# Patient Record
Sex: Female | Born: 1940 | Race: Black or African American | Hispanic: No
Health system: Southern US, Academic
[De-identification: ages and names within clinical notes are randomized; demographics above are authoritative.]

## PROBLEM LIST (undated history)

## (undated) ENCOUNTER — Encounter: Attending: Adult Health | Primary: Adult Health

## (undated) ENCOUNTER — Encounter

## (undated) ENCOUNTER — Ambulatory Visit: Payer: MEDICARE

## (undated) ENCOUNTER — Ambulatory Visit

## (undated) ENCOUNTER — Encounter: Attending: Cardiovascular Disease | Primary: Cardiovascular Disease

## (undated) ENCOUNTER — Ambulatory Visit: Payer: Medicare (Managed Care)

## (undated) ENCOUNTER — Telehealth

## (undated) ENCOUNTER — Ambulatory Visit: Attending: Clinical | Primary: Clinical

## (undated) ENCOUNTER — Ambulatory Visit: Payer: MEDICARE | Attending: Neurology | Primary: Neurology

## (undated) ENCOUNTER — Ambulatory Visit: Payer: Medicare (Managed Care) | Attending: Adult Health | Primary: Adult Health

## (undated) ENCOUNTER — Ambulatory Visit
Payer: MEDICARE | Attending: Pharmacist Clinician (PhC)/ Clinical Pharmacy Specialist | Primary: Pharmacist Clinician (PhC)/ Clinical Pharmacy Specialist

## (undated) ENCOUNTER — Ambulatory Visit: Payer: MEDICARE | Attending: Family | Primary: Family

## (undated) ENCOUNTER — Inpatient Hospital Stay

## (undated) ENCOUNTER — Encounter: Attending: Family | Primary: Family

## (undated) ENCOUNTER — Telehealth: Attending: Adult Health | Primary: Adult Health

## (undated) ENCOUNTER — Ambulatory Visit: Attending: Nurse Practitioner | Primary: Nurse Practitioner

## (undated) ENCOUNTER — Other Ambulatory Visit

## (undated) ENCOUNTER — Encounter: Attending: Internal Medicine | Primary: Internal Medicine

## (undated) ENCOUNTER — Ambulatory Visit: Payer: MEDICARE | Attending: Nephrology | Primary: Nephrology

## (undated) ENCOUNTER — Encounter
Attending: Student in an Organized Health Care Education/Training Program | Primary: Student in an Organized Health Care Education/Training Program

## (undated) ENCOUNTER — Ambulatory Visit: Payer: MEDICARE | Attending: Adult Health | Primary: Adult Health

## (undated) ENCOUNTER — Encounter: Attending: Pharmacist | Primary: Pharmacist

## (undated) ENCOUNTER — Ambulatory Visit
Payer: MEDICARE | Attending: Student in an Organized Health Care Education/Training Program | Primary: Student in an Organized Health Care Education/Training Program

## (undated) ENCOUNTER — Ambulatory Visit: Attending: Surgery | Primary: Surgery

## (undated) ENCOUNTER — Telehealth: Attending: Internal Medicine | Primary: Internal Medicine

## (undated) ENCOUNTER — Ambulatory Visit: Payer: Medicare (Managed Care) | Attending: Pulmonary Disease | Primary: Pulmonary Disease

## (undated) ENCOUNTER — Encounter: Attending: Pulmonary Disease | Primary: Pulmonary Disease

## (undated) ENCOUNTER — Encounter: Attending: Nurse Practitioner | Primary: Nurse Practitioner

## (undated) ENCOUNTER — Encounter: Attending: Mental Health | Primary: Mental Health

## (undated) ENCOUNTER — Encounter
Attending: Pharmacist Clinician (PhC)/ Clinical Pharmacy Specialist | Primary: Pharmacist Clinician (PhC)/ Clinical Pharmacy Specialist

## (undated) ENCOUNTER — Ambulatory Visit: Payer: MEDICARE | Attending: Cardiovascular Disease | Primary: Cardiovascular Disease

## (undated) ENCOUNTER — Ambulatory Visit: Payer: MEDICARE | Attending: Urology | Primary: Urology

## (undated) ENCOUNTER — Ambulatory Visit: Attending: Mental Health | Primary: Mental Health

## (undated) DIAGNOSIS — E119 Type 2 diabetes mellitus without complications: Secondary | ICD-10-CM

## (undated) DIAGNOSIS — J449 Chronic obstructive pulmonary disease, unspecified: Secondary | ICD-10-CM

## (undated) DIAGNOSIS — I5030 Unspecified diastolic (congestive) heart failure: Secondary | ICD-10-CM

## (undated) DIAGNOSIS — I10 Essential (primary) hypertension: Secondary | ICD-10-CM

## (undated) DIAGNOSIS — E785 Hyperlipidemia, unspecified: Secondary | ICD-10-CM

## (undated) DIAGNOSIS — I509 Heart failure, unspecified: Secondary | ICD-10-CM

## (undated) HISTORY — DX: Type 2 diabetes mellitus without complications: E11.9

## (undated) HISTORY — DX: Hyperlipidemia, unspecified: E78.5

## (undated) HISTORY — PX: SPINE SURGERY: SHX786

## (undated) HISTORY — DX: Essential (primary) hypertension: I10

## (undated) HISTORY — DX: Chronic obstructive pulmonary disease, unspecified: J44.9

## (undated) HISTORY — PX: ABDOMINAL HYSTERECTOMY: SHX81

## (undated) MED ORDER — ACETAMINOPHEN 500 MG TABLET: Freq: Every day | ORAL | 0.00000 days | PRN

## (undated) MED ORDER — METFORMIN 500 MG TABLET
Freq: Two times a day (BID) | ORAL | 0 days
Start: ? — End: 2021-01-29

## (undated) MED ORDER — OXYGEN-AIR DELIVERY SYSTEMS MISC: 0 days

---
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
## 2007-10-08 ENCOUNTER — Inpatient Hospital Stay: Payer: Self-pay | Admitting: Internal Medicine

## 2008-01-26 ENCOUNTER — Ambulatory Visit: Payer: Self-pay | Admitting: Internal Medicine

## 2008-03-07 ENCOUNTER — Ambulatory Visit: Payer: Self-pay | Admitting: Internal Medicine

## 2008-03-20 ENCOUNTER — Ambulatory Visit: Payer: Self-pay | Admitting: Urology

## 2008-04-10 ENCOUNTER — Ambulatory Visit: Payer: Self-pay | Admitting: Urology

## 2008-04-11 ENCOUNTER — Ambulatory Visit: Payer: Self-pay | Admitting: Urology

## 2008-04-25 ENCOUNTER — Ambulatory Visit: Payer: Self-pay | Admitting: Urology

## 2008-04-26 ENCOUNTER — Other Ambulatory Visit: Payer: Self-pay

## 2008-04-26 ENCOUNTER — Ambulatory Visit: Payer: Self-pay | Admitting: Urology

## 2008-05-10 ENCOUNTER — Ambulatory Visit: Payer: Self-pay | Admitting: Urology

## 2008-05-16 ENCOUNTER — Ambulatory Visit: Payer: Self-pay | Admitting: Urology

## 2008-10-01 ENCOUNTER — Ambulatory Visit: Payer: Self-pay | Admitting: Internal Medicine

## 2009-02-03 ENCOUNTER — Emergency Department: Payer: Self-pay | Admitting: Emergency Medicine

## 2009-02-04 ENCOUNTER — Emergency Department: Payer: Self-pay

## 2009-02-25 ENCOUNTER — Ambulatory Visit: Payer: Self-pay | Admitting: Internal Medicine

## 2009-04-04 ENCOUNTER — Emergency Department: Payer: Self-pay | Admitting: Emergency Medicine

## 2009-05-31 ENCOUNTER — Ambulatory Visit: Payer: Self-pay | Admitting: Internal Medicine

## 2009-07-04 ENCOUNTER — Ambulatory Visit: Payer: Self-pay | Admitting: Internal Medicine

## 2009-09-03 ENCOUNTER — Ambulatory Visit: Payer: Self-pay | Admitting: Internal Medicine

## 2009-09-27 ENCOUNTER — Ambulatory Visit: Payer: Self-pay | Admitting: Urology

## 2010-02-20 ENCOUNTER — Ambulatory Visit: Payer: Self-pay | Admitting: Urology

## 2010-04-11 ENCOUNTER — Emergency Department: Payer: Self-pay | Admitting: Unknown Physician Specialty

## 2010-05-23 ENCOUNTER — Other Ambulatory Visit: Payer: Self-pay | Admitting: Internal Medicine

## 2010-05-28 ENCOUNTER — Ambulatory Visit: Payer: Self-pay | Admitting: Internal Medicine

## 2010-09-23 ENCOUNTER — Ambulatory Visit: Payer: Self-pay | Admitting: General Practice

## 2010-12-30 ENCOUNTER — Ambulatory Visit: Payer: Self-pay

## 2011-04-06 ENCOUNTER — Ambulatory Visit: Payer: Self-pay | Admitting: Internal Medicine

## 2011-06-08 ENCOUNTER — Ambulatory Visit: Payer: Self-pay

## 2011-09-09 ENCOUNTER — Ambulatory Visit: Payer: Self-pay | Admitting: Family Medicine

## 2011-09-14 ENCOUNTER — Emergency Department: Payer: Self-pay | Admitting: Emergency Medicine

## 2012-04-15 ENCOUNTER — Ambulatory Visit: Payer: Self-pay | Admitting: Internal Medicine

## 2012-05-20 ENCOUNTER — Emergency Department: Payer: Self-pay | Admitting: Unknown Physician Specialty

## 2012-05-20 LAB — CBC
HCT: 42.6 % (ref 35.0–47.0)
HGB: 13.7 g/dL (ref 12.0–16.0)
MCH: 30.3 pg (ref 26.0–34.0)
MCHC: 32.2 g/dL (ref 32.0–36.0)
MCV: 94 fL (ref 80–100)
Platelet: 204 10*3/uL (ref 150–440)
WBC: 12.4 10*3/uL — ABNORMAL HIGH (ref 3.6–11.0)

## 2012-05-20 LAB — COMPREHENSIVE METABOLIC PANEL
Albumin: 3.5 g/dL (ref 3.4–5.0)
Alkaline Phosphatase: 54 U/L (ref 50–136)
BUN: 18 mg/dL (ref 7–18)
Bilirubin,Total: 0.4 mg/dL (ref 0.2–1.0)
Chloride: 105 mmol/L (ref 98–107)
Co2: 27 mmol/L (ref 21–32)
Creatinine: 0.59 mg/dL — ABNORMAL LOW (ref 0.60–1.30)
EGFR (African American): >60
Glucose: 182 mg/dL — ABNORMAL HIGH (ref 65–99)
SGOT(AST): 23 U/L (ref 15–37)
Total Protein: 7.1 g/dL (ref 6.4–8.2)

## 2012-05-20 LAB — TROPONIN I: Troponin-I: <0.02 ng/mL

## 2012-05-21 LAB — URINALYSIS, COMPLETE
Glucose,UR: NEGATIVE mg/dL (ref 0–75)
Nitrite: NEGATIVE
Ph: 7 (ref 4.5–8.0)
Protein: NEGATIVE
RBC,UR: 1 /HPF (ref 0–5)
Specific Gravity: 1.006 (ref 1.003–1.030)
Squamous Epithelial: <1

## 2012-09-15 ENCOUNTER — Emergency Department: Payer: Self-pay | Admitting: Emergency Medicine

## 2012-09-22 ENCOUNTER — Ambulatory Visit: Payer: Self-pay

## 2012-09-22 LAB — URINALYSIS, COMPLETE
Nitrite: POSITIVE
Ph: 6 (ref 4.5–8.0)
Protein: NEGATIVE
Specific Gravity: 1.015 (ref 1.003–1.030)
WBC UR: >30 /HPF (ref 0–5)

## 2012-09-24 LAB — URINE CULTURE

## 2012-10-03 ENCOUNTER — Ambulatory Visit: Payer: Self-pay

## 2013-01-02 ENCOUNTER — Inpatient Hospital Stay: Payer: Self-pay | Admitting: Internal Medicine

## 2013-01-02 LAB — COMPREHENSIVE METABOLIC PANEL
Albumin: 3.4 g/dL (ref 3.4–5.0)
Alkaline Phosphatase: 69 U/L (ref 50–136)
Anion Gap: 4 — ABNORMAL LOW (ref 7–16)
BUN: 21 mg/dL — ABNORMAL HIGH (ref 7–18)
Calcium, Total: 8.3 mg/dL — ABNORMAL LOW (ref 8.5–10.1)
Chloride: 100 mmol/L (ref 98–107)
Co2: 31 mmol/L (ref 21–32)
EGFR (African American): >60
EGFR (Non-African Amer.): >60
Glucose: 202 mg/dL — ABNORMAL HIGH (ref 65–99)
Osmolality: 279 (ref 275–301)
SGOT(AST): 22 U/L (ref 15–37)

## 2013-01-02 LAB — CBC
HGB: 13.3 g/dL (ref 12.0–16.0)
MCH: 29.8 pg (ref 26.0–34.0)
Platelet: 124 10*3/uL — ABNORMAL LOW (ref 150–440)
RBC: 4.47 10*6/uL (ref 3.80–5.20)
RDW: 13.4 % (ref 11.5–14.5)

## 2013-01-02 LAB — URINALYSIS, COMPLETE
Glucose,UR: NEGATIVE mg/dL (ref 0–75)
Hyaline Cast: 4
Leukocyte Esterase: NEGATIVE
Protein: 30
RBC,UR: 1 /HPF (ref 0–5)
WBC UR: 2 /HPF (ref 0–5)

## 2013-01-02 LAB — RAPID INFLUENZA A&B ANTIGENS

## 2013-01-03 LAB — CBC WITH DIFFERENTIAL/PLATELET
Basophil %: 0.4 %
Eosinophil %: 0 %
Lymphocyte #: 0.5 10*3/uL — ABNORMAL LOW (ref 1.0–3.6)
Lymphocyte %: 6 %
MCHC: 32.7 g/dL (ref 32.0–36.0)
MCV: 93 fL (ref 80–100)
Monocyte #: 0.2 x10 3/mm (ref 0.2–0.9)
Monocyte %: 2 %
Neutrophil %: 91.6 %
RBC: 4.03 10*6/uL (ref 3.80–5.20)
WBC: 7.9 10*3/uL (ref 3.6–11.0)

## 2013-01-03 LAB — BASIC METABOLIC PANEL
Anion Gap: 5 — ABNORMAL LOW (ref 7–16)
Calcium, Total: 8.5 mg/dL (ref 8.5–10.1)
Chloride: 98 mmol/L (ref 98–107)
EGFR (African American): 52 — ABNORMAL LOW
Glucose: 234 mg/dL — ABNORMAL HIGH (ref 65–99)
Osmolality: 283 (ref 275–301)
Potassium: 4.7 mmol/L (ref 3.5–5.1)
Sodium: 134 mmol/L — ABNORMAL LOW (ref 136–145)

## 2013-01-03 LAB — TROPONIN I: Troponin-I: <0.02 ng/mL

## 2013-01-03 LAB — LIPID PANEL
Cholesterol: 160 mg/dL (ref 0–200)
Triglycerides: 68 mg/dL (ref 0–200)

## 2013-01-05 LAB — BASIC METABOLIC PANEL
Anion Gap: 5 — ABNORMAL LOW (ref 7–16)
BUN: 26 mg/dL — ABNORMAL HIGH (ref 7–18)
Chloride: 104 mmol/L (ref 98–107)
Co2: 31 mmol/L (ref 21–32)
EGFR (African American): >60
Glucose: 125 mg/dL — ABNORMAL HIGH (ref 65–99)

## 2013-01-06 LAB — BASIC METABOLIC PANEL
Anion Gap: 4 — ABNORMAL LOW (ref 7–16)
Calcium, Total: 8.5 mg/dL (ref 8.5–10.1)
Co2: 33 mmol/L — ABNORMAL HIGH (ref 21–32)
Creatinine: 0.73 mg/dL (ref 0.60–1.30)
Glucose: 85 mg/dL (ref 65–99)
Potassium: 3.9 mmol/L (ref 3.5–5.1)

## 2013-01-08 LAB — CULTURE, BLOOD (SINGLE)

## 2013-03-20 ENCOUNTER — Ambulatory Visit: Payer: Self-pay | Admitting: Family Medicine

## 2013-04-11 ENCOUNTER — Ambulatory Visit: Payer: Self-pay | Admitting: Orthopedic Surgery

## 2013-05-26 ENCOUNTER — Ambulatory Visit: Payer: Self-pay | Admitting: Orthopedic Surgery

## 2013-10-02 ENCOUNTER — Emergency Department: Payer: Self-pay | Admitting: Emergency Medicine

## 2013-10-02 LAB — COMPREHENSIVE METABOLIC PANEL
Albumin: 3.5 g/dL (ref 3.4–5.0)
Alkaline Phosphatase: 79 U/L (ref 50–136)
Anion Gap: 3 — ABNORMAL LOW (ref 7–16)
BUN: 18 mg/dL (ref 7–18)
Bilirubin,Total: 0.5 mg/dL (ref 0.2–1.0)
Calcium, Total: 9.4 mg/dL (ref 8.5–10.1)
Chloride: 104 mmol/L (ref 98–107)
Co2: 31 mmol/L (ref 21–32)
Creatinine: 1.04 mg/dL (ref 0.60–1.30)
Glucose: 159 mg/dL — ABNORMAL HIGH (ref 65–99)
Potassium: 3.8 mmol/L (ref 3.5–5.1)

## 2013-10-02 LAB — CBC
Platelet: 218 10*3/uL (ref 150–440)
RBC: 4.63 10*6/uL (ref 3.80–5.20)
RDW: 13.5 % (ref 11.5–14.5)
WBC: 12.9 10*3/uL — ABNORMAL HIGH (ref 3.6–11.0)

## 2013-10-02 LAB — TROPONIN I: Troponin-I: <0.02 ng/mL

## 2013-10-02 LAB — CK TOTAL AND CKMB (NOT AT ARMC): CK-MB: <0.5 ng/mL — ABNORMAL LOW (ref 0.5–3.6)

## 2013-10-20 ENCOUNTER — Ambulatory Visit: Payer: Self-pay | Admitting: Family Medicine

## 2014-02-11 ENCOUNTER — Ambulatory Visit: Payer: Self-pay | Admitting: Family Medicine

## 2014-03-13 DIAGNOSIS — I200 Unstable angina: Secondary | ICD-10-CM | POA: Insufficient documentation

## 2014-05-11 DIAGNOSIS — M47817 Spondylosis without myelopathy or radiculopathy, lumbosacral region: Secondary | ICD-10-CM | POA: Insufficient documentation

## 2014-05-11 DIAGNOSIS — M5116 Intervertebral disc disorders with radiculopathy, lumbar region: Secondary | ICD-10-CM | POA: Insufficient documentation

## 2014-05-11 DIAGNOSIS — M5414 Radiculopathy, thoracic region: Secondary | ICD-10-CM | POA: Insufficient documentation

## 2014-05-11 DIAGNOSIS — M47816 Spondylosis without myelopathy or radiculopathy, lumbar region: Secondary | ICD-10-CM | POA: Insufficient documentation

## 2014-05-11 DIAGNOSIS — M48061 Spinal stenosis, lumbar region without neurogenic claudication: Secondary | ICD-10-CM | POA: Insufficient documentation

## 2014-05-11 DIAGNOSIS — M5417 Radiculopathy, lumbosacral region: Secondary | ICD-10-CM

## 2014-06-08 ENCOUNTER — Ambulatory Visit: Payer: Self-pay | Admitting: Family Medicine

## 2014-06-08 LAB — URINALYSIS, COMPLETE
BILIRUBIN, UR: NEGATIVE
Blood: NEGATIVE
Glucose,UR: NEGATIVE mg/dL (ref 0–75)
Ketone: NEGATIVE
LEUKOCYTE ESTERASE: NEGATIVE
Nitrite: NEGATIVE
PH: 6.5 (ref 4.5–8.0)
PROTEIN: NEGATIVE
Specific Gravity: 1.01 (ref 1.003–1.030)

## 2014-06-08 LAB — WET PREP, GENITAL

## 2014-06-14 ENCOUNTER — Ambulatory Visit: Payer: Self-pay | Admitting: Urgent Care

## 2014-06-14 LAB — COMPREHENSIVE METABOLIC PANEL
ALBUMIN: 3.7 g/dL (ref 3.4–5.0)
ALK PHOS: 70 U/L
ALT: 16 U/L (ref 12–78)
ANION GAP: 5 — AB (ref 7–16)
BILIRUBIN TOTAL: 0.4 mg/dL (ref 0.2–1.0)
BUN: 23 mg/dL — ABNORMAL HIGH (ref 7–18)
CHLORIDE: 104 mmol/L (ref 98–107)
Calcium, Total: 9.1 mg/dL (ref 8.5–10.1)
Co2: 30 mmol/L (ref 21–32)
Creatinine: 1 mg/dL (ref 0.60–1.30)
EGFR (African American): >60
EGFR (Non-African Amer.): 56 — ABNORMAL LOW
GLUCOSE: 99 mg/dL (ref 65–99)
Osmolality: 281 (ref 275–301)
Potassium: 4 mmol/L (ref 3.5–5.1)
SGOT(AST): 22 U/L (ref 15–37)
SODIUM: 139 mmol/L (ref 136–145)
Total Protein: 7.7 g/dL (ref 6.4–8.2)

## 2014-06-14 LAB — CBC WITH DIFFERENTIAL/PLATELET
Basophil #: 0.1 10*3/uL (ref 0.0–0.1)
Basophil %: 1.1 %
EOS ABS: 0.5 10*3/uL (ref 0.0–0.7)
EOS PCT: 4.2 %
HCT: 41.4 % (ref 35.0–47.0)
HGB: 13.5 g/dL (ref 12.0–16.0)
LYMPHS ABS: 3.1 10*3/uL (ref 1.0–3.6)
LYMPHS PCT: 24.7 %
MCH: 30.3 pg (ref 26.0–34.0)
MCHC: 32.7 g/dL (ref 32.0–36.0)
MCV: 93 fL (ref 80–100)
MONOS PCT: 7.2 %
Monocyte #: 0.9 x10 3/mm (ref 0.2–0.9)
NEUTROS PCT: 62.8 %
Neutrophil #: 7.8 10*3/uL — ABNORMAL HIGH (ref 1.4–6.5)
Platelet: 198 10*3/uL (ref 150–440)
RBC: 4.47 10*6/uL (ref 3.80–5.20)
RDW: 13.4 % (ref 11.5–14.5)
WBC: 12.4 10*3/uL — ABNORMAL HIGH (ref 3.6–11.0)

## 2014-06-14 LAB — LIPASE, BLOOD: Lipase: 280 U/L (ref 73–393)

## 2014-06-15 ENCOUNTER — Emergency Department: Payer: Self-pay | Admitting: Emergency Medicine

## 2014-06-15 LAB — URINALYSIS, COMPLETE
BLOOD: NEGATIVE
Bilirubin,UR: NEGATIVE
Glucose,UR: NEGATIVE mg/dL (ref 0–75)
Hyaline Cast: 16
KETONE: NEGATIVE
Nitrite: NEGATIVE
Ph: 5 (ref 4.5–8.0)
Protein: NEGATIVE
SPECIFIC GRAVITY: 1.011 (ref 1.003–1.030)
Squamous Epithelial: 2
WBC UR: 1 /HPF (ref 0–5)

## 2014-06-16 LAB — URINE CULTURE

## 2014-06-17 LAB — URINE CULTURE

## 2014-07-14 ENCOUNTER — Emergency Department: Payer: Self-pay | Admitting: Emergency Medicine

## 2014-07-14 LAB — COMPREHENSIVE METABOLIC PANEL
ALBUMIN: 3.2 g/dL — AB (ref 3.4–5.0)
AST: 18 U/L (ref 15–37)
Alkaline Phosphatase: 66 U/L
Anion Gap: 6 — ABNORMAL LOW (ref 7–16)
BUN: 26 mg/dL — ABNORMAL HIGH (ref 7–18)
Bilirubin,Total: 1 mg/dL (ref 0.2–1.0)
CALCIUM: 8.7 mg/dL (ref 8.5–10.1)
Chloride: 104 mmol/L (ref 98–107)
Co2: 28 mmol/L (ref 21–32)
Creatinine: 1.68 mg/dL — ABNORMAL HIGH (ref 0.60–1.30)
EGFR (African American): 35 — ABNORMAL LOW
EGFR (Non-African Amer.): 30 — ABNORMAL LOW
Glucose: 194 mg/dL — ABNORMAL HIGH (ref 65–99)
OSMOLALITY: 286 (ref 275–301)
Potassium: 3.6 mmol/L (ref 3.5–5.1)
SGPT (ALT): 19 U/L (ref 12–78)
Sodium: 138 mmol/L (ref 136–145)
TOTAL PROTEIN: 7.4 g/dL (ref 6.4–8.2)

## 2014-07-14 LAB — URINALYSIS, COMPLETE
BILIRUBIN, UR: NEGATIVE
GLUCOSE, UR: NEGATIVE mg/dL (ref 0–75)
Ketone: NEGATIVE
Nitrite: POSITIVE
PH: 5 (ref 4.5–8.0)
RBC,UR: 13 /HPF (ref 0–5)
Specific Gravity: 1.013 (ref 1.003–1.030)
Squamous Epithelial: 4
WBC UR: 306 /HPF (ref 0–5)

## 2014-07-14 LAB — CBC
HCT: 39.6 % (ref 35.0–47.0)
HGB: 12.6 g/dL (ref 12.0–16.0)
MCH: 29.2 pg (ref 26.0–34.0)
MCHC: 31.8 g/dL — ABNORMAL LOW (ref 32.0–36.0)
MCV: 92 fL (ref 80–100)
Platelet: 158 10*3/uL (ref 150–440)
RBC: 4.32 10*6/uL (ref 3.80–5.20)
RDW: 13.2 % (ref 11.5–14.5)
WBC: 14 10*3/uL — ABNORMAL HIGH (ref 3.6–11.0)

## 2014-07-14 LAB — TROPONIN I: Troponin-I: <0.02 ng/mL

## 2014-07-14 LAB — TSH: THYROID STIMULATING HORM: 0.87 u[IU]/mL

## 2014-07-17 LAB — URINE CULTURE

## 2014-08-01 DIAGNOSIS — M5136 Other intervertebral disc degeneration, lumbar region: Secondary | ICD-10-CM | POA: Insufficient documentation

## 2014-09-19 ENCOUNTER — Emergency Department: Payer: Self-pay | Admitting: Emergency Medicine

## 2014-09-19 LAB — URINALYSIS, COMPLETE
BILIRUBIN, UR: NEGATIVE
Blood: NEGATIVE
Glucose,UR: NEGATIVE mg/dL (ref 0–75)
KETONE: NEGATIVE
LEUKOCYTE ESTERASE: NEGATIVE
NITRITE: POSITIVE
PH: 7 (ref 4.5–8.0)
Protein: NEGATIVE
RBC,UR: 11 /HPF (ref 0–5)
SPECIFIC GRAVITY: 1.017 (ref 1.003–1.030)
Squamous Epithelial: 2

## 2014-09-19 LAB — COMPREHENSIVE METABOLIC PANEL
ALBUMIN: 3.3 g/dL — AB (ref 3.4–5.0)
Alkaline Phosphatase: 80 U/L
Anion Gap: 3 — ABNORMAL LOW (ref 7–16)
BUN: 25 mg/dL — AB (ref 7–18)
Bilirubin,Total: 0.4 mg/dL (ref 0.2–1.0)
CALCIUM: 8.7 mg/dL (ref 8.5–10.1)
CHLORIDE: 103 mmol/L (ref 98–107)
CREATININE: 1.16 mg/dL (ref 0.60–1.30)
Co2: 32 mmol/L (ref 21–32)
EGFR (African American): 59 — ABNORMAL LOW
GFR CALC NON AF AMER: 49 — AB
Glucose: 208 mg/dL — ABNORMAL HIGH (ref 65–99)
OSMOLALITY: 286 (ref 275–301)
Potassium: 3.9 mmol/L (ref 3.5–5.1)
SGOT(AST): 12 U/L — ABNORMAL LOW (ref 15–37)
SGPT (ALT): 16 U/L
SODIUM: 138 mmol/L (ref 136–145)
Total Protein: 7.3 g/dL (ref 6.4–8.2)

## 2014-09-19 LAB — CBC
HCT: 42.5 % (ref 35.0–47.0)
HGB: 13.7 g/dL (ref 12.0–16.0)
MCH: 29.3 pg (ref 26.0–34.0)
MCHC: 32.2 g/dL (ref 32.0–36.0)
MCV: 91 fL (ref 80–100)
PLATELETS: 201 10*3/uL (ref 150–440)
RBC: 4.67 10*6/uL (ref 3.80–5.20)
RDW: 13.4 % (ref 11.5–14.5)
WBC: 12.6 10*3/uL — ABNORMAL HIGH (ref 3.6–11.0)

## 2014-09-19 LAB — LIPASE, BLOOD: LIPASE: 244 U/L (ref 73–393)

## 2014-09-21 LAB — URINE CULTURE

## 2014-12-31 DIAGNOSIS — D485 Neoplasm of uncertain behavior of skin: Secondary | ICD-10-CM | POA: Diagnosis not present

## 2014-12-31 DIAGNOSIS — I96 Gangrene, not elsewhere classified: Secondary | ICD-10-CM | POA: Diagnosis not present

## 2015-01-15 DIAGNOSIS — M4807 Spinal stenosis, lumbosacral region: Secondary | ICD-10-CM | POA: Diagnosis not present

## 2015-01-15 DIAGNOSIS — M47816 Spondylosis without myelopathy or radiculopathy, lumbar region: Secondary | ICD-10-CM | POA: Diagnosis not present

## 2015-01-15 DIAGNOSIS — N1330 Unspecified hydronephrosis: Secondary | ICD-10-CM | POA: Diagnosis not present

## 2015-01-15 DIAGNOSIS — R29890 Loss of height: Secondary | ICD-10-CM | POA: Diagnosis not present

## 2015-01-15 DIAGNOSIS — M4317 Spondylolisthesis, lumbosacral region: Secondary | ICD-10-CM | POA: Diagnosis not present

## 2015-01-15 DIAGNOSIS — M4806 Spinal stenosis, lumbar region: Secondary | ICD-10-CM | POA: Diagnosis not present

## 2015-01-16 DIAGNOSIS — I10 Essential (primary) hypertension: Secondary | ICD-10-CM | POA: Diagnosis not present

## 2015-01-16 DIAGNOSIS — E119 Type 2 diabetes mellitus without complications: Secondary | ICD-10-CM | POA: Diagnosis not present

## 2015-01-16 DIAGNOSIS — S32010A Wedge compression fracture of first lumbar vertebra, initial encounter for closed fracture: Secondary | ICD-10-CM | POA: Diagnosis not present

## 2015-01-16 DIAGNOSIS — M5136 Other intervertebral disc degeneration, lumbar region: Secondary | ICD-10-CM | POA: Diagnosis not present

## 2015-01-16 DIAGNOSIS — M4856XA Collapsed vertebra, not elsewhere classified, lumbar region, initial encounter for fracture: Secondary | ICD-10-CM | POA: Diagnosis not present

## 2015-01-16 DIAGNOSIS — J449 Chronic obstructive pulmonary disease, unspecified: Secondary | ICD-10-CM | POA: Diagnosis not present

## 2015-01-16 DIAGNOSIS — Z6837 Body mass index (BMI) 37.0-37.9, adult: Secondary | ICD-10-CM | POA: Diagnosis not present

## 2015-01-16 DIAGNOSIS — M4807 Spinal stenosis, lumbosacral region: Secondary | ICD-10-CM | POA: Diagnosis not present

## 2015-01-16 DIAGNOSIS — M4317 Spondylolisthesis, lumbosacral region: Secondary | ICD-10-CM | POA: Diagnosis not present

## 2015-01-20 DIAGNOSIS — J449 Chronic obstructive pulmonary disease, unspecified: Secondary | ICD-10-CM | POA: Diagnosis not present

## 2015-01-25 DIAGNOSIS — Z1389 Encounter for screening for other disorder: Secondary | ICD-10-CM | POA: Diagnosis not present

## 2015-01-25 DIAGNOSIS — M549 Dorsalgia, unspecified: Secondary | ICD-10-CM | POA: Diagnosis not present

## 2015-01-25 DIAGNOSIS — Z9181 History of falling: Secondary | ICD-10-CM | POA: Diagnosis not present

## 2015-01-25 DIAGNOSIS — I10 Essential (primary) hypertension: Secondary | ICD-10-CM | POA: Diagnosis not present

## 2015-02-04 DIAGNOSIS — M545 Low back pain: Secondary | ICD-10-CM | POA: Diagnosis not present

## 2015-02-04 DIAGNOSIS — M5430 Sciatica, unspecified side: Secondary | ICD-10-CM | POA: Diagnosis not present

## 2015-02-04 DIAGNOSIS — M150 Primary generalized (osteo)arthritis: Secondary | ICD-10-CM | POA: Diagnosis not present

## 2015-02-04 DIAGNOSIS — F1120 Opioid dependence, uncomplicated: Secondary | ICD-10-CM | POA: Diagnosis not present

## 2015-02-04 DIAGNOSIS — E119 Type 2 diabetes mellitus without complications: Secondary | ICD-10-CM | POA: Diagnosis not present

## 2015-02-04 DIAGNOSIS — F17290 Nicotine dependence, other tobacco product, uncomplicated: Secondary | ICD-10-CM | POA: Diagnosis not present

## 2015-02-04 DIAGNOSIS — G8929 Other chronic pain: Secondary | ICD-10-CM | POA: Diagnosis not present

## 2015-02-04 DIAGNOSIS — M5418 Radiculopathy, sacral and sacrococcygeal region: Secondary | ICD-10-CM | POA: Diagnosis not present

## 2015-02-20 DIAGNOSIS — J449 Chronic obstructive pulmonary disease, unspecified: Secondary | ICD-10-CM | POA: Diagnosis not present

## 2015-03-01 DIAGNOSIS — J449 Chronic obstructive pulmonary disease, unspecified: Secondary | ICD-10-CM | POA: Diagnosis not present

## 2015-03-01 DIAGNOSIS — G5791 Unspecified mononeuropathy of right lower limb: Secondary | ICD-10-CM | POA: Diagnosis not present

## 2015-03-01 DIAGNOSIS — F329 Major depressive disorder, single episode, unspecified: Secondary | ICD-10-CM | POA: Diagnosis not present

## 2015-03-01 DIAGNOSIS — I10 Essential (primary) hypertension: Secondary | ICD-10-CM | POA: Diagnosis not present

## 2015-03-01 DIAGNOSIS — J302 Other seasonal allergic rhinitis: Secondary | ICD-10-CM | POA: Diagnosis not present

## 2015-03-01 DIAGNOSIS — B001 Herpesviral vesicular dermatitis: Secondary | ICD-10-CM | POA: Diagnosis not present

## 2015-03-01 DIAGNOSIS — M1990 Unspecified osteoarthritis, unspecified site: Secondary | ICD-10-CM | POA: Diagnosis not present

## 2015-03-01 DIAGNOSIS — E119 Type 2 diabetes mellitus without complications: Secondary | ICD-10-CM | POA: Diagnosis not present

## 2015-03-21 DIAGNOSIS — J449 Chronic obstructive pulmonary disease, unspecified: Secondary | ICD-10-CM | POA: Diagnosis not present

## 2015-04-13 DIAGNOSIS — Z8249 Family history of ischemic heart disease and other diseases of the circulatory system: Secondary | ICD-10-CM | POA: Diagnosis not present

## 2015-04-13 DIAGNOSIS — Z8739 Personal history of other diseases of the musculoskeletal system and connective tissue: Secondary | ICD-10-CM | POA: Diagnosis not present

## 2015-04-13 DIAGNOSIS — I10 Essential (primary) hypertension: Secondary | ICD-10-CM | POA: Diagnosis not present

## 2015-04-13 DIAGNOSIS — R5383 Other fatigue: Secondary | ICD-10-CM | POA: Diagnosis not present

## 2015-04-13 DIAGNOSIS — R600 Localized edema: Secondary | ICD-10-CM | POA: Diagnosis not present

## 2015-04-13 DIAGNOSIS — N179 Acute kidney failure, unspecified: Secondary | ICD-10-CM | POA: Diagnosis not present

## 2015-04-13 DIAGNOSIS — J440 Chronic obstructive pulmonary disease with acute lower respiratory infection: Secondary | ICD-10-CM | POA: Diagnosis not present

## 2015-04-13 DIAGNOSIS — Z87891 Personal history of nicotine dependence: Secondary | ICD-10-CM | POA: Diagnosis not present

## 2015-04-13 DIAGNOSIS — N390 Urinary tract infection, site not specified: Secondary | ICD-10-CM | POA: Diagnosis not present

## 2015-04-13 DIAGNOSIS — G4730 Sleep apnea, unspecified: Secondary | ICD-10-CM | POA: Diagnosis not present

## 2015-04-13 DIAGNOSIS — R0902 Hypoxemia: Secondary | ICD-10-CM | POA: Diagnosis not present

## 2015-04-13 DIAGNOSIS — J9610 Chronic respiratory failure, unspecified whether with hypoxia or hypercapnia: Secondary | ICD-10-CM | POA: Diagnosis not present

## 2015-04-13 DIAGNOSIS — M1990 Unspecified osteoarthritis, unspecified site: Secondary | ICD-10-CM | POA: Diagnosis not present

## 2015-04-13 DIAGNOSIS — R0600 Dyspnea, unspecified: Secondary | ICD-10-CM | POA: Diagnosis not present

## 2015-04-13 DIAGNOSIS — R739 Hyperglycemia, unspecified: Secondary | ICD-10-CM | POA: Diagnosis not present

## 2015-04-13 DIAGNOSIS — J449 Chronic obstructive pulmonary disease, unspecified: Secondary | ICD-10-CM | POA: Diagnosis not present

## 2015-04-13 DIAGNOSIS — J441 Chronic obstructive pulmonary disease with (acute) exacerbation: Secondary | ICD-10-CM | POA: Diagnosis not present

## 2015-04-14 DIAGNOSIS — G4730 Sleep apnea, unspecified: Secondary | ICD-10-CM | POA: Insufficient documentation

## 2015-04-14 DIAGNOSIS — I10 Essential (primary) hypertension: Secondary | ICD-10-CM | POA: Insufficient documentation

## 2015-04-14 DIAGNOSIS — R739 Hyperglycemia, unspecified: Secondary | ICD-10-CM | POA: Insufficient documentation

## 2015-04-14 DIAGNOSIS — J449 Chronic obstructive pulmonary disease, unspecified: Secondary | ICD-10-CM | POA: Insufficient documentation

## 2015-04-14 DIAGNOSIS — S37009A Unspecified injury of unspecified kidney, initial encounter: Secondary | ICD-10-CM | POA: Insufficient documentation

## 2015-04-16 DIAGNOSIS — I10 Essential (primary) hypertension: Secondary | ICD-10-CM | POA: Diagnosis not present

## 2015-04-16 DIAGNOSIS — E119 Type 2 diabetes mellitus without complications: Secondary | ICD-10-CM | POA: Diagnosis not present

## 2015-04-16 DIAGNOSIS — R300 Dysuria: Secondary | ICD-10-CM | POA: Diagnosis not present

## 2015-04-16 DIAGNOSIS — M1990 Unspecified osteoarthritis, unspecified site: Secondary | ICD-10-CM | POA: Diagnosis not present

## 2015-04-16 DIAGNOSIS — Z9181 History of falling: Secondary | ICD-10-CM | POA: Diagnosis not present

## 2015-04-16 DIAGNOSIS — F329 Major depressive disorder, single episode, unspecified: Secondary | ICD-10-CM | POA: Diagnosis not present

## 2015-04-16 DIAGNOSIS — G5791 Unspecified mononeuropathy of right lower limb: Secondary | ICD-10-CM | POA: Diagnosis not present

## 2015-04-16 DIAGNOSIS — J449 Chronic obstructive pulmonary disease, unspecified: Secondary | ICD-10-CM | POA: Diagnosis not present

## 2015-04-19 DIAGNOSIS — I509 Heart failure, unspecified: Secondary | ICD-10-CM | POA: Diagnosis not present

## 2015-04-19 DIAGNOSIS — R0602 Shortness of breath: Secondary | ICD-10-CM | POA: Diagnosis not present

## 2015-04-19 DIAGNOSIS — R0902 Hypoxemia: Secondary | ICD-10-CM | POA: Diagnosis not present

## 2015-04-19 DIAGNOSIS — R011 Cardiac murmur, unspecified: Secondary | ICD-10-CM | POA: Diagnosis not present

## 2015-04-19 NOTE — Op Note (Signed)
PATIENT NAME:  Megan Bolton, Megan Bolton MR#:  [id number] DATE OF BIRTH:  05/29/1941  DATE OF PROCEDURE:  05/26/2013  PREOPERATIVE DIAGNOSES: Left carpal tunnel syndrome and left olecranon bursitis.   POSTOPERATIVE DIAGNOSES: Left carpal tunnel syndrome and left olecranon bursitis.   PROCEDURE: Left carpal tunnel release and left olecranon bursa excision.   ANESTHESIA: General.  SURGEON: Laurene Footman, MD   DESCRIPTION OF PROCEDURE: The patient was brought to the operating room, and after adequate anesthesia was obtained, the left arm was prepped and draped in the usual sterile fashion with a tourniquet applied to the upper arm. After patient identification and timeout procedures were completed, the tourniquet was raised to 250 mmHg. An incision was made in line with the ring metacarpal, approximately 2.5 cm in length, extending from the distal wrist flexion crease. The subcutaneous tissue was spread, and the transverse carpal ligament identified and appeared thickened. This was incised, and the transverse carpal ligament released distally until there was fat noted around the nerve consistent with lack of compression. Going proximally, there was compression at the level of the wrist flexion crease. There was some indentation to the median nerve at this level. After release, there was good vascular blush. Examination of the carpal tunnel revealed no masses. There was moderate flexor tenosynovitis. This wound was irrigated and then closed with simple interrupted 5-0 nylon skin suture. The left olecranon bursa was then approached with an incision to the radial side of the olecranon approximately 4 cm in length. The skin and subcutaneous tissue was divided, and there was extensive bursal tissue present along with a large amount of fluid. The thick bursal tissue was debrided along with granulation tissue that was adherent to the olecranon and surface of the ulna. After adequate debridement of this tissue, the wound  was thoroughly irrigated. A JP drain was placed, and the wound closed with simple interrupted 4-0 nylon skin sutures. Xeroform, 4 x 4's, Webril and Ace wrap were applied to both incisions, wrapping from hand to above the elbow to hopefully minimize postoperative edema. The patient was sent to the recovery room in stable condition.   ESTIMATED BLOOD LOSS: Minimal.   COMPLICATIONS: None.   SPECIMEN: None.   TOURNIQUET TIME: 34 minutes at 250 mmHg.   ____________________________ Laurene Footman, MD mjm:OSi D: 05/26/2013 21:21:59 ET T: 05/27/2013 08:42:17 ET JOB#: [id number]  cc: Laurene Footman, MD, <Dictator> Laurene Footman MD ELECTRONICALLY SIGNED 05/27/2013 21:37

## 2015-04-19 NOTE — Discharge Summary (Signed)
PATIENT NAME:  Megan Bolton, Megan Bolton MR#:  [id number] DATE OF BIRTH:  1941/05/23  DATE OF ADMISSION:  01/02/2013 DATE OF DISCHARGE:  01/06/2013  PRIMARY CARE PHYSICIAN: Lavera Guise, MD  DISCHARGE DIAGNOSES:  1. Acute on chronic respiratory failure.  2. Chronic obstructive pulmonary exacerbation. 3. Influenza A.  3. Systemic inflammatory response syndrome. 4. Dehydration. 5. Hypertension.  6. Diabetes.   CONDITION: Stable.   CODE STATUS: Full code.   MEDICATIONS: Please refer to the medication reconciliation list in [facility] physician discharge instructions. The patient needs home health with nursing and PT.   DIET: Low sodium, low fat, low cholesterol, ADA diet.   ACTIVITY: As tolerated.   FOLLOWUP CARE: Follow-up with PCP within 1 to 2 weeks. The patient needs CPAP at night.   REASON FOR ADMISSION: Shortness of breath.   HOSPITAL COURSE: The patient is a 74 year old female with a history of hypertension, COPD, diabetes, OSA, presenting in ED with shortness of breath for several days with yellowish phlegm. Her oxygen dropped. She was treated with BiPAP in the ED and was found to have a positive influenza A. She had a fever of 103 and elevated white count, so she was admitted for COPD exacerbation, SIRS with influenza A. After admission, the patient was treated with IV Solu-Medrol, Levaquin, nebulizer, Tamiflu. Symptoms have much improved after aforementioned treatment. In addition, the patient's diabetes has been treated with sliding scale plus metformin. Hypertension has been controlled with hypertension medication. For OSA, the patient has been treated with CPAP at night. Briefly, the patient is clinically stable. She will be discharged to home with nursing and physical therapy. I discussed the patient's discharge plan with the patient, the patient's daughter, case Freight forwarder.   TIME SPENT: About 36 minutes.    ____________________________ Demetrios Loll, MD qc:OSi D: 01/06/2013 14:26:18  ET T: 01/07/2013 07:59:00 ET JOB#: [id number]  cc: Demetrios Loll, MD, <Dictator> Demetrios Loll MD ELECTRONICALLY SIGNED 01/09/2013 15:25

## 2015-04-19 NOTE — H&P (Signed)
PATIENT NAME:  Megan Bolton, Megan Bolton MR#:  [id number] DATE OF BIRTH:  Aug 10, 1941  DATE OF ADMISSION:  01/02/2013  PRIMARY CARE PHYSICIAN: Dr. Clayborn Bigness   CHIEF COMPLAINT: Shortness of breath.   HISTORY OF PRESENT ILLNESS: This is a 74 year old female with hypertension, COPD, diabetes, sleep apnea. She presents to the ER with trouble breathing. It has gone for a few days but worsened on Saturday. She felt like she was catching a cold. She has been having fevers, coughing up yellow phlegm, and her oxygen level dropped. In the ER, she was in respiratory failure and started on BiPAP. She was found to have a positive influenza A, an elevated white count, a fever of 103 and hypoxia, and hospitalist services were contacted for evaluation. The patient is difficult to understand for a history secondary to BiPAP.   PAST MEDICAL HISTORY:  Hypertension, COPD, diabetes, sleep apnea with CPAP at night and oxygen at night.   PAST SURGICAL HISTORY:  Kidney stones, hysterectomy and back surgery.   ALLERGIES: No known drug allergies.   MEDICATIONS: As per prescription writer include Advair HFA, alprazolam, Coreg 25 mg twice a day, Celexa 20 mg daily, indapamide 1.25 mg daily, lisinopril 40 mg daily, losartan 100 mg daily, metformin 500 mg twice a day, Naprosyn 500 mg twice a day as needed, potassium chloride 10 mEq twice a day, ProAir HFA orally, Singulair 10 mg at bedtime.   SOCIAL HISTORY: Smokes, 1 pack lasts her 2 days. No alcohol. No drug use. She lives with her daughter.  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   FAMILY HISTORY: Father died at 63 of rectal cancer. Mother died of hypertension  complications. Daughter died of colorectal cancer.   REVIEW OF SYSTEMS: CONSTITUTIONAL: Positive for fever. Positive for chills. Positive for sweats. Positive for fatigue. No weight gain. No weight loss.  EYES: No blurry vision.  EARS, NOSE, MOUTH AND THROAT: No hearing loss. No sore  throat. No difficulty swallowing.  CARDIOVASCULAR: No chest pain. No palpitations.  RESPIRATORY: Positive for shortness of breath. Positive for cough. Positive for wheeze.  GASTROINTESTINAL: No nausea. No vomiting. No abdominal pain. No diarrhea. No constipation. No bright red blood per rectum. No melena.  GENITOURINARY: No burning on urination, no hematuria.  MUSCULOSKELETAL: Positive for joint pains, low back pain.  INTEGUMENT: No rashes or eruptions.  NEUROLOGIC: No fainting or blackouts.  PSYCHIATRIC: Positive for anxiety and depression.  ENDOCRINE: No thyroid problems.  HEMATOLOGIC/LYMPHATIC: No anemia, no easy bruising or bleeding.   PHYSICAL EXAMINATION: VITAL SIGNS: Temperature 103.1, pulse 88, respirations 24, blood pressure 151/67, pulse ox 84% on oxygen.  GENERAL: Positive respiratory distress, using accessory muscles to breathe.  EYES: Conjunctivae and lids normal. Pupils equal, round and reactive to light. Extraocular muscles intact. No nystagmus.  EARS, NOSE, MOUTH AND THROAT: Bilateral tympanic membranes erythematous. Nasal mucosa no erythema. Throat no erythema, no exudate seen. Lips and gums no lesions.  NECK: No JVD. No bruits. No lymphadenopathy. No thyromegaly. No thyroid nodules palpated.  RESPIRATORY: Positive use of accessory muscles to breathe. Poor air entry. Bilateral wheeze and rhonchi throughout entire lung field.  CARDIOVASCULAR SYSTEM: S1 and S2 normal. No gallops, rubs or murmurs heard. Carotid upstroke 2+ bilaterally. No bruits.  EXTREMITIES: Dorsalis pedis pulses 2+ bilaterally. Trace edema of the lower extremity.  ABDOMEN: Soft, nontender. No organomegaly/splenomegaly. Normoactive bowel sounds. No masses felt.  LYMPHATIC: No lymph nodes in the neck.  MUSCULOSKELETAL: Trace edema. No clubbing. No  cyanosis.  SKIN: No rashes or ulcers seen.  NEUROLOGIC: Cranial nerves II through XII grossly intact. Deep tendon reflexes half plus  bilateral lower extremities.   PSYCHIATRIC: The patient is oriented to person, place and time.   LABORATORY AND RADIOLOGICAL DATA: EKG shows normal sinus rhythm, 71 beats per minute. ABG shows a pH of 7.24, pCO2 of 83, pO2 of 69, that is on 40% oxygen, O2 saturation 90.1. Urinalysis 1+ blood. Influenza A positive. White blood cell count 13.0, H and H  13.3 and 41.5, platelet count of 124. Glucose 202, BUN 21, creatinine 0.87, sodium 135, potassium 4.2, chloride 100, CO2 31, calcium 8.3. Liver function tests normal. Looking back at previous laboratory data, platelet count was normal.   ASSESSMENT AND PLAN: 1.  Acute respiratory failure with CO2 retention, BiPAP in order to oxygenate and move air started 10/5, 40% oxygen. We will try to keep out of the ICU even though the patient is critically ill at this point. High risk for intubation.  2.  Systemic inflammatory response syndrome with fever and leukocytosis. ER physician felt the chest x-ray looked like a right lower lobe pneumonia, will wait for radiology report on this. ER physician ordered Levaquin, I will continue that 750 mg IV daily. Follow up blood cultures and sputum cultures.  3.  Influenza positive. Will start Tamiflu 75 mg b.i.d., it will be a 5-day course, first dose tonight.  4.  Chronic obstructive pulmonary disease exacerbation. We will start Solu-Medrol 125 mg IV x 1 and 40 mg IV q.8 hours. Levaquin, DuoNeb nebulizer solution. Continue Advair and Singulair.  5.  Diabetes. The patient is on metformin, will also add sliding scale.  6.  Hypertension. The patient is on numerous blood pressure medications, continue at this time.  7.  Sleep apnea. She wears CPAP at night. Weight loss recommended, BMI is up at 39.1.  8.  Thrombocytopenia. This is new, probably secondary to viral illness. 9.  Tobacco abuse. Smoking cessation counseling done, 3 minutes by me. Nicotine patch applied.   TIME SPENT ON ADMISSION: 55 minutes, the patient critically ill.     ____________________________ Tana Conch. Leslye Peer, MD rjw:cs D: 01/02/2013 18:54:52 ET T: 01/02/2013 19:27:24 ET JOB#: [id number]  cc: Tana Conch. Leslye Peer, MD, <Dictator> Lavera Guise, MD Marisue Brooklyn MD ELECTRONICALLY SIGNED 01/12/2013 20:00

## 2015-04-21 DIAGNOSIS — J449 Chronic obstructive pulmonary disease, unspecified: Secondary | ICD-10-CM | POA: Diagnosis not present

## 2015-04-24 DIAGNOSIS — R0609 Other forms of dyspnea: Secondary | ICD-10-CM | POA: Diagnosis not present

## 2015-04-24 DIAGNOSIS — G4733 Obstructive sleep apnea (adult) (pediatric): Secondary | ICD-10-CM | POA: Diagnosis not present

## 2015-04-24 DIAGNOSIS — J439 Emphysema, unspecified: Secondary | ICD-10-CM | POA: Diagnosis not present

## 2015-04-24 DIAGNOSIS — R0902 Hypoxemia: Secondary | ICD-10-CM | POA: Diagnosis not present

## 2015-04-29 DIAGNOSIS — F1120 Opioid dependence, uncomplicated: Secondary | ICD-10-CM | POA: Diagnosis not present

## 2015-04-29 DIAGNOSIS — M5418 Radiculopathy, sacral and sacrococcygeal region: Secondary | ICD-10-CM | POA: Diagnosis not present

## 2015-04-29 DIAGNOSIS — F17290 Nicotine dependence, other tobacco product, uncomplicated: Secondary | ICD-10-CM | POA: Diagnosis not present

## 2015-04-29 DIAGNOSIS — M5430 Sciatica, unspecified side: Secondary | ICD-10-CM | POA: Diagnosis not present

## 2015-04-29 DIAGNOSIS — M545 Low back pain: Secondary | ICD-10-CM | POA: Diagnosis not present

## 2015-04-29 DIAGNOSIS — G8929 Other chronic pain: Secondary | ICD-10-CM | POA: Diagnosis not present

## 2015-04-29 DIAGNOSIS — Z79891 Long term (current) use of opiate analgesic: Secondary | ICD-10-CM | POA: Diagnosis not present

## 2015-04-29 DIAGNOSIS — M150 Primary generalized (osteo)arthritis: Secondary | ICD-10-CM | POA: Diagnosis not present

## 2015-05-06 DIAGNOSIS — Z79899 Other long term (current) drug therapy: Secondary | ICD-10-CM | POA: Diagnosis not present

## 2015-05-07 DIAGNOSIS — R0602 Shortness of breath: Secondary | ICD-10-CM | POA: Diagnosis not present

## 2015-05-16 DIAGNOSIS — J449 Chronic obstructive pulmonary disease, unspecified: Secondary | ICD-10-CM | POA: Diagnosis not present

## 2015-05-16 DIAGNOSIS — I272 Other secondary pulmonary hypertension: Secondary | ICD-10-CM | POA: Diagnosis not present

## 2015-05-16 DIAGNOSIS — E785 Hyperlipidemia, unspecified: Secondary | ICD-10-CM | POA: Diagnosis not present

## 2015-05-16 DIAGNOSIS — R0602 Shortness of breath: Secondary | ICD-10-CM | POA: Diagnosis not present

## 2015-05-21 DIAGNOSIS — J449 Chronic obstructive pulmonary disease, unspecified: Secondary | ICD-10-CM | POA: Diagnosis not present

## 2015-06-10 ENCOUNTER — Ambulatory Visit: Payer: Self-pay | Admitting: Family Medicine

## 2015-06-11 ENCOUNTER — Encounter: Payer: Self-pay | Admitting: Family Medicine

## 2015-06-11 ENCOUNTER — Ambulatory Visit (INDEPENDENT_AMBULATORY_CARE_PROVIDER_SITE_OTHER): Payer: Commercial Managed Care - HMO | Admitting: Family Medicine

## 2015-06-11 VITALS — BP 104/66 | HR 73 | Temp 98.7°F | Resp 16 | Ht 69.0 in | Wt 227.8 lb

## 2015-06-11 DIAGNOSIS — M1990 Unspecified osteoarthritis, unspecified site: Secondary | ICD-10-CM | POA: Insufficient documentation

## 2015-06-11 DIAGNOSIS — I272 Pulmonary hypertension, unspecified: Secondary | ICD-10-CM | POA: Insufficient documentation

## 2015-06-11 DIAGNOSIS — R5383 Other fatigue: Secondary | ICD-10-CM

## 2015-06-11 DIAGNOSIS — E1140 Type 2 diabetes mellitus with diabetic neuropathy, unspecified: Secondary | ICD-10-CM | POA: Insufficient documentation

## 2015-06-11 DIAGNOSIS — E119 Type 2 diabetes mellitus without complications: Secondary | ICD-10-CM | POA: Diagnosis not present

## 2015-06-11 DIAGNOSIS — E7800 Pure hypercholesterolemia, unspecified: Secondary | ICD-10-CM | POA: Insufficient documentation

## 2015-06-11 LAB — POCT GLYCOSYLATED HEMOGLOBIN (HGB A1C): Hemoglobin A1C: 6.2

## 2015-06-11 MED ORDER — GABAPENTIN 400 MG PO CAPS: 400.0000 mg | ORAL_CAPSULE | Freq: Three times a day (TID) | ORAL | Status: DC

## 2015-06-11 NOTE — Progress Notes (Signed)
Name: Megan Bolton   MRN: [id number]    DOB: 04-03-1941   Date:06/11/2015       Progress Note  Subjective  Chief Complaint  Chief Complaint  Patient presents with  . Fatigue    1 week- Depressive state from feeling so malaise.  . Back Pain    Buttock area Pain   . Diabetes    Checks sugars every other day ranges 110-190    HPI  Here for extreme fatigue and  Muscle aches and weakness.  Pain mainly in bilat. Legs and low back. No injury.  Npo changes a home except on O2 constantly.   Past Medical History  Diagnosis Date  . Diabetes mellitus without complication   . Hypertension   . COPD (chronic obstructive pulmonary disease)   . Hyperlipidemia     Past Surgical History  Procedure Laterality Date  . Abdominal hysterectomy    . Spine surgery      Family History  Problem Relation Age of Onset  . Hypertension Paternal Grandfather     History   Social History  . Marital Status: Widowed    Spouse Name: N/A  . Number of Children: N/A  . Years of Education: N/A   Occupational History  . Not on file.   Social History Main Topics  . Smoking status: Former Smoker -- 0.50 packs/day    Quit date: 04/28/2015  . Smokeless tobacco: Never Used  . Alcohol Use: No  . Drug Use: No  . Sexual Activity: Not on file   Other Topics Concern  . Not on file   Social History Narrative  . No narrative on file     Current outpatient prescriptions:  .  ADVAIR DISKUS 250-50 MCG/DOSE AEPB, , Disp: , Rfl:  .  albuterol (PROAIR HFA) 108 (90 BASE) MCG/ACT inhaler, Inhale into the lungs., Disp: , Rfl:  .  amLODipine (NORVASC) 2.5 MG tablet, , Disp: , Rfl:  .  carvedilol (COREG) 25 MG tablet, , Disp: , Rfl:  .  clonazePAM (KLONOPIN) 0.5 MG tablet, , Disp: , Rfl:  .  furosemide (LASIX) 20 MG tablet, , Disp: , Rfl:  .  indapamide (LOZOL) 1.25 MG tablet, , Disp: , Rfl:  .  lisinopril (PRINIVIL,ZESTRIL) 40 MG tablet, , Disp: , Rfl:  .  metFORMIN (GLUCOPHAGE) 500 MG tablet, , Disp:  , Rfl:  .  montelukast (SINGULAIR) 10 MG tablet, , Disp: , Rfl:  .  oxyCODONE (OXY IR/ROXICODONE) 5 MG immediate release tablet, , Disp: , Rfl:  .  potassium chloride (K-DUR) 10 MEQ tablet, , Disp: , Rfl:  .  SPIRIVA HANDIHALER 18 MCG inhalation capsule, , Disp: , Rfl:  .  valACYclovir (VALTREX) 1000 MG tablet, , Disp: , Rfl:  .  gabapentin (NEURONTIN) 400 MG capsule, Take 1 capsule (400 mg total) by mouth 3 (three) times daily., Disp: 90 capsule, Rfl: 12  Not on File   Review of Systems  Constitutional: Positive for chills and malaise/fatigue. Negative for fever and weight loss.  HENT: Negative.   Eyes: Negative.  Negative for blurred vision and double vision.  Respiratory: Positive for shortness of breath and wheezing (occ.). Negative for cough.   Cardiovascular: Positive for leg swelling. Negative for chest pain, palpitations and orthopnea.  Gastrointestinal: Negative for heartburn, nausea, vomiting, abdominal pain, diarrhea and blood in stool.  Genitourinary: Negative for dysuria, urgency, frequency and hematuria.  Musculoskeletal: Positive for myalgias and joint pain. Back pain: tired.  Skin: Negative.  Negative for  rash.  Neurological: Positive for weakness. Negative for headaches.      Objective  Filed Vitals:   06/11/15 1446  BP: 104/66  Pulse: 73  Temp: 98.7 F (37.1 C)  Resp: 16  Height: 5\' 9"  (1.753 m)  Weight: 227 lb 12.8 oz (103.329 kg)  SpO2: 93%    Physical Exam  Constitutional: She is oriented to person, place, and time. She appears distressed (weakness).  HENT:  Head: Normocephalic and atraumatic.  Eyes: EOM are normal. Pupils are equal, round, and reactive to light. No scleral icterus.  Neck: Normal range of motion. Neck supple. No thyromegaly present.  Cardiovascular: Normal rate, regular rhythm, normal heart sounds and intact distal pulses.  Exam reveals no gallop and no friction rub.   No murmur heard. Pulmonary/Chest: She is in respiratory  distress (mild, on O2). Decreased breath sounds: diffuse.  Abdominal: Soft. Bowel sounds are normal. She exhibits no distension and no mass. There is no tenderness.  Musculoskeletal: She exhibits no edema.  Lymphadenopathy:    She has no cervical adenopathy.  Neurological: She is alert and oriented to person, place, and time.  Psychiatric:  Affect mildly depressed.  Vitals reviewed.      Recent Results (from the past 2160 hour(s))  POCT HgB A1C     Status: Abnormal   Collection Time: 06/11/15  3:08 PM  Result Value Ref Range   Hemoglobin A1C 6.2      Assessment & Plan  Problem List Items Addressed This Visit    None    Visit Diagnoses    Other fatigue    -  Primary    Relevant Orders    CBC    Comprehensive Metabolic Panel (CMET)    TSH    Vitamin D (25 hydroxy)    B12    Type 2 diabetes mellitus without complication        Relevant Medications    lisinopril (PRINIVIL,ZESTRIL) 40 MG tablet    metFORMIN (GLUCOPHAGE) 500 MG tablet    Other Relevant Orders    POCT HgB A1C (Completed)       Meds ordered this encounter  Medications  . amLODipine (NORVASC) 2.5 MG tablet    Sig:   . carvedilol (COREG) 25 MG tablet    Sig:   . clonazePAM (KLONOPIN) 0.5 MG tablet    Sig:   . ADVAIR DISKUS 250-50 MCG/DOSE AEPB    Sig:   . furosemide (LASIX) 20 MG tablet    Sig:   . DISCONTD: gabapentin (NEURONTIN) 600 MG tablet    Sig: Take 400 mg by mouth 3 (three) times daily. Take 1 tablet oraally three times a day.  Marland Kitchen DISCONTD: gabapentin (NEURONTIN) 300 MG capsule    Sig:   . indapamide (LOZOL) 1.25 MG tablet    Sig:   . DISCONTD: levofloxacin (LEVAQUIN) 750 MG tablet    Sig:   . DISCONTD: levofloxacin (LEVAQUIN) 500 MG tablet    Sig:   . lisinopril (PRINIVIL,ZESTRIL) 40 MG tablet    Sig:   . metFORMIN (GLUCOPHAGE) 500 MG tablet    Sig:   . montelukast (SINGULAIR) 10 MG tablet    Sig:   . oxyCODONE (OXY IR/ROXICODONE) 5 MG immediate release tablet    Sig:   .  potassium chloride (K-DUR) 10 MEQ tablet    Sig:   . SPIRIVA HANDIHALER 18 MCG inhalation capsule    Sig:   . valACYclovir (VALTREX) 1000 MG tablet    Sig:   .  albuterol (PROAIR HFA) 108 (90 BASE) MCG/ACT inhaler    Sig: Inhale into the lungs.  . gabapentin (NEURONTIN) 400 MG capsule    Sig: Take 1 capsule (400 mg total) by mouth 3 (three) times daily.    Dispense:  90 capsule    Refill:  12    1. Type 2 diabetes mellitus without complication  - POCT HgB A1C  2. Fatigue-  CBC - Comprehensive Metabolic Panel (CMET) - TSH - Vitamin D (25 hydroxy) - B12 Decrease dose of Gabapentin from 600 mg to 400 mg tid.

## 2015-06-12 DIAGNOSIS — R5383 Other fatigue: Secondary | ICD-10-CM | POA: Diagnosis not present

## 2015-06-12 LAB — CBC
Hematocrit: 38 % (ref 34.0–46.6)
Hemoglobin: 12.3 g/dL (ref 11.1–15.9)
MCH: 28.9 pg (ref 26.6–33.0)
MCHC: 32.4 g/dL (ref 31.5–35.7)
MCV: 89 fL (ref 79–97)
PLATELETS: 216 10*3/uL (ref 150–379)
RBC: 4.25 x10E6/uL (ref 3.77–5.28)
RDW: 13 % (ref 12.3–15.4)
WBC: 9.9 10*3/uL (ref 3.4–10.8)

## 2015-06-13 ENCOUNTER — Telehealth: Payer: Self-pay | Admitting: Family Medicine

## 2015-06-13 LAB — COMPREHENSIVE METABOLIC PANEL
ALT: 9 IU/L (ref 0–32)
AST: 8 IU/L (ref 0–40)
Albumin/Globulin Ratio: 1.5 (ref 1.1–2.5)
Albumin: 3.9 g/dL (ref 3.5–4.8)
Alkaline Phosphatase: 58 IU/L (ref 39–117)
BUN/Creatinine Ratio: 25 (ref 11–26)
BUN: 32 mg/dL — AB (ref 8–27)
Bilirubin Total: 0.3 mg/dL (ref 0.0–1.2)
CALCIUM: 9 mg/dL (ref 8.7–10.3)
CO2: 30 mmol/L — AB (ref 18–29)
CREATININE: 1.26 mg/dL — AB (ref 0.57–1.00)
Chloride: 101 mmol/L (ref 97–108)
GFR calc Af Amer: 49 mL/min/{1.73_m2} — ABNORMAL LOW (ref >59)
GFR calc non Af Amer: 42 mL/min/{1.73_m2} — ABNORMAL LOW (ref >59)
Globulin, Total: 2.6 g/dL (ref 1.5–4.5)
Glucose: 144 mg/dL — ABNORMAL HIGH (ref 65–99)
Potassium: 4.6 mmol/L (ref 3.5–5.2)
Sodium: 143 mmol/L (ref 134–144)
Total Protein: 6.5 g/dL (ref 6.0–8.5)

## 2015-06-13 LAB — TSH: TSH: 1.56 u[IU]/mL (ref 0.450–4.500)

## 2015-06-13 LAB — VITAMIN B12: Vitamin B-12: 453 pg/mL (ref 211–946)

## 2015-06-13 LAB — VITAMIN D 25 HYDROXY (VIT D DEFICIENCY, FRACTURES): VIT D 25 HYDROXY: 17.2 ng/mL — AB (ref 30.0–100.0)

## 2015-06-13 NOTE — Telephone Encounter (Signed)
Advised and will start Vit D.JH

## 2015-06-13 NOTE — Telephone Encounter (Signed)
I am assuming the Rx you sent yesterday did not go through at all. What should I fill? [facility]

## 2015-06-13 NOTE — Telephone Encounter (Signed)
Pt states she called about her prescriptions and was told that it was not received. Pt states Dr. Luan Pulling needs to send Huey Romans a prescription with dx and clinical information fax [phone] and phone number [phone]

## 2015-06-14 ENCOUNTER — Telehealth: Payer: Self-pay | Admitting: Family Medicine

## 2015-06-14 DIAGNOSIS — M5416 Radiculopathy, lumbar region: Secondary | ICD-10-CM | POA: Diagnosis not present

## 2015-06-14 DIAGNOSIS — M5136 Other intervertebral disc degeneration, lumbar region: Secondary | ICD-10-CM | POA: Diagnosis not present

## 2015-06-14 NOTE — Telephone Encounter (Signed)
Spoke to [place] from [place] who states she is unable to schedule the appointment for the patient until she receives the [facility] referral.   Callback number: [phone]

## 2015-06-14 NOTE — Telephone Encounter (Signed)
What does she need filled.  I guess call her pharmacy and figure this out. Vit. D is OTC of course.  And what is that about calling [facility] about something?-jh

## 2015-06-14 NOTE — Telephone Encounter (Signed)
Called and they do not need anything from [place].[facility]

## 2015-06-18 NOTE — Telephone Encounter (Signed)
Done

## 2015-06-19 ENCOUNTER — Other Ambulatory Visit: Payer: Self-pay | Admitting: Family Medicine

## 2015-06-20 ENCOUNTER — Telehealth: Payer: Self-pay | Admitting: Family Medicine

## 2015-06-20 NOTE — Telephone Encounter (Signed)
Not sure how to do this. Please review/ [facility]

## 2015-06-20 NOTE — Telephone Encounter (Signed)
Pt states [facility] needs prescription for POC instead of just portable oxygen.  Phone Number [phone] Fax number [phone]

## 2015-06-21 DIAGNOSIS — J449 Chronic obstructive pulmonary disease, unspecified: Secondary | ICD-10-CM | POA: Diagnosis not present

## 2015-06-21 NOTE — Telephone Encounter (Signed)
I am not sure either.  Need to contact [facility] and ask what they need for POC (?portable oxygen concentrator).-jh

## 2015-06-24 NOTE — Telephone Encounter (Signed)
Pt called wanting an update on the prescription timeline. Pt also provide another number for [facility] if needed to speedup the process. [phone]

## 2015-06-25 ENCOUNTER — Other Ambulatory Visit: Payer: Self-pay

## 2015-06-25 DIAGNOSIS — J438 Other emphysema: Secondary | ICD-10-CM

## 2015-06-25 NOTE — Telephone Encounter (Signed)
Faxed Order [facility]

## 2015-07-04 ENCOUNTER — Other Ambulatory Visit: Payer: Self-pay | Admitting: Family Medicine

## 2015-07-08 ENCOUNTER — Ambulatory Visit: Payer: Commercial Managed Care - HMO | Admitting: Family Medicine

## 2015-07-08 DIAGNOSIS — F1120 Opioid dependence, uncomplicated: Secondary | ICD-10-CM | POA: Diagnosis not present

## 2015-07-08 DIAGNOSIS — Z79899 Other long term (current) drug therapy: Secondary | ICD-10-CM | POA: Diagnosis not present

## 2015-07-08 DIAGNOSIS — M5430 Sciatica, unspecified side: Secondary | ICD-10-CM | POA: Diagnosis not present

## 2015-07-08 DIAGNOSIS — G8929 Other chronic pain: Secondary | ICD-10-CM | POA: Diagnosis not present

## 2015-07-08 DIAGNOSIS — M150 Primary generalized (osteo)arthritis: Secondary | ICD-10-CM | POA: Diagnosis not present

## 2015-07-08 DIAGNOSIS — M545 Low back pain: Secondary | ICD-10-CM | POA: Diagnosis not present

## 2015-07-08 DIAGNOSIS — M5418 Radiculopathy, sacral and sacrococcygeal region: Secondary | ICD-10-CM | POA: Diagnosis not present

## 2015-07-10 ENCOUNTER — Ambulatory Visit: Payer: Self-pay | Admitting: Family Medicine

## 2015-07-10 DIAGNOSIS — J449 Chronic obstructive pulmonary disease, unspecified: Secondary | ICD-10-CM | POA: Diagnosis not present

## 2015-07-10 DIAGNOSIS — J438 Other emphysema: Secondary | ICD-10-CM | POA: Diagnosis not present

## 2015-07-12 ENCOUNTER — Ambulatory Visit (INDEPENDENT_AMBULATORY_CARE_PROVIDER_SITE_OTHER): Payer: Commercial Managed Care - HMO | Admitting: Family Medicine

## 2015-07-12 ENCOUNTER — Ambulatory Visit
Admission: RE | Admit: 2015-07-12 | Discharge: 2015-07-12 | Disposition: A | Discharge status: Home / Self Care | Payer: Commercial Managed Care - HMO | Source: Ambulatory Visit | Attending: Family Medicine | Admitting: Family Medicine

## 2015-07-12 ENCOUNTER — Encounter: Payer: Self-pay | Admitting: Family Medicine

## 2015-07-12 ENCOUNTER — Other Ambulatory Visit: Payer: Self-pay | Admitting: Family Medicine

## 2015-07-12 VITALS — BP 150/70 | HR 65 | Temp 97.8°F | Wt 235.0 lb

## 2015-07-12 DIAGNOSIS — R5382 Chronic fatigue, unspecified: Secondary | ICD-10-CM | POA: Diagnosis not present

## 2015-07-12 DIAGNOSIS — S2232XA Fracture of one rib, left side, initial encounter for closed fracture: Secondary | ICD-10-CM | POA: Diagnosis not present

## 2015-07-12 DIAGNOSIS — X58XXXA Exposure to other specified factors, initial encounter: Secondary | ICD-10-CM | POA: Insufficient documentation

## 2015-07-12 DIAGNOSIS — J449 Chronic obstructive pulmonary disease, unspecified: Secondary | ICD-10-CM | POA: Diagnosis not present

## 2015-07-12 DIAGNOSIS — N189 Chronic kidney disease, unspecified: Secondary | ICD-10-CM | POA: Insufficient documentation

## 2015-07-12 DIAGNOSIS — N184 Chronic kidney disease, stage 4 (severe): Secondary | ICD-10-CM | POA: Insufficient documentation

## 2015-07-12 DIAGNOSIS — N183 Chronic kidney disease, stage 3 (moderate): Secondary | ICD-10-CM | POA: Diagnosis not present

## 2015-07-12 DIAGNOSIS — S298XXA Other specified injuries of thorax, initial encounter: Secondary | ICD-10-CM

## 2015-07-12 DIAGNOSIS — S299XXA Unspecified injury of thorax, initial encounter: Secondary | ICD-10-CM

## 2015-07-12 DIAGNOSIS — I10 Essential (primary) hypertension: Secondary | ICD-10-CM

## 2015-07-12 NOTE — Patient Instructions (Addendum)
Continue all current meds.  Recheck BMP on return

## 2015-07-12 NOTE — Progress Notes (Signed)
Name: Megan Bolton   MRN: [id number]    DOB: 03-24-1941   Date:07/12/2015       Progress Note  Subjective  Chief Complaint  Chief Complaint  Patient presents with  . Follow-up    1 month  . Fatigue  . Rib Pain    more rib pain, patient states that she was cleaning out chest freezer and felt "a bone rolled over", now she states she is a great deal of pain worse with breathing    HPI  C/o L. Rib care pain sec. too injury from bending over a freezes.  Felt pain and L. Rib injury. Energy is improved.  Has started Vit. D as recommended. Her pain med MD has increased her oxycodone to 10 mg every 8 hourfs.  Past Medical History  Diagnosis Date  . Diabetes mellitus without complication   . Hypertension   . COPD (chronic obstructive pulmonary disease)   . Hyperlipidemia     History  Substance Use Topics  . Smoking status: Former Smoker -- 0.50 packs/day    Quit date: 04/28/2015  . Smokeless tobacco: Never Used  . Alcohol Use: No      No Known Allergies  Review of Systems  Constitutional: Positive for malaise/fatigue (improving). Negative for fever and chills.  Respiratory: Negative for cough, shortness of breath and wheezing.   Cardiovascular: Positive for chest pain (L.chest wall pain ). Negative for palpitations, orthopnea and leg swelling.  Gastrointestinal: Negative for abdominal pain and blood in stool.  Genitourinary: Negative for dysuria, urgency and frequency.  Musculoskeletal: Negative for myalgias and joint pain.  Skin: Negative for rash.     Objective  Filed Vitals:   07/12/15 1501 07/12/15 1537  BP: 173/92 150/70  Pulse: 65   Temp: 97.8 F (36.6 C)   TempSrc: Oral   Weight: 235 lb (106.595 kg)      Physical Exam  Constitutional: She is well-developed, well-nourished, and in no distress.  HENT:  Head: Normocephalic and atraumatic.  Neck: Normal range of motion. Neck supple. No thyromegaly present.  Cardiovascular: Normal rate, regular rhythm,  normal heart sounds and intact distal pulses.  Exam reveals no gallop and no friction rub.   No murmur heard. Pulmonary/Chest: Effort normal and breath sounds normal. She has no wheezes. She has no rales. Tenderness: L. anterior rib pain beneath L. breast.  Musculoskeletal: She exhibits edema (trace bilateral pedal edema.).  Lymphadenopathy:    She has no cervical adenopathy.  Vitals reviewed.     Recent Results (from the past 2160 hour(s))  POCT HgB A1C     Status: Abnormal   Collection Time: 06/11/15  3:08 PM  Result Value Ref Range   Hemoglobin A1C 6.2   CBC     Status: None   Collection Time: 06/12/15  9:40 AM  Result Value Ref Range   WBC 9.9 3.4 - 10.8 x10E3/uL   RBC 4.25 3.77 - 5.28 x10E6/uL   Hemoglobin 12.3 11.1 - 15.9 g/dL   Hematocrit 38.0 34.0 - 46.6 %   MCV 89 79 - 97 fL   MCH 28.9 26.6 - 33.0 pg   MCHC 32.4 31.5 - 35.7 g/dL   RDW 13.0 12.3 - 15.4 %   Platelets 216 150 - 379 x10E3/uL  Comprehensive Metabolic Panel (CMET)     Status: Abnormal   Collection Time: 06/12/15  9:40 AM  Result Value Ref Range   Glucose 144 (H) 65 - 99 mg/dL   BUN 32 (H) 8 -  27 mg/dL   Creatinine, Ser 1.26 (H) 0.57 - 1.00 mg/dL   GFR calc non Af Amer 42 (L) >59 mL/min/1.73   GFR calc Af Amer 49 (L) >59 mL/min/1.73   BUN/Creatinine Ratio 25 11 - 26   Sodium 143 134 - 144 mmol/L   Potassium 4.6 3.5 - 5.2 mmol/L   Chloride 101 97 - 108 mmol/L   CO2 30 (H) 18 - 29 mmol/L   Calcium 9.0 8.7 - 10.3 mg/dL   Total Protein 6.5 6.0 - 8.5 g/dL   Albumin 3.9 3.5 - 4.8 g/dL   Globulin, Total 2.6 1.5 - 4.5 g/dL   Albumin/Globulin Ratio 1.5 1.1 - 2.5   Bilirubin Total 0.3 0.0 - 1.2 mg/dL   Alkaline Phosphatase 58 39 - 117 IU/L   AST 8 0 - 40 IU/L   ALT 9 0 - 32 IU/L  TSH     Status: None   Collection Time: 06/12/15  9:40 AM  Result Value Ref Range   TSH 1.560 0.450 - 4.500 uIU/mL  Vitamin D (25 hydroxy)     Status: Abnormal   Collection Time: 06/12/15  9:40 AM  Result Value Ref Range    Vit D, 25-Hydroxy 17.2 (L) 30.0 - 100.0 ng/mL    Comment: Vitamin D deficiency has been defined by the Institute of Medicine and an Endocrine Society practice guideline as a level of serum 25-OH vitamin D less than 20 ng/mL (1,2). The Endocrine Society went on to further define vitamin D insufficiency as a level between 21 and 29 ng/mL (2). 1. IOM (Institute of Medicine). 2010. Dietary reference    intakes for calcium and D. [place]: The    [employer]. 2. Holick MF, Binkley [place], Bischoff-Ferrari HA, et al.    Evaluation, treatment, and prevention of vitamin D    deficiency: an Endocrine Society clinical practice    guideline. JCEM. 2011 Jul; 96(7):1911-30.   B12     Status: None   Collection Time: 06/12/15  9:41 AM  Result Value Ref Range   Vitamin B-12 453 211 - 946 pg/mL     Assessment & Plan   1. Chest wall injury, initial encounter  - DG Ribs Unilateral Left  2. Chronic fatigue   3. Essential hypertension   4. [name] (chronic kidney disease), stage 3 (moderate)  - Basic Metabolic Panel (BMET); Future

## 2015-07-15 DIAGNOSIS — I498 Other specified cardiac arrhythmias: Secondary | ICD-10-CM | POA: Diagnosis not present

## 2015-07-15 DIAGNOSIS — I771 Stricture of artery: Secondary | ICD-10-CM | POA: Diagnosis not present

## 2015-07-15 DIAGNOSIS — M4806 Spinal stenosis, lumbar region: Secondary | ICD-10-CM | POA: Diagnosis not present

## 2015-07-15 DIAGNOSIS — E119 Type 2 diabetes mellitus without complications: Secondary | ICD-10-CM | POA: Diagnosis not present

## 2015-07-15 DIAGNOSIS — J449 Chronic obstructive pulmonary disease, unspecified: Secondary | ICD-10-CM | POA: Diagnosis not present

## 2015-07-15 DIAGNOSIS — T402X5A Adverse effect of other opioids, initial encounter: Secondary | ICD-10-CM | POA: Diagnosis not present

## 2015-07-15 DIAGNOSIS — M6281 Muscle weakness (generalized): Secondary | ICD-10-CM | POA: Diagnosis not present

## 2015-07-15 DIAGNOSIS — I10 Essential (primary) hypertension: Secondary | ICD-10-CM | POA: Diagnosis not present

## 2015-07-15 DIAGNOSIS — R531 Weakness: Secondary | ICD-10-CM | POA: Diagnosis not present

## 2015-07-15 DIAGNOSIS — J90 Pleural effusion, not elsewhere classified: Secondary | ICD-10-CM | POA: Diagnosis not present

## 2015-07-15 DIAGNOSIS — T50905A Adverse effect of unspecified drugs, medicaments and biological substances, initial encounter: Secondary | ICD-10-CM | POA: Diagnosis not present

## 2015-07-17 ENCOUNTER — Telehealth: Payer: Self-pay | Admitting: Family Medicine

## 2015-07-17 ENCOUNTER — Telehealth: Payer: Self-pay

## 2015-07-17 ENCOUNTER — Ambulatory Visit: Payer: Self-pay | Admitting: Family Medicine

## 2015-07-17 DIAGNOSIS — M150 Primary generalized (osteo)arthritis: Secondary | ICD-10-CM | POA: Diagnosis not present

## 2015-07-17 DIAGNOSIS — M5430 Sciatica, unspecified side: Secondary | ICD-10-CM | POA: Diagnosis not present

## 2015-07-17 DIAGNOSIS — Z79899 Other long term (current) drug therapy: Secondary | ICD-10-CM | POA: Diagnosis not present

## 2015-07-17 DIAGNOSIS — M5418 Radiculopathy, sacral and sacrococcygeal region: Secondary | ICD-10-CM | POA: Diagnosis not present

## 2015-07-17 DIAGNOSIS — M545 Low back pain: Secondary | ICD-10-CM | POA: Diagnosis not present

## 2015-07-17 DIAGNOSIS — G8929 Other chronic pain: Secondary | ICD-10-CM | POA: Diagnosis not present

## 2015-07-17 DIAGNOSIS — F1120 Opioid dependence, uncomplicated: Secondary | ICD-10-CM | POA: Diagnosis not present

## 2015-07-17 NOTE — Telephone Encounter (Signed)
She has a cracked rib on the L.  It should heal on its own.  Just decrease activity for another month or so.-jh

## 2015-07-17 NOTE — Telephone Encounter (Signed)
Pt asked if she needed an appt with Dr. Luan Pulling to get another referral to Dr. Raul Del so she could see about getting a CPap machine.  Please call and leave message if she's not home.

## 2015-07-17 NOTE — Telephone Encounter (Signed)
Left detailed message regarding rest for L cracked Rib.[facility]

## 2015-07-17 NOTE — Telephone Encounter (Signed)
Patient called inquiring about her rib xray results. Please let me know what to tell her. [facility]

## 2015-07-17 NOTE — Telephone Encounter (Signed)
You can go ahead and refer her to Dr. Raul Del for evaluation for CPAP.-jh

## 2015-07-17 NOTE — Telephone Encounter (Signed)
I left detailed message about rib crack and then saw thi smessage. Please read and let me know if she needs appt for CPAP.[facility]

## 2015-07-19 ENCOUNTER — Other Ambulatory Visit: Payer: Self-pay | Admitting: Family Medicine

## 2015-07-19 ENCOUNTER — Telehealth: Payer: Self-pay | Admitting: Family Medicine

## 2015-07-19 NOTE — Telephone Encounter (Signed)
Will send to [facility] they wil call her for this.[facility]

## 2015-07-19 NOTE — Telephone Encounter (Signed)
Tammy from [place] requested a copy of the results of pt's base line sleep study or a split sleep study.  The fax number is [phone]

## 2015-07-19 NOTE — Telephone Encounter (Signed)
We have no record of sleep study prior. We are referring to Pulm. Dr. Raul Del to have him review need for sleep study.[facility]

## 2015-07-19 NOTE — Telephone Encounter (Signed)
Daughter states patient has appt with Raul Del next week.[facility]

## 2015-07-21 DIAGNOSIS — J449 Chronic obstructive pulmonary disease, unspecified: Secondary | ICD-10-CM | POA: Diagnosis not present

## 2015-07-22 NOTE — Telephone Encounter (Signed)
Added to Follow up on Thursday with Raul Del.[facility]

## 2015-07-24 ENCOUNTER — Telehealth: Payer: Self-pay | Admitting: Family Medicine

## 2015-07-24 NOTE — Telephone Encounter (Signed)
Called pt daughter to let her know that FMLA paperwork is complete. She can pick it up anytime.

## 2015-07-29 ENCOUNTER — Encounter: Payer: Self-pay | Admitting: Family Medicine

## 2015-07-29 ENCOUNTER — Ambulatory Visit (INDEPENDENT_AMBULATORY_CARE_PROVIDER_SITE_OTHER): Payer: Commercial Managed Care - HMO | Admitting: Family Medicine

## 2015-07-29 VITALS — BP 113/69 | HR 73 | Temp 98.0°F | Resp 16 | Ht 69.0 in | Wt 238.2 lb

## 2015-07-29 DIAGNOSIS — Z79899 Other long term (current) drug therapy: Secondary | ICD-10-CM | POA: Diagnosis not present

## 2015-07-29 DIAGNOSIS — N390 Urinary tract infection, site not specified: Secondary | ICD-10-CM

## 2015-07-29 DIAGNOSIS — F1120 Opioid dependence, uncomplicated: Secondary | ICD-10-CM | POA: Diagnosis not present

## 2015-07-29 LAB — POCT URINALYSIS DIPSTICK
Bilirubin, UA: NEGATIVE
Blood, UA: NEGATIVE
Glucose, UA: NEGATIVE
KETONES UA: NEGATIVE
LEUKOCYTES UA: NEGATIVE
Nitrite, UA: NEGATIVE
PROTEIN UA: NEGATIVE
Spec Grav, UA: 1.01
Urobilinogen, UA: NEGATIVE
pH, UA: 5

## 2015-07-29 NOTE — Progress Notes (Signed)
Subjective:    Patient ID: Megan Bolton, female    DOB: [birth year], 74 y.o.   MRN: [id number]  HPI: Megan Bolton is a 74 y.o. female presenting on 07/29/2015 for Urinary Tract Infection   HPI  Pt presents for UTI follow-up. Seen in [facility] ER on 7/18. She was placed on Macrobid x 5 days. She needs confirmation that UTI is cleared. She would like to talk about prophylaxis for UTI. Had 2-3 UTIs this year. None back to back. She is very concerned about getting another UTI.     Past Medical History  Diagnosis Date  . Diabetes mellitus without complication   . Hypertension   . COPD (chronic obstructive pulmonary disease)   . Hyperlipidemia     Current Outpatient Prescriptions on File Prior to Visit  Medication Sig  . ADVAIR DISKUS 250-50 MCG/DOSE AEPB INHALE 1 PUFF TWICE DAILY RINSE MOUTH AFTER EACH USE  . albuterol (PROAIR HFA) 108 (90 BASE) MCG/ACT inhaler Inhale into the lungs.  Marland Kitchen amLODipine (NORVASC) 2.5 MG tablet   . carvedilol (COREG) 25 MG tablet   . furosemide (LASIX) 20 MG tablet   . gabapentin (NEURONTIN) 400 MG capsule Take 1 capsule (400 mg total) by mouth 3 (three) times daily.  . indapamide (LOZOL) 1.25 MG tablet   . lisinopril (PRINIVIL,ZESTRIL) 40 MG tablet   . metFORMIN (GLUCOPHAGE) 500 MG tablet   . montelukast (SINGULAIR) 10 MG tablet   . Oxycodone HCl 10 MG TABS Take 1 tablet by mouth 3 (three) times daily as needed.  . potassium chloride (K-DUR) 10 MEQ tablet TAKE ONE TABLET TWICE DAILY  . SPIRIVA HANDIHALER 18 MCG inhalation capsule   . valACYclovir (VALTREX) 1000 MG tablet    No current facility-administered medications on file prior to visit.    Review of Systems  Constitutional: Negative for fever and chills.  Respiratory: Negative for chest tightness, shortness of breath and wheezing.   Cardiovascular: Negative for chest pain, palpitations and leg swelling.  Genitourinary: Negative for dysuria, urgency, frequency, hematuria, flank pain, decreased  urine volume and difficulty urinating.   Per HPI unless specifically indicated above     Objective:    BP 113/69 mmHg  Pulse 73  Temp(Src) 98 F (36.7 C) (Oral)  Resp 16  Ht 5\' 9"  (1.753 m)  Wt 238 lb 3.2 oz (108.047 kg)  BMI 35.16 kg/m2  Wt Readings from Last 3 Encounters:  07/29/15 238 lb 3.2 oz (108.047 kg)  07/12/15 235 lb (106.595 kg)  06/11/15 227 lb 12.8 oz (103.329 kg)    Physical Exam  Constitutional: She appears well-developed and well-nourished. No distress.  Cardiovascular: Normal rate and regular rhythm.  Exam reveals no gallop and no friction rub.   No murmur heard. Abdominal: There is no tenderness. There is no CVA tenderness.  Skin: She is not diaphoretic.   Results for orders placed or performed in visit on 07/29/15  POCT Urinalysis Dipstick  Result Value Ref Range   Color, UA yellow    Clarity, UA clear    Glucose, UA negative    Bilirubin, UA negative    Ketones, UA negative    Spec Grav, UA 1.010    Blood, UA negative    pH, UA 5.0    Protein, UA negative    Urobilinogen, UA negative    Nitrite, UA negative    Leukocytes, UA Negative Negative      Assessment & Plan:   Problem List Items Addressed This Visit  None    Visit Diagnoses    UTI (lower urinary tract infection)    -  Primary    Appears resolved today. Will confirm with culture. Discused UTI prevention with patient. Do not think she is candidate for prophylaxis at this time. 1 other UTI documented this year. No other risk factors. Consider urology referral if they become more frequent.     Relevant Orders    POCT Urinalysis Dipstick (Completed)    Urine Culture    CULTURE, URINE COMPREHENSIVE       Meds ordered this encounter  Medications  . clonazePAM (KLONOPIN) 0.5 MG tablet    Sig:       Follow up plan: Return if symptoms worsen or fail to improve.

## 2015-07-29 NOTE — Patient Instructions (Signed)

## 2015-07-30 ENCOUNTER — Other Ambulatory Visit: Payer: Self-pay

## 2015-07-30 DIAGNOSIS — R600 Localized edema: Secondary | ICD-10-CM

## 2015-07-30 MED ORDER — FUROSEMIDE 20 MG PO TABS: 20.0000 mg | ORAL_TABLET | Freq: Every day | ORAL | Status: DC

## 2015-07-31 ENCOUNTER — Telehealth: Payer: Self-pay | Admitting: Family Medicine

## 2015-07-31 DIAGNOSIS — N3000 Acute cystitis without hematuria: Secondary | ICD-10-CM

## 2015-07-31 LAB — URINE CULTURE

## 2015-07-31 MED ORDER — SULFAMETHOXAZOLE-TRIMETHOPRIM 800-160 MG PO TABS: 1.0000 | ORAL_TABLET | Freq: Two times a day (BID) | ORAL | Status: DC

## 2015-07-31 NOTE — Telephone Encounter (Signed)
Discussed urine culture results with patient. Pt urine culture grew out E.Coli.  I would like to re-treat her to ensure the infection resolves. Pt has no known drug allergies. Will treat with Bactrim DS twice daily for 7 days. Alarm symptoms reviewed. Repeat culture in 2 weeks.

## 2015-08-02 DIAGNOSIS — J439 Emphysema, unspecified: Secondary | ICD-10-CM | POA: Diagnosis not present

## 2015-08-02 DIAGNOSIS — G4733 Obstructive sleep apnea (adult) (pediatric): Secondary | ICD-10-CM | POA: Diagnosis not present

## 2015-08-02 DIAGNOSIS — I272 Other secondary pulmonary hypertension: Secondary | ICD-10-CM | POA: Diagnosis not present

## 2015-08-02 DIAGNOSIS — R0902 Hypoxemia: Secondary | ICD-10-CM | POA: Diagnosis not present

## 2015-08-05 DIAGNOSIS — G5601 Carpal tunnel syndrome, right upper limb: Secondary | ICD-10-CM | POA: Diagnosis not present

## 2015-08-05 DIAGNOSIS — M4806 Spinal stenosis, lumbar region: Secondary | ICD-10-CM | POA: Diagnosis not present

## 2015-08-05 DIAGNOSIS — M5416 Radiculopathy, lumbar region: Secondary | ICD-10-CM | POA: Diagnosis not present

## 2015-08-08 ENCOUNTER — Telehealth: Payer: Self-pay | Admitting: Family Medicine

## 2015-08-08 DIAGNOSIS — M5442 Lumbago with sciatica, left side: Secondary | ICD-10-CM

## 2015-08-08 NOTE — Telephone Encounter (Signed)
Received a call fr [place] states pt need a referral to  [facility]  With Dr Lowella Dandy, Lumbar steroids w/ injection  Crystal call  Back # at [facility] is  [phone].

## 2015-08-08 NOTE — Telephone Encounter (Signed)
Please review

## 2015-08-09 NOTE — Telephone Encounter (Signed)
Ok to refer her to the [facility].  We may need info from [facility] [place] if we are going to make the referral.  -jh

## 2015-08-10 DIAGNOSIS — J449 Chronic obstructive pulmonary disease, unspecified: Secondary | ICD-10-CM | POA: Diagnosis not present

## 2015-08-10 DIAGNOSIS — J438 Other emphysema: Secondary | ICD-10-CM | POA: Diagnosis not present

## 2015-08-12 NOTE — Telephone Encounter (Signed)
In [employer]

## 2015-08-13 DIAGNOSIS — G4733 Obstructive sleep apnea (adult) (pediatric): Secondary | ICD-10-CM | POA: Diagnosis not present

## 2015-08-13 DIAGNOSIS — I10 Essential (primary) hypertension: Secondary | ICD-10-CM | POA: Diagnosis not present

## 2015-08-16 ENCOUNTER — Other Ambulatory Visit: Payer: Self-pay | Admitting: Family Medicine

## 2015-08-16 ENCOUNTER — Encounter: Payer: Commercial Managed Care - HMO | Admitting: Family Medicine

## 2015-08-16 DIAGNOSIS — N3000 Acute cystitis without hematuria: Secondary | ICD-10-CM | POA: Diagnosis not present

## 2015-08-16 NOTE — Progress Notes (Signed)
Pt here for a lab only visit for urine culture.  This encounter was created in error - please disregard.

## 2015-08-19 LAB — CULTURE, URINE COMPREHENSIVE

## 2015-08-21 DIAGNOSIS — J449 Chronic obstructive pulmonary disease, unspecified: Secondary | ICD-10-CM | POA: Diagnosis not present

## 2015-08-21 NOTE — Progress Notes (Signed)
Sent Letter as we have left 2 messages.[facility]

## 2015-08-27 ENCOUNTER — Telehealth: Payer: Self-pay | Admitting: *Deleted

## 2015-08-27 ENCOUNTER — Other Ambulatory Visit: Payer: Self-pay | Admitting: Family Medicine

## 2015-08-27 NOTE — Telephone Encounter (Signed)
Pt aware of urine culture results

## 2015-08-29 ENCOUNTER — Telehealth: Payer: Self-pay | Admitting: *Deleted

## 2015-08-29 NOTE — Telephone Encounter (Signed)
Referral was made to pain clinic and [employer] approved. Pain clinic called patient and pt declined appt.

## 2015-08-30 NOTE — Telephone Encounter (Signed)
Noted-jh 

## 2015-09-09 DIAGNOSIS — M150 Primary generalized (osteo)arthritis: Secondary | ICD-10-CM | POA: Diagnosis not present

## 2015-09-09 DIAGNOSIS — Z79899 Other long term (current) drug therapy: Secondary | ICD-10-CM | POA: Diagnosis not present

## 2015-09-09 DIAGNOSIS — F1120 Opioid dependence, uncomplicated: Secondary | ICD-10-CM | POA: Diagnosis not present

## 2015-09-09 DIAGNOSIS — M5430 Sciatica, unspecified side: Secondary | ICD-10-CM | POA: Diagnosis not present

## 2015-09-09 DIAGNOSIS — M545 Low back pain: Secondary | ICD-10-CM | POA: Diagnosis not present

## 2015-09-09 DIAGNOSIS — M5418 Radiculopathy, sacral and sacrococcygeal region: Secondary | ICD-10-CM | POA: Diagnosis not present

## 2015-09-10 DIAGNOSIS — J449 Chronic obstructive pulmonary disease, unspecified: Secondary | ICD-10-CM | POA: Diagnosis not present

## 2015-09-10 DIAGNOSIS — J438 Other emphysema: Secondary | ICD-10-CM | POA: Diagnosis not present

## 2015-09-13 DIAGNOSIS — I10 Essential (primary) hypertension: Secondary | ICD-10-CM | POA: Diagnosis not present

## 2015-09-13 DIAGNOSIS — M9983 Other biomechanical lesions of lumbar region: Secondary | ICD-10-CM | POA: Diagnosis not present

## 2015-09-13 DIAGNOSIS — G4733 Obstructive sleep apnea (adult) (pediatric): Secondary | ICD-10-CM | POA: Diagnosis not present

## 2015-09-13 DIAGNOSIS — M5136 Other intervertebral disc degeneration, lumbar region: Secondary | ICD-10-CM | POA: Diagnosis not present

## 2015-09-13 DIAGNOSIS — M5416 Radiculopathy, lumbar region: Secondary | ICD-10-CM | POA: Diagnosis not present

## 2015-09-20 ENCOUNTER — Ambulatory Visit: Payer: Commercial Managed Care - HMO | Admitting: Family Medicine

## 2015-09-21 DIAGNOSIS — J449 Chronic obstructive pulmonary disease, unspecified: Secondary | ICD-10-CM | POA: Diagnosis not present

## 2015-09-23 ENCOUNTER — Other Ambulatory Visit: Payer: Self-pay | Admitting: Family Medicine

## 2015-09-24 ENCOUNTER — Ambulatory Visit (INDEPENDENT_AMBULATORY_CARE_PROVIDER_SITE_OTHER): Payer: Commercial Managed Care - HMO | Admitting: Family Medicine

## 2015-09-24 ENCOUNTER — Encounter: Payer: Self-pay | Admitting: Family Medicine

## 2015-09-24 VITALS — BP 129/78 | HR 80 | Temp 98.6°F | Resp 16 | Ht 69.0 in | Wt 241.6 lb

## 2015-09-24 DIAGNOSIS — J40 Bronchitis, not specified as acute or chronic: Secondary | ICD-10-CM | POA: Diagnosis not present

## 2015-09-24 MED ORDER — AMOXICILLIN-POT CLAVULANATE 875-125 MG PO TABS: 1.0000 | ORAL_TABLET | Freq: Two times a day (BID) | ORAL | Status: DC

## 2015-09-24 MED ORDER — GUAIFENESIN ER 600 MG PO TB12: 600.0000 mg | ORAL_TABLET | Freq: Two times a day (BID) | ORAL | Status: DC

## 2015-09-24 NOTE — Progress Notes (Signed)
Name: Megan Bolton   MRN: [id number]    DOB: [birth year]   Date:09/24/2015       Progress Note  Subjective  Chief Complaint  Chief Complaint  Patient presents with  . Cough    2 weeks    HPI  Cold x 3-4 weeks with developing cough.   Cough occ. Prod. Of yellow green sp;utum.Marland Kitchen No wheezing.   No fever.  Not feeling bad.  Uses O2 at 2L/min.  PO2 at home in low 90s. No problem-specific assessment & plan notes found for this encounter.   Past Medical History  Diagnosis Date  . Diabetes mellitus without complication   . Hypertension   . COPD (chronic obstructive pulmonary disease)   . Hyperlipidemia     Social History  Substance Use Topics  . Smoking status: Former Smoker -- 0.50 packs/day    Quit date: 04/28/2015  . Smokeless tobacco: Never Used  . Alcohol Use: No      No Known Allergies  Review of Systems  Constitutional: Negative for fever, chills, weight loss and malaise/fatigue.  Respiratory: Positive for cough and sputum production. Negative for shortness of breath and wheezing.   Cardiovascular: Negative for chest pain, palpitations and leg swelling.  Gastrointestinal: Negative for heartburn, abdominal pain and blood in stool.  Genitourinary: Negative for dysuria, urgency and frequency.  Neurological: Negative for weakness and headaches.      Objective  Filed Vitals:   09/24/15 1504  BP: 129/78  Pulse: 80  Temp: 98.6 F (37 C)  Resp: 16  Height: 5\' 9"  (1.753 m)  Weight: 241 lb 9.6 oz (109.589 kg)  SpO2: 94%     Physical Exam  Constitutional: She is well-developed, well-nourished, and in no distress. No distress.  HENT:  Head: Normocephalic and atraumatic.  Eyes: Conjunctivae and EOM are normal. Pupils are equal, round, and reactive to light. No scleral icterus.  Neck: Normal range of motion. Neck supple. No thyromegaly present.  Cardiovascular: Normal rate, regular rhythm, normal heart sounds and intact distal pulses.  Exam reveals no gallop  and no friction rub.   No murmur heard. Pulmonary/Chest: Effort normal and breath sounds normal. No respiratory distress. She has no wheezes. She has no rales.  Mild diffuse coarse breath sounds.  Musculoskeletal: She exhibits no edema.  Lymphadenopathy:    She has no cervical adenopathy.  Vitals reviewed.     Recent Results (from the past 2160 hour(s))  Urine Culture     Status: Abnormal   Collection Time: 07/29/15 12:00 AM  Result Value Ref Range   Urine Culture, Routine Final report (A)    Urine Culture result 1 Comment (A)     Comment: Escherichia coli, identified by an automated biochemical system. Greater than 100,000 colony forming units per mL    ANTIMICROBIAL SUSCEPTIBILITY Comment     Comment:       ** S = Susceptible; I = Intermediate; R = Resistant **                    P = Positive; N = Negative             MICS are expressed in micrograms per mL    Antibiotic                 RSLT#1    RSLT#2    RSLT#3    RSLT#4 Amoxicillin/Clavulanic Acid    S Ampicillin  I Cefazolin                      R Cefepime                       S Ceftriaxone                    S Cefuroxime                     R Cephalothin                    R Ciprofloxacin                  R Ertapenem                      S Gentamicin                     S Imipenem                       S Levofloxacin                   R Nitrofurantoin                 S Piperacillin                   S Tetracycline                   S Tobramycin                     S Trimethoprim/Sulfa             S   POCT Urinalysis Dipstick     Status: Normal   Collection Time: 07/29/15  1:53 PM  Result Value Ref Range   Color, UA yellow    Clarity, UA clear    Glucose, UA negative    Bilirubin, UA negative    Ketones, UA negative    Spec Grav, UA 1.010    Blood, UA negative    pH, UA 5.0    Protein, UA negative    Urobilinogen, UA negative    Nitrite, UA negative    Leukocytes, UA Negative  Negative  CULTURE, URINE COMPREHENSIVE     Status: None   Collection Time: 08/16/15 12:00 AM  Result Value Ref Range   Urine Culture, Comprehensive Final report    Result 1 Lactobacillus species     Comment: Greater than 100,000 colony forming units per mL Susceptibility not normally performed on this organism.    Result 2 Comment     Comment: Mixed urogenital flora 10,000-25,000 colony forming units per mL      Assessment & Plan  1. Bronchitis  - amoxicillin-clavulanate (AUGMENTIN) 875-125 MG tablet; Take 1 tablet by mouth 2 (two) times daily.  Dispense: 20 tablet; Refill: 0 - guaiFENesin (MUCINEX) 600 MG 12 hr tablet; Take 1 tablet (600 mg total) by mouth 2 (two) times daily.  Dispense: 20 tablet; Refill: 0

## 2015-09-27 ENCOUNTER — Other Ambulatory Visit: Payer: Self-pay | Admitting: Family Medicine

## 2015-10-10 DIAGNOSIS — J438 Other emphysema: Secondary | ICD-10-CM | POA: Diagnosis not present

## 2015-10-10 DIAGNOSIS — J449 Chronic obstructive pulmonary disease, unspecified: Secondary | ICD-10-CM | POA: Diagnosis not present

## 2015-10-13 DIAGNOSIS — I10 Essential (primary) hypertension: Secondary | ICD-10-CM | POA: Diagnosis not present

## 2015-10-13 DIAGNOSIS — G4733 Obstructive sleep apnea (adult) (pediatric): Secondary | ICD-10-CM | POA: Diagnosis not present

## 2015-10-16 ENCOUNTER — Ambulatory Visit (INDEPENDENT_AMBULATORY_CARE_PROVIDER_SITE_OTHER): Payer: Commercial Managed Care - HMO | Admitting: *Deleted

## 2015-10-16 DIAGNOSIS — Z23 Encounter for immunization: Secondary | ICD-10-CM | POA: Diagnosis not present

## 2015-10-21 DIAGNOSIS — J449 Chronic obstructive pulmonary disease, unspecified: Secondary | ICD-10-CM | POA: Diagnosis not present

## 2015-10-23 ENCOUNTER — Other Ambulatory Visit: Payer: Self-pay | Admitting: Family Medicine

## 2015-10-28 ENCOUNTER — Telehealth: Payer: Self-pay | Admitting: Family Medicine

## 2015-10-28 DIAGNOSIS — M4806 Spinal stenosis, lumbar region: Secondary | ICD-10-CM | POA: Diagnosis not present

## 2015-10-28 DIAGNOSIS — M5136 Other intervertebral disc degeneration, lumbar region: Secondary | ICD-10-CM | POA: Diagnosis not present

## 2015-10-28 DIAGNOSIS — M5416 Radiculopathy, lumbar region: Secondary | ICD-10-CM | POA: Diagnosis not present

## 2015-10-28 NOTE — Telephone Encounter (Signed)
[facility] for pulmonary appt with Dr. Raul Del on Nov 3rd for a follow up visit for COPD and cpap.  Her previous referral has expired.  Her call back number is [phone]

## 2015-10-28 NOTE — Telephone Encounter (Signed)
[place] [id number].

## 2015-10-31 DIAGNOSIS — F17210 Nicotine dependence, cigarettes, uncomplicated: Secondary | ICD-10-CM | POA: Diagnosis not present

## 2015-10-31 DIAGNOSIS — J439 Emphysema, unspecified: Secondary | ICD-10-CM | POA: Diagnosis not present

## 2015-10-31 DIAGNOSIS — G4733 Obstructive sleep apnea (adult) (pediatric): Secondary | ICD-10-CM | POA: Diagnosis not present

## 2015-10-31 DIAGNOSIS — R0609 Other forms of dyspnea: Secondary | ICD-10-CM | POA: Diagnosis not present

## 2015-10-31 DIAGNOSIS — R0902 Hypoxemia: Secondary | ICD-10-CM | POA: Diagnosis not present

## 2015-11-10 DIAGNOSIS — J449 Chronic obstructive pulmonary disease, unspecified: Secondary | ICD-10-CM | POA: Diagnosis not present

## 2015-11-10 DIAGNOSIS — J438 Other emphysema: Secondary | ICD-10-CM | POA: Diagnosis not present

## 2015-11-11 DIAGNOSIS — F1120 Opioid dependence, uncomplicated: Secondary | ICD-10-CM | POA: Diagnosis not present

## 2015-11-11 DIAGNOSIS — M545 Low back pain: Secondary | ICD-10-CM | POA: Diagnosis not present

## 2015-11-11 DIAGNOSIS — M150 Primary generalized (osteo)arthritis: Secondary | ICD-10-CM | POA: Diagnosis not present

## 2015-11-11 DIAGNOSIS — Z7151 Drug abuse counseling and surveillance of drug abuser: Secondary | ICD-10-CM | POA: Diagnosis not present

## 2015-11-11 DIAGNOSIS — Z79899 Other long term (current) drug therapy: Secondary | ICD-10-CM | POA: Diagnosis not present

## 2015-11-11 DIAGNOSIS — Z729 Problem related to lifestyle, unspecified: Secondary | ICD-10-CM | POA: Diagnosis not present

## 2015-11-11 DIAGNOSIS — M5430 Sciatica, unspecified side: Secondary | ICD-10-CM | POA: Diagnosis not present

## 2015-11-13 DIAGNOSIS — I10 Essential (primary) hypertension: Secondary | ICD-10-CM | POA: Diagnosis not present

## 2015-11-13 DIAGNOSIS — G4733 Obstructive sleep apnea (adult) (pediatric): Secondary | ICD-10-CM | POA: Diagnosis not present

## 2015-11-14 DIAGNOSIS — G4733 Obstructive sleep apnea (adult) (pediatric): Secondary | ICD-10-CM | POA: Diagnosis not present

## 2015-11-15 DIAGNOSIS — M5136 Other intervertebral disc degeneration, lumbar region: Secondary | ICD-10-CM | POA: Diagnosis not present

## 2015-11-15 DIAGNOSIS — M4806 Spinal stenosis, lumbar region: Secondary | ICD-10-CM | POA: Diagnosis not present

## 2015-11-15 DIAGNOSIS — M5416 Radiculopathy, lumbar region: Secondary | ICD-10-CM | POA: Diagnosis not present

## 2015-11-20 ENCOUNTER — Other Ambulatory Visit: Payer: Self-pay | Admitting: Family Medicine

## 2015-11-21 DIAGNOSIS — J449 Chronic obstructive pulmonary disease, unspecified: Secondary | ICD-10-CM | POA: Diagnosis not present

## 2015-12-10 DIAGNOSIS — J449 Chronic obstructive pulmonary disease, unspecified: Secondary | ICD-10-CM | POA: Diagnosis not present

## 2015-12-10 DIAGNOSIS — J438 Other emphysema: Secondary | ICD-10-CM | POA: Diagnosis not present

## 2015-12-13 DIAGNOSIS — G4733 Obstructive sleep apnea (adult) (pediatric): Secondary | ICD-10-CM | POA: Diagnosis not present

## 2015-12-13 DIAGNOSIS — I10 Essential (primary) hypertension: Secondary | ICD-10-CM | POA: Diagnosis not present

## 2015-12-16 DIAGNOSIS — M5430 Sciatica, unspecified side: Secondary | ICD-10-CM | POA: Diagnosis not present

## 2015-12-16 DIAGNOSIS — F1120 Opioid dependence, uncomplicated: Secondary | ICD-10-CM | POA: Diagnosis not present

## 2015-12-16 DIAGNOSIS — M150 Primary generalized (osteo)arthritis: Secondary | ICD-10-CM | POA: Diagnosis not present

## 2015-12-16 DIAGNOSIS — G8929 Other chronic pain: Secondary | ICD-10-CM | POA: Diagnosis not present

## 2015-12-16 DIAGNOSIS — G894 Chronic pain syndrome: Secondary | ICD-10-CM | POA: Diagnosis not present

## 2015-12-21 DIAGNOSIS — J449 Chronic obstructive pulmonary disease, unspecified: Secondary | ICD-10-CM | POA: Diagnosis not present

## 2015-12-26 ENCOUNTER — Other Ambulatory Visit: Payer: Self-pay | Admitting: *Deleted

## 2015-12-26 NOTE — Patient Outreach (Signed)
[facility]) Care Management  12/26/2015  Leona EMANUELLE NAGY 1941/12/10 [id number]  Subjective: Telephone call to patient's home number, no answer, left HIPAA compliant voice mail message, requesting call back.   Assessment: Received referral from [facility] [facility] list on 12/25/15.   Diagnosis listed on referral is DM with 1 [name] and 5 admits.  Plan: RNCM will call patient within 3 business days, for telephonic screening, if no return call.  Jenesa Foresta H. Annia Friendly, BSN, [place] [facility] Telephonic CM Phone: [phone] Fax: [phone]

## 2015-12-27 ENCOUNTER — Encounter: Payer: Self-pay | Admitting: *Deleted

## 2015-12-27 ENCOUNTER — Other Ambulatory Visit: Payer: Self-pay | Admitting: *Deleted

## 2015-12-27 NOTE — Patient Outreach (Addendum)
[place] [facility]) Care Management  12/27/2015  Megan Bolton 01-29-1941 [id number]   Subjective: Telephone call to patient.  HIPAA verified.  Patient states she is doing good.   Discussed [facility] services.   Patient verbalize understanding and states she does not have any care management service needs at this time.   Patient states she would like to receive written information about [name] services and will call [name] if she needs services in the future.    Patient states her diabetes is under control and takes Metformin as prescribed.  Patient states she does not have any pharmacy needs at this time.   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  States her pain is currently being managed by a pain management specialist and is under control.   Patient states is does not have any care coordination needs at this time.   Patient states she is able to drive and has no transportation needs. Patient states she does not have any disease education needs at this time.   Assessment: Received referral from [facility] [facility] list on 12/25/15. Diagnosis listed on referral is DM with 1 [name] and 5 admits.  Patient declined [facility] services at this time and has agreed to receive written information regarding the services.  Patient is currently able to self manage healthcare needs.   Plan: RNCM will send patient successful outreach letter, pamphlet and magnet per patient's request. RNCM will notify primary MD of case closure due patient's refusal of services and no care management service needs. RNCM will notify Megan Bolton of case closure due patient's refusal of services and no care management service needs.   Florean Hoobler H. Annia Friendly, BSN, [place] [facility] Telephonic CM Phone: [phone] Fax: [phone]

## 2016-01-10 DIAGNOSIS — J438 Other emphysema: Secondary | ICD-10-CM | POA: Diagnosis not present

## 2016-01-10 DIAGNOSIS — J449 Chronic obstructive pulmonary disease, unspecified: Secondary | ICD-10-CM | POA: Diagnosis not present

## 2016-01-13 DIAGNOSIS — G4733 Obstructive sleep apnea (adult) (pediatric): Secondary | ICD-10-CM | POA: Diagnosis not present

## 2016-01-13 DIAGNOSIS — I10 Essential (primary) hypertension: Secondary | ICD-10-CM | POA: Diagnosis not present

## 2016-01-21 ENCOUNTER — Other Ambulatory Visit: Payer: Self-pay | Admitting: Family Medicine

## 2016-01-21 DIAGNOSIS — J449 Chronic obstructive pulmonary disease, unspecified: Secondary | ICD-10-CM | POA: Diagnosis not present

## 2016-01-21 MED ORDER — FUROSEMIDE 20 MG PO TABS: 20.0000 mg | ORAL_TABLET | Freq: Every day | ORAL | Status: DC

## 2016-01-27 DIAGNOSIS — M5416 Radiculopathy, lumbar region: Secondary | ICD-10-CM | POA: Diagnosis not present

## 2016-01-27 DIAGNOSIS — M5136 Other intervertebral disc degeneration, lumbar region: Secondary | ICD-10-CM | POA: Diagnosis not present

## 2016-01-27 DIAGNOSIS — M4806 Spinal stenosis, lumbar region: Secondary | ICD-10-CM | POA: Diagnosis not present

## 2016-01-29 ENCOUNTER — Other Ambulatory Visit: Payer: Self-pay

## 2016-01-29 NOTE — Telephone Encounter (Signed)
Rx request for patient pharm.  Please approve or decline.  A Megan Bolton

## 2016-01-30 DIAGNOSIS — M4806 Spinal stenosis, lumbar region: Secondary | ICD-10-CM | POA: Diagnosis not present

## 2016-01-30 DIAGNOSIS — M5136 Other intervertebral disc degeneration, lumbar region: Secondary | ICD-10-CM | POA: Diagnosis not present

## 2016-01-30 DIAGNOSIS — M5416 Radiculopathy, lumbar region: Secondary | ICD-10-CM | POA: Diagnosis not present

## 2016-01-30 MED ORDER — ALBUTEROL SULFATE HFA 108 (90 BASE) MCG/ACT IN AERS: 2.0000 | INHALATION_SPRAY | Freq: Four times a day (QID) | RESPIRATORY_TRACT | Status: DC | PRN

## 2016-02-06 ENCOUNTER — Other Ambulatory Visit: Payer: Self-pay

## 2016-02-06 MED ORDER — GABAPENTIN 600 MG PO TABS: 600.0000 mg | ORAL_TABLET | Freq: Three times a day (TID) | ORAL | Status: DC

## 2016-02-06 NOTE — Telephone Encounter (Signed)
[facility] requested refill for patient.  Please approve or deny.

## 2016-02-10 ENCOUNTER — Telehealth: Payer: Self-pay | Admitting: Family Medicine

## 2016-02-10 ENCOUNTER — Other Ambulatory Visit: Payer: Self-pay | Admitting: Family Medicine

## 2016-02-10 DIAGNOSIS — J449 Chronic obstructive pulmonary disease, unspecified: Secondary | ICD-10-CM | POA: Diagnosis not present

## 2016-02-10 DIAGNOSIS — F1120 Opioid dependence, uncomplicated: Secondary | ICD-10-CM | POA: Diagnosis not present

## 2016-02-10 DIAGNOSIS — M5430 Sciatica, unspecified side: Secondary | ICD-10-CM | POA: Diagnosis not present

## 2016-02-10 DIAGNOSIS — J438 Other emphysema: Secondary | ICD-10-CM | POA: Diagnosis not present

## 2016-02-10 DIAGNOSIS — G894 Chronic pain syndrome: Secondary | ICD-10-CM | POA: Diagnosis not present

## 2016-02-10 DIAGNOSIS — M792 Neuralgia and neuritis, unspecified: Secondary | ICD-10-CM

## 2016-02-10 DIAGNOSIS — G8929 Other chronic pain: Secondary | ICD-10-CM | POA: Diagnosis not present

## 2016-02-10 DIAGNOSIS — M150 Primary generalized (osteo)arthritis: Secondary | ICD-10-CM | POA: Diagnosis not present

## 2016-02-10 MED ORDER — GABAPENTIN 600 MG PO TABS: 600.0000 mg | ORAL_TABLET | Freq: Three times a day (TID) | ORAL | Status: DC

## 2016-02-10 NOTE — Telephone Encounter (Signed)
Rx printed up front for pt to pick it up called and LM.

## 2016-02-10 NOTE — Telephone Encounter (Signed)
Pt needs a refill on gabapentin

## 2016-02-13 DIAGNOSIS — G4733 Obstructive sleep apnea (adult) (pediatric): Secondary | ICD-10-CM | POA: Diagnosis not present

## 2016-02-13 DIAGNOSIS — I10 Essential (primary) hypertension: Secondary | ICD-10-CM | POA: Diagnosis not present

## 2016-02-18 DIAGNOSIS — G4733 Obstructive sleep apnea (adult) (pediatric): Secondary | ICD-10-CM | POA: Diagnosis not present

## 2016-02-21 DIAGNOSIS — J449 Chronic obstructive pulmonary disease, unspecified: Secondary | ICD-10-CM | POA: Diagnosis not present

## 2016-02-25 ENCOUNTER — Encounter: Payer: Self-pay | Admitting: Family Medicine

## 2016-02-25 ENCOUNTER — Ambulatory Visit (INDEPENDENT_AMBULATORY_CARE_PROVIDER_SITE_OTHER): Payer: Commercial Managed Care - HMO | Admitting: Family Medicine

## 2016-02-25 VITALS — BP 136/81 | HR 71 | Temp 98.1°F | Resp 16 | Ht 69.0 in | Wt 242.8 lb

## 2016-02-25 DIAGNOSIS — M792 Neuralgia and neuritis, unspecified: Secondary | ICD-10-CM

## 2016-02-25 DIAGNOSIS — R350 Frequency of micturition: Secondary | ICD-10-CM

## 2016-02-25 DIAGNOSIS — N3000 Acute cystitis without hematuria: Secondary | ICD-10-CM

## 2016-02-25 DIAGNOSIS — M5116 Intervertebral disc disorders with radiculopathy, lumbar region: Secondary | ICD-10-CM

## 2016-02-25 LAB — POCT URINALYSIS DIPSTICK
Bilirubin, UA: NEGATIVE
Glucose, UA: NEGATIVE
Ketones, UA: NEGATIVE
NITRITE UA: POSITIVE
PH UA: 7.5
RBC UA: NEGATIVE
SPEC GRAV UA: 1.01
UROBILINOGEN UA: 0.2

## 2016-02-25 MED ORDER — AMOXICILLIN 500 MG PO CAPS: 500.0000 mg | ORAL_CAPSULE | Freq: Three times a day (TID) | ORAL | Status: DC

## 2016-02-25 MED ORDER — GABAPENTIN 800 MG PO TABS: ORAL_TABLET | ORAL | Status: DC

## 2016-02-25 NOTE — Progress Notes (Signed)
Name: Megan Bolton   MRN: [id number]    DOB: 1941/10/12   Date:02/25/2016       Progress Note  Subjective  Chief Complaint  Chief Complaint  Patient presents with  . Urinary Frequency    onset week pt has no abdominal pain or back pain but just has frequency    HPI Here c/o urinary frequency for past week or so.  No dysuria.  No pain.  Some odor to urine. No fever.  Wants higher dose of Gabapentin.  R leg and foot with more tingling and burning and feels dead.  Gabapentin has helped that in past. No problem-specific assessment & plan notes found for this encounter.   Past Medical History  Diagnosis Date  . Diabetes mellitus without complication ([place])   . Hypertension   . COPD (chronic obstructive pulmonary disease) ([place])   . Hyperlipidemia     Social History  Substance Use Topics  . Smoking status: Former Smoker -- 0.50 packs/day    Quit date: 04/28/2015  . Smokeless tobacco: Never Used  . Alcohol Use: No      Not on File  Review of Systems  Constitutional: Negative for fever, chills, weight loss and malaise/fatigue.  HENT: Negative for hearing loss.   Eyes: Negative for blurred vision and double vision.  Respiratory: Negative for cough, shortness of breath and wheezing.   Cardiovascular: Negative for chest pain, palpitations and leg swelling.  Gastrointestinal: Negative for heartburn, abdominal pain and blood in stool.  Genitourinary: Positive for urgency and frequency. Negative for dysuria.  Skin: Negative for rash.  Neurological: Positive for weakness. Negative for headaches.       Neuropathy R foot/leg      Objective  Filed Vitals:   02/25/16 1033  BP: 136/81  Pulse: 71  Temp: 98.1 F (36.7 C)  TempSrc: Oral  Resp: 16  Height: 5\' 9"  (1.753 m)  Weight: 242 lb 12.8 oz (110.133 kg)  SpO2: 97%     Physical Exam  Constitutional: She is oriented to person, place, and time and well-developed, well-nourished, and in no distress. No distress.   HENT:  Head: Normocephalic and atraumatic.  Neck: Normal range of motion. Carotid bruit is not present. No thyromegaly present.  Cardiovascular: Normal rate, regular rhythm and normal heart sounds.  Exam reveals no gallop and no friction rub.   No murmur heard. Pulmonary/Chest: Effort normal. No respiratory distress. She has no wheezes. She has no rales.  Abdominal: Soft. Bowel sounds are normal. She exhibits no distension and no mass. There is no tenderness.  No CVA tenderness  Musculoskeletal: She exhibits no edema.  Lymphadenopathy:    She has no cervical adenopathy.  Neurological: She is alert and oriented to person, place, and time.  Some decreased sensation in R foot/leg.  Mild tingling/burning today.  Vitals reviewed.     Recent Results (from the past 2160 hour(s))  POCT urinalysis dipstick     Status: Abnormal   Collection Time: 02/25/16 10:35 AM  Result Value Ref Range   Color, UA yellow    Clarity, UA clear    Glucose, UA neg    Bilirubin, UA neg    Ketones, UA neg    Spec Grav, UA 1.010    Blood, UA neg    pH, UA 7.5    Protein, UA trace    Urobilinogen, UA 0.2    Nitrite, UA pos    Leukocytes, UA moderate (2+) (A) Negative     Assessment &  Plan  1. Frequency of urination  - POCT urinalysis dipstick- + leukocytes and nitrites - Urine Culture  2. Acute cystitis without hematuria  - amoxicillin (AMOXIL) 500 MG capsule; Take 1 capsule (500 mg total) by mouth 3 (three) times daily.  Dispense: 21 capsule; Refill: 0  3. Neuritis or radiculitis due to rupture of lumbar intervertebral disc   4. Neuropathic pain  - gabapentin (NEURONTIN) 800 MG tablet; Take 1 tablet three times a day.  Dispense: 270 tablet; Refill: 3

## 2016-02-26 NOTE — Addendum Note (Signed)
Addended by: Frederich Cha D on: 02/26/2016 09:28 AM   Modules accepted: Miquel Dunn

## 2016-02-27 ENCOUNTER — Other Ambulatory Visit: Payer: Self-pay | Admitting: Family Medicine

## 2016-02-27 DIAGNOSIS — R0602 Shortness of breath: Secondary | ICD-10-CM | POA: Diagnosis not present

## 2016-02-27 LAB — URINE CULTURE

## 2016-02-27 MED ORDER — NITROFURANTOIN MONOHYD MACRO 100 MG PO CAPS: 100.0000 mg | ORAL_CAPSULE | Freq: Two times a day (BID) | ORAL | Status: DC

## 2016-02-28 ENCOUNTER — Emergency Department: Payer: Commercial Managed Care - HMO

## 2016-02-28 ENCOUNTER — Inpatient Hospital Stay
Admission: EM | Admit: 2016-02-28 | Discharge: 2016-03-01 | DRG: 190 | Disposition: A | Discharge status: Home / Self Care | Payer: Commercial Managed Care - HMO | Attending: Internal Medicine | Admitting: Internal Medicine

## 2016-02-28 ENCOUNTER — Encounter: Payer: Self-pay | Admitting: Internal Medicine

## 2016-02-28 DIAGNOSIS — J189 Pneumonia, unspecified organism: Secondary | ICD-10-CM | POA: Diagnosis present

## 2016-02-28 DIAGNOSIS — R0600 Dyspnea, unspecified: Secondary | ICD-10-CM | POA: Diagnosis not present

## 2016-02-28 DIAGNOSIS — Z9981 Dependence on supplemental oxygen: Secondary | ICD-10-CM

## 2016-02-28 DIAGNOSIS — B9620 Unspecified Escherichia coli [E. coli] as the cause of diseases classified elsewhere: Secondary | ICD-10-CM | POA: Diagnosis not present

## 2016-02-28 DIAGNOSIS — Z6838 Body mass index (BMI) 38.0-38.9, adult: Secondary | ICD-10-CM | POA: Diagnosis not present

## 2016-02-28 DIAGNOSIS — Z7984 Long term (current) use of oral hypoglycemic drugs: Secondary | ICD-10-CM | POA: Diagnosis not present

## 2016-02-28 DIAGNOSIS — R0902 Hypoxemia: Secondary | ICD-10-CM

## 2016-02-28 DIAGNOSIS — J9601 Acute respiratory failure with hypoxia: Secondary | ICD-10-CM | POA: Diagnosis not present

## 2016-02-28 DIAGNOSIS — J9621 Acute and chronic respiratory failure with hypoxia: Secondary | ICD-10-CM | POA: Diagnosis not present

## 2016-02-28 DIAGNOSIS — J440 Chronic obstructive pulmonary disease with acute lower respiratory infection: Secondary | ICD-10-CM | POA: Diagnosis not present

## 2016-02-28 DIAGNOSIS — G8929 Other chronic pain: Secondary | ICD-10-CM | POA: Diagnosis present

## 2016-02-28 DIAGNOSIS — N390 Urinary tract infection, site not specified: Secondary | ICD-10-CM | POA: Diagnosis present

## 2016-02-28 DIAGNOSIS — Z87891 Personal history of nicotine dependence: Secondary | ICD-10-CM

## 2016-02-28 DIAGNOSIS — J441 Chronic obstructive pulmonary disease with (acute) exacerbation: Secondary | ICD-10-CM | POA: Diagnosis present

## 2016-02-28 DIAGNOSIS — N183 Chronic kidney disease, stage 3 (moderate): Secondary | ICD-10-CM | POA: Diagnosis present

## 2016-02-28 DIAGNOSIS — E785 Hyperlipidemia, unspecified: Secondary | ICD-10-CM | POA: Diagnosis present

## 2016-02-28 DIAGNOSIS — R0989 Other specified symptoms and signs involving the circulatory and respiratory systems: Secondary | ICD-10-CM

## 2016-02-28 DIAGNOSIS — A419 Sepsis, unspecified organism: Secondary | ICD-10-CM

## 2016-02-28 DIAGNOSIS — I129 Hypertensive chronic kidney disease with stage 1 through stage 4 chronic kidney disease, or unspecified chronic kidney disease: Secondary | ICD-10-CM | POA: Diagnosis not present

## 2016-02-28 DIAGNOSIS — Z1624 Resistance to multiple antibiotics: Secondary | ICD-10-CM | POA: Diagnosis present

## 2016-02-28 DIAGNOSIS — E1140 Type 2 diabetes mellitus with diabetic neuropathy, unspecified: Secondary | ICD-10-CM | POA: Diagnosis not present

## 2016-02-28 DIAGNOSIS — Z8249 Family history of ischemic heart disease and other diseases of the circulatory system: Secondary | ICD-10-CM | POA: Diagnosis not present

## 2016-02-28 DIAGNOSIS — Z7951 Long term (current) use of inhaled steroids: Secondary | ICD-10-CM

## 2016-02-28 DIAGNOSIS — R0602 Shortness of breath: Secondary | ICD-10-CM | POA: Diagnosis not present

## 2016-02-28 DIAGNOSIS — Z79899 Other long term (current) drug therapy: Secondary | ICD-10-CM

## 2016-02-28 LAB — COMPREHENSIVE METABOLIC PANEL
ALBUMIN: 3.8 g/dL (ref 3.5–5.0)
ALK PHOS: 67 U/L (ref 38–126)
ALT: 21 U/L (ref 14–54)
AST: 43 U/L — AB (ref 15–41)
Anion gap: 7 (ref 5–15)
BUN: 25 mg/dL — ABNORMAL HIGH (ref 6–20)
CALCIUM: 9.2 mg/dL (ref 8.9–10.3)
CO2: 31 mmol/L (ref 22–32)
CREATININE: 1.04 mg/dL — AB (ref 0.44–1.00)
Chloride: 99 mmol/L — ABNORMAL LOW (ref 101–111)
GFR calc Af Amer: 60 mL/min — ABNORMAL LOW (ref >60)
GFR calc non Af Amer: 52 mL/min — ABNORMAL LOW (ref >60)
GLUCOSE: 212 mg/dL — AB (ref 65–99)
Potassium: 3.9 mmol/L (ref 3.5–5.1)
SODIUM: 137 mmol/L (ref 135–145)
Total Bilirubin: 0.7 mg/dL (ref 0.3–1.2)
Total Protein: 7.1 g/dL (ref 6.5–8.1)

## 2016-02-28 LAB — BASIC METABOLIC PANEL
Anion gap: 7 (ref 5–15)
BUN: 30 mg/dL — AB (ref 6–20)
CALCIUM: 8.9 mg/dL (ref 8.9–10.3)
CO2: 30 mmol/L (ref 22–32)
CREATININE: 1.12 mg/dL — AB (ref 0.44–1.00)
Chloride: 99 mmol/L — ABNORMAL LOW (ref 101–111)
GFR calc Af Amer: 55 mL/min — ABNORMAL LOW (ref >60)
GFR, EST NON AFRICAN AMERICAN: 47 mL/min — AB (ref >60)
Glucose, Bld: 326 mg/dL — ABNORMAL HIGH (ref 65–99)
Potassium: 4.7 mmol/L (ref 3.5–5.1)
SODIUM: 136 mmol/L (ref 135–145)

## 2016-02-28 LAB — GLUCOSE, CAPILLARY
GLUCOSE-CAPILLARY: 273 mg/dL — AB (ref 65–99)
GLUCOSE-CAPILLARY: 277 mg/dL — AB (ref 65–99)
GLUCOSE-CAPILLARY: 230 mg/dL — AB (ref 65–99)
Glucose-Capillary: 296 mg/dL — ABNORMAL HIGH (ref 65–99)
Glucose-Capillary: 314 mg/dL — ABNORMAL HIGH (ref 65–99)

## 2016-02-28 LAB — CBC WITH DIFFERENTIAL/PLATELET
BASOS PCT: 0 %
Basophils Absolute: 0.1 10*3/uL (ref 0–0.1)
EOS ABS: 0.5 10*3/uL (ref 0–0.7)
Eosinophils Relative: 3 %
HCT: 39.5 % (ref 35.0–47.0)
HEMOGLOBIN: 12.9 g/dL (ref 12.0–16.0)
LYMPHS PCT: 4 %
Lymphs Abs: 0.9 10*3/uL — ABNORMAL LOW (ref 1.0–3.6)
MCH: 29.4 pg (ref 26.0–34.0)
MCHC: 32.8 g/dL (ref 32.0–36.0)
MCV: 89.6 fL (ref 80.0–100.0)
MONOS PCT: 7 %
Monocytes Absolute: 1.4 10*3/uL — ABNORMAL HIGH (ref 0.2–0.9)
NEUTROS ABS: 18.7 10*3/uL — AB (ref 1.4–6.5)
NEUTROS PCT: 86 %
Platelets: 171 10*3/uL (ref 150–440)
RBC: 4.41 MIL/uL (ref 3.80–5.20)
RDW: 13.2 % (ref 11.5–14.5)
WBC: 21.6 10*3/uL — ABNORMAL HIGH (ref 3.6–11.0)

## 2016-02-28 LAB — CBC
HCT: 38.3 % (ref 35.0–47.0)
Hemoglobin: 12.4 g/dL (ref 12.0–16.0)
MCH: 28.8 pg (ref 26.0–34.0)
MCHC: 32.4 g/dL (ref 32.0–36.0)
MCV: 88.8 fL (ref 80.0–100.0)
PLATELETS: 169 10*3/uL (ref 150–440)
RBC: 4.31 MIL/uL (ref 3.80–5.20)
RDW: 13.3 % (ref 11.5–14.5)
WBC: 24.5 10*3/uL — ABNORMAL HIGH (ref 3.6–11.0)

## 2016-02-28 LAB — MRSA PCR SCREENING: MRSA by PCR: NEGATIVE

## 2016-02-28 LAB — HEMOGLOBIN A1C: HEMOGLOBIN A1C: 7.1 % — AB (ref 4.0–6.0)

## 2016-02-28 LAB — TROPONIN I

## 2016-02-28 LAB — LACTIC ACID, PLASMA
LACTIC ACID, VENOUS: 1.6 mmol/L (ref 0.5–2.0)
Lactic Acid, Venous: 1.7 mmol/L (ref 0.5–2.0)

## 2016-02-28 LAB — BRAIN NATRIURETIC PEPTIDE: B Natriuretic Peptide: 172 pg/mL — ABNORMAL HIGH (ref 0.0–100.0)

## 2016-02-28 MED ORDER — IPRATROPIUM BROMIDE 0.02 % IN SOLN
0.5000 mg | Freq: Four times a day (QID) | RESPIRATORY_TRACT | Status: DC
  Administered 2016-02-28 – 2016-03-01 (×9): 0.5 mg via RESPIRATORY_TRACT
  Filled 2016-02-28 (×9): qty 2.5

## 2016-02-28 MED ORDER — ONDANSETRON HCL 4 MG PO TABS: 4.0000 mg | ORAL_TABLET | Freq: Four times a day (QID) | ORAL | Status: DC | PRN

## 2016-02-28 MED ORDER — CHLORHEXIDINE GLUCONATE 0.12 % MT SOLN
15.0000 mL | Freq: Two times a day (BID) | OROMUCOSAL | Status: DC
  Administered 2016-02-28 – 2016-03-01 (×3): 15 mL via OROMUCOSAL
  Filled 2016-02-28 (×2): qty 15

## 2016-02-28 MED ORDER — CARVEDILOL 12.5 MG PO TABS
25.0000 mg | ORAL_TABLET | Freq: Two times a day (BID) | ORAL | Status: DC
  Administered 2016-02-28 – 2016-02-29 (×3): 25 mg via ORAL
  Filled 2016-02-28 (×4): qty 2
  Filled 2016-02-28 (×2): qty 1

## 2016-02-28 MED ORDER — INSULIN ASPART 100 UNIT/ML ~~LOC~~ SOLN
0.0000 [IU] | Freq: Three times a day (TID) | SUBCUTANEOUS | Status: DC
  Administered 2016-02-28: 7 [IU] via SUBCUTANEOUS
  Administered 2016-02-28: 15 [IU] via SUBCUTANEOUS
  Administered 2016-02-28: 11 [IU] via SUBCUTANEOUS
  Administered 2016-02-29: 7 [IU] via SUBCUTANEOUS
  Administered 2016-02-29: 4 [IU] via SUBCUTANEOUS
  Administered 2016-02-29: 10 [IU] via SUBCUTANEOUS
  Administered 2016-03-01: 7 [IU] via SUBCUTANEOUS
  Administered 2016-03-01: 4 [IU] via SUBCUTANEOUS
  Filled 2016-02-28: qty 4
  Filled 2016-02-28 (×2): qty 7
  Filled 2016-02-28: qty 15
  Filled 2016-02-28: qty 11
  Filled 2016-02-28 (×2): qty 7
  Filled 2016-02-28: qty 4

## 2016-02-28 MED ORDER — INSULIN GLARGINE 100 UNIT/ML ~~LOC~~ SOLN
11.0000 [IU] | Freq: Every day | SUBCUTANEOUS | Status: DC
  Administered 2016-02-28 – 2016-02-29 (×2): 11 [IU] via SUBCUTANEOUS
  Filled 2016-02-28 (×4): qty 0.11

## 2016-02-28 MED ORDER — OXYCODONE HCL 5 MG PO TABS
5.0000 mg | ORAL_TABLET | Freq: Three times a day (TID) | ORAL | Status: DC
  Administered 2016-02-28 – 2016-03-01 (×5): 5 mg via ORAL
  Filled 2016-02-28 (×5): qty 1

## 2016-02-28 MED ORDER — INSULIN ASPART 100 UNIT/ML ~~LOC~~ SOLN
3.0000 [IU] | Freq: Three times a day (TID) | SUBCUTANEOUS | Status: DC
  Administered 2016-02-28 – 2016-03-01 (×6): 3 [IU] via SUBCUTANEOUS
  Filled 2016-02-28 (×6): qty 3

## 2016-02-28 MED ORDER — AZITHROMYCIN 500 MG IV SOLR
500.0000 mg | INTRAVENOUS | Status: DC
Start: 2016-02-28 — End: 2016-03-01
  Administered 2016-02-29: 500 mg via INTRAVENOUS
  Filled 2016-02-28 (×3): qty 500

## 2016-02-28 MED ORDER — SODIUM CHLORIDE 0.9% FLUSH: 3.0000 mL | INTRAVENOUS | Status: DC | PRN

## 2016-02-28 MED ORDER — DEXTROSE 5 % IV SOLN
500.0000 mg | INTRAVENOUS | Status: AC
  Administered 2016-02-28: 500 mg via INTRAVENOUS
  Filled 2016-02-28: qty 500

## 2016-02-28 MED ORDER — IPRATROPIUM-ALBUTEROL 0.5-2.5 (3) MG/3ML IN SOLN
3.0000 mL | Freq: Once | RESPIRATORY_TRACT | Status: AC
  Administered 2016-02-28: 3 mL via RESPIRATORY_TRACT

## 2016-02-28 MED ORDER — INDAPAMIDE 2.5 MG PO TABS
1.2500 mg | ORAL_TABLET | Freq: Every day | ORAL | Status: DC
  Administered 2016-02-28 – 2016-03-01 (×3): 1.25 mg via ORAL
  Filled 2016-02-28 (×5): qty 1

## 2016-02-28 MED ORDER — ENOXAPARIN SODIUM 40 MG/0.4ML ~~LOC~~ SOLN
40.0000 mg | Freq: Every day | SUBCUTANEOUS | Status: DC
  Administered 2016-02-28 – 2016-03-01 (×3): 40 mg via SUBCUTANEOUS
  Filled 2016-02-28 (×2): qty 0.4

## 2016-02-28 MED ORDER — ACETAMINOPHEN 325 MG PO TABS: 650.0000 mg | ORAL_TABLET | Freq: Four times a day (QID) | ORAL | Status: DC | PRN

## 2016-02-28 MED ORDER — ACETAMINOPHEN 650 MG RE SUPP: 650.0000 mg | Freq: Four times a day (QID) | RECTAL | Status: DC | PRN

## 2016-02-28 MED ORDER — METHYLPREDNISOLONE SODIUM SUCC 125 MG IJ SOLR
60.0000 mg | Freq: Four times a day (QID) | INTRAMUSCULAR | Status: DC
  Administered 2016-02-28 (×2): 60 mg via INTRAVENOUS
  Filled 2016-02-28 (×2): qty 2

## 2016-02-28 MED ORDER — LISINOPRIL 20 MG PO TABS
40.0000 mg | ORAL_TABLET | Freq: Every day | ORAL | Status: DC
  Administered 2016-02-28 – 2016-03-01 (×2): 40 mg via ORAL
  Filled 2016-02-28 (×4): qty 2

## 2016-02-28 MED ORDER — POTASSIUM CHLORIDE CRYS ER 10 MEQ PO TBCR
10.0000 meq | EXTENDED_RELEASE_TABLET | Freq: Every day | ORAL | Status: DC
Start: 2016-02-28 — End: 2016-03-01
  Administered 2016-02-28 – 2016-03-01 (×3): 10 meq via ORAL
  Filled 2016-02-28 (×3): qty 1

## 2016-02-28 MED ORDER — METHYLPREDNISOLONE SODIUM SUCC 125 MG IJ SOLR
125.0000 mg | INTRAMUSCULAR | Status: AC
  Administered 2016-02-28: 125 mg via INTRAVENOUS

## 2016-02-28 MED ORDER — FUROSEMIDE 40 MG PO TABS
20.0000 mg | ORAL_TABLET | Freq: Every day | ORAL | Status: DC
  Administered 2016-02-28 – 2016-02-29 (×2): 20 mg via ORAL
  Filled 2016-02-28 (×2): qty 1

## 2016-02-28 MED ORDER — MONTELUKAST SODIUM 10 MG PO TABS
10.0000 mg | ORAL_TABLET | Freq: Every day | ORAL | Status: DC
  Administered 2016-02-28 – 2016-02-29 (×2): 10 mg via ORAL
  Filled 2016-02-28 (×2): qty 1

## 2016-02-28 MED ORDER — SODIUM CHLORIDE 0.9% FLUSH
3.0000 mL | Freq: Two times a day (BID) | INTRAVENOUS | Status: DC
  Administered 2016-02-28 – 2016-03-01 (×5): 3 mL via INTRAVENOUS

## 2016-02-28 MED ORDER — CEFTRIAXONE SODIUM 1 G IJ SOLR
1.0000 g | INTRAMUSCULAR | Status: AC
  Filled 2016-02-28: qty 10

## 2016-02-28 MED ORDER — SODIUM CHLORIDE 0.9 % IV SOLN: 250.0000 mL | INTRAVENOUS | Status: DC | PRN

## 2016-02-28 MED ORDER — SODIUM CHLORIDE 0.9% FLUSH
3.0000 mL | Freq: Two times a day (BID) | INTRAVENOUS | Status: DC
  Administered 2016-02-28: 3 mL via INTRAVENOUS

## 2016-02-28 MED ORDER — METHYLPREDNISOLONE SODIUM SUCC 125 MG IJ SOLR
60.0000 mg | Freq: Two times a day (BID) | INTRAMUSCULAR | Status: DC
  Administered 2016-02-28 – 2016-02-29 (×3): 60 mg via INTRAVENOUS
  Filled 2016-02-28 (×3): qty 2

## 2016-02-28 MED ORDER — IPRATROPIUM-ALBUTEROL 0.5-2.5 (3) MG/3ML IN SOLN
RESPIRATORY_TRACT | Status: AC
  Administered 2016-02-28: 3 mL via RESPIRATORY_TRACT
  Filled 2016-02-28: qty 9

## 2016-02-28 MED ORDER — ALBUTEROL SULFATE (2.5 MG/3ML) 0.083% IN NEBU
2.5000 mg | INHALATION_SOLUTION | RESPIRATORY_TRACT | Status: DC | PRN
  Filled 2016-02-28: qty 3

## 2016-02-28 MED ORDER — DEXTROSE 5 % IV SOLN
1.0000 g | INTRAVENOUS | Status: DC
  Administered 2016-02-28 – 2016-02-29 (×2): 1 g via INTRAVENOUS
  Filled 2016-02-28: qty 10

## 2016-02-28 MED ORDER — SENNOSIDES-DOCUSATE SODIUM 8.6-50 MG PO TABS: 1.0000 | ORAL_TABLET | Freq: Every evening | ORAL | Status: DC | PRN

## 2016-02-28 MED ORDER — CEFTRIAXONE SODIUM 2 G IJ SOLR: 2.0000 g | Freq: Once | INTRAMUSCULAR | Status: DC

## 2016-02-28 MED ORDER — HYDROCODONE-ACETAMINOPHEN 5-325 MG PO TABS: 1.0000 | ORAL_TABLET | ORAL | Status: DC | PRN

## 2016-02-28 MED ORDER — CETYLPYRIDINIUM CHLORIDE 0.05 % MT LIQD
7.0000 mL | Freq: Two times a day (BID) | OROMUCOSAL | Status: DC
  Administered 2016-02-28 – 2016-02-29 (×3): 7 mL via OROMUCOSAL

## 2016-02-28 MED ORDER — GABAPENTIN 400 MG PO CAPS
800.0000 mg | ORAL_CAPSULE | Freq: Three times a day (TID) | ORAL | Status: DC
  Administered 2016-02-28 – 2016-03-01 (×6): 800 mg via ORAL
  Filled 2016-02-28 (×7): qty 2

## 2016-02-28 MED ORDER — INSULIN ASPART 100 UNIT/ML ~~LOC~~ SOLN
0.0000 [IU] | Freq: Every day | SUBCUTANEOUS | Status: DC
  Administered 2016-02-28: 2 [IU] via SUBCUTANEOUS
  Filled 2016-02-28: qty 2

## 2016-02-28 MED ORDER — METHYLPREDNISOLONE SODIUM SUCC 125 MG IJ SOLR
INTRAMUSCULAR | Status: AC
  Administered 2016-02-28: 125 mg via INTRAVENOUS
  Filled 2016-02-28: qty 2

## 2016-02-28 MED ORDER — ONDANSETRON HCL 4 MG/2ML IJ SOLN: 4.0000 mg | Freq: Four times a day (QID) | INTRAMUSCULAR | Status: DC | PRN

## 2016-02-28 MED ORDER — METFORMIN HCL 500 MG PO TABS
500.0000 mg | ORAL_TABLET | Freq: Every day | ORAL | Status: DC
  Administered 2016-02-28 – 2016-03-01 (×3): 500 mg via ORAL
  Filled 2016-02-28 (×3): qty 1

## 2016-02-28 NOTE — ED Notes (Signed)
X-ray at bedside

## 2016-02-28 NOTE — Progress Notes (Signed)
Bipap is on standby. Pt is in no distress at this time and doesn't wish to use the Bipap

## 2016-02-28 NOTE — Progress Notes (Signed)
Inpatient Diabetes Program Recommendations  AACE/ADA: New Consensus Statement on Inpatient Glycemic Control (2015)  Target Ranges:  Prepandial:   less than 140 mg/dL      Peak postprandial:   less than 180 mg/dL (1-2 hours)      Critically ill patients:  140 - 180 mg/dL   Review of Glycemic Control  Results for KANISE, GLACE (MRN [id number]) as of 02/28/2016 12:20  Ref. Range 02/28/2016 05:34 02/28/2016 07:38  Glucose-Capillary Latest Ref Range: 65-99 mg/dL 273 (H) 296 (H)    Diabetes history: Type 2 Outpatient Diabetes medications: Metformin 500mg  qday Current orders for Inpatient glycemic control: Novolog 0-5 units qhs, Novolog 0-20 units tid, Metformin 500mg  qam  Inpatient Diabetes Program Recommendations:  Please consider obtaining an A1c, and starting Lantus 11 units qhs.  Consider starting Novolog 3 units tid with meals (hold if she eats less than 50%) while she is on steroids- taper insulin when steroids are decreased.  Gentry Fitz, RN, BA, MHA, CDE Diabetes Coordinator Inpatient Diabetes Program  [phone] (Team Pager) [phone] ([place]) 02/28/2016 12:24 PM

## 2016-02-28 NOTE — Care Management (Signed)
Patient admitted to icu due to the need of continuous bipap. She is currently on nasal cannula and anticipate to move out of icu today.  She has chronic 02 through [employer] and cpap.  Able to perform her own adls, denies issues accessing medical care and obtaining meds. One of her adult daughters live in the home.   Confirms her address and phone number. Is current with her pcp Dr Luan Pulling.  She would be agreeable for home health nurse if physician feels it is needed.  There would be no agency preference

## 2016-02-28 NOTE — Progress Notes (Signed)
[place] [facility] [place] NAME: Megan Bolton    MR#:  [id number]  DATE OF BIRTH:  Aug 06, 1941  SUBJECTIVE:   Patient here due to shortness of breath and noted to be in acute respiratory failure due to COPD exacerbation. Off BiPAP this morning and feels better. Family at bedside.  REVIEW OF SYSTEMS:    Review of Systems  Constitutional: Negative for fever and chills.  HENT: Negative for congestion and tinnitus.   Eyes: Negative for blurred vision and double vision.  Respiratory: Positive for cough and shortness of breath. Negative for wheezing.   Cardiovascular: Negative for chest pain, orthopnea and PND.  Gastrointestinal: Negative for nausea, vomiting, abdominal pain and diarrhea.  Genitourinary: Negative for dysuria and hematuria.  Neurological: Negative for dizziness, sensory change and focal weakness.  All other systems reviewed and are negative.   Nutrition: heart healthy/Carb control. Tolerating Diet: yes Tolerating PT: Await Eval   DRUG ALLERGIES:  No Known Allergies  VITALS:  Blood pressure 146/75, pulse 77, temperature 97.8 F (36.6 C), temperature source Axillary, resp. rate 12, height 5\' 7"  (1.702 m), weight 110 kg (242 lb 8.1 oz), SpO2 92 %.  PHYSICAL EXAMINATION:   Physical Exam  GENERAL:  75 y.o.-year-old obese patient lying in the bed with no acute distress.  EYES: Pupils equal, round, reactive to light and accommodation. No scleral icterus. Extraocular muscles intact.  HEENT: Head atraumatic, normocephalic. Oropharynx and nasopharynx clear.  NECK:  Supple, no jugular venous distention. No thyroid enlargement, no tenderness.  LUNGS: Prolonged insp & exp phase, minimal wheezing, No rales, rhonchi. No use of accessory muscles of respiration.  CARDIOVASCULAR: S1, S2 normal. No murmurs, rubs, or gallops.  ABDOMEN: Soft, nontender, nondistended. Bowel sounds present. No organomegaly or mass.  EXTREMITIES: No  cyanosis, clubbing or edema b/l.    NEUROLOGIC: Cranial nerves II through XII are intact. No focal Motor or sensory deficits b/l.   PSYCHIATRIC: The patient is alert and oriented x 3.  SKIN: No obvious rash, lesion, or ulcer.    LABORATORY PANEL:   CBC  Recent Labs Lab 02/28/16 0759  WBC 24.5*  HGB 12.4  HCT 38.3  PLT 169   ------------------------------------------------------------------------------------------------------------------  Chemistries   Recent Labs Lab 02/28/16 0130 02/28/16 0759  NA 137 136  K 3.9 4.7  CL 99* 99*  CO2 31 30  GLUCOSE 212* 326*  BUN 25* 30*  CREATININE 1.04* 1.12*  CALCIUM 9.2 8.9  AST 43*  --   ALT 21  --   ALKPHOS 67  --   BILITOT 0.7  --    ------------------------------------------------------------------------------------------------------------------  Cardiac Enzymes  Recent Labs Lab 02/28/16 0130  TROPONINI <0.03   ------------------------------------------------------------------------------------------------------------------  RADIOLOGY:  Dg Chest Port 1 View  02/28/2016  CLINICAL DATA:  Acute onset of shortness of breath and chills. Initial encounter. EXAM: PORTABLE CHEST 1 VIEW COMPARISON:  Chest radiograph performed 07/12/2015 FINDINGS: The lungs are well-aerated. Vascular congestion is noted. Mild bibasilar opacities may reflect pneumonia, given the patient's symptoms. There is no evidence of pleural effusion or pneumothorax. The cardiomediastinal silhouette is mildly enlarged. No acute osseous abnormalities are seen. IMPRESSION: Vascular congestion and mild cardiomegaly. Mild bibasilar opacities may reflect pneumonia, given the patient's symptoms. Electronically Signed   By: Garald Balding M.D.   On: 02/28/2016 02:18     ASSESSMENT AND PLAN:   75 year old female with past medical history of diabetes, morbid obesity, hypertension, COPD who presented to the hospital due to  shortness of breath and noted to be in COPD  exacerbation.  #1 COPD exacerbation-off BiPAP and clinically improving. -Continue IV steroids, but will taper, continue duo nebs -Continue empiric ceftriaxone, Zithromax. -Patient already on oxygen at home.  #2 diabetes type 2 without consultation-patient's blood sugars uncontrolled due to IV steroids. -Continue Levemir, sliding scale insulin and NovoLog with meals, metformin. We'll taper steroids. -Appreciate diabetes coordinator input.  #3 history of CHF-clinically patient is on congestive heart failure. -Continue Coreg, Lasix, lisinopril.  #4 essential hypertension-continue Coreg, lisinopril.  #5 diabetic neuropathy-continue gabapentin.  #6 chronic pain-continue hydrocodone as needed.   All the records are reviewed and case discussed with Care Management/Social Workerr. Management plans discussed with the patient, family and they are in agreement.  CODE STATUS: Full  DVT Prophylaxis: Lovenox  TOTAL TIME TAKING CARE OF THIS PATIENT: 30 minutes.   POSSIBLE D/C IN 1-2 DAYS, DEPENDING ON CLINICAL CONDITION.   Henreitta Leber M.D on 02/28/2016 at 4:44 PM  Between 7am to 6pm - Pager - [phone]  After 6pm go to [email or web address] - password [email or web address] [facility]  Office  [phone]  CC: Primary care physician; Dicky Doe, MD

## 2016-02-28 NOTE — ED Notes (Signed)
RT at bedside to place patient on BiPAP.

## 2016-02-28 NOTE — ED Provider Notes (Signed)
[facility] Emergency Department Provider Note  ____________________________________________  Time seen: Approximately 1:27 AM  I have reviewed the triage vital signs and the nursing notes.   HISTORY  Chief Complaint Shortness of Breath and Medication Reaction  EM caveat: Acuity and hypoxia limit evaluation  HPI Megan Bolton is a 75 y.o. female presents for evaluation of dyspnea.  Present her shortness of breath. She able to tell me that today she notes her oxygen levels Loc started feeling short of breath this evening. She's had a slight cough, but denies fevers or chills. She normally wears 2 L of oxygen and was noted to be 77% on 4 L on arrival to the hospital.  She does report she was recently placed on Macrobid" for a UTI for which she saw her primary care doctor today, but she has not had a rash, itching, swelling, nausea vomiting or other symptoms. Denies chest pain but does feel "tight" across his chest.    Past Medical History  Diagnosis Date  . Diabetes mellitus without complication ([place])   . Hypertension   . COPD (chronic obstructive pulmonary disease) ([place])   . Hyperlipidemia     Patient Active Problem List   Diagnosis Date Noted  . [name] (chronic kidney disease) 07/12/2015  . Diabetes mellitus, type 2 ([place]) 06/11/2015  . Arthritis, degenerative 06/11/2015  . Hypercholesteremia 06/11/2015  . BP (high blood pressure) 06/11/2015  . 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 Injury of kidney 04/14/2015  . Acute exacerbation of chronic obstructive airways disease ([place]) 04/14/2015  . Essential (primary) hypertension 04/14/2015  . Blood glucose elevated 04/14/2015  . Apnea, sleep 04/14/2015  . DDD (degenerative disc disease), lumbar 08/01/2014  . Lumbar canal stenosis 05/11/2014  . Neuritis or radiculitis due to rupture of lumbar intervertebral disc 05/11/2014  . 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  Lumbosacral spondylosis 05/11/2014  . Thoracic and lumbosacral neuritis 05/11/2014  . Accelerating angina ([place]) 03/13/2014    Past Surgical History  Procedure Laterality Date  . Abdominal hysterectomy    . Spine surgery        Allergies Review of patient's allergies indicates no known allergies.  Family History  Problem Relation Age of Onset  . Hypertension Paternal Grandfather     Social History Social History  Substance Use Topics  . Smoking status: Former Smoker -- 0.50 packs/day    Quit date: 04/28/2015  . Smokeless tobacco: Never Used  . Alcohol Use: No    Review of Systems Constitutional: Fever and chills Eyes: No visual changes. ENT: No sore throat. Cardiovascular: See history of present illness  Respiratory: See history of present illness Gastrointestinal: No abdominal pain.  No nausea, no vomiting.  No diarrhea.  No constipation. Genitourinary: Negative for dysuria. Musculoskeletal: Negative for back pain. Skin: Negative for rash. Neurological: Negative for headaches, focal weakness or numbness.  10-point ROS otherwise negative.  ____________________________________________   PHYSICAL EXAM:  VITAL SIGNS: ED Triage Vitals  Enc Vitals Group     BP --      Pulse --      Resp --      Temp --      Temp src --      SpO2 --      Weight --      Height --      Head Cir --      Peak Flow --      Pain Score --  Pain Loc --      Pain Edu? --      Excl. in [place]? --    Constitutional: Alert and oriented. Appears in moderate distress, tachypnea, and sitting upright. Very barrel chested.  Eyes: Conjunctivae are normal. PERRL. EOMI. Head: Atraumatic. Nose: No congestion/rhinnorhea. Mouth/Throat: Mucous membranes are moist.  Oropharynx non-erythematous. Neck: No stridor.   Cardiovascular: Slightly tachycardic rate, regular rhythm. Grossly normal heart sounds.  Good peripheral circulation. Respiratory: Normal respiratory effort.  No retractions. Lungs  CTAB. Gastrointestinal: Soft and nontender. No distention. No abdominal bruits. No CVA tenderness. Musculoskeletal: No lower extremity tenderness nor edema.  No joint effusions. No venous congestion or cords. Neurologic:  Normal speech and language. No gross focal neurologic deficits are appreciated. Skin:  Skin is warm, dry and intact. No rash noted. Psychiatric: Mood and affect are normal. Speech and behavior are normal.  ____________________________________________   LABS (all labs ordered are listed, but only abnormal results are displayed)  Labs Reviewed  CBC WITH DIFFERENTIAL/PLATELET - Abnormal; Notable for the following:    WBC 21.6 (*)    Neutro Abs 18.7 (*)    Lymphs Abs 0.9 (*)    Monocytes Absolute 1.4 (*)    All other components within normal limits  COMPREHENSIVE METABOLIC PANEL - Abnormal; Notable for the following:    Chloride 99 (*)    Glucose, Bld 212 (*)    BUN 25 (*)    Creatinine, Ser 1.04 (*)    AST 43 (*)    GFR calc non Af Amer 52 (*)    GFR calc Af Amer 60 (*)    All other components within normal limits  CULTURE, BLOOD (ROUTINE X 2)  CULTURE, BLOOD (ROUTINE X 2)  TROPONIN I  LACTIC ACID, PLASMA  LACTIC ACID, PLASMA  BRAIN NATRIURETIC PEPTIDE   ____________________________________________  EKG  Reviewed and interpreted by me at 1:30 AM Heart rate 100 QRS 90 QTc 4:30 Interpreted as sinus tachycardia without acute ischemic abnormality. ____________________________________________  RADIOLOGY  DG Chest Port 1 View (Final result) Result time: 02/28/16 02:18:14   Final result by Rad Results In Interface (02/28/16 02:18:14)   Narrative:   CLINICAL DATA: Acute onset of shortness of breath and chills. Initial encounter.  EXAM: PORTABLE CHEST 1 VIEW  COMPARISON: Chest radiograph performed 07/12/2015  FINDINGS: The lungs are well-aerated. Vascular congestion is noted. Mild bibasilar opacities may reflect pneumonia, given the  patient's symptoms. There is no evidence of pleural effusion or pneumothorax.  The cardiomediastinal silhouette is mildly enlarged. No acute osseous abnormalities are seen.  IMPRESSION: Vascular congestion and mild cardiomegaly. Mild bibasilar opacities may reflect pneumonia, given the patient's symptoms.   Electronically Signed By: Garald Balding M.D. On: 02/28/2016 02:18       ____________________________________________   PROCEDURES  Procedure(s) performed: None  Critical Care performed: Yes, see critical care note(s)  CRITICAL CARE Performed by: Delman Kitten   Total critical care time: 40 minutes  Critical care time was exclusive of separately billable procedures and treating other patients.  Critical care was necessary to treat or prevent imminent or life-threatening deterioration.  Critical care was time spent personally by me on the following activities: development of treatment plan with patient and/or surrogate as well as nursing, discussions with consultants, evaluation of patient's response to treatment, examination of patient, obtaining history from patient or surrogate, ordering and performing treatments and interventions, ordering and review of laboratory studies, ordering and review of radiographic studies, pulse oximetry and re-evaluation of patient's condition.  ____________________________________________   INITIAL IMPRESSION / ASSESSMENT AND PLAN / ED COURSE  Pertinent labs & imaging results that were available during my care of the patient were reviewed by me and considered in my medical decision making (see chart for details).  Patient presents with dyspnea, tachypnea, poor movement and mild wheezing noted throughout. She is barrel chested and appears to be quite dyspneic. Overall, she does report starting a new medicine today but does not demonstrate any swelling, hypertension, itching, skin rash or any other symptoms suggest acute allergic  reaction. Based on the patient's history I most suspect likely suffering from acute COPD exacerbation, though certainly need to exclude pneumothorax, acute coronary syndrome. We will follow her closely, I'll initiate BiPAP therapy here in monitor her very closely. She is not hypotensive and she does have low-grade fever. Check lactic acid sent blood cultures.  ----------------------------------------- 2:37 AM on 02/28/2016 -----------------------------------------  Patient has significant leukocytosis, low-grade fever and also bibasilar opacities. Probably some vascular congestion as well. D/W Dr. Estanislado Pandy (will admit). ____________________________________________   FINAL CLINICAL IMPRESSION(S) / ED DIAGNOSES  Final diagnoses:  COPD exacerbation ([place])  Hypoxia  Pulmonary vascular congestion  Community acquired pneumonia  Sepsis, due to unspecified organism [facility])      Delman Kitten, MD 02/28/16 [phone]

## 2016-02-28 NOTE — H&P (Signed)
[facility] [place] NAME: Ragene Ingrim    MR#:  [id number]  DATE OF BIRTH:  10-16-1941  DATE OF ADMISSION:  02/28/2016  PRIMARY CARE PHYSICIAN: Dicky Doe, MD   REQUESTING/REFERRING PHYSICIAN:   CHIEF COMPLAINT:   Chief Complaint  Patient presents with  . Shortness of Breath  . Medication Reaction    Nitrofurantonin    HISTORY OF PRESENT ILLNESS: Dejanira Medico  is a 75 y.o. female with a known history of diabetes mellitus, hypertension, COPD, hyperlipidemia presented to the emergency room with difficulty breathing. Patient was given oral Macrobid for urinary tract infection by primary care physician. After she took the medication she felt short of breath. She came to the emergency room she was put on oxygen via BiPAP and stabilized. Her O2 saturation on 4 L oxygen was around 80%. She normally uses 2 L of oxygen at home. No complaints of chest pain. No complaints of orthopnea. Has occasional cough and had some chills yesterday but no fever. Patient was also given IV Solu-Medrol and inhalation treatments in the ER. No sick contacts at home or any history of recent travel.  PAST MEDICAL HISTORY:   Past Medical History  Diagnosis Date  . Diabetes mellitus without complication ([place])   . Hypertension   . COPD (chronic obstructive pulmonary disease) ([place])   . Hyperlipidemia     PAST SURGICAL HISTORY: Past Surgical History  Procedure Laterality Date  . Abdominal hysterectomy    . Spine surgery      SOCIAL HISTORY:  Social History  Substance Use Topics  . Smoking status: Former Smoker -- 0.50 packs/day    Quit date: 04/28/2015  . Smokeless tobacco: Never Used  . Alcohol Use: No    FAMILY HISTORY:  Family History  Problem Relation Age of Onset  . Hypertension Paternal Grandfather     DRUG ALLERGIES: No Known Allergies  REVIEW OF SYSTEMS:   CONSTITUTIONAL: No fever, fatigue or weakness.  EYES: No blurred or double  vision.  EARS, NOSE, AND THROAT: No tinnitus or ear pain.  RESPIRATORY: Has occasional cough, shortness of breath present, wheezing present  CARDIOVASCULAR: No chest pain, orthopnea, edema.  GASTROINTESTINAL: No nausea, vomiting, diarrhea or abdominal pain.  GENITOURINARY: No dysuria, hematuria.  ENDOCRINE: No polyuria, nocturia,  HEMATOLOGY: No anemia, easy bruising or bleeding SKIN: No rash or lesion. MUSCULOSKELETAL: No joint pain or arthritis.   NEUROLOGIC: No tingling, numbness, weakness.  PSYCHIATRY: No anxiety or depression.   MEDICATIONS AT HOME:  Prior to Admission medications   Medication Sig Start Date End Date Taking? Authorizing Provider  ADVAIR DISKUS 250-50 MCG/DOSE AEPB INHALE 1 PUFF TWICE DAILY RINSE MOUTH AFTER EACH USE. 07/19/15   Arlis Porta., MD  albuterol (VENTOLIN HFA) 108 (90 Base) MCG/ACT inhaler Inhale 2 puffs into the lungs every 6 (six) hours as needed for wheezing or shortness of breath. 01/30/16   Arlis Porta., MD  amLODipine (NORVASC) 2.5 MG tablet  05/22/15   Historical Provider, MD  amoxicillin (AMOXIL) 500 MG capsule Take 1 capsule (500 mg total) by mouth 3 (three) times daily. 02/25/16 03/02/16  Arlis Porta., MD  carvedilol (COREG) 25 MG tablet TAKE 1 TABLET BY MOUTH TWICE DAILY 11/20/15   Arlis Porta., MD  clonazePAM [place]) 0.5 MG tablet  07/04/15   Historical Provider, MD  diazepam (VALIUM) 5 MG tablet  02/19/16   Historical Provider, MD  furosemide (LASIX) 20  MG tablet Take 1 tablet (20 mg total) by mouth daily. 01/21/16   Arlis Porta., MD  gabapentin (NEURONTIN) 800 MG tablet Take 1 tablet three times a day. 02/25/16   Arlis Porta., MD  indapamide (LOZOL) 1.25 MG tablet TAKE ONE TABLET BY MOUTH EVERY DAY 11/20/15   Arlis Porta., MD  lisinopril (PRINIVIL,ZESTRIL) 40 MG tablet TAKE ONE (1) TABLET BY MOUTH EVERY DAY 09/27/15   Arlis Porta., MD  metFORMIN (GLUCOPHAGE) 500 MG tablet  05/22/15   Historical  Provider, MD  montelukast (SINGULAIR) 10 MG tablet TAKE ONE (1) TABLET BY MOUTH EVERY DAY 08/27/15   Arlis Porta., MD  nitrofurantoin, macrocrystal-monohydrate, (MACROBID) 100 MG capsule Take 1 capsule (100 mg total) by mouth 2 (two) times daily. 02/27/16   Arlis Porta., MD  Oxycodone HCl 10 MG TABS Take 1 tablet by mouth 3 (three) times daily as needed. 07/08/15   Historical Provider, MD  potassium chloride (K-DUR) 10 MEQ tablet 10 mEq. 06/19/15   Historical Provider, MD  SPIRIVA HANDIHALER 18 MCG inhalation capsule ONE CAPSULE INHALED VIA UNIT ONCE EVERY 24 HOURS 11/20/15   Arlis Porta., MD  valACYclovir (VALTREX) 1000 MG tablet  07/25/15   Historical Provider, MD      PHYSICAL EXAMINATION:   VITAL SIGNS: Blood pressure 124/60, pulse 89, temperature 100.1 F (37.8 C), temperature source Oral, resp. rate 26, SpO2 95 %.  GENERAL:  75 y.o.-year-old patient lying in the bed with no acute distress.  EYES: Pupils equal, round, reactive to light and accommodation. No scleral icterus. Extraocular muscles intact.  HEENT: Head atraumatic, normocephalic. Oropharynx and nasopharynx clear.  NECK:  Supple, no jugular venous distention. No thyroid enlargement, no tenderness.  LUNGS: Decreased breath sounds bilaterally, bilateral wheezing noted,no rales,rhonchi or crepitation. No use of accessory muscles of respiration.  CARDIOVASCULAR: S1, S2 normal. No murmurs, rubs, or gallops.  ABDOMEN: Soft, nontender, nondistended. Bowel sounds present. No organomegaly or mass.  EXTREMITIES: No pedal edema, cyanosis, or clubbing.  NEUROLOGIC: Cranial nerves II through XII are intact. Muscle strength 5/5 in all extremities. Sensation intact. No cerebellar signs noted. PSYCHIATRIC: The patient is alert and oriented x 3.  SKIN: No obvious rash, lesion, or ulcer.   LABORATORY PANEL:   CBC  Recent Labs Lab 02/28/16 0130  WBC 21.6*  HGB 12.9  HCT 39.5  PLT 171  MCV 89.6  MCH 29.4  MCHC 32.8   RDW 13.2  LYMPHSABS 0.9*  MONOABS 1.4*  EOSABS 0.5  BASOSABS 0.1   ------------------------------------------------------------------------------------------------------------------  Chemistries   Recent Labs Lab 02/28/16 0130  NA 137  K 3.9  CL 99*  CO2 31  GLUCOSE 212*  BUN 25*  CREATININE 1.04*  CALCIUM 9.2  AST 43*  ALT 21  ALKPHOS 67  BILITOT 0.7   ------------------------------------------------------------------------------------------------------------------ estimated creatinine clearance is 62.8 mL/min (by C-G formula based on Cr of 1.04). ------------------------------------------------------------------------------------------------------------------ No results for input(s): TSH, T4TOTAL, T3FREE, THYROIDAB in the last 72 hours.  Invalid input(s): FREET3   Coagulation profile No results for input(s): INR, PROTIME in the last 168 hours. ------------------------------------------------------------------------------------------------------------------- No results for input(s): DDIMER in the last 72 hours. -------------------------------------------------------------------------------------------------------------------  Cardiac Enzymes  Recent Labs Lab 02/28/16 0130  TROPONINI <0.03   ------------------------------------------------------------------------------------------------------------------ Invalid input(s): POCBNP  ---------------------------------------------------------------------------------------------------------------  Urinalysis    Component Value Date/Time   COLORURINE Yellow 09/19/2014 1157   APPEARANCEUR Cloudy 09/19/2014 1157   LABSPEC 1.017 09/19/2014 1157   PHURINE 7.0  09/19/2014 1157   GLUCOSEU Negative 09/19/2014 1157   HGBUR Negative 09/19/2014 1157   BILIRUBINUR neg 02/25/2016 1035   BILIRUBINUR Negative 09/19/2014 1157   KETONESUR Negative 09/19/2014 1157   PROTEINUR trace 02/25/2016 1035   PROTEINUR Negative  09/19/2014 1157   UROBILINOGEN 0.2 02/25/2016 1035   NITRITE pos 02/25/2016 1035   NITRITE Positive 09/19/2014 1157   LEUKOCYTESUR moderate (2+)* 02/25/2016 1035   LEUKOCYTESUR Negative 09/19/2014 1157     RADIOLOGY: Dg Chest Port 1 View  02/28/2016  CLINICAL DATA:  Acute onset of shortness of breath and chills. Initial encounter. EXAM: PORTABLE CHEST 1 VIEW COMPARISON:  Chest radiograph performed 07/12/2015 FINDINGS: The lungs are well-aerated. Vascular congestion is noted. Mild bibasilar opacities may reflect pneumonia, given the patient's symptoms. There is no evidence of pleural effusion or pneumothorax. The cardiomediastinal silhouette is mildly enlarged. No acute osseous abnormalities are seen. IMPRESSION: Vascular congestion and mild cardiomegaly. Mild bibasilar opacities may reflect pneumonia, given the patient's symptoms. Electronically Signed   By: Garald Balding M.D.   On: 02/28/2016 02:18    EKG: Orders placed or performed during the hospital encounter of 02/28/16  . EKG 12-Lead  . EKG 12-Lead    IMPRESSION AND PLAN: 75 year old female patient with history of COPD on home oxygen, diabetes mellitus, hypertension presented to the emergency room with difficulty breathing and low oxygen saturation. Admitting diagnosis 1. Acute COPD exacerbation 2. Hypoxia 3. Community-acquired pneumonia 4. Acute respiratory distress 5.Hypertension Treatment plan Admit patient to stepdown unit Nebulization treatment Solu-Medrol IV along with IV antibiotics continue BiPAP Rocephin and IV Zithromax antibiotics Supportive care.  All the records are reviewed and case discussed with ED provider. Management plans discussed with the patient, family and they are in agreement.  CODE STATUS:FULL Code Status History    This patient does not have a recorded code status. Please follow your organizational policy for patients in this situation.       TOTAL TIME TAKING CARE OF THIS PATIENT: 55  minutes.    Saundra Shelling M.D on 02/28/2016 at 3:51 AM   Between 7am to 6pm - Pager - [phone]  After 6pm go to [email or web address] - password [email or web address] [facility]  Office  [phone]  CC: Primary care physician; Dicky Doe, MD

## 2016-02-28 NOTE — ED Notes (Signed)
Lab called and notified of add on BNP. States they will run it.

## 2016-02-28 NOTE — ED Notes (Addendum)
Per EMS, patient comes from home. She was seen yesterday at her PCP and they dx her with a UTI, they prescribed her nitrofurantoin. Patient took 100mg  of said medication around 1800.  Patient states she began feeling SOB and chills around 2300. Hx of COPD, Patient always wears 2L O2. EMS placed patient on 4L. Patient stating 77% on 4L upon arrival. Respiratory called, they state to put her on 6L. Patient is A&O x4.

## 2016-02-29 LAB — GLUCOSE, CAPILLARY
GLUCOSE-CAPILLARY: 201 mg/dL — AB (ref 65–99)
GLUCOSE-CAPILLARY: 169 mg/dL — AB (ref 65–99)
GLUCOSE-CAPILLARY: 194 mg/dL — AB (ref 65–99)

## 2016-02-29 MED ORDER — SODIUM CHLORIDE 0.9 % IV SOLN
1.0000 g | INTRAVENOUS | Status: DC
  Administered 2016-02-29: 1 g via INTRAVENOUS
  Filled 2016-02-29 (×3): qty 1

## 2016-02-29 NOTE — Progress Notes (Signed)
Pt is in no distress. Pt has been weaned off BIPAP for several days. BIPAP is on standby should she need to use it.

## 2016-02-29 NOTE — Progress Notes (Signed)
Patient ID: Megan Bolton, female   DOB: [birth year], 75 y.o.   MRN: [id number] [facility] PROGRESS NOTE  Megan Bolton [id number] DOB: [birth year] DOA: 02/28/2016 PCP: Dicky Doe, MD  HPI/Subjective: Patient breathing better than when she came in. Has a little bit of a cough. Had a shaking chill prior to coming in.  Objective: Filed Vitals:   02/29/16 1300 02/29/16 1346  BP: 140/72 140/68  Pulse: 82   Temp: 97.7 F (36.5 C)   Resp: 19     Filed Weights   02/28/16 0540  Weight: 110 kg (242 lb 8.1 oz)    ROS: Review of Systems  Constitutional: Negative for fever and chills.  Eyes: Negative for blurred vision.  Respiratory: Positive for cough and shortness of breath.   Cardiovascular: Negative for chest pain.  Gastrointestinal: Negative for nausea, vomiting, abdominal pain, diarrhea and constipation.  Genitourinary: Negative for dysuria.  Musculoskeletal: Negative for joint pain.  Neurological: Negative for dizziness and headaches.   Exam: Physical Exam  Constitutional: She is oriented to person, place, and time.  HENT:  Nose: No mucosal edema.  Mouth/Throat: No oropharyngeal exudate or posterior oropharyngeal edema.  Eyes: Conjunctivae, EOM and lids are normal. Pupils are equal, round, and reactive to light.  Neck: No JVD present. Carotid bruit is not present. No edema present. No thyroid mass and no thyromegaly present.  Cardiovascular: S1 normal and S2 normal.  Exam reveals no gallop.   No murmur heard. Pulses:      Dorsalis pedis pulses are 2+ on the right side, and 2+ on the left side.  Respiratory: No respiratory distress. She has decreased breath sounds in the right lower field and the left lower field. She has no wheezes. She has no rhonchi. She has no rales.  GI: Soft. Bowel sounds are normal. There is no tenderness.  Musculoskeletal:       Right ankle: She exhibits swelling.       Left ankle: She exhibits swelling.  Lymphadenopathy:     She has no cervical adenopathy.  Neurological: She is alert and oriented to person, place, and time. No cranial nerve deficit.  Skin: Skin is warm. No rash noted. Nails show no clubbing.  Psychiatric: She has a normal mood and affect.      Data Reviewed: Basic Metabolic Panel:  Recent Labs Lab 02/28/16 0130 02/28/16 0759  NA 137 136  K 3.9 4.7  CL 99* 99*  CO2 31 30  GLUCOSE 212* 326*  BUN 25* 30*  CREATININE 1.04* 1.12*  CALCIUM 9.2 8.9   Liver Function Tests:  Recent Labs Lab 02/28/16 0130  AST 43*  ALT 21  ALKPHOS 67  BILITOT 0.7  PROT 7.1  ALBUMIN 3.8   CBC:  Recent Labs Lab 02/28/16 0130 02/28/16 0759  WBC 21.6* 24.5*  NEUTROABS 18.7*  --   HGB 12.9 12.4  HCT 39.5 38.3  MCV 89.6 88.8  PLT 171 169   Cardiac Enzymes:  Recent Labs Lab 02/28/16 0130  TROPONINI <0.03   BNP (last 3 results)  Recent Labs  02/28/16 0130  BNP 172.0*     CBG:  Recent Labs Lab 02/28/16 0738 02/28/16 1221 02/28/16 1613 02/28/16 2153 02/29/16 1200  GLUCAP 296* 314* 277* 230* 201*    Recent Results (from the past 240 hour(s))  Urine Culture     Status: Abnormal   Collection Time: 02/25/16 12:00 AM  Result Value Ref Range Status   Urine Culture, Routine Final report (  A)  Final   Urine Culture result 1 Escherichia coli (A)  Final    Comment: Greater than 100,000 colony forming units per mL   ANTIMICROBIAL SUSCEPTIBILITY Comment  Final    Comment:       ** S = Susceptible; I = Intermediate; R = Resistant **                    P = Positive; N = Negative             MICS are expressed in micrograms per mL    Antibiotic                 RSLT#1    RSLT#2    RSLT#3    RSLT#4 Amoxicillin/Clavulanic Acid    I Ampicillin                     R Cefazolin                      R Cefepime                       R Ceftriaxone                    R Cefuroxime                     R Cephalothin                    R Ciprofloxacin                  R Ertapenem                       S Gentamicin                     S Imipenem                       S Levofloxacin                   R Nitrofurantoin                 S Piperacillin                   R Tetracycline                   R Tobramycin                     S Trimethoprim/Sulfa             R   Culture, blood (Routine X 2) w Reflex to ID Panel     Status: None (Preliminary result)   Collection Time: 02/28/16  1:37 AM  Result Value Ref Range Status   Specimen Description BLOOD LEFT ARM  Final   Special Requests BOTTLES DRAWN AEROBIC AND ANAEROBIC 5ML  Final   Culture NO GROWTH 1 DAY  Final   Report Status PENDING  Incomplete  Culture, blood (Routine X 2) w Reflex to ID Panel     Status: None (Preliminary result)   Collection Time: 02/28/16  1:37 AM  Result Value Ref Range Status   Specimen Description BLOOD RIGHT HAND  Final   Special Requests BOTTLES DRAWN AEROBIC AND ANAEROBIC [place]  Final  Culture NO GROWTH 1 DAY  Final   Report Status PENDING  Incomplete  MRSA PCR Screening     Status: None   Collection Time: 02/28/16  5:37 AM  Result Value Ref Range Status   MRSA by PCR NEGATIVE NEGATIVE Final    Comment:        The GeneXpert MRSA Assay (FDA approved for NASAL specimens only), is one component of a comprehensive MRSA colonization surveillance program. It is not intended to diagnose MRSA infection nor to guide or monitor treatment for MRSA infections.      Studies: Dg Chest Port 1 View  02/28/2016  CLINICAL DATA:  Acute onset of shortness of breath and chills. Initial encounter. EXAM: PORTABLE CHEST 1 VIEW COMPARISON:  Chest radiograph performed 07/12/2015 FINDINGS: The lungs are well-aerated. Vascular congestion is noted. Mild bibasilar opacities may reflect pneumonia, given the patient's symptoms. There is no evidence of pleural effusion or pneumothorax. The cardiomediastinal silhouette is mildly enlarged. No acute osseous abnormalities are seen. IMPRESSION: Vascular congestion and  mild cardiomegaly. Mild bibasilar opacities may reflect pneumonia, given the patient's symptoms. Electronically Signed   By: Garald Balding M.D.   On: 02/28/2016 02:18    Scheduled Meds: . antiseptic oral rinse  7 mL Mouth Rinse q12n4p  . azithromycin  500 mg Intravenous Q24H  . carvedilol  25 mg Oral BID WC  . cefTRIAXone (ROCEPHIN)  IV  1 g Intravenous Q24H  . chlorhexidine  15 mL Mouth Rinse BID  . enoxaparin (LOVENOX) injection  40 mg Subcutaneous Q0600  . furosemide  20 mg Oral Daily  . gabapentin  800 mg Oral TID  . indapamide  1.25 mg Oral Daily  . insulin aspart  0-20 Units Subcutaneous TID WC  . insulin aspart  0-5 Units Subcutaneous QHS  . insulin aspart  3 Units Subcutaneous TID WC  . insulin glargine  11 Units Subcutaneous QHS  . ipratropium  0.5 mg Nebulization Q6H  . lisinopril  40 mg Oral Daily  . metFORMIN  500 mg Oral Q breakfast  . methylPREDNISolone (SOLU-MEDROL) injection  60 mg Intravenous Q12H  . montelukast  10 mg Oral QHS  . oxyCODONE  5 mg Oral 3 times per day  . potassium chloride  10 mEq Oral Daily  . sodium chloride flush  3 mL Intravenous Q12H    Assessment/Plan:  1. Acute on chronic respiratory failure. Continue oxygen supplementation. Patient off BiPAP at this point. 2. Pneumonia bilaterally. Patient was placed on Rocephin and Zithromax which would cover. 3. Multi-drug-resistant Escherichia coli urinary tract infection recently. Was switched over to [facility] once results were done. While here in the hospital I will give IV Invanz and likely will have to go back on nitrofurantoin as outpatient. 4. Type 2 diabetes- while on steroids sugars will be high. Patient was started on insulin. 5. Neuropathy on gabapentin 6. Essential hypertension on lisinopril  Code Status:     Code Status Orders        Start     Ordered   02/28/16 0541  Full code   Continuous     02/28/16 0540    Code Status History    Date Active Date Inactive Code Status Order ID  Comments User Context   This patient has a current code status but no historical code status.     Family Communication: Spoke with family at the bedside Disposition Plan: Potentially home tomorrow  Antibiotics:  Switch antibiotics to ertapenem secondary to multidrug resistant Escherichia coli  in the urine. Continue Zithromax for right now.  Time spent: 25 minutes  Loletha Grayer  [facility]

## 2016-02-29 NOTE — Progress Notes (Signed)
Physical Therapy Evaluation Patient Details Name: Megan Bolton MRN: [id number] DOB: September 01, 1941 Today's Date: 02/29/2016   History of Present Illness  Megan Bolton is a 75 y.o. female with a known history of diabetes mellitus, hypertension, COPD, hyperlipidemia presented to the emergency room with difficulty breathing.  Clinical Impression  Pt currently is modified independent with bed mobility and supervision for ambulation 200' with standing rest break x2 for low back pain/fatigue.  Pt amb on 2L O2 via [place] with O2 sats above 90% throughout session.  Pt demonstrates good safety with all mobility.  Recommend pt to walk 2-3 times per day with nursing.  No further skilled PT needs at this time.    Follow Up Recommendations No PT follow up    Equipment Recommendations  None recommended by PT    Recommendations for Other Services       Precautions / Restrictions Precautions Precautions: None Restrictions Weight Bearing Restrictions: No      Mobility  Bed Mobility Overal bed mobility: Modified Independent             General bed mobility comments: Extra time to get out of bed, HOB elevated  Transfers Overall transfer level: Modified independent Equipment used: Rolling walker (2 wheeled)             General transfer comment: Good body mechanics and safety awareness with sit<>stand transfers.  Ambulation/Gait Ambulation/Gait assistance: Supervision Ambulation Distance (Feet): 200 Feet Assistive device: Rolling walker (2 wheeled) Gait Pattern/deviations: Step-through pattern Gait velocity: decreased   General Gait Details: Decreased cadence and standing rest break x2 due to low back fatigue; 3 L O2 via [place] donned, O2 sats at 92% with during gait.  Stairs            Wheelchair Mobility    Modified Rankin (Stroke Patients Only)       Balance Overall balance assessment: Modified Independent                                            Pertinent Vitals/Pain Pain Assessment: No/denies pain    Home Living Family/patient expects to be discharged to:: Private residence Living Arrangements: Children Available Help at Discharge: Family Type of Home: House Home Access: Stairs to enter Entrance Stairs-Rails: Right Entrance Stairs-Number of Steps: 2 Home Layout: One level Home Equipment: Grab bars - toilet;Walker - 4 wheels      Prior Function Level of Independence: Independent with assistive device(s)         Comments: Household ambulation; gets out to church if feeling up to it that day.     Hand Dominance        Extremity/Trunk Assessment   Upper Extremity Assessment: Overall WFL for tasks assessed           Lower Extremity Assessment: Overall WFL for tasks assessed      Cervical / Trunk Assessment: Lordotic  Communication   Communication: No difficulties  Cognition Arousal/Alertness: Awake/alert Behavior During Therapy: WFL for tasks assessed/performed Overall Cognitive Status: Within Functional Limits for tasks assessed                      General Comments      Exercises        Assessment/Plan    PT Assessment Patent does not need any further PT services  PT Diagnosis Difficulty walking   PT Problem  List    PT Treatment Interventions     PT Goals (Current goals can be found in the Care Plan section) Acute Rehab PT Goals Patient Stated Goal: To go home PT Goal Formulation: All assessment and education complete, DC therapy    Frequency     Barriers to discharge        Co-evaluation               End of Session Equipment Utilized During Treatment: Gait belt Activity Tolerance: Patient tolerated treatment well Patient left: in bed;with family/visitor present Nurse Communication: Mobility status         Time: [id number] PT Time Calculation (min) (ACUTE ONLY): 20 min   Charges:   PT Evaluation $PT Eval Low Complexity: 1 Procedure     PT G  Codes:        Jakye Mullens A Kandi Brusseau 03/02/16, 3:31 PM

## 2016-03-01 LAB — GLUCOSE, CAPILLARY
Glucose-Capillary: 188 mg/dL — ABNORMAL HIGH (ref 65–99)
Glucose-Capillary: 237 mg/dL — ABNORMAL HIGH (ref 65–99)

## 2016-03-01 MED ORDER — LEVOFLOXACIN 500 MG PO TABS: 500.0000 mg | ORAL_TABLET | Freq: Every day | ORAL | Status: DC

## 2016-03-01 MED ORDER — ALBUTEROL SULFATE (2.5 MG/3ML) 0.083% IN NEBU: 2.5000 mg | INHALATION_SOLUTION | Freq: Four times a day (QID) | RESPIRATORY_TRACT | Status: DC | PRN

## 2016-03-01 MED ORDER — PREDNISOLONE 5 MG PO TABS: 20.0000 mg | ORAL_TABLET | Freq: Once | ORAL | Status: DC

## 2016-03-01 MED ORDER — SODIUM CHLORIDE 0.9 % IV SOLN
1.0000 g | Freq: Once | INTRAVENOUS | Status: AC
  Administered 2016-03-01: 1 g via INTRAVENOUS
  Filled 2016-03-01: qty 1

## 2016-03-01 MED ORDER — PREDNISONE 20 MG PO TABS
20.0000 mg | ORAL_TABLET | Freq: Once | ORAL | Status: AC
Start: 2016-03-01 — End: 2016-03-01
  Administered 2016-03-01: 20 mg via ORAL
  Filled 2016-03-01: qty 1

## 2016-03-01 MED ORDER — PREDNISONE 5 MG PO TABS: ORAL_TABLET | ORAL | Status: DC

## 2016-03-01 NOTE — Clinical Documentation Improvement (Signed)
Internal Medicine Please update your documentation within the medical record to reflect your response to this query. Thank you  Can the diagnosis of [name] be further specified?   [name] Stage I - GFR greater than or equal to 90  [name] Stage II - GFR 60-89  [name] Stage III - GFR 30-59  [name] Stage IV - GFR 15-29  [name] Stage V - GFR < 15  ESRD (End Stage Renal Disease)  Other condition  Unable to clinically determine  Supporting Information: : (risk factors, signs and symptoms, diagnostics, treatment) 02/28/16 Per ED MD note..."[name] (chronic kidney disease).".Marland KitchenMarland Kitchen Results for A[facility]ORA, RODY (MRN [id number]) as of 03/01/2016 11:39  02/28/2016 01:30 02/28/2016 07:59  BUN 25 (H) 30 (H)  Creatinine 1.04 (H) 1.12 (H)  EGFR (Non-African Amer.) 52 (L) 47 (L)   Please exercise your independent, professional judgment when responding. A specific answer is not anticipated or expected.  Thank You, Ermelinda Das, RN, BSN, [place] Certified Clinical Documentation Specialist [place]: Health Information Management [phone]

## 2016-03-01 NOTE — Discharge Instructions (Addendum)
Take medications as prescribed.  It is important to take your antibiotic every day as prescribed until finished, even if you are feeling better.   Be sure to drink plenty of liquids.  Please contact your doctor's office if you should experience nausea, vomiting, fever, increased cough or bloody phlegm.  Also contact your doctors office if you should have any questions or concerns.  Community-Acquired Pneumonia, Adult Pneumonia is an infection of the lungs. There are different types of pneumonia. One type can develop while a person is in a hospital. A different type, called community-acquired pneumonia, develops in people who are not, or have not recently been, in the hospital or other health care facility.  CAUSES Pneumonia may be caused by bacteria, viruses, or funguses. Community-acquired pneumonia is often caused by Streptococcus pneumonia bacteria. These bacteria are often passed from one person to another by breathing in droplets from the cough or sneeze of an infected person. RISK FACTORS The condition is more likely to develop in:  People who havechronic diseases, such as chronic obstructive pulmonary disease (COPD), asthma, congestive heart failure, cystic fibrosis, diabetes, or kidney disease.  People who haveearly-stage or late-stage HIV.  People who havesickle cell disease.  People who havehad their spleen removed (splenectomy).  People who havepoor Human resources officer.  People who havemedical conditions that increase the risk of breathing in (aspirating) secretions their own mouth and nose.   People who havea weakened immune system (immunocompromised).  People who smoke.  People whotravel to areas where pneumonia-causing germs commonly exist.  People whoare around animal habitats or animals that have pneumonia-causing germs, including birds, bats, rabbits, cats, and farm animals. SYMPTOMS Symptoms of this condition include:  Adry cough.  A wet (productive)  cough.  Fever.  Sweating.  Chest pain, especially when breathing deeply or coughing.  Rapid breathing or difficulty breathing.  Shortness of breath.  Shaking chills.  Fatigue.  Muscle aches. DIAGNOSIS Your health care provider will take a medical history and perform a physical exam. You may also have other tests, including:  Imaging studies of your chest, including X-rays.  Tests to check your blood oxygen level and other blood gases.  Other tests on blood, mucus (sputum), fluid around your lungs (pleural fluid), and urine. If your pneumonia is severe, other tests may be done to identify the specific cause of your illness. TREATMENT The type of treatment that you receive depends on many factors, such as the cause of your pneumonia, the medicines you take, and other medical conditions that you have. For most adults, treatment and recovery from pneumonia may occur at home. In some cases, treatment must happen in a hospital. Treatment may include:  Antibiotic medicines, if the pneumonia was caused by bacteria.  Antiviral medicines, if the pneumonia was caused by a virus.  Medicines that are given by mouth or through an IV tube.  Oxygen.  Respiratory therapy. Although rare, treating severe pneumonia may include:  Mechanical ventilation. This is done if you are not breathing well on your own and you cannot maintain a safe blood oxygen level.  Thoracentesis. This procedureremoves fluid around one lung or both lungs to help you breathe better. HOME CARE INSTRUCTIONS  Take over-the-counter and prescription medicines only as told by your health care provider.  Only takecough medicine if you are losing sleep. Understand that cough medicine can prevent your body's natural ability to remove mucus from your lungs.  If you were prescribed an antibiotic medicine, take it as told by your health care  provider. Do not stop taking the antibiotic even if you start to feel  better.  Sleep in a semi-upright position at night. Try sleeping in a reclining chair, or place a few pillows under your head.  Do not use tobacco products, including cigarettes, chewing tobacco, and e-cigarettes. If you need help quitting, ask your health care provider.  Drink enough water to keep your urine clear or pale yellow. This will help to thin out mucus secretions in your lungs. PREVENTION There are ways that you can decrease your risk of developing community-acquired pneumonia. Consider getting a pneumococcal vaccine if:  You are older than 75 years of age.  You are older than 75 years of age and are undergoing cancer treatment, have chronic lung disease, or have other medical conditions that affect your immune system. Ask your health care provider if this applies to you. There are different types and schedules of pneumococcal vaccines. Ask your health care provider which vaccination option is best for you. You may also prevent community-acquired pneumonia if you take these actions:  Get an influenza vaccine every year. Ask your health care provider which type of influenza vaccine is best for you.  Go to the dentist on a regular basis.  Wash your hands often. Use hand sanitizer if soap and water are not available. SEEK MEDICAL CARE IF:  You have a fever.  You are losing sleep because you cannot control your cough with cough medicine. SEEK IMMEDIATE MEDICAL CARE IF:  You have worsening shortness of breath.  You have increased chest pain.  Your sickness becomes worse, especially if you are an older adult or have a weakened immune system.  You cough up blood.   This information is not intended to replace advice given to you by your health care provider. Make sure you discuss any questions you have with your health care provider.   Document Released: 12/14/2005 Document Revised: 09/04/2015 Document Reviewed: 04/10/2015 Elsevier Interactive Patient Education [employer]

## 2016-03-01 NOTE — Progress Notes (Signed)
Patient discharge teaching given, including activity, diet, follow-up appoints, and medications. Patient verbalized understanding of all discharge instructions. IV access was d/c'd. Vitals are stable. Skin is intact except as charted in most recent assessments. Pt to be escorted out by NT, to be driven home by family.  Megan Bolton E Hobbs  

## 2016-03-01 NOTE — Clinical Documentation Improvement (Signed)
Internal Medicine Please update your documentation within the medical record to reflect your response to this query. Thank you  Can the diagnosis of CHF be further specified?    Acuity - Acute, Chronic, Acute on Chronic   Type - Systolic, Diastolic, Systolic and Diastolic  Other  Clinically Undetermined  Document any associated diagnoses/conditions  Supporting Information: 02/28/16 progr note.Marland KitchenMarland Kitchen"#3 history of CHF-clinically patient is on congestive heart failure. -Continue Coreg, Lasix, lisinopril..."  Please exercise your independent, professional judgment when responding. A specific answer is not anticipated or expected.  Thank You,  Ermelinda Das, RN, BSN, [place] Certified Clinical Documentation Specialist [place]: Health Information Management [phone]

## 2016-03-01 NOTE — Discharge Summary (Signed)
[place] [facility] [place] NAME: Megan Bolton    MR#:  [id number]  DATE OF BIRTH:  Jun 08, 1941  DATE OF ADMISSION:  02/28/2016 ADMITTING PHYSICIAN: Saundra Shelling, MD  DATE OF DISCHARGE: 03/01/2016  2:38 PM  PRIMARY CARE PHYSICIAN: Dicky Doe, MD    ADMISSION DIAGNOSIS:  Community acquired pneumonia [J18.9] Hypoxia [R09.02] COPD exacerbation ([place]) [J44.1] Pulmonary vascular congestion [R09.89] Sepsis, due to unspecified organism ([place]) [A41.9]  DISCHARGE DIAGNOSIS:  Active Problems:   Community acquired pneumonia   Pneumonia   SECONDARY DIAGNOSIS:   Past Medical History  Diagnosis Date  . Diabetes mellitus without complication ([place])   . Hypertension   . COPD (chronic obstructive pulmonary disease) ([place])   . Hyperlipidemia     HOSPITAL COURSE:   1. Acute on chronic respiratory failure. COPD exacerbation. Continue oxygen supplementation. Patient initially required BiPAP on presentation. I wrote her prescription for her nebulizer treatments and a quick prednisone taper since the lungs are clear today upon discharge. 2. Pneumonia bilaterally. Patient was initially placed on Rocephin and Zithromax. I switched to Levaquin upon discharge home. 3. Multidrug resistant Escherichia coli. As outpatient the patient had a urinary tract infection and she was switched over to nitrofurantoin. I gave a few doses of IV Invanz while here instead of the Rocephin. Can go back on the Macrodantin to finish up the course for this urinary tract infection. 4. Type 2 diabetes. Sugars will be high on steroids quick prednisone taper given. Patient was started on insulin here but can go back on the Glucophage as outpatient. 5. Essential hypertension on lisinopril 6. Neuropathy on gabapentin  DISCHARGE CONDITIONS:   Satisfactory  CONSULTS OBTAINED:  None  DRUG ALLERGIES:  No Known Allergies  DISCHARGE MEDICATIONS:   Advair Diskus 250/50 one  puff twice a day Albuterol nebulizer 3 mL every 6 hours as needed for wheezing Albuterol HFA 2 puffs every 6 hours as needed for wheezing Coreg 25 mg twice a day Clonazepam 0.5 mg as needed for anxiety Lasix 20 mg daily Gabapentin 800 mg 3 times a day Indapamide 1.25 mg daily Levaquin 500 mg daily until completion Lisinopril 40 mg daily Glucophage 500 mg daily with breakfast Singulair 10 mg daily at bedtime. Nitrofurantoin tone 100 mg twice a day Oxycodone 10 mg 3 times a day as needed Potassium chloride 10 mEq daily Prednisone taper as written on prescription until completion Spiriva HandiHaler 1 inhalation daily   DISCHARGE INSTRUCTIONS:   Follow-up medical doctor one week.  If you experience worsening of your admission symptoms, develop shortness of breath, life threatening emergency, suicidal or homicidal thoughts you must seek medical attention immediately by calling 911 or calling your MD immediately  if symptoms less severe.  You Must read complete instructions/literature along with all the possible adverse reactions/side effects for all the Medicines you take and that have been prescribed to you. Take any new Medicines after you have completely understood and accept all the possible adverse reactions/side effects.   Please note  You were cared for by a hospitalist during your hospital stay. If you have any questions about your discharge medications or the care you received while you were in the hospital after you are discharged, you can call the unit and asked to speak with the hospitalist on call if the hospitalist that took care of you is not available. Once you are discharged, your primary care physician will handle any further medical issues. Please note that NO REFILLS  for any discharge medications will be authorized once you are discharged, as it is imperative that you return to your primary care physician (or establish a relationship with a primary care physician if you do  not have one) for your aftercare needs so that they can reassess your need for medications and monitor your lab values.    Today   CHIEF COMPLAINT:   Chief Complaint  Patient presents with  . Shortness of Breath  . Medication Reaction    Nitrofurantonin    HISTORY OF PRESENT ILLNESS:  Megan Bolton  is a 75 y.o. female with a known history of presented with shortness of breath. This is not a medication reaction with the nitrofurantoin. She was found to have pneumonia.   VITAL SIGNS:  Blood pressure 116/64, pulse 75, temperature 98.4 F (36.9 C), temperature source Oral, resp. rate 20, height 5\' 7"  (1.702 m), weight 110 kg (242 lb 8.1 oz), SpO2 94 %.    PHYSICAL EXAMINATION:  GENERAL:  75 y.o.-year-old patient lying in the bed with no acute distress.  EYES: Pupils equal, round, reactive to light and accommodation. No scleral icterus. Extraocular muscles intact.  HEENT: Head atraumatic, normocephalic. Oropharynx and nasopharynx clear.  NECK:  Supple, no jugular venous distention. No thyroid enlargement, no tenderness.  LUNGS: Normal breath sounds bilaterally, no wheezing, rales,rhonchi or crepitation. No use of accessory muscles of respiration.  CARDIOVASCULAR: S1, S2 normal. No murmurs, rubs, or gallops.  ABDOMEN: Soft, non-tender, non-distended. Bowel sounds present. No organomegaly or mass.  EXTREMITIES: No pedal edema, cyanosis, or clubbing.  NEUROLOGIC: Cranial nerves II through XII are intact. Muscle strength 5/5 in all extremities. Sensation intact. Gait not checked.  PSYCHIATRIC: The patient is alert and oriented x 3.  SKIN: No obvious rash, lesion, or ulcer.   DATA REVIEW:   CBC  Recent Labs Lab 02/28/16 0759  WBC 24.5*  HGB 12.4  HCT 38.3  PLT 169    Chemistries   Recent Labs Lab 02/28/16 0130 02/28/16 0759  NA 137 136  K 3.9 4.7  CL 99* 99*  CO2 31 30  GLUCOSE 212* 326*  BUN 25* 30*  CREATININE 1.04* 1.12*  CALCIUM 9.2 8.9  AST 43*  --   ALT  21  --   ALKPHOS 67  --   BILITOT 0.7  --     Cardiac Enzymes  Recent Labs Lab 02/28/16 0130  TROPONINI <0.03    Microbiology Results  Results for orders placed or performed during the hospital encounter of 02/28/16  Culture, blood (Routine X 2) w Reflex to ID Panel     Status: None (Preliminary result)   Collection Time: 02/28/16  1:37 AM  Result Value Ref Range Status   Specimen Description BLOOD LEFT ARM  Final   Special Requests BOTTLES DRAWN AEROBIC AND ANAEROBIC 5ML  Final   Culture NO GROWTH 2 DAYS  Final   Report Status PENDING  Incomplete  Culture, blood (Routine X 2) w Reflex to ID Panel     Status: None (Preliminary result)   Collection Time: 02/28/16  1:37 AM  Result Value Ref Range Status   Specimen Description BLOOD RIGHT HAND  Final   Special Requests BOTTLES DRAWN AEROBIC AND ANAEROBIC [place]  Final   Culture NO GROWTH 2 DAYS  Final   Report Status PENDING  Incomplete  MRSA PCR Screening     Status: None   Collection Time: 02/28/16  5:37 AM  Result Value Ref Range Status   MRSA  by PCR NEGATIVE NEGATIVE Final    Comment:        The GeneXpert MRSA Assay (FDA approved for NASAL specimens only), is one component of a comprehensive MRSA colonization surveillance program. It is not intended to diagnose MRSA infection nor to guide or monitor treatment for MRSA infections.     Management plans discussed with the patient, family and they are in agreement.  CODE STATUS:     Code Status Orders        Start     Ordered   02/28/16 0541  Full code   Continuous     02/28/16 0540    Code Status History    Date Active Date Inactive Code Status Order ID Comments User Context   This patient has a current code status but no historical code status.      TOTAL TIME TAKING CARE OF THIS PATIENT: 35 minutes.    Loletha Grayer M.D on 03/01/2016 at 4:07 PM  Between 7am to 6pm - Pager - [phone]  After 6pm go to [email or web address] - password [email or web address] [facility]  Office  [phone]  CC: Primary care physician; Dicky Doe, MD

## 2016-03-04 LAB — CULTURE, BLOOD (ROUTINE X 2)
CULTURE: NO GROWTH
CULTURE: NO GROWTH

## 2016-03-09 ENCOUNTER — Encounter: Payer: Self-pay | Admitting: Family Medicine

## 2016-03-09 ENCOUNTER — Other Ambulatory Visit: Payer: Self-pay

## 2016-03-09 ENCOUNTER — Ambulatory Visit (INDEPENDENT_AMBULATORY_CARE_PROVIDER_SITE_OTHER): Payer: Commercial Managed Care - HMO | Admitting: Family Medicine

## 2016-03-09 VITALS — BP 131/75 | HR 74 | Temp 98.4°F | Resp 16 | Ht 67.0 in | Wt 246.2 lb

## 2016-03-09 DIAGNOSIS — J189 Pneumonia, unspecified organism: Secondary | ICD-10-CM | POA: Diagnosis not present

## 2016-03-09 DIAGNOSIS — J441 Chronic obstructive pulmonary disease with (acute) exacerbation: Secondary | ICD-10-CM

## 2016-03-09 DIAGNOSIS — J449 Chronic obstructive pulmonary disease, unspecified: Secondary | ICD-10-CM | POA: Diagnosis not present

## 2016-03-09 DIAGNOSIS — J438 Other emphysema: Secondary | ICD-10-CM | POA: Diagnosis not present

## 2016-03-09 DIAGNOSIS — I10 Essential (primary) hypertension: Secondary | ICD-10-CM | POA: Diagnosis not present

## 2016-03-09 DIAGNOSIS — E1121 Type 2 diabetes mellitus with diabetic nephropathy: Secondary | ICD-10-CM

## 2016-03-09 NOTE — Patient Instructions (Signed)
Get Urinew culture in 2 weeks.

## 2016-03-09 NOTE — Progress Notes (Signed)
Name: Megan Bolton   MRN: [id number]    DOB: 02-13-1941   Date:03/09/2016       Progress Note  Subjective  Chief Complaint  Chief Complaint  Patient presents with  . Pneumonia    hosp fu    HPI Here for f/u of Pneumonia.  Hosp from 3/3-3/5.  Finished Levaquin and Prednisone.  Also with UTI treated with Macrobid and finished yesterday.  Her breathing  Is doing well.  Still weak.   No problem-specific assessment & plan notes found for this encounter.   Past Medical History  Diagnosis Date  . Diabetes mellitus without complication ([place])   . Hypertension   . COPD (chronic obstructive pulmonary disease) ([place])   . Hyperlipidemia     Past Surgical History  Procedure Laterality Date  . Abdominal hysterectomy    . Spine surgery      Family History  Problem Relation Age of Onset  . Hypertension Paternal Grandfather     Social History   Social History  . Marital Status: Widowed    Spouse Name: N/A  . Number of Children: N/A  . Years of Education: N/A   Occupational History  . retired    Social History Main Topics  . Smoking status: Former Smoker -- 0.50 packs/day    Quit date: 04/28/2015  . Smokeless tobacco: Never Used  . Alcohol Use: No  . Drug Use: No  . Sexual Activity: Not on file   Other Topics Concern  . Not on file   Social History Narrative      Not on File   Review of Systems  Constitutional: Positive for malaise/fatigue. Negative for fever, chills and weight loss.  HENT: Negative for hearing loss.   Eyes: Negative for blurred vision and double vision.  Respiratory: Negative for cough, shortness of breath and wheezing.   Cardiovascular: Negative for chest pain, palpitations and leg swelling.  Gastrointestinal: Negative for heartburn, abdominal pain and blood in stool.  Genitourinary: Negative for dysuria, urgency and frequency.  Neurological: Positive for weakness. Negative for tremors and headaches.      Objective  Filed Vitals:    03/09/16 1458  BP: 131/75  Pulse: 74  Temp: 98.4 F (36.9 C)  TempSrc: Oral  Resp: 16  Height: 5' 7"  (1.702 m)  Weight: 246 lb 3.2 oz (111.676 kg)  SpO2: 95%    Physical Exam  Constitutional: She is oriented to person, place, and time and well-developed, well-nourished, and in no distress. No distress.  Wearing O2.  HENT:  Head: Normocephalic and atraumatic.  Eyes: Conjunctivae and EOM are normal. Pupils are equal, round, and reactive to light. No scleral icterus.  Neck: Normal range of motion. Neck supple. Carotid bruit is not present. No thyromegaly present.  Cardiovascular: Normal rate, regular rhythm and normal heart sounds.  Exam reveals no gallop and no friction rub.   No murmur heard. Pulmonary/Chest: Effort normal and breath sounds normal. No respiratory distress. She has no wheezes. She has no rales.  Musculoskeletal: She exhibits no edema.  Lymphadenopathy:    She has no cervical adenopathy.  Neurological: She is alert and oriented to person, place, and time.  Vitals reviewed.      Recent Results (from the past 2160 hour(s))  Urine Culture     Status: Abnormal   Collection Time: 02/25/16 12:00 AM  Result Value Ref Range   Urine Culture, Routine Final report (A)    Urine Culture result 1 Escherichia coli (A)  Comment: Greater than 100,000 colony forming units per mL   ANTIMICROBIAL SUSCEPTIBILITY Comment     Comment:       ** S = Susceptible; I = Intermediate; R = Resistant **                    P = Positive; N = Negative             MICS are expressed in micrograms per mL    Antibiotic                 RSLT#1    RSLT#2    RSLT#3    RSLT#4 Amoxicillin/Clavulanic Acid    I Ampicillin                     R Cefazolin                      R Cefepime                       R Ceftriaxone                    R Cefuroxime                     R Cephalothin                    R Ciprofloxacin                  R Ertapenem                      S Gentamicin                      S Imipenem                       S Levofloxacin                   R Nitrofurantoin                 S Piperacillin                   R Tetracycline                   R Tobramycin                     S Trimethoprim/Sulfa             R   POCT urinalysis dipstick     Status: Abnormal   Collection Time: 02/25/16 10:35 AM  Result Value Ref Range   Color, UA yellow    Clarity, UA clear    Glucose, UA neg    Bilirubin, UA neg    Ketones, UA neg    Spec Grav, UA 1.010    Blood, UA neg    pH, UA 7.5    Protein, UA trace    Urobilinogen, UA 0.2    Nitrite, UA pos    Leukocytes, UA moderate (2+) (A) Negative  CBC with Differential     Status: Abnormal   Collection Time: 02/28/16  1:30 AM  Result Value Ref Range   WBC 21.6 (H) 3.6 - 11.0 K/uL   RBC 4.41 3.80 - 5.20 MIL/uL   Hemoglobin 12.9 12.0 - 16.0  g/dL   HCT 39.5 35.0 - 47.0 %   MCV 89.6 80.0 - 100.0 fL   MCH 29.4 26.0 - 34.0 pg   MCHC 32.8 32.0 - 36.0 g/dL   RDW 13.2 11.5 - 14.5 %   Platelets 171 150 - 440 K/uL   Neutrophils Relative % 86 %   Neutro Abs 18.7 (H) 1.4 - 6.5 K/uL   Lymphocytes Relative 4 %   Lymphs Abs 0.9 (L) 1.0 - 3.6 K/uL   Monocytes Relative 7 %   Monocytes Absolute 1.4 (H) 0.2 - 0.9 K/uL   Eosinophils Relative 3 %   Eosinophils Absolute 0.5 0 - 0.7 K/uL   Basophils Relative 0 %   Basophils Absolute 0.1 0 - 0.1 K/uL  Comprehensive metabolic panel     Status: Abnormal   Collection Time: 02/28/16  1:30 AM  Result Value Ref Range   Sodium 137 135 - 145 mmol/L   Potassium 3.9 3.5 - 5.1 mmol/L   Chloride 99 (L) 101 - 111 mmol/L   CO2 31 22 - 32 mmol/L   Glucose, Bld 212 (H) 65 - 99 mg/dL   BUN 25 (H) 6 - 20 mg/dL   Creatinine, Ser 1.04 (H) 0.44 - 1.00 mg/dL   Calcium 9.2 8.9 - 10.3 mg/dL   Total Protein 7.1 6.5 - 8.1 g/dL   Albumin 3.8 3.5 - 5.0 g/dL   AST 43 (H) 15 - 41 U/L   ALT 21 14 - 54 U/L   Alkaline Phosphatase 67 38 - 126 U/L   Total Bilirubin 0.7 0.3 - 1.2 mg/dL   GFR calc non Af  Amer 52 (L) >60 mL/min   GFR calc Af Amer 60 (L) >60 mL/min    Comment: (NOTE) The eGFR has been calculated using the [name] EPI equation. This calculation has not been validated in all clinical situations. eGFR's persistently <60 mL/min signify possible Chronic Kidney Disease.    Anion gap 7 5 - 15  Troponin I     Status: None   Collection Time: 02/28/16  1:30 AM  Result Value Ref Range   Troponin I <0.03 <0.031 ng/mL    Comment:        NO INDICATION OF MYOCARDIAL INJURY.   Brain natriuretic peptide     Status: Abnormal   Collection Time: 02/28/16  1:30 AM  Result Value Ref Range   B Natriuretic Peptide 172.0 (H) 0.0 - 100.0 pg/mL  Culture, blood (Routine X 2) w Reflex to ID Panel     Status: None   Collection Time: 02/28/16  1:37 AM  Result Value Ref Range   Specimen Description BLOOD LEFT ARM    Special Requests BOTTLES DRAWN AEROBIC AND ANAEROBIC 5ML    Culture NO GROWTH 5 DAYS    Report Status 03/04/2016 FINAL   Culture, blood (Routine X 2) w Reflex to ID Panel     Status: None   Collection Time: 02/28/16  1:37 AM  Result Value Ref Range   Specimen Description BLOOD RIGHT HAND    Special Requests BOTTLES DRAWN AEROBIC AND ANAEROBIC [place]    Culture NO GROWTH 5 DAYS    Report Status 03/04/2016 FINAL   Lactic acid, plasma     Status: None   Collection Time: 02/28/16  1:37 AM  Result Value Ref Range   Lactic Acid, Venous 1.6 0.5 - 2.0 mmol/L  Lactic acid, plasma     Status: None   Collection Time: 02/28/16  4:46 AM  Result Value  Ref Range   Lactic Acid, Venous 1.7 0.5 - 2.0 mmol/L  Glucose, capillary     Status: Abnormal   Collection Time: 02/28/16  5:34 AM  Result Value Ref Range   Glucose-Capillary 273 (H) 65 - 99 mg/dL  MRSA PCR Screening     Status: None   Collection Time: 02/28/16  5:37 AM  Result Value Ref Range   MRSA by PCR NEGATIVE NEGATIVE    Comment:        The GeneXpert MRSA Assay (FDA approved for NASAL specimens only), is one component of  a comprehensive MRSA colonization surveillance program. It is not intended to diagnose MRSA infection nor to guide or monitor treatment for MRSA infections.   Glucose, capillary     Status: Abnormal   Collection Time: 02/28/16  7:38 AM  Result Value Ref Range   Glucose-Capillary 296 (H) 65 - 99 mg/dL  CBC     Status: Abnormal   Collection Time: 02/28/16  7:59 AM  Result Value Ref Range   WBC 24.5 (H) 3.6 - 11.0 K/uL   RBC 4.31 3.80 - 5.20 MIL/uL   Hemoglobin 12.4 12.0 - 16.0 g/dL   HCT 38.3 35.0 - 47.0 %   MCV 88.8 80.0 - 100.0 fL   MCH 28.8 26.0 - 34.0 pg   MCHC 32.4 32.0 - 36.0 g/dL   RDW 13.3 11.5 - 14.5 %   Platelets 169 150 - 440 K/uL  Basic metabolic panel     Status: Abnormal   Collection Time: 02/28/16  7:59 AM  Result Value Ref Range   Sodium 136 135 - 145 mmol/L   Potassium 4.7 3.5 - 5.1 mmol/L   Chloride 99 (L) 101 - 111 mmol/L   CO2 30 22 - 32 mmol/L   Glucose, Bld 326 (H) 65 - 99 mg/dL   BUN 30 (H) 6 - 20 mg/dL   Creatinine, Ser 1.12 (H) 0.44 - 1.00 mg/dL   Calcium 8.9 8.9 - 10.3 mg/dL   GFR calc non Af Amer 47 (L) >60 mL/min   GFR calc Af Amer 55 (L) >60 mL/min    Comment: (NOTE) The eGFR has been calculated using the [name] EPI equation. This calculation has not been validated in all clinical situations. eGFR's persistently <60 mL/min signify possible Chronic Kidney Disease.    Anion gap 7 5 - 15  Hemoglobin A1c     Status: Abnormal   Collection Time: 02/28/16  7:59 AM  Result Value Ref Range   Hgb A1c MFr Bld 7.1 (H) 4.0 - 6.0 %  Glucose, capillary     Status: Abnormal   Collection Time: 02/28/16 12:21 PM  Result Value Ref Range   Glucose-Capillary 314 (H) 65 - 99 mg/dL  Glucose, capillary     Status: Abnormal   Collection Time: 02/28/16  4:13 PM  Result Value Ref Range   Glucose-Capillary 277 (H) 65 - 99 mg/dL  Glucose, capillary     Status: Abnormal   Collection Time: 02/28/16  9:53 PM  Result Value Ref Range   Glucose-Capillary 230 (H) 65 -  99 mg/dL  Glucose, capillary     Status: Abnormal   Collection Time: 02/29/16 12:00 PM  Result Value Ref Range   Glucose-Capillary 201 (H) 65 - 99 mg/dL  Glucose, capillary     Status: Abnormal   Collection Time: 02/29/16  4:31 PM  Result Value Ref Range   Glucose-Capillary 169 (H) 65 - 99 mg/dL  Glucose, capillary  Status: Abnormal   Collection Time: 02/29/16  9:21 PM  Result Value Ref Range   Glucose-Capillary 194 (H) 65 - 99 mg/dL  Glucose, capillary     Status: Abnormal   Collection Time: 03/01/16  7:28 AM  Result Value Ref Range   Glucose-Capillary 188 (H) 65 - 99 mg/dL  Glucose, capillary     Status: Abnormal   Collection Time: 03/01/16 11:28 AM  Result Value Ref Range   Glucose-Capillary 237 (H) 65 - 99 mg/dL     Assessment & Plan  Problem List Items Addressed This Visit      Cardiovascular and Mediastinum   Essential (primary) hypertension     Respiratory   Acute exacerbation of chronic obstructive airways disease ([facility])   Pneumonia - Primary     Endocrine   Diabetes mellitus, type 2 ([place])      No orders of the defined types were placed in this encounter.   1. Bilateral pneumonia Resolved  2. Essential (primary) hypertension   3. Acute exacerbation of chronic obstructive airways disease ([place])   4. Type 2 diabetes mellitus with diabetic nephropathy, without long-term current use of insulin ([place])

## 2016-03-11 ENCOUNTER — Other Ambulatory Visit: Payer: Self-pay | Admitting: Family Medicine

## 2016-03-11 ENCOUNTER — Inpatient Hospital Stay: Payer: Commercial Managed Care - HMO | Admitting: Family Medicine

## 2016-03-11 DIAGNOSIS — E119 Type 2 diabetes mellitus without complications: Secondary | ICD-10-CM

## 2016-03-11 MED ORDER — ACCU-CHEK AVIVA CONNECT W/DEVICE KIT: 1.0000 | PACK | Freq: Two times a day (BID) | Status: DC

## 2016-03-11 NOTE — Addendum Note (Signed)
Addended by: Frederich Cha D on: 03/11/2016 02:50 PM   Modules accepted: Orders

## 2016-03-12 DIAGNOSIS — G4733 Obstructive sleep apnea (adult) (pediatric): Secondary | ICD-10-CM | POA: Diagnosis not present

## 2016-03-12 DIAGNOSIS — I10 Essential (primary) hypertension: Secondary | ICD-10-CM | POA: Diagnosis not present

## 2016-03-17 ENCOUNTER — Other Ambulatory Visit: Payer: Self-pay

## 2016-03-17 ENCOUNTER — Other Ambulatory Visit: Payer: Self-pay | Admitting: Family Medicine

## 2016-03-17 DIAGNOSIS — N390 Urinary tract infection, site not specified: Secondary | ICD-10-CM

## 2016-03-18 DIAGNOSIS — R0609 Other forms of dyspnea: Secondary | ICD-10-CM | POA: Diagnosis not present

## 2016-03-18 DIAGNOSIS — J439 Emphysema, unspecified: Secondary | ICD-10-CM | POA: Diagnosis not present

## 2016-03-19 ENCOUNTER — Other Ambulatory Visit: Payer: Self-pay | Admitting: *Deleted

## 2016-03-19 LAB — URINE CULTURE

## 2016-03-19 MED ORDER — CEFUROXIME AXETIL 500 MG PO TABS: 500.0000 mg | ORAL_TABLET | Freq: Two times a day (BID) | ORAL | Status: DC

## 2016-03-20 DIAGNOSIS — J449 Chronic obstructive pulmonary disease, unspecified: Secondary | ICD-10-CM | POA: Diagnosis not present

## 2016-03-24 DIAGNOSIS — G5600 Carpal tunnel syndrome, unspecified upper limb: Secondary | ICD-10-CM | POA: Diagnosis not present

## 2016-03-24 DIAGNOSIS — G5601 Carpal tunnel syndrome, right upper limb: Secondary | ICD-10-CM | POA: Diagnosis not present

## 2016-03-26 ENCOUNTER — Other Ambulatory Visit: Payer: Self-pay | Admitting: Family Medicine

## 2016-03-26 MED ORDER — VALACYCLOVIR HCL 1 G PO TABS: 1000.0000 mg | ORAL_TABLET | Freq: Every day | ORAL | Status: DC

## 2016-03-26 MED ORDER — MONTELUKAST SODIUM 10 MG PO TABS
10.0000 mg | ORAL_TABLET | Freq: Every day | ORAL | Status: DC
Start: 2016-03-26 — End: 2017-03-24

## 2016-04-03 ENCOUNTER — Encounter: Payer: Self-pay | Admitting: Family Medicine

## 2016-04-03 ENCOUNTER — Ambulatory Visit (INDEPENDENT_AMBULATORY_CARE_PROVIDER_SITE_OTHER): Payer: Commercial Managed Care - HMO | Admitting: Family Medicine

## 2016-04-03 VITALS — BP 109/66 | HR 71 | Temp 98.1°F | Resp 16 | Ht 69.0 in | Wt 242.0 lb

## 2016-04-03 DIAGNOSIS — R300 Dysuria: Secondary | ICD-10-CM | POA: Diagnosis not present

## 2016-04-03 DIAGNOSIS — N390 Urinary tract infection, site not specified: Secondary | ICD-10-CM | POA: Diagnosis not present

## 2016-04-03 LAB — POCT URINALYSIS DIPSTICK
BILIRUBIN UA: NEGATIVE
Blood, UA: NEGATIVE
Glucose, UA: NEGATIVE
Ketones, UA: NEGATIVE
PH UA: 7.5
Protein, UA: NEGATIVE
Spec Grav, UA: 1.015
UROBILINOGEN UA: NEGATIVE

## 2016-04-03 MED ORDER — CEFUROXIME AXETIL 250 MG PO TABS: 250.0000 mg | ORAL_TABLET | Freq: Two times a day (BID) | ORAL | Status: DC

## 2016-04-03 NOTE — Progress Notes (Signed)
 Subjective:    Patient ID: Megan Bolton, female    DOB: 03/01/1941, 75 y.o.   MRN: [id number]  HPI: Megan Bolton is a 75 y.o. female presenting on 04/03/2016 for Urinary Tract Infection   HPI  Pt presents for possible UTI. She has just completed a course of macrobid for her UTI and is now expericing dysuria symptoms. Previous UTI was susceptible to cephalosporins and macrobid. She completed both for UTI.  She has had 4 UTIs since August of this year. 1 hospitalization for urosepsis at [facility].  Symptoms began yesterday with dysuria and pelvic discomfort. No nausea. No fevers. No flank pain. No hematuria.   Past Medical History  Diagnosis Date  . Diabetes mellitus without complication ([facility])   . Hypertension   . COPD (chronic obstructive pulmonary disease) ([facility])   . Hyperlipidemia     Current Outpatient Prescriptions on File Prior to Visit  Medication Sig  . ADVAIR DISKUS 250-50 MCG/DOSE AEPB INHALE 1 PUFF TWICE DAILY RINSE MOUTH AFTER EACH USE  . albuterol (PROVENTIL) (2.5 MG/3ML) 0.083% nebulizer solution Take 3 mLs (2.5 mg total) by nebulization every 6 (six) hours as needed for wheezing.  . albuterol (VENTOLIN HFA) 108 (90 Base) MCG/ACT inhaler Inhale 2 puffs into the lungs every 6 (six) hours as needed for wheezing or shortness of breath.  . amLODipine (NORVASC) 2.5 MG tablet   . Blood Glucose Monitoring Suppl (ACCU-CHEK AVIVA CONNECT) w/Device KIT 1 kit by Other route 2 (two) times daily.  . carvedilol (COREG) 25 MG tablet TAKE 1 TABLET BY MOUTH TWICE DAILY  . clonazePAM (KLONOPIN) 0.5 MG tablet   . furosemide (LASIX) 20 MG tablet Take 1 tablet (20 mg total) by mouth daily.  . gabapentin (NEURONTIN) 600 MG tablet Reported on 03/09/2016  . gabapentin (NEURONTIN) 800 MG tablet Take 1 tablet three times a day.  . indapamide (LOZOL) 1.25 MG tablet TAKE ONE TABLET BY MOUTH EVERY DAY  . levofloxacin (LEVAQUIN) 500 MG tablet Take 1 tablet (500 mg total) by mouth daily.  . lisinopril  (PRINIVIL,ZESTRIL) 40 MG tablet TAKE ONE (1) TABLET BY MOUTH EVERY DAY  . metFORMIN (GLUCOPHAGE) 500 MG tablet   . montelukast (SINGULAIR) 10 MG tablet Take 1 tablet (10 mg total) by mouth at bedtime.  . oxyCODONE (OXY IR/ROXICODONE) 5 MG immediate release tablet   . potassium chloride (K-DUR) 10 MEQ tablet 10 mEq.  . predniSONE (DELTASONE) 5 MG tablet 3 tabs po day1; 2 tabs po day2; 1 tab po day3,4  . SPIRIVA HANDIHALER 18 MCG inhalation capsule ONE CAPSULE INHALED VIA UNIT ONCE EVERY 24 HOURS  . valACYclovir (VALTREX) 1000 MG tablet Take 1 tablet (1,000 mg total) by mouth daily.   No current facility-administered medications on file prior to visit.    Review of Systems  Constitutional: Negative for fever and chills.  Respiratory: Negative for chest tightness, shortness of breath and wheezing.   Cardiovascular: Negative for chest pain, palpitations and leg swelling.  Gastrointestinal: Negative for nausea and vomiting.  Genitourinary: Positive for dysuria, urgency and frequency. Negative for hematuria, flank pain, vaginal bleeding, vaginal discharge and vaginal pain.   Per HPI unless specifically indicated above     Objective:    BP 109/66 mmHg  Pulse 71  Temp(Src) 98.1 F (36.7 C) (Oral)  Resp 16  Ht 5' 9" (1.753 m)  Wt 242 lb (109.77 kg)  BMI 35.72 kg/m2  Wt Readings from Last 3 Encounters:  04/03/16 242 lb (109.77 kg)  03/09/16   246 lb 3.2 oz (111.676 kg)  02/28/16 242 lb 8.1 oz (110 kg)    Physical Exam  Constitutional: She is oriented to person, place, and time. She appears well-developed and well-nourished. No distress.  HENT:  Head: Normocephalic and atraumatic.  Cardiovascular: Normal rate and regular rhythm.  Exam reveals no gallop and no friction rub.   No murmur heard. Pulmonary/Chest: Effort normal and breath sounds normal. No respiratory distress.  Abdominal: Soft. Normal appearance. There is tenderness in the suprapubic area. There is no CVA tenderness.    Neurological: She is alert and oriented to person, place, and time. No cranial nerve deficit. Coordination normal.  Skin: She is not diaphoretic.  Psychiatric: Her behavior is normal.   Results for orders placed or performed in visit on 04/03/16  POCT urinalysis dipstick  Result Value Ref Range   Color, UA yellow    Clarity, UA clear    Glucose, UA negative    Bilirubin, UA negative    Ketones, UA negative    Spec Grav, UA 1.015    Blood, UA negative    pH, UA 7.5    Protein, UA negative    Urobilinogen, UA negative    Nitrite, UA trace    Leukocytes, UA Trace (A) Negative      Assessment & Plan:   Problem List Items Addressed This Visit    None    Visit Diagnoses    Recurrent UTI    -  Primary    Treat for UTI. Ceftin 250 BID for 7 days. Pending urine culture. Refer to urology for evaluation management of frequent UTIs. Alarm symptoms reviewed To ER with concerning symptoms.    Relevant Medications    cefUROXime (CEFTIN) 250 MG tablet    Other Relevant Orders    POCT urinalysis dipstick (Completed)    CULTURE, URINE COMPREHENSIVE    Ambulatory referral to Urology       Meds ordered this encounter  Medications  . ALPRAZolam (XANAX) 1 MG tablet    Sig: Take 1 mg by mouth.  . diazepam (VALIUM) 5 MG tablet    Sig: Take 5 mg by mouth.  . DISCONTD: cefUROXime (CEFTIN) 500 MG tablet    Sig: Take 500 mg by mouth.  . cefUROXime (CEFTIN) 250 MG tablet    Sig: Take 1 tablet (250 mg total) by mouth 2 (two) times daily with a meal.    Dispense:  14 tablet    Refill:  0    Order Specific Question:  Supervising Provider    Answer:  HAWKINS JR, JAMES H [[id number]]      Follow up plan: Return if symptoms worsen or fail to improve, for please follow-up with urology. .  

## 2016-04-03 NOTE — Patient Instructions (Signed)
You are being treated for a urinary tract infection today. Please take your antibiotic as directed. If you develop severe flank pain, blood in the urine, fever, nausea or vomiting, please seek immediate medical attention in the ER.   We will have you seen by urology to determine why you keep getting so many infections.

## 2016-04-05 LAB — CULTURE, URINE COMPREHENSIVE

## 2016-04-06 DIAGNOSIS — M5430 Sciatica, unspecified side: Secondary | ICD-10-CM | POA: Diagnosis not present

## 2016-04-06 DIAGNOSIS — G894 Chronic pain syndrome: Secondary | ICD-10-CM | POA: Diagnosis not present

## 2016-04-06 DIAGNOSIS — R69 Illness, unspecified: Secondary | ICD-10-CM | POA: Diagnosis not present

## 2016-04-06 DIAGNOSIS — M150 Primary generalized (osteo)arthritis: Secondary | ICD-10-CM | POA: Diagnosis not present

## 2016-04-06 NOTE — Addendum Note (Signed)
Addended byVincenza Hews, Harland Aguiniga L on: 04/06/2016 04:56 PM   Modules accepted: SmartSet

## 2016-04-09 ENCOUNTER — Ambulatory Visit: Payer: Commercial Managed Care - HMO | Admitting: Family Medicine

## 2016-04-09 DIAGNOSIS — J449 Chronic obstructive pulmonary disease, unspecified: Secondary | ICD-10-CM | POA: Diagnosis not present

## 2016-04-09 DIAGNOSIS — J438 Other emphysema: Secondary | ICD-10-CM | POA: Diagnosis not present

## 2016-04-12 DIAGNOSIS — G4733 Obstructive sleep apnea (adult) (pediatric): Secondary | ICD-10-CM | POA: Diagnosis not present

## 2016-04-12 DIAGNOSIS — I10 Essential (primary) hypertension: Secondary | ICD-10-CM | POA: Diagnosis not present

## 2016-04-17 ENCOUNTER — Inpatient Hospital Stay
Admission: EM | Admit: 2016-04-17 | Discharge: 2016-04-19 | DRG: 872 | Disposition: A | Discharge status: Home / Self Care | Payer: Commercial Managed Care - HMO | Attending: Internal Medicine | Admitting: Internal Medicine

## 2016-04-17 ENCOUNTER — Emergency Department: Payer: Commercial Managed Care - HMO

## 2016-04-17 DIAGNOSIS — Z79899 Other long term (current) drug therapy: Secondary | ICD-10-CM

## 2016-04-17 DIAGNOSIS — Z9981 Dependence on supplemental oxygen: Secondary | ICD-10-CM | POA: Diagnosis not present

## 2016-04-17 DIAGNOSIS — R509 Fever, unspecified: Secondary | ICD-10-CM

## 2016-04-17 DIAGNOSIS — J441 Chronic obstructive pulmonary disease with (acute) exacerbation: Secondary | ICD-10-CM | POA: Diagnosis present

## 2016-04-17 DIAGNOSIS — Z7952 Long term (current) use of systemic steroids: Secondary | ICD-10-CM

## 2016-04-17 DIAGNOSIS — E876 Hypokalemia: Secondary | ICD-10-CM | POA: Diagnosis present

## 2016-04-17 DIAGNOSIS — N3020 Other chronic cystitis without hematuria: Secondary | ICD-10-CM | POA: Insufficient documentation

## 2016-04-17 DIAGNOSIS — Z87442 Personal history of urinary calculi: Secondary | ICD-10-CM

## 2016-04-17 DIAGNOSIS — J440 Chronic obstructive pulmonary disease with acute lower respiratory infection: Secondary | ICD-10-CM | POA: Diagnosis present

## 2016-04-17 DIAGNOSIS — Z7984 Long term (current) use of oral hypoglycemic drugs: Secondary | ICD-10-CM | POA: Diagnosis not present

## 2016-04-17 DIAGNOSIS — G4733 Obstructive sleep apnea (adult) (pediatric): Secondary | ICD-10-CM | POA: Diagnosis present

## 2016-04-17 DIAGNOSIS — N390 Urinary tract infection, site not specified: Secondary | ICD-10-CM | POA: Diagnosis not present

## 2016-04-17 DIAGNOSIS — E119 Type 2 diabetes mellitus without complications: Secondary | ICD-10-CM | POA: Diagnosis not present

## 2016-04-17 DIAGNOSIS — Z7951 Long term (current) use of inhaled steroids: Secondary | ICD-10-CM

## 2016-04-17 DIAGNOSIS — I10 Essential (primary) hypertension: Secondary | ICD-10-CM | POA: Diagnosis not present

## 2016-04-17 DIAGNOSIS — Z90710 Acquired absence of both cervix and uterus: Secondary | ICD-10-CM | POA: Diagnosis not present

## 2016-04-17 DIAGNOSIS — N200 Calculus of kidney: Secondary | ICD-10-CM | POA: Diagnosis not present

## 2016-04-17 DIAGNOSIS — Z87891 Personal history of nicotine dependence: Secondary | ICD-10-CM

## 2016-04-17 DIAGNOSIS — E785 Hyperlipidemia, unspecified: Secondary | ICD-10-CM | POA: Diagnosis present

## 2016-04-17 DIAGNOSIS — Z8249 Family history of ischemic heart disease and other diseases of the circulatory system: Secondary | ICD-10-CM

## 2016-04-17 DIAGNOSIS — J209 Acute bronchitis, unspecified: Secondary | ICD-10-CM | POA: Diagnosis present

## 2016-04-17 DIAGNOSIS — Z9989 Dependence on other enabling machines and devices: Secondary | ICD-10-CM

## 2016-04-17 DIAGNOSIS — R0602 Shortness of breath: Secondary | ICD-10-CM | POA: Diagnosis not present

## 2016-04-17 DIAGNOSIS — A419 Sepsis, unspecified organism: Principal | ICD-10-CM | POA: Diagnosis present

## 2016-04-17 DIAGNOSIS — Z87440 Personal history of urinary (tract) infections: Secondary | ICD-10-CM

## 2016-04-17 LAB — COMPREHENSIVE METABOLIC PANEL
ALBUMIN: 3.8 g/dL (ref 3.5–5.0)
ALK PHOS: 61 U/L (ref 38–126)
ALT: 39 U/L (ref 14–54)
AST: 101 U/L — AB (ref 15–41)
Anion gap: 7 (ref 5–15)
BILIRUBIN TOTAL: 0.9 mg/dL (ref 0.3–1.2)
BUN: 32 mg/dL — AB (ref 6–20)
CALCIUM: 8.9 mg/dL (ref 8.9–10.3)
CO2: 29 mmol/L (ref 22–32)
CREATININE: 1.21 mg/dL — AB (ref 0.44–1.00)
Chloride: 103 mmol/L (ref 101–111)
GFR calc Af Amer: 50 mL/min — ABNORMAL LOW (ref >60)
GFR calc non Af Amer: 43 mL/min — ABNORMAL LOW (ref >60)
GLUCOSE: 222 mg/dL — AB (ref 65–99)
Potassium: 3.7 mmol/L (ref 3.5–5.1)
SODIUM: 139 mmol/L (ref 135–145)
TOTAL PROTEIN: 6.8 g/dL (ref 6.5–8.1)

## 2016-04-17 LAB — CBC WITH DIFFERENTIAL/PLATELET
BASOS PCT: 0 %
Basophils Absolute: 0.1 10*3/uL (ref 0–0.1)
EOS ABS: 0.1 10*3/uL (ref 0–0.7)
Eosinophils Relative: 1 %
HCT: 38.1 % (ref 35.0–47.0)
Hemoglobin: 12.4 g/dL (ref 12.0–16.0)
LYMPHS ABS: 0.8 10*3/uL — AB (ref 1.0–3.6)
Lymphocytes Relative: 4 %
MCH: 29.5 pg (ref 26.0–34.0)
MCHC: 32.5 g/dL (ref 32.0–36.0)
MCV: 90.8 fL (ref 80.0–100.0)
MONO ABS: 1.1 10*3/uL — AB (ref 0.2–0.9)
MONOS PCT: 6 %
Neutro Abs: 16.8 10*3/uL — ABNORMAL HIGH (ref 1.4–6.5)
Neutrophils Relative %: 89 %
Platelets: 170 10*3/uL (ref 150–440)
RBC: 4.2 MIL/uL (ref 3.80–5.20)
RDW: 13.8 % (ref 11.5–14.5)
WBC: 18.9 10*3/uL — ABNORMAL HIGH (ref 3.6–11.0)

## 2016-04-17 LAB — PROTIME-INR
INR: 1.05
PROTHROMBIN TIME: 13.9 s (ref 11.4–15.0)

## 2016-04-17 LAB — APTT: APTT: 27 s (ref 24–36)

## 2016-04-17 LAB — LACTIC ACID, PLASMA: Lactic Acid, Venous: 2.1 mmol/L (ref 0.5–2.0)

## 2016-04-17 MED ORDER — IPRATROPIUM-ALBUTEROL 0.5-2.5 (3) MG/3ML IN SOLN
RESPIRATORY_TRACT | Status: AC
  Filled 2016-04-17: qty 3

## 2016-04-17 MED ORDER — ACETAMINOPHEN 325 MG PO TABS
650.0000 mg | ORAL_TABLET | Freq: Once | ORAL | Status: AC
  Administered 2016-04-17: 650 mg via ORAL

## 2016-04-17 MED ORDER — SODIUM CHLORIDE 0.9 % IV BOLUS (SEPSIS)
500.0000 mL | INTRAVENOUS | Status: AC
  Administered 2016-04-18: 03:00:00 500 mL via INTRAVENOUS

## 2016-04-17 MED ORDER — SODIUM CHLORIDE 0.9 % IV BOLUS (SEPSIS)
1000.0000 mL | INTRAVENOUS | Status: AC
  Administered 2016-04-17 – 2016-04-18 (×3): 1000 mL via INTRAVENOUS

## 2016-04-17 MED ORDER — VANCOMYCIN HCL IN DEXTROSE 1-5 GM/200ML-% IV SOLN
1000.0000 mg | Freq: Once | INTRAVENOUS | Status: AC
  Administered 2016-04-17: 1000 mg via INTRAVENOUS
  Filled 2016-04-17: qty 200

## 2016-04-17 MED ORDER — ACETAMINOPHEN 500 MG PO TABS
ORAL_TABLET | ORAL | Status: AC
  Filled 2016-04-17: qty 2

## 2016-04-17 MED ORDER — IPRATROPIUM-ALBUTEROL 0.5-2.5 (3) MG/3ML IN SOLN
3.0000 mL | Freq: Once | RESPIRATORY_TRACT | Status: AC
  Administered 2016-04-17: 3 mL via RESPIRATORY_TRACT

## 2016-04-17 MED ORDER — PIPERACILLIN-TAZOBACTAM 3.375 G IVPB 30 MIN
3.3750 g | Freq: Once | INTRAVENOUS | Status: AC
  Administered 2016-04-18: 3.375 g via INTRAVENOUS
  Filled 2016-04-17: qty 50

## 2016-04-17 NOTE — ED Notes (Signed)
Pt with shob for two hours. Pt states recently started macrobid for uti and was worried was having an allergic reaction. Pt given duoneb enroute with ems. Pt on oxygen 2lpm chronically. Pt denies pain, cough, known fever or orthopnea.

## 2016-04-17 NOTE — ED Provider Notes (Signed)
[facility] Emergency Department Provider Note  ____________________________________________  Time seen: Approximately 11:29 PM  I have reviewed the triage vital signs and the nursing notes.   HISTORY  Chief Complaint Shortness of Breath    HPI Megan Bolton is a 75 y.o. female with a history of COPD on 2 L of oxygen at baseline who presents by EMS for evaluation of difficulty breathing and fever.  She reports that she saw Dr. Jacqlyn Larsen the urologist earlier today who started her on Macrobid for possible UTI.  She has had 1 dose of Macrobid.  About 4 hours ago she developed gradual onset difficulty breathing with increased work of breathing and feeling like she cannot catch her breath.  This is accompanied with subjective fever and chills/rigors.  She denies nausea, vomiting, chest pain, abdominal pain, dysuria, flank pain.  She states she feels similar to the way she did in the past when she had pneumonia.  She describes the symptoms as severe.  She had a DuoNeb en route to the hospital which did not improve her symptoms.  Nothing seems to make them worse.  When she arrived in the ED she was noted to have fever of nearly 103 and a pulse of 110 with a respiratory rate of 26.  Her oxygen saturation on 2 L is 96%.   Past Medical History  Diagnosis Date  . Diabetes mellitus without complication ([place])   . Hypertension   . COPD (chronic obstructive pulmonary disease) ([place])   . Hyperlipidemia     Patient Active Problem List   Diagnosis Date Noted  . Community acquired pneumonia 02/28/2016  . Pneumonia 02/28/2016  . [name] (chronic kidney disease) 07/12/2015  . Diabetes mellitus, type 2 ([place]) 06/11/2015  . Arthritis, degenerative 06/11/2015  . Hypercholesteremia 06/11/2015  . BP (high blood pressure) 06/11/2015  . 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 Injury of kidney 04/14/2015  . Acute exacerbation of chronic obstructive airways disease ([place])  04/14/2015  . Essential (primary) hypertension 04/14/2015  . Blood glucose elevated 04/14/2015  . Apnea, sleep 04/14/2015  . Chronic obstructive pulmonary disease with acute exacerbation ([place]) 04/14/2015  . DDD (degenerative disc disease), lumbar 08/01/2014  . Lumbar canal stenosis 05/11/2014  . Neuritis or radiculitis due to rupture of lumbar intervertebral disc 05/11/2014  . 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 Lumbosacral spondylosis 05/11/2014  . Thoracic and lumbosacral neuritis 05/11/2014  . Accelerating angina ([place]) 03/13/2014    Past Surgical History  Procedure Laterality Date  . Abdominal hysterectomy    . Spine surgery        Allergies Review of patient's allergies indicates no known allergies.  Family History  Problem Relation Age of Onset  . Hypertension Paternal Grandfather     Social History Social History  Substance Use Topics  . Smoking status: Former Smoker -- 0.50 packs/day    Quit date: 04/28/2015  . Smokeless tobacco: Never Used  . Alcohol Use: No    Review of Systems Constitutional: +fever/chills Eyes: No visual changes. ENT: No sore throat. Cardiovascular: Denies chest pain. Respiratory: +shortness of breath. Gastrointestinal: No abdominal pain.  No nausea, no vomiting.  No diarrhea.  No constipation. Genitourinary: Negative for dysuria. Musculoskeletal: Negative for back pain. Skin: Negative for rash. Neurological: Negative for headaches, focal weakness or numbness.  10-point ROS otherwise negative.  ____________________________________________   PHYSICAL EXAM:  VITAL SIGNS: ED Triage Vitals  Enc Vitals Group     BP 04/17/16 2313 136/73 mmHg  Pulse Rate 04/17/16 2310 110     Resp 04/17/16 2310 26     Temp 04/17/16 2310 102.9 F (39.4 C)     Temp Source 04/17/16 2310 Oral     SpO2 04/17/16 2310 96 %     Weight 04/17/16 2310 245 lb (111.131 kg)     Height 04/17/16 2310 5\' 9"  (1.753 m)     Head Cir --       Peak Flow --      Pain Score --      Pain Loc --      Pain Edu? --      Excl. in [place]? --     Constitutional: Alert and oriented. Well appearing, Mild respiratory distress with slightly increased work of breathing Eyes: Conjunctivae are normal. PERRL. EOMI. Head: Atraumatic. Nose: No congestion/rhinnorhea. Mouth/Throat: Mucous membranes are moist.  Oropharynx non-erythematous. Neck: No stridor.  No meningeal signs.   Cardiovascular: Tachycardia, regular rhythm. Good peripheral circulation. Grossly normal heart sounds.   Respiratory: Increased respiratory effort with tachypnea.  No retractions.  Minimal expiratory wheezing throughout. Gastrointestinal: Obese.  Soft and nontender. No distention.  Musculoskeletal: No lower extremity tenderness nor edema. No gross deformities of extremities. Neurologic:  Normal speech and language. No gross focal neurologic deficits are appreciated.  Skin:  Skin is warm, dry and intact. No rash noted. Psychiatric: Mood and affect are normal. Speech and behavior are normal.  No evidence of altered mental status.  ____________________________________________   LABS (all labs ordered are listed, but only abnormal results are displayed)  Labs Reviewed  COMPREHENSIVE METABOLIC PANEL - Abnormal; Notable for the following:    Glucose, Bld 222 (*)    BUN 32 (*)    Creatinine, Ser 1.21 (*)    AST 101 (*)    GFR calc non Af Amer 43 (*)    GFR calc Af Amer 50 (*)    All other components within normal limits  LACTIC ACID, PLASMA - Abnormal; Notable for the following:    Lactic Acid, Venous 2.1 (*)    All other components within normal limits  CBC WITH DIFFERENTIAL/PLATELET - Abnormal; Notable for the following:    WBC 18.9 (*)    Neutro Abs 16.8 (*)    Lymphs Abs 0.8 (*)    Monocytes Absolute 1.1 (*)    All other components within normal limits  URINALYSIS COMPLETEWITH MICROSCOPIC ([facility] ONLY) - Abnormal; Notable for the following:    Color, Urine YELLOW  (*)    APPearance CLEAR (*)    Hgb urine dipstick 1+ (*)    Bacteria, UA RARE (*)    Squamous Epithelial / LPF 0-5 (*)    All other components within normal limits  CULTURE, BLOOD (ROUTINE X 2)  CULTURE, BLOOD (ROUTINE X 2)  URINE CULTURE  TROPONIN I  APTT  PROTIME-INR  LACTIC ACID, PLASMA  LIPASE, BLOOD   ____________________________________________  EKG  ED ECG REPORT I, Lavra Imler, the attending physician, personally viewed and interpreted this ECG.  Date: 04/17/2016 EKG Time: 23:10 Rate: 108 Rhythm: Sinus tachycardia QRS Axis: normal Intervals: normal ST/T Wave abnormalities: Non-specific ST segment / T-wave changes, but no evidence of acute ischemia. Conduction Disturbances: none Narrative Interpretation: There is a significant amount of baseline wander and some artifact present, but there is no evidence of acute ischemia.  ____________________________________________  RADIOLOGY   Dg Chest 2 View  04/17/2016  CLINICAL DATA:  Initial evaluation for acute shortness of breath. EXAM: CHEST  2 VIEW  COMPARISON:  Prior study 02/28/2016. FINDINGS: Mild cardiomegaly is stable. Atheromatous plaque within the aortic arch. Mediastinal silhouette within normal limits. Trach air column midline and patent. Lungs are normally inflated. Changes related to COPD noted. No consolidative airspace disease. No pulmonary edema. No large pleural effusion. No pneumothorax. No acute osseous abnormality. IMPRESSION: 1. No active cardiopulmonary disease. 2. COPD. Electronically Signed   By: Jeannine Boga M.D.   On: 04/17/2016 23:47    ____________________________________________   PROCEDURES  Procedure(s) performed: None  Critical Care performed: Yes, see critical care note(s)  CRITICAL CARE Performed by: Hinda Kehr   Total critical care time: 30 minutes  Critical care time was exclusive of separately billable procedures and treating other patients.  Critical care was  necessary to treat or prevent imminent or life-threatening deterioration.  Critical care was time spent personally by me on the following activities: development of treatment plan with patient and/or surrogate as well as nursing, discussions with consultants, evaluation of patient's response to treatment, examination of patient, obtaining history from patient or surrogate, ordering and performing treatments and interventions, ordering and review of laboratory studies, ordering and review of radiographic studies, pulse oximetry and re-evaluation of patient's condition.  ____________________________________________   INITIAL IMPRESSION / ASSESSMENT AND PLAN / ED COURSE  Pertinent labs & imaging results that were available during my care of the patient were reviewed by me and considered in my medical decision making (see chart for details).  Shortly after arrival in the emergency department the patient's nurse let me know about her presenting vital signs and I asked the secretary to call a code sepsis.  We are treating empirically for an unknown source although it is most likely either pneumonia or UTI.  She meets sepsis criteria although we do not yet have a specific source.  She is getting Tylenol for fever control.  Full workup is pending.  He has no evidence of altered mental status and is normotensive at this time.  Proceeding with 30 mL/kg of IV fluids per protocol as well as empiric antibiotics of Zosyn and vancomycin.  ----------------------------------------- 12:44 AM on 04/18/2016 -----------------------------------------  Lactate elevated at 2.1.  No obvious source at this point, I suspect a pneumonia will develop eventually given that the urine was clear.  The patient is resting quietly and remains tachycardic at about 110 but fortunately is normotensive.  I will continue with the plan and treatment and admit for sepsis and shortness of  breath. ____________________________________________  FINAL CLINICAL IMPRESSION(S) / ED DIAGNOSES  Final diagnoses:  Sepsis, due to unspecified organism ([place])  Fever, unspecified fever cause  Shortness of breath      NEW MEDICATIONS STARTED DURING THIS VISIT:  New Prescriptions   No medications on file      Note:  This document was prepared using Dragon voice recognition software and may include unintentional dictation errors.   Hinda Kehr, MD 04/18/16 [phone]

## 2016-04-18 ENCOUNTER — Encounter: Payer: Self-pay | Admitting: Internal Medicine

## 2016-04-18 ENCOUNTER — Inpatient Hospital Stay: Payer: Commercial Managed Care - HMO

## 2016-04-18 DIAGNOSIS — N390 Urinary tract infection, site not specified: Secondary | ICD-10-CM | POA: Diagnosis present

## 2016-04-18 DIAGNOSIS — Z87440 Personal history of urinary (tract) infections: Secondary | ICD-10-CM | POA: Diagnosis not present

## 2016-04-18 DIAGNOSIS — Z8249 Family history of ischemic heart disease and other diseases of the circulatory system: Secondary | ICD-10-CM | POA: Diagnosis not present

## 2016-04-18 DIAGNOSIS — E876 Hypokalemia: Secondary | ICD-10-CM | POA: Diagnosis present

## 2016-04-18 DIAGNOSIS — J209 Acute bronchitis, unspecified: Secondary | ICD-10-CM | POA: Diagnosis present

## 2016-04-18 DIAGNOSIS — J440 Chronic obstructive pulmonary disease with acute lower respiratory infection: Secondary | ICD-10-CM | POA: Diagnosis present

## 2016-04-18 DIAGNOSIS — G4733 Obstructive sleep apnea (adult) (pediatric): Secondary | ICD-10-CM | POA: Diagnosis present

## 2016-04-18 DIAGNOSIS — Z7952 Long term (current) use of systemic steroids: Secondary | ICD-10-CM | POA: Diagnosis not present

## 2016-04-18 DIAGNOSIS — A419 Sepsis, unspecified organism: Secondary | ICD-10-CM | POA: Diagnosis present

## 2016-04-18 DIAGNOSIS — E785 Hyperlipidemia, unspecified: Secondary | ICD-10-CM | POA: Diagnosis present

## 2016-04-18 DIAGNOSIS — Z7951 Long term (current) use of inhaled steroids: Secondary | ICD-10-CM | POA: Diagnosis not present

## 2016-04-18 DIAGNOSIS — Z9981 Dependence on supplemental oxygen: Secondary | ICD-10-CM | POA: Diagnosis not present

## 2016-04-18 DIAGNOSIS — R0602 Shortness of breath: Secondary | ICD-10-CM | POA: Diagnosis present

## 2016-04-18 DIAGNOSIS — Z9989 Dependence on other enabling machines and devices: Secondary | ICD-10-CM

## 2016-04-18 DIAGNOSIS — Z7984 Long term (current) use of oral hypoglycemic drugs: Secondary | ICD-10-CM | POA: Diagnosis not present

## 2016-04-18 DIAGNOSIS — E119 Type 2 diabetes mellitus without complications: Secondary | ICD-10-CM | POA: Diagnosis present

## 2016-04-18 DIAGNOSIS — J441 Chronic obstructive pulmonary disease with (acute) exacerbation: Secondary | ICD-10-CM | POA: Diagnosis present

## 2016-04-18 DIAGNOSIS — Z90710 Acquired absence of both cervix and uterus: Secondary | ICD-10-CM | POA: Diagnosis not present

## 2016-04-18 DIAGNOSIS — Z79899 Other long term (current) drug therapy: Secondary | ICD-10-CM | POA: Diagnosis not present

## 2016-04-18 DIAGNOSIS — N200 Calculus of kidney: Secondary | ICD-10-CM | POA: Diagnosis present

## 2016-04-18 DIAGNOSIS — Z87891 Personal history of nicotine dependence: Secondary | ICD-10-CM | POA: Diagnosis not present

## 2016-04-18 DIAGNOSIS — Z87442 Personal history of urinary calculi: Secondary | ICD-10-CM | POA: Diagnosis not present

## 2016-04-18 LAB — BASIC METABOLIC PANEL
Anion gap: 6 (ref 5–15)
BUN: 31 mg/dL — AB (ref 6–20)
CO2: 27 mmol/L (ref 22–32)
CREATININE: 1.15 mg/dL — AB (ref 0.44–1.00)
Calcium: 8.1 mg/dL — ABNORMAL LOW (ref 8.9–10.3)
Chloride: 106 mmol/L (ref 101–111)
GFR calc Af Amer: 53 mL/min — ABNORMAL LOW (ref >60)
GFR, EST NON AFRICAN AMERICAN: 46 mL/min — AB (ref >60)
GLUCOSE: 233 mg/dL — AB (ref 65–99)
POTASSIUM: 3.4 mmol/L — AB (ref 3.5–5.1)
SODIUM: 139 mmol/L (ref 135–145)

## 2016-04-18 LAB — CBC
HEMATOCRIT: 35.7 % (ref 35.0–47.0)
Hemoglobin: 11.6 g/dL — ABNORMAL LOW (ref 12.0–16.0)
MCH: 29.7 pg (ref 26.0–34.0)
MCHC: 32.5 g/dL (ref 32.0–36.0)
MCV: 91.3 fL (ref 80.0–100.0)
PLATELETS: 146 10*3/uL — AB (ref 150–440)
RBC: 3.91 MIL/uL (ref 3.80–5.20)
RDW: 13.8 % (ref 11.5–14.5)
WBC: 17.1 10*3/uL — ABNORMAL HIGH (ref 3.6–11.0)

## 2016-04-18 LAB — URINALYSIS COMPLETE WITH MICROSCOPIC (ARMC ONLY)
Bilirubin Urine: NEGATIVE
Glucose, UA: NEGATIVE mg/dL
KETONES UR: NEGATIVE mg/dL
LEUKOCYTES UA: NEGATIVE
Nitrite: NEGATIVE
PH: 7 (ref 5.0–8.0)
PROTEIN: NEGATIVE mg/dL
RBC / HPF: NONE SEEN RBC/hpf (ref 0–5)
SPECIFIC GRAVITY, URINE: 1.009 (ref 1.005–1.030)

## 2016-04-18 LAB — GLUCOSE, CAPILLARY
GLUCOSE-CAPILLARY: 215 mg/dL — AB (ref 65–99)
Glucose-Capillary: 247 mg/dL — ABNORMAL HIGH (ref 65–99)
Glucose-Capillary: 250 mg/dL — ABNORMAL HIGH (ref 65–99)
Glucose-Capillary: 222 mg/dL — ABNORMAL HIGH (ref 65–99)
Glucose-Capillary: 312 mg/dL — ABNORMAL HIGH (ref 65–99)

## 2016-04-18 LAB — EXPECTORATED SPUTUM ASSESSMENT W REFEX TO RESP CULTURE

## 2016-04-18 LAB — HEMOGLOBIN A1C: Hgb A1c MFr Bld: 6.8 % — ABNORMAL HIGH (ref 4.0–6.0)

## 2016-04-18 LAB — EXPECTORATED SPUTUM ASSESSMENT W GRAM STAIN, RFLX TO RESP C

## 2016-04-18 LAB — LACTIC ACID, PLASMA: LACTIC ACID, VENOUS: 1.5 mmol/L (ref 0.5–2.0)

## 2016-04-18 LAB — LIPASE, BLOOD: Lipase: 1998 U/L — ABNORMAL HIGH (ref 11–51)

## 2016-04-18 LAB — TROPONIN I: Troponin I: <0.03 ng/mL (ref <0.031)

## 2016-04-18 MED ORDER — LISINOPRIL 20 MG PO TABS
40.0000 mg | ORAL_TABLET | Freq: Every day | ORAL | Status: DC
  Administered 2016-04-18 – 2016-04-19 (×2): 40 mg via ORAL
  Filled 2016-04-18: qty 2
  Filled 2016-04-18: qty 1
  Filled 2016-04-18: qty 2

## 2016-04-18 MED ORDER — ENOXAPARIN SODIUM 40 MG/0.4ML ~~LOC~~ SOLN
40.0000 mg | SUBCUTANEOUS | Status: DC
  Administered 2016-04-18 (×2): 40 mg via SUBCUTANEOUS
  Filled 2016-04-18 (×2): qty 0.4

## 2016-04-18 MED ORDER — INSULIN GLARGINE 100 UNIT/ML ~~LOC~~ SOLN
10.0000 [IU] | Freq: Every day | SUBCUTANEOUS | Status: DC
  Administered 2016-04-18: 22:00:00 10 [IU] via SUBCUTANEOUS
  Filled 2016-04-18 (×2): qty 0.1

## 2016-04-18 MED ORDER — ACETAMINOPHEN 650 MG RE SUPP: 650.0000 mg | Freq: Four times a day (QID) | RECTAL | Status: DC | PRN

## 2016-04-18 MED ORDER — INSULIN ASPART 100 UNIT/ML ~~LOC~~ SOLN
0.0000 [IU] | Freq: Three times a day (TID) | SUBCUTANEOUS | Status: DC
  Administered 2016-04-18 (×3): 3 [IU] via SUBCUTANEOUS
  Administered 2016-04-19 (×2): 2 [IU] via SUBCUTANEOUS
  Filled 2016-04-18 (×2): qty 3
  Filled 2016-04-18: qty 2
  Filled 2016-04-18: qty 3
  Filled 2016-04-18: qty 2
  Filled 2016-04-18: qty 4
  Filled 2016-04-18: qty 2

## 2016-04-18 MED ORDER — MONTELUKAST SODIUM 10 MG PO TABS
10.0000 mg | ORAL_TABLET | Freq: Every day | ORAL | Status: DC
  Administered 2016-04-18 (×2): 10 mg via ORAL
  Filled 2016-04-18 (×2): qty 1

## 2016-04-18 MED ORDER — IPRATROPIUM-ALBUTEROL 0.5-2.5 (3) MG/3ML IN SOLN
3.0000 mL | RESPIRATORY_TRACT | Status: DC | PRN
Start: 2016-04-18 — End: 2016-04-19
  Administered 2016-04-18: 3 mL via RESPIRATORY_TRACT
  Filled 2016-04-18: qty 3

## 2016-04-18 MED ORDER — ACETAMINOPHEN 325 MG PO TABS: 650.0000 mg | ORAL_TABLET | Freq: Four times a day (QID) | ORAL | Status: DC | PRN

## 2016-04-18 MED ORDER — TIOTROPIUM BROMIDE MONOHYDRATE 18 MCG IN CAPS
18.0000 ug | ORAL_CAPSULE | Freq: Every day | RESPIRATORY_TRACT | Status: DC
  Administered 2016-04-18 – 2016-04-19 (×2): 18 ug via RESPIRATORY_TRACT
  Filled 2016-04-18: qty 5

## 2016-04-18 MED ORDER — FUROSEMIDE 20 MG PO TABS
20.0000 mg | ORAL_TABLET | Freq: Every day | ORAL | Status: DC
  Administered 2016-04-18 – 2016-04-19 (×2): 20 mg via ORAL
  Filled 2016-04-18 (×2): qty 1

## 2016-04-18 MED ORDER — VANCOMYCIN HCL 10 G IV SOLR
1250.0000 mg | INTRAVENOUS | Status: DC
  Administered 2016-04-18 – 2016-04-19 (×2): 1250 mg via INTRAVENOUS
  Filled 2016-04-18 (×3): qty 1250

## 2016-04-18 MED ORDER — PIPERACILLIN-TAZOBACTAM 4.5 G IVPB
4.5000 g | Freq: Three times a day (TID) | INTRAVENOUS | Status: DC
  Administered 2016-04-18 – 2016-04-19 (×4): 4.5 g via INTRAVENOUS
  Filled 2016-04-18 (×6): qty 100

## 2016-04-18 MED ORDER — SODIUM CHLORIDE 0.9% FLUSH
3.0000 mL | Freq: Two times a day (BID) | INTRAVENOUS | Status: DC
  Administered 2016-04-18 (×3): 3 mL via INTRAVENOUS

## 2016-04-18 MED ORDER — MOMETASONE FURO-FORMOTEROL FUM 200-5 MCG/ACT IN AERO
2.0000 | INHALATION_SPRAY | Freq: Two times a day (BID) | RESPIRATORY_TRACT | Status: DC
  Administered 2016-04-18 – 2016-04-19 (×3): 2 via RESPIRATORY_TRACT
  Filled 2016-04-18: qty 8.8

## 2016-04-18 MED ORDER — AMLODIPINE BESYLATE 5 MG PO TABS
2.5000 mg | ORAL_TABLET | Freq: Every day | ORAL | Status: DC
  Administered 2016-04-18 – 2016-04-19 (×2): 2.5 mg via ORAL
  Filled 2016-04-18 (×2): qty 1

## 2016-04-18 MED ORDER — INSULIN ASPART 100 UNIT/ML ~~LOC~~ SOLN
0.0000 [IU] | Freq: Every day | SUBCUTANEOUS | Status: DC
  Administered 2016-04-18: 4 [IU] via SUBCUTANEOUS
  Administered 2016-04-18: 03:00:00 2 [IU] via SUBCUTANEOUS

## 2016-04-18 MED ORDER — ONDANSETRON HCL 4 MG PO TABS: 4.0000 mg | ORAL_TABLET | Freq: Four times a day (QID) | ORAL | Status: DC | PRN

## 2016-04-18 MED ORDER — ONDANSETRON HCL 4 MG/2ML IJ SOLN: 4.0000 mg | Freq: Four times a day (QID) | INTRAMUSCULAR | Status: DC | PRN

## 2016-04-18 MED ORDER — METHYLPREDNISOLONE SODIUM SUCC 40 MG IJ SOLR
40.0000 mg | Freq: Every day | INTRAMUSCULAR | Status: DC
  Administered 2016-04-18 – 2016-04-19 (×2): 40 mg via INTRAVENOUS
  Filled 2016-04-18 (×2): qty 1

## 2016-04-18 MED ORDER — OXYCODONE HCL 5 MG PO TABS
5.0000 mg | ORAL_TABLET | Freq: Three times a day (TID) | ORAL | Status: DC | PRN
  Administered 2016-04-18 – 2016-04-19 (×2): 5 mg via ORAL
  Filled 2016-04-18 (×2): qty 1

## 2016-04-18 MED ORDER — CLONAZEPAM 0.5 MG PO TABS: 0.5000 mg | ORAL_TABLET | Freq: Every day | ORAL | Status: DC

## 2016-04-18 MED ORDER — GABAPENTIN 400 MG PO CAPS
800.0000 mg | ORAL_CAPSULE | Freq: Three times a day (TID) | ORAL | Status: DC
  Administered 2016-04-18 – 2016-04-19 (×4): 800 mg via ORAL
  Filled 2016-04-18 (×4): qty 2

## 2016-04-18 MED ORDER — POTASSIUM CHLORIDE CRYS ER 20 MEQ PO TBCR
40.0000 meq | EXTENDED_RELEASE_TABLET | Freq: Once | ORAL | Status: AC
  Administered 2016-04-18: 13:00:00 40 meq via ORAL
  Filled 2016-04-18: qty 2

## 2016-04-18 MED ORDER — CARVEDILOL 12.5 MG PO TABS
25.0000 mg | ORAL_TABLET | Freq: Two times a day (BID) | ORAL | Status: DC
  Administered 2016-04-18 – 2016-04-19 (×4): 25 mg via ORAL
  Filled 2016-04-18 (×4): qty 2

## 2016-04-18 NOTE — Evaluation (Signed)
Physical Therapy Evaluation Patient Details Name: Megan Bolton MRN: [id number] DOB: [birth year] Today's Date: 04/18/2016   History of Present Illness  Megan Bolton is a 75 y.o. female with a known history of diabetes mellitus, hypertension, COPD, hyperlipidemia presented to the emergency room with difficulty breathing.  Clinical Impression  Pt does well with mobility and transfers and though she is safe with ambulation she does have fatigue and needs to take 2 breaks during 250 ft of ambulation. Pt otherwise is safe and is eager to go home, she will not require further PT intervention.     Follow Up Recommendations No PT follow up    Equipment Recommendations       Recommendations for Other Services       Precautions / Restrictions Precautions Precautions: Fall Restrictions Weight Bearing Restrictions: No      Mobility  Bed Mobility Overal bed mobility: Independent                Transfers Overall transfer level: Independent Equipment used: Rolling walker (2 wheeled)             General transfer comment: Pt able to get up to standing w/o any issues or hesitancy.  Ambulation/Gait Ambulation/Gait assistance: Modified independent (Device/Increase time) Ambulation Distance (Feet): 250 Feet Assistive device: Rolling walker (2 wheeled)       General Gait Details: Pt does need 2 standing rest breaks stating: "I don't usually do this much walking."  She has no safety issues and (on 3 liters) her O2 remains in the 90s and her HR stays below 100.  She walks with good confidence and safety.  Stairs Stairs: Yes Stairs assistance: Modified independent (Device/Increase time) Stair Management: One rail Right Number of Stairs: 4 General stair comments: Pt able to negotiate up/down steps w/o issue.   Wheelchair Mobility    Modified Rankin (Stroke Patients Only)       Balance                                             Pertinent  Vitals/Pain Pain Assessment: No/denies pain    Home Living Family/patient expects to be discharged to:: Private residence Living Arrangements: Children   Type of Home: House Home Access: Stairs to enter Entrance Stairs-Rails: Right Entrance Stairs-Number of Steps: 2 Home Layout: One level        Prior Function Level of Independence: Independent with assistive device(s)         Comments: Household ambulation; gets out to church if feeling up to it that day.     Hand Dominance        Extremity/Trunk Assessment   Upper Extremity Assessment: Overall WFL for tasks assessed           Lower Extremity Assessment: Overall WFL for tasks assessed         Communication   Communication: No difficulties  Cognition Arousal/Alertness: Awake/alert Behavior During Therapy: WFL for tasks assessed/performed Overall Cognitive Status: Within Functional Limits for tasks assessed                      General Comments      Exercises        Assessment/Plan    PT Assessment Patent does not need any further PT services  PT Diagnosis Difficulty walking;Generalized weakness   PT Problem List    PT Treatment  Interventions     PT Goals (Current goals can be found in the Care Plan section) Acute Rehab PT Goals Patient Stated Goal: Go home PT Goal Formulation: With patient    Frequency     Barriers to discharge        Co-evaluation               End of Session Equipment Utilized During Treatment: Gait belt;Oxygen (2 liters in room, 3 while walking) Activity Tolerance: Patient limited by fatigue Patient left: with bed alarm set;with call bell/phone within reach;with family/visitor present           Time: [id number] PT Time Calculation (min) (ACUTE ONLY): 16 min   Charges:   PT Evaluation $PT Eval Low Complexity: 1 Procedure     PT G Codes:       Wayne Both, PT, DPT [phone]  Kreg Shropshire 04/18/2016, 4:25 PM

## 2016-04-18 NOTE — ED Notes (Signed)
Pt transported to room 1C-101

## 2016-04-18 NOTE — Consult Note (Signed)
@ENCDATE @ 10:10 AM   Megan Bolton 1941/06/27 [id number]  Referring provider: Dr. Claria Dice  Chief Complaint  Patient presents with  . Shortness of Breath    HPI: The patient is a 75 year old female with a past medical history of nephrolithiasis and recurrent urinary tract infections presented to the hospital with shortness of breath. She is followed by Dr. Edrick Oh at [facility] urology for recurrent urinary tract infection. Yesterday he started her on Macrobid and obtain a KUB. The KUB showed a 1.5 cm left renal calculus. I am unable to review these images. However, after starting the Macrobid the patient developed shortness of breath and was admitted to the hospital with sepsis and a COPD exacerbation. Urology was consulted given her recurrent urinary tract infection and new diagnosis of left renal calculus. At this time, the patient's only complaint is shortness of breath. She denies any current chills. She denies any flank pain. She has had has passed stones in the past and required ureteroscopy. She does not feel that she has symptoms that are similar to previous passage of stones. She denies dysuria at this time. Urinalysis at admission was negative but she was on antibiotics. Also of note, last time she started Bactrim she developed shortness of breath and was admitted to the hospital.   PMH: Past Medical History  Diagnosis Date  . Diabetes mellitus without complication ([place])   . Hypertension   . COPD (chronic obstructive pulmonary disease) ([place])   . Hyperlipidemia     Surgical History: Past Surgical History  Procedure Laterality Date  . Abdominal hysterectomy    . Spine surgery      Home Medications:  Scheduled Meds: . amLODipine  2.5 mg Oral Daily  . carvedilol  25 mg Oral BID  . [START ON 04/19/2016] clonazePAM  0.5 mg Oral QHS  . enoxaparin (LOVENOX) injection  40 mg Subcutaneous Q24H  . furosemide  20 mg Oral Daily  . gabapentin  800 mg Oral TID  . insulin  aspart  0-5 Units Subcutaneous QHS  . insulin aspart  0-9 Units Subcutaneous TID WC  . lisinopril  40 mg Oral Daily  . mometasone-formoterol  2 puff Inhalation BID  . montelukast  10 mg Oral QHS  . piperacillin-tazobactam (ZOSYN)  IV  4.5 g Intravenous Q8H  . sodium chloride flush  3 mL Intravenous Q12H  . tiotropium  18 mcg Inhalation Daily  . vancomycin  1,250 mg Intravenous Q18H   Continuous Infusions:  PRN Meds:.acetaminophen **OR** acetaminophen, ipratropium-albuterol, ondansetron **OR** ondansetron (ZOFRAN) IV, oxyCODONE   Allergies: No Known Allergies  Family History: Family History  Problem Relation Age of Onset  . Hypertension Paternal Grandfather     Social History:  reports that she quit smoking about a year ago. She has never used smokeless tobacco. She reports that she does not drink alcohol or use illicit drugs.  ROS: 12 point ROS negative except per HPI                                        Physical Exam: BP 163/67 mmHg  Pulse 105  Temp(Src) 100 F (37.8 C) (Oral)  Resp 18  Ht 5\' 9"  (1.753 m)  Wt 248 lb (112.492 kg)  BMI 36.61 kg/m2  SpO2 93%  Constitutional:  Alert and oriented, No acute distress. HEENT: [place] AT, moist mucus membranes.  Trachea midline, no masses. Cardiovascular:  No clubbing, cyanosis, or edema. Respiratory: Normal respiratory effort, no increased work of breathing. GI: Abdomen is soft, nontender, nondistended, no abdominal masses GU: No CVA tenderness.  Skin: No rashes, bruises or suspicious lesions. Lymph: No cervical or inguinal adenopathy. Neurologic: Grossly intact, no focal deficits, moving all 4 extremities. Psychiatric: Normal mood and affect.  Laboratory Data: Lab Results  Component Value Date   WBC 17.1* 04/18/2016   HGB 11.6* 04/18/2016   HCT 35.7 04/18/2016   MCV 91.3 04/18/2016   PLT 146* 04/18/2016    Lab Results  Component Value Date   CREATININE 1.15* 04/18/2016    No results found  for: PSA  No results found for: TESTOSTERONE  Lab Results  Component Value Date   HGBA1C 7.1* 02/28/2016    Urinalysis    Component Value Date/Time   COLORURINE YELLOW* 04/17/2016 2323   COLORURINE Yellow 09/19/2014 1157   APPEARANCEUR CLEAR* 04/17/2016 2323   APPEARANCEUR Cloudy 09/19/2014 1157   LABSPEC 1.009 04/17/2016 2323   LABSPEC 1.017 09/19/2014 1157   PHURINE 7.0 04/17/2016 2323   PHURINE 7.0 09/19/2014 1157   GLUCOSEU NEGATIVE 04/17/2016 2323   GLUCOSEU Negative 09/19/2014 1157   HGBUR 1+* 04/17/2016 2323   HGBUR Negative 09/19/2014 1157   BILIRUBINUR NEGATIVE 04/17/2016 2323   BILIRUBINUR negative 04/03/2016 1408   BILIRUBINUR Negative 09/19/2014 [place] 04/17/2016 2323   KETONESUR Negative 09/19/2014 1157   PROTEINUR NEGATIVE 04/17/2016 2323   PROTEINUR negative 04/03/2016 1408   PROTEINUR Negative 09/19/2014 1157   UROBILINOGEN negative 04/03/2016 1408   NITRITE NEGATIVE 04/17/2016 2323   NITRITE trace 04/03/2016 1408   NITRITE Positive 09/19/2014 1157   LEUKOCYTESUR NEGATIVE 04/17/2016 2323   LEUKOCYTESUR Negative 09/19/2014 1157    Pertinent Imaging: 1/2 cm left renal stone on KUB. I'm unable to review these images as they were performed at [facility].  Assessment & Plan:    1. COPD exacerbation 2. UTI 3. 1.5 cm left renal stone I believe the patient's symptoms and sepsis are related to her COPD exacerbation. I would continue broad-spectrum antibiotics pending culture and sensitivity results. She does have a left renal stone but has no signs of ureteral obstruction on exam. We will order a renal ultrasound to ensure that there is no hydronephrosis. If this is normal, there is no further urological intervention indicated at this time. She can follow up with Dr. Jacqlyn Larsen at [facility] as an outpatient to discuss definitive stone management.   Nickie Retort, MD  [facility] [address] [place], [place] [address] [phone]

## 2016-04-18 NOTE — ED Notes (Signed)
Critical lactic called from lab. Dr. Karma Greaser notified by [facility], rn.

## 2016-04-18 NOTE — Progress Notes (Signed)
Pharmacy Antibiotic Note  Megan Bolton is a 75 y.o. female admitted on 04/17/2016 with sepsis.  Pharmacy has been consulted for vancomycin and Zosyn dosing.  Plan: DW 84 kg  Vd 59L kei 0.049 hr-1  T1/2 14 hours Vancomycin 1250 mg q 18 hours ordered with stacked dosing. Level before 5th dose. Goal trough 15-20.  Zosyn 4.5 grams q 8 hours ordered for Pseudomonas risk of recent abx usage as outpatient.  Height: 5\' 9"  (175.3 cm) Weight: 245 lb (111.131 kg) IBW/kg (Calculated) : 66.2  Temp (24hrs), Avg:101 F (38.3 C), Min:100 F (37.8 C), Max:102.9 F (39.4 C)   Recent Labs Lab 04/17/16 2322 04/17/16 2323  WBC 18.9*  --   CREATININE 1.21*  --   LATICACIDVEN  --  2.1*    Estimated Creatinine Clearance: 54.2 mL/min (by C-G formula based on Cr of 1.21).    No Known Allergies  Antimicrobials this admission: vancomycin Zosyn >>    >>   Dose adjustments this admission:   Microbiology results: 4/21 BCx: pending 4/21 UCx: pending  4/21 Sputum Cx: pending   4/21 CXR: no acute disease 4/21 UA: (-)  Thank you for allowing pharmacy to be a part of this patient's care.  Nasser Ku S 04/18/2016 2:45 AM

## 2016-04-18 NOTE — ED Notes (Signed)
Explanation of treatment process provided to pt who verbalizes understanding.

## 2016-04-18 NOTE — H&P (Signed)
[place] [facility] [place] NAME: Megan Bolton    MR#:  [id number]  DATE OF BIRTH:  07-15-1941  DATE OF ADMISSION:  04/17/2016  PRIMARY CARE PHYSICIAN: Dicky Doe, MD   REQUESTING/REFERRING PHYSICIAN: Karma Greaser, MD  CHIEF COMPLAINT:   Chief Complaint  Patient presents with  . Shortness of Breath    HISTORY OF PRESENT ILLNESS:  Megan Bolton  is a 75 y.o. female who presents with acute episode of shortness of breath. Patient states that she was just seen by her urologist today and started on Macrobid for possible UTI. Patient states that she has been battling UTIs for the past 3 months or so. Her urologist is at [facility], and told her today they're concerned she might have a renal stone. On chart review x-ray done at her urologist office today shows a left 1.5 cm renal stone. However, the patient states that she feels her shortness of breath comes from taking the [place], as she feels that this is happened in the past when she's taking this medication. She denies any episodes of wheezing, but does state that she began to feel better when she got nebulizer treatment per EMS, and then again in the ED. On presentation the ED she was febrile to 102, tachycardic, and found to have an elevated white blood cell count of 18. Chest x-ray did not show any pneumonia, UA looked clean. Pancultures were sent 90 antibiotics started, and hospitalists were called for admission and further workup.  PAST MEDICAL HISTORY:   Past Medical History  Diagnosis Date  . Diabetes mellitus without complication ([place])   . Hypertension   . COPD (chronic obstructive pulmonary disease) ([place])   . Hyperlipidemia     PAST SURGICAL HISTORY:   Past Surgical History  Procedure Laterality Date  . Abdominal hysterectomy    . Spine surgery      SOCIAL HISTORY:   Social History  Substance Use Topics  . Smoking status: Former Smoker -- 0.50 packs/day    Quit date:  04/28/2015  . Smokeless tobacco: Never Used  . Alcohol Use: No    FAMILY HISTORY:   Family History  Problem Relation Age of Onset  . Hypertension Paternal Grandfather     DRUG ALLERGIES:  No Known Allergies  MEDICATIONS AT HOME:   Prior to Admission medications   Medication Sig Start Date End Date Taking? Authorizing Provider  ADVAIR DISKUS 250-50 MCG/DOSE AEPB INHALE 1 PUFF TWICE DAILY RINSE MOUTH AFTER EACH USE 07/19/15  Yes Arlis Porta., MD  albuterol (PROVENTIL) (2.5 MG/3ML) 0.083% nebulizer solution Take 3 mLs (2.5 mg total) by nebulization every 6 (six) hours as needed for wheezing. 03/01/16  Yes Loletha Grayer, MD  albuterol (VENTOLIN HFA) 108 (90 Base) MCG/ACT inhaler Inhale 2 puffs into the lungs every 6 (six) hours as needed for wheezing or shortness of breath. 01/30/16  Yes Arlis Porta., MD  amLODipine (NORVASC) 2.5 MG tablet Take 2.5 mg by mouth daily.  02/19/16  Yes Historical Provider, MD  Blood Glucose Monitoring Suppl (ACCU-CHEK AVIVA CONNECT) w/Device KIT 1 kit by Other route 2 (two) times daily. 03/11/16  Yes Arlis Porta., MD  carvedilol (COREG) 25 MG tablet TAKE 1 TABLET BY MOUTH TWICE DAILY 11/20/15  Yes Arlis Porta., MD  clonazePAM (KLONOPIN) 0.5 MG tablet 0.5 mg at bedtime.  07/04/15  Yes Historical Provider, MD  furosemide (LASIX) 20 MG tablet Take 1 tablet (20 mg  total) by mouth daily. 01/21/16  Yes Arlis Porta., MD  gabapentin (NEURONTIN) 800 MG tablet Take 1 tablet three times a day. 02/25/16  Yes Arlis Porta., MD  indapamide (LOZOL) 1.25 MG tablet TAKE ONE TABLET BY MOUTH EVERY DAY 11/20/15  Yes Arlis Porta., MD  lisinopril (PRINIVIL,ZESTRIL) 40 MG tablet TAKE ONE (1) TABLET BY MOUTH EVERY DAY 09/27/15  Yes Arlis Porta., MD  metFORMIN (GLUCOPHAGE) 500 MG tablet Take 500 mg by mouth daily with breakfast.  05/22/15  Yes Historical Provider, MD  montelukast (SINGULAIR) 10 MG tablet Take 1 tablet (10 mg total) by mouth  at bedtime. 03/26/16  Yes Arlis Porta., MD  nitrofurantoin, macrocrystal-monohydrate, (MACROBID) 100 MG capsule Take 100 mg by mouth daily.   Yes Historical Provider, MD  oxyCODONE (OXY IR/ROXICODONE) 5 MG immediate release tablet Take 5 mg by mouth 3 (three) times daily.  02/17/16  Yes Historical Provider, MD  OXYGEN Inhale 2 L into the lungs continuous.   Yes Historical Provider, MD  potassium chloride (K-DUR) 10 MEQ tablet Take 10 mEq by mouth 2 (two) times daily.   Yes Historical Provider, MD  SPIRIVA HANDIHALER 18 MCG inhalation capsule ONE CAPSULE INHALED VIA UNIT ONCE EVERY 24 HOURS 11/20/15  Yes Arlis Porta., MD  valACYclovir (VALTREX) 1000 MG tablet Take 1 tablet (1,000 mg total) by mouth daily. Patient taking differently: Take 1,000 mg by mouth daily as needed (for fever blisters).  03/26/16  Yes Arlis Porta., MD    REVIEW OF SYSTEMS:  Review of Systems  Constitutional: Positive for fever. Negative for chills, weight loss and malaise/fatigue.  HENT: Negative for ear pain, hearing loss and tinnitus.   Eyes: Negative for blurred vision, double vision, pain and redness.  Respiratory: Positive for shortness of breath. Negative for cough and hemoptysis.   Cardiovascular: Negative for chest pain, palpitations, orthopnea and leg swelling.  Gastrointestinal: Negative for nausea, vomiting, abdominal pain, diarrhea and constipation.  Genitourinary: Positive for dysuria. Negative for frequency and hematuria.  Musculoskeletal: Negative for back pain, joint pain and neck pain.  Skin:       No acne, rash, or lesions  Neurological: Negative for dizziness, tremors, focal weakness and weakness.  Endo/Heme/Allergies: Negative for polydipsia. Does not bruise/bleed easily.  Psychiatric/Behavioral: Negative for depression. The patient is not nervous/anxious and does not have insomnia.      VITAL SIGNS:   Filed Vitals:   04/17/16 2313 04/17/16 2345 04/18/16 0000 04/18/16 0015   BP: 136/73  153/66   Pulse:  105 100 107  Temp:      TempSrc:      Resp:  _0 Height:      Weight:      SpO2:  95% 99% 97%   Wt Readings from Last 3 Encounters:  04/17/16 111.131 kg (245 lb)  04/03/16 109.77 kg (242 lb)  03/09/16 111.676 kg (246 lb 3.2 oz)    PHYSICAL EXAMINATION:  Physical Exam  Vitals reviewed. Constitutional: She is oriented to person, place, and time. She appears well-developed and well-nourished. No distress.  HENT:  Head: Normocephalic and atraumatic.  Mouth/Throat: Oropharynx is clear and moist.  Eyes: Conjunctivae and EOM are normal. Pupils are equal, round, and reactive to light. No scleral icterus.  Neck: Normal range of motion. Neck supple. No JVD present. No thyromegaly present.  Cardiovascular: Normal rate, regular rhythm and intact distal pulses.  Exam reveals no gallop and no  friction rub.   No murmur heard. Respiratory: Breath sounds normal. No respiratory distress. She has no wheezes. She has no rales.  GI: Soft. Bowel sounds are normal. She exhibits no distension. There is no tenderness.  Musculoskeletal: Normal range of motion. She exhibits no edema.  No arthritis, no gout  Lymphadenopathy:    She has no cervical adenopathy.  Neurological: She is alert and oriented to person, place, and time. No cranial nerve deficit.  No dysarthria, no aphasia  Skin: Skin is warm and dry. No rash noted. No erythema.  Psychiatric: She has a normal mood and affect. Her behavior is normal. Judgment and thought content normal.    LABORATORY PANEL:   CBC  Recent Labs Lab 04/17/16 2322  WBC 18.9*  HGB 12.4  HCT 38.1  PLT 170   ------------------------------------------------------------------------------------------------------------------  Chemistries   Recent Labs Lab 04/17/16 2322  NA 139  K 3.7  CL 103  CO2 29  GLUCOSE 222*  BUN 32*  CREATININE 1.21*  CALCIUM 8.9  AST 101*  ALT 39  ALKPHOS 61  BILITOT 0.9    ------------------------------------------------------------------------------------------------------------------  Cardiac Enzymes  Recent Labs Lab 04/17/16 2322  TROPONINI <0.03   ------------------------------------------------------------------------------------------------------------------  RADIOLOGY:  Dg Chest 2 View  04/17/2016  CLINICAL DATA:  Initial evaluation for acute shortness of breath. EXAM: CHEST  2 VIEW COMPARISON:  Prior study 02/28/2016. FINDINGS: Mild cardiomegaly is stable. Atheromatous plaque within the aortic arch. Mediastinal silhouette within normal limits. Trach air column midline and patent. Lungs are normally inflated. Changes related to COPD noted. No consolidative airspace disease. No pulmonary edema. No large pleural effusion. No pneumothorax. No acute osseous abnormality. IMPRESSION: 1. No active cardiopulmonary disease. 2. COPD. Electronically Signed   By: Jeannine Boga M.D.   On: 04/17/2016 23:47    EKG:   Orders placed or performed during the hospital encounter of 04/17/16  . EKG 12-Lead  . EKG 12-Lead  . EKG 12-Lead  . EKG 12-Lead    IMPRESSION AND PLAN:  Principal Problem:   Sepsis ([place]) - she presented with fever, tachycardia, leukocytosis with bandemia, and elevated lactic acid. She is hemodynamically stable, IV antibiotics were started in the ED, and continued on admission. IV fluids started. Blood and urine cultures were sent from the ED, we'll add a sputum culture. Continue checking lactic acid serially until normal. Active Problems:   COPD exacerbation ([place]) - we'll continue her home inhalers, question whether or not Macrobid may be playing a role here as this is second time the patient states that she has gotten short of breath after taking the medication. Do not feel the need for IV steroids at this time she is not actively wheezing. We'll keep her on her home level of oxygen, with when necessary DuoNeb's.   UTI (lower urinary  tract infection) - not indicated by UA, however question potentially sterilized UA with recurrent UTI treatments over the last several months. Also some concern possibly for an infected stone, this was also related to the patient by her urologist today and imaging are found a left 1.5 cm stone. Consult urology. As above.   Type 2 diabetes mellitus (HCC) - sliding scale insulin with corresponding glucose checks and carb modified diet   HTN (hypertension) - continue home meds   OSA on CPAP - CPAP daily at bedtime with home settings  All the records are reviewed and case discussed with ED provider. Management plans discussed with the patient and/or family.  DVT PROPHYLAXIS: SubQ lovenox  GI PROPHYLAXIS: None  ADMISSION STATUS: Inpatient  CODE STATUS: Full Code Status History    Date Active Date Inactive Code Status Order ID Comments User Context   02/28/2016  5:40 AM 03/01/2016  5:39 PM Full Code [id number]  Saundra Shelling, MD Inpatient      TOTAL TIME TAKING CARE OF THIS PATIENT: 45 minutes.    Wymon Swaney FIELDING 04/18/2016, 12:59 AM  [facility]  Office  [phone]  CC: Primary care physician; Dicky Doe, MD

## 2016-04-18 NOTE — Progress Notes (Signed)
[place] at [place] NAME: Megan Bolton    MR#:  [id number]  DATE OF BIRTH:  Jul 09, 1941  SUBJECTIVE:  CHIEF COMPLAINT:   Chief Complaint  Patient presents with  . Shortness of Breath   -Patient with past medical history significant for recurrent renal stones admitted for sepsis. -Started on [place] for UTI by outpatient urologist -Still has low-grade fevers. Cultures are pending at this time. Chest x-ray negative. Breathing has improved. Chronically on 2 L oxygen.  REVIEW OF SYSTEMS:  Review of Systems  Constitutional: Positive for chills and malaise/fatigue. Negative for fever.  HENT: Negative for ear discharge, ear pain and nosebleeds.   Eyes: Negative for blurred vision and double vision.  Respiratory: Positive for cough, shortness of breath and wheezing.   Cardiovascular: Negative for chest pain, palpitations and leg swelling.  Gastrointestinal: Negative for nausea, vomiting, abdominal pain, diarrhea and constipation.  Genitourinary: Negative for dysuria, urgency, frequency and flank pain.  Musculoskeletal: Negative for myalgias.  Neurological: Negative for dizziness, tremors, sensory change, seizures and headaches.  Psychiatric/Behavioral: Negative for depression.    DRUG ALLERGIES:  No Known Allergies  VITALS:  Blood pressure 163/67, pulse 105, temperature 100 F (37.8 C), temperature source Oral, resp. rate 18, height 5\' 9"  (1.753 m), weight 112.492 kg (248 lb), SpO2 93 %.  PHYSICAL EXAMINATION:  Physical Exam  GENERAL:  75 y.o.-year-old obese patient lying in the bed with no acute distress. Feels weak. EYES: Pupils equal, round, reactive to light and accommodation. No scleral icterus. Extraocular muscles intact.  HEENT: Head atraumatic, normocephalic. Oropharynx and nasopharynx clear.  NECK:  Supple, no jugular venous distention. No thyroid enlargement, no tenderness.  LUNGS: Normal breath sounds bilaterally, no  wheezing, rales,rhonchi or crepitation. No use of accessory muscles of respiration. Decreased bibasilar breath sounds CARDIOVASCULAR: S1, S2 normal. No murmurs, rubs, or gallops.  ABDOMEN: Soft, nontender, nondistended. Bowel sounds present. No organomegaly or mass.  EXTREMITIES: No pedal edema, cyanosis, or clubbing.  NEUROLOGIC: Cranial nerves II through XII are intact. Muscle strength 5/5 in all extremities. Sensation intact. Gait not checked. Global weakness PSYCHIATRIC: The patient is alert and oriented x 3.  SKIN: No obvious rash, lesion, or ulcer.    LABORATORY PANEL:   CBC  Recent Labs Lab 04/18/16 0230  WBC 17.1*  HGB 11.6*  HCT 35.7  PLT 146*   ------------------------------------------------------------------------------------------------------------------  Chemistries   Recent Labs Lab 04/17/16 2322 04/18/16 0230  NA 139 139  K 3.7 3.4*  CL 103 106  CO2 29 27  GLUCOSE 222* 233*  BUN 32* 31*  CREATININE 1.21* 1.15*  CALCIUM 8.9 8.1*  AST 101*  --   ALT 39  --   ALKPHOS 61  --   BILITOT 0.9  --    ------------------------------------------------------------------------------------------------------------------  Cardiac Enzymes  Recent Labs Lab 04/17/16 2322  TROPONINI <0.03   ------------------------------------------------------------------------------------------------------------------  RADIOLOGY:  Dg Chest 2 View  04/17/2016  CLINICAL DATA:  Initial evaluation for acute shortness of breath. EXAM: CHEST  2 VIEW COMPARISON:  Prior study 02/28/2016. FINDINGS: Mild cardiomegaly is stable. Atheromatous plaque within the aortic arch. Mediastinal silhouette within normal limits. Trach air column midline and patent. Lungs are normally inflated. Changes related to COPD noted. No consolidative airspace disease. No pulmonary edema. No large pleural effusion. No pneumothorax. No acute osseous abnormality. IMPRESSION: 1. No active cardiopulmonary disease. 2.  COPD. Electronically Signed   By: Jeannine Boga M.D.   On: 04/17/2016 23:47  EKG:   Orders placed or performed during the hospital encounter of 04/17/16  . EKG 12-Lead  . EKG 12-Lead  . EKG 12-Lead  . EKG 12-Lead    ASSESSMENT AND PLAN:   75y/oF with PMH of recurrent renal stones and UTI, DM, HTN, COPD and hyperlipidemia presents with sepsis  1. Sepsis- actually from UTI. Urine was nitrite and leuk esterase positive at urologist's office. Urine sterile here because she was on antibiotics. - Still has low-grade fevers. Chest x-ray negative for any infiltrate. -Wbc's elevated as well. Follow blood and urine cultures. -Continue vancomycin and Zosyn for now.  2. COPD exacerbation-started on daily steroids. -Continue home medications, inhalers and 1 labs.  3. Renal stone-1.5 cm left-sided renal stone noted on outpatient CT. -Appreciate urology consult. Patient denies any flank pain. Renal function is normal. -Check renal ultrasound to rule out obstruction and hydronephrosis. If negative, can follow up with urologist as outpatient.  4. Hypokalemia-being replaced  5. Hypertension-on Norvasc, lisinopril and Coreg  6. Diabetes mellitus-metformin on hold. On sliding scale insulin. -Sugars are elevated, will start low-dose Lantus. -Follow up A1c  7. DVT Prophylaxis- on Lovenox  Physical therapy tomorrow   All the records are reviewed and case discussed with Care Management/Social Workerr. Management plans discussed with the patient, family and they are in agreement.  CODE STATUS: full code  TOTAL TIME TAKING CARE OF THIS PATIENT: 38 minutes.   POSSIBLE D/C IN 1-2 DAYS, DEPENDING ON CLINICAL CONDITION.   Gladstone Lighter M.D on 04/18/2016 at 11:36 AM  Between 7am to 6pm - Pager - [phone]  After 6pm go to [email or web address] - password [email or web address] [facility]  Office  [phone]  CC: Primary care physician; Dicky Doe, MD

## 2016-04-19 LAB — CBC
HEMATOCRIT: 35.3 % (ref 35.0–47.0)
Hemoglobin: 11.5 g/dL — ABNORMAL LOW (ref 12.0–16.0)
MCH: 29.5 pg (ref 26.0–34.0)
MCHC: 32.4 g/dL (ref 32.0–36.0)
MCV: 91 fL (ref 80.0–100.0)
Platelets: 154 10*3/uL (ref 150–440)
RBC: 3.88 MIL/uL (ref 3.80–5.20)
RDW: 13.5 % (ref 11.5–14.5)
WBC: 16.7 10*3/uL — ABNORMAL HIGH (ref 3.6–11.0)

## 2016-04-19 LAB — GLUCOSE, CAPILLARY
GLUCOSE-CAPILLARY: 169 mg/dL — AB (ref 65–99)
Glucose-Capillary: 160 mg/dL — ABNORMAL HIGH (ref 65–99)

## 2016-04-19 LAB — BASIC METABOLIC PANEL
ANION GAP: 7 (ref 5–15)
BUN: 27 mg/dL — ABNORMAL HIGH (ref 6–20)
CALCIUM: 8.9 mg/dL (ref 8.9–10.3)
CO2: 25 mmol/L (ref 22–32)
CREATININE: 1.1 mg/dL — AB (ref 0.44–1.00)
Chloride: 109 mmol/L (ref 101–111)
GFR calc Af Amer: 56 mL/min — ABNORMAL LOW (ref >60)
GFR calc non Af Amer: 48 mL/min — ABNORMAL LOW (ref >60)
GLUCOSE: 190 mg/dL — AB (ref 65–99)
Potassium: 4 mmol/L (ref 3.5–5.1)
Sodium: 141 mmol/L (ref 135–145)

## 2016-04-19 MED ORDER — FLUTICASONE-SALMETEROL 250-50 MCG/DOSE IN AEPB: 1.0000 | INHALATION_SPRAY | Freq: Two times a day (BID) | RESPIRATORY_TRACT | Status: DC

## 2016-04-19 MED ORDER — LEVOFLOXACIN 500 MG PO TABS: 500.0000 mg | ORAL_TABLET | Freq: Every day | ORAL | Status: DC

## 2016-04-19 MED ORDER — VALACYCLOVIR HCL 1 G PO TABS: 1000.0000 mg | ORAL_TABLET | Freq: Every day | ORAL | Status: AC | PRN

## 2016-04-19 MED ORDER — PREDNISONE 10 MG (21) PO TBPK: 10.0000 mg | ORAL_TABLET | Freq: Every day | ORAL | Status: DC

## 2016-04-19 NOTE — Discharge Summary (Signed)
[facility] NAME: Megan Bolton    MR#:  [id number]  DATE OF BIRTH:  [birth year]  DATE OF ADMISSION:  04/17/2016 ADMITTING PHYSICIAN: Lance Coon, MD  DATE OF DISCHARGE: 04/19/2016  PRIMARY CARE PHYSICIAN: Dicky Doe, MD    ADMISSION DIAGNOSIS:  Shortness of breath [R06.02] Sepsis, due to unspecified organism ([place]) [A41.9] Fever, unspecified fever cause [R50.9]  DISCHARGE DIAGNOSIS:  Principal Problem:   Sepsis ([place]) Active Problems:   COPD exacerbation ([place])   UTI (lower urinary tract infection)   Type 2 diabetes mellitus (HCC)   HTN (hypertension)   OSA on CPAP   SECONDARY DIAGNOSIS:   Past Medical History  Diagnosis Date  . Diabetes mellitus without complication ([place])   . Hypertension   . COPD (chronic obstructive pulmonary disease) ([place])   . Hyperlipidemia     HOSPITAL COURSE:   75y/oF with PMH of recurrent renal stones and UTI, DM, HTN, COPD and hyperlipidemia presents with sepsis  1. Sepsis- actually from UTI and also acute bronchitis. - Urine was nitrite and leuk esterase positive at urologist's office. Urine sterile here because she was on antibiotics. - Chest x-ray negative for any infiltrate. Sputum cultures positive. -WBC is improving. Urine and blood cultures have remained negative. -Discontinue vancomycin and Zosyn. Being discharged on Levaquin. Afebrile now.  2. COPD exacerbation with acute bronchitis- sputum cultures positive for gram-positive cocci and rods -Fevers have resolved. Continue 2 L chronic home oxygen. -Being discharged on Levaquin and steroid taper. -Continue home inhalers and nebulizers  3. Renal stone-1.5 cm left-sided renal stone noted on outpatient CT. -Appreciate urology consult. Patient denies any flank pain. Renal function is normal. -Renal ultrasound with no obstruction and hydronephrosis. -OutPatient follow-up recommended  4. Hypokalemia-replaced-as on  Lasix at home. Continue potassium supplements at discharge  5. Hypertension-on Norvasc, lisinopril, indapamide and Coreg  6. Diabetes mellitus-received low-dose Lantus in the hospital as she was on steroids. Not continued at discharge. -A1c is 6.8  -Restarted metformin at discharge. Follow-up with PCP about adjusting metformin dose.   Patient worked with physical therapy. No outpatient needs recommended. Being discharged in a stable condition today.  DISCHARGE CONDITIONS:   Stable  CONSULTS OBTAINED:  Treatment Team:  Nickie Retort, MD  DRUG ALLERGIES:  No Known Allergies  DISCHARGE MEDICATIONS:   Current Discharge Medication List    START taking these medications   Details  levofloxacin (LEVAQUIN) 500 MG tablet Take 1 tablet (500 mg total) by mouth daily. X 6 more days Qty: 6 tablet, Refills: 0    predniSONE (STERAPRED UNI-PAK 21 TAB) 10 MG (21) TBPK tablet Take 1 tablet (10 mg total) by mouth daily. 6 tabs PO x 1 day 5 tabs PO x 1 day 4 tabs PO x 1 day 3 tabs PO x 1 day 2 tabs PO x 1 day 1 tab PO x 1 day and stop Qty: 21 tablet, Refills: 0      CONTINUE these medications which have CHANGED   Details  Fluticasone-Salmeterol (ADVAIR DISKUS) 250-50 MCG/DOSE AEPB Inhale 1 puff into the lungs 2 (two) times daily. Qty: 1 each, Refills: 12    valACYclovir (VALTREX) 1000 MG tablet Take 1 tablet (1,000 mg total) by mouth daily as needed (for fever blisters). Qty: 30 tablet, Refills: 12      CONTINUE these medications which have NOT CHANGED   Details  albuterol (PROVENTIL) (2.5 MG/3ML) 0.083% nebulizer solution Take 3 mLs (2.5 mg total) by  nebulization every 6 (six) hours as needed for wheezing. Qty: 100 vial, Refills: 0    albuterol (VENTOLIN HFA) 108 (90 Base) MCG/ACT inhaler Inhale 2 puffs into the lungs every 6 (six) hours as needed for wheezing or shortness of breath. Qty: 2 Inhaler, Refills: 12    amLODipine (NORVASC) 2.5 MG tablet Take 2.5 mg by mouth  daily.     Blood Glucose Monitoring Suppl (ACCU-CHEK AVIVA CONNECT) w/Device KIT 1 kit by Other route 2 (two) times daily. Qty: 1 kit, Refills: 11   Associated Diagnoses: Type 2 diabetes mellitus without complication, unspecified long term insulin use status (HCC)    carvedilol (COREG) 25 MG tablet TAKE 1 TABLET BY MOUTH TWICE DAILY Qty: 60 tablet, Refills: 6    clonazePAM (KLONOPIN) 0.5 MG tablet 0.5 mg at bedtime.     furosemide (LASIX) 20 MG tablet Take 1 tablet (20 mg total) by mouth daily. Qty: 30 tablet, Refills: 3    gabapentin (NEURONTIN) 800 MG tablet Take 1 tablet three times a day. Qty: 270 tablet, Refills: 3   Associated Diagnoses: Neuropathic pain    indapamide (LOZOL) 1.25 MG tablet TAKE ONE TABLET BY MOUTH EVERY DAY Qty: 30 tablet, Refills: 6    lisinopril (PRINIVIL,ZESTRIL) 40 MG tablet TAKE ONE (1) TABLET BY MOUTH EVERY DAY Qty: 90 tablet, Refills: 3    metFORMIN (GLUCOPHAGE) 500 MG tablet Take 500 mg by mouth daily with breakfast.     montelukast (SINGULAIR) 10 MG tablet Take 1 tablet (10 mg total) by mouth at bedtime. Qty: 30 tablet, Refills: 12    oxyCODONE (OXY IR/ROXICODONE) 5 MG immediate release tablet Take 5 mg by mouth 3 (three) times daily.     OXYGEN Inhale 2 L into the lungs continuous.    potassium chloride (K-DUR) 10 MEQ tablet Take 10 mEq by mouth 2 (two) times daily.    SPIRIVA HANDIHALER 18 MCG inhalation capsule ONE CAPSULE INHALED VIA UNIT ONCE EVERY 24 HOURS Qty: 30 capsule, Refills: 6      STOP taking these medications     nitrofurantoin, macrocrystal-monohydrate, (MACROBID) 100 MG capsule          DISCHARGE INSTRUCTIONS:   1. PCP follow-up in 1-2 weeks 2. Urology follow-up in 2 weeks  If you experience worsening of your admission symptoms, develop shortness of breath, life threatening emergency, suicidal or homicidal thoughts you must seek medical attention immediately by calling 911 or calling your MD immediately  if symptoms  less severe.  You Must read complete instructions/literature along with all the possible adverse reactions/side effects for all the Medicines you take and that have been prescribed to you. Take any new Medicines after you have completely understood and accept all the possible adverse reactions/side effects.   Please note  You were cared for by a hospitalist during your hospital stay. If you have any questions about your discharge medications or the care you received while you were in the hospital after you are discharged, you can call the unit and asked to speak with the hospitalist on call if the hospitalist that took care of you is not available. Once you are discharged, your primary care physician will handle any further medical issues. Please note that NO REFILLS for any discharge medications will be authorized once you are discharged, as it is imperative that you return to your primary care physician (or establish a relationship with a primary care physician if you do not have one) for your aftercare needs so that they can  reassess your need for medications and monitor your lab values.    Today   CHIEF COMPLAINT:   Chief Complaint  Patient presents with  . Shortness of Breath    VITAL SIGNS:  Blood pressure 131/58, pulse 68, temperature 98.1 F (36.7 C), temperature source Axillary, resp. rate 20, height 5' 9"  (1.753 m), weight 107.616 kg (237 lb 4 oz), SpO2 98 %.  I/O:   Intake/Output Summary (Last 24 hours) at 04/19/16 0834 Last data filed at 04/18/16 1700  Gross per 24 hour  Intake    483 ml  Output      0 ml  Net    483 ml    PHYSICAL EXAMINATION:   Physical Exam  GENERAL: 75 y.o.-year-old obese patient lying in the bed with no acute distress.  EYES: Pupils equal, round, reactive to light and accommodation. No scleral icterus. Extraocular muscles intact.  HEENT: Head atraumatic, normocephalic. Oropharynx and nasopharynx clear.  NECK: Supple, no jugular venous  distention. No thyroid enlargement, no tenderness.  LUNGS: Normal breath sounds bilaterally, no  rales,rhonchi or crepitation. No use of accessory muscles of respiration. Decreased bibasilar breath sounds, occasional scattered wheezes noted. CARDIOVASCULAR: S1, S2 normal. No murmurs, rubs, or gallops.  ABDOMEN: Soft, nontender, nondistended. Bowel sounds present. No organomegaly or mass.  EXTREMITIES: No pedal edema, cyanosis, or clubbing.  NEUROLOGIC: Cranial nerves II through XII are intact. Muscle strength 5/5 in all extremities. Sensation intact. Gait not checked.  PSYCHIATRIC: The patient is alert and oriented x 3.  SKIN: No obvious rash, lesion, or ulcer.   DATA REVIEW:   CBC  Recent Labs Lab 04/19/16 0423  WBC 16.7*  HGB 11.5*  HCT 35.3  PLT 154    Chemistries   Recent Labs Lab 04/17/16 2322  04/19/16 0423  NA 139  < > 141  K 3.7  < > 4.0  CL 103  < > 109  CO2 29  < > 25  GLUCOSE 222*  < > 190*  BUN 32*  < > 27*  CREATININE 1.21*  < > 1.10*  CALCIUM 8.9  < > 8.9  AST 101*  --   --   ALT 39  --   --   ALKPHOS 61  --   --   BILITOT 0.9  --   --   < > = values in this interval not displayed.  Cardiac Enzymes  Recent Labs Lab 04/17/16 2322  TROPONINI <0.03    Microbiology Results  Results for orders placed or performed during the hospital encounter of 04/17/16  Culture, blood (Routine x 2)     Status: None (Preliminary result)   Collection Time: 04/17/16 11:23 PM  Result Value Ref Range Status   Specimen Description BLOOD LEFT ASSIST CONTROL  Final   Special Requests BOTTLES DRAWN AEROBIC AND ANAEROBIC [id number]  Final   Culture NO GROWTH 2 DAYS  Final   Report Status PENDING  Incomplete  Culture, blood (Routine x 2)     Status: None (Preliminary result)   Collection Time: 04/17/16 11:23 PM  Result Value Ref Range Status   Specimen Description BLOOD RIGHT ASSIST CONTROL  Final   Special Requests BOTTLES DRAWN AEROBIC AND ANAEROBIC  [place]  Final   Culture NO GROWTH 2 DAYS  Final   Report Status PENDING  Incomplete  Urine culture     Status: Abnormal (Preliminary result)   Collection Time: 04/17/16 11:23 PM  Result Value Ref Range Status   Specimen Description URINE, RANDOM  Final   Special Requests NONE  Final   Culture (A)  Final    50,000 COLONIES/mL GRAM NEGATIVE RODS IDENTIFICATION AND SUSCEPTIBILITIES TO FOLLOW    Report Status PENDING  Incomplete  Culture, sputum-assessment     Status: None   Collection Time: 04/18/16 10:26 AM  Result Value Ref Range Status   Specimen Description EXPECTORATED SPUTUM  Final   Special Requests NONE  Final   Sputum evaluation THIS SPECIMEN IS ACCEPTABLE FOR SPUTUM CULTURE  Final   Report Status 04/18/2016 FINAL  Final  Culture, respiratory (NON-Expectorated)     Status: None (Preliminary result)   Collection Time: 04/18/16 10:26 AM  Result Value Ref Range Status   Specimen Description EXPECTORATED SPUTUM  Final   Special Requests NONE Reflexed from [id number]  Final   Gram Stain   Final    MANY WBC SEEN RARE GRAM POSITIVE COCCI RARE GRAM POSITIVE RODS    Culture PENDING  Incomplete   Report Status PENDING  Incomplete    RADIOLOGY:  Dg Chest 2 View  04/17/2016  CLINICAL DATA:  Initial evaluation for acute shortness of breath. EXAM: CHEST  2 VIEW COMPARISON:  Prior study 02/28/2016. FINDINGS: Mild cardiomegaly is stable. Atheromatous plaque within the aortic arch. Mediastinal silhouette within normal limits. Trach air column midline and patent. Lungs are normally inflated. Changes related to COPD noted. No consolidative airspace disease. No pulmonary edema. No large pleural effusion. No pneumothorax. No acute osseous abnormality. IMPRESSION: 1. No active cardiopulmonary disease. 2. COPD. Electronically Signed   By: Jeannine Boga M.D.   On: 04/17/2016 23:47   US Renal  04/18/2016  CLINICAL DATA:  Nephrolithiasis. EXAM: RENAL / URINARY TRACT ULTRASOUND COMPLETE  COMPARISON:  09/19/2014 and 06/14/2014 FINDINGS: Right Kidney: Length: 11.5 cm. Echogenicity is normal. Mild prominence of the right renal pelvis, stable in appearance. Right renal cyst is 1.7 x 1.2 x 1.4 cm. Left Kidney: Length: 9.9 cm. Echogenicity is normal. No hydronephrosis. Midpole cyst is 1.6 x 1.5 x 1.4 cm. Bladder: Appears normal for degree of bladder distention. IMPRESSION: 1. Stable appearance of mild right pyelectasis. 2. Small bilateral renal cysts. Electronically Signed   By: Nolon Nations M.D.   On: 04/18/2016 12:32    EKG:   Orders placed or performed during the hospital encounter of 04/17/16  . EKG 12-Lead  . EKG 12-Lead  . EKG 12-Lead  . EKG 12-Lead      Management plans discussed with the patient, family and they are in agreement.  CODE STATUS:     Code Status Orders        Start     Ordered   04/18/16 0142  Full code   Continuous     04/18/16 0142    Code Status History    Date Active Date Inactive Code Status Order ID Comments User Context   02/28/2016  5:40 AM 03/01/2016  5:39 PM Full Code [id number]  Saundra Shelling, MD Inpatient      TOTAL TIME TAKING CARE OF THIS PATIENT: 37 minutes.    Gladstone Lighter M.D on 04/19/2016 at 8:34 AM  Between 7am to 6pm - Pager - [phone]  After 6pm go to [email or web address] - password [email or web address] [facility]  Office  [phone]  CC: Primary care physician; Dicky Doe, MD

## 2016-04-19 NOTE — Progress Notes (Signed)
Pt for discharge home. Alert  No resp distress. 02 in use at 2l [place]. Pt uses cpap at hs has owm machine.  Instructions discussed with pt. Pt to call for f/us.  presc given and discussed. Home meds discussed. Pt verbalizes understanding of all.

## 2016-04-20 ENCOUNTER — Telehealth: Payer: Self-pay | Admitting: Family Medicine

## 2016-04-20 DIAGNOSIS — J449 Chronic obstructive pulmonary disease, unspecified: Secondary | ICD-10-CM | POA: Diagnosis not present

## 2016-04-20 LAB — CULTURE, RESPIRATORY: CULTURE: NORMAL

## 2016-04-20 LAB — CULTURE, RESPIRATORY W GRAM STAIN

## 2016-04-20 NOTE — Telephone Encounter (Signed)
[address] [place])  [phone] needing a referral  to  [facility] for  tomorrow     Dr.  Ola Spurr [id number] NPI  [id number] Appt: April  24, 12:30  Brandi  @ [place] ext [phone]

## 2016-04-20 NOTE — Telephone Encounter (Signed)
Auth approved [id number] valid 04/20/16--10/17/16

## 2016-04-21 ENCOUNTER — Other Ambulatory Visit: Payer: Self-pay | Admitting: Family Medicine

## 2016-04-21 DIAGNOSIS — N390 Urinary tract infection, site not specified: Secondary | ICD-10-CM | POA: Insufficient documentation

## 2016-04-21 DIAGNOSIS — A498 Other bacterial infections of unspecified site: Secondary | ICD-10-CM | POA: Diagnosis not present

## 2016-04-21 DIAGNOSIS — Z1612 Extended spectrum beta lactamase (ESBL) resistance: Secondary | ICD-10-CM

## 2016-04-21 DIAGNOSIS — N200 Calculus of kidney: Secondary | ICD-10-CM | POA: Diagnosis not present

## 2016-04-21 LAB — URINE CULTURE: Culture: 50000 — AB

## 2016-04-21 MED ORDER — METFORMIN HCL 500 MG PO TABS: 500.0000 mg | ORAL_TABLET | Freq: Every day | ORAL | Status: DC

## 2016-04-22 LAB — CULTURE, BLOOD (ROUTINE X 2)
Culture: NO GROWTH
Culture: NO GROWTH

## 2016-04-28 DIAGNOSIS — N200 Calculus of kidney: Secondary | ICD-10-CM | POA: Diagnosis not present

## 2016-04-28 DIAGNOSIS — N390 Urinary tract infection, site not specified: Secondary | ICD-10-CM | POA: Diagnosis not present

## 2016-04-28 DIAGNOSIS — A498 Other bacterial infections of unspecified site: Secondary | ICD-10-CM | POA: Diagnosis not present

## 2016-04-28 DIAGNOSIS — Z1612 Extended spectrum beta lactamase (ESBL) resistance: Secondary | ICD-10-CM | POA: Diagnosis not present

## 2016-05-01 DIAGNOSIS — N200 Calculus of kidney: Secondary | ICD-10-CM | POA: Diagnosis not present

## 2016-05-01 DIAGNOSIS — N390 Urinary tract infection, site not specified: Secondary | ICD-10-CM | POA: Diagnosis not present

## 2016-05-01 DIAGNOSIS — A498 Other bacterial infections of unspecified site: Secondary | ICD-10-CM | POA: Diagnosis not present

## 2016-05-01 DIAGNOSIS — Z1612 Extended spectrum beta lactamase (ESBL) resistance: Secondary | ICD-10-CM | POA: Diagnosis not present

## 2016-05-04 DIAGNOSIS — N3020 Other chronic cystitis without hematuria: Secondary | ICD-10-CM | POA: Diagnosis not present

## 2016-05-04 DIAGNOSIS — N200 Calculus of kidney: Secondary | ICD-10-CM | POA: Diagnosis not present

## 2016-05-05 DIAGNOSIS — I313 Pericardial effusion (noninflammatory): Secondary | ICD-10-CM | POA: Diagnosis not present

## 2016-05-05 DIAGNOSIS — N200 Calculus of kidney: Secondary | ICD-10-CM | POA: Diagnosis not present

## 2016-05-05 DIAGNOSIS — K5730 Diverticulosis of large intestine without perforation or abscess without bleeding: Secondary | ICD-10-CM | POA: Diagnosis not present

## 2016-05-05 DIAGNOSIS — N8189 Other female genital prolapse: Secondary | ICD-10-CM | POA: Diagnosis not present

## 2016-05-05 DIAGNOSIS — N3020 Other chronic cystitis without hematuria: Secondary | ICD-10-CM | POA: Diagnosis not present

## 2016-05-05 DIAGNOSIS — L905 Scar conditions and fibrosis of skin: Secondary | ICD-10-CM | POA: Diagnosis not present

## 2016-05-05 DIAGNOSIS — Z90710 Acquired absence of both cervix and uterus: Secondary | ICD-10-CM | POA: Diagnosis not present

## 2016-05-09 DIAGNOSIS — J438 Other emphysema: Secondary | ICD-10-CM | POA: Diagnosis not present

## 2016-05-09 DIAGNOSIS — J449 Chronic obstructive pulmonary disease, unspecified: Secondary | ICD-10-CM | POA: Diagnosis not present

## 2016-05-12 DIAGNOSIS — I10 Essential (primary) hypertension: Secondary | ICD-10-CM | POA: Diagnosis not present

## 2016-05-12 DIAGNOSIS — G4733 Obstructive sleep apnea (adult) (pediatric): Secondary | ICD-10-CM | POA: Diagnosis not present

## 2016-05-18 DIAGNOSIS — G4733 Obstructive sleep apnea (adult) (pediatric): Secondary | ICD-10-CM | POA: Diagnosis not present

## 2016-05-20 DIAGNOSIS — J449 Chronic obstructive pulmonary disease, unspecified: Secondary | ICD-10-CM | POA: Diagnosis not present

## 2016-05-21 ENCOUNTER — Other Ambulatory Visit: Payer: Self-pay | Admitting: *Deleted

## 2016-05-21 MED ORDER — FUROSEMIDE 20 MG PO TABS: 20.0000 mg | ORAL_TABLET | Freq: Every day | ORAL | Status: DC

## 2016-05-22 ENCOUNTER — Ambulatory Visit: Payer: Commercial Managed Care - HMO | Admitting: Family Medicine

## 2016-05-26 ENCOUNTER — Ambulatory Visit: Payer: Commercial Managed Care - HMO | Admitting: Family Medicine

## 2016-06-01 DIAGNOSIS — G894 Chronic pain syndrome: Secondary | ICD-10-CM | POA: Diagnosis not present

## 2016-06-01 DIAGNOSIS — M150 Primary generalized (osteo)arthritis: Secondary | ICD-10-CM | POA: Diagnosis not present

## 2016-06-01 DIAGNOSIS — M5430 Sciatica, unspecified side: Secondary | ICD-10-CM | POA: Diagnosis not present

## 2016-06-02 ENCOUNTER — Telehealth: Payer: Self-pay | Admitting: Family Medicine

## 2016-06-02 NOTE — Telephone Encounter (Signed)
Jolene from Dr.Chasnis  Off called [phone] called stats they need an update referral

## 2016-06-02 NOTE — Telephone Encounter (Signed)
Megan Bolton.[place] 

## 2016-06-09 ENCOUNTER — Other Ambulatory Visit: Payer: Self-pay | Admitting: Anesthesiology

## 2016-06-09 DIAGNOSIS — M79605 Pain in left leg: Secondary | ICD-10-CM

## 2016-06-09 DIAGNOSIS — J449 Chronic obstructive pulmonary disease, unspecified: Secondary | ICD-10-CM | POA: Diagnosis not present

## 2016-06-09 DIAGNOSIS — J438 Other emphysema: Secondary | ICD-10-CM | POA: Diagnosis not present

## 2016-06-09 DIAGNOSIS — R2233 Localized swelling, mass and lump, upper limb, bilateral: Secondary | ICD-10-CM

## 2016-06-11 ENCOUNTER — Telehealth: Payer: Self-pay | Admitting: Family Medicine

## 2016-06-11 NOTE — Telephone Encounter (Signed)
Pt needs a [facility] referral for visit with Dr. Vella Kohler on June 22nd.  They told her the one she had expired in April.

## 2016-06-11 NOTE — Telephone Encounter (Signed)
Duplicate message closed.[place]

## 2016-06-11 NOTE — Telephone Encounter (Signed)
Pt. Called states that she have a appt. With  Dr. Raul Del @[facility]  on  June  22 so she said she need a referral . Pt call back # is [phone]

## 2016-06-12 DIAGNOSIS — I10 Essential (primary) hypertension: Secondary | ICD-10-CM | POA: Diagnosis not present

## 2016-06-12 DIAGNOSIS — G4733 Obstructive sleep apnea (adult) (pediatric): Secondary | ICD-10-CM | POA: Diagnosis not present

## 2016-06-12 NOTE — Telephone Encounter (Signed)
[employer] referral entered for Dr. Raul Del. Patient notified and referral faxed to [facility].

## 2016-06-20 DIAGNOSIS — J449 Chronic obstructive pulmonary disease, unspecified: Secondary | ICD-10-CM | POA: Diagnosis not present

## 2016-06-29 ENCOUNTER — Other Ambulatory Visit: Payer: Self-pay | Admitting: Family Medicine

## 2016-07-01 ENCOUNTER — Other Ambulatory Visit: Payer: Self-pay | Admitting: Family Medicine

## 2016-07-01 ENCOUNTER — Other Ambulatory Visit: Payer: Self-pay | Admitting: *Deleted

## 2016-07-01 ENCOUNTER — Ambulatory Visit
Admission: RE | Admit: 2016-07-01 | Discharge: 2016-07-01 | Disposition: A | Discharge status: Home / Self Care | Payer: Commercial Managed Care - HMO | Source: Ambulatory Visit | Attending: Anesthesiology | Admitting: Anesthesiology

## 2016-07-01 DIAGNOSIS — J439 Emphysema, unspecified: Secondary | ICD-10-CM | POA: Diagnosis not present

## 2016-07-01 DIAGNOSIS — M79605 Pain in left leg: Secondary | ICD-10-CM

## 2016-07-01 DIAGNOSIS — M5418 Radiculopathy, sacral and sacrococcygeal region: Secondary | ICD-10-CM | POA: Diagnosis not present

## 2016-07-01 DIAGNOSIS — M79604 Pain in right leg: Secondary | ICD-10-CM | POA: Diagnosis not present

## 2016-07-01 DIAGNOSIS — M545 Low back pain: Secondary | ICD-10-CM | POA: Diagnosis not present

## 2016-07-01 DIAGNOSIS — M79601 Pain in right arm: Secondary | ICD-10-CM | POA: Insufficient documentation

## 2016-07-01 DIAGNOSIS — M5126 Other intervertebral disc displacement, lumbar region: Secondary | ICD-10-CM | POA: Diagnosis not present

## 2016-07-01 DIAGNOSIS — M4807 Spinal stenosis, lumbosacral region: Secondary | ICD-10-CM | POA: Insufficient documentation

## 2016-07-01 DIAGNOSIS — G4733 Obstructive sleep apnea (adult) (pediatric): Secondary | ICD-10-CM | POA: Diagnosis not present

## 2016-07-01 DIAGNOSIS — R2233 Localized swelling, mass and lump, upper limb, bilateral: Secondary | ICD-10-CM

## 2016-07-01 DIAGNOSIS — M1288 Other specific arthropathies, not elsewhere classified, other specified site: Secondary | ICD-10-CM | POA: Insufficient documentation

## 2016-07-01 DIAGNOSIS — R531 Weakness: Secondary | ICD-10-CM | POA: Insufficient documentation

## 2016-07-01 DIAGNOSIS — R0609 Other forms of dyspnea: Secondary | ICD-10-CM | POA: Diagnosis not present

## 2016-07-01 MED ORDER — TIOTROPIUM BROMIDE MONOHYDRATE 18 MCG IN CAPS: 18.0000 ug | ORAL_CAPSULE | Freq: Every day | RESPIRATORY_TRACT | Status: DC

## 2016-07-02 ENCOUNTER — Other Ambulatory Visit: Payer: Self-pay | Admitting: Family Medicine

## 2016-07-02 DIAGNOSIS — M792 Neuralgia and neuritis, unspecified: Secondary | ICD-10-CM

## 2016-07-02 MED ORDER — INDAPAMIDE 1.25 MG PO TABS: 1.2500 mg | ORAL_TABLET | Freq: Every day | ORAL | Status: DC

## 2016-07-02 MED ORDER — POTASSIUM CHLORIDE ER 10 MEQ PO TBCR: 10.0000 meq | EXTENDED_RELEASE_TABLET | Freq: Two times a day (BID) | ORAL | Status: DC

## 2016-07-09 DIAGNOSIS — J438 Other emphysema: Secondary | ICD-10-CM | POA: Diagnosis not present

## 2016-07-09 DIAGNOSIS — J449 Chronic obstructive pulmonary disease, unspecified: Secondary | ICD-10-CM | POA: Diagnosis not present

## 2016-07-12 DIAGNOSIS — G4733 Obstructive sleep apnea (adult) (pediatric): Secondary | ICD-10-CM | POA: Diagnosis not present

## 2016-07-12 DIAGNOSIS — I10 Essential (primary) hypertension: Secondary | ICD-10-CM | POA: Diagnosis not present

## 2016-07-15 ENCOUNTER — Telehealth: Payer: Self-pay | Admitting: Family Medicine

## 2016-07-15 NOTE — Telephone Encounter (Signed)
Have her hold Amlodipine and Lisinopril for now.  If BP remains low, may need trip to ER .-jh

## 2016-07-15 NOTE — Telephone Encounter (Signed)
Spoke with patient's daughter. She is aware of recommendations. They have scheduled a f/u for 07/27/16.

## 2016-07-15 NOTE — Telephone Encounter (Signed)
Pt   Daughter called states that pt  BP dropped last night  96/53 was not sure what she need to do. [name]  Call back # is  [phone]

## 2016-07-17 DIAGNOSIS — R339 Retention of urine, unspecified: Secondary | ICD-10-CM | POA: Diagnosis not present

## 2016-07-17 DIAGNOSIS — N200 Calculus of kidney: Secondary | ICD-10-CM | POA: Diagnosis not present

## 2016-07-17 DIAGNOSIS — R8271 Bacteriuria: Secondary | ICD-10-CM | POA: Insufficient documentation

## 2016-07-17 DIAGNOSIS — N3020 Other chronic cystitis without hematuria: Secondary | ICD-10-CM | POA: Diagnosis not present

## 2016-07-19 DIAGNOSIS — R339 Retention of urine, unspecified: Secondary | ICD-10-CM | POA: Insufficient documentation

## 2016-07-20 DIAGNOSIS — J449 Chronic obstructive pulmonary disease, unspecified: Secondary | ICD-10-CM | POA: Diagnosis not present

## 2016-07-27 ENCOUNTER — Ambulatory Visit (INDEPENDENT_AMBULATORY_CARE_PROVIDER_SITE_OTHER): Payer: Commercial Managed Care - HMO | Admitting: Family Medicine

## 2016-07-27 ENCOUNTER — Encounter: Payer: Self-pay | Admitting: Family Medicine

## 2016-07-27 VITALS — BP 110/60 | HR 78 | Temp 98.0°F | Ht 69.0 in | Wt 230.0 lb

## 2016-07-27 DIAGNOSIS — I272 Other secondary pulmonary hypertension: Secondary | ICD-10-CM

## 2016-07-27 DIAGNOSIS — N183 Chronic kidney disease, stage 3 (moderate): Secondary | ICD-10-CM

## 2016-07-27 DIAGNOSIS — E1100 Type 2 diabetes mellitus with hyperosmolarity without nonketotic hyperglycemic-hyperosmolar coma (NKHHC): Secondary | ICD-10-CM | POA: Diagnosis not present

## 2016-07-27 DIAGNOSIS — I10 Essential (primary) hypertension: Secondary | ICD-10-CM

## 2016-07-27 LAB — POCT GLYCOSYLATED HEMOGLOBIN (HGB A1C): Hemoglobin A1C: 7.1

## 2016-07-27 NOTE — Progress Notes (Signed)
Name: Megan Bolton   MRN: [id number]    DOB: [birth year]   Date:07/27/2016       Progress Note  Subjective  Chief Complaint  Chief Complaint  Patient presents with  . Hypertension  . Anorexia    HPI Here for f/u of HBP and DM.  Still has chronic leg pain that she c/o daily.  She ssays that she has very little appetite and has lost 16# in last 4 months.  No problem-specific Assessment & Plan notes found for this encounter.   Past Medical History:  Diagnosis Date  . COPD (chronic obstructive pulmonary disease) ([place])   . Diabetes mellitus without complication ([place])   . Hyperlipidemia   . Hypertension     Past Surgical History:  Procedure Laterality Date  . ABDOMINAL HYSTERECTOMY    . SPINE SURGERY      Family History  Problem Relation Age of Onset  . Hypertension Paternal Grandfather     Social History   Social History  . Marital status: Widowed    Spouse name: N/A  . Number of children: N/A  . Years of education: N/A   Occupational History  . retired    Social History Main Topics  . Smoking status: Former Smoker    Packs/day: 0.50    Quit date: 04/28/2015  . Smokeless tobacco: Never Used  . Alcohol use No  . Drug use: No  . Sexual activity: Not on file   Other Topics Concern  . Not on file   Social History Narrative  . No narrative on file     Current Outpatient Prescriptions:  .  ACCU-CHEK AVIVA PLUS test strip, 1 strip by Other route 2 (two) times daily., Disp: , Rfl:  .  ACCU-CHEK SOFTCLIX LANCETS lancets, 1 each by Other route 2 (two) times daily., Disp: , Rfl:  .  albuterol (PROVENTIL) (2.5 MG/3ML) 0.083% nebulizer solution, Take 3 mLs (2.5 mg total) by nebulization every 6 (six) hours as needed for wheezing., Disp: 100 vial, Rfl: 0 .  albuterol (VENTOLIN HFA) 108 (90 Base) MCG/ACT inhaler, Inhale 2 puffs into the lungs every 6 (six) hours as needed for wheezing or shortness of breath., Disp: 2 Inhaler, Rfl: 12 .  Blood Glucose Monitoring Suppl  (ACCU-CHEK AVIVA CONNECT) w/Device KIT, 1 kit by Other route 2 (two) times daily., Disp: 1 kit, Rfl: 11 .  carvedilol (COREG) 25 MG tablet, TAKE 1 TABLET BY MOUTH TWICE DAILY (Patient taking differently: Taking 1/2 tablet twice a day.), Disp: 60 tablet, Rfl: 6 .  clonazePAM (KLONOPIN) 0.5 MG tablet, 0.5 mg at bedtime. , Disp: , Rfl:  .  doxycycline (VIBRAMYCIN) 100 MG capsule, Take 100 mg by mouth daily., Disp: , Rfl:  .  Fluticasone-Salmeterol (ADVAIR DISKUS) 250-50 MCG/DOSE AEPB, Inhale 1 puff into the lungs 2 (two) times daily., Disp: 1 each, Rfl: 12 .  furosemide (LASIX) 20 MG tablet, Take 1 tablet (20 mg total) by mouth daily., Disp: 30 tablet, Rfl: 3 .  gabapentin (NEURONTIN) 800 MG tablet, Take 1 tablet three times a day., Disp: 270 tablet, Rfl: 3 .  gabapentin (NEURONTIN) 800 MG tablet, Take 800 mg by mouth 3 (three) times daily., Disp: , Rfl:  .  metFORMIN (GLUCOPHAGE) 500 MG tablet, Take 1 tablet (500 mg total) by mouth daily with breakfast., Disp: 30 tablet, Rfl: 12 .  montelukast (SINGULAIR) 10 MG tablet, Take 1 tablet (10 mg total) by mouth at bedtime., Disp: 30 tablet, Rfl: 12 .  oxyCODONE (OXY IR/ROXICODONE)  5 MG immediate release tablet, Take 5 mg by mouth 3 (three) times daily. , Disp: , Rfl:  .  OXYGEN, Inhale 2 L into the lungs continuous., Disp: , Rfl:  .  potassium chloride (K-DUR) 10 MEQ tablet, Take 1 tablet (10 mEq total) by mouth 2 (two) times daily., Disp: 20 tablet, Rfl: 0 .  predniSONE (STERAPRED UNI-PAK 21 TAB) 10 MG (21) TBPK tablet, Take 1 tablet (10 mg total) by mouth daily. 6 tabs PO x 1 day 5 tabs PO x 1 day 4 tabs PO x 1 day 3 tabs PO x 1 day 2 tabs PO x 1 day 1 tab PO x 1 day and stop, Disp: 21 tablet, Rfl: 0 .  tiotropium (SPIRIVA HANDIHALER) 18 MCG inhalation capsule, Place 1 capsule (18 mcg total) into inhaler and inhale daily., Disp: 30 capsule, Rfl: 6 .  valACYclovir (VALTREX) 1000 MG tablet, Take 1 tablet (1,000 mg total) by mouth daily as needed (for fever  blisters)., Disp: 30 tablet, Rfl: 12 .  amLODipine (NORVASC) 2.5 MG tablet, TAKE 1 TABLET BY MOUTH EACH DAY (Patient not taking: Reported on 07/27/2016), Disp: 30 tablet, Rfl: 5 .  levofloxacin (LEVAQUIN) 500 MG tablet, Take 1 tablet (500 mg total) by mouth daily. X 6 more days (Patient not taking: Reported on 07/27/2016), Disp: 6 tablet, Rfl: 0 .  lisinopril (PRINIVIL,ZESTRIL) 40 MG tablet, TAKE ONE (1) TABLET BY MOUTH EVERY DAY (Patient not taking: Reported on 07/27/2016), Disp: 90 tablet, Rfl: 3  Allergies  Allergen Reactions  . Nitrofurantoin Anaphylaxis     Review of Systems  Constitutional: Positive for malaise/fatigue and weight loss. Negative for chills and fever.  HENT: Negative for hearing loss.   Eyes: Negative for blurred vision and double vision.  Respiratory: Negative for cough, shortness of breath and wheezing.   Cardiovascular: Negative for chest pain, palpitations and leg swelling.  Gastrointestinal: Negative for abdominal pain, blood in stool and heartburn.  Genitourinary: Negative for dysuria, frequency and urgency.  Musculoskeletal: Positive for myalgias (chronic L leg pain).  Skin: Negative for rash.  Neurological: Negative for dizziness, tremors, weakness and headaches.  Psychiatric/Behavioral: Negative for depression.      Objective  Vitals:   07/27/16 1534 07/27/16 1607 07/27/16 1608  BP: (!) 147/77 110/60   Pulse: 76  78  Temp: 98 F (36.7 C)    TempSrc: Oral    Weight: 230 lb (104.3 kg)    Height: 5' 9"  (1.753 m)      Physical Exam  Constitutional: She is oriented to person, place, and time and well-developed, well-nourished, and in no distress. No distress.  HENT:  Head: Normocephalic and atraumatic.  Eyes: Conjunctivae and EOM are normal. Pupils are equal, round, and reactive to light.  Neck: Normal range of motion. Neck supple. Carotid bruit is not present. No thyromegaly present.  Cardiovascular: Normal rate, regular rhythm and normal heart  sounds.  Exam reveals no gallop and no friction rub.   No murmur heard. Pulmonary/Chest: Effort normal and breath sounds normal. No respiratory distress. She has no wheezes. She has no rales.  Musculoskeletal: She exhibits no edema.  Lymphadenopathy:    She has no cervical adenopathy.  Neurological: She is alert and oriented to person, place, and time.  Vitals reviewed.      No results found for this or any previous visit (from the past 2160 hour(s)).   Assessment & Plan  Problem List Items Addressed This Visit      Cardiovascular and Mediastinum  Essential (primary) hypertension - Primary   Hypertensive pulmonary vascular disease (HCC)     Endocrine   Type 2 diabetes mellitus ([place])     Genitourinary   [name] (chronic kidney disease)    Other Visit Diagnoses   None.     Meds ordered this encounter  Medications  . gabapentin (NEURONTIN) 800 MG tablet    Sig: Take 800 mg by mouth 3 (three) times daily.  Marland Kitchen DISCONTD: tiZANidine (ZANAFLEX) 4 MG tablet    Sig: Take 4 mg by mouth every 8 (eight) hours as needed.  . doxycycline (VIBRAMYCIN) 100 MG capsule    Sig: Take 100 mg by mouth daily.  Marland Kitchen ACCU-CHEK AVIVA PLUS test strip    Sig: 1 strip by Other route 2 (two) times daily.  Marland Kitchen ACCU-CHEK SOFTCLIX LANCETS lancets    Sig: 1 each by Other route 2 (two) times daily.    1. Essential (primary) hypertension Stop Indapamide.  Stay off Lisinopril and Amlodipine.  2. Hypertensive pulmonary vascular disease ([place])   3. Type 2 diabetes mellitus with hyperosmolarity without coma, without long-term current use of insulin (HCC)  conrt Metformin daily.  4. [name] (chronic kidney disease), stage 3 (moderate)  5. Chronic back and leg pain

## 2016-07-27 NOTE — Addendum Note (Signed)
Addended by: Devona Konig on: 07/27/2016 04:27 PM   Modules accepted: Orders

## 2016-08-09 DIAGNOSIS — J449 Chronic obstructive pulmonary disease, unspecified: Secondary | ICD-10-CM | POA: Diagnosis not present

## 2016-08-09 DIAGNOSIS — J438 Other emphysema: Secondary | ICD-10-CM | POA: Diagnosis not present

## 2016-08-10 DIAGNOSIS — M150 Primary generalized (osteo)arthritis: Secondary | ICD-10-CM | POA: Diagnosis not present

## 2016-08-10 DIAGNOSIS — M5430 Sciatica, unspecified side: Secondary | ICD-10-CM | POA: Diagnosis not present

## 2016-08-10 DIAGNOSIS — F1120 Opioid dependence, uncomplicated: Secondary | ICD-10-CM | POA: Diagnosis not present

## 2016-08-10 DIAGNOSIS — Z79899 Other long term (current) drug therapy: Secondary | ICD-10-CM | POA: Diagnosis not present

## 2016-08-10 DIAGNOSIS — G894 Chronic pain syndrome: Secondary | ICD-10-CM | POA: Diagnosis not present

## 2016-08-12 ENCOUNTER — Other Ambulatory Visit: Payer: Self-pay | Admitting: Family Medicine

## 2016-08-12 DIAGNOSIS — I10 Essential (primary) hypertension: Secondary | ICD-10-CM | POA: Diagnosis not present

## 2016-08-12 DIAGNOSIS — G4733 Obstructive sleep apnea (adult) (pediatric): Secondary | ICD-10-CM | POA: Diagnosis not present

## 2016-08-13 MED ORDER — POTASSIUM CHLORIDE ER 10 MEQ PO TBCR: 10.0000 meq | EXTENDED_RELEASE_TABLET | Freq: Two times a day (BID) | ORAL | 6 refills | Status: DC

## 2016-08-20 DIAGNOSIS — G4733 Obstructive sleep apnea (adult) (pediatric): Secondary | ICD-10-CM | POA: Diagnosis not present

## 2016-08-20 DIAGNOSIS — J449 Chronic obstructive pulmonary disease, unspecified: Secondary | ICD-10-CM | POA: Diagnosis not present

## 2016-08-21 ENCOUNTER — Telehealth: Payer: Self-pay | Admitting: *Deleted

## 2016-08-21 NOTE — Telephone Encounter (Signed)
[employer] referral submitted for DME 02/cpap supplies today. Awaiting approval.

## 2016-08-25 ENCOUNTER — Ambulatory Visit (INDEPENDENT_AMBULATORY_CARE_PROVIDER_SITE_OTHER): Payer: Commercial Managed Care - HMO | Admitting: Family Medicine

## 2016-08-25 ENCOUNTER — Encounter: Payer: Self-pay | Admitting: Family Medicine

## 2016-08-25 ENCOUNTER — Other Ambulatory Visit: Payer: Self-pay | Admitting: Family Medicine

## 2016-08-25 VITALS — BP 160/70 | HR 81 | Temp 97.5°F | Resp 16 | Ht 69.0 in | Wt 237.0 lb

## 2016-08-25 DIAGNOSIS — N182 Chronic kidney disease, stage 2 (mild): Secondary | ICD-10-CM | POA: Diagnosis not present

## 2016-08-25 DIAGNOSIS — M5136 Other intervertebral disc degeneration, lumbar region: Secondary | ICD-10-CM

## 2016-08-25 DIAGNOSIS — I10 Essential (primary) hypertension: Secondary | ICD-10-CM | POA: Diagnosis not present

## 2016-08-25 DIAGNOSIS — R748 Abnormal levels of other serum enzymes: Secondary | ICD-10-CM

## 2016-08-25 DIAGNOSIS — J441 Chronic obstructive pulmonary disease with (acute) exacerbation: Secondary | ICD-10-CM | POA: Diagnosis not present

## 2016-08-25 DIAGNOSIS — I272 Other secondary pulmonary hypertension: Secondary | ICD-10-CM | POA: Diagnosis not present

## 2016-08-25 LAB — CBC WITH DIFFERENTIAL/PLATELET
BASOS ABS: 125 {cells}/uL (ref 0–200)
BASOS PCT: 1 %
EOS PCT: 3 %
Eosinophils Absolute: 375 cells/uL (ref 15–500)
HEMATOCRIT: 34.3 % — AB (ref 35.0–45.0)
HEMOGLOBIN: 10.6 g/dL — AB (ref 11.7–15.5)
LYMPHS ABS: 3000 {cells}/uL (ref 850–3900)
Lymphocytes Relative: 24 %
MCH: 27.4 pg (ref 27.0–33.0)
MCHC: 30.9 g/dL — ABNORMAL LOW (ref 32.0–36.0)
MCV: 88.6 fL (ref 80.0–100.0)
MONOS PCT: 10 %
MPV: 11 fL (ref 7.5–12.5)
Monocytes Absolute: 1250 cells/uL — ABNORMAL HIGH (ref 200–950)
NEUTROS PCT: 62 %
Neutro Abs: 7750 cells/uL (ref 1500–7800)
Platelets: 261 10*3/uL (ref 140–400)
RBC: 3.87 MIL/uL (ref 3.80–5.10)
RDW: 13.4 % (ref 11.0–15.0)
WBC: 12.5 10*3/uL — AB (ref 3.8–10.8)

## 2016-08-25 MED ORDER — LISINOPRIL 20 MG PO TABS: 20.0000 mg | ORAL_TABLET | Freq: Every day | ORAL | 3 refills | Status: DC

## 2016-08-25 NOTE — Progress Notes (Signed)
Name: Megan Bolton   MRN: [id number]    DOB: 11/07/1941   Date:08/25/2016       Progress Note  Subjective  Chief Complaint  Chief Complaint  Patient presents with  . Hypertension    HPI Here for f/u of HBP.  She had stopped the Amlodipine and Lisinopril as directed.  She is takiong Carvedilol and Lasix and Potassium.  Says BPs at home run 160-200 systolioc at times.  She has a little HA on occasion.  No problem-specific Assessment & Plan notes found for this encounter.   Past Medical History:  Diagnosis Date  . COPD (chronic obstructive pulmonary disease) ([place])   . Diabetes mellitus without complication ([place])   . Hyperlipidemia   . Hypertension     Past Surgical History:  Procedure Laterality Date  . ABDOMINAL HYSTERECTOMY    . SPINE SURGERY      Family History  Problem Relation Age of Onset  . Hypertension Paternal Grandfather     Social History   Social History  . Marital status: Widowed    Spouse name: N/A  . Number of children: N/A  . Years of education: N/A   Occupational History  . retired    Social History Main Topics  . Smoking status: Former Smoker    Packs/day: 0.50    Quit date: 04/28/2015  . Smokeless tobacco: Never Used  . Alcohol use No  . Drug use: No  . Sexual activity: Not on file   Other Topics Concern  . Not on file   Social History Narrative  . No narrative on file     Current Outpatient Prescriptions:  .  ACCU-CHEK AVIVA PLUS test strip, 1 strip by Other route 2 (two) times daily., Disp: , Rfl:  .  ACCU-CHEK SOFTCLIX LANCETS lancets, 1 each by Other route 2 (two) times daily., Disp: , Rfl:  .  albuterol (PROVENTIL) (2.5 MG/3ML) 0.083% nebulizer solution, Take 3 mLs (2.5 mg total) by nebulization every 6 (six) hours as needed for wheezing., Disp: 100 vial, Rfl: 0 .  albuterol (VENTOLIN HFA) 108 (90 Base) MCG/ACT inhaler, Inhale 2 puffs into the lungs every 6 (six) hours as needed for wheezing or shortness of breath., Disp: 2  Inhaler, Rfl: 12 .  Blood Glucose Monitoring Suppl (ACCU-CHEK AVIVA CONNECT) w/Device KIT, 1 kit by Other route 2 (two) times daily., Disp: 1 kit, Rfl: 11 .  carvedilol (COREG) 25 MG tablet, TAKE 1 TABLET BY MOUTH TWICE DAILY (Patient taking differently: Taking 1/2 tablet twice a day.), Disp: 60 tablet, Rfl: 6 .  clonazePAM (KLONOPIN) 0.5 MG tablet, 0.5 mg at bedtime. , Disp: , Rfl:  .  Fluticasone-Salmeterol (ADVAIR DISKUS) 250-50 MCG/DOSE AEPB, Inhale 1 puff into the lungs 2 (two) times daily., Disp: 1 each, Rfl: 12 .  furosemide (LASIX) 20 MG tablet, TAKE ONE TABLET BY MOUTH EVERY DAY, Disp: 30 tablet, Rfl: 3 .  gabapentin (NEURONTIN) 800 MG tablet, Take 800 mg by mouth 3 (three) times daily., Disp: , Rfl:  .  metFORMIN (GLUCOPHAGE) 500 MG tablet, Take 1 tablet (500 mg total) by mouth daily with breakfast., Disp: 30 tablet, Rfl: 12 .  montelukast (SINGULAIR) 10 MG tablet, Take 1 tablet (10 mg total) by mouth at bedtime., Disp: 30 tablet, Rfl: 12 .  oxyCODONE (OXY IR/ROXICODONE) 5 MG immediate release tablet, Take 5 mg by mouth 3 (three) times daily. , Disp: , Rfl:  .  OXYGEN, Inhale 2 L into the lungs continuous., Disp: , Rfl:  .  potassium chloride (K-DUR) 10 MEQ tablet, Take 1 tablet (10 mEq total) by mouth 2 (two) times daily., Disp: 20 tablet, Rfl: 6 .  tiotropium (SPIRIVA HANDIHALER) 18 MCG inhalation capsule, Place 1 capsule (18 mcg total) into inhaler and inhale daily., Disp: 30 capsule, Rfl: 6 .  valACYclovir (VALTREX) 1000 MG tablet, Take 1 tablet (1,000 mg total) by mouth daily as needed (for fever blisters)., Disp: 30 tablet, Rfl: 12 .  lisinopril (PRINIVIL,ZESTRIL) 20 MG tablet, Take 1 tablet (20 mg total) by mouth daily., Disp: 90 tablet, Rfl: 3  Allergies  Allergen Reactions  . Nitrofurantoin Anaphylaxis     Review of Systems  Constitutional: Negative for chills, fever, malaise/fatigue and weight loss.  HENT: Negative for hearing loss.   Eyes: Negative for blurred vision and  double vision.  Respiratory: Positive for shortness of breath (mild). Negative for cough and wheezing.   Cardiovascular: Negative for chest pain, palpitations and leg swelling.  Gastrointestinal: Negative for abdominal pain, blood in stool and heartburn.  Genitourinary: Negative for dysuria, frequency and urgency.  Skin: Negative for rash.  Neurological: Negative for dizziness, tremors, weakness and headaches.      Objective  Vitals:   08/25/16 1556 08/25/16 1620  BP: (!) 168/74 (!) 160/70  Pulse: 81   Resp: 16   Temp: 97.5 F (36.4 C)   TempSrc: Oral   Weight: 237 lb (107.5 kg)   Height: _0  (1.753 m)     Physical Exam  Constitutional: She is oriented to person, place, and time and well-developed, well-nourished, and in no distress. No distress.  HENT:  Head: Normocephalic and atraumatic.  Eyes: Conjunctivae and EOM are normal. Pupils are equal, round, and reactive to light. No scleral icterus.  Neck: Normal range of motion. Neck supple. No thyromegaly present.  Cardiovascular: Normal rate, regular rhythm and normal heart sounds.  Exam reveals no gallop and no friction rub.   No murmur heard. Pulmonary/Chest: Effort normal and breath sounds normal. No respiratory distress. She has no wheezes. She has no rales.  Musculoskeletal: She exhibits no edema.  Lymphadenopathy:    She has no cervical adenopathy.  Neurological: She is alert and oriented to person, place, and time.  Vitals reviewed.      Recent Results (from the past 2160 hour(s))  POCT HgB A1C     Status: Abnormal   Collection Time: 07/27/16  4:27 PM  Result Value Ref Range   Hemoglobin A1C 7.1%      Assessment & Plan  Problem List Items Addressed This Visit      Cardiovascular and Mediastinum   Essential (primary) hypertension - Primary   Relevant Medications   lisinopril (PRINIVIL,ZESTRIL) 20 MG tablet   Other Relevant Orders   COMPLETE METABOLIC PANEL WITH GFR   Hypertensive pulmonary vascular  disease (HCC)   Relevant Medications   lisinopril (PRINIVIL,ZESTRIL) 20 MG tablet     Respiratory   Chronic obstructive pulmonary disease with acute exacerbation (HCC)     Musculoskeletal and Integument   DDD (degenerative disc disease), lumbar     Genitourinary   [name] (chronic kidney disease)   Relevant Orders   CBC with Differential    Other Visit Diagnoses   None.     Meds ordered this encounter  Medications  . lisinopril (PRINIVIL,ZESTRIL) 20 MG tablet    Sig: Take 1 tablet (20 mg total) by mouth daily.    Dispense:  90 tablet    Refill:  3  1. Essential (primary) hypertension Cont  Carvedilol - COMPLETE METABOLIC PANEL WITH GFR - lisinopril (PRINIVIL,ZESTRIL) 20 MG tablet; Take 1 tablet (20 mg total) by mouth daily.  Dispense: 90 tablet; Refill: 3  2. Hypertensive pulmonary vascular disease (HCC)  Cont Lasix 3. Chronic obstructive pulmonary disease with acute exacerbation ([place])   4. DDD (degenerative disc disease), lumbar  Cont Gabapentin 5. [name] (chronic kidney disease), stage 2 (mild)  - CBC with Differential

## 2016-08-26 LAB — COMPLETE METABOLIC PANEL WITH GFR
ALBUMIN: 3.4 g/dL — AB (ref 3.6–5.1)
ALK PHOS: 68 U/L (ref 33–130)
ALT: 15 U/L (ref 6–29)
AST: 12 U/L (ref 10–35)
BILIRUBIN TOTAL: 0.3 mg/dL (ref 0.2–1.2)
BUN: 19 mg/dL (ref 7–25)
CALCIUM: 8.9 mg/dL (ref 8.6–10.4)
CO2: 30 mmol/L (ref 20–31)
Chloride: 104 mmol/L (ref 98–110)
Creat: 1.18 mg/dL — ABNORMAL HIGH (ref 0.60–0.93)
GFR, EST NON AFRICAN AMERICAN: 46 mL/min — AB (ref >=60)
GFR, Est African American: 53 mL/min — ABNORMAL LOW (ref >=60)
GLUCOSE: 151 mg/dL — AB (ref 65–99)
POTASSIUM: 4.4 mmol/L (ref 3.5–5.3)
SODIUM: 141 mmol/L (ref 135–146)
TOTAL PROTEIN: 6.2 g/dL (ref 6.1–8.1)

## 2016-08-27 LAB — HEPATITIS C ANTIBODY: HCV Ab: NEGATIVE

## 2016-08-28 ENCOUNTER — Telehealth: Payer: Self-pay | Admitting: Family Medicine

## 2016-08-28 ENCOUNTER — Other Ambulatory Visit: Payer: Self-pay | Admitting: Family Medicine

## 2016-08-28 NOTE — Telephone Encounter (Signed)
Rx send and patient notified.

## 2016-08-28 NOTE — Addendum Note (Signed)
Addended by: Devona Konig on: 08/28/2016 09:45 AM   Modules accepted: Orders

## 2016-08-28 NOTE — Telephone Encounter (Signed)
Pt. Called requesting a refill on Albuterol

## 2016-09-01 ENCOUNTER — Ambulatory Visit: Payer: Commercial Managed Care - HMO

## 2016-09-01 ENCOUNTER — Ambulatory Visit
Admission: RE | Admit: 2016-09-01 | Discharge: 2016-09-01 | Disposition: A | Discharge status: Home / Self Care | Payer: Commercial Managed Care - HMO | Source: Ambulatory Visit | Attending: Family Medicine | Admitting: Family Medicine

## 2016-09-01 DIAGNOSIS — G4733 Obstructive sleep apnea (adult) (pediatric): Secondary | ICD-10-CM | POA: Diagnosis not present

## 2016-09-01 DIAGNOSIS — R05 Cough: Secondary | ICD-10-CM | POA: Diagnosis not present

## 2016-09-01 DIAGNOSIS — N281 Cyst of kidney, acquired: Secondary | ICD-10-CM | POA: Insufficient documentation

## 2016-09-01 DIAGNOSIS — R932 Abnormal findings on diagnostic imaging of liver and biliary tract: Secondary | ICD-10-CM | POA: Diagnosis not present

## 2016-09-01 DIAGNOSIS — K828 Other specified diseases of gallbladder: Secondary | ICD-10-CM | POA: Diagnosis not present

## 2016-09-01 DIAGNOSIS — K824 Cholesterolosis of gallbladder: Secondary | ICD-10-CM | POA: Insufficient documentation

## 2016-09-01 DIAGNOSIS — R748 Abnormal levels of other serum enzymes: Secondary | ICD-10-CM

## 2016-09-01 DIAGNOSIS — R0602 Shortness of breath: Secondary | ICD-10-CM | POA: Diagnosis not present

## 2016-09-09 ENCOUNTER — Telehealth: Payer: Self-pay | Admitting: Family Medicine

## 2016-09-09 DIAGNOSIS — E876 Hypokalemia: Secondary | ICD-10-CM

## 2016-09-09 DIAGNOSIS — J438 Other emphysema: Secondary | ICD-10-CM | POA: Diagnosis not present

## 2016-09-09 DIAGNOSIS — J449 Chronic obstructive pulmonary disease, unspecified: Secondary | ICD-10-CM | POA: Diagnosis not present

## 2016-09-09 MED ORDER — POTASSIUM CHLORIDE ER 10 MEQ PO TBCR: 10.0000 meq | EXTENDED_RELEASE_TABLET | Freq: Two times a day (BID) | ORAL | 5 refills | Status: DC

## 2016-09-09 NOTE — Telephone Encounter (Signed)
Megan Bolton at [facility] has a question about the quantity of potassium.  Her call back number is [phone]

## 2016-09-09 NOTE — Telephone Encounter (Signed)
Re-ordered potassium 63mEq BID with #60 pills for 30 day supply +5 refills. Previously ordered as #20 for 10 day supply. Reviewed chart, she appears to chronically be on lasix + potassium. Follow-up as planned with Dr Luan Pulling in 11/2016, recommend re-check chemistry, follow K.  Nobie Putnam, [facility] 09/09/2016, 3:39 PM

## 2016-09-09 NOTE — Telephone Encounter (Signed)
Spoke to the pharmacy her RX for K from 08/17 has direction for #20 tablet-- by mouth 2 times daily they want to confirm that should it be # 60 please suggest  And if it is can we resend RX ?

## 2016-09-12 DIAGNOSIS — G4733 Obstructive sleep apnea (adult) (pediatric): Secondary | ICD-10-CM | POA: Diagnosis not present

## 2016-09-12 DIAGNOSIS — I10 Essential (primary) hypertension: Secondary | ICD-10-CM | POA: Diagnosis not present

## 2016-09-14 NOTE — Telephone Encounter (Signed)
Noted-jh 

## 2016-09-20 DIAGNOSIS — J449 Chronic obstructive pulmonary disease, unspecified: Secondary | ICD-10-CM | POA: Diagnosis not present

## 2016-09-24 DIAGNOSIS — J439 Emphysema, unspecified: Secondary | ICD-10-CM | POA: Diagnosis not present

## 2016-09-28 ENCOUNTER — Other Ambulatory Visit: Payer: Self-pay | Admitting: Family Medicine

## 2016-09-28 ENCOUNTER — Telehealth: Payer: Self-pay | Admitting: Family Medicine

## 2016-09-28 ENCOUNTER — Ambulatory Visit (INDEPENDENT_AMBULATORY_CARE_PROVIDER_SITE_OTHER): Payer: Commercial Managed Care - HMO | Admitting: *Deleted

## 2016-09-28 DIAGNOSIS — Z23 Encounter for immunization: Secondary | ICD-10-CM

## 2016-09-28 DIAGNOSIS — G5601 Carpal tunnel syndrome, right upper limb: Secondary | ICD-10-CM

## 2016-09-28 NOTE — Telephone Encounter (Signed)
Pt needs a [employer] referral to Dr. Arvella Nigh at [facility] for carpal tunnel.  Her call back number is [phone]

## 2016-09-28 NOTE — Progress Notes (Signed)
Patient requesting referral to [facility] from ortho. referral entered and patient notified she may need office visit.

## 2016-10-09 ENCOUNTER — Telehealth: Payer: Self-pay | Admitting: *Deleted

## 2016-10-09 DIAGNOSIS — J449 Chronic obstructive pulmonary disease, unspecified: Secondary | ICD-10-CM | POA: Diagnosis not present

## 2016-10-09 DIAGNOSIS — J438 Other emphysema: Secondary | ICD-10-CM | POA: Diagnosis not present

## 2016-10-09 NOTE — Telephone Encounter (Signed)
[employer] Auth for [facility] 10/12/16 thru 04/10/2017 G56.01 Auth [id number]

## 2016-10-12 DIAGNOSIS — G894 Chronic pain syndrome: Secondary | ICD-10-CM | POA: Diagnosis not present

## 2016-10-12 DIAGNOSIS — G5601 Carpal tunnel syndrome, right upper limb: Secondary | ICD-10-CM | POA: Diagnosis not present

## 2016-10-12 DIAGNOSIS — M5430 Sciatica, unspecified side: Secondary | ICD-10-CM | POA: Diagnosis not present

## 2016-10-12 DIAGNOSIS — Z720 Tobacco use: Secondary | ICD-10-CM | POA: Diagnosis not present

## 2016-10-12 DIAGNOSIS — M150 Primary generalized (osteo)arthritis: Secondary | ICD-10-CM | POA: Diagnosis not present

## 2016-10-20 DIAGNOSIS — J449 Chronic obstructive pulmonary disease, unspecified: Secondary | ICD-10-CM | POA: Diagnosis not present

## 2016-10-30 DIAGNOSIS — G5601 Carpal tunnel syndrome, right upper limb: Secondary | ICD-10-CM | POA: Diagnosis not present

## 2016-11-09 DIAGNOSIS — J449 Chronic obstructive pulmonary disease, unspecified: Secondary | ICD-10-CM | POA: Diagnosis not present

## 2016-11-09 DIAGNOSIS — J438 Other emphysema: Secondary | ICD-10-CM | POA: Diagnosis not present

## 2016-11-20 DIAGNOSIS — J449 Chronic obstructive pulmonary disease, unspecified: Secondary | ICD-10-CM | POA: Diagnosis not present

## 2016-12-01 ENCOUNTER — Encounter: Payer: Self-pay | Admitting: *Deleted

## 2016-12-01 ENCOUNTER — Encounter: Payer: Self-pay | Admitting: Family Medicine

## 2016-12-01 ENCOUNTER — Other Ambulatory Visit: Payer: Self-pay | Admitting: *Deleted

## 2016-12-01 ENCOUNTER — Ambulatory Visit (INDEPENDENT_AMBULATORY_CARE_PROVIDER_SITE_OTHER): Payer: Commercial Managed Care - HMO | Admitting: Family Medicine

## 2016-12-01 VITALS — BP 170/80 | HR 76 | Temp 97.4°F | Resp 16 | Ht 69.0 in | Wt 222.0 lb

## 2016-12-01 DIAGNOSIS — I10 Essential (primary) hypertension: Secondary | ICD-10-CM

## 2016-12-01 DIAGNOSIS — E1121 Type 2 diabetes mellitus with diabetic nephropathy: Secondary | ICD-10-CM

## 2016-12-01 DIAGNOSIS — J441 Chronic obstructive pulmonary disease with (acute) exacerbation: Secondary | ICD-10-CM

## 2016-12-01 DIAGNOSIS — N182 Chronic kidney disease, stage 2 (mild): Secondary | ICD-10-CM

## 2016-12-01 DIAGNOSIS — M5136 Other intervertebral disc degeneration, lumbar region: Secondary | ICD-10-CM

## 2016-12-01 DIAGNOSIS — R7309 Other abnormal glucose: Secondary | ICD-10-CM | POA: Diagnosis not present

## 2016-12-01 DIAGNOSIS — E7800 Pure hypercholesterolemia, unspecified: Secondary | ICD-10-CM

## 2016-12-01 MED ORDER — ALBUTEROL SULFATE (2.5 MG/3ML) 0.083% IN NEBU: 2.5000 mg | INHALATION_SOLUTION | Freq: Four times a day (QID) | RESPIRATORY_TRACT | 12 refills | Status: DC | PRN

## 2016-12-01 MED ORDER — GABAPENTIN 800 MG PO TABS: 800.0000 mg | ORAL_TABLET | Freq: Three times a day (TID) | ORAL | 3 refills | Status: AC

## 2016-12-01 MED ORDER — CARVEDILOL 25 MG PO TABS: 25.0000 mg | ORAL_TABLET | Freq: Two times a day (BID) | ORAL | 3 refills | Status: DC

## 2016-12-01 MED ORDER — MUPIROCIN 2 % EX OINT: TOPICAL_OINTMENT | CUTANEOUS | 3 refills | Status: DC

## 2016-12-01 NOTE — Progress Notes (Signed)
Name: Megan Bolton   MRN: [id number]    DOB: Apr 02, 1941   Date:12/01/2016       Progress Note  Subjective  Chief Complaint  Chief Complaint  Patient presents with  . Hypertension    HPI Here for HBP and DM.  Has neuropathy also.  She has had recent carpal tunnel surgery on L hand.  BSs run 150-170.      No problem-specific Assessment & Plan notes found for this encounter.   Past Medical History:  Diagnosis Date  . COPD (chronic obstructive pulmonary disease) ([place])   . Diabetes mellitus without complication ([place])   . Hyperlipidemia   . Hypertension     Past Surgical History:  Procedure Laterality Date  . ABDOMINAL HYSTERECTOMY    . SPINE SURGERY      Family History  Problem Relation Age of Onset  . Hypertension Paternal Grandfather     Social History   Social History  . Marital status: Widowed    Spouse name: N/A  . Number of children: N/A  . Years of education: N/A   Occupational History  . retired    Social History Main Topics  . Smoking status: Former Smoker    Packs/day: 0.50    Quit date: 04/28/2015  . Smokeless tobacco: Never Used  . Alcohol use No  . Drug use: No  . Sexual activity: Not on file   Other Topics Concern  . Not on file   Social History Narrative  . No narrative on file     Current Outpatient Prescriptions:  .  ACCU-CHEK AVIVA PLUS test strip, 1 strip by Other route 2 (two) times daily., Disp: , Rfl:  .  ACCU-CHEK SOFTCLIX LANCETS lancets, 1 each by Other route 2 (two) times daily., Disp: , Rfl:  .  albuterol (PROVENTIL) (2.5 MG/3ML) 0.083% nebulizer solution, Take 3 mLs (2.5 mg total) by nebulization every 6 (six) hours as needed for wheezing., Disp: 180 vial, Rfl: 12 .  albuterol (VENTOLIN HFA) 108 (90 Base) MCG/ACT inhaler, Inhale 2 puffs into the lungs every 6 (six) hours as needed for wheezing or shortness of breath., Disp: 2 Inhaler, Rfl: 12 .  Blood Glucose Monitoring Suppl (ACCU-CHEK AVIVA CONNECT) w/Device KIT, 1 kit by  Other route 2 (two) times daily., Disp: 1 kit, Rfl: 11 .  carvedilol (COREG) 25 MG tablet, Take 1 tablet (25 mg total) by mouth 2 (two) times daily., Disp: 180 tablet, Rfl: 3 .  clonazePAM (KLONOPIN) 0.5 MG tablet, 0.5 mg at bedtime. , Disp: , Rfl:  .  doxycycline (VIBRAMYCIN) 100 MG capsule, Take 1 capsule by mouth daily. , Disp: , Rfl:  .  Fluticasone-Salmeterol (ADVAIR DISKUS) 250-50 MCG/DOSE AEPB, Inhale 1 puff into the lungs 2 (two) times daily., Disp: 1 each, Rfl: 12 .  furosemide (LASIX) 20 MG tablet, TAKE ONE TABLET BY MOUTH EVERY DAY, Disp: 30 tablet, Rfl: 3 .  gabapentin (NEURONTIN) 800 MG tablet, Take 1 tablet (800 mg total) by mouth 3 (three) times daily., Disp: 270 tablet, Rfl: 3 .  lisinopril (PRINIVIL,ZESTRIL) 20 MG tablet, Take 1 tablet (20 mg total) by mouth daily., Disp: 90 tablet, Rfl: 3 .  metFORMIN (GLUCOPHAGE) 500 MG tablet, Take 1 tablet (500 mg total) by mouth daily with breakfast., Disp: 30 tablet, Rfl: 12 .  montelukast (SINGULAIR) 10 MG tablet, Take 1 tablet (10 mg total) by mouth at bedtime., Disp: 30 tablet, Rfl: 12 .  oxyCODONE (OXY IR/ROXICODONE) 5 MG immediate release tablet, Take 5 mg  by mouth 3 (three) times daily. , Disp: , Rfl:  .  OXYGEN, Inhale 2 L into the lungs continuous., Disp: , Rfl:  .  potassium chloride (K-DUR) 10 MEQ tablet, Take 1 tablet (10 mEq total) by mouth 2 (two) times daily., Disp: 60 tablet, Rfl: 5 .  tiotropium (SPIRIVA HANDIHALER) 18 MCG inhalation capsule, Place 1 capsule (18 mcg total) into inhaler and inhale daily., Disp: 30 capsule, Rfl: 6 .  valACYclovir (VALTREX) 1000 MG tablet, Take 1 tablet (1,000 mg total) by mouth daily as needed (for fever blisters)., Disp: 30 tablet, Rfl: 12 .  mupirocin ointment (BACTROBAN) 2 %, One application as directed three times a day., Disp: 22 g, Rfl: 3 .  tiZANidine (ZANAFLEX) 4 MG tablet, Take 4 mg by mouth daily., Disp: , Rfl:   Allergies  Allergen Reactions  . Nitrofurantoin Anaphylaxis      Review of Systems  Constitutional: Negative for chills, fever, malaise/fatigue and weight loss.  HENT: Negative for hearing loss and tinnitus.   Eyes: Negative for blurred vision and double vision.  Respiratory: Negative for cough, shortness of breath and wheezing.   Cardiovascular: Negative for chest pain, palpitations and leg swelling.  Gastrointestinal: Negative for abdominal pain, blood in stool and heartburn.  Genitourinary: Negative for dysuria, frequency and urgency.  Musculoskeletal: Positive for back pain and joint pain. Negative for myalgias.  Skin: Negative for rash.  Neurological: Negative for dizziness, tingling, tremors, weakness and headaches.      Objective  Vitals:   12/01/16 1533 12/01/16 1556  BP: (!) 160/83 (!) 170/80  Pulse: 70 76  Resp: 16   Temp: 97.4 F (36.3 C)   TempSrc: Oral   Weight: 222 lb (100.7 kg)   Height: _0  (1.753 m)     Physical Exam  Constitutional: She is oriented to person, place, and time and well-developed, well-nourished, and in no distress. No distress.  HENT:  Head: Normocephalic and atraumatic.  Eyes: Conjunctivae and EOM are normal. Pupils are equal, round, and reactive to light. No scleral icterus.  Neck: Normal range of motion. Neck supple. Carotid bruit is not present. No thyromegaly present.  Cardiovascular: Normal rate and regular rhythm.  Exam reveals no gallop and no friction rub.   Murmur heard.  Systolic murmur is present with a grade of 2/6  throughout  Pulmonary/Chest: Effort normal and breath sounds normal. No respiratory distress. She has no wheezes. She has no rales.  Musculoskeletal: She exhibits no edema.  Lymphadenopathy:    She has no cervical adenopathy.  Neurological: She is alert and oriented to person, place, and time.  Vitals reviewed.      No results found for this or any previous visit (from the past 2160 hour(s)).   Assessment & Plan  Problem List Items Addressed This Visit       Cardiovascular and Mediastinum   Essential hypertension - Primary   Relevant Medications   carvedilol (COREG) 25 MG tablet   Other Relevant Orders   COMPLETE METABOLIC PANEL WITH GFR   HgB A1c     Respiratory   Chronic obstructive pulmonary disease with acute exacerbation (HCC)   Relevant Medications   albuterol (PROVENTIL) (2.5 MG/3ML) 0.083% nebulizer solution     Endocrine   Diabetes mellitus, type 2 (HCC)   Relevant Medications   mupirocin ointment (BACTROBAN) 2 %     Musculoskeletal and Integument   DDD (degenerative disc disease), lumbar   Relevant Medications   gabapentin (NEURONTIN) 800 MG tablet  Genitourinary   [name] (chronic kidney disease)   Relevant Orders   CBC with Differential     Other   Hypercholesteremia   Relevant Medications   carvedilol (COREG) 25 MG tablet   Other Relevant Orders   Lipid Profile      Meds ordered this encounter  Medications  . carvedilol (COREG) 25 MG tablet    Sig: Take 1 tablet (25 mg total) by mouth 2 (two) times daily.    Dispense:  180 tablet    Refill:  3    This prescription was filled today(11/20/2015). Any refills authorized will be placed on file.  Marland Kitchen albuterol (PROVENTIL) (2.5 MG/3ML) 0.083% nebulizer solution    Sig: Take 3 mLs (2.5 mg total) by nebulization every 6 (six) hours as needed for wheezing.    Dispense:  180 vial    Refill:  12  . gabapentin (NEURONTIN) 800 MG tablet    Sig: Take 1 tablet (800 mg total) by mouth 3 (three) times daily.    Dispense:  270 tablet    Refill:  3  . mupirocin ointment (BACTROBAN) 2 %    Sig: One application as directed three times a day.    Dispense:  22 g    Refill:  3   1. Essential hypertension  - carvedilol (COREG) 25 MG tablet; Take 1 tablet (25 mg total) by mouth 2 (two) times daily.  Dispense: 180 tablet; Refill: 3 - COMPLETE METABOLIC PANEL WITH GFR - HgB A1c  2. Chronic obstructive pulmonary disease with acute exacerbation (HCC)  - albuterol (PROVENTIL)  (2.5 MG/3ML) 0.083% nebulizer solution; Take 3 mLs (2.5 mg total) by nebulization every 6 (six) hours as needed for wheezing.  Dispense: 180 vial; Refill: 12  3. Type 2 diabetes mellitus with diabetic nephropathy, without long-term current use of insulin (HCC)  - mupirocin ointment (BACTROBAN) 2 %; One application as directed three times a day.  Dispense: 22 g; Refill: 3  4. DDD (degenerative disc disease), lumbar  - gabapentin (NEURONTIN) 800 MG tablet; Take 1 tablet (800 mg total) by mouth 3 (three) times daily.  Dispense: 270 tablet; Refill: 3  5. Hypercholesteremia  - Lipid Profile  6. Stage 2 chronic kidney disease  - CBC with Differential

## 2016-12-03 ENCOUNTER — Other Ambulatory Visit: Payer: Commercial Managed Care - HMO

## 2016-12-03 LAB — COMPLETE METABOLIC PANEL WITH GFR
ALBUMIN: 3.4 g/dL — AB (ref 3.6–5.1)
ALK PHOS: 47 U/L (ref 33–130)
ALT: 15 U/L (ref 6–29)
AST: 12 U/L (ref 10–35)
BILIRUBIN TOTAL: 0.5 mg/dL (ref 0.2–1.2)
BUN: 23 mg/dL (ref 7–25)
CALCIUM: 8.9 mg/dL (ref 8.6–10.4)
CO2: 30 mmol/L (ref 20–31)
CREATININE: 1.31 mg/dL — AB (ref 0.60–0.93)
Chloride: 105 mmol/L (ref 98–110)
GFR, Est African American: 46 mL/min — ABNORMAL LOW (ref >=60)
GFR, Est Non African American: 40 mL/min — ABNORMAL LOW (ref >=60)
Glucose, Bld: 139 mg/dL — ABNORMAL HIGH (ref 65–99)
Potassium: 4.4 mmol/L (ref 3.5–5.3)
SODIUM: 142 mmol/L (ref 135–146)
TOTAL PROTEIN: 5.9 g/dL — AB (ref 6.1–8.1)

## 2016-12-03 LAB — LIPID PANEL
CHOLESTEROL: 154 mg/dL (ref <200)
HDL: 74 mg/dL (ref >50)
LDL CALC: 62 mg/dL (ref <100)
TRIGLYCERIDES: 90 mg/dL (ref <150)
Total CHOL/HDL Ratio: 2.1 Ratio (ref <5.0)
VLDL: 18 mg/dL (ref <30)

## 2016-12-03 LAB — CBC WITH DIFFERENTIAL/PLATELET
BASOS ABS: 108 {cells}/uL (ref 0–200)
Basophils Relative: 1 %
EOS PCT: 3 %
Eosinophils Absolute: 324 cells/uL (ref 15–500)
HCT: 38.6 % (ref 35.0–45.0)
HEMOGLOBIN: 12.1 g/dL (ref 11.7–15.5)
LYMPHS ABS: 2808 {cells}/uL (ref 850–3900)
LYMPHS PCT: 26 %
MCH: 28.2 pg (ref 27.0–33.0)
MCHC: 31.3 g/dL — AB (ref 32.0–36.0)
MCV: 90 fL (ref 80.0–100.0)
MONOS PCT: 8 %
MPV: 11.1 fL (ref 7.5–12.5)
Monocytes Absolute: 864 cells/uL (ref 200–950)
NEUTROS PCT: 62 %
Neutro Abs: 6696 cells/uL (ref 1500–7800)
Platelets: 190 10*3/uL (ref 140–400)
RBC: 4.29 MIL/uL (ref 3.80–5.10)
RDW: 13.6 % (ref 11.0–15.0)
WBC: 10.8 10*3/uL (ref 3.8–10.8)

## 2016-12-04 LAB — HEMOGLOBIN A1C
Hgb A1c MFr Bld: 6.4 % — ABNORMAL HIGH (ref <5.7)
Mean Plasma Glucose: 137 mg/dL

## 2016-12-08 DIAGNOSIS — G4733 Obstructive sleep apnea (adult) (pediatric): Secondary | ICD-10-CM | POA: Diagnosis not present

## 2016-12-09 ENCOUNTER — Other Ambulatory Visit: Payer: Self-pay | Admitting: Family Medicine

## 2016-12-09 DIAGNOSIS — J449 Chronic obstructive pulmonary disease, unspecified: Secondary | ICD-10-CM | POA: Diagnosis not present

## 2016-12-09 DIAGNOSIS — J438 Other emphysema: Secondary | ICD-10-CM | POA: Diagnosis not present

## 2016-12-14 ENCOUNTER — Telehealth: Payer: Self-pay | Admitting: *Deleted

## 2016-12-14 DIAGNOSIS — G894 Chronic pain syndrome: Secondary | ICD-10-CM | POA: Diagnosis not present

## 2016-12-14 DIAGNOSIS — Z79891 Long term (current) use of opiate analgesic: Secondary | ICD-10-CM | POA: Diagnosis not present

## 2016-12-14 DIAGNOSIS — M150 Primary generalized (osteo)arthritis: Secondary | ICD-10-CM | POA: Diagnosis not present

## 2016-12-14 DIAGNOSIS — F1120 Opioid dependence, uncomplicated: Secondary | ICD-10-CM | POA: Diagnosis not present

## 2016-12-14 DIAGNOSIS — M5430 Sciatica, unspecified side: Secondary | ICD-10-CM | POA: Diagnosis not present

## 2016-12-14 NOTE — Telephone Encounter (Signed)
Patient was seen at pain clinic today. She reports bp 179/99 and was concerned. Patient wants to know what she needs to do?

## 2016-12-14 NOTE — Telephone Encounter (Signed)
Called patient she recheck bp when she got home. 140/79 patient advised to continue to bp daily. If it continue to go up she will increase Lisinopril to 40 mg.

## 2016-12-14 NOTE — Telephone Encounter (Signed)
Go ahead and increase Lisinopril 20 mg to 2 tabs a day.  Megan Bolton, go ahead and send in Lisinopril 40 mg., 1 a day.  (#30/ 6 refills).  .  Cont other meds.-jh

## 2016-12-16 ENCOUNTER — Encounter: Payer: Self-pay | Admitting: Family Medicine

## 2016-12-16 ENCOUNTER — Ambulatory Visit (INDEPENDENT_AMBULATORY_CARE_PROVIDER_SITE_OTHER): Payer: Commercial Managed Care - HMO | Admitting: Family Medicine

## 2016-12-16 VITALS — BP 161/69 | HR 60 | Temp 98.2°F | Resp 16 | Ht 69.0 in | Wt 222.0 lb

## 2016-12-16 DIAGNOSIS — N390 Urinary tract infection, site not specified: Secondary | ICD-10-CM | POA: Diagnosis not present

## 2016-12-16 LAB — POCT URINALYSIS DIPSTICK
BILIRUBIN UA: NEGATIVE
Blood, UA: NEGATIVE
Glucose, UA: NEGATIVE
KETONES UA: NEGATIVE
LEUKOCYTES UA: NEGATIVE
Nitrite, UA: POSITIVE
SPEC GRAV UA: 1.01
Urobilinogen, UA: 0.2
pH, UA: 6.5

## 2016-12-16 MED ORDER — CEPHALEXIN 500 MG PO CAPS: 500.0000 mg | ORAL_CAPSULE | Freq: Two times a day (BID) | ORAL | 0 refills | Status: DC

## 2016-12-16 NOTE — Patient Instructions (Signed)
Thank you for coming in to clinic today.  1. You have a Urinary Tract Infection - Start Keflex 500mg  2 times daily for next 7 days, complete entire course, even if feeling better - We sent urine for a culture, we will call you within next few days if we need to change antibiotics - Please drink plenty of fluids, improve hydration over next 1 week  Stop taking Doxycycline while on Keflex. Once finished 1 week of Keflex, then resume Doxycycline  Follow-up with Urology Dr Jacqlyn Larsen as planned,  Or sooner if not improving  If symptoms worsening, developing nausea / vomiting, worsening back pain, fevers / chills / sweats, then please return for re-evaluation sooner.  Please schedule a follow-up appointment with Dr. Parks Ranger in 1-2 weeks as needed for UTI if not improved  If you have any other questions or concerns, please feel free to call the clinic or send a message through [place]. You may also schedule an earlier appointment if necessary.  Nobie Putnam, DO [place]

## 2016-12-16 NOTE — Progress Notes (Signed)
Subjective:    Patient ID: Megan Bolton, female    DOB: 1941/12/22, 75 y.o.   MRN: [id number]  Megan Bolton is a 75 y.o. female presenting on 12/16/2016 for Urinary Tract Infection  Patient presents for a same day appointment. History provided by patient and also daughter, Annell Greening present.  HPI  UTI Reports symptoms started 2 days ago with "not feeling well", onset overnight felt urinary urgency and nocturia would void every 2 hours. Similar to previous UTI, except not severe compared to prior UTI. - Followed by Urology [facility] Dr Jacqlyn Larsen), has been on Doxycycline 100mg  for UTI prophylaxis for past 6 months and has not had a UTI, next follow-up 01/19/17 - History of allergic reaction to Macrobid (x2 reactions), caused respiratory failure and dyspnea. Has tolerated Ceftin before. - Admits mild child this morning, mild urinary odor - Denies any fevers, sweats, muscle aches, back or flank pain, abdominal pain, hematuria, urinary color change   Social History  Substance Use Topics  . Smoking status: Former Smoker    Packs/day: 0.50    Quit date: 04/28/2015  . Smokeless tobacco: Never Used  . Alcohol use No    Review of Systems Per HPI unless specifically indicated above     Objective:    BP (!) 161/69 (BP Location: Left Arm, Patient Position: Sitting, Cuff Size: Large)   Pulse 60   Temp 98.2 F (36.8 C) (Oral)   Resp 16   Ht 5\' 9"  (1.753 m)   Wt 222 lb (100.7 kg)   SpO2 96%   BMI 32.78 kg/m   Wt Readings from Last 3 Encounters:  12/16/16 222 lb (100.7 kg)  12/01/16 222 lb (100.7 kg)  08/25/16 237 lb (107.5 kg)    Physical Exam  Constitutional: She is oriented to person, place, and time. She appears well-developed and well-nourished. No distress.  Well-appearing, comfortable, cooperative  HENT:  Head: Normocephalic and atraumatic.  Mouth/Throat: Oropharynx is clear and moist.  On nasal canal portable oxygen  Cardiovascular: Normal rate and intact distal pulses.     Pulmonary/Chest: Breath sounds normal. No respiratory distress. She has no wheezes. She has no rales.  Mild diffuse reduced air movement, without coarse sounds or wheezing.  Abdominal: Soft. She exhibits no distension. There is no tenderness.  Musculoskeletal: She exhibits no edema.  Neurological: She is alert and oriented to person, place, and time.  Skin: Skin is warm and dry. She is not diaphoretic.  Psychiatric: Her behavior is normal.  Nursing note and vitals reviewed.  Results for orders placed or performed in visit on 12/16/16  POCT urinalysis dipstick  Result Value Ref Range   Color, UA straw    Clarity, UA cloudy    Glucose, UA neg    Bilirubin, UA neg    Ketones, UA neg    Spec Grav, UA 1.010    Blood, UA neg    pH, UA 6.5    Protein, UA trace    Urobilinogen, UA 0.2    Nitrite, UA positive    Leukocytes, UA Negative Negative      Assessment & Plan:   Problem List Items Addressed This Visit    None    Visit Diagnoses    Urinary tract infection without hematuria, site unspecified    -  Primary  Clinically consistent with early cystitis / UTI and confirmed on UA with nitrite positive (however leuks neg). No recent UTIs in past 6 months, but complicated patient, followed by [facility] (  Dr Jacqlyn Larsen) with recurrent UTI and has been on antibiotic prophylaxis with Doxycycline 100mg  daily for past 6 months. No concern for pyelo today (no systemic symptoms, neg fever, back pain, n/v).  Plan: 1. Urinalysis, positive nitrite 2. Ordered Urine culture - follow-up 3. HOLD Doxycycline 100mg  daily UTI prophylaxis for 7 days while taking new Keflex 500mg  BID x 7 days (based on CrCl / GFR), resume doxy after finish keflex. Note I reviewed prior Urine cultures x 2 with E Coli and sensitive to cephalosporin, and resistant to Cipro, Bactrim 4. Improve PO hydration 5. RTC if no improvement 1-2 weeks, red flags given to return sooner     Relevant Medications   cephALEXin (KEFLEX) 500  MG capsule   Other Relevant Orders   POCT urinalysis dipstick (Completed)   Urine Culture      Meds ordered this encounter  Medications  . cephALEXin (KEFLEX) 500 MG capsule    Sig: Take 1 capsule (500 mg total) by mouth 2 (two) times daily. For 1 week, stop Doxycycline while on this antibiotic, resume after completed    Dispense:  14 capsule    Refill:  0      Follow up plan: Return in about 2 weeks (around 12/30/2016), or if symptoms worsen or fail to improve, for UTI.  Nobie Putnam, [facility] 12/16/2016, 1:26 PM

## 2016-12-18 LAB — URINE CULTURE: Colony Count: >=100000

## 2016-12-20 DIAGNOSIS — J449 Chronic obstructive pulmonary disease, unspecified: Secondary | ICD-10-CM | POA: Diagnosis not present

## 2016-12-24 ENCOUNTER — Encounter: Payer: Self-pay | Admitting: Emergency Medicine

## 2016-12-24 ENCOUNTER — Ambulatory Visit
Admission: RE | Admit: 2016-12-24 | Discharge: 2016-12-24 | Disposition: A | Discharge status: Home / Self Care | Payer: Commercial Managed Care - HMO | Source: Ambulatory Visit | Attending: Family Medicine | Admitting: Family Medicine

## 2016-12-24 ENCOUNTER — Emergency Department
Admission: EM | Admit: 2016-12-24 | Discharge: 2016-12-25 | Disposition: A | Discharge status: Home / Self Care | Payer: Commercial Managed Care - HMO | Attending: Emergency Medicine | Admitting: Emergency Medicine

## 2016-12-24 ENCOUNTER — Ambulatory Visit (INDEPENDENT_AMBULATORY_CARE_PROVIDER_SITE_OTHER): Payer: Commercial Managed Care - HMO | Admitting: Family Medicine

## 2016-12-24 ENCOUNTER — Encounter: Payer: Self-pay | Admitting: Family Medicine

## 2016-12-24 VITALS — BP 164/82 | HR 64 | Temp 98.7°F | Resp 14 | Ht 69.0 in

## 2016-12-24 DIAGNOSIS — R0602 Shortness of breath: Secondary | ICD-10-CM

## 2016-12-24 DIAGNOSIS — N189 Chronic kidney disease, unspecified: Secondary | ICD-10-CM | POA: Diagnosis not present

## 2016-12-24 DIAGNOSIS — I110 Hypertensive heart disease with heart failure: Secondary | ICD-10-CM | POA: Diagnosis not present

## 2016-12-24 DIAGNOSIS — J449 Chronic obstructive pulmonary disease, unspecified: Secondary | ICD-10-CM | POA: Insufficient documentation

## 2016-12-24 DIAGNOSIS — N341 Nonspecific urethritis: Secondary | ICD-10-CM | POA: Diagnosis not present

## 2016-12-24 DIAGNOSIS — Z9981 Dependence on supplemental oxygen: Secondary | ICD-10-CM | POA: Diagnosis not present

## 2016-12-24 DIAGNOSIS — Z87891 Personal history of nicotine dependence: Secondary | ICD-10-CM | POA: Insufficient documentation

## 2016-12-24 DIAGNOSIS — N342 Other urethritis: Secondary | ICD-10-CM

## 2016-12-24 DIAGNOSIS — Z7984 Long term (current) use of oral hypoglycemic drugs: Secondary | ICD-10-CM | POA: Insufficient documentation

## 2016-12-24 DIAGNOSIS — J9611 Chronic respiratory failure with hypoxia: Secondary | ICD-10-CM

## 2016-12-24 DIAGNOSIS — R05 Cough: Secondary | ICD-10-CM | POA: Diagnosis not present

## 2016-12-24 DIAGNOSIS — I130 Hypertensive heart and chronic kidney disease with heart failure and stage 1 through stage 4 chronic kidney disease, or unspecified chronic kidney disease: Secondary | ICD-10-CM | POA: Insufficient documentation

## 2016-12-24 DIAGNOSIS — E1122 Type 2 diabetes mellitus with diabetic chronic kidney disease: Secondary | ICD-10-CM | POA: Diagnosis not present

## 2016-12-24 DIAGNOSIS — J432 Centrilobular emphysema: Secondary | ICD-10-CM | POA: Diagnosis not present

## 2016-12-24 DIAGNOSIS — Z79899 Other long term (current) drug therapy: Secondary | ICD-10-CM | POA: Diagnosis not present

## 2016-12-24 DIAGNOSIS — I509 Heart failure, unspecified: Secondary | ICD-10-CM | POA: Diagnosis not present

## 2016-12-24 LAB — URINALYSIS, COMPLETE (UACMP) WITH MICROSCOPIC
Bilirubin Urine: NEGATIVE
Glucose, UA: >=500 mg/dL — AB
Hgb urine dipstick: NEGATIVE
Ketones, ur: NEGATIVE mg/dL
Leukocytes, UA: NEGATIVE
NITRITE: POSITIVE — AB
Protein, ur: NEGATIVE mg/dL
RBC / HPF: NONE SEEN RBC/hpf (ref 0–5)
SPECIFIC GRAVITY, URINE: 1.007 (ref 1.005–1.030)
pH: 7 (ref 5.0–8.0)

## 2016-12-24 LAB — COMPREHENSIVE METABOLIC PANEL
ALBUMIN: 3.6 g/dL (ref 3.5–5.0)
ALT: 31 U/L (ref 14–54)
ANION GAP: 8 (ref 5–15)
AST: 22 U/L (ref 15–41)
Alkaline Phosphatase: 52 U/L (ref 38–126)
BILIRUBIN TOTAL: 1.1 mg/dL (ref 0.3–1.2)
BUN: 20 mg/dL (ref 6–20)
CALCIUM: 8.9 mg/dL (ref 8.9–10.3)
CO2: 29 mmol/L (ref 22–32)
Chloride: 101 mmol/L (ref 101–111)
Creatinine, Ser: 1.07 mg/dL — ABNORMAL HIGH (ref 0.44–1.00)
GFR calc Af Amer: 57 mL/min — ABNORMAL LOW (ref >60)
GFR, EST NON AFRICAN AMERICAN: 49 mL/min — AB (ref >60)
GLUCOSE: 267 mg/dL — AB (ref 65–99)
Potassium: 3.9 mmol/L (ref 3.5–5.1)
Sodium: 138 mmol/L (ref 135–145)
TOTAL PROTEIN: 6.8 g/dL (ref 6.5–8.1)

## 2016-12-24 LAB — CBC WITH DIFFERENTIAL/PLATELET
BASOS PCT: 1 %
Basophils Absolute: 0.1 10*3/uL (ref 0–0.1)
Eosinophils Absolute: 0 10*3/uL (ref 0–0.7)
Eosinophils Relative: 0 %
HEMATOCRIT: 37.4 % (ref 35.0–47.0)
HEMOGLOBIN: 12.3 g/dL (ref 12.0–16.0)
LYMPHS ABS: 0.7 10*3/uL — AB (ref 1.0–3.6)
LYMPHS PCT: 8 %
MCH: 29.4 pg (ref 26.0–34.0)
MCHC: 33 g/dL (ref 32.0–36.0)
MCV: 89.1 fL (ref 80.0–100.0)
MONOS PCT: 1 %
Monocytes Absolute: 0.1 10*3/uL — ABNORMAL LOW (ref 0.2–0.9)
NEUTROS ABS: 7.9 10*3/uL — AB (ref 1.4–6.5)
NEUTROS PCT: 90 %
Platelets: 196 10*3/uL (ref 150–440)
RBC: 4.2 MIL/uL (ref 3.80–5.20)
RDW: 14.2 % (ref 11.5–14.5)
WBC: 8.8 10*3/uL (ref 3.6–11.0)

## 2016-12-24 LAB — BRAIN NATRIURETIC PEPTIDE: B Natriuretic Peptide: 410 pg/mL — ABNORMAL HIGH (ref 0.0–100.0)

## 2016-12-24 LAB — TROPONIN I
TROPONIN I: 0.03 ng/mL — AB (ref <0.03)
Troponin I: 0.03 ng/mL (ref <0.03)

## 2016-12-24 MED ORDER — FUROSEMIDE 10 MG/ML IJ SOLN
20.0000 mg | Freq: Once | INTRAMUSCULAR | Status: AC
  Administered 2016-12-24: 20 mg via INTRAVENOUS
  Filled 2016-12-24: qty 4

## 2016-12-24 MED ORDER — ALBUTEROL SULFATE (2.5 MG/3ML) 0.083% IN NEBU
5.0000 mg | INHALATION_SOLUTION | Freq: Once | RESPIRATORY_TRACT | Status: AC
  Administered 2016-12-24: 5 mg via RESPIRATORY_TRACT
  Filled 2016-12-24: qty 6

## 2016-12-24 MED ORDER — CEFTRIAXONE SODIUM 1 G IJ SOLR: 1.0000 g | Freq: Once | INTRAMUSCULAR | Status: DC

## 2016-12-24 MED ORDER — METHYLPREDNISOLONE SODIUM SUCC 125 MG IJ SOLR
125.0000 mg | Freq: Once | INTRAMUSCULAR | Status: AC
  Administered 2016-12-24: 125 mg via INTRAVENOUS
  Filled 2016-12-24: qty 2

## 2016-12-24 MED ORDER — AMOXICILLIN-POT CLAVULANATE 875-125 MG PO TABS: 1.0000 | ORAL_TABLET | Freq: Two times a day (BID) | ORAL | 0 refills | Status: DC

## 2016-12-24 MED ORDER — CEFTRIAXONE SODIUM-DEXTROSE 1-3.74 GM-% IV SOLR
1.0000 g | Freq: Once | INTRAVENOUS | Status: AC
  Administered 2016-12-24: 1 g via INTRAVENOUS
  Filled 2016-12-24: qty 50

## 2016-12-24 MED ORDER — IPRATROPIUM BROMIDE 0.02 % IN SOLN
0.5000 mg | Freq: Once | RESPIRATORY_TRACT | Status: AC
  Administered 2016-12-24: 0.5 mg via RESPIRATORY_TRACT
  Filled 2016-12-24: qty 2.5

## 2016-12-24 MED ORDER — PREDNISONE 20 MG PO TABS: ORAL_TABLET | ORAL | 0 refills | Status: DC

## 2016-12-24 NOTE — ED Provider Notes (Signed)
[facility] Provider Note   CSN: [id number] Arrival date & time: 12/24/16  1945     History   Chief Complaint Chief Complaint  Patient presents with  . Shortness of Breath  . Headache  . Blurred Vision    HPI Megan Bolton is a 75 y.o. female hx of COPD, DM, HL, HTN, Here presenting with shortness of breath, cough. Patient has dry cough for the last several days and has worsening shortness of breath. Patient denies any fevers or chills. Patient has some bilateral leg swelling as well. Patient went to see primary care doctor and was diagnosed with COPD exacerbation. She was prescribed prednisone. A CXR was performed outpatient and showed possible CHF and was subsequently prescribed lasix. Patient has no known CHF or CAD or stents. Patient is on 2 L [place] at baseline. During this afternoon, she had some chest pressure on the left side and was concerned about her breathing. Has been having intermittent headaches for several days but no blurry vision or vomiting.    The history is provided by the patient.    Past Medical History:  Diagnosis Date  . COPD (chronic obstructive pulmonary disease) ([place])   . Diabetes mellitus without complication ([place])   . Hyperlipidemia   . Hypertension     Patient Active Problem List   Diagnosis Date Noted  . Chronic respiratory failure with hypoxia, on home O2 therapy ([place]) 12/24/2016  . Incomplete emptying of bladder 07/19/2016  . Bacteriuria 07/17/2016  . Infection due to ESBL-producing Escherichia coli 04/21/2016  . Frequent UTI 04/21/2016  . Sepsis ([place]) 04/18/2016  . UTI (lower urinary tract infection) 04/18/2016  . Type 2 diabetes mellitus ([place]) 04/18/2016  . HTN (hypertension) 04/18/2016  . OSA on CPAP 04/18/2016  . Bladder infection, chronic 04/17/2016  . Calculus of kidney 04/17/2016  . Community acquired pneumonia 02/28/2016  . Pneumonia 02/28/2016  . [name] (chronic kidney disease) 07/12/2015  . Diabetes mellitus,  type 2 ([place]) 06/11/2015  . Arthritis, degenerative 06/11/2015  . Hypercholesteremia 06/11/2015  . BP (high blood pressure) 06/11/2015  . 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 Injury of kidney 04/14/2015  . Essential hypertension 04/14/2015  . Blood glucose elevated 04/14/2015  . Apnea, sleep 04/14/2015  . COPD (chronic obstructive pulmonary disease) ([place]) 04/14/2015  . DDD (degenerative disc disease), lumbar 08/01/2014  . Lumbar canal stenosis 05/11/2014  . Neuritis or radiculitis due to rupture of lumbar intervertebral disc 05/11/2014  . 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 Lumbosacral spondylosis 05/11/2014  . Thoracic and lumbosacral neuritis 05/11/2014  . Accelerating angina ([place]) 03/13/2014    Past Surgical History:  Procedure Laterality Date  . ABDOMINAL HYSTERECTOMY    . SPINE SURGERY      OB History    No data available       Home Medications    Prior to Admission medications   Medication Sig Start Date End Date Taking? Authorizing Provider  albuterol (PROVENTIL) (2.5 MG/3ML) 0.083% nebulizer solution Take 3 mLs (2.5 mg total) by nebulization every 6 (six) hours as needed for wheezing. 12/01/16  Yes Arlis Porta., MD  albuterol (VENTOLIN HFA) 108 (90 Base) MCG/ACT inhaler Inhale 2 puffs into the lungs every 6 (six) hours as needed for wheezing or shortness of breath. 01/30/16  Yes Arlis Porta., MD  carvedilol (COREG) 25 MG tablet Take 1 tablet (25 mg total) by mouth 2 (two) times daily. 12/01/16  Yes Arlis Porta., MD  clonazePAM (KLONOPIN) 0.5 MG tablet 0.5 mg at bedtime.  07/04/15  Yes Historical Provider, MD  doxycycline (VIBRAMYCIN) 100 MG capsule Take 1 capsule by mouth daily.  11/27/16  Yes Historical Provider, MD  Fluticasone-Salmeterol (ADVAIR DISKUS) 250-50 MCG/DOSE AEPB Inhale 1 puff into the lungs 2 (two) times daily. 04/19/16  Yes [place] Lighter, MD  furosemide (LASIX) 20 MG tablet TAKE ONE TABLET BY MOUTH  EVERY DAY 12/09/16  Yes Arlis Porta., MD  gabapentin (NEURONTIN) 800 MG tablet Take 1 tablet (800 mg total) by mouth 3 (three) times daily. 12/01/16  Yes Arlis Porta., MD  lisinopril (PRINIVIL,ZESTRIL) 20 MG tablet Take 1 tablet (20 mg total) by mouth daily. 08/25/16  Yes Arlis Porta., MD  metFORMIN (GLUCOPHAGE) 500 MG tablet Take 1 tablet (500 mg total) by mouth daily with breakfast. 04/21/16  Yes Arlis Porta., MD  montelukast (SINGULAIR) 10 MG tablet Take 1 tablet (10 mg total) by mouth at bedtime. 03/26/16  Yes Arlis Porta., MD  OXYCONTIN 10 MG 12 hr tablet Take 10 mg by mouth 2 (two) times daily.   Yes Historical Provider, MD  OXYGEN Inhale 2 L into the lungs continuous.   Yes Historical Provider, MD  potassium chloride (K-DUR) 10 MEQ tablet Take 1 tablet (10 mEq total) by mouth 2 (two) times daily. 09/09/16  Yes Alexander Devin Going, DO  predniSONE (DELTASONE) 20 MG tablet Take 2 tablets daily (15m) for 7 days, then take 1 tab daily (255m for 7 days. 12/24/16  Yes Alexander J Karamalegos, DO  tiotropium (SPIRIVA HANDIHALER) 18 MCG inhalation capsule Place 1 capsule (18 mcg total) into inhaler and inhale daily. 07/01/16  Yes JaArlis Porta MD  tiZANidine (ZANAFLEX) 4 MG tablet Take 4 mg by mouth daily. 08/12/16  Yes Historical Provider, MD  valACYclovir (VALTREX) 1000 MG tablet Take 1 tablet (1,000 mg total) by mouth daily as needed (for fever blisters). 04/19/16  Yes RaGladstone LighterMD  ACCU-CHEK AVIVA PLUS test strip 1 strip by Other route 2 (two) times daily. 07/15/16   Historical Provider, MD  ACCU-CHEK SOFTCLIX LANCETS lancets 1 each by Other route 2 (two) times daily. 06/17/16   Historical Provider, MD  Blood Glucose Monitoring Suppl (ACCU-CHEK AVIVA CONNECT) w/Device KIT 1 kit by Other route 2 (two) times daily. 03/11/16   JaArlis Porta MD  mupirocin ointment (BACTROBAN) 2 % One application as directed three times a day. 12/01/16   JaArlis Porta MD    Family History Family History  Problem Relation Age of Onset  . Hypertension Paternal Grandfather     Social History Social History  Substance Use Topics  . Smoking status: Former Smoker    Packs/day: 0.50    Quit date: 04/28/2015  . Smokeless tobacco: Never Used  . Alcohol use No     Allergies   Nitrofurantoin   Review of Systems Review of Systems  Respiratory: Positive for shortness of breath.   Neurological: Positive for headaches.  All other systems reviewed and are negative.    Physical Exam Updated Vital Signs BP (!) 161/83 (BP Location: Right Arm)   Pulse 64   Temp 98 F (36.7 C) (Oral)   Resp 16   Ht _0  (1.753 m)   Wt 222 lb (100.7 kg)   SpO2 95%   BMI 32.78 kg/m   Physical Exam  Constitutional: She is oriented to person, place, and time.  Chronically ill, slightly  anxious   HENT:  Head: Normocephalic.  Mouth/Throat: Oropharynx is clear and moist.  Eyes: EOM are normal. Pupils are equal, round, and reactive to light.  Neck: Normal range of motion. Neck supple.  Cardiovascular: Normal rate, regular rhythm and normal heart sounds.   Pulmonary/Chest:  Diminished throughout, minimal crackles bilateral bases   Abdominal: Soft. Bowel sounds are normal.  Musculoskeletal: Normal range of motion.  1+ edema   Neurological: She is alert and oriented to person, place, and time. No cranial nerve deficit. Coordination normal.  Skin: Skin is warm.  Psychiatric: She has a normal mood and affect.  Nursing note and vitals reviewed.    ED Treatments / Results  Labs (all labs ordered are listed, but only abnormal results are displayed) Labs Reviewed  BRAIN NATRIURETIC PEPTIDE - Abnormal; Notable for the following:       Result Value   B Natriuretic Peptide 410.0 (*)    All other components within normal limits  CBC WITH DIFFERENTIAL/PLATELET - Abnormal; Notable for the following:    Neutro Abs 7.9 (*)    Lymphs Abs 0.7 (*)    Monocytes  Absolute 0.1 (*)    All other components within normal limits  COMPREHENSIVE METABOLIC PANEL - Abnormal; Notable for the following:    Glucose, Bld 267 (*)    Creatinine, Ser 1.07 (*)    GFR calc non Af Amer 49 (*)    GFR calc Af Amer 57 (*)    All other components within normal limits  TROPONIN I - Abnormal; Notable for the following:    Troponin I 0.03 (*)    All other components within normal limits  URINALYSIS, COMPLETE (UACMP) WITH MICROSCOPIC - Abnormal; Notable for the following:    Color, Urine YELLOW (*)    APPearance CLEAR (*)    Glucose, UA >=500 (*)    Nitrite POSITIVE (*)    Bacteria, UA MANY (*)    Squamous Epithelial / LPF 0-5 (*)    All other components within normal limits  URINE CULTURE  TROPONIN I    EKG  EKG Interpretation None      ED ECG REPORT I, Wandra Arthurs, the attending physician, personally viewed and interpreted this ECG.   Date: 12/24/2016  EKG Time: 19:53 pm  Rate: 74  Rhythm: normal EKG, normal sinus rhythm  Axis: normal  Intervals:none  ST&T Change: nonspecific     Radiology Dg Chest 2 View  Result Date: 12/24/2016 CLINICAL DATA:  Shortness of breath x 2 days, COPD, smoker, denies asthma, no cough, no pain, no other lung/heart history or surgery per patient EXAM: CHEST  2 VIEW COMPARISON:  04/17/2016 FINDINGS: The cardiac silhouette is mildly enlarged. No mediastinal or hilar masses or convincing adenopathy. Lungs are hyperexpanded with irregularly thickened interstitial markings as well as vascular congestion. No focal lung consolidation. No convincing pleural effusion. No pneumothorax. Skeletal structures are demineralized but intact. IMPRESSION: 1. Irregularly thickened interstitial markings, which have increased when compared to the prior exam, along with vascular congestion. CHF with mild interstitial edema is suspected superimposed on chronic interstitial thickening. Electronically Signed   By: Lajean Manes M.D.   On: 12/24/2016  13:45    Procedures Procedures (including critical care time)  Medications Ordered in ED Medications  methylPREDNISolone sodium succinate (SOLU-MEDROL) 125 mg/2 mL injection 125 mg (125 mg Intravenous Given 12/24/16 2030)  albuterol (PROVENTIL) (2.5 MG/3ML) 0.083% nebulizer solution 5 mg (5 mg Nebulization Given 12/24/16 2030)  ipratropium (ATROVENT) nebulizer solution  0.5 mg (0.5 mg Nebulization Given 12/24/16 2030)  furosemide (LASIX) injection 20 mg (20 mg Intravenous Given 12/24/16 2118)  cefTRIAXone (ROCEPHIN) IVPB 1 g (1 g Intravenous Given 12/24/16 2233)     Initial Impression / Assessment and Plan / ED Course  I have reviewed the triage vital signs and the nursing notes.  Pertinent labs & imaging results that were available during my care of the patient were reviewed by me and considered in my medical decision making (see chart for details).  Clinical Course     Dacey Clayton Bibles V[place]aggio is a 75 y.o. female here with SOB. She is doing well on 2 L [place]. Consider COPD vs CHF, less likely ACS. Will get labs, Trop x 2, BNP. CXR outpatient done earlier and showed mild CHF so won't need to repeat. I doubt PE or dissection.   11:25 PM Labs showed BNP 400. Trop 0.03. UA + nitrate and bacteria. Given rocephin, solumedrol, nebs, and lasix and felt better. Repeat trop pending. If stable or trending neg, anticipate dc home with augmentin. Recent Urine culture does show ESBL and she is on doxycyline already so will try agumentin (which it is sensitive)   Final Clinical Impressions(s) / ED Diagnoses   Final diagnoses:  None    New Prescriptions New Prescriptions   No medications on file     Drenda Freeze, MD 12/24/16 2328

## 2016-12-24 NOTE — ED Triage Notes (Signed)
Pt presents to ED with shortness of breath for the past 2 days with weakness and headache. Pt states she has been feeling "off" all week but worsened the past 2 days. Seen by pcp today with current complaints and chest xray performed. Pt was told she had fluid around her heart with no hx of the same and was told to take lasix and prednisone. Has taken one dose of each prior to arrival. Not feel any better. On 2L of O2 at home. Pt states she just wants to know what is wrong with her.

## 2016-12-24 NOTE — Assessment & Plan Note (Signed)
Secondary to severe COPD with hypoxia on supplemental O2 2L continuous, benefiting from therapy Now worsening dyspnea recently, unclear exact etiology - Cover for possible COPD with prednisone course, follow-up with Pulm - Consider possible respiratory depression on changed 12 hr long acting oxycontin from pain management, now off of this temporarily - Also CXR today shows concern for possible vascular congestion, maybe early evidence of some CHF as factor as well, however not clinically volume overloaded, advised to increase Lasix to 40mg  for 3-4 days then resume 20mg  daily and contact [facility] to follow-up as soon as possible for further work-up

## 2016-12-24 NOTE — Patient Instructions (Signed)
Thank you for coming in to clinic today.  1. Start prednisone 2 tabs for 40mg  daily for 7 days, then reduce to 1 tab for 7 days or until you see Pulmonology - May hold new pain med Oxycontin for now, contact them in next week to discuss may need to switch back to other oxycodone medication if this was better 2. Waiting on Chest x-ray results will call you or send a MyChart message with results if shows pneumonia (early) would start Ceftin or antibiotic - Otherwise just take the prednisone 3. May contact pulmonology next week if needed to notify them maybe they can see you sooner  If significant worsening shortness of breath, chest pain, or low oxygen productive cough and fevers, or other symptoms may need to go directly to hospital Emergency Dept for further evaluation.  Please schedule a follow-up appointment with Dr. Parks Ranger in 1-2 weeks as needed for dyspnea COPD if not improved  If you have any other questions or concerns, please feel free to call the clinic or send a message through [place]. You may also schedule an earlier appointment if necessary.  Nobie Putnam, DO [place]

## 2016-12-24 NOTE — Assessment & Plan Note (Signed)
Concern for possible dyspnea secondary to COPD with possible mild acute exacerbation however no bronchitis or wheezing on exam. Could be progression of problem. Also differential includes recent change to longer acting opiate oxycontin with respiratory depression - No hypoxia (94% on 2L continuous home O2), afebrile, no recent hospitalization  Plan: 1. Start Prednisone 40mg  x 7 days then 20mg  x 7 days - last up to 2 weeks until sees Pulmonology 01/07/17, discuss further with them may call next week for update 2. Check CXR today, reviewed results with increased vascular congestion concern for possible CHF with chronic interstitial thickening - called patient's daughter to review these X-ray results today, advised to increase lasix from 20mg  daily to 40mg  daily for 3-4 days, then resume 20, and take prednisone as prescribed, hold oxycontin for another 1-2 days, can cut in half for less dose if needed, follow-up with pain management for further advice on opiates, concern some respiratory depression, and advised her to schedule close follow-up with [facility] cardiology for further discussion on possible CHF diagnosis and management 3. Follow-up as scheduled with Pulmonology, return criteria reviewed if worsening, hold antibiotics at this time  x 5 day steroid burst 2. Use albuterol q 4 hr regularly x 2-3 days. Continue maintenance inhalers 3. Considered antibiotics, however afebrile and no hypoxia, would not be indicated in mild AECOPD, unless recurrent or not improved on steroids 4. RTC about 1 week if not improving, otherwise strict return criteria to go to ED  Secondary to severe COPD with hypoxia on supplemental O2 2L continuous, benefiting from therapy Now worsening dyspnea recently, unclear exact etiology - Cover for possible COPD with prednisone course, follow-up with Pulm - Consider possible respiratory depression on changed 12 hr long acting oxycontin from pain management, now off of this  temporarily - Also CXR today shows concern for possible vascular congestion, maybe early evidence of some CHF as factor as well, however not clinically volume overloaded, advised to increase Lasix to 40mg  for 3-4 days then resume 20mg  daily and contact [facility] to follow-up as soon as possible for further work-up

## 2016-12-24 NOTE — ED Notes (Signed)
Assisted pt to bathroom

## 2016-12-24 NOTE — ED Notes (Addendum)
Pt c/o shortness of breath and weakness - states worse over the past week - she is on o2 at all times - pt went to PCP today and was told she had mild CHF with COPD exacerbation - MD at bedside now evaluating pt - pt reports that she started having chest pain with a "quiver" yesterday

## 2016-12-24 NOTE — Progress Notes (Signed)
Subjective:    Patient ID: Megan Bolton, female    DOB: 1941/06/28, 75 y.o.   MRN: [id number]  Megan Bolton is a 74 y.o. female presenting on 12/24/2016 for Shortness of Breath (onset 3 days)  Patient presents for a same day appointment. History provided by patient, also daughter (primary caregiver, Annell Greening) is present.  HPI   SHORTNESS OF BREATH, in setting of Chronic COPD with chronic respiratory failure on supplemental O2 - Reports recent history she had been doing well, see prior visit 12/20 for UTI treated with Keflex empirically but urine culture grew E Coli with ESBL resistant to most antibiotics including Keflex, however her Urinary symptoms have resolved. - Now patient worked in today for visit for shortness of breath for past 3 days, worse with exertion with ambulation short distance, and occasionally at rest, not associated with any chest pain or pressure, sweating, chills or productive cough. Admits associated feeling drowsy and groggy as well. Earlier spoke with daughter on phone prior to appointment, she was advised by Pulmonology office to come here since her physician is out of office, there was concern from Pain management physician regarding her current opiate dose and wanted to make sure no pneumonia. Recent med change 3 weeks ago to chronic opiate, was on Oxycodone 5mg  TID, and then recently changed to Oxycontin 10mg  BID (12 hour longer lasting), however she seemed to do well for about 2.5 weeks until now 3 days ago new symptoms. She does have some baseline dyspnea but currently this is worse - No changes otherwise to her oxygen, doing well on current home portable O2 2L continuous, seems to benefit from therapy - Continues followed by Pulmonology [facility] (Dr Raul Del), next apt 01/07/17, last COPD exac flare she was seen in 08/2016 had benefited from prednisone burst/taper - Admits occasional irregular heart palpitations over past 1-2 days - Denies any chest pain or pressure,  fevers/chills, productive cough, wheezing  Social History  Substance Use Topics  . Smoking status: Former Smoker    Packs/day: 0.50    Quit date: 04/28/2015  . Smokeless tobacco: Never Used  . Alcohol use No    Review of Systems Per HPI unless specifically indicated above     Objective:    BP (!) 164/82   Pulse 64   Temp 98.7 F (37.1 C) (Oral)   Resp 14   Ht 5\' 9"  (1.753 m)   SpO2 94%   Wt Readings from Last 3 Encounters:  12/16/16 222 lb (100.7 kg)  12/01/16 222 lb (100.7 kg)  08/25/16 237 lb (107.5 kg)    Physical Exam  Constitutional: She is oriented to person, place, and time. She appears well-developed and well-nourished. No distress.  Chronically ill-appearing but currently seems well, comfortable, cooperative  HENT:  Head: Normocephalic and atraumatic.  Mouth/Throat: Oropharynx is clear and moist.  On portable oxygen 2L  Cardiovascular: Normal rate and intact distal pulses.   Pulmonary/Chest: Effort normal and breath sounds normal. No respiratory distress. She has no wheezes. She has no rales.  Stable mild diffuse reduced air movement but overall lungs are clear without wheezing, coarse sounds or any fine crackles. Speaks full sentences. No increased work of breathing at rest in wheelchair.  Musculoskeletal: She exhibits no edema.  Neurological: She is alert and oriented to person, place, and time.  Skin: Skin is warm and dry. She is not diaphoretic.  Psychiatric: Her behavior is normal.  Nursing note and vitals reviewed.  I have personally reviewed the  radiology report from 12/24/16 Chest X-ray 2v  CLINICAL DATA:  Shortness of breath x 2 days, COPD, smoker, denies asthma, no cough, no pain, no other lung/heart history or surgery per patient  EXAM: CHEST  2 VIEW  COMPARISON:  04/17/2016  FINDINGS: The cardiac silhouette is mildly enlarged. No mediastinal or hilar masses or convincing adenopathy.  Lungs are hyperexpanded with irregularly thickened  interstitial markings as well as vascular congestion. No focal lung consolidation. No convincing pleural effusion. No pneumothorax.  Skeletal structures are demineralized but intact.  IMPRESSION: 1. Irregularly thickened interstitial markings, which have increased when compared to the prior exam, along with vascular congestion. CHF with mild interstitial edema is suspected superimposed on chronic interstitial thickening.   Electronically Signed   By: Lajean Manes M.D.   On: 12/24/2016 13:45  I have personally reviewed the following lab results from 12/16/16.  Results for orders placed or performed in visit on 12/16/16  Urine Culture  Result Value Ref Range   Culture ESCHERICHIA COLI    Colony Count >=100,000 COLONIES/ML    Organism ID, Bacteria ESCHERICHIA COLI       Susceptibility   Escherichia coli -  (no method available)    AMPICILLIN >=32 Resistant     AMOX/CLAVULANIC 4 Sensitive     AMPICILLIN/SULBACTAM 16 Intermediate     PIP/TAZO <=4 Sensitive     IMIPENEM <=0.25 Sensitive     CEFAZOLIN >=64 Resistant     CEFTRIAXONE >=64 Resistant     CEFTAZIDIME 16 Resistant     CEFEPIME  Resistant     GENTAMICIN <=1 Sensitive     TOBRAMYCIN <=1 Sensitive     CIPROFLOXACIN >=4 Resistant     LEVOFLOXACIN >=8 Resistant     NITROFURANTOIN <=16 Sensitive     TRIMETH/SULFA* >=320 Resistant      * NR=NOT REPORTABLE,SEE COMMENTORAL therapy:A cefazolin MIC of <32 predicts susceptibility to the oral agents cefaclor,cefdinir,cefpodoxime,cefprozil,cefuroxime,cephalexin,and loracarbef when used for therapy of uncomplicated UTIs due to E.coli,K.pneumomiae,and P.mirabilis. PARENTERAL therapy: A cefazolinMIC of >8 indicates resistance to parenteralcefazolin. An alternate test method must beperformed to confirm susceptibility to parenteralcefazolin.  POCT urinalysis dipstick  Result Value Ref Range   Color, UA straw    Clarity, UA cloudy    Glucose, UA neg    Bilirubin, UA neg     Ketones, UA neg    Spec Grav, UA 1.010    Blood, UA neg    pH, UA 6.5    Protein, UA trace    Urobilinogen, UA 0.2    Nitrite, UA positive    Leukocytes, UA Negative Negative      Assessment & Plan:   Problem List Items Addressed This Visit    COPD (chronic obstructive pulmonary disease) ([place])    Concern for possible dyspnea secondary to COPD with possible mild acute exacerbation however no bronchitis or wheezing on exam. Could be progression of problem. Also differential includes recent change to longer acting opiate oxycontin with respiratory depression - No hypoxia (94% on 2L continuous home O2), afebrile, no recent hospitalization  Plan: 1. Start Prednisone 40mg  x 7 days then 20mg  x 7 days - last up to 2 weeks until sees Pulmonology 01/07/17, discuss further with them may call next week for update 2. Check CXR today, reviewed results with increased vascular congestion concern for possible CHF with chronic interstitial thickening - called patient's daughter to review these X-ray results today, advised to increase lasix from 20mg  daily to 40mg  daily for 3-4 days, then  resume 20, and take prednisone as prescribed, hold oxycontin for another 1-2 days, can cut in half for less dose if needed, follow-up with pain management for further advice on opiates, concern some respiratory depression, and advised her to schedule close follow-up with [facility] cardiology for further discussion on possible CHF diagnosis and management 3. Follow-up as scheduled with Pulmonology, return criteria reviewed if worsening, hold antibiotics at this time  x 5 day steroid burst 2. Use albuterol q 4 hr regularly x 2-3 days. Continue maintenance inhalers 3. Considered antibiotics, however afebrile and no hypoxia, would not be indicated in mild AECOPD, unless recurrent or not improved on steroids 4. RTC about 1 week if not improving, otherwise strict return criteria to go to ED  Secondary to severe COPD with hypoxia on  supplemental O2 2L continuous, benefiting from therapy Now worsening dyspnea recently, unclear exact etiology - Cover for possible COPD with prednisone course, follow-up with Pulm - Consider possible respiratory depression on changed 12 hr long acting oxycontin from pain management, now off of this temporarily - Also CXR today shows concern for possible vascular congestion, maybe early evidence of some CHF as factor as well, however not clinically volume overloaded, advised to increase Lasix to 40mg  for 3-4 days then resume 20mg  daily and contact [facility] to follow-up as soon as possible for further work-up      Relevant Medications   predniSONE (DELTASONE) 20 MG tablet   Chronic respiratory failure with hypoxia, on home O2 therapy (HCC)    Secondary to severe COPD with hypoxia on supplemental O2 2L continuous, benefiting from therapy Now worsening dyspnea recently, unclear exact etiology - Cover for possible COPD with prednisone course, follow-up with Pulm - Consider possible respiratory depression on changed 12 hr long acting oxycontin from pain management, now off of this temporarily - Also CXR today shows concern for possible vascular congestion, maybe early evidence of some CHF as factor as well, however not clinically volume overloaded, advised to increase Lasix to 40mg  for 3-4 days then resume 20mg  daily and contact [facility] Cardiology to follow-up as soon as possible for further work-up       Other Visit Diagnoses    SOB (shortness of breath)    -  Primary   Relevant Medications   predniSONE (DELTASONE) 20 MG tablet   Other Relevant Orders   DG Chest 2 View (Completed)      Meds ordered this encounter  Medications  . predniSONE (DELTASONE) 20 MG tablet    Sig: Take 2 tablets daily (40mg ) for 7 days, then take 1 tab daily (20mg ) for 7 days.    Dispense:  21 tablet    Refill:  0      Follow up plan: Return in about 2 weeks (around 01/07/2017), or if symptoms  worsen or fail to improve, for dyspnea, COPD.  Nobie Putnam, DO [facility] 12/24/2016, 2:01 PM

## 2016-12-24 NOTE — ED Notes (Signed)
Elevated troponin reported to Dr Kerman Passey

## 2016-12-24 NOTE — Discharge Instructions (Addendum)
Stop doxycyline. Take augmentin twice daily for a week. Repeat urine culture sent and your doctor can follow it up.   Continue steroids and lasix as prescribed by your doctor  See your doctor and cardiologist  Return to ER if you have severe chest pain, shortness of breath, fever, vomiting.

## 2016-12-26 ENCOUNTER — Other Ambulatory Visit: Payer: Self-pay | Admitting: Family Medicine

## 2016-12-27 LAB — URINE CULTURE

## 2016-12-29 DIAGNOSIS — Z87440 Personal history of urinary (tract) infections: Secondary | ICD-10-CM | POA: Diagnosis not present

## 2016-12-29 DIAGNOSIS — F17200 Nicotine dependence, unspecified, uncomplicated: Secondary | ICD-10-CM | POA: Diagnosis not present

## 2016-12-29 DIAGNOSIS — G4733 Obstructive sleep apnea (adult) (pediatric): Secondary | ICD-10-CM | POA: Diagnosis not present

## 2016-12-29 DIAGNOSIS — J42 Unspecified chronic bronchitis: Secondary | ICD-10-CM | POA: Diagnosis not present

## 2016-12-29 DIAGNOSIS — I10 Essential (primary) hypertension: Secondary | ICD-10-CM | POA: Diagnosis not present

## 2016-12-29 DIAGNOSIS — I270 Primary pulmonary hypertension: Secondary | ICD-10-CM | POA: Diagnosis not present

## 2016-12-29 DIAGNOSIS — E785 Hyperlipidemia, unspecified: Secondary | ICD-10-CM | POA: Diagnosis not present

## 2016-12-29 DIAGNOSIS — I509 Heart failure, unspecified: Secondary | ICD-10-CM | POA: Diagnosis not present

## 2016-12-29 DIAGNOSIS — R0902 Hypoxemia: Secondary | ICD-10-CM | POA: Diagnosis not present

## 2017-01-06 ENCOUNTER — Telehealth: Payer: Self-pay | Admitting: *Deleted

## 2017-01-06 ENCOUNTER — Emergency Department
Admission: EM | Admit: 2017-01-06 | Discharge: 2017-01-06 | Disposition: A | Discharge status: Home / Self Care | Payer: Commercial Managed Care - HMO | Attending: Emergency Medicine | Admitting: Emergency Medicine

## 2017-01-06 ENCOUNTER — Encounter: Payer: Self-pay | Admitting: Emergency Medicine

## 2017-01-06 DIAGNOSIS — I10 Essential (primary) hypertension: Secondary | ICD-10-CM | POA: Diagnosis not present

## 2017-01-06 DIAGNOSIS — Z87891 Personal history of nicotine dependence: Secondary | ICD-10-CM | POA: Insufficient documentation

## 2017-01-06 DIAGNOSIS — Z7984 Long term (current) use of oral hypoglycemic drugs: Secondary | ICD-10-CM | POA: Diagnosis not present

## 2017-01-06 DIAGNOSIS — Z5321 Procedure and treatment not carried out due to patient leaving prior to being seen by health care provider: Secondary | ICD-10-CM | POA: Insufficient documentation

## 2017-01-06 DIAGNOSIS — E119 Type 2 diabetes mellitus without complications: Secondary | ICD-10-CM | POA: Diagnosis not present

## 2017-01-06 DIAGNOSIS — J449 Chronic obstructive pulmonary disease, unspecified: Secondary | ICD-10-CM | POA: Diagnosis not present

## 2017-01-06 LAB — COMPREHENSIVE METABOLIC PANEL
ALT: 25 U/L (ref 14–54)
ANION GAP: 7 (ref 5–15)
AST: 19 U/L (ref 15–41)
Albumin: 3.6 g/dL (ref 3.5–5.0)
Alkaline Phosphatase: 50 U/L (ref 38–126)
BUN: 26 mg/dL — ABNORMAL HIGH (ref 6–20)
CHLORIDE: 101 mmol/L (ref 101–111)
CO2: 33 mmol/L — AB (ref 22–32)
CREATININE: 1.13 mg/dL — AB (ref 0.44–1.00)
Calcium: 9.3 mg/dL (ref 8.9–10.3)
GFR calc non Af Amer: 46 mL/min — ABNORMAL LOW (ref >60)
GFR, EST AFRICAN AMERICAN: 54 mL/min — AB (ref >60)
Glucose, Bld: 132 mg/dL — ABNORMAL HIGH (ref 65–99)
POTASSIUM: 4.1 mmol/L (ref 3.5–5.1)
Sodium: 141 mmol/L (ref 135–145)
Total Bilirubin: 0.3 mg/dL (ref 0.3–1.2)
Total Protein: 6.3 g/dL — ABNORMAL LOW (ref 6.5–8.1)

## 2017-01-06 LAB — CBC WITH DIFFERENTIAL/PLATELET
Basophils Absolute: 0.1 10*3/uL (ref 0–0.1)
Basophils Relative: 1 %
EOS ABS: 0.2 10*3/uL (ref 0–0.7)
Eosinophils Relative: 1 %
HCT: 38.4 % (ref 35.0–47.0)
Hemoglobin: 12.4 g/dL (ref 12.0–16.0)
LYMPHS ABS: 3 10*3/uL (ref 1.0–3.6)
Lymphocytes Relative: 22 %
MCH: 28.9 pg (ref 26.0–34.0)
MCHC: 32.3 g/dL (ref 32.0–36.0)
MCV: 89.4 fL (ref 80.0–100.0)
Monocytes Absolute: 1.2 10*3/uL — ABNORMAL HIGH (ref 0.2–0.9)
Monocytes Relative: 9 %
Neutro Abs: 9.2 10*3/uL — ABNORMAL HIGH (ref 1.4–6.5)
Neutrophils Relative %: 67 %
Platelets: 190 10*3/uL (ref 150–440)
RBC: 4.29 MIL/uL (ref 3.80–5.20)
RDW: 14 % (ref 11.5–14.5)
WBC: 13.8 10*3/uL — AB (ref 3.6–11.0)

## 2017-01-06 NOTE — ED Triage Notes (Signed)
Reports headache, states she has been taking her bp at home and it has been 200/?

## 2017-01-06 NOTE — Telephone Encounter (Signed)
Patient called this morning and reports blood pressure 202/100 then rechecked later and it was 183/93. Patient was advised to go to ER.  Patient called back at 2:25 she rechecked bp and it was 213/103. Patient advised again to go to ER. She agrees. We will follow up tomorrow.

## 2017-01-07 ENCOUNTER — Ambulatory Visit: Payer: Commercial Managed Care - HMO | Admitting: Family Medicine

## 2017-01-07 DIAGNOSIS — J449 Chronic obstructive pulmonary disease, unspecified: Secondary | ICD-10-CM | POA: Diagnosis not present

## 2017-01-07 DIAGNOSIS — I771 Stricture of artery: Secondary | ICD-10-CM | POA: Diagnosis not present

## 2017-01-07 DIAGNOSIS — Z7984 Long term (current) use of oral hypoglycemic drugs: Secondary | ICD-10-CM | POA: Diagnosis not present

## 2017-01-07 DIAGNOSIS — Z79891 Long term (current) use of opiate analgesic: Secondary | ICD-10-CM | POA: Diagnosis not present

## 2017-01-07 DIAGNOSIS — R42 Dizziness and giddiness: Secondary | ICD-10-CM | POA: Diagnosis not present

## 2017-01-07 DIAGNOSIS — R9089 Other abnormal findings on diagnostic imaging of central nervous system: Secondary | ICD-10-CM | POA: Diagnosis not present

## 2017-01-07 DIAGNOSIS — Z79899 Other long term (current) drug therapy: Secondary | ICD-10-CM | POA: Diagnosis not present

## 2017-01-07 DIAGNOSIS — R51 Headache: Secondary | ICD-10-CM | POA: Diagnosis not present

## 2017-01-07 DIAGNOSIS — R0602 Shortness of breath: Secondary | ICD-10-CM | POA: Diagnosis not present

## 2017-01-07 DIAGNOSIS — I6529 Occlusion and stenosis of unspecified carotid artery: Secondary | ICD-10-CM | POA: Diagnosis not present

## 2017-01-07 DIAGNOSIS — R001 Bradycardia, unspecified: Secondary | ICD-10-CM | POA: Diagnosis not present

## 2017-01-07 DIAGNOSIS — R918 Other nonspecific abnormal finding of lung field: Secondary | ICD-10-CM | POA: Diagnosis not present

## 2017-01-07 DIAGNOSIS — H538 Other visual disturbances: Secondary | ICD-10-CM | POA: Diagnosis not present

## 2017-01-07 DIAGNOSIS — R5383 Other fatigue: Secondary | ICD-10-CM | POA: Diagnosis not present

## 2017-01-07 DIAGNOSIS — I10 Essential (primary) hypertension: Secondary | ICD-10-CM | POA: Diagnosis not present

## 2017-01-07 NOTE — Telephone Encounter (Signed)
Noted-jh 

## 2017-01-08 ENCOUNTER — Encounter: Payer: Self-pay | Admitting: Family Medicine

## 2017-01-08 ENCOUNTER — Ambulatory Visit (INDEPENDENT_AMBULATORY_CARE_PROVIDER_SITE_OTHER): Payer: Commercial Managed Care - HMO | Admitting: Family Medicine

## 2017-01-08 VITALS — BP 140/66 | HR 64 | Temp 98.4°F | Resp 16 | Ht 68.0 in | Wt 231.0 lb

## 2017-01-08 DIAGNOSIS — I10 Essential (primary) hypertension: Secondary | ICD-10-CM | POA: Diagnosis not present

## 2017-01-08 MED ORDER — AMLODIPINE BESYLATE 5 MG PO TABS: 2.5000 mg | ORAL_TABLET | Freq: Every day | ORAL | 2 refills | Status: DC

## 2017-01-08 NOTE — Progress Notes (Signed)
Subjective:    Patient ID: Megan Bolton, female    DOB: 1941-04-14, 76 y.o.   MRN: [id number]  Megan Bolton is a 76 y.o. female presenting on 01/08/2017 for Hypertension  History provided by patient, also friend is present who provided transportation.  HPI   ED FOLLOW-UP ACCELERATED HTN: - Recent course with acutely elevated HTN up to 180-200/100 x 2 days ago, patient contacted our office and was advised to go to ED, she tried to go to ED but did not want to wait and ended up leaving, then following day on 01/07/17 she had recurrent elevated BP and presented to [facility] ED. Reviewed entire ED course there with some associated symptoms of elevated BP with frontal headache, some dizziness, and occasional blurred vision. Readings in ED were 150-190/90-100. She was given repeat dose Lisinopril 20mg , and had extensive testing with labs, including troponin negative, no sign of end organ injury, had head CT, brain / neck MRI, EKG, overall no acute findings of abnormality, no evidence of CVA. Her BP gradually improved, discharged to follow-up with PCP, no med changes made - Also note patient had recently seen her established Cardiologist Dr Clayborn Bigness [facility]) on 12/29/16, for pulmonary HTN and CHF, her BP was stable 120s/60s at that visit, plan was for future ECHO. - Now presents today for ED follow-up with home recorded BPs 201/104, at 0800, had symptoms again of dizziness, lightheadedness, and frontal bilateral headache, with, improves when BP resolves, had blurry vision, then took Lisinopril this morning 20mg , improved to 135/69, then by 0900 up to 167/73 - Currently denies any acute symptoms, but does not feel back to baseline - Admits a history of hypotension and near syncope months to years ago, not recently - Current meds Carvedilol 25mg  BID, Lisinopril 20mg  daily, Furosemide 20mg  daily (recently back on 12/28 I had increased lasix to 40mg  daily for 3-4 days with dyspnea, then  resume 20) - Also pertinent med changes, she was placed on Prednisone burst /taper for dyspnea / COPD for up to 2 weeks started on 12/28, and last dose would have been 1/10 (same day onset symptoms), she has seen her Pulm as well - No changes otherwise to her oxygen, doing well on current home portable O2 2L continuous, seems to benefit from therapy - Denies any chest pain or pressure, fevers/chills, productive cough, wheezing   Social History  Substance Use Topics  . Smoking status: Former Smoker    Packs/day: 0.50    Quit date: 04/28/2015  . Smokeless tobacco: Never Used  . Alcohol use No    Review of Systems Per HPI unless specifically indicated above     Objective:    BP 140/66 (BP Location: Left Arm, Cuff Size: Normal)   Pulse 64   Temp 98.4 F (36.9 C) (Oral)   Resp 16   Ht 5\' 8"  (1.727 m)   Wt 231 lb (104.8 kg)   SpO2 95%   BMI 35.12 kg/m   Wt Readings from Last 3 Encounters:  01/08/17 231 lb (104.8 kg)  01/06/17 250 lb (113.4 kg)  12/24/16 222 lb (100.7 kg)    Physical Exam  Constitutional: She is oriented to person, place, and time. She appears well-developed and well-nourished. No distress.  Chronically ill-appearing but currently seems well, comfortable, cooperative  HENT:  Head: Normocephalic and atraumatic.  Mouth/Throat: Oropharynx is clear and moist.  On portable oxygen 2L  Eyes: Conjunctivae and EOM are normal. Pupils are equal, round, and reactive  to light.  Neck: Normal range of motion. Neck supple.  Cardiovascular: Normal rate and intact distal pulses.   Pulmonary/Chest: Effort normal and breath sounds normal. No respiratory distress. She has no wheezes. She has no rales.  Improved to stable mild diffuse reduced air movement but overall lungs are clear without wheezing, coarse sounds or any fine crackles. Speaks full sentences. No increased work of breathing.  Musculoskeletal: She exhibits no edema.  Lymphadenopathy:    She has no cervical  adenopathy.  Neurological: She is alert and oriented to person, place, and time.  Skin: Skin is warm and dry. She is not diaphoretic.  Psychiatric: Her behavior is normal.  Nursing note and vitals reviewed.   I have personally reviewed the radiology reports below from 01/07/17, from [place] at [place], Rad Results In - 01/07/2017 2:18 PM EST  EXAM: Magnetic resonance angiography, neck, without and with contrast material. DATE: 01/07/2017 2:12 PM ACCESSION: [id number] UN DICTATED: 01/07/2017 2:17 PM INTERPRETATION LOCATION: [place]  CLINICAL INDICATION: 76 years old Female with dizziness--   COMPARISON: MRI of the brain from same day.  TECHNIQUE: MRA of the extracranial arterial circulation in the neck was performed using pre-2D Time of Flight and post-contrast MRA.  FINDINGS: Tortuous course of the common carotid arteries in the neck with the bilateral retropharyngeal course. No hemodynamically significant stenosis or aneurysmal dilatation. Antegrade flow in the vertebral arteries which demonstrate a normal course and caliber.    IMPRESSION: No hemodynamically significant stenosis or aneurysmal dilatation of the great vessels of the neck.   ----------------------------------------------------------- Interface, Rad Results In - 01/07/2017 2:33 PM EST  EXAM: Magnetic resonance imaging, brain, without and with contrast material. DATE: 01/07/2017 2:13 PM ACCESSION: [id number] UN DICTATED: 01/07/2017 2:19 PM INTERPRETATION LOCATION: [place]  CLINICAL INDICATION: 76 years old Female with DIZZINESS AND GIDDINESS--   COMPARISON: Head CT 01/07/2017  TECHNIQUE: Multiplanar, multisequence MR imaging of the brain was performed without and with I.V. contrast.  FINDINGS: There is no focal parenchymal signal abnormality. There are scattered and confluent foci of signal abnormality within the periventricular and deep white matter as well as within the pons. These are  nonspecific but commonly seen with small vessel ischemic changes.   There is no midline shift or mass lesion. There is no evidence of intracranial hemorrhage or acute infarct. There are no extra-axial fluid collections present. No diffusion weighted signal abnormality is identified. Focus of susceptibility artifact in the left cerebellum likely represents remote microhemorrhage. There is no abnormal enhancement.   IMPRESSION: No acute intracranial abnormality. ----------------------------------------------------  Interface, Rad Results In - 01/07/2017 11:47 AM EST  EXAM: Computed tomography, head or brain without contrast material. DATE: 01/07/2017 11:19 AM ACCESSION: [id number] UN DICTATED: 01/07/2017 11:36 AM INTERPRETATION LOCATION: [place]  CLINICAL INDICATION: 76 years old Female with dizziness--   COMPARISON: None  TECHNIQUE: Axial CT images of the head from skull base to vertex without contrast.   FINDINGS: There is no midline shift or mass lesion. There is no evidence of intracranial hemorrhage or acute infarct. Mild diffuse brain volume loss. Atherosclerotic calcifications of the carotid siphons and intradural vertebral arteries. No fractures are evident. The sinuses are pneumatized.    IMPRESSION: No acute intracranial abnormality.   ---------------------------------------------------  Interface, Rad Results In - 01/07/2017 12:19 PM EST  EXAM: CHEST TWO VIEW DATE: 01/07/2017 11:09 AM ACCESSION: [id number] UN DICTATED: 01/07/2017 11:10 AM INTERPRETATION LOCATION: [place]  CLINICAL INDICATION: 76 years old Female with SHORTNESS OF BREATH--   COMPARISON:  Chest radiograph dated 07/15/2015 and prior studies.  TECHNIQUE: PA and lateral views of the chest.  FINDINGS: Patchy bibasilar opacities.  No pleural effusion. No pneumothorax.  Cardiomediastinal silhouette is unremarkable.  Loss of height of a lower thoracic or upper lumbar vertebral body,  unchanged.   IMPRESSION: Patchy bibasilar opacities, likely atelectasis.  --------------------------------------------------------- I have personally reviewed the following lab results from 01/06/17.  Results for orders placed or performed during the hospital encounter of 01/06/17  CBC with Differential  Result Value Ref Range   WBC 13.8 (H) 3.6 - 11.0 K/uL   RBC 4.29 3.80 - 5.20 MIL/uL   Hemoglobin 12.4 12.0 - 16.0 g/dL   HCT 38.4 35.0 - 47.0 %   MCV 89.4 80.0 - 100.0 fL   MCH 28.9 26.0 - 34.0 pg   MCHC 32.3 32.0 - 36.0 g/dL   RDW 14.0 11.5 - 14.5 %   Platelets 190 150 - 440 K/uL   Neutrophils Relative % 67 %   Neutro Abs 9.2 (H) 1.4 - 6.5 K/uL   Lymphocytes Relative 22 %   Lymphs Abs 3.0 1.0 - 3.6 K/uL   Monocytes Relative 9 %   Monocytes Absolute 1.2 (H) 0.2 - 0.9 K/uL   Eosinophils Relative 1 %   Eosinophils Absolute 0.2 0 - 0.7 K/uL   Basophils Relative 1 %   Basophils Absolute 0.1 0 - 0.1 K/uL  Comprehensive metabolic panel  Result Value Ref Range   Sodium 141 135 - 145 mmol/L   Potassium 4.1 3.5 - 5.1 mmol/L   Chloride 101 101 - 111 mmol/L   CO2 33 (H) 22 - 32 mmol/L   Glucose, Bld 132 (H) 65 - 99 mg/dL   BUN 26 (H) 6 - 20 mg/dL   Creatinine, Ser 1.13 (H) 0.44 - 1.00 mg/dL   Calcium 9.3 8.9 - 10.3 mg/dL   Total Protein 6.3 (L) 6.5 - 8.1 g/dL   Albumin 3.6 3.5 - 5.0 g/dL   AST 19 15 - 41 U/L   ALT 25 14 - 54 U/L   Alkaline Phosphatase 50 38 - 126 U/L   Total Bilirubin 0.3 0.3 - 1.2 mg/dL   GFR calc non Af Amer 46 (L) >60 mL/min   GFR calc Af Amer 54 (L) >60 mL/min   Anion gap 7 5 - 15      Assessment & Plan:   Problem List Items Addressed This Visit    Essential hypertension - Primary    Currently asymptomatic and normalized BP or near goal, with concern recent erratic BP with accelerated HTN 2 days ago up to 200/100, with fluctuations and recently even down to 120/60 in normal range, has associated symptoms with HTN episodes, recent [name] with  extensive work-up without evidence of any end organ injury (no MI, no CVA, labs nml range kidney/liver) - Overall unclear exact etiology, however suspect may be related to recent Prednisone taper 2 weeks, 12/28 to 1/10 when onset symptoms  Plan: 1. Continue Carvedilol 25mg  BID, Lisinopril 20mg  daily 2. Add new rx Amlodipine 5mg , can start with cutting in half for 2.5mg , advised to try this only PRN for now, monitor BP closely going into weekend and next week, if persistent >180/100 can start taking Amlodipine 2.5 then increase to 5 if needed, however if needing to take amlodipine should notify our office with BP readings, perhaps she may not need it at all if normalizes now after prednisone 3. Continue Lasix 20mg  daily, no change 4. No new  work-up today, agree with follow-up Cardiology ECHO as planned 5. Follow-up 4 weeks for office visit BP, may return sooner for nurse BP check if needed 6. Reviewed strict return criteria when to go to ED if acute HTN episode again      Relevant Medications   amLODipine (NORVASC) 5 MG tablet      Meds ordered this encounter  Medications  . amLODipine (NORVASC) 5 MG tablet    Sig: Take 0.5-1 tablets (2.5-5 mg total) by mouth daily.    Dispense:  30 tablet    Refill:  2      Follow up plan: Return in about 4 weeks (around 02/05/2017) for blood pressure.  Nobie Putnam, [facility] 01/09/2017, 10:44 AM

## 2017-01-08 NOTE — Patient Instructions (Signed)
Thank you for coming in to clinic today.  1. For BP, it seems to have been fluctuating recently, with some high values - I think this may be due to the Prednisone recently, it cause it to be raised and may take few more days to completely get out of your system - MEDICATION CHANGES - Continue Carvedilol 25mg  twice a day - Start taking Lisinopril 20mg  MORNING everyday, no longer taking this one at night - You have new rx for Amlodipine 5mg  tabs, you can start by cutting in half for 2.5mg  dose ONLY AS NEEDED, if BP >180/100, if this elevated, then go ahead and re-check it within 30 min, and if still elevated you can take the 2.5mg  dose, especially if you feel symptoms of headache, dizziness, lightheadedness or other similar symptoms, I recommend that you notify our office or seek more immediate treatment if BP still not improving on re-check.  Check BP about 3 times a day for now  If emergency with persistent elevated BP and other concerning symptoms may need to go back to Emergency Department  Please discuss BP with Dr Clayborn Bigness at next visit and follow-up with the ECHO or heart ultrasound  Please schedule a follow-up appointment with Dr. Parks Ranger in 4-6 for HTN BP re-check  If you have any other questions or concerns, please feel free to call the clinic or send a message through [place]. You may also schedule an earlier appointment if necessary.  Nobie Putnam, DO [place]

## 2017-01-09 DIAGNOSIS — J449 Chronic obstructive pulmonary disease, unspecified: Secondary | ICD-10-CM | POA: Diagnosis not present

## 2017-01-09 DIAGNOSIS — J438 Other emphysema: Secondary | ICD-10-CM | POA: Diagnosis not present

## 2017-01-09 NOTE — Assessment & Plan Note (Addendum)
Currently asymptomatic and normalized BP or near goal, with concern recent erratic BP with accelerated HTN 2 days ago up to 200/100, with fluctuations and recently even down to 120/60 in normal range, has associated symptoms with HTN episodes, recent [name] with extensive work-up without evidence of any end organ injury (no MI, no CVA, labs nml range kidney/liver) - Overall unclear exact etiology, however suspect may be related to recent Prednisone taper 2 weeks, 12/28 to 1/10 when onset symptoms  Plan: 1. Continue Carvedilol 25mg  BID, Lisinopril 20mg  daily 2. Add new rx Amlodipine 5mg , can start with cutting in half for 2.5mg , advised to try this only PRN for now, monitor BP closely going into weekend and next week, if persistent >180/100 can start taking Amlodipine 2.5 then increase to 5 if needed, however if needing to take amlodipine should notify our office with BP readings, perhaps she may not need it at all if normalizes now after prednisone 3. Continue Lasix 20mg  daily, no change 4. No new work-up today, agree with follow-up Cardiology ECHO as planned 5. Follow-up 4 weeks for office visit BP, may return sooner for nurse BP check if needed 6. Reviewed strict return criteria when to go to ED if acute HTN episode again

## 2017-01-11 ENCOUNTER — Ambulatory Visit (INDEPENDENT_AMBULATORY_CARE_PROVIDER_SITE_OTHER): Payer: Commercial Managed Care - HMO | Admitting: Family Medicine

## 2017-01-11 ENCOUNTER — Encounter: Payer: Self-pay | Admitting: Family Medicine

## 2017-01-11 VITALS — BP 140/65 | HR 59 | Temp 97.8°F | Resp 16 | Ht 69.0 in | Wt 229.0 lb

## 2017-01-11 DIAGNOSIS — I10 Essential (primary) hypertension: Secondary | ICD-10-CM

## 2017-01-11 NOTE — Progress Notes (Signed)
Name: Megan Bolton   MRN: [id number]    DOB: 10-Jan-1941   Date:01/11/2017       Progress Note  Subjective  Chief Complaint  Chief Complaint  Patient presents with  . Hypertension    HPI  Here for f/u of HBP.  She has had spikes of BP to 230/110 over the past several days.  Was seen here 3 days ago and BP meds added.  She also went to ER at [facility] No problem-specific [place] notes found for this encounter.   Past Medical History:  Diagnosis Date  . COPD (chronic obstructive pulmonary disease) ([place])   . Diabetes mellitus without complication ([place])   . Hyperlipidemia   . Hypertension     Past Surgical History:  Procedure Laterality Date  . ABDOMINAL HYSTERECTOMY    . SPINE SURGERY      Family History  Problem Relation Age of Onset  . Hypertension Paternal Grandfather     Social History   Social History  . Marital status: Widowed    Spouse name: N/A  . Number of children: N/A  . Years of education: N/A   Occupational History  . retired    Social History Main Topics  . Smoking status: Former Smoker    Packs/day: 0.50    Quit date: 04/28/2015  . Smokeless tobacco: Never Used  . Alcohol use No  . Drug use: No  . Sexual activity: Not on file   Other Topics Concern  . Not on file   Social History Narrative  . No narrative on file     Current Outpatient Prescriptions:  .  ACCU-CHEK AVIVA PLUS test strip, 1 strip by Other route 2 (two) times daily., Disp: , Rfl:  .  ACCU-CHEK SOFTCLIX LANCETS lancets, 1 each by Other route 2 (two) times daily., Disp: , Rfl:  .  albuterol (PROVENTIL) (2.5 MG/3ML) 0.083% nebulizer solution, Take 3 mLs (2.5 mg total) by nebulization every 6 (six) hours as needed for wheezing., Disp: 180 vial, Rfl: 12 .  albuterol (VENTOLIN HFA) 108 (90 Base) MCG/ACT inhaler, Inhale 2 puffs into the lungs every 6 (six) hours as needed for wheezing or shortness of breath., Disp: 2 Inhaler, Rfl: 12 .  amLODipine (NORVASC) 5 MG tablet, Take  0.5-1 tablets (2.5-5 mg total) by mouth daily., Disp: 30 tablet, Rfl: 2 .  Blood Glucose Monitoring Suppl (ACCU-CHEK AVIVA CONNECT) w/Device KIT, 1 kit by Other route 2 (two) times daily., Disp: 1 kit, Rfl: 11 .  carvedilol (COREG) 25 MG tablet, Take 1 tablet (25 mg total) by mouth 2 (two) times daily., Disp: 180 tablet, Rfl: 3 .  clonazePAM (KLONOPIN) 0.5 MG tablet, 0.5 mg at bedtime. , Disp: , Rfl:  .  Fluticasone-Salmeterol (ADVAIR DISKUS) 250-50 MCG/DOSE AEPB, Inhale 1 puff into the lungs 2 (two) times daily., Disp: 1 each, Rfl: 12 .  furosemide (LASIX) 20 MG tablet, TAKE ONE TABLET BY MOUTH EVERY DAY, Disp: 30 tablet, Rfl: 6 .  gabapentin (NEURONTIN) 800 MG tablet, Take 1 tablet (800 mg total) by mouth 3 (three) times daily., Disp: 270 tablet, Rfl: 3 .  lisinopril (PRINIVIL,ZESTRIL) 20 MG tablet, Take 1 tablet (20 mg total) by mouth daily., Disp: 90 tablet, Rfl: 3 .  metFORMIN (GLUCOPHAGE) 500 MG tablet, Take 1 tablet (500 mg total) by mouth daily with breakfast., Disp: 30 tablet, Rfl: 12 .  montelukast (SINGULAIR) 10 MG tablet, Take 1 tablet (10 mg total) by mouth at bedtime., Disp: 30 tablet, Rfl: 12 .  mupirocin ointment (BACTROBAN) 2 %, One application as directed three times a day., Disp: 22 g, Rfl: 3 .  OXYCONTIN 10 MG 12 hr tablet, Take 10 mg by mouth 2 (two) times daily., Disp: , Rfl:  .  OXYGEN, Inhale 2 L into the lungs continuous., Disp: , Rfl:  .  potassium chloride (K-DUR) 10 MEQ tablet, Take 1 tablet (10 mEq total) by mouth 2 (two) times daily., Disp: 60 tablet, Rfl: 5 .  SPIRIVA HANDIHALER 18 MCG inhalation capsule, Place 1 capsule (18 mcg total) into inhaler and inhale daily., Disp: 30 capsule, Rfl: 6 .  tiZANidine (ZANAFLEX) 4 MG tablet, Take 4 mg by mouth daily., Disp: , Rfl:  .  valACYclovir (VALTREX) 1000 MG tablet, Take 1 tablet (1,000 mg total) by mouth daily as needed (for fever blisters)., Disp: 30 tablet, Rfl: 12  Allergies  Allergen Reactions  . Nitrofurantoin  Anaphylaxis     Review of Systems  Constitutional: Negative for chills, fever, malaise/fatigue and weight loss.  HENT: Negative for hearing loss and tinnitus.   Eyes: Negative for blurred vision and double vision.  Respiratory: Negative for cough, shortness of breath and wheezing.   Cardiovascular: Negative for chest pain, palpitations and leg swelling.  Gastrointestinal: Negative for abdominal pain, blood in stool, heartburn, nausea and vomiting.  Genitourinary: Negative for dysuria and frequency.  Skin: Negative for rash.  Neurological: Positive for dizziness and headaches. Negative for tingling, tremors and weakness.      Objective  Vitals:   01/11/17 1046 01/11/17 1147  BP: (!) 162/81 140/65  Pulse: (!) 59   Resp: 16   Temp: 97.8 F (36.6 C)   TempSrc: Oral   Weight: 229 lb (103.9 kg)   Height: 5' 9"  (1.753 m)     Physical Exam  Constitutional: She is oriented to person, place, and time and well-developed, well-nourished, and in no distress. No distress.  HENT:  Head: Normocephalic and atraumatic.  Eyes: Conjunctivae and EOM are normal. Pupils are equal, round, and reactive to light. No scleral icterus.  Neck: Normal range of motion. Neck supple. Carotid bruit is not present. No thyromegaly present.  Cardiovascular: Normal rate, regular rhythm and normal heart sounds.  Exam reveals no gallop and no friction rub.   No murmur heard. Pulmonary/Chest: Effort normal and breath sounds normal. No respiratory distress. She has no wheezes. She has no rales.  Abdominal: Soft. Bowel sounds are normal. She exhibits no distension and no mass. There is no tenderness.  Musculoskeletal: She exhibits no edema.  Lymphadenopathy:    She has no cervical adenopathy.  Neurological: She is alert and oriented to person, place, and time.  Vitals reviewed.      Recent Results (from the past 2160 hour(s))  COMPLETE METABOLIC PANEL WITH GFR     Status: Abnormal   Collection Time:  12/01/16 12:01 AM  Result Value Ref Range   Sodium 142 135 - 146 mmol/L   Potassium 4.4 3.5 - 5.3 mmol/L   Chloride 105 98 - 110 mmol/L   CO2 30 20 - 31 mmol/L   Glucose, Bld 139 (H) 65 - 99 mg/dL   BUN 23 7 - 25 mg/dL   Creat 1.31 (H) 0.60 - 0.93 mg/dL    Comment:   For patients > or = 76 years of age: The upper reference limit for Creatinine is approximately 13% higher for people identified as African-American.      Total Bilirubin 0.5 0.2 - 1.2 mg/dL   Alkaline Phosphatase  47 33 - 130 U/L   AST 12 10 - 35 U/L   ALT 15 6 - 29 U/L   Total Protein 5.9 (L) 6.1 - 8.1 g/dL   Albumin 3.4 (L) 3.6 - 5.1 g/dL   Calcium 8.9 8.6 - 10.4 mg/dL   GFR, Est African American 46 (L) >=60 mL/min   GFR, Est Non African American 40 (L) >=60 mL/min  CBC with Differential     Status: Abnormal   Collection Time: 12/01/16 12:01 AM  Result Value Ref Range   WBC 10.8 3.8 - 10.8 K/uL   RBC 4.29 3.80 - 5.10 MIL/uL   Hemoglobin 12.1 11.7 - 15.5 g/dL   HCT 38.6 35.0 - 45.0 %   MCV 90.0 80.0 - 100.0 fL   MCH 28.2 27.0 - 33.0 pg   MCHC 31.3 (L) 32.0 - 36.0 g/dL   RDW 13.6 11.0 - 15.0 %   Platelets 190 140 - 400 K/uL   MPV 11.1 7.5 - 12.5 fL   Neutro Abs 6,696 1,500 - 7,800 cells/uL   Lymphs Abs 2,808 850 - 3,900 cells/uL   Monocytes Absolute 864 200 - 950 cells/uL   Eosinophils Absolute 324 15 - 500 cells/uL   Basophils Absolute 108 0 - 200 cells/uL   Neutrophils Relative % 62 %   Lymphocytes Relative 26 %   Monocytes Relative 8 %   Eosinophils Relative 3 %   Basophils Relative 1 %   Smear Review Criteria for review not met   Lipid Profile     Status: None   Collection Time: 12/01/16 12:01 AM  Result Value Ref Range   Cholesterol 154 <200 mg/dL   Triglycerides 90 <150 mg/dL   HDL 74 >50 mg/dL   Total CHOL/HDL Ratio 2.1 <5.0 Ratio   VLDL 18 <30 mg/dL   LDL Cholesterol 62 <100 mg/dL  HgB A1c     Status: Abnormal   Collection Time: 12/01/16 12:01 AM  Result Value Ref Range   Hgb A1c MFr Bld  6.4 (H) <5.7 %    Comment:   For someone without known diabetes, a hemoglobin A1c value between 5.7% and 6.4% is consistent with prediabetes and should be confirmed with a follow-up test.   For someone with known diabetes, a value <7% indicates that their diabetes is well controlled. A1c targets should be individualized based on duration of diabetes, age, co-morbid conditions and other considerations.   This assay result is consistent with an increased risk of diabetes.   Currently, no consensus exists regarding use of hemoglobin A1c for diagnosis of diabetes in children.      Mean Plasma Glucose 137 mg/dL  Urine Culture     Status: None   Collection Time: 12/16/16 12:01 AM  Result Value Ref Range   Culture ESCHERICHIA COLI     Comment: SOURCE: URINE&URINE Confirmed Extended Spectrum Beta-Lactamase Producer (ESBL)    Colony Count >=100,000 COLONIES/ML    Organism ID, Bacteria ESCHERICHIA COLI       Susceptibility   Escherichia coli -  (no method available)    AMPICILLIN >=32 Resistant     AMOX/CLAVULANIC 4 Sensitive     AMPICILLIN/SULBACTAM 16 Intermediate     PIP/TAZO <=4 Sensitive     IMIPENEM <=0.25 Sensitive     CEFAZOLIN >=64 Resistant     CEFTRIAXONE >=64 Resistant     CEFTAZIDIME 16 Resistant     CEFEPIME  Resistant     GENTAMICIN <=1 Sensitive     TOBRAMYCIN <=1 Sensitive  CIPROFLOXACIN >=4 Resistant     LEVOFLOXACIN >=8 Resistant     NITROFURANTOIN <=16 Sensitive     TRIMETH/SULFA* >=320 Resistant      * NR=NOT REPORTABLE,SEE COMMENTORAL therapy:A cefazolin MIC of <32 predicts susceptibility to the oral agents cefaclor,cefdinir,cefpodoxime,cefprozil,cefuroxime,cephalexin,and loracarbef when used for therapy of uncomplicated UTIs due to E.coli,K.pneumomiae,and P.mirabilis. PARENTERAL therapy: A cefazolinMIC of >8 indicates resistance to parenteralcefazolin. An alternate test method must beperformed to confirm susceptibility to parenteralcefazolin.  POCT  urinalysis dipstick     Status: Abnormal   Collection Time: 12/16/16 11:18 AM  Result Value Ref Range   Color, UA straw    Clarity, UA cloudy    Glucose, UA neg    Bilirubin, UA neg    Ketones, UA neg    Spec Grav, UA 1.010    Blood, UA neg    pH, UA 6.5    Protein, UA trace    Urobilinogen, UA 0.2    Nitrite, UA positive    Leukocytes, UA Negative Negative  Brain natriuretic peptide     Status: Abnormal   Collection Time: 12/24/16  8:13 PM  Result Value Ref Range   B Natriuretic Peptide 410.0 (H) 0.0 - 100.0 pg/mL  CBC with Differential     Status: Abnormal   Collection Time: 12/24/16  8:13 PM  Result Value Ref Range   WBC 8.8 3.6 - 11.0 K/uL   RBC 4.20 3.80 - 5.20 MIL/uL   Hemoglobin 12.3 12.0 - 16.0 g/dL   HCT 37.4 35.0 - 47.0 %   MCV 89.1 80.0 - 100.0 fL   MCH 29.4 26.0 - 34.0 pg   MCHC 33.0 32.0 - 36.0 g/dL   RDW 14.2 11.5 - 14.5 %   Platelets 196 150 - 440 K/uL   Neutrophils Relative % 90 %   Neutro Abs 7.9 (H) 1.4 - 6.5 K/uL   Lymphocytes Relative 8 %   Lymphs Abs 0.7 (L) 1.0 - 3.6 K/uL   Monocytes Relative 1 %   Monocytes Absolute 0.1 (L) 0.2 - 0.9 K/uL   Eosinophils Relative 0 %   Eosinophils Absolute 0.0 0 - 0.7 K/uL   Basophils Relative 1 %   Basophils Absolute 0.1 0 - 0.1 K/uL  Comprehensive metabolic panel     Status: Abnormal   Collection Time: 12/24/16  8:13 PM  Result Value Ref Range   Sodium 138 135 - 145 mmol/L   Potassium 3.9 3.5 - 5.1 mmol/L   Chloride 101 101 - 111 mmol/L   CO2 29 22 - 32 mmol/L   Glucose, Bld 267 (H) 65 - 99 mg/dL   BUN 20 6 - 20 mg/dL   Creatinine, Ser 1.07 (H) 0.44 - 1.00 mg/dL   Calcium 8.9 8.9 - 10.3 mg/dL   Total Protein 6.8 6.5 - 8.1 g/dL   Albumin 3.6 3.5 - 5.0 g/dL   AST 22 15 - 41 U/L   ALT 31 14 - 54 U/L   Alkaline Phosphatase 52 38 - 126 U/L   Total Bilirubin 1.1 0.3 - 1.2 mg/dL   GFR calc non Af Amer 49 (L) >60 mL/min   GFR calc Af Amer 57 (L) >60 mL/min    Comment: (NOTE) The eGFR has been calculated  using the [name] EPI equation. This calculation has not been validated in all clinical situations. eGFR's persistently <60 mL/min signify possible Chronic Kidney Disease.    Anion gap 8 5 - 15  Troponin I     Status: Abnormal  Collection Time: 12/24/16  8:13 PM  Result Value Ref Range   Troponin I 0.03 (HH) <0.03 ng/mL    Comment: CRITICAL RESULT CALLED TO, READ BACK BY AND VERIFIED WITH TERESA HUDSON 12/24/16 @ 2049  MLK   Urinalysis, Complete w Microscopic     Status: Abnormal   Collection Time: 12/24/16  9:30 PM  Result Value Ref Range   Color, Urine YELLOW (A) YELLOW   APPearance CLEAR (A) CLEAR   Specific Gravity, Urine 1.007 1.005 - 1.030   pH 7.0 5.0 - 8.0   Glucose, UA >=500 (A) NEGATIVE mg/dL   Hgb urine dipstick NEGATIVE NEGATIVE   Bilirubin Urine NEGATIVE NEGATIVE   Ketones, ur NEGATIVE NEGATIVE mg/dL   Protein, ur NEGATIVE NEGATIVE mg/dL   Nitrite POSITIVE (A) NEGATIVE   Leukocytes, UA NEGATIVE NEGATIVE   RBC / HPF NONE SEEN 0 - 5 RBC/hpf   WBC, UA 0-5 0 - 5 WBC/hpf   Bacteria, UA MANY (A) NONE SEEN   Squamous Epithelial / LPF 0-5 (A) NONE SEEN   Mucous PRESENT   Urine culture     Status: Abnormal   Collection Time: 12/24/16  9:30 PM  Result Value Ref Range   Specimen Description URINE, RANDOM    Special Requests NONE    Culture (A)     >=100,000 COLONIES/mL ESCHERICHIA COLI Confirmed Extended Spectrum Beta-Lactamase Producer (ESBL) Performed at [facility]    Report Status 12/27/2016 FINAL    Organism ID, Bacteria ESCHERICHIA COLI (A)       Susceptibility   Escherichia coli - MIC*    AMPICILLIN >=32 RESISTANT Resistant     CEFAZOLIN >=64 RESISTANT Resistant     CEFTRIAXONE >=64 RESISTANT Resistant     CIPROFLOXACIN >=4 RESISTANT Resistant     GENTAMICIN <=1 SENSITIVE Sensitive     IMIPENEM <=0.25 SENSITIVE Sensitive     NITROFURANTOIN <=16 SENSITIVE Sensitive     TRIMETH/SULFA >=320 RESISTANT Resistant     AMPICILLIN/SULBACTAM 16 INTERMEDIATE  Intermediate     PIP/TAZO <=4 SENSITIVE Sensitive     Extended ESBL POSITIVE Resistant     * >=100,000 COLONIES/mL ESCHERICHIA COLI  Troponin I     Status: Abnormal   Collection Time: 12/24/16 11:15 PM  Result Value Ref Range   Troponin I 0.03 (HH) <0.03 ng/mL    Comment: CRITICAL VALUE NOTED. VALUE IS CONSISTENT WITH PREVIOUSLY REPORTED/CALLED VALUE.MSS  CBC with Differential     Status: Abnormal   Collection Time: 01/06/17  4:02 PM  Result Value Ref Range   WBC 13.8 (H) 3.6 - 11.0 K/uL   RBC 4.29 3.80 - 5.20 MIL/uL   Hemoglobin 12.4 12.0 - 16.0 g/dL   HCT 38.4 35.0 - 47.0 %   MCV 89.4 80.0 - 100.0 fL   MCH 28.9 26.0 - 34.0 pg   MCHC 32.3 32.0 - 36.0 g/dL   RDW 14.0 11.5 - 14.5 %   Platelets 190 150 - 440 K/uL   Neutrophils Relative % 67 %   Neutro Abs 9.2 (H) 1.4 - 6.5 K/uL   Lymphocytes Relative 22 %   Lymphs Abs 3.0 1.0 - 3.6 K/uL   Monocytes Relative 9 %   Monocytes Absolute 1.2 (H) 0.2 - 0.9 K/uL   Eosinophils Relative 1 %   Eosinophils Absolute 0.2 0 - 0.7 K/uL   Basophils Relative 1 %   Basophils Absolute 0.1 0 - 0.1 K/uL  Comprehensive metabolic panel     Status: Abnormal   Collection Time: 01/06/17  4:02 PM  Result Value Ref Range   Sodium 141 135 - 145 mmol/L   Potassium 4.1 3.5 - 5.1 mmol/L   Chloride 101 101 - 111 mmol/L   CO2 33 (H) 22 - 32 mmol/L   Glucose, Bld 132 (H) 65 - 99 mg/dL   BUN 26 (H) 6 - 20 mg/dL   Creatinine, Ser 1.13 (H) 0.44 - 1.00 mg/dL   Calcium 9.3 8.9 - 10.3 mg/dL   Total Protein 6.3 (L) 6.5 - 8.1 g/dL   Albumin 3.6 3.5 - 5.0 g/dL   AST 19 15 - 41 U/L   ALT 25 14 - 54 U/L   Alkaline Phosphatase 50 38 - 126 U/L   Total Bilirubin 0.3 0.3 - 1.2 mg/dL   GFR calc non Af Amer 46 (L) >60 mL/min   GFR calc Af Amer 54 (L) >60 mL/min    Comment: (NOTE) The eGFR has been calculated using the [name] EPI equation. This calculation has not been validated in all clinical situations. eGFR's persistently <60 mL/min signify possible Chronic  Kidney Disease.    Anion gap 7 5 - 15     Assessment & Plan  Problem List Items Addressed This Visit      Cardiovascular and Mediastinum   Essential hypertension - Primary      No orders of the defined types were placed in this encounter.  1. Essential hypertension Take Carvedilol 25 bid, Lasix 20 mg /d, increase Lisinopril to 40 mg and take in evening, and Take Amlodipine 2.5 mg each AM.  May take an extra 2.5 mg Amlodipine mid day or later if sys BP > 170.  Cont all of her other meds at current doses.

## 2017-01-12 ENCOUNTER — Other Ambulatory Visit: Payer: Self-pay | Admitting: Family Medicine

## 2017-01-12 DIAGNOSIS — E119 Type 2 diabetes mellitus without complications: Secondary | ICD-10-CM

## 2017-01-15 ENCOUNTER — Telehealth: Payer: Self-pay | Admitting: *Deleted

## 2017-01-15 MED ORDER — CLONIDINE HCL 0.1 MG PO TABS: ORAL_TABLET | ORAL | 3 refills | Status: DC

## 2017-01-15 NOTE — Telephone Encounter (Signed)
Patient called to report side effects taking Amlodipine (sob, heart fluttering). She says it lasted all day long and she can't take this medication. She wants to know what  Changes can be made. This am bp 203/95 she took 20 mg Lisinopril, Carvedilol 25mg , and Lasix 20 mg. Bp came down 174/81, please advise.

## 2017-01-15 NOTE — Telephone Encounter (Signed)
Stop Amlodipine.  Send in Clonidine (Catapres) 0.1 mg., she can take 1 tab up to 3 times a day for any BPs >170.  Hold dose if BP lower than that when it is time to take it.  Send in #90 / 6 refills).  Have her keep appt for f/u.-jh

## 2017-01-18 ENCOUNTER — Ambulatory Visit (INDEPENDENT_AMBULATORY_CARE_PROVIDER_SITE_OTHER): Payer: Commercial Managed Care - HMO | Admitting: Family Medicine

## 2017-01-18 ENCOUNTER — Encounter: Payer: Self-pay | Admitting: Family Medicine

## 2017-01-18 ENCOUNTER — Ambulatory Visit: Payer: Commercial Managed Care - HMO | Admitting: Family Medicine

## 2017-01-18 ENCOUNTER — Encounter: Payer: Self-pay | Admitting: *Deleted

## 2017-01-18 VITALS — BP 140/70 | HR 61 | Temp 98.2°F | Resp 16 | Ht 69.0 in | Wt 210.0 lb

## 2017-01-18 DIAGNOSIS — N182 Chronic kidney disease, stage 2 (mild): Secondary | ICD-10-CM | POA: Diagnosis not present

## 2017-01-18 DIAGNOSIS — E1121 Type 2 diabetes mellitus with diabetic nephropathy: Secondary | ICD-10-CM | POA: Diagnosis not present

## 2017-01-18 DIAGNOSIS — I509 Heart failure, unspecified: Secondary | ICD-10-CM | POA: Diagnosis not present

## 2017-01-18 DIAGNOSIS — I272 Pulmonary hypertension, unspecified: Secondary | ICD-10-CM

## 2017-01-18 DIAGNOSIS — F17200 Nicotine dependence, unspecified, uncomplicated: Secondary | ICD-10-CM | POA: Diagnosis not present

## 2017-01-18 DIAGNOSIS — M1990 Unspecified osteoarthritis, unspecified site: Secondary | ICD-10-CM | POA: Diagnosis not present

## 2017-01-18 DIAGNOSIS — E785 Hyperlipidemia, unspecified: Secondary | ICD-10-CM | POA: Diagnosis not present

## 2017-01-18 DIAGNOSIS — J432 Centrilobular emphysema: Secondary | ICD-10-CM

## 2017-01-18 DIAGNOSIS — J42 Unspecified chronic bronchitis: Secondary | ICD-10-CM | POA: Diagnosis not present

## 2017-01-18 DIAGNOSIS — G4733 Obstructive sleep apnea (adult) (pediatric): Secondary | ICD-10-CM | POA: Diagnosis not present

## 2017-01-18 DIAGNOSIS — I10 Essential (primary) hypertension: Secondary | ICD-10-CM

## 2017-01-18 DIAGNOSIS — I270 Primary pulmonary hypertension: Secondary | ICD-10-CM | POA: Diagnosis not present

## 2017-01-18 DIAGNOSIS — R0902 Hypoxemia: Secondary | ICD-10-CM | POA: Diagnosis not present

## 2017-01-18 LAB — CBC WITH DIFFERENTIAL/PLATELET
BASOS ABS: 105 {cells}/uL (ref 0–200)
Basophils Relative: 1 %
EOS ABS: 210 {cells}/uL (ref 15–500)
Eosinophils Relative: 2 %
HEMATOCRIT: 38.4 % (ref 35.0–45.0)
Hemoglobin: 12.1 g/dL (ref 11.7–15.5)
LYMPHS PCT: 20 %
Lymphs Abs: 2100 cells/uL (ref 850–3900)
MCH: 28.7 pg (ref 27.0–33.0)
MCHC: 31.5 g/dL — AB (ref 32.0–36.0)
MCV: 91.2 fL (ref 80.0–100.0)
MONO ABS: 735 {cells}/uL (ref 200–950)
MPV: 10.7 fL (ref 7.5–12.5)
Monocytes Relative: 7 %
NEUTROS PCT: 70 %
Neutro Abs: 7350 cells/uL (ref 1500–7800)
Platelets: 184 10*3/uL (ref 140–400)
RBC: 4.21 MIL/uL (ref 3.80–5.10)
RDW: 13.7 % (ref 11.0–15.0)
WBC: 10.5 10*3/uL (ref 3.8–10.8)

## 2017-01-18 NOTE — Progress Notes (Signed)
Name: Megan Bolton   MRN: [id number]    DOB: 05/14/1941   Date:01/18/2017       Progress Note  Subjective  Chief Complaint  Chief Complaint  Patient presents with  . Hypertension  . Headache  . Palpitations  . Dizziness    HPI For f/u of high BP.  She feels dizzy and lightheaded all the time for several weeks, but worse past few days.  She takes Clonidine only about once daily, and BP goes down under 170 sys. She still feels that heart flutters on and off.  Eyesight is blurry.  Swelling in feet is doing well.  More SOB, and worse today.    No problem-specific Assessment & Plan notes found for this encounter.   Past Medical History:  Diagnosis Date  . COPD (chronic obstructive pulmonary disease) ([place])   . Diabetes mellitus without complication ([place])   . Hyperlipidemia   . Hypertension     Past Surgical History:  Procedure Laterality Date  . ABDOMINAL HYSTERECTOMY    . SPINE SURGERY      Family History  Problem Relation Age of Onset  . Hypertension Paternal Grandfather     Social History   Social History  . Marital status: Widowed    Spouse name: N/A  . Number of children: N/A  . Years of education: N/A   Occupational History  . retired    Social History Main Topics  . Smoking status: Former Smoker    Packs/day: 0.50    Quit date: 04/28/2015  . Smokeless tobacco: Never Used  . Alcohol use No  . Drug use: No  . Sexual activity: Not on file   Other Topics Concern  . Not on file   Social History Narrative  . No narrative on file     Current Outpatient Prescriptions:  .  ACCU-CHEK AVIVA PLUS test strip, 1 strip by Other route 2 (two) times daily., Disp: , Rfl:  .  ACCU-CHEK SOFTCLIX LANCETS lancets, 1 each by Other route 2 (two) times daily., Disp: , Rfl:  .  albuterol (PROVENTIL) (2.5 MG/3ML) 0.083% nebulizer solution, Take 3 mLs (2.5 mg total) by nebulization every 6 (six) hours as needed for wheezing., Disp: 180 vial, Rfl: 12 .  albuterol (VENTOLIN  HFA) 108 (90 Base) MCG/ACT inhaler, Inhale 2 puffs into the lungs every 6 (six) hours as needed for wheezing or shortness of breath., Disp: 2 Inhaler, Rfl: 12 .  Blood Glucose Monitoring Suppl (ACCU-CHEK AVIVA PLUS) w/Device KIT, USE AS DIRECTED, Disp: 1 kit, Rfl: 12 .  carvedilol (COREG) 25 MG tablet, Take 1 tablet (25 mg total) by mouth 2 (two) times daily., Disp: 180 tablet, Rfl: 3 .  clonazePAM (KLONOPIN) 0.5 MG tablet, 0.5 mg at bedtime. , Disp: , Rfl:  .  cloNIDine (CATAPRES) 0.1 MG tablet, Take 1 tab up to 3 times a day for any BPs >170.  Hold dose if BP lower than 170., Disp: 90 tablet, Rfl: 3 .  Fluticasone-Salmeterol (ADVAIR DISKUS) 250-50 MCG/DOSE AEPB, Inhale 1 puff into the lungs 2 (two) times daily., Disp: 1 each, Rfl: 12 .  furosemide (LASIX) 20 MG tablet, TAKE ONE TABLET BY MOUTH EVERY DAY, Disp: 30 tablet, Rfl: 6 .  gabapentin (NEURONTIN) 800 MG tablet, Take 1 tablet (800 mg total) by mouth 3 (three) times daily., Disp: 270 tablet, Rfl: 3 .  lisinopril (PRINIVIL,ZESTRIL) 20 MG tablet, Take 1 tablet (20 mg total) by mouth daily. (Patient taking differently: Take 40 mg by mouth  daily. Patient taking 40 mg/d now.), Disp: 90 tablet, Rfl: 3 .  metFORMIN (GLUCOPHAGE) 500 MG tablet, Take 1 tablet (500 mg total) by mouth daily with breakfast., Disp: 30 tablet, Rfl: 12 .  montelukast (SINGULAIR) 10 MG tablet, Take 1 tablet (10 mg total) by mouth at bedtime., Disp: 30 tablet, Rfl: 12 .  mupirocin ointment (BACTROBAN) 2 %, One application as directed three times a day., Disp: 22 g, Rfl: 3 .  OXYCONTIN 10 MG 12 hr tablet, Take 10 mg by mouth 2 (two) times daily., Disp: , Rfl:  .  OXYGEN, Inhale 2 L into the lungs continuous., Disp: , Rfl:  .  potassium chloride (K-DUR) 10 MEQ tablet, Take 1 tablet (10 mEq total) by mouth 2 (two) times daily., Disp: 60 tablet, Rfl: 5 .  SPIRIVA HANDIHALER 18 MCG inhalation capsule, Place 1 capsule (18 mcg total) into inhaler and inhale daily., Disp: 30 capsule,  Rfl: 6 .  tiZANidine (ZANAFLEX) 4 MG tablet, Take 4 mg by mouth daily., Disp: , Rfl:  .  valACYclovir (VALTREX) 1000 MG tablet, Take 1 tablet (1,000 mg total) by mouth daily as needed (for fever blisters)., Disp: 30 tablet, Rfl: 12  Allergies  Allergen Reactions  . Nitrofurantoin Anaphylaxis     Review of Systems  Constitutional: Negative for chills, fever, malaise/fatigue and weight loss.  HENT: Negative for congestion, hearing loss and tinnitus.   Eyes: Negative for blurred vision and double vision.  Respiratory: Positive for shortness of breath. Negative for cough and wheezing.   Cardiovascular: Positive for palpitations. Negative for chest pain and leg swelling.  Gastrointestinal: Negative for abdominal pain, blood in stool, diarrhea and heartburn.  Genitourinary: Negative for dysuria, frequency and urgency.  Musculoskeletal: Positive for joint pain and myalgias.  Skin: Negative for rash.  Neurological: Positive for dizziness and headaches. Negative for tingling, tremors, sensory change, focal weakness and weakness.      Objective  Vitals:   01/18/17 1033 01/18/17 1125  BP: (!) 179/71 140/70  Pulse: 61   Resp: 16   Temp: 98.2 F (36.8 C)   TempSrc: Oral   SpO2:  94%  Weight: 210 lb (95.3 kg)   Height: 5\' 9"  (1.753 m)     Physical Exam  Constitutional: She is oriented to person, place, and time and well-developed, well-nourished, and in no distress. No distress.  HENT:  Head: Normocephalic and atraumatic.  Eyes: Conjunctivae and EOM are normal. Pupils are equal, round, and reactive to light. No scleral icterus.  Neck: Normal range of motion. Neck supple. Carotid bruit is not present. No thyromegaly present.  Cardiovascular: Normal rate and regular rhythm.  Exam reveals no gallop and no friction rub.   Murmur heard.  Systolic murmur is present with a grade of 2/6  throughout  Pulmonary/Chest: Effort normal. No respiratory distress. She has no wheezes. She has no  rales. She exhibits no tenderness.  Abdominal: Soft. Bowel sounds are normal. She exhibits no distension, no abdominal bruit and no mass. There is no tenderness.  Musculoskeletal: She exhibits no edema.  Lymphadenopathy:    She has no cervical adenopathy.  Neurological: She is alert and oriented to person, place, and time. No cranial nerve deficit. Coordination normal.  Walks with cane. Strength and movement of both U and L extremities are equal and normal./  Vitals reviewed.      Recent Results (from the past 2160 hour(s))  COMPLETE METABOLIC PANEL WITH GFR     Status: Abnormal   Collection Time:  12/01/16 12:01 AM  Result Value Ref Range   Sodium 142 135 - 146 mmol/L   Potassium 4.4 3.5 - 5.3 mmol/L   Chloride 105 98 - 110 mmol/L   CO2 30 20 - 31 mmol/L   Glucose, Bld 139 (H) 65 - 99 mg/dL   BUN 23 7 - 25 mg/dL   Creat 1.31 (H) 0.60 - 0.93 mg/dL    Comment:   For patients > or = 76 years of age: The upper reference limit for Creatinine is approximately 13% higher for people identified as African-American.      Total Bilirubin 0.5 0.2 - 1.2 mg/dL   Alkaline Phosphatase 47 33 - 130 U/L   AST 12 10 - 35 U/L   ALT 15 6 - 29 U/L   Total Protein 5.9 (L) 6.1 - 8.1 g/dL   Albumin 3.4 (L) 3.6 - 5.1 g/dL   Calcium 8.9 8.6 - 10.4 mg/dL   GFR, Est African American 46 (L) >=60 mL/min   GFR, Est Non African American 40 (L) >=60 mL/min  CBC with Differential     Status: Abnormal   Collection Time: 12/01/16 12:01 AM  Result Value Ref Range   WBC 10.8 3.8 - 10.8 K/uL   RBC 4.29 3.80 - 5.10 MIL/uL   Hemoglobin 12.1 11.7 - 15.5 g/dL   HCT 38.6 35.0 - 45.0 %   MCV 90.0 80.0 - 100.0 fL   MCH 28.2 27.0 - 33.0 pg   MCHC 31.3 (L) 32.0 - 36.0 g/dL   RDW 13.6 11.0 - 15.0 %   Platelets 190 140 - 400 K/uL   MPV 11.1 7.5 - 12.5 fL   Neutro Abs 6,696 1,500 - 7,800 cells/uL   Lymphs Abs 2,808 850 - 3,900 cells/uL   Monocytes Absolute 864 200 - 950 cells/uL   Eosinophils Absolute 324 15 -  500 cells/uL   Basophils Absolute 108 0 - 200 cells/uL   Neutrophils Relative % 62 %   Lymphocytes Relative 26 %   Monocytes Relative 8 %   Eosinophils Relative 3 %   Basophils Relative 1 %   Smear Review Criteria for review not met   Lipid Profile     Status: None   Collection Time: 12/01/16 12:01 AM  Result Value Ref Range   Cholesterol 154 <200 mg/dL   Triglycerides 90 <150 mg/dL   HDL 74 >50 mg/dL   Total CHOL/HDL Ratio 2.1 <5.0 Ratio   VLDL 18 <30 mg/dL   LDL Cholesterol 62 <100 mg/dL  HgB A1c     Status: Abnormal   Collection Time: 12/01/16 12:01 AM  Result Value Ref Range   Hgb A1c MFr Bld 6.4 (H) <5.7 %    Comment:   For someone without known diabetes, a hemoglobin A1c value between 5.7% and 6.4% is consistent with prediabetes and should be confirmed with a follow-up test.   For someone with known diabetes, a value <7% indicates that their diabetes is well controlled. A1c targets should be individualized based on duration of diabetes, age, co-morbid conditions and other considerations.   This assay result is consistent with an increased risk of diabetes.   Currently, no consensus exists regarding use of hemoglobin A1c for diagnosis of diabetes in children.      Mean Plasma Glucose 137 mg/dL  Urine Culture     Status: None   Collection Time: 12/16/16 12:01 AM  Result Value Ref Range   Culture ESCHERICHIA COLI     Comment: SOURCE: URINE&URINE  Confirmed Extended Spectrum Beta-Lactamase Producer (ESBL)    Colony Count >=100,000 COLONIES/ML    Organism ID, Bacteria ESCHERICHIA COLI       Susceptibility   Escherichia coli -  (no method available)    AMPICILLIN >=32 Resistant     AMOX/CLAVULANIC 4 Sensitive     AMPICILLIN/SULBACTAM 16 Intermediate     PIP/TAZO <=4 Sensitive     IMIPENEM <=0.25 Sensitive     CEFAZOLIN >=64 Resistant     CEFTRIAXONE >=64 Resistant     CEFTAZIDIME 16 Resistant     CEFEPIME  Resistant     GENTAMICIN <=1 Sensitive      TOBRAMYCIN <=1 Sensitive     CIPROFLOXACIN >=4 Resistant     LEVOFLOXACIN >=8 Resistant     NITROFURANTOIN <=16 Sensitive     TRIMETH/SULFA* >=320 Resistant      * NR=NOT REPORTABLE,SEE COMMENTORAL therapy:A cefazolin MIC of <32 predicts susceptibility to the oral agents cefaclor,cefdinir,cefpodoxime,cefprozil,cefuroxime,cephalexin,and loracarbef when used for therapy of uncomplicated UTIs due to E.coli,K.pneumomiae,and P.mirabilis. PARENTERAL therapy: A cefazolinMIC of >8 indicates resistance to parenteralcefazolin. An alternate test method must beperformed to confirm susceptibility to parenteralcefazolin.  POCT urinalysis dipstick     Status: Abnormal   Collection Time: 12/16/16 11:18 AM  Result Value Ref Range   Color, UA straw    Clarity, UA cloudy    Glucose, UA neg    Bilirubin, UA neg    Ketones, UA neg    Spec Grav, UA 1.010    Blood, UA neg    pH, UA 6.5    Protein, UA trace    Urobilinogen, UA 0.2    Nitrite, UA positive    Leukocytes, UA Negative Negative  Brain natriuretic peptide     Status: Abnormal   Collection Time: 12/24/16  8:13 PM  Result Value Ref Range   B Natriuretic Peptide 410.0 (H) 0.0 - 100.0 pg/mL  CBC with Differential     Status: Abnormal   Collection Time: 12/24/16  8:13 PM  Result Value Ref Range   WBC 8.8 3.6 - 11.0 K/uL   RBC 4.20 3.80 - 5.20 MIL/uL   Hemoglobin 12.3 12.0 - 16.0 g/dL   HCT 37.4 35.0 - 47.0 %   MCV 89.1 80.0 - 100.0 fL   MCH 29.4 26.0 - 34.0 pg   MCHC 33.0 32.0 - 36.0 g/dL   RDW 14.2 11.5 - 14.5 %   Platelets 196 150 - 440 K/uL   Neutrophils Relative % 90 %   Neutro Abs 7.9 (H) 1.4 - 6.5 K/uL   Lymphocytes Relative 8 %   Lymphs Abs 0.7 (L) 1.0 - 3.6 K/uL   Monocytes Relative 1 %   Monocytes Absolute 0.1 (L) 0.2 - 0.9 K/uL   Eosinophils Relative 0 %   Eosinophils Absolute 0.0 0 - 0.7 K/uL   Basophils Relative 1 %   Basophils Absolute 0.1 0 - 0.1 K/uL  Comprehensive metabolic panel     Status: Abnormal   Collection Time:  12/24/16  8:13 PM  Result Value Ref Range   Sodium 138 135 - 145 mmol/L   Potassium 3.9 3.5 - 5.1 mmol/L   Chloride 101 101 - 111 mmol/L   CO2 29 22 - 32 mmol/L   Glucose, Bld 267 (H) 65 - 99 mg/dL   BUN 20 6 - 20 mg/dL   Creatinine, Ser 1.07 (H) 0.44 - 1.00 mg/dL   Calcium 8.9 8.9 - 10.3 mg/dL   Total Protein 6.8 6.5 - 8.1 g/dL  Albumin 3.6 3.5 - 5.0 g/dL   AST 22 15 - 41 U/L   ALT 31 14 - 54 U/L   Alkaline Phosphatase 52 38 - 126 U/L   Total Bilirubin 1.1 0.3 - 1.2 mg/dL   GFR calc non Af Amer 49 (L) >60 mL/min   GFR calc Af Amer 57 (L) >60 mL/min    Comment: (NOTE) The eGFR has been calculated using the [name] EPI equation. This calculation has not been validated in all clinical situations. eGFR's persistently <60 mL/min signify possible Chronic Kidney Disease.    Anion gap 8 5 - 15  Troponin I     Status: Abnormal   Collection Time: 12/24/16  8:13 PM  Result Value Ref Range   Troponin I 0.03 (HH) <0.03 ng/mL    Comment: CRITICAL RESULT CALLED TO, READ BACK BY AND VERIFIED WITH TERESA HUDSON 12/24/16 @ 2049  [place]   Urinalysis, Complete w Microscopic     Status: Abnormal   Collection Time: 12/24/16  9:30 PM  Result Value Ref Range   Color, Urine YELLOW (A) YELLOW   APPearance CLEAR (A) CLEAR   Specific Gravity, Urine 1.007 1.005 - 1.030   pH 7.0 5.0 - 8.0   Glucose, UA >=500 (A) NEGATIVE mg/dL   Hgb urine dipstick NEGATIVE NEGATIVE   Bilirubin Urine NEGATIVE NEGATIVE   Ketones, ur NEGATIVE NEGATIVE mg/dL   Protein, ur NEGATIVE NEGATIVE mg/dL   Nitrite POSITIVE (A) NEGATIVE   Leukocytes, UA NEGATIVE NEGATIVE   RBC / HPF NONE SEEN 0 - 5 RBC/hpf   WBC, UA 0-5 0 - 5 WBC/hpf   Bacteria, UA MANY (A) NONE SEEN   Squamous Epithelial / LPF 0-5 (A) NONE SEEN   Mucous PRESENT   Urine culture     Status: Abnormal   Collection Time: 12/24/16  9:30 PM  Result Value Ref Range   Specimen Description URINE, RANDOM    Special Requests NONE    Culture (A)     >=100,000  COLONIES/mL ESCHERICHIA COLI Confirmed Extended Spectrum Beta-Lactamase Producer (ESBL) Performed at [facility]    Report Status 12/27/2016 FINAL    Organism ID, Bacteria ESCHERICHIA COLI (A)       Susceptibility   Escherichia coli - MIC*    AMPICILLIN >=32 RESISTANT Resistant     CEFAZOLIN >=64 RESISTANT Resistant     CEFTRIAXONE >=64 RESISTANT Resistant     CIPROFLOXACIN >=4 RESISTANT Resistant     GENTAMICIN <=1 SENSITIVE Sensitive     IMIPENEM <=0.25 SENSITIVE Sensitive     NITROFURANTOIN <=16 SENSITIVE Sensitive     TRIMETH/SULFA >=320 RESISTANT Resistant     AMPICILLIN/SULBACTAM 16 INTERMEDIATE Intermediate     PIP/TAZO <=4 SENSITIVE Sensitive     Extended ESBL POSITIVE Resistant     * >=100,000 COLONIES/mL ESCHERICHIA COLI  Troponin I     Status: Abnormal   Collection Time: 12/24/16 11:15 PM  Result Value Ref Range   Troponin I 0.03 (HH) <0.03 ng/mL    Comment: CRITICAL VALUE NOTED. VALUE IS CONSISTENT WITH PREVIOUSLY REPORTED/CALLED VALUE.MSS  CBC with Differential     Status: Abnormal   Collection Time: 01/06/17  4:02 PM  Result Value Ref Range   WBC 13.8 (H) 3.6 - 11.0 K/uL   RBC 4.29 3.80 - 5.20 MIL/uL   Hemoglobin 12.4 12.0 - 16.0 g/dL   HCT 38.4 35.0 - 47.0 %   MCV 89.4 80.0 - 100.0 fL   MCH 28.9 26.0 - 34.0 pg  MCHC 32.3 32.0 - 36.0 g/dL   RDW 14.0 11.5 - 14.5 %   Platelets 190 150 - 440 K/uL   Neutrophils Relative % 67 %   Neutro Abs 9.2 (H) 1.4 - 6.5 K/uL   Lymphocytes Relative 22 %   Lymphs Abs 3.0 1.0 - 3.6 K/uL   Monocytes Relative 9 %   Monocytes Absolute 1.2 (H) 0.2 - 0.9 K/uL   Eosinophils Relative 1 %   Eosinophils Absolute 0.2 0 - 0.7 K/uL   Basophils Relative 1 %   Basophils Absolute 0.1 0 - 0.1 K/uL  Comprehensive metabolic panel     Status: Abnormal   Collection Time: 01/06/17  4:02 PM  Result Value Ref Range   Sodium 141 135 - 145 mmol/L   Potassium 4.1 3.5 - 5.1 mmol/L   Chloride 101 101 - 111 mmol/L   CO2 33 (H) 22 - 32  mmol/L   Glucose, Bld 132 (H) 65 - 99 mg/dL   BUN 26 (H) 6 - 20 mg/dL   Creatinine, Ser 1.13 (H) 0.44 - 1.00 mg/dL   Calcium 9.3 8.9 - 10.3 mg/dL   Total Protein 6.3 (L) 6.5 - 8.1 g/dL   Albumin 3.6 3.5 - 5.0 g/dL   AST 19 15 - 41 U/L   ALT 25 14 - 54 U/L   Alkaline Phosphatase 50 38 - 126 U/L   Total Bilirubin 0.3 0.3 - 1.2 mg/dL   GFR calc non Af Amer 46 (L) >60 mL/min   GFR calc Af Amer 54 (L) >60 mL/min    Comment: (NOTE) The eGFR has been calculated using the [name] EPI equation. This calculation has not been validated in all clinical situations. eGFR's persistently <60 mL/min signify possible Chronic Kidney Disease.    Anion gap 7 5 - 15     Assessment & Plan  Problem List Items Addressed This Visit      Cardiovascular and Mediastinum   Essential hypertension - Primary   Relevant Orders   Ambulatory referral to Cardiology   COMPLETE METABOLIC PANEL WITH GFR   Hypertensive pulmonary vascular disease     Respiratory   COPD (chronic obstructive pulmonary disease) (HCC)     Endocrine   Diabetes mellitus, type 2 (HCC)     Genitourinary   [name] (chronic kidney disease)   Relevant Orders   CBC w/Diff/Platelet      No orders of the defined types were placed in this encounter.  1. Essential hypertension  - Ambulatory referral to Cardiology - COMPLETE METABOLIC PANEL WITH GFR  2. Hypertensive pulmonary vascular disease   3. Centrilobular emphysema ([place])   4. Type 2 diabetes mellitus with diabetic nephropathy, without long-term current use of insulin (HCC)   5. Stage 2 chronic kidney disease  - CBC w/Diff/Platelet   Continue all current meds at same doses.

## 2017-01-19 ENCOUNTER — Ambulatory Visit: Payer: Commercial Managed Care - HMO | Admitting: Family Medicine

## 2017-01-19 LAB — COMPLETE METABOLIC PANEL WITH GFR
ALBUMIN: 3.8 g/dL (ref 3.6–5.1)
ALT: 16 U/L (ref 6–29)
AST: 16 U/L (ref 10–35)
Alkaline Phosphatase: 49 U/L (ref 33–130)
BUN: 17 mg/dL (ref 7–25)
CALCIUM: 9 mg/dL (ref 8.6–10.4)
CHLORIDE: 104 mmol/L (ref 98–110)
CO2: 30 mmol/L (ref 20–31)
CREATININE: 1.06 mg/dL — AB (ref 0.60–0.93)
GFR, Est African American: 59 mL/min — ABNORMAL LOW (ref >=60)
GFR, Est Non African American: 51 mL/min — ABNORMAL LOW (ref >=60)
GLUCOSE: 169 mg/dL — AB (ref 65–99)
Potassium: 4.3 mmol/L (ref 3.5–5.3)
Sodium: 140 mmol/L (ref 135–146)
Total Bilirubin: 0.7 mg/dL (ref 0.2–1.2)
Total Protein: 6.3 g/dL (ref 6.1–8.1)

## 2017-01-20 DIAGNOSIS — J439 Emphysema, unspecified: Secondary | ICD-10-CM | POA: Diagnosis not present

## 2017-01-20 DIAGNOSIS — J449 Chronic obstructive pulmonary disease, unspecified: Secondary | ICD-10-CM | POA: Diagnosis not present

## 2017-01-20 DIAGNOSIS — I272 Pulmonary hypertension, unspecified: Secondary | ICD-10-CM | POA: Diagnosis not present

## 2017-01-20 DIAGNOSIS — R0609 Other forms of dyspnea: Secondary | ICD-10-CM | POA: Diagnosis not present

## 2017-01-20 DIAGNOSIS — G4733 Obstructive sleep apnea (adult) (pediatric): Secondary | ICD-10-CM | POA: Diagnosis not present

## 2017-01-21 DIAGNOSIS — R350 Frequency of micturition: Secondary | ICD-10-CM | POA: Diagnosis not present

## 2017-01-25 DIAGNOSIS — R0902 Hypoxemia: Secondary | ICD-10-CM | POA: Diagnosis not present

## 2017-01-25 DIAGNOSIS — G4733 Obstructive sleep apnea (adult) (pediatric): Secondary | ICD-10-CM | POA: Diagnosis not present

## 2017-01-25 DIAGNOSIS — N289 Disorder of kidney and ureter, unspecified: Secondary | ICD-10-CM | POA: Diagnosis not present

## 2017-01-25 DIAGNOSIS — I701 Atherosclerosis of renal artery: Secondary | ICD-10-CM | POA: Diagnosis not present

## 2017-01-25 DIAGNOSIS — I270 Primary pulmonary hypertension: Secondary | ICD-10-CM | POA: Diagnosis not present

## 2017-01-25 DIAGNOSIS — F17200 Nicotine dependence, unspecified, uncomplicated: Secondary | ICD-10-CM | POA: Diagnosis not present

## 2017-01-25 DIAGNOSIS — I10 Essential (primary) hypertension: Secondary | ICD-10-CM | POA: Diagnosis not present

## 2017-01-25 DIAGNOSIS — E785 Hyperlipidemia, unspecified: Secondary | ICD-10-CM | POA: Diagnosis not present

## 2017-01-25 DIAGNOSIS — J42 Unspecified chronic bronchitis: Secondary | ICD-10-CM | POA: Diagnosis not present

## 2017-01-28 DIAGNOSIS — G4733 Obstructive sleep apnea (adult) (pediatric): Secondary | ICD-10-CM | POA: Diagnosis not present

## 2017-01-29 ENCOUNTER — Other Ambulatory Visit (INDEPENDENT_AMBULATORY_CARE_PROVIDER_SITE_OTHER): Payer: Medicare HMO

## 2017-01-29 ENCOUNTER — Other Ambulatory Visit (INDEPENDENT_AMBULATORY_CARE_PROVIDER_SITE_OTHER): Payer: Self-pay | Admitting: Internal Medicine

## 2017-01-29 ENCOUNTER — Ambulatory Visit (INDEPENDENT_AMBULATORY_CARE_PROVIDER_SITE_OTHER): Payer: Medicare HMO | Admitting: Vascular Surgery

## 2017-01-29 ENCOUNTER — Encounter (INDEPENDENT_AMBULATORY_CARE_PROVIDER_SITE_OTHER): Payer: Self-pay | Admitting: Vascular Surgery

## 2017-01-29 VITALS — BP 100/58 | HR 67 | Resp 16 | Ht 69.0 in | Wt 221.0 lb

## 2017-01-29 DIAGNOSIS — I10 Essential (primary) hypertension: Secondary | ICD-10-CM

## 2017-01-29 DIAGNOSIS — E7800 Pure hypercholesterolemia, unspecified: Secondary | ICD-10-CM

## 2017-01-29 DIAGNOSIS — E1121 Type 2 diabetes mellitus with diabetic nephropathy: Secondary | ICD-10-CM | POA: Diagnosis not present

## 2017-01-29 DIAGNOSIS — I509 Heart failure, unspecified: Secondary | ICD-10-CM | POA: Diagnosis not present

## 2017-01-29 NOTE — Assessment & Plan Note (Signed)
The patient has severe, difficult to control hypertension. She has a typical history for secondary hypertension with many years of well-controlled hypertension that has become difficult to control recently. Her renal artery duplex today did not suggest hemodynamically significant renal artery stenosis in either renal artery. We discussed that renal artery duplexes a good but not perfect study for renal artery stenosis. A small percentage of patients can still have hemodynamically significant stenosis driving elevated blood pressure despite a normal renal artery duplex. She reports that her blood pressure has been more well controlled recently. As long as her blood pressure control remains good, no further evaluation or testing will be planned. If however she develops worsening blood pressure despite the increase in her blood pressure medication regimen, consideration for renal artery angiogram will be given. She will contact our office in the future if this develops. We have discussed the natural history and pathophysiology of renal artery stenosis and why it causes high blood pressure.

## 2017-01-29 NOTE — Progress Notes (Signed)
Patient ID: Megan Bolton, female   DOB: 08-28-1941, 76 y.o.   MRN: [id number]  Chief Complaint  Patient presents with  . New Patient (Initial Visit)    HPI Megan Bolton is a 76 y.o. female.  I am asked to see the patient by Dr. Clayborn Bigness for evaluation of renal artery stenosis.  The patient reports many years of well-controlled hypertension. Over the past several months, her blood pressure has become much more difficult to control. Her highest blood pressure was 010 systolic and she was regularly running blood pressures of over 932 systolic despite medications. She recently had another agent added to her regimen which has brought her blood pressure under pretty good control. That has just been in the last 3 days. She has a litany of other medical issues and is oxygen dependent with her COPD. For further evaluation of her renal arteries as a potential cause of secondary hypertension, renal artery duplex was performed today. Her renal artery duplex today did not suggest hemodynamically significant renal artery stenosis in either renal artery.   Past Medical History:  Diagnosis Date  . COPD (chronic obstructive pulmonary disease) ([place])   . Diabetes mellitus without complication ([place])   . Hyperlipidemia   . Hypertension     Past Surgical History:  Procedure Laterality Date  . ABDOMINAL HYSTERECTOMY    . SPINE SURGERY      Family History  Problem Relation Age of Onset  . Hypertension Mother   . Cancer Father   . Hypertension Paternal Grandfather   No bleeding disorders, clotting disorders, or autoimmune diseases  Social History Social History  Substance Use Topics  . Smoking status: Former Smoker    Packs/day: 0.50    Quit date: 04/28/2015  . Smokeless tobacco: Never Used  . Alcohol use No  No IVDU  Allergies  Allergen Reactions  . Nitrofurantoin Anaphylaxis    Current Outpatient Prescriptions  Medication Sig Dispense Refill  . ACCU-CHEK AVIVA PLUS test strip 1  strip by Other route 2 (two) times daily.    Marland Kitchen ACCU-CHEK SOFTCLIX LANCETS lancets 1 each by Other route 2 (two) times daily.    Marland Kitchen albuterol (PROVENTIL) (2.5 MG/3ML) 0.083% nebulizer solution Take 3 mLs (2.5 mg total) by nebulization every 6 (six) hours as needed for wheezing. 180 vial 12  . albuterol (VENTOLIN HFA) 108 (90 Base) MCG/ACT inhaler Inhale 2 puffs into the lungs every 6 (six) hours as needed for wheezing or shortness of breath. 2 Inhaler 12  . Blood Glucose Monitoring Suppl (ACCU-CHEK AVIVA PLUS) w/Device KIT USE AS DIRECTED 1 kit 12  . carvedilol (COREG) 25 MG tablet Take 1 tablet (25 mg total) by mouth 2 (two) times daily. 180 tablet 3  . clonazePAM (KLONOPIN) 0.5 MG tablet 0.5 mg at bedtime.     . cloNIDine (CATAPRES) 0.1 MG tablet Take 1 tab up to 3 times a day for any BPs >170.  Hold dose if BP lower than 170. 90 tablet 3  . Fluticasone-Salmeterol (ADVAIR DISKUS) 250-50 MCG/DOSE AEPB Inhale 1 puff into the lungs 2 (two) times daily. 1 each 12  . furosemide (LASIX) 20 MG tablet TAKE ONE TABLET BY MOUTH EVERY DAY 30 tablet 6  . gabapentin (NEURONTIN) 800 MG tablet Take 1 tablet (800 mg total) by mouth 3 (three) times daily. 270 tablet 3  . lisinopril (PRINIVIL,ZESTRIL) 20 MG tablet Take 1 tablet (20 mg total) by mouth daily. (Patient taking differently: Take 40 mg by mouth daily. Patient  taking 40 mg/d now.) 90 tablet 3  . metFORMIN (GLUCOPHAGE) 500 MG tablet Take 1 tablet (500 mg total) by mouth daily with breakfast. 30 tablet 12  . montelukast (SINGULAIR) 10 MG tablet Take 1 tablet (10 mg total) by mouth at bedtime. 30 tablet 12  . mupirocin ointment (BACTROBAN) 2 % One application as directed three times a day. 22 g 3  . OXYCONTIN 10 MG 12 hr tablet Take 10 mg by mouth 2 (two) times daily.    . OXYGEN Inhale 2 L into the lungs continuous.    . potassium chloride (K-DUR) 10 MEQ tablet Take 1 tablet (10 mEq total) by mouth 2 (two) times daily. 60 tablet 5  . SPIRIVA HANDIHALER 18  MCG inhalation capsule Place 1 capsule (18 mcg total) into inhaler and inhale daily. 30 capsule 6  . tiZANidine (ZANAFLEX) 4 MG tablet Take 4 mg by mouth daily.    . valACYclovir (VALTREX) 1000 MG tablet Take 1 tablet (1,000 mg total) by mouth daily as needed (for fever blisters). 30 tablet 12   No current facility-administered medications for this visit.       REVIEW OF SYSTEMS (Negative unless checked)  Constitutional: [] Weight loss  [] Fever  [] Chills Cardiac: [] Chest pain   [] Chest pressure   [] Palpitations   [] Shortness of breath when laying flat   [] Shortness of breath at rest   [x] Shortness of breath with exertion. Vascular:  [] Pain in legs with walking   [] Pain in legs at rest   [] Pain in legs when laying flat   [] Claudication   [] Pain in feet when walking  [] Pain in feet at rest  [] Pain in feet when laying flat   [] History of DVT   [] Phlebitis   [x] Swelling in legs   [] Varicose veins   [] Non-healing ulcers Pulmonary:   [] Uses home oxygen   [] Productive cough   [] Hemoptysis   [] Wheeze  [x] COPD   [] Asthma Neurologic:  [] Dizziness  [] Blackouts   [] Seizures   [] History of stroke   [] History of TIA  [] Aphasia   [] Temporary blindness   [] Dysphagia   [] Weakness or numbness in arms   [] Weakness or numbness in legs Musculoskeletal:  [] Arthritis   [] Joint swelling   [] Joint pain   [] Low back pain Hematologic:  [] Easy bruising  [] Easy bleeding   [] Hypercoagulable state   [] Anemic  [] Hepatitis Gastrointestinal:  [] Blood in stool   [] Vomiting blood  [] Gastroesophageal reflux/heartburn   [] Abdominal pain Genitourinary:  [x] Chronic kidney disease   [] Difficult urination  [] Frequent urination  [] Burning with urination   [] Hematuria Skin:  [] Rashes   [] Ulcers   [] Wounds Psychological:  [] History of anxiety   []  History of major depression.    Physical Exam BP (!) 100/58   Pulse 67   Resp 16   Ht 5' 9"  (1.753 m)   Wt 221 lb (100.2 kg)   BMI 32.64 kg/m  Gen:  WD/WN, NAD Head: [place]/AT, No  temporalis wasting. Prominent temp pulse not noted. Ear/Nose/Throat: Hearing grossly intact, nares w/o erythema or drainage, oropharynx w/o Erythema/Exudate Eyes: Conjunctiva clear, sclera non-icteric  Neck: trachea midline.  No JVD.  Pulmonary:  Good air movement, respirations not labored on supplemental oxygen Cardiac: RRR, normal S1, S2 Vascular:  Vessel Right Left  Radial Palpable Palpable                                   Gastrointestinal: soft, non-tender/non-distended.  No masses,  surgical incisions, or scars. Musculoskeletal: M/S 5/5 throughout.  Extremities without ischemic changes.  No deformity or atrophy. 1+ BLE edema. Neurologic: Sensation grossly intact in extremities.  Symmetrical.  Speech is fluent. Motor exam as listed above. Psychiatric: Judgment intact, Mood & affect appropriate for pt's clinical situation. Dermatologic: No rashes or ulcers noted.  No cellulitis or open wounds. Lymph : No Cervical, Axillary, or Inguinal lymphadenopathy.   Radiology No results found.  Labs Recent Results (from the past 2160 hour(s))  COMPLETE METABOLIC PANEL WITH GFR     Status: Abnormal   Collection Time: 12/01/16 12:01 AM  Result Value Ref Range   Sodium 142 135 - 146 mmol/L   Potassium 4.4 3.5 - 5.3 mmol/L   Chloride 105 98 - 110 mmol/L   CO2 30 20 - 31 mmol/L   Glucose, Bld 139 (H) 65 - 99 mg/dL   BUN 23 7 - 25 mg/dL   Creat 1.31 (H) 0.60 - 0.93 mg/dL    Comment:   For patients > or = 76 years of age: The upper reference limit for Creatinine is approximately 13% higher for people identified as African-American.      Total Bilirubin 0.5 0.2 - 1.2 mg/dL   Alkaline Phosphatase 47 33 - 130 U/L   AST 12 10 - 35 U/L   ALT 15 6 - 29 U/L   Total Protein 5.9 (L) 6.1 - 8.1 g/dL   Albumin 3.4 (L) 3.6 - 5.1 g/dL   Calcium 8.9 8.6 - 10.4 mg/dL   GFR, Est African American 46 (L) >=60 mL/min   GFR, Est Non African American 40 (L) >=60 mL/min  CBC with Differential      Status: Abnormal   Collection Time: 12/01/16 12:01 AM  Result Value Ref Range   WBC 10.8 3.8 - 10.8 K/uL   RBC 4.29 3.80 - 5.10 MIL/uL   Hemoglobin 12.1 11.7 - 15.5 g/dL   HCT 38.6 35.0 - 45.0 %   MCV 90.0 80.0 - 100.0 fL   MCH 28.2 27.0 - 33.0 pg   MCHC 31.3 (L) 32.0 - 36.0 g/dL   RDW 13.6 11.0 - 15.0 %   Platelets 190 140 - 400 K/uL   MPV 11.1 7.5 - 12.5 fL   Neutro Abs 6,696 1,500 - 7,800 cells/uL   Lymphs Abs 2,808 850 - 3,900 cells/uL   Monocytes Absolute 864 200 - 950 cells/uL   Eosinophils Absolute 324 15 - 500 cells/uL   Basophils Absolute 108 0 - 200 cells/uL   Neutrophils Relative % 62 %   Lymphocytes Relative 26 %   Monocytes Relative 8 %   Eosinophils Relative 3 %   Basophils Relative 1 %   Smear Review Criteria for review not met   Lipid Profile     Status: None   Collection Time: 12/01/16 12:01 AM  Result Value Ref Range   Cholesterol 154 <200 mg/dL   Triglycerides 90 <150 mg/dL   HDL 74 >50 mg/dL   Total CHOL/HDL Ratio 2.1 <5.0 Ratio   VLDL 18 <30 mg/dL   LDL Cholesterol 62 <100 mg/dL  HgB A1c     Status: Abnormal   Collection Time: 12/01/16 12:01 AM  Result Value Ref Range   Hgb A1c MFr Bld 6.4 (H) <5.7 %    Comment:   For someone without known diabetes, a hemoglobin A1c value between 5.7% and 6.4% is consistent with prediabetes and should be confirmed with a follow-up test.   For someone with known  diabetes, a value <7% indicates that their diabetes is well controlled. A1c targets should be individualized based on duration of diabetes, age, co-morbid conditions and other considerations.   This assay result is consistent with an increased risk of diabetes.   Currently, no consensus exists regarding use of hemoglobin A1c for diagnosis of diabetes in children.      Mean Plasma Glucose 137 mg/dL  Urine Culture     Status: None   Collection Time: 12/16/16 12:01 AM  Result Value Ref Range   Culture ESCHERICHIA COLI     Comment: SOURCE:  URINE&URINE Confirmed Extended Spectrum Beta-Lactamase Producer (ESBL)    Colony Count >=100,000 COLONIES/ML    Organism ID, Bacteria ESCHERICHIA COLI       Susceptibility   Escherichia coli -  (no method available)    AMPICILLIN >=32 Resistant     AMOX/CLAVULANIC 4 Sensitive     AMPICILLIN/SULBACTAM 16 Intermediate     PIP/TAZO <=4 Sensitive     IMIPENEM <=0.25 Sensitive     CEFAZOLIN >=64 Resistant     CEFTRIAXONE >=64 Resistant     CEFTAZIDIME 16 Resistant     CEFEPIME  Resistant     GENTAMICIN <=1 Sensitive     TOBRAMYCIN <=1 Sensitive     CIPROFLOXACIN >=4 Resistant     LEVOFLOXACIN >=8 Resistant     NITROFURANTOIN <=16 Sensitive     TRIMETH/SULFA* >=320 Resistant      * NR=NOT REPORTABLE,SEE COMMENTORAL therapy:A cefazolin MIC of <32 predicts susceptibility to the oral agents cefaclor,cefdinir,cefpodoxime,cefprozil,cefuroxime,cephalexin,and loracarbef when used for therapy of uncomplicated UTIs due to E.coli,K.pneumomiae,and P.mirabilis. PARENTERAL therapy: A cefazolinMIC of >8 indicates resistance to parenteralcefazolin. An alternate test method must beperformed to confirm susceptibility to parenteralcefazolin.  POCT urinalysis dipstick     Status: Abnormal   Collection Time: 12/16/16 11:18 AM  Result Value Ref Range   Color, UA straw    Clarity, UA cloudy    Glucose, UA neg    Bilirubin, UA neg    Ketones, UA neg    Spec Grav, UA 1.010    Blood, UA neg    pH, UA 6.5    Protein, UA trace    Urobilinogen, UA 0.2    Nitrite, UA positive    Leukocytes, UA Negative Negative  Brain natriuretic peptide     Status: Abnormal   Collection Time: 12/24/16  8:13 PM  Result Value Ref Range   B Natriuretic Peptide 410.0 (H) 0.0 - 100.0 pg/mL  CBC with Differential     Status: Abnormal   Collection Time: 12/24/16  8:13 PM  Result Value Ref Range   WBC 8.8 3.6 - 11.0 K/uL   RBC 4.20 3.80 - 5.20 MIL/uL   Hemoglobin 12.3 12.0 - 16.0 g/dL   HCT 37.4 35.0 - 47.0 %   MCV 89.1 80.0  - 100.0 fL   MCH 29.4 26.0 - 34.0 pg   MCHC 33.0 32.0 - 36.0 g/dL   RDW 14.2 11.5 - 14.5 %   Platelets 196 150 - 440 K/uL   Neutrophils Relative % 90 %   Neutro Abs 7.9 (H) 1.4 - 6.5 K/uL   Lymphocytes Relative 8 %   Lymphs Abs 0.7 (L) 1.0 - 3.6 K/uL   Monocytes Relative 1 %   Monocytes Absolute 0.1 (L) 0.2 - 0.9 K/uL   Eosinophils Relative 0 %   Eosinophils Absolute 0.0 0 - 0.7 K/uL   Basophils Relative 1 %   Basophils Absolute 0.1 0 - 0.1 K/uL  Comprehensive metabolic panel     Status: Abnormal   Collection Time: 12/24/16  8:13 PM  Result Value Ref Range   Sodium 138 135 - 145 mmol/L   Potassium 3.9 3.5 - 5.1 mmol/L   Chloride 101 101 - 111 mmol/L   CO2 29 22 - 32 mmol/L   Glucose, Bld 267 (H) 65 - 99 mg/dL   BUN 20 6 - 20 mg/dL   Creatinine, Ser 1.07 (H) 0.44 - 1.00 mg/dL   Calcium 8.9 8.9 - 10.3 mg/dL   Total Protein 6.8 6.5 - 8.1 g/dL   Albumin 3.6 3.5 - 5.0 g/dL   AST 22 15 - 41 U/L   ALT 31 14 - 54 U/L   Alkaline Phosphatase 52 38 - 126 U/L   Total Bilirubin 1.1 0.3 - 1.2 mg/dL   GFR calc non Af Amer 49 (L) >60 mL/min   GFR calc Af Amer 57 (L) >60 mL/min    Comment: (NOTE) The eGFR has been calculated using the [name] EPI equation. This calculation has not been validated in all clinical situations. eGFR's persistently <60 mL/min signify possible Chronic Kidney Disease.    Anion gap 8 5 - 15  Troponin I     Status: Abnormal   Collection Time: 12/24/16  8:13 PM  Result Value Ref Range   Troponin I 0.03 (HH) <0.03 ng/mL    Comment: CRITICAL RESULT CALLED TO, READ BACK BY AND VERIFIED WITH TERESA HUDSON 12/24/16 @ 2049  [place]   Urinalysis, Complete w Microscopic     Status: Abnormal   Collection Time: 12/24/16  9:30 PM  Result Value Ref Range   Color, Urine YELLOW (A) YELLOW   APPearance CLEAR (A) CLEAR   Specific Gravity, Urine 1.007 1.005 - 1.030   pH 7.0 5.0 - 8.0   Glucose, UA >=500 (A) NEGATIVE mg/dL   Hgb urine dipstick NEGATIVE NEGATIVE   Bilirubin Urine  NEGATIVE NEGATIVE   Ketones, ur NEGATIVE NEGATIVE mg/dL   Protein, ur NEGATIVE NEGATIVE mg/dL   Nitrite POSITIVE (A) NEGATIVE   Leukocytes, UA NEGATIVE NEGATIVE   RBC / HPF NONE SEEN 0 - 5 RBC/hpf   WBC, UA 0-5 0 - 5 WBC/hpf   Bacteria, UA MANY (A) NONE SEEN   Squamous Epithelial / LPF 0-5 (A) NONE SEEN   Mucous PRESENT   Urine culture     Status: Abnormal   Collection Time: 12/24/16  9:30 PM  Result Value Ref Range   Specimen Description URINE, RANDOM    Special Requests NONE    Culture (A)     >=100,000 COLONIES/mL ESCHERICHIA COLI Confirmed Extended Spectrum Beta-Lactamase Producer (ESBL) Performed at [facility]    Report Status 12/27/2016 FINAL    Organism ID, Bacteria ESCHERICHIA COLI (A)       Susceptibility   Escherichia coli - MIC*    AMPICILLIN >=32 RESISTANT Resistant     CEFAZOLIN >=64 RESISTANT Resistant     CEFTRIAXONE >=64 RESISTANT Resistant     CIPROFLOXACIN >=4 RESISTANT Resistant     GENTAMICIN <=1 SENSITIVE Sensitive     IMIPENEM <=0.25 SENSITIVE Sensitive     NITROFURANTOIN <=16 SENSITIVE Sensitive     TRIMETH/SULFA >=320 RESISTANT Resistant     AMPICILLIN/SULBACTAM 16 INTERMEDIATE Intermediate     PIP/TAZO <=4 SENSITIVE Sensitive     Extended ESBL POSITIVE Resistant     * >=100,000 COLONIES/mL ESCHERICHIA COLI  Troponin I     Status: Abnormal   Collection Time: 12/24/16 11:15 PM  Result Value Ref Range   Troponin I 0.03 (HH) <0.03 ng/mL    Comment: CRITICAL VALUE NOTED. VALUE IS CONSISTENT WITH PREVIOUSLY REPORTED/CALLED VALUE.MSS  CBC with Differential     Status: Abnormal   Collection Time: 01/06/17  4:02 PM  Result Value Ref Range   WBC 13.8 (H) 3.6 - 11.0 K/uL   RBC 4.29 3.80 - 5.20 MIL/uL   Hemoglobin 12.4 12.0 - 16.0 g/dL   HCT 38.4 35.0 - 47.0 %   MCV 89.4 80.0 - 100.0 fL   MCH 28.9 26.0 - 34.0 pg   MCHC 32.3 32.0 - 36.0 g/dL   RDW 14.0 11.5 - 14.5 %   Platelets 190 150 - 440 K/uL   Neutrophils Relative % 67 %   Neutro Abs 9.2  (H) 1.4 - 6.5 K/uL   Lymphocytes Relative 22 %   Lymphs Abs 3.0 1.0 - 3.6 K/uL   Monocytes Relative 9 %   Monocytes Absolute 1.2 (H) 0.2 - 0.9 K/uL   Eosinophils Relative 1 %   Eosinophils Absolute 0.2 0 - 0.7 K/uL   Basophils Relative 1 %   Basophils Absolute 0.1 0 - 0.1 K/uL  Comprehensive metabolic panel     Status: Abnormal   Collection Time: 01/06/17  4:02 PM  Result Value Ref Range   Sodium 141 135 - 145 mmol/L   Potassium 4.1 3.5 - 5.1 mmol/L   Chloride 101 101 - 111 mmol/L   CO2 33 (H) 22 - 32 mmol/L   Glucose, Bld 132 (H) 65 - 99 mg/dL   BUN 26 (H) 6 - 20 mg/dL   Creatinine, Ser 1.13 (H) 0.44 - 1.00 mg/dL   Calcium 9.3 8.9 - 10.3 mg/dL   Total Protein 6.3 (L) 6.5 - 8.1 g/dL   Albumin 3.6 3.5 - 5.0 g/dL   AST 19 15 - 41 U/L   ALT 25 14 - 54 U/L   Alkaline Phosphatase 50 38 - 126 U/L   Total Bilirubin 0.3 0.3 - 1.2 mg/dL   GFR calc non Af Amer 46 (L) >60 mL/min   GFR calc Af Amer 54 (L) >60 mL/min    Comment: (NOTE) The eGFR has been calculated using the [name] EPI equation. This calculation has not been validated in all clinical situations. eGFR's persistently <60 mL/min signify possible Chronic Kidney Disease.    Anion gap 7 5 - 15  COMPLETE METABOLIC PANEL WITH GFR     Status: Abnormal   Collection Time: 01/18/17 12:01 AM  Result Value Ref Range   Sodium 140 135 - 146 mmol/L   Potassium 4.3 3.5 - 5.3 mmol/L   Chloride 104 98 - 110 mmol/L   CO2 30 20 - 31 mmol/L   Glucose, Bld 169 (H) 65 - 99 mg/dL   BUN 17 7 - 25 mg/dL   Creat 1.06 (H) 0.60 - 0.93 mg/dL    Comment:   For patients > or = 76 years of age: The upper reference limit for Creatinine is approximately 13% higher for people identified as African-American.      Total Bilirubin 0.7 0.2 - 1.2 mg/dL   Alkaline Phosphatase 49 33 - 130 U/L   AST 16 10 - 35 U/L   ALT 16 6 - 29 U/L   Total Protein 6.3 6.1 - 8.1 g/dL   Albumin 3.8 3.6 - 5.1 g/dL   Calcium 9.0 8.6 - 10.4 mg/dL   GFR, Est African American  59 (L) >=60 mL/min   GFR, Est  Non African American 51 (L) >=60 mL/min  CBC w/Diff/Platelet     Status: Abnormal   Collection Time: 01/18/17 12:01 AM  Result Value Ref Range   WBC 10.5 3.8 - 10.8 K/uL   RBC 4.21 3.80 - 5.10 MIL/uL   Hemoglobin 12.1 11.7 - 15.5 g/dL   HCT 38.4 35.0 - 45.0 %   MCV 91.2 80.0 - 100.0 fL   MCH 28.7 27.0 - 33.0 pg   MCHC 31.5 (L) 32.0 - 36.0 g/dL   RDW 13.7 11.0 - 15.0 %   Platelets 184 140 - 400 K/uL   MPV 10.7 7.5 - 12.5 fL   Neutro Abs 7,350 1,500 - 7,800 cells/uL   Lymphs Abs 2,100 850 - 3,900 cells/uL   Monocytes Absolute 735 200 - 950 cells/uL   Eosinophils Absolute 210 15 - 500 cells/uL   Basophils Absolute 105 0 - 200 cells/uL   Neutrophils Relative % 70 %   Lymphocytes Relative 20 %   Monocytes Relative 7 %   Eosinophils Relative 2 %   Basophils Relative 1 %   Smear Review Criteria for review not met     Assessment/Plan:  Diabetes mellitus, type 2 blood glucose control important in reducing the progression of atherosclerotic disease. Also, involved in wound healing. On appropriate medications.   Malignant hypertension The patient has severe, difficult to control hypertension. She has a typical history for secondary hypertension with many years of well-controlled hypertension that has become difficult to control recently. Her renal artery duplex today did not suggest hemodynamically significant renal artery stenosis in either renal artery. We discussed that renal artery duplexes a good but not perfect study for renal artery stenosis. A small percentage of patients can still have hemodynamically significant stenosis driving elevated blood pressure despite a normal renal artery duplex. She reports that her blood pressure has been more well controlled recently. As long as her blood pressure control remains good, no further evaluation or testing will be planned. If however she develops worsening blood pressure despite the increase in her blood  pressure medication regimen, consideration for renal artery angiogram will be given. She will contact our office in the future if this develops. We have discussed the natural history and pathophysiology of renal artery stenosis and why it causes high blood pressure.      Leotis Pain 01/29/2017, 1:15 PM   This note was created with Dragon medical transcription system.  Any errors from dictation are unintentional.

## 2017-01-29 NOTE — Assessment & Plan Note (Signed)
blood glucose control important in reducing the progression of atherosclerotic disease. Also, involved in wound healing. On appropriate medications.  

## 2017-01-29 NOTE — Patient Instructions (Signed)
Renal Artery Stenosis Introduction Renal artery stenosis (RAS) is narrowing of the artery that carries blood to your kidneys. It can affect one or both kidneys. Your kidneys filter waste and extra fluid from your blood. You get rid of the waste and fluid when you urinate. Your kidneys also make an important chemical messenger (hormone) called renin. Renin helps regulate your blood pressure. The first sign of RAS may be high blood pressure. Over time, other symptoms can develop. What are the causes? Plaque buildup in your arteries (atherosclerosis) is the main cause of RAS. The plaques that cause this are made up of:  Fat.  Cholesterol.  Calcium.  Other substances. As these substances build up in your renal artery, this slows the blood supply to your kidneys. The lack of blood and oxygen causes the signs and symptoms of RAS. A much less common cause of RAS is a disease called fibromuscular dysplasia. This disease causes abnormal cell growth that narrows the renal artery. It is not related to atherosclerosis. It occurs mostly in women who are 25-50 years old. It may be passed down through families. What increases the risk? You may be at risk for renal artery stenosis if you:  Are a man who is at least 76 years old.  Are a woman who is at least 76 years old.  Have high blood pressure.  Have high cholesterol.  Are a smoker.  Abuse alcohol.  Have diabetes or prediabetes.  Are overweight.  Have a family history of early heart disease. What are the signs or symptoms? RAS usually develops slowly. You may not have any signs or symptoms at first. The earliest signs may be:  Developing high blood pressure.  A sudden increase in existing high blood pressure.  No longer responding to medicine that used to control your blood pressure. Later signs and symptoms are due to kidney damage. They may include:  Fatigue.  Shortness of breath.  Swollen legs and feet.  Dry  skin.  Headaches.  Muscle cramps.  Loss of appetite.  Nausea or vomiting. How is this diagnosed? Your health care provider may suspect RAS based on changes in your blood pressure and your risk factors. A physical exam will be done. Your health care provider may use a stethoscope to listen for a whooshing sound (bruit) that can occur where the renal artery is blocking blood flow. Several tests may be done to confirm a diagnosis of RAS. These may include:  Blood and urine tests to check your kidney function.  Imaging tests of your kidneys, such as:  A test that involves using sound waves to create an image of your kidneys and the blood flow to your kidneys (ultrasound).  A test in which dye is injected into one of your blood vessels so images can be taken as the dye flows through your renal arteries (angiogram). These tests can be done using X-rays, a CT scan (computed tomography angiogram, CTA), or a type of MRI (magnetic resonance angiogram, MRA). How is this treated? Making lifestyle changes to reduce your risk factors is the first treatment option for early RAS. If the blood flow to one of your kidneys is cut by more than half, you may need medicine to:  Lower your blood pressure. This is the main medical treatment for RAS. You may need more than one type of medicine for this. The two types that work best for RAS are:  ACE inhibitors.  Angiotensin receptor blockers.  Reduce fluid in the body (diuretics).    Lower your cholesterol (statins). If medicine is not enough to control RAS, you may need surgery. This may involve:  Threading a tube with an inflatable balloon into the renal artery to force it open (angioplasty).  Removing plaque from inside the artery (endarterectomy). Follow these instructions at home:  Take medicines only as directed by your health care provider.  Make any lifestyle changes recommended by your health care provider. This may include:  Working with a  dietitian to maintain a heart-healthy diet. This type of diet is low in saturated fat, salt, and added sugar.  Starting an exercise program as directed by your health care provider.  Maintaining a healthy weight.  Quitting smoking.  Not abusing alcohol.  Keep all follow-up visits as directed by your health care provider. This is important. Contact a health care provider if:  Your symptoms of RAS are not getting better.  Your symptoms are changing or getting worse. Get help right away if:  You have very bad pain in your back or abdomen.  You have blood in your urine. This information is not intended to replace advice given to you by your health care provider. Make sure you discuss any questions you have with your health care provider. Document Released: 09/09/2005 Document Revised: 05/21/2016 Document Reviewed: 03/29/2014  2017 Elsevier  

## 2017-02-02 ENCOUNTER — Ambulatory Visit: Payer: Commercial Managed Care - HMO | Admitting: Family Medicine

## 2017-02-05 ENCOUNTER — Ambulatory Visit: Payer: Commercial Managed Care - HMO | Admitting: Family Medicine

## 2017-02-05 ENCOUNTER — Other Ambulatory Visit: Payer: Self-pay | Admitting: Family Medicine

## 2017-02-05 DIAGNOSIS — E876 Hypokalemia: Secondary | ICD-10-CM

## 2017-02-08 DIAGNOSIS — F17200 Nicotine dependence, unspecified, uncomplicated: Secondary | ICD-10-CM | POA: Diagnosis not present

## 2017-02-08 DIAGNOSIS — N289 Disorder of kidney and ureter, unspecified: Secondary | ICD-10-CM | POA: Diagnosis not present

## 2017-02-08 DIAGNOSIS — I270 Primary pulmonary hypertension: Secondary | ICD-10-CM | POA: Diagnosis not present

## 2017-02-08 DIAGNOSIS — G4733 Obstructive sleep apnea (adult) (pediatric): Secondary | ICD-10-CM | POA: Diagnosis not present

## 2017-02-08 DIAGNOSIS — I509 Heart failure, unspecified: Secondary | ICD-10-CM | POA: Diagnosis not present

## 2017-02-08 DIAGNOSIS — I701 Atherosclerosis of renal artery: Secondary | ICD-10-CM | POA: Diagnosis not present

## 2017-02-08 DIAGNOSIS — E785 Hyperlipidemia, unspecified: Secondary | ICD-10-CM | POA: Diagnosis not present

## 2017-02-08 DIAGNOSIS — I10 Essential (primary) hypertension: Secondary | ICD-10-CM | POA: Diagnosis not present

## 2017-02-08 DIAGNOSIS — J42 Unspecified chronic bronchitis: Secondary | ICD-10-CM | POA: Diagnosis not present

## 2017-02-09 DIAGNOSIS — J449 Chronic obstructive pulmonary disease, unspecified: Secondary | ICD-10-CM | POA: Diagnosis not present

## 2017-02-09 DIAGNOSIS — J438 Other emphysema: Secondary | ICD-10-CM | POA: Diagnosis not present

## 2017-02-15 DIAGNOSIS — M5418 Radiculopathy, sacral and sacrococcygeal region: Secondary | ICD-10-CM | POA: Diagnosis not present

## 2017-02-15 DIAGNOSIS — M150 Primary generalized (osteo)arthritis: Secondary | ICD-10-CM | POA: Diagnosis not present

## 2017-02-15 DIAGNOSIS — M5430 Sciatica, unspecified side: Secondary | ICD-10-CM | POA: Diagnosis not present

## 2017-02-15 DIAGNOSIS — G894 Chronic pain syndrome: Secondary | ICD-10-CM | POA: Diagnosis not present

## 2017-02-15 DIAGNOSIS — F1120 Opioid dependence, uncomplicated: Secondary | ICD-10-CM | POA: Diagnosis not present

## 2017-02-17 DIAGNOSIS — R339 Retention of urine, unspecified: Secondary | ICD-10-CM | POA: Diagnosis not present

## 2017-02-17 DIAGNOSIS — N3020 Other chronic cystitis without hematuria: Secondary | ICD-10-CM | POA: Diagnosis not present

## 2017-02-20 DIAGNOSIS — J449 Chronic obstructive pulmonary disease, unspecified: Secondary | ICD-10-CM | POA: Diagnosis not present

## 2017-02-23 DIAGNOSIS — N183 Chronic kidney disease, stage 3 (moderate): Secondary | ICD-10-CM | POA: Diagnosis not present

## 2017-02-23 DIAGNOSIS — E1122 Type 2 diabetes mellitus with diabetic chronic kidney disease: Secondary | ICD-10-CM | POA: Diagnosis not present

## 2017-02-23 DIAGNOSIS — I129 Hypertensive chronic kidney disease with stage 1 through stage 4 chronic kidney disease, or unspecified chronic kidney disease: Secondary | ICD-10-CM | POA: Diagnosis not present

## 2017-03-02 DIAGNOSIS — E876 Hypokalemia: Secondary | ICD-10-CM | POA: Diagnosis not present

## 2017-03-02 DIAGNOSIS — E1122 Type 2 diabetes mellitus with diabetic chronic kidney disease: Secondary | ICD-10-CM | POA: Diagnosis not present

## 2017-03-02 DIAGNOSIS — N183 Chronic kidney disease, stage 3 (moderate): Secondary | ICD-10-CM | POA: Diagnosis not present

## 2017-03-02 DIAGNOSIS — I129 Hypertensive chronic kidney disease with stage 1 through stage 4 chronic kidney disease, or unspecified chronic kidney disease: Secondary | ICD-10-CM | POA: Diagnosis not present

## 2017-03-03 DIAGNOSIS — G894 Chronic pain syndrome: Secondary | ICD-10-CM | POA: Diagnosis not present

## 2017-03-03 DIAGNOSIS — F17210 Nicotine dependence, cigarettes, uncomplicated: Secondary | ICD-10-CM | POA: Diagnosis not present

## 2017-03-03 DIAGNOSIS — F1120 Opioid dependence, uncomplicated: Secondary | ICD-10-CM | POA: Diagnosis not present

## 2017-03-03 DIAGNOSIS — M150 Primary generalized (osteo)arthritis: Secondary | ICD-10-CM | POA: Diagnosis not present

## 2017-03-03 DIAGNOSIS — Z720 Tobacco use: Secondary | ICD-10-CM | POA: Diagnosis not present

## 2017-03-03 DIAGNOSIS — M5430 Sciatica, unspecified side: Secondary | ICD-10-CM | POA: Diagnosis not present

## 2017-03-03 DIAGNOSIS — Z79899 Other long term (current) drug therapy: Secondary | ICD-10-CM | POA: Diagnosis not present

## 2017-03-05 DIAGNOSIS — M4316 Spondylolisthesis, lumbar region: Secondary | ICD-10-CM | POA: Diagnosis not present

## 2017-03-08 DIAGNOSIS — G4733 Obstructive sleep apnea (adult) (pediatric): Secondary | ICD-10-CM | POA: Diagnosis not present

## 2017-03-09 DIAGNOSIS — J438 Other emphysema: Secondary | ICD-10-CM | POA: Diagnosis not present

## 2017-03-09 DIAGNOSIS — J449 Chronic obstructive pulmonary disease, unspecified: Secondary | ICD-10-CM | POA: Diagnosis not present

## 2017-03-18 ENCOUNTER — Encounter (INDEPENDENT_AMBULATORY_CARE_PROVIDER_SITE_OTHER): Payer: Self-pay

## 2017-03-18 ENCOUNTER — Encounter (INDEPENDENT_AMBULATORY_CARE_PROVIDER_SITE_OTHER): Payer: Self-pay | Admitting: Vascular Surgery

## 2017-03-20 DIAGNOSIS — J449 Chronic obstructive pulmonary disease, unspecified: Secondary | ICD-10-CM | POA: Diagnosis not present

## 2017-03-24 ENCOUNTER — Other Ambulatory Visit: Payer: Self-pay | Admitting: Family Medicine

## 2017-04-06 DIAGNOSIS — M5136 Other intervertebral disc degeneration, lumbar region: Secondary | ICD-10-CM | POA: Diagnosis not present

## 2017-04-06 DIAGNOSIS — M5416 Radiculopathy, lumbar region: Secondary | ICD-10-CM | POA: Diagnosis not present

## 2017-04-09 DIAGNOSIS — J449 Chronic obstructive pulmonary disease, unspecified: Secondary | ICD-10-CM | POA: Diagnosis not present

## 2017-04-09 DIAGNOSIS — J438 Other emphysema: Secondary | ICD-10-CM | POA: Diagnosis not present

## 2017-04-12 DIAGNOSIS — Z7151 Drug abuse counseling and surveillance of drug abuser: Secondary | ICD-10-CM | POA: Diagnosis not present

## 2017-04-12 DIAGNOSIS — F1120 Opioid dependence, uncomplicated: Secondary | ICD-10-CM | POA: Diagnosis not present

## 2017-04-12 DIAGNOSIS — Z79891 Long term (current) use of opiate analgesic: Secondary | ICD-10-CM | POA: Diagnosis not present

## 2017-04-12 DIAGNOSIS — F17210 Nicotine dependence, cigarettes, uncomplicated: Secondary | ICD-10-CM | POA: Diagnosis not present

## 2017-04-12 DIAGNOSIS — G894 Chronic pain syndrome: Secondary | ICD-10-CM | POA: Diagnosis not present

## 2017-04-12 DIAGNOSIS — M5430 Sciatica, unspecified side: Secondary | ICD-10-CM | POA: Diagnosis not present

## 2017-04-12 DIAGNOSIS — Z720 Tobacco use: Secondary | ICD-10-CM | POA: Diagnosis not present

## 2017-04-12 DIAGNOSIS — Z729 Problem related to lifestyle, unspecified: Secondary | ICD-10-CM | POA: Diagnosis not present

## 2017-04-12 DIAGNOSIS — M5418 Radiculopathy, sacral and sacrococcygeal region: Secondary | ICD-10-CM | POA: Diagnosis not present

## 2017-04-12 DIAGNOSIS — M150 Primary generalized (osteo)arthritis: Secondary | ICD-10-CM | POA: Diagnosis not present

## 2017-04-15 DIAGNOSIS — E1122 Type 2 diabetes mellitus with diabetic chronic kidney disease: Secondary | ICD-10-CM | POA: Diagnosis not present

## 2017-04-15 DIAGNOSIS — N183 Chronic kidney disease, stage 3 (moderate): Secondary | ICD-10-CM | POA: Diagnosis not present

## 2017-04-15 DIAGNOSIS — N390 Urinary tract infection, site not specified: Secondary | ICD-10-CM | POA: Diagnosis not present

## 2017-04-15 DIAGNOSIS — I129 Hypertensive chronic kidney disease with stage 1 through stage 4 chronic kidney disease, or unspecified chronic kidney disease: Secondary | ICD-10-CM | POA: Diagnosis not present

## 2017-04-20 ENCOUNTER — Ambulatory Visit: Payer: Commercial Managed Care - HMO | Admitting: Family Medicine

## 2017-04-20 DIAGNOSIS — J449 Chronic obstructive pulmonary disease, unspecified: Secondary | ICD-10-CM | POA: Diagnosis not present

## 2017-05-04 ENCOUNTER — Telehealth: Payer: Self-pay | Admitting: Family Medicine

## 2017-05-04 ENCOUNTER — Other Ambulatory Visit: Payer: Self-pay

## 2017-05-04 DIAGNOSIS — J432 Centrilobular emphysema: Secondary | ICD-10-CM

## 2017-05-04 DIAGNOSIS — G4733 Obstructive sleep apnea (adult) (pediatric): Secondary | ICD-10-CM | POA: Diagnosis not present

## 2017-05-04 MED ORDER — FLUTICASONE-SALMETEROL 250-50 MCG/DOSE IN AEPB: 1.0000 | INHALATION_SPRAY | Freq: Two times a day (BID) | RESPIRATORY_TRACT | 12 refills | Status: AC

## 2017-05-04 NOTE — Telephone Encounter (Signed)
Pt needs a refill on advair sent to [facility].  Her call back number is [phone].

## 2017-05-04 NOTE — Telephone Encounter (Signed)
Pharmacy requesting refill Last ov  01/18/17 Last filled 04/19/16

## 2017-05-04 NOTE — Telephone Encounter (Signed)
Refilled Advair 1 puff BID, +12 refills to [facility] [place] [facility] 05/04/2017, 3:54 PM

## 2017-05-09 DIAGNOSIS — J438 Other emphysema: Secondary | ICD-10-CM | POA: Diagnosis not present

## 2017-05-09 DIAGNOSIS — J449 Chronic obstructive pulmonary disease, unspecified: Secondary | ICD-10-CM | POA: Diagnosis not present

## 2017-05-11 ENCOUNTER — Encounter: Payer: Self-pay | Admitting: Emergency Medicine

## 2017-05-11 ENCOUNTER — Emergency Department
Admission: EM | Admit: 2017-05-11 | Discharge: 2017-05-11 | Disposition: A | Discharge status: Home / Self Care | Payer: Medicare HMO | Attending: Emergency Medicine | Admitting: Emergency Medicine

## 2017-05-11 ENCOUNTER — Emergency Department: Payer: Medicare HMO

## 2017-05-11 ENCOUNTER — Other Ambulatory Visit: Payer: Self-pay

## 2017-05-11 DIAGNOSIS — J441 Chronic obstructive pulmonary disease with (acute) exacerbation: Secondary | ICD-10-CM | POA: Diagnosis not present

## 2017-05-11 DIAGNOSIS — Z7982 Long term (current) use of aspirin: Secondary | ICD-10-CM | POA: Diagnosis not present

## 2017-05-11 DIAGNOSIS — E1122 Type 2 diabetes mellitus with diabetic chronic kidney disease: Secondary | ICD-10-CM | POA: Insufficient documentation

## 2017-05-11 DIAGNOSIS — Z7984 Long term (current) use of oral hypoglycemic drugs: Secondary | ICD-10-CM | POA: Insufficient documentation

## 2017-05-11 DIAGNOSIS — I129 Hypertensive chronic kidney disease with stage 1 through stage 4 chronic kidney disease, or unspecified chronic kidney disease: Secondary | ICD-10-CM | POA: Insufficient documentation

## 2017-05-11 DIAGNOSIS — Z87891 Personal history of nicotine dependence: Secondary | ICD-10-CM | POA: Diagnosis not present

## 2017-05-11 DIAGNOSIS — N3000 Acute cystitis without hematuria: Secondary | ICD-10-CM | POA: Insufficient documentation

## 2017-05-11 DIAGNOSIS — N189 Chronic kidney disease, unspecified: Secondary | ICD-10-CM | POA: Insufficient documentation

## 2017-05-11 DIAGNOSIS — Z79899 Other long term (current) drug therapy: Secondary | ICD-10-CM | POA: Insufficient documentation

## 2017-05-11 DIAGNOSIS — R0602 Shortness of breath: Secondary | ICD-10-CM | POA: Diagnosis not present

## 2017-05-11 LAB — CBC WITH DIFFERENTIAL/PLATELET
Basophils Absolute: 0.1 10*3/uL (ref 0–0.1)
Basophils Relative: 1 %
EOS ABS: 0 10*3/uL (ref 0–0.7)
EOS PCT: 0 %
HCT: 36.8 % (ref 35.0–47.0)
HEMOGLOBIN: 12 g/dL (ref 12.0–16.0)
Lymphocytes Relative: 17 %
Lymphs Abs: 2 10*3/uL (ref 1.0–3.6)
MCH: 28.5 pg (ref 26.0–34.0)
MCHC: 32.6 g/dL (ref 32.0–36.0)
MCV: 87.3 fL (ref 80.0–100.0)
Monocytes Absolute: 0.8 10*3/uL (ref 0.2–0.9)
Monocytes Relative: 7 %
NEUTROS PCT: 75 %
Neutro Abs: 8.8 10*3/uL — ABNORMAL HIGH (ref 1.4–6.5)
PLATELETS: 197 10*3/uL (ref 150–440)
RBC: 4.21 MIL/uL (ref 3.80–5.20)
RDW: 14 % (ref 11.5–14.5)
WBC: 11.6 10*3/uL — ABNORMAL HIGH (ref 3.6–11.0)

## 2017-05-11 LAB — URINALYSIS, COMPLETE (UACMP) WITH MICROSCOPIC
BILIRUBIN URINE: NEGATIVE
GLUCOSE, UA: NEGATIVE mg/dL
HGB URINE DIPSTICK: NEGATIVE
KETONES UR: NEGATIVE mg/dL
NITRITE: POSITIVE — AB
PROTEIN: NEGATIVE mg/dL
Specific Gravity, Urine: 1.019 (ref 1.005–1.030)
pH: 6 (ref 5.0–8.0)

## 2017-05-11 LAB — COMPREHENSIVE METABOLIC PANEL
ALBUMIN: 3.7 g/dL (ref 3.5–5.0)
ALK PHOS: 57 U/L (ref 38–126)
ALT: 16 U/L (ref 14–54)
ANION GAP: 9 (ref 5–15)
AST: 25 U/L (ref 15–41)
BUN: 23 mg/dL — ABNORMAL HIGH (ref 6–20)
CALCIUM: 9.1 mg/dL (ref 8.9–10.3)
CO2: 24 mmol/L (ref 22–32)
CREATININE: 1.15 mg/dL — AB (ref 0.44–1.00)
Chloride: 103 mmol/L (ref 101–111)
GFR calc Af Amer: 53 mL/min — ABNORMAL LOW (ref >60)
GFR calc non Af Amer: 45 mL/min — ABNORMAL LOW (ref >60)
GLUCOSE: 245 mg/dL — AB (ref 65–99)
Potassium: 4.1 mmol/L (ref 3.5–5.1)
SODIUM: 136 mmol/L (ref 135–145)
Total Bilirubin: 0.6 mg/dL (ref 0.3–1.2)
Total Protein: 6.9 g/dL (ref 6.5–8.1)

## 2017-05-11 LAB — TROPONIN I: Troponin I: 0.03 ng/mL (ref <0.03)

## 2017-05-11 MED ORDER — CEPHALEXIN 500 MG PO CAPS: 500.0000 mg | ORAL_CAPSULE | Freq: Two times a day (BID) | ORAL | 0 refills | Status: AC

## 2017-05-11 MED ORDER — CEPHALEXIN 500 MG PO CAPS
500.0000 mg | ORAL_CAPSULE | Freq: Once | ORAL | Status: AC
  Administered 2017-05-11: 500 mg via ORAL
  Filled 2017-05-11: qty 1

## 2017-05-11 MED ORDER — ALBUTEROL SULFATE (2.5 MG/3ML) 0.083% IN NEBU
5.0000 mg | INHALATION_SOLUTION | Freq: Once | RESPIRATORY_TRACT | Status: DC
  Filled 2017-05-11: qty 6

## 2017-05-11 MED ORDER — IPRATROPIUM-ALBUTEROL 0.5-2.5 (3) MG/3ML IN SOLN
3.0000 mL | Freq: Once | RESPIRATORY_TRACT | Status: AC
  Administered 2017-05-11: 3 mL via RESPIRATORY_TRACT
  Filled 2017-05-11: qty 3

## 2017-05-11 NOTE — ED Provider Notes (Signed)
[facility] Emergency Department Provider Note    First MD Initiated Contact with Patient 05/11/17 1412     (approximate)  I have reviewed the triage vital signs and the nursing notes.   HISTORY  Chief Complaint Shortness of Breath   HPI Megan Bolton is a 76 y.o. female with history COPD, hyperlipidemia, frequent UTIs diabetes and hypertension presents to the emergency department with urinary frequency and urgency 1 day. Patient also admits to chills. Patient also admits to dyspnea today, patient denies any chest pain, no worsening cough.   Past Medical History:  Diagnosis Date  . COPD (chronic obstructive pulmonary disease) ([place])   . Diabetes mellitus without complication ([place])   . Hyperlipidemia   . Hypertension     Patient Active Problem List   Diagnosis Date Noted  . Malignant hypertension 01/29/2017  . Chronic respiratory failure with hypoxia, on home O2 therapy ([place]) 12/24/2016  . Incomplete emptying of bladder 07/19/2016  . Bacteriuria 07/17/2016  . Infection due to ESBL-producing Escherichia coli 04/21/2016  . Frequent UTI 04/21/2016  . Sepsis ([place]) 04/18/2016  . UTI (lower urinary tract infection) 04/18/2016  . Type 2 diabetes mellitus ([place]) 04/18/2016  . OSA on CPAP 04/18/2016  . Bladder infection, chronic 04/17/2016  . Calculus of kidney 04/17/2016  . Community acquired pneumonia 02/28/2016  . Pneumonia 02/28/2016  . [name] (chronic kidney disease) 07/12/2015  . Diabetes mellitus, type 2 ([place]) 06/11/2015  . Arthritis, degenerative 06/11/2015  . Hypercholesteremia 06/11/2015  . 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 Injury of kidney 04/14/2015  . Essential hypertension 04/14/2015  . Blood glucose elevated 04/14/2015  . Apnea, sleep 04/14/2015  . COPD (chronic obstructive pulmonary disease) ([place]) 04/14/2015  . DDD (degenerative disc disease), lumbar 08/01/2014  . Lumbar canal stenosis 05/11/2014  .  Neuritis or radiculitis due to rupture of lumbar intervertebral disc 05/11/2014  . 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 Lumbosacral spondylosis 05/11/2014  . Thoracic and lumbosacral neuritis 05/11/2014  . Accelerating angina ([place]) 03/13/2014    Past Surgical History:  Procedure Laterality Date  . ABDOMINAL HYSTERECTOMY    . SPINE SURGERY      Prior to Admission medications   Medication Sig Start Date End Date Taking? Authorizing Provider  albuterol (PROVENTIL) (2.5 MG/3ML) 0.083% nebulizer solution Take 3 mLs (2.5 mg total) by nebulization every 6 (six) hours as needed for wheezing. 12/01/16  Yes Arlis Porta., MD  albuterol (VENTOLIN HFA) 108 (90 Base) MCG/ACT inhaler Inhale 2 puffs into the lungs every 6 (six) hours as needed for wheezing or shortness of breath. 01/30/16  Yes Arlis Porta., MD  aspirin EC 325 MG tablet Take 325 mg by mouth daily.   Yes [provider]  carvedilol (COREG) 25 MG tablet Take 1 tablet (25 mg total) by mouth 2 (two) times daily. 12/01/16  Yes Arlis Porta., MD  clonazePAM (KLONOPIN) 0.5 MG tablet 0.5 mg at bedtime.  07/04/15  Yes [provider]  cloNIDine (CATAPRES) 0.1 MG tablet Take 1 tab up to 3 times a day for any BPs >170.  Hold dose if BP lower than 170. 01/15/17  Yes Arlis Porta., MD  Fluticasone-Salmeterol (ADVAIR DISKUS) 250-50 MCG/DOSE AEPB Inhale 1 puff into the lungs 2 (two) times daily. 05/04/17  Yes Karamalegos, Devonne Doughty, DO  gabapentin (NEURONTIN) 800 MG tablet Take 1 tablet (800 mg total) by mouth 3 (three) times daily. 12/01/16  Yes Luan Pulling,  Ronelle Nigh., MD  hydrALAZINE (APRESOLINE) 25 MG tablet Take 25 mg by mouth 3 (three) times daily. 04/23/17  Yes [provider]  losartan-hydrochlorothiazide (HYZAAR) 100-25 MG tablet Take 1 tablet by mouth daily. 05/03/17  Yes [provider]  metFORMIN (GLUCOPHAGE) 500 MG tablet TAKE ONE TABLET BY MOUTH EVERY MORNING WITH BREAKFAST  03/24/17  Yes Arlis Porta., MD  montelukast (SINGULAIR) 10 MG tablet TAKE ONE TABLET BY MOUTH AT BEDTIME 03/24/17  Yes Arlis Porta., MD  OXYCONTIN 10 MG 12 hr tablet Take 10 mg by mouth 2 (two) times daily.   Yes [provider]  potassium chloride (K-DUR) 10 MEQ tablet TAKE ONE TABLET BY MOUTH TWICE DAILY 02/05/17  Yes Arlis Porta., MD  SPIRIVA HANDIHALER 18 MCG inhalation capsule Place 1 capsule (18 mcg total) into inhaler and inhale daily. 12/29/16  Yes Arlis Porta., MD  valACYclovir (VALTREX) 1000 MG tablet Take 1 tablet (1,000 mg total) by mouth daily as needed (for fever blisters). 04/19/16  Yes Gladstone Lighter, MD  ACCU-CHEK AVIVA PLUS test strip 1 strip by Other route 2 (two) times daily. 07/15/16   [provider]  ACCU-CHEK SOFTCLIX LANCETS lancets 1 each by Other route 2 (two) times daily. 06/17/16   [provider]  Blood Glucose Monitoring Suppl (ACCU-CHEK AVIVA PLUS) w/Device KIT USE AS DIRECTED 01/12/17   Arlis Porta., MD  furosemide (LASIX) 20 MG tablet TAKE ONE TABLET BY MOUTH EVERY DAY Patient not taking: Reported on 05/11/2017 12/09/16   Arlis Porta., MD  lisinopril (PRINIVIL,ZESTRIL) 20 MG tablet Take 1 tablet (20 mg total) by mouth daily. Patient not taking: Reported on 05/11/2017 08/25/16   Arlis Porta., MD  mupirocin ointment [facility]) 2 % One application as directed three times a day. Patient not taking: Reported on 05/11/2017 12/01/16   Arlis Porta., MD  OXYGEN Inhale 2 L into the lungs continuous.    [provider]    Allergies Nitrofurantoin  Family History  Problem Relation Age of Onset  . Hypertension Mother   . Cancer Father   . Hypertension Paternal Grandfather     Social History Social History  Substance Use Topics  . Smoking status: Former Smoker    Packs/day: 0.50    Quit date: 04/28/2015  . Smokeless tobacco: Never Used  . Alcohol use No    Review of  Systems Constitutional: No fever/chills Eyes: No visual changes. ENT: No sore throat. Cardiovascular: Denies chest pain. Respiratory: Positive for shortness of breath. Gastrointestinal: No abdominal pain.  No nausea, no vomiting.  No diarrhea.  No constipation. Genitourinary: Positive for urinary frequency or urgency Musculoskeletal: Negative for back pain. Integumentary: Negative for rash. Neurological: Negative for headaches, focal weakness or numbness.   ____________________________________________   PHYSICAL EXAM:  VITAL SIGNS: ED Triage Vitals  Enc Vitals Group     BP 05/11/17 1243 129/69     Pulse Rate 05/11/17 1243 67     Resp 05/11/17 1243 16     Temp 05/11/17 1243 98.4 F (36.9 C)     Temp Source 05/11/17 1243 Oral     SpO2 05/11/17 1243 94 %     Weight 05/11/17 1243 220 lb (99.8 kg)     Height 05/11/17 1243 _0  (1.676 m)     Head Circumference --      Peak Flow --      Pain Score 05/11/17 1301 0  Pain Loc --      Pain Edu? --      Excl. in [place]? --     Constitutional: Alert and oriented. Well appearing and in no acute distress. Eyes: Conjunctivae are normal. PERRL. EOMI. Head: Atraumatic. Mouth/Throat: Mucous membranes are moist. Oropharynx non-erythematous. Neck: No stridor.  Cardiovascular: Normal rate, regular rhythm. Good peripheral circulation. Grossly normal heart sounds. Respiratory: Normal respiratory effort.  No retractions. Bibasilar rhonchi. Gastrointestinal: Soft and nontender. No distention.  Musculoskeletal: No lower extremity tenderness nor edema. No gross deformities of extremities. Neurologic:  Normal speech and language. No gross focal neurologic deficits are appreciated.  Skin:  Skin is warm, dry and intact. No rash noted. Psychiatric: Mood and affect are normal. Speech and behavior are normal.  ____________________________________________   LABS (all labs ordered are listed, but only abnormal results are displayed)  Labs  Reviewed  CBC WITH DIFFERENTIAL/PLATELET - Abnormal; Notable for the following:       Result Value   WBC 11.6 (*)    Neutro Abs 8.8 (*)    All other components within normal limits  COMPREHENSIVE METABOLIC PANEL - Abnormal; Notable for the following:    Glucose, Bld 245 (*)    BUN 23 (*)    Creatinine, Ser 1.15 (*)    GFR calc non Af Amer 45 (*)    GFR calc Af Amer 53 (*)    All other components within normal limits  TROPONIN I - Abnormal; Notable for the following:    Troponin I 0.03 (*)    All other components within normal limits  URINALYSIS, COMPLETE (UACMP) WITH MICROSCOPIC - Abnormal; Notable for the following:    Color, Urine YELLOW (*)    APPearance HAZY (*)    Nitrite POSITIVE (*)    Leukocytes, UA TRACE (*)    Bacteria, UA RARE (*)    Squamous Epithelial / LPF 0-5 (*)    All other components within normal limits  URINE CULTURE   ____________________________________________  EKG  ED ECG REPORT I, [place] N BROWN, the attending physician, personally viewed and interpreted this ECG.   Date: 05/11/2017  EKG Time: 1:09 PM  Rate: 68  Rhythm: Normal sinus rhythm  Axis: Normal  Intervals: Normal  ST&T Change:   ____________________________________________  RADIOLOGY I, [place] Ernst Bowler, personally viewed and evaluated these images (plain radiographs) as part of my medical decision making, as well as reviewing the written report by the radiologist.  Dg Chest 2 View  Result Date: 05/11/2017 CLINICAL DATA:  Shortness of breath EXAM: CHEST  2 VIEW COMPARISON:  12/24/2016 FINDINGS: Cardiomegaly and aortic tortuosity, stable. Chronic interstitial coarsening with mild scarring or atelectasis at the bases. Chronic hyperinflation. Trace pleural effusions seen previously has resolved, as has cephalized blood flow. There is no edema, consolidation, effusion, or pneumothorax. IMPRESSION: COPD and chronic cardiomegaly.  No acute superimposed finding. Electronically Signed   By:  Monte Fantasia M.D.   On: 05/11/2017 13:36    ___ Procedures   ____________________________________________   INITIAL IMPRESSION / ASSESSMENT AND PLAN / ED COURSE  Pertinent labs & imaging results that were available during my care of the patient were reviewed by me and considered in my medical decision making (see chart for details).  76 year old female presenting with urinary urgency frequency and chills. Urinalysis consistent with UTI. Given multiple urinary tract infections in the past with antibiotic resistance patient given Keflex today as last urine culture from 2017 revealed sensitivity to cephalosporins. However urine culture was obtained  today as well. Patient is advised to return to emergency department if fever or flank pain. Regarding the patient's dyspnea patient satting 97% on baseline 2 L nasal cannula of oxygen. Patient has no chest pain EKG revealed no evidence of ischemia or infarction. Troponin negative times one. Patient given a DuoNeb in the emergency department with resolution of dyspnea. Given history and physical exam suspect COPD exacerbation as etiology for the patient's dyspnea.      ____________________________________________  FINAL CLINICAL IMPRESSION(S) / ED DIAGNOSES  Final diagnoses:  Acute cystitis without hematuria  COPD exacerbation (HCC)     MEDICATIONS GIVEN DURING THIS VISIT:  Medications  albuterol (PROVENTIL) (2.5 MG/3ML) 0.083% nebulizer solution 5 mg (not administered)  cephALEXin (KEFLEX) capsule 500 mg (not administered)  ipratropium-albuterol (DUONEB) 0.5-2.5 (3) MG/3ML nebulizer solution 3 mL (3 mLs Nebulization Given 05/11/17 1440)     NEW OUTPATIENT MEDICATIONS STARTED DURING THIS VISIT:  New Prescriptions   No medications on file    Modified Medications   No medications on file    Discontinued Medications   TIZANIDINE (ZANAFLEX) 4 MG TABLET    Take 4 mg by mouth daily.     Note:  This document was prepared using  Dragon voice recognition software and may include unintentional dictation errors.    Gregor Hams, MD 05/11/17 [phone]

## 2017-05-11 NOTE — ED Triage Notes (Signed)
C/O SOB and chills x 1 day.  Denies cough.  Denies fever.  SOB worse with exertion.  Wears 2L/[place] .

## 2017-05-11 NOTE — ED Notes (Signed)
Pt taken to car via wheelchair and discharged at 1700. Due to emergency pt was unable to be removed from chart until this time.

## 2017-05-12 ENCOUNTER — Other Ambulatory Visit: Payer: Self-pay

## 2017-05-12 DIAGNOSIS — J45901 Unspecified asthma with (acute) exacerbation: Secondary | ICD-10-CM

## 2017-05-12 MED ORDER — ALBUTEROL SULFATE HFA 108 (90 BASE) MCG/ACT IN AERS: 2.0000 | INHALATION_SPRAY | Freq: Four times a day (QID) | RESPIRATORY_TRACT | 12 refills | Status: AC | PRN

## 2017-05-13 LAB — URINE CULTURE

## 2017-05-14 NOTE — Progress Notes (Addendum)
ED Antimicrobial Stewardship Positive Culture Follow Up   Megan Bolton is an 76 y.o. female who presented to [facility] on 05/11/2017 with a chief complaint of  Chief Complaint  Patient presents with  . Shortness of Breath    Recent Results (from the past 720 hour(s))  Urine culture     Status: Abnormal   Collection Time: 05/11/17  2:30 PM  Result Value Ref Range Status   Specimen Description URINE, RANDOM  Final   Special Requests NONE  Final   Culture (A)  Final    >=100,000 COLONIES/mL ESCHERICHIA COLI Confirmed Extended Spectrum Beta-Lactamase Producer (ESBL) Performed at [facility], [address], [place], [place] [address]    Report Status 05/13/2017 FINAL  Final   Organism ID, Bacteria ESCHERICHIA COLI (A)  Final      Susceptibility   Escherichia coli - MIC*    AMPICILLIN >=32 RESISTANT Resistant     CEFAZOLIN >=64 RESISTANT Resistant     CEFTRIAXONE RESISTANT Resistant     CIPROFLOXACIN >=4 RESISTANT Resistant     GENTAMICIN <=1 SENSITIVE Sensitive     IMIPENEM <=0.25 SENSITIVE Sensitive     NITROFURANTOIN <=16 SENSITIVE Sensitive     TRIMETH/SULFA >=320 RESISTANT Resistant     AMPICILLIN/SULBACTAM 4 SENSITIVE Sensitive     PIP/TAZO <=4 SENSITIVE Sensitive     Extended ESBL POSITIVE Resistant     * >=100,000 COLONIES/mL ESCHERICHIA COLI    [x]  Treated with cephalexin, organism resistant to prescribed antimicrobial  Spoke with patient about new prescription for fosfomycin. Informed patient to stop taking cephalexin and to pick up new prescription at the pharmacy.   New antibiotic prescription: fosfomycin 3g PO every 2days for 3 doses  Called multiple pharmacies within the [place] and [place] area. Finally called in a prescription to a [facility] on [address] in [place].   ED Provider: Celedonio Miyamoto, PharmD 05/14/2017, 7:51 PM

## 2017-05-18 ENCOUNTER — Encounter: Payer: Self-pay | Admitting: Nurse Practitioner

## 2017-05-18 ENCOUNTER — Ambulatory Visit (INDEPENDENT_AMBULATORY_CARE_PROVIDER_SITE_OTHER): Payer: Commercial Managed Care - HMO | Admitting: Nurse Practitioner

## 2017-05-18 VITALS — BP 133/52 | HR 70 | Ht 66.0 in | Wt 221.0 lb

## 2017-05-18 DIAGNOSIS — G629 Polyneuropathy, unspecified: Secondary | ICD-10-CM | POA: Diagnosis not present

## 2017-05-18 DIAGNOSIS — I10 Essential (primary) hypertension: Secondary | ICD-10-CM | POA: Diagnosis not present

## 2017-05-18 DIAGNOSIS — E1169 Type 2 diabetes mellitus with other specified complication: Secondary | ICD-10-CM | POA: Diagnosis not present

## 2017-05-18 LAB — POCT GLYCOSYLATED HEMOGLOBIN (HGB A1C): Hemoglobin A1C: 6.6

## 2017-05-18 MED ORDER — AMITRIPTYLINE HCL 10 MG PO TABS: 10.0000 mg | ORAL_TABLET | Freq: Every evening | ORAL | 2 refills | Status: DC | PRN

## 2017-05-18 NOTE — Patient Instructions (Addendum)
Megan Bolton, Thank you for coming in to clinic today.  1. Your numbness is most likely related to your nerves to your feet.  Your sensation is decreased and this can be caused by several things.  You have some nerve pain from your back injury.  This can make your feet pain worse.  It could also be caused from having diabetes. - I am going to start you on a low dose of amitriptyline.  If this medication helps, we can continue it and go up slowly if you need [place] to.  Be careful with this medication because it can make you sleepy and can worsen your balance.  If it helps, we can continue it.  If it does not help, you can stop this medication. Let me know if you have any difficulty with this medication. - We can always continue from this point with a neurology referral to evaluate your nerves to your feet.  Please schedule a follow-up appointment with Cassell Smiles, AGNP to Return 7 days for worsening symptoms or persistent symptoms.  If you have any other questions or concerns, please feel free to call the clinic or send a message through [place]. You may also schedule an earlier appointment if necessary.  Cassell Smiles, DNP, AGNP-BC Adult Gerontology Nurse Practitioner [facility], [facility]    Neuropathic Pain Neuropathic pain is pain caused by damage to the nerves that are responsible for certain sensations in your body (sensory nerves). The pain can be caused by damage to:  The sensory nerves that send signals to your spinal cord and brain (peripheral nervous system).  The sensory nerves in your brain or spinal cord (central nervous system). Neuropathic pain can make you more sensitive to pain. What would be a minor sensation for most people may feel very painful if you have neuropathic pain. This is usually a long-term condition that can be difficult to treat. The type of pain can differ from person to person. It may start suddenly (acute), or it may develop slowly and last for a  long time (chronic). Neuropathic pain may come and go as damaged nerves heal or may stay at the same level for years. It often causes emotional distress, loss of sleep, and a lower quality of life. What are the causes? The most common cause of damage to a sensory nerve is diabetes. Many other diseases and conditions can also cause neuropathic pain. Causes of neuropathic pain can be classified as:  Toxic. Many drugs and chemicals can cause toxic damage. The most common cause of toxic neuropathic pain is damage from drug treatment for cancer (chemotherapy).  Metabolic. This type of pain can happen when a disease causes imbalances that damage nerves. Diabetes is the most common of these diseases. Vitamin B deficiency caused by long-term alcohol abuse is another common cause.  Traumatic. Any injury that cuts, crushes, or stretches a nerve can cause damage and pain. A common example is feeling pain after losing an arm or leg (phantom limb pain).  Compression-related. If a sensory nerve gets trapped or compressed for a long period of time, the blood supply to the nerve can be cut off.  Vascular. Many blood vessel diseases can cause neuropathic pain by decreasing blood supply and oxygen to nerves.  Autoimmune. This type of pain results from diseases in which the body's defense system mistakenly attacks sensory nerves. Examples of autoimmune diseases that can cause neuropathic pain include lupus and multiple sclerosis.  Infectious. Many types of viral infections can  damage sensory nerves and cause pain. Shingles infection is a common cause of this type of pain.  Inherited. Neuropathic pain can be a symptom of many diseases that are passed down through families (genetic). What are the signs or symptoms? The main symptom is pain. Neuropathic pain is often described as:  Burning.  Shock-like.  Stinging.  Hot or cold.  Itching. How is this diagnosed? No single test can diagnose neuropathic pain.  Your health care provider will do a physical exam and ask you about your pain. You may use a pain scale to describe how bad your pain is. You may also have tests to see if you have a high sensitivity to pain and to help find the cause and location of any sensory nerve damage. These tests may include:  Imaging studies, such as:  X-rays.  CT scan.  MRI.  Nerve conduction studies to test how well nerve signals travel through your sensory nerves (electrodiagnostic testing).  Stimulating your sensory nerves through electrodes on your skin and measuring the response in your spinal cord and brain (somatosensory evoked potentials). How is this treated? Treatment for neuropathic pain may change over time. You may need to try different treatment options or a combination of treatments. Some options include:  Over-the-counter pain relievers.  Prescription medicines. Some medicines used to treat other conditions may also help neuropathic pain. These include medicines to:  Control seizures (anticonvulsants).  Relieve depression (antidepressants).  Prescription-strength pain relievers (narcotics). These are usually used when other pain relievers do not help.  Transcutaneous nerve stimulation (TENS). This uses electrical currents to block painful nerve signals. The treatment is painless.  Topical and local anesthetics. These are medicines that numb the nerves. They can be injected as a nerve block or applied to the skin.  Alternative treatments, such as:  Acupuncture.  Meditation.  Massage.  Physical therapy.  Pain management programs.  Counseling. Follow these instructions at home:  Learn as much as you can about your condition.  Take medicines only as directed by your health care provider.  Work closely with all your health care providers to find what works best for you.  Have a good support system at home.  Consider joining a chronic pain support group. Contact a health care  provider if:  Your pain treatments are not helping.  You are having side effects from your medicines.  You are struggling with fatigue, mood changes, depression, or anxiety. This information is not intended to replace advice given to you by your health care provider. Make sure you discuss any questions you have with your health care provider. Document Released: 09/10/2004 Document Revised: 07/03/2016 Document Reviewed: 05/24/2014 Elsevier Interactive Patient Education  2017 [employer].

## 2017-05-18 NOTE — Progress Notes (Signed)
Subjective:    Patient ID: Megan Bolton, female    DOB: 1941/05/16, 76 y.o.   MRN: [id number]  Megan Bolton is a 76 y.o. female presenting on 05/18/2017 for Numbness (numbness feet & ankle  x 2-55mths )   HPI  Cramping pain and numbness in feet "Drawing numbness" - still taking gabapentin 800 mg tid.  Not helping with numbness and tingling.  Then numbness and the sensation of her toes pulling back toward her heel in the same dimensional plane like an accordion and having sensation of cramps.  Occurs over the last 2-3 months or longer.  Hard to walk because of the numbness and cramping pain.  Joints in ankles are also sore.  Has tried Lyrica and remembers "it didn't agree with me."  She sees pain clinic regularly for lower back pain and nerve-related pain.  Hypertension BP has been irregular.  [facility] - Dr. Juleen China has been managing her BP medications.  Pt does not have a list or her current pill bottles with her at this visit which makes med reconciliation and med management difficult. Pt has checked her BP at home and her lowest BP readings in the last week have been: 140s-150s SBP over 70s DBP.  Diabetes Metformin 500 mg once daily without GI side effects. She states previously on 500 mg bid, but Dr. Luan Pulling lowered this at her last visit.   Pt denies polydipsia, polyphagia, polyuria, headaches, diaphoresis, shakiness, chills, pain, numbness or tingling in extremities, and changes in vision.  She is having some nocturia, but has also had recent UTI with treatment at emergency room.  She has had her antibiotic changed to something that "starts with fos-", but patient doesn't remember what it is or have her medication with her.     Social History  Substance Use Topics  . Smoking status: Former Smoker    Packs/day: 0.50    Quit date: 04/28/2015  . Smokeless tobacco: Never Used  . Alcohol use No    Review of Systems Per HPI unless specifically indicated above       Objective:    BP (!) 133/52   Pulse 70   Ht 5\' 6"  (1.676 m)   Wt 221 lb (100.2 kg)   BMI 35.67 kg/m     Wt Readings from Last 3 Encounters:  05/18/17 221 lb (100.2 kg)  05/11/17 220 lb (99.8 kg)  01/29/17 221 lb (100.2 kg)    Physical Exam  Constitutional: She is oriented to person, place, and time. She appears well-developed and well-nourished. No distress.  HENT:  Head: Normocephalic and atraumatic.  Left Ear: External ear normal.  Ears:  Neck: Normal range of motion. Neck supple. No JVD present. No tracheal deviation present.  Cardiovascular: Normal rate, regular rhythm and intact distal pulses.   Occasional extrasystoles are present. Exam reveals gallop and S3.   Pulmonary/Chest: Effort normal and breath sounds normal. No respiratory distress.  Musculoskeletal:  Antalgic gait   Lymphadenopathy:    She has no cervical adenopathy.  Neurological: She is alert and oriented to person, place, and time.  Skin: Skin is warm and dry.  Psychiatric: She has a normal mood and affect. Her behavior is normal. Judgment and thought content normal.  Vitals reviewed.   Diabetic Foot Form - Detailed   Diabetic Foot Exam - detailed Diabetic Foot exam was performed with the following findings:  Yes 05/18/2017  1:30 PM  Visual Foot Exam completed.:  Yes  Is there  a history of foot ulcer?:  No Can the patient see the bottom of their feet?:  Yes Are the shoes appropriate in style and fit?:  No (Comment: bedroom slippers) Is there swelling or and abnormal foot shape?:  Yes (Comment: charcot joint foot appearance) Are the toenails long?:  No Are the toenails thick?:  No Do you have pain in calf while walking?:  No Is there a claw toe deformity?:  No Is there elevated skin temparature?:  No Is there limited skin dorsiflexion?:  No Is there foot or ankle muscle weakness?:  Yes Are the toenails ingrown?:  No Normal Range of Motion:  Yes Pulse Foot Exam completed.:  Yes  Right posterior  Tibialias:  Present Left posterior Tibialias:  Present  Right Dorsalis Pedis:  Present Left Dorsalis Pedis:  Present  Sensory Foot Exam Completed.:  Yes Semmes-Weinstein Monofilament Test R Foot Test Control:  Pos L Foot Test Control:  Pos  R Site 1-Great Toe:  Neg L Site 1-Great Toe:  Neg  R Site 4:  Neg L Site 4:  Neg  R Site 5:  Neg L Site 5:  Neg    Comments:  Monofilament positive at points 3, 6, 8, 10 bilateral feet.  Negative all other locations.      Results for orders placed or performed in visit on 05/18/17  POCT glycosylated hemoglobin (Hb A1C)  Result Value Ref Range   Hemoglobin A1C 6.6       Assessment & Plan:   Problem List Items Addressed This Visit      Cardiovascular and Mediastinum   Essential hypertension CMA called Dr. Assunta Gambles office and confirmed new medication changes.  Pt is taking valsartan-HCTZ 325-35 mg once daily and hydralazine 50 mg tid.  Pt is a poor medication historian and may need assistance with dosing medications.  Requested she bring her medications and her most recent list of medications with her at each visit.  Plan: 1. Continue medications as per Dr. Juleen China for now.  Pt states she will be here for a repeat UA next week, so will ask CMA to check BP at that time as well.   Relevant Medications   valsartan-hydrochlorothiazide (DIOVAN-HCT) 320-25 MG tablet     Endocrine   Diabetes mellitus, type 2 (HCC) - Primary A1c at goal with reduction of metformin at last visit.  Pt with worsening neuropathy that may not be associated with T2DM. Ongoing nephropathy followed by Dr. Juleen China at [facility].  Plan: 1. Discussed healthy diet changes. 2. Check POCT A1c today. 3. Recent CMP normal. 4. Continue metformin 500 mg once daily.    Relevant Medications   valsartan-hydrochlorothiazide (DIOVAN-HCT) 320-25 MG tablet   Other Relevant Orders   POCT glycosylated hemoglobin (Hb A1C) (Completed)    Other Visit Diagnoses    Neuropathy      Pt with back pain and spine-related nerve pain with former sciatica.  Now managed by pain clinic MD and taking Oxycontin 10 mg bid and gabapentin 800 mg tid.  Worsening bilateral pedal cramping sensation without foot spasm or muscle tenderness and pedal numbness with some preserved monofilament sensation of 5th metatarsal and phalanx.  Probable worsening of neuropathic pain not associated with T2DM.  Plan: 1. CONTINUE gabapentin 800 mg tid and Oxycontin 10 mg bid.  Continue pain clinic management. 2. START amitriptyline 10 mg one tablet at hs prn neuropathic pain of numbness and cramping.  BEERS list medication - Started with caution and instructions given to patient  about risks of dizziness, increased risk of falls and to call with any adverse effects. UPDATE: pt called on 5/23 and stated amitriptyline helped greatly with pain, but did cause a drowsy feeling the following morning.  Instructed to take only 1/2 tablet at hs for 1 week and can increase dose as tolerated to 1 full tablet.  Follow up with pain clinic MD to discuss utilizing amitriptyline instead of Oxycontin per pt request to try to quit taking Oxycontin.   Relevant Medications   amitriptyline (ELAVIL) 10 MG tablet   Other Relevant Orders   POCT glycosylated hemoglobin (Hb A1C) (Completed)      Meds ordered this encounter  Medications  . valsartan-hydrochlorothiazide (DIOVAN-HCT) 320-25 MG tablet    Sig: Take 1 tablet by mouth daily.  . Doxycycline Hyclate 50 MG TABS    Sig: Take by mouth.  Marland Kitchen amitriptyline (ELAVIL) 10 MG tablet    Sig: Take 1 tablet (10 mg total) by mouth at bedtime as needed for sleep.    Dispense:  30 tablet    Refill:  2    Order Specific Question:   Supervising Provider    Answer:   Olin Hauser [[id number]]      Follow up plan: Return 7 days for worsening symptoms or persistent symptoms.   Cassell Smiles, DNP, AGPCNP-BC Adult Gerontology Primary Care Nurse Practitioner [facility] 05/18/2017, 1:34 PM

## 2017-05-19 ENCOUNTER — Emergency Department
Admission: EM | Admit: 2017-05-19 | Discharge: 2017-05-19 | Disposition: A | Discharge status: Home / Self Care | Payer: Medicare HMO | Attending: Emergency Medicine | Admitting: Emergency Medicine

## 2017-05-19 ENCOUNTER — Encounter: Payer: Self-pay | Admitting: Intensive Care

## 2017-05-19 ENCOUNTER — Telehealth: Payer: Self-pay | Admitting: Nurse Practitioner

## 2017-05-19 DIAGNOSIS — N189 Chronic kidney disease, unspecified: Secondary | ICD-10-CM | POA: Insufficient documentation

## 2017-05-19 DIAGNOSIS — J449 Chronic obstructive pulmonary disease, unspecified: Secondary | ICD-10-CM | POA: Diagnosis not present

## 2017-05-19 DIAGNOSIS — R5381 Other malaise: Secondary | ICD-10-CM | POA: Insufficient documentation

## 2017-05-19 DIAGNOSIS — I129 Hypertensive chronic kidney disease with stage 1 through stage 4 chronic kidney disease, or unspecified chronic kidney disease: Secondary | ICD-10-CM | POA: Diagnosis not present

## 2017-05-19 DIAGNOSIS — Z7984 Long term (current) use of oral hypoglycemic drugs: Secondary | ICD-10-CM | POA: Insufficient documentation

## 2017-05-19 DIAGNOSIS — R350 Frequency of micturition: Secondary | ICD-10-CM | POA: Diagnosis not present

## 2017-05-19 DIAGNOSIS — Z87891 Personal history of nicotine dependence: Secondary | ICD-10-CM | POA: Diagnosis not present

## 2017-05-19 DIAGNOSIS — E1122 Type 2 diabetes mellitus with diabetic chronic kidney disease: Secondary | ICD-10-CM | POA: Insufficient documentation

## 2017-05-19 DIAGNOSIS — R42 Dizziness and giddiness: Secondary | ICD-10-CM | POA: Diagnosis not present

## 2017-05-19 DIAGNOSIS — Z79899 Other long term (current) drug therapy: Secondary | ICD-10-CM | POA: Diagnosis not present

## 2017-05-19 DIAGNOSIS — Z7982 Long term (current) use of aspirin: Secondary | ICD-10-CM | POA: Insufficient documentation

## 2017-05-19 LAB — URINALYSIS, COMPLETE (UACMP) WITH MICROSCOPIC
BACTERIA UA: NONE SEEN
BILIRUBIN URINE: NEGATIVE
Glucose, UA: NEGATIVE mg/dL
Hgb urine dipstick: NEGATIVE
Ketones, ur: NEGATIVE mg/dL
NITRITE: NEGATIVE
PH: 7 (ref 5.0–8.0)
Protein, ur: NEGATIVE mg/dL
Specific Gravity, Urine: 1.006 (ref 1.005–1.030)

## 2017-05-19 LAB — BASIC METABOLIC PANEL
ANION GAP: 5 (ref 5–15)
BUN: 23 mg/dL — ABNORMAL HIGH (ref 6–20)
CALCIUM: 9 mg/dL (ref 8.9–10.3)
CHLORIDE: 104 mmol/L (ref 101–111)
CO2: 29 mmol/L (ref 22–32)
CREATININE: 1.03 mg/dL — AB (ref 0.44–1.00)
GFR calc non Af Amer: 52 mL/min — ABNORMAL LOW (ref >60)
Glucose, Bld: 128 mg/dL — ABNORMAL HIGH (ref 65–99)
Potassium: 4.1 mmol/L (ref 3.5–5.1)
SODIUM: 138 mmol/L (ref 135–145)

## 2017-05-19 LAB — CBC WITH DIFFERENTIAL/PLATELET
BASOS PCT: 1 %
Basophils Absolute: 0.2 10*3/uL — ABNORMAL HIGH (ref 0–0.1)
EOS ABS: 0.5 10*3/uL (ref 0–0.7)
Eosinophils Relative: 4 %
HCT: 37.2 % (ref 35.0–47.0)
HEMOGLOBIN: 12.1 g/dL (ref 12.0–16.0)
LYMPHS ABS: 2.4 10*3/uL (ref 1.0–3.6)
Lymphocytes Relative: 21 %
MCH: 28.9 pg (ref 26.0–34.0)
MCHC: 32.5 g/dL (ref 32.0–36.0)
MCV: 89 fL (ref 80.0–100.0)
Monocytes Absolute: 0.9 10*3/uL (ref 0.2–0.9)
Monocytes Relative: 7 %
NEUTROS ABS: 7.9 10*3/uL — AB (ref 1.4–6.5)
NEUTROS PCT: 67 %
Platelets: 200 10*3/uL (ref 150–440)
RBC: 4.18 MIL/uL (ref 3.80–5.20)
RDW: 13.9 % (ref 11.5–14.5)
WBC: 11.9 10*3/uL — ABNORMAL HIGH (ref 3.6–11.0)

## 2017-05-19 MED ORDER — SODIUM CHLORIDE 0.9 % IV BOLUS (SEPSIS): 1000.0000 mL | Freq: Once | INTRAVENOUS | Status: DC

## 2017-05-19 NOTE — Telephone Encounter (Signed)
Pt was told to call about medication and side effects [phone]

## 2017-05-19 NOTE — ED Provider Notes (Signed)
[facility] Provider Note   CSN: [id number] Arrival date & time: 05/19/17  1536     History   Chief Complaint Chief Complaint  Patient presents with  . Recurrent UTI    HPI Murel V Mainer is a 76 y.o. female history COPD, diabetes, hypertension, HL, here presenting with dysuria, dizziness. Patient states that she was seen in the ED about a week ago and was prescribed Keflex. She states that she was called back when the urine culture was resistant to Keflex and she was prescribed 3 doses of fosfomycin. She states that she took her last dose today. She states that she has seen her doctor in the meantime and her blood pressure medicines were increased because her blood pressure was high. She states that she is lightheaded dizzy but denies passing out. She is urinating frequently but denies any dysuria. She denies flank pain. She states that Elavil was added yesterday and called PCP, who thought it may contribute to her symptoms. Denies fevers.   The history is provided by the patient.    Past Medical History:  Diagnosis Date  . COPD (chronic obstructive pulmonary disease) ([place])   . Diabetes mellitus without complication ([place])   . Hyperlipidemia   . Hypertension     Patient Active Problem List   Diagnosis Date Noted  . Malignant hypertension 01/29/2017  . Chronic respiratory failure with hypoxia, on home O2 therapy ([place]) 12/24/2016  . Incomplete emptying of bladder 07/19/2016  . Bacteriuria 07/17/2016  . Infection due to ESBL-producing Escherichia coli 04/21/2016  . Frequent UTI 04/21/2016  . Sepsis ([place]) 04/18/2016  . UTI (lower urinary tract infection) 04/18/2016  . Type 2 diabetes mellitus ([place]) 04/18/2016  . OSA on CPAP 04/18/2016  . Bladder infection, chronic 04/17/2016  . Calculus of kidney 04/17/2016  . Community acquired pneumonia 02/28/2016  . Pneumonia 02/28/2016  . [name] (chronic kidney disease) 07/12/2015  . Diabetes mellitus, type 2 ([place])  06/11/2015  . Arthritis, degenerative 06/11/2015  . Hypercholesteremia 06/11/2015  . 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 Injury of kidney 04/14/2015  . Essential hypertension 04/14/2015  . Blood glucose elevated 04/14/2015  . Apnea, sleep 04/14/2015  . COPD (chronic obstructive pulmonary disease) ([place]) 04/14/2015  . DDD (degenerative disc disease), lumbar 08/01/2014  . Lumbar canal stenosis 05/11/2014  . Neuritis or radiculitis due to rupture of lumbar intervertebral disc 05/11/2014  . 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 Lumbosacral spondylosis 05/11/2014  . Thoracic and lumbosacral neuritis 05/11/2014  . Accelerating angina ([place]) 03/13/2014    Past Surgical History:  Procedure Laterality Date  . ABDOMINAL HYSTERECTOMY    . SPINE SURGERY      OB History    No data available       Home Medications    Prior to Admission medications   Medication Sig Start Date End Date Taking? Authorizing Provider  ACCU-CHEK AVIVA PLUS test strip 1 strip by Other route 2 (two) times daily. 07/15/16   [provider]  ACCU-CHEK SOFTCLIX LANCETS lancets 1 each by Other route 2 (two) times daily. 06/17/16   [provider]  albuterol (PROVENTIL) (2.5 MG/3ML) 0.083% nebulizer solution Take 3 mLs (2.5 mg total) by nebulization every 6 (six) hours as needed for wheezing. 12/01/16   Arlis Porta., MD  albuterol (VENTOLIN HFA) 108 (90 Base) MCG/ACT inhaler Inhale 2 puffs into the lungs every 6 (six) hours as needed for wheezing or shortness of breath. 05/12/17   Nobie Putnam  J, DO  amitriptyline (ELAVIL) 10 MG tablet Take 1 tablet (10 mg total) by mouth at bedtime as needed for sleep. 05/18/17   Mikey College, NP  aspirin EC 325 MG tablet Take 325 mg by mouth daily.    [provider]  Blood Glucose Monitoring Suppl (ACCU-CHEK AVIVA PLUS) w/Device KIT USE AS DIRECTED 01/12/17   Arlis Porta., MD  carvedilol  (COREG) 25 MG tablet Take 1 tablet (25 mg total) by mouth 2 (two) times daily. 12/01/16   Arlis Porta., MD  cephALEXin (KEFLEX) 500 MG capsule Take 1 capsule (500 mg total) by mouth 2 (two) times daily. 05/11/17 05/21/17  Gregor Hams, MD  clonazePAM (KLONOPIN) 0.5 MG tablet 0.5 mg at bedtime.  07/04/15   [provider]  cloNIDine (CATAPRES) 0.1 MG tablet Take 1 tab up to 3 times a day for any BPs >170.  Hold dose if BP lower than 170. 01/15/17   Arlis Porta., MD  Doxycycline Hyclate 50 MG TABS Take by mouth.    [provider]  Fluticasone-Salmeterol (ADVAIR DISKUS) 250-50 MCG/DOSE AEPB Inhale 1 puff into the lungs 2 (two) times daily. 05/04/17   Karamalegos, Devonne Doughty, DO  furosemide (LASIX) 20 MG tablet TAKE ONE TABLET BY MOUTH EVERY DAY Patient not taking: Reported on 05/18/2017 12/09/16   Arlis Porta., MD  gabapentin (NEURONTIN) 800 MG tablet Take 1 tablet (800 mg total) by mouth 3 (three) times daily. 12/01/16   Arlis Porta., MD  hydrALAZINE (APRESOLINE) 25 MG tablet Take 50 mg by mouth 3 (three) times daily.  04/23/17   [provider]  lisinopril (PRINIVIL,ZESTRIL) 20 MG tablet Take 1 tablet (20 mg total) by mouth daily. Patient not taking: Reported on 05/11/2017 08/25/16   Arlis Porta., MD  metFORMIN (GLUCOPHAGE) 500 MG tablet TAKE ONE TABLET BY MOUTH EVERY MORNING WITH BREAKFAST 03/24/17   Arlis Porta., MD  montelukast (SINGULAIR) 10 MG tablet TAKE ONE TABLET BY MOUTH AT BEDTIME 03/24/17   Arlis Porta., MD  mupirocin ointment [facility]) 2 % One application as directed three times a day. Patient not taking: Reported on 05/18/2017 12/01/16   Arlis Porta., MD  OXYCONTIN 10 MG 12 hr tablet Take 10 mg by mouth 2 (two) times daily.    [provider]  OXYGEN Inhale 2 L into the lungs continuous.    [provider]  potassium chloride (K-DUR) 10 MEQ tablet TAKE ONE TABLET BY MOUTH TWICE DAILY  02/05/17   Arlis Porta., MD  SPIRIVA HANDIHALER 18 MCG inhalation capsule Place 1 capsule (18 mcg total) into inhaler and inhale daily. 12/29/16   Arlis Porta., MD  valACYclovir (VALTREX) 1000 MG tablet Take 1 tablet (1,000 mg total) by mouth daily as needed (for fever blisters). 04/19/16   Gladstone Lighter, MD  valsartan-hydrochlorothiazide (DIOVAN-HCT) 320-25 MG tablet Take 1 tablet by mouth daily.    [provider]    Family History Family History  Problem Relation Age of Onset  . Hypertension Mother   . Cancer Father   . Hypertension Paternal Grandfather     Social History Social History  Substance Use Topics  . Smoking status: Former Smoker    Packs/day: 0.50    Quit date: 04/28/2015  . Smokeless tobacco: Never Used  . Alcohol use No     Allergies   Nitrofurantoin   Review of Systems Review of  Systems  Genitourinary: Positive for frequency.  Neurological: Positive for dizziness.  All other systems reviewed and are negative.    Physical Exam Updated Vital Signs BP (!) 114/54 (BP Location: Left Arm)   Pulse 69   Temp 98 F (36.7 C) (Oral)   Resp 18   Ht 5' 6"  (1.676 m)   Wt 100.2 kg (221 lb)   SpO2 94%   BMI 35.67 kg/m   Physical Exam  Constitutional: She is oriented to person, place, and time. She appears well-developed.  HENT:  Head: Normocephalic.  Mouth/Throat: Oropharynx is clear and moist.  Eyes: EOM are normal. Pupils are equal, round, and reactive to light.  Neck: Normal range of motion. Neck supple.  Cardiovascular: Normal rate, regular rhythm and normal heart sounds.   Pulmonary/Chest: Effort normal and breath sounds normal. No respiratory distress. She has no wheezes.  Abdominal: Soft. Bowel sounds are normal. She exhibits no distension. There is no tenderness.  Nontender, no CVAT   Musculoskeletal: Normal range of motion.  Neurological: She is alert and oriented to person, place, and time. No cranial nerve deficit.    Skin: Skin is warm.  Psychiatric: She has a normal mood and affect.  Nursing note and vitals reviewed.    ED Treatments / Results  Labs (all labs ordered are listed, but only abnormal results are displayed) Labs Reviewed  URINALYSIS, COMPLETE (UACMP) WITH MICROSCOPIC - Abnormal; Notable for the following:       Result Value   Color, Urine STRAW (*)    APPearance CLEAR (*)    Leukocytes, UA TRACE (*)    Squamous Epithelial / LPF 0-5 (*)    All other components within normal limits  BASIC METABOLIC PANEL - Abnormal; Notable for the following:    Glucose, Bld 128 (*)    BUN 23 (*)    Creatinine, Ser 1.03 (*)    GFR calc non Af Amer 52 (*)    All other components within normal limits  CBC WITH DIFFERENTIAL/PLATELET - Abnormal; Notable for the following:    WBC 11.9 (*)    Neutro Abs 7.9 (*)    Basophils Absolute 0.2 (*)    All other components within normal limits  URINE CULTURE    EKG  EKG Interpretation None       Radiology No results found.  Procedures Procedures (including critical care time)  Medications Ordered in ED Medications - No data to display   Initial Impression / Assessment and Plan / ED Course  I have reviewed the triage vital signs and the nursing notes.  Pertinent labs & imaging results that were available during my care of the patient were reviewed by me and considered in my medical decision making (see chart for details).    Kately V Sensabaugh is a 76 y.o. female here with urinary frequency, dizziness. BP 114/54 in the ED, usually hypertensive. Repeat labs showed WBC 11. Cr 1, stable. Repeat UA showed small leuk, no WBC or RBC, neg nitrate. I think her symptoms is unlikely from recurrent UTI as she is adequately treated with fosfomycin. No signs of pyelo. Symptoms likely from BP meds that were changed. Will have her see PCP for recheck BP. Will not need another course of abx, will repeat urine culture.   Final Clinical Impressions(s) / ED  Diagnoses   Final diagnoses:  None    New Prescriptions New Prescriptions   No medications on file     Drenda Freeze, MD 05/19/17 1750

## 2017-05-19 NOTE — ED Notes (Signed)
Patient reports being dx with UTI on Friday and finishing antibiotics today. States she is still having frequent urination, along with chills and general malaise. Patient denies fever. Denies any pain, nausea or vomiting.

## 2017-05-19 NOTE — ED Triage Notes (Addendum)
PAtient presents to ER with c/o generalized body aches, chills, and frequent urination. Diagnosed with UTI last Thursday and states "I do not think my UTI is gone" Denies burning upon urination or fever at home. A& O x4. Patient wears continuous 2L O2. HX CPOD. Denies chest pain or SOB. Denies any pain at this time

## 2017-05-19 NOTE — Discharge Instructions (Signed)
Stop elavil for now.   Check your blood pressure frequently.   See your doctor in a week to recheck your blood pressure.   Return to ER if you have worse trouble urinating, fever, abdominal pain, flank pain, worse dizziness, passing out.

## 2017-05-19 NOTE — Telephone Encounter (Signed)
Patient took amitriptyline 10 mg last night and felt very sluggish this morning.  She notes her numbness and cramping pain was significantly reduced with this medication.    Instructed to take 1/2 pill tonight to see if she is less sluggish in am.  Continue 1/2 pill x 1 week and resume whole tablet if needed next Wednesday.    Pt asks if she can take this medication instead of her oxycontin.  Encouraged pt to discuss this medication with her pain clinic MD at next appointment.  Confirmed that it is possible to transition to this medication if it controls her pain without oxycontin and does not cause drowsiness.

## 2017-05-20 DIAGNOSIS — J449 Chronic obstructive pulmonary disease, unspecified: Secondary | ICD-10-CM | POA: Diagnosis not present

## 2017-05-20 NOTE — Progress Notes (Signed)
I have reviewed this encounter including the documentation in this note and/or discussed this patient with the provider, Cassell Smiles, AGPCNP-BC. I am certifying that I agree with the content of this note as supervising physician.  Nobie Putnam, [facility] 05/20/2017, 1:28 PM

## 2017-05-21 LAB — URINE CULTURE

## 2017-05-25 ENCOUNTER — Telehealth: Payer: Self-pay | Admitting: Nurse Practitioner

## 2017-05-25 ENCOUNTER — Encounter: Payer: Self-pay | Admitting: Nurse Practitioner

## 2017-05-25 NOTE — Telephone Encounter (Signed)
Follow up phone call after new medication and emergency room visit.   Pt feeling ok.  Pt no longer taking amitriptyline.  Only took for 2 days.  Instructed pt to stop taking completely and not restart r/t possible side effects of dizziness.  Pt verbalized understanding.  Pt still having significant burning pain in feet and would like an alternative medication.

## 2017-05-31 DIAGNOSIS — I129 Hypertensive chronic kidney disease with stage 1 through stage 4 chronic kidney disease, or unspecified chronic kidney disease: Secondary | ICD-10-CM | POA: Diagnosis not present

## 2017-05-31 DIAGNOSIS — E1122 Type 2 diabetes mellitus with diabetic chronic kidney disease: Secondary | ICD-10-CM | POA: Diagnosis not present

## 2017-05-31 DIAGNOSIS — N183 Chronic kidney disease, stage 3 (moderate): Secondary | ICD-10-CM | POA: Diagnosis not present

## 2017-05-31 DIAGNOSIS — N390 Urinary tract infection, site not specified: Secondary | ICD-10-CM | POA: Diagnosis not present

## 2017-06-02 DIAGNOSIS — Z87891 Personal history of nicotine dependence: Secondary | ICD-10-CM | POA: Diagnosis not present

## 2017-06-02 DIAGNOSIS — R0602 Shortness of breath: Secondary | ICD-10-CM | POA: Diagnosis not present

## 2017-06-02 DIAGNOSIS — M79652 Pain in left thigh: Secondary | ICD-10-CM | POA: Diagnosis not present

## 2017-06-02 DIAGNOSIS — E119 Type 2 diabetes mellitus without complications: Secondary | ICD-10-CM | POA: Diagnosis not present

## 2017-06-02 DIAGNOSIS — M25562 Pain in left knee: Secondary | ICD-10-CM | POA: Diagnosis not present

## 2017-06-02 DIAGNOSIS — J449 Chronic obstructive pulmonary disease, unspecified: Secondary | ICD-10-CM | POA: Diagnosis not present

## 2017-06-02 DIAGNOSIS — Z8249 Family history of ischemic heart disease and other diseases of the circulatory system: Secondary | ICD-10-CM | POA: Diagnosis not present

## 2017-06-02 DIAGNOSIS — I10 Essential (primary) hypertension: Secondary | ICD-10-CM | POA: Diagnosis not present

## 2017-06-02 DIAGNOSIS — R600 Localized edema: Secondary | ICD-10-CM | POA: Diagnosis not present

## 2017-06-02 DIAGNOSIS — Z9981 Dependence on supplemental oxygen: Secondary | ICD-10-CM | POA: Diagnosis not present

## 2017-06-02 DIAGNOSIS — S8012XA Contusion of left lower leg, initial encounter: Secondary | ICD-10-CM | POA: Diagnosis not present

## 2017-06-03 ENCOUNTER — Emergency Department: Payer: Medicare HMO

## 2017-06-03 ENCOUNTER — Encounter: Payer: Self-pay | Admitting: *Deleted

## 2017-06-03 ENCOUNTER — Emergency Department
Admission: EM | Admit: 2017-06-03 | Discharge: 2017-06-03 | Disposition: A | Discharge status: Home / Self Care | Payer: Medicare HMO | Attending: Emergency Medicine | Admitting: Emergency Medicine

## 2017-06-03 DIAGNOSIS — J449 Chronic obstructive pulmonary disease, unspecified: Secondary | ICD-10-CM | POA: Diagnosis not present

## 2017-06-03 DIAGNOSIS — E1122 Type 2 diabetes mellitus with diabetic chronic kidney disease: Secondary | ICD-10-CM | POA: Insufficient documentation

## 2017-06-03 DIAGNOSIS — Z79899 Other long term (current) drug therapy: Secondary | ICD-10-CM | POA: Diagnosis not present

## 2017-06-03 DIAGNOSIS — M25562 Pain in left knee: Secondary | ICD-10-CM | POA: Diagnosis present

## 2017-06-03 DIAGNOSIS — M7989 Other specified soft tissue disorders: Secondary | ICD-10-CM | POA: Diagnosis not present

## 2017-06-03 DIAGNOSIS — I129 Hypertensive chronic kidney disease with stage 1 through stage 4 chronic kidney disease, or unspecified chronic kidney disease: Secondary | ICD-10-CM | POA: Diagnosis not present

## 2017-06-03 DIAGNOSIS — Z7982 Long term (current) use of aspirin: Secondary | ICD-10-CM | POA: Insufficient documentation

## 2017-06-03 DIAGNOSIS — R300 Dysuria: Secondary | ICD-10-CM

## 2017-06-03 DIAGNOSIS — M7122 Synovial cyst of popliteal space [Baker], left knee: Secondary | ICD-10-CM | POA: Diagnosis not present

## 2017-06-03 DIAGNOSIS — N189 Chronic kidney disease, unspecified: Secondary | ICD-10-CM | POA: Diagnosis not present

## 2017-06-03 DIAGNOSIS — M79662 Pain in left lower leg: Secondary | ICD-10-CM | POA: Diagnosis not present

## 2017-06-03 DIAGNOSIS — Z87891 Personal history of nicotine dependence: Secondary | ICD-10-CM | POA: Insufficient documentation

## 2017-06-03 DIAGNOSIS — Z794 Long term (current) use of insulin: Secondary | ICD-10-CM | POA: Diagnosis not present

## 2017-06-03 DIAGNOSIS — R600 Localized edema: Secondary | ICD-10-CM | POA: Diagnosis not present

## 2017-06-03 LAB — CBC WITH DIFFERENTIAL/PLATELET
BASOS PCT: 1 %
Basophils Absolute: 0.1 10*3/uL (ref 0–0.1)
EOS PCT: 3 %
Eosinophils Absolute: 0.3 10*3/uL (ref 0–0.7)
HCT: 36.3 % (ref 35.0–47.0)
Hemoglobin: 11.9 g/dL — ABNORMAL LOW (ref 12.0–16.0)
LYMPHS PCT: 19 %
Lymphs Abs: 1.9 10*3/uL (ref 1.0–3.6)
MCH: 29.3 pg (ref 26.0–34.0)
MCHC: 32.9 g/dL (ref 32.0–36.0)
MCV: 89.2 fL (ref 80.0–100.0)
Monocytes Absolute: 0.7 10*3/uL (ref 0.2–0.9)
Monocytes Relative: 7 %
NEUTROS ABS: 7.5 10*3/uL — AB (ref 1.4–6.5)
Neutrophils Relative %: 70 %
PLATELETS: 209 10*3/uL (ref 150–440)
RBC: 4.07 MIL/uL (ref 3.80–5.20)
RDW: 13.8 % (ref 11.5–14.5)
WBC: 10.5 10*3/uL (ref 3.6–11.0)

## 2017-06-03 LAB — URINALYSIS, COMPLETE (UACMP) WITH MICROSCOPIC
BILIRUBIN URINE: NEGATIVE
GLUCOSE, UA: NEGATIVE mg/dL
HGB URINE DIPSTICK: NEGATIVE
Ketones, ur: NEGATIVE mg/dL
LEUKOCYTES UA: NEGATIVE
NITRITE: NEGATIVE
PH: 6 (ref 5.0–8.0)
Protein, ur: NEGATIVE mg/dL
SPECIFIC GRAVITY, URINE: 1.014 (ref 1.005–1.030)

## 2017-06-03 LAB — COMPREHENSIVE METABOLIC PANEL
ALK PHOS: 55 U/L (ref 38–126)
ALT: 16 U/L (ref 14–54)
ANION GAP: 8 (ref 5–15)
AST: 17 U/L (ref 15–41)
Albumin: 4 g/dL (ref 3.5–5.0)
BILIRUBIN TOTAL: 0.7 mg/dL (ref 0.3–1.2)
BUN: 28 mg/dL — AB (ref 6–20)
CALCIUM: 9.1 mg/dL (ref 8.9–10.3)
CO2: 29 mmol/L (ref 22–32)
Chloride: 101 mmol/L (ref 101–111)
Creatinine, Ser: 1.21 mg/dL — ABNORMAL HIGH (ref 0.44–1.00)
GFR calc Af Amer: 49 mL/min — ABNORMAL LOW (ref >60)
GFR, EST NON AFRICAN AMERICAN: 43 mL/min — AB (ref >60)
Glucose, Bld: 253 mg/dL — ABNORMAL HIGH (ref 65–99)
POTASSIUM: 4 mmol/L (ref 3.5–5.1)
Sodium: 138 mmol/L (ref 135–145)
TOTAL PROTEIN: 7.2 g/dL (ref 6.5–8.1)

## 2017-06-03 MED ORDER — SODIUM CHLORIDE 0.9 % IV BOLUS (SEPSIS)
1000.0000 mL | Freq: Once | INTRAVENOUS | Status: AC
  Administered 2017-06-03: 1000 mL via INTRAVENOUS

## 2017-06-03 MED ORDER — GENTAMICIN SULFATE 40 MG/ML IJ SOLN
5.0000 mg/kg | INTRAVENOUS | Status: DC
  Administered 2017-06-03: 500 mg via INTRAVENOUS
  Filled 2017-06-03: qty 12.5

## 2017-06-03 NOTE — ED Notes (Addendum)
Blue lab tube sent with bloodwork

## 2017-06-03 NOTE — ED Triage Notes (Signed)
Pt presents to ED reporting left leg pain beginning last week. Pt reports swelling  And pain in left leg. Tenderness noted behind the knee. Pt denies having injuried leg. Color is appropriate and sensation is intact. Pt able to bear weight on le and verbalized "it is just tight and sore"   Pt also reports having been dx with a UTI 2 weeks ago and was taking PO antibiotics and reports symptoms have not improved. Pt reports increased frequency and right sided flank pain. No fevers or NVD reported.

## 2017-06-03 NOTE — ED Provider Notes (Signed)
[facility] Emergency Department Provider Note  ____________________________________________   First MD Initiated Contact with Patient 06/03/17 1503     (approximate)  I have reviewed the triage vital signs and the nursing notes.   HISTORY  Chief Complaint Recurrent UTI and Leg Pain   HPI Megan Bolton is a 76 y.o. female who presents to the emergency department with 2 issues. First she reports 1 week of moderate severity aching and throbbing discomfort in the back of her left knee. The pain is worse with movement and improved with rest. It is nonradiating. She has no history of deep vein thrombus or pulmonary embolism. She denies trauma. She denies numbness or weakness. She also reports a long-standing history of multiple UTIs and says that she has been "battling a UTI for one month". She was initially seen in our emergency department and diagnosed with a UTI and given unknown antibiotics, however was called back shortly thereafter that her culture showed resistant Escherichia coli and she received a dose of fosfomycin. She then subsequently followed up with her primary care physicianand according to the patient she had a subsequent culture that again showed Escherichia coli but she was not given any more antibiotics. She is frustrated because she has persistent dysuria and frequency. She denies fevers or chills. She denies back pain or flank pain.   Past Medical History:  Diagnosis Date  . COPD (chronic obstructive pulmonary disease) ([place])   . Diabetes mellitus without complication ([place])   . Hyperlipidemia   . Hypertension     Patient Active Problem List   Diagnosis Date Noted  . Malignant hypertension 01/29/2017  . Chronic respiratory failure with hypoxia, on home O2 therapy ([place]) 12/24/2016  . Incomplete emptying of bladder 07/19/2016  . Bacteriuria 07/17/2016  . Infection due to ESBL-producing Escherichia coli 04/21/2016  . Frequent UTI 04/21/2016    . Sepsis ([place]) 04/18/2016  . UTI (lower urinary tract infection) 04/18/2016  . OSA on CPAP 04/18/2016  . Bladder infection, chronic 04/17/2016  . Calculus of kidney 04/17/2016  . Community acquired pneumonia 02/28/2016  . Pneumonia 02/28/2016  . [name] (chronic kidney disease) 07/12/2015  . Controlled type 2 diabetes mellitus with diabetic neuropathy, without long-term current use of insulin ([place]) 06/11/2015  . Arthritis, degenerative 06/11/2015  . Hypercholesteremia 06/11/2015  . 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 Injury of kidney 04/14/2015  . Essential hypertension 04/14/2015  . Blood glucose elevated 04/14/2015  . Apnea, sleep 04/14/2015  . COPD (chronic obstructive pulmonary disease) ([place]) 04/14/2015  . DDD (degenerative disc disease), lumbar 08/01/2014  . Lumbar canal stenosis 05/11/2014  . Neuritis or radiculitis due to rupture of lumbar intervertebral disc 05/11/2014  . 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 Lumbosacral spondylosis 05/11/2014  . Thoracic and lumbosacral neuritis 05/11/2014  . Accelerating angina ([place]) 03/13/2014    Past Surgical History:  Procedure Laterality Date  . ABDOMINAL HYSTERECTOMY    . SPINE SURGERY      Prior to Admission medications   Medication Sig Start Date End Date Taking? Authorizing Provider  ACCU-CHEK AVIVA PLUS test strip 1 strip by Other route 2 (two) times daily. 07/15/16   [provider]  ACCU-CHEK SOFTCLIX LANCETS lancets 1 each by Other route 2 (two) times daily. 06/17/16   [provider]  albuterol (PROVENTIL) (2.5 MG/3ML) 0.083% nebulizer solution Take 3 mLs (2.5 mg total) by nebulization every 6 (six) hours as needed for wheezing. 12/01/16   Coralie Common  Brooke Bonito., MD  albuterol (VENTOLIN HFA) 108 (90 Base) MCG/ACT inhaler Inhale 2 puffs into the lungs every 6 (six) hours as needed for wheezing or shortness of breath. 05/12/17   Karamalegos, Devonne Doughty, DO  aspirin EC 325 MG  tablet Take 325 mg by mouth daily.    [provider]  Blood Glucose Monitoring Suppl (ACCU-CHEK AVIVA PLUS) w/Device KIT USE AS DIRECTED 01/12/17   Arlis Porta., MD  carvedilol (COREG) 25 MG tablet Take 1 tablet (25 mg total) by mouth 2 (two) times daily. 12/01/16   Arlis Porta., MD  clonazePAM (KLONOPIN) 0.5 MG tablet 0.5 mg at bedtime.  07/04/15   [provider]  cloNIDine (CATAPRES) 0.1 MG tablet Take 1 tab up to 3 times a day for any BPs >170.  Hold dose if BP lower than 170. 01/15/17   Arlis Porta., MD  Doxycycline Hyclate 50 MG TABS Take by mouth.    [provider]  Fluticasone-Salmeterol (ADVAIR DISKUS) 250-50 MCG/DOSE AEPB Inhale 1 puff into the lungs 2 (two) times daily. 05/04/17   Karamalegos, Devonne Doughty, DO  furosemide (LASIX) 20 MG tablet TAKE ONE TABLET BY MOUTH EVERY DAY Patient not taking: Reported on 05/18/2017 12/09/16   Arlis Porta., MD  gabapentin (NEURONTIN) 800 MG tablet Take 1 tablet (800 mg total) by mouth 3 (three) times daily. 12/01/16   Arlis Porta., MD  hydrALAZINE (APRESOLINE) 25 MG tablet Take 50 mg by mouth 3 (three) times daily.  04/23/17   [provider]  lisinopril (PRINIVIL,ZESTRIL) 20 MG tablet Take 1 tablet (20 mg total) by mouth daily. Patient not taking: Reported on 05/11/2017 08/25/16   Arlis Porta., MD  metFORMIN (GLUCOPHAGE) 500 MG tablet TAKE ONE TABLET BY MOUTH EVERY MORNING WITH BREAKFAST 03/24/17   Arlis Porta., MD  montelukast (SINGULAIR) 10 MG tablet TAKE ONE TABLET BY MOUTH AT BEDTIME 03/24/17   Arlis Porta., MD  mupirocin ointment [facility]) 2 % One application as directed three times a day. Patient not taking: Reported on 05/18/2017 12/01/16   Arlis Porta., MD  OXYCONTIN 10 MG 12 hr tablet Take 10 mg by mouth 2 (two) times daily.    [provider]  OXYGEN Inhale 2 L into the lungs continuous.    [provider]  potassium chloride  (K-DUR) 10 MEQ tablet TAKE ONE TABLET BY MOUTH TWICE DAILY 02/05/17   Arlis Porta., MD  SPIRIVA HANDIHALER 18 MCG inhalation capsule Place 1 capsule (18 mcg total) into inhaler and inhale daily. 12/29/16   Arlis Porta., MD  valACYclovir (VALTREX) 1000 MG tablet Take 1 tablet (1,000 mg total) by mouth daily as needed (for fever blisters). 04/19/16   Gladstone Lighter, MD  valsartan-hydrochlorothiazide (DIOVAN-HCT) 320-25 MG tablet Take 1 tablet by mouth daily.    [provider]    Allergies Nitrofurantoin  Family History  Problem Relation Age of Onset  . Hypertension Mother   . Cancer Father   . Hypertension Paternal Grandfather     Social History Social History  Substance Use Topics  . Smoking status: Former Smoker    Packs/day: 0.50    Quit date: 04/28/2015  . Smokeless tobacco: Never Used  . Alcohol use No    Review of Systems Constitutional: No fever/chills Eyes: No visual changes. ENT: No sore throat. Cardiovascular: Denies chest pain. Respiratory: Denies shortness of breath. Gastrointestinal: No abdominal pain.  No  nausea, no vomiting.  No diarrhea.  No constipation. Genitourinary: Positive for dysuria. Musculoskeletal: Negative for back pain. Skin: Negative for rash. Neurological: Negative for headaches, focal weakness or numbness.   ____________________________________________   PHYSICAL EXAM:  VITAL SIGNS: ED Triage Vitals  Enc Vitals Group     BP 06/03/17 1234 (!) 115/43     Pulse Rate 06/03/17 1234 66     Resp 06/03/17 1234 18     Temp 06/03/17 1234 98.3 F (36.8 C)     Temp Source 06/03/17 1234 Oral     SpO2 06/03/17 1234 94 %     Weight 06/03/17 1228 221 lb (100.2 kg)     Height 06/03/17 1228 _0  (1.676 m)     Head Circumference --      Peak Flow --      Pain Score 06/03/17 1228 5     Pain Loc --      Pain Edu? --      Excl. in [employer]? --     Constitutional: Alert and oriented x 4 well appearing nontoxic no diaphoresis  speaks in full, clear sentences Eyes: PERRL EOMI. Head: Atraumatic. Nose: No congestion/rhinnorhea. Mouth/Throat: No trismus Neck: No stridor.   Cardiovascular: Normal rate, regular rhythm. Grossly normal heart sounds.  Good peripheral circulation. Respiratory: Normal respiratory effort.  No retractions. Lungs CTAB and moving good air Gastrointestinal: Soft nontender Musculoskeletal: Legs are equal in size no lower extremity edema she does have some tenderness in her left popliteal fossa Neurologic:  Normal speech and language. No gross focal neurologic deficits are appreciated. Skin:  Skin is warm, dry and intact. No rash noted. Psychiatric: Mood and affect are normal. Speech and behavior are normal.    ____________________________________________ _______________________________   LABS (all labs ordered are listed, but only abnormal results are displayed)  Labs Reviewed  COMPREHENSIVE METABOLIC PANEL - Abnormal; Notable for the following:       Result Value   Glucose, Bld 253 (*)    BUN 28 (*)    Creatinine, Ser 1.21 (*)    GFR calc non Af Amer 43 (*)    GFR calc Af Amer 49 (*)    All other components within normal limits  CBC WITH DIFFERENTIAL/PLATELET - Abnormal; Notable for the following:    Hemoglobin 11.9 (*)    Neutro Abs 7.5 (*)    All other components within normal limits  URINALYSIS, COMPLETE (UACMP) WITH MICROSCOPIC - Abnormal; Notable for the following:    Color, Urine YELLOW (*)    APPearance HAZY (*)    Bacteria, UA RARE (*)    Squamous Epithelial / LPF 0-5 (*)    All other components within normal limits  URINE CULTURE  GENTAMICIN LEVEL, RANDOM    Mild acute kidney injury __________________________________________  EKG   ____________________________________________  RADIOLOGY  Ultrasound shows a left sided Baker's cyst and may have ruptured ____________________________________________   PROCEDURES  Procedure(s) performed:  no  Procedures  Critical Care performed: no  Observation: no ____________________________________________   INITIAL IMPRESSION / ASSESSMENT AND PLAN / ED COURSE  Pertinent labs & imaging results that were available during my care of the patient were reviewed by me and considered in my medical decision making (see chart for details).  Regarding the patient's leg pain and her ultrasound shows a possibly ruptured Baker cyst. I explained to her that conservative management would be the primary treatment and that I would offer her follow-up with orthopedic surgery. She R he follows with  Dr. Rudene Christians regarding other issues and says she can see him this coming week. Regarding her dysuria her urinalysis today does show some bacteriuria although no other signs of infection. Given her persistent symptoms and her partially treated infection I sent a culture and I think it is reasonable to treat her with a single dose of gentamicin now     After a dose of gentamicin the patient remains well-appearing. She'll be discharged home with the previous plan. ____________________________________________   FINAL CLINICAL IMPRESSION(S) / ED DIAGNOSES  Final diagnoses:  Synovial cyst of left popliteal space  Dysuria      NEW MEDICATIONS STARTED DURING THIS VISIT:  New Prescriptions   No medications on file     Note:  This document was prepared using Dragon voice recognition software and may include unintentional dictation errors.     Darel Hong, MD 06/03/17 [phone]

## 2017-06-03 NOTE — Discharge Instructions (Signed)
Unfortunately the treatment of Baker's cyst is conservative and it is unlikely that you'll require surgery to fix it. Please continue taking your pain medication as are the prescribed and use Ace wraps, he compresses, or ice packs as needed for discomfort. Please follow-up with Dr. Rudene Christians as already scheduled for further evaluation. Regarding her dysuria please follow-up with your primary care physician who will be able to follow-up on your urine culture today. Return to the emergency department for any concerns such as worsening pain, fevers, chills, or for any other concerns.  It was a pleasure to take care of you today, and thank you for coming to our emergency department.  If you have any questions or concerns before leaving please ask the nurse to grab me and I'm more than happy to go through your aftercare instructions again.  If you were prescribed any opioid pain medication today such as Norco, Vicodin, Percocet, morphine, hydrocodone, or oxycodone please make sure you do not drive when you are taking this medication as it can alter your ability to drive safely.  If you have any concerns once you are home that you are not improving or are in fact getting worse before you can make it to your follow-up appointment, please do not hesitate to call 911 and come back for further evaluation.  Darel Hong MD  Results for orders placed or performed during the hospital encounter of 06/03/17  Comprehensive metabolic panel  Result Value Ref Range   Sodium 138 135 - 145 mmol/L   Potassium 4.0 3.5 - 5.1 mmol/L   Chloride 101 101 - 111 mmol/L   CO2 29 22 - 32 mmol/L   Glucose, Bld 253 (H) 65 - 99 mg/dL   BUN 28 (H) 6 - 20 mg/dL   Creatinine, Ser 1.21 (H) 0.44 - 1.00 mg/dL   Calcium 9.1 8.9 - 10.3 mg/dL   Total Protein 7.2 6.5 - 8.1 g/dL   Albumin 4.0 3.5 - 5.0 g/dL   AST 17 15 - 41 U/L   ALT 16 14 - 54 U/L   Alkaline Phosphatase 55 38 - 126 U/L   Total Bilirubin 0.7 0.3 - 1.2 mg/dL   GFR calc  non Af Amer 43 (L) >60 mL/min   GFR calc Af Amer 49 (L) >60 mL/min   Anion gap 8 5 - 15  CBC with Differential  Result Value Ref Range   WBC 10.5 3.6 - 11.0 K/uL   RBC 4.07 3.80 - 5.20 MIL/uL   Hemoglobin 11.9 (L) 12.0 - 16.0 g/dL   HCT 36.3 35.0 - 47.0 %   MCV 89.2 80.0 - 100.0 fL   MCH 29.3 26.0 - 34.0 pg   MCHC 32.9 32.0 - 36.0 g/dL   RDW 13.8 11.5 - 14.5 %   Platelets 209 150 - 440 K/uL   Neutrophils Relative % 70 %   Neutro Abs 7.5 (H) 1.4 - 6.5 K/uL   Lymphocytes Relative 19 %   Lymphs Abs 1.9 1.0 - 3.6 K/uL   Monocytes Relative 7 %   Monocytes Absolute 0.7 0.2 - 0.9 K/uL   Eosinophils Relative 3 %   Eosinophils Absolute 0.3 0 - 0.7 K/uL   Basophils Relative 1 %   Basophils Absolute 0.1 0 - 0.1 K/uL  Urinalysis, Complete w Microscopic  Result Value Ref Range   Color, Urine YELLOW (A) YELLOW   APPearance HAZY (A) CLEAR   Specific Gravity, Urine 1.014 1.005 - 1.030   pH 6.0 5.0 - 8.0  Glucose, UA NEGATIVE NEGATIVE mg/dL   Hgb urine dipstick NEGATIVE NEGATIVE   Bilirubin Urine NEGATIVE NEGATIVE   Ketones, ur NEGATIVE NEGATIVE mg/dL   Protein, ur NEGATIVE NEGATIVE mg/dL   Nitrite NEGATIVE NEGATIVE   Leukocytes, UA NEGATIVE NEGATIVE   RBC / HPF 0-5 0 - 5 RBC/hpf   WBC, UA 0-5 0 - 5 WBC/hpf   Bacteria, UA RARE (A) NONE SEEN   Squamous Epithelial / LPF 0-5 (A) NONE SEEN   Mucous PRESENT    Hyaline Casts, UA PRESENT    Dg Chest 2 View  Result Date: 05/11/2017 CLINICAL DATA:  Shortness of breath EXAM: CHEST  2 VIEW COMPARISON:  12/24/2016 FINDINGS: Cardiomegaly and aortic tortuosity, stable. Chronic interstitial coarsening with mild scarring or atelectasis at the bases. Chronic hyperinflation. Trace pleural effusions seen previously has resolved, as has cephalized blood flow. There is no edema, consolidation, effusion, or pneumothorax. IMPRESSION: COPD and chronic cardiomegaly.  No acute superimposed finding. Electronically Signed   By: Monte Fantasia M.D.   On:  05/11/2017 13:36   US Venous Img Lower Unilateral Left  Result Date: 06/03/2017 CLINICAL DATA:  Pain and swelling for 2 days EXAM: LEFT LOWER EXTREMITY VENOUS DOPPLER ULTRASOUND TECHNIQUE: Gray-scale sonography with graded compression, as well as color Doppler and duplex ultrasound were performed to evaluate the lower extremity deep venous systems from the level of the common femoral vein and including the common femoral, femoral, profunda femoral, popliteal and calf veins including the posterior tibial, peroneal and gastrocnemius veins when visible. The superficial great saphenous vein was also interrogated. Spectral Doppler was utilized to evaluate flow at rest and with distal augmentation maneuvers in the common femoral, femoral and popliteal veins. COMPARISON:  None. FINDINGS: Contralateral Common Femoral Vein: Respiratory phasicity is normal and symmetric with the symptomatic side. No evidence of thrombus. Normal compressibility. Common Femoral Vein: No evidence of thrombus. Normal compressibility, respiratory phasicity and response to augmentation. Saphenofemoral Junction: No evidence of thrombus. Normal compressibility and flow on color Doppler imaging. Profunda Femoral Vein: No evidence of thrombus. Normal compressibility and flow on color Doppler imaging. Femoral Vein: No evidence of thrombus. Normal compressibility, respiratory phasicity and response to augmentation. Popliteal Vein: No evidence of thrombus. Normal compressibility, respiratory phasicity and response to augmentation. Calf Veins: No evidence of thrombus. Normal compressibility and flow on color Doppler imaging. Superficial Great Saphenous Vein: No evidence of thrombus. Normal compressibility and flow on color Doppler imaging. Venous Reflux:  None. Other Findings: Elongated fluid collection is noted arising from the left popliteal fossa measuring 4.6 x 0.9 x 2.0 cm consistent with a popliteal cyst. Given its extension into the catheter may  have been recent rupture corresponding with the increasing pain. IMPRESSION: No evidence of DVT within the left lower extremity. Changes consistent with popliteal cyst. Some extension into the calf is noted which may be related to recent rupture. Electronically Signed   By: Inez Catalina M.D.   On: 06/03/2017 14:03

## 2017-06-04 ENCOUNTER — Ambulatory Visit: Payer: Commercial Managed Care - HMO | Admitting: Nurse Practitioner

## 2017-06-06 LAB — URINE CULTURE

## 2017-06-07 NOTE — ED Provider Notes (Signed)
The patient's culture came back for ESBL that is sensitive to gentamicin. I called the patient back if she feels significantly improved from previously. Her nephrologist did call her in a prescription for Augmentin and her ESBL appears to be sensitive to this as well. She is stable for continued outpatient management.   Darel Hong, MD 06/07/17 1243

## 2017-06-08 DIAGNOSIS — G4733 Obstructive sleep apnea (adult) (pediatric): Secondary | ICD-10-CM | POA: Diagnosis not present

## 2017-06-09 DIAGNOSIS — J449 Chronic obstructive pulmonary disease, unspecified: Secondary | ICD-10-CM | POA: Diagnosis not present

## 2017-06-09 DIAGNOSIS — J438 Other emphysema: Secondary | ICD-10-CM | POA: Diagnosis not present

## 2017-06-11 DIAGNOSIS — M25562 Pain in left knee: Secondary | ICD-10-CM | POA: Diagnosis not present

## 2017-06-11 DIAGNOSIS — M7122 Synovial cyst of popliteal space [Baker], left knee: Secondary | ICD-10-CM | POA: Diagnosis not present

## 2017-06-20 DIAGNOSIS — J449 Chronic obstructive pulmonary disease, unspecified: Secondary | ICD-10-CM | POA: Diagnosis not present

## 2017-06-29 ENCOUNTER — Ambulatory Visit: Admission: RE | Admit: 2017-06-29 | Discharge: 2017-06-29 | Payer: MEDICARE | Attending: Urology | Admitting: Urology

## 2017-06-29 DIAGNOSIS — R351 Nocturia: Secondary | ICD-10-CM | POA: Diagnosis not present

## 2017-06-29 DIAGNOSIS — R339 Retention of urine, unspecified: Secondary | ICD-10-CM | POA: Diagnosis not present

## 2017-06-29 DIAGNOSIS — N3020 Other chronic cystitis without hematuria: Secondary | ICD-10-CM | POA: Diagnosis not present

## 2017-06-29 MED ORDER — DOXYCYCLINE HYCLATE 100 MG CAPSULE
ORAL_CAPSULE | Freq: Every day | ORAL | 3 refills | 0 days | Status: CP
Start: 2017-06-29 — End: 2019-07-19

## 2017-07-05 DIAGNOSIS — Z79899 Other long term (current) drug therapy: Secondary | ICD-10-CM | POA: Diagnosis not present

## 2017-07-05 DIAGNOSIS — M5416 Radiculopathy, lumbar region: Secondary | ICD-10-CM | POA: Diagnosis not present

## 2017-07-05 DIAGNOSIS — F1120 Opioid dependence, uncomplicated: Secondary | ICD-10-CM | POA: Diagnosis not present

## 2017-07-09 DIAGNOSIS — J449 Chronic obstructive pulmonary disease, unspecified: Secondary | ICD-10-CM | POA: Diagnosis not present

## 2017-07-09 DIAGNOSIS — J438 Other emphysema: Secondary | ICD-10-CM | POA: Diagnosis not present

## 2017-07-13 ENCOUNTER — Ambulatory Visit: Payer: Commercial Managed Care - HMO | Admitting: Family Medicine

## 2017-07-13 ENCOUNTER — Encounter: Payer: Self-pay | Admitting: Family Medicine

## 2017-07-13 ENCOUNTER — Ambulatory Visit (INDEPENDENT_AMBULATORY_CARE_PROVIDER_SITE_OTHER): Payer: Medicare HMO | Admitting: Family Medicine

## 2017-07-13 VITALS — BP 139/55 | HR 71 | Temp 97.6°F | Ht 66.0 in | Wt 214.4 lb

## 2017-07-13 DIAGNOSIS — G44219 Episodic tension-type headache, not intractable: Secondary | ICD-10-CM

## 2017-07-13 DIAGNOSIS — M48061 Spinal stenosis, lumbar region without neurogenic claudication: Secondary | ICD-10-CM | POA: Diagnosis not present

## 2017-07-13 DIAGNOSIS — G6289 Other specified polyneuropathies: Secondary | ICD-10-CM

## 2017-07-13 MED ORDER — NORTRIPTYLINE HCL 10 MG PO CAPS: 10.0000 mg | ORAL_CAPSULE | Freq: Every day | ORAL | 2 refills | Status: DC

## 2017-07-13 NOTE — Progress Notes (Signed)
Subjective:    Patient ID: Megan Bolton, female    DOB: 09-20-1941, 76 y.o.   MRN: [id number]  Megan Bolton is a 76 y.o. female presenting on 07/13/2017 for Numbness (tingling, numbness in feet, intermittent dizziness and headaches  )  Patient presents for a same day appointment. Present with daughter, Annell Greening.  HPI   Frontal Headache Intermittent / Dizziness: - New complaint today with reported bilateral frontal and sometimes unilateral mild headache < 4/10 intermittent episodes over past 1 month or more, also related with some blurry vision episodes, often has had high sugar >200 with some symptoms. Usually headache occurs in evening or late afternoon, sometimes related to other factors but not clear trigger. - Takes Tylenol 500mg  x 2 per dose with relief. Cannot take NSAIDs due to [name] - Limited caffeine, only coffee x 1 daily in AM - No prior history of migraine or complicated headaches - Denies loss of vision, numbness, weakness tingling, nausea, vomiting  Chronic Neuropathy, Bilateral Lower Extremity - Reports chronic problem for years, uncertain duration, bilateral lower leg stocking distribution and feet, thought secondary to lumbar spinal stenosis and radiculopathy, also possible with type 2 diabetes related neuropathy. - Followed by Pain Management (Dr Nathanial Rancher, [facility] in [place]), provided Oxycontin 10mg  BID and Gabapentin 800mg  TID for management with mixed results - Last seen by NP at our office on 05/18/17, for same problem, she added Amitriptyline 10mg  nightly for neuropathic pain, however patient was unable to tolerate this only after 1 dose, she self discontinued due to drowsiness but it did help pain, she was advised to take half tablet, but did not appear to adhere to this either - Has received acupuncture in past as well - Today asking about treatment for her chronic neuropathy, which has not changed and is not urgent same day issue. - Denies leg  swelling, redness, injury, fall, ulceration   Social History  Substance Use Topics  . Smoking status: Former Smoker    Packs/day: 0.50    Quit date: 04/28/2015  . Smokeless tobacco: Never Used  . Alcohol use No    Review of Systems Per HPI unless specifically indicated above     Objective:    BP (!) 139/55 (BP Location: Right Arm, Patient Position: Sitting, Cuff Size: Normal)   Pulse 71   Temp 97.6 F (36.4 C) (Oral)   Ht 5\' 6"  (1.676 m)   Wt 214 lb 6.4 oz (97.3 kg)   SpO2 98%   BMI 34.61 kg/m   Wt Readings from Last 3 Encounters:  07/13/17 214 lb 6.4 oz (97.3 kg)  06/03/17 221 lb (100.2 kg)  05/19/17 221 lb (100.2 kg)    Physical Exam  Constitutional: She is oriented to person, place, and time. She appears well-developed and well-nourished. No distress.  Well-appearing, comfortable, cooperative  HENT:  Head: Normocephalic and atraumatic.  Mouth/Throat: Oropharynx is clear and moist.  Frontal / maxillary sinuses non-tender. Nares patent without purulence or edema. Bilateral TMs clear without erythema, effusion or bulging. Oropharynx clear without erythema, exudates, edema or asymmetry.  Eyes: Pupils are equal, round, and reactive to light. Conjunctivae and EOM are normal. Right eye exhibits no discharge. Left eye exhibits no discharge.  Neck: Normal range of motion. Neck supple. No thyromegaly present.  Cardiovascular: Normal rate, regular rhythm, normal heart sounds and intact distal pulses.   No murmur heard. Pulmonary/Chest: Effort normal.  Musculoskeletal: Normal range of motion. She exhibits no edema.  Distal muscle strength intact  5/5 upper and lower ext  Lymphadenopathy:    She has no cervical adenopathy.  Neurological: She is alert and oriented to person, place, and time. No cranial nerve deficit.  Distal sensation to light touch reduced bilateral feet dorsal and plantar surfaces, at baseline  Skin: Skin is warm and dry. No rash noted. She is not diaphoretic.  No erythema.  Psychiatric: She has a normal mood and affect. Her behavior is normal.  Well groomed, good eye contact, normal speech and thoughts  Nursing note and vitals reviewed.      Assessment & Plan:   Problem List Items Addressed This Visit    Peripheral neuropathy - Primary    Stable chronic bilateral stocking LE neuropathy, with reduced sensation and pain In setting of DM and lumbar spinal stenosis history of radiculopathy, multifactorial Followed by Pain Management (Dr Nathanial Rancher, [place]) - On Oxycontin  Plan 1. Discussed management of peripheral neuropathy, limited options now due to co-morbidities, age, side effects, and existing treatment 2. Counseled on inc dose Gabapentin, max safe dose is 3600 in 24 hours - currently at 2400 in 24 hour tolerating well without sedation 3. Increase Gabapentin up by half tab for dosing 4 times day now instead of 3, [id number], then eventually 1-2 weeks increase to 800mg  4 times daily for dose 3200mg  in 24 hours 4. Also discussed may trial existing Amitriptyline 10mg , but cut tabs in half, unsure if she actually tried this before or not, limit side effects, counseling, if cannot tolerate, STOP, then may switch to new trial on Nortriptyline, printed rx only fill if need 5. Follow-up with pain management, may need switch to Lyrica in future vs possible referral to Neurology for EMG or other eval      Relevant Medications   nortriptyline (PAMELOR) 10 MG capsule   Lumbar canal stenosis    Likely contributing factor to neuropathy in past Seems less likely now, with more stocking distribution Followed by Pain Management Treat with inc Gabapentin, low dose TCA      Relevant Medications   nortriptyline (PAMELOR) 10 MG capsule    Other Visit Diagnoses    Episodic tension-type headache, not intractable  Suspected tension type based on history and resolution with Tylenol, no other concerning features or other  factors for other HA at this time. Also may be with episodic hyperglycemia, may be med side effect polypharmacy, could be oxygen related as well. - Reassurance no focal neuro or other concern - Continue Tylenol PRN more regular dosing - Inc Gabapentin for neuropathy, add low TCA may help - Improve hydration and nutrition, monitor CBGs - Return criteria given if not improved        Relevant Medications   nortriptyline (PAMELOR) 10 MG capsule      Meds ordered this encounter  Medications  . nortriptyline (PAMELOR) 10 MG capsule    Sig: Take 1 capsule (10 mg total) by mouth at bedtime.    Dispense:  30 capsule    Refill:  2    Follow up plan: Return in about 6 weeks (around 08/24/2017) for Neuropathy.  Nobie Putnam, DO [facility] 07/14/2017, 1:48 AM

## 2017-07-13 NOTE — Patient Instructions (Addendum)
Thank you for coming to the clinic today.  1.  Headaches most likely tension headaches, and from nerve pain in feet - Try to keep track of Blood Pressure and blood sugar is >200 can cause blurry vision  Recommend to start taking Tylenol Extra Strength 500mg  tabs - take 1 to 2 tabs per dose (max 1000mg ) every 6-8 hours for pain (take regularly, don't skip a dose for next 7 days), max 24 hour daily dose is 6 tablets or 3000mg . In the future you can repeat the same everyday Tylenol course for 1-2 weeks at a time.   If you get worsening symptoms headache, loss of vision, numbness or tingling or can't use face or arm, then seek more immediate treatment as hospital  Maybe tension headaches coming from pain  2. For Neuropathy in feet  To start leave gabapentin alone - continue 800mg  3 times a day (2400mg ) - First change is take HALF pill of the previous med Amitriptyline at bedtime for few days to week  Step 2, is going to be increase Gabapentin to ADD ONE HALF PILL during the day, need to take 1 whole pill 3 times a day and then add the half pill in between  For 4 time daily dosing on gabapentin = one dose every 5-6 hours, 800 - 800 - 400 - 800 (2800mg )  After 1-2 weeks if tolerate gabapentin well, can increase to 4 WHOLE pills a day - spaced out over 4 doses (3200mg )  ----- Lastly, printed new rx Nortriptyline 10mg  capsules, take one at bedtime, IF NOT improved on previous med Amitriptyline or not improved with higher gabapentin. Can cause similar side effects.  Please schedule a Follow-up Appointment to: Return in about 6 weeks (around 08/24/2017) for Neuropathy.  If you have any other questions or concerns, please feel free to call the clinic or send a message through [place]. You may also schedule an earlier appointment if necessary.  Additionally, you may be receiving a survey about your experience at our clinic within a few days to 1 week by e-mail or mail. We value your  feedback.  Nobie Putnam, DO [place]

## 2017-07-14 ENCOUNTER — Ambulatory Visit: Payer: Commercial Managed Care - HMO | Admitting: Family Medicine

## 2017-07-14 DIAGNOSIS — G629 Polyneuropathy, unspecified: Secondary | ICD-10-CM | POA: Insufficient documentation

## 2017-07-14 NOTE — Assessment & Plan Note (Signed)
Likely contributing factor to neuropathy in past Seems less likely now, with more stocking distribution Followed by Pain Management Treat with inc Gabapentin, low dose TCA

## 2017-07-14 NOTE — Assessment & Plan Note (Signed)
Stable chronic bilateral stocking LE neuropathy, with reduced sensation and pain In setting of DM and lumbar spinal stenosis history of radiculopathy, multifactorial Followed by Pain Management (Dr Nathanial Rancher, [place]) - On Oxycontin  Plan 1. Discussed management of peripheral neuropathy, limited options now due to co-morbidities, age, side effects, and existing treatment 2. Counseled on inc dose Gabapentin, max safe dose is 3600 in 24 hours - currently at 2400 in 24 hour tolerating well without sedation 3. Increase Gabapentin up by half tab for dosing 4 times day now instead of 3, [id number], then eventually 1-2 weeks increase to 800mg  4 times daily for dose 3200mg  in 24 hours 4. Also discussed may trial existing Amitriptyline 10mg , but cut tabs in half, unsure if she actually tried this before or not, limit side effects, counseling, if cannot tolerate, STOP, then may switch to new trial on Nortriptyline, printed rx only fill if need 5. Follow-up with pain management, may need switch to Lyrica in future vs possible referral to Neurology for EMG or other eval

## 2017-07-20 DIAGNOSIS — J449 Chronic obstructive pulmonary disease, unspecified: Secondary | ICD-10-CM | POA: Diagnosis not present

## 2017-07-26 DIAGNOSIS — N390 Urinary tract infection, site not specified: Secondary | ICD-10-CM | POA: Diagnosis not present

## 2017-07-26 DIAGNOSIS — E1122 Type 2 diabetes mellitus with diabetic chronic kidney disease: Secondary | ICD-10-CM | POA: Diagnosis not present

## 2017-07-26 DIAGNOSIS — I129 Hypertensive chronic kidney disease with stage 1 through stage 4 chronic kidney disease, or unspecified chronic kidney disease: Secondary | ICD-10-CM | POA: Diagnosis not present

## 2017-07-26 DIAGNOSIS — N183 Chronic kidney disease, stage 3 (moderate): Secondary | ICD-10-CM | POA: Diagnosis not present

## 2017-07-28 DIAGNOSIS — H5203 Hypermetropia, bilateral: Secondary | ICD-10-CM | POA: Diagnosis not present

## 2017-07-28 LAB — HM DIABETES EYE EXAM

## 2017-08-03 ENCOUNTER — Ambulatory Visit: Payer: Medicare HMO | Admitting: Family Medicine

## 2017-08-09 DIAGNOSIS — J438 Other emphysema: Secondary | ICD-10-CM | POA: Diagnosis not present

## 2017-08-09 DIAGNOSIS — J449 Chronic obstructive pulmonary disease, unspecified: Secondary | ICD-10-CM | POA: Diagnosis not present

## 2017-08-13 ENCOUNTER — Ambulatory Visit (INDEPENDENT_AMBULATORY_CARE_PROVIDER_SITE_OTHER): Payer: Medicare HMO | Admitting: Family Medicine

## 2017-08-13 ENCOUNTER — Encounter: Payer: Self-pay | Admitting: Family Medicine

## 2017-08-13 VITALS — BP 116/59 | HR 56 | Temp 97.9°F | Resp 18 | Ht 66.0 in | Wt 216.4 lb

## 2017-08-13 DIAGNOSIS — F4323 Adjustment disorder with mixed anxiety and depressed mood: Secondary | ICD-10-CM

## 2017-08-13 DIAGNOSIS — G6289 Other specified polyneuropathies: Secondary | ICD-10-CM

## 2017-08-13 MED ORDER — BACLOFEN 10 MG PO TABS: 5.0000 mg | ORAL_TABLET | Freq: Three times a day (TID) | ORAL | 1 refills | Status: DC | PRN

## 2017-08-13 MED ORDER — DULOXETINE HCL 30 MG PO CPEP: 30.0000 mg | ORAL_CAPSULE | Freq: Every day | ORAL | 3 refills | Status: DC

## 2017-08-13 NOTE — Assessment & Plan Note (Signed)
Stable unchanged chronic bilateral stocking LE neuropathy, with reduced sensation and pain In setting of DM and lumbar spinal stenosis history of radiculopathy, multifactorial Followed by Pain Management (Dr Nathanial Rancher, [place]) - On Oxycontin, Clonazepam - Failed: Gabapentin (still on but ineffective), Lyrica, Robaxin, Tizanidine, Amitriptyline, Nortriptyline  Plan 1. Discussed management of peripheral neuropathy, limited options now due to co-morbidities, age, side effects, and existing treatment 2. Reduce dose Gabapentin back to prior dose 3. Stop Nortriptyline 4. Start new SNRI Duloxetine 30mg  daily - sent rx to pharmacy, may need step therapy with Venlafaxine first. Counseled on potential risk/side effect, goal for helping some adjustment mood/anxiety and nerve pain. 5. Start new muscle relaxant Baclofen 5-10mg  TID PRN, caution sedation 6. Follow-up 6 weeks if not improved consider Requip possible RLS, alternatively next step is Neurology referral with [facility]

## 2017-08-13 NOTE — Patient Instructions (Addendum)
Thank you for coming to the clinic today.  1.  Start taking Baclofen (Lioresal) 10mg  (muscle relaxant) - start with half (cut) to one whole pill at night as needed for next 1-3 nights (may make you drowsy, caution with driving) see how it affects you, then if tolerated increase to one pill 2 to 3 times a day or (every 8 hours as needed)  Start Duloxetine 30mg  daily for mood / anxiety / and nerve pain muscle symptoms in lower legs - May take 4-6 weeks for full effect  You can still take Clonazepam as needed from your Pain Management doctor  2. In future last resort will be Requip for restless leg syndrome if needed  Consider Alpha Lipoic Acid, Vitamin B6, Ginger  Please schedule a Follow-up Appointment to: Return in about 6 weeks (around 09/24/2017) for DM A1c, Neuropathy med adjust, (?RLS).  If you have any other questions or concerns, please feel free to call the clinic or send a message through [place]. You may also schedule an earlier appointment if necessary.  Additionally, you may be receiving a survey about your experience at our clinic within a few days to 1 week by e-mail or mail. We value your feedback.  Nobie Putnam, DO [place]

## 2017-08-13 NOTE — Progress Notes (Signed)
Subjective:    Patient ID: Megan Bolton, female    DOB: February 12, 1941, 76 y.o.   MRN: [id number]  Megan Bolton is a 76 y.o. female presenting on 08/13/2017 for Hypertension; Diabetes (neuropathy ); and Anxiety  Present with daughter, Annell Greening.  HPI   Chronic Neuropathy, Bilateral Lower Extremity - Last visit with me 07/13/17, for same problem, treated with adjusting gabapentin dose up and adding 4th dose, up to max of 3200mg  in 24 hours, and also tried new Nortriptyline 10mg  (had failed prior Amitriptyline), see prior notes for background information. - Interval update with she tried both interventions and was unsuccessful, without any improvement. Nortriptyline was ineffective and she stopped this after 1 week. Gabapentin had some difficulty tolerating but ultimately was ineffective. - Today patient reports concern with persistent lower extremity symptoms, describes more this time as a "pulling" or tightening in lower legs, along with burning and tingling numbness - She does admit to taking Lyrica in past without good results as well - Has been on at least one muscle relaxant, thinks Robaxin years ago unsure if helped, also on Tizanidine per chart, interested to try another - Never diagnosed with RLS or on requip before - Takes Clonazepam 0.5mg  nightly with some relief, along with Oxycontin prescribed by Pain Management Dr Lisa Roca, which is primarily for history of Lumbar Spinal Stenosis likely contributing - Had some improvement with Blue Emu topical with lidocaine on lower extremities, also does Epsom salt soaks - Has not seen Neurology or had nerve conduction studies - Admits some occasional changes due to some mood and anxiety, usually related to her actual medical problems - Denies leg swelling, redness, injury, fall, ulceration  Depression screen [facility] 2/9 08/13/2017 08/25/2016 07/27/2016  Decreased Interest 0 0 0  Down, Depressed, Hopeless 1 0 0  PHQ - 2 Score 1 0 0  Altered  sleeping 1 - -  Tired, decreased energy 3 - -  Change in appetite 3 - -  Feeling bad or failure about yourself  0 - -  Trouble concentrating 0 - -  Moving slowly or fidgety/restless 0 - -  Suicidal thoughts 0 - -  PHQ-9 Score 8 - -  Difficult doing work/chores Somewhat difficult - -    Social History  Substance Use Topics  . Smoking status: Former Smoker    Packs/day: 0.50    Quit date: 04/28/2015  . Smokeless tobacco: Never Used  . Alcohol use No    Review of Systems Per HPI unless specifically indicated above     Objective:    BP (!) 116/59 (BP Location: Right Arm, Patient Position: Sitting, Cuff Size: Normal)   Pulse (!) 56   Temp 97.9 F (36.6 C) (Oral)   Resp 18   Ht 5\' 6"  (1.676 m)   Wt 216 lb 6.4 oz (98.2 kg)   SpO2 97%   BMI 34.93 kg/m   Wt Readings from Last 3 Encounters:  08/13/17 216 lb 6.4 oz (98.2 kg)  07/13/17 214 lb 6.4 oz (97.3 kg)  06/03/17 221 lb (100.2 kg)    Physical Exam  Constitutional: She is oriented to person, place, and time. She appears well-developed and well-nourished. No distress.  Well-appearing, comfortable, cooperative  HENT:  Head: Normocephalic and atraumatic.  Mouth/Throat: Oropharynx is clear and moist.  Eyes: Conjunctivae are normal.  Neck: Normal range of motion. Neck supple.  Cardiovascular: Normal rate, regular rhythm, normal heart sounds and intact distal pulses.   No murmur heard. Pulmonary/Chest: Effort normal.  Portable O2  Musculoskeletal: Normal range of motion. She exhibits no edema.  Distal muscle strength intact 5/5 upper and lower ext  Neurological: She is alert and oriented to person, place, and time. No cranial nerve deficit.  Distal sensation to light touch reduced bilateral feet dorsal and plantar surfaces, at baseline  Skin: Skin is warm and dry. No rash noted. She is not diaphoretic. No erythema.  Psychiatric: She has a normal mood and affect. Her behavior is normal.  Well groomed, good eye contact,  normal speech and thoughts  Nursing note and vitals reviewed.      Assessment & Plan:   Problem List Items Addressed This Visit    Peripheral neuropathy - Primary    Stable unchanged chronic bilateral stocking LE neuropathy, with reduced sensation and pain In setting of DM and lumbar spinal stenosis history of radiculopathy, multifactorial Followed by Pain Management (Dr Nathanial Rancher, [place]) - On Oxycontin, Clonazepam - Failed: Gabapentin (still on but ineffective), Lyrica, Robaxin, Tizanidine, Amitriptyline, Nortriptyline  Plan 1. Discussed management of peripheral neuropathy, limited options now due to co-morbidities, age, side effects, and existing treatment 2. Reduce dose Gabapentin back to prior dose 3. Stop Nortriptyline 4. Start new SNRI Duloxetine 30mg  daily - sent rx to pharmacy, may need step therapy with Venlafaxine first. Counseled on potential risk/side effect, goal for helping some adjustment mood/anxiety and nerve pain. 5. Start new muscle relaxant Baclofen 5-10mg  TID PRN, caution sedation 6. Follow-up 6 weeks if not improved consider Requip possible RLS, alternatively next step is Neurology referral with [facility]      Relevant Medications   baclofen (LIORESAL) 10 MG tablet   DULoxetine (CYMBALTA) 30 MG capsule   Adjustment disorder with mixed anxiety and depressed mood    Mild subacute on chronic adjustment disorder with some mild depressive mood symptoms and anxiety, likely multifactorial with chronic illnesses Seems to function well, has good and bad days See PHQ No prior history psych / SSRI or other meds  Plan: 1. Start new SNRI Duloxetine 30mg  daily - sent rx to pharmacy, may need step therapy with Venlafaxine first. Counseled on potential risk/side effect, goal for helping some adjustment mood/anxiety and nerve pain 2. F/u 6 weeks      Relevant Medications   DULoxetine (CYMBALTA) 30 MG capsule        Meds ordered this encounter    Medications  . irbesartan-hydrochlorothiazide (AVALIDE) 300-12.5 MG tablet  . hydrALAZINE (APRESOLINE) 50 MG tablet  . baclofen (LIORESAL) 10 MG tablet    Sig: Take 0.5-1 tablets (5-10 mg total) by mouth 3 (three) times daily as needed for muscle spasms.    Dispense:  30 each    Refill:  1  . DULoxetine (CYMBALTA) 30 MG capsule    Sig: Take 1 capsule (30 mg total) by mouth daily.    Dispense:  30 capsule    Refill:  3    Follow up plan: Return in about 6 weeks (around 09/24/2017) for DM A1c, Neuropathy med adjust, (?RLS).  Nobie Putnam, [facility] 08/13/2017, 12:53 PM

## 2017-08-13 NOTE — Assessment & Plan Note (Addendum)
Mild subacute on chronic adjustment disorder with some mild depressive mood symptoms and anxiety, likely multifactorial with chronic illnesses Seems to function well, has good and bad days See PHQ No prior history psych / SSRI or other meds  Plan: 1. Start new SNRI Duloxetine 30mg  daily - sent rx to pharmacy, may need step therapy with Venlafaxine first. Counseled on potential risk/side effect, goal for helping some adjustment mood/anxiety and nerve pain 2. F/u 6 weeks

## 2017-08-16 ENCOUNTER — Ambulatory Visit: Payer: Medicare HMO | Admitting: Family Medicine

## 2017-08-20 DIAGNOSIS — J449 Chronic obstructive pulmonary disease, unspecified: Secondary | ICD-10-CM | POA: Diagnosis not present

## 2017-08-27 ENCOUNTER — Other Ambulatory Visit: Payer: Self-pay

## 2017-08-27 MED ORDER — TIOTROPIUM BROMIDE MONOHYDRATE 18 MCG IN CAPS: 18.0000 ug | ORAL_CAPSULE | Freq: Every day | RESPIRATORY_TRACT | 6 refills | Status: AC

## 2017-08-27 MED ORDER — ACCU-CHEK AVIVA PLUS VI STRP: 1.0000 | ORAL_STRIP | Freq: Two times a day (BID) | 11 refills | Status: DC

## 2017-08-30 ENCOUNTER — Encounter: Payer: Self-pay | Admitting: Emergency Medicine

## 2017-08-30 ENCOUNTER — Observation Stay
Admission: EM | Admit: 2017-08-30 | Discharge: 2017-08-31 | Disposition: A | Discharge status: Home / Self Care | Payer: Medicare HMO | Attending: Internal Medicine | Admitting: Internal Medicine

## 2017-08-30 ENCOUNTER — Emergency Department: Payer: Medicare HMO

## 2017-08-30 DIAGNOSIS — I110 Hypertensive heart disease with heart failure: Secondary | ICD-10-CM | POA: Diagnosis not present

## 2017-08-30 DIAGNOSIS — Z7982 Long term (current) use of aspirin: Secondary | ICD-10-CM | POA: Diagnosis not present

## 2017-08-30 DIAGNOSIS — R0602 Shortness of breath: Secondary | ICD-10-CM | POA: Diagnosis not present

## 2017-08-30 DIAGNOSIS — J9621 Acute and chronic respiratory failure with hypoxia: Principal | ICD-10-CM | POA: Insufficient documentation

## 2017-08-30 DIAGNOSIS — E785 Hyperlipidemia, unspecified: Secondary | ICD-10-CM | POA: Diagnosis not present

## 2017-08-30 DIAGNOSIS — E119 Type 2 diabetes mellitus without complications: Secondary | ICD-10-CM | POA: Diagnosis not present

## 2017-08-30 DIAGNOSIS — I509 Heart failure, unspecified: Secondary | ICD-10-CM | POA: Diagnosis not present

## 2017-08-30 DIAGNOSIS — Z87891 Personal history of nicotine dependence: Secondary | ICD-10-CM | POA: Insufficient documentation

## 2017-08-30 DIAGNOSIS — Z79899 Other long term (current) drug therapy: Secondary | ICD-10-CM | POA: Insufficient documentation

## 2017-08-30 DIAGNOSIS — E118 Type 2 diabetes mellitus with unspecified complications: Secondary | ICD-10-CM | POA: Diagnosis not present

## 2017-08-30 DIAGNOSIS — Z809 Family history of malignant neoplasm, unspecified: Secondary | ICD-10-CM | POA: Insufficient documentation

## 2017-08-30 DIAGNOSIS — Z90710 Acquired absence of both cervix and uterus: Secondary | ICD-10-CM | POA: Insufficient documentation

## 2017-08-30 DIAGNOSIS — I5033 Acute on chronic diastolic (congestive) heart failure: Secondary | ICD-10-CM | POA: Diagnosis not present

## 2017-08-30 DIAGNOSIS — Z8249 Family history of ischemic heart disease and other diseases of the circulatory system: Secondary | ICD-10-CM | POA: Diagnosis not present

## 2017-08-30 DIAGNOSIS — J189 Pneumonia, unspecified organism: Secondary | ICD-10-CM | POA: Insufficient documentation

## 2017-08-30 DIAGNOSIS — Z7984 Long term (current) use of oral hypoglycemic drugs: Secondary | ICD-10-CM | POA: Diagnosis not present

## 2017-08-30 DIAGNOSIS — J810 Acute pulmonary edema: Secondary | ICD-10-CM

## 2017-08-30 DIAGNOSIS — Z888 Allergy status to other drugs, medicaments and biological substances status: Secondary | ICD-10-CM | POA: Insufficient documentation

## 2017-08-30 DIAGNOSIS — Z9981 Dependence on supplemental oxygen: Secondary | ICD-10-CM | POA: Insufficient documentation

## 2017-08-30 DIAGNOSIS — J449 Chronic obstructive pulmonary disease, unspecified: Secondary | ICD-10-CM | POA: Insufficient documentation

## 2017-08-30 HISTORY — DX: Unspecified diastolic (congestive) heart failure: I50.30

## 2017-08-30 LAB — CBC
HCT: 33.6 % — ABNORMAL LOW (ref 35.0–47.0)
HEMOGLOBIN: 10.8 g/dL — AB (ref 12.0–16.0)
MCH: 27.5 pg (ref 26.0–34.0)
MCHC: 32.1 g/dL (ref 32.0–36.0)
MCV: 85.8 fL (ref 80.0–100.0)
Platelets: 182 10*3/uL (ref 150–440)
RBC: 3.92 MIL/uL (ref 3.80–5.20)
RDW: 13.5 % (ref 11.5–14.5)
WBC: 11.5 10*3/uL — ABNORMAL HIGH (ref 3.6–11.0)

## 2017-08-30 LAB — BASIC METABOLIC PANEL
ANION GAP: 7 (ref 5–15)
BUN: 26 mg/dL — AB (ref 6–20)
CHLORIDE: 101 mmol/L (ref 101–111)
CO2: 31 mmol/L (ref 22–32)
Calcium: 9.1 mg/dL (ref 8.9–10.3)
Creatinine, Ser: 0.94 mg/dL (ref 0.44–1.00)
GFR calc Af Amer: >60 mL/min (ref >60)
GFR calc non Af Amer: 58 mL/min — ABNORMAL LOW (ref >60)
Glucose, Bld: 245 mg/dL — ABNORMAL HIGH (ref 65–99)
POTASSIUM: 4.1 mmol/L (ref 3.5–5.1)
Sodium: 139 mmol/L (ref 135–145)

## 2017-08-30 LAB — GLUCOSE, CAPILLARY
GLUCOSE-CAPILLARY: 230 mg/dL — AB (ref 65–99)
GLUCOSE-CAPILLARY: 313 mg/dL — AB (ref 65–99)
Glucose-Capillary: 322 mg/dL — ABNORMAL HIGH (ref 65–99)
Glucose-Capillary: 260 mg/dL — ABNORMAL HIGH (ref 65–99)

## 2017-08-30 LAB — BRAIN NATRIURETIC PEPTIDE: B NATRIURETIC PEPTIDE 5: 414 pg/mL — AB (ref 0.0–100.0)

## 2017-08-30 LAB — TROPONIN I: Troponin I: 0.03 ng/mL (ref <0.03)

## 2017-08-30 MED ORDER — HYDROCODONE-ACETAMINOPHEN 5-325 MG PO TABS: 1.0000 | ORAL_TABLET | ORAL | Status: DC | PRN

## 2017-08-30 MED ORDER — ORAL CARE MOUTH RINSE: 15.0000 mL | Freq: Two times a day (BID) | OROMUCOSAL | Status: DC

## 2017-08-30 MED ORDER — ALBUTEROL SULFATE (2.5 MG/3ML) 0.083% IN NEBU: 2.5000 mg | INHALATION_SOLUTION | Freq: Four times a day (QID) | RESPIRATORY_TRACT | Status: DC | PRN

## 2017-08-30 MED ORDER — LEVOFLOXACIN IN D5W 500 MG/100ML IV SOLN
500.0000 mg | INTRAVENOUS | Status: DC
  Administered 2017-08-31: 500 mg via INTRAVENOUS
  Filled 2017-08-30: qty 100

## 2017-08-30 MED ORDER — OXYCODONE HCL ER 10 MG PO T12A
10.0000 mg | EXTENDED_RELEASE_TABLET | Freq: Two times a day (BID) | ORAL | Status: DC
  Administered 2017-08-30 – 2017-08-31 (×3): 10 mg via ORAL
  Filled 2017-08-30 (×3): qty 1

## 2017-08-30 MED ORDER — LEVOFLOXACIN IN D5W 750 MG/150ML IV SOLN
750.0000 mg | Freq: Once | INTRAVENOUS | Status: AC
  Administered 2017-08-30: 750 mg via INTRAVENOUS
  Filled 2017-08-30: qty 150

## 2017-08-30 MED ORDER — ACETAMINOPHEN 325 MG PO TABS
650.0000 mg | ORAL_TABLET | Freq: Four times a day (QID) | ORAL | Status: DC | PRN
  Administered 2017-08-30: 650 mg via ORAL
  Filled 2017-08-30: qty 2

## 2017-08-30 MED ORDER — FUROSEMIDE 10 MG/ML IJ SOLN
60.0000 mg | Freq: Once | INTRAMUSCULAR | Status: AC
  Administered 2017-08-30: 60 mg via INTRAVENOUS
  Filled 2017-08-30: qty 8

## 2017-08-30 MED ORDER — HYDROCHLOROTHIAZIDE 12.5 MG PO CAPS
12.5000 mg | ORAL_CAPSULE | Freq: Every day | ORAL | Status: DC
  Administered 2017-08-30 – 2017-08-31 (×2): 12.5 mg via ORAL
  Filled 2017-08-30 (×2): qty 1

## 2017-08-30 MED ORDER — SODIUM CHLORIDE 0.9% FLUSH: 3.0000 mL | INTRAVENOUS | Status: DC | PRN

## 2017-08-30 MED ORDER — SODIUM CHLORIDE 0.9 % IV SOLN: 250.0000 mL | INTRAVENOUS | Status: DC | PRN

## 2017-08-30 MED ORDER — GABAPENTIN 400 MG PO CAPS
800.0000 mg | ORAL_CAPSULE | Freq: Three times a day (TID) | ORAL | Status: DC
  Administered 2017-08-30 – 2017-08-31 (×4): 800 mg via ORAL
  Filled 2017-08-30 (×4): qty 2

## 2017-08-30 MED ORDER — ACETAMINOPHEN 650 MG RE SUPP: 650.0000 mg | Freq: Four times a day (QID) | RECTAL | Status: DC | PRN

## 2017-08-30 MED ORDER — SENNOSIDES-DOCUSATE SODIUM 8.6-50 MG PO TABS: 1.0000 | ORAL_TABLET | Freq: Every evening | ORAL | Status: DC | PRN

## 2017-08-30 MED ORDER — IPRATROPIUM-ALBUTEROL 0.5-2.5 (3) MG/3ML IN SOLN
3.0000 mL | Freq: Once | RESPIRATORY_TRACT | Status: AC
  Administered 2017-08-30: 3 mL via RESPIRATORY_TRACT
  Filled 2017-08-30: qty 9

## 2017-08-30 MED ORDER — IPRATROPIUM-ALBUTEROL 0.5-2.5 (3) MG/3ML IN SOLN
3.0000 mL | Freq: Once | RESPIRATORY_TRACT | Status: AC
  Administered 2017-08-30: 3 mL via RESPIRATORY_TRACT

## 2017-08-30 MED ORDER — CARVEDILOL 25 MG PO TABS
25.0000 mg | ORAL_TABLET | Freq: Two times a day (BID) | ORAL | Status: DC
  Administered 2017-08-30 – 2017-08-31 (×3): 25 mg via ORAL
  Filled 2017-08-30 (×3): qty 1

## 2017-08-30 MED ORDER — FUROSEMIDE 10 MG/ML IJ SOLN
40.0000 mg | Freq: Every day | INTRAMUSCULAR | Status: DC
  Administered 2017-08-31: 40 mg via INTRAVENOUS
  Filled 2017-08-30: qty 4

## 2017-08-30 MED ORDER — ASPIRIN EC 325 MG PO TBEC
325.0000 mg | DELAYED_RELEASE_TABLET | Freq: Every day | ORAL | Status: DC
  Administered 2017-08-30 – 2017-08-31 (×2): 325 mg via ORAL
  Filled 2017-08-30 (×2): qty 1

## 2017-08-30 MED ORDER — CLONIDINE HCL 0.1 MG PO TABS
0.1000 mg | ORAL_TABLET | Freq: Three times a day (TID) | ORAL | Status: DC
  Administered 2017-08-30 – 2017-08-31 (×4): 0.1 mg via ORAL
  Filled 2017-08-30 (×4): qty 1

## 2017-08-30 MED ORDER — INSULIN ASPART 100 UNIT/ML ~~LOC~~ SOLN
0.0000 [IU] | Freq: Three times a day (TID) | SUBCUTANEOUS | Status: DC
  Administered 2017-08-30: 3 [IU] via SUBCUTANEOUS
  Administered 2017-08-30: 7 [IU] via SUBCUTANEOUS
  Filled 2017-08-30 (×2): qty 1

## 2017-08-30 MED ORDER — ENOXAPARIN SODIUM 40 MG/0.4ML ~~LOC~~ SOLN
40.0000 mg | SUBCUTANEOUS | Status: DC
  Administered 2017-08-30: 40 mg via SUBCUTANEOUS
  Filled 2017-08-30 (×2): qty 0.4

## 2017-08-30 MED ORDER — INSULIN ASPART 100 UNIT/ML ~~LOC~~ SOLN
0.0000 [IU] | Freq: Three times a day (TID) | SUBCUTANEOUS | Status: DC
  Administered 2017-08-30: 5 [IU] via SUBCUTANEOUS
  Administered 2017-08-31: 2 [IU] via SUBCUTANEOUS
  Filled 2017-08-30 (×2): qty 1

## 2017-08-30 MED ORDER — MOMETASONE FURO-FORMOTEROL FUM 200-5 MCG/ACT IN AERO
2.0000 | INHALATION_SPRAY | Freq: Two times a day (BID) | RESPIRATORY_TRACT | Status: DC
  Filled 2017-08-30: qty 8.8

## 2017-08-30 MED ORDER — TIOTROPIUM BROMIDE MONOHYDRATE 18 MCG IN CAPS
18.0000 ug | ORAL_CAPSULE | Freq: Every day | RESPIRATORY_TRACT | Status: DC
  Administered 2017-08-30 – 2017-08-31 (×2): 18 ug via RESPIRATORY_TRACT
  Filled 2017-08-30: qty 5

## 2017-08-30 MED ORDER — NORTRIPTYLINE HCL 10 MG PO CAPS
10.0000 mg | ORAL_CAPSULE | Freq: Every day | ORAL | Status: DC
  Administered 2017-08-30: 10 mg via ORAL
  Filled 2017-08-30: qty 1

## 2017-08-30 MED ORDER — ONDANSETRON HCL 4 MG/2ML IJ SOLN: 4.0000 mg | Freq: Four times a day (QID) | INTRAMUSCULAR | Status: DC | PRN

## 2017-08-30 MED ORDER — POTASSIUM CHLORIDE CRYS ER 10 MEQ PO TBCR
10.0000 meq | EXTENDED_RELEASE_TABLET | Freq: Two times a day (BID) | ORAL | Status: DC
  Administered 2017-08-30 – 2017-08-31 (×3): 10 meq via ORAL
  Filled 2017-08-30 (×4): qty 1

## 2017-08-30 MED ORDER — ONDANSETRON HCL 4 MG PO TABS: 4.0000 mg | ORAL_TABLET | Freq: Four times a day (QID) | ORAL | Status: DC | PRN

## 2017-08-30 MED ORDER — HYDRALAZINE HCL 50 MG PO TABS
50.0000 mg | ORAL_TABLET | Freq: Three times a day (TID) | ORAL | Status: DC
Start: 2017-08-30 — End: 2017-08-31
  Administered 2017-08-30 – 2017-08-31 (×4): 50 mg via ORAL
  Filled 2017-08-30 (×4): qty 1

## 2017-08-30 MED ORDER — ALBUTEROL SULFATE HFA 108 (90 BASE) MCG/ACT IN AERS: 2.0000 | INHALATION_SPRAY | Freq: Four times a day (QID) | RESPIRATORY_TRACT | Status: DC | PRN

## 2017-08-30 MED ORDER — GABAPENTIN 800 MG PO TABS
800.0000 mg | ORAL_TABLET | Freq: Three times a day (TID) | ORAL | Status: DC
  Filled 2017-08-30: qty 1

## 2017-08-30 MED ORDER — IRBESARTAN-HYDROCHLOROTHIAZIDE 300-12.5 MG PO TABS: 1.0000 | ORAL_TABLET | Freq: Every day | ORAL | Status: DC

## 2017-08-30 MED ORDER — SODIUM CHLORIDE 0.9% FLUSH
3.0000 mL | Freq: Two times a day (BID) | INTRAVENOUS | Status: DC
  Administered 2017-08-30 – 2017-08-31 (×3): 3 mL via INTRAVENOUS

## 2017-08-30 MED ORDER — METHYLPREDNISOLONE SODIUM SUCC 125 MG IJ SOLR
125.0000 mg | Freq: Once | INTRAMUSCULAR | Status: AC
  Administered 2017-08-30: 125 mg via INTRAVENOUS
  Filled 2017-08-30: qty 2

## 2017-08-30 MED ORDER — IRBESARTAN 150 MG PO TABS
300.0000 mg | ORAL_TABLET | Freq: Every day | ORAL | Status: DC
  Administered 2017-08-30 – 2017-08-31 (×2): 300 mg via ORAL
  Filled 2017-08-30 (×2): qty 2

## 2017-08-30 MED ORDER — ALBUTEROL SULFATE (2.5 MG/3ML) 0.083% IN NEBU
2.5000 mg | INHALATION_SOLUTION | Freq: Four times a day (QID) | RESPIRATORY_TRACT | Status: DC | PRN
  Administered 2017-08-30: 2.5 mg via RESPIRATORY_TRACT
  Filled 2017-08-30: qty 3

## 2017-08-30 MED ORDER — MONTELUKAST SODIUM 10 MG PO TABS
10.0000 mg | ORAL_TABLET | Freq: Every day | ORAL | Status: DC
  Administered 2017-08-30: 10 mg via ORAL
  Filled 2017-08-30: qty 1

## 2017-08-30 NOTE — ED Notes (Signed)
Pt assisted to bedside commode. sats dropped to mid 80s while on 2 L [place].  MD goodman notified

## 2017-08-30 NOTE — Progress Notes (Signed)
Patient admitted to unit. Oriented to room, call bell, and staff. Bed in lowest position. Fall safety plan reviewed. Full assessment to Epic. Skin assessment verified with Stacie Glaze RN. Telemetry box verification with tele clerk- Box#: [id number]. Family at bedside. Will continue to monitor.

## 2017-08-30 NOTE — Progress Notes (Signed)
Pharmacy Antibiotic Note  Shaquoia Clayton Bibles Kreisler is a 76 y.o. female admitted on 08/30/2017 with pneumonia.  Pharmacy has been consulted for levofloxacin dosing.  Plan: Levofloxacin 750 mg IV dose given today. Will order levofloxacin 500 mg IV daily to begin tomorrow.  Height: 5\' 6"  (167.6 cm) Weight: 210 lb (95.3 kg) IBW/kg (Calculated) : 59.3  Temp (24hrs), Avg:97.7 F (36.5 C), Min:97.6 F (36.4 C), Max:97.8 F (36.6 C)   Recent Labs Lab 08/30/17 0702  WBC 11.5*  CREATININE 0.94    Estimated Creatinine Clearance: 60.2 mL/min (by C-G formula based on SCr of 0.94 mg/dL).    Allergies  Allergen Reactions  . Nitrofurantoin Anaphylaxis    Antimicrobials this admission: levofloxacin 9/3 >>   Dose adjustments this admission:  Microbiology results: 9/3 BCx: Sent 9/3 Sputum: Sent   Thank you for allowing pharmacy to be a part of this patient's care.  Lenis Noon, PharmD Clinical Pharmacist 08/30/2017 10:52 AM

## 2017-08-30 NOTE — Consult Note (Signed)
Reason for Consult:SOB CHF PNA Referring Physician: Dr Benjie Karvonen hospitalist,  Dr Parks Ranger Cardiologist [place]  Megan Bolton is an 76 y.o. female.  HPI: Pt present with 3-4 days of not feeling well. She has had sob fatigue weakness cough and congestion. Denies much sputums or fever,W/U in ER suggested PNA vs bronchitis. She has been using inhalers without improvement. Now here for evaluation. Denies much cp.  Past Medical History:  Diagnosis Date  . COPD (chronic obstructive pulmonary disease) ([place])   . Diabetes mellitus without complication ([place])   . Diastolic heart failure ([place])   . Hyperlipidemia   . Hypertension     Past Surgical History:  Procedure Laterality Date  . ABDOMINAL HYSTERECTOMY    . SPINE SURGERY      Family History  Problem Relation Age of Onset  . Hypertension Mother   . Cancer Father   . Hypertension Paternal Grandfather     Social History:  reports that she quit smoking about 2 years ago. She smoked 0.50 packs per day. She has never used smokeless tobacco. She reports that she does not drink alcohol or use drugs.  Allergies:  Allergies  Allergen Reactions  . Nitrofurantoin Anaphylaxis    Medications: I have reviewed the patient's current medications.  Results for orders placed or performed during the hospital encounter of 08/30/17 (from the past 48 hour(s))  Basic metabolic panel     Status: Abnormal   Collection Time: 08/30/17  7:02 AM  Result Value Ref Range   Sodium 139 135 - 145 mmol/L   Potassium 4.1 3.5 - 5.1 mmol/L   Chloride 101 101 - 111 mmol/L   CO2 31 22 - 32 mmol/L   Glucose, Bld 245 (H) 65 - 99 mg/dL   BUN 26 (H) 6 - 20 mg/dL   Creatinine, Ser 0.94 0.44 - 1.00 mg/dL   Calcium 9.1 8.9 - 10.3 mg/dL   GFR calc non Af Amer 58 (L) >60 mL/min   GFR calc Af Amer >60 >60 mL/min    Comment: (NOTE) The eGFR has been calculated using the [name] EPI equation. This calculation has not been validated in all clinical situations. eGFR's  persistently <60 mL/min signify possible Chronic Kidney Disease.    Anion gap 7 5 - 15  CBC     Status: Abnormal   Collection Time: 08/30/17  7:02 AM  Result Value Ref Range   WBC 11.5 (H) 3.6 - 11.0 K/uL   RBC 3.92 3.80 - 5.20 MIL/uL   Hemoglobin 10.8 (L) 12.0 - 16.0 g/dL   HCT 33.6 (L) 35.0 - 47.0 %   MCV 85.8 80.0 - 100.0 fL   MCH 27.5 26.0 - 34.0 pg   MCHC 32.1 32.0 - 36.0 g/dL   RDW 13.5 11.5 - 14.5 %   Platelets 182 150 - 440 K/uL  Troponin I     Status: Abnormal   Collection Time: 08/30/17  7:02 AM  Result Value Ref Range   Troponin I 0.03 (HH) <0.03 ng/mL    Comment: CRITICAL RESULT CALLED TO, READ BACK BY AND VERIFIED WITH VALERIE CHAMBERS ON 08/30/17 AT 0750 BY JAG   Brain natriuretic peptide     Status: Abnormal   Collection Time: 08/30/17  7:02 AM  Result Value Ref Range   B Natriuretic Peptide 414.0 (H) 0.0 - 100.0 pg/mL  Glucose, capillary     Status: Abnormal   Collection Time: 08/30/17 11:25 AM  Result Value Ref Range   Glucose-Capillary 230 (H) 65 -  99 mg/dL    Dg Chest 2 View  Result Date: 08/30/2017 CLINICAL DATA:  Short of breath.  Headache EXAM: CHEST  2 VIEW COMPARISON:  Chest CT 5158 FINDINGS: Normal cardiac silhouette. Increased linear opacities at the lung bases and lesser degree the upper lobes. Most dense in the RIGHT lower lobe. Small effusions. No pneumothorax. IMPRESSION: Findings most suggestive of interstitial edema with pleural effusions. Cannot exclude RIGHT lower lobe bronchitis or pneumonia. Electronically Signed   By: Suzy Bouchard M.D.   On: 08/30/2017 07:46    Review of Systems  Constitutional: Positive for diaphoresis and malaise/fatigue.  HENT: Positive for congestion.   Eyes: Negative.   Respiratory: Positive for cough, sputum production and shortness of breath.   Cardiovascular: Positive for palpitations, orthopnea, leg swelling and PND.  Gastrointestinal: Negative.   Genitourinary: Negative.   Musculoskeletal: Negative.    Skin: Negative.   Neurological: Positive for weakness.  Endo/Heme/Allergies: Negative.   Psychiatric/Behavioral: Negative.    Blood pressure (!) 178/82, pulse 75, temperature 97.6 F (36.4 C), temperature source Oral, resp. rate 18, height 5' 6" (1.676 m), weight 95.3 kg (210 lb), SpO2 91 %. Physical Exam  Nursing note and vitals reviewed. Constitutional: She is oriented to person, place, and time. She appears well-developed and well-nourished.  HENT:  Head: Normocephalic.  Eyes: Pupils are equal, round, and reactive to light. Conjunctivae and EOM are normal.  Neck: Normal range of motion. Neck supple.  Cardiovascular: Normal rate and regular rhythm.   Murmur heard. Respiratory: She has decreased breath sounds in the right lower field. She has wheezes. She has rhonchi.  GI: Soft. Bowel sounds are normal.  Musculoskeletal: Normal range of motion.  Neurological: She is alert and oriented to person, place, and time. She has normal reflexes.  Skin: Skin is warm and dry.  Psychiatric: She has a normal mood and affect.    Assessment/Plan: CHF-D SOB COPD Bronchitis Hx smoking but quit Obesity Hypoxemia Diabetes HTN Hyperlipidemia Pneumonia . PLAN Agree with admit to tele Continue supplimental 02 Inhalers and Nebs as needed Agree with broad spectrum antibx Steroid taper for COPD / Bronchitis flare up F/U with pulmonary as outpt ECHO for SOB CHF Agree with HTN control Maintain DM control F/U cardiology as outpt 1-2 weeks  Lititia Sen D Olumide Dolinger 08/30/2017, 1:22 PM

## 2017-08-30 NOTE — ED Triage Notes (Signed)
Patient with complaint of shortness of breath that started last night. Patient has a history of COPD and wears O2 at 2L.

## 2017-08-30 NOTE — ED Provider Notes (Signed)
[facility] Emergency Department Provider Note   ____________________________________________   I have reviewed the triage vital signs and the nursing notes.   HISTORY  Chief Complaint Shortness of Breath and Chest Pain   History limited by: Not Limited   HPI Megan Bolton is a 76 y.o. female who presents to the emergency department today because of concern for shortness of breath. The shortness of breath started two nights ago. It initially was intermittent and the patient was able to sleep through it. Last night it was worse and she was not able to lie flat. It is also worse when she walks and moves around. She has a little pain that started in the left lower chest. Feels like there might be a knot there. She has had a non productive cough. The patient denies any fevers. Tried her home nebulizer and inhaler without relief.    Past Medical History:  Diagnosis Date  . COPD (chronic obstructive pulmonary disease) ([place])   . Diabetes mellitus without complication ([place])   . Hyperlipidemia   . Hypertension     Patient Active Problem List   Diagnosis Date Noted  . Adjustment disorder with mixed anxiety and depressed mood 08/13/2017  . Peripheral neuropathy 07/14/2017  . Malignant hypertension 01/29/2017  . Chronic respiratory failure with hypoxia, on home O2 therapy ([place]) 12/24/2016  . Incomplete emptying of bladder 07/19/2016  . Bacteriuria 07/17/2016  . Infection due to ESBL-producing Escherichia coli 04/21/2016  . Frequent UTI 04/21/2016  . OSA on CPAP 04/18/2016  . Bladder infection, chronic 04/17/2016  . Calculus of kidney 04/17/2016  . Community acquired pneumonia 02/28/2016  . [name] (chronic kidney disease) 07/12/2015  . Controlled type 2 diabetes mellitus with diabetic neuropathy, without long-term current use of insulin ([place]) 06/11/2015  . Arthritis, degenerative 06/11/2015  . Hypercholesteremia 06/11/2015  . 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 Injury of kidney 04/14/2015  . Essential hypertension 04/14/2015  . Blood glucose elevated 04/14/2015  . Apnea, sleep 04/14/2015  . COPD (chronic obstructive pulmonary disease) ([place]) 04/14/2015  . DDD (degenerative disc disease), lumbar 08/01/2014  . Lumbar canal stenosis 05/11/2014  . Neuritis or radiculitis due to rupture of lumbar intervertebral disc 05/11/2014  . 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 Lumbosacral spondylosis 05/11/2014  . Thoracic and lumbosacral neuritis 05/11/2014  . Accelerating angina ([place]) 03/13/2014    Past Surgical History:  Procedure Laterality Date  . ABDOMINAL HYSTERECTOMY    . SPINE SURGERY      Prior to Admission medications   Medication Sig Start Date End Date Taking? Authorizing Provider  ACCU-CHEK AVIVA PLUS test strip 1 each by Other route 2 (two) times daily. 08/27/17   Karamalegos, Devonne Doughty, DO  ACCU-CHEK SOFTCLIX LANCETS lancets 1 each by Other route 2 (two) times daily. 06/17/16   [provider]  albuterol (PROVENTIL) (2.5 MG/3ML) 0.083% nebulizer solution Take 3 mLs (2.5 mg total) by nebulization every 6 (six) hours as needed for wheezing. 12/01/16   Arlis Porta., MD  albuterol (VENTOLIN HFA) 108 (90 Base) MCG/ACT inhaler Inhale 2 puffs into the lungs every 6 (six) hours as needed for wheezing or shortness of breath. 05/12/17   Karamalegos, Devonne Doughty, DO  aspirin EC 325 MG tablet Take 325 mg by mouth daily.    [provider]  baclofen (LIORESAL) 10 MG tablet Take 0.5-1 tablets (5-10 mg total) by mouth 3 (three) times daily as needed for muscle spasms. 08/13/17  Karamalegos, Alexander J, DO  Blood Glucose Monitoring Suppl (ACCU-CHEK AVIVA PLUS) w/Device KIT USE AS DIRECTED 01/12/17   Arlis Porta., MD  carvedilol (COREG) 25 MG tablet Take 1 tablet (25 mg total) by mouth 2 (two) times daily. 12/01/16   Arlis Porta., MD  clonazePAM (KLONOPIN) 0.5 MG tablet 0.5 mg  at bedtime.  07/04/15   [provider]  cloNIDine (CATAPRES) 0.1 MG tablet Take 1 tab up to 3 times a day for any BPs >170.  Hold dose if BP lower than 170. Patient taking differently: Take by mouth 3 (three) times daily. Take 1 tab up to 3 times a day for any BPs >170.  Hold dose if BP lower than 170. 01/15/17   Arlis Porta., MD  Doxycycline Hyclate 50 MG TABS Take by mouth.    [provider]  DULoxetine (CYMBALTA) 30 MG capsule Take 1 capsule (30 mg total) by mouth daily. 08/13/17   Karamalegos, Devonne Doughty, DO  Fluticasone-Salmeterol (ADVAIR DISKUS) 250-50 MCG/DOSE AEPB Inhale 1 puff into the lungs 2 (two) times daily. 05/04/17   Karamalegos, Devonne Doughty, DO  furosemide (LASIX) 20 MG tablet TAKE ONE TABLET BY MOUTH EVERY DAY Patient not taking: Reported on 07/13/2017 12/09/16   Arlis Porta., MD  gabapentin (NEURONTIN) 800 MG tablet Take 1 tablet (800 mg total) by mouth 3 (three) times daily. 12/01/16   Arlis Porta., MD  hydrALAZINE (APRESOLINE) 50 MG tablet  08/06/17   [provider]  irbesartan-hydrochlorothiazide (AVALIDE) 300-12.5 MG tablet  08/06/17   [provider]  metFORMIN (GLUCOPHAGE) 500 MG tablet TAKE ONE TABLET BY MOUTH EVERY MORNING WITH BREAKFAST 03/24/17   Arlis Porta., MD  montelukast (SINGULAIR) 10 MG tablet TAKE ONE TABLET BY MOUTH AT BEDTIME 03/24/17   Arlis Porta., MD  OXYCONTIN 10 MG 12 hr tablet Take 10 mg by mouth 2 (two) times daily.    [provider]  OXYGEN Inhale 2 L into the lungs continuous.    [provider]  potassium chloride (K-DUR) 10 MEQ tablet TAKE ONE TABLET BY MOUTH TWICE DAILY 02/05/17   Arlis Porta., MD  tiotropium (SPIRIVA HANDIHALER) 18 MCG inhalation capsule Place 1 capsule (18 mcg total) into inhaler and inhale daily. 08/27/17   Karamalegos, Devonne Doughty, DO  valACYclovir (VALTREX) 1000 MG tablet Take 1 tablet (1,000 mg total) by mouth daily as needed (for fever  blisters). 04/19/16   Gladstone Lighter, MD    Allergies Nitrofurantoin  Family History  Problem Relation Age of Onset  . Hypertension Mother   . Cancer Father   . Hypertension Paternal Grandfather     Social History Social History  Substance Use Topics  . Smoking status: Former Smoker    Packs/day: 0.50    Quit date: 04/28/2015  . Smokeless tobacco: Never Used  . Alcohol use No    Review of Systems Constitutional: No fever/chills Eyes: No visual changes. ENT: No sore throat. Cardiovascular: Denies chest pain. Respiratory: Positive for shortness of breath. Gastrointestinal: No abdominal pain.  No nausea, no vomiting.  No diarrhea.   Genitourinary: Negative for dysuria. Musculoskeletal: Negative for back pain. Skin: Negative for rash. Neurological: Negative for headaches, focal weakness or numbness.  ____________________________________________   PHYSICAL EXAM:  VITAL SIGNS: ED Triage Vitals  Enc Vitals Group     BP 08/30/17 0658 (!) 165/70     Pulse Rate 08/30/17 0658 77  Resp 08/30/17 0658 (!) 22     Temp 08/30/17 0658 97.8 F (36.6 C)     Temp Source 08/30/17 0658 Oral     SpO2 08/30/17 0658 92 %     Weight 08/30/17 0656 210 lb (95.3 kg)     Height 08/30/17 0656 5' 6"  (1.676 m)    Constitutional: Alert and oriented. Well appearing and in no distress. Eyes: Conjunctivae are normal.  ENT   Head: Normocephalic and atraumatic.   Nose: No congestion/rhinnorhea.   Mouth/Throat: Mucous membranes are moist.   Neck: No stridor. Hematological/Lymphatic/Immunilogical: No cervical lymphadenopathy. Cardiovascular: Normal rate, regular rhythm.  No murmurs, rubs, or gallops.  Respiratory: Normal respiratory effort without tachypnea nor retractions. Diffuse expiratory wheezing.  Gastrointestinal: Soft and non tender. No rebound. No guarding.  Genitourinary: Deferred Musculoskeletal: Normal range of motion in all extremities. No lower extremity  edema. Neurologic:  Normal speech and language. No gross focal neurologic deficits are appreciated.  Skin:  Skin is warm, dry and intact. No rash noted. Psychiatric: Mood and affect are normal. Speech and behavior are normal. Patient exhibits appropriate insight and judgment.  ____________________________________________    LABS (pertinent positives/negatives)  Labs Reviewed  BASIC METABOLIC PANEL - Abnormal; Notable for the following:       Result Value   Glucose, Bld 245 (*)    BUN 26 (*)    GFR calc non Af Amer 58 (*)    All other components within normal limits  CBC - Abnormal; Notable for the following:    WBC 11.5 (*)    Hemoglobin 10.8 (*)    HCT 33.6 (*)    All other components within normal limits  TROPONIN I - Abnormal; Notable for the following:    Troponin I 0.03 (*)    All other components within normal limits  CULTURE, BLOOD (ROUTINE X 2)  CULTURE, BLOOD (ROUTINE X 2)  BRAIN NATRIURETIC PEPTIDE     ____________________________________________   EKG  I, Nance Pear, attending physician, personally viewed and interpreted this EKG  EKG Time: 0715 Rate: 72 Rhythm: normal sinus rhythm Axis: normal Intervals: qtc 453 QRS: normal ST changes: no st elevation Impression: normal ekg   ____________________________________________    RADIOLOGY  CXR IMPRESSION:  Findings most suggestive of interstitial edema with pleural  effusions. Cannot exclude RIGHT lower lobe bronchitis or pneumonia.      ____________________________________________   PROCEDURES  Procedures  ____________________________________________   INITIAL IMPRESSION / ASSESSMENT AND PLAN / ED COURSE  Pertinent labs & imaging results that were available during my care of the patient were reviewed by me and considered in my medical decision making (see chart for details).  Patient presented to the emergency department today because of concerns for shortness of breath. Workup  here is concerning for pneumonia as well as pulmonary edema. Patient will be given IV antibiotics. She did desat into the 80s when she was walking on her home O2. Will plan on admission.  ____________________________________________   FINAL CLINICAL IMPRESSION(S) / ED DIAGNOSES  Final diagnoses:  Shortness of breath  Community acquired pneumonia, unspecified laterality  Acute pulmonary edema ([place])     Note: This dictation was prepared with Dragon dictation. Any transcriptional errors that result from this process are unintentional     Nance Pear, MD 08/30/17 [phone]

## 2017-08-30 NOTE — ED Notes (Signed)
Date and time results received: 08/30/17 0750 (use smartphrase ".now" to insert current time)  Test: troponin Critical Value: 0.03  Name of Provider Notified: goodman

## 2017-08-30 NOTE — ED Notes (Signed)
Patient transported to X-ray 

## 2017-08-30 NOTE — H&P (Signed)
[facility] NAME: Megan Bolton    MR#:  [id number]  DATE OF BIRTH:  07-19-1941  DATE OF ADMISSION:  08/30/2017  PRIMARY CARE PHYSICIAN: Megan Hauser, DO   REQUESTING/REFERRING PHYSICIAN: dr Archie Balboa  CHIEF COMPLAINT:   SOB HISTORY OF PRESENT ILLNESS:  Megan Bolton  is a 76 y.o. female with a known history of  Diastolic heart failure with preserved ejection fraction by echocardiogram January 2018, hyperlipidemia and hypertension who presents with above complaint. Patient reports over the past 2 nights she has had increasing shortness of breath, dyspnea exertion and PND. She denies wheezing, cough or fevers. She was given IV Lasix in the emergency room and reports improvement in her symptoms. She is now able to lie flat.  PAST MEDICAL HISTORY:   Past Medical History:  Diagnosis Date  . COPD (chronic obstructive pulmonary disease) ([place])   . Diabetes mellitus without complication ([place])   . Diastolic heart failure ([place])   . Hyperlipidemia   . Hypertension     PAST SURGICAL HISTORY:   Past Surgical History:  Procedure Laterality Date  . ABDOMINAL HYSTERECTOMY    . SPINE SURGERY      SOCIAL HISTORY:   Social History  Substance Use Topics  . Smoking status: Former Smoker    Packs/day: 0.50    Quit date: 04/28/2015  . Smokeless tobacco: Never Used  . Alcohol use No    FAMILY HISTORY:   Family History  Problem Relation Age of Onset  . Hypertension Mother   . Cancer Father   . Hypertension Paternal Grandfather     DRUG ALLERGIES:   Allergies  Allergen Reactions  . Nitrofurantoin Anaphylaxis    REVIEW OF SYSTEMS:   Review of Systems  Constitutional: Positive for malaise/fatigue. Negative for chills and fever.  HENT: Negative.  Negative for ear discharge, ear pain, hearing loss, nosebleeds and sore throat.   Eyes: Negative.  Negative for blurred vision and pain.  Respiratory: Positive for cough and  shortness of breath. Negative for hemoptysis and wheezing.   Cardiovascular: Positive for PND. Negative for chest pain, palpitations and leg swelling.  Gastrointestinal: Negative.  Negative for abdominal pain, blood in stool, diarrhea, nausea and vomiting.  Genitourinary: Negative.  Negative for dysuria.  Musculoskeletal: Negative.  Negative for back pain.  Skin: Negative.   Neurological: Positive for weakness. Negative for dizziness, tremors, speech change, focal weakness, seizures and headaches.  Endo/Heme/Allergies: Negative.  Does not bruise/bleed easily.  Psychiatric/Behavioral: Negative.  Negative for depression, hallucinations and suicidal ideas.    MEDICATIONS AT HOME:   Prior to Admission medications   Medication Sig Start Date End Date Taking? Authorizing Provider  ACCU-CHEK AVIVA PLUS test strip 1 each by Other route 2 (two) times daily. 08/27/17  Yes Karamalegos, Alexander J, DO  albuterol (PROVENTIL) (2.5 MG/3ML) 0.083% nebulizer solution Take 3 mLs (2.5 mg total) by nebulization every 6 (six) hours as needed for wheezing. 12/01/16  Yes Arlis Porta., MD  albuterol (VENTOLIN HFA) 108 (90 Base) MCG/ACT inhaler Inhale 2 puffs into the lungs every 6 (six) hours as needed for wheezing or shortness of breath. 05/12/17  Yes Karamalegos, Devonne Doughty, DO  aspirin EC 325 MG tablet Take 325 mg by mouth daily.   Yes [provider]  Blood Glucose Monitoring Suppl (ACCU-CHEK AVIVA PLUS) w/Device KIT USE AS DIRECTED 01/12/17  Yes Arlis Porta., MD  carvedilol (COREG) 25 MG tablet Take 1 tablet (25  mg total) by mouth 2 (two) times daily. 12/01/16  Yes Arlis Porta., MD  clonazePAM (KLONOPIN) 0.5 MG tablet 0.5 mg at bedtime.  07/04/15  Yes [provider]  cloNIDine (CATAPRES) 0.1 MG tablet Take 1 tab up to 3 times a day for any BPs >170.  Hold dose if BP lower than 170. Patient taking differently: Take by mouth 3 (three) times daily. Take 1 tab up to 3 times a  day for any BPs >170.  Hold dose if BP lower than 170. 01/15/17  Yes Arlis Porta., MD  Fluticasone-Salmeterol (ADVAIR DISKUS) 250-50 MCG/DOSE AEPB Inhale 1 puff into the lungs 2 (two) times daily. 05/04/17  Yes Karamalegos, Devonne Doughty, DO  furosemide (LASIX) 20 MG tablet TAKE ONE TABLET BY MOUTH EVERY DAY 12/09/16  Yes Arlis Porta., MD  gabapentin (NEURONTIN) 800 MG tablet Take 1 tablet (800 mg total) by mouth 3 (three) times daily. 12/01/16  Yes Arlis Porta., MD  hydrALAZINE (APRESOLINE) 50 MG tablet Take 50 mg by mouth 3 (three) times daily.  08/06/17  Yes [provider]  irbesartan-hydrochlorothiazide (AVALIDE) 300-12.5 MG tablet Take 1 tablet by mouth daily.  08/06/17  Yes [provider]  metFORMIN (GLUCOPHAGE) 500 MG tablet TAKE ONE TABLET BY MOUTH EVERY MORNING WITH BREAKFAST 03/24/17  Yes Arlis Porta., MD  montelukast (SINGULAIR) 10 MG tablet TAKE ONE TABLET BY MOUTH AT BEDTIME 03/24/17  Yes Arlis Porta., MD  nortriptyline (PAMELOR) 10 MG capsule Take 10 mg by mouth at bedtime. 08/04/17  Yes [provider]  OXYCONTIN 10 MG 12 hr tablet Take 10 mg by mouth 2 (two) times daily.   Yes [provider]  OXYGEN Inhale 2 L into the lungs continuous.   Yes [provider]  potassium chloride (K-DUR) 10 MEQ tablet TAKE ONE TABLET BY MOUTH TWICE DAILY 02/05/17  Yes Arlis Porta., MD  tiotropium (SPIRIVA HANDIHALER) 18 MCG inhalation capsule Place 1 capsule (18 mcg total) into inhaler and inhale daily. 08/27/17  Yes Karamalegos, Devonne Doughty, DO  valACYclovir (VALTREX) 1000 MG tablet Take 1 tablet (1,000 mg total) by mouth daily as needed (for fever blisters). 04/19/16  Yes Gladstone Lighter, MD  ACCU-CHEK SOFTCLIX LANCETS lancets 1 each by Other route 2 (two) times daily. 06/17/16   [provider]  baclofen (LIORESAL) 10 MG tablet Take 0.5-1 tablets (5-10 mg total) by mouth 3 (three) times daily as needed for  muscle spasms. Patient not taking: Reported on 08/30/2017 08/13/17   Megan Hauser, DO  DULoxetine (CYMBALTA) 30 MG capsule Take 1 capsule (30 mg total) by mouth daily. Patient not taking: Reported on 08/30/2017 08/13/17   Megan Hauser, DO      VITAL SIGNS:  Blood pressure (!) 178/82, pulse 75, temperature 97.6 F (36.4 C), temperature source Oral, resp. rate 18, height 5' 6"  (1.676 m), weight 95.3 kg (210 lb), SpO2 91 %.  PHYSICAL EXAMINATION:   Physical Exam  Constitutional: She is oriented to person, place, and time and well-developed, well-nourished, and in no distress. No distress.  HENT:  Head: Normocephalic.  Eyes: No scleral icterus.  Neck: Normal range of motion. Neck supple. No JVD present. No tracheal deviation present.  Cardiovascular: Normal rate, regular rhythm and normal heart sounds.  Exam reveals no gallop and no friction rub.   No murmur heard. Pulmonary/Chest: Effort normal. No respiratory distress. She has no wheezes. She has no rales. She exhibits  no tenderness.  decreased right base  Abdominal: Soft. Bowel sounds are normal. She exhibits no distension and no mass. There is no tenderness. There is no rebound and no guarding.  Musculoskeletal: Normal range of motion. She exhibits no edema.  Neurological: She is alert and oriented to person, place, and time.  Skin: Skin is warm. No rash noted. No erythema.  Psychiatric: Affect and judgment normal.      LABORATORY PANEL:   CBC  Recent Labs Lab 08/30/17 0702  WBC 11.5*  HGB 10.8*  HCT 33.6*  PLT 182   ------------------------------------------------------------------------------------------------------------------  Chemistries   Recent Labs Lab 08/30/17 0702  NA 139  K 4.1  CL 101  CO2 31  GLUCOSE 245*  BUN 26*  CREATININE 0.94  CALCIUM 9.1   ------------------------------------------------------------------------------------------------------------------  Cardiac  Enzymes  Recent Labs Lab 08/30/17 0702  TROPONINI 0.03*   ------------------------------------------------------------------------------------------------------------------  RADIOLOGY:  Dg Chest 2 View  Result Date: 08/30/2017 CLINICAL DATA:  Short of breath.  Headache EXAM: CHEST  2 VIEW COMPARISON:  Chest CT 5158 FINDINGS: Normal cardiac silhouette. Increased linear opacities at the lung bases and lesser degree the upper lobes. Most dense in the RIGHT lower lobe. Small effusions. No pneumothorax. IMPRESSION: Findings most suggestive of interstitial edema with pleural effusions. Cannot exclude RIGHT lower lobe bronchitis or pneumonia. Electronically Signed   By: Suzy Bouchard M.D.   On: 08/30/2017 07:46    EKG:   Normal sinus rhythm no ST elevation or depression IMPRESSION AND PLAN:    76 year old female with history of COPD on chronic 2 L of oxygen and chronic diastolic heart failure with preserved ejection fraction by echocardiogram in January who presents with shortness of breath, dyspnea exertion and PND.  1. Acute on chronic hypoxic respiratory failure in the setting of CHF exacerbation and community-acquired pneumonia Wean oxygen to 2 L baseline  2. Acute on chronic diastolic heart failure with preserved ejection fraction: Recheck echo Continue IV Lasix Monitor intake and output with daily weight [facility] cardiology consultation CHF clinic referral at the time of discharge  3. Community-acquired pneumonia: Start Levaquin and follow up on sputum and blood culture  4. COPD with chronic respiratory failure in 2 L of oxygen without signs of exacerbation Continue inhalers  5. Diabetes: Hold metformin and start sliding scale 6. Essential hypertension: Continue Coreg, ARB and HCTZ, hydralazine     All the records are reviewed and case discussed with ED provider. Management plans discussed with the patient and she is in agreement  CODE STATUS: full  TOTAL TIME TAKING CARE  OF THIS PATIENT: 45 minutes.    Jaquita Bessire M.D on 08/30/2017 at 10:28 AM  Between 7am to 6pm - Pager - [phone]  After 6pm go to [email or web address] - password [email or web address] [facility]  Office  [phone]  CC: Primary care physician; Megan Hauser, DO

## 2017-08-30 NOTE — Discharge Instructions (Signed)
Heart Failure Clinic appointment on September 08 2017 at 8:40am with Darylene Price, [place]. Please call [phone] to reschedule.

## 2017-08-30 NOTE — ED Notes (Signed)
Pt up to bedside commode in room, urinated and sample collected for UA. Pt's oxygen sat on 2.5L dropped or 85-86%. Pt turned up to 3L. Pt appears short of breath, family at bedside. MD notified.

## 2017-08-31 ENCOUNTER — Ambulatory Visit: Payer: Medicare HMO | Admitting: Family Medicine

## 2017-08-31 ENCOUNTER — Inpatient Hospital Stay: Payer: Medicare HMO

## 2017-08-31 DIAGNOSIS — I5033 Acute on chronic diastolic (congestive) heart failure: Secondary | ICD-10-CM | POA: Diagnosis not present

## 2017-08-31 DIAGNOSIS — J9621 Acute and chronic respiratory failure with hypoxia: Secondary | ICD-10-CM | POA: Diagnosis not present

## 2017-08-31 DIAGNOSIS — R0602 Shortness of breath: Secondary | ICD-10-CM | POA: Diagnosis not present

## 2017-08-31 DIAGNOSIS — J189 Pneumonia, unspecified organism: Secondary | ICD-10-CM | POA: Diagnosis not present

## 2017-08-31 DIAGNOSIS — I509 Heart failure, unspecified: Secondary | ICD-10-CM | POA: Diagnosis not present

## 2017-08-31 DIAGNOSIS — J449 Chronic obstructive pulmonary disease, unspecified: Secondary | ICD-10-CM | POA: Diagnosis not present

## 2017-08-31 LAB — BASIC METABOLIC PANEL
ANION GAP: 7 (ref 5–15)
BUN: 26 mg/dL — ABNORMAL HIGH (ref 6–20)
CALCIUM: 9 mg/dL (ref 8.9–10.3)
CO2: 32 mmol/L (ref 22–32)
Chloride: 99 mmol/L — ABNORMAL LOW (ref 101–111)
Creatinine, Ser: 0.96 mg/dL (ref 0.44–1.00)
GFR calc non Af Amer: 56 mL/min — ABNORMAL LOW (ref >60)
Glucose, Bld: 162 mg/dL — ABNORMAL HIGH (ref 65–99)
Potassium: 4 mmol/L (ref 3.5–5.1)
Sodium: 138 mmol/L (ref 135–145)

## 2017-08-31 LAB — CBC
HCT: 31.5 % — ABNORMAL LOW (ref 35.0–47.0)
HEMOGLOBIN: 10.4 g/dL — AB (ref 12.0–16.0)
MCH: 27.9 pg (ref 26.0–34.0)
MCHC: 32.8 g/dL (ref 32.0–36.0)
MCV: 84.9 fL (ref 80.0–100.0)
Platelets: 184 10*3/uL (ref 150–440)
RBC: 3.72 MIL/uL — AB (ref 3.80–5.20)
RDW: 13.3 % (ref 11.5–14.5)
WBC: 8.4 10*3/uL (ref 3.6–11.0)

## 2017-08-31 LAB — GLUCOSE, CAPILLARY: GLUCOSE-CAPILLARY: 155 mg/dL — AB (ref 65–99)

## 2017-08-31 MED ORDER — FUROSEMIDE 20 MG PO TABS: 40.0000 mg | ORAL_TABLET | Freq: Every day | ORAL | 6 refills | Status: DC

## 2017-08-31 MED ORDER — CLONIDINE HCL 0.1 MG PO TABS: 0.1000 mg | ORAL_TABLET | Freq: Three times a day (TID) | ORAL | 0 refills | Status: DC

## 2017-08-31 MED ORDER — LEVOFLOXACIN 500 MG PO TABS: 500.0000 mg | ORAL_TABLET | Freq: Every day | ORAL | 0 refills | Status: AC

## 2017-08-31 MED ORDER — INSULIN GLARGINE 100 UNIT/ML ~~LOC~~ SOLN
8.0000 [IU] | Freq: Every day | SUBCUTANEOUS | Status: DC
  Administered 2017-08-31: 8 [IU] via SUBCUTANEOUS
  Filled 2017-08-31: qty 0.08

## 2017-08-31 NOTE — Plan of Care (Signed)
Problem: Health Behavior/Discharge Planning: Goal: Ability to manage health-related needs will improve Outcome: Completed/Met Date Met: 08/31/17 Able to care for herself at home.

## 2017-08-31 NOTE — Care Management Obs Status (Signed)
[place] NOTIFICATION   Patient Details  Name: MAGGI HERSHKOWITZ MRN: [id number] Date of Birth: 08/12/1941   Medicare Observation Status Notification Given:  Yes  Notice signed, one given to patient and the other to HIM for scanning  Katrina Stack, RN 08/31/2017, 11:28 AM

## 2017-08-31 NOTE — Discharge Summary (Signed)
[name] NAME: Megan Bolton    MR#:  [id number]  DATE OF BIRTH:  [birth year]  DATE OF ADMISSION:  08/30/2017 ADMITTING PHYSICIAN: Bettey Costa, MD  DATE OF DISCHARGE: 08/31/2017  PRIMARY CARE PHYSICIAN: Olin Hauser, DO    ADMISSION DIAGNOSIS:  Shortness of breath [R06.02] Acute pulmonary edema ([place]) [J81.0] Community acquired pneumonia, unspecified laterality [J18.9]  DISCHARGE DIAGNOSIS:  Active Problems:   CHF exacerbation ([place])   SECONDARY DIAGNOSIS:   Past Medical History:  Diagnosis Date  . COPD (chronic obstructive pulmonary disease) ([place])   . Diabetes mellitus without complication ([place])   . Diastolic heart failure ([place])   . Hyperlipidemia   . Hypertension     HOSPITAL COURSE:   76 year old female with history of COPD on chronic 2 L of oxygen and chronic diastolic heart failure with preserved ejection fraction by echocardiogram in January who presents with shortness of breath, dyspnea exertion and PND.  1. Acute on chronic hypoxic respiratory failure in the setting of CHF exacerbation and community-acquired pneumonia She has been weaned oxygen to 2 L baseline oxygen.  2. Acute on chronic diastolic heart failure with preserved ejection fraction: Patient was seen in about by cardiology. She will have close follow-up with cardiologist as an outpatient. She has diuresed well. She is no longer symptomatic. She is referred to CHF clinic at discharge. I did increase the Lasix from 20-40 mg daily. She is on potassium supplementation as well. She is on her baseline 2 L of oxygen at the time of discharge. Her lungs are clear to all sedation without crackles rales or rhonchi at the time of discharge.   3. Community-acquired pneumonia: She will continue a total 5 days of treatment of Levaquin 4. COPD with chronic respiratory failure in 2 L of oxygen without signs of exacerbation Continue inhalers  5.  Diabetes: She may resume ADA diet and her outpatient regimen   6. Essential hypertension: Continue Coreg, ARB and HCTZ, hydralazine    DISCHARGE CONDITIONS AND DIET:   Stable for discharge on diabetic heart healthy diet  CONSULTS OBTAINED:    DRUG ALLERGIES:   Allergies  Allergen Reactions  . Nitrofurantoin Anaphylaxis    DISCHARGE MEDICATIONS:   Current Discharge Medication List    START taking these medications   Details  levofloxacin (LEVAQUIN) 500 MG tablet Take 1 tablet (500 mg total) by mouth daily. Qty: 3 tablet, Refills: 0      CONTINUE these medications which have CHANGED   Details  cloNIDine (CATAPRES) 0.1 MG tablet Take 1 tablet (0.1 mg total) by mouth 3 (three) times daily. Take 1 tab up to 3 times a day for any BPs >170.  Hold dose if BP lower than 170. Qty: 90 tablet, Refills: 0    furosemide (LASIX) 20 MG tablet Take 2 tablets (40 mg total) by mouth daily. Qty: 30 tablet, Refills: 6      CONTINUE these medications which have NOT CHANGED   Details  ACCU-CHEK AVIVA PLUS test strip 1 each by Other route 2 (two) times daily. Qty: 100 each, Refills: 11    albuterol (PROVENTIL) (2.5 MG/3ML) 0.083% nebulizer solution Take 3 mLs (2.5 mg total) by nebulization every 6 (six) hours as needed for wheezing. Qty: 180 vial, Refills: 12   Associated Diagnoses: Chronic obstructive pulmonary disease with acute exacerbation (HCC)    albuterol (VENTOLIN HFA) 108 (90 Base) MCG/ACT inhaler Inhale 2 puffs into the lungs every 6 (six) hours  as needed for wheezing or shortness of breath. Qty: 2 Inhaler, Refills: 12   Associated Diagnoses: Exacerbation of asthma, unspecified asthma severity, unspecified whether persistent    aspirin EC 325 MG tablet Take 325 mg by mouth daily.    Blood Glucose Monitoring Suppl (ACCU-CHEK AVIVA PLUS) w/Device KIT USE AS DIRECTED Qty: 1 kit, Refills: 12   Associated Diagnoses: Type 2 diabetes mellitus without complication, unspecified long  term insulin use status    carvedilol (COREG) 25 MG tablet Take 1 tablet (25 mg total) by mouth 2 (two) times daily. Qty: 180 tablet, Refills: 3   Associated Diagnoses: Essential hypertension    clonazePAM (KLONOPIN) 0.5 MG tablet 0.5 mg at bedtime.     Fluticasone-Salmeterol (ADVAIR DISKUS) 250-50 MCG/DOSE AEPB Inhale 1 puff into the lungs 2 (two) times daily. Qty: 1 each, Refills: 12   Associated Diagnoses: Centrilobular emphysema (HCC)    gabapentin (NEURONTIN) 800 MG tablet Take 1 tablet (800 mg total) by mouth 3 (three) times daily. Qty: 270 tablet, Refills: 3   Associated Diagnoses: DDD (degenerative disc disease), lumbar    hydrALAZINE (APRESOLINE) 50 MG tablet Take 50 mg by mouth 3 (three) times daily.     irbesartan-hydrochlorothiazide (AVALIDE) 300-12.5 MG tablet Take 1 tablet by mouth daily.     metFORMIN (GLUCOPHAGE) 500 MG tablet TAKE ONE TABLET BY MOUTH EVERY MORNING WITH BREAKFAST Qty: 90 tablet, Refills: 3    montelukast (SINGULAIR) 10 MG tablet TAKE ONE TABLET BY MOUTH AT BEDTIME Qty: 30 tablet, Refills: 12    nortriptyline (PAMELOR) 10 MG capsule Take 10 mg by mouth at bedtime.    OXYCONTIN 10 MG 12 hr tablet Take 10 mg by mouth 2 (two) times daily.    OXYGEN Inhale 2 L into the lungs continuous.    potassium chloride (K-DUR) 10 MEQ tablet TAKE ONE TABLET BY MOUTH TWICE DAILY Qty: 60 tablet, Refills: 6   Associated Diagnoses: Hypokalemia    tiotropium (SPIRIVA HANDIHALER) 18 MCG inhalation capsule Place 1 capsule (18 mcg total) into inhaler and inhale daily. Qty: 30 capsule, Refills: 6    valACYclovir (VALTREX) 1000 MG tablet Take 1 tablet (1,000 mg total) by mouth daily as needed (for fever blisters). Qty: 30 tablet, Refills: 12    ACCU-CHEK SOFTCLIX LANCETS lancets 1 each by Other route 2 (two) times daily.      STOP taking these medications     baclofen (LIORESAL) 10 MG tablet      DULoxetine (CYMBALTA) 30 MG capsule           Today    CHIEF COMPLAINT:   Doing very well this morning. Denies PND, orthopnea, shortness of breath or lower extremity edema.   VITAL SIGNS:  Blood pressure 124/67, pulse 73, temperature 98 F (36.7 C), temperature source Oral, resp. rate 18, height _0  (1.676 m), weight 96.5 kg (212 lb 12.8 oz), SpO2 94 %.   REVIEW OF SYSTEMS:  Review of Systems  Constitutional: Negative.  Negative for chills, fever and malaise/fatigue.  HENT: Negative.  Negative for ear discharge, ear pain, hearing loss, nosebleeds and sore throat.   Eyes: Negative.  Negative for blurred vision and pain.  Respiratory: Negative.  Negative for cough, hemoptysis, shortness of breath and wheezing.   Cardiovascular: Negative.  Negative for chest pain, palpitations and leg swelling.  Gastrointestinal: Negative.  Negative for abdominal pain, blood in stool, diarrhea, nausea and vomiting.  Genitourinary: Negative.  Negative for dysuria.  Musculoskeletal: Negative.  Negative for back pain.  Skin: Negative.   Neurological: Negative for dizziness, tremors, speech change, focal weakness, seizures and headaches.  Endo/Heme/Allergies: Negative.  Does not bruise/bleed easily.  Psychiatric/Behavioral: Negative.  Negative for depression, hallucinations and suicidal ideas.     PHYSICAL EXAMINATION:  GENERAL:  76 y.o.-year-old patient lying in the bed with no acute distress.  NECK:  Supple, no jugular venous distention. No thyroid enlargement, no tenderness.  LUNGS: Normal breath sounds bilaterally, no wheezing, rales,rhonchi  No use of accessory muscles of respiration.  CARDIOVASCULAR: S1, S2 normal. No murmurs, rubs, or gallops.  ABDOMEN: Soft, non-tender, non-distended. Bowel sounds present. No organomegaly or mass.  EXTREMITIES: No pedal edema, cyanosis, or clubbing.  PSYCHIATRIC: The patient is alert and oriented x 3.  SKIN: No obvious rash, lesion, or ulcer.   DATA REVIEW:   CBC  Recent Labs Lab 08/31/17 0508  WBC 8.4   HGB 10.4*  HCT 31.5*  PLT 184    Chemistries   Recent Labs Lab 08/31/17 0508  NA 138  K 4.0  CL 99*  CO2 32  GLUCOSE 162*  BUN 26*  CREATININE 0.96  CALCIUM 9.0    Cardiac Enzymes  Recent Labs Lab 08/30/17 0702  TROPONINI 0.03*    Microbiology Results  _0 @  RADIOLOGY:  Dg Chest 1 View  Result Date: 08/31/2017 CLINICAL DATA:  Pneumonia, CHF, diabetes, COPD. EXAM: CHEST 1 VIEW COMPARISON:  PA and lateral chest x-ray of August 30, 2017 FINDINGS: The lungs are well-expanded. The interstitial markings are increased bilaterally. The cardiac silhouette is enlarged. The pulmonary vascularity is engorged. There is calcification in the wall of the aortic arch. There are small amounts of pleural fluid blunting the left lateral costophrenic angle. Subsegmental atelectasis at the right base has improved. IMPRESSION: COPD. Mild right basilar atelectasis, improving. Mild CHF, also improving. Thoracic aortic atherosclerosis. Electronically Signed   By: David  Martinique M.D.   On: 08/31/2017 07:17   Dg Chest 2 View  Result Date: 08/30/2017 CLINICAL DATA:  Short of breath.  Headache EXAM: CHEST  2 VIEW COMPARISON:  Chest CT 5158 FINDINGS: Normal cardiac silhouette. Increased linear opacities at the lung bases and lesser degree the upper lobes. Most dense in the RIGHT lower lobe. Small effusions. No pneumothorax. IMPRESSION: Findings most suggestive of interstitial edema with pleural effusions. Cannot exclude RIGHT lower lobe bronchitis or pneumonia. Electronically Signed   By: Suzy Bouchard M.D.   On: 08/30/2017 07:46      Current Discharge Medication List    START taking these medications   Details  levofloxacin (LEVAQUIN) 500 MG tablet Take 1 tablet (500 mg total) by mouth daily. Qty: 3 tablet, Refills: 0      CONTINUE these medications which have CHANGED   Details  cloNIDine (CATAPRES) 0.1 MG tablet Take 1 tablet (0.1 mg total) by mouth 3 (three) times daily. Take 1  tab up to 3 times a day for any BPs >170.  Hold dose if BP lower than 170. Qty: 90 tablet, Refills: 0    furosemide (LASIX) 20 MG tablet Take 2 tablets (40 mg total) by mouth daily. Qty: 30 tablet, Refills: 6      CONTINUE these medications which have NOT CHANGED   Details  ACCU-CHEK AVIVA PLUS test strip 1 each by Other route 2 (two) times daily. Qty: 100 each, Refills: 11    albuterol (PROVENTIL) (2.5 MG/3ML) 0.083% nebulizer solution Take 3 mLs (2.5 mg total) by nebulization every 6 (six) hours as needed for wheezing. Qty: 180 vial,  Refills: 12   Associated Diagnoses: Chronic obstructive pulmonary disease with acute exacerbation (HCC)    albuterol (VENTOLIN HFA) 108 (90 Base) MCG/ACT inhaler Inhale 2 puffs into the lungs every 6 (six) hours as needed for wheezing or shortness of breath. Qty: 2 Inhaler, Refills: 12   Associated Diagnoses: Exacerbation of asthma, unspecified asthma severity, unspecified whether persistent    aspirin EC 325 MG tablet Take 325 mg by mouth daily.    Blood Glucose Monitoring Suppl (ACCU-CHEK AVIVA PLUS) w/Device KIT USE AS DIRECTED Qty: 1 kit, Refills: 12   Associated Diagnoses: Type 2 diabetes mellitus without complication, unspecified long term insulin use status    carvedilol (COREG) 25 MG tablet Take 1 tablet (25 mg total) by mouth 2 (two) times daily. Qty: 180 tablet, Refills: 3   Associated Diagnoses: Essential hypertension    clonazePAM (KLONOPIN) 0.5 MG tablet 0.5 mg at bedtime.     Fluticasone-Salmeterol (ADVAIR DISKUS) 250-50 MCG/DOSE AEPB Inhale 1 puff into the lungs 2 (two) times daily. Qty: 1 each, Refills: 12   Associated Diagnoses: Centrilobular emphysema (HCC)    gabapentin (NEURONTIN) 800 MG tablet Take 1 tablet (800 mg total) by mouth 3 (three) times daily. Qty: 270 tablet, Refills: 3   Associated Diagnoses: DDD (degenerative disc disease), lumbar    hydrALAZINE (APRESOLINE) 50 MG tablet Take 50 mg by mouth 3 (three) times  daily.     irbesartan-hydrochlorothiazide (AVALIDE) 300-12.5 MG tablet Take 1 tablet by mouth daily.     metFORMIN (GLUCOPHAGE) 500 MG tablet TAKE ONE TABLET BY MOUTH EVERY MORNING WITH BREAKFAST Qty: 90 tablet, Refills: 3    montelukast (SINGULAIR) 10 MG tablet TAKE ONE TABLET BY MOUTH AT BEDTIME Qty: 30 tablet, Refills: 12    nortriptyline (PAMELOR) 10 MG capsule Take 10 mg by mouth at bedtime.    OXYCONTIN 10 MG 12 hr tablet Take 10 mg by mouth 2 (two) times daily.    OXYGEN Inhale 2 L into the lungs continuous.    potassium chloride (K-DUR) 10 MEQ tablet TAKE ONE TABLET BY MOUTH TWICE DAILY Qty: 60 tablet, Refills: 6   Associated Diagnoses: Hypokalemia    tiotropium (SPIRIVA HANDIHALER) 18 MCG inhalation capsule Place 1 capsule (18 mcg total) into inhaler and inhale daily. Qty: 30 capsule, Refills: 6    valACYclovir (VALTREX) 1000 MG tablet Take 1 tablet (1,000 mg total) by mouth daily as needed (for fever blisters). Qty: 30 tablet, Refills: 12    ACCU-CHEK SOFTCLIX LANCETS lancets 1 each by Other route 2 (two) times daily.      STOP taking these medications     baclofen (LIORESAL) 10 MG tablet      DULoxetine (CYMBALTA) 30 MG capsule           Management plans discussed with the patient and she is in agreement. Stable for discharge home  Patient should follow up with pcp and cardiology  CODE STATUS:     Code Status Orders        Start     Ordered   08/30/17 1021  Full code  Continuous     08/30/17 1020    Code Status History    Date Active Date Inactive Code Status Order ID Comments User Context   04/18/2016  1:42 AM 04/19/2016  5:32 PM Full Code [id number]  Lance Coon, MD ED   02/28/2016  5:40 AM 03/01/2016  5:39 PM Full Code [id number]  Saundra Shelling, MD Inpatient      TOTAL TIME TAKING CARE  OF THIS PATIENT: 37 minutes.    Note: This dictation was prepared with Dragon dictation along with smaller phrase technology. Any transcriptional errors that  result from this process are unintentional.  Fernande Treiber M.D on 08/31/2017 at 8:34 AM  Between 7am to 6pm - Pager - [phone] After 6pm go to [email or web address] - password [email or web address] [facility]  Office  [phone]  CC: Primary care physician; Olin Hauser, DO

## 2017-08-31 NOTE — Care Management (Signed)
Placed in observatoin for CHF.  Chronic home oxygen. Independent in all adls, denies issues accessing medical care, obtaining medications or with transportation.  Current with her PCP. Family to transport home.   No discharge needs identified at present by care manager or members of care team

## 2017-08-31 NOTE — Care Management CC44 (Signed)
Condition Code 44 Documentation Completed  Patient Details  Name: CAITRIN PENDERGRAPH MRN: [id number] Date of Birth: 1941-02-25   Condition Code 44 given:  Yes Patient signature on Condition Code 44 notice:  Yes Documentation of 2 MD's agreement:  Yes Code 44 added to claim:  Yes    Katrina Stack, RN 08/31/2017, 11:29 AM

## 2017-09-01 ENCOUNTER — Telehealth: Payer: Self-pay

## 2017-09-01 NOTE — Telephone Encounter (Signed)
I have made the 1st attempt to contact the patient or family member in charge, in order to follow up from recently being discharged from the hospital. I left a message on voicemail but I will make another attempt at a different time.  

## 2017-09-02 NOTE — Telephone Encounter (Signed)
I have made the 2nd attempt to contact the patient or family member in charge, in order to follow up from recently being discharged from the hospital. I left a message on voicemail but I will make another attempt at a different time.  

## 2017-09-04 LAB — CULTURE, BLOOD (ROUTINE X 2)
CULTURE: NO GROWTH
CULTURE: NO GROWTH
SPECIAL REQUESTS: ADEQUATE
Special Requests: ADEQUATE

## 2017-09-07 ENCOUNTER — Inpatient Hospital Stay: Payer: Medicare HMO | Admitting: Family Medicine

## 2017-09-07 ENCOUNTER — Other Ambulatory Visit: Payer: Self-pay | Admitting: Family Medicine

## 2017-09-07 ENCOUNTER — Ambulatory Visit (INDEPENDENT_AMBULATORY_CARE_PROVIDER_SITE_OTHER): Payer: Medicare HMO | Admitting: Family Medicine

## 2017-09-07 VITALS — BP 104/52 | HR 67 | Temp 98.0°F | Resp 16 | Ht 66.0 in | Wt 199.8 lb

## 2017-09-07 DIAGNOSIS — J9611 Chronic respiratory failure with hypoxia: Secondary | ICD-10-CM | POA: Diagnosis not present

## 2017-09-07 DIAGNOSIS — G2581 Restless legs syndrome: Secondary | ICD-10-CM | POA: Insufficient documentation

## 2017-09-07 DIAGNOSIS — Z9981 Dependence on supplemental oxygen: Secondary | ICD-10-CM | POA: Diagnosis not present

## 2017-09-07 DIAGNOSIS — I509 Heart failure, unspecified: Secondary | ICD-10-CM

## 2017-09-07 DIAGNOSIS — F4323 Adjustment disorder with mixed anxiety and depressed mood: Secondary | ICD-10-CM

## 2017-09-07 DIAGNOSIS — G6289 Other specified polyneuropathies: Secondary | ICD-10-CM

## 2017-09-07 DIAGNOSIS — R531 Weakness: Secondary | ICD-10-CM | POA: Diagnosis not present

## 2017-09-07 DIAGNOSIS — J9621 Acute and chronic respiratory failure with hypoxia: Secondary | ICD-10-CM | POA: Diagnosis not present

## 2017-09-07 MED ORDER — ROPINIROLE HCL 0.25 MG PO TABS: 0.2500 mg | ORAL_TABLET | Freq: Every day | ORAL | 2 refills | Status: DC

## 2017-09-07 NOTE — Progress Notes (Signed)
Subjective:  Shortness of breath congestion slightly improved  Objective:  Vital Signs in the last 24 hours:    Intake/Output from previous day: No intake/output data recorded. Intake/Output from this shift: No intake/output data recorded.  Physical Exam: General appearance: appears stated age Neck: no adenopathy, no carotid bruit, no JVD, supple, symmetrical, trachea midline and thyroid not enlarged, symmetric, no tenderness/mass/nodules Lungs: diminished breath sounds bibasilar and bilaterally and rhonchi bibasilar and bilaterally Heart: regular rate and rhythm, S1, S2 normal, no murmur, click, rub or gallop Abdomen: soft, non-tender; bowel sounds normal; no masses,  no organomegaly Extremities: extremities normal, atraumatic, no cyanosis or edema Pulses: 2+ and symmetric Skin: Skin color, texture, turgor normal. No rashes or lesions  Lab Results: No results for input(s): WBC, HGB, PLT in the last 72 hours. No results for input(s): NA, K, CL, CO2, GLUCOSE, BUN, CREATININE in the last 72 hours. No results for input(s): TROPONINI in the last 72 hours.  Invalid input(s): CK, MB Hepatic Function Panel No results for input(s): PROT, ALBUMIN, AST, ALT, ALKPHOS, BILITOT, BILIDIR, IBILI in the last 72 hours. No results for input(s): CHOL in the last 72 hours. No results for input(s): PROTIME in the last 72 hours.  Imaging: Imaging results have been reviewed  Cardiac Studies:  Assessment/Plan:  COPD exacerbation Hypoxemia Acute bronchitis Respiratory failure Cardiomyopathy CHF Edema Shortness of Breath  Diabetes Community coronary pneumonia . Continue supplemental oxygen therapy Agree with inhalers Broad-spectrum antibiotic therapy Agree with IV diuretic therapy for possible mild heart failure Follow-up pulmonary Continue diabetes management control Defer further cardiac workup  LOS: 0 days    Megan Bolton D Caspar Favila 09/07/2017, 2:24 PM

## 2017-09-07 NOTE — Assessment & Plan Note (Signed)
Stable, on O2 now, improved s/p resolved CHF exac - Concern with some deconditioning from recent hospital stay and other co-morbidities - Referral to [facility] for RN, PT, SW

## 2017-09-07 NOTE — Assessment & Plan Note (Signed)
Concern for component of RLS with her symptoms of neuropathy, worse at night, some muscle spasm and movement symptoms - Failed multiple therapies, alrdy on gabapentin - Start trial on Requip 0.25mg  nightly gradual titrate up to by 0.25 weekly as tolerated up to 1mg  for now - Also referral to Neurology

## 2017-09-07 NOTE — Assessment & Plan Note (Signed)
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 Also concern complicated by RLS In setting of DM and lumbar spinal stenosis history of radiculopathy, multifactorial Followed by Pain Management (Dr Nathanial Rancher, [place]) - On Oxycontin, Clonazepam - Failed: Gabapentin (still on but ineffective), Lyrica, Robaxin, Tizanidine, Amitriptyline, Nortriptyline, Duloxetine, Baclofen  Plan 1. Discussed management of peripheral neuropathy again, limited options now due to co-morbidities, age, side effects, and existing treatment 2. Referral to [facility] for eval and NCS 3. Start Requip for possible RLS 4. Continue Gabapentin 5. Remain OFF duloxetine, baclofen, nortriptyline mood/anxiety and nerve pain. 6. Follow-up 6 weeks if not improved

## 2017-09-07 NOTE — Progress Notes (Signed)
Subjective:    Patient ID: Megan Bolton, female    DOB: [birth year], 76 y.o.   MRN: [id number]  Megan Bolton is a 76 y.o. female presenting on 09/07/2017 for Hospitalization Follow-up (SOB still feels fatigue)  History primarily provided by patient, also in attendance is daughter, Annell Greening.  HPI  HOSPITAL FOLLOW-UP VISIT  Hospital/Location: [facility] Date of Admission: 08/30/17 Date of Discharge: 08/31/17 Transitions of care telephone call: Not completed, attempted twice and LVM, Tiffany Hill LPN on 9/5  Reason for Admission: Shortness of Breath Primary (+Secondary) Diagnosis: Acute on Chronic Respiratory Failure, Acute CHF exacerbation, possible CAP  - Hospital H&P and Discharge Summary have been reviewed - Patient presents today 7 days after recent hospitalization. Brief summary of recent course, patient had symptoms of dyspnea, hospitalized, treated with increased oxygen, lasix IV, and antibiotics for presumed PNA initially, most improvement was from diuresis thought possible pulmonary edema instead of CAP, her lasix was increased, diuresed well, and referred to CHF clinic on discharge. - Today reports overall has done well after discharge. Symptoms of dyspnea and edema have resolved. Now seems to endorse persistent generalized weakness and feeling reduced appetite. - She had scheduled follow-up with [facility] Cardiology Dr Clayborn Bigness on 9/5, however this was cancelled - New medications on discharge: Levaquin x 3 days - Changes to current meds on discharge: Lasix 31m daily (instead of 262m - Additionally, she states no Home Health was arranged on discharge, she had not had these services in the past  I have reviewed the discharge medication list, and have reconciled the current and discharge medications today.   Current Outpatient Prescriptions:  .  ACCU-CHEK AVIVA PLUS test strip, 1 each by Other route 2 (two) times daily., Disp: 100 each, Rfl: 11 .  ACCU-CHEK SOFTCLIX LANCETS lancets, 1  each by Other route 2 (two) times daily., Disp: , Rfl:  .  albuterol (PROVENTIL) (2.5 MG/3ML) 0.083% nebulizer solution, Take 3 mLs (2.5 mg total) by nebulization every 6 (six) hours as needed for wheezing., Disp: 180 vial, Rfl: 12 .  albuterol (VENTOLIN HFA) 108 (90 Base) MCG/ACT inhaler, Inhale 2 puffs into the lungs every 6 (six) hours as needed for wheezing or shortness of breath., Disp: 2 Inhaler, Rfl: 12 .  aspirin EC 325 MG tablet, Take 325 mg by mouth daily., Disp: , Rfl:  .  Blood Glucose Monitoring Suppl (ACCU-CHEK AVIVA PLUS) w/Device KIT, USE AS DIRECTED, Disp: 1 kit, Rfl: 12 .  carvedilol (COREG) 25 MG tablet, Take 1 tablet (25 mg total) by mouth 2 (two) times daily., Disp: 180 tablet, Rfl: 3 .  clonazePAM (KLONOPIN) 0.5 MG tablet, 0.5 mg at bedtime. , Disp: , Rfl:  .  cloNIDine (CATAPRES) 0.1 MG tablet, Take 1 tablet (0.1 mg total) by mouth 3 (three) times daily. Take 1 tab up to 3 times a day for any BPs >170.  Hold dose if BP lower than 170., Disp: 90 tablet, Rfl: 0 .  Fluticasone-Salmeterol (ADVAIR DISKUS) 250-50 MCG/DOSE AEPB, Inhale 1 puff into the lungs 2 (two) times daily., Disp: 1 each, Rfl: 12 .  furosemide (LASIX) 20 MG tablet, Take 2 tablets (40 mg total) by mouth daily., Disp: 30 tablet, Rfl: 6 .  gabapentin (NEURONTIN) 800 MG tablet, Take 1 tablet (800 mg total) by mouth 3 (three) times daily., Disp: 270 tablet, Rfl: 3 .  hydrALAZINE (APRESOLINE) 50 MG tablet, Take 50 mg by mouth 3 (three) times daily. , Disp: , Rfl:  .  irbesartan-hydrochlorothiazide (AVALIDE) 300-12.5  MG tablet, Take 1 tablet by mouth daily. , Disp: , Rfl:  .  metFORMIN (GLUCOPHAGE) 500 MG tablet, TAKE ONE TABLET BY MOUTH EVERY MORNING WITH BREAKFAST, Disp: 90 tablet, Rfl: 3 .  montelukast (SINGULAIR) 10 MG tablet, TAKE ONE TABLET BY MOUTH AT BEDTIME, Disp: 30 tablet, Rfl: 12 .  OXYCONTIN 10 MG 12 hr tablet, Take 10 mg by mouth 2 (two) times daily., Disp: , Rfl:  .  OXYGEN, Inhale 2 L into the lungs  continuous., Disp: , Rfl:  .  potassium chloride (K-DUR) 10 MEQ tablet, TAKE ONE TABLET BY MOUTH TWICE DAILY, Disp: 60 tablet, Rfl: 6 .  tiotropium (SPIRIVA HANDIHALER) 18 MCG inhalation capsule, Place 1 capsule (18 mcg total) into inhaler and inhale daily., Disp: 30 capsule, Rfl: 6 .  valACYclovir (VALTREX) 1000 MG tablet, Take 1 tablet (1,000 mg total) by mouth daily as needed (for fever blisters)., Disp: 30 tablet, Rfl: 12 .  rOPINIRole (REQUIP) 0.25 MG tablet, Take 1 tablet (0.25 mg total) by mouth at bedtime., Disp: 30 tablet, Rfl: 2  ------------------------ ADDITIONAL COMPLAINT:  Chronic Neuropathy, Bilateral Lower Extremity - Last visit with me 08/13/17, for same problem, treated with trial on baclofen and duloxetine see prior notes for background information. - Interval update with she tried both interventions and was unsuccessful, states had side effects on both and stopped after few days, difficulty describing her side effects - Today patient reports concern with persistent lower extremity symptoms, same as previously, more "pulling" and tightness in lower foot, and some burning - Takes Clonazepam 0.76m nightly with some relief, along with Oxycontin prescribed by Pain Management Dr FLisa Roca which is primarily for history of Lumbar Spinal Stenosis likely contributing - Had some improvement with Blue Emu topical with lidocaine on lower extremities - Has not seen Neurology or had nerve conduction studies - Failed multiple other meds - Admits some occasional changes due to some mood and anxiety, usually related to her actual medical problems - Denies leg swelling, redness, injury, fall, ulceration  ------------------------------------------------------------------------- Social History  Substance Use Topics  . Smoking status: Former Smoker    Packs/day: 0.50    Quit date: 04/28/2015  . Smokeless tobacco: Never Used  . Alcohol use No    Review of Systems Per HPI unless specifically  indicated above     Objective:    BP (!) 104/52 (BP Location: Right Arm, Patient Position: Sitting, Cuff Size: Normal)   Pulse 67   Temp 98 F (36.7 C) (Oral)   Resp 16   Ht 5' 6"  (1.676 m)   Wt 199 lb 12.8 oz (90.6 kg)   SpO2 97%   BMI 32.25 kg/m   Wt Readings from Last 3 Encounters:  09/07/17 199 lb 12.8 oz (90.6 kg)  08/31/17 212 lb 12.8 oz (96.5 kg)  08/13/17 216 lb 6.4 oz (98.2 kg)    Physical Exam  Constitutional: She is oriented to person, place, and time. She appears well-developed and well-nourished. No distress.  Well-appearing, comfortable, cooperative, obese  HENT:  Head: Normocephalic and atraumatic.  Mouth/Throat: Oropharynx is clear and moist.  Eyes: Conjunctivae are normal.  Neck: Normal range of motion. Neck supple.  Cardiovascular: Normal rate, regular rhythm, normal heart sounds and intact distal pulses.   No murmur heard. Pulmonary/Chest: Effort normal and breath sounds normal. No respiratory distress. She has no wheezes. She has no rales.  Portable O2 Mild reduced air movement at baseline. No new focal wheezing or crackles. Speaks full sentences.  Musculoskeletal: Normal  range of motion. She exhibits no edema.  Distal muscle strength intact 5/5 upper and lower ext  Lymphadenopathy:    She has no cervical adenopathy.  Neurological: She is alert and oriented to person, place, and time. No cranial nerve deficit.  Distal sensation to light touch reduced bilateral feet dorsal and plantar surfaces, at baseline  Skin: Skin is warm and dry. No rash noted. She is not diaphoretic. No erythema.  Psychiatric: She has a normal mood and affect. Her behavior is normal.  Well groomed, good eye contact, normal speech and thoughts  Nursing note and vitals reviewed.  Results for orders placed or performed during the hospital encounter of 08/30/17  Blood culture (routine x 2)  Result Value Ref Range   Specimen Description BLOOD LEFT ANTECUBITAL    Special Requests        BOTTLES DRAWN AEROBIC AND ANAEROBIC Blood Culture adequate volume   Culture NO GROWTH 5 DAYS    Report Status 09/04/2017 FINAL   Blood culture (routine x 2)  Result Value Ref Range   Specimen Description BLOOD BLOOD LEFT HAND    Special Requests      BOTTLES DRAWN AEROBIC AND ANAEROBIC Blood Culture adequate volume   Culture NO GROWTH 5 DAYS    Report Status 09/04/2017 FINAL   Basic metabolic panel  Result Value Ref Range   Sodium 139 135 - 145 mmol/L   Potassium 4.1 3.5 - 5.1 mmol/L   Chloride 101 101 - 111 mmol/L   CO2 31 22 - 32 mmol/L   Glucose, Bld 245 (H) 65 - 99 mg/dL   BUN 26 (H) 6 - 20 mg/dL   Creatinine, Ser 0.94 0.44 - 1.00 mg/dL   Calcium 9.1 8.9 - 10.3 mg/dL   GFR calc non Af Amer 58 (L) >60 mL/min   GFR calc Af Amer >60 >60 mL/min   Anion gap 7 5 - 15  CBC  Result Value Ref Range   WBC 11.5 (H) 3.6 - 11.0 K/uL   RBC 3.92 3.80 - 5.20 MIL/uL   Hemoglobin 10.8 (L) 12.0 - 16.0 g/dL   HCT 33.6 (L) 35.0 - 47.0 %   MCV 85.8 80.0 - 100.0 fL   MCH 27.5 26.0 - 34.0 pg   MCHC 32.1 32.0 - 36.0 g/dL   RDW 13.5 11.5 - 14.5 %   Platelets 182 150 - 440 K/uL  Troponin I  Result Value Ref Range   Troponin I 0.03 (HH) <0.03 ng/mL  Brain natriuretic peptide  Result Value Ref Range   B Natriuretic Peptide 414.0 (H) 0.0 - 100.0 pg/mL  Glucose, capillary  Result Value Ref Range   Glucose-Capillary 230 (H) 65 - 99 mg/dL  Glucose, capillary  Result Value Ref Range   Glucose-Capillary 322 (H) 65 - 99 mg/dL  Basic metabolic panel  Result Value Ref Range   Sodium 138 135 - 145 mmol/L   Potassium 4.0 3.5 - 5.1 mmol/L   Chloride 99 (L) 101 - 111 mmol/L   CO2 32 22 - 32 mmol/L   Glucose, Bld 162 (H) 65 - 99 mg/dL   BUN 26 (H) 6 - 20 mg/dL   Creatinine, Ser 0.96 0.44 - 1.00 mg/dL   Calcium 9.0 8.9 - 10.3 mg/dL   GFR calc non Af Amer 56 (L) >60 mL/min   GFR calc Af Amer >60 >60 mL/min   Anion gap 7 5 - 15  CBC  Result Value Ref Range   WBC 8.4 3.6 - 11.0  K/uL   RBC 3.72  (L) 3.80 - 5.20 MIL/uL   Hemoglobin 10.4 (L) 12.0 - 16.0 g/dL   HCT 31.5 (L) 35.0 - 47.0 %   MCV 84.9 80.0 - 100.0 fL   MCH 27.9 26.0 - 34.0 pg   MCHC 32.8 32.0 - 36.0 g/dL   RDW 13.3 11.5 - 14.5 %   Platelets 184 150 - 440 K/uL  Glucose, capillary  Result Value Ref Range   Glucose-Capillary 313 (H) 65 - 99 mg/dL  Glucose, capillary  Result Value Ref Range   Glucose-Capillary 260 (H) 65 - 99 mg/dL  Glucose, capillary  Result Value Ref Range   Glucose-Capillary 155 (H) 65 - 99 mg/dL      Assessment & Plan:   Problem List Items Addressed This Visit    Restless leg syndrome    Concern for component of RLS with her symptoms of neuropathy, worse at night, some muscle spasm and movement symptoms - Failed multiple therapies, alrdy on gabapentin - Start trial on Requip 0.19m nightly gradual titrate up to by 0.25 weekly as tolerated up to 116mfor now - Also referral to Neurology      Relevant Medications   rOPINIRole (REQUIP) 0.25 MG tablet   Peripheral neuropathy    Stable unchanged chronic bilateral stocking LE neuropathy, with reduced sensation and pain. Also concern complicated by RLS In setting of DM and lumbar spinal stenosis history of radiculopathy, multifactorial Followed by Pain Management (Dr FaNathanial RancherCaNimrod- On Oxycontin, Clonazepam - Failed: Gabapentin (still on but ineffective), Lyrica, Robaxin, Tizanidine, Amitriptyline, Nortriptyline, Duloxetine, Baclofen  Plan 1. Discussed management of peripheral neuropathy again, limited options now due to co-morbidities, age, side effects, and existing treatment [facility]. [facility] 3. Start Requip for possible RLS 4. Continue Gabapentin 5. Remain OFF duloxetine, baclofen, nortriptyline mood/anxiety and nerve pain. 6. Follow-up 6 weeks if not improved      Relevant Medications   rOPINIRole (REQUIP) 0.25 MG tablet   Other Relevant Orders   Ambulatory referral to  Neurology   Chronic respiratory failure with hypoxia, [place] O2 therapy ([facility] [place]    Stable, on O2 now, improved s/p resolved CHF exac - Concern with some deconditioning from recent hospital stay and other co-morbidities - [facility]      Relevant Orders   [facility]  Other Visit Diagnoses    [facility]      Relevant Orders   [facility], [facility]      Resolved s/p hospitalization [name], follow-up with Cardiology as planned, [facility]   Relevant Orders   Ambulatory referral to Home Health   Weakness       Relevant Orders   [facility]  Medications  . rOPINIRole (REQUIP) 0.25 MG tablet    Sig: Take 1 tablet (0.25 mg total) by mouth at bedtime.    Dispense:  30 tablet    Refill:  2    Follow up plan: Return in about 6 weeks (around 10/19/2017) for DM A1c, Neuropathy.  AlNobie PutnamDOHighland Parkedical Group 09/07/2017, 9:34 PM

## 2017-09-07 NOTE — Patient Instructions (Addendum)
Thank you for coming to the clinic today.  1.  Start Glucerna OTC meal supplement 1-2 times daily to help with nutritional intake if still poor appetite  Referral to [facility] - stay tuned for them to contact you to help  For neuropathy / restless leg  Start Requip (Ropinerole) 0.25mg  nightly, if after 1 week it does not help but you tolerate it fine, then start taking TWO pills at bedtime for 1 week, you may continue to gradually increase every week until taking 4 pills or 1 mg nightly  --------- [facility] - Neurology Dept [place], [place] [address] Phone: [phone]  Please schedule a Follow-up Appointment to: Return in about 6 weeks (around 10/19/2017) for DM A1c, Neuropathy.  If you have any other questions or concerns, please feel free to call the clinic or send a message through [place]. You may also schedule an earlier appointment if necessary.  Additionally, you may be receiving a survey about your experience at our clinic within a few days to 1 week by e-mail or mail. We value your feedback.  Nobie Putnam, DO [place]

## 2017-09-08 ENCOUNTER — Other Ambulatory Visit: Payer: Self-pay

## 2017-09-08 ENCOUNTER — Telehealth: Payer: Self-pay | Admitting: Family Medicine

## 2017-09-08 ENCOUNTER — Ambulatory Visit: Payer: Medicare HMO | Admitting: Family

## 2017-09-08 DIAGNOSIS — E1142 Type 2 diabetes mellitus with diabetic polyneuropathy: Secondary | ICD-10-CM | POA: Diagnosis not present

## 2017-09-08 DIAGNOSIS — J9611 Chronic respiratory failure with hypoxia: Secondary | ICD-10-CM | POA: Diagnosis not present

## 2017-09-08 DIAGNOSIS — R531 Weakness: Secondary | ICD-10-CM | POA: Diagnosis not present

## 2017-09-08 DIAGNOSIS — I110 Hypertensive heart disease with heart failure: Secondary | ICD-10-CM | POA: Diagnosis not present

## 2017-09-08 DIAGNOSIS — I509 Heart failure, unspecified: Secondary | ICD-10-CM | POA: Diagnosis not present

## 2017-09-08 DIAGNOSIS — G4733 Obstructive sleep apnea (adult) (pediatric): Secondary | ICD-10-CM | POA: Diagnosis not present

## 2017-09-08 DIAGNOSIS — M48061 Spinal stenosis, lumbar region without neurogenic claudication: Secondary | ICD-10-CM | POA: Diagnosis not present

## 2017-09-08 MED ORDER — ACCU-CHEK AVIVA PLUS VI STRP: 1.0000 | ORAL_STRIP | Freq: Two times a day (BID) | 11 refills | Status: AC

## 2017-09-08 MED ORDER — ACCU-CHEK SOFTCLIX LANCETS MISC: 1.0000 | Freq: Two times a day (BID) | 11 refills | Status: AC

## 2017-09-08 NOTE — Telephone Encounter (Signed)
Pt needs refills on lancets sent to [facility]

## 2017-09-09 DIAGNOSIS — I110 Hypertensive heart disease with heart failure: Secondary | ICD-10-CM | POA: Diagnosis not present

## 2017-09-09 DIAGNOSIS — J438 Other emphysema: Secondary | ICD-10-CM | POA: Diagnosis not present

## 2017-09-09 DIAGNOSIS — R531 Weakness: Secondary | ICD-10-CM | POA: Diagnosis not present

## 2017-09-09 DIAGNOSIS — E1142 Type 2 diabetes mellitus with diabetic polyneuropathy: Secondary | ICD-10-CM | POA: Diagnosis not present

## 2017-09-09 DIAGNOSIS — J449 Chronic obstructive pulmonary disease, unspecified: Secondary | ICD-10-CM | POA: Diagnosis not present

## 2017-09-09 DIAGNOSIS — I509 Heart failure, unspecified: Secondary | ICD-10-CM | POA: Diagnosis not present

## 2017-09-09 DIAGNOSIS — M48061 Spinal stenosis, lumbar region without neurogenic claudication: Secondary | ICD-10-CM | POA: Diagnosis not present

## 2017-09-09 DIAGNOSIS — J9611 Chronic respiratory failure with hypoxia: Secondary | ICD-10-CM | POA: Diagnosis not present

## 2017-09-13 DIAGNOSIS — G894 Chronic pain syndrome: Secondary | ICD-10-CM | POA: Diagnosis not present

## 2017-09-13 DIAGNOSIS — M5416 Radiculopathy, lumbar region: Secondary | ICD-10-CM | POA: Diagnosis not present

## 2017-09-13 DIAGNOSIS — F1120 Opioid dependence, uncomplicated: Secondary | ICD-10-CM | POA: Diagnosis not present

## 2017-09-13 DIAGNOSIS — Z79899 Other long term (current) drug therapy: Secondary | ICD-10-CM | POA: Diagnosis not present

## 2017-09-14 DIAGNOSIS — E1142 Type 2 diabetes mellitus with diabetic polyneuropathy: Secondary | ICD-10-CM | POA: Diagnosis not present

## 2017-09-14 DIAGNOSIS — I509 Heart failure, unspecified: Secondary | ICD-10-CM | POA: Diagnosis not present

## 2017-09-14 DIAGNOSIS — R531 Weakness: Secondary | ICD-10-CM | POA: Diagnosis not present

## 2017-09-14 DIAGNOSIS — I110 Hypertensive heart disease with heart failure: Secondary | ICD-10-CM | POA: Diagnosis not present

## 2017-09-14 DIAGNOSIS — M48061 Spinal stenosis, lumbar region without neurogenic claudication: Secondary | ICD-10-CM | POA: Diagnosis not present

## 2017-09-14 DIAGNOSIS — J9611 Chronic respiratory failure with hypoxia: Secondary | ICD-10-CM | POA: Diagnosis not present

## 2017-09-15 DIAGNOSIS — E1122 Type 2 diabetes mellitus with diabetic chronic kidney disease: Secondary | ICD-10-CM | POA: Diagnosis not present

## 2017-09-15 DIAGNOSIS — I509 Heart failure, unspecified: Secondary | ICD-10-CM | POA: Diagnosis not present

## 2017-09-15 DIAGNOSIS — J9611 Chronic respiratory failure with hypoxia: Secondary | ICD-10-CM | POA: Diagnosis not present

## 2017-09-15 DIAGNOSIS — I110 Hypertensive heart disease with heart failure: Secondary | ICD-10-CM | POA: Diagnosis not present

## 2017-09-15 DIAGNOSIS — I129 Hypertensive chronic kidney disease with stage 1 through stage 4 chronic kidney disease, or unspecified chronic kidney disease: Secondary | ICD-10-CM | POA: Diagnosis not present

## 2017-09-15 DIAGNOSIS — N183 Chronic kidney disease, stage 3 (moderate): Secondary | ICD-10-CM | POA: Diagnosis not present

## 2017-09-15 DIAGNOSIS — R531 Weakness: Secondary | ICD-10-CM | POA: Diagnosis not present

## 2017-09-15 DIAGNOSIS — M48061 Spinal stenosis, lumbar region without neurogenic claudication: Secondary | ICD-10-CM | POA: Diagnosis not present

## 2017-09-15 DIAGNOSIS — E1142 Type 2 diabetes mellitus with diabetic polyneuropathy: Secondary | ICD-10-CM | POA: Diagnosis not present

## 2017-09-17 DIAGNOSIS — I509 Heart failure, unspecified: Secondary | ICD-10-CM | POA: Diagnosis not present

## 2017-09-17 DIAGNOSIS — J9611 Chronic respiratory failure with hypoxia: Secondary | ICD-10-CM | POA: Diagnosis not present

## 2017-09-17 DIAGNOSIS — R531 Weakness: Secondary | ICD-10-CM | POA: Diagnosis not present

## 2017-09-17 DIAGNOSIS — E1142 Type 2 diabetes mellitus with diabetic polyneuropathy: Secondary | ICD-10-CM | POA: Diagnosis not present

## 2017-09-17 DIAGNOSIS — M48061 Spinal stenosis, lumbar region without neurogenic claudication: Secondary | ICD-10-CM | POA: Diagnosis not present

## 2017-09-17 DIAGNOSIS — I110 Hypertensive heart disease with heart failure: Secondary | ICD-10-CM | POA: Diagnosis not present

## 2017-09-20 DIAGNOSIS — J449 Chronic obstructive pulmonary disease, unspecified: Secondary | ICD-10-CM | POA: Diagnosis not present

## 2017-09-21 DIAGNOSIS — I110 Hypertensive heart disease with heart failure: Secondary | ICD-10-CM | POA: Diagnosis not present

## 2017-09-21 DIAGNOSIS — R531 Weakness: Secondary | ICD-10-CM | POA: Diagnosis not present

## 2017-09-21 DIAGNOSIS — E1142 Type 2 diabetes mellitus with diabetic polyneuropathy: Secondary | ICD-10-CM | POA: Diagnosis not present

## 2017-09-21 DIAGNOSIS — I509 Heart failure, unspecified: Secondary | ICD-10-CM | POA: Diagnosis not present

## 2017-09-21 DIAGNOSIS — J9611 Chronic respiratory failure with hypoxia: Secondary | ICD-10-CM | POA: Diagnosis not present

## 2017-09-21 DIAGNOSIS — M48061 Spinal stenosis, lumbar region without neurogenic claudication: Secondary | ICD-10-CM | POA: Diagnosis not present

## 2017-09-22 ENCOUNTER — Ambulatory Visit: Payer: Medicare HMO | Admitting: Family Medicine

## 2017-09-22 DIAGNOSIS — J9611 Chronic respiratory failure with hypoxia: Secondary | ICD-10-CM | POA: Diagnosis not present

## 2017-09-22 DIAGNOSIS — I110 Hypertensive heart disease with heart failure: Secondary | ICD-10-CM | POA: Diagnosis not present

## 2017-09-22 DIAGNOSIS — M48061 Spinal stenosis, lumbar region without neurogenic claudication: Secondary | ICD-10-CM | POA: Diagnosis not present

## 2017-09-22 DIAGNOSIS — E1142 Type 2 diabetes mellitus with diabetic polyneuropathy: Secondary | ICD-10-CM | POA: Diagnosis not present

## 2017-09-22 DIAGNOSIS — R531 Weakness: Secondary | ICD-10-CM | POA: Diagnosis not present

## 2017-09-22 DIAGNOSIS — I509 Heart failure, unspecified: Secondary | ICD-10-CM | POA: Diagnosis not present

## 2017-09-23 DIAGNOSIS — J9611 Chronic respiratory failure with hypoxia: Secondary | ICD-10-CM | POA: Diagnosis not present

## 2017-09-23 DIAGNOSIS — I509 Heart failure, unspecified: Secondary | ICD-10-CM | POA: Diagnosis not present

## 2017-09-23 DIAGNOSIS — E1142 Type 2 diabetes mellitus with diabetic polyneuropathy: Secondary | ICD-10-CM | POA: Diagnosis not present

## 2017-09-23 DIAGNOSIS — M48061 Spinal stenosis, lumbar region without neurogenic claudication: Secondary | ICD-10-CM | POA: Diagnosis not present

## 2017-09-23 DIAGNOSIS — R531 Weakness: Secondary | ICD-10-CM | POA: Diagnosis not present

## 2017-09-23 DIAGNOSIS — I110 Hypertensive heart disease with heart failure: Secondary | ICD-10-CM | POA: Diagnosis not present

## 2017-09-29 DIAGNOSIS — M48061 Spinal stenosis, lumbar region without neurogenic claudication: Secondary | ICD-10-CM | POA: Diagnosis not present

## 2017-09-29 DIAGNOSIS — J9611 Chronic respiratory failure with hypoxia: Secondary | ICD-10-CM | POA: Diagnosis not present

## 2017-09-29 DIAGNOSIS — I509 Heart failure, unspecified: Secondary | ICD-10-CM | POA: Diagnosis not present

## 2017-09-29 DIAGNOSIS — E1142 Type 2 diabetes mellitus with diabetic polyneuropathy: Secondary | ICD-10-CM | POA: Diagnosis not present

## 2017-09-29 DIAGNOSIS — R531 Weakness: Secondary | ICD-10-CM | POA: Diagnosis not present

## 2017-09-29 DIAGNOSIS — I110 Hypertensive heart disease with heart failure: Secondary | ICD-10-CM | POA: Diagnosis not present

## 2017-09-30 ENCOUNTER — Other Ambulatory Visit: Payer: Self-pay

## 2017-10-04 DIAGNOSIS — R202 Paresthesia of skin: Secondary | ICD-10-CM | POA: Diagnosis not present

## 2017-10-04 DIAGNOSIS — R200 Anesthesia of skin: Secondary | ICD-10-CM | POA: Diagnosis not present

## 2017-10-08 DIAGNOSIS — J9611 Chronic respiratory failure with hypoxia: Secondary | ICD-10-CM | POA: Diagnosis not present

## 2017-10-08 DIAGNOSIS — I110 Hypertensive heart disease with heart failure: Secondary | ICD-10-CM | POA: Diagnosis not present

## 2017-10-08 DIAGNOSIS — E1142 Type 2 diabetes mellitus with diabetic polyneuropathy: Secondary | ICD-10-CM | POA: Diagnosis not present

## 2017-10-08 DIAGNOSIS — R531 Weakness: Secondary | ICD-10-CM | POA: Diagnosis not present

## 2017-10-08 DIAGNOSIS — I509 Heart failure, unspecified: Secondary | ICD-10-CM | POA: Diagnosis not present

## 2017-10-08 DIAGNOSIS — M48061 Spinal stenosis, lumbar region without neurogenic claudication: Secondary | ICD-10-CM | POA: Diagnosis not present

## 2017-10-09 DIAGNOSIS — J438 Other emphysema: Secondary | ICD-10-CM | POA: Diagnosis not present

## 2017-10-09 DIAGNOSIS — J449 Chronic obstructive pulmonary disease, unspecified: Secondary | ICD-10-CM | POA: Diagnosis not present

## 2017-10-12 ENCOUNTER — Other Ambulatory Visit: Payer: Self-pay

## 2017-10-12 DIAGNOSIS — E876 Hypokalemia: Secondary | ICD-10-CM

## 2017-10-12 MED ORDER — POTASSIUM CHLORIDE ER 10 MEQ PO TBCR: 10.0000 meq | EXTENDED_RELEASE_TABLET | Freq: Two times a day (BID) | ORAL | 5 refills | Status: AC

## 2017-10-20 DIAGNOSIS — J449 Chronic obstructive pulmonary disease, unspecified: Secondary | ICD-10-CM | POA: Diagnosis not present

## 2017-10-22 ENCOUNTER — Ambulatory Visit: Payer: Medicare HMO | Admitting: Family Medicine

## 2017-10-26 ENCOUNTER — Ambulatory Visit (INDEPENDENT_AMBULATORY_CARE_PROVIDER_SITE_OTHER): Payer: Medicare HMO

## 2017-10-26 ENCOUNTER — Ambulatory Visit (INDEPENDENT_AMBULATORY_CARE_PROVIDER_SITE_OTHER): Payer: Medicare HMO | Admitting: Family Medicine

## 2017-10-26 VITALS — BP 118/68 | HR 62 | Temp 97.7°F | Resp 20 | Ht 66.0 in | Wt 217.0 lb

## 2017-10-26 DIAGNOSIS — Z0000 Encounter for general adult medical examination without abnormal findings: Secondary | ICD-10-CM | POA: Diagnosis not present

## 2017-10-26 NOTE — Patient Instructions (Addendum)
Megan Bolton , Thank you for taking time to come for your Medicare Wellness Visit. I appreciate your ongoing commitment to your health goals. Please review the following plan we discussed and let me know if I can assist you in the future.   Screening recommendations/referrals: Colonoscopy: no longer required Mammogram: no longer required Bone Density: patient reported, please bring a copy of your records  Recommended yearly ophthalmology/optometry visit for glaucoma screening and checkup Recommended yearly dental visit for hygiene and checkup  Vaccinations: Influenza vaccine: up to date Pneumococcal vaccine: up to date Tdap vaccine: up to date Shingles vaccine: due, check with your insurance company for coverage   Advanced directives: Advance directive discussed with you today. Even though you declined this today please call our office should you change your mind and we can give you the proper paperwork for you to fill out.  Conditions/risks identified: Recommend drinking at least 3-4 glasses of water a day   Next appointment: Follow up in one year for your annual wellness visit.    Preventive Care 76 Years and Older, Female Preventive care refers to lifestyle choices and visits with your health care provider that can promote health and wellness. What does preventive care include?  A yearly physical exam. This is also called an annual well check.  Dental exams once or twice a year.  Routine eye exams. Ask your health care provider how often you should have your eyes checked.  Personal lifestyle choices, including:  Daily care of your teeth and gums.  Regular physical activity.  Eating a healthy diet.  Avoiding tobacco and drug use.  Limiting alcohol use.  Practicing safe sex.  Taking low-dose aspirin every day.  Taking vitamin and mineral supplements as recommended by your health care provider. What happens during an annual well check? The services and screenings done  by your health care provider during your annual well check will depend on your age, overall health, lifestyle risk factors, and family history of disease. Counseling  Your health care provider may ask you questions about your:  Alcohol use.  Tobacco use.  Drug use.  Emotional well-being.  Home and relationship well-being.  Sexual activity.  Eating habits.  History of falls.  Memory and ability to understand (cognition).  Work and work Statistician.  Reproductive health. Screening  You may have the following tests or measurements:  Height, weight, and BMI.  Blood pressure.  Lipid and cholesterol levels. These may be checked every 5 years, or more frequently if you are over 27 years old.  Skin check.  Lung cancer screening. You may have this screening every year starting at age 8 if you have a 30-pack-year history of smoking and currently smoke or have quit within the past 15 years.  Fecal occult blood test (FOBT) of the stool. You may have this test every year starting at age 64.  Flexible sigmoidoscopy or colonoscopy. You may have a sigmoidoscopy every 5 years or a colonoscopy every 10 years starting at age 65.  Hepatitis C blood test.  Hepatitis B blood test.  Sexually transmitted disease (STD) testing.  Diabetes screening. This is done by checking your blood sugar (glucose) after you have not eaten for a while (fasting). You may have this done every 1-3 years.  Bone density scan. This is done to screen for osteoporosis. You may have this done starting at age 81.  Mammogram. This may be done every 1-2 years. Talk to your health care provider about how often you should  have regular mammograms. Talk with your health care provider about your test results, treatment options, and if necessary, the need for more tests. Vaccines  Your health care provider may recommend certain vaccines, such as:  Influenza vaccine. This is recommended every year.  Tetanus,  diphtheria, and acellular pertussis (Tdap, Td) vaccine. You may need a Td booster every 10 years.  Zoster vaccine. You may need this after age 34.  Pneumococcal 13-valent conjugate (PCV13) vaccine. One dose is recommended after age 30.  Pneumococcal polysaccharide (PPSV23) vaccine. One dose is recommended after age 55. Talk to your health care provider about which screenings and vaccines you need and how often you need them. This information is not intended to replace advice given to you by your health care provider. Make sure you discuss any questions you have with your health care provider. Document Released: 01/10/2016 Document Revised: 09/02/2016 Document Reviewed: 10/15/2015 Elsevier Interactive Patient Education  2017 [place] Prevention in the Home Falls can cause injuries. They can happen to people of all ages. There are many things you can do to make your home safe and to help prevent falls. What can I do on the outside of my home?  Regularly fix the edges of walkways and driveways and fix any cracks.  Remove anything that might make you trip as you walk through a door, such as a raised step or threshold.  Trim any bushes or trees on the path to your home.  Use bright outdoor lighting.  Clear any walking paths of anything that might make someone trip, such as rocks or tools.  Regularly check to see if handrails are loose or broken. Make sure that both sides of any steps have handrails.  Any raised decks and porches should have guardrails on the edges.  Have any leaves, snow, or ice cleared regularly.  Use sand or salt on walking paths during winter.  Clean up any spills in your garage right away. This includes oil or grease spills. What can I do in the bathroom?  Use night lights.  Install grab bars by the toilet and in the tub and shower. Do not use towel bars as grab bars.  Use non-skid mats or decals in the tub or shower.  If you need to sit down in  the shower, use a plastic, non-slip stool.  Keep the floor dry. Clean up any water that spills on the floor as soon as it happens.  Remove soap buildup in the tub or shower regularly.  Attach bath mats securely with double-sided non-slip rug tape.  Do not have throw rugs and other things on the floor that can make you trip. What can I do in the bedroom?  Use night lights.  Make sure that you have a light by your bed that is easy to reach.  Do not use any sheets or blankets that are too big for your bed. They should not hang down onto the floor.  Have a firm chair that has side arms. You can use this for support while you get dressed.  Do not have throw rugs and other things on the floor that can make you trip. What can I do in the kitchen?  Clean up any spills right away.  Avoid walking on wet floors.  Keep items that you use a lot in easy-to-reach places.  If you need to reach something above you, use a strong step stool that has a grab bar.  Keep electrical cords out of  the way.  Do not use floor polish or wax that makes floors slippery. If you must use wax, use non-skid floor wax.  Do not have throw rugs and other things on the floor that can make you trip. What can I do with my stairs?  Do not leave any items on the stairs.  Make sure that there are handrails on both sides of the stairs and use them. Fix handrails that are broken or loose. Make sure that handrails are as long as the stairways.  Check any carpeting to make sure that it is firmly attached to the stairs. Fix any carpet that is loose or worn.  Avoid having throw rugs at the top or bottom of the stairs. If you do have throw rugs, attach them to the floor with carpet tape.  Make sure that you have a light switch at the top of the stairs and the bottom of the stairs. If you do not have them, ask someone to add them for you. What else can I do to help prevent falls?  Wear shoes that:  Do not have high  heels.  Have rubber bottoms.  Are comfortable and fit you well.  Are closed at the toe. Do not wear sandals.  If you use a stepladder:  Make sure that it is fully opened. Do not climb a closed stepladder.  Make sure that both sides of the stepladder are locked into place.  Ask someone to hold it for you, if possible.  Clearly mark and make sure that you can see:  Any grab bars or handrails.  First and last steps.  Where the edge of each step is.  Use tools that help you move around (mobility aids) if they are needed. These include:  Canes.  Walkers.  Scooters.  Crutches.  Turn on the lights when you go into a dark area. Replace any light bulbs as soon as they burn out.  Set up your furniture so you have a clear path. Avoid moving your furniture around.  If any of your floors are uneven, fix them.  If there are any pets around you, be aware of where they are.  Review your medicines with your doctor. Some medicines can make you feel dizzy. This can increase your chance of falling. Ask your doctor what other things that you can do to help prevent falls. This information is not intended to replace advice given to you by your health care provider. Make sure you discuss any questions you have with your health care provider. Document Released: 10/10/2009 Document Revised: 05/21/2016 Document Reviewed: 01/18/2015 Elsevier Interactive Patient Education  2017 [employer].

## 2017-10-26 NOTE — Progress Notes (Signed)
Subjective:   Megan Bolton is a 76 y.o. female who presents for Medicare Annual (Subsequent) preventive examination.  Review of Systems:  Cardiac Risk Factors include: advanced age (>70mn, >>55women);hypertension;diabetes mellitus;obesity (BMI >30kg/m2)     Objective:     Vitals: BP 118/68 (BP Location: Left Arm, Patient Position: Sitting)   Pulse 62   Temp 97.7 F (36.5 C) (Oral)   Resp 20   Ht 5' 6"  (1.676 m)   Wt 217 lb (98.4 kg)   BMI 35.02 kg/m   Body mass index is 35.02 kg/m.   Tobacco History  Smoking Status  . Former Smoker  . Packs/day: 0.50  . Quit date: 04/28/2015  Smokeless Tobacco  . Never Used     Counseling given: Not Answered   Past Medical History:  Diagnosis Date  . COPD (chronic obstructive pulmonary disease) ([facility]   . Diabetes mellitus without complication ([facility] [place]   . Diastolic heart failure ([facility] [place]   . Hyperlipidemia   . Hypertension    Past Surgical History:  Procedure Laterality Date  . ABDOMINAL HYSTERECTOMY    . SPINE SURGERY     Family History  Problem Relation Age of Onset  . Hypertension Mother   . Colon cancer Father   . Hypertension Paternal Grandfather    History  Sexual Activity  . Sexual activity: Not on file    Outpatient Encounter Prescriptions as of 10/26/2017  Medication Sig  . ACCU-CHEK AVIVA PLUS test strip 1 each by Other route 2 (two) times daily.  .Marland KitchenACCU-CHEK SOFTCLIX LANCETS lancets 1 each by Other route 2 (two) times daily.  .Marland Kitchenalbuterol (PROVENTIL) (2.5 MG/3ML) 0.083% nebulizer solution Take 3 mLs (2.5 mg total) by nebulization every 6 (six) hours as needed for wheezing.  .Marland Kitchenalbuterol (VENTOLIN HFA) 108 (90 Base) MCG/ACT inhaler Inhale 2 puffs into the lungs every 6 (six) hours as needed for wheezing or shortness of breath.  .Marland Kitchenaspirin EC 325 MG tablet Take 325 mg by mouth daily.  . Blood Glucose Monitoring Suppl (ACCU-CHEK AVIVA PLUS) w/Device KIT USE AS DIRECTED  . carvedilol (COREG) 25 MG tablet Take 1  tablet (25 mg total) by mouth 2 (two) times daily.  . clonazePAM (KLONOPIN) 0.5 MG tablet 0.5 mg at bedtime.   . cloNIDine (CATAPRES) 0.1 MG tablet Take 1 tablet (0.1 mg total) by mouth 3 (three) times daily. Take 1 tab up to 3 times a day for any BPs >170.  Hold dose if BP lower than 170.  .Marland Kitchendoxycycline (VIBRAMYCIN) 100 MG capsule   . Fluticasone-Salmeterol (ADVAIR DISKUS) 250-50 MCG/DOSE AEPB Inhale 1 puff into the lungs 2 (two) times daily.  . furosemide (LASIX) 20 MG tablet Take 2 tablets (40 mg total) by mouth daily.  .Marland Kitchengabapentin (NEURONTIN) 800 MG tablet Take 1 tablet (800 mg total) by mouth 3 (three) times daily.  . hydrALAZINE (APRESOLINE) 50 MG tablet Take 50 mg by mouth 3 (three) times daily.   . irbesartan-hydrochlorothiazide (AVALIDE) 300-12.5 MG tablet Take 1 tablet by mouth daily.   . metFORMIN (GLUCOPHAGE) 500 MG tablet TAKE ONE TABLET BY MOUTH EVERY MORNING WITH BREAKFAST  . montelukast (SINGULAIR) 10 MG tablet TAKE ONE TABLET BY MOUTH AT BEDTIME  . MOVANTIK 12.5 MG TABS tablet   . OXYCONTIN 10 MG 12 hr tablet Take 10 mg by mouth 2 (two) times daily.  . OXYGEN Inhale 2 L into the lungs continuous.  . potassium chloride (K-DUR) 10 MEQ tablet Take 1 tablet (10 mEq  total) by mouth 2 (two) times daily.  Marland Kitchen tiotropium (SPIRIVA HANDIHALER) 18 MCG inhalation capsule Place 1 capsule (18 mcg total) into inhaler and inhale daily.  . valACYclovir (VALTREX) 1000 MG tablet Take 1 tablet (1,000 mg total) by mouth daily as needed (for fever blisters).  . [DISCONTINUED] rOPINIRole (REQUIP) 0.25 MG tablet Take 1 tablet (0.25 mg total) by mouth at bedtime. (Patient not taking: Reported on 10/26/2017)   No facility-administered encounter medications on file as of 10/26/2017.     Activities of Daily Living In your present state of health, do you have any difficulty performing the following activities: 10/26/2017 08/31/2017  Hearing? N -  Vision? N -  Difficulty concentrating or making decisions?  N -  Walking or climbing stairs? Y -  Dressing or bathing? N -  Doing errands, shopping? Y N  Preparing Food and eating ? N -  Using the Toilet? N -  In the past six months, have you accidently leaked urine? N -  Do you have problems with loss of bowel control? N -  Managing your Medications? N -  Managing your Finances? N -  Housekeeping or managing your Housekeeping? N -  Some recent data might be hidden    Patient Care Team: Olin Hauser, DO as PCP - General (Family Medicine)    Assessment:     Exercise Activities and Dietary recommendations Current Exercise Habits: The patient does not participate in regular exercise at present, Exercise limited by: orthopedic condition(s);neurologic condition(s) (neuropathy)  Goals    . Increase water intake          Recommend drinking at least 3-4 glasses of water a day       Fall Risk Fall Risk  10/26/2017 08/25/2016 07/27/2016 09/24/2015 07/12/2015  Falls in the past year? No No No No No  Risk for fall due to : - - - - -   Depression Screen PHQ 2/9 Scores 10/26/2017 08/13/2017 08/25/2016 07/27/2016  PHQ - 2 Score 0 1 0 0  PHQ- 9 Score 2 8 - -     Cognitive Function     6CIT Screen 10/26/2017  What Year? 0 points  What month? 0 points  What time? 0 points  Count back from 20 0 points  Months in reverse 0 points  Repeat phrase 2 points  Total Score 2    Immunization History  Administered Date(s) Administered  . Influenza, High Dose Seasonal PF 09/28/2016  . Influenza,inj,Quad PF,6+ Mos 10/16/2015  . Influenza-Unspecified 09/27/2014, 09/23/2017  . Pneumococcal Polysaccharide-23 09/28/2016   Screening Tests Health Maintenance  Topic Date Due  . HEMOGLOBIN A1C  11/18/2017  . FOOT EXAM  05/18/2018  . OPHTHALMOLOGY EXAM  07/28/2018  . TETANUS/TDAP  01/25/2025  . INFLUENZA VACCINE  Completed  . DEXA SCAN  Completed  . PNA vac Low Risk Adult  Completed      Plan:     I have personally reviewed and  addressed the Medicare Annual Wellness questionnaire and have noted the following in the patient's chart:  A. Medical and social history B. Use of alcohol, tobacco or illicit drugs  C. Current medications and supplements D. Functional ability and status E.  Nutritional status F.  Physical activity G. Advance directives H. List of other physicians I.  Hospitalizations, surgeries, and ER visits in previous 12 months J.  [place] such as hearing and vision if needed, cognitive and depression L. Referrals and appointments   In addition, I have reviewed  and discussed with patient certain preventive protocols, quality metrics, and best practice recommendations. A written personalized care plan for preventive services as well as general preventive health recommendations were provided to patient.   Signed,  Tyler Aas, LPN Nurse Health Advisor   MD Recommendations: requested to cancel todays appt with PCP, states she is seeing neurology for her neuropathy and its doing fine. She states she will call and set up a new appt at a later time.

## 2017-11-04 DIAGNOSIS — E785 Hyperlipidemia, unspecified: Secondary | ICD-10-CM | POA: Diagnosis not present

## 2017-11-04 DIAGNOSIS — J42 Unspecified chronic bronchitis: Secondary | ICD-10-CM | POA: Diagnosis not present

## 2017-11-04 DIAGNOSIS — N289 Disorder of kidney and ureter, unspecified: Secondary | ICD-10-CM | POA: Diagnosis not present

## 2017-11-04 DIAGNOSIS — I701 Atherosclerosis of renal artery: Secondary | ICD-10-CM | POA: Diagnosis not present

## 2017-11-04 DIAGNOSIS — F17200 Nicotine dependence, unspecified, uncomplicated: Secondary | ICD-10-CM | POA: Diagnosis not present

## 2017-11-04 DIAGNOSIS — Z7689 Persons encountering health services in other specified circumstances: Secondary | ICD-10-CM | POA: Diagnosis not present

## 2017-11-04 DIAGNOSIS — I10 Essential (primary) hypertension: Secondary | ICD-10-CM | POA: Diagnosis not present

## 2017-11-04 DIAGNOSIS — G4733 Obstructive sleep apnea (adult) (pediatric): Secondary | ICD-10-CM | POA: Diagnosis not present

## 2017-11-04 DIAGNOSIS — I270 Primary pulmonary hypertension: Secondary | ICD-10-CM | POA: Diagnosis not present

## 2017-11-08 DIAGNOSIS — M5416 Radiculopathy, lumbar region: Secondary | ICD-10-CM | POA: Diagnosis not present

## 2017-11-08 DIAGNOSIS — F1120 Opioid dependence, uncomplicated: Secondary | ICD-10-CM | POA: Diagnosis not present

## 2017-11-08 DIAGNOSIS — G894 Chronic pain syndrome: Secondary | ICD-10-CM | POA: Diagnosis not present

## 2017-11-08 DIAGNOSIS — Z79899 Other long term (current) drug therapy: Secondary | ICD-10-CM | POA: Diagnosis not present

## 2017-11-09 DIAGNOSIS — J449 Chronic obstructive pulmonary disease, unspecified: Secondary | ICD-10-CM | POA: Diagnosis not present

## 2017-11-09 DIAGNOSIS — J438 Other emphysema: Secondary | ICD-10-CM | POA: Diagnosis not present

## 2017-11-20 DIAGNOSIS — J449 Chronic obstructive pulmonary disease, unspecified: Secondary | ICD-10-CM | POA: Diagnosis not present

## 2017-11-24 ENCOUNTER — Other Ambulatory Visit: Payer: Self-pay

## 2017-11-24 DIAGNOSIS — I10 Essential (primary) hypertension: Secondary | ICD-10-CM

## 2017-11-24 MED ORDER — CARVEDILOL 25 MG PO TABS: 25.0000 mg | ORAL_TABLET | Freq: Two times a day (BID) | ORAL | 3 refills | Status: DC

## 2017-12-08 DIAGNOSIS — E119 Type 2 diabetes mellitus without complications: Secondary | ICD-10-CM | POA: Diagnosis not present

## 2017-12-08 DIAGNOSIS — G4733 Obstructive sleep apnea (adult) (pediatric): Secondary | ICD-10-CM | POA: Diagnosis not present

## 2017-12-08 DIAGNOSIS — E7800 Pure hypercholesterolemia, unspecified: Secondary | ICD-10-CM | POA: Diagnosis not present

## 2017-12-08 DIAGNOSIS — Z5181 Encounter for therapeutic drug level monitoring: Secondary | ICD-10-CM | POA: Diagnosis not present

## 2017-12-08 DIAGNOSIS — Z1329 Encounter for screening for other suspected endocrine disorder: Secondary | ICD-10-CM | POA: Diagnosis not present

## 2017-12-08 DIAGNOSIS — K219 Gastro-esophageal reflux disease without esophagitis: Secondary | ICD-10-CM | POA: Diagnosis not present

## 2017-12-08 DIAGNOSIS — J439 Emphysema, unspecified: Secondary | ICD-10-CM | POA: Diagnosis not present

## 2017-12-08 DIAGNOSIS — I10 Essential (primary) hypertension: Secondary | ICD-10-CM | POA: Diagnosis not present

## 2017-12-09 DIAGNOSIS — J438 Other emphysema: Secondary | ICD-10-CM | POA: Diagnosis not present

## 2017-12-09 DIAGNOSIS — J449 Chronic obstructive pulmonary disease, unspecified: Secondary | ICD-10-CM | POA: Diagnosis not present

## 2017-12-19 ENCOUNTER — Inpatient Hospital Stay: Admit: 2017-12-19 | Payer: Medicare Other

## 2017-12-19 ENCOUNTER — Inpatient Hospital Stay
Admission: EM | Admit: 2017-12-19 | Discharge: 2017-12-19 | DRG: 292 | Disposition: A | Discharge status: Home / Self Care | Payer: Medicare Other | Attending: Internal Medicine | Admitting: Internal Medicine

## 2017-12-19 ENCOUNTER — Emergency Department: Payer: Medicare Other

## 2017-12-19 ENCOUNTER — Encounter: Payer: Self-pay | Admitting: Emergency Medicine

## 2017-12-19 ENCOUNTER — Other Ambulatory Visit: Payer: Self-pay

## 2017-12-19 DIAGNOSIS — Z888 Allergy status to other drugs, medicaments and biological substances status: Secondary | ICD-10-CM

## 2017-12-19 DIAGNOSIS — Z87891 Personal history of nicotine dependence: Secondary | ICD-10-CM | POA: Diagnosis not present

## 2017-12-19 DIAGNOSIS — G4733 Obstructive sleep apnea (adult) (pediatric): Secondary | ICD-10-CM | POA: Diagnosis not present

## 2017-12-19 DIAGNOSIS — Z9981 Dependence on supplemental oxygen: Secondary | ICD-10-CM

## 2017-12-19 DIAGNOSIS — Z8249 Family history of ischemic heart disease and other diseases of the circulatory system: Secondary | ICD-10-CM | POA: Diagnosis not present

## 2017-12-19 DIAGNOSIS — R0602 Shortness of breath: Secondary | ICD-10-CM | POA: Diagnosis not present

## 2017-12-19 DIAGNOSIS — J449 Chronic obstructive pulmonary disease, unspecified: Secondary | ICD-10-CM | POA: Diagnosis present

## 2017-12-19 DIAGNOSIS — I5033 Acute on chronic diastolic (congestive) heart failure: Secondary | ICD-10-CM | POA: Diagnosis present

## 2017-12-19 DIAGNOSIS — E119 Type 2 diabetes mellitus without complications: Secondary | ICD-10-CM | POA: Diagnosis not present

## 2017-12-19 DIAGNOSIS — I272 Pulmonary hypertension, unspecified: Secondary | ICD-10-CM | POA: Diagnosis not present

## 2017-12-19 DIAGNOSIS — I10 Essential (primary) hypertension: Secondary | ICD-10-CM | POA: Diagnosis not present

## 2017-12-19 DIAGNOSIS — I509 Heart failure, unspecified: Secondary | ICD-10-CM

## 2017-12-19 DIAGNOSIS — R0902 Hypoxemia: Secondary | ICD-10-CM

## 2017-12-19 DIAGNOSIS — Z7982 Long term (current) use of aspirin: Secondary | ICD-10-CM | POA: Diagnosis not present

## 2017-12-19 DIAGNOSIS — Z7984 Long term (current) use of oral hypoglycemic drugs: Secondary | ICD-10-CM

## 2017-12-19 DIAGNOSIS — J9611 Chronic respiratory failure with hypoxia: Secondary | ICD-10-CM | POA: Diagnosis present

## 2017-12-19 DIAGNOSIS — E1142 Type 2 diabetes mellitus with diabetic polyneuropathy: Secondary | ICD-10-CM | POA: Diagnosis not present

## 2017-12-19 DIAGNOSIS — F4323 Adjustment disorder with mixed anxiety and depressed mood: Secondary | ICD-10-CM | POA: Diagnosis present

## 2017-12-19 DIAGNOSIS — G2581 Restless legs syndrome: Secondary | ICD-10-CM | POA: Diagnosis not present

## 2017-12-19 DIAGNOSIS — E785 Hyperlipidemia, unspecified: Secondary | ICD-10-CM | POA: Diagnosis not present

## 2017-12-19 DIAGNOSIS — Z79899 Other long term (current) drug therapy: Secondary | ICD-10-CM | POA: Diagnosis not present

## 2017-12-19 DIAGNOSIS — Z90710 Acquired absence of both cervix and uterus: Secondary | ICD-10-CM | POA: Diagnosis not present

## 2017-12-19 DIAGNOSIS — I110 Hypertensive heart disease with heart failure: Secondary | ICD-10-CM | POA: Diagnosis not present

## 2017-12-19 LAB — CBC
HCT: 37 % (ref 35.0–47.0)
HEMOGLOBIN: 11.6 g/dL — AB (ref 12.0–16.0)
MCH: 27.1 pg (ref 26.0–34.0)
MCHC: 31.5 g/dL — ABNORMAL LOW (ref 32.0–36.0)
MCV: 85.9 fL (ref 80.0–100.0)
Platelets: 182 10*3/uL (ref 150–440)
RBC: 4.31 MIL/uL (ref 3.80–5.20)
RDW: 14.9 % — ABNORMAL HIGH (ref 11.5–14.5)
WBC: 10.5 10*3/uL (ref 3.6–11.0)

## 2017-12-19 LAB — BASIC METABOLIC PANEL
ANION GAP: 8 (ref 5–15)
BUN: 25 mg/dL — ABNORMAL HIGH (ref 6–20)
CALCIUM: 9.2 mg/dL (ref 8.9–10.3)
CO2: 32 mmol/L (ref 22–32)
Chloride: 99 mmol/L — ABNORMAL LOW (ref 101–111)
Creatinine, Ser: 0.78 mg/dL (ref 0.44–1.00)
Glucose, Bld: 168 mg/dL — ABNORMAL HIGH (ref 65–99)
POTASSIUM: 3.7 mmol/L (ref 3.5–5.1)
Sodium: 139 mmol/L (ref 135–145)

## 2017-12-19 LAB — TROPONIN I: TROPONIN I: 0.03 ng/mL — AB (ref <0.03)

## 2017-12-19 LAB — BRAIN NATRIURETIC PEPTIDE: B NATRIURETIC PEPTIDE 5: 316 pg/mL — AB (ref 0.0–100.0)

## 2017-12-19 MED ORDER — ALBUTEROL SULFATE (2.5 MG/3ML) 0.083% IN NEBU
2.5000 mg | INHALATION_SOLUTION | Freq: Four times a day (QID) | RESPIRATORY_TRACT | Status: DC | PRN
  Administered 2017-12-19: 2.5 mg via RESPIRATORY_TRACT
  Filled 2017-12-19: qty 3

## 2017-12-19 MED ORDER — HYDROCHLOROTHIAZIDE 12.5 MG PO CAPS
12.5000 mg | ORAL_CAPSULE | Freq: Every day | ORAL | Status: DC
  Filled 2017-12-19: qty 1

## 2017-12-19 MED ORDER — IPRATROPIUM-ALBUTEROL 0.5-2.5 (3) MG/3ML IN SOLN
RESPIRATORY_TRACT | Status: AC
  Administered 2017-12-19: 3 mL via RESPIRATORY_TRACT
  Filled 2017-12-19: qty 6

## 2017-12-19 MED ORDER — FUROSEMIDE 10 MG/ML IJ SOLN
60.0000 mg | Freq: Once | INTRAMUSCULAR | Status: AC
  Administered 2017-12-19: 60 mg via INTRAVENOUS
  Filled 2017-12-19: qty 8

## 2017-12-19 MED ORDER — IRBESARTAN-HYDROCHLOROTHIAZIDE 300-12.5 MG PO TABS: 1.0000 | ORAL_TABLET | Freq: Every day | ORAL | Status: DC

## 2017-12-19 MED ORDER — ONDANSETRON HCL 4 MG/2ML IJ SOLN: 4.0000 mg | Freq: Four times a day (QID) | INTRAMUSCULAR | Status: DC | PRN

## 2017-12-19 MED ORDER — MOMETASONE FURO-FORMOTEROL FUM 200-5 MCG/ACT IN AERO
2.0000 | INHALATION_SPRAY | Freq: Two times a day (BID) | RESPIRATORY_TRACT | Status: DC
  Administered 2017-12-19: 2 via RESPIRATORY_TRACT
  Filled 2017-12-19: qty 8.8

## 2017-12-19 MED ORDER — MONTELUKAST SODIUM 10 MG PO TABS: 10.0000 mg | ORAL_TABLET | Freq: Every day | ORAL | Status: DC

## 2017-12-19 MED ORDER — SODIUM CHLORIDE 0.9% FLUSH
3.0000 mL | Freq: Two times a day (BID) | INTRAVENOUS | Status: DC
  Administered 2017-12-19: 3 mL via INTRAVENOUS

## 2017-12-19 MED ORDER — IPRATROPIUM-ALBUTEROL 0.5-2.5 (3) MG/3ML IN SOLN
3.0000 mL | Freq: Once | RESPIRATORY_TRACT | Status: AC
  Administered 2017-12-19: 3 mL via RESPIRATORY_TRACT

## 2017-12-19 MED ORDER — TIOTROPIUM BROMIDE MONOHYDRATE 18 MCG IN CAPS
18.0000 ug | ORAL_CAPSULE | Freq: Every day | RESPIRATORY_TRACT | Status: DC
  Administered 2017-12-19: 18 ug via RESPIRATORY_TRACT
  Filled 2017-12-19: qty 5

## 2017-12-19 MED ORDER — HYDRALAZINE HCL 50 MG PO TABS
50.0000 mg | ORAL_TABLET | Freq: Three times a day (TID) | ORAL | Status: DC
  Filled 2017-12-19: qty 1

## 2017-12-19 MED ORDER — ACETAMINOPHEN 325 MG PO TABS: 650.0000 mg | ORAL_TABLET | ORAL | Status: DC | PRN

## 2017-12-19 MED ORDER — METHYLPREDNISOLONE SODIUM SUCC 125 MG IJ SOLR
125.0000 mg | Freq: Once | INTRAMUSCULAR | Status: AC
  Administered 2017-12-19: 125 mg via INTRAVENOUS
  Filled 2017-12-19: qty 2

## 2017-12-19 MED ORDER — CLONIDINE HCL 0.1 MG PO TABS: 0.1000 mg | ORAL_TABLET | Freq: Two times a day (BID) | ORAL | 0 refills | Status: DC

## 2017-12-19 MED ORDER — SODIUM CHLORIDE 0.9% FLUSH: 3.0000 mL | INTRAVENOUS | Status: DC | PRN

## 2017-12-19 MED ORDER — SODIUM CHLORIDE 0.9 % IV SOLN: 250.0000 mL | INTRAVENOUS | Status: DC | PRN

## 2017-12-19 MED ORDER — CLONIDINE HCL 0.1 MG PO TABS
0.1000 mg | ORAL_TABLET | Freq: Three times a day (TID) | ORAL | Status: DC
Start: 2017-12-19 — End: 2017-12-19
  Administered 2017-12-19: 0.1 mg via ORAL
  Filled 2017-12-19: qty 1

## 2017-12-19 MED ORDER — POTASSIUM CHLORIDE ER 10 MEQ PO TBCR
10.0000 meq | EXTENDED_RELEASE_TABLET | Freq: Two times a day (BID) | ORAL | Status: DC
  Administered 2017-12-19: 10 meq via ORAL
  Filled 2017-12-19 (×3): qty 1

## 2017-12-19 MED ORDER — IRBESARTAN 150 MG PO TABS
300.0000 mg | ORAL_TABLET | Freq: Every day | ORAL | Status: DC
  Administered 2017-12-19: 300 mg via ORAL
  Filled 2017-12-19 (×2): qty 2

## 2017-12-19 MED ORDER — CARVEDILOL 25 MG PO TABS
25.0000 mg | ORAL_TABLET | Freq: Two times a day (BID) | ORAL | Status: DC
  Administered 2017-12-19: 25 mg via ORAL
  Filled 2017-12-19: qty 1

## 2017-12-19 MED ORDER — ASPIRIN EC 81 MG PO TBEC
81.0000 mg | DELAYED_RELEASE_TABLET | Freq: Every day | ORAL | Status: DC
  Administered 2017-12-19: 81 mg via ORAL
  Filled 2017-12-19: qty 1

## 2017-12-19 MED ORDER — METFORMIN HCL 500 MG PO TABS
500.0000 mg | ORAL_TABLET | Freq: Every day | ORAL | Status: DC
  Administered 2017-12-19: 500 mg via ORAL
  Filled 2017-12-19: qty 1

## 2017-12-19 MED ORDER — FUROSEMIDE 10 MG/ML IJ SOLN
60.0000 mg | Freq: Two times a day (BID) | INTRAMUSCULAR | Status: DC
  Administered 2017-12-19: 60 mg via INTRAVENOUS
  Filled 2017-12-19: qty 6

## 2017-12-19 MED ORDER — FUROSEMIDE 10 MG/ML IJ SOLN: 40.0000 mg | Freq: Two times a day (BID) | INTRAMUSCULAR | Status: DC

## 2017-12-19 MED ORDER — ASPIRIN EC 325 MG PO TBEC
325.0000 mg | DELAYED_RELEASE_TABLET | Freq: Every day | ORAL | Status: DC
Start: 2017-12-19 — End: 2017-12-19
  Administered 2017-12-19: 325 mg via ORAL
  Filled 2017-12-19: qty 1

## 2017-12-19 MED ORDER — GABAPENTIN 800 MG PO TABS
800.0000 mg | ORAL_TABLET | Freq: Three times a day (TID) | ORAL | Status: DC
  Filled 2017-12-19 (×2): qty 1

## 2017-12-19 MED ORDER — ENOXAPARIN SODIUM 40 MG/0.4ML ~~LOC~~ SOLN: 40.0000 mg | SUBCUTANEOUS | Status: DC

## 2017-12-19 MED ORDER — CLONAZEPAM 0.5 MG PO TABS: 0.5000 mg | ORAL_TABLET | Freq: Every day | ORAL | Status: DC

## 2017-12-19 MED ORDER — CLONIDINE HCL 0.1 MG PO TABS: 0.1000 mg | ORAL_TABLET | Freq: Two times a day (BID) | ORAL | Status: DC

## 2017-12-19 NOTE — ED Notes (Signed)
Transport to [id number]

## 2017-12-19 NOTE — ED Notes (Signed)
Pt placed on 3L upon arrival to tx room

## 2017-12-19 NOTE — Plan of Care (Signed)
  Progressing Education: Ability to demonstrate management of disease process will improve 12/19/2017 1205 - Progressing by Liliane Channel, RN Cardiac: Ability to achieve and maintain adequate cardiopulmonary perfusion will improve 12/19/2017 1205 - Progressing by Liliane Channel, RN Safety: Ability to remain free from injury will improve 12/19/2017 1205 - Progressing by Liliane Channel, RN

## 2017-12-19 NOTE — H&P (Signed)
[facility] [facility] [place] NAME: Megan Bolton    MR#:  [id number]  DATE OF BIRTH:  1941/08/12  DATE OF ADMISSION:  12/19/2017  PRIMARY CARE PHYSICIAN: Baxter Hire, MD   REQUESTING/REFERRING PHYSICIAN:   CHIEF COMPLAINT:   Chief Complaint  Patient presents with  . Shortness of Breath    HISTORY OF PRESENT ILLNESS: Megan Bolton  is a 76 y.o. female with a known history of diastolic heart failure, hyperlipidemia, hypertension, diabetes mellitus, COPD presented to the emergency room with increased shortness of breath for the last 2 days.  Patient uses oxygen via nasal cannula at 2 L at home.  But her O2 saturation was around 86% and she had to be increased to 4 L via nasal cannula in the emergency room.  She appeared congested she was given Lasix for diuresis.  Had some left-sided chest discomfort when she felt short of breath.  No nausea vomiting or diaphoresis.  Hospitalist service was consulted for further care.  PAST MEDICAL HISTORY:   Past Medical History:  Diagnosis Date  . COPD (chronic obstructive pulmonary disease) ([place])   . Diabetes mellitus without complication ([place])   . Diastolic heart failure ([place])   . Hyperlipidemia   . Hypertension     PAST SURGICAL HISTORY:  Past Surgical History:  Procedure Laterality Date  . ABDOMINAL HYSTERECTOMY    . SPINE SURGERY      SOCIAL HISTORY:  Social History   Tobacco Use  . Smoking status: Former Smoker    Packs/day: 0.50    Last attempt to quit: 04/28/2015    Years since quitting: 2.6  . Smokeless tobacco: Never Used  Substance Use Topics  . Alcohol use: No    Alcohol/week: 0.0 oz    FAMILY HISTORY:  Family History  Problem Relation Age of Onset  . Hypertension Mother   . Colon cancer Father   . Hypertension Paternal Grandfather     DRUG ALLERGIES:  Allergies  Allergen Reactions  . Nitrofurantoin Anaphylaxis    REVIEW OF SYSTEMS:   CONSTITUTIONAL: No fever,  fatigue or weakness.  EYES: No blurred or double vision.  EARS, NOSE, AND THROAT: No tinnitus or ear pain.  RESPIRATORY: No cough,has  shortness of breath,  No wheezing or hemoptysis.  CARDIOVASCULAR: No chest pain, orthopnea, edema.  GASTROINTESTINAL: No nausea, vomiting, diarrhea or abdominal pain.  GENITOURINARY: No dysuria, hematuria.  ENDOCRINE: No polyuria, nocturia,  HEMATOLOGY: No anemia, easy bruising or bleeding SKIN: No rash or lesion. MUSCULOSKELETAL: No joint pain or arthritis.   NEUROLOGIC: No tingling, numbness, weakness.  PSYCHIATRY: No anxiety or depression.   MEDICATIONS AT HOME:  Prior to Admission medications   Medication Sig Start Date End Date Taking? Authorizing Provider  ACCU-CHEK AVIVA PLUS test strip 1 each by Other route 2 (two) times daily. 09/08/17   Karamalegos, Devonne Doughty, DO  ACCU-CHEK SOFTCLIX LANCETS lancets 1 each by Other route 2 (two) times daily. 09/08/17   Karamalegos, Devonne Doughty, DO  albuterol (PROVENTIL) (2.5 MG/3ML) 0.083% nebulizer solution Take 3 mLs (2.5 mg total) by nebulization every 6 (six) hours as needed for wheezing. 12/01/16   Arlis Porta., MD  albuterol (VENTOLIN HFA) 108 (90 Base) MCG/ACT inhaler Inhale 2 puffs into the lungs every 6 (six) hours as needed for wheezing or shortness of breath. 05/12/17   Karamalegos, Devonne Doughty, DO  aspirin EC 325 MG tablet Take 325 mg by mouth daily.    [provider]  Blood Glucose Monitoring Suppl (ACCU-CHEK AVIVA PLUS) w/Device KIT USE AS DIRECTED 01/12/17   Arlis Porta., MD  carvedilol (COREG) 25 MG tablet Take 1 tablet (25 mg total) by mouth 2 (two) times daily. 11/24/17   Karamalegos, Devonne Doughty, DO  clonazePAM (KLONOPIN) 0.5 MG tablet 0.5 mg at bedtime.  07/04/15   [provider]  cloNIDine (CATAPRES) 0.1 MG tablet Take 1 tablet (0.1 mg total) by mouth 3 (three) times daily. Take 1 tab up to 3 times a day for any BPs >170.  Hold dose if BP lower than 170. 08/31/17    Bettey Costa, MD  doxycycline (VIBRAMYCIN) 100 MG capsule  08/26/17   [provider]  Fluticasone-Salmeterol (ADVAIR DISKUS) 250-50 MCG/DOSE AEPB Inhale 1 puff into the lungs 2 (two) times daily. 05/04/17   Karamalegos, Devonne Doughty, DO  furosemide (LASIX) 20 MG tablet Take 2 tablets (40 mg total) by mouth daily. 08/31/17   Bettey Costa, MD  gabapentin (NEURONTIN) 800 MG tablet Take 1 tablet (800 mg total) by mouth 3 (three) times daily. 12/01/16   Arlis Porta., MD  hydrALAZINE (APRESOLINE) 50 MG tablet Take 50 mg by mouth 3 (three) times daily.  08/06/17   [provider]  irbesartan-hydrochlorothiazide (AVALIDE) 300-12.5 MG tablet Take 1 tablet by mouth daily.  08/06/17   [provider]  metFORMIN (GLUCOPHAGE) 500 MG tablet TAKE ONE TABLET BY MOUTH EVERY MORNING WITH BREAKFAST 03/24/17   Arlis Porta., MD  montelukast (SINGULAIR) 10 MG tablet TAKE ONE TABLET BY MOUTH AT BEDTIME 03/24/17   Arlis Porta., MD  MOVANTIK 12.5 MG TABS tablet  09/02/17   [provider]  OXYCONTIN 10 MG 12 hr tablet Take 10 mg by mouth 2 (two) times daily.    [provider]  OXYGEN Inhale 2 L into the lungs continuous.    [provider]  potassium chloride (K-DUR) 10 MEQ tablet Take 1 tablet (10 mEq total) by mouth 2 (two) times daily. 10/12/17   Karamalegos, Devonne Doughty, DO  tiotropium (SPIRIVA HANDIHALER) 18 MCG inhalation capsule Place 1 capsule (18 mcg total) into inhaler and inhale daily. 08/27/17   Karamalegos, Devonne Doughty, DO  valACYclovir (VALTREX) 1000 MG tablet Take 1 tablet (1,000 mg total) by mouth daily as needed (for fever blisters). 04/19/16   Gladstone Lighter, MD      PHYSICAL EXAMINATION:   VITAL SIGNS: Blood pressure (!) 161/87, pulse 70, temperature 98.6 F (37 C), temperature source Oral, resp. rate 17, height 5' 5"  (1.651 m), weight 98.4 kg (217 lb), SpO2 94 %.  GENERAL:  76 y.o.-year-old patient lying in the bed with no acute  distress on oxygen via nasal canula. EYES: Pupils equal, round, reactive to light and accommodation. No scleral icterus. Extraocular muscles intact.  HEENT: Head atraumatic, normocephalic. Oropharynx and nasopharynx clear.  NECK:  Supple, no jugular venous distention. No thyroid enlargement, no tenderness.  LUNGS: Decreased breath sounds bilaterally, basal crepitations heard bilaterally. No use of accessory muscles of respiration.  CARDIOVASCULAR: S1, S2 normal. No murmurs, rubs, or gallops.  ABDOMEN: Soft, nontender, nondistended. Bowel sounds present. No organomegaly or mass.  EXTREMITIES: No pedal edema, cyanosis, or clubbing.  NEUROLOGIC: Cranial nerves II through XII are intact. Muscle strength 5/5 in all extremities. Sensation intact. Gait not checked.  PSYCHIATRIC: The patient is alert and oriented x 3.  SKIN: No obvious rash, lesion, or ulcer.   LABORATORY PANEL:   CBC Recent Labs  Lab 12/19/17 0225  WBC 10.5  HGB 11.6*  HCT 37.0  PLT 182  MCV 85.9  MCH 27.1  MCHC 31.5*  RDW 14.9*   ------------------------------------------------------------------------------------------------------------------  Chemistries  Recent Labs  Lab 12/19/17 0225  NA 139  K 3.7  CL 99*  CO2 32  GLUCOSE 168*  BUN 25*  CREATININE 0.78  CALCIUM 9.2   ------------------------------------------------------------------------------------------------------------------ estimated creatinine clearance is 69.5 mL/min (by C-G formula based on SCr of 0.78 mg/dL). ------------------------------------------------------------------------------------------------------------------ No results for input(s): TSH, T4TOTAL, T3FREE, THYROIDAB in the last 72 hours.  Invalid input(s): FREET3   Coagulation profile No results for input(s): INR, PROTIME in the last 168 hours. ------------------------------------------------------------------------------------------------------------------- No results for  input(s): DDIMER in the last 72 hours. -------------------------------------------------------------------------------------------------------------------  Cardiac Enzymes Recent Labs  Lab 12/19/17 0225  TROPONINI 0.03*   ------------------------------------------------------------------------------------------------------------------ Invalid input(s): POCBNP  ---------------------------------------------------------------------------------------------------------------  Urinalysis    Component Value Date/Time   COLORURINE YELLOW (A) 06/03/2017 1257   APPEARANCEUR HAZY (A) 06/03/2017 1257   APPEARANCEUR Cloudy 09/19/2014 1157   LABSPEC 1.014 06/03/2017 1257   LABSPEC 1.017 09/19/2014 1157   PHURINE 6.0 06/03/2017 1257   GLUCOSEU NEGATIVE 06/03/2017 1257   GLUCOSEU Negative 09/19/2014 1157   HGBUR NEGATIVE 06/03/2017 1257   BILIRUBINUR NEGATIVE 06/03/2017 1257   BILIRUBINUR neg 12/16/2016 1118   BILIRUBINUR Negative 09/19/2014 1157   KETONESUR NEGATIVE 06/03/2017 1257   PROTEINUR NEGATIVE 06/03/2017 1257   UROBILINOGEN 0.2 12/16/2016 1118   NITRITE NEGATIVE 06/03/2017 1257   LEUKOCYTESUR NEGATIVE 06/03/2017 1257   LEUKOCYTESUR Negative 09/19/2014 1157     RADIOLOGY: Dg Chest 2 View  Result Date: 12/19/2017 CLINICAL DATA:  Shortness of breath EXAM: CHEST  2 VIEW COMPARISON:  Chest radiograph 08/31/2017 FINDINGS: Cardiomegaly and calcific aortic atherosclerosis with moderate interstitial pulmonary edema, unchanged. No pleural effusion. Improved aeration of the right lung base. IMPRESSION: 1. Unchanged cardiomegaly and mild interstitial pulmonary edema. 2. Improved aeration of the right lung base. 3. Aortic Atherosclerosis (ICD10-I70.0). Electronically Signed   By: Ulyses Jarred M.D.   On: 12/19/2017 03:20    EKG: Orders placed or performed during the hospital encounter of 12/19/17  . EKG 12-Lead  . EKG 12-Lead  . ED EKG  . ED EKG    IMPRESSION AND PLAN: 76 year old  female patient with history of diastolic heart failure, diabetes mellitus, COPD, hyperlipidemia, hypertension presented to the emergency room with increased shortness of breath.  Admitting diagnosis 1.  Decompensated heart failure 2.  Hypoxia 3.  Emphysema 4.  Diabetes mellitus type 2 Treatment plan Admit patient to telemetry Diurese patient with IV Lasix Increase oxygen to 4 L via nasal cannula Resume cardiac medications Medical management for diabetes mellitus Cycle troponin and check echocardiogram  All the records are reviewed and case discussed with ED provider. Management plans discussed with the patient, family and they are in agreement.  CODE STATUS:FULL CODE Code Status History    Date Active Date Inactive Code Status Order ID Comments User Context   08/30/2017 10:21 08/31/2017 15:00 Full Code [id number]  Bettey Costa, MD Inpatient   04/18/2016 01:42 04/19/2016 17:32 Full Code [id number]  Lance Coon, MD ED   02/28/2016 05:40 03/01/2016 17:39 Full Code [id number]  Saundra Shelling, MD Inpatient    Advance Directive Documentation     Most Recent Value  Type of Advance Directive  Healthcare Power of Attorney  Pre-existing out of facility DNR order (yellow form or pink MOST form)  No data  "MOST" Form in Place?  No data  TOTAL TIME TAKING CARE OF THIS PATIENT: 50 minutes.    Saundra Shelling M.D on 12/19/2017 at 3:58 AM  Between 7am to 6pm - Pager - [phone]  After 6pm go to [email or web address] - password [email or web address] [facility]  Office  [phone]  CC: Primary care physician; Baxter Hire, MD

## 2017-12-19 NOTE — ED Triage Notes (Signed)
Pt reports shortness of breath for the past 2 days reporst tonight shortness of breath has increased. Pt has history of COPD wear oxygen 2L/[place] at all times, upon arrival to triage oxygen saturation 88% in 2l / [place], pt denies any other symptom

## 2017-12-19 NOTE — Progress Notes (Signed)
Pt husband was a little bit concern on her wife temp. This morning her temp was at 99.8. This afternoon at 14:10 it was 99.1. I notify doctor Tressia Miners and no order was place, but told me to offer Tylenol if pt is okay taking it. Will continue to monitor.

## 2017-12-19 NOTE — ED Provider Notes (Signed)
[facility] Emergency Department Provider Note  Time seen: 2:28 AM  I have reviewed the triage vital signs and the nursing notes.   HISTORY  Chief Complaint Shortness of Breath    HPI Megan Bolton is a 76 y.o. female with a past medical history of COPD on 2 L of oxygen 24/7, diabetes, hypertension, hyperlipidemia, CHF, presents to the emergency department for difficulty breathing times 2 days.  According to the patient for the past 2 days she has been experiencing increased shortness of breath.  States some mild left-sided chest discomfort.  Denies any nausea or vomiting.  Denies any diaphoresis.  Upon arrival the patient is satting 87% on 2 L of oxygen, maintaining in the low to mid 90s on 4 L.  Patient denies any fever, denies any increased cough or any congestion.   Past Medical History:  Diagnosis Date  . COPD (chronic obstructive pulmonary disease) ([place])   . Diabetes mellitus without complication ([place])   . Diastolic heart failure ([place])   . Hyperlipidemia   . Hypertension     Patient Active Problem List   Diagnosis Date Noted  . Restless leg syndrome 09/07/2017  . CHF exacerbation ([place]) 08/30/2017  . Adjustment disorder with mixed anxiety and depressed mood 08/13/2017  . Peripheral neuropathy 07/14/2017  . Malignant hypertension 01/29/2017  . Chronic respiratory failure with hypoxia, on home O2 therapy ([place]) 12/24/2016  . Incomplete emptying of bladder 07/19/2016  . Bacteriuria 07/17/2016  . Infection due to ESBL-producing Escherichia coli 04/21/2016  . Frequent UTI 04/21/2016  . OSA on CPAP 04/18/2016  . Bladder infection, chronic 04/17/2016  . Calculus of kidney 04/17/2016  . Community acquired pneumonia 02/28/2016  . [name] (chronic kidney disease) 07/12/2015  . Controlled type 2 diabetes mellitus with diabetic neuropathy, without long-term current use of insulin ([place]) 06/11/2015  . Arthritis, degenerative 06/11/2015  . Hypercholesteremia  06/11/2015  . 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 Injury of kidney 04/14/2015  . Essential hypertension 04/14/2015  . Blood glucose elevated 04/14/2015  . Apnea, sleep 04/14/2015  . COPD (chronic obstructive pulmonary disease) ([place]) 04/14/2015  . DDD (degenerative disc disease), lumbar 08/01/2014  . Lumbar canal stenosis 05/11/2014  . Neuritis or radiculitis due to rupture of lumbar intervertebral disc 05/11/2014  . 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 Lumbosacral spondylosis 05/11/2014  . Thoracic and lumbosacral neuritis 05/11/2014  . Accelerating angina ([place]) 03/13/2014    Past Surgical History:  Procedure Laterality Date  . ABDOMINAL HYSTERECTOMY    . SPINE SURGERY      Prior to Admission medications   Medication Sig Start Date End Date Taking? Authorizing Provider  ACCU-CHEK AVIVA PLUS test strip 1 each by Other route 2 (two) times daily. 09/08/17   Karamalegos, Devonne Doughty, DO  ACCU-CHEK SOFTCLIX LANCETS lancets 1 each by Other route 2 (two) times daily. 09/08/17   Karamalegos, Devonne Doughty, DO  albuterol (PROVENTIL) (2.5 MG/3ML) 0.083% nebulizer solution Take 3 mLs (2.5 mg total) by nebulization every 6 (six) hours as needed for wheezing. 12/01/16   Arlis Porta., MD  albuterol (VENTOLIN HFA) 108 (90 Base) MCG/ACT inhaler Inhale 2 puffs into the lungs every 6 (six) hours as needed for wheezing or shortness of breath. 05/12/17   Karamalegos, Devonne Doughty, DO  aspirin EC 325 MG tablet Take 325 mg by mouth daily.    [provider]  Blood Glucose Monitoring Suppl (ACCU-CHEK AVIVA PLUS) w/Device KIT USE AS DIRECTED 01/12/17  Arlis Porta., MD  carvedilol (COREG) 25 MG tablet Take 1 tablet (25 mg total) by mouth 2 (two) times daily. 11/24/17   Karamalegos, Devonne Doughty, DO  clonazePAM (KLONOPIN) 0.5 MG tablet 0.5 mg at bedtime.  07/04/15   [provider]  cloNIDine (CATAPRES) 0.1 MG tablet Take 1 tablet (0.1 mg total)  by mouth 3 (three) times daily. Take 1 tab up to 3 times a day for any BPs >170.  Hold dose if BP lower than 170. 08/31/17   Bettey Costa, MD  doxycycline (VIBRAMYCIN) 100 MG capsule  08/26/17   [provider]  Fluticasone-Salmeterol (ADVAIR DISKUS) 250-50 MCG/DOSE AEPB Inhale 1 puff into the lungs 2 (two) times daily. 05/04/17   Karamalegos, Devonne Doughty, DO  furosemide (LASIX) 20 MG tablet Take 2 tablets (40 mg total) by mouth daily. 08/31/17   Bettey Costa, MD  gabapentin (NEURONTIN) 800 MG tablet Take 1 tablet (800 mg total) by mouth 3 (three) times daily. 12/01/16   Arlis Porta., MD  hydrALAZINE (APRESOLINE) 50 MG tablet Take 50 mg by mouth 3 (three) times daily.  08/06/17   [provider]  irbesartan-hydrochlorothiazide (AVALIDE) 300-12.5 MG tablet Take 1 tablet by mouth daily.  08/06/17   [provider]  metFORMIN (GLUCOPHAGE) 500 MG tablet TAKE ONE TABLET BY MOUTH EVERY MORNING WITH BREAKFAST 03/24/17   Arlis Porta., MD  montelukast (SINGULAIR) 10 MG tablet TAKE ONE TABLET BY MOUTH AT BEDTIME 03/24/17   Arlis Porta., MD  MOVANTIK 12.5 MG TABS tablet  09/02/17   [provider]  OXYCONTIN 10 MG 12 hr tablet Take 10 mg by mouth 2 (two) times daily.    [provider]  OXYGEN Inhale 2 L into the lungs continuous.    [provider]  potassium chloride (K-DUR) 10 MEQ tablet Take 1 tablet (10 mEq total) by mouth 2 (two) times daily. 10/12/17   Karamalegos, Devonne Doughty, DO  tiotropium (SPIRIVA HANDIHALER) 18 MCG inhalation capsule Place 1 capsule (18 mcg total) into inhaler and inhale daily. 08/27/17   Karamalegos, Devonne Doughty, DO  valACYclovir (VALTREX) 1000 MG tablet Take 1 tablet (1,000 mg total) by mouth daily as needed (for fever blisters). 04/19/16   Gladstone Lighter, MD    Allergies  Allergen Reactions  . Nitrofurantoin Anaphylaxis    Family History  Problem Relation Age of Onset  . Hypertension Mother   . Colon cancer  Father   . Hypertension Paternal Grandfather     Social History Social History   Tobacco Use  . Smoking status: Former Smoker    Packs/day: 0.50    Last attempt to quit: 04/28/2015    Years since quitting: 2.6  . Smokeless tobacco: Never Used  Substance Use Topics  . Alcohol use: No    Alcohol/week: 0.0 oz  . Drug use: No    Review of Systems Constitutional: Negative for fever. Cardiovascular: Mild left chest discomfort Respiratory: Positive for shortness of breath.  Negative for cough Gastrointestinal: States some abdominal fullness but denies any pain, nausea vomiting or diarrhea. Musculoskeletal: No leg pain or swelling All other ROS negative  ____________________________________________   PHYSICAL EXAM:  VITAL SIGNS: ED Triage Vitals [12/19/17 0216]  Enc Vitals Group     BP 139/70     Pulse Rate 72     Resp 20     Temp 98.6 F (37 C)     Temp Source Oral     SpO2 Marland Kitchen)  88 %     Weight 217 lb (98.4 kg)     Height 5' 5" (1.651 m)     Head Circumference      Peak Flow      Pain Score      Pain Loc      Pain Edu?      Excl. in [place]?    Constitutional: Alert and oriented. Well appearing and in no distress. Eyes: Normal exam ENT   Head: Normocephalic and atraumatic.   Mouth/Throat: Mucous membranes are moist. Cardiovascular: Normal rate, regular rhythm. No murmur Respiratory: Mild tachypnea, decreased breath sounds bilaterally.  No obvious wheeze rales or rhonchi but moving very little air currently.  Mild left chest wall discomfort to palpation.   Gastrointestinal: Soft and nontender. No distention.  Musculoskeletal: Nontender with normal range of motion in all extremities.  No lower extremity edema or tenderness Neurologic:  Normal speech and language. No gross focal neurologic deficits  Skin:  Skin is warm, dry and intact.  Psychiatric: Mood and affect are normal.   ____________________________________________    EKG  EKG reviewed and interpreted  by myself shows normal sinus rhythm at 74 bpm with a narrow QRS, normal axis, normal intervals, no ST changes.  Normal EKG  ____________________________________________    RADIOLOGY  Chest x-ray shows interstitial pulmonary edema  ____________________________________________   INITIAL IMPRESSION / ASSESSMENT AND PLAN / ED COURSE  Pertinent labs & imaging results that were available during my care of the patient were reviewed by me and considered in my medical decision making (see chart for details).  Patient presents to the emergency department for 2 days of shortness of breath, hypoxic on her normal 2 L of oxygen.  Differential would include CHF, COPD exacerbation, pneumonia, pneumothorax.  We will check labs, chest x-ray, treat with DuoNeb's, Solu-Medrol and continue to closely monitor in the emergency department.  Patient's labs show an elevated BNP 316, troponin 0.03.  X-ray consistent with interstitial pulmonary edema.  Suspect likely CHF exacerbation we will treat with IV Lasix and continue to closely monitor.  Patient will be admitted to the hospital for further treatment.  ____________________________________________   FINAL CLINICAL IMPRESSION(S) / ED DIAGNOSES  Dyspnea Hypoxia CHF exacerbation   Harvest Dark, MD 12/19/17 [phone]

## 2017-12-19 NOTE — Progress Notes (Signed)
Pt d/c to home today.  IV removed intact.  D/c paperwork printed and reviewed w/pt.  All medication questions and concerns reviewed and pt states understanding.  All Rx's given to patient. Pt requested to wheelchaired out for d/c.    

## 2017-12-19 NOTE — Discharge Instructions (Signed)
Take lasix 80mg  daily for 2 days and then go back to taking 40 mg daily.   - Daily fluids < 2 liters. - Low salt diet - Check weight everyday and keep log. Take to your doctors appt. - Take extra dose of lasix if you gain more than 3 pounds weight.

## 2017-12-20 DIAGNOSIS — J449 Chronic obstructive pulmonary disease, unspecified: Secondary | ICD-10-CM | POA: Diagnosis not present

## 2017-12-31 DIAGNOSIS — Z1211 Encounter for screening for malignant neoplasm of colon: Secondary | ICD-10-CM | POA: Diagnosis not present

## 2018-01-06 NOTE — Discharge Summary (Signed)
[place] [facility] [place] NAME: Megan Bolton    MR#:  [id number]  DATE OF BIRTH:  November 30, 1941  DATE OF ADMISSION:  12/19/2017 ADMITTING PHYSICIAN: Saundra Shelling, MD  DATE OF DISCHARGE: 12/19/2017  2:30 PM  PRIMARY CARE PHYSICIAN: Baxter Hire, MD   ADMISSION DIAGNOSIS:  SOB (shortness of breath) [R06.02] Hypoxia [R09.02] Acute on chronic congestive heart failure, unspecified heart failure type ([place]) [I50.9]  DISCHARGE DIAGNOSIS:  Active Problems:   CHF (congestive heart failure) ([place])   SECONDARY DIAGNOSIS:   Past Medical History:  Diagnosis Date  . COPD (chronic obstructive pulmonary disease) ([place])   . Diabetes mellitus without complication ([place])   . Diastolic heart failure ([place])   . Hyperlipidemia   . Hypertension      ADMITTING HISTORY  ISTORY OF PRESENT ILLNESS: Megan Bolton  is a 77 y.o. female with a known history of diastolic heart failure, hyperlipidemia, hypertension, diabetes mellitus, COPD presented to the emergency room with increased shortness of breath for the last 2 days.  Patient uses oxygen via nasal cannula at 2 L at home.  But her O2 saturation was around 86% and she had to be increased to 4 L via nasal cannula in the emergency room.  She appeared congested she was given Lasix for diuresis.  Had some left-sided chest discomfort when she felt short of breath.  No nausea vomiting or diaphoresis.  Hospitalist service was consulted for further care.  HOSPITAL COURSE:   * Acute om chronic diastolic chf * Acute on chronic resp failure * COPD DM  Patient admitted to tel unit and tsrated on IV lasix,Nebs and diuresed well. She was weaned down on O2. Improved well with returning to baseline quicker than expected and requested discharge home.  Home lasix dose increased. Patient counseled regarding fluid and salt intake, daily weight checks.  Stable for discharge to home  CONSULTS OBTAINED:    DRUG ALLERGIES:    Allergies  Allergen Reactions  . Nitrofurantoin Anaphylaxis    DISCHARGE MEDICATIONS:   Allergies as of 12/19/2017      Reactions   Nitrofurantoin Anaphylaxis      Medication List    STOP taking these medications   hydrALAZINE 50 MG tablet Commonly known as:  APRESOLINE     TAKE these medications   ACCU-CHEK AVIVA PLUS test strip Generic drug:  glucose blood 1 each by Other route 2 (two) times daily.   ACCU-CHEK AVIVA PLUS w/Device Kit USE AS DIRECTED   ACCU-CHEK SOFTCLIX LANCETS lancets 1 each by Other route 2 (two) times daily.   albuterol (2.5 MG/3ML) 0.083% nebulizer solution Commonly known as:  PROVENTIL Take 3 mLs (2.5 mg total) by nebulization every 6 (six) hours as needed for wheezing.   albuterol 108 (90 Base) MCG/ACT inhaler Commonly known as:  VENTOLIN HFA Inhale 2 puffs into the lungs every 6 (six) hours as needed for wheezing or shortness of breath.   aspirin EC 325 MG tablet Take 325 mg by mouth daily.   carvedilol 25 MG tablet Commonly known as:  COREG Take 1 tablet (25 mg total) by mouth 2 (two) times daily.   clonazePAM 0.5 MG tablet Commonly known as:  KLONOPIN 0.5 mg at bedtime.   cloNIDine 0.1 MG tablet Commonly known as:  CATAPRES Take 1 tablet (0.1 mg total) by mouth 2 (two) times daily. Take 1 tab up to 3 times a day for any BPs >170.  Hold dose if BP lower than  170. What changed:  when to take this   doxycycline 100 MG capsule Commonly known as:  VIBRAMYCIN   Fluticasone-Salmeterol 250-50 MCG/DOSE Aepb Commonly known as:  ADVAIR DISKUS Inhale 1 puff into the lungs 2 (two) times daily.   furosemide 20 MG tablet Commonly known as:  LASIX Take 2 tablets (40 mg total) by mouth daily.   gabapentin 800 MG tablet Commonly known as:  NEURONTIN Take 1 tablet (800 mg total) by mouth 3 (three) times daily.   irbesartan-hydrochlorothiazide 300-12.5 MG tablet Commonly known as:  AVALIDE Take 1 tablet by mouth daily.   metFORMIN  500 MG tablet Commonly known as:  GLUCOPHAGE TAKE ONE TABLET BY MOUTH EVERY MORNING WITH BREAKFAST   montelukast 10 MG tablet Commonly known as:  SINGULAIR TAKE ONE TABLET BY MOUTH AT BEDTIME   MOVANTIK 12.5 MG Tabs tablet Generic drug:  naloxegol oxalate   OXYCONTIN 10 mg 12 hr tablet Generic drug:  oxyCODONE Take 10 mg by mouth 2 (two) times daily.   OXYGEN Inhale 2 L into the lungs continuous.   potassium chloride 10 MEQ tablet Commonly known as:  K-DUR Take 1 tablet (10 mEq total) by mouth 2 (two) times daily.   tiotropium 18 MCG inhalation capsule Commonly known as:  SPIRIVA HANDIHALER Place 1 capsule (18 mcg total) into inhaler and inhale daily.   valACYclovir 1000 MG tablet Commonly known as:  VALTREX Take 1 tablet (1,000 mg total) by mouth daily as needed (for fever blisters).       Today   VITAL SIGNS:  Blood pressure (!) 146/77, pulse 74, temperature 97.7 F (36.5 C), temperature source Oral, resp. rate 17, height 5' 5"  (1.651 m), weight 98.4 kg (217 lb), SpO2 96 %.  I/O:  No intake or output data in the 24 hours ending 01/06/18 2318  PHYSICAL EXAMINATION:  Physical Exam  GENERAL:  77 y.o.-year-old patient lying in the bed with no acute distress.  LUNGS: Normal breath sounds bilaterally, no wheezing, rales,rhonchi or crepitation. No use of accessory muscles of respiration.  CARDIOVASCULAR: S1, S2 normal. No murmurs, rubs, or gallops.  ABDOMEN: Soft, non-tender, non-distended. Bowel sounds present. No organomegaly or mass.  NEUROLOGIC: Moves all 4 extremities. PSYCHIATRIC: The patient is alert and oriented x 3.  SKIN: No obvious rash, lesion, or ulcer.   DATA REVIEW:   CBC No results for input(s): WBC, HGB, HCT, PLT in the last 168 hours.  Chemistries  No results for input(s): NA, K, CL, CO2, GLUCOSE, BUN, CREATININE, CALCIUM, MG, AST, ALT, ALKPHOS, BILITOT in the last 168 hours.  Invalid input(s): GFRCGP  Cardiac Enzymes No results for  input(s): TROPONINI in the last 168 hours.  Microbiology Results  Results for orders placed or performed during the hospital encounter of 08/30/17  Blood culture (routine x 2)     Status: None   Collection Time: 08/30/17  8:22 AM  Result Value Ref Range Status   Specimen Description BLOOD LEFT ANTECUBITAL  Final   Special Requests   Final    BOTTLES DRAWN AEROBIC AND ANAEROBIC Blood Culture adequate volume   Culture NO GROWTH 5 DAYS  Final   Report Status 09/04/2017 FINAL  Final  Blood culture (routine x 2)     Status: None   Collection Time: 08/30/17  8:27 AM  Result Value Ref Range Status   Specimen Description BLOOD BLOOD LEFT HAND  Final   Special Requests   Final    BOTTLES DRAWN AEROBIC AND ANAEROBIC Blood Culture adequate  volume   Culture NO GROWTH 5 DAYS  Final   Report Status 09/04/2017 FINAL  Final    RADIOLOGY:  No results found.  Follow up with PCP in 1 week.  Management plans discussed with the patient, family and they are in agreement.  CODE STATUS:  Code Status History    Date Active Date Inactive Code Status Order ID Comments User Context   12/19/2017 05:14 12/19/2017 18:13 Full Code [id number]  Saundra Shelling, MD Inpatient   08/30/2017 10:21 08/31/2017 15:00 Full Code [id number]  Bettey Costa, MD Inpatient   04/18/2016 01:42 04/19/2016 17:32 Full Code [id number]  Lance Coon, MD ED   02/28/2016 05:40 03/01/2016 17:39 Full Code [id number]  Saundra Shelling, MD Inpatient    Advance Directive Documentation     Most Recent Value  Type of Advance Directive  Living will  Pre-existing out of facility DNR order (yellow form or pink MOST form)  No data  "MOST" Form in Place?  No data      TOTAL TIME TAKING CARE OF THIS PATIENT ON DAY OF DISCHARGE: more than 30 minutes.   Leia Alf Lanard Arguijo M.D on 01/06/2018 at 11:18 PM  Between 7am to 6pm - Pager - [phone]  After 6pm go to [email or web address] - password [email or web address] [facility]  Office   [phone]  CC: Primary care physician; Baxter Hire, MD  Note: This dictation was prepared with Dragon dictation along with smaller phrase technology. Any transcriptional errors that result from this process are unintentional.

## 2018-01-09 DIAGNOSIS — J449 Chronic obstructive pulmonary disease, unspecified: Secondary | ICD-10-CM | POA: Diagnosis not present

## 2018-01-09 DIAGNOSIS — J438 Other emphysema: Secondary | ICD-10-CM | POA: Diagnosis not present

## 2018-01-13 ENCOUNTER — Telehealth: Payer: Self-pay | Admitting: Internal Medicine

## 2018-01-13 NOTE — Telephone Encounter (Deleted)
Drug store called  [facility] requesting shingle vaccine sent to the drug store.

## 2018-01-17 DIAGNOSIS — Z5181 Encounter for therapeutic drug level monitoring: Secondary | ICD-10-CM | POA: Diagnosis not present

## 2018-01-17 DIAGNOSIS — I739 Peripheral vascular disease, unspecified: Secondary | ICD-10-CM | POA: Diagnosis not present

## 2018-01-20 DIAGNOSIS — J449 Chronic obstructive pulmonary disease, unspecified: Secondary | ICD-10-CM | POA: Diagnosis not present

## 2018-01-24 DIAGNOSIS — Z79899 Other long term (current) drug therapy: Secondary | ICD-10-CM | POA: Diagnosis not present

## 2018-01-24 DIAGNOSIS — F1120 Opioid dependence, uncomplicated: Secondary | ICD-10-CM | POA: Diagnosis not present

## 2018-01-24 DIAGNOSIS — M5416 Radiculopathy, lumbar region: Secondary | ICD-10-CM | POA: Diagnosis not present

## 2018-01-24 DIAGNOSIS — G894 Chronic pain syndrome: Secondary | ICD-10-CM | POA: Diagnosis not present

## 2018-01-25 DIAGNOSIS — I739 Peripheral vascular disease, unspecified: Secondary | ICD-10-CM | POA: Diagnosis not present

## 2018-01-31 DIAGNOSIS — R809 Proteinuria, unspecified: Secondary | ICD-10-CM | POA: Diagnosis not present

## 2018-01-31 DIAGNOSIS — I10 Essential (primary) hypertension: Secondary | ICD-10-CM | POA: Diagnosis not present

## 2018-01-31 DIAGNOSIS — E1122 Type 2 diabetes mellitus with diabetic chronic kidney disease: Secondary | ICD-10-CM | POA: Diagnosis not present

## 2018-01-31 DIAGNOSIS — N183 Chronic kidney disease, stage 3 (moderate): Secondary | ICD-10-CM | POA: Diagnosis not present

## 2018-02-09 DIAGNOSIS — J449 Chronic obstructive pulmonary disease, unspecified: Secondary | ICD-10-CM | POA: Diagnosis not present

## 2018-02-09 DIAGNOSIS — J438 Other emphysema: Secondary | ICD-10-CM | POA: Diagnosis not present

## 2018-02-20 DIAGNOSIS — J449 Chronic obstructive pulmonary disease, unspecified: Secondary | ICD-10-CM | POA: Diagnosis not present

## 2018-02-21 DIAGNOSIS — Z79899 Other long term (current) drug therapy: Secondary | ICD-10-CM | POA: Diagnosis not present

## 2018-02-21 DIAGNOSIS — M5416 Radiculopathy, lumbar region: Secondary | ICD-10-CM | POA: Diagnosis not present

## 2018-02-21 DIAGNOSIS — F1120 Opioid dependence, uncomplicated: Secondary | ICD-10-CM | POA: Diagnosis not present

## 2018-02-21 DIAGNOSIS — G894 Chronic pain syndrome: Secondary | ICD-10-CM | POA: Diagnosis not present

## 2018-03-08 DIAGNOSIS — G4733 Obstructive sleep apnea (adult) (pediatric): Secondary | ICD-10-CM | POA: Diagnosis not present

## 2018-03-09 DIAGNOSIS — J439 Emphysema, unspecified: Secondary | ICD-10-CM | POA: Diagnosis not present

## 2018-03-09 DIAGNOSIS — I10 Essential (primary) hypertension: Secondary | ICD-10-CM | POA: Diagnosis not present

## 2018-03-09 DIAGNOSIS — J438 Other emphysema: Secondary | ICD-10-CM | POA: Diagnosis not present

## 2018-03-09 DIAGNOSIS — Z0000 Encounter for general adult medical examination without abnormal findings: Secondary | ICD-10-CM | POA: Diagnosis not present

## 2018-03-09 DIAGNOSIS — K219 Gastro-esophageal reflux disease without esophagitis: Secondary | ICD-10-CM | POA: Diagnosis not present

## 2018-03-09 DIAGNOSIS — E7800 Pure hypercholesterolemia, unspecified: Secondary | ICD-10-CM | POA: Diagnosis not present

## 2018-03-09 DIAGNOSIS — Z1329 Encounter for screening for other suspected endocrine disorder: Secondary | ICD-10-CM | POA: Diagnosis not present

## 2018-03-09 DIAGNOSIS — J449 Chronic obstructive pulmonary disease, unspecified: Secondary | ICD-10-CM | POA: Diagnosis not present

## 2018-03-09 DIAGNOSIS — E119 Type 2 diabetes mellitus without complications: Secondary | ICD-10-CM | POA: Diagnosis not present

## 2018-03-20 DIAGNOSIS — J449 Chronic obstructive pulmonary disease, unspecified: Secondary | ICD-10-CM | POA: Diagnosis not present

## 2018-03-21 DIAGNOSIS — G894 Chronic pain syndrome: Secondary | ICD-10-CM | POA: Diagnosis not present

## 2018-03-21 DIAGNOSIS — M5416 Radiculopathy, lumbar region: Secondary | ICD-10-CM | POA: Diagnosis not present

## 2018-03-21 DIAGNOSIS — Z79899 Other long term (current) drug therapy: Secondary | ICD-10-CM | POA: Diagnosis not present

## 2018-03-21 DIAGNOSIS — F1120 Opioid dependence, uncomplicated: Secondary | ICD-10-CM | POA: Diagnosis not present

## 2018-04-08 ENCOUNTER — Emergency Department: Payer: Medicare HMO

## 2018-04-08 ENCOUNTER — Encounter: Payer: Self-pay | Admitting: Emergency Medicine

## 2018-04-08 ENCOUNTER — Emergency Department
Admission: EM | Admit: 2018-04-08 | Discharge: 2018-04-08 | Disposition: A | Discharge status: Home / Self Care | Payer: Medicare HMO | Attending: Emergency Medicine | Admitting: Emergency Medicine

## 2018-04-08 ENCOUNTER — Other Ambulatory Visit: Payer: Self-pay | Admitting: Family Medicine

## 2018-04-08 DIAGNOSIS — J441 Chronic obstructive pulmonary disease with (acute) exacerbation: Secondary | ICD-10-CM | POA: Diagnosis not present

## 2018-04-08 DIAGNOSIS — E1122 Type 2 diabetes mellitus with diabetic chronic kidney disease: Secondary | ICD-10-CM | POA: Diagnosis not present

## 2018-04-08 DIAGNOSIS — I5030 Unspecified diastolic (congestive) heart failure: Secondary | ICD-10-CM | POA: Diagnosis not present

## 2018-04-08 DIAGNOSIS — J209 Acute bronchitis, unspecified: Secondary | ICD-10-CM | POA: Diagnosis not present

## 2018-04-08 DIAGNOSIS — Z79899 Other long term (current) drug therapy: Secondary | ICD-10-CM | POA: Insufficient documentation

## 2018-04-08 DIAGNOSIS — Z7982 Long term (current) use of aspirin: Secondary | ICD-10-CM | POA: Insufficient documentation

## 2018-04-08 DIAGNOSIS — R0789 Other chest pain: Secondary | ICD-10-CM | POA: Diagnosis not present

## 2018-04-08 DIAGNOSIS — Z7984 Long term (current) use of oral hypoglycemic drugs: Secondary | ICD-10-CM | POA: Diagnosis not present

## 2018-04-08 DIAGNOSIS — N189 Chronic kidney disease, unspecified: Secondary | ICD-10-CM | POA: Diagnosis not present

## 2018-04-08 DIAGNOSIS — I130 Hypertensive heart and chronic kidney disease with heart failure and stage 1 through stage 4 chronic kidney disease, or unspecified chronic kidney disease: Secondary | ICD-10-CM | POA: Insufficient documentation

## 2018-04-08 DIAGNOSIS — Z87891 Personal history of nicotine dependence: Secondary | ICD-10-CM | POA: Diagnosis not present

## 2018-04-08 DIAGNOSIS — E876 Hypokalemia: Secondary | ICD-10-CM

## 2018-04-08 DIAGNOSIS — R05 Cough: Secondary | ICD-10-CM | POA: Diagnosis not present

## 2018-04-08 DIAGNOSIS — R0602 Shortness of breath: Secondary | ICD-10-CM | POA: Diagnosis not present

## 2018-04-08 LAB — BASIC METABOLIC PANEL
Anion gap: 4 — ABNORMAL LOW (ref 5–15)
BUN: 22 mg/dL — ABNORMAL HIGH (ref 6–20)
CHLORIDE: 101 mmol/L (ref 101–111)
CO2: 31 mmol/L (ref 22–32)
Calcium: 8.8 mg/dL — ABNORMAL LOW (ref 8.9–10.3)
Creatinine, Ser: 0.91 mg/dL (ref 0.44–1.00)
GFR calc non Af Amer: 60 mL/min — ABNORMAL LOW (ref >60)
Glucose, Bld: 211 mg/dL — ABNORMAL HIGH (ref 65–99)
POTASSIUM: 4.1 mmol/L (ref 3.5–5.1)
Sodium: 136 mmol/L (ref 135–145)

## 2018-04-08 LAB — URINALYSIS, COMPLETE (UACMP) WITH MICROSCOPIC
BILIRUBIN URINE: NEGATIVE
GLUCOSE, UA: NEGATIVE mg/dL
HGB URINE DIPSTICK: NEGATIVE
Ketones, ur: NEGATIVE mg/dL
NITRITE: NEGATIVE
PROTEIN: NEGATIVE mg/dL
Specific Gravity, Urine: 1.009 (ref 1.005–1.030)
pH: 8 (ref 5.0–8.0)

## 2018-04-08 LAB — CBC
HEMATOCRIT: 36.6 % (ref 35.0–47.0)
HEMOGLOBIN: 11.9 g/dL — AB (ref 12.0–16.0)
MCH: 28.8 pg (ref 26.0–34.0)
MCHC: 32.5 g/dL (ref 32.0–36.0)
MCV: 88.7 fL (ref 80.0–100.0)
Platelets: 208 10*3/uL (ref 150–440)
RBC: 4.12 MIL/uL (ref 3.80–5.20)
RDW: 13.2 % (ref 11.5–14.5)
WBC: 8.8 10*3/uL (ref 3.6–11.0)

## 2018-04-08 LAB — BRAIN NATRIURETIC PEPTIDE: B Natriuretic Peptide: 255 pg/mL — ABNORMAL HIGH (ref 0.0–100.0)

## 2018-04-08 LAB — FIBRIN DERIVATIVES D-DIMER (ARMC ONLY): Fibrin derivatives D-dimer (ARMC): 493.26 ng/mL (FEU) (ref 0.00–499.00)

## 2018-04-08 LAB — TROPONIN I: Troponin I: 0.03 ng/mL (ref <0.03)

## 2018-04-08 MED ORDER — IPRATROPIUM-ALBUTEROL 0.5-2.5 (3) MG/3ML IN SOLN
3.0000 mL | Freq: Once | RESPIRATORY_TRACT | Status: AC
  Administered 2018-04-08: 3 mL via RESPIRATORY_TRACT
  Filled 2018-04-08: qty 3

## 2018-04-08 MED ORDER — PREDNISONE 20 MG PO TABS: 40.0000 mg | ORAL_TABLET | Freq: Every day | ORAL | 0 refills | Status: DC

## 2018-04-08 MED ORDER — ALBUTEROL SULFATE (2.5 MG/3ML) 0.083% IN NEBU: 2.5000 mg | INHALATION_SOLUTION | Freq: Four times a day (QID) | RESPIRATORY_TRACT | 1 refills | Status: DC | PRN

## 2018-04-08 MED ORDER — AZITHROMYCIN 500 MG PO TABS
500.0000 mg | ORAL_TABLET | Freq: Once | ORAL | Status: AC
  Administered 2018-04-08: 500 mg via ORAL
  Filled 2018-04-08: qty 1

## 2018-04-08 MED ORDER — AZITHROMYCIN 250 MG PO TABS: ORAL_TABLET | ORAL | 0 refills | Status: DC

## 2018-04-08 MED ORDER — PREDNISONE 20 MG PO TABS
40.0000 mg | ORAL_TABLET | Freq: Once | ORAL | Status: AC
  Administered 2018-04-08: 40 mg via ORAL
  Filled 2018-04-08: qty 2

## 2018-04-08 NOTE — ED Provider Notes (Signed)
[facility] Emergency Department Provider Note  ____________________________________________   First MD Initiated Contact with Patient 04/08/18 1208     (approximate)  I have reviewed the triage vital signs and the nursing notes.   HISTORY  Chief Complaint Shortness of Breath  HPI Megan Bolton is a 77 y.o. female with a previous history of COPD on 2 L oxygen and as well as history of heart failure and diabetes  Patient presents today reports about 1 week she has had a feeling of tightness across her chest, the last 2 days she has had occasional coughing with some small sputum and slight streaks of blood in it.  She reports she used her nebulizer twice the last 2 days and is helped relieve her symptoms somewhat.  Reports she continues to have a slight cough, feeling of slight tightness across the chest.  Reports she had similar in the past, treated for bronchitis.  Also reports a history of congestive heart failure, but reports those symptoms seem much different than her symptoms seem to be a lot worse when she laid down which is not the case today.  She is not seeing any increased swelling in her legs.  Denies nausea or vomiting.  Denies "chest pain" but does report it feels tight across her entire chest is been steady for about 1 week.  Reports last hospitalized in December.  No history of blood clots.  No leg swelling or calf pain.  Denies nausea or vomiting.  She has had some occasional achiness in her upper stomach, but reports that is gone away today.  Not sure what caused that either.  Reports it feels much better and is no longer causing any discomfort.  She also reports a history of frequent urinary tract infections, but denies having symptoms of a "UTI" today.  No pain or burning.  Past Medical History:  Diagnosis Date  . COPD (chronic obstructive pulmonary disease) ([place])   . Diabetes mellitus without complication ([place])   . Diastolic heart failure  ([place])   . Hyperlipidemia   . Hypertension     Patient Active Problem List   Diagnosis Date Noted  . CHF (congestive heart failure) ([place]) 12/19/2017  . Restless leg syndrome 09/07/2017  . CHF exacerbation ([place]) 08/30/2017  . Adjustment disorder with mixed anxiety and depressed mood 08/13/2017  . Peripheral neuropathy 07/14/2017  . Malignant hypertension 01/29/2017  . Chronic respiratory failure with hypoxia, on home O2 therapy ([place]) 12/24/2016  . Incomplete emptying of bladder 07/19/2016  . Bacteriuria 07/17/2016  . Infection due to ESBL-producing Escherichia coli 04/21/2016  . Frequent UTI 04/21/2016  . OSA on CPAP 04/18/2016  . Bladder infection, chronic 04/17/2016  . Calculus of kidney 04/17/2016  . Community acquired pneumonia 02/28/2016  . [name] (chronic kidney disease) 07/12/2015  . Controlled type 2 diabetes mellitus with diabetic neuropathy, without long-term current use of insulin ([place]) 06/11/2015  . Arthritis, degenerative 06/11/2015  . Hypercholesteremia 06/11/2015  . 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 Injury of kidney 04/14/2015  . Essential hypertension 04/14/2015  . Blood glucose elevated 04/14/2015  . Apnea, sleep 04/14/2015  . COPD (chronic obstructive pulmonary disease) ([place]) 04/14/2015  . DDD (degenerative disc disease), lumbar 08/01/2014  . Lumbar canal stenosis 05/11/2014  . Neuritis or radiculitis due to rupture of lumbar intervertebral disc 05/11/2014  . 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 Lumbosacral spondylosis 05/11/2014  . Thoracic and lumbosacral neuritis 05/11/2014  . Accelerating angina ([place]) 03/13/2014  Past Surgical History:  Procedure Laterality Date  . ABDOMINAL HYSTERECTOMY    . SPINE SURGERY      Prior to Admission medications   Medication Sig Start Date End Date Taking? Authorizing Provider  ACCU-CHEK AVIVA PLUS test strip 1 each by Other route 2 (two) times daily. 09/08/17  Yes Karamalegos,  Devonne Doughty, DO  ACCU-CHEK SOFTCLIX LANCETS lancets 1 each by Other route 2 (two) times daily. 09/08/17  Yes Karamalegos, Devonne Doughty, DO  aspirin EC 325 MG tablet Take 325 mg by mouth daily.   Yes [provider]  Blood Glucose Monitoring Suppl (ACCU-CHEK AVIVA PLUS) w/Device KIT USE AS DIRECTED 01/12/17  Yes Arlis Porta., MD  carvedilol (COREG) 25 MG tablet Take 1 tablet (25 mg total) by mouth 2 (two) times daily. 11/24/17  Yes Karamalegos, Devonne Doughty, DO  clonazePAM (KLONOPIN) 0.5 MG tablet Take 0.5 mg by mouth at bedtime.  07/04/15  Yes [provider]  doxycycline (VIBRAMYCIN) 100 MG capsule Take 100 mg by mouth daily.  08/26/17  Yes [provider]  Fluticasone-Salmeterol (ADVAIR DISKUS) 250-50 MCG/DOSE AEPB Inhale 1 puff into the lungs 2 (two) times daily. 05/04/17  Yes Karamalegos, Devonne Doughty, DO  furosemide (LASIX) 20 MG tablet Take 2 tablets (40 mg total) by mouth daily. 08/31/17  Yes Mody, Ulice Bold, MD  gabapentin (NEURONTIN) 800 MG tablet Take 1 tablet (800 mg total) by mouth 3 (three) times daily. 12/01/16  Yes Arlis Porta., MD  irbesartan-hydrochlorothiazide (AVALIDE) 300-12.5 MG tablet Take 1 tablet by mouth daily.  08/06/17  Yes [provider]  metFORMIN (GLUCOPHAGE) 500 MG tablet TAKE ONE TABLET BY MOUTH EVERY MORNING WITH BREAKFAST 03/24/17  Yes Arlis Porta., MD  montelukast (SINGULAIR) 10 MG tablet TAKE ONE TABLET BY MOUTH AT BEDTIME 03/24/17  Yes Arlis Porta., MD  OXYCONTIN 10 MG 12 hr tablet Take 10 mg by mouth 2 (two) times daily.   Yes [provider]  pantoprazole (PROTONIX) 40 MG tablet Take 40 mg by mouth daily. 12/08/17 12/08/18 Yes [provider]  potassium chloride (K-DUR) 10 MEQ tablet Take 1 tablet (10 mEq total) by mouth 2 (two) times daily. 10/12/17  Yes Karamalegos, Devonne Doughty, DO  sucralfate (CARAFATE) 1 g tablet Take 1 tablet by mouth 4 (four) times daily -  before meals and at bedtime.  02/15/18 02/15/19 Yes [provider]  tiotropium (SPIRIVA HANDIHALER) 18 MCG inhalation capsule Place 1 capsule (18 mcg total) into inhaler and inhale daily. 08/27/17  Yes Karamalegos, Devonne Doughty, DO  valACYclovir (VALTREX) 1000 MG tablet Take 1 tablet (1,000 mg total) by mouth daily as needed (for fever blisters). 04/19/16  Yes Gladstone Lighter, MD  albuterol (PROVENTIL) (2.5 MG/3ML) 0.083% nebulizer solution Take 3 mLs (2.5 mg total) by nebulization every 6 (six) hours as needed for wheezing or shortness of breath. 04/08/18   Delman Kitten, MD  albuterol (VENTOLIN HFA) 108 (90 Base) MCG/ACT inhaler Inhale 2 puffs into the lungs every 6 (six) hours as needed for wheezing or shortness of breath. 05/12/17   Karamalegos, Devonne Doughty, DO  azithromycin (ZITHROMAX Z-PAK) 250 MG tablet 1 tab by mouth daily for next 4 additional days (start 04/09/2018) 04/08/18   Delman Kitten, MD  cloNIDine (CATAPRES) 0.1 MG tablet Take 1 tablet (0.1 mg total) by mouth 2 (two) times daily. Take 1 tab up to 3 times a day for any BPs >170.  Hold dose if BP lower than 170. Patient not taking:  Reported on 04/08/2018 12/19/17   Hillary Bow, MD  OXYGEN Inhale 2 L into the lungs continuous.    [provider]  predniSONE (DELTASONE) 20 MG tablet Take 2 tablets (40 mg total) by mouth daily. 04/08/18   Delman Kitten, MD    Allergies Nitrofurantoin  Family History  Problem Relation Age of Onset  . Hypertension Mother   . Colon cancer Father   . Hypertension Paternal Grandfather     Social History Social History   Tobacco Use  . Smoking status: Former Smoker    Packs/day: 0.50    Last attempt to quit: 04/28/2015    Years since quitting: 2.9  . Smokeless tobacco: Never Used  Substance Use Topics  . Alcohol use: No    Alcohol/week: 0.0 oz  . Drug use: No    Review of Systems onstitutional: No fever/chills Eyes: No visual changes. ENT: No sore throat. Cardiovascular: Denies chest pain. Respiratory:  Denies shortness of breath. Gastrointestinal: No abdominal pain.  No nausea, no vomiting.  No diarrhea.  No constipation. Genitourinary: Negative for dysuria. Musculoskeletal: Negative for back pain. Skin: Negative for rash. Neurological: Negative for headaches, focal weakness or numbness.    ____________________________________________   PHYSICAL EXAM:  VITAL SIGNS: ED Triage Vitals  Enc Vitals Group     BP 04/08/18 1051 (!) 109/54     Pulse Rate 04/08/18 1051 62     Resp 04/08/18 1051 18     Temp 04/08/18 1051 98.9 F (37.2 C)     Temp Source 04/08/18 1051 Oral     SpO2 04/08/18 1051 91 %     Weight 04/08/18 1051 212 lb (96.2 kg)     Height 04/08/18 1051 5' 6"  (1.676 m)     Head Circumference --      Peak Flow --      Pain Score 04/08/18 1049 0     Pain Loc --      Pain Edu? --      Excl. in [place]? --     Constitutional: Alert and oriented. Well appearing and in no acute distress.  Her daughter both very pleasant.  Wearing 2 L nasal cannula.  Saturating 94% on room air.  Speaks in full and clear sentences. Eyes: Conjunctivae are normal. Head: Atraumatic. Nose: No congestion/rhinnorhea. Mouth/Throat: Mucous membranes are moist. Neck: No stridor.  No JVD. Cardiovascular: Normal rate, regular rhythm. Grossly normal heart sounds.  Good peripheral circulation. Respiratory: Normal respiratory effort.  No retractions. Lungs CTAB.  She has an occasional dry cough in the room, but no wheezing.  She shows no evidence of increased work of breathing. Gastrointestinal: Soft and nontender. No distention. Musculoskeletal: No lower extremity tenderness nor edema.  Lower extremities move well without difficulty. Neurologic:  Normal speech and language. No gross focal neurologic deficits are appreciated.  Skin:  Skin is warm, dry and intact. No rash noted. Psychiatric: Mood and affect are normal. Speech and behavior are normal.  ____________________________________________   LABS (all  labs ordered are listed, but only abnormal results are displayed)  Labs Reviewed  BASIC METABOLIC PANEL - Abnormal; Notable for the following components:      Result Value   Glucose, Bld 211 (*)    BUN 22 (*)    Calcium 8.8 (*)    GFR calc non Af Amer 60 (*)    Anion gap 4 (*)    All other components within normal limits  CBC - Abnormal; Notable for the following components:  Hemoglobin 11.9 (*)    All other components within normal limits  TROPONIN I - Abnormal; Notable for the following components:   Troponin I 0.03 (*)    All other components within normal limits  URINALYSIS, COMPLETE (UACMP) WITH MICROSCOPIC - Abnormal; Notable for the following components:   Color, Urine YELLOW (*)    APPearance HAZY (*)    Leukocytes, UA MODERATE (*)    Bacteria, UA RARE (*)    Squamous Epithelial / LPF 0-5 (*)    All other components within normal limits  BRAIN NATRIURETIC PEPTIDE - Abnormal; Notable for the following components:   B Natriuretic Peptide 255.0 (*)    All other components within normal limits  URINE CULTURE  FIBRIN DERIVATIVES D-DIMER ([facility] ONLY)   ____________________________________________  EKG  ED ECG REPORT I, Delman Kitten, the attending physician, personally viewed and interpreted this ECG.  Date: 04/08/2018 EKG Time: 1050 Rate: 60 Rhythm: normal sinus rhythm QRS Axis: normal Intervals: normal ST/T Wave abnormalities: normal Narrative Interpretation: no evidence of acute ischemia  Patient denying any "chest pain" on today's visit.  Reports slight tightness across the chest for 1 week.  She does have a minimally elevated troponin, but has a history of what appears to be chronic troponin elevation.  Symptoms today do not seem to suggest ACS.  Will be better explained by pulmonary disease likely bronchitis exacerbation. ____________________________________________  RADIOLOGY  Chest x-ray reviewed, chronic findings, but no evidence of acute infiltrate.  No  evidence of edema or CHF    ____________________________________________   PROCEDURES  Procedure(s) performed: None  Procedures  Critical Care performed: No  ____________________________________________   INITIAL IMPRESSION / ASSESSMENT AND PLAN / ED COURSE  Pertinent labs & imaging results that were available during my care of the patient were reviewed by me and considered in my medical decision making (see chart for details).  Patient returns for evaluation of chest tightness for about 1 week now having a cough with slight phlegm and minimal streakiness of hemoptysis for 2 days.  No evidence of increased work of breathing on exam, no acute hypoxia, lung sounds are clear without wheezing at present.  Her symptoms seem to suggest mild exacerbation of chronic bronchitis, possibly due to some seasonality of allergies or mild infectious etiology considered.  No signs or symptoms of acute coronary disease.  Minimally elevated troponins peers to be at her baseline.  Lab work reassuring.  She is low risk by pretest probability and Wells criteria and I will send a d-dimer to exclude pulmonary embolism, also check BNP.  Clinical Course as of Apr 09 1431  Fri Apr 08, 2018  1208 Troponin I(!!): 0.03 [MQ]    Clinical Course User Index [MQ] Delman Kitten, MD   ----------------------------------------- 2:32 PM on 04/08/2018 -----------------------------------------  Patient reports feeling improvement.  Resting comfortably with no evidence of increased work of breathing.  Currently satting normally with clear lungs.  She reports to feel better would like to be able to go home.  She and her daughter are comfortable plan for discharge, her d-dimer is exclusionary for PE, BNP slightly elevated though improved from previous and no evidence of acute CHF exacerbation noted.  Discussed with the patient, treatment precautions, follow-up care patient and her daughter both agreeable.  Return precautions  and treatment recommendations and follow-up discussed with the patient who is agreeable with the plan.   ____________________________________________   FINAL CLINICAL IMPRESSION(S) / ED DIAGNOSES  Final diagnoses:  COPD exacerbation ([place])  Acute  bronchitis, unspecified organism      NEW MEDICATIONS STARTED DURING THIS VISIT:  New Prescriptions   ALBUTEROL (PROVENTIL) (2.5 MG/3ML) 0.083% NEBULIZER SOLUTION    Take 3 mLs (2.5 mg total) by nebulization every 6 (six) hours as needed for wheezing or shortness of breath.   AZITHROMYCIN (ZITHROMAX Z-PAK) 250 MG TABLET    1 tab by mouth daily for next 4 additional days (start 04/09/2018)   PREDNISONE (DELTASONE) 20 MG TABLET    Take 2 tablets (40 mg total) by mouth daily.     Note:  This document was prepared using Dragon voice recognition software and may include unintentional dictation errors.     Delman Kitten, MD 04/08/18 1432

## 2018-04-08 NOTE — ED Notes (Signed)
Patient also reports urinary frequency and states, "I'm prone to UTIs, I don't know if that's got anything to do with this, but I want to find out."

## 2018-04-08 NOTE — ED Triage Notes (Signed)
Patient presents to the ED with increased shortness of breath and, "not feeling good" for the past 3 days.  Patient reports nausea and lack of appetite.  Denies vomiting and fever.  Patient denies chest pain but reports tightness with coughing and breathing.  Patient reports yellow phlegm.

## 2018-04-08 NOTE — Discharge Instructions (Signed)
We believe that your symptoms are caused today by an exacerbation of your COPD, and possibly bronchitis.  Please take the prescribed medications and any medications that you have at home for your COPD.  Follow up with your doctor as recommended.  If you develop any new or worsening symptoms, including but not limited to fever, persistent vomiting, worsening shortness of breath, or other symptoms that concern you, please return to the Emergency Department immediately. ° °

## 2018-04-09 DIAGNOSIS — J438 Other emphysema: Secondary | ICD-10-CM | POA: Diagnosis not present

## 2018-04-09 DIAGNOSIS — J449 Chronic obstructive pulmonary disease, unspecified: Secondary | ICD-10-CM | POA: Diagnosis not present

## 2018-04-11 DIAGNOSIS — R634 Abnormal weight loss: Secondary | ICD-10-CM | POA: Diagnosis not present

## 2018-04-11 DIAGNOSIS — K219 Gastro-esophageal reflux disease without esophagitis: Secondary | ICD-10-CM | POA: Diagnosis not present

## 2018-04-11 DIAGNOSIS — R1013 Epigastric pain: Secondary | ICD-10-CM | POA: Diagnosis not present

## 2018-04-11 LAB — URINE CULTURE
Culture: >=100000 — AB
Special Requests: NORMAL

## 2018-04-12 ENCOUNTER — Other Ambulatory Visit: Payer: Self-pay | Admitting: Gastroenterology

## 2018-04-12 DIAGNOSIS — R1013 Epigastric pain: Secondary | ICD-10-CM

## 2018-04-12 DIAGNOSIS — R634 Abnormal weight loss: Secondary | ICD-10-CM

## 2018-04-12 DIAGNOSIS — K219 Gastro-esophageal reflux disease without esophagitis: Secondary | ICD-10-CM

## 2018-04-12 NOTE — Progress Notes (Signed)
ED Culture report with >100 K E. Coli and >100 K VRE. Patient was here on the 12th with shortness of breath, diagnosed with bronchitis/AECOPD, and discharged on azithromycin. Spoke with Dr. Clearnce Hasten in ED - okay to add fosfomycin 3 gm po x 1 for UTI. I spoke with Mrs. Kesecker who is familiar with UTI and agrees to add this agent. She will also finish her azithromycin, but tells me she no longer wants to take the prednisone she was received at discharge. She tells me she takes doxycycline daily for recurrent UTI and I encouraged her to follow up with her urologist. We reviewed directions for use and common side effects. She uses [facility]. I spoke with Autumn at [facility] who says she isn't sure if they carry fosfomycin but will work on getting it in. She confirmed the RX by read back.  Delynda Sepulveda A. [place], [place].D., BCPS Clinical Pharmacist 13:06 04/12/18

## 2018-04-19 ENCOUNTER — Ambulatory Visit
Admission: RE | Admit: 2018-04-19 | Discharge: 2018-04-19 | Disposition: A | Discharge status: Home / Self Care | Payer: Medicare HMO | Source: Ambulatory Visit | Attending: Gastroenterology | Admitting: Gastroenterology

## 2018-04-19 DIAGNOSIS — K219 Gastro-esophageal reflux disease without esophagitis: Secondary | ICD-10-CM | POA: Diagnosis not present

## 2018-04-19 DIAGNOSIS — N135 Crossing vessel and stricture of ureter without hydronephrosis: Secondary | ICD-10-CM | POA: Diagnosis not present

## 2018-04-19 DIAGNOSIS — I700 Atherosclerosis of aorta: Secondary | ICD-10-CM | POA: Diagnosis not present

## 2018-04-19 DIAGNOSIS — I708 Atherosclerosis of other arteries: Secondary | ICD-10-CM | POA: Diagnosis not present

## 2018-04-19 DIAGNOSIS — R1013 Epigastric pain: Secondary | ICD-10-CM | POA: Diagnosis not present

## 2018-04-19 DIAGNOSIS — N281 Cyst of kidney, acquired: Secondary | ICD-10-CM | POA: Insufficient documentation

## 2018-04-19 DIAGNOSIS — R918 Other nonspecific abnormal finding of lung field: Secondary | ICD-10-CM | POA: Diagnosis not present

## 2018-04-19 DIAGNOSIS — R634 Abnormal weight loss: Secondary | ICD-10-CM | POA: Diagnosis not present

## 2018-04-19 HISTORY — DX: Heart failure, unspecified: I50.9

## 2018-04-19 MED ORDER — IOPAMIDOL (ISOVUE-300) INJECTION 61%
100.0000 mL | Freq: Once | INTRAVENOUS | Status: AC | PRN
  Administered 2018-04-19: 100 mL via INTRAVENOUS

## 2018-04-20 DIAGNOSIS — R399 Unspecified symptoms and signs involving the genitourinary system: Secondary | ICD-10-CM | POA: Diagnosis not present

## 2018-04-20 DIAGNOSIS — J449 Chronic obstructive pulmonary disease, unspecified: Secondary | ICD-10-CM | POA: Diagnosis not present

## 2018-04-20 DIAGNOSIS — N3000 Acute cystitis without hematuria: Secondary | ICD-10-CM | POA: Diagnosis not present

## 2018-04-25 DIAGNOSIS — F1120 Opioid dependence, uncomplicated: Secondary | ICD-10-CM | POA: Diagnosis not present

## 2018-04-25 DIAGNOSIS — M5416 Radiculopathy, lumbar region: Secondary | ICD-10-CM | POA: Diagnosis not present

## 2018-04-25 DIAGNOSIS — F17290 Nicotine dependence, other tobacco product, uncomplicated: Secondary | ICD-10-CM | POA: Diagnosis not present

## 2018-04-25 DIAGNOSIS — R350 Frequency of micturition: Secondary | ICD-10-CM | POA: Diagnosis not present

## 2018-04-25 DIAGNOSIS — M5430 Sciatica, unspecified side: Secondary | ICD-10-CM | POA: Diagnosis not present

## 2018-04-25 DIAGNOSIS — Z79899 Other long term (current) drug therapy: Secondary | ICD-10-CM | POA: Diagnosis not present

## 2018-04-25 DIAGNOSIS — J439 Emphysema, unspecified: Secondary | ICD-10-CM | POA: Diagnosis not present

## 2018-04-25 DIAGNOSIS — Z7151 Drug abuse counseling and surveillance of drug abuser: Secondary | ICD-10-CM | POA: Diagnosis not present

## 2018-04-25 DIAGNOSIS — Z729 Problem related to lifestyle, unspecified: Secondary | ICD-10-CM | POA: Diagnosis not present

## 2018-04-25 DIAGNOSIS — R3915 Urgency of urination: Secondary | ICD-10-CM | POA: Diagnosis not present

## 2018-04-25 DIAGNOSIS — G894 Chronic pain syndrome: Secondary | ICD-10-CM | POA: Diagnosis not present

## 2018-04-25 DIAGNOSIS — Z79891 Long term (current) use of opiate analgesic: Secondary | ICD-10-CM | POA: Diagnosis not present

## 2018-04-25 DIAGNOSIS — R5383 Other fatigue: Secondary | ICD-10-CM | POA: Diagnosis not present

## 2018-04-28 DIAGNOSIS — R0609 Other forms of dyspnea: Secondary | ICD-10-CM | POA: Diagnosis not present

## 2018-04-28 DIAGNOSIS — I272 Pulmonary hypertension, unspecified: Secondary | ICD-10-CM | POA: Diagnosis not present

## 2018-04-28 DIAGNOSIS — R911 Solitary pulmonary nodule: Secondary | ICD-10-CM | POA: Diagnosis not present

## 2018-04-28 DIAGNOSIS — G4733 Obstructive sleep apnea (adult) (pediatric): Secondary | ICD-10-CM | POA: Diagnosis not present

## 2018-04-28 DIAGNOSIS — J432 Centrilobular emphysema: Secondary | ICD-10-CM | POA: Diagnosis not present

## 2018-04-29 ENCOUNTER — Other Ambulatory Visit: Payer: Self-pay | Admitting: Specialist

## 2018-04-29 DIAGNOSIS — R0609 Other forms of dyspnea: Principal | ICD-10-CM

## 2018-05-09 DIAGNOSIS — J449 Chronic obstructive pulmonary disease, unspecified: Secondary | ICD-10-CM | POA: Diagnosis not present

## 2018-05-09 DIAGNOSIS — J438 Other emphysema: Secondary | ICD-10-CM | POA: Diagnosis not present

## 2018-05-17 ENCOUNTER — Other Ambulatory Visit: Payer: Self-pay | Admitting: Family Medicine

## 2018-05-17 DIAGNOSIS — J432 Centrilobular emphysema: Secondary | ICD-10-CM

## 2018-05-20 DIAGNOSIS — J449 Chronic obstructive pulmonary disease, unspecified: Secondary | ICD-10-CM | POA: Diagnosis not present

## 2018-05-24 ENCOUNTER — Encounter: Payer: Self-pay | Admitting: Emergency Medicine

## 2018-05-24 ENCOUNTER — Ambulatory Visit
Admission: RE | Admit: 2018-05-24 | Discharge: 2018-05-24 | Disposition: A | Discharge status: Home / Self Care | Payer: Medicare HMO | Source: Ambulatory Visit | Attending: Specialist | Admitting: Specialist

## 2018-05-24 ENCOUNTER — Other Ambulatory Visit: Payer: Self-pay

## 2018-05-24 DIAGNOSIS — N281 Cyst of kidney, acquired: Secondary | ICD-10-CM | POA: Insufficient documentation

## 2018-05-24 DIAGNOSIS — D649 Anemia, unspecified: Secondary | ICD-10-CM | POA: Diagnosis not present

## 2018-05-24 DIAGNOSIS — J9621 Acute and chronic respiratory failure with hypoxia: Principal | ICD-10-CM | POA: Insufficient documentation

## 2018-05-24 DIAGNOSIS — F329 Major depressive disorder, single episode, unspecified: Secondary | ICD-10-CM | POA: Diagnosis not present

## 2018-05-24 DIAGNOSIS — J441 Chronic obstructive pulmonary disease with (acute) exacerbation: Secondary | ICD-10-CM | POA: Diagnosis not present

## 2018-05-24 DIAGNOSIS — Z881 Allergy status to other antibiotic agents status: Secondary | ICD-10-CM | POA: Diagnosis not present

## 2018-05-24 DIAGNOSIS — F419 Anxiety disorder, unspecified: Secondary | ICD-10-CM | POA: Diagnosis not present

## 2018-05-24 DIAGNOSIS — I700 Atherosclerosis of aorta: Secondary | ICD-10-CM | POA: Insufficient documentation

## 2018-05-24 DIAGNOSIS — J810 Acute pulmonary edema: Secondary | ICD-10-CM | POA: Diagnosis not present

## 2018-05-24 DIAGNOSIS — Z87891 Personal history of nicotine dependence: Secondary | ICD-10-CM | POA: Diagnosis not present

## 2018-05-24 DIAGNOSIS — I110 Hypertensive heart disease with heart failure: Secondary | ICD-10-CM | POA: Diagnosis not present

## 2018-05-24 DIAGNOSIS — Z9981 Dependence on supplemental oxygen: Secondary | ICD-10-CM | POA: Insufficient documentation

## 2018-05-24 DIAGNOSIS — Z7982 Long term (current) use of aspirin: Secondary | ICD-10-CM | POA: Insufficient documentation

## 2018-05-24 DIAGNOSIS — I5032 Chronic diastolic (congestive) heart failure: Secondary | ICD-10-CM | POA: Diagnosis not present

## 2018-05-24 DIAGNOSIS — Z79899 Other long term (current) drug therapy: Secondary | ICD-10-CM | POA: Insufficient documentation

## 2018-05-24 DIAGNOSIS — R0602 Shortness of breath: Secondary | ICD-10-CM | POA: Diagnosis not present

## 2018-05-24 DIAGNOSIS — Z7984 Long term (current) use of oral hypoglycemic drugs: Secondary | ICD-10-CM | POA: Insufficient documentation

## 2018-05-24 DIAGNOSIS — E1165 Type 2 diabetes mellitus with hyperglycemia: Secondary | ICD-10-CM | POA: Diagnosis not present

## 2018-05-24 DIAGNOSIS — E785 Hyperlipidemia, unspecified: Secondary | ICD-10-CM | POA: Diagnosis not present

## 2018-05-24 DIAGNOSIS — I313 Pericardial effusion (noninflammatory): Secondary | ICD-10-CM | POA: Insufficient documentation

## 2018-05-24 DIAGNOSIS — J90 Pleural effusion, not elsewhere classified: Secondary | ICD-10-CM | POA: Insufficient documentation

## 2018-05-24 DIAGNOSIS — R0609 Other forms of dyspnea: Principal | ICD-10-CM

## 2018-05-24 LAB — BASIC METABOLIC PANEL
Anion gap: 7 (ref 5–15)
BUN: 27 mg/dL — AB (ref 6–20)
CHLORIDE: 102 mmol/L (ref 101–111)
CO2: 29 mmol/L (ref 22–32)
Calcium: 8.8 mg/dL — ABNORMAL LOW (ref 8.9–10.3)
Creatinine, Ser: 0.88 mg/dL (ref 0.44–1.00)
GFR calc Af Amer: >60 mL/min (ref >60)
GFR calc non Af Amer: >60 mL/min (ref >60)
Glucose, Bld: 200 mg/dL — ABNORMAL HIGH (ref 65–99)
POTASSIUM: 4.1 mmol/L (ref 3.5–5.1)
Sodium: 138 mmol/L (ref 135–145)

## 2018-05-24 LAB — CBC
HEMATOCRIT: 36.5 % (ref 35.0–47.0)
HEMOGLOBIN: 11.7 g/dL — AB (ref 12.0–16.0)
MCH: 28.1 pg (ref 26.0–34.0)
MCHC: 32.1 g/dL (ref 32.0–36.0)
MCV: 87.6 fL (ref 80.0–100.0)
Platelets: 192 10*3/uL (ref 150–440)
RBC: 4.16 MIL/uL (ref 3.80–5.20)
RDW: 13.6 % (ref 11.5–14.5)
WBC: 11.7 10*3/uL — AB (ref 3.6–11.0)

## 2018-05-24 LAB — TROPONIN I: Troponin I: 0.03 ng/mL (ref <0.03)

## 2018-05-24 MED ORDER — IOPAMIDOL (ISOVUE-300) INJECTION 61%
100.0000 mL | Freq: Once | INTRAVENOUS | Status: AC | PRN
  Administered 2018-05-24: 75 mL via INTRAVENOUS

## 2018-05-24 NOTE — ED Triage Notes (Signed)
Pt reports she has had increased SOB since 2030 tonight. Pt is on 2L of O2 and is 86% in triage. Pt seen in [place] today, had CT and chest x-ray due to nodules found in lungs. Pt has COPD.

## 2018-05-25 ENCOUNTER — Encounter: Payer: Self-pay | Admitting: Internal Medicine

## 2018-05-25 ENCOUNTER — Observation Stay
Admit: 2018-05-25 | Discharge: 2018-05-25 | Disposition: A | Discharge status: Home / Self Care | Payer: Medicare HMO | Attending: Internal Medicine | Admitting: Internal Medicine

## 2018-05-25 ENCOUNTER — Observation Stay
Admission: EM | Admit: 2018-05-25 | Discharge: 2018-05-25 | Disposition: A | Discharge status: Home / Self Care | Payer: Medicare HMO | Attending: Internal Medicine | Admitting: Internal Medicine

## 2018-05-25 ENCOUNTER — Other Ambulatory Visit: Payer: Self-pay

## 2018-05-25 DIAGNOSIS — R0602 Shortness of breath: Secondary | ICD-10-CM | POA: Diagnosis not present

## 2018-05-25 DIAGNOSIS — J810 Acute pulmonary edema: Secondary | ICD-10-CM

## 2018-05-25 DIAGNOSIS — J9601 Acute respiratory failure with hypoxia: Secondary | ICD-10-CM | POA: Diagnosis present

## 2018-05-25 DIAGNOSIS — I272 Pulmonary hypertension, unspecified: Secondary | ICD-10-CM | POA: Diagnosis not present

## 2018-05-25 DIAGNOSIS — J9621 Acute and chronic respiratory failure with hypoxia: Secondary | ICD-10-CM | POA: Diagnosis not present

## 2018-05-25 DIAGNOSIS — J441 Chronic obstructive pulmonary disease with (acute) exacerbation: Secondary | ICD-10-CM | POA: Diagnosis not present

## 2018-05-25 DIAGNOSIS — E1165 Type 2 diabetes mellitus with hyperglycemia: Secondary | ICD-10-CM | POA: Diagnosis not present

## 2018-05-25 LAB — TROPONIN I
TROPONIN I: 0.04 ng/mL — AB (ref <0.03)
Troponin I: 0.04 ng/mL (ref <0.03)

## 2018-05-25 LAB — BASIC METABOLIC PANEL
ANION GAP: 9 (ref 5–15)
BUN: 24 mg/dL — ABNORMAL HIGH (ref 6–20)
CALCIUM: 8.7 mg/dL — AB (ref 8.9–10.3)
CO2: 28 mmol/L (ref 22–32)
Chloride: 100 mmol/L — ABNORMAL LOW (ref 101–111)
Creatinine, Ser: 0.92 mg/dL (ref 0.44–1.00)
GFR, EST NON AFRICAN AMERICAN: 59 mL/min — AB (ref >60)
Glucose, Bld: 214 mg/dL — ABNORMAL HIGH (ref 65–99)
Potassium: 4.1 mmol/L (ref 3.5–5.1)
SODIUM: 137 mmol/L (ref 135–145)

## 2018-05-25 LAB — BRAIN NATRIURETIC PEPTIDE: B NATRIURETIC PEPTIDE 5: 370 pg/mL — AB (ref 0.0–100.0)

## 2018-05-25 LAB — GLUCOSE, CAPILLARY
GLUCOSE-CAPILLARY: 193 mg/dL — AB (ref 65–99)
Glucose-Capillary: 291 mg/dL — ABNORMAL HIGH (ref 65–99)

## 2018-05-25 MED ORDER — ACETAMINOPHEN 325 MG PO TABS
650.0000 mg | ORAL_TABLET | Freq: Four times a day (QID) | ORAL | Status: DC | PRN
  Administered 2018-05-25: 650 mg via ORAL
  Filled 2018-05-25: qty 2

## 2018-05-25 MED ORDER — IRBESARTAN-HYDROCHLOROTHIAZIDE 300-12.5 MG PO TABS: 1.0000 | ORAL_TABLET | Freq: Every day | ORAL | Status: DC

## 2018-05-25 MED ORDER — IPRATROPIUM-ALBUTEROL 0.5-2.5 (3) MG/3ML IN SOLN
3.0000 mL | Freq: Once | RESPIRATORY_TRACT | Status: AC
  Administered 2018-05-25: 3 mL via RESPIRATORY_TRACT

## 2018-05-25 MED ORDER — CLONAZEPAM 0.5 MG PO TABS: 0.5000 mg | ORAL_TABLET | Freq: Every day | ORAL | Status: DC

## 2018-05-25 MED ORDER — ONDANSETRON HCL 4 MG PO TABS: 4.0000 mg | ORAL_TABLET | Freq: Four times a day (QID) | ORAL | Status: DC | PRN

## 2018-05-25 MED ORDER — IPRATROPIUM-ALBUTEROL 0.5-2.5 (3) MG/3ML IN SOLN
3.0000 mL | Freq: Once | RESPIRATORY_TRACT | Status: AC
Start: 2018-05-25 — End: 2018-05-25
  Administered 2018-05-25: 3 mL via RESPIRATORY_TRACT
  Filled 2018-05-25: qty 3

## 2018-05-25 MED ORDER — INSULIN ASPART 100 UNIT/ML ~~LOC~~ SOLN
0.0000 [IU] | Freq: Three times a day (TID) | SUBCUTANEOUS | Status: DC
  Administered 2018-05-25: 8 [IU] via SUBCUTANEOUS
  Administered 2018-05-25: 3 [IU] via SUBCUTANEOUS
  Filled 2018-05-25 (×2): qty 1

## 2018-05-25 MED ORDER — ENOXAPARIN SODIUM 40 MG/0.4ML ~~LOC~~ SOLN: 40.0000 mg | SUBCUTANEOUS | Status: DC

## 2018-05-25 MED ORDER — IPRATROPIUM-ALBUTEROL 0.5-2.5 (3) MG/3ML IN SOLN
3.0000 mL | Freq: Four times a day (QID) | RESPIRATORY_TRACT | Status: DC
  Administered 2018-05-25 (×2): 3 mL via RESPIRATORY_TRACT
  Filled 2018-05-25 (×2): qty 3

## 2018-05-25 MED ORDER — OXYCODONE HCL ER 10 MG PO T12A
20.0000 mg | EXTENDED_RELEASE_TABLET | Freq: Two times a day (BID) | ORAL | Status: DC
  Administered 2018-05-25: 09:00:00 20 mg via ORAL
  Filled 2018-05-25: qty 2

## 2018-05-25 MED ORDER — CARVEDILOL 25 MG PO TABS
25.0000 mg | ORAL_TABLET | Freq: Two times a day (BID) | ORAL | Status: DC
  Administered 2018-05-25: 09:00:00 25 mg via ORAL
  Filled 2018-05-25: qty 1

## 2018-05-25 MED ORDER — ACETAMINOPHEN 650 MG RE SUPP: 650.0000 mg | Freq: Four times a day (QID) | RECTAL | Status: DC | PRN

## 2018-05-25 MED ORDER — CLONIDINE HCL 0.1 MG PO TABS
0.1000 mg | ORAL_TABLET | Freq: Two times a day (BID) | ORAL | Status: DC
  Filled 2018-05-25: qty 1

## 2018-05-25 MED ORDER — GABAPENTIN 400 MG PO CAPS
800.0000 mg | ORAL_CAPSULE | Freq: Three times a day (TID) | ORAL | Status: DC
  Administered 2018-05-25: 800 mg via ORAL
  Filled 2018-05-25: qty 8
  Filled 2018-05-25 (×3): qty 2

## 2018-05-25 MED ORDER — IPRATROPIUM-ALBUTEROL 0.5-2.5 (3) MG/3ML IN SOLN: 3.0000 mL | RESPIRATORY_TRACT | Status: DC | PRN

## 2018-05-25 MED ORDER — OXYCODONE HCL ER 10 MG PO T12A: 10.0000 mg | EXTENDED_RELEASE_TABLET | Freq: Two times a day (BID) | ORAL | Status: DC

## 2018-05-25 MED ORDER — ASPIRIN EC 325 MG PO TBEC
325.0000 mg | DELAYED_RELEASE_TABLET | Freq: Every day | ORAL | Status: DC
  Administered 2018-05-25: 09:00:00 325 mg via ORAL
  Filled 2018-05-25: qty 1

## 2018-05-25 MED ORDER — MOMETASONE FURO-FORMOTEROL FUM 200-5 MCG/ACT IN AERO
2.0000 | INHALATION_SPRAY | Freq: Two times a day (BID) | RESPIRATORY_TRACT | Status: DC
  Administered 2018-05-25: 09:00:00 2 via RESPIRATORY_TRACT
  Filled 2018-05-25: qty 8.8

## 2018-05-25 MED ORDER — IRBESARTAN 150 MG PO TABS
300.0000 mg | ORAL_TABLET | Freq: Every day | ORAL | Status: DC
  Administered 2018-05-25: 09:00:00 300 mg via ORAL
  Filled 2018-05-25: qty 2

## 2018-05-25 MED ORDER — PREDNISONE 20 MG PO TABS: 40.0000 mg | ORAL_TABLET | Freq: Every day | ORAL | Status: DC

## 2018-05-25 MED ORDER — METHYLPREDNISOLONE SODIUM SUCC 125 MG IJ SOLR
125.0000 mg | Freq: Once | INTRAMUSCULAR | Status: AC
  Administered 2018-05-25: 125 mg via INTRAVENOUS
  Filled 2018-05-25: qty 2

## 2018-05-25 MED ORDER — MONTELUKAST SODIUM 10 MG PO TABS: 10.0000 mg | ORAL_TABLET | Freq: Every day | ORAL | Status: DC

## 2018-05-25 MED ORDER — PREDNISONE 10 MG (21) PO TBPK: ORAL_TABLET | ORAL | 0 refills | Status: DC

## 2018-05-25 MED ORDER — SENNOSIDES-DOCUSATE SODIUM 8.6-50 MG PO TABS: 1.0000 | ORAL_TABLET | Freq: Every evening | ORAL | Status: DC | PRN

## 2018-05-25 MED ORDER — ORAL CARE MOUTH RINSE
15.0000 mL | Freq: Two times a day (BID) | OROMUCOSAL | Status: DC
  Administered 2018-05-25: 15 mL via OROMUCOSAL

## 2018-05-25 MED ORDER — FUROSEMIDE 10 MG/ML IJ SOLN
40.0000 mg | Freq: Once | INTRAMUSCULAR | Status: AC
  Administered 2018-05-25: 40 mg via INTRAVENOUS
  Filled 2018-05-25: qty 4

## 2018-05-25 MED ORDER — HYDROCHLOROTHIAZIDE 12.5 MG PO CAPS
12.5000 mg | ORAL_CAPSULE | Freq: Every day | ORAL | Status: DC
  Filled 2018-05-25: qty 1

## 2018-05-25 MED ORDER — AZITHROMYCIN 500 MG PO TABS: 500.0000 mg | ORAL_TABLET | Freq: Every day | ORAL | 0 refills | Status: AC

## 2018-05-25 MED ORDER — OXYCODONE ER 9 MG PO C12A: 1.0000 | EXTENDED_RELEASE_CAPSULE | Freq: Two times a day (BID) | ORAL | Status: DC

## 2018-05-25 MED ORDER — PANTOPRAZOLE SODIUM 40 MG PO TBEC
40.0000 mg | DELAYED_RELEASE_TABLET | Freq: Every day | ORAL | Status: DC
  Administered 2018-05-25: 40 mg via ORAL
  Filled 2018-05-25: qty 1

## 2018-05-25 MED ORDER — SUCRALFATE 1 G PO TABS
1.0000 g | ORAL_TABLET | Freq: Three times a day (TID) | ORAL | Status: DC
  Administered 2018-05-25 (×2): 1 g via ORAL
  Filled 2018-05-25 (×2): qty 1

## 2018-05-25 MED ORDER — ONDANSETRON HCL 4 MG/2ML IJ SOLN: 4.0000 mg | Freq: Four times a day (QID) | INTRAMUSCULAR | Status: DC | PRN

## 2018-05-25 MED ORDER — FUROSEMIDE 40 MG PO TABS
40.0000 mg | ORAL_TABLET | Freq: Every day | ORAL | Status: DC
  Administered 2018-05-25: 10:00:00 40 mg via ORAL
  Filled 2018-05-25: qty 1

## 2018-05-25 MED ORDER — BISACODYL 5 MG PO TBEC: 5.0000 mg | DELAYED_RELEASE_TABLET | Freq: Every day | ORAL | Status: DC | PRN

## 2018-05-25 NOTE — Plan of Care (Signed)
Pt is d/ced home.  She came in with acute resp failure.  She is on chronic O2 at home - 2L; she's using 2.5 here.  She ambulated to BR w/out O2 and didn't experience any distress.  Pt lives with her daughter.  She will be on a prednisone taper and on azithromycin for a couple of days.  IV removed d/c instructions reviewed.  Daughter will transport her home.

## 2018-05-25 NOTE — Progress Notes (Signed)
Text paged hospitalist about lab value: troponin 0.04. Message accepted

## 2018-05-25 NOTE — ED Provider Notes (Signed)
[facility] Emergency Department Provider Note   First MD Initiated Contact with Patient 05/25/18 0421     (approximate)  I have reviewed the triage vital signs and the nursing notes.   HISTORY  Chief Complaint Chills and Shortness of Breath   HPI Megan Bolton is a 77 y.o. female with below list of chronic medical conditions including COPD and CHF presents to the emergency department with progressive dyspnea since 10:30 PM tonight.  Patient is on a baseline of 2 L of oxygen via nasal cannula with normal saturation of 95% however on presentation patient's oxygen saturation 86 on baseline 2 L.  Patient denies any chest pain.  Patient denies any lower extremity pain or swelling.  Patient states that symptoms consistent with when she had "fluid in her lungs before.   Past Medical History:  Diagnosis Date  . CHF (congestive heart failure) ([place])   . COPD (chronic obstructive pulmonary disease) ([place])   . Diabetes mellitus without complication ([place])   . Diastolic heart failure ([place])   . Hyperlipidemia   . Hypertension     Patient Active Problem List   Diagnosis Date Noted  . Acute hypoxemic respiratory failure ([place]) 05/25/2018  . CHF (congestive heart failure) ([place]) 12/19/2017  . Restless leg syndrome 09/07/2017  . CHF exacerbation ([place]) 08/30/2017  . Adjustment disorder with mixed anxiety and depressed mood 08/13/2017  . Peripheral neuropathy 07/14/2017  . Malignant hypertension 01/29/2017  . Chronic respiratory failure with hypoxia, on home O2 therapy ([place]) 12/24/2016  . Incomplete emptying of bladder 07/19/2016  . Bacteriuria 07/17/2016  . Infection due to ESBL-producing Escherichia coli 04/21/2016  . Frequent UTI 04/21/2016  . OSA on CPAP 04/18/2016  . Bladder infection, chronic 04/17/2016  . Calculus of kidney 04/17/2016  . Community acquired pneumonia 02/28/2016  . [name] (chronic kidney disease) 07/12/2015  . Controlled type 2 diabetes mellitus  with diabetic neuropathy, without long-term current use of insulin ([place]) 06/11/2015  . Arthritis, degenerative 06/11/2015  . Hypercholesteremia 06/11/2015  . 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 Injury of kidney 04/14/2015  . Essential hypertension 04/14/2015  . Blood glucose elevated 04/14/2015  . Apnea, sleep 04/14/2015  . COPD (chronic obstructive pulmonary disease) ([place]) 04/14/2015  . DDD (degenerative disc disease), lumbar 08/01/2014  . Lumbar canal stenosis 05/11/2014  . Neuritis or radiculitis due to rupture of lumbar intervertebral disc 05/11/2014  . 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 Lumbosacral spondylosis 05/11/2014  . Thoracic and lumbosacral neuritis 05/11/2014  . Accelerating angina ([place]) 03/13/2014    Past Surgical History:  Procedure Laterality Date  . ABDOMINAL HYSTERECTOMY    . SPINE SURGERY      Prior to Admission medications   Medication Sig Start Date End Date Taking? Authorizing Provider  albuterol (PROVENTIL) (2.5 MG/3ML) 0.083% nebulizer solution Take 3 mLs (2.5 mg total) by nebulization every 6 (six) hours as needed for wheezing or shortness of breath. 04/08/18  Yes Delman Kitten, MD  albuterol (VENTOLIN HFA) 108 (90 Base) MCG/ACT inhaler Inhale 2 puffs into the lungs every 6 (six) hours as needed for wheezing or shortness of breath. 05/12/17  Yes Karamalegos, Devonne Doughty, DO  aspirin EC 325 MG tablet Take 325 mg by mouth daily.   Yes [provider]  carvedilol (COREG) 25 MG tablet Take 1 tablet (25 mg total) by mouth 2 (two) times daily. 11/24/17  Yes Karamalegos, Devonne Doughty, DO  clonazePAM (KLONOPIN) 0.5 MG tablet Take 0.5 mg by  mouth at bedtime.  07/04/15  Yes [provider]  doxycycline (VIBRAMYCIN) 100 MG capsule Take 100 mg by mouth daily.  08/26/17  Yes [provider]  Fluticasone-Salmeterol (ADVAIR DISKUS) 250-50 MCG/DOSE AEPB Inhale 1 puff into the lungs 2 (two) times daily. 05/04/17  Yes  Karamalegos, Devonne Doughty, DO  furosemide (LASIX) 20 MG tablet Take 2 tablets (40 mg total) by mouth daily. Patient taking differently: Take 20 mg by mouth daily. May take 40 mg if needed 08/31/17  Yes Mody, Ulice Bold, MD  gabapentin (NEURONTIN) 800 MG tablet Take 1 tablet (800 mg total) by mouth 3 (three) times daily. 12/01/16  Yes Arlis Porta., MD  irbesartan-hydrochlorothiazide (AVALIDE) 300-12.5 MG tablet Take 1 tablet by mouth daily.  08/06/17  Yes [provider]  metFORMIN (GLUCOPHAGE) 500 MG tablet TAKE ONE TABLET BY MOUTH EVERY MORNING WITH BREAKFAST 03/24/17  Yes Arlis Porta., MD  montelukast (SINGULAIR) 10 MG tablet TAKE ONE TABLET BY MOUTH AT BEDTIME 03/24/17  Yes Arlis Porta., MD  pantoprazole (PROTONIX) 40 MG tablet Take 40 mg by mouth daily. 12/08/17 12/08/18 Yes [provider]  potassium chloride (K-DUR) 10 MEQ tablet Take 1 tablet (10 mEq total) by mouth 2 (two) times daily. 10/12/17  Yes Karamalegos, Alexander J, DO  tiotropium (SPIRIVA HANDIHALER) 18 MCG inhalation capsule Place 1 capsule (18 mcg total) into inhaler and inhale daily. 08/27/17  Yes Karamalegos, Devonne Doughty, DO  valACYclovir (VALTREX) 1000 MG tablet Take 1 tablet (1,000 mg total) by mouth daily as needed (for fever blisters). 04/19/16  Yes Gladstone Lighter, MD  XTAMPZA ER 9 MG C12A Take 1 capsule by mouth 2 (two) times daily. 04/28/18  Yes [provider]  ACCU-CHEK AVIVA PLUS test strip 1 each by Other route 2 (two) times daily. 09/08/17   Karamalegos, Devonne Doughty, DO  ACCU-CHEK SOFTCLIX LANCETS lancets 1 each by Other route 2 (two) times daily. 09/08/17   Karamalegos, Devonne Doughty, DO  azithromycin (ZITHROMAX Z-PAK) 250 MG tablet 1 tab by mouth daily for next 4 additional days (start 04/09/2018) Patient not taking: Reported on 05/25/2018 04/08/18   Delman Kitten, MD  azithromycin (ZITHROMAX) 500 MG tablet Take 1 tablet (500 mg total) by mouth daily for 3 days. Take 1 tablet daily for  3 days. 05/25/18 05/28/18  Max Sane, MD  Blood Glucose Monitoring Suppl (ACCU-CHEK AVIVA PLUS) w/Device KIT USE AS DIRECTED 01/12/17   Arlis Porta., MD  cloNIDine (CATAPRES) 0.1 MG tablet Take 1 tablet (0.1 mg total) by mouth 2 (two) times daily. Take 1 tab up to 3 times a day for any BPs >170.  Hold dose if BP lower than 170. Patient not taking: Reported on 04/08/2018 12/19/17   Hillary Bow, MD  OXYCONTIN 10 MG 12 hr tablet Take 10 mg by mouth 2 (two) times daily.    [provider]  OXYGEN Inhale 2 L into the lungs continuous.    [provider]  polyethylene glycol powder (GLYCOLAX/MIRALAX) powder MIX 17 GRAMS OF POWDER IN 4 TO 8 OUNCES OF FLUID AND DRINK BY MOUTH ONCE A DAY 05/10/18   [provider]  predniSONE (DELTASONE) 20 MG tablet Take 2 tablets (40 mg total) by mouth daily. Patient not taking: Reported on 05/25/2018 04/08/18   Delman Kitten, MD  predniSONE (STERAPRED UNI-PAK 21 TAB) 10 MG (21) TBPK tablet Start 60 mg daily, taper 10 mg daily until done 05/25/18   Max Sane, MD  sucralfate (CARAFATE) 1  g tablet Take 1 tablet by mouth 4 (four) times daily -  before meals and at bedtime. 02/15/18 02/15/19  [provider]    Allergies Nitrofurantoin  Family History  Problem Relation Age of Onset  . Hypertension Mother   . Colon cancer Father   . Hypertension Paternal Grandfather     Social History Social History   Tobacco Use  . Smoking status: Former Smoker    Packs/day: 0.50    Last attempt to quit: 04/28/2015    Years since quitting: 3.0  . Smokeless tobacco: Never Used  Substance Use Topics  . Alcohol use: No    Alcohol/week: 0.0 oz  . Drug use: No    Review of Systems Constitutional: No fever/chills Eyes: No visual changes. ENT: No sore throat. Cardiovascular: Denies chest pain. Respiratory: Positive for shortness of breath. Gastrointestinal: No abdominal pain.  No nausea, no vomiting.  No diarrhea.  No  constipation. Genitourinary: Negative for dysuria. Musculoskeletal: Negative for neck pain.  Negative for back pain. Integumentary: Negative for rash. Neurological: Negative for headaches, focal weakness or numbness.  ____________________________________________   PHYSICAL EXAM:  VITAL SIGNS: ED Triage Vitals  Enc Vitals Group     BP 05/24/18 2223 (!) 152/72     Pulse Rate 05/24/18 2223 78     Resp 05/24/18 2223 (!) 28     Temp 05/24/18 2223 98.9 F (37.2 C)     Temp Source 05/24/18 2223 Oral     SpO2 05/24/18 2223 (!) 86 %     Weight 05/24/18 2223 96.2 kg (212 lb)     Height 05/25/18 0520 1.575 m (5' 2")     Head Circumference --      Peak Flow --      Pain Score 05/25/18 0231 0     Pain Loc --      Pain Edu? --      Excl. in [place]? --     Constitutional: Alert and oriented.  Apparent dyspnea eyes: Conjunctivae are normal.  Head: Atraumatic. Mouth/Throat: Mucous membranes are moist. Oropharynx non-erythematous. Neck: No stridor.   Cardiovascular: Normal rate, regular rhythm. Good peripheral circulation. Grossly normal heart sounds. Respiratory: Apparent respiratory difficulty, bibasilar Rales and wheezes noted.   Gastrointestinal: Soft and nontender. No distention.  Musculoskeletal: No lower extremity tenderness nor edema. No gross deformities of extremities. Neurologic:  Normal speech and language. No gross focal neurologic deficits are appreciated.  Skin:  Skin is warm, dry and intact. No rash noted. Psychiatric: Mood and affect are normal. Speech and behavior are normal.  ____________________________________________   LABS (all labs ordered are listed, but only abnormal results are displayed)  Labs Reviewed  BASIC METABOLIC PANEL - Abnormal; Notable for the following components:      Result Value   Glucose, Bld 200 (*)    BUN 27 (*)    Calcium 8.8 (*)    All other components within normal limits  CBC - Abnormal; Notable for the following components:   WBC 11.7  (*)    Hemoglobin 11.7 (*)    All other components within normal limits  TROPONIN I - Abnormal; Notable for the following components:   Troponin I 0.03 (*)    All other components within normal limits  TROPONIN I - Abnormal; Notable for the following components:   Troponin I 0.04 (*)    All other components within normal limits  BRAIN NATRIURETIC PEPTIDE - Abnormal; Notable for the following components:   B Natriuretic Peptide 370.0 (*)  All other components within normal limits  TROPONIN I - Abnormal; Notable for the following components:   Troponin I 0.04 (*)    All other components within normal limits  BASIC METABOLIC PANEL - Abnormal; Notable for the following components:   Chloride 100 (*)    Glucose, Bld 214 (*)    BUN 24 (*)    Calcium 8.7 (*)    GFR calc non Af Amer 59 (*)    All other components within normal limits  GLUCOSE, CAPILLARY - Abnormal; Notable for the following components:   Glucose-Capillary 193 (*)    All other components within normal limits  GLUCOSE, CAPILLARY - Abnormal; Notable for the following components:   Glucose-Capillary 291 (*)    All other components within normal limits   ____________________________________________  EKG  ED ECG REPORT I, [place] N BROWN, the attending physician, personally viewed and interpreted this ECG.   Date: 05/25/2018  EKG Time: 10:19 PM  Rate: 77  Rhythm: Normal sinus rhythm  Axis: Normal  Intervals: Normal  ST&T Change: None  ____________________________________________  RADIOLOGY I, [place] N BROWN, personally viewed and evaluated these images (plain radiographs) as part of my medical decision making, as well as reviewing the written report by the radiologist.  ED MD interpretation:     ___CLINICAL DATA:  Worsening chronic dyspnea on exertion. Reported history of smoking. History of CHF and COPD. Oxygen-dependent.  EXAM: CT CHEST WITH CONTRAST  TECHNIQUE: Multidetector CT imaging of the  chest was performed during intravenous contrast administration.  CONTRAST:  94m ISOVUE-300 IOPAMIDOL (ISOVUE-300) INJECTION 61%  COMPARISON:  04/08/2018 chest radiograph.  FINDINGS: Cardiovascular: Mild cardiomegaly. Small pericardial effusion/thickening. Left anterior descending and right coronary atherosclerosis. Atherosclerotic nonaneurysmal thoracic aorta. Dilated main pulmonary artery (3.6 cm diameter). No central pulmonary emboli.  Mediastinum/Nodes: Subcentimeter hypodense posterior left thyroid lobe nodule. Unremarkable esophagus. No axillary adenopathy. Enlarged right paratracheal nodes up to 1.6 cm (series 2/image 54). Enlarged 1.1 cm AP window node (series 2/image 51). No pathologically enlarged hilar nodes.  Lungs/Pleura: No pneumothorax. Trace dependent left pleural effusion. No right pleural effusion. No acute consolidative airspace disease, lung masses or significant pulmonary nodules. Minimal interlobular septal thickening throughout both lungs. Several mildly thickened parenchymal bands in the dependent right middle lobe, lingula and right lower lobe. No significant regions of bronchiectasis. Mild centrilobular emphysema with mild diffuse bronchial wall thickening.  Upper abdomen: Partially visualized simple 1.6 cm medial upper right renal cyst. Mild scarring in the visualized upper right kidney.  Musculoskeletal: No aggressive appearing focal osseous lesions. Stable mild chronic L1 vertebral compression fracture. Moderate thoracic spondylosis.  IMPRESSION: 1. Mild cardiomegaly.  Small pericardial effusion/thickening. 2. Minimal interlobular septal thickening in both lungs, suggesting minimal cardiogenic pulmonary edema. Trace dependent left pleural effusion. 3. Dilated main pulmonary artery, suggesting pulmonary arterial hypertension. 4. Mild-to-moderate parenchymal banding at the lung bases, which may be due to platelike subsegmental atelectasis  or nonspecific postinfectious/postinflammatory scarring. 5. Nonspecific mild-to-moderate mediastinal lymphadenopathy. Recommend attention on follow-up chest CT with IV contrast in 3-6 months. 6. Mild centrilobular emphysema with mild diffuse bronchial wall thickening, compatible with the provided history of COPD. 7. Two vessel coronary atherosclerosis.  Aortic Atherosclerosis (ICD10-I70.0) and Emphysema (ICD10-J43.9).   Electronically Signed   By: JIlona SorrelM.D.   On: 05/24/2018 16:37_________________________________________    .Critical Care Performed by: BGregor Hams MD Authorized by: BGregor Hams MD   Critical care provider statement:    Critical care time (minutes):  30   Critical  care time was exclusive of:  Separately billable procedures and treating other patients   Critical care was necessary to treat or prevent imminent or life-threatening deterioration of the following conditions:  Respiratory failure   Critical care was time spent personally by me on the following activities:  Development of treatment plan with patient or surrogate, discussions with consultants, evaluation of patient's response to treatment, examination of patient, obtaining history from patient or surrogate, ordering and performing treatments and interventions, ordering and review of laboratory studies, ordering and review of radiographic studies, pulse oximetry, re-evaluation of patient's condition and review of old charts   I assumed direction of critical care for this patient from another provider in my specialty: no       ____________________________________________   INITIAL IMPRESSION / ASSESSMENT AND PLAN / ED COURSE  As part of my medical decision making, I reviewed the following data within the electronic MEDICAL RECORD NUMBER  77 year old female presented with above-stated history and physical exam concerning for COPD versus CHF exacerbation.  Patient had a CT scan performed  earlier in the day which revealed evidence of some pulmonary edema and a left pleural effusion.  Patient given a DuoNeb in the emergency department as well as Lasix with some improvement of symptoms.  Laboratory data notable for troponin of 0.03 patient discussed with Dr. Denice Bors for hospital admission further evaluation and management    ____________________________________________  FINAL CLINICAL IMPRESSION(S) / ED DIAGNOSES  Final diagnoses:  Acute pulmonary edema ([place])     MEDICATIONS GIVEN DURING THIS VISIT:  Medications  ipratropium-albuterol (DUONEB) 0.5-2.5 (3) MG/3ML nebulizer solution 3 mL (3 mLs Nebulization Given 05/25/18 0257)  methylPREDNISolone sodium succinate (SOLU-MEDROL) 125 mg/2 mL injection 125 mg (125 mg Intravenous Given 05/25/18 0256)  ipratropium-albuterol (DUONEB) 0.5-2.5 (3) MG/3ML nebulizer solution 3 mL (3 mLs Nebulization Given 05/25/18 0257)  furosemide (LASIX) injection 40 mg (40 mg Intravenous Given 05/25/18 0306)     ED Discharge Orders        Ordered    Increase activity slowly     05/25/18 1125    Diet - low sodium heart healthy     05/25/18 1125    predniSONE (STERAPRED UNI-PAK 21 TAB) 10 MG (21) TBPK tablet     05/25/18 1135    azithromycin (ZITHROMAX) 500 MG tablet  Daily     05/25/18 1135       Note:  This document was prepared using Dragon voice recognition software and may include unintentional dictation errors.    Gregor Hams, MD 05/25/18 2252

## 2018-05-25 NOTE — Progress Notes (Signed)
*  PRELIMINARY RESULTS* Echocardiogram 2D Echocardiogram has been performed.  Megan Bolton 05/25/2018, 10:24 AM

## 2018-05-25 NOTE — H&P (Signed)
[facility] [place] NAME: Megan Bolton    MR#:  [id number]  DATE OF BIRTH:  04-22-1941  DATE OF ADMISSION:  05/25/2018  PRIMARY CARE PHYSICIAN: Baxter Hire, MD   REQUESTING/REFERRING PHYSICIAN: Gregor Hams, MD  CHIEF COMPLAINT:   Chief Complaint  Patient presents with  . Chills  . Shortness of Breath    HISTORY OF PRESENT ILLNESS:  Megan Bolton  is a 77 y.o. female with a known history of COPD (2L) p/w SOB. Endorse chronic mild SOB at baseline, acutely worse/severe @~2000PM on Tuesday 05/24/2018. Denies cough, CP, hemoptysis, wheezing. Had a headache, but says it has improved. Endorses adherence to medication and low-sodium diet. Is otherwise w/o complaint. Wears CPAP at home, but does not want CPAP in the hospital.  PAST MEDICAL HISTORY:   Past Medical History:  Diagnosis Date  . CHF (congestive heart failure) ([place])   . COPD (chronic obstructive pulmonary disease) ([place])   . Diabetes mellitus without complication ([place])   . Diastolic heart failure ([place])   . Hyperlipidemia   . Hypertension     PAST SURGICAL HISTORY:   Past Surgical History:  Procedure Laterality Date  . ABDOMINAL HYSTERECTOMY    . SPINE SURGERY      SOCIAL HISTORY:   Social History   Tobacco Use  . Smoking status: Former Smoker    Packs/day: 0.50    Last attempt to quit: 04/28/2015    Years since quitting: 3.0  . Smokeless tobacco: Never Used  Substance Use Topics  . Alcohol use: No    Alcohol/week: 0.0 oz    FAMILY HISTORY:   Family History  Problem Relation Age of Onset  . Hypertension Mother   . Colon cancer Father   . Hypertension Paternal Grandfather     DRUG ALLERGIES:   Allergies  Allergen Reactions  . Nitrofurantoin Anaphylaxis    REVIEW OF SYSTEMS:   Review of Systems  Constitutional: Negative for chills, diaphoresis, fever, malaise/fatigue and weight loss.  HENT: Negative for congestion, ear pain, hearing  loss, nosebleeds, sinus pain, sore throat and tinnitus.   Eyes: Negative for blurred vision, double vision and photophobia.  Respiratory: Positive for shortness of breath. Negative for cough, hemoptysis, sputum production and wheezing.   Cardiovascular: Negative for chest pain, palpitations, orthopnea, claudication, leg swelling and PND.  Gastrointestinal: Negative for abdominal pain, blood in stool, constipation, diarrhea, heartburn, melena, nausea and vomiting.  Genitourinary: Negative for dysuria, frequency, hematuria and urgency.  Musculoskeletal: Negative for back pain, joint pain, myalgias and neck pain.  Skin: Negative for itching and rash.  Neurological: Negative for dizziness, tingling, tremors, sensory change, speech change, focal weakness, seizures, loss of consciousness, weakness and headaches.  Psychiatric/Behavioral: Negative for memory loss. The patient does not have insomnia.    MEDICATIONS AT HOME:   Prior to Admission medications   Medication Sig Start Date End Date Taking? Authorizing Provider  albuterol (PROVENTIL) (2.5 MG/3ML) 0.083% nebulizer solution Take 3 mLs (2.5 mg total) by nebulization every 6 (six) hours as needed for wheezing or shortness of breath. 04/08/18  Yes Delman Kitten, MD  albuterol (VENTOLIN HFA) 108 (90 Base) MCG/ACT inhaler Inhale 2 puffs into the lungs every 6 (six) hours as needed for wheezing or shortness of breath. 05/12/17  Yes Karamalegos, Devonne Doughty, DO  aspirin EC 325 MG tablet Take 325 mg by mouth daily.   Yes [provider]  carvedilol (COREG) 25 MG tablet Take 1 tablet (  25 mg total) by mouth 2 (two) times daily. 11/24/17  Yes Karamalegos, Devonne Doughty, DO  clonazePAM (KLONOPIN) 0.5 MG tablet Take 0.5 mg by mouth at bedtime.  07/04/15  Yes [provider]  doxycycline (VIBRAMYCIN) 100 MG capsule Take 100 mg by mouth daily.  08/26/17  Yes [provider]  Fluticasone-Salmeterol (ADVAIR DISKUS) 250-50 MCG/DOSE AEPB Inhale 1  puff into the lungs 2 (two) times daily. 05/04/17  Yes Karamalegos, Devonne Doughty, DO  furosemide (LASIX) 20 MG tablet Take 2 tablets (40 mg total) by mouth daily. Patient taking differently: Take 20 mg by mouth daily. May take 40 mg if needed 08/31/17  Yes Mody, Ulice Bold, MD  gabapentin (NEURONTIN) 800 MG tablet Take 1 tablet (800 mg total) by mouth 3 (three) times daily. 12/01/16  Yes Arlis Porta., MD  irbesartan-hydrochlorothiazide (AVALIDE) 300-12.5 MG tablet Take 1 tablet by mouth daily.  08/06/17  Yes [provider]  metFORMIN (GLUCOPHAGE) 500 MG tablet TAKE ONE TABLET BY MOUTH EVERY MORNING WITH BREAKFAST 03/24/17  Yes Arlis Porta., MD  montelukast (SINGULAIR) 10 MG tablet TAKE ONE TABLET BY MOUTH AT BEDTIME 03/24/17  Yes Arlis Porta., MD  pantoprazole (PROTONIX) 40 MG tablet Take 40 mg by mouth daily. 12/08/17 12/08/18 Yes [provider]  potassium chloride (K-DUR) 10 MEQ tablet Take 1 tablet (10 mEq total) by mouth 2 (two) times daily. 10/12/17  Yes Karamalegos, Alexander J, DO  tiotropium (SPIRIVA HANDIHALER) 18 MCG inhalation capsule Place 1 capsule (18 mcg total) into inhaler and inhale daily. 08/27/17  Yes Karamalegos, Devonne Doughty, DO  valACYclovir (VALTREX) 1000 MG tablet Take 1 tablet (1,000 mg total) by mouth daily as needed (for fever blisters). 04/19/16  Yes Gladstone Lighter, MD  XTAMPZA ER 9 MG C12A Take 1 capsule by mouth 2 (two) times daily. 04/28/18  Yes [provider]  ACCU-CHEK AVIVA PLUS test strip 1 each by Other route 2 (two) times daily. 09/08/17   Karamalegos, Devonne Doughty, DO  ACCU-CHEK SOFTCLIX LANCETS lancets 1 each by Other route 2 (two) times daily. 09/08/17   Karamalegos, Devonne Doughty, DO  azithromycin (ZITHROMAX Z-PAK) 250 MG tablet 1 tab by mouth daily for next 4 additional days (start 04/09/2018) Patient not taking: Reported on 05/25/2018 04/08/18   Delman Kitten, MD  Blood Glucose Monitoring Suppl (ACCU-CHEK AVIVA PLUS) w/Device KIT  USE AS DIRECTED 01/12/17   Arlis Porta., MD  cloNIDine (CATAPRES) 0.1 MG tablet Take 1 tablet (0.1 mg total) by mouth 2 (two) times daily. Take 1 tab up to 3 times a day for any BPs >170.  Hold dose if BP lower than 170. Patient not taking: Reported on 04/08/2018 12/19/17   Hillary Bow, MD  OXYCONTIN 10 MG 12 hr tablet Take 10 mg by mouth 2 (two) times daily.    [provider]  OXYGEN Inhale 2 L into the lungs continuous.    [provider]  polyethylene glycol powder (GLYCOLAX/MIRALAX) powder MIX 17 GRAMS OF POWDER IN 4 TO 8 OUNCES OF FLUID AND DRINK BY MOUTH ONCE A DAY 05/10/18   [provider]  predniSONE (DELTASONE) 20 MG tablet Take 2 tablets (40 mg total) by mouth daily. Patient not taking: Reported on 05/25/2018 04/08/18   Delman Kitten, MD  sucralfate (CARAFATE) 1 g tablet Take 1 tablet by mouth 4 (four) times daily -  before meals and at bedtime. 02/15/18 02/15/19  [provider]      VITAL SIGNS:  Blood pressure (!) 152/91, pulse 80, temperature 99.3 F (37.4 C), resp. rate (!) 22, weight 96.2 kg (212 lb), SpO2 91 %.  PHYSICAL EXAMINATION:  Physical Exam  Constitutional: She is oriented to person, place, and time. She appears well-developed and well-nourished. She is active and cooperative.  Non-toxic appearance. She does not have a sickly appearance. She does not appear ill. No distress. She is not intubated.  HENT:  Head: Normocephalic and atraumatic.  Mouth/Throat: Oropharynx is clear and moist. No oropharyngeal exudate or posterior oropharyngeal edema.  Eyes: Conjunctivae, EOM and lids are normal. No scleral icterus.  Neck: Neck supple. No JVD present. No thyromegaly present.  Cardiovascular: Normal rate, regular rhythm, S1 normal, S2 normal and normal heart sounds.  No extrasystoles are present. Exam reveals no gallop, no S3, no S4, no distant heart sounds and no friction rub.  No murmur heard. Pulmonary/Chest: Effort normal. No  accessory muscle usage or stridor. No apnea, no tachypnea and no bradypnea. She is not intubated. No respiratory distress. She has decreased breath sounds in the right upper field, the right middle field, the right lower field, the left upper field, the left middle field and the left lower field. She has no wheezes. She has no rhonchi. She has no rales.  Abdominal: Soft. Bowel sounds are normal. She exhibits no distension. There is no tenderness. There is no rebound and no guarding.  Musculoskeletal: Normal range of motion. She exhibits no edema or tenderness.       Right lower leg: Normal. She exhibits no tenderness and no edema.       Left lower leg: Normal. She exhibits no tenderness and no edema.  Lymphadenopathy:    She has no cervical adenopathy.  Neurological: She is alert and oriented to person, place, and time. She is not disoriented.  Skin: Skin is warm, dry and intact. No rash noted. She is not diaphoretic. No erythema.  Psychiatric: She has a normal mood and affect. Her speech is normal and behavior is normal. Judgment and thought content normal. Her mood appears not anxious. She is not agitated. Cognition and memory are normal.   (-) JVD, (-) leg edema, (-) R/R/W. LABORATORY PANEL:   CBC Recent Labs  Lab 05/24/18 2227  WBC 11.7*  HGB 11.7*  HCT 36.5  PLT 192   ------------------------------------------------------------------------------------------------------------------  Chemistries  Recent Labs  Lab 05/24/18 2227  NA 138  K 4.1  CL 102  CO2 29  GLUCOSE 200*  BUN 27*  CREATININE 0.88  CALCIUM 8.8*   ------------------------------------------------------------------------------------------------------------------  Cardiac Enzymes Recent Labs  Lab 05/24/18 2227  TROPONINI 0.03*   ------------------------------------------------------------------------------------------------------------------  RADIOLOGY:  Ct Chest W Contrast  Result Date:  05/24/2018 CLINICAL DATA:  Worsening chronic dyspnea on exertion. Reported history of smoking. History of CHF and COPD. Oxygen-dependent. EXAM: CT CHEST WITH CONTRAST TECHNIQUE: Multidetector CT imaging of the chest was performed during intravenous contrast administration. CONTRAST:  90m ISOVUE-300 IOPAMIDOL (ISOVUE-300) INJECTION 61% COMPARISON:  04/08/2018 chest radiograph. FINDINGS: Cardiovascular: Mild cardiomegaly. Small pericardial effusion/thickening. Left anterior descending and right coronary atherosclerosis. Atherosclerotic nonaneurysmal thoracic aorta. Dilated main pulmonary artery (3.6 cm diameter). No central pulmonary emboli. Mediastinum/Nodes: Subcentimeter hypodense posterior left thyroid lobe nodule. Unremarkable esophagus. No axillary adenopathy. Enlarged right paratracheal nodes up to 1.6 cm (series 2/image 54). Enlarged 1.1 cm AP window node (series 2/image 51). No pathologically enlarged hilar nodes. Lungs/Pleura: No pneumothorax. Trace dependent left pleural effusion. No right pleural effusion. No acute consolidative airspace disease, lung masses or significant  pulmonary nodules. Minimal interlobular septal thickening throughout both lungs. Several mildly thickened parenchymal bands in the dependent right middle lobe, lingula and right lower lobe. No significant regions of bronchiectasis. Mild centrilobular emphysema with mild diffuse bronchial wall thickening. Upper abdomen: Partially visualized simple 1.6 cm medial upper right renal cyst. Mild scarring in the visualized upper right kidney. Musculoskeletal: No aggressive appearing focal osseous lesions. Stable mild chronic L1 vertebral compression fracture. Moderate thoracic spondylosis. IMPRESSION: 1. Mild cardiomegaly.  Small pericardial effusion/thickening. 2. Minimal interlobular septal thickening in both lungs, suggesting minimal cardiogenic pulmonary edema. Trace dependent left pleural effusion. 3. Dilated main pulmonary artery,  suggesting pulmonary arterial hypertension. 4. Mild-to-moderate parenchymal banding at the lung bases, which may be due to platelike subsegmental atelectasis or nonspecific postinfectious/postinflammatory scarring. 5. Nonspecific mild-to-moderate mediastinal lymphadenopathy. Recommend attention on follow-up chest CT with IV contrast in 3-6 months. 6. Mild centrilobular emphysema with mild diffuse bronchial wall thickening, compatible with the provided history of COPD. 7. Two vessel coronary atherosclerosis. Aortic Atherosclerosis (ICD10-I70.0) and Emphysema (ICD10-J43.9). Electronically Signed   By: Ilona Sorrel M.D.   On: 05/24/2018 16:37   IMPRESSION AND PLAN:   A/P: 42F acute on chronic SOB, acute on chronic hypoxemic respiratory failure. Not clinically in CHF. Treat for mild acute COPD exacerbation. Also w/ T2NIDDM/hyperglycemia, azotemia, troponin elevation, normocytic anemia. -(-) JVD, (-) leg edema, (-) rales/rhonchi. Labwork suggestive of intravascular volume depletion, BUN 27, Cr 0.88, ratio ~30.7. BNP pending. I do not believe this pt is clinically in an acute CHF exacerbation on present admission, based on my exam and assessment. -Reportedly wheezing mildly prior to my assessment, lungs clear at the time of my assessment -Tele, cardiac monitoring -c/w home Lasix -DuoNebs -Steroids -No ABx -Incentive spirometry, pulmonary toileting -Refused CPAP -Pulse oximetry -EF 55% as of 01/27/2017 Echo, no indication of systolic or diastolic heart failure based on results, rpt Echo pending -SSI, hold PO antihyperglycemics -BUN 27, Cr at baseline -Troponin 0.03, marginal elevation chronic for at least 75mo-Normocytic anemia, likely anemia of chronic disease. No evidence for acute blood loss. -c/w remaining home meds, formulary subs -Cardiac diabetic diet -Lovenox -Full code -Observation, < 2 midnights   All the records are reviewed and case discussed with ED provider. Management plans  discussed with the patient, family and they are in agreement.  CODE STATUS: Full code  TOTAL TIME TAKING CARE OF THIS PATIENT: 75 minutes.    PArta SilenceM.D on 05/25/2018 at 5:13 AM  Between 7am to 6pm - Pager - [phone]  After 6pm go to [email or web address] - pProofreader [facility]  Office  [phone] CC: Primary care physician; JBaxter Hire MD   Note: This dictation was prepared with Dragon dictation along with smaller phrase technology. Any transcriptional errors that result from this process are unintentional.

## 2018-05-25 NOTE — Care Management Obs Status (Signed)
[facility] NOTIFICATION   Patient Details  Name: NATOSHIA SOUTER MRN: [id number] Date of Birth: 09-12-1941   Medicare Observation Status Notification Given:  Yes    Shelbie Ammons, RN 05/25/2018, 9:33 AM

## 2018-05-25 NOTE — Discharge Instructions (Signed)

## 2018-05-25 NOTE — Care Management Note (Signed)
Case Management Note  Patient Details  Name: Megan Bolton MRN: [id number] Date of Birth: 19-Jun-1941  Subjective/Objective:    Admitted to [facility] under observation status with the diagnosis of acute hypoxemia. Daughter, Annell Greening, is in the home. [phone]). Prescriptions are filled at [facility]. Last seen Dr. Harrel Lemon about a month ago. Home Health per [facility] in the past. No skilled nursing. Home oxygen per [employer] x 2 years. Uses 2 liters continuous. CPAP in the home. Takes care of all basic activities of daily living herself, doesn't drive. Daughter helps with errands. No falls. Weight  goes up then down  1-2 pounds.  Family will transport.                Action/Plan: Will continue to follow for discharge plans   Expected Discharge Date:                  Expected Discharge Plan:     In-House Referral:     Discharge planning Services     Post Acute Care Choice:    Choice offered to:     DME Arranged:    DME Agency:     HH Arranged:    HH Agency:     Status of Service:     If discussed at [employer] of Stay Meetings, dates discussed:    Additional Comments:  Shelbie Ammons, RN  MSN CCM Care Management [phone] 05/25/2018, 9:23 AM

## 2018-05-25 NOTE — ED Notes (Signed)
Pt with gradual increase in shortness of breath over the past couple weeks. 5/28 Chest CT in [place]. O2 91% at present on 4L

## 2018-05-25 NOTE — ED Notes (Signed)
Patient transported to 123 

## 2018-05-25 NOTE — Plan of Care (Signed)
  Problem: Education: Goal: Knowledge of General Education information will improve Outcome: Progressing   Problem: Health Behavior/Discharge Planning: Goal: Ability to manage health-related needs will improve Outcome: Progressing   Problem: Clinical Measurements: Goal: Ability to maintain clinical measurements within normal limits will improve Outcome: Progressing   Problem: Elimination: Goal: Will not experience complications related to urinary retention Outcome: Progressing   Problem: Pain Managment: Goal: General experience of comfort will improve Outcome: Progressing   Problem: Safety: Goal: Ability to remain free from injury will improve Outcome: Progressing   Problem: Skin Integrity: Goal: Risk for impaired skin integrity will decrease Outcome: Progressing

## 2018-05-26 LAB — ECHOCARDIOGRAM COMPLETE
HEIGHTINCHES: 62 in
Weight: 3454.4 oz

## 2018-05-27 NOTE — Discharge Summary (Signed)
[facility] NAME: Megan Bolton    MR#:  [id number]  DATE OF BIRTH:  [birth year]  DATE OF ADMISSION:  05/25/2018   ADMITTING PHYSICIAN: Arta Silence, MD  DATE OF DISCHARGE: 05/25/2018  2:14 PM  PRIMARY CARE PHYSICIAN: Baxter Hire, MD   ADMISSION DIAGNOSIS:  SOB/COPD DISCHARGE DIAGNOSIS:  Active Problems:   Acute hypoxemic respiratory failure ([place])  SECONDARY DIAGNOSIS:   Past Medical History:  Diagnosis Date  . CHF (congestive heart failure) ([place])   . COPD (chronic obstructive pulmonary disease) ([place])   . Diabetes mellitus without complication ([place])   . Diastolic heart failure ([place])   . Hyperlipidemia   . Hypertension    HOSPITAL COURSE:  77F with K/H/O DM, COPD (on 2 Liters chronic O2), Depression, sleep apnea, HTN being admitted for acute on chronic SOB likely due to mild copd exacerbation.  * acute on chronic hypoxemic respiratory failure. Not clinically in CHF.  - Treat for mild acute COPD exacerbation - improved with Steroids, empiric Abx and nebs - back to baseline and feels > 85% better and wants to go home  * Mild COPD flare: had CT chest which shows pulmo htn, emphysema  - she follows with Dr Vella Kohler as an outpt - symptoms improving - will discharge home on Prednisone taper and 3 days of Zithromax  * mild elevation of troponins - due to demand ischemia, no MI - echo was ordered on admission and seems wnl  * Anxiety: continue Klonopin DISCHARGE CONDITIONS:  STABLE CONSULTS OBTAINED:  Treatment Team:  Arta Silence, MD DRUG ALLERGIES:   Allergies  Allergen Reactions  . Nitrofurantoin Anaphylaxis   DISCHARGE MEDICATIONS:   Allergies as of 05/25/2018      Reactions   Nitrofurantoin Anaphylaxis      Medication List    TAKE these medications   ACCU-CHEK AVIVA PLUS test strip Generic drug:  glucose blood 1 each by Other route 2 (two) times daily.   ACCU-CHEK AVIVA PLUS  w/Device Kit USE AS DIRECTED   ACCU-CHEK SOFTCLIX LANCETS lancets 1 each by Other route 2 (two) times daily.   albuterol 108 (90 Base) MCG/ACT inhaler Commonly known as:  VENTOLIN HFA Inhale 2 puffs into the lungs every 6 (six) hours as needed for wheezing or shortness of breath.   albuterol (2.5 MG/3ML) 0.083% nebulizer solution Commonly known as:  PROVENTIL Take 3 mLs (2.5 mg total) by nebulization every 6 (six) hours as needed for wheezing or shortness of breath.   aspirin EC 325 MG tablet Take 325 mg by mouth daily.   azithromycin 250 MG tablet Commonly known as:  ZITHROMAX Z-PAK 1 tab by mouth daily for next 4 additional days (start 04/09/2018) What changed:  Another medication with the same name was added. Make sure you understand how and when to take each.   azithromycin 500 MG tablet Commonly known as:  ZITHROMAX Take 1 tablet (500 mg total) by mouth daily for 3 days. Take 1 tablet daily for 3 days. What changed:  You were already taking a medication with the same name, and this prescription was added. Make sure you understand how and when to take each.   carvedilol 25 MG tablet Commonly known as:  COREG Take 1 tablet (25 mg total) by mouth 2 (two) times daily.   clonazePAM 0.5 MG tablet Commonly known as:  KLONOPIN Take 0.5 mg by mouth at bedtime.   cloNIDine 0.1 MG tablet Commonly known as:  CATAPRES Take 1 tablet (0.1 mg total) by mouth 2 (two) times daily. Take 1 tab up to 3 times a day for any BPs >170.  Hold dose if BP lower than 170.   doxycycline 100 MG capsule Commonly known as:  VIBRAMYCIN Take 100 mg by mouth daily.   Fluticasone-Salmeterol 250-50 MCG/DOSE Aepb Commonly known as:  ADVAIR DISKUS Inhale 1 puff into the lungs 2 (two) times daily.   furosemide 20 MG tablet Commonly known as:  LASIX Take 2 tablets (40 mg total) by mouth daily. What changed:    how much to take  additional instructions   gabapentin 800 MG tablet Commonly known as:   NEURONTIN Take 1 tablet (800 mg total) by mouth 3 (three) times daily.   irbesartan-hydrochlorothiazide 300-12.5 MG tablet Commonly known as:  AVALIDE Take 1 tablet by mouth daily.   metFORMIN 500 MG tablet Commonly known as:  GLUCOPHAGE TAKE ONE TABLET BY MOUTH EVERY MORNING WITH BREAKFAST   montelukast 10 MG tablet Commonly known as:  SINGULAIR TAKE ONE TABLET BY MOUTH AT BEDTIME   OXYCONTIN 10 mg 12 hr tablet Generic drug:  oxyCODONE Take 10 mg by mouth 2 (two) times daily.   OXYGEN Inhale 2 L into the lungs continuous.   pantoprazole 40 MG tablet Commonly known as:  PROTONIX Take 40 mg by mouth daily.   polyethylene glycol powder powder Commonly known as:  GLYCOLAX/MIRALAX MIX 17 GRAMS OF POWDER IN 4 TO 8 OUNCES OF FLUID AND DRINK BY MOUTH ONCE A DAY   potassium chloride 10 MEQ tablet Commonly known as:  K-DUR Take 1 tablet (10 mEq total) by mouth 2 (two) times daily.   predniSONE 20 MG tablet Commonly known as:  DELTASONE Take 2 tablets (40 mg total) by mouth daily. What changed:  Another medication with the same name was added. Make sure you understand how and when to take each.   predniSONE 10 MG (21) Tbpk tablet Commonly known as:  STERAPRED UNI-PAK 21 TAB Start 60 mg daily, taper 10 mg daily until done What changed:  You were already taking a medication with the same name, and this prescription was added. Make sure you understand how and when to take each.   sucralfate 1 g tablet Commonly known as:  CARAFATE Take 1 tablet by mouth 4 (four) times daily -  before meals and at bedtime.   tiotropium 18 MCG inhalation capsule Commonly known as:  SPIRIVA HANDIHALER Place 1 capsule (18 mcg total) into inhaler and inhale daily.   valACYclovir 1000 MG tablet Commonly known as:  VALTREX Take 1 tablet (1,000 mg total) by mouth daily as needed (for fever blisters).   XTAMPZA ER 9 MG C12a Generic drug:  oxyCODONE ER Take 1 capsule by mouth 2 (two) times daily.          DISCHARGE INSTRUCTIONS:   DIET:  Regular diet DISCHARGE CONDITION:  Good ACTIVITY:  Activity as tolerated OXYGEN:  Home Oxygen: Yes.    Oxygen Delivery: 2 liters continuous all the time, CPAP at night DISCHARGE LOCATION:  home with home health, resume [facility], RN  If you experience worsening of your admission symptoms, develop shortness of breath, life threatening emergency, suicidal or homicidal thoughts you must seek medical attention immediately by calling 911 or calling your MD immediately  if symptoms less severe.  You Must read complete instructions/literature along with all the possible adverse reactions/side effects for all the Medicines you take and that have been prescribed to you.  Take any new Medicines after you have completely understood and accpet all the possible adverse reactions/side effects.   Please note  You were cared for by a hospitalist during your hospital stay. If you have any questions about your discharge medications or the care you received while you were in the hospital after you are discharged, you can call the unit and asked to speak with the hospitalist on call if the hospitalist that took care of you is not available. Once you are discharged, your primary care physician will handle any further medical issues. Please note that NO REFILLS for any discharge medications will be authorized once you are discharged, as it is imperative that you return to your primary care physician (or establish a relationship with a primary care physician if you do not have one) for your aftercare needs so that they can reassess your need for medications and monitor your lab values.    On the day of Discharge:  VITAL SIGNS:  Blood pressure 128/63, pulse 79, temperature 99.1 F (37.3 C), temperature source Oral, resp. rate 16, height _0  (1.575 m), weight 97.9 kg (215 lb 14.4 oz), SpO2 96 %. PHYSICAL EXAMINATION:  GENERAL:  77 y.o.-year-old patient lying in the  bed with no acute distress.  EYES: Pupils equal, round, reactive to light and accommodation. No scleral icterus. Extraocular muscles intact.  HEENT: Head atraumatic, normocephalic. Oropharynx and nasopharynx clear.  NECK:  Supple, no jugular venous distention. No thyroid enlargement, no tenderness.  LUNGS: Normal breath sounds bilaterally, no wheezing, rales,rhonchi or crepitation. No use of accessory muscles of respiration.  CARDIOVASCULAR: S1, S2 normal. No murmurs, rubs, or gallops.  ABDOMEN: Soft, non-tender, non-distended. Bowel sounds present. No organomegaly or mass.  EXTREMITIES: No pedal edema, cyanosis, or clubbing.  NEUROLOGIC: Cranial nerves II through XII are intact. Muscle strength 5/5 in all extremities. Sensation intact. Gait not checked.  PSYCHIATRIC: The patient is alert and oriented x 3.  SKIN: No obvious rash, lesion, or ulcer.  DATA REVIEW:   CBC Recent Labs  Lab 05/24/18 2227  WBC 11.7*  HGB 11.7*  HCT 36.5  PLT 192    Chemistries  Recent Labs  Lab 05/25/18 0757  NA 137  K 4.1  CL 100*  CO2 28  GLUCOSE 214*  BUN 24*  CREATININE 0.92  CALCIUM 8.7*     Follow-up Information    Baxter Hire, MD. Go on 05/30/2018.   Specialty:  Internal Medicine Why:  _1 :00 AM Contact information: [place] [address] [phone]           Management plans discussed with the patient, family and they are in agreement.  CODE STATUS: Prior   TOTAL TIME TAKING CARE OF THIS PATIENT: 35 minutes.    Max Sane M.D on 05/27/2018 at 5:56 PM  Between 7am to 6pm - Pager - [phone]  After 6pm go to [email or web address] - Proofreader  [facility]  Office  [phone]  CC: Primary care physician; Baxter Hire, MD   Note: This dictation was prepared with Dragon dictation along with smaller phrase technology. Any transcriptional errors that result from this process are unintentional.

## 2018-05-30 DIAGNOSIS — R0609 Other forms of dyspnea: Secondary | ICD-10-CM | POA: Diagnosis not present

## 2018-05-30 DIAGNOSIS — R590 Localized enlarged lymph nodes: Secondary | ICD-10-CM | POA: Diagnosis not present

## 2018-06-07 ENCOUNTER — Other Ambulatory Visit: Payer: Self-pay | Admitting: Family Medicine

## 2018-06-07 DIAGNOSIS — J45901 Unspecified asthma with (acute) exacerbation: Secondary | ICD-10-CM

## 2018-06-09 DIAGNOSIS — J438 Other emphysema: Secondary | ICD-10-CM | POA: Diagnosis not present

## 2018-06-09 DIAGNOSIS — J449 Chronic obstructive pulmonary disease, unspecified: Secondary | ICD-10-CM | POA: Diagnosis not present

## 2018-06-09 DIAGNOSIS — G4733 Obstructive sleep apnea (adult) (pediatric): Secondary | ICD-10-CM | POA: Diagnosis not present

## 2018-06-18 ENCOUNTER — Ambulatory Visit: Admit: 2018-06-18 | Discharge: 2018-06-18 | Disposition: A | Discharge status: Home / Self Care | Payer: MEDICARE

## 2018-06-18 DIAGNOSIS — R109 Unspecified abdominal pain: Secondary | ICD-10-CM | POA: Diagnosis not present

## 2018-06-18 DIAGNOSIS — R638 Other symptoms and signs concerning food and fluid intake: Secondary | ICD-10-CM | POA: Diagnosis not present

## 2018-06-18 DIAGNOSIS — R1013 Epigastric pain: Secondary | ICD-10-CM | POA: Diagnosis not present

## 2018-06-18 DIAGNOSIS — R630 Anorexia: Secondary | ICD-10-CM | POA: Diagnosis not present

## 2018-06-18 DIAGNOSIS — R110 Nausea: Secondary | ICD-10-CM | POA: Diagnosis not present

## 2018-06-18 DIAGNOSIS — R6883 Chills (without fever): Secondary | ICD-10-CM | POA: Diagnosis not present

## 2018-06-20 DIAGNOSIS — R110 Nausea: Secondary | ICD-10-CM | POA: Diagnosis not present

## 2018-06-20 DIAGNOSIS — J449 Chronic obstructive pulmonary disease, unspecified: Secondary | ICD-10-CM | POA: Diagnosis not present

## 2018-06-20 DIAGNOSIS — K219 Gastro-esophageal reflux disease without esophagitis: Secondary | ICD-10-CM | POA: Diagnosis not present

## 2018-06-20 DIAGNOSIS — R1013 Epigastric pain: Secondary | ICD-10-CM | POA: Diagnosis not present

## 2018-06-24 DIAGNOSIS — R350 Frequency of micturition: Secondary | ICD-10-CM | POA: Diagnosis not present

## 2018-06-24 DIAGNOSIS — R52 Pain, unspecified: Secondary | ICD-10-CM | POA: Diagnosis not present

## 2018-06-28 ENCOUNTER — Other Ambulatory Visit: Payer: Self-pay | Admitting: Specialist

## 2018-06-28 DIAGNOSIS — R590 Localized enlarged lymph nodes: Secondary | ICD-10-CM

## 2018-07-09 DIAGNOSIS — J449 Chronic obstructive pulmonary disease, unspecified: Secondary | ICD-10-CM | POA: Diagnosis not present

## 2018-07-09 DIAGNOSIS — J438 Other emphysema: Secondary | ICD-10-CM | POA: Diagnosis not present

## 2018-07-18 DIAGNOSIS — E119 Type 2 diabetes mellitus without complications: Secondary | ICD-10-CM | POA: Diagnosis not present

## 2018-07-18 DIAGNOSIS — Z7151 Drug abuse counseling and surveillance of drug abuser: Secondary | ICD-10-CM | POA: Diagnosis not present

## 2018-07-18 DIAGNOSIS — Z729 Problem related to lifestyle, unspecified: Secondary | ICD-10-CM | POA: Diagnosis not present

## 2018-07-18 DIAGNOSIS — F1120 Opioid dependence, uncomplicated: Secondary | ICD-10-CM | POA: Diagnosis not present

## 2018-07-18 DIAGNOSIS — M5416 Radiculopathy, lumbar region: Secondary | ICD-10-CM | POA: Diagnosis not present

## 2018-07-18 DIAGNOSIS — F17290 Nicotine dependence, other tobacco product, uncomplicated: Secondary | ICD-10-CM | POA: Diagnosis not present

## 2018-07-18 DIAGNOSIS — G894 Chronic pain syndrome: Secondary | ICD-10-CM | POA: Diagnosis not present

## 2018-07-18 DIAGNOSIS — M5430 Sciatica, unspecified side: Secondary | ICD-10-CM | POA: Diagnosis not present

## 2018-07-18 DIAGNOSIS — Z79899 Other long term (current) drug therapy: Secondary | ICD-10-CM | POA: Diagnosis not present

## 2018-07-19 NOTE — Progress Notes (Signed)
07/20/2018 3:31 PM   Collier Simonne Maffucci [birth year] [id number]  Referring provider: Baxter Hire, MD [place], [place] [address]  No chief complaint on file.   HPI: Patient is a 77 year old African American female with a history of recurrent UTI's who presents today to establish care with a local urologist.    Patient states that she has had three to four urinary tract infections over the last year.  Reviewing her records,  she has had two documented UTI's over the last year. + E.coli resistant to ampicillin, Cipro and Septra and + VRE resistant to ampicillin, Levaquin and vancomycin on 04/08/2018 + E.coli resistant to ampicillin, Cipro, Levaquin, tetracycline and Septra on 04/22/2018   Her symptoms with a urinary tract infection consist of achy, fatigue, frequency, HA's and chills.  She is currently on doxycycline daily.    She has frequency x 5-6 and nocturia x q 1.  This has been going on for a while.  Patient denies any gross hematuria, dysuria or suprapubic/flank pain.  Patient denies any fevers, chills, nausea or vomiting.   She has a history of nephrolithiasis with URS, but she has not had GU trauma.   She is not sexually active.  She is postmenopausal.   She admits to constipation.   She does/does not engage in good perineal hygiene. She does not take tub baths.  She does not have incontinence.    Contrast CT in 03/2018 the adrenal glands are unremarkable and stable. Mild renal cortical scarring changes and small renal cysts.  Stable mild chronic right UPJ obstruction. No ureteral or bladder calculi. No bladder mass.  She is drinking 3 to 4 glassed of water daily.  She does not drink sodas.  She has one cup of coffee.  No juices.  No tea.  No alcohol.  Her PVR is 20 mL.     PMH: Past Medical History:  Diagnosis Date  . CHF (congestive heart failure) ([place])   . COPD (chronic obstructive pulmonary disease) ([place])   . Diabetes mellitus without  complication ([place])   . Diastolic heart failure ([place])   . Hyperlipidemia   . Hypertension     Surgical History: Past Surgical History:  Procedure Laterality Date  . ABDOMINAL HYSTERECTOMY    . SPINE SURGERY      Home Medications:  Allergies as of 07/20/2018      Reactions   Nitrofurantoin Anaphylaxis      Medication List        Accurate as of 07/20/18  3:31 PM. Always use your most recent med list.          ACCU-CHEK AVIVA PLUS test strip Generic drug:  glucose blood 1 each by Other route 2 (two) times daily.   ACCU-CHEK AVIVA PLUS w/Device Kit USE AS DIRECTED   ACCU-CHEK SOFTCLIX LANCETS lancets 1 each by Other route 2 (two) times daily.   albuterol 108 (90 Base) MCG/ACT inhaler Commonly known as:  VENTOLIN HFA Inhale 2 puffs into the lungs every 6 (six) hours as needed for wheezing or shortness of breath.   albuterol (2.5 MG/3ML) 0.083% nebulizer solution Commonly known as:  PROVENTIL Take 3 mLs (2.5 mg total) by nebulization every 6 (six) hours as needed for wheezing or shortness of breath.   aspirin EC 325 MG tablet Take 325 mg by mouth daily.   carvedilol 25 MG tablet Commonly known as:  COREG Take 1 tablet (25 mg total) by mouth 2 (two) times  daily.   clonazePAM 0.5 MG tablet Commonly known as:  KLONOPIN Take 0.5 mg by mouth at bedtime.   conjugated estrogens vaginal cream Commonly known as:  PREMARIN Apply 0.57m (pea-sized amount)  just inside the vaginal introitus with a finger-tip on  Monday, Wednesday and Friday nights.   doxycycline 100 MG capsule Commonly known as:  VIBRAMYCIN Take 100 mg by mouth daily.   estradiol 0.1 MG/GM vaginal cream Commonly known as:  ESTRACE VAGINAL Apply 0.535m(pea-sized amount)  just inside the vaginal introitus with a finger-tip on Monday, Wednesday and Friday nights.   Fluticasone-Salmeterol 250-50 MCG/DOSE Aepb Commonly known as:  ADVAIR DISKUS Inhale 1 puff into the lungs 2 (two) times daily.   furosemide  20 MG tablet Commonly known as:  LASIX Take 2 tablets (40 mg total) by mouth daily.   gabapentin 800 MG tablet Commonly known as:  NEURONTIN Take 1 tablet (800 mg total) by mouth 3 (three) times daily.   irbesartan-hydrochlorothiazide 300-12.5 MG tablet Commonly known as:  AVALIDE Take 1 tablet by mouth daily.   metFORMIN 500 MG tablet Commonly known as:  GLUCOPHAGE TAKE ONE TABLET BY MOUTH EVERY MORNING WITH BREAKFAST   montelukast 10 MG tablet Commonly known as:  SINGULAIR TAKE ONE TABLET BY MOUTH AT BEDTIME   OXYCONTIN 10 mg 12 hr tablet Generic drug:  oxyCODONE Take 10 mg by mouth 2 (two) times daily.   OXYGEN Inhale 2 L into the lungs continuous.   pantoprazole 40 MG tablet Commonly known as:  PROTONIX Take 40 mg by mouth daily.   polyethylene glycol powder powder Commonly known as:  GLYCOLAX/MIRALAX MIX 17 GRAMS OF POWDER IN 4 TO 8 OUNCES OF FLUID AND DRINK BY MOUTH ONCE A DAY   potassium chloride 10 MEQ tablet Commonly known as:  K-DUR Take 1 tablet (10 mEq total) by mouth 2 (two) times daily.   sucralfate 1 g tablet Commonly known as:  CARAFATE Take 1 tablet by mouth 4 (four) times daily -  before meals and at bedtime.   tiotropium 18 MCG inhalation capsule Commonly known as:  SPIRIVA HANDIHALER Place 1 capsule (18 mcg total) into inhaler and inhale daily.   valACYclovir 1000 MG tablet Commonly known as:  VALTREX Take 1 tablet (1,000 mg total) by mouth daily as needed (for fever blisters).   XTAMPZA ER 9 MG C12a Generic drug:  oxyCODONE ER Take 1 capsule by mouth 2 (two) times daily.       Allergies:  Allergies  Allergen Reactions  . Nitrofurantoin Anaphylaxis    Family History: Family History  Problem Relation Age of Onset  . Hypertension Mother   . Colon cancer Father   . Hypertension Paternal Grandfather   . Bladder Cancer Neg Hx   . Kidney cancer Neg Hx     Social History:  reports that she quit smoking about 3 years ago. She  smoked 0.50 packs per day. She has never used smokeless tobacco. She reports that she does not drink alcohol or use drugs.  ROS: UROLOGY Frequent Urination?: Yes Hard to postpone urination?: No Burning/pain with urination?: No Get up at night to urinate?: Yes Leakage of urine?: No Urine stream starts and stops?: No Trouble starting stream?: No Do you have to strain to urinate?: No Blood in urine?: No Urinary tract infection?: Yes Sexually transmitted disease?: No Injury to kidneys or bladder?: No Painful intercourse?: No Weak stream?: No Currently pregnant?: No Vaginal bleeding?: No Last menstrual period?: n  Gastrointestinal Nausea?: No  Vomiting?: No Indigestion/heartburn?: Yes Diarrhea?: No Constipation?: Yes  Constitutional Fever: No Night sweats?: No Weight loss?: Yes Fatigue?: Yes  Skin Skin rash/lesions?: No Itching?: No  Eyes Blurred vision?: Yes Double vision?: No  Ears/Nose/Throat Sore throat?: No Sinus problems?: No  Hematologic/Lymphatic Swollen glands?: No Easy bruising?: No  Cardiovascular Leg swelling?: No Chest pain?: No  Respiratory Cough?: No Shortness of breath?: Yes  Endocrine Excessive thirst?: No  Musculoskeletal Back pain?: No Joint pain?: Yes  Neurological Headaches?: No Dizziness?: No  Psychologic Depression?: Yes Anxiety?: Yes  Physical Exam: BP (!) 86/52 (BP Location: Left Arm, Patient Position: Sitting, Cuff Size: Large)   Pulse 72   Ht 5' 2" (1.575 m)   BMI 39.49 kg/m   Constitutional:  Well nourished. Alert and oriented, No acute distress. HEENT: [place] AT, moist mucus membranes.  Trachea midline, no masses. Cardiovascular: No clubbing, cyanosis, or edema. Respiratory: Normal respiratory effort, no increased work of breathing. GI: Abdomen is soft, non tender, non distended, no abdominal masses. Liver and spleen not palpable.  No hernias appreciated.  Stool sample for occult testing is not indicated.   GU: No  CVA tenderness.  No bladder fullness or masses.  Atrophic external genitalia, normal pubic hair distribution, no lesions.  Normal urethral meatus, no lesions, no prolapse, no discharge.   No urethral masses, tenderness and/or tenderness. No bladder fullness, tenderness or masses. Pale vagina mucosa, poor estrogen effect, no discharge, no lesions, good pelvic support, no cystocele or rectocele noted.  Cervix, uterus and adnexa are surgically absent.  Anus and perineum are without rashes or lesions.    Skin: No rashes, bruises or suspicious lesions. Lymph: No cervical or inguinal adenopathy. Neurologic: Grossly intact, no focal deficits, moving all 4 extremities. Psychiatric: Normal mood and affect.  Laboratory Data: Lab Results  Component Value Date   WBC 11.7 (H) 05/24/2018   HGB 11.7 (L) 05/24/2018   HCT 36.5 05/24/2018   MCV 87.6 05/24/2018   PLT 192 05/24/2018    Lab Results  Component Value Date   CREATININE 0.92 05/25/2018    No results found for: PSA  No results found for: TESTOSTERONE  Lab Results  Component Value Date   HGBA1C 6.6 05/18/2017    Lab Results  Component Value Date   TSH 1.560 06/12/2015       Component Value Date/Time   CHOL 154 12/01/2016 0001   CHOL 160 01/03/2013 0447   HDL 74 12/01/2016 0001   HDL 71 (H) 01/03/2013 0447   CHOLHDL 2.1 12/01/2016 0001   VLDL 18 12/01/2016 0001   VLDL 14 01/03/2013 0447   LDLCALC 62 12/01/2016 0001   LDLCALC 75 01/03/2013 0447    Lab Results  Component Value Date   AST 17 06/03/2017   Lab Results  Component Value Date   ALT 16 06/03/2017   No components found for: ALKALINEPHOPHATASE No components found for: BILIRUBINTOTAL  No results found for: ESTRADIOL  Urinalysis    Component Value Date/Time   COLORURINE YELLOW (A) 04/08/2018 1054   APPEARANCEUR HAZY (A) 04/08/2018 1054   APPEARANCEUR Cloudy 09/19/2014 1157   LABSPEC 1.009 04/08/2018 1054   LABSPEC 1.017 09/19/2014 1157   PHURINE 8.0  04/08/2018 1054   GLUCOSEU NEGATIVE 04/08/2018 1054   GLUCOSEU Negative 09/19/2014 [place] 04/08/2018 1054   BILIRUBINUR NEGATIVE 04/08/2018 1054   BILIRUBINUR neg 12/16/2016 1118   BILIRUBINUR Negative 09/19/2014 [place] 04/08/2018 1054   [place] 04/08/2018 1054  UROBILINOGEN 0.2 12/16/2016 1118   NITRITE NEGATIVE 04/08/2018 1054   LEUKOCYTESUR MODERATE (A) 04/08/2018 1054   LEUKOCYTESUR Negative 09/19/2014 1157    I have reviewed the labs.   Pertinent Imaging: CLINICAL DATA:  Epigastric pain for 2 months.  EXAM: CT ABDOMEN AND PELVIS WITH CONTRAST  TECHNIQUE: Multidetector CT imaging of the abdomen and pelvis was performed using the standard protocol following bolus administration of intravenous contrast.  CONTRAST:  130m ISOVUE-300 IOPAMIDOL (ISOVUE-300) INJECTION 61%  COMPARISON:  CT scan 06/14/2014  FINDINGS: Lower chest: The lung bases are grossly clear. Scarring type changes are noted. There is a 16 mm nodular density at the right lung base which appears to be a pleural lesion. It may be subpleural atelectasis also. I would recommend a dedicated chest CT with contrast for further evaluation and to assess for any other abnormalities. Borderline cardiac enlargement. No pericardial effusion. Distal esophagus is grossly normal.  Hepatobiliary: No focal hepatic lesions or intrahepatic biliary dilatation. Gallbladder is normal. No common bile duct dilatation.  Pancreas: No mass, inflammation or ductal dilatation.  Spleen: Normal size.  No focal lesions.  Adrenals/Urinary Tract: The adrenal glands are unremarkable and stable. Mild renal cortical scarring changes and small renal cysts. Stable mild chronic right UPJ obstruction. No ureteral or bladder calculi. No bladder mass.  Stomach/Bowel: The stomach, duodenum, small bowel and colon are unremarkable. No acute inflammatory process, mass lesions  or obstructive findings. The terminal ileum is normal. The appendix is normal. Scattered sigmoid diverticulosis without findings for acute diverticulitis.  Vascular/Lymphatic: Advanced atherosclerotic calcifications involving the aorta, iliac arteries and branch vessels but no focal aneurysm. Branch vessels are patent. The major venous structures are patent.  No mesenteric or retroperitoneal mass or lymphadenopathy. Small scattered lymph nodes are noted.  Reproductive: Surgically absent.  Other: No ascites or abdominal wall hernia.  Musculoskeletal: Diffuse osteoporosis. No worrisome bone lesions. L1 compression fractures unchanged since prior CT.  IMPRESSION: 1. No acute abdominal/pelvic findings, mass lesions or lymphadenopathy. 2. Advanced atherosclerotic calcifications involving the aorta, iliac arteries and branch vessels. 3. 16 mm nodular density right lung base could be rounded atelectasis or possibly a pleural nodule. I would recommend a followup full chest CT with contrast. 4. Renal scarring changes and small renal cysts but no worrisome renal lesions. Chronic mild right UPJ obstruction.   Electronically Signed   By: PMarijo SanesM.D.   On: 04/20/2018 09:15  Results for HLATALIA, ETZLER(MRN [id number] as of 07/20/2018 15:33  Ref. Range 07/20/2018 15:10  Scan Result Unknown 20    I have independently reviewed the films.    Assessment & Plan:    1. History of recurrent UTI's Criteria for recurrent UTI has been met with 2 or more infections in 6 months or 3 or greater infections in one year  Patient is instructed to increase their water intake until the urine is pale yellow or clear (10 to 12 cups daily)  Patient is instructed to take probiotics (yogurt, oral pills or vaginal suppositories), take cranberry pills or drink the juice and Vitamin C 1,000 mg daily to acidify the urine  Avoid soaking in tubs and wipe front to back after urinating   Discontinue the doxycycline and start the estrogen cream  2. Vaginal atrophy I explained to the patient that when women go through menopause and her estrogen levels are severely diminished, the normal vaginal flora will change.  This is due to an increase of the vaginal canal's pH. Because of this,  the vaginal canal may be colonized by bacteria from the rectum instead of the protective lactobacillus.  This, accompanied by the loss of the mucus barrier with vaginal atrophy, is a cause of recurrent urinary tract infections. In some studies, the use of vaginal estrogen cream has been demonstrated to reduce  recurrent urinary tract infections to one a year.  Patient was given a sample of vaginal estrogen cream (Premarin vaginal cream) and instructed to apply 0.68m (pea-sized amount)  just inside the vaginal introitus with a finger-tip on Monday, Wednesday and Friday nights.  I explained to the patient that vaginally administered estrogen, which causes only a slight increase in the blood estrogen levels, have fewer contraindications and adverse systemic effects that oral HT. I have also given prescriptions for the Estrace cream and Premarin cream, so that the patient may carry them to the pharmacy to see which one of the branded creams would be most economical for her.  If she finds both medications cost prohibitive, she is instructed to call the office.  We can then call in a compounded vaginal estrogen cream for the patient that may be more affordable.   She will follow up in three months for an exam.    3. UPJ obstruction Mild - creatinine 0.92 Follow up RUS in 6 months                                            Return in about 3 months (around 10/20/2018) for exam.  These notes generated with voice recognition software. I apologize for typographical errors.  SZara Council PA-C  [facility] [address] [place]oy RiverBMonmouth [place] [phone]

## 2018-07-20 ENCOUNTER — Ambulatory Visit: Payer: Medicare HMO | Admitting: Urology

## 2018-07-20 ENCOUNTER — Encounter: Payer: Self-pay | Admitting: Urology

## 2018-07-20 VITALS — BP 86/52 | HR 72 | Ht 62.0 in

## 2018-07-20 DIAGNOSIS — J449 Chronic obstructive pulmonary disease, unspecified: Secondary | ICD-10-CM | POA: Diagnosis not present

## 2018-07-20 DIAGNOSIS — N135 Crossing vessel and stricture of ureter without hydronephrosis: Secondary | ICD-10-CM

## 2018-07-20 DIAGNOSIS — N952 Postmenopausal atrophic vaginitis: Secondary | ICD-10-CM

## 2018-07-20 DIAGNOSIS — Z87440 Personal history of urinary (tract) infections: Secondary | ICD-10-CM | POA: Diagnosis not present

## 2018-07-20 LAB — BLADDER SCAN AMB NON-IMAGING: Scan Result: 20

## 2018-07-20 MED ORDER — ESTROGENS, CONJUGATED 0.625 MG/GM VA CREA: TOPICAL_CREAM | VAGINAL | 12 refills | Status: DC

## 2018-07-20 MED ORDER — ESTRADIOL 0.1 MG/GM VA CREA: TOPICAL_CREAM | VAGINAL | 12 refills | Status: DC

## 2018-07-20 NOTE — Patient Instructions (Signed)
                                             Urinary Tract Infection Prevention Patient Education Stay Hydrated: Urinary tract infections (UTIs) are less likely to occur in someone who is drinking enough water to promote regular urination, so it is very important to stay hydrated in order to help flush out bacteria from the urinary tract. Respond to "Nature's Call": It is always a good idea to urinate as soon as you feel the need. While "holding it in" does not directly cause an infection, it can cause overdistension that can damage the lining of the bladder, making it more vulnerable to bacteria. Remove Tampons Before Going: Remember to always take out tampons before urinating, and change tampons often.  Practice Proper Bathroom Hygiene: To keep bacteria near the urethral opening to a minimum, it is important to practice proper wiping techniques (i.e. front to back wiping) to help prevent rectal bacteria from entering the uretro-genital area. It can also be helpful to take showers and avoid soaking in the bathtub.  Take a Vitamin C Supplement: About 1,000 milligrams of vitamin C taken daily can help inhibit the growth of some bacteria by acidifying the urine. Maintain Control with Cranberries: Cranberries contain hippuronic acid, which is a natural antiseptic that may help prevent the adherence of bacteria to the bladder lining. Drinking 100% pure cranberry juice or taking over the counter cranberry supplements twice daily may help to prevent an infection. However, it is important to note that cranberry juices/supplements are not helpful once a urinary tract infection (UTI) is present. Strengthen Your Core: Often, a lazy bladder (unable to empty urine properly) occurs due to lower back problem, so consider doing exercises to help strengthen your back, pelvic floor, and stomach muscles.  Pay Attention to Your Urine: Your urine can change color for a variety of reasons, including from the medications you  take, so pay close attention to it to monitor your overall health. One key thing to note is that if your urine is typically a darker yellow, your body is dehydrated, so you need to step up your water intake.    I have given you two prescriptions for a vaginal estrogen cream.  Estrace and Premarin.  Please take these to your pharmacy and see which one your insurance covers.  If both are too expensive, please call the office at [phone] for an alternative.  You are given a sample of vaginal estrogen cream Premarin and instructed to apply 0.5mg  (pea-sized amount)  just inside the vaginal introitus with a finger-tip on Monday, Wednesday and Friday nights,

## 2018-07-25 DIAGNOSIS — R062 Wheezing: Secondary | ICD-10-CM | POA: Diagnosis not present

## 2018-07-25 DIAGNOSIS — J45909 Unspecified asthma, uncomplicated: Secondary | ICD-10-CM | POA: Diagnosis not present

## 2018-07-25 DIAGNOSIS — R0602 Shortness of breath: Secondary | ICD-10-CM | POA: Diagnosis not present

## 2018-07-25 DIAGNOSIS — J449 Chronic obstructive pulmonary disease, unspecified: Secondary | ICD-10-CM | POA: Diagnosis not present

## 2018-07-29 DIAGNOSIS — I272 Pulmonary hypertension, unspecified: Secondary | ICD-10-CM | POA: Diagnosis not present

## 2018-07-29 DIAGNOSIS — J432 Centrilobular emphysema: Secondary | ICD-10-CM | POA: Diagnosis not present

## 2018-07-29 DIAGNOSIS — K219 Gastro-esophageal reflux disease without esophagitis: Secondary | ICD-10-CM | POA: Diagnosis not present

## 2018-07-29 DIAGNOSIS — E7800 Pure hypercholesterolemia, unspecified: Secondary | ICD-10-CM | POA: Diagnosis not present

## 2018-07-29 DIAGNOSIS — I739 Peripheral vascular disease, unspecified: Secondary | ICD-10-CM | POA: Diagnosis not present

## 2018-07-29 DIAGNOSIS — J9611 Chronic respiratory failure with hypoxia: Secondary | ICD-10-CM | POA: Diagnosis not present

## 2018-07-29 DIAGNOSIS — E119 Type 2 diabetes mellitus without complications: Secondary | ICD-10-CM | POA: Diagnosis not present

## 2018-07-29 DIAGNOSIS — I10 Essential (primary) hypertension: Secondary | ICD-10-CM | POA: Diagnosis not present

## 2018-07-29 DIAGNOSIS — G4733 Obstructive sleep apnea (adult) (pediatric): Secondary | ICD-10-CM | POA: Diagnosis not present

## 2018-08-04 DIAGNOSIS — R110 Nausea: Secondary | ICD-10-CM | POA: Diagnosis not present

## 2018-08-04 DIAGNOSIS — K5900 Constipation, unspecified: Secondary | ICD-10-CM | POA: Diagnosis not present

## 2018-08-04 DIAGNOSIS — R1013 Epigastric pain: Secondary | ICD-10-CM | POA: Diagnosis not present

## 2018-08-09 DIAGNOSIS — J449 Chronic obstructive pulmonary disease, unspecified: Secondary | ICD-10-CM | POA: Diagnosis not present

## 2018-08-09 DIAGNOSIS — J438 Other emphysema: Secondary | ICD-10-CM | POA: Diagnosis not present

## 2018-08-15 DIAGNOSIS — R809 Proteinuria, unspecified: Secondary | ICD-10-CM | POA: Diagnosis not present

## 2018-08-15 DIAGNOSIS — I129 Hypertensive chronic kidney disease with stage 1 through stage 4 chronic kidney disease, or unspecified chronic kidney disease: Secondary | ICD-10-CM | POA: Diagnosis not present

## 2018-08-15 DIAGNOSIS — E876 Hypokalemia: Secondary | ICD-10-CM | POA: Diagnosis not present

## 2018-08-15 DIAGNOSIS — N183 Chronic kidney disease, stage 3 (moderate): Secondary | ICD-10-CM | POA: Diagnosis not present

## 2018-08-15 DIAGNOSIS — E1122 Type 2 diabetes mellitus with diabetic chronic kidney disease: Secondary | ICD-10-CM | POA: Diagnosis not present

## 2018-08-20 DIAGNOSIS — J449 Chronic obstructive pulmonary disease, unspecified: Secondary | ICD-10-CM | POA: Diagnosis not present

## 2018-08-25 NOTE — Telephone Encounter (Signed)
Open in error

## 2018-09-05 DIAGNOSIS — R112 Nausea with vomiting, unspecified: Secondary | ICD-10-CM | POA: Diagnosis not present

## 2018-09-05 DIAGNOSIS — R1013 Epigastric pain: Secondary | ICD-10-CM | POA: Diagnosis not present

## 2018-09-09 DIAGNOSIS — J438 Other emphysema: Secondary | ICD-10-CM | POA: Diagnosis not present

## 2018-09-09 DIAGNOSIS — J449 Chronic obstructive pulmonary disease, unspecified: Secondary | ICD-10-CM | POA: Diagnosis not present

## 2018-09-12 DIAGNOSIS — E119 Type 2 diabetes mellitus without complications: Secondary | ICD-10-CM | POA: Diagnosis not present

## 2018-09-12 DIAGNOSIS — M5416 Radiculopathy, lumbar region: Secondary | ICD-10-CM | POA: Diagnosis not present

## 2018-09-12 DIAGNOSIS — R112 Nausea with vomiting, unspecified: Secondary | ICD-10-CM | POA: Diagnosis not present

## 2018-09-12 DIAGNOSIS — F17290 Nicotine dependence, other tobacco product, uncomplicated: Secondary | ICD-10-CM | POA: Diagnosis not present

## 2018-09-12 DIAGNOSIS — M5430 Sciatica, unspecified side: Secondary | ICD-10-CM | POA: Diagnosis not present

## 2018-09-12 DIAGNOSIS — R1013 Epigastric pain: Secondary | ICD-10-CM | POA: Diagnosis not present

## 2018-09-12 DIAGNOSIS — Z729 Problem related to lifestyle, unspecified: Secondary | ICD-10-CM | POA: Diagnosis not present

## 2018-09-12 DIAGNOSIS — Z79899 Other long term (current) drug therapy: Secondary | ICD-10-CM | POA: Diagnosis not present

## 2018-09-12 DIAGNOSIS — F1120 Opioid dependence, uncomplicated: Secondary | ICD-10-CM | POA: Diagnosis not present

## 2018-09-12 DIAGNOSIS — G894 Chronic pain syndrome: Secondary | ICD-10-CM | POA: Diagnosis not present

## 2018-09-12 DIAGNOSIS — Z7151 Drug abuse counseling and surveillance of drug abuser: Secondary | ICD-10-CM | POA: Diagnosis not present

## 2018-09-20 DIAGNOSIS — J449 Chronic obstructive pulmonary disease, unspecified: Secondary | ICD-10-CM | POA: Diagnosis not present

## 2018-09-25 ENCOUNTER — Other Ambulatory Visit: Payer: Self-pay

## 2018-09-25 ENCOUNTER — Emergency Department: Payer: Medicare HMO

## 2018-09-25 ENCOUNTER — Encounter: Payer: Self-pay | Admitting: Emergency Medicine

## 2018-09-25 ENCOUNTER — Inpatient Hospital Stay
Admission: EM | Admit: 2018-09-25 | Discharge: 2018-09-28 | DRG: 291 | Disposition: A | Discharge status: Home Health | Payer: Medicare HMO | Attending: Internal Medicine | Admitting: Internal Medicine

## 2018-09-25 DIAGNOSIS — Z79890 Hormone replacement therapy (postmenopausal): Secondary | ICD-10-CM

## 2018-09-25 DIAGNOSIS — N289 Disorder of kidney and ureter, unspecified: Secondary | ICD-10-CM | POA: Diagnosis present

## 2018-09-25 DIAGNOSIS — Z7984 Long term (current) use of oral hypoglycemic drugs: Secondary | ICD-10-CM

## 2018-09-25 DIAGNOSIS — I959 Hypotension, unspecified: Secondary | ICD-10-CM | POA: Diagnosis not present

## 2018-09-25 DIAGNOSIS — I5020 Unspecified systolic (congestive) heart failure: Secondary | ICD-10-CM | POA: Diagnosis not present

## 2018-09-25 DIAGNOSIS — J9621 Acute and chronic respiratory failure with hypoxia: Secondary | ICD-10-CM | POA: Diagnosis present

## 2018-09-25 DIAGNOSIS — I10 Essential (primary) hypertension: Secondary | ICD-10-CM | POA: Diagnosis not present

## 2018-09-25 DIAGNOSIS — K5900 Constipation, unspecified: Secondary | ICD-10-CM | POA: Diagnosis not present

## 2018-09-25 DIAGNOSIS — D649 Anemia, unspecified: Secondary | ICD-10-CM | POA: Diagnosis not present

## 2018-09-25 DIAGNOSIS — E119 Type 2 diabetes mellitus without complications: Secondary | ICD-10-CM | POA: Diagnosis present

## 2018-09-25 DIAGNOSIS — Z7982 Long term (current) use of aspirin: Secondary | ICD-10-CM | POA: Diagnosis not present

## 2018-09-25 DIAGNOSIS — E785 Hyperlipidemia, unspecified: Secondary | ICD-10-CM | POA: Diagnosis present

## 2018-09-25 DIAGNOSIS — R0602 Shortness of breath: Secondary | ICD-10-CM | POA: Diagnosis not present

## 2018-09-25 DIAGNOSIS — R410 Disorientation, unspecified: Secondary | ICD-10-CM | POA: Diagnosis not present

## 2018-09-25 DIAGNOSIS — Z8249 Family history of ischemic heart disease and other diseases of the circulatory system: Secondary | ICD-10-CM

## 2018-09-25 DIAGNOSIS — Z9981 Dependence on supplemental oxygen: Secondary | ICD-10-CM | POA: Diagnosis not present

## 2018-09-25 DIAGNOSIS — G8929 Other chronic pain: Secondary | ICD-10-CM | POA: Diagnosis not present

## 2018-09-25 DIAGNOSIS — S0990XA Unspecified injury of head, initial encounter: Secondary | ICD-10-CM | POA: Diagnosis not present

## 2018-09-25 DIAGNOSIS — Z7951 Long term (current) use of inhaled steroids: Secondary | ICD-10-CM

## 2018-09-25 DIAGNOSIS — R4182 Altered mental status, unspecified: Secondary | ICD-10-CM | POA: Diagnosis not present

## 2018-09-25 DIAGNOSIS — Z800 Family history of malignant neoplasm of digestive organs: Secondary | ICD-10-CM

## 2018-09-25 DIAGNOSIS — I509 Heart failure, unspecified: Secondary | ICD-10-CM | POA: Diagnosis not present

## 2018-09-25 DIAGNOSIS — J449 Chronic obstructive pulmonary disease, unspecified: Secondary | ICD-10-CM | POA: Diagnosis not present

## 2018-09-25 DIAGNOSIS — Z87891 Personal history of nicotine dependence: Secondary | ICD-10-CM

## 2018-09-25 DIAGNOSIS — I110 Hypertensive heart disease with heart failure: Secondary | ICD-10-CM | POA: Diagnosis not present

## 2018-09-25 DIAGNOSIS — I5033 Acute on chronic diastolic (congestive) heart failure: Secondary | ICD-10-CM | POA: Diagnosis present

## 2018-09-25 LAB — URINALYSIS, COMPLETE (UACMP) WITH MICROSCOPIC
Bilirubin Urine: NEGATIVE
Glucose, UA: NEGATIVE mg/dL
Hgb urine dipstick: NEGATIVE
Ketones, ur: NEGATIVE mg/dL
Leukocytes, UA: NEGATIVE
Nitrite: NEGATIVE
Protein, ur: NEGATIVE mg/dL
Specific Gravity, Urine: 1.006 (ref 1.005–1.030)
pH: 7 (ref 5.0–8.0)

## 2018-09-25 LAB — COMPREHENSIVE METABOLIC PANEL
ALBUMIN: 4 g/dL (ref 3.5–5.0)
ALK PHOS: 62 U/L (ref 38–126)
ALT: 40 U/L (ref 0–44)
ANION GAP: 9 (ref 5–15)
AST: 27 U/L (ref 15–41)
BUN: 34 mg/dL — ABNORMAL HIGH (ref 8–23)
CALCIUM: 8.8 mg/dL — AB (ref 8.9–10.3)
CO2: 33 mmol/L — AB (ref 22–32)
Chloride: 97 mmol/L — ABNORMAL LOW (ref 98–111)
Creatinine, Ser: 1.05 mg/dL — ABNORMAL HIGH (ref 0.44–1.00)
GFR calc Af Amer: 58 mL/min — ABNORMAL LOW (ref >60)
GFR calc non Af Amer: 50 mL/min — ABNORMAL LOW (ref >60)
GLUCOSE: 220 mg/dL — AB (ref 70–99)
POTASSIUM: 4.7 mmol/L (ref 3.5–5.1)
Sodium: 139 mmol/L (ref 135–145)
Total Bilirubin: 1.1 mg/dL (ref 0.3–1.2)
Total Protein: 7 g/dL (ref 6.5–8.1)

## 2018-09-25 LAB — CBC
HEMATOCRIT: 34.7 % — AB (ref 35.0–47.0)
HEMOGLOBIN: 11.2 g/dL — AB (ref 12.0–16.0)
MCH: 28 pg (ref 26.0–34.0)
MCHC: 32.4 g/dL (ref 32.0–36.0)
MCV: 86.4 fL (ref 80.0–100.0)
Platelets: 174 10*3/uL (ref 150–440)
RBC: 4.02 MIL/uL (ref 3.80–5.20)
RDW: 14.4 % (ref 11.5–14.5)
WBC: 9.3 10*3/uL (ref 3.6–11.0)

## 2018-09-25 LAB — BRAIN NATRIURETIC PEPTIDE: B NATRIURETIC PEPTIDE 5: 531 pg/mL — AB (ref 0.0–100.0)

## 2018-09-25 LAB — LIPASE, BLOOD: Lipase: 35 U/L (ref 11–51)

## 2018-09-25 LAB — TROPONIN I: TROPONIN I: 0.03 ng/mL — AB (ref <0.03)

## 2018-09-25 MED ORDER — GABAPENTIN 400 MG PO CAPS
800.0000 mg | ORAL_CAPSULE | Freq: Three times a day (TID) | ORAL | Status: DC
  Administered 2018-09-25 – 2018-09-28 (×8): 800 mg via ORAL
  Filled 2018-09-25 (×8): qty 2

## 2018-09-25 MED ORDER — ALBUTEROL SULFATE (2.5 MG/3ML) 0.083% IN NEBU
5.0000 mg | INHALATION_SOLUTION | Freq: Once | RESPIRATORY_TRACT | Status: AC
  Administered 2018-09-25: 5 mg via RESPIRATORY_TRACT
  Filled 2018-09-25: qty 6

## 2018-09-25 MED ORDER — ONDANSETRON HCL 4 MG/2ML IJ SOLN: 4.0000 mg | Freq: Four times a day (QID) | INTRAMUSCULAR | Status: DC | PRN

## 2018-09-25 MED ORDER — FUROSEMIDE 10 MG/ML IJ SOLN
20.0000 mg | Freq: Two times a day (BID) | INTRAMUSCULAR | Status: DC
  Administered 2018-09-26 – 2018-09-27 (×3): 20 mg via INTRAVENOUS
  Filled 2018-09-25 (×3): qty 2

## 2018-09-25 MED ORDER — FUROSEMIDE 10 MG/ML IJ SOLN
20.0000 mg | Freq: Once | INTRAMUSCULAR | Status: AC
  Administered 2018-09-25: 20 mg via INTRAVENOUS
  Filled 2018-09-25: qty 4

## 2018-09-25 MED ORDER — PANTOPRAZOLE SODIUM 40 MG PO TBEC
40.0000 mg | DELAYED_RELEASE_TABLET | Freq: Every day | ORAL | Status: DC
  Administered 2018-09-26 – 2018-09-28 (×3): 40 mg via ORAL
  Filled 2018-09-25 (×3): qty 1

## 2018-09-25 MED ORDER — ACETAMINOPHEN 650 MG RE SUPP: 650.0000 mg | Freq: Four times a day (QID) | RECTAL | Status: DC | PRN

## 2018-09-25 MED ORDER — MONTELUKAST SODIUM 10 MG PO TABS
10.0000 mg | ORAL_TABLET | Freq: Every day | ORAL | Status: DC
  Administered 2018-09-25 – 2018-09-27 (×3): 10 mg via ORAL
  Filled 2018-09-25 (×3): qty 1

## 2018-09-25 MED ORDER — CLONAZEPAM 0.5 MG PO TABS
0.5000 mg | ORAL_TABLET | Freq: Every day | ORAL | Status: DC
  Administered 2018-09-27: 0.5 mg via ORAL
  Filled 2018-09-25 (×2): qty 1

## 2018-09-25 MED ORDER — INSULIN ASPART 100 UNIT/ML ~~LOC~~ SOLN: 0.0000 [IU] | Freq: Every day | SUBCUTANEOUS | Status: DC

## 2018-09-25 MED ORDER — ATORVASTATIN CALCIUM 20 MG PO TABS
40.0000 mg | ORAL_TABLET | Freq: Every day | ORAL | Status: DC
  Administered 2018-09-25 – 2018-09-26 (×2): 40 mg via ORAL
  Filled 2018-09-25 (×2): qty 2

## 2018-09-25 MED ORDER — ONDANSETRON HCL 4 MG PO TABS: 4.0000 mg | ORAL_TABLET | Freq: Four times a day (QID) | ORAL | Status: DC | PRN

## 2018-09-25 MED ORDER — ALBUTEROL SULFATE (2.5 MG/3ML) 0.083% IN NEBU: 2.5000 mg | INHALATION_SOLUTION | Freq: Four times a day (QID) | RESPIRATORY_TRACT | Status: DC | PRN

## 2018-09-25 MED ORDER — POLYETHYLENE GLYCOL 3350 17 G PO PACK
17.0000 g | PACK | Freq: Every day | ORAL | Status: DC | PRN
  Administered 2018-09-25 – 2018-09-26 (×2): 17 g via ORAL
  Filled 2018-09-25 (×2): qty 1

## 2018-09-25 MED ORDER — OXYCODONE ER 9 MG PO C12A: 1.0000 | EXTENDED_RELEASE_CAPSULE | Freq: Two times a day (BID) | ORAL | Status: DC

## 2018-09-25 MED ORDER — ACETAMINOPHEN 325 MG PO TABS
650.0000 mg | ORAL_TABLET | Freq: Four times a day (QID) | ORAL | Status: DC | PRN
  Administered 2018-09-27: 650 mg via ORAL
  Filled 2018-09-25: qty 2

## 2018-09-25 MED ORDER — MOMETASONE FURO-FORMOTEROL FUM 200-5 MCG/ACT IN AERO
2.0000 | INHALATION_SPRAY | Freq: Two times a day (BID) | RESPIRATORY_TRACT | Status: DC
  Administered 2018-09-25 – 2018-09-28 (×6): 2 via RESPIRATORY_TRACT
  Filled 2018-09-25: qty 8.8

## 2018-09-25 MED ORDER — CARVEDILOL 25 MG PO TABS
25.0000 mg | ORAL_TABLET | Freq: Two times a day (BID) | ORAL | Status: DC
  Administered 2018-09-25 – 2018-09-27 (×4): 25 mg via ORAL
  Filled 2018-09-25 (×4): qty 1

## 2018-09-25 MED ORDER — ACETAMINOPHEN 500 MG PO TABS
1000.0000 mg | ORAL_TABLET | Freq: Once | ORAL | Status: AC
  Administered 2018-09-25: 1000 mg via ORAL
  Filled 2018-09-25: qty 2

## 2018-09-25 MED ORDER — ALBUTEROL SULFATE HFA 108 (90 BASE) MCG/ACT IN AERS: 2.0000 | INHALATION_SPRAY | Freq: Four times a day (QID) | RESPIRATORY_TRACT | Status: DC | PRN

## 2018-09-25 MED ORDER — SUCRALFATE 1 G PO TABS
1.0000 g | ORAL_TABLET | Freq: Three times a day (TID) | ORAL | Status: DC
  Administered 2018-09-26 (×2): 1 g via ORAL
  Filled 2018-09-25 (×2): qty 1

## 2018-09-25 MED ORDER — ASPIRIN EC 325 MG PO TBEC
325.0000 mg | DELAYED_RELEASE_TABLET | Freq: Every day | ORAL | Status: DC
  Administered 2018-09-26 – 2018-09-28 (×3): 325 mg via ORAL
  Filled 2018-09-25 (×3): qty 1

## 2018-09-25 MED ORDER — INSULIN ASPART 100 UNIT/ML ~~LOC~~ SOLN
0.0000 [IU] | Freq: Three times a day (TID) | SUBCUTANEOUS | Status: DC
  Administered 2018-09-26: 1 [IU] via SUBCUTANEOUS
  Administered 2018-09-27: 2 [IU] via SUBCUTANEOUS
  Administered 2018-09-27: 1 [IU] via SUBCUTANEOUS
  Administered 2018-09-28: 3 [IU] via SUBCUTANEOUS
  Filled 2018-09-25 (×4): qty 1

## 2018-09-25 MED ORDER — ENOXAPARIN SODIUM 40 MG/0.4ML ~~LOC~~ SOLN
40.0000 mg | SUBCUTANEOUS | Status: DC
  Administered 2018-09-25 – 2018-09-27 (×3): 40 mg via SUBCUTANEOUS
  Filled 2018-09-25 (×3): qty 0.4

## 2018-09-25 MED ORDER — TIOTROPIUM BROMIDE MONOHYDRATE 18 MCG IN CAPS
18.0000 ug | ORAL_CAPSULE | Freq: Every day | RESPIRATORY_TRACT | Status: DC
  Administered 2018-09-26 – 2018-09-28 (×3): 18 ug via RESPIRATORY_TRACT
  Filled 2018-09-25: qty 5

## 2018-09-25 MED ORDER — OXYCODONE HCL ER 10 MG PO T12A
10.0000 mg | EXTENDED_RELEASE_TABLET | Freq: Two times a day (BID) | ORAL | Status: DC
  Administered 2018-09-26 – 2018-09-28 (×5): 10 mg via ORAL
  Filled 2018-09-25 (×6): qty 1

## 2018-09-25 NOTE — Progress Notes (Signed)
Family Meeting Note  Advance Directive:no  Today a meeting took place with the Patient and daughter.  Patient is able to participate.  The following clinical team members were present during this meeting:MD  The following were discussed:Patient's diagnosis: , Patient's progosis: Unable to determine and Goals for treatment: Full Code  Additional follow-up to be provided: prn  Time spent during discussion:20 minutes  Evette Doffing, MD

## 2018-09-25 NOTE — ED Notes (Signed)
Dr. Leida Lauth made aware of Troponin level.

## 2018-09-25 NOTE — ED Notes (Signed)
Dr. Cinda Quest at bedside.  Pts daughter reports she is still not making sense in her communication at times- asking if she (the daughter) saw the doctor go out and start that car, and asking if that's cornbread on the table.  Neurological assessment done and no deficits noted.

## 2018-09-25 NOTE — ED Notes (Signed)
Went into room to check on pts pain.  Pt stated she had no pain.  Spoke to daughter for a few seconds, then pt said, "Did you give me that Tylenol?  My head hurts."

## 2018-09-25 NOTE — H&P (Addendum)
[facility] [place] NAME: Megan Bolton    MR#:  [id number]  DATE OF BIRTH:  Nov 16, 1941  DATE OF ADMISSION:  09/25/2018  PRIMARY CARE PHYSICIAN: Baxter Hire, MD   REQUESTING/REFERRING PHYSICIAN: Conni Slipper, MD  CHIEF COMPLAINT:   Chief Complaint  Patient presents with  . Shortness of Breath    HISTORY OF PRESENT ILLNESS:  Megan Bolton  is a 77 y.o. female with a known history of HTN, T2DM, HLD, COPD, and chronic diastolic heart failure who presented to the ED with progressively worsening shortness of breath and lower extremity edema over the last three days. She has been taking Lasix 81m daily at home, but states that she is not urinating as much as she used to. She typically uses 2L O2 at home, but required 3L O2 this morning for O2 saturations as low as 80. No changes in her diet. She also feels that her abdomen is swollen. She endorses orthopnea and dry cough. No fevers or chills. No chest pain or sputum production.   She was also noted to have intermittent confusion while in the ED. She endorses a fall a couple of days ago, where she landed on her hands and knees. She is not on any blood thinners.  In the ED, she required 4L O2 by [place] for desaturations to the high 80s. She did have some low BPs to 79/60. Labs were significant for BNP 531. CXR was consistent with CHF. She was given Lasix 236mIV x 1. Hospitalists were called for admission.  PAST MEDICAL HISTORY:   Past Medical History:  Diagnosis Date  . CHF (congestive heart failure) ([facility]  . COPD (chronic obstructive pulmonary disease) ([facility]  . Diabetes mellitus without complication ([facility]  . Diastolic heart failure ([facility]  . Hyperlipidemia   . Hypertension     PAST SURGICAL HISTORY:   Past Surgical History:  Procedure Laterality Date  . ABDOMINAL HYSTERECTOMY    . SPINE SURGERY      SOCIAL HISTORY:   Social History   Tobacco Use  . Smoking status: Former  Smoker    Packs/day: 0.50    Last attempt to quit: 04/28/2015    Years since quitting: 3.4  . Smokeless tobacco: Never Used  Substance Use Topics  . Alcohol use: No    Alcohol/week: 0.0 standard drinks    FAMILY HISTORY:   Family History  Problem Relation Age of Onset  . Hypertension Mother   . Colon cancer Father   . Hypertension Paternal Grandfather   . Bladder Cancer Neg Hx   . Kidney cancer Neg Hx     DRUG ALLERGIES:   Allergies  Allergen Reactions  . Nitrofurantoin Anaphylaxis    REVIEW OF SYSTEMS:   Review of Systems  Constitutional: Negative for chills and fever.  HENT: Negative for congestion and sore throat.   Eyes: Negative for blurred vision and double vision.  Respiratory: Positive for cough and shortness of breath. Negative for sputum production.   Cardiovascular: Positive for orthopnea and leg swelling. Negative for chest pain and palpitations.  Gastrointestinal: Negative for abdominal pain, constipation, nausea and vomiting.  Genitourinary: Negative for dysuria and frequency.  Musculoskeletal: Positive for falls. Negative for back pain and neck pain.  Neurological: Positive for headaches. Negative for dizziness, sensory change and focal weakness.  Psychiatric/Behavioral: Negative for depression. The patient is not nervous/anxious.     MEDICATIONS AT HOME:   Prior to  Admission medications   Medication Sig Start Date End Date Taking? Authorizing Provider  ACCU-CHEK AVIVA PLUS test strip 1 each by Other route 2 (two) times daily. 09/08/17   Karamalegos, Devonne Doughty, DO  ACCU-CHEK SOFTCLIX LANCETS lancets 1 each by Other route 2 (two) times daily. 09/08/17   Karamalegos, Devonne Doughty, DO  albuterol (PROVENTIL) (2.5 MG/3ML) 0.083% nebulizer solution Take 3 mLs (2.5 mg total) by nebulization every 6 (six) hours as needed for wheezing or shortness of breath. 04/08/18   Delman Kitten, MD  albuterol (VENTOLIN HFA) 108 (90 Base) MCG/ACT inhaler Inhale 2 puffs into the  lungs every 6 (six) hours as needed for wheezing or shortness of breath. 05/12/17   Karamalegos, Devonne Doughty, DO  aspirin EC 325 MG tablet Take 325 mg by mouth daily.    [provider]  Blood Glucose Monitoring Suppl (ACCU-CHEK AVIVA PLUS) w/Device KIT USE AS DIRECTED 01/12/17   Arlis Porta., MD  carvedilol (COREG) 25 MG tablet Take 1 tablet (25 mg total) by mouth 2 (two) times daily. 11/24/17   Karamalegos, Devonne Doughty, DO  clonazePAM (KLONOPIN) 0.5 MG tablet Take 0.5 mg by mouth at bedtime.  07/04/15   [provider]  conjugated estrogens (PREMARIN) vaginal cream Apply 0.'5mg'$  (pea-sized amount)  just inside the vaginal introitus with a finger-tip on  Monday, Wednesday and Friday nights. 07/20/18   Zara Council A, PA-C  doxycycline (VIBRAMYCIN) 100 MG capsule Take 100 mg by mouth daily.  08/26/17   [provider]  estradiol (ESTRACE VAGINAL) 0.1 MG/GM vaginal cream Apply 0.'5mg'$  (pea-sized amount)  just inside the vaginal introitus with a finger-tip on Monday, Wednesday and Friday nights. 07/20/18   Zara Council A, PA-C  Fluticasone-Salmeterol (ADVAIR DISKUS) 250-50 MCG/DOSE AEPB Inhale 1 puff into the lungs 2 (two) times daily. 05/04/17   Karamalegos, Devonne Doughty, DO  furosemide (LASIX) 20 MG tablet Take 2 tablets (40 mg total) by mouth daily. Patient taking differently: Take 20 mg by mouth daily. May take 40 mg if needed 08/31/17   Bettey Costa, MD  gabapentin (NEURONTIN) 800 MG tablet Take 1 tablet (800 mg total) by mouth 3 (three) times daily. 12/01/16   Arlis Porta., MD  irbesartan-hydrochlorothiazide (AVALIDE) 300-12.5 MG tablet Take 1 tablet by mouth daily.  08/06/17   [provider]  metFORMIN (GLUCOPHAGE) 500 MG tablet TAKE ONE TABLET BY MOUTH EVERY MORNING WITH BREAKFAST 03/24/17   Arlis Porta., MD  montelukast (SINGULAIR) 10 MG tablet TAKE ONE TABLET BY MOUTH AT BEDTIME 03/24/17   Arlis Porta., MD  OXYCONTIN 10 MG 12 hr tablet  Take 10 mg by mouth 2 (two) times daily.    [provider]  OXYGEN Inhale 2 L into the lungs continuous.    [provider]  pantoprazole (PROTONIX) 40 MG tablet Take 40 mg by mouth daily. 12/08/17 12/08/18  [provider]  polyethylene glycol powder (GLYCOLAX/MIRALAX) powder MIX 17 GRAMS OF POWDER IN 4 TO 8 OUNCES OF FLUID AND DRINK BY MOUTH ONCE A DAY 05/10/18   [provider]  potassium chloride (K-DUR) 10 MEQ tablet Take 1 tablet (10 mEq total) by mouth 2 (two) times daily. 10/12/17   Karamalegos, Devonne Doughty, DO  sucralfate (CARAFATE) 1 g tablet Take 1 tablet by mouth 4 (four) times daily -  before meals and at bedtime. 02/15/18 02/15/19  [provider]  tiotropium (SPIRIVA HANDIHALER) 18 MCG inhalation capsule Place 1 capsule (18 mcg total) into  inhaler and inhale daily. 08/27/17   Karamalegos, Devonne Doughty, DO  valACYclovir (VALTREX) 1000 MG tablet Take 1 tablet (1,000 mg total) by mouth daily as needed (for fever blisters). 04/19/16   Gladstone Lighter, MD  XTAMPZA ER 9 MG C12A Take 1 capsule by mouth 2 (two) times daily. 04/28/18   [provider]      VITAL SIGNS:  Blood pressure (!) 79/60, pulse 76, temperature 97.6 F (36.4 C), temperature source Oral, resp. rate 16, height '5\' 5"'$  (1.651 m), weight 100.2 kg, SpO2 95 %.  PHYSICAL EXAMINATION:  Physical Exam  GENERAL:  77 y.o.-year-old patient lying in the bed with no acute distress.  EYES: Pupils equal, round, reactive to light and accommodation. No scleral icterus. Extraocular muscles intact.  HEENT: Head atraumatic, normocephalic. Oropharynx and nasopharynx clear. Moist mucous membranes. NECK:  Supple, no jugular venous distention. No thyroid enlargement, no tenderness.  LUNGS: [place] in place, +bibasilar crackles, no wheezing, normal work of breathing, no accessory muscle use CARDIOVASCULAR: RRR, S1, S2 normal. No murmurs, rubs, or gallops.  ABDOMEN: Soft, nontender, nondistended.  Bowel sounds present. No organomegaly or mass.  EXTREMITIES: 2+ edema to the mid shin bilaterally, no cyanosis or clubbing.  NEUROLOGIC: Cranial nerves II through XII are intact. Muscle strength 5/5 in all extremities. Sensation intact. Gait not checked.  PSYCHIATRIC: The patient is alert and oriented x 3.  SKIN: No obvious rash, lesion, or ulcer.   LABORATORY PANEL:   CBC Recent Labs  Lab 09/25/18 1220  WBC 9.3  HGB 11.2*  HCT 34.7*  PLT 174   ------------------------------------------------------------------------------------------------------------------  Chemistries  Recent Labs  Lab 09/25/18 1220  NA 139  K 4.7  CL 97*  CO2 33*  GLUCOSE 220*  BUN 34*  CREATININE 1.05*  CALCIUM 8.8*  AST 27  ALT 40  ALKPHOS 62  BILITOT 1.1   ------------------------------------------------------------------------------------------------------------------  Cardiac Enzymes No results for input(s): TROPONINI in the last 168 hours. ------------------------------------------------------------------------------------------------------------------  RADIOLOGY:  Dg Chest 2 View  Result Date: 09/25/2018 CLINICAL DATA:  Difficulty breathing. Increased over the past 2 days. EXAM: CHEST - 2 VIEW COMPARISON:  CT chest 05/24/2018 FINDINGS: There is bilateral interstitial thickening more prominent at the lung bases. There is no pleural effusion or pneumothorax. There is stable cardiomegaly. There is thoracic aortic atherosclerosis. The osseous structures are unremarkable. IMPRESSION: 1. Findings most consistent with CHF. Electronically Signed   By: Kathreen Devoid   On: 09/25/2018 13:42      IMPRESSION AND PLAN:   Acute on chronic hypoxic respiratory failure secondary to acute exacerbation of chronic diastolic heart failure- BNP elevated and CXR findings consistent with CHF. Last ECHO 04/2018 with normal EF and G1DD. On 2L O2 at home. Follows with Dr. Clayborn Bigness as an outpatient. - Lasix '20mg'$  IV  bid - cardiology consult - continue coreg - cardiac monitoring - daily weights - strict I/O  Intermittent AMS- resolved on my exam. May have been related to low BP and hypoxia. No focal deficits to suggest stroke. - check CT head, as patient had recent fall - if she continues to have AMS, could consider ABG, additional head imaging, stopping meds that may be contributing, neuro consult  COPD- stable, no wheezing to suggest acute exacerbation. - continue home inhalers  Hypertension- had 1 low BP to 79/60 in the ED, but otherwise has been hypertensive. - hold irbesartan-hctz due to [name] and low BP, can likely restart tomorrow  Type 2 diabetes- on metformin at home. Glucose 220 in  the ED. - sensitive SSI - check a1c  Hyperlipidemia- not on a statin. - start lipitor 31m daily - check lipid panel  Normocytic anemia- Hgb at baseline - check anemia panel  Recent H. Pylori infection- just finished treatment yesterday. Epigastric abdominal pain has resolved - monitor  Chronic pain- stable - continue home oxycontin, xtampza, and gabapentin  All the records are reviewed and case discussed with ED provider. Management plans discussed with the patient, family and they are in agreement.  CODE STATUS: FULL  TOTAL TIME TAKING CARE OF THIS PATIENT: 45 minutes.    KBerna SpareMayo M.D on 09/25/2018 at 4:17 PM  Between 7am to 6pm - Pager - [phone] After 6pm go to [email or web address] - pProofreader [facility]  Office  [phone] CC: Primary care physician; JBaxter Hire MD   Note: This dictation was prepared with Dragon dictation along with smaller phrase technology. Any transcriptional errors that result from this process are unintentional.

## 2018-09-25 NOTE — ED Provider Notes (Signed)
[facility] Emergency Department Provider Note   ____________________________________________   First MD Initiated Contact with Patient 09/25/18 1234     (approximate)  I have reviewed the triage vital signs and the nursing notes.   HISTORY  Chief Complaint Shortness of Breath  HPI Megan Bolton is a 77 y.o. female patient complains of worsening shortness of breath since Friday.  Has gone from 2 L up to 4 L.  She was 87% on arrival here on 3 L.  She has no fever she complains of a wet cough productive of clear phlegm.  She denies any chest pain.  She told the nurse that she had some pain in her upper abdomen with deep breathing though.  Did not tell me that.  After she been in the ER for a couple of hours getting her Lasix she began to talk about the doctor going out to start a car and a bunch of other things which had not happen asking about cornbread sitting on the side of the counter seems to be having a little delirium possibly.   Past Medical History:  Diagnosis Date  . CHF (congestive heart failure) ([place])   . COPD (chronic obstructive pulmonary disease) ([place])   . Diabetes mellitus without complication ([place])   . Diastolic heart failure ([place])   . Hyperlipidemia   . Hypertension     Patient Active Problem List   Diagnosis Date Noted  . Acute hypoxemic respiratory failure ([place]) 05/25/2018  . CHF (congestive heart failure) ([place]) 12/19/2017  . Restless leg syndrome 09/07/2017  . CHF exacerbation ([place]) 08/30/2017  . Adjustment disorder with mixed anxiety and depressed mood 08/13/2017  . Peripheral neuropathy 07/14/2017  . Malignant hypertension 01/29/2017  . Chronic respiratory failure with hypoxia, on home O2 therapy ([place]) 12/24/2016  . Incomplete emptying of bladder 07/19/2016  . Bacteriuria 07/17/2016  . Infection due to ESBL-producing Escherichia coli 04/21/2016  . Frequent UTI 04/21/2016  . OSA on CPAP 04/18/2016  . Bladder infection,  chronic 04/17/2016  . Calculus of kidney 04/17/2016  . Community acquired pneumonia 02/28/2016  . [name] (chronic kidney disease) 07/12/2015  . Controlled type 2 diabetes mellitus with diabetic neuropathy, without long-term current use of insulin ([place]) 06/11/2015  . Arthritis, degenerative 06/11/2015  . Hypercholesteremia 06/11/2015  . 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 Injury of kidney 04/14/2015  . Essential hypertension 04/14/2015  . Blood glucose elevated 04/14/2015  . Apnea, sleep 04/14/2015  . COPD (chronic obstructive pulmonary disease) ([place]) 04/14/2015  . DDD (degenerative disc disease), lumbar 08/01/2014  . Lumbar canal stenosis 05/11/2014  . Neuritis or radiculitis due to rupture of lumbar intervertebral disc 05/11/2014  . 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 Lumbosacral spondylosis 05/11/2014  . Thoracic and lumbosacral neuritis 05/11/2014  . Accelerating angina ([place]) 03/13/2014    Past Surgical History:  Procedure Laterality Date  . ABDOMINAL HYSTERECTOMY    . SPINE SURGERY      Prior to Admission medications   Medication Sig Start Date End Date Taking? Authorizing Provider  ACCU-CHEK AVIVA PLUS test strip 1 each by Other route 2 (two) times daily. 09/08/17   Karamalegos, Devonne Doughty, DO  ACCU-CHEK SOFTCLIX LANCETS lancets 1 each by Other route 2 (two) times daily. 09/08/17   Karamalegos, Devonne Doughty, DO  albuterol (PROVENTIL) (2.5 MG/3ML) 0.083% nebulizer solution Take 3 mLs (2.5 mg total) by nebulization every 6 (six) hours as needed for wheezing or shortness of breath. 04/08/18  Delman Kitten, MD  albuterol (VENTOLIN HFA) 108 (90 Base) MCG/ACT inhaler Inhale 2 puffs into the lungs every 6 (six) hours as needed for wheezing or shortness of breath. 05/12/17   Karamalegos, Devonne Doughty, DO  aspirin EC 325 MG tablet Take 325 mg by mouth daily.    [provider]  Blood Glucose Monitoring Suppl (ACCU-CHEK AVIVA PLUS) w/Device KIT  USE AS DIRECTED 01/12/17   Arlis Porta., MD  carvedilol (COREG) 25 MG tablet Take 1 tablet (25 mg total) by mouth 2 (two) times daily. 11/24/17   Karamalegos, Devonne Doughty, DO  clonazePAM (KLONOPIN) 0.5 MG tablet Take 0.5 mg by mouth at bedtime.  07/04/15   [provider]  conjugated estrogens (PREMARIN) vaginal cream Apply 0.1m (pea-sized amount)  just inside the vaginal introitus with a finger-tip on  Monday, Wednesday and Friday nights. 07/20/18   MZara CouncilA, PA-C  doxycycline (VIBRAMYCIN) 100 MG capsule Take 100 mg by mouth daily.  08/26/17   [provider]  estradiol (ESTRACE VAGINAL) 0.1 MG/GM vaginal cream Apply 0.577m(pea-sized amount)  just inside the vaginal introitus with a finger-tip on Monday, Wednesday and Friday nights. 07/20/18   McZara Council, PA-C  Fluticasone-Salmeterol (ADVAIR DISKUS) 250-50 MCG/DOSE AEPB Inhale 1 puff into the lungs 2 (two) times daily. 05/04/17   Karamalegos, AlDevonne DoughtyDO  furosemide (LASIX) 20 MG tablet Take 2 tablets (40 mg total) by mouth daily. Patient taking differently: Take 20 mg by mouth daily. May take 40 mg if needed 08/31/17   MoBettey CostaMD  gabapentin (NEURONTIN) 800 MG tablet Take 1 tablet (800 mg total) by mouth 3 (three) times daily. 12/01/16   HaArlis Porta MD  irbesartan-hydrochlorothiazide (AVALIDE) 300-12.5 MG tablet Take 1 tablet by mouth daily.  08/06/17   [provider]  metFORMIN (GLUCOPHAGE) 500 MG tablet TAKE ONE TABLET BY MOUTH EVERY MORNING WITH BREAKFAST 03/24/17   HaArlis Porta MD  montelukast (SINGULAIR) 10 MG tablet TAKE ONE TABLET BY MOUTH AT BEDTIME 03/24/17   HaArlis Porta MD  OXYCONTIN 10 MG 12 hr tablet Take 10 mg by mouth 2 (two) times daily.    [provider]  OXYGEN Inhale 2 L into the lungs continuous.    [provider]  pantoprazole (PROTONIX) 40 MG tablet Take 40 mg by mouth daily. 12/08/17 12/08/18  [provider]    polyethylene glycol powder (GLYCOLAX/MIRALAX) powder MIX 17 GRAMS OF POWDER IN 4 TO 8 OUNCES OF FLUID AND DRINK BY MOUTH ONCE A DAY 05/10/18   [provider]  potassium chloride (K-DUR) 10 MEQ tablet Take 1 tablet (10 mEq total) by mouth 2 (two) times daily. 10/12/17   Karamalegos, AlDevonne DoughtyDO  sucralfate (CARAFATE) 1 g tablet Take 1 tablet by mouth 4 (four) times daily -  before meals and at bedtime. 02/15/18 02/15/19  [provider]  tiotropium (SPIRIVA HANDIHALER) 18 MCG inhalation capsule Place 1 capsule (18 mcg total) into inhaler and inhale daily. 08/27/17   Karamalegos, AlDevonne DoughtyDO  valACYclovir (VALTREX) 1000 MG tablet Take 1 tablet (1,000 mg total) by mouth daily as needed (for fever blisters). 04/19/16   KaGladstone LighterMD  XTAMPZA ER 9 MG C12A Take 1 capsule by mouth 2 (two) times daily. 04/28/18   [provider]    Allergies Nitrofurantoin  Family History  Problem Relation Age of Onset  . Hypertension Mother   . Colon cancer Father   . Hypertension  Paternal Grandfather   . Bladder Cancer Neg Hx   . Kidney cancer Neg Hx     Social History Social History   Tobacco Use  . Smoking status: Former Smoker    Packs/day: 0.50    Last attempt to quit: 04/28/2015    Years since quitting: 3.4  . Smokeless tobacco: Never Used  Substance Use Topics  . Alcohol use: No    Alcohol/week: 0.0 standard drinks  . Drug use: No    Review of Systems  Constitutional: No fever/chills Eyes: No visual changes. ENT: No sore throat. Cardiovascular: Denies chest pain. Respiratory:  shortness of breath. Gastrointestinal: No abdominal pain.  No nausea, no vomiting.  No diarrhea.  No constipation. Genitourinary: Negative for dysuria. Musculoskeletal: Negative for back pain. Skin: Negative for rash. Neurological: Negative for headaches, focal weakness  ____________________________________________   PHYSICAL EXAM:  VITAL SIGNS: ED Triage Vitals  Enc  Vitals Group     BP 09/25/18 1214 (!) 155/79     Pulse Rate 09/25/18 1214 70     Resp 09/25/18 1214 20     Temp 09/25/18 1214 97.6 F (36.4 C)     Temp Source 09/25/18 1214 Oral     SpO2 09/25/18 1214 (!) 87 %     Weight 09/25/18 1217 221 lb (100.2 kg)     Height 09/25/18 1217 5' 5"  (1.651 m)     Head Circumference --      Peak Flow --      Pain Score 09/25/18 1217 8     Pain Loc --      Pain Edu? --      Excl. in [place]? --     Constitutional: Alert and oriented. Well appearing and in no acute distress. Eyes: Conjunctivae are normal. Head: Atraumatic. Nose: No congestion/rhinnorhea. Mouth/Throat: Mucous membranes are moist.  Oropharynx non-erythematous. Neck: No stridor.   Cardiovascular: Normal rate, regular rhythm. Grossly normal heart sounds.  Good peripheral circulation. Respiratory: Normal respiratory effort.  No retractions. Lungs crackles in bases Gastrointestinal: Soft and nontender. No distention. No abdominal bruits. No CVA tenderness. Musculoskeletal: No lower extremity tenderness trace edema. . Neurologic:  Normal speech and language. No gross focal neurologic deficits are appreciated. . Skin:  Skin is warm, dry and intact. No rash noted. Psychiatric: Mood and affect are normal. Speech and behavior are normal.  ____________________________________________   LABS (all labs ordered are listed, but only abnormal results are displayed)  Labs Reviewed  COMPREHENSIVE METABOLIC PANEL - Abnormal; Notable for the following components:      Result Value   Chloride 97 (*)    CO2 33 (*)    Glucose, Bld 220 (*)    BUN 34 (*)    Creatinine, Ser 1.05 (*)    Calcium 8.8 (*)    GFR calc non Af Amer 50 (*)    GFR calc Af Amer 58 (*)    All other components within normal limits  CBC - Abnormal; Notable for the following components:   Hemoglobin 11.2 (*)    HCT 34.7 (*)    All other components within normal limits  URINALYSIS, COMPLETE (UACMP) WITH MICROSCOPIC - Abnormal;  Notable for the following components:   Color, Urine STRAW (*)    APPearance CLEAR (*)    Bacteria, UA MANY (*)    All other components within normal limits  BRAIN NATRIURETIC PEPTIDE - Abnormal; Notable for the following components:   B Natriuretic Peptide 531.0 (*)    All other components  within normal limits  LIPASE, BLOOD  TROPONIN I   ____________________________________________  EKG  EKG read and interpreted by me shows normal sinus rhythm normal axis no acute changes ____________________________________________  RADIOLOGY  ED MD interpretation: Chest x-ray read by radiology reviewed by me shows congestive heart failure  Official radiology report(s): Dg Chest 2 View  Result Date: 09/25/2018 CLINICAL DATA:  Difficulty breathing. Increased over the past 2 days. EXAM: CHEST - 2 VIEW COMPARISON:  CT chest 05/24/2018 FINDINGS: There is bilateral interstitial thickening more prominent at the lung bases. There is no pleural effusion or pneumothorax. There is stable cardiomegaly. There is thoracic aortic atherosclerosis. The osseous structures are unremarkable. IMPRESSION: 1. Findings most consistent with CHF. Electronically Signed   By: Kathreen Devoid   On: 09/25/2018 13:42    ____________________________________________   PROCEDURES  Procedure(s) performed:   Procedures  Critical Care performed:   ____________________________________________   INITIAL IMPRESSION / ASSESSMENT AND PLAN / ED COURSE  She with congestive heart failure got Lasix O2 sat seems to have gotten a little bit better but now she is getting confused not quite sure why this is.  Confusion seems to actually be getting worse.  We will get her in the hospital         ____________________________________________   FINAL CLINICAL IMPRESSION(S) / ED DIAGNOSES  Final diagnoses:  Systolic congestive heart failure, unspecified HF chronicity ([place])  Delirium     ED Discharge Orders    None        Note:  This document was prepared using Dragon voice recognition software and may include unintentional dictation errors.    Nena Polio, MD 09/25/18 [phone]

## 2018-09-25 NOTE — ED Triage Notes (Signed)
Patient reports worsening SOB since Friday. Has increased home O2 from 2L to 3L. Patient 87% on 3L upon arrival. Patient denies fever. Reports pain in upper abdomen with deep breath. Also reports dizziness and headache. Denies fever but reports chills.

## 2018-09-25 NOTE — ED Notes (Signed)
Daughter let me know that patient is talking a bit "out of her head".  States pt asked about a man that has been dead for 5 years.  Daughter also notes pt is fidgety.  I went in and talked to the patient casually about her children, where she lives, etc to ascertain her mental status.  According to daughter pt answered all questions correctly.  Dr. Cinda Quest made aware of daughters statement.

## 2018-09-25 NOTE — ED Notes (Signed)
Error- CT Scan was not completed.

## 2018-09-25 NOTE — ED Notes (Signed)
Troponin level 0.03.  Dr. Cinda Quest informed.

## 2018-09-26 LAB — BASIC METABOLIC PANEL
Anion gap: 8 (ref 5–15)
BUN: 26 mg/dL — ABNORMAL HIGH (ref 8–23)
CALCIUM: 8.8 mg/dL — AB (ref 8.9–10.3)
CO2: 36 mmol/L — ABNORMAL HIGH (ref 22–32)
CREATININE: 1.03 mg/dL — AB (ref 0.44–1.00)
Chloride: 97 mmol/L — ABNORMAL LOW (ref 98–111)
GFR calc Af Amer: 60 mL/min — ABNORMAL LOW (ref >60)
GFR calc non Af Amer: 51 mL/min — ABNORMAL LOW (ref >60)
GLUCOSE: 99 mg/dL (ref 70–99)
POTASSIUM: 3.7 mmol/L (ref 3.5–5.1)
Sodium: 141 mmol/L (ref 135–145)

## 2018-09-26 LAB — IRON AND TIBC
Iron: 30 ug/dL (ref 28–170)
Saturation Ratios: 8 % — ABNORMAL LOW (ref 10.4–31.8)
TIBC: 368 ug/dL (ref 250–450)
UIBC: 338 ug/dL

## 2018-09-26 LAB — HEMOGLOBIN A1C
HEMOGLOBIN A1C: 6.4 % — AB (ref 4.8–5.6)
Mean Plasma Glucose: 136.98 mg/dL

## 2018-09-26 LAB — LIPID PANEL
Cholesterol: 156 mg/dL (ref 0–200)
HDL: 55 mg/dL (ref >40)
LDL Cholesterol: 89 mg/dL (ref 0–99)
TRIGLYCERIDES: 59 mg/dL (ref <150)
Total CHOL/HDL Ratio: 2.8 RATIO
VLDL: 12 mg/dL (ref 0–40)

## 2018-09-26 LAB — GLUCOSE, CAPILLARY
GLUCOSE-CAPILLARY: 108 mg/dL — AB (ref 70–99)
GLUCOSE-CAPILLARY: 136 mg/dL — AB (ref 70–99)
Glucose-Capillary: 102 mg/dL — ABNORMAL HIGH (ref 70–99)
Glucose-Capillary: 131 mg/dL — ABNORMAL HIGH (ref 70–99)
Glucose-Capillary: 114 mg/dL — ABNORMAL HIGH (ref 70–99)

## 2018-09-26 LAB — CBC
HCT: 32.3 % — ABNORMAL LOW (ref 35.0–47.0)
HEMOGLOBIN: 10.5 g/dL — AB (ref 12.0–16.0)
MCH: 27.6 pg (ref 26.0–34.0)
MCHC: 32.6 g/dL (ref 32.0–36.0)
MCV: 84.7 fL (ref 80.0–100.0)
PLATELETS: 171 10*3/uL (ref 150–440)
RBC: 3.81 MIL/uL (ref 3.80–5.20)
RDW: 14.2 % (ref 11.5–14.5)
WBC: 8 10*3/uL (ref 3.6–11.0)

## 2018-09-26 LAB — FERRITIN: FERRITIN: 15 ng/mL (ref 11–307)

## 2018-09-26 LAB — VITAMIN B12: VITAMIN B 12: 453 pg/mL (ref 180–914)

## 2018-09-26 LAB — FOLATE: Folate: 32 ng/mL (ref >5.9)

## 2018-09-26 MED ORDER — IRBESARTAN 150 MG PO TABS
150.0000 mg | ORAL_TABLET | Freq: Every day | ORAL | Status: DC
  Administered 2018-09-26 – 2018-09-28 (×3): 150 mg via ORAL
  Filled 2018-09-26 (×3): qty 1

## 2018-09-26 MED ORDER — POTASSIUM CHLORIDE CRYS ER 20 MEQ PO TBCR
20.0000 meq | EXTENDED_RELEASE_TABLET | Freq: Two times a day (BID) | ORAL | Status: DC
  Administered 2018-09-26 – 2018-09-28 (×5): 20 meq via ORAL
  Filled 2018-09-26 (×5): qty 1

## 2018-09-26 MED ORDER — SENNOSIDES-DOCUSATE SODIUM 8.6-50 MG PO TABS
1.0000 | ORAL_TABLET | Freq: Two times a day (BID) | ORAL | Status: DC | PRN
  Administered 2018-09-26 (×2): 1 via ORAL
  Filled 2018-09-26 (×3): qty 1

## 2018-09-26 NOTE — Evaluation (Signed)
Occupational Therapy Evaluation Patient Details Name: FUJIE DICKISON MRN: [id number] DOB: Mar 25, 1941 Today's Date: 09/26/2018    History of Present Illness Mrs. Swanton is a 77 y/o F with PMH CHF And COPD on 2Lnc home O2, who presented to ED with SOB and O2 saturations in the low 80s on 3L O2, swollen LEs, and reporting that she hasn't urinated as much as usual despite being on home 20mg  home Lasix. Pt with hypoxia requiring increased lasix and 4Lnc while in ED. Other PMH includes: T2DM, HTN, HLD, anemia and recurrent H. Pylori infection.    Clinical Impression   Pt seen for OT evaluation this date. Prior to hospital admission, pt was MOD I with fxl mobility (using 4WW), fxl transfers and self care ADLs.  Pt lives with her daughter and son-in-law in single story home.  Currently pt demonstrates slight impairments in standing tolerance requiring more frequent rest breaks to complete ADLs and fxl activity. Pt educated on EC techniques as well as her disease process as it relates to the completion of self care, PLB techniques, as well as chest wall mobilization techniques and verbalized understanding with OT.  Pt does not appear to have any acute OT needs or require f/u from OT standpoint at this time.    Follow Up Recommendations  No OT follow up    Equipment Recommendations  None recommended by OT    Recommendations for Other Services       Precautions / Restrictions Precautions Precautions: Fall Restrictions Weight Bearing Restrictions: No      Mobility Bed Mobility Overal bed mobility: Modified Independent             General bed mobility comments: requires increased time, HOB elevated 20 degrees to prevent SOB  Transfers Overall transfer level: Modified independent Equipment used: Rolling walker (2 wheeled)                  Balance Overall balance assessment: Mild deficits observed, not formally tested                                          ADL either performed or assessed with clinical judgement   ADL Overall ADL's : Modified independent                                             Vision Baseline Vision/History: Wears glasses Wears Glasses: At all times Patient Visual Report: No change from baseline       Perception     Praxis      Pertinent Vitals/Pain Pain Assessment: No/denies pain     Hand Dominance     Extremity/Trunk Assessment Upper Extremity Assessment Upper Extremity Assessment: Overall WFL for tasks assessed   Lower Extremity Assessment Lower Extremity Assessment: Defer to PT evaluation       Communication Communication Communication: No difficulties   Cognition Arousal/Alertness: Awake/alert Behavior During Therapy: WFL for tasks assessed/performed Overall Cognitive Status: Within Functional Limits for tasks assessed                                     General Comments       Exercises Other Exercises Other Exercises: Pt educated on  EC techniques to complete ADLs/IADLs-her and dtr who is present on evaluation verbalize understanding.  Other Exercises: Pt and daughter educated on chest wall mobilization techniques with demonstration and verbalize understanding.  Other Exercises: Pt and daughter educated on PLB technique and disease process as it relates to self care and verbalize understanding.    Shoulder Instructions      Home Living Family/patient expects to be discharged to:: Private residence Living Arrangements: Children Available Help at Discharge: Family Type of Home: House Home Access: Stairs to enter Technical brewer of Steps: 2 Entrance Stairs-Rails: Right Home Layout: One level     Bathroom Shower/Tub: Occupational psychologist: Handicapped height Bathroom Accessibility: Yes How Accessible: Accessible via wheelchair [place]: [place] - 4 wheels;Shower seat;Grab bars - tub/shower          Prior  Functioning/Environment Level of Independence: Independent with assistive device(s)  Gait / Transfers Assistance Needed: Mod I with 2BW ADL's / Homemaking Assistance Needed: requires family assist for IADLs-cooking, cleaning and laundry   Comments: Pt does not drive, family takes her to grocery and doctor's office, states she is not very active in church any more. Uses 4WW daily, I with self care, MOD I with LB dressing-uses slip on shoes and no socks typically d/t limited hip ROM        OT Problem List: Decreased activity tolerance      OT Treatment/Interventions:      OT Goals(Current goals can be found in the care plan section) Acute Rehab OT Goals Patient Stated Goal: to go home OT Goal Formulation: All assessment and education complete, DC therapy  OT Frequency:     Barriers to D/C:            Co-evaluation              AM-PAC PT "6 Clicks" Daily Activity     Outcome Measure Help from another person eating meals?: None Help from another person taking care of personal grooming?: None Help from another person toileting, which includes using toliet, bedpan, or urinal?: None Help from another person bathing (including washing, rinsing, drying)?: None Help from another person to put on and taking off regular upper body clothing?: None Help from another person to put on and taking off regular lower body clothing?: None 6 Click Score: 24   End of Session Equipment Utilized During Treatment: Gait belt  Activity Tolerance: Patient tolerated treatment well Patient left: Other (comment);with call bell/phone within reach(seated EOB with family member present in room)  OT Visit Diagnosis: Muscle weakness (generalized) (M62.81)                Time: 3893-7342 OT Time Calculation (min): 31 min Charges:  OT General Charges $OT Visit: 1 Visit OT Evaluation $OT Eval Moderate Complexity: 1 Mod OT Treatments $Self Care/Home Management : 23-37 mins  Gerrianne Scale, MS,  OTR/L ascom [phone] or [phone] 09/26/18, 4:36 PM

## 2018-09-26 NOTE — Progress Notes (Signed)
[facility] [place] NAME: Megan Bolton    MR#:  [id number]  DATE OF BIRTH:  20-Sep-1941  SUBJECTIVE:  CHIEF COMPLAINT:   Chief Complaint  Patient presents with  . Shortness of Breath   Came with worsening shortness of breath.  Noted to have acute CHF.  Having good diuresis in response to the therapy and feeling slightly better.  REVIEW OF SYSTEMS:  CONSTITUTIONAL: No fever, fatigue or weakness.  EYES: No blurred or double vision.  EARS, NOSE, AND THROAT: No tinnitus or ear pain.  RESPIRATORY: No cough, had shortness of breath, no wheezing or hemoptysis.  CARDIOVASCULAR: No chest pain, orthopnea, edema.  GASTROINTESTINAL: No nausea, vomiting, diarrhea or abdominal pain.  GENITOURINARY: No dysuria, hematuria.  ENDOCRINE: No polyuria, nocturia,  HEMATOLOGY: No anemia, easy bruising or bleeding SKIN: No rash or lesion. MUSCULOSKELETAL: No joint pain or arthritis.   NEUROLOGIC: No tingling, numbness, weakness.  PSYCHIATRY: No anxiety or depression.   ROS  DRUG ALLERGIES:   Allergies  Allergen Reactions  . Nitrofurantoin Anaphylaxis    VITALS:  Blood pressure (!) 156/73, pulse 65, temperature 98.4 F (36.9 C), temperature source Oral, resp. rate 18, height 5\' 5"  (1.651 m), weight 98.3 kg, SpO2 97 %.  PHYSICAL EXAMINATION:  GENERAL:  77 y.o.-year-old patient lying in the bed with no acute distress.  EYES: Pupils equal, round, reactive to light and accommodation. No scleral icterus. Extraocular muscles intact.  HEENT: Head atraumatic, normocephalic. Oropharynx and nasopharynx clear.  NECK:  Supple, no jugular venous distention. No thyroid enlargement, no tenderness.  LUNGS: Normal breath sounds bilaterally, no wheezing, b/l crepitation. No use of accessory muscles of respiration.  CARDIOVASCULAR: S1, S2 normal. No murmurs, rubs, or gallops.  ABDOMEN: Soft, nontender, nondistended. Bowel sounds present. No organomegaly or mass.   EXTREMITIES: No pedal edema, cyanosis, or clubbing.  NEUROLOGIC: Cranial nerves II through XII are intact. Muscle strength 4/5 in all extremities. Sensation intact. Gait not checked.  PSYCHIATRIC: The patient is alert and oriented x 3.  SKIN: No obvious rash, lesion, or ulcer.   Physical Exam LABORATORY PANEL:   CBC Recent Labs  Lab 09/26/18 0442  WBC 8.0  HGB 10.5*  HCT 32.3*  PLT 171   ------------------------------------------------------------------------------------------------------------------  Chemistries  Recent Labs  Lab 09/25/18 1220 09/26/18 0442  NA 139 141  K 4.7 3.7  CL 97* 97*  CO2 33* 36*  GLUCOSE 220* 99  BUN 34* 26*  CREATININE 1.05* 1.03*  CALCIUM 8.8* 8.8*  AST 27  --   ALT 40  --   ALKPHOS 62  --   BILITOT 1.1  --    ------------------------------------------------------------------------------------------------------------------  Cardiac Enzymes Recent Labs  Lab 09/25/18 1220  TROPONINI 0.03*   ------------------------------------------------------------------------------------------------------------------  RADIOLOGY:  Dg Chest 2 View  Result Date: 09/25/2018 CLINICAL DATA:  Difficulty breathing. Increased over the past 2 days. EXAM: CHEST - 2 VIEW COMPARISON:  CT chest 05/24/2018 FINDINGS: There is bilateral interstitial thickening more prominent at the lung bases. There is no pleural effusion or pneumothorax. There is stable cardiomegaly. There is thoracic aortic atherosclerosis. The osseous structures are unremarkable. IMPRESSION: 1. Findings most consistent with CHF. Electronically Signed   By: Kathreen Devoid   On: 09/25/2018 13:42   Ct Head Wo Contrast  Result Date: 09/25/2018 CLINICAL DATA:  Confusion.  Recent fall. EXAM: CT HEAD WITHOUT CONTRAST TECHNIQUE: Contiguous axial images were obtained from the base of the skull through the vertex without intravenous contrast. COMPARISON:  10/01/2008 head CT. FINDINGS: Brain: No evidence of  parenchymal hemorrhage or extra-axial fluid collection. No mass lesion, mass effect, or midline shift. No CT evidence of acute infarction. Nonspecific mild subcortical and periventricular white matter hypodensity, most in keeping with chronic small vessel ischemic change. Generalized cerebral volume loss. No ventriculomegaly. Vascular: No acute abnormality. Skull: No evidence of calvarial fracture. Sinuses/Orbits: No fluid levels. Mucoperiosteal thickening in the bilateral ethmoidal air cells with partial opacification. Other:  The mastoid air cells are unopacified. IMPRESSION: 1. No evidence of acute intracranial abnormality. No evidence of calvarial fracture. 2. Generalized cerebral volume loss and mild chronic small vessel ischemic changes in the cerebral white matter. 3. Mild chronic appearing paranasal sinusitis. Electronically Signed   By: Ilona Sorrel M.D.   On: 09/25/2018 18:03    ASSESSMENT AND PLAN:   Active Problems:   Acute on chronic respiratory failure with hypoxia (HCC)  Acute on chronic hypoxic respiratory failure secondary to acute exacerbation of chronic diastolic heart failure- BNP elevated and CXR findings consistent with CHF. Last ECHO 04/2018 with normal EF and G1DD. On 2L O2 at home. Follows with Dr. Clayborn Bigness as an outpatient. - Lasix 20mg  IV bid - cardiology consult appreciated. - continue coreg - cardiac monitoring - daily weights - strict I/O  Intermittent AMS- resolved on my exam. May have been related to low BP and hypoxia. No focal deficits to suggest stroke. -No acute changes on CT head, as patient had recent fall -She is completely alert and oriented now.  COPD- stable, no wheezing to suggest acute exacerbation. - continue home inhalers  Hypertension- had 1 low BP to 79/60 in the ED, but otherwise has been hypertensive. -Now blood pressure improved, restarted on Coreg and is irbesartan.  Type 2 diabetes- on metformin at home. Glucose 220 in the ED. -  sensitive SSI - check a1c-6.4.  Hyperlipidemia- not on a statin. - start lipitor 40mg  daily - checked lipid panel-LDL 89 HDL 55.  Normocytic anemia- Hgb at baseline - check anemia panel  Recent H. Pylori infection- just finished treatment yesterday. Epigastric abdominal pain has resolved - monitor  Chronic pain- stable - continue home oxycontin, xtampza, and gabapentin.   All the records are reviewed and case discussed with Care Management/Social Workerr. Management plans discussed with the patient, family and they are in agreement.  CODE STATUS: Full code.  TOTAL TIME TAKING CARE OF THIS PATIENT: 35 minutes.    POSSIBLE D/C IN 1-2 DAYS, DEPENDING ON CLINICAL CONDITION.   Vaughan Basta M.D on 09/26/2018   Between 7am to 6pm - Pager - [phone]  After 6pm go to [email or web address] - password [email or web address] [facility]  Office  [phone]  CC: Primary care physician; Baxter Hire, MD  Note: This dictation was prepared with Dragon dictation along with smaller phrase technology. Any transcriptional errors that result from this process are unintentional.

## 2018-09-26 NOTE — Consult Note (Signed)
[facility] Cardiology  CARDIOLOGY CONSULT NOTE  Patient ID: Megan Bolton MRN: [id number] DOB/AGE: 05-25-1941 77 y.o.  Admit date: 09/25/2018 Referring Physician Dr. Brett Albino Primary Physician Dr. Harrel Lemon  Primary Cardiologist Dr. Clayborn Bigness Reason for Consultation Acute heart failure exacerbation   HPI: Ms. Schoneman is a 77 year old female with a past medical history significant for chronic diastolic heart failure, COPD on 2 L O2 at home, type 2 diabetes, hypertension, and hyperlipidemia who presented to the ED on 09/25/18 for a 3 day history of progressive worsening shortness of breath and lower extremity swelling. O2 sats were 87% and was put on 4L of O2. Also noted to have intermittent confusion while in the ED.  Labs were significant for BNP of 531 and CXR consistent with CHF.    Today, reports shortness of breath and lower extremity swelling has slightly improved.  Was recently treated for H. Pylori and endorses mild nausea and abdominal distention but denies vomiting or diarrhea. Denies chest pain or palpitations.   Followed in outpatient cardiology with Dr. Clayborn Bigness.  Most recent echocardiogram on 05/25/18 revealed grade 1 diastolic dysfunction with normal LVF, EF estimated at 55-60%.   Review of systems complete and found to be negative unless listed above     Past Medical History:  Diagnosis Date  . CHF (congestive heart failure) ([place])   . COPD (chronic obstructive pulmonary disease) ([place])   . Diabetes mellitus without complication ([place])   . Diastolic heart failure ([place])   . Hyperlipidemia   . Hypertension     Past Surgical History:  Procedure Laterality Date  . ABDOMINAL HYSTERECTOMY    . SPINE SURGERY      Medications Prior to Admission  Medication Sig Dispense Refill Last Dose  . ACCU-CHEK AVIVA PLUS test strip 1 each by Other route 2 (two) times daily. 100 each 11 Taking  . ACCU-CHEK SOFTCLIX LANCETS lancets 1 each by Other route 2 (two) times daily. 100 each 11 Taking  .  albuterol (PROVENTIL) (2.5 MG/3ML) 0.083% nebulizer solution Take 3 mLs (2.5 mg total) by nebulization every 6 (six) hours as needed for wheezing or shortness of breath. 75 mL 1 Unknown at PRN  . albuterol (VENTOLIN HFA) 108 (90 Base) MCG/ACT inhaler Inhale 2 puffs into the lungs every 6 (six) hours as needed for wheezing or shortness of breath. 2 Inhaler 12 Unknown at PRN  . aspirin EC 325 MG tablet Take 325 mg by mouth daily.   09/24/2018 at Unknown time  . Blood Glucose Monitoring Suppl (ACCU-CHEK AVIVA PLUS) w/Device KIT USE AS DIRECTED 1 kit 12 Taking  . carvedilol (COREG) 25 MG tablet Take 1 tablet (25 mg total) by mouth 2 (two) times daily. 180 tablet 3 09/24/2018 at 1900  . clonazePAM (KLONOPIN) 0.5 MG tablet Take 0.5 mg by mouth at bedtime.    Unknown at Unknown  . conjugated estrogens (PREMARIN) vaginal cream Apply 0.65m (pea-sized amount)  just inside the vaginal introitus with a finger-tip on  Monday, Wednesday and Friday nights. 30 g 12 As directed at As directed  . Fluticasone-Salmeterol (ADVAIR DISKUS) 250-50 MCG/DOSE AEPB Inhale 1 puff into the lungs 2 (two) times daily. 1 each 12 09/24/2018 at 1900  . furosemide (LASIX) 20 MG tablet Take 2 tablets (40 mg total) by mouth daily. 30 tablet 6 09/24/2018 at 0800  . gabapentin (NEURONTIN) 800 MG tablet Take 1 tablet (800 mg total) by mouth 3 (three) times daily. 270 tablet 3 09/24/2018 at 1900  . irbesartan-hydrochlorothiazide (  AVALIDE) 300-12.5 MG tablet Take 1 tablet by mouth daily.    09/24/2018 at 0800  . metFORMIN (GLUCOPHAGE) 500 MG tablet TAKE ONE TABLET BY MOUTH EVERY MORNING WITH BREAKFAST (Patient taking differently: Take 500 mg by mouth daily. ) 90 tablet 3 09/24/2018 at 0800  . montelukast (SINGULAIR) 10 MG tablet TAKE ONE TABLET BY MOUTH AT BEDTIME 30 tablet 12 09/24/2018 at 2100  . ondansetron (ZOFRAN-ODT) 4 MG disintegrating tablet Take 4 mg by mouth every 8 (eight) hours as needed for nausea/vomiting.  1 Unknown at PRN  . OXYCONTIN  10 MG 12 hr tablet Take 10 mg by mouth 2 (two) times daily.   Unknown at Unknown  . OXYGEN Inhale 2 L into the lungs continuous.   Taking  . pantoprazole (PROTONIX) 40 MG tablet Take 40 mg by mouth 2 (two) times daily before a meal.    09/24/2018 at 1900  . polyethylene glycol (MIRALAX / GLYCOLAX) packet Take 17 g by mouth daily.   1 09/24/2018 at Unknown time  . potassium chloride (K-DUR) 10 MEQ tablet Take 1 tablet (10 mEq total) by mouth 2 (two) times daily. 60 tablet 5 09/24/2018 at 1900  . sucralfate (CARAFATE) 1 g tablet Take 1 tablet by mouth 4 (four) times daily -  before meals and at bedtime.   09/24/2018 at 2100  . tiotropium (SPIRIVA HANDIHALER) 18 MCG inhalation capsule Place 1 capsule (18 mcg total) into inhaler and inhale daily. 30 capsule 6 Unknown at Unknown  . valACYclovir (VALTREX) 1000 MG tablet Take 1 tablet (1,000 mg total) by mouth daily as needed (for fever blisters). 30 tablet 12 Unknown at PRN  . estradiol (ESTRACE VAGINAL) 0.1 MG/GM vaginal cream Apply 0.70m (pea-sized amount)  just inside the vaginal introitus with a finger-tip on Monday, Wednesday and Friday nights. (Patient not taking: Reported on 09/25/2018) 30 g 12 Not Taking at Unknown time   Social History   Socioeconomic History  . Marital status: Widowed    Spouse name: Not on file  . Number of children: Not on file  . Years of education: Not on file  . Highest education level: Not on file  Occupational History  . Occupation: retired  SScientific laboratory technician . Financial resource strain: Not on file  . Food insecurity:    Worry: Not on file    Inability: Not on file  . Transportation needs:    Medical: Not on file    Non-medical: Not on file  Tobacco Use  . Smoking status: Former Smoker    Packs/day: 0.50    Last attempt to quit: 04/28/2015    Years since quitting: 3.4  . Smokeless tobacco: Never Used  Substance and Sexual Activity  . Alcohol use: No    Alcohol/week: 0.0 standard drinks  . Drug use: No  . Sexual  activity: Never  Lifestyle  . Physical activity:    Days per week: Not on file    Minutes per session: Not on file  . Stress: Not on file  Relationships  . Social connections:    Talks on phone: Not on file    Gets together: Not on file    Attends religious service: Not on file    Active member of club or organization: Not on file    Attends meetings of clubs or organizations: Not on file    Relationship status: Not on file  . Intimate partner violence:    Fear of current or ex partner: Not on file  Emotionally abused: Not on file    Physically abused: Not on file    Forced sexual activity: Not on file  Other Topics Concern  . Not on file  Social History Narrative  . Not on file    Family History  Problem Relation Age of Onset  . Hypertension Mother   . Colon cancer Father   . Hypertension Paternal Grandfather   . Bladder Cancer Neg Hx   . Kidney cancer Neg Hx       Review of systems complete and found to be negative unless listed above      PHYSICAL EXAM  General: Well developed, well nourished, in no acute distress. Sitting up in bed on 3 L of O2 HEENT:  Normocephalic and atramatic Neck:  No JVD.  Lungs: Clear bilaterally to auscultation and percussion. Heart: HRRR . Normal S1 and S2 without gallops or murmurs.  Abdomen: Bowel sounds are positive, abdomen soft and non-tender  Msk:  Back normal. Normal strength and tone for age. Gait not assessed  Extremities: No clubbing or cyanosis.  Mild peripheral edema bilaterally    Neuro: Alert and oriented X 3. Psych:  Good affect, responds appropriately  Labs:   Lab Results  Component Value Date   WBC 8.0 09/26/2018   HGB 10.5 (L) 09/26/2018   HCT 32.3 (L) 09/26/2018   MCV 84.7 09/26/2018   PLT 171 09/26/2018    Recent Labs  Lab 09/25/18 1220 09/26/18 0442  NA 139 141  K 4.7 3.7  CL 97* 97*  CO2 33* 36*  BUN 34* 26*  CREATININE 1.05* 1.03*  CALCIUM 8.8* 8.8*  PROT 7.0  --   BILITOT 1.1  --    ALKPHOS 62  --   ALT 40  --   AST 27  --   GLUCOSE 220* 99   Lab Results  Component Value Date   CKTOTAL 52 10/02/2013   CKMB < 0.5 (L) 10/02/2013   TROPONINI 0.03 (HH) 09/25/2018    Lab Results  Component Value Date   CHOL 156 09/26/2018   CHOL 154 12/01/2016   CHOL 160 01/03/2013   Lab Results  Component Value Date   HDL 55 09/26/2018   HDL 74 12/01/2016   HDL 71 (H) 01/03/2013   Lab Results  Component Value Date   LDLCALC 89 09/26/2018   LDLCALC 62 12/01/2016   LDLCALC 75 01/03/2013   Lab Results  Component Value Date   TRIG 59 09/26/2018   TRIG 90 12/01/2016   TRIG 68 01/03/2013   Lab Results  Component Value Date   CHOLHDL 2.8 09/26/2018   CHOLHDL 2.1 12/01/2016   No results found for: LDLDIRECT    Radiology: Dg Chest 2 View  Result Date: 09/25/2018 CLINICAL DATA:  Difficulty breathing. Increased over the past 2 days. EXAM: CHEST - 2 VIEW COMPARISON:  CT chest 05/24/2018 FINDINGS: There is bilateral interstitial thickening more prominent at the lung bases. There is no pleural effusion or pneumothorax. There is stable cardiomegaly. There is thoracic aortic atherosclerosis. The osseous structures are unremarkable. IMPRESSION: 1. Findings most consistent with CHF. Electronically Signed   By: Kathreen Devoid   On: 09/25/2018 13:42   Ct Head Wo Contrast  Result Date: 09/25/2018 CLINICAL DATA:  Confusion.  Recent fall. EXAM: CT HEAD WITHOUT CONTRAST TECHNIQUE: Contiguous axial images were obtained from the base of the skull through the vertex without intravenous contrast. COMPARISON:  10/01/2008 head CT. FINDINGS: Brain: No evidence of parenchymal hemorrhage or extra-axial fluid  collection. No mass lesion, mass effect, or midline shift. No CT evidence of acute infarction. Nonspecific mild subcortical and periventricular white matter hypodensity, most in keeping with chronic small vessel ischemic change. Generalized cerebral volume loss. No ventriculomegaly. Vascular: No  acute abnormality. Skull: No evidence of calvarial fracture. Sinuses/Orbits: No fluid levels. Mucoperiosteal thickening in the bilateral ethmoidal air cells with partial opacification. Other:  The mastoid air cells are unopacified. IMPRESSION: 1. No evidence of acute intracranial abnormality. No evidence of calvarial fracture. 2. Generalized cerebral volume loss and mild chronic small vessel ischemic changes in the cerebral white matter. 3. Mild chronic appearing paranasal sinusitis. Electronically Signed   By: Ilona Sorrel M.D.   On: 09/25/2018 18:03    EKG: Sinus rhythm, low voltage  Vent rate: 70bpm  ASSESSMENT AND PLAN:  1. Acute exacerbation of chronic diastolic heart failure   -Agree with current plan of Lasix 1m twice daily; will continue monitoring renal function  -Daily weights recommended  -Limit sodium intake  -Outpatient follow up with Dr. CClayborn Bigness 2. Hypertension   -Irbesartan held due to hypotension yesterday; okay to resume   -Continue carvedilol 285mtwice daily 3.  Intermittent AMS  -Has appeared to resolve; CT negative for acute intracranial abnormality  4.  COPD  -Continue home treatment regimen  5.  Hyperlipidemia   -Agree with addition of Lipitor 4090maily  6.  Type 2 Diabetes  -Continue with home treatment regimen    The history, physical exam findings, and plan of care were all discussed with Dr. KenBartholome Billnd all decision making was made in collaboration.   Signed: NicAvie Arenas-C 09/26/2018, 8:44 AM

## 2018-09-26 NOTE — Evaluation (Addendum)
Physical Therapy Evaluation Patient Details Name: Megan Bolton MRN: [id number] DOB: 04/24/1941 Today's Date: 09/26/2018   History of Present Illness  77 yo female with onset of AMS with confusion was admitted, noted mild increased troponin, CHF and increased hypoxia in the ED. Has increased O2 from 2L at home to 3L in hosp. 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  Her control of O2 was better with controlled pace, and noted her O2 at 3L was better with less conversation during gait.  Has some reduction in coordination on LLE and talked with her about following her use of HHPT with outpatient to increase endurance and balance with all movement.  Follow acutely for same with RW vs rollator for gait.    Follow Up Recommendations Home health PT    Equipment Recommendations  Rolling walker with 5" wheels    Recommendations for Other Services       Precautions / Restrictions Precautions Precautions: Fall(telemetry including O2 sats) Restrictions Weight Bearing Restrictions: No      Mobility  Bed Mobility Overal bed mobility: Modified Independent                Transfers Overall transfer level: Modified independent Equipment used: Rolling walker (2 wheeled)             General transfer comment: rests wuth arns on the walker  Ambulation/Gait Ambulation/Gait assistance: Supervision Gait Distance (Feet): 100 Feet(20 x 5) Assistive device: Rolling walker (2 wheeled)(1 Person to manage lines) Gait Pattern/deviations: Step-through pattern;Decreased stride length;Wide base of support;Trunk flexed Gait velocity: reduced   General Gait Details: rests wirh arms on walker  Stairs            Wheelchair Mobility    Modified Rankin (Stroke Patients Only)       Balance Overall balance assessment: History of Falls;Needs assistance Sitting-balance support: Feet  supported Sitting balance-Leahy Scale: Good     Standing balance support: Bilateral upper extremity supported;During functional activity Standing balance-Leahy Scale: Fair Standing balance comment: less than fair dynamically                             Pertinent Vitals/Pain Pain Assessment: No/denies pain    Home Living Family/patient expects to be discharged to:: Private residence Living Arrangements: Children Available Help at Discharge: Family Type of Home: House Home Access: Stairs to enter Entrance Stairs-Rails: Right Entrance Stairs-Number of Steps: 3   Home Equipment: [place] - 4 wheels;Shower seat;Grab bars - tub/shower      Prior Function Level of Independence: Independent with assistive device(s)   Gait / Transfers Assistance Needed: Mod I with 8ZM  ADL's / Homemaking Assistance Needed: requires family assist for IADLs-cooking, cleaning and laundry  Comments: able to dress and bathe, gets out with family help for transport     Hand Dominance        Extremity/Trunk Assessment   Upper Extremity Assessment Upper Extremity Assessment: Overall WFL for tasks assessed    Lower Extremity Assessment Lower Extremity Assessment: Overall WFL for tasks assessed    Cervical / Trunk Assessment Cervical / Trunk Assessment: Normal  Communication   Communication: No difficulties  Cognition Arousal/Alertness: Awake/alert Behavior During Therapy: WFL for tasks assessed/performed Overall Cognitive Status: Within Functional Limits for tasks assessed  General Comments      Exercises     Assessment/Plan    PT Assessment Patient needs continued PT services  PT Problem List Decreased range of motion;Decreased activity tolerance;Decreased balance;Decreased mobility;Decreased coordination;Decreased knowledge of use of DME;Decreased safety awareness;Cardiopulmonary status limiting  activity;Obesity;Decreased skin integrity       PT Treatment Interventions DME instruction;Gait training;Stair training;Functional mobility training;Therapeutic activities;Therapeutic exercise;Balance training;Neuromuscular re-education;Patient/family education    PT Goals (Current goals can be found in the Care Plan section)  Acute Rehab PT Goals Patient Stated Goal: to go home PT Goal Formulation: With patient/family Time For Goal Achievement: 10/10/18 Potential to Achieve Goals: Good    Frequency Min 2X/week   Barriers to discharge Inaccessible home environment Stairs with one rail to enter house    Co-evaluation               AM-PAC PT "6 Clicks" Daily Activity  Outcome Measure Difficulty turning over in bed (including adjusting bedclothes, sheets and blankets)?: A Little Difficulty moving from lying on back to sitting on the side of the bed? : Unable Difficulty sitting down on and standing up from a chair with arms (e.g., wheelchair, bedside commode, etc,.)?: A Little Help needed moving to and from a bed to chair (including a wheelchair)?: A Little Help needed walking in hospital room?: A Little Help needed climbing 3-5 steps with a railing? : A Lot 6 Click Score: 15    End of Session Equipment Utilized During Treatment: Gait belt Activity Tolerance: Patient tolerated treatment well;Patient limited by fatigue Patient left: in bed;with call bell/phone within reach;with family/visitor present(pt was permitted to get up alone to BR) Nurse Communication: Mobility status PT Visit Diagnosis: Unsteadiness on feet (R26.81);History of falling (Z91.81);Other (comment);Difficulty in walking, not elsewhere classified (R26.2)    Time: 3790-2409 PT Time Calculation (min) (ACUTE ONLY): 30 min   Charges:   PT Evaluation $PT Eval Moderate Complexity: 1 Mod PT Treatments $Gait Training: 8-22 mins       Ramond Dial 09/26/2018, 9:39 PM   Mee Hives, PT MS Acute Rehab  Dept. Number: [place] [facility] [place]

## 2018-09-26 NOTE — Care Management Note (Addendum)
Case Management Note  Patient Details  Name: Megan Bolton MRN: [id number] Date of Birth: February 18, 1941  Subjective/Objective:  Patient is from home with acute on chronic respiratory failure.  Chronic CHF.  Chronic home O2 with [employer].    Patient states she is much weaker than baseline.  Lives at home with daughter and son in law.  She has a walker and a cane and uses PRN.  She does not drive.  Her daughter or a friend drives her to appointments.  She denies difficulty obtaining medications.   She is current with Dr. Edwina Barth PCP.   She has a scale at home and weighs herself everyday.                    Action/Plan:Placed order for PT/OT evaluation for weakness.  Offered home health choice to patient   Heads up referral made to [facility] for ReDS vest, home health RN, PT, OT.  ReDS vest protocol on chart for MD signature.  [place] for heart failure clinic appointment.  Expected Discharge Date:                  Expected Discharge Plan:  [place]  In-House Referral:     Discharge planning Services  CM Consult  Post Acute Care Choice:    Choice offered to:     DME Arranged:    DME Agency:     HH Arranged:  RN, PT, OT HH Agency:     Status of Service:  In process, will continue to follow  If discussed at Long Length of Stay Meetings, dates discussed:    Additional Comments:  Elza Rafter, RN 09/26/2018, 10:37 AM

## 2018-09-27 LAB — GLUCOSE, CAPILLARY
GLUCOSE-CAPILLARY: 159 mg/dL — AB (ref 70–99)
GLUCOSE-CAPILLARY: 130 mg/dL — AB (ref 70–99)
Glucose-Capillary: 121 mg/dL — ABNORMAL HIGH (ref 70–99)
Glucose-Capillary: 101 mg/dL — ABNORMAL HIGH (ref 70–99)

## 2018-09-27 LAB — BASIC METABOLIC PANEL
Anion gap: 11 (ref 5–15)
BUN: 26 mg/dL — AB (ref 8–23)
CHLORIDE: 96 mmol/L — AB (ref 98–111)
CO2: 34 mmol/L — ABNORMAL HIGH (ref 22–32)
CREATININE: 1.16 mg/dL — AB (ref 0.44–1.00)
Calcium: 8.9 mg/dL (ref 8.9–10.3)
GFR calc Af Amer: 52 mL/min — ABNORMAL LOW (ref >60)
GFR calc non Af Amer: 45 mL/min — ABNORMAL LOW (ref >60)
GLUCOSE: 133 mg/dL — AB (ref 70–99)
POTASSIUM: 4.3 mmol/L (ref 3.5–5.1)
SODIUM: 141 mmol/L (ref 135–145)

## 2018-09-27 LAB — BRAIN NATRIURETIC PEPTIDE: B NATRIURETIC PEPTIDE 5: 421 pg/mL — AB (ref 0.0–100.0)

## 2018-09-27 MED ORDER — FUROSEMIDE 20 MG PO TABS
20.0000 mg | ORAL_TABLET | Freq: Every day | ORAL | Status: DC
  Administered 2018-09-27 – 2018-09-28 (×2): 20 mg via ORAL
  Filled 2018-09-27 (×2): qty 1

## 2018-09-27 MED ORDER — POLYETHYLENE GLYCOL 3350 17 G PO PACK
2.0000 | PACK | Freq: Every day | ORAL | Status: DC | PRN
  Administered 2018-09-27: 34 g via ORAL
  Filled 2018-09-27: qty 2

## 2018-09-27 MED ORDER — ALUM & MAG HYDROXIDE-SIMETH 200-200-20 MG/5ML PO SUSP
15.0000 mL | Freq: Four times a day (QID) | ORAL | Status: DC | PRN
  Administered 2018-09-27: 15 mL via ORAL

## 2018-09-27 MED ORDER — CARVEDILOL 12.5 MG PO TABS
12.5000 mg | ORAL_TABLET | Freq: Two times a day (BID) | ORAL | Status: DC
  Administered 2018-09-27 – 2018-09-28 (×2): 12.5 mg via ORAL
  Filled 2018-09-27 (×2): qty 1

## 2018-09-27 MED ORDER — PREMIER PROTEIN SHAKE
11.0000 [oz_av] | Freq: Two times a day (BID) | ORAL | Status: DC
  Administered 2018-09-27 – 2018-09-28 (×2): 11 [oz_av] via ORAL

## 2018-09-27 MED ORDER — ADULT MULTIVITAMIN W/MINERALS CH
1.0000 | ORAL_TABLET | Freq: Every day | ORAL | Status: DC
  Administered 2018-09-27 – 2018-09-28 (×2): 1 via ORAL
  Filled 2018-09-27 (×2): qty 1

## 2018-09-27 NOTE — Progress Notes (Signed)
Physical Therapy Treatment Patient Details Name: Megan Bolton MRN: [id number] DOB: 1941-01-10 Today's Date: 09/27/2018    History of Present Illness 77 yo female with onset of AMS with confusion was admitted, noted mild increased troponin, CHF and increased hypoxia in the ED. Has increased O2 from 2L at home to 3L in hosp. 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  Pt required use of RW for ambulation but demonstrated ability to stand in quiet stance without UE support.  She was receptive to all there ex introduced as well as education provided by PT.  PT provided education regarding safe STS and use of RW for mobility and pt was able to demonstrate improvement during treatment.  She expressed interest in participating with [facility] PT and stated that she would be adding there ex introduced today to her routine.  PT emphasized importance of having someone standing near PT when performing balance there ex for safety and pt expressed understanding.  Pt on 4L of O2 throughout treatment and O2 sats remained WNL.  Pt will continue to benefit from skilled PT with focus on balance, coordination, strengthening and tolerance to activity.  Home health PT still recommended for after discharge.  Follow Up Recommendations  Home health PT     Equipment Recommendations  Rolling walker with 5" wheels    Recommendations for Other Services       Precautions / Restrictions Precautions Precautions: Fall(telemetry including O2 sats) Restrictions Weight Bearing Restrictions: No    Mobility  Bed Mobility Overal bed mobility: Modified Independent                Transfers Overall transfer level: Needs assistance Equipment used: Rolling walker (2 wheeled) Transfers: Sit to/from Stand Sit to Stand: Supervision         General transfer comment: Able to stand without physical assistance  but with tendency to sit down without controlled descent.  PT educated concerning controlled descent and pt was able to demonstrate.  Ambulation/Gait Ambulation/Gait assistance: Supervision Gait Distance (Feet): 20 Feet Assistive device: Rolling walker (2 wheeled)(1 Person to manage lines) Gait Pattern/deviations: Step-through pattern;Decreased stride length;Wide base of support;Trunk flexed   Gait velocity interpretation: 1.31 - 2.62 ft/sec, indicative of limited community ambulator General Gait Details: Tendency to lean on RW but able to stand upright without hands on RW, fair foot clearance and management of RW.   Stairs             Wheelchair Mobility    Modified Rankin (Stroke Patients Only)       Balance Overall balance assessment: History of Falls;Needs assistance Sitting-balance support: Feet supported Sitting balance-Leahy Scale: Good     Standing balance support: Bilateral upper extremity supported;During functional activity Standing balance-Leahy Scale: Fair Standing balance comment: less than fair dynamically                            Cognition Arousal/Alertness: Awake/alert Behavior During Therapy: WFL for tasks assessed/performed Overall Cognitive Status: Within Functional Limits for tasks assessed                                 General Comments: Follows commands consistently.      Exercises Other Exercises Other Exercises: Standing march x10 bilaterally with RW,  mini squats x10 with RW and SBA. Other Exercises: Standing quiet stance x15 sec,  modified tandem bilaterally x10 sec with UE unilateral assist for R foot forward on one occasion. Other Exercises: Seated march x20, seated leanbacks x10 with VC's for breathing technique, seated leanbacks with trunk rotation with VC's for breathing and coordination x10. Other Exercises: Sit to stand x5 with use of RW and VC's for controlled descent and hand placement.    General  Comments        Pertinent Vitals/Pain Pain Assessment: No/denies pain    Home Living                      Prior Function            PT Goals (current goals can now be found in the care plan section) Acute Rehab PT Goals Patient Stated Goal: to go home PT Goal Formulation: With patient/family Time For Goal Achievement: 10/10/18 Potential to Achieve Goals: Good Progress towards PT goals: Progressing toward goals    Frequency    Min 2X/week      PT Plan Current plan remains appropriate    Co-evaluation              AM-PAC PT "6 Clicks" Daily Activity  Outcome Measure  Difficulty turning over in bed (including adjusting bedclothes, sheets and blankets)?: A Little Difficulty moving from lying on back to sitting on the side of the bed? : A Little Difficulty sitting down on and standing up from a chair with arms (e.g., wheelchair, bedside commode, etc,.)?: A Little Help needed moving to and from a bed to chair (including a wheelchair)?: A Little Help needed walking in hospital room?: A Little Help needed climbing 3-5 steps with a railing? : A Little 6 Click Score: 18    End of Session Equipment Utilized During Treatment: Gait belt Activity Tolerance: Patient tolerated treatment well;Patient limited by fatigue Patient left: in bed;with call bell/phone within reach(pt was permitted to get up alone to BR, independent according to nursing.)   PT Visit Diagnosis: Unsteadiness on feet (R26.81);History of falling (Z91.81);Other (comment);Difficulty in walking, not elsewhere classified (R26.2)     Time: 6712-4580 PT Time Calculation (min) (ACUTE ONLY): 24 min  Charges:  $Therapeutic Exercise: 23-37 mins                     Roxanne Gates, PT, DPT    Roxanne Gates 09/27/2018, 12:29 PM

## 2018-09-27 NOTE — Care Management (Signed)
Continuing with IV lasix today per Dr. Ubaldo Glassing.  Will convert to PO lasix this evening and possible D/C tomorrow with home health.  Referral made with [facility] yesterday.  Patient currently on 1L O2.  Chronic O2 at home.

## 2018-09-27 NOTE — Progress Notes (Signed)
Patient Name: Megan Bolton Date of Encounter: 09/27/2018  Hospital Problem List     Active Problems:   Acute on chronic respiratory failure with hypoxia [facility])    Patient Profile     77 year old female with oxygen dependent COPD, diabetes and history of failure with preserved LV function with ejection fraction of 60 to 65% by echocardiogram in May 2019 with grade 1 diastolic dysfunction, was admitted with increasing shortness of breath and noted to have mild volume overload on chest x-ray.  Has diuresed approximately 1 L.  She states she feels better but not back to baseline.  She complains of constipation.  She was on OxyContin.  She is on irbesartan and carvedilol as an outpatient. Subjective   Somewhat less shortness of breath but constipated.  Inpatient Medications    . aspirin EC  325 mg Oral Daily  . atorvastatin  40 mg Oral q1800  . carvedilol  25 mg Oral BID  . clonazePAM  0.5 mg Oral QHS  . enoxaparin (LOVENOX) injection  40 mg Subcutaneous Q24H  . furosemide  20 mg Intravenous BID  . gabapentin  800 mg Oral TID  . insulin aspart  0-5 Units Subcutaneous QHS  . insulin aspart  0-9 Units Subcutaneous TID WC  . irbesartan  150 mg Oral Daily  . mometasone-formoterol  2 puff Inhalation BID  . montelukast  10 mg Oral QHS  . oxyCODONE  10 mg Oral BID  . pantoprazole  40 mg Oral Daily  . potassium chloride  20 mEq Oral BID  . tiotropium  18 mcg Inhalation Daily    Vital Signs    Vitals:   09/26/18 0728 09/26/18 1550 09/26/18 1909 09/27/18 0426  BP: (!) 156/73 137/79 (!) 146/89 (!) 95/52  Pulse: 65 (!) 58 69 (!) 55  Resp: 18 18 18 18   Temp: 98.4 F (36.9 C) 98.3 F (36.8 C) 97.8 F (36.6 C) 98.2 F (36.8 C)  TempSrc: Oral Oral Oral   SpO2: 97% 96% 94% 95%  Weight:    98.1 kg  Height:        Intake/Output Summary (Last 24 hours) at 09/27/2018 0751 Last data filed at 09/27/2018 0500 Gross per 24 hour  Intake 480 ml  Output 1500 ml  Net -1020 ml   Filed  Weights   09/25/18 2121 09/26/18 0502 09/27/18 0426  Weight: 98.7 kg 98.3 kg 98.1 kg    Physical Exam    GEN: Well nourished, well developed, in no acute distress.  HEENT: normal.  Neck: Supple, no JVD, carotid bruits, or masses. Cardiac: RRR, no murmurs, rubs, or gallops. No clubbing, cyanosis, edema.  Radials/DP/PT 2+ and equal bilaterally.  Respiratory:  Respirations regular and unlabored, clear to auscultation bilaterally. GI: Soft, nontender, nondistended, BS + x 4. MS: no deformity or atrophy. Skin: warm and dry, no rash. Neuro:  Strength and sensation are intact. Psych: Normal affect.  Labs    CBC Recent Labs    09/25/18 1220 09/26/18 0442  WBC 9.3 8.0  HGB 11.2* 10.5*  HCT 34.7* 32.3*  MCV 86.4 84.7  PLT 174 638   Basic Metabolic Panel Recent Labs    09/26/18 0442 09/27/18 0540  NA 141 141  K 3.7 4.3  CL 97* 96*  CO2 36* 34*  GLUCOSE 99 133*  BUN 26* 26*  CREATININE 1.03* 1.16*  CALCIUM 8.8* 8.9   Liver Function Tests Recent Labs    09/25/18 1220  AST 27  ALT 40  ALKPHOS 62  BILITOT 1.1  PROT 7.0  ALBUMIN 4.0   Recent Labs    09/25/18 1220  LIPASE 35   Cardiac Enzymes Recent Labs    09/25/18 1220  TROPONINI 0.03*   BNP Recent Labs    09/25/18 1330  BNP 531.0*   D-Dimer No results for input(s): DDIMER in the last 72 hours. Hemoglobin A1C Recent Labs    09/26/18 0442  HGBA1C 6.4*   Fasting Lipid Panel Recent Labs    09/26/18 0442  CHOL 156  HDL 55  LDLCALC 89  TRIG 59  CHOLHDL 2.8   Thyroid Function Tests No results for input(s): TSH, T4TOTAL, T3FREE, THYROIDAB in the last 72 hours.  Invalid input(s): FREET3  Telemetry    Normal sinus rhythm  ECG    Normal sinus rhythm with no ischemia.  Radiology    Dg Chest 2 View  Result Date: 09/25/2018 CLINICAL DATA:  Difficulty breathing. Increased over the past 2 days. EXAM: CHEST - 2 VIEW COMPARISON:  CT chest 05/24/2018 FINDINGS: There is bilateral interstitial  thickening more prominent at the lung bases. There is no pleural effusion or pneumothorax. There is stable cardiomegaly. There is thoracic aortic atherosclerosis. The osseous structures are unremarkable. IMPRESSION: 1. Findings most consistent with CHF. Electronically Signed   By: Kathreen Devoid   On: 09/25/2018 13:42   Ct Head Wo Contrast  Result Date: 09/25/2018 CLINICAL DATA:  Confusion.  Recent fall. EXAM: CT HEAD WITHOUT CONTRAST TECHNIQUE: Contiguous axial images were obtained from the base of the skull through the vertex without intravenous contrast. COMPARISON:  10/01/2008 head CT. FINDINGS: Brain: No evidence of parenchymal hemorrhage or extra-axial fluid collection. No mass lesion, mass effect, or midline shift. No CT evidence of acute infarction. Nonspecific mild subcortical and periventricular white matter hypodensity, most in keeping with chronic small vessel ischemic change. Generalized cerebral volume loss. No ventriculomegaly. Vascular: No acute abnormality. Skull: No evidence of calvarial fracture. Sinuses/Orbits: No fluid levels. Mucoperiosteal thickening in the bilateral ethmoidal air cells with partial opacification. Other:  The mastoid air cells are unopacified. IMPRESSION: 1. No evidence of acute intracranial abnormality. No evidence of calvarial fracture. 2. Generalized cerebral volume loss and mild chronic small vessel ischemic changes in the cerebral white matter. 3. Mild chronic appearing paranasal sinusitis. Electronically Signed   By: Ilona Sorrel M.D.   On: 09/25/2018 18:03    Assessment & Plan    Congestive heart failure-has diuresed approximately 1 L.  We will continue to use IV Lasix today at 20 mg twice daily.  Will check BNP.  Retinae and is bumped slightly.  Likely is nearing her dry weight.  Would ambulate letter today and converted back to p.o. Lasix.  COPD-remain on oxygen per nasal cannula  Constipation-at her request will increase MiraLAX dosing.  Signed, Javier Docker Jakell Trusty MD 09/27/2018, 7:51 AM  Pager: [phone]

## 2018-09-27 NOTE — Progress Notes (Signed)
Initial Nutrition Assessment  DOCUMENTATION CODES:   Obesity unspecified  INTERVENTION:   Premier Protein BID, each supplement provides 160 kcal and 30 grams of protein.   MVI daily   Liberalize diet   NUTRITION DIAGNOSIS:   Inadequate oral intake related to acute illness as evidenced by meal completion < 25%.  GOAL:   Patient will meet greater than or equal to 90% of their needs  MONITOR:   PO intake, Supplement acceptance, Labs, Weight trends, Skin, I & O's  REASON FOR ASSESSMENT:   Malnutrition Screening Tool    ASSESSMENT:   77 y.o. female with a known history of HTN, T2DM, HLD, COPD, and chronic diastolic heart failure who presented to the ED with progressively worsening shortness of breath and lower extremity edema over the last three days.    Met with pt in room today. Pt reports poor appetite and oral intake for the past 3-4 days. Pt currently eating <25% of meals in the hospital. Per chart, pt is weight stable pta. RD educated pt today regarding a low sodium diet as well as the importance of adequate protein needed to preserve lean muscle. Pt is willing to drink supplements as long as they are low in sugar. RD will order Premier Protein but pt can also be provided with Glucerna if she prefers. RD will liberalize pt's diet as a heart healthy/carb modified diet is very restrictive and pt not eating enough to exceed nutrient limits at baseline. Pt also noted to have borderline iron deficiency, would recommend supplementation after discharge.   Medications reviewed and include: aspirin, lovenox, lasix, insulin, oxycodone, protonix, KCl, miralax   Labs reviewed: Cl 96(L), BUN 26(H), creat 1.16(H) BNP- 421(H) Iron 30, TIBC 368, ferritin 15, folate 32- 9/30 B12- 453- 9/30 cbgs- 99, 133 x 24 hrs AIC 6.4(H)- 9/30  NUTRITION - FOCUSED PHYSICAL EXAM:    Most Recent Value  Orbital Region  No depletion  Upper Arm Region  No depletion  Thoracic and Lumbar Region  No  depletion  Buccal Region  No depletion  Temple Region  No depletion  Clavicle Bone Region  No depletion  Clavicle and Acromion Bone Region  No depletion  Scapular Bone Region  No depletion  Dorsal Hand  No depletion  Patellar Region  No depletion  Anterior Thigh Region  No depletion  Posterior Calf Region  No depletion  Edema (RD Assessment)  Moderate [BLE]  Hair  Reviewed  Eyes  Reviewed  Mouth  Reviewed  Skin  Reviewed  Nails  Reviewed     Diet Order:   Diet Order            Diet 2 gram sodium Room service appropriate? Yes; Fluid consistency: Thin  Diet effective now             EDUCATION NEEDS:   Education needs have been addressed  Skin:  Skin Assessment: Reviewed RN Assessment  Last BM:  9/30  Height:   Ht Readings from Last 1 Encounters:  09/25/18 _0  (1.651 m)    Weight:   Wt Readings from Last 1 Encounters:  09/27/18 98.1 kg    Ideal Body Weight:  56.8 kg  BMI:  Body mass index is 35.98 kg/m.  Estimated Nutritional Needs:   Kcal:  1700-2000kcal/day   Protein:  98-108g/day   Fluid:  >1.7L/day or per MD  Koleen Distance MS, RD, LDN Pager #- [phone] Office#- [phone] After Hours Pager: [phone]

## 2018-09-27 NOTE — Progress Notes (Signed)
[facility] NAME: Megan Bolton    MR#:  [id number]  DATE OF BIRTH:  May 04, 1941  SUBJECTIVE:  CHIEF COMPLAINT:   Chief Complaint  Patient presents with  . Shortness of Breath   Came with worsening shortness of breath.  Noted to have acute CHF.  Having good diuresis in response to the therapy and feeling slightly better. Feels having generalized weakness today.  But less swelling.  REVIEW OF SYSTEMS:  CONSTITUTIONAL: No fever, have fatigue or weakness.  EYES: No blurred or double vision.  EARS, NOSE, AND THROAT: No tinnitus or ear pain.  RESPIRATORY: No cough, had shortness of breath, no wheezing or hemoptysis.  CARDIOVASCULAR: No chest pain, orthopnea, edema.  GASTROINTESTINAL: No nausea, vomiting, diarrhea or abdominal pain.  GENITOURINARY: No dysuria, hematuria.  ENDOCRINE: No polyuria, nocturia,  HEMATOLOGY: No anemia, easy bruising or bleeding SKIN: No rash or lesion. MUSCULOSKELETAL: No joint pain or arthritis.   NEUROLOGIC: No tingling, numbness, weakness.  PSYCHIATRY: No anxiety or depression.   ROS  DRUG ALLERGIES:   Allergies  Allergen Reactions  . Nitrofurantoin Anaphylaxis    VITALS:  Blood pressure (!) 95/52, pulse (!) 55, temperature 98.2 F (36.8 C), resp. rate 18, height 5\' 5"  (1.651 m), weight 98.1 kg, SpO2 95 %.  PHYSICAL EXAMINATION:  GENERAL:  77 y.o.-year-old patient lying in the bed with no acute distress.  EYES: Pupils equal, round, reactive to light and accommodation. No scleral icterus. Extraocular muscles intact.  HEENT: Head atraumatic, normocephalic. Oropharynx and nasopharynx clear.  NECK:  Supple, no jugular venous distention. No thyroid enlargement, no tenderness.  LUNGS: Normal breath sounds bilaterally, no wheezing, b/l crepitation. No use of accessory muscles of respiration.  CARDIOVASCULAR: S1, S2 normal. No murmurs, rubs, or gallops.  ABDOMEN: Soft, nontender, nondistended. Bowel  sounds present. No organomegaly or mass.  EXTREMITIES: No pedal edema, cyanosis, or clubbing.  NEUROLOGIC: Cranial nerves II through XII are intact. Muscle strength 4/5 in all extremities. Sensation intact. Gait not checked.  PSYCHIATRIC: The patient is alert and oriented x 3.  SKIN: No obvious rash, lesion, or ulcer.   Physical Exam LABORATORY PANEL:   CBC Recent Labs  Lab 09/26/18 0442  WBC 8.0  HGB 10.5*  HCT 32.3*  PLT 171   ------------------------------------------------------------------------------------------------------------------  Chemistries  Recent Labs  Lab 09/25/18 1220  09/27/18 0540  NA 139   < > 141  K 4.7   < > 4.3  CL 97*   < > 96*  CO2 33*   < > 34*  GLUCOSE 220*   < > 133*  BUN 34*   < > 26*  CREATININE 1.05*   < > 1.16*  CALCIUM 8.8*   < > 8.9  AST 27  --   --   ALT 40  --   --   ALKPHOS 62  --   --   BILITOT 1.1  --   --    < > = values in this interval not displayed.   ------------------------------------------------------------------------------------------------------------------  Cardiac Enzymes Recent Labs  Lab 09/25/18 1220  TROPONINI 0.03*   ------------------------------------------------------------------------------------------------------------------  RADIOLOGY:  Ct Head Wo Contrast  Result Date: 09/25/2018 CLINICAL DATA:  Confusion.  Recent fall. EXAM: CT HEAD WITHOUT CONTRAST TECHNIQUE: Contiguous axial images were obtained from the base of the skull through the vertex without intravenous contrast. COMPARISON:  10/01/2008 head CT. FINDINGS: Brain: No evidence of parenchymal hemorrhage or extra-axial fluid collection. No mass lesion, mass effect,  or midline shift. No CT evidence of acute infarction. Nonspecific mild subcortical and periventricular white matter hypodensity, most in keeping with chronic small vessel ischemic change. Generalized cerebral volume loss. No ventriculomegaly. Vascular: No acute abnormality. Skull: No  evidence of calvarial fracture. Sinuses/Orbits: No fluid levels. Mucoperiosteal thickening in the bilateral ethmoidal air cells with partial opacification. Other:  The mastoid air cells are unopacified. IMPRESSION: 1. No evidence of acute intracranial abnormality. No evidence of calvarial fracture. 2. Generalized cerebral volume loss and mild chronic small vessel ischemic changes in the cerebral white matter. 3. Mild chronic appearing paranasal sinusitis. Electronically Signed   By: Ilona Sorrel M.D.   On: 09/25/2018 18:03    ASSESSMENT AND PLAN:   Active Problems:   Acute on chronic respiratory failure with hypoxia (HCC)  Acute on chronic hypoxic respiratory failure secondary to acute exacerbation of chronic diastolic heart failure- BNP elevated and CXR findings consistent with CHF. Last ECHO 04/2018 with normal EF and G1DD. On 2L O2 at home. Follows with Dr. Clayborn Bigness as an outpatient. - Lasix 20mg  IV bid - cardiology consult appreciated. - continue coreg - cardiac monitoring - daily weights - strict I/O -Change to oral Lasix now.  Intermittent AMS- resolved on my exam. May have been related to low BP and hypoxia. No focal deficits to suggest stroke. -No acute changes on CT head, as patient had recent fall -She is completely alert and oriented now.  COPD- stable, no wheezing to suggest acute exacerbation. - continue home inhalers  Hypertension- had 1 low BP to 79/60 in the ED, but otherwise has been hypertensive. -Now blood pressure improved, restarted on Coreg and is irbesartan.  Type 2 diabetes- on metformin at home. Glucose 220 in the ED. - sensitive SSI - check a1c-6.4.  Hyperlipidemia- not on a statin. - start lipitor 40mg  daily - checked lipid panel-LDL 89 HDL 55.  Normocytic anemia- Hgb at baseline - check anemia panel-iron and TIBC appeared to be normal.  Vitamin B12 is normal.  Recent H. Pylori infection- just finished treatment yesterday. Epigastric abdominal  pain has resolved - monitor  Chronic pain- stable - continue home oxycontin, xtampza, and gabapentin.  Mild renal insufficiency This could be due to IV Lasix dose, changed to oral and monitor till tomorrow.  All the records are reviewed and case discussed with Care Management/Social Workerr. Management plans discussed with the patient, family and they are in agreement.  CODE STATUS: Full code.  TOTAL TIME TAKING CARE OF THIS PATIENT: 35 minutes.    POSSIBLE D/C IN 1-2 DAYS, DEPENDING ON CLINICAL CONDITION.   Vaughan Basta M.D on 09/27/2018   Between 7am to 6pm - Pager - [phone]  After 6pm go to [email or web address] - password [email or web address] [facility]  Office  [phone]  CC: Primary care physician; Baxter Hire, MD  Note: This dictation was prepared with Dragon dictation along with smaller phrase technology. Any transcriptional errors that result from this process are unintentional.

## 2018-09-27 NOTE — Progress Notes (Signed)
Family Meeting Note  Advance Directive:yes  Today a meeting took place with the Patient.   The following clinical team members were present during this meeting:MD  The following were discussed:Patient's diagnosis: , Patient's progosis: Unable to determine and Goals for treatment: Full Code  Additional follow-up to be provided: Cardiology.  Time spent during discussion:20 minutes  Vaughan Basta, MD

## 2018-09-28 LAB — GLUCOSE, CAPILLARY
GLUCOSE-CAPILLARY: 115 mg/dL — AB (ref 70–99)
GLUCOSE-CAPILLARY: 228 mg/dL — AB (ref 70–99)

## 2018-09-28 LAB — BASIC METABOLIC PANEL
Anion gap: 8 (ref 5–15)
BUN: 23 mg/dL (ref 8–23)
CHLORIDE: 99 mmol/L (ref 98–111)
CO2: 33 mmol/L — ABNORMAL HIGH (ref 22–32)
Calcium: 8.9 mg/dL (ref 8.9–10.3)
Creatinine, Ser: 0.87 mg/dL (ref 0.44–1.00)
GFR calc non Af Amer: >60 mL/min (ref >60)
Glucose, Bld: 115 mg/dL — ABNORMAL HIGH (ref 70–99)
Potassium: 4.3 mmol/L (ref 3.5–5.1)
Sodium: 140 mmol/L (ref 135–145)

## 2018-09-28 MED ORDER — FUROSEMIDE 20 MG PO TABS: 20.0000 mg | ORAL_TABLET | Freq: Every day | ORAL | 0 refills | Status: DC

## 2018-09-28 MED ORDER — ATORVASTATIN CALCIUM 40 MG PO TABS: 40.0000 mg | ORAL_TABLET | Freq: Every day | ORAL | 0 refills | Status: DC

## 2018-09-28 MED ORDER — CARVEDILOL 12.5 MG PO TABS: 12.5000 mg | ORAL_TABLET | Freq: Two times a day (BID) | ORAL | 0 refills | Status: AC

## 2018-09-28 NOTE — Progress Notes (Signed)
Patient Name: Megan Bolton Date of Encounter: 09/28/2018  Hospital Problem List     Active Problems:   Acute on chronic respiratory failure with hypoxia [facility])    Patient Profile     77 year old female with history of COPD admitted with pulmonary edema.  Improved with diuresis.  Subjective   Feels better.  Inpatient Medications    . aspirin EC  325 mg Oral Daily  . atorvastatin  40 mg Oral q1800  . carvedilol  12.5 mg Oral BID  . clonazePAM  0.5 mg Oral QHS  . enoxaparin (LOVENOX) injection  40 mg Subcutaneous Q24H  . furosemide  20 mg Oral Daily  . gabapentin  800 mg Oral TID  . insulin aspart  0-5 Units Subcutaneous QHS  . insulin aspart  0-9 Units Subcutaneous TID WC  . irbesartan  150 mg Oral Daily  . mometasone-formoterol  2 puff Inhalation BID  . montelukast  10 mg Oral QHS  . multivitamin with minerals  1 tablet Oral Daily  . oxyCODONE  10 mg Oral BID  . pantoprazole  40 mg Oral Daily  . potassium chloride  20 mEq Oral BID  . protein supplement shake  11 oz Oral BID BM  . tiotropium  18 mcg Inhalation Daily    Vital Signs    Vitals:   09/27/18 1926 09/27/18 2112 09/28/18 0329 09/28/18 0759  BP: (!) 153/97 135/65 (!) 142/76 (!) 143/83  Pulse: 61 (!) 59 67 66  Resp: 18  18 16   Temp: 98.3 F (36.8 C)  98.4 F (36.9 C)   TempSrc: Oral  Oral   SpO2: 99%  94% 94%  Weight:   96.6 kg   Height:        Intake/Output Summary (Last 24 hours) at 09/28/2018 1201 Last data filed at 09/28/2018 0700 Gross per 24 hour  Intake 0 ml  Output 300 ml  Net -300 ml   Filed Weights   09/26/18 0502 09/27/18 0426 09/28/18 0329  Weight: 98.3 kg 98.1 kg 96.6 kg    Physical Exam    GEN: Well nourished, well developed, in no acute distress.  HEENT: normal.  Neck: Supple, no JVD, carotid bruits, or masses. Cardiac: RRR, no murmurs, rubs, or gallops. No clubbing, cyanosis, edema.  Radials/DP/PT 2+ and equal bilaterally.  Respiratory:  Respirations regular and  unlabored, clear to auscultation bilaterally. GI: Soft, nontender, nondistended, BS + x 4. MS: no deformity or atrophy. Skin: warm and dry, no rash. Neuro:  Strength and sensation are intact. Psych: Normal affect.  Labs    CBC Recent Labs    09/25/18 1220 09/26/18 0442  WBC 9.3 8.0  HGB 11.2* 10.5*  HCT 34.7* 32.3*  MCV 86.4 84.7  PLT 174 620   Basic Metabolic Panel Recent Labs    09/27/18 0540 09/28/18 0628  NA 141 140  K 4.3 4.3  CL 96* 99  CO2 34* 33*  GLUCOSE 133* 115*  BUN 26* 23  CREATININE 1.16* 0.87  CALCIUM 8.9 8.9   Liver Function Tests Recent Labs    09/25/18 1220  AST 27  ALT 40  ALKPHOS 62  BILITOT 1.1  PROT 7.0  ALBUMIN 4.0   Recent Labs    09/25/18 1220  LIPASE 35   Cardiac Enzymes Recent Labs    09/25/18 1220  TROPONINI 0.03*   BNP Recent Labs    09/25/18 1330 09/27/18 0540  BNP 531.0* 421.0*   D-Dimer No results for input(s): DDIMER in  the last 72 hours. Hemoglobin A1C Recent Labs    09/26/18 0442  HGBA1C 6.4*   Fasting Lipid Panel Recent Labs    09/26/18 0442  CHOL 156  HDL 55  LDLCALC 89  TRIG 59  CHOLHDL 2.8   Thyroid Function Tests No results for input(s): TSH, T4TOTAL, T3FREE, THYROIDAB in the last 72 hours.  Invalid input(s): FREET3  Telemetry    Normal sinus rhythm  ECG    Normal sinus rhythm with no ischemia  Radiology    Dg Chest 2 View  Result Date: 09/25/2018 CLINICAL DATA:  Difficulty breathing. Increased over the past 2 days. EXAM: CHEST - 2 VIEW COMPARISON:  CT chest 05/24/2018 FINDINGS: There is bilateral interstitial thickening more prominent at the lung bases. There is no pleural effusion or pneumothorax. There is stable cardiomegaly. There is thoracic aortic atherosclerosis. The osseous structures are unremarkable. IMPRESSION: 1. Findings most consistent with CHF. Electronically Signed   By: Kathreen Devoid   On: 09/25/2018 13:42   Ct Head Wo Contrast  Result Date: 09/25/2018 CLINICAL  DATA:  Confusion.  Recent fall. EXAM: CT HEAD WITHOUT CONTRAST TECHNIQUE: Contiguous axial images were obtained from the base of the skull through the vertex without intravenous contrast. COMPARISON:  10/01/2008 head CT. FINDINGS: Brain: No evidence of parenchymal hemorrhage or extra-axial fluid collection. No mass lesion, mass effect, or midline shift. No CT evidence of acute infarction. Nonspecific mild subcortical and periventricular white matter hypodensity, most in keeping with chronic small vessel ischemic change. Generalized cerebral volume loss. No ventriculomegaly. Vascular: No acute abnormality. Skull: No evidence of calvarial fracture. Sinuses/Orbits: No fluid levels. Mucoperiosteal thickening in the bilateral ethmoidal air cells with partial opacification. Other:  The mastoid air cells are unopacified. IMPRESSION: 1. No evidence of acute intracranial abnormality. No evidence of calvarial fracture. 2. Generalized cerebral volume loss and mild chronic small vessel ischemic changes in the cerebral white matter. 3. Mild chronic appearing paranasal sinusitis. Electronically Signed   By: Ilona Sorrel M.D.   On: 09/25/2018 18:03    Assessment & Plan    Doing much better.  Less short of breath.  BNP improved to 421 from 531.  Renal function improved.  Less short of breath.  Okay to discharge on current regimen including furosemide 20 mg daily and carvedilol 12.5 mg twice daily as well as atorvastatin 40 mg daily and enteric-coated aspirin 325 mg daily.  Follow-up with Dr. Clayborn Bigness in 2-week  Signed, Javier Docker. Fath MD 09/28/2018, 12:01 PM  Pager: [phone]

## 2018-09-28 NOTE — Progress Notes (Signed)
Provided patient with "Living Better with Heart Failure" packet. Briefly reviewed definition of heart failure and signs and symptoms of an exacerbation. Reviewed importance of and reason behind checking weight daily in the AM, after using the bathroom, but before getting dressed. Discussed when to call the Dr= weight gain of >2lb overnight of 5lb in a week,  Discussed yellow zone= call MD: weight gain of >2lb overnight of 5lb in a week, increased swelling, increased SOB when lying down, chest discomfort, dizziness, increased fatigue Red Zone= call 911: struggle to breath, fainting or near fainting, significant chest pain Fluid restriction <2L/day  Explained role of HF clinic- appointment time Oct 7 @ 0900 Pt will call if she needs to move appointment  Explained [facility] program Pt states she does weigh herself everyday and did notice a weight gain, but did not think to call anyone. Encouraged her to call cardiologist or Otila Kluver with any symptom in the yellow zone so they can make any needed adjustments in medications  Otila Starn D Thi Sisemore, Pharm.D, BCPS Clinical Pharmacist

## 2018-09-28 NOTE — Progress Notes (Signed)
Megan Bolton to be D/C'd Home with Home Health per MD order.  Discussed prescriptions and follow up appointments with the patient. Prescriptions given to patient, medication list explained in detail. Pt verbalized understanding. Daughter will be transporting pt home.  Allergies as of 09/28/2018      Reactions   Nitrofurantoin Anaphylaxis      Medication List    TAKE these medications   ACCU-CHEK AVIVA PLUS test strip Generic drug:  glucose blood 1 each by Other route 2 (two) times daily.   ACCU-CHEK AVIVA PLUS w/Device Kit USE AS DIRECTED   ACCU-CHEK SOFTCLIX LANCETS lancets 1 each by Other route 2 (two) times daily.   albuterol 108 (90 Base) MCG/ACT inhaler Commonly known as:  PROVENTIL HFA;VENTOLIN HFA Inhale 2 puffs into the lungs every 6 (six) hours as needed for wheezing or shortness of breath.   albuterol (2.5 MG/3ML) 0.083% nebulizer solution Commonly known as:  PROVENTIL Take 3 mLs (2.5 mg total) by nebulization every 6 (six) hours as needed for wheezing or shortness of breath.   aspirin EC 325 MG tablet Take 325 mg by mouth daily.   atorvastatin 40 MG tablet Commonly known as:  LIPITOR Take 1 tablet (40 mg total) by mouth daily at 6 PM.   carvedilol 12.5 MG tablet Commonly known as:  COREG Take 1 tablet (12.5 mg total) by mouth 2 (two) times daily. What changed:    medication strength  how much to take   clonazePAM 0.5 MG tablet Commonly known as:  KLONOPIN Take 0.5 mg by mouth at bedtime.   conjugated estrogens vaginal cream Commonly known as:  PREMARIN Apply 0.'5mg'$  (pea-sized amount)  just inside the vaginal introitus with a finger-tip on  Monday, Wednesday and Friday nights.   estradiol 0.1 MG/GM vaginal cream Commonly known as:  ESTRACE Apply 0.'5mg'$  (pea-sized amount)  just inside the vaginal introitus with a finger-tip on Monday, Wednesday and Friday nights.   Fluticasone-Salmeterol 250-50 MCG/DOSE Aepb Commonly known as:  ADVAIR Inhale 1 puff  into the lungs 2 (two) times daily.   furosemide 20 MG tablet Commonly known as:  LASIX Take 1 tablet (20 mg total) by mouth daily. Start taking on:  09/29/2018 What changed:  how much to take   gabapentin 800 MG tablet Commonly known as:  NEURONTIN Take 1 tablet (800 mg total) by mouth 3 (three) times daily.   irbesartan-hydrochlorothiazide 300-12.5 MG tablet Commonly known as:  AVALIDE Take 1 tablet by mouth daily.   metFORMIN 500 MG tablet Commonly known as:  GLUCOPHAGE TAKE ONE TABLET BY MOUTH EVERY MORNING WITH BREAKFAST What changed:  See the new instructions.   montelukast 10 MG tablet Commonly known as:  SINGULAIR TAKE ONE TABLET BY MOUTH AT BEDTIME   ondansetron 4 MG disintegrating tablet Commonly known as:  ZOFRAN-ODT Take 4 mg by mouth every 8 (eight) hours as needed for nausea/vomiting.   OXYCONTIN 10 mg 12 hr tablet Generic drug:  oxyCODONE Take 10 mg by mouth 2 (two) times daily.   OXYGEN Inhale 2 L into the lungs continuous.   pantoprazole 40 MG tablet Commonly known as:  PROTONIX Take 40 mg by mouth 2 (two) times daily before a meal.   polyethylene glycol packet Commonly known as:  MIRALAX / GLYCOLAX Take 17 g by mouth daily.   potassium chloride 10 MEQ tablet Commonly known as:  K-DUR Take 1 tablet (10 mEq total) by mouth 2 (two) times daily.   sucralfate 1 g tablet Commonly known  as:  CARAFATE Take 1 tablet by mouth 4 (four) times daily -  before meals and at bedtime.   tiotropium 18 MCG inhalation capsule Commonly known as:  SPIRIVA Place 1 capsule (18 mcg total) into inhaler and inhale daily.   valACYclovir 1000 MG tablet Commonly known as:  VALTREX Take 1 tablet (1,000 mg total) by mouth daily as needed (for fever blisters).       Vitals:   09/28/18 0329 09/28/18 0759  BP: (!) 142/76 (!) 143/83  Pulse: 67 66  Resp: 18 16  Temp: 98.4 F (36.9 C)   SpO2: 94% 94%    Tele box removed and returned. Skin clean, dry and intact  without evidence of skin break down, no evidence of skin tears noted. IV catheter discontinued intact. Site without signs and symptoms of complications. Dressing and pressure applied. Pt denies pain at this time. No complaints noted.  An After Visit Summary was printed and given to the patient. Patient escorted via [place], and D/C home via private auto.  [name]

## 2018-09-28 NOTE — Care Management Important Message (Signed)
Important Message  Patient Details  Name: Megan Bolton MRN: [id number] Date of Birth: 07/12/1941   Medicare Important Message Given:  Yes    Elza Rafter, RN 09/28/2018, 12:20 PM

## 2018-09-28 NOTE — Care Management (Signed)
Discharging today.  Notified Helene Kelp with [facility].  Home Health set up for RN/PT and [facility] vest.

## 2018-09-29 DIAGNOSIS — G8929 Other chronic pain: Secondary | ICD-10-CM | POA: Diagnosis not present

## 2018-09-29 DIAGNOSIS — J449 Chronic obstructive pulmonary disease, unspecified: Secondary | ICD-10-CM | POA: Diagnosis not present

## 2018-09-29 DIAGNOSIS — D649 Anemia, unspecified: Secondary | ICD-10-CM | POA: Diagnosis not present

## 2018-09-29 DIAGNOSIS — J9621 Acute and chronic respiratory failure with hypoxia: Secondary | ICD-10-CM | POA: Diagnosis not present

## 2018-09-29 DIAGNOSIS — E785 Hyperlipidemia, unspecified: Secondary | ICD-10-CM | POA: Diagnosis not present

## 2018-09-29 DIAGNOSIS — E119 Type 2 diabetes mellitus without complications: Secondary | ICD-10-CM | POA: Diagnosis not present

## 2018-09-29 DIAGNOSIS — I5032 Chronic diastolic (congestive) heart failure: Secondary | ICD-10-CM | POA: Diagnosis not present

## 2018-09-29 DIAGNOSIS — Z7951 Long term (current) use of inhaled steroids: Secondary | ICD-10-CM | POA: Diagnosis not present

## 2018-09-29 DIAGNOSIS — I110 Hypertensive heart disease with heart failure: Secondary | ICD-10-CM | POA: Diagnosis not present

## 2018-10-03 ENCOUNTER — Ambulatory Visit: Payer: Medicare HMO | Admitting: Family

## 2018-10-03 DIAGNOSIS — J9621 Acute and chronic respiratory failure with hypoxia: Secondary | ICD-10-CM | POA: Diagnosis not present

## 2018-10-03 DIAGNOSIS — E119 Type 2 diabetes mellitus without complications: Secondary | ICD-10-CM | POA: Diagnosis not present

## 2018-10-03 DIAGNOSIS — Z7951 Long term (current) use of inhaled steroids: Secondary | ICD-10-CM | POA: Diagnosis not present

## 2018-10-03 DIAGNOSIS — I5032 Chronic diastolic (congestive) heart failure: Secondary | ICD-10-CM | POA: Diagnosis not present

## 2018-10-03 DIAGNOSIS — J449 Chronic obstructive pulmonary disease, unspecified: Secondary | ICD-10-CM | POA: Diagnosis not present

## 2018-10-03 DIAGNOSIS — E785 Hyperlipidemia, unspecified: Secondary | ICD-10-CM | POA: Diagnosis not present

## 2018-10-03 DIAGNOSIS — G8929 Other chronic pain: Secondary | ICD-10-CM | POA: Diagnosis not present

## 2018-10-03 DIAGNOSIS — I110 Hypertensive heart disease with heart failure: Secondary | ICD-10-CM | POA: Diagnosis not present

## 2018-10-03 DIAGNOSIS — D649 Anemia, unspecified: Secondary | ICD-10-CM | POA: Diagnosis not present

## 2018-10-03 NOTE — Discharge Summary (Signed)
[facility] NAME: Megan Bolton    MR#:  [id number]  DATE OF BIRTH:  July 07, 1941  DATE OF ADMISSION:  09/25/2018 ADMITTING PHYSICIAN: Sela Hua, MD  DATE OF DISCHARGE: 09/28/2018  2:25 PM  PRIMARY CARE PHYSICIAN: Baxter Hire, MD    ADMISSION DIAGNOSIS:  Delirium [E26.8] Systolic congestive heart failure, unspecified HF chronicity ([place]) [I50.20]  DISCHARGE DIAGNOSIS:  Active Problems:   Acute on chronic respiratory failure with hypoxia ([place])   SECONDARY DIAGNOSIS:   Past Medical History:  Diagnosis Date  . CHF (congestive heart failure) ([place])   . COPD (chronic obstructive pulmonary disease) ([place])   . Diabetes mellitus without complication ([place])   . Diastolic heart failure ([place])   . Hyperlipidemia   . Hypertension     HOSPITAL COURSE:   Acuteon chronichypoxic respiratory failure secondary to acute exacerbation of chronic diastolic heart failure- BNP elevated and CXR findings consistent with CHF. Last ECHO 04/2018 with normal EF and G1DD. On 2L O2 at home.Follows with Dr. Clayborn Bigness as an outpatient. - Lasix 35m IV bid - cardiology consult appreciated. - continue coreg - cardiac monitoring - daily weights - strict I/O -Change to oral Lasix now.  Intermittent AMS- resolved on my exam. May have been related to low BP and hypoxia. No focal deficits to suggest stroke. -No acute changes on CT head, as patient had recent fall -She is completely alert and oriented now.  COPD- stable, no wheezing to suggest acute exacerbation. - continue home inhalers  Hypertension- had 1 low BP to 79/60 in the ED, but otherwise has been hypertensive. -Now blood pressure improved, restarted on Coreg and is irbesartan.  Type 2 diabetes- on metformin at home. Glucose 220 in the ED. - sensitive SSI - check a1c-6.4.  Hyperlipidemia- not on a statin. - start lipitor 447mdaily - checked lipid panel-LDL 89 HDL  55.  Normocytic anemia- Hgb at baseline - check anemia panel-iron and TIBC appeared to be normal.  Vitamin B12 is normal.  Recent H. Pylori infection- just finished treatment yesterday. Epigastric abdominal pain has resolved - monitor  Chronic pain- stable - continue home oxycontin, xtampza, and gabapentin.  Mild renal insufficiency This could be due to IV Lasix dose, changed to oral and monitor till tomorrow.   DISCHARGE CONDITIONS:   Stable.  CONSULTS OBTAINED:  Treatment Team:  FaTeodoro SprayMD  DRUG ALLERGIES:   Allergies  Allergen Reactions  . Nitrofurantoin Anaphylaxis    DISCHARGE MEDICATIONS:   Allergies as of 09/28/2018      Reactions   Nitrofurantoin Anaphylaxis      Medication List    TAKE these medications   ACCU-CHEK AVIVA PLUS test strip Generic drug:  glucose blood 1 each by Other route 2 (two) times daily.   ACCU-CHEK AVIVA PLUS w/Device Kit USE AS DIRECTED   ACCU-CHEK SOFTCLIX LANCETS lancets 1 each by Other route 2 (two) times daily.   albuterol 108 (90 Base) MCG/ACT inhaler Commonly known as:  PROVENTIL HFA;VENTOLIN HFA Inhale 2 puffs into the lungs every 6 (six) hours as needed for wheezing or shortness of breath.   albuterol (2.5 MG/3ML) 0.083% nebulizer solution Commonly known as:  PROVENTIL Take 3 mLs (2.5 mg total) by nebulization every 6 (six) hours as needed for wheezing or shortness of breath.   aspirin EC 325 MG tablet Take 325 mg by mouth daily.   atorvastatin 40 MG tablet Commonly known as:  LIPITOR Take 1  tablet (40 mg total) by mouth daily at 6 PM.   carvedilol 12.5 MG tablet Commonly known as:  COREG Take 1 tablet (12.5 mg total) by mouth 2 (two) times daily. What changed:    medication strength  how much to take   clonazePAM 0.5 MG tablet Commonly known as:  KLONOPIN Take 0.5 mg by mouth at bedtime.   conjugated estrogens vaginal cream Commonly known as:  PREMARIN Apply 0.18m (pea-sized amount)   just inside the vaginal introitus with a finger-tip on  Monday, Wednesday and Friday nights.   estradiol 0.1 MG/GM vaginal cream Commonly known as:  ESTRACE Apply 0.54m(pea-sized amount)  just inside the vaginal introitus with a finger-tip on Monday, Wednesday and Friday nights.   Fluticasone-Salmeterol 250-50 MCG/DOSE Aepb Commonly known as:  ADVAIR Inhale 1 puff into the lungs 2 (two) times daily.   furosemide 20 MG tablet Commonly known as:  LASIX Take 1 tablet (20 mg total) by mouth daily. What changed:  how much to take   gabapentin 800 MG tablet Commonly known as:  NEURONTIN Take 1 tablet (800 mg total) by mouth 3 (three) times daily.   irbesartan-hydrochlorothiazide 300-12.5 MG tablet Commonly known as:  AVALIDE Take 1 tablet by mouth daily.   metFORMIN 500 MG tablet Commonly known as:  GLUCOPHAGE TAKE ONE TABLET BY MOUTH EVERY MORNING WITH BREAKFAST What changed:  See the new instructions.   montelukast 10 MG tablet Commonly known as:  SINGULAIR TAKE ONE TABLET BY MOUTH AT BEDTIME   ondansetron 4 MG disintegrating tablet Commonly known as:  ZOFRAN-ODT Take 4 mg by mouth every 8 (eight) hours as needed for nausea/vomiting.   OXYCONTIN 10 mg 12 hr tablet Generic drug:  oxyCODONE Take 10 mg by mouth 2 (two) times daily.   OXYGEN Inhale 2 L into the lungs continuous.   pantoprazole 40 MG tablet Commonly known as:  PROTONIX Take 40 mg by mouth 2 (two) times daily before a meal.   polyethylene glycol packet Commonly known as:  MIRALAX / GLYCOLAX Take 17 g by mouth daily.   potassium chloride 10 MEQ tablet Commonly known as:  K-DUR Take 1 tablet (10 mEq total) by mouth 2 (two) times daily.   sucralfate 1 g tablet Commonly known as:  CARAFATE Take 1 tablet by mouth 4 (four) times daily -  before meals and at bedtime.   tiotropium 18 MCG inhalation capsule Commonly known as:  SPIRIVA Place 1 capsule (18 mcg total) into inhaler and inhale daily.    valACYclovir 1000 MG tablet Commonly known as:  VALTREX Take 1 tablet (1,000 mg total) by mouth daily as needed (for fever blisters).        DISCHARGE INSTRUCTIONS:   Follow with cardiology clinic.  If you experience worsening of your admission symptoms, develop shortness of breath, life threatening emergency, suicidal or homicidal thoughts you must seek medical attention immediately by calling 911 or calling your MD immediately  if symptoms less severe.  You Must read complete instructions/literature along with all the possible adverse reactions/side effects for all the Medicines you take and that have been prescribed to you. Take any new Medicines after you have completely understood and accept all the possible adverse reactions/side effects.   Please note  You were cared for by a hospitalist during your hospital stay. If you have any questions about your discharge medications or the care you received while you were in the hospital after you are discharged, you can call the unit and  asked to speak with the hospitalist on call if the hospitalist that took care of you is not available. Once you are discharged, your primary care physician will handle any further medical issues. Please note that NO REFILLS for any discharge medications will be authorized once you are discharged, as it is imperative that you return to your primary care physician (or establish a relationship with a primary care physician if you do not have one) for your aftercare needs so that they can reassess your need for medications and monitor your lab values.    Today   CHIEF COMPLAINT:   Chief Complaint  Patient presents with  . Shortness of Breath    HISTORY OF PRESENT ILLNESS:  Megan Bolton  is a 77 y.o. female with a known history of HTN, T2DM, HLD, COPD, and chronic diastolic heart failure who presented to the ED with progressively worsening shortness of breath and lower extremity edema over the last three  days. She has been taking Lasix 74m daily at home, but states that she is not urinating as much as she used to. She typically uses 2L O2 at home, but required 3L O2 this morning for O2 saturations as low as 80. No changes in her diet. She also feels that her abdomen is swollen. She endorses orthopnea and dry cough. No fevers or chills. No chest pain or sputum production.   She was also noted to have intermittent confusion while in the ED. She endorses a fall a couple of days ago, where she landed on her hands and knees. She is not on any blood thinners.  In the ED, she required 4L O2 by [place] for desaturations to the high 80s. She did have some low BPs to 79/60. Labs were significant for BNP 531. CXR was consistent with CHF. She was given Lasix 260mIV x 1. Hospitalists were called for admission.   VITAL SIGNS:  Blood pressure (!) 143/83, pulse 66, temperature 98.4 F (36.9 C), temperature source Oral, resp. rate 16, height 5' 5"  (1.651 m), weight 96.6 kg, SpO2 94 %.  I/O:  No intake or output data in the 24 hours ending 10/03/18 1622  PHYSICAL EXAMINATION:   GENERAL:  89.o.-year-old patient lying in the bed with no acute distress.  EYES: Pupils equal, round, reactive to light and accommodation. No scleral icterus. Extraocular muscles intact.  HEENT: Head atraumatic, normocephalic. Oropharynx and nasopharynx clear.  NECK:  Supple, no jugular venous distention. No thyroid enlargement, no tenderness.  LUNGS: Normal breath sounds bilaterally, no wheezing, b/l crepitation. No use of accessory muscles of respiration.  CARDIOVASCULAR: S1, S2 normal. No murmurs, rubs, or gallops.  ABDOMEN: Soft, nontender, nondistended. Bowel sounds present. No organomegaly or mass.  EXTREMITIES: No pedal edema, cyanosis, or clubbing.  NEUROLOGIC: Cranial nerves II through XII are intact. Muscle strength 4/5 in all extremities. Sensation intact. Gait not checked.  PSYCHIATRIC: The patient is alert and oriented x  3.  SKIN: No obvious rash, lesion, or ulcer.   DATA REVIEW:   CBC No results for input(s): WBC, HGB, HCT, PLT in the last 168 hours.  Chemistries  Recent Labs  Lab 09/28/18 0628  NA 140  K 4.3  CL 99  CO2 33*  GLUCOSE 115*  BUN 23  CREATININE 0.87  CALCIUM 8.9    Cardiac Enzymes No results for input(s): TROPONINI in the last 168 hours.  Microbiology Results  Results for orders placed or performed during the hospital encounter of 04/08/18  Urine Culture  Status: Abnormal   Collection Time: 04/08/18 12:22 PM  Result Value Ref Range Status   Specimen Description   Final    URINE, CLEAN CATCH Performed at [facility], [address] [place], [place] [address]    Special Requests   Final    Normal Performed at [facility], [place], [place] [address]    Culture (A)  Final    >=100,000 COLONIES/mL ESCHERICHIA COLI >=100,000 COLONIES/mL VANCOMYCIN RESISTANT ENTEROCOCCUS    Report Status 04/11/2018 FINAL  Final   Organism ID, Bacteria ESCHERICHIA COLI (A)  Final   Organism ID, Bacteria VANCOMYCIN RESISTANT ENTEROCOCCUS (A)  Final      Susceptibility   Escherichia coli - MIC*    AMPICILLIN >=32 RESISTANT Resistant     CEFAZOLIN 8 SENSITIVE Sensitive     CEFTRIAXONE <=1 SENSITIVE Sensitive     CIPROFLOXACIN >=4 RESISTANT Resistant     GENTAMICIN <=1 SENSITIVE Sensitive     IMIPENEM <=0.25 SENSITIVE Sensitive     NITROFURANTOIN <=16 SENSITIVE Sensitive     TRIMETH/SULFA >=320 RESISTANT Resistant     AMPICILLIN/SULBACTAM 16 INTERMEDIATE Intermediate     PIP/TAZO 8 SENSITIVE Sensitive     Extended ESBL NEGATIVE Sensitive     * >=100,000 COLONIES/mL ESCHERICHIA COLI   Vancomycin resistant enterococcus - MIC*    AMPICILLIN >=32 RESISTANT Resistant     LEVOFLOXACIN >=8 RESISTANT Resistant     NITROFURANTOIN <=16 SENSITIVE Sensitive     VANCOMYCIN >=32 RESISTANT Resistant     * >=100,000 COLONIES/mL VANCOMYCIN RESISTANT  ENTEROCOCCUS    RADIOLOGY:  No results found.  EKG:   Orders placed or performed during the hospital encounter of 09/25/18  . ED EKG  . ED EKG  . EKG 12-Lead  . EKG 12-Lead      Management plans discussed with the patient, family and they are in agreement.  CODE STATUS:  Code Status History    Date Active Date Inactive Code Status Order ID Comments User Context   09/25/2018 2129 09/28/2018 1737 Full Code [id number]  Sela Hua, MD Inpatient   05/25/2018 0527 05/25/2018 1825 Full Code [id number]  Arta Silence, MD Inpatient   12/19/2017 0514 12/19/2017 1813 Full Code [id number]  Saundra Shelling, MD Inpatient   08/30/2017 1021 08/31/2017 1500 Full Code [id number]  Bettey Costa, MD Inpatient   04/18/2016 0142 04/19/2016 1732 Full Code [id number]  Lance Coon, MD ED   02/28/2016 0540 03/01/2016 1739 Full Code [id number]  Saundra Shelling, MD Inpatient    Advance Directive Documentation     Most Recent Value  Type of Advance Directive  Healthcare Power of Attorney  Pre-existing out of facility DNR order (yellow form or pink MOST form)  -  "MOST" Form in Place?  -      TOTAL TIME TAKING CARE OF THIS PATIENT: 35 minutes.    Vaughan Basta M.D on 10/03/2018 at 4:22 PM  Between 7am to 6pm - Pager - [phone]  After 6pm go to [email or web address] - password [email or web address] [facility]  Office  [phone]  CC: Primary care physician; Baxter Hire, MD   Note: This dictation was prepared with Dragon dictation along with smaller phrase technology. Any transcriptional errors that result from this process are unintentional.

## 2018-10-05 DIAGNOSIS — E119 Type 2 diabetes mellitus without complications: Secondary | ICD-10-CM | POA: Diagnosis not present

## 2018-10-05 DIAGNOSIS — I110 Hypertensive heart disease with heart failure: Secondary | ICD-10-CM | POA: Diagnosis not present

## 2018-10-05 DIAGNOSIS — Z7951 Long term (current) use of inhaled steroids: Secondary | ICD-10-CM | POA: Diagnosis not present

## 2018-10-05 DIAGNOSIS — J449 Chronic obstructive pulmonary disease, unspecified: Secondary | ICD-10-CM | POA: Diagnosis not present

## 2018-10-05 DIAGNOSIS — D649 Anemia, unspecified: Secondary | ICD-10-CM | POA: Diagnosis not present

## 2018-10-05 DIAGNOSIS — J9621 Acute and chronic respiratory failure with hypoxia: Secondary | ICD-10-CM | POA: Diagnosis not present

## 2018-10-05 DIAGNOSIS — E785 Hyperlipidemia, unspecified: Secondary | ICD-10-CM | POA: Diagnosis not present

## 2018-10-05 DIAGNOSIS — I5032 Chronic diastolic (congestive) heart failure: Secondary | ICD-10-CM | POA: Diagnosis not present

## 2018-10-05 DIAGNOSIS — G8929 Other chronic pain: Secondary | ICD-10-CM | POA: Diagnosis not present

## 2018-10-07 DIAGNOSIS — Z7951 Long term (current) use of inhaled steroids: Secondary | ICD-10-CM | POA: Diagnosis not present

## 2018-10-07 DIAGNOSIS — E785 Hyperlipidemia, unspecified: Secondary | ICD-10-CM | POA: Diagnosis not present

## 2018-10-07 DIAGNOSIS — I110 Hypertensive heart disease with heart failure: Secondary | ICD-10-CM | POA: Diagnosis not present

## 2018-10-07 DIAGNOSIS — E119 Type 2 diabetes mellitus without complications: Secondary | ICD-10-CM | POA: Diagnosis not present

## 2018-10-07 DIAGNOSIS — D649 Anemia, unspecified: Secondary | ICD-10-CM | POA: Diagnosis not present

## 2018-10-07 DIAGNOSIS — G8929 Other chronic pain: Secondary | ICD-10-CM | POA: Diagnosis not present

## 2018-10-07 DIAGNOSIS — J449 Chronic obstructive pulmonary disease, unspecified: Secondary | ICD-10-CM | POA: Diagnosis not present

## 2018-10-07 DIAGNOSIS — J9621 Acute and chronic respiratory failure with hypoxia: Secondary | ICD-10-CM | POA: Diagnosis not present

## 2018-10-07 DIAGNOSIS — I5032 Chronic diastolic (congestive) heart failure: Secondary | ICD-10-CM | POA: Diagnosis not present

## 2018-10-09 DIAGNOSIS — J449 Chronic obstructive pulmonary disease, unspecified: Secondary | ICD-10-CM | POA: Diagnosis not present

## 2018-10-09 DIAGNOSIS — J438 Other emphysema: Secondary | ICD-10-CM | POA: Diagnosis not present

## 2018-10-10 DIAGNOSIS — J9621 Acute and chronic respiratory failure with hypoxia: Secondary | ICD-10-CM | POA: Diagnosis not present

## 2018-10-10 DIAGNOSIS — G8929 Other chronic pain: Secondary | ICD-10-CM | POA: Diagnosis not present

## 2018-10-10 DIAGNOSIS — E119 Type 2 diabetes mellitus without complications: Secondary | ICD-10-CM | POA: Diagnosis not present

## 2018-10-10 DIAGNOSIS — E785 Hyperlipidemia, unspecified: Secondary | ICD-10-CM | POA: Diagnosis not present

## 2018-10-10 DIAGNOSIS — I5032 Chronic diastolic (congestive) heart failure: Secondary | ICD-10-CM | POA: Diagnosis not present

## 2018-10-10 DIAGNOSIS — Z7951 Long term (current) use of inhaled steroids: Secondary | ICD-10-CM | POA: Diagnosis not present

## 2018-10-10 DIAGNOSIS — D649 Anemia, unspecified: Secondary | ICD-10-CM | POA: Diagnosis not present

## 2018-10-10 DIAGNOSIS — J449 Chronic obstructive pulmonary disease, unspecified: Secondary | ICD-10-CM | POA: Diagnosis not present

## 2018-10-10 DIAGNOSIS — I110 Hypertensive heart disease with heart failure: Secondary | ICD-10-CM | POA: Diagnosis not present

## 2018-10-11 DIAGNOSIS — I5032 Chronic diastolic (congestive) heart failure: Secondary | ICD-10-CM | POA: Diagnosis not present

## 2018-10-11 DIAGNOSIS — E785 Hyperlipidemia, unspecified: Secondary | ICD-10-CM | POA: Diagnosis not present

## 2018-10-11 DIAGNOSIS — J449 Chronic obstructive pulmonary disease, unspecified: Secondary | ICD-10-CM | POA: Diagnosis not present

## 2018-10-11 DIAGNOSIS — E119 Type 2 diabetes mellitus without complications: Secondary | ICD-10-CM | POA: Diagnosis not present

## 2018-10-11 DIAGNOSIS — J9621 Acute and chronic respiratory failure with hypoxia: Secondary | ICD-10-CM | POA: Diagnosis not present

## 2018-10-11 DIAGNOSIS — G8929 Other chronic pain: Secondary | ICD-10-CM | POA: Diagnosis not present

## 2018-10-11 DIAGNOSIS — I110 Hypertensive heart disease with heart failure: Secondary | ICD-10-CM | POA: Diagnosis not present

## 2018-10-11 DIAGNOSIS — Z7951 Long term (current) use of inhaled steroids: Secondary | ICD-10-CM | POA: Diagnosis not present

## 2018-10-11 DIAGNOSIS — D649 Anemia, unspecified: Secondary | ICD-10-CM | POA: Diagnosis not present

## 2018-10-13 DIAGNOSIS — E1122 Type 2 diabetes mellitus with diabetic chronic kidney disease: Secondary | ICD-10-CM | POA: Diagnosis not present

## 2018-10-13 DIAGNOSIS — E875 Hyperkalemia: Secondary | ICD-10-CM | POA: Diagnosis not present

## 2018-10-13 DIAGNOSIS — I10 Essential (primary) hypertension: Secondary | ICD-10-CM | POA: Diagnosis not present

## 2018-10-13 DIAGNOSIS — N183 Chronic kidney disease, stage 3 (moderate): Secondary | ICD-10-CM | POA: Diagnosis not present

## 2018-10-14 DIAGNOSIS — J9621 Acute and chronic respiratory failure with hypoxia: Secondary | ICD-10-CM | POA: Diagnosis not present

## 2018-10-14 DIAGNOSIS — Z7951 Long term (current) use of inhaled steroids: Secondary | ICD-10-CM | POA: Diagnosis not present

## 2018-10-14 DIAGNOSIS — E785 Hyperlipidemia, unspecified: Secondary | ICD-10-CM | POA: Diagnosis not present

## 2018-10-14 DIAGNOSIS — D649 Anemia, unspecified: Secondary | ICD-10-CM | POA: Diagnosis not present

## 2018-10-14 DIAGNOSIS — E119 Type 2 diabetes mellitus without complications: Secondary | ICD-10-CM | POA: Diagnosis not present

## 2018-10-14 DIAGNOSIS — G8929 Other chronic pain: Secondary | ICD-10-CM | POA: Diagnosis not present

## 2018-10-14 DIAGNOSIS — J449 Chronic obstructive pulmonary disease, unspecified: Secondary | ICD-10-CM | POA: Diagnosis not present

## 2018-10-14 DIAGNOSIS — I5032 Chronic diastolic (congestive) heart failure: Secondary | ICD-10-CM | POA: Diagnosis not present

## 2018-10-14 DIAGNOSIS — I110 Hypertensive heart disease with heart failure: Secondary | ICD-10-CM | POA: Diagnosis not present

## 2018-10-18 DIAGNOSIS — I110 Hypertensive heart disease with heart failure: Secondary | ICD-10-CM | POA: Diagnosis not present

## 2018-10-18 DIAGNOSIS — J9621 Acute and chronic respiratory failure with hypoxia: Secondary | ICD-10-CM | POA: Diagnosis not present

## 2018-10-18 DIAGNOSIS — E119 Type 2 diabetes mellitus without complications: Secondary | ICD-10-CM | POA: Diagnosis not present

## 2018-10-18 DIAGNOSIS — I5032 Chronic diastolic (congestive) heart failure: Secondary | ICD-10-CM | POA: Diagnosis not present

## 2018-10-18 DIAGNOSIS — G8929 Other chronic pain: Secondary | ICD-10-CM | POA: Diagnosis not present

## 2018-10-18 DIAGNOSIS — E785 Hyperlipidemia, unspecified: Secondary | ICD-10-CM | POA: Diagnosis not present

## 2018-10-18 DIAGNOSIS — Z7951 Long term (current) use of inhaled steroids: Secondary | ICD-10-CM | POA: Diagnosis not present

## 2018-10-18 DIAGNOSIS — J449 Chronic obstructive pulmonary disease, unspecified: Secondary | ICD-10-CM | POA: Diagnosis not present

## 2018-10-18 DIAGNOSIS — D649 Anemia, unspecified: Secondary | ICD-10-CM | POA: Diagnosis not present

## 2018-10-20 ENCOUNTER — Ambulatory Visit: Payer: Medicare HMO | Admitting: Urology

## 2018-10-20 DIAGNOSIS — E119 Type 2 diabetes mellitus without complications: Secondary | ICD-10-CM | POA: Diagnosis not present

## 2018-10-20 DIAGNOSIS — G8929 Other chronic pain: Secondary | ICD-10-CM | POA: Diagnosis not present

## 2018-10-20 DIAGNOSIS — I110 Hypertensive heart disease with heart failure: Secondary | ICD-10-CM | POA: Diagnosis not present

## 2018-10-20 DIAGNOSIS — J9621 Acute and chronic respiratory failure with hypoxia: Secondary | ICD-10-CM | POA: Diagnosis not present

## 2018-10-20 DIAGNOSIS — E785 Hyperlipidemia, unspecified: Secondary | ICD-10-CM | POA: Diagnosis not present

## 2018-10-20 DIAGNOSIS — I5032 Chronic diastolic (congestive) heart failure: Secondary | ICD-10-CM | POA: Diagnosis not present

## 2018-10-20 DIAGNOSIS — D649 Anemia, unspecified: Secondary | ICD-10-CM | POA: Diagnosis not present

## 2018-10-20 DIAGNOSIS — Z7951 Long term (current) use of inhaled steroids: Secondary | ICD-10-CM | POA: Diagnosis not present

## 2018-10-20 DIAGNOSIS — J449 Chronic obstructive pulmonary disease, unspecified: Secondary | ICD-10-CM | POA: Diagnosis not present

## 2018-10-24 DIAGNOSIS — I5032 Chronic diastolic (congestive) heart failure: Secondary | ICD-10-CM | POA: Diagnosis not present

## 2018-10-24 DIAGNOSIS — D649 Anemia, unspecified: Secondary | ICD-10-CM | POA: Diagnosis not present

## 2018-10-24 DIAGNOSIS — Z7951 Long term (current) use of inhaled steroids: Secondary | ICD-10-CM | POA: Diagnosis not present

## 2018-10-24 DIAGNOSIS — G8929 Other chronic pain: Secondary | ICD-10-CM | POA: Diagnosis not present

## 2018-10-24 DIAGNOSIS — E119 Type 2 diabetes mellitus without complications: Secondary | ICD-10-CM | POA: Diagnosis not present

## 2018-10-24 DIAGNOSIS — J9621 Acute and chronic respiratory failure with hypoxia: Secondary | ICD-10-CM | POA: Diagnosis not present

## 2018-10-24 DIAGNOSIS — J449 Chronic obstructive pulmonary disease, unspecified: Secondary | ICD-10-CM | POA: Diagnosis not present

## 2018-10-24 DIAGNOSIS — E785 Hyperlipidemia, unspecified: Secondary | ICD-10-CM | POA: Diagnosis not present

## 2018-10-24 DIAGNOSIS — I110 Hypertensive heart disease with heart failure: Secondary | ICD-10-CM | POA: Diagnosis not present

## 2018-10-25 DIAGNOSIS — I110 Hypertensive heart disease with heart failure: Secondary | ICD-10-CM | POA: Diagnosis not present

## 2018-10-25 DIAGNOSIS — D649 Anemia, unspecified: Secondary | ICD-10-CM | POA: Diagnosis not present

## 2018-10-25 DIAGNOSIS — E119 Type 2 diabetes mellitus without complications: Secondary | ICD-10-CM | POA: Diagnosis not present

## 2018-10-25 DIAGNOSIS — G8929 Other chronic pain: Secondary | ICD-10-CM | POA: Diagnosis not present

## 2018-10-25 DIAGNOSIS — Z7951 Long term (current) use of inhaled steroids: Secondary | ICD-10-CM | POA: Diagnosis not present

## 2018-10-25 DIAGNOSIS — I5032 Chronic diastolic (congestive) heart failure: Secondary | ICD-10-CM | POA: Diagnosis not present

## 2018-10-25 DIAGNOSIS — E785 Hyperlipidemia, unspecified: Secondary | ICD-10-CM | POA: Diagnosis not present

## 2018-10-25 DIAGNOSIS — J9621 Acute and chronic respiratory failure with hypoxia: Secondary | ICD-10-CM | POA: Diagnosis not present

## 2018-10-25 DIAGNOSIS — J449 Chronic obstructive pulmonary disease, unspecified: Secondary | ICD-10-CM | POA: Diagnosis not present

## 2018-10-26 DIAGNOSIS — I509 Heart failure, unspecified: Secondary | ICD-10-CM | POA: Diagnosis not present

## 2018-10-27 DIAGNOSIS — G8929 Other chronic pain: Secondary | ICD-10-CM | POA: Diagnosis not present

## 2018-10-27 DIAGNOSIS — D649 Anemia, unspecified: Secondary | ICD-10-CM | POA: Diagnosis not present

## 2018-10-27 DIAGNOSIS — J9621 Acute and chronic respiratory failure with hypoxia: Secondary | ICD-10-CM | POA: Diagnosis not present

## 2018-10-27 DIAGNOSIS — E119 Type 2 diabetes mellitus without complications: Secondary | ICD-10-CM | POA: Diagnosis not present

## 2018-10-27 DIAGNOSIS — I5032 Chronic diastolic (congestive) heart failure: Secondary | ICD-10-CM | POA: Diagnosis not present

## 2018-10-27 DIAGNOSIS — J449 Chronic obstructive pulmonary disease, unspecified: Secondary | ICD-10-CM | POA: Diagnosis not present

## 2018-10-27 DIAGNOSIS — E785 Hyperlipidemia, unspecified: Secondary | ICD-10-CM | POA: Diagnosis not present

## 2018-10-27 DIAGNOSIS — I110 Hypertensive heart disease with heart failure: Secondary | ICD-10-CM | POA: Diagnosis not present

## 2018-10-27 DIAGNOSIS — Z7951 Long term (current) use of inhaled steroids: Secondary | ICD-10-CM | POA: Diagnosis not present

## 2018-10-28 DIAGNOSIS — I509 Heart failure, unspecified: Secondary | ICD-10-CM | POA: Diagnosis not present

## 2018-10-29 DIAGNOSIS — I509 Heart failure, unspecified: Secondary | ICD-10-CM | POA: Diagnosis not present

## 2018-10-31 DIAGNOSIS — I739 Peripheral vascular disease, unspecified: Secondary | ICD-10-CM | POA: Diagnosis not present

## 2018-10-31 DIAGNOSIS — I10 Essential (primary) hypertension: Secondary | ICD-10-CM | POA: Diagnosis not present

## 2018-10-31 DIAGNOSIS — J432 Centrilobular emphysema: Secondary | ICD-10-CM | POA: Diagnosis not present

## 2018-10-31 DIAGNOSIS — M5136 Other intervertebral disc degeneration, lumbar region: Secondary | ICD-10-CM | POA: Diagnosis not present

## 2018-10-31 DIAGNOSIS — I272 Pulmonary hypertension, unspecified: Secondary | ICD-10-CM | POA: Diagnosis not present

## 2018-10-31 DIAGNOSIS — E1151 Type 2 diabetes mellitus with diabetic peripheral angiopathy without gangrene: Secondary | ICD-10-CM | POA: Diagnosis not present

## 2018-10-31 DIAGNOSIS — G4733 Obstructive sleep apnea (adult) (pediatric): Secondary | ICD-10-CM | POA: Diagnosis not present

## 2018-10-31 DIAGNOSIS — E119 Type 2 diabetes mellitus without complications: Secondary | ICD-10-CM | POA: Diagnosis not present

## 2018-10-31 DIAGNOSIS — E7800 Pure hypercholesterolemia, unspecified: Secondary | ICD-10-CM | POA: Diagnosis not present

## 2018-11-01 DIAGNOSIS — E785 Hyperlipidemia, unspecified: Secondary | ICD-10-CM | POA: Diagnosis not present

## 2018-11-01 DIAGNOSIS — G8929 Other chronic pain: Secondary | ICD-10-CM | POA: Diagnosis not present

## 2018-11-01 DIAGNOSIS — J449 Chronic obstructive pulmonary disease, unspecified: Secondary | ICD-10-CM | POA: Diagnosis not present

## 2018-11-01 DIAGNOSIS — I5032 Chronic diastolic (congestive) heart failure: Secondary | ICD-10-CM | POA: Diagnosis not present

## 2018-11-01 DIAGNOSIS — D649 Anemia, unspecified: Secondary | ICD-10-CM | POA: Diagnosis not present

## 2018-11-01 DIAGNOSIS — I110 Hypertensive heart disease with heart failure: Secondary | ICD-10-CM | POA: Diagnosis not present

## 2018-11-01 DIAGNOSIS — J9621 Acute and chronic respiratory failure with hypoxia: Secondary | ICD-10-CM | POA: Diagnosis not present

## 2018-11-01 DIAGNOSIS — E119 Type 2 diabetes mellitus without complications: Secondary | ICD-10-CM | POA: Diagnosis not present

## 2018-11-01 DIAGNOSIS — Z7951 Long term (current) use of inhaled steroids: Secondary | ICD-10-CM | POA: Diagnosis not present

## 2018-11-02 DIAGNOSIS — R0609 Other forms of dyspnea: Secondary | ICD-10-CM | POA: Diagnosis not present

## 2018-11-02 DIAGNOSIS — R1084 Generalized abdominal pain: Secondary | ICD-10-CM | POA: Diagnosis not present

## 2018-11-02 DIAGNOSIS — F17200 Nicotine dependence, unspecified, uncomplicated: Secondary | ICD-10-CM | POA: Diagnosis not present

## 2018-11-02 DIAGNOSIS — I10 Essential (primary) hypertension: Secondary | ICD-10-CM | POA: Diagnosis not present

## 2018-11-02 DIAGNOSIS — R110 Nausea: Secondary | ICD-10-CM | POA: Diagnosis not present

## 2018-11-02 DIAGNOSIS — J42 Unspecified chronic bronchitis: Secondary | ICD-10-CM | POA: Diagnosis not present

## 2018-11-02 DIAGNOSIS — I270 Primary pulmonary hypertension: Secondary | ICD-10-CM | POA: Diagnosis not present

## 2018-11-02 DIAGNOSIS — I701 Atherosclerosis of renal artery: Secondary | ICD-10-CM | POA: Diagnosis not present

## 2018-11-02 DIAGNOSIS — A048 Other specified bacterial intestinal infections: Secondary | ICD-10-CM | POA: Diagnosis not present

## 2018-11-02 DIAGNOSIS — N289 Disorder of kidney and ureter, unspecified: Secondary | ICD-10-CM | POA: Diagnosis not present

## 2018-11-02 DIAGNOSIS — E785 Hyperlipidemia, unspecified: Secondary | ICD-10-CM | POA: Diagnosis not present

## 2018-11-02 DIAGNOSIS — G4733 Obstructive sleep apnea (adult) (pediatric): Secondary | ICD-10-CM | POA: Diagnosis not present

## 2018-11-03 DIAGNOSIS — G8929 Other chronic pain: Secondary | ICD-10-CM | POA: Diagnosis not present

## 2018-11-03 DIAGNOSIS — D649 Anemia, unspecified: Secondary | ICD-10-CM | POA: Diagnosis not present

## 2018-11-03 DIAGNOSIS — Z7951 Long term (current) use of inhaled steroids: Secondary | ICD-10-CM | POA: Diagnosis not present

## 2018-11-03 DIAGNOSIS — E785 Hyperlipidemia, unspecified: Secondary | ICD-10-CM | POA: Diagnosis not present

## 2018-11-03 DIAGNOSIS — J449 Chronic obstructive pulmonary disease, unspecified: Secondary | ICD-10-CM | POA: Diagnosis not present

## 2018-11-03 DIAGNOSIS — E119 Type 2 diabetes mellitus without complications: Secondary | ICD-10-CM | POA: Diagnosis not present

## 2018-11-03 DIAGNOSIS — I5032 Chronic diastolic (congestive) heart failure: Secondary | ICD-10-CM | POA: Diagnosis not present

## 2018-11-03 DIAGNOSIS — I110 Hypertensive heart disease with heart failure: Secondary | ICD-10-CM | POA: Diagnosis not present

## 2018-11-03 DIAGNOSIS — J9621 Acute and chronic respiratory failure with hypoxia: Secondary | ICD-10-CM | POA: Diagnosis not present

## 2018-11-06 ENCOUNTER — Emergency Department: Payer: Medicare HMO

## 2018-11-06 ENCOUNTER — Inpatient Hospital Stay
Admission: EM | Admit: 2018-11-06 | Discharge: 2018-11-09 | DRG: 291 | Disposition: A | Discharge status: Home Health | Payer: Medicare HMO | Attending: Internal Medicine | Admitting: Internal Medicine

## 2018-11-06 ENCOUNTER — Other Ambulatory Visit: Payer: Self-pay

## 2018-11-06 DIAGNOSIS — N182 Chronic kidney disease, stage 2 (mild): Secondary | ICD-10-CM | POA: Diagnosis present

## 2018-11-06 DIAGNOSIS — Z7982 Long term (current) use of aspirin: Secondary | ICD-10-CM | POA: Diagnosis not present

## 2018-11-06 DIAGNOSIS — I471 Supraventricular tachycardia: Secondary | ICD-10-CM | POA: Diagnosis not present

## 2018-11-06 DIAGNOSIS — R0602 Shortness of breath: Secondary | ICD-10-CM | POA: Diagnosis not present

## 2018-11-06 DIAGNOSIS — I2721 Secondary pulmonary arterial hypertension: Secondary | ICD-10-CM | POA: Diagnosis not present

## 2018-11-06 DIAGNOSIS — E1142 Type 2 diabetes mellitus with diabetic polyneuropathy: Secondary | ICD-10-CM | POA: Diagnosis not present

## 2018-11-06 DIAGNOSIS — Z90710 Acquired absence of both cervix and uterus: Secondary | ICD-10-CM | POA: Diagnosis not present

## 2018-11-06 DIAGNOSIS — Z9981 Dependence on supplemental oxygen: Secondary | ICD-10-CM

## 2018-11-06 DIAGNOSIS — Z87442 Personal history of urinary calculi: Secondary | ICD-10-CM

## 2018-11-06 DIAGNOSIS — J438 Other emphysema: Secondary | ICD-10-CM | POA: Diagnosis not present

## 2018-11-06 DIAGNOSIS — Z9989 Dependence on other enabling machines and devices: Secondary | ICD-10-CM | POA: Diagnosis not present

## 2018-11-06 DIAGNOSIS — E785 Hyperlipidemia, unspecified: Secondary | ICD-10-CM | POA: Diagnosis present

## 2018-11-06 DIAGNOSIS — I509 Heart failure, unspecified: Secondary | ICD-10-CM | POA: Diagnosis not present

## 2018-11-06 DIAGNOSIS — E119 Type 2 diabetes mellitus without complications: Secondary | ICD-10-CM | POA: Diagnosis not present

## 2018-11-06 DIAGNOSIS — Z800 Family history of malignant neoplasm of digestive organs: Secondary | ICD-10-CM | POA: Diagnosis not present

## 2018-11-06 DIAGNOSIS — Z7951 Long term (current) use of inhaled steroids: Secondary | ICD-10-CM

## 2018-11-06 DIAGNOSIS — J9611 Chronic respiratory failure with hypoxia: Secondary | ICD-10-CM | POA: Diagnosis present

## 2018-11-06 DIAGNOSIS — G4733 Obstructive sleep apnea (adult) (pediatric): Secondary | ICD-10-CM | POA: Diagnosis present

## 2018-11-06 DIAGNOSIS — Z7984 Long term (current) use of oral hypoglycemic drugs: Secondary | ICD-10-CM

## 2018-11-06 DIAGNOSIS — F329 Major depressive disorder, single episode, unspecified: Secondary | ICD-10-CM | POA: Diagnosis present

## 2018-11-06 DIAGNOSIS — E1122 Type 2 diabetes mellitus with diabetic chronic kidney disease: Secondary | ICD-10-CM | POA: Diagnosis not present

## 2018-11-06 DIAGNOSIS — J449 Chronic obstructive pulmonary disease, unspecified: Secondary | ICD-10-CM | POA: Diagnosis present

## 2018-11-06 DIAGNOSIS — R809 Proteinuria, unspecified: Secondary | ICD-10-CM | POA: Diagnosis not present

## 2018-11-06 DIAGNOSIS — I5022 Chronic systolic (congestive) heart failure: Secondary | ICD-10-CM | POA: Diagnosis not present

## 2018-11-06 DIAGNOSIS — I130 Hypertensive heart and chronic kidney disease with heart failure and stage 1 through stage 4 chronic kidney disease, or unspecified chronic kidney disease: Secondary | ICD-10-CM | POA: Diagnosis not present

## 2018-11-06 DIAGNOSIS — R42 Dizziness and giddiness: Secondary | ICD-10-CM | POA: Diagnosis not present

## 2018-11-06 DIAGNOSIS — Z87891 Personal history of nicotine dependence: Secondary | ICD-10-CM

## 2018-11-06 DIAGNOSIS — I10 Essential (primary) hypertension: Secondary | ICD-10-CM | POA: Diagnosis not present

## 2018-11-06 DIAGNOSIS — Z8249 Family history of ischemic heart disease and other diseases of the circulatory system: Secondary | ICD-10-CM

## 2018-11-06 DIAGNOSIS — Z87440 Personal history of urinary (tract) infections: Secondary | ICD-10-CM | POA: Diagnosis not present

## 2018-11-06 DIAGNOSIS — Z79890 Hormone replacement therapy (postmenopausal): Secondary | ICD-10-CM

## 2018-11-06 DIAGNOSIS — N3000 Acute cystitis without hematuria: Secondary | ICD-10-CM | POA: Diagnosis present

## 2018-11-06 DIAGNOSIS — Z888 Allergy status to other drugs, medicaments and biological substances status: Secondary | ICD-10-CM

## 2018-11-06 DIAGNOSIS — I110 Hypertensive heart disease with heart failure: Secondary | ICD-10-CM | POA: Diagnosis not present

## 2018-11-06 DIAGNOSIS — Z79899 Other long term (current) drug therapy: Secondary | ICD-10-CM

## 2018-11-06 DIAGNOSIS — I5033 Acute on chronic diastolic (congestive) heart failure: Secondary | ICD-10-CM | POA: Diagnosis not present

## 2018-11-06 LAB — CBC
HEMATOCRIT: 40.2 % (ref 36.0–46.0)
Hemoglobin: 12.3 g/dL (ref 12.0–15.0)
MCH: 27.9 pg (ref 26.0–34.0)
MCHC: 30.6 g/dL (ref 30.0–36.0)
MCV: 91.2 fL (ref 80.0–100.0)
NRBC: 0 % (ref 0.0–0.2)
PLATELETS: 223 10*3/uL (ref 150–400)
RBC: 4.41 MIL/uL (ref 3.87–5.11)
RDW: 14.4 % (ref 11.5–15.5)
WBC: 11.8 10*3/uL — ABNORMAL HIGH (ref 4.0–10.5)

## 2018-11-06 LAB — COMPREHENSIVE METABOLIC PANEL
ALT: 16 U/L (ref 0–44)
ANION GAP: 8 (ref 5–15)
AST: 17 U/L (ref 15–41)
Albumin: 3.9 g/dL (ref 3.5–5.0)
Alkaline Phosphatase: 60 U/L (ref 38–126)
BILIRUBIN TOTAL: 0.6 mg/dL (ref 0.3–1.2)
BUN: 23 mg/dL (ref 8–23)
CHLORIDE: 102 mmol/L (ref 98–111)
CO2: 31 mmol/L (ref 22–32)
Calcium: 9.4 mg/dL (ref 8.9–10.3)
Creatinine, Ser: 0.77 mg/dL (ref 0.44–1.00)
GFR calc Af Amer: >60 mL/min (ref >60)
GLUCOSE: 101 mg/dL — AB (ref 70–99)
POTASSIUM: 4.1 mmol/L (ref 3.5–5.1)
Sodium: 141 mmol/L (ref 135–145)
TOTAL PROTEIN: 6.9 g/dL (ref 6.5–8.1)

## 2018-11-06 LAB — BRAIN NATRIURETIC PEPTIDE: B Natriuretic Peptide: 342 pg/mL — ABNORMAL HIGH (ref 0.0–100.0)

## 2018-11-06 LAB — TROPONIN I: TROPONIN I: 0.04 ng/mL — AB (ref <0.03)

## 2018-11-06 LAB — GLUCOSE, CAPILLARY: Glucose-Capillary: 209 mg/dL — ABNORMAL HIGH (ref 70–99)

## 2018-11-06 MED ORDER — FUROSEMIDE 10 MG/ML IJ SOLN
20.0000 mg | Freq: Two times a day (BID) | INTRAMUSCULAR | Status: DC
  Administered 2018-11-07 – 2018-11-08 (×3): 20 mg via INTRAVENOUS
  Filled 2018-11-06 (×3): qty 2

## 2018-11-06 MED ORDER — ALBUTEROL SULFATE (2.5 MG/3ML) 0.083% IN NEBU: 2.5000 mg | INHALATION_SOLUTION | Freq: Four times a day (QID) | RESPIRATORY_TRACT | Status: DC | PRN

## 2018-11-06 MED ORDER — HEPARIN SODIUM (PORCINE) 5000 UNIT/ML IJ SOLN
5000.0000 [IU] | Freq: Three times a day (TID) | INTRAMUSCULAR | Status: DC
  Administered 2018-11-06 – 2018-11-09 (×8): 5000 [IU] via SUBCUTANEOUS
  Filled 2018-11-06 (×8): qty 1

## 2018-11-06 MED ORDER — ONDANSETRON 4 MG PO TBDP
4.0000 mg | ORAL_TABLET | Freq: Three times a day (TID) | ORAL | Status: DC | PRN
  Filled 2018-11-06: qty 1

## 2018-11-06 MED ORDER — METFORMIN HCL 500 MG PO TABS
500.0000 mg | ORAL_TABLET | Freq: Every day | ORAL | Status: DC
  Administered 2018-11-07 – 2018-11-08 (×2): 500 mg via ORAL
  Filled 2018-11-06 (×2): qty 1

## 2018-11-06 MED ORDER — PANTOPRAZOLE SODIUM 40 MG PO TBEC
40.0000 mg | DELAYED_RELEASE_TABLET | Freq: Two times a day (BID) | ORAL | Status: DC
  Administered 2018-11-07 – 2018-11-09 (×4): 40 mg via ORAL
  Filled 2018-11-06 (×5): qty 1

## 2018-11-06 MED ORDER — TIOTROPIUM BROMIDE MONOHYDRATE 18 MCG IN CAPS
18.0000 ug | ORAL_CAPSULE | Freq: Every day | RESPIRATORY_TRACT | Status: DC
  Administered 2018-11-07 – 2018-11-09 (×3): 18 ug via RESPIRATORY_TRACT
  Filled 2018-11-06: qty 5

## 2018-11-06 MED ORDER — MONTELUKAST SODIUM 10 MG PO TABS
10.0000 mg | ORAL_TABLET | Freq: Every day | ORAL | Status: DC
  Administered 2018-11-06 – 2018-11-08 (×3): 10 mg via ORAL
  Filled 2018-11-06 (×3): qty 1

## 2018-11-06 MED ORDER — CARVEDILOL 12.5 MG PO TABS
12.5000 mg | ORAL_TABLET | Freq: Two times a day (BID) | ORAL | Status: DC
  Administered 2018-11-06 – 2018-11-09 (×6): 12.5 mg via ORAL
  Filled 2018-11-06 (×6): qty 1

## 2018-11-06 MED ORDER — POLYETHYLENE GLYCOL 3350 17 G PO PACK
17.0000 g | PACK | Freq: Every day | ORAL | Status: DC
  Administered 2018-11-07 – 2018-11-09 (×3): 17 g via ORAL
  Filled 2018-11-06 (×3): qty 1

## 2018-11-06 MED ORDER — POTASSIUM CHLORIDE CRYS ER 10 MEQ PO TBCR
10.0000 meq | EXTENDED_RELEASE_TABLET | Freq: Two times a day (BID) | ORAL | Status: DC
  Administered 2018-11-07 – 2018-11-09 (×5): 10 meq via ORAL
  Filled 2018-11-06 (×5): qty 1

## 2018-11-06 MED ORDER — FUROSEMIDE 10 MG/ML IJ SOLN
40.0000 mg | Freq: Once | INTRAMUSCULAR | Status: AC
  Administered 2018-11-06: 40 mg via INTRAVENOUS
  Filled 2018-11-06: qty 4

## 2018-11-06 MED ORDER — MOMETASONE FURO-FORMOTEROL FUM 200-5 MCG/ACT IN AERO
2.0000 | INHALATION_SPRAY | Freq: Two times a day (BID) | RESPIRATORY_TRACT | Status: DC
  Administered 2018-11-06 – 2018-11-09 (×6): 2 via RESPIRATORY_TRACT
  Filled 2018-11-06: qty 8.8

## 2018-11-06 MED ORDER — SUCRALFATE 1 G PO TABS
1.0000 g | ORAL_TABLET | Freq: Three times a day (TID) | ORAL | Status: DC
  Administered 2018-11-07 – 2018-11-09 (×8): 1 g via ORAL
  Filled 2018-11-06 (×9): qty 1

## 2018-11-06 MED ORDER — ASPIRIN EC 325 MG PO TBEC
325.0000 mg | DELAYED_RELEASE_TABLET | Freq: Every day | ORAL | Status: DC
  Administered 2018-11-07 – 2018-11-09 (×3): 325 mg via ORAL
  Filled 2018-11-06 (×3): qty 1

## 2018-11-06 MED ORDER — ATORVASTATIN CALCIUM 20 MG PO TABS: 40.0000 mg | ORAL_TABLET | Freq: Every day | ORAL | Status: DC

## 2018-11-06 MED ORDER — HYDRALAZINE HCL 20 MG/ML IJ SOLN: 10.0000 mg | Freq: Four times a day (QID) | INTRAMUSCULAR | Status: DC | PRN

## 2018-11-06 MED ORDER — DOCUSATE SODIUM 100 MG PO CAPS: 100.0000 mg | ORAL_CAPSULE | Freq: Two times a day (BID) | ORAL | Status: DC | PRN

## 2018-11-06 MED ORDER — CLONAZEPAM 0.5 MG PO TABS: 0.5000 mg | ORAL_TABLET | Freq: Every day | ORAL | Status: DC

## 2018-11-06 MED ORDER — INSULIN ASPART 100 UNIT/ML ~~LOC~~ SOLN
0.0000 [IU] | Freq: Three times a day (TID) | SUBCUTANEOUS | Status: DC
  Administered 2018-11-07: 2 [IU] via SUBCUTANEOUS
  Administered 2018-11-07: 1 [IU] via SUBCUTANEOUS
  Administered 2018-11-08: 2 [IU] via SUBCUTANEOUS
  Administered 2018-11-08: 1 [IU] via SUBCUTANEOUS
  Filled 2018-11-06 (×4): qty 1

## 2018-11-06 MED ORDER — CLONAZEPAM 0.5 MG PO TABS
0.5000 mg | ORAL_TABLET | Freq: Every evening | ORAL | Status: DC | PRN
  Administered 2018-11-06 – 2018-11-08 (×3): 0.5 mg via ORAL
  Filled 2018-11-06 (×3): qty 1

## 2018-11-06 MED ORDER — LABETALOL HCL 5 MG/ML IV SOLN
5.0000 mg | Freq: Once | INTRAVENOUS | Status: AC
  Administered 2018-11-06: 5 mg via INTRAVENOUS
  Filled 2018-11-06: qty 4

## 2018-11-06 MED ORDER — VALACYCLOVIR HCL 500 MG PO TABS
1000.0000 mg | ORAL_TABLET | Freq: Every day | ORAL | Status: DC | PRN
  Filled 2018-11-06: qty 2

## 2018-11-06 MED ORDER — IRBESARTAN 150 MG PO TABS
300.0000 mg | ORAL_TABLET | Freq: Every day | ORAL | Status: DC
  Administered 2018-11-06 – 2018-11-07 (×2): 300 mg via ORAL
  Filled 2018-11-06 (×2): qty 2

## 2018-11-06 MED ORDER — GABAPENTIN 400 MG PO CAPS
800.0000 mg | ORAL_CAPSULE | Freq: Three times a day (TID) | ORAL | Status: DC
  Administered 2018-11-06 – 2018-11-09 (×8): 800 mg via ORAL
  Filled 2018-11-06 (×9): qty 2

## 2018-11-06 MED ORDER — OXYCODONE HCL ER 10 MG PO T12A
10.0000 mg | EXTENDED_RELEASE_TABLET | Freq: Two times a day (BID) | ORAL | Status: DC
  Administered 2018-11-07 – 2018-11-09 (×5): 10 mg via ORAL
  Filled 2018-11-06 (×5): qty 1

## 2018-11-06 NOTE — H&P (Signed)
[facility] NAME: Megan Bolton    MR#:  [id number]  DATE OF BIRTH:  [birth year]  DATE OF ADMISSION:  11/06/2018  PRIMARY CARE PHYSICIAN: Baxter Hire, MD   REQUESTING/REFERRING PHYSICIAN: Corky Downs  CHIEF COMPLAINT:   Chief Complaint  Patient presents with  . Hypertension    HISTORY OF PRESENT ILLNESS: Megan Bolton  is a 77 y.o. female with a known history of CHF, COPD, diabetes, hypertension, hyperlipidemia-had some changes in her blood pressure medicines recently by her nephrologist. For last few days her blood pressure is now running up and she is feeling increasingly short of breath so came to emergency room.  Blood pressure noted initially 830 in systolic and also has some pulmonary edema on chest x-ray. She denies any chest pain or cough.  PAST MEDICAL HISTORY:   Past Medical History:  Diagnosis Date  . CHF (congestive heart failure) ([place])   . COPD (chronic obstructive pulmonary disease) ([place])   . Diabetes mellitus without complication ([place])   . Diastolic heart failure ([place])   . Hyperlipidemia   . Hypertension     PAST SURGICAL HISTORY:  Past Surgical History:  Procedure Laterality Date  . ABDOMINAL HYSTERECTOMY    . SPINE SURGERY      SOCIAL HISTORY:  Social History   Tobacco Use  . Smoking status: Former Smoker    Packs/day: 0.50    Last attempt to quit: 04/28/2015    Years since quitting: 3.5  . Smokeless tobacco: Never Used  Substance Use Topics  . Alcohol use: No    Alcohol/week: 0.0 standard drinks    FAMILY HISTORY:  Family History  Problem Relation Age of Onset  . Hypertension Mother   . Colon cancer Father   . Hypertension Paternal Grandfather   . Bladder Cancer Neg Hx   . Kidney cancer Neg Hx     DRUG ALLERGIES:  Allergies  Allergen Reactions  . Nitrofurantoin Anaphylaxis    REVIEW OF SYSTEMS:   CONSTITUTIONAL: No fever, fatigue or weakness.  EYES: No blurred or double vision.   EARS, NOSE, AND THROAT: No tinnitus or ear pain.  RESPIRATORY: No cough, have shortness of breath, no wheezing or hemoptysis.  CARDIOVASCULAR: No chest pain, orthopnea, edema.  GASTROINTESTINAL: No nausea, vomiting, diarrhea or abdominal pain.  GENITOURINARY: No dysuria, hematuria.  ENDOCRINE: No polyuria, nocturia,  HEMATOLOGY: No anemia, easy bruising or bleeding SKIN: No rash or lesion. MUSCULOSKELETAL: No joint pain or arthritis.   NEUROLOGIC: No tingling, numbness, weakness.  PSYCHIATRY: No anxiety or depression.   MEDICATIONS AT HOME:  Prior to Admission medications   Medication Sig Start Date End Date Taking? Authorizing Provider  polyethylene glycol (MIRALAX / GLYCOLAX) packet Take 17 g by mouth daily.  05/10/18  Yes [provider]  potassium chloride (K-DUR) 10 MEQ tablet Take 1 tablet (10 mEq total) by mouth 2 (two) times daily. 10/12/17  Yes Karamalegos, Alexander J, DO  tiotropium (SPIRIVA HANDIHALER) 18 MCG inhalation capsule Place 1 capsule (18 mcg total) into inhaler and inhale daily. 08/27/17  Yes Karamalegos, Devonne Doughty, DO  valACYclovir (VALTREX) 1000 MG tablet Take 1 tablet (1,000 mg total) by mouth daily as needed (for fever blisters). 04/19/16  Yes Gladstone Lighter, MD  ACCU-CHEK AVIVA PLUS test strip 1 each by Other route 2 (two) times daily. 09/08/17   Karamalegos, Devonne Doughty, DO  ACCU-CHEK SOFTCLIX LANCETS lancets 1 each by Other route 2 (two) times daily. 09/08/17  Karamalegos, Alexander J, DO  albuterol (PROVENTIL) (2.5 MG/3ML) 0.083% nebulizer solution Take 3 mLs (2.5 mg total) by nebulization every 6 (six) hours as needed for wheezing or shortness of breath. 04/08/18   Delman Kitten, MD  albuterol (VENTOLIN HFA) 108 (90 Base) MCG/ACT inhaler Inhale 2 puffs into the lungs every 6 (six) hours as needed for wheezing or shortness of breath. 05/12/17   Karamalegos, Devonne Doughty, DO  aspirin EC 325 MG tablet Take 325 mg by mouth daily.    [provider]   atorvastatin (LIPITOR) 40 MG tablet Take 1 tablet (40 mg total) by mouth daily at 6 PM. 09/28/18   Vaughan Basta, MD  Blood Glucose Monitoring Suppl (ACCU-CHEK AVIVA PLUS) w/Device KIT USE AS DIRECTED 01/12/17   Arlis Porta., MD  carvedilol (COREG) 12.5 MG tablet Take 1 tablet (12.5 mg total) by mouth 2 (two) times daily. 09/28/18   Vaughan Basta, MD  clonazePAM (KLONOPIN) 0.5 MG tablet Take 0.5 mg by mouth at bedtime.  07/04/15   [provider]  conjugated estrogens (PREMARIN) vaginal cream Apply 0.47m (pea-sized amount)  just inside the vaginal introitus with a finger-tip on  Monday, Wednesday and Friday nights. 07/20/18   MZara CouncilA, PA-C  estradiol (ESTRACE VAGINAL) 0.1 MG/GM vaginal cream Apply 0.54m(pea-sized amount)  just inside the vaginal introitus with a finger-tip on Monday, Wednesday and Friday nights. Patient not taking: Reported on 09/25/2018 07/20/18   McZara Council, PA-C  Fluticasone-Salmeterol (ADVAIR DISKUS) 250-50 MCG/DOSE AEPB Inhale 1 puff into the lungs 2 (two) times daily. 05/04/17   Karamalegos, AlDevonne DoughtyDO  furosemide (LASIX) 20 MG tablet Take 1 tablet (20 mg total) by mouth daily. 09/29/18   VaVaughan BastaMD  gabapentin (NEURONTIN) 800 MG tablet Take 1 tablet (800 mg total) by mouth 3 (three) times daily. 12/01/16   HaArlis Porta MD  irbesartan-hydrochlorothiazide (AVALIDE) 300-12.5 MG tablet Take 1 tablet by mouth daily.  08/06/17   [provider]  metFORMIN (GLUCOPHAGE) 500 MG tablet TAKE ONE TABLET BY MOUTH EVERY MORNING WITH BREAKFAST Patient taking differently: Take 500 mg by mouth daily.  03/24/17   HaArlis Porta MD  montelukast (SINGULAIR) 10 MG tablet TAKE ONE TABLET BY MOUTH AT BEDTIME 03/24/17   HaArlis Porta MD  ondansetron (ZOFRAN-ODT) 4 MG disintegrating tablet Take 4 mg by mouth every 8 (eight) hours as needed for nausea/vomiting. 08/18/18   [provider]  OXYCONTIN 10  MG 12 hr tablet Take 10 mg by mouth 2 (two) times daily.    [provider]  OXYGEN Inhale 2 L into the lungs continuous.    [provider]  pantoprazole (PROTONIX) 40 MG tablet Take 40 mg by mouth 2 (two) times daily before a meal.  12/08/17 12/08/18  [provider]  sucralfate (CARAFATE) 1 g tablet Take 1 tablet by mouth 2 (two) times daily.  02/15/18 02/15/19  [provider]      PHYSICAL EXAMINATION:   VITAL SIGNS: Blood pressure (!) 158/79, pulse 66, temperature 97.9 F (36.6 C), temperature source Oral, resp. rate 19, weight 96.6 kg, SpO2 96 %.  GENERAL:  [age].o.-year-old patient lying in the bed with no acute distress.  EYES: Pupils equal, round, reactive to light and accommodation. No scleral icterus. Extraocular muscles intact.  HEENT: Head atraumatic, normocephalic. Oropharynx and nasopharynx clear.  NECK:  Supple, no jugular venous distention. No thyroid enlargement, no tenderness.  LUNGS: Normal breath sounds bilaterally,  no wheezing, mild crepitation. No use of accessory muscles of respiration.  CARDIOVASCULAR: S1, S2 normal. No murmurs, rubs, or gallops.  ABDOMEN: Soft, nontender, nondistended. Bowel sounds present. No organomegaly or mass.  EXTREMITIES: No pedal edema, cyanosis, or clubbing.  NEUROLOGIC: Cranial nerves II through XII are intact. Muscle strength 5/5 in all extremities. Sensation intact. Gait not checked.  PSYCHIATRIC: The patient is alert and oriented x 3.  SKIN: No obvious rash, lesion, or ulcer.   LABORATORY PANEL:   CBC Recent Labs  Lab 11/06/18 1905  WBC 11.8*  HGB 12.3  HCT 40.2  PLT 223  MCV 91.2  MCH 27.9  MCHC 30.6  RDW 14.4   ------------------------------------------------------------------------------------------------------------------  Chemistries  Recent Labs  Lab 11/06/18 1905  NA 141  K 4.1  CL 102  CO2 31  GLUCOSE 101*  BUN 23  CREATININE 0.77  CALCIUM 9.4  AST 17  ALT 16   ALKPHOS 60  BILITOT 0.6   ------------------------------------------------------------------------------------------------------------------ estimated creatinine clearance is 67.7 mL/min (by C-G formula based on SCr of 0.77 mg/dL). ------------------------------------------------------------------------------------------------------------------ No results for input(s): TSH, T4TOTAL, T3FREE, THYROIDAB in the last 72 hours.  Invalid input(s): FREET3   Coagulation profile No results for input(s): INR, PROTIME in the last 168 hours. ------------------------------------------------------------------------------------------------------------------- No results for input(s): DDIMER in the last 72 hours. -------------------------------------------------------------------------------------------------------------------  Cardiac Enzymes Recent Labs  Lab 11/06/18 1905  TROPONINI 0.04*   ------------------------------------------------------------------------------------------------------------------ Invalid input(s): POCBNP  ---------------------------------------------------------------------------------------------------------------  Urinalysis    Component Value Date/Time   COLORURINE STRAW (A) 09/25/2018 1225   APPEARANCEUR CLEAR (A) 09/25/2018 1225   APPEARANCEUR Cloudy 09/19/2014 1157   LABSPEC 1.006 09/25/2018 1225   LABSPEC 1.017 09/19/2014 1157   PHURINE 7.0 09/25/2018 1225   GLUCOSEU NEGATIVE 09/25/2018 1225   GLUCOSEU Negative 09/19/2014 1157   HGBUR NEGATIVE 09/25/2018 1225   BILIRUBINUR NEGATIVE 09/25/2018 1225   BILIRUBINUR neg 12/16/2016 1118   BILIRUBINUR Negative 09/19/2014 1157   KETONESUR NEGATIVE 09/25/2018 1225   PROTEINUR NEGATIVE 09/25/2018 1225   UROBILINOGEN 0.2 12/16/2016 1118   NITRITE NEGATIVE 09/25/2018 1225   LEUKOCYTESUR NEGATIVE 09/25/2018 1225   LEUKOCYTESUR Negative 09/19/2014 1157     RADIOLOGY: Dg Chest Portable 1 View  Result Date:  11/06/2018 CLINICAL DATA:  Elevated blood pressure. Shortness of breath. EXAM: PORTABLE CHEST 1 VIEW COMPARISON:  09/25/2018. FINDINGS: The heart is enlarged. There is mild vascular congestion. Platelike atelectasis is seen at the RIGHT base. No consolidation or edema. No effusion or pneumothorax. Thoracic atherosclerosis. Osteopenia. IMPRESSION: Cardiomegaly. Mild vascular congestion. No consolidation or edema. Slight improvement aeration compared with priors. Electronically Signed   By: Staci Righter M.D.   On: 11/06/2018 20:09    EKG: Orders placed or performed during the hospital encounter of 11/06/18  . ED EKG  . ED EKG    IMPRESSION AND PLAN:  *Acute on chronic diastolic congestive heart failure IV Lasix, intake and output measurement, watch renal function. Counseled about fluid restriction.  *Uncontrolled hypertension Resume home medications, she was taken off his losartan recently Resume diet over here.  Give hydralazine if needed.  *Diabetes Continue metformin and keep on sliding scale coverage.  * COPD   No exacerbation, cont nebs.  All the records are reviewed and case discussed with ED provider. Management plans discussed with the patient, family and they are in agreement.  CODE STATUS: Full Code Status History    Date Active Date Inactive Code Status Order ID Comments User Context   09/25/2018 2129 09/28/2018 1737 Full Code [id number]  Sela Hua, MD Inpatient   05/25/2018 0527 05/25/2018 1825 Full Code [id number]  Arta Silence, MD Inpatient   12/19/2017 0514 12/19/2017 1813 Full Code [id number]  Saundra Shelling, MD Inpatient   08/30/2017 1021 08/31/2017 1500 Full Code [id number]  Bettey Costa, MD Inpatient   04/18/2016 0142 04/19/2016 1732 Full Code [id number]  Lance Coon, MD ED   02/28/2016 0540 03/01/2016 1739 Full Code [id number]  Saundra Shelling, MD Inpatient       TOTAL TIME TAKING CARE OF THIS PATIENT: 45 minutes.  Discussed with daughter in  room.  Vaughan Basta M.D on 11/06/2018   Between 7am to 6pm - Pager - [phone]  After 6pm go to [email or web address] - password [email or web address] [facility]  Office  [phone]  CC: Primary care physician; Baxter Hire, MD   Note: This dictation was prepared with Dragon dictation along with smaller phrase technology. Any transcriptional errors that result from this process are unintentional.

## 2018-11-06 NOTE — Progress Notes (Signed)
Family Meeting Note  Advance Directive:yes  Today a meeting took place with the Patient and daughter.  The following clinical team members were present during this meeting:MD  The following were discussed:Patient's diagnosis: CHf, COPD, Htn, DM, Patient's progosis: Unable to determine and Goals for treatment: Full Code  Additional follow-up to be provided: Nephrologist.  Time spent during discussion:20 minutes  Vaughan Basta, MD

## 2018-11-06 NOTE — ED Provider Notes (Signed)
[facility] Emergency Department Provider Note   ____________________________________________    I have reviewed the triage vital signs and the nursing notes.   HISTORY  Chief Complaint Hypertension     HPI Megan Bolton is a 77 y.o. female with a history of CHF, COPD, diabetes who presents with elevated blood pressure, mild chest tightness and some shortness of breath.  Patient reports her doctor has been adjusting her blood pressure medications because it had been running low.  She is on far fewer blood pressure medications than she had been in the past.  She reports today her blood pressure became quite elevated, when she noticed this she developed some very mild tightness in her chest and felt short of breath.  Was in the hospital 1 month ago for CHF exacerbation.  Denies lower extreme swelling, reports compliance with her medications   Past Medical History:  Diagnosis Date  . CHF (congestive heart failure) ([place])   . COPD (chronic obstructive pulmonary disease) ([place])   . Diabetes mellitus without complication ([place])   . Diastolic heart failure ([place])   . Hyperlipidemia   . Hypertension     Patient Active Problem List   Diagnosis Date Noted  . Acute on chronic respiratory failure with hypoxia ([place]) 09/25/2018  . Acute hypoxemic respiratory failure ([place]) 05/25/2018  . CHF (congestive heart failure) ([place]) 12/19/2017  . Restless leg syndrome 09/07/2017  . CHF exacerbation ([place]) 08/30/2017  . Adjustment disorder with mixed anxiety and depressed mood 08/13/2017  . Peripheral neuropathy 07/14/2017  . Malignant hypertension 01/29/2017  . Chronic respiratory failure with hypoxia, on home O2 therapy ([place]) 12/24/2016  . Incomplete emptying of bladder 07/19/2016  . Bacteriuria 07/17/2016  . Infection due to ESBL-producing Escherichia coli 04/21/2016  . Frequent UTI 04/21/2016  . OSA on CPAP 04/18/2016  . Bladder infection, chronic 04/17/2016  .  Calculus of kidney 04/17/2016  . Community acquired pneumonia 02/28/2016  . [name] (chronic kidney disease) 07/12/2015  . Controlled type 2 diabetes mellitus with diabetic neuropathy, without long-term current use of insulin ([place]) 06/11/2015  . Arthritis, degenerative 06/11/2015  . Hypercholesteremia 06/11/2015  . 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 Injury of kidney 04/14/2015  . Essential hypertension 04/14/2015  . Blood glucose elevated 04/14/2015  . Apnea, sleep 04/14/2015  . COPD (chronic obstructive pulmonary disease) ([place]) 04/14/2015  . DDD (degenerative disc disease), lumbar 08/01/2014  . Lumbar canal stenosis 05/11/2014  . Neuritis or radiculitis due to rupture of lumbar intervertebral disc 05/11/2014  . 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 Lumbosacral spondylosis 05/11/2014  . Thoracic and lumbosacral neuritis 05/11/2014  . Accelerating angina ([place]) 03/13/2014    Past Surgical History:  Procedure Laterality Date  . ABDOMINAL HYSTERECTOMY    . SPINE SURGERY      Prior to Admission medications   Medication Sig Start Date End Date Taking? Authorizing Provider  ACCU-CHEK AVIVA PLUS test strip 1 each by Other route 2 (two) times daily. 09/08/17   Karamalegos, Devonne Doughty, DO  ACCU-CHEK SOFTCLIX LANCETS lancets 1 each by Other route 2 (two) times daily. 09/08/17   Karamalegos, Devonne Doughty, DO  albuterol (PROVENTIL) (2.5 MG/3ML) 0.083% nebulizer solution Take 3 mLs (2.5 mg total) by nebulization every 6 (six) hours as needed for wheezing or shortness of breath. 04/08/18   Delman Kitten, MD  albuterol (VENTOLIN HFA) 108 (90 Base) MCG/ACT inhaler Inhale 2 puffs into the lungs every 6 (six) hours as needed for wheezing  or shortness of breath. 05/12/17   Karamalegos, Devonne Doughty, DO  aspirin EC 325 MG tablet Take 325 mg by mouth daily.    [provider]  atorvastatin (LIPITOR) 40 MG tablet Take 1 tablet (40 mg total) by mouth daily at 6 PM.  09/28/18   Vaughan Basta, MD  Blood Glucose Monitoring Suppl (ACCU-CHEK AVIVA PLUS) w/Device KIT USE AS DIRECTED 01/12/17   Arlis Porta., MD  carvedilol (COREG) 12.5 MG tablet Take 1 tablet (12.5 mg total) by mouth 2 (two) times daily. 09/28/18   Vaughan Basta, MD  clonazePAM (KLONOPIN) 0.5 MG tablet Take 0.5 mg by mouth at bedtime.  07/04/15   [provider]  conjugated estrogens (PREMARIN) vaginal cream Apply 0.66m (pea-sized amount)  just inside the vaginal introitus with a finger-tip on  Monday, Wednesday and Friday nights. 07/20/18   MZara CouncilA, PA-C  estradiol (ESTRACE VAGINAL) 0.1 MG/GM vaginal cream Apply 0.563m(pea-sized amount)  just inside the vaginal introitus with a finger-tip on Monday, Wednesday and Friday nights. Patient not taking: Reported on 09/25/2018 07/20/18   McZara Council, PA-C  Fluticasone-Salmeterol (ADVAIR DISKUS) 250-50 MCG/DOSE AEPB Inhale 1 puff into the lungs 2 (two) times daily. 05/04/17   Karamalegos, AlDevonne DoughtyDO  furosemide (LASIX) 20 MG tablet Take 1 tablet (20 mg total) by mouth daily. 09/29/18   VaVaughan BastaMD  gabapentin (NEURONTIN) 800 MG tablet Take 1 tablet (800 mg total) by mouth 3 (three) times daily. 12/01/16   HaArlis Porta MD  irbesartan-hydrochlorothiazide (AVALIDE) 300-12.5 MG tablet Take 1 tablet by mouth daily.  08/06/17   [provider]  metFORMIN (GLUCOPHAGE) 500 MG tablet TAKE ONE TABLET BY MOUTH EVERY MORNING WITH BREAKFAST Patient taking differently: Take 500 mg by mouth daily.  03/24/17   HaArlis Porta MD  montelukast (SINGULAIR) 10 MG tablet TAKE ONE TABLET BY MOUTH AT BEDTIME 03/24/17   HaArlis Porta MD  ondansetron (ZOFRAN-ODT) 4 MG disintegrating tablet Take 4 mg by mouth every 8 (eight) hours as needed for nausea/vomiting. 08/18/18   [provider]  OXYCONTIN 10 MG 12 hr tablet Take 10 mg by mouth 2 (two) times daily.    [provider]    OXYGEN Inhale 2 L into the lungs continuous.    [provider]  pantoprazole (PROTONIX) 40 MG tablet Take 40 mg by mouth 2 (two) times daily before a meal.  12/08/17 12/08/18  [provider]  polyethylene glycol (MIRALAX / GLYCOLAX) packet Take 17 g by mouth daily.  05/10/18   [provider]  potassium chloride (K-DUR) 10 MEQ tablet Take 1 tablet (10 mEq total) by mouth 2 (two) times daily. 10/12/17   Karamalegos, AlDevonne DoughtyDO  sucralfate (CARAFATE) 1 g tablet Take 1 tablet by mouth 4 (four) times daily -  before meals and at bedtime. 02/15/18 02/15/19  [provider]  tiotropium (SPIRIVA HANDIHALER) 18 MCG inhalation capsule Place 1 capsule (18 mcg total) into inhaler and inhale daily. 08/27/17   Karamalegos, AlDevonne DoughtyDO  valACYclovir (VALTREX) 1000 MG tablet Take 1 tablet (1,000 mg total) by mouth daily as needed (for fever blisters). 04/19/16   KaGladstone LighterMD     Allergies Nitrofurantoin  Family History  Problem Relation Age of Onset  . Hypertension Mother   . Colon cancer Father   . Hypertension Paternal Grandfather   . Bladder Cancer Neg Hx   . Kidney cancer Neg Hx  Social History Social History   Tobacco Use  . Smoking status: Former Smoker    Packs/day: 0.50    Last attempt to quit: 04/28/2015    Years since quitting: 3.5  . Smokeless tobacco: Never Used  Substance Use Topics  . Alcohol use: No    Alcohol/week: 0.0 standard drinks  . Drug use: No    Review of Systems  Constitutional: No fever/chills Eyes: No visual changes.  ENT: No sore throat. Cardiovascular: As above Respiratory: As above Gastrointestinal: No abdominal pain.  No nausea, no vomiting.   Genitourinary: Negative for dysuria. Musculoskeletal: Negative for back pain. Skin: Negative for rash. Neurological: Negative for headaches   ____________________________________________   PHYSICAL EXAM:  VITAL SIGNS: ED Triage Vitals  Enc Vitals  Group     BP 11/06/18 1859 (!) 152/81     Pulse Rate 11/06/18 1856 84     Resp 11/06/18 1900 15     Temp 11/06/18 1856 97.9 F (36.6 C)     Temp Source 11/06/18 1856 Oral     SpO2 11/06/18 1856 99 %     Weight 11/06/18 1857 96.6 kg (212 lb 15.4 oz)     Height --      Head Circumference --      Peak Flow --      Pain Score 11/06/18 1857 0     Pain Loc --      Pain Edu? --      Excl. in [place]? --     Constitutional: Alert and oriented.  Eyes: Conjunctivae are normal.   Nose: No congestion/rhinnorhea. Mouth/Throat: Mucous membranes are moist.    Cardiovascular: Normal rate, regular rhythm. Grossly normal heart sounds.  Good peripheral circulation. Respiratory: Normal respiratory effort.  No retractions.  Bibasilar mild rales Abdomen: No abdominal tenderness palpation, no distention  Musculoskeletal: No lower extremity tenderness nor edema.  Warm and well perfused Neurologic:  Normal speech and language. No gross focal neurologic deficits are appreciated.  Skin:  Skin is warm, dry and intact. No rash noted. Psychiatric: Mood and affect are normal. Speech and behavior are normal.  ____________________________________________   LABS (all labs ordered are listed, but only abnormal results are displayed)  Labs Reviewed  CBC - Abnormal; Notable for the following components:      Result Value   WBC 11.8 (*)    All other components within normal limits  COMPREHENSIVE METABOLIC PANEL - Abnormal; Notable for the following components:   Glucose, Bld 101 (*)    All other components within normal limits  TROPONIN I - Abnormal; Notable for the following components:   Troponin I 0.04 (*)    All other components within normal limits  BRAIN NATRIURETIC PEPTIDE - Abnormal; Notable for the following components:   B Natriuretic Peptide 342.0 (*)    All other components within normal limits    ____________________________________________  EKG    ____________________________________________  RADIOLOGY Chest x-ray shows cardiomegaly, mild vascular congestion ____________________________________________   PROCEDURES  Procedure(s) performed: No  Procedures   Critical Care performed: No ____________________________________________   INITIAL IMPRESSION / ASSESSMENT AND PLAN / ED COURSE  Pertinent labs & imaging results that were available during my care of the patient were reviewed by me and considered in my medical decision making (see chart for details).  Patient presents with shortness of breath, mild chest discomfort.  She is notes her blood pressure is elevated this is likely because of recent blood pressure medication changes.  She does have a  history of CHF, I suspect mild exacerbation causing her shortness of breath.  She becomes markedly winded with any ambulation.  Her troponin is mildly elevated, this appears chronic, not consistent with ACS.  Will admit to the hospital service. Will  give IV Lasix, IV labetalol for blood pressure    ____________________________________________   FINAL CLINICAL IMPRESSION(S) / ED DIAGNOSES  Final diagnoses:  Acute on chronic congestive heart failure, unspecified heart failure type [facility])        Note:  This document was prepared using Dragon voice recognition software and may include unintentional dictation errors.      Lavonia Drafts, MD 11/06/18 2109

## 2018-11-06 NOTE — ED Notes (Signed)
Pt is given a boxed lunch and cup of iced water. Ok'd by Dr. Corky Downs

## 2018-11-06 NOTE — ED Notes (Signed)
Lab called with a critical trop of 0.04. Dr. Corky Downs made aware.

## 2018-11-06 NOTE — ED Triage Notes (Signed)
PT arrives via [facility] from Home with daughter. Pt had recent change in BP meds and was told by PCP to check BP 3 times a day. Pt checked it and the it was 180/90 on left arm EMS got 222/110 right arm . Pt arrives with 20 g in the left forearm. 1 inch of nitro was placed and pt current bp is 144/89.  Pt is on 2L chronically.

## 2018-11-06 NOTE — ED Notes (Signed)
Pt given a warm blanket at this time.

## 2018-11-07 LAB — BASIC METABOLIC PANEL
Anion gap: 8 (ref 5–15)
BUN: 27 mg/dL — AB (ref 8–23)
CHLORIDE: 101 mmol/L (ref 98–111)
CO2: 32 mmol/L (ref 22–32)
Calcium: 9 mg/dL (ref 8.9–10.3)
Creatinine, Ser: 0.98 mg/dL (ref 0.44–1.00)
GFR calc Af Amer: >60 mL/min (ref >60)
GFR calc non Af Amer: 54 mL/min — ABNORMAL LOW (ref >60)
Glucose, Bld: 128 mg/dL — ABNORMAL HIGH (ref 70–99)
Potassium: 3.8 mmol/L (ref 3.5–5.1)
SODIUM: 141 mmol/L (ref 135–145)

## 2018-11-07 LAB — GLUCOSE, CAPILLARY
Glucose-Capillary: 111 mg/dL — ABNORMAL HIGH (ref 70–99)
Glucose-Capillary: 163 mg/dL — ABNORMAL HIGH (ref 70–99)
Glucose-Capillary: 121 mg/dL — ABNORMAL HIGH (ref 70–99)
Glucose-Capillary: 114 mg/dL — ABNORMAL HIGH (ref 70–99)

## 2018-11-07 LAB — CBC
HEMATOCRIT: 38.7 % (ref 36.0–46.0)
Hemoglobin: 11.6 g/dL — ABNORMAL LOW (ref 12.0–15.0)
MCH: 27.6 pg (ref 26.0–34.0)
MCHC: 30 g/dL (ref 30.0–36.0)
MCV: 91.9 fL (ref 80.0–100.0)
Platelets: 219 10*3/uL (ref 150–400)
RBC: 4.21 MIL/uL (ref 3.87–5.11)
RDW: 14.6 % (ref 11.5–15.5)
WBC: 11.6 10*3/uL — AB (ref 4.0–10.5)
nRBC: 0 % (ref 0.0–0.2)

## 2018-11-07 MED ORDER — IRBESARTAN 150 MG PO TABS
75.0000 mg | ORAL_TABLET | Freq: Every day | ORAL | Status: DC
  Administered 2018-11-08: 75 mg via ORAL
  Filled 2018-11-07: qty 1

## 2018-11-07 NOTE — Progress Notes (Signed)
[facility]  ROUNDING NOTE   Subjective:  Patient known to [place] from the office. She was last seen in the office on 10/27/18. Comes in now with chest pain, shortness of breath, and elevated blood pressure. She was recently taken off of losartan and was on carvedilol 12.5 mg twice a day.   Objective:  Vital signs in last 24 hours:  Temp:  [97.8 F (36.6 C)-98.5 F (36.9 C)] 98.5 F (36.9 C) (11/11 0806) Pulse Rate:  [66-84] 75 (11/11 0806) Resp:  [14-23] 17 (11/11 0513) BP: (100-174)/(61-96) 129/76 (11/11 0806) SpO2:  [92 %-99 %] 94 % (11/11 0806) Weight:  [94.5 kg-96.6 kg] 94.5 kg (11/10 2244)  Weight change:  Filed Weights   11/06/18 1857 11/06/18 2244  Weight: 96.6 kg 94.5 kg    Intake/Output: I/O last 3 completed shifts: In: 0  Out: 400 [Urine:400]   Intake/Output this shift:  Total I/O In: -  Out: 1400 [Urine:1400]  Physical Exam: General: No acute distress  Head: Normocephalic, atraumatic. Moist oral mucosal membranes  Eyes: Anicteric  Neck: Supple, trachea midline  Lungs:  Mild basilar rales  Heart: S1S2 no rubs  Abdomen:  Soft, nontender, bowel sounds present  Extremities: trace peripheral edema.  Neurologic: Awake, alert, following commands  Skin: No lesions       Basic Metabolic Panel: Recent Labs  Lab 11/06/18 1905 11/07/18 0504  NA 141 141  K 4.1 3.8  CL 102 101  CO2 31 32  GLUCOSE 101* 128*  BUN 23 27*  CREATININE 0.77 0.98  CALCIUM 9.4 9.0    Liver Function Tests: Recent Labs  Lab 11/06/18 1905  AST 17  ALT 16  ALKPHOS 60  BILITOT 0.6  PROT 6.9  ALBUMIN 3.9   No results for input(s): LIPASE, AMYLASE in the last 168 hours. No results for input(s): AMMONIA in the last 168 hours.  CBC: Recent Labs  Lab 11/06/18 1905 11/07/18 0504  WBC 11.8* 11.6*  HGB 12.3 11.6*  HCT 40.2 38.7  MCV 91.2 91.9  PLT 223 219    Cardiac Enzymes: Recent Labs  Lab 11/06/18 1905  TROPONINI 0.04*    BNP: Invalid  input(s): POCBNP  CBG: Recent Labs  Lab 11/06/18 2248 11/07/18 0808 11/07/18 1158  GLUCAP 209* 111* 163*    Microbiology: Results for orders placed or performed during the hospital encounter of 04/08/18  Urine Culture     Status: Abnormal   Collection Time: 04/08/18 12:22 PM  Result Value Ref Range Status   Specimen Description   Final    URINE, CLEAN CATCH Performed at [facility], [address] [place], [place] [address]    Special Requests   Final    Normal Performed at [facility], [address] [place], [place] [address]    Culture (A)  Final    >=100,000 COLONIES/mL ESCHERICHIA COLI >=100,000 COLONIES/mL VANCOMYCIN RESISTANT ENTEROCOCCUS    Report Status 04/11/2018 FINAL  Final   Organism ID, Bacteria ESCHERICHIA COLI (A)  Final   Organism ID, Bacteria VANCOMYCIN RESISTANT ENTEROCOCCUS (A)  Final      Susceptibility   Escherichia coli - MIC*    AMPICILLIN >=32 RESISTANT Resistant     CEFAZOLIN 8 SENSITIVE Sensitive     CEFTRIAXONE <=1 SENSITIVE Sensitive     CIPROFLOXACIN >=4 RESISTANT Resistant     GENTAMICIN <=1 SENSITIVE Sensitive     IMIPENEM <=0.25 SENSITIVE Sensitive     NITROFURANTOIN <=16 SENSITIVE Sensitive     TRIMETH/SULFA >=  320 RESISTANT Resistant     AMPICILLIN/SULBACTAM 16 INTERMEDIATE Intermediate     PIP/TAZO 8 SENSITIVE Sensitive     Extended ESBL NEGATIVE Sensitive     * >=100,000 COLONIES/mL ESCHERICHIA COLI   Vancomycin resistant enterococcus - MIC*    AMPICILLIN >=32 RESISTANT Resistant     LEVOFLOXACIN >=8 RESISTANT Resistant     NITROFURANTOIN <=16 SENSITIVE Sensitive     VANCOMYCIN >=32 RESISTANT Resistant     * >=100,000 COLONIES/mL VANCOMYCIN RESISTANT ENTEROCOCCUS    Coagulation Studies: No results for input(s): LABPROT, INR in the last 72 hours.  Urinalysis: No results for input(s): COLORURINE, LABSPEC, PHURINE, GLUCOSEU, HGBUR, BILIRUBINUR, KETONESUR, PROTEINUR, UROBILINOGEN, NITRITE, LEUKOCYTESUR in  the last 72 hours.  Invalid input(s): APPERANCEUR    Imaging: Dg Chest Portable 1 View  Result Date: 11/06/2018 CLINICAL DATA:  Elevated blood pressure. Shortness of breath. EXAM: PORTABLE CHEST 1 VIEW COMPARISON:  09/25/2018. FINDINGS: The heart is enlarged. There is mild vascular congestion. Platelike atelectasis is seen at the RIGHT base. No consolidation or edema. No effusion or pneumothorax. Thoracic atherosclerosis. Osteopenia. IMPRESSION: Cardiomegaly. Mild vascular congestion. No consolidation or edema. Slight improvement aeration compared with priors. Electronically Signed   By: Staci Righter M.D.   On: 11/06/2018 20:09     Medications:    . aspirin EC  325 mg Oral Daily  . carvedilol  12.5 mg Oral BID  . furosemide  20 mg Intravenous Q12H  . gabapentin  800 mg Oral TID  . heparin  5,000 Units Subcutaneous Q8H  . insulin aspart  0-9 Units Subcutaneous TID WC  . irbesartan  300 mg Oral Daily  . metFORMIN  500 mg Oral Q breakfast  . mometasone-formoterol  2 puff Inhalation BID  . montelukast  10 mg Oral QHS  . oxyCODONE  10 mg Oral BID  . pantoprazole  40 mg Oral BID AC  . polyethylene glycol  17 g Oral Daily  . potassium chloride  10 mEq Oral BID  . sucralfate  1 g Oral TID AC & HS  . tiotropium  18 mcg Inhalation Daily   albuterol, clonazePAM, docusate sodium, hydrALAZINE, ondansetron, valACYclovir  Assessment/ Plan:  77 y.o. female with COPD, hypertension, diabetes mellitus type II, obstructive sleep apnea, pulmonary hypertension, congestive heart failure, depression  1.  Hypertension, blood pressure now under good control. 2.  [name] stage II/Proteinuria. 3.  Chest pain/Chronic systolic heart failure.   Plan:  The patient presented with significantly elevated blood pressure.  She was recently taken off of irbesartan and HCTZ secondary to hypotension when she presented as an outpatient.  However this a.m. Blood pressures under good control at 129/76.  Question as to  whether there's been some salt indiscretion.  We're hesitant to add back irbesartan at thsi time.    Continue to monitor blood pressure trend.  Thanks for consultation.   LOS: 1 Duward Allbritton 11/11/201912:48 PM

## 2018-11-07 NOTE — Progress Notes (Signed)
Patient ID: Megan Bolton, female   DOB: 1941-04-09, 77 y.o.   MRN: [id number]  [employer] PROGRESS NOTE  AUNNA SNOOKS YPP:[id number] DOB: 1941-11-06 DOA: 11/06/2018 PCP: Baxter Hire, MD  HPI/Subjective: Patient feeling a little bit better than when she came in.  She is been complaining of shortness of breath for 2 days.  She has had some headache.  She is feels fuzzy and dizzy.  She has had some tightness in the upper chest.  She states her blood pressure is been up.  Objective: Vitals:   11/07/18 0806 11/07/18 1624  BP: 129/76 (!) 155/94  Pulse: 75 78  Resp:    Temp: 98.5 F (36.9 C) 99.1 F (37.3 C)  SpO2: 94% 93%    Filed Weights   11/06/18 1857 11/06/18 2244  Weight: 96.6 kg 94.5 kg    ROS: Review of Systems  Constitutional: Negative for chills and fever.  Eyes: Negative for blurred vision.  Respiratory: Positive for cough and shortness of breath.   Cardiovascular: Negative for chest pain.  Gastrointestinal: Negative for abdominal pain, constipation, diarrhea, nausea and vomiting.  Genitourinary: Negative for dysuria.  Musculoskeletal: Negative for joint pain.  Neurological: Negative for dizziness and headaches.   Exam: Physical Exam  Constitutional: She is oriented to person, place, and time.  HENT:  Nose: No mucosal edema.  Mouth/Throat: No oropharyngeal exudate or posterior oropharyngeal edema.  Eyes: Pupils are equal, round, and reactive to light. Conjunctivae, EOM and lids are normal.  Neck: No JVD present. Carotid bruit is not present. No edema present. No thyroid mass and no thyromegaly present.  Cardiovascular: S1 normal and S2 normal. Exam reveals no gallop.  No murmur heard. Pulses:      Dorsalis pedis pulses are 2+ on the right side, and 2+ on the left side.  Respiratory: No respiratory distress. She has decreased breath sounds in the right lower field and the left lower field. She has no wheezes. She has no rhonchi. She has rales in  the right lower field and the left lower field.  GI: Soft. Bowel sounds are normal. There is no tenderness.  Musculoskeletal:       Right ankle: She exhibits swelling.       Left ankle: She exhibits swelling.  Lymphadenopathy:    She has no cervical adenopathy.  Neurological: She is alert and oriented to person, place, and time. No cranial nerve deficit.  Skin: Skin is warm. No rash noted. Nails show no clubbing.  Psychiatric: She has a normal mood and affect.      Data Reviewed: Basic Metabolic Panel: Recent Labs  Lab 11/06/18 1905 11/07/18 0504  NA 141 141  K 4.1 3.8  CL 102 101  CO2 31 32  GLUCOSE 101* 128*  BUN 23 27*  CREATININE 0.77 0.98  CALCIUM 9.4 9.0   Liver Function Tests: Recent Labs  Lab 11/06/18 1905  AST 17  ALT 16  ALKPHOS 60  BILITOT 0.6  PROT 6.9  ALBUMIN 3.9   CBC: Recent Labs  Lab 11/06/18 1905 11/07/18 0504  WBC 11.8* 11.6*  HGB 12.3 11.6*  HCT 40.2 38.7  MCV 91.2 91.9  PLT 223 219   Cardiac Enzymes: Recent Labs  Lab 11/06/18 1905  TROPONINI 0.04*   BNP (last 3 results) Recent Labs    09/25/18 1330 09/27/18 0540 11/06/18 1905  BNP 531.0* 421.0* 342.0*     CBG: Recent Labs  Lab 11/06/18 2248 11/07/18 0808 11/07/18 1158  GLUCAP 209*  111* 163*      Studies: Dg Chest Portable 1 View  Result Date: 11/06/2018 CLINICAL DATA:  Elevated blood pressure. Shortness of breath. EXAM: PORTABLE CHEST 1 VIEW COMPARISON:  09/25/2018. FINDINGS: The heart is enlarged. There is mild vascular congestion. Platelike atelectasis is seen at the RIGHT base. No consolidation or edema. No effusion or pneumothorax. Thoracic atherosclerosis. Osteopenia. IMPRESSION: Cardiomegaly. Mild vascular congestion. No consolidation or edema. Slight improvement aeration compared with priors. Electronically Signed   By: Staci Righter M.D.   On: 11/06/2018 20:09    Scheduled Meds: . aspirin EC  325 mg Oral Daily  . carvedilol  12.5 mg Oral BID  .  furosemide  20 mg Intravenous Q12H  . gabapentin  800 mg Oral TID  . heparin  5,000 Units Subcutaneous Q8H  . insulin aspart  0-9 Units Subcutaneous TID WC  . [START ON 11/08/2018] irbesartan  75 mg Oral Daily  . metFORMIN  500 mg Oral Q breakfast  . mometasone-formoterol  2 puff Inhalation BID  . montelukast  10 mg Oral QHS  . oxyCODONE  10 mg Oral BID  . pantoprazole  40 mg Oral BID AC  . polyethylene glycol  17 g Oral Daily  . potassium chloride  10 mEq Oral BID  . sucralfate  1 g Oral TID AC & HS  . tiotropium  18 mcg Inhalation Daily   Continuous Infusions:  Assessment/Plan:  1. Acute on chronic diastolic congestive heart failure.  Continue IV Lasix patient on Coreg and irbesartan. 2. Uncontrolled hypertension.  Blood pressure looks better controlled today.  Check orthostatic vital signs. 3. Type 2 diabetes mellitus on metformin 4. COPD on chronic oxygen with chronic respiratory failure continue 2 L of oxygen in usual inhalers.  Code Status:     Code Status Orders  (From admission, onward)         Start     Ordered   11/06/18 2237  Full code  Continuous     11/06/18 2236        Code Status History    Date Active Date Inactive Code Status Order ID Comments User Context   09/25/2018 2129 09/28/2018 1737 Full Code [id number]  Sela Hua, MD Inpatient   05/25/2018 0527 05/25/2018 1825 Full Code [id number]  Arta Silence, MD Inpatient   12/19/2017 0514 12/19/2017 1813 Full Code [id number]  Saundra Shelling, MD Inpatient   08/30/2017 1021 08/31/2017 1500 Full Code [id number]  Bettey Costa, MD Inpatient   04/18/2016 0142 04/19/2016 1732 Full Code [id number]  Lance Coon, MD ED   02/28/2016 0540 03/01/2016 1739 Full Code [id number]  Saundra Shelling, MD Inpatient    Advance Directive Documentation     Most Recent Value  Type of Advance Directive  Living will  Pre-existing out of facility DNR order (yellow form or pink MOST form)  -  "MOST" Form in Place?  -     Family  Communication: Family at the bedside Disposition Plan: To be determined  Consultants:  Nephrology  Time spent: 35 minutes  [place]

## 2018-11-07 NOTE — Plan of Care (Signed)

## 2018-11-07 NOTE — Plan of Care (Signed)
  Problem: Education: Goal: Knowledge of General Education information will improve Description Including pain rating scale, medication(s)/side effects and non-pharmacologic comfort measures 11/07/2018 1612 by [name], RN Outcome: Progressing 11/07/2018 1608 by [name], RN Outcome: Progressing   Problem: Health Behavior/Discharge Planning: Goal: Ability to manage health-related needs will improve 11/07/2018 1612 by [name], RN Outcome: Progressing 11/07/2018 1608 by [name], RN Outcome: Progressing   Problem: Clinical Measurements: Goal: Ability to maintain clinical measurements within normal limits will improve Outcome: Progressing   Problem: Education: Goal: Ability to demonstrate management of disease process will improve Outcome: Progressing Goal: Ability to verbalize understanding of medication therapies will improve Outcome: Progressing

## 2018-11-08 ENCOUNTER — Inpatient Hospital Stay: Payer: Medicare HMO

## 2018-11-08 LAB — BASIC METABOLIC PANEL
Anion gap: 7 (ref 5–15)
BUN: 39 mg/dL — ABNORMAL HIGH (ref 8–23)
CHLORIDE: 103 mmol/L (ref 98–111)
CO2: 31 mmol/L (ref 22–32)
CREATININE: 1.17 mg/dL — AB (ref 0.44–1.00)
Calcium: 8.9 mg/dL (ref 8.9–10.3)
GFR calc non Af Amer: 44 mL/min — ABNORMAL LOW (ref >60)
GFR, EST AFRICAN AMERICAN: 51 mL/min — AB (ref >60)
Glucose, Bld: 116 mg/dL — ABNORMAL HIGH (ref 70–99)
POTASSIUM: 3.8 mmol/L (ref 3.5–5.1)
SODIUM: 141 mmol/L (ref 135–145)

## 2018-11-08 LAB — GLUCOSE, CAPILLARY
GLUCOSE-CAPILLARY: 125 mg/dL — AB (ref 70–99)
GLUCOSE-CAPILLARY: 152 mg/dL — AB (ref 70–99)
GLUCOSE-CAPILLARY: 111 mg/dL — AB (ref 70–99)
Glucose-Capillary: 125 mg/dL — ABNORMAL HIGH (ref 70–99)

## 2018-11-08 LAB — URINALYSIS, COMPLETE (UACMP) WITH MICROSCOPIC
Bilirubin Urine: NEGATIVE
Glucose, UA: NEGATIVE mg/dL
Hgb urine dipstick: NEGATIVE
KETONES UR: NEGATIVE mg/dL
Nitrite: POSITIVE — AB
PH: 6 (ref 5.0–8.0)
PROTEIN: NEGATIVE mg/dL
Specific Gravity, Urine: 1.021 (ref 1.005–1.030)

## 2018-11-08 LAB — CALCIUM, IONIZED: Calcium, Ionized, Serum: 4.8 mg/dL (ref 4.5–5.6)

## 2018-11-08 MED ORDER — ACETAMINOPHEN 325 MG PO TABS
650.0000 mg | ORAL_TABLET | Freq: Four times a day (QID) | ORAL | Status: DC | PRN
  Administered 2018-11-08: 650 mg via ORAL
  Filled 2018-11-08: qty 2

## 2018-11-08 MED ORDER — IOPAMIDOL (ISOVUE-370) INJECTION 76%
75.0000 mL | Freq: Once | INTRAVENOUS | Status: AC | PRN
  Administered 2018-11-08: 75 mL via INTRAVENOUS

## 2018-11-08 MED ORDER — FUROSEMIDE 10 MG/ML IJ SOLN
20.0000 mg | Freq: Every day | INTRAMUSCULAR | Status: DC
  Administered 2018-11-09: 20 mg via INTRAVENOUS
  Filled 2018-11-08: qty 2

## 2018-11-08 NOTE — Progress Notes (Signed)
[facility]  ROUNDING NOTE   Subjective:  Renal function remained stable. Creatinine 1.2. Blood pressure currently under good control at 129/73.   Objective:  Vital signs in last 24 hours:  Temp:  [98.6 F (37 C)-99.1 F (37.3 C)] 98.7 F (37.1 C) (11/12 0848) Pulse Rate:  [71-84] 80 (11/12 0848) Resp:  [15-18] 15 (11/12 0848) BP: (116-156)/(67-94) 129/73 (11/12 0848) SpO2:  [90 %-94 %] 92 % (11/12 0848) Weight:  [93.8 kg] 93.8 kg (11/12 0504)  Weight change: -2.842 kg Filed Weights   11/06/18 1857 11/06/18 2244 11/08/18 0504  Weight: 96.6 kg 94.5 kg 93.8 kg    Intake/Output: I/O last 3 completed shifts: In: 0  Out: 2700 [Urine:2700]   Intake/Output this shift:  No intake/output data recorded.  Physical Exam: General: No acute distress  Head: Normocephalic, atraumatic. Moist oral mucosal membranes  Eyes: Anicteric  Neck: Supple, trachea midline  Lungs:  Mild basilar rales, normal effort  Heart: S1S2 no rubs  Abdomen:  Soft, nontender, bowel sounds present  Extremities: trace peripheral edema.  Neurologic: Awake, alert, following commands  Skin: No lesions       Basic Metabolic Panel: Recent Labs  Lab 11/06/18 1905 11/07/18 0504 11/08/18 0539  NA 141 141 141  K 4.1 3.8 3.8  CL 102 101 103  CO2 31 32 31  GLUCOSE 101* 128* 116*  BUN 23 27* 39*  CREATININE 0.77 0.98 1.17*  CALCIUM 9.4 9.0 8.9    Liver Function Tests: Recent Labs  Lab 11/06/18 1905  AST 17  ALT 16  ALKPHOS 60  BILITOT 0.6  PROT 6.9  ALBUMIN 3.9   No results for input(s): LIPASE, AMYLASE in the last 168 hours. No results for input(s): AMMONIA in the last 168 hours.  CBC: Recent Labs  Lab 11/06/18 1905 11/07/18 0504  WBC 11.8* 11.6*  HGB 12.3 11.6*  HCT 40.2 38.7  MCV 91.2 91.9  PLT 223 219    Cardiac Enzymes: Recent Labs  Lab 11/06/18 1905  TROPONINI 0.04*    BNP: Invalid input(s): POCBNP  CBG: Recent Labs  Lab 11/07/18 1158 11/07/18 1649  11/07/18 2110 11/08/18 0817 11/08/18 1133  GLUCAP 163* 121* 114* 125* 152*    Microbiology: Results for orders placed or performed during the hospital encounter of 04/08/18  Urine Culture     Status: Abnormal   Collection Time: 04/08/18 12:22 PM  Result Value Ref Range Status   Specimen Description   Final    URINE, CLEAN CATCH Performed at [facility], [address] [place], [place] [address]    Special Requests   Final    Normal Performed at [facility], [address] [place],  [address]    Culture (A)  Final    >=100,000 COLONIES/mL ESCHERICHIA COLI >=100,000 COLONIES/mL VANCOMYCIN RESISTANT ENTEROCOCCUS    Report Status 04/11/2018 FINAL  Final   Organism ID, Bacteria ESCHERICHIA COLI (A)  Final   Organism ID, Bacteria VANCOMYCIN RESISTANT ENTEROCOCCUS (A)  Final      Susceptibility   Escherichia coli - MIC*    AMPICILLIN >=32 RESISTANT Resistant     CEFAZOLIN 8 SENSITIVE Sensitive     CEFTRIAXONE <=1 SENSITIVE Sensitive     CIPROFLOXACIN >=4 RESISTANT Resistant     GENTAMICIN <=1 SENSITIVE Sensitive     IMIPENEM <=0.25 SENSITIVE Sensitive     NITROFURANTOIN <=16 SENSITIVE Sensitive     TRIMETH/SULFA >=320 RESISTANT Resistant     AMPICILLIN/SULBACTAM 16 INTERMEDIATE Intermediate  PIP/TAZO 8 SENSITIVE Sensitive     Extended ESBL NEGATIVE Sensitive     * >=100,000 COLONIES/mL ESCHERICHIA COLI   Vancomycin resistant enterococcus - MIC*    AMPICILLIN >=32 RESISTANT Resistant     LEVOFLOXACIN >=8 RESISTANT Resistant     NITROFURANTOIN <=16 SENSITIVE Sensitive     VANCOMYCIN >=32 RESISTANT Resistant     * >=100,000 COLONIES/mL VANCOMYCIN RESISTANT ENTEROCOCCUS    Coagulation Studies: No results for input(s): LABPROT, INR in the last 72 hours.  Urinalysis: No results for input(s): COLORURINE, LABSPEC, PHURINE, GLUCOSEU, HGBUR, BILIRUBINUR, KETONESUR, PROTEINUR, UROBILINOGEN, NITRITE, LEUKOCYTESUR in the last 72 hours.  Invalid  input(s): APPERANCEUR    Imaging: Ct Angio Chest Pe W Or Wo Contrast  Result Date: 11/08/2018 CLINICAL DATA:  Shortness of breath and chest tightness EXAM: CT ANGIOGRAPHY CHEST WITH CONTRAST TECHNIQUE: Multidetector CT imaging of the chest was performed using the standard protocol during bolus administration of intravenous contrast. Multiplanar CT image reconstructions and MIPs were obtained to evaluate the vascular anatomy. CONTRAST:  30mL ISOVUE-370 IOPAMIDOL (ISOVUE-370) INJECTION 76% COMPARISON:  Chest CT May 24, 2018; chest radiograph November 06, 2018 FINDINGS: Cardiovascular: There is no demonstrable pulmonary embolus. There is no thoracic aortic aneurysm or dissection. There is calcification at the origin of the left subclavian artery. Other visualized great vessels appear unremarkable. There is aortic atherosclerosis. There are foci of coronary artery calcification evident. There is a small amount of pericardial fluid, within the physiologic range, stable. Pericardium does not appear appreciably thickened. Prominence of the main pulmonary outflow tract is noted with a measured diameter of 3.7 cm. Mediastinum/Nodes: There is a subcentimeter nodular opacity in the left lobe of the thyroid, stable. There are multiple subcentimeter mediastinal lymph nodes again noted. There are prominent right paratracheal lymph nodes with largest lymph node in this area measuring 1.8 x 1.4 cm, marginally smaller than on prior study. Aortopulmonary window lymph nodes appear slightly smaller than on previous study. No new lymph node prominence is evident on this study. No esophageal lesions are evident. Lungs/Pleura: There is atelectatic change in the lung base regions, stable. There is no frank edema or consolidation. A mild degree of centrilobular emphysematous change appears stable. No appreciable pleural effusion. Upper Abdomen: There is a cyst in the medial upper pole right kidney measuring 1.5 x 1.5 cm. A cyst is  noted in the lateral mid left kidney measuring 1.5 x 1.3 cm. There is aortic atherosclerosis as well as calcification in the proximal visualized great vessels. Visualized upper abdominal structures otherwise appear unremarkable. Musculoskeletal: There is stable anterior wedging of the L1 vertebral body. There is multilevel degenerative change in the thoracic spine which appear stable. No blastic or lytic bone lesions are evident. No evident chest wall lesions. Review of the MIP images confirms the above findings. IMPRESSION: 1. No demonstrable pulmonary embolus. No thoracic aortic aneurysm or dissection. There is aortic atherosclerosis. There are foci of great vessel and major mesenteric arterial calcification with foci of coronary artery calcification noted as well. 2. Prominence of the main pulmonary outflow tract, a finding felt to be indicative of a degree of pulmonary arterial hypertension. 3. Note that there is calcification in both proximal renal arteries. In this regard, question whether patient is hypertensive due to renal artery stenosis. 4. There is mild centrilobular emphysematous change, stable. There is atelectatic change, primarily in the bases. No frank edema or consolidation. 5. Prominent paratracheal lymph nodes, marginally smaller than on previous study. No new lymph node prominence compared  to previous study. 6. Small amount of pericardial fluid, likely in the physiologic range. Aortic Atherosclerosis (ICD10-I70.0) and Emphysema (ICD10-J43.9). Electronically Signed   By: Lowella Grip III M.D.   On: 11/08/2018 11:34   Dg Chest Portable 1 View  Result Date: 11/06/2018 CLINICAL DATA:  Elevated blood pressure. Shortness of breath. EXAM: PORTABLE CHEST 1 VIEW COMPARISON:  09/25/2018. FINDINGS: The heart is enlarged. There is mild vascular congestion. Platelike atelectasis is seen at the RIGHT base. No consolidation or edema. No effusion or pneumothorax. Thoracic atherosclerosis. Osteopenia.  IMPRESSION: Cardiomegaly. Mild vascular congestion. No consolidation or edema. Slight improvement aeration compared with priors. Electronically Signed   By: Staci Righter M.D.   On: 11/06/2018 20:09     Medications:    . aspirin EC  325 mg Oral Daily  . carvedilol  12.5 mg Oral BID  . furosemide  20 mg Intravenous Q12H  . gabapentin  800 mg Oral TID  . heparin  5,000 Units Subcutaneous Q8H  . insulin aspart  0-9 Units Subcutaneous TID WC  . irbesartan  75 mg Oral Daily  . metFORMIN  500 mg Oral Q breakfast  . mometasone-formoterol  2 puff Inhalation BID  . montelukast  10 mg Oral QHS  . oxyCODONE  10 mg Oral BID  . pantoprazole  40 mg Oral BID AC  . polyethylene glycol  17 g Oral Daily  . potassium chloride  10 mEq Oral BID  . sucralfate  1 g Oral TID AC & HS  . tiotropium  18 mcg Inhalation Daily   albuterol, clonazePAM, docusate sodium, hydrALAZINE, ondansetron, valACYclovir  Assessment/ Plan:  77 y.o. female with COPD, hypertension, diabetes mellitus type II, obstructive sleep apnea, pulmonary hypertension, congestive heart failure, depression  1.  Hypertension, blood pressure now under good control. 2.  [name] stage II/Proteinuria. 3.  Chest pain/Chronic systolic heart failure.   Plan:  Blood pressure under good control at the moment.  Continue carvedilol and irbesartan at their current doses.  Ok to continue lasix as well but monitor renal parameters closely while on this regimen.  Otherwise treatment as per hospitalist.    LOS: 2 Megan Bolton 11/12/20191:13 PM

## 2018-11-08 NOTE — Evaluation (Signed)
Physical Therapy Evaluation Patient Details Name: Megan Bolton MRN: [id number] DOB: 1941-10-01 Today's Date: 11/08/2018   History of Present Illness   77 y.o. female with a known history of CHF, COPD, diabetes, hypertension, hyperlipidemia,  admitted for mild chest tightness and some shortness of breath.   Clinical Impression  Patient alert and eager to mobilize at start of session. Reported living with family in one story home, independent with ADLs, ambulates household distances with rollator, very limited community ambulation. Family performs IADLs.  Upon assessment patient ambulated with RW in room ~74ft with supervision/CGA. Able to ambulate without RW, but improved safety/steadiness with RW. Patient able to perform transfers with use of chair arms, mod I. Patient demonstrated limitations in higher level balance activities as well as decreased endurance/activity tolerance exhibited with 5x sit to stand time (18seconds). The patient would benefit from skilled PT to address limitations in endurance, mobility, activity tolerance and balance.     Follow Up Recommendations Home health PT    Equipment Recommendations  None recommended by PT;Other (comment)(Pt reports rollator and RW at home, handicap accessible bathroom)    Recommendations for Other Services       Precautions / Restrictions Precautions Precautions: Fall Restrictions Weight Bearing Restrictions: No      Mobility  Bed Mobility               General bed mobility comments: deferred pt sitting EOB  Transfers Overall transfer level: Modified independent Equipment used: Rolling walker (2 wheeled)                Ambulation/Gait Ambulation/Gait assistance: Supervision Gait Distance (Feet): 45 Feet Assistive device: None;Rolling walker (2 wheeled)       General Gait Details: Pt able to ambulate in room without RW, reaches for support, improvement in gait speed and steadiness noted with RW.    Stairs            Wheelchair Mobility    Modified Rankin (Stroke Patients Only)       Balance Overall balance assessment: Needs assistance Sitting-balance support: Feet supported Sitting balance-Leahy Scale: Good       Standing balance-Leahy Scale: Fair   Single Leg Stance - Right Leg: 2 Single Leg Stance - Left Leg: 1 Tandem Stance - Right Leg: 10                       Pertinent Vitals/Pain Pain Assessment: No/denies pain    Home Living Family/patient expects to be discharged to:: Private residence Living Arrangements: Children Available Help at Discharge: Family Type of Home: House Home Access: Stairs to enter Entrance Stairs-Rails: Psychiatric nurse of Steps: 3 Home Layout: One level Home Equipment: Environmental consultant - 4 wheels;Shower seat;Grab bars - tub/shower;Walker - 2 wheels      Prior Function Level of Independence: Independent with assistive device(s)         Comments: independent in ADLs, primarily uses rollator in home, very limited community ambulator (MD appts), family performs IADLs.     Hand Dominance        Extremity/Trunk Assessment   Upper Extremity Assessment Upper Extremity Assessment: Overall WFL for tasks assessed    Lower Extremity Assessment Lower Extremity Assessment: Generalized weakness       Communication   Communication: No difficulties  Cognition Arousal/Alertness: Awake/alert Behavior During Therapy: WFL for tasks assessed/performed Overall Cognitive Status: Within Functional Limits for tasks assessed  General Comments General comments (skin integrity, edema, etc.): Pt performed 5 sit to stands in 18 seconds without use of UE.    Exercises     Assessment/Plan    PT Assessment Patient needs continued PT services  PT Problem List Decreased strength;Decreased range of motion;Decreased knowledge of use of DME;Decreased activity  tolerance;Decreased balance;Decreased mobility;Cardiopulmonary status limiting activity       PT Treatment Interventions DME instruction;Balance training;Gait training;Neuromuscular re-education;Stair training;Functional mobility training;Patient/family education;Therapeutic activities;Therapeutic exercise    PT Goals (Current goals can be found in the Care Plan section)  Acute Rehab PT Goals Patient Stated Goal: to get stronger PT Goal Formulation: With patient Time For Goal Achievement: 11/22/18 Potential to Achieve Goals: Good Additional Goals Additional Goal #1: Patient will demonstrate single leg stance bilterally for at [place] 10seconds to demonstrate decreased risk of falls.    Frequency Min 2X/week   Barriers to discharge        Co-evaluation               AM-PAC PT "6 Clicks" Daily Activity  Outcome Measure Difficulty turning over in bed (including adjusting bedclothes, sheets and blankets)?: None Difficulty moving from lying on back to sitting on the side of the bed? : None Difficulty sitting down on and standing up from a chair with arms (e.g., wheelchair, bedside commode, etc,.)?: A Little Help needed moving to and from a bed to chair (including a wheelchair)?: A Little Help needed walking in hospital room?: A Little Help needed climbing 3-5 steps with a railing? : A Little 6 Click Score: 20    End of Session Equipment Utilized During Treatment: Gait belt Activity Tolerance: Patient tolerated treatment well Patient left: in bed;Other (comment)(transport at bedside) Nurse Communication: Mobility status PT Visit Diagnosis: Unsteadiness on feet (R26.81);Muscle weakness (generalized) (M62.81);Other abnormalities of gait and mobility (R26.89)    Time: 5883-2549 PT Time Calculation (min) (ACUTE ONLY): 16 min   Charges:   PT Evaluation $PT Eval Moderate Complexity: 1 Mod          Lieutenant Diego PT, DPT 1:13 PM,11/08/18 [phone]

## 2018-11-08 NOTE — Care Management Note (Signed)
Case Management Note  Patient Details  Name: Megan Bolton MRN: [id number] Date of Birth: 1941-03-15  Subjective/Objective:      Patient is from home with daughter.  Admitted with elevated blood pressure and acute on chronic CHF. She has chronic O2 at home at 2L and has portable tank at bedside.  She is currently on 2L.  CT of chest completed today.  Open to [facility] for home health.   Notified Helene Kelp with [facility] and adding heart failure protocol to home health services.  Protocol on chart; needs MD signature.  Has a new patient appointment with the heart failure clinic.  Text paged MD for signature.  Denies issues accessing medical care, obtaining medications or with transportation.  Current with PCP.  No discharge needs identified at present by care manager or members of care team.                  Action/Plan:   Expected Discharge Date:                  Expected Discharge Plan:  [place]  In-House Referral:     Discharge planning Services  CM Consult  Post Acute Care Choice:  Resumption of Svcs/PTA Provider Choice offered to:     DME Arranged:    DME Agency:     HH Arranged:    [place] Agency:  [facility] [employer]  Status of Service:  In process, will continue to follow  If discussed at Long Length of Stay Meetings, dates discussed:    Additional Comments:  Elza Rafter, RN 11/08/2018, 2:40 PM

## 2018-11-08 NOTE — Progress Notes (Signed)
Patient ID: Megan Bolton, female   DOB: [birth year], 77 y.o.   MRN: [id number]  [facility] PROGRESS NOTE  Megan Bolton ACZ:[id number] DOB: Jul 22, 1941 DOA: 11/06/2018 PCP: Baxter Hire, MD  HPI/Subjective: Patient still feeling a little shortness of breath when she is moving around.  States that her swelling is a little bit better.  Breathing fine at rest.  On the monitor had a few beats of atrial tachycardia.  Objective: Vitals:   11/08/18 0504 11/08/18 0848  BP: 117/74 129/73  Pulse: 74 80  Resp: 17 15  Temp: 98.6 F (37 C) 98.7 F (37.1 C)  SpO2: 90% 92%    Filed Weights   11/06/18 1857 11/06/18 2244 11/08/18 0504  Weight: 96.6 kg 94.5 kg 93.8 kg    ROS: Review of Systems  Constitutional: Negative for chills and fever.  Eyes: Negative for blurred vision.  Respiratory: Positive for cough and shortness of breath.   Cardiovascular: Negative for chest pain.  Gastrointestinal: Negative for abdominal pain, constipation, diarrhea, nausea and vomiting.  Genitourinary: Negative for dysuria.  Musculoskeletal: Negative for joint pain.  Neurological: Negative for dizziness and headaches.   Exam: Physical Exam  Constitutional: She is oriented to person, place, and time.  HENT:  Nose: No mucosal edema.  Mouth/Throat: No oropharyngeal exudate or posterior oropharyngeal edema.  Eyes: Pupils are equal, round, and reactive to light. Conjunctivae, EOM and lids are normal.  Neck: No JVD present. Carotid bruit is not present. No edema present. No thyroid mass and no thyromegaly present.  Cardiovascular: S1 normal and S2 normal. Exam reveals no gallop.  No murmur heard. Pulses:      Dorsalis pedis pulses are 2+ on the right side, and 2+ on the left side.  Respiratory: No respiratory distress. She has decreased breath sounds in the right lower field and the left lower field. She has no wheezes. She has no rhonchi. She has rales in the right lower field and the left lower  field.  GI: Soft. Bowel sounds are normal. There is no tenderness.  Musculoskeletal:       Right ankle: She exhibits swelling.       Left ankle: She exhibits swelling.  Lymphadenopathy:    She has no cervical adenopathy.  Neurological: She is alert and oriented to person, place, and time. No cranial nerve deficit.  Skin: Skin is warm. No rash noted. Nails show no clubbing.  Psychiatric: She has a normal mood and affect.      Data Reviewed: Basic Metabolic Panel: Recent Labs  Lab 11/06/18 1905 11/07/18 0504 11/08/18 0539  NA 141 141 141  K 4.1 3.8 3.8  CL 102 101 103  CO2 31 32 31  GLUCOSE 101* 128* 116*  BUN 23 27* 39*  CREATININE 0.77 0.98 1.17*  CALCIUM 9.4 9.0 8.9   Liver Function Tests: Recent Labs  Lab 11/06/18 1905  AST 17  ALT 16  ALKPHOS 60  BILITOT 0.6  PROT 6.9  ALBUMIN 3.9   CBC: Recent Labs  Lab 11/06/18 1905 11/07/18 0504  WBC 11.8* 11.6*  HGB 12.3 11.6*  HCT 40.2 38.7  MCV 91.2 91.9  PLT 223 219   Cardiac Enzymes: Recent Labs  Lab 11/06/18 1905  TROPONINI 0.04*   BNP (last 3 results) Recent Labs    09/25/18 1330 09/27/18 0540 11/06/18 1905  BNP 531.0* 421.0* 342.0*     CBG: Recent Labs  Lab 11/07/18 1158 11/07/18 1649 11/07/18 2110 11/08/18 0817 11/08/18 1133  GLUCAP  163* 121* 114* 125* 152*      Studies: Ct Angio Chest Pe W Or Wo Contrast  Result Date: 11/08/2018 CLINICAL DATA:  Shortness of breath and chest tightness EXAM: CT ANGIOGRAPHY CHEST WITH CONTRAST TECHNIQUE: Multidetector CT imaging of the chest was performed using the standard protocol during bolus administration of intravenous contrast. Multiplanar CT image reconstructions and MIPs were obtained to evaluate the vascular anatomy. CONTRAST:  52mL ISOVUE-370 IOPAMIDOL (ISOVUE-370) INJECTION 76% COMPARISON:  Chest CT May 24, 2018; chest radiograph November 06, 2018 FINDINGS: Cardiovascular: There is no demonstrable pulmonary embolus. There is no thoracic  aortic aneurysm or dissection. There is calcification at the origin of the left subclavian artery. Other visualized great vessels appear unremarkable. There is aortic atherosclerosis. There are foci of coronary artery calcification evident. There is a small amount of pericardial fluid, within the physiologic range, stable. Pericardium does not appear appreciably thickened. Prominence of the main pulmonary outflow tract is noted with a measured diameter of 3.7 cm. Mediastinum/Nodes: There is a subcentimeter nodular opacity in the left lobe of the thyroid, stable. There are multiple subcentimeter mediastinal lymph nodes again noted. There are prominent right paratracheal lymph nodes with largest lymph node in this area measuring 1.8 x 1.4 cm, marginally smaller than on prior study. Aortopulmonary window lymph nodes appear slightly smaller than on previous study. No new lymph node prominence is evident on this study. No esophageal lesions are evident. Lungs/Pleura: There is atelectatic change in the lung base regions, stable. There is no frank edema or consolidation. A mild degree of centrilobular emphysematous change appears stable. No appreciable pleural effusion. Upper Abdomen: There is a cyst in the medial upper pole right kidney measuring 1.5 x 1.5 cm. A cyst is noted in the lateral mid left kidney measuring 1.5 x 1.3 cm. There is aortic atherosclerosis as well as calcification in the proximal visualized great vessels. Visualized upper abdominal structures otherwise appear unremarkable. Musculoskeletal: There is stable anterior wedging of the L1 vertebral body. There is multilevel degenerative change in the thoracic spine which appear stable. No blastic or lytic bone lesions are evident. No evident chest wall lesions. Review of the MIP images confirms the above findings. IMPRESSION: 1. No demonstrable pulmonary embolus. No thoracic aortic aneurysm or dissection. There is aortic atherosclerosis. There are foci of  great vessel and major mesenteric arterial calcification with foci of coronary artery calcification noted as well. 2. Prominence of the main pulmonary outflow tract, a finding felt to be indicative of a degree of pulmonary arterial hypertension. 3. Note that there is calcification in both proximal renal arteries. In this regard, question whether patient is hypertensive due to renal artery stenosis. 4. There is mild centrilobular emphysematous change, stable. There is atelectatic change, primarily in the bases. No frank edema or consolidation. 5. Prominent paratracheal lymph nodes, marginally smaller than on previous study. No new lymph node prominence compared to previous study. 6. Small amount of pericardial fluid, likely in the physiologic range. Aortic Atherosclerosis (ICD10-I70.0) and Emphysema (ICD10-J43.9). Electronically Signed   By: Lowella Grip III M.D.   On: 11/08/2018 11:34   Dg Chest Portable 1 View  Result Date: 11/06/2018 CLINICAL DATA:  Elevated blood pressure. Shortness of breath. EXAM: PORTABLE CHEST 1 VIEW COMPARISON:  09/25/2018. FINDINGS: The heart is enlarged. There is mild vascular congestion. Platelike atelectasis is seen at the RIGHT base. No consolidation or edema. No effusion or pneumothorax. Thoracic atherosclerosis. Osteopenia. IMPRESSION: Cardiomegaly. Mild vascular congestion. No consolidation or edema. Slight improvement aeration  compared with priors. Electronically Signed   By: Staci Righter M.D.   On: 11/06/2018 20:09    Scheduled Meds: . aspirin EC  325 mg Oral Daily  . carvedilol  12.5 mg Oral BID  . furosemide  20 mg Intravenous Q12H  . gabapentin  800 mg Oral TID  . heparin  5,000 Units Subcutaneous Q8H  . insulin aspart  0-9 Units Subcutaneous TID WC  . irbesartan  75 mg Oral Daily  . metFORMIN  500 mg Oral Q breakfast  . mometasone-formoterol  2 puff Inhalation BID  . montelukast  10 mg Oral QHS  . oxyCODONE  10 mg Oral BID  . pantoprazole  40 mg Oral  BID AC  . polyethylene glycol  17 g Oral Daily  . potassium chloride  10 mEq Oral BID  . sucralfate  1 g Oral TID AC & HS  . tiotropium  18 mcg Inhalation Daily   Continuous Infusions:  Assessment/Plan:  1. Acute on chronic diastolic congestive heart failure.  Continue IV Lasix daily dosing. patient on Coreg and irbesartan. 2. Atrial tachycardia few beats.  Continue Coreg.  CT scan of the chest negative for pulmonary embolism 3. Uncontrolled hypertension.  Blood pressure on the lower side today.  Cut back on Lasix to daily dosing.  Continue Coreg.  May need to cut back on irbesartan even further. 4. Type 2 diabetes mellitus.  Hold metformin with CT scan. 5. COPD on chronic oxygen with chronic respiratory failure continue 2 L of oxygen in usual inhalers.  Code Status:     Code Status Orders  (From admission, onward)         Start     Ordered   11/06/18 2237  Full code  Continuous     11/06/18 2236        Code Status History    Date Active Date Inactive Code Status Order ID Comments User Context   09/25/2018 2129 09/28/2018 1737 Full Code [id number]  Sela Hua, MD Inpatient   05/25/2018 0527 05/25/2018 1825 Full Code [id number]  Arta Silence, MD Inpatient   12/19/2017 0514 12/19/2017 1813 Full Code [id number]  Saundra Shelling, MD Inpatient   08/30/2017 1021 08/31/2017 1500 Full Code [id number]  Bettey Costa, MD Inpatient   04/18/2016 0142 04/19/2016 1732 Full Code [id number]  Lance Coon, MD ED   02/28/2016 0540 03/01/2016 1739 Full Code [id number]  Saundra Shelling, MD Inpatient    Advance Directive Documentation     Most Recent Value  Type of Advance Directive  Living will  Pre-existing out of facility DNR order (yellow form or pink MOST form)  -  "MOST" Form in Place?  -     Family Communication: Family on the phone Disposition Plan: Potential disposition tomorrow  Consultants:  Nephrology  Time spent: 25 minutes  [place]

## 2018-11-09 LAB — GLUCOSE, CAPILLARY: Glucose-Capillary: 119 mg/dL — ABNORMAL HIGH (ref 70–99)

## 2018-11-09 LAB — BASIC METABOLIC PANEL
ANION GAP: 7 (ref 5–15)
BUN: 42 mg/dL — ABNORMAL HIGH (ref 8–23)
CALCIUM: 9 mg/dL (ref 8.9–10.3)
CO2: 29 mmol/L (ref 22–32)
Chloride: 105 mmol/L (ref 98–111)
Creatinine, Ser: 1.11 mg/dL — ABNORMAL HIGH (ref 0.44–1.00)
GFR calc Af Amer: 54 mL/min — ABNORMAL LOW (ref >60)
GFR, EST NON AFRICAN AMERICAN: 47 mL/min — AB (ref >60)
GLUCOSE: 115 mg/dL — AB (ref 70–99)
POTASSIUM: 4.1 mmol/L (ref 3.5–5.1)
SODIUM: 141 mmol/L (ref 135–145)

## 2018-11-09 MED ORDER — IRBESARTAN 75 MG PO TABS: 75.0000 mg | ORAL_TABLET | Freq: Every day | ORAL | 0 refills | Status: AC

## 2018-11-09 MED ORDER — CEPHALEXIN 500 MG PO CAPS: 500.0000 mg | ORAL_CAPSULE | Freq: Three times a day (TID) | ORAL | 0 refills | Status: AC

## 2018-11-09 MED ORDER — FUROSEMIDE 20 MG PO TABS: ORAL_TABLET | ORAL | 0 refills | Status: AC

## 2018-11-09 MED ORDER — CLONIDINE HCL 0.1 MG PO TABS: ORAL_TABLET | ORAL | 0 refills | Status: AC

## 2018-11-09 NOTE — Care Management Important Message (Signed)
Important Message  Patient Details  Name: Megan Bolton MRN: [id number] Date of Birth: 1941-05-19   Medicare Important Message Given:  Yes     Katrina Stack, RN 11/09/2018, 4:03 PM

## 2018-11-09 NOTE — Care Management (Signed)
Patient discharged home today.  Notified [facility] of order for RN and PT and signed heart failure protocol.  Informed that patient was already being followed under the heart failure protocol

## 2018-11-09 NOTE — Progress Notes (Signed)
[facility]  ROUNDING NOTE   Subjective:  Patient seen at bedside. Blood pressure under good control. Less short of breath now.   Objective:  Vital signs in last 24 hours:  Temp:  [97.9 F (36.6 C)-98.8 F (37.1 C)] 98.8 F (37.1 C) (11/13 0815) Pulse Rate:  [70-77] 70 (11/13 0932) Resp:  [14-18] 18 (11/13 0459) BP: (106-138)/(61-74) 130/66 (11/13 0932) SpO2:  [91 %-93 %] 93 % (11/13 0932) Weight:  [93.8 kg] 93.8 kg (11/13 0459)  Weight change: 0.045 kg Filed Weights   11/06/18 2244 11/08/18 0504 11/09/18 0459  Weight: 94.5 kg 93.8 kg 93.8 kg    Intake/Output: I/O last 3 completed shifts: In: -  Out: 2150 [Urine:2150]   Intake/Output this shift:  No intake/output data recorded.  Physical Exam: General: No acute distress  Head: Normocephalic, atraumatic. Moist oral mucosal membranes  Eyes: Anicteric  Neck: Supple, trachea midline  Lungs:  Mild basilar rales, normal effort  Heart: S1S2 no rubs  Abdomen:  Soft, nontender, bowel sounds present  Extremities: trace peripheral edema.  Neurologic: Awake, alert, following commands  Skin: No lesions       Basic Metabolic Panel: Recent Labs  Lab 11/06/18 1905 11/07/18 0504 11/08/18 0539 11/09/18 0539  NA 141 141 141 141  K 4.1 3.8 3.8 4.1  CL 102 101 103 105  CO2 31 32 31 29  GLUCOSE 101* 128* 116* 115*  BUN 23 27* 39* 42*  CREATININE 0.77 0.98 1.17* 1.11*  CALCIUM 9.4 9.0 8.9 9.0    Liver Function Tests: Recent Labs  Lab 11/06/18 1905  AST 17  ALT 16  ALKPHOS 60  BILITOT 0.6  PROT 6.9  ALBUMIN 3.9   No results for input(s): LIPASE, AMYLASE in the last 168 hours. No results for input(s): AMMONIA in the last 168 hours.  CBC: Recent Labs  Lab 11/06/18 1905 11/07/18 0504  WBC 11.8* 11.6*  HGB 12.3 11.6*  HCT 40.2 38.7  MCV 91.2 91.9  PLT 223 219    Cardiac Enzymes: Recent Labs  Lab 11/06/18 1905  TROPONINI 0.04*    BNP: Invalid input(s): POCBNP  CBG: Recent Labs   Lab 11/08/18 0817 11/08/18 1133 11/08/18 1624 11/08/18 2050 11/09/18 0817  GLUCAP 125* 152* 111* 125* 119*    Microbiology: Results for orders placed or performed during the hospital encounter of 04/08/18  Urine Culture     Status: Abnormal   Collection Time: 04/08/18 12:22 PM  Result Value Ref Range Status   Specimen Description   Final    URINE, CLEAN CATCH Performed at [facility], [address], [place], [place] [address]    Special Requests   Final    Normal Performed at [facility], [address] [place], [place] [address]    Culture (A)  Final    >=100,000 COLONIES/mL ESCHERICHIA COLI >=100,000 COLONIES/mL VANCOMYCIN RESISTANT ENTEROCOCCUS    Report Status 04/11/2018 FINAL  Final   Organism ID, Bacteria ESCHERICHIA COLI (A)  Final   Organism ID, Bacteria VANCOMYCIN RESISTANT ENTEROCOCCUS (A)  Final      Susceptibility   Escherichia coli - MIC*    AMPICILLIN >=32 RESISTANT Resistant     CEFAZOLIN 8 SENSITIVE Sensitive     CEFTRIAXONE <=1 SENSITIVE Sensitive     CIPROFLOXACIN >=4 RESISTANT Resistant     GENTAMICIN <=1 SENSITIVE Sensitive     IMIPENEM <=0.25 SENSITIVE Sensitive     NITROFURANTOIN <=16 SENSITIVE Sensitive     TRIMETH/SULFA >=320 RESISTANT Resistant  AMPICILLIN/SULBACTAM 16 INTERMEDIATE Intermediate     PIP/TAZO 8 SENSITIVE Sensitive     Extended ESBL NEGATIVE Sensitive     * >=100,000 COLONIES/mL ESCHERICHIA COLI   Vancomycin resistant enterococcus - MIC*    AMPICILLIN >=32 RESISTANT Resistant     LEVOFLOXACIN >=8 RESISTANT Resistant     NITROFURANTOIN <=16 SENSITIVE Sensitive     VANCOMYCIN >=32 RESISTANT Resistant     * >=100,000 COLONIES/mL VANCOMYCIN RESISTANT ENTEROCOCCUS    Coagulation Studies: No results for input(s): LABPROT, INR in the last 72 hours.  Urinalysis: Recent Labs    11/08/18 2226  COLORURINE YELLOW*  LABSPEC 1.021  PHURINE 6.0  GLUCOSEU NEGATIVE  HGBUR NEGATIVE  BILIRUBINUR NEGATIVE   KETONESUR NEGATIVE  PROTEINUR NEGATIVE  NITRITE POSITIVE*  LEUKOCYTESUR TRACE*      Imaging: Ct Angio Chest Pe W Or Wo Contrast  Result Date: 11/08/2018 CLINICAL DATA:  Shortness of breath and chest tightness EXAM: CT ANGIOGRAPHY CHEST WITH CONTRAST TECHNIQUE: Multidetector CT imaging of the chest was performed using the standard protocol during bolus administration of intravenous contrast. Multiplanar CT image reconstructions and MIPs were obtained to evaluate the vascular anatomy. CONTRAST:  27mL ISOVUE-370 IOPAMIDOL (ISOVUE-370) INJECTION 76% COMPARISON:  Chest CT May 24, 2018; chest radiograph November 06, 2018 FINDINGS: Cardiovascular: There is no demonstrable pulmonary embolus. There is no thoracic aortic aneurysm or dissection. There is calcification at the origin of the left subclavian artery. Other visualized great vessels appear unremarkable. There is aortic atherosclerosis. There are foci of coronary artery calcification evident. There is a small amount of pericardial fluid, within the physiologic range, stable. Pericardium does not appear appreciably thickened. Prominence of the main pulmonary outflow tract is noted with a measured diameter of 3.7 cm. Mediastinum/Nodes: There is a subcentimeter nodular opacity in the left lobe of the thyroid, stable. There are multiple subcentimeter mediastinal lymph nodes again noted. There are prominent right paratracheal lymph nodes with largest lymph node in this area measuring 1.8 x 1.4 cm, marginally smaller than on prior study. Aortopulmonary window lymph nodes appear slightly smaller than on previous study. No new lymph node prominence is evident on this study. No esophageal lesions are evident. Lungs/Pleura: There is atelectatic change in the lung base regions, stable. There is no frank edema or consolidation. A mild degree of centrilobular emphysematous change appears stable. No appreciable pleural effusion. Upper Abdomen: There is a cyst in the  medial upper pole right kidney measuring 1.5 x 1.5 cm. A cyst is noted in the lateral mid left kidney measuring 1.5 x 1.3 cm. There is aortic atherosclerosis as well as calcification in the proximal visualized great vessels. Visualized upper abdominal structures otherwise appear unremarkable. Musculoskeletal: There is stable anterior wedging of the L1 vertebral body. There is multilevel degenerative change in the thoracic spine which appear stable. No blastic or lytic bone lesions are evident. No evident chest wall lesions. Review of the MIP images confirms the above findings. IMPRESSION: 1. No demonstrable pulmonary embolus. No thoracic aortic aneurysm or dissection. There is aortic atherosclerosis. There are foci of great vessel and major mesenteric arterial calcification with foci of coronary artery calcification noted as well. 2. Prominence of the main pulmonary outflow tract, a finding felt to be indicative of a degree of pulmonary arterial hypertension. 3. Note that there is calcification in both proximal renal arteries. In this regard, question whether patient is hypertensive due to renal artery stenosis. 4. There is mild centrilobular emphysematous change, stable. There is atelectatic change, primarily in the  bases. No frank edema or consolidation. 5. Prominent paratracheal lymph nodes, marginally smaller than on previous study. No new lymph node prominence compared to previous study. 6. Small amount of pericardial fluid, likely in the physiologic range. Aortic Atherosclerosis (ICD10-I70.0) and Emphysema (ICD10-J43.9). Electronically Signed   By: Lowella Grip III M.D.   On: 11/08/2018 11:34     Medications:    . aspirin EC  325 mg Oral Daily  . carvedilol  12.5 mg Oral BID  . furosemide  20 mg Intravenous Daily  . gabapentin  800 mg Oral TID  . heparin  5,000 Units Subcutaneous Q8H  . insulin aspart  0-9 Units Subcutaneous TID WC  . mometasone-formoterol  2 puff Inhalation BID  .  montelukast  10 mg Oral QHS  . oxyCODONE  10 mg Oral BID  . pantoprazole  40 mg Oral BID AC  . polyethylene glycol  17 g Oral Daily  . potassium chloride  10 mEq Oral BID  . sucralfate  1 g Oral TID AC & HS  . tiotropium  18 mcg Inhalation Daily   acetaminophen, albuterol, clonazePAM, docusate sodium, ondansetron, valACYclovir  Assessment/ Plan:  77 y.o. female with COPD, hypertension, diabetes mellitus type II, obstructive sleep apnea, pulmonary hypertension, congestive heart failure, depression  1.  Hypertension, blood pressure now under good control. 2.  [name] stage II/Proteinuria. 3.  Chest pain/Chronic systolic heart failure.   Plan:  Blood pressure currently under good control at 130/66.  Patient taken off of irbesartan.  Continue carvedilol 0.5 mg twice daily.  Renal function was also a bit worse than likely secondary to contrast exposure.  Otherwise disposition as per hospitalist.   LOS: 3 Yasaman Kolek 11/13/201910:14 AM

## 2018-11-09 NOTE — Discharge Summary (Signed)
Megan Bolton at [facility] NAME: Megan Bolton    MR#:  [id number]  DATE OF BIRTH:  1941/12/24  DATE OF ADMISSION:  11/06/2018 ADMITTING PHYSICIAN: Megan Basta, MD  DATE OF DISCHARGE: 11/09/2018 11:55 AM  PRIMARY CARE PHYSICIAN: Megan Hire, MD    ADMISSION DIAGNOSIS:  Acute on chronic congestive heart failure, unspecified heart failure type ([place]) [I50.9]  DISCHARGE DIAGNOSIS:  Active Problems:   Uncontrolled hypertension   CHF exacerbation (HCC)   Acute on chronic diastolic CHF (congestive heart failure) ([place])   SECONDARY DIAGNOSIS:   Past Medical History:  Diagnosis Date  . CHF (congestive heart failure) ([place])   . COPD (chronic obstructive pulmonary disease) ([place])   . Diabetes mellitus without complication ([place])   . Diastolic heart failure ([place])   . Hyperlipidemia   . Hypertension     HOSPITAL COURSE:   1.  Acute on chronic diastolic congestive heart failure.  The patient was given IV Lasix during the hospitalization.  Patient also on Coreg and low-dose irbesartan.  Patient weight went from 96.6 kg down to 93.8 kg.  Patient was urinating well. 2.  Accelerated hypertension on presentation and then also borderline low blood pressure during hospitalization.  Patient will be on Lasix 20 mg alternating with 40 mg, Coreg and low-dose irbesartan.  Dr. Holley Raring recommended clonidine 0.1 mg as needed for accelerated hypertension. 3.  Brief episode of atrial tachycardia.  CT scan of the chest negative for pulmonary embolism.  Patient on Coreg and rate controlled. 4.  Type 2 diabetes.  Can go back on metformin as outpatient. 5.  COPD on chronic oxygen with chronic respiratory failure.  Continue 2 L of oxygen and usual inhalers. 6.  Acute cystitis.  Prescribed Keflex.  Follow-up urine culture.  DISCHARGE CONDITIONS:   Satisfactory  CONSULTS OBTAINED:  Treatment Team:  Anthonette Legato, MD  DRUG ALLERGIES:   Allergies   Allergen Reactions  . Nitrofurantoin Anaphylaxis    DISCHARGE MEDICATIONS:   Allergies as of 11/09/2018      Reactions   Nitrofurantoin Anaphylaxis      Medication List    STOP taking these medications   atorvastatin 40 MG tablet Commonly known as:  LIPITOR   estradiol 0.1 MG/GM vaginal cream Commonly known as:  ESTRACE   irbesartan-hydrochlorothiazide 300-12.5 MG tablet Commonly known as:  AVALIDE     TAKE these medications   ACCU-CHEK AVIVA PLUS test strip Generic drug:  glucose blood 1 each by Other route 2 (two) times daily.   ACCU-CHEK AVIVA PLUS w/Device Kit USE AS DIRECTED   ACCU-CHEK SOFTCLIX LANCETS lancets 1 each by Other route 2 (two) times daily.   albuterol 108 (90 Base) MCG/ACT inhaler Commonly known as:  PROVENTIL HFA;VENTOLIN HFA Inhale 2 puffs into the lungs every 6 (six) hours as needed for wheezing or shortness of breath.   albuterol (2.5 MG/3ML) 0.083% nebulizer solution Commonly known as:  PROVENTIL Take 3 mLs (2.5 mg total) by nebulization every 6 (six) hours as needed for wheezing or shortness of breath.   aspirin EC 81 MG tablet Take 325 mg by mouth daily.   carvedilol 12.5 MG tablet Commonly known as:  COREG Take 1 tablet (12.5 mg total) by mouth 2 (two) times daily.   cephALEXin 500 MG capsule Commonly known as:  KEFLEX Take 1 capsule (500 mg total) by mouth 3 (three) times daily for 5 days.   clonazePAM 0.5 MG tablet Commonly known as:  KLONOPIN Take  0.5 mg by mouth at bedtime as needed.   cloNIDine 0.1 MG tablet Commonly known as:  CATAPRES 1 tablet daily as needed for systolic blood pressure (top number) greater than 536 or diastolic blood pressure (bottom number) greater than 105   conjugated estrogens vaginal cream Commonly known as:  PREMARIN Apply 0.39m (pea-sized amount)  just inside the vaginal introitus with a finger-tip on  Monday, Wednesday and Friday nights.   Fluticasone-Salmeterol 250-50 MCG/DOSE  Aepb Commonly known as:  ADVAIR Inhale 1 puff into the lungs 2 (two) times daily.   furosemide 20 MG tablet Commonly known as:  LASIX Alternate one pill orally every other day, 2 pills po every onther day What changed:    how much to take  how to take this  when to take this  additional instructions   gabapentin 800 MG tablet Commonly known as:  NEURONTIN Take 1 tablet (800 mg total) by mouth 3 (three) times daily.   irbesartan 75 MG tablet Commonly known as:  AVAPRO Take 1 tablet (75 mg total) by mouth daily.   metFORMIN 500 MG tablet Commonly known as:  GLUCOPHAGE TAKE ONE TABLET BY MOUTH EVERY MORNING WITH BREAKFAST What changed:  See the new instructions.   montelukast 10 MG tablet Commonly known as:  SINGULAIR TAKE ONE TABLET BY MOUTH AT BEDTIME   ondansetron 4 MG disintegrating tablet Commonly known as:  ZOFRAN-ODT Take 4 mg by mouth every 8 (eight) hours as needed for nausea/vomiting.   OXYCONTIN 10 mg 12 hr tablet Generic drug:  oxyCODONE Take 10 mg by mouth 2 (two) times daily.   OXYGEN Inhale 2 L into the lungs continuous.   pantoprazole 40 MG tablet Commonly known as:  PROTONIX Take 40 mg by mouth 2 (two) times daily before a meal.   polyethylene glycol packet Commonly known as:  MIRALAX / GLYCOLAX Take 17 g by mouth daily.   potassium chloride 10 MEQ tablet Commonly known as:  K-DUR Take 1 tablet (10 mEq total) by mouth 2 (two) times daily.   sucralfate 1 g tablet Commonly known as:  CARAFATE Take 1 tablet by mouth 2 (two) times daily.   tiotropium 18 MCG inhalation capsule Commonly known as:  SPIRIVA Place 1 capsule (18 mcg total) into inhaler and inhale daily.   valACYclovir 1000 MG tablet Commonly known as:  VALTREX Take 1 tablet (1,000 mg total) by mouth daily as needed (for fever blisters).        DISCHARGE INSTRUCTIONS:   Follow-up PMD 5 days Follow-up heart failure clinic   If you experience worsening of your admission  symptoms, develop shortness of breath, life threatening emergency, suicidal or homicidal thoughts you must seek medical attention immediately by calling 911 or calling your MD immediately  if symptoms less severe.  You Must read complete instructions/literature along with all the possible adverse reactions/side effects for all the Medicines you take and that have been prescribed to you. Take any new Medicines after you have completely understood and accept all the possible adverse reactions/side effects.   Please note  You were cared for by a hospitalist during your hospital stay. If you have any questions about your discharge medications or the care you received while you were in the hospital after you are discharged, you can call the unit and asked to speak with the hospitalist on call if the hospitalist that took care of you is not available. Once you are discharged, your primary care physician will handle any further medical  issues. Please note that NO REFILLS for any discharge medications will be authorized once you are discharged, as it is imperative that you return to your primary care physician (or establish a relationship with a primary care physician if you do not have one) for your aftercare needs so that they can reassess your need for medications and monitor your lab values.    Today   CHIEF COMPLAINT:   Chief Complaint  Patient presents with  . Hypertension    HISTORY OF PRESENT ILLNESS:  Amelita Orser  is a 77 y.o. female with a known history of hypertension presented with shortness of breath   VITAL SIGNS:  Blood pressure 130/66, pulse 70, temperature 98.8 F (37.1 C), temperature source Oral, resp. rate 18, height 5' 4"  (1.626 m), weight 93.8 kg, SpO2 93 %.  PHYSICAL EXAMINATION:  GENERAL:  77 y.o.-year-old patient lying in the bed with no acute distress.  EYES: Pupils equal, round, reactive to light and accommodation. No scleral icterus. Extraocular muscles intact.   HEENT: Head atraumatic, normocephalic. Oropharynx and nasopharynx clear.  NECK:  Supple, no jugular venous distention. No thyroid enlargement, no tenderness.  LUNGS: Normal breath sounds bilaterally, no wheezing, rales,rhonchi or crepitation. No use of accessory muscles of respiration.  CARDIOVASCULAR: S1, S2 normal. No murmurs, rubs, or gallops.  ABDOMEN: Soft, non-tender, non-distended. Bowel sounds present. No organomegaly or mass.  EXTREMITIES: Trace pedal edema, no cyanosis, or clubbing.  NEUROLOGIC: Cranial nerves II through XII are intact. Muscle strength 5/5 in all extremities. Sensation intact. Gait not checked.  PSYCHIATRIC: The patient is alert and oriented x 3.  SKIN: No obvious rash, lesion, or ulcer.   DATA REVIEW:   CBC Recent Labs  Lab 11/07/18 0504  WBC 11.6*  HGB 11.6*  HCT 38.7  PLT 219    Chemistries  Recent Labs  Lab 11/06/18 1905  11/09/18 0539  NA 141   < > 141  K 4.1   < > 4.1  CL 102   < > 105  CO2 31   < > 29  GLUCOSE 101*   < > 115*  BUN 23   < > 42*  CREATININE 0.77   < > 1.11*  CALCIUM 9.4   < > 9.0  AST 17  --   --   ALT 16  --   --   ALKPHOS 60  --   --   BILITOT 0.6  --   --    < > = values in this interval not displayed.    Cardiac Enzymes Recent Labs  Lab 11/06/18 1905  TROPONINI 0.04*      RADIOLOGY:  Ct Angio Chest Pe W Or Wo Contrast  Result Date: 11/08/2018 CLINICAL DATA:  Shortness of breath and chest tightness EXAM: CT ANGIOGRAPHY CHEST WITH CONTRAST TECHNIQUE: Multidetector CT imaging of the chest was performed using the standard protocol during bolus administration of intravenous contrast. Multiplanar CT image reconstructions and MIPs were obtained to evaluate the vascular anatomy. CONTRAST:  71m ISOVUE-370 IOPAMIDOL (ISOVUE-370) INJECTION 76% COMPARISON:  Chest CT May 24, 2018; chest radiograph November 06, 2018 FINDINGS: Cardiovascular: There is no demonstrable pulmonary embolus. There is no thoracic aortic aneurysm  or dissection. There is calcification at the origin of the left subclavian artery. Other visualized great vessels appear unremarkable. There is aortic atherosclerosis. There are foci of coronary artery calcification evident. There is a small amount of pericardial fluid, within the physiologic range, stable. Pericardium does not appear appreciably thickened. Prominence of  the main pulmonary outflow tract is noted with a measured diameter of 3.7 cm. Mediastinum/Nodes: There is a subcentimeter nodular opacity in the left lobe of the thyroid, stable. There are multiple subcentimeter mediastinal lymph nodes again noted. There are prominent right paratracheal lymph nodes with largest lymph node in this area measuring 1.8 x 1.4 cm, marginally smaller than on prior study. Aortopulmonary window lymph nodes appear slightly smaller than on previous study. No new lymph node prominence is evident on this study. No esophageal lesions are evident. Lungs/Pleura: There is atelectatic change in the lung base regions, stable. There is no frank edema or consolidation. A mild degree of centrilobular emphysematous change appears stable. No appreciable pleural effusion. Upper Abdomen: There is a cyst in the medial upper pole right kidney measuring 1.5 x 1.5 cm. A cyst is noted in the lateral mid left kidney measuring 1.5 x 1.3 cm. There is aortic atherosclerosis as well as calcification in the proximal visualized great vessels. Visualized upper abdominal structures otherwise appear unremarkable. Musculoskeletal: There is stable anterior wedging of the L1 vertebral body. There is multilevel degenerative change in the thoracic spine which appear stable. No blastic or lytic bone lesions are evident. No evident chest wall lesions. Review of the MIP images confirms the above findings. IMPRESSION: 1. No demonstrable pulmonary embolus. No thoracic aortic aneurysm or dissection. There is aortic atherosclerosis. There are foci of great vessel and  major mesenteric arterial calcification with foci of coronary artery calcification noted as well. 2. Prominence of the main pulmonary outflow tract, a finding felt to be indicative of a degree of pulmonary arterial hypertension. 3. Note that there is calcification in both proximal renal arteries. In this regard, question whether patient is hypertensive due to renal artery stenosis. 4. There is mild centrilobular emphysematous change, stable. There is atelectatic change, primarily in the bases. No frank edema or consolidation. 5. Prominent paratracheal lymph nodes, marginally smaller than on previous study. No new lymph node prominence compared to previous study. 6. Small amount of pericardial fluid, likely in the physiologic range. Aortic Atherosclerosis (ICD10-I70.0) and Emphysema (ICD10-J43.9). Electronically Signed   By: Lowella Grip III M.D.   On: 11/08/2018 11:34     Management plans discussed with the patient, family and they are in agreement.  CODE STATUS:     Code Status Orders  (From admission, onward)         Start     Ordered   11/06/18 2237  Full code  Continuous     11/06/18 2236        Code Status History    Date Active Date Inactive Code Status Order ID Comments User Context   09/25/2018 2129 09/28/2018 1737 Full Code [id number]  Sela Hua, MD Inpatient   05/25/2018 0527 05/25/2018 1825 Full Code [id number]  Arta Silence, MD Inpatient   12/19/2017 0514 12/19/2017 1813 Full Code [id number]  Saundra Shelling, MD Inpatient   08/30/2017 1021 08/31/2017 1500 Full Code [id number]  Bettey Costa, MD Inpatient   04/18/2016 0142 04/19/2016 1732 Full Code [id number]  Lance Coon, MD ED   02/28/2016 0540 03/01/2016 1739 Full Code [id number]  Saundra Shelling, MD Inpatient    Advance Directive Documentation     Most Recent Value  Type of Advance Directive  Living will  Pre-existing out of facility DNR order (yellow form or pink MOST form)  -  "MOST" Form in Place?  -      TOTAL TIME  TAKING CARE OF THIS PATIENT: 35 minutes.  Loletha Grayer M.D on 11/09/2018 at 2:00 PM  Between 7am to 6pm - Pager - [phone]  After 6pm go to [email or web address] - password [email or web address] [facility]  [phone]  CC: Primary care physician; Megan Hire, MD

## 2018-11-11 LAB — URINE CULTURE: Culture: >=100000 — AB

## 2018-11-11 NOTE — Progress Notes (Addendum)
Patient ID: Megan Bolton, female   DOB: 1941/08/23, 77 y.o.   MRN: [id number]  Urine culture with resistance pattern.  Looks like he will be sensitive to Augmentin.  Patient states that she does well with fosfomycin.  Spoke with her pharmacy and they have to order the fosfomycin.  I will do Augmentin 875 mg twice a day until the fosfomycin is received.  And will do 2 doses of that.  Spoke with daughter earlier and then patient just now.  Dr Loletha Grayer

## 2018-11-12 DIAGNOSIS — G8929 Other chronic pain: Secondary | ICD-10-CM | POA: Diagnosis not present

## 2018-11-12 DIAGNOSIS — E785 Hyperlipidemia, unspecified: Secondary | ICD-10-CM | POA: Diagnosis not present

## 2018-11-12 DIAGNOSIS — J449 Chronic obstructive pulmonary disease, unspecified: Secondary | ICD-10-CM | POA: Diagnosis not present

## 2018-11-12 DIAGNOSIS — I5032 Chronic diastolic (congestive) heart failure: Secondary | ICD-10-CM | POA: Diagnosis not present

## 2018-11-12 DIAGNOSIS — I110 Hypertensive heart disease with heart failure: Secondary | ICD-10-CM | POA: Diagnosis not present

## 2018-11-12 DIAGNOSIS — Z7951 Long term (current) use of inhaled steroids: Secondary | ICD-10-CM | POA: Diagnosis not present

## 2018-11-12 DIAGNOSIS — J9621 Acute and chronic respiratory failure with hypoxia: Secondary | ICD-10-CM | POA: Diagnosis not present

## 2018-11-12 DIAGNOSIS — E119 Type 2 diabetes mellitus without complications: Secondary | ICD-10-CM | POA: Diagnosis not present

## 2018-11-12 DIAGNOSIS — D649 Anemia, unspecified: Secondary | ICD-10-CM | POA: Diagnosis not present

## 2018-11-14 ENCOUNTER — Ambulatory Visit: Payer: Medicare HMO | Admitting: Family

## 2018-11-15 DIAGNOSIS — N183 Chronic kidney disease, stage 3 (moderate): Secondary | ICD-10-CM | POA: Diagnosis not present

## 2018-11-15 DIAGNOSIS — N390 Urinary tract infection, site not specified: Secondary | ICD-10-CM | POA: Diagnosis not present

## 2018-11-15 DIAGNOSIS — E1122 Type 2 diabetes mellitus with diabetic chronic kidney disease: Secondary | ICD-10-CM | POA: Diagnosis not present

## 2018-11-15 DIAGNOSIS — J432 Centrilobular emphysema: Secondary | ICD-10-CM | POA: Diagnosis not present

## 2018-11-15 DIAGNOSIS — I129 Hypertensive chronic kidney disease with stage 1 through stage 4 chronic kidney disease, or unspecified chronic kidney disease: Secondary | ICD-10-CM | POA: Diagnosis not present

## 2018-11-15 DIAGNOSIS — I272 Pulmonary hypertension, unspecified: Secondary | ICD-10-CM | POA: Diagnosis not present

## 2018-11-15 DIAGNOSIS — I5031 Acute diastolic (congestive) heart failure: Secondary | ICD-10-CM | POA: Diagnosis not present

## 2018-11-15 DIAGNOSIS — I10 Essential (primary) hypertension: Secondary | ICD-10-CM | POA: Diagnosis not present

## 2018-11-15 DIAGNOSIS — E876 Hypokalemia: Secondary | ICD-10-CM | POA: Diagnosis not present

## 2018-11-16 DIAGNOSIS — Z7951 Long term (current) use of inhaled steroids: Secondary | ICD-10-CM | POA: Diagnosis not present

## 2018-11-16 DIAGNOSIS — D649 Anemia, unspecified: Secondary | ICD-10-CM | POA: Diagnosis not present

## 2018-11-16 DIAGNOSIS — G8929 Other chronic pain: Secondary | ICD-10-CM | POA: Diagnosis not present

## 2018-11-16 DIAGNOSIS — I110 Hypertensive heart disease with heart failure: Secondary | ICD-10-CM | POA: Diagnosis not present

## 2018-11-16 DIAGNOSIS — J9621 Acute and chronic respiratory failure with hypoxia: Secondary | ICD-10-CM | POA: Diagnosis not present

## 2018-11-16 DIAGNOSIS — E119 Type 2 diabetes mellitus without complications: Secondary | ICD-10-CM | POA: Diagnosis not present

## 2018-11-16 DIAGNOSIS — J449 Chronic obstructive pulmonary disease, unspecified: Secondary | ICD-10-CM | POA: Diagnosis not present

## 2018-11-16 DIAGNOSIS — E785 Hyperlipidemia, unspecified: Secondary | ICD-10-CM | POA: Diagnosis not present

## 2018-11-16 DIAGNOSIS — I5032 Chronic diastolic (congestive) heart failure: Secondary | ICD-10-CM | POA: Diagnosis not present

## 2018-11-18 DIAGNOSIS — D649 Anemia, unspecified: Secondary | ICD-10-CM | POA: Diagnosis not present

## 2018-11-18 DIAGNOSIS — E785 Hyperlipidemia, unspecified: Secondary | ICD-10-CM | POA: Diagnosis not present

## 2018-11-18 DIAGNOSIS — Z7951 Long term (current) use of inhaled steroids: Secondary | ICD-10-CM | POA: Diagnosis not present

## 2018-11-18 DIAGNOSIS — J449 Chronic obstructive pulmonary disease, unspecified: Secondary | ICD-10-CM | POA: Diagnosis not present

## 2018-11-18 DIAGNOSIS — I5032 Chronic diastolic (congestive) heart failure: Secondary | ICD-10-CM | POA: Diagnosis not present

## 2018-11-18 DIAGNOSIS — E119 Type 2 diabetes mellitus without complications: Secondary | ICD-10-CM | POA: Diagnosis not present

## 2018-11-18 DIAGNOSIS — J9621 Acute and chronic respiratory failure with hypoxia: Secondary | ICD-10-CM | POA: Diagnosis not present

## 2018-11-18 DIAGNOSIS — G8929 Other chronic pain: Secondary | ICD-10-CM | POA: Diagnosis not present

## 2018-11-18 DIAGNOSIS — I110 Hypertensive heart disease with heart failure: Secondary | ICD-10-CM | POA: Diagnosis not present

## 2018-11-20 DIAGNOSIS — J449 Chronic obstructive pulmonary disease, unspecified: Secondary | ICD-10-CM | POA: Diagnosis not present

## 2018-11-21 DIAGNOSIS — Z7151 Drug abuse counseling and surveillance of drug abuser: Secondary | ICD-10-CM | POA: Diagnosis not present

## 2018-11-21 DIAGNOSIS — M5430 Sciatica, unspecified side: Secondary | ICD-10-CM | POA: Diagnosis not present

## 2018-11-21 DIAGNOSIS — F1120 Opioid dependence, uncomplicated: Secondary | ICD-10-CM | POA: Diagnosis not present

## 2018-11-21 DIAGNOSIS — M5416 Radiculopathy, lumbar region: Secondary | ICD-10-CM | POA: Diagnosis not present

## 2018-11-21 DIAGNOSIS — E119 Type 2 diabetes mellitus without complications: Secondary | ICD-10-CM | POA: Diagnosis not present

## 2018-11-21 DIAGNOSIS — F17290 Nicotine dependence, other tobacco product, uncomplicated: Secondary | ICD-10-CM | POA: Diagnosis not present

## 2018-11-21 DIAGNOSIS — G894 Chronic pain syndrome: Secondary | ICD-10-CM | POA: Diagnosis not present

## 2018-11-21 DIAGNOSIS — Z79899 Other long term (current) drug therapy: Secondary | ICD-10-CM | POA: Diagnosis not present

## 2018-11-21 DIAGNOSIS — Z729 Problem related to lifestyle, unspecified: Secondary | ICD-10-CM | POA: Diagnosis not present

## 2018-11-22 ENCOUNTER — Ambulatory Visit: Payer: Self-pay | Admitting: Urology

## 2018-11-22 DIAGNOSIS — I272 Pulmonary hypertension, unspecified: Secondary | ICD-10-CM | POA: Diagnosis not present

## 2018-11-22 DIAGNOSIS — R0609 Other forms of dyspnea: Secondary | ICD-10-CM | POA: Diagnosis not present

## 2018-11-22 DIAGNOSIS — G4733 Obstructive sleep apnea (adult) (pediatric): Secondary | ICD-10-CM | POA: Diagnosis not present

## 2018-11-22 DIAGNOSIS — J439 Emphysema, unspecified: Secondary | ICD-10-CM | POA: Diagnosis not present

## 2018-11-23 ENCOUNTER — Telehealth: Payer: Self-pay | Admitting: Urology

## 2018-11-23 ENCOUNTER — Encounter: Payer: Self-pay | Admitting: Urology

## 2018-11-23 NOTE — Telephone Encounter (Signed)
Would you call Megan Bolton to reschedule her missed appointment?  She is due for a repeat RUS at this time.

## 2018-11-23 NOTE — Telephone Encounter (Signed)
Patient called lm to cb App rescheduled scheduling will call to schedule the [place]

## 2018-11-25 DIAGNOSIS — G8929 Other chronic pain: Secondary | ICD-10-CM | POA: Diagnosis not present

## 2018-11-25 DIAGNOSIS — E119 Type 2 diabetes mellitus without complications: Secondary | ICD-10-CM | POA: Diagnosis not present

## 2018-11-25 DIAGNOSIS — E785 Hyperlipidemia, unspecified: Secondary | ICD-10-CM | POA: Diagnosis not present

## 2018-11-25 DIAGNOSIS — I5032 Chronic diastolic (congestive) heart failure: Secondary | ICD-10-CM | POA: Diagnosis not present

## 2018-11-25 DIAGNOSIS — I110 Hypertensive heart disease with heart failure: Secondary | ICD-10-CM | POA: Diagnosis not present

## 2018-11-25 DIAGNOSIS — D649 Anemia, unspecified: Secondary | ICD-10-CM | POA: Diagnosis not present

## 2018-11-25 DIAGNOSIS — J9621 Acute and chronic respiratory failure with hypoxia: Secondary | ICD-10-CM | POA: Diagnosis not present

## 2018-11-25 DIAGNOSIS — J449 Chronic obstructive pulmonary disease, unspecified: Secondary | ICD-10-CM | POA: Diagnosis not present

## 2018-11-25 DIAGNOSIS — Z7951 Long term (current) use of inhaled steroids: Secondary | ICD-10-CM | POA: Diagnosis not present

## 2018-11-29 DIAGNOSIS — E785 Hyperlipidemia, unspecified: Secondary | ICD-10-CM | POA: Diagnosis not present

## 2018-11-29 DIAGNOSIS — I110 Hypertensive heart disease with heart failure: Secondary | ICD-10-CM | POA: Diagnosis not present

## 2018-11-29 DIAGNOSIS — J9621 Acute and chronic respiratory failure with hypoxia: Secondary | ICD-10-CM | POA: Diagnosis not present

## 2018-11-29 DIAGNOSIS — G8929 Other chronic pain: Secondary | ICD-10-CM | POA: Diagnosis not present

## 2018-11-29 DIAGNOSIS — I5032 Chronic diastolic (congestive) heart failure: Secondary | ICD-10-CM | POA: Diagnosis not present

## 2018-11-29 DIAGNOSIS — D649 Anemia, unspecified: Secondary | ICD-10-CM | POA: Diagnosis not present

## 2018-11-29 DIAGNOSIS — Z7951 Long term (current) use of inhaled steroids: Secondary | ICD-10-CM | POA: Diagnosis not present

## 2018-11-29 DIAGNOSIS — J449 Chronic obstructive pulmonary disease, unspecified: Secondary | ICD-10-CM | POA: Diagnosis not present

## 2018-11-29 DIAGNOSIS — E119 Type 2 diabetes mellitus without complications: Secondary | ICD-10-CM | POA: Diagnosis not present

## 2018-12-02 DIAGNOSIS — J9621 Acute and chronic respiratory failure with hypoxia: Secondary | ICD-10-CM | POA: Diagnosis not present

## 2018-12-02 DIAGNOSIS — Z7951 Long term (current) use of inhaled steroids: Secondary | ICD-10-CM | POA: Diagnosis not present

## 2018-12-02 DIAGNOSIS — E119 Type 2 diabetes mellitus without complications: Secondary | ICD-10-CM | POA: Diagnosis not present

## 2018-12-02 DIAGNOSIS — J449 Chronic obstructive pulmonary disease, unspecified: Secondary | ICD-10-CM | POA: Diagnosis not present

## 2018-12-02 DIAGNOSIS — I110 Hypertensive heart disease with heart failure: Secondary | ICD-10-CM | POA: Diagnosis not present

## 2018-12-02 DIAGNOSIS — E785 Hyperlipidemia, unspecified: Secondary | ICD-10-CM | POA: Diagnosis not present

## 2018-12-02 DIAGNOSIS — D649 Anemia, unspecified: Secondary | ICD-10-CM | POA: Diagnosis not present

## 2018-12-02 DIAGNOSIS — G8929 Other chronic pain: Secondary | ICD-10-CM | POA: Diagnosis not present

## 2018-12-02 DIAGNOSIS — I5032 Chronic diastolic (congestive) heart failure: Secondary | ICD-10-CM | POA: Diagnosis not present

## 2018-12-06 DIAGNOSIS — G8929 Other chronic pain: Secondary | ICD-10-CM | POA: Diagnosis not present

## 2018-12-06 DIAGNOSIS — J449 Chronic obstructive pulmonary disease, unspecified: Secondary | ICD-10-CM | POA: Diagnosis not present

## 2018-12-06 DIAGNOSIS — I110 Hypertensive heart disease with heart failure: Secondary | ICD-10-CM | POA: Diagnosis not present

## 2018-12-06 DIAGNOSIS — I5032 Chronic diastolic (congestive) heart failure: Secondary | ICD-10-CM | POA: Diagnosis not present

## 2018-12-06 DIAGNOSIS — Z7951 Long term (current) use of inhaled steroids: Secondary | ICD-10-CM | POA: Diagnosis not present

## 2018-12-06 DIAGNOSIS — E119 Type 2 diabetes mellitus without complications: Secondary | ICD-10-CM | POA: Diagnosis not present

## 2018-12-06 DIAGNOSIS — J9621 Acute and chronic respiratory failure with hypoxia: Secondary | ICD-10-CM | POA: Diagnosis not present

## 2018-12-06 DIAGNOSIS — D649 Anemia, unspecified: Secondary | ICD-10-CM | POA: Diagnosis not present

## 2018-12-06 DIAGNOSIS — E785 Hyperlipidemia, unspecified: Secondary | ICD-10-CM | POA: Diagnosis not present

## 2018-12-07 ENCOUNTER — Emergency Department: Payer: Medicare HMO

## 2018-12-07 ENCOUNTER — Encounter: Payer: Self-pay | Admitting: Emergency Medicine

## 2018-12-07 ENCOUNTER — Inpatient Hospital Stay
Admission: EM | Admit: 2018-12-07 | Discharge: 2018-12-11 | DRG: 190 | Disposition: A | Discharge status: Home Health | Payer: Medicare HMO | Attending: Internal Medicine | Admitting: Internal Medicine

## 2018-12-07 ENCOUNTER — Other Ambulatory Visit: Payer: Self-pay

## 2018-12-07 DIAGNOSIS — Z881 Allergy status to other antibiotic agents status: Secondary | ICD-10-CM

## 2018-12-07 DIAGNOSIS — Z7951 Long term (current) use of inhaled steroids: Secondary | ICD-10-CM

## 2018-12-07 DIAGNOSIS — I110 Hypertensive heart disease with heart failure: Secondary | ICD-10-CM | POA: Diagnosis not present

## 2018-12-07 DIAGNOSIS — E1151 Type 2 diabetes mellitus with diabetic peripheral angiopathy without gangrene: Secondary | ICD-10-CM | POA: Diagnosis present

## 2018-12-07 DIAGNOSIS — Z9989 Dependence on other enabling machines and devices: Secondary | ICD-10-CM | POA: Diagnosis not present

## 2018-12-07 DIAGNOSIS — I10 Essential (primary) hypertension: Secondary | ICD-10-CM | POA: Diagnosis not present

## 2018-12-07 DIAGNOSIS — Z79899 Other long term (current) drug therapy: Secondary | ICD-10-CM | POA: Diagnosis not present

## 2018-12-07 DIAGNOSIS — Z7984 Long term (current) use of oral hypoglycemic drugs: Secondary | ICD-10-CM | POA: Diagnosis not present

## 2018-12-07 DIAGNOSIS — J9621 Acute and chronic respiratory failure with hypoxia: Secondary | ICD-10-CM | POA: Diagnosis not present

## 2018-12-07 DIAGNOSIS — Z7982 Long term (current) use of aspirin: Secondary | ICD-10-CM

## 2018-12-07 DIAGNOSIS — J441 Chronic obstructive pulmonary disease with (acute) exacerbation: Principal | ICD-10-CM | POA: Diagnosis present

## 2018-12-07 DIAGNOSIS — F17200 Nicotine dependence, unspecified, uncomplicated: Secondary | ICD-10-CM | POA: Diagnosis present

## 2018-12-07 DIAGNOSIS — E785 Hyperlipidemia, unspecified: Secondary | ICD-10-CM | POA: Diagnosis present

## 2018-12-07 DIAGNOSIS — G4733 Obstructive sleep apnea (adult) (pediatric): Secondary | ICD-10-CM | POA: Diagnosis present

## 2018-12-07 DIAGNOSIS — I5033 Acute on chronic diastolic (congestive) heart failure: Secondary | ICD-10-CM | POA: Diagnosis present

## 2018-12-07 DIAGNOSIS — E7800 Pure hypercholesterolemia, unspecified: Secondary | ICD-10-CM | POA: Diagnosis not present

## 2018-12-07 DIAGNOSIS — J9610 Chronic respiratory failure, unspecified whether with hypoxia or hypercapnia: Secondary | ICD-10-CM | POA: Diagnosis not present

## 2018-12-07 DIAGNOSIS — E119 Type 2 diabetes mellitus without complications: Secondary | ICD-10-CM

## 2018-12-07 DIAGNOSIS — R0602 Shortness of breath: Secondary | ICD-10-CM | POA: Diagnosis not present

## 2018-12-07 DIAGNOSIS — Z9981 Dependence on supplemental oxygen: Secondary | ICD-10-CM | POA: Diagnosis not present

## 2018-12-07 DIAGNOSIS — J449 Chronic obstructive pulmonary disease, unspecified: Secondary | ICD-10-CM | POA: Diagnosis not present

## 2018-12-07 DIAGNOSIS — J438 Other emphysema: Secondary | ICD-10-CM | POA: Diagnosis not present

## 2018-12-07 LAB — CBC
HEMATOCRIT: 42.4 % (ref 36.0–46.0)
Hemoglobin: 13.2 g/dL (ref 12.0–15.0)
MCH: 28.8 pg (ref 26.0–34.0)
MCHC: 31.1 g/dL (ref 30.0–36.0)
MCV: 92.6 fL (ref 80.0–100.0)
Platelets: 214 10*3/uL (ref 150–400)
RBC: 4.58 MIL/uL (ref 3.87–5.11)
RDW: 13.2 % (ref 11.5–15.5)
WBC: 16.5 10*3/uL — ABNORMAL HIGH (ref 4.0–10.5)
nRBC: 0 % (ref 0.0–0.2)

## 2018-12-07 LAB — BASIC METABOLIC PANEL
Anion gap: 8 (ref 5–15)
BUN: 22 mg/dL (ref 8–23)
CALCIUM: 9.2 mg/dL (ref 8.9–10.3)
CHLORIDE: 101 mmol/L (ref 98–111)
CO2: 29 mmol/L (ref 22–32)
Creatinine, Ser: 0.91 mg/dL (ref 0.44–1.00)
GLUCOSE: 148 mg/dL — AB (ref 70–99)
Potassium: 4.2 mmol/L (ref 3.5–5.1)
SODIUM: 138 mmol/L (ref 135–145)

## 2018-12-07 LAB — INFLUENZA PANEL BY PCR (TYPE A & B)
INFLBPCR: NEGATIVE
Influenza A By PCR: NEGATIVE

## 2018-12-07 LAB — BRAIN NATRIURETIC PEPTIDE: B Natriuretic Peptide: 253 pg/mL — ABNORMAL HIGH (ref 0.0–100.0)

## 2018-12-07 MED ORDER — ENOXAPARIN SODIUM 40 MG/0.4ML ~~LOC~~ SOLN
40.0000 mg | SUBCUTANEOUS | Status: DC
  Administered 2018-12-08 – 2018-12-10 (×3): 40 mg via SUBCUTANEOUS
  Filled 2018-12-07 (×2): qty 0.4

## 2018-12-07 MED ORDER — PANTOPRAZOLE SODIUM 40 MG PO TBEC
40.0000 mg | DELAYED_RELEASE_TABLET | Freq: Two times a day (BID) | ORAL | Status: DC
  Administered 2018-12-08 – 2018-12-11 (×7): 40 mg via ORAL
  Filled 2018-12-07 (×7): qty 1

## 2018-12-07 MED ORDER — IPRATROPIUM-ALBUTEROL 0.5-2.5 (3) MG/3ML IN SOLN
3.0000 mL | RESPIRATORY_TRACT | Status: DC | PRN
  Administered 2018-12-08 (×2): 3 mL via RESPIRATORY_TRACT
  Filled 2018-12-07 (×2): qty 3

## 2018-12-07 MED ORDER — FUROSEMIDE 20 MG PO TABS
20.0000 mg | ORAL_TABLET | Freq: Every day | ORAL | Status: DC
  Administered 2018-12-08: 11:00:00 20 mg via ORAL
  Filled 2018-12-07: qty 1

## 2018-12-07 MED ORDER — IPRATROPIUM-ALBUTEROL 0.5-2.5 (3) MG/3ML IN SOLN
3.0000 mL | Freq: Once | RESPIRATORY_TRACT | Status: AC
  Administered 2018-12-07: 3 mL via RESPIRATORY_TRACT
  Filled 2018-12-07: qty 3

## 2018-12-07 MED ORDER — ACETAMINOPHEN 325 MG PO TABS
650.0000 mg | ORAL_TABLET | Freq: Four times a day (QID) | ORAL | Status: DC | PRN
  Administered 2018-12-08 – 2018-12-10 (×4): 650 mg via ORAL
  Filled 2018-12-07 (×4): qty 2

## 2018-12-07 MED ORDER — ONDANSETRON HCL 4 MG/2ML IJ SOLN: 4.0000 mg | Freq: Four times a day (QID) | INTRAMUSCULAR | Status: DC | PRN

## 2018-12-07 MED ORDER — MOMETASONE FURO-FORMOTEROL FUM 200-5 MCG/ACT IN AERO
2.0000 | INHALATION_SPRAY | Freq: Two times a day (BID) | RESPIRATORY_TRACT | Status: DC
  Administered 2018-12-08 – 2018-12-11 (×7): 2 via RESPIRATORY_TRACT
  Filled 2018-12-07: qty 8.8

## 2018-12-07 MED ORDER — ONDANSETRON HCL 4 MG PO TABS: 4.0000 mg | ORAL_TABLET | Freq: Four times a day (QID) | ORAL | Status: DC | PRN

## 2018-12-07 MED ORDER — GABAPENTIN 400 MG PO CAPS
800.0000 mg | ORAL_CAPSULE | Freq: Three times a day (TID) | ORAL | Status: DC
  Administered 2018-12-08 – 2018-12-09 (×4): 800 mg via ORAL
  Filled 2018-12-07 (×5): qty 2
  Filled 2018-12-07 (×3): qty 8
  Filled 2018-12-07: qty 2

## 2018-12-07 MED ORDER — INSULIN ASPART 100 UNIT/ML ~~LOC~~ SOLN
0.0000 [IU] | Freq: Every day | SUBCUTANEOUS | Status: DC
  Administered 2018-12-09 – 2018-12-10 (×2): 2 [IU] via SUBCUTANEOUS
  Filled 2018-12-07 (×2): qty 1

## 2018-12-07 MED ORDER — AZITHROMYCIN 500 MG PO TABS: 500.0000 mg | ORAL_TABLET | Freq: Every day | ORAL | Status: DC

## 2018-12-07 MED ORDER — ALBUTEROL SULFATE (2.5 MG/3ML) 0.083% IN NEBU
2.5000 mg | INHALATION_SOLUTION | RESPIRATORY_TRACT | Status: AC
  Administered 2018-12-07: 2.5 mg via RESPIRATORY_TRACT
  Filled 2018-12-07: qty 3

## 2018-12-07 MED ORDER — TIOTROPIUM BROMIDE MONOHYDRATE 18 MCG IN CAPS
18.0000 ug | ORAL_CAPSULE | Freq: Every day | RESPIRATORY_TRACT | Status: DC
  Administered 2018-12-08: 18 ug via RESPIRATORY_TRACT
  Filled 2018-12-07: qty 5

## 2018-12-07 MED ORDER — ACETAMINOPHEN 650 MG RE SUPP: 650.0000 mg | Freq: Four times a day (QID) | RECTAL | Status: DC | PRN

## 2018-12-07 MED ORDER — ALBUTEROL SULFATE (2.5 MG/3ML) 0.083% IN NEBU
INHALATION_SOLUTION | RESPIRATORY_TRACT | Status: AC
  Filled 2018-12-07: qty 3

## 2018-12-07 MED ORDER — ALBUTEROL SULFATE (2.5 MG/3ML) 0.083% IN NEBU
5.0000 mg | INHALATION_SOLUTION | Freq: Once | RESPIRATORY_TRACT | Status: AC
  Administered 2018-12-07: 5 mg via RESPIRATORY_TRACT
  Filled 2018-12-07: qty 6

## 2018-12-07 MED ORDER — INSULIN ASPART 100 UNIT/ML ~~LOC~~ SOLN: 0.0000 [IU] | Freq: Three times a day (TID) | SUBCUTANEOUS | Status: DC

## 2018-12-07 MED ORDER — CLONAZEPAM 0.5 MG PO TABS
0.5000 mg | ORAL_TABLET | Freq: Every evening | ORAL | Status: DC | PRN
  Administered 2018-12-08 – 2018-12-10 (×3): 0.5 mg via ORAL
  Filled 2018-12-07 (×3): qty 1

## 2018-12-07 MED ORDER — MONTELUKAST SODIUM 10 MG PO TABS
10.0000 mg | ORAL_TABLET | Freq: Every day | ORAL | Status: DC
  Administered 2018-12-08 – 2018-12-10 (×3): 10 mg via ORAL
  Filled 2018-12-07 (×3): qty 1

## 2018-12-07 MED ORDER — OXYCODONE HCL ER 10 MG PO T12A
10.0000 mg | EXTENDED_RELEASE_TABLET | Freq: Two times a day (BID) | ORAL | Status: DC
  Administered 2018-12-08 – 2018-12-11 (×7): 10 mg via ORAL
  Filled 2018-12-07 (×7): qty 1

## 2018-12-07 MED ORDER — PREDNISONE 20 MG PO TABS
40.0000 mg | ORAL_TABLET | Freq: Every day | ORAL | Status: DC
  Administered 2018-12-08: 40 mg via ORAL
  Filled 2018-12-07: qty 2

## 2018-12-07 MED ORDER — LEVOFLOXACIN 500 MG PO TABS
250.0000 mg | ORAL_TABLET | Freq: Once | ORAL | Status: AC
  Administered 2018-12-07: 250 mg via ORAL
  Filled 2018-12-07: qty 1

## 2018-12-07 MED ORDER — IPRATROPIUM-ALBUTEROL 0.5-2.5 (3) MG/3ML IN SOLN: 3.0000 mL | Freq: Once | RESPIRATORY_TRACT | Status: DC

## 2018-12-07 MED ORDER — ASPIRIN EC 81 MG PO TBEC
81.0000 mg | DELAYED_RELEASE_TABLET | Freq: Every day | ORAL | Status: DC
  Administered 2018-12-08 – 2018-12-11 (×4): 81 mg via ORAL
  Filled 2018-12-07 (×4): qty 1

## 2018-12-07 MED ORDER — IRBESARTAN 75 MG PO TABS
75.0000 mg | ORAL_TABLET | Freq: Every day | ORAL | Status: DC
  Administered 2018-12-08 – 2018-12-11 (×4): 75 mg via ORAL
  Filled 2018-12-07 (×5): qty 1

## 2018-12-07 MED ORDER — PREDNISONE 20 MG PO TABS
40.0000 mg | ORAL_TABLET | Freq: Once | ORAL | Status: AC
  Administered 2018-12-07: 40 mg via ORAL
  Filled 2018-12-07: qty 2

## 2018-12-07 MED ORDER — CARVEDILOL 25 MG PO TABS
12.5000 mg | ORAL_TABLET | Freq: Every day | ORAL | Status: DC
  Administered 2018-12-08 – 2018-12-11 (×4): 12.5 mg via ORAL
  Filled 2018-12-07 (×2): qty 1
  Filled 2018-12-07: qty 2
  Filled 2018-12-07: qty 1
  Filled 2018-12-07: qty 4
  Filled 2018-12-07: qty 2

## 2018-12-07 NOTE — ED Triage Notes (Signed)
Pt via pov from home with sob since yesterday. Pt has copd and is on 2L O2 chronically. Pt O2 sat 89% on 2L at triage. Pt has congested cough as well. NAD noted.

## 2018-12-07 NOTE — ED Notes (Signed)
Report received from Amy, RN.

## 2018-12-07 NOTE — ED Notes (Signed)
ED Provider at bedside. 

## 2018-12-07 NOTE — ED Notes (Signed)
Report off to allie rn  

## 2018-12-07 NOTE — ED Notes (Signed)
Answered call bell. Pt required assistance to get to bedside commode. Pt able to pass urine. Pt returned to bed and resting comfortably.

## 2018-12-07 NOTE — ED Notes (Signed)
Pt states she started feeling bad yesterday.  Pt reports sob.  No chest pain.  Pt also has chest congestion.  Hx copd , chf.  Pt on 2 liters oxygen at home.  nsr on monitor at 87.  Pt alert.  Speech clear.  Family with pt

## 2018-12-07 NOTE — H&P (Signed)
[facility] NAME: Megan Bolton    MR#:  [id number]  DATE OF BIRTH:  1941-01-20  DATE OF ADMISSION:  12/07/2018  PRIMARY CARE PHYSICIAN: Baxter Hire, MD   REQUESTING/REFERRING PHYSICIAN: Jacqualine Code, MD  CHIEF COMPLAINT:   Chief Complaint  Patient presents with  . Shortness of Breath    HISTORY OF PRESENT ILLNESS:  Megan Bolton  is a 78 y.o. female who presents with chief complaint as above.  Patient presents with 2 days progressive shortness of breath with wheezing.  She took a couple doses of Levaquin 500 mg that she had leftover from her prior treatment course.  Her breathing was getting progressively worse, and here in the ED she had some hypoxia on ambulation.  Work-up is consistent with COPD with exacerbation.  Hospitalist called for admission  PAST MEDICAL HISTORY:   Past Medical History:  Diagnosis Date  . CHF (congestive heart failure) ([place])   . COPD (chronic obstructive pulmonary disease) ([place])   . Diabetes mellitus without complication ([place])   . Diastolic heart failure ([place])   . Hyperlipidemia   . Hypertension      PAST SURGICAL HISTORY:   Past Surgical History:  Procedure Laterality Date  . ABDOMINAL HYSTERECTOMY    . SPINE SURGERY       SOCIAL HISTORY:   Social History   Tobacco Use  . Smoking status: Former Smoker    Packs/day: 0.50    Last attempt to quit: 04/28/2015    Years since quitting: 3.6  . Smokeless tobacco: Never Used  Substance Use Topics  . Alcohol use: No    Alcohol/week: 0.0 standard drinks     FAMILY HISTORY:   Family History  Problem Relation Age of Onset  . Hypertension Mother   . Colon cancer Father   . Hypertension Paternal Grandfather   . Bladder Cancer Neg Hx   . Kidney cancer Neg Hx      DRUG ALLERGIES:   Allergies  Allergen Reactions  . Nitrofurantoin Anaphylaxis    MEDICATIONS AT HOME:   Prior to Admission medications   Medication Sig  Start Date End Date Taking? Authorizing Provider  albuterol (PROVENTIL) (2.5 MG/3ML) 0.083% nebulizer solution Take 3 mLs (2.5 mg total) by nebulization every 6 (six) hours as needed for wheezing or shortness of breath. 04/08/18  Yes Delman Kitten, MD  albuterol (VENTOLIN HFA) 108 (90 Base) MCG/ACT inhaler Inhale 2 puffs into the lungs every 6 (six) hours as needed for wheezing or shortness of breath. 05/12/17  Yes Karamalegos, Devonne Doughty, DO  aspirin EC 81 MG tablet Take 81 mg by mouth daily.    Yes [provider]  carvedilol (COREG) 12.5 MG tablet Take 1 tablet (12.5 mg total) by mouth 2 (two) times daily. Patient taking differently: Take 12.5 mg by mouth daily.  09/28/18  Yes Vaughan Basta, MD  clonazePAM (KLONOPIN) 0.5 MG tablet Take 0.5 mg by mouth at bedtime as needed.  07/04/15  Yes [provider]  cloNIDine (CATAPRES) 0.1 MG tablet 1 tablet daily as needed for systolic blood pressure (top number) greater than 124 or diastolic blood pressure (bottom number) greater than 105 11/09/18  Yes Wieting, Richard, MD  Fluticasone-Salmeterol (ADVAIR DISKUS) 250-50 MCG/DOSE AEPB Inhale 1 puff into the lungs 2 (two) times daily. 05/04/17  Yes Karamalegos, Devonne Doughty, DO  furosemide (LASIX) 20 MG tablet Alternate one pill orally every other day, 2 pills po every onther day Patient taking  differently: Take 20 mg by mouth daily. Take one tablet by mouth daily, take 2 tablets by mouth only if needed for swelling 11/09/18  Yes Wieting, Richard, MD  gabapentin (NEURONTIN) 800 MG tablet Take 1 tablet (800 mg total) by mouth 3 (three) times daily. 12/01/16  Yes Arlis Porta., MD  irbesartan (AVAPRO) 75 MG tablet Take 1 tablet (75 mg total) by mouth daily. 11/09/18 11/09/19 Yes Wieting, Richard, MD  metFORMIN (GLUCOPHAGE) 500 MG tablet TAKE ONE TABLET BY MOUTH EVERY MORNING WITH BREAKFAST Patient taking differently: Take 500 mg by mouth daily.  03/24/17  Yes Arlis Porta., MD   montelukast (SINGULAIR) 10 MG tablet TAKE ONE TABLET BY MOUTH AT BEDTIME 03/24/17  Yes Arlis Porta., MD  ondansetron (ZOFRAN-ODT) 4 MG disintegrating tablet Take 4 mg by mouth every 8 (eight) hours as needed for nausea/vomiting. 08/18/18  Yes [provider]  OXYCONTIN 10 MG 12 hr tablet Take 10 mg by mouth 2 (two) times daily.   Yes [provider]  polyethylene glycol (MIRALAX / GLYCOLAX) packet Take 17 g by mouth daily.  05/10/18  Yes [provider]  potassium chloride (K-DUR) 10 MEQ tablet Take 1 tablet (10 mEq total) by mouth 2 (two) times daily. 10/12/17  Yes Karamalegos, Alexander J, DO  tiotropium (SPIRIVA HANDIHALER) 18 MCG inhalation capsule Place 1 capsule (18 mcg total) into inhaler and inhale daily. 08/27/17  Yes Karamalegos, Devonne Doughty, DO  valACYclovir (VALTREX) 1000 MG tablet Take 1 tablet (1,000 mg total) by mouth daily as needed (for fever blisters). 04/19/16  Yes Gladstone Lighter, MD  ACCU-CHEK AVIVA PLUS test strip 1 each by Other route 2 (two) times daily. 09/08/17   Karamalegos, Devonne Doughty, DO  ACCU-CHEK SOFTCLIX LANCETS lancets 1 each by Other route 2 (two) times daily. 09/08/17   Karamalegos, Devonne Doughty, DO  Blood Glucose Monitoring Suppl (ACCU-CHEK AVIVA PLUS) w/Device KIT USE AS DIRECTED 01/12/17   Arlis Porta., MD  conjugated estrogens (PREMARIN) vaginal cream Apply 0.42m (pea-sized amount)  just inside the vaginal introitus with a finger-tip on  Monday, Wednesday and Friday nights. Patient not taking: Reported on 12/07/2018 07/20/18   MZara CouncilA, PA-C  OXYGEN Inhale 2 L into the lungs continuous.    [provider]  pantoprazole (PROTONIX) 40 MG tablet Take 40 mg by mouth 2 (two) times daily before a meal.  12/08/17 12/08/18  [provider]  sucralfate (CARAFATE) 1 g tablet Take 1 tablet by mouth 2 (two) times daily.  02/15/18 02/15/19  [provider]    REVIEW OF SYSTEMS:  Review of Systems   Constitutional: Negative for chills, fever, malaise/fatigue and weight loss.  HENT: Negative for ear pain, hearing loss and tinnitus.   Eyes: Negative for blurred vision, double vision, pain and redness.  Respiratory: Positive for cough, sputum production, shortness of breath and wheezing. Negative for hemoptysis.   Cardiovascular: Negative for chest pain, palpitations, orthopnea and leg swelling.  Gastrointestinal: Negative for abdominal pain, constipation, diarrhea, nausea and vomiting.  Genitourinary: Negative for dysuria, frequency and hematuria.  Musculoskeletal: Negative for back pain, joint pain and neck pain.  Skin:       No acne, rash, or lesions  Neurological: Negative for dizziness, tremors, focal weakness and weakness.  Endo/Heme/Allergies: Negative for polydipsia. Does not bruise/bleed easily.  Psychiatric/Behavioral: Negative for depression. The patient is not nervous/anxious and does not have insomnia.      VITAL SIGNS:   Vitals:  12/07/18 1915 12/07/18 2000 12/07/18 2100 12/07/18 2200  BP:  (!) 166/90 (!) 177/94 (!) 150/80  Pulse: 85 87 87 85  Resp: (!) 24 20 (!) 22 (!) 23  Temp:      TempSrc:      SpO2: 96% 95% 92% (!) 88%  Weight:      Height:       Wt Readings from Last 3 Encounters:  12/07/18 93.4 kg  11/09/18 93.8 kg  09/28/18 96.6 kg    PHYSICAL EXAMINATION:  Physical Exam  Vitals reviewed. Constitutional: She is oriented to person, place, and time. She appears well-developed and well-nourished. No distress.  HENT:  Head: Normocephalic and atraumatic.  Mouth/Throat: Oropharynx is clear and moist.  Eyes: Pupils are equal, round, and reactive to light. Conjunctivae and EOM are normal. No scleral icterus.  Neck: Normal range of motion. Neck supple. No JVD present. No thyromegaly present.  Cardiovascular: Normal rate, regular rhythm and intact distal pulses. Exam reveals no gallop and no friction rub.  No murmur heard. Respiratory: Effort normal. No  respiratory distress. She has wheezes. She has no rales.  GI: Soft. Bowel sounds are normal. She exhibits no distension. There is no tenderness.  Musculoskeletal: Normal range of motion. She exhibits no edema.  No arthritis, no gout  Lymphadenopathy:    She has no cervical adenopathy.  Neurological: She is alert and oriented to person, place, and time. No cranial nerve deficit.  No dysarthria, no aphasia  Skin: Skin is warm and dry. No rash noted. No erythema.  Psychiatric: She has a normal mood and affect. Her behavior is normal. Judgment and thought content normal.    LABORATORY PANEL:   CBC Recent Labs  Lab 12/07/18 1837  WBC 16.5*  HGB 13.2  HCT 42.4  PLT 214   ------------------------------------------------------------------------------------------------------------------  Chemistries  Recent Labs  Lab 12/07/18 1837  NA 138  K 4.2  CL 101  CO2 29  GLUCOSE 148*  BUN 22  CREATININE 0.91  CALCIUM 9.2   ------------------------------------------------------------------------------------------------------------------  Cardiac Enzymes No results for input(s): TROPONINI in the last 168 hours. ------------------------------------------------------------------------------------------------------------------  RADIOLOGY:  Dg Chest 2 View  Result Date: 12/07/2018 CLINICAL DATA:  Shortness of breath EXAM: CHEST - 2 VIEW COMPARISON:  11/06/2018, CT 11/08/2018, radiograph 12/19/2017 FINDINGS: Mild cardiomegaly and slight central congestion. Trace pleural effusions. Aortic atherosclerosis. No pneumothorax. IMPRESSION: 1. Cardiomegaly with central congestion and trace pleural effusions. No focal airspace disease. Electronically Signed   By: Donavan Foil M.D.   On: 12/07/2018 18:22    EKG:   Orders placed or performed during the hospital encounter of 12/07/18  . EKG 12-Lead  . EKG 12-Lead  . ED EKG  . ED EKG    IMPRESSION AND PLAN:  Principal Problem:   COPD with  acute exacerbation ([place]) -patient was given prednisone, duo nebs in the ED.  We will continue treatment with steroid, azithromycin, nebs, and home COPD medications Active Problems:   HTN (hypertension) -home dose antihypertensives   OSA on CPAP -CPAP nightly   Diabetes ([place]) -sliding scale insulin coverage   Hypercholesteremia -Home dose antilipid  Chart review performed and case discussed with ED provider. Labs, imaging and/or ECG reviewed by provider and discussed with patient/family. Management plans discussed with the patient and/or family.  DVT PROPHYLAXIS: SubQ lovenox   GI PROPHYLAXIS:  PPI   ADMISSION STATUS: Inpatient     CODE STATUS: Full Code Status History    Date Active Date Inactive Code Status Order ID  Comments User Context   11/06/2018 2236 11/09/2018 1517 Full Code [id number]  Vaughan Basta, MD Inpatient   09/25/2018 2129 09/28/2018 1737 Full Code [id number]  Sela Hua, MD Inpatient   05/25/2018 0527 05/25/2018 1825 Full Code [id number]  Arta Silence, MD Inpatient   12/19/2017 0514 12/19/2017 1813 Full Code [id number]  Saundra Shelling, MD Inpatient   08/30/2017 1021 08/31/2017 1500 Full Code [id number]  Bettey Costa, MD Inpatient   04/18/2016 0142 04/19/2016 1732 Full Code [id number]  Lance Coon, MD ED   02/28/2016 0540 03/01/2016 1739 Full Code [id number]  Saundra Shelling, MD Inpatient      TOTAL TIME TAKING CARE OF THIS PATIENT: 45 minutes.   Jannifer Franklin, Jayline [place] 12/07/2018, 10:42 PM  [facility]  Office  [phone]  CC: Primary care physician; Baxter Hire, MD  Note:  This document was prepared using Dragon voice recognition software and may include unintentional dictation errors.

## 2018-12-07 NOTE — ED Notes (Signed)
Pt up to bathroom.

## 2018-12-07 NOTE — ED Provider Notes (Signed)
[facility] Emergency Department Provider Note ____________________________________________   First MD Initiated Contact with Patient 12/07/18 1847     (approximate)  I have reviewed the triage vital signs and the nursing notes.   HISTORY  Chief Complaint Shortness of Breath  HPI Megan Bolton is a 77 y.o. female presents for evaluation of shortness of breath cough and productive sputum  Patient reports for 2 days now she is felt slight chills, had a increasingly productive cough slight shortness of breath and a runny nose.  She has been noticing she is pretty short of breath when she is trying to exert herself.  No pain.  She also reports she does not think it is from heart problems, her legs are not swollen, she is been taking her Lasix, does not seem to get worse when she lays down.  The productive sputum is mostly yellowish, slight green.  Patient started taking 500 mg Levaquin tablets daily yesterday and then this morning as well.  She reports she had some leftover from previous possible urinary infection that was several months ago  Denies taking any other recent antibiotics  Past Medical History:  Diagnosis Date  . CHF (congestive heart failure) ([place])   . COPD (chronic obstructive pulmonary disease) ([place])   . Diabetes mellitus without complication ([place])   . Diastolic heart failure ([place])   . Hyperlipidemia   . Hypertension     Patient Active Problem List   Diagnosis Date Noted  . Acute on chronic diastolic CHF (congestive heart failure) ([place]) 11/06/2018  . Acute on chronic respiratory failure with hypoxia ([place]) 09/25/2018  . Acute hypoxemic respiratory failure ([place]) 05/25/2018  . CHF (congestive heart failure) ([place]) 12/19/2017  . Restless leg syndrome 09/07/2017  . CHF exacerbation ([place]) 08/30/2017  . Adjustment disorder with mixed anxiety and depressed mood 08/13/2017  . Peripheral neuropathy 07/14/2017  . Malignant hypertension 01/29/2017   . Chronic respiratory failure with hypoxia, on home O2 therapy ([place]) 12/24/2016  . Incomplete emptying of bladder 07/19/2016  . Bacteriuria 07/17/2016  . Infection due to ESBL-producing Escherichia coli 04/21/2016  . Frequent UTI 04/21/2016  . OSA on CPAP 04/18/2016  . Bladder infection, chronic 04/17/2016  . Calculus of kidney 04/17/2016  . Community acquired pneumonia 02/28/2016  . [name] (chronic kidney disease) 07/12/2015  . Controlled type 2 diabetes mellitus with diabetic neuropathy, without long-term current use of insulin ([place]) 06/11/2015  . Arthritis, degenerative 06/11/2015  . Hypercholesteremia 06/11/2015  . 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 Injury of kidney 04/14/2015  . Uncontrolled hypertension 04/14/2015  . Blood glucose elevated 04/14/2015  . Apnea, sleep 04/14/2015  . COPD (chronic obstructive pulmonary disease) ([place]) 04/14/2015  . DDD (degenerative disc disease), lumbar 08/01/2014  . Lumbar canal stenosis 05/11/2014  . Neuritis or radiculitis due to rupture of lumbar intervertebral disc 05/11/2014  . 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 Lumbosacral spondylosis 05/11/2014  . Thoracic and lumbosacral neuritis 05/11/2014  . Accelerating angina ([place]) 03/13/2014    Past Surgical History:  Procedure Laterality Date  . ABDOMINAL HYSTERECTOMY    . SPINE SURGERY      Prior to Admission medications   Medication Sig Start Date End Date Taking? Authorizing Provider  ACCU-CHEK AVIVA PLUS test strip 1 each by Other route 2 (two) times daily. 09/08/17   Karamalegos, Devonne Doughty, DO  ACCU-CHEK SOFTCLIX LANCETS lancets 1 each by Other route 2 (two) times daily. 09/08/17   Olin Hauser, DO  albuterol (PROVENTIL) (2.5 MG/3ML) 0.083% nebulizer solution Take 3 mLs (2.5 mg total) by nebulization every 6 (six) hours as needed for wheezing or shortness of breath. 04/08/18   Delman Kitten, MD  albuterol (VENTOLIN HFA) 108 (90 Base)  MCG/ACT inhaler Inhale 2 puffs into the lungs every 6 (six) hours as needed for wheezing or shortness of breath. 05/12/17   Karamalegos, Devonne Doughty, DO  aspirin EC 81 MG tablet Take 325 mg by mouth daily.     [provider]  Blood Glucose Monitoring Suppl (ACCU-CHEK AVIVA PLUS) w/Device KIT USE AS DIRECTED 01/12/17   Arlis Porta., MD  carvedilol (COREG) 12.5 MG tablet Take 1 tablet (12.5 mg total) by mouth 2 (two) times daily. 09/28/18   Vaughan Basta, MD  clonazePAM (KLONOPIN) 0.5 MG tablet Take 0.5 mg by mouth at bedtime as needed.  07/04/15   [provider]  cloNIDine (CATAPRES) 0.1 MG tablet 1 tablet daily as needed for systolic blood pressure (top number) greater than 295 or diastolic blood pressure (bottom number) greater than 105 11/09/18   Wieting, Richard, MD  conjugated estrogens (PREMARIN) vaginal cream Apply 0.57m (pea-sized amount)  just inside the vaginal introitus with a finger-tip on  Monday, Wednesday and Friday nights. 07/20/18   MZara CouncilA, PA-C  Fluticasone-Salmeterol (ADVAIR DISKUS) 250-50 MCG/DOSE AEPB Inhale 1 puff into the lungs 2 (two) times daily. 05/04/17   Karamalegos, ADevonne Doughty DO  furosemide (LASIX) 20 MG tablet Alternate one pill orally every other day, 2 pills po every onther day 11/09/18   WLoletha Grayer MD  gabapentin (NEURONTIN) 800 MG tablet Take 1 tablet (800 mg total) by mouth 3 (three) times daily. 12/01/16   HArlis Porta, MD  irbesartan (AVAPRO) 75 MG tablet Take 1 tablet (75 mg total) by mouth daily. 11/09/18 11/09/19  WLoletha Grayer MD  metFORMIN (GLUCOPHAGE) 500 MG tablet TAKE ONE TABLET BY MOUTH EVERY MORNING WITH BREAKFAST Patient taking differently: Take 500 mg by mouth daily.  03/24/17   HArlis Porta, MD  montelukast (SINGULAIR) 10 MG tablet TAKE ONE TABLET BY MOUTH AT BEDTIME 03/24/17   HArlis Porta, MD  ondansetron (ZOFRAN-ODT) 4 MG disintegrating tablet Take 4 mg by mouth every 8  (eight) hours as needed for nausea/vomiting. 08/18/18   [provider]  OXYCONTIN 10 MG 12 hr tablet Take 10 mg by mouth 2 (two) times daily.    [provider]  OXYGEN Inhale 2 L into the lungs continuous.    [provider]  pantoprazole (PROTONIX) 40 MG tablet Take 40 mg by mouth 2 (two) times daily before a meal.  12/08/17 12/08/18  [provider]  polyethylene glycol (MIRALAX / GLYCOLAX) packet Take 17 g by mouth daily.  05/10/18   [provider]  potassium chloride (K-DUR) 10 MEQ tablet Take 1 tablet (10 mEq total) by mouth 2 (two) times daily. 10/12/17   Karamalegos, ADevonne Doughty DO  sucralfate (CARAFATE) 1 g tablet Take 1 tablet by mouth 2 (two) times daily.  02/15/18 02/15/19  [provider]  tiotropium (SPIRIVA HANDIHALER) 18 MCG inhalation capsule Place 1 capsule (18 mcg total) into inhaler and inhale daily. 08/27/17   Karamalegos, ADevonne Doughty DO  valACYclovir (VALTREX) 1000 MG tablet Take 1 tablet (1,000 mg total) by mouth daily as needed (for fever blisters). 04/19/16   KGladstone Lighter MD    Allergies Nitrofurantoin  Family History  Problem Relation Age of Onset  . Hypertension Mother   .  Colon cancer Father   . Hypertension Paternal Grandfather   . Bladder Cancer Neg Hx   . Kidney cancer Neg Hx     Social History Social History   Tobacco Use  . Smoking status: Former Smoker    Packs/day: 0.50    Last attempt to quit: 04/28/2015    Years since quitting: 3.6  . Smokeless tobacco: Never Used  Substance Use Topics  . Alcohol use: No    Alcohol/week: 0.0 standard drinks  . Drug use: No    Review of Systems Constitutional: No fever some chills and fatigue Eyes: No visual changes. ENT: No sore throat. Cardiovascular: Denies chest pain. Respiratory: Shortness of breath when she tries to walk or exert herself at all. Gastrointestinal: No abdominal pain.   Genitourinary: Negative for dysuria. Musculoskeletal:  Negative for back pain.  No leg swelling.  Reports her weight has not gone up that she is aware of Skin: Negative for rash. Neurological: Negative for headaches, areas of focal weakness or numbness.    ____________________________________________   PHYSICAL EXAM:  VITAL SIGNS: ED Triage Vitals  Enc Vitals Group     BP 12/07/18 1724 (!) 181/62     Pulse Rate 12/07/18 1724 92     Resp 12/07/18 1724 (!) 28     Temp 12/07/18 1724 99.1 F (37.3 C)     Temp Source 12/07/18 1724 Oral     SpO2 12/07/18 1724 92 %     Weight 12/07/18 1726 206 lb (93.4 kg)     Height 12/07/18 1726 _0  (1.626 m)     Head Circumference --      Peak Flow --      Pain Score 12/07/18 1726 0     Pain Loc --      Pain Edu? --      Excl. in [place]? --     Constitutional: Alert and oriented. Well appearing and in no acute distress. Eyes: Conjunctivae are normal. Head: Atraumatic. Nose: No congestion/rhinnorhea. Mouth/Throat: Mucous membranes are moist. Neck: No stridor.  Cardiovascular: Normal rate, regular rhythm. Grossly normal heart sounds.  Good peripheral circulation. Respiratory: Normal respiratory effort.  No retractions.  Slightly coarse to auscultation with mild end expiratory wheezing.  No focal rales.  She speaks in phrases.  Patient oxygen saturation 92% on 3 L, but when reduce her to her normal 2 L her oxygen saturation goes to 89% on room air. Gastrointestinal: Soft and nontender. No distention. Musculoskeletal: No lower extremity tenderness nor edema. Neurologic:  Normal speech and language. No gross focal neurologic deficits are appreciated.  Skin:  Skin is warm, dry and intact. No rash noted. Psychiatric: Mood and affect are normal. Speech and behavior are normal.  ____________________________________________   LABS (all labs ordered are listed, but only abnormal results are displayed)  Labs Reviewed  CBC - Abnormal; Notable for the following components:      Result Value   WBC 16.5  (*)    All other components within normal limits  BASIC METABOLIC PANEL - Abnormal; Notable for the following components:   Glucose, Bld 148 (*)    All other components within normal limits  BRAIN NATRIURETIC PEPTIDE - Abnormal; Notable for the following components:   B Natriuretic Peptide 253.0 (*)    All other components within normal limits  INFLUENZA PANEL BY PCR (TYPE A & B)   ____________________________________________  EKG  Reviewed enterotomy at 1720 Heart rate 90 QRS 80 QTc 460 Normal sinus rhythm with occasional  PVCs.  No evidence of acute ischemia ____________________________________________  RADIOLOGY  Dg Chest 2 View  Result Date: 12/07/2018 CLINICAL DATA:  Shortness of breath EXAM: CHEST - 2 VIEW COMPARISON:  11/06/2018, CT 11/08/2018, radiograph 12/19/2017 FINDINGS: Mild cardiomegaly and slight central congestion. Trace pleural effusions. Aortic atherosclerosis. No pneumothorax. IMPRESSION: 1. Cardiomegaly with central congestion and trace pleural effusions. No focal airspace disease. Electronically Signed   By: Donavan Foil M.D.   On: 12/07/2018 18:22    ____________________________________________   PROCEDURES  Procedure(s) performed: None  Procedures  Critical Care performed: No  ____________________________________________   INITIAL IMPRESSION / ASSESSMENT AND PLAN / ED COURSE  Pertinent labs & imaging results that were available during my care of the patient were reviewed by me and considered in my medical decision making (see chart for details).   Patient presents, clinical symptoms appear most consistent with COPD exacerbation.  Does not have evidence of CHF by clinical examination her BNP improved from previous.  Does not appear volume overloaded, appears at her baseline weight.  Denies any chest pain, signs and symptoms do not seem to suggest risk for pulmonary embolism.  Does not appear to be acute cardiac  Given nebs with some improvement,  but with ambulation still drops her oxygen saturation into the mid 80s with ambulation, increased her oxygen to 3 L and will give additional nebs.  She is resting quite comfortably at this time.  Discussed with patient and family they are in agreement with plan for admission, further pulmonary care and treatment.  Dr. Jannifer Franklin to evaluate from the hospitalist service.      ____________________________________________   FINAL CLINICAL IMPRESSION(S) / ED DIAGNOSES  Final diagnoses:  COPD exacerbation ([place])        Note:  This document was prepared using Dragon voice recognition software and may include unintentional dictation errors       Delman Kitten, MD 12/07/18 2219

## 2018-12-08 ENCOUNTER — Other Ambulatory Visit: Payer: Self-pay

## 2018-12-08 LAB — BASIC METABOLIC PANEL
Anion gap: 9 (ref 5–15)
BUN: 21 mg/dL (ref 8–23)
CO2: 26 mmol/L (ref 22–32)
Calcium: 9.1 mg/dL (ref 8.9–10.3)
Chloride: 104 mmol/L (ref 98–111)
Creatinine, Ser: 0.89 mg/dL (ref 0.44–1.00)
GFR calc Af Amer: >60 mL/min (ref >60)
GFR calc non Af Amer: >60 mL/min (ref >60)
Glucose, Bld: 234 mg/dL — ABNORMAL HIGH (ref 70–99)
Potassium: 4.2 mmol/L (ref 3.5–5.1)
SODIUM: 139 mmol/L (ref 135–145)

## 2018-12-08 LAB — CBC
HEMATOCRIT: 40.9 % (ref 36.0–46.0)
Hemoglobin: 12.7 g/dL (ref 12.0–15.0)
MCH: 28.7 pg (ref 26.0–34.0)
MCHC: 31.1 g/dL (ref 30.0–36.0)
MCV: 92.5 fL (ref 80.0–100.0)
Platelets: 220 10*3/uL (ref 150–400)
RBC: 4.42 MIL/uL (ref 3.87–5.11)
RDW: 13.2 % (ref 11.5–15.5)
WBC: 14.1 10*3/uL — ABNORMAL HIGH (ref 4.0–10.5)
nRBC: 0 % (ref 0.0–0.2)

## 2018-12-08 LAB — GLUCOSE, CAPILLARY
Glucose-Capillary: 152 mg/dL — ABNORMAL HIGH (ref 70–99)
Glucose-Capillary: 158 mg/dL — ABNORMAL HIGH (ref 70–99)
Glucose-Capillary: 194 mg/dL — ABNORMAL HIGH (ref 70–99)
Glucose-Capillary: 195 mg/dL — ABNORMAL HIGH (ref 70–99)

## 2018-12-08 MED ORDER — METOPROLOL TARTRATE 5 MG/5ML IV SOLN: 5.0000 mg | INTRAVENOUS | Status: DC | PRN

## 2018-12-08 MED ORDER — GUAIFENESIN-DM 100-10 MG/5ML PO SYRP
5.0000 mL | ORAL_SOLUTION | ORAL | Status: DC | PRN
  Administered 2018-12-08 (×2): 5 mL via ORAL
  Filled 2018-12-08 (×3): qty 5

## 2018-12-08 MED ORDER — ALBUTEROL SULFATE (2.5 MG/3ML) 0.083% IN NEBU: 2.5000 mg | INHALATION_SOLUTION | RESPIRATORY_TRACT | Status: DC | PRN

## 2018-12-08 MED ORDER — METHYLPREDNISOLONE SODIUM SUCC 40 MG IJ SOLR
40.0000 mg | Freq: Four times a day (QID) | INTRAMUSCULAR | Status: DC
  Administered 2018-12-08 – 2018-12-11 (×13): 40 mg via INTRAVENOUS
  Filled 2018-12-08 (×13): qty 1

## 2018-12-08 MED ORDER — IPRATROPIUM-ALBUTEROL 0.5-2.5 (3) MG/3ML IN SOLN
3.0000 mL | Freq: Four times a day (QID) | RESPIRATORY_TRACT | Status: DC
  Administered 2018-12-08 – 2018-12-11 (×12): 3 mL via RESPIRATORY_TRACT
  Filled 2018-12-08 (×12): qty 3

## 2018-12-08 MED ORDER — POLYETHYLENE GLYCOL 3350 17 G PO PACK
17.0000 g | PACK | Freq: Every day | ORAL | Status: DC
  Administered 2018-12-08 – 2018-12-11 (×4): 17 g via ORAL
  Filled 2018-12-08 (×4): qty 1

## 2018-12-08 MED ORDER — NICOTINE 14 MG/24HR TD PT24
14.0000 mg | MEDICATED_PATCH | Freq: Every day | TRANSDERMAL | Status: DC
  Administered 2018-12-08 – 2018-12-11 (×4): 14 mg via TRANSDERMAL
  Filled 2018-12-08 (×4): qty 1

## 2018-12-08 MED ORDER — FUROSEMIDE 10 MG/ML IJ SOLN
20.0000 mg | Freq: Every day | INTRAMUSCULAR | Status: DC
  Administered 2018-12-08 – 2018-12-11 (×4): 20 mg via INTRAVENOUS
  Filled 2018-12-08 (×4): qty 2

## 2018-12-08 MED ORDER — FUROSEMIDE 20 MG PO TABS: 20.0000 mg | ORAL_TABLET | Freq: Every day | ORAL | Status: DC | PRN

## 2018-12-08 MED ORDER — AZITHROMYCIN 500 MG PO TABS
500.0000 mg | ORAL_TABLET | Freq: Every day | ORAL | Status: DC
  Administered 2018-12-08 – 2018-12-11 (×4): 500 mg via ORAL
  Filled 2018-12-08 (×4): qty 1

## 2018-12-08 MED ORDER — INSULIN ASPART 100 UNIT/ML ~~LOC~~ SOLN
0.0000 [IU] | Freq: Three times a day (TID) | SUBCUTANEOUS | Status: DC
  Administered 2018-12-08 (×3): 2 [IU] via SUBCUTANEOUS
  Administered 2018-12-09: 3 [IU] via SUBCUTANEOUS
  Administered 2018-12-09: 18:00:00 1 [IU] via SUBCUTANEOUS
  Administered 2018-12-09: 09:00:00 2 [IU] via SUBCUTANEOUS
  Administered 2018-12-10: 3 [IU] via SUBCUTANEOUS
  Administered 2018-12-10 (×2): 2 [IU] via SUBCUTANEOUS
  Administered 2018-12-11: 3 [IU] via SUBCUTANEOUS
  Administered 2018-12-11: 2 [IU] via SUBCUTANEOUS
  Filled 2018-12-08 (×11): qty 1

## 2018-12-08 MED ORDER — CLONIDINE HCL 0.1 MG PO TABS
0.1000 mg | ORAL_TABLET | Freq: Two times a day (BID) | ORAL | Status: DC
  Administered 2018-12-08 – 2018-12-11 (×5): 0.1 mg via ORAL
  Filled 2018-12-08 (×6): qty 1

## 2018-12-08 NOTE — Care Management Note (Signed)
Case Management Note  Patient Details  Name: Megan Bolton MRN: [id number] Date of Birth: 09-26-1941  Subjective/Objective:  Admitted to [facility] with the diagnosis of COPD. Lives with daughter, Annell Greening. Sees Dr. Harrel Lemon as primary care physician. Prescriptions are filled at [facility], Currently receiving services from [facility]. No skilled facility. Home oxygen per [employer] x 2.5 years. Uses 2 liters continuously.  Rolling walker, CPAP  and cane in the home.  Takes care of all basic activities of daily living herself, doesn't drive. Daughter helps with errands.   No falls. Good appetite.                 Action/Plan: Will continue to follow for transition of care.    Expected Discharge Date:                  Expected Discharge Plan:     In-House Referral:   yes  Discharge planning Services   yes  Post Acute Care Choice:    Choice offered to:     DME Arranged:    DME Agency:     HH Arranged:    HH Agency:     Status of Service:     If discussed at [employer] of Stay Meetings, dates discussed:    Additional Comments:  Shelbie Ammons, RN MSN CCM Care Management [phone] 12/08/2018, 2:01 PM

## 2018-12-08 NOTE — Progress Notes (Addendum)
Family Meeting Note  Advance Directive:yes  Today a meeting took place with the Patient, daughter at bedside    The following clinical team members were present during this meeting:MD  The following were discussed:Patient's diagnosis: Acute COPD exacerbation, tobacco abuse disorder, chronic diastolic congestive heart failure, diabetes mellitus, hyperlipidemia, hypertension, treatment plan of care discussed in detail with the patient and her daughter bedside, they verbalized understanding of the plan   patient's progosis: Unable to determine and Goals for treatment: Full Code  Daughter Megan Bolton is  the healthcare POA  Additional follow-up to be provided: Hospitalist  Time spent during discussion:17 min  Nicholes Mango, MD

## 2018-12-08 NOTE — Progress Notes (Signed)
Pt stated that she does not want to wear cpap tonight. Pt understands that the hospital has them available if she changes her mind.

## 2018-12-08 NOTE — Progress Notes (Signed)
[place] at [place] NAME: Megan Bolton    MR#:  [id number]  DATE OF BIRTH:  1941-01-31  SUBJECTIVE:  CHIEF COMPLAINT: Patient is short of breath and wheezing diffusely during my examination.  Has intermittent episodes of productive cough.  Daughter at bedside  REVIEW OF SYSTEMS:  CONSTITUTIONAL: No fever, fatigue or weakness.  EYES: No blurred or double vision.  EARS, NOSE, AND THROAT: No tinnitus or ear pain.  RESPIRATORY: Reporting cough, exertional shortness of breath, wheezing, denies hemoptysis.  CARDIOVASCULAR: No chest pain, orthopnea, edema.  GASTROINTESTINAL: No nausea, vomiting, diarrhea or abdominal pain.  GENITOURINARY: No dysuria, hematuria.  ENDOCRINE: No polyuria, nocturia,  HEMATOLOGY: No anemia, easy bruising or bleeding SKIN: No rash or lesion. MUSCULOSKELETAL: No joint pain or arthritis.   NEUROLOGIC: No tingling, numbness, weakness.  PSYCHIATRY: No anxiety or depression.   DRUG ALLERGIES:   Allergies  Allergen Reactions  . Nitrofurantoin Anaphylaxis    VITALS:  Blood pressure (!) 194/94, pulse 99, temperature 98.1 F (36.7 C), resp. rate 18, height 5\' 4"  (1.626 m), weight 94.9 kg, SpO2 95 %.  PHYSICAL EXAMINATION:  GENERAL:  77 y.o.-year-old patient lying in the bed with no acute distress.  EYES: Pupils equal, round, reactive to light and accommodation. No scleral icterus. Extraocular muscles intact.  HEENT: Head atraumatic, normocephalic. Oropharynx and nasopharynx clear.  NECK:  Supple, no jugular venous distention. No thyroid enlargement, no tenderness.  LUNGS: Diminished breath sounds bilaterally, diffuse bilateral wheezing, no rales,rhonchi or crepitation. No use of accessory muscles of respiration.  CARDIOVASCULAR: S1, S2 normal. No murmurs, rubs, or gallops.  ABDOMEN: Soft, nontender, nondistended. Bowel sounds present. EXTREMITIES: No pedal edema, cyanosis, or clubbing.  NEUROLOGIC: Sensation  intact. Gait not checked.  PSYCHIATRIC: The patient is alert and oriented x 3.  SKIN: No obvious rash, lesion, or ulcer.    LABORATORY PANEL:   CBC Recent Labs  Lab 12/08/18 0310  WBC 14.1*  HGB 12.7  HCT 40.9  PLT 220   ------------------------------------------------------------------------------------------------------------------  Chemistries  Recent Labs  Lab 12/08/18 0310  NA 139  K 4.2  CL 104  CO2 26  GLUCOSE 234*  BUN 21  CREATININE 0.89  CALCIUM 9.1   ------------------------------------------------------------------------------------------------------------------  Cardiac Enzymes No results for input(s): TROPONINI in the last 168 hours. ------------------------------------------------------------------------------------------------------------------  RADIOLOGY:  Dg Chest 2 View  Result Date: 12/07/2018 CLINICAL DATA:  Shortness of breath EXAM: CHEST - 2 VIEW COMPARISON:  11/06/2018, CT 11/08/2018, radiograph 12/19/2017 FINDINGS: Mild cardiomegaly and slight central congestion. Trace pleural effusions. Aortic atherosclerosis. No pneumothorax. IMPRESSION: 1. Cardiomegaly with central congestion and trace pleural effusions. No focal airspace disease. Electronically Signed   By: Donavan Foil M.D.   On: 12/07/2018 18:22    EKG:   Orders placed or performed during the hospital encounter of 12/07/18  . EKG 12-Lead  . EKG 12-Lead  . ED EKG  . ED EKG    ASSESSMENT AND PLAN:    #Acute on chronic hypoxic respiratory failure secondary to COPD exacerbation Wean off oxygen as tolerated Solu-Medrol IV every 6 hours DuoNebs every 6 hours and albuterol as needed Continue azithromycin Chest x-ray no infiltrate but with pleural effusions  #Acute on chronic diastolic CHF exacerbation Lasix 20 mg IV daily Monitor intake and output and daily weights Recent echocardiogram in May 2019 has revealed 60 to 65% ejection fraction  #Obstructive sleep apnea CPAP  nightly  #Diabetes- Monitor sugars closely while she is on steroids and sliding  scale coverage  #Essential hypertension-uncontrolled blood pressure Continue Avapro, Coreg and clonidine  #Tobacco abuse disorder Counseled patient to quit smoking for 5 minutes.  She verbalized understanding .  Plan is to start her on nicotine patch.  Patient is agreeable  All the records are reviewed and case discussed with Care Management/Social Workerr. Management plans discussed with the patient, daughter at bedside and they are in agreement.  CODE STATUS: fc   TOTAL TIME TAKING CARE OF THIS PATIENT: 36  minutes.   POSSIBLE D/C IN 2 DAYS, DEPENDING ON CLINICAL CONDITION.  Note: This dictation was prepared with Dragon dictation along with smaller phrase technology. Any transcriptional errors that result from this process are unintentional.   Nicholes Mango M.D on 12/08/2018 at 12:47 PM  Between 7am to 6pm - Pager - [phone] After 6pm go to [email or web address] - password [email or web address] [facility]  Office  [phone]  CC: Primary care physician; Baxter Hire, MD

## 2018-12-08 NOTE — Progress Notes (Signed)
Pt refused cpap

## 2018-12-09 LAB — BASIC METABOLIC PANEL
Anion gap: 7 (ref 5–15)
BUN: 26 mg/dL — AB (ref 8–23)
CO2: 28 mmol/L (ref 22–32)
Calcium: 8.9 mg/dL (ref 8.9–10.3)
Chloride: 102 mmol/L (ref 98–111)
Creatinine, Ser: 1.05 mg/dL — ABNORMAL HIGH (ref 0.44–1.00)
GFR calc Af Amer: 59 mL/min — ABNORMAL LOW (ref >60)
GFR calc non Af Amer: 51 mL/min — ABNORMAL LOW (ref >60)
Glucose, Bld: 198 mg/dL — ABNORMAL HIGH (ref 70–99)
Potassium: 4.4 mmol/L (ref 3.5–5.1)
Sodium: 137 mmol/L (ref 135–145)

## 2018-12-09 LAB — CBC
HCT: 40.1 % (ref 36.0–46.0)
Hemoglobin: 12.4 g/dL (ref 12.0–15.0)
MCH: 28.8 pg (ref 26.0–34.0)
MCHC: 30.9 g/dL (ref 30.0–36.0)
MCV: 93 fL (ref 80.0–100.0)
Platelets: 196 10*3/uL (ref 150–400)
RBC: 4.31 MIL/uL (ref 3.87–5.11)
RDW: 13.2 % (ref 11.5–15.5)
WBC: 11.6 10*3/uL — ABNORMAL HIGH (ref 4.0–10.5)
nRBC: 0 % (ref 0.0–0.2)

## 2018-12-09 LAB — GLUCOSE, CAPILLARY
Glucose-Capillary: 186 mg/dL — ABNORMAL HIGH (ref 70–99)
Glucose-Capillary: 227 mg/dL — ABNORMAL HIGH (ref 70–99)
Glucose-Capillary: 155 mg/dL — ABNORMAL HIGH (ref 70–99)
Glucose-Capillary: 147 mg/dL — ABNORMAL HIGH (ref 70–99)
Glucose-Capillary: 243 mg/dL — ABNORMAL HIGH (ref 70–99)

## 2018-12-09 MED ORDER — GUAIFENESIN ER 600 MG PO TB12
600.0000 mg | ORAL_TABLET | Freq: Two times a day (BID) | ORAL | Status: DC
  Administered 2018-12-09 – 2018-12-11 (×5): 600 mg via ORAL
  Filled 2018-12-09 (×5): qty 1

## 2018-12-09 MED ORDER — GABAPENTIN 300 MG PO CAPS
800.0000 mg | ORAL_CAPSULE | Freq: Two times a day (BID) | ORAL | Status: DC
  Administered 2018-12-09 – 2018-12-11 (×4): 800 mg via ORAL
  Filled 2018-12-09 (×4): qty 2

## 2018-12-09 NOTE — Progress Notes (Signed)
[place] at [place] NAME: Megan Bolton    MR#:  [id number]  DATE OF BIRTH:  1941-10-25  SUBJECTIVE:  CHIEF COMPLAINT: Patient is short of breath and wheezing better but coughing with min exertion    REVIEW OF SYSTEMS:  CONSTITUTIONAL: No fever, fatigue or weakness.  EYES: No blurred or double vision.  EARS, NOSE, AND THROAT: No tinnitus or ear pain.  RESPIRATORY: Reporting cough, exertional shortness of breath, wheezing, denies hemoptysis.  CARDIOVASCULAR: No chest pain, orthopnea, edema.  GASTROINTESTINAL: No nausea, vomiting, diarrhea or abdominal pain.  GENITOURINARY: No dysuria, hematuria.  ENDOCRINE: No polyuria, nocturia,  HEMATOLOGY: No anemia, easy bruising or bleeding SKIN: No rash or lesion. MUSCULOSKELETAL: No joint pain or arthritis.   NEUROLOGIC: No tingling, numbness, weakness.  PSYCHIATRY: No anxiety or depression.   DRUG ALLERGIES:   Allergies  Allergen Reactions  . Nitrofurantoin Anaphylaxis    VITALS:  Blood pressure 115/68, pulse 82, temperature 98 F (36.7 C), temperature source Oral, resp. rate 19, height 5\' 4"  (1.626 m), weight 94.9 kg, SpO2 98 %.  PHYSICAL EXAMINATION:  GENERAL:  77 y.o.-year-old patient lying in the bed with no acute distress.  EYES: Pupils equal, round, reactive to light and accommodation. No scleral icterus. Extraocular muscles intact.  HEENT: Head atraumatic, normocephalic. Oropharynx and nasopharynx clear.  NECK:  Supple, no jugular venous distention. No thyroid enlargement, no tenderness.  LUNGS: mod breath sounds bilaterally, diffuse bilateral wheezing, no rales,rhonchi or crepitation. No use of accessory muscles of respiration.  CARDIOVASCULAR: S1, S2 normal. No murmurs, rubs, or gallops.  ABDOMEN: Soft, nontender, nondistended. Bowel sounds present. EXTREMITIES: No pedal edema, cyanosis, or clubbing.  NEUROLOGIC: Sensation intact. Gait not checked.  PSYCHIATRIC: The  patient is alert and oriented x 3.  SKIN: No obvious rash, lesion, or ulcer.    LABORATORY PANEL:   CBC Recent Labs  Lab 12/09/18 0305  WBC 11.6*  HGB 12.4  HCT 40.1  PLT 196   ------------------------------------------------------------------------------------------------------------------  Chemistries  Recent Labs  Lab 12/09/18 0305  NA 137  K 4.4  CL 102  CO2 28  GLUCOSE 198*  BUN 26*  CREATININE 1.05*  CALCIUM 8.9   ------------------------------------------------------------------------------------------------------------------  Cardiac Enzymes No results for input(s): TROPONINI in the last 168 hours. ------------------------------------------------------------------------------------------------------------------  RADIOLOGY:  Dg Chest 2 View  Result Date: 12/07/2018 CLINICAL DATA:  Shortness of breath EXAM: CHEST - 2 VIEW COMPARISON:  11/06/2018, CT 11/08/2018, radiograph 12/19/2017 FINDINGS: Mild cardiomegaly and slight central congestion. Trace pleural effusions. Aortic atherosclerosis. No pneumothorax. IMPRESSION: 1. Cardiomegaly with central congestion and trace pleural effusions. No focal airspace disease. Electronically Signed   By: Donavan Foil M.D.   On: 12/07/2018 18:22    EKG:   Orders placed or performed during the hospital encounter of 12/07/18  . EKG 12-Lead  . EKG 12-Lead  . ED EKG  . ED EKG  . EKG    ASSESSMENT AND PLAN:    #Acute on chronic hypoxic respiratory failure secondary to COPD exacerbation Wean off oxygen as tolerated Solu-Medrol IV every 12 hours DuoNebs every 6 hours and albuterol as needed Continue azithromycin Chest x-ray no infiltrate but with pleural effusions  #Acute on chronic diastolic CHF exacerbation Lasix 20 mg IV daily Monitor intake and output and daily weights Recent echocardiogram in May 2019 has revealed 60 to 65% ejection fraction  #Obstructive sleep apnea CPAP nightly  #Diabetes- Monitor sugars  closely while she is on steroids and sliding scale coverage  #  Essential hypertension-uncontrolled blood pressure Continue Avapro, Coreg and clonidine  #Tobacco abuse disorder Counseled patient to quit smoking for 5 minutes.  She verbalized understanding .  Plan is to start her on nicotine patch.  Patient is agreeable  All the records are reviewed and case discussed with Care Management/Social Workerr. Management plans discussed with the patient, daughter at bedside and they are in agreement.  CODE STATUS: fc   TOTAL TIME TAKING CARE OF THIS PATIENT: 36  minutes.   POSSIBLE D/C IN 1-2 DAYS, DEPENDING ON CLINICAL CONDITION.  Note: This dictation was prepared with Dragon dictation along with smaller phrase technology. Any transcriptional errors that result from this process are unintentional.   Nicholes Mango M.D on 12/09/2018 at 5:48 PM  Between 7am to 6pm - Pager - [phone] After 6pm go to [email or web address] - password [email or web address] [facility]  Office  [phone]  CC: Primary care physician; Baxter Hire, MD

## 2018-12-09 NOTE — Care Management Important Message (Signed)
Important Message  Patient Details  Name: Megan Bolton MRN: [id number] Date of Birth: August 27, 1941   Medicare Important Message Given:       Shelbie Ammons, RN 12/09/2018, 1:05 PM

## 2018-12-10 LAB — GLUCOSE, CAPILLARY
Glucose-Capillary: 180 mg/dL — ABNORMAL HIGH (ref 70–99)
Glucose-Capillary: 216 mg/dL — ABNORMAL HIGH (ref 70–99)
Glucose-Capillary: 168 mg/dL — ABNORMAL HIGH (ref 70–99)
Glucose-Capillary: 235 mg/dL — ABNORMAL HIGH (ref 70–99)

## 2018-12-10 MED ORDER — ZOLPIDEM TARTRATE 5 MG PO TABS: 5.0000 mg | ORAL_TABLET | Freq: Every evening | ORAL | Status: DC | PRN

## 2018-12-10 NOTE — Plan of Care (Signed)

## 2018-12-10 NOTE — Progress Notes (Signed)
[place] at [place] NAME: Megan Bolton    MR#:  [id number]  DATE OF BIRTH:  Jan 23, 1941  SUBJECTIVE:  CHIEF COMPLAINT: Patient continues to be weak  REVIEW OF SYSTEMS:  CONSTITUTIONAL: No fever, fatigue or positive weakness.  EYES: No blurred or double vision.  EARS, NOSE, AND THROAT: No tinnitus or ear pain.  RESPIRATORY: Reporting cough, exertional shortness of breath, wheezing, denies hemoptysis.  CARDIOVASCULAR: No chest pain, orthopnea, edema.  GASTROINTESTINAL: No nausea, vomiting, diarrhea or abdominal pain.  GENITOURINARY: No dysuria, hematuria.  ENDOCRINE: No polyuria, nocturia,  HEMATOLOGY: No anemia, easy bruising or bleeding SKIN: No rash or lesion. MUSCULOSKELETAL: No joint pain or arthritis.   NEUROLOGIC: No tingling, numbness, weakness.  PSYCHIATRY: No anxiety or depression.   DRUG ALLERGIES:   Allergies  Allergen Reactions  . Nitrofurantoin Anaphylaxis    VITALS:  Blood pressure (!) 119/59, pulse 72, temperature 98.3 F (36.8 C), temperature source Oral, resp. rate 20, height 5\' 4"  (1.626 m), weight 94.9 kg, SpO2 95 %.  PHYSICAL EXAMINATION:  GENERAL:  77 y.o.-year-old patient lying in the bed with no acute distress.  EYES: Pupils equal, round, reactive to light and accommodation. No scleral icterus. Extraocular muscles intact.  HEENT: Head atraumatic, normocephalic. Oropharynx and nasopharynx clear.  NECK:  Supple, no jugular venous distention. No thyroid enlargement, no tenderness.  LUNGS: mod breath sounds bilaterally, diffuse bilateral wheezing, no rales,rhonchi or crepitation. No use of accessory muscles of respiration.  CARDIOVASCULAR: S1, S2 normal. No murmurs, rubs, or gallops.  ABDOMEN: Soft, nontender, nondistended. Bowel sounds present. EXTREMITIES: No pedal edema, cyanosis, or clubbing.  NEUROLOGIC: Sensation intact. Gait not checked.  PSYCHIATRIC: The patient is alert and oriented x 3.   SKIN: No obvious rash, lesion, or ulcer.    LABORATORY PANEL:   CBC Recent Labs  Lab 12/09/18 0305  WBC 11.6*  HGB 12.4  HCT 40.1  PLT 196   ------------------------------------------------------------------------------------------------------------------  Chemistries  Recent Labs  Lab 12/09/18 0305  NA 137  K 4.4  CL 102  CO2 28  GLUCOSE 198*  BUN 26*  CREATININE 1.05*  CALCIUM 8.9   ------------------------------------------------------------------------------------------------------------------  Cardiac Enzymes No results for input(s): TROPONINI in the last 168 hours. ------------------------------------------------------------------------------------------------------------------  RADIOLOGY:  No results found.  EKG:   Orders placed or performed during the hospital encounter of 12/07/18  . EKG 12-Lead  . EKG 12-Lead  . ED EKG  . ED EKG  . EKG    ASSESSMENT AND PLAN:    #Acute on chronic hypoxic respiratory failure secondary to COPD exacerbation Chronic oxygen  Solu-Medrol IV every 12 hours  continue DuoNebs every 6 hours and albuterol as needed Continue azithromycin Chest x-ray no infiltrate but with pleural effusions  #Acute on chronic diastolic CHF exacerbation Lasix 20 mg IV daily Monitor intake and output and daily weights Recent echocardiogram in May 2019 has revealed 60 to 65% ejection fraction  #Obstructive sleep apnea CPAP nightly  #Diabetes- Monitor sugars closely while she is on steroids and sliding scale coverage  #Essential hypertension-uncontrolled blood pressure Continue Avapro, Coreg and clonidine  #Tobacco abuse disorder Patient recommend to quit smoking for 5 minutes.  She verbalized understanding .  Plan is to start her on nicotine patch.  Patient is agreeable  All the records are reviewed and case discussed with Care Management/Social Workerr. Management plans discussed with the patient, daughter at bedside and they  are in agreement.  CODE STATUS: fc   TOTAL TIME  TAKING CARE OF THIS PATIENT: 36  minutes.   POSSIBLE D/C IN 1-2 DAYS, DEPENDING ON CLINICAL CONDITION.  Note: This dictation was prepared with Dragon dictation along with smaller phrase technology. Any transcriptional errors that result from this process are unintentional.   Dustin Flock M.D on 12/10/2018 at 12:44 PM  Between 7am to 6pm - Pager - [phone] After 6pm go to [email or web address] - password [email or web address] [facility]  Office  [phone]  CC: Primary care physician; Baxter Hire, MD

## 2018-12-11 LAB — GLUCOSE, CAPILLARY
Glucose-Capillary: 175 mg/dL — ABNORMAL HIGH (ref 70–99)
Glucose-Capillary: 219 mg/dL — ABNORMAL HIGH (ref 70–99)
Glucose-Capillary: 207 mg/dL — ABNORMAL HIGH (ref 70–99)

## 2018-12-11 MED ORDER — GUAIFENESIN ER 600 MG PO TB12: 600.0000 mg | ORAL_TABLET | Freq: Two times a day (BID) | ORAL | 0 refills | Status: AC

## 2018-12-11 MED ORDER — IPRATROPIUM-ALBUTEROL 0.5-2.5 (3) MG/3ML IN SOLN: 3.0000 mL | Freq: Four times a day (QID) | RESPIRATORY_TRACT | 0 refills | Status: AC | PRN

## 2018-12-11 MED ORDER — PREDNISONE 10 MG (21) PO TBPK: ORAL_TABLET | ORAL | 0 refills | Status: AC

## 2018-12-11 MED ORDER — GUAIFENESIN-DM 100-10 MG/5ML PO SYRP: 5.0000 mL | ORAL_SOLUTION | ORAL | 0 refills | Status: AC | PRN

## 2018-12-11 MED ORDER — AZITHROMYCIN 500 MG PO TABS: 500.0000 mg | ORAL_TABLET | Freq: Every day | ORAL | 0 refills | Status: AC

## 2018-12-11 MED ORDER — NICOTINE 14 MG/24HR TD PT24: 14.0000 mg | MEDICATED_PATCH | Freq: Every day | TRANSDERMAL | 0 refills | Status: AC

## 2018-12-11 NOTE — Plan of Care (Signed)

## 2018-12-11 NOTE — Evaluation (Signed)
Physical Therapy Evaluation Patient Details Name: Megan Bolton MRN: [id number] DOB: 1941/08/21 Today's Date: 12/11/2018   History of Present Illness  Megan Bolton is a 77yo female PMH: CHF, COPD, diabetes, hypertension, hyperlipidemia,  comes to [facility] on 12/11 for chest tightness and some shortness of breath. Pt admitted with COPD exacerbation.   Clinical Impression  Pt admitted with above diagnosis. Pt currently with functional limitations due to the deficits listed below (see "PT Problem List"). Upon entry, pt seated at [facility], attending physician in room finishing up. The pt is awake and agreeable to participate. The pt is alert and oriented x3, pleasant, conversational. Pt performs all mobility modified independent, but AMB trial is supervised to determine O2 needs for gait. Pt saturated well on 2L (baseline flow rate) seated, but educate don importance of increase with activity, as she requires 4L/min for household distances this session.  Functional mobility assessment demonstrates increased effort/time requirements, fair tolerance, but no need for physical assistance, whereas the patient performed these at a slightly higher level of independence PTA. Pt reports feeling "somewhat" more weak than her baseline. Pt will benefit from skilled PT intervention to increase independence and safety with basic mobility in preparation for discharge to the venue listed below.       Follow Up Recommendations Home health PT    Equipment Recommendations  None recommended by PT    Recommendations for Other Services       Precautions / Restrictions Precautions Precautions: None      Mobility  Bed Mobility               General bed mobility comments: received sitting EOB   Transfers Overall transfer level: Modified independent Equipment used: Rolling walker (2 wheeled)             General transfer comment: safe, well established motor patterns  Ambulation/Gait Ambulation/Gait  assistance: Supervision(PT assisting c O2 line) Gait Distance (Feet): 140 Feet(performed 2x; on 2L, then 4L ) Assistive device: Rolling walker (2 wheeled) Gait Pattern/deviations: WFL(Within Functional Limits)     General Gait Details: drops to 85% on 2L, 92% on 4L   Stairs            Wheelchair Mobility    Modified Rankin (Stroke Patients Only)       Balance Overall balance assessment: Modified Independent                                           Pertinent Vitals/Pain Pain Assessment: No/denies pain    Home Living Family/patient expects to be discharged to:: Private residence Living Arrangements: Children(DTR/SIL) Available Help at Discharge: Family Type of Home: House Home Access: Stairs to enter Entrance Stairs-Rails: Right;Left;Can reach both Entrance Stairs-Number of Steps: 2 Home Layout: One level Home Equipment: [place] - 4 wheels;Shower seat;Grab bars - tub/shower;Walker - 2 wheels Additional Comments: O2 at home 2L/min     Prior Function Level of Independence: Independent with assistive device(s)         Comments: independent in ADLs, primarily uses rollator in home, very limited community ambulator (MD appts), family performs IADLs.     Hand Dominance        Extremity/Trunk Assessment   Upper Extremity Assessment Upper Extremity Assessment: Generalized weakness;Overall [facility] for tasks assessed    Lower Extremity Assessment Lower Extremity Assessment: Generalized weakness;Overall [facility] for tasks assessed  Communication      Cognition Arousal/Alertness: Awake/alert Behavior During Therapy: WFL for tasks assessed/performed Overall Cognitive Status: Within Functional Limits for tasks assessed                                        General Comments      Exercises     Assessment/Plan    PT Assessment Patient needs continued PT services  PT Problem List Decreased activity tolerance;Decreased  mobility       PT Treatment Interventions Therapeutic exercise;Functional mobility training;Therapeutic activities;Patient/family education    PT Goals (Current goals can be found in the Care Plan section)  Acute Rehab PT Goals Patient Stated Goal: regain strength lost while admitted PT Goal Formulation: With patient Time For Goal Achievement: 12/25/18 Potential to Achieve Goals: Good    Frequency Min 2X/week   Barriers to discharge        Co-evaluation               AM-PAC PT "6 Clicks" Mobility  Outcome Measure Help needed turning from your back to your side while in a flat bed without using bedrails?: None Help needed moving from lying on your back to sitting on the side of a flat bed without using bedrails?: None Help needed moving to and from a bed to a chair (including a wheelchair)?: None Help needed standing up from a chair using your arms (e.g., wheelchair or bedside chair)?: None Help needed to walk in hospital room?: A Little Help needed climbing 3-5 steps with a railing? : A Little 6 Click Score: 22    End of Session Equipment Utilized During Treatment: Oxygen Activity Tolerance: Patient tolerated treatment well Patient left: with call bell/phone within reach(in bathroom on toilet) Nurse Communication: Mobility status PT Visit Diagnosis: Difficulty in walking, not elsewhere classified (R26.2)    Time: 7017-7939 PT Time Calculation (min) (ACUTE ONLY): 26 min   Charges:   PT Evaluation $PT Eval Low Complexity: 1 Low PT Treatments $Therapeutic Activity: 8-22 mins        12:59 PM, 12/11/18 Etta Grandchild, PT, DPT Physical Therapist - [facility]  [phone] ([place])     Buccola,Allan C 12/11/2018, 12:55 PM

## 2018-12-11 NOTE — Progress Notes (Signed)
Position/Activity  SpO2 (%)  Heartrate (BPM)  Flow Rate (L/min)   Seated EOB 95%  2L/min  s/p 2 minutes AMB 84%  2L/min  30 second recovery  89%  3L/min  s/p 2 minutes AMB  92%  4L/min    Vitals established during AMB with Physical Therapy evaluation. Pt reports somewhat similar to baseline. Education on need for more continuous monitoring at home and encouraged to increase flow rate as needed during sustained physical activity to maintain O2 sats >88%. Pt aqreeable. Left on 3L/min at end of session as pt is mobilizing to bathroom: RN made aware.    11:31 AM, 12/11/18 Etta Grandchild, PT, DPT Physical Therapist - [facility]  [phone] [facility])

## 2018-12-11 NOTE — Discharge Summary (Signed)
Megan Bolton at [facility], 77 y.o., DOB [birth year], MRN [id number]. Admission date: 12/07/2018 Discharge Date 12/11/2018 Primary MD Baxter Hire, MD Admitting Physician Lance Coon, MD  Admission Diagnosis  COPD exacerbation [facility] [J44.1]  Discharge Diagnosis   Principal Problem: Acute on chronic respiratory failure Acute on chronic COPD exacerbation Acute on chronic diastolic CHF exacerbation Obstructive sleep apnea Diabetes type 2 Essential hypertension Tobacco abuse    Hospital Course Megan Bolton  is a 76 y.o. female who presents with chief complaint as above.  Patient presents with 2 days progressive shortness of breath with wheezing.  Patient was noted to have both COPD exacerbation and acute CHF.  She was treated with nebulizer steroids and antibiotics as well as IV Lasix.  Her symptoms have now improved.  Breathing is improved.  She is on chronic 2 L of oxygen.  She was seen by PT her oxygen requirement does increase with activity therefore patient told to increase oxygen to 4 L with activity and 2 L with rest.           Consults  None  Significant Tests:  See full reports for all details     Dg Chest 2 View  Result Date: 12/07/2018 CLINICAL DATA:  Shortness of breath EXAM: CHEST - 2 VIEW COMPARISON:  11/06/2018, CT 11/08/2018, radiograph 12/19/2017 FINDINGS: Mild cardiomegaly and slight central congestion. Trace pleural effusions. Aortic atherosclerosis. No pneumothorax. IMPRESSION: 1. Cardiomegaly with central congestion and trace pleural effusions. No focal airspace disease. Electronically Signed   By: Donavan Foil M.D.   On: 12/07/2018 18:22       Today   Subjective:   Megan Bolton patient is breathing much better Objective:   Blood pressure (!) 157/90, pulse 81, temperature 98.1 F (36.7 C), temperature source Oral, resp. rate 16, height '5\' 4"'$  (1.626 m), weight 94.9 kg, SpO2 92 %.  . No intake or  output data in the 24 hours ending 12/11/18 1313  Exam VITAL SIGNS: Blood pressure (!) 157/90, pulse 81, temperature 98.1 F (36.7 C), temperature source Oral, resp. rate 16, height '5\' 4"'$  (1.626 m), weight 94.9 kg, SpO2 92 %.  GENERAL:  77 y.o.-year-old patient lying in the bed with no acute distress.  EYES: Pupils equal, round, reactive to light and accommodation. No scleral icterus. Extraocular muscles intact.  HEENT: Head atraumatic, normocephalic. Oropharynx and nasopharynx clear.  NECK:  Supple, no jugular venous distention. No thyroid enlargement, no tenderness.  LUNGS: Normal breath sounds bilaterally, no wheezing, rales,rhonchi or crepitation. No use of accessory muscles of respiration.  CARDIOVASCULAR: S1, S2 normal. No murmurs, rubs, or gallops.  ABDOMEN: Soft, nontender, nondistended. Bowel sounds present. No organomegaly or mass.  EXTREMITIES: No pedal edema, cyanosis, or clubbing.  NEUROLOGIC: Cranial nerves II through XII are intact. Muscle strength 5/5 in all extremities. Sensation intact. Gait not checked.  PSYCHIATRIC: The patient is alert and oriented x 3.  SKIN: No obvious rash, lesion, or ulcer.   Data Review     CBC w Diff:  Lab Results  Component Value Date   WBC 11.6 (H) 12/09/2018   HGB 12.4 12/09/2018   HGB 12.3 06/12/2015   HCT 40.1 12/09/2018   HCT 38.0 06/12/2015   PLT 196 12/09/2018   PLT 216 06/12/2015   LYMPHOPCT 19 06/03/2017   LYMPHOPCT 24.7 06/14/2014   MONOPCT 7 06/03/2017   MONOPCT 7.2 06/14/2014   EOSPCT 3 06/03/2017   EOSPCT 4.2 06/14/2014   BASOPCT 1  06/03/2017   BASOPCT 1.1 06/14/2014   CMP:  Lab Results  Component Value Date   NA 137 12/09/2018   NA 143 06/12/2015   NA 138 09/19/2014   K 4.4 12/09/2018   K 3.9 09/19/2014   CL 102 12/09/2018   CL 103 09/19/2014   CO2 28 12/09/2018   CO2 32 09/19/2014   BUN 26 (H) 12/09/2018   BUN 32 (H) 06/12/2015   BUN 25 (H) 09/19/2014   CREATININE 1.05 (H) 12/09/2018   CREATININE 1.06  (H) 01/18/2017   PROT 6.9 11/06/2018   PROT 6.5 06/12/2015   PROT 7.3 09/19/2014   ALBUMIN 3.9 11/06/2018   ALBUMIN 3.9 06/12/2015   ALBUMIN 3.3 (L) 09/19/2014   BILITOT 0.6 11/06/2018   BILITOT 0.3 06/12/2015   BILITOT 0.4 09/19/2014   ALKPHOS 60 11/06/2018   ALKPHOS 80 09/19/2014   AST 17 11/06/2018   AST 12 (L) 09/19/2014   ALT 16 11/06/2018   ALT 16 09/19/2014  .  Micro Results No results found for this or any previous visit (from the past 240 hour(s)).      Code Status Orders  (From admission, onward)         Start     Ordered   12/07/18 2337  Full code  Continuous     12/07/18 2336        Code Status History    Date Active Date Inactive Code Status Order ID Comments User Context   11/06/2018 2236 11/09/2018 1517 Full Code [id number]  Vaughan Basta, MD Inpatient   09/25/2018 2129 09/28/2018 1737 Full Code [id number]  Sela Hua, MD Inpatient   05/25/2018 0527 05/25/2018 1825 Full Code [id number]  Arta Silence, MD Inpatient   12/19/2017 0514 12/19/2017 1813 Full Code [id number]  Saundra Shelling, MD Inpatient   08/30/2017 1021 08/31/2017 1500 Full Code [id number]  Bettey Costa, MD Inpatient   04/18/2016 0142 04/19/2016 1732 Full Code [id number]  Lance Coon, MD ED   02/28/2016 0540 03/01/2016 1739 Full Code [id number]  Saundra Shelling, MD Inpatient    Advance Directive Documentation     Most Recent Value  Type of Advance Directive  Healthcare Power of Attorney, Living will  Pre-existing out of facility DNR order (yellow form or pink MOST form)  -  "MOST" Form in Place?  -          Follow-up Information    Baxter Hire, MD. Schedule an appointment as soon as possible for a visit in 6 days.   Specialty:  Internal Medicine Why:  Office Closed  Contact information: [place] [address] [phone]           Discharge Medications   Allergies as of 12/11/2018      Reactions   Nitrofurantoin Anaphylaxis       Medication List    STOP taking these medications   conjugated estrogens vaginal cream Commonly known as:  PREMARIN   pantoprazole 40 MG tablet Commonly known as:  PROTONIX     TAKE these medications   ACCU-CHEK AVIVA PLUS test strip Generic drug:  glucose blood 1 each by Other route 2 (two) times daily.   ACCU-CHEK AVIVA PLUS w/Device Kit USE AS DIRECTED   ACCU-CHEK SOFTCLIX LANCETS lancets 1 each by Other route 2 (two) times daily.   albuterol 108 (90 Base) MCG/ACT inhaler Commonly known as:  VENTOLIN HFA Inhale 2 puffs into the lungs every 6 (six) hours as needed for wheezing or shortness of  breath. What changed:  Another medication with the same name was removed. Continue taking this medication, and follow the directions you see here.   aspirin EC 81 MG tablet Take 81 mg by mouth daily.   azithromycin 500 MG tablet Commonly known as:  ZITHROMAX Take 1 tablet (500 mg total) by mouth daily for 2 days. Start taking on:  December 12, 2018   carvedilol 12.5 MG tablet Commonly known as:  COREG Take 1 tablet (12.5 mg total) by mouth 2 (two) times daily. What changed:  when to take this   clonazePAM 0.5 MG tablet Commonly known as:  KLONOPIN Take 0.5 mg by mouth at bedtime as needed.   cloNIDine 0.1 MG tablet Commonly known as:  CATAPRES 1 tablet daily as needed for systolic blood pressure (top number) greater than 295 or diastolic blood pressure (bottom number) greater than 105   Fluticasone-Salmeterol 250-50 MCG/DOSE Aepb Commonly known as:  ADVAIR DISKUS Inhale 1 puff into the lungs 2 (two) times daily.   furosemide 20 MG tablet Commonly known as:  LASIX Alternate one pill orally every other day, 2 pills po every onther day What changed:    how much to take  how to take this  when to take this  additional instructions   gabapentin 800 MG tablet Commonly known as:  NEURONTIN Take 1 tablet (800 mg total) by mouth 3 (three) times daily.   guaiFENesin  600 MG 12 hr tablet Commonly known as:  MUCINEX Take 1 tablet (600 mg total) by mouth 2 (two) times daily for 4 days.   guaiFENesin-dextromethorphan 100-10 MG/5ML syrup Commonly known as:  ROBITUSSIN DM Take 5 mLs by mouth every 4 (four) hours as needed for cough.   ipratropium-albuterol 0.5-2.5 (3) MG/3ML Soln Commonly known as:  DUONEB Take 3 mLs by nebulization every 6 (six) hours as needed.   irbesartan 75 MG tablet Commonly known as:  AVAPRO Take 1 tablet (75 mg total) by mouth daily.   metFORMIN 500 MG tablet Commonly known as:  GLUCOPHAGE TAKE ONE TABLET BY MOUTH EVERY MORNING WITH BREAKFAST What changed:  See the new instructions.   montelukast 10 MG tablet Commonly known as:  SINGULAIR TAKE ONE TABLET BY MOUTH AT BEDTIME   nicotine 14 mg/24hr patch Commonly known as:  NICODERM CQ - dosed in mg/24 hours Place 1 patch (14 mg total) onto the skin daily. Start taking on:  December 12, 2018   ondansetron 4 MG disintegrating tablet Commonly known as:  ZOFRAN-ODT Take 4 mg by mouth every 8 (eight) hours as needed for nausea/vomiting.   OXYCONTIN 10 mg 12 hr tablet Generic drug:  oxyCODONE Take 10 mg by mouth 2 (two) times daily.   OXYGEN Inhale 2 L into the lungs continuous.   polyethylene glycol packet Commonly known as:  MIRALAX / GLYCOLAX Take 17 g by mouth daily.   potassium chloride 10 MEQ tablet Commonly known as:  K-DUR Take 1 tablet (10 mEq total) by mouth 2 (two) times daily.   predniSONE 10 MG (21) Tbpk tablet Commonly known as:  STERAPRED UNI-PAK 21 TAB Taper by 67m until complete   sucralfate 1 g tablet Commonly known as:  CARAFATE Take 1 tablet by mouth 2 (two) times daily.   tiotropium 18 MCG inhalation capsule Commonly known as:  SPIRIVA HANDIHALER Place 1 capsule (18 mcg total) into inhaler and inhale daily.   valACYclovir 1000 MG tablet Commonly known as:  VALTREX Take 1 tablet (1,000 mg total) by mouth daily  as needed (for fever  blisters).            Durable Medical Equipment  (From admission, onward)         Start     Ordered   12/11/18 1107  DME Oxygen  Once    Question Answer Comment  Mode or (Route) Nasal cannula   Liters per Minute 2   Frequency Continuous (stationary and portable oxygen unit needed)   Oxygen delivery system Gas      12/11/18 1106             Total Time in preparing paper work, data evaluation and todays exam - 67 minutes  Dustin Flock M.D on 12/11/2018 at 1:13 PM [place]  [phone]

## 2018-12-11 NOTE — Progress Notes (Signed)
Patient discharged home with daughter. Patient verbalized understanding of education.

## 2018-12-11 NOTE — Care Management Note (Signed)
Case Management Note  Patient Details  Name: Megan Bolton MRN: [id number] Date of Birth: 1941-10-12  Subjective/Objective: Patient to be discharged per MD order. Orders in place for home health services. Patient currently open to [facility] and is set to discharge with resumption of services. Notified Helene Kelp at [facility] of resumption. DME O2 ordered but patient has had chronic O2 through [facility] for the last 2.5 years. Will ensure she has supplies. Family to transport.                     Action/Plan:   Expected Discharge Date:  12/11/18               Expected Discharge Plan:  [place]  In-House Referral:     Discharge planning Services     Post Acute Care Choice:  Resumption of Svcs/PTA Provider, Home Health Choice offered to:  Patient  DME Arranged:    DME Agency:     HH Arranged:  RN, PT, Nurse's Aide [place] Agency:  [facility] (formerly [employer])  Status of Service:  Completed, signed off  If discussed at [employer], dates discussed:    Additional Comments:  Latanya Maudlin, RN 12/11/2018, 11:24 AM

## 2018-12-12 DIAGNOSIS — G8929 Other chronic pain: Secondary | ICD-10-CM | POA: Diagnosis not present

## 2018-12-12 DIAGNOSIS — I110 Hypertensive heart disease with heart failure: Secondary | ICD-10-CM | POA: Diagnosis not present

## 2018-12-12 DIAGNOSIS — I5032 Chronic diastolic (congestive) heart failure: Secondary | ICD-10-CM | POA: Diagnosis not present

## 2018-12-12 DIAGNOSIS — J9621 Acute and chronic respiratory failure with hypoxia: Secondary | ICD-10-CM | POA: Diagnosis not present

## 2018-12-12 DIAGNOSIS — J449 Chronic obstructive pulmonary disease, unspecified: Secondary | ICD-10-CM | POA: Diagnosis not present

## 2018-12-12 DIAGNOSIS — E785 Hyperlipidemia, unspecified: Secondary | ICD-10-CM | POA: Diagnosis not present

## 2018-12-12 DIAGNOSIS — D649 Anemia, unspecified: Secondary | ICD-10-CM | POA: Diagnosis not present

## 2018-12-12 DIAGNOSIS — E119 Type 2 diabetes mellitus without complications: Secondary | ICD-10-CM | POA: Diagnosis not present

## 2018-12-13 DIAGNOSIS — I5032 Chronic diastolic (congestive) heart failure: Secondary | ICD-10-CM | POA: Diagnosis not present

## 2018-12-13 DIAGNOSIS — I110 Hypertensive heart disease with heart failure: Secondary | ICD-10-CM | POA: Diagnosis not present

## 2018-12-13 DIAGNOSIS — J9621 Acute and chronic respiratory failure with hypoxia: Secondary | ICD-10-CM | POA: Diagnosis not present

## 2018-12-13 DIAGNOSIS — D649 Anemia, unspecified: Secondary | ICD-10-CM | POA: Diagnosis not present

## 2018-12-13 DIAGNOSIS — G8929 Other chronic pain: Secondary | ICD-10-CM | POA: Diagnosis not present

## 2018-12-13 DIAGNOSIS — E785 Hyperlipidemia, unspecified: Secondary | ICD-10-CM | POA: Diagnosis not present

## 2018-12-13 DIAGNOSIS — Z7951 Long term (current) use of inhaled steroids: Secondary | ICD-10-CM | POA: Diagnosis not present

## 2018-12-13 DIAGNOSIS — E119 Type 2 diabetes mellitus without complications: Secondary | ICD-10-CM | POA: Diagnosis not present

## 2018-12-13 DIAGNOSIS — J449 Chronic obstructive pulmonary disease, unspecified: Secondary | ICD-10-CM | POA: Diagnosis not present

## 2018-12-15 DIAGNOSIS — Z7951 Long term (current) use of inhaled steroids: Secondary | ICD-10-CM | POA: Diagnosis not present

## 2018-12-15 DIAGNOSIS — G8929 Other chronic pain: Secondary | ICD-10-CM | POA: Diagnosis not present

## 2018-12-15 DIAGNOSIS — D649 Anemia, unspecified: Secondary | ICD-10-CM | POA: Diagnosis not present

## 2018-12-15 DIAGNOSIS — I5032 Chronic diastolic (congestive) heart failure: Secondary | ICD-10-CM | POA: Diagnosis not present

## 2018-12-15 DIAGNOSIS — J449 Chronic obstructive pulmonary disease, unspecified: Secondary | ICD-10-CM | POA: Diagnosis not present

## 2018-12-15 DIAGNOSIS — E785 Hyperlipidemia, unspecified: Secondary | ICD-10-CM | POA: Diagnosis not present

## 2018-12-15 DIAGNOSIS — I110 Hypertensive heart disease with heart failure: Secondary | ICD-10-CM | POA: Diagnosis not present

## 2018-12-15 DIAGNOSIS — J9621 Acute and chronic respiratory failure with hypoxia: Secondary | ICD-10-CM | POA: Diagnosis not present

## 2018-12-15 DIAGNOSIS — E119 Type 2 diabetes mellitus without complications: Secondary | ICD-10-CM | POA: Diagnosis not present

## 2018-12-19 ENCOUNTER — Encounter: Payer: Self-pay | Admitting: Emergency Medicine

## 2018-12-19 ENCOUNTER — Emergency Department: Payer: Medicare HMO

## 2018-12-19 ENCOUNTER — Emergency Department
Admission: EM | Admit: 2018-12-19 | Discharge: 2018-12-19 | Disposition: A | Discharge status: Home / Self Care | Payer: Medicare HMO | Attending: Emergency Medicine | Admitting: Emergency Medicine

## 2018-12-19 ENCOUNTER — Other Ambulatory Visit: Payer: Self-pay

## 2018-12-19 DIAGNOSIS — Z7984 Long term (current) use of oral hypoglycemic drugs: Secondary | ICD-10-CM | POA: Diagnosis not present

## 2018-12-19 DIAGNOSIS — R5383 Other fatigue: Secondary | ICD-10-CM | POA: Diagnosis not present

## 2018-12-19 DIAGNOSIS — Z79899 Other long term (current) drug therapy: Secondary | ICD-10-CM | POA: Insufficient documentation

## 2018-12-19 DIAGNOSIS — I5032 Chronic diastolic (congestive) heart failure: Secondary | ICD-10-CM | POA: Diagnosis not present

## 2018-12-19 DIAGNOSIS — I130 Hypertensive heart and chronic kidney disease with heart failure and stage 1 through stage 4 chronic kidney disease, or unspecified chronic kidney disease: Secondary | ICD-10-CM | POA: Diagnosis not present

## 2018-12-19 DIAGNOSIS — Z87891 Personal history of nicotine dependence: Secondary | ICD-10-CM | POA: Insufficient documentation

## 2018-12-19 DIAGNOSIS — I10 Essential (primary) hypertension: Secondary | ICD-10-CM | POA: Diagnosis not present

## 2018-12-19 DIAGNOSIS — E1140 Type 2 diabetes mellitus with diabetic neuropathy, unspecified: Secondary | ICD-10-CM | POA: Insufficient documentation

## 2018-12-19 DIAGNOSIS — J40 Bronchitis, not specified as acute or chronic: Secondary | ICD-10-CM

## 2018-12-19 DIAGNOSIS — J449 Chronic obstructive pulmonary disease, unspecified: Secondary | ICD-10-CM | POA: Diagnosis not present

## 2018-12-19 DIAGNOSIS — R05 Cough: Secondary | ICD-10-CM

## 2018-12-19 DIAGNOSIS — N189 Chronic kidney disease, unspecified: Secondary | ICD-10-CM | POA: Insufficient documentation

## 2018-12-19 DIAGNOSIS — E1122 Type 2 diabetes mellitus with diabetic chronic kidney disease: Secondary | ICD-10-CM | POA: Insufficient documentation

## 2018-12-19 DIAGNOSIS — R059 Cough, unspecified: Secondary | ICD-10-CM

## 2018-12-19 DIAGNOSIS — R0602 Shortness of breath: Secondary | ICD-10-CM | POA: Diagnosis not present

## 2018-12-19 LAB — BASIC METABOLIC PANEL
Anion gap: 6 (ref 5–15)
BUN: 32 mg/dL — AB (ref 8–23)
CALCIUM: 8.5 mg/dL — AB (ref 8.9–10.3)
CO2: 30 mmol/L (ref 22–32)
Chloride: 99 mmol/L (ref 98–111)
Creatinine, Ser: 1.1 mg/dL — ABNORMAL HIGH (ref 0.44–1.00)
GFR calc Af Amer: 56 mL/min — ABNORMAL LOW (ref >60)
GFR calc non Af Amer: 48 mL/min — ABNORMAL LOW (ref >60)
Glucose, Bld: 253 mg/dL — ABNORMAL HIGH (ref 70–99)
Potassium: 4.5 mmol/L (ref 3.5–5.1)
Sodium: 135 mmol/L (ref 135–145)

## 2018-12-19 LAB — CBC
HCT: 39.8 % (ref 36.0–46.0)
Hemoglobin: 12.5 g/dL (ref 12.0–15.0)
MCH: 29.2 pg (ref 26.0–34.0)
MCHC: 31.4 g/dL (ref 30.0–36.0)
MCV: 93 fL (ref 80.0–100.0)
PLATELETS: 260 10*3/uL (ref 150–400)
RBC: 4.28 MIL/uL (ref 3.87–5.11)
RDW: 13.1 % (ref 11.5–15.5)
WBC: 15.1 10*3/uL — ABNORMAL HIGH (ref 4.0–10.5)
nRBC: 0 % (ref 0.0–0.2)

## 2018-12-19 LAB — TROPONIN I
Troponin I: 0.05 ng/mL (ref <0.03)
Troponin I: 0.05 ng/mL (ref <0.03)

## 2018-12-19 MED ORDER — ACETYLCYSTEINE 20 % IN SOLN
4.0000 mL | Freq: Once | RESPIRATORY_TRACT | Status: AC
  Administered 2018-12-19: 4 mL via RESPIRATORY_TRACT
  Filled 2018-12-19 (×2): qty 4

## 2018-12-19 MED ORDER — ACETYLCYSTEINE 20 % IN SOLN: 4.0000 mL | RESPIRATORY_TRACT | 12 refills | Status: AC | PRN

## 2018-12-19 NOTE — ED Notes (Signed)
Pt beng processed and unable to make labels at this time. Called lab and Vickii Chafe stated she was out and I could make labels. No labels allowed still at this time. Chart label to be sent and lab aware.

## 2018-12-19 NOTE — ED Notes (Signed)
This RN contacted pharmacy. Pharmacy tech informed this RN that they would verify medication and send it to main ED shortly.

## 2018-12-19 NOTE — ED Triage Notes (Signed)
Pt states was treated here last week for bronchitis and states she feels worse. Pt reports continued cough and congestion and now feels weak. Pt states is a heart failure patient and is oxygen dependent.

## 2018-12-19 NOTE — Discharge Instructions (Addendum)
Please seek medical attention for any high fevers, chest pain, shortness of breath, change in behavior, persistent vomiting, bloody stool or any other new or concerning symptoms.  

## 2018-12-19 NOTE — ED Notes (Signed)
Report given to Annie RN 

## 2018-12-19 NOTE — ED Provider Notes (Signed)
[facility] Emergency Department Provider Note  ____________________________________________   I have reviewed the triage vital signs and the nursing notes.   HISTORY  Chief Complaint Cough  History limited by: Not Limited   HPI Megan Bolton is a 77 y.o. female who presents to the emergency department today with concern for continued cough and fatigue. The patient had been seen in the emergency department almost 2 weeks ago. States she was treated with a course of prednisone and a brief course of azithromycin. Since that time she has continued to have cough. It is severe enough that it is making it difficult for her to sleep. She feels like she has congestion that she cannot get up. She has been trying cough medication (robotussin DM, mucinex) without significant relief. Has been trying her home nebulizer solution which only gives small amount of relief. She denies any chest pain. She denies any fever. States she has gained roughly 1 pound over the past couple of days, but denies any swelling.  Per medical record review patient has a history of CHF, COPD. Recent ER visit.   Past Medical History:  Diagnosis Date  . CHF (congestive heart failure) ([place])   . COPD (chronic obstructive pulmonary disease) ([place])   . Diabetes mellitus without complication ([place])   . Diastolic heart failure ([place])   . Hyperlipidemia   . Hypertension     Patient Active Problem List   Diagnosis Date Noted  . COPD with acute exacerbation ([place]) 12/07/2018  . Diabetes ([place]) 12/07/2018  . COPD with exacerbation ([place]) 12/07/2018  . Acute on chronic diastolic CHF (congestive heart failure) ([place]) 11/06/2018  . Acute on chronic respiratory failure with hypoxia ([place]) 09/25/2018  . Acute hypoxemic respiratory failure ([place]) 05/25/2018  . CHF (congestive heart failure) ([place]) 12/19/2017  . Restless leg syndrome 09/07/2017  . CHF exacerbation ([place]) 08/30/2017  . Adjustment disorder with mixed  anxiety and depressed mood 08/13/2017  . Peripheral neuropathy 07/14/2017  . Malignant hypertension 01/29/2017  . Chronic respiratory failure with hypoxia, on home O2 therapy ([place]) 12/24/2016  . Incomplete emptying of bladder 07/19/2016  . Bacteriuria 07/17/2016  . Infection due to ESBL-producing Escherichia coli 04/21/2016  . Frequent UTI 04/21/2016  . OSA on CPAP 04/18/2016  . Bladder infection, chronic 04/17/2016  . Calculus of kidney 04/17/2016  . Community acquired pneumonia 02/28/2016  . [name] (chronic kidney disease) 07/12/2015  . Controlled type 2 diabetes mellitus with diabetic neuropathy, without long-term current use of insulin ([place]) 06/11/2015  . Arthritis, degenerative 06/11/2015  . Hypercholesteremia 06/11/2015  . 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 Injury of kidney 04/14/2015  . HTN (hypertension) 04/14/2015  . Blood glucose elevated 04/14/2015  . Apnea, sleep 04/14/2015  . COPD (chronic obstructive pulmonary disease) ([place]) 04/14/2015  . DDD (degenerative disc disease), lumbar 08/01/2014  . Lumbar canal stenosis 05/11/2014  . Neuritis or radiculitis due to rupture of lumbar intervertebral disc 05/11/2014  . 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 Lumbosacral spondylosis 05/11/2014  . Thoracic and lumbosacral neuritis 05/11/2014  . Accelerating angina ([place]) 03/13/2014    Past Surgical History:  Procedure Laterality Date  . ABDOMINAL HYSTERECTOMY    . SPINE SURGERY      Prior to Admission medications   Medication Sig Start Date End Date Taking? Authorizing Provider  ACCU-CHEK AVIVA PLUS test strip 1 each by Other route 2 (two) times daily. 09/08/17   Karamalegos, Devonne Doughty, DO  ACCU-CHEK SOFTCLIX LANCETS lancets 1 each  by Other route 2 (two) times daily. 09/08/17   Karamalegos, Devonne Doughty, DO  albuterol (VENTOLIN HFA) 108 (90 Base) MCG/ACT inhaler Inhale 2 puffs into the lungs every 6 (six) hours as needed for wheezing or  shortness of breath. 05/12/17   Karamalegos, Devonne Doughty, DO  aspirin EC 81 MG tablet Take 81 mg by mouth daily.     [provider]  Blood Glucose Monitoring Suppl (ACCU-CHEK AVIVA PLUS) w/Device KIT USE AS DIRECTED 01/12/17   Arlis Porta., MD  carvedilol (COREG) 12.5 MG tablet Take 1 tablet (12.5 mg total) by mouth 2 (two) times daily. Patient taking differently: Take 12.5 mg by mouth daily.  09/28/18   Vaughan Basta, MD  clonazePAM (KLONOPIN) 0.5 MG tablet Take 0.5 mg by mouth at bedtime as needed.  07/04/15   [provider]  cloNIDine (CATAPRES) 0.1 MG tablet 1 tablet daily as needed for systolic blood pressure (top number) greater than 409 or diastolic blood pressure (bottom number) greater than 105 11/09/18   Wieting, Richard, MD  Fluticasone-Salmeterol (ADVAIR DISKUS) 250-50 MCG/DOSE AEPB Inhale 1 puff into the lungs 2 (two) times daily. 05/04/17   Karamalegos, Devonne Doughty, DO  furosemide (LASIX) 20 MG tablet Alternate one pill orally every other day, 2 pills po every onther day Patient taking differently: Take 20 mg by mouth daily. Take one tablet by mouth daily, take 2 tablets by mouth only if needed for swelling 11/09/18   Loletha Grayer, MD  gabapentin (NEURONTIN) 800 MG tablet Take 1 tablet (800 mg total) by mouth 3 (three) times daily. 12/01/16   Arlis Porta., MD  guaiFENesin-dextromethorphan [facility] DM) 100-10 MG/5ML syrup Take 5 mLs by mouth every 4 (four) hours as needed for cough. 12/11/18   Dustin Flock, MD  ipratropium-albuterol (DUONEB) 0.5-2.5 (3) MG/3ML SOLN Take 3 mLs by nebulization every 6 (six) hours as needed. 12/11/18 01/10/19  Dustin Flock, MD  irbesartan (AVAPRO) 75 MG tablet Take 1 tablet (75 mg total) by mouth daily. 11/09/18 11/09/19  Loletha Grayer, MD  metFORMIN (GLUCOPHAGE) 500 MG tablet TAKE ONE TABLET BY MOUTH EVERY MORNING WITH BREAKFAST Patient taking differently: Take 500 mg by mouth daily.  03/24/17   Arlis Porta., MD  montelukast (SINGULAIR) 10 MG tablet TAKE ONE TABLET BY MOUTH AT BEDTIME 03/24/17   Arlis Porta., MD  nicotine (NICODERM CQ - DOSED IN MG/24 HOURS) 14 mg/24hr patch Place 1 patch (14 mg total) onto the skin daily. 12/12/18   Dustin Flock, MD  ondansetron (ZOFRAN-ODT) 4 MG disintegrating tablet Take 4 mg by mouth every 8 (eight) hours as needed for nausea/vomiting. 08/18/18   [provider]  OXYCONTIN 10 MG 12 hr tablet Take 10 mg by mouth 2 (two) times daily.    [provider]  OXYGEN Inhale 2 L into the lungs continuous.    [provider]  polyethylene glycol (MIRALAX / GLYCOLAX) packet Take 17 g by mouth daily.  05/10/18   [provider]  potassium chloride (K-DUR) 10 MEQ tablet Take 1 tablet (10 mEq total) by mouth 2 (two) times daily. 10/12/17   Karamalegos, Devonne Doughty, DO  predniSONE (STERAPRED UNI-PAK 21 TAB) 10 MG (21) TBPK tablet Taper by 67m until complete 12/11/18   PDustin Flock MD  sucralfate (CARAFATE) 1 g tablet Take 1 tablet by mouth 2 (two) times daily.  02/15/18 02/15/19  [provider]  tiotropium (SPIRIVA HANDIHALER) 18 MCG inhalation capsule Place 1 capsule (18 mcg  total) into inhaler and inhale daily. 08/27/17   Karamalegos, Devonne Doughty, DO  valACYclovir (VALTREX) 1000 MG tablet Take 1 tablet (1,000 mg total) by mouth daily as needed (for fever blisters). 04/19/16   Gladstone Lighter, MD    Allergies Nitrofurantoin  Family History  Problem Relation Age of Onset  . Hypertension Mother   . Colon cancer Father   . Hypertension Paternal Grandfather   . Bladder Cancer Neg Hx   . Kidney cancer Neg Hx     Social History Social History   Tobacco Use  . Smoking status: Former Smoker    Packs/day: 0.50    Last attempt to quit: 04/28/2015    Years since quitting: 3.6  . Smokeless tobacco: Never Used  Substance Use Topics  . Alcohol use: No    Alcohol/week: 0.0 standard drinks  . Drug use: No     Review of Systems Constitutional: Positive for fatigue.  Eyes: No visual changes. ENT: No sore throat. Cardiovascular: Denies chest pain. Respiratory: Positive for cough. Gastrointestinal: No abdominal pain.  No nausea, no vomiting.  No diarrhea.   Genitourinary: Negative for dysuria. Musculoskeletal: Negative for back pain. Skin: Negative for rash. Neurological: Negative for headaches, focal weakness or numbness.  ____________________________________________   PHYSICAL EXAM:  VITAL SIGNS: ED Triage Vitals [12/19/18 1132]  Enc Vitals Group     BP (!) 103/49     Pulse Rate 69     Resp 20     Temp 98.3 F (36.8 C)     Temp Source Oral     SpO2 95 %     Weight 230 lb (104.3 kg)     Height 5' 4"  (1.626 m)     Head Circumference      Peak Flow      Pain Score 0   Constitutional: Alert and oriented.  Eyes: Conjunctivae are normal.  ENT      Head: Normocephalic and atraumatic.      Nose: No congestion/rhinnorhea.      Mouth/Throat: Mucous membranes are moist.      Neck: No stridor. Hematological/Lymphatic/Immunilogical: No cervical lymphadenopathy. Cardiovascular: Normal rate, regular rhythm.  No murmurs, rubs, or gallops.  Respiratory: Normal respiratory effort without tachypnea nor retractions. Breath sounds are clear and equal bilaterally. No wheezes/rales/rhonchi. Occasional dry cough. Gastrointestinal: Soft and non tender. No rebound. No guarding.  Genitourinary: Deferred Musculoskeletal: Normal range of motion in all extremities. No lower extremity edema. Neurologic:  Normal speech and language. No gross focal neurologic deficits are appreciated.  Skin:  Skin is warm, dry and intact. No rash noted. Psychiatric: Mood and affect are normal. Speech and behavior are normal. Patient exhibits appropriate insight and judgment.  ____________________________________________    LABS (pertinent positives/negatives)  BMP wnl except glu 253, crt 1.10, ca 8.5 CBC wbc  15.1, hgb 12.5, plt 260 Trop 0.05 x 2  ____________________________________________   EKG  I, Nance Pear, attending physician, personally viewed and interpreted this EKG  EKG Time: 1139 Rate: 70 Rhythm: normal sinus rhythm Axis: normal Intervals: qtc 429 QRS: narrow, q waves III, v1 ST changes: no st elevation Impression: abnormal ekg  ____________________________________________    RADIOLOGY  CXR Pulmonary vascular congestion ____________________________________________   PROCEDURES  Procedures  ____________________________________________   INITIAL IMPRESSION / ASSESSMENT AND PLAN / ED COURSE  Pertinent labs & imaging results that were available during my care of the patient were reviewed by me and considered in my medical decision making (see chart for details).   Patient  presented to the emergency department today because of concerns for continued cough and fatigue.  On exam patient's lungs were clear.  I did not appreciate any wheezing.  The patient chest x-ray did not show any pneumonia.  Initial troponin elevated 0.05 however remained elevated on second test.  Patient does have a history of CHF and given lack of chest pain I doubt this represents ACS.  Patient was given Mucomyst and did feel better afterwards.  States she was able to get up a lot of secretions.  Will give patient prescription for Mucomyst.  ____________________________________________   FINAL CLINICAL IMPRESSION(S) / ED DIAGNOSES  Final diagnoses:  Cough  Bronchitis     Note: This dictation was prepared with Dragon dictation. Any transcriptional errors that result from this process are unintentional     Nance Pear, MD 12/19/18 [phone]

## 2018-12-20 DIAGNOSIS — G8929 Other chronic pain: Secondary | ICD-10-CM | POA: Diagnosis not present

## 2018-12-20 DIAGNOSIS — E119 Type 2 diabetes mellitus without complications: Secondary | ICD-10-CM | POA: Diagnosis not present

## 2018-12-20 DIAGNOSIS — J449 Chronic obstructive pulmonary disease, unspecified: Secondary | ICD-10-CM | POA: Diagnosis not present

## 2018-12-20 DIAGNOSIS — Z7951 Long term (current) use of inhaled steroids: Secondary | ICD-10-CM | POA: Diagnosis not present

## 2018-12-20 DIAGNOSIS — D649 Anemia, unspecified: Secondary | ICD-10-CM | POA: Diagnosis not present

## 2018-12-20 DIAGNOSIS — J9621 Acute and chronic respiratory failure with hypoxia: Secondary | ICD-10-CM | POA: Diagnosis not present

## 2018-12-20 DIAGNOSIS — I110 Hypertensive heart disease with heart failure: Secondary | ICD-10-CM | POA: Diagnosis not present

## 2018-12-20 DIAGNOSIS — E785 Hyperlipidemia, unspecified: Secondary | ICD-10-CM | POA: Diagnosis not present

## 2018-12-20 DIAGNOSIS — I5032 Chronic diastolic (congestive) heart failure: Secondary | ICD-10-CM | POA: Diagnosis not present

## 2018-12-23 ENCOUNTER — Telehealth: Payer: Self-pay | Admitting: Urology

## 2018-12-23 NOTE — Telephone Encounter (Signed)
I called pt to have her get RUS scheduled.  Pt didn't want to have RUS done and cancelled appt with [facility].

## 2018-12-26 DIAGNOSIS — I10 Essential (primary) hypertension: Secondary | ICD-10-CM | POA: Diagnosis not present

## 2018-12-26 DIAGNOSIS — E119 Type 2 diabetes mellitus without complications: Secondary | ICD-10-CM | POA: Diagnosis not present

## 2018-12-26 DIAGNOSIS — J209 Acute bronchitis, unspecified: Secondary | ICD-10-CM | POA: Diagnosis not present

## 2018-12-26 DIAGNOSIS — J439 Emphysema, unspecified: Secondary | ICD-10-CM | POA: Diagnosis not present

## 2018-12-29 ENCOUNTER — Ambulatory Visit: Payer: Medicare HMO | Admitting: Urology

## 2019-01-03 NOTE — Telephone Encounter (Signed)
Please let Mrs. Kruschke know that the ultrasound of her kidney is to monitor the blockage in her kidney and it is important for her to have this done.

## 2019-01-03 NOTE — Telephone Encounter (Signed)
Left message for patient to call office to inform her why she needs the kidney ultrasound per Zara Council.

## 2019-01-06 DIAGNOSIS — J9621 Acute and chronic respiratory failure with hypoxia: Secondary | ICD-10-CM | POA: Diagnosis not present

## 2019-01-06 DIAGNOSIS — I5032 Chronic diastolic (congestive) heart failure: Secondary | ICD-10-CM | POA: Diagnosis not present

## 2019-01-06 DIAGNOSIS — Z7951 Long term (current) use of inhaled steroids: Secondary | ICD-10-CM | POA: Diagnosis not present

## 2019-01-06 DIAGNOSIS — D649 Anemia, unspecified: Secondary | ICD-10-CM | POA: Diagnosis not present

## 2019-01-06 DIAGNOSIS — E119 Type 2 diabetes mellitus without complications: Secondary | ICD-10-CM | POA: Diagnosis not present

## 2019-01-06 DIAGNOSIS — I110 Hypertensive heart disease with heart failure: Secondary | ICD-10-CM | POA: Diagnosis not present

## 2019-01-06 DIAGNOSIS — G8929 Other chronic pain: Secondary | ICD-10-CM | POA: Diagnosis not present

## 2019-01-06 DIAGNOSIS — E785 Hyperlipidemia, unspecified: Secondary | ICD-10-CM | POA: Diagnosis not present

## 2019-01-06 DIAGNOSIS — J449 Chronic obstructive pulmonary disease, unspecified: Secondary | ICD-10-CM | POA: Diagnosis not present

## 2019-01-08 DIAGNOSIS — J9621 Acute and chronic respiratory failure with hypoxia: Secondary | ICD-10-CM | POA: Diagnosis not present

## 2019-01-08 DIAGNOSIS — G8929 Other chronic pain: Secondary | ICD-10-CM | POA: Diagnosis not present

## 2019-01-08 DIAGNOSIS — Z7951 Long term (current) use of inhaled steroids: Secondary | ICD-10-CM | POA: Diagnosis not present

## 2019-01-08 DIAGNOSIS — I110 Hypertensive heart disease with heart failure: Secondary | ICD-10-CM | POA: Diagnosis not present

## 2019-01-08 DIAGNOSIS — D649 Anemia, unspecified: Secondary | ICD-10-CM | POA: Diagnosis not present

## 2019-01-08 DIAGNOSIS — I5032 Chronic diastolic (congestive) heart failure: Secondary | ICD-10-CM | POA: Diagnosis not present

## 2019-01-08 DIAGNOSIS — E119 Type 2 diabetes mellitus without complications: Secondary | ICD-10-CM | POA: Diagnosis not present

## 2019-01-08 DIAGNOSIS — E785 Hyperlipidemia, unspecified: Secondary | ICD-10-CM | POA: Diagnosis not present

## 2019-01-08 DIAGNOSIS — J449 Chronic obstructive pulmonary disease, unspecified: Secondary | ICD-10-CM | POA: Diagnosis not present

## 2019-01-09 ENCOUNTER — Encounter: Payer: Self-pay | Admitting: Urology

## 2019-01-09 DIAGNOSIS — N183 Chronic kidney disease, stage 3 (moderate): Secondary | ICD-10-CM | POA: Diagnosis not present

## 2019-01-09 DIAGNOSIS — E876 Hypokalemia: Secondary | ICD-10-CM | POA: Diagnosis not present

## 2019-01-09 DIAGNOSIS — E1122 Type 2 diabetes mellitus with diabetic chronic kidney disease: Secondary | ICD-10-CM | POA: Diagnosis not present

## 2019-01-09 DIAGNOSIS — J449 Chronic obstructive pulmonary disease, unspecified: Secondary | ICD-10-CM | POA: Diagnosis not present

## 2019-01-09 DIAGNOSIS — J438 Other emphysema: Secondary | ICD-10-CM | POA: Diagnosis not present

## 2019-01-09 DIAGNOSIS — I129 Hypertensive chronic kidney disease with stage 1 through stage 4 chronic kidney disease, or unspecified chronic kidney disease: Secondary | ICD-10-CM | POA: Diagnosis not present

## 2019-01-10 ENCOUNTER — Telehealth: Payer: Self-pay | Admitting: Urology

## 2019-01-10 NOTE — Telephone Encounter (Signed)
Certified letter sent 01/10/2019

## 2019-01-11 DIAGNOSIS — J449 Chronic obstructive pulmonary disease, unspecified: Secondary | ICD-10-CM | POA: Diagnosis not present

## 2019-01-11 DIAGNOSIS — R0602 Shortness of breath: Secondary | ICD-10-CM | POA: Diagnosis not present

## 2019-01-11 DIAGNOSIS — R062 Wheezing: Secondary | ICD-10-CM | POA: Diagnosis not present

## 2019-01-11 DIAGNOSIS — J45909 Unspecified asthma, uncomplicated: Secondary | ICD-10-CM | POA: Diagnosis not present

## 2019-01-11 IMAGING — CT CT ANGIO CHEST
2 of 6 series · 17 of 46 positions shown · IV contrast (APPLIED)
Comparison: Chest CT May 24, 2018; chest radiograph November 06, 2018

CLINICAL DATA: Shortness of breath and chest tightness

EXAM:
CT ANGIOGRAPHY CHEST WITH CONTRAST
TECHNIQUE: Multidetector CT imaging of the chest was performed using the
standard protocol during bolus administration of intravenous
contrast. Multiplanar CT image reconstructions and MIPs were
obtained to evaluate the vascular anatomy.
CONTRAST:  75mL GXGK4X-AKF IOPAMIDOL (GXGK4X-AKF) INJECTION 76%

[Series 5: thins · axial · 0.74mm/px · z∈[-688,-428]mm · 15 of 286 slices shown]
[im 13/286  lung]
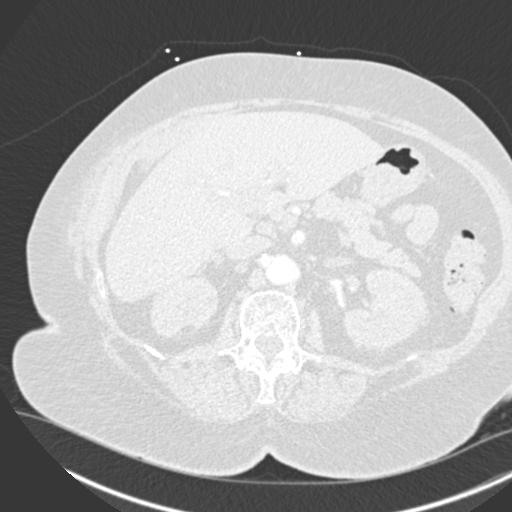
[im 38/286  soft-tissue]
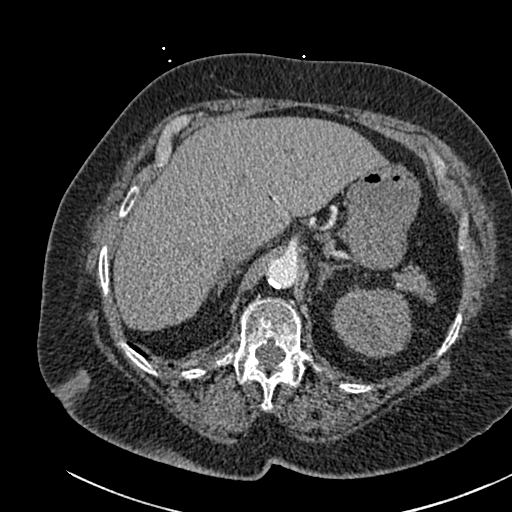
[im 50/286  lung]
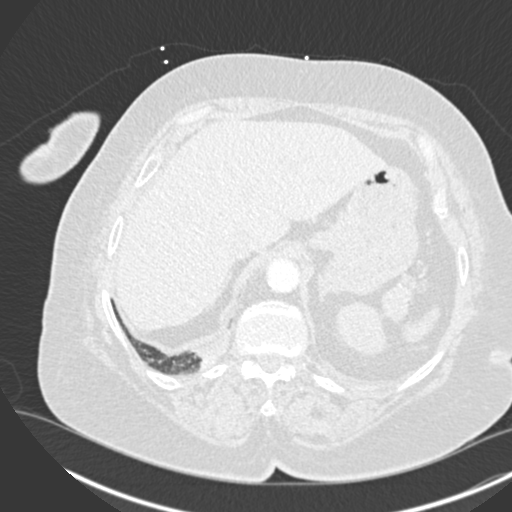
[im 75/286  soft-tissue]
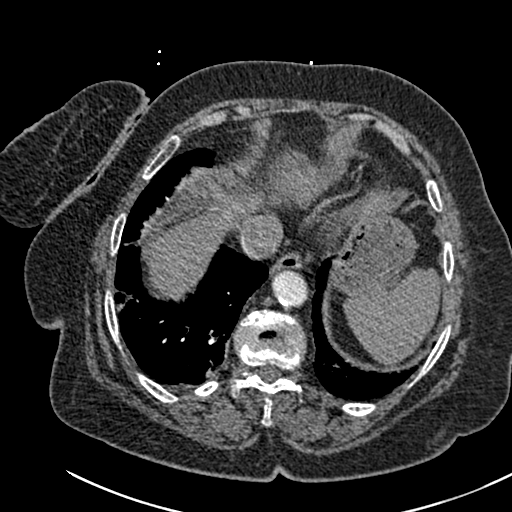
[im 87/286  lung]
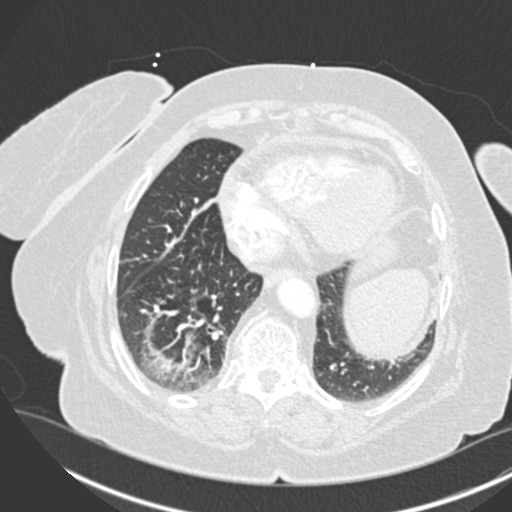
[im 112/286  soft-tissue]
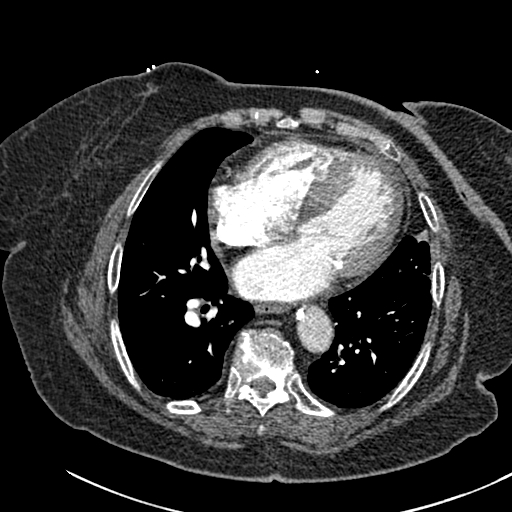
[im 124/286  lung]
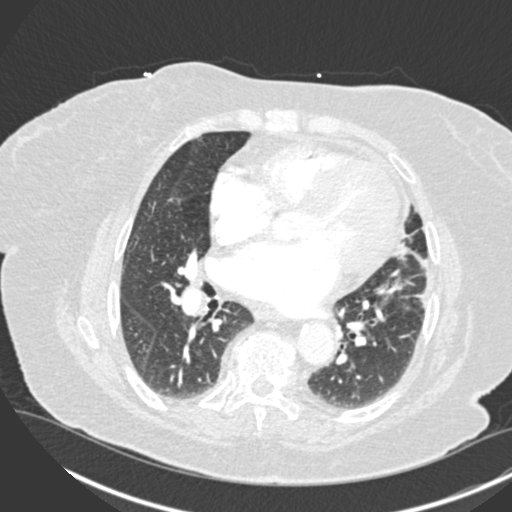
[im 149/286  soft-tissue]
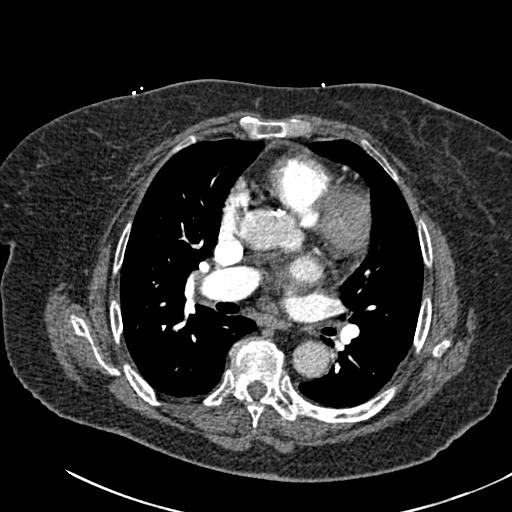
[im 162/286  lung]
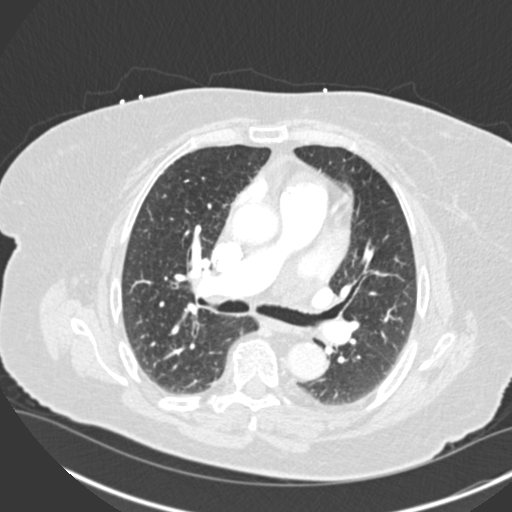
[im 174/286  soft-tissue]
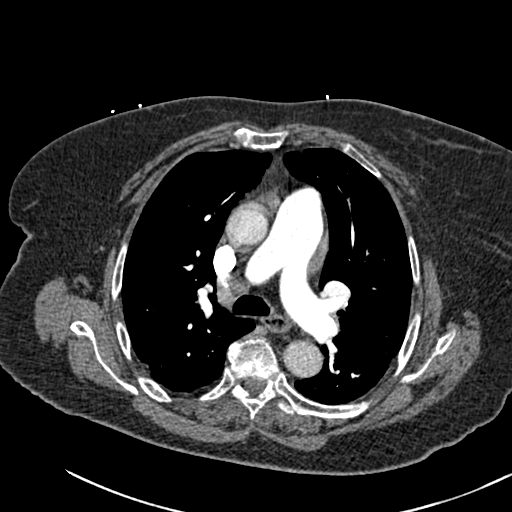
[im 199/286  lung]
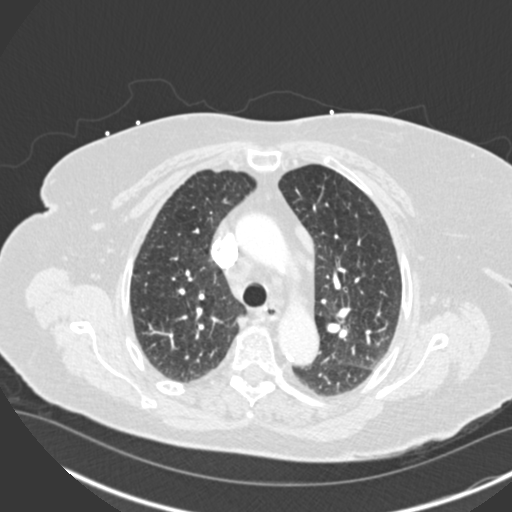
[im 211/286  soft-tissue]
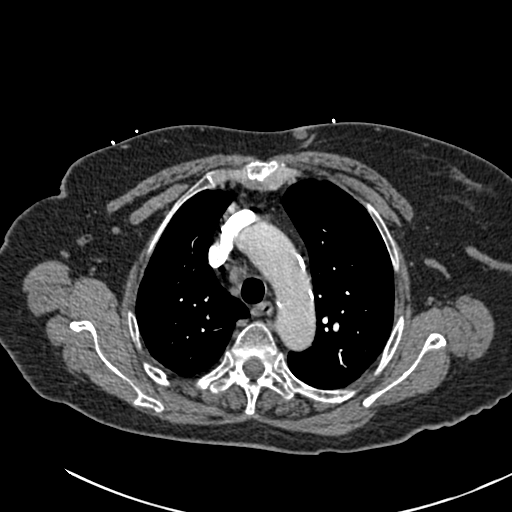
[im 236/286  lung]
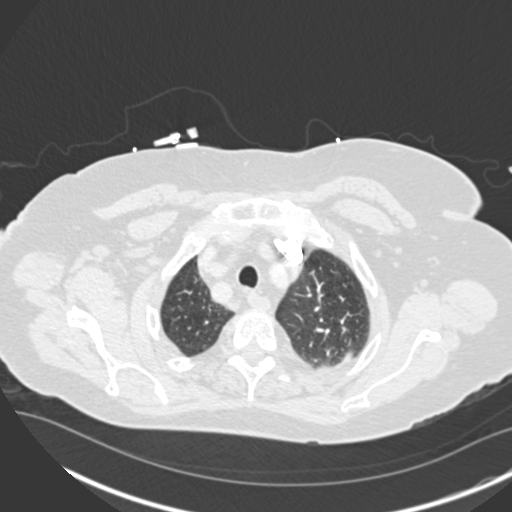
[im 248/286  soft-tissue]
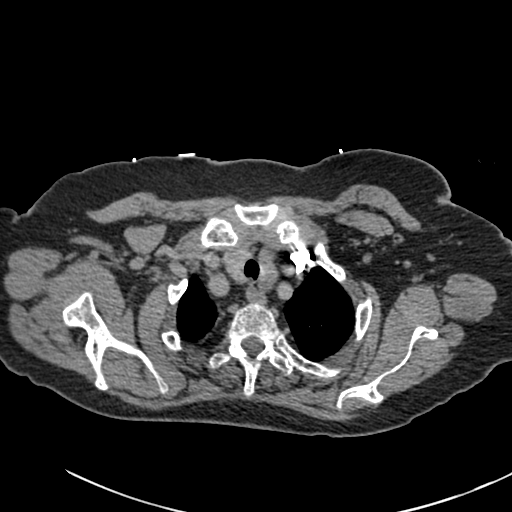
[im 273/286  lung]
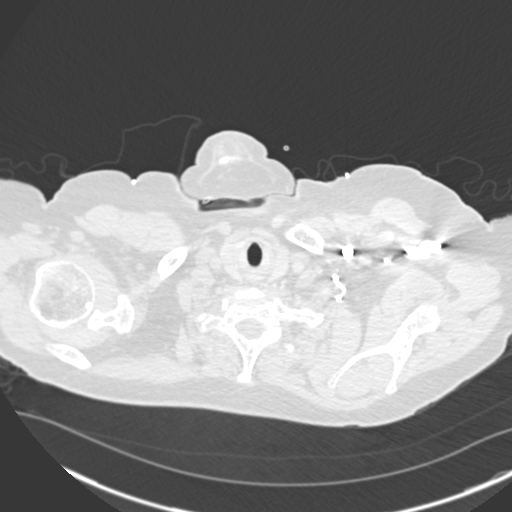

[Series 7: coronal mpr · coronal · 0.59mm/px · 2 of 100 slices shown]
[im 34/100  soft-tissue]
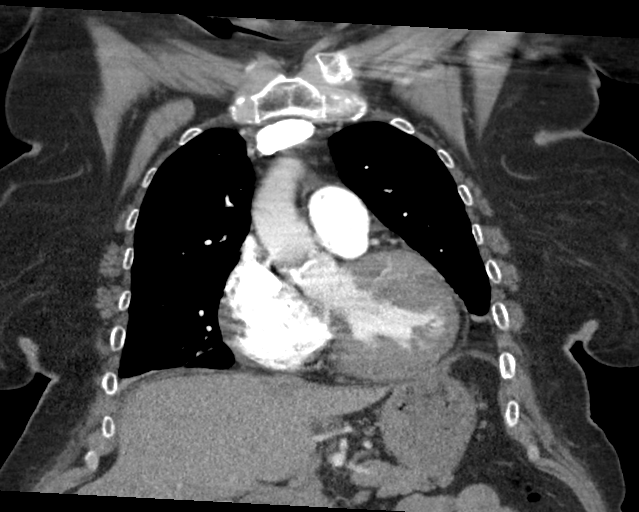
[im 67/100  soft-tissue]
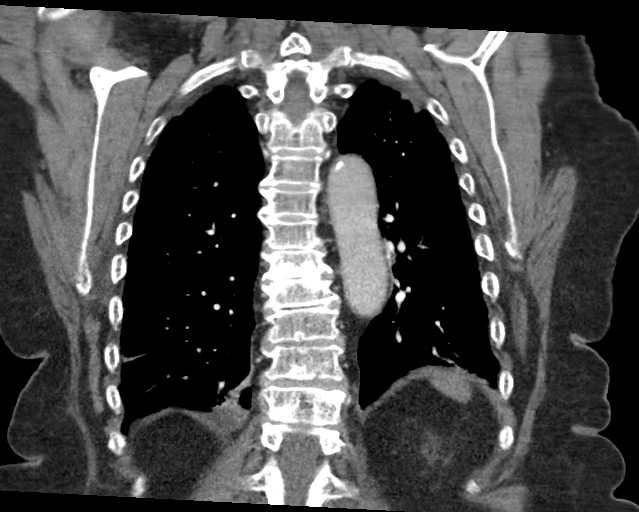

[17 of 46 positions shown; findings below may reference images not displayed]

FINDINGS: Cardiovascular: There is no demonstrable pulmonary embolus. There is
no thoracic aortic aneurysm or dissection. There is calcification at
the origin of the left subclavian artery. Other visualized great
vessels appear unremarkable. There is aortic atherosclerosis. There
are foci of coronary artery calcification evident. There is a small
amount of pericardial fluid, within the physiologic range, stable.
Pericardium does not appear appreciably thickened.

Prominence of the main pulmonary outflow tract is noted with a
measured diameter of 3.7 cm.

Mediastinum/Nodes: There is a subcentimeter nodular opacity in the
left lobe of the thyroid, stable. There are multiple subcentimeter
mediastinal lymph nodes again noted. There are prominent right
paratracheal lymph nodes with largest lymph node in this area
measuring 1.8 x 1.4 cm, marginally smaller than on prior study.
Aortopulmonary window lymph nodes appear slightly smaller than on
previous study. No new lymph node prominence is evident on this
study. No esophageal lesions are evident.

Lungs/Pleura: There is atelectatic change in the lung base regions,
stable. There is no frank edema or consolidation. A mild degree of
centrilobular emphysematous change appears stable. No appreciable
pleural effusion.

Upper Abdomen: There is a cyst in the medial upper pole right kidney
measuring 1.5 x 1.5 cm. A cyst is noted in the lateral mid left
kidney measuring 1.5 x 1.3 cm. There is aortic atherosclerosis as
well as calcification in the proximal visualized great vessels.
Visualized upper abdominal structures otherwise appear unremarkable.

Musculoskeletal: There is stable anterior wedging of the L1
vertebral body. There is multilevel degenerative change in the
thoracic spine which appear stable. No blastic or lytic bone lesions
are evident. No evident chest wall lesions.

Review of the MIP images confirms the above findings.
IMPRESSION: 1. No demonstrable pulmonary embolus. No thoracic aortic aneurysm or
dissection. There is aortic atherosclerosis. There are foci of great
vessel and major mesenteric arterial calcification with foci of
coronary artery calcification noted as well.

2. Prominence of the main pulmonary outflow tract, a finding felt to
be indicative of a degree of pulmonary arterial hypertension.

3. Note that there is calcification in both proximal renal arteries.
In this regard, question whether patient is hypertensive due to
renal artery stenosis.

4. There is mild centrilobular emphysematous change, stable. There
is atelectatic change, primarily in the bases. No frank edema or
consolidation.

5. Prominent paratracheal lymph nodes, marginally smaller than on
previous study. No new lymph node prominence compared to previous
study.

6. Small amount of pericardial fluid, likely in the physiologic
range.

Aortic Atherosclerosis (R460X-WNP.P) and Emphysema (R460X-P1G.P).

## 2019-01-12 DIAGNOSIS — J9621 Acute and chronic respiratory failure with hypoxia: Secondary | ICD-10-CM | POA: Diagnosis not present

## 2019-01-12 DIAGNOSIS — Z7951 Long term (current) use of inhaled steroids: Secondary | ICD-10-CM | POA: Diagnosis not present

## 2019-01-12 DIAGNOSIS — E119 Type 2 diabetes mellitus without complications: Secondary | ICD-10-CM | POA: Diagnosis not present

## 2019-01-12 DIAGNOSIS — E785 Hyperlipidemia, unspecified: Secondary | ICD-10-CM | POA: Diagnosis not present

## 2019-01-12 DIAGNOSIS — I110 Hypertensive heart disease with heart failure: Secondary | ICD-10-CM | POA: Diagnosis not present

## 2019-01-12 DIAGNOSIS — J449 Chronic obstructive pulmonary disease, unspecified: Secondary | ICD-10-CM | POA: Diagnosis not present

## 2019-01-12 DIAGNOSIS — I5032 Chronic diastolic (congestive) heart failure: Secondary | ICD-10-CM | POA: Diagnosis not present

## 2019-01-12 DIAGNOSIS — G8929 Other chronic pain: Secondary | ICD-10-CM | POA: Diagnosis not present

## 2019-01-12 DIAGNOSIS — D649 Anemia, unspecified: Secondary | ICD-10-CM | POA: Diagnosis not present

## 2019-01-13 DIAGNOSIS — D649 Anemia, unspecified: Secondary | ICD-10-CM | POA: Diagnosis not present

## 2019-01-13 DIAGNOSIS — I110 Hypertensive heart disease with heart failure: Secondary | ICD-10-CM | POA: Diagnosis not present

## 2019-01-13 DIAGNOSIS — E119 Type 2 diabetes mellitus without complications: Secondary | ICD-10-CM | POA: Diagnosis not present

## 2019-01-13 DIAGNOSIS — J9621 Acute and chronic respiratory failure with hypoxia: Secondary | ICD-10-CM | POA: Diagnosis not present

## 2019-01-13 DIAGNOSIS — J449 Chronic obstructive pulmonary disease, unspecified: Secondary | ICD-10-CM | POA: Diagnosis not present

## 2019-01-13 DIAGNOSIS — Z7951 Long term (current) use of inhaled steroids: Secondary | ICD-10-CM | POA: Diagnosis not present

## 2019-01-13 DIAGNOSIS — I5032 Chronic diastolic (congestive) heart failure: Secondary | ICD-10-CM | POA: Diagnosis not present

## 2019-01-13 DIAGNOSIS — E785 Hyperlipidemia, unspecified: Secondary | ICD-10-CM | POA: Diagnosis not present

## 2019-01-13 DIAGNOSIS — G8929 Other chronic pain: Secondary | ICD-10-CM | POA: Diagnosis not present

## 2019-01-16 DIAGNOSIS — M5430 Sciatica, unspecified side: Secondary | ICD-10-CM | POA: Diagnosis not present

## 2019-01-16 DIAGNOSIS — J449 Chronic obstructive pulmonary disease, unspecified: Secondary | ICD-10-CM | POA: Diagnosis not present

## 2019-01-16 DIAGNOSIS — F17290 Nicotine dependence, other tobacco product, uncomplicated: Secondary | ICD-10-CM | POA: Diagnosis not present

## 2019-01-16 DIAGNOSIS — Z729 Problem related to lifestyle, unspecified: Secondary | ICD-10-CM | POA: Diagnosis not present

## 2019-01-16 DIAGNOSIS — G894 Chronic pain syndrome: Secondary | ICD-10-CM | POA: Diagnosis not present

## 2019-01-16 DIAGNOSIS — Z79899 Other long term (current) drug therapy: Secondary | ICD-10-CM | POA: Diagnosis not present

## 2019-01-16 DIAGNOSIS — E119 Type 2 diabetes mellitus without complications: Secondary | ICD-10-CM | POA: Diagnosis not present

## 2019-01-16 DIAGNOSIS — M5416 Radiculopathy, lumbar region: Secondary | ICD-10-CM | POA: Diagnosis not present

## 2019-01-16 DIAGNOSIS — F1120 Opioid dependence, uncomplicated: Secondary | ICD-10-CM | POA: Diagnosis not present

## 2019-01-16 DIAGNOSIS — R05 Cough: Secondary | ICD-10-CM | POA: Diagnosis not present

## 2019-01-16 DIAGNOSIS — Z7151 Drug abuse counseling and surveillance of drug abuser: Secondary | ICD-10-CM | POA: Diagnosis not present

## 2019-01-17 DIAGNOSIS — J9621 Acute and chronic respiratory failure with hypoxia: Secondary | ICD-10-CM | POA: Diagnosis not present

## 2019-01-17 DIAGNOSIS — I5032 Chronic diastolic (congestive) heart failure: Secondary | ICD-10-CM | POA: Diagnosis not present

## 2019-01-17 DIAGNOSIS — E119 Type 2 diabetes mellitus without complications: Secondary | ICD-10-CM | POA: Diagnosis not present

## 2019-01-17 DIAGNOSIS — J449 Chronic obstructive pulmonary disease, unspecified: Secondary | ICD-10-CM | POA: Diagnosis not present

## 2019-01-17 DIAGNOSIS — D649 Anemia, unspecified: Secondary | ICD-10-CM | POA: Diagnosis not present

## 2019-01-17 DIAGNOSIS — I110 Hypertensive heart disease with heart failure: Secondary | ICD-10-CM | POA: Diagnosis not present

## 2019-01-17 DIAGNOSIS — G8929 Other chronic pain: Secondary | ICD-10-CM | POA: Diagnosis not present

## 2019-01-17 DIAGNOSIS — Z7951 Long term (current) use of inhaled steroids: Secondary | ICD-10-CM | POA: Diagnosis not present

## 2019-01-17 DIAGNOSIS — E785 Hyperlipidemia, unspecified: Secondary | ICD-10-CM | POA: Diagnosis not present

## 2019-01-19 DIAGNOSIS — J9621 Acute and chronic respiratory failure with hypoxia: Secondary | ICD-10-CM | POA: Diagnosis not present

## 2019-01-19 DIAGNOSIS — I110 Hypertensive heart disease with heart failure: Secondary | ICD-10-CM | POA: Diagnosis not present

## 2019-01-19 DIAGNOSIS — D649 Anemia, unspecified: Secondary | ICD-10-CM | POA: Diagnosis not present

## 2019-01-19 DIAGNOSIS — E785 Hyperlipidemia, unspecified: Secondary | ICD-10-CM | POA: Diagnosis not present

## 2019-01-19 DIAGNOSIS — I5032 Chronic diastolic (congestive) heart failure: Secondary | ICD-10-CM | POA: Diagnosis not present

## 2019-01-19 DIAGNOSIS — Z7951 Long term (current) use of inhaled steroids: Secondary | ICD-10-CM | POA: Diagnosis not present

## 2019-01-19 DIAGNOSIS — E119 Type 2 diabetes mellitus without complications: Secondary | ICD-10-CM | POA: Diagnosis not present

## 2019-01-19 DIAGNOSIS — J449 Chronic obstructive pulmonary disease, unspecified: Secondary | ICD-10-CM | POA: Diagnosis not present

## 2019-01-19 DIAGNOSIS — G8929 Other chronic pain: Secondary | ICD-10-CM | POA: Diagnosis not present

## 2019-01-20 DIAGNOSIS — J449 Chronic obstructive pulmonary disease, unspecified: Secondary | ICD-10-CM | POA: Diagnosis not present

## 2019-01-24 DIAGNOSIS — R0602 Shortness of breath: Secondary | ICD-10-CM | POA: Diagnosis not present

## 2019-01-24 DIAGNOSIS — J9621 Acute and chronic respiratory failure with hypoxia: Secondary | ICD-10-CM | POA: Diagnosis not present

## 2019-01-24 DIAGNOSIS — D649 Anemia, unspecified: Secondary | ICD-10-CM | POA: Diagnosis not present

## 2019-01-24 DIAGNOSIS — R0902 Hypoxemia: Secondary | ICD-10-CM | POA: Diagnosis not present

## 2019-01-24 DIAGNOSIS — J449 Chronic obstructive pulmonary disease, unspecified: Secondary | ICD-10-CM | POA: Diagnosis not present

## 2019-01-24 DIAGNOSIS — J441 Chronic obstructive pulmonary disease with (acute) exacerbation: Secondary | ICD-10-CM | POA: Diagnosis not present

## 2019-01-24 DIAGNOSIS — R590 Localized enlarged lymph nodes: Secondary | ICD-10-CM | POA: Diagnosis not present

## 2019-01-24 DIAGNOSIS — Z7951 Long term (current) use of inhaled steroids: Secondary | ICD-10-CM | POA: Diagnosis not present

## 2019-01-24 DIAGNOSIS — E119 Type 2 diabetes mellitus without complications: Secondary | ICD-10-CM | POA: Diagnosis not present

## 2019-01-24 DIAGNOSIS — G8929 Other chronic pain: Secondary | ICD-10-CM | POA: Diagnosis not present

## 2019-01-24 DIAGNOSIS — I272 Pulmonary hypertension, unspecified: Secondary | ICD-10-CM | POA: Diagnosis not present

## 2019-01-24 DIAGNOSIS — I110 Hypertensive heart disease with heart failure: Secondary | ICD-10-CM | POA: Diagnosis not present

## 2019-01-24 DIAGNOSIS — I5032 Chronic diastolic (congestive) heart failure: Secondary | ICD-10-CM | POA: Diagnosis not present

## 2019-01-24 DIAGNOSIS — E785 Hyperlipidemia, unspecified: Secondary | ICD-10-CM | POA: Diagnosis not present

## 2019-01-24 DIAGNOSIS — R05 Cough: Secondary | ICD-10-CM | POA: Diagnosis not present

## 2019-01-24 DIAGNOSIS — I200 Unstable angina: Secondary | ICD-10-CM | POA: Diagnosis not present

## 2019-01-26 DIAGNOSIS — G8929 Other chronic pain: Secondary | ICD-10-CM | POA: Diagnosis not present

## 2019-01-26 DIAGNOSIS — E119 Type 2 diabetes mellitus without complications: Secondary | ICD-10-CM | POA: Diagnosis not present

## 2019-01-26 DIAGNOSIS — I110 Hypertensive heart disease with heart failure: Secondary | ICD-10-CM | POA: Diagnosis not present

## 2019-01-26 DIAGNOSIS — E785 Hyperlipidemia, unspecified: Secondary | ICD-10-CM | POA: Diagnosis not present

## 2019-01-26 DIAGNOSIS — Z7951 Long term (current) use of inhaled steroids: Secondary | ICD-10-CM | POA: Diagnosis not present

## 2019-01-26 DIAGNOSIS — J449 Chronic obstructive pulmonary disease, unspecified: Secondary | ICD-10-CM | POA: Diagnosis not present

## 2019-01-26 DIAGNOSIS — I5032 Chronic diastolic (congestive) heart failure: Secondary | ICD-10-CM | POA: Diagnosis not present

## 2019-01-26 DIAGNOSIS — D649 Anemia, unspecified: Secondary | ICD-10-CM | POA: Diagnosis not present

## 2019-01-26 DIAGNOSIS — J9621 Acute and chronic respiratory failure with hypoxia: Secondary | ICD-10-CM | POA: Diagnosis not present

## 2019-01-31 DIAGNOSIS — I110 Hypertensive heart disease with heart failure: Secondary | ICD-10-CM | POA: Diagnosis not present

## 2019-01-31 DIAGNOSIS — J9621 Acute and chronic respiratory failure with hypoxia: Secondary | ICD-10-CM | POA: Diagnosis not present

## 2019-01-31 DIAGNOSIS — G8929 Other chronic pain: Secondary | ICD-10-CM | POA: Diagnosis not present

## 2019-01-31 DIAGNOSIS — E785 Hyperlipidemia, unspecified: Secondary | ICD-10-CM | POA: Diagnosis not present

## 2019-01-31 DIAGNOSIS — I5020 Unspecified systolic (congestive) heart failure: Secondary | ICD-10-CM | POA: Diagnosis not present

## 2019-01-31 DIAGNOSIS — J449 Chronic obstructive pulmonary disease, unspecified: Secondary | ICD-10-CM | POA: Diagnosis not present

## 2019-01-31 DIAGNOSIS — D649 Anemia, unspecified: Secondary | ICD-10-CM | POA: Diagnosis not present

## 2019-01-31 DIAGNOSIS — E119 Type 2 diabetes mellitus without complications: Secondary | ICD-10-CM | POA: Diagnosis not present

## 2019-01-31 DIAGNOSIS — I5033 Acute on chronic diastolic (congestive) heart failure: Secondary | ICD-10-CM | POA: Diagnosis not present

## 2019-02-02 DIAGNOSIS — I5020 Unspecified systolic (congestive) heart failure: Secondary | ICD-10-CM | POA: Diagnosis not present

## 2019-02-02 DIAGNOSIS — D649 Anemia, unspecified: Secondary | ICD-10-CM | POA: Diagnosis not present

## 2019-02-02 DIAGNOSIS — I5033 Acute on chronic diastolic (congestive) heart failure: Secondary | ICD-10-CM | POA: Diagnosis not present

## 2019-02-02 DIAGNOSIS — G8929 Other chronic pain: Secondary | ICD-10-CM | POA: Diagnosis not present

## 2019-02-02 DIAGNOSIS — I110 Hypertensive heart disease with heart failure: Secondary | ICD-10-CM | POA: Diagnosis not present

## 2019-02-02 DIAGNOSIS — E785 Hyperlipidemia, unspecified: Secondary | ICD-10-CM | POA: Diagnosis not present

## 2019-02-02 DIAGNOSIS — J449 Chronic obstructive pulmonary disease, unspecified: Secondary | ICD-10-CM | POA: Diagnosis not present

## 2019-02-02 DIAGNOSIS — J9621 Acute and chronic respiratory failure with hypoxia: Secondary | ICD-10-CM | POA: Diagnosis not present

## 2019-02-02 DIAGNOSIS — E119 Type 2 diabetes mellitus without complications: Secondary | ICD-10-CM | POA: Diagnosis not present

## 2019-02-06 DIAGNOSIS — I110 Hypertensive heart disease with heart failure: Secondary | ICD-10-CM | POA: Diagnosis not present

## 2019-02-06 DIAGNOSIS — J9621 Acute and chronic respiratory failure with hypoxia: Secondary | ICD-10-CM | POA: Diagnosis not present

## 2019-02-06 DIAGNOSIS — G8929 Other chronic pain: Secondary | ICD-10-CM | POA: Diagnosis not present

## 2019-02-06 DIAGNOSIS — E785 Hyperlipidemia, unspecified: Secondary | ICD-10-CM | POA: Diagnosis not present

## 2019-02-06 DIAGNOSIS — I5020 Unspecified systolic (congestive) heart failure: Secondary | ICD-10-CM | POA: Diagnosis not present

## 2019-02-06 DIAGNOSIS — J449 Chronic obstructive pulmonary disease, unspecified: Secondary | ICD-10-CM | POA: Diagnosis not present

## 2019-02-06 DIAGNOSIS — I5033 Acute on chronic diastolic (congestive) heart failure: Secondary | ICD-10-CM | POA: Diagnosis not present

## 2019-02-06 DIAGNOSIS — D649 Anemia, unspecified: Secondary | ICD-10-CM | POA: Diagnosis not present

## 2019-02-06 DIAGNOSIS — E119 Type 2 diabetes mellitus without complications: Secondary | ICD-10-CM | POA: Diagnosis not present

## 2019-02-07 DIAGNOSIS — E119 Type 2 diabetes mellitus without complications: Secondary | ICD-10-CM | POA: Diagnosis not present

## 2019-02-07 DIAGNOSIS — D649 Anemia, unspecified: Secondary | ICD-10-CM | POA: Diagnosis not present

## 2019-02-07 DIAGNOSIS — J449 Chronic obstructive pulmonary disease, unspecified: Secondary | ICD-10-CM | POA: Diagnosis not present

## 2019-02-07 DIAGNOSIS — J9621 Acute and chronic respiratory failure with hypoxia: Secondary | ICD-10-CM | POA: Diagnosis not present

## 2019-02-07 DIAGNOSIS — G8929 Other chronic pain: Secondary | ICD-10-CM | POA: Diagnosis not present

## 2019-02-07 DIAGNOSIS — E785 Hyperlipidemia, unspecified: Secondary | ICD-10-CM | POA: Diagnosis not present

## 2019-02-07 DIAGNOSIS — I110 Hypertensive heart disease with heart failure: Secondary | ICD-10-CM | POA: Diagnosis not present

## 2019-02-07 DIAGNOSIS — I5033 Acute on chronic diastolic (congestive) heart failure: Secondary | ICD-10-CM | POA: Diagnosis not present

## 2019-02-07 DIAGNOSIS — I5020 Unspecified systolic (congestive) heart failure: Secondary | ICD-10-CM | POA: Diagnosis not present

## 2019-02-08 DIAGNOSIS — E119 Type 2 diabetes mellitus without complications: Secondary | ICD-10-CM | POA: Diagnosis not present

## 2019-02-08 DIAGNOSIS — G8929 Other chronic pain: Secondary | ICD-10-CM | POA: Diagnosis not present

## 2019-02-08 DIAGNOSIS — I5020 Unspecified systolic (congestive) heart failure: Secondary | ICD-10-CM | POA: Diagnosis not present

## 2019-02-08 DIAGNOSIS — E785 Hyperlipidemia, unspecified: Secondary | ICD-10-CM | POA: Diagnosis not present

## 2019-02-08 DIAGNOSIS — I5033 Acute on chronic diastolic (congestive) heart failure: Secondary | ICD-10-CM | POA: Diagnosis not present

## 2019-02-08 DIAGNOSIS — J9621 Acute and chronic respiratory failure with hypoxia: Secondary | ICD-10-CM | POA: Diagnosis not present

## 2019-02-08 DIAGNOSIS — I110 Hypertensive heart disease with heart failure: Secondary | ICD-10-CM | POA: Diagnosis not present

## 2019-02-08 DIAGNOSIS — J449 Chronic obstructive pulmonary disease, unspecified: Secondary | ICD-10-CM | POA: Diagnosis not present

## 2019-02-08 DIAGNOSIS — D649 Anemia, unspecified: Secondary | ICD-10-CM | POA: Diagnosis not present

## 2019-02-09 DIAGNOSIS — J9621 Acute and chronic respiratory failure with hypoxia: Secondary | ICD-10-CM | POA: Diagnosis not present

## 2019-02-09 DIAGNOSIS — I5033 Acute on chronic diastolic (congestive) heart failure: Secondary | ICD-10-CM | POA: Diagnosis not present

## 2019-02-09 DIAGNOSIS — I110 Hypertensive heart disease with heart failure: Secondary | ICD-10-CM | POA: Diagnosis not present

## 2019-02-09 DIAGNOSIS — J449 Chronic obstructive pulmonary disease, unspecified: Secondary | ICD-10-CM | POA: Diagnosis not present

## 2019-02-09 DIAGNOSIS — E119 Type 2 diabetes mellitus without complications: Secondary | ICD-10-CM | POA: Diagnosis not present

## 2019-02-09 DIAGNOSIS — D649 Anemia, unspecified: Secondary | ICD-10-CM | POA: Diagnosis not present

## 2019-02-09 DIAGNOSIS — G8929 Other chronic pain: Secondary | ICD-10-CM | POA: Diagnosis not present

## 2019-02-09 DIAGNOSIS — J438 Other emphysema: Secondary | ICD-10-CM | POA: Diagnosis not present

## 2019-02-09 DIAGNOSIS — E785 Hyperlipidemia, unspecified: Secondary | ICD-10-CM | POA: Diagnosis not present

## 2019-02-09 DIAGNOSIS — I5020 Unspecified systolic (congestive) heart failure: Secondary | ICD-10-CM | POA: Diagnosis not present

## 2019-02-11 DIAGNOSIS — R062 Wheezing: Secondary | ICD-10-CM | POA: Diagnosis not present

## 2019-02-11 DIAGNOSIS — R0602 Shortness of breath: Secondary | ICD-10-CM | POA: Diagnosis not present

## 2019-02-11 DIAGNOSIS — J449 Chronic obstructive pulmonary disease, unspecified: Secondary | ICD-10-CM | POA: Diagnosis not present

## 2019-02-11 DIAGNOSIS — J45909 Unspecified asthma, uncomplicated: Secondary | ICD-10-CM | POA: Diagnosis not present

## 2019-02-13 DIAGNOSIS — I5033 Acute on chronic diastolic (congestive) heart failure: Secondary | ICD-10-CM | POA: Diagnosis not present

## 2019-02-13 DIAGNOSIS — J449 Chronic obstructive pulmonary disease, unspecified: Secondary | ICD-10-CM | POA: Diagnosis not present

## 2019-02-13 DIAGNOSIS — G8929 Other chronic pain: Secondary | ICD-10-CM | POA: Diagnosis not present

## 2019-02-13 DIAGNOSIS — I5020 Unspecified systolic (congestive) heart failure: Secondary | ICD-10-CM | POA: Diagnosis not present

## 2019-02-13 DIAGNOSIS — J9621 Acute and chronic respiratory failure with hypoxia: Secondary | ICD-10-CM | POA: Diagnosis not present

## 2019-02-13 DIAGNOSIS — D649 Anemia, unspecified: Secondary | ICD-10-CM | POA: Diagnosis not present

## 2019-02-13 DIAGNOSIS — I110 Hypertensive heart disease with heart failure: Secondary | ICD-10-CM | POA: Diagnosis not present

## 2019-02-13 DIAGNOSIS — E119 Type 2 diabetes mellitus without complications: Secondary | ICD-10-CM | POA: Diagnosis not present

## 2019-02-13 DIAGNOSIS — E785 Hyperlipidemia, unspecified: Secondary | ICD-10-CM | POA: Diagnosis not present

## 2019-02-14 DIAGNOSIS — I110 Hypertensive heart disease with heart failure: Secondary | ICD-10-CM | POA: Diagnosis not present

## 2019-02-14 DIAGNOSIS — I5033 Acute on chronic diastolic (congestive) heart failure: Secondary | ICD-10-CM | POA: Diagnosis not present

## 2019-02-14 DIAGNOSIS — J9621 Acute and chronic respiratory failure with hypoxia: Secondary | ICD-10-CM | POA: Diagnosis not present

## 2019-02-14 DIAGNOSIS — E119 Type 2 diabetes mellitus without complications: Secondary | ICD-10-CM | POA: Diagnosis not present

## 2019-02-14 DIAGNOSIS — D649 Anemia, unspecified: Secondary | ICD-10-CM | POA: Diagnosis not present

## 2019-02-14 DIAGNOSIS — I5020 Unspecified systolic (congestive) heart failure: Secondary | ICD-10-CM | POA: Diagnosis not present

## 2019-02-14 DIAGNOSIS — G8929 Other chronic pain: Secondary | ICD-10-CM | POA: Diagnosis not present

## 2019-02-14 DIAGNOSIS — E785 Hyperlipidemia, unspecified: Secondary | ICD-10-CM | POA: Diagnosis not present

## 2019-02-14 DIAGNOSIS — J449 Chronic obstructive pulmonary disease, unspecified: Secondary | ICD-10-CM | POA: Diagnosis not present

## 2019-02-15 DIAGNOSIS — I5033 Acute on chronic diastolic (congestive) heart failure: Secondary | ICD-10-CM | POA: Diagnosis not present

## 2019-02-15 DIAGNOSIS — E119 Type 2 diabetes mellitus without complications: Secondary | ICD-10-CM | POA: Diagnosis not present

## 2019-02-15 DIAGNOSIS — E785 Hyperlipidemia, unspecified: Secondary | ICD-10-CM | POA: Diagnosis not present

## 2019-02-15 DIAGNOSIS — G8929 Other chronic pain: Secondary | ICD-10-CM | POA: Diagnosis not present

## 2019-02-15 DIAGNOSIS — I5020 Unspecified systolic (congestive) heart failure: Secondary | ICD-10-CM | POA: Diagnosis not present

## 2019-02-15 DIAGNOSIS — J449 Chronic obstructive pulmonary disease, unspecified: Secondary | ICD-10-CM | POA: Diagnosis not present

## 2019-02-15 DIAGNOSIS — I110 Hypertensive heart disease with heart failure: Secondary | ICD-10-CM | POA: Diagnosis not present

## 2019-02-15 DIAGNOSIS — D649 Anemia, unspecified: Secondary | ICD-10-CM | POA: Diagnosis not present

## 2019-02-15 DIAGNOSIS — J9621 Acute and chronic respiratory failure with hypoxia: Secondary | ICD-10-CM | POA: Diagnosis not present

## 2019-02-16 DIAGNOSIS — E119 Type 2 diabetes mellitus without complications: Secondary | ICD-10-CM | POA: Diagnosis not present

## 2019-02-16 DIAGNOSIS — I110 Hypertensive heart disease with heart failure: Secondary | ICD-10-CM | POA: Diagnosis not present

## 2019-02-16 DIAGNOSIS — I5020 Unspecified systolic (congestive) heart failure: Secondary | ICD-10-CM | POA: Diagnosis not present

## 2019-02-16 DIAGNOSIS — G8929 Other chronic pain: Secondary | ICD-10-CM | POA: Diagnosis not present

## 2019-02-16 DIAGNOSIS — J9621 Acute and chronic respiratory failure with hypoxia: Secondary | ICD-10-CM | POA: Diagnosis not present

## 2019-02-16 DIAGNOSIS — I5033 Acute on chronic diastolic (congestive) heart failure: Secondary | ICD-10-CM | POA: Diagnosis not present

## 2019-02-16 DIAGNOSIS — E785 Hyperlipidemia, unspecified: Secondary | ICD-10-CM | POA: Diagnosis not present

## 2019-02-16 DIAGNOSIS — D649 Anemia, unspecified: Secondary | ICD-10-CM | POA: Diagnosis not present

## 2019-02-16 DIAGNOSIS — J449 Chronic obstructive pulmonary disease, unspecified: Secondary | ICD-10-CM | POA: Diagnosis not present

## 2019-02-20 DIAGNOSIS — J449 Chronic obstructive pulmonary disease, unspecified: Secondary | ICD-10-CM | POA: Diagnosis not present

## 2019-02-21 DIAGNOSIS — J449 Chronic obstructive pulmonary disease, unspecified: Secondary | ICD-10-CM | POA: Diagnosis not present

## 2019-02-21 DIAGNOSIS — I5033 Acute on chronic diastolic (congestive) heart failure: Secondary | ICD-10-CM | POA: Diagnosis not present

## 2019-02-21 DIAGNOSIS — G8929 Other chronic pain: Secondary | ICD-10-CM | POA: Diagnosis not present

## 2019-02-21 DIAGNOSIS — J9621 Acute and chronic respiratory failure with hypoxia: Secondary | ICD-10-CM | POA: Diagnosis not present

## 2019-02-21 DIAGNOSIS — E119 Type 2 diabetes mellitus without complications: Secondary | ICD-10-CM | POA: Diagnosis not present

## 2019-02-21 DIAGNOSIS — E785 Hyperlipidemia, unspecified: Secondary | ICD-10-CM | POA: Diagnosis not present

## 2019-02-21 DIAGNOSIS — D649 Anemia, unspecified: Secondary | ICD-10-CM | POA: Diagnosis not present

## 2019-02-21 DIAGNOSIS — I110 Hypertensive heart disease with heart failure: Secondary | ICD-10-CM | POA: Diagnosis not present

## 2019-02-21 DIAGNOSIS — I5020 Unspecified systolic (congestive) heart failure: Secondary | ICD-10-CM | POA: Diagnosis not present

## 2019-02-22 DIAGNOSIS — N183 Chronic kidney disease, stage 3 (moderate): Secondary | ICD-10-CM | POA: Diagnosis not present

## 2019-02-22 DIAGNOSIS — E876 Hypokalemia: Secondary | ICD-10-CM | POA: Diagnosis not present

## 2019-02-22 DIAGNOSIS — E1122 Type 2 diabetes mellitus with diabetic chronic kidney disease: Secondary | ICD-10-CM | POA: Diagnosis not present

## 2019-02-22 DIAGNOSIS — I129 Hypertensive chronic kidney disease with stage 1 through stage 4 chronic kidney disease, or unspecified chronic kidney disease: Secondary | ICD-10-CM | POA: Diagnosis not present

## 2019-02-23 DIAGNOSIS — G8929 Other chronic pain: Secondary | ICD-10-CM | POA: Diagnosis not present

## 2019-02-23 DIAGNOSIS — J9621 Acute and chronic respiratory failure with hypoxia: Secondary | ICD-10-CM | POA: Diagnosis not present

## 2019-02-23 DIAGNOSIS — I110 Hypertensive heart disease with heart failure: Secondary | ICD-10-CM | POA: Diagnosis not present

## 2019-02-23 DIAGNOSIS — E785 Hyperlipidemia, unspecified: Secondary | ICD-10-CM | POA: Diagnosis not present

## 2019-02-23 DIAGNOSIS — I5033 Acute on chronic diastolic (congestive) heart failure: Secondary | ICD-10-CM | POA: Diagnosis not present

## 2019-02-23 DIAGNOSIS — D649 Anemia, unspecified: Secondary | ICD-10-CM | POA: Diagnosis not present

## 2019-02-23 DIAGNOSIS — J449 Chronic obstructive pulmonary disease, unspecified: Secondary | ICD-10-CM | POA: Diagnosis not present

## 2019-02-23 DIAGNOSIS — I5020 Unspecified systolic (congestive) heart failure: Secondary | ICD-10-CM | POA: Diagnosis not present

## 2019-02-23 DIAGNOSIS — E119 Type 2 diabetes mellitus without complications: Secondary | ICD-10-CM | POA: Diagnosis not present

## 2019-03-01 DIAGNOSIS — I5033 Acute on chronic diastolic (congestive) heart failure: Secondary | ICD-10-CM | POA: Diagnosis not present

## 2019-03-01 DIAGNOSIS — D649 Anemia, unspecified: Secondary | ICD-10-CM | POA: Diagnosis not present

## 2019-03-01 DIAGNOSIS — E119 Type 2 diabetes mellitus without complications: Secondary | ICD-10-CM | POA: Diagnosis not present

## 2019-03-01 DIAGNOSIS — G8929 Other chronic pain: Secondary | ICD-10-CM | POA: Diagnosis not present

## 2019-03-01 DIAGNOSIS — I5020 Unspecified systolic (congestive) heart failure: Secondary | ICD-10-CM | POA: Diagnosis not present

## 2019-03-01 DIAGNOSIS — E785 Hyperlipidemia, unspecified: Secondary | ICD-10-CM | POA: Diagnosis not present

## 2019-03-01 DIAGNOSIS — J9621 Acute and chronic respiratory failure with hypoxia: Secondary | ICD-10-CM | POA: Diagnosis not present

## 2019-03-01 DIAGNOSIS — J449 Chronic obstructive pulmonary disease, unspecified: Secondary | ICD-10-CM | POA: Diagnosis not present

## 2019-03-01 DIAGNOSIS — I110 Hypertensive heart disease with heart failure: Secondary | ICD-10-CM | POA: Diagnosis not present

## 2019-03-07 DIAGNOSIS — I5033 Acute on chronic diastolic (congestive) heart failure: Secondary | ICD-10-CM | POA: Diagnosis not present

## 2019-03-07 DIAGNOSIS — G8929 Other chronic pain: Secondary | ICD-10-CM | POA: Diagnosis not present

## 2019-03-07 DIAGNOSIS — E785 Hyperlipidemia, unspecified: Secondary | ICD-10-CM | POA: Diagnosis not present

## 2019-03-07 DIAGNOSIS — J9621 Acute and chronic respiratory failure with hypoxia: Secondary | ICD-10-CM | POA: Diagnosis not present

## 2019-03-07 DIAGNOSIS — I110 Hypertensive heart disease with heart failure: Secondary | ICD-10-CM | POA: Diagnosis not present

## 2019-03-07 DIAGNOSIS — D649 Anemia, unspecified: Secondary | ICD-10-CM | POA: Diagnosis not present

## 2019-03-07 DIAGNOSIS — E119 Type 2 diabetes mellitus without complications: Secondary | ICD-10-CM | POA: Diagnosis not present

## 2019-03-07 DIAGNOSIS — J449 Chronic obstructive pulmonary disease, unspecified: Secondary | ICD-10-CM | POA: Diagnosis not present

## 2019-03-07 DIAGNOSIS — I5020 Unspecified systolic (congestive) heart failure: Secondary | ICD-10-CM | POA: Diagnosis not present

## 2019-03-10 DIAGNOSIS — J449 Chronic obstructive pulmonary disease, unspecified: Secondary | ICD-10-CM | POA: Diagnosis not present

## 2019-03-10 DIAGNOSIS — J438 Other emphysema: Secondary | ICD-10-CM | POA: Diagnosis not present

## 2019-03-12 DIAGNOSIS — J449 Chronic obstructive pulmonary disease, unspecified: Secondary | ICD-10-CM | POA: Diagnosis not present

## 2019-03-12 DIAGNOSIS — R062 Wheezing: Secondary | ICD-10-CM | POA: Diagnosis not present

## 2019-03-12 DIAGNOSIS — J45909 Unspecified asthma, uncomplicated: Secondary | ICD-10-CM | POA: Diagnosis not present

## 2019-03-21 DIAGNOSIS — J449 Chronic obstructive pulmonary disease, unspecified: Secondary | ICD-10-CM | POA: Diagnosis not present

## 2019-03-27 DIAGNOSIS — F1120 Opioid dependence, uncomplicated: Secondary | ICD-10-CM | POA: Diagnosis not present

## 2019-03-27 DIAGNOSIS — M5416 Radiculopathy, lumbar region: Secondary | ICD-10-CM | POA: Diagnosis not present

## 2019-03-27 DIAGNOSIS — Z7151 Drug abuse counseling and surveillance of drug abuser: Secondary | ICD-10-CM | POA: Diagnosis not present

## 2019-03-27 DIAGNOSIS — Z729 Problem related to lifestyle, unspecified: Secondary | ICD-10-CM | POA: Diagnosis not present

## 2019-03-27 DIAGNOSIS — F17290 Nicotine dependence, other tobacco product, uncomplicated: Secondary | ICD-10-CM | POA: Diagnosis not present

## 2019-03-27 DIAGNOSIS — G894 Chronic pain syndrome: Secondary | ICD-10-CM | POA: Diagnosis not present

## 2019-03-27 DIAGNOSIS — M5430 Sciatica, unspecified side: Secondary | ICD-10-CM | POA: Diagnosis not present

## 2019-03-27 DIAGNOSIS — E119 Type 2 diabetes mellitus without complications: Secondary | ICD-10-CM | POA: Diagnosis not present

## 2019-04-10 DIAGNOSIS — J438 Other emphysema: Secondary | ICD-10-CM | POA: Diagnosis not present

## 2019-04-10 DIAGNOSIS — J449 Chronic obstructive pulmonary disease, unspecified: Secondary | ICD-10-CM | POA: Diagnosis not present

## 2019-04-12 DIAGNOSIS — R062 Wheezing: Secondary | ICD-10-CM | POA: Diagnosis not present

## 2019-04-12 DIAGNOSIS — J449 Chronic obstructive pulmonary disease, unspecified: Secondary | ICD-10-CM | POA: Diagnosis not present

## 2019-04-12 DIAGNOSIS — J45909 Unspecified asthma, uncomplicated: Secondary | ICD-10-CM | POA: Diagnosis not present

## 2019-04-14 DIAGNOSIS — J439 Emphysema, unspecified: Secondary | ICD-10-CM | POA: Diagnosis not present

## 2019-04-14 DIAGNOSIS — E7800 Pure hypercholesterolemia, unspecified: Secondary | ICD-10-CM | POA: Diagnosis not present

## 2019-04-14 DIAGNOSIS — E119 Type 2 diabetes mellitus without complications: Secondary | ICD-10-CM | POA: Diagnosis not present

## 2019-04-14 DIAGNOSIS — K219 Gastro-esophageal reflux disease without esophagitis: Secondary | ICD-10-CM | POA: Diagnosis not present

## 2019-04-14 DIAGNOSIS — I10 Essential (primary) hypertension: Secondary | ICD-10-CM | POA: Diagnosis not present

## 2019-04-21 DIAGNOSIS — J449 Chronic obstructive pulmonary disease, unspecified: Secondary | ICD-10-CM | POA: Diagnosis not present

## 2019-04-24 DIAGNOSIS — F1120 Opioid dependence, uncomplicated: Secondary | ICD-10-CM | POA: Diagnosis not present

## 2019-04-24 DIAGNOSIS — Z729 Problem related to lifestyle, unspecified: Secondary | ICD-10-CM | POA: Diagnosis not present

## 2019-04-24 DIAGNOSIS — M5430 Sciatica, unspecified side: Secondary | ICD-10-CM | POA: Diagnosis not present

## 2019-04-24 DIAGNOSIS — Z79899 Other long term (current) drug therapy: Secondary | ICD-10-CM | POA: Diagnosis not present

## 2019-04-24 DIAGNOSIS — G894 Chronic pain syndrome: Secondary | ICD-10-CM | POA: Diagnosis not present

## 2019-04-24 DIAGNOSIS — Z7151 Drug abuse counseling and surveillance of drug abuser: Secondary | ICD-10-CM | POA: Diagnosis not present

## 2019-04-24 DIAGNOSIS — M5416 Radiculopathy, lumbar region: Secondary | ICD-10-CM | POA: Diagnosis not present

## 2019-04-24 DIAGNOSIS — E119 Type 2 diabetes mellitus without complications: Secondary | ICD-10-CM | POA: Diagnosis not present

## 2019-04-24 DIAGNOSIS — F17290 Nicotine dependence, other tobacco product, uncomplicated: Secondary | ICD-10-CM | POA: Diagnosis not present

## 2019-05-12 DIAGNOSIS — J449 Chronic obstructive pulmonary disease, unspecified: Secondary | ICD-10-CM | POA: Diagnosis not present

## 2019-05-12 DIAGNOSIS — R062 Wheezing: Secondary | ICD-10-CM | POA: Diagnosis not present

## 2019-05-12 DIAGNOSIS — J45909 Unspecified asthma, uncomplicated: Secondary | ICD-10-CM | POA: Diagnosis not present

## 2019-05-29 DIAGNOSIS — E119 Type 2 diabetes mellitus without complications: Secondary | ICD-10-CM | POA: Diagnosis not present

## 2019-05-29 DIAGNOSIS — M5416 Radiculopathy, lumbar region: Secondary | ICD-10-CM | POA: Diagnosis not present

## 2019-05-29 DIAGNOSIS — Z7151 Drug abuse counseling and surveillance of drug abuser: Secondary | ICD-10-CM | POA: Diagnosis not present

## 2019-05-29 DIAGNOSIS — G894 Chronic pain syndrome: Secondary | ICD-10-CM | POA: Diagnosis not present

## 2019-05-29 DIAGNOSIS — Z729 Problem related to lifestyle, unspecified: Secondary | ICD-10-CM | POA: Diagnosis not present

## 2019-05-29 DIAGNOSIS — F1120 Opioid dependence, uncomplicated: Secondary | ICD-10-CM | POA: Diagnosis not present

## 2019-05-29 DIAGNOSIS — F17290 Nicotine dependence, other tobacco product, uncomplicated: Secondary | ICD-10-CM | POA: Diagnosis not present

## 2019-05-29 DIAGNOSIS — M5430 Sciatica, unspecified side: Secondary | ICD-10-CM | POA: Diagnosis not present

## 2019-06-12 DIAGNOSIS — J449 Chronic obstructive pulmonary disease, unspecified: Secondary | ICD-10-CM | POA: Diagnosis not present

## 2019-06-12 DIAGNOSIS — R062 Wheezing: Secondary | ICD-10-CM | POA: Diagnosis not present

## 2019-06-12 DIAGNOSIS — J45909 Unspecified asthma, uncomplicated: Secondary | ICD-10-CM | POA: Diagnosis not present

## 2019-06-14 DIAGNOSIS — E119 Type 2 diabetes mellitus without complications: Secondary | ICD-10-CM | POA: Diagnosis not present

## 2019-06-14 DIAGNOSIS — K219 Gastro-esophageal reflux disease without esophagitis: Secondary | ICD-10-CM | POA: Diagnosis not present

## 2019-06-14 DIAGNOSIS — J439 Emphysema, unspecified: Secondary | ICD-10-CM | POA: Diagnosis not present

## 2019-06-14 DIAGNOSIS — Z0000 Encounter for general adult medical examination without abnormal findings: Secondary | ICD-10-CM | POA: Diagnosis not present

## 2019-06-14 DIAGNOSIS — J9611 Chronic respiratory failure with hypoxia: Secondary | ICD-10-CM | POA: Diagnosis not present

## 2019-06-14 DIAGNOSIS — I10 Essential (primary) hypertension: Secondary | ICD-10-CM | POA: Diagnosis not present

## 2019-06-14 DIAGNOSIS — G4733 Obstructive sleep apnea (adult) (pediatric): Secondary | ICD-10-CM | POA: Diagnosis not present

## 2019-06-14 DIAGNOSIS — E1151 Type 2 diabetes mellitus with diabetic peripheral angiopathy without gangrene: Secondary | ICD-10-CM | POA: Diagnosis not present

## 2019-06-14 DIAGNOSIS — E7800 Pure hypercholesterolemia, unspecified: Secondary | ICD-10-CM | POA: Diagnosis not present

## 2019-07-03 DIAGNOSIS — Z729 Problem related to lifestyle, unspecified: Secondary | ICD-10-CM | POA: Diagnosis not present

## 2019-07-03 DIAGNOSIS — R5383 Other fatigue: Secondary | ICD-10-CM | POA: Diagnosis not present

## 2019-07-03 DIAGNOSIS — M5430 Sciatica, unspecified side: Secondary | ICD-10-CM | POA: Diagnosis not present

## 2019-07-03 DIAGNOSIS — M5416 Radiculopathy, lumbar region: Secondary | ICD-10-CM | POA: Diagnosis not present

## 2019-07-03 DIAGNOSIS — Z7151 Drug abuse counseling and surveillance of drug abuser: Secondary | ICD-10-CM | POA: Diagnosis not present

## 2019-07-03 DIAGNOSIS — F1120 Opioid dependence, uncomplicated: Secondary | ICD-10-CM | POA: Diagnosis not present

## 2019-07-03 DIAGNOSIS — Z79899 Other long term (current) drug therapy: Secondary | ICD-10-CM | POA: Diagnosis not present

## 2019-07-03 DIAGNOSIS — G894 Chronic pain syndrome: Secondary | ICD-10-CM | POA: Diagnosis not present

## 2019-07-03 DIAGNOSIS — E119 Type 2 diabetes mellitus without complications: Secondary | ICD-10-CM | POA: Diagnosis not present

## 2019-07-03 DIAGNOSIS — F17290 Nicotine dependence, other tobacco product, uncomplicated: Secondary | ICD-10-CM | POA: Diagnosis not present

## 2019-07-12 DIAGNOSIS — J45909 Unspecified asthma, uncomplicated: Secondary | ICD-10-CM | POA: Diagnosis not present

## 2019-07-12 DIAGNOSIS — J449 Chronic obstructive pulmonary disease, unspecified: Secondary | ICD-10-CM | POA: Diagnosis not present

## 2019-07-12 DIAGNOSIS — R062 Wheezing: Secondary | ICD-10-CM | POA: Diagnosis not present

## 2019-07-17 DIAGNOSIS — R300 Dysuria: Secondary | ICD-10-CM | POA: Diagnosis not present

## 2019-07-17 DIAGNOSIS — R509 Fever, unspecified: Secondary | ICD-10-CM | POA: Diagnosis not present

## 2019-07-19 ENCOUNTER — Ambulatory Visit
Admit: 2019-07-19 | Discharge: 2019-07-27 | Disposition: A | Discharge status: Home Health | Payer: MEDICARE | Admitting: Internal Medicine

## 2019-07-19 DIAGNOSIS — I361 Nonrheumatic tricuspid (valve) insufficiency: Secondary | ICD-10-CM | POA: Diagnosis not present

## 2019-07-19 DIAGNOSIS — I248 Other forms of acute ischemic heart disease: Secondary | ICD-10-CM | POA: Diagnosis not present

## 2019-07-19 DIAGNOSIS — I491 Atrial premature depolarization: Secondary | ICD-10-CM | POA: Diagnosis not present

## 2019-07-19 DIAGNOSIS — N179 Acute kidney failure, unspecified: Secondary | ICD-10-CM | POA: Diagnosis not present

## 2019-07-19 DIAGNOSIS — E119 Type 2 diabetes mellitus without complications: Secondary | ICD-10-CM | POA: Diagnosis not present

## 2019-07-19 DIAGNOSIS — R7989 Other specified abnormal findings of blood chemistry: Secondary | ICD-10-CM | POA: Diagnosis not present

## 2019-07-19 DIAGNOSIS — R410 Disorientation, unspecified: Secondary | ICD-10-CM | POA: Diagnosis not present

## 2019-07-19 DIAGNOSIS — I21A1 Myocardial infarction type 2: Secondary | ICD-10-CM | POA: Diagnosis not present

## 2019-07-19 DIAGNOSIS — R0602 Shortness of breath: Secondary | ICD-10-CM | POA: Diagnosis not present

## 2019-07-19 DIAGNOSIS — Z20828 Contact with and (suspected) exposure to other viral communicable diseases: Secondary | ICD-10-CM | POA: Diagnosis not present

## 2019-07-19 DIAGNOSIS — J9612 Chronic respiratory failure with hypercapnia: Secondary | ICD-10-CM | POA: Diagnosis not present

## 2019-07-19 DIAGNOSIS — I272 Pulmonary hypertension, unspecified: Secondary | ICD-10-CM | POA: Diagnosis not present

## 2019-07-19 DIAGNOSIS — R531 Weakness: Secondary | ICD-10-CM | POA: Diagnosis not present

## 2019-07-19 DIAGNOSIS — N170 Acute kidney failure with tubular necrosis: Secondary | ICD-10-CM | POA: Diagnosis not present

## 2019-07-19 DIAGNOSIS — J441 Chronic obstructive pulmonary disease with (acute) exacerbation: Secondary | ICD-10-CM | POA: Diagnosis not present

## 2019-07-19 DIAGNOSIS — A419 Sepsis, unspecified organism: Secondary | ICD-10-CM | POA: Diagnosis not present

## 2019-07-19 DIAGNOSIS — J189 Pneumonia, unspecified organism: Secondary | ICD-10-CM | POA: Diagnosis not present

## 2019-07-19 DIAGNOSIS — Z794 Long term (current) use of insulin: Secondary | ICD-10-CM | POA: Diagnosis not present

## 2019-07-19 DIAGNOSIS — I493 Ventricular premature depolarization: Secondary | ICD-10-CM | POA: Diagnosis not present

## 2019-07-19 DIAGNOSIS — R0902 Hypoxemia: Secondary | ICD-10-CM | POA: Diagnosis not present

## 2019-07-19 DIAGNOSIS — R918 Other nonspecific abnormal finding of lung field: Secondary | ICD-10-CM | POA: Diagnosis not present

## 2019-07-19 DIAGNOSIS — R6520 Severe sepsis without septic shock: Secondary | ICD-10-CM | POA: Diagnosis not present

## 2019-07-19 DIAGNOSIS — I249 Acute ischemic heart disease, unspecified: Secondary | ICD-10-CM | POA: Diagnosis not present

## 2019-07-19 DIAGNOSIS — I10 Essential (primary) hypertension: Secondary | ICD-10-CM | POA: Diagnosis not present

## 2019-07-19 DIAGNOSIS — J449 Chronic obstructive pulmonary disease, unspecified: Secondary | ICD-10-CM | POA: Diagnosis not present

## 2019-07-19 DIAGNOSIS — I348 Other nonrheumatic mitral valve disorders: Secondary | ICD-10-CM | POA: Diagnosis not present

## 2019-07-19 DIAGNOSIS — I517 Cardiomegaly: Secondary | ICD-10-CM | POA: Diagnosis not present

## 2019-07-19 DIAGNOSIS — J440 Chronic obstructive pulmonary disease with acute lower respiratory infection: Secondary | ICD-10-CM | POA: Diagnosis not present

## 2019-07-19 DIAGNOSIS — R269 Unspecified abnormalities of gait and mobility: Secondary | ICD-10-CM | POA: Diagnosis not present

## 2019-07-19 DIAGNOSIS — E875 Hyperkalemia: Secondary | ICD-10-CM | POA: Diagnosis not present

## 2019-07-19 DIAGNOSIS — I4891 Unspecified atrial fibrillation: Secondary | ICD-10-CM | POA: Diagnosis not present

## 2019-07-19 DIAGNOSIS — J9610 Chronic respiratory failure, unspecified whether with hypoxia or hypercapnia: Secondary | ICD-10-CM | POA: Diagnosis not present

## 2019-07-19 DIAGNOSIS — R509 Fever, unspecified: Secondary | ICD-10-CM | POA: Diagnosis not present

## 2019-07-20 DIAGNOSIS — J189 Pneumonia, unspecified organism: Principal | ICD-10-CM

## 2019-07-27 DIAGNOSIS — I272 Pulmonary hypertension, unspecified: Secondary | ICD-10-CM

## 2019-07-27 DIAGNOSIS — G4733 Obstructive sleep apnea (adult) (pediatric): Secondary | ICD-10-CM

## 2019-07-27 DIAGNOSIS — R3914 Feeling of incomplete bladder emptying: Secondary | ICD-10-CM

## 2019-07-27 DIAGNOSIS — G8929 Other chronic pain: Secondary | ICD-10-CM

## 2019-07-27 DIAGNOSIS — I129 Hypertensive chronic kidney disease with stage 1 through stage 4 chronic kidney disease, or unspecified chronic kidney disease: Secondary | ICD-10-CM

## 2019-07-27 DIAGNOSIS — Z7951 Long term (current) use of inhaled steroids: Secondary | ICD-10-CM

## 2019-07-27 DIAGNOSIS — Z7984 Long term (current) use of oral hypoglycemic drugs: Secondary | ICD-10-CM

## 2019-07-27 DIAGNOSIS — J9611 Chronic respiratory failure with hypoxia: Secondary | ICD-10-CM

## 2019-07-27 DIAGNOSIS — Z87891 Personal history of nicotine dependence: Secondary | ICD-10-CM

## 2019-07-27 DIAGNOSIS — M47817 Spondylosis without myelopathy or radiculopathy, lumbosacral region: Secondary | ICD-10-CM

## 2019-07-27 DIAGNOSIS — E1122 Type 2 diabetes mellitus with diabetic chronic kidney disease: Secondary | ICD-10-CM

## 2019-07-27 DIAGNOSIS — Z7982 Long term (current) use of aspirin: Secondary | ICD-10-CM

## 2019-07-27 DIAGNOSIS — J432 Centrilobular emphysema: Principal | ICD-10-CM

## 2019-07-27 DIAGNOSIS — N183 Chronic kidney disease, stage 3 (moderate): Secondary | ICD-10-CM

## 2019-07-27 DIAGNOSIS — R339 Retention of urine, unspecified: Secondary | ICD-10-CM

## 2019-07-27 DIAGNOSIS — I200 Unstable angina: Secondary | ICD-10-CM

## 2019-07-27 DIAGNOSIS — M48061 Spinal stenosis, lumbar region without neurogenic claudication: Secondary | ICD-10-CM

## 2019-07-27 MED ORDER — ONDANSETRON 4 MG DISINTEGRATING TABLET
ORAL_TABLET | Freq: Three times a day (TID) | ORAL | 0 refills | 5 days | Status: CP | PRN
Start: 2019-07-27 — End: ?
  Filled 2019-07-27: qty 15, 5d supply, fill #0

## 2019-07-27 MED ORDER — FUROSEMIDE 20 MG TABLET
ORAL_TABLET | Freq: Every day | ORAL | 0 refills | 30 days
Start: 2019-07-27 — End: 2019-07-27

## 2019-07-27 MED ORDER — FUROSEMIDE 40 MG TABLET
ORAL_TABLET | Freq: Every day | ORAL | 0 refills | 30 days | Status: CP
Start: 2019-07-27 — End: 2019-08-26
  Filled 2019-07-27: qty 30, 30d supply, fill #0

## 2019-07-27 MED FILL — FUROSEMIDE 40 MG TABLET: 30 days supply | Qty: 30 | Fill #0 | Status: AC

## 2019-07-27 MED FILL — ONDANSETRON 4 MG DISINTEGRATING TABLET: 5 days supply | Qty: 15 | Fill #0 | Status: AC

## 2019-07-28 ENCOUNTER — Encounter: Admit: 2019-07-28 | Discharge: 2019-08-26 | Payer: MEDICARE

## 2019-07-28 ENCOUNTER — Inpatient Hospital Stay: Admit: 2019-07-28 | Discharge: 2019-08-26 | Payer: MEDICARE

## 2019-07-28 DIAGNOSIS — I200 Unstable angina: Secondary | ICD-10-CM | POA: Diagnosis not present

## 2019-07-28 DIAGNOSIS — I272 Pulmonary hypertension, unspecified: Secondary | ICD-10-CM | POA: Diagnosis not present

## 2019-07-28 DIAGNOSIS — E1122 Type 2 diabetes mellitus with diabetic chronic kidney disease: Secondary | ICD-10-CM | POA: Diagnosis not present

## 2019-07-28 DIAGNOSIS — N183 Chronic kidney disease, stage 3 (moderate): Secondary | ICD-10-CM | POA: Diagnosis not present

## 2019-07-28 DIAGNOSIS — I129 Hypertensive chronic kidney disease with stage 1 through stage 4 chronic kidney disease, or unspecified chronic kidney disease: Secondary | ICD-10-CM | POA: Diagnosis not present

## 2019-07-28 DIAGNOSIS — G4733 Obstructive sleep apnea (adult) (pediatric): Secondary | ICD-10-CM | POA: Diagnosis not present

## 2019-07-28 DIAGNOSIS — J9611 Chronic respiratory failure with hypoxia: Secondary | ICD-10-CM | POA: Diagnosis not present

## 2019-07-28 DIAGNOSIS — G8929 Other chronic pain: Secondary | ICD-10-CM | POA: Diagnosis not present

## 2019-07-28 DIAGNOSIS — J432 Centrilobular emphysema: Secondary | ICD-10-CM | POA: Diagnosis not present

## 2019-07-28 DIAGNOSIS — M47817 Spondylosis without myelopathy or radiculopathy, lumbosacral region: Secondary | ICD-10-CM

## 2019-07-28 DIAGNOSIS — Z7951 Long term (current) use of inhaled steroids: Secondary | ICD-10-CM

## 2019-07-28 DIAGNOSIS — M48061 Spinal stenosis, lumbar region without neurogenic claudication: Secondary | ICD-10-CM

## 2019-07-28 DIAGNOSIS — R339 Retention of urine, unspecified: Secondary | ICD-10-CM

## 2019-07-28 DIAGNOSIS — Z7984 Long term (current) use of oral hypoglycemic drugs: Secondary | ICD-10-CM

## 2019-07-28 DIAGNOSIS — Z7982 Long term (current) use of aspirin: Secondary | ICD-10-CM

## 2019-07-28 DIAGNOSIS — R3914 Feeling of incomplete bladder emptying: Secondary | ICD-10-CM

## 2019-07-28 DIAGNOSIS — Z87891 Personal history of nicotine dependence: Secondary | ICD-10-CM

## 2019-07-29 DIAGNOSIS — I272 Pulmonary hypertension, unspecified: Secondary | ICD-10-CM | POA: Diagnosis not present

## 2019-07-29 DIAGNOSIS — J9611 Chronic respiratory failure with hypoxia: Secondary | ICD-10-CM | POA: Diagnosis not present

## 2019-07-29 DIAGNOSIS — G4733 Obstructive sleep apnea (adult) (pediatric): Secondary | ICD-10-CM | POA: Diagnosis not present

## 2019-07-29 DIAGNOSIS — E1122 Type 2 diabetes mellitus with diabetic chronic kidney disease: Secondary | ICD-10-CM | POA: Diagnosis not present

## 2019-07-29 DIAGNOSIS — I200 Unstable angina: Secondary | ICD-10-CM | POA: Diagnosis not present

## 2019-07-29 DIAGNOSIS — J432 Centrilobular emphysema: Secondary | ICD-10-CM | POA: Diagnosis not present

## 2019-07-29 DIAGNOSIS — G8929 Other chronic pain: Secondary | ICD-10-CM | POA: Diagnosis not present

## 2019-07-29 DIAGNOSIS — N183 Chronic kidney disease, stage 3 (moderate): Secondary | ICD-10-CM | POA: Diagnosis not present

## 2019-07-29 DIAGNOSIS — I129 Hypertensive chronic kidney disease with stage 1 through stage 4 chronic kidney disease, or unspecified chronic kidney disease: Secondary | ICD-10-CM | POA: Diagnosis not present

## 2019-07-29 DIAGNOSIS — R339 Retention of urine, unspecified: Secondary | ICD-10-CM

## 2019-07-29 DIAGNOSIS — Z87891 Personal history of nicotine dependence: Secondary | ICD-10-CM

## 2019-07-29 DIAGNOSIS — M47817 Spondylosis without myelopathy or radiculopathy, lumbosacral region: Secondary | ICD-10-CM

## 2019-07-29 DIAGNOSIS — Z7951 Long term (current) use of inhaled steroids: Secondary | ICD-10-CM

## 2019-07-29 DIAGNOSIS — Z7982 Long term (current) use of aspirin: Secondary | ICD-10-CM

## 2019-07-29 DIAGNOSIS — Z7984 Long term (current) use of oral hypoglycemic drugs: Secondary | ICD-10-CM

## 2019-07-29 DIAGNOSIS — R3914 Feeling of incomplete bladder emptying: Secondary | ICD-10-CM

## 2019-07-29 DIAGNOSIS — M48061 Spinal stenosis, lumbar region without neurogenic claudication: Secondary | ICD-10-CM

## 2019-07-31 DIAGNOSIS — R339 Retention of urine, unspecified: Secondary | ICD-10-CM

## 2019-07-31 DIAGNOSIS — G8929 Other chronic pain: Secondary | ICD-10-CM

## 2019-07-31 DIAGNOSIS — N183 Chronic kidney disease, stage 3 (moderate): Secondary | ICD-10-CM

## 2019-07-31 DIAGNOSIS — J9611 Chronic respiratory failure with hypoxia: Secondary | ICD-10-CM

## 2019-07-31 DIAGNOSIS — R3914 Feeling of incomplete bladder emptying: Secondary | ICD-10-CM

## 2019-07-31 DIAGNOSIS — G4733 Obstructive sleep apnea (adult) (pediatric): Secondary | ICD-10-CM

## 2019-07-31 DIAGNOSIS — Z7951 Long term (current) use of inhaled steroids: Secondary | ICD-10-CM

## 2019-07-31 DIAGNOSIS — Z87891 Personal history of nicotine dependence: Secondary | ICD-10-CM

## 2019-07-31 DIAGNOSIS — M47817 Spondylosis without myelopathy or radiculopathy, lumbosacral region: Secondary | ICD-10-CM

## 2019-07-31 DIAGNOSIS — E1122 Type 2 diabetes mellitus with diabetic chronic kidney disease: Secondary | ICD-10-CM

## 2019-07-31 DIAGNOSIS — I272 Pulmonary hypertension, unspecified: Secondary | ICD-10-CM

## 2019-07-31 DIAGNOSIS — J432 Centrilobular emphysema: Principal | ICD-10-CM

## 2019-07-31 DIAGNOSIS — I200 Unstable angina: Secondary | ICD-10-CM

## 2019-07-31 DIAGNOSIS — M48061 Spinal stenosis, lumbar region without neurogenic claudication: Secondary | ICD-10-CM

## 2019-07-31 DIAGNOSIS — I129 Hypertensive chronic kidney disease with stage 1 through stage 4 chronic kidney disease, or unspecified chronic kidney disease: Secondary | ICD-10-CM

## 2019-07-31 DIAGNOSIS — Z7984 Long term (current) use of oral hypoglycemic drugs: Secondary | ICD-10-CM

## 2019-07-31 DIAGNOSIS — Z7982 Long term (current) use of aspirin: Secondary | ICD-10-CM

## 2019-08-01 DIAGNOSIS — E1122 Type 2 diabetes mellitus with diabetic chronic kidney disease: Secondary | ICD-10-CM | POA: Diagnosis not present

## 2019-08-01 DIAGNOSIS — I200 Unstable angina: Secondary | ICD-10-CM | POA: Diagnosis not present

## 2019-08-01 DIAGNOSIS — N183 Chronic kidney disease, stage 3 (moderate): Secondary | ICD-10-CM | POA: Diagnosis not present

## 2019-08-01 DIAGNOSIS — I129 Hypertensive chronic kidney disease with stage 1 through stage 4 chronic kidney disease, or unspecified chronic kidney disease: Secondary | ICD-10-CM | POA: Diagnosis not present

## 2019-08-01 DIAGNOSIS — J432 Centrilobular emphysema: Secondary | ICD-10-CM | POA: Diagnosis not present

## 2019-08-01 DIAGNOSIS — G8929 Other chronic pain: Secondary | ICD-10-CM | POA: Diagnosis not present

## 2019-08-01 DIAGNOSIS — I272 Pulmonary hypertension, unspecified: Secondary | ICD-10-CM | POA: Diagnosis not present

## 2019-08-01 DIAGNOSIS — G4733 Obstructive sleep apnea (adult) (pediatric): Secondary | ICD-10-CM | POA: Diagnosis not present

## 2019-08-01 DIAGNOSIS — J9611 Chronic respiratory failure with hypoxia: Secondary | ICD-10-CM | POA: Diagnosis not present

## 2019-08-01 DIAGNOSIS — Z7982 Long term (current) use of aspirin: Secondary | ICD-10-CM

## 2019-08-01 DIAGNOSIS — M47817 Spondylosis without myelopathy or radiculopathy, lumbosacral region: Secondary | ICD-10-CM

## 2019-08-01 DIAGNOSIS — R339 Retention of urine, unspecified: Secondary | ICD-10-CM

## 2019-08-01 DIAGNOSIS — Z87891 Personal history of nicotine dependence: Secondary | ICD-10-CM

## 2019-08-01 DIAGNOSIS — R3914 Feeling of incomplete bladder emptying: Secondary | ICD-10-CM

## 2019-08-01 DIAGNOSIS — Z7984 Long term (current) use of oral hypoglycemic drugs: Secondary | ICD-10-CM

## 2019-08-01 DIAGNOSIS — Z7951 Long term (current) use of inhaled steroids: Secondary | ICD-10-CM

## 2019-08-01 DIAGNOSIS — M48061 Spinal stenosis, lumbar region without neurogenic claudication: Secondary | ICD-10-CM

## 2019-08-04 DIAGNOSIS — I200 Unstable angina: Secondary | ICD-10-CM | POA: Diagnosis not present

## 2019-08-04 DIAGNOSIS — I272 Pulmonary hypertension, unspecified: Secondary | ICD-10-CM | POA: Diagnosis not present

## 2019-08-04 DIAGNOSIS — J432 Centrilobular emphysema: Secondary | ICD-10-CM | POA: Diagnosis not present

## 2019-08-04 DIAGNOSIS — N183 Chronic kidney disease, stage 3 (moderate): Secondary | ICD-10-CM | POA: Diagnosis not present

## 2019-08-04 DIAGNOSIS — E1122 Type 2 diabetes mellitus with diabetic chronic kidney disease: Secondary | ICD-10-CM | POA: Diagnosis not present

## 2019-08-04 DIAGNOSIS — G8929 Other chronic pain: Secondary | ICD-10-CM | POA: Diagnosis not present

## 2019-08-04 DIAGNOSIS — G4733 Obstructive sleep apnea (adult) (pediatric): Secondary | ICD-10-CM | POA: Diagnosis not present

## 2019-08-04 DIAGNOSIS — I129 Hypertensive chronic kidney disease with stage 1 through stage 4 chronic kidney disease, or unspecified chronic kidney disease: Secondary | ICD-10-CM | POA: Diagnosis not present

## 2019-08-04 DIAGNOSIS — J9611 Chronic respiratory failure with hypoxia: Secondary | ICD-10-CM | POA: Diagnosis not present

## 2019-08-04 DIAGNOSIS — M47817 Spondylosis without myelopathy or radiculopathy, lumbosacral region: Secondary | ICD-10-CM

## 2019-08-04 DIAGNOSIS — R3914 Feeling of incomplete bladder emptying: Secondary | ICD-10-CM

## 2019-08-04 DIAGNOSIS — Z87891 Personal history of nicotine dependence: Secondary | ICD-10-CM

## 2019-08-04 DIAGNOSIS — Z7982 Long term (current) use of aspirin: Secondary | ICD-10-CM

## 2019-08-04 DIAGNOSIS — M48061 Spinal stenosis, lumbar region without neurogenic claudication: Secondary | ICD-10-CM

## 2019-08-04 DIAGNOSIS — Z7951 Long term (current) use of inhaled steroids: Secondary | ICD-10-CM

## 2019-08-04 DIAGNOSIS — Z7984 Long term (current) use of oral hypoglycemic drugs: Secondary | ICD-10-CM

## 2019-08-04 DIAGNOSIS — R339 Retention of urine, unspecified: Secondary | ICD-10-CM

## 2019-08-07 ENCOUNTER — Telehealth: Admit: 2019-08-07 | Discharge: 2019-08-08 | Payer: MEDICARE

## 2019-08-07 DIAGNOSIS — R6881 Early satiety: Secondary | ICD-10-CM | POA: Diagnosis not present

## 2019-08-07 DIAGNOSIS — F1120 Opioid dependence, uncomplicated: Secondary | ICD-10-CM | POA: Diagnosis not present

## 2019-08-07 DIAGNOSIS — Z7151 Drug abuse counseling and surveillance of drug abuser: Secondary | ICD-10-CM | POA: Diagnosis not present

## 2019-08-07 DIAGNOSIS — M5430 Sciatica, unspecified side: Secondary | ICD-10-CM | POA: Diagnosis not present

## 2019-08-07 DIAGNOSIS — F17290 Nicotine dependence, other tobacco product, uncomplicated: Secondary | ICD-10-CM | POA: Diagnosis not present

## 2019-08-07 DIAGNOSIS — M5416 Radiculopathy, lumbar region: Secondary | ICD-10-CM | POA: Diagnosis not present

## 2019-08-07 DIAGNOSIS — R2241 Localized swelling, mass and lump, right lower limb: Secondary | ICD-10-CM | POA: Diagnosis not present

## 2019-08-07 DIAGNOSIS — G894 Chronic pain syndrome: Secondary | ICD-10-CM | POA: Diagnosis not present

## 2019-08-07 DIAGNOSIS — Z8701 Personal history of pneumonia (recurrent): Secondary | ICD-10-CM | POA: Diagnosis not present

## 2019-08-07 DIAGNOSIS — I10 Essential (primary) hypertension: Secondary | ICD-10-CM | POA: Diagnosis not present

## 2019-08-07 DIAGNOSIS — Z729 Problem related to lifestyle, unspecified: Secondary | ICD-10-CM | POA: Diagnosis not present

## 2019-08-07 DIAGNOSIS — N3020 Other chronic cystitis without hematuria: Secondary | ICD-10-CM | POA: Diagnosis not present

## 2019-08-07 DIAGNOSIS — J9611 Chronic respiratory failure with hypoxia: Secondary | ICD-10-CM | POA: Diagnosis not present

## 2019-08-07 DIAGNOSIS — E119 Type 2 diabetes mellitus without complications: Secondary | ICD-10-CM | POA: Diagnosis not present

## 2019-08-07 DIAGNOSIS — J449 Chronic obstructive pulmonary disease, unspecified: Secondary | ICD-10-CM | POA: Diagnosis not present

## 2019-08-07 DIAGNOSIS — J189 Pneumonia, unspecified organism: Secondary | ICD-10-CM

## 2019-08-07 DIAGNOSIS — N179 Acute kidney failure, unspecified: Secondary | ICD-10-CM

## 2019-08-07 DIAGNOSIS — G4730 Sleep apnea, unspecified: Secondary | ICD-10-CM

## 2019-08-09 DIAGNOSIS — G8929 Other chronic pain: Secondary | ICD-10-CM | POA: Diagnosis not present

## 2019-08-09 DIAGNOSIS — J432 Centrilobular emphysema: Secondary | ICD-10-CM | POA: Diagnosis not present

## 2019-08-09 DIAGNOSIS — J9611 Chronic respiratory failure with hypoxia: Secondary | ICD-10-CM | POA: Diagnosis not present

## 2019-08-09 DIAGNOSIS — I129 Hypertensive chronic kidney disease with stage 1 through stage 4 chronic kidney disease, or unspecified chronic kidney disease: Secondary | ICD-10-CM | POA: Diagnosis not present

## 2019-08-09 DIAGNOSIS — E1122 Type 2 diabetes mellitus with diabetic chronic kidney disease: Secondary | ICD-10-CM | POA: Diagnosis not present

## 2019-08-09 DIAGNOSIS — G4733 Obstructive sleep apnea (adult) (pediatric): Secondary | ICD-10-CM | POA: Diagnosis not present

## 2019-08-09 DIAGNOSIS — I200 Unstable angina: Secondary | ICD-10-CM | POA: Diagnosis not present

## 2019-08-09 DIAGNOSIS — I272 Pulmonary hypertension, unspecified: Secondary | ICD-10-CM | POA: Diagnosis not present

## 2019-08-09 DIAGNOSIS — N183 Chronic kidney disease, stage 3 (moderate): Secondary | ICD-10-CM | POA: Diagnosis not present

## 2019-08-09 DIAGNOSIS — Z7951 Long term (current) use of inhaled steroids: Secondary | ICD-10-CM

## 2019-08-09 DIAGNOSIS — M48061 Spinal stenosis, lumbar region without neurogenic claudication: Secondary | ICD-10-CM

## 2019-08-09 DIAGNOSIS — Z7982 Long term (current) use of aspirin: Secondary | ICD-10-CM

## 2019-08-09 DIAGNOSIS — M47817 Spondylosis without myelopathy or radiculopathy, lumbosacral region: Secondary | ICD-10-CM

## 2019-08-09 DIAGNOSIS — R339 Retention of urine, unspecified: Secondary | ICD-10-CM

## 2019-08-09 DIAGNOSIS — Z87891 Personal history of nicotine dependence: Secondary | ICD-10-CM

## 2019-08-09 DIAGNOSIS — Z7984 Long term (current) use of oral hypoglycemic drugs: Secondary | ICD-10-CM

## 2019-08-09 DIAGNOSIS — R3914 Feeling of incomplete bladder emptying: Secondary | ICD-10-CM

## 2019-08-11 DIAGNOSIS — I272 Pulmonary hypertension, unspecified: Secondary | ICD-10-CM | POA: Diagnosis not present

## 2019-08-11 DIAGNOSIS — G8929 Other chronic pain: Secondary | ICD-10-CM | POA: Diagnosis not present

## 2019-08-11 DIAGNOSIS — N183 Chronic kidney disease, stage 3 (moderate): Secondary | ICD-10-CM | POA: Diagnosis not present

## 2019-08-11 DIAGNOSIS — G4733 Obstructive sleep apnea (adult) (pediatric): Secondary | ICD-10-CM | POA: Diagnosis not present

## 2019-08-11 DIAGNOSIS — J9611 Chronic respiratory failure with hypoxia: Secondary | ICD-10-CM | POA: Diagnosis not present

## 2019-08-11 DIAGNOSIS — I200 Unstable angina: Secondary | ICD-10-CM | POA: Diagnosis not present

## 2019-08-11 DIAGNOSIS — I129 Hypertensive chronic kidney disease with stage 1 through stage 4 chronic kidney disease, or unspecified chronic kidney disease: Secondary | ICD-10-CM | POA: Diagnosis not present

## 2019-08-11 DIAGNOSIS — E1122 Type 2 diabetes mellitus with diabetic chronic kidney disease: Secondary | ICD-10-CM | POA: Diagnosis not present

## 2019-08-11 DIAGNOSIS — J432 Centrilobular emphysema: Secondary | ICD-10-CM | POA: Diagnosis not present

## 2019-08-11 DIAGNOSIS — M47817 Spondylosis without myelopathy or radiculopathy, lumbosacral region: Secondary | ICD-10-CM

## 2019-08-11 DIAGNOSIS — R339 Retention of urine, unspecified: Secondary | ICD-10-CM

## 2019-08-11 DIAGNOSIS — Z7982 Long term (current) use of aspirin: Secondary | ICD-10-CM

## 2019-08-11 DIAGNOSIS — Z7984 Long term (current) use of oral hypoglycemic drugs: Secondary | ICD-10-CM

## 2019-08-11 DIAGNOSIS — M48061 Spinal stenosis, lumbar region without neurogenic claudication: Secondary | ICD-10-CM

## 2019-08-11 DIAGNOSIS — R3914 Feeling of incomplete bladder emptying: Secondary | ICD-10-CM

## 2019-08-11 DIAGNOSIS — Z87891 Personal history of nicotine dependence: Secondary | ICD-10-CM

## 2019-08-11 DIAGNOSIS — Z7951 Long term (current) use of inhaled steroids: Secondary | ICD-10-CM

## 2019-08-12 DIAGNOSIS — R062 Wheezing: Secondary | ICD-10-CM | POA: Diagnosis not present

## 2019-08-12 DIAGNOSIS — J45909 Unspecified asthma, uncomplicated: Secondary | ICD-10-CM | POA: Diagnosis not present

## 2019-08-12 DIAGNOSIS — J449 Chronic obstructive pulmonary disease, unspecified: Secondary | ICD-10-CM | POA: Diagnosis not present

## 2019-08-13 DIAGNOSIS — N183 Chronic kidney disease, stage 3 (moderate): Secondary | ICD-10-CM

## 2019-08-13 DIAGNOSIS — Z7951 Long term (current) use of inhaled steroids: Secondary | ICD-10-CM

## 2019-08-13 DIAGNOSIS — J432 Centrilobular emphysema: Principal | ICD-10-CM

## 2019-08-13 DIAGNOSIS — Z7982 Long term (current) use of aspirin: Secondary | ICD-10-CM

## 2019-08-13 DIAGNOSIS — E1122 Type 2 diabetes mellitus with diabetic chronic kidney disease: Secondary | ICD-10-CM

## 2019-08-13 DIAGNOSIS — I272 Pulmonary hypertension, unspecified: Secondary | ICD-10-CM

## 2019-08-13 DIAGNOSIS — G8929 Other chronic pain: Secondary | ICD-10-CM

## 2019-08-13 DIAGNOSIS — Z87891 Personal history of nicotine dependence: Secondary | ICD-10-CM

## 2019-08-13 DIAGNOSIS — Z7984 Long term (current) use of oral hypoglycemic drugs: Secondary | ICD-10-CM

## 2019-08-13 DIAGNOSIS — R3914 Feeling of incomplete bladder emptying: Secondary | ICD-10-CM

## 2019-08-13 DIAGNOSIS — I129 Hypertensive chronic kidney disease with stage 1 through stage 4 chronic kidney disease, or unspecified chronic kidney disease: Secondary | ICD-10-CM

## 2019-08-13 DIAGNOSIS — M48061 Spinal stenosis, lumbar region without neurogenic claudication: Secondary | ICD-10-CM

## 2019-08-13 DIAGNOSIS — I200 Unstable angina: Secondary | ICD-10-CM

## 2019-08-13 DIAGNOSIS — M47817 Spondylosis without myelopathy or radiculopathy, lumbosacral region: Secondary | ICD-10-CM

## 2019-08-13 DIAGNOSIS — G4733 Obstructive sleep apnea (adult) (pediatric): Secondary | ICD-10-CM

## 2019-08-13 DIAGNOSIS — R339 Retention of urine, unspecified: Secondary | ICD-10-CM

## 2019-08-13 DIAGNOSIS — J9611 Chronic respiratory failure with hypoxia: Secondary | ICD-10-CM

## 2019-08-14 ENCOUNTER — Ambulatory Visit
Admit: 2019-08-14 | Discharge: 2019-08-15 | Payer: MEDICARE | Attending: Student in an Organized Health Care Education/Training Program | Primary: Student in an Organized Health Care Education/Training Program

## 2019-08-14 ENCOUNTER — Ambulatory Visit: Admit: 2019-08-14 | Discharge: 2019-08-15 | Payer: MEDICARE

## 2019-08-14 DIAGNOSIS — I10 Essential (primary) hypertension: Secondary | ICD-10-CM | POA: Diagnosis not present

## 2019-08-14 DIAGNOSIS — R6881 Early satiety: Secondary | ICD-10-CM | POA: Diagnosis not present

## 2019-08-14 DIAGNOSIS — R2241 Localized swelling, mass and lump, right lower limb: Secondary | ICD-10-CM | POA: Diagnosis not present

## 2019-08-14 DIAGNOSIS — E669 Obesity, unspecified: Secondary | ICD-10-CM | POA: Diagnosis not present

## 2019-08-14 DIAGNOSIS — M7989 Other specified soft tissue disorders: Secondary | ICD-10-CM | POA: Diagnosis not present

## 2019-08-14 DIAGNOSIS — J90 Pleural effusion, not elsewhere classified: Secondary | ICD-10-CM | POA: Diagnosis not present

## 2019-08-14 DIAGNOSIS — J189 Pneumonia, unspecified organism: Secondary | ICD-10-CM | POA: Diagnosis not present

## 2019-08-14 DIAGNOSIS — D649 Anemia, unspecified: Secondary | ICD-10-CM

## 2019-08-15 DIAGNOSIS — I129 Hypertensive chronic kidney disease with stage 1 through stage 4 chronic kidney disease, or unspecified chronic kidney disease: Secondary | ICD-10-CM | POA: Diagnosis not present

## 2019-08-15 DIAGNOSIS — J9611 Chronic respiratory failure with hypoxia: Secondary | ICD-10-CM | POA: Diagnosis not present

## 2019-08-15 DIAGNOSIS — I272 Pulmonary hypertension, unspecified: Secondary | ICD-10-CM | POA: Diagnosis not present

## 2019-08-15 DIAGNOSIS — G4733 Obstructive sleep apnea (adult) (pediatric): Secondary | ICD-10-CM | POA: Diagnosis not present

## 2019-08-15 DIAGNOSIS — N183 Chronic kidney disease, stage 3 (moderate): Secondary | ICD-10-CM | POA: Diagnosis not present

## 2019-08-15 DIAGNOSIS — E1122 Type 2 diabetes mellitus with diabetic chronic kidney disease: Secondary | ICD-10-CM | POA: Diagnosis not present

## 2019-08-15 DIAGNOSIS — I200 Unstable angina: Secondary | ICD-10-CM | POA: Diagnosis not present

## 2019-08-15 DIAGNOSIS — J432 Centrilobular emphysema: Secondary | ICD-10-CM | POA: Diagnosis not present

## 2019-08-15 DIAGNOSIS — G8929 Other chronic pain: Secondary | ICD-10-CM | POA: Diagnosis not present

## 2019-08-15 DIAGNOSIS — R3914 Feeling of incomplete bladder emptying: Secondary | ICD-10-CM

## 2019-08-15 DIAGNOSIS — R339 Retention of urine, unspecified: Secondary | ICD-10-CM

## 2019-08-15 DIAGNOSIS — Z7951 Long term (current) use of inhaled steroids: Secondary | ICD-10-CM

## 2019-08-15 DIAGNOSIS — Z7982 Long term (current) use of aspirin: Secondary | ICD-10-CM

## 2019-08-15 DIAGNOSIS — M48061 Spinal stenosis, lumbar region without neurogenic claudication: Secondary | ICD-10-CM

## 2019-08-15 DIAGNOSIS — M47817 Spondylosis without myelopathy or radiculopathy, lumbosacral region: Secondary | ICD-10-CM

## 2019-08-15 DIAGNOSIS — Z7984 Long term (current) use of oral hypoglycemic drugs: Secondary | ICD-10-CM

## 2019-08-15 DIAGNOSIS — Z87891 Personal history of nicotine dependence: Secondary | ICD-10-CM

## 2019-08-18 DIAGNOSIS — E1122 Type 2 diabetes mellitus with diabetic chronic kidney disease: Secondary | ICD-10-CM | POA: Diagnosis not present

## 2019-08-18 DIAGNOSIS — N183 Chronic kidney disease, stage 3 (moderate): Secondary | ICD-10-CM | POA: Diagnosis not present

## 2019-08-18 DIAGNOSIS — J9611 Chronic respiratory failure with hypoxia: Secondary | ICD-10-CM | POA: Diagnosis not present

## 2019-08-18 DIAGNOSIS — G8929 Other chronic pain: Secondary | ICD-10-CM | POA: Diagnosis not present

## 2019-08-18 DIAGNOSIS — I129 Hypertensive chronic kidney disease with stage 1 through stage 4 chronic kidney disease, or unspecified chronic kidney disease: Secondary | ICD-10-CM | POA: Diagnosis not present

## 2019-08-18 DIAGNOSIS — I200 Unstable angina: Secondary | ICD-10-CM | POA: Diagnosis not present

## 2019-08-18 DIAGNOSIS — G4733 Obstructive sleep apnea (adult) (pediatric): Secondary | ICD-10-CM | POA: Diagnosis not present

## 2019-08-18 DIAGNOSIS — J432 Centrilobular emphysema: Secondary | ICD-10-CM | POA: Diagnosis not present

## 2019-08-18 DIAGNOSIS — I272 Pulmonary hypertension, unspecified: Secondary | ICD-10-CM | POA: Diagnosis not present

## 2019-08-18 DIAGNOSIS — Z7984 Long term (current) use of oral hypoglycemic drugs: Secondary | ICD-10-CM

## 2019-08-18 DIAGNOSIS — M48061 Spinal stenosis, lumbar region without neurogenic claudication: Secondary | ICD-10-CM

## 2019-08-18 DIAGNOSIS — Z87891 Personal history of nicotine dependence: Secondary | ICD-10-CM

## 2019-08-18 DIAGNOSIS — M47817 Spondylosis without myelopathy or radiculopathy, lumbosacral region: Secondary | ICD-10-CM

## 2019-08-18 DIAGNOSIS — Z7951 Long term (current) use of inhaled steroids: Secondary | ICD-10-CM

## 2019-08-18 DIAGNOSIS — R339 Retention of urine, unspecified: Secondary | ICD-10-CM

## 2019-08-18 DIAGNOSIS — Z7982 Long term (current) use of aspirin: Secondary | ICD-10-CM

## 2019-08-18 DIAGNOSIS — R3914 Feeling of incomplete bladder emptying: Secondary | ICD-10-CM

## 2019-08-21 DIAGNOSIS — E1122 Type 2 diabetes mellitus with diabetic chronic kidney disease: Secondary | ICD-10-CM | POA: Diagnosis not present

## 2019-08-21 DIAGNOSIS — I272 Pulmonary hypertension, unspecified: Secondary | ICD-10-CM | POA: Diagnosis not present

## 2019-08-21 DIAGNOSIS — J9611 Chronic respiratory failure with hypoxia: Secondary | ICD-10-CM | POA: Diagnosis not present

## 2019-08-21 DIAGNOSIS — N183 Chronic kidney disease, stage 3 (moderate): Secondary | ICD-10-CM | POA: Diagnosis not present

## 2019-08-21 DIAGNOSIS — G4733 Obstructive sleep apnea (adult) (pediatric): Secondary | ICD-10-CM | POA: Diagnosis not present

## 2019-08-21 DIAGNOSIS — I200 Unstable angina: Secondary | ICD-10-CM | POA: Diagnosis not present

## 2019-08-21 DIAGNOSIS — J432 Centrilobular emphysema: Secondary | ICD-10-CM | POA: Diagnosis not present

## 2019-08-21 DIAGNOSIS — I129 Hypertensive chronic kidney disease with stage 1 through stage 4 chronic kidney disease, or unspecified chronic kidney disease: Secondary | ICD-10-CM | POA: Diagnosis not present

## 2019-08-21 DIAGNOSIS — G8929 Other chronic pain: Secondary | ICD-10-CM | POA: Diagnosis not present

## 2019-08-21 DIAGNOSIS — R339 Retention of urine, unspecified: Secondary | ICD-10-CM

## 2019-08-21 DIAGNOSIS — R3914 Feeling of incomplete bladder emptying: Secondary | ICD-10-CM

## 2019-08-21 DIAGNOSIS — Z87891 Personal history of nicotine dependence: Secondary | ICD-10-CM

## 2019-08-21 DIAGNOSIS — M47817 Spondylosis without myelopathy or radiculopathy, lumbosacral region: Secondary | ICD-10-CM

## 2019-08-21 DIAGNOSIS — M48061 Spinal stenosis, lumbar region without neurogenic claudication: Secondary | ICD-10-CM

## 2019-08-21 DIAGNOSIS — Z7984 Long term (current) use of oral hypoglycemic drugs: Secondary | ICD-10-CM

## 2019-08-21 DIAGNOSIS — Z7951 Long term (current) use of inhaled steroids: Secondary | ICD-10-CM

## 2019-08-21 DIAGNOSIS — Z7982 Long term (current) use of aspirin: Secondary | ICD-10-CM

## 2019-08-22 ENCOUNTER — Ambulatory Visit: Admit: 2019-08-22 | Discharge: 2019-08-23 | Payer: MEDICARE

## 2019-08-22 DIAGNOSIS — D649 Anemia, unspecified: Secondary | ICD-10-CM | POA: Diagnosis not present

## 2019-08-22 DIAGNOSIS — J9611 Chronic respiratory failure with hypoxia: Secondary | ICD-10-CM | POA: Diagnosis not present

## 2019-08-22 DIAGNOSIS — J449 Chronic obstructive pulmonary disease, unspecified: Secondary | ICD-10-CM | POA: Diagnosis not present

## 2019-08-22 DIAGNOSIS — R0602 Shortness of breath: Secondary | ICD-10-CM | POA: Diagnosis not present

## 2019-08-22 DIAGNOSIS — Z23 Encounter for immunization: Secondary | ICD-10-CM | POA: Diagnosis not present

## 2019-08-22 DIAGNOSIS — I10 Essential (primary) hypertension: Secondary | ICD-10-CM | POA: Diagnosis not present

## 2019-08-22 DIAGNOSIS — Z8679 Personal history of other diseases of the circulatory system: Secondary | ICD-10-CM | POA: Diagnosis not present

## 2019-08-22 DIAGNOSIS — E119 Type 2 diabetes mellitus without complications: Secondary | ICD-10-CM | POA: Diagnosis not present

## 2019-08-22 DIAGNOSIS — G4730 Sleep apnea, unspecified: Secondary | ICD-10-CM | POA: Diagnosis not present

## 2019-08-23 DIAGNOSIS — J9611 Chronic respiratory failure with hypoxia: Secondary | ICD-10-CM | POA: Diagnosis not present

## 2019-08-23 DIAGNOSIS — I129 Hypertensive chronic kidney disease with stage 1 through stage 4 chronic kidney disease, or unspecified chronic kidney disease: Secondary | ICD-10-CM | POA: Diagnosis not present

## 2019-08-23 DIAGNOSIS — G4733 Obstructive sleep apnea (adult) (pediatric): Secondary | ICD-10-CM | POA: Diagnosis not present

## 2019-08-23 DIAGNOSIS — J432 Centrilobular emphysema: Secondary | ICD-10-CM | POA: Diagnosis not present

## 2019-08-23 DIAGNOSIS — I200 Unstable angina: Secondary | ICD-10-CM | POA: Diagnosis not present

## 2019-08-23 DIAGNOSIS — I272 Pulmonary hypertension, unspecified: Secondary | ICD-10-CM | POA: Diagnosis not present

## 2019-08-23 DIAGNOSIS — G8929 Other chronic pain: Secondary | ICD-10-CM | POA: Diagnosis not present

## 2019-08-23 DIAGNOSIS — N183 Chronic kidney disease, stage 3 (moderate): Secondary | ICD-10-CM | POA: Diagnosis not present

## 2019-08-23 DIAGNOSIS — E1122 Type 2 diabetes mellitus with diabetic chronic kidney disease: Secondary | ICD-10-CM | POA: Diagnosis not present

## 2019-08-23 DIAGNOSIS — M47817 Spondylosis without myelopathy or radiculopathy, lumbosacral region: Secondary | ICD-10-CM

## 2019-08-23 DIAGNOSIS — Z7984 Long term (current) use of oral hypoglycemic drugs: Secondary | ICD-10-CM

## 2019-08-23 DIAGNOSIS — R3914 Feeling of incomplete bladder emptying: Secondary | ICD-10-CM

## 2019-08-23 DIAGNOSIS — R339 Retention of urine, unspecified: Secondary | ICD-10-CM

## 2019-08-23 DIAGNOSIS — M48061 Spinal stenosis, lumbar region without neurogenic claudication: Secondary | ICD-10-CM

## 2019-08-23 DIAGNOSIS — Z7951 Long term (current) use of inhaled steroids: Secondary | ICD-10-CM

## 2019-08-23 DIAGNOSIS — Z7982 Long term (current) use of aspirin: Secondary | ICD-10-CM

## 2019-08-23 DIAGNOSIS — Z87891 Personal history of nicotine dependence: Secondary | ICD-10-CM

## 2019-08-25 DIAGNOSIS — E1122 Type 2 diabetes mellitus with diabetic chronic kidney disease: Secondary | ICD-10-CM | POA: Diagnosis not present

## 2019-08-25 DIAGNOSIS — I200 Unstable angina: Secondary | ICD-10-CM | POA: Diagnosis not present

## 2019-08-25 DIAGNOSIS — N183 Chronic kidney disease, stage 3 (moderate): Secondary | ICD-10-CM | POA: Diagnosis not present

## 2019-08-25 DIAGNOSIS — J9611 Chronic respiratory failure with hypoxia: Secondary | ICD-10-CM | POA: Diagnosis not present

## 2019-08-25 DIAGNOSIS — G8929 Other chronic pain: Secondary | ICD-10-CM | POA: Diagnosis not present

## 2019-08-25 DIAGNOSIS — G4733 Obstructive sleep apnea (adult) (pediatric): Secondary | ICD-10-CM | POA: Diagnosis not present

## 2019-08-25 DIAGNOSIS — I129 Hypertensive chronic kidney disease with stage 1 through stage 4 chronic kidney disease, or unspecified chronic kidney disease: Secondary | ICD-10-CM | POA: Diagnosis not present

## 2019-08-25 DIAGNOSIS — I272 Pulmonary hypertension, unspecified: Secondary | ICD-10-CM | POA: Diagnosis not present

## 2019-08-25 DIAGNOSIS — J432 Centrilobular emphysema: Secondary | ICD-10-CM | POA: Diagnosis not present

## 2019-08-25 DIAGNOSIS — R339 Retention of urine, unspecified: Secondary | ICD-10-CM

## 2019-08-25 DIAGNOSIS — Z7984 Long term (current) use of oral hypoglycemic drugs: Secondary | ICD-10-CM

## 2019-08-25 DIAGNOSIS — M47817 Spondylosis without myelopathy or radiculopathy, lumbosacral region: Secondary | ICD-10-CM

## 2019-08-25 DIAGNOSIS — Z87891 Personal history of nicotine dependence: Secondary | ICD-10-CM

## 2019-08-25 DIAGNOSIS — M48061 Spinal stenosis, lumbar region without neurogenic claudication: Secondary | ICD-10-CM

## 2019-08-25 DIAGNOSIS — Z7982 Long term (current) use of aspirin: Secondary | ICD-10-CM

## 2019-08-25 DIAGNOSIS — Z7951 Long term (current) use of inhaled steroids: Secondary | ICD-10-CM

## 2019-08-25 DIAGNOSIS — R3914 Feeling of incomplete bladder emptying: Secondary | ICD-10-CM

## 2019-08-26 DIAGNOSIS — J9612 Chronic respiratory failure with hypercapnia: Secondary | ICD-10-CM | POA: Diagnosis not present

## 2019-08-26 DIAGNOSIS — J449 Chronic obstructive pulmonary disease, unspecified: Secondary | ICD-10-CM | POA: Diagnosis not present

## 2019-08-27 ENCOUNTER — Encounter: Admit: 2019-08-27 | Discharge: 2019-09-07 | Payer: MEDICARE

## 2019-08-28 DIAGNOSIS — I200 Unstable angina: Secondary | ICD-10-CM | POA: Diagnosis not present

## 2019-08-28 DIAGNOSIS — G8929 Other chronic pain: Secondary | ICD-10-CM | POA: Diagnosis not present

## 2019-08-28 DIAGNOSIS — I272 Pulmonary hypertension, unspecified: Secondary | ICD-10-CM | POA: Diagnosis not present

## 2019-08-28 DIAGNOSIS — J9611 Chronic respiratory failure with hypoxia: Secondary | ICD-10-CM | POA: Diagnosis not present

## 2019-08-28 DIAGNOSIS — J432 Centrilobular emphysema: Secondary | ICD-10-CM | POA: Diagnosis not present

## 2019-08-28 DIAGNOSIS — G4733 Obstructive sleep apnea (adult) (pediatric): Secondary | ICD-10-CM | POA: Diagnosis not present

## 2019-08-28 DIAGNOSIS — N183 Chronic kidney disease, stage 3 (moderate): Secondary | ICD-10-CM | POA: Diagnosis not present

## 2019-08-28 DIAGNOSIS — E1122 Type 2 diabetes mellitus with diabetic chronic kidney disease: Secondary | ICD-10-CM | POA: Diagnosis not present

## 2019-08-28 DIAGNOSIS — I129 Hypertensive chronic kidney disease with stage 1 through stage 4 chronic kidney disease, or unspecified chronic kidney disease: Secondary | ICD-10-CM | POA: Diagnosis not present

## 2019-08-30 DIAGNOSIS — G8929 Other chronic pain: Secondary | ICD-10-CM | POA: Diagnosis not present

## 2019-08-30 DIAGNOSIS — N183 Chronic kidney disease, stage 3 (moderate): Secondary | ICD-10-CM | POA: Diagnosis not present

## 2019-08-30 DIAGNOSIS — G4733 Obstructive sleep apnea (adult) (pediatric): Secondary | ICD-10-CM | POA: Diagnosis not present

## 2019-08-30 DIAGNOSIS — I200 Unstable angina: Secondary | ICD-10-CM | POA: Diagnosis not present

## 2019-08-30 DIAGNOSIS — J9611 Chronic respiratory failure with hypoxia: Secondary | ICD-10-CM | POA: Diagnosis not present

## 2019-08-30 DIAGNOSIS — J432 Centrilobular emphysema: Secondary | ICD-10-CM | POA: Diagnosis not present

## 2019-08-30 DIAGNOSIS — I272 Pulmonary hypertension, unspecified: Secondary | ICD-10-CM | POA: Diagnosis not present

## 2019-08-30 DIAGNOSIS — E1122 Type 2 diabetes mellitus with diabetic chronic kidney disease: Secondary | ICD-10-CM | POA: Diagnosis not present

## 2019-08-30 DIAGNOSIS — I129 Hypertensive chronic kidney disease with stage 1 through stage 4 chronic kidney disease, or unspecified chronic kidney disease: Secondary | ICD-10-CM | POA: Diagnosis not present

## 2019-08-30 DIAGNOSIS — R3914 Feeling of incomplete bladder emptying: Secondary | ICD-10-CM

## 2019-08-30 DIAGNOSIS — Z7951 Long term (current) use of inhaled steroids: Secondary | ICD-10-CM

## 2019-08-30 DIAGNOSIS — Z7984 Long term (current) use of oral hypoglycemic drugs: Secondary | ICD-10-CM

## 2019-08-30 DIAGNOSIS — Z7982 Long term (current) use of aspirin: Secondary | ICD-10-CM

## 2019-08-30 DIAGNOSIS — M48061 Spinal stenosis, lumbar region without neurogenic claudication: Secondary | ICD-10-CM

## 2019-08-30 DIAGNOSIS — R339 Retention of urine, unspecified: Secondary | ICD-10-CM

## 2019-08-30 DIAGNOSIS — Z87891 Personal history of nicotine dependence: Secondary | ICD-10-CM

## 2019-08-30 DIAGNOSIS — M47817 Spondylosis without myelopathy or radiculopathy, lumbosacral region: Secondary | ICD-10-CM

## 2019-09-01 DIAGNOSIS — I129 Hypertensive chronic kidney disease with stage 1 through stage 4 chronic kidney disease, or unspecified chronic kidney disease: Secondary | ICD-10-CM | POA: Diagnosis not present

## 2019-09-01 DIAGNOSIS — I272 Pulmonary hypertension, unspecified: Secondary | ICD-10-CM | POA: Diagnosis not present

## 2019-09-01 DIAGNOSIS — N183 Chronic kidney disease, stage 3 (moderate): Secondary | ICD-10-CM | POA: Diagnosis not present

## 2019-09-01 DIAGNOSIS — J432 Centrilobular emphysema: Secondary | ICD-10-CM | POA: Diagnosis not present

## 2019-09-01 DIAGNOSIS — I200 Unstable angina: Secondary | ICD-10-CM | POA: Diagnosis not present

## 2019-09-01 DIAGNOSIS — E1122 Type 2 diabetes mellitus with diabetic chronic kidney disease: Secondary | ICD-10-CM | POA: Diagnosis not present

## 2019-09-01 DIAGNOSIS — J9611 Chronic respiratory failure with hypoxia: Secondary | ICD-10-CM | POA: Diagnosis not present

## 2019-09-01 DIAGNOSIS — G8929 Other chronic pain: Secondary | ICD-10-CM | POA: Diagnosis not present

## 2019-09-01 DIAGNOSIS — G4733 Obstructive sleep apnea (adult) (pediatric): Secondary | ICD-10-CM | POA: Diagnosis not present

## 2019-09-01 DIAGNOSIS — Z7951 Long term (current) use of inhaled steroids: Secondary | ICD-10-CM

## 2019-09-01 DIAGNOSIS — M48061 Spinal stenosis, lumbar region without neurogenic claudication: Secondary | ICD-10-CM

## 2019-09-01 DIAGNOSIS — Z7984 Long term (current) use of oral hypoglycemic drugs: Secondary | ICD-10-CM

## 2019-09-01 DIAGNOSIS — Z7982 Long term (current) use of aspirin: Secondary | ICD-10-CM

## 2019-09-01 DIAGNOSIS — R3914 Feeling of incomplete bladder emptying: Secondary | ICD-10-CM

## 2019-09-01 DIAGNOSIS — M47817 Spondylosis without myelopathy or radiculopathy, lumbosacral region: Secondary | ICD-10-CM

## 2019-09-01 DIAGNOSIS — Z87891 Personal history of nicotine dependence: Secondary | ICD-10-CM

## 2019-09-01 DIAGNOSIS — R339 Retention of urine, unspecified: Secondary | ICD-10-CM

## 2019-09-05 DIAGNOSIS — N183 Chronic kidney disease, stage 3 (moderate): Secondary | ICD-10-CM | POA: Diagnosis not present

## 2019-09-05 DIAGNOSIS — I200 Unstable angina: Secondary | ICD-10-CM | POA: Diagnosis not present

## 2019-09-05 DIAGNOSIS — G8929 Other chronic pain: Secondary | ICD-10-CM | POA: Diagnosis not present

## 2019-09-05 DIAGNOSIS — J9611 Chronic respiratory failure with hypoxia: Secondary | ICD-10-CM | POA: Diagnosis not present

## 2019-09-05 DIAGNOSIS — E1122 Type 2 diabetes mellitus with diabetic chronic kidney disease: Secondary | ICD-10-CM | POA: Diagnosis not present

## 2019-09-05 DIAGNOSIS — J432 Centrilobular emphysema: Secondary | ICD-10-CM | POA: Diagnosis not present

## 2019-09-05 DIAGNOSIS — I129 Hypertensive chronic kidney disease with stage 1 through stage 4 chronic kidney disease, or unspecified chronic kidney disease: Secondary | ICD-10-CM | POA: Diagnosis not present

## 2019-09-05 DIAGNOSIS — I272 Pulmonary hypertension, unspecified: Secondary | ICD-10-CM | POA: Diagnosis not present

## 2019-09-05 DIAGNOSIS — G4733 Obstructive sleep apnea (adult) (pediatric): Secondary | ICD-10-CM | POA: Diagnosis not present

## 2019-09-05 DIAGNOSIS — Z7982 Long term (current) use of aspirin: Secondary | ICD-10-CM

## 2019-09-05 DIAGNOSIS — M48061 Spinal stenosis, lumbar region without neurogenic claudication: Secondary | ICD-10-CM

## 2019-09-05 DIAGNOSIS — Z87891 Personal history of nicotine dependence: Secondary | ICD-10-CM

## 2019-09-05 DIAGNOSIS — R3914 Feeling of incomplete bladder emptying: Secondary | ICD-10-CM

## 2019-09-05 DIAGNOSIS — R339 Retention of urine, unspecified: Secondary | ICD-10-CM

## 2019-09-05 DIAGNOSIS — M47817 Spondylosis without myelopathy or radiculopathy, lumbosacral region: Secondary | ICD-10-CM

## 2019-09-05 DIAGNOSIS — Z7951 Long term (current) use of inhaled steroids: Secondary | ICD-10-CM

## 2019-09-05 DIAGNOSIS — Z7984 Long term (current) use of oral hypoglycemic drugs: Secondary | ICD-10-CM

## 2019-09-06 DIAGNOSIS — M5416 Radiculopathy, lumbar region: Secondary | ICD-10-CM | POA: Diagnosis not present

## 2019-09-06 DIAGNOSIS — F17290 Nicotine dependence, other tobacco product, uncomplicated: Secondary | ICD-10-CM | POA: Diagnosis not present

## 2019-09-06 DIAGNOSIS — E119 Type 2 diabetes mellitus without complications: Secondary | ICD-10-CM | POA: Diagnosis not present

## 2019-09-06 DIAGNOSIS — M5430 Sciatica, unspecified side: Secondary | ICD-10-CM | POA: Diagnosis not present

## 2019-09-06 DIAGNOSIS — Z729 Problem related to lifestyle, unspecified: Secondary | ICD-10-CM | POA: Diagnosis not present

## 2019-09-06 DIAGNOSIS — Z7151 Drug abuse counseling and surveillance of drug abuser: Secondary | ICD-10-CM | POA: Diagnosis not present

## 2019-09-06 DIAGNOSIS — F1120 Opioid dependence, uncomplicated: Secondary | ICD-10-CM | POA: Diagnosis not present

## 2019-09-06 DIAGNOSIS — G894 Chronic pain syndrome: Secondary | ICD-10-CM | POA: Diagnosis not present

## 2019-09-07 DIAGNOSIS — I200 Unstable angina: Secondary | ICD-10-CM | POA: Diagnosis not present

## 2019-09-07 DIAGNOSIS — E1122 Type 2 diabetes mellitus with diabetic chronic kidney disease: Secondary | ICD-10-CM | POA: Diagnosis not present

## 2019-09-07 DIAGNOSIS — G4733 Obstructive sleep apnea (adult) (pediatric): Secondary | ICD-10-CM | POA: Diagnosis not present

## 2019-09-07 DIAGNOSIS — I272 Pulmonary hypertension, unspecified: Secondary | ICD-10-CM | POA: Diagnosis not present

## 2019-09-07 DIAGNOSIS — G8929 Other chronic pain: Secondary | ICD-10-CM | POA: Diagnosis not present

## 2019-09-07 DIAGNOSIS — J9611 Chronic respiratory failure with hypoxia: Secondary | ICD-10-CM | POA: Diagnosis not present

## 2019-09-07 DIAGNOSIS — J432 Centrilobular emphysema: Secondary | ICD-10-CM | POA: Diagnosis not present

## 2019-09-07 DIAGNOSIS — N183 Chronic kidney disease, stage 3 (moderate): Secondary | ICD-10-CM | POA: Diagnosis not present

## 2019-09-07 DIAGNOSIS — I129 Hypertensive chronic kidney disease with stage 1 through stage 4 chronic kidney disease, or unspecified chronic kidney disease: Secondary | ICD-10-CM | POA: Diagnosis not present

## 2019-09-07 DIAGNOSIS — R3914 Feeling of incomplete bladder emptying: Secondary | ICD-10-CM

## 2019-09-07 DIAGNOSIS — M47817 Spondylosis without myelopathy or radiculopathy, lumbosacral region: Secondary | ICD-10-CM

## 2019-09-07 DIAGNOSIS — Z7982 Long term (current) use of aspirin: Secondary | ICD-10-CM

## 2019-09-07 DIAGNOSIS — Z7951 Long term (current) use of inhaled steroids: Secondary | ICD-10-CM

## 2019-09-07 DIAGNOSIS — Z87891 Personal history of nicotine dependence: Secondary | ICD-10-CM

## 2019-09-07 DIAGNOSIS — R339 Retention of urine, unspecified: Secondary | ICD-10-CM

## 2019-09-07 DIAGNOSIS — M48061 Spinal stenosis, lumbar region without neurogenic claudication: Secondary | ICD-10-CM

## 2019-09-07 DIAGNOSIS — Z7984 Long term (current) use of oral hypoglycemic drugs: Secondary | ICD-10-CM

## 2019-09-08 ENCOUNTER — Ambulatory Visit: Admit: 2019-09-08 | Discharge: 2019-09-08 | Payer: MEDICARE

## 2019-09-08 DIAGNOSIS — G4733 Obstructive sleep apnea (adult) (pediatric): Secondary | ICD-10-CM | POA: Diagnosis not present

## 2019-09-08 DIAGNOSIS — J441 Chronic obstructive pulmonary disease with (acute) exacerbation: Secondary | ICD-10-CM | POA: Diagnosis not present

## 2019-09-08 DIAGNOSIS — N183 Chronic kidney disease, stage 3 (moderate): Secondary | ICD-10-CM | POA: Diagnosis not present

## 2019-09-08 DIAGNOSIS — E119 Type 2 diabetes mellitus without complications: Secondary | ICD-10-CM | POA: Diagnosis not present

## 2019-09-08 DIAGNOSIS — G8929 Other chronic pain: Secondary | ICD-10-CM | POA: Diagnosis not present

## 2019-09-08 DIAGNOSIS — J449 Chronic obstructive pulmonary disease, unspecified: Secondary | ICD-10-CM | POA: Diagnosis not present

## 2019-09-08 DIAGNOSIS — I272 Pulmonary hypertension, unspecified: Secondary | ICD-10-CM | POA: Diagnosis not present

## 2019-09-08 DIAGNOSIS — I480 Paroxysmal atrial fibrillation: Secondary | ICD-10-CM | POA: Diagnosis not present

## 2019-09-08 DIAGNOSIS — Z20828 Contact with and (suspected) exposure to other viral communicable diseases: Secondary | ICD-10-CM | POA: Diagnosis not present

## 2019-09-08 DIAGNOSIS — R918 Other nonspecific abnormal finding of lung field: Secondary | ICD-10-CM | POA: Diagnosis not present

## 2019-09-08 DIAGNOSIS — I129 Hypertensive chronic kidney disease with stage 1 through stage 4 chronic kidney disease, or unspecified chronic kidney disease: Secondary | ICD-10-CM | POA: Diagnosis not present

## 2019-09-08 DIAGNOSIS — I200 Unstable angina: Secondary | ICD-10-CM | POA: Diagnosis not present

## 2019-09-08 DIAGNOSIS — Z23 Encounter for immunization: Secondary | ICD-10-CM | POA: Diagnosis not present

## 2019-09-08 DIAGNOSIS — I509 Heart failure, unspecified: Secondary | ICD-10-CM | POA: Diagnosis not present

## 2019-09-08 DIAGNOSIS — J189 Pneumonia, unspecified organism: Secondary | ICD-10-CM | POA: Diagnosis not present

## 2019-09-08 DIAGNOSIS — E1122 Type 2 diabetes mellitus with diabetic chronic kidney disease: Secondary | ICD-10-CM | POA: Diagnosis not present

## 2019-09-08 DIAGNOSIS — J9611 Chronic respiratory failure with hypoxia: Secondary | ICD-10-CM | POA: Diagnosis not present

## 2019-09-08 DIAGNOSIS — I110 Hypertensive heart disease with heart failure: Secondary | ICD-10-CM | POA: Diagnosis not present

## 2019-09-08 DIAGNOSIS — G4730 Sleep apnea, unspecified: Secondary | ICD-10-CM | POA: Diagnosis not present

## 2019-09-08 DIAGNOSIS — J432 Centrilobular emphysema: Secondary | ICD-10-CM | POA: Diagnosis not present

## 2019-09-08 DIAGNOSIS — I50812 Chronic right heart failure: Secondary | ICD-10-CM | POA: Diagnosis not present

## 2019-09-08 DIAGNOSIS — Z6836 Body mass index (BMI) 36.0-36.9, adult: Secondary | ICD-10-CM

## 2019-09-08 DIAGNOSIS — Z7982 Long term (current) use of aspirin: Secondary | ICD-10-CM

## 2019-09-08 DIAGNOSIS — Z87891 Personal history of nicotine dependence: Secondary | ICD-10-CM

## 2019-09-08 DIAGNOSIS — R0602 Shortness of breath: Secondary | ICD-10-CM

## 2019-09-08 DIAGNOSIS — I2721 Secondary pulmonary arterial hypertension: Secondary | ICD-10-CM

## 2019-09-08 DIAGNOSIS — E669 Obesity, unspecified: Secondary | ICD-10-CM

## 2019-09-08 MED ORDER — FUROSEMIDE 40 MG TABLET
ORAL_TABLET | Freq: Every day | ORAL | 0 refills | 30 days | Status: CP
Start: 2019-09-08 — End: 2019-09-25

## 2019-09-11 DIAGNOSIS — R0602 Shortness of breath: Secondary | ICD-10-CM

## 2019-09-12 DIAGNOSIS — J449 Chronic obstructive pulmonary disease, unspecified: Secondary | ICD-10-CM | POA: Diagnosis not present

## 2019-09-12 DIAGNOSIS — J45909 Unspecified asthma, uncomplicated: Secondary | ICD-10-CM | POA: Diagnosis not present

## 2019-09-12 DIAGNOSIS — R062 Wheezing: Secondary | ICD-10-CM | POA: Diagnosis not present

## 2019-09-14 DIAGNOSIS — N183 Chronic kidney disease, stage 3 (moderate): Secondary | ICD-10-CM | POA: Diagnosis not present

## 2019-09-14 DIAGNOSIS — R0602 Shortness of breath: Secondary | ICD-10-CM | POA: Diagnosis not present

## 2019-09-14 DIAGNOSIS — E1122 Type 2 diabetes mellitus with diabetic chronic kidney disease: Secondary | ICD-10-CM | POA: Diagnosis not present

## 2019-09-14 DIAGNOSIS — I129 Hypertensive chronic kidney disease with stage 1 through stage 4 chronic kidney disease, or unspecified chronic kidney disease: Secondary | ICD-10-CM | POA: Diagnosis not present

## 2019-09-14 DIAGNOSIS — E876 Hypokalemia: Secondary | ICD-10-CM | POA: Diagnosis not present

## 2019-09-15 ENCOUNTER — Ambulatory Visit: Admit: 2019-09-15 | Discharge: 2019-09-16 | Payer: MEDICARE

## 2019-09-26 ENCOUNTER — Ambulatory Visit: Admit: 2019-09-26 | Discharge: 2019-09-27 | Payer: MEDICARE

## 2019-09-26 DIAGNOSIS — R609 Edema, unspecified: Secondary | ICD-10-CM | POA: Diagnosis not present

## 2019-09-26 DIAGNOSIS — J9612 Chronic respiratory failure with hypercapnia: Secondary | ICD-10-CM | POA: Diagnosis not present

## 2019-09-26 DIAGNOSIS — J449 Chronic obstructive pulmonary disease, unspecified: Secondary | ICD-10-CM | POA: Diagnosis not present

## 2019-09-26 DIAGNOSIS — Z8679 Personal history of other diseases of the circulatory system: Secondary | ICD-10-CM | POA: Diagnosis not present

## 2019-09-26 DIAGNOSIS — E119 Type 2 diabetes mellitus without complications: Secondary | ICD-10-CM | POA: Diagnosis not present

## 2019-09-26 DIAGNOSIS — R600 Localized edema: Secondary | ICD-10-CM | POA: Diagnosis not present

## 2019-09-26 DIAGNOSIS — D649 Anemia, unspecified: Secondary | ICD-10-CM | POA: Diagnosis not present

## 2019-09-26 MED ORDER — FUROSEMIDE 40 MG TABLET
ORAL_TABLET | Freq: Every day | ORAL | 3 refills | 90.00000 days | Status: CP
Start: 2019-09-26 — End: 2019-09-26

## 2019-09-26 MED ORDER — FUROSEMIDE 40 MG TABLET: 40 mg | tablet | Freq: Every day | 3 refills | 90 days | Status: AC

## 2019-10-02 DIAGNOSIS — Z789 Other specified health status: Secondary | ICD-10-CM

## 2019-10-04 DIAGNOSIS — Z7151 Drug abuse counseling and surveillance of drug abuser: Secondary | ICD-10-CM | POA: Diagnosis not present

## 2019-10-04 DIAGNOSIS — Z729 Problem related to lifestyle, unspecified: Secondary | ICD-10-CM | POA: Diagnosis not present

## 2019-10-04 DIAGNOSIS — F17290 Nicotine dependence, other tobacco product, uncomplicated: Secondary | ICD-10-CM | POA: Diagnosis not present

## 2019-10-04 DIAGNOSIS — M5430 Sciatica, unspecified side: Secondary | ICD-10-CM | POA: Diagnosis not present

## 2019-10-04 DIAGNOSIS — G894 Chronic pain syndrome: Secondary | ICD-10-CM | POA: Diagnosis not present

## 2019-10-04 DIAGNOSIS — F1120 Opioid dependence, uncomplicated: Secondary | ICD-10-CM | POA: Diagnosis not present

## 2019-10-04 DIAGNOSIS — E119 Type 2 diabetes mellitus without complications: Secondary | ICD-10-CM | POA: Diagnosis not present

## 2019-10-04 DIAGNOSIS — Z79899 Other long term (current) drug therapy: Secondary | ICD-10-CM | POA: Diagnosis not present

## 2019-10-04 DIAGNOSIS — M5416 Radiculopathy, lumbar region: Secondary | ICD-10-CM | POA: Diagnosis not present

## 2019-10-12 DIAGNOSIS — J449 Chronic obstructive pulmonary disease, unspecified: Secondary | ICD-10-CM | POA: Diagnosis not present

## 2019-10-12 DIAGNOSIS — R062 Wheezing: Secondary | ICD-10-CM | POA: Diagnosis not present

## 2019-10-12 DIAGNOSIS — J45909 Unspecified asthma, uncomplicated: Secondary | ICD-10-CM | POA: Diagnosis not present

## 2019-10-19 DIAGNOSIS — I482 Chronic atrial fibrillation, unspecified: Secondary | ICD-10-CM | POA: Diagnosis not present

## 2019-10-26 DIAGNOSIS — J449 Chronic obstructive pulmonary disease, unspecified: Secondary | ICD-10-CM | POA: Diagnosis not present

## 2019-10-26 DIAGNOSIS — J9612 Chronic respiratory failure with hypercapnia: Secondary | ICD-10-CM | POA: Diagnosis not present

## 2019-11-12 DIAGNOSIS — R062 Wheezing: Secondary | ICD-10-CM | POA: Diagnosis not present

## 2019-11-12 DIAGNOSIS — J45909 Unspecified asthma, uncomplicated: Secondary | ICD-10-CM | POA: Diagnosis not present

## 2019-11-12 DIAGNOSIS — J449 Chronic obstructive pulmonary disease, unspecified: Secondary | ICD-10-CM | POA: Diagnosis not present

## 2019-11-15 ENCOUNTER — Ambulatory Visit: Admit: 2019-11-15 | Discharge: 2019-11-16 | Payer: MEDICARE

## 2019-11-15 DIAGNOSIS — R600 Localized edema: Secondary | ICD-10-CM | POA: Diagnosis not present

## 2019-11-15 DIAGNOSIS — I272 Pulmonary hypertension, unspecified: Secondary | ICD-10-CM | POA: Diagnosis not present

## 2019-11-15 DIAGNOSIS — I480 Paroxysmal atrial fibrillation: Secondary | ICD-10-CM | POA: Diagnosis not present

## 2019-11-15 DIAGNOSIS — R590 Localized enlarged lymph nodes: Secondary | ICD-10-CM | POA: Diagnosis not present

## 2019-11-15 DIAGNOSIS — J449 Chronic obstructive pulmonary disease, unspecified: Secondary | ICD-10-CM | POA: Diagnosis not present

## 2019-11-15 DIAGNOSIS — Z7982 Long term (current) use of aspirin: Secondary | ICD-10-CM | POA: Diagnosis not present

## 2019-11-15 DIAGNOSIS — I50812 Chronic right heart failure: Secondary | ICD-10-CM | POA: Diagnosis not present

## 2019-11-15 DIAGNOSIS — E119 Type 2 diabetes mellitus without complications: Secondary | ICD-10-CM | POA: Diagnosis not present

## 2019-11-15 DIAGNOSIS — I110 Hypertensive heart disease with heart failure: Secondary | ICD-10-CM | POA: Diagnosis not present

## 2019-11-15 DIAGNOSIS — G4733 Obstructive sleep apnea (adult) (pediatric): Secondary | ICD-10-CM | POA: Diagnosis not present

## 2019-11-15 MED ORDER — FUROSEMIDE 40 MG TABLET
ORAL_TABLET | Freq: Every day | ORAL | 3 refills | 90 days | Status: CP
Start: 2019-11-15 — End: ?

## 2019-11-26 DIAGNOSIS — J9612 Chronic respiratory failure with hypercapnia: Secondary | ICD-10-CM | POA: Diagnosis not present

## 2019-11-26 DIAGNOSIS — J449 Chronic obstructive pulmonary disease, unspecified: Secondary | ICD-10-CM | POA: Diagnosis not present

## 2019-11-27 DIAGNOSIS — E6609 Other obesity due to excess calories: Secondary | ICD-10-CM | POA: Diagnosis not present

## 2019-11-27 DIAGNOSIS — I701 Atherosclerosis of renal artery: Secondary | ICD-10-CM | POA: Diagnosis not present

## 2019-11-27 DIAGNOSIS — R0902 Hypoxemia: Secondary | ICD-10-CM | POA: Diagnosis not present

## 2019-11-27 DIAGNOSIS — F1120 Opioid dependence, uncomplicated: Secondary | ICD-10-CM | POA: Diagnosis not present

## 2019-11-27 DIAGNOSIS — Z6838 Body mass index (BMI) 38.0-38.9, adult: Secondary | ICD-10-CM | POA: Diagnosis not present

## 2019-11-27 DIAGNOSIS — G4733 Obstructive sleep apnea (adult) (pediatric): Secondary | ICD-10-CM | POA: Diagnosis not present

## 2019-11-27 DIAGNOSIS — R0600 Dyspnea, unspecified: Secondary | ICD-10-CM | POA: Diagnosis not present

## 2019-11-27 DIAGNOSIS — N289 Disorder of kidney and ureter, unspecified: Secondary | ICD-10-CM | POA: Diagnosis not present

## 2019-11-27 DIAGNOSIS — I270 Primary pulmonary hypertension: Secondary | ICD-10-CM | POA: Diagnosis not present

## 2019-11-27 DIAGNOSIS — Z79899 Other long term (current) drug therapy: Secondary | ICD-10-CM | POA: Diagnosis not present

## 2019-11-27 DIAGNOSIS — J42 Unspecified chronic bronchitis: Secondary | ICD-10-CM | POA: Diagnosis not present

## 2019-11-29 DIAGNOSIS — E119 Type 2 diabetes mellitus without complications: Secondary | ICD-10-CM | POA: Diagnosis not present

## 2019-11-29 DIAGNOSIS — M5416 Radiculopathy, lumbar region: Secondary | ICD-10-CM | POA: Diagnosis not present

## 2019-11-29 DIAGNOSIS — Z729 Problem related to lifestyle, unspecified: Secondary | ICD-10-CM | POA: Diagnosis not present

## 2019-11-29 DIAGNOSIS — F17290 Nicotine dependence, other tobacco product, uncomplicated: Secondary | ICD-10-CM | POA: Diagnosis not present

## 2019-11-29 DIAGNOSIS — G894 Chronic pain syndrome: Secondary | ICD-10-CM | POA: Diagnosis not present

## 2019-11-29 DIAGNOSIS — Z7151 Drug abuse counseling and surveillance of drug abuser: Secondary | ICD-10-CM | POA: Diagnosis not present

## 2019-11-29 DIAGNOSIS — M5430 Sciatica, unspecified side: Secondary | ICD-10-CM | POA: Diagnosis not present

## 2019-11-29 DIAGNOSIS — F1120 Opioid dependence, uncomplicated: Secondary | ICD-10-CM | POA: Diagnosis not present

## 2019-12-12 DIAGNOSIS — R062 Wheezing: Secondary | ICD-10-CM | POA: Diagnosis not present

## 2019-12-12 DIAGNOSIS — J449 Chronic obstructive pulmonary disease, unspecified: Secondary | ICD-10-CM | POA: Diagnosis not present

## 2019-12-12 DIAGNOSIS — J45909 Unspecified asthma, uncomplicated: Secondary | ICD-10-CM | POA: Diagnosis not present

## 2019-12-26 DIAGNOSIS — J449 Chronic obstructive pulmonary disease, unspecified: Secondary | ICD-10-CM | POA: Diagnosis not present

## 2019-12-26 DIAGNOSIS — J9612 Chronic respiratory failure with hypercapnia: Secondary | ICD-10-CM | POA: Diagnosis not present

## 2020-01-02 ENCOUNTER — Telehealth: Admit: 2020-01-02 | Discharge: 2020-01-03 | Payer: MEDICARE

## 2020-01-02 DIAGNOSIS — J449 Chronic obstructive pulmonary disease, unspecified: Secondary | ICD-10-CM | POA: Diagnosis not present

## 2020-01-02 DIAGNOSIS — J9611 Chronic respiratory failure with hypoxia: Secondary | ICD-10-CM | POA: Diagnosis not present

## 2020-01-02 DIAGNOSIS — G4730 Sleep apnea, unspecified: Secondary | ICD-10-CM | POA: Diagnosis not present

## 2020-01-02 DIAGNOSIS — I10 Essential (primary) hypertension: Secondary | ICD-10-CM | POA: Diagnosis not present

## 2020-01-02 DIAGNOSIS — Z8679 Personal history of other diseases of the circulatory system: Secondary | ICD-10-CM | POA: Diagnosis not present

## 2020-01-02 DIAGNOSIS — I50812 Chronic right heart failure: Secondary | ICD-10-CM | POA: Diagnosis not present

## 2020-01-02 DIAGNOSIS — E119 Type 2 diabetes mellitus without complications: Secondary | ICD-10-CM | POA: Diagnosis not present

## 2020-01-12 DIAGNOSIS — J45909 Unspecified asthma, uncomplicated: Secondary | ICD-10-CM | POA: Diagnosis not present

## 2020-01-12 DIAGNOSIS — J449 Chronic obstructive pulmonary disease, unspecified: Secondary | ICD-10-CM | POA: Diagnosis not present

## 2020-01-12 DIAGNOSIS — R062 Wheezing: Secondary | ICD-10-CM | POA: Diagnosis not present

## 2020-01-24 DIAGNOSIS — Z79899 Other long term (current) drug therapy: Secondary | ICD-10-CM | POA: Diagnosis not present

## 2020-01-24 DIAGNOSIS — E119 Type 2 diabetes mellitus without complications: Secondary | ICD-10-CM | POA: Diagnosis not present

## 2020-01-24 DIAGNOSIS — F17290 Nicotine dependence, other tobacco product, uncomplicated: Secondary | ICD-10-CM | POA: Diagnosis not present

## 2020-01-24 DIAGNOSIS — G894 Chronic pain syndrome: Secondary | ICD-10-CM | POA: Diagnosis not present

## 2020-01-24 DIAGNOSIS — Z7151 Drug abuse counseling and surveillance of drug abuser: Secondary | ICD-10-CM | POA: Diagnosis not present

## 2020-01-24 DIAGNOSIS — M5430 Sciatica, unspecified side: Secondary | ICD-10-CM | POA: Diagnosis not present

## 2020-01-24 DIAGNOSIS — F1120 Opioid dependence, uncomplicated: Secondary | ICD-10-CM | POA: Diagnosis not present

## 2020-01-24 DIAGNOSIS — M5416 Radiculopathy, lumbar region: Secondary | ICD-10-CM | POA: Diagnosis not present

## 2020-01-24 DIAGNOSIS — Z729 Problem related to lifestyle, unspecified: Secondary | ICD-10-CM | POA: Diagnosis not present

## 2020-01-26 DIAGNOSIS — J9612 Chronic respiratory failure with hypercapnia: Secondary | ICD-10-CM | POA: Diagnosis not present

## 2020-01-26 DIAGNOSIS — J449 Chronic obstructive pulmonary disease, unspecified: Secondary | ICD-10-CM | POA: Diagnosis not present

## 2020-02-25 DIAGNOSIS — J9612 Chronic respiratory failure with hypercapnia: Secondary | ICD-10-CM | POA: Diagnosis not present

## 2020-02-25 DIAGNOSIS — J449 Chronic obstructive pulmonary disease, unspecified: Secondary | ICD-10-CM | POA: Diagnosis not present

## 2020-02-26 MED ORDER — SPIRIVA WITH HANDIHALER 18 MCG AND INHALATION CAPSULES
ORAL_CAPSULE | Freq: Every day | RESPIRATORY_TRACT | 5 refills | 30 days | Status: CP
Start: 2020-02-26 — End: ?

## 2020-03-01 ENCOUNTER — Telehealth: Admit: 2020-03-01 | Discharge: 2020-03-02 | Payer: MEDICARE

## 2020-03-01 DIAGNOSIS — J449 Chronic obstructive pulmonary disease, unspecified: Secondary | ICD-10-CM | POA: Diagnosis not present

## 2020-03-01 DIAGNOSIS — G4730 Sleep apnea, unspecified: Secondary | ICD-10-CM | POA: Diagnosis not present

## 2020-03-01 DIAGNOSIS — E119 Type 2 diabetes mellitus without complications: Secondary | ICD-10-CM | POA: Diagnosis not present

## 2020-03-01 DIAGNOSIS — I10 Essential (primary) hypertension: Secondary | ICD-10-CM | POA: Diagnosis not present

## 2020-03-01 DIAGNOSIS — J302 Other seasonal allergic rhinitis: Secondary | ICD-10-CM | POA: Diagnosis not present

## 2020-03-01 MED ORDER — MONTELUKAST 10 MG TABLET
ORAL_TABLET | Freq: Every day | ORAL | 3 refills | 90.00000 days | Status: CP
Start: 2020-03-01 — End: ?

## 2020-03-20 DIAGNOSIS — F17290 Nicotine dependence, other tobacco product, uncomplicated: Secondary | ICD-10-CM | POA: Diagnosis not present

## 2020-03-20 DIAGNOSIS — E1122 Type 2 diabetes mellitus with diabetic chronic kidney disease: Secondary | ICD-10-CM | POA: Diagnosis not present

## 2020-03-20 DIAGNOSIS — F1120 Opioid dependence, uncomplicated: Secondary | ICD-10-CM | POA: Diagnosis not present

## 2020-03-20 DIAGNOSIS — Z7151 Drug abuse counseling and surveillance of drug abuser: Secondary | ICD-10-CM | POA: Diagnosis not present

## 2020-03-20 DIAGNOSIS — Z79899 Other long term (current) drug therapy: Secondary | ICD-10-CM | POA: Diagnosis not present

## 2020-03-20 DIAGNOSIS — Z729 Problem related to lifestyle, unspecified: Secondary | ICD-10-CM | POA: Diagnosis not present

## 2020-03-20 DIAGNOSIS — E876 Hypokalemia: Secondary | ICD-10-CM | POA: Diagnosis not present

## 2020-03-20 DIAGNOSIS — I129 Hypertensive chronic kidney disease with stage 1 through stage 4 chronic kidney disease, or unspecified chronic kidney disease: Secondary | ICD-10-CM | POA: Diagnosis not present

## 2020-03-20 DIAGNOSIS — N1832 Chronic kidney disease, stage 3b: Secondary | ICD-10-CM | POA: Diagnosis not present

## 2020-03-20 DIAGNOSIS — M5430 Sciatica, unspecified side: Secondary | ICD-10-CM | POA: Diagnosis not present

## 2020-03-20 DIAGNOSIS — E119 Type 2 diabetes mellitus without complications: Secondary | ICD-10-CM | POA: Diagnosis not present

## 2020-03-20 DIAGNOSIS — M5416 Radiculopathy, lumbar region: Secondary | ICD-10-CM | POA: Diagnosis not present

## 2020-03-20 DIAGNOSIS — D631 Anemia in chronic kidney disease: Secondary | ICD-10-CM | POA: Diagnosis not present

## 2020-03-20 DIAGNOSIS — G894 Chronic pain syndrome: Secondary | ICD-10-CM | POA: Diagnosis not present

## 2020-04-01 DIAGNOSIS — J449 Chronic obstructive pulmonary disease, unspecified: Secondary | ICD-10-CM | POA: Diagnosis not present

## 2020-04-01 DIAGNOSIS — J9612 Chronic respiratory failure with hypercapnia: Secondary | ICD-10-CM | POA: Diagnosis not present

## 2020-04-28 ENCOUNTER — Ambulatory Visit: Admit: 2020-04-28 | Discharge: 2020-05-01 | Payer: MEDICARE

## 2020-05-01 MED ORDER — MELATONIN 3 MG TABLET
ORAL_TABLET | Freq: Every evening | ORAL | 0 refills | 60.00000 days | Status: CP
Start: 2020-05-01 — End: 2020-06-30

## 2020-05-01 MED ORDER — FUROSEMIDE 40 MG TABLET
ORAL_TABLET | Freq: Every day | ORAL | 1 refills | 30.00000 days | Status: CP
Start: 2020-05-01 — End: 2020-06-30

## 2020-05-02 ENCOUNTER — Ambulatory Visit
Admit: 2020-05-02 | Discharge: 2020-05-03 | Payer: MEDICARE | Attending: Student in an Organized Health Care Education/Training Program | Primary: Student in an Organized Health Care Education/Training Program

## 2020-05-02 DIAGNOSIS — N3000 Acute cystitis without hematuria: Principal | ICD-10-CM

## 2020-05-02 DIAGNOSIS — Z09 Encounter for follow-up examination after completed treatment for conditions other than malignant neoplasm: Principal | ICD-10-CM

## 2020-05-02 MED ORDER — CEFDINIR 300 MG CAPSULE
ORAL_CAPSULE | Freq: Two times a day (BID) | ORAL | 0 refills | 7.00000 days | Status: CP
Start: 2020-05-02 — End: 2020-05-09

## 2020-05-10 DIAGNOSIS — I10 Essential (primary) hypertension: Principal | ICD-10-CM

## 2020-05-10 DIAGNOSIS — J410 Simple chronic bronchitis: Principal | ICD-10-CM

## 2020-05-13 ENCOUNTER — Ambulatory Visit: Admit: 2020-05-13 | Discharge: 2020-05-14 | Payer: MEDICARE | Attending: Internal Medicine | Primary: Internal Medicine

## 2020-05-13 DIAGNOSIS — Z09 Encounter for follow-up examination after completed treatment for conditions other than malignant neoplasm: Principal | ICD-10-CM

## 2020-05-13 DIAGNOSIS — D509 Iron deficiency anemia, unspecified: Principal | ICD-10-CM

## 2020-05-13 DIAGNOSIS — I50812 Chronic right heart failure: Principal | ICD-10-CM

## 2020-05-13 DIAGNOSIS — I272 Pulmonary hypertension, unspecified: Principal | ICD-10-CM

## 2020-05-15 DIAGNOSIS — E119 Type 2 diabetes mellitus without complications: Principal | ICD-10-CM

## 2020-05-15 MED ORDER — LANCETS: 1 | each | Freq: Every day | 6 refills | 0 days | Status: AC

## 2020-05-15 MED ORDER — BLOOD SUGAR DIAGNOSTIC STRIPS
Freq: Three times a day (TID) | 6 refills | 34.00000 days | Status: CP
Start: 2020-05-15 — End: 2020-05-15

## 2020-05-15 MED ORDER — BLOOD SUGAR DIAGNOSTIC STRIPS: 1 | each | Freq: Once | 6 refills | 100 days | Status: AC

## 2020-05-15 MED ORDER — LANCETS
Freq: Every day | 6 refills | 0.00000 days | Status: CP
Start: 2020-05-15 — End: 2020-05-15

## 2020-05-30 ENCOUNTER — Ambulatory Visit: Admit: 2020-05-30 | Discharge: 2020-05-31 | Payer: MEDICARE

## 2020-05-30 ENCOUNTER — Ambulatory Visit
Admit: 2020-05-30 | Discharge: 2020-05-31 | Payer: MEDICARE | Attending: Cardiovascular Disease | Primary: Cardiovascular Disease

## 2020-05-30 DIAGNOSIS — I5032 Chronic diastolic (congestive) heart failure: Principal | ICD-10-CM

## 2020-05-30 DIAGNOSIS — E119 Type 2 diabetes mellitus without complications: Principal | ICD-10-CM

## 2020-05-30 DIAGNOSIS — N1831 Stage 3a chronic kidney disease: Principal | ICD-10-CM

## 2020-05-30 DIAGNOSIS — D509 Iron deficiency anemia, unspecified: Principal | ICD-10-CM

## 2020-05-30 DIAGNOSIS — I10 Essential (primary) hypertension: Principal | ICD-10-CM

## 2020-06-17 NOTE — Progress Notes (Signed)
error 

## 2020-06-25 MED ORDER — ALBUTEROL SULFATE HFA 90 MCG/ACTUATION AEROSOL INHALER
RESPIRATORY_TRACT | 11 refills | 0.00000 days | Status: CP | PRN
Start: 2020-06-25 — End: ?

## 2020-06-26 DIAGNOSIS — E119 Type 2 diabetes mellitus without complications: Principal | ICD-10-CM

## 2020-06-26 DIAGNOSIS — I10 Essential (primary) hypertension: Principal | ICD-10-CM

## 2020-06-27 MED ORDER — GABAPENTIN 800 MG TABLET
ORAL_TABLET | Freq: Three times a day (TID) | ORAL | 0 refills | 90.00000 days | Status: CP
Start: 2020-06-27 — End: ?

## 2020-06-28 ENCOUNTER — Ambulatory Visit: Admit: 2020-06-28 | Discharge: 2020-06-29 | Payer: MEDICARE

## 2020-06-28 MED ORDER — FLUTICASONE 250 MCG-SALMETEROL 50 MCG/DOSE BLISTR POWDR FOR INHALATION
Freq: Two times a day (BID) | RESPIRATORY_TRACT | 11 refills | 1 days | Status: CP
Start: 2020-06-28 — End: ?

## 2020-08-22 ENCOUNTER — Ambulatory Visit: Admit: 2020-08-22 | Discharge: 2020-08-24 | Payer: MEDICARE

## 2020-08-23 MED ORDER — APIXABAN 5 MG TABLET
ORAL_TABLET | Freq: Two times a day (BID) | ORAL | 0 refills | 30 days | Status: CP
Start: 2020-08-23 — End: 2020-08-23

## 2020-08-24 MED ORDER — FUROSEMIDE 40 MG TABLET
ORAL_TABLET | Freq: Every day | ORAL | 0 refills | 30 days
Start: 2020-08-24 — End: ?

## 2020-08-24 MED ORDER — APIXABAN 5 MG TABLET
ORAL_TABLET | Freq: Two times a day (BID) | ORAL | 2 refills | 30.00000 days | Status: CP
Start: 2020-08-24 — End: ?

## 2020-08-24 MED ORDER — ATORVASTATIN 40 MG TABLET
ORAL_TABLET | Freq: Every day | ORAL | 2 refills | 30 days | Status: CP
Start: 2020-08-24 — End: ?

## 2020-08-26 ENCOUNTER — Ambulatory Visit: Admit: 2020-08-26 | Discharge: 2020-08-27 | Payer: MEDICARE

## 2020-08-26 DIAGNOSIS — Z09 Encounter for follow-up examination after completed treatment for conditions other than malignant neoplasm: Principal | ICD-10-CM

## 2020-08-27 DIAGNOSIS — I50812 Chronic right heart failure: Principal | ICD-10-CM

## 2020-08-27 DIAGNOSIS — I10 Essential (primary) hypertension: Principal | ICD-10-CM

## 2020-08-27 DIAGNOSIS — I272 Pulmonary hypertension, unspecified: Principal | ICD-10-CM

## 2020-08-30 ENCOUNTER — Ambulatory Visit: Admit: 2020-08-30 | Discharge: 2020-08-31 | Payer: MEDICARE

## 2020-08-30 DIAGNOSIS — Z09 Encounter for follow-up examination after completed treatment for conditions other than malignant neoplasm: Principal | ICD-10-CM

## 2020-08-30 DIAGNOSIS — Z23 Encounter for immunization: Principal | ICD-10-CM

## 2020-08-30 DIAGNOSIS — I10 Essential (primary) hypertension: Principal | ICD-10-CM

## 2020-08-30 DIAGNOSIS — J410 Simple chronic bronchitis: Principal | ICD-10-CM

## 2020-08-30 DIAGNOSIS — N1831 Stage 3a chronic kidney disease: Principal | ICD-10-CM

## 2020-08-30 DIAGNOSIS — E119 Type 2 diabetes mellitus without complications: Principal | ICD-10-CM

## 2020-08-30 DIAGNOSIS — I272 Pulmonary hypertension, unspecified: Principal | ICD-10-CM

## 2020-08-30 DIAGNOSIS — I5032 Chronic diastolic (congestive) heart failure: Principal | ICD-10-CM

## 2020-08-30 MED ORDER — ROSUVASTATIN 5 MG TABLET
ORAL_TABLET | Freq: Every evening | ORAL | 11 refills | 30.00000 days | Status: CP
Start: 2020-08-30 — End: 2021-08-30

## 2020-08-31 MED ORDER — ROSUVASTATIN 5 MG TABLET
ORAL_TABLET | ORAL | 11 refills | 0.00000 days | Status: CP
Start: 2020-08-31 — End: 2021-08-31

## 2020-09-03 ENCOUNTER — Ambulatory Visit: Admit: 2020-09-03 | Discharge: 2020-09-04 | Payer: MEDICARE

## 2020-09-04 ENCOUNTER — Ambulatory Visit
Admit: 2020-09-04 | Discharge: 2020-09-05 | Payer: MEDICARE | Attending: Student in an Organized Health Care Education/Training Program | Primary: Student in an Organized Health Care Education/Training Program

## 2020-09-04 DIAGNOSIS — I5033 Acute on chronic diastolic (congestive) heart failure: Principal | ICD-10-CM

## 2020-09-04 DIAGNOSIS — R0602 Shortness of breath: Principal | ICD-10-CM

## 2020-09-04 DIAGNOSIS — I4811 Longstanding persistent atrial fibrillation: Principal | ICD-10-CM

## 2020-09-04 MED ORDER — METOPROLOL SUCCINATE ER 100 MG TABLET,EXTENDED RELEASE 24 HR
ORAL_TABLET | Freq: Every day | ORAL | 3 refills | 90.00000 days | Status: CP
Start: 2020-09-04 — End: 2021-09-04

## 2020-09-06 ENCOUNTER — Encounter: Admit: 2020-09-06 | Discharge: 2020-09-25 | Payer: MEDICARE

## 2020-09-06 ENCOUNTER — Inpatient Hospital Stay: Admit: 2020-08-31 | Discharge: 2020-09-25 | Payer: MEDICARE

## 2020-09-09 DIAGNOSIS — I10 Essential (primary) hypertension: Principal | ICD-10-CM

## 2020-09-09 DIAGNOSIS — N1831 Stage 3a chronic kidney disease: Principal | ICD-10-CM

## 2020-09-09 DIAGNOSIS — I482 Chronic atrial fibrillation, unspecified: Principal | ICD-10-CM

## 2020-09-09 DIAGNOSIS — I5032 Chronic diastolic (congestive) heart failure: Principal | ICD-10-CM

## 2020-09-10 ENCOUNTER — Ambulatory Visit: Admit: 2020-09-10 | Discharge: 2020-09-10 | Disposition: A | Discharge status: Home / Self Care | Payer: MEDICARE

## 2020-09-10 ENCOUNTER — Emergency Department: Admit: 2020-09-10 | Discharge: 2020-09-10 | Disposition: A | Discharge status: Home / Self Care | Payer: MEDICARE

## 2020-09-10 DIAGNOSIS — I10 Essential (primary) hypertension: Principal | ICD-10-CM

## 2020-09-10 DIAGNOSIS — R0600 Dyspnea, unspecified: Principal | ICD-10-CM

## 2020-09-10 MED ORDER — SPIRIVA WITH HANDIHALER 18 MCG AND INHALATION CAPSULES
ORAL_CAPSULE | Freq: Every day | RESPIRATORY_TRACT | 3 refills | 90.00000 days | Status: CP
Start: 2020-09-10 — End: ?

## 2020-09-12 ENCOUNTER — Institutional Professional Consult (permissible substitution): Admit: 2020-09-12 | Discharge: 2020-09-13 | Payer: MEDICARE

## 2020-09-18 ENCOUNTER — Telehealth: Admit: 2020-09-18 | Discharge: 2020-09-19 | Payer: MEDICARE

## 2020-09-18 DIAGNOSIS — N1831 Stage 3a chronic kidney disease: Principal | ICD-10-CM

## 2020-09-18 DIAGNOSIS — I10 Essential (primary) hypertension: Principal | ICD-10-CM

## 2020-09-18 DIAGNOSIS — I482 Chronic atrial fibrillation, unspecified: Principal | ICD-10-CM

## 2020-09-18 DIAGNOSIS — I4811 Longstanding persistent atrial fibrillation: Principal | ICD-10-CM

## 2020-09-18 MED ORDER — METOPROLOL SUCCINATE ER 100 MG TABLET,EXTENDED RELEASE 24 HR
ORAL_TABLET | Freq: Two times a day (BID) | ORAL | 3 refills | 90 days | Status: CP
Start: 2020-09-18 — End: 2021-09-18

## 2020-09-20 ENCOUNTER — Ambulatory Visit
Admit: 2020-09-20 | Discharge: 2020-09-21 | Payer: MEDICARE | Attending: Student in an Organized Health Care Education/Training Program | Primary: Student in an Organized Health Care Education/Training Program

## 2020-09-20 ENCOUNTER — Telehealth: Admit: 2020-09-20 | Discharge: 2020-09-21 | Payer: MEDICARE

## 2020-09-23 ENCOUNTER — Ambulatory Visit: Admit: 2020-09-23 | Discharge: 2020-09-24 | Payer: MEDICARE

## 2020-09-23 MED ORDER — FUROSEMIDE 40 MG TABLET
ORAL_TABLET | Freq: Every day | ORAL | 0 refills | 45.00000 days
Start: 2020-09-23 — End: ?

## 2020-09-24 DIAGNOSIS — N1831 Stage 3a chronic kidney disease: Principal | ICD-10-CM

## 2020-09-24 MED ORDER — FUROSEMIDE 40 MG TABLET
ORAL_TABLET | Freq: Every day | ORAL | 3 refills | 45.00000 days | Status: CP
Start: 2020-09-24 — End: ?

## 2020-09-24 MED ORDER — IRBESARTAN 75 MG TABLET
ORAL_TABLET | Freq: Every day | ORAL | 3 refills | 90 days | Status: CP
Start: 2020-09-24 — End: ?

## 2020-09-26 DIAGNOSIS — N1831 Stage 3a chronic kidney disease: Principal | ICD-10-CM

## 2020-09-26 DIAGNOSIS — R600 Localized edema: Principal | ICD-10-CM

## 2020-09-26 DIAGNOSIS — I5032 Chronic diastolic (congestive) heart failure: Principal | ICD-10-CM

## 2020-09-27 DIAGNOSIS — I482 Chronic atrial fibrillation, unspecified: Principal | ICD-10-CM

## 2020-09-27 DIAGNOSIS — N1831 Stage 3a chronic kidney disease: Principal | ICD-10-CM

## 2020-09-27 DIAGNOSIS — I5032 Chronic diastolic (congestive) heart failure: Principal | ICD-10-CM

## 2020-09-27 DIAGNOSIS — I10 Essential (primary) hypertension: Principal | ICD-10-CM

## 2020-09-28 ENCOUNTER — Ambulatory Visit
Admit: 2020-09-28 | Discharge: 2020-10-11 | Disposition: A | Discharge status: Home Health | Payer: MEDICARE | Admitting: Cardiovascular Disease

## 2020-09-28 ENCOUNTER — Ambulatory Visit
Admit: 2020-09-28 | Discharge: 2020-10-11 | Disposition: A | Discharge status: Home Health | Payer: MEDICARE | Attending: Student in an Organized Health Care Education/Training Program | Admitting: Cardiovascular Disease | Primary: Student in an Organized Health Care Education/Training Program

## 2020-09-28 ENCOUNTER — Encounter
Admit: 2020-09-28 | Discharge: 2020-10-11 | Disposition: A | Discharge status: Home Health | Payer: MEDICARE | Attending: Anesthesiology | Admitting: Cardiovascular Disease

## 2020-09-28 ENCOUNTER — Encounter
Admit: 2020-09-28 | Discharge: 2020-10-11 | Disposition: A | Discharge status: Home Health | Payer: MEDICARE | Admitting: Cardiovascular Disease

## 2020-10-03 MED ORDER — DAPAGLIFLOZIN 10 MG TABLET
ORAL_TABLET | Freq: Every day | ORAL | 0 refills | 30.00000 days
Start: 2020-10-03 — End: 2020-10-03

## 2020-10-03 MED ORDER — JARDIANCE 10 MG TABLET
ORAL_TABLET | Freq: Every day | ORAL | 0 refills | 30 days
Start: 2020-10-03 — End: 2020-10-03

## 2020-10-11 MED ORDER — EMPAGLIFLOZIN 10 MG TABLET
ORAL_TABLET | Freq: Every day | ORAL | 0 refills | 30.00000 days | Status: CP
Start: 2020-10-11 — End: ?

## 2020-10-11 MED ORDER — FUROSEMIDE 40 MG TABLET
ORAL_TABLET | Freq: Two times a day (BID) | ORAL | 0 refills | 30 days | Status: CP
Start: 2020-10-11 — End: 2020-11-10

## 2020-10-11 MED ORDER — CARVEDILOL 12.5 MG TABLET
ORAL_TABLET | Freq: Two times a day (BID) | ORAL | 0 refills | 30 days | Status: CP
Start: 2020-10-11 — End: 2020-11-10

## 2020-10-14 DIAGNOSIS — Z09 Encounter for follow-up examination after completed treatment for conditions other than malignant neoplasm: Principal | ICD-10-CM

## 2020-10-17 ENCOUNTER — Ambulatory Visit: Admit: 2020-10-17 | Discharge: 2020-10-18 | Payer: MEDICARE

## 2020-10-17 DIAGNOSIS — E119 Type 2 diabetes mellitus without complications: Principal | ICD-10-CM

## 2020-10-17 DIAGNOSIS — Z09 Encounter for follow-up examination after completed treatment for conditions other than malignant neoplasm: Principal | ICD-10-CM

## 2020-10-17 DIAGNOSIS — I5032 Chronic diastolic (congestive) heart failure: Principal | ICD-10-CM

## 2020-10-17 DIAGNOSIS — I482 Chronic atrial fibrillation, unspecified: Principal | ICD-10-CM

## 2020-10-17 DIAGNOSIS — Z9049 Acquired absence of other specified parts of digestive tract: Principal | ICD-10-CM

## 2020-10-17 DIAGNOSIS — N1831 Stage 3a chronic kidney disease (CMS-HCC): Principal | ICD-10-CM

## 2020-10-17 DIAGNOSIS — M47817 Spondylosis without myelopathy or radiculopathy, lumbosacral region: Principal | ICD-10-CM

## 2020-10-17 DIAGNOSIS — R0602 Shortness of breath: Principal | ICD-10-CM

## 2020-10-17 MED ORDER — METOPROLOL SUCCINATE ER 25 MG TABLET,EXTENDED RELEASE 24 HR
ORAL_TABLET | Freq: Every day | ORAL | 11 refills | 30.00000 days | Status: CP
Start: 2020-10-17 — End: 2021-10-17

## 2020-10-17 MED ORDER — GABAPENTIN 800 MG TABLET
ORAL_TABLET | Freq: Three times a day (TID) | ORAL | 3 refills | 90 days | Status: CP
Start: 2020-10-17 — End: ?

## 2020-10-17 MED ORDER — METFORMIN ER 500 MG TABLET,EXTENDED RELEASE 24 HR
ORAL_TABLET | Freq: Every day | ORAL | 3 refills | 90.00000 days | Status: CP
Start: 2020-10-17 — End: 2021-10-17

## 2020-10-18 ENCOUNTER — Ambulatory Visit: Admit: 2020-10-18 | Discharge: 2020-10-19 | Payer: MEDICARE | Attending: Trauma Surgery | Primary: Trauma Surgery

## 2020-10-21 MED ORDER — FUROSEMIDE 20 MG TABLET
Freq: Every day | ORAL | 0 days
Start: 2020-10-21 — End: 2020-10-30

## 2020-10-30 ENCOUNTER — Ambulatory Visit: Admit: 2020-10-30 | Discharge: 2020-10-31 | Payer: MEDICARE | Attending: Adult Health | Primary: Adult Health

## 2020-10-30 ENCOUNTER — Telehealth: Admit: 2020-10-30 | Discharge: 2020-10-31 | Payer: MEDICARE

## 2020-10-30 DIAGNOSIS — I5032 Chronic diastolic (congestive) heart failure: Principal | ICD-10-CM

## 2020-10-30 DIAGNOSIS — E119 Type 2 diabetes mellitus without complications: Principal | ICD-10-CM

## 2020-10-30 DIAGNOSIS — R601 Generalized edema: Principal | ICD-10-CM

## 2020-10-30 DIAGNOSIS — I10 Essential (primary) hypertension: Principal | ICD-10-CM

## 2020-10-30 DIAGNOSIS — I482 Chronic atrial fibrillation, unspecified: Principal | ICD-10-CM

## 2020-10-30 DIAGNOSIS — J9611 Chronic respiratory failure with hypoxia: Principal | ICD-10-CM

## 2020-10-30 DIAGNOSIS — I4819 Other persistent atrial fibrillation: Principal | ICD-10-CM

## 2020-10-30 DIAGNOSIS — N1831 Stage 3a chronic kidney disease (CMS-HCC): Principal | ICD-10-CM

## 2020-10-30 DIAGNOSIS — E1122 Type 2 diabetes mellitus with diabetic chronic kidney disease: Principal | ICD-10-CM

## 2020-10-30 MED ORDER — EMPAGLIFLOZIN 10 MG TABLET
ORAL_TABLET | Freq: Every day | ORAL | 3 refills | 90.00000 days | Status: CP
Start: 2020-10-30 — End: 2021-10-30

## 2020-10-30 MED ORDER — METOPROLOL SUCCINATE ER 25 MG TABLET,EXTENDED RELEASE 24 HR
ORAL_TABLET | Freq: Every day | ORAL | 11 refills | 30 days | Status: CP
Start: 2020-10-30 — End: 2020-11-14

## 2020-10-30 MED ORDER — FUROSEMIDE 40 MG TABLET
ORAL_TABLET | Freq: Every day | ORAL | 3 refills | 90.00000 days
Start: 2020-10-30 — End: 2020-11-14

## 2020-11-14 ENCOUNTER — Ambulatory Visit: Admit: 2020-11-14 | Discharge: 2020-11-15 | Payer: MEDICARE | Attending: Adult Health | Primary: Adult Health

## 2020-11-14 DIAGNOSIS — I50812 Chronic right heart failure: Principal | ICD-10-CM

## 2020-11-14 DIAGNOSIS — I5032 Chronic diastolic (congestive) heart failure: Principal | ICD-10-CM

## 2020-11-14 DIAGNOSIS — I4819 Other persistent atrial fibrillation: Principal | ICD-10-CM

## 2020-11-14 MED ORDER — FUROSEMIDE 40 MG TABLET
ORAL_TABLET | Freq: Every day | ORAL | 3 refills | 90.00000 days
Start: 2020-11-14 — End: 2021-11-14

## 2020-11-14 MED ORDER — AMIODARONE 200 MG TABLET
ORAL_TABLET | 1 refills | 0 days | Status: CP
Start: 2020-11-14 — End: 2020-11-14

## 2020-11-14 MED ORDER — METOPROLOL SUCCINATE ER 25 MG TABLET,EXTENDED RELEASE 24 HR
ORAL_TABLET | Freq: Every day | ORAL | 11 refills | 30.00000 days
Start: 2020-11-14 — End: 2020-12-16

## 2020-11-25 ENCOUNTER — Ambulatory Visit: Admit: 2020-11-25 | Discharge: 2020-12-08 | Payer: MEDICARE

## 2020-11-25 DIAGNOSIS — I4819 Other persistent atrial fibrillation: Principal | ICD-10-CM

## 2020-11-25 DIAGNOSIS — R590 Localized enlarged lymph nodes: Principal | ICD-10-CM

## 2020-11-25 DIAGNOSIS — I50812 Chronic right heart failure: Principal | ICD-10-CM

## 2020-11-25 DIAGNOSIS — I5032 Chronic diastolic (congestive) heart failure: Principal | ICD-10-CM

## 2020-11-26 ENCOUNTER — Ambulatory Visit: Admit: 2020-11-26 | Discharge: 2020-11-27 | Payer: MEDICARE

## 2020-11-26 DIAGNOSIS — M25532 Pain in left wrist: Principal | ICD-10-CM

## 2020-11-26 DIAGNOSIS — M19039 Primary osteoarthritis, unspecified wrist: Principal | ICD-10-CM

## 2020-11-26 DIAGNOSIS — M1812 Unilateral primary osteoarthritis of first carpometacarpal joint, left hand: Principal | ICD-10-CM

## 2020-11-29 ENCOUNTER — Ambulatory Visit: Admit: 2020-11-29 | Discharge: 2020-11-29 | Payer: MEDICARE

## 2020-11-29 DIAGNOSIS — I43 Cardiomyopathy in diseases classified elsewhere: Principal | ICD-10-CM

## 2020-11-29 DIAGNOSIS — E854 Organ-limited amyloidosis: Principal | ICD-10-CM

## 2020-11-29 DIAGNOSIS — I5032 Chronic diastolic (congestive) heart failure: Principal | ICD-10-CM

## 2020-11-29 DIAGNOSIS — I509 Heart failure, unspecified: Principal | ICD-10-CM

## 2020-12-12 ENCOUNTER — Ambulatory Visit: Admit: 2020-12-12 | Discharge: 2020-12-12 | Payer: MEDICARE

## 2020-12-12 DIAGNOSIS — Z1159 Encounter for screening for other viral diseases: Principal | ICD-10-CM

## 2020-12-12 DIAGNOSIS — I4819 Other persistent atrial fibrillation: Principal | ICD-10-CM

## 2020-12-12 DIAGNOSIS — Z7289 Other problems related to lifestyle: Principal | ICD-10-CM

## 2020-12-12 DIAGNOSIS — N1831 Stage 3a chronic kidney disease (CMS-HCC): Principal | ICD-10-CM

## 2020-12-12 DIAGNOSIS — E854 Organ-limited amyloidosis: Principal | ICD-10-CM

## 2020-12-12 DIAGNOSIS — E119 Type 2 diabetes mellitus without complications: Principal | ICD-10-CM

## 2020-12-12 DIAGNOSIS — I5032 Chronic diastolic (congestive) heart failure: Principal | ICD-10-CM

## 2020-12-12 DIAGNOSIS — I43 Cardiomyopathy in diseases classified elsewhere: Principal | ICD-10-CM

## 2020-12-16 DIAGNOSIS — I5032 Chronic diastolic (congestive) heart failure: Principal | ICD-10-CM

## 2020-12-16 MED ORDER — METOPROLOL SUCCINATE ER 25 MG TABLET,EXTENDED RELEASE 24 HR
ORAL_TABLET | Freq: Every day | ORAL | 11 refills | 30.00000 days
Start: 2020-12-16 — End: 2020-12-18

## 2020-12-16 MED ORDER — TORSEMIDE 20 MG TABLET
ORAL_TABLET | Freq: Every day | ORAL | 3 refills | 30.00000 days | Status: CP
Start: 2020-12-16 — End: 2021-01-01

## 2020-12-16 MED ORDER — TORSEMIDE 10 MG TABLET
ORAL_TABLET | Freq: Every day | ORAL | 3 refills | 5.00000 days | Status: CP
Start: 2020-12-16 — End: 2020-12-16

## 2020-12-17 DIAGNOSIS — I509 Heart failure, unspecified: Principal | ICD-10-CM

## 2020-12-18 DIAGNOSIS — I5032 Chronic diastolic (congestive) heart failure: Principal | ICD-10-CM

## 2020-12-18 MED ORDER — METOPROLOL SUCCINATE ER 25 MG TABLET,EXTENDED RELEASE 24 HR
ORAL_TABLET | Freq: Every day | ORAL | 0 refills | 30.00000 days | Status: CP
Start: 2020-12-18 — End: 2021-01-01

## 2020-12-19 MED ORDER — METOPROLOL SUCCINATE ER 25 MG TABLET,EXTENDED RELEASE 24 HR
ORAL_TABLET | Freq: Every day | ORAL | 11 refills | 30.00000 days
Start: 2020-12-19 — End: 2021-12-19

## 2020-12-23 ENCOUNTER — Ambulatory Visit: Admit: 2020-12-23 | Discharge: 2020-12-24 | Payer: MEDICARE

## 2020-12-23 DIAGNOSIS — E854 Organ-limited amyloidosis: Principal | ICD-10-CM

## 2020-12-23 DIAGNOSIS — I509 Heart failure, unspecified: Principal | ICD-10-CM

## 2020-12-23 DIAGNOSIS — I43 Cardiomyopathy in diseases classified elsewhere: Principal | ICD-10-CM

## 2021-01-01 DIAGNOSIS — I429 Cardiomyopathy, unspecified: Principal | ICD-10-CM

## 2021-01-01 DIAGNOSIS — N390 Urinary tract infection, site not specified: Principal | ICD-10-CM

## 2021-01-01 MED ORDER — TORSEMIDE 20 MG TABLET
ORAL_TABLET | Freq: Two times a day (BID) | ORAL | 11 refills | 30.00000 days | Status: SS
Start: 2021-01-01 — End: 2021-01-11

## 2021-01-01 MED ORDER — METOPROLOL SUCCINATE ER 25 MG TABLET,EXTENDED RELEASE 24 HR
ORAL_TABLET | Freq: Every day | ORAL | 3 refills | 90.00000 days | Status: CP
Start: 2021-01-01 — End: 2021-01-03

## 2021-01-02 ENCOUNTER — Telehealth: Admit: 2021-01-02 | Discharge: 2021-01-03 | Payer: MEDICARE

## 2021-01-02 DIAGNOSIS — I43 Cardiomyopathy in diseases classified elsewhere: Principal | ICD-10-CM

## 2021-01-02 DIAGNOSIS — E1122 Type 2 diabetes mellitus with diabetic chronic kidney disease: Principal | ICD-10-CM

## 2021-01-02 DIAGNOSIS — R6883 Chills (without fever): Principal | ICD-10-CM

## 2021-01-02 DIAGNOSIS — E854 Organ-limited amyloidosis: Principal | ICD-10-CM

## 2021-01-02 DIAGNOSIS — N1832 Type 2 diabetes mellitus with stage 3b chronic kidney disease, without long-term current use of insulin (CMS-HCC): Principal | ICD-10-CM

## 2021-01-03 ENCOUNTER — Ambulatory Visit
Admit: 2021-01-03 | Discharge: 2021-01-03 | Payer: MEDICARE | Attending: Cardiovascular Disease | Primary: Cardiovascular Disease

## 2021-01-03 ENCOUNTER — Ambulatory Visit: Admit: 2021-01-03 | Discharge: 2021-01-03 | Payer: MEDICARE

## 2021-01-03 DIAGNOSIS — E854 Organ-limited amyloidosis: Principal | ICD-10-CM

## 2021-01-03 DIAGNOSIS — I429 Cardiomyopathy, unspecified: Principal | ICD-10-CM

## 2021-01-03 DIAGNOSIS — I43 Cardiomyopathy in diseases classified elsewhere: Principal | ICD-10-CM

## 2021-01-03 DIAGNOSIS — R6883 Chills (without fever): Principal | ICD-10-CM

## 2021-01-03 MED ORDER — METOPROLOL SUCCINATE ER 25 MG TABLET,EXTENDED RELEASE 24 HR
ORAL_TABLET | Freq: Every day | ORAL | 3 refills | 90.00000 days | Status: CP
Start: 2021-01-03 — End: 2021-01-11

## 2021-01-03 MED ORDER — SPIRONOLACTONE 25 MG TABLET
ORAL_TABLET | Freq: Every day | ORAL | 6 refills | 30 days | Status: CP
Start: 2021-01-03 — End: 2021-01-11

## 2021-01-03 MED ORDER — TAFAMIDIS 61 MG CAPSULE
ORAL_CAPSULE | Freq: Every day | ORAL | 11 refills | 0.00000 days | Status: CP
Start: 2021-01-03 — End: 2021-01-03
  Filled 2021-01-29: qty 30, 30d supply, fill #0

## 2021-01-03 MED ORDER — AMIODARONE 200 MG TABLET
ORAL_TABLET | Freq: Every day | ORAL | 11 refills | 30.00000 days | Status: CP
Start: 2021-01-03 — End: 2021-02-02

## 2021-01-04 DIAGNOSIS — N3090 Cystitis, unspecified without hematuria: Principal | ICD-10-CM

## 2021-01-04 MED ORDER — FOSFOMYCIN TROMETHAMINE 3 GRAM ORAL PACKET
Freq: Once | ORAL | 0 refills | 1.00000 days | Status: CP
Start: 2021-01-04 — End: 2021-01-04

## 2021-01-05 DIAGNOSIS — I272 Pulmonary hypertension, unspecified: Principal | ICD-10-CM

## 2021-01-05 DIAGNOSIS — N179 Acute kidney failure, unspecified: Principal | ICD-10-CM

## 2021-01-05 DIAGNOSIS — E1165 Type 2 diabetes mellitus with hyperglycemia: Principal | ICD-10-CM

## 2021-01-05 DIAGNOSIS — E871 Hypo-osmolality and hyponatremia: Principal | ICD-10-CM

## 2021-01-05 DIAGNOSIS — J9611 Chronic respiratory failure with hypoxia: Principal | ICD-10-CM

## 2021-01-05 DIAGNOSIS — E854 Organ-limited amyloidosis: Principal | ICD-10-CM

## 2021-01-05 DIAGNOSIS — Z87891 Personal history of nicotine dependence: Principal | ICD-10-CM

## 2021-01-05 DIAGNOSIS — E1142 Type 2 diabetes mellitus with diabetic polyneuropathy: Principal | ICD-10-CM

## 2021-01-05 DIAGNOSIS — Z7951 Long term (current) use of inhaled steroids: Principal | ICD-10-CM

## 2021-01-05 DIAGNOSIS — R778 Other specified abnormalities of plasma proteins: Principal | ICD-10-CM

## 2021-01-05 DIAGNOSIS — M48061 Spinal stenosis, lumbar region without neurogenic claudication: Principal | ICD-10-CM

## 2021-01-05 DIAGNOSIS — N390 Urinary tract infection, site not specified: Principal | ICD-10-CM

## 2021-01-05 DIAGNOSIS — Z79899 Other long term (current) drug therapy: Principal | ICD-10-CM

## 2021-01-05 DIAGNOSIS — J449 Chronic obstructive pulmonary disease, unspecified: Principal | ICD-10-CM

## 2021-01-05 DIAGNOSIS — Z800 Family history of malignant neoplasm of digestive organs: Principal | ICD-10-CM

## 2021-01-05 DIAGNOSIS — Z20822 Contact with and (suspected) exposure to covid-19: Principal | ICD-10-CM

## 2021-01-05 DIAGNOSIS — K219 Gastro-esophageal reflux disease without esophagitis: Principal | ICD-10-CM

## 2021-01-05 DIAGNOSIS — G4733 Obstructive sleep apnea (adult) (pediatric): Principal | ICD-10-CM

## 2021-01-05 DIAGNOSIS — N1832 Chronic kidney disease, stage 3b: Principal | ICD-10-CM

## 2021-01-05 DIAGNOSIS — I50812 Chronic right heart failure: Principal | ICD-10-CM

## 2021-01-05 DIAGNOSIS — I43 Cardiomyopathy in diseases classified elsewhere: Principal | ICD-10-CM

## 2021-01-05 DIAGNOSIS — Z7984 Long term (current) use of oral hypoglycemic drugs: Principal | ICD-10-CM

## 2021-01-05 DIAGNOSIS — I482 Chronic atrial fibrillation, unspecified: Principal | ICD-10-CM

## 2021-01-05 DIAGNOSIS — R531 Weakness: Principal | ICD-10-CM

## 2021-01-05 DIAGNOSIS — I5033 Acute on chronic diastolic (congestive) heart failure: Principal | ICD-10-CM

## 2021-01-05 DIAGNOSIS — D631 Anemia in chronic kidney disease: Principal | ICD-10-CM

## 2021-01-05 DIAGNOSIS — I129 Hypertensive chronic kidney disease with stage 1 through stage 4 chronic kidney disease, or unspecified chronic kidney disease: Principal | ICD-10-CM

## 2021-01-05 DIAGNOSIS — E8770 Fluid overload, unspecified: Principal | ICD-10-CM

## 2021-01-05 DIAGNOSIS — I200 Unstable angina: Principal | ICD-10-CM

## 2021-01-05 DIAGNOSIS — Z7901 Long term (current) use of anticoagulants: Principal | ICD-10-CM

## 2021-01-05 DIAGNOSIS — E1122 Type 2 diabetes mellitus with diabetic chronic kidney disease: Principal | ICD-10-CM

## 2021-01-06 MED ORDER — SPIRONOLACTONE 25 MG TABLET
ORAL_TABLET | Freq: Every day | ORAL | 0 refills | 7.00000 days | Status: SS
Start: 2021-01-06 — End: 2021-01-11

## 2021-01-07 ENCOUNTER — Ambulatory Visit
Admit: 2021-01-07 | Discharge: 2021-01-11 | Disposition: A | Discharge status: Home / Self Care | Payer: MEDICARE | Admitting: Cardiovascular Disease

## 2021-01-07 ENCOUNTER — Encounter
Admit: 2021-01-07 | Discharge: 2021-01-11 | Disposition: A | Discharge status: Home / Self Care | Payer: MEDICARE | Attending: Certified Registered" | Admitting: Cardiovascular Disease

## 2021-01-07 DIAGNOSIS — J9611 Chronic respiratory failure with hypoxia: Principal | ICD-10-CM

## 2021-01-07 DIAGNOSIS — I482 Chronic atrial fibrillation, unspecified: Principal | ICD-10-CM

## 2021-01-07 DIAGNOSIS — N179 Acute kidney failure, unspecified: Principal | ICD-10-CM

## 2021-01-07 DIAGNOSIS — E854 Organ-limited amyloidosis: Principal | ICD-10-CM

## 2021-01-07 DIAGNOSIS — N1832 Chronic kidney disease, stage 3b: Principal | ICD-10-CM

## 2021-01-07 DIAGNOSIS — M48061 Spinal stenosis, lumbar region without neurogenic claudication: Principal | ICD-10-CM

## 2021-01-07 DIAGNOSIS — G4733 Obstructive sleep apnea (adult) (pediatric): Principal | ICD-10-CM

## 2021-01-07 DIAGNOSIS — I43 Cardiomyopathy in diseases classified elsewhere: Principal | ICD-10-CM

## 2021-01-07 DIAGNOSIS — Z800 Family history of malignant neoplasm of digestive organs: Principal | ICD-10-CM

## 2021-01-07 DIAGNOSIS — I5033 Acute on chronic diastolic (congestive) heart failure: Principal | ICD-10-CM

## 2021-01-07 DIAGNOSIS — Z87891 Personal history of nicotine dependence: Principal | ICD-10-CM

## 2021-01-07 DIAGNOSIS — D631 Anemia in chronic kidney disease: Principal | ICD-10-CM

## 2021-01-07 DIAGNOSIS — I50812 Chronic right heart failure: Principal | ICD-10-CM

## 2021-01-07 DIAGNOSIS — E871 Hypo-osmolality and hyponatremia: Principal | ICD-10-CM

## 2021-01-07 DIAGNOSIS — N390 Urinary tract infection, site not specified: Principal | ICD-10-CM

## 2021-01-07 DIAGNOSIS — Z7984 Long term (current) use of oral hypoglycemic drugs: Principal | ICD-10-CM

## 2021-01-07 DIAGNOSIS — Z7951 Long term (current) use of inhaled steroids: Principal | ICD-10-CM

## 2021-01-07 DIAGNOSIS — J449 Chronic obstructive pulmonary disease, unspecified: Principal | ICD-10-CM

## 2021-01-07 DIAGNOSIS — Z79899 Other long term (current) drug therapy: Principal | ICD-10-CM

## 2021-01-07 DIAGNOSIS — I4891 Unspecified atrial fibrillation: Principal | ICD-10-CM

## 2021-01-07 DIAGNOSIS — Z7901 Long term (current) use of anticoagulants: Principal | ICD-10-CM

## 2021-01-07 DIAGNOSIS — Z20822 Contact with and (suspected) exposure to covid-19: Principal | ICD-10-CM

## 2021-01-07 DIAGNOSIS — R778 Other specified abnormalities of plasma proteins: Principal | ICD-10-CM

## 2021-01-07 DIAGNOSIS — I200 Unstable angina: Principal | ICD-10-CM

## 2021-01-07 DIAGNOSIS — I272 Pulmonary hypertension, unspecified: Principal | ICD-10-CM

## 2021-01-07 DIAGNOSIS — E1142 Type 2 diabetes mellitus with diabetic polyneuropathy: Principal | ICD-10-CM

## 2021-01-07 DIAGNOSIS — K219 Gastro-esophageal reflux disease without esophagitis: Principal | ICD-10-CM

## 2021-01-07 DIAGNOSIS — E1122 Type 2 diabetes mellitus with diabetic chronic kidney disease: Principal | ICD-10-CM

## 2021-01-07 DIAGNOSIS — I129 Hypertensive chronic kidney disease with stage 1 through stage 4 chronic kidney disease, or unspecified chronic kidney disease: Principal | ICD-10-CM

## 2021-01-07 DIAGNOSIS — E1165 Type 2 diabetes mellitus with hyperglycemia: Principal | ICD-10-CM

## 2021-01-08 DIAGNOSIS — E854 Organ-limited amyloidosis: Principal | ICD-10-CM

## 2021-01-08 DIAGNOSIS — I43 Cardiomyopathy in diseases classified elsewhere: Principal | ICD-10-CM

## 2021-01-10 DIAGNOSIS — I272 Pulmonary hypertension, unspecified: Principal | ICD-10-CM

## 2021-01-10 DIAGNOSIS — Z7951 Long term (current) use of inhaled steroids: Principal | ICD-10-CM

## 2021-01-10 DIAGNOSIS — Z7984 Long term (current) use of oral hypoglycemic drugs: Principal | ICD-10-CM

## 2021-01-10 DIAGNOSIS — I129 Hypertensive chronic kidney disease with stage 1 through stage 4 chronic kidney disease, or unspecified chronic kidney disease: Principal | ICD-10-CM

## 2021-01-10 DIAGNOSIS — Z20822 Contact with and (suspected) exposure to covid-19: Principal | ICD-10-CM

## 2021-01-10 DIAGNOSIS — M48061 Spinal stenosis, lumbar region without neurogenic claudication: Principal | ICD-10-CM

## 2021-01-10 DIAGNOSIS — E871 Hypo-osmolality and hyponatremia: Principal | ICD-10-CM

## 2021-01-10 DIAGNOSIS — Z79899 Other long term (current) drug therapy: Principal | ICD-10-CM

## 2021-01-10 DIAGNOSIS — E1122 Type 2 diabetes mellitus with diabetic chronic kidney disease: Principal | ICD-10-CM

## 2021-01-10 DIAGNOSIS — I50812 Chronic right heart failure: Principal | ICD-10-CM

## 2021-01-10 DIAGNOSIS — I482 Chronic atrial fibrillation, unspecified: Principal | ICD-10-CM

## 2021-01-10 DIAGNOSIS — J449 Chronic obstructive pulmonary disease, unspecified: Principal | ICD-10-CM

## 2021-01-10 DIAGNOSIS — D631 Anemia in chronic kidney disease: Principal | ICD-10-CM

## 2021-01-10 DIAGNOSIS — N390 Urinary tract infection, site not specified: Principal | ICD-10-CM

## 2021-01-10 DIAGNOSIS — N1832 Chronic kidney disease, stage 3b: Principal | ICD-10-CM

## 2021-01-10 DIAGNOSIS — Z7901 Long term (current) use of anticoagulants: Principal | ICD-10-CM

## 2021-01-10 DIAGNOSIS — Z800 Family history of malignant neoplasm of digestive organs: Principal | ICD-10-CM

## 2021-01-10 DIAGNOSIS — J9611 Chronic respiratory failure with hypoxia: Principal | ICD-10-CM

## 2021-01-10 DIAGNOSIS — E1165 Type 2 diabetes mellitus with hyperglycemia: Principal | ICD-10-CM

## 2021-01-10 DIAGNOSIS — E854 Organ-limited amyloidosis: Principal | ICD-10-CM

## 2021-01-10 DIAGNOSIS — I5033 Acute on chronic diastolic (congestive) heart failure: Principal | ICD-10-CM

## 2021-01-10 DIAGNOSIS — K219 Gastro-esophageal reflux disease without esophagitis: Principal | ICD-10-CM

## 2021-01-10 DIAGNOSIS — Z87891 Personal history of nicotine dependence: Principal | ICD-10-CM

## 2021-01-10 DIAGNOSIS — G4733 Obstructive sleep apnea (adult) (pediatric): Principal | ICD-10-CM

## 2021-01-10 DIAGNOSIS — N179 Acute kidney failure, unspecified: Principal | ICD-10-CM

## 2021-01-10 DIAGNOSIS — E1142 Type 2 diabetes mellitus with diabetic polyneuropathy: Principal | ICD-10-CM

## 2021-01-10 DIAGNOSIS — I43 Cardiomyopathy in diseases classified elsewhere: Principal | ICD-10-CM

## 2021-01-10 DIAGNOSIS — R778 Other specified abnormalities of plasma proteins: Principal | ICD-10-CM

## 2021-01-10 DIAGNOSIS — I200 Unstable angina: Principal | ICD-10-CM

## 2021-01-11 MED ORDER — APIXABAN 5 MG TABLET
ORAL_TABLET | Freq: Two times a day (BID) | ORAL | 11 refills | 30.00000 days | Status: CP
Start: 2021-01-11 — End: ?

## 2021-01-11 MED ORDER — SPIRONOLACTONE 50 MG TABLET
ORAL_TABLET | Freq: Every day | ORAL | 0 refills | 7.00000 days | Status: CP
Start: 2021-01-11 — End: 2021-01-18
  Filled 2021-01-11: qty 7, 7d supply, fill #0

## 2021-01-11 MED ORDER — TORSEMIDE 20 MG TABLET
ORAL_TABLET | Freq: Two times a day (BID) | ORAL | 0 refills | 30.00000 days | Status: CP
Start: 2021-01-11 — End: 2021-02-10
  Filled 2021-01-11: qty 120, 30d supply, fill #0

## 2021-01-14 DIAGNOSIS — Z09 Encounter for follow-up examination after completed treatment for conditions other than malignant neoplasm: Principal | ICD-10-CM

## 2021-01-16 ENCOUNTER — Ambulatory Visit: Admit: 2021-01-16 | Discharge: 2021-01-17 | Payer: MEDICARE | Attending: Adult Health | Primary: Adult Health

## 2021-01-16 DIAGNOSIS — R0902 Hypoxemia: Principal | ICD-10-CM

## 2021-01-16 DIAGNOSIS — N189 Chronic kidney disease, unspecified: Principal | ICD-10-CM

## 2021-01-16 DIAGNOSIS — E1140 Type 2 diabetes mellitus with diabetic neuropathy, unspecified: Principal | ICD-10-CM

## 2021-01-16 DIAGNOSIS — I130 Hypertensive heart and chronic kidney disease with heart failure and stage 1 through stage 4 chronic kidney disease, or unspecified chronic kidney disease: Principal | ICD-10-CM

## 2021-01-16 DIAGNOSIS — Z79899 Other long term (current) drug therapy: Principal | ICD-10-CM

## 2021-01-16 DIAGNOSIS — R9431 Abnormal electrocardiogram [ECG] [EKG]: Principal | ICD-10-CM

## 2021-01-16 DIAGNOSIS — Z87442 Personal history of urinary calculi: Principal | ICD-10-CM

## 2021-01-16 DIAGNOSIS — I482 Chronic atrial fibrillation, unspecified: Principal | ICD-10-CM

## 2021-01-16 DIAGNOSIS — E854 Organ-limited amyloidosis: Principal | ICD-10-CM

## 2021-01-16 DIAGNOSIS — Z9049 Acquired absence of other specified parts of digestive tract: Principal | ICD-10-CM

## 2021-01-16 DIAGNOSIS — I5032 Chronic diastolic (congestive) heart failure: Principal | ICD-10-CM

## 2021-01-16 DIAGNOSIS — J410 Simple chronic bronchitis: Principal | ICD-10-CM

## 2021-01-16 DIAGNOSIS — Z7984 Long term (current) use of oral hypoglycemic drugs: Principal | ICD-10-CM

## 2021-01-16 DIAGNOSIS — E1122 Type 2 diabetes mellitus with diabetic chronic kidney disease: Principal | ICD-10-CM

## 2021-01-16 DIAGNOSIS — I10 Essential (primary) hypertension: Principal | ICD-10-CM

## 2021-01-16 DIAGNOSIS — I43 Cardiomyopathy in diseases classified elsewhere: Principal | ICD-10-CM

## 2021-01-16 DIAGNOSIS — I4819 Other persistent atrial fibrillation: Principal | ICD-10-CM

## 2021-01-16 DIAGNOSIS — Z87891 Personal history of nicotine dependence: Principal | ICD-10-CM

## 2021-01-16 DIAGNOSIS — J449 Chronic obstructive pulmonary disease, unspecified: Principal | ICD-10-CM

## 2021-01-16 DIAGNOSIS — Z7901 Long term (current) use of anticoagulants: Principal | ICD-10-CM

## 2021-01-16 DIAGNOSIS — I5033 Acute on chronic diastolic (congestive) heart failure: Principal | ICD-10-CM

## 2021-01-16 MED ORDER — SPIRONOLACTONE 50 MG TABLET
ORAL_TABLET | Freq: Every day | ORAL | 3 refills | 90 days | Status: CP
Start: 2021-01-16 — End: 2022-01-16

## 2021-01-16 MED ORDER — TORSEMIDE 20 MG TABLET
ORAL_TABLET | Freq: Two times a day (BID) | ORAL | 3 refills | 90.00000 days | Status: CP
Start: 2021-01-16 — End: 2022-01-16

## 2021-01-17 ENCOUNTER — Ambulatory Visit: Admit: 2021-01-17 | Discharge: 2021-01-18 | Payer: MEDICARE

## 2021-01-17 ENCOUNTER — Telehealth: Admit: 2021-01-17 | Discharge: 2021-01-18 | Payer: MEDICARE

## 2021-01-17 DIAGNOSIS — N390 Urinary tract infection, site not specified: Principal | ICD-10-CM

## 2021-01-17 DIAGNOSIS — D509 Iron deficiency anemia, unspecified: Principal | ICD-10-CM

## 2021-01-17 DIAGNOSIS — J9611 Chronic respiratory failure with hypoxia: Principal | ICD-10-CM

## 2021-01-17 DIAGNOSIS — R3915 Urgency of urination: Principal | ICD-10-CM

## 2021-01-17 DIAGNOSIS — I272 Pulmonary hypertension, unspecified: Principal | ICD-10-CM

## 2021-01-17 DIAGNOSIS — G4730 Sleep apnea, unspecified: Principal | ICD-10-CM

## 2021-01-17 DIAGNOSIS — J449 Chronic obstructive pulmonary disease, unspecified: Principal | ICD-10-CM

## 2021-01-17 MED ORDER — TIOTROPIUM BROMIDE 2.5 MCG/ACTUATION MIST FOR INHALATION
Freq: Every day | RESPIRATORY_TRACT | 11 refills | 0 days | Status: CP
Start: 2021-01-17 — End: 2022-01-17

## 2021-01-17 MED ORDER — ADVAIR HFA 115 MCG-21 MCG/ACTUATION AEROSOL INHALER
Freq: Two times a day (BID) | RESPIRATORY_TRACT | 11 refills | 30.00000 days | Status: CP
Start: 2021-01-17 — End: ?

## 2021-01-21 ENCOUNTER — Ambulatory Visit
Admit: 2021-01-21 | Discharge: 2021-01-24 | Disposition: A | Discharge status: Home / Self Care | Payer: MEDICARE | Admitting: Student in an Organized Health Care Education/Training Program

## 2021-01-21 ENCOUNTER — Ambulatory Visit: Admit: 2021-01-21 | Discharge: 2021-01-21 | Payer: MEDICARE

## 2021-01-21 ENCOUNTER — Telehealth: Admit: 2021-01-21 | Discharge: 2021-01-21 | Payer: MEDICARE | Attending: Internal Medicine | Primary: Internal Medicine

## 2021-01-21 DIAGNOSIS — K219 Gastro-esophageal reflux disease without esophagitis: Principal | ICD-10-CM

## 2021-01-21 DIAGNOSIS — B952 Enterococcus as the cause of diseases classified elsewhere: Principal | ICD-10-CM

## 2021-01-21 DIAGNOSIS — G4733 Obstructive sleep apnea (adult) (pediatric): Principal | ICD-10-CM

## 2021-01-21 DIAGNOSIS — E1122 Type 2 diabetes mellitus with diabetic chronic kidney disease: Principal | ICD-10-CM

## 2021-01-21 DIAGNOSIS — I130 Hypertensive heart and chronic kidney disease with heart failure and stage 1 through stage 4 chronic kidney disease, or unspecified chronic kidney disease: Principal | ICD-10-CM

## 2021-01-21 DIAGNOSIS — D509 Iron deficiency anemia, unspecified: Principal | ICD-10-CM

## 2021-01-21 DIAGNOSIS — B9620 Unspecified Escherichia coli [E. coli] as the cause of diseases classified elsewhere: Principal | ICD-10-CM

## 2021-01-21 DIAGNOSIS — R509 Fever, unspecified: Principal | ICD-10-CM

## 2021-01-21 DIAGNOSIS — Z7951 Long term (current) use of inhaled steroids: Principal | ICD-10-CM

## 2021-01-21 DIAGNOSIS — I43 Cardiomyopathy in diseases classified elsewhere: Principal | ICD-10-CM

## 2021-01-21 DIAGNOSIS — I50812 Chronic right heart failure: Principal | ICD-10-CM

## 2021-01-21 DIAGNOSIS — I4891 Unspecified atrial fibrillation: Principal | ICD-10-CM

## 2021-01-21 DIAGNOSIS — Z87891 Personal history of nicotine dependence: Principal | ICD-10-CM

## 2021-01-21 DIAGNOSIS — Z20822 Contact with and (suspected) exposure to covid-19: Principal | ICD-10-CM

## 2021-01-21 DIAGNOSIS — N1832 Chronic kidney disease, stage 3b: Principal | ICD-10-CM

## 2021-01-21 DIAGNOSIS — E854 Organ-limited amyloidosis: Principal | ICD-10-CM

## 2021-01-21 DIAGNOSIS — E1142 Type 2 diabetes mellitus with diabetic polyneuropathy: Principal | ICD-10-CM

## 2021-01-21 DIAGNOSIS — I5032 Chronic diastolic (congestive) heart failure: Principal | ICD-10-CM

## 2021-01-21 DIAGNOSIS — N390 Urinary tract infection, site not specified: Principal | ICD-10-CM

## 2021-01-21 DIAGNOSIS — Z800 Family history of malignant neoplasm of digestive organs: Principal | ICD-10-CM

## 2021-01-21 DIAGNOSIS — J9611 Chronic respiratory failure with hypoxia: Principal | ICD-10-CM

## 2021-01-21 DIAGNOSIS — Z7901 Long term (current) use of anticoagulants: Principal | ICD-10-CM

## 2021-01-21 DIAGNOSIS — Z973 Presence of spectacles and contact lenses: Principal | ICD-10-CM

## 2021-01-21 DIAGNOSIS — J449 Chronic obstructive pulmonary disease, unspecified: Principal | ICD-10-CM

## 2021-01-21 DIAGNOSIS — Z7984 Long term (current) use of oral hypoglycemic drugs: Principal | ICD-10-CM

## 2021-01-22 DIAGNOSIS — I272 Pulmonary hypertension, unspecified: Principal | ICD-10-CM

## 2021-01-23 DIAGNOSIS — K219 Gastro-esophageal reflux disease without esophagitis: Principal | ICD-10-CM

## 2021-01-23 DIAGNOSIS — I5032 Chronic diastolic (congestive) heart failure: Principal | ICD-10-CM

## 2021-01-23 DIAGNOSIS — B952 Enterococcus as the cause of diseases classified elsewhere: Principal | ICD-10-CM

## 2021-01-23 DIAGNOSIS — I130 Hypertensive heart and chronic kidney disease with heart failure and stage 1 through stage 4 chronic kidney disease, or unspecified chronic kidney disease: Principal | ICD-10-CM

## 2021-01-23 DIAGNOSIS — Z7984 Long term (current) use of oral hypoglycemic drugs: Principal | ICD-10-CM

## 2021-01-23 DIAGNOSIS — Z7951 Long term (current) use of inhaled steroids: Principal | ICD-10-CM

## 2021-01-23 DIAGNOSIS — Z973 Presence of spectacles and contact lenses: Principal | ICD-10-CM

## 2021-01-23 DIAGNOSIS — Z20822 Contact with and (suspected) exposure to covid-19: Principal | ICD-10-CM

## 2021-01-23 DIAGNOSIS — E1142 Type 2 diabetes mellitus with diabetic polyneuropathy: Principal | ICD-10-CM

## 2021-01-23 DIAGNOSIS — E1122 Type 2 diabetes mellitus with diabetic chronic kidney disease: Principal | ICD-10-CM

## 2021-01-23 DIAGNOSIS — E854 Organ-limited amyloidosis: Principal | ICD-10-CM

## 2021-01-23 DIAGNOSIS — Z7901 Long term (current) use of anticoagulants: Principal | ICD-10-CM

## 2021-01-23 DIAGNOSIS — Z87891 Personal history of nicotine dependence: Principal | ICD-10-CM

## 2021-01-23 DIAGNOSIS — D509 Iron deficiency anemia, unspecified: Principal | ICD-10-CM

## 2021-01-23 DIAGNOSIS — I4891 Unspecified atrial fibrillation: Principal | ICD-10-CM

## 2021-01-23 DIAGNOSIS — R509 Fever, unspecified: Principal | ICD-10-CM

## 2021-01-23 DIAGNOSIS — J9611 Chronic respiratory failure with hypoxia: Principal | ICD-10-CM

## 2021-01-23 DIAGNOSIS — G4733 Obstructive sleep apnea (adult) (pediatric): Principal | ICD-10-CM

## 2021-01-23 DIAGNOSIS — I43 Cardiomyopathy in diseases classified elsewhere: Principal | ICD-10-CM

## 2021-01-23 DIAGNOSIS — Z800 Family history of malignant neoplasm of digestive organs: Principal | ICD-10-CM

## 2021-01-23 DIAGNOSIS — N1832 Chronic kidney disease, stage 3b: Principal | ICD-10-CM

## 2021-01-23 DIAGNOSIS — I50812 Chronic right heart failure: Principal | ICD-10-CM

## 2021-01-23 DIAGNOSIS — N390 Urinary tract infection, site not specified: Principal | ICD-10-CM

## 2021-01-23 DIAGNOSIS — J449 Chronic obstructive pulmonary disease, unspecified: Principal | ICD-10-CM

## 2021-01-23 DIAGNOSIS — B9620 Unspecified Escherichia coli [E. coli] as the cause of diseases classified elsewhere: Principal | ICD-10-CM

## 2021-01-24 MED ORDER — TORSEMIDE 20 MG TABLET
ORAL_TABLET | Freq: Every day | ORAL | 0 refills | 30 days | Status: CP
Start: 2021-01-24 — End: 2021-02-23

## 2021-01-24 MED ORDER — AMOXICILLIN 500 MG CAPSULE
ORAL_CAPSULE | Freq: Two times a day (BID) | ORAL | 0 refills | 5 days | Status: CP
Start: 2021-01-24 — End: 2021-01-29
  Filled 2021-01-24: qty 20, 5d supply, fill #0

## 2021-01-28 ENCOUNTER — Ambulatory Visit: Admit: 2021-01-28 | Discharge: 2021-01-29 | Payer: MEDICARE | Attending: Family | Primary: Family

## 2021-01-28 DIAGNOSIS — M654 Radial styloid tenosynovitis [de Quervain]: Principal | ICD-10-CM

## 2021-01-28 NOTE — Unmapped (Signed)
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  I am speaking to the patient.    Was a Nurse, learning disability used for this call? No    Verified patient's date of birth / HIPAA.    Specialty medication(s) to be sent: General Specialty: Vyndamax      Non-specialty medications/supplies to be sent: none at this time      Medications not needed at this time: none at this time         Vyndamax (tafamadis)    Medication & Administration     Dosage: Take 1 capsule (61mg ) by mouth once daily    Administration: Take by mouth without regard to meals. Do not crush, chew or break the capsules.    Adherence/Missed dose instructions: Take a missed dose as soon as you remember. If it is close to the time of your next dose, skip the missed dose, and resume your schedule.    Goals of Therapy     Reduce cardiac mortality and cardiac-related hospitalizations    Side Effects & Monitoring Parameters   ??? During clinical trials there were no adverse events that were more prevalent than with placebo    The following side effects should be reported to the provider:  ??? Signs of an allergic reaction    Please call [facility] or the clinic to report any side effects.    Contraindications, Warnings, & Precautions     ??? None    Drug/Food Interactions     Medication list reviewed in Epic. The patient was instructed to inform the care team before taking any new medications or supplements. Per Lexic[employer], Tafamidis may increase the serum concentration of BCRP/ABCG2 Substrates. Severity Moderate. Reliability Rating Good. In a pharmacokinetic study described in tafamidis prescribing information, coadministration of tafamidis (61 mg daily) increased rosuvastatin AUC and Cmax 96.75% and 85.59%, respectively. Monitor for increased adverse effects of BCRP/ABCG2 substrates; dose adjustment may be required.       Storage, Handling Precautions, & Disposal   ??? Vyndamax should be stored at room temperature.      Current Medications (including OTC/herbals), Comorbidities and Allergies     Current Outpatient Medications   Medication Sig Dispense Refill   ??? ACCU-CHEK AVIVA PLUS TEST STRP Strp USE TO CHECK BLOOD SUGAR 3 TIMES A DAY BEFORE MEALS     ??? acetaminophen (TYLENOL) 500 MG tablet Take 1 mg by mouth. 2-3 per week     ??? albuterol HFA 90 mcg/actuation inhaler Inhale 2 puffs every eight (8) hours as needed for wheezing.     ??? amiodarone (PACERONE) 200 MG tablet Take 1 tablet (200 mg total) by mouth daily. 30 tablet 11   ??? amoxicillin (AMOXIL) 500 MG capsule Take 2 capsules (1,000 mg total) by mouth Two (2) times a day for 5 days. 20 capsule 0   ??? apixaban (ELIQUIS) 5 mg Tab Take 1 tablet (5 mg total) by mouth Two (2) times a day. 60 tablet 11   ??? fluticasone propion-salmeteroL (ADVAIR HFA) 115-21 mcg/actuation inhaler Inhale 2 puffs Two (2) times a day. 12 g 11   ??? gabapentin (NEURONTIN) 800 MG tablet Take 1 tablet (800 mg total) by mouth Three (3) times a day. 270 tablet 3   ??? lancets Misc 1 each by Miscellaneous route daily. Accu Check 100 each 6   ??? metFORMIN (GLUCOPHAGE) 500 MG  tablet Take 500 mg by mouth 2 (two) times a day with meals.     ??? montelukast (SINGULAIR) 10 mg tablet Take 1 tablet (10 mg total) by mouth daily. 90 tablet 3   ??? NARCAN 4 mg/actuation nasal spray 1 spray into alternating nostrils once as needed (opioid overdose).      ??? oxyCODONE (OXYCONTIN) 10 mg TR12 12 hr crush resistant ER/CR tablet Take 10 mg by mouth every twelve (12) hours.     ??? OXYGEN-AIR DELIVERY SYSTEMS MISC 5 L by Miscellaneous route.     ??? rosuvastatin (CRESTOR) 5 MG tablet Take 1 tablet (5 mg total) by mouth every other day. 3015 tablet 11   ??? spironolactone (ALDACTONE) 50 MG tablet Take 1 tablet (50 mg total) by mouth daily. 90 tablet 3   ??? tafamidis 61 mg cap Take 1 capsule (61 mg) by mouth daily. 30 capsule 11   ??? tiotropium bromide (SPIRIVA RESPIMAT) 2.5 mcg/actuation inhalation mist Inhale 2 puffs daily. 4 g 11   ??? torsemide (DEMADEX) 20 MG tablet Take 2 tablets (40 mg total) by mouth daily. 60 tablet 0     No current facility-administered medications for this visit.       Allergies   Allergen Reactions   ??? Nitrofurantoin Anaphylaxis and Other (See Comments)   ??? Lipitor [Atorvastatin] Muscle Pain     SOB, Headache, fatigue, sick on my stomach       Patient Active Problem List   Diagnosis   ??? Spinal stenosis of lumbar region   ??? Thoracic or lumbosacral neuritis or radiculitis   ??? Lumbosacral spondylosis   ??? COPD (chronic obstructive pulmonary disease) (CMS-HCC)   ??? Sleep apnea in adult   ??? Essential hypertension   ??? Type 2 diabetes mellitus with stage 3b chronic kidney disease, without long-term current use of insulin (CMS-HCC)   ??? Nephrolithiasis   ??? Chronic cystitis   ??? Incomplete bladder emptying   ??? Nocturia   ??? Chronic atrial fibrillation (CMS-HCC)   ??? Peripheral neuropathy   ??? Chronic respiratory failure with hypoxia (CMS-HCC)   ??? Anemia   ??? Persistent atrial fibrillation (CMS-HCC)   ??? Anemia in chronic kidney disease   ??? Chronic kidney disease, stage III (moderate) (CMS-HCC)   ??? Enrolled in chronic care management   ??? Pulmonary hypertension (CMS-HCC)   ??? Shortness of breath   ??? Chronic prescription opiate use   ??? Dyspnea on exertion   ??? Lower extremity edema   ??? Hyponatremia   ??? Acute kidney injury superimposed on chronic kidney disease (CMS-HCC)   ??? 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 Cardiac amyloidosis (CMS-HCC)   ??? Complicated UTI (urinary tract infection)       Reviewed and up to date in Epic.    Appropriateness of Therapy     Is medication and dose appropriate based on diagnosis? Yes    Prescription has been clinically reviewed: Yes    Baseline Quality of Life Assessment      How many days over the past month did your Cardiac amyloidosis  keep you from your normal activities? For example, brushing your teeth or getting up in the morning. Patient declined to answer    Financial Information     Medication Assistance provided: Prior Authorization    Anticipated copay of $9.85 (30 days) reviewed with patient. Verified delivery address.    Delivery Information     Scheduled delivery date: 01/30/21    Expected  start date: 01/30/21    Medication will be delivered via Next Day Courier to the prescription address in [facility].  This shipment will not require a signature.      Explained the services we provide at [facility] and that each month we would call to set up refills.  Stressed importance of returning phone calls so that we could ensure they receive their medications in time each month.  Informed patient that we should be setting up refills 7-10 days prior to when they will run out of medication.  A pharmacist will reach out to perform a clinical assessment periodically.  Informed patient that a welcome packet and a drug information handout will be sent.      Patient verbalized understanding of the above information as well as how to contact the pharmacy at [phone] option 4 with any questions/concerns.  The pharmacy is open Monday through Friday 8:30am-4:30pm.  A pharmacist is available 24/7 via pager to answer any clinical questions they may have.    Patient Specific Needs     - Does the patient have any physical, cognitive, or cultural barriers? No    - Patient prefers to have medications discussed with  Family Member , daughter    - Is the patient or caregiver able to read and understand education materials at a high school level or above? Yes    - Patient's primary language is  English     - Is the patient high risk? No    - Does the patient require a Care Management Plan? No     - Does the patient require physician intervention or other additional services (i.e. nutrition, smoking cessation, social work)? No      Camillo Flaming  [facility] Specialty Pharmacist

## 2021-01-28 NOTE — Unmapped (Signed)
[facility] Specialty Medication Onboarding    Specialty Medication: Vyndamax 61 mg capsule  Prior Authorization: Approved   Financial Assistance: No - copay  <$25  Final Copay/Day Supply: $9.85 / 30 day supply    Insurance Restrictions: None     Notes to Pharmacist:     The triage team has completed the benefits investigation and has determined that the patient is able to fill this medication at [facility]. Please contact the patient to complete the onboarding or follow up with the prescribing physician as needed.

## 2021-01-29 MED ORDER — METFORMIN 500 MG TABLET
ORAL_TABLET | Freq: Two times a day (BID) | ORAL | 3 refills | 90.00000 days | Status: CP
Start: 2021-01-29 — End: ?

## 2021-01-29 NOTE — Unmapped (Signed)
[facility]  Date: 01/28/2021     Primary Care Physician: Jacquiline Doe, MD          ASSESSMENT:    ICD-10-CM   1. De Quervain's tenosynovitis, left  M65.4       PLAN:    We will proceed with the following treatment plan:  1. Medications: No new medication today.  2. DME/Cast: Wrist thumb spica brace for the next few days to 2 weeks.  He may discontinue if it seems to irritate her or if pain has completely resolved after injection     The encounter diagnosis was [employer] tenosynovitis, left..  The patient was prescribed a wrist thumb spica to be used on their left upper extremity for the purpose of immobilization/support.    3. Injections:   After reviewing risk of injection, she elected proceed with injection today.  A timeout was performed per protocol.  Her skin was cleansed with chlorhexidine using aseptic technique.  First dorsal compartment was injected with Kenalog 10 mg and 1 mL of 1% lidocaine without epinephrine.  The procedure was well-tolerated.  She noted prompt symptomatic improvement    Scheduling Notes:  Return if symptoms worsen or fail to improve.    - X-rays to be ordered at next visit: None     Requested Prescriptions      No prescriptions requested or ordered in this encounter        No orders of the defined types were placed in this encounter.      SUBJECTIVE:  Chief Complaint: Left wrist pain  History of Present Illness:   Callen Witherow is a 80 y.o. female who presents for left wrist pain localized to the radial styloid.  She has had pain for quite some time but pain is worsened over the past few weeks.  She has had multiple difficult medical problems and she would like some relief of her wrist pain.  She has been wearing a cock-up wrist brace without relief of pain.  She localizes all pain to the radial styloid and she had noted some swelling in this area.  Pain Score:   (as reported on intake)        ROS:    .       Pertinent positives and negatives are documented in the HPI. All other systems reviewed are negative. Patient was instructed to follow-up with the appropriate provider as necessary for all pertinent positives not related to today's encounter.    Medical History:  Past Medical History:   Diagnosis Date   ??? Acute on chronic diastolic (congestive) heart failure (CMS-HCC) 08/23/2020   ??? Arthritis    ??? Calculus of kidney    ??? Calculus of ureter    ??? CHF (congestive heart failure) (CMS-HCC)    ??? COPD (chronic obstructive pulmonary disease) (CMS-HCC)    ??? Diabetes (CMS-HCC)    ??? Gangrenous cholecystitis 10/11/2020   ??? GERD (gastroesophageal reflux disease)    ??? Hydronephrosis    ??? Hypertension    ??? Intermediate coronary syndrome (CMS-HCC) 03/13/2014   ??? Lumbar stenosis    ??? Microscopic hematuria    ??? Nausea alone    ??? Neuropathy    ??? Nocturia    ??? Other chronic cystitis    ??? Pulmonary hypertension (CMS-HCC)    ??? Renal colic    ??? Sleep apnea    ??? Unstable angina pectoris (CMS-HCC) 03/13/2014       Surgical History:  Past Surgical History:  Procedure Laterality Date   ??? BACK SURGERY  1995   ??? CARPAL TUNNEL RELEASE Left 2014   ??? HYSTERECTOMY  1971   ??? IR INSERT CHOLECYSTOSMY TUBE PERCUTANEOUS  10/02/2020    IR INSERT CHOLECYSTOSMY TUBE PERCUTANEOUS 10/02/2020 Braulio Conte, MD IMG [facility]   ??? LUMBAR DISC SURGERY     ??? PR REMOVAL GALLBLADDER N/A 10/06/2020    Procedure: CHOLECYSTECTOMY;  Surgeon: Katherina Mires, MD;  Location: MAIN OR [facility];  Service: Trauma   ??? PR RIGHT HEART CATH O2 SATURATION & CARDIAC OUTPUT N/A 09/30/2020    Procedure: Right Heart Catheterization;  Surgeon: Neal Dy, MD;  Location: [facility] CATH;  Service: Cardiology   ??? PR RIGHT HEART CATH O2 SATURATION & CARDIAC OUTPUT N/A 01/09/2021    Procedure: Right Heart Catheterization;  Surgeon: Lesle Reek, MD;  Location: [facility] CATH;  Service: Cardiology       Medications:  ??? ACCU-CHEK AVIVA PLUS TEST STRP Strp USE TO CHECK BLOOD SUGAR 3 TIMES A DAY BEFORE MEALS   ??? acetaminophen (TYLENOL) 500 MG tablet Take 1 mg by mouth. 2-3 per week   ??? albuterol HFA 90 mcg/actuation inhaler Inhale 2 puffs every eight (8) hours as needed for wheezing.   ??? amiodarone (PACERONE) 200 MG tablet Take 1 tablet (200 mg total) by mouth daily.   ??? amoxicillin (AMOXIL) 500 MG capsule Take 2 capsules (1,000 mg total) by mouth Two (2) times a day for 5 days.   ??? apixaban (ELIQUIS) 5 mg Tab Take 1 tablet (5 mg total) by mouth Two (2) times a day.   ??? fluticasone propion-salmeteroL (ADVAIR HFA) 115-21 mcg/actuation inhaler Inhale 2 puffs Two (2) times a day.   ??? gabapentin (NEURONTIN) 800 MG tablet Take 1 tablet (800 mg total) by mouth Three (3) times a day.   ??? lancets Misc 1 each by Miscellaneous route daily. Accu Check   ??? metFORMIN (GLUCOPHAGE) 500 MG tablet Take 500 mg by mouth 2 (two) times a day with meals.   ??? montelukast (SINGULAIR) 10 mg tablet Take 1 tablet (10 mg total) by mouth daily.   ??? NARCAN 4 mg/actuation nasal spray 1 spray into alternating nostrils once as needed (opioid overdose).    ??? oxyCODONE (OXYCONTIN) 10 mg TR12 12 hr crush resistant ER/CR tablet Take 10 mg by mouth every twelve (12) hours.   ??? OXYGEN-AIR DELIVERY SYSTEMS MISC 5 L by Miscellaneous route.   ??? rosuvastatin (CRESTOR) 5 MG tablet Take 1 tablet (5 mg total) by mouth every other day.   ??? spironolactone (ALDACTONE) 50 MG tablet Take 1 tablet (50 mg total) by mouth daily.   ??? tafamidis 61 mg cap Take 1 capsule (61 mg) by mouth daily.   ??? tiotropium bromide (SPIRIVA RESPIMAT) 2.5 mcg/actuation inhalation mist Inhale 2 puffs daily.   ??? torsemide (DEMADEX) 20 MG tablet Take 2 tablets (40 mg total) by mouth daily.       Allergies:  Nitrofurantoin and Lipitor [atorvastatin]    Social History:  Social History     Socioeconomic History   ??? Marital status: Widowed     Spouse name: Not on file   ??? Number of children: Not on file   ??? Years of education: Not on file   ??? Highest education level: Not on file   Occupational History   ??? Occupation: retired Tobacco Use   ??? Smoking status: Former Smoker     Packs/day: 0.33     Types:  Cigarettes   ??? Smokeless tobacco: Never Used   Substance and Sexual Activity   ??? Alcohol use: No   ??? Drug use: No   ??? Sexual activity: Not on file   Other Topics Concern   ??? Exercise No   ??? Living Situation Not Asked   Social History Narrative    Previously worked in Designer, fashion/clothing, retired.  Daughter is Economist who works at [employer].  Her niece is Selena Batten, [employer] Anesthesia Tech.  Lives in [place] with daughter and son-in-law.     Social Determinants of Health     Financial Resource Strain: Low Risk    ??? Difficulty of Paying Living Expenses: Not hard at all   Food Insecurity: No Food Insecurity   ??? Worried About Programme researcher, broadcasting/film/video in the Last Year: Never true   ??? Ran Out of Food in the Last Year: Never true   Transportation Needs: No Transportation Needs   ??? Lack of Transportation (Medical): No   ??? Lack of Transportation (Non-Medical): No   Physical Activity: Not on file   Stress: Not on file   Social Connections: Not on file       Family History:  Family History   Problem Relation Age of Onset   ??? Hypertension Mother    ??? Cancer Father         COLON CANCER   ??? Anesthesia problems Neg Hx    ??? Broken bones Neg Hx    ??? Clotting disorder Neg Hx    ??? Collagen disease Neg Hx    ??? Diabetes Neg Hx    ??? Dislocations Neg Hx    ??? Fibromyalgia Neg Hx    ??? Gout Neg Hx    ??? Hemophilia Neg Hx    ??? Osteoporosis Neg Hx    ??? Rheumatologic disease Neg Hx    ??? Scoliosis Neg Hx    ??? Severe sprains Neg Hx    ??? Sickle cell anemia Neg Hx    ??? Spinal Compression Fracture Neg Hx    ??? GU problems Neg Hx    ??? Kidney cancer Neg Hx    ??? Prostate cancer Neg Hx          OBJECTIVE:  DETAILED PHYSICAL EXAM   General Appearance ?? well-nourished, in no acute distress.  ?? Estimated body mass index is 34.22 kg/m?? as calculated from the following:  ??   Height as of 01/21/21: 167.6 cm (5' 6).  ??   Weight as of 01/24/21: 96.2 kg (212 lb).   Mood and Affect ?? alert, cooperative and pleasant.   Gait  ??  presents in wheelchair   Cardiovascular ?? well-perfused distally and no swelling.   Sensation ?? sensation to light touch distally normal      Left thumb CMC enlargement.  Tenderness palpation at the radial styloid with mild swelling in this area.  Positive Finkelstein's.  Balance of the wrist is nontender.  No pain with CMC grind.  Capillary fill is brisk.  Skin is warm, dry and intact.    Test Results  Imaging:  No new x-rays taken today.  Prior x-rays dated 11/26/2020 are reviewed and show:Osteopenia. No fracture is seen. Alignment is normal. There is mild triscaphe and first CMC narrowing with sclerosis on the left. No osseous erosions. No focal soft tissue swelling.    *This note was created using Scientist, clinical (histocompatibility and immunogenetics). Errors may persist despite proofreading.       Ivor Reining, FNP

## 2021-01-29 NOTE — Unmapped (Addendum)
Thank you for choosing [facility]!  We appreciate the opportunity to participate in your care. Please let us know if we can be of assistance your orthopaedic issues in the future.     If you have questions or concerns, please do not hesitate to contact us by [facility] or by calling [phone] to speak with one of our clinical support sports team members.     [facility] MyChart Website: [email or web address]      Appointment Scheduling: [phone]    Walk-in hours:        [facility]:  Monday - Friday 8 am - 5 pm  [address]  [address] Floor, [address]  [place], [place]  [address]      __________________________________________________________________________    Patient Specific Information: Wrist brace as needed for no longer than 2 weeks.  Please contact me with any questions or problems.    You received a corticosteroid injection to reduce pain and inflammation.  Please note that it can take up to 2 weeks for this injection to fully work.  While many people will feel relief sooner, please be patient.      The injection contained a corticosteroid and a numbing agent.  The numbing agent can last for 1-6 hours.  After this wears off you may have increased pain until the steroid has a chance to work.    What are some of the possible side effects of a steroid injection?    Common side effects:  temporarily elevated blood sugar (in diabetic patients) that can last a few days   flushing of the skin, especially the face  temporary rise in blood pressure  discoloration or atrophy of the skin at the injection site    Call your doctor at once if you have:  persistent worsening pain or swelling, fever;  blurred vision, tunnel vision, eye pain, or seeing halos around lights;  fast or slow heartbeats;  increased blood pressure that is associated with severe headache, blurred vision, pounding in your neck or ears, anxiety, nosebleed;  headaches, ringing in your ears, dizziness, nausea, vision problems, pain behind your eyes    This is not a complete list of side effects and others may occur. Call your provider for medical advice about side effects.     What other drugs may be affected after the injection?  Many drugs can interact with steroids. Not all possible interactions are listed here. Tell your doctor about all your current medicines and any you start or stop using, especially:  an antibiotic or antifungal medication;  birth control pills or hormone replacement therapy;  a blood thinner (warfarin, Coumadin, and others);  a diuretic or water pill;  insulin or oral diabetes medicine;  medicine to treat tuberculosis;  a nonsteroidal anti-inflammatory drug or NSAID (aspirin, ibuprofen, naproxen, diclofenac, indomethacin, Advil, Aleve, Celebrex, and many others); or  seizure medication.      You can resume your normal daily activities, but consider resting the injected area for the next few days.         __________________________________________________________________________

## 2021-01-29 NOTE — Unmapped (Signed)
Refill request for Metformin BID. Chart shows patient is supposed to take Metformin once a day. Routing to Jacquiline Doe, MD

## 2021-02-02 NOTE — Unmapped (Signed)
Patient already enrolled in 30 day program. No contact made.    Casimer Bilis, RN

## 2021-02-02 NOTE — Unmapped (Unsigned)
Internal Medicine Hospital Follow-Up Visit    ASSESSMENT / PLAN:  1. Hospital discharge follow-up    2. Type 2 diabetes mellitus with stage 3b chronic kidney disease, without long-term current use of insulin (CMS-HCC)    3. History of UTI    4. 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)    5. Iron deficiency anemia, unspecified iron deficiency anemia type      Hospital follow-up due to complicated UTI  -stopped jardiance due to recurrent UTI  -BMP today    T2DM  -her FBS were 107-111 without jardiance, her appetite is much improved so will follow BS for now without additional medication changes    Cardiac amyloidosis, TTR amyloid   -RV systolic dysfunction    Iron deficiency anemia  -given IV iron in hospital  -CBC today      Medication adjustments:   1. none      Patient provided with an updated and reconciled medications list.    Follow Up: Return in about 4 weeks (around 03/04/2021).  Future Appointments   Date Time Provider Department Center   02/19/2021  2:30 PM Laurian Brim [facility]   02/21/2021 11:00 AM Zannie Cove, MD [facility]   03/21/2021 11:00 AM Carin Hock, MD [facility]     Medication adherence and barriers to the treatment plan have been addressed. Opportunities to optimize healthy behaviors have been discussed. Patient / caregiver voiced understanding.      CHIEF COMPLAINT/HISTORY OF PRESENT ILLNESS:   Hospital Follow-Up for Other (hosp f/u)    History obtained from: Patient  Date of Hospitalization Discharge:  01/24/21     HPI:  Feeling better.  Did a specimen test for UTI..It's been a while since had an infection; No burning. No fever or sweats.  NO chills since hospitalization.  Breathing at baseline.  No swelling.    Was eating ice a lot but much better.  Before didn't have an appetite.  Feeling better now, and no longer craving ice.      MEDICATIONS AND ALLERGIES:   Reviewed and updated in Epic.  Medication and allergy review was completed by the pharmacist and discussed during this visit. Please see their note within this encounter.    SOCIAL/FAMILY HISTORY:  Social History:  Reviewed in Epic.  Biopsychosocial barriers to care and advanced directives were addressed by the social worker and discussed during the visit. Please see their note within this encounter.    REVIEW OF SYSTEMS:    All other systems reviewed are negative except as noted here or in HPI.    PHYSICAL EXAM  Vitals:    Vitals:    02/04/21 1347   BP: 126/63   Pulse: 86   Temp: 37.2 ??C (99 ??F)   SpO2: 92%       Exam:  General:  NAD  Lungs:  CTA B  Heart:  RRR  Ext:  No c/c/e    Labs:  Reviewed from discharge.  See Epic labs.   Radiology: Reviewed radiology studies from discharge. See in Epic.   Reviewed: Discharge summary and Labs; See results in Epic  Discussed care with: Social worker and Forensic psychologist by phone or other method (Encounter type: Patient Outreach):     Patient Outreach History (Since 01/21/2021)     Cardiology Clinical Assessment     Date Method of Outreach Associated Actions User Next Outreach    01/28/2021  1:02 PM Telephone  Camillo Flaming, PharmD 02/21/2021    01/28/2021 10:23 AM Telephone  Oletta Darter 01/28/2021          Cardiology Refill Coordination     Date Method of Outreach Associated Actions User Next Outreach    01/28/2021  1:02 PM Telephone  Camillo Flaming, PharmD 02/21/2021          Encounter Created In Error     Date Method of Outreach Associated Actions User Next Outreach    02/02/2021  9:51 AM Did Not Contact  Casimer Bilis, RN           Transition of Care     Date Method of Outreach Associated Actions User Next Outreach    01/25/2021  7:22 PM Did Not Contact  Casimer Bilis, RN                Successful contact made or 2 unsuccessful attempts within 2 business days    SUMMARY OF ASSOCIATED HOSPITALIZATION    Admit Date: 01/21/2021  ??  Discharge Date: 01/24/2021   ????  Discharge Diagnoses:  Principal Problem:    Complicated UTI (urinary tract infection) POA: Unknown  Active Problems:    COPD (chronic obstructive pulmonary disease) (CMS-HCC) POA: Yes    Type 2 diabetes mellitus with stage 3b chronic kidney disease, without long-term current use of insulin (CMS-HCC) POA: Yes    Peripheral neuropathy POA: Yes    Anemia POA: Yes    Chronic kidney disease, stage III (moderate) (CMS-HCC) POA: Yes    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) POA: Yes    Cardiac amyloidosis (CMS-HCC) POA: Yes  Resolved Problems:    Chronic right-sided heart failure (CMS-HCC) POA: Yes  ??  Hospital Course:   Marlicia Deringer??is a 80 y.o.??female??with TTR cardiac amyloid, COPD on 3L at baseline, OSA, pHTN, T2DM, [name]3??who after an episode of rigors and fever to 100.7 this morning. On arrival to the ED, patient was febrile 38.3, but otherwise HD stable. Labs notable for leukocytosis to 20.2 and U/A concerning for infection with + LE and >100 WBCs. Chest x-ray not consistent with infection. Blood cultures and urine cultures pending. Given her symptoms and workup thus far, most concerned for infectious process, particularly UTI. Hospital course outlined by problems below  ??  ??  Complicated UTI  Patient presented after an episode of rigors and fever to 100.7. On arrival to the ED, patient was febrile 38.3, but otherwise HD stable. Labs notable for leukocytosis to 20.2 and U/A ??with + LE and >100 WBCs, urine culture has since grown >??100K E coli, >100k  Aeroccocus, and >100k enteroccocus faecalis. She was started on ertapenem based on previous sensitivities. CT abdomen pelvis showed asymmetric urothelial thickening with slight increase stranding of the right renal collecting system.  Blood cultures x2 without growth.  Patient had significant improvement with no further episodes of fevers.  Given that she is significantly improved with ertapenem, which typically does not cover Enterococcus, etiology of her sepsis was presumed to be UTI 2/2 to E. coli or Aerooccocus  She was ultimately received 4 days of ertapenem, and was subsequently transitioned to amoxicillin 1000 mg twice daily for 5 days. This would complete course for E. coli, Enterococcus as well as suspected colonization of Enterococcus.  After discussion with her primary cardiologist, London Pepper was discontinued due to second episodes of UTI since starting this  ??  ??  [name] I TTR Cardiac Amyloid  Follows with Dr. Wonda Cheng and Derwood Kaplan.  Patient??was recently diagnosed with TTR amyloid with positive PYP scan in 10/2020. Last echo??10/2??showed EF >??55%, LV with mild to moderate thickness, mildly dilated LA, RV with reduced systolic function. Currently appears euvolemic, no increased oxygen requirement. Continued on home torsemide 40mg  daily, home spironolactone 50mg  daily  ??  ??  Microcytic Anemia, Iron Deficiency Anemia  Presented with Hgb: 9.6 (MCV: 72), now 9.2. Her hemoglobin has slowly downtrended since October 2021. Iron panel consistent with iron deficiency anemia.  She received 1300 mg of IV iron dextran. (especially stairs) and less endurance to go out of the house for church or other social activities.   ??  Ms. Peckenpaugh lives with her daughter and her daughter's husband. Despite the recurrent hospitalizations and associated complications, she very much enjoys her life. She wishes to continue to seek medical care and treatments to manage her conditions, such as obtaining the new medication for her amyloid.   ??  Thus, her current priorities and goals are in the immediate sense to cure her current urinary tract infection. She would then like to return home and follow up with her regular team of physicians, especially the cardiologist, and give a trial of the new medication for her amyloid. She denies feeling down nor like giving up on obtaining treatment at this time, though does admit to feeling discouraged at times. She is full code and wishes to remain so.

## 2021-02-03 DIAGNOSIS — N1832 Type 2 diabetes mellitus with stage 3b chronic kidney disease, without long-term current use of insulin (CMS-HCC): Principal | ICD-10-CM

## 2021-02-03 DIAGNOSIS — E1122 Type 2 diabetes mellitus with diabetic chronic kidney disease: Principal | ICD-10-CM

## 2021-02-03 DIAGNOSIS — J449 Chronic obstructive pulmonary disease, unspecified: Principal | ICD-10-CM

## 2021-02-03 DIAGNOSIS — I5032 Chronic diastolic (congestive) heart failure: Principal | ICD-10-CM

## 2021-02-04 ENCOUNTER — Ambulatory Visit: Admit: 2021-02-04 | Discharge: 2021-02-05 | Payer: MEDICARE

## 2021-02-04 DIAGNOSIS — D509 Iron deficiency anemia, unspecified: Principal | ICD-10-CM

## 2021-02-04 DIAGNOSIS — Z09 Encounter for follow-up examination after completed treatment for conditions other than malignant neoplasm: Principal | ICD-10-CM

## 2021-02-04 DIAGNOSIS — Z87440 Personal history of urinary (tract) infections: Principal | ICD-10-CM

## 2021-02-04 DIAGNOSIS — I5032 Chronic diastolic (congestive) heart failure: Principal | ICD-10-CM

## 2021-02-04 DIAGNOSIS — E1122 Type 2 diabetes mellitus with diabetic chronic kidney disease: Principal | ICD-10-CM

## 2021-02-04 DIAGNOSIS — N1832 Type 2 diabetes mellitus with stage 3b chronic kidney disease, without long-term current use of insulin (CMS-HCC): Principal | ICD-10-CM

## 2021-02-04 LAB — CBC
HEMATOCRIT: 32.1 % — ABNORMAL LOW (ref 35.0–44.0)
HEMOGLOBIN: 9.4 g/dL — ABNORMAL LOW (ref 12.0–15.5)
MEAN CORPUSCULAR HEMOGLOBIN CONC: 29.2 g/dL — ABNORMAL LOW (ref 30.0–36.0)
MEAN CORPUSCULAR HEMOGLOBIN: 23.2 pg — ABNORMAL LOW (ref 26.0–34.0)
MEAN CORPUSCULAR VOLUME: 79.4 fL — ABNORMAL LOW (ref 82.0–98.0)
MEAN PLATELET VOLUME: 8.9 fL (ref 7.0–10.0)
PLATELET COUNT: 272 10*9/L (ref 150–450)
RED BLOOD CELL COUNT: 4.05 10*12/L (ref 3.90–5.03)
RED CELL DISTRIBUTION WIDTH: 25.9 % — ABNORMAL HIGH (ref 12.0–15.0)
WBC ADJUSTED: 12.5 10*9/L — ABNORMAL HIGH (ref 3.5–10.5)

## 2021-02-04 LAB — URINALYSIS WITH CULTURE REFLEX
BACTERIA: NONE SEEN /HPF
BILIRUBIN UA: NEGATIVE
BLOOD UA: NEGATIVE
GLUCOSE UA: NEGATIVE
KETONES UA: NEGATIVE
LEUKOCYTE ESTERASE UA: NEGATIVE
NITRITE UA: NEGATIVE
PH UA: 7 (ref 5.0–9.0)
PROTEIN UA: NEGATIVE
RBC UA: <1 /HPF (ref 0–3)
SPECIFIC GRAVITY UA: 1.01 (ref 1.005–1.030)
SQUAMOUS EPITHELIAL: 2 /HPF (ref 0–5)
UROBILINOGEN UA: 0.2
WBC UA: <1 /HPF (ref 0–3)

## 2021-02-04 LAB — BASIC METABOLIC PANEL
ANION GAP: 5 mmol/L (ref 5–14)
BLOOD UREA NITROGEN: 32 mg/dL — ABNORMAL HIGH (ref 9–23)
BUN / CREAT RATIO: 23
CALCIUM: 10 mg/dL (ref 8.7–10.4)
CHLORIDE: 102 mmol/L (ref 98–107)
CO2: 32 mmol/L — ABNORMAL HIGH (ref 20.0–31.0)
CREATININE: 1.37 mg/dL — ABNORMAL HIGH
EGFR CKD-EPI AA FEMALE: 42 mL/min/{1.73_m2} — ABNORMAL LOW (ref >=60)
EGFR CKD-EPI NON-AA FEMALE: 37 mL/min/{1.73_m2} — ABNORMAL LOW (ref >=60)
GLUCOSE RANDOM: 166 mg/dL (ref 70–179)
POTASSIUM: 4.6 mmol/L — ABNORMAL HIGH (ref 3.4–4.5)
SODIUM: 139 mmol/L (ref 135–145)

## 2021-02-04 NOTE — Unmapped (Signed)
REASON FOR VISIT: Hospital Follow-up Medication Management    ASSESSMENT AND PLAN:  #UTI  - abx completed; pencillin mis-dosing discussed w/ Dr. Rose Fillers. Defer a/p to Dr. Rose Fillers    #[name], TTR cardiac Amyloid  - torsemide 40mg  daily,  spironolactone 50mg  daily; agree w/ BMP today  - tafamidis 61mg  daily    #DM  Lab Results   Component Value Date    A1C 7.4 (H) 12/12/2020   - Jardiance stopped during admission due to UTIs  - metformin 500mg  BID  - BG at goal; no change recommended today    #COPD  - Advair 2 puffs BID, albuterol, Spiriva respimat 2 puffs dailymontelukast 10mg  daily  - no signs of exacerbation today    #The 10-year ASCVD risk score Denman George DC Jr., et al., 2013) is: 20.5%  - rosuvastatin 5mg  every other day    #afib w/ RVR  - Eliquis 5mg  BID; continue to monitor renal function. She may need a dose reduction when she turns 80 if SCr >=1.5 (SCr 1.37 today)  - amiodarone 200mg  daily. Monitoring:  Lab Results   Component Value Date    TSH 1.572 01/06/2021     Lab Results   Component Value Date    ALKPHOS 85 01/07/2021    BILITOT 0.6 01/07/2021    BILIDIR 0.60 (H) 10/11/2020    PROT 7.7 01/07/2021    ALBUMIN 3.2 (L) 01/07/2021    ALT <7 (L) 01/07/2021    AST 20 01/07/2021     Date of last PFTs 08/2019  Last ophthalmology visit unknown; may need referral at next visit    #peripheral neuropathy  - pain managed by outside pain clinic  - gabapentin 800mg  TID  - oxycodone, naloxone     #Medication Management  - Medications prescribed or ordered upon discharge were reviewed today and reconciled with the most recent outpatient medication list.  Medication reconciliation was conducted by a prescribing practitioner, or clinical pharmacist.   - Reviewed the indication, dose, and frequency of each medication with patient    Recommendations and medication-related problems were discussed directly with Dr. Rose Fillers who will see Barbara Huber immediately following this visit. Updated medication list and medication changes will be provided to the patient as a printed after visit summary.    HISTORY OF PRESENT ILLNESS:   Barbara Huber is a 80 y.o. ye3ar old female with TTR cardiac amyloid, COPD on 3L at baseline, OSA, pHTN, T2DM, [name]3 who was recently hospitalized for complicated UTI.    UTI treated with 4 days of ertapenem, and was subsequently transitioned to amoxicillin 1000 mg twice daily for 5 days. This would complete course for E. coli, Enterococcus as well as suspected colonization of Enterococcus.  After discussion with her primary cardiologist, London Pepper was discontinued due to second episodes of UTI since starting this.    Today:  She reports feeling much better. Took amoxicillin 500mg  PO BID for several days b/c the instructions were confusing. Then took 1000mg  BID to complete the course. Last dose 2/5.    Breathing has been good    Pain is managed by Dr. Tressie Ellis, [facility]     FBG 107-111    Home BP 120s/60s    Barbara Huber did not bring medication bottles, denies missed doses of medications and does not need refills today.    Patient manages medications at home.     Patient does not have to do without their medications because of cost.     DISCREPANCIES NOTED  DURING MED REVIEW:  1.   Amoxicillin as noted above    MEDICATIONS AND ALLERGIES:   Reviewed and updated in EPIC      ___________________________________________________   Jeanella Flattery, PharmD, CPP

## 2021-02-04 NOTE — Unmapped (Signed)
[facility] Checklist       Type of visit:  face to face    Reason for visit: HOSPITAL F/U    Questions / Concerns that need to be addressed: ON 3 1/2 LITERS OF OXYGEN/ BS HAVE CONSITENTLY BEEN BELOW 100    General Consent to Treat (GCT) for non-epic video visits only:       Diabetes:  ??? Regularly checking blood sugars?: yes  o If yes, when? Complete log for past 7 days  Date Before Breakfast After Breakfast Before Lunch After Lunch Before Dinner After Dinner Before Bed                                                                                                                                     Hypertension:  ??? Have blood pressure cuff at home?: yes- arm cuff  ??? Regularly checking blood pressure?: yes  If yes, complete log for past 7 days  Date Time BP Pulse                                                                             Screening BP- 126/63,P-86        [facility] BPs (complete if screening BP has a systolic  > 139 or diastolic > 89)  BP#1    BP#2   BP#3     Average BP   (please note this as a comment in vitals)         Allergies reviewed: Yes    Medication reviewed: No  Pended refills? No        HCDM reviewed and updated in Epic:    We are working to make sure all of our patients??? wishes are updated in Epic and part of that is documenting a Environmental health practitioner for each patient  A Health Care Decision Rodena Piety is someone you choose who can make health care decisions for you if you are not able ??? who would you most want to do this for you????  is already up to date.        BPAs completed:  Falls Risk - adults 65+  Diabetes - Foot Exam      COVID-19 Vaccine Summary  Which COVID-19 Vaccine was administered  Pfizer  Type:  Dates Given:  12/12/2020                   If no: Are you interested in scheduling?     Immunization History   Administered Date(s) Administered   ??? COVID-19 VACC,MRNA,(PFIZER)(PF)(IM) 01/19/2020, 02/09/2020, 12/12/2020   ??? Influenza Vaccine Quad (IIV4 PF) 91mo+  injectable 10/16/2015, 09/08/2019, 08/30/2020   ??? Influenza Virus Vaccine, unspecified formulation 09/27/2014, 09/23/2017, 09/21/2018   ??? Influenza, High Dose (IIV4) 65 yrs & older 09/28/2016   ??? PNEUMOCOCCAL POLYSACCHARIDE 23 09/28/2016   ??? Pneumococcal Conjugate 13-Valent 08/22/2019       __________________________________________________________________________________________    SCREENINGS COMPLETED IN FLOWSHEETS    HARK Screening       AUDIT       PHQ2       PHQ9          P4 Suicidality Screener                GAD7       COPD Assessment       Falls Risk  Falls Risk  Have you fallen in the past year?: No  Do you feel unsteady when standing or walking?:  (sometimes)    .imcres

## 2021-02-04 NOTE — Unmapped (Signed)
HOSPITAL FOLLOW UP PRE-VISIT ASSESSMENT:       ___________________________________________________    Clinical Social Worker (SW) contacted patient via phone to follow up on their recent hospitalization.      I???m calling to ask you some questions and let you know some information related to your recent hospital stay.   Is there someone else in the room? Yes. What is your relationship? Daughter. Do you want this person here for the visit? Yes.    Your appointment will be with a pharmacist and a physician from our clinic who will follow up with your primary care physician.       Confirmed day and time of appointment with patient is:  02/03/21 @ 1:40pm  ?? Do you have difficulty getting to your medical appointments: No. Daughter provides transportation.  ?? Patient was encouraged to bring medications to HFU visit.     ?? How often do you have someone help you when you read instructions, pamphlets or written material from your doctor or pharmacy:  Sometimes    Discharge Home Health [facility]) Care and Durable Medical Equipment (DME) Status and Need:   HH:   ?? does not have home health services.  Denies need for [facility] at this time.  ?? does not have personal care services/personal aide support and denies need for support.    ?? Patient reports she is mostly independent with ADLS. Sometimes receives support from daughter with bathing.     'DME:   ?? Patient reports use of of rollator w/ seat attachment, nebulizer, and oxygen. Patient reports bathroom is handicap accessible; already has shower bench built in and grab bars.  ??  Denies need for repair on existing equipment    ?? Reports need for another walker. States current walker is too small. Received in Nov 2021, covered by insurance. Did not get from [facility]. [facility] helped coordinate the order. Patient was encouraged to check for DME supply company sticker on Rollator that typically provides contact number. Encouraged to contact dme supplier to ask to exchange walker for larger size. Will contact PCP if any orders are needed.      Housing and Aftercare Support:    ?? Patient lives with their daughter and son in law in a House in [place].   ?? She denies issues with home safety.    ?? The patient receives support from her daughter. Daughter assists with IADLS - grocery shopping, picking up medications, transportation to appointments.Pt prepares meals for self often and daughter prepares meals at otther times depending on how patient feels. Daughter works for [employer] and employer is flexible about when dtr needs to take off to bring patient to appointments.  ??  Mental Health:   PHQ-2 Score:  PHQ-2 Total Score : 1     Do you have counseling support? No.   Would you like counseling resource information?  No.     Substance Use:   ?? Alcohol   ? How much alcohol do you typically drink in a week? 0=Never  ? How many drinks do you have on a typical day when drinking alcohol? 0=1-2  ? How often do you have 5 of more drinks on one occasion? 0=Never    ?? Other Substances   ? In the past year how often have you taken non-prescribed prescription medications? Never  ? In the past year how often have you used substances such as marijuana, cocaine, heroin, PCP, ecstacy, etc) Never    ?? Tobacco   ?  When was the last time you used a tobacco product such as cigarettes, cigars, cigarillos, smokeless tobacco, vape or e-cigarettes? a few years ago  ? Tobacco Product: cigarettes  ? Reports no cravings for cigarettes, no need for supports.       ------------------------------------------------------------------------------------------------------------------------------------------   Note routed to Forde Radon, PharmD, CPP and Clyda Hurdle, MD, PhD who will follow up w/patient during their HFU visit.      Family, social and cultural characteristics were assessed during the visit and plan.     Patient was informed they can call the clinic at [phone] with any additional questions or concerns.

## 2021-02-05 DIAGNOSIS — E1122 Type 2 diabetes mellitus with diabetic chronic kidney disease: Principal | ICD-10-CM

## 2021-02-05 DIAGNOSIS — Z87891 Personal history of nicotine dependence: Principal | ICD-10-CM

## 2021-02-05 DIAGNOSIS — Z79891 Long term (current) use of opiate analgesic: Principal | ICD-10-CM

## 2021-02-05 DIAGNOSIS — I43 Cardiomyopathy in diseases classified elsewhere: Principal | ICD-10-CM

## 2021-02-05 DIAGNOSIS — Z7984 Long term (current) use of oral hypoglycemic drugs: Principal | ICD-10-CM

## 2021-02-05 DIAGNOSIS — E854 Organ-limited amyloidosis: Principal | ICD-10-CM

## 2021-02-05 DIAGNOSIS — Z888 Allergy status to other drugs, medicaments and biological substances status: Principal | ICD-10-CM

## 2021-02-05 DIAGNOSIS — Z7951 Long term (current) use of inhaled steroids: Principal | ICD-10-CM

## 2021-02-05 DIAGNOSIS — N3001 Acute cystitis with hematuria: Principal | ICD-10-CM

## 2021-02-05 DIAGNOSIS — R350 Frequency of micturition: Principal | ICD-10-CM

## 2021-02-05 DIAGNOSIS — N183 Chronic kidney disease, stage 3 unspecified: Principal | ICD-10-CM

## 2021-02-05 DIAGNOSIS — E1140 Type 2 diabetes mellitus with diabetic neuropathy, unspecified: Principal | ICD-10-CM

## 2021-02-05 DIAGNOSIS — J449 Chronic obstructive pulmonary disease, unspecified: Principal | ICD-10-CM

## 2021-02-05 DIAGNOSIS — Z7901 Long term (current) use of anticoagulants: Principal | ICD-10-CM

## 2021-02-05 DIAGNOSIS — I509 Heart failure, unspecified: Principal | ICD-10-CM

## 2021-02-05 DIAGNOSIS — Z79899 Other long term (current) drug therapy: Principal | ICD-10-CM

## 2021-02-05 DIAGNOSIS — I130 Hypertensive heart and chronic kidney disease with heart failure and stage 1 through stage 4 chronic kidney disease, or unspecified chronic kidney disease: Principal | ICD-10-CM

## 2021-02-05 LAB — URINALYSIS WITH CULTURE REFLEX
BILIRUBIN UA: NEGATIVE
GLUCOSE UA: NEGATIVE
KETONES UA: NEGATIVE
NITRITE UA: NEGATIVE
PH UA: 8.5 (ref 5.0–9.0)
PROTEIN UA: >300 — AB
RBC UA: >100 /HPF — ABNORMAL HIGH (ref <4)
SPECIFIC GRAVITY UA: 1.02 (ref 1.005–1.040)
SQUAMOUS EPITHELIAL: <1 /HPF (ref 0–5)
UROBILINOGEN UA: 0.2
WBC UA: 15 /HPF — ABNORMAL HIGH (ref 0–5)

## 2021-02-06 ENCOUNTER — Ambulatory Visit
Admit: 2021-02-06 | Discharge: 2021-02-06 | Disposition: A | Discharge status: Home / Self Care | Payer: MEDICARE | Attending: Emergency Medicine

## 2021-02-06 MED ORDER — CEFDINIR 300 MG CAPSULE
ORAL_CAPSULE | Freq: Every day | ORAL | 0 refills | 10 days | Status: CP
Start: 2021-02-06 — End: 2021-02-16

## 2021-02-06 MED ADMIN — cefdinir (OMNICEF) capsule 300 mg: 300 mg | ORAL | @ 06:00:00 | Stop: 2021-02-06

## 2021-02-06 NOTE — Unmapped (Signed)
I called and talked to both patient and her daughter, Barbara Huber. I explained the OO results were back and likely along with her Trilogy at nighttime but [employer] stated that they would not be able to provide more O2 unless she showed need on a test OR do a formal sleep study. Daughter thought the sleep study would be a great idea since it has been so long and she has had many health changes. Barbara Huber talked a bit about decreasing her O2 at night and said her sats on 3.5 LP was 91-92 and that having sats around 96-97 was too much for her. It was unclear if she was testing while awake with those sats or while asleep.Daughter stated she will wait to hear back from [place] with next steps.

## 2021-02-06 NOTE — Unmapped (Signed)
[facility] Emergency Department Provider Note    Patient Identification  Barbara Huber  Patient information was obtained from patient.  History/Exam limitations: none.    ED Clinical Impression     Final diagnoses:   Acute cystitis with hematuria (Primary)     Initial Impression, ED Course, Assessment and Plan     Impression: 80 y.o. 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     On initial evaluation, the patient is generally well appearing, nontoxic, in no acute distress. VS unremarkable, no tachycardia, hypoxia, or tachypnea and patient is afebrile. On exam, patient is on home 3L O2, exam otherwise unremarkable.     Labs previous to my evaluation notable for UA with moderate leukocyte esterase, 15 WBC, and moderate bacteria, consistent with infection. Based on previous cultures, will discharge patient home with [facility] for antibiotic coverage. I discussed with patient anticipatory guidance, recommended follow up, and return precautions. Patient was provided information for follow up. The patient expresses understanding and is comfortable with the discharge plan.    Disposition: Discharge    Additional Medical Decision Making     I reviewed the patient's prior medical records.     Any labs and radiology results that were available during my care of the patient were independently reviewed by me and considered in my medical decision making.    Portions of this record have been created using Scientist, clinical (histocompatibility and immunogenetics). Dictation errors have been sought, but may not have been identified and corrected.  ____________________________________________    I have reviewed the triage vital signs and the nursing notes.      Patient History     Chief Complaint  Genitourinary Problem      HPI   Barbara Huber is a 80 y.o. 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     Per review, the patient had recent admission at [facility] on 1/28 for complicated UTI. She was treated with 4 days of ertapenem and was subsequently transitioned to amoxicillin 1000 mg twice daily for 5 days.??This would complete course for E. coli, Enterococcus as well as suspected colonization of Enterococcus which she was compliant with.     She completed the course 5 days ago with improvement of her symptoms. However, at ~4:00 PM this evening she developed urinary urgency and dysuria. She has been eating and drinking normally. No known antibiotic allergies. No lower back pain, hematuria, fevers, vomiting, diarrhea, congestion, cough, chest pain, or shortness of breath.     Past Medical History:   Diagnosis Date   ??? Acute on chronic diastolic (congestive) heart failure (CMS-HCC) 08/23/2020   ??? Arthritis    ??? Calculus of kidney    ??? Calculus of ureter    ??? CHF (congestive heart failure) (CMS-HCC)    ??? COPD (chronic obstructive pulmonary disease) (CMS-HCC)    ??? Diabetes (CMS-HCC)    ??? Gangrenous cholecystitis 10/11/2020   ??? GERD (gastroesophageal reflux disease)    ??? Hydronephrosis    ??? Hypertension    ??? Intermediate coronary syndrome (CMS-HCC) 03/13/2014   ??? Lumbar  stenosis    ??? Microscopic hematuria    ??? Nausea alone    ??? Neuropathy    ??? Nocturia    ??? Other chronic cystitis    ??? Pulmonary hypertension (CMS-HCC)    ??? Renal colic    ??? Sleep apnea    ??? Unstable angina pectoris (CMS-HCC) 03/13/2014       Patient Active Problem List   Diagnosis   ??? Spinal stenosis of lumbar region   ??? Thoracic or lumbosacral neuritis or radiculitis   ??? Lumbosacral spondylosis   ??? COPD (chronic obstructive pulmonary disease) (CMS-HCC)   ??? Sleep apnea in adult   ??? Essential hypertension   ??? Type 2 diabetes mellitus with stage 3b chronic kidney disease, without long-term current use of insulin (CMS-HCC)   ??? Nephrolithiasis   ??? Chronic cystitis   ??? Incomplete bladder emptying   ??? Nocturia   ??? Chronic atrial fibrillation (CMS-HCC)   ??? Peripheral neuropathy   ??? Chronic respiratory failure with hypoxia (CMS-HCC)   ??? Anemia   ??? Persistent atrial fibrillation (CMS-HCC)   ??? Anemia in chronic kidney disease   ??? Chronic kidney disease, stage III (moderate) (CMS-HCC)   ??? Enrolled in chronic care management   ??? Pulmonary hypertension (CMS-HCC)   ??? Shortness of breath   ??? Chronic prescription opiate use   ??? Dyspnea on exertion   ??? Lower extremity edema   ??? Hyponatremia   ??? Acute kidney injury superimposed on chronic kidney disease (CMS-HCC)   ??? 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 Cardiac amyloidosis (CMS-HCC)   ??? Complicated UTI (urinary tract infection)       Past Surgical History:   Procedure Laterality Date   ??? BACK SURGERY  1995   ??? CARPAL TUNNEL RELEASE Left 2014   ??? HYSTERECTOMY  1971   ??? IR INSERT CHOLECYSTOSMY TUBE PERCUTANEOUS  10/02/2020    IR INSERT CHOLECYSTOSMY TUBE PERCUTANEOUS 10/02/2020 Braulio Conte, MD IMG VIR H&V [facility]   ??? LUMBAR DISC SURGERY     ??? PR REMOVAL GALLBLADDER N/A 10/06/2020    Procedure: CHOLECYSTECTOMY;  Surgeon: Katherina Mires, MD;  Location: MAIN OR [facility];  Service: Trauma   ??? PR RIGHT HEART CATH O2 SATURATION & CARDIAC OUTPUT N/A 09/30/2020    Procedure: Right Heart Catheterization;  Surgeon: Neal Dy, MD;  Location: [facility] CATH;  Service: Cardiology   ??? PR RIGHT HEART CATH O2 SATURATION & CARDIAC OUTPUT N/A 01/09/2021    Procedure: Right Heart Catheterization;  Surgeon: Lesle Reek, MD;  Location: [facility] CATH;  Service: Cardiology       No current facility-administered medications for this encounter.    Current Outpatient Medications:   ???  ACCU-CHEK AVIVA PLUS TEST STRP Strp, USE TO CHECK BLOOD SUGAR 3 TIMES A DAY BEFORE MEALS, Disp: , Rfl:   ???  acetaminophen (TYLENOL) 500 MG tablet, Take 1,000 mg by mouth daily as needed for pain. , Disp: , Rfl:   ???  albuterol HFA 90 mcg/actuation inhaler, Inhale 2 puffs every eight (8) hours as needed for wheezing., Disp: , Rfl:   ???  amiodarone (PACERONE) 200 MG tablet, Take 1 tablet (200 mg total) by mouth daily., Disp: 30 tablet, Rfl: 11  ???  apixaban (ELIQUIS) 5 mg Tab, Take 1 tablet (5 mg total) by mouth Two (2) times a day., Disp: 60 tablet, Rfl: 11  ???  cefdinir (OMNICEF) 300 MG capsule, Take 2 capsules (600 mg total)  by mouth daily for 10 days., Disp: 20 capsule, Rfl: 0  ???  fluticasone propion-salmeteroL (ADVAIR HFA) 115-21 mcg/actuation inhaler, Inhale 2 puffs Two (2) times a day., Disp: 12 g, Rfl: 11  ???  gabapentin (NEURONTIN) 800 MG tablet, Take 1 tablet (800 mg total) by mouth Three (3) times a day., Disp: 270 tablet, Rfl: 3  ???  lancets Misc, 1 each by Miscellaneous route daily. Accu Check, Disp: 100 each, Rfl: 6  ???  metFORMIN (GLUCOPHAGE) 500 MG tablet, Take 1 tablet (500 mg total) by mouth 2 (two) times a day with meals., Disp: 180 tablet, Rfl: 3  ???  montelukast (SINGULAIR) 10 mg tablet, Take 1 tablet (10 mg total) by mouth daily., Disp: 90 tablet, Rfl: 3  ???  NARCAN 4 mg/actuation nasal spray, 1 spray into alternating nostrils once as needed (opioid overdose).  (Patient not taking: Reported on 02/04/2021), Disp: , Rfl:   ???  oxyCODONE (OXYCONTIN) 10 mg TR12 12 hr crush resistant ER/CR tablet, Take 10 mg by mouth every twelve (12) hours., Disp: , Rfl:   ???  OXYGEN-AIR DELIVERY SYSTEMS MISC, 5 L by Miscellaneous route., Disp: , Rfl:   ???  rosuvastatin (CRESTOR) 5 MG tablet, Take 1 tablet (5 mg total) by mouth every other day., Disp: 3015 tablet, Rfl: 11  ???  spironolactone (ALDACTONE) 50 MG tablet, Take 1 tablet (50 mg total) by mouth daily., Disp: 90 tablet, Rfl: 3  ???  tafamidis 61 mg cap, Take 1 capsule (61 mg) by mouth daily., Disp: 30 capsule, Rfl: 11  ???  tiotropium bromide (SPIRIVA RESPIMAT) 2.5 mcg/actuation inhalation mist, Inhale 2 puffs daily., Disp: 4 g, Rfl: 11  ???  torsemide (DEMADEX) 20 MG tablet, Take 2 tablets (40 mg total) by mouth daily., Disp: 60 tablet, Rfl: 0    Allergies  Nitrofurantoin and Lipitor [atorvastatin]    Family History   Problem Relation Age of Onset   ??? Hypertension Mother    ??? Cancer Father         COLON CANCER   ??? Anesthesia problems Neg Hx    ??? Broken bones Neg Hx    ??? Clotting disorder Neg Hx    ??? Collagen disease Neg Hx    ??? Diabetes Neg Hx    ??? Dislocations Neg Hx    ??? Fibromyalgia Neg Hx    ??? Gout Neg Hx    ??? Hemophilia Neg Hx    ??? Osteoporosis Neg Hx    ??? Rheumatologic disease Neg Hx    ??? Scoliosis Neg Hx    ??? Severe sprains Neg Hx    ??? Sickle cell anemia Neg Hx    ??? Spinal Compression Fracture Neg Hx    ??? GU problems Neg Hx    ??? Kidney cancer Neg Hx    ??? Prostate cancer Neg Hx        Social History  Social History     Tobacco Use   ??? Smoking status: Former Smoker     Packs/day: 0.33     Types: Cigarettes   ??? Smokeless tobacco: Never Used   ??? Tobacco comment: Quit a few years ago   Substance Use Topics   ??? Alcohol use: No   ??? Drug use: No       Review of Systems  General: no fevers, chills  Cardiac: no CP, SOB, palpitations  Pulmonary: no cough, sputum production, hemoptysis  A 10 point review of systems was negative except for those previously mentioned  in the HPI and above.    Physical Exam     ED Triage Vitals [02/05/21 2252]   Enc Vitals Group      BP 127/56      Heart Rate 87      SpO2 Pulse 87      Resp 18      Temp 36.8 ??C (98.2 ??F)      Temp Source Oral      SpO2 96 %      Weight 95.3 kg (210 lb)     Constitutional: alert and cooperative in NAD  ENT:       Head: Butts/AT       Eyes: PERRL, conjunctiva clear, sclera anicteric       Nose: no congestion       Mouth/Throat: MM moist       Neck: supple, midline trachea, no tenderness or cervical adenopathy  Cardiac: RRR, normal S1, S2, no appreciable murmurs or rubs. Distal pulses present and symmetrical B/L  Respiratory: On home 3L O2, CTA bilaterally, non-labored breathing, no stridor, wheezing or crackles  Abdomen: soft, NT, normal BS. No masses, rebound or guarding  Extremities: LE normal with no CCE, symmetric in size and NTTP  Skin: warm and dry, no rashes or lesions  Neurologic: no gross focal neurologic deficits appreciated  Psychiatric: normal mood, affect, speech and behavior    EKG     None    Radiology     None    Documentation assistance was provided by Adam Phenix, Scribe on February 06, 2021 at 12:46 AM for Irven Baltimore, MD.       Documentation assistance was provided by the scribe in my presence.  The documentation recorded by the scribe has been reviewed by me and accurately reflects the services I personally performed.           Harriet Pho, MD  02/08/21 [id number]

## 2021-02-06 NOTE — Unmapped (Signed)
Patient admitted for UTI and spent 4 days in hospital. Sent home with amoxicillin. Reports symptoms had resolved but is now having burning with urination and frequent urination.

## 2021-02-07 ENCOUNTER — Ambulatory Visit: Admit: 2021-02-07 | Discharge: 2021-02-08 | Payer: MEDICARE | Attending: Family | Primary: Family

## 2021-02-07 DIAGNOSIS — M65839 Other synovitis and tenosynovitis, unspecified forearm: Principal | ICD-10-CM

## 2021-02-07 MED ADMIN — triamcinolone acetonide (KENALOG) injection 10 mg: 10 mg | @ 22:00:00 | Stop: 2021-02-07

## 2021-02-07 NOTE — Unmapped (Signed)
Urine Culture  Order: [id number] - Reflex for Order [id number]  Status: Preliminary result ??    50,000 to 100,000 CFU/mL Escherichia coli??Abnormal??   Presumptive Identification    Specimen Source: Clean Catch

## 2021-02-10 DIAGNOSIS — J302 Other seasonal allergic rhinitis: Principal | ICD-10-CM

## 2021-02-10 MED ORDER — MONTELUKAST 10 MG TABLET
ORAL_TABLET | Freq: Every day | ORAL | 3 refills | 90 days | Status: CP
Start: 2021-02-10 — End: ?

## 2021-02-10 NOTE — Unmapped (Signed)
Pharmacy requesting refill.

## 2021-02-10 NOTE — Unmapped (Signed)
Urine Culture  Order: [id number] - Reflex for Order [id number]  Status: Final result ??    50,000 to 100,000 CFU/mL Escherichia coli??Abnormal??   Presumptive Identification    Specimen Source: Clean Catch    cefdinir (OMNICEF) 300 MG capsule [[id number]]

## 2021-02-14 NOTE — Unmapped (Signed)
[facility]  Date: 02/07/2021     Primary Care Physician: Jacquiline Doe, MD          ASSESSMENT:    ICD-10-CM   1. 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  We will proceed with the following treatment plan:  1. Medications: No new medication today.  2. DME/Cast: Wrist thumb spica brace for the next few days to 2 weeks.  He may discontinue if it seems to irritate her or if pain has completely resolved after injection     The encounter diagnosis was Intersection syndrome of wrist..  The patient was prescribed a wrist thumb spica to be used on their left upper extremity for the purpose of immobilization/support.    3. Injections:   After reviewing risk of injection, she elected proceed with injection today.  A timeout was performed per protocol.  Her skin was cleansed with chlorhexidine using aseptic technique.  First dorsal compartment was injected with Kenalog 10 mg and 1 mL of 1% lidocaine without epinephrine.  The procedure was well-tolerated.  She noted prompt symptomatic improvement    Scheduling Notes:  No follow-ups on file.    - X-rays to be ordered at next visit: None     Requested Prescriptions      No prescriptions requested or ordered in this encounter        No orders of the defined types were placed in this encounter.      SUBJECTIVE:  Chief Complaint: Left wrist pain  History of Present Illness:   Barbara Huber is a 80 y.o. female who presents for left wrist pain localized to the radial styloid.  She has had pain for quite some time but pain has worsened over the past few weeks.  She has had multiple difficult medical problems and she would like some relief of her wrist pain.  She initially had all pain at the radial styloid and had an injection for de Quervain's tenosynovitis Vitas last week.  She states that that pain has improved but she has very severe pain in in the adjacent aspect of the dorsal wrist.  She has been wearing wrist thumb spica brace during the day.  It is more painful at night.  Pain Score:   (as reported on intake)        ROS:    .       Pertinent positives and negatives are documented in the HPI. All other systems reviewed are negative. Patient was instructed to follow-up with the appropriate provider as necessary for all pertinent positives not related to today's encounter.    Medical History:  Past Medical History:   Diagnosis Date   ??? Acute on chronic diastolic (congestive) heart failure (CMS-HCC) 08/23/2020   ??? Arthritis    ??? Calculus of kidney    ??? Calculus of ureter    ??? CHF (congestive heart failure) (CMS-HCC)    ??? COPD (chronic obstructive pulmonary disease) (CMS-HCC)    ??? Diabetes (CMS-HCC)    ??? Gangrenous cholecystitis 10/11/2020   ??? GERD (gastroesophageal reflux disease)    ??? Hydronephrosis    ??? Hypertension    ??? Intermediate coronary syndrome (CMS-HCC) 03/13/2014   ??? Lumbar stenosis    ??? Microscopic hematuria    ??? Nausea alone    ??? Neuropathy    ??? Nocturia    ??? Other chronic cystitis    ???  Pulmonary hypertension (CMS-HCC)    ??? Renal colic    ??? Sleep apnea    ??? Unstable angina pectoris (CMS-HCC) 03/13/2014       Surgical History:  Past Surgical History:   Procedure Laterality Date   ??? BACK SURGERY  1995   ??? CARPAL TUNNEL RELEASE Left 2014   ??? HYSTERECTOMY  1971   ??? IR INSERT CHOLECYSTOSMY TUBE PERCUTANEOUS  10/02/2020    IR INSERT CHOLECYSTOSMY TUBE PERCUTANEOUS 10/02/2020 Braulio Conte, MD IMG VIR H&V [facility]   ??? LUMBAR DISC SURGERY     ??? PR REMOVAL GALLBLADDER N/A 10/06/2020    Procedure: CHOLECYSTECTOMY;  Surgeon: Katherina Mires, MD;  Location: MAIN OR [facility];  Service: Trauma   ??? PR RIGHT HEART CATH O2 SATURATION & CARDIAC OUTPUT N/A 09/30/2020    Procedure: Right Heart Catheterization;  Surgeon: Neal Dy, MD;  Location: [facility] CATH;  Service: Cardiology   ??? PR RIGHT HEART CATH O2 SATURATION & CARDIAC OUTPUT N/A 01/09/2021    Procedure: Right Heart Catheterization;  Surgeon: Lesle Reek, MD;  Location: [facility] CATH;  Service: Cardiology       Medications:  ??? ACCU-CHEK AVIVA PLUS TEST STRP Strp USE TO CHECK BLOOD SUGAR 3 TIMES A DAY BEFORE MEALS   ??? acetaminophen (TYLENOL) 500 MG tablet Take 1,000 mg by mouth daily as needed for pain.    ??? albuterol HFA 90 mcg/actuation inhaler Inhale 2 puffs every eight (8) hours as needed for wheezing.   ??? amiodarone (PACERONE) 200 MG tablet Take 1 tablet (200 mg total) by mouth daily.   ??? apixaban (ELIQUIS) 5 mg Tab Take 1 tablet (5 mg total) by mouth Two (2) times a day.   ??? cefdinir (OMNICEF) 300 MG capsule Take 2 capsules (600 mg total) by mouth daily for 10 days.   ??? fluticasone propion-salmeteroL (ADVAIR HFA) 115-21 mcg/actuation inhaler Inhale 2 puffs Two (2) times a day.   ??? gabapentin (NEURONTIN) 800 MG tablet Take 1 tablet (800 mg total) by mouth Three (3) times a day.   ??? lancets Misc 1 each by Miscellaneous route daily. Accu Check   ??? metFORMIN (GLUCOPHAGE) 500 MG tablet Take 1 tablet (500 mg total) by mouth 2 (two) times a day with meals.   ??? montelukast (SINGULAIR) 10 mg tablet Take 1 tablet (10 mg total) by mouth daily.   ??? NARCAN 4 mg/actuation nasal spray 1 spray into alternating nostrils once as needed (opioid overdose).  (Patient not taking: Reported on 02/04/2021)   ??? oxyCODONE (OXYCONTIN) 10 mg TR12 12 hr crush resistant ER/CR tablet Take 10 mg by mouth every twelve (12) hours.   ??? OXYGEN-AIR DELIVERY SYSTEMS MISC 5 L by Miscellaneous route.   ??? rosuvastatin (CRESTOR) 5 MG tablet Take 1 tablet (5 mg total) by mouth every other day.   ??? spironolactone (ALDACTONE) 50 MG tablet Take 1 tablet (50 mg total) by mouth daily.   ??? tafamidis 61 mg cap Take 1 capsule (61 mg) by mouth daily.   ??? tiotropium bromide (SPIRIVA RESPIMAT) 2.5 mcg/actuation inhalation mist Inhale 2 puffs daily.   ??? torsemide (DEMADEX) 20 MG tablet Take 2 tablets (40 mg total) by mouth daily.       Allergies:  Nitrofurantoin and Lipitor [atorvastatin]    Social History:  Social History     Socioeconomic History   ??? Marital status: Widowed     Spouse name: Not on file   ??? Number of children: Not on file   ???  Years of education: Not on file   ??? Highest education level: Not on file   Occupational History   ??? Occupation: retired   Tobacco Use   ??? Smoking status: Former Smoker     Packs/day: 0.33     Types: Cigarettes   ??? Smokeless tobacco: Never Used   ??? Tobacco comment: Quit a few years ago   Substance and Sexual Activity   ??? Alcohol use: No   ??? Drug use: No   ??? Sexual activity: Not on file   Other Topics Concern   ??? Exercise No   ??? Living Situation Not Asked   Social History Narrative    Previously worked in Designer, fashion/clothing, retired.  Daughter is Economist who works at [employer].  Her niece is Selena Batten, [employer] Anesthesia Tech.  Lives in [place] with daughter and son-in-law.     Social Determinants of Health     Financial Resource Strain: Low Risk    ??? Difficulty of Paying Living Expenses: Not very hard   Food Insecurity: No Food Insecurity   ??? Worried About Running Out of Food in the Last Year: Never true   ??? Ran Out of Food in the Last Year: Never true   Transportation Needs: No Transportation Needs   ??? Lack of Transportation (Medical): No   ??? Lack of Transportation (Non-Medical): No   Physical Activity: Not on file   Stress: Not on file   Social Connections: Not on file       Family History:  Family History   Problem Relation Age of Onset   ??? Hypertension Mother    ??? Cancer Father         COLON CANCER   ??? Anesthesia problems Neg Hx    ??? Broken bones Neg Hx    ??? Clotting disorder Neg Hx    ??? Collagen disease Neg Hx    ??? Diabetes Neg Hx    ??? Dislocations Neg Hx    ??? Fibromyalgia Neg Hx    ??? Gout Neg Hx    ??? Hemophilia Neg Hx    ??? Osteoporosis Neg Hx    ??? Rheumatologic disease Neg Hx    ??? Scoliosis Neg Hx    ??? Severe sprains Neg Hx    ??? Sickle cell anemia Neg Hx    ??? Spinal Compression Fracture Neg Hx    ??? GU problems Neg Hx    ??? Kidney cancer Neg Hx    ??? Prostate cancer Neg Hx          OBJECTIVE:  DETAILED PHYSICAL EXAM   General Appearance ?? well-nourished, in no acute distress.  Estimated body mass index is 34.95 kg/m?? as calculated from the following:    Height as of 02/04/21: 165.1 cm (5' 5).    Weight as of 02/05/21: 95.3 kg (210 lb).   Mood and Affect ?? alert, cooperative and pleasant.   Gait  ??  presents in wheelchair   Cardiovascular ?? well-perfused distally and no swelling.   Sensation ?? sensation to light touch distally normal      Left thumb CMC enlargement.  No tenderness palpation at the radial styloid. Swelling and TTP second dorsal compartment. Negative Finkelstein's.  Balance of the wrist is nontender.  No pain with CMC grind.  Capillary fill is brisk.  Skin is warm, dry and intact.    Test Results  Imaging:  No new x-rays taken today.       *This note was created  using Scientist, clinical (histocompatibility and immunogenetics). Errors may persist despite proofreading.       Ivor Reining, FNP

## 2021-02-19 NOTE — Unmapped (Unsigned)
[facility] Clinical Assessment & Refill Coordination Note    Barbara Huber, DOB: [birth year]  Phone: [phone] (home)     All above HIPAA information was verified with patient.     Was a Nurse, learning disability used for this call? No    Specialty Medication(s):   General Specialty: Vyndamax     Current Outpatient Medications   Medication Sig Dispense Refill   ??? ACCU-CHEK AVIVA PLUS TEST STRP Strp USE TO CHECK BLOOD SUGAR 3 TIMES A DAY BEFORE MEALS     ??? acetaminophen (TYLENOL) 500 MG tablet Take 1,000 mg by mouth daily as needed for pain.      ??? albuterol HFA 90 mcg/actuation inhaler Inhale 2 puffs every eight (8) hours as needed for wheezing.     ??? amiodarone (PACERONE) 200 MG tablet Take 1 tablet (200 mg total) by mouth daily. 30 tablet 11   ??? apixaban (ELIQUIS) 5 mg Tab Take 1 tablet (5 mg total) by mouth Two (2) times a day. 60 tablet 11   ??? fluticasone propion-salmeteroL (ADVAIR HFA) 115-21 mcg/actuation inhaler Inhale 2 puffs Two (2) times a day. 12 g 11   ??? gabapentin (NEURONTIN) 800 MG tablet Take 1 tablet (800 mg total) by mouth Three (3) times a day. 270 tablet 3   ??? lancets Misc 1 each by Miscellaneous route daily. Accu Check 100 each 6   ??? metFORMIN (GLUCOPHAGE) 500 MG tablet Take 1 tablet (500 mg total) by mouth 2 (two) times a day with meals. 180 tablet 3   ??? montelukast (SINGULAIR) 10 mg tablet Take 1 tablet (10 mg total) by mouth daily. 90 tablet 3   ??? NARCAN 4 mg/actuation nasal spray 1 spray into alternating nostrils once as needed (opioid overdose).  (Patient not taking: Reported on 02/04/2021)     ??? oxyCODONE (OXYCONTIN) 10 mg TR12 12 hr crush resistant ER/CR tablet Take 10 mg by mouth every twelve (12) hours.     ??? OXYGEN-AIR DELIVERY SYSTEMS MISC 5 L by Miscellaneous route.     ??? rosuvastatin (CRESTOR) 5 MG tablet Take 1 tablet (5 mg total) by mouth every other day. 3015 tablet 11   ??? spironolactone (ALDACTONE) 50 MG tablet Take 1 tablet (50 mg total) by mouth daily. 90 tablet 3   ??? tafamidis 61 mg cap Take 1 capsule (61 mg) by mouth daily. 30 capsule 11   ??? tiotropium bromide (SPIRIVA RESPIMAT) 2.5 mcg/actuation inhalation mist Inhale 2 puffs daily. 4 g 11   ??? torsemide (DEMADEX) 20 MG tablet Take 2 tablets (40 mg total) by mouth daily. 60 tablet 0     No current facility-administered medications for this visit.        Changes to medications: Carrissa reports no changes at this time.    Allergies   Allergen Reactions   ??? Nitrofurantoin Anaphylaxis and Other (See Comments)   ??? Lipitor [Atorvastatin] Muscle Pain     SOB, Headache, fatigue, sick on my stomach       Changes to allergies: No    SPECIALTY MEDICATION ADHERENCE     Vyndamax 61 mg: 6-8 days of medicine on hand      Specialty medication(s) dose(s) confirmed: Regimen is correct and unchanged.     Are there any concerns with adherence? No    Adherence counseling provided? Not needed    CLINICAL MANAGEMENT AND INTERVENTION      Clinical Benefit Assessment:    Do you feel the medicine is effective or  helping your condition? Yes    Clinical Benefit counseling provided? Not needed    Adverse Effects Assessment:    Are you experiencing any side effects? No    Are you experiencing difficulty administering your medicine? 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 For example, brushing your teeth or getting up in the morning. Patient declined to answer    Have you discussed this with your provider? Not needed    Therapy Appropriateness:    Is therapy appropriate? Yes, therapy is appropriate and should be continued    DISEASE/MEDICATION-SPECIFIC INFORMATION      N/A    PATIENT SPECIFIC NEEDS     - Does the patient have any physical, cognitive, or cultural barriers? No    - Is the patient high risk? No    - Does the patient require a Care Management Plan? No     - Does the patient require physician intervention or other additional services (i.e. nutrition, smoking cessation, social work)? No      SHIPPING     Specialty Medication(s) to be Shipped:   General Specialty: Vyndamax    Other medication(s) to be shipped: No additional medications requested for fill at this time     Changes to insurance: No    Delivery Scheduled: Yes, Expected medication delivery date: 02/25/21.     Medication will be delivered via Next Day Courier to the confirmed prescription address in [facility].    The patient will receive a drug information handout for each medication shipped and additional FDA Medication Guides as required.  Verified that patient has previously received a Conservation officer, historic buildings.    All of the patient's questions and concerns have been addressed.    Ann Held   [facility] Specialty Pharmacist

## 2021-02-20 NOTE — Unmapped (Signed)
I reviewed this patient case and all documentation provided by the learner and was readily available for consultation during their interaction with the patient.  I agree with the assessment and plan listed below.    Kishawn Pickar    [facility] Specialty Pharmacist

## 2021-02-21 ENCOUNTER — Ambulatory Visit: Admit: 2021-02-21 | Discharge: 2021-02-22 | Payer: MEDICARE

## 2021-02-21 ENCOUNTER — Ambulatory Visit: Admit: 2021-02-21 | Discharge: 2021-02-22 | Payer: MEDICARE | Attending: Adult Health | Primary: Adult Health

## 2021-02-21 DIAGNOSIS — J9611 Chronic respiratory failure with hypoxia: Principal | ICD-10-CM

## 2021-02-21 DIAGNOSIS — N189 Chronic kidney disease, unspecified: Principal | ICD-10-CM

## 2021-02-21 DIAGNOSIS — E854 Organ-limited amyloidosis: Principal | ICD-10-CM

## 2021-02-21 DIAGNOSIS — I5032 Chronic diastolic (congestive) heart failure: Principal | ICD-10-CM

## 2021-02-21 DIAGNOSIS — J449 Chronic obstructive pulmonary disease, unspecified: Principal | ICD-10-CM

## 2021-02-21 DIAGNOSIS — D631 Anemia in chronic kidney disease: Principal | ICD-10-CM

## 2021-02-21 DIAGNOSIS — J410 Simple chronic bronchitis: Principal | ICD-10-CM

## 2021-02-21 DIAGNOSIS — I43 Cardiomyopathy in diseases classified elsewhere: Principal | ICD-10-CM

## 2021-02-21 MED ORDER — TORSEMIDE 20 MG TABLET
ORAL_TABLET | ORAL | 11 refills | 0.00000 days | Status: CP
Start: 2021-02-21 — End: 2022-02-21

## 2021-02-21 NOTE — Unmapped (Signed)
Today,    MEDICATIONS:  NO medication changes today.    Call if you have questions about your medications.    LABS:  We will call you if your labs need attention.    NEXT APPOINTMENT:  Return to clinic in 1 months with Dr Wonda Cheng      In general, to take care of your heart failure:  -Limit your fluid intake to 2 Liters (half-gallon) per day.    -Limit your salt intake to ideally 2-3 grams (2000-3000 mg) per day.  -Weigh yourself daily and record, and bring that weight diary to your next appointment.  (Weight gain of 2-3 pounds in 1 day typically means fluid weight.)    The medications for your heart are to help your heart and help you live longer.    Please contact us before stopping any of your heart medications.    Call the clinic at [phone] with questions.  Our clinic fax number is [phone].  If you need to reschedule future appointments, please call [phone] or [phone]  My office number (c/o De Burrs RN) is [phone] if you need further assistance.  After office hours, if you have urgent questions/problems, contact the on-call cardiologist through the hospital operator: [phone].    To learn more about heart failure, please read [facility]'s Learning to Live with Heart Failure:  [email or web address].    Available online at   [email or web address] - then click the link Learning to Live with Heart Failure patient booklet    OR    [email or web address] - open the window for Medical Management and click the link Living with Heart Failure

## 2021-02-21 NOTE — Unmapped (Signed)
[facility] Note    Referring Provider: Artelia Laroche, MD  [address]  [address]  [place],  [place] [address]   Primary Provider: Jacquiline Doe, MD  [address]  [place] [address]   Other Providers:  Dr Luretha Murphy    Reason for Visit:  Barbara Huber is a 80 y.o. female being seen for hospital follow up.    Assessment & Plan:  1. 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   - Moderately increased wall thickness with low voltage ECG - she's had issues with hypotension on ARB and BB, as well as atrial arrhythmias. SPEP/UPEP/free light chains without concern. 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 She has one daughter who accompanies her and is informed.   - No longer on Jardiance due to complicated UTIs  - Continue torsemide 40 mg daily with extra 40mg  PRN  - Continue Tafamidis 61 mg daily  - Continue spironolactone 50 mg daily    2. Persistent atrial fibrillation  - CHA2DS2-VASc score = 5 (1-gender, 2-Age, 1-DM, 1-HTN)  - Anticoagulated with Eliquis 5mg  BID  - In NSR s/p cardioversion 1/14  - Continue amiodarone 200mg  daily    3. COPD  - home O2 3L [place]   - on Advair and Spiriva  - Following with pulm    Follow-up:  Return in about 1 month (around 03/21/2021) for already scheduled appointment.      History of Present Illness:  Barbara Huber is a 80 y.o. female with PMHx of COPD on 3L home [place], CHF, [name], [name], T2DM??who presents today for hospital follow-up. She presented to [facility]??with Acute on Chronic Decompensated Diastolic CHF in the setting of Septic Shock d/t Gangrenous Cholecystitis now s/p cholecystectomy 10/6. She presented with 1 week of dyspnea on exertion, leg swelling and abdominal distention without chest pain. Her dry weight was 232 pounds,??238 pounds. Upon arrival to [facility], she was in afib with RVR with higher rates and softer pressures requiring NE and addition of vaso to maintain MAP>65. She was given digoxin x 1 for better rate control with mild improvement in rates but was still requiring high doses of NE as well as vaso.??Dobutamine given to augment diuresis via bumex drip and metolazone x1 dose. Etiology of pressor requirement likely due to septic shock. Echo 10/4 showed upper-normal RV size with??reduced systolic function.??RHC to evaluate for RV dysfunction unable to be completed due to arterial placement of micropuncture sheath during procedure. She was weaned off of pressors and continued to improve with diuresis and treatment of underlying infection. At time of discharge, her acute Diastolic HF was controlled and she was restarted on home meds. For her afib, coreg was held during admission but then restarted prior to discharge.     She as seen in hospital follow-up 10/30/20. She was discharged on Lasix 40mg  BID. Her weight was 221lbs on discharge. Lasix was reduced to 20mg  daily by her PCP for low SBPs (although notes say 40mg  daily). Since reducing the Lasix, her weight crept up to 226lbs. Her belly felt  tight and she had LE edema.  At visit with PCP, her Coreg was also changed to metoprolol 25mg  and irbesartan was stopped. BPs were running 110s/70-90s. HR running around 100-110. Her metoprolol was increased to  50mg  for rate control. Lasix was increased to 40mg  daily.    She was seen 11/18 and her weight was been running 224-227lbs. She has low energy and since increasing metoprolol she notes worsening of her symptoms. She feels bloated and has LE edema. She is not back to her usual activities, like fixing her food or cooking. Her BP is running 100-120s/70-80s.     PYP scan showed TTR amyloid. She then saw Dr Luretha Murphy 12/3 and was feeling ok but somewhat volume up.     She unfortunately was hospitalized 1/11-1/15 for 3 days of worsening shortness of breath and fatigue.  She was found to have AKI and oliguria in the setting of increased beta blocker as an outpatient resulting in decreased cardiac filling/output. She was treated with gentle diuresis and removal of beta blockade with subsequent improvement in blood pressure and renal function. She underwent RHC with showed elevated filling pressures, normal CO, and restrictive filling pattern. She had a DCCV 1/14 with resolution of atrial fibrillation. She was continued on amiodarone.       Interval History  Since her last visit, she was again admitted 1/25-1/28 for episode of rigors and fever to 100.7. On arrival to the ED, patient was febrile 38.3, but otherwise HD stable. Labs notable for leukocytosis to 20.2 and U/A ??with + LE and >100 WBCs, urine culture grew >??100K E coli, >100k  Aeroccocus, and >100k enteroccocus faecalis. She was started on ertapenem based on previous sensitivities. CT abdomen pelvis showed asymmetric urothelial thickening with slight increase stranding of the right renal collecting system.  Blood cultures x2 without growth.  She ultimately received 4 days of ertapenem, and was subsequently transitioned to amoxicillin 1000 mg twice daily for 5 days. This would complete course for E. coli, Enterococcus as well as suspected colonization of Enterococcus.  Jardiance was discontinued due to second episodes of UTI since starting this. She went back to the ED 2/10 for urinary frequency and dysuria. She was treated with oral antibiotics.    Since that time, she has felt really well. She has not had recurrence of UTI symptoms. Her energy is increasing. She has been cooking by herself, which she hasn't done in 3 years. The other day, she cleaned her whole kitchen, polished the furniture, and mopped the floors. Her energy continues to increase. She can take her shower alone and fix her hair. Her weight is staying around 210-211lbs on torsemide 40mg  daily. She takes an extra 40mg  once every 1-2 weeks for weight gain. She usually has to come in with a wheelchair, but now is walking. She denies LE edema, shortness of breath, orthopnea/PND, and bloating.     Cardiovascular History & Procedures:    Cath / PCI:  ?? 01/09/21 - RHC  1. Elevated left and right sided filling pressures  2. Prominent Y-descent in right atrium  3. Preserved cardiac output    CV Surgery:  ??  none    EP Procedures and Devices:  ?? 01/10/21 - DCCV    Non-Invasive Evaluation(s):  Echo:  ?? 09/30/20  Summary    1. Limited study to assess systolic function.    2. IVC size and inspiratory change suggest normal right atrial pressure.  (0-5 mmHg).    3. The left ventricle is normal in size with mildly to moderately increased  wall thickness.    4. The left ventricular systolic function is normal with no obvious wall  motion abnormalities, LVEF is visually estimated at > 55%.  5. Mitral annular calcification is present.    6. The left atrium is mildly dilated in size.    7. The right ventricle is upper normal in size, with reduced systolic  Function.    Cardiac CT/MRI/Nuclear Tests:  ??  None    6 Minute Walk:  ?? None    Cardiopulmonary Stress Tests:  ??  None               Other Past Medical History:  See below for the complete EPIC list of past medical and surgical history.      Allergies:  Nitrofurantoin and Lipitor [atorvastatin]    Current Medications:  Current Outpatient Medications   Medication Sig Dispense Refill   ??? amiodarone (PACERONE) 200 MG tablet Take 1 tablet (200 mg total) by mouth daily. 30 tablet 11   ??? apixaban (ELIQUIS) 5 mg Tab Take 1 tablet (5 mg total) by mouth Two (2) times a day. 60 tablet 11   ??? fluticasone propion-salmeteroL (ADVAIR HFA) 115-21 mcg/actuation inhaler Inhale 2 puffs Two (2) times a day. 12 g 11   ??? gabapentin (NEURONTIN) 800 MG tablet Take 1 tablet (800 mg total) by mouth Three (3) times a day. 270 tablet 3   ??? metFORMIN (GLUCOPHAGE) 500 MG tablet Take 1 tablet (500 mg total) by mouth 2 (two) times a day with meals. 180 tablet 3   ??? montelukast (SINGULAIR) 10 mg tablet Take 1 tablet (10 mg total) by mouth daily. 90 tablet 3   ??? oxyCODONE (OXYCONTIN) 10 mg TR12 12 hr crush resistant ER/CR tablet Take 10 mg by mouth every twelve (12) hours.     ??? rosuvastatin (CRESTOR) 5 MG tablet Take 1 tablet (5 mg total) by mouth every other day. 3015 tablet 11   ??? spironolactone (ALDACTONE) 50 MG tablet Take 1 tablet (50 mg total) by mouth daily. 90 tablet 3   ??? tafamidis 61 mg cap Take 1 capsule (61 mg) by mouth daily. 30 capsule 11   ??? tiotropium bromide (SPIRIVA RESPIMAT) 2.5 mcg/actuation inhalation mist Inhale 2 puffs daily. 4 g 11   ??? torsemide (DEMADEX) 20 MG tablet Take 2 tablets (40 mg total) by mouth daily. May also take 2 tablets (40 mg total) daily as needed (weight gain). 60 tablet 11   ??? ACCU-CHEK AVIVA PLUS TEST STRP Strp USE TO CHECK BLOOD SUGAR 3 TIMES A DAY BEFORE MEALS     ??? acetaminophen (TYLENOL) 500 MG tablet Take 1,000 mg by mouth daily as needed for pain.      ??? albuterol HFA 90 mcg/actuation inhaler Inhale 2 puffs every eight (8) hours as needed for wheezing.     ??? lancets Misc 1 each by Miscellaneous route daily. Accu Check 100 each 6   ??? NARCAN 4 mg/actuation nasal spray 1 spray into alternating nostrils once as needed (opioid overdose).  (Patient not taking: Reported on 02/04/2021)     ??? OXYGEN-AIR DELIVERY SYSTEMS MISC 5 L by Miscellaneous route.       No current facility-administered medications for this visit.       Family History:  The patient's family history includes Cancer in her father; Hypertension in her mother.    Social history:  She  reports that she has quit smoking. Her smoking use included cigarettes. She smoked 0.33 packs per day. She has never used smokeless tobacco. She reports that she does not drink alcohol and does not use drugs.    Review of Systems:  As per  HPI.  Rest of the review of ten systems is negative or unremarkable except as stated above.    Physical Exam:  VITAL SIGNS:   Vitals:    02/21/21 0947   BP: 125/66   Pulse: 77   SpO2: 96%      Wt Readings from Last 3 Encounters:   02/21/21 97.9 kg (215 lb 12.8 oz)   02/21/21 97.5 kg (215 lb)   02/05/21 95.3 kg (210 lb)      Today's Body mass index is 35.78 kg/m??.   Height: 165.1 cm (5' 5)  CONSTITUTIONAL: well-appearing in no acute distress  EYES: Conjunctivae and sclerae clear and anicteric.  ENT: Benign.   CARDIOVASCULAR: JVP not seen above the clavicle with HOB at 90 degrees. Rate and rhythm are regular.  There is no lifts or heaves.  Normal S1, S2. There is no murmur, gallops or rubs.  Radial and pedal pulses are 2+, bilaterally.   There is no pedal edema, bilaterally.   RESPIRATORY: Normal respiratory effort. Clear to auscultation bilaterally..  There are no wheezes.  GASTROINTESTINAL: Soft, non-tender, with audible bowel sounds. Abdomen nondistended.  Liver is nonpalpable.  SKIN: No rashes, ecchymosis or petechiae.  Warm, well perfused.   MUSCULOSKELETAL:  no joint swelling   NEURO/PSYCH: Appropriate mood and affect. Alert and oriented to person, place, and time. No gross motor or sensory deficits evident.    Pertinent Laboratory Studies:   Admission on 02/06/2021, Discharged on 02/06/2021   Component Date Value Ref Range Status   ??? Color, UA 02/05/2021 Yellow   Final   ??? Clarity, UA 02/05/2021 Cloudy   Final   ??? Specific Gravity, UA 02/05/2021 1.020  1.005 - 1.040 Final   ??? pH, UA 02/05/2021 8.5  5.0 - 9.0 Final   ??? Leukocyte Esterase, UA 02/05/2021 Moderate* Negative Final   ??? Nitrite, UA 02/05/2021 Negative  Negative Final   ??? Protein, UA 02/05/2021 >300 mg/dl* Negative Final   ??? Glucose, UA 02/05/2021 Negative  Negative Final   ??? Ketones, UA 02/05/2021 Negative  Negative Final   ??? Urobilinogen, UA 02/05/2021 0.2 mg/dL   Final   ??? Bilirubin, UA 02/05/2021 Negative  Negative Final   ??? Blood, UA 02/05/2021 Large* Negative Final   ??? RBC, UA 02/05/2021 >100* <4 /HPF Final   ??? WBC, UA 02/05/2021 15* 0 - 5 /HPF Final   ??? Squam Epithel, UA 02/05/2021 <1  0 - 5 /HPF Final   ??? Bacteria, UA 02/05/2021 Moderate* None Seen /HPF Final   ??? WBC Clumps 02/05/2021 Occasional* None Seen /HPF Final   ??? Urine Culture, Comprehensive 02/05/2021 50,000 to 100,000 CFU/mL Escherichia coli*  Final   Office Visit on 02/04/2021   Component Date Value Ref Range Status   ??? Sodium 02/04/2021 139  135 - 145 mmol/L Final   ??? Potassium 02/04/2021 4.6* 3.4 - 4.5 mmol/L Final   ??? Chloride 02/04/2021 102  98 - 107 mmol/L Final   ??? CO2 02/04/2021 32.0* 20.0 - 31.0 mmol/L Final   ??? Anion Gap 02/04/2021 5  5 - 14 mmol/L Final   ??? BUN 02/04/2021 32* 9 - 23 mg/dL Final   ??? Creatinine 02/04/2021 1.37* 0.60 - 0.80 mg/dL Final   ??? BUN/Creatinine Ratio 02/04/2021 23   Final   ??? EGFR [name]-EPI Non-African American,* 02/04/2021 37* >=60 mL/min/1.49m2 Final   ??? EGFR [name]-EPI African American, Fem* 02/04/2021 42* >=60 mL/min/1.36m2 Final   ??? Glucose 02/04/2021 166  70 - 179 mg/dL Final   ???  Calcium 02/04/2021 10.0  8.7 - 10.4 mg/dL Final   ??? Color, UA 45/40/9811 Yellow   Final   ??? Clarity, UA 02/04/2021 Clear   Final   ??? Specific Gravity, UA 02/04/2021 1.010  1.005 - 1.030 Final   ??? pH, UA 02/04/2021 7.0  5.0 - 9.0 Final   ??? Leukocyte Esterase, UA 02/04/2021 Negative  Negative Final   ??? Nitrite, UA 02/04/2021 Negative  Negative Final   ??? Protein, UA 02/04/2021 Negative  Negative Final   ??? Glucose, UA 02/04/2021 Negative  Negative Final   ??? Ketones, UA 02/04/2021 Negative  Negative Final   ??? Urobilinogen, UA 02/04/2021 0.2 mg/dL  0.2 - 2.0 mg/dL Final   ??? Bilirubin, UA 02/04/2021 Negative  Negative Final   ??? Blood, UA 02/04/2021 Negative  Negative Final   ??? RBC, UA 02/04/2021 <1  0 - 3 /HPF Final   ??? WBC, UA 02/04/2021 <1  0 - 3 /HPF Final   ??? Squam Epithel, UA 02/04/2021 2  0 - 5 /HPF Final   ??? Bacteria, UA 02/04/2021 None Seen  None Seen /HPF Final   ??? Yeast, UA 02/04/2021 Many* None Seen /HPF Final   ??? WBC 02/04/2021 12.5* 3.5 - 10.5 10*9/L Final   ??? RBC 02/04/2021 4.05  3.90 - 5.03 10*12/L Final   ??? HGB 02/04/2021 9.4* 12.0 - 15.5 g/dL Final ??? HCT 91/47/8295 32.1* 35.0 - 44.0 % Final   ??? MCV 02/04/2021 79.4* 82.0 - 98.0 fL Final   ??? MCH 02/04/2021 23.2* 26.0 - 34.0 pg Final   ??? MCHC 02/04/2021 29.2* 30.0 - 36.0 g/dL Final   ??? RDW 62/13/0865 25.9* 12.0 - 15.0 % Final   ??? MPV 02/04/2021 8.9  7.0 - 10.0 fL Final   ??? Platelet 02/04/2021 272  150 - 450 10*9/L Final   Admission on 01/21/2021, Discharged on 01/24/2021   Component Date Value Ref Range Status   ??? Sodium 01/21/2021 136  135 - 145 mmol/L Final   ??? Potassium 01/21/2021 4.2  3.4 - 4.5 mmol/L Final   ??? Chloride 01/21/2021 100  98 - 107 mmol/L Final   ??? CO2 01/21/2021 28.9  20.0 - 31.0 mmol/L Final   ??? Anion Gap 01/21/2021 7  5 - 14 mmol/L Final   ??? BUN 01/21/2021 20  9 - 23 mg/dL Final   ??? Creatinine 01/21/2021 1.27* 0.60 - 0.80 mg/dL Final   ??? BUN/Creatinine Ratio 01/21/2021 16   Final   ??? EGFR [name]-EPI Non-African American,* 01/21/2021 40* >=60 mL/min/1.74m2 Final   ??? EGFR [name]-EPI African American, Fem* 01/21/2021 46* >=60 mL/min/1.68m2 Final   ??? Glucose 01/21/2021 159  70 - 179 mg/dL Final   ??? Calcium 78/46/9629 9.2  8.7 - 10.4 mg/dL Final   ??? Color, UA 52/84/1324 Yellow   Final   ??? Clarity, UA 01/21/2021 Hazy   Final   ??? Specific Gravity, UA 01/21/2021 1.015  1.005 - 1.040 Final   ??? pH, UA 01/21/2021 7.5  5.0 - 9.0 Final   ??? Leukocyte Esterase, UA 01/21/2021 Moderate* Negative Final   ??? Nitrite, UA 01/21/2021 Negative  Negative Final   ??? Protein, UA 01/21/2021 30 mg/dL* Negative Final   ??? Glucose, UA 01/21/2021 >1000 mg/dL* Negative Final   ??? Ketones, UA 01/21/2021 Negative  Negative Final   ??? Urobilinogen, UA 01/21/2021 0.2 mg/dL   Final   ??? Bilirubin, UA 01/21/2021 Negative  Negative Final   ??? Blood, UA 01/21/2021 Trace* Negative Final   ???  RBC, UA 01/21/2021 1  <4 /HPF Final   ??? WBC, UA 01/21/2021 >100* 0 - 5 /HPF Final   ??? Squam Epithel, UA 01/21/2021 8* 0 - 5 /HPF Final   ??? Bacteria, UA 01/21/2021 Occasional* None Seen /HPF Final   ??? WBC Clumps 01/21/2021 Few* None Seen /HPF Final   ??? Amorphous Crystal, UA 01/21/2021 Moderate  /HPF Final   ??? WBC 01/21/2021 20.2* 3.5 - 10.5 10*9/L Final   ??? RBC 01/21/2021 4.46  3.90 - 5.03 10*12/L Final   ??? HGB 01/21/2021 9.6* 12.0 - 15.5 g/dL Final   ??? HCT 16/09/9603 32.2* 35.0 - 44.0 % Final   ??? MCV 01/21/2021 72.2* 82.0 - 98.0 fL Final   ??? MCH 01/21/2021 21.5* 26.0 - 34.0 pg Final   ??? MCHC 01/21/2021 29.8* 30.0 - 36.0 g/dL Final   ??? RDW 54/08/8118 18.7* 12.0 - 15.0 % Final   ??? MPV 01/21/2021 8.1  7.0 - 10.0 fL Final   ??? Platelet 01/21/2021 373  150 - 450 10*9/L Final   ??? nRBC 01/21/2021 0  <=4 /100 WBCs Final   ??? Neutrophils % 01/21/2021 83.3  % Final   ??? Lymphocytes % 01/21/2021 6.0  % Final   ??? Monocytes % 01/21/2021 9.5  % Final   ??? Eosinophils % 01/21/2021 0.6  % Final   ??? Basophils % 01/21/2021 0.6  % Final   ??? Absolute Neutrophils 01/21/2021 16.8* 1.7 - 7.7 10*9/L Final   ??? Absolute Lymphocytes 01/21/2021 1.2  0.7 - 4.0 10*9/L Final   ??? Absolute Monocytes 01/21/2021 1.9* 0.1 - 1.0 10*9/L Final   ??? Absolute Eosinophils 01/21/2021 0.1  0.0 - 0.7 10*9/L Final   ??? Absolute Basophils 01/21/2021 0.1  0.0 - 0.1 10*9/L Final   ??? Microcytosis 01/21/2021 Slight* Not Present Final   ??? Anisocytosis 01/21/2021 Moderate* Not Present Final   ??? SARS-CoV-2 PCR 01/21/2021 Negative  Negative Final   ??? Blood Culture, Routine 01/21/2021 No Growth at 5 days   Final   ??? Blood Culture, Routine 01/21/2021 No Growth at 5 days   Final   ??? Urine Culture, Comprehensive 01/21/2021 >100,000 CFU/mL Escherichia coli*  Final   ??? Urine Culture, Comprehensive 01/21/2021 >100,000 CFU/mL Enterococcus faecalis*  Corrected   ??? Urine Culture, Comprehensive 01/21/2021 >100,000 CFU/mL Aerococcus urinae*  Final   ??? BNP 01/21/2021 550.47* <=100 pg/mL Final   ??? Glucose, POC 01/21/2021 150  70 - 179 mg/dL Final   ??? EKG Ventricular Rate 01/21/2021 78  BPM Final   ??? EKG Atrial Rate 01/21/2021 78  BPM Final   ??? EKG P-R Interval 01/21/2021 178  ms Final   ??? EKG QRS Duration 01/21/2021 86  ms Final   ??? EKG Q-T Interval 01/21/2021 438  ms Final   ??? EKG QTC Calculation 01/21/2021 499  ms Final   ??? EKG Calculated P Axis 01/21/2021 68  degrees Final   ??? EKG Calculated R Axis 01/21/2021 67  degrees Final   ??? EKG Calculated T Axis 01/21/2021 24  degrees Final   ??? QTC Fredericia 01/21/2021 478  ms Final   ??? Ferritin 01/21/2021 21.4  7.3 - 270.7 ng/mL Final   ??? Iron 01/21/2021 7* 50 - 170 ug/dL Final   ??? TIBC 14/78/2956 282.2  mg/dL Final   ??? Transferrin 01/21/2021 224.0* 250.0 - 380.0 mg/dL Final   ??? Iron Saturation (%) 01/21/2021 2  % Final   ??? Sodium 01/22/2021 137  135 - 145 mmol/L Final   ???  Potassium 01/22/2021 4.2  3.4 - 4.5 mmol/L Final   ??? Chloride 01/22/2021 98  98 - 107 mmol/L Final   ??? CO2 01/22/2021 32.0* 20.0 - 31.0 mmol/L Final   ??? Anion Gap 01/22/2021 7  5 - 14 mmol/L Final   ??? BUN 01/22/2021 20  9 - 23 mg/dL Final   ??? Creatinine 01/22/2021 1.38* 0.60 - 0.80 mg/dL Final   ??? BUN/Creatinine Ratio 01/22/2021 14   Final   ??? EGFR [name]-EPI Non-African American,* 01/22/2021 36* >=60 mL/min/1.63m2 Final   ??? EGFR [name]-EPI African American, Fem* 01/22/2021 42* >=60 mL/min/1.74m2 Final   ??? Glucose 01/22/2021 148  70 - 179 mg/dL Final   ??? Calcium 09/81/1914 9.3  8.7 - 10.4 mg/dL Final   ??? Magnesium 78/29/5621 2.7* 1.6 - 2.6 mg/dL Final   ??? WBC 30/86/5784 14.6* 3.5 - 10.5 10*9/L Final   ??? RBC 01/22/2021 4.31  3.90 - 5.03 10*12/L Final   ??? HGB 01/22/2021 9.2* 12.0 - 15.5 g/dL Final   ??? HCT 69/62/9528 31.5* 35.0 - 44.0 % Final   ??? MCV 01/22/2021 73.1* 82.0 - 98.0 fL Final   ??? MCH 01/22/2021 21.4* 26.0 - 34.0 pg Final   ??? MCHC 01/22/2021 29.3* 30.0 - 36.0 g/dL Final   ??? RDW 41/32/4401 18.6* 12.0 - 15.0 % Final   ??? MPV 01/22/2021 8.0  7.0 - 10.0 fL Final   ??? Platelet 01/22/2021 353  150 - 450 10*9/L Final   ??? nRBC 01/22/2021 0  <=4 /100 WBCs Final   ??? Neutrophils % 01/22/2021 79.2  % Final   ??? Lymphocytes % 01/22/2021 9.6  % Final   ??? Monocytes % 01/22/2021 8.3  % Final   ??? Eosinophils % 01/22/2021 1.7  % Final   ??? Basophils % 01/22/2021 1.2  % Final   ??? Absolute Neutrophils 01/22/2021 11.6* 1.7 - 7.7 10*9/L Final   ??? Absolute Lymphocytes 01/22/2021 1.4  0.7 - 4.0 10*9/L Final   ??? Absolute Monocytes 01/22/2021 1.2* 0.1 - 1.0 10*9/L Final   ??? Absolute Eosinophils 01/22/2021 0.2  0.0 - 0.7 10*9/L Final   ??? Absolute Basophils 01/22/2021 0.2* 0.0 - 0.1 10*9/L Final   ??? Microcytosis 01/22/2021 Slight* Not Present Final   ??? Anisocytosis 01/22/2021 Moderate* Not Present Final   ??? Glucose, POC 01/22/2021 193* 70 - 179 mg/dL Final   ??? Glucose, POC 01/22/2021 206* 70 - 179 mg/dL Final   ??? Glucose, POC 01/22/2021 120  70 - 179 mg/dL Final   ??? Glucose, POC 01/22/2021 162  70 - 179 mg/dL Final   ??? Sodium 02/72/5366 136  135 - 145 mmol/L Final   ??? Potassium 01/23/2021 4.1  3.4 - 4.5 mmol/L Final   ??? Chloride 01/23/2021 98  98 - 107 mmol/L Final   ??? CO2 01/23/2021 32.2* 20.0 - 31.0 mmol/L Final   ??? Anion Gap 01/23/2021 6  5 - 14 mmol/L Final   ??? BUN 01/23/2021 23  9 - 23 mg/dL Final   ??? Creatinine 01/23/2021 1.39* 0.60 - 0.80 mg/dL Final   ??? BUN/Creatinine Ratio 01/23/2021 17   Final   ??? EGFR [name]-EPI Non-African American,* 01/23/2021 36* >=60 mL/min/1.76m2 Final   ??? EGFR [name]-EPI African American, Fem* 01/23/2021 42* >=60 mL/min/1.83m2 Final   ??? Glucose 01/23/2021 114  70 - 179 mg/dL Final   ??? Calcium 44/02/4741 9.1  8.7 - 10.4 mg/dL Final   ??? Magnesium 59/56/3875 2.6  1.6 - 2.6 mg/dL Final   ??? WBC  01/23/2021 11.7* 3.5 - 10.5 10*9/L Final   ??? RBC 01/23/2021 3.94  3.90 - 5.03 10*12/L Final   ??? HGB 01/23/2021 8.4* 12.0 - 15.5 g/dL Final   ??? HCT 47/82/9562 28.8* 35.0 - 44.0 % Final   ??? MCV 01/23/2021 73.1* 82.0 - 98.0 fL Final   ??? MCH 01/23/2021 21.4* 26.0 - 34.0 pg Final   ??? MCHC 01/23/2021 29.3* 30.0 - 36.0 g/dL Final   ??? RDW 13/07/6577 18.0* 12.0 - 15.0 % Final   ??? MPV 01/23/2021 8.2  7.0 - 10.0 fL Final   ??? Platelet 01/23/2021 311  150 - 450 10*9/L Final   ??? nRBC 01/23/2021 0  <=4 /100 WBCs Final   ??? Neutrophils % 01/23/2021 70.7  % Final   ??? Lymphocytes % 01/23/2021 13.3  % Final   ??? Monocytes % 01/23/2021 11.6  % Final   ??? Eosinophils % 01/23/2021 3.3  % Final   ??? Basophils % 01/23/2021 1.1  % Final   ??? Absolute Neutrophils 01/23/2021 8.3* 1.7 - 7.7 10*9/L Final   ??? Absolute Lymphocytes 01/23/2021 1.6  0.7 - 4.0 10*9/L Final   ??? Absolute Monocytes 01/23/2021 1.4* 0.1 - 1.0 10*9/L Final   ??? Absolute Eosinophils 01/23/2021 0.4  0.0 - 0.7 10*9/L Final   ??? Absolute Basophils 01/23/2021 0.1  0.0 - 0.1 10*9/L Final   ??? Microcytosis 01/23/2021 Slight* Not Present Final   ??? Anisocytosis 01/23/2021 Slight* Not Present Final   ??? Glucose, POC 01/23/2021 135  70 - 179 mg/dL Final   ??? Glucose, POC 01/23/2021 234* 70 - 179 mg/dL Final   ??? Glucose, POC 01/23/2021 128  70 - 179 mg/dL Final   ??? Glucose, POC 01/23/2021 149  70 - 179 mg/dL Final   ??? Magnesium 46/96/2952 2.6  1.6 - 2.6 mg/dL Final   ??? WBC 84/13/2440 11.4* 3.5 - 10.5 10*9/L Final   ??? RBC 01/24/2021 3.91  3.90 - 5.03 10*12/L Final   ??? HGB 01/24/2021 8.5* 12.0 - 15.5 g/dL Final   ??? HCT 10/24/2535 28.2* 35.0 - 44.0 % Final   ??? MCV 01/24/2021 72.1* 82.0 - 98.0 fL Final   ??? MCH 01/24/2021 21.8* 26.0 - 34.0 pg Final   ??? MCHC 01/24/2021 30.2  30.0 - 36.0 g/dL Final   ??? RDW 64/40/3474 17.8* 12.0 - 15.0 % Final   ??? MPV 01/24/2021 7.8  7.0 - 10.0 fL Final   ??? Platelet 01/24/2021 300  150 - 450 10*9/L Final   ??? Sodium 01/24/2021 134* 135 - 145 mmol/L Final   ??? Potassium 01/24/2021 4.1  3.4 - 4.5 mmol/L Final   ??? Chloride 01/24/2021 97* 98 - 107 mmol/L Final   ??? CO2 01/24/2021 32.1* 20.0 - 31.0 mmol/L Final   ??? Anion Gap 01/24/2021 5  5 - 14 mmol/L Final   ??? BUN 01/24/2021 27* 9 - 23 mg/dL Final   ??? Creatinine 01/24/2021 1.51* 0.60 - 0.80 mg/dL Final   ??? BUN/Creatinine Ratio 01/24/2021 18   Final   ??? EGFR [name]-EPI Non-African American,* 01/24/2021 33* >=60 mL/min/1.28m2 Final   ??? EGFR [name]-EPI African American, Fem* 01/24/2021 38* >=60 mL/min/1.43m2 Final   ??? Glucose 01/24/2021 141  70 - 179 mg/dL Final   ??? Calcium 25/95/6387 9.1  8.7 - 10.4 mg/dL Final   ??? Glucose, POC 01/24/2021 151  70 - 179 mg/dL Final   Appointment on 01/17/2021   Component Date Value Ref Range Status   ??? Color, UA 01/17/2021 Yellow  Final   ??? Clarity, UA 01/17/2021 Cloudy   Final   ??? Specific Gravity, UA 01/17/2021 1.020  1.005 - 1.040 Final   ??? pH, UA 01/17/2021 8.5  5.0 - 9.0 Final   ??? Leukocyte Esterase, UA 01/17/2021 Moderate* Negative Final   ??? Nitrite, UA 01/17/2021 Negative  Negative Final   ??? Protein, UA 01/17/2021 Negative  Negative Final   ??? Glucose, UA 01/17/2021 >1000 mg/dL* Negative Final   ??? Ketones, UA 01/17/2021 Negative  Negative Final   ??? Urobilinogen, UA 01/17/2021 0.2 mg/dL   Final   ??? Bilirubin, UA 01/17/2021 Negative  Negative Final   ??? Blood, UA 01/17/2021 Moderate* Negative Final   ??? RBC, UA 01/17/2021 5* <4 /HPF Final   ??? WBC, UA 01/17/2021 15* 0 - 5 /HPF Final   ??? Squam Epithel, UA 01/17/2021 20* 0 - 5 /HPF Final   ??? Bacteria, UA 01/17/2021 Many* None Seen /HPF Final   ??? WBC Clumps 01/17/2021 Occasional* None Seen /HPF Final   ??? Urine Culture, Comprehensive 01/17/2021 50,000 to 100,000 CFU/mL Escherichia coli*  Final   ??? Urine Culture, Comprehensive 01/17/2021 50,000 to 100,000 CFU/mL Enterococcus faecalis*  Final   Office Visit on 01/16/2021   Component Date Value Ref Range Status   ??? EKG Ventricular Rate 01/16/2021 98  BPM Final   ??? EKG QRS Duration 01/16/2021 80  ms Final   ??? EKG Q-T Interval 01/16/2021 394  ms Final   ??? EKG QTC Calculation 01/16/2021 503  ms Final   ??? EKG Calculated R Axis 01/16/2021 54  degrees Final   ??? EKG Calculated T Axis 01/16/2021 -28  degrees Final   ??? QTC Fredericia 01/16/2021 464  ms Final   No results displayed because visit has over 200 results.      Admission on 01/05/2021, Discharged on 01/06/2021   Component Date Value Ref Range Status   ??? EKG Ventricular Rate 01/05/2021 130  BPM Final   ??? EKG Atrial Rate 01/05/2021 170  BPM Final   ??? EKG QRS Duration 01/05/2021 78  ms Final   ??? EKG Q-T Interval 01/05/2021 212  ms Final   ??? EKG QTC Calculation 01/05/2021 311  ms Final   ??? EKG Calculated R Axis 01/05/2021 72  degrees Final   ??? EKG Calculated T Axis 01/05/2021 -140  degrees Final   ??? QTC Fredericia 01/05/2021 274  ms Final   ??? Influenza A 01/06/2021 Negative  Negative Final   ??? Influenza B 01/06/2021 Negative  Negative Final   ??? RSV 01/06/2021 Negative  Negative Final   ??? SARS-CoV-2 PCR 01/06/2021 Negative  Negative Final   ??? Magnesium 01/06/2021 2.6  1.6 - 2.6 mg/dL Final   ??? Sodium 24/40/1027 136  135 - 145 mmol/L Final   ??? Potassium 01/06/2021 3.2* 3.4 - 4.5 mmol/L Final   ??? Chloride 01/06/2021 94* 98 - 107 mmol/L Final   ??? Anion Gap 01/06/2021 8  5 - 14 mmol/L Final   ??? CO2 01/06/2021 34.5* 20.0 - 31.0 mmol/L Final   ??? BUN 01/06/2021 29* 9 - 23 mg/dL Final   ??? Creatinine 01/06/2021 1.28* 0.60 - 0.80 mg/dL Final   ??? BUN/Creatinine Ratio 01/06/2021 23   Final   ??? EGFR [name]-EPI Non-African American,* 01/06/2021 40* >=60 mL/min/1.83m2 Final   ??? EGFR [name]-EPI African American, Fem* 01/06/2021 46* >=60 mL/min/1.34m2 Final   ??? Glucose 01/06/2021 212* 70 - 179 mg/dL Final   ??? Calcium 25/36/6440 9.1  8.7 -  10.4 mg/dL Final   ??? Albumin 16/09/9603 3.2* 3.4 - 5.0 g/dL Final   ??? Total Protein 01/06/2021 7.3  5.7 - 8.2 g/dL Final   ??? Total Bilirubin 01/06/2021 0.6  0.3 - 1.2 mg/dL Final   ??? AST 54/08/8118 15  <=34 U/L Final   ??? ALT 01/06/2021 12  10 - 49 U/L Final   ??? Alkaline Phosphatase 01/06/2021 84  46 - 116 U/L Final   ??? hsTroponin I 01/06/2021 54* <=34 ng/L Final   ??? WBC 01/06/2021 13.8* 3.5 - 10.5 10*9/L Final   ??? RBC 01/06/2021 4.08  3.90 - 5.03 10*12/L Final   ??? HGB 01/06/2021 9.0* 12.0 - 15.5 g/dL Final   ??? HCT 14/78/2956 29.7* 35.0 - 44.0 % Final   ??? MCV 01/06/2021 72.8* 82.0 - 98.0 fL Final   ??? MCH 01/06/2021 22.1* 26.0 - 34.0 pg Final   ??? MCHC 01/06/2021 30.4  30.0 - 36.0 g/dL Final   ??? RDW 21/30/8657 17.8* 12.0 - 15.0 % Final   ??? MPV 01/06/2021 8.8  7.0 - 10.0 fL Final   ??? Platelet 01/06/2021 280 150 - 450 10*9/L Final   ??? nRBC 01/06/2021 0  <=4 /100 WBCs Final   ??? Neutrophils % 01/06/2021 76.8  % Final   ??? Lymphocytes % 01/06/2021 8.0  % Final   ??? Monocytes % 01/06/2021 13.1  % Final   ??? Eosinophils % 01/06/2021 1.3  % Final   ??? Basophils % 01/06/2021 0.8  % Final   ??? Absolute Neutrophils 01/06/2021 10.6* 1.7 - 7.7 10*9/L Final   ??? Absolute Lymphocytes 01/06/2021 1.1  0.7 - 4.0 10*9/L Final   ??? Absolute Monocytes 01/06/2021 1.8* 0.1 - 1.0 10*9/L Final   ??? Absolute Eosinophils 01/06/2021 0.2  0.0 - 0.7 10*9/L Final   ??? Absolute Basophils 01/06/2021 0.1  0.0 - 0.1 10*9/L Final   ??? Microcytosis 01/06/2021 Slight* Not Present Final   ??? Anisocytosis 01/06/2021 Slight* Not Present Final   ??? TSH 01/06/2021 1.572  0.550 - 4.780 uIU/mL Final   ??? Color, UA 01/06/2021 Yellow   Final   ??? Clarity, UA 01/06/2021 Clear   Final   ??? Specific Gravity, UA 01/06/2021 1.015  1.005 - 1.040 Final   ??? pH, UA 01/06/2021 7.0  5.0 - 9.0 Final   ??? Leukocyte Esterase, UA 01/06/2021 Negative  Negative Final   ??? Nitrite, UA 01/06/2021 Negative  Negative Final   ??? Protein, UA 01/06/2021 Negative  Negative Final   ??? Glucose, UA 01/06/2021 500 mg/dL* Negative Final   ??? Ketones, UA 01/06/2021 Negative  Negative Final   ??? Urobilinogen, UA 01/06/2021 0.2 mg/dL   Final   ??? Bilirubin, UA 01/06/2021 Negative  Negative Final   ??? Blood, UA 01/06/2021 Negative  Negative Final   ??? RBC, UA 01/06/2021 <1  <4 /HPF Final   ??? WBC, UA 01/06/2021 4  0 - 5 /HPF Final   ??? Squam Epithel, UA 01/06/2021 23* 0 - 5 /HPF Final   ??? Bacteria, UA 01/06/2021 Rare* None Seen /HPF Final   ??? Hyphal Yeast 01/06/2021 Occasional* None Seen /HPF Final   ??? Hyaline Casts, UA 01/06/2021 <1* <=0 /LPF Final   ??? Yeast, UA 01/06/2021 Many* None Seen /HPF Final   ??? Mucus, UA 01/06/2021 Rare* None Seen /HPF Final   ??? Specimen Source 01/06/2021 Venous   Final   ??? FIO2 Venous 01/06/2021 Not Specified   Final   ??? pH, Venous 01/06/2021 7.45* 7.32 -  7.43 Final   ??? pCO2, Ven 01/06/2021 56 40 - 60 mm Hg Final   ??? pO2, Ven 01/06/2021 77* 30 - 55 mm Hg Final   ??? HCO3, Ven 01/06/2021 37* 22 - 27 mmol/L Final   ??? Base Excess, Ven 01/06/2021 15.2* -2.0 - 2.0 Final   ??? O2 Saturation, Venous 01/06/2021 95.6* 40.0 - 85.0 % Final   ??? hsTroponin I 01/06/2021 61* <=34 ng/L Final   ??? Urine Culture, Comprehensive 01/06/2021 Mixed Urogenital Flora   Final   Appointment on 01/03/2021   Component Date Value Ref Range Status   ??? Sodium 01/03/2021 135  135 - 145 mmol/L Final   ??? Potassium 01/03/2021 3.7  3.4 - 4.5 mmol/L Final   ??? Chloride 01/03/2021 94* 98 - 107 mmol/L Final   ??? CO2 01/03/2021 35.0* 20.0 - 31.0 mmol/L Final   ??? Anion Gap 01/03/2021 6  5 - 14 mmol/L Final   ??? BUN 01/03/2021 37* 9 - 23 mg/dL Final   ??? Creatinine 01/03/2021 1.45* 0.60 - 0.80 mg/dL Final   ??? BUN/Creatinine Ratio 01/03/2021 26   Final   ??? EGFR [name]-EPI Non-African American,* 01/03/2021 34* >=60 mL/min/1.73m2 Final   ??? EGFR [name]-EPI African American, Fem* 01/03/2021 40* >=60 mL/min/1.1m2 Final   ??? Glucose 01/03/2021 248* 70 - 179 mg/dL Final   ??? Calcium 16/09/9603 9.7  8.7 - 10.4 mg/dL Final   ??? BNP 54/08/8118 568.33* <=100 pg/mL Final   ??? Color, UA 01/03/2021 Yellow   Final   ??? Clarity, UA 01/03/2021 Clear   Final   ??? Specific Gravity, UA 01/03/2021 1.010  1.005 - 1.030 Final   ??? pH, UA 01/03/2021 6.0  5.0 - 9.0 Final   ??? Leukocyte Esterase, UA 01/03/2021 Negative  Negative Final   ??? Nitrite, UA 01/03/2021 Negative  Negative Final   ??? Protein, UA 01/03/2021 Negative  Negative Final   ??? Glucose, UA 01/03/2021 >1000 mg/dL* Negative Final   ??? Ketones, UA 01/03/2021 Negative  Negative Final   ??? Urobilinogen, UA 01/03/2021 0.2 mg/dL  0.2 - 2.0 mg/dL Final   ??? Bilirubin, UA 01/03/2021 Negative  Negative Final   ??? Blood, UA 01/03/2021 Negative  Negative Final   ??? RBC, UA 01/03/2021 <1  0 - 3 /HPF Final   ??? WBC, UA 01/03/2021 20* 0 - 3 /HPF Final   ??? Squam Epithel, UA 01/03/2021 4  0 - 5 /HPF Final   ??? Bacteria, UA 01/03/2021 Many* None Seen /HPF Final   ??? WBC Clumps 01/03/2021 Rare* None Seen /HPF Final   ??? Urine Culture, Comprehensive 01/03/2021 >100,000 CFU/mL Escherichia coli*  Final   Appointment on 12/23/2020   Component Date Value Ref Range Status   ??? DNA Test Name 12/23/2020 SEE COMMENT   Final   ??? Miscellaneous DNA Result 12/23/2020 SEE COMMENT   Final   ??? Sodium 12/23/2020 139  135 - 145 mmol/L Final   ??? Potassium 12/23/2020 3.8  3.4 - 4.5 mmol/L Final   ??? Chloride 12/23/2020 99  98 - 107 mmol/L Final   ??? CO2 12/23/2020 30.6  20.0 - 31.0 mmol/L Final   ??? Anion Gap 12/23/2020 9  5 - 14 mmol/L Final   ??? BUN 12/23/2020 31* 9 - 23 mg/dL Final   ??? Creatinine 12/23/2020 1.42* 0.60 - 0.80 mg/dL Final   ??? BUN/Creatinine Ratio 12/23/2020 22   Final   ??? EGFR [name]-EPI Non-African American,* 12/23/2020 35* >=60 mL/min/1.82m2 Final   ??? EGFR [name]-EPI  African American, Fem* 12/23/2020 41* >=60 mL/min/1.21m2 Final   ??? Glucose 12/23/2020 240* 70 - 179 mg/dL Final   ??? Calcium 16/09/9603 9.3  8.7 - 10.4 mg/dL Final   ??? PRO-BNP 54/08/8118 3,796.0* 0.0 - 450.0 pg/mL Final   Office Visit on 12/12/2020   Component Date Value Ref Range Status   ??? HGB A1C, POC 12/12/2020 7.4* <7.0 % Final   ??? EST AVERAGE GLUCOSE, POC 12/12/2020 166  mg/dL Final   ??? Sodium 14/78/2956 140  135 - 145 mmol/L Final   ??? Potassium 12/12/2020 3.7  3.4 - 4.5 mmol/L Final   ??? Chloride 12/12/2020 102  98 - 107 mmol/L Final   ??? CO2 12/12/2020 31.5* 20.0 - 31.0 mmol/L Final   ??? Anion Gap 12/12/2020 7  5 - 14 mmol/L Final   ??? BUN 12/12/2020 27* 9 - 23 mg/dL Final   ??? Creatinine 12/12/2020 1.32* 0.60 - 0.80 mg/dL Final   ??? BUN/Creatinine Ratio 12/12/2020 20   Final   ??? EGFR [name]-EPI Non-African American,* 12/12/2020 38* >=60 mL/min/1.60m2 Final   ??? EGFR [name]-EPI African American, Fem* 12/12/2020 44* >=60 mL/min/1.40m2 Final   ??? Glucose 12/12/2020 193* 70 - 179 mg/dL Final   ??? Calcium 21/30/8657 9.6  8.7 - 10.4 mg/dL Final   ??? BNP 12/12/2020 566.68* <=100 pg/mL Final   ??? Hepatitis C Ab 12/12/2020 Nonreactive Nonreactive Final   ??? Creat U 12/12/2020 30.9  Undefined mg/dL Final   ??? Albumin Quantitative, Urine 12/12/2020 0.9  Undefined mg/dL Final   ??? Albumin/Creatinine Ratio 12/12/2020 29.1  0.0 - 30.0 ug/mg Final   There may be more visits with results that are not included.       Lab Results   Component Value Date    PRO-BNP 3,796.0 (H) 12/23/2020    PRO-BNP 1,970.0 (H) 05/30/2020    PRO-BNP 3,200.0 (H) 04/27/2020    Creatinine 1.37 (H) 02/04/2021    Creatinine 1.51 (H) 01/24/2021    Creatinine 0.79 01/06/2011    BUN 32 (H) 02/04/2021    BUN 27 (H) 01/24/2021    BUN 20 01/06/2011    Sodium 139 02/04/2021    Sodium 140 01/06/2011    Potassium 4.6 (H) 02/04/2021    Potassium 4.1 01/06/2011    CO2 32.0 (H) 02/04/2021    CO2 30 01/06/2011    Magnesium 2.6 01/24/2021    Magnesium 2.0 01/06/2011    Total Bilirubin 0.6 01/07/2021    INR 2.27 09/27/2020    INR 1.0 01/06/2011       Lab Results   Component Value Date    Digoxin Level 0.9 10/01/2020       Lab Results   Component Value Date    TSH 1.572 01/06/2021    Cholesterol 132 08/23/2020    Triglycerides 109 08/23/2020    HDL 32 (L) 08/23/2020    Non-HDL Cholesterol 100 08/23/2020    LDL Calculated 78 08/23/2020       Lab Results   Component Value Date    WBC 12.5 (H) 02/04/2021    WBC 12.5 (H) 01/06/2011    HGB 9.4 (L) 02/04/2021    HGB 12.9 01/06/2011    HCT 32.1 (L) 02/04/2021    HCT 40.8 01/06/2011    Platelet 272 02/04/2021    Platelet 260 01/06/2011       Pertinent Test Results from Today:  None    Other pertinent records were reviewed.    The following are further history from the patient's EPIC  record for reference:     Past Medical History:   Diagnosis Date   ??? Acute on chronic diastolic (congestive) heart failure (CMS-HCC) 08/23/2020   ??? Arthritis    ??? Calculus of kidney    ??? Calculus of ureter    ??? CHF (congestive heart failure) (CMS-HCC)    ??? COPD (chronic obstructive pulmonary disease) (CMS-HCC)    ??? Diabetes (CMS-HCC)    ??? Gangrenous cholecystitis 10/11/2020   ??? GERD (gastroesophageal reflux disease)    ??? Hydronephrosis    ??? Hypertension    ??? Intermediate coronary syndrome (CMS-HCC) 03/13/2014   ??? Lumbar stenosis    ??? Microscopic hematuria    ??? Nausea alone    ??? Neuropathy    ??? Nocturia    ??? Other chronic cystitis    ??? Pulmonary hypertension (CMS-HCC)    ??? Renal colic    ??? Sleep apnea    ??? Unstable angina pectoris (CMS-HCC) 03/13/2014       Past Surgical History:   Procedure Laterality Date   ??? BACK SURGERY  1995   ??? CARPAL TUNNEL RELEASE Left 2014   ??? HYSTERECTOMY  1971   ??? IR INSERT CHOLECYSTOSMY TUBE PERCUTANEOUS  10/02/2020    IR INSERT CHOLECYSTOSMY TUBE PERCUTANEOUS 10/02/2020 Braulio Conte, MD IMG VIR H&V [facility]   ??? LUMBAR DISC SURGERY     ??? PR REMOVAL GALLBLADDER N/A 10/06/2020    Procedure: CHOLECYSTECTOMY;  Surgeon: Katherina Mires, MD;  Location: MAIN OR [facility];  Service: Trauma   ??? PR RIGHT HEART CATH O2 SATURATION & CARDIAC OUTPUT N/A 09/30/2020    Procedure: Right Heart Catheterization;  Surgeon: Neal Dy, MD;  Location: [facility] CATH;  Service: Cardiology   ??? PR RIGHT HEART CATH O2 SATURATION & CARDIAC OUTPUT N/A 01/09/2021    Procedure: Right Heart Catheterization;  Surgeon: Lesle Reek, MD;  Location: [facility] CATH;  Service: Cardiology

## 2021-02-21 NOTE — Unmapped (Signed)
[facility] at [facility]  Pulmonary Hypertension Program                        Barbara Haw, MD???Director (Pulmonary)  Denice Bors. [name], MD--Associate Director (Pulmonary)  Freeman Caldron, MD (Cardiology)  Bonney Leitz, MD (Pulmonary)    [name], RN Nurse Coordinator  Claretha Cooper, RN Nurse Coordinator      [phone] office  [phone] fax            Barbara Huber  is a 80 y.o. female  patient referred for work up and evaluation for pulmonary hypertension, COPD, and chronic hypoxemic respiratory failure.    Assessment:      Barbara Huber is a 80 y.o.female with COPD and chronic hypoxemic respiratory failure. She was previously referred to me for pulmonary hypertension, though this is now clearly dominant left heart/group 2 pathology (with small precapillary component, either secondary to [name] versus COPD).  ??  Barbara Huber continues to follow closely with heart failure, with recent diagnosis of ATTR cardiac amyloid. In setting of recent admissions, had been more hypoxemic than prior, though this has notably improved recently. Discussed complex interplay between heart failure and COPD, and will be challenging to support her oxygenation if cardiac disease progresses or becomes decompensated. Not sure what has made such a positive difference since last visit, perhaps change to non-DPI inhaler, or start of tafamidis and ongoing volume management. We previously discussed potential needs for higher flows of oxygen with Trilogy at nighttime, though very happy with her improvements in daytime oxygen now; she has actually lowered her O2 flows at night back to prior baseline.      Plan:         - Continue non-DPI inhaler plan (Spiriva Respimat and Advair HFA) for improved drug delivery of COPD therapies in setting of impaired lung function. Albuterol use should be PRN.  - Continue Trilogy plus oxygen (4LPM) at night unchanged. I had previously discussed testing options with sleep lab, if this would be needed to adjust settings and/or qualify patient for higher flow oxygen concentrator (above 5LPM). She would be able to have sleep study using her Trilogy/AVAPS settings, would just require specific planning to also include RT presence, and patient would need to bring home Trilogy with her. Will hold off on this for now given improvements.  - Discussed whether or not to repeat overnight oximetry on Trilogy/current O2 flow to ensure adequacy; will hold off on this for now but discuss again next visit.  - Consider future pulmonary rehab, discussing further in future visits.  - Continue close followup with Dr. Wonda Cheng and heart failure team. Diuretic dosing as per this team.  ??  ??  Followup in 3 months, with [facility].       Barbara Byars, MD  02/21/21  Subjective:        80 y.o. 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 Meredeth Ide. Recalls COPD diagnosis dating back more than 10 years ago, no history of asthma or other lung conditions. Overall it sounds like she has been maintained on triple inhaler therapy (Advair diskus, Spiriva handihaler), with exacerbations averaging maybe once/year, usually not requiring hospitalization. Albuterol PRN via MDI (tries to use BID by routine) or neb (rare use). In 06/2019 she was admitted to [facility] with left sided pneumonia, also treated for COPD exacerbation with steroids during this, course also complicated by AKI and Afib/RVR. Hypercapnic during admission and started on Trilogy for discharge. She also carries diagnosis of CHF, made a few years ago, at least 2 admissions (usually [place]) for diuresis. With recent admission did have very elevated proBNPs and discharged on higher dose of Lasix (40 mg daily). Denies recent edema on this higher dose of diuretic. No orthopnea. She has not been doing a lot of physical activity during COVID, will notice very mild dyspnea with self care, no exertional CP or LH. Does find neuropathy can hinder her activity as well. Occasional cough when not having exacerbation, clear sputum. Did have hemoptysis with pneumonia, none outside of this. With first visit we discussed recommendation to repeat CXR, and also repeat TTE in stable outpatient state.    Subsequent history: At visit 10/2019 doing okay, occasional episodes of DOE but overall happy with level of function. Discussed potential for clinical trial enrollment, but she was feeling very well and did not want to do invasive testing unless changing. Admitted to [facility] 04/2020 for dyspnea and decompensated heart failure, diuresed and also given IV iron. At followup 06/2020 not quite back at baseline, but improving steadily. She was then admitted 07/2020 again for heart failure, and then again in 09/2020 for two weeks with decompensated heart failure, Afib/RVR, septic shock, and gangrenous cholecystitis. Shortly thereafter she was diagnosed with ATTR cardiac amyloid. Admitted 12/2020 with dyspnea and decompensated heart failure, diuresed and underwent DCCV. At followup that month slowing improving since discharge, though more hypoxemic than baseline, discussed anemia/iron and changed inhalers to non-DPI formulations.    Interval history 01/2021: Since last visit  Feeling much better overall, up cooking and cleaning.  Walked into clinic pushing wheelchair instead of sitting in it today.  A lot of energy. Noting improved SpO2, high 90s on 3LPM, decreasing at times.  Night oxygen with Trilogy now at 4LPM.  Started Tafamidis maybe 2 weeks ago, no side effects.  No palpitations.  Changed inhalers to non-DPI Advair and Spiriva, feels good with these.   Still using albuterol BID by routine.       Past medical, past surgical, family, and social histories reviewed and updated in Epic.    Review of systems is significant for:   A 12 point review of systems was negative except for pertinent items noted in the HPI.    Allergies   Allergen Reactions   ??? Nitrofurantoin Anaphylaxis and Other (See Comments)   ??? Lipitor [Atorvastatin] Muscle Pain     SOB, Headache, fatigue, sick on my stomach      Current Outpatient Medications   Medication Sig Dispense Refill   ??? albuterol HFA 90 mcg/actuation inhaler Inhale 2 puffs every eight (8) hours as needed for wheezing.     ??? amiodarone (PACERONE) 200 MG tablet Take 1 tablet (200 mg total) by mouth daily. 30 tablet  11   ??? apixaban (ELIQUIS) 5 mg Tab Take 1 tablet (5 mg total) by mouth Two (2) times a day. 60 tablet 11   ??? fluticasone propion-salmeteroL (ADVAIR HFA) 115-21 mcg/actuation inhaler Inhale 2 puffs Two (2) times a day. 12 g 11   ??? gabapentin (NEURONTIN) 800 MG tablet Take 1 tablet (800 mg total) by mouth Three (3) times a day. 270 tablet 3   ??? metFORMIN (GLUCOPHAGE) 500 MG tablet Take 1 tablet (500 mg total) by mouth 2 (two) times a day with meals. 180 tablet 3   ??? montelukast (SINGULAIR) 10 mg tablet Take 1 tablet (10 mg total) by mouth daily. 90 tablet 3   ??? oxyCODONE (OXYCONTIN) 10 mg TR12 12 hr crush resistant ER/CR tablet Take 10 mg by mouth every twelve (12) hours.     ??? OXYGEN-AIR DELIVERY SYSTEMS MISC 5 L by Miscellaneous route.     ??? rosuvastatin (CRESTOR) 5 MG tablet Take 1 tablet (5 mg total) by mouth every other day. 3015 tablet 11   ??? spironolactone (ALDACTONE) 50 MG tablet Take 1 tablet (50 mg total) by mouth daily. 90 tablet 3   ??? tiotropium bromide (SPIRIVA RESPIMAT) 2.5 mcg/actuation inhalation mist Inhale 2 puffs daily. 4 g 11   ??? ACCU-CHEK AVIVA PLUS TEST STRP Strp USE TO CHECK BLOOD SUGAR 3 TIMES A DAY BEFORE MEALS     ??? acetaminophen (TYLENOL) 500 MG tablet Take 1,000 mg by mouth daily as needed for pain.      ??? lancets Misc 1 each by Miscellaneous route daily. Accu Check 100 each 6   ??? NARCAN 4 mg/actuation nasal spray 1 spray into alternating nostrils once as needed (opioid overdose).  (Patient not taking: Reported on 02/04/2021)     ??? tafamidis 61 mg cap Take 1 capsule (61 mg) by mouth daily. 30 capsule 11   ??? torsemide (DEMADEX) 20 MG tablet Take 2 tablets (40 mg total) by mouth daily. May also take 2 tablets (40 mg total) daily as needed (weight gain). 60 tablet 11     No current facility-administered medications for this visit.   f        Objective:   Objective     Physical Exam:   BP 125/66  - Pulse 78  - Temp 36.7 ??C (98.1 ??F)  - Wt 97.9 kg (215 lb 12.8 oz)  - SpO2 98% Comment: 3L - BMI 35.91 kg/m??   Body mass index is 35.91 kg/m??.    Wt Readings from Last 6 Encounters:   02/21/21 97.9 kg (215 lb 12.8 oz)   02/21/21 97.5 kg (215 lb)   02/05/21 95.3 kg (210 lb)   02/04/21 95.4 kg (210 lb 6.4 oz)   01/24/21 96.2 kg (212 lb)   01/21/21 97.1 kg (214 lb)     General Appearance:    Alert, cooperative, no distress, appears stated age.   HEENT:    Normocephalic, without obvious abnormality, atraumatic. PERRL, conjunctiva clear. Nares normal, no drainage, no sinus tenderness. Oropharynx with no lesions. No facial telangectasias.   Neck:    Supple, trachea midline, no adenopathy.    Lungs:    Distant breath sounds b/l, no crackles or wheezes, respirations unlabored. No PA bruits.   Chest wall:   Expands symmetrically, no tenderness or deformity.   Cardiovascular:   Regular rate and rhythm, S1 and S2 normal, P2 not overly prominent, no murmur, rubs, or gallop appreciated. Neck exam with prominent carotid impulse, JVD not visible when  upright.   Abdomen:   Soft, non-tender, normal bowel sounds, no masses, no organomegaly   Extremities:     Extremities normal, no cyanosis, clubbing. Trace pitting edema. No sclerodactyly.   Skin:   No rashes or lesions.   Neuro/psych:      No focal neurologic deficits; mood/affect normal..     Diagnostic Review:    Cardiac testing summary:  RHC 01/09/21 (reviewed): RA 23, PA 85/40 (55), PCW 32       CO/CI 5.3/2.5 (f,td), PA sat 57%, PVR 4.2 WU  TTE 09/30/20: LA mild dil, EF >55%       RV ULN size, HK, tr TR, can't est PASP       No effusion. IVC dil, poor insp collapse  TTE 04/30/20: LA 41, EF >55%, LVH (16)       RV normal, mild HK, TAPSE 25, can't est PASP       No effusion. IVC normal.  TTE 10/2019: LA 42, EF >55%       RV mild dil/HK, can't est PASP       No effusion. IVC dil, poor insp collapse  TTE 06/2019: LA 38, EF 65-70%, grade 2 DD, LVH (13-14)       RV mild dil, low nl fxn, TAPSE 17, mild-mod Tr, est PASP 64+CVP       Tr effusion. IVC nl size, poor insp collapse  TTE 04/2018 (cone): LA 40, EF 60-65%, grade 1 DD       RV normal, mild TR, est PASP within the normal range       No effusion. IVC normal    CXR 09/10/20: cardiomegaly       Pulmonary vascular congestion with increased markings       Hyperinflated lungs, flat diagphragms  CXR 04/27/20: cardiomegaly       Hazy interstitial opacities with vascular congestion       Likely trace L effusion.  CXR 08/2019: ??stable cardiomegaly       Interval resolution of LLL consolidation.       Stable parenchymal scarring in the left midlung zone.       Interval decreased now trace left pleural effusion.    CTA chest 10/2018 (cone): Cardiovascular: There is no demonstrable pulmonary embolus. There is  no thoracic aortic aneurysm or dissection. There is calcification at  the origin of the left subclavian artery. Other visualized great  vessels appear unremarkable. There is aortic atherosclerosis. There  are foci of coronary artery calcification evident. There is a small  amount of pericardial fluid, within the physiologic range, stable.  Pericardium does not appear appreciably thickened.  Prominence of the main pulmonary outflow tract is noted with a  measured diameter of 3.7 cm.  Mediastinum/Nodes: There is a subcentimeter nodular opacity in the  left lobe of the thyroid, stable. There are multiple subcentimeter  mediastinal lymph nodes again noted. There are prominent right  paratracheal lymph nodes with largest lymph node in this area  measuring 1.8 x 1.4 cm, marginally smaller than on prior study.  Aortopulmonary window lymph nodes appear slightly smaller than on  previous study. No new lymph node prominence is evident on this  study. No esophageal lesions are evident.  Lungs/Pleura: There is atelectatic change in the lung base regions,  stable. There is no frank edema or consolidation. A mild degree of  centrilobular emphysematous change appears stable. No appreciable  pleural effusion.  Upper Abdomen: There is a cyst in the medial upper pole right kidney  measuring 1.5 x 1.5 cm. A cyst is noted in the lateral mid left  kidney measuring 1.5 x 1.3 cm. There is aortic atherosclerosis as  well as calcification in the proximal visualized great vessels.  Visualized upper abdominal structures otherwise appear unremarkable.  Musculoskeletal: There is stable anterior wedging of the L1  vertebral body. There is multilevel degenerative change in the  thoracic spine which appear stable. No blastic or lytic bone lesions  are evident. No evident chest wall lesions.     CT chest 04/2018 (cone): Cardiovascular: Mild cardiomegaly. Small pericardial  effusion/thickening. Left anterior descending and right coronary  atherosclerosis. Atherosclerotic nonaneurysmal thoracic aorta.  Dilated main pulmonary artery (3.6 cm diameter). No central  pulmonary emboli.  Mediastinum/Nodes: Subcentimeter hypodense posterior left thyroid  lobe nodule. Unremarkable esophagus. No axillary adenopathy.  Enlarged right paratracheal nodes up to 1.6 cm (series 2/image 54).  Enlarged 1.1 cm AP window node (series 2/image 51). No  pathologically enlarged hilar nodes.  Lungs/Pleura: No pneumothorax. Trace dependent left pleural  effusion. No right pleural effusion. No acute consolidative airspace  disease, lung masses or significant pulmonary nodules. Minimal  interlobular septal thickening throughout both lungs. Several mildly  thickened parenchymal bands in the dependent right middle lobe,  lingula and right lower lobe. No significant regions of  bronchiectasis. Mild centrilobular emphysema with mild diffuse  bronchial wall thickening.  Upper abdomen: Partially visualized simple 1.6 cm medial upper right  renal cyst. Mild scarring in the visualized upper right kidney.  Musculoskeletal: No aggressive appearing focal osseous lesions.  Stable mild chronic L1 vertebral compression fracture. Moderate  thoracic spondylosis.     V/Q scan : none    08/2019: 152 meters. HR 79->101, O2 sat 91->88% on 3LPM, needing 4LPM. Max Borg 2.    Pulmonary Function Testing:  Date FEV1 FVC TLC FRC RV DLCO             08/2019  0.99 (56%)  1.83 (79%)     5.0 (26%)    04/2018  0.98 (59%)  1.88 (89%)  68%   63%   (29%)              Overnight oximetry 08/2019: 20 min <89% (total time 6.5 hours), avg 90%, nadir 94%       Compliance data with very good usage  PSG : done in past but not available     Relevant Serologies:   Labs 08/2019: ANA pos 1:160 homogenous, ENA neg, RF/CCP neg  Labs 12/2018: ANCA neg, ACE 31    Routine Labs:     ??  Date NTproBNP Creatinine  Potassium  Bicarbonate  Hemoglobin  LFTs    12/2020 BNP 926->479 1.3 3.8  33  8.9 ??   11/2020  3800   BNP 567 ?? ?? ?? ?? ??    07/2020  BNP 603 ?? ?? ?? ?? ??    05/2020  1970  1.3  4.3  32  11.2 ??   10/2019  2220  1.1 ??  34 ?? ??    08/2019  1410 ?? ?? ?? ?? ??    07/2019  1270  0.9  4.1  29  10.0  WNL    06/2019  6600->13K ?? ?? ?? ?? ??   11/2018  BNP 253 ?? ?? ?? ?? ??    08/2018  BNP 531

## 2021-02-21 NOTE — Unmapped (Addendum)
Thank you for visiting the [facility] Pulmonary Hypertension Clinic.    If you have any questions or concerns, please call     Haze Justin, MD  [name], RN, Pulmonary Hypertension Nurse Coordinator  Claretha Cooper, RN, Pulmonary Hypertension Nurse Coordinator    [phone].          1. No changes today.

## 2021-02-24 MED FILL — VYNDAMAX 61 MG CAPSULE: ORAL | 30 days supply | Qty: 30 | Fill #1

## 2021-02-26 ENCOUNTER — Ambulatory Visit
Admit: 2021-02-26 | Discharge: 2021-02-27 | Disposition: A | Discharge status: Home / Self Care | Payer: MEDICARE | Attending: Emergency Medicine

## 2021-02-26 DIAGNOSIS — N3000 Acute cystitis without hematuria: Principal | ICD-10-CM

## 2021-02-26 LAB — URINALYSIS WITH CULTURE REFLEX
BILIRUBIN UA: NEGATIVE
GLUCOSE UA: NEGATIVE
KETONES UA: NEGATIVE
NITRITE UA: POSITIVE — AB
PH UA: 7 (ref 5.0–9.0)
PROTEIN UA: 30 — AB
RBC UA: >100 /HPF — ABNORMAL HIGH (ref <4)
SPECIFIC GRAVITY UA: 1.015 (ref 1.005–1.040)
SQUAMOUS EPITHELIAL: 3 /HPF (ref 0–5)
UROBILINOGEN UA: 0.2
WBC UA: 20 /HPF — ABNORMAL HIGH (ref 0–5)

## 2021-02-26 LAB — CBC W/ AUTO DIFF
BASOPHILS ABSOLUTE COUNT: 0.1 10*9/L (ref 0.0–0.1)
BASOPHILS RELATIVE PERCENT: 0.4 %
EOSINOPHILS ABSOLUTE COUNT: 0.1 10*9/L (ref 0.0–0.5)
EOSINOPHILS RELATIVE PERCENT: 0.9 %
HEMATOCRIT: 33.4 % — ABNORMAL LOW (ref 34.0–44.0)
HEMOGLOBIN: 10.5 g/dL — ABNORMAL LOW (ref 11.3–14.9)
LYMPHOCYTES ABSOLUTE COUNT: 1.1 10*9/L (ref 1.1–3.6)
LYMPHOCYTES RELATIVE PERCENT: 7.6 %
MEAN CORPUSCULAR HEMOGLOBIN CONC: 31.5 g/dL — ABNORMAL LOW (ref 32.0–36.0)
MEAN CORPUSCULAR HEMOGLOBIN: 25.5 pg — ABNORMAL LOW (ref 25.9–32.4)
MEAN CORPUSCULAR VOLUME: 80.9 fL (ref 77.6–95.7)
MEAN PLATELET VOLUME: 9.3 fL (ref 6.8–10.7)
MONOCYTES ABSOLUTE COUNT: 1.3 10*9/L — ABNORMAL HIGH (ref 0.3–0.8)
MONOCYTES RELATIVE PERCENT: 9.1 %
NEUTROPHILS ABSOLUTE COUNT: 11.7 10*9/L — ABNORMAL HIGH (ref 1.8–7.8)
NEUTROPHILS RELATIVE PERCENT: 82 %
NUCLEATED RED BLOOD CELLS: 0 /100{WBCs} (ref <=4)
PLATELET COUNT: 177 10*9/L (ref 150–450)
RED BLOOD CELL COUNT: 4.13 10*12/L (ref 3.95–5.13)
RED CELL DISTRIBUTION WIDTH: 25.9 % — ABNORMAL HIGH (ref 12.2–15.2)
WBC ADJUSTED: 14.2 10*9/L — ABNORMAL HIGH (ref 3.6–11.2)

## 2021-02-26 LAB — COMPREHENSIVE METABOLIC PANEL
ALBUMIN: 3.7 g/dL (ref 3.4–5.0)
ALKALINE PHOSPHATASE: 73 U/L (ref 46–116)
ALT (SGPT): 9 U/L — ABNORMAL LOW (ref 10–49)
ANION GAP: 8 mmol/L (ref 5–14)
AST (SGOT): 17 U/L (ref <=34)
BILIRUBIN TOTAL: 0.5 mg/dL (ref 0.3–1.2)
BLOOD UREA NITROGEN: 28 mg/dL — ABNORMAL HIGH (ref 9–23)
BUN / CREAT RATIO: 18
CALCIUM: 9 mg/dL (ref 8.7–10.4)
CHLORIDE: 102 mmol/L (ref 98–107)
CO2: 28.3 mmol/L (ref 20.0–31.0)
CREATININE: 1.57 mg/dL — ABNORMAL HIGH
EGFR CKD-EPI AA FEMALE: 36 mL/min/{1.73_m2} — ABNORMAL LOW (ref >=60)
EGFR CKD-EPI NON-AA FEMALE: 31 mL/min/{1.73_m2} — ABNORMAL LOW (ref >=60)
GLUCOSE RANDOM: 118 mg/dL (ref 70–179)
POTASSIUM: 4.1 mmol/L (ref 3.4–4.8)
PROTEIN TOTAL: 7.5 g/dL (ref 5.7–8.2)
SODIUM: 138 mmol/L (ref 135–145)

## 2021-02-26 LAB — SLIDE REVIEW

## 2021-02-26 LAB — LACTATE, VENOUS, WHOLE BLOOD: LACTATE BLOOD VENOUS: 1.1 mmol/L (ref 0.5–1.8)

## 2021-02-26 MED ORDER — SULFAMETHOXAZOLE 800 MG-TRIMETHOPRIM 160 MG TABLET
ORAL_TABLET | Freq: Two times a day (BID) | ORAL | 0 refills | 7 days | Status: CP
Start: 2021-02-26 — End: 2021-03-05

## 2021-02-27 ENCOUNTER — Ambulatory Visit
Admit: 2021-02-27 | Discharge: 2021-02-27 | Disposition: A | Discharge status: Home / Self Care | Payer: MEDICARE | Attending: Family

## 2021-02-27 DIAGNOSIS — R319 Hematuria, unspecified: Principal | ICD-10-CM

## 2021-02-27 DIAGNOSIS — N390 Urinary tract infection, site not specified: Principal | ICD-10-CM

## 2021-02-27 LAB — URINALYSIS WITH CULTURE REFLEX
BILIRUBIN UA: NEGATIVE
GLUCOSE UA: NEGATIVE
KETONES UA: NEGATIVE
NITRITE UA: NEGATIVE
PH UA: 7 (ref 5.0–9.0)
PROTEIN UA: 100 — AB
RBC UA: >100 /HPF — ABNORMAL HIGH (ref <4)
SPECIFIC GRAVITY UA: 1.015 (ref 1.005–1.040)
SQUAMOUS EPITHELIAL: <1 /HPF (ref 0–5)
UROBILINOGEN UA: 0.2
WBC UA: 7 /HPF — ABNORMAL HIGH (ref 0–5)

## 2021-02-27 LAB — CBC W/ AUTO DIFF
BASOPHILS ABSOLUTE COUNT: 0.1 10*9/L (ref 0.0–0.1)
BASOPHILS RELATIVE PERCENT: 0.7 %
EOSINOPHILS ABSOLUTE COUNT: 0 10*9/L (ref 0.0–0.5)
EOSINOPHILS RELATIVE PERCENT: 0.4 %
HEMATOCRIT: 35.5 % (ref 34.0–44.0)
HEMOGLOBIN: 11.1 g/dL — ABNORMAL LOW (ref 11.3–14.9)
LYMPHOCYTES ABSOLUTE COUNT: 1.3 10*9/L (ref 1.1–3.6)
LYMPHOCYTES RELATIVE PERCENT: 11 %
MEAN CORPUSCULAR HEMOGLOBIN CONC: 31.4 g/dL — ABNORMAL LOW (ref 32.0–36.0)
MEAN CORPUSCULAR HEMOGLOBIN: 25.8 pg — ABNORMAL LOW (ref 25.9–32.4)
MEAN CORPUSCULAR VOLUME: 82.3 fL (ref 77.6–95.7)
MEAN PLATELET VOLUME: 8.9 fL (ref 6.8–10.7)
MONOCYTES ABSOLUTE COUNT: 1.3 10*9/L — ABNORMAL HIGH (ref 0.3–0.8)
MONOCYTES RELATIVE PERCENT: 10.8 %
NEUTROPHILS ABSOLUTE COUNT: 9.2 10*9/L — ABNORMAL HIGH (ref 1.8–7.8)
NEUTROPHILS RELATIVE PERCENT: 77.1 %
NUCLEATED RED BLOOD CELLS: 0 /100{WBCs} (ref <=4)
PLATELET COUNT: 154 10*9/L (ref 150–450)
RED BLOOD CELL COUNT: 4.32 10*12/L (ref 3.95–5.13)
RED CELL DISTRIBUTION WIDTH: 25 % — ABNORMAL HIGH (ref 12.2–15.2)
WBC ADJUSTED: 12 10*9/L — ABNORMAL HIGH (ref 3.6–11.2)

## 2021-02-27 LAB — BASIC METABOLIC PANEL
ANION GAP: 5 mmol/L (ref 5–14)
BLOOD UREA NITROGEN: 28 mg/dL — ABNORMAL HIGH (ref 9–23)
BUN / CREAT RATIO: 16
CALCIUM: 9.3 mg/dL (ref 8.7–10.4)
CHLORIDE: 102 mmol/L (ref 98–107)
CO2: 28.7 mmol/L (ref 20.0–31.0)
CREATININE: 1.75 mg/dL — ABNORMAL HIGH
EGFR CKD-EPI AA FEMALE: 31 mL/min/{1.73_m2} — ABNORMAL LOW (ref >=60)
EGFR CKD-EPI NON-AA FEMALE: 27 mL/min/{1.73_m2} — ABNORMAL LOW (ref >=60)
GLUCOSE RANDOM: 141 mg/dL (ref 70–179)
POTASSIUM: 3.8 mmol/L (ref 3.4–4.8)
SODIUM: 136 mmol/L (ref 135–145)

## 2021-02-27 LAB — LACTATE, VENOUS, WHOLE BLOOD: LACTATE BLOOD VENOUS: 1.3 mmol/L (ref 0.5–1.8)

## 2021-02-27 MED ADMIN — oxyCODONE (OXYCONTIN) 12 hr crush resistant ER/CR tablet 10 mg: 10 mg | ORAL | @ 03:00:00 | Stop: 2021-02-26

## 2021-02-27 MED ADMIN — gabapentin (NEURONTIN) capsule 800 mg: 800 mg | ORAL | @ 03:00:00 | Stop: 2021-02-26

## 2021-02-27 MED ADMIN — sodium chloride 0.9% (NS) bolus 1,000 mL: 1000 mL | INTRAVENOUS | @ 03:00:00 | Stop: 2021-02-26

## 2021-02-27 MED ADMIN — sulfamethoxazole-trimethoprim (BACTRIM DS) 800-160 mg tablet 160 mg of trimethoprim: 1 | ORAL | @ 03:00:00 | Stop: 2021-02-26

## 2021-02-27 MED ADMIN — sulfamethoxazole-trimethoprim (BACTRIM DS) 800-160 mg tablet 160 mg of trimethoprim: 1 | ORAL | @ 21:00:00 | Stop: 2021-02-27

## 2021-02-27 NOTE — Unmapped (Signed)
Pt stating she is coming in for low grade fever, fatigue, and chills. Pt's sx started this morning. Pt has been treated this month for UTI and has finished her antibiotics. +DTS

## 2021-02-27 NOTE — Unmapped (Signed)
ISAR Screening- for all patients 65+ community-dwelling patients    1. Before the illness or injury that brought you to the Emergency Department, did you need someone to help you on a regular basis?NO  2. Since the illness or injury that brought you to the Emergency Department, have you needed more help than usual to take care of yourself?NO  3. Have you been hospitalized for one or more nights during the past six months (excluding a stay in the Emergency Department)?YES  4. In general, do you see well?YES  5. In general, do you have serious problems with your memory?NO  6. Do you take more than five different prescribed medications every day?YES    A score of 3 or more will require a Case Management consultation. Primary RN notified of score of 3 or more and CM paged for assessment.

## 2021-02-27 NOTE — Unmapped (Signed)
[facility]  Emergency Department Provider Note          ED Clinical Impression      Final diagnoses:   Urinary tract infection with hematuria, site unspecified (Primary)           Impression, ED Course, Assessment and Plan      2:14 PM    Impression: Urinary tract infection.  Now with gross hematuria.    Patient has under 24 hours of treatment for urinary tract infection.  She still has some generalized fatigue, subjective fevers, and hematuria.  On exam she looks well overall.  Her vital signs are reassuring.  We will check basic laboratory studies.  Anticipate discharge home.    4:03 PM  Urinalysis does show some hematuria although the rest of the study appears to be improving with no nitrites.  White blood cell count is improving.  Lactate is negative.  Renal function is slightly worse but no significant conical change.  Recommend pushing oral fluids.  I did review patient's recent CT scan from earlier this year and she had no signs of renal stones.  We will stay the current course with Bactrim.  We will give a dose here.  Recommend close follow-up with PCP.        Additional Medical Decision Making and Disclaimers     Barbara Huber was evaluated in Emergency Department at the time of this visit for the symptoms described in the history of present illness. She was evaluated in the context of the global COVID-19 pandemic, which necessitated consideration that the patient might be at risk for infection with the SARS-CoV-2 virus that causes COVID-19. Institutional protocols and algorithms that pertain to the evaluation of patients at risk for COVID-19 were followed during the patient's care in the ED.    I have reviewed the vital signs and the nursing notes. Labs and radiology results that were available during my care of the patient were independently reviewed by me and considered in my medical decision making.     I reviewed the patient's prior medical records    Portions of this record have been created using Dragon dictation software. Dictation errors have been sought, but may not have been identified and corrected.  ____________________________________________         History        Chief Complaint  Possible UTI      HPI   Barbara Huber is a 81 y.o. female presents with concern for gross hematuria this morning.  She has had 1 to 2 days of urinary frequency, general fatigue, nausea, and subjective fevers.  She was seen in emergency department yesterday and diagnosed with a urinary tract infection.  She was started on antibiotics.  She presents today because of hematuria.        Past Medical History:   Diagnosis Date   ??? Acute on chronic diastolic (congestive) heart failure (CMS-HCC) 08/23/2020   ??? Arthritis    ??? Calculus of kidney    ??? Calculus of ureter    ??? CHF (congestive heart failure) (CMS-HCC)    ??? COPD (chronic obstructive pulmonary disease) (CMS-HCC)    ??? Diabetes (CMS-HCC)    ??? Gangrenous cholecystitis 10/11/2020   ??? GERD (gastroesophageal reflux disease)    ??? Hydronephrosis    ??? Hypertension    ??? Intermediate coronary syndrome (CMS-HCC) 03/13/2014   ??? Lumbar stenosis    ??? Microscopic hematuria    ??? Nausea alone    ??? Neuropathy    ???  Nocturia    ??? Other chronic cystitis    ??? Pulmonary hypertension (CMS-HCC)    ??? Renal colic    ??? Sleep apnea    ??? Unstable angina pectoris (CMS-HCC) 03/13/2014       Patient Active Problem List   Diagnosis   ??? Spinal stenosis of lumbar region   ??? Thoracic or lumbosacral neuritis or radiculitis   ??? Lumbosacral spondylosis   ??? COPD (chronic obstructive pulmonary disease) (CMS-HCC)   ??? Sleep apnea in adult   ??? Essential hypertension   ??? Type 2 diabetes mellitus with stage 3b chronic kidney disease, without long-term current use of insulin (CMS-HCC)   ??? Nephrolithiasis   ??? Chronic cystitis   ??? Incomplete bladder emptying   ??? Nocturia   ??? Chronic atrial fibrillation (CMS-HCC)   ??? Peripheral neuropathy   ??? Chronic respiratory failure with hypoxia (CMS-HCC)   ??? Anemia   ??? Persistent atrial fibrillation (CMS-HCC)   ??? Anemia in chronic kidney disease   ??? Chronic kidney disease, stage III (moderate) (CMS-HCC)   ??? Enrolled in chronic care management   ??? Pulmonary hypertension (CMS-HCC)   ??? Shortness of breath   ??? Chronic prescription opiate use   ??? Dyspnea on exertion   ??? Lower extremity edema   ??? Hyponatremia   ??? Acute kidney injury superimposed on chronic kidney disease (CMS-HCC)   ??? 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 Cardiac amyloidosis (CMS-HCC)   ??? Complicated UTI (urinary tract infection)       Past Surgical History:   Procedure Laterality Date   ??? BACK SURGERY  1995   ??? CARPAL TUNNEL RELEASE Left 2014   ??? HYSTERECTOMY  1971   ??? IR INSERT CHOLECYSTOSMY TUBE PERCUTANEOUS  10/02/2020    IR INSERT CHOLECYSTOSMY TUBE PERCUTANEOUS 10/02/2020 Braulio Conte, MD IMG [facility]IR [facility]   ??? LUMBAR DISC SURGERY     ??? PR REMOVAL GALLBLADDER N/A 10/06/2020    Procedure: CHOLECYSTECTOMY;  Surgeon: Katherina Mires, MD;  Location: MAIN OR [facility];  Service: Trauma   ??? PR RIGHT HEART CATH O2 SATURATION & CARDIAC OUTPUT N/A 09/30/2020    Procedure: Right Heart Catheterization;  Surgeon: Neal Dy, MD;  Location: [facility] CATH;  Service: Cardiology   ??? PR RIGHT HEART CATH O2 SATURATION & CARDIAC OUTPUT N/A 01/09/2021    Procedure: Right Heart Catheterization;  Surgeon: Lesle Reek, MD;  Location: [facility] CATH;  Service: Cardiology       No current facility-administered medications for this encounter.    Current Outpatient Medications:   ???  ACCU-CHEK AVIVA PLUS TEST STRP Strp, USE TO CHECK BLOOD SUGAR 3 TIMES A DAY BEFORE MEALS, Disp: , Rfl:   ???  acetaminophen (TYLENOL) 500 MG tablet, Take 1,000 mg by mouth daily as needed for pain. , Disp: , Rfl:   ???  albuterol HFA 90 mcg/actuation inhaler, Inhale 2 puffs every eight (8) hours as needed for wheezing., Disp: , Rfl:   ???  amiodarone (PACERONE) 200 MG tablet, Take 1 tablet (200 mg total) by mouth daily., Disp: 30 tablet, Rfl: 11  ???  apixaban (ELIQUIS) 5 mg Tab, Take 1 tablet (5 mg total) by mouth Two (2) times a day., Disp: 60 tablet, Rfl: 11  ???  fluticasone propion-salmeteroL (ADVAIR HFA) 115-21 mcg/actuation inhaler, Inhale 2 puffs Two (2) times a day., Disp: 12 g, Rfl: 11  ???  gabapentin (NEURONTIN) 800 MG tablet, Take 1 tablet (800 mg  total) by mouth Three (3) times a day., Disp: 270 tablet, Rfl: 3  ???  lancets Misc, 1 each by Miscellaneous route daily. Accu Check, Disp: 100 each, Rfl: 6  ???  metFORMIN (GLUCOPHAGE) 500 MG tablet, Take 1 tablet (500 mg total) by mouth 2 (two) times a day with meals., Disp: 180 tablet, Rfl: 3  ???  montelukast (SINGULAIR) 10 mg tablet, Take 1 tablet (10 mg total) by mouth daily., Disp: 90 tablet, Rfl: 3  ???  NARCAN 4 mg/actuation nasal spray, 1 spray into alternating nostrils once as needed (opioid overdose).  (Patient not taking: Reported on 02/04/2021), Disp: , Rfl:   ???  oxyCODONE (OXYCONTIN) 10 mg TR12 12 hr crush resistant ER/CR tablet, Take 10 mg by mouth every twelve (12) hours., Disp: , Rfl:   ???  OXYGEN-AIR DELIVERY SYSTEMS MISC, 5 L by Miscellaneous route., Disp: , Rfl:   ???  rosuvastatin (CRESTOR) 5 MG tablet, Take 1 tablet (5 mg total) by mouth every other day., Disp: 3015 tablet, Rfl: 11  ???  spironolactone (ALDACTONE) 50 MG tablet, Take 1 tablet (50 mg total) by mouth daily., Disp: 90 tablet, Rfl: 3  ???  sulfamethoxazole-trimethoprim (BACTRIM DS) 800-160 mg per tablet, Take 1 tablet (160 mg of trimethoprim total) by mouth Two (2) times a day for 7 days., Disp: 14 tablet, Rfl: 0  ???  tafamidis 61 mg cap, Take 1 capsule (61 mg) by mouth daily., Disp: 30 capsule, Rfl: 11  ???  tiotropium bromide (SPIRIVA RESPIMAT) 2.5 mcg/actuation inhalation mist, Inhale 2 puffs daily., Disp: 4 g, Rfl: 11  ???  torsemide (DEMADEX) 20 MG tablet, Take 2 tablets (40 mg total) by mouth daily. May also take 2 tablets (40 mg total) daily as needed (weight gain)., Disp: 60 tablet, Rfl: 11    Allergies  Nitrofurantoin and Lipitor [atorvastatin]    Family History   Problem Relation Age of Onset   ??? Hypertension Mother    ??? Cancer Father         COLON CANCER   ??? Anesthesia problems Neg Hx    ??? Broken bones Neg Hx    ??? Clotting disorder Neg Hx    ??? Collagen disease Neg Hx    ??? Diabetes Neg Hx    ??? Dislocations Neg Hx    ??? Fibromyalgia Neg Hx    ??? Gout Neg Hx    ??? Hemophilia Neg Hx    ??? Osteoporosis Neg Hx    ??? Rheumatologic disease Neg Hx    ??? Scoliosis Neg Hx    ??? Severe sprains Neg Hx    ??? Sickle cell anemia Neg Hx    ??? Spinal Compression Fracture Neg Hx    ??? GU problems Neg Hx    ??? Kidney cancer Neg Hx    ??? Prostate cancer Neg Hx        Social History  Social History     Tobacco Use   ??? Smoking status: Former Smoker     Packs/day: 0.33     Types: Cigarettes   ??? Smokeless tobacco: Never Used   ??? Tobacco comment: Quit a few years ago   Vaping Use   ??? Vaping Use: Never used   Substance Use Topics   ??? Alcohol use: No   ??? Drug use: No       Review of Systems    Constitutional: See HPI.  Gastrointestinal: Positive for nausea.  Negative for abdominal pain, vomiting or diarrhea.  Genitourinary: See  HPI.  HPI.  10 point review of systems otherwise negative.     Physical Exam     This provider entered the patient's room: Yes:    ??? If this provider did not enter the room, a comprehensive physical exam was not able to be performed due to increased infection risk to themselves, other providers, staff and other patients), as well as to conserve personal protective equipment (PPE) utilization during the COVID-19 pandemic.    ??? If this provider did enter the patient room, the following was PPE worn: Surgical mask, eye protection and gloves      ED Triage Vitals [02/27/21 1011]   Enc Vitals Group      BP 98/55      Heart Rate 78      SpO2 Pulse       Resp 20      Temp 37.6 ??C (99.7 ??F)      Temp Source Oral      SpO2 94 %         Constitutional: Alert and oriented. Well appearing and in no distress.  Eyes: Conjunctivae are normal.  ENT       Head: Normocephalic and atraumatic.       Nose: No rhinorrhea.       Mouth/Throat: Mucous membranes appear moist.       Neck: No audible stridor.  Hematological/Lymphatic/Immunilogical: No cervical lymphadenopathy.  Cardiovascular: Blood pressure and pulse rate as documented above. Normal rate, regular rhythm. Normal and symmetric distal pulses are present in all extremities.  Respiratory: Breath sounds are normal. Normal respiratory pattern and effort. No audible wheezing or other abnormal breath sounds. Speaking easily in full sentences. No cough noted during my evaluation of the patient.   Gastrointestinal: Soft and nontender. There is no CVA tenderness.  Musculoskeletal: Normal range of motion in all extremities.       Right lower leg: No tenderness or edema.       Left lower leg: No tenderness or edema.  Neurologic: Normal speech and language. No gross focal neurologic deficits are appreciated.  Skin: Skin appears warm and dry. No rash visible.  Psychiatric: Mood and affect are normal. Speech and behavior are normal.         Nicholes Calamity, FNP  02/27/21 [phone]

## 2021-02-27 NOTE — Unmapped (Signed)
Pt was dx last night with UTI yesterday and had one oral dose of antibiotic. Pt has not started her prescribed antibiotic after discharge. Pt stating she is still feeling fatigued and nauseated. Today pt had noticed blood in her urine and this made her decide to come into be re-evaluated. +DTS

## 2021-02-27 NOTE — Unmapped (Signed)
ISAR Screening- for all patients 65+ community-dwelling patients    1. Before the illness or injury that brought you to the Emergency Department, did you need someone to help you on a regular basis? No  2. Since the illness or injury that brought you to the Emergency Department, have you needed more help than usual to take care of yourself? No  3. Have you been hospitalized for one or more nights during the past six months (excluding a stay in the Emergency Department)? No  4. In general, do you see well? Yes  5. In general, do you have serious problems with your memory? No  6. Do you take more than five different prescribed medications every day? Yes    A score of 3 or more will require a Case Management consultation. Primary RN notified of score of 3 or more and CM paged for assessment.      Total Score : 1

## 2021-02-27 NOTE — Unmapped (Signed)
[facility]  Emergency Department Provider Note        ED Clinical Impression      Final diagnoses:   Acute cystitis without hematuria (Primary)           Impression, ED Course, Assessment and Plan      Impression: Patient is a 80 y.o. 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     On exam, the patient appears well and is in NAD. Vital signs are within normal limits. Cardiopulmonary edam normal. Abdomen soft, non tender. No CVA tenderness.    Labs done in triage show mild leukocytosis with a left shift, UA with findings consistent with UTI.  Covid swab was negative.  Electrolytes are reassuring, creatinine of 1.5 appears to be near baseline.  Advised patient of findings and patient states that this is typically how she feels the urinary tract infection.  Most recent sensitivities reviewed, last urine culture grew out E. coli that was sensitive to Bactrim, Macrobid and Omnicef.  Patient was recently treated with [facility] and is allergic to Macrobid so we will treat with Bactrim.  Will p.o. trial given IV fluids.    11:18 PM  Upon reasessment, the patient feels better after IVF. She was able to tolerate a Filet-O-Fish sandwich. Will discharge with bactrim and recommended PCP follow up to discuss possibility of daily antibiotic for recurrent UTIs. Return precautions discussed, patient expresses understanding and agreement to plan. Denies any questions or concerns at this time. NAD noted upon discharge.     Additional Medical Decision Making     I have reviewed the vital signs and the nursing notes. Labs and radiology results that were available during my care of the patient were independently reviewed by me and considered in my medical decision making.     I reviewed the patient's prior medical records (Discharge Summary 01/24/21, recent ED notes).   I obtained additional history from daughter at bedside.   Portions of this record have been created using Scientist, clinical (histocompatibility and immunogenetics). Dictation errors have been sought, but may not have been identified and corrected.  ____________________________________________       History        Chief Complaint  Fatigue      HPI   Barbara Huber is a 80 y.o. female with PMH of A-fib with RVR (on Eliquis), [name], COPD on 3L at baseline, OSA, HTN, T2DM (on Metformin only), and [name]3 presenting today for fatigue. Patient has felt queasy all week. This morning she developed a low grade fever (Tmax 100F) and chills. She states that this feels similar to previous UTIs. No abdominal pain, dysuria, chest pain, shortness of breath.     Per chart review, patient was hospitalized at [facility] approximately 1.5 months ago (01/21/21-01/24/21) for complicated UTI. Labs notable for leukocytosis to 20.2 and U/A ??with + LE and >100 WBCs, urine culture has since grown >??100K E coli, >100k  Aeroccocus, and >100k enteroccocus faecalis. She ultimately received 4 days of ertapenem (based on previous sensitives), and was subsequently transitioned to amoxicillin 1000 mg twice daily for 5 days. Given that she significantly improved with ertapenem, which typically does not cover Enterococcus, etiology of her sepsis was presumed to be UTI 2/2 to E. coli or Aerooccocus. Patient  was taken off Jardiance due to second UTI since starting the medication. Additionally, patient was seen in this ED approximately 1 month ago (02/06/21) for acute cystitis with hematuria. She was discharged on Omnicef.     Past Medical History:   Diagnosis Date   ??? Acute on chronic diastolic (congestive) heart failure (CMS-HCC) 08/23/2020   ??? Arthritis    ??? Calculus of kidney    ??? Calculus of ureter    ??? CHF (congestive heart failure) (CMS-HCC)    ??? COPD (chronic obstructive pulmonary disease) (CMS-HCC)    ??? Diabetes (CMS-HCC) ??? Gangrenous cholecystitis 10/11/2020   ??? GERD (gastroesophageal reflux disease)    ??? Hydronephrosis    ??? Hypertension    ??? Intermediate coronary syndrome (CMS-HCC) 03/13/2014   ??? Lumbar stenosis    ??? Microscopic hematuria    ??? Nausea alone    ??? Neuropathy    ??? Nocturia    ??? Other chronic cystitis    ??? Pulmonary hypertension (CMS-HCC)    ??? Renal colic    ??? Sleep apnea    ??? Unstable angina pectoris (CMS-HCC) 03/13/2014       Patient Active Problem List   Diagnosis   ??? Spinal stenosis of lumbar region   ??? Thoracic or lumbosacral neuritis or radiculitis   ??? Lumbosacral spondylosis   ??? COPD (chronic obstructive pulmonary disease) (CMS-HCC)   ??? Sleep apnea in adult   ??? Essential hypertension   ??? Type 2 diabetes mellitus with stage 3b chronic kidney disease, without long-term current use of insulin (CMS-HCC)   ??? Nephrolithiasis   ??? Chronic cystitis   ??? Incomplete bladder emptying   ??? Nocturia   ??? Chronic atrial fibrillation (CMS-HCC)   ??? Peripheral neuropathy   ??? Chronic respiratory failure with hypoxia (CMS-HCC)   ??? Anemia   ??? Persistent atrial fibrillation (CMS-HCC)   ??? Anemia in chronic kidney disease   ??? Chronic kidney disease, stage III (moderate) (CMS-HCC)   ??? Enrolled in chronic care management   ??? Pulmonary hypertension (CMS-HCC)   ??? Shortness of breath   ??? Chronic prescription opiate use   ??? Dyspnea on exertion   ??? Lower extremity edema   ??? Hyponatremia   ??? Acute kidney injury superimposed on chronic kidney disease (CMS-HCC)   ??? 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 Cardiac amyloidosis (CMS-HCC)   ??? Complicated UTI (urinary tract infection)       Past Surgical History:   Procedure Laterality Date   ??? BACK SURGERY  1995   ??? CARPAL TUNNEL RELEASE Left 2014   ??? HYSTERECTOMY  1971   ??? IR INSERT CHOLECYSTOSMY TUBE PERCUTANEOUS  10/02/2020    IR INSERT CHOLECYSTOSMY TUBE PERCUTANEOUS 10/02/2020 Braulio Conte, MD IMG VIR [facility]   ??? LUMBAR DISC SURGERY     ??? PR REMOVAL GALLBLADDER N/A 10/06/2020    Procedure: CHOLECYSTECTOMY;  Surgeon: Katherina Mires, MD;  Location: MAIN OR [facility];  Service: Trauma   ??? PR RIGHT HEART CATH O2 SATURATION & CARDIAC OUTPUT N/A 09/30/2020    Procedure: Right Heart Catheterization;  Surgeon: Neal Dy, MD;  Location: [facility] CATH;  Service: Cardiology   ??? PR RIGHT HEART CATH O2 SATURATION & CARDIAC OUTPUT N/A 01/09/2021    Procedure: Right Heart Catheterization;  Surgeon: Lesle Reek, MD;  Location: [facility] CATH;  Service: Cardiology         Current Facility-Administered Medications:   ???  gabapentin (NEURONTIN) capsule  800 mg, 800 mg, Oral, Once, Sherryl Barters, MD  ???  oxyCODONE (OXYCONTIN) 12 hr crush resistant ER/CR tablet 10 mg, 10 mg, Oral, Once, Sherryl Barters, MD  ???  sodium chloride 0.9% (NS) bolus 1,000 mL, 1,000 mL, Intravenous, Once, Sherryl Barters, MD, Last Rate: 1,000 mL/hr at 02/26/21 2148, 1,000 mL at 02/26/21 2148    Current Outpatient Medications:   ???  ACCU-CHEK AVIVA PLUS TEST STRP Strp, USE TO CHECK BLOOD SUGAR 3 TIMES A DAY BEFORE MEALS, Disp: , Rfl:   ???  acetaminophen (TYLENOL) 500 MG tablet, Take 1,000 mg by mouth daily as needed for pain. , Disp: , Rfl:   ???  albuterol HFA 90 mcg/actuation inhaler, Inhale 2 puffs every eight (8) hours as needed for wheezing., Disp: , Rfl:   ???  amiodarone (PACERONE) 200 MG tablet, Take 1 tablet (200 mg total) by mouth daily., Disp: 30 tablet, Rfl: 11  ???  apixaban (ELIQUIS) 5 mg Tab, Take 1 tablet (5 mg total) by mouth Two (2) times a day., Disp: 60 tablet, Rfl: 11  ???  fluticasone propion-salmeteroL (ADVAIR HFA) 115-21 mcg/actuation inhaler, Inhale 2 puffs Two (2) times a day., Disp: 12 g, Rfl: 11  ???  gabapentin (NEURONTIN) 800 MG tablet, Take 1 tablet (800 mg total) by mouth Three (3) times a day., Disp: 270 tablet, Rfl: 3  ???  lancets Misc, 1 each by Miscellaneous route daily. Accu Check, Disp: 100 each, Rfl: 6  ???  metFORMIN (GLUCOPHAGE) 500 MG tablet, Take 1 tablet (500 mg total) by mouth 2 (two) times a day with meals., Disp: 180 tablet, Rfl: 3  ???  montelukast (SINGULAIR) 10 mg tablet, Take 1 tablet (10 mg total) by mouth daily., Disp: 90 tablet, Rfl: 3  ???  NARCAN 4 mg/actuation nasal spray, 1 spray into alternating nostrils once as needed (opioid overdose).  (Patient not taking: Reported on 02/04/2021), Disp: , Rfl:   ???  oxyCODONE (OXYCONTIN) 10 mg TR12 12 hr crush resistant ER/CR tablet, Take 10 mg by mouth every twelve (12) hours., Disp: , Rfl:   ???  OXYGEN-AIR DELIVERY SYSTEMS MISC, 5 L by Miscellaneous route., Disp: , Rfl:   ???  rosuvastatin (CRESTOR) 5 MG tablet, Take 1 tablet (5 mg total) by mouth every other day., Disp: 3015 tablet, Rfl: 11  ???  spironolactone (ALDACTONE) 50 MG tablet, Take 1 tablet (50 mg total) by mouth daily., Disp: 90 tablet, Rfl: 3  ???  tafamidis 61 mg cap, Take 1 capsule (61 mg) by mouth daily., Disp: 30 capsule, Rfl: 11  ???  tiotropium bromide (SPIRIVA RESPIMAT) 2.5 mcg/actuation inhalation mist, Inhale 2 puffs daily., Disp: 4 g, Rfl: 11  ???  torsemide (DEMADEX) 20 MG tablet, Take 2 tablets (40 mg total) by mouth daily. May also take 2 tablets (40 mg total) daily as needed (weight gain)., Disp: 60 tablet, Rfl: 11    Allergies  Nitrofurantoin and Lipitor [atorvastatin]    Family History   Problem Relation Age of Onset   ??? Hypertension Mother    ??? Cancer Father         COLON CANCER   ??? Anesthesia problems Neg Hx    ??? Broken bones Neg Hx    ??? Clotting disorder Neg Hx    ??? Collagen disease Neg Hx    ??? Diabetes Neg Hx    ??? Dislocations Neg Hx    ??? Fibromyalgia Neg Hx    ??? Gout Neg Hx    ???  Hemophilia Neg Hx    ??? Osteoporosis Neg Hx    ??? Rheumatologic disease Neg Hx    ??? Scoliosis Neg Hx    ??? Severe sprains Neg Hx    ??? Sickle cell anemia Neg Hx    ??? Spinal Compression Fracture Neg Hx    ??? GU problems Neg Hx    ??? Kidney cancer Neg Hx    ??? Prostate cancer Neg Hx        Social History  Social History     Tobacco Use   ??? Smoking status: Former Smoker     Packs/day: 0.33     Types: Cigarettes   ??? Smokeless tobacco: Never Used   ??? Tobacco comment: Quit a few years ago   Vaping Use   ??? Vaping Use: Never used   Substance Use Topics   ??? Alcohol use: No   ??? Drug use: No       Review of Systems  Constitutional: Positive for fever, chills.   Eyes: Negative for visual changes.  ENT: Negative for sore throat.  Cardiovascular: Negative for chest pain.  Respiratory: Negative for shortness of breath.  Gastrointestinal: Negative for abdominal pain, vomiting or diarrhea. Positive for nausea.   Genitourinary: Negative for dysuria.   Musculoskeletal: Negative for back pain.  Skin: Negative for rash.  Neurological: Negative for headaches, focal weakness or numbness.     Physical Exam     This provider entered the patient's room: Yes:    ??? If this provider did not enter the room, a comprehensive physical exam was not able to be performed due to increased infection risk to themselves, other providers, staff and other patients), as well as to conserve personal protective equipment (PPE) utilization during the COVID-19 pandemic.    ??? If this provider did enter the patient room, the following was PPE worn: Surgical mask, eye protection and gloves    ED Triage Vitals   Enc Vitals Group      BP 02/26/21 1844 106/52      Heart Rate 02/26/21 1840 80      SpO2 Pulse --       Resp 02/26/21 1845 18      Temp 02/26/21 1840 37.1 ??C (98.8 ??F)      Temp Source 02/26/21 1840 Oral      SpO2 02/26/21 1840 94 %      Weight 02/26/21 1840 96.6 kg (213 lb)      Height 02/26/21 1840 1.676 m (5' 6)       Constitutional: Alert and oriented. Well appearing and in no distress.  Eyes: Conjunctivae are normal.  ENT       Head: Normocephalic and atraumatic.       Nose: No congestion.       Mouth/Throat: Mucous membranes are moist.       Neck: No stridor.  Hematological/Lymphatic/Immunilogical: No cervical lymphadenopathy.  Cardiovascular: Normal rate, regular rhythm. Normal and symmetric distal pulses are present in all extremities.  Respiratory: Normal respiratory effort. Breath sounds are normal.  Gastrointestinal: Soft and nontender. There is no CVA tenderness.  Genitourinary: Deferred.   Musculoskeletal: Normal range of motion in all extremities.       Right lower leg: No tenderness or edema.       Left lower leg: No tenderness or edema.  Neurologic: Normal speech and language. No gross focal neurologic deficits are appreciated.  Skin: Skin is warm, dry and intact. No rash noted.  Psychiatric: Mood and affect  are normal. Speech and behavior are normal.    Documentation assistance was provided by Andrey Farmer, Scribe on February 26, 2021 at 8:57 PM for Shaune Leeks, MD    February 28, 2021 4:02 AM. Documentation assistance provided by the scribe. I was present during the time the encounter was recorded. The information recorded by the scribe was done at my direction and has been reviewed and validated by me.                  Sherryl Barters, MD  02/28/21 [phone]

## 2021-02-28 NOTE — Unmapped (Signed)
Urine Culture  Order: [id number] - Reflex for Order [id number]  Status: final result ??  ??  >100,000 CFU/mL Escherichia coli??Abnormal??   Presumptive Identification  ??  Specimen Source: Clean Catch  ??  sulfamethoxazole-trimethoprim (BACTRIM DS) 800-160 mg per tablet [7555]    With susceptibilities posted - tx appropriate

## 2021-02-28 NOTE — Unmapped (Signed)
Urine Culture  Order: [id number] - Reflex for Order [id number]  Status: Preliminary result ??    >100,000 CFU/mL Escherichia coli??Abnormal??   Presumptive Identification    Specimen Source: Clean Catch    sulfamethoxazole-trimethoprim (BACTRIM DS) 800-160 mg per tablet [7555]

## 2021-03-03 NOTE — Unmapped (Signed)
Urine Culture  Order: [id number] - Reflex for Order [id number]  Status: Final result ??    >100,000 CFU/mL Escherichia coli??Abnormal??   Susceptibility performed on Previous Isolate - 02/26/2021    Specimen Source: Clean Catch    sulfamethoxazole-trimethoprim (BACTRIM DS) 800-160 mg per tablet [7555]

## 2021-03-06 MED ORDER — ESTRADIOL 0.01% (0.1 MG/GRAM) VAGINAL CREAM
0 days
Start: 2021-03-06 — End: ?

## 2021-03-10 NOTE — Unmapped (Signed)
[place] [facility]-- Medication Adherence  NO OUTREACH    Barbara Huber is a 80 y.o. female identified as being at risk for adherence <80%  for the STATIN - Statin adherence quality measure(s), based on payer reports.       After chart and pharmacy claims review regarding the medication(s) below, patient outreach was not needed due to the following reason(s):     ??? Rosuvastatin (Crestor) 5mg  (Moderate Intensity):  : Recently filled at [facility] on 02/28/21 for 30 days supply      Mitzi Davenport , CPhT  Pharmacy Liaison  [place] [facility]

## 2021-03-17 DIAGNOSIS — N1832 Type 2 diabetes mellitus with stage 3b chronic kidney disease, without long-term current use of insulin (CMS-HCC): Principal | ICD-10-CM

## 2021-03-17 DIAGNOSIS — I10 Essential (primary) hypertension: Principal | ICD-10-CM

## 2021-03-17 DIAGNOSIS — E1122 Type 2 diabetes mellitus with diabetic chronic kidney disease: Principal | ICD-10-CM

## 2021-03-17 NOTE — Unmapped (Signed)
I was the supervising physician in the delivery of the service. Merve Hotard R Jonalyn Sedlak, MD

## 2021-03-17 NOTE — Unmapped (Signed)
CCM Follow-up Call    Patient recently seen on 02/26/2021 and 02/27/2021 for gross hematuria secondary to UTI.  Patient states no longer symptomatic.  Treatment completed without complication.      PCP Action:None     CCM Action: Follow up in a month.  Anemia and COPD management.     Patient Action :1. Keep upcoming appt. with Dr. Brooke Dare  2. Call CCM or [facility] for medical advice/need ( patient states they have the numbers for both).      Diabetes                      ?? The goal for your diabetes is to have an A1c <7.0%.  ?? Continue to take your diabetes medications as directed and contact me with any questions or concerns regarding the medications.  ?? Let me know if you are having either very low or very high blood sugars. My blood sugars have been running really good  This am result: 106.    ?? Decrease your intake of carbohydrate rich foods.  If you need help with this we can have you meet with a dietician to discuss in more detail.   ?? Perform physical activity 3 to 5 times per week for at least 15 minutes each time. Patient states she went to the grocery store this past Saturday for the first time in 2 years.  States it was crowded and she really over did it.  Reports fatigue and some mild shortness of breath but did walk the entire store with cart.   She was surprised to see how many people were out in public.  I let her know to take it easy on outings and practice the perimeter of stores until acclimates to being out in public more.    ?? It is important to look at your feet regularly to see if there is any sign of infection or injury.  My neuropathy keeps me from moving around for long periods of time.  Patients states she does ambulate around the house throughout the day and is attempting to get outside more on nice days.  Denies complications regarding neuropathy on a recent outing but did get fatigued.         High blood pressure                      ?? The goal for your blood pressure is <140/90 mmHg. Patient reports this mornings BP was 124/65 HR 72.  Keeping a BP log and has no complaints or reports of abnormal readings.    ?? Continue to take your high blood pressure medications as directed and contact me with any questions or concerns regarding the medications.  Patient reports compliance with medication regimen and is not in need of any refills at present.    ?? Eating a low salt diet (less than 3 grams of sodium per day) can help lower your blood pressure.  ?? Perform physical activity 3 to 5 times per week for at least 15 minutes each time. Patient is ambulating around the house periodically throughout each day.

## 2021-03-19 DIAGNOSIS — J302 Other seasonal allergic rhinitis: Principal | ICD-10-CM

## 2021-03-19 MED ORDER — MONTELUKAST 10 MG TABLET
ORAL_TABLET | Freq: Every day | ORAL | 3 refills | 90 days
Start: 2021-03-19 — End: ?

## 2021-03-19 NOTE — Unmapped (Signed)
Patient Requests Medication Refill    ??? Name of medication: montelukast (SINGULAIR) 10 mg tablet  ??? Is there a preferred amount requested (e.g. 30 pills, 3 months worth - if no leave blank):   ??? Desired pharmacy (defaults to patient's preferred pharmacy list - confirm or change):    [facility] - [place], [place] - [address]  [address]  [place] [address]  Phone: [phone] Fax: [phone]    ??? Best callback number if any questions (defaults to patient's preferred phone - confirm or change): [phone]  ??? PCP: Jacquiline Doe, MD  ??? Last encounter in department: 02/04/2021 (If more than a year, offer an appointment.)

## 2021-03-21 ENCOUNTER — Ambulatory Visit
Admit: 2021-03-21 | Discharge: 2021-03-22 | Payer: MEDICARE | Attending: Cardiovascular Disease | Primary: Cardiovascular Disease

## 2021-03-21 DIAGNOSIS — I43 Cardiomyopathy in diseases classified elsewhere: Principal | ICD-10-CM

## 2021-03-21 DIAGNOSIS — E1122 Type 2 diabetes mellitus with diabetic chronic kidney disease: Principal | ICD-10-CM

## 2021-03-21 DIAGNOSIS — N1832 Type 2 diabetes mellitus with stage 3b chronic kidney disease, without long-term current use of insulin (CMS-HCC): Principal | ICD-10-CM

## 2021-03-21 DIAGNOSIS — I5032 Chronic diastolic (congestive) heart failure: Principal | ICD-10-CM

## 2021-03-21 DIAGNOSIS — E854 Organ-limited amyloidosis: Principal | ICD-10-CM

## 2021-03-21 DIAGNOSIS — I4819 Other persistent atrial fibrillation: Principal | ICD-10-CM

## 2021-03-21 DIAGNOSIS — N1831 Stage 3a chronic kidney disease (CMS-HCC): Principal | ICD-10-CM

## 2021-03-21 LAB — CBC W/ AUTO DIFF
BASOPHILS ABSOLUTE COUNT: 0.1 10*9/L (ref 0.0–0.1)
BASOPHILS RELATIVE PERCENT: 1.1 %
EOSINOPHILS ABSOLUTE COUNT: 0.3 10*9/L (ref 0.0–0.5)
EOSINOPHILS RELATIVE PERCENT: 3 %
HEMATOCRIT: 30.7 % — ABNORMAL LOW (ref 34.0–44.0)
HEMOGLOBIN: 9.9 g/dL — ABNORMAL LOW (ref 11.3–14.9)
LYMPHOCYTES ABSOLUTE COUNT: 1.2 10*9/L (ref 1.1–3.6)
LYMPHOCYTES RELATIVE PERCENT: 13.3 %
MEAN CORPUSCULAR HEMOGLOBIN CONC: 32.1 g/dL (ref 32.0–36.0)
MEAN CORPUSCULAR HEMOGLOBIN: 26.9 pg (ref 25.9–32.4)
MEAN CORPUSCULAR VOLUME: 83.7 fL (ref 77.6–95.7)
MEAN PLATELET VOLUME: 8 fL (ref 6.8–10.7)
MONOCYTES ABSOLUTE COUNT: 0.7 10*9/L (ref 0.3–0.8)
MONOCYTES RELATIVE PERCENT: 7.8 %
NEUTROPHILS ABSOLUTE COUNT: 6.9 10*9/L (ref 1.8–7.8)
NEUTROPHILS RELATIVE PERCENT: 74.8 %
NUCLEATED RED BLOOD CELLS: 0 /100{WBCs} (ref <=4)
PLATELET COUNT: 318 10*9/L (ref 150–450)
RED BLOOD CELL COUNT: 3.67 10*12/L — ABNORMAL LOW (ref 3.95–5.13)
RED CELL DISTRIBUTION WIDTH: 21.9 % — ABNORMAL HIGH (ref 12.2–15.2)
WBC ADJUSTED: 9.2 10*9/L (ref 3.6–11.2)

## 2021-03-21 LAB — COMPREHENSIVE METABOLIC PANEL
ALBUMIN: 3.7 g/dL (ref 3.4–5.0)
ALKALINE PHOSPHATASE: 65 U/L (ref 46–116)
ALT (SGPT): 13 U/L (ref 10–49)
ANION GAP: 6 mmol/L (ref 5–14)
AST (SGOT): 23 U/L (ref <=34)
BILIRUBIN TOTAL: 0.4 mg/dL (ref 0.3–1.2)
BLOOD UREA NITROGEN: 37 mg/dL — ABNORMAL HIGH (ref 9–23)
BUN / CREAT RATIO: 23
CALCIUM: 9.3 mg/dL (ref 8.7–10.4)
CHLORIDE: 103 mmol/L (ref 98–107)
CO2: 29.8 mmol/L (ref 20.0–31.0)
CREATININE: 1.62 mg/dL — ABNORMAL HIGH
EGFR CKD-EPI AA FEMALE: 35 mL/min/{1.73_m2} — ABNORMAL LOW (ref >=60)
EGFR CKD-EPI NON-AA FEMALE: 30 mL/min/{1.73_m2} — ABNORMAL LOW (ref >=60)
GLUCOSE RANDOM: 171 mg/dL (ref 70–179)
POTASSIUM: 4.2 mmol/L (ref 3.4–4.8)
PROTEIN TOTAL: 7.5 g/dL (ref 5.7–8.2)
SODIUM: 139 mmol/L (ref 135–145)

## 2021-03-21 LAB — IRON PANEL
IRON SATURATION: 6 %
IRON: 18 ug/dL — ABNORMAL LOW
TOTAL IRON BINDING CAPACITY: 317 ug/dL (ref 250–425)

## 2021-03-21 LAB — SLIDE REVIEW

## 2021-03-21 LAB — FERRITIN: FERRITIN: 28.8 ng/mL

## 2021-03-21 NOTE — Unmapped (Signed)
Today,    MEDICATIONS:  NO medication changes today.    Call if you have questions about your medications.    LABS:  We will call you if your labs need attention.    NEXT APPOINTMENT:  Return to clinic in 3 months with me or Derwood Kaplan    In general, to take care of your heart failure:  -Limit your fluid intake to 2 Liters (half-gallon) per day.    -Limit your salt intake to ideally 2-3 grams (2000-3000 mg) per day.  -Weigh yourself daily and record, and bring that weight diary to your next appointment.  (Weight gain of 2-3 pounds in 1 day typically means fluid weight.)    The medications for your heart are to help your heart and help you live longer.    Please contact us before stopping any of your heart medications.    Call the clinic at [phone] with questions.  Our clinic fax number is [phone].  If you need to reschedule future appointments, please call [phone] or [phone]  My office number (c/o De Burrs RN) is [phone] if you need further assistance.  After office hours, if you have urgent questions/problems, contact the on-call cardiologist through the hospital operator: [phone].    To learn more about heart failure, please read [facility] Learning to Live with Heart Failure:  [email or web address].    Available online at   [email or web address] - then click the link Learning to Live with Heart Failure patient booklet    OR    [email or web address] - open the window for Medical Management and click the link Living with Heart Failure

## 2021-03-21 NOTE — Unmapped (Signed)
Barbara Huber    Referring Provider: Artelia Laroche, MD  [address]  [address]  [place],  [place] [address]   Primary Provider: Jacquiline Doe, MD  [address]  [place] [address]   Other Providers:  Dr Luretha Murphy    Reason for Visit:  Barbara Huber is a 80 y.o. female being seen for hospital follow up.    Assessment & Plan:  1. 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   - Moderately increased wall thickness with low voltage ECG - she's had issues with hypotension on ARB and BB, as well as atrial arrhythmias. SPEP/UPEP/free light chains without concern. 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 She has one daughter who accompanies her and is informed.   - DC'd Jardiance 10mg  daily due to recurrent UTIs  - Continue amiodarone 200 mg daily   - Continue torsemide 40 mg BID, with an extra 20-40mg  PRN for volume symptoms or weight gain  - Conitnue Tafamidis 61 mg daily - has been taking it for about a month now  - Continue spironolactone 50 mg daily    2. Persistent atrial fibrillation  - CHA2DS2-VASc score = 5 (1-gender, 2-Age, 1-DM, 1-HTN)  - Anticoagulated with Eliquis 5mg  BID  - She had been in afib since at least August; now s/p cardioversion 1/14 and continues to be in NSR since  - Continue amiodarone 200mg  daily    3. COPD  - home O2 3L [place] - followed by pulm  - on Advair and Spiriva    Follow-up:  No follow-ups on file.      History of Present Illness:  Barbara Huber is a 80 y.o. female with PMHx of COPD on 3L home [place], CHF, [name], [name], T2DM??who presents today for hospital follow-up. She presented to [facility]??with Acute on Chronic Decompensated Diastolic CHF in the setting of Septic Shock d/t Gangrenous Cholecystitis now s/p cholecystectomy 10/6. She presented with 1 week of dyspnea on exertion, leg swelling and abdominal distention without chest pain. Her dry weight was 232 pounds,??238 pounds. Upon arrival to [facility], she was in afib with RVR with higher rates and softer pressures requiring NE and addition of vaso to maintain MAP>65. She was given digoxin x 1 for better rate control with mild improvement in rates but was still requiring high doses of NE as well as vaso.??Dobutamine given to augment diuresis via bumex drip and metolazone x1 dose. Etiology of pressor requirement likely due to septic shock. Echo 10/4 showed upper-normal RV size with??reduced systolic function.??RHC to evaluate for RV dysfunction unable to be completed due to arterial placement of micropuncture sheath during procedure. She was weaned off of pressors and continued to improve with diuresis and treatment of underlying infection. At time of discharge, her acute Diastolic HF was controlled and she was restarted on home meds. For her afib, coreg was held during admission but then restarted prior to discharge.     She as seen in hospital follow-up 10/30/20. She was discharged on Lasix 40mg  BID. Her weight was 221lbs on discharge. Lasix was reduced to 20mg  daily by her PCP for low SBPs (although notes say 40mg  daily). Since reducing the Lasix, her weight crept up to 226lbs. Her belly felt  tight and she had LE  edema.  At visit with PCP, her Coreg was also changed to metoprolol 25mg  and irbesartan was stopped. BPs were running 110s/70-90s. HR running around 100-110. Her metoprolol was increased to 50mg  for rate control. Lasix was increased to 40mg  daily.    She was seen 11/18 and her weight was been running 224-227lbs. She has low energy and since increasing metoprolol she notes worsening of her symptoms. She feels bloated and has LE edema. She is not back to her usual activities, like fixing her food or cooking. Her BP is running 100-120s/70-80s.     PYP scan showed TTR amyloid. She then saw Dr Luretha Murphy 12/3 and was feeling ok but somewhat volume up.   We met her on 1/7. Metoprolol was decreased to 50mg  daily and she was started on amiodarone. She unfortunately was hospitalized 1/11-1/15 for 3 days of worsening shortness of breath and fatigue.  She was found to have AKI and oliguria in the setting of increased beta blocker as an outpatient resulting in decreased cardiac filling/output. She was treated with gentle diuresis and removal of beta blockade with subsequent improvement in blood pressure and renal function. She underwent RHC with showed elevated filling pressures, normal CO, and restrictive filling pattern. She had a DCCV 1/14 with resolution of atrial fibrillation. She was continued on amiodarone.    Interval History  Since she was last seen in clinic by Derwood Kaplan, she has been feeling pretty well. Her weight has been stable on 40 mg torsemide BID. She has not had any more UTIs and her DOE has been back to when she was feeling well. She has more energy and appetite is a bit better too. Her breathing and energy are good. Her HR has been very good and so has her BP.     Cardiovascular History & Procedures:    Cath / PCI:  ?? none    CV Surgery:  ??  none    EP Procedures and Devices:  ?? none    Non-Invasive Evaluation(s):  Echo:  ?? 09/30/20  Summary    1. Limited study to assess systolic function.    2. IVC size and inspiratory change suggest normal right atrial pressure.  (0-5 mmHg).    3. The left ventricle is normal in size with mildly to moderately increased  wall thickness.    4. The left ventricular systolic function is normal with no obvious wall  motion abnormalities, LVEF is visually estimated at > 55%.    5. Mitral annular calcification is present.    6. The left atrium is mildly dilated in size.    7. The right ventricle is upper normal in size, with reduced systolic  Function.    Cardiac CT/MRI/Nuclear Tests:  ??  None    6 Minute Walk:  ?? None    Cardiopulmonary Stress Tests:  ??  None               Other Past Medical History:  See below for the complete EPIC list of past medical and surgical history.      Allergies:  Nitrofurantoin and Lipitor [atorvastatin]    Current Medications:  Current Outpatient Medications   Medication Sig Dispense Refill   ??? ACCU-CHEK AVIVA PLUS TEST STRP Strp USE TO CHECK BLOOD SUGAR 3 TIMES A DAY BEFORE MEALS     ??? acetaminophen (TYLENOL) 500 MG tablet Take 1,000 mg by mouth daily as needed for pain.      ??? albuterol HFA 90 mcg/actuation inhaler Inhale  2 puffs every eight (8) hours as needed for wheezing.     ??? amiodarone (PACERONE) 200 MG tablet Take 1 tablet (200 mg total) by mouth daily. 30 tablet 11   ??? apixaban (ELIQUIS) 5 mg Tab Take 1 tablet (5 mg total) by mouth Two (2) times a day. 60 tablet 11   ??? fluticasone propion-salmeteroL (ADVAIR HFA) 115-21 mcg/actuation inhaler Inhale 2 puffs Two (2) times a day. 12 g 11   ??? gabapentin (NEURONTIN) 800 MG tablet Take 1 tablet (800 mg total) by mouth Three (3) times a day. 270 tablet 3   ??? lancets Misc 1 each by Miscellaneous route daily. Accu Check 100 each 6   ??? metFORMIN (GLUCOPHAGE) 500 MG tablet Take 1 tablet (500 mg total) by mouth 2 (two) times a day with meals. 180 tablet 3   ??? montelukast (SINGULAIR) 10 mg tablet Take 1 tablet (10 mg total) by mouth daily. 90 tablet 3   ??? oxyCODONE (OXYCONTIN) 10 mg TR12 12 hr crush resistant ER/CR tablet Take 10 mg by mouth every twelve (12) hours.     ??? OXYGEN-AIR DELIVERY SYSTEMS MISC 5 L by Miscellaneous route.     ??? rosuvastatin (CRESTOR) 5 MG tablet Take 1 tablet (5 mg total) by mouth every other day. 3015 tablet 11   ??? spironolactone (ALDACTONE) 50 MG tablet Take 1 tablet (50 mg total) by mouth daily. 90 tablet 3   ??? tafamidis 61 mg cap Take 1 capsule (61 mg) by mouth daily. 30 capsule 11   ??? tiotropium bromide (SPIRIVA RESPIMAT) 2.5 mcg/actuation inhalation mist Inhale 2 puffs daily. 4 g 11   ??? torsemide (DEMADEX) 20 MG tablet Take 2 tablets (40 mg total) by mouth daily. May also take 2 tablets (40 mg total) daily as needed (weight gain). 60 tablet 11   ??? estradioL (ESTRACE) 0.01 % (0.1 mg/gram) vaginal cream INSERT ONE GRAM VAGINALLY AT BEDTIME     ??? NARCAN 4 mg/actuation nasal spray 1 spray into alternating nostrils once as needed (opioid overdose).  (Patient not taking: Reported on 02/04/2021)       No current facility-administered medications for this visit.       Family History:  The patient's family history includes Cancer in her father; Hypertension in her mother.    Social history:  She  reports that she has quit smoking. Her smoking use included cigarettes. She smoked 0.33 packs per day. She has never used smokeless tobacco. She reports that she does not drink alcohol and does not use drugs.    Review of Systems:  As per HPI.  Rest of the review of ten systems is negative or unremarkable except as stated above.    Physical Exam:  VITAL SIGNS:   Vitals:    03/21/21 1050   BP: 119/57   Pulse: 75   SpO2: 98%      Wt Readings from Last 3 Encounters:   03/21/21 96.2 kg (212 lb)   02/26/21 96.6 kg (213 lb)   02/21/21 97.9 kg (215 lb 12.8 oz)      Today's Body mass index is 36.39 kg/m??.   Height: 162.6 cm (5' 4)  CONSTITUTIONAL: chronically ill-appearing in no acute distress  EYES: Conjunctivae and sclerae clear and anicteric.  ENT: Benign.   CARDIOVASCULAR: JVP not seen above the clavicle with HOB at 90 degrees. Rate and rhythm are regular.  There is no lifts or heaves.  Normal S1, S2. There is no murmur, gallops or rubs.  Radial and pedal pulses are 2+, bilaterally.   There is 1+ pitting pedal/pretibial edema, right greater than left.   RESPIRATORY: Normal respiratory effort. Clear to auscultation bilaterally..  There are no wheezes.  GASTROINTESTINAL: Soft, non-tender, with audible bowel sounds. Abdomen nondistended.  Liver is nonpalpable.  SKIN: No rashes, ecchymosis or petechiae.  Warm, well perfused.   MUSCULOSKELETAL:  no joint swelling   NEURO/PSYCH: Appropriate mood and affect. Alert and oriented to person, place, and time. No gross motor or sensory deficits evident.    Pertinent Laboratory Studies:   Admission on 02/27/2021, Discharged on 02/27/2021   Component Date Value Ref Range Status   ??? Color, UA 02/27/2021 Yellow   Final   ??? Clarity, UA 02/27/2021 Cloudy   Final   ??? Specific Gravity, UA 02/27/2021 1.015  1.005 - 1.040 Final   ??? pH, UA 02/27/2021 7.0  5.0 - 9.0 Final   ??? Leukocyte Esterase, UA 02/27/2021 Small (A) Negative Final   ??? Nitrite, UA 02/27/2021 Negative  Negative Final   ??? Protein, UA 02/27/2021 100 mg/dL (A) Negative Final   ??? Glucose, UA 02/27/2021 Negative  Negative Final   ??? Ketones, UA 02/27/2021 Negative  Negative Final   ??? Urobilinogen, UA 02/27/2021 0.2 mg/dL   Final   ??? Bilirubin, UA 02/27/2021 Negative  Negative Final   ??? Blood, UA 02/27/2021 Large (A) Negative Final   ??? RBC, UA 02/27/2021 >100 (A) <4 /HPF Final   ??? WBC, UA 02/27/2021 7 (A) 0 - 5 /HPF Final   ??? Squam Epithel, UA 02/27/2021 <1  0 - 5 /HPF Final   ??? Bacteria, UA 02/27/2021 Many (A) None Seen /HPF Final   ??? RBC Clumps 02/27/2021 Moderate (A) None Seen /HPF Final   ??? Sodium 02/27/2021 136  135 - 145 mmol/L Final   ??? Potassium 02/27/2021 3.8  3.4 - 4.8 mmol/L Final   ??? Chloride 02/27/2021 102  98 - 107 mmol/L Final   ??? CO2 02/27/2021 28.7  20.0 - 31.0 mmol/L Final   ??? Anion Gap 02/27/2021 5  5 - 14 mmol/L Final   ??? BUN 02/27/2021 28 (A) 9 - 23 mg/dL Final   ??? Creatinine 02/27/2021 1.75 (A) 0.60 - 0.80 mg/dL Final   ??? BUN/Creatinine Ratio 02/27/2021 16   Final   ??? EGFR [name]-EPI Non-African American,* 02/27/2021 27 (A) >=60 mL/min/1.64m2 Final   ??? EGFR [name]-EPI African American, Fem* 02/27/2021 31 (A) >=60 mL/min/1.71m2 Final   ??? Glucose 02/27/2021 141  70 - 179 mg/dL Final   ??? Calcium 16/09/9603 9.3  8.7 - 10.4 mg/dL Final   ??? Lactate, Venous 02/27/2021 1.3  0.5 - 1.8 mmol/L Final   ??? WBC 02/27/2021 12.0 (A) 3.6 - 11.2 10*9/L Final   ??? RBC 02/27/2021 4.32  3.95 - 5.13 10*12/L Final   ??? HGB 02/27/2021 11.1 (A) 11.3 - 14.9 g/dL Final   ??? HCT 54/08/8118 35.5  34.0 - 44.0 % Final   ??? MCV 02/27/2021 82.3  77.6 - 95.7 fL Final   ??? MCH 02/27/2021 25.8 (A) 25.9 - 32.4 pg Final   ??? MCHC 02/27/2021 31.4 (A) 32.0 - 36.0 g/dL Final   ??? RDW 14/78/2956 25.0 (A) 12.2 - 15.2 % Final   ??? MPV 02/27/2021 8.9  6.8 - 10.7 fL Final   ??? Platelet 02/27/2021 154  150 - 450 10*9/L Final   ??? nRBC 02/27/2021 0  <=4 /100 WBCs Final   ??? Neutrophils % 02/27/2021 77.1  % Final   ???  Lymphocytes % 02/27/2021 11.0  % Final   ??? Monocytes % 02/27/2021 10.8  % Final   ??? Eosinophils % 02/27/2021 0.4  % Final   ??? Basophils % 02/27/2021 0.7  % Final   ??? Absolute Neutrophils 02/27/2021 9.2 (A) 1.8 - 7.8 10*9/L Final   ??? Absolute Lymphocytes 02/27/2021 1.3  1.1 - 3.6 10*9/L Final   ??? Absolute Monocytes 02/27/2021 1.3 (A) 0.3 - 0.8 10*9/L Final   ??? Absolute Eosinophils 02/27/2021 0.0  0.0 - 0.5 10*9/L Final   ??? Absolute Basophils 02/27/2021 0.1  0.0 - 0.1 10*9/L Final   ??? Anisocytosis 02/27/2021 Marked (A) Not Present Final   ??? Urine Culture, Comprehensive 02/27/2021 >100,000 CFU/mL Escherichia coli (A)  Final   Admission on 02/26/2021, Discharged on 02/26/2021   Component Date Value Ref Range Status   ??? Sodium 02/26/2021 138  135 - 145 mmol/L Final   ??? Potassium 02/26/2021 4.1  3.4 - 4.8 mmol/L Final   ??? Chloride 02/26/2021 102  98 - 107 mmol/L Final   ??? Anion Gap 02/26/2021 8  5 - 14 mmol/L Final   ??? CO2 02/26/2021 28.3  20.0 - 31.0 mmol/L Final   ??? BUN 02/26/2021 28 (A) 9 - 23 mg/dL Final   ??? Creatinine 02/26/2021 1.57 (A) 0.60 - 0.80 mg/dL Final   ??? BUN/Creatinine Ratio 02/26/2021 18   Final   ??? EGFR [name]-EPI Non-African American,* 02/26/2021 31 (A) >=60 mL/min/1.62m2 Final   ??? EGFR [name]-EPI African American, Fem* 02/26/2021 36 (A) >=60 mL/min/1.67m2 Final   ??? Glucose 02/26/2021 118  70 - 179 mg/dL Final   ??? Calcium 16/09/9603 9.0  8.7 - 10.4 mg/dL Final   ??? Albumin 54/08/8118 3.7  3.4 - 5.0 g/dL Final   ??? Total Protein 02/26/2021 7.5  5.7 - 8.2 g/dL Final   ??? Total Bilirubin 02/26/2021 0.5 0.3 - 1.2 mg/dL Final   ??? AST 14/78/2956 17  <=34 U/L Final   ??? ALT 02/26/2021 9 (A) 10 - 49 U/L Final   ??? Alkaline Phosphatase 02/26/2021 73  46 - 116 U/L Final   ??? Lactate, Venous 02/26/2021 1.1  0.5 - 1.8 mmol/L Final   ??? Color, UA 02/26/2021 Yellow   Final   ??? Clarity, UA 02/26/2021 Hazy   Final   ??? Specific Gravity, UA 02/26/2021 1.015  1.005 - 1.040 Final   ??? pH, UA 02/26/2021 7.0  5.0 - 9.0 Final   ??? Leukocyte Esterase, UA 02/26/2021 Small (A) Negative Final   ??? Nitrite, UA 02/26/2021 Positive (A) Negative Final   ??? Protein, UA 02/26/2021 30 mg/dL (A) Negative Final   ??? Glucose, UA 02/26/2021 Negative  Negative Final   ??? Ketones, UA 02/26/2021 Negative  Negative Final   ??? Urobilinogen, UA 02/26/2021 0.2 mg/dL   Final   ??? Bilirubin, UA 02/26/2021 Negative  Negative Final   ??? Blood, UA 02/26/2021 Large (A) Negative Final   ??? RBC, UA 02/26/2021 >100 (A) <4 /HPF Final   ??? WBC, UA 02/26/2021 20 (A) 0 - 5 /HPF Final   ??? Squam Epithel, UA 02/26/2021 3  0 - 5 /HPF Final   ??? Bacteria, UA 02/26/2021 Many (A) None Seen /HPF Final   ??? WBC 02/26/2021 14.2 (A) 3.6 - 11.2 10*9/L Final   ??? RBC 02/26/2021 4.13  3.95 - 5.13 10*12/L Final   ??? HGB 02/26/2021 10.5 (A) 11.3 - 14.9 g/dL Final   ??? HCT 21/30/8657 33.4 (A) 34.0 - 44.0 % Final   ???  MCV 02/26/2021 80.9  77.6 - 95.7 fL Final   ??? MCH 02/26/2021 25.5 (A) 25.9 - 32.4 pg Final   ??? MCHC 02/26/2021 31.5 (A) 32.0 - 36.0 g/dL Final   ??? RDW 16/09/9603 25.9 (A) 12.2 - 15.2 % Final   ??? MPV 02/26/2021 9.3  6.8 - 10.7 fL Final   ??? Platelet 02/26/2021 177  150 - 450 10*9/L Final   ??? nRBC 02/26/2021 0  <=4 /100 WBCs Final   ??? Neutrophils % 02/26/2021 82.0  % Final   ??? Lymphocytes % 02/26/2021 7.6  % Final   ??? Monocytes % 02/26/2021 9.1  % Final   ??? Eosinophils % 02/26/2021 0.9  % Final   ??? Basophils % 02/26/2021 0.4  % Final   ??? Absolute Neutrophils 02/26/2021 11.7 (A) 1.8 - 7.8 10*9/L Final   ??? Absolute Lymphocytes 02/26/2021 1.1  1.1 - 3.6 10*9/L Final   ??? Absolute Monocytes 02/26/2021 1.3 (A) 0.3 - 0.8 10*9/L Final   ??? Absolute Eosinophils 02/26/2021 0.1  0.0 - 0.5 10*9/L Final   ??? Absolute Basophils 02/26/2021 0.1  0.0 - 0.1 10*9/L Final   ??? Anisocytosis 02/26/2021 Marked (A) Not Present Final   ??? SARS-CoV-2 PCR 02/26/2021 Negative  Negative Final   ??? Influenza A 02/26/2021 Negative  Negative Final   ??? Influenza B 02/26/2021 Negative  Negative Final   ??? RSV 02/26/2021 Negative  Negative Final   ??? Smear Review Comments 02/26/2021 See Comment (A) Undefined Final   ??? Giant Platelets 02/26/2021 Present (A) Not Present Final   ??? Urine Culture, Comprehensive 02/26/2021 >100,000 CFU/mL Escherichia coli (A)  Final   Admission on 02/06/2021, Discharged on 02/06/2021   Component Date Value Ref Range Status   ??? Color, UA 02/05/2021 Yellow   Final   ??? Clarity, UA 02/05/2021 Cloudy   Final   ??? Specific Gravity, UA 02/05/2021 1.020  1.005 - 1.040 Final   ??? pH, UA 02/05/2021 8.5  5.0 - 9.0 Final   ??? Leukocyte Esterase, UA 02/05/2021 Moderate (A) Negative Final   ??? Nitrite, UA 02/05/2021 Negative  Negative Final   ??? Protein, UA 02/05/2021 >300 mg/dl (A) Negative Final   ??? Glucose, UA 02/05/2021 Negative  Negative Final   ??? Ketones, UA 02/05/2021 Negative  Negative Final   ??? Urobilinogen, UA 02/05/2021 0.2 mg/dL   Final   ??? Bilirubin, UA 02/05/2021 Negative  Negative Final   ??? Blood, UA 02/05/2021 Large (A) Negative Final   ??? RBC, UA 02/05/2021 >100 (A) <4 /HPF Final   ??? WBC, UA 02/05/2021 15 (A) 0 - 5 /HPF Final   ??? Squam Epithel, UA 02/05/2021 <1  0 - 5 /HPF Final   ??? Bacteria, UA 02/05/2021 Moderate (A) None Seen /HPF Final   ??? WBC Clumps 02/05/2021 Occasional (A) None Seen /HPF Final   ??? Urine Culture, Comprehensive 02/05/2021 50,000 to 100,000 CFU/mL Escherichia coli (A)  Final   Office Visit on 02/04/2021   Component Date Value Ref Range Status   ??? Sodium 02/04/2021 139  135 - 145 mmol/L Final   ??? Potassium 02/04/2021 4.6 (A) 3.4 - 4.5 mmol/L Final   ??? Chloride 02/04/2021 102  98 - 107 mmol/L Final   ??? CO2 02/04/2021 32.0 (A) 20.0 - 31.0 mmol/L Final   ??? Anion Gap 02/04/2021 5  5 - 14 mmol/L Final   ??? BUN 02/04/2021 32 (A) 9 - 23 mg/dL Final   ??? Creatinine 02/04/2021 1.37 (A) 0.60 - 0.80  mg/dL Final   ??? BUN/Creatinine Ratio 02/04/2021 23   Final   ??? EGFR [name]-EPI Non-African American,* 02/04/2021 37 (A) >=60 mL/min/1.61m2 Final   ??? EGFR [name]-EPI African American, Fem* 02/04/2021 42 (A) >=60 mL/min/1.22m2 Final   ??? Glucose 02/04/2021 166  70 - 179 mg/dL Final   ??? Calcium 16/09/9603 10.0  8.7 - 10.4 mg/dL Final   ??? Color, UA 54/08/8118 Yellow   Final   ??? Clarity, UA 02/04/2021 Clear   Final   ??? Specific Gravity, UA 02/04/2021 1.010  1.005 - 1.030 Final   ??? pH, UA 02/04/2021 7.0  5.0 - 9.0 Final   ??? Leukocyte Esterase, UA 02/04/2021 Negative  Negative Final   ??? Nitrite, UA 02/04/2021 Negative  Negative Final   ??? Protein, UA 02/04/2021 Negative  Negative Final   ??? Glucose, UA 02/04/2021 Negative  Negative Final   ??? Ketones, UA 02/04/2021 Negative  Negative Final   ??? Urobilinogen, UA 02/04/2021 0.2 mg/dL  0.2 - 2.0 mg/dL Final   ??? Bilirubin, UA 02/04/2021 Negative  Negative Final   ??? Blood, UA 02/04/2021 Negative  Negative Final   ??? RBC, UA 02/04/2021 <1  0 - 3 /HPF Final   ??? WBC, UA 02/04/2021 <1  0 - 3 /HPF Final   ??? Squam Epithel, UA 02/04/2021 2  0 - 5 /HPF Final   ??? Bacteria, UA 02/04/2021 None Seen  None Seen /HPF Final   ??? Yeast, UA 02/04/2021 Many (A) None Seen /HPF Final   ??? WBC 02/04/2021 12.5 (A) 3.5 - 10.5 10*9/L Final   ??? RBC 02/04/2021 4.05  3.90 - 5.03 10*12/L Final   ??? HGB 02/04/2021 9.4 (A) 12.0 - 15.5 g/dL Final   ??? HCT 14/78/2956 32.1 (A) 35.0 - 44.0 % Final   ??? MCV 02/04/2021 79.4 (A) 82.0 - 98.0 fL Final   ??? MCH 02/04/2021 23.2 (A) 26.0 - 34.0 pg Final   ??? MCHC 02/04/2021 29.2 (A) 30.0 - 36.0 g/dL Final   ??? RDW 21/30/8657 25.9 (A) 12.0 - 15.0 % Final   ??? MPV 02/04/2021 8.9  7.0 - 10.0 fL Final   ??? Platelet 02/04/2021 272  150 - 450 10*9/L Final   Admission on 01/21/2021, Discharged on 01/24/2021   Component Date Value Ref Range Status   ??? Sodium 01/21/2021 136  135 - 145 mmol/L Final   ??? Potassium 01/21/2021 4.2  3.4 - 4.5 mmol/L Final   ??? Chloride 01/21/2021 100  98 - 107 mmol/L Final   ??? CO2 01/21/2021 28.9  20.0 - 31.0 mmol/L Final   ??? Anion Gap 01/21/2021 7  5 - 14 mmol/L Final   ??? BUN 01/21/2021 20  9 - 23 mg/dL Final   ??? Creatinine 01/21/2021 1.27 (A) 0.60 - 0.80 mg/dL Final   ??? BUN/Creatinine Ratio 01/21/2021 16   Final   ??? EGFR [name]-EPI Non-African American,* 01/21/2021 40 (A) >=60 mL/min/1.4m2 Final   ??? EGFR [name]-EPI African American, Fem* 01/21/2021 46 (A) >=60 mL/min/1.72m2 Final   ??? Glucose 01/21/2021 159  70 - 179 mg/dL Final   ??? Calcium 84/69/6295 9.2  8.7 - 10.4 mg/dL Final   ??? Color, UA 28/41/3244 Yellow   Final   ??? Clarity, UA 01/21/2021 Hazy   Final   ??? Specific Gravity, UA 01/21/2021 1.015  1.005 - 1.040 Final   ??? pH, UA 01/21/2021 7.5  5.0 - 9.0 Final   ??? Leukocyte Esterase, UA 01/21/2021 Moderate (A) Negative Final   ??? Nitrite,  UA 01/21/2021 Negative  Negative Final   ??? Protein, UA 01/21/2021 30 mg/dL (A) Negative Final   ??? Glucose, UA 01/21/2021 >1000 mg/dL (A) Negative Final   ??? Ketones, UA 01/21/2021 Negative  Negative Final   ??? Urobilinogen, UA 01/21/2021 0.2 mg/dL   Final   ??? Bilirubin, UA 01/21/2021 Negative  Negative Final   ??? Blood, UA 01/21/2021 Trace (A) Negative Final   ??? RBC, UA 01/21/2021 1  <4 /HPF Final   ??? WBC, UA 01/21/2021 >100 (A) 0 - 5 /HPF Final   ??? Squam Epithel, UA 01/21/2021 8 (A) 0 - 5 /HPF Final   ??? Bacteria, UA 01/21/2021 Occasional (A) None Seen /HPF Final   ??? WBC Clumps 01/21/2021 Few (A) None Seen /HPF Final   ??? Amorphous Crystal, UA 01/21/2021 Moderate  /HPF Final   ??? WBC 01/21/2021 20.2 (A) 3.5 - 10.5 10*9/L Final   ??? RBC 01/21/2021 4.46  3.90 - 5.03 10*12/L Final   ??? HGB 01/21/2021 9.6 (A) 12.0 - 15.5 g/dL Final   ??? HCT 29/56/2130 32.2 (A) 35.0 - 44.0 % Final   ??? MCV 01/21/2021 72.2 (A) 82.0 - 98.0 fL Final   ??? MCH 01/21/2021 21.5 (A) 26.0 - 34.0 pg Final   ??? MCHC 01/21/2021 29.8 (A) 30.0 - 36.0 g/dL Final   ??? RDW 86/57/8469 18.7 (A) 12.0 - 15.0 % Final   ??? MPV 01/21/2021 8.1  7.0 - 10.0 fL Final   ??? Platelet 01/21/2021 373  150 - 450 10*9/L Final   ??? nRBC 01/21/2021 0  <=4 /100 WBCs Final   ??? Neutrophils % 01/21/2021 83.3  % Final   ??? Lymphocytes % 01/21/2021 6.0  % Final   ??? Monocytes % 01/21/2021 9.5  % Final   ??? Eosinophils % 01/21/2021 0.6  % Final   ??? Basophils % 01/21/2021 0.6  % Final   ??? Absolute Neutrophils 01/21/2021 16.8 (A) 1.7 - 7.7 10*9/L Final   ??? Absolute Lymphocytes 01/21/2021 1.2  0.7 - 4.0 10*9/L Final   ??? Absolute Monocytes 01/21/2021 1.9 (A) 0.1 - 1.0 10*9/L Final   ??? Absolute Eosinophils 01/21/2021 0.1  0.0 - 0.7 10*9/L Final   ??? Absolute Basophils 01/21/2021 0.1  0.0 - 0.1 10*9/L Final   ??? Microcytosis 01/21/2021 Slight (A) Not Present Final   ??? Anisocytosis 01/21/2021 Moderate (A) Not Present Final   ??? SARS-CoV-2 PCR 01/21/2021 Negative  Negative Final   ??? Blood Culture, Routine 01/21/2021 No Growth at 5 days   Final   ??? Blood Culture, Routine 01/21/2021 No Growth at 5 days   Final   ??? Urine Culture, Comprehensive 01/21/2021 >100,000 CFU/mL Escherichia coli (A)  Final   ??? Urine Culture, Comprehensive 01/21/2021 >100,000 CFU/mL Enterococcus faecalis (A)  Corrected   ??? Urine Culture, Comprehensive 01/21/2021 >100,000 CFU/mL Aerococcus urinae (A)  Final   ??? BNP 01/21/2021 550.47 (A) <=100 pg/mL Final   ??? Glucose, POC 01/21/2021 150  70 - 179 mg/dL Final   ??? EKG Ventricular Rate 01/21/2021 78  BPM Final   ??? EKG Atrial Rate 01/21/2021 78  BPM Final   ??? EKG P-R Interval 01/21/2021 178  ms Final   ??? EKG QRS Duration 01/21/2021 86  ms Final   ??? EKG Q-T Interval 01/21/2021 438  ms Final   ??? EKG QTC Calculation 01/21/2021 499  ms Final   ??? EKG Calculated P Axis 01/21/2021 68  degrees Final   ??? EKG Calculated R Axis 01/21/2021 67  degrees Final   ??? EKG Calculated T Axis 01/21/2021 24  degrees Final   ??? QTC Fredericia 01/21/2021 478  ms Final   ??? Ferritin 01/21/2021 21.4  7.3 - 270.7 ng/mL Final   ??? Iron 01/21/2021 7 (A) 50 - 170 ug/dL Final   ??? TIBC 16/09/9603 282.2  mg/dL Final   ??? Transferrin 01/21/2021 224.0 (A) 250.0 - 380.0 mg/dL Final   ??? Iron Saturation (%) 01/21/2021 2  % Final   ??? Sodium 01/22/2021 137  135 - 145 mmol/L Final   ??? Potassium 01/22/2021 4.2  3.4 - 4.5 mmol/L Final   ??? Chloride 01/22/2021 98  98 - 107 mmol/L Final   ??? CO2 01/22/2021 32.0 (A) 20.0 - 31.0 mmol/L Final   ??? Anion Gap 01/22/2021 7  5 - 14 mmol/L Final   ??? BUN 01/22/2021 20  9 - 23 mg/dL Final   ??? Creatinine 01/22/2021 1.38 (A) 0.60 - 0.80 mg/dL Final   ??? BUN/Creatinine Ratio 01/22/2021 14   Final   ??? EGFR [name]-EPI Non-African American,* 01/22/2021 36 (A) >=60 mL/min/1.53m2 Final   ??? EGFR [name]-EPI African American, Fem* 01/22/2021 42 (A) >=60 mL/min/1.26m2 Final   ??? Glucose 01/22/2021 148  70 - 179 mg/dL Final   ??? Calcium 54/08/8118 9.3  8.7 - 10.4 mg/dL Final   ??? Magnesium 14/78/2956 2.7 (A) 1.6 - 2.6 mg/dL Final   ??? WBC 21/30/8657 14.6 (A) 3.5 - 10.5 10*9/L Final   ??? RBC 01/22/2021 4.31  3.90 - 5.03 10*12/L Final   ??? HGB 01/22/2021 9.2 (A) 12.0 - 15.5 g/dL Final   ??? HCT 84/69/6295 31.5 (A) 35.0 - 44.0 % Final   ??? MCV 01/22/2021 73.1 (A) 82.0 - 98.0 fL Final   ??? MCH 01/22/2021 21.4 (A) 26.0 - 34.0 pg Final   ??? MCHC 01/22/2021 29.3 (A) 30.0 - 36.0 g/dL Final   ??? RDW 28/41/3244 18.6 (A) 12.0 - 15.0 % Final   ??? MPV 01/22/2021 8.0  7.0 - 10.0 fL Final   ??? Platelet 01/22/2021 353  150 - 450 10*9/L Final   ??? nRBC 01/22/2021 0  <=4 /100 WBCs Final   ??? Neutrophils % 01/22/2021 79.2  % Final   ??? Lymphocytes % 01/22/2021 9.6  % Final   ??? Monocytes % 01/22/2021 8.3  % Final   ??? Eosinophils % 01/22/2021 1.7  % Final   ??? Basophils % 01/22/2021 1.2  % Final   ??? Absolute Neutrophils 01/22/2021 11.6 (A) 1.7 - 7.7 10*9/L Final   ??? Absolute Lymphocytes 01/22/2021 1.4  0.7 - 4.0 10*9/L Final   ??? Absolute Monocytes 01/22/2021 1.2 (A) 0.1 - 1.0 10*9/L Final   ??? Absolute Eosinophils 01/22/2021 0.2  0.0 - 0.7 10*9/L Final   ??? Absolute Basophils 01/22/2021 0.2 (A) 0.0 - 0.1 10*9/L Final   ??? Microcytosis 01/22/2021 Slight (A) Not Present Final   ??? Anisocytosis 01/22/2021 Moderate (A) Not Present Final   ??? Glucose, POC 01/22/2021 193 (A) 70 - 179 mg/dL Final   ??? Glucose, POC 01/22/2021 206 (A) 70 - 179 mg/dL Final   ??? Glucose, POC 01/22/2021 120  70 - 179 mg/dL Final   ??? Glucose, POC 01/22/2021 162  70 - 179 mg/dL Final   ??? Sodium 12/30/7251 136  135 - 145 mmol/L Final   ??? Potassium 01/23/2021 4.1  3.4 - 4.5 mmol/L Final   ??? Chloride 01/23/2021 98  98 - 107 mmol/L Final   ??? CO2 01/23/2021 32.2 (A) 20.0 -  31.0 mmol/L Final   ??? Anion Gap 01/23/2021 6  5 - 14 mmol/L Final   ??? BUN 01/23/2021 23  9 - 23 mg/dL Final   ??? Creatinine 01/23/2021 1.39 (A) 0.60 - 0.80 mg/dL Final   ??? BUN/Creatinine Ratio 01/23/2021 17   Final   ??? EGFR [name]-EPI Non-African American,* 01/23/2021 36 (A) >=60 mL/min/1.37m2 Final   ??? EGFR [name]-EPI African American, Fem* 01/23/2021 42 (A) >=60 mL/min/1.26m2 Final   ??? Glucose 01/23/2021 114  70 - 179 mg/dL Final   ??? Calcium 16/09/9603 9.1  8.7 - 10.4 mg/dL Final   ??? Magnesium 54/08/8118 2.6  1.6 - 2.6 mg/dL Final   ??? WBC 14/78/2956 11.7 (A) 3.5 - 10.5 10*9/L Final   ??? RBC 01/23/2021 3.94  3.90 - 5.03 10*12/L Final   ??? HGB 01/23/2021 8.4 (A) 12.0 - 15.5 g/dL Final   ??? HCT 21/30/8657 28.8 (A) 35.0 - 44.0 % Final   ??? MCV 01/23/2021 73.1 (A) 82.0 - 98.0 fL Final   ??? MCH 01/23/2021 21.4 (A) 26.0 - 34.0 pg Final   ??? MCHC 01/23/2021 29.3 (A) 30.0 - 36.0 g/dL Final   ??? RDW 84/69/6295 18.0 (A) 12.0 - 15.0 % Final   ??? MPV 01/23/2021 8.2  7.0 - 10.0 fL Final   ??? Platelet 01/23/2021 311  150 - 450 10*9/L Final   ??? nRBC 01/23/2021 0  <=4 /100 WBCs Final   ??? Neutrophils % 01/23/2021 70.7  % Final   ??? Lymphocytes % 01/23/2021 13.3  % Final   ??? Monocytes % 01/23/2021 11.6  % Final   ??? Eosinophils % 01/23/2021 3.3  % Final   ??? Basophils % 01/23/2021 1.1  % Final   ??? Absolute Neutrophils 01/23/2021 8.3 (A) 1.7 - 7.7 10*9/L Final   ??? Absolute Lymphocytes 01/23/2021 1.6  0.7 - 4.0 10*9/L Final   ??? Absolute Monocytes 01/23/2021 1.4 (A) 0.1 - 1.0 10*9/L Final   ??? Absolute Eosinophils 01/23/2021 0.4  0.0 - 0.7 10*9/L Final   ??? Absolute Basophils 01/23/2021 0.1  0.0 - 0.1 10*9/L Final   ??? Microcytosis 01/23/2021 Slight (A) Not Present Final   ??? Anisocytosis 01/23/2021 Slight (A) Not Present Final   ??? Glucose, POC 01/23/2021 135  70 - 179 mg/dL Final   ??? Glucose, POC 01/23/2021 234 (A) 70 - 179 mg/dL Final   ??? Glucose, POC 01/23/2021 128  70 - 179 mg/dL Final   ??? Glucose, POC 01/23/2021 149  70 - 179 mg/dL Final   ??? Magnesium 28/41/3244 2.6  1.6 - 2.6 mg/dL Final   ??? WBC 12/30/7251 11.4 (A) 3.5 - 10.5 10*9/L Final   ??? RBC 01/24/2021 3.91  3.90 - 5.03 10*12/L Final   ??? HGB 01/24/2021 8.5 (A) 12.0 - 15.5 g/dL Final   ??? HCT 66/44/0347 28.2 (A) 35.0 - 44.0 % Final   ??? MCV 01/24/2021 72.1 (A) 82.0 - 98.0 fL Final   ??? MCH 01/24/2021 21.8 (A) 26.0 - 34.0 pg Final   ??? MCHC 01/24/2021 30.2  30.0 - 36.0 g/dL Final   ??? RDW 42/59/5638 17.8 (A) 12.0 - 15.0 % Final   ??? MPV 01/24/2021 7.8  7.0 - 10.0 fL Final   ??? Platelet 01/24/2021 300  150 - 450 10*9/L Final   ??? Sodium 01/24/2021 134 (A) 135 - 145 mmol/L Final   ??? Potassium 01/24/2021 4.1  3.4 - 4.5 mmol/L Final   ??? Chloride 01/24/2021 97 (A) 98 - 107 mmol/L Final   ???  CO2 01/24/2021 32.1 (A) 20.0 - 31.0 mmol/L Final   ??? Anion Gap 01/24/2021 5  5 - 14 mmol/L Final   ??? BUN 01/24/2021 27 (A) 9 - 23 mg/dL Final   ??? Creatinine 01/24/2021 1.51 (A) 0.60 - 0.80 mg/dL Final   ??? BUN/Creatinine Ratio 01/24/2021 18   Final   ??? EGFR [name]-EPI Non-African American,* 01/24/2021 33 (A) >=60 mL/min/1.10m2 Final   ??? EGFR [name]-EPI African American, Fem* 01/24/2021 38 (A) >=60 mL/min/1.63m2 Final   ??? Glucose 01/24/2021 141  70 - 179 mg/dL Final   ??? Calcium 16/09/9603 9.1  8.7 - 10.4 mg/dL Final   ??? Glucose, POC 01/24/2021 151  70 - 179 mg/dL Final   Appointment on 01/17/2021   Component Date Value Ref Range Status   ??? Color, UA 01/17/2021 Yellow   Final   ??? Clarity, UA 01/17/2021 Cloudy   Final   ??? Specific Gravity, UA 01/17/2021 1.020  1.005 - 1.040 Final   ??? pH, UA 01/17/2021 8.5  5.0 - 9.0 Final   ??? Leukocyte Esterase, UA 01/17/2021 Moderate (A) Negative Final   ??? Nitrite, UA 01/17/2021 Negative  Negative Final   ??? Protein, UA 01/17/2021 Negative  Negative Final   ??? Glucose, UA 01/17/2021 >1000 mg/dL (A) Negative Final   ??? Ketones, UA 01/17/2021 Negative  Negative Final   ??? Urobilinogen, UA 01/17/2021 0.2 mg/dL   Final   ??? Bilirubin, UA 01/17/2021 Negative  Negative Final   ??? Blood, UA 01/17/2021 Moderate (A) Negative Final   ??? RBC, UA 01/17/2021 5 (A) <4 /HPF Final   ??? WBC, UA 01/17/2021 15 (A) 0 - 5 /HPF Final   ??? Squam Epithel, UA 01/17/2021 20 (A) 0 - 5 /HPF Final   ??? Bacteria, UA 01/17/2021 Many (A) None Seen /HPF Final   ??? WBC Clumps 01/17/2021 Occasional (A) None Seen /HPF Final   ??? Urine Culture, Comprehensive 01/17/2021 50,000 to 100,000 CFU/mL Escherichia coli (A)  Final   ??? Urine Culture, Comprehensive 01/17/2021 50,000 to 100,000 CFU/mL Enterococcus faecalis (A)  Final   Office Visit on 01/16/2021   Component Date Value Ref Range Status   ??? EKG Ventricular Rate 01/16/2021 98  BPM Final   ??? EKG QRS Duration 01/16/2021 80  ms Final   ??? EKG Q-T Interval 01/16/2021 394  ms Final   ??? EKG QTC Calculation 01/16/2021 503  ms Final   ??? EKG Calculated R Axis 01/16/2021 54  degrees Final   ??? EKG Calculated T Axis 01/16/2021 -28  degrees Final   ??? QTC Fredericia 01/16/2021 464  ms Final   No results displayed because visit has over 200 results.      Admission on 01/05/2021, Discharged on 01/06/2021   Component Date Value Ref Range Status   ??? EKG Ventricular Rate 01/05/2021 130  BPM Final   ??? EKG Atrial Rate 01/05/2021 170  BPM Final   ??? EKG QRS Duration 01/05/2021 78  ms Final   ??? EKG Q-T Interval 01/05/2021 212  ms Final   ??? EKG QTC Calculation 01/05/2021 311  ms Final   ??? EKG Calculated R Axis 01/05/2021 72 degrees Final   ??? EKG Calculated T Axis 01/05/2021 -140  degrees Final   ??? QTC Fredericia 01/05/2021 274  ms Final   ??? Influenza A 01/06/2021 Negative  Negative Final   ??? Influenza B 01/06/2021 Negative  Negative Final   ??? RSV 01/06/2021 Negative  Negative Final   ??? SARS-CoV-2 PCR 01/06/2021  Negative  Negative Final   ??? Magnesium 01/06/2021 2.6  1.6 - 2.6 mg/dL Final   ??? Sodium 16/09/9603 136  135 - 145 mmol/L Final   ??? Potassium 01/06/2021 3.2 (A) 3.4 - 4.5 mmol/L Final   ??? Chloride 01/06/2021 94 (A) 98 - 107 mmol/L Final   ??? Anion Gap 01/06/2021 8  5 - 14 mmol/L Final   ??? CO2 01/06/2021 34.5 (A) 20.0 - 31.0 mmol/L Final   ??? BUN 01/06/2021 29 (A) 9 - 23 mg/dL Final   ??? Creatinine 01/06/2021 1.28 (A) 0.60 - 0.80 mg/dL Final   ??? BUN/Creatinine Ratio 01/06/2021 23   Final   ??? EGFR [name]-EPI Non-African American,* 01/06/2021 40 (A) >=60 mL/min/1.69m2 Final   ??? EGFR [name]-EPI African American, Fem* 01/06/2021 46 (A) >=60 mL/min/1.82m2 Final   ??? Glucose 01/06/2021 212 (A) 70 - 179 mg/dL Final   ??? Calcium 54/08/8118 9.1  8.7 - 10.4 mg/dL Final   ??? Albumin 14/78/2956 3.2 (A) 3.4 - 5.0 g/dL Final   ??? Total Protein 01/06/2021 7.3  5.7 - 8.2 g/dL Final   ??? Total Bilirubin 01/06/2021 0.6  0.3 - 1.2 mg/dL Final   ??? AST 21/30/8657 15  <=34 U/L Final   ??? ALT 01/06/2021 12  10 - 49 U/L Final   ??? Alkaline Phosphatase 01/06/2021 84  46 - 116 U/L Final   ??? hsTroponin I 01/06/2021 54 (A) <=34 ng/L Final   ??? WBC 01/06/2021 13.8 (A) 3.5 - 10.5 10*9/L Final   ??? RBC 01/06/2021 4.08  3.90 - 5.03 10*12/L Final   ??? HGB 01/06/2021 9.0 (A) 12.0 - 15.5 g/dL Final   ??? HCT 84/69/6295 29.7 (A) 35.0 - 44.0 % Final   ??? MCV 01/06/2021 72.8 (A) 82.0 - 98.0 fL Final   ??? MCH 01/06/2021 22.1 (A) 26.0 - 34.0 pg Final   ??? MCHC 01/06/2021 30.4  30.0 - 36.0 g/dL Final   ??? RDW 28/41/3244 17.8 (A) 12.0 - 15.0 % Final   ??? MPV 01/06/2021 8.8  7.0 - 10.0 fL Final   ??? Platelet 01/06/2021 280  150 - 450 10*9/L Final   ??? nRBC 01/06/2021 0  <=4 /100 WBCs Final   ??? Neutrophils % 01/06/2021 76.8  % Final   ??? Lymphocytes % 01/06/2021 8.0  % Final   ??? Monocytes % 01/06/2021 13.1  % Final   ??? Eosinophils % 01/06/2021 1.3  % Final   ??? Basophils % 01/06/2021 0.8  % Final   ??? Absolute Neutrophils 01/06/2021 10.6 (A) 1.7 - 7.7 10*9/L Final   ??? Absolute Lymphocytes 01/06/2021 1.1  0.7 - 4.0 10*9/L Final   ??? Absolute Monocytes 01/06/2021 1.8 (A) 0.1 - 1.0 10*9/L Final   ??? Absolute Eosinophils 01/06/2021 0.2  0.0 - 0.7 10*9/L Final   ??? Absolute Basophils 01/06/2021 0.1  0.0 - 0.1 10*9/L Final   ??? Microcytosis 01/06/2021 Slight (A) Not Present Final   ??? Anisocytosis 01/06/2021 Slight (A) Not Present Final   ??? TSH 01/06/2021 1.572  0.550 - 4.780 uIU/mL Final   ??? Color, UA 01/06/2021 Yellow   Final   ??? Clarity, UA 01/06/2021 Clear   Final   ??? Specific Gravity, UA 01/06/2021 1.015  1.005 - 1.040 Final   ??? pH, UA 01/06/2021 7.0  5.0 - 9.0 Final   ??? Leukocyte Esterase, UA 01/06/2021 Negative  Negative Final   ??? Nitrite, UA 01/06/2021 Negative  Negative Final   ??? Protein, UA 01/06/2021 Negative  Negative Final   ???  Glucose, UA 01/06/2021 500 mg/dL (A) Negative Final   ??? Ketones, UA 01/06/2021 Negative  Negative Final   ??? Urobilinogen, UA 01/06/2021 0.2 mg/dL   Final   ??? Bilirubin, UA 01/06/2021 Negative  Negative Final   ??? Blood, UA 01/06/2021 Negative  Negative Final   ??? RBC, UA 01/06/2021 <1  <4 /HPF Final   ??? WBC, UA 01/06/2021 4  0 - 5 /HPF Final   ??? Squam Epithel, UA 01/06/2021 23 (A) 0 - 5 /HPF Final   ??? Bacteria, UA 01/06/2021 Rare (A) None Seen /HPF Final   ??? Hyphal Yeast 01/06/2021 Occasional (A) None Seen /HPF Final   ??? Hyaline Casts, UA 01/06/2021 <1 (A) <=0 /LPF Final   ??? Yeast, UA 01/06/2021 Many (A) None Seen /HPF Final   ??? Mucus, UA 01/06/2021 Rare (A) None Seen /HPF Final   ??? Specimen Source 01/06/2021 Venous   Final   ??? FIO2 Venous 01/06/2021 Not Specified   Final   ??? pH, Venous 01/06/2021 7.45 (A) 7.32 - 7.43 Final   ??? pCO2, Ven 01/06/2021 56  40 - 60 mm Hg Final   ??? pO2, Ven 01/06/2021 77 (A) 30 - 55 mm Hg Final   ??? HCO3, Ven 01/06/2021 37 (A) 22 - 27 mmol/L Final   ??? Base Excess, Ven 01/06/2021 15.2 (A) -2.0 - 2.0 Final   ??? O2 Saturation, Venous 01/06/2021 95.6 (A) 40.0 - 85.0 % Final   ??? hsTroponin I 01/06/2021 61 (A) <=34 ng/L Final   ??? Urine Culture, Comprehensive 01/06/2021 Mixed Urogenital Flora   Final   Appointment on 01/03/2021   Component Date Value Ref Range Status   ??? Sodium 01/03/2021 135  135 - 145 mmol/L Final   ??? Potassium 01/03/2021 3.7  3.4 - 4.5 mmol/L Final   ??? Chloride 01/03/2021 94 (A) 98 - 107 mmol/L Final   ??? CO2 01/03/2021 35.0 (A) 20.0 - 31.0 mmol/L Final   ??? Anion Gap 01/03/2021 6  5 - 14 mmol/L Final   ??? BUN 01/03/2021 37 (A) 9 - 23 mg/dL Final   ??? Creatinine 01/03/2021 1.45 (A) 0.60 - 0.80 mg/dL Final   ??? BUN/Creatinine Ratio 01/03/2021 26   Final   ??? EGFR [name]-EPI Non-African American,* 01/03/2021 34 (A) >=60 mL/min/1.82m2 Final   ??? EGFR [name]-EPI African American, Fem* 01/03/2021 40 (A) >=60 mL/min/1.63m2 Final   ??? Glucose 01/03/2021 248 (A) 70 - 179 mg/dL Final   ??? Calcium 16/09/9603 9.7  8.7 - 10.4 mg/dL Final   ??? BNP 54/08/8118 568.33 (A) <=100 pg/mL Final   ??? Color, UA 01/03/2021 Yellow   Final   ??? Clarity, UA 01/03/2021 Clear   Final   ??? Specific Gravity, UA 01/03/2021 1.010  1.005 - 1.030 Final   ??? pH, UA 01/03/2021 6.0  5.0 - 9.0 Final   ??? Leukocyte Esterase, UA 01/03/2021 Negative  Negative Final   ??? Nitrite, UA 01/03/2021 Negative  Negative Final   ??? Protein, UA 01/03/2021 Negative  Negative Final   ??? Glucose, UA 01/03/2021 >1000 mg/dL (A) Negative Final   ??? Ketones, UA 01/03/2021 Negative  Negative Final   ??? Urobilinogen, UA 01/03/2021 0.2 mg/dL  0.2 - 2.0 mg/dL Final   ??? Bilirubin, UA 01/03/2021 Negative  Negative Final   ??? Blood, UA 01/03/2021 Negative  Negative Final   ??? RBC, UA 01/03/2021 <1  0 - 3 /HPF Final   ??? WBC, UA 01/03/2021 20 (A) 0 - 3 /HPF Final   ???  Squam Epithel, UA 01/03/2021 4  0 - 5 /HPF Final   ??? Bacteria, UA 01/03/2021 Many (A) None Seen /HPF Final   ??? WBC Clumps 01/03/2021 Rare (A) None Seen /HPF Final   ??? Urine Culture, Comprehensive 01/03/2021 >100,000 CFU/mL Escherichia coli (A)  Final   There may be more visits with results that are not included.       Lab Results   Component Value Date    PRO-BNP 3,796.0 (H) 12/23/2020    PRO-BNP 1,970.0 (H) 05/30/2020    PRO-BNP 3,200.0 (H) 04/27/2020    Creatinine 1.75 (H) 02/27/2021    Creatinine 1.57 (H) 02/26/2021    Creatinine 0.79 01/06/2011    BUN 28 (H) 02/27/2021    BUN 28 (H) 02/26/2021    BUN 20 01/06/2011    Sodium 136 02/27/2021    Sodium 140 01/06/2011    Potassium 3.8 02/27/2021    Potassium 4.1 01/06/2011    CO2 28.7 02/27/2021    CO2 30 01/06/2011    Magnesium 2.6 01/24/2021    Magnesium 2.0 01/06/2011    Total Bilirubin 0.5 02/26/2021    INR 2.27 09/27/2020    INR 1.0 01/06/2011       Lab Results   Component Value Date    Digoxin Level 0.9 10/01/2020       Lab Results   Component Value Date    TSH 1.572 01/06/2021    Cholesterol 132 08/23/2020    Triglycerides 109 08/23/2020    HDL 32 (L) 08/23/2020    Non-HDL Cholesterol 100 08/23/2020    LDL Calculated 78 08/23/2020       Lab Results   Component Value Date    WBC 12.0 (H) 02/27/2021    WBC 12.5 (H) 01/06/2011    HGB 11.1 (L) 02/27/2021    HGB 12.9 01/06/2011    HCT 35.5 02/27/2021    HCT 40.8 01/06/2011    Platelet 154 02/27/2021    Platelet 260 01/06/2011       Pertinent Test Results from Today:  None    Other pertinent records were reviewed.    The following are further history from the patient's EPIC record for reference:     Past Medical History:   Diagnosis Date   ??? Acute on chronic diastolic (congestive) heart failure (CMS-HCC) 08/23/2020   ??? Arthritis    ??? Calculus of kidney    ??? Calculus of ureter    ??? CHF (congestive heart failure) (CMS-HCC)    ??? COPD (chronic obstructive pulmonary disease) (CMS-HCC)    ??? Diabetes (CMS-HCC)    ??? Gangrenous cholecystitis 10/11/2020   ??? GERD (gastroesophageal reflux disease)    ??? Hydronephrosis    ??? Hypertension    ??? Intermediate coronary syndrome (CMS-HCC) 03/13/2014   ??? Lumbar stenosis    ??? Microscopic hematuria    ??? Nausea alone    ??? Neuropathy    ??? Nocturia    ??? Other chronic cystitis    ??? Pulmonary hypertension (CMS-HCC)    ??? Renal colic    ??? Sleep apnea    ??? Unstable angina pectoris (CMS-HCC) 03/13/2014       Past Surgical History:   Procedure Laterality Date   ??? BACK SURGERY  1995   ??? CARPAL TUNNEL RELEASE Left 2014   ??? HYSTERECTOMY  1971   ??? IR INSERT CHOLECYSTOSMY TUBE PERCUTANEOUS  10/02/2020    IR INSERT CHOLECYSTOSMY TUBE PERCUTANEOUS 10/02/2020 Braulio Conte, MD IMG VIR [facility]&V [facility]   ???  LUMBAR DISC SURGERY     ??? PR REMOVAL GALLBLADDER N/A 10/06/2020    Procedure: CHOLECYSTECTOMY;  Surgeon: Katherina Mires, MD;  Location: MAIN OR [facility];  Service: Trauma   ??? PR RIGHT HEART CATH O2 SATURATION & CARDIAC OUTPUT N/A 09/30/2020    Procedure: Right Heart Catheterization;  Surgeon: Neal Dy, MD;  Location: [facility] CATH;  Service: Cardiology   ??? PR RIGHT HEART CATH O2 SATURATION & CARDIAC OUTPUT N/A 01/09/2021    Procedure: Right Heart Catheterization;  Surgeon: Lesle Reek, MD;  Location: [facility] CATH;  Service: Cardiology

## 2021-03-24 LAB — PRO-BNP: PRO-BNP: 905 pg/mL — ABNORMAL HIGH (ref 0.0–450.0)

## 2021-03-26 DIAGNOSIS — J302 Other seasonal allergic rhinitis: Principal | ICD-10-CM

## 2021-03-26 MED ORDER — MONTELUKAST 10 MG TABLET
ORAL_TABLET | Freq: Every day | ORAL | 3 refills | 90.00000 days | Status: CP
Start: 2021-03-26 — End: ?

## 2021-03-28 DIAGNOSIS — J302 Other seasonal allergic rhinitis: Principal | ICD-10-CM

## 2021-03-28 MED ORDER — FERROUS SULFATE 325 MG (65 MG IRON) TABLET
ORAL_TABLET | ORAL | 3 refills | 84.00000 days | Status: CP
Start: 2021-03-28 — End: 2022-03-28

## 2021-03-31 NOTE — Unmapped (Signed)
[facility] Refill Coordination Note    Specialty Medication(s) to be Shipped:   General Specialty: Vyndamax 61mg     Other medication(s) to be shipped: No additional medications requested for fill at this time     Barbara Huber, DOB: Sep 05, 1941  Phone: [phone] (home)       All above HIPAA information was verified with patient's family member, daughter.     Was a Nurse, learning disability used for this call? No    Completed refill call assessment today to schedule patient's medication shipment from the [facility] [phone]).       Specialty medication(s) and dose(s) confirmed: Regimen is correct and unchanged.   Changes to medications: Runell reports no changes at this time.  Changes to insurance: No  Questions for the pharmacist: No    Confirmed patient received a Conservation officer, historic buildings and a Surveyor, mining with first shipment. The patient will receive a drug information handout for each medication shipped and additional FDA Medication Guides as required.       DISEASE/MEDICATION-SPECIFIC INFORMATION        N/A    SPECIALTY MEDICATION ADHERENCE     Medication Adherence    Patient reported X missed doses in the last month: 0  Specialty Medication: Vyndamax 61mg   Patient is on additional specialty medications: No  Any gaps in refill history greater than 2 weeks in the last 3 months: no  Demonstrates understanding of importance of adherence: yes  Informant: child/children  Reliability of informant: reliable  Confirmed plan for next specialty medication refill: delivery by pharmacy  Refills needed for supportive medications: not needed                Vyndamax 61 mg: 1 days of medicine on hand         SHIPPING     Shipping address confirmed in Epic.     Delivery Scheduled: Yes, Expected medication delivery date: 04/01/2021.     Medication will be delivered via Same Day Courier to the prescription address in Epic WAM.    Barbara Huber   [facility] Specialty Technician

## 2021-04-01 NOTE — Unmapped (Signed)
Barbara Huber 's Vyndamax shipment will be delayed as a result of insufficient inventory of the drug.     I have reached out to the patient  at [phone] and communicated the delay. We will reschedule the medication for the delivery date that the patient agreed upon.  We have confirmed the delivery date as 04/02/21, via same day courier.

## 2021-04-02 NOTE — Unmapped (Signed)
.  Barbara Huber 's Vyndamax shipment will be delayed as a result of insufficient inventory of the drug.     I have reached out to the patient  at [phone] and left a voicemail message.  We will call the patient back to reschedule the delivery upon resolution. We via same day courier., have not confirmed the new delivery date.

## 2021-04-03 MED FILL — VYNDAMAX 61 MG CAPSULE: ORAL | 30 days supply | Qty: 30 | Fill #2

## 2021-04-04 ENCOUNTER — Ambulatory Visit: Admit: 2021-04-04 | Discharge: 2021-04-05 | Payer: MEDICARE

## 2021-04-04 DIAGNOSIS — D500 Iron deficiency anemia secondary to blood loss (chronic): Principal | ICD-10-CM

## 2021-04-04 DIAGNOSIS — J302 Other seasonal allergic rhinitis: Principal | ICD-10-CM

## 2021-04-04 DIAGNOSIS — G629 Polyneuropathy, unspecified: Principal | ICD-10-CM

## 2021-04-04 DIAGNOSIS — B001 Herpesviral vesicular dermatitis: Principal | ICD-10-CM

## 2021-04-04 DIAGNOSIS — N1832 Type 2 diabetes mellitus with stage 3b chronic kidney disease, without long-term current use of insulin (CMS-HCC): Principal | ICD-10-CM

## 2021-04-04 DIAGNOSIS — E1122 Type 2 diabetes mellitus with diabetic chronic kidney disease: Principal | ICD-10-CM

## 2021-04-04 MED ORDER — MONTELUKAST 10 MG TABLET
ORAL_TABLET | Freq: Every day | ORAL | 3 refills | 90 days | Status: CP
Start: 2021-04-04 — End: ?

## 2021-04-04 MED ORDER — GABAPENTIN 300 MG CAPSULE
ORAL_CAPSULE | Freq: Three times a day (TID) | ORAL | 5 refills | 30 days | Status: CP
Start: 2021-04-04 — End: 2021-10-01

## 2021-04-04 MED ORDER — METFORMIN 500 MG TABLET
ORAL_TABLET | Freq: Two times a day (BID) | ORAL | 3 refills | 90 days | Status: CP
Start: 2021-04-04 — End: ?

## 2021-04-04 MED ORDER — SHINGRIX (PF) 50 MCG/0.5 ML INTRAMUSCULAR SUSPENSION, KIT
INTRAMUSCULAR | 1 refills | 0 days
Start: 2021-04-04 — End: ?

## 2021-04-04 MED ORDER — VALACYCLOVIR 500 MG TABLET
ORAL_TABLET | Freq: Two times a day (BID) | ORAL | 3 refills | 3 days | Status: CP
Start: 2021-04-04 — End: 2021-04-07

## 2021-04-04 MED FILL — SHINGRIX (PF) 50 MCG/0.5 ML INTRAMUSCULAR SUSPENSION, KIT: INTRAMUSCULAR | 1 days supply | Qty: 0.5 | Fill #0

## 2021-04-04 NOTE — Unmapped (Unsigned)
[facility]       Type of visit:  face to face    Reason for visit: F/U    Questions / Concerns that need to be addressed:     Diabetes:  ??? Regularly checking blood sugars?: yes  o If yes, when? Complete log for past 7 days  Date Before Breakfast After Breakfast Before Lunch After Lunch Before Dinner After Dinner Before Bed    04/04/2021  101                                                                                                                               Screening BP- 117/57  72        Allergies reviewed: Yes    Medication reviewed: Yes  Pended refills? No        HCDM reviewed and updated in Epic:    We are working to make sure all of our patients??? wishes are updated in Epic and part of that is documenting a Environmental health practitioner for each patient  A Health Care Decision Rodena Piety is someone you choose who can make health care decisions for you if you are not able ??? who would you most want to do this for you????  is already up to date.        BPAs completed:        COVID-19 Vaccine Summary  Which COVID-19 Vaccine was administered  Pfizer  Type:  Dates Given:  12/12/2020                   If no: Are you interested in scheduling?     Immunization History   Administered Date(s) Administered   ??? COVID-19 VACC,MRNA,(PFIZER)(PF)(IM) 01/19/2020, 02/09/2020, 12/12/2020   ??? Influenza Vaccine Quad (IIV4 PF) 56mo+ injectable 10/16/2015, 09/08/2019, 08/30/2020   ??? Influenza Virus Vaccine, unspecified formulation 09/27/2014, 09/23/2017, 09/21/2018   ??? Influenza, High Dose (IIV4) 65 yrs & older 09/28/2016   ??? PNEUMOCOCCAL POLYSACCHARIDE 23 09/28/2016   ??? Pneumococcal Conjugate 13-Valent 08/22/2019       __________________________________________________________________________________________    SCREENINGS COMPLETED IN FLOWSHEETS    HARK Screening       AUDIT       PHQ2       PHQ9          P4 Suicidality Screener                GAD7       COPD Assessment       Falls Risk .imcres

## 2021-04-04 NOTE — Unmapped (Signed)
Internal Medicine Clinic Visit    Reason for visit: Follow up diabetes    A/P:  [facility] home care  ***  There are no diagnoses linked to this encounter.   Health Maintenance  Health Maintenance Due   Topic Date Due   ??? Retinal Eye Exam  Never done   ??? DTaP/Tdap/Td Vaccines (1 - Tdap) Never done   ??? Zoster Vaccines (1 of 2) Never done   ??? DEXA Scan-Start Age 80  Never done        Return in about 3 months (around 07/04/2021) for recheck DM.  Next Visit:   ???     HPI:Pt is a 80 y.o. female with a history of cardiac amyloidosis with heart failure, afib, COPD on home oxygen, DM, frequent UTI.  Concerns for today:   Wants valtrex prn for cold sores  Has been having billing issues with oxygen, with [employer].   She is on 3 liters of oxygen, has portable and oxygen concentrator and Trilogy cpap: Blood sugars have been around 100's . Currently uses on [facility]  Lab Results   Component Value Date    A1C 7.4 (H) 12/12/2020    A1C 5.7 (H) 05/30/2020    A1C 5.1 08/22/2019    A1C 8.1 (H) 04/15/2015    A1C 8.2 (H) 04/13/2015      Lab Results   Component Value Date    CREATININE 1.62 (H) 03/21/2021          Problem List:  Patient Active Problem List   Diagnosis   ??? Spinal stenosis of lumbar region   ??? Thoracic or lumbosacral neuritis or radiculitis   ??? Lumbosacral spondylosis   ??? COPD (chronic obstructive pulmonary disease) (CMS-HCC)   ??? Sleep apnea in adult   ??? Essential hypertension   ??? Type 2 diabetes mellitus with stage 3b chronic kidney disease, without long-term current use of insulin (CMS-HCC)   ??? Nephrolithiasis   ??? Chronic cystitis   ??? Incomplete bladder emptying   ??? Nocturia   ??? Chronic atrial fibrillation (CMS-HCC)   ??? Peripheral neuropathy   ??? Chronic respiratory failure with hypoxia (CMS-HCC)   ??? Anemia   ??? Persistent atrial fibrillation (CMS-HCC)   ??? Anemia in chronic kidney disease   ??? Chronic kidney disease, stage III (moderate) (CMS-HCC)   ??? Enrolled in chronic care management   ??? Pulmonary hypertension (CMS-HCC)   ??? Shortness of breath   ??? Chronic prescription opiate use   ??? Dyspnea on exertion   ??? Lower extremity edema   ??? Hyponatremia   ??? Acute kidney injury superimposed on chronic kidney disease (CMS-HCC)   ??? 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 Cardiac amyloidosis (CMS-HCC)   ??? Complicated UTI (urinary tract infection)       Medications:  Reviewed in EPIC  Soc History and Family History reviewed in Epic    Physical Exam:   Vital Signs:  Vitals:    04/04/21 1534   Resp: 18   Temp: 36.9 ??C (98.5 ??F)   TempSrc: Oral   SpO2: 94%   Weight: 97.6 kg (215 lb 3.2 oz)   Height: 162.6 cm (5' 4.02)     Wt Readings from Last 3 Encounters:   04/04/21 97.6 kg (215 lb 3.2 oz)   03/21/21 96.2 kg (212 lb)   02/26/21 96.6 kg (213 lb)        Gen: Well appearing, NAD, wearing oxygen  CV: RRR, no murmurs  Pulm:  CTA bilaterally, no crackles or wheezes  Walking steadily with a walker  Records review  Lab Results   Component Value Date    CREATININE 1.62 (H) 03/21/2021    CHOL 132 08/23/2020    HDL 32 (L) 08/23/2020    LDL 78 08/23/2020    NONHDL 100 08/23/2020    TRIG 109 08/23/2020    A1C 7.4 (H) 12/12/2020           The 10-year ASCVD risk score Denman George DC Jr., et al., 2013) is: 19.3%     Medication adherence and barriers to the treatment plan have been addressed. Opportunities to optimize healthy behaviors have been discussed. Patient / caregiver voiced understanding.    I personally spent 30 minutes face-to-face and non-face-to-face in the care of this patient, which includes all pre, intra, and post visit time on the date of service.    {TIP - HCC- RAFF Pilot- Clinical Documentation Specialist Recommendations-  RIJ   This text will self delete upon signing note:[id number]} 100's . No SE from metformin  Lab Results   Component Value Date    A1C 7.4 (H) 12/12/2020    A1C 5.7 (H) 05/30/2020    A1C 5.1 08/22/2019    A1C 8.1 (H) 04/15/2015    A1C 8.2 (H) 04/13/2015      Lab Results   Component Value Date    CREATININE 1.62 (H) 03/21/2021          Problem List:  Patient Active Problem List   Diagnosis   ??? Spinal stenosis of lumbar region   ??? Thoracic or lumbosacral neuritis or radiculitis   ??? Lumbosacral spondylosis   ??? COPD (chronic obstructive pulmonary disease) (CMS-HCC)   ??? Sleep apnea in adult   ??? Essential hypertension   ??? Type 2 diabetes mellitus with stage 3b chronic kidney disease, without long-term current use of insulin (CMS-HCC)   ??? Nephrolithiasis   ??? Chronic cystitis   ??? Incomplete bladder emptying   ??? Nocturia   ??? Peripheral neuropathy   ??? Chronic respiratory failure with hypoxia (CMS-HCC)   ??? Anemia   ??? Persistent atrial fibrillation (CMS-HCC)   ??? Anemia in chronic kidney disease   ??? Chronic kidney disease, stage III (moderate) (CMS-HCC)   ??? Enrolled in chronic care management   ??? Pulmonary hypertension (CMS-HCC)   ??? Shortness of breath   ??? Chronic prescription opiate use   ??? Dyspnea on exertion   ??? 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 Cardiac amyloidosis (CMS-HCC)   ??? Complicated UTI (urinary tract infection)   ??? Recurrent cold sores       Medications:  Reviewed in EPIC  Soc History and Family History reviewed in Epic    Physical Exam:   Vital Signs:  Vitals:    04/04/21 1534   Resp: 18   Temp: 36.9 ??C (98.5 ??F)   TempSrc: Oral   SpO2: 94%   Weight: 97.6 kg (215 lb 3.2 oz)   Height: 162.6 cm (5' 4.02)     Wt Readings from Last 3 Encounters:   04/04/21 97.6 kg (215 lb 3.2 oz)   03/21/21 96.2 kg (212 lb)   02/26/21 96.6 kg (213 lb)        Gen: Well appearing, NAD, wearing oxygen  CV: RRR, no murmurs  Pulm: CTA bilaterally, no crackles or wheezes  Neurologically: a little more withdrawn; lets her daughter do most of the talking  Walking steadily with a walker  Records review  Lab Results   Component Value Date    CREATININE 1.62 (H) 03/21/2021    CHOL 132 08/23/2020    HDL 32 (L) 08/23/2020    LDL 78 08/23/2020    NONHDL 100 08/23/2020    TRIG 109 08/23/2020    A1C 7.4 (H) 12/12/2020           The 10-year ASCVD risk score Denman George DC Jr., et al., 2013) is: 19.3%     Medication adherence and barriers to the treatment plan have been addressed. Opportunities to optimize healthy behaviors have been discussed. Patient / caregiver voiced understanding.    I personally spent 30 minutes face-to-face and non-face-to-face in the care of this patient, which includes all pre, intra, and post visit time on the date of service.

## 2021-04-04 NOTE — Unmapped (Addendum)
I would suggest you get your shingles vaccine.   I put in an order for you to get a full blood count when you see Dr [name].   Let's decrease gabapentin to 600 mg three times per day.

## 2021-04-08 DIAGNOSIS — I5032 Chronic diastolic (congestive) heart failure: Principal | ICD-10-CM

## 2021-04-08 NOTE — Unmapped (Signed)
IV iron therapy plan entered.  Total Iron deficit calculated to be 1380 mg and therefore ordered 3 Feraheme infusions.   Spoke with patient and her daughter and informed them re: plan.    I spent a total of 7 minutes on the phone with the patient delivering clinical care and providing education/counseling.      Rafael Bihari, PharmD, BCACP, CPP  Clinical Pharmacist Practitioner

## 2021-04-15 DIAGNOSIS — D649 Anemia, unspecified: Principal | ICD-10-CM

## 2021-04-15 DIAGNOSIS — I10 Essential (primary) hypertension: Principal | ICD-10-CM

## 2021-04-15 NOTE — Unmapped (Signed)
I was the supervising physician in the delivery of the service. Kate Sable, MD

## 2021-04-15 NOTE — Unmapped (Signed)
CCM Follow-up Call    PCP Action:Is patient eligible for iron infusion, states unable to tolerate po iron.     CCM Action:Assist with infusion appointment based on patient's report of severe muscle fatigue, considering going to hospital ER.     Patient Action:Infusion      Patient reports severe muscle weakness r/t low iron, would like iron infusion expedited.   Denies chest pain, shortness of breath, headache, dizziness, temperature discrepancy or heart palpitations.   Patient reports her daughter called to make an appointment and was told that they are very busy and cannot see me for a long time.    Denies need for medication refills  Reports BG and BP are all running really good; no results given.

## 2021-04-18 ENCOUNTER — Ambulatory Visit
Admit: 2021-04-18 | Discharge: 2021-04-21 | Disposition: A | Discharge status: Home / Self Care | Payer: MEDICARE | Admitting: Geriatric Medicine

## 2021-04-18 LAB — CBC W/ AUTO DIFF
BASOPHILS ABSOLUTE COUNT: 0.1 10*9/L (ref 0.0–0.1)
BASOPHILS RELATIVE PERCENT: 0.8 %
EOSINOPHILS ABSOLUTE COUNT: 0 10*9/L (ref 0.0–0.5)
EOSINOPHILS RELATIVE PERCENT: 0.2 %
HEMATOCRIT: 27.4 % — ABNORMAL LOW (ref 34.0–44.0)
HEMOGLOBIN: 8.5 g/dL — ABNORMAL LOW (ref 11.3–14.9)
LYMPHOCYTES ABSOLUTE COUNT: 1.1 10*9/L (ref 1.1–3.6)
LYMPHOCYTES RELATIVE PERCENT: 7.6 %
MEAN CORPUSCULAR HEMOGLOBIN CONC: 31.1 g/dL — ABNORMAL LOW (ref 32.0–36.0)
MEAN CORPUSCULAR HEMOGLOBIN: 26.1 pg (ref 25.9–32.4)
MEAN CORPUSCULAR VOLUME: 83.8 fL (ref 77.6–95.7)
MEAN PLATELET VOLUME: 8.1 fL (ref 6.8–10.7)
MONOCYTES ABSOLUTE COUNT: 1.8 10*9/L — ABNORMAL HIGH (ref 0.3–0.8)
MONOCYTES RELATIVE PERCENT: 12.5 %
NEUTROPHILS ABSOLUTE COUNT: 11.6 10*9/L — ABNORMAL HIGH (ref 1.8–7.8)
NEUTROPHILS RELATIVE PERCENT: 78.9 %
NUCLEATED RED BLOOD CELLS: 0 /100{WBCs} (ref <=4)
PLATELET COUNT: 230 10*9/L (ref 150–450)
RED BLOOD CELL COUNT: 3.26 10*12/L — ABNORMAL LOW (ref 3.95–5.13)
RED CELL DISTRIBUTION WIDTH: 17.7 % — ABNORMAL HIGH (ref 12.2–15.2)
WBC ADJUSTED: 14.7 10*9/L — ABNORMAL HIGH (ref 3.6–11.2)

## 2021-04-18 LAB — COMPREHENSIVE METABOLIC PANEL
ALBUMIN: 3.1 g/dL — ABNORMAL LOW (ref 3.4–5.0)
ALKALINE PHOSPHATASE: 56 U/L (ref 46–116)
ALT (SGPT): <7 U/L — ABNORMAL LOW (ref 10–49)
ANION GAP: 7 mmol/L (ref 5–14)
AST (SGOT): 12 U/L (ref <=34)
BILIRUBIN TOTAL: 0.5 mg/dL (ref 0.3–1.2)
BLOOD UREA NITROGEN: 39 mg/dL — ABNORMAL HIGH (ref 9–23)
BUN / CREAT RATIO: 20
CALCIUM: 8.6 mg/dL — ABNORMAL LOW (ref 8.7–10.4)
CHLORIDE: 101 mmol/L (ref 98–107)
CO2: 27.9 mmol/L (ref 20.0–31.0)
CREATININE: 1.99 mg/dL — ABNORMAL HIGH
EGFR CKD-EPI AA FEMALE: 27 mL/min/{1.73_m2} — ABNORMAL LOW (ref >=60)
EGFR CKD-EPI NON-AA FEMALE: 23 mL/min/{1.73_m2} — ABNORMAL LOW (ref >=60)
GLUCOSE RANDOM: 219 mg/dL — ABNORMAL HIGH (ref 70–179)
POTASSIUM: 4.2 mmol/L (ref 3.4–4.8)
PROTEIN TOTAL: 6.9 g/dL (ref 5.7–8.2)
SODIUM: 136 mmol/L (ref 135–145)

## 2021-04-18 LAB — URINALYSIS WITH CULTURE REFLEX
BILIRUBIN UA: NEGATIVE
GLUCOSE UA: NEGATIVE
KETONES UA: NEGATIVE
NITRITE UA: NEGATIVE
PH UA: 6.5 (ref 5.0–9.0)
PROTEIN UA: 30 — AB
RBC UA: >100 /HPF — ABNORMAL HIGH (ref <4)
SPECIFIC GRAVITY UA: 1.01 (ref 1.005–1.040)
SQUAMOUS EPITHELIAL: 2 /HPF (ref 0–5)
UROBILINOGEN UA: 0.2
WBC UA: 50 /HPF — ABNORMAL HIGH (ref 0–5)

## 2021-04-18 LAB — IRON PANEL
IRON: <20 ug/dL — ABNORMAL LOW
TOTAL IRON BINDING CAPACITY (CALC): 236.9 mg/dL
TRANSFERRIN: 188 mg/dL — ABNORMAL LOW

## 2021-04-18 LAB — LACTATE, VENOUS, WHOLE BLOOD: LACTATE BLOOD VENOUS: 1.1 mmol/L (ref 0.5–1.8)

## 2021-04-18 LAB — LACTATE SEPSIS, VENOUS: LACTATE BLOOD VENOUS: 2 mmol/L — ABNORMAL HIGH (ref 0.5–1.8)

## 2021-04-18 MED ADMIN — sodium chloride 0.9% (NS) bolus 1,641 mL: 30 mL/kg | INTRAVENOUS | @ 17:00:00 | Stop: 2021-04-18

## 2021-04-18 MED ADMIN — sodium chloride 0.9% (NS) bolus 1,000 mL: 1000 mL | INTRAVENOUS | @ 16:00:00 | Stop: 2021-04-18

## 2021-04-18 MED ADMIN — piperacillin-tazobactam (ZOSYN) 3.375 g in sodium chloride 0.9 % (NS) 100 mL IVPB-connector bag: 3.375 g | INTRAVENOUS | @ 17:00:00 | Stop: 2021-04-18

## 2021-04-18 MED ADMIN — vancomycin (VANCOCIN) 2000 mg IVPB in 500 mL sodium chloride 0.9% (premix): 2000 mg | INTRAVENOUS | @ 17:00:00 | Stop: 2021-04-18

## 2021-04-18 NOTE — Unmapped (Signed)
Pt reports has UTI symptoms x 3 days. Pt reports was started on amoxicillin with no relief. Pt reports feeling weak

## 2021-04-18 NOTE — Unmapped (Signed)
[facility]  Emergency Department Provider Note        ED Clinical Impression     Final diagnoses:   Sepsis, due to unspecified organism, unspecified whether acute organ dysfunction present (CMS-HCC)       Initial Impression, ED Course, Assessment and Plan     Impression: 80 y.o. female with extensive medical history, including acute kidney injury, CHF, arthritis, renal calculi, A. fib, COPD, diabetes, GERD, hypertension, hyponatremia, angina, pulmonary hypertension, neuropathy and sleep apnea to the ED for possible UTI. Patient brought by daughter who states that patient has had intermittent UTI's x 2 mths. One week ago began having urinary urgency and frequency, in addition to malodorous urine. On physical exam, patient is non toxic appearing with breathing even and non labored and lungs clear to auscultation throughout. Abdomen soft, non distended and non tender to palpation. No CVA tenderness noted. Remainder of physical exam overall unremarkable.       VS abnormal on arrival with ED with low grade temp of 99 ??F, patient hypotensive at 96/40, concerning for sepsis. Will initiate basic labs, lactate and blood cultures. EKG and chest ordered. Will initiate IV NS bolus.  Also concerned for possible DKA v AKI v other infectious process v ACS.    1143  CBC resulted, significant for WBC elevated at 14.7, RBC decreased 3.27, hemoglobin decreased to 8.5 and hematocrit decreased at 27.4. Elevated WBC concerning for worsening infectious process, H&H consistent with patient history of anemia. Daughter states that PCP has ordered iron infusions, but patient has not yet started infusion sessions.     1157  Lactate elevated at 2.0, consistent with possible sepsis. Given patient history of frequent UTIs will initiate recommended treatment for urosepsis with vancomycin and Zosyn. Likely disposition admission based on pending studies.     1223  Xray resulted, Suspected mild pulmonary edema Correlate clinically.    1454  UA consistent with UTI. Will page [facility] for admission for urosepsis.    Additional Medical Decision Making       I independently visualized the EKG tracing.   I independently visualized the radiology images.   I reviewed the patient's prior medical records.   I discussed the case with admitting team  Additional history obtained from daughter at bedside    Labs and radiology results that were available during my care of the patient were independently reviewed by me and considered in my medical decision making.    Portions of this record have been created using Scientist, clinical (histocompatibility and immunogenetics). Dictation errors have been sought, but may not have been identified and corrected.  ____________________________________________    I have reviewed the triage vital signs and the nursing notes.     History     Chief Complaint  Possible UTI      HPI   Barbara Huber is a 80 y.o. female with extensive medical history, including acute kidney injury, CHF, arthritis, renal calculi, A. fib, COPD, diabetes, GERD, hypertension, hyponatremia, angina, pulmonary hypertension, neuropathy and sleep apnea to the ED for possible UTI. Patient brought by daughter who states that patient has had intermittent UTI's x 2 mths. One week ago began having urinary urgency and frequency, in addition to malodorous urine. Was evaluated by primary nephrologist Wednesday and informed of recurrence of UTI, placed on amoxicillin. Patient endorses increased weakness, fatigue and thirst since initiation of treatment. This morning had a temperature of 101 ??F. Reported symptoms to primary nephrologist and was referred to the ED for further  evaluation. Patient denies any chest pain, abdominal pain, shortness of breath or sick contacts. States that she has been compliant with all medications.         Past Medical History:   Diagnosis Date   ??? Acute kidney injury superimposed on chronic kidney disease (CMS-HCC) 10/11/2020   ??? Acute on chronic diastolic (congestive) heart failure (CMS-HCC) 08/23/2020   ??? Arthritis    ??? Calculus of kidney    ??? Calculus of ureter    ??? CHF (congestive heart failure) (CMS-HCC)    ??? Chronic atrial fibrillation (CMS-HCC) 07/20/2019   ??? COPD (chronic obstructive pulmonary disease) (CMS-HCC)    ??? Diabetes (CMS-HCC)    ??? Gangrenous cholecystitis 10/11/2020   ??? GERD (gastroesophageal reflux disease)    ??? Hydronephrosis    ??? Hypertension    ??? Hyponatremia 10/11/2020   ??? Intermediate coronary syndrome (CMS-HCC) 03/13/2014   ??? Lower extremity edema 09/28/2020   ??? Lumbar stenosis    ??? Microscopic hematuria    ??? Nausea alone    ??? Neuropathy    ??? Nocturia    ??? Other chronic cystitis    ??? Pulmonary hypertension (CMS-HCC)    ??? Renal colic    ??? Sleep apnea    ??? Unstable angina pectoris (CMS-HCC) 03/13/2014       Patient Active Problem List   Diagnosis   ??? Spinal stenosis of lumbar region   ??? Thoracic or lumbosacral neuritis or radiculitis   ??? Lumbosacral spondylosis   ??? COPD (chronic obstructive pulmonary disease) (CMS-HCC)   ??? Sleep apnea in adult   ??? Essential hypertension   ??? Type 2 diabetes mellitus with stage 3b chronic kidney disease, without long-term current use of insulin (CMS-HCC)   ??? Nephrolithiasis   ??? Chronic cystitis   ??? Incomplete bladder emptying   ??? Nocturia   ??? Sepsis (CMS-HCC)   ??? Peripheral neuropathy   ??? Chronic respiratory failure with hypoxia (CMS-HCC)   ??? Anemia   ??? Persistent atrial fibrillation (CMS-HCC)   ??? Anemia in chronic kidney disease   ??? Chronic kidney disease, stage III (moderate) (CMS-HCC)   ??? Enrolled in chronic care management   ??? Pulmonary hypertension (CMS-HCC)   ??? Shortness of breath   ??? Chronic prescription opiate use   ??? Dyspnea on exertion   ??? 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 Cardiac amyloidosis (CMS-HCC)   ??? Complicated UTI (urinary tract infection)   ??? Recurrent cold sores       Past Surgical History:   Procedure Laterality Date   ??? BACK SURGERY  1995   ??? CARPAL TUNNEL RELEASE Left 2014   ??? HYSTERECTOMY  1971   ??? IR INSERT CHOLECYSTOSMY TUBE PERCUTANEOUS  10/02/2020    IR INSERT CHOLECYSTOSMY TUBE PERCUTANEOUS 10/02/2020 Braulio Conte, MD IMG [facility]IR [facility]   ??? LUMBAR DISC SURGERY     ??? PR REMOVAL GALLBLADDER N/A 10/06/2020    Procedure: CHOLECYSTECTOMY;  Surgeon: Katherina Mires, MD;  Location: MAIN OR [facility];  Service: Trauma   ??? PR RIGHT HEART CATH O2 SATURATION & CARDIAC OUTPUT N/A 09/30/2020    Procedure: Right Heart Catheterization;  Surgeon: Neal Dy, MD;  Location: [facility] CATH;  Service: Cardiology   ??? PR RIGHT HEART CATH O2 SATURATION & CARDIAC OUTPUT N/A 01/09/2021    Procedure: Right Heart Catheterization;  Surgeon: Lesle Reek, MD;  Location: [facility] CATH;  Service: Cardiology       No  current facility-administered medications for this encounter.    Current Outpatient Medications:   ???  ACCU-CHEK AVIVA PLUS TEST STRP Strp, USE TO CHECK BLOOD SUGAR 3 TIMES A DAY BEFORE MEALS, Disp: , Rfl:   ???  acetaminophen (TYLENOL) 500 MG tablet, Take 1,000 mg by mouth daily as needed for pain. , Disp: , Rfl:   ???  albuterol HFA 90 mcg/actuation inhaler, Inhale 2 puffs every eight (8) hours as needed for wheezing., Disp: , Rfl:   ???  amiodarone (PACERONE) 200 MG tablet, Take 1 tablet (200 mg total) by mouth daily., Disp: 30 tablet, Rfl: 1  ???  apixaban (ELIQUIS) 5 mg Tab, Take 1 tablet (5 mg total) by mouth Two (2) times a day., Disp: 60 tablet, Rfl: 11  ???  cefpodoxime (VANTIN) 200 MG tablet, Take 1 tablet (200 mg total) by mouth Two (2) times a day for 7 days., Disp: 14 tablet, Rfl: 0  ???  estradioL (ESTRACE) 0.01 % (0.1 mg/gram) vaginal cream, INSERT ONE GRAM VAGINALLY AT BEDTIME, Disp: , Rfl:   ???  fluticasone propion-salmeteroL (ADVAIR HFA) 115-21 mcg/actuation inhaler, Inhale 2 puffs Two (2) times a day., Disp: 12 g, Rfl: 11  ???  gabapentin (NEURONTIN) 300 MG capsule, Take 1 capsule (300 mg total) by mouth Three (3) times a day., Disp: 90 capsule, Rfl: 0  ??? lancets Misc, 1 each by Miscellaneous route daily. Accu Check, Disp: 100 each, Rfl: 6  ???  metFORMIN (GLUCOPHAGE) 500 MG tablet, Take 1 tablet (500 mg total) by mouth 2 (two) times a day with meals., Disp: 180 tablet, Rfl: 3  ???  montelukast (SINGULAIR) 10 mg tablet, Take 1 tablet (10 mg total) by mouth daily., Disp: 90 tablet, Rfl: 3  ???  NARCAN 4 mg/actuation nasal spray, 1 spray into alternating nostrils once as needed (opioid overdose).  (Patient not taking: Reported on 02/04/2021), Disp: , Rfl:   ???  oxyCODONE (OXYCONTIN) 10 mg TR12 12 hr crush resistant ER/CR tablet, Take 10 mg by mouth every twelve (12) hours., Disp: , Rfl:   ???  OXYGEN-AIR DELIVERY SYSTEMS MISC, 5 L by Miscellaneous route., Disp: , Rfl:   ???  rosuvastatin (CRESTOR) 5 MG tablet, Take 1 tablet (5 mg total) by mouth every other day., Disp: 3015 tablet, Rfl: 11  ???  spironolactone (ALDACTONE) 50 MG tablet, Take 1 tablet (50 mg total) by mouth daily., Disp: 90 tablet, Rfl: 3  ???  tafamidis 61 mg cap, Take 1 capsule (61 mg) by mouth daily., Disp: 30 capsule, Rfl: 11  ???  tiotropium bromide (SPIRIVA RESPIMAT) 2.5 mcg/actuation inhalation mist, Inhale 2 puffs daily., Disp: 4 g, Rfl: 11  ???  torsemide (DEMADEX) 20 MG tablet, Take 2 tablets (40 mg total) by mouth daily. May also take 2 tablets (40 mg total) daily as needed (weight gain)., Disp: 60 tablet, Rfl: 11  ???  valACYclovir (VALTREX) 500 MG tablet, Take 1 tablet (500 mg total) by mouth Two (2) times a day for 1 day., Disp: 2 tablet, Rfl: 0  ???  varicella-zoster gE-AS01B, PF, (SHINGRIX, PF,) 50 mcg/0.5 mL SusR injection, Inject 0.5 mL into the muscle., Disp: 0.5 mL, Rfl: 1    Allergies  Nitrofurantoin and Lipitor [atorvastatin]    Family History   Problem Relation Age of Onset   ??? Hypertension Mother    ??? Cancer Father         COLON CANCER   ??? Anesthesia problems Neg Hx    ??? Broken bones Neg Hx    ???  Clotting disorder Neg Hx    ??? Collagen disease Neg Hx    ??? Diabetes Neg Hx    ??? Dislocations Neg Hx    ??? Fibromyalgia Neg Hx    ??? Gout Neg Hx    ??? Hemophilia Neg Hx    ??? Osteoporosis Neg Hx    ??? Rheumatologic disease Neg Hx    ??? Scoliosis Neg Hx    ??? Severe sprains Neg Hx    ??? Sickle cell anemia Neg Hx    ??? Spinal Compression Fracture Neg Hx    ??? GU problems Neg Hx    ??? Kidney cancer Neg Hx    ??? Prostate cancer Neg Hx        Social History  Social History     Tobacco Use   ??? Smoking status: Former Smoker     Packs/day: 0.33     Types: Cigarettes   ??? Smokeless tobacco: Never Used   ??? Tobacco comment: Quit a few years ago   Vaping Use   ??? Vaping Use: Never used   Substance Use Topics   ??? Alcohol use: No   ??? Drug use: No       Review of Systems  Constitutional: See HPI  Ears:Negative for pain or ear pulling.   Eyes: Negative for visual changes.  ENT: Negative for sore throat.  Cardiovascular: Negative for chest pain.  Respiratory: Negative for shortness of breath.  Gastrointestinal: Negative for abdominal pain, vomiting or diarrhea.  Genitourinary: See HPI  Musculoskeletal: Negative for back pain.  Skin: Negative for rash.  Neurological: Negative for headaches, focal weakness or numbness.  All other systems have been reviewed and are negative except as otherwise documented.     Physical Exam     Vitals:    04/20/21 1615 04/21/21 0021 04/21/21 0632 04/21/21 0740   BP: 104/56 111/78  103/51   Pulse: 65 67  61   Resp: 20 19  19    Temp: 36.2 ??C (97.2 ??F) 36.3 ??C (97.3 ??F)  35.8 ??C (96.4 ??F)   TempSrc: Oral Oral  Oral   SpO2: 91% 92%  91%   Weight:   (!) 101.4 kg (223 lb 8 oz)    Height:         Labs Reviewed   COMPREHENSIVE METABOLIC PANEL - Abnormal; Notable for the following components:       Result Value    BUN 39 (*)     Creatinine 1.99 (*)     EGFR [name]-EPI Non-African American, Female 65 (*)     EGFR [name]-EPI African American, Female 32 (*)     Glucose 219 (*)     Calcium 8.6 (*)     Albumin 3.1 (*)     ALT <7 (*)     All other components within normal limits   LACTATE SEPSIS, VENOUS - Abnormal; Notable for the following components:    Lactate, Venous 2.0 (*)     All other components within normal limits   URINALYSIS WITH CULTURE REFLEX - Abnormal; Notable for the following components:    Leukocyte Esterase, UA Trace (*)     Protein, UA 30 mg/dL (*)     Blood, UA Large (*)     RBC, UA >100 (*)     WBC, UA 50 (*)     Bacteria, UA Few (*)     All other components within normal limits   IRON PANEL - Abnormal; Notable for the following components:  Iron <20 (*)     Transferrin 188.0 (*)     All other components within normal limits   BASIC METABOLIC PANEL - Abnormal; Notable for the following components:    BUN 32 (*)     Creatinine 1.65 (*)     EGFR [name]-EPI Non-African American, Female 54 (*)     EGFR [name]-EPI African American, Female 58 (*)     Calcium 8.1 (*)     All other components within normal limits   MAGNESIUM - Abnormal; Notable for the following components:    Magnesium 2.8 (*)     All other components within normal limits   CBC - Abnormal; Notable for the following components:    WBC 13.1 (*)     RBC 3.08 (*)     HGB 8.2 (*)     HCT 26.0 (*)     MCHC 31.3 (*)     RDW 17.2 (*)     All other components within normal limits   BASIC METABOLIC PANEL - Abnormal; Notable for the following components:    BUN 35 (*)     Creatinine 1.75 (*)     EGFR [name]-EPI Non-African American, Female 54 (*)     EGFR [name]-EPI African American, Female 66 (*)     Calcium 8.2 (*)     All other components within normal limits   CBC - Abnormal; Notable for the following components:    WBC 11.8 (*)     RBC 3.01 (*)     HGB 7.9 (*)     HCT 25.7 (*)     MCHC 30.9 (*)     RDW 16.9 (*)     All other components within normal limits   BASIC METABOLIC PANEL - Abnormal; Notable for the following components:    BUN 35 (*)     Creatinine 1.68 (*)     EGFR [name]-EPI Non-African American, Female 37 (*)     EGFR [name]-EPI African American, Female 80 (*)     Calcium 8.1 (*)     All other components within normal limits   MAGNESIUM - Abnormal; Notable for the following components:    Magnesium 2.9 (*)     All other components within normal limits   CBC - Abnormal; Notable for the following components:    RBC 3.01 (*)     HGB 7.9 (*)     HCT 25.4 (*)     MCHC 31.2 (*)     RDW 17.1 (*)     All other components within normal limits   POCT GLUCOSE, INTERFACED - Abnormal; Notable for the following components:    Glucose, POC 239 (*)     All other components within normal limits   POCT GLUCOSE, INTERFACED - Abnormal; Notable for the following components:    Glucose, POC 190 (*)     All other components within normal limits   CBC W/ AUTO DIFF - Abnormal; Notable for the following components:    WBC 14.7 (*)     RBC 3.26 (*)     HGB 8.5 (*)     HCT 27.4 (*)     MCHC 31.1 (*)     RDW 17.7 (*)     Absolute Neutrophils 11.6 (*)     Absolute Monocytes 1.8 (*)     Anisocytosis Slight (*)     All other components within normal limits   RAPID INFLUENZA/RSV/COVID PCR - Normal    Narrative:  This test was performed using the Cepheid Xpert Xpress SARS-CoV-2/Flu/RSV plus assay, which has been validated by the CLIA-certified, CAP-inspected [facility]. FDA has granted Emergency Use Authorization for this test. Negative results do not preclude infection and should be interpreted along with clinical observations, patient history, and epidemiological information. Information for providers and patients can be found here: [email or web address]   BLOOD CULTURE - Normal   BLOOD CULTURE - Normal   URINE CULTURE - Normal    Narrative:     Specimen Source: Clean Catch   LACTATE, VENOUS, WHOLE BLOOD - Normal   FERRITIN - Normal   MAGNESIUM - Normal   POCT GLUCOSE, INTERFACED - Normal   POCT GLUCOSE, INTERFACED - Normal   POCT GLUCOSE, INTERFACED - Normal   POCT GLUCOSE, INTERFACED - Normal   CBC W/ DIFFERENTIAL    Narrative:     The following orders were created for panel order CBC w/ Differential.  Procedure                               Abnormality         Status                     ---------                               -----------         ------                     CBC w/ [id number]]         Abnormal            Final result                 Please view results for these tests on the individual orders.   PLATELET ESTIMATE         Constitutional: Alert and oriented. Well appearing and in no distress.  Eyes: Conjunctivae are normal. PERRLA intact. EOM wnl. No drainage noted.        Head: Normocephalic and atraumatic.       Nose: No congestion.       Mouth/Throat: Mucous membranes are moist.       Neck: No stridor.  Hematological/Lymphatic/Immunilogical: No cervical lymphadenopathy.  Cardiovascular: Normal rate, regular rhythm. Normal and symmetric distal pulses are present in all extremities. No peripheral edema noted.   Respiratory: Normal respiratory effort. Breath sounds are normal.  Gastrointestinal: Abdomen soft, non distended and non tender to palpation. No CVA tenderness noted  Neurologic: Normal speech and language. No gross focal neurologic deficits are appreciated.  Skin: Skin is warm, dry and intact. No rash noted.  Psychiatric: Mood and affect are normal. Speech and behavior are normal.      EKG     NORMAL SINUS RHYTHM   LOW VOLTAGE   POOR R WAVE PROGRESSION IN ANTERIOR PRECORDIAL LEADS   ABNORMAL ECG   WHEN COMPARED WITH ECG OF 21-Jan-2021 23:36,   NO SIGNIFICANT CHANGE WAS FOUND     Radiology     Final result by Norwood Levo Rynties, MD (04/18/21 12:25:02)                Impression:      Suspected mild pulmonary edema Correlate clinically.            Narrative:  EXAM: XR CHEST PORTABLE   DATE: 04/18/2021 11:53 AM   ACCESSION: [id number]   DICTATED: 04/18/2021 12:23 PM   INTERPRETATION LOCATION: [facility]     CLINICAL INDICATION: 80 years old Female with FEVER ??     COMPARISON: Chest 01/21/2021     TECHNIQUE: Portable Chest Radiograph.     FINDINGS:     No consolidation or infiltrate. There is prominence of interstitial lung markings. Images are degraded by superimposed soft tissue.     No pleural effusion or pneumothorax     Stable cardiomediastinal silhouette.                   I independently visualized these images.    Procedures     Procedure(s) performed: None.           Orion Crook, FNP  04/22/21 2045

## 2021-04-19 LAB — BASIC METABOLIC PANEL
ANION GAP: 8 mmol/L (ref 5–14)
BLOOD UREA NITROGEN: 32 mg/dL — ABNORMAL HIGH (ref 9–23)
BUN / CREAT RATIO: 19
CALCIUM: 8.1 mg/dL — ABNORMAL LOW (ref 8.7–10.4)
CHLORIDE: 102 mmol/L (ref 98–107)
CO2: 26.9 mmol/L (ref 20.0–31.0)
CREATININE: 1.65 mg/dL — ABNORMAL HIGH
EGFR CKD-EPI AA FEMALE: 34 mL/min/{1.73_m2} — ABNORMAL LOW (ref >=60)
EGFR CKD-EPI NON-AA FEMALE: 29 mL/min/{1.73_m2} — ABNORMAL LOW (ref >=60)
GLUCOSE RANDOM: 151 mg/dL (ref 70–179)
POTASSIUM: 4.3 mmol/L (ref 3.4–4.8)
SODIUM: 137 mmol/L (ref 135–145)

## 2021-04-19 LAB — CBC
HEMATOCRIT: 26 % — ABNORMAL LOW (ref 34.0–44.0)
HEMOGLOBIN: 8.2 g/dL — ABNORMAL LOW (ref 11.3–14.9)
MEAN CORPUSCULAR HEMOGLOBIN CONC: 31.3 g/dL — ABNORMAL LOW (ref 32.0–36.0)
MEAN CORPUSCULAR HEMOGLOBIN: 26.5 pg (ref 25.9–32.4)
MEAN CORPUSCULAR VOLUME: 84.5 fL (ref 77.6–95.7)
MEAN PLATELET VOLUME: 8.4 fL (ref 6.8–10.7)
PLATELET COUNT: 202 10*9/L (ref 150–450)
RED BLOOD CELL COUNT: 3.08 10*12/L — ABNORMAL LOW (ref 3.95–5.13)
RED CELL DISTRIBUTION WIDTH: 17.2 % — ABNORMAL HIGH (ref 12.2–15.2)
WBC ADJUSTED: 13.1 10*9/L — ABNORMAL HIGH (ref 3.6–11.2)

## 2021-04-19 LAB — MAGNESIUM: MAGNESIUM: 2.8 mg/dL — ABNORMAL HIGH (ref 1.6–2.6)

## 2021-04-19 LAB — FERRITIN: FERRITIN: 43.7 ng/mL

## 2021-04-19 MED ADMIN — sodium ferric gluconate (FERRLECIT) 125 mg in sodium chloride (NS) 0.9 % 100 mL IVPB: 125 mg | INTRAVENOUS | @ 21:00:00 | Stop: 2021-04-21

## 2021-04-19 MED ADMIN — cefTRIAXone (ROCEPHIN) 2 g in sodium chloride 0.9 % (NS) 100 mL IVPB-connector bag: 2 g | INTRAVENOUS | @ 06:00:00 | Stop: 2021-04-25

## 2021-04-19 MED ADMIN — amiodarone (PACERONE) tablet 200 mg: 200 mg | ORAL | @ 13:00:00

## 2021-04-19 MED ADMIN — insulin lispro (HumaLOG) injection 0-12 Units: 0-12 [IU] | SUBCUTANEOUS | @ 17:00:00

## 2021-04-19 MED ADMIN — oxyCODONE (OxyCONTIN) 12 hr crush resistant ER/CR tablet 10 mg: 10 mg | ORAL | @ 03:00:00 | Stop: 2021-05-02

## 2021-04-19 MED ADMIN — spironolactone (ALDACTONE) tablet 50 mg: 50 mg | ORAL | @ 13:00:00

## 2021-04-19 MED ADMIN — valACYclovir (VALTREX) tablet 500 mg: 500 mg | ORAL | @ 19:00:00 | Stop: 2021-04-22

## 2021-04-19 MED ADMIN — montelukast (SINGULAIR) tablet 10 mg: 10.0 mg | ORAL | @ 03:00:00

## 2021-04-19 MED ADMIN — fluticasone furoate-vilanteroL (BREO ELLIPTA) 100-25 mcg/dose inhaler 1 puff: 1 | RESPIRATORY_TRACT | @ 13:00:00

## 2021-04-19 MED ADMIN — polyethylene glycol (MIRALAX) packet 17 g: 17 g | ORAL | @ 03:00:00 | Stop: 2021-04-18

## 2021-04-19 MED ADMIN — gabapentin (NEURONTIN) capsule 800 mg: 800 mg | ORAL | @ 13:00:00 | Stop: 2021-04-19

## 2021-04-19 MED ADMIN — gabapentin (NEURONTIN) capsule 800 mg: 800 mg | ORAL | @ 03:00:00

## 2021-04-19 MED ADMIN — torsemide (DEMADEX) tablet 40 mg: 40 mg | ORAL | @ 13:00:00

## 2021-04-19 MED ADMIN — apixaban (ELIQUIS) tablet 5 mg: 5 mg | ORAL | @ 13:00:00

## 2021-04-19 MED ADMIN — apixaban (ELIQUIS) tablet 5 mg: 5 mg | ORAL | @ 03:00:00

## 2021-04-19 MED ADMIN — umeclidinium (INCRUSE ELLIPTA) 62.5 mcg/actuation inhaler 1 puff: 1 | RESPIRATORY_TRACT | @ 13:00:00

## 2021-04-19 MED ADMIN — ondansetron (ZOFRAN-ODT) disintegrating tablet 4 mg: 4 mg | ORAL | @ 23:00:00 | Stop: 2021-04-19

## 2021-04-19 MED ADMIN — oxyCODONE (OxyCONTIN) 12 hr crush resistant ER/CR tablet 10 mg: 10 mg | ORAL | @ 13:00:00 | Stop: 2021-05-02

## 2021-04-19 MED ADMIN — gabapentin (NEURONTIN) capsule 600 mg: 600 mg | ORAL | @ 19:00:00

## 2021-04-19 NOTE — Unmapped (Incomplete)
Problem: Adult Inpatient Plan of Care  Goal: Plan of Care Review  Outcome: Progressing  Goal: Patient-Specific Goal (Individualized)  Outcome: Progressing  Goal: Absence of Hospital-Acquired Illness or Injury  Outcome: Progressing  Intervention: Identify and Manage Fall Risk  Recent Flowsheet Documentation  Taken 04/19/2021 0400 by Gwenlyn Saran, RN  Safety Interventions:   fall reduction program maintained   low bed  Taken 04/19/2021 0200 by Gwenlyn Saran, RN  Safety Interventions:   fall reduction program maintained   low bed  Taken 04/19/2021 0000 by Gwenlyn Saran, RN  Safety Interventions:   fall reduction program maintained   low bed  Taken 04/18/2021 2225 by Gwenlyn Saran, RN  Safety Interventions:   fall reduction program maintained   family at bedside   low bed  Intervention: Prevent and Manage VTE (Venous Thromboembolism) Risk  Recent Flowsheet Documentation  Taken 04/18/2021 2225 by Gwenlyn Saran, RN  Activity Management: ambulated to bathroom  Goal: Optimal Comfort and Wellbeing  Outcome: Progressing  Goal: Readiness for Transition of Care  Outcome: Progressing  Goal: Rounds/Family Conference  Outcome: Progressing     Problem: Self-Care Deficit  Goal: Improved Ability to Complete Activities of Daily Living  Outcome: Progressing     Problem: Fall Injury Risk  Goal: Absence of Fall and Fall-Related Injury  Outcome: Progressing  Intervention: Promote Injury-Free Environment  Recent Flowsheet Documentation  Taken 04/19/2021 0400 by Gwenlyn Saran, RN  Safety Interventions:   fall reduction program maintained   low bed  Taken 04/19/2021 0200 by Gwenlyn Saran, RN  Safety Interventions:   fall reduction program maintained   low bed  Taken 04/19/2021 0000 by Gwenlyn Saran, RN  Safety Interventions:   fall reduction program maintained   low bed  Taken 04/18/2021 2225 by Gwenlyn Saran, RN  Safety Interventions:   fall reduction program maintained   family at bedside   low bed     Problem: Perinatal Fall Injury Risk  Goal: Absence of Fall, Infant Drop and Related Injury  Outcome: Progressing  Intervention: Promote Injury-Free Environment  Recent Flowsheet Documentation  Taken 04/19/2021 0400 by Gwenlyn Saran, RN  Safety Interventions:   fall reduction program maintained   low bed  Taken 04/19/2021 0200 by Gwenlyn Saran, RN  Safety Interventions:   fall reduction program maintained   low bed  Taken 04/19/2021 0000 by Gwenlyn Saran, RN  Safety Interventions:   fall reduction program maintained   low bed  Taken 04/18/2021 2225 by Gwenlyn Saran, RN  Safety Interventions:   fall reduction program maintained   family at bedside   low bed     Problem: Adjustment to Illness (Heart Failure)  Goal: Optimal Coping  Outcome: Progressing     Problem: Cardiac Output Decreased (Heart Failure)  Goal: Optimal Cardiac Output  Outcome: Progressing     Problem: Dysrhythmia (Heart Failure)  Goal: Stable Heart Rate and Rhythm  Outcome: Progressing     Problem: Fluid Imbalance (Heart Failure)  Goal: Fluid Balance  Outcome: Progressing     Problem: Functional Ability Impaired (Heart Failure)  Goal: Optimal Functional Ability  Outcome: Progressing  Intervention: Optimize Functional Ability  Recent Flowsheet Documentation  Taken 04/18/2021 2225 by Gwenlyn Saran, RN  Activity Management: ambulated to bathroom     Problem: Oral Intake Inadequate (Heart Failure)  Goal: Optimal Nutrition Intake  Outcome: Progressing     Problem: Respiratory Compromise (Heart Failure)  Goal: Effective Oxygenation and  Ventilation  Outcome: Progressing     Problem: Sleep Disordered Breathing (Heart Failure)  Goal: Effective Breathing Pattern During Sleep  Outcome: Progressing

## 2021-04-19 NOTE — Unmapped (Addendum)
Barbara Huber is a 80 y.o. 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     Hospital course as detailed by problem below:    Sepsis most likely due to urinary source - UTI:  Patient presented with fever, chills, weakness, and feeling of incomplete bladder emptying found to be hypotensive with elevated lactate on arrival.  Labs were notable for a leukocytosis to 14.7 and UA with trace LE, negative nitrite, 50 WBC, and few bacteria, concerning for UTI. No growth on UCx/BCx collected on admission. UCx obtained outside showed >100000 gram negative rods. She was treated with Vanc and Zosyn in the ED prior to deescalating to IV ceftriaxone. She continued to clinically improve while admitted with resolution of presenting symptoms. She was sent home with a 7 day course cepodoxime 200 mg.      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 Her home torsemide 40 mg daily and spironolactone 50 mg daily were continued.     Concern for Polypharmacy  Patient presenting with recent history of generalized weakness in the setting of elevated creatinine.  Patient is on multiple sedating medications at home, including gabapentin 800 mg 3 times daily for peripheral neuropathy and OxyContin 10 mg twice daily.  Medication fill history also concerning for recent clonazepam use.  Creatinine clearance in setting of ongoing urosepsis as above indicative of supratherapeutic levels of gabapentin.  As a result, her gabapentin was gradually decreased to 300 TID to minimize polypharmacy contributing to generalized weakness.    Iron Deficiency Anemia:   Patient with known iron deficiency anemia with hemoglobin 8.5 and iron <20 on admission. Suspicion for GI bleed low. Patient received IV iron infusions x5 while admitted.     COPD on 3L at baseline:   Patient's symptoms and exam were not consistent with a COPD exacerbation. She was maintained on 3LNC, continued on her home Singulair, and given Encruse Ellipta for home Spiriva, Breo Ellipta for home Advair.     Why was I admitted?   You have been admitted with  confusion, weakness, feeling of incomplete bladder emptying and fatigue . This is caused by infection in your bladder . You were treated with antibiotics while admitted and will continue on antibiotics for 7 days at home.     What should I watch for in the next few weeks?  Watch out for any new or worsening urinary symptoms like painful urination, urinary frequency, urinary incontinence. Also watch out for any new or worsening fevers, chills, rashes, confusion, or fatigue.    Who will I follow up with?  We have scheduled you follow up with these doctors:  - Internal Medicine on 04/29/2021 at 1:40 PM  - Dr. Haze Justin, Pulmonology, on 05/23/2021 at 12:15 PM  - Gordan Payment, Juel Burrow, Urology on 06/23/2021 at 10:30 AM  - Dr. Marguerita Beards, Internal Medicine, on 07/10/2021 at 4:10 PM  - Dr. Carin Hock, Cardiology, on 08/29/2021 at 10:00 AM  Future Appointments   Date Time Provider Department Center   04/29/2021  1:40 PM [facility] FOLLOW-UP [facility]   05/23/2021 12:15 PM PFT 3 [facility]   05/23/2021 12:30 PM Zannie Cove, MD [facility]   06/23/2021 10:30 AM Nilda Simmer Marlis Edelson [place] [facility]   07/10/2021  4:10 PM Artelia Laroche,  MD [facility]   08/29/2021 10:00 AM Carin Hock, MD [facility]        When you see your primary doctor, please get these labs drawn:  - CBC, BMP, and Mg     Were there any changes in my advance care planning?    It is important to discuss healthcare issues with your primary care doctor at an upcoming visit, so that your doctor knows what is important to you and what you would or would not want done if you were to become very ill    What should I do if I start to feel sick again?  If the confusion, fatigue, or weakness worsens, we would recommend that you  reach out to your PCP or return to the ED.    In general, if you have specific questions about these instructions or your hospital stay in the next 48 hours, please call [phone] and ask for the geriatrics doctor on call. Let them know that you were just discharged from the hospital.     For new symptoms or problems, call your primary care provider.     If you believe that this is a life-threatening emergency, call 911 or go to the emergency room.       Thank you for allowing [place] to participate in your care.

## 2021-04-19 NOTE — Unmapped (Signed)
[name]    Assessment & Plan:   Barbara Huber is a 80 y.o. 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    Principal Problem:    Sepsis (CMS-HCC)  Active Problems:    COPD (chronic obstructive pulmonary disease) (CMS-HCC)    Sleep apnea in adult    Essential hypertension    Type 2 diabetes mellitus with stage 3b chronic kidney disease, without long-term current use of insulin (CMS-HCC)    Chronic cystitis    Peripheral neuropathy    Chronic respiratory failure with hypoxia (CMS-HCC)    Pulmonary hypertension (CMS-HCC)    Chronic prescription opiate use    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)    Cardiac amyloidosis (CMS-HCC)    Complicated UTI (urinary tract infection)  Resolved Problems:    * No resolved hospital problems. *    Asymptomatic Bacteruria - Sepsis most likely due to urinary source:   Presented with fever, chills, weakness, confusion. Hypotensive with elevated lactate on arrival. Leukocytosis to 14.7.  UA with trace LE, negative nitrite, 50 WBC, and few bacteria, concerning for infeciton. Patient symptomatic with feeling of incomplete emptying. History of UTIs. She was treated with Vanc and Zosyn in the ED prior to deescalating to ceftriaxone. Patient is improving clinically. She recently had UCx drawn with her urologist prior to presentation, will attempt to obtain those results.   - Continue IV Ceftriaxone 2g   - Follow UCx, BCx  - CTM for any urinary symptoms   [ ]  attempt to obtain prior UCx data to further deescalate abx  ??  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  Most recent echo 09/30/2020 w EF >55%.  Patient did receive 30 cc/kg bolus in the ED, will continue to assess volume status. Clinically euvolemic today.   - Continue home torsemide 40 mg daily  - Home spironolactone 50 mg daily  - Daily weights  - Strict I/Os  ??  Concern for Polypharmacy  Patient presenting with recent history of generalized weakness in the setting of elevated creatinine.  Patient is on multiple sedating medications at home, including gabapentin 800 mg 3 times daily for peripheral neuropathy and OxyContin 10 mg twice daily.  Medication fill history also concerning for recent clonazepam use.  Creatinine clearance in setting of ongoing urosepsis as above indicative of supratherapeutic levels of gabapentin.  As a result, will decrease dose of gabapentin to minimize polypharmacy contributing to generalized weakness.  -- Decrease gabapentin 800 mg 3 times daily to 600 mg 3 times daily, with plans for further decrease doses this admission  -- Continue OxyContin 10 mg twice daily for management of chronic pain  -- Continue to monitor mental status    Iron Deficiency Anemia: Patient with known iron deficiency anemia, with hemoglobin 8.5 on admission from 9.9 last month. No melena or suspected bleed on eliquis. Iron level remains low with hgb now 8.2. She has stopped taking PO iron supplementation. Outpatient iron infusion was recently denied by her insurance.   - Will start iron infusions   - Daily CBC    COPD on 3L at baseline: Exam not consistent with COPD exacerbation. Was able to be weaned back to home 3L today.   - Continue on 3LNC  - Encruse Ellipta for home Spiriva  - Breo Ellipta for  home Advair  - Continue home singulair    Chronic Problems:  Chronic Medical Conditions:   T2DM (not on insulin): Last A1c 5.5. SSI for home metformin while admitted  Chronic Pain: Home oxycontin 10 mg q12  OSA: Home CPAP  Afib: Cardioverted 01/09/2021. Continue home eliquis 5 mg BID, amiodarone 200 mg daily  Peripheral Neuropathy: Home gabapentin 800 mg 3 times daily decreased to 600 mg 3 times daily    Daily Checklist:  Diet: Regular Diet  DVT PPx: Patient Already on Full Anticoagulation with Eliquis  Electrolytes: No Repletion Needed  Code Status: Full Code  Dispo: Home    Team Contact Information:   Primary Team: Geriatrics ([facility])  Primary Resident: Loma Newton, MD  Resident's Pager: [phone] (Geriatrics Intern - Blue)    Interval History:   No acute events overnight. Patient feels better overall since admission. She denies dysuria, urinary frequency, or urgency but did note some feeling of incomplete emptying.     ROS: Denies headache, chest pain, shortness of breath, abdominal pain, nausea, vomiting.    Objective:   Temp:  [36.7 ??C (98.1 ??F)-37.8 ??C (100 ??F)] 37.6 ??C (99.7 ??F)  Heart Rate:  [68-80] 80  Resp:  [13-21] 19  BP: (104-149)/(53-66) 129/60  SpO2:  [90 %-95 %] 90 %    Gen: in NAD, answers questions appropriately  Eyes: sclera anicteric, EOMI  HENT: atraumatic, MMM, OP w/o erythema or exudate   Heart: RRR, S1, S2, no M/R/G, no chest wall tenderness, no appreciable JVP  Lungs: CTAB, no crackles or wheezes, no use of accessory muscles  Abdomen: Normoactive bowel sounds, soft, NTND, no rebound/guarding  Extremities: no clubbing, cyanosis, or edema in the BLEs  Psych: Alert, oriented, appropriate mood and affect    Labs/Studies: Labs and Studies from the last 24hrs per EMR and Reviewed    Ethelda Chick, MS4

## 2021-04-19 NOTE — Unmapped (Signed)
Pt is in for urospesis.  Pt continues to be free of falls. Pt is A*4 and able to call for help when needed. VSS.   Pt received all meds as prescribed.  MD at bedside for assessment.  Tafamidis 61mg  in not available at the hospital pharmacy. I spoke to pharmacist who agreed that the pt is aware that pt's daughter will bring this medication from the pt's regular pharmacy. Pt's daughter made aware and agrees with this plan. IV ron is finally approved by pharmacy, however, I am still waiting for the IV medication to administer it.  No significant events during the day shift. No complain of pain. WCM     Problem: Adult Inpatient Plan of Care  Goal: Plan of Care Review  Outcome: Progressing  Goal: Patient-Specific Goal (Individualized)  Outcome: Progressing  Goal: Absence of Hospital-Acquired Illness or Injury  Outcome: Progressing  Intervention: Identify and Manage Fall Risk  Recent Flowsheet Documentation  Taken 04/19/2021 1200 by Hassan Buckler, RN  Safety Interventions:   fall reduction program maintained   low bed  Taken 04/19/2021 0800 by Hassan Buckler, RN  Safety Interventions:   fall reduction program maintained   low bed  Intervention: Prevent and Manage VTE (Venous Thromboembolism) Risk  Recent Flowsheet Documentation  Taken 04/19/2021 0800 by Hassan Buckler, RN  Activity Management: activity adjusted per tolerance  Goal: Optimal Comfort and Wellbeing  Outcome: Progressing  Goal: Readiness for Transition of Care  Outcome: Progressing  Goal: Rounds/Family Conference  Outcome: Progressing     Problem: Self-Care Deficit  Goal: Improved Ability to Complete Activities of Daily Living  Outcome: Progressing     Problem: Fall Injury Risk  Goal: Absence of Fall and Fall-Related Injury  Outcome: Progressing  Intervention: Promote Injury-Free Environment  Recent Flowsheet Documentation  Taken 04/19/2021 1200 by Hassan Buckler, RN  Safety Interventions:   fall reduction program maintained   low bed  Taken 04/19/2021 0800 by Hassan Buckler, RN  Safety Interventions:   fall reduction program maintained   low bed     Problem: Perinatal Fall Injury Risk  Goal: Absence of Fall, Infant Drop and Related Injury  Outcome: Progressing  Intervention: Promote Injury-Free Environment  Recent Flowsheet Documentation  Taken 04/19/2021 1200 by Hassan Buckler, RN  Safety Interventions:   fall reduction program maintained   low bed  Taken 04/19/2021 0800 by Hassan Buckler, RN  Safety Interventions:   fall reduction program maintained   low bed     Problem: Adjustment to Illness (Heart Failure)  Goal: Optimal Coping  Outcome: Progressing     Problem: Cardiac Output Decreased (Heart Failure)  Goal: Optimal Cardiac Output  Outcome: Progressing     Problem: Dysrhythmia (Heart Failure)  Goal: Stable Heart Rate and Rhythm  Outcome: Progressing     Problem: Respiratory Compromise (Heart Failure)  Goal: Effective Oxygenation and Ventilation  Outcome: Progressing

## 2021-04-19 NOTE — Unmapped (Signed)
[facility] History and Physical    Assessment/Plan:    Principal Problem:    Sepsis (CMS-HCC)  Active Problems:    COPD (chronic obstructive pulmonary disease) (CMS-HCC)    Sleep apnea in adult    Essential hypertension    Type 2 diabetes mellitus with stage 3b chronic kidney disease, without long-term current use of insulin (CMS-HCC)    Chronic cystitis    Peripheral neuropathy    Chronic respiratory failure with hypoxia (CMS-HCC)    Pulmonary hypertension (CMS-HCC)    Chronic prescription opiate use    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)    Cardiac amyloidosis (CMS-HCC)    Complicated UTI (urinary tract infection)  Resolved Problems:    * No resolved hospital problems. *    Barbara Huber is a 80 y.o. female with PMHx of TTR cardiac amyloid, COPD on 3L O2 at home, OSA, pHTN, T2DM, [name]3 who presented to [facility] with weakness, fatigue, fever likely due to sepsis from urinary source.    Fever - Dysuria, Urgency - Sepsis most likely due to urinary source: Presents with fever, chills, weakness.  Hypotensive with elevated lactate on arrival.  Leukocytosis to 14.7.  UA concerning for infection.  Patient symptomatic with frequency, urgency, feeling of incomplete emptying.  History of UTIs.  Pan susceptible E. coli (02/27/21, 02/05/21, 01/21/21, 06/20/2018), MDR E. coli 01/03/2021, 09/28/2020, 08/03/2020), Klebsiella susceptible to CTX, 06/20/2018.  Although she has grown MDR E. coli in the past, her most recent infections have been susceptible to ceftriaxone.  She was treated with Vanco and Zosyn in the ED which her previous MDR bugs have not been susceptible to.  On my exam she is feeling much better, will narrow antibiotics to ceftriaxone although low threshold to rebroaden.  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  Most recent echo 09/30/2020 w EF >55%.  Patient did receive 30 cc/kg bolus in the ED, will continue asked to assess volume status as she may need diuresis in the coming days.  On exam today, no crackles, no lower extremity edema, warm extremities.  - Continue home torsemide 40 mg daily  - Home spironolactone 50 mg daily  - Daily weights  - Strict I/Os    Iron Deficiency Anemia: Patient with known iron deficiency anemia, today hemoglobin 8.5 from 9.9 last month. No melena, suspected bleed on eliquis. Iron level remains low. She has stopped taking PO iron supplementation. Outpatient iron infusion was recently denied by her insurance. This is being ordered by her cardiologist.   - Consider IV iron repletion while in patient  - Add on ferritin    COPD on 3L at baseline: On my evaluation patient on 4 L rather than home 3 L, no wheeze, no increased sputum production concerning for COPD exacerbation.  I anticipate that she will be able to wean back to 3 L by tomorrow  - Wean to home 4L as able  -If having difficulty weaning patient would likely benefit from diuresis after receiving 30 cc/kg bolus  - [place] Ellipta for home Spiriva  - Breo Ellipta for home Advair  - Continue home singulair    Chronic Medical Conditions:   T2DM (not on insulin): SSI for home metformin while admitted  Peripheral Neuropathy: home gabapentin 800 mg TID  Chronic Pain: Home oxycontin 10 mg q12  OSA: Home CPAP  Afib: Cardioverted 01/09/2021. Continue home eliquis 5 mg BID, amiodarone 200 mg daily    Cognitive assessment:  CAM (Confusion Assessment Method): Negative for evidence of delirium    Other cognitive assessment: SLUMS 21/30 5/21    FEN:  IVF: None  Diet: Reg diet    Prophylaxis:  - DVT:Pt already on theraputic anticoagulation    Advance Care Planning & Code Status:  Full Code    Surrogate decision maker:   Daughter-- Barbara Huber    Disposition:  Floor    Emergency Contact:  Extended Emergency Contact Information  Primary Emergency Contact: Barbara Huber  Address: [address]           [place], [place] [address] [place]  Home Phone: [phone]  Work Phone: [phone]  Mobile Phone: [phone]  Relation: Daughter    ___________________________________________________________________    Chief Complaint:  Chief Complaint   Patient presents with   ??? Possible UTI     Sepsis (CMS-HCC)    PCP: Barbara Doe, MD    HPI:  Barbara Huber is a 80 y.o. female with PMHx of TTR cardiac amyloid, COPD on 3L O2 at home, OSA, pHTN, T2DM, [name]3 who presented to [facility] with weakness, fatigue, fever likely due to sepsis from urinary source. She reports that this morning she felt out of it she was dizzy and weak and noted to have a fever (101F) at home.  She was seen by her urologist (Barbara Huber at [facility]) on Wednesday and was given Augmentin but told not to take it until the cultures resulted.  She started taking it on the day before presentation.  She was having symptoms including urgency, frequency and a feeling of incomplete emptying.  Patient has frequent UTIs often requiring hospitalization and so daughter brought her in to the hospital.    On arrival to the ED she was hypotensive to 96/40, white blood cell count 14.7, lactate 2.0, she was given sepsis fluids and Vanc and Zosyn.  Blood pressures improved and lactate fell to 1.1.  On my evaluation, patient stated she is feeling much better and more like herself.    Allergies:  Nitrofurantoin and Lipitor [atorvastatin]    Medications:   Prior to Admission medications    Medication Dose, Route, Frequency   ACCU-CHEK AVIVA PLUS TEST STRP Strp USE TO CHECK BLOOD SUGAR 3 TIMES A DAY BEFORE MEALS   acetaminophen (TYLENOL) 500 MG tablet 1,000 mg, Oral, Daily PRN   albuterol HFA 90 mcg/actuation inhaler 2 puffs, Inhalation, Every 8 hours PRN   amiodarone (PACERONE) 200 MG tablet 200 mg, Oral, Daily (standard)   apixaban (ELIQUIS) 5 mg Tab 5 mg, Oral, 2 times a day (standard)   estradioL (ESTRACE) 0.01 % (0.1 mg/gram) vaginal cream INSERT ONE GRAM VAGINALLY AT BEDTIME   ferrous sulfate 325 (65 FE) MG tablet 325 mg, Oral, Every Mon-Wed-Fri   fluticasone propion-salmeteroL (ADVAIR HFA) 115-21 mcg/actuation inhaler 2 puffs, Inhalation, 2 times a day (standard)   gabapentin (NEURONTIN) 300 MG capsule 600 mg, Oral, 3 times a day (standard)   lancets Misc 1 each, Miscellaneous, Daily (standard), Accu Check   metFORMIN (GLUCOPHAGE) 500 MG tablet 500 mg, Oral, 2 times a day with meals   montelukast (SINGULAIR) 10 mg tablet 10 mg, Oral, Daily   NARCAN 4 mg/actuation nasal spray 1 spray, Once as needed  Patient not taking: Reported on 02/04/2021  oxyCODONE (OXYCONTIN) 10 mg TR12 12 hr crush resistant ER/CR tablet 10 mg, Oral, Every 12 hours   OXYGEN-AIR DELIVERY SYSTEMS MISC 5 L, Miscellaneous   rosuvastatin (CRESTOR) 5 MG tablet 5 mg, Oral, Every other day   spironolactone (ALDACTONE) 50 MG tablet 50 mg, Oral, Daily (standard)   tafamidis 61 mg cap Take 1 capsule (61 mg) by mouth daily.   tiotropium bromide (SPIRIVA RESPIMAT) 2.5 mcg/actuation inhalation mist 2 puffs, Inhalation, Daily (standard)   torsemide (DEMADEX) 20 MG tablet Take 2 tablets (40 mg total) by mouth daily. May also take 2 tablets (40 mg total) daily as needed (weight gain).   varicella-zoster [id number], PF, (SHINGRIX, PF,) 50 mcg/0.5 mL SusR injection 0.5 mL, Intramuscular       Medical History:  Past Medical History:   Diagnosis Date   ??? Acute kidney injury superimposed on chronic kidney disease (CMS-HCC) 10/11/2020   ??? Acute on chronic diastolic (congestive) heart failure (CMS-HCC) 08/23/2020   ??? Arthritis    ??? Calculus of kidney    ??? Calculus of ureter    ??? CHF (congestive heart failure) (CMS-HCC)    ??? Chronic atrial fibrillation (CMS-HCC) 07/20/2019   ??? COPD (chronic obstructive pulmonary disease) (CMS-HCC)    ??? Diabetes (CMS-HCC)    ??? Gangrenous cholecystitis 10/11/2020   ??? GERD (gastroesophageal reflux disease) ??? Hydronephrosis    ??? Hypertension    ??? Hyponatremia 10/11/2020   ??? Intermediate coronary syndrome (CMS-HCC) 03/13/2014   ??? Lower extremity edema 09/28/2020   ??? Lumbar stenosis    ??? Microscopic hematuria    ??? Nausea alone    ??? Neuropathy    ??? Nocturia    ??? Other chronic cystitis    ??? Pulmonary hypertension (CMS-HCC)    ??? Renal colic    ??? Sleep apnea    ??? Unstable angina pectoris (CMS-HCC) 03/13/2014       Surgical History:  Past Surgical History:   Procedure Laterality Date   ??? BACK SURGERY  1995   ??? CARPAL TUNNEL RELEASE Left 2014   ??? HYSTERECTOMY  1971   ??? IR INSERT CHOLECYSTOSMY TUBE PERCUTANEOUS  10/02/2020    IR INSERT CHOLECYSTOSMY TUBE PERCUTANEOUS 10/02/2020 Braulio Conte, MD IMG VIR [facility]   ??? LUMBAR DISC SURGERY     ??? PR REMOVAL GALLBLADDER N/A 10/06/2020    Procedure: CHOLECYSTECTOMY;  Surgeon: Katherina Mires, MD;  Location: MAIN OR [facility];  Service: Trauma   ??? PR RIGHT HEART CATH O2 SATURATION & CARDIAC OUTPUT N/A 09/30/2020    Procedure: Right Heart Catheterization;  Surgeon: Neal Dy, MD;  Location: [facility] CATH;  Service: Cardiology   ??? PR RIGHT HEART CATH O2 SATURATION & CARDIAC OUTPUT N/A 01/09/2021    Procedure: Right Heart Catheterization;  Surgeon: Lesle Reek, MD;  Location: [facility] CATH;  Service: Cardiology       Social History:  Social History     Socioeconomic History   ??? Marital status: Widowed     Spouse name: Not on file   ??? Number of children: Not on file   ??? Years of education: Not on file   ??? Highest education level: Not on file   Occupational History   ??? Occupation: retired   Tobacco Use   ??? Smoking status: Former Smoker     Packs/day: 0.33     Types: Cigarettes   ??? Smokeless tobacco: Never Used   ??? Tobacco comment: Quit a few years ago   Vaping Use   ???  Vaping Use: Never used   Substance and Sexual Activity   ??? Alcohol use: No   ??? Drug use: No   ??? Sexual activity: Not on file   Other Topics Concern   ??? Exercise No   ??? Living Situation Not Asked   Social History Narrative    Previously worked in Designer, fashion/clothing, retired.  Daughter is Economist who works at [employer].  Her niece is Selena Batten, [employer] Anesthesia Tech.  Lives in [place] with daughter and son-in-law.     Social Determinants of Health     Financial Resource Strain: Low Risk    ??? Difficulty of Paying Living Expenses: Not very hard   Food Insecurity: No Food Insecurity   ??? Worried About Running Out of Food in the Last Year: Never true   ??? Ran Out of Food in the Last Year: Never true   Transportation Needs: No Transportation Needs   ??? Lack of Transportation (Medical): No   ??? Lack of Transportation (Non-Medical): No   Physical Activity: Not on file   Stress: Not on file   Social Connections: Not on file       Living situation:   Patient lives in a house with daughter and husband in [place]    ADLs:  Feeding: Independent  Dressing: Independent  Ambulation: Requires Assistance  Toileting: Independent  Bathing: Independent    IADLs:  Using the telephone: Independent  Shopping: Dependent  Meal preparation: Requires Assistance - shares this task with daughter.   Medication management: Independent  Managing finances: Dependent  Housework: Requires Presenter, broadcasting (driving or navigating public transit): Dependent     Assistive devices: walker.     Family History:  Family History   Problem Relation Age of Onset   ??? Hypertension Mother    ??? Cancer Father         COLON CANCER   ??? Anesthesia problems Neg Hx    ??? Broken bones Neg Hx    ??? Clotting disorder Neg Hx    ??? Collagen disease Neg Hx    ??? Diabetes Neg Hx    ??? Dislocations Neg Hx    ??? Fibromyalgia Neg Hx    ??? Gout Neg Hx    ??? Hemophilia Neg Hx    ??? Osteoporosis Neg Hx    ??? Rheumatologic disease Neg Hx    ??? Scoliosis Neg Hx    ??? Severe sprains Neg Hx    ??? Sickle cell anemia Neg Hx    ??? Spinal Compression Fracture Neg Hx    ??? GU problems Neg Hx    ??? Kidney cancer Neg Hx    ??? Prostate cancer Neg Hx        Review of Systems:  10 systems reviewed and are negative unless otherwise mentioned in HPI    Labs/Studies:  Labs and Studies from the last 24hrs per EMR and Reviewed    Physical Exam:  Temp:  [36.7 ??C-37.8 ??C] 37.8 ??C  Heart Rate:  [68-79] 79  SpO2 Pulse:  [77] 77  Resp:  [13-24] 20  BP: (96-149)/(40-66) 149/66  SpO2:  [86 %-95 %] 95 %    GEN: NAD, laying in bed wearing her own pajamas, engaged.   EYES: EOMI  ENT: MMM  CV: RRR, no murmurs appreciated  PULM: CTAB, breathing comfortably on 4L [place]  ABD: soft, NT/ND, +BS, no suprapubic tenderness  EXT: No edema  NEURO: No focal deficits  PSYCH: A+Ox3, appropriate  GU: No CVA tenderness  MSK: No spinal tenderness

## 2021-04-19 NOTE — Unmapped (Signed)
ISAR Screening- for all patients 65+ community-dwelling patients    1. Before the illness or injury that brought you to the Emergency Department, did you need someone to help you on a regular basis?YES  2. Since the illness or injury that brought you to the Emergency Department, have you needed more help than usual to take care of yourself?YES  3. Have you been hospitalized for one or more nights during the past six months (excluding a stay in the Emergency Department)?YES  4. In general, do you see well?YES  5. In general, do you have serious problems with your memory?NO  6. Do you take more than five different prescribed medications every day?YES    A score of 3 or more will require a Case Management consultation. Primary RN notified of score of 3 or more and CM paged for assessment.  Total Score: 4

## 2021-04-19 NOTE — Unmapped (Addendum)
Care Management  Initial Transition Planning Assessment    Patient's discharge address and phone number verified as correct in demographics. Number home levels 1 and entrance steps 2 wit rt handrail. Pt lives with daughter and SIL. Current HH is setup in the home: No. Current DME in home:  None. Daughter is this patient's Insurance risk surveyor. Pt will be picked up at discharge by Daughter. Daughter will take time off of work if she needs to after pt's discharge to care for her.  Pt using O2 @3L /min via nasal cannula. Has trilogy for home use from [place].      CM met with patient in pt room.  Pt/visitors were not wearing hospital provided masks for the duration of the interaction with CM.   CM was wearing hospital provided surgical mask.  CM was within 6 foot of the patient/visitors during this interaction.      Type of Residence: Mailing Address:  [address] [place] [address]  Contacts: Accompanied by: Family member  Patient Phone Number: [phone]        Medical Provider(s): Jacquiline Doe, MD  Reason for Admission: Admitting Diagnosis:  No admission diagnoses are documented for this encounter.  Past Medical History:   has a past medical history of Acute kidney injury superimposed on chronic kidney disease (CMS-HCC) (10/11/2020), Acute on chronic diastolic (congestive) heart failure (CMS-HCC) (08/23/2020), Arthritis, Calculus of kidney, Calculus of ureter, CHF (congestive heart failure) (CMS-HCC), Chronic atrial fibrillation (CMS-HCC) (07/20/2019), COPD (chronic obstructive pulmonary disease) (CMS-HCC), Diabetes (CMS-HCC), Gangrenous cholecystitis (10/11/2020), GERD (gastroesophageal reflux disease), Hydronephrosis, Hypertension, Hyponatremia (10/11/2020), Intermediate coronary syndrome (CMS-HCC) (03/13/2014), Lower extremity edema (09/28/2020), Lumbar stenosis, Microscopic hematuria, Nausea alone, Neuropathy, Nocturia, Other chronic cystitis, Pulmonary hypertension (CMS-HCC), Renal colic, Sleep apnea, and Unstable angina pectoris (CMS-HCC) (03/13/2014).  Past Surgical History:   has a past surgical history that includes Carpal tunnel release (Left, 2014); Hysterectomy (1971); Back surgery (1995); Lumbar disc surgery; IR Insert Cholecystosmy Tube Percutaneous (10/02/2020); pr removal gallbladder (N/A, 10/06/2020); pr right heart cath o2 saturation & cardiac output (N/A, 09/30/2020); and pr right heart cath o2 saturation & cardiac output (N/A, 01/09/2021).   Previous admit date: 01/21/2021    Primary Insurance- Payor: HUMANA MEDICARE ADV / Plan: HUMANA GOLD PLUS HMO / Product Type: *No Product type* /   Secondary Insurance ??? Secondary Insurance  MEDICAID [place]  Prescription Coverage ??? yes  Preferred Pharmacy - [facility] [place] [address]  [facility] [place] - [address]  [place] [facility]    Transportation home: Acupuncturist assessed the patient by : In person interview with patient, Medical record review, Discussion with Clinical Care team  Orientation Level: Oriented X4  Functional level prior to admission: Partially Assisted  Reason for referral: Discharge Planning    Contact/Decision Maker  Extended Emergency Contact Information  Primary Emergency Contact: [name],[name]  Address: [address]           [place], [place] [address] [place]  Home Phone: [phone]  Work Phone: [phone]  Mobile Phone: [phone]  Relation: Daughter    Legal Next of Kin / Guardian / POA / Advance Directives     HCDM (patient stated preference): [name],[name] - Daughter - [phone]    Advance Directive (  Medical Treatment)  Does patient have an advance directive covering medical treatment?: Patient does not have advance directive covering medical treatment.  Reason patient does not have an advance directive covering medical treatment:: Patient does not wish to complete one at this time.    Health Care Decision Maker [HCDM] (Medical & Mental Health Treatment)  Healthcare Decision Maker: Patient does not wish to appoint a Health Care Decision Maker at this time  Information offered on HCDM, Medical & Mental Health advance directives:: Patient declined information.         Patient Information  Lives with: Children, Family members    Type of Residence: Private residence        Location/Detail: [address] [place], [place] [address]/2 level home with 2 entrance steps and rt handrail    Support Systems/Concerns: Children, Family Members    Responsibilities/Dependents at home?: No    Home Care services in place prior to admission?: No                  Equipment Currently Used at Home: walker, rolling, cane, straight (Rollator)       Currently receiving outpatient dialysis?: No       Financial Information       Need for financial assistance?: No       Social Determinants of Health  Social Determinants of Health were addressed in provider documentation.  Please refer to patient history.  Social Determinants of Health     Tobacco Use: Medium Risk   ??? Smoking Tobacco Use: Former Smoker   ??? Smokeless Tobacco Use: Never Used   Alcohol Use: Not At Risk   ??? How often do you have a drink containing alcohol?: Never   ??? How many drinks containing alcohol do you have on a typical day when you are drinking?: 1 - 2   ??? How often do you have 5 or more drinks on one occasion?: Never   Financial Resource Strain: Low Risk    ??? Difficulty of Paying Living Expenses: Not very hard   Food Insecurity: No Food Insecurity   ??? Worried About Running Out of Food in the Last Year: Never true   ??? Ran Out of Food in the Last Year: Never true   Transportation Needs: No Transportation Needs   ??? Lack of Transportation (Medical): No   ??? Lack of Transportation (Non-Medical): No   Physical Activity: Not on file   Stress: Not on file   Social Connections: Not on file   Intimate Partner Violence: Not on file   Depression: Not at risk ??? PHQ-2 Score: 1   Housing/Utilities: Low Risk    ??? Within the past 12 months, have you ever stayed: outside, in a car, in a tent, in an overnight shelter, or temporarily in someone else's home (i.e. couch-surfing)?: No   ??? Are you worried about losing your housing?: No   ??? Within the past 12 months, have you been unable to get utilities (heat, electricity) when it was really needed?: No   Substance Use: Low Risk    ??? Taken prescription drugs for non-medical reasons: Never   ??? Taken illegal drugs: Never   ??? Patient indicated they have taken drugs in the past year for non-medical reasons: Yes, [positive answer(s)]: Not on file   Health Literacy: Medium Risk   ??? : Sometimes       Discharge Needs Assessment  Concerns to be Addressed: discharge planning, coping/stress    Clinical Risk  Factors: > 65, New Diagnosis, Multiple Diagnoses (Chronic), Functional Limitations    Barriers to taking medications: No                   Anticipated Changes Related to Illness: inability to care for self    Equipment Needed After Discharge: other (see comments) (TBD)    Discharge Facility/Level of Care Needs:      Readmission  Risk of Unplanned Readmission Score: UNPLANNED READMISSION SCORE: 38%  Predictive Model Details          38% (High)  Factor Value    Calculated 04/19/2021 12:03 25% Number of ED visits in last six months 7    [facility] Risk of Unplanned Readmission Model 15% Number of active Rx orders 34     10% Number of hospitalizations in last year 3     6% ECG/EKG order present in last 6 months     6% Latest calcium low (8.1 mg/dL)     5% Latest BUN high (32 mg/dL)     5% Encounter of ten days or longer in last year present     4% Imaging order present in last 6 months     4% Age 13     4% Latest hemoglobin low (8.2 g/dL)     3% Charlson Comorbidity Index 4     3% Diagnosis of deficiency anemia present     3% Active anticoagulant Rx order present     3% Latest creatinine high (1.65 mg/dL)     3% Diagnosis of renal failure present 1% Future appointment scheduled     0% Current length of stay 0.641 days      Readmitted Within the Last 30 Days? (No if blank)   Patient at risk for readmission?: Yes    Discharge Plan  Screen findings are: Discharge planning needs identified or anticipated (Comment).    Expected Discharge Date:     Expected Transfer from Critical Care:      Quality data for continuing care services shared with patient and/or representative?: Yes  Patient and/or family were provided with choice of facilities / services that are available and appropriate to meet post hospital care needs?: Yes       Initial Assessment complete?: Yes

## 2021-04-20 LAB — CBC
HEMATOCRIT: 25.7 % — ABNORMAL LOW (ref 34.0–44.0)
HEMOGLOBIN: 7.9 g/dL — ABNORMAL LOW (ref 11.3–14.9)
MEAN CORPUSCULAR HEMOGLOBIN CONC: 30.9 g/dL — ABNORMAL LOW (ref 32.0–36.0)
MEAN CORPUSCULAR HEMOGLOBIN: 26.4 pg (ref 25.9–32.4)
MEAN CORPUSCULAR VOLUME: 85.2 fL (ref 77.6–95.7)
MEAN PLATELET VOLUME: 8.6 fL (ref 6.8–10.7)
PLATELET COUNT: 208 10*9/L (ref 150–450)
RED BLOOD CELL COUNT: 3.01 10*12/L — ABNORMAL LOW (ref 3.95–5.13)
RED CELL DISTRIBUTION WIDTH: 16.9 % — ABNORMAL HIGH (ref 12.2–15.2)
WBC ADJUSTED: 11.8 10*9/L — ABNORMAL HIGH (ref 3.6–11.2)

## 2021-04-20 LAB — BASIC METABOLIC PANEL
ANION GAP: 6 mmol/L (ref 5–14)
BLOOD UREA NITROGEN: 35 mg/dL — ABNORMAL HIGH (ref 9–23)
BUN / CREAT RATIO: 20
CALCIUM: 8.2 mg/dL — ABNORMAL LOW (ref 8.7–10.4)
CHLORIDE: 103 mmol/L (ref 98–107)
CO2: 29.1 mmol/L (ref 20.0–31.0)
CREATININE: 1.75 mg/dL — ABNORMAL HIGH
EGFR CKD-EPI AA FEMALE: 31 mL/min/{1.73_m2} — ABNORMAL LOW (ref >=60)
EGFR CKD-EPI NON-AA FEMALE: 27 mL/min/{1.73_m2} — ABNORMAL LOW (ref >=60)
GLUCOSE RANDOM: 143 mg/dL (ref 70–179)
POTASSIUM: 4.2 mmol/L (ref 3.4–4.8)
SODIUM: 138 mmol/L (ref 135–145)

## 2021-04-20 LAB — MAGNESIUM: MAGNESIUM: 2.6 mg/dL (ref 1.6–2.6)

## 2021-04-20 MED ADMIN — fluticasone furoate-vilanteroL (BREO ELLIPTA) 100-25 mcg/dose inhaler 1 puff: 1 | RESPIRATORY_TRACT | @ 12:00:00

## 2021-04-20 MED ADMIN — sodium ferric gluconate (FERRLECIT) 125 mg in sodium chloride (NS) 0.9 % 100 mL IVPB: 125 mg | INTRAVENOUS | @ 14:00:00 | Stop: 2021-04-21

## 2021-04-20 MED ADMIN — montelukast (SINGULAIR) tablet 10 mg: 10.0 mg | ORAL | @ 01:00:00

## 2021-04-20 MED ADMIN — gabapentin (NEURONTIN) capsule 600 mg: 600 mg | ORAL | @ 12:00:00 | Stop: 2021-04-20

## 2021-04-20 MED ADMIN — umeclidinium (INCRUSE ELLIPTA) 62.5 mcg/actuation inhaler 1 puff: 1 | RESPIRATORY_TRACT | @ 12:00:00

## 2021-04-20 MED ADMIN — amiodarone (PACERONE) tablet 200 mg: 200 mg | ORAL | @ 12:00:00

## 2021-04-20 MED ADMIN — insulin lispro (HumaLOG) injection 0-12 Units: 0-12 [IU] | SUBCUTANEOUS | @ 01:00:00

## 2021-04-20 MED ADMIN — gabapentin (NEURONTIN) capsule 600 mg: 600 mg | ORAL | @ 01:00:00

## 2021-04-20 MED ADMIN — torsemide (DEMADEX) tablet 40 mg: 40 mg | ORAL | @ 12:00:00

## 2021-04-20 MED ADMIN — apixaban (ELIQUIS) tablet 5 mg: 5 mg | ORAL | @ 12:00:00

## 2021-04-20 MED ADMIN — valACYclovir (VALTREX) tablet 500 mg: 500 mg | ORAL | @ 01:00:00 | Stop: 2021-04-22

## 2021-04-20 MED ADMIN — oxyCODONE (OxyCONTIN) 12 hr crush resistant ER/CR tablet 10 mg: 10 mg | ORAL | @ 01:00:00 | Stop: 2021-05-02

## 2021-04-20 MED ADMIN — apixaban (ELIQUIS) tablet 5 mg: 5 mg | ORAL | @ 01:00:00

## 2021-04-20 MED ADMIN — gabapentin (NEURONTIN) capsule 300 mg: 300 mg | ORAL | @ 19:00:00

## 2021-04-20 MED ADMIN — cefTRIAXone (ROCEPHIN) 2 g in sodium chloride 0.9 % (NS) 100 mL IVPB-connector bag: 2 g | INTRAVENOUS | @ 01:00:00 | Stop: 2021-04-25

## 2021-04-20 MED ADMIN — oxyCODONE (OxyCONTIN) 12 hr crush resistant ER/CR tablet 10 mg: 10 mg | ORAL | @ 12:00:00 | Stop: 2021-05-02

## 2021-04-20 MED ADMIN — tafamidis cap 61 mg: 61 mg | ORAL | @ 12:00:00

## 2021-04-20 MED ADMIN — valACYclovir (VALTREX) tablet 500 mg: 500 mg | ORAL | @ 12:00:00 | Stop: 2021-04-22

## 2021-04-20 MED ADMIN — sodium ferric gluconate (FERRLECIT) 125 mg in sodium chloride (NS) 0.9 % 100 mL IVPB: 125 mg | INTRAVENOUS | @ 02:00:00 | Stop: 2021-04-21

## 2021-04-20 MED ADMIN — promethazine (PHENERGAN) tablet 12.5 mg: 12.5 mg | ORAL | @ 01:00:00 | Stop: 2021-04-19

## 2021-04-20 MED ADMIN — spironolactone (ALDACTONE) tablet 50 mg: 50 mg | ORAL | @ 12:00:00

## 2021-04-20 NOTE — Unmapped (Signed)
[name]    Assessment & Plan:   Barbara Huber is a 80 y.o. 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    Principal Problem:    Sepsis (CMS-HCC)  Active Problems:    COPD (chronic obstructive pulmonary disease) (CMS-HCC)    Sleep apnea in adult    Essential hypertension    Type 2 diabetes mellitus with stage 3b chronic kidney disease, without long-term current use of insulin (CMS-HCC)    Chronic cystitis    Peripheral neuropathy    Chronic respiratory failure with hypoxia (CMS-HCC)    Pulmonary hypertension (CMS-HCC)    Chronic prescription opiate use    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)    Cardiac amyloidosis (CMS-HCC)    Complicated UTI (urinary tract infection)  Resolved Problems:    * No resolved hospital problems. *    Sepsis most likely due to urinary source - UTI:   Presented with fever, chills, weakness, confusion. Hypotensive with elevated lactate on arrival. Leukocytosis to 14.7.  UA with trace LE, negative nitrite, 50 WBC, and few bacteria, concerning for infeciton. Patient symptomatic with feeling of incomplete emptying. History of UTIs. She was treated with Vanc and Zosyn in the ED prior to deescalating to ceftriaxone. No growth on UCx/BCx collected on admission. She recently had UCx drawn with her urologist prior to presentation, will attempt to obtain those results. Patient continues to improve clinically.  - Continue IV Ceftriaxone 1g with plan to deescalate to oral cefpodoxime prior to discharge  - CTM for any urinary symptoms   [ ]  attempt to obtain prior UCx data to guide deescalatation of abx  ??  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  Most recent echo 09/30/2020 w EF >55%.  Patient did receive 30 cc/kg bolus in the ED, will continue to assess volume status. Clinically euvolemic today.   - Continue home torsemide 40 mg daily  - Home spironolactone 50 mg daily  - Daily weights  - Strict I/Os  ??  Concern for Polypharmacy  Patient presenting with recent history of generalized weakness in the setting of elevated creatinine.  Patient is on multiple sedating medications at home, including gabapentin 800 mg 3 times daily for peripheral neuropathy and OxyContin 10 mg twice daily.  Medication fill history also concerning for recent clonazepam use.  Creatinine clearance in setting of ongoing urosepsis as above indicative of supratherapeutic levels of gabapentin.  As a result, will decrease dose of gabapentin to minimize polypharmacy contributing to generalized weakness.  -- Decrease gabapentin 600 mg TID to 300 mg BID, 600 mg nightly with plans for further decrease doses this admission  -- Continue OxyContin 10 mg twice daily for management of chronic pain  -- Continue to monitor mental status    Iron Deficiency Anemia: Patient with known iron deficiency anemia, with hemoglobin 8.5 on admission from 9.9 last month. No melena or suspected bleed on eliquis. Iron level remains low with hgb now 8.2. She has stopped taking PO iron supplementation. Outpatient iron infusion was recently denied by her insurance.   - Continue iron infusions   - Daily CBC    COPD on 3L at baseline: Exam not consistent with COPD exacerbation.   - Continue on 3LNC  - Incruse Ellipta for home Spiriva  - Breo Ellipta for home  Advair  - Continue home singulair    Chronic Problems:  Chronic Medical Conditions:   T2DM (not on insulin): Last A1c 5.5. Discontinue SSI  Chronic Pain: Home oxycontin 10 mg q12  OSA: Home CPAP  Afib: Cardioverted 01/09/2021. Continue home eliquis 5 mg BID, amiodarone 200 mg daily  Peripheral Neuropathy: Home gabapentin 800 mg 3 times daily decreased to 300 BID, 600 QHS    Daily Checklist:  Diet: Regular Diet  DVT PPx: Patient Already on Full Anticoagulation with Eliquis  Electrolytes: No Repletion Needed  Code Status: Full Code  Dispo: Home    Team Contact Information:   Primary Team: Geriatrics (MEDA)  Primary Resident: Loma Newton, MD  Resident's Pager: [phone] (Geriatrics Intern - Blue)    Interval History:   No acute events overnight. No acute events overnight. No new concerns this AM. Tolerated decrease in gabapentin dose without any adverse effects. Notes improvement in generalized weakness this AM. Also reports resolution of prior incomplete bladder emptying.      ROS: Denies headache, chest pain, shortness of breath, abdominal pain, nausea, vomiting.    Objective:   Temp:  [35.9 ??C (96.6 ??F)-37 ??C (98.6 ??F)] 37 ??C (98.6 ??F)  Heart Rate:  [70-85] 72  Resp:  [17-20] 20  BP: (110-118)/(53-69) 110/53  SpO2:  [87 %-93 %] 87 %    Gen: in NAD, answers questions appropriately   Eyes: sclera anicteric, EOMI  HENT: atraumatic, MMM, OP w/o erythema or exudate   Heart: RRR, S1, S2, no M/R/G, no chest wall tenderness, JVP exam limited by body habitus  Lungs: CTAB, no crackles or wheezes, no use of accessory muscles  Abdomen: Normoactive bowel sounds, soft, NTND, no rebound/guarding  Extremities: no clubbing, cyanosis, trace pitting edema in LE bilaterally  Psych: Alert, oriented, appropriate mood and affect    Labs/Studies: Labs and Studies from the last 24hrs per EMR and Reviewed    Ethelda Chick, MS4    I attest that I have reviewed the student note and that the components of the history of the present illness, the physical exam, and the assessment and plan documented were performed by me or were performed in my presence by the student where I verified the documentation and performed (or re-performed) the exam and medical decision making.      Loma Newton, MD  PGY-1 - Internal  Medicine

## 2021-04-20 NOTE — Unmapped (Signed)
Patient remoned home CPAP but forget to placed Nasal cannula on. Desaturation occurred into the 50's. Nasal Cannula applied and placed on 4 lpm ; saturations increased to 90's . Breathing treatments given and tolerated well.  RT, RN and CNA at the bedside. RT will continue to monitor and follow as needed.

## 2021-04-20 NOTE — Unmapped (Signed)
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 Pt has decreased endurance.    Assessment  Problem List: Decreased endurance, Impaired balance, Fall Risk, Impaired ADLs  Assessment: 80 yo femalw with a PMHX of TTR cardiac amyloid, COPD on 3L O2 home,OSA , pHTN, T2DM, [name]3 who presented to [facility] with weakness, fatigue, fever likely due to sepsis from urinary sources. Pt and dtr were educated on the role of OT in the acute care setting. Pt was sitting on the EOB. Pt able to donn her bedroom shoes with mod I. Pt completed sit to stand with mod I using RW. Pt transferred from EOB to toilet with mod I using RW. Pt completed toilet hygiene with mod I using RW. Pt transferred from toilet to sink with mod I using RW. Pt stood at sink and washed her hands with mod I using RW. Pt ambulated from bathroom to EOB with mod I using RW. Pt stated she was weak. Pt required rest breaks . Pt demonstrated a decline in endurance. Pt has an AM-PAC score of 23/24 and deficits of 15.86% in self care. Pt is a low complexity pt for OT 2/2 clinical judgment and functional level. Dispo: no f/u OT. Discharge OT.  Today's Interventions: ADL retraining, Balance activities, Bed mobility, Education - Patient, Education - Family / caregiver, Endurance activities, Functional cognition, Functional mobility, Range of motion, Transfer training, UE Strength / coordination exercise, Other  Today's Interventions: self care, balance, bed mobility, pt education, family education, staff education, endurance, functional cognition, functional mobility, ROM, transfer, strength, AM-PAC 23/24    Activity Tolerance During Today's Session  Limited by fatigue    Plan  Planned Frequency of Treatment:  D/C Services for: D/C Services       Planned Interventions:       Post-Discharge Occupational Therapy Recommendations:   OT services not indicated     -        GOALS:   Patient and Family Goals: home when ready            Short Term:         Prognosis:     Positive Indicators:     Barriers to Discharge: Endurance deficits    Subjective  Current Status Pt was sitting on the EOB.  Prior Functional Status Pt was I with self care, lite with IADL's, uses rollator to walk. Pt is on 3 liters of O2 at home.       Services patient receives: OT    Patient / Caregiver reports: Charity fundraiser and pt agreed to OT.    Past Medical History:   Diagnosis Date   ??? Acute kidney injury superimposed on chronic kidney disease (CMS-HCC) 10/11/2020   ??? Acute on chronic diastolic (congestive) heart failure (CMS-HCC) 08/23/2020   ??? Arthritis    ??? Calculus of kidney    ??? Calculus of ureter    ??? CHF (congestive heart failure) (CMS-HCC)    ??? Chronic atrial fibrillation (CMS-HCC) 07/20/2019   ??? COPD (chronic obstructive pulmonary disease) (CMS-HCC)    ??? Diabetes (  CMS-HCC)    ??? Gangrenous cholecystitis 10/11/2020   ??? GERD (gastroesophageal reflux disease)    ??? Hydronephrosis    ??? Hypertension    ??? Hyponatremia 10/11/2020   ??? Intermediate coronary syndrome (CMS-HCC) 03/13/2014   ??? Lower extremity edema 09/28/2020   ??? Lumbar stenosis    ??? Microscopic hematuria    ??? Nausea alone    ??? Neuropathy    ??? Nocturia    ??? Other chronic cystitis    ??? Pulmonary hypertension (CMS-HCC)    ??? Renal colic    ??? Sleep apnea    ??? Unstable angina pectoris (CMS-HCC) 03/13/2014    Social History     Tobacco Use   ??? Smoking status: Former Smoker     Packs/day: 0.33     Types: Cigarettes   ??? Smokeless tobacco: Never Used   ??? Tobacco comment: Quit a few years ago   Substance Use Topics   ??? Alcohol use: No      Past Surgical History:   Procedure Laterality Date   ??? BACK SURGERY  1995   ??? CARPAL TUNNEL RELEASE Left 2014   ??? HYSTERECTOMY  1971   ??? IR INSERT CHOLECYSTOSMY TUBE PERCUTANEOUS  10/02/2020    IR INSERT CHOLECYSTOSMY TUBE PERCUTANEOUS 10/02/2020 Braulio Conte, MD IMG [facility]IR [facility]   ??? LUMBAR DISC SURGERY     ??? PR REMOVAL GALLBLADDER N/A 10/06/2020    Procedure: CHOLECYSTECTOMY;  Surgeon: Katherina Mires, MD;  Location: MAIN OR [facility];  Service: Trauma   ??? PR RIGHT HEART CATH O2 SATURATION & CARDIAC OUTPUT N/A 09/30/2020    Procedure: Right Heart Catheterization;  Surgeon: Neal Dy, MD;  Location: [facility] CATH;  Service: Cardiology   ??? PR RIGHT HEART CATH O2 SATURATION & CARDIAC OUTPUT N/A 01/09/2021    Procedure: Right Heart Catheterization;  Surgeon: Lesle Reek, MD;  Location: [facility] CATH;  Service: Cardiology    Family History   Problem Relation Age of Onset   ??? Hypertension Mother    ??? Cancer Father         COLON CANCER   ??? Anesthesia problems Neg Hx    ??? Broken bones Neg Hx    ??? Clotting disorder Neg Hx    ??? Collagen disease Neg Hx    ??? Diabetes Neg Hx    ??? Dislocations Neg Hx    ??? Fibromyalgia Neg Hx    ??? Gout Neg Hx    ??? Hemophilia Neg Hx    ??? Osteoporosis Neg Hx    ??? Rheumatologic disease Neg Hx    ??? Scoliosis Neg Hx    ??? Severe sprains Neg Hx    ??? Sickle cell anemia Neg Hx    ??? Spinal Compression Fracture Neg Hx    ??? GU problems Neg Hx    ??? Kidney cancer Neg Hx    ??? Prostate cancer Neg Hx         Nitrofurantoin and Lipitor [atorvastatin]     Objective Findings  Precautions / Restrictions  Falls precautions    Weight Bearing  Non-applicable    Required Braces or Orthoses  Non-applicable    Communication Preference  Verbal    Pain  Pt reports no pain.    Equipment / Environment  Vascular access (PIV, TLC, Port-a-cath, PICC), Caregiver wearing mask for full session, Patient not wearing mask for full session    Living Situation  Living Environment: House  Lives With: Daughter, Family (son in  law)  Home Living: One level home, Stairs to enter with rails, Walk-in shower, Grab bars in shower, Shower chair with back, Grab bars around toilet, Shower chair without back, Handicapped height toilet  Rail placement (outside): Bilateral rails  Number of Stairs to Enter (outside): 2  Equipment available at home: Rollator, Paediatric nurse with back, Shower chair without back, Oxygen     Cognition   Orientation Level:  Oriented x 4   Arousal/Alertness:  Appropriate responses to stimuli   Attention Span:  Appears intact   Memory:  Appears intact   Following Commands:  Follows all commands and directions without difficulty   Safety Judgment:  Good awareness of safety precautions   Awareness of Errors:  Good awareness of safety precautions   Problem Solving:  Able to problem solve independently   Comments:      Vision / Hearing   Vision: Wears glasses all the time     Hearing: No deficit identified         Hand Function:  Right Hand Function: Right hand grip strength, ROM and coordination WNL  Left Hand Function: Left hand grip strength, ROM and coordination WNL  Hand Dominance: Right    Skin Inspection:  Skin Inspection: Intact where visualized    ROM / Strength:  UE ROM/Strength: Left WFL, Right WFL  LE ROM/Strength: Left WFL, Right WFL    Coordination:  Coordination: WFL    Sensation:  RUE Sensation: RUE intact  LUE Sensation: LUE intact    Balance:  sitting balance static I dynamic I     standing balance static mod I using RW  dynamic mod I using RW    Functional Mobility  Transfer Assistance Needed: No  Bed Mobility Assistance Needed: No  Ambulation: mod I using RW      ADLs  ADLs: Supervision  IADLs: NT      Vitals / Orthostatics  At Rest: NA  With Activity: NA  Orthostatics: NA      Medical Staff Made Aware: RN aware      Occupational Therapy Session Duration  OT Individual [mins]: 30         I attest that I have reviewed the above information.  Signed: Gladys Damme, OT  Filed 04/20/2021

## 2021-04-20 NOTE — Unmapped (Signed)
Patient alert and oriented to person, place, time and situation. Denies pain and or discomfort at time of this note. Bed remains in the lowest position, wheels locked and call bell placed within reach  Problem: Adult Inpatient Plan of Care  Goal: Plan of Care Review  Outcome: Progressing  Goal: Patient-Specific Goal (Individualized)  Outcome: Progressing  Goal: Absence of Hospital-Acquired Illness or Injury  Outcome: Progressing  Goal: Optimal Comfort and Wellbeing  Outcome: Progressing  Goal: Readiness for Transition of Care  Outcome: Progressing  Goal: Rounds/Family Conference  Outcome: Progressing     Problem: Self-Care Deficit  Goal: Improved Ability to Complete Activities of Daily Living  Outcome: Progressing     Problem: Fall Injury Risk  Goal: Absence of Fall and Fall-Related Injury  Outcome: Progressing     Problem: Perinatal Fall Injury Risk  Goal: Absence of Fall, Infant Drop and Related Injury  Outcome: Progressing     Problem: Adjustment to Illness (Heart Failure)  Goal: Optimal Coping  Outcome: Progressing     Problem: Cardiac Output Decreased (Heart Failure)  Goal: Optimal Cardiac Output  Outcome: Progressing     Problem: Dysrhythmia (Heart Failure)  Goal: Stable Heart Rate and Rhythm  Outcome: Progressing     Problem: Fluid Imbalance (Heart Failure)  Goal: Fluid Balance  Outcome: Progressing     Problem: Functional Ability Impaired (Heart Failure)  Goal: Optimal Functional Ability  Outcome: Progressing     Problem: Oral Intake Inadequate (Heart Failure)  Goal: Optimal Nutrition Intake  Outcome: Progressing     Problem: Respiratory Compromise (Heart Failure)  Goal: Effective Oxygenation and Ventilation  Outcome: Progressing     Problem: Sleep Disordered Breathing (Heart Failure)  Goal: Effective Breathing Pattern During Sleep  Outcome: Progressing

## 2021-04-21 LAB — BASIC METABOLIC PANEL
ANION GAP: 5 mmol/L (ref 5–14)
BLOOD UREA NITROGEN: 35 mg/dL — ABNORMAL HIGH (ref 9–23)
BUN / CREAT RATIO: 21
CALCIUM: 8.1 mg/dL — ABNORMAL LOW (ref 8.7–10.4)
CHLORIDE: 104 mmol/L (ref 98–107)
CO2: 29.2 mmol/L (ref 20.0–31.0)
CREATININE: 1.68 mg/dL — ABNORMAL HIGH
EGFR CKD-EPI AA FEMALE: 33 mL/min/{1.73_m2} — ABNORMAL LOW (ref >=60)
EGFR CKD-EPI NON-AA FEMALE: 29 mL/min/{1.73_m2} — ABNORMAL LOW (ref >=60)
GLUCOSE RANDOM: 162 mg/dL (ref 70–179)
POTASSIUM: 4.5 mmol/L (ref 3.4–4.8)
SODIUM: 138 mmol/L (ref 135–145)

## 2021-04-21 LAB — CBC
HEMATOCRIT: 25.4 % — ABNORMAL LOW (ref 34.0–44.0)
HEMOGLOBIN: 7.9 g/dL — ABNORMAL LOW (ref 11.3–14.9)
MEAN CORPUSCULAR HEMOGLOBIN CONC: 31.2 g/dL — ABNORMAL LOW (ref 32.0–36.0)
MEAN CORPUSCULAR HEMOGLOBIN: 26.3 pg (ref 25.9–32.4)
MEAN CORPUSCULAR VOLUME: 84.4 fL (ref 77.6–95.7)
MEAN PLATELET VOLUME: 8.3 fL (ref 6.8–10.7)
PLATELET COUNT: 245 10*9/L (ref 150–450)
RED BLOOD CELL COUNT: 3.01 10*12/L — ABNORMAL LOW (ref 3.95–5.13)
RED CELL DISTRIBUTION WIDTH: 17.1 % — ABNORMAL HIGH (ref 12.2–15.2)
WBC ADJUSTED: 10.7 10*9/L (ref 3.6–11.2)

## 2021-04-21 LAB — MAGNESIUM: MAGNESIUM: 2.9 mg/dL — ABNORMAL HIGH (ref 1.6–2.6)

## 2021-04-21 MED ORDER — VALACYCLOVIR 500 MG TABLET
ORAL_TABLET | Freq: Two times a day (BID) | ORAL | 0 refills | 1 days | Status: CP
Start: 2021-04-21 — End: 2021-04-22

## 2021-04-21 MED ORDER — CEFPODOXIME 200 MG TABLET
ORAL_TABLET | Freq: Two times a day (BID) | ORAL | 0 refills | 7 days | Status: CP
Start: 2021-04-21 — End: 2021-04-28

## 2021-04-21 MED ORDER — GABAPENTIN 300 MG CAPSULE
ORAL_CAPSULE | Freq: Three times a day (TID) | ORAL | 0 refills | 30.00000 days | Status: CN
Start: 2021-04-21 — End: 2021-05-21

## 2021-04-21 MED ORDER — AMIODARONE 200 MG TABLET
ORAL_TABLET | Freq: Every day | ORAL | 1 refills | 30 days | Status: CP
Start: 2021-04-21 — End: 2021-06-20

## 2021-04-21 MED ADMIN — spironolactone (ALDACTONE) tablet 50 mg: 50 mg | ORAL | @ 14:00:00 | Stop: 2021-04-21

## 2021-04-21 MED ADMIN — apixaban (ELIQUIS) tablet 5 mg: 5 mg | ORAL | @ 01:00:00

## 2021-04-21 MED ADMIN — gabapentin (NEURONTIN) capsule 600 mg: 600 mg | ORAL | @ 01:00:00

## 2021-04-21 MED ADMIN — tafamidis cap 61 mg: 61 mg | ORAL | @ 14:00:00 | Stop: 2021-04-21

## 2021-04-21 MED ADMIN — amiodarone (PACERONE) tablet 200 mg: 200 mg | ORAL | @ 14:00:00 | Stop: 2021-04-21

## 2021-04-21 MED ADMIN — torsemide (DEMADEX) tablet 40 mg: 40 mg | ORAL | @ 14:00:00 | Stop: 2021-04-21

## 2021-04-21 MED ADMIN — gabapentin (NEURONTIN) capsule 300 mg: 300 mg | ORAL | @ 19:00:00 | Stop: 2021-04-21

## 2021-04-21 MED ADMIN — gabapentin (NEURONTIN) capsule 300 mg: 300 mg | ORAL | @ 14:00:00 | Stop: 2021-04-21

## 2021-04-21 MED ADMIN — valACYclovir (VALTREX) tablet 500 mg: 500 mg | ORAL | @ 14:00:00 | Stop: 2021-04-21

## 2021-04-21 MED ADMIN — montelukast (SINGULAIR) tablet 10 mg: 10.0 mg | ORAL | @ 01:00:00

## 2021-04-21 MED ADMIN — sodium ferric gluconate (FERRLECIT) 125 mg in sodium chloride (NS) 0.9 % 100 mL IVPB: 125 mg | INTRAVENOUS | @ 01:00:00 | Stop: 2021-04-21

## 2021-04-21 MED ADMIN — fluticasone furoate-vilanteroL (BREO ELLIPTA) 100-25 mcg/dose inhaler 1 puff: 1 | RESPIRATORY_TRACT | @ 13:00:00 | Stop: 2021-04-21

## 2021-04-21 MED ADMIN — valACYclovir (VALTREX) tablet 500 mg: 500 mg | ORAL | @ 01:00:00 | Stop: 2021-04-22

## 2021-04-21 MED ADMIN — promethazine (PHENERGAN) tablet 12.5 mg: 12.5 mg | ORAL | @ 01:00:00 | Stop: 2021-04-20

## 2021-04-21 MED ADMIN — oxyCODONE (OxyCONTIN) 12 hr crush resistant ER/CR tablet 10 mg: 10 mg | ORAL | @ 01:00:00 | Stop: 2021-05-02

## 2021-04-21 MED ADMIN — umeclidinium (INCRUSE ELLIPTA) 62.5 mcg/actuation inhaler 1 puff: 1 | RESPIRATORY_TRACT | @ 13:00:00 | Stop: 2021-04-21

## 2021-04-21 MED ADMIN — sodium ferric gluconate (FERRLECIT) 125 mg in sodium chloride (NS) 0.9 % 100 mL IVPB: 125 mg | INTRAVENOUS | @ 14:00:00 | Stop: 2021-04-21

## 2021-04-21 MED ADMIN — cefTRIAXone (ROCEPHIN) 1 g in sodium chloride 0.9 % (NS) 100 mL IVPB-connector bag: 1 g | INTRAVENOUS | @ 01:00:00 | Stop: 2021-04-27

## 2021-04-21 MED ADMIN — oxyCODONE (OxyCONTIN) 12 hr crush resistant ER/CR tablet 10 mg: 10 mg | ORAL | @ 14:00:00 | Stop: 2021-04-21

## 2021-04-21 MED ADMIN — apixaban (ELIQUIS) tablet 5 mg: 5 mg | ORAL | @ 14:00:00 | Stop: 2021-04-21

## 2021-04-21 NOTE — Unmapped (Signed)
Home health has been set up for through the agency listed below. The Home health agency will be contacting you to set up a time for them to come see you in your home within 2 days of your discharge.  If you have not heard from them prior to 04/23/21 or you have any questions about home health, please contact them at the phone number listed below.    [facility] - Admitted Since 04/18/2021     Service Provider Selected Services Address Phone Fax Patient Preferred    [facility] [address] [place] [address] [phone] ???

## 2021-04-21 NOTE — Unmapped (Signed)
Patient discharged back to home via Daughter. Discussed aVS with patient and family. All questions answered. Pt able to verbalize understanding.  Problem: Adult Inpatient Plan of Care  Goal: Plan of Care Review  04/21/2021 1523 by Debe Coder, RN  Outcome: Resolved  04/21/2021 1522 by Debe Coder, RN  Outcome: Progressing  Goal: Patient-Specific Goal (Individualized)  04/21/2021 1523 by Debe Coder, RN  Outcome: Resolved  04/21/2021 1522 by Debe Coder, RN  Outcome: Progressing  Goal: Absence of Hospital-Acquired Illness or Injury  04/21/2021 1523 by Debe Coder, RN  Outcome: Resolved  04/21/2021 1522 by Debe Coder, RN  Outcome: Progressing  Intervention: Identify and Manage Fall Risk  Recent Flowsheet Documentation  Taken 04/21/2021 0800 by Debe Coder, RN  Safety Interventions: fall reduction program maintained  Goal: Optimal Comfort and Wellbeing  04/21/2021 1523 by Debe Coder, RN  Outcome: Resolved  04/21/2021 1522 by Debe Coder, RN  Outcome: Progressing  Goal: Readiness for Transition of Care  04/21/2021 1523 by Debe Coder, RN  Outcome: Resolved  04/21/2021 1522 by Debe Coder, RN  Outcome: Progressing  Goal: Rounds/Family Conference  04/21/2021 1523 by Debe Coder, RN  Outcome: Resolved  04/21/2021 1522 by Debe Coder, RN  Outcome: Progressing     Problem: Self-Care Deficit  Goal: Improved Ability to Complete Activities of Daily Living  04/21/2021 1523 by Debe Coder, RN  Outcome: Resolved  04/21/2021 1522 by Debe Coder, RN  Outcome: Progressing     Problem: Fall Injury Risk  Goal: Absence of Fall and Fall-Related Injury  04/21/2021 1523 by Debe Coder, RN  Outcome: Resolved  04/21/2021 1522 by Debe Coder, RN  Outcome: Progressing  Intervention: Promote Injury-Free Environment  Recent Flowsheet Documentation  Taken 04/21/2021 0800 by Debe Coder, RN  Safety Interventions: fall reduction program maintained     Problem: Fall Injury Risk  Goal: Absence of Fall and Fall-Related Injury  Intervention: Promote Injury-Free Environment  Recent Flowsheet Documentation  Taken 04/21/2021 0800 by Debe Coder, RN  Safety Interventions: fall reduction program maintained     Problem: Perinatal Fall Injury Risk  Goal: Absence of Fall, Infant Drop and Related Injury  04/21/2021 1523 by Debe Coder, RN  Outcome: Resolved  04/21/2021 1522 by Debe Coder, RN  Outcome: Progressing  Intervention: Promote Injury-Free Environment  Recent Flowsheet Documentation  Taken 04/21/2021 0800 by Debe Coder, RN  Safety Interventions: fall reduction program maintained     Problem: Adjustment to Illness (Heart Failure)  Goal: Optimal Coping  04/21/2021 1523 by Debe Coder, RN  Outcome: Resolved  04/21/2021 1522 by Debe Coder, RN  Outcome: Progressing     Problem: Cardiac Output Decreased (Heart Failure)  Goal: Optimal Cardiac Output  04/21/2021 1523 by Debe Coder, RN  Outcome: Resolved  04/21/2021 1522 by Debe Coder, RN  Outcome: Progressing     Problem: Dysrhythmia (Heart Failure)  Goal: Stable Heart Rate and Rhythm  04/21/2021 1523 by Debe Coder, RN  Outcome: Resolved  04/21/2021 1522 by Debe Coder, RN  Outcome: Progressing     Problem: Fluid Imbalance (Heart Failure)  Goal: Fluid Balance  04/21/2021 1523 by Debe Coder, RN  Outcome: Resolved  04/21/2021 1522 by Debe Coder, RN  Outcome: Progressing     Problem: Functional Ability Impaired (Heart Failure)  Goal: Optimal Functional Ability  04/21/2021 1523 by Debe Coder, RN  Outcome: Resolved  04/21/2021 1522 by Debe Coder, RN  Outcome: Progressing     Problem: Oral Intake Inadequate (Heart Failure)  Goal: Optimal Nutrition Intake  04/21/2021 1523 by Debe Coder, RN  Outcome: Resolved  04/21/2021 1522 by Debe Coder, RN  Outcome: Progressing     Problem: Respiratory Compromise (Heart Failure)  Goal: Effective Oxygenation and Ventilation  04/21/2021 1523 by Debe Coder, RN  Outcome: Resolved  04/21/2021 1522 by Debe Coder, RN  Outcome: Progressing     Problem: Sleep Disordered Breathing (Heart Failure)  Goal: Effective Breathing Pattern During Sleep  04/21/2021 1523 by Debe Coder, RN  Outcome: Resolved  04/21/2021 1522 by Debe Coder, RN  Outcome: Progressing    Patient discharged

## 2021-04-21 NOTE — Unmapped (Signed)
A/ox4. Amb with SBA. IV Iron infusions. PO ABX;  No c/o pain; scheduled oxy. 3L [place], wears at home. Will continue to monitor.

## 2021-04-21 NOTE — Unmapped (Signed)
A/ox4. Amb with SBA. New IV to LFA for IV iron. PO ABX; possible discharge tomorrow after completing iron doses. No c/o pain; scheduled oxy. 3L [place], wears at home. Will continue to monitor.       Problem: Adult Inpatient Plan of Care  Goal: Plan of Care Review  Outcome: Progressing  Goal: Patient-Specific Goal (Individualized)  Outcome: Progressing  Goal: Absence of Hospital-Acquired Illness or Injury  Outcome: Progressing  Intervention: Identify and Manage Fall Risk  Recent Flowsheet Documentation  Taken 04/20/2021 0800 by Valma Cava, RN  Safety Interventions: fall reduction program maintained  Goal: Optimal Comfort and Wellbeing  Outcome: Progressing  Goal: Readiness for Transition of Care  Outcome: Progressing  Goal: Rounds/Family Conference  Outcome: Progressing     Problem: Self-Care Deficit  Goal: Improved Ability to Complete Activities of Daily Living  Outcome: Progressing     Problem: Fall Injury Risk  Goal: Absence of Fall and Fall-Related Injury  Outcome: Progressing  Intervention: Promote Injury-Free Environment  Recent Flowsheet Documentation  Taken 04/20/2021 0800 by Valma Cava, RN  Safety Interventions: fall reduction program maintained     Problem: Perinatal Fall Injury Risk  Goal: Absence of Fall, Infant Drop and Related Injury  Outcome: Progressing  Intervention: Promote Injury-Free Environment  Recent Flowsheet Documentation  Taken 04/20/2021 0800 by Valma Cava, RN  Safety Interventions: fall reduction program maintained     Problem: Adjustment to Illness (Heart Failure)  Goal: Optimal Coping  Outcome: Progressing     Problem: Cardiac Output Decreased (Heart Failure)  Goal: Optimal Cardiac Output  Outcome: Progressing     Problem: Dysrhythmia (Heart Failure)  Goal: Stable Heart Rate and Rhythm  Outcome: Progressing     Problem: Fluid Imbalance (Heart Failure)  Goal: Fluid Balance  Outcome: Progressing     Problem: Functional Ability Impaired (Heart Failure)  Goal: Optimal Functional Ability  Outcome: Progressing     Problem: Oral Intake Inadequate (Heart Failure)  Goal: Optimal Nutrition Intake  Outcome: Progressing     Problem: Respiratory Compromise (Heart Failure)  Goal: Effective Oxygenation and Ventilation  Outcome: Progressing     Problem: Sleep Disordered Breathing (Heart Failure)  Goal: Effective Breathing Pattern During Sleep  Outcome: Progressing

## 2021-04-21 NOTE — Unmapped (Signed)
Patient alert and oriented x4. VSSA. Cam negative No falls this shift.   Patient takes pills  whole with water.   Problem: Adult Inpatient Plan of Care  Goal: Plan of Care Review  Outcome: Progressing  Goal: Patient-Specific Goal (Individualized)  Outcome: Progressing  Goal: Absence of Hospital-Acquired Illness or Injury  Outcome: Progressing  Intervention: Identify and Manage Fall Risk  Recent Flowsheet Documentation  Taken 04/21/2021 0800 by Debe Coder, RN  Safety Interventions: fall reduction program maintained  Goal: Optimal Comfort and Wellbeing  Outcome: Progressing  Goal: Readiness for Transition of Care  Outcome: Progressing  Goal: Rounds/Family Conference  Outcome: Progressing     Problem: Adult Inpatient Plan of Care  Goal: Optimal Comfort and Wellbeing  Outcome: Progressing     Problem: Self-Care Deficit  Goal: Improved Ability to Complete Activities of Daily Living  Outcome: Progressing     Problem: Fall Injury Risk  Goal: Absence of Fall and Fall-Related Injury  Outcome: Progressing  Intervention: Promote Injury-Free Environment  Recent Flowsheet Documentation  Taken 04/21/2021 0800 by Debe Coder, RN  Safety Interventions: fall reduction program maintained     Problem: Perinatal Fall Injury Risk  Goal: Absence of Fall, Infant Drop and Related Injury  Outcome: Progressing  Intervention: Promote Injury-Free Environment  Recent Flowsheet Documentation  Taken 04/21/2021 0800 by Debe Coder, RN  Safety Interventions: fall reduction program maintained     Problem: Adjustment to Illness (Heart Failure)  Goal: Optimal Coping  Outcome: Progressing     Problem: Cardiac Output Decreased (Heart Failure)  Goal: Optimal Cardiac Output  Outcome: Progressing     Problem: Dysrhythmia (Heart Failure)  Goal: Stable Heart Rate and Rhythm  Outcome: Progressing     Problem: Fluid Imbalance (Heart Failure)  Goal: Fluid Balance  Outcome: Progressing     Problem: Oral Intake Inadequate (Heart Failure)  Goal: Optimal Nutrition Intake  Outcome: Progressing     Problem: Functional Ability Impaired (Heart Failure)  Goal: Optimal Functional Ability  Outcome: Progressing      No complaints of pain have been voiced. Bedside table, call bell, telephone, and personal items are within reach. Bed in lowest position with wheels locked. Plan of care updated.

## 2021-04-21 NOTE — Unmapped (Signed)
PHYSICAL THERAPY  Evaluation (04/21/21 0805)      Patient Name:?? Barbara Huber????????   Medical Record Number: [id number]   Date of Birth: [birth year]  Sex: Female??????????????      Treatment Diagnosis: order is for eval weakness     Activity Tolerance: Tolerated treatment well     ASSESSMENT  Problem List: Decreased endurance, Decreased mobility, Decreased strength      Assessment : [name] is a 80 y.o. 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 pt presents to PT with LE strength deficits, decreased endurance who will benefit from PT intervention to address for a safe transition home. Based on the AM-PAC 5 item raw score of 19/20, the patient is considered to be 19.51% impaired with basic mobility. This indicates pt will benefit from 3x/week at discharge to progress. After review of contributing co-morbitities and personal factors, clinical presentation and exam findings, patient demonstrates low complexity for evaluation and development of POC.         Today's Interventions: pt ed re PT POC, purse lip breathing, mobility, ambulation, ed on pacing, short walks through the day and OOB for meals, eval AM-PAC 19/20                            PLAN  Planned Frequency of Treatment:?? 1-2x per day for: 2-3x week       Planned Interventions: Education - Patient, Education - Family / caregiver, Diaphragmatic / Pursed-lip breathing, Balance activities, Endurance activities, Functional mobility, Stair training, Therapeutic exercise, Therapeutic activity     Post-Discharge Physical Therapy Recommendations:?? 3x weekly     PT DME Recommendations: None??????????       Goals:   Patient and Family Goals: home     Long Term Goal #1: 3 weeks PLOF        SHORT GOAL #1: pt will ambulate 100 ft mod I, LRAD SpO2 >90%  ?????????????????????? Time Frame : 1 week  SHORT GOAL #2: pt will negotiate 2 steps with 2 rails, SBA  ?????????????????????? Time Frame : 1 week     ??????????????????????       ??????????????????????       ??????????????????????       Prognosis:?? Good     Barriers to Discharge: None     SUBJECTIVE  Equipment / Environment: Vascular access (PIV, TLC, Port-a-cath, PICC), Caregiver wearing mask for full session, Patient not wearing mask for full session  Patient reports: What do you want me to do?  Current Functional Status: pt received and session concluded with pt in the bed, needs in reach, RN aware  Services patient receives: PT  Prior Functional Status: pt reports she has 3 rollators that she can use inside or outside, no falls, 3 L [place], has home pulse ox and occasionally has to bump up oxygen  Equipment available at home: Rollator, Shower chair with back, Shower chair without back, Oxygen      Past Medical History:   Diagnosis Date   ??? Acute kidney injury superimposed on chronic kidney disease (CMS-HCC) 10/11/2020   ??? Acute on chronic diastolic (congestive) heart failure (CMS-HCC) 08/23/2020   ??? Arthritis    ??? Calculus of kidney    ??? Calculus of ureter    ??? CHF (congestive heart failure) (CMS-HCC)    ??? Chronic atrial  fibrillation (CMS-HCC) 07/20/2019   ??? COPD (chronic obstructive pulmonary disease) (CMS-HCC)    ??? Diabetes (CMS-HCC)    ??? Gangrenous cholecystitis 10/11/2020   ??? GERD (gastroesophageal reflux disease)    ??? Hydronephrosis    ??? Hypertension    ??? Hyponatremia 10/11/2020   ??? Intermediate coronary syndrome (CMS-HCC) 03/13/2014   ??? Lower extremity edema 09/28/2020   ??? Lumbar stenosis    ??? Microscopic hematuria    ??? Nausea alone    ??? Neuropathy    ??? Nocturia    ??? Other chronic cystitis    ??? Pulmonary hypertension (CMS-HCC)    ??? Renal colic    ??? Sleep apnea    ??? Unstable angina pectoris (CMS-HCC) 03/13/2014         @  Past Surgical History:   Procedure Laterality Date   ??? BACK SURGERY  1995   ??? CARPAL TUNNEL RELEASE Left 2014   ??? HYSTERECTOMY  1971   ??? IR INSERT CHOLECYSTOSMY TUBE PERCUTANEOUS  10/02/2020    IR INSERT CHOLECYSTOSMY TUBE PERCUTANEOUS 10/02/2020 Barbara Conte, MD IMG [facility]IR [facility]   ??? LUMBAR DISC SURGERY     ??? PR REMOVAL GALLBLADDER N/A 10/06/2020    Procedure: CHOLECYSTECTOMY;  Surgeon: Katherina Mires, MD;  Location: MAIN OR [facility];  Service: Trauma   ??? PR RIGHT HEART CATH O2 SATURATION & CARDIAC OUTPUT N/A 09/30/2020    Procedure: Right Heart Catheterization;  Surgeon: Neal Dy, MD;  Location: [facility] CATH;  Service: Cardiology   ??? PR RIGHT HEART CATH O2 SATURATION & CARDIAC OUTPUT N/A 01/09/2021    Procedure: Right Heart Catheterization;  Surgeon: Lesle Reek, MD;  Location: [facility] CATH;  Service: Cardiology          @     Allergies: Nitrofurantoin and Lipitor [atorvastatin]                  Objective Findings  Precautions / Restrictions  Precautions: Falls precautions  Weight Bearing Status: Non-applicable  Required Braces or Orthoses: Non-applicable     Communication Preference: Verbal          Pain Comments: no complaints of pain  Medical Tests / Procedures: reviewed in EPIC  Equipment / Environment: Vascular access (PIV, TLC, Port-a-cath, PICC), Supplemental oxygen, Patient not wearing mask for full session, Caregiver wearing mask for full session     At Rest: SpO2 92% on 3 L [place]  With Activity: SpO2 90% on 3 L [place]  Orthostatics: asymptomatic  Airway Clearance: mobility, purse lip breathing     Living Situation  Living Environment: House  Lives With: Daughter, Family (daughter works)  Home Living: One level home, Stairs to enter with rails, Walk-in shower, Grab bars in shower, Paediatric nurse with back, Grab bars around toilet, Shower chair without back, Handicapped height toilet  Rail placement (outside): Bilateral rails  Number of Stairs to [employer] (outside): 2      Cognition  Cognition: WFL        Skin Inspection: Intact where visualized     UE ROM / Strength  UE ROM/Strength: Left WFL, Right WFL  LE ROM / Strength  LE ROM/Strength: Right Impaired/Limited, Left Impaired/Limited  RLE Impairment: Reduced strength  LLE Impairment: Reduced strength  LE comment: + UE use with sit to stand, some difficulty raising LE's onto the bed     Motor/ Sensory/ Neuro  Coordination: WFL  Sensation: [facility]  Balance comment: stands supervision level with rollator  Bed Mobility: supine <> short sit mod I from semi-fowlers     Transfers  Transfers: Sit to Stand  Sit to Stand comments: sit<>stand mod I, rollator      Gait  Level of Assistance: Standby assist, set-up cues, supervision of patient - no hands on  Assistive Device: Front wheel walker  Distance Ambulated (ft): 40 ft  Gait: slow but steady                  Endurance: no SOB but oxygen levels at 90% after ambulating a short distance     Physical Therapy Session Duration  PT Individual [mins]: 20     Medical Staff Made Aware: RN Melissa     I attest that I have reviewed the above information.  SignedDelbert Phenix, PT  Filed 04/21/2021

## 2021-04-21 NOTE — Unmapped (Signed)
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 Barbara [facility]er Course     PCP Post-Discharge Follow Up Issues:   [ ]  Patient is being sent home on 7 day course of cefpodoxime for management of UTI. Follow-up on completion of her abx course.  [ ]  Her gabapentin was titrated down to 300mg  TID from 600mg  TID due to concern previous dose was contributing to her weakness/confusion in the setting of elevated Cr. Follow-up on any side effects from titration and any more episodes of confusion. Patient may need to be titrated down further.   [ ]  Follow-up on if patient is actually taking clonazepam. Noted in recent fill history but patient denied currently taking this medication. This could also be contributing to her weakness/confusion if continued.  [ ]  Follow-up on perceived patient benefit from taking Oxycontin as patient states her pain is well controlled. She may be able to discontinue this medication to further reduce her use of sedating medications.   [ ]  Patient was given IV iron x5 while admitted. Follow-up on hgb and iron levels   To contact the discharging attending physician with regard to the patient's hospitalization or follow-up needs, please call: [facility] Division of Geriatrics [phone]).     Barbara Huber is a 80 y.o. female with PMHx of TTR Cardiac amyloid, COPD on 3L O2 at home, OSA, pHTN, T2DM, [name]3 who presented to Barbara [facility]er admit with generalized weakness 2/2 urosepsis, polypharmacy, and Iron Deficiency. [facility]er Course as outlined by problem list below.    Urosepsis I Complicated UTI  Patient initially admitted with urinary hesitancy, fevers, chills with WBC 14.7 and UA notable for trace LE, 50 WBCs and few bacteria concerning for urosepsis. Of note, patient reported recent treatment for UTI with augmentin, likely contributing to bland urinary findings. UCx obtained prior to admission notable for >100K Gram negative rods. As a result, patient was treated with IV ceftriaxone while admitted and discharged with plans to complete 7 day course of cefpodoxime 200mg  BID. Advised to follow-up with PCP to ensure clearance of infection.    Concern for Polypharmacy I Chronic Pain I Peripheral Neuropathy  Patient presenting with recent history of generalized weakness in the setting of elevated creatinine.  Patient is on multiple sedating medications at home, including gabapentin 800 mg 3 times daily for peripheral neuropathy and OxyContin 10 mg twice daily.  Medication fill history also concerning for recent clonazepam use.  Creatinine clearance  in setting of ongoing urosepsis as above indicative of supratherapeutic levels of gabapentin.  As a result, her gabapentin was gradually decreased to 300 TID to minimize polypharmacy contributing to generalized weakness. She was advised to follow-up with her PCP for continued management of her chronic pain to limit her sedating medications further.    Iron Deficiency Anemia:   Patient with known iron deficiency anemia with hemoglobin 8.5 and iron <20 on admission. Patient was scheduled to receive Fe infusions in outpatient setting. For further optimization of generalized weakness, patient received 5 doses of 125 mg IV iron infusions while admitted. She reports having discontinued PO iron supplementation at home in the setting of whole body pruritis. She was advised to discuss this further with her PCP.      COPD on 3L at baseline:   While admitted, patient was maintained on [facility], continued on her home Singulair, and given Encruse Ellipta for home Spiriva, Barbara [facility]er for home Advair.     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 Her home torsemide 40 mg daily and spironolactone 50 mg daily were continued this admission. Home tafamidis also continued. Patient was advised to follow-up with outpatient Cardiologist Dr. Wonda Huber for further management.    Atrial Fibrillation: Home eliquis 5 BID, amiodarone 200mg  daily continued this admission    GERIATRIC ASSESSMENT:     A geriatric assessment was not completed.    *Refer below for the patient's functional / cognitive assessments, and advance care planning*      Functional Assessment        ADLs:  IADLs:   Feeding: Independent  Dressing: Independent  Ambulation: Requires Assistance  Toileting: Independent  Bathing: Independent   Using the phone: Independent  Shopping: Dependent  Meal preparation: Requires Assistance  Medication mgmt: Independent  Managing finances: Dependent  Housework: Requires Presenter, broadcasting (driving or navigating public transit): Dependent     Living situation: Patient lives in family or friend's home with their children.     Changes in ADLs during hospitalization: None    Assistive devices: Walker    Additional services recommended at discharge: None    Cognitive Assessment     Delirium Assessment: CAM (Confusion Assessment Method):    On discharge, the patient did not show evidence of delirium. .      Other cognitive assessment:  The patient did not undergo formal screening for cognitive impairment.     Advance Care Planning     The patient has NOT completed a living will, HCPOA or MOST form. .    Code Status: Full Code    Surrogate decision maker: Devota Pace, daughter    Emergency Contact:  Extended Emergency Contact Information  Primary Emergency Contact: [name],[name]  Address: [address]           [place], [place] [address] [place]  Home Phone: [phone]  Work Phone: [phone]  Mobile Phone: [phone]  Relation: Daughter    Procedures   None  No admission procedures for Huber encounter.    Discharge Day Services:   BP 103/51  - Pulse 61  - Temp 35.8 ??C (Oral)  - Resp 19  - Ht 162.6 cm (5' 4)  - Wt (!) 101.4 kg (223 lb 8 oz)  - SpO2 91%  - BMI 38.36 kg/m??    Last 5 Recorded Weights    04/18/21 2200 04/19/21 0600 04/20/21 0558 04/21/21 0632   Weight: 96.6 kg (213 lb) Marland Kitchen)  104.3 kg (230 lb) (!) 101 kg (222 lb 11.2 oz) (!) 101.4 kg (223 lb 8 oz)       Pt seen on the day of discharge and determined appropriate for discharge.    Condition at Discharge: good    Length of Discharge: I spent greater than 30 mins in the discharge of this patient.    Discharge Medication List:        Your Medication List      STOP taking these medications    ferrous sulfate 325 (65 FE) MG tablet        START taking these medications    cefpodoxime 200 MG tablet  Commonly known as: VANTIN  Take 1 tablet (200 mg total) by mouth Two (2) times a day for 7 days.        CHANGE how you take these medications    gabapentin 300 MG capsule  Commonly known as: NEURONTIN  Take 1 capsule (300 mg total) by mouth Three (3) times a day.  What changed: how much to take        CONTINUE taking these medications    ACCU-CHEK AVIVA PLUS TEST STRP Strp  Generic drug: blood sugar diagnostic  USE TO CHECK BLOOD SUGAR 3 TIMES A DAY BEFORE MEALS     acetaminophen 500 MG tablet  Commonly known as: TYLENOL  Take 1,000 mg by mouth daily as needed for pain.     ADVAIR HFA 115-21 mcg/actuation inhaler  Generic drug: fluticasone propion-salmeteroL  Inhale 2 puffs Two (2) times a day.     albuterol 90 mcg/actuation inhaler  Commonly known as: PROVENTIL HFA;VENTOLIN HFA  Inhale 2 puffs every eight (8) hours as needed for wheezing.     amiodarone 200 MG tablet  Commonly known as: PACERONE  Take 1 tablet (200 mg total) by mouth daily.     apixaban 5 mg Tab  Commonly known as: ELIQUIS  Take 1 tablet (5 mg total) by mouth Two (2) times a day.     estradioL 0.01 % (0.1 mg/gram) vaginal cream  Commonly known as: ESTRACE  INSERT ONE GRAM VAGINALLY AT BEDTIME     lancets Misc  1 each by Miscellaneous route daily. Accu Check     metFORMIN 500 MG tablet  Commonly known as: GLUCOPHAGE  Take 1 tablet (500 mg total) by mouth 2 (two) times a day with meals.     montelukast 10 mg tablet  Commonly known as: SINGULAIR  Take 1 tablet (10 mg total) by mouth daily.     NARCAN 4 mg/actuation nasal spray  Generic drug: naloxone  1 spray into alternating nostrils once as needed (opioid overdose).     oxyCODONE 10 mg Tr12 12 hr crush resistant ER/CR tablet  Commonly known as: OxyCONTIN  Take 10 mg by mouth every twelve (12) hours.     OXYGEN-AIR DELIVERY SYSTEMS MISC  5 L by Miscellaneous route.     rosuvastatin 5 MG tablet  Commonly known as: CRESTOR  Take 1 tablet (5 mg total) by mouth every other day.     SHINGRIX (PF) 50 mcg/0.5 mL Susr injection  Generic drug: varicella-zoster gE-AS01B (PF)  Inject 0.5 mL into the muscle.     spironolactone 50 MG tablet  Commonly known as: ALDACTONE  Take 1 tablet (50 mg total) by mouth daily.     tiotropium bromide 2.5 mcg/actuation inhalation mist  Commonly known as: SPIRIVA RESPIMAT  Inhale 2 puffs daily.     torsemide 20  MG tablet  Commonly known as: DEMADEX  Take 2 tablets (40 mg total) by mouth daily. May also take 2 tablets (40 mg total) daily as needed (weight gain).     valACYclovir 500 MG tablet  Commonly known as: VALTREX  Take 1 tablet (500 mg total) by mouth Two (2) times a day for 1 day.     VYNDAMAX 61 mg Cap  Generic drug: tafamidis  Take 1 capsule (61 mg) by mouth daily.               Pending Test Results (if blank, then none):  Pending Labs     Order Current Status    Blood Culture #1 Preliminary result    Blood Culture #2 Preliminary result          Most Recent Labs:  Microbiology Results (last day)     Procedure Component Value Date/Time Date/Time    Blood Culture #1 [[id number]]  (Normal) Collected: 04/18/21 1226    Lab Status: Preliminary result Specimen: Blood from 1 Peripheral Draw Updated: 04/21/21 1245     Blood Culture, Routine No Growth at 72 hours    Blood Culture #2 [[id number]]  (Normal) Collected: 04/18/21 1226    Lab Status: Preliminary result Specimen: Blood from 1 Peripheral Draw Updated: 04/21/21 1245     Blood Culture, Routine No Growth at 72 hours          Lab Results   Component Value Date    WBC 10.7 04/21/2021    HGB 7.9 (L) 04/21/2021    HCT 25.4 (L) 04/21/2021    PLT 245 04/21/2021       Lab Results   Component Value Date    NA 138 04/21/2021    K 4.5 04/21/2021    CL 104 04/21/2021    CO2 29.2 04/21/2021    BUN 35 (H) 04/21/2021    CREATININE 1.68 (H) 04/21/2021    CALCIUM 8.1 (L) 04/21/2021    MG 2.9 (H) 04/21/2021    PHOS 3.7 01/07/2021       Lab Results   Component Value Date    ALKPHOS 56 04/18/2021    BILITOT 0.5 04/18/2021    BILIDIR 0.60 (H) 10/11/2020    PROT 6.9 04/18/2021    ALBUMIN 3.1 (L) 04/18/2021    ALT <7 (L) 04/18/2021    AST 12 04/18/2021    GGT 215 (H) 09/29/2020       Lab Results   Component Value Date    PT 26.4 (H) 09/27/2020    INR 2.27 09/27/2020    APTT 46.9 (H) 01/10/2021     [facility]:  ECG 12 lead (Adult)    Result Date: 04/18/2021  NORMAL SINUS RHYTHM LOW VOLTAGE POOR R WAVE PROGRESSION IN ANTERIOR PRECORDIAL LEADS ABNORMAL ECG WHEN COMPARED WITH ECG OF 21-Jan-2021 23:36, NO SIGNIFICANT CHANGE WAS FOUND Confirmed by Christella Noa ([id number]) on 04/18/2021 3:04:28 PM    XR Chest Portable    Result Date: 04/18/2021  EXAM: XR CHEST PORTABLE DATE: 04/18/2021 11:53 AM ACCESSION: [id number] UN DICTATED: 04/18/2021 12:23 PM INTERPRETATION LOCATION: Main [facility] CLINICAL INDICATION: 80 years old Female with FEVER  COMPARISON: Chest 01/21/2021 TECHNIQUE: Portable Chest Radiograph. FINDINGS: No consolidation or infiltrate. There is prominence of interstitial lung markings. Images are degraded by superimposed soft tissue. No pleural effusion or pneumothorax Stable cardiomediastinal silhouette.     Suspected mild pulmonary edema Correlate clinically.      Discharge Instructions:      Activity Instructions  Activity as tolerated                Other Instructions     Call MD for:  difficulty breathing, headache or visual disturbances      Call MD for:  persistent nausea or vomiting      Call MD for:  severe uncontrolled pain      Call MD for:  temperature >38.5 Celsius      Discharge instructions      Why was I admitted?   You have been admitted with  confusion, weakness, feeling of incomplete bladder emptying and fatigue . This is caused by infection in your bladder . You were treated with antibiotics while admitted and will continue on antibiotics for 7 days at home.     What should I watch for in the next few weeks?  Watch out for any new or worsening urinary symptoms like painful urination, urinary frequency, urinary incontinence. Also watch out for any new or worsening fevers, chills, rashes, confusion, or fatigue.    Who will I follow up with?  We have scheduled you follow up with these doctors:  - Internal Medicine on 04/29/2021 at 1:40 PM for post hospitalization follow-up  - Dr. Haze Justin, Pulmonology, on 05/23/2021 at 12:15 PM for continued management of your COPD  - Gordan Payment, AGNP, Urology on 06/23/2021 at 10:30 AM for continued evaluation of your urinary retention  - Dr. Carin Hock, Cardiology, on 08/29/2021 at 10:00 AM for further management of your amyloidosis    MEDICATION CHANGES  - Please CONTINUE TAKING Cefpodoxime 200 mg twice daily for 7 days (through 5/2) for definitive management of your UTI  - Please CHANGE your home gabapentin dose from 600mg  three times daily to 300mg  three times daily to limit sedating side effects contributing to your weakness on admission  - Please CONTINUE to take valacyclovir 500mg  for 1 additional day to ensure clearance of your HSV infection       When you see your primary doctor, please get these labs drawn:  - CBC, BMP, and Mg     What should I do if I start to feel sick again?  If the confusion, fatigue, or weakness worsens, we would recommend that you reach out to your PCP or return to the ED for further evaluation.    In general, if you have specific questions about these instructions or your Huber stay in the next 48 hours, please call [phone] and ask for the geriatrics doctor on call. Let them know that you were just discharged from the [facility].     It is important to discuss healthcare issues with your primary care doctor at an upcoming visit, so that your doctor knows what is important to you and what you would or would not want done if you were to become very ill. For new symptoms or problems, call your primary care provider. If you believe that this is a life-threatening emergency, call 911 or go to the emergency room. Thank you for allowing [place] to participate in your care.          Follow Up instructions and Outpatient Referrals     Ambulatory referral to Home Health      Is this a [facility] Patient?: No    Physician to follow patient's care: PCP    Disciplines requested: Physical Therapy    Physical Therapy requested:  Home safety evaluation  Strengthening exercises  Evaluate and treat  Do you want ongoing co-management?: No    Care coordination required?: No    Call MD for:  difficulty breathing, headache or visual disturbances      Call MD for:  persistent nausea or vomiting      Call MD for:  severe uncontrolled pain      Call MD for:  temperature >38.5 Celsius      Discharge instructions          Appointments which have been scheduled for you    Apr 29, 2021  1:40 PM  (Arrive by 1:25 PM)  [facility] FOLLOW UP RETURN FM with Medical [facility] FOLLOW-UP  [facility] [place] [facility] REGION) [address]  [place] [address]  [phone]      May 23, 2021 12:15 PM  (Arrive by 12:00 PM)  RETURN PFT 15 with PFT 3  [facility] FUNCT [place] [facility] REGION) [address]  [place] [address]  [phone]      May 23, 2021 12:30 PM  (Arrive by 12:15 PM)  RETURN  PULM HYPERTENSION with Zannie Cove, MD  [facility] SPECIAL[facility] CL [place] [facility] REGION) [address]  [place] [address]  [phone]      Jun 23, 2021 10:30 AM  (Arrive by 10:15 AM)  NEW GENERAL with Sylvan Cheese, AGNP  [facility] REGION) [address]  [place] [address]  [phone]      Jul 10, 2021  4:10 PM  (Arrive by 3:55 PM)  RETURN VIDEO - OTHER with Artelia Laroche, MD  [facility] MEDIC[facility] [place] [facility]ION) [address]  [place] [address]  [phone]      Aug 29, 2021 10:00 AM  (Arrive by 9:45 AM)  RETURN HEART FAILURE [place] with Carin Hock, MD  [facility] [place] [facility] REGION) [address]  [place] [address]  [phone]             Method of DC Summary routing: Route function in epic

## 2021-04-22 DIAGNOSIS — Z09 Encounter for follow-up examination after completed treatment for conditions other than malignant neoplasm: Principal | ICD-10-CM

## 2021-04-23 ENCOUNTER — Ambulatory Visit: Admit: 2021-04-23 | Discharge: 2021-04-25 | Payer: MEDICARE

## 2021-04-23 DIAGNOSIS — D649 Anemia, unspecified: Principal | ICD-10-CM

## 2021-04-23 DIAGNOSIS — N189 Chronic kidney disease, unspecified: Principal | ICD-10-CM

## 2021-04-23 DIAGNOSIS — D631 Anemia in chronic kidney disease: Principal | ICD-10-CM

## 2021-04-23 DIAGNOSIS — I5032 Chronic diastolic (congestive) heart failure: Principal | ICD-10-CM

## 2021-04-23 LAB — COMPREHENSIVE METABOLIC PANEL
ALBUMIN: 3.3 g/dL — ABNORMAL LOW (ref 3.4–5.0)
ALKALINE PHOSPHATASE: 75 U/L (ref 46–116)
ALT (SGPT): 11 U/L (ref 10–49)
ANION GAP: 5 mmol/L (ref 5–14)
AST (SGOT): 14 U/L (ref <=34)
BILIRUBIN TOTAL: 0.4 mg/dL (ref 0.3–1.2)
BLOOD UREA NITROGEN: 32 mg/dL — ABNORMAL HIGH (ref 9–23)
BUN / CREAT RATIO: 21
CALCIUM: 8.7 mg/dL (ref 8.7–10.4)
CHLORIDE: 101 mmol/L (ref 98–107)
CO2: 30.9 mmol/L (ref 20.0–31.0)
CREATININE: 1.49 mg/dL — ABNORMAL HIGH
EGFR CKD-EPI AA FEMALE: 38 mL/min/{1.73_m2} — ABNORMAL LOW (ref >=60)
EGFR CKD-EPI NON-AA FEMALE: 33 mL/min/{1.73_m2} — ABNORMAL LOW (ref >=60)
GLUCOSE RANDOM: 185 mg/dL — ABNORMAL HIGH (ref 70–179)
POTASSIUM: 4.1 mmol/L (ref 3.4–4.8)
PROTEIN TOTAL: 7.5 g/dL (ref 5.7–8.2)
SODIUM: 137 mmol/L (ref 135–145)

## 2021-04-23 LAB — B-TYPE NATRIURETIC PEPTIDE: B-TYPE NATRIURETIC PEPTIDE: 566.69 pg/mL — ABNORMAL HIGH (ref <=100)

## 2021-04-23 LAB — BLOOD GAS CRITICAL CARE PANEL, VENOUS
BASE EXCESS VENOUS: 7.4 — ABNORMAL HIGH (ref -2.0–2.0)
CALCIUM IONIZED VENOUS (MG/DL): 4.7 mg/dL (ref 4.40–5.40)
GLUCOSE WHOLE BLOOD: 183 mg/dL
HCO3 VENOUS: 30 mmol/L — ABNORMAL HIGH (ref 22–27)
HEMOGLOBIN BLOOD GAS: 8.6 g/dL — ABNORMAL LOW (ref 12.00–16.00)
LACTATE BLOOD VENOUS: 0.8 mmol/L (ref 0.5–1.8)
O2 SATURATION VENOUS: 90.7 % — ABNORMAL HIGH (ref 40.0–85.0)
PCO2 VENOUS: 50 mmHg (ref 40–60)
PH VENOUS: 7.42 (ref 7.32–7.43)
PO2 VENOUS: 60 mmHg — ABNORMAL HIGH (ref 30–55)
POTASSIUM WHOLE BLOOD: 4.3 mmol/L (ref 3.4–4.6)
SODIUM WHOLE BLOOD: 141 mmol/L (ref 135–145)

## 2021-04-23 LAB — CBC W/ AUTO DIFF
BASOPHILS ABSOLUTE COUNT: 0.1 10*9/L (ref 0.0–0.1)
BASOPHILS RELATIVE PERCENT: 0.9 %
EOSINOPHILS ABSOLUTE COUNT: 0.2 10*9/L (ref 0.0–0.5)
EOSINOPHILS RELATIVE PERCENT: 1.7 %
HEMATOCRIT: 26.6 % — ABNORMAL LOW (ref 34.0–44.0)
HEMOGLOBIN: 8.2 g/dL — ABNORMAL LOW (ref 11.3–14.9)
LYMPHOCYTES ABSOLUTE COUNT: 1 10*9/L — ABNORMAL LOW (ref 1.1–3.6)
LYMPHOCYTES RELATIVE PERCENT: 9.2 %
MEAN CORPUSCULAR HEMOGLOBIN CONC: 31 g/dL — ABNORMAL LOW (ref 32.0–36.0)
MEAN CORPUSCULAR HEMOGLOBIN: 26.3 pg (ref 25.9–32.4)
MEAN CORPUSCULAR VOLUME: 84.9 fL (ref 77.6–95.7)
MEAN PLATELET VOLUME: 7.9 fL (ref 6.8–10.7)
MONOCYTES ABSOLUTE COUNT: 0.7 10*9/L (ref 0.3–0.8)
MONOCYTES RELATIVE PERCENT: 6.2 %
NEUTROPHILS ABSOLUTE COUNT: 9.2 10*9/L — ABNORMAL HIGH (ref 1.8–7.8)
NEUTROPHILS RELATIVE PERCENT: 82 %
NUCLEATED RED BLOOD CELLS: 0 /100{WBCs} (ref <=4)
PLATELET COUNT: 281 10*9/L (ref 150–450)
RED BLOOD CELL COUNT: 3.13 10*12/L — ABNORMAL LOW (ref 3.95–5.13)
RED CELL DISTRIBUTION WIDTH: 17.4 % — ABNORMAL HIGH (ref 12.2–15.2)
WBC ADJUSTED: 11.2 10*9/L (ref 3.6–11.2)

## 2021-04-23 LAB — HIGH SENSITIVITY TROPONIN I - SERIAL: HIGH SENSITIVITY TROPONIN I: 35 ng/L (ref <=34)

## 2021-04-23 LAB — MAGNESIUM: MAGNESIUM: 2.3 mg/dL (ref 1.6–2.6)

## 2021-04-23 LAB — HIGH SENSITIVITY TROPONIN I - 2 HOUR SERIAL
HIGH SENSITIVITY TROPONIN - DELTA (0-2H): 2 ng/L (ref <=7)
HIGH-SENSITIVITY TROPONIN I - 2 HOUR: 37 ng/L (ref <=34)

## 2021-04-23 LAB — HIGH SENSITIVITY TROPONIN I - SINGLE: HIGH SENSITIVITY TROPONIN I: 43 ng/L (ref <=34)

## 2021-04-23 MED ADMIN — furosemide (LASIX) injection 80 mg: 80 mg | INTRAVENOUS | @ 23:00:00 | Stop: 2021-04-23

## 2021-04-23 MED ADMIN — furosemide (LASIX) injection 80 mg: 80 mg | INTRAVENOUS | @ 14:00:00 | Stop: 2021-04-23

## 2021-04-23 MED ADMIN — fluticasone furoate-vilanteroL (BREO ELLIPTA) 100-25 mcg/dose inhaler 1 puff: 1 | RESPIRATORY_TRACT | @ 21:00:00

## 2021-04-23 MED ADMIN — umeclidinium (INCRUSE ELLIPTA) 62.5 mcg/actuation inhaler 1 puff: 1 | RESPIRATORY_TRACT | @ 21:00:00

## 2021-04-23 NOTE — Unmapped (Addendum)
Barbara Huber is a 80 y.o. female with a PMHx of TTR cardiac amyloid, COPD on 3L O2 at home, OSA, pHTN, T2DM, [name]3 who presented to [facility] with shortness of breath, DOE, orthopnea and abdominal edema for acute heart failure exacerbation.    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 Baseline weight between 210-215 pounds, weighed 228 on admission. Heart failure exacerbation likely multifactorial in nature including infection, IVF from abx / iron and inadequate diuretics after previous admission. Repeat echo without any worsening function (LVEF > 55%). Received [name] with Lasix and good result. Resolution of symptoms and EDW 211 lbs at discharged. Resuming home diuresis regimen of torsemide 40 mg daily with additional 40 mg if weight gain > 3 pounds over 1-2 days or worsening clinical symptoms.      COPD (on 3L at baseline)  Patient is satting well on home 3L. Exam not consistent with COPD exacerbation. She was maintained on her home therapies.      Recent Admission for Complicated UTI:  Patient was recently admitted for urosepsis and complicated UTI. She denied any recurrent urinary symptoms and was continued on Cefedinir 300 mg q12 (- 5/1). Discharged with resumption of previous cefpoxidime through 5/2.

## 2021-04-23 NOTE — Unmapped (Signed)
[facility] History and Physical    Assessment/Plan:    Principal Problem:    Dyspnea on exertion  Active Problems:    COPD (chronic obstructive pulmonary disease) (CMS-HCC)    Sleep apnea in adult    Essential hypertension    Type 2 diabetes mellitus with stage 3b chronic kidney disease, without long-term current use of insulin (CMS-HCC)    Anemia    Persistent atrial fibrillation (CMS-HCC)    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)    Cardiac amyloidosis (CMS-HCC)  Resolved Problems:    * No resolved hospital problems. *      Barbara Huber is a 80 y.o. female with PMHx of TTR cardiac amyloid, COPD on 3L O2 at home, OSA, pHTN, T2DM, [name]3 who presented to [facility] with shortness of breath, DOE, orthopnea and abdominal edema for acute heart failure exacerbation.     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 Follows with Dr Wonda Cheng. Most recent echo 09/30/2020 with EF >55%. Today weight 228, previously 215 on 04/04/21 (223 on recent discharge). Labs on admission notable for Cr 1.49 (downtrending from previously). BNP 566 (not significantly elevated from previous).  Crackles not auscultated on exam, but notable for abdominal and BLE edema concerning for CHF exacerbation. Trigger is unclear, though may be d/t changes in volume from IV antibiotics/iorn infusions during last admission. Could also be related to change in diet after discharge from the hospital vs inadequate home diuretic dose vs acute exacerbation in the setting of recent infection. Patient was given IV Lasix 80mg  in the ED, will reevaluate for redosing.  - IV lasix 80 mg daily  - Goal net negative -2L daily, redose lasix as necessary  - Daily BMP  - Daily weight checks (standing)  - Strict I/Os  - Hold home torsemide 40 mg, may need dose adjustment at discharge  - TTE ordered to reassess function  - Tafamidis (home med) 61 mg daily     COPD (on 3L at baseline)  Patient is satting well on home 3L. Symptoms above unlikely to be related to COPD exacerbation as continues on home oxygen requirement, no cough or wheezing. Will continue home therapies.   Charyl Dancer Ellipta for home Spiriva  - Breo Ellipta for home Advair  - Continue home singulair    Recent Admission for Complicated UTI:  Patient was recently admitted for urosepsis and complicated UTI. She was discharged on a 7 day course of cefpodoxime. She currently denies any recurrent urinary symptoms.  - Cefedinir 300 mg q12 (- 5/1)    Chronic Medical Conditions:   T2DM (not on insulin): Last A1c 5.5. No SSI, will monitor daily BMP  Chronic Pain: Home oxycontin 10 mg q12  OSA: Home CPAP  Afib:??Cardioverted 01/09/2021. Continue??home eliquis 5 mg BID, amiodarone 200 mg daily  Peripheral Neuropathy: Continue home gabapentin 300 TID  Iron Deficiency Anemia:??Patient with known iron deficiency anemia with hemoglobin 8.5 patient received 5 doses of 125 mg IV iron infusions during previous admission. Was not discharged with PO supplementation 2/2 previous puritis reaction. Advised to discuss with PCP. Daily CBC to trend      Cognitive assessment:  CAM (Confusion Assessment Method): Negative for evidence of delirium    Other cognitive assessment: None    FEN:  Fluids: None  Electrolytes: None  Nutrition: Heart Healthy    Prophylaxis:  - DVT:Pt already on theraputic  anticoagulation    Advance Care Planning & Code Status: Full Code    Surrogate decision maker: Barbara Huber, daughter     Disposition: Admit to med A for [name]     Emergency Contact:  Extended Emergency Contact Information  Primary Emergency Contact: Huber,Barbara  Address: [address]           [place], [place] [address] [place]  Home Phone: [phone]  Work Phone: [phone]  Mobile Phone: [phone]  Relation: Daughter    ___________________________________________________________________    Chief Complaint:  Chief Complaint   Patient presents with   ??? Shortness of Breath     Dyspnea on exertion    PCP: Jacquiline Doe, MD    HPI:  Barbara Huber is a 80 y.o. female withPMHx of TTR cardiac amyloid, COPD on 3L O2 at home, OSA, pHTN, T2DM, [name]3 who presented to [facility] with shortness of breath, dyspnea on exertion, and abdominal and ankle swelling most concerning for acute heart failure exacerbation.     Patient reports that she noticed her ankles are starting to swell prior to most recent discharge on 4/25.  Noted that weight has increased significantly from previous baseline over the last several months approximately 212-215 lbs to 123 at discharge.  And received significant fluids with IV medications while admitted.  Patient understands shortness of breath with exertion starting yesterday and more increased work of breathing this morning.  Last several nights with orthopnea and PND, despite using CPAP.  Most significantly feeling abdomen is distended and ankles continue with increased swelling compared to baseline.  Did not take extra dose of torsemide but found little relief her symptoms.  Reports she has not had exacerbation of her heart failure in quite some time, but she reports was likely prior to cardioversion in fall 2021.  However, came to emergency department today because knows that she will likely need [name] in order to remove fluid and improve her edema and breathing.      Denies any chest pain, cough, fevers, chills.  No changes in urinary symptoms    In ED, afebrile, HDS and on home 3L O2. Labs with CBC stable from discharge, WBC wnl. BMP stable to improved with improved Cr 1.49 (downtrending from discharge).  Electrolytes within normal limits.  VBG 7.42 - 50, Lactate 0.8. Viral Panel negative. Trop minimally elevate 35 > 37. CXR increased interstitial edema versus inflammation or atypical infection.    Allergies:  Nitrofurantoin and Lipitor [atorvastatin]    Medications:   Prior to Admission medications    Medication Dose, Route, Frequency   ACCU-CHEK AVIVA PLUS TEST STRP Strp USE TO CHECK BLOOD SUGAR 3 TIMES A DAY BEFORE MEALS   acetaminophen (TYLENOL) 500 MG tablet 1,000 mg, Oral, Daily PRN   albuterol HFA 90 mcg/actuation inhaler 2 puffs, Inhalation, Every 8 hours PRN   amiodarone (PACERONE) 200 MG tablet 200 mg, Oral, Daily (standard)   apixaban (ELIQUIS) 5 mg Tab 5 mg, Oral, 2 times a day (standard)   cefpodoxime (VANTIN) 200 MG tablet 200 mg, Oral, 2 times a day (standard)   estradioL (ESTRACE) 0.01 % (0.1 mg/gram) vaginal cream INSERT ONE GRAM VAGINALLY AT BEDTIME   fluticasone propion-salmeteroL (ADVAIR HFA) 115-21 mcg/actuation inhaler 2 puffs, Inhalation, 2 times a day (standard)   gabapentin (NEURONTIN) 300 MG capsule 300 mg, Oral, 3 times a day (standard)   lancets Misc 1 each, Miscellaneous, Daily (standard), Accu Check   metFORMIN (GLUCOPHAGE) 500 MG tablet  500 mg, Oral, 2 times a day with meals   montelukast (SINGULAIR) 10 mg tablet 10 mg, Oral, Daily   NARCAN 4 mg/actuation nasal spray 1 spray, Once as needed  Patient not taking: Reported on 02/04/2021   oxyCODONE (OXYCONTIN) 10 mg TR12 12 hr crush resistant ER/CR tablet 10 mg, Oral, Every 12 hours   OXYGEN-AIR DELIVERY SYSTEMS MISC 5 L, Miscellaneous   rosuvastatin (CRESTOR) 5 MG tablet 5 mg, Oral, Every other day   spironolactone (ALDACTONE) 50 MG tablet 50 mg, Oral, Daily (standard)   tafamidis 61 mg cap Take 1 capsule (61 mg) by mouth daily.   tiotropium bromide (SPIRIVA RESPIMAT) 2.5 mcg/actuation inhalation mist 2 puffs, Inhalation, Daily (standard)   torsemide (DEMADEX) 20 MG tablet Take 2 tablets (40 mg total) by mouth daily. May also take 2 tablets (40 mg total) daily as needed (weight gain).   varicella-zoster [id number], PF, (SHINGRIX, PF,) 50 mcg/0.5 mL SusR injection 0.5 mL, Intramuscular       Medical History:  Past Medical History:   Diagnosis Date   ??? Acute kidney injury superimposed on chronic kidney disease (CMS-HCC) 10/11/2020   ??? Acute on chronic diastolic (congestive) heart failure (CMS-HCC) 08/23/2020   ??? Arthritis    ??? Calculus of kidney    ??? Calculus of ureter    ??? CHF (congestive heart failure) (CMS-HCC)    ??? Chronic atrial fibrillation (CMS-HCC) 07/20/2019   ??? COPD (chronic obstructive pulmonary disease) (CMS-HCC)    ??? Diabetes (CMS-HCC)    ??? Gangrenous cholecystitis 10/11/2020   ??? GERD (gastroesophageal reflux disease)    ??? Hydronephrosis    ??? Hypertension    ??? Hyponatremia 10/11/2020   ??? Intermediate coronary syndrome (CMS-HCC) 03/13/2014   ??? Lower extremity edema 09/28/2020   ??? Lumbar stenosis    ??? Microscopic hematuria    ??? Nausea alone    ??? Neuropathy    ??? Nocturia    ??? Other chronic cystitis    ??? Pulmonary hypertension (CMS-HCC)    ??? Renal colic    ??? Sleep apnea    ??? Unstable angina pectoris (CMS-HCC) 03/13/2014       Surgical History:  Past Surgical History:   Procedure Laterality Date   ??? BACK SURGERY  1995   ??? CARPAL TUNNEL RELEASE Left 2014   ??? HYSTERECTOMY  1971   ??? IR INSERT CHOLECYSTOSMY TUBE PERCUTANEOUS  10/02/2020    IR INSERT CHOLECYSTOSMY TUBE PERCUTANEOUS 10/02/2020 Braulio Conte, MD IMG VIR H&V [facility]   ??? LUMBAR DISC SURGERY     ??? PR REMOVAL GALLBLADDER N/A 10/06/2020    Procedure: CHOLECYSTECTOMY;  Surgeon: Katherina Mires, MD;  Location: MAIN OR [facility];  Service: Trauma   ??? PR RIGHT HEART CATH O2 SATURATION & CARDIAC OUTPUT N/A 09/30/2020    Procedure: Right Heart Catheterization;  Surgeon: Neal Dy, MD;  Location: [facility] CATH;  Service: Cardiology   ??? PR RIGHT HEART CATH O2 SATURATION & CARDIAC OUTPUT N/A 01/09/2021    Procedure: Right Heart Catheterization;  Surgeon: Lesle Reek, MD;  Location: [facility] CATH;  Service: Cardiology       Social History:  Social History     Socioeconomic History   ??? Marital status: Widowed     Spouse name: Not on file   ??? Number of children: Not on file   ??? Years of education: Not on file   ??? Highest education level: Not on file   Occupational History   ??? Occupation: retired  Tobacco Use   ??? Smoking status: Former Smoker     Packs/day: 0.33     Types: Cigarettes   ??? Smokeless tobacco: Never Used   ??? Tobacco comment: Quit a few years ago   Vaping Use   ??? Vaping Use: Never used   Substance and Sexual Activity   ??? Alcohol use: No   ??? Drug use: No   ??? Sexual activity: Not on file   Other Topics Concern   ??? Exercise No   ??? Living Situation Not Asked   Social History Narrative    Previously worked in Designer, fashion/clothing, retired.  Daughter is Economist who works at [employer].  Her niece is Selena Batten, [facility] Anesthesia Tech.  Lives in [place] with daughter and son-in-law.     Social Determinants of Health     Financial Resource Strain: Low Risk    ??? Difficulty of Paying Living Expenses: Not very hard   Food Insecurity: No Food Insecurity   ??? Worried About Running Out of Food in the Last Year: Never true   ??? Ran Out of Food in the Last Year: Never true   Transportation Needs: No Transportation Needs   ??? Lack of Transportation (Medical): No   ??? Lack of Transportation (Non-Medical): No   Physical Activity: Not on file   Stress: Not on file   Social Connections: Not on file       Living situation:   Patient lives in own home, with family    ADLs:  Feeding: Independent  Dressing: Independent  Ambulation: Requires Assistance  Toileting: Independent  Bathing: Independent    IADLs:  Using the telephone: Independent  Shopping: Dependent  Meal preparation: Requires Assistance  Medication management: Independent  Managing finances: Dependent  Housework: Requires Presenter, broadcasting (driving or navigating public transit): Dependent     Assistive devices: Walker    Family History:  Family History   Problem Relation Age of Onset   ??? Hypertension Mother    ??? Cancer Father         COLON CANCER   ??? Anesthesia problems Neg Hx    ??? Broken bones Neg Hx    ??? Clotting disorder Neg Hx    ??? Collagen disease Neg Hx    ??? Diabetes Neg Hx    ??? Dislocations Neg Hx    ??? Fibromyalgia Neg Hx    ??? Gout Neg Hx    ??? Hemophilia Neg Hx    ??? Osteoporosis Neg Hx    ??? Rheumatologic disease Neg Hx    ??? Scoliosis Neg Hx    ??? Severe sprains Neg Hx    ??? Sickle cell anemia Neg Hx    ??? Spinal Compression Fracture Neg Hx    ??? GU problems Neg Hx    ??? Kidney cancer Neg Hx    ??? Prostate cancer Neg Hx        Review of Systems:  10 systems reviewed and are negative unless otherwise mentioned in HPI    Labs/Studies:  Labs and Studies from the last 24hrs per EMR and Reviewed    Physical Exam:  Temp:  [35.8 ??C (96.4 ??F)-36.7 ??C (98 ??F)] 36.7 ??C (98 ??F)  Heart Rate:  [80] 80  SpO2 Pulse:  [64-69] 64  Resp:  [20] 20  BP: (129-152)/(63-69) 133/64  SpO2:  [93 %-97 %] 95 %    Wt Readings from Last 6 Encounters:   04/23/21 (!) 103.4 kg (228 lb)   04/21/21 Marland Kitchen)  101.4 kg (223 lb 8 oz)   04/04/21 97.6 kg (215 lb 3.2 oz)   03/21/21 96.2 kg (212 lb)   02/26/21 96.6 kg (213 lb)   02/21/21 97.9 kg (215 lb 12.8 oz)       GEN: NAD, sitting up in bed,   EYES: EOMI,   ENT: MMM  CV: RRR, no murmurs appreciated  PULM: CTAB, no wheezing or crackles, NWOB on home 3L at rest   ABD: +distended, Non-tender, +BS  EXT: 1+ pitting edema above ankle,  NEURO: No FND, CN grossly intact  PSYCH: A+Ox3, appropriate  GU: No CVA tenderness  MSK: No spinal tenderness    Ethelda Chick, MS4    I have verified all student documentation or findings.  I have personally performed or re-performed the physical exam and medical decision making:  Vernell Barrier, MD PhD   PGY-1 Internal Medicine

## 2021-04-23 NOTE — Unmapped (Incomplete)
ron Deficiency in Advanced Heart Failure    Barbara Huber [phone]) is a 51 Female with [name] being assessed for iron deficiency to optimize heart failure therapy.    Iron panel:  Iron-  Feratin  Hgb      H/H: @RESUFAST (HGB)@@RESUFAST (HCT)@    Contraindications to IV Iron Therapy: none    Previous IV Iron Therapy:  Feraheme not covered by plan    Estimated Iron Deficit (per Ganzoni Formula calculated on ***): ***mg    Plan:   Initiate:   1. Venofer (iron sucrose) 200 mg x 6 doses  2. Obtain repeat ferritin/iron profile 4-6 weeks following IV iron     Rafael Bihari, PharmD, BCACP, CPP  Clinical Pharmacist Practitioner

## 2021-04-23 NOTE — Unmapped (Signed)
Emergency Department Provider Note  [facility]      MDM & Course   [name] is a 80 y.o. female who presents for shortness of breath since waking the morning with dyspnea on exertion. The patient was discharged after a 3 day admission to [facility] for urosepsis, polypharmacy and Iron deficiency. On exam, slight crackles to lower left lung field. No pedal edema. Possible trace swelling to ankles. No calf tenderness. Plan for CXR, ECG, magnesium, BNP, rapid Flu/RSV/COVID, troponin and basic labs. Will give 80mg  furosemide injection.     Echocardiogram W Colorflow Spectral Doppler   Final Result      XR Chest 1 view Portable   Final Result      Increased interstitial edema versus inflammation or atypical infection.        Planned admission for CHF exacerbation and continued diuresis.    Additional MDM   I independently visualized the EKG tracing.   I independently visualized the radiology images.   I reviewed the patient's prior medical records.     Final diagnoses:   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) (Primary)   Complicated UTI (urinary tract infection)     History & ROS     Barbara Huber is a 80 y.o. female with a PMH of TTR Cardiac amyloid, COPD on 3L O2 at home, OSA, pHTN, [name] (last echo 10/21 EF > 55%), T2DM, [name]3 who presents to the ED for evaluation of shortness of breath. The patient reports she woke up this morning with shortness of breath. No associated chest pain or wheezing. She endorses difficulty with ambulating. The patient took her albuterol and Spiriva mist this morning. She went to bed last night feeling alright although she felt as though she was fluid overloaded. She took extra doses of Torsemide yesterday for these symptoms. She normally takes 40 mg of Torsemide. She also feels as though her abdomen is swollen, which is normally where she gets swelling.  No fevers, chills, or infectious symptoms.    Per chart review, the patient was admitted to [facility] from 04/18/2021-04/21/2021 for urosepsis, polypharmacy, and Iron deficiency. The patient had a WBC count of 14.7 and UA notable for trace LE, 50 WBCs, and few bacteria concerning for urosepsis. She was treated with IV ceftriaxone and discharged with 7 day course of cefpodoxime 200mg  BID. Her Hgb was found to be 8.5 and iron <20 on admission. She received 5 doses of 125 mg IV iron while admitted. She was scheduled to receive Fe infusions in the outpatient setting.     CONST: See above  EYES: Negative for eye redness.  ENT: Negative for sore throat.  CARD: Negative for chest pain.  RESP: Positive for shortness of breath.  GI: Negative for abdominal pain, vomiting or diarrhea.  GU: Negative for dysuria.  MSK: Negative for extremity pain  SKIN: Negative for rash.  NEURO: Negative for headaches, focal weakness or numbness.    Physical Exam   BP 131/63  - Pulse 76  - Temp 36.3 ??C (97.3 ??F) (Temporal)  - Resp 18  - Ht 162.6 cm (5' 4)  - Wt 96 kg (211 lb 11.2 oz)  - SpO2 93%  - BMI 36.34 kg/m??   CONST:  Alert and oriented. Well appearing and in no distress.  EYES: Normal conjunctiva, PERRL  ENT: Mucous membranes are moist. Clear pharynx.  HEME/LYMPH: No pallor  CV: S1S2 RRR.   RESP:  Slight crackles in lower left lung field.  Normal respiratory effort. No wheezes   GI: Soft, non-tender, non-distended.   MSK: No pedal edema. Possible trace swelling to ankles. No calf tenderness.   NEURO: Normal speech and language. No gross focal neurologic deficits are appreciated.  SKIN: Skin is warm, dry and intact. No rash noted.      Additional History     Past Medical History:   Diagnosis Date   ??? Acute kidney injury superimposed on chronic kidney disease (CMS-HCC) 10/11/2020   ??? Acute on chronic diastolic (congestive) heart failure (CMS-HCC) 08/23/2020   ??? Arthritis    ??? Calculus of kidney    ??? Calculus of ureter    ??? CHF (congestive heart failure) (CMS-HCC)    ??? Chronic atrial fibrillation (CMS-HCC) 07/20/2019   ??? COPD (chronic obstructive pulmonary disease) (CMS-HCC)    ??? Diabetes (CMS-HCC)    ??? Gangrenous cholecystitis 10/11/2020   ??? GERD (gastroesophageal reflux disease)    ??? Hydronephrosis    ??? Hypertension    ??? Hyponatremia 10/11/2020   ??? Intermediate coronary syndrome (CMS-HCC) 03/13/2014   ??? Lower extremity edema 09/28/2020   ??? Lumbar stenosis    ??? Microscopic hematuria    ??? Nausea alone    ??? Neuropathy    ??? Nocturia    ??? Other chronic cystitis    ??? Pulmonary hypertension (CMS-HCC)    ??? Renal colic    ??? Sleep apnea    ??? Unstable angina pectoris (CMS-HCC) 03/13/2014     Past Surgical History:   Procedure Laterality Date   ??? BACK SURGERY  1995   ??? CARPAL TUNNEL RELEASE Left 2014   ??? HYSTERECTOMY  1971   ??? IR INSERT CHOLECYSTOSMY TUBE PERCUTANEOUS  10/02/2020    IR INSERT CHOLECYSTOSMY TUBE PERCUTANEOUS 10/02/2020 Barbara Conte, MD IMG VIR H&V [facility]   ??? LUMBAR DISC SURGERY     ??? PR REMOVAL GALLBLADDER N/A 10/06/2020    Procedure: CHOLECYSTECTOMY;  Surgeon: Barbara Mires, MD;  Location: MAIN OR [facility];  Service: Trauma   ??? PR RIGHT HEART CATH O2 SATURATION & CARDIAC OUTPUT N/A 09/30/2020    Procedure: Right Heart Catheterization;  Surgeon: Barbara Dy, MD;  Location: [facility] CATH;  Service: Cardiology   ??? PR RIGHT HEART CATH O2 SATURATION & CARDIAC OUTPUT N/A 01/09/2021    Procedure: Right Heart Catheterization;  Surgeon: Barbara Reek, MD;  Location: [facility] CATH;  Service: Cardiology     Family History   Problem Relation Age of Onset   ??? Hypertension Mother    ??? Cancer Father         COLON CANCER   ??? Anesthesia problems Neg Hx    ??? Broken bones Neg Hx    ??? Clotting disorder Neg Hx    ??? Collagen disease Neg Hx    ??? Diabetes Neg Hx    ??? Dislocations Neg Hx    ??? Fibromyalgia Neg Hx    ??? Gout Neg Hx    ??? Hemophilia Neg Hx    ??? Osteoporosis Neg Hx    ??? Rheumatologic disease Neg Hx    ??? Scoliosis Neg Hx    ??? Severe sprains Neg Hx    ??? Sickle cell anemia Neg Hx    ??? Spinal Compression Fracture Neg Hx    ??? GU problems Neg Hx    ??? Kidney cancer Neg Hx    ??? Prostate cancer Neg Hx      Allergies  Nitrofurantoin and Lipitor [atorvastatin]  Social History  Social History     Tobacco Use   ??? Smoking status: Former Smoker     Packs/day: 0.33     Types: Cigarettes   ??? Smokeless tobacco: Never Used   ??? Tobacco comment: Quit a few years ago   Vaping Use   ??? Vaping Use: Never used   Substance Use Topics   ??? Alcohol use: No   ??? Drug use: No     Attestation     Documentation assistance was provided by Michaelle Birks, Scribe, on April 23, 2021 at 09:30 for Sherlene Shams, MD, MPH.     Documentation assistance was provided by the scribe in my presence.  The documentation recorded by the scribe has been reviewed by me and accurately reflects the services I personally performed.     Laurance Flatten, MD  04/28/21 2149

## 2021-04-23 NOTE — Unmapped (Signed)
This NA walked this patient in Hallway   Start pulse ox 3L [place] 91%  Ending pulse ox 3L [place] 86%

## 2021-04-23 NOTE — Unmapped (Signed)
Pt stating she was discharged on the 25th. Pt stating she is full of fluid. Pt stating she can't breathe. Pt stating her hands are swollen. Pt in NAD at this time. +DTS

## 2021-04-24 DIAGNOSIS — I5032 Chronic diastolic (congestive) heart failure: Principal | ICD-10-CM

## 2021-04-24 LAB — CBC
HEMATOCRIT: 26.4 % — ABNORMAL LOW (ref 34.0–44.0)
HEMOGLOBIN: 8.5 g/dL — ABNORMAL LOW (ref 11.3–14.9)
MEAN CORPUSCULAR HEMOGLOBIN CONC: 32.1 g/dL (ref 32.0–36.0)
MEAN CORPUSCULAR HEMOGLOBIN: 27 pg (ref 25.9–32.4)
MEAN CORPUSCULAR VOLUME: 84 fL (ref 77.6–95.7)
MEAN PLATELET VOLUME: 8.2 fL (ref 6.8–10.7)
PLATELET COUNT: 302 10*9/L (ref 150–450)
RED BLOOD CELL COUNT: 3.14 10*12/L — ABNORMAL LOW (ref 3.95–5.13)
RED CELL DISTRIBUTION WIDTH: 18.2 % — ABNORMAL HIGH (ref 12.2–15.2)
WBC ADJUSTED: 11 10*9/L (ref 3.6–11.2)

## 2021-04-24 LAB — BASIC METABOLIC PANEL
ANION GAP: 6 mmol/L (ref 5–14)
BLOOD UREA NITROGEN: 31 mg/dL — ABNORMAL HIGH (ref 9–23)
BUN / CREAT RATIO: 22
CALCIUM: 8.9 mg/dL (ref 8.7–10.4)
CHLORIDE: 100 mmol/L (ref 98–107)
CO2: 34.3 mmol/L — ABNORMAL HIGH (ref 20.0–31.0)
CREATININE: 1.39 mg/dL — ABNORMAL HIGH
EGFR CKD-EPI AA FEMALE: 42 mL/min/{1.73_m2} — ABNORMAL LOW (ref >=60)
EGFR CKD-EPI NON-AA FEMALE: 36 mL/min/{1.73_m2} — ABNORMAL LOW (ref >=60)
GLUCOSE RANDOM: 117 mg/dL (ref 70–179)
POTASSIUM: 4 mmol/L (ref 3.4–4.8)
SODIUM: 140 mmol/L (ref 135–145)

## 2021-04-24 LAB — HIGH SENSITIVITY TROPONIN I - SINGLE: HIGH SENSITIVITY TROPONIN I: 39 ng/L (ref <=34)

## 2021-04-24 LAB — MAGNESIUM: MAGNESIUM: 2.1 mg/dL (ref 1.6–2.6)

## 2021-04-24 MED ADMIN — montelukast (SINGULAIR) tablet 10 mg: 10.0 mg | ORAL | @ 13:00:00

## 2021-04-24 MED ADMIN — apixaban (ELIQUIS) tablet 5 mg: 5 mg | ORAL | @ 13:00:00

## 2021-04-24 MED ADMIN — gabapentin (NEURONTIN) capsule 300 mg: 300 mg | ORAL | @ 13:00:00

## 2021-04-24 MED ADMIN — gabapentin (NEURONTIN) capsule 300 mg: 300 mg | ORAL

## 2021-04-24 MED ADMIN — **Patient Own Med** tafamidis cap 61 mg: 61 mg | ORAL | @ 19:00:00

## 2021-04-24 MED ADMIN — furosemide (LASIX) injection 80 mg: 80 mg | INTRAVENOUS | @ 19:00:00 | Stop: 2021-04-24

## 2021-04-24 MED ADMIN — amiodarone (PACERONE) tablet 200 mg: 200 mg | ORAL | @ 13:00:00

## 2021-04-24 MED ADMIN — cefdinir (OMNICEF) capsule 300 mg: 300 mg | ORAL | @ 13:00:00 | Stop: 2021-04-27

## 2021-04-24 MED ADMIN — spironolactone (ALDACTONE) tablet 50 mg: 50 mg | ORAL | @ 13:00:00

## 2021-04-24 MED ADMIN — furosemide (LASIX) tablet 80 mg: 80 mg | ORAL | @ 13:00:00 | Stop: 2021-04-24

## 2021-04-24 MED ADMIN — cefdinir (OMNICEF) capsule 300 mg: 300 mg | ORAL | Stop: 2021-04-27

## 2021-04-24 MED ADMIN — apixaban (ELIQUIS) tablet 5 mg: 5 mg | ORAL

## 2021-04-24 MED ADMIN — umeclidinium (INCRUSE ELLIPTA) 62.5 mcg/actuation inhaler 1 puff: 1 | RESPIRATORY_TRACT | @ 13:00:00

## 2021-04-24 MED ADMIN — oxyCODONE (OxyCONTIN) 12 hr crush resistant ER/CR tablet 10 mg: 10 mg | ORAL | Stop: 2021-05-07

## 2021-04-24 MED ADMIN — polyethylene glycol (MIRALAX) packet 17 g: 17 g | ORAL | @ 13:00:00

## 2021-04-24 MED ADMIN — gabapentin (NEURONTIN) capsule 300 mg: 300 mg | ORAL | @ 18:00:00

## 2021-04-24 MED ADMIN — oxyCODONE (OxyCONTIN) 12 hr crush resistant ER/CR tablet 10 mg: 10 mg | ORAL | @ 13:00:00 | Stop: 2021-05-07

## 2021-04-24 MED ADMIN — fluticasone furoate-vilanteroL (BREO ELLIPTA) 100-25 mcg/dose inhaler 1 puff: 1 | RESPIRATORY_TRACT | @ 13:00:00

## 2021-04-24 MED ADMIN — furosemide (LASIX) injection 40 mg: 40 mg | INTRAVENOUS | @ 14:00:00 | Stop: 2021-04-24

## 2021-04-24 NOTE — Unmapped (Signed)
Problem: Adult Inpatient Plan of Care  Goal: Plan of Care Review  Outcome: Progressing  Goal: Patient-Specific Goal (Individualized)  Outcome: Progressing  Goal: Absence of Hospital-Acquired Illness or Injury  Outcome: Progressing  Intervention: Identify and Manage Fall Risk  Recent Flowsheet Documentation  Taken 04/23/2021 2000 by Myles Lipps, RN  Safety Interventions:   low bed   fall reduction program maintained  Goal: Optimal Comfort and Wellbeing  Outcome: Progressing  Goal: Readiness for Transition of Care  Outcome: Progressing  Goal: Rounds/Family Conference  Outcome: Progressing   A/O. VSS. NAD. No c/o SOB. No c/o pain. Purewick in place.

## 2021-04-24 NOTE — Unmapped (Signed)
Care Management  Initial Transition Planning Assessment  CM met with patient in pt room.  Pt/visitors were not wearing hospital provided masks for the duration of the interaction with CM.   CM was wearing hospital provided surgical mask.  CM was not within 6 foot of the patient/visitors during this interaction.   Type of Residence: Mailing Address:  [address] [place] [address]  Contacts: Accompanied by: Alone  Patient Phone Number: [phone] (home)           Medical Provider(s): Jacquiline Doe, MD  Reason for Admission: Admitting Diagnosis:  No admission diagnoses are documented for this encounter.  Past Medical History:   has a past medical history of Acute kidney injury superimposed on chronic kidney disease (CMS-HCC) (10/11/2020), Acute on chronic diastolic (congestive) heart failure (CMS-HCC) (08/23/2020), Arthritis, Calculus of kidney, Calculus of ureter, CHF (congestive heart failure) (CMS-HCC), Chronic atrial fibrillation (CMS-HCC) (07/20/2019), COPD (chronic obstructive pulmonary disease) (CMS-HCC), Diabetes (CMS-HCC), Gangrenous cholecystitis (10/11/2020), GERD (gastroesophageal reflux disease), Hydronephrosis, Hypertension, Hyponatremia (10/11/2020), Intermediate coronary syndrome (CMS-HCC) (03/13/2014), Lower extremity edema (09/28/2020), Lumbar stenosis, Microscopic hematuria, Nausea alone, Neuropathy, Nocturia, Other chronic cystitis, Pulmonary hypertension (CMS-HCC), Renal colic, Sleep apnea, and Unstable angina pectoris (CMS-HCC) (03/13/2014).  Past Surgical History:   has a past surgical history that includes Carpal tunnel release (Left, 2014); Hysterectomy (1971); Back surgery (1995); Lumbar disc surgery; IR Insert Cholecystosmy Tube Percutaneous (10/02/2020); pr removal gallbladder (N/A, 10/06/2020); pr right heart cath o2 saturation & cardiac output (N/A, 09/30/2020); and pr right heart cath o2 saturation & cardiac output (N/A, 01/09/2021).   Previous admit date: 04/18/2021    Primary Insurance- Payor: HUMANA MEDICARE ADV / Plan: HUMANA GOLD PLUS HMO / Product Type: *No Product type* /   Secondary Insurance ??? Secondary Insurance  MEDICAID [place]  Prescription Coverage ??? Medicare  Preferred Pharmacy - [facility] [place] [address]  [facility] [place]  - [address]  [facility] [place] [facility]    Transportation home: Museum/gallery exhibitions officer assessed the patient by : In person interview with patient, Medical record review, Discussion with Clinical Care team  Orientation Level: Oriented X4  Functional level prior to admission: Partially Assisted  Who provides care at home?: Family member (Pt lives with daughter and son-in-law)  Reason for referral: Discharge Planning, Home Health    Contact/Decision Maker  Extended Emergency Contact Information  Primary Emergency Contact: [name],[name]  Address: [address]           [place], [place] [address] [place]  Home Phone: [phone]  Work Phone: [phone]  Mobile Phone: [phone]  Relation: Daughter    Legal Next of Kin / Guardian / POA / Advance Directives     HCDM (patient stated preference): [name],[name] - Daughter - [phone]    Advance Directive (Medical Treatment)  Does patient have an advance directive covering medical treatment?: Patient has advance directive covering medical treatment, copy in chart.  Reason patient does not have an advance directive covering medical treatment:: Patient does not wish to complete one at this time.    Health Care Decision Maker [HCDM] (Medical & Mental Health Treatment)  Healthcare Decision Maker: Patient does not wish to appoint a Health Care Decision Maker at this time  Information offered on HCDM, Medical &  Mental Health advance directives:: Patient declined information.         Patient Information  Lives with: Family members (Pt lives with daughter and son-in-law)    Type of Residence: Private residence             Support Systems/Concerns: Family Members    Responsibilities/Dependents at home?: No    Home Care services in place prior to admission?: No                  Equipment Currently Used at Home: cane, straight, other (see comments) (Pt uses rollator at home)       Currently receiving outpatient dialysis?: No       Financial Information       Need for financial assistance?: No       Social Determinants of Health     Tobacco Use: Medium Risk   ??? Smoking Tobacco Use: Former Smoker   ??? Smokeless Tobacco Use: Never Used   Alcohol Use: Not At Risk   ??? How often do you have a drink containing alcohol?: Never   ??? How many drinks containing alcohol do you have on a typical day when you are drinking?: 1 - 2   ??? How often do you have 5 or more drinks on one occasion?: Never   Financial Resource Strain: Low Risk    ??? Difficulty of Paying Living Expenses: Not very hard   Food Insecurity: No Food Insecurity   ??? Worried About Running Out of Food in the Last Year: Never true   ??? Ran Out of Food in the Last Year: Never true   Transportation Needs: No Transportation Needs   ??? Lack of Transportation (Medical): No   ??? Lack of Transportation (Non-Medical): No   Physical Activity: Not on file   Stress: Not on file   Social Connections: Not on file   Intimate Partner Violence: Not on file   Depression: Not at risk   ??? PHQ-2 Score: 1   Housing/Utilities: Low Risk    ??? Within the past 12 months, have you ever stayed: outside, in a car, in a tent, in an overnight shelter, or temporarily in someone else's home (i.e. couch-surfing)?: No   ??? Are you worried about losing your housing?: No   ??? Within the past 12 months, have you been unable to get utilities (heat, electricity) when it was really needed?: No   Substance Use: Low Risk    ??? Taken prescription drugs for non-medical reasons: Never   ??? Taken illegal drugs: Never   ??? Patient indicated they have taken drugs in the past year for non-medical reasons: Yes, [positive answer(s)]: Not on file   Health Literacy: Medium Risk   ??? : Sometimes       Discharge Needs Assessment  Concerns to be Addressed: discharge planning    Clinical Risk Factors: > 65, Multiple Diagnoses (Chronic)    Barriers to taking medications: No    Prior overnight hospital stay or [name] in last 90 days: Yes    Readmission Within the Last 30 Days: no previous admission in last 30 days         Anticipated Changes Related to Illness: inability to care for self    Equipment Needed After Discharge: none    Discharge Facility/Level of Care Needs:      Readmission  Risk of Unplanned Readmission Score: UNPLANNED READMISSION SCORE: 38%  Predictive Model Details  38% (High)  Factor Value    Calculated 04/24/2021 08:03 29% Number of ED visits in last six months 8    [facility] Risk of Unplanned Readmission Model 15% Number of active Rx orders 33     13% Number of hospitalizations in last year 4     6% ECG/EKG order present in last 6 months     5% Latest BUN high (32 mg/dL)     5% Encounter of ten days or longer in last year present     4% Imaging order present in last 6 months     4% Age 20     4% Latest hemoglobin low (8.5 g/dL)     3% Charlson Comorbidity Index 4     3% Diagnosis of deficiency anemia present     3% Active anticoagulant Rx order present     3% Latest creatinine high (1.49 mg/dL)     3% Diagnosis of renal failure present     1% Future appointment scheduled     1% Current length of stay 0.739 days      Readmitted Within the Last 30 Days? (No if blank) Yes  Patient at risk for readmission?: Yes    Discharge Plan  Screen findings are: Discharge planning needs identified or anticipated (Comment). [facility])    Expected Discharge Date:     Expected Transfer from Critical Care:  (N/A)    Quality data for continuing care services shared with patient and/or representative?: Yes  Patient and/or family were provided with choice of facilities / services that are available and appropriate to meet post hospital care needs?: Yes       Initial Assessment complete?: Yes  Mauri Brooklyn  April 24, 2021 11:47 AM

## 2021-04-24 NOTE — Unmapped (Signed)
[name]    Assessment & Plan:   Barbara Huber is a 80 y.o. female with a PMHx of TTR cardiac amyloid, COPD on 3L O2 at home, OSA, pHTN, T2DM, [name]3 who presented to [facility] with shortness of breath, DOE, orthopnea and abdominal edema for acute heart failure exacerbation.    Principal Problem:    Dyspnea on exertion  Active Problems:    COPD (chronic obstructive pulmonary disease) (CMS-HCC)    Sleep apnea in adult    Essential hypertension    Type 2 diabetes mellitus with stage 3b chronic kidney disease, without long-term current use of insulin (CMS-HCC)    Anemia    Persistent atrial fibrillation (CMS-HCC)    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)    Cardiac amyloidosis (CMS-HCC)  Resolved Problems:    * No resolved hospital problems. 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 Likely multifactorial in nature including infection, IVF from abx / iron and inadequate diuretics after previous admission. Repeat echo without any worsening function (LVEF > 55%)Today's weight is 214, down from last known weight of 223 on 4/25 s/p IV Lasix 80 mg x2. Her volume status is improving clinically, however continues to have persistent abdominal and LE edema after IV lasix and will require additional [name] to return baseline volume status.  Continues with orthopnea requiring 2 pillows to sleep.   - IV lasix 80 mg bid today   - Goal net negative -2L daily  - Daily BMP  - Daily weight checks (standing)  - Strict I/Os  - Restart home torsemide 40 mg daily + additional 40 mg if wt gain/fluid retention  - Tafamidis (home med) 61 mg daily     COPD (on 3L at baseline)  Patient is satting well on home 3L. Exam not consistent with COPD exacerbation. Will continue home therapies.   Barbara Huber for home Spiriva  - Breo Huber for home Advair  - Continue home singulair  ??  Recent Admission for Complicated UTI:  Patient was recently admitted for urosepsis and complicated UTI. She was discharged on a 7 day course of cefpodoxime. She currently denies any recurrent urinary symptoms.  - Continue Cefedinir 300 mg q12 (- 5/1)    Chronic Problems:  T2DM (not on insulin): Last A1c 5.5.??No SSI, will monitor daily BMP  Chronic Pain: Home oxycontin 10 mg q12  OSA: Home CPAP  Afib:??Cardioverted 01/09/2021. Continue??home eliquis 5 mg BID, amiodarone 200 mg daily  Peripheral Neuropathy: Continue home gabapentin 300 TID  Iron Deficiency Anemia:??Patient with known iron deficiency anemia with hemoglobin 8.5 patient received??5 doses of 125 mg??IV iron infusions during previous admission. Was not discharged with PO supplementation 2/2 previous puritis reaction. Advised to discuss with PCP.??Daily CBC to trend    Daily Checklist:  Diet: Heart Healthy (3g Na)  DVT PPx: Patient Already on Full Anticoagulation with Eliquis 5mg   Electrolytes: No Repletion Needed  Code Status: Full Code  Dispo: Home    Team Contact Information:   Primary Team: Geriatrics (MEDA)  Primary Resident: Vernell Barrier, MD  Resident's Pager: [phone] (Geriatrics Intern - Blue)    Interval History:   No acute events overnight. Patient reports she is feeling improved overall and able to breathe better, but is still not at baseline. Continues with orthopnea requiring 2 pillows to sleep.  She has noticed the swelling in her legs and abdomen have decreased, but still feels like there is significant amount of particularly abdominal edema that has not resolved.     All other systems were reviewed and are negative except as noted in the HPI    Objective:   Temp:  [36.6 ??C (97.8 ??F)-36.8 ??C (98.2 ??F)] 36.6 ??C (97.8 ??F)  Heart Rate:  [65-72] 71  SpO2 Pulse:  [64-69] 64  Resp:  [18-20] 18  BP: (117-153)/(58-72) 130/62  SpO2:  [93 %-99 %] 93 %    Gen: in NAD, answers questions appropriately  Eyes: sclera anicteric, EOMI  HENT: atraumatic, MMM, OP w/o erythema or exudate   Heart: RRR, S1, S2, no M/R/G, no chest wall tenderness  Lungs: CTAB, normal wob on home 3L O2  Abdomen: +distended, improved from previous but still significant   Extremities: no clubbing, cyanosis. 2+ BLE pitting edema above ankle  Psych: Alert, oriented, appropriate mood and affect    Labs/Studies: Labs and Studies from the last 24hrs per EMR and Reviewed    Barbara Huber, MS4    I have verified all student documentation or findings.  I have personally performed or re-performed the physical exam and medical decision making:  Vernell Barrier, MD PhD   PGY-1 Internal Medicine

## 2021-04-24 NOTE — Unmapped (Signed)
Problem: Skin Injury Risk Increased  Goal: Skin Health and Integrity  Outcome: Progressing  Intervention: Optimize Skin Protection  Recent Flowsheet Documentation  Taken 04/24/2021 1225 by Freida Busman, RN  Pressure Reduction Techniques: frequent weight shift encouraged  Taken 04/24/2021 1008 by Freida Busman, RN  Pressure Reduction Techniques: frequent weight shift encouraged  Taken 04/24/2021 0800 by Freida Busman, RN  Pressure Reduction Techniques: frequent weight shift encouraged  Taken 04/24/2021 0725 by Freida Busman, RN  Pressure Reduction Techniques: frequent weight shift encouraged     Problem: Adult Inpatient Plan of Care  Goal: Plan of Care Review  Outcome: Progressing  Goal: Patient-Specific Goal (Individualized)  Outcome: Progressing  Goal: Absence of Hospital-Acquired Illness or Injury  Outcome: Progressing  Intervention: Identify and Manage Fall Risk  Recent Flowsheet Documentation  Taken 04/24/2021 1225 by Freida Busman, RN  Safety Interventions:   fall reduction program maintained   low bed  Taken 04/24/2021 1008 by Freida Busman, RN  Safety Interventions:   fall reduction program maintained   low bed  Taken 04/24/2021 0800 by Freida Busman, RN  Safety Interventions:   fall reduction program maintained   low bed  Intervention: Prevent and Manage VTE (Venous Thromboembolism) Risk  Recent Flowsheet Documentation  Taken 04/24/2021 1225 by Freida Busman, RN  Activity Management:   activity adjusted per tolerance   activity encouraged  Taken 04/24/2021 1008 by Freida Busman, RN  Activity Management:   activity adjusted per tolerance   activity encouraged  Taken 04/24/2021 0800 by Freida Busman, RN  Activity Management: ambulated to bathroom  Goal: Optimal Comfort and Wellbeing  Outcome: Progressing  Goal: Readiness for Transition of Care  Outcome: Progressing  Goal: Rounds/Family Conference  Outcome: Progressing     Problem: Self-Care Deficit  Goal: Improved Ability to Complete Activities of Daily Living  Outcome: Progressing     Problem: Fluid Imbalance (Heart Failure)  Goal: Fluid Balance  Outcome: Progressing     Problem: Respiratory Compromise (Heart Failure)  Goal: Effective Oxygenation and Ventilation  Outcome: Progressing     Problem: Fall Injury Risk  Goal: Absence of Fall and Fall-Related Injury  Outcome: Progressing  Intervention: Promote Injury-Free Environment  Recent Flowsheet Documentation  Taken 04/24/2021 1225 by Freida Busman, RN  Safety Interventions:   fall reduction program maintained   low bed  Taken 04/24/2021 1008 by Freida Busman, RN  Safety Interventions:   fall reduction program maintained   low bed  Taken 04/24/2021 0800 by Freida Busman, RN  Safety Interventions:   fall reduction program maintained   low bed       Patient A/O x 4. Patient reports no acute/chronic pain.  Pt reports no shortness of breath and has reamined on her baseline of 3 liters of oxygen today.  ECHo completed today.  IV lasix given as ordered. VSS and patient has remained afebrile. UOP sufficient. Patient has no new concerns at this time.

## 2021-04-24 NOTE — Unmapped (Signed)
Problem: Skin Injury Risk Increased  Goal: Skin Health and Integrity  Outcome: Ongoing - Unchanged  Intervention: Optimize Skin Protection  Recent Flowsheet Documentation  Taken 04/23/2021 1530 by Diannia Ruder, RN  Pressure Reduction Techniques: frequent weight shift encouraged     Problem: Adult Inpatient Plan of Care  Goal: Plan of Care Review  Outcome: Ongoing - Unchanged  Goal: Patient-Specific Goal (Individualized)  Outcome: Ongoing - Unchanged  Goal: Absence of Hospital-Acquired Illness or Injury  Outcome: Ongoing - Unchanged  Goal: Optimal Comfort and Wellbeing  Outcome: Ongoing - Unchanged  Goal: Readiness for Transition of Care  Outcome: Ongoing - Unchanged  Goal: Rounds/Family Conference  Outcome: Ongoing - Unchanged     Problem: Self-Care Deficit  Goal: Improved Ability to Complete Activities of Daily Living  Outcome: Ongoing - Unchanged    Pt arrived to unit as transfer from ED.  She is A&O x 4, and VSS.  She is currently on 3L [place], which is baseline. Pt denies SOB at this time. Purwick device in place per pt request.  Safety precautions in place. No further needs at this time.

## 2021-04-25 LAB — BASIC METABOLIC PANEL
ANION GAP: 7 mmol/L (ref 5–14)
BLOOD UREA NITROGEN: 31 mg/dL — ABNORMAL HIGH (ref 9–23)
BUN / CREAT RATIO: 22
CALCIUM: 8.8 mg/dL (ref 8.7–10.4)
CHLORIDE: 99 mmol/L (ref 98–107)
CO2: 33 mmol/L — ABNORMAL HIGH (ref 20.0–31.0)
CREATININE: 1.42 mg/dL — ABNORMAL HIGH
EGFR CKD-EPI AA FEMALE: 41 mL/min/{1.73_m2} — ABNORMAL LOW (ref >=60)
EGFR CKD-EPI NON-AA FEMALE: 35 mL/min/{1.73_m2} — ABNORMAL LOW (ref >=60)
GLUCOSE RANDOM: 148 mg/dL (ref 70–179)
POTASSIUM: 4 mmol/L (ref 3.4–4.8)
SODIUM: 139 mmol/L (ref 135–145)

## 2021-04-25 LAB — CBC
HEMATOCRIT: 29.5 % — ABNORMAL LOW (ref 34.0–44.0)
HEMOGLOBIN: 9.3 g/dL — ABNORMAL LOW (ref 11.3–14.9)
MEAN CORPUSCULAR HEMOGLOBIN CONC: 31.5 g/dL — ABNORMAL LOW (ref 32.0–36.0)
MEAN CORPUSCULAR HEMOGLOBIN: 26.5 pg (ref 25.9–32.4)
MEAN CORPUSCULAR VOLUME: 84.2 fL (ref 77.6–95.7)
MEAN PLATELET VOLUME: 8 fL (ref 6.8–10.7)
PLATELET COUNT: 332 10*9/L (ref 150–450)
RED BLOOD CELL COUNT: 3.5 10*12/L — ABNORMAL LOW (ref 3.95–5.13)
RED CELL DISTRIBUTION WIDTH: 18.4 % — ABNORMAL HIGH (ref 12.2–15.2)
WBC ADJUSTED: 11.8 10*9/L — ABNORMAL HIGH (ref 3.6–11.2)

## 2021-04-25 LAB — MAGNESIUM: MAGNESIUM: 2.2 mg/dL (ref 1.6–2.6)

## 2021-04-25 MED ORDER — CEFPODOXIME 200 MG TABLET
ORAL_TABLET | Freq: Two times a day (BID) | ORAL | 0 refills | 7 days
Start: 2021-04-25 — End: 2021-04-28

## 2021-04-25 MED ADMIN — apixaban (ELIQUIS) tablet 5 mg: 5 mg | ORAL

## 2021-04-25 MED ADMIN — apixaban (ELIQUIS) tablet 5 mg: 5 mg | ORAL | @ 13:00:00 | Stop: 2021-04-25

## 2021-04-25 MED ADMIN — oxyCODONE (OxyCONTIN) 12 hr crush resistant ER/CR tablet 10 mg: 10 mg | ORAL | @ 13:00:00 | Stop: 2021-04-25

## 2021-04-25 MED ADMIN — cefdinir (OMNICEF) capsule 300 mg: 300 mg | ORAL | @ 13:00:00 | Stop: 2021-04-25

## 2021-04-25 MED ADMIN — amiodarone (PACERONE) tablet 200 mg: 200 mg | ORAL | @ 13:00:00 | Stop: 2021-04-25

## 2021-04-25 MED ADMIN — **Patient Own Med** tafamidis cap 61 mg: 61 mg | ORAL | @ 13:00:00 | Stop: 2021-04-25

## 2021-04-25 MED ADMIN — cefdinir (OMNICEF) capsule 300 mg: 300 mg | ORAL | Stop: 2021-04-27

## 2021-04-25 MED ADMIN — gabapentin (NEURONTIN) capsule 300 mg: 300 mg | ORAL

## 2021-04-25 MED ADMIN — montelukast (SINGULAIR) tablet 10 mg: 10.0 mg | ORAL | @ 13:00:00 | Stop: 2021-04-25

## 2021-04-25 MED ADMIN — oxyCODONE (OxyCONTIN) 12 hr crush resistant ER/CR tablet 10 mg: 10 mg | ORAL | Stop: 2021-05-07

## 2021-04-25 MED ADMIN — spironolactone (ALDACTONE) tablet 50 mg: 50 mg | ORAL | @ 13:00:00 | Stop: 2021-04-25

## 2021-04-25 MED ADMIN — torsemide (DEMADEX) tablet 40 mg: 40 mg | ORAL | @ 13:00:00 | Stop: 2021-04-25

## 2021-04-25 MED ADMIN — gabapentin (NEURONTIN) capsule 300 mg: 300 mg | ORAL | @ 18:00:00 | Stop: 2021-04-25

## 2021-04-25 MED ADMIN — fluticasone furoate-vilanteroL (BREO ELLIPTA) 100-25 mcg/dose inhaler 1 puff: 1 | RESPIRATORY_TRACT | @ 12:00:00 | Stop: 2021-04-25

## 2021-04-25 MED ADMIN — umeclidinium (INCRUSE ELLIPTA) 62.5 mcg/actuation inhaler 1 puff: 1 | RESPIRATORY_TRACT | @ 12:00:00 | Stop: 2021-04-25

## 2021-04-25 MED ADMIN — gabapentin (NEURONTIN) capsule 300 mg: 300 mg | ORAL | @ 13:00:00 | Stop: 2021-04-25

## 2021-04-25 NOTE — Unmapped (Signed)
Clinical Pharmacy Non-Formulary Home Medication Evaluation Note     Team is requesting continuation of the patient's non-formulary home medication tafamidis. Per PolicyStat ID: [id number] (Medication Management: Patient-Supplied Medications,) the continued use of these medications during hospital admission was evaluated by pharmacy.      Recommendation: Continuation of non-formulary home medication tafamidis is clinically necessary and will be identified by pharmacy for continuation. Per policy, patient-supplied medications must be supplied by the patient for the duration of the encounter.     Rationale: This medication was approved for inpatient use by [facility] [place] (pharmacy administration). This approval is good for 6 months and expires on 10/24/2021     This information has been communicated to the ordering clinician. Pharmacy will continue to follow patient with team.  Please page the service pharmacist pager found in the [facility] Directory if further immediate assistance is required.    Thank you,     Truddie Coco, Pharm D, BCPS, BCGP

## 2021-04-25 NOTE — Unmapped (Signed)
Problem: Adult Inpatient Plan of Care  Goal: Plan of Care Review  Outcome: Progressing  Goal: Patient-Specific Goal (Individualized)  Outcome: Progressing  Goal: Absence of Hospital-Acquired Illness or Injury  Outcome: Progressing  Intervention: Identify and Manage Fall Risk  Recent Flowsheet Documentation  Taken 04/24/2021 2000 by Myles Lipps, RN  Safety Interventions: fall reduction program maintained  Goal: Optimal Comfort and Wellbeing  Outcome: Progressing  Goal: Readiness for Transition of Care  Outcome: Progressing  Goal: Rounds/Family Conference  Outcome: Progressing   A/O. VSS. NAD. No c/o. SOB. Purewick in place. Home CPAp ordered and applied by RT. No c/o pain.

## 2021-04-25 NOTE — Unmapped (Signed)
Problem: Skin Injury Risk Increased  Goal: Skin Health and Integrity  Outcome: Resolved  Intervention: Optimize Skin Protection  Recent Flowsheet Documentation  Taken 04/25/2021 1206 by Freida Busman, RN  Pressure Reduction Techniques: frequent weight shift encouraged  Taken 04/25/2021 1020 by Freida Busman, RN  Pressure Reduction Techniques: frequent weight shift encouraged  Taken 04/25/2021 0347 by Freida Busman, RN  Pressure Reduction Techniques: frequent weight shift encouraged     Problem: Adult Inpatient Plan of Care  Goal: Plan of Care Review  Outcome: Resolved  Goal: Patient-Specific Goal (Individualized)  Outcome: Resolved  Goal: Absence of Hospital-Acquired Illness or Injury  Outcome: Resolved  Intervention: Identify and Manage Fall Risk  Recent Flowsheet Documentation  Taken 04/25/2021 1206 by Freida Busman, RN  Safety Interventions:   fall reduction program maintained   low bed  Taken 04/25/2021 1020 by Freida Busman, RN  Safety Interventions:   fall reduction program maintained   low bed  Taken 04/25/2021 4259 by Freida Busman, RN  Safety Interventions:   fall reduction program maintained   low bed  Intervention: Prevent and Manage VTE (Venous Thromboembolism) Risk  Recent Flowsheet Documentation  Taken 04/25/2021 1020 by Freida Busman, RN  Activity Management: up in chair  Taken 04/25/2021 5638 by Freida Busman, RN  Activity Management:   activity adjusted per tolerance   activity encouraged  Goal: Optimal Comfort and Wellbeing  Outcome: Resolved  Goal: Readiness for Transition of Care  Outcome: Resolved  Goal: Rounds/Family Conference  Outcome: Resolved     Problem: Self-Care Deficit  Goal: Improved Ability to Complete Activities of Daily Living  Outcome: Resolved     Problem: Fluid Imbalance (Heart Failure)  Goal: Fluid Balance  Outcome: Resolved     Problem: Respiratory Compromise (Heart Failure)  Goal: Effective Oxygenation and Ventilation  Outcome: Resolved     Problem: Fall Injury Risk  Goal: Absence of Fall and Fall-Related Injury  Outcome: Resolved  Intervention: Promote Injury-Free Environment  Recent Flowsheet Documentation  Taken 04/25/2021 1206 by Freida Busman, RN  Safety Interventions:   fall reduction program maintained   low bed  Taken 04/25/2021 1020 by Freida Busman, RN  Safety Interventions:   fall reduction program maintained   low bed  Taken 04/25/2021 7564 by Freida Busman, RN  Safety Interventions:   fall reduction program maintained   low bed    Discharge orders present and patient verbalizes readiness for discharge.  AVS reviewed and patient verbalized understanding with no additional questions.  Pt escorted out of hospital via wheelchair.

## 2021-04-25 NOTE — Unmapped (Signed)
Home health has been set up for through the agency listed below. The Home health agency will be contacting you to set up a time for them to come see you in your home within 2 days of your discharge.  If you have not heard from them prior to 04/27/21 or you have any questions about home health, please contact them at the phone number listed below.    [facility] - Admitted Since 04/23/2021     Service Provider Selected Services Address Phone Fax Patient Preferred    [facility] [address] [place], [place] [address] [phone] ???

## 2021-04-25 NOTE — Unmapped (Signed)
[facility] Geriatrics Discharge Summary     Identifying Information:   Barbara Huber  [birth year]  [id number]    PCP: Jacquiline Doe, MD    Admit date: 04/23/2021     Discharge date: 04/25/2021     Discharge Attending Physician: Milana Kidney, MD; (P): [phone]    Discharge to: Home with Home Health: [facility]: [phone]    Discharge Diagnoses:  Principal Problem:    Dyspnea on exertion  Active Problems:    COPD (chronic obstructive pulmonary disease) (CMS-HCC)    Sleep apnea in adult    Essential hypertension    Type 2 diabetes mellitus with stage 3b chronic kidney disease, without long-term current use of insulin (CMS-HCC)    Anemia    Persistent atrial fibrillation (CMS-HCC)    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)    Cardiac amyloidosis (CMS-HCC)  Resolved Problems:    * No resolved hospital problems. [facility] Course     PCP Post-Discharge Follow Up Issues:   [ ]  PCP - discharged on torsemide 40 mg daily with additional 40 mg each day if weight gain > 3 lbs in 1-2 days or subjective abdominal fullness. Consider further titration as needed  [ ]  Complete last 3 days cefpodoxime for UTI (-5/2)  [ ]  PCP - Gabapentin recently titrated 600 mg >  300mg  TID due to c/f contirubtion to weakness/confusion. May tolerate further titration  [ ]  PCP - continue to reassess pain regimen (oxy) given sedating medications in geriatric patient   [ ]  Recently s/p IV iron at previous admission, f/u hgb and iron levels   To contact the discharging attending physician with regard to the patient's hospitalization or follow-up needs, please call: [facility] Division of Geriatrics [phone]).     Barbara Huber??is a 80 y.o.??female??with a PMHx of TTR cardiac amyloid, COPD on 3L O2 at home, OSA, pHTN, T2DM, [name]3??who presented to [facility] with shortness of breath,??DOE, orthopnea and??abdominal??edema??for acute heart failure exacerbation.  ??  Hospital Course as detailed by problem below:  ??  Acute Heart Failure Exacerbation - [name] 2/2 TTR Cardiac Amyloid:  Patient presented??with dyspnea on exertion, orthopnea??and PND following??recent admission for urosepsis. Baseline weight between 210-215 pounds, weighed 228 on admission.??Heart failure exacerbation likely multifactorial in nature including infection, IVF from abx / iron and inadequate diuretics after previous admission. Repeat echo without any worsening function (LVEF > 55%). Received [name] with Lasix and good result. Resolution of symptoms and EDW 211 lbs at discharged. Resuming home diuresis regimen of torsemide 40 mg daily with additional 40 mg if weight gain > 3 pounds over 1-2 days or worsening clinical symptoms.   ??  COPD??(on 3L at baseline)  Patient is satting well on home 3L.??Exam not consistent with COPD exacerbation. She was maintained on her home therapies.??  ??  Recent Admission for Complicated UTI:  Patient was recently admitted for urosepsis and complicated UTI. She denied any recurrent urinary symptoms and was continued on Cefedinir 300 mg q12??(- 5/1). Discharged with resumption of previous cefpoxidime through 5/2.     GERIATRIC ASSESSMENT: None completed during this hospitalization   *Refer below for the patient's functional / cognitive assessments, and advance care planning*      Functional Assessment       ADLs:  IADLs:   Feeding: Independent  Dressing: Independent  Ambulation: Requires Assistance  Toileting: Independent  Bathing: Independent   Using the phone: Independent  Shopping: Dependent  Meal preparation: Requires Assistance  Medication mgmt: Independent  Managing finances: Dependent  Housework: Requires Presenter, broadcasting (driving or navigating public transit): Dependent     Living situation: Patient lives in family or friend's home with their children.     Changes in ADLs during hospitalization: None    Assistive devices: Walker    Additional services recommended at discharge: Home Health already ordered     Cognitive Assessment     Delirium Assessment: CAM (Confusion Assessment Method):    On discharge, the patient did not show evidence of delirium. .      Other cognitive assessment:  The patient did not undergo formal screening for cognitive impairment.    Advance Care Planning     The patient has NOT completed a living will, HCPOA or MOST form.     Code Status: Full Code    Surrogate decision maker: Barbara Huber, daughter    Emergency Contact:  Extended Emergency Contact Information  Primary Emergency Contact: Barbara,Huber  Address: [address]           [place], [place] [address] [place]  Home Phone: [phone]  Work Phone: [phone]  Mobile Phone: [phone]  Relation: Daughter    Procedures   None  No admission procedures for hospital encounter.    Discharge Day Services:   BP 131/63  - Pulse 76  - Temp 36.3 ??C (Temporal)  - Resp 18  - Ht 162.6 cm (5' 4)  - Wt 96 kg (211 lb 11.2 oz)  - SpO2 93%  - BMI 36.34 kg/m??    Last 5 Recorded Weights    04/23/21 0933 04/24/21 0517 04/24/21 0826 04/25/21 0500   Weight: (!) 103.4 kg (228 lb) 97.2 kg (214 lb 4.6 oz) 97.4 kg (214 lb 11.2 oz) 96 kg (211 lb 11.2 oz)       Pt seen on the day of discharge and determined appropriate for discharge.    Condition at Discharge: fair    Length of Discharge: I spent greater than 30 mins in the discharge of this patient.    Discharge Medication List:        Your Medication List      CHANGE how you take these medications    cefpodoxime 200 MG tablet  Commonly known as: VANTIN  Take 1 tablet (200 mg total) by mouth Two (2) times a day for 3 days. Take 1 tablet (200 mg total) by mouth Two (2) times a day until 5/2  What changed: additional instructions        CONTINUE taking these medications    ACCU-CHEK AVIVA PLUS TEST STRP Strp  Generic drug: blood sugar diagnostic  USE TO CHECK BLOOD SUGAR 3 TIMES A DAY BEFORE MEALS     acetaminophen 500 MG tablet  Commonly known as: TYLENOL  Take 1,000 mg by mouth daily as needed for pain.     ADVAIR HFA 115-21 mcg/actuation inhaler  Generic drug: fluticasone propion-salmeteroL  Inhale 2 puffs Two (2) times a day.     albuterol 90 mcg/actuation inhaler  Commonly known as: PROVENTIL HFA;VENTOLIN HFA  Inhale 2 puffs every eight (8) hours as needed for wheezing.     amiodarone 200 MG tablet  Commonly known as: PACERONE  Take 1 tablet (200 mg total) by mouth daily.     apixaban 5 mg Tab  Commonly known as: ELIQUIS  Take 1 tablet (5 mg total) by mouth Two (2) times a  day.     estradioL 0.01 % (0.1 mg/gram) vaginal cream  Commonly known as: ESTRACE  INSERT ONE GRAM VAGINALLY AT BEDTIME     gabapentin 300 MG capsule  Commonly known as: NEURONTIN  Take 1 capsule (300 mg total) by mouth Three (3) times a day.     lancets Misc  1 each by Miscellaneous route daily. Accu Check     metFORMIN 500 MG tablet  Commonly known as: GLUCOPHAGE  Take 1 tablet (500 mg total) by mouth 2 (two) times a day with meals.     montelukast 10 mg tablet  Commonly known as: SINGULAIR  Take 1 tablet (10 mg total) by mouth daily.     NARCAN 4 mg/actuation nasal spray  Generic drug: naloxone  1 spray into alternating nostrils once as needed (opioid overdose).     oxyCODONE 10 mg Tr12 12 hr crush resistant ER/CR tablet  Commonly known as: OxyCONTIN  Take 10 mg by mouth every twelve (12) hours.     OXYGEN-AIR DELIVERY SYSTEMS MISC  5 L by Miscellaneous route.     rosuvastatin 5 MG tablet  Commonly known as: CRESTOR  Take 1 tablet (5 mg total) by mouth every other day.     SHINGRIX (PF) 50 mcg/0.5 mL Susr injection  Generic drug: varicella-zoster gE-AS01B (PF)  Inject 0.5 mL into the muscle.     spironolactone 50 MG tablet  Commonly known as: ALDACTONE  Take 1 tablet (50 mg total) by mouth daily.     tiotropium bromide 2.5 mcg/actuation inhalation mist  Commonly known as: SPIRIVA RESPIMAT  Inhale 2 puffs daily.     torsemide 20 MG tablet  Commonly known as: DEMADEX  Take 2 tablets (40 mg total) by mouth daily. May also take 2 tablets (40 mg total) daily as needed (weight gain).     VYNDAMAX 61 mg Cap  Generic drug: tafamidis  Take 1 capsule (61 mg) by mouth daily.        ASK your doctor about these medications    valACYclovir 500 MG tablet  Commonly known as: VALTREX  Take 1 tablet (500 mg total) by mouth Two (2) times a day for 1 day.  Ask about: Should I take this medication?               Pending Test Results (if blank, then none):      Most Recent Labs:  Microbiology Results (last day)     ** No results found for the last 24 hours. **          Lab Results   Component Value Date    WBC 11.8 (H) 04/25/2021    HGB 9.3 (L) 04/25/2021    HCT 29.5 (L) 04/25/2021    PLT 332 04/25/2021       Lab Results   Component Value Date    NA 139 04/25/2021    K 4.0 04/25/2021    CL 99 04/25/2021    CO2 33.0 (H) 04/25/2021    BUN 31 (H) 04/25/2021    CREATININE 1.42 (H) 04/25/2021    CALCIUM 8.8 04/25/2021    MG 2.2 04/25/2021    PHOS 3.7 01/07/2021       Lab Results   Component Value Date    ALKPHOS 75 04/23/2021    BILITOT 0.4 04/23/2021    BILIDIR 0.60 (H) 10/11/2020    PROT 7.5 04/23/2021    ALBUMIN 3.3 (L) 04/23/2021    ALT 11 04/23/2021  AST 14 04/23/2021    GGT 215 (H) 09/29/2020       Lab Results   Component Value Date    PT 26.4 (H) 09/27/2020    INR 2.27 09/27/2020    APTT 46.9 (H) 01/10/2021     Hospital Radiology:  ECG 12 Lead    Result Date: 04/24/2021  NORMAL SINUS RHYTHM LOW VOLTAGE QRS CANNOT RULE OUT ANTERIOR INFARCT  (CITED ON OR BEFORE 23-Apr-2021) ABNORMAL ECG WHEN COMPARED WITH ECG OF 18-Apr-2021 11:35, T WAVE INVERSION NOW EVIDENT IN LATERAL LEADS Confirmed by Eldred Manges ([id number]) on 04/24/2021 7:11:45 AM    XR Chest 1 view Portable    Result Date: 04/23/2021  EXAM: XR CHEST PORTABLE DATE: 04/23/2021 ACCESSION: [id number] UN DICTATED: 04/23/2021 10:27 AM INTERPRETATION LOCATION: [facility] CLINICAL INDICATION: 80 years old Female with SHORTNESS OF BREATH  COMPARISON: 04/18/2021 TECHNIQUE: Portable Chest Radiograph. FINDINGS: Interval decreased lung volumes. Increased diffuse bilateral reticular interstitial opacities. No airspace consolidation. No pleural effusion or pneumothorax. Stable cardiomediastinal silhouette.     Increased interstitial edema versus inflammation or atypical infection.    Echocardiogram W Colorflow Spectral Doppler    Result Date: 04/24/2021  Patient Info Name:     CYLA HALUSKA Age:     38 years DOB:     03-30-1941 Gender:     Female MRN:     [id number] Accession #:     [id number] Ht:     163 cm Wt:     97 kg BSA:     2.14 m2 HR:     72 bpm BP:     130 /     62 mmHg Technical Quality:     Fair Exam Date:     04/24/2021 11:22 AM Site Location:     [facility] Exam Location:     [facility] Admit Date:     04/23/2021 Exam Type:     ECHOCARDIOGRAM W COLORFLOW SPECTRAL DOPPLER Study Info Indications      - Hx amyloidosis,  HF exacerbation Complete two-dimensional, color flow and Doppler transthoracic echocardiogram is performed. Staff Referring Physician:     Milana Kidney ; Reading Fellow:     [name] MD Sonographer:     Candis Schatz Ordering Physician:     Ma Hillock Account #:     [id number] Summary   1. The left ventricle is normal in size with mildly increased wall thickness.   2. The left ventricular systolic function is normal, LVEF is visually estimated at > 55%.   3. There is grade II diastolic dysfunction (elevated filling pressure).   4. Mitral annular calcification is present (mild).   5. The left atrium is moderately dilated in size.   6. The right ventricle is mildly dilated in size, with low normal systolic function.   7. IVC size and inspiratory change suggest mildly elevated right atrial pressure. (5-10 mmHg). Left Ventricle   The left ventricle is normal in size with mildly increased wall thickness.   The left ventricular systolic function is normal, LVEF is visually estimated at > 55%.   There is grade II diastolic dysfunction (elevated filling pressure). Right Ventricle   The right ventricle is mildly dilated in size, with low normal systolic function. Left Atrium   The left atrium is moderately dilated in size. Right Atrium   The right atrium is normal  in size. Aortic Valve   The aortic valve is trileaflet with normal appearing leaflets with normal excursion.   There is no  significant aortic regurgitation.   There is no evidence of a significant transvalvular gradient. Pulmonic Valve   The pulmonic valve is normal.   There is trivial pulmonic regurgitation.   There is no evidence of a significant transvalvular gradient. Mitral Valve   The mitral valve leaflets are normal with normal leaflet mobility.   Mitral annular calcification is present (mild).   There is no significant mitral valve regurgitation. Tricuspid Valve   The tricuspid valve leaflets are normal, with normal leaflet mobility.   There is trivial tricuspid regurgitation. Pulmonary Arteries   The pulmonary artery is not well visualized. Pericardium/Pleural   There is no pericardial effusion. Inferior Vena Cava   IVC size and inspiratory change suggest mildly elevated right atrial pressure. (5-10 mmHg). Aorta   The aorta is normal in size in the visualized segments. Left Ventricular Outflow Tract ---------------------------------------------------------------------- Name                                 Value        Normal ---------------------------------------------------------------------- LVOT Doppler ---------------------------------------------------------------------- LVOT Peak Velocity                 1.2 m/s Pulmonic Valve ---------------------------------------------------------------------- Name                                 Value        Normal ---------------------------------------------------------------------- PV Doppler ---------------------------------------------------------------------- PV Peak Velocity                   1.2 m/s Mitral Valve ---------------------------------------------------------------------- Name                                 Value        Normal ---------------------------------------------------------------------- MV Diastolic Function ---------------------------------------------------------------------- MV E Peak Velocity                102 cm/s               MV A Peak Velocity                 58 cm/s               MV E/A                                 1.8               MV Annular TDI ---------------------------------------------------------------------- MV Septal e' Velocity             4.7 cm/s         >=8.0 MV Lateral e' Velocity            7.1 cm/s        >=10.0 MV e' Average                          5.9               MV E/e' (Average)                     18.1 Tricuspid Valve ---------------------------------------------------------------------- Name  Value        Normal ---------------------------------------------------------------------- Estimated PAP/RSVP ---------------------------------------------------------------------- RA Pressure                         8 mmHg           <=5 Aorta ---------------------------------------------------------------------- Name                                 Value        Normal ---------------------------------------------------------------------- Ascending Aorta ---------------------------------------------------------------------- Ao Root Diameter (2D)               3.2 cm               Ao Root Diam Index (2D)         15.0 cm/m2               Asc Ao Diameter                     3.2 cm Aortic Valve ---------------------------------------------------------------------- Name                                 Value        Normal ---------------------------------------------------------------------- AV Doppler ---------------------------------------------------------------------- AV Peak Velocity                   1.3 m/s               AV V1/V2 Ratio 0.95 Ventricles ---------------------------------------------------------------------- Name                                 Value        Normal ---------------------------------------------------------------------- LV Dimensions 2D/MM ---------------------------------------------------------------------- IVS Diastolic Thickness (2D)        1.3 cm       0.6-0.9 LVID Diastole (2D)                  4.1 cm       3.8-5.2 LVPW Diastolic Thickness (2D)                                1.2 cm       0.6-0.9 LVID Systole (2D)                   2.6 cm       2.2-3.5 RV Dimensions 2D/MM ---------------------------------------------------------------------- RV Basal Diastolic Dimension        4.3 cm       2.5-4.1 TAPSE                               1.8 cm         >=1.7 Atria ---------------------------------------------------------------------- Name                                 Value        Normal ---------------------------------------------------------------------- LA Dimensions ---------------------------------------------------------------------- LA Dimension (2D)                   4.6 cm       2.7-3.8 LA Volume (BP MOD)  89 ml               LA Volume Index (BP MOD)       41.57 ml/m2   16.00-34.00 RA Dimensions ---------------------------------------------------------------------- RA Area (4C)                      18.0 cm2        <=18.0 RA Area (4C) Index              8.4 cm2/m2 Report Signatures Resident [name]  MD on 04/24/2021 02:09 PM      Discharge Instructions:       Why was I admitted?   You have been admitted with shortness of breath and abdominal /lower extremity swelling. This is caused by your heart not pumping well and too much fluid after your recent hospitalization. We used IV lasix to get the extra fluid off and you were back to your normal weight before discharge.    MEDICATION CHANGES  There were NO medication changes during your admission except:   - Please continue to take your Cefpodoxime 200 mg twice daily only through 5/2. You will likely have left over pills because you received antibiotics at the hospital in place of several days of these.   - Make sure to take your torsemide 40 mg daily to keep off extra fluid    What should I watch for in the next few weeks?  If have increased fluid retention / swelling in abdomen or feet, worsening breathing or gain more than 3 pounds in 1-2 days, please take an extra dose of torsemide (40 mg). If continues despite taking torsemide twice a day, please contact your primary care physician.       Who will I follow up with?  We have scheduled you follow up with these doctors  Future Appointments   Date Time Provider Department Center   05/06/2021  3:20 PM [facility] HOSPITAL FOLLOW-UP Mayme Genta [facility]   05/23/2021 12:15 PM PFT 3 [facility]   05/23/2021 12:30 PM Zannie Cove, MD [facility]   06/23/2021 10:30 AM Nilda Simmer Lorbacher, AGNP [facility]   07/10/2021  4:10 PM Artelia Laroche, MD [facility]   08/29/2021 10:00 AM Carin Hock, MD [facility]        When you see your primary doctor, please get these labs drawn:  - BMP     It is important to discuss healthcare issues with your primary care doctor at an upcoming visit, so that your doctor knows what is important to you and what you would or would not want done if you were to become very ill    What should I do if I start to feel sick again?  If the shortness of breath and swelling worsens, we would recommend that you give yourself extra dose of torsemide. If continues despite taking torsemide 40 mg twice daily, please contact your primary care physician    In general, if you have specific questions about these instructions or your hospital stay in the next 48 hours, please call [phone] and ask for the geriatrics doctor on call. Let them know that you were just discharged from the hospital.     For new symptoms or problems, call your primary care provider.     If you believe that this is a life-threatening emergency, call 911 or go to the emergency room.  Thank you for allowing [place] to participate in your care.             Other Instructions     Discharge instructions      Why was I admitted?   You have been admitted with shortness of breath and abdominal /lower extremity swelling. This is caused by your heart not pumping well and too much fluid after your recent hospitalization. We used IV lasix to get the extra fluid off and you were back to your normal weight before discharge.    MEDICATION CHANGES  There were NO medication changes during your admission except:   - Please continue to take your Cefpodoxime 200 mg twice daily only through 5/2. You will likely have left over pills because you received antibiotics at the hospital in place of several days of these.   - Make sure to take your torsemide 40 mg daily to keep off extra fluid    What should I watch for in the next few weeks?  If have increased fluid retention / swelling in abdomen or feet, worsening breathing or gain more than 3 pounds in 1-2 days, please take an extra dose of torsemide (40 mg). If continues despite taking torsemide twice a day, please contact your primary care physician.       Who will I follow up with?  We have scheduled you follow up with these doctors  Future Appointments  Date Time Provider Department Center  04/29/2021  1:40 PM [facility] HOSPITAL FOLLOW-UP [facility]  05/23/2021 12:15 PM PFT 3 [facility]  05/23/2021 12:30 PM Zannie Cove, MD [facility]  06/23/2021 10:30 AM Nilda Simmer Lorbacher, AGNP [place] [facility]  07/10/2021  4:10 PM Artelia Laroche, MD [facility]  08/29/2021 10:00 AM Carin Hock, MD [facility]       When you see your primary doctor, please get these labs drawn:  - BMP     It is important to discuss healthcare issues with your primary care doctor at an upcoming visit, so that your doctor knows what is important to you and what you would or would not want done if you were to become very ill    What should I do if I start to feel sick again?  If the shortness of breath and swelling worsens, we would recommend that you give yourself extra dose of torsemide. If continues despite taking torsemide 40 mg twice daily, please contact your primary care physician    In general, if you have specific questions about these instructions or your hospital stay in the next 48 hours, please call [phone] and ask for the geriatrics doctor on call. Let them know that you were just discharged from the hospital.     For new symptoms or problems, call your primary care provider.     If you believe that this is a life-threatening emergency, call 911 or go to the emergency room.       Thank you for allowing [place] to participate in your care.          Follow Up instructions and Outpatient Referrals     Ambulatory referral to Home Health      Is this a [facility] or [facility] Patient?: Yes    Home Health Options: Traditional Home Health    Is this patient at high risk for COVID 19 transmission and recommended to   stay at home during this pandemic?: Yes  If the patient has a diagnosis of heart failure and is already on oral   diuretics, do you want to activate the in home IV Lasix protocol?: Yes    Do you want agency provider parameter notifications or patient specific   provider parameter notifications?: Agency    Do you want to initiate remote patient monitoring?: Yes    Physician to follow patient's care: Referring Provider    Disciplines requested:  Nursing  Physical Therapy       Nursing requested: Other: (please enter in comments) Comment - Heart   Failure    Physical Therapy requested: Evaluate and treat    Requested SOC Date:  Comment - within 48 hrs    Do you want ongoing co-management?: No    Care coordination required?: No    Discharge instructions          Appointments which have been scheduled for you    May 06, 2021  3:20 PM  (Arrive by 3:05 PM)  HOSPITAL FOLLOW UP RETURN FM with [facility] HOSPITAL FOLLOW-UP  [facility]NAL MEDIC[facility] [place] [facility]) [address]  [place] [address]  [phone]      May 23, 2021 12:15 PM  (Arrive by 12:00 PM)  RETURN PFT 15 with PFT 3  [facility] PULMONARY SPECIALTY FUNCT [place] [facility] REGION) [address]  [place] [address]  [phone]      May 23, 2021 12:30 PM  (Arrive by 12:15 PM)  RETURN  PULM HYPERTENSION with Zannie Cove, MD  [facility] PULMONARY SPECIALTY CL [place] [facility] REGION) [address]  [place] [address]  [phone]      Jun 23, 2021 10:30 AM  (Arrive by 10:15 AM)  NEW GENERAL with Sylvan Cheese, AGNP  [facility] UROLOGY [facility] REGION) [address]  [place] [address]  [phone]      Jul 10, 2021  4:10 PM  (Arrive by 3:55 PM)  RETURN VIDEO - OTHER with Artelia Laroche, MD  [facility] INTERNAL MEDICINE [place] [facility] REGION) [address]  [place] [address]  [phone]      Aug 29, 2021 10:00 AM  (Arrive by 9:45 AM)  RETURN HEART FAILURE [place] with Carin Hock, MD  [facility] CARDIOLOGY [place] [facility] REGION) [address]  [place] [address]  [phone]             Method of DC Summary routing: Route function in epic

## 2021-04-25 NOTE — Unmapped (Signed)
[facility] Refill Coordination Note    Specialty Medication(s) to be Shipped:   General Specialty: Sheilah Mins and Vyndamax 61mg     Other medication(s) to be shipped: No additional medications requested for fill at this time     Barbara Huber, DOB: 1941-06-23  Phone: [phone] (home)       All above HIPAA information was verified with patient's family member, Daughter.     Was a Nurse, learning disability used for this call? No    Completed refill call assessment today to schedule patient's medication shipment from the [facility] [phone]).  All relevant notes have been reviewed.     Specialty medication(s) and dose(s) confirmed: Regimen is correct and unchanged.   Changes to medications: Kelda reports no changes at this time.  Changes to insurance: No  New side effects reported not previously addressed with a pharmacist or physician: None reported  Questions for the pharmacist: No    Confirmed patient received a Conservation officer, historic buildings and a Surveyor, mining with first shipment. The patient will receive a drug information handout for each medication shipped and additional FDA Medication Guides as required.       DISEASE/MEDICATION-SPECIFIC INFORMATION        N/A    SPECIALTY MEDICATION ADHERENCE     Medication Adherence    Patient reported X missed doses in the last month: 0  Specialty Medication: Vyndamax 61mg   Patient is on additional specialty medications: No  Patient is on more than two specialty medications: No              Were doses missed due to medication being on hold? No    Vyndamax 61 mg: 8 days of medicine on hand       REFERRAL TO PHARMACIST     Referral to the pharmacist: Not needed      [facility]     Shipping address confirmed in Epic.     Delivery Scheduled: Yes, Expected medication delivery date: 04/30/21.     Medication will be delivered via Same Day Courier to the prescription address in Epic WAM.    Nancy Nordmann [facility] Specialty Technician

## 2021-04-30 MED FILL — VYNDAMAX 61 MG CAPSULE: ORAL | 30 days supply | Qty: 30 | Fill #3

## 2021-05-02 NOTE — Unmapped (Signed)
HOSPITAL FOLLOW UP PRE-VISIT ASSESSMENT:     Clinical Social Worker (SW) contacted patient via phone to follow up on their recent hospitalization.      I???m calling to ask you some questions and let you know some information related to your recent hospital stay.   Is there someone else in the room? No.   Your appointment will be with a pharmacist and a physician from our clinic who will follow up with your primary care physician.       Confirmed day and time of appointment with patient is:  03/06/21 @ 3:20pm  ?? Do you have difficulty getting to your medical appointments: No  ?? How often do you have someone help you when you read instructions, pamphlets or written material from your doctor or pharmacy: Rarely - states she rarely has daughter review.    Discharge Home Health [facility]) Care and Durable Medical Equipment (DME) Status and Need:   HH:   ?? does  have home health services.  HH Nursing and PT with [facility].  ?? does not have personal care services/personal aide support and denies need for support.    DME:   ?? Patient reports use of nebulizer, oxygen, rollator and scale and pulse oximeter.    ?? Denies need for repair on existing equipment    ?? denies need for additional equipment at this time.         Housing and Aftercare Support:    ?? Patient lives with their daughter and son-in-law in a House in [place].   ?? She denies issues with home safety.    ?? The patient receives support from her daughter. Daughter assists with IADLS - grocery shopping, picking up medications, transportation to appointments, preparing meals Pt notes she also prepares some light meals for herself.Daughter continues to work for [employer] and employer is flexible about when dtr needs to take off to bring patient to appointments.     Food Security:   Within the last 12 months...  ??? you worried that your food would run out before you got the money to buy more Never true  ??? the food you bought just didn't last and you didn't have money to get more Never true     Mental Health: (Completed in Flowsheets)    PHQ-2 Score: 0   Reports no concerns with mood.  Screening complete, no depression identified / no further action needed today     Do you have counseling support? No  Would you like counseling resource information?  No      Substance Use:   ?? Alcohol (complete in SDOH module)  Alcohol Use: Not At Risk   ??? How often do you have a drink containing alcohol?: Never   ??? How many drinks containing alcohol do you have on a typical day when you are drinking?: 1 - 2   ??? How often do you have 5 or more drinks on one occasion?: Never        ?? Other Substances (complete in SDOH module)  ? In the past year how often have you taken non-prescribed prescription medications? Never  ? In the past year how often have you used substances such as marijuana, cocaine, heroin, PCP, ecstacy, etc) Never    ?? Tobacco   ? When was the last time you used a tobacco product such as cigarettes, cigars, cigarillos, smokeless tobacco, vape or e-cigarettes?  Quit smoking cigarettes a few years ago    ------------------------------------------------------------------------------------------------------------------------------------------  Family, social and cultural characteristics were assessed during the visit and plan.     Patient was informed they can call the clinic at [phone] with any additional questions or concerns.

## 2021-05-04 DIAGNOSIS — I5032 Chronic diastolic (congestive) heart failure: Principal | ICD-10-CM

## 2021-05-05 NOTE — Unmapped (Signed)
Internal Medicine Hospital Follow-Up Visit    ASSESSMENT / PLAN:  1. 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)    2. Hospital discharge follow-up    3. History of urinary infection    4. Acute cystitis without hematuria    5. Chronic prescription opiate use    6. Myoclonus    7. Generalized weakness      Chronic heart failure with preserved ejection fraction I Hx cardiac amyloidosis  Patient follows with Dr. Wonda Cheng for further management of cardiac amyloidosis.  Was recently admitted for exacerbation of chronic heart failure with preserved ejection fraction.  She is on home torsemide 40 mg daily.  At time of discharge, she was advised to continue this regimen with additional spot doses if she notes elevated weight at home.  She reports tolerating this regimen appropriately.  Denies requiring additional doses as her weight has remained stable at approximately 215 pounds.  -- Continue home torsemide 40 mg daily, with additional spot diuresis if elevated weight noted    Generalized Fatigue I Chronic Prescription Opiate Use  Since recent discharge, patient reports continued generalized weakness and fatigue.  Her generalized weakness is likely multifactorial in the setting of deconditioning from her multiple recent hospital stays, ongoing OSA on home CPAP, [name], anemia, and polypharmacy in the setting of oxycontin/ Gabapentin use. As a result, believe polypharmacy is likely contributing to current presentation of continued generalized fatigue. Follows with chronic pain clinic, Dr. Welton Flakes, in [place]. Patient was advised to decrease her gabapentin 300 mg 3 times daily to 300 mg nightly and follow-up with her chronic pain physician for further evaluation of her OxyContin regimen.  -- Decrease gabapentin 300 mg 3 times daily to 300 mg nightly  -- Follow-up with your chronic pain physician to further discuss down titration of OxyContin 10 mg twice daily  -- Patient advised to follow-up with her PCP for further evaluation of her ongoing generalized fatigue.    Complicated UTI  Patient has a recent history of a complicated UTI, managed with cefpodoxime.  Patient reports completing antibiotic course.  Denies any dysuria, urinary frequency, urinary hesitancy, or hematuria during visit today.  Patient strongly requested rechecking UA to ensure clearance of infection.  Advised that dirty UA in the absence of symptoms would not automatically indicate a urinary tract infection.  However, as patient continues to request repeat testing, ordered this visit.  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  She reports having intermittent jerking movements of her right upper extremity, both provoked and unprovoked with movement.  Suspect primary etiology to be adverse effect of medication, such as gabapentin or narcotic.  Gabapentin down titrated this visit and patient advised to follow-up with chronic pain physician for further de-escalation of oxycodone.  -- Continue gabapentin 300 mg nightly.  Follow-up with chronic pain clinic for de-escalation of oxycodone.    Medication adjustments:   1. Continue your torsemide 40mg  daily with additional spot diuresis with additional torsemide 40mg    2. Decrease your gabapentin to 300 mg nightly from 300mg  TID    Patient provided with an updated and reconciled medications list.    Follow Up: No follow-ups on file.  Future Appointments   Date Time Provider Department Center   05/06/2021  3:20 PM [facility] HOSPITAL FOLLOW-UP [facility]me Genta [facility]   05/23/2021 12:15 PM PFT 3 [facility] [facility]   05/23/2021 12:30 PM Zannie Cove, MD [facility]   06/23/2021 10:30 AM Nilda Simmer Mellody Life, AGNP [place] [facility]   07/10/2021  4:10 PM Artelia Laroche, MD [facility]   08/29/2021 10:00 AM Carin Hock, MD [facility]     Medication adherence and barriers to the treatment plan have been addressed. Opportunities to optimize healthy behaviors have been discussed. Patient / caregiver voiced understanding.    CHIEF COMPLAINT/HISTORY OF PRESENT ILLNESS:   Hospital Follow-Up for No chief complaint on file.    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    Since her recent discharge, patient reports appropriate volume status.  She states that her weight has largely remained unchanged.  She is has only required additional doses of torsemide twice for increased weight.  Remained stable from a respiratory standpoint on 3 L of O2.  Patient otherwise does report continued generalized weakness and daytime fatigue.  Adherent to CPAP at home. She states that she sleeps well at night and wakes up well rested but continues to feel tired during the day.      MEDICATIONS AND ALLERGIES:   Reviewed and updated in Epic.  Medication and allergy review was completed by the pharmacist and discussed during this visit. Please see their note within this encounter.    SOCIAL/FAMILY HISTORY:  Social History:  Reviewed in Epic.  Biopsychosocial barriers to care and advanced directives were addressed by the social worker and discussed during the visit. Please see their note within this encounter.    REVIEW OF SYSTEMS:    All other systems reviewed are negative except as noted here or in HPI.    PHYSICAL EXAM  Vitals:  There were no vitals filed for this visit.    Exam:  General: Well-appearing female in no apparent acute distress  Lungs:  CTA B.  No wheezing or crackles in anterior or posterior lung fields bilaterally. Remains on 3 L O2 via [place].  Heart:  RRR, no rubs, murmurs, gallops, or bruits  Abd: Nontender, nondistended, normal bowel sounds.  Ext: Trace pitting edema bilaterally    Labs:  Reviewed from discharge.  See Epic labs.     Radiology: Reviewed radiology studies from discharge. See in Epic.   Reviewed: Discharge summary and Labs; See results in Epic  Discussed care with: Pharmacist  Interactive Contact by phone or other method (Encounter type: Patient Outreach):     Patient Outreach History (Since 04/21/2021)     Cardiology Refill Coordination     Date Method of Outreach Associated Actions User Next Outreach    04/25/2021  9:20 AM Telephone  Mariana Arn Celedonio Savage 05/21/2021          Care Management     Date Method of Outreach Associated Actions User Next Outreach    05/02/2021  3:50 PM Telephone  Hans Eden, LCSWA           Transition of Care     Date Method of Outreach Associated Actions User Next Outreach    04/26/2021  9:36 AM Telephone  Ludwig Lean, RN     04/22/2021  8:19 AM Telephone  Fran Lowes, RN                Successful contact made or 2 unsuccessful attempts within 2 business days    SUMMARY OF ASSOCIATED HOSPITALIZATION    Admit date: 04/23/2021   ??  Discharge date: 04/25/2021   ??  Discharge Attending Physician: Milana Kidney, MD; (P): [phone]  ??  Discharge to: Home with Home Health: [facility]: [phone]  ??  Discharge Diagnoses:  Principal Problem:    Dyspnea on exertion  Active Problems:    COPD (chronic obstructive pulmonary disease) (CMS-HCC)    Sleep apnea in adult    Essential hypertension    Type 2 diabetes mellitus with stage 3b chronic kidney disease, without long-term current use of insulin (CMS-HCC)    Anemia    Persistent atrial fibrillation (CMS-HCC)    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)    Cardiac amyloidosis (CMS-HCC)  Resolved Problems:    * No resolved hospital problems. *  ??  ??  Hospital Course   ??  PCP Post-Discharge Follow Up Issues:   [ ]  PCP - discharged on torsemide 40 mg daily with additional 40 mg each day if weight gain > 3 lbs in 1-2 days or subjective abdominal fullness. Consider further titration as needed  [ ]  Complete last 3 days cefpodoxime??for UTI (-5/2)  [ ]  PCP - Gabapentin recently titrated 600 mg >  300mg  TID due to c/f contirubtion to weakness/confusion. May tolerate further titration  [ ]  PCP - continue to reassess pain regimen (oxy) given sedating medications in geriatric patient   [ ]  Recently s/p IV iron at previous admission, f/u hgb and iron levels   To contact the discharging attending physician with regard to the patient's hospitalization or follow-up needs, please call: [facility] of Geriatrics [phone]).   ??  Shruthi Fine??is a 80 y.o.??female??with a PMHx of TTR cardiac amyloid, COPD on 3L O2 at home, OSA, pHTN, T2DM, [name]3??who presented to [facility] with shortness of breath,??DOE, orthopnea and??abdominal??edema??for acute heart failure exacerbation.  ??  Hospital Course as detailed by problem below:  ??  Acute Heart Failure Exacerbation??- [name] 2/2 TTR Cardiac Amyloid:  Patient presented??with dyspnea on exertion, orthopnea??and PND following??recent admission for urosepsis.??Baseline weight between 210-215 pounds, weighed 228 on admission.??Heart failure exacerbation likely multifactorial in nature including infection, IVF from abx / iron and inadequate diuretics after previous admission. Repeat echo without any worsening function (LVEF > 55%).??Received [name] with Lasix and good result. Resolution of symptoms and EDW 211 lbs at discharged. Resuming home diuresis regimen of torsemide 40 mg daily with additional 40 mg if weight gain > 3 pounds over 1-2 days or worsening clinical symptoms.??  ??  COPD??(on 3L at baseline)  Patient is satting well on home 3L.??Exam not consistent with COPD exacerbation.??She was maintained on her??home therapies.??  ??  Recent Admission for Complicated UTI:  Patient was recently admitted for urosepsis and complicated UTI. She denied??any recurrent urinary symptoms??and was continued on??Cefedinir 300 mg q12??(- 5/1).??Discharged with resumption of previous cefpoxidime through 5/2.

## 2021-05-06 ENCOUNTER — Ambulatory Visit: Admit: 2021-05-06 | Discharge: 2021-05-07 | Payer: MEDICARE

## 2021-05-06 DIAGNOSIS — R531 Weakness: Principal | ICD-10-CM

## 2021-05-06 DIAGNOSIS — I5032 Chronic diastolic (congestive) heart failure: Principal | ICD-10-CM

## 2021-05-06 DIAGNOSIS — Z79891 Long term (current) use of opiate analgesic: Principal | ICD-10-CM

## 2021-05-06 DIAGNOSIS — N3000 Acute cystitis without hematuria: Principal | ICD-10-CM

## 2021-05-06 DIAGNOSIS — G253 Myoclonus: Principal | ICD-10-CM

## 2021-05-06 DIAGNOSIS — Z09 Encounter for follow-up examination after completed treatment for conditions other than malignant neoplasm: Principal | ICD-10-CM

## 2021-05-06 DIAGNOSIS — Z87440 Personal history of urinary (tract) infections: Principal | ICD-10-CM

## 2021-05-06 LAB — URINALYSIS WITH CULTURE REFLEX
BILIRUBIN UA: NEGATIVE
BLOOD UA: NEGATIVE
GLUCOSE UA: NEGATIVE
HYALINE CASTS: 17 /LPF — ABNORMAL HIGH (ref <=0)
KETONES UA: NEGATIVE
LEUKOCYTE ESTERASE UA: NEGATIVE
NITRITE UA: NEGATIVE
PH UA: 6.5 (ref 5.0–9.0)
PROTEIN UA: NEGATIVE
RBC UA: <1 /HPF (ref 0–3)
SPECIFIC GRAVITY UA: 1.01 (ref 1.005–1.030)
SQUAMOUS EPITHELIAL: 5 /HPF (ref 0–5)
UROBILINOGEN UA: 0.2
WBC UA: <1 /HPF (ref 0–3)

## 2021-05-06 NOTE — Unmapped (Signed)
REASON FOR VISIT: Hospital Follow-up Medication Management    ASSESSMENT AND PLAN:  #[name] 2/2 cardiac amyloid  - torsemide dose is stable with no extra doses, no changes recommended today     #COPD  - Spiriva, Advair  - flonase daily, montelukast daily, albuterol prn  - could consider changing to Trelegy to simplify regimen in the future; defer to PCP    #UTI  - recent admission for urosepsis, completed cefpoxidime through 5/2    #Medication Management  - Medications prescribed or ordered upon discharge were reviewed today and reconciled with the most recent outpatient medication list.  Medication reconciliation was conducted by a prescribing practitioner, or clinical pharmacist.   - Reviewed the indication, dose, and frequency of each medication with patient    Recommendations and medication-related problems were discussed directly with Dr. Rose Fillers who will see Barbara Huber immediately following this visit. Updated medication list and medication changes will be provided to the patient as a printed after visit summary.    HISTORY OF PRESENT ILLNESS:   Barbara Huber is a 80 y.o. year old female with TTR cardiac amyloid, COPD on 3L O2 at home, OSA, pHTN, T2DM, [name]3 who was recently hospitalized for acute HF exacerbation ([name] 2/2 cardiac amyloid), presents to the Internal Medicine clinic for hospital follow up.     Today, no concerns today. Has not needed any extras doses of torsemide.    No s/sx of bleeding  No s/sx of stoke    Completed cefpodoxime    Barbara Huber did not bring medication bottles, denies missed doses of medications, uses a pillbox and does not need refills today.    Patient manages medications at home.     Patient does not have to do without their medications because of cost.     DISCREPANCIES NOTED DURING MED REVIEW:  1. none    MEDICATIONS AND ALLERGIES:   Reviewed and updated in EPIC      ___________________________________________________   Barbara Huber, PharmD, CPP

## 2021-05-06 NOTE — Unmapped (Signed)
Thank you for visiting the internal medicine clinic today!    -- Please decrease your Gabapentin from 300mg  Three times daily to 300mg  nightly.   -- Please discuss decreasing your dose of Oxycontin with your pain doctor for further management of your ongoing fatigue and weakness  -- Please continue your torsemide 40mg  daily as prescribed  -- Please follow-up with your PCP for further management of your myoclonus

## 2021-05-06 NOTE — Unmapped (Signed)
[facility] BPs  BP#1 148/62   BP#2 148/63  BP#3 152/68    Average BP 149/64  (please note this as a comment in vitals)    [place] [facility] Checklist       Type of visit:  face to face    Reason for visit: UTI Follow up    Questions / Concerns that need to be addressed: followup    General Consent to Treat (GCT) for non-epic video visits only: Verbal consent    Allergies reviewed: Yes    Medication reviewed: Yes  Pended refills? No        HCDM reviewed and updated in Epic:    We are working to make sure all of our patients??? wishes are updated in Epic and part of that is documenting a Environmental health practitioner for each patient  A Health Care Decision Rodena Piety is someone you choose who can make health care decisions for you if you are not able ??? who would you most want to do this for you????          BPAs completed:  PHQ2  GAD7  AUDIT - Alcohol Screen      COVID Vaccination:  If Care Gap for COVID vaccine is present:  Have you received a COVID vaccine? yes  If yes: How many doses have you received? 0/1/2: 2   Type of Vaccine received?    Date of 1st dose:    Date of 2nd dose:    If no: Are you interested in scheduling?         __________________________________________________________________________________________    SCREENINGS COMPLETED IN FLOWSHEETS    HARK Screening  HARK Screening  Within the last year, have you been humiliated or emotionally abused in other ways by your partner or ex-partner?: No  Within the last year, have you been afraid of your partner or ex-partner?: No  Within the last year, have you been raped or forced to have any kind of sexual activity by your partner or ex-partner?: No  Within the last year, have you been kicked, hit, slapped, or otherwise physically hurt by your partner or ex-partner?: No    AUDIT  AUDIT - C Score (Part 1): 0

## 2021-05-07 NOTE — Unmapped (Signed)
I saw and evaluated the patient, participating in the key portions of the service.  I reviewed the resident???s note.  I agree with the resident???s findings and plan. Lolita Lenz, MD

## 2021-05-07 NOTE — Unmapped (Signed)
Hello Barbara Huber,  Your urine culture collected yesterday shows no signs of infection. Given your additional lack of symptoms yesterday. No further workup is warranted at this time.   Thank you!

## 2021-05-16 DIAGNOSIS — N390 Urinary tract infection, site not specified: Principal | ICD-10-CM

## 2021-05-16 NOTE — Unmapped (Signed)
Thank you for your e-consult request.     After spending 5-10 minutes reviewing this patient's medical records and considering your question, I believe it is most appropriate for them to be seen and evaluated in the [facility] by one of our providers.    I will pass the patient's contact information to the ID Clinic front desk team for scheduling.    In general I like to go into a detailed history with these folks. We can get her in quickly.     If you have questions or concerns about this plan, please feel free to contact me through Epic by staff message or chat.    Wandalee Ferdinand [facility], MD, PhD  Clinical Associate Professor  [facility] Infectious Diseases  Clinic Phone: [phone]      The recommendations provided in this eConsult are based on the clinical data available to me and are furnished without the benefit of a comprehensive in-person evaluation of the patient. Any new clinical issues or changes in patient status not available to me will need to be taken into account when assessing these recommendations. The ongoing management of this patient is the responsibility of the referring clinician. Please contact me if you have further questions.

## 2021-05-16 NOTE — Unmapped (Signed)
Pt with frequent UTIs. 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   Asymptomatic.   Is taking estrogen cream.   Given frequent UTIs, VRE will e-consult ID. Pt in agreement. NO treatment at this time.

## 2021-05-22 NOTE — Unmapped (Signed)
[facility] Refill Coordination Note    Specialty Medication(s) to be Shipped:   General Specialty: Vyndamax 61mg     Other medication(s) to be shipped: No additional medications requested for fill at this time     Barbara Huber, DOB: 1941/03/13  Phone: [phone] (home)       All above HIPAA information was verified with patient.     Was a Nurse, learning disability used for this call? No    Completed refill call assessment today to schedule patient's medication shipment from the [facility] [phone]).  All relevant notes have been reviewed.     Specialty medication(s) and dose(s) confirmed: Regimen is correct and unchanged.   Changes to medications: Barbara Huber reports no changes at this time.  Changes to insurance: No  New side effects reported not previously addressed with a pharmacist or physician: None reported  Questions for the pharmacist: No    Confirmed patient received a Conservation officer, historic buildings and a Surveyor, mining with first shipment. The patient will receive a drug information handout for each medication shipped and additional FDA Medication Guides as required.       DISEASE/MEDICATION-SPECIFIC INFORMATION        N/A    SPECIALTY MEDICATION ADHERENCE     Medication Adherence    Patient reported X missed doses in the last month: 0  Specialty Medication: Vyndamax 61mg   Patient is on additional specialty medications: No  Patient is on more than two specialty medications: No              Were doses missed due to medication being on hold? No    Vyndamax 61 mg: 10 days of medicine on hand       REFERRAL TO PHARMACIST     Referral to the pharmacist: Not needed      [facility]     Shipping address confirmed in Epic.     Delivery Scheduled: Yes, Expected medication delivery date: 05/29/21.     Medication will be delivered via Same Day Courier to the prescription address in Epic WAM.    Nancy Nordmann [facility] Specialty Technician

## 2021-05-23 ENCOUNTER — Ambulatory Visit: Admit: 2021-05-23 | Discharge: 2021-05-23 | Payer: MEDICARE

## 2021-05-23 DIAGNOSIS — J9611 Chronic respiratory failure with hypoxia: Principal | ICD-10-CM

## 2021-05-23 DIAGNOSIS — I272 Pulmonary hypertension, unspecified: Principal | ICD-10-CM

## 2021-05-23 DIAGNOSIS — J449 Chronic obstructive pulmonary disease, unspecified: Principal | ICD-10-CM

## 2021-05-23 NOTE — Unmapped (Signed)
[facility]  Pulmonary Hypertension Program                        H. Sherre Lain, MD--Director (Pulmonary)  Denice Bors. [name], MD--Associate Director (Pulmonary)  Freeman Caldron, MD (Cardiology)  Bonney Leitz, MD (Pulmonary)    [name], RN Nurse Coordinator  Claretha Cooper, RN Nurse Coordinator      [phone] office  [phone] fax            [name]  is a 80 y.o. female patient referred for work up and evaluation for pulmonary hypertension, COPD, and chronic hypoxemic respiratory failure.    Assessment:      Barbara Huber is a 80 y.o.female with COPD and chronic hypoxemic respiratory failure. She was previously referred to me for pulmonary hypertension, though this is now clearly dominant left heart/group 2 pathology (with small precapillary component, either secondary to [name] versus COPD).  ??  Overall doing well over the last several months, no COPD related admissions though did have decompensated [name] requiring additional diuresis. PFTs today show FEV1 that has increased by 10-15% from prior, which is excellent, and she has not have high acute bronchodilator needs. Overall she is benefiting from current inhaler plan (non-DPI focused), as well as daytime oxygen/nocturnal NIPPV, and heart failure care.      Plan:         - Continue non-DPI inhaler plan (Spiriva Respimat and Advair HFA) for improved drug delivery of COPD therapies in setting of impaired lung function. Albuterol use should be PRN.  - With next visit, can discuss change to LAMA/LABA (Stiolto) alone if remains free of exacerbations, if patient interested.  - Continue Trilogy plus oxygen (4LPM) at night unchanged. I had previously discussed testing options with sleep lab, if this would be needed to adjust settings and/or qualify patient for higher flow oxygen concentrator (above 5LPM). She would be able to have sleep study using her Trilogy/AVAPS settings, would just require specific planning to also include RT presence, and patient would need to bring home Trilogy with her. Will hold off on this for now given improvements.  - Continue PT at home for now, discussed formal PR once this is completed, she will think about this more.  - Diuretic dosing as per Dr. Wonda Cheng and heart failure team.  ??  ??  Followup in 6 months.       [name], MD  05/23/21  Subjective:        80 y.o. 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 Meredeth Ide. Recalls COPD diagnosis dating back more than 10 years ago, no history of asthma or other lung conditions. Overall it sounds like she has been maintained on triple inhaler therapy (Advair diskus,  Spiriva handihaler), with exacerbations averaging maybe once/year, usually not requiring hospitalization. Albuterol PRN via MDI (tries to use BID by routine) or neb (rare use). In 06/2019 she was admitted to [facility] with left sided pneumonia, also treated for COPD exacerbation with steroids during this, course also complicated by AKI and Afib/RVR. Hypercapnic during admission and started on Trilogy for discharge. She also carries diagnosis of CHF, made a few years ago, at least 2 admissions (usually [place]) for diuresis. With recent admission did have very elevated proBNPs and discharged on higher dose of Lasix (40 mg daily). Denies recent edema on this higher dose of diuretic. No orthopnea. She has not been doing a lot of physical activity during COVID, will notice very mild dyspnea with self care, no exertional CP or LH. Does find neuropathy can hinder her activity as well. Occasional cough when not having exacerbation, clear sputum. Did have hemoptysis with pneumonia, none outside of this. With first visit we discussed recommendation to repeat CXR, and also repeat TTE in stable outpatient state.    Subsequent history: At visit 10/2019 doing okay, occasional episodes of DOE but overall happy with level of function. Discussed potential for clinical trial enrollment, but she was feeling very well and did not want to do invasive testing unless changing. Admitted to [facility] 04/2020 for dyspnea and decompensated heart failure, diuresed and also given IV iron. At followup 06/2020 not quite back at baseline, but improving steadily. She was then admitted 07/2020 again for heart failure, and then again in 09/2020 for two weeks with decompensated heart failure, Afib/RVR, septic shock, and gangrenous cholecystitis. Shortly thereafter she was diagnosed with ATTR cardiac amyloid. Admitted 12/2020 with dyspnea and decompensated heart failure, diuresed and underwent DCCV. At followup that month slowing improving since discharge, though more hypoxemic than baseline, discussed anemia/iron and changed inhalers to non-DPI formulations. At followup 01/2021 feeling much better, more active, started tafamidis, no additional changes made.    Interval history 04/2021: Since last visit  Overall doing well from cardiopulmonary standpoint.   Two hospitalizations last month, for UTI, then dyspnea in setting of fluid weight gain.  Continues on Advair HFA and Spiriva, no albuterol needs at all.  - No cough or wheeze.  Bothered by tremulousness and poor appetite, no weight loss.  Working with PT once a week at home.  Some fatigue, and pain from neuropathy.  Sleeping well with Trilogy at night, benefitting from this.  Doing well on the tafamidis.       Past medical, past surgical, family, and social histories reviewed and updated in Epic.    Review of systems is significant for:   A 12 point review of systems was negative except for pertinent items noted in the HPI.    Allergies   Allergen Reactions   ??? Nitrofurantoin Anaphylaxis and Other (See Comments)   ??? Lipitor [Atorvastatin] Muscle Pain     SOB, Headache, fatigue, sick on my stomach      Current Outpatient Medications   Medication Sig Dispense Refill   ??? albuterol HFA 90 mcg/actuation inhaler Inhale 2 puffs every eight (8) hours as needed for wheezing.     ??? amiodarone (PACERONE) 200 MG tablet Take 1 tablet (200 mg total) by mouth daily. 30 tablet 1   ??? apixaban (ELIQUIS) 5 mg Tab Take 1 tablet (5 mg total) by mouth Two (2) times a day. 60 tablet 11   ??? fluticasone propion-salmeteroL (ADVAIR HFA) 115-21 mcg/actuation inhaler Inhale 2 puffs Two (2) times a day.  12 g 11   ??? montelukast (SINGULAIR) 10 mg tablet Take 1 tablet (10 mg total) by mouth daily. 90 tablet 3   ??? OXYGEN-AIR DELIVERY SYSTEMS MISC 5 L by Miscellaneous route.     ??? rosuvastatin (CRESTOR) 5 MG tablet Take 1 tablet (5 mg total) by mouth every other day. 3015 tablet 11   ??? spironolactone (ALDACTONE) 50 MG tablet Take 1 tablet (50 mg total) by mouth daily. 90 tablet 3   ??? tafamidis 61 mg cap Take 1 capsule (61 mg) by mouth daily. 30 capsule 11   ??? tiotropium bromide (SPIRIVA RESPIMAT) 2.5 mcg/actuation inhalation mist Inhale 2 puffs daily. 4 g 11   ??? torsemide (DEMADEX) 20 MG tablet Take 2 tablets (40 mg total) by mouth daily. May also take 2 tablets (40 mg total) daily as needed (weight gain). 60 tablet 11   ??? ACCU-CHEK AVIVA PLUS TEST STRP Strp USE TO CHECK BLOOD SUGAR 3 TIMES A DAY BEFORE MEALS     ??? acetaminophen (TYLENOL) 500 MG tablet Take 1,000 mg by mouth daily as needed for pain.      ??? estradioL (ESTRACE) 0.01 % (0.1 mg/gram) vaginal cream INSERT ONE GRAM VAGINALLY AT BEDTIME     ??? gabapentin (NEURONTIN) 300 MG capsule Take 1 capsule (300 mg total) by mouth Three (3) times a day. 90 capsule 0   ??? lancets Misc 1 each by Miscellaneous route daily. Accu Check 100 each 6   ??? metFORMIN (GLUCOPHAGE) 500 MG tablet Take 1 tablet (500 mg total) by mouth 2 (two) times a day with meals. 180 tablet 3   ??? NARCAN 4 mg/actuation nasal spray 1 spray into alternating nostrils once as needed (opioid overdose).  (Patient not taking: Reported on 05/06/2021)     ??? oxyCODONE (OXYCONTIN) 10 mg TR12 12 hr crush resistant ER/CR tablet Take 10 mg by mouth every twelve (12) hours.     ??? varicella-zoster [id number], PF, (SHINGRIX, PF,) 50 mcg/0.5 mL SusR injection Inject 0.5 mL into the muscle. 0.5 mL 1     No current facility-administered medications for this visit.   f        Objective:   Objective     Physical Exam:   BP 109/48  - Pulse 69  - Temp 36.6 ??C (97.9 ??F) (Temporal)  - Ht 163.4 cm (5' 4.33)  - Wt 95.7 kg (211 lb)  - SpO2 99%  - BMI 35.85 kg/m??   Body mass index is 35.85 kg/m??.    Wt Readings from Last 6 Encounters:   05/23/21 95.7 kg (211 lb)   05/06/21 97.8 kg (215 lb 9.6 oz)   04/25/21 96 kg (211 lb 11.2 oz)   04/21/21 (!) 101.4 kg (223 lb 8 oz)   04/04/21 97.6 kg (215 lb 3.2 oz)   03/21/21 96.2 kg (212 lb)     General Appearance:    Alert, cooperative, no distress, appears stated age.   HEENT:    Normocephalic, without obvious abnormality, atraumatic. PERRL, conjunctiva clear. Nares normal, no drainage, no sinus tenderness. Oropharynx with no lesions. No facial telangectasias.   Neck:    Supple, trachea midline, no adenopathy.    Lungs:    Distant breath sounds b/l, no crackles or wheezes, respirations unlabored. No PA bruits.   Chest wall:   Expands symmetrically, no tenderness or deformity.   Cardiovascular:   Regular rate and rhythm, S1 and S2 normal, P2 not overly prominent, no murmur, rubs, or gallop appreciated. Neck exam  with prominent carotid impulse, JVD not clearly visible when upright.   Abdomen:   Soft, non-tender, normal bowel sounds, no masses, no organomegaly   Extremities:     Extremities normal, no cyanosis, clubbing. Trace pitting edema. No sclerodactyly.   Skin:   No rashes or lesions.   Neuro/psych:      No focal neurologic deficits; mood/affect normal..     Diagnostic Review:    Cardiac testing summary:  TTE 04/24/21:   RHC 01/09/21: RA 23, PA 85/40 (55), PCW 32       CO/CI 5.3/2.5 (f,td), PA sat 57%, PVR 4.2 WU  TTE 09/2020: LA mild dil, EF >55%       RV ULN size, HK, tr TR, can't est PASP       No effusion. IVC dil, poor insp collapse  TTE 04/2020: LA 41, EF >55%, LVH (16)       RV normal, mild HK, TAPSE 25, can't est PASP       No effusion. IVC normal.  TTE 10/2019: LA 42, EF >55%       RV mild dil/HK, can't est PASP       No effusion. IVC dil, poor insp collapse  TTE 06/2019: LA 38, EF 65-70%, grade 2 DD, LVH (13-14)       RV mild dil, low nl fxn, TAPSE 17, mild-mod Tr, est PASP 64+CVP       Tr effusion. IVC nl size, poor insp collapse  TTE 04/2018 (cone): LA 40, EF 60-65%, grade 1 DD       RV normal, mild TR, est PASP within the normal range       No effusion. IVC normal    CXR 08/2020: cardiomegaly       Pulmonary vascular congestion with increased markings       Hyperinflated lungs, flat diagphragms  CXR 04/2020: cardiomegaly       Hazy interstitial opacities with vascular congestion       Likely trace L effusion.  CXR 08/2019: ??stable cardiomegaly       Interval resolution of LLL consolidation.       Stable parenchymal scarring in the left midlung zone.       Interval decreased now trace left pleural effusion.    CTA chest 10/2018 (cone): Cardiovascular: There is no demonstrable pulmonary embolus. There is  no thoracic aortic aneurysm or dissection. There is calcification at  the origin of the left subclavian artery. Other visualized great  vessels appear unremarkable. There is aortic atherosclerosis. There  are foci of coronary artery calcification evident. There is a small  amount of pericardial fluid, within the physiologic range, stable.  Pericardium does not appear appreciably thickened.  Prominence of the main pulmonary outflow tract is noted with a  measured diameter of 3.7 cm.  Mediastinum/Nodes: There is a subcentimeter nodular opacity in the  left lobe of the thyroid, stable. There are multiple subcentimeter  mediastinal lymph nodes again noted. There are prominent right  paratracheal lymph nodes with largest lymph node in this area  measuring 1.8 x 1.4 cm, marginally smaller than on prior study.  Aortopulmonary window lymph nodes appear slightly smaller than on  previous study. No new lymph node prominence is evident on this  study. No esophageal lesions are evident.  Lungs/Pleura: There is atelectatic change in the lung base regions,  stable. There is no frank edema or consolidation. A mild degree of  centrilobular emphysematous change appears stable. No appreciable  pleural effusion.  Upper  Abdomen: There is a cyst in the medial upper pole right kidney  measuring 1.5 x 1.5 cm. A cyst is noted in the lateral mid left  kidney measuring 1.5 x 1.3 cm. There is aortic atherosclerosis as  well as calcification in the proximal visualized great vessels.  Visualized upper abdominal structures otherwise appear unremarkable.  Musculoskeletal: There is stable anterior wedging of the L1  vertebral body. There is multilevel degenerative change in the  thoracic spine which appear stable. No blastic or lytic bone lesions  are evident. No evident chest wall lesions.     CT chest 04/2018 (cone): Cardiovascular: Mild cardiomegaly. Small pericardial  effusion/thickening. Left anterior descending and right coronary  atherosclerosis. Atherosclerotic nonaneurysmal thoracic aorta.  Dilated main pulmonary artery (3.6 cm diameter). No central  pulmonary emboli.  Mediastinum/Nodes: Subcentimeter hypodense posterior left thyroid  lobe nodule. Unremarkable esophagus. No axillary adenopathy.  Enlarged right paratracheal nodes up to 1.6 cm (series 2/image 54).  Enlarged 1.1 cm AP window node (series 2/image 51). No  pathologically enlarged hilar nodes.  Lungs/Pleura: No pneumothorax. Trace dependent left pleural  effusion. No right pleural effusion. No acute consolidative airspace  disease, lung masses or significant pulmonary nodules. Minimal  interlobular septal thickening throughout both lungs. Several mildly  thickened parenchymal bands in the dependent right middle lobe,  lingula and right lower lobe. No significant regions of  bronchiectasis. Mild centrilobular emphysema with mild diffuse  bronchial wall thickening.  Upper abdomen: Partially visualized simple 1.6 cm medial upper right  renal cyst. Mild scarring in the visualized upper right kidney.  Musculoskeletal: No aggressive appearing focal osseous lesions.  Stable mild chronic L1 vertebral compression fracture. Moderate  thoracic spondylosis.     V/Q scan : none    08/2019: 152 meters. HR 79->101, O2 sat 91->88% on 3LPM, needing 4LPM. Max Borg 2.    Pulmonary Function Testing:  Date FEV1 FVC TLC FRC RV DLCO    05/23/21  1.12 (66%)  1.91 (86%)        08/2019  0.99 (56%)  1.83 (79%)     5.0 (26%)    04/2018  0.98 (59%)  1.88 (89%)  68%   63%   (29%)            Today's PFTs c/w mod obstruction, improved from prior.    Overnight oximetry 08/2019: 20 min <89% (total time 6.5 hours), avg 90%, nadir 94%       Compliance data with very good usage  PSG : done in past but not available     Relevant Serologies:   Labs 08/2019: ANA pos 1:160 homogenous, ENA neg, RF/CCP neg  Labs 12/2018: ANCA neg, ACE 31    Routine Labs:     ??  Date NTproBNP Creatinine  Potassium  Bicarbonate  Hemoglobin  abs eos             BNP 567 1.5 4.1  31  8.2  200    12/2020 BNP 926->479 1.3 3.8  33  8.9 ??   11/2020  3800   BNP 567 ?? ?? ?? ?? ??    07/2020  BNP 603 ?? ?? ?? ?? ??    05/2020  1970  1.3  4.3  32  11.2 ??   10/2019  2220  1.1 ??  34 ?? ??    08/2019  1410 07/2019  1270  0.9  4.1  29  10.0     06/2019  6600->13K ?? ?? ?? ?? ??  11/2018  BNP 253 ?? ?? ?? ?? ??    08/2018  BNP 531

## 2021-05-23 NOTE — Unmapped (Signed)
Thank you for visiting the [facility]pert[facility].    If you have any questions or concerns, please call     Haze Justin, MD  [name], RN, Pulmonary Hypertension Nurse Coordinator  Claretha Cooper, RN, Pulmonary Hypertension Nurse Coordinator    [phone].

## 2021-05-28 ENCOUNTER — Ambulatory Visit: Admit: 2021-05-28 | Discharge: 2021-05-29 | Payer: MEDICARE

## 2021-05-28 DIAGNOSIS — L299 Pruritus, unspecified: Principal | ICD-10-CM

## 2021-05-28 DIAGNOSIS — N1831 Stage 3a chronic kidney disease (CMS-HCC): Principal | ICD-10-CM

## 2021-05-28 DIAGNOSIS — R351 Nocturia: Principal | ICD-10-CM

## 2021-05-28 DIAGNOSIS — N1832 Type 2 diabetes mellitus with stage 3b chronic kidney disease, without long-term current use of insulin (CMS-HCC): Principal | ICD-10-CM

## 2021-05-28 DIAGNOSIS — E1122 Type 2 diabetes mellitus with diabetic chronic kidney disease: Principal | ICD-10-CM

## 2021-05-28 DIAGNOSIS — N390 Urinary tract infection, site not specified: Principal | ICD-10-CM

## 2021-05-28 LAB — BASIC METABOLIC PANEL
ANION GAP: 6 mmol/L (ref 5–14)
BLOOD UREA NITROGEN: 60 mg/dL — ABNORMAL HIGH (ref 9–23)
BUN / CREAT RATIO: 23
CALCIUM: 9.6 mg/dL (ref 8.7–10.4)
CHLORIDE: 98 mmol/L (ref 98–107)
CO2: 29.6 mmol/L (ref 20.0–31.0)
CREATININE: 2.65 mg/dL — ABNORMAL HIGH
EGFR CKD-EPI (2021) FEMALE: 18 mL/min/{1.73_m2} — ABNORMAL LOW (ref >=60)
GLUCOSE RANDOM: 152 mg/dL (ref 70–179)
POTASSIUM: 4.9 mmol/L — ABNORMAL HIGH (ref 3.4–4.8)
SODIUM: 134 mmol/L — ABNORMAL LOW (ref 135–145)

## 2021-05-28 MED ORDER — FOSFOMYCIN TROMETHAMINE 3 GRAM ORAL PACKET
ORAL | 1 refills | 84.00000 days | Status: CP
Start: 2021-05-28 — End: 2021-08-14

## 2021-05-28 NOTE — Unmapped (Signed)
ID Clinic Note - Established Patient Visit    ASSESSMENT:  Barbara Huber is a 80 y.o. 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 Brooke Dare for frequent UTIs.??    Already doing many of the behavioral preventative measures to help prevent UTIs, and planning to re-establish with Urology at [facility] at the end of the month. DM well controlled on metformin (last A1c 5.5% on 04/04/2021). No issues with urinary incontinence at baseline. H/o nephrolithiasis but negative CT in Jan 2022. It seems like she is having truly symptomatic UTIs (>5 in the last year) that have led to 2 hospitalizations in the last 6 months. Therefore, I think a re-trial of a prophylactic antibiotic would be reasonable. Previously tolerated doxycycline back in 2017-2018, but as she has grown an MDR E.coli resistant to tetracyclines in the last 6 months, would recommend fosfomycin ppx. This will also cover the VRE she grew in 04/2021 (although should be noted that she was asymptomatic at the time of this culture. We discussed risks and benefits of further development of resistance and both her and her daughter would like to proceed.    Re: itching - no rash visible on exam. Will check BMP to r/o uremia.     RECOMMENDATIONS:  - START fosfomycin 3g weekly.  - Continue estrogen cream and cranberry supplement as prescribed by Urology in [facility] appt as scheduled for end of the month  - Follow-up by phone visit in 1 month, then in person in 3 months.  - Check BMP today to monitor Cr and for work-up of itching.  - Avoid benadryl as on [facility] list, cetirizine would be reasonable alternative  - Recommend follow-up with PCP re: itching.    ID Problem List:  ?? Frequent UTIs  ?? 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 Gangrenous cholecystitis s/p perc cholecystostomy tube 10/02/20 w/ cx + clostridium perfringens and E coli - seen by ID while inpatient  ??  RELEVANT NON-ID DIAGNOSES:  ?? 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 Atrial fibrillation on Eliquis  ?? T2DM  ?? [name]  ?? OSA    Education and counseling took 15 minutes of today's 35 minute visit.    [name], MD, MHS  Assistant Professor, [facility] of [facility]ious Diseases    [facility]   [address], 5th floor  [place], [place] [address]  Phone: [phone]   Fax: [phone]     _____________________________________________________________________      CC: recurrent UTI      HPI: 80 y.o. 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 Brooke Dare for frequent UTIs.??     Most recently hospitalized in April 2022 for sepsis 2/2 complicated UTI (symptomatic with hesitancy, fevers, chills, and pyuria). Treated with IV ceftriaxone -->cefpodoxime x 7 days. Urine culture was no growth, but per pt had recently been on amox/clav (although no record of this in our system). Admitted a few days later for CHF exacerbation, treated with cefdinir inpatient, then completed cefpodoxime course at discharge. At hospital follow-up visit on 5/10, she insisted on repeat U/A + reflex culture for clearance despite provider recommending against this. U/A without pyuria, LE or nitrites, but reflexed (possibly because of many bacteria)  and grew VRE. As she was asymptomatic, no treatment was given and an e-consult was made to ID for recurrent UTI, for which a full consult in clinic was recommended.    In regard to her UTI history, she has been hospitalized twice in the last year for UTI specifically. She has had 6-7 symptomatic UTIs total in the last year. Her usual symptoms include urinary frequency, sometimes urgency, sometimes, dysuria, and foul-smelling urine. Sometimes, when she needs to be hospitalized, she will develop fevers and shaking. Usually comes on all of a sudden.    For prevention, she is using estrogen cream and is taking a cranberry supplement per her urologist in [place]. She wipes front to back with pampers wipes. Wears cotton underwear. Denies incontinence. Her risk factors include history of nephrolithiasis and DM (although not issues recently). Has been on doxycycline for prophylaxis back in 2017-2018 per [facility] Urology and saw Dr. Sampson Goon (ID in [place]) once back in 2017.    Recent cultures grew E.coli, susceptible to all abx tested (02/26/21) --> treated with TMP/sulfa (02/26/21) and cefdinir (02/05/21). U/As consistent with infection with both these instances. Also grew E.coli, Aerococcus urinae, and Enterococcus faecalis from the same UCx when hospitalized for sepsis in Jan 2022. Has a history of MDR E.coli (S: to carbapenems, gentamicin, tobramycin, and nitrofurantoin) 01/03/21, 09/28/20, 08/03/20 and Klebsiella oxytoca in 04/2020 (S to all tested except R to ampicillin, I to cefazolin). In remote past has grown Proteus mirabilis and E.coli with a different susceptibility pattern.    Currently, she denies any dysuria, fevers, frequency, urgency, or suprapubic pain.    Past Medical History:   Diagnosis Date   ??? Acute kidney injury superimposed on chronic kidney disease (CMS-HCC) 10/11/2020   ??? Acute on chronic diastolic (congestive) heart failure (CMS-HCC) 08/23/2020   ??? Arthritis    ??? Calculus of kidney    ??? Calculus of ureter    ??? CHF (congestive heart failure) (CMS-HCC)    ??? Chronic atrial fibrillation (CMS-HCC) 07/20/2019   ??? COPD (chronic obstructive pulmonary disease) (CMS-HCC)    ??? Diabetes (CMS-HCC)    ??? Gangrenous cholecystitis 10/11/2020   ??? GERD (gastroesophageal reflux disease)    ??? Hydronephrosis    ??? Hypertension    ??? Hyponatremia 10/11/2020   ??? Intermediate coronary syndrome (CMS-HCC) 03/13/2014   ??? Lower extremity edema 09/28/2020   ??? Lumbar stenosis    ??? Microscopic hematuria    ??? Nausea alone    ??? Neuropathy    ??? Nocturia    ??? Other chronic cystitis    ??? Pulmonary hypertension (CMS-HCC) ??? Renal colic    ??? Sleep apnea    ??? Unstable angina pectoris (CMS-HCC) 03/13/2014       Past Surgical History:   Procedure Laterality Date   ??? BACK SURGERY  1995   ??? CARPAL TUNNEL RELEASE Left 2014   ??? HYSTERECTOMY  1971   ??? IR INSERT CHOLECYSTOSMY TUBE PERCUTANEOUS  10/02/2020    IR INSERT CHOLECYSTOSMY TUBE PERCUTANEOUS 10/02/2020 Braulio Conte, MD IMG VIR H&V [facility]   ??? LUMBAR DISC SURGERY     ??? PR REMOVAL GALLBLADDER N/A 10/06/2020    Procedure: CHOLECYSTECTOMY;  Surgeon: Katherina Mires, MD;  Location: MAIN OR [facility];  Service: Trauma   ??? PR RIGHT HEART CATH O2 SATURATION & CARDIAC OUTPUT N/A 09/30/2020    Procedure: Right Heart Catheterization;  Surgeon: Neal Dy, MD;  Location: [facility] CATH;  Service: Cardiology   ??? PR RIGHT HEART  CATH O2 SATURATION & CARDIAC OUTPUT N/A 01/09/2021    Procedure: Right Heart Catheterization;  Surgeon: Lesle Reek, MD;  Location: [facility] CATH;  Service: Cardiology       Allergies   Allergen Reactions   ??? Nitrofurantoin Anaphylaxis and Other (See Comments)   ??? Lipitor [Atorvastatin] Muscle Pain     SOB, Headache, fatigue, sick on my stomach       Current Outpatient Medications   Medication Sig Dispense Refill   ??? ACCU-CHEK AVIVA PLUS TEST STRP Strp USE TO CHECK BLOOD SUGAR 3 TIMES A DAY BEFORE MEALS     ??? acetaminophen (TYLENOL) 500 MG tablet Take 1,000 mg by mouth daily as needed for pain.      ??? albuterol HFA 90 mcg/actuation inhaler Inhale 2 puffs every eight (8) hours as needed for wheezing.     ??? amiodarone (PACERONE) 200 MG tablet Take 1 tablet (200 mg total) by mouth daily. 30 tablet 1   ??? apixaban (ELIQUIS) 5 mg Tab Take 1 tablet (5 mg total) by mouth Two (2) times a day. 60 tablet 11   ??? estradioL (ESTRACE) 0.01 % (0.1 mg/gram) vaginal cream INSERT ONE GRAM VAGINALLY AT BEDTIME     ??? fluticasone propion-salmeteroL (ADVAIR HFA) 115-21 mcg/actuation inhaler Inhale 2 puffs Two (2) times a day. 12 g 11   ??? lancets Misc 1 each by Miscellaneous route daily. Accu Check 100 each 6   ??? metFORMIN (GLUCOPHAGE) 500 MG tablet Take 1 tablet (500 mg total) by mouth 2 (two) times a day with meals. 180 tablet 3   ??? montelukast (SINGULAIR) 10 mg tablet Take 1 tablet (10 mg total) by mouth daily. 90 tablet 3   ??? NARCAN 4 mg/actuation nasal spray 1 spray into alternating nostrils once as needed (opioid overdose).     ??? oxyCODONE (OXYCONTIN) 10 mg TR12 12 hr crush resistant ER/CR tablet Take 10 mg by mouth every twelve (12) hours.     ??? OXYGEN-AIR DELIVERY SYSTEMS MISC 5 L by Miscellaneous route.     ??? rosuvastatin (CRESTOR) 5 MG tablet Take 1 tablet (5 mg total) by mouth every other day. 3015 tablet 11   ??? spironolactone (ALDACTONE) 50 MG tablet Take 1 tablet (50 mg total) by mouth daily. 90 tablet 3   ??? tafamidis 61 mg cap Take 1 capsule (61 mg) by mouth daily. 30 capsule 11   ??? tiotropium bromide (SPIRIVA RESPIMAT) 2.5 mcg/actuation inhalation mist Inhale 2 puffs daily. 4 g 11   ??? torsemide (DEMADEX) 20 MG tablet Take 2 tablets (40 mg total) by mouth daily. May also take 2 tablets (40 mg total) daily as needed (weight gain). 60 tablet 11   ??? varicella-zoster [id number]-[id number], PF, (SHINGRIX, PF,) 50 mcg/0.5 mL SusR injection Inject 0.5 mL into the muscle. 0.5 mL 1   ??? fosfomycin (MONUROL) 3 gram Pack Take 3 g by mouth once a week for 12 doses. 36 g 1   ??? gabapentin (NEURONTIN) 300 MG capsule Take 1 capsule (300 mg total) by mouth Three (3) times a day. 90 capsule 0     No current facility-administered medications for this visit.       Social history:  H/o 2 vaginal births. Hysterectomy + oopherectomy 50 years ago. No tobacco use.    Social History     Social History Narrative    Previously worked in Designer, fashion/clothing, retired.  Daughter is Economist who works at [employer].  Her niece is Selena Batten, [employer] Anesthesia Tech.  Lives in [place] with daughter and son-in-law.       Family History   Problem Relation Age of Onset   ??? Hypertension Mother    ??? Cancer Father COLON CANCER   ??? Anesthesia problems Neg Hx    ??? Broken bones Neg Hx    ??? Clotting disorder Neg Hx    ??? Collagen disease Neg Hx    ??? Diabetes Neg Hx    ??? Dislocations Neg Hx    ??? Fibromyalgia Neg Hx    ??? Gout Neg Hx    ??? Hemophilia Neg Hx    ??? Osteoporosis Neg Hx    ??? Rheumatologic disease Neg Hx    ??? Scoliosis Neg Hx    ??? Severe sprains Neg Hx    ??? Sickle cell anemia Neg Hx    ??? Spinal Compression Fracture Neg Hx    ??? GU problems Neg Hx    ??? Kidney cancer Neg Hx    ??? Prostate cancer Neg Hx        ROS: 12 systems reviewed and negative except as per HPI.     PE:  BP 111/47 (BP Site: L Arm, BP Position: Sitting, BP Cuff Size: Medium)  - Pulse 69  - Temp 37.1 ??C (98.8 ??F) (Oral)  - Ht 162.6 cm (5' 4)  - BMI 36.22 kg/m??   CONSTITUTIONAL: alert, well-appearing, in no acute distress  EYES: anicteric conjunctivae, EOMI  ENT: moist mucous membranes, no oral lesions  LYMPHATIC: no inguinal lymphadenopathy   RESPIRATORY: good inspiratory effort, normal WOB  CARDIOVASCULAR: regular rate, no LE edema  GASTROINTESTINAL: normal bowel sounds, soft without tenderness, masses or hepatosplenomegaly  GU: no suprapubic or CVA tenderness. Normal external genitalia with prominent skin folds in the area  SKIN: no rashes or lesions on limited exam even in areas that are itchy. No candida intertrigo in groin  MSK: normal ROM in all extremities, no edema, no joint effusions   NEUROLOGICAL: no nuchal rigidity, alert and oriented x3  PSYCH: affect congruent with stated mood    Labs:    Lab Results   Component Value Date    WBC 11.8 (H) 04/25/2021    WBC 11.0 04/24/2021    WBC 12.5 (H) 01/06/2011    Absolute Neutrophils 9.2 (H) 04/23/2021    Absolute Lymphocytes 1.0 (L) 04/23/2021    Absolute Eosinophils 0.2 04/23/2021    HGB 9.3 (L) 04/25/2021    HGB 12.9 01/06/2011    HCT 29.5 (L) 04/25/2021    HCT 40.8 01/06/2011    Platelet 332 04/25/2021    Platelet 302 04/24/2021    Platelet 260 01/06/2011    Sodium 139 04/25/2021    Sodium 140 01/06/2011    Potassium 4.0 04/25/2021    Potassium 4.1 01/06/2011    BUN 31 (H) 04/25/2021    BUN 20 01/06/2011    Creatinine 1.42 (H) 04/25/2021    Creatinine 1.39 (H) 04/24/2021    Creatinine 0.79 01/06/2011    Glucose 148 04/25/2021    Magnesium 2.2 04/25/2021    Magnesium 2.0 01/06/2011    T Albumin 3.7 11/29/2020    Albumin 3.3 (L) 04/23/2021    Total Bilirubin 0.4 04/23/2021    AST 14 04/23/2021    ALT 11 04/23/2021    Alkaline Phosphatase 75 04/23/2021    INR 2.27 09/27/2020    INR 1.0 01/06/2011    Sed Rate 21 04/28/2020    CRP 9.1 04/27/2020       Urinalysis:  Lab Results   Component Value Date    WBC, UA 5 09/10/2014    RBC, UA 2 09/10/2014    Squam Epithel, UA 5 05/06/2021    Squam Epithel, UA 2 09/10/2014    Bacteria, UA Many (A) 05/06/2021    Bacteria, UA FEW 09/10/2014    Leukocyte Esterase, UA Negative 05/06/2021    Leukocyte Esterase, UA TRACE 09/10/2014    Nitrite, UA Negative 05/06/2021    Nitrite, UA POSITIVE 09/10/2014    Protein, UA Negative 05/06/2021    Protein, UA NEGATIVE 09/10/2014    Blood, UA Negative 05/06/2021    Blood, UA NEGATIVE 09/10/2014    pH, UA 6.5 05/06/2021    pH, UA 6.0 09/10/2014       Microbiology:  Urine cultures  05/06/21 (asymptomatic with bland U/A)  50-100K VRE   KIRBY BAUER     Ampicillin Resistant 1     Doxycycline Susceptible     Nitrofurantoin Resistant     Vancomycin Screening Plate Resistant      04/18/21 NG    02/26/21, 02/05/21,   Escherichia coli       MIC SUSCEPTIBILITY RESULT     Ampicillin Susceptible     Cefazolin Susceptible     Cephalexin Susceptible 1     Ciprofloxacin Susceptible     Gentamicin Susceptible     Levofloxacin Susceptible     Nitrofurantoin Susceptible     Piperacillin + Tazobactam Susceptible     Tetracycline Susceptible 2     Tobramycin Susceptible     Trimethoprim + Sulfamethoxazole Susceptible      01/21/21  >100,000 CFU/mL Escherichia coli??Abnormal??    Presumptive Identification   >100,000 CFU/mL Enterococcus faecalis??Abnormal Susceptibility Testing By Consultation Only   Processed at Physicians Request   >100,000 CFU/mL Aerococcus urinae??Abnormal??    Aerococcus spp. are predictably susceptible to penicillin, ampicillin, tetracycline, and vancomycin. Resistance to sulfonamides has been reported.       Escherichia coli Enterococcus faecalis       MIC SUSCEPTIBILITY RESULT KIRBY BAUER     Ampicillin Susceptible Susceptible 1     Cefazolin Susceptible      Cephalexin Susceptible 2      Ciprofloxacin Susceptible      Doxycycline  Susceptible     Gentamicin Susceptible      Levofloxacin Susceptible      Nitrofurantoin Susceptible Susceptible     Piperacillin + Tazobactam Susceptible      Tetracycline Susceptible 3      Tobramycin Susceptible      Trimethoprim + Sulfamethoxazole Susceptible      Vancomycin Screening Plate  Susceptible      01/03/21  Escherichia coli       MIC SUSCEPTIBILITY RESULT     Ampicillin Resistant     Aztreonam Resistant     Cefazolin Resistant     Cefepime Resistant     Ceftazidime Intermediate     Ceftriaxone Resistant     Cephalexin Resistant 1     Ciprofloxacin Resistant     Ertapenem Susceptible     Gentamicin Susceptible     Levofloxacin Resistant     Meropenem Susceptible     Nitrofurantoin Susceptible     Tetracycline Resistant 2     Tobramycin Susceptible     Trimethoprim + Sulfamethoxazole Resistant          Imaging:   None

## 2021-05-28 NOTE — Unmapped (Addendum)
Please start taking fosfomycin 1 packet once weekly to try to prevent recurrent urinary tract infections.    We should only check for a urine infection if you are having symptoms like pain or burning with urination, increased frequency of urination, fever with those urinary symptoms. Smelly urine alone is not necessarily a sign of urine infection, but it can be when accompanied by those other symptoms mentioned above.    You could try over the counter cetirizine or Zyrtec for itching

## 2021-05-29 DIAGNOSIS — R7989 Other specified abnormal findings of blood chemistry: Principal | ICD-10-CM

## 2021-05-29 DIAGNOSIS — B64 Unspecified protozoal disease: Principal | ICD-10-CM

## 2021-05-29 MED ORDER — TORSEMIDE 20 MG TABLET
ORAL_TABLET | ORAL | 11 refills | 0 days
Start: 2021-05-29 — End: 2022-05-29

## 2021-05-29 MED FILL — VYNDAMAX 61 MG CAPSULE: ORAL | 30 days supply | Qty: 30 | Fill #4

## 2021-05-29 NOTE — Unmapped (Signed)
Patient would like a call about an up coming appointment I just made with another pcp. Per message sent from the nurses under the labs tab.  Please let me know if I can do anything to help you.  Thank you.

## 2021-05-29 NOTE — Unmapped (Signed)
Spoke with [name] re: elevated creatinine. Recommended decreasing torsemide dose from 40mg  to 20mg  daily, with 20mg  daily prn for weight gain or fluid overload. Also made PCP Dr. Brooke Dare aware - she will schedule follow up for Barbara Huber in clinic next week.

## 2021-05-29 NOTE — Unmapped (Signed)
PCP away from office today.  I spoke to the patient about elevation in her creatinine that was seen on labs obtained at ID clinic yesterday.    Creatinine 2.65, which is elevated above baseline.  She takes torsemide for heart failure and reports no increased swelling or shortness of breath.  She was advised to reduce her torsemide to 20 mg daily with an extra 20 mg as needed fluid overload.  She was given an appointment from our clinic on June 14.  She had questions about her creatinine and why this may have happened.  We discussed the idea of overdiuresis, which appears to have happened earlier on this year during the hospitalization at which time her kidney function was similar.    I do think that she would benefit from an earlier follow-up, so I will have the clinic reach out to her again for an appointment next week.

## 2021-06-01 DIAGNOSIS — I5032 Chronic diastolic (congestive) heart failure: Principal | ICD-10-CM

## 2021-06-02 DIAGNOSIS — E1122 Type 2 diabetes mellitus with diabetic chronic kidney disease: Principal | ICD-10-CM

## 2021-06-02 DIAGNOSIS — N179 Acute kidney failure, unspecified: Principal | ICD-10-CM

## 2021-06-02 DIAGNOSIS — I5032 Chronic diastolic (congestive) heart failure: Principal | ICD-10-CM

## 2021-06-02 DIAGNOSIS — N1832 Type 2 diabetes mellitus with stage 3b chronic kidney disease, without long-term current use of insulin (CMS-HCC): Principal | ICD-10-CM

## 2021-06-02 LAB — COMPREHENSIVE METABOLIC PANEL
ALBUMIN: 3.6 g/dL (ref 3.4–5.0)
ALKALINE PHOSPHATASE: 57 U/L (ref 46–116)
ALT (SGPT): 9 U/L — ABNORMAL LOW (ref 10–49)
ANION GAP: 5 mmol/L (ref 5–14)
AST (SGOT): 12 U/L (ref <=34)
BILIRUBIN TOTAL: 0.4 mg/dL (ref 0.3–1.2)
BLOOD UREA NITROGEN: 46 mg/dL — ABNORMAL HIGH (ref 9–23)
BUN / CREAT RATIO: 18
CALCIUM: 8.2 mg/dL — ABNORMAL LOW (ref 8.7–10.4)
CHLORIDE: 97 mmol/L — ABNORMAL LOW (ref 98–107)
CO2: 31.1 mmol/L — ABNORMAL HIGH (ref 20.0–31.0)
CREATININE: 2.57 mg/dL — ABNORMAL HIGH
EGFR CKD-EPI (2021) FEMALE: 19 mL/min/{1.73_m2} — ABNORMAL LOW (ref >=60)
GLUCOSE RANDOM: 133 mg/dL (ref 70–179)
POTASSIUM: 4.8 mmol/L (ref 3.4–4.8)
PROTEIN TOTAL: 7.3 g/dL (ref 5.7–8.2)
SODIUM: 133 mmol/L — ABNORMAL LOW (ref 135–145)

## 2021-06-02 LAB — CBC W/ AUTO DIFF
BASOPHILS ABSOLUTE COUNT: 0.1 10*9/L (ref 0.0–0.1)
BASOPHILS RELATIVE PERCENT: 1.4 %
EOSINOPHILS ABSOLUTE COUNT: 0.2 10*9/L (ref 0.0–0.5)
EOSINOPHILS RELATIVE PERCENT: 2.6 %
HEMATOCRIT: 23.1 % — ABNORMAL LOW (ref 34.0–44.0)
HEMOGLOBIN: 7 g/dL — ABNORMAL LOW (ref 11.3–14.9)
LYMPHOCYTES ABSOLUTE COUNT: 1 10*9/L — ABNORMAL LOW (ref 1.1–3.6)
LYMPHOCYTES RELATIVE PERCENT: 14 %
MEAN CORPUSCULAR HEMOGLOBIN CONC: 30.1 g/dL — ABNORMAL LOW (ref 32.0–36.0)
MEAN CORPUSCULAR HEMOGLOBIN: 23.5 pg — ABNORMAL LOW (ref 25.9–32.4)
MEAN CORPUSCULAR VOLUME: 78 fL (ref 77.6–95.7)
MEAN PLATELET VOLUME: 7.5 fL (ref 6.8–10.7)
MONOCYTES ABSOLUTE COUNT: 0.9 10*9/L — ABNORMAL HIGH (ref 0.3–0.8)
MONOCYTES RELATIVE PERCENT: 13.1 %
NEUTROPHILS ABSOLUTE COUNT: 4.7 10*9/L (ref 1.8–7.8)
NEUTROPHILS RELATIVE PERCENT: 68.9 %
NUCLEATED RED BLOOD CELLS: 0 /100{WBCs} (ref <=4)
PLATELET COUNT: 311 10*9/L (ref 150–450)
RED BLOOD CELL COUNT: 2.96 10*12/L — ABNORMAL LOW (ref 3.95–5.13)
RED CELL DISTRIBUTION WIDTH: 18.4 % — ABNORMAL HIGH (ref 12.2–15.2)
WBC ADJUSTED: 6.9 10*9/L (ref 3.6–11.2)

## 2021-06-02 LAB — BASIC METABOLIC PANEL
ANION GAP: 4 mmol/L — ABNORMAL LOW (ref 5–14)
BLOOD UREA NITROGEN: 46 mg/dL — ABNORMAL HIGH (ref 9–23)
BUN / CREAT RATIO: 18
CALCIUM: 8.6 mg/dL — ABNORMAL LOW (ref 8.7–10.4)
CHLORIDE: 95 mmol/L — ABNORMAL LOW (ref 98–107)
CO2: 30 mmol/L (ref 20.0–31.0)
CREATININE: 2.59 mg/dL — ABNORMAL HIGH
EGFR CKD-EPI (2021) FEMALE: 18 mL/min/{1.73_m2} — ABNORMAL LOW (ref >=60)
GLUCOSE RANDOM: 112 mg/dL (ref 70–179)
POTASSIUM: 4.7 mmol/L (ref 3.4–4.8)
SODIUM: 129 mmol/L — ABNORMAL LOW (ref 135–145)

## 2021-06-02 LAB — B-TYPE NATRIURETIC PEPTIDE
B-TYPE NATRIURETIC PEPTIDE: 462 pg/mL — ABNORMAL HIGH (ref <=100)
B-TYPE NATRIURETIC PEPTIDE: 479.48 pg/mL — ABNORMAL HIGH (ref <=100)

## 2021-06-02 LAB — HEMOGLOBIN A1C: HEMOGLOBIN A1C: <3.8 % — ABNORMAL LOW (ref 4.8–5.6)

## 2021-06-02 LAB — HIGH SENSITIVITY TROPONIN I - SINGLE: HIGH SENSITIVITY TROPONIN I: 50 ng/L (ref <=34)

## 2021-06-02 NOTE — Unmapped (Signed)
Bloodwork today  Continue torsemide at lower dose 20mg  daily, extra 20mg  for excess fluid

## 2021-06-02 NOTE — Unmapped (Addendum)
Assessment/Plan:     Lab Results   Component Value Date    A1C 5.5 04/04/2021    A1C 7.4 (H) 12/12/2020    A1C 5.7 (H) 05/30/2020       1. AKI - continue w/ reduced dose torsemide, obtain BMP, BNP today  2. Elevated BG - a1c today, consider re-initiation of Jardiance  3. HF - appears euvolemic on exam today, BNP today    **AKI still present, BNP elevated though stable on comparison.  A1c low though likely inaccurate 2/2 anemia.  Patient admitted as of 06/03/21    F/u: TBD    Subject/Objective:     Chief Complaint   Patient presents with   ??? Follow-up     S: 80 y.o. year old female with PMHx significant for   Past Medical History:   Diagnosis Date   ??? Acute kidney injury superimposed on chronic kidney disease (CMS-HCC) 10/11/2020   ??? Acute on chronic diastolic (congestive) heart failure (CMS-HCC) 08/23/2020   ??? Arthritis    ??? Calculus of kidney    ??? Calculus of ureter    ??? CHF (congestive heart failure) (CMS-HCC)    ??? Chronic atrial fibrillation (CMS-HCC) 07/20/2019   ??? COPD (chronic obstructive pulmonary disease) (CMS-HCC)    ??? Diabetes (CMS-HCC)    ??? Gangrenous cholecystitis 10/11/2020   ??? GERD (gastroesophageal reflux disease)    ??? Hydronephrosis    ??? Hypertension    ??? Hyponatremia 10/11/2020   ??? Intermediate coronary syndrome (CMS-HCC) 03/13/2014   ??? Lower extremity edema 09/28/2020   ??? Lumbar stenosis    ??? Microscopic hematuria    ??? Nausea alone    ??? Nephrolithiasis 04/17/2016   ??? Neuropathy    ??? Nocturia    ??? Other chronic cystitis    ??? Pulmonary hypertension (CMS-HCC)    ??? Renal colic    ??? Sleep apnea    ??? Unstable angina pectoris (CMS-HCC) 03/13/2014   .     Patient Active Problem List   Diagnosis   ??? Spinal stenosis of lumbar region   ??? Thoracic or lumbosacral neuritis or radiculitis   ??? Lumbosacral spondylosis   ??? COPD (chronic obstructive pulmonary disease) (CMS-HCC)   ??? Sleep apnea in adult   ??? Essential hypertension   ??? Type 2 diabetes mellitus with stage 3b chronic kidney disease, without long-term current use of insulin (CMS-HCC)   ??? Chronic cystitis   ??? Incomplete bladder emptying   ??? Nocturia   ??? Peripheral neuropathy   ??? Chronic respiratory failure with hypoxia (CMS-HCC)   ??? Anemia   ??? Persistent atrial fibrillation (CMS-HCC)   ??? Anemia in chronic kidney disease   ??? Chronic kidney disease, stage III (moderate) (CMS-HCC)   ??? Enrolled in chronic care management   ??? Pulmonary hypertension (CMS-HCC)   ??? Shortness of breath   ??? Chronic prescription opiate use   ??? Dyspnea on exertion   ??? 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 Cardiac amyloidosis (CMS-HCC)   ??? Recurrent cold sores       BG elevated in 170-200s for past 2-3 days, no change in eating habits.  Daughter reports [place] Pepper was d/cd in 1 or 2/22 2/2 recurrent UTI though was having UTI before and since Jardiance d/c.  Has felt poorly for past 4-5 days.  Had elevated SCr 2.65 on 05/28/21, torsemide was reduced from 40mg  daily to 20mg  daily, extra 20mg  instead of 40mg  for excess fluid.  Your Medication List          Accurate as of June 02, 2021  4:36 PM. If you have any questions, ask your nurse or doctor.            CONTINUE taking these medications    ACCU-CHEK AVIVA PLUS TEST STRP Strp  Generic drug: blood sugar diagnostic  USE TO CHECK BLOOD SUGAR 3 TIMES A DAY BEFORE MEALS     acetaminophen 500 MG tablet  Commonly known as: TYLENOL  Take 1,000 mg by mouth daily as needed for pain.     ADVAIR HFA 115-21 mcg/actuation inhaler  Generic drug: fluticasone propion-salmeteroL  Inhale 2 puffs Two (2) times a day.     albuterol 90 mcg/actuation inhaler  Commonly known as: PROVENTIL HFA;VENTOLIN HFA  Inhale 2 puffs every eight (8) hours as needed for wheezing.     amiodarone 200 MG tablet  Commonly known as: PACERONE  Take 1 tablet (200 mg total) by mouth daily.     apixaban 5 mg Tab  Commonly known as: ELIQUIS  Take 1 tablet (5 mg total) by mouth Two (2) times a day.     estradioL 0.01 % (0.1 mg/gram) vaginal cream  Commonly known as: ESTRACE  INSERT ONE GRAM VAGINALLY AT BEDTIME     fosfomycin 3 gram Pack  Commonly known as: MONUROL  Take 3 g by mouth once a week for 12 doses.     gabapentin 300 MG capsule  Commonly known as: NEURONTIN  Take 1 capsule (300 mg total) by mouth Three (3) times a day.     lancets Misc  1 each by Miscellaneous route daily. Accu Check     metFORMIN 500 MG tablet  Commonly known as: GLUCOPHAGE  Take 1 tablet (500 mg total) by mouth 2 (two) times a day with meals.     montelukast 10 mg tablet  Commonly known as: SINGULAIR  Take 1 tablet (10 mg total) by mouth daily.     NARCAN 4 mg/actuation nasal spray  Generic drug: naloxone  1 spray into alternating nostrils once as needed (opioid overdose).     oxyCODONE 10 mg Tr12 12 hr crush resistant ER/CR tablet  Commonly known as: OxyCONTIN  Take 10 mg by mouth every twelve (12) hours.     OXYGEN-AIR DELIVERY SYSTEMS MISC  5 L by Miscellaneous route.     rosuvastatin 5 MG tablet  Commonly known as: CRESTOR  Take 1 tablet (5 mg total) by mouth every other day.     SHINGRIX (PF) 50 mcg/0.5 mL Susr injection  Generic drug: varicella-zoster gE-AS01B (PF)  Inject 0.5 mL into the muscle.     spironolactone 50 MG tablet  Commonly known as: ALDACTONE  Take 1 tablet (50 mg total) by mouth daily.     tiotropium bromide 2.5 mcg/actuation inhalation mist  Commonly known as: SPIRIVA RESPIMAT  Inhale 2 puffs daily.     torsemide 20 MG tablet  Commonly known as: DEMADEX  Take 1 tablet (20 mg total) by mouth daily. May also take 1 tablet (20 mg total) daily as needed (weight gain).     VYNDAMAX 61 mg Cap  Generic drug: tafamidis  Take 1 capsule (61 mg) by mouth daily.            Allergies   Allergen Reactions   ??? Nitrofurantoin Anaphylaxis and Other (See Comments)   ??? Lipitor [Atorvastatin] Muscle Pain     SOB, Headache, fatigue, sick on my stomach  Medication adherence and barriers to the treatment plan have been addressed. Opportunities to optimize healthy behaviors have been discussed. Patient / caregiver voiced understanding.      ROS: negative for vision changes, CP, abdominal pain, bowel/bladder changes, bleeding, lower extremity edema    O:  Vital Signs:    Vitals:    06/02/21 1619   BP: 106/43   Pulse: 66   Temp: 36.9 ??C (98.4 ??F)   TempSrc: Temporal   SpO2: 90%   Weight: 96.2 kg (212 lb)   Height: 162.6 cm (5' 4)       Physical Exam  General Appearance:    Alert, cooperative, no distress, appears stated age   Head:    Normocephalic, without obvious abnormality, atraumatic   Eyes:     Ears:     Throat:   Lips, mucosa, and tongue normal   Neck:   Supple, symmetrical, trachea midline,      No JVD bilaterally.   Lungs:     Clear to auscultation bilaterally, respirations unlabored   Chest Wall:    No tenderness or deformity    Heart:    Regular rate and rhythm, S1 and S2 normal, no murmur, rub    or gallop   Abdomen:     Soft, non-tender       Extremities:   Extremities normal, atraumatic, no cyanosis or edema   Psych :  No agitation, anxiety, or depressed mood.

## 2021-06-02 NOTE — Unmapped (Signed)
[facility] at [facility]       Type of visit:  face to face    Reason for visit: follow up    Questions / Concerns that need to be addressed: has been feeling off since Saturday. BS ran high but feels kind of whoozy, fluid med got changed    General Consent to Treat (GCT) for non-epic video visits only:       Diabetes:  ??? Regularly checking blood sugars?: yes  o If yes, when? Complete log for past 7 days  Date Before Breakfast After Breakfast Before Lunch After Lunch Before Dinner After Dinner Before Bed                                                                                                                                     Hypertension:  ??? Have blood pressure cuff at home?: yes- arm cuff  ??? Regularly checking blood pressure?: yes  ??? If patient has a  record of recent blood pressures, please enter into flow sheets                Allergies reviewed: Yes    Medication reviewed: Yes  Pended refills? No        HCDM reviewed and updated in Epic:    We are working to make sure all of our patients??? wishes are updated in Epic and part of that is documenting a Environmental health practitioner for each patient  A Health Care Decision Maker is someone you choose who can make health care decisions for you if you are not able - who would you most want to do this for you????  is already up to date.        BPAs completed:  Falls Risk - adults 65+      COVID-19 Vaccine Summary  Which COVID-19 Vaccine was administered  Pfizer  Type:  Dates Given:  12/12/2020                   If no: Are you interested in scheduling?     Immunization History   Administered Date(s) Administered   ??? COVID-19 VACC,MRNA,(PFIZER)(PF)(IM) 01/19/2020, 02/09/2020, 12/12/2020   ??? Influenza Vaccine Quad (IIV4 PF) 44mo+ injectable 10/16/2015, 09/08/2019, 08/30/2020   ??? Influenza Virus Vaccine, unspecified formulation 09/27/2014, 09/23/2017, 09/21/2018   ??? Influenza, High Dose (IIV4) 65 yrs & older 09/28/2016   ??? PNEUMOCOCCAL POLYSACCHARIDE 23 09/28/2016   ??? Pneumococcal Conjugate 13-Valent 08/22/2019   ??? SHINGRIX-ZOSTER VACCINE (HZV), RECOMBINANT,SUB-UNIT,ADJUVANTED IM 04/04/2021       __________________________________________________________________________________________    SCREENINGS COMPLETED IN FLOWSHEETS    HARK Screening       AUDIT       PHQ2       PHQ9          P4 Suicidality Screener  GAD7       COPD Assessment       Falls Risk       .imcres

## 2021-06-03 ENCOUNTER — Ambulatory Visit: Admit: 2021-06-03 | Discharge: 2021-06-04 | Payer: MEDICARE

## 2021-06-03 ENCOUNTER — Encounter: Admit: 2021-06-03 | Discharge: 2021-06-04 | Payer: MEDICARE

## 2021-06-03 LAB — URINALYSIS WITH CULTURE REFLEX
BILIRUBIN UA: NEGATIVE
BLOOD UA: NEGATIVE
GLUCOSE UA: NEGATIVE
KETONES UA: NEGATIVE
NITRITE UA: NEGATIVE
PH UA: 7 (ref 5.0–9.0)
PROTEIN UA: NEGATIVE
RBC UA: <1 /HPF (ref <4)
SPECIFIC GRAVITY UA: 1.015 (ref 1.005–1.040)
SQUAMOUS EPITHELIAL: 7 /HPF — ABNORMAL HIGH (ref 0–5)
UROBILINOGEN UA: 0.2
WBC UA: 12 /HPF — ABNORMAL HIGH (ref 0–5)

## 2021-06-03 LAB — COMPREHENSIVE METABOLIC PANEL
ALBUMIN: 3.6 g/dL (ref 3.4–5.0)
ALKALINE PHOSPHATASE: 52 U/L (ref 46–116)
ALT (SGPT): 9 U/L — ABNORMAL LOW (ref 10–49)
ANION GAP: 8 mmol/L (ref 5–14)
AST (SGOT): 14 U/L (ref <=34)
BILIRUBIN TOTAL: 0.5 mg/dL (ref 0.3–1.2)
BLOOD UREA NITROGEN: 40 mg/dL — ABNORMAL HIGH (ref 9–23)
BUN / CREAT RATIO: 17
CALCIUM: 8.4 mg/dL — ABNORMAL LOW (ref 8.7–10.4)
CHLORIDE: 98 mmol/L (ref 98–107)
CO2: 27.9 mmol/L (ref 20.0–31.0)
CREATININE: 2.34 mg/dL — ABNORMAL HIGH
EGFR CKD-EPI (2021) FEMALE: 21 mL/min/{1.73_m2} — ABNORMAL LOW (ref >=60)
GLUCOSE RANDOM: 182 mg/dL — ABNORMAL HIGH (ref 70–179)
POTASSIUM: 4.4 mmol/L (ref 3.4–4.8)
PROTEIN TOTAL: 7.1 g/dL (ref 5.7–8.2)
SODIUM: 134 mmol/L — ABNORMAL LOW (ref 135–145)

## 2021-06-03 LAB — PHOSPHORUS: PHOSPHORUS: 4.7 mg/dL (ref 2.4–5.1)

## 2021-06-03 LAB — HIV ANTIGEN/ANTIBODY COMBO: HIV ANTIGEN/ANTIBODY COMBO: NONREACTIVE

## 2021-06-03 LAB — IRON PANEL
IRON: <20 ug/dL — ABNORMAL LOW
TOTAL IRON BINDING CAPACITY (CALC): 320 mg/dL
TRANSFERRIN: 254 mg/dL

## 2021-06-03 LAB — C-REACTIVE PROTEIN: C-REACTIVE PROTEIN: <4 mg/L (ref <=10.0)

## 2021-06-03 LAB — CBC
HEMATOCRIT: 25.7 % — ABNORMAL LOW (ref 34.0–44.0)
HEMOGLOBIN: 8.1 g/dL — ABNORMAL LOW (ref 11.3–14.9)
MEAN CORPUSCULAR HEMOGLOBIN CONC: 31.6 g/dL — ABNORMAL LOW (ref 32.0–36.0)
MEAN CORPUSCULAR HEMOGLOBIN: 24.9 pg — ABNORMAL LOW (ref 25.9–32.4)
MEAN CORPUSCULAR VOLUME: 78.9 fL (ref 77.6–95.7)
MEAN PLATELET VOLUME: 7.3 fL (ref 6.8–10.7)
PLATELET COUNT: 298 10*9/L (ref 150–450)
RED BLOOD CELL COUNT: 3.26 10*12/L — ABNORMAL LOW (ref 3.95–5.13)
RED CELL DISTRIBUTION WIDTH: 18.5 % — ABNORMAL HIGH (ref 12.2–15.2)
WBC ADJUSTED: 7.8 10*9/L (ref 3.6–11.2)

## 2021-06-03 LAB — LACTATE DEHYDROGENASE: LACTATE DEHYDROGENASE: 632 U/L — ABNORMAL HIGH (ref 120–246)

## 2021-06-03 LAB — D-DIMER, QUANTITATIVE: D-DIMER QUANTITATIVE (CW,ML,HL,HS,CH): <215 ng{FEU}/mL (ref <=500)

## 2021-06-03 LAB — FERRITIN: FERRITIN: 9.8 ng/mL

## 2021-06-03 LAB — MAGNESIUM: MAGNESIUM: 3.9 mg/dL — ABNORMAL HIGH (ref 1.6–2.6)

## 2021-06-03 MED ADMIN — sodium chloride (NS) 0.9 % infusion: 75 mL/h | INTRAVENOUS | @ 21:00:00 | Stop: 2021-06-04

## 2021-06-03 MED ADMIN — apixaban (ELIQUIS) tablet 5 mg: 5 mg | ORAL | @ 17:00:00

## 2021-06-03 MED ADMIN — montelukast (SINGULAIR) tablet 10 mg: 10.0 mg | ORAL | @ 17:00:00

## 2021-06-03 MED ADMIN — fluticasone furoate-vilanteroL (BREO ELLIPTA) 100-25 mcg/dose inhaler 1 puff: 1 | RESPIRATORY_TRACT | @ 13:00:00

## 2021-06-03 MED ADMIN — gabapentin (NEURONTIN) capsule 300 mg: 300 mg | ORAL | @ 17:00:00

## 2021-06-03 MED ADMIN — amiodarone (PACERONE) tablet 200 mg: 200 mg | ORAL | @ 13:00:00

## 2021-06-03 MED ADMIN — acetaminophen (TYLENOL) solution 650 mg: 650 mg | ORAL | @ 21:00:00

## 2021-06-03 MED ADMIN — oxyCODONE (OxyCONTIN) 12 hr crush resistant ER/CR tablet 10 mg: 10 mg | ORAL | @ 17:00:00 | Stop: 2021-06-17

## 2021-06-03 MED ADMIN — furosemide (LASIX) injection 80 mg: 80 mg | INTRAVENOUS | @ 06:00:00 | Stop: 2021-06-03

## 2021-06-03 MED ADMIN — remdesivir (VEKLURY) 200 mg in sodium chloride (NS) 0.9 % 315 mL IVPB: 200 mg | INTRAVENOUS | @ 13:00:00 | Stop: 2021-06-03

## 2021-06-03 NOTE — Unmapped (Signed)
[facility]  History and Physical    Assessment/Plan:    Principal Problem:    Persistent fatigue after COVID-19  Active Problems:    Spinal stenosis of lumbar region    COPD (chronic obstructive pulmonary disease) (CMS-HCC)    Essential hypertension    Type 2 diabetes mellitus with stage 3b chronic kidney disease, without long-term current use of insulin (CMS-HCC)    Chronic cystitis    Peripheral neuropathy    Chronic respiratory failure with hypoxia (CMS-HCC)    Anemia    Anemia in chronic kidney disease    Chronic kidney disease, stage III (moderate) (CMS-HCC)    Pulmonary hypertension (CMS-HCC)    Chronic prescription opiate use    Dyspnea on exertion    Cardiac amyloidosis (CMS-HCC)      Barbara Huber is a 80 y.o. 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      Dyspnea - Chronic Hypoxemic Respiratory Failure - Acute COVID-19:  Patient's shortness of breath is likely multifactorial.  She is at her baseline 3L supplemental oxygen.  The dyspnea has been most pronounced with activity.  She has no wheezing to suggest COPD exacerbation.  She does not appear clinically volume overloaded on my examination, if anything she appears somewhat dehydrated. BNP is elevated but close to baseline value.  She is currently positive for COVID-19 but unclear how acute this infection is - cycle time is quite elevated at 38.8 and she says she had viral symptoms during the first half of May but not recently.  CXR appears to be near baseline.  At risk for PE given chronic medical conditions and also recent COVID-19 infection, but D-dimer is quite reassuring <215 and she is on eliquis twice daily.   I think that dyspnea on exertion is likely related to patient's fatigue after COVID and also her worsening anemia (see below).  We will admit for close monitoring.  As I cannot definitively identify when her COVID-19 symptoms began I think it is reasonable to give a 3 day course of remdesivir.  That said I do not think she warrants treatment with steroids given her current symptoms (oxygen needs at baseline and no wheezing).         Date of symptom onset: Unknown, perhaps early May but could be more acute       Date of positive SARS-CoV-2 PCR: 06/02/21, cycle time 38.8       Exposure: Unknown, family is very careful - hospitalized end of April and only goes out for doctors appointments        Vaccination status:   COVID-19 Vaccine History     Name Date Dose Route    COVID-19 VACC,MRNA,(PFIZER)(PF)(IM) 12/12/2020  2:20 PM 0.3 mL Intramuscular    Site: Right deltoid    COVID-19 VACC,MRNA,(PFIZER)(PF)(IM) 02/09/2020 -- --    External: Patient reported    COVID-19 VACC,MRNA,(PFIZER)(PF)(IM) 01/19/2020 -- --    External: Patient reported              Therapy: Remdesivir 3 day course        Supplemental Oxygen: 3L (baseline) and actually turned down to 2L while I was in the room because SpO2 was 98%    - Wean oxygen to maintain SpO2 90-94% given COPD diagnosis and baseline oxygen  needs    - Admission labs: CBC w/ diff, CMP, CRP, troponin, ddimer, LDH, T&S, HIV ab  - Tylenol for fever   - Will monitor on telemetry given history of cardiac amyloid and atrial fibrillation   - Continuous pulse oximetry     Hypercoagulable state in the setting of COVID-19: Patient is at increased risk for thrombosis in the setting of recent COVID-19 infection. D-dimer is currently low and she is on therapeutic anticoagulation for atrial fibrillation.  Will continue.     Urinary Tract Infection:  Patient describes some lightheadedness, fatigue and does have recurrent urinary tract infections. She follows with [facility] and recently started on weekly fosfomycin to prevent infections although is still awaiting insurance clearance and has not yet taken a dose.  Unclear if her UA findings represent colonization or active infection but given her presenting symptoms we will go ahead and start treatment here.   - Fosfomycin x 1 dose   - Currently awaiting insurance approval but will take fosfomycin weekly going forward for prophylaxis per ID     Normocytic Anemia:  Hemoglobin was 9.3 as recently as April 2022 and is now 7.0  She denies any obvious signs of blood loss in her stool or elsewhere. Likely anemia of chronic disease with patient's ongoing [name] and multiple other chronic medical problems, perhaps exacerbated with recent viral illness.  Anemia is likely to be contributing to her lightheadedness and fatigue, although recent COVID-19 infection and resultant dehydration are likely driving this.  1 unit of blood ordered in the ED.  Will also order iron panel + ferritin.   - Follow up ferritin + iron panel  - Repeat Hgb after transfusion 1 unit   - Trend daily while inpatient, transfuse for Hgb < 7     Acute on Chronic Kidney Injury:  Baseline hemoglobin is 1.4 but for the past week has been elevated to approximately 2.6  Was instructed to hold her torsemide by her infectious disease provider.  On my examination she appears clinically dehydrated (dry mucous membranes, minimal edema) and this fits with history of poor PO intake of solids or fluids for the past month.  Infection could also be playing a role.  Will avoid giving fluids in large bolus as she has history of heart failure.  She received 1 unit pRBC and is taking PO.  Will hold diuretics.  Will monitor creatinine.  If it is not improving with antibiotics, blood and PO fluids she may need renal ultrasound.   - Daily creatinine  - PO fluids and 1 unit pRBC  - Hold torsemide and spironolactone     Troponinemia: Patient has chronic mild elevation of hsTroponin and this is quite consistent with those values.  Denies chest pain. This is likely due to poor clearance from [name].  Will monitor for signs and symptoms of chest pain but at this time I do not think this value needs to be trended.     Other Medical Problems:  Atrial Fibrillation: Currently in NSR. Continue home amiodarone and eliquis.   [name] - TTR Cardiac Amyloid:  Hold home torsemide and spironolactone. Continue daily tafamidis.   COPD: No current wheezing and on home O2 requirement of 3L or less. Continue home inhalers (spiriva and advair) with formulary substitutions. Albuterol PRN.   Type II DM: Hold home metformin while inpatient and also given elevated creatinine. Sliding scale insulin ordered.   Chronic Low Back Pain: Continue home gabapentin and oxycontin BID.   Peripheral Neuropathy:  Continue home gabapentin.     Advanced care planning  - Code status: Full code   - Healthcare proxy: Patient's daughter [name]     FEN/GI/PPX  - Diet: Regular   - PO fluids  - Bowel regimen: senna & miralax prn  - Incentive spirometry    Dispo: Admit for close monitoring. She is at her baseline O2 requirement.  Biggest issues has been fatigue, weakness, lightheadedness likely due to dehydration.  If she is feeling back to baseline anticipate she will be ready for discharge in the next couple of days. Will consult PT/OT to assess discharge needs.     > 50% of this encounter 75 minute encounter was spent on counseling and coordination of care involving COVID-19 infection and chronic medical conditions.   ___________________________________________________________________    Chief Complaint  Chief Complaint   Patient presents with   ??? Shortness of Breath       HPI:  Barbara Huber is a 80 y.o. 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      Patient reports that after being discharged at the end of April from med A service she has had a slow recovery.  Soon after discharge she became ill with cold like symptoms, predominately cough that was productive, fatigue and sore throat.  She also had loss of taste around this time and since then.  She does not believe she had a fever or chills.  The cough and sore throat recovered without complication but she has had progressive fatigue since that time and this week some dyspnea with exertion.  She admits that she has not been eating or drinking well because of poor appetite and loss of taste.  She has continued on all of her home medications including diuretics.  On day of presentation she was walking to her car and noticed dyspnea beyond her baseline when she got to the car and also some lightheadedness.  She also felt too weak to even lift her arms, although no focal neurologic deficits.  She did not have chest pain or tightness or palpitations.  No diaphoresis or nausea.  She decided she should be evaluated and presented to the emergency department.     In the ED she remained on her home oxygen requirement of 3L.  In fact, her SpO2 was 98-100% on this setting and so it was turned down to 2L to meet appropriate target oxygenation for chronic COPD (low 90s).  She denies dyspnea at rest or chest pain.  Just feeling fatigued and generally unwell.  She is afebrile.  Normal HR. Normal BP. Her WBC was in normal limits but notably Hgb had dropped since admission at the end of April from 9 to 7. Chemistry was notable for elevated creatinine from her baseline 1.4 up to 2.6   This had been noted by ID earlier in the week and she was told to hold her torsemide.  BNP was near her baseline ~ 500. hsTroponin elevated but also near her baseline ~30-50. She was COVID-19 positive on screening which was a surprise to her.  Cycle time of 38.8  CXR had subtle peripheral hazy opacities at the bases but not significanlty changed from prior CXR.      She was treated with IV lasix 80mg  for suspected heart failure  exacerbation prior to positive COVID-19 test.     Allergies:  Nitrofurantoin and Lipitor [atorvastatin]     Medications:   Prior to Admission medications    Medication Dose, Route, Frequency   ACCU-CHEK AVIVA PLUS TEST STRP Strp USE TO CHECK BLOOD SUGAR 3 TIMES A DAY BEFORE MEALS   acetaminophen (TYLENOL) 500 MG tablet 1,000 mg, Oral, Daily PRN   albuterol HFA 90 mcg/actuation inhaler 2 puffs, Inhalation, Every 8 hours PRN   amiodarone (PACERONE) 200 MG tablet 200 mg, Oral, Daily (standard)   apixaban (ELIQUIS) 5 mg Tab 5 mg, Oral, 2 times a day (standard)   estradioL (ESTRACE) 0.01 % (0.1 mg/gram) vaginal cream INSERT ONE GRAM VAGINALLY AT BEDTIME   fluticasone propion-salmeteroL (ADVAIR HFA) 115-21 mcg/actuation inhaler 2 puffs, Inhalation, 2 times a day (standard)   fosfomycin (MONUROL) 3 gram Pack 3 g, Oral, Weekly   gabapentin (NEURONTIN) 300 MG capsule 300 mg, Oral, 3 times a day (standard)   lancets Misc 1 each, Miscellaneous, Daily (standard), Accu Check   metFORMIN (GLUCOPHAGE) 500 MG tablet 500 mg, Oral, 2 times a day with meals   montelukast (SINGULAIR) 10 mg tablet 10 mg, Oral, Daily   NARCAN 4 mg/actuation nasal spray 1 spray, Alternating Nares, Once as needed   oxyCODONE (OXYCONTIN) 10 mg TR12 12 hr crush resistant ER/CR tablet 10 mg, Oral, Every 12 hours   OXYGEN-AIR DELIVERY SYSTEMS MISC 5 L, Miscellaneous   rosuvastatin (CRESTOR) 5 MG tablet 5 mg, Oral, Every other day   spironolactone (ALDACTONE) 50 MG tablet 50 mg, Oral, Daily (standard)   tafamidis 61 mg cap Take 1 capsule (61 mg) by mouth daily.   tiotropium bromide (SPIRIVA RESPIMAT) 2.5 mcg/actuation inhalation mist 2 puffs, Inhalation, Daily (standard)   torsemide (DEMADEX) 20 MG tablet Take 1 tablet (20 mg total) by mouth daily. May also take 1 tablet (20 mg total) daily as needed (weight gain).   varicella-zoster [id number], PF, (SHINGRIX, PF,) 50 mcg/0.5 mL SusR injection 0.5 mL, Intramuscular       Medical History:  Past Medical History:   Diagnosis Date   ??? Acute kidney injury superimposed on chronic kidney disease (CMS-HCC) 10/11/2020   ??? Acute on chronic diastolic (congestive) heart failure (CMS-HCC) 08/23/2020   ??? Arthritis    ??? Calculus of kidney    ??? Calculus of ureter    ??? CHF (congestive heart failure) (CMS-HCC)    ??? Chronic atrial fibrillation (CMS-HCC) 07/20/2019   ??? COPD (chronic obstructive pulmonary disease) (CMS-HCC)    ??? Diabetes (CMS-HCC)    ??? Gangrenous cholecystitis 10/11/2020   ??? GERD (gastroesophageal reflux disease)    ??? Hydronephrosis    ??? Hypertension    ??? Hyponatremia 10/11/2020   ??? Intermediate coronary syndrome (CMS-HCC) 03/13/2014   ??? Lower extremity edema 09/28/2020   ??? Lumbar stenosis    ??? Microscopic hematuria    ??? Nausea alone    ??? Nephrolithiasis 04/17/2016   ??? Neuropathy    ??? Nocturia    ??? Other chronic cystitis    ??? Pulmonary hypertension (CMS-HCC)    ??? Renal colic    ??? Sleep apnea    ??? Unstable angina pectoris (CMS-HCC) 03/13/2014       Surgical History:  Past Surgical History:   Procedure Laterality Date   ??? BACK SURGERY  1995   ??? CARPAL TUNNEL RELEASE Left 2014   ??? HYSTERECTOMY  1971   ??? IR INSERT CHOLECYSTOSMY TUBE PERCUTANEOUS  10/02/2020    IR INSERT  CHOLECYSTOSMY TUBE PERCUTANEOUS 10/02/2020 Braulio Conte, MD IMG VIR [facility]   ??? LUMBAR DISC SURGERY     ??? PR REMOVAL GALLBLADDER N/A 10/06/2020    Procedure: CHOLECYSTECTOMY;  Surgeon: Katherina Mires, MD;  Location: MAIN OR [facility];  Service: Trauma   ??? PR RIGHT HEART CATH O2 SATURATION & CARDIAC OUTPUT N/A 09/30/2020    Procedure: Right Heart Catheterization;  Surgeon: Neal Dy, MD;  Location: [facility] CATH;  Service: Cardiology   ??? PR RIGHT HEART CATH O2 SATURATION & CARDIAC OUTPUT N/A 01/09/2021    Procedure: Right Heart Catheterization;  Surgeon: Lesle Reek, MD;  Location: [facility] CATH;  Service: Cardiology       Social History:  Social History     Socioeconomic History   ??? Marital status: Widowed   Occupational History   ??? Occupation: retired Tobacco Use   ??? Smoking status: Former Smoker     Packs/day: 0.33     Types: Cigarettes   ??? Smokeless tobacco: Never Used   ??? Tobacco comment: Quit a few years ago   Vaping Use   ??? Vaping Use: Never used   Substance and Sexual Activity   ??? Alcohol use: No   ??? Drug use: No   Other Topics Concern   ??? Exercise No   Social History Narrative    Previously worked in Designer, fashion/clothing, retired.  Daughter is Economist who works at [employer].  Her niece is Selena Batten, [employer]est[employer] Tech.  Lives in [place] with daughter and son-in-law.     Social Determinants of Health     Financial Resource Strain: Low Risk    ??? Difficulty of Paying Living Expenses: Not very hard   Food Insecurity: No Food Insecurity   ??? Worried About Running Out of Food in the Last Year: Never true   ??? Ran Out of Food in the Last Year: Never true   Transportation Needs: No Transportation Needs   ??? Lack of Transportation (Medical): No   ??? Lack of Transportation (Non-Medical): No       Family History:  Family History   Problem Relation Age of Onset   ??? Hypertension Mother    ??? Cancer Father         COLON CANCER   ??? Anesthesia problems Neg Hx    ??? Broken bones Neg Hx    ??? Clotting disorder Neg Hx    ??? Collagen disease Neg Hx    ??? Diabetes Neg Hx    ??? Dislocations Neg Hx    ??? Fibromyalgia Neg Hx    ??? Gout Neg Hx    ??? Hemophilia Neg Hx    ??? Osteoporosis Neg Hx    ??? Rheumatologic disease Neg Hx    ??? Scoliosis Neg Hx    ??? Severe sprains Neg Hx    ??? Sickle cell anemia Neg Hx    ??? Spinal Compression Fracture Neg Hx    ??? GU problems Neg Hx    ??? Kidney cancer Neg Hx    ??? Prostate cancer Neg Hx        Review of Systems:  10 systems reviewed and are negative unless otherwise mentioned in HPI      Physical Exam:  Temp:  [36.3 ??C (97.3 ??F)-36.9 ??C (98.4 ??F)] 36.9 ??C (98.4 ??F)  Heart Rate:  [64-76] 64  SpO2 Pulse:  [65-74] 65  Resp:  [20-22] 20  BP: (95-136)/(43-87) 95/63  SpO2:  [85 %-100 %] 98 %  There is no height or weight on file to calculate BMI.    General: Chronically ill appearing,but alert, conversant, cooperative, siting up and in no distress.  Eyes: No scleral icterus.  ENT: Normal appearing nose. Oropharyngeal mucus membranes DRY   Cardiovascular: Normal rate, regular rhythm. No murmur. Normal JVP.  Extremities: Warm to touch. Regular 2+ radial pulses bilaterally. Trace pitting edema around ankles.   Respiratory: Normal respiratory effort on room air. Clear to auscultation bilaterally but diminished at bases.  No wheezing.   Gastrointestinal: Soft, non-tender, non-distended   Neurologic: Alert and fully oriented. Normal speech and language. Moving all extremities purposefully. No focal deficits.  Skin: Skin is warm, dry and intact. No rash.  Psychiatric: Mood and affect are normal. Speech and behavior are normal.      Test Results:  Data Review:  I have reviewed the labs and studies from the last 24 hours.    Imaging: Radiology studies were personally reviewed

## 2021-06-03 NOTE — Unmapped (Signed)
OCCUPATIONAL THERAPY  Evaluation (06/03/21 1330)    Patient Name:  Barbara Huber       Medical Record Number: [id number]   Date of Birth: 10/22/1941  Sex: Female          OT Treatment Diagnosis:  Decreased strength and endurance resulting in decreased (I) in self care and functional mobility    Assessment  Problem List: Decreased endurance, Impaired balance, Fall Risk, Impaired ADLs  Clinical Decision Making: Low    Assessment: Barbara Huber is a 80 y.o. female with a PMH of COPD, HTN, Afib, T2DM, stage 3 [name],  GERD, intermediate coronary syndrome, and CHF.        Patient presents to OT with worsening shortness of breath with associated lightheadedness and blurry vision pressure impacting pt's independence with ADLs and functional mobility/transfers compared to baseline. Patient educated on purpose and goal of OT.  Patient completing bed mobility with Mod I using bedrails this date. Pt exhibiting performance of self care tasks with supervision; Pt SpO2 at 93% post ADLs. Pt completing functional transfers with supervision using RW this date; pt reporting onset of blurry vision whenever she ambulates; no LOB this date during functional mobility. After review of the patient's occupational profile and history, assessment of occupational performance, clinical decision making, and development of POC, the patient presents as a low complexity case. Based on the daily activity AM-PAC raw score of 20/24, the patient is considered to be 38.32% impaired with self care. Recommend post acute 3x. DME: BSC    Today's Interventions: ADL retraining, Balance activities, Bed mobility, Endurance activities, Education - Family / caregiver, Education - Patient, Teacher, early years/pre, Safety education, Functional mobility    Today's Interventions: Role of OT and POC, self care, standing balance/tolerance, pt/family education, safety education, transfers, functional mobility    Activity Tolerance During Today's Session  Tolerated treatment well    Plan  Planned Frequency of Treatment:  1-2x per day for: 2-3x week       Planned Interventions:  ADL retraining, Balance activities, Bed mobility, Compensatory tech. training, Conservation, Merchant navy officer, Education - Patient, Education - Family / caregiver, Endurance activities, Functional mobility, UE Strength / coordination exercise, Transfer training, Therapeutic exercise, Safety education    Post-Discharge Occupational Therapy Recommendations:       OT DME Recommendations: Three in one commode -        GOALS:   Patient and Family Goals: None stated this date    IP Long Term Goal #1: Pt to reach maximum LOF within 4 weeks       Short Term:  Pt to complete full body dressing with Mod I using LRAD with no balance concerns   Time Frame : 2 weeks  Pt to complete toilet t/f, clothing management, toilet hygeine with Mod I using LRAD with no balance concerns   Time Frame : 2 weeks  Pt to complete +2 grooming tasks standing at sink with set-up using LRAD with no balance concerns   Time Frame : 2 weeks       Prognosis:     Positive Indicators:     Barriers to Discharge: None    Subjective  Current Status Pt received/left sitting up in bed, daughter at bedside, call bell within reach, RN aware  Prior Functional Status Pt reports being (I) in ADLs and mod I for IADLs with daughters assistance for cooking and house keeping. Pt uses a rollator at baseline. Pt is on 3L of O2 at home.    Medical  Tests / Procedures: reviewed in EPIC  Services patient receives: OT    Patient / Caregiver reports:  i didnt get any sleep last night    Past Medical History:   Diagnosis Date   ??? Acute kidney injury superimposed on chronic kidney disease (CMS-HCC) 10/11/2020   ??? Acute on chronic diastolic (congestive) heart failure (CMS-HCC) 08/23/2020   ??? Arthritis    ??? Calculus of kidney    ??? Calculus of ureter    ??? CHF (congestive heart failure) (CMS-HCC)    ??? Chronic atrial fibrillation (CMS-HCC) 07/20/2019   ??? COPD (chronic obstructive pulmonary disease) (CMS-HCC)    ??? Diabetes (CMS-HCC)    ??? Gangrenous cholecystitis 10/11/2020   ??? GERD (gastroesophageal reflux disease)    ??? Hydronephrosis    ??? Hypertension    ??? Hyponatremia 10/11/2020   ??? Intermediate coronary syndrome (CMS-HCC) 03/13/2014   ??? Lower extremity edema 09/28/2020   ??? Lumbar stenosis    ??? Microscopic hematuria    ??? Nausea alone    ??? Nephrolithiasis 04/17/2016   ??? Neuropathy    ??? Nocturia    ??? Other chronic cystitis    ??? Pulmonary hypertension (CMS-HCC)    ??? Renal colic    ??? Sleep apnea    ??? Unstable angina pectoris (CMS-HCC) 03/13/2014    Social History     Tobacco Use   ??? Smoking status: Former Smoker     Packs/day: 0.33     Types: Cigarettes   ??? Smokeless tobacco: Never Used   ??? Tobacco comment: Quit a few years ago   Substance Use Topics   ??? Alcohol use: No      Past Surgical History:   Procedure Laterality Date   ??? BACK SURGERY  1995   ??? CARPAL TUNNEL RELEASE Left 2014   ??? HYSTERECTOMY  1971   ??? IR INSERT CHOLECYSTOSMY TUBE PERCUTANEOUS  10/02/2020    IR INSERT CHOLECYSTOSMY TUBE PERCUTANEOUS 10/02/2020 Braulio Conte, MD IMG VIR H&V [facility]   ??? LUMBAR DISC SURGERY     ??? PR REMOVAL GALLBLADDER N/A 10/06/2020    Procedure: CHOLECYSTECTOMY;  Surgeon: Katherina Mires, MD;  Location: MAIN OR [facility];  Service: Trauma   ??? PR RIGHT HEART CATH O2 SATURATION & CARDIAC OUTPUT N/A 09/30/2020    Procedure: Right Heart Catheterization;  Surgeon: Neal Dy, MD;  Location: [facility] CATH;  Service: Cardiology   ??? PR RIGHT HEART CATH O2 SATURATION & CARDIAC OUTPUT N/A 01/09/2021    Procedure: Right Heart Catheterization;  Surgeon: Lesle Reek, MD;  Location: [facility] CATH;  Service: Cardiology    Family History   Problem Relation Age of Onset   ??? Hypertension Mother    ??? Cancer Father         COLON CANCER   ??? Anesthesia problems Neg Hx    ??? Broken bones Neg Hx    ??? Clotting disorder Neg Hx    ??? Collagen disease Neg Hx    ??? Diabetes Neg Hx    ??? Dislocations Neg Hx    ??? Fibromyalgia Neg Hx    ??? Gout Neg Hx    ??? Hemophilia Neg Hx    ??? Osteoporosis Neg Hx    ??? Rheumatologic disease Neg Hx    ??? Scoliosis Neg Hx    ??? Severe sprains Neg Hx    ??? Sickle cell anemia Neg Hx    ??? Spinal Compression Fracture Neg Hx    ??? GU problems Neg  Hx    ??? Kidney cancer Neg Hx    ??? Prostate cancer Neg Hx         Nitrofurantoin and Lipitor [atorvastatin]     Objective Findings  Precautions / Restrictions  Falls precautions, Isolation precautions (COVID-19)    Weight Bearing  Non-applicable    Required Braces or Orthoses  Non-applicable    Communication Preference  Verbal    Pain  No c/o pain reported    Equipment / Environment  Caregiver wearing mask for full session, Patient not wearing mask for full session, Vascular access (PIV, TLC, Port-a-cath, PICC) (OT wearing N95, gown, and eye protection)    Living Situation  Living Environment: House  Lives With: Daughter, Family (Lives with daughter and son-in-law; daughter and family able to assist PRN)  Home Living: One level home, Stairs to enter with rails, Walk-in shower, Grab bars in shower, Shower chair with back, Grab bars around toilet, Handicapped height toilet (Handicap accessible bathroom)  Rail placement (outside): Bilateral rails  Number of Stairs to Enter (outside): 2  Equipment available at home: Cane, Occupational hygienist, Oxygen     Cognition   Orientation Level:  Oriented x 4   Arousal/Alertness:  Appropriate responses to stimuli   Attention Span:  Appears intact   Memory:  Appears intact   Following Commands:  Follows all commands and directions without difficulty   Safety Judgment:  Good awareness of safety precautions   Awareness of Errors:  Good awareness of safety precautions   Problem Solving:  Able to problem solve independently   Comments:      Vision / Hearing   Vision: Wears glasses all the time  Vision Comments: Visual tracking: WFL; Peripheral vision: WFL  Hearing: No deficit identified         Hand Function:  Right Hand Function: Right hand grip strength, ROM and coordination WNL  Left Hand Function: Left hand grip strength, ROM and coordination WNL  Hand Dominance: Right    Skin Inspection:  Skin Inspection: Intact where visualized    ROM / Strength:  UE ROM/Strength: Left WFL, Right WFL  LE ROM/Strength: Left WFL, Right WFL    Coordination:  Coordination: WFL    Sensation:  RUE Sensation: RUE impaired  RUE Sensation Impairment: chronic peripheral neuropathy  LUE Sensation: LUE impaired  LUE Sensation Impairment: chronic peripheral neuropathy  RLE Sensation: RLE impaired  RLE Sensation Impairment: chronic peripheral neuropathy  LLE Sensation: LLE impaired  LLE Sensation Impairment: chronic peripheral neuropathy    Balance:  static/dynamic sitting: (I), static/dynamic standing: Supervision    Functional Mobility  Transfer Assistance Needed: No  Bed Mobility Assistance Needed: No  Ambulation: Pt ambulated ~10 ft x 2 to BSC<>EOB with supervision using RW; pt reporting blurry vision; no LOB noted      ADLs  ADLs: Supervision  IADLs: NT      Vitals / Orthostatics  At Rest: NAD  With Activity: Spo2: 93% (3L [place])  Orthostatics: Asymptomatic      Medical Staff Made Aware: RN stephanie      Occupational Therapy Session Duration  OT Individual [mins]: 38         I attest that I have reviewed the above information.  Signed: Brain Hilts, OT  Filed 06/03/2021

## 2021-06-03 NOTE — Unmapped (Signed)
PHYSICAL THERAPY  Evaluation (06/03/21 1501)          Patient Name:?? Barbara Huber????????   Medical Record Number: [id number]   Date of Birth: 12/31/1940  Sex: Female??  ??    Treatment Diagnosis: order is for discharge planning     Activity Tolerance: Limited by fatigue     ASSESSMENT  Problem List: Decreased endurance, Decreased mobility, Decreased strength      Assessment : Barbara Huber is a 80 y.o. 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  pt presents to PT with decreased endurance and strength impacting her functional mobility and ambulation skills who will benefit from PT intervention to address. Based on the AM-PAC 5 item raw score of 17/20, the patient is considered to be 35.81% impaired with basic mobility. This indicates pt will benefit from 3x/week at discharge to progress. After review of contributing co-morbitities and personal factors, clinical presentation and exam findings, patient demonstrates low complexity for evaluation and development of POC.         Today's Interventions: pt ed re PT POC, mobility, ambulation, ed on calling with OOB activity and progressing ambulation, eval AM-PAC 17/20                            PLAN  Planned Frequency of Treatment:?? 1-2x per day for: 3-4x week       Planned Interventions: Diaphragmatic / Pursed-lip breathing, Balance activities, Education - Patient, Education - Family / caregiver, Endurance activities, Functional mobility, Therapeutic activity, Therapeutic exercise, Artist Physical Therapy Recommendations:?? 3x weekly     PT DME Recommendations: None??????????       Goals:   Patient and Family Goals: feel better     Long Term Goal #1: 3 weeks PLOF        SHORT GOAL #1: pt will perform all transfers mod I, LRAD  ?????????????????????? Time Frame : 1 week  SHORT GOAL #2: pt will ambulate 100 ft mod I, LRAD  ?????????????????????? Time Frame : 1 week  SHORT GOAL #3: pt will negotiated 2 steps with 2 rails, SBA  ?????????????????????? Time Frame : 1 week     ??????????????????????       ??????????????????????       Prognosis:?? Good     Barriers to Discharge: Endurance deficits     SUBJECTIVE  Patient reports: I just feel so tired.  Current Functional Status: pt received and session concluded with pt in the bed, needs in reach, RN aware  Services patient receives: PT  Prior Functional Status: pt reports she is ambulatory with rollator, denies any falls, 3 L [place] baseline, has home pulse oxymeter  Equipment available at home: Crecencio Mc, Oxygen      Past Medical History:   Diagnosis Date   ??? Acute kidney injury superimposed on chronic kidney disease (CMS-HCC) 10/11/2020   ??? Acute on chronic diastolic (congestive) heart failure (CMS-HCC) 08/23/2020   ??? Arthritis    ??? Calculus of kidney    ??? Calculus of ureter    ??? CHF (congestive heart failure) (CMS-HCC)    ??? Chronic atrial fibrillation (CMS-HCC) 07/20/2019   ??? COPD (chronic obstructive pulmonary disease) (CMS-HCC)    ??? Diabetes (CMS-HCC)    ???  Gangrenous cholecystitis 10/11/2020   ??? GERD (gastroesophageal reflux disease)    ??? Hydronephrosis    ??? Hypertension    ??? Hyponatremia 10/11/2020   ??? Intermediate coronary syndrome (CMS-HCC) 03/13/2014   ??? Lower extremity edema 09/28/2020   ??? Lumbar stenosis    ??? Microscopic hematuria    ??? Nausea alone    ??? Nephrolithiasis 04/17/2016   ??? Neuropathy    ??? Nocturia    ??? Other chronic cystitis    ??? Pulmonary hypertension (CMS-HCC)    ??? Renal colic    ??? Sleep apnea    ??? Unstable angina pectoris (CMS-HCC) 03/13/2014            Social History     Tobacco Use   ??? Smoking status: Former Smoker     Packs/day: 0.33     Types: Cigarettes   ??? Smokeless tobacco: Never Used   ??? Tobacco comment: Quit a few years ago   Substance Use Topics   ??? Alcohol use: No       Past Surgical History:   Procedure Laterality Date   ??? BACK SURGERY  1995   ??? CARPAL TUNNEL RELEASE Left 2014   ??? HYSTERECTOMY  1971   ??? IR INSERT CHOLECYSTOSMY TUBE PERCUTANEOUS  10/02/2020    IR INSERT CHOLECYSTOSMY TUBE PERCUTANEOUS 10/02/2020 Braulio Conte, MD IMG VIR [facility]   ??? LUMBAR DISC SURGERY     ??? PR REMOVAL GALLBLADDER N/A 10/06/2020    Procedure: CHOLECYSTECTOMY;  Surgeon: Katherina Mires, MD;  Location: MAIN OR [facility];  Service: Trauma   ??? PR RIGHT HEART CATH O2 SATURATION & CARDIAC OUTPUT N/A 09/30/2020    Procedure: Right Heart Catheterization;  Surgeon: Neal Dy, MD;  Location: [facility] CATH;  Service: Cardiology   ??? PR RIGHT HEART CATH O2 SATURATION & CARDIAC OUTPUT N/A 01/09/2021    Procedure: Right Heart Catheterization;  Surgeon: Lesle Reek, MD;  Location: [facility] CATH;  Service: Cardiology             Family History   Problem Relation Age of Onset   ??? Hypertension Mother    ??? Cancer Father         COLON CANCER   ??? Anesthesia problems Neg Hx    ??? Broken bones Neg Hx    ??? Clotting disorder Neg Hx    ??? Collagen disease Neg Hx    ??? Diabetes Neg Hx    ??? Dislocations Neg Hx    ??? Fibromyalgia Neg Hx    ??? Gout Neg Hx    ??? Hemophilia Neg Hx    ??? Osteoporosis Neg Hx    ??? Rheumatologic disease Neg Hx    ??? Scoliosis Neg Hx    ??? Severe sprains Neg Hx    ??? Sickle cell anemia Neg Hx    ??? Spinal Compression Fracture Neg Hx    ??? GU problems Neg Hx    ??? Kidney cancer Neg Hx    ??? Prostate cancer Neg Hx         Allergies: Nitrofurantoin and Lipitor [atorvastatin]                  Objective Findings  Precautions / Restrictions  Precautions: Falls precautions, Isolation precautions  Weight Bearing Status: Non-applicable  Required Braces or Orthoses: Non-applicable     Communication Preference: Verbal          Pain Comments: no complaints of pain  Medical Tests / Procedures: reviewed  in EPIC  Equipment / Environment: Caregiver wearing mask for full session, Patient not wearing mask for full session, Vascular access (PIV, TLC, Port-a-cath, PICC), Supplemental oxygen     At Rest: VSS per EPIC  With Activity: SpO2 95% on 3 L [place]  Orthostatics: asymptomatic  Airway Clearance: mobility     Living Situation  Living Environment: House  Lives With: Daughter, Family  Home Living: One level home, Stairs to enter with rails, Walk-in shower, Grab bars in shower, Shower chair with back, Grab bars around toilet, Handicapped height toilet  Rail placement (outside): Bilateral rails  Number of Stairs to Enter (outside): 2      Cognition: WFL  Visual/Perception: Within Functional Limits     Skin Inspection: Intact where visualized     Upper Extremities  UE Strength: Right WFL, Left WFL    Lower Extremities  LE Strength: Right Impaired/Limited, Left Impaired/Limited  RLE Strength Impairment: Reduced strength  LLE Strength Impairment: Reduced strength     Coordination: Decreased speed  Coordination comment: sluggish movement  Sensation: WFL  Balance: Impaired dynamic standing balance  Balance comment: stands with RW,SBA      Bed Mobility: supine <> short sit mod I from semi-fowlers     Transfers: Sit to Stand  Sit to Stand assistance level: Contact guard assist, steadying assist  Transfer comments: RW, slow transition and decreased eccentric control with stand to sit      Gait Level of Assistance: Contact guard assist, steadying assist  Gait Assistive Device: Front wheel walker  Gait Distance Ambulated (ft): 30 ft  Gait: slow but steady                  Endurance: pt feeling fatigued at rest and with minimal OOB activity     Physical Therapy Session Duration  PT Individual [mins]: 25     Medical Staff Made Aware: RN Judeth Cornfield     I attest that I have reviewed the above information.  SignedDelbert Phenix, PT  Filed 06/03/2021

## 2021-06-03 NOTE — Unmapped (Signed)
ED Attending Note  [facility] Emergency Department   I saw the patient on 06/03/2021.      Patient Name: Barbara Huber  Chief Complaint: Shortness of Breath       ED Clinical Impression     Final diagnoses:   Acute on chronic congestive heart failure, unspecified heart failure type (CMS-HCC) (Primary)   Anemia, unspecified type   COVID-19           Impression, ED Course, Assessment and Plan   Medical Decision Making    Barbara Huber is a 80 y.o. female with a PMH of COPD, HTN, Afib, T2DM, stage 3 [name], ??GERD, intermediate coronary syndrome, and CHF presenting to the ED for evaluation of 1 day of worsening shortness of breath with associated lightheadedness and blurry vision. Patient also noting diffuse pruritus without rash and mild nausea. Otherwise patient mentions many ongoing issues including recurrent UTIs, elevated blood sugar, anemia, and slightly decreased blood pressure.     Patient is chronically ill appearing and in NAD. Patient is hemodynamically stable, afebrile, satting appropriately on 4L oxygen. On exam, patient with a normal work of breathing. Lungs clear to auscultation bilaterally. Heart sounds are normal. Abdominal exam is benign. On eye exam, PERRLA. EOMI. Visual fields are full. Patient has symmetric strength without drift. Normal sensation bilaterally.     Differential includes CHF exacerbation, anemia, hyperglycemia, viral infection, PNA, electrolyte abnormality. Less likely stroke given no focal neurologic deficits.  Suspect that global blurred vision may be secondary to report of hypoglycemia at home and she is not having symptoms suggestive of amaurosis, vertigo, ataxia or other symptoms suggestive of central process.    Plan for basic labs, UA, trop, BNP, Covid swab, Type and Screen, EKG, CXR, CT Head, and POCUS Bilateral Chest. Will give 1 unit of blood and treat with IV Lasix to avoid volume overload.  Is not having signs or symptoms of this time of active bleeding.  Troponin is minimally elevated, suspect this is secondary to volume overload and symptomatic anemia.    ED Course:   ED Course as of 06/03/21 0921   Tue Jun 03, 2021   0314 COVID returned positive which likely explains her symptoms as well. Admitted to hospitalist for further workup and care. On home O2 at this time.      Admitted to hospitalist for further work-up and management.     History   History obtained from: Patient     Barbara Huber is a 80 y.o. female with a PMH of COPD, HTN, Afib, T2DM, stage 3 [name], ??GERD, intermediate coronary syndrome, and CHF presenting to the ED for evaluation of shortness of breath. The patient reports 1 day of worsening shortness of breath with associated lightheadedness and generalized weakness The patient states that the shortness of breath and weakness is worse with exertion and she is now needing help ambulating. When she walks, notes some bilateral blurred vision but denies ataxia, dysequilibrium, diplopia, vision field deficits.. She has never had similar symptoms. She is currently on 3L of oxygen, but this is at baseline for her. Lastly, patient reports new onset of diffuse pruritus. Otherwise patient mentions many ongoing issues including recurrent UTIs, elevated blood sugar, anemia, and slightly decreased blood pressure. Patient reports recurrent UTIs and states that she is currently being followed by Infectious disease to determine if bacteriuria represents infection. She is not having symptoms of dysuria or hematuria. Was last re-evaluated last week where they found her creatine to be elevated and  suggested that she lower her dose of Torsemide due to this. Her last dose of antibiotics was over a month ago. She also states that her blood sugars have recently been elevated noting they have been between 170-214. She was also given iron supplement on her last visit suggesting chronic anemia. She denies fever, cough, vomiting, hematochezia. No other bleeding symptoms.     Past Medical/Past Surgical History:   Reviewed in EHR including nursing documentation as outlined. Pertinent PMH/PSH also noted above in HPI.   Past Medical History:   Diagnosis Date   ??? Acute kidney injury superimposed on chronic kidney disease (CMS-HCC) 10/11/2020   ??? Acute on chronic diastolic (congestive) heart failure (CMS-HCC) 08/23/2020   ??? Arthritis    ??? Calculus of kidney    ??? Calculus of ureter    ??? CHF (congestive heart failure) (CMS-HCC)    ??? Chronic atrial fibrillation (CMS-HCC) 07/20/2019   ??? COPD (chronic obstructive pulmonary disease) (CMS-HCC)    ??? Diabetes (CMS-HCC)    ??? Gangrenous cholecystitis 10/11/2020   ??? GERD (gastroesophageal reflux disease)    ??? Hydronephrosis    ??? Hypertension    ??? Hyponatremia 10/11/2020   ??? Intermediate coronary syndrome (CMS-HCC) 03/13/2014   ??? Lower extremity edema 09/28/2020   ??? Lumbar stenosis    ??? Microscopic hematuria    ??? Nausea alone    ??? Nephrolithiasis 04/17/2016   ??? Neuropathy    ??? Nocturia    ??? Other chronic cystitis    ??? Pulmonary hypertension (CMS-HCC)    ??? Renal colic    ??? Sleep apnea    ??? Unstable angina pectoris (CMS-HCC) 03/13/2014     Patient Active Problem List   Diagnosis   ??? Spinal stenosis of lumbar region   ??? Thoracic or lumbosacral neuritis or radiculitis   ??? Lumbosacral spondylosis   ??? COPD (chronic obstructive pulmonary disease) (CMS-HCC)   ??? Sleep apnea in adult   ??? Essential hypertension   ??? Type 2 diabetes mellitus with stage 3b chronic kidney disease, without long-term current use of insulin (CMS-HCC)   ??? Chronic cystitis   ??? Incomplete bladder emptying   ??? Nocturia   ??? Peripheral neuropathy   ??? Chronic respiratory failure with hypoxia (CMS-HCC)   ??? Anemia   ??? Persistent atrial fibrillation (CMS-HCC)   ??? Anemia in chronic kidney disease   ??? Chronic kidney disease, stage III (moderate) (CMS-HCC)   ??? Enrolled in chronic care management   ??? Pulmonary hypertension (CMS-HCC)   ??? Shortness of breath   ??? Chronic prescription opiate use   ??? Dyspnea on exertion   ??? 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 Cardiac amyloidosis (CMS-HCC)   ??? Recurrent cold sores   ??? Persistent fatigue after COVID-19     Past Surgical History:   Procedure Laterality Date   ??? BACK SURGERY  1995   ??? CARPAL TUNNEL RELEASE Left 2014   ??? HYSTERECTOMY  1971   ??? IR INSERT CHOLECYSTOSMY TUBE PERCUTANEOUS  10/02/2020    IR INSERT CHOLECYSTOSMY TUBE PERCUTANEOUS 10/02/2020 Braulio Conte, MD IMG VIR [facility]   ??? LUMBAR DISC SURGERY     ??? PR REMOVAL GALLBLADDER N/A 10/06/2020    Procedure: CHOLECYSTECTOMY;  Surgeon: Katherina Mires, MD;  Location: MAIN OR [facility];  Service: Trauma   ??? PR RIGHT HEART CATH O2 SATURATION & CARDIAC OUTPUT N/A 09/30/2020    Procedure: Right Heart Catheterization;  Surgeon: Neal Dy, MD;  Location: [facility] CATH;  Service: Cardiology   ??? PR RIGHT HEART CATH O2 SATURATION & CARDIAC OUTPUT N/A 01/09/2021    Procedure: Right Heart Catheterization;  Surgeon: Lesle Reek, MD;  Location: [facility] CATH;  Service: Cardiology     Social History/Family History:   Reviewed in EMR, I agree with nursing documentation. Additional pertinent social and family history noted in HPI.   Social History     Tobacco Use   ??? Smoking status: Former Smoker     Packs/day: 0.33     Types: Cigarettes   ??? Smokeless tobacco: Never Used   ??? Tobacco comment: Quit a few years ago   Vaping Use   ??? Vaping Use: Never used   Substance Use Topics   ??? Alcohol use: No   ??? Drug use: No     Family History   Problem Relation Age of Onset   ??? Hypertension Mother    ??? Cancer Father         COLON CANCER   ??? Anesthesia problems Neg Hx    ??? Broken bones Neg Hx    ??? Clotting disorder Neg Hx    ??? Collagen disease Neg Hx    ??? Diabetes Neg Hx    ??? Dislocations Neg Hx    ??? Fibromyalgia Neg Hx    ??? Gout Neg Hx    ??? Hemophilia Neg Hx    ??? Osteoporosis Neg Hx    ??? Rheumatologic disease Neg Hx    ??? Scoliosis Neg Hx    ??? Severe sprains Neg Hx    ??? Sickle cell anemia Neg Hx ??? Spinal Compression Fracture Neg Hx    ??? GU problems Neg Hx    ??? Kidney cancer Neg Hx    ??? Prostate cancer Neg Hx        ROS  A review of systems reviewed and are negative except as stated in the HPI or noted below.     Constitutional; Neg for fevers   Eyes: Pos for vision changes. Pos for blurry vision.   Ears, nose, mouth, throat: Neg for sore throat  Cardiovascular: Neg for chest pain.   Respiratory: Neg for cough. Pos for shortness of breath.   Gastrointestinal: Neg for abdominal pain. Neg for nausea or vomiting.   Genitourinary: Neg for dysuria. Neg for hematuria  Musculoskeletal: Neg for joint pain. Neg for swelling   Skin: Pos for pruritus. Neg for rashes  Neurological: Pos for dizziness. Neg for focal numbness or weakness    Home Medications:    Current Facility-Administered Medications:   ???  acetaminophen (TYLENOL) solution 650 mg, 650 mg, Oral, Q4H PRN, Nikki Dom, MD  ???  albuterol 2.5 mg /3 mL (0.083 %) nebulizer solution 2.5 mg, 2.5 mg, Nebulization, Q4H PRN, Nikki Dom, MD  ???  amiodarone (PACERONE) tablet 200 mg, 200 mg, Oral, Daily, Nikki Dom, MD, 200 mg at 06/03/21 1610  ???  dextrose 50 % in water (D50W) 50 % solution 12.5 g, 12.5 g, Intravenous, Q10 Min PRN, Nikki Dom, MD  ???  fluticasone furoate-vilanteroL (BREO ELLIPTA) 100-25 mcg/dose inhaler 1 puff, 1 puff, Inhalation, Daily (RT), Nikki Dom, MD, 1 puff at 06/03/21 0902  ???  insulin lispro (HumaLOG) injection 0-12 Units, 0-12 Units, Subcutaneous, ACHS, Nikki Dom, MD  ???  remdesivir [facility]) 200 mg in sodium chloride (NS) 0.9 % 315 mL IVPB, 200 mg, Intravenous, Once, Last Rate: 630 mL/hr at 06/03/21 0857, 200 mg  at 06/03/21 0857 **FOLLOWED BY** [START ON 06/04/2021] remdesivir (VEKLURY) 100 mg in sodium chloride (NS) 0.9 % 295 mL IVPB, 100 mg, Intravenous, Daily, Nikki Dom, MD    Current Outpatient Medications:   ???  ACCU-CHEK AVIVA PLUS TEST STRP Strp, USE TO CHECK BLOOD SUGAR 3 TIMES A DAY BEFORE MEALS, Disp: , Rfl:   ???  acetaminophen (TYLENOL) 500 MG tablet, Take 1,000 mg by mouth daily as needed for pain. , Disp: , Rfl:   ???  albuterol HFA 90 mcg/actuation inhaler, Inhale 2 puffs every eight (8) hours as needed for wheezing., Disp: , Rfl:   ???  amiodarone (PACERONE) 200 MG tablet, Take 1 tablet (200 mg total) by mouth daily., Disp: 30 tablet, Rfl: 1  ???  apixaban (ELIQUIS) 5 mg Tab, Take 1 tablet (5 mg total) by mouth Two (2) times a day., Disp: 60 tablet, Rfl: 11  ???  estradioL (ESTRACE) 0.01 % (0.1 mg/gram) vaginal cream, INSERT ONE GRAM VAGINALLY AT BEDTIME, Disp: , Rfl:   ???  fluticasone propion-salmeteroL (ADVAIR HFA) 115-21 mcg/actuation inhaler, Inhale 2 puffs Two (2) times a day., Disp: 12 g, Rfl: 11  ???  fosfomycin (MONUROL) 3 gram Pack, Take 3 g by mouth once a week for 12 doses., Disp: 36 g, Rfl: 1  ???  gabapentin (NEURONTIN) 300 MG capsule, Take 1 capsule (300 mg total) by mouth Three (3) times a day., Disp: 90 capsule, Rfl: 0  ???  lancets Misc, 1 each by Miscellaneous route daily. Accu Check, Disp: 100 each, Rfl: 6  ???  metFORMIN (GLUCOPHAGE) 500 MG tablet, Take 1 tablet (500 mg total) by mouth 2 (two) times a day with meals., Disp: 180 tablet, Rfl: 3  ???  montelukast (SINGULAIR) 10 mg tablet, Take 1 tablet (10 mg total) by mouth daily., Disp: 90 tablet, Rfl: 3  ???  NARCAN 4 mg/actuation nasal spray, 1 spray into alternating nostrils once as needed (opioid overdose)., Disp: , Rfl:   ???  oxyCODONE (OXYCONTIN) 10 mg TR12 12 hr crush resistant ER/CR tablet, Take 10 mg by mouth every twelve (12) hours., Disp: , Rfl:   ???  OXYGEN-AIR DELIVERY SYSTEMS MISC, 5 L by Miscellaneous route., Disp: , Rfl:   ???  rosuvastatin (CRESTOR) 5 MG tablet, Take 1 tablet (5 mg total) by mouth every other day., Disp: 3015 tablet, Rfl: 11  ???  spironolactone (ALDACTONE) 50 MG tablet, Take 1 tablet (50 mg total) by mouth daily., Disp: 90 tablet, Rfl: 3  ???  tafamidis 61 mg cap, Take 1 capsule (61 mg) by mouth daily., Disp: 30 capsule, Rfl: 11  ???  tiotropium bromide (SPIRIVA RESPIMAT) 2.5 mcg/actuation inhalation mist, Inhale 2 puffs daily., Disp: 4 g, Rfl: 11  ???  torsemide (DEMADEX) 20 MG tablet, Take 1 tablet (20 mg total) by mouth daily. May also take 1 tablet (20 mg total) daily as needed (weight gain)., Disp: 60 tablet, Rfl: 11  ???  varicella-zoster [id number]-[id number], PF, (SHINGRIX, PF,) 50 mcg/0.5 mL SusR injection, Inject 0.5 mL into the muscle., Disp: 0.5 mL, Rfl: 1    ALLERGIES:   Nitrofurantoin and Lipitor [atorvastatin]       Physical Exam     ED Triage Vitals [06/02/21 2155]   Enc Vitals Group      BP 136/54      Heart Rate 76      SpO2 Pulse       Resp 22      Temp 36.3 ??C (97.3 ??F)  Temp Source Oral      SpO2 96 %     Vital signs reviewed.  GENERAL: well-appearing in no acute distress  HEENT: airway is patent, moist mucus membranes, oropharynx clear without lesions, exudates, thrush  EYES: pupils equal and reactive to light bilaterally, no scleral icterus, conjunctivae clear. EOMI. Visual fields are full.   NECK: trachea midline, no cervical lymphadenopathy  CARDIAC: normal rate, regular rhythm, normal S1 and S2, no murmurs, 2+ peripheral pulses, normal capillary refill  PULMONARY: normal work of breathing on 4L of oxygen, good air movement bilaterally, lungs are clear to auscultation bilaterally without wheezes, crackles, rhonchi  ABDOMINAL: normal bowel sounds, abdomen soft, nontender, nondistended, no hepatomegaly or splenomegaly  MSK: no joint deformity or swelling, moves all 4 extremities without difficulty, trace LE pitting edema   NEURO: alert and oriented ??3, normal mental status, PERRL, EOMI, 5/5 strength in all 4 extremities with no drift, no focal sensory deficits to light touch in distal extremities  SKIN: Warm and dry without rashes  PSYCH: Mood and affect are normal      Labs and Radiology     Labs Reviewed   COVID-19 PCR - Abnormal; Notable for the following components:       Result Value    SARS-CoV-2 PCR Positive (*)     All other components within normal limits    Narrative:     --  This test was performed using the Cepheid Xpert Xpress SARS-CoV-2 assay which has been validated by the CLIA-certified, CAP-inspected [facility] and [facility]. FDA has granted Emergency Use Authorization for this test.   This real-time RT-PCR test detects SARS-CoV-2 by targeting the N2 and E genes. Negative results do not preclude SARS-CoV-2 infection and should not be used as the sole basis for patient management decisions. Negative results must be combined with clinical observations, patient history, and epidemiological information.   Information for providers and patients can be found here: [email or web address]   COMPREHENSIVE METABOLIC PANEL - Abnormal; Notable for the following components:    Sodium 133 (*)     Chloride 97 (*)     CO2 31.1 (*)     BUN 46 (*)     Creatinine 2.57 (*)     eGFR [name]-EPI (2021) Female 19 (*)     Calcium 8.2 (*)     ALT 9 (*)     All other components within normal limits   HIGH SENSITIVITY TROPONIN I - SINGLE - Abnormal; Notable for the following components:    hsTroponin I 50 (*)     All other components within normal limits   B-TYPE NATRIURETIC PEPTIDE - Abnormal; Notable for the following components:    BNP 479.48 (*)     All other components within normal limits   URINALYSIS WITH CULTURE REFLEX - Abnormal; Notable for the following components:    Leukocyte Esterase, UA Moderate (*)     WBC, UA 12 (*)     Squam Epithel, UA 7 (*)     Bacteria, UA Moderate (*)     All other components within normal limits   LACTATE DEHYDROGENASE - Abnormal; Notable for the following components:    LDH 632 (*)     All other components within normal limits   IRON PANEL - Abnormal; Notable for the following components:    Iron <20 (*)     All other components within normal limits   CBC W/ AUTO DIFF - Abnormal;  Notable for the following components:    RBC 2.96 (*)     HGB 7.0 (*)     HCT 23.1 (*)     MCH 23.5 (*)     MCHC 30.1 (*)     RDW 18.4 (*)     Absolute Lymphocytes 1.0 (*)     Absolute Monocytes 0.9 (*)     Anisocytosis Slight (*)     All other components within normal limits   C-REACTIVE PROTEIN - Normal   FERRITIN - Normal   D-DIMER, QUANTITATIVE - Normal    Narrative:     When used in conjunction with a clinical Pre-Test Probability assessment to exclude the venous thromboembolism (VTE) in outpatients suspected of deep vein thrombosis (DVT) and/or pulmonary embolism (PE), a cut-off of <500 ng/mL FEUs is recommended.     On 06-27-2020, [facility] converted D-Dimer testing from DDU (D-dimer Units) to FEUs (Fibrinogen Equivalence Units). Results reported prior to 0800 on 06-27-2020 were reported using DDUs which lowered the reported value in ng/mL by 2-3 fold.       POCT GLUCOSE, INTERFACED - Normal   URINE CULTURE   CBC W/ DIFFERENTIAL    Narrative:     The following orders were created for panel order CBC w/ Differential.  Procedure                               Abnormality         Status                     ---------                               -----------         ------                     CBC w/ Differential[[id number]]         Abnormal            Final result                 Please view results for these tests on the individual orders.   HIV ANTIGEN/ANTIBODY COMBO   CBC   TYPE AND SCREEN   PREPARE RBC   ABO/RH   PREPARE RBC     CT head WO contrast   Final Result   No acute intracranial abnormality.      XR Chest 1 view Portable   Final Result      Mild pulmonary edema without focal consolidation.      ====================   ATTENDING ADDENDUM by Dr.Muthu Royanne Foots MD(06/03/2021 6:13 AM):    On review, the following additional findings were noted:      Peripheral predominant hazy airspace opacities bilaterally, may represent infection versus edema.      ED POCUS Chest Bilateral    (Results Pending)      EKG   Sinus showing a rate of 66 bpm.  Normal PR, QRS, QTc interval.  There is low voltage, likely due to known cardiac amyloidosis.  No ST segment elevation, ST depression, T wave inversion suggestive of acute ischemia.      Procedures   Procedures     ED Treatment   Medications given in the ED:   Medications   amiodarone (PACERONE) tablet 200 mg (  200 mg Oral Given 06/03/21 0905)   fluticasone furoate-vilanteroL (BREO ELLIPTA) 100-25 mcg/dose inhaler 1 puff (1 puff Inhalation Given 06/03/21 0902)   remdesivir (VEKLURY) 200 mg in sodium chloride (NS) 0.9 % 315 mL IVPB (200 mg Intravenous New Bag 06/03/21 0857)     Followed by   remdesivir Elias Else) 100 mg in sodium chloride (NS) 0.9 % 295 mL IVPB (has no administration in time range)   acetaminophen (TYLENOL) solution 650 mg (has no administration in time range)   insulin lispro (HumaLOG) injection 0-12 Units (0 Units Subcutaneous Not Given 06/03/21 0807)   dextrose 50 % in water (D50W) 50 % solution 12.5 g (has no administration in time range)   albuterol 2.5 mg /3 mL (0.083 %) nebulizer solution 2.5 mg (has no administration in time range)   furosemide (LASIX) injection 80 mg (80 mg Intravenous Given 06/03/21 0206)           Portions of this record have been created using Dragon dictation software. Dictation errors have been sought, but may not have been identified and corrected.     Documentation assistance was provided by Dallas Schimke, Scribe on June 03, 2021 at 1:46 AM for Army Melia, MD.     Documentation assistance was provided by the scribe in my presence.  The documentation recorded by the scribe has been reviewed by me and accurately reflects the services I personally performed.     Bryson Ha, MD  06/03/21 [phone]

## 2021-06-03 NOTE — Unmapped (Signed)
Pt arrives with c/o sob and dizziness with onset today.  Pt on 3L oxygen at baseline.

## 2021-06-04 ENCOUNTER — Telehealth (HOSPITAL_COMMUNITY): Payer: Self-pay | Admitting: *Deleted

## 2021-06-04 LAB — COMPREHENSIVE METABOLIC PANEL
ALBUMIN: 3.5 g/dL (ref 3.4–5.0)
ALKALINE PHOSPHATASE: 51 U/L (ref 46–116)
ALT (SGPT): <7 U/L — ABNORMAL LOW (ref 10–49)
ANION GAP: 6 mmol/L (ref 5–14)
AST (SGOT): 10 U/L (ref <=34)
BILIRUBIN TOTAL: 0.5 mg/dL (ref 0.3–1.2)
BLOOD UREA NITROGEN: 40 mg/dL — ABNORMAL HIGH (ref 9–23)
BUN / CREAT RATIO: 19
CALCIUM: 8.1 mg/dL — ABNORMAL LOW (ref 8.7–10.4)
CHLORIDE: 102 mmol/L (ref 98–107)
CO2: 28.9 mmol/L (ref 20.0–31.0)
CREATININE: 2.07 mg/dL — ABNORMAL HIGH
EGFR CKD-EPI (2021) FEMALE: 24 mL/min/{1.73_m2} — ABNORMAL LOW (ref >=60)
GLUCOSE RANDOM: 108 mg/dL (ref 70–179)
POTASSIUM: 5.2 mmol/L — ABNORMAL HIGH (ref 3.4–4.8)
PROTEIN TOTAL: 6.9 g/dL (ref 5.7–8.2)
SODIUM: 137 mmol/L (ref 135–145)

## 2021-06-04 LAB — CBC
HEMATOCRIT: 24.2 % — ABNORMAL LOW (ref 34.0–44.0)
HEMOGLOBIN: 7.6 g/dL — ABNORMAL LOW (ref 11.3–14.9)
MEAN CORPUSCULAR HEMOGLOBIN CONC: 31.2 g/dL — ABNORMAL LOW (ref 32.0–36.0)
MEAN CORPUSCULAR HEMOGLOBIN: 24.5 pg — ABNORMAL LOW (ref 25.9–32.4)
MEAN CORPUSCULAR VOLUME: 78.6 fL (ref 77.6–95.7)
MEAN PLATELET VOLUME: 7.7 fL (ref 6.8–10.7)
PLATELET COUNT: 287 10*9/L (ref 150–450)
RED BLOOD CELL COUNT: 3.08 10*12/L — ABNORMAL LOW (ref 3.95–5.13)
RED CELL DISTRIBUTION WIDTH: 19 % — ABNORMAL HIGH (ref 12.2–15.2)
WBC ADJUSTED: 6.9 10*9/L (ref 3.6–11.2)

## 2021-06-04 LAB — PHOSPHORUS: PHOSPHORUS: 5.3 mg/dL — ABNORMAL HIGH (ref 2.4–5.1)

## 2021-06-04 LAB — MAGNESIUM: MAGNESIUM: 3.6 mg/dL — ABNORMAL HIGH (ref 1.6–2.6)

## 2021-06-04 MED ORDER — GABAPENTIN 300 MG CAPSULE
ORAL_CAPSULE | Freq: Three times a day (TID) | ORAL | 0 refills | 30 days
Start: 2021-06-04 — End: 2021-07-04

## 2021-06-04 MED ORDER — FERROUS SULFATE 325 MG (65 MG IRON) TABLET
ORAL_TABLET | ORAL | 3 refills | 60 days
Start: 2021-06-04 — End: 2022-06-04

## 2021-06-04 MED ADMIN — conjugated estrogens (PREMARIN) vaginal cream 1 g: 1 g | VAGINAL | @ 02:00:00

## 2021-06-04 MED ADMIN — gabapentin (NEURONTIN) capsule 300 mg: 300 mg | ORAL | @ 02:00:00

## 2021-06-04 MED ADMIN — remdesivir (VEKLURY) 100 mg in sodium chloride (NS) 0.9 % 295 mL IVPB: 100 mg | INTRAVENOUS | @ 14:00:00 | Stop: 2021-06-04

## 2021-06-04 MED ADMIN — umeclidinium (INCRUSE ELLIPTA) 62.5 mcg/actuation inhaler 1 puff: 1 | RESPIRATORY_TRACT | @ 13:00:00 | Stop: 2021-06-04

## 2021-06-04 MED ADMIN — montelukast (SINGULAIR) tablet 10 mg: 10.0 mg | ORAL | @ 14:00:00 | Stop: 2021-06-04

## 2021-06-04 MED ADMIN — oxyCODONE (OxyCONTIN) 12 hr crush resistant ER/CR tablet 10 mg: 10 mg | ORAL | @ 02:00:00 | Stop: 2021-06-17

## 2021-06-04 MED ADMIN — insulin lispro (HumaLOG) injection 0-12 Units: 0-12 [IU] | SUBCUTANEOUS | @ 02:00:00

## 2021-06-04 MED ADMIN — amiodarone (PACERONE) tablet 200 mg: 200 mg | ORAL | @ 14:00:00 | Stop: 2021-06-04

## 2021-06-04 MED ADMIN — oxyCODONE (OxyCONTIN) 12 hr crush resistant ER/CR tablet 10 mg: 10 mg | ORAL | @ 17:00:00 | Stop: 2021-06-04

## 2021-06-04 MED ADMIN — apixaban (ELIQUIS) tablet 5 mg: 5 mg | ORAL | @ 14:00:00 | Stop: 2021-06-04

## 2021-06-04 MED ADMIN — gabapentin (NEURONTIN) capsule 300 mg: 300 mg | ORAL | @ 10:00:00 | Stop: 2021-06-04

## 2021-06-04 MED ADMIN — apixaban (ELIQUIS) tablet 5 mg: 5 mg | ORAL | @ 02:00:00

## 2021-06-04 MED ADMIN — gabapentin (NEURONTIN) capsule 300 mg: 300 mg | ORAL | @ 17:00:00 | Stop: 2021-06-04

## 2021-06-04 MED ADMIN — insulin lispro (HumaLOG) injection 0-12 Units: 0-12 [IU] | SUBCUTANEOUS | @ 17:00:00 | Stop: 2021-06-04

## 2021-06-04 NOTE — Telephone Encounter (Signed)
Opened in error

## 2021-06-04 NOTE — Unmapped (Signed)
Problem: Adult Inpatient Plan of Care  Goal: Plan of Care Review  Outcome: Resolved  Goal: Patient-Specific Goal (Individualized)  Outcome: Resolved  Goal: Absence of Hospital-Acquired Illness or Injury  Outcome: Resolved  Goal: Optimal Comfort and Wellbeing  Outcome: Resolved  Goal: Readiness for Transition of Care  Outcome: Resolved  Goal: Rounds/Family Conference  Outcome: Resolved     Problem: Infection  Goal: Absence of Infection Signs and Symptoms  Outcome: Resolved     Problem: Self-Care Deficit  Goal: Improved Ability to Complete Activities of Daily Living  Outcome: Resolved     Problem: Fall Injury Risk  Goal: Absence of Fall and Fall-Related Injury  Outcome: Resolved

## 2021-06-04 NOTE — Unmapped (Addendum)
Dyspnea - Chronic Hypoxemic Respiratory Failure - Acute COVID-19:  Patient's shortness of breath is likely multifactorial.  She is at her baseline 3L supplemental oxygen.  The dyspnea has been most pronounced with activity.  She has no wheezing to suggest COPD exacerbation.  She does not appear clinically volume overloaded on my examination, if anything she appears somewhat dehydrated. BNP is elevated but close to baseline value.  She is currently positive for COVID-19 but unclear how acute this infection is - cycle time is quite elevated at 38.8 and she says she had viral symptoms during the first half of May but not recently.  CXR appears to be near baseline.  At risk for PE given chronic medical conditions and also recent COVID-19 infection, but D-dimer is quite reassuring <215 and she is on eliquis twice daily.   I think that dyspnea on exertion is likely related to patient's fatigue after COVID and also her worsening anemia (see below).  We will admit for close monitoring.  She was also started on remdesivir and received two doses prior to discharge. She was feeling much better after a unit of blood and gentle fluids overnight. In setting of response to volume and kidney injury, I suspect she was volume down. Will continue [facility] services, but anticipate she should continue to improve over the next few weeks.      Urinary Tract Infection:  Patient describes some lightheadedness, fatigue and does have recurrent urinary tract infections. She follows with [facility] ID and recently started on weekly fosfomycin to prevent infections although is still awaiting insurance clearance and has not yet taken a dose.  Received dose on 06/03/21. She will follow up with insurance, next dose due 06/10/21.     Normocytic Anemia:  Hemoglobin was 9.3 as recently as April 2022 and is now 7.0.  She denies any obvious signs of blood loss in her stool or elsewhere. Likely anemia of chronic disease with patient's ongoing [name] and multiple other chronic medical problems, perhaps exacerbated with recent viral illness.  Anemia is likely to be contributing to her lightheadedness and fatigue, although recent COVID-19 infection and resultant dehydration are likely driving this.  1 unit of blood ordered in the ED with appropriate improvement; slight drop down on AM of 6/8 after receiving IVF overnight.  Iron and ferritin low. Will start on oral iron QOD, have close follow up with PCP for CBC recheck, coordination of GI evaluation with endoscopy and colonoscopy.     Acute on Chronic Kidney Injury:  Baseline hemoglobin is 1.4 but for the past week has been elevated to approximately 2.6.  Was instructed to hold her torsemide by her infectious disease provider.  On my examination she appears clinically dehydrated (dry mucous membranes, minimal edema) and this fits with history of poor PO intake of solids or fluids for the past month.  Creatinine slowly downtrending with fluids to 2.07 on discharge. Instructed to hold torsemide and spironolactone until advised by PCP. Repeat labs by end of next week.     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  Denies chest pain. This is likely due to poor clearance from [name]. No further issues.     Other Medical Problems:  Atrial Fibrillation: Currently in NSR. Continue home amiodarone and eliquis.   [name] - TTR Cardiac Amyloid:  Hold home torsemide and spironolactone. Continue daily tafamidis.   COPD: No current wheezing and on home O2 requirement of 3L or less.  Continue home inhalers (spiriva and advair) with formulary substitutions. Albuterol PRN.   Type II DM: Hold home metformin while inpatient and also given elevated creatinine. Sliding scale insulin ordered. Will restart metformin on discharge given improvement.  Chronic Low Back Pain: Continue home gabapentin (instructed to take 300mg  TID, not 600mg  TID which she was still taking) and oxycontin BID. Peripheral Neuropathy: Continue home gabapentin as above

## 2021-06-04 NOTE — Unmapped (Signed)
Problem: Adult Inpatient Plan of Care  Goal: Plan of Care Review  Outcome: Ongoing - Unchanged  Goal: Patient-Specific Goal (Individualized)  Outcome: Ongoing - Unchanged  Goal: Absence of Hospital-Acquired Illness or Injury  Outcome: Ongoing - Unchanged  Goal: Optimal Comfort and Wellbeing  Outcome: Ongoing - Unchanged  Goal: Readiness for Transition of Care  Outcome: Ongoing - Unchanged  Goal: Rounds/Family Conference  Outcome: Ongoing - Unchanged     Problem: Infection  Goal: Absence of Infection Signs and Symptoms  Outcome: Ongoing - Unchanged     Problem: Self-Care Deficit  Goal: Improved Ability to Complete Activities of Daily Living  Outcome: Ongoing - Unchanged

## 2021-06-04 NOTE — Unmapped (Signed)
Case Management Brief Assessment    Minimal discharge needs have been identified for this patient.  Anticipated needs are: Resumed Home Health PT.  CM will continue to follow for avoidable delays and opportunities for progression of care.   06/04/2021 11:43 AM    General:  Care Manager assessed the patient by : Medical record review, Discussion with Clinical Care team    Extended Emergency Contact Information  Primary Emergency Contact: [name],[name]  Address: [address]           [place], [place] [address] [place]  Home Phone: [phone]  Work Phone: [phone]  Mobile Phone: [phone]  Relation: Daughter      Financial Information:  No concerns    Discharge Needs:  Concerns to be Addressed: denies needs/concerns at this time, discharge planning    Clinical risk factors: Multiple Diagnoses (Chronic), Readmission < 30 Days      Discharge Plan:  Screen findings are: Care Manager reviewed the plan of the patient's care with the Multidisciplinary Team. No discharge planning needs identified at this time. Care Manager will continue to manage plan and monitor patient's progress with the team.    Estimated Discharge Date: 06/04/2021    Initial Assessment complete?: Yes        Additional Information:    HCDM (patient stated preference): Devota Pace - Daughter - [phone]    Social Determinants of Health     Tobacco Use: Medium Risk   ??? Smoking Tobacco Use: Former Smoker   ??? Smokeless Tobacco Use: Never Used   Alcohol Use: Not At Risk   ??? How often do you have a drink containing alcohol?: Never   ??? How many drinks containing alcohol do you have on a typical day when you are drinking?: 1 - 2   ??? How often do you have 5 or more drinks on one occasion?: Never   Financial Resource Strain: Low Risk    ??? Difficulty of Paying Living Expenses: Not hard at all   Food Insecurity: No Food Insecurity   ??? Worried About Programme researcher, broadcasting/film/video in the Last Year: Never true   ??? Ran Out of Food in the Last Year: Never true   Transportation Needs: No Transportation Needs   ??? Lack of Transportation (Medical): No   ??? Lack of Transportation (Non-Medical): No   Physical Activity: Not on file   Stress: Not on file   Social Connections: Not on file   Intimate Partner Violence: Not on file   Depression: Not at risk   ??? PHQ-2 Score: 0   Housing/Utilities: Low Risk    ??? Within the past 12 months, have you ever stayed: outside, in a car, in a tent, in an overnight shelter, or temporarily in someone else's home (i.e. couch-surfing)?: No   ??? Are you worried about losing your housing?: No   ??? Within the past 12 months, have you been unable to get utilities (heat, electricity) when it was really needed?: No   Substance Use: Low Risk    ??? Taken prescription drugs for non-medical reasons: Never   ??? Taken illegal drugs: Never   ??? Patient indicated they have taken drugs in the past year for non-medical reasons: Yes, [positive answer(s)]: Not on file   Health Literacy: Medium Risk   ??? : Rarely       Predictive Model Details          50% (High)  Factor Value    Calculated 06/04/2021  08:03 28% Number of ED visits in last six months 9    [facility] Risk of Unplanned Readmission Model 15% Number of active Rx orders 40     11% Number of hospitalizations in last year 4     5% ECG/EKG order present in last 6 months     5% Latest calcium low (8.1 mg/dL)     4% Latest BUN high (40 mg/dL)     4% Encounter of ten days or longer in last year present     4% Imaging order present in last 6 months     4% Age 80     3% Latest hemoglobin low (7.6 g/dL)     3% Phosphorous result present     3% Charlson Comorbidity Index 4     3% Diagnosis of deficiency anemia present     2% Active anticoagulant Rx order present     2% Latest creatinine high (2.07 mg/dL)     2% Diagnosis of renal failure present     1% Future appointment scheduled     1% Current length of stay 1.096 days

## 2021-06-04 NOTE — Unmapped (Signed)
Special airborne precautions maintained. ON O2 via nasal cannula to maintain saturation. Ambulated to bathroom for bowel movement tonight. Eating and drinking with no complaint of GI difficulty. Occasional cough noted after ambulating. Steady gait when up. IV fluid per order.

## 2021-06-04 NOTE — Unmapped (Signed)
Physician Discharge Summary HBR  3 BT1 HBR  [address]  [place] [address]  Dept: [phone]  Loc: [phone]     Identifying Information:   [address]bara [address]er  04/02/1941  [id number]    Primary Care Physician: Jacquiline Doe, MD     Code Status: Full Code    Admit Date: 06/03/2021    Discharge Date: 06/04/2021     Discharge To: Home with Home Health and/or PT/OT    Discharge Service: HBR - HBC: Hospitalist Service #2     Discharge Attending Physician: Novella Rob, MD    Discharge Diagnoses:   Principal Problem:    Persistent fatigue after COVID-19 POA: Not Applicable  Active Problems:    Spinal stenosis of lumbar region POA: Yes    COPD (chronic obstructive pulmonary disease) (CMS-HCC) POA: Yes    Essential hypertension POA: Yes    Type 2 diabetes mellitus with stage 3b chronic kidney disease, without long-term current use of insulin (CMS-HCC) POA: Yes    Chronic cystitis POA: Yes    Peripheral neuropathy POA: Yes    Chronic respiratory failure with hypoxia (CMS-HCC) POA: Yes    Anemia POA: Yes    Anemia in chronic kidney disease POA: Yes    Chronic kidney disease, stage III (moderate) (CMS-HCC) POA: Yes    Pulmonary hypertension (CMS-HCC) POA: Yes    Chronic prescription opiate use POA: Not Applicable    Dyspnea on exertion POA: Yes    Cardiac amyloidosis (CMS-HCC) POA: Yes  Resolved Problems:    * No resolved hospital problems. *      Hospital Course:   Dyspnea - Chronic Hypoxemic Respiratory Failure - Acute COVID-19:  Patient's shortness of breath is likely multifactorial.  She is at her baseline 3L supplemental oxygen.  The dyspnea has been most pronounced with activity.  She has no wheezing to suggest COPD exacerbation.  She does not appear clinically volume overloaded on my examination, if anything she appears somewhat dehydrated. BNP is elevated but close to baseline value.  She is currently positive for COVID-19 but unclear how acute this infection is - cycle time is quite elevated at 38.8 and she says she had viral symptoms during the first half of May but not recently.  CXR appears to be near baseline.  At risk for PE given chronic medical conditions and also recent COVID-19 infection, but D-dimer is quite reassuring <215 and she is on eliquis twice daily.   I think that dyspnea on exertion is likely related to patient's fatigue after COVID and also her worsening anemia (see below).  We will admit for close monitoring.  She was also started on remdesivir and received two doses prior to discharge. She was feeling much better after a unit of blood and gentle fluids overnight. In setting of response to volume and kidney injury, I suspect she was volume down. Will continue [facility] services, but anticipate she should continue to improve over the next few weeks.      Urinary Tract Infection:  Patient describes some lightheadedness, fatigue and does have recurrent urinary tract infections. She follows with [facility] ID and recently started on weekly fosfomycin to prevent infections although is still awaiting insurance clearance and has not yet taken a dose.  Received dose on 06/03/21. She will follow up with insurance, next dose due 06/10/21.     Normocytic Anemia:  Hemoglobin was 9.3 as recently as April 2022 and is now 7.0.  She denies any obvious signs of blood  loss in her stool or elsewhere. Likely anemia of chronic disease with patient's ongoing [name] and multiple other chronic medical problems, perhaps exacerbated with recent viral illness.  Anemia is likely to be contributing to her lightheadedness and fatigue, although recent COVID-19 infection and resultant dehydration are likely driving this.  1 unit of blood ordered in the ED with appropriate improvement; slight drop down on AM of 6/8 after receiving IVF overnight.  Iron and ferritin low. Will start on oral iron QOD, have close follow up with PCP for CBC recheck, coordination of GI evaluation with endoscopy and colonoscopy.     Acute on Chronic Kidney Injury:  Baseline hemoglobin is 1.4 but for the past week has been elevated to approximately 2.6.  Was instructed to hold her torsemide by her infectious disease provider.  On my examination she appears clinically dehydrated (dry mucous membranes, minimal edema) and this fits with history of poor PO intake of solids or fluids for the past month.  Creatinine slowly downtrending with fluids to 2.07 on discharge. Instructed to hold torsemide and spironolactone until advised by PCP. Repeat labs by end of next week.     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  Denies chest pain. This is likely due to poor clearance from [name]. No further issues.     Other Medical Problems:  Atrial Fibrillation: Currently in NSR. Continue home amiodarone and eliquis.   [name] - TTR Cardiac Amyloid:  Hold home torsemide and spironolactone. Continue daily tafamidis.   COPD: No current wheezing and on home O2 requirement of 3L or less. Continue home inhalers (spiriva and advair) with formulary substitutions. Albuterol PRN.   Type II DM: Hold home metformin while inpatient and also given elevated creatinine. Sliding scale insulin ordered. Will restart metformin on discharge given improvement.  Chronic Low Back Pain: Continue home gabapentin (instructed to take 300mg  TID, not 600mg  TID which she was still taking) and oxycontin BID.   Peripheral Neuropathy: Continue home gabapentin as above    Outpatient Provider Follow Up Issues:   Follow up CBC and BMP within 2 weeks  Will likely need torsemide restarted once crt normalized  ?insurance coverage with fosfomycin    Touchbase with Outpatient Provider:  Warm Handoff: Completed on 06/04/21 by Novella Rob  (Attending) via [facility]  ______________________________________________________________________  Discharge Medications:     Your Medication List      START taking these medications    ferrous sulfate 325 (65 FE) MG tablet  Take 1 tablet (325 mg total) by mouth every other day.        CHANGE how you take these medications    spironolactone 50 MG tablet  Commonly known as: ALDACTONE  Take 1 tablet (50 mg total) by mouth in the morning.  Start taking on: June 18, 2021  What changed: These instructions start on June 18, 2021. If you are unsure what to do until then, ask your doctor or other care provider.     torsemide 20 MG tablet  Commonly known as: DEMADEX  Take 1 tablet (20 mg total) by mouth daily. May also take 1 tablet (20 mg total) daily as needed (weight gain).  Start taking on: June 18, 2021  What changed: These instructions start on June 18, 2021. If you are unsure what to do until then, ask your doctor or other care provider.        CONTINUE taking these medications  ACCU-CHEK AVIVA PLUS TEST STRP Strp  Generic drug: blood sugar diagnostic  USE TO CHECK BLOOD SUGAR 3 TIMES A DAY BEFORE MEALS     acetaminophen 500 MG tablet  Commonly known as: TYLENOL  Take 1,000 mg by mouth daily as needed for pain.     ADVAIR HFA 115-21 mcg/actuation inhaler  Generic drug: fluticasone propion-salmeteroL  Inhale 2 puffs Two (2) times a day.     albuterol 90 mcg/actuation inhaler  Commonly known as: PROVENTIL HFA;VENTOLIN HFA  Inhale 2 puffs every eight (8) hours as needed for wheezing.     amiodarone 200 MG tablet  Commonly known as: PACERONE  Take 1 tablet (200 mg total) by mouth daily.     apixaban 5 mg Tab  Commonly known as: ELIQUIS  Take 1 tablet (5 mg total) by mouth Two (2) times a day.     estradioL 0.01 % (0.1 mg/gram) vaginal cream  Commonly known as: ESTRACE  INSERT ONE GRAM VAGINALLY AT BEDTIME     fosfomycin 3 gram Pack  Commonly known as: MONUROL  Take 3 g by mouth once a week for 12 doses.  Start taking on: June 10, 2021     gabapentin 300 MG capsule  Commonly known as: NEURONTIN  Take 1 capsule (300 mg total) by mouth Three (3) times a day.     lancets Misc  1 each by Miscellaneous route daily. Accu Check metFORMIN 500 MG tablet  Commonly known as: GLUCOPHAGE  Take 1 tablet (500 mg total) by mouth 2 (two) times a day with meals.     montelukast 10 mg tablet  Commonly known as: SINGULAIR  Take 1 tablet (10 mg total) by mouth daily.     NARCAN 4 mg/actuation nasal spray  Generic drug: naloxone  1 spray into alternating nostrils once as needed (opioid overdose).     oxyCODONE 10 mg Tr12 12 hr crush resistant ER/CR tablet  Commonly known as: OxyCONTIN  Take 10 mg by mouth every twelve (12) hours.     OXYGEN-AIR DELIVERY SYSTEMS MISC  5 L by Miscellaneous route.     rosuvastatin 5 MG tablet  Commonly known as: CRESTOR  Take 1 tablet (5 mg total) by mouth every other day.     SHINGRIX (PF) 50 mcg/0.5 mL Susr injection  Generic drug: varicella-zoster gE-AS01B (PF)  Inject 0.5 mL into the muscle.     tiotropium bromide 2.5 mcg/actuation inhalation mist  Commonly known as: SPIRIVA RESPIMAT  Inhale 2 puffs daily.     VYNDAMAX 61 mg Cap  Generic drug: tafamidis  Take 1 capsule (61 mg) by mouth daily.            Allergies:  Nitrofurantoin and Lipitor [atorvastatin]  ______________________________________________________________________  Pending Test Results:  Pending Labs     Order Current Status    Urine Culture In process          Most Recent Labs:  All lab results last 24 hours -   Recent Results (from the past 24 hour(s))   POCT Glucose    Collection Time: 06/03/21  4:59 PM   Result Value Ref Range    Glucose, POC 130 70 - 179 mg/dL   POCT Glucose    Collection Time: 06/03/21  9:13 PM   Result Value Ref Range    Glucose, POC 156 70 - 179 mg/dL   CBC    Collection Time: 06/04/21  6:38 AM   Result Value Ref Range    WBC  6.9 3.6 - 11.2 10*9/L    RBC 3.08 (L) 3.95 - 5.13 10*12/L    HGB 7.6 (L) 11.3 - 14.9 g/dL    HCT 16.1 (L) 09.6 - 44.0 %    MCV 78.6 77.6 - 95.7 fL    MCH 24.5 (L) 25.9 - 32.4 pg    MCHC 31.2 (L) 32.0 - 36.0 g/dL    RDW 04.5 (H) 40.9 - 15.2 %    MPV 7.7 6.8 - 10.7 fL    Platelet 287 150 - 450 10*9/L Comprehensive metabolic panel    Collection Time: 06/04/21  6:38 AM   Result Value Ref Range    Sodium 137 135 - 145 mmol/L    Potassium 5.2 (H) 3.4 - 4.8 mmol/L    Chloride 102 98 - 107 mmol/L    CO2 28.9 20.0 - 31.0 mmol/L    Anion Gap 6 5 - 14 mmol/L    BUN 40 (H) 9 - 23 mg/dL    Creatinine 8.11 (H) 0.60 - 0.80 mg/dL    BUN/Creatinine Ratio 19     eGFR [name]-EPI (2021) Female 24 (L) >=60 mL/min/1.68m2    Glucose 108 70 - 179 mg/dL    Calcium 8.1 (L) 8.7 - 10.4 mg/dL    Albumin 3.5 3.4 - 5.0 g/dL    Total Protein 6.9 5.7 - 8.2 g/dL    Total Bilirubin 0.5 0.3 - 1.2 mg/dL    AST 10 <=91 U/L    ALT <7 (L) 10 - 49 U/L    Alkaline Phosphatase 51 46 - 116 U/L   Magnesium Level    Collection Time: 06/04/21  6:38 AM   Result Value Ref Range    Magnesium 3.6 (H) 1.6 - 2.6 mg/dL   Phosphorus Level    Collection Time: 06/04/21  6:38 AM   Result Value Ref Range    Phosphorus 5.3 (H) 2.4 - 5.1 mg/dL   Prepare RBC    Collection Time: 06/04/21  7:00 AM   Result Value Ref Range    Crossmatch Compatible     Unit Blood Type A Pos     ISBT Number 6200     Unit # [id number]     Status Transfused     Product ID Red Blood Cells     PRODUCT CODE [id number]    POCT Glucose    Collection Time: 06/04/21  8:10 AM   Result Value Ref Range    Glucose, POC 107 70 - 179 mg/dL   POCT Glucose    Collection Time: 06/04/21 12:02 PM   Result Value Ref Range    Glucose, POC 198 (H) 70 - 179 mg/dL       Relevant Studies/Radiology:  XR Chest 1 view Portable    Result Date: 06/03/2021  EXAM: XR CHEST PORTABLE DATE: 06/03/2021 1:27 AM ACCESSION: [id number] UN DICTATED: 06/03/2021 2:28 AM INTERPRETATION LOCATION: [facility] CLINICAL INDICATION: 80 years old Female with DYSPNEA  COMPARISON: 04/23/2021 chest radiograph. TECHNIQUE: Portable Chest Radiograph. FINDINGS: Adequate lung volumes. Interstitial prominence with perihilar fullness. No focal consolidation. No pleural effusion or pneumothorax. Unchanged cardiomediastinal silhouette.     Mild pulmonary edema without focal consolidation. ==================== ATTENDING ADDENDUM by Dr.Muthu Royanne Foots MD(06/03/2021 6:13 AM): On review, the following additional findings were noted: Peripheral predominant hazy airspace opacities bilaterally, may represent infection versus edema.    CT head WO contrast    Result Date: 06/03/2021  EXAM: Computed tomography, head or brain without contrast material. DATE: 06/03/2021 2:22 AM ACCESSION: [id number] UN DICTATED:  06/03/2021 2:26 AM INTERPRETATION LOCATION: [facility] CLINICAL INDICATION: 80 years old Female with blurred vision ; Vision loss, binocular  COMPARISON: 01/07/2017 CT head. TECHNIQUE: Axial CT images of the head  from skull base to vertex without contrast. FINDINGS: Moderate cerebral volume loss with ex vacuo dilatation of the CSF-containing spaces. There is no midline shift. No mass lesion. The basal cisterns are patent. There is no evidence of acute infarct. No acute intracranial hemorrhage. No fractures are evident. The sinuses are pneumatized.        No acute intracranial abnormality.    ______________________________________________________________________  Discharge Instructions:   Activity Instructions     Activity as tolerated                     Follow Up instructions and Outpatient Referrals     Discharge instructions          Appointments which have been scheduled for you    Jun 23, 2021 10:30 AM  (Arrive by 10:15 AM)  NEW GENERAL with Sylvan Cheese, AGNP  [facility] UROLOGY [facility]) [address]  [place] [address]  [phone]        Jul 02, 2021  3:00 PM  (Arrive by 2:45 PM)  RETURN TELEPHONE with Melanee Left, MD  [facility] INFECTIOUS DISEASES [place] [facility]) [address]  [place] [address]  [phone]   You will receive a phone call from your provider at the time of your visit.    If this is an emergency, call 911 or go to the nearest urgent care or emergency room.            Jul 10, 2021  4:10 PM  (Arrive by 3:55 PM)  RETURN VIDEO DIRECT LINK with Artelia Laroche, MD  [facility] INTERNAL MEDICINE [place] [facility]) [address]  [place] [address]  [phone]        Aug 29, 2021 10:00 AM  (Arrive by 9:45 AM)  RETURN HEART FAILURE [place] with Carin Hock, MD  [facility] CARDIOLOGY [place] [facility]) [address]  [place] [address]  [phone]        Sep 03, 2021  2:30 PM  (Arrive by 2:15 PM)  RETURN  GENERAL with Melanee Left, MD  [facility] INFECTIOUS DISEASES [place] [facility]) [address]  [name] [address]  [phone]             ______________________________________________________________________  Discharge Day Services:  BP 117/57  - Pulse 65  - Temp 37.1 ??C (98.8 ??F) (Temporal)  - Resp 18  - Ht 162.6 cm (5' 4)  - Wt 96.2 kg (212 lb)  - SpO2 97%  - BMI 36.39 kg/m??     Pt seen on the day of discharge and determined appropriate for discharge.    Condition at Discharge: fair    Length of Discharge: I spent greater than 30 mins in the discharge of this patient.

## 2021-06-05 DIAGNOSIS — I5032 Chronic diastolic (congestive) heart failure: Principal | ICD-10-CM

## 2021-06-05 NOTE — Unmapped (Signed)
Called pt's daughter in response to mychart message asking whether she should continue to hold torsemide/spironolactone. These were held on discharge from recent hospitalization. She ended up taking 40mg  torsemide this morning because her weight was up 3lbs and she felt swollen up especially in her hands. She feels better now after taking it. Advised her to continue torsemide PRN and hold spironolactone. She will get labs checked early next week. Follow-up in clinic in 2 weeks.

## 2021-06-06 DIAGNOSIS — Z09 Encounter for follow-up examination after completed treatment for conditions other than malignant neoplasm: Principal | ICD-10-CM

## 2021-06-06 NOTE — Unmapped (Signed)
Pt states the new covid banner is incorrect.  She's never had symptoms and they told her the Covid pos. Was from prior case.  Pt denies need for [employer].  COMPLETE COVID states does not have Covid currently 06/06/2021; No outreach required (declines).

## 2021-06-09 ENCOUNTER — Ambulatory Visit: Admit: 2021-06-09 | Discharge: 2021-06-10 | Payer: MEDICARE

## 2021-06-09 LAB — CBC
HEMATOCRIT: 25.8 % — ABNORMAL LOW (ref 34.0–44.0)
HEMOGLOBIN: 7.8 g/dL — ABNORMAL LOW (ref 11.3–14.9)
MEAN CORPUSCULAR HEMOGLOBIN CONC: 30.3 g/dL — ABNORMAL LOW (ref 32.0–36.0)
MEAN CORPUSCULAR HEMOGLOBIN: 23.7 pg — ABNORMAL LOW (ref 25.9–32.4)
MEAN CORPUSCULAR VOLUME: 78.3 fL (ref 77.6–95.7)
MEAN PLATELET VOLUME: 7.1 fL (ref 6.8–10.7)
PLATELET COUNT: 276 10*9/L (ref 150–450)
RED BLOOD CELL COUNT: 3.29 10*12/L — ABNORMAL LOW (ref 3.95–5.13)
RED CELL DISTRIBUTION WIDTH: 20.7 % — ABNORMAL HIGH (ref 12.2–15.2)
WBC ADJUSTED: 5.7 10*9/L (ref 3.6–11.2)

## 2021-06-09 LAB — BASIC METABOLIC PANEL
ANION GAP: 10 mmol/L (ref 5–14)
BLOOD UREA NITROGEN: 45 mg/dL — ABNORMAL HIGH (ref 9–23)
BUN / CREAT RATIO: 20
CALCIUM: 8.6 mg/dL — ABNORMAL LOW (ref 8.7–10.4)
CHLORIDE: 103 mmol/L (ref 98–107)
CO2: 27.8 mmol/L (ref 20.0–31.0)
CREATININE: 2.21 mg/dL — ABNORMAL HIGH
EGFR CKD-EPI (2021) FEMALE: 22 mL/min/{1.73_m2} — ABNORMAL LOW (ref >=60)
GLUCOSE RANDOM: 171 mg/dL (ref 70–179)
POTASSIUM: 4.5 mmol/L (ref 3.4–4.8)
SODIUM: 141 mmol/L (ref 135–145)

## 2021-06-09 LAB — B-TYPE NATRIURETIC PEPTIDE: B-TYPE NATRIURETIC PEPTIDE: 394.47 pg/mL — ABNORMAL HIGH (ref <=100)

## 2021-06-09 LAB — MAGNESIUM: MAGNESIUM: 3 mg/dL — ABNORMAL HIGH (ref 1.6–2.6)

## 2021-06-09 NOTE — Unmapped (Signed)
Called pt and daughter to advise of lab results from today showing Hgb still very low but not decreasing from hospitalization (7.6 --> 7.8). Reached out to PCP regarding anemia. She has been taking either 40mg  or 20mg  torsemide PRN. Cr stable at 2.1. She feels pretty good, is just tired.

## 2021-06-10 ENCOUNTER — Telehealth: Admit: 2021-06-10 | Discharge: 2021-06-11 | Payer: MEDICARE

## 2021-06-10 DIAGNOSIS — B948 Sequelae of other specified infectious and parasitic diseases: Principal | ICD-10-CM

## 2021-06-10 DIAGNOSIS — R0600 Dyspnea, unspecified: Principal | ICD-10-CM

## 2021-06-10 DIAGNOSIS — Z09 Encounter for follow-up examination after completed treatment for conditions other than malignant neoplasm: Principal | ICD-10-CM

## 2021-06-10 DIAGNOSIS — R5383 Other fatigue: Principal | ICD-10-CM

## 2021-06-10 DIAGNOSIS — N179 Acute kidney failure, unspecified: Principal | ICD-10-CM

## 2021-06-10 DIAGNOSIS — D509 Iron deficiency anemia, unspecified: Principal | ICD-10-CM

## 2021-06-10 MED ORDER — FOSFOMYCIN TROMETHAMINE 3 GRAM ORAL PACKET
ORAL | 0 refills | 84 days | Status: CP
Start: 2021-06-10 — End: 2021-08-27

## 2021-06-10 NOTE — Unmapped (Addendum)
Internal Medicine Hospital Follow-up Video Visit    This visit is conducted via video conferencing.    Contact Information  Person Contacted: Patient  Contact Phone number: [phone] (home)   Is there someone else in the room? Yes. What is your relationship? family member. Do you want this person here for the visit? yes.  Patient agreed to a video visit    Barbara Huber is a 80 y.o. female  participating in a video visit.    CHIEF COMPLAINT/HISTORY OF PRESENT ILLNESS:   Hospital Follow-Up for Follow-up [facility] follow up)    History obtained from: Family member and Patient  Date of Hospitalization Discharge:  06/04/21     HPI:  She still feels out of energy, and cold all the time.  They report that she already has been approved for outpatient IV iron, and in April received multiple iron infusions in the hospital. She got about 3 infusions in April.  She continues to feel short of breath at times, but unchanged from discharge.  No new complaints.        MEDICATIONS AND ALLERGIES:   Reviewed and updated in Epic.  Medication and allergy review was completed   SOCIAL/FAMILY HISTORY:  Social History:  Reviewed in Epic.  Biopsychosocial barriers to care and advanced directives were addressed by the social worker and discussed during the visit. Please see their note within this encounter.    REVIEW OF SYSTEMS:    All other systems reviewed are negative except as noted here or in HPI.    Medication adherence and barriers to the treatment plan have been addressed. Opportunities to optimize healthy behaviors have been discussed. Patient / caregiver voiced understanding.      Objective:  Alert and conversant, NAD, sitting in chair, answering questions appropriately, no dyspnea      Labs:  Reviewed from discharge.  See Epic labs.     Radiology: Reviewed radiology studies from discharge. See in Epic.   Reviewed: Discharge summary and Labs; See results in Epic  Discussed care with: Social worker  Architectural technologist by phone or other method (Encounter type: Patient Outreach):     Patient Outreach History (Since 05/27/2021)     COVID-19 Follow-Up     Date Method of Outreach Associated Actions User Next Outreach    06/06/2021  2:04 PM Telephone  Perlie Gold, RN     06/03/2021  4:54 PM Did Not Contact  Joesphine Bare, RN 06/05/2021               Successful contact made or 2 unsuccessful attempts within 2 business days    Assessment & Plan:  1. Iron deficiency anemia, unspecified iron deficiency anemia type    2. Hospital discharge follow-up    3. Persistent fatigue after COVID-19    4. Dyspnea on exertion    5. AKI (acute kidney injury) (CMS-HCC)      Hospital follow-up for persistent fatigue after COVID19, and anemia  -received 1 unit transfusion, hgb was 7 on admission and was 7.8 yesterday  -tolerating oral iron well, will increase to daily oral iron with vitamin C  -Given declining kidney function, possibly erythropoietin may be beneficial in the future  -her ferritin was in the 30's range about a month ago after receiving IV iron in hospital and now is down to 9.  It seems likely that she might be bleeding.  Ordered endoscopy and colonoscopy.    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) and [name]  -jardiance stopped previously due to  recurrent UTI's  -stopped spiro at discharge, currently taking torsemide 2 alternating with 1 daily  -renal function has not returned to baseline with GFR 22 per labs yesterday  -need to check medications, on review today, will need to stop metformin    Recurrent UTI  -Has follow-up with ID  -per inpatient team, there may be issues with insurance coverage with fosfomycin    Type 2 DM  -will stop metformin due to GFR < 30    COPD  -stable, follows with pulm    They declined need for SW evaluation, and already have appointment scheduled with PCP, cardiology and ID        Medication adjustments:   1. Change ferrous sulfate to daily, and take with vitamin C or citrus      Patient provided with an updated and reconciled medications list.          The patient reports they are currently: at home. I spent 12 minutes on the real-time audio and video with the patient on the date of service. I spent an additional 20 minutes on pre- and post-visit activities on the date of service.     The patient was physically located in [place] or a state in which I am permitted to provide care. The patient and/or parent/guardian understood that s/he may incur co-pays and cost sharing, and agreed to the telemedicine visit. The visit was reasonable and appropriate under the circumstances given the patient's presentation at the time.    The patient and/or parent/guardian has been advised of the potential risks and limitations of this mode of treatment (including, but not limited to, the absence of in-person examination) and has agreed to be treated using telemedicine. The patient's/patient's family's questions regarding telemedicine have been answered.     If the visit was completed in an ambulatory setting, the patient and/or parent/guardian has also been advised to contact their provider???s office for worsening conditions, and seek emergency medical treatment and/or call 911 if the patient deems either necessary.    SUMMARY OF ASSOCIATED HOSPITALIZATION    Admit Date: 06/03/2021  ??  Discharge Date: 06/04/2021   ??  Discharge Diagnoses:   Principal Problem:    Persistent fatigue after COVID-19 POA: Not Applicable  Active Problems:    Spinal stenosis of lumbar region POA: Yes    COPD (chronic obstructive pulmonary disease) (CMS-HCC) POA: Yes    Essential hypertension POA: Yes    Type 2 diabetes mellitus with stage 3b chronic kidney disease, without long-term current use of insulin (CMS-HCC) POA: Yes    Chronic cystitis POA: Yes    Peripheral neuropathy POA: Yes    Chronic respiratory failure with hypoxia (CMS-HCC) POA: Yes    Anemia POA: Yes    Anemia in chronic kidney disease POA: Yes    Chronic kidney disease, stage III (moderate) (CMS-HCC) POA: Yes    Pulmonary hypertension (CMS-HCC) POA: Yes    Chronic prescription opiate use POA: Not Applicable    Dyspnea on exertion POA: Yes    Cardiac amyloidosis (CMS-HCC) POA: Yes  Resolved Problems:    * No resolved hospital problems. *  ??  ??  Hospital Course:   Dyspnea - Chronic Hypoxemic Respiratory Failure - Acute COVID-19:????Patient's shortness of breath is likely multifactorial. ??She is??at her??baseline 3L supplemental oxygen. ??The dyspnea has been most pronounced with activity. ??She has no wheezing to suggest COPD exacerbation. ??She does not appear clinically volume overloaded on my examination, if anything she appears somewhat dehydrated.??BNP is  elevated but close to baseline value. ??She is currently positive for COVID-19 but unclear how acute this infection is - cycle time is quite elevated at 38.8 and she says she had viral symptoms during the first half of May but not recently. ??CXR appears to be near baseline. ??At risk for PE given chronic medical conditions and also recent COVID-19 infection, but D-dimer is quite reassuring <215??and she is on eliquis twice daily.??????I think that dyspnea on exertion is likely related to patient's fatigue after COVID and also her worsening anemia (see below). ??We will??admit for close monitoring. ??She was also started on remdesivir and received two doses prior to discharge. She was feeling much better after a unit of blood and gentle fluids overnight. In setting of response to volume and kidney injury, I suspect she was volume down. Will continue [facility] services, but anticipate she should continue to improve over the next few weeks.  ????  Urinary Tract Infection: ??Patient describes some lightheadedness, fatigue??and does have recurrent urinary tract infections. She follows with [facility] ID and recently started on weekly fosfomycin to prevent infections although is still awaiting insurance clearance and has not yet taken a dose.????Received dose on 06/03/21. She will follow up with insurance, next dose due 06/10/21.  ??  Normocytic Anemia: ??Hemoglobin was 9.3 as recently as April 2022 and is now 7.0. ??She denies any obvious signs of blood loss in her stool or elsewhere. Likely anemia of chronic disease with patient's ongoing [name] and multiple other chronic medical problems, perhaps exacerbated with recent viral illness. ??Anemia is likely to??be contributing to her lightheadedness and fatigue, although??recent??COVID-19 infection and resultant dehydration are likely driving this. ??1 unit of blood ordered in the ED with appropriate improvement; slight drop down on AM of 6/8 after receiving IVF overnight. ??Iron and ferritin low. Will start on oral iron QOD, have close follow up with PCP for CBC recheck, coordination of GI evaluation with endoscopy and colonoscopy.  ??  Acute on Chronic Kidney Injury: ??Baseline hemoglobin is 1.4 but for the past week has been elevated to approximately 2.6.????Was instructed to hold her torsemide by her infectious disease provider. ??On my examination she appears clinically dehydrated (dry mucous membranes, minimal edema) and this fits with history of poor PO intake of solids or fluids for the past month. ??Creatinine slowly downtrending with fluids to 2.07 on discharge. Instructed to hold torsemide and spironolactone??until advised by PCP. Repeat labs by end of next week.

## 2021-06-10 NOTE — Unmapped (Signed)
Addended by: Verne Grain on: 06/10/2021 03:47 PM     Modules accepted: Orders

## 2021-06-10 NOTE — Unmapped (Signed)
[facility] at [facility], [place]       Type of visit:  video    Reason for visit: f/u    Questions / Concerns that need to be addressed: f/u    General Consent to Treat (GCT) for non-epic video visits only: Verbal consent      Allergies reviewed: Yes    Medication reviewed: Yes  Pended refills? No        HCDM reviewed and updated in Epic:    We are working to make sure all of our patients??? wishes are updated in Epic and part of that is documenting a Environmental health practitioner for each patient  A Health Care Decision Maker is someone you choose who can make health care decisions for you if you are not able - who would you most want to do this for you????  is already up to date.        BPAs completed:  PHQ2  AUDIT - Alcohol Screen  HARK - Interpersonal Violence      COVID Vaccination:  If Care Gap for COVID vaccine is present:  Have you received a COVID vaccine? yes  If yes: How many doses have you received? 0/1/2: 2   Type of Vaccine received?    Date of 1st dose:    Date of 2nd dose:    If no: Are you interested in scheduling?         __________________________________________________________________________________________    SCREENINGS COMPLETED IN FLOWSHEETS    HARK Screening  HARK Screening  Within the last year, have you been humiliated or emotionally abused in other ways by your partner or ex-partner?: No  Within the last year, have you been afraid of your partner or ex-partner?: No  Within the last year, have you been raped or forced to have any kind of sexual activity by your partner or ex-partner?: No  Within the last year, have you been kicked, hit, slapped, or otherwise physically hurt by your partner or ex-partner?: No    AUDIT  AUDIT - C Score (Part 1): 0    PHQ2  PHQ-2 Total Score : 0

## 2021-06-13 ENCOUNTER — Emergency Department: Admit: 2021-06-13 | Discharge: 2021-06-13 | Disposition: A | Discharge status: Home / Self Care | Payer: MEDICARE

## 2021-06-13 ENCOUNTER — Ambulatory Visit: Admit: 2021-06-13 | Discharge: 2021-06-13 | Disposition: A | Discharge status: Home / Self Care | Payer: MEDICARE

## 2021-06-13 LAB — COMPREHENSIVE METABOLIC PANEL
ALBUMIN: 3.8 g/dL (ref 3.4–5.0)
ALKALINE PHOSPHATASE: 53 U/L (ref 46–116)
ALT (SGPT): <7 U/L — ABNORMAL LOW (ref 10–49)
ANION GAP: 7 mmol/L (ref 5–14)
AST (SGOT): 17 U/L (ref <=34)
BILIRUBIN TOTAL: 0.3 mg/dL (ref 0.3–1.2)
BLOOD UREA NITROGEN: 34 mg/dL — ABNORMAL HIGH (ref 9–23)
BUN / CREAT RATIO: 18
CALCIUM: 8.7 mg/dL (ref 8.7–10.4)
CHLORIDE: 101 mmol/L (ref 98–107)
CO2: 31.4 mmol/L — ABNORMAL HIGH (ref 20.0–31.0)
CREATININE: 1.9 mg/dL — ABNORMAL HIGH
EGFR CKD-EPI (2021) FEMALE: 27 mL/min/{1.73_m2} — ABNORMAL LOW (ref >=60)
GLUCOSE RANDOM: 134 mg/dL (ref 70–179)
POTASSIUM: 4.5 mmol/L (ref 3.4–4.8)
PROTEIN TOTAL: 7.5 g/dL (ref 5.7–8.2)
SODIUM: 139 mmol/L (ref 135–145)

## 2021-06-13 LAB — HIGH SENSITIVITY TROPONIN I - SERIAL: HIGH SENSITIVITY TROPONIN I: 42 ng/L (ref <=34)

## 2021-06-13 LAB — CBC W/ AUTO DIFF
BASOPHILS ABSOLUTE COUNT: 0.2 10*9/L — ABNORMAL HIGH (ref 0.0–0.1)
BASOPHILS RELATIVE PERCENT: 2.7 %
EOSINOPHILS ABSOLUTE COUNT: 0.3 10*9/L (ref 0.0–0.5)
EOSINOPHILS RELATIVE PERCENT: 4.4 %
HEMATOCRIT: 26.6 % — ABNORMAL LOW (ref 34.0–44.0)
HEMOGLOBIN: 7.9 g/dL — ABNORMAL LOW (ref 11.3–14.9)
LYMPHOCYTES ABSOLUTE COUNT: 1.7 10*9/L (ref 1.1–3.6)
LYMPHOCYTES RELATIVE PERCENT: 24.9 %
MEAN CORPUSCULAR HEMOGLOBIN CONC: 29.8 g/dL — ABNORMAL LOW (ref 32.0–36.0)
MEAN CORPUSCULAR HEMOGLOBIN: 23.4 pg — ABNORMAL LOW (ref 25.9–32.4)
MEAN CORPUSCULAR VOLUME: 78.4 fL (ref 77.6–95.7)
MEAN PLATELET VOLUME: 7.8 fL (ref 6.8–10.7)
MONOCYTES ABSOLUTE COUNT: 0.8 10*9/L (ref 0.3–0.8)
MONOCYTES RELATIVE PERCENT: 11.8 %
NEUTROPHILS ABSOLUTE COUNT: 3.8 10*9/L (ref 1.8–7.8)
NEUTROPHILS RELATIVE PERCENT: 56.2 %
NUCLEATED RED BLOOD CELLS: 0 /100{WBCs} (ref <=4)
PLATELET COUNT: 217 10*9/L (ref 150–450)
RED BLOOD CELL COUNT: 3.39 10*12/L — ABNORMAL LOW (ref 3.95–5.13)
RED CELL DISTRIBUTION WIDTH: 20.6 % — ABNORMAL HIGH (ref 12.2–15.2)
WBC ADJUSTED: 6.8 10*9/L (ref 3.6–11.2)

## 2021-06-13 LAB — B-TYPE NATRIURETIC PEPTIDE: B-TYPE NATRIURETIC PEPTIDE: 504.58 pg/mL — ABNORMAL HIGH (ref <=100)

## 2021-06-13 LAB — SLIDE REVIEW

## 2021-06-13 MED ADMIN — furosemide (LASIX) injection 40 mg: 40 mg | INTRAVENOUS | @ 05:00:00 | Stop: 2021-06-13

## 2021-06-13 NOTE — Unmapped (Signed)
Pt with bilateral lower extremity swelling that she first noticed 2 days ago. Pt with history of CHF and states this feel similar. MD discontinued diuretic last week after hospital admission. Pt on 3L O2 at baseline. No acute distress at this time,

## 2021-06-14 NOTE — Unmapped (Signed)
[facility] Emergency Department Provider Note      ED Course, Assessment and Plan     Initial Clinical Impression:    Barbara Huber is a 80 y.o. female with a past medical history of COPD (on 3 L nasal cannula at home), pulmonary hypertension, amyloidosis, CHF, chronic anemia, chronic atrial fibrillation, renal stones, diabetes who presents the emergency department today for evaluation of several days of lower extremity edema and dyspnea on exertion in the setting of recently being taken off her torsemide and spironolactone.    On exam, the patient is chronically ill-appearing but in no acute distress.  Resting comfortably in bed on home 3 L.  All vital signs are within normal limits.  Cardiopulmonary exam significant for faint crackles at the bilateral bases, no wheezing.  Abdominal exam is benign.  She has 1+ pitting edema to the proximal shins.    CMP thus far with creatinine of 1.9, slightly improved from usual baseline.  Troponin at 42, near her baseline.  EKG with poor R wave progression but no new ischemic change when compared to previous.  Patient with a hemoglobin of 7.9, unchanged from previous.  BNP slightly elevated from baseline at 504.  Chest x-ray with evidence of slightly increased pulmonary edema from previous.    Diagnoses of CHF exacerbation, fluid overload, COPD exacerbation, ACS, PE, pneumonia were considered.  Based on history, vitals, exam, and work-up thus far, believe the patient is most likely developing volume overload in the setting of recently being taken off of her torsemide and spironolactone during a recent admission earlier this month.  She is not hypoxic and is without signs of respiratory distress.  Do not believe she requires admission.  Will provide 40 mg dose of IV Lasix here in the ED for diuresis.  Although the patient's hemoglobin is 7.9, in the setting of likely fluid overload, believe the risks of blood transfusion outweigh the benefits despite the fact that her transfusion goal is probably hemoglobin of 8.  Discussed this with the patient, who is okay with foregoing transfusion today.    BP 123/63  - Pulse 65  - Temp 36.8 ??C (98.3 ??F)  - Resp 19  - Ht 165.1 cm (5' 5)  - Wt 98 kg (216 lb)  - SpO2 95%  - BMI 35.94 kg/m??     Diagnostic orders as below.    Orders Placed This Encounter   Procedures   ??? XR Chest 1 view Portable   ??? hsTroponin I (serial 0-2-6H w/ delta)   ??? Comprehensive Metabolic Panel   ??? CBC w/ Differential   ??? B-type natriuretic peptide   ??? ECG 12 Lead   ??? ECG 12 Lead   ??? ECG 12 Lead       ED Course:     Discussed overall reassuring evaluation and work-up with the patient.  It seems as if she was previously taken off diuretics given slightly worsening renal function with concurrent concern for dehydration.  Believe the patient would benefit from reinitiating her diuretics.  Advised her to continue taking her torsemide and spironolactone as she previously was.  She has a follow-up appointment with her cardiologist in 1 week.  Advised her to follow-up with her primary care provider sooner if possible for reevaluation and further discussion of diuretic dosing.  Patient and daughter are agreeable to this plan and amenable to discharge at this time.  Strict return precautions provided.    _____________________________________________________________________    The case was  discussed with attending physician who is in agreement with the above assessment and plan    Additional Medical Decision Making     I have reviewed the vital signs and the nursing notes. Labs and radiology results that were available during my care of the patient were independently reviewed by me and considered in my medical decision making.   I independently visualized the EKG tracing if performed  I independently visualized the radiology images if performed  I reviewed the patient's prior medical records if available.  Additional history obtained from family if available    History     CHIEF COMPLAINT:   Chief Complaint   Patient presents with   ??? Shortness of Breath       HPI: Barbara Huber is a 80 y.o. female with a past medical history of COPD (on 3 L nasal cannula at home), pulmonary hypertension, amyloidosis, CHF, chronic anemia, chronic atrial fibrillation, renal stones, diabetes who presents the emergency department today for evaluation of several days of lower extremity edema and dyspnea on exertion. In early June the patient was admitted for several days for fatigue and dyspnea thought to be due to previous COVID-19 infection.  During this admission, her torsemide and spironolactone were discontinued due to concern for worsening renal function and concurrent dehydration.  She states that several days after discharge, she developed bilateral leg swelling and has had dyspnea on exertion over the last 2 days.  Due to her symptoms, she reinitiated her torsemide at a half dose 2-3 days ago.  Despite this, her symptoms continued to slightly worsen and she decided to present to the emergency department.  She denies any chest pain, fever, chills, falls, syncope, lightheadedness, abdominal pain, nausea, vomiting, diarrhea, constipation, rash, urinary issues.  She states she has otherwise been taking her medications as prescribed.    PAST MEDICAL HISTORY/PAST SURGICAL HISTORY:   Past Medical History:   Diagnosis Date   ??? Acute kidney injury superimposed on chronic kidney disease (CMS-HCC) 10/11/2020   ??? Acute on chronic diastolic (congestive) heart failure (CMS-HCC) 08/23/2020   ??? Arthritis    ??? Calculus of kidney    ??? Calculus of ureter    ??? CHF (congestive heart failure) (CMS-HCC)    ??? Chronic atrial fibrillation (CMS-HCC) 07/20/2019   ??? COPD (chronic obstructive pulmonary disease) (CMS-HCC)    ??? Diabetes (CMS-HCC)    ??? Gangrenous cholecystitis 10/11/2020   ??? GERD (gastroesophageal reflux disease)    ??? Hydronephrosis    ??? Hypertension    ??? Hyponatremia 10/11/2020   ??? Intermediate coronary syndrome (CMS-HCC) 03/13/2014   ??? Lower extremity edema 09/28/2020   ??? Lumbar stenosis    ??? Microscopic hematuria    ??? Nausea alone    ??? Nephrolithiasis 04/17/2016   ??? Neuropathy    ??? Nocturia    ??? Other chronic cystitis    ??? Pulmonary hypertension (CMS-HCC)    ??? Renal colic    ??? Sleep apnea    ??? Unstable angina pectoris (CMS-HCC) 03/13/2014       Past Surgical History:   Procedure Laterality Date   ??? BACK SURGERY  1995   ??? CARPAL TUNNEL RELEASE Left 2014   ??? HYSTERECTOMY  1971   ??? IR INSERT CHOLECYSTOSMY TUBE PERCUTANEOUS  10/02/2020    IR INSERT CHOLECYSTOSMY TUBE PERCUTANEOUS 10/02/2020 Braulio Conte, MD IMG VIR H&V [facility]   ??? LUMBAR DISC SURGERY     ??? PR REMOVAL GALLBLADDER N/A 10/06/2020    Procedure:  CHOLECYSTECTOMY;  Surgeon: Katherina Mires, MD;  Location: MAIN OR [facility];  Service: Trauma   ??? PR RIGHT HEART CATH O2 SATURATION & CARDIAC OUTPUT N/A 09/30/2020    Procedure: Right Heart Catheterization;  Surgeon: Neal Dy, MD;  Location: [facility] CATH;  Service: Cardiology   ??? PR RIGHT HEART CATH O2 SATURATION & CARDIAC OUTPUT N/A 01/09/2021    Procedure: Right Heart Catheterization;  Surgeon: Lesle Reek, MD;  Location: [facility] CATH;  Service: Cardiology       MEDICATIONS:   No current facility-administered medications for this encounter.    Current Outpatient Medications:   ???  ACCU-CHEK AVIVA PLUS TEST STRP Strp, USE TO CHECK BLOOD SUGAR 3 TIMES A DAY BEFORE MEALS, Disp: , Rfl:   ???  acetaminophen (TYLENOL) 500 MG tablet, Take 1,000 mg by mouth daily as needed for pain. , Disp: , Rfl:   ???  albuterol HFA 90 mcg/actuation inhaler, Inhale 2 puffs every eight (8) hours as needed for wheezing., Disp: , Rfl:   ???  amiodarone (PACERONE) 200 MG tablet, Take 1 tablet (200 mg total) by mouth daily., Disp: 30 tablet, Rfl: 1  ???  apixaban (ELIQUIS) 5 mg Tab, Take 1 tablet (5 mg total) by mouth Two (2) times a day., Disp: 60 tablet, Rfl: 11  ???  estradioL (ESTRACE) 0.01 % (0.1 mg/gram) vaginal cream, INSERT ONE GRAM VAGINALLY AT BEDTIME, Disp: , Rfl:   ???  ferrous sulfate 325 (65 FE) MG tablet, Take 1 tablet (325 mg total) by mouth every other day., Disp: 30 tablet, Rfl: 3  ???  fluticasone propion-salmeteroL (ADVAIR HFA) 115-21 mcg/actuation inhaler, Inhale 2 puffs Two (2) times a day., Disp: 12 g, Rfl: 11  ???  fosfomycin (MONUROL) 3 gram Pack, Take 3 g by mouth once a week for 12 doses., Disp: 36 g, Rfl: 0  ???  gabapentin (NEURONTIN) 300 MG capsule, Take 1 capsule (300 mg total) by mouth Three (3) times a day., Disp: 90 capsule, Rfl: 0  ???  lancets Misc, 1 each by Miscellaneous route daily. Accu Check, Disp: 100 each, Rfl: 6  ???  montelukast (SINGULAIR) 10 mg tablet, Take 1 tablet (10 mg total) by mouth daily., Disp: 90 tablet, Rfl: 3  ???  NARCAN 4 mg/actuation nasal spray, 1 spray into alternating nostrils once as needed (opioid overdose)., Disp: , Rfl:   ???  oxyCODONE (OXYCONTIN) 10 mg TR12 12 hr crush resistant ER/CR tablet, Take 10 mg by mouth every twelve (12) hours., Disp: , Rfl:   ???  OXYGEN-AIR DELIVERY SYSTEMS MISC, 5 L by Miscellaneous route., Disp: , Rfl:   ???  rosuvastatin (CRESTOR) 5 MG tablet, Take 1 tablet (5 mg total) by mouth every other day., Disp: 3015 tablet, Rfl: 11  ???  [START ON 06/18/2021] spironolactone (ALDACTONE) 50 MG tablet, Take 1 tablet (50 mg total) by mouth in the morning., Disp: 30 tablet, Rfl: 0  ???  tafamidis 61 mg cap, Take 1 capsule (61 mg) by mouth daily., Disp: 30 capsule, Rfl: 11  ???  tiotropium bromide (SPIRIVA RESPIMAT) 2.5 mcg/actuation inhalation mist, Inhale 2 puffs daily., Disp: 4 g, Rfl: 11  ???  [START ON 06/18/2021] torsemide (DEMADEX) 20 MG tablet, Take 1 tablet (20 mg total) by mouth daily. May also take 1 tablet (20 mg total) daily as needed (weight gain)., Disp: 60 tablet, Rfl: 11  ???  varicella-zoster gE-AS01B, PF, (SHINGRIX, PF,) 50 mcg/0.5 mL SusR injection, Inject 0.5 mL into the muscle., Disp: 0.5 mL,  Rfl: 1    ALLERGIES:   Nitrofurantoin and Lipitor [atorvastatin]    SOCIAL HISTORY:   Social History     Tobacco Use   ??? Smoking status: Former Smoker     Packs/day: 0.33     Types: Cigarettes   ??? Smokeless tobacco: Never Used   ??? Tobacco comment: Quit a few years ago   Substance Use Topics   ??? Alcohol use: No       FAMILY HISTORY:  Family History   Problem Relation Age of Onset   ??? Hypertension Mother    ??? Cancer Father         COLON CANCER   ??? Anesthesia problems Neg Hx    ??? Broken bones Neg Hx    ??? Clotting disorder Neg Hx    ??? Collagen disease Neg Hx    ??? Diabetes Neg Hx    ??? Dislocations Neg Hx    ??? Fibromyalgia Neg Hx    ??? Gout Neg Hx    ??? Hemophilia Neg Hx    ??? Osteoporosis Neg Hx    ??? Rheumatologic disease Neg Hx    ??? Scoliosis Neg Hx    ??? Severe sprains Neg Hx    ??? Sickle cell anemia Neg Hx    ??? Spinal Compression Fracture Neg Hx    ??? GU problems Neg Hx    ??? Kidney cancer Neg Hx    ??? Prostate cancer Neg Hx           Review of Systems  A 10 point review of systems was performed and is negative other than positive elements noted in HPI     Physical Exam     VITAL SIGNS:    BP 123/63  - Pulse 65  - Temp 36.8 ??C (98.3 ??F)  - Resp 19  - Ht 165.1 cm (5' 5)  - Wt 98 kg (216 lb)  - SpO2 95%  - BMI 35.94 kg/m??     Constitutional: Alert and oriented.  Chronically ill-appearing but in no acute distress.  Resting comfortably in bed.  Eyes: Conjunctivae are normal.  ENT       Head: Normocephalic and atraumatic.       Nose: No congestion.       Mouth/Throat: Mucous membranes are moist.       Neck: No stridor.  Cardiovascular: S1, S2,  Normal and symmetric distal pulses are present in all extremities. Warm and well perfused.  Respiratory: Normal respiratory effort.  Faint crackles in the bilateral bases.  No wheezing.  Gastrointestinal: Soft and nontender, nondistended.  Musculoskeletal: Normal range of motion in all extremities.  She has 1+ pitting edema that is equal in the bilateral lower extremities to proximal shins.  Neurologic: Normal speech and language. Alert and oriented x3. No gross focal neurologic deficits are appreciated.  Skin: Skin is warm, dry and intact. No rash noted.  Psychiatric: Mood and affect are normal. Speech and behavior are normal.    Radiology     XR Chest 1 view Portable   Final Result      Similar appearance of cardiomegaly and interstitial thickening and mid lower lung zones most consistent with pulmonary edema, though infection/inflammation cannot be entirely excluded.      ====================   ADDENDUM (06/13/2021 8:35 AM):    On attending review, the following additional findings/changes were noted:      Slightly increased pulmonary edema. Increased bibasilar consolidation, likely also edema versus atelectasis or other airspace  disease.          Labs     Labs Reviewed   HIGH SENSITIVITY TROPONIN I - SERIAL - Abnormal; Notable for the following components:       Result Value    hsTroponin I 42 (*)     All other components within normal limits   COMPREHENSIVE METABOLIC PANEL - Abnormal; Notable for the following components:    CO2 31.4 (*)     BUN 34 (*)     Creatinine 1.90 (*)     eGFR [name]-EPI (2021) Female 27 (*)     ALT <7 (*)     All other components within normal limits   B-TYPE NATRIURETIC PEPTIDE - Abnormal; Notable for the following components:    BNP 504.58 (*)     All other components within normal limits   CBC W/ AUTO DIFF - Abnormal; Notable for the following components:    RBC 3.39 (*)     HGB 7.9 (*)     HCT 26.6 (*)     MCH 23.4 (*)     MCHC 29.8 (*)     RDW 20.6 (*)     Absolute Basophils 0.2 (*)     Anisocytosis Marked (*)     All other components within normal limits   SLIDE REVIEW - Abnormal; Notable for the following components:    Smear Review Comments See Comment (*)     Polychromasia Slight (*)     All other components within normal limits    Narrative:     Large Platelets Present   CBC W/ DIFFERENTIAL    Narrative:     The following orders were created for panel order CBC w/ Differential.                  Procedure Abnormality         Status                                     ---------                               -----------         ------                                     CBC w/ Differential[[id number]]         Abnormal            Final result                               Morphology Review[[id number]]           Abnormal            Final result                                                 Please view results for these tests on the individual orders.     Pertinent labs & imaging results that were available during my care of the patient were reviewed by me and considered in  my medical decision making (see chart for details).    Please note- This chart has been created using [employer]. Chart creation errors have been sought, but may not always be located and such creation errors, especially pronoun confusion, do NOT reflect on the standard of medical care.    Konrad Penta, MD  EM PGY-1     Konrad Penta, MD  Resident  06/14/21 [id number]

## 2021-06-17 ENCOUNTER — Ambulatory Visit: Admit: 2021-06-17 | Discharge: 2021-06-17 | Payer: MEDICARE

## 2021-06-17 DIAGNOSIS — N1832 Type 2 diabetes mellitus with stage 3b chronic kidney disease, without long-term current use of insulin (CMS-HCC): Principal | ICD-10-CM

## 2021-06-17 DIAGNOSIS — D500 Iron deficiency anemia secondary to blood loss (chronic): Principal | ICD-10-CM

## 2021-06-17 DIAGNOSIS — I43 Cardiomyopathy in diseases classified elsewhere: Principal | ICD-10-CM

## 2021-06-17 DIAGNOSIS — E854 Organ-limited amyloidosis: Principal | ICD-10-CM

## 2021-06-17 DIAGNOSIS — I10 Essential (primary) hypertension: Principal | ICD-10-CM

## 2021-06-17 DIAGNOSIS — E1122 Type 2 diabetes mellitus with diabetic chronic kidney disease: Principal | ICD-10-CM

## 2021-06-17 DIAGNOSIS — N179 Acute kidney failure, unspecified: Principal | ICD-10-CM

## 2021-06-17 DIAGNOSIS — R5383 Other fatigue: Principal | ICD-10-CM

## 2021-06-17 DIAGNOSIS — D509 Iron deficiency anemia, unspecified: Principal | ICD-10-CM

## 2021-06-17 DIAGNOSIS — I482 Chronic atrial fibrillation, unspecified: Principal | ICD-10-CM

## 2021-06-17 DIAGNOSIS — R1013 Epigastric pain: Principal | ICD-10-CM

## 2021-06-17 DIAGNOSIS — R601 Generalized edema: Principal | ICD-10-CM

## 2021-06-17 DIAGNOSIS — B948 Sequelae of other specified infectious and parasitic diseases: Principal | ICD-10-CM

## 2021-06-17 LAB — CBC W/ AUTO DIFF
BASOPHILS ABSOLUTE COUNT: 0.1 10*9/L (ref 0.0–0.1)
BASOPHILS RELATIVE PERCENT: 1.3 %
EOSINOPHILS ABSOLUTE COUNT: 0.2 10*9/L (ref 0.0–0.5)
EOSINOPHILS RELATIVE PERCENT: 2.8 %
HEMATOCRIT: 24.1 % — ABNORMAL LOW (ref 34.0–44.0)
HEMOGLOBIN: 7.5 g/dL — ABNORMAL LOW (ref 11.3–14.9)
LYMPHOCYTES ABSOLUTE COUNT: 1.1 10*9/L (ref 1.1–3.6)
LYMPHOCYTES RELATIVE PERCENT: 15.8 %
MEAN CORPUSCULAR HEMOGLOBIN CONC: 31.1 g/dL — ABNORMAL LOW (ref 32.0–36.0)
MEAN CORPUSCULAR HEMOGLOBIN: 23.8 pg — ABNORMAL LOW (ref 25.9–32.4)
MEAN CORPUSCULAR VOLUME: 76.4 fL — ABNORMAL LOW (ref 77.6–95.7)
MEAN PLATELET VOLUME: 8 fL (ref 6.8–10.7)
MONOCYTES ABSOLUTE COUNT: 0.8 10*9/L (ref 0.3–0.8)
MONOCYTES RELATIVE PERCENT: 10.8 %
NEUTROPHILS ABSOLUTE COUNT: 4.9 10*9/L (ref 1.8–7.8)
NEUTROPHILS RELATIVE PERCENT: 69.3 %
PLATELET COUNT: 219 10*9/L (ref 150–450)
RED BLOOD CELL COUNT: 3.15 10*12/L — ABNORMAL LOW (ref 3.95–5.13)
RED CELL DISTRIBUTION WIDTH: 20.7 % — ABNORMAL HIGH (ref 12.2–15.2)
WBC ADJUSTED: 7 10*9/L (ref 3.6–11.2)

## 2021-06-17 LAB — BASIC METABOLIC PANEL
ANION GAP: 7 mmol/L (ref 5–14)
BLOOD UREA NITROGEN: 29 mg/dL — ABNORMAL HIGH (ref 9–23)
BUN / CREAT RATIO: 13
CALCIUM: 9.1 mg/dL (ref 8.7–10.4)
CHLORIDE: 101 mmol/L (ref 98–107)
CO2: 31.1 mmol/L — ABNORMAL HIGH (ref 20.0–31.0)
CREATININE: 2.21 mg/dL — ABNORMAL HIGH
EGFR CKD-EPI (2021) FEMALE: 22 mL/min/{1.73_m2} — ABNORMAL LOW (ref >=60)
GLUCOSE RANDOM: 95 mg/dL (ref 70–179)
POTASSIUM: 4.9 mmol/L — ABNORMAL HIGH (ref 3.4–4.8)
SODIUM: 139 mmol/L (ref 135–145)

## 2021-06-17 LAB — IRON PANEL
IRON: <20 ug/dL — ABNORMAL LOW
TOTAL IRON BINDING CAPACITY: 368 ug/dL (ref 250–425)

## 2021-06-17 LAB — FERRITIN: FERRITIN: 9.8 ng/mL

## 2021-06-17 LAB — MAGNESIUM: MAGNESIUM: 3.6 mg/dL — ABNORMAL HIGH (ref 1.6–2.6)

## 2021-06-17 LAB — SLIDE REVIEW

## 2021-06-17 MED ORDER — PANTOPRAZOLE 20 MG TABLET,DELAYED RELEASE
ORAL_TABLET | Freq: Every day | ORAL | 5 refills | 60.00000 days | Status: CP
Start: 2021-06-17 — End: 2022-06-12

## 2021-06-17 NOTE — Unmapped (Signed)
Internal Medicine Clinic Visit    Reason for visit: Follow up    A/P:    AKI (acute kidney injury) (CMS-HCC)  Lab Results   Component Value Date    CREATININE 1.90 (H) 06/12/2021     Had a bump in her creatinine when she was taking her diuretics every day.  She is currently taking 40 mg daily of torsemide and 50 mg of spironolactone.  Her volume status is fragile.  Previously when she stopped her diuretic she becomes short of breath.  Plan: We will check her BMP today.  We will likely have to go to 40 mg every other day of torsemide.  Iron deficiency anemia due to chronic blood loss  Lab Results   Component Value Date    FERRITIN 9.8 06/03/2021     Lab Results   Component Value Date    WBC 6.8 06/12/2021    RBC 3.39 (L) 06/12/2021    HGB 7.9 (L) 06/12/2021    HCT 26.6 (L) 06/12/2021    MCV 78.4 06/12/2021    MCH 23.4 (L) 06/12/2021    MCHC 29.8 (L) 06/12/2021    RDW 20.6 (H) 06/12/2021    PLT 217 06/12/2021    MPV 7.8 06/12/2021     Patient's has iron deficiency anemia.  No GI symptoms.  Is on Eliquis.  Hemoglobin has been relatively stable.  Plan:  ?? Check repeat CBC today  ?? Try to move up iron infusions, which are currently scheduled for July 19  ?? If hemoglobin less than 7.5 will send to ED for transfusion  ?? Patient declines GI work-up at this point  ?? Will start pantoprazole 20 mg twice daily empirically      Cardiac amyloidosis (CMS-HCC)  Diagnosed 10/2020 on NM Spect. Followed at [facility] cardiology  On tafamidis    Essential hypertension  History of hypertension, now stable.   On spirinolactone 50 mg every day.Torsemide-variable dosing  BP: (P) 101/46      Plan: continue treatment-may need reduction in spirinolactone      Dyspepsia:  Patient reports some dyspepsia.  Has iron deficiency anemia.  Plan: Start pantoprazole 20 mg twice daily      Generalized edema   Patient has her stable edema.  We will check BNP to see her progress.    Type 2 diabetes mellitus with stage 3b chronic kidney disease, without long-term current use of insulin (CMS-HCC)  Overview:  Lab Results   Component Value Date    A1C <3.8 (L) 06/02/2021    A1C 5.5 04/04/2021    A1C 7.4 (H) 12/12/2020     Diabetes, on metformin 500 mg bid; metformin has been stopped because of a decrease in renal function.  However family restarted it due to elevated blood sugars.  We will see what her creatinine is today and whether or not she can stay on the metformin-if her GFR is greater than 30.  If her GFR is less than 30, will start Sitagliptin  Rx changes: None, well controlled as above   On statin. Pt is not on ARB -ARB Stopped secondary to hypotension        Persistent fatigue after COVID-19  Overview:  Patient with some fatigue.  See plans for anemia, AKI  We are also tapering her gabapentin.  Currently on 300 mg 3 times daily.  We will decrease it to twice daily, and then nightly.  She states she is not having any recurrence of her pain.  Health Maintenance  Health Maintenance Due   Topic Date Due   ??? Retinal Eye Exam  Never done   ??? DTaP/Tdap/Td Vaccines (1 - Tdap) Never done   ??? DEXA Scan-Start Age 27  Never done   ??? COVID-19 Vaccine (4 - Booster for Pfizer series) 04/12/2021   ??? Zoster Vaccines (2 of 2) 05/30/2021        Return for Next scheduled follow up.  Next Visit:   ???     HPI:Pt is a 80 y.o. female with a history of cardiac amyloidosis with heart failure, afib, [name], anemia, COPD on home oxygen, DM, frequent UTI.  Seen with daughter Claudie Fisherman equal  Has been having anemia, [name],   Last labs: 6/16: Hb 7.9.   In brief: Creatinine was found to be elevated on 6 1, and then torsemide decreased, then developed shortness of breath.  Seen in ED 06/03/2021.  Received 1 unit of blood and Lasix and was hospitalized.  On discharge, torsemide and spironolactone were held.  Went to ED 6/16 with shortness of breath.  Received IV Lasix.  Advised to resume torsemide and spironolactone.  Daughter says that they had actually already started to resume those medications.   Now taking torsemide 40 mg every day, spironolactone 50 mg.   Is back on metformin: 500 mg bid. Blood sugars were up to 200.     Gets iron July 19th.   Feels like she has no get up and go  Problem List:  Patient Active Problem List   Diagnosis   ??? Spinal stenosis of lumbar region   ??? Thoracic or lumbosacral neuritis or radiculitis   ??? Lumbosacral spondylosis   ??? COPD (chronic obstructive pulmonary disease) (CMS-HCC)   ??? Sleep apnea in adult   ??? Essential hypertension   ??? AKI (acute kidney injury) (CMS-HCC)   ??? Type 2 diabetes mellitus with stage 3b chronic kidney disease, without long-term current use of insulin (CMS-HCC)   ??? Chronic cystitis   ??? Incomplete bladder emptying   ??? Nocturia   ??? Peripheral neuropathy   ??? Chronic respiratory failure with hypoxia (CMS-HCC)   ??? Anemia   ??? Persistent atrial fibrillation (CMS-HCC)   ??? Anemia in chronic kidney disease   ??? Chronic kidney disease, stage III (moderate) (CMS-HCC)   ??? Enrolled in chronic care management   ??? Pulmonary hypertension (CMS-HCC)   ??? Shortness of breath   ??? Chronic prescription opiate use   ??? Dyspnea on exertion   ??? 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 Cardiac amyloidosis (CMS-HCC)   ??? Recurrent cold sores   ??? Persistent fatigue after COVID-19   ??? Dyspepsia   ??? Iron deficiency anemia due to chronic blood loss   ??? Generalized edema        Medications:  Reviewed in EPIC  Soc History and Family History reviewed in Epic    Physical Exam:   Vital Signs:  Vitals:    06/17/21 1115   BP: (P) 101/46   BP Site: (P) L Arm   BP Position: (P) Sitting   Pulse: (P) 68   Resp: 16   Temp: 37.1 ??C (98.7 ??F)   TempSrc: Oral   SpO2: 96%   Weight: 97.5 kg (215 lb)   Height: 162.6 cm (5' 4)     Wt Readings from Last 3 Encounters:   06/17/21 97.5 kg (215 lb)   06/12/21 98 kg (216 lb)   06/03/21 96.2 kg (212 lb)  Gen: Well appearing, NAD; well colored  CV: RRR, no murmurs  Pulm: CTA bilaterally, no crackles or wheezes  Ext:1- 2+ edema up to the knee  Records review  Lab Results   Component Value Date    CREATININE 1.90 (H) 06/12/2021    CHOL 132 08/23/2020    HDL 32 (L) 08/23/2020    LDL 78 08/23/2020    NONHDL 100 08/23/2020    TRIG 109 08/23/2020    A1C <3.8 (L) 06/02/2021           The 10-year ASCVD risk score Denman George DC Jr., et al., 2013) is: 18.8%     Medication adherence and barriers to the treatment plan have been addressed. Opportunities to optimize healthy behaviors have been discussed. Patient / caregiver voiced understanding.    I personally spent 50 minutes face-to-face and non-face-to-face in the care of this patient, which includes all pre, intra, and post visit time on the date of service.

## 2021-06-17 NOTE — Unmapped (Signed)
I will try to move up iron appointment

## 2021-06-17 NOTE — Unmapped (Signed)
[facility] at [facility]       Type of visit:  face to face    Reason for visit: F/U    Questions / Concerns that need to be addressed: ER F/U , pt would like to discuss stomach issues and itching    General Consent to Treat (GCT) for non-epic video visits only:           Screening BP- 101/46   68       Allergies reviewed: Yes    Medication reviewed: Yes  Pended refills? No        HCDM reviewed and updated in Epic:    We are working to make sure all of our patients??? wishes are updated in Epic and part of that is documenting a Environmental health practitioner for each patient  A Health Care Decision Maker is someone you choose who can make health care decisions for you if you are not able - who would you most want to do this for you????  is already up to date.        BPAs completed:        COVID Vaccination:  If Care Gap for COVID vaccine is present:  Have you received a COVID vaccine? yes  If yes: How many doses have you received? 3   Type of Vaccine received? Pfizer   Date of 1st dose:    Date of 2nd dose:    If no: Are you interested in scheduling?         __________________________________________________________________________________________    SCREENINGS COMPLETED IN FLOWSHEETS    HARK Screening       AUDIT       PHQ2       PHQ9          P4 Suicidality Screener                GAD7       COPD Assessment

## 2021-06-18 ENCOUNTER — Ambulatory Visit
Admit: 2021-06-18 | Discharge: 2021-06-18 | Disposition: A | Discharge status: Home / Self Care | Payer: MEDICARE | Attending: Emergency Medicine

## 2021-06-18 DIAGNOSIS — Z79899 Other long term (current) drug therapy: Principal | ICD-10-CM

## 2021-06-18 DIAGNOSIS — D500 Iron deficiency anemia secondary to blood loss (chronic): Principal | ICD-10-CM

## 2021-06-18 DIAGNOSIS — E1122 Type 2 diabetes mellitus with diabetic chronic kidney disease: Principal | ICD-10-CM

## 2021-06-18 DIAGNOSIS — D509 Iron deficiency anemia, unspecified: Principal | ICD-10-CM

## 2021-06-18 DIAGNOSIS — N184 Chronic kidney disease, stage 4 (severe): Principal | ICD-10-CM

## 2021-06-18 DIAGNOSIS — N179 Acute kidney failure, unspecified: Principal | ICD-10-CM

## 2021-06-18 DIAGNOSIS — N1832 Type 2 diabetes mellitus with stage 3b chronic kidney disease, without long-term current use of insulin (CMS-HCC): Principal | ICD-10-CM

## 2021-06-18 DIAGNOSIS — N189 Chronic kidney disease, unspecified: Principal | ICD-10-CM

## 2021-06-18 LAB — CBC W/ AUTO DIFF
BASOPHILS ABSOLUTE COUNT: 0 10*9/L (ref 0.0–0.1)
BASOPHILS RELATIVE PERCENT: 0.5 %
EOSINOPHILS ABSOLUTE COUNT: 0.3 10*9/L (ref 0.0–0.5)
EOSINOPHILS RELATIVE PERCENT: 3.2 %
HEMATOCRIT: 24.5 % — ABNORMAL LOW (ref 34.0–44.0)
HEMOGLOBIN: 7.5 g/dL — ABNORMAL LOW (ref 11.3–14.9)
LYMPHOCYTES ABSOLUTE COUNT: 1.2 10*9/L (ref 1.1–3.6)
LYMPHOCYTES RELATIVE PERCENT: 15.3 %
MEAN CORPUSCULAR HEMOGLOBIN CONC: 30.5 g/dL — ABNORMAL LOW (ref 32.0–36.0)
MEAN CORPUSCULAR HEMOGLOBIN: 23.3 pg — ABNORMAL LOW (ref 25.9–32.4)
MEAN CORPUSCULAR VOLUME: 76.5 fL — ABNORMAL LOW (ref 77.6–95.7)
MEAN PLATELET VOLUME: 8 fL (ref 6.8–10.7)
MONOCYTES ABSOLUTE COUNT: 0.9 10*9/L — ABNORMAL HIGH (ref 0.3–0.8)
MONOCYTES RELATIVE PERCENT: 11.1 %
NEUTROPHILS ABSOLUTE COUNT: 5.6 10*9/L (ref 1.8–7.8)
NEUTROPHILS RELATIVE PERCENT: 69.9 %
NUCLEATED RED BLOOD CELLS: 0 /100{WBCs} (ref <=4)
PLATELET COUNT: 241 10*9/L (ref 150–450)
RED BLOOD CELL COUNT: 3.2 10*12/L — ABNORMAL LOW (ref 3.95–5.13)
RED CELL DISTRIBUTION WIDTH: 20.8 % — ABNORMAL HIGH (ref 12.2–15.2)
WBC ADJUSTED: 8 10*9/L (ref 3.6–11.2)

## 2021-06-18 LAB — URINALYSIS WITH CULTURE REFLEX
BILIRUBIN UA: NEGATIVE
BLOOD UA: NEGATIVE
GLUCOSE UA: NEGATIVE
KETONES UA: NEGATIVE
NITRITE UA: NEGATIVE
PH UA: 7.5 (ref 5.0–9.0)
PROTEIN UA: NEGATIVE
RBC UA: 3 /HPF (ref <4)
SPECIFIC GRAVITY UA: 1.015 (ref 1.005–1.040)
SQUAMOUS EPITHELIAL: 22 /HPF — ABNORMAL HIGH (ref 0–5)
UROBILINOGEN UA: 0.2
WBC UA: 4 /HPF (ref 0–5)

## 2021-06-18 LAB — TSH: THYROID STIMULATING HORMONE: 2.122 u[IU]/mL (ref 0.550–4.780)

## 2021-06-18 LAB — COMPREHENSIVE METABOLIC PANEL
ALBUMIN: 3.8 g/dL (ref 3.4–5.0)
ALKALINE PHOSPHATASE: 48 U/L (ref 46–116)
ALT (SGPT): <7 U/L — ABNORMAL LOW (ref 10–49)
ANION GAP: 8 mmol/L (ref 5–14)
AST (SGOT): 16 U/L (ref <=34)
BILIRUBIN TOTAL: 0.4 mg/dL (ref 0.3–1.2)
BLOOD UREA NITROGEN: 36 mg/dL — ABNORMAL HIGH (ref 9–23)
BUN / CREAT RATIO: 15
CALCIUM: 8.9 mg/dL (ref 8.7–10.4)
CHLORIDE: 98 mmol/L (ref 98–107)
CO2: 30.8 mmol/L (ref 20.0–31.0)
CREATININE: 2.47 mg/dL — ABNORMAL HIGH
EGFR CKD-EPI (2021) FEMALE: 19 mL/min/{1.73_m2} — ABNORMAL LOW (ref >=60)
GLUCOSE RANDOM: 119 mg/dL (ref 70–179)
POTASSIUM: 5.3 mmol/L — ABNORMAL HIGH (ref 3.4–4.8)
PROTEIN TOTAL: 7.3 g/dL (ref 5.7–8.2)
SODIUM: 137 mmol/L (ref 135–145)

## 2021-06-18 LAB — PROTIME-INR
INR: 2.46
PROTIME: 28.5 s — ABNORMAL HIGH (ref 10.3–13.4)

## 2021-06-18 MED ORDER — SITAGLIPTIN 25 MG TABLET
ORAL_TABLET | Freq: Every day | ORAL | 11 refills | 30.00000 days | Status: CP
Start: 2021-06-18 — End: 2022-06-18

## 2021-06-18 MED ORDER — SPIRONOLACTONE 50 MG TABLET
ORAL_TABLET | Freq: Every day | ORAL | 0 refills | 30 days
Start: 2021-06-18 — End: 2021-07-18

## 2021-06-18 MED ORDER — TORSEMIDE 20 MG TABLET
ORAL_TABLET | ORAL | 11 refills | 0 days
Start: 2021-06-18 — End: 2021-07-18

## 2021-06-18 MED ADMIN — furosemide (LASIX) injection 20 mg: 20 mg | INTRAVENOUS | @ 19:00:00 | Stop: 2021-06-18

## 2021-06-18 NOTE — Unmapped (Signed)
Patient with heart failure, [name], iron deficiency anemia.  Hemoglobin at 7.5.  Iron transfusion scheduled for July.  I have advised patient to go to [facility] emergency room for 1 unit of PRBCs; at providers discretion may need some IV Lasix with that.  I have also advised them to change the torsemide to every other day and the spironolactone to 25 mg daily, given her creatinine rise

## 2021-06-18 NOTE — Unmapped (Signed)
[facility]  Emergency Department Provider Note    ED Clinical Impression     Final diagnoses:   Iron deficiency anemia, unspecified iron deficiency anemia type (Primary)   Acute kidney injury superimposed on chronic kidney disease (CMS-HCC)       Initial Impression, ED Course, Assessment and Plan     Impression: * 80 y.o. female with past medical history of iron deficiency anemia, acute on chronic diastolic heart failure, [name], A. fib, COPD, diabetes, GERD, hypertension, pulmonary hypertension is presenting today for evaluation of anemia and generalized weakness.VS within normal limits. On chronic 2 L O2, clear breath sounds bilaterally, no lower extremity edema. Rectal with light brown stool, hemoccult +. Suspect patient's weakness from signfiicantly low iron, given she has cardiac history I think it would be reasonable to transfuse her 1unit PRBC to get hgb >8. Considered weakness from increasing kidney function, however given it appears to not be significantly acutely increased, and patient has very low iron, feel this is less likely. Given no significant SOB, increased O2 requirement, appears euvolemic, suspect unlikely HF exacerbation. TSH yesterday was normal, so do not feel thyroid as cause of fatigue. Will obtain UA to r/o UTI, though unlikely given no UTI symptoms. Plan to recheck labs and transfuse 1 unit PRBC. Will give small dose of lasix with transfusion given history of heart failure.    ED Course as of 06/18/21 2120   Wed Jun 18, 2021   1335 INR: 2.46  Suspect secondary to eliquis   1336 Creatinine(!): 2.47  Increased from 2.21 yesterday.   1516 I sent a message to patient's PCP to make them aware of patient receiving transfusion and increasing creatinine.    1637 Discussed with patient that her kidney function increased as well as potassium is slightly elevated.  Also discussed her INR results.  Discussed with her the importance of getting repeat blood work in about 2 days.  We will also send another message to her PCP with plans for discharge in hopes that she can get repeat labs in 2 days.  Patient would like to go home which I think is reasonable at this time.  I discussed with her strict return precautions.  She was breathing well and denied any shortness of breath following blood transfusion.         Additional Medical Decision Making     I have reviewed the vital signs and the nursing notes. Labs and radiology results that were available during my care of the patient were independently reviewed by me and considered in my medical decision making.     I staffed the case with the ED attending, Dr. Micael Hampshire.    I independently visualized the EKG tracing.   I independently visualized the radiology images.   I reviewed the patient's prior medical records that were available for viewing.     Portions of this record have been created using Scientist, clinical (histocompatibility and immunogenetics). Dictation errors have been sought, but may not have been identified and corrected.  ____________________________________________       History     Chief Complaint  Anemia      HPI   Barbara Huber is a 80 y.o. female with past medical history of iron deficiency anemia, acute on chronic diastolic heart failure, [name], A. fib, COPD, diabetes, GERD, hypertension, pulmonary hypertension on 2 L O2 is presenting today for evaluation of anemia and generalized weakness. Is schedule for iron transfusion in July. However, PCP sent here in for worsening  fatigue over the last 1 month. She denies any rectal bleeding or black stools. Not taking oral iron due to GI intolerance. Reports feeling more weak when doing activities. But not more SOB, denies chest pain. No dysuria. PCP labs done yesterday showed increasing creatinine. Patient was instructed to change toresmide to 40 mg every other day rather than daily, also instructed to stop metformin, patient was sent a [place] rx. Also instructed to change spironolactone 25 mg instead of 50 mg daily.    Patient hgb 8.1 one month ago. Iron panel sent yesterday showed low iron levels <20. Creatinine was 2.59 on 6/6/    Patient had COVID diagnosed on 06-03-2021. Reports she felt asymptomatic at that time. Denies any  current cough, fever, congestion, sore throat.      Past Medical History:   Diagnosis Date   ??? Acute kidney injury superimposed on chronic kidney disease (CMS-HCC) 10/11/2020   ??? Acute on chronic diastolic (congestive) heart failure (CMS-HCC) 08/23/2020   ??? Arthritis    ??? Calculus of kidney    ??? Calculus of ureter    ??? CHF (congestive heart failure) (CMS-HCC)    ??? Chronic atrial fibrillation (CMS-HCC) 07/20/2019   ??? COPD (chronic obstructive pulmonary disease) (CMS-HCC)    ??? Diabetes (CMS-HCC)    ??? Gangrenous cholecystitis 10/11/2020   ??? GERD (gastroesophageal reflux disease)    ??? Hydronephrosis    ??? Hypertension    ??? Hyponatremia 10/11/2020   ??? Intermediate coronary syndrome (CMS-HCC) 03/13/2014   ??? Lower extremity edema 09/28/2020   ??? Lumbar stenosis    ??? Microscopic hematuria    ??? Nausea alone    ??? Nephrolithiasis 04/17/2016   ??? Neuropathy    ??? Nocturia    ??? Other chronic cystitis    ??? Pulmonary hypertension (CMS-HCC)    ??? Renal colic    ??? Sleep apnea    ??? Unstable angina pectoris (CMS-HCC) 03/13/2014       Patient Active Problem List   Diagnosis   ??? Spinal stenosis of lumbar region   ??? Thoracic or lumbosacral neuritis or radiculitis   ??? Lumbosacral spondylosis   ??? COPD (chronic obstructive pulmonary disease) (CMS-HCC)   ??? Sleep apnea in adult   ??? Essential hypertension   ??? AKI (acute kidney injury) (CMS-HCC)   ??? Type 2 diabetes mellitus with stage 3b chronic kidney disease, without long-term current use of insulin (CMS-HCC)   ??? Chronic cystitis   ??? Incomplete bladder emptying   ??? Nocturia   ??? Peripheral neuropathy   ??? Chronic respiratory failure with hypoxia (CMS-HCC)   ??? Anemia   ??? Persistent atrial fibrillation (CMS-HCC)   ??? Anemia in chronic kidney disease   ??? Chronic kidney disease, stage III (moderate) (CMS-HCC)   ??? Enrolled in chronic care management   ??? Pulmonary hypertension (CMS-HCC)   ??? Shortness of breath   ??? Chronic prescription opiate use   ??? Dyspnea on exertion   ??? 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 Cardiac amyloidosis (CMS-HCC)   ??? Recurrent cold sores   ??? Persistent fatigue after COVID-19   ??? Dyspepsia   ??? Iron deficiency anemia due to chronic blood loss   ??? Generalized edema        Past Surgical History:   Procedure Laterality Date   ??? BACK SURGERY  1995   ??? CARPAL TUNNEL RELEASE Left 2014   ??? HYSTERECTOMY  1971   ??? IR INSERT CHOLECYSTOSMY TUBE PERCUTANEOUS  10/02/2020    IR INSERT CHOLECYSTOSMY TUBE PERCUTANEOUS 10/02/2020 Braulio Conte, MD IMG VIR [facility]   ??? LUMBAR DISC SURGERY     ??? PR REMOVAL GALLBLADDER N/A 10/06/2020    Procedure: CHOLECYSTECTOMY;  Surgeon: Katherina Mires, MD;  Location: MAIN OR [facility];  Service: Trauma   ??? PR RIGHT HEART CATH O2 SATURATION & CARDIAC OUTPUT N/A 09/30/2020    Procedure: Right Heart Catheterization;  Surgeon: Neal Dy, MD;  Location: [facility] CATH;  Service: Cardiology   ??? PR RIGHT HEART CATH O2 SATURATION & CARDIAC OUTPUT N/A 01/09/2021    Procedure: Right Heart Catheterization;  Surgeon: Lesle Reek, MD;  Location: [facility] CATH;  Service: Cardiology       No current facility-administered medications for this encounter.    Current Outpatient Medications:   ???  ACCU-CHEK AVIVA PLUS TEST STRP Strp, USE TO CHECK BLOOD SUGAR 3 TIMES A DAY BEFORE MEALS, Disp: , Rfl:   ???  acetaminophen (TYLENOL) 500 MG tablet, Take 1,000 mg by mouth daily as needed for pain. , Disp: , Rfl:   ???  albuterol HFA 90 mcg/actuation inhaler, Inhale 2 puffs every eight (8) hours as needed for wheezing., Disp: , Rfl:   ???  amiodarone (PACERONE) 200 MG tablet, Take 1 tablet (200 mg total) by mouth daily., Disp: 30 tablet, Rfl: 1  ???  apixaban (ELIQUIS) 5 mg Tab, Take 1 tablet (5 mg total) by mouth Two (2) times a day., Disp: 60 tablet, Rfl: 11  ???  estradioL (ESTRACE) 0.01 % (0.1 mg/gram) vaginal cream, INSERT ONE GRAM VAGINALLY AT BEDTIME, Disp: , Rfl:   ???  ferrous sulfate 325 (65 FE) MG tablet, Take 1 tablet (325 mg total) by mouth every other day., Disp: 30 tablet, Rfl: 3  ???  fluticasone propion-salmeteroL (ADVAIR HFA) 115-21 mcg/actuation inhaler, Inhale 2 puffs Two (2) times a day., Disp: 12 g, Rfl: 11  ???  fosfomycin (MONUROL) 3 gram Pack, Take 3 g by mouth once a week for 12 doses., Disp: 36 g, Rfl: 0  ???  gabapentin (NEURONTIN) 300 MG capsule, Take 1 capsule (300 mg total) by mouth Three (3) times a day., Disp: 90 capsule, Rfl: 0  ???  lancets Misc, 1 each by Miscellaneous route daily. Accu Check, Disp: 100 each, Rfl: 6  ???  montelukast (SINGULAIR) 10 mg tablet, Take 1 tablet (10 mg total) by mouth daily., Disp: 90 tablet, Rfl: 3  ???  NARCAN 4 mg/actuation nasal spray, 1 spray into alternating nostrils once as needed (opioid overdose)., Disp: , Rfl:   ???  oxyCODONE (OXYCONTIN) 10 mg TR12 12 hr crush resistant ER/CR tablet, Take 10 mg by mouth every twelve (12) hours., Disp: , Rfl:   ???  OXYGEN-AIR DELIVERY SYSTEMS MISC, 5 L by Miscellaneous route., Disp: , Rfl:   ???  pantoprazole (PROTONIX) 20 MG tablet, Take 1 tablet (20 mg total) by mouth in the morning., Disp: 60 tablet, Rfl: 5  ???  rosuvastatin (CRESTOR) 5 MG tablet, Take 1 tablet (5 mg total) by mouth every other day., Disp: 3015 tablet, Rfl: 11  ???  SITagliptin (JANUVIA) 25 MG tablet, Take 1 tablet (25 mg total) by mouth in the morning., Disp: 30 tablet, Rfl: 11  ???  spironolactone (ALDACTONE) 50 MG tablet, Take 1 tablet (50 mg total) by mouth in the morning., Disp: 30 tablet, Rfl: 0  ???  tafamidis 61 mg cap, Take 1 capsule (61 mg) by mouth daily., Disp: 30 capsule, Rfl: 11  ???  tiotropium bromide (SPIRIVA RESPIMAT) 2.5 mcg/actuation inhalation mist, Inhale 2 puffs daily., Disp: 4 g, Rfl: 11  ???  torsemide (DEMADEX) 20 MG tablet, Take 1 tablet (20 mg total) by mouth daily. May also take 1 tablet (20 mg total) daily as needed (weight gain)., Disp: 60 tablet, Rfl: 11  ???  varicella-zoster gE-AS01B, PF, (SHINGRIX, PF,) 50 mcg/0.5 mL SusR injection, Inject 0.5 mL into the muscle., Disp: 0.5 mL, Rfl: 1    Allergies  Nitrofurantoin and Lipitor [atorvastatin]    Family History   Problem Relation Age of Onset   ??? Hypertension Mother    ??? Cancer Father         COLON CANCER   ??? Anesthesia problems Neg Hx    ??? Broken bones Neg Hx    ??? Clotting disorder Neg Hx    ??? Collagen disease Neg Hx    ??? Diabetes Neg Hx    ??? Dislocations Neg Hx    ??? Fibromyalgia Neg Hx    ??? Gout Neg Hx    ??? Hemophilia Neg Hx    ??? Osteoporosis Neg Hx    ??? Rheumatologic disease Neg Hx    ??? Scoliosis Neg Hx    ??? Severe sprains Neg Hx    ??? Sickle cell anemia Neg Hx    ??? Spinal Compression Fracture Neg Hx    ??? GU problems Neg Hx    ??? Kidney cancer Neg Hx    ??? Prostate cancer Neg Hx        Social History  Social History     Tobacco Use   ??? Smoking status: Former Smoker     Packs/day: 0.33     Types: Cigarettes   ??? Smokeless tobacco: Never Used   ??? Tobacco comment: Quit a few years ago   Vaping Use   ??? Vaping Use: Never used   Substance Use Topics   ??? Alcohol use: No   ??? Drug use: No       Review of Systems  Constitutional: +for generalized weakness. Negative for fever.  Eyes: Negative for visual changes.  ENT: Negative for sore throat.  CV: Negative for chest pain.  Resp: Negative for shortness of breath.  GI: Negative for abdominal pain or nausea.  GU: Negative for dysuria or change in urination.  MSK: Negative for back pain.  Derm: Negative for rash.  Neuro: Negative for headaches.      Physical Exam     ED Triage Vitals [06/18/21 1208]   Enc Vitals Group      BP 111/53      Heart Rate 72      SpO2 Pulse 72      Resp 16      Temp 36.8 ??C (98.2 ??F)      Temp Source Oral      SpO2 91 %      Weight 97.5 kg (215 lb)     Constitutional: Appears stated age. Chronically ill appearing, no acute distress  HEENT: Normocephalic and atraumatic.Conjunctivae clear. No congestion. Moist mucous membranes.   Heme/Lymph/Immuno: No petechiae or bruising  CV: RRR, no murmurs.  Resp: Clear to auscultation bilaterally. No wheezes or rhonchii  GI: Soft and non tender, non distended.   Rectal: light brown stool, hemoccult +  MSK: Normal range of motion in all extremities.  Neuro: Normal speech and language. No gross focal neurologic deficits appreciated.  Skin: Warm, dry and intact.  Psychiatric: Mood and affect are normal. Speech and behavior  are normal.    EKG     NSR at 71 bpm, right axis, low voltage qrs, no st elevation or depression      I personally reviewed the images and lab results for the patient.            Alfredia Ferguson, MD  Resident  06/18/21 [phone]

## 2021-06-18 NOTE — Unmapped (Signed)
Patient daughter would like a call about patient going to get a iron infusion/ blood transfusion.   Please let me know if I can do anything to help you.  Thank you.  [name] [phone]

## 2021-06-18 NOTE — Unmapped (Signed)
Pt is coming in with c/o being told that she needed to come to the ED for a blood transfusion after some blood work from yesterday.

## 2021-06-18 NOTE — Unmapped (Signed)
I called patient with lab results.  Patient is stable, she read me her blood pressures which are all within normal range with normal heart rate.  She just feels like she has no get up and go.  I will copy this message to send to them as a confirmation of our current plan;   ?? Your hemoglobin is at 7.5.  This is just slightly lower than before, but it may be making you feel weak.  You should go to [facility] emergency room to get 1 unit of blood.  You may also need some intravenous Lasix when you get this unit of blood.   ?? Your kidney function is worse.  This may be because of the fluid pills.  I am recommending that you take the torsemide 40 mg every other day and the spironolactone 25 mg every day.  When do you see your nephrologist again?  ?? Because your kidney function is worse you should stop the metformin.  I am going to prescribe another medicine called Januvia for your diabetes.  ?? I will ask Derwood Kaplan if you can do a video visit for Friday.

## 2021-06-19 DIAGNOSIS — Z7901 Long term (current) use of anticoagulants: Principal | ICD-10-CM

## 2021-06-19 NOTE — Unmapped (Signed)
[facility] Refill Coordination Note    Specialty Medication(s) to be Shipped:   General Specialty: Vyndamax 61mg     Other medication(s) to be shipped: No additional medications requested for fill at this time     Barbara Huber, DOB: 13-May-1941  Phone: [phone] (home)       All above HIPAA information was verified with patient.     Was a Nurse, learning disability used for this call? No    Completed refill call assessment today to schedule patient's medication shipment from the [facility] [phone]).  All relevant notes have been reviewed.     Specialty medication(s) and dose(s) confirmed: Regimen is correct and unchanged.   Changes to medications: [name] reports no changes at this time.  Changes to insurance: No  New side effects reported not previously addressed with a pharmacist or physician: None reported  Questions for the pharmacist: No    Confirmed patient received a Conservation officer, historic buildings and a Surveyor, mining with first shipment. The patient will receive a drug information handout for each medication shipped and additional FDA Medication Guides as required.       DISEASE/MEDICATION-SPECIFIC INFORMATION        N/A    SPECIALTY MEDICATION ADHERENCE     Medication Adherence    Patient reported X missed doses in the last month: 0  Specialty Medication: Vyndamax 61mg   Patient is on additional specialty medications: No  Patient is on more than two specialty medications: No              Were doses missed due to medication being on hold? No    Vyndamax 61 mg: 9 days of medicine on hand        REFERRAL TO PHARMACIST     Referral to the pharmacist: Not needed      [facility]     Shipping address confirmed in Epic.     Delivery Scheduled: Yes, Expected medication delivery date: 06/25/21.     Medication will be delivered via Same Day Courier to the prescription address in Epic WAM.    Nancy Nordmann [facility] Specialty Technician

## 2021-06-19 NOTE — Unmapped (Signed)
ED Progress Note    I received a call with regards to positive urine cultures for this patient Strep bovis as well as E. Facelis.  No sensitivities were obtained.  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  Patient's initial [name] was for weakness likely related to anemia.  She is status post a transfusion in the ED.  Per review of the HPI, she did not have any symptoms suggestive of a UTI at the time of her evaluation.  I have called the patient at home who states she is feeling improved at this time.  She denies any complaints of a UTI.  Specifically, she denies any dysuria, hematuria, flank pain or abdominal pain, fevers or chills, foul smell or changes to her urine.  I have spoken to her ID physician, Dr. Kathaleen Grinder, who reviewed her actual UA with me.  Patient's urinalysis noted to have 4 WBCs, trace leukocyte esterase, few bacteria and 22 squams.  Overall clinical picture is reassuring, patient is insistent of lack of UTI symptoms as well as her plan for UA, culture sent likely secondary to colonization and would not recommend treatment at this time.  Patient is confirmed to be on weekly fosfomycin as a prophylaxis.  Patient is noted to have had a follow-up appointment tomorrow.  At this time, I will send a note to her primary care doctor just follow-up with regards to any UTI symptoms and the possible need for repeat UA.  I discussed with patient strict return precautions and the potential need to start antibiotics should she develop any symptoms.

## 2021-06-19 NOTE — Unmapped (Signed)
Contains abnormal data??Urine Culture  Order: [id number] - Reflex for Order [id number]  Status: Final result ??    >100,000 CFU/mL Enterococcus faecalis??Abnormal??   Susceptibility Testing By Consultation Only  >100,000 CFU/mL Streptococcus bovis group??Abnormal??   Susceptibility Testing By Consultation Only    Specimen Source: Clean Catch    No antibiotic treatment noted in the ED will send to provider for review and plan of care.

## 2021-06-19 NOTE — Unmapped (Signed)
See progress note from Debarah Crape MD

## 2021-06-20 ENCOUNTER — Institutional Professional Consult (permissible substitution): Admit: 2021-06-20 | Payer: MEDICARE | Attending: Adult Health | Primary: Adult Health

## 2021-06-20 NOTE — Unmapped (Signed)
[facility] Note    Referring Provider: Artelia Laroche, MD  [address]  [address]  [place],  [place] [address]   Primary Provider: Jacquiline Doe, MD  [address]  [place] [address]   Other Providers:  Dr Barbara Huber    Reason for Visit:  Barbara Huber is a 80 y.o. female being seen for hospital follow up.    Assessment & Plan:  1. 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   - Moderately increased wall thickness with low voltage ECG - she's had issues with hypotension on ARB and BB, as well as atrial arrhythmias. SPEP/UPEP/free light chains without concern. 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 She has one daughter who accompanies her and is informed.   - DC'd Jardiance 10mg  daily due to recurrent UTIs  - Continue amiodarone 200 mg daily   - Continue torsemide 20mg  daily  (did not reduce to every other day due to weight gain). Continue to titrate to weight of 215lbs.   - Continue spironolactone 25mg  (reduced for elevated Cr/K)  - Continue Tafamidis 61 mg daily    2. Persistent atrial fibrillation  - CHA2DS2-VASc score = 5 (1-gender, 2-Age, 1-DM, 1-HTN)  - Recommend indefinite anticoagulation; however she has had recent issues with likely occult GI bleeding requiring transfusion. Does not want GI workup with EGD/colonoscopy which is reasonable given her age and comorbidities. Could reduce Eliquis to 2.5mg  BID (age almost 44, renal function) vs stop all together - continue discussion w/ family and PCP  - She had been in afib since at least August 2021; s/p cardioversion 1/14 and continues to be in NSR since  - Continue amiodarone 200mg  daily    3. COPD  - home O2 3L Barbara [facility]er - followed by pulm  - on Advair and Spiriva    4. Anemia  - does not want GI workup  - as above, consider reducing vs stopping Eliquis  Lab Results   Component Value Date    HGB 8.5 (L) 06/23/2021         Follow-up:  Return in about 3 months (around 09/20/2021) for already scheduled appointment.      History of Present Illness:  Barbara Huber is a 80 y.o. female with PMHx of COPD on 3L home [place], CHF, [name], [name], T2DM??who presents today for hospital follow-up. She presented to [facility]??with Acute on Chronic Decompensated Diastolic CHF in the setting of Septic Shock d/t Gangrenous Cholecystitis now s/p cholecystectomy 10/6. She presented with 1 week of dyspnea on exertion, leg swelling and abdominal distention without chest pain. Her dry weight was 232 pounds,??238 pounds. Upon arrival to [facility], she was in afib with RVR with higher rates and softer pressures requiring NE and addition of vaso to maintain MAP>65. She was given digoxin x 1 for better rate control with mild improvement in rates but was still requiring high doses of NE as well as vaso.??Dobutamine given to augment diuresis via bumex drip and metolazone x1 dose. Etiology of pressor requirement likely due to septic shock. Echo 10/4 showed upper-normal RV size with??reduced systolic function.??RHC to evaluate for RV dysfunction unable to be completed due to arterial placement of micropuncture sheath during procedure. She was weaned off of pressors and continued to improve with diuresis  and treatment of underlying infection. At time of discharge, her acute Diastolic HF was controlled and she was restarted on home meds. For her afib, coreg was held during admission but then restarted prior to discharge.     She as seen in hospital follow-up 10/30/20. She was discharged on Lasix 40mg  BID. Her weight was 221lbs on discharge. Lasix was reduced to 20mg  daily by her PCP for low SBPs (although notes say 40mg  daily). Since reducing the Lasix, her weight crept up to 226lbs. Her belly felt  tight and she had LE edema.  At visit with PCP, her Coreg was also changed to metoprolol 25mg  and irbesartan was stopped. BPs were running 110s/70-90s. HR running around 100-110. Her metoprolol was increased to 50mg  for rate control. Lasix was increased to 40mg  daily.    She was seen 11/18 and her weight was been running 224-227lbs. She has low energy and since increasing metoprolol she notes worsening of her symptoms. She feels bloated and has LE edema. She is not back to her usual activities, like fixing her food or cooking. Her BP is running 100-120s/70-80s.     PYP scan showed TTR amyloid. She then saw Dr Barbara Huber 12/3 and was feeling ok but somewhat volume up.   We met her on 1/7. Metoprolol was decreased to 50mg  daily and she was started on amiodarone. She unfortunately was hospitalized 1/11-1/15 for 3 days of worsening shortness of breath and fatigue.  She was found to have AKI and oliguria in the setting of increased beta blocker as an outpatient resulting in decreased cardiac filling/output. She was treated with gentle diuresis and removal of beta blockade with subsequent improvement in blood pressure and renal function. She underwent RHC with showed elevated filling pressures, normal CO, and restrictive filling pattern. She had a DCCV 1/14 with resolution of atrial fibrillation. She was continued on amiodarone.    Interval History  She is contacted today for phone follow-up. Since her last visit in clinic 03/21/21,  she has had multiple admissions: 4/22 for weakness/urosepsis, 4/27 for shortness of breath/orthopnea/weight gain, 6/7 for lightheadedness/SOB/Blurry vision found to be anemic. She also had two recent [name] 6/17 for lower extremity edema and dyspnea on exertion in the setting of recently being taken off her torsemide and spironolactone, and again 6/22 for anemia with Hgb down to 7.5. Today she states her energy is a little better and she has more of an appetite. She has no swelling or SOB. She is sleeping fine at night without orthopnea/PND. Her weight was 215lbs yesterday and 217lbs today so she is taking torsemide daily still rather than every other day.     Cardiovascular History & Procedures:    Cath / PCI:  ?? none    CV Surgery:  ??  none    EP Procedures and Devices:  ?? none    Non-Invasive Evaluation(s):  Echo:  ?? 09/30/20  Summary    1. Limited study to assess systolic function.    2. IVC size and inspiratory change suggest normal right atrial pressure.  (0-5 mmHg).    3. The left ventricle is normal in size with mildly to moderately increased  wall thickness.    4. The left ventricular systolic function is normal with no obvious wall  motion abnormalities, LVEF is visually estimated at > 55%.    5. Mitral annular calcification is present.    6. The left atrium is mildly dilated in size.    7. The right ventricle is  upper normal in size, with reduced systolic  Function.    ?? 04/24/21  1. The left ventricle is normal in size with mildly increased wall  thickness.    2. The left ventricular systolic function is normal, LVEF is visually  estimated at > 55%.    3. There is grade II diastolic dysfunction (elevated filling pressure).    4. Mitral annular calcification is present (mild).    5. The left atrium is mildly dilated in size.    6. The right ventricle is mildly dilated in size, with low normal systolic  function.    7. IVC size and inspiratory change suggest elevated right atrial pressure.  (10-20 mmHg).    Cardiac CT/MRI/Nuclear Tests:  ??  None    6 Minute Walk:  ?? None    Cardiopulmonary Stress Tests:  ??  None               Other Past Medical History:  See below for the complete EPIC list of past medical and surgical history.      Allergies:  Nitrofurantoin and Lipitor [atorvastatin]    Current Medications:  Current Outpatient Medications   Medication Sig Dispense Refill   ??? ACCU-CHEK AVIVA PLUS TEST STRP Strp USE TO CHECK BLOOD SUGAR 3 TIMES A DAY BEFORE MEALS     ??? acetaminophen (TYLENOL) 500 MG tablet Take 1,000 mg by mouth daily as needed for pain.      ??? albuterol HFA 90 mcg/actuation inhaler Inhale 2 puffs every eight (8) hours as needed for wheezing.     ??? amiodarone (PACERONE) 200 MG tablet Take 1 tablet (200 mg total) by mouth daily. 30 tablet 1   ??? apixaban (ELIQUIS) 5 mg Tab Take 1 tablet (5 mg total) by mouth Two (2) times a day. 60 tablet 11   ??? estradioL (ESTRACE) 0.01 % (0.1 mg/gram) vaginal cream INSERT ONE GRAM VAGINALLY AT BEDTIME     ??? ferrous sulfate 325 (65 FE) MG tablet Take 1 tablet (325 mg total) by mouth every other day. 30 tablet 3   ??? fluticasone propion-salmeteroL (ADVAIR HFA) 115-21 mcg/actuation inhaler Inhale 2 puffs Two (2) times a day. 12 g 11   ??? fosfomycin (MONUROL) 3 gram Pack Take 3 g by mouth once a week for 12 doses. 36 g 0   ??? gabapentin (NEURONTIN) 300 MG capsule Take 1 capsule (300 mg total) by mouth Three (3) times a day. 90 capsule 0   ??? lancets Misc 1 each by Miscellaneous route daily. Accu Check 100 each 6   ??? montelukast (SINGULAIR) 10 mg tablet Take 1 tablet (10 mg total) by mouth daily. 90 tablet 3   ??? NARCAN 4 mg/actuation nasal spray 1 spray into alternating nostrils once as needed (opioid overdose).     ??? oxyCODONE (OXYCONTIN) 10 mg TR12 12 hr crush resistant ER/CR tablet Take 10 mg by mouth every twelve (12) hours.     ??? OXYGEN-AIR DELIVERY SYSTEMS MISC 5 L by Miscellaneous route. 3 L/min via [place]     ??? pantoprazole (PROTONIX) 20 MG tablet Take 1 tablet (20 mg total) by mouth in the morning. 60 tablet 5   ??? rosuvastatin (CRESTOR) 5 MG tablet Take 1 tablet (5 mg total) by mouth every other day. 3015 tablet 11   ??? SITagliptin (JANUVIA) 25 MG tablet Take 1 tablet (25 mg total) by mouth in the morning. 30 tablet 11   ??? spironolactone (ALDACTONE) 50 MG tablet Take 1 tablet (50  mg total) by mouth in the morning. 30 tablet 0   ??? tafamidis 61 mg cap Take 1 capsule (61 mg) by mouth daily. 30 capsule 11   ??? tiotropium bromide (SPIRIVA RESPIMAT) 2.5 mcg/actuation inhalation mist Inhale 2 puffs daily. 4 g 11   ??? torsemide (DEMADEX) 20 MG tablet Take 1 tablet (20 mg total) by mouth daily. May also take 1 tablet (20 mg total) daily as needed (weight gain). 60 tablet 11   ??? varicella-zoster [id number], PF, (SHINGRIX, PF,) 50 mcg/0.5 mL SusR injection Inject 0.5 mL into the muscle. 0.5 mL 1     No current facility-administered medications for this visit.       Family History:  The patient's family history includes Cancer in her father; Hypertension in her mother.    Social history:  She  reports that she quit smoking about 3 years ago. Her smoking use included cigarettes. She smoked 0.33 packs per day. She has never used smokeless tobacco. She reports that she does not drink alcohol and does not use drugs.    Review of Systems:  As per HPI.  Rest of the review of ten systems is negative or unremarkable except as stated above.    Physical Exam:  Deferred - phone    Pertinent Laboratory Studies:   Admission on 06/18/2021, Discharged on 06/18/2021   Component Date Value Ref Range Status   ??? Sodium 06/18/2021 137  135 - 145 mmol/L Final   ??? Potassium 06/18/2021 5.3 (A) 3.4 - 4.8 mmol/L Final   ??? Chloride 06/18/2021 98  98 - 107 mmol/L Final   ??? CO2 06/18/2021 30.8  20.0 - 31.0 mmol/L Final   ??? Anion Gap 06/18/2021 8  5 - 14 mmol/L Final   ??? BUN 06/18/2021 36 (A) 9 - 23 mg/dL Final   ??? Creatinine 06/18/2021 2.47 (A) 0.60 - 0.80 mg/dL Final   ??? BUN/Creatinine Ratio 06/18/2021 15   Final   ??? eGFR [name]-EPI (2021) Female 06/18/2021 19 (A) >=60 mL/min/1.59m2 Final   ??? Glucose 06/18/2021 119  70 - 179 mg/dL Final   ??? Calcium 69/62/9528 8.9  8.7 - 10.4 mg/dL Final   ??? Albumin 41/32/4401 3.8  3.4 - 5.0 g/dL Final   ??? Total Protein 06/18/2021 7.3  5.7 - 8.2 g/dL Final   ??? Total Bilirubin 06/18/2021 0.4  0.3 - 1.2 mg/dL Final   ??? AST 02/72/5366 16  <=34 U/L Final   ??? ALT 06/18/2021 <7 (A) 10 - 49 U/L Final   ??? Alkaline Phosphatase 06/18/2021 48  46 - 116 U/L Final   ??? Blood Type 06/18/2021 A POS   Final   ??? Antibody Screen 06/18/2021 NEG   Final   ??? PT 06/18/2021 28.5 (A) 10.3 - 13.4 sec Final   ??? INR 06/18/2021 2.46   Final ??? WBC 06/18/2021 8.0  3.6 - 11.2 10*9/L Final   ??? RBC 06/18/2021 3.20 (A) 3.95 - 5.13 10*12/L Final   ??? HGB 06/18/2021 7.5 (A) 11.3 - 14.9 g/dL Final   ??? HCT 44/02/4741 24.5 (A) 34.0 - 44.0 % Final   ??? MCV 06/18/2021 76.5 (A) 77.6 - 95.7 fL Final   ??? MCH 06/18/2021 23.3 (A) 25.9 - 32.4 pg Final   ??? MCHC 06/18/2021 30.5 (A) 32.0 - 36.0 g/dL Final   ??? RDW 59/56/3875 20.8 (A) 12.2 - 15.2 % Final   ??? MPV 06/18/2021 8.0  6.8 - 10.7 fL Final   ??? Platelet 06/18/2021 241  150 -  450 10*9/L Final   ??? nRBC 06/18/2021 0  <=4 /100 WBCs Final   ??? Neutrophils % 06/18/2021 69.9  % Final   ??? Lymphocytes % 06/18/2021 15.3  % Final   ??? Monocytes % 06/18/2021 11.1  % Final   ??? Eosinophils % 06/18/2021 3.2  % Final   ??? Basophils % 06/18/2021 0.5  % Final   ??? Absolute Neutrophils 06/18/2021 5.6  1.8 - 7.8 10*9/L Final   ??? Absolute Lymphocytes 06/18/2021 1.2  1.1 - 3.6 10*9/L Final   ??? Absolute Monocytes 06/18/2021 0.9 (A) 0.3 - 0.8 10*9/L Final   ??? Absolute Eosinophils 06/18/2021 0.3  0.0 - 0.5 10*9/L Final   ??? Absolute Basophils 06/18/2021 0.0  0.0 - 0.1 10*9/L Final   ??? Anisocytosis 06/18/2021 Marked (A) Not Present Final   ??? Color, UA 06/18/2021 Yellow   Final   ??? Clarity, UA 06/18/2021 Hazy   Final   ??? Specific Gravity, UA 06/18/2021 1.015  1.005 - 1.040 Final   ??? pH, UA 06/18/2021 7.5  5.0 - 9.0 Final   ??? Leukocyte Esterase, UA 06/18/2021 Trace (A) Negative Final   ??? Nitrite, UA 06/18/2021 Negative  Negative Final   ??? Protein, UA 06/18/2021 Negative  Negative Final   ??? Glucose, UA 06/18/2021 Negative  Negative Final   ??? Ketones, UA 06/18/2021 Negative  Negative Final   ??? Urobilinogen, UA 06/18/2021 0.2 mg/dL   Final   ??? Bilirubin, UA 06/18/2021 Negative  Negative Final   ??? Blood, UA 06/18/2021 Negative  Negative Final   ??? RBC, UA 06/18/2021 3  <4 /HPF Final   ??? WBC, UA 06/18/2021 4  0 - 5 /HPF Final   ??? Squam Epithel, UA 06/18/2021 22 (A) 0 - 5 /HPF Final   ??? Bacteria, UA 06/18/2021 Few (A) None Seen /HPF Final   ??? Mucus, UA 06/18/2021 Occasional (A) None Seen /HPF Final   ??? EKG Ventricular Rate 06/18/2021 71  BPM Final   ??? EKG Atrial Rate 06/18/2021 71  BPM Final   ??? EKG P-R Interval 06/18/2021 208  ms Final   ??? EKG QRS Duration 06/18/2021 86  ms Final   ??? EKG Q-T Interval 06/18/2021 452  ms Final   ??? EKG QTC Calculation 06/18/2021 491  ms Final   ??? EKG Calculated P Axis 06/18/2021 69  degrees Final   ??? EKG Calculated R Axis 06/18/2021 101  degrees Final   ??? EKG Calculated T Axis 06/18/2021 77  degrees Final   ??? QTC Fredericia 06/18/2021 478  ms Final   ??? Urine Culture, Comprehensive 06/18/2021 >100,000 CFU/mL Enterococcus faecalis (A)  Final   ??? Urine Culture, Comprehensive 06/18/2021 >100,000 CFU/mL Streptococcus bovis group (A)  Final   ??? Crossmatch 06/19/2021 Compatible   Final   ??? Unit Blood Type 06/19/2021 A Neg   Final   ??? ISBT Number 06/19/2021 [id number]   Final   ??? Unit # 06/19/2021 [id number]   Final   ??? Status 06/19/2021 Transfused   Final   ??? Product ID 06/19/2021 Red Blood Cells   Final   ??? PRODUCT CODE 06/19/2021 [id number]   Final   Appointment on 06/17/2021   Component Date Value Ref Range Status   ??? Magnesium 06/17/2021 3.6 (A) 1.6 - 2.6 mg/dL Final   ??? Ferritin 95/62/1308 9.8  7.3 - 270.7 ng/mL Final   ??? Iron 06/17/2021 <20 (A) 50 - 170 ug/dL Final   ??? TIBC 65/78/4696 368  250 - 425  ug/dL Final   ??? WBC 62/13/0865 7.0  3.6 - 11.2 10*9/L Final   ??? RBC 06/17/2021 3.15 (A) 3.95 - 5.13 10*12/L Final   ??? HGB 06/17/2021 7.5 (A) 11.3 - 14.9 g/dL Final   ??? HCT 78/46/9629 24.1 (A) 34.0 - 44.0 % Final   ??? MCV 06/17/2021 76.4 (A) 77.6 - 95.7 fL Final   ??? MCH 06/17/2021 23.8 (A) 25.9 - 32.4 pg Final   ??? MCHC 06/17/2021 31.1 (A) 32.0 - 36.0 g/dL Final   ??? RDW 52/84/1324 20.7 (A) 12.2 - 15.2 % Final   ??? MPV 06/17/2021 8.0  6.8 - 10.7 fL Final   ??? Platelet 06/17/2021 219  150 - 450 10*9/L Final   ??? Neutrophils % 06/17/2021 69.3  % Final   ??? Lymphocytes % 06/17/2021 15.8  % Final   ??? Monocytes % 06/17/2021 10.8  % Final   ??? Eosinophils % 06/17/2021 2.8  % Final   ??? Basophils % 06/17/2021 1.3  % Final   ??? Absolute Neutrophils 06/17/2021 4.9  1.8 - 7.8 10*9/L Final   ??? Absolute Lymphocytes 06/17/2021 1.1  1.1 - 3.6 10*9/L Final   ??? Absolute Monocytes 06/17/2021 0.8  0.3 - 0.8 10*9/L Final   ??? Absolute Eosinophils 06/17/2021 0.2  0.0 - 0.5 10*9/L Final   ??? Absolute Basophils 06/17/2021 0.1  0.0 - 0.1 10*9/L Final   ??? Anisocytosis 06/17/2021 Marked (A) Not Present Final   ??? Smear Review Comments 06/17/2021 See Comment (A) Undefined Final   ??? Polychromasia 06/17/2021 Slight (A) Not Present Final   ??? Basophilic Stippling 06/17/2021 Present (A) Not Present Final   ??? Sodium 06/17/2021 139  135 - 145 mmol/L Final   ??? Potassium 06/17/2021 4.9 (A) 3.4 - 4.8 mmol/L Final   ??? Chloride 06/17/2021 101  98 - 107 mmol/L Final   ??? CO2 06/17/2021 31.1 (A) 20.0 - 31.0 mmol/L Final   ??? Anion Gap 06/17/2021 7  5 - 14 mmol/L Final   ??? BUN 06/17/2021 29 (A) 9 - 23 mg/dL Final   ??? Creatinine 06/17/2021 2.21 (A) 0.60 - 0.80 mg/dL Final   ??? BUN/Creatinine Ratio 06/17/2021 13   Final   ??? eGFR [name]-EPI (2021) Female 06/17/2021 22 (A) >=60 mL/min/1.73m2 Final   ??? Glucose 06/17/2021 95  70 - 179 mg/dL Final   ??? Calcium 40/09/2724 9.1  8.7 - 10.4 mg/dL Final   ??? TSH 36/64/4034 2.122  0.550 - 4.780 uIU/mL Final   Admission on 06/12/2021, Discharged on 06/13/2021   Component Date Value Ref Range Status   ??? hsTroponin I 06/12/2021 42 (A) <=34 ng/L Final   ??? EKG Ventricular Rate 06/12/2021 64  BPM Final   ??? EKG Atrial Rate 06/12/2021 64  BPM Final   ??? EKG P-R Interval 06/12/2021 204  ms Final   ??? EKG QRS Duration 06/12/2021 92  ms Final   ??? EKG Q-T Interval 06/12/2021 470  ms Final   ??? EKG QTC Calculation 06/12/2021 484  ms Final   ??? EKG Calculated P Axis 06/12/2021 72  degrees Final   ??? EKG Calculated R Axis 06/12/2021 0  degrees Final   ??? EKG Calculated T Axis 06/12/2021 38  degrees Final   ??? QTC Fredericia 06/12/2021 480  ms Final   ??? Sodium 06/12/2021 139  135 - 145 mmol/L Final ??? Potassium 06/12/2021 4.5  3.4 - 4.8 mmol/L Final   ??? Chloride 06/12/2021 101  98 - 107 mmol/L Final   ???  CO2 06/12/2021 31.4 (A) 20.0 - 31.0 mmol/L Final   ??? Anion Gap 06/12/2021 7  5 - 14 mmol/L Final   ??? BUN 06/12/2021 34 (A) 9 - 23 mg/dL Final   ??? Creatinine 06/12/2021 1.90 (A) 0.60 - 0.80 mg/dL Final   ??? BUN/Creatinine Ratio 06/12/2021 18   Final   ??? eGFR [name]-EPI (2021) Female 06/12/2021 27 (A) >=60 mL/min/1.28m2 Final   ??? Glucose 06/12/2021 134  70 - 179 mg/dL Final   ??? Calcium 16/09/9603 8.7  8.7 - 10.4 mg/dL Final   ??? Albumin 54/08/8118 3.8  3.4 - 5.0 g/dL Final   ??? Total Protein 06/12/2021 7.5  5.7 - 8.2 g/dL Final   ??? Total Bilirubin 06/12/2021 0.3  0.3 - 1.2 mg/dL Final   ??? AST 14/78/2956 17  <=34 U/L Final   ??? ALT 06/12/2021 <7 (A) 10 - 49 U/L Final   ??? Alkaline Phosphatase 06/12/2021 53  46 - 116 U/L Final   ??? BNP 06/12/2021 504.58 (A) <=100 pg/mL Final   ??? WBC 06/12/2021 6.8  3.6 - 11.2 10*9/L Final   ??? RBC 06/12/2021 3.39 (A) 3.95 - 5.13 10*12/L Final   ??? HGB 06/12/2021 7.9 (A) 11.3 - 14.9 g/dL Final   ??? HCT 21/30/8657 26.6 (A) 34.0 - 44.0 % Final   ??? MCV 06/12/2021 78.4  77.6 - 95.7 fL Final   ??? MCH 06/12/2021 23.4 (A) 25.9 - 32.4 pg Final   ??? MCHC 06/12/2021 29.8 (A) 32.0 - 36.0 g/dL Final   ??? RDW 84/69/6295 20.6 (A) 12.2 - 15.2 % Final   ??? MPV 06/12/2021 7.8  6.8 - 10.7 fL Final   ??? Platelet 06/12/2021 217  150 - 450 10*9/L Final   ??? nRBC 06/12/2021 0  <=4 /100 WBCs Final   ??? Neutrophils % 06/12/2021 56.2  % Final   ??? Lymphocytes % 06/12/2021 24.9  % Final   ??? Monocytes % 06/12/2021 11.8  % Final   ??? Eosinophils % 06/12/2021 4.4  % Final   ??? Basophils % 06/12/2021 2.7  % Final   ??? Absolute Neutrophils 06/12/2021 3.8  1.8 - 7.8 10*9/L Final   ??? Absolute Lymphocytes 06/12/2021 1.7  1.1 - 3.6 10*9/L Final   ??? Absolute Monocytes 06/12/2021 0.8  0.3 - 0.8 10*9/L Final   ??? Absolute Eosinophils 06/12/2021 0.3  0.0 - 0.5 10*9/L Final   ??? Absolute Basophils 06/12/2021 0.2 (A) 0.0 - 0.1 10*9/L Final   ??? Anisocytosis 06/12/2021 Marked (A) Not Present Final   ??? EKG Ventricular Rate 06/13/2021 65  BPM Final   ??? EKG Atrial Rate 06/13/2021 65  BPM Final   ??? EKG P-R Interval 06/13/2021 202  ms Final   ??? EKG QRS Duration 06/13/2021 92  ms Final   ??? EKG Q-T Interval 06/13/2021 468  ms Final   ??? EKG QTC Calculation 06/13/2021 486  ms Final   ??? EKG Calculated P Axis 06/13/2021 52  degrees Final   ??? EKG Calculated R Axis 06/13/2021 1  degrees Final   ??? EKG Calculated T Axis 06/13/2021 27  degrees Final   ??? QTC Fredericia 06/13/2021 480  ms Final   ??? Smear Review Comments 06/12/2021 See Comment (A) Undefined Final   ??? Polychromasia 06/12/2021 Slight (A) Not Present Final   Appointment on 06/09/2021   Component Date Value Ref Range Status   ??? Sodium 06/09/2021 141  135 - 145 mmol/L Final   ??? Potassium 06/09/2021 4.5  3.4 -  4.8 mmol/L Final   ??? Chloride 06/09/2021 103  98 - 107 mmol/L Final   ??? CO2 06/09/2021 27.8  20.0 - 31.0 mmol/L Final   ??? Anion Gap 06/09/2021 10  5 - 14 mmol/L Final   ??? BUN 06/09/2021 45 (A) 9 - 23 mg/dL Final   ??? Creatinine 06/09/2021 2.21 (A) 0.60 - 0.80 mg/dL Final   ??? BUN/Creatinine Ratio 06/09/2021 20   Final   ??? eGFR [name]-EPI (2021) Female 06/09/2021 22 (A) >=60 mL/min/1.94m2 Final   ??? Glucose 06/09/2021 171  70 - 179 mg/dL Final   ??? Calcium 02/72/5366 8.6 (A) 8.7 - 10.4 mg/dL Final   ??? BNP 44/02/4741 394.47 (A) <=100 pg/mL Final   ??? WBC 06/09/2021 5.7  3.6 - 11.2 10*9/L Final   ??? RBC 06/09/2021 3.29 (A) 3.95 - 5.13 10*12/L Final   ??? HGB 06/09/2021 7.8 (A) 11.3 - 14.9 g/dL Final   ??? HCT 59/56/3875 25.8 (A) 34.0 - 44.0 % Final   ??? MCV 06/09/2021 78.3  77.6 - 95.7 fL Final   ??? MCH 06/09/2021 23.7 (A) 25.9 - 32.4 pg Final   ??? MCHC 06/09/2021 30.3 (A) 32.0 - 36.0 g/dL Final   ??? RDW 64/33/2951 20.7 (A) 12.2 - 15.2 % Final   ??? MPV 06/09/2021 7.1  6.8 - 10.7 fL Final   ??? Platelet 06/09/2021 276  150 - 450 10*9/L Final   ??? Magnesium 06/09/2021 3.0 (A) 1.6 - 2.6 mg/dL Final   Admission on 88/41/6606, Discharged on 06/04/2021   Component Date Value Ref Range Status   ??? EKG Ventricular Rate 06/02/2021 66  BPM Final   ??? EKG Atrial Rate 06/02/2021 66  BPM Final   ??? EKG P-R Interval 06/02/2021 204  ms Final   ??? EKG QRS Duration 06/02/2021 90  ms Final   ??? EKG Q-T Interval 06/02/2021 466  ms Final   ??? EKG QTC Calculation 06/02/2021 488  ms Final   ??? EKG Calculated P Axis 06/02/2021 51  degrees Final   ??? EKG Calculated R Axis 06/02/2021 7  degrees Final   ??? EKG Calculated T Axis 06/02/2021 35  degrees Final   ??? QTC Fredericia 06/02/2021 481  ms Final   ??? Sodium 06/02/2021 133 (A) 135 - 145 mmol/L Final   ??? Potassium 06/02/2021 4.8  3.4 - 4.8 mmol/L Final   ??? Chloride 06/02/2021 97 (A) 98 - 107 mmol/L Final   ??? CO2 06/02/2021 31.1 (A) 20.0 - 31.0 mmol/L Final   ??? Anion Gap 06/02/2021 5  5 - 14 mmol/L Final   ??? BUN 06/02/2021 46 (A) 9 - 23 mg/dL Final   ??? Creatinine 06/02/2021 2.57 (A) 0.60 - 0.80 mg/dL Final   ??? BUN/Creatinine Ratio 06/02/2021 18   Final   ??? eGFR [name]-EPI (2021) Female 06/02/2021 19 (A) >=60 mL/min/1.14m2 Final   ??? Glucose 06/02/2021 133  70 - 179 mg/dL Final   ??? Calcium 30/16/0109 8.2 (A) 8.7 - 10.4 mg/dL Final   ??? Albumin 32/35/5732 3.6  3.4 - 5.0 g/dL Final   ??? Total Protein 06/02/2021 7.3  5.7 - 8.2 g/dL Final   ??? Total Bilirubin 06/02/2021 0.4  0.3 - 1.2 mg/dL Final   ??? AST 20/25/4270 12  <=34 U/L Final   ??? ALT 06/02/2021 9 (A) 10 - 49 U/L Final   ??? Alkaline Phosphatase 06/02/2021 57  46 - 116 U/L Final   ??? hsTroponin I 06/02/2021 50 (A) <=34 ng/L Final   ???  BNP 06/02/2021 479.48 (A) <=100 pg/mL Final   ??? WBC 06/02/2021 6.9  3.6 - 11.2 10*9/L Final   ??? RBC 06/02/2021 2.96 (A) 3.95 - 5.13 10*12/L Final   ??? HGB 06/02/2021 7.0 (A) 11.3 - 14.9 g/dL Final   ??? HCT 86/57/8469 23.1 (A) 34.0 - 44.0 % Final   ??? MCV 06/02/2021 78.0  77.6 - 95.7 fL Final   ??? MCH 06/02/2021 23.5 (A) 25.9 - 32.4 pg Final   ??? MCHC 06/02/2021 30.1 (A) 32.0 - 36.0 g/dL Final   ??? RDW 62/95/2841 18.4 (A) 12.2 - 15.2 % Final   ??? MPV 06/02/2021 7.5 6.8 - 10.7 fL Final   ??? Platelet 06/02/2021 311  150 - 450 10*9/L Final   ??? nRBC 06/02/2021 0  <=4 /100 WBCs Final   ??? Neutrophils % 06/02/2021 68.9  % Final   ??? Lymphocytes % 06/02/2021 14.0  % Final   ??? Monocytes % 06/02/2021 13.1  % Final   ??? Eosinophils % 06/02/2021 2.6  % Final   ??? Basophils % 06/02/2021 1.4  % Final   ??? Absolute Neutrophils 06/02/2021 4.7  1.8 - 7.8 10*9/L Final   ??? Absolute Lymphocytes 06/02/2021 1.0 (A) 1.1 - 3.6 10*9/L Final   ??? Absolute Monocytes 06/02/2021 0.9 (A) 0.3 - 0.8 10*9/L Final   ??? Absolute Eosinophils 06/02/2021 0.2  0.0 - 0.5 10*9/L Final   ??? Absolute Basophils 06/02/2021 0.1  0.0 - 0.1 10*9/L Final   ??? Anisocytosis 06/02/2021 Slight (A) Not Present Final   ??? Color, UA 06/03/2021 Light Yellow   Final   ??? Clarity, UA 06/03/2021 Clear   Final   ??? Specific Gravity, UA 06/03/2021 1.015  1.005 - 1.040 Final   ??? pH, UA 06/03/2021 7.0  5.0 - 9.0 Final   ??? Leukocyte Esterase, UA 06/03/2021 Moderate (A) Negative Final   ??? Nitrite, UA 06/03/2021 Negative  Negative Final   ??? Protein, UA 06/03/2021 Negative  Negative Final   ??? Glucose, UA 06/03/2021 Negative  Negative Final   ??? Ketones, UA 06/03/2021 Negative  Negative Final   ??? Urobilinogen, UA 06/03/2021 0.2 mg/dL   Final   ??? Bilirubin, UA 06/03/2021 Negative  Negative Final   ??? Blood, UA 06/03/2021 Negative  Negative Final   ??? RBC, UA 06/03/2021 <1  <4 /HPF Final   ??? WBC, UA 06/03/2021 12 (A) 0 - 5 /HPF Final   ??? Squam Epithel, UA 06/03/2021 7 (A) 0 - 5 /HPF Final   ??? Bacteria, UA 06/03/2021 Moderate (A) None Seen /HPF Final   ??? Amorphous Crystal, UA 06/03/2021 Moderate  /HPF Final   ??? Check Type 06/03/2021 Need Type Check   Final   ??? Blood Type 06/03/2021 A POS   Final   ??? Antibody Screen 06/03/2021 NEG   Final   ??? SARS-CoV-2 PCR 06/03/2021 Positive (A) Negative Final   ??? SARS-CoV-2 CT Value 06/03/2021 38.8   Final   ??? CRP 06/03/2021 <4.0  <=10.0 mg/L Final   ??? Ferritin 06/03/2021 9.8  7.3 - 270.7 ng/mL Final   ??? D-Dimer 06/03/2021 <215  <=500 ng/mL FEU Final   ??? LDH 06/03/2021 632 (A) 120 - 246 U/L Final   ??? HIV Antigen/Antibody Combo 06/03/2021 Nonreactive  Nonreactive Final   ??? Blood Type 06/03/2021 A POS   Final   ??? Crossmatch 06/04/2021 Compatible   Final   ??? Unit Blood Type 06/04/2021 A Pos   Final   ??? ISBT Number 06/04/2021 [id number]  Final   ??? Unit # 06/04/2021 [id number]   Final   ??? Status 06/04/2021 Transfused   Final   ??? Product ID 06/04/2021 Red Blood Cells   Final   ??? PRODUCT CODE 06/04/2021 [id number]   Final   ??? Urine Culture, Comprehensive 06/03/2021 Mixed Gram Positive/Gram Negative Organisms Isolated (A)  Final   ??? Iron 06/03/2021 <20 (A) 50 - 170 ug/dL Final   ??? TIBC 95/62/1308 320.0  mg/dL Final   ??? Transferrin 06/03/2021 254.0  250.0 - 380.0 mg/dL Final   ??? Iron Saturation (%) 06/03/2021    Final   ??? WBC 06/03/2021 7.8  3.6 - 11.2 10*9/L Final   ??? RBC 06/03/2021 3.26 (A) 3.95 - 5.13 10*12/L Final   ??? HGB 06/03/2021 8.1 (A) 11.3 - 14.9 g/dL Final   ??? HCT 65/78/4696 25.7 (A) 34.0 - 44.0 % Final   ??? MCV 06/03/2021 78.9  77.6 - 95.7 fL Final   ??? MCH 06/03/2021 24.9 (A) 25.9 - 32.4 pg Final   ??? MCHC 06/03/2021 31.6 (A) 32.0 - 36.0 g/dL Final   ??? RDW 29/52/8413 18.5 (A) 12.2 - 15.2 % Final   ??? MPV 06/03/2021 7.3  6.8 - 10.7 fL Final   ??? Platelet 06/03/2021 298  150 - 450 10*9/L Final   ??? Glucose, POC 06/03/2021 127  70 - 179 mg/dL Final   ??? Magnesium 24/40/1027 3.9 (A) 1.6 - 2.6 mg/dL Final   ??? Phosphorus 06/03/2021 4.7  2.4 - 5.1 mg/dL Final   ??? Sodium 25/36/6440 134 (A) 135 - 145 mmol/L Final   ??? Potassium 06/03/2021 4.4  3.4 - 4.8 mmol/L Final   ??? Chloride 06/03/2021 98  98 - 107 mmol/L Final   ??? CO2 06/03/2021 27.9  20.0 - 31.0 mmol/L Final   ??? Anion Gap 06/03/2021 8  5 - 14 mmol/L Final   ??? BUN 06/03/2021 40 (A) 9 - 23 mg/dL Final   ??? Creatinine 06/03/2021 2.34 (A) 0.60 - 0.80 mg/dL Final   ??? BUN/Creatinine Ratio 06/03/2021 17   Final   ??? eGFR [name]-EPI (2021) Female 06/03/2021 21 (A) >=60 mL/min/1.73m2 Final   ??? Glucose 06/03/2021 182 (A) 70 - 179 mg/dL Final   ??? Calcium 34/74/2595 8.4 (A) 8.7 - 10.4 mg/dL Final   ??? Albumin 63/87/5643 3.6  3.4 - 5.0 g/dL Final   ??? Total Protein 06/03/2021 7.1  5.7 - 8.2 g/dL Final   ??? Total Bilirubin 06/03/2021 0.5  0.3 - 1.2 mg/dL Final   ??? AST 32/95/1884 14  <=34 U/L Final   ??? ALT 06/03/2021 9 (A) 10 - 49 U/L Final   ??? Alkaline Phosphatase 06/03/2021 52  46 - 116 U/L Final   ??? Glucose, POC 06/03/2021 133  70 - 179 mg/dL Final   ??? Glucose, POC 06/03/2021 130  70 - 179 mg/dL Final   ??? Glucose, POC 06/03/2021 156  70 - 179 mg/dL Final   ??? WBC 16/60/6301 6.9  3.6 - 11.2 10*9/L Final   ??? RBC 06/04/2021 3.08 (A) 3.95 - 5.13 10*12/L Final   ??? HGB 06/04/2021 7.6 (A) 11.3 - 14.9 g/dL Final   ??? HCT 60/09/9322 24.2 (A) 34.0 - 44.0 % Final   ??? MCV 06/04/2021 78.6  77.6 - 95.7 fL Final   ??? MCH 06/04/2021 24.5 (A) 25.9 - 32.4 pg Final   ??? MCHC 06/04/2021 31.2 (A) 32.0 - 36.0 g/dL Final   ??? RDW 55/73/2202 19.0 (A) 12.2 - 15.2 % Final   ???  MPV 06/04/2021 7.7  6.8 - 10.7 fL Final   ??? Platelet 06/04/2021 287  150 - 450 10*9/L Final   ??? Sodium 06/04/2021 137  135 - 145 mmol/L Final   ??? Potassium 06/04/2021 5.2 (A) 3.4 - 4.8 mmol/L Final   ??? Chloride 06/04/2021 102  98 - 107 mmol/L Final   ??? CO2 06/04/2021 28.9  20.0 - 31.0 mmol/L Final   ??? Anion Gap 06/04/2021 6  5 - 14 mmol/L Final   ??? BUN 06/04/2021 40 (A) 9 - 23 mg/dL Final   ??? Creatinine 06/04/2021 2.07 (A) 0.60 - 0.80 mg/dL Final   ??? BUN/Creatinine Ratio 06/04/2021 19   Final   ??? eGFR [name]-EPI (2021) Female 06/04/2021 24 (A) >=60 mL/min/1.32m2 Final   ??? Glucose 06/04/2021 108  70 - 179 mg/dL Final   ??? Calcium 16/09/9603 8.1 (A) 8.7 - 10.4 mg/dL Final   ??? Albumin 54/08/8118 3.5  3.4 - 5.0 g/dL Final   ??? Total Protein 06/04/2021 6.9  5.7 - 8.2 g/dL Final   ??? Total Bilirubin 06/04/2021 0.5  0.3 - 1.2 mg/dL Final   ??? AST 14/78/2956 10  <=34 U/L Final   ??? ALT 06/04/2021 <7 (A) 10 - 49 U/L Final   ??? Alkaline Phosphatase 06/04/2021 51  46 - 116 U/L Final   ??? Magnesium 06/04/2021 3.6 (A) 1.6 - 2.6 mg/dL Final   ??? Phosphorus 06/04/2021 5.3 (A) 2.4 - 5.1 mg/dL Final   ??? Glucose, POC 06/04/2021 107  70 - 179 mg/dL Final   ??? Glucose, POC 06/04/2021 198 (A) 70 - 179 mg/dL Final   Office Visit on 06/02/2021   Component Date Value Ref Range Status   ??? Sodium 06/02/2021 129 (A) 135 - 145 mmol/L Final   ??? Potassium 06/02/2021 4.7  3.4 - 4.8 mmol/L Final   ??? Chloride 06/02/2021 95 (A) 98 - 107 mmol/L Final   ??? CO2 06/02/2021 30.0  20.0 - 31.0 mmol/L Final   ??? Anion Gap 06/02/2021 4 (A) 5 - 14 mmol/L Final   ??? BUN 06/02/2021 46 (A) 9 - 23 mg/dL Final   ??? Creatinine 06/02/2021 2.59 (A) 0.60 - 0.80 mg/dL Final   ??? BUN/Creatinine Ratio 06/02/2021 18   Final   ??? eGFR [name]-EPI (2021) Female 06/02/2021 18 (A) >=60 mL/min/1.64m2 Final   ??? Glucose 06/02/2021 112  70 - 179 mg/dL Final   ??? Calcium 21/30/8657 8.6 (A) 8.7 - 10.4 mg/dL Final   ??? Hemoglobin A1C 06/02/2021 <3.8 (A) 4.8 - 5.6 % Final   ??? BNP 06/02/2021 462 (A) <=100 pg/mL Final   Office Visit on 05/28/2021   Component Date Value Ref Range Status   ??? Sodium 05/28/2021 134 (A) 135 - 145 mmol/L Final   ??? Potassium 05/28/2021 4.9 (A) 3.4 - 4.8 mmol/L Final   ??? Chloride 05/28/2021 98  98 - 107 mmol/L Final   ??? CO2 05/28/2021 29.6  20.0 - 31.0 mmol/L Final   ??? Anion Gap 05/28/2021 6  5 - 14 mmol/L Final   ??? BUN 05/28/2021 60 (A) 9 - 23 mg/dL Final   ??? Creatinine 05/28/2021 2.65 (A) 0.60 - 0.80 mg/dL Final   ??? BUN/Creatinine Ratio 05/28/2021 23   Final   ??? eGFR [name]-EPI (2021) Female 05/28/2021 18 (A) >=60 mL/min/1.15m2 Final   ??? Glucose 05/28/2021 152  70 - 179 mg/dL Final   ??? Calcium 84/69/6295 9.6  8.7 - 10.4 mg/dL Final   Hospital Outpatient Visit on 05/23/2021  Component Date Value Ref Range Status   ??? FVC PRE 05/23/2021 1.91  1.50 - 2.97 L Final   ??? FEV1 PRE 05/23/2021 1.12 (A) 1.13 - 2.23 L Final   ??? FEV1/FVC PRE 05/23/2021 58.38 (A) 63.24 - 89.78 % Final   ??? FEV6 PRE 05/23/2021 1.73  1.30 - 2.71 L Final   ??? FEV1/FEV6 PRE 05/23/2021 64.39 (A) 69.09 - 88.75 % Final   ??? FEF25-75% PRE 05/23/2021 0.40 (A) 0.46 - 2.91 L/s Final   ??? ISOFEF25-75 PRE 05/23/2021 0.40  L/s Final   ??? FEF50% PRE 05/23/2021 0.61 (A) 1.38 - 5.00 L/s Final   ??? PEF PRE 05/23/2021 3.48  2.05 - 6.10 L/s Final   ??? FET100% Change 05/23/2021 11.55  sec Final   ??? FIVC PRE 05/23/2021 1.72  1.50 - 2.97 L Final   ??? FIF50% PRED 05/23/2021 2.66  L/s Final   ??? UJW/JXB14 pre 05/23/2021 43.23  % Final   ??? PIF PRE 05/23/2021 3.08  1.55 - 6.35 L/s Final   ??? Vol extrap pre 05/23/2021 0.04  L Final   Office Visit on 05/06/2021   Component Date Value Ref Range Status   ??? Color, UA 05/06/2021 Yellow   Final   ??? Clarity, UA 05/06/2021 Clear   Final   ??? Specific Gravity, UA 05/06/2021 1.010  1.005 - 1.030 Final   ??? pH, UA 05/06/2021 6.5  5.0 - 9.0 Final   ??? Leukocyte Esterase, UA 05/06/2021 Negative  Negative Final   ??? Nitrite, UA 05/06/2021 Negative  Negative Final   ??? Protein, UA 05/06/2021 Negative  Negative Final   ??? Glucose, UA 05/06/2021 Negative  Negative Final   ??? Ketones, UA 05/06/2021 Negative  Negative Final   ??? Urobilinogen, UA 05/06/2021 0.2 mg/dL  0.2 - 2.0 mg/dL Final   ??? Bilirubin, UA 05/06/2021 Negative  Negative Final   ??? Blood, UA 05/06/2021 Negative  Negative Final   ??? RBC, UA 05/06/2021 <1  0 - 3 /HPF Final   ??? WBC, UA 05/06/2021 <1  0 - 3 /HPF Final   ??? Squam Epithel, UA 05/06/2021 5  0 - 5 /HPF Final   ??? Bacteria, UA 05/06/2021 Many (A) None Seen /HPF Final   ??? Hyaline Casts, UA 05/06/2021 17 (A) <=0 /LPF Final   ??? Urine Culture, Comprehensive 05/06/2021 50,000 to 100,000 CFU/mL Vancomycin Resistant Enterococcus faecium (A)  Final   Admission on 04/23/2021, Discharged on 04/25/2021   Component Date Value Ref Range Status   ??? SARS-CoV-2 PCR 04/23/2021 Negative  Negative Final   ??? Influenza A 04/23/2021 Negative  Negative Final   ??? Influenza B 04/23/2021 Negative  Negative Final   ??? RSV 04/23/2021 Negative  Negative Final   ??? hsTroponin I 04/23/2021 35 (A) <=34 ng/L Final   ??? EKG Ventricular Rate 04/23/2021 70  BPM Final   ??? EKG Atrial Rate 04/23/2021 70  BPM Final   ??? EKG P-R Interval 04/23/2021 182  ms Final   ??? EKG QRS Duration 04/23/2021 88  ms Final   ??? EKG Q-T Interval 04/23/2021 468  ms Final   ??? EKG QTC Calculation 04/23/2021 505  ms Final   ??? EKG Calculated P Axis 04/23/2021 62  degrees Final   ??? EKG Calculated R Axis 04/23/2021 44  degrees Final   ??? EKG Calculated T Axis 04/23/2021 49  degrees Final   ??? QTC Fredericia 04/23/2021 492  ms Final   ??? BNP 04/23/2021 566.69 (A) <=100 pg/mL  Final   ??? Sodium 04/23/2021 137  135 - 145 mmol/L Final   ??? Potassium 04/23/2021 4.1  3.4 - 4.8 mmol/L Final   ??? Chloride 04/23/2021 101  98 - 107 mmol/L Final   ??? Anion Gap 04/23/2021 5  5 - 14 mmol/L Final   ??? CO2 04/23/2021 30.9  20.0 - 31.0 mmol/L Final   ??? BUN 04/23/2021 32 (A) 9 - 23 mg/dL Final   ??? Creatinine 04/23/2021 1.49 (A) 0.60 - 0.80 mg/dL Final   ??? BUN/Creatinine Ratio 04/23/2021 21   Final   ??? EGFR [name]-EPI Non-African American,* 04/23/2021 33 (A) >=60 mL/min/1.12m2 Final   ??? EGFR [name]-EPI African American, Fem* 04/23/2021 38 (A) >=60 mL/min/1.41m2 Final   ??? Glucose 04/23/2021 185 (A) 70 - 179 mg/dL Final   ??? Calcium 16/09/9603 8.7  8.7 - 10.4 mg/dL Final   ??? Albumin 54/08/8118 3.3 (A) 3.4 - 5.0 g/dL Final   ??? Total Protein 04/23/2021 7.5  5.7 - 8.2 g/dL Final   ??? Total Bilirubin 04/23/2021 0.4  0.3 - 1.2 mg/dL Final   ??? AST 14/78/2956 14  <=34 U/L Final   ??? ALT 04/23/2021 11  10 - 49 U/L Final   ??? Alkaline Phosphatase 04/23/2021 75  46 - 116 U/L Final   ??? Magnesium 04/23/2021 2.3  1.6 - 2.6 mg/dL Final   ??? WBC 21/30/8657 11.2  3.6 - 11.2 10*9/L Final   ??? RBC 04/23/2021 3.13 (A) 3.95 - 5.13 10*12/L Final   ??? HGB 04/23/2021 8.2 (A) 11.3 - 14.9 g/dL Final   ??? HCT 84/69/6295 26.6 (A) 34.0 - 44.0 % Final   ??? MCV 04/23/2021 84.9  77.6 - 95.7 fL Final   ??? MCH 04/23/2021 26.3  25.9 - 32.4 pg Final   ??? MCHC 04/23/2021 31.0 (A) 32.0 - 36.0 g/dL Final   ??? RDW 28/41/3244 17.4 (A) 12.2 - 15.2 % Final ??? MPV 04/23/2021 7.9  6.8 - 10.7 fL Final   ??? Platelet 04/23/2021 281  150 - 450 10*9/L Final   ??? nRBC 04/23/2021 0  <=4 /100 WBCs Final   ??? Neutrophils % 04/23/2021 82.0  % Final   ??? Lymphocytes % 04/23/2021 9.2  % Final   ??? Monocytes % 04/23/2021 6.2  % Final   ??? Eosinophils % 04/23/2021 1.7  % Final   ??? Basophils % 04/23/2021 0.9  % Final   ??? Absolute Neutrophils 04/23/2021 9.2 (A) 1.8 - 7.8 10*9/L Final   ??? Absolute Lymphocytes 04/23/2021 1.0 (A) 1.1 - 3.6 10*9/L Final   ??? Absolute Monocytes 04/23/2021 0.7  0.3 - 0.8 10*9/L Final   ??? Absolute Eosinophils 04/23/2021 0.2  0.0 - 0.5 10*9/L Final   ??? Absolute Basophils 04/23/2021 0.1  0.0 - 0.1 10*9/L Final   ??? Anisocytosis 04/23/2021 Slight (A) Not Present Final   ??? hsTroponin I 04/23/2021 37 (A) <=34 ng/L Final   ??? delta hsTroponin I 04/23/2021 2  <=7 ng/L Final   ??? Specimen Source 04/23/2021 Venous   Final   ??? FIO2 Venous 04/23/2021 Room Air   Final   ??? pH, Venous 04/23/2021 7.42  7.32 - 7.43 Final   ??? pCO2, Ven 04/23/2021 50  40 - 60 mm Hg Final   ??? pO2, Ven 04/23/2021 60 (A) 30 - 55 mm Hg Final   ??? HCO3, Ven 04/23/2021 30 (A) 22 - 27 mmol/L Final   ??? Base Excess, Ven 04/23/2021 7.4 (A) -2.0 - 2.0 Final   ??? O2  Saturation, Venous 04/23/2021 90.7 (A) 40.0 - 85.0 % Final   ??? Sodium Whole Blood 04/23/2021 141  135 - 145 mmol/L Final   ??? Potassium, Bld 04/23/2021 4.3  3.4 - 4.6 mmol/L Final   ??? Calcium, Ionized Venous 04/23/2021 4.70  4.40 - 5.40 mg/dL Final   ??? Glucose Whole Blood 04/23/2021 183  Undefined mg/dL Final   ??? Lactate, Venous 04/23/2021 0.8  0.5 - 1.8 mmol/L Final   ??? Hgb, blood gas 04/23/2021 8.60 (A) 12.00 - 16.00 g/dL Final   ??? hsTroponin I 04/23/2021 43 (A) <=34 ng/L Final   ??? Sodium 04/24/2021 140  135 - 145 mmol/L Final   ??? Potassium 04/24/2021 4.0  3.4 - 4.8 mmol/L Final   ??? Chloride 04/24/2021 100  98 - 107 mmol/L Final   ??? CO2 04/24/2021 34.3 (A) 20.0 - 31.0 mmol/L Final   ??? Anion Gap 04/24/2021 6  5 - 14 mmol/L Final   ??? BUN 04/24/2021 31 (A) 9 - 23 mg/dL Final   ??? Creatinine 04/24/2021 1.39 (A) 0.60 - 0.80 mg/dL Final   ??? BUN/Creatinine Ratio 04/24/2021 22   Final   ??? EGFR [name]-EPI Non-African American,* 04/24/2021 36 (A) >=60 mL/min/1.73m2 Final   ??? EGFR [name]-EPI African American, Fem* 04/24/2021 42 (A) >=60 mL/min/1.54m2 Final   ??? Glucose 04/24/2021 117  70 - 179 mg/dL Final   ??? Calcium 16/09/9603 8.9  8.7 - 10.4 mg/dL Final   ??? Magnesium 54/08/8118 2.1  1.6 - 2.6 mg/dL Final   ??? WBC 14/78/2956 11.0  3.6 - 11.2 10*9/L Final   ??? RBC 04/24/2021 3.14 (A) 3.95 - 5.13 10*12/L Final   ??? HGB 04/24/2021 8.5 (A) 11.3 - 14.9 g/dL Final   ??? HCT 21/30/8657 26.4 (A) 34.0 - 44.0 % Final   ??? MCV 04/24/2021 84.0  77.6 - 95.7 fL Final   ??? MCH 04/24/2021 27.0  25.9 - 32.4 pg Final   ??? MCHC 04/24/2021 32.1  32.0 - 36.0 g/dL Final   ??? RDW 84/69/6295 18.2 (A) 12.2 - 15.2 % Final   ??? MPV 04/24/2021 8.2  6.8 - 10.7 fL Final   ??? Platelet 04/24/2021 302  150 - 450 10*9/L Final   ??? hsTroponin I 04/24/2021 39 (A) <=34 ng/L Final   ??? Sodium 04/25/2021 139  135 - 145 mmol/L Final   ??? Potassium 04/25/2021 4.0  3.4 - 4.8 mmol/L Final   ??? Chloride 04/25/2021 99  98 - 107 mmol/L Final   ??? CO2 04/25/2021 33.0 (A) 20.0 - 31.0 mmol/L Final   ??? Anion Gap 04/25/2021 7  5 - 14 mmol/L Final   ??? BUN 04/25/2021 31 (A) 9 - 23 mg/dL Final   ??? Creatinine 04/25/2021 1.42 (A) 0.60 - 0.80 mg/dL Final   ??? BUN/Creatinine Ratio 04/25/2021 22   Final   ??? EGFR [name]-EPI Non-African American,* 04/25/2021 35 (A) >=60 mL/min/1.81m2 Final   ??? EGFR [name]-EPI African American, Fem* 04/25/2021 41 (A) >=60 mL/min/1.39m2 Final   ??? Glucose 04/25/2021 148  70 - 179 mg/dL Final   ??? Calcium 28/41/3244 8.8  8.7 - 10.4 mg/dL Final   ??? Magnesium 12/30/7251 2.2  1.6 - 2.6 mg/dL Final   ??? WBC 66/44/0347 11.8 (A) 3.6 - 11.2 10*9/L Final   ??? RBC 04/25/2021 3.50 (A) 3.95 - 5.13 10*12/L Final   ??? HGB 04/25/2021 9.3 (A) 11.3 - 14.9 g/dL Final   ??? HCT 42/59/5638 29.5 (A) 34.0 - 44.0 % Final   ??? MCV  04/25/2021 84.2  77.6 - 95.7 fL Final   ??? MCH 04/25/2021 26.5  25.9 - 32.4 pg Final   ??? MCHC 04/25/2021 31.5 (A) 32.0 - 36.0 g/dL Final   ??? RDW 16/09/9603 18.4 (A) 12.2 - 15.2 % Final   ??? MPV 04/25/2021 8.0  6.8 - 10.7 fL Final   ??? Platelet 04/25/2021 332  150 - 450 10*9/L Final   There may be more visits with results that are not included.       Lab Results   Component Value Date    PRO-BNP 905.0 (H) 03/21/2021    PRO-BNP 3,796.0 (H) 12/23/2020    PRO-BNP 1,970.0 (H) 05/30/2020    Creatinine 2.32 (H) 06/23/2021    Creatinine 2.47 (H) 06/18/2021    Creatinine 0.79 01/06/2011    BUN 36 (H) 06/23/2021    BUN 36 (H) 06/18/2021    BUN 20 01/06/2011    Sodium 137 06/23/2021    Sodium 140 01/06/2011    Potassium 4.8 06/23/2021    Potassium 4.1 01/06/2011    CO2 28.6 06/23/2021    CO2 30 01/06/2011    Magnesium 3.6 (H) 06/17/2021    Magnesium 2.0 01/06/2011    Total Bilirubin 0.4 06/18/2021    INR 2.46 06/18/2021    INR 1.0 01/06/2011       Lab Results   Component Value Date    Digoxin Level 0.9 10/01/2020       Lab Results   Component Value Date    TSH 2.122 06/17/2021    Cholesterol 132 08/23/2020    Triglycerides 109 08/23/2020    HDL 32 (L) 08/23/2020    Non-HDL Cholesterol 100 08/23/2020    LDL Calculated 78 08/23/2020       Lab Results   Component Value Date    WBC 6.6 06/23/2021    WBC 12.5 (H) 01/06/2011    HGB 8.5 (L) 06/23/2021    HGB 12.9 01/06/2011    HCT 27.2 (L) 06/23/2021    HCT 40.8 01/06/2011    Platelet 309 06/23/2021    Platelet 260 01/06/2011       Pertinent Test Results from Today:  None    Other pertinent records were reviewed.    The following are further history from the patient's EPIC record for reference:     Past Medical History:   Diagnosis Date   ??? Acute kidney injury superimposed on chronic kidney disease (CMS-HCC) 10/11/2020   ??? Acute on chronic diastolic (congestive) heart failure (CMS-HCC) 08/23/2020   ??? Arthritis    ??? Calculus of kidney    ??? Calculus of ureter    ??? CHF (congestive heart failure) (CMS-HCC)    ??? Chronic atrial fibrillation (CMS-HCC) 07/20/2019   ??? COPD (chronic obstructive pulmonary disease) (CMS-HCC)    ??? Diabetes (CMS-HCC)    ??? Gangrenous cholecystitis 10/11/2020   ??? GERD (gastroesophageal reflux disease)    ??? Hydronephrosis    ??? Hypertension    ??? Hyponatremia 10/11/2020   ??? Intermediate coronary syndrome (CMS-HCC) 03/13/2014   ??? Lower extremity edema 09/28/2020   ??? Lumbar stenosis    ??? Microscopic hematuria    ??? Nausea alone    ??? Nephrolithiasis 04/17/2016   ??? Neuropathy    ??? Nocturia    ??? Other chronic cystitis    ??? Pulmonary hypertension (CMS-HCC)    ??? Renal colic    ??? Sleep apnea    ??? Unstable angina pectoris (CMS-HCC) 03/13/2014  Past Surgical History:   Procedure Laterality Date   ??? BACK SURGERY  1995   ??? CARPAL TUNNEL RELEASE Left 2014   ??? HYSTERECTOMY  1971   ??? IR INSERT CHOLECYSTOSMY TUBE PERCUTANEOUS  10/02/2020    IR INSERT CHOLECYSTOSMY TUBE PERCUTANEOUS 10/02/2020 Braulio Conte, MD IMG VIR H&V [facility]   ??? LUMBAR DISC SURGERY     ??? PR REMOVAL GALLBLADDER N/A 10/06/2020    Procedure: CHOLECYSTECTOMY;  Surgeon: Katherina Mires, MD;  Location: MAIN OR [facility];  Service: Trauma   ??? PR RIGHT HEART CATH O2 SATURATION & CARDIAC OUTPUT N/A 09/30/2020    Procedure: Right Heart Catheterization;  Surgeon: Neal Dy, MD;  Location: [facility] CATH;  Service: Cardiology   ??? PR RIGHT HEART CATH O2 SATURATION & CARDIAC OUTPUT N/A 01/09/2021    Procedure: Right Heart Catheterization;  Surgeon: Lesle Reek, MD;  Location: [facility] CATH;  Service: Cardiology           The patient reports they are currently: at home. I spent 12 minutes on the phone visit with the patient on the date of service. I spent an additional 10 minutes on pre- and post-visit activities on the date of service.     The patient was not located and I was located within 250 yards of a hospital based location during the phone visit. The patient was physically located in [place] or a state in which I am permitted to provide care. The patient and/or parent/guardian understood that s/he may incur co-pays and cost sharing, and agreed to the telemedicine visit. The visit was reasonable and appropriate under the circumstances given the patient's presentation at the time.    The patient and/or parent/guardian has been advised of the potential risks and limitations of this mode of treatment (including, but not limited to, the absence of in-person examination) and has agreed to be treated using telemedicine. The patient's/patient's family's questions regarding telemedicine have been answered.    If the visit was completed in an ambulatory setting, the patient and/or parent/guardian has also been advised to contact their provider???s office for worsening conditions, and seek emergency medical treatment and/or call 911 if the patient deems either necessary.

## 2021-06-23 ENCOUNTER — Ambulatory Visit: Admit: 2021-06-23 | Discharge: 2021-06-24 | Payer: MEDICARE

## 2021-06-23 DIAGNOSIS — Z7901 Long term (current) use of anticoagulants: Principal | ICD-10-CM

## 2021-06-23 DIAGNOSIS — N3000 Acute cystitis without hematuria: Principal | ICD-10-CM

## 2021-06-23 DIAGNOSIS — N398 Other specified disorders of urinary system: Principal | ICD-10-CM

## 2021-06-23 DIAGNOSIS — R791 Abnormal coagulation profile: Principal | ICD-10-CM

## 2021-06-23 DIAGNOSIS — D500 Iron deficiency anemia secondary to blood loss (chronic): Principal | ICD-10-CM

## 2021-06-23 DIAGNOSIS — N179 Acute kidney failure, unspecified: Principal | ICD-10-CM

## 2021-06-23 DIAGNOSIS — D509 Iron deficiency anemia, unspecified: Principal | ICD-10-CM

## 2021-06-23 DIAGNOSIS — R93429 Abnormal radiologic findings on diagnostic imaging of unspecified kidney: Principal | ICD-10-CM

## 2021-06-23 DIAGNOSIS — N184 Chronic kidney disease, stage 4 (severe): Principal | ICD-10-CM

## 2021-06-23 LAB — CBC W/ AUTO DIFF
BASOPHILS ABSOLUTE COUNT: 0.1 10*9/L (ref 0.0–0.1)
BASOPHILS RELATIVE PERCENT: 1.5 %
EOSINOPHILS ABSOLUTE COUNT: 0.2 10*9/L (ref 0.0–0.5)
EOSINOPHILS RELATIVE PERCENT: 2.9 %
HEMATOCRIT: 27.2 % — ABNORMAL LOW (ref 34.0–44.0)
HEMOGLOBIN: 8.5 g/dL — ABNORMAL LOW (ref 11.3–14.9)
LYMPHOCYTES ABSOLUTE COUNT: 1.3 10*9/L (ref 1.1–3.6)
LYMPHOCYTES RELATIVE PERCENT: 19.4 %
MEAN CORPUSCULAR HEMOGLOBIN CONC: 31.1 g/dL — ABNORMAL LOW (ref 32.0–36.0)
MEAN CORPUSCULAR HEMOGLOBIN: 24.1 pg — ABNORMAL LOW (ref 25.9–32.4)
MEAN CORPUSCULAR VOLUME: 77.4 fL — ABNORMAL LOW (ref 77.6–95.7)
MEAN PLATELET VOLUME: 7.9 fL (ref 6.8–10.7)
MONOCYTES ABSOLUTE COUNT: 0.9 10*9/L — ABNORMAL HIGH (ref 0.3–0.8)
MONOCYTES RELATIVE PERCENT: 13.1 %
NEUTROPHILS ABSOLUTE COUNT: 4.1 10*9/L (ref 1.8–7.8)
NEUTROPHILS RELATIVE PERCENT: 63.1 %
NUCLEATED RED BLOOD CELLS: 0 /100{WBCs} (ref <=4)
PLATELET COUNT: 309 10*9/L (ref 150–450)
RED BLOOD CELL COUNT: 3.51 10*12/L — ABNORMAL LOW (ref 3.95–5.13)
RED CELL DISTRIBUTION WIDTH: 21.7 % — ABNORMAL HIGH (ref 12.2–15.2)
WBC ADJUSTED: 6.6 10*9/L (ref 3.6–11.2)

## 2021-06-23 LAB — BASIC METABOLIC PANEL
ANION GAP: 8 mmol/L (ref 5–14)
BLOOD UREA NITROGEN: 36 mg/dL — ABNORMAL HIGH (ref 9–23)
BUN / CREAT RATIO: 16
CALCIUM: 8.8 mg/dL (ref 8.7–10.4)
CHLORIDE: 100 mmol/L (ref 98–107)
CO2: 28.6 mmol/L (ref 20.0–31.0)
CREATININE: 2.32 mg/dL — ABNORMAL HIGH
EGFR CKD-EPI (2021) FEMALE: 21 mL/min/{1.73_m2} — ABNORMAL LOW (ref >=60)
GLUCOSE RANDOM: 142 mg/dL (ref 70–179)
POTASSIUM: 4.8 mmol/L (ref 3.4–4.8)
SODIUM: 137 mmol/L (ref 135–145)

## 2021-06-23 NOTE — Unmapped (Signed)
-   continue fosfomycin prophylaxis  - continue vaginal estrogen  - cranberry supplements for uti prevention  - Also discussed preventative measures including, but not limited to, increased water intake 6-8 glasses/day, voiding pre/post intercourse, cotton underwear, avoiding OTC creams, preventing/managing constipation, personal hygiene measures, voiding when feel the urge, etc.  - avoid constipation  - vaginal exam/ ct/ cysto

## 2021-06-23 NOTE — Unmapped (Signed)
[facility] UROLOGY [facility]  [address]  [place], [address]    Office Visit    Patient Name: Barbara Huber    Date of  Visit: 06/23/2021     Date of Birth: 1941-06-07    Assessment/Plan:     This is a very pleasant 80 y.o. female with history of recurrent UTI seen and followed by Infectious disease, Dr. Kathaleen Grinder currently on fosfomycin prophylaxis, cranberry, and vaginal estrogen. Prior CT from 12/2020 with right urothelial thickening, possibly in the setting on UTI. NO recurrence of UTI since starting fosfomycin prophylaxis mid June. She does have some incomplete emptying, but will continue timed and double voiding. (very very mild rectocele on BME today).     UTI counseling:  We talked at length about UTIs and prevention including: frequenty hydration (>1.5L/day), avoiding constipation, urination after intercourse and avoiding spermacidal agents, wiping front to back, pelvic floor PT in the presence of voiding dysfunction, and prophylactic measures including D-mannose, cranberry supplements (36 mg PAC/day), hipprex/vitamin C, and low dose antibiotics.    - continue fosfomycin prophylaxis  - continue vaginal estrogen  - cranberry supplements for uti prevention  - Also discussed preventative measures including, but not limited to, increased water intake 6-8 glasses/day, voiding pre/post intercourse, cotton underwear, avoiding OTC creams, preventing/managing constipation, personal hygiene measures, voiding when feel the urge, etc.  - avoid constipation  -  ct/ cysto     The primary encounter diagnosis was Urothelial lesion. Diagnoses of Acute cystitis without hematuria, Abnormal CT scan, kidney, Iron deficiency anemia due to chronic blood loss, and AKI (acute kidney injury) (CMS-HCC) were also pertinent to this visit.  Orders Placed This Encounter   Procedures   ??? CT Abdomen Pelvis Wo Contrast (Stone Protocol)   ??? Measure post void residual       History of Present Illness:     Chief Complaint:   Chief Complaint Patient presents with   ??? Hematuria     From Dr. Achilles Dunk in 2018:   The patient is a 80 y.o. female who presents today for evaluation of  Chronic cystitis and nocturia. She was recently treated for a bladder infection. She presented to the emergency room at [facility] with shortness of breath. The urinalysis at that time was nitrite positive. No significant WBCs or RBCs were noted. A subsequent urine culture however grew Escherichia coli. No significant bacteria were noted on the urinalysis. She was diagnosed as having a urinary tract infection based on the nitrite positive urine. She states that she was having shortness of breath with chills. She states she just felt bad. She was treated with a course of antibiotic therapy with subsequent improvement in her overall status. This is actually the first true bladder infection that she has had in approximately one year. She has been utilizing the suppression antibiotic with overall good results. Discussion was undertaken regarding this aspect. She denies any current dysuria or hematuria. She denies any vaginal discharge or prolapse. She denies any increased frequency or urgency. She denies any stress or urge component urinary incontinence. She is utilizing no pads. She has been experiencing every hour nocturia. Her symptoms are relatively stable during the daytime. She does have moderate lower extremity edema. Nighttime diuresis and sleep issues must be considered in this situation. Underlying sleep apnea and other factors was also be considered. She states that she was given Klonopin to help with sleep. If she takes one half of the pill, she will have significant decrease in nocturia. This is  also more consistent with an underlying sleep issue. The use of melatonin, Benadryl, and other conservative options were discussed. A sleep study may also be beneficial. She denies any change in bowel function. She does relate back pain. Recent imaging demonstrated no evidence of stone. Colonization versus infection differences were discussed in detail. Today's urine is once again nitrite positive. She is relatively asymptomatic at this point. Factors that can lead to breakthrough infections were also discussed. Ongoing use of the suppression antibiotic was also discussed. The use of topical estrogen therapy could be considered. Given her body habitus and other factors, we will hold on this at present. Her daughter was present for today's evaluation. Extensive discussion was undertaken regarding appropriate antibiotic use. They understand the situation. They deny any other significant change since her last visit.        Interval 06/23/21:   After review of records, this patient was re-referred to [place] in the urology clinic for history of recurrent UTI, with about 5 symptomatic culture positive UTIs within the last 6 months. CT with assymmetric right urothelial thickening 12/2020 but in the setting of likely a UTI?     Per ED she started fosfomycin 06/10/21 (has maybe had three doses), no infections since. Typical infection type symptoms are: chills, urinary frequency, fevers, foul smelling urine (no typical flank pain), no hematuria.     Notes that this truly that these recurrent UTIs started at the beginning of this year. (previously was seeing Dr. Achilles Dunk for a couple of years for recurrent UTI and appreciates that they subsided for a time after about 2018;  previously on doxycycline prophylaxis.)    In addition to fosfomycin, she also takes vaginal estrogen nightly and she's taking cranberry supplements as well.     Daily bm with help of laxative.     G2, vaginal deliveries    Caffeine: 1 cup/ day.     06/18/21:  > 100 k  Enterococcus/ strep bovis    06/03/21: Mixed Gram +/ Gram -  05/06/21 50-100 k VRE S Doxy   04/18/21 Negative   02/27/21 > 100 K E coli pan sens   02/05/21 50-100 K E coli pan sens   01/21/21 > 100 K E COl pan sens, > 100 K enterococcus pan sens, > 100 k aerococcus sus to pen/ amp/ vanc/ doxy    UA negative  PVR 323 mls, 147 mls     --- From ID Dr. Kathaleen Grinder:     Already doing many of the behavioral preventative measures to help prevent UTIs, and planning to re-establish with Urology at [facility] at the end of the month. DM well controlled on metformin (last A1c 5.5% on 04/04/2021). No issues with urinary incontinence at baseline. H/o nephrolithiasis but negative CT in Jan 2022. It seems like she is having truly symptomatic UTIs (>5 in the last year) that have led to 2 hospitalizations in the last 6 months. Therefore, I think a re-trial of a prophylactic antibiotic would be reasonable. Previously tolerated doxycycline back in 2017-2018, but as she has grown an MDR E.coli resistant to tetracyclines in the last 6 months, would recommend fosfomycin ppx. This will also cover the VRE she grew in 04/2021 (although should be noted that she was asymptomatic at the time of this culture. We discussed risks and benefits of further development of resistance and both her and her daughter would like to proceed.  ??  Re: itching - no rash visible on exam. Will check BMP to r/o uremia.   ??  RECOMMENDATIONS:  - START fosfomycin 3g weekly.  - Continue estrogen cream and cranberry supplement as prescribed by Urology in [facility] Urology appt as scheduled for end of the month  - Follow-up by phone visit in 1 month, then in person in 3 months.  - Check BMP today to monitor Cr and for work-up of itching.  - Avoid benadryl as on [place] list, cetirizine would be reasonable alternative  - Recommend follow-up with PCP re: itching.  ??Most recently hospitalized in April 2022 for sepsis 2/2 complicated UTI (symptomatic with hesitancy, fevers, chills, and pyuria). Treated with IV ceftriaxone -->cefpodoxime x 7 days. Urine culture was no growth, but per pt had recently been on amox/clav (although no record of this in our system). Admitted a few days later for CHF exacerbation, treated with cefdinir inpatient, then completed cefpodoxime course at discharge. At hospital follow-up visit on 5/10, she insisted on repeat U/A + reflex culture for clearance despite provider recommending against this. U/A without pyuria, LE or nitrites, but reflexed (possibly because of many bacteria) and grew VRE. As she was asymptomatic, no treatment was given and an e-consult was made to ID for recurrent UTI, for which a full consult in clinic was recommended.  ??    UTI history from ID:   In regard to her UTI history, she has been hospitalized twice in the last year for UTI specifically. She has had 6-7 symptomatic UTIs total in the last year. Her usual symptoms include urinary frequency, sometimes urgency, sometimes, dysuria, and foul-smelling urine. Sometimes, when she needs to be hospitalized, she will develop fevers and shaking. Usually comes on all of a sudden.  ??  For prevention, she is using estrogen cream and is taking a cranberry supplement per her urologist in [place]. She wipes front to back with pampers wipes. Wears cotton underwear. Denies incontinence. Her risk factors include history of nephrolithiasis and DM (although not issues recently). Has been on doxycycline for prophylaxis back in 2017-2018 per [facility] and saw Dr. Sampson Goon (ID in [place]) once back in 2017.  ??  Recent cultures grew E.coli, susceptible to all abx tested (02/26/21) --> treated with TMP/sulfa (02/26/21) and cefdinir (02/05/21). U/As consistent with infection with both these instances. Also grew E.coli, Aerococcus urinae, and Enterococcus faecalis from the same UCx when hospitalized for sepsis in Jan 2022. Has a history of MDR E.coli (S: to carbapenems, gentamicin, tobramycin, and nitrofurantoin) 01/03/21, 09/28/20, 08/03/20 and Klebsiella oxytoca in 04/2020 (S to all tested except R to ampicillin, I to cefazolin). In remote past has grown Proteus mirabilis and E.coli with a different susceptibility pattern.  ??  Currently, she denies any dysuria, fevers, frequency, urgency, or suprapubic pain    Allergies  Nitrofurantoin and Lipitor [atorvastatin]    Medications    Outpatient Encounter Medications as of 06/23/2021   Medication Sig Dispense Refill   ??? acetaminophen (TYLENOL) 500 MG tablet Take 1,000 mg by mouth daily as needed for pain.      ??? albuterol HFA 90 mcg/actuation inhaler Inhale 2 puffs every eight (8) hours as needed for wheezing.     ??? amiodarone (PACERONE) 200 MG tablet Take 1 tablet (200 mg total) by mouth daily. 30 tablet 1   ??? apixaban (ELIQUIS) 5 mg Tab Take 1 tablet (5 mg total) by mouth Two (2) times a day. 60 tablet 11   ??? estradioL (ESTRACE) 0.01 % (0.1 mg/gram) vaginal cream INSERT ONE GRAM VAGINALLY AT BEDTIME     ???  ferrous sulfate 325 (65 FE) MG tablet Take 1 tablet (325 mg total) by mouth every other day. 30 tablet 3   ??? fluticasone propion-salmeteroL (ADVAIR HFA) 115-21 mcg/actuation inhaler Inhale 2 puffs Two (2) times a day. 12 g 11   ??? fosfomycin (MONUROL) 3 gram Pack Take 3 g by mouth once a week for 12 doses. 36 g 0   ??? gabapentin (NEURONTIN) 300 MG capsule Take 1 capsule (300 mg total) by mouth Three (3) times a day. 90 capsule 0   ??? montelukast (SINGULAIR) 10 mg tablet Take 1 tablet (10 mg total) by mouth daily. 90 tablet 3   ??? NARCAN 4 mg/actuation nasal spray 1 spray into alternating nostrils once as needed (opioid overdose).     ??? oxyCODONE (OXYCONTIN) 10 mg TR12 12 hr crush resistant ER/CR tablet Take 10 mg by mouth every twelve (12) hours.     ??? OXYGEN-AIR DELIVERY SYSTEMS MISC 5 L by Miscellaneous route. 3 L/min via [place]     ??? pantoprazole (PROTONIX) 20 MG tablet Take 1 tablet (20 mg total) by mouth in the morning. 60 tablet 5   ??? rosuvastatin (CRESTOR) 5 MG tablet Take 1 tablet (5 mg total) by mouth every other day. 3015 tablet 11   ??? SITagliptin (JANUVIA) 25 MG tablet Take 1 tablet (25 mg total) by mouth in the morning. 30 tablet 11   ??? spironolactone (ALDACTONE) 50 MG tablet Take 1 tablet (50 mg total) by mouth in the morning. 30 tablet 0   ??? tafamidis 61 mg cap Take 1 capsule (61 mg) by mouth daily. 30 capsule 11   ??? tiotropium bromide (SPIRIVA RESPIMAT) 2.5 mcg/actuation inhalation mist Inhale 2 puffs daily. 4 g 11   ??? torsemide (DEMADEX) 20 MG tablet Take 1 tablet (20 mg total) by mouth daily. May also take 1 tablet (20 mg total) daily as needed (weight gain). 60 tablet 11   ??? varicella-zoster [id number], PF, (SHINGRIX, PF,) 50 mcg/0.5 mL SusR injection Inject 0.5 mL into the muscle. 0.5 mL 1   ??? ACCU-CHEK AVIVA PLUS TEST STRP Strp USE TO CHECK BLOOD SUGAR 3 TIMES A DAY BEFORE MEALS     ??? lancets Misc 1 each by Miscellaneous route daily. Accu Check 100 each 6     No facility-administered encounter medications on file as of 06/23/2021.       Past Medical History  Past Medical History:   Diagnosis Date   ??? Acute kidney injury superimposed on chronic kidney disease (CMS-HCC) 10/11/2020   ??? Acute on chronic diastolic (congestive) heart failure (CMS-HCC) 08/23/2020   ??? Arthritis    ??? Calculus of kidney    ??? Calculus of ureter    ??? CHF (congestive heart failure) (CMS-HCC)    ??? Chronic atrial fibrillation (CMS-HCC) 07/20/2019   ??? COPD (chronic obstructive pulmonary disease) (CMS-HCC)    ??? Diabetes (CMS-HCC)    ??? Gangrenous cholecystitis 10/11/2020   ??? GERD (gastroesophageal reflux disease)    ??? Hydronephrosis    ??? Hypertension    ??? Hyponatremia 10/11/2020   ??? Intermediate coronary syndrome (CMS-HCC) 03/13/2014   ??? Lower extremity edema 09/28/2020   ??? Lumbar stenosis    ??? Microscopic hematuria    ??? Nausea alone    ??? Nephrolithiasis 04/17/2016   ??? Neuropathy    ??? Nocturia    ??? Other chronic cystitis    ??? Pulmonary hypertension (CMS-HCC)    ??? Renal colic    ??? Sleep apnea    ???  Unstable angina pectoris (CMS-HCC) 03/13/2014       Past Surgical History  Past Surgical History:   Procedure Laterality Date   ??? BACK SURGERY  1995   ??? CARPAL TUNNEL RELEASE Left 2014   ??? HYSTERECTOMY  1971   ??? IR INSERT CHOLECYSTOSMY TUBE PERCUTANEOUS  10/02/2020    IR INSERT CHOLECYSTOSMY TUBE PERCUTANEOUS 10/02/2020 Braulio Conte, MD IMG VIR [facility]   ??? LUMBAR DISC SURGERY     ??? PR REMOVAL GALLBLADDER N/A 10/06/2020    Procedure: CHOLECYSTECTOMY;  Surgeon: Katherina Mires, MD;  [facility]: MAIN OR [facility];  Service: Trauma   ??? PR RIGHT HEART CATH O2 SATURATION & CARDIAC OUTPUT N/A 09/30/2020    Procedure: Right Heart Catheterization;  Surgeon: Neal Dy, MD;  Location: [facility] CATH;  Service: Cardiology   ??? PR RIGHT HEART CATH O2 SATURATION & CARDIAC OUTPUT N/A 01/09/2021    Procedure: Right Heart Catheterization;  Surgeon: Lesle Reek, MD;  Location: [facility] CATH;  Service: Cardiology       Family History  family history includes Cancer in her father; Hypertension in her mother.    Social History:  Tobacco use:   reports that she quit smoking about 3 years ago. Her smoking use included cigarettes. She smoked 0.33 packs per day. She has never used smokeless tobacco.  Alcohol use:   reports no history of alcohol use.  Drug use:  reports no history of drug use.    Review of Systems  A 12 point review of systems was negative except for pertinent items noted in the HPI and on the patient intake form.  The form has been reviewed with the patient or caregiver.    Objective     Vital Signs  Vitals:    06/23/21 1036   BP: 137/62   Pulse: 67   Resp: 18   Temp: 36.9 ??C (98.5 ??F)   TempSrc: Temporal   SpO2: 94%       Physical Exam  Mental Status??- Alert. Cooperative, Consistent with stated age. Oriented ??4. Well-nourished, Well developed. Gait??- Broad-based, Slow and cautious, Use of assistive device. Hydration??- Well hydrated. Voice??- Normal.    Integumentary:  General Characteristics:??No rashes. Normal coloration of skin. Normal skin moisture.    Head and Neck:  Global Assessment??- atraumatic, normocephalic, atraumatic with no gross lesions.  No abnormal facial aesthetics. No abnormal movements.  Eyeball??- Bilateral??- Normal.  Sclera/Conjunctiva??- Bilateral??- normal.  Nose - symmetric. Nares??- symmetric.  Lips: Normal color, moist, no gross lesions.    Chest and Lung Exam:??  Chest - normal excursion with symmetric chest walls, quiet, even and easy respiratory effort with no use of accessory muscles.  No abnormal breath sounds noted. On nasal cannula.     Cardiovascular:??  Cardiovascular - regular rate and rhythm.    Abdomen:??  General: Soft, Nontender, No palpable mass.   Costovertebral angle tenderness: None.  No abnormal bowel sounds.    Female Genitourinary:  External genitalia without lesions, or discharge. Vaginal exam: vaginal walls pink, moist, without lesions. Thin fragile pink friable vaginal tissue.  No cystocele. Possible mild rectocele. NO leak with cough.     Neurologic:??  Motor and sensory grossly intact. ??Affect??- normal and appropriate. Speech??- Normal. Thought content??- Normal. Cognitive function??- Normal. Coordination??- Normal.     Musculoskeletal:??  Posture??- Normal.  Musculoskeletal strength and tone grossly normal.    Lymphatic:  No Generalized lymphadenopathy. Peripheral edema: None.    Test Results  Results for orders placed  or performed in visit on 06/23/21   POCT Urinalysis Dipstick   Result Value Ref Range    Spec Gravity/POC 1.020 1.003 - 1.030    PH/POC 7.5 5.0 - 9.0    Leuk Esterase/POC Negative Negative    Nitrite/POC Negative Negative    Protein/POC Negative Negative    UA Glucose/POC Negative Negative    Ketones, POC Negative Negative    Bilirubin/POC Negative Negative    Blood/POC Negative Negative    Urobilinogen/POC 0.2 0.2 - 1.0 mg/dL       Urine Microscopy:    Imaging:   CT Abdomen Pelvis Wo Contrast    Status: Final result       PACS Images    ??Show images for CT Abdomen Pelvis Wo Contrast      CT Abdomen Pelvis Wo Contrast  Order: [id number]   Status: Final result ??     Visible to patient: Yes (seen) ??     Next appt: 07/02/2021 at 03:00 PM in Infectious Diseases Melanee Left, MD) ??     0 Result Notes      Details    Reading Physician Reading Date Result Priority   Dagoberto Reef, MD  [phone] 01/23/2021    Gypsy Decant, MD  [phone] 01/23/2021    [facility] 01/23/2021      Narrative & Impression  EXAM: CT ABDOMEN PELVIS WO CONTRAST  DATE: 01/23/2021 10:54 AM  ACCESSION: [id number]  DICTATED: 01/23/2021 11:28 AM  INTERPRETATION LOCATION: [facility]  ??  CLINICAL INDICATION: reccurrent UTI, Evaluate bladder anatomy, evaluate for urteral stone    ??  COMPARISON: MR abdomen 10/01/2020 and CT abdomen pelvis 05/05/2016.  ??  TECHNIQUE: A spiral CT scan of the abdomen and pelvis was obtained without IV contrast from the lung bases through the pubic symphysis. Images were reconstructed in the axial plane. Coronal and sagittal reformatted images were also provided for further evaluation.  ??  FINDINGS:   ??  Evaluation of the solid organs and vasculature is limited in the absence of intravenous contrast.  ??  LOWER THORAX: Atelectasis of both lung bases without focal consolidation. The heart is enlarged with mild calcification of the mitral valve annulus.  ??  HEPATOBILIARY: No focal hepatic lesions. Sequelae of interval cholecystectomy. There is mild nonspecific dilation of the cystic duct remnant as well as a common bile duct measuring 0.9 cm in diameter. No obstructing stone or mass. No intrahepatic biliary ductal dilatation.    SPLEEN: Unremarkable.  PANCREAS: Unremarkable.  ??  ADRENALS: Unremarkable.  KIDNEYS/URETERS: Subcentimeter calcifications within the right renal pelvis are favored to be vascular in origin. No obstructing calculus. There is urothelial thickening throughout the right renal pelvis (3:50) with mild asymmetric dilation. No obvious hydronephrosis. No obstructing calculus is present.  Stable left renal cyst.  ??  BLADDER: Unremarkable.  PELVIC/REPRODUCTIVE ORGANS: The uterus is surgically absent. No adnexal mass.  ??  GI TRACT: Colonic diverticulosis without diverticulitis. No asymmetric bowel wall thickening or obstruction.  ??  PERITONEUM/RETROPERITONEUM AND MESENTERY: No free air or fluid.  LYMPH NODES: No enlarged lymph nodes.   VESSELS: Moderate atherosclerotic disease of the aorta without aneurysmal dilation. There is calcification of the right gonadal vein adjacent to the right mid ureter (3:69).  ??  BONES AND SOFT TISSUES: Surgical change of the ventral abdominal wall. Degenerative change of the spine with chronic compression deformity of L1. There is mild grade 1 anterolisthesis L5 on S1. Multiple chronic left rib fractures.   ??  ??  IMPRESSION:  -Asymmetric urothelial thickening with slight adjacent increased stranding of the right renal collecting system appears right more prominent compared to prior and this may represent chronic mild inflammatory changes. Evaluation for acute pyelonephritis/acute infection is limited given lack of intravenous contrast. Clinical correlation is recommended.  ??  -No obstructive uropathy or renal calculi.  ??  -Surgical sequelae of interval cholecystectomy.             Problem List  Patient Active Problem List   Diagnosis   ??? Spinal stenosis of lumbar region   ??? Thoracic or lumbosacral neuritis or radiculitis   ??? Lumbosacral spondylosis   ??? COPD (chronic obstructive pulmonary disease) (CMS-HCC)   ??? Sleep apnea in adult   ??? Essential hypertension   ??? AKI (acute kidney injury) (CMS-HCC)   ??? Type 2 diabetes mellitus with stage 3b chronic kidney disease, without long-term current use of insulin (CMS-HCC)   ??? Chronic cystitis   ??? Incomplete bladder emptying   ??? Nocturia   ??? Peripheral neuropathy   ??? Chronic respiratory failure with hypoxia (CMS-HCC)   ??? Anemia   ??? Persistent atrial fibrillation (CMS-HCC)   ??? Anemia in chronic kidney disease   ??? Chronic kidney disease, stage III (moderate) (CMS-HCC)   ??? Enrolled in chronic care management   ??? Pulmonary hypertension (CMS-HCC)   ??? Shortness of breath   ??? Chronic prescription opiate use   ??? Dyspnea on exertion   ??? 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 Cardiac amyloidosis (CMS-HCC)   ??? Recurrent cold sores   ??? Persistent fatigue after COVID-19   ??? Dyspepsia   ??? Iron deficiency anemia due to chronic blood loss   ??? Generalized edema        Dionicio Stall

## 2021-06-24 NOTE — Unmapped (Signed)
Hematology e-Consult    Reason for Consult   Consulted by: Artelia Laroche, MD    Clinical Question: Pt with cardiac amyloid receiving apixaban 5mg  po BID for atrial fibrillation, incidentally found to have PT/INR elevated (INR 2.4).  Is this degree of INR elevation expected with apixaban?    My clinical question is: This is a complex pt with cardiac amyloid, [name], DM anemia, COPD.       Assessment & Recommendations   #1 Elevated PT/INR while on apixaban for atrial fibrillation  #2 Iron deficiency anemia     Apixaban has a variable effect on PT/INR; PT/INR monitoring cannot be used for reliable monitoring of therapeutic/supratherapeutic apixaban drug levels.  The effect of apixaban on prolonging the PT/INR is generally minimal, and an INR of 2.4 is a bit higher than would be expected from apixaban alone (though there are reports of INRs of 3's on therapeutic apixaban).  Unfortunately, there is no baseline PT/INR just prior to the initiation of apixaban for comparison.    There are several issues here:  1) Does the patient require ongoing anticoagulation?  It appears that she has remained with normal sinus rhythm since cardioversion on 01/10/2021.  This is a question that is best answered by Cardiology.     2) Should the dose of apixaban be lowered from 5mg  po BID to 2.5mg  po BID in the context of (a) eGFR 20 ml/min, (b) age 45yo, (c) likely occult bleeding given recurrent iron deficiency anemia?  The [place] trial of apixaban in atrial fibrillation used three clinical criteria for dose-reduction of apixaban (needing 2 of 3 criteria met for dose reduction): age >65, sCR >1.5 mg/dL, and weight <29FA.  The patient meets serum creatinine criteria and is close to age criteria.    In scanning the last two clinic notes, I do not see reports of bleeding by the patient.  Calculating HAS-BLED risk score, the patient has 3 points that I am aware of (age >21, bleeding tendency, renal dysfunction) which calculates to a risk of major bleeding of 3.74 per 100-pt-yrs.      If the patient requires ongoing anticoagulation for atrial fibrillation, can consider apixaban trough levels (obtained just prior to the next dose of apixaban) could be obtained to better determine if the patient has elevated levels placing her at further increased risk of bleeding.     3) What is the cause of the elevated PT/INR?   This could be from apixaban, but the PT/INR are a bit higher than expected. That said, the PT/INR has been elevated to a similar degree since 09/2020 (initiated apixaban 07/2020) and there are no reports in the last two clinic notes in the EMR of frank bleeding.  The easiest way to rapidly sort this out would be to stop apixaban and repeat the PT/INR after a few days off of apixaban.  If PT/INR remain increased, a mixing study should be obtained followed by referral to Benign Hematology for consultation.     4) Why is the patient iron deficient?   The most common cause of iron deficiency is GI bleeding. Would consider EGD and colonoscopy to further evaluate if a bleeding source or lack of dietary iron/iron absorption are not otherwise evident.     RECOMMENDATIONS:  1. Discuss with Cardiology whether the patient warrants ongoing apixaban treatment.   2. If apixaban is continued, consider trough level (just before the next dose of apixaban is due).  If the trough level is significantly higher than  the reference range, consider reducing the dose to 2.5mg  po BID.  Please note that Hematology does NOT follow patients in clinic for anticoagulation for atrial fibrillation: they are followed by PCP or Cardiology clinic.   3. Consider holding apixaban for3 days and repeating PT/INR to further evaluate the prolonged PT/INR.  If PT/INR still prolonged, obtain mixing study and in-person consultation to Hematology  4. Consider evaluation for cause of iron deficiency anemia with colonoscopy and EGD.     Thank you for your e-Consult.    I spent 21-25 minutes in medical consultative discussion and review of medical records, including a written report to the treating provider via electronic health record regarding the condition of this patient.    This e-Consult did include an answerable clinical question and did not recommend a clinic visit.    Jules Husbands Romen Yutzy, MD    The recommendations provided in this eConsult are based on the clinical data available to me and are furnished without the benefit of a comprehensive in-person evaluation of the patient. Any new clinical issues or changes in patient status not available to me will need to be taken into account when assessing these recommendations. The ongoing management of this patient is the responsibility of the referring clinician. Please contact me if you have further questions.      History of Present Illness   From Dr. Brooke Dare:  My question: Pt is on apixaban for a fib. Another provider checked an INR and it was 2.46-is this level okay? Does it need to be followed up? She has no history of liver disease)   The other question (which may not be able to be answered in an e-consul)t: Her Hb is low, but not dropping rapidly, gu pos, ferritin low, etc.   Her GFR is 22. She is 79. Her wt is 97 kg. Could I decrease her apixaban to 2.5 mg bid?     Brief chart review:   80 y.o.woman with TTR cardiac amyloid, [name] (eGFR 71ml/min) with associated [name] who was started on apixaban 5mg  po BID for atrial fibrillation in 07/2020.  Clinic not from Ms. Derwood Kaplan of [facility] Cardiology form 06/20/21 notes s/p cardioversion 1/14 and continues to be in NSR since.      Patient with microcytic anemia consistent with iron deficiency 04/2020 (prior to initiation of apixaban) and received IV iron replacement therapy.  Now again with microcytic anemia and low ferritin consistent with iron deficiency.  No EGD nor colonoscopy results found in the EMR.      GGT 215 (0-38) - 09/29/2020  Albumin 3.8.

## 2021-06-25 MED ORDER — SPIRONOLACTONE 25 MG TABLET
Freq: Every day | ORAL | 0 days
Start: 2021-06-25 — End: ?

## 2021-06-25 MED FILL — VYNDAMAX 61 MG CAPSULE: ORAL | 30 days supply | Qty: 30 | Fill #5

## 2021-06-26 DIAGNOSIS — D500 Iron deficiency anemia secondary to blood loss (chronic): Principal | ICD-10-CM

## 2021-06-26 DIAGNOSIS — N179 Acute kidney failure, unspecified: Principal | ICD-10-CM

## 2021-06-26 MED ORDER — NON FORMULARY
0.00000 days
Start: 2021-06-26 — End: ?

## 2021-06-26 MED ORDER — APIXABAN 5 MG TABLET
ORAL_TABLET | Freq: Two times a day (BID) | ORAL | 11 refills | 60 days
Start: 2021-06-26 — End: ?

## 2021-06-26 NOTE — Unmapped (Signed)
I spoke with pt. She is feeling better.   It sounds like she is taking half dose of eliquis.   Will plan for repeat labs next week.

## 2021-06-28 MED ORDER — METFORMIN 500 MG TABLET
Freq: Two times a day (BID) | ORAL | 0.00000 days
Start: 2021-06-28 — End: ?

## 2021-06-29 DIAGNOSIS — I5032 Chronic diastolic (congestive) heart failure: Principal | ICD-10-CM

## 2021-07-01 ENCOUNTER — Ambulatory Visit: Admit: 2021-07-01 | Discharge: 2021-07-02 | Payer: MEDICARE

## 2021-07-01 LAB — BASIC METABOLIC PANEL
ANION GAP: 6 mmol/L (ref 5–14)
BLOOD UREA NITROGEN: 35 mg/dL — ABNORMAL HIGH (ref 9–23)
BUN / CREAT RATIO: 14
CALCIUM: 8.4 mg/dL — ABNORMAL LOW (ref 8.7–10.4)
CHLORIDE: 102 mmol/L (ref 98–107)
CO2: 30.8 mmol/L (ref 20.0–31.0)
CREATININE: 2.54 mg/dL — ABNORMAL HIGH
EGFR CKD-EPI (2021) FEMALE: 19 mL/min/{1.73_m2} — ABNORMAL LOW (ref >=60)
GLUCOSE RANDOM: 149 mg/dL (ref 70–179)
POTASSIUM: 4.7 mmol/L (ref 3.4–4.8)
SODIUM: 139 mmol/L (ref 135–145)

## 2021-07-01 LAB — CBC
HEMATOCRIT: 27.1 % — ABNORMAL LOW (ref 34.0–44.0)
HEMOGLOBIN: 8.3 g/dL — ABNORMAL LOW (ref 11.3–14.9)
MEAN CORPUSCULAR HEMOGLOBIN CONC: 30.7 g/dL — ABNORMAL LOW (ref 32.0–36.0)
MEAN CORPUSCULAR HEMOGLOBIN: 23.7 pg — ABNORMAL LOW (ref 25.9–32.4)
MEAN CORPUSCULAR VOLUME: 77.3 fL — ABNORMAL LOW (ref 77.6–95.7)
MEAN PLATELET VOLUME: 7.9 fL (ref 6.8–10.7)
PLATELET COUNT: 291 10*9/L (ref 150–450)
RED BLOOD CELL COUNT: 3.51 10*12/L — ABNORMAL LOW (ref 3.95–5.13)
RED CELL DISTRIBUTION WIDTH: 22.4 % — ABNORMAL HIGH (ref 12.2–15.2)
WBC ADJUSTED: 6 10*9/L (ref 3.6–11.2)

## 2021-07-01 LAB — PROTIME-INR
INR: 2.15
PROTIME: 25 s — ABNORMAL HIGH (ref 10.3–13.4)

## 2021-07-02 ENCOUNTER — Institutional Professional Consult (permissible substitution): Admit: 2021-07-02 | Payer: MEDICARE

## 2021-07-02 MED ORDER — FOSFOMYCIN TROMETHAMINE 3 GRAM ORAL PACKET
ORAL | 0 refills | 84.00000 days | Status: CP
Start: 2021-07-02 — End: 2021-09-18

## 2021-07-02 NOTE — Unmapped (Signed)
ID Clinic Note - Follow-up Visit    ASSESSMENT:  Barbara Huber is a 80 y.o. 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 Brooke Dare for frequent UTIs and prevents today for follow-up after starting fosfomycin ppx. She is currently doing well without any urinary symptoms. She had a positive urine culture in the ED a few weeks ago, but was asymptomatic, so not likely to be a true UTI. No GI upset from the fosfomycin. Will plan to continue??this for now. She has re-established with Urology as well and is already doing many of the behavioral preventative measures to help prevent UTIs.    PLAN:  - CONTINUE fosfomycin 3g weekly. Refill sent today  - Appreciate [facility] co-management  - Follow-up in person in 3 months.    ID Problem List:  ?? Frequent UTIs  ?? H/o ESBL E.coli from urine   ?? Long term current use of antibiotics     PRIOR ID HISTORY  ?? Gangrenous cholecystitis s/p perc cholecystostomy tube 10/02/20 w/ cx + clostridium perfringens and E coli - seen by ID while inpatient  ??  RELEVANT NON-ID DIAGNOSES:  ?? 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 Atrial fibrillation on Eliquis  ?? T2DM  ?? [name]  ?? OSA      [name], MD, MHS  Assistant Professor, [facility] Division of Infectious Diseases    [facility]   [address], 5th floor  [place], [place] [address]  Phone: [phone]   Fax: [phone]     _____________________________________________________________________      CC: recurrent UTI      HPI: 80 y.o. 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 Brooke Dare for frequent UTIs.??    Please see initial consult note from 05/28/21 for full details of history.    Since last visit, she was hospitalized for COVID-19 pneumonia on 6/7-6/8 and has been to the ED twice since then for LE edema and DOE and on 6/22 for anemia and generalized weakness. During the 6/22 visit, a U/A + culture was sent despite the fact that the pt denied an urinary symptoms at the time. Cx grew Enterocccus faecalis and Streptococcus bovis. Given lack of symptoms, she was appropriately not treated.     In regards to h/o recurrent UTIs, she denies any current or recent dysuria, abdominal pain, frequency, or urgency. No back pain or fevers.    Overall, she reports feeling very well these days. Denies any issues with the fosfomycin, in particular no GI distress.    Past Medical History:   Diagnosis Date   ??? Acute kidney injury superimposed on chronic kidney disease (CMS-HCC) 10/11/2020   ??? Acute on chronic diastolic (congestive) heart failure (CMS-HCC) 08/23/2020   ??? Arthritis    ??? Calculus of kidney    ??? Calculus of ureter    ??? CHF (congestive heart failure) (CMS-HCC)    ??? Chronic atrial fibrillation (CMS-HCC) 07/20/2019   ??? COPD (chronic obstructive pulmonary disease) (CMS-HCC)    ??? Diabetes (CMS-HCC)    ??? Gangrenous cholecystitis 10/11/2020   ??? GERD (gastroesophageal reflux disease)    ??? Hydronephrosis    ??? Hypertension    ??? Hyponatremia 10/11/2020   ??? Intermediate coronary syndrome (CMS-HCC) 03/13/2014   ??? Lower extremity edema  09/28/2020   ??? Lumbar stenosis    ??? Microscopic hematuria    ??? Nausea alone    ??? Nephrolithiasis 04/17/2016   ??? Neuropathy    ??? Nocturia    ??? Other chronic cystitis    ??? Pulmonary hypertension (CMS-HCC)    ??? Renal colic    ??? Sleep apnea    ??? Unstable angina pectoris (CMS-HCC) 03/13/2014       Past Surgical History:   Procedure Laterality Date   ??? BACK SURGERY  1995   ??? CARPAL TUNNEL RELEASE Left 2014   ??? HYSTERECTOMY  1971   ??? IR INSERT CHOLECYSTOSMY TUBE PERCUTANEOUS  10/02/2020    IR INSERT CHOLECYSTOSMY TUBE PERCUTANEOUS 10/02/2020 Braulio Conte, MD IMG VIR [facility]   ??? LUMBAR DISC SURGERY     ??? PR REMOVAL GALLBLADDER N/A 10/06/2020    Procedure: CHOLECYSTECTOMY;  Surgeon: Katherina Mires, MD;  Location: MAIN OR [facility];  Service: Trauma   ??? PR RIGHT HEART CATH O2 SATURATION & CARDIAC OUTPUT N/A 09/30/2020    Procedure: Right Heart Catheterization;  Surgeon: Neal Dy, MD;  Location: [facility] CATH;  Service: Cardiology   ??? PR RIGHT HEART CATH O2 SATURATION & CARDIAC OUTPUT N/A 01/09/2021    Procedure: Right Heart Catheterization;  Surgeon: Lesle Reek, MD;  Location: [facility] CATH;  Service: Cardiology       Allergies   Allergen Reactions   ??? Nitrofurantoin Anaphylaxis and Other (See Comments)   ??? Lipitor [Atorvastatin] Muscle Pain     SOB, Headache, fatigue, sick on my stomach       Current Outpatient Medications   Medication Sig Dispense Refill   ??? ACCU-CHEK AVIVA PLUS TEST STRP Strp USE TO CHECK BLOOD SUGAR 3 TIMES A DAY BEFORE MEALS     ??? acetaminophen (TYLENOL) 500 MG tablet Take 1,000 mg by mouth daily as needed for pain.      ??? albuterol HFA 90 mcg/actuation inhaler Inhale 2 puffs every eight (8) hours as needed for wheezing.     ??? amiodarone (PACERONE) 200 MG tablet Take 1 tablet (200 mg total) by mouth daily. 30 tablet 1   ??? apixaban (ELIQUIS) 5 mg Tab Take 0.5 tablets (2.5 mg total) by mouth Two (2) times a day. 60 tablet 11   ??? estradioL (ESTRACE) 0.01 % (0.1 mg/gram) vaginal cream INSERT ONE GRAM VAGINALLY AT BEDTIME     ??? ferrous sulfate 325 (65 FE) MG tablet Take 1 tablet (325 mg total) by mouth every other day. 30 tablet 3   ??? fluticasone propion-salmeteroL (ADVAIR HFA) 115-21 mcg/actuation inhaler Inhale 2 puffs Two (2) times a day. 12 g 11   ??? fosfomycin (MONUROL) 3 gram Pack Take 3 g by mouth once a week for 12 doses. 36 g 0   ??? gabapentin (NEURONTIN) 300 MG capsule Take 1 capsule (300 mg total) by mouth Three (3) times a day. 90 capsule 0   ??? lancets Misc 1 each by Miscellaneous route daily. Accu Check 100 each 6   ??? metFORMIN (GLUCOPHAGE) 500 MG tablet Take 500 mg by mouth two (2) times a day.     ??? montelukast (SINGULAIR) 10 mg tablet Take 1 tablet (10 mg total) by mouth daily. 90 tablet 3   ??? NARCAN 4 mg/actuation nasal spray 1 spray into alternating nostrils once as needed (opioid overdose).     ??? NON FORMULARY APPLY FROM NECK DOWN TWICE A DAY     ??? oxyCODONE (OXYCONTIN) 10  mg TR12 12 hr crush resistant ER/CR tablet Take 10 mg by mouth every twelve (12) hours.     ??? OXYGEN-AIR DELIVERY SYSTEMS MISC 5 L by Miscellaneous route. 3 L/min via [place]     ??? pantoprazole (PROTONIX) 20 MG tablet Take 1 tablet (20 mg total) by mouth in the morning. 60 tablet 5   ??? rosuvastatin (CRESTOR) 5 MG tablet Take 1 tablet (5 mg total) by mouth every other day. 3015 tablet 11   ??? SITagliptin (JANUVIA) 25 MG tablet Take 1 tablet (25 mg total) by mouth in the morning. 30 tablet 11   ??? spironolactone (ALDACTONE) 25 MG tablet Take 25 mg by mouth in the morning.     ??? tafamidis 61 mg cap Take 1 capsule (61 mg) by mouth daily. 30 capsule 11   ??? tiotropium bromide (SPIRIVA RESPIMAT) 2.5 mcg/actuation inhalation mist Inhale 2 puffs daily. 4 g 11   ??? torsemide (DEMADEX) 20 MG tablet Take 1 tablet (20 mg total) by mouth daily. May also take 1 tablet (20 mg total) daily as needed (weight gain). 60 tablet 11   ??? varicella-zoster [id number], PF, (SHINGRIX, PF,) 50 mcg/0.5 mL SusR injection Inject 0.5 mL into the muscle. 0.5 mL 1     No current facility-administered medications for this visit.       Social history:  H/o 2 vaginal births. Hysterectomy + oopherectomy 50 years ago. No tobacco use.    Social History     Social History Narrative    Previously worked in Designer, fashion/clothing, retired.  Daughter is Economist who works at [employer].  Her niece is Selena Batten, [employer] Anesthesia Tech.  Lives in [place] with daughter and son-in-law.       Family History   Problem Relation Age of Onset   ??? Hypertension Mother    ??? Cancer Father         COLON CANCER   ??? Anesthesia problems Neg Hx    ??? Broken bones Neg Hx    ??? Clotting disorder Neg Hx    ??? Collagen disease Neg Hx    ??? Diabetes Neg Hx    ??? Dislocations Neg Hx    ??? Fibromyalgia Neg Hx    ??? Gout Neg Hx    ??? Hemophilia Neg Hx    ??? Osteoporosis Neg Hx    ??? Rheumatologic disease Neg Hx    ??? Scoliosis Neg Hx    ??? Severe sprains Neg Hx    ??? Sickle cell anemia Neg Hx    ??? Spinal Compression Fracture Neg Hx    ??? GU problems Neg Hx    ??? Kidney cancer Neg Hx    ??? Prostate cancer Neg Hx        ROS: All other systems reviewed and negative except as per HPI.     PE:  There were no vitals taken for this visit. - telephone visit, so no PE done    Labs:    Lab Results   Component Value Date    WBC 6.0 07/01/2021    WBC 6.6 06/23/2021    WBC 12.5 (H) 01/06/2011    Absolute Neutrophils 4.1 06/23/2021    Absolute Lymphocytes 1.3 06/23/2021    Absolute Eosinophils 0.2 06/23/2021    HGB 8.3 (L) 07/01/2021    HGB 12.9 01/06/2011    HCT 27.1 (L) 07/01/2021    HCT 40.8 01/06/2011    Platelet 291 07/01/2021    Platelet 309 06/23/2021    Platelet  260 01/06/2011    Sodium 139 07/01/2021    Sodium 140 01/06/2011    Potassium 4.7 07/01/2021    Potassium 4.1 01/06/2011    BUN 35 (H) 07/01/2021    BUN 20 01/06/2011    Creatinine 2.54 (H) 07/01/2021    Creatinine 2.32 (H) 06/23/2021    Creatinine 0.79 01/06/2011    Glucose 149 07/01/2021    Magnesium 3.6 (H) 06/17/2021    Magnesium 2.0 01/06/2011    T Albumin 3.7 11/29/2020    Albumin 3.8 06/18/2021    Total Bilirubin 0.4 06/18/2021    AST 16 06/18/2021    ALT <7 (L) 06/18/2021    Alkaline Phosphatase 48 06/18/2021    INR 2.15 07/01/2021    INR 1.0 01/06/2011    Sed Rate 21 04/28/2020    CRP <4.0 06/03/2021    CRP 9.1 04/27/2020       Urinalysis:   Lab Results   Component Value Date    WBC, UA 5 09/10/2014    RBC, UA 2 09/10/2014    Squam Epithel, UA 22 (H) 06/18/2021    Squam Epithel, UA 2 09/10/2014    Bacteria, UA Few (A) 06/18/2021    Bacteria, UA FEW 09/10/2014    Leukocyte Esterase, UA Trace (A) 06/18/2021    Leukocyte Esterase, UA TRACE 09/10/2014    Nitrite, UA Negative 06/18/2021    Nitrite, UA POSITIVE 09/10/2014    Protein, UA Negative 06/18/2021    Protein, UA NEGATIVE 09/10/2014    Blood, UA Negative 06/18/2021    Blood, UA NEGATIVE 09/10/2014    pH, UA 7.5 06/18/2021    pH, UA 6.0 09/10/2014       Microbiology:  Urine cultures  05/06/21 (asymptomatic with bland U/A)  50-100K VRE   KIRBY BAUER     Ampicillin Resistant 1     Doxycycline Susceptible     Nitrofurantoin Resistant     Vancomycin Screening Plate Resistant      04/18/21 NG    02/26/21, 02/05/21,   Escherichia coli       MIC SUSCEPTIBILITY RESULT     Ampicillin Susceptible     Cefazolin Susceptible     Cephalexin Susceptible 1     Ciprofloxacin Susceptible     Gentamicin Susceptible     Levofloxacin Susceptible     Nitrofurantoin Susceptible     Piperacillin + Tazobactam Susceptible     Tetracycline Susceptible 2     Tobramycin Susceptible     Trimethoprim + Sulfamethoxazole Susceptible      01/21/21  >100,000 CFU/mL Escherichia coli??Abnormal??    Presumptive Identification   >100,000 CFU/mL Enterococcus faecalis??Abnormal??    Susceptibility Testing By Consultation Only   Processed at Physicians Request   >100,000 CFU/mL Aerococcus urinae??Abnormal??    Aerococcus spp. are predictably susceptible to penicillin, ampicillin, tetracycline, and vancomycin. Resistance to sulfonamides has been reported.       Escherichia coli Enterococcus faecalis       MIC SUSCEPTIBILITY RESULT KIRBY BAUER     Ampicillin Susceptible Susceptible 1     Cefazolin Susceptible      Cephalexin Susceptible 2      Ciprofloxacin Susceptible      Doxycycline  Susceptible     Gentamicin Susceptible      Levofloxacin Susceptible      Nitrofurantoin Susceptible Susceptible     Piperacillin + Tazobactam Susceptible      Tetracycline Susceptible 3      Tobramycin Susceptible  Trimethoprim + Sulfamethoxazole Susceptible      Vancomycin Screening Plate  Susceptible      01/03/21  Escherichia coli       MIC SUSCEPTIBILITY RESULT     Ampicillin Resistant     Aztreonam Resistant     Cefazolin Resistant     Cefepime Resistant     Ceftazidime Intermediate     Ceftriaxone Resistant     Cephalexin Resistant 1     Ciprofloxacin Resistant     Ertapenem Susceptible     Gentamicin Susceptible     Levofloxacin Resistant     Meropenem Susceptible     Nitrofurantoin Susceptible     Tetracycline Resistant 2     Tobramycin Susceptible     Trimethoprim + Sulfamethoxazole Resistant          Imaging:   None         The patient reports they are currently: at home. I spent 10 minutes on the phone with the patient on the date of service. I spent an additional 15 minutes on pre- and post-visit activities on the date of service.     The patient was physically located in [place] or a state in which I am permitted to provide care. The patient and/or parent/guardian understood that s/he may incur co-pays and cost sharing, and agreed to the telemedicine visit. The visit was reasonable and appropriate under the circumstances given the patient's presentation at the time.    The patient and/or parent/guardian has been advised of the potential risks and limitations of this mode of treatment (including, but not limited to, the absence of in-person examination) and has agreed to be treated using telemedicine. The patient's/patient's family's questions regarding telemedicine have been answered.     If the visit was completed in an ambulatory setting, the patient and/or parent/guardian has also been advised to contact their provider???s office for worsening conditions, and seek emergency medical treatment and/or call 911 if the patient deems either necessary.

## 2021-07-03 ENCOUNTER — Ambulatory Visit: Admit: 2021-07-03 | Discharge: 2021-07-04 | Payer: MEDICARE

## 2021-07-03 DIAGNOSIS — N179 Acute kidney failure, unspecified: Principal | ICD-10-CM

## 2021-07-03 DIAGNOSIS — I10 Essential (primary) hypertension: Principal | ICD-10-CM

## 2021-07-03 DIAGNOSIS — R601 Generalized edema: Principal | ICD-10-CM

## 2021-07-03 DIAGNOSIS — D500 Iron deficiency anemia secondary to blood loss (chronic): Principal | ICD-10-CM

## 2021-07-03 LAB — URINALYSIS
BILIRUBIN UA: NEGATIVE
BLOOD UA: NEGATIVE
GLUCOSE UA: NEGATIVE
HYALINE CASTS: 3 /LPF — ABNORMAL HIGH (ref 0–1)
KETONES UA: NEGATIVE
LEUKOCYTE ESTERASE UA: NEGATIVE
NITRITE UA: NEGATIVE
PH UA: 7.5 (ref 5.0–9.0)
PROTEIN UA: NEGATIVE
RBC UA: <1 /HPF (ref <4)
SPECIFIC GRAVITY UA: 1.01 (ref 1.005–1.030)
SQUAMOUS EPITHELIAL: 7 /HPF — ABNORMAL HIGH (ref 0–5)
UROBILINOGEN UA: 0.2
WBC UA: 1 /HPF (ref 0–5)

## 2021-07-03 LAB — CBC W/ AUTO DIFF
BASOPHILS ABSOLUTE COUNT: 0.1 10*9/L (ref 0.0–0.1)
BASOPHILS RELATIVE PERCENT: 1.3 %
EOSINOPHILS ABSOLUTE COUNT: 0.2 10*9/L (ref 0.0–0.5)
EOSINOPHILS RELATIVE PERCENT: 3 %
HEMATOCRIT: 29.7 % — ABNORMAL LOW (ref 34.0–44.0)
HEMOGLOBIN: 8.8 g/dL — ABNORMAL LOW (ref 11.3–14.9)
LYMPHOCYTES ABSOLUTE COUNT: 1.3 10*9/L (ref 1.1–3.6)
LYMPHOCYTES RELATIVE PERCENT: 16.9 %
MEAN CORPUSCULAR HEMOGLOBIN CONC: 29.5 g/dL — ABNORMAL LOW (ref 32.0–36.0)
MEAN CORPUSCULAR HEMOGLOBIN: 23.2 pg — ABNORMAL LOW (ref 25.9–32.4)
MEAN CORPUSCULAR VOLUME: 78.5 fL (ref 77.6–95.7)
MEAN PLATELET VOLUME: 8.1 fL (ref 6.8–10.7)
MONOCYTES ABSOLUTE COUNT: 1 10*9/L — ABNORMAL HIGH (ref 0.3–0.8)
MONOCYTES RELATIVE PERCENT: 12.8 %
NEUTROPHILS ABSOLUTE COUNT: 5 10*9/L (ref 1.8–7.8)
NEUTROPHILS RELATIVE PERCENT: 66 %
PLATELET COUNT: 269 10*9/L (ref 150–450)
RED BLOOD CELL COUNT: 3.78 10*12/L — ABNORMAL LOW (ref 3.95–5.13)
RED CELL DISTRIBUTION WIDTH: 23.9 % — ABNORMAL HIGH (ref 12.2–15.2)
WBC ADJUSTED: 7.6 10*9/L (ref 3.6–11.2)

## 2021-07-03 LAB — COMPREHENSIVE METABOLIC PANEL
ALBUMIN: 4 g/dL (ref 3.4–5.0)
ALKALINE PHOSPHATASE: 61 U/L (ref 46–116)
ALT (SGPT): 8 U/L — ABNORMAL LOW (ref 10–49)
ANION GAP: 6 mmol/L (ref 5–14)
AST (SGOT): 10 U/L (ref <=34)
BILIRUBIN TOTAL: 0.4 mg/dL (ref 0.3–1.2)
BLOOD UREA NITROGEN: 35 mg/dL — ABNORMAL HIGH (ref 9–23)
BUN / CREAT RATIO: 14
CALCIUM: 9.1 mg/dL (ref 8.7–10.4)
CHLORIDE: 98 mmol/L (ref 98–107)
CO2: 32.9 mmol/L — ABNORMAL HIGH (ref 20.0–31.0)
CREATININE: 2.5 mg/dL — ABNORMAL HIGH
EGFR CKD-EPI (2021) FEMALE: 19 mL/min/{1.73_m2} — ABNORMAL LOW (ref >=60)
GLUCOSE RANDOM: 119 mg/dL (ref 70–179)
POTASSIUM: 4.8 mmol/L (ref 3.4–4.8)
PROTEIN TOTAL: 7.4 g/dL (ref 5.7–8.2)
SODIUM: 137 mmol/L (ref 135–145)

## 2021-07-03 LAB — SLIDE REVIEW

## 2021-07-03 NOTE — Unmapped (Signed)
Internal Medicine Clinic Visit    Reason for visit: Follow up for leg edema     A/P:  Barbara Huber was seen today for other.    Diagnoses and all orders for this visit:    Generalized edema/Heart failure  Assessment: Patient has a history of cardiac amyloidosis with heart failure, AKI and COPD who presented to clinic today with leg swelling. She is followed by [facility] cardiology. She is current taking torsemide 40 mg daily, spironolactone 50mg  daily as opposed to her regular regimen of torsemide variable dosing (20mg  and 40mg ) and Spironolactone 25 mg,  due to the increased leg swelling. She has not experience SOB. Lungs were clear to ausculation on exam. The most likely cause of her fluid retention is diastolic dysfunction and AKI. She may also not be absorbing her torsemide because of gut edema  Plan:  Diagnostics: The following test and labs were collected today   -     ECG 12 Lead- Normal sinus rhythm, low voltage QRS    -     Comprehensive Metabolic Panel  -     Pro-BNP  -     CBC w/ Differential  -     Urinalysis  Therapeutics: No medication changes.    Patient Ed: Follow up with diuresis clinic tomorrow at 11 am. Do not drink too many fluids.     AKI (acute kidney injury) (CMS-HCC)   Assessment: Had a bump in her creatinine on 05/28/21 (see below). Torsemide was changed to every other day and spironolactone was decreased from 50mg  to 25mg . After this switch she began to experience lower leg edema and began taking torsemide 40 mg daily and spironolactone 50 mg daily. Her decreased kidney function is likely due to prerenal disease in the setting of decreased cardiac output, poor renal perfusion and diuretic use. Due to her heart failure, she requires diuretics to control volume overload and her edematous state; however, the diuretics are likely contributing her kidney injury.    Note: I did mention the gravity of the situation to the pt and daughter and that if kidney function worsens, pt would likely not be a good candidate for dialysis.   Review of Data:   CT abd pelvis 01/23/2021: Subcentimeter calcifications within the right renal pelvis are favored to be vascular in origin. No obstructing calculus. There is urothelial thickening throughout the right renal pelvis (3:50) with mild asymmetric dilation. No obvious hydronephrosis. No obstructing calculus is present.  Stable left renal cyst.  Urinalysis 05/2021: no protein   Lab Results   Component Value Date    CREATININE 2.50 (H) 07/03/2021    CREATININE 2.54 (H) 07/01/2021    CREATININE 2.32 (H) 06/23/2021    CREATININE 2.47 (H) 06/18/2021    CREATININE 2.21 (H) 06/17/2021    CREATININE 1.90 (H) 06/12/2021    CREATININE 2.21 (H) 06/09/2021    CREATININE 2.07 (H) 06/04/2021    CREATININE 2.34 (H) 06/03/2021    CREATININE 2.57 (H) 06/02/2021    CREATININE 2.59 (H) 06/02/2021    CREATININE 2.65 (H) 05/28/2021       Plan:  Diagnostics:  - Urinalysis- No protein, hyaline casts   - Comprehensive Metabolic Panel-  Therapeutics: No medication changes today; will go to diuresis clinic today. Care coordinated with cardiology team.   Noted: pt has nephrologist, whom she saw recently  Addenducm: Creat stable at 2.5 ; Hb stable at 8.8  Essential hypertension  Assessment: History of hypertension. Blood pressure is controlled on Spironolactone and torsemide.  BP 118/57. Goal BP <130/80  Plan:  Diagnostics: None today   Therapeutics: Continue treatment     Iron deficiency anemia due to chronic blood loss  Assessment: Patient has a history of IDA. Received transfusion of1  pRBC on 06/18/2021. She feels that her energy is improved. Takes daily oral iron supplement. Apixaban has been decreased to 2.5 mg bid, given age and renal failure and anemia   Plan:  Diagnostics: CBC w/ Differential   Therapeutics: Has upcoming IV iron infusion.   Patient Ed: Recommend taking iron supplement every other day to improve absorption.    Health Maintenance  Health Maintenance Due   Topic Date Due   ??? Retinal Eye Exam  Never done   ??? DTaP/Tdap/Td Vaccines (1 - Tdap) Never done   ??? DEXA Scan-Start Age 77  Never done   ??? COVID-19 Vaccine (4 - Booster for Pfizer series) 04/12/2021   ??? Zoster Vaccines (2 of 2) 05/30/2021      No follow-ups on file.    HPI:  Pt is a 80 y.o. female with a history of cardiac amyloidosis with heart failure, [name], Afib on Eliquis, COPD on 3L home O2, GERD, HLD, T2DM, and OSA.    Feels great expect for legs swelling. Breathing has been ok expect when moving from car to house. No SOB when she lies down at night. She in urinating regularly, not increased frequency.     Taking Torsemide 40 mg everyday since the swelling has been increasing.   Fosfomycin every week  Gabapentin 300mg  TID  Spironolactone has been taking whole pill (50 mg) since swelling started    Takes oral iron supplement everyday     No recent falls    Problem List:  Patient Active Problem List   Diagnosis   ??? Spinal stenosis of lumbar region   ??? Thoracic or lumbosacral neuritis or radiculitis   ??? Lumbosacral spondylosis   ??? COPD (chronic obstructive pulmonary disease) (CMS-HCC)   ??? Sleep apnea in adult   ??? Essential hypertension   ??? AKI (acute kidney injury) (CMS-HCC)   ??? Type 2 diabetes mellitus with stage 3b chronic kidney disease, without long-term current use of insulin (CMS-HCC)   ??? Chronic cystitis   ??? Incomplete bladder emptying   ??? Nocturia   ??? Peripheral neuropathy   ??? Chronic respiratory failure with hypoxia (CMS-HCC)   ??? Anemia   ??? Persistent atrial fibrillation (CMS-HCC)   ??? Anemia in chronic kidney disease   ??? Chronic kidney disease, stage III (moderate) (CMS-HCC)   ??? Enrolled in chronic care management   ??? Pulmonary hypertension (CMS-HCC)   ??? Shortness of breath   ??? Chronic prescription opiate use   ??? Dyspnea on exertion   ??? 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 Cardiac amyloidosis (CMS-HCC)   ??? Recurrent cold sores   ??? Persistent fatigue after COVID-19   ??? Dyspepsia   ??? Iron deficiency anemia due to chronic blood loss   ??? Generalized edema        Medications:  Reviewed in EPIC    Social History  Lives with daughter Barbara Huber    Physical Exam:   Vital Signs:  Vitals:    07/03/21 1444   BP: 118/57   BP Site: R Arm   BP Position: Sitting   Pulse: 64   Resp: 16   Temp: 37.1 ??C (98.8 ??F)   TempSrc: Oral   SpO2: 96%   Weight: (!) 101.6 kg (  224 lb)   Height: 162.6 cm (5' 4.02)        Wt Readings from Last 12 Encounters:   07/03/21 (!) 101.6 kg (224 lb)   06/18/21 97.5 kg (215 lb)   06/17/21 97.5 kg (215 lb)   06/12/21 98 kg (216 lb)   06/03/21 96.2 kg (212 lb)   06/02/21 96.2 kg (212 lb)   05/23/21 95.7 kg (211 lb)   05/06/21 97.8 kg (215 lb 9.6 oz)   04/25/21 96 kg (211 lb 11.2 oz)   04/21/21 (!) 101.4 kg (223 lb 8 oz)   04/04/21 97.6 kg (215 lb 3.2 oz)   03/21/21 96.2 kg (212 lb)       Gen: well appearing, alert and oriented, NAD   CV: Regular, with occasional irr beats; elevated JVP  Pulm: Clear to ausculation bilaterally, no crackles or wheezes  Ext: All extremities warm and well perfused. Bilateral lower leg 2-3+ pitting edema  Psych: mood is great and better than it has been in awhile       Lab Results   Component Value Date    CREATININE 2.50 (H) 07/03/2021    CHOL 132 08/23/2020    HDL 32 (L) 08/23/2020    LDL 78 08/23/2020    NONHDL 100 08/23/2020    TRIG 109 08/23/2020    A1C <3.8 (L) 06/02/2021         The 10-year ASCVD risk score Denman George DC Jr., et al., 2013) is: 19.1%   Medication adherence and barriers to the treatment plan have been addressed. Opportunities to optimize healthy behaviors have been discussed. Patient / caregiver voiced understanding.           Evette Doffing Raliegh Scarlet, MS3  _________________________    This note was written by [facility] medical student Virgia Land and edited by Jacquiline Doe, MD.   I attest that I have reviewed the medical student note and that the components of the history of the present illness, the physical exam, and the assessment and plan documented were performed by me or were performed in my presence by the student where I verified the documentation and performed (or re-performed) the exam and medical decision making. Jacquiline Doe, MD    I personally spent 45 minutes face-to-face and non-face-to-face in the care of this patient, which includes all pre, intra, and post visit time on the date of service.

## 2021-07-03 NOTE — Unmapped (Addendum)
We are going to try and get you into diuresis clinic tomorrow.   Don't drink too many fluids.   You can come to diuresis clinic tomorrow at 11 am

## 2021-07-03 NOTE — Unmapped (Signed)
It was talk to you today. Glad to hear you are doing well. Please continue the fosfomycin once weekly. I have sent in a refill in case you need it.

## 2021-07-04 ENCOUNTER — Ambulatory Visit: Admit: 2021-07-04 | Discharge: 2021-07-04 | Payer: MEDICARE | Attending: Adult Health | Primary: Adult Health

## 2021-07-04 DIAGNOSIS — I5032 Chronic diastolic (congestive) heart failure: Principal | ICD-10-CM

## 2021-07-04 LAB — PRO-BNP: PRO-BNP: 3420 pg/mL — ABNORMAL HIGH (ref 0.0–450.0)

## 2021-07-04 MED ADMIN — furosemide (LASIX) injection 80 mg: 80 mg | INTRAVENOUS | @ 15:00:00 | Stop: 2021-07-04

## 2021-07-04 MED ADMIN — furosemide (LASIX) injection 80 mg: 80 mg | INTRAVENOUS | @ 17:00:00 | Stop: 2021-07-04

## 2021-07-04 NOTE — Unmapped (Addendum)
[name] Clinic Discharge Instructions:    MEDICATIONS:  We are changing your medications today.  - Increase torsemide to 40mg  twice daily. Reduce to 40mg  in the morning and 20mg  in the afternoon if weight reaches 215lbs.   Call if you have questions about your medications.    IMPORTANT INSTRUCTIONS:  - Weigh yourself daily in the morning; write your weight down and bring it with you next time   - Limit your fluid intake to 2 Liters (half-gallon) per day    - Limit your salt intake to 2-3 grams (2000-3000 mg) per day  - If you have a morning diuresis appointment, don't take your morning diuretic  - If you have an afternoon diuresis appointment, take your morning diuretic    LABS:  We did not check labs today    NEXT APPOINTMENT:  Return to clinic in 4 days with [name] - Tuesday at 1pm      My office number (c/o De Burrs RN) is [phone] if you need further assistance, or need to schedule with our [name] clinic in the future.    If you need to reschedule future appointments, please call [phone].   After office hours, if you have urgent questions/problems, contact the on-call cardiologist through the hospital operator: [phone].

## 2021-07-04 NOTE — Unmapped (Signed)
[name] Note    Primary Cardiologist/Cardiology Provider(s):  Dr Wonda Cheng  PCP:  Jacquiline Doe, MD    Last Cardiology Clinic Visit:  06/20/2021  Last [name] Visit: None    Reason for Visit:  Barbara Huber is a 80 y.o. female with a history of diastolic heart failure, who is being seen today in the [facility] [name] [facility] for an urgent visit for volume status optimization including [name].    Assessment/Plan:  1. Acute on Chronic diastolic heart failure  - Volume status today is increased. Weight is up about 10lbs from baseline of 214-215lbs.   - NYHA Class II  - IV Lasix 80 mg x2 was administered during clinic with fair response  - Labs were reviewed from 7/7: Cr 2.5, K 4.8  - Increase torsemide to 40mg  BID until weight is 215lbs or less, then reduce to 40mg  qAM, 20mg  qPM  - Continue spironolactone 25mg  daily    Response to [name]:         Medications Heart rate Blood pressure Weight (lbs)   Pre  66 116/54 224   Hour 0 IV Lasix 80 mg      Hour 2 IV Lasix 80 mg      Post         Output:  I/O  Timeline      07/06 0701  07/07 0700 07/07 0701  07/08 0700 07/08 0701  07/09 0700    Urine (mL/kg/hr)   450    Total Output(mL/kg)   450 (4.4)    Net   -450           Urine Occurrence   2 x          Return to clinic:    Return in about 4 days (around 07/08/2021) for Diuresis Clinic.    Future Appointments   Date Time Provider Department Center   07/08/2021  1:00 PM DIURESIS HF NP [place] [facility]   07/10/2021  4:10 PM Artelia Laroche, MD [facility]   07/15/2021  2:45 PM UNCTIF 24 [facility]   07/22/2021  2:45 PM UNCTIF 24 [facility]   07/29/2021  2:45 PM UNCTIF 21 [facility]   08/05/2021  2:45 PM UNCTIF 15 [facility]   08/12/2021  2:45 PM UNCTIF RN 6 [facility]   08/15/2021 12:30 PM HBR CT RM 2 [facility] [place] [facility]   08/15/2021  2:45 PM Vickii Penna, MD [facility]   08/29/2021 10:00 AM Carin Hock, MD [facility]   09/03/2021  2:30 PM Melanee Left, MD [facility]       History of Present Illness:  Barbara Huber is a 80 y.o. female with a history of diastolic heart failure who presents today for [name]. Patient last seen by her PCP yesterday 7/7.  At that visit, she denied worsening SOB and was feeling great, but having more LE edema than usual. She was taking torsemide 40mg  daily. Per notes, she was discharged from hospital on 6/8 on torsemide 20mg  daily. However, pt states she continued on 40mg  daily. On 6/21, PCP recommended she reduce it to every other day, but she never did that because she started having more LE edema. She has continued on torsemide 40mg  daily and spironolactone 25mg  daily. Her weight has gradually increased from dry weight of 214lbs-215lbs up to 223-224lbs. She hasn't had rapid weight gain, just a  slow gain over the last several weeks corresponding to worsening LE edema. Legs were not swollen like this when weight was down at goal. They are better in the morning, but not normal.  She denies SOB and actually feels much better than she did a few months or weeks ago. She denies orthopnea/PND. She might be a little bloated at at times.     Past Medical History:   Diagnosis Date   ??? Acute kidney injury superimposed on chronic kidney disease (CMS-HCC) 10/11/2020   ??? Acute on chronic diastolic (congestive) heart failure (CMS-HCC) 08/23/2020   ??? Arthritis    ??? Calculus of kidney    ??? Calculus of ureter    ??? CHF (congestive heart failure) (CMS-HCC)    ??? Chronic atrial fibrillation (CMS-HCC) 07/20/2019   ??? COPD (chronic obstructive pulmonary disease) (CMS-HCC)    ??? Diabetes (CMS-HCC)    ??? Gangrenous cholecystitis 10/11/2020   ??? GERD (gastroesophageal reflux disease)    ??? Hydronephrosis    ??? Hypertension    ??? Hyponatremia 10/11/2020   ??? Intermediate coronary syndrome (CMS-HCC) 03/13/2014   ??? Lower extremity edema 09/28/2020   ??? Lumbar stenosis    ??? Microscopic hematuria    ??? Nausea alone    ??? Nephrolithiasis 04/17/2016   ??? Neuropathy    ??? Nocturia    ??? Other chronic cystitis    ??? Pulmonary hypertension (CMS-HCC)    ??? Renal colic    ??? Sleep apnea    ??? Unstable angina pectoris (CMS-HCC) 03/13/2014     Past Surgical History:   Procedure Laterality Date   ??? BACK SURGERY  1995   ??? CARPAL TUNNEL RELEASE Left 2014   ??? HYSTERECTOMY  1971   ??? IR INSERT CHOLECYSTOSMY TUBE PERCUTANEOUS  10/02/2020    IR INSERT CHOLECYSTOSMY TUBE PERCUTANEOUS 10/02/2020 Braulio Conte, MD IMG VIR [facility]   ??? LUMBAR DISC SURGERY     ??? PR REMOVAL GALLBLADDER N/A 10/06/2020    Procedure: CHOLECYSTECTOMY;  Surgeon: Katherina Mires, MD;  Location: MAIN OR [facility];  Service: Trauma   ??? PR RIGHT HEART CATH O2 SATURATION & CARDIAC OUTPUT N/A 09/30/2020    Procedure: Right Heart Catheterization;  Surgeon: Neal Dy, MD;  Location: [facility] CATH;  Service: Cardiology   ??? PR RIGHT HEART CATH O2 SATURATION & CARDIAC OUTPUT N/A 01/09/2021    Procedure: Right Heart Catheterization;  Surgeon: Lesle Reek, MD;  Location: [facility] CATH;  Service: Cardiology       Current Outpatient Medications   Medication Sig Dispense Refill   ??? ACCU-CHEK AVIVA PLUS TEST STRP Strp USE TO CHECK BLOOD SUGAR 3 TIMES A DAY BEFORE MEALS     ??? acetaminophen (TYLENOL) 500 MG tablet Take 1,000 mg by mouth daily as needed for pain.      ??? albuterol HFA 90 mcg/actuation inhaler Inhale 2 puffs every eight (8) hours as needed for wheezing.     ??? amiodarone (PACERONE) 200 MG tablet Take 1 tablet (200 mg total) by mouth daily. 30 tablet 1   ??? apixaban (ELIQUIS) 5 mg Tab Take 0.5 tablets (2.5 mg total) by mouth Two (2) times a day. 60 tablet 11   ??? estradioL (ESTRACE) 0.01 % (0.1 mg/gram) vaginal cream INSERT ONE GRAM VAGINALLY AT BEDTIME     ??? ferrous sulfate 325 (65 FE) MG tablet Take 1 tablet (325 mg total) by mouth every other day. 30 tablet 3   ??? fluticasone propion-salmeteroL (ADVAIR HFA) 115-21 mcg/actuation  inhaler Inhale 2 puffs Two (2) times a day. 12 g 11   ??? fosfomycin (MONUROL) 3 gram Pack Take 3 g by mouth once a week for 12 doses. 36 g 0   ??? gabapentin (NEURONTIN) 300 MG capsule Take 1 capsule (300 mg total) by mouth Three (3) times a day. 90 capsule 0   ??? lancets Misc 1 each by Miscellaneous route daily. Accu Check 100 each 6   ??? montelukast (SINGULAIR) 10 mg tablet Take 1 tablet (10 mg total) by mouth daily. 90 tablet 3   ??? NARCAN 4 mg/actuation nasal spray 1 spray into alternating nostrils once as needed (opioid overdose).     ??? NON FORMULARY APPLY FROM NECK DOWN TWICE A DAY     ??? oxyCODONE (OXYCONTIN) 10 mg TR12 12 hr crush resistant ER/CR tablet Take 10 mg by mouth every twelve (12) hours.     ??? OXYGEN-AIR DELIVERY SYSTEMS MISC 5 L by Miscellaneous route. 3 L/min via [place]     ??? pantoprazole (PROTONIX) 20 MG tablet Take 1 tablet (20 mg total) by mouth in the morning. 60 tablet 5   ??? rosuvastatin (CRESTOR) 5 MG tablet Take 1 tablet (5 mg total) by mouth every other day. 3015 tablet 11   ??? SITagliptin (JANUVIA) 25 MG tablet Take 1 tablet (25 mg total) by mouth in the morning. 30 tablet 11   ??? spironolactone (ALDACTONE) 25 MG tablet Take 25 mg by mouth in the morning.     ??? tafamidis 61 mg cap Take 1 capsule (61 mg) by mouth daily. 30 capsule 11   ??? tiotropium bromide (SPIRIVA RESPIMAT) 2.5 mcg/actuation inhalation mist Inhale 2 puffs daily. 4 g 11   ??? torsemide (DEMADEX) 20 MG tablet Take 1 tablet (20 mg total) by mouth daily. May also take 1 tablet (20 mg total) daily as needed (weight gain). 60 tablet 11   ??? varicella-zoster [id number], PF, (SHINGRIX, PF,) 50 mcg/0.5 mL SusR injection Inject 0.5 mL into the muscle. 0.5 mL 1     No current facility-administered medications for this visit.     Allergies   Allergen Reactions   ??? Nitrofurantoin Anaphylaxis and Other (See Comments)   ??? Lipitor [Atorvastatin] Muscle Pain     SOB, Headache, fatigue, sick on my stomach       Objective:       Physical Exam  BP 116/54 (BP Site: R Arm, BP Position: Sitting, BP Cuff Size: Medium)  - Pulse 66  - Wt (!) 101.6 kg (224 lb)  - SpO2 94% Comment: 3L O2 [place] - BMI 38.43 kg/m??    Wt Readings from Last 12 Encounters:   07/04/21 (!) 101.6 kg (224 lb)   07/03/21 (!) 101.6 kg (224 lb)   06/18/21 97.5 kg (215 lb)   06/17/21 97.5 kg (215 lb)   06/12/21 98 kg (216 lb)   06/03/21 96.2 kg (212 lb)   06/02/21 96.2 kg (212 lb)   05/23/21 95.7 kg (211 lb)   05/06/21 97.8 kg (215 lb 9.6 oz)   04/25/21 96 kg (211 lb 11.2 oz)   04/21/21 (!) 101.4 kg (223 lb 8 oz)   04/04/21 97.6 kg (215 lb 3.2 oz)       General:  Patient is well-appearing in no acute distress   Eyes:  Intact, sclerae anicteric.   Ears, nose, mouth: Benign   Respiratory:   Normal respiratory effort. Clear to auscultation bilaterally.  There are no wheezes.   Cardiovascular:  JVP  3/4 up neck with HOB at 90degrees.  Rate and rhythm are regular.  There is no lifts or heaves.  Normal S1, S2. There is no murmur, gallops or rubs.  Radial and pedal pulses are 2+, bilaterally.   There is 2+ pitting pedal/pretibial edema, bilaterally.    Gastrointestinal:   Soft, non-tender, with audible bowel sounds. Abdomen nondistended.  Liver is nonpalpable.   Musculoskeletal: No joint swelling   Skin: Warm, well perfused.   Neurologic: Appropriate mood and affect. Alert and oriented to person, place, and time. No gross motor or sensory deficits evident.     Recent Labs:  Office Visit on 07/03/2021   Component Date Value Ref Range Status   ??? EKG Ventricular Rate 07/03/2021 64  BPM Preliminary   ??? EKG Atrial Rate 07/03/2021 64  BPM Preliminary   ??? EKG P-R Interval 07/03/2021 206  ms Preliminary   ??? EKG QRS Duration 07/03/2021 90  ms Preliminary   ??? EKG Q-T Interval 07/03/2021 482  ms Preliminary   ??? EKG QTC Calculation 07/03/2021 497  ms Preliminary   ??? EKG Calculated P Axis 07/03/2021 62  degrees Preliminary   ??? EKG Calculated R Axis 07/03/2021 85  degrees Preliminary   ??? EKG Calculated T Axis 07/03/2021 46  degrees Preliminary   ??? QTC Fredericia 07/03/2021 492  ms Preliminary   ??? Sodium 07/03/2021 137  135 - 145 mmol/L Final   ??? Potassium 07/03/2021 4.8  3.4 - 4.8 mmol/L Final   ??? Chloride 07/03/2021 98  98 - 107 mmol/L Final   ??? CO2 07/03/2021 32.9 (A) 20.0 - 31.0 mmol/L Final   ??? Anion Gap 07/03/2021 6  5 - 14 mmol/L Final   ??? BUN 07/03/2021 35 (A) 9 - 23 mg/dL Final   ??? Creatinine 07/03/2021 2.50 (A) 0.60 - 0.80 mg/dL Final   ??? BUN/Creatinine Ratio 07/03/2021 14   Final   ??? eGFR [name]-EPI (2021) Female 07/03/2021 19 (A) >=60 mL/min/1.19m2 Final   ??? Glucose 07/03/2021 119  70 - 179 mg/dL Final   ??? Calcium 09/60/4540 9.1  8.7 - 10.4 mg/dL Final   ??? Albumin 98/10/9146 4.0  3.4 - 5.0 g/dL Final   ??? Total Protein 07/03/2021 7.4  5.7 - 8.2 g/dL Final   ??? Total Bilirubin 07/03/2021 0.4  0.3 - 1.2 mg/dL Final   ??? AST 82/95/6213 10  <=34 U/L Final   ??? ALT 07/03/2021 8 (A) 10 - 49 U/L Final   ??? Alkaline Phosphatase 07/03/2021 61  46 - 116 U/L Final   ??? Color, UA 07/03/2021 Yellow   Final   ??? Clarity, UA 07/03/2021 Clear   Final   ??? Specific Gravity, UA 07/03/2021 1.010  1.005 - 1.030 Final   ??? pH, UA 07/03/2021 7.5  5.0 - 9.0 Final   ??? Leukocyte Esterase, UA 07/03/2021 Negative  Negative Final   ??? Nitrite, UA 07/03/2021 Negative  Negative Final   ??? Protein, UA 07/03/2021 Negative  Negative Final   ??? Glucose, UA 07/03/2021 Negative  Negative Final   ??? Ketones, UA 07/03/2021 Negative  Negative Final   ??? Urobilinogen, UA 07/03/2021 0.2 mg/dL  0.2 - 2.0 mg/dL Final   ??? Bilirubin, UA 07/03/2021 Negative  Negative Final   ??? Blood, UA 07/03/2021 Negative  Negative Final   ??? RBC, UA 07/03/2021 <1  <4 /HPF Final   ??? WBC, UA 07/03/2021 1  0 - 5 /HPF Final   ??? Squam Epithel, UA 07/03/2021 7 (A) 0 -  5 /HPF Final   ??? Bacteria, UA 07/03/2021 Many (A) None Seen /HPF Final   ??? Hyaline Casts, UA 07/03/2021 3 (A) 0 - 1 /LPF Final   ??? WBC 07/03/2021 7.6  3.6 - 11.2 10*9/L Final   ??? RBC 07/03/2021 3.78 (A) 3.95 - 5.13 10*12/L Final ??? HGB 07/03/2021 8.8 (A) 11.3 - 14.9 g/dL Final   ??? HCT 21/30/8657 29.7 (A) 34.0 - 44.0 % Final   ??? MCV 07/03/2021 78.5  77.6 - 95.7 fL Final   ??? MCH 07/03/2021 23.2 (A) 25.9 - 32.4 pg Final   ??? MCHC 07/03/2021 29.5 (A) 32.0 - 36.0 g/dL Final   ??? RDW 84/69/6295 23.9 (A) 12.2 - 15.2 % Final   ??? MPV 07/03/2021 8.1  6.8 - 10.7 fL Final   ??? Platelet 07/03/2021 269  150 - 450 10*9/L Final   ??? Neutrophils % 07/03/2021 66.0  % Final   ??? Lymphocytes % 07/03/2021 16.9  % Final   ??? Monocytes % 07/03/2021 12.8  % Final   ??? Eosinophils % 07/03/2021 3.0  % Final   ??? Basophils % 07/03/2021 1.3  % Final   ??? Absolute Neutrophils 07/03/2021 5.0  1.8 - 7.8 10*9/L Final   ??? Absolute Lymphocytes 07/03/2021 1.3  1.1 - 3.6 10*9/L Final   ??? Absolute Monocytes 07/03/2021 1.0 (A) 0.3 - 0.8 10*9/L Final   ??? Absolute Eosinophils 07/03/2021 0.2  0.0 - 0.5 10*9/L Final   ??? Absolute Basophils 07/03/2021 0.1  0.0 - 0.1 10*9/L Final   ??? Anisocytosis 07/03/2021 Marked (A) Not Present Final   ??? Smear Review Comments 07/03/2021 See Comment (A) Undefined Final   ??? Polychromasia 07/03/2021 Slight (A) Not Present Final   ??? Acanthocytes 07/03/2021 Present (A) Not Present Final   Appointment on 07/01/2021   Component Date Value Ref Range Status   ??? PT 07/01/2021 25.0 (A) 10.3 - 13.4 sec Final   ??? INR 07/01/2021 2.15   Final   ??? Sodium 07/01/2021 139  135 - 145 mmol/L Final   ??? Potassium 07/01/2021 4.7  3.4 - 4.8 mmol/L Final   ??? Chloride 07/01/2021 102  98 - 107 mmol/L Final   ??? CO2 07/01/2021 30.8  20.0 - 31.0 mmol/L Final   ??? Anion Gap 07/01/2021 6  5 - 14 mmol/L Final   ??? BUN 07/01/2021 35 (A) 9 - 23 mg/dL Final   ??? Creatinine 07/01/2021 2.54 (A) 0.60 - 0.80 mg/dL Final   ??? BUN/Creatinine Ratio 07/01/2021 14   Final   ??? eGFR [name]-EPI (2021) Female 07/01/2021 19 (A) >=60 mL/min/1.16m2 Final   ??? Glucose 07/01/2021 149  70 - 179 mg/dL Final   ??? Calcium 28/41/3244 8.4 (A) 8.7 - 10.4 mg/dL Final   ??? WBC 12/30/7251 6.0  3.6 - 11.2 10*9/L Final   ??? RBC 07/01/2021 3.51 (A) 3.95 - 5.13 10*12/L Final   ??? HGB 07/01/2021 8.3 (A) 11.3 - 14.9 g/dL Final   ??? HCT 66/44/0347 27.1 (A) 34.0 - 44.0 % Final   ??? MCV 07/01/2021 77.3 (A) 77.6 - 95.7 fL Final   ??? MCH 07/01/2021 23.7 (A) 25.9 - 32.4 pg Final   ??? MCHC 07/01/2021 30.7 (A) 32.0 - 36.0 g/dL Final   ??? RDW 42/59/5638 22.4 (A) 12.2 - 15.2 % Final   ??? MPV 07/01/2021 7.9  6.8 - 10.7 fL Final   ??? Platelet 07/01/2021 291  150 - 450 10*9/L Final       Lab Results  Component Value Date    PRO-BNP 905.0 (H) 03/21/2021    PRO-BNP 3,796.0 (H) 12/23/2020    PRO-BNP 1,970.0 (H) 05/30/2020    PRO-BNP 3,200.0 (H) 04/27/2020    PRO-BNP 2,220.0 (H) 11/15/2019    Creatinine 2.50 (H) 07/03/2021    Creatinine 2.54 (H) 07/01/2021    Creatinine 2.32 (H) 06/23/2021    Creatinine 2.47 (H) 06/18/2021    Creatinine 2.21 (H) 06/17/2021    Creatinine 0.79 01/06/2011    BUN 35 (H) 07/03/2021    BUN 35 (H) 07/01/2021    BUN 36 (H) 06/23/2021    BUN 36 (H) 06/18/2021    BUN 29 (H) 06/17/2021    BUN 20 01/06/2011    Magnesium 3.6 (H) 06/17/2021    Magnesium 3.0 (H) 06/09/2021    Magnesium 3.6 (H) 06/04/2021    Magnesium 3.9 (H) 06/03/2021    Magnesium 2.2 04/25/2021    Magnesium 2.0 01/06/2011       Metric Tracker:  Did today's visit result in [name]? No  Did today's visit result in hospital admission? No  Did today's visit result in referral to cardiology? n/a  Today's visit was a referral from Internal Medicine

## 2021-07-08 ENCOUNTER — Ambulatory Visit: Admit: 2021-07-08 | Discharge: 2021-07-09 | Payer: MEDICARE | Attending: Adult Health | Primary: Adult Health

## 2021-07-08 DIAGNOSIS — I5032 Chronic diastolic (congestive) heart failure: Principal | ICD-10-CM

## 2021-07-08 LAB — BASIC METABOLIC PANEL
ANION GAP: 3 mmol/L — ABNORMAL LOW (ref 5–14)
BLOOD UREA NITROGEN: 33 mg/dL — ABNORMAL HIGH (ref 9–23)
BUN / CREAT RATIO: 13
CALCIUM: 8.9 mg/dL (ref 8.7–10.4)
CHLORIDE: 100 mmol/L (ref 98–107)
CO2: 34.6 mmol/L — ABNORMAL HIGH (ref 20.0–31.0)
CREATININE: 2.46 mg/dL — ABNORMAL HIGH
EGFR CKD-EPI (2021) FEMALE: 19 mL/min/{1.73_m2} — ABNORMAL LOW (ref >=60)
GLUCOSE RANDOM: 165 mg/dL (ref 70–179)
POTASSIUM: 5.3 mmol/L — ABNORMAL HIGH (ref 3.4–4.8)
SODIUM: 138 mmol/L (ref 135–145)

## 2021-07-08 LAB — MAGNESIUM: MAGNESIUM: 4.1 mg/dL — ABNORMAL HIGH (ref 1.6–2.6)

## 2021-07-08 LAB — CBC
HEMATOCRIT: 28.5 % — ABNORMAL LOW (ref 34.0–44.0)
HEMOGLOBIN: 8.8 g/dL — ABNORMAL LOW (ref 11.3–14.9)
MEAN CORPUSCULAR HEMOGLOBIN CONC: 30.9 g/dL — ABNORMAL LOW (ref 32.0–36.0)
MEAN CORPUSCULAR HEMOGLOBIN: 24.1 pg — ABNORMAL LOW (ref 25.9–32.4)
MEAN CORPUSCULAR VOLUME: 77.8 fL (ref 77.6–95.7)
MEAN PLATELET VOLUME: 7.9 fL (ref 6.8–10.7)
PLATELET COUNT: 235 10*9/L (ref 150–450)
RED BLOOD CELL COUNT: 3.66 10*12/L — ABNORMAL LOW (ref 3.95–5.13)
RED CELL DISTRIBUTION WIDTH: 24.1 % — ABNORMAL HIGH (ref 12.2–15.2)
WBC ADJUSTED: 7.7 10*9/L (ref 3.6–11.2)

## 2021-07-08 LAB — B-TYPE NATRIURETIC PEPTIDE: B-TYPE NATRIURETIC PEPTIDE: 696.86 pg/mL — ABNORMAL HIGH (ref <=100)

## 2021-07-08 MED ORDER — METOLAZONE 5 MG TABLET
ORAL_TABLET | Freq: Every day | ORAL | 0 refills | 12.00000 days | Status: CP | PRN
Start: 2021-07-08 — End: 2021-08-07

## 2021-07-08 NOTE — Unmapped (Signed)
[place] [name] Note    Primary Cardiologist/Cardiology Provider(s):  Dr Wonda Cheng  PCP:  Jacquiline Doe, MD    Last Cardiology Clinic Visit:  06/20/2021  Last [name] Visit: 07/04/21    Reason for Visit:  Barbara Huber is a 80 y.o. female with a history of diastolic heart failure, who is being seen today in the [name] for an urgent visit for volume status optimization including [name].    Assessment/Plan:  1. Acute on Chronic diastolic heart failure  - Volume status today is increased/unchanged. Weight is up about 10lbs from baseline of 214-215lbs. She has LE edema but no SOB.   - NYHA Class II  - IV Lasix was not administered during clinic with plans to adjust oral diuretics  - Labs were obtained: Cr 2.46, K 5.3  - Continue torsemide 40mg  BID until weight is 215lbs or less  - Metolazone 5mg  x1 tomorrow  - Continue spironolactone 25mg  daily for now - if K still elevated in follow-up can stop  - Mg elevated - advised pt/daughter to check any supplements at home and stop if taking anything with magnesium    Response to [name]:         Medications Heart rate Blood pressure Weight (lbs)   Pre  66 134/58 226.5   Hour 0 IV Lasix 0 mg      Hour 2 IV Lasix 0 mg      Post         Output:  I/O  Timeline    None          Return to clinic:    Return in about 2 days (around 07/10/2021) for Diuresis Clinic.    Future Appointments   Date Time Provider Department Center   07/10/2021  4:10 PM Artelia Laroche, MD [facility]   07/15/2021  2:45 PM UNCTIF 24 [facility]   07/22/2021  2:45 PM [facility] 24 [facility]   07/29/2021  2:45 PM [facility] 21 [facility]   08/05/2021  2:45 PM UNCTIF 15 [facility]   08/12/2021  2:45 PM UNCTIF RN 6 [facility]   08/15/2021 12:30 PM H[facility] CT RM 2 [facility]   08/15/2021  2:45 PM Vickii Penna, MD [facility]   08/29/2021 10:00 AM Carin Hock, MD [facility] 09/03/2021  2:30 PM Melanee Left, MD [facility]       History of Present Illness:  Barbara Huber is a 80 y.o. female with a history of diastolic heart failure who presents today for [name].     Patient seen by her PCP 7/7.  At that visit, she denied worsening SOB and was feeling great, but having more LE edema than usual. She was taking torsemide 40mg  daily. Per notes, she was discharged from hospital on 6/8 on torsemide 20mg  daily. However, pt states she continued on 40mg  daily. On 6/21, PCP recommended she reduce it to every other day, but she never did that because she started having more LE edema. She has continued on torsemide 40mg  daily and spironolactone 25mg  daily. Her weight has gradually increased from dry weight of 214lbs-215lbs up to 223-224lbs. She hasn't had rapid weight gain, just a slow gain over the last several weeks corresponding to worsening LE edema. Legs were not swollen like this when weight was down at goal. They are better in the morning, but not normal.  She denies SOB and actually feels much better than she did a few months or weeks ago. She denies orthopnea/PND. She might be a little bloated at at times.     At diuresis clinic visit 7/8 she was given IV Lasix 80mg  x2 with fair response. Torsemide was increased to 40mg  BID.     Since her last visit, her weight is unchanged, at 225lbs at home. Her LE edema has not improved. She is still not SOB, but feeling tired. She only took torsemide 40mg  yesterday because the 40mg  BID wasn't helping her LE edema. She held Januvia starting yesterday - pt and daughter read the pamphlet on it and noted side effects consistent with what she's experiencing, including LE edema. She does think swelling worsened after starting it. She also recently developed hives on her arms.     Past Medical History:   Diagnosis Date   ??? Acute kidney injury superimposed on chronic kidney disease (CMS-HCC) 10/11/2020   ??? Acute on chronic diastolic (congestive) heart failure (CMS-HCC) 08/23/2020   ??? Arthritis    ??? Calculus of kidney    ??? Calculus of ureter    ??? CHF (congestive heart failure) (CMS-HCC)    ??? Chronic atrial fibrillation (CMS-HCC) 07/20/2019   ??? COPD (chronic obstructive pulmonary disease) (CMS-HCC)    ??? Diabetes (CMS-HCC)    ??? Gangrenous cholecystitis 10/11/2020   ??? GERD (gastroesophageal reflux disease)    ??? Hydronephrosis    ??? Hypertension    ??? Hyponatremia 10/11/2020   ??? Intermediate coronary syndrome (CMS-HCC) 03/13/2014   ??? Lower extremity edema 09/28/2020   ??? Lumbar stenosis    ??? Microscopic hematuria    ??? Nausea alone    ??? Nephrolithiasis 04/17/2016   ??? Neuropathy    ??? Nocturia    ??? Other chronic cystitis    ??? Pulmonary hypertension (CMS-HCC)    ??? Renal colic    ??? Sleep apnea    ??? Unstable angina pectoris (CMS-HCC) 03/13/2014     Past Surgical History:   Procedure Laterality Date   ??? BACK SURGERY  1995   ??? CARPAL TUNNEL RELEASE Left 2014   ??? HYSTERECTOMY  1971   ??? IR INSERT CHOLECYSTOSMY TUBE PERCUTANEOUS  10/02/2020    IR INSERT CHOLECYSTOSMY TUBE PERCUTANEOUS 10/02/2020 Braulio Conte, MD IMG VIR [facility]   ??? LUMBAR DISC SURGERY     ??? PR REMOVAL GALLBLADDER N/A 10/06/2020    Procedure: CHOLECYSTECTOMY;  Surgeon: Katherina Mires, MD;  Location: MAIN OR [facility];  Service: Trauma   ??? PR RIGHT HEART CATH O2 SATURATION & CARDIAC OUTPUT N/A 09/30/2020    Procedure: Right Heart Catheterization;  Surgeon: Neal Dy, MD;  Location: [facility] CATH;  Service: Cardiology   ??? PR RIGHT HEART CATH O2 SATURATION & CARDIAC OUTPUT N/A 01/09/2021    Procedure: Right Heart Catheterization;  Surgeon: Lesle Reek, MD;  Location: [facility] CATH;  Service: Cardiology       Current Outpatient Medications   Medication Sig Dispense Refill   ??? ACCU-CHEK AVIVA PLUS TEST STRP Strp USE TO CHECK BLOOD SUGAR 3 TIMES A DAY BEFORE MEALS     ??? acetaminophen (TYLENOL) 500 MG tablet Take 1,000 mg by mouth daily as needed for pain.      ??? albuterol HFA 90 mcg/actuation inhaler Inhale 2 puffs every eight (8) hours as needed for wheezing.     ??? amiodarone (PACERONE) 200 MG tablet Take 1 tablet (200 mg total) by mouth daily. 30  tablet 1   ??? apixaban (ELIQUIS) 5 mg Tab Take 0.5 tablets (2.5 mg total) by mouth Two (2) times a day. 60 tablet 11   ??? estradioL (ESTRACE) 0.01 % (0.1 mg/gram) vaginal cream INSERT ONE GRAM VAGINALLY AT BEDTIME     ??? ferrous sulfate 325 (65 FE) MG tablet Take 1 tablet (325 mg total) by mouth every other day. 30 tablet 3   ??? fluticasone propion-salmeteroL (ADVAIR HFA) 115-21 mcg/actuation inhaler Inhale 2 puffs Two (2) times a day. 12 g 11   ??? fosfomycin (MONUROL) 3 gram Pack Take 3 g by mouth once a week for 12 doses. 36 g 0   ??? gabapentin (NEURONTIN) 300 MG capsule Take 1 capsule (300 mg total) by mouth Three (3) times a day. 90 capsule 0   ??? lancets Misc 1 each by Miscellaneous route daily. Accu Check 100 each 6   ??? montelukast (SINGULAIR) 10 mg tablet Take 1 tablet (10 mg total) by mouth daily. 90 tablet 3   ??? oxyCODONE (OXYCONTIN) 10 mg TR12 12 hr crush resistant ER/CR tablet Take 10 mg by mouth every twelve (12) hours.     ??? OXYGEN-AIR DELIVERY SYSTEMS MISC 5 L by Miscellaneous route. 3 L/min via [place]     ??? pantoprazole (PROTONIX) 20 MG tablet Take 1 tablet (20 mg total) by mouth in the morning. 60 tablet 5   ??? rosuvastatin (CRESTOR) 5 MG tablet Take 1 tablet (5 mg total) by mouth every other day. 3015 tablet 11   ??? spironolactone (ALDACTONE) 25 MG tablet Take 25 mg by mouth in the morning.     ??? tafamidis 61 mg cap Take 1 capsule (61 mg) by mouth daily. 30 capsule 11   ??? tiotropium bromide (SPIRIVA RESPIMAT) 2.5 mcg/actuation inhalation mist Inhale 2 puffs daily. 4 g 11   ??? torsemide (DEMADEX) 20 MG tablet Take 1 tablet (20 mg total) by mouth daily. May also take 1 tablet (20 mg total) daily as needed (weight gain). 60 tablet 11   ??? metOLazone (ZAROXOLYN) 5 MG tablet Take 1 tablet (5 mg total) by mouth as needed in the morning (when instructed by cardiology clinic). 12 tablet 0   ??? NARCAN 4 mg/actuation nasal spray 1 spray into alternating nostrils once as needed (opioid overdose). (Patient not taking: Reported on 07/08/2021)     ??? NON FORMULARY APPLY FROM NECK DOWN TWICE A DAY     ??? SITagliptin (JANUVIA) 25 MG tablet Take 1 tablet (25 mg total) by mouth in the morning. (Patient not taking: Reported on 07/08/2021) 30 tablet 11   ??? varicella-zoster [id number], PF, (SHINGRIX, PF,) 50 mcg/0.5 mL SusR injection Inject 0.5 mL into the muscle. 0.5 mL 1     No current facility-administered medications for this visit.     Allergies   Allergen Reactions   ??? Nitrofurantoin Anaphylaxis and Other (See Comments)   ??? Lipitor [Atorvastatin] Muscle Pain     SOB, Headache, fatigue, sick on my stomach       Objective:       Physical Exam  BP 134/58 (BP Site: R Arm, BP Position: Sitting, BP Cuff Size: Medium)  - Pulse 66  - Ht 167.6 cm (5' 6)  - Wt (!) 102.7 kg (226 lb 8 oz)  - SpO2 91%  - BMI 36.56 kg/m??    Wt Readings from Last 12 Encounters:   07/08/21 (!) 102.7 kg (226 lb 8 oz)   07/04/21 (!) 101.6 kg (224 lb)  07/03/21 (!) 101.6 kg (224 lb)   06/18/21 97.5 kg (215 lb)   06/17/21 97.5 kg (215 lb)   06/12/21 98 kg (216 lb)   06/03/21 96.2 kg (212 lb)   06/02/21 96.2 kg (212 lb)   05/23/21 95.7 kg (211 lb)   05/06/21 97.8 kg (215 lb 9.6 oz)   04/25/21 96 kg (211 lb 11.2 oz)   04/21/21 (!) 101.4 kg (223 lb 8 oz)       General:  Patient is well-appearing in no acute distress   Eyes:  Intact, sclerae anicteric.   Ears, nose, mouth: Benign   Respiratory:   Normal respiratory effort. Clear to auscultation bilaterally.  There are no wheezes.   Cardiovascular:  JVP 3/4 up neck with HOB at 90degrees.  Rate and rhythm are regular.  There is no lifts or heaves.  Normal S1, S2. There is no murmur, gallops or rubs.  Radial and pedal pulses are 2+, bilaterally.   There is 2+ pitting pedal/pretibial edema, bilaterally to below knees.    Gastrointestinal:   Soft, non-tender, with audible bowel sounds. Abdomen nondistended.  Liver is nonpalpable.   Musculoskeletal: No joint swelling   Skin: Warm, well perfused.   Neurologic: Appropriate mood and affect. Alert and oriented to person, place, and time. No gross motor or sensory deficits evident.     Recent Labs:  Office Visit on 07/08/2021   Component Date Value Ref Range Status   ??? WBC 07/08/2021 7.7  3.6 - 11.2 10*9/L Final   ??? RBC 07/08/2021 3.66 (A) 3.95 - 5.13 10*12/L Final   ??? HGB 07/08/2021 8.8 (A) 11.3 - 14.9 g/dL Final   ??? HCT 16/09/9603 28.5 (A) 34.0 - 44.0 % Final   ??? MCV 07/08/2021 77.8  77.6 - 95.7 fL Final   ??? MCH 07/08/2021 24.1 (A) 25.9 - 32.4 pg Final   ??? MCHC 07/08/2021 30.9 (A) 32.0 - 36.0 g/dL Final   ??? RDW 54/08/8118 24.1 (A) 12.2 - 15.2 % Final   ??? MPV 07/08/2021 7.9  6.8 - 10.7 fL Final   ??? Platelet 07/08/2021 235  150 - 450 10*9/L Final   ??? Magnesium 07/08/2021 4.1 (A) 1.6 - 2.6 mg/dL Final   ??? BNP 14/78/2956 696.86 (A) <=100 pg/mL Final   ??? Sodium 07/08/2021 138  135 - 145 mmol/L Final   ??? Potassium 07/08/2021 5.3 (A) 3.4 - 4.8 mmol/L Final   ??? Chloride 07/08/2021 100  98 - 107 mmol/L Final   ??? CO2 07/08/2021 34.6 (A) 20.0 - 31.0 mmol/L Final   ??? Anion Gap 07/08/2021 3 (A) 5 - 14 mmol/L Final   ??? BUN 07/08/2021 33 (A) 9 - 23 mg/dL Final   ??? Creatinine 07/08/2021 2.46 (A) 0.60 - 0.80 mg/dL Final   ??? BUN/Creatinine Ratio 07/08/2021 13   Final   ??? eGFR [name]-EPI (2021) Female 07/08/2021 19 (A) >=60 mL/min/1.59m2 Final   ??? Glucose 07/08/2021 165  70 - 179 mg/dL Final   ??? Calcium 21/30/8657 8.9  8.7 - 10.4 mg/dL Final   Office Visit on 07/03/2021   Component Date Value Ref Range Status   ??? EKG Ventricular Rate 07/03/2021 64  BPM Preliminary   ??? EKG Atrial Rate 07/03/2021 64  BPM Preliminary   ??? EKG P-R Interval 07/03/2021 206  ms Preliminary   ??? EKG QRS Duration 07/03/2021 90  ms Preliminary   ??? EKG Q-T Interval 07/03/2021 482  ms Preliminary   ??? EKG QTC Calculation 07/03/2021 497  ms Preliminary   ??? EKG Calculated P Axis 07/03/2021 62  degrees Preliminary   ??? EKG Calculated R Axis 07/03/2021 85  degrees Preliminary   ??? EKG Calculated T Axis 07/03/2021 46  degrees Preliminary   ??? QTC Fredericia 07/03/2021 492  ms Preliminary   ??? Sodium 07/03/2021 137  135 - 145 mmol/L Final   ??? Potassium 07/03/2021 4.8  3.4 - 4.8 mmol/L Final   ??? Chloride 07/03/2021 98  98 - 107 mmol/L Final   ??? CO2 07/03/2021 32.9 (A) 20.0 - 31.0 mmol/L Final   ??? Anion Gap 07/03/2021 6  5 - 14 mmol/L Final   ??? BUN 07/03/2021 35 (A) 9 - 23 mg/dL Final   ??? Creatinine 07/03/2021 2.50 (A) 0.60 - 0.80 mg/dL Final   ??? BUN/Creatinine Ratio 07/03/2021 14   Final   ??? eGFR [name]-EPI (2021) Female 07/03/2021 19 (A) >=60 mL/min/1.21m2 Final   ??? Glucose 07/03/2021 119  70 - 179 mg/dL Final   ??? Calcium 16/09/9603 9.1  8.7 - 10.4 mg/dL Final   ??? Albumin 54/08/8118 4.0  3.4 - 5.0 g/dL Final   ??? Total Protein 07/03/2021 7.4  5.7 - 8.2 g/dL Final   ??? Total Bilirubin 07/03/2021 0.4  0.3 - 1.2 mg/dL Final   ??? AST 14/78/2956 10  <=34 U/L Final   ??? ALT 07/03/2021 8 (A) 10 - 49 U/L Final   ??? Alkaline Phosphatase 07/03/2021 61  46 - 116 U/L Final   ??? PRO-BNP 07/03/2021 3,420.0 (A) 0.0 - 450.0 pg/mL Final   ??? Color, UA 07/03/2021 Yellow   Final   ??? Clarity, UA 07/03/2021 Clear   Final   ??? Specific Gravity, UA 07/03/2021 1.010  1.005 - 1.030 Final   ??? pH, UA 07/03/2021 7.5  5.0 - 9.0 Final   ??? Leukocyte Esterase, UA 07/03/2021 Negative  Negative Final   ??? Nitrite, UA 07/03/2021 Negative  Negative Final   ??? Protein, UA 07/03/2021 Negative  Negative Final   ??? Glucose, UA 07/03/2021 Negative  Negative Final   ??? Ketones, UA 07/03/2021 Negative  Negative Final   ??? Urobilinogen, UA 07/03/2021 0.2 mg/dL  0.2 - 2.0 mg/dL Final   ??? Bilirubin, UA 07/03/2021 Negative  Negative Final   ??? Blood, UA 07/03/2021 Negative  Negative Final   ??? RBC, UA 07/03/2021 <1  <4 /HPF Final   ??? WBC, UA 07/03/2021 1  0 - 5 /HPF Final   ??? Squam Epithel, UA 07/03/2021 7 (A) 0 - 5 /HPF Final   ??? Bacteria, UA 07/03/2021 Many (A) None Seen /HPF Final   ??? Hyaline Casts, UA 07/03/2021 3 (A) 0 - 1 /LPF Final   ??? WBC 07/03/2021 7.6  3.6 - 11.2 10*9/L Final   ??? RBC 07/03/2021 3.78 (A) 3.95 - 5.13 10*12/L Final   ??? HGB 07/03/2021 8.8 (A) 11.3 - 14.9 g/dL Final   ??? HCT 21/30/8657 29.7 (A) 34.0 - 44.0 % Final   ??? MCV 07/03/2021 78.5  77.6 - 95.7 fL Final   ??? MCH 07/03/2021 23.2 (A) 25.9 - 32.4 pg Final   ??? MCHC 07/03/2021 29.5 (A) 32.0 - 36.0 g/dL Final   ??? RDW 84/69/6295 23.9 (A) 12.2 - 15.2 % Final   ??? MPV 07/03/2021 8.1  6.8 - 10.7 fL Final   ??? Platelet 07/03/2021 269  150 - 450 10*9/L Final   ??? Neutrophils % 07/03/2021 66.0  % Final   ??? Lymphocytes % 07/03/2021 16.9  % Final   ???  Monocytes % 07/03/2021 12.8  % Final   ??? Eosinophils % 07/03/2021 3.0  % Final   ??? Basophils % 07/03/2021 1.3  % Final   ??? Absolute Neutrophils 07/03/2021 5.0  1.8 - 7.8 10*9/L Final   ??? Absolute Lymphocytes 07/03/2021 1.3  1.1 - 3.6 10*9/L Final   ??? Absolute Monocytes 07/03/2021 1.0 (A) 0.3 - 0.8 10*9/L Final   ??? Absolute Eosinophils 07/03/2021 0.2  0.0 - 0.5 10*9/L Final   ??? Absolute Basophils 07/03/2021 0.1  0.0 - 0.1 10*9/L Final   ??? Anisocytosis 07/03/2021 Marked (A) Not Present Final   ??? Smear Review Comments 07/03/2021 See Comment (A) Undefined Final   ??? Polychromasia 07/03/2021 Slight (A) Not Present Final   ??? Acanthocytes 07/03/2021 Present (A) Not Present Final       Lab Results   Component Value Date    PRO-BNP 3,420.0 (H) 07/03/2021    PRO-BNP 905.0 (H) 03/21/2021    PRO-BNP 3,796.0 (H) 12/23/2020    PRO-BNP 1,970.0 (H) 05/30/2020    PRO-BNP 3,200.0 (H) 04/27/2020    Creatinine 2.46 (H) 07/08/2021    Creatinine 2.50 (H) 07/03/2021    Creatinine 2.54 (H) 07/01/2021    Creatinine 2.32 (H) 06/23/2021    Creatinine 2.47 (H) 06/18/2021    Creatinine 0.79 01/06/2011    BUN 33 (H) 07/08/2021    BUN 35 (H) 07/03/2021    BUN 35 (H) 07/01/2021    BUN 36 (H) 06/23/2021    BUN 36 (H) 06/18/2021    BUN 20 01/06/2011    Magnesium 4.1 (H) 07/08/2021    Magnesium 3.6 (H) 06/17/2021    Magnesium 3.0 (H) 06/09/2021    Magnesium 3.6 (H) 06/04/2021    Magnesium 3.9 (H) 06/03/2021    Magnesium 2.0 01/06/2011       Metric Tracker:  Did today's visit result in [name]? No  Did today's visit result in hospital admission? No  Did today's visit result in referral to cardiology? n/a  Today's visit was a referral from Internal Medicine

## 2021-07-10 ENCOUNTER — Telehealth: Admit: 2021-07-10 | Discharge: 2021-07-11 | Payer: MEDICARE

## 2021-07-10 ENCOUNTER — Ambulatory Visit: Admit: 2021-07-10 | Discharge: 2021-07-11 | Payer: MEDICARE

## 2021-07-10 DIAGNOSIS — R601 Generalized edema: Principal | ICD-10-CM

## 2021-07-10 DIAGNOSIS — E1122 Type 2 diabetes mellitus with diabetic chronic kidney disease: Principal | ICD-10-CM

## 2021-07-10 DIAGNOSIS — G629 Polyneuropathy, unspecified: Principal | ICD-10-CM

## 2021-07-10 DIAGNOSIS — N1832 Type 2 diabetes mellitus with stage 3b chronic kidney disease, without long-term current use of insulin (CMS-HCC): Principal | ICD-10-CM

## 2021-07-10 MED ORDER — AMIODARONE 200 MG TABLET
ORAL_TABLET | Freq: Every day | ORAL | 1 refills | 30 days | Status: CN
Start: 2021-07-10 — End: 2021-09-08

## 2021-07-10 MED ORDER — GABAPENTIN 300 MG CAPSULE
ORAL_CAPSULE | Freq: Three times a day (TID) | ORAL | 0 refills | 30.00000 days | Status: CN
Start: 2021-07-10 — End: 2021-08-09

## 2021-07-10 NOTE — Unmapped (Signed)
[facility] Internal Medicine at [facility]       Type of visit:  video    Reason for visit: .    Questions / Concerns that need to be addressed: .          .  __________________________________________________________________________________________    SCREENINGS COMPLETED IN FLOWSHEETS    HARK Screening       AUDIT       PHQ2       PHQ9          P4 Suicidality Screener                GAD7       COPD Assessment

## 2021-07-10 NOTE — Unmapped (Signed)
Internal Medicine Clinic Visit, Video Visit    Reason for visit: Follow up for edema     A/P:  Barbara Huber was seen today for follow-up.    Diagnoses and all orders for this visit:    Generalized edema   Assessment:  Patient with history of diastolic heart failure, cardiac amyloidosis,  AKI, and COPD was seen via video visit today for follow up on leg edema. She is followed by [facility] cardiology and was last seen at the [name] on 07/04/21 and 07/08/21 for her increased volume status. She has had a slow weight gain over the last weeks with worsening LE edema. No SOB or orthopnea. Currently taking spironolactone 25mg  once a day and torsemide 40mg  BID. Had one dose of Metolazone on 07/09/21, next dose scheduled from 07/11/21. The fluid retention is possibly due to exacerbation of underlying cardiac dysfunction by sitagliptin. Diff dx: worsening renal function  Plan:  ??? Diagnostics: Labs in 4 days   ??? Therapeutics: Stay off januvia. Diuretics per cardiology; will also decrease gabapentin to bid (but pt has been on it a long time)   ??? Patient Ed: Follow cardiology medication changes.     Type 2 diabetes mellitus with stage 3b chronic kidney disease, without long-term current use of insulin (CMS-HCC)   Assessment: Patient with a history of diabetes. Metformin was stopped on 06/17/21 because of decreased renal function. Sitagliptin stopped on 07/07/21 due to concern for exacerbating heart failure symptoms. Blood sugars at home:126 and 170. We will monitor blood sugars and will consider insulin in the future.  Plan:  ??? Diagnostics: None today   ??? Therapeutics: Stop Sitagliptin  ??? Patient Ed: Would like to keep blood sugar below 150 in the morning. If is does not stay under this target, we can consider starting insulin.     Neuropathy  Assessment: Patient with history of fatigue after covid and neuropathy on gabapentin. Currently on 300 mg 3 TID.   Plan: Ddecrease gabapentin to two times a day and monitor her pain symptoms. Patient says she does not need a refill.     Health Maintenance  Health Maintenance Due   Topic Date Due   ??? Retinal Eye Exam  Never done   ??? DTaP/Tdap/Td Vaccines (1 - Tdap) Never done   ??? DEXA Scan-Start Age 27  Never done   ??? COVID-19 Vaccine (4 - Booster for Pfizer series) 04/12/2021   ??? Zoster Vaccines (2 of 2) 05/30/2021      No follow-ups on file.    HPI:  Pt is a 80 y.o. female with a history of cardiac amyloidosis with heart failure, [name], Afib on Eliquis, COPD on 3L home O2, GERD, HLD, T2DM, and OSA. Joined visit via video with daughter.     Felt terrible when it was really swollen -wasn't able to move and broke out in hives- but it feeling a lot better now. Swelling is better than what is was and itching is better. No SOB. Able to move around better than last week. Stopped januvia on Monday. Blood sugars have been 126 and 170.  Blood pressure: 121/70   HR:66  Urinating ok    Meds:  Took Metolazone yesterday. [name] wants her to take another one tomorrow morning.   Spironolactone 25mg  once a day.   Torsemide 40mg  BID    Weight was at 224 this morning. Down one pound from Tuesday.    Stopped the [employer] on Wednesday.     Problem List:  Patient Active Problem List   Diagnosis   ??? Spinal stenosis of lumbar region   ??? Thoracic or lumbosacral neuritis or radiculitis   ??? Lumbosacral spondylosis   ??? COPD (chronic obstructive pulmonary disease) (CMS-HCC)   ??? Sleep apnea in adult   ??? Essential hypertension   ??? AKI (acute kidney injury) (CMS-HCC)   ??? Type 2 diabetes mellitus with stage 3b chronic kidney disease, without long-term current use of insulin (CMS-HCC)   ??? Chronic cystitis   ??? Incomplete bladder emptying   ??? Nocturia   ??? Peripheral neuropathy   ??? Chronic respiratory failure with hypoxia (CMS-HCC)   ??? Anemia   ??? Persistent atrial fibrillation (CMS-HCC)   ??? Anemia in chronic kidney disease   ??? Chronic kidney disease, stage III (moderate) (CMS-HCC)   ??? Enrolled in chronic care management   ??? Pulmonary hypertension (CMS-HCC)   ??? Shortness of breath   ??? Chronic prescription opiate use   ??? Dyspnea on exertion   ??? 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 Cardiac amyloidosis (CMS-HCC)   ??? Recurrent cold sores   ??? Persistent fatigue after COVID-19   ??? Dyspepsia   ??? Iron deficiency anemia due to chronic blood loss   ??? Generalized edema        Medications:  Reviewed in EPIC    Social History  Reviewed in [place]    Physical Exam:   Vital Signs:  Vitals:    07/10/21 1527   BP: 121/70   Pulse: 66   Weight: (!) 102.5 kg (226 lb)   Height: 167.6 cm (5' 6)     Video Visit    Lab Results   Component Value Date    CREATININE 2.46 (H) 07/08/2021    CHOL 132 08/23/2020    HDL 32 (L) 08/23/2020    LDL 78 08/23/2020    NONHDL 100 08/23/2020    TRIG 109 08/23/2020    A1C <3.8 (L) 06/02/2021         The 10-year ASCVD risk score Denman George DC Jr., et al., 2013) is: 19.6%   Medication adherence and barriers to the treatment plan have been addressed. Opportunities to optimize healthy behaviors have been discussed. Patient / caregiver voiced understanding.             _________________________  Evette Doffing. Raliegh Scarlet, MS3 [facility] [employer] [employer]   This note was written by [facility] medical student Virgia Land and edited by Jacquiline Doe, MD.   I attest that I have reviewed the medical student note and that the components of the history of the present illness, the physical exam, and the assessment and plan documented were performed by me or were performed in my presence by the student where I verified the documentation and performed (or re-performed) the exam and medical decision making. Jacquiline Doe, MD      The patient reports they are currently: at home. I spent 10 minutes on the real-time audio and video with the patient on the date of service. I spent an additional 15 minutes on pre- and post-visit activities on the date of service.     The patient was physically located in [place] or a state in which I am permitted to provide care. The patient and/or parent/guardian understood that s/he may incur co-pays and cost sharing, and agreed to the telemedicine visit. The visit was reasonable and appropriate under the circumstances given the patient's presentation at the time.  The patient and/or parent/guardian has been advised of the potential risks and limitations of this mode of treatment (including, but not limited to, the absence of in-person examination) and has agreed to be treated using telemedicine. The patient's/patient's family's questions regarding telemedicine have been answered.     If the visit was completed in an ambulatory setting, the patient and/or parent/guardian has also been advised to contact their provider???s office for worsening conditions, and seek emergency medical treatment and/or call 911 if the patient deems either necessary.

## 2021-07-14 ENCOUNTER — Ambulatory Visit
Admit: 2021-07-14 | Discharge: 2021-07-15 | Payer: MEDICARE | Attending: Nurse Practitioner | Primary: Nurse Practitioner

## 2021-07-14 DIAGNOSIS — I509 Heart failure, unspecified: Principal | ICD-10-CM

## 2021-07-14 NOTE — Unmapped (Signed)
[place] [name] Note    Primary Cardiologist/Cardiology Provider(s):  Dr Wonda Cheng  PCP:  Jacquiline Doe, MD    Last Cardiology Clinic Visit:  06/20/2021  Last [name] Visit: 07/08/21    Reason for Visit:  Barbara Huber is a 80 y.o. female with a history of diastolic heart failure, who is being seen today in the [facility] [name] [facility] for an urgent visit for volume status optimization including [name].    Assessment/Plan:  1. 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   - Volume status today is improved.  Baseline weight of 214-215lbs. Wt 7/14: 226 lbs Today wt: 217 lbs 9.6oz  - She has LE edema which is improving.  No shortness of breath or dyspnea.   - NYHA Class I-II  - IV Lasix was not administered during clinic due to volume status improving (with two doses of metolazone), only leg swelling without other signs or symptoms and Cr pending.  - Labs were obtained: (pending)    (Baseline Cr since June is around 2.4, prior Cr 1.3-1.6, nephrology appt 8/31)  - Home meds:   - Continue torsemide 40mg  BID until weight is 215lbs or less   - Metolazone 5mg  PRN (call office or message via MyChart if increase in weight by 5lbs)  - Continue spironolactone 25mg  daily for now - if K is elevated in today's labs will stop medication.     2.  Constipation  - likely multifactorial (diuretics, Fe, and oxycodone)  - Taking senna 2 pills twice a day and miralax w/o relief  -Not on any Mg  -Will continue senna 2 pills twice a day, and add dulcolax daily until bowels are regular then stop.   -Could consider lactulose if no improvement    Response to [name]:         Medications Heart rate Blood pressure Weight (lbs)   Pre  69 104/69 217lbs 9.6 oz   Hour 0 IV Lasix 0 mg      Hour 2 IV Lasix 0 mg      Post         Output:  I/O  Timeline    None          Return to clinic:    No follow-ups on file.    Future Appointments   Date Time Provider Department Center   07/15/2021  2:45 PM UNCTIF 24 [facility]   07/22/2021  2:45 PM UNCTIF 24 [facility]   07/29/2021  2:45 PM UNCTIF 21 [facility]   08/05/2021  2:45 PM UNCTIF 15 [facility]   08/12/2021  2:45 PM UNCTIF RN 6 [facility]   08/15/2021 12:30 PM HBR CT RM 2 [facility] [place] [facility]   08/15/2021  2:45 PM Vickii Penna, MD Fuller Mandril [facility]   08/29/2021 10:00 AM Carin Hock, MD Janeann Forehand [facility]   09/03/2021  2:30 PM Melanee Left, MD [facility]       History of Present Illness:  Barbara Huber is a 80 y.o. female with a history of diastolic heart failure who presents today for [name].     Patient seen by her PCP 7/7.  At that visit, she denied worsening SOB and was feeling great, but having more LE edema than usual. She was taking torsemide 40mg  daily. Per notes, she was  discharged from hospital on 6/8 on torsemide 20mg  daily. However, pt states she continued on 40mg  daily. On 6/21, PCP recommended she reduce it to every other day, but she never did that because she started having more LE edema. She has continued on torsemide 40mg  daily and spironolactone 25mg  daily. Her weight has gradually increased from dry weight of 214lbs-215lbs up to 223-224lbs.    At diuresis clinic visit 7/8 she was given IV Lasix 80mg  x2 with fair response. Torsemide was increased to 40mg  BID.     At diuresis clinic visit 7/12 her weight is unchanged, at 225lbs at home. Her LE edema has not improved. She is still not SOB, but feeling tired. She only took torsemide 40mg  yesterday because the 40mg  BID wasn't helping her LE edema. She held Januvia because pt and daughter read the pamphlet on it and noted side effects consistent with what she's experiencing, including LE edema. She does think swelling worsened after starting it. She also recently developed hives on her arms.     Telemedicine 7/14 for generalized edema taking spironolactone 25 mg once day, torsemide 40 mg BID and took one dose of metolazone 7/13 w/ next dose being 7/15.  Question if the underlying fluid retention related to sitagliptin. Recommended to say of januvia, decrease gabapentin BID and labs in 4 days.     Today, Barbara Huber presents to [name] with her daughter.  Reports improvement in weight which has been trending down as well as improvement in lower extremities swelling.  Denies chest pain, shortness of breath, dyspnea and increase oxygen requirements.  Sleeps at night with one pill.  Denies cough or orthopnea.  Overall feeling better but have issues with constipation.  Last BM today, hard to have BM.  Denies hard stools.  Has been using miralax and senna 2 pills twice a day without relief.  Denies N/V or changes in appetite.  Reports taking metolazone on 7/13 and 7/15 with good response.      Past Medical History:   Diagnosis Date   ??? Acute kidney injury superimposed on chronic kidney disease (CMS-HCC) 10/11/2020   ??? Acute on chronic diastolic (congestive) heart failure (CMS-HCC) 08/23/2020   ??? Arthritis    ??? Calculus of kidney    ??? Calculus of ureter    ??? CHF (congestive heart failure) (CMS-HCC)    ??? Chronic atrial fibrillation (CMS-HCC) 07/20/2019   ??? COPD (chronic obstructive pulmonary disease) (CMS-HCC)    ??? Diabetes (CMS-HCC)    ??? Gangrenous cholecystitis 10/11/2020   ??? GERD (gastroesophageal reflux disease)    ??? Hydronephrosis    ??? Hypertension    ??? Hyponatremia 10/11/2020   ??? Intermediate coronary syndrome (CMS-HCC) 03/13/2014   ??? Lower extremity edema 09/28/2020   ??? Lumbar stenosis    ??? Microscopic hematuria    ??? Nausea alone    ??? Nephrolithiasis 04/17/2016   ??? Neuropathy    ??? Nocturia    ??? Other chronic cystitis    ??? Pulmonary hypertension (CMS-HCC)    ??? Renal colic    ??? Sleep apnea    ??? Unstable angina pectoris (CMS-HCC) 03/13/2014     Past Surgical History:   Procedure Laterality Date   ??? BACK SURGERY  1995   ??? CARPAL TUNNEL RELEASE Left 2014 ??? HYSTERECTOMY  1971   ??? IR INSERT CHOLECYSTOSMY TUBE PERCUTANEOUS  10/02/2020    IR INSERT CHOLECYSTOSMY TUBE PERCUTANEOUS 10/02/2020 Braulio Conte, MD IMG VIR [facility]   ??? LUMBAR DISC  SURGERY     ??? PR REMOVAL GALLBLADDER N/A 10/06/2020    Procedure: CHOLECYSTECTOMY;  Surgeon: Katherina Mires, MD;  Location: MAIN [facility];  Service: Trauma   ??? PR RIGHT HEART CATH O2 SATURATION & CARDIAC OUTPUT N/A 09/30/2020    Procedure: Right Heart Catheterization;  Surgeon: Neal Dy, MD;  Location: [facility] CATH;  Service: Cardiology   ??? PR RIGHT HEART CATH O2 SATURATION & CARDIAC OUTPUT N/A 01/09/2021    Procedure: Right Heart Catheterization;  Surgeon: Lesle Reek, MD;  Location: [facility] CATH;  Service: Cardiology       Current Outpatient Medications   Medication Sig Dispense Refill   ??? acetaminophen (TYLENOL) 500 MG tablet Take 1,000 mg by mouth daily as needed for pain.      ??? albuterol HFA 90 mcg/actuation inhaler Inhale 2 puffs every eight (8) hours as needed for wheezing.     ??? apixaban (ELIQUIS) 5 mg Tab Take 0.5 tablets (2.5 mg total) by mouth Two (2) times a day. 60 tablet 11   ??? estradioL (ESTRACE) 0.01 % (0.1 mg/gram) vaginal cream INSERT ONE GRAM VAGINALLY AT BEDTIME     ??? ferrous sulfate 325 (65 FE) MG tablet Take 1 tablet (325 mg total) by mouth every other day. 30 tablet 3   ??? fluticasone propion-salmeteroL (ADVAIR HFA) 115-21 mcg/actuation inhaler Inhale 2 puffs Two (2) times a day. 12 g 11   ??? fosfomycin (MONUROL) 3 gram Pack Take 3 g by mouth once a week for 12 doses. 36 g 0   ??? metOLazone (ZAROXOLYN) 5 MG tablet Take 1 tablet (5 mg total) by mouth as needed in the morning (when instructed by cardiology clinic). 12 tablet 0   ??? montelukast (SINGULAIR) 10 mg tablet Take 1 tablet (10 mg total) by mouth daily. 90 tablet 3   ??? NARCAN 4 mg/actuation nasal spray 1 spray into alternating nostrils once as needed (opioid overdose).     ??? oxyCODONE (OXYCONTIN) 10 mg TR12 12 hr crush resistant ER/CR tablet Take 10 mg by mouth every twelve (12) hours.     ??? OXYGEN-AIR DELIVERY SYSTEMS MISC 5 L by Miscellaneous route. 3 L/min via [place]     ??? pantoprazole (PROTONIX) 20 MG tablet Take 1 tablet (20 mg total) by mouth in the morning. 60 tablet 5   ??? rosuvastatin (CRESTOR) 5 MG tablet Take 1 tablet (5 mg total) by mouth every other day. 3015 tablet 11   ??? spironolactone (ALDACTONE) 25 MG tablet Take 25 mg by mouth in the morning.     ??? tafamidis 61 mg cap Take 1 capsule (61 mg) by mouth daily. 30 capsule 11   ??? tiotropium bromide (SPIRIVA RESPIMAT) 2.5 mcg/actuation inhalation mist Inhale 2 puffs daily. 4 g 11   ??? torsemide (DEMADEX) 20 MG tablet Take 1 tablet (20 mg total) by mouth daily. May also take 1 tablet (20 mg total) daily as needed (weight gain). 60 tablet 11   ??? varicella-zoster gE-AS01B, PF, (SHINGRIX, PF,) 50 mcg/0.5 mL SusR injection Inject 0.5 mL into the muscle. 0.5 mL 1   ??? ACCU-CHEK AVIVA PLUS TEST STRP Strp USE TO CHECK BLOOD SUGAR 3 TIMES A DAY BEFORE MEALS     ??? amiodarone (PACERONE) 200 MG tablet Take 1 tablet (200 mg total) by mouth daily. 30 tablet 1   ??? gabapentin (NEURONTIN) 300 MG capsule Take 1 capsule (300 mg total) by mouth Three (3) times a day. 90 capsule 0   ??? lancets  Misc 1 each by Miscellaneous route daily. Accu Check 100 each 6   ??? NON FORMULARY APPLY FROM NECK DOWN TWICE A DAY     ??? SITagliptin (JANUVIA) 25 MG tablet Take 1 tablet (25 mg total) by mouth in the morning. (Patient not taking: No sig reported) 30 tablet 11     No current facility-administered medications for this visit.     Allergies   Allergen Reactions   ??? Nitrofurantoin Anaphylaxis and Other (See Comments)   ??? Lipitor [Atorvastatin] Muscle Pain     SOB, Headache, fatigue, sick on my stomach       Objective:       Physical Exam  BP 104/69 (BP Site: L Arm, BP Position: Sitting, BP Cuff Size: Medium)  - Pulse 69  - Ht 165.1 cm (5' 5)  - Wt 98.7 kg (217 lb 9.6 oz)  - SpO2 94%  - BMI 36.21 kg/m??    Wt Readings from Last 12 Encounters:   07/14/21 98.7 kg (217 lb 9.6 oz)   07/10/21 (!) 102.5 kg (226 lb)   07/08/21 (!) 102.7 kg (226 lb 8 oz)   07/04/21 (!) 101.6 kg (224 lb)   07/03/21 (!) 101.6 kg (224 lb)   06/18/21 97.5 kg (215 lb)   06/17/21 97.5 kg (215 lb)   06/12/21 98 kg (216 lb)   06/03/21 96.2 kg (212 lb)   06/02/21 96.2 kg (212 lb)   05/23/21 95.7 kg (211 lb)   05/06/21 97.8 kg (215 lb 9.6 oz)       General:  Patient is well-appearing in no acute distress   Eyes:  Intact, sclerae anicteric.   Ears, nose, mouth: Benign   Respiratory:   Normal respiratory effort. Clear to auscultation bilaterally.  There are no wheezes. Nasal cannula.    Cardiovascular:   HJR with sitting at 90 degrees.  Rate and rhythm are regular.  There is no lifts or heaves.  Normal S1, S2. There is no murmur, gallops or rubs.  Radial and pedal pulses are 2+, bilaterally.   There is +1-2 bilateral to mid tib/fib area.     Gastrointestinal:   Soft, non-tender, with active audible bowel sounds. Abdomen nondistended.  Liver is nonpalpable.   Musculoskeletal: No joint swelling   Skin: Warm, well perfused.   Neurologic: Appropriate mood and affect. Alert and oriented to person, place, and time. No gross motor or sensory deficits evident.     Recent Labs:  Office Visit on 07/08/2021   Component Date Value Ref Range Status   ??? WBC 07/08/2021 7.7  3.6 - 11.2 10*9/L Final   ??? RBC 07/08/2021 3.66 (A) 3.95 - 5.13 10*12/L Final   ??? HGB 07/08/2021 8.8 (A) 11.3 - 14.9 g/dL Final   ??? HCT 16/09/9603 28.5 (A) 34.0 - 44.0 % Final   ??? MCV 07/08/2021 77.8  77.6 - 95.7 fL Final   ??? MCH 07/08/2021 24.1 (A) 25.9 - 32.4 pg Final   ??? MCHC 07/08/2021 30.9 (A) 32.0 - 36.0 g/dL Final   ??? RDW 54/08/8118 24.1 (A) 12.2 - 15.2 % Final   ??? MPV 07/08/2021 7.9  6.8 - 10.7 fL Final   ??? Platelet 07/08/2021 235  150 - 450 10*9/L Final   ??? Magnesium 07/08/2021 4.1 (A) 1.6 - 2.6 mg/dL Final   ??? BNP 14/78/2956 696.86 (A) <=100 pg/mL Final   ??? Sodium 07/08/2021 138  135 - 145 mmol/L Final   ??? Potassium 07/08/2021 5.3 (A) 3.4 - 4.8  mmol/L Final   ??? Chloride 07/08/2021 100  98 - 107 mmol/L Final   ??? CO2 07/08/2021 34.6 (A) 20.0 - 31.0 mmol/L Final   ??? Anion Gap 07/08/2021 3 (A) 5 - 14 mmol/L Final   ??? BUN 07/08/2021 33 (A) 9 - 23 mg/dL Final   ??? Creatinine 07/08/2021 2.46 (A) 0.60 - 0.80 mg/dL Final   ??? BUN/Creatinine Ratio 07/08/2021 13   Final   ??? eGFR [name]-EPI (2021) Female 07/08/2021 19 (A) >=60 mL/min/1.43m2 Final   ??? Glucose 07/08/2021 165  70 - 179 mg/dL Final   ??? Calcium 16/09/9603 8.9  8.7 - 10.4 mg/dL Final       Lab Results   Component Value Date    PRO-BNP 3,420.0 (H) 07/03/2021    PRO-BNP 905.0 (H) 03/21/2021    PRO-BNP 3,796.0 (H) 12/23/2020    PRO-BNP 1,970.0 (H) 05/30/2020    PRO-BNP 3,200.0 (H) 04/27/2020    Creatinine 2.46 (H) 07/08/2021    Creatinine 2.50 (H) 07/03/2021    Creatinine 2.54 (H) 07/01/2021    Creatinine 2.32 (H) 06/23/2021    Creatinine 2.47 (H) 06/18/2021    Creatinine 0.79 01/06/2011    BUN 33 (H) 07/08/2021    BUN 35 (H) 07/03/2021    BUN 35 (H) 07/01/2021    BUN 36 (H) 06/23/2021    BUN 36 (H) 06/18/2021    BUN 20 01/06/2011    Magnesium 4.1 (H) 07/08/2021    Magnesium 3.6 (H) 06/17/2021    Magnesium 3.0 (H) 06/09/2021    Magnesium 3.6 (H) 06/04/2021    Magnesium 3.9 (H) 06/03/2021    Magnesium 2.0 01/06/2011       Metric Tracker:  Did today's visit result in [name]? No  Did today's visit result in hospital admission? No  Did today's visit result in referral to cardiology? n/a  Today's visit was a referral from Internal Medicine

## 2021-07-14 NOTE — Unmapped (Addendum)
[name] Clinic Discharge Instructions:    MEDICATIONS:  Continue with torsemide 40 mg twice a day.  We will strive for a weight less than 215lbs.  If you noticed an increase in weight by 5 lbs in 3 days. Call the office or message me via mychart.  You can try a metolazone 5 mg about 30 minute to one hour prior to the next dose of torsemide to see if helps with swelling.    Continue with Spironolactone 25 mg daily.  If potassium increases may need to temporary hold that medication.    Bowels:   Continue to take Senakot two pills twice a day due to opioids.    Add dulcolax one tablet daily until bowels are regular then stop but continue senakot.   If this doesn't work, we can add lactulose for the bowels (another laxative).   Slowly add fiber to diet increasing over several weeks.      Call if you have questions about your medications.    IMPORTANT INSTRUCTIONS:  - Weigh yourself daily in the morning; write your weight down and bring it with you next time   - Limit your fluid intake to 2 Liters (half-gallon) per day    - Limit your salt intake to 2-3 grams (2000-3000 mg) per day  - If you have a morning diuresis appointment, don't take your morning diuretic  - If you have an afternoon diuresis appointment, take your morning diuretic    LABS:  We will call you if your labs need attention    NEXT APPOINTMENT:  Return to clinic in as needed with [name]      My office number (c/o De Burrs RN) is [phone] if you need further assistance, or need to schedule with our [name] clinic in the future.    If you need to reschedule future appointments, please call [phone].   After office hours, if you have urgent questions/problems, contact the on-call cardiologist through the hospital operator: [phone].       Place constipation patient instructions here.

## 2021-07-15 ENCOUNTER — Ambulatory Visit: Admit: 2021-07-15 | Discharge: 2021-07-16 | Payer: MEDICARE

## 2021-07-15 MED ADMIN — iron sucrose (VENOFER) 200 mg in sodium chloride (NS) 0.9 % 100 mL IVPB: 200 mg | INTRAVENOUS | @ 19:00:00 | Stop: 2021-07-15

## 2021-07-15 NOTE — Unmapped (Signed)
Following up on visit from yesterday.  To review labs and medications.

## 2021-07-15 NOTE — Unmapped (Signed)
Pt to [facility]IC today for** dose of Venofer. VSS,denies complaints. PIV started.  1523 Venofer started 200 mg  1547 Venofer complete. VSS, remained 20 min post infusion for observation. Pt tolerated without issue.PIV removed- covered with 2x2 and wrapped in coban. Discharged to home with out issue

## 2021-07-17 NOTE — Unmapped (Signed)
[facility] Refill Coordination Note    Specialty Medication(s) to be Shipped:   General Specialty: Vyndamax 61mg     Other medication(s) to be shipped: No additional medications requested for fill at this time     Barbara Huber, DOB: 1941-05-01  Phone: [phone] (home)       All above HIPAA information was verified with patient.     Was a Nurse, learning disability used for this call? No    Completed refill call assessment today to schedule patient's medication shipment from the [facility] [phone]).  All relevant notes have been reviewed.     Specialty medication(s) and dose(s) confirmed: Regimen is correct and unchanged.   Changes to medications: Barbara Huber reports no changes at this time.  Changes to insurance: No  New side effects reported not previously addressed with a pharmacist or physician: None reported  Questions for the pharmacist: No    Confirmed patient received a Conservation officer, historic buildings and a Surveyor, mining with first shipment. The patient will receive a drug information handout for each medication shipped and additional FDA Medication Guides as required.       DISEASE/MEDICATION-SPECIFIC INFORMATION        N/A    SPECIALTY MEDICATION ADHERENCE     Medication Adherence    Patient reported X missed doses in the last month: 0  Specialty Medication: Vyndamax 61mg   Patient is on additional specialty medications: No  Patient is on more than two specialty medications: No              Were doses missed due to medication being on hold? No    Vyndamax 61 mg: 8 days of medicine on hand       REFERRAL TO PHARMACIST     Referral to the pharmacist: Not needed      [facility]     Shipping address confirmed in Epic.     Delivery Scheduled: Yes, Expected medication delivery date: 07/22/21.     Medication will be delivered via Same Day Courier to the prescription address in Epic WAM.    Nancy Nordmann [facility] Specialty Technician

## 2021-07-17 NOTE — Unmapped (Signed)
moved patient appointment from 9/7 to 9/14 , patient needs to confirm

## 2021-07-22 ENCOUNTER — Ambulatory Visit: Admit: 2021-07-22 | Discharge: 2021-07-22 | Payer: MEDICARE | Attending: Internal Medicine | Primary: Internal Medicine

## 2021-07-22 ENCOUNTER — Ambulatory Visit: Admit: 2021-07-22 | Discharge: 2021-07-22 | Payer: MEDICARE

## 2021-07-22 DIAGNOSIS — R234 Changes in skin texture: Principal | ICD-10-CM

## 2021-07-22 MED ADMIN — iron sucrose (VENOFER) 200 mg in sodium chloride (NS) 0.9 % 100 mL IVPB: 200 mg | INTRAVENOUS | @ 19:00:00 | Stop: 2021-07-22

## 2021-07-22 MED FILL — VYNDAMAX 61 MG CAPSULE: ORAL | 30 days supply | Qty: 30 | Fill #6

## 2021-07-22 NOTE — Unmapped (Signed)
Pt to [facility] today for #2 dose of Venofer. VSS,denies complaints. PIV started.  1511 Venofer started 200 mg  1535 Venofer complete. VSS, remained 30 min post infusion for observation. Pt tolerated without issue.PIV removed- covered with 2x2 and wrapped in coban. Discharged to home with out issue

## 2021-07-22 NOTE — Unmapped (Signed)
[place] [facility] at [facility]       Type of visit:  face to face    Reason for visit: Acute visit    Questions / Concerns that need to be addressed: Laceration left leg x10 days ago. eval per infusion team.     General Consent to Treat (GCT) for non-epic video visits only: Verbal consent      Diabetes:  ??? Regularly checking blood sugars?: no  o If yes, when? Complete log for past 7 days  Date Before Breakfast After Breakfast Before Lunch After Lunch Before Dinner After Dinner Before Bed                                                                                                                                     Hypertension:  ??? Have blood pressure cuff at home?: yes- arm cuff  ??? Regularly checking blood pressure?: yes  ??? If patient has a  record of recent blood pressures, please enter into flow sheets        Allergies reviewed: Yes    Medication reviewed: Yes  Pended refills? No        HCDM reviewed and updated in Epic:    We are working to make sure all of our patients??? wishes are updated in Epic and part of that is documenting a Environmental health practitioner for each patient  A Health Care Decision Maker is someone you choose who can make health care decisions for you if you are not able - who would you most want to do this for you????  is already up to date.      COVID-19 Vaccine Summary  Which COVID-19 Vaccine was administered  Pfizer  Type:  Dates Given:  12/12/2020     Date last COVID Positive Test:   06/03/2021        Immunization History   Administered Date(s) Administered   ??? COVID-19 VACC,MRNA,(PFIZER)(PF)(IM) 01/19/2020, 02/09/2020, 12/12/2020   ??? Influenza Vaccine Quad (IIV4 PF) 49mo+ injectable 10/16/2015, 09/08/2019, 08/30/2020   ??? Influenza Virus Vaccine, unspecified formulation 09/27/2014, 09/23/2017, 09/21/2018   ??? Influenza, High Dose (IIV4) 65 yrs & older 09/28/2016   ??? PNEUMOCOCCAL POLYSACCHARIDE 23 09/28/2016   ??? Pneumococcal Conjugate 13-Valent 08/22/2019   ??? SHINGRIX-ZOSTER VACCINE (HZV), RECOMBINANT,SUB-UNIT,ADJUVANTED IM 04/04/2021       __________________________________________________________________________________________
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
## 2021-07-22 NOTE — Unmapped (Signed)
Your wound is healing well with no sign of infection. Continue to monitor.

## 2021-07-23 DIAGNOSIS — R601 Generalized edema: Principal | ICD-10-CM

## 2021-07-23 DIAGNOSIS — E1122 Type 2 diabetes mellitus with diabetic chronic kidney disease: Principal | ICD-10-CM

## 2021-07-23 DIAGNOSIS — N1832 Type 2 diabetes mellitus with stage 3b chronic kidney disease, without long-term current use of insulin (CMS-HCC): Principal | ICD-10-CM

## 2021-07-23 DIAGNOSIS — I10 Essential (primary) hypertension: Principal | ICD-10-CM

## 2021-07-23 DIAGNOSIS — G629 Polyneuropathy, unspecified: Principal | ICD-10-CM

## 2021-07-23 NOTE — Unmapped (Signed)
I saw and evaluated the patient, participating in the key portions of the service.  I reviewed the resident’s note.  I agree with the resident’s findings and plan. Katie Yoshua Geisinger, MD

## 2021-07-23 NOTE — Unmapped (Unsigned)
[facility] Internal Medicine   CHRONIC CARE MANAGEMENT OUTREACH ENCOUNTER           Date of Service:  07/23/2021      Service:  Care Coordination - phone  Is there someone else in the room? No.   MyChart use by patient is active: yes    Post-outreach Action Items:  ??? Provider: No/none.  ??? CM: No/none.  ??? Patient: No/none.    Follow-up Next Call: N/A    Chronic Care Management (CCM) Outreach  ??? Purpose of outreach:  Medication Changes, Diabetes and Hypertension   ??? Care Manager (CM) completed the following:  o Reviewed chart  o Identified following:   - Had 7/26 resident visit for scab on leg from previous fall  ??? Pt states, Everything is going good; no changes to wound since visit 07/22/21  - Edema: Per 07/10/21 PCP visit: Stay off januvia. Diuretics per cardiology; will also decrease gabapentin to bid  - Type 2 Diabetes:   ??? Checking blood sugar? yes  ??? Sitagliptin and Metformin stopped  o Pt confirmed she is adherent to updated medication regimen  - Neuropathy: decrease gabapentin to twice daily  ??? Current Pain Score: 0    Call placed to [name] re: recent visit, diabetes, hypertension, neuropathy    Updates  ??? New concerns or symptoms: No/ none.  ??? New barriers: No/ none.  ??? Recent hospitalization / ED / Urgent Care or providers seen outside the [facility] system: No/ none.  ??? Refills needed: No/ none.  ??? Medication response/ notes: No/none.       Diabetes:  ??? Experiencing symptoms of high blood sugar?: no  ??? Experiencing symptoms of low blood sugar?: no  o Know how to treat low blood sugar?: yes  ??? Regularly checking blood sugars?: yes  o If yes, when? before breakfast      Date Before Breakfast   07/23/21 126   07/22/21 140       Hypertension:  ??? Have blood pressure cuff at home?: yes- arm cuff  ??? Regularly checking blood pressure?: yes  o Experienced really high blood pressure (systolic >180 mmHg or diastolic >110 mmHg)?: no  o Experienced really low blood pressure (systolic <90 mmHg or diastolic <60 mmHg)?: no  ??? Symptoms of low blood pressures? no  ??? Symptoms of high blood pressures? no      Date Time BP   07/23/21 am 129/73   07/22/21 am 126/70         Wrap-up  ??? Reviewed upcoming clinic appointment(s): Yes.  o Plans to keep appointment(s): Yes.  o Transportation assistance needed: No.  o Patient was encouraged to reach out to their provider with any questions or concerns  Future Appointments   Date Time Provider Department Center   07/29/2021  2:45 PM UNCTIF 21 [facility]   08/05/2021  2:45 PM UNCTIF 01 [facility]   08/12/2021  2:45 PM UNCTIF RN 6 [facility]   08/15/2021 12:30 PM [facility] CT RM 2 [facility] [place] [facility]   08/15/2021  2:45 PM Vickii Penna, MD [facility]   08/29/2021 10:00 AM Carin Hock, MD [facility]   09/10/2021  2:30 PM Melanee Left, MD [facility]       A copy of this Patient Outreach/CCM Encounter was sent to patient's Primary Care Provider     CCM Documentation  Time spent in indirect patient care and coordination: 15 min  Time  spent in direct care with patient and/or health care proxy via non-in-person encounter(s): 10 min

## 2021-07-25 DIAGNOSIS — E1122 Type 2 diabetes mellitus with diabetic chronic kidney disease: Principal | ICD-10-CM

## 2021-07-25 DIAGNOSIS — N1832 Type 2 diabetes mellitus with stage 3b chronic kidney disease, without long-term current use of insulin (CMS-HCC): Principal | ICD-10-CM

## 2021-07-25 DIAGNOSIS — G6289 Other specified polyneuropathies: Principal | ICD-10-CM

## 2021-07-25 NOTE — Unmapped (Signed)
I was the supervising physician in the delivery of the service. Corri Delapaz R Chrisma Hurlock, MD

## 2021-07-25 NOTE — Unmapped (Signed)
I was the supervising physician in the delivery of the service. Jewett Mcgann R Prudy Candy, MD

## 2021-07-25 NOTE — Unmapped (Signed)
[facility] Internal Medicine   CARE MANAGEMENT ENCOUNTER           Date of Service:  07/25/2021      Service:  Care Coordination - phone    Purpose of contact:     Care Assistant (CA) received request for 1 month follow up appt with PCP to discuss diabetes.    Care Assistant (CA) completed the following related to the above request:  ??? Placed call to patient and scheduled appt with Dr. Brooke Dare for 08/27/21    Patient was encouraged to reach out to their provider with any questions or concerns.    A copy of Care Management Encounter note/information was sent to provider.    Time spent: 5 min

## 2021-07-29 ENCOUNTER — Ambulatory Visit: Admit: 2021-07-29 | Discharge: 2021-07-30 | Payer: MEDICARE

## 2021-07-29 DIAGNOSIS — I5032 Chronic diastolic (congestive) heart failure: Principal | ICD-10-CM

## 2021-07-29 MED ADMIN — iron sucrose (VENOFER) 200 mg in sodium chloride (NS) 0.9 % 100 mL IVPB: 200 mg | INTRAVENOUS | @ 19:00:00 | Stop: 2021-07-29

## 2021-07-29 NOTE — Unmapped (Signed)
Pt to [facility] today for** dose of Venofer. VSS,denies complaints. PIV started.  1513 Venofer started 200 mg  1536 Venofer complete. VSS, remained 30 min post infusion for observation. Pt tolerated without issue.PIV removed- covered with 2x2 and wrapped in coban. Discharged to home with out issue

## 2021-08-01 DIAGNOSIS — I509 Heart failure, unspecified: Principal | ICD-10-CM

## 2021-08-01 NOTE — Unmapped (Signed)
Opened in error

## 2021-08-05 DIAGNOSIS — E119 Type 2 diabetes mellitus without complications: Principal | ICD-10-CM

## 2021-08-05 DIAGNOSIS — N390 Urinary tract infection, site not specified: Principal | ICD-10-CM

## 2021-08-05 MED ORDER — LANCETS
Freq: Every day | 6 refills | 0.00000 days | Status: CP
Start: 2021-08-05 — End: ?

## 2021-08-05 MED ORDER — FOSFOMYCIN TROMETHAMINE 3 GRAM ORAL PACKET
ORAL | 0 refills | 42 days | Status: CP
Start: 2021-08-05 — End: 2021-09-19

## 2021-08-05 MED ORDER — ACCU-CHEK AVIVA PLUS TEST STRIPS
SUBCUTANEOUS | 11 refills | 0.00000 days | Status: CP
Start: 2021-08-05 — End: ?

## 2021-08-06 NOTE — Unmapped (Signed)
Refill per protocol

## 2021-08-08 MED ORDER — SPIRONOLACTONE 50 MG TABLET
Freq: Every day | ORAL | 0 days
Start: 2021-08-08 — End: ?

## 2021-08-12 NOTE — Unmapped (Signed)
[facility] Refill Coordination Note    Specialty Medication(s) to be Shipped:   General Specialty: Vyndamax 61mg     Other medication(s) to be shipped: No additional medications requested for fill at this time     Barbara Huber, DOB: Mar 25, 1941  Phone: [phone] (home)       All above HIPAA information was verified with patient.     Was a Nurse, learning disability used for this call? No    Completed refill call assessment today to schedule patient's medication shipment from the [facility] [phone]).  All relevant notes have been reviewed.     Specialty medication(s) and dose(s) confirmed: Regimen is correct and unchanged.   Changes to medications: Jacci reports no changes at this time.  Changes to insurance: No  New side effects reported not previously addressed with a pharmacist or physician: None reported  Questions for the pharmacist: No    Confirmed patient received a Conservation officer, historic buildings and a Surveyor, mining with first shipment. The patient will receive a drug information handout for each medication shipped and additional FDA Medication Guides as required.       DISEASE/MEDICATION-SPECIFIC INFORMATION        N/A    SPECIALTY MEDICATION ADHERENCE     Medication Adherence    Patient reported X missed doses in the last month: 0  Specialty Medication: Vyndamax 61mg   Patient is on additional specialty medications: No  Patient is on more than two specialty medications: No              Were doses missed due to medication being on hold? No    Vyndamax 61 mg: 8-10 days of medicine on hand       REFERRAL TO PHARMACIST     Referral to the pharmacist: Not needed      [name]     Shipping address confirmed in Epic.     Delivery Scheduled: Yes, Expected medication delivery date: 08/19/21.     Medication will be delivered via Same Day Courier to the prescription address in Epic WAM.    Nancy Nordmann [facility] Specialty Technician

## 2021-08-15 ENCOUNTER — Ambulatory Visit: Admit: 2021-08-15 | Discharge: 2021-08-16 | Payer: MEDICARE

## 2021-08-15 DIAGNOSIS — N390 Urinary tract infection, site not specified: Principal | ICD-10-CM

## 2021-08-15 NOTE — Unmapped (Signed)
Cystoscopy Procedure Note  Barbara Huber presents today for cystoscopy.     Indications: Barbara Huber 80 y.o. year old with history of recurrent UTIs on fosfomycin prophylaxis followed by APP Lorbacher presenting for diagnostic cystoscopy.  Please see most recent urology note on 06/23/2021 for further details.  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  As such it was favored to represent chronic inflammation. In interim she notes no more UTIs since starting abx prophylaxis.  Currently scheduled to be seen by ID in follow-up in early September.    Diagnosis: Recurrent UTI    Time out was performed 2:42 PM    PROCEDURE: The risks of flexible cystourethroscopy which include but are not limited to infection, bleeding, and pain were explained and written informed consent was obtained.      Patient was brought into the procedure room. Patient was then placed in the supine position, prepped with betadine and sterilely draped. A timeout was performed confirming patient name, MRN, and procedure. Local anesthetic was administered per urethra with 2% lidocaine gel.     The urethra was cannulated with the flexible cystoscope, the bladder was inspected in its entirety. No tumors, stones, diverticulum, or lesions were identified; trace debris in trigone consistent with mild cystocele that was irrigated out.  The bilateral ureteral orifices were normal.  Upon retroflexion of the cystoscope, the bladder neck was normal.  The urethra appeared normal, no lesions noted.      Findings:  Some trace debris in trigone likely 2/2 mild cystocele; otherwise unremarkable cystoscopy    The patient tolerated the procedure and there were no complications.    The attending surgeon was present throughout the duration of the procedure and did perform all of the key aspects.      Assessment/Plan:  Barbara Huber 80 y.o. year old with recurrent UTIs presenting for diagnostic cystoscopy today that revealed trace debris in setting of mild cystocele; otherwise unremarkable.  CT A/P today reveals stable renal pelvis lesion.  [ ]  UTI prevention as documented in APP Lorbacher's note from 06/23/2021  [ ]  RTC with Barbara Huber PRN  [ ]  Follow-up with ID as previously scheduled; may consider intermittent abx holidays but will ultimately defer to colleagues from ID    Candace Cruise. Dow Adolph, MD MPH  Assistant Professor  Department of Urology   [facility]  [id number]  [place], [place] [address]  p [phone] f [phone]

## 2021-08-19 DIAGNOSIS — N398 Other specified disorders of urinary system: Principal | ICD-10-CM

## 2021-08-19 DIAGNOSIS — R93429 Abnormal radiologic findings on diagnostic imaging of unspecified kidney: Principal | ICD-10-CM

## 2021-08-19 MED FILL — VYNDAMAX 61 MG CAPSULE: ORAL | 30 days supply | Qty: 30 | Fill #7

## 2021-08-19 NOTE — Unmapped (Signed)
patient appointment has been moved to 9/21 at 3pm, because provider will not be in clinic 9/14 , patient needs to confirm

## 2021-08-19 NOTE — Unmapped (Signed)
Can you let her know that we continue to see persistent right mild urothelial thickening that is stable from prior CT and likely represents inflammation. We could always look into her ureter tube or repeat imaging in 6 months or so if further concerns. Placed order for rus in 6 months.     She should follow up with pcp regarding incidental findings on CT.       CT Abdomen Pelvis Wo Contrast (Stone Protocol)  Order: [id number]   Status: Final result ??   Visible to patient: Yes (not seen) ??   Dx: Abnormal CT scan, kidney; Urothelial ... ??   0 Result Notes    Details    Reading Physician Reading Date Result Priority   Lauren Suella Grove, MD  [phone] 08/15/2021    Pixie Casino, MD  [phone] 08/15/2021    One [facility] 08/15/2021      Narrative & Impression  EXAM: CT ABDOMEN PELVIS WO CONTRAST  DATE: 08/15/2021 12:55 PM  ACCESSION: [id number]  DICTATED: 08/15/2021 1:00 PM  INTERPRETATION LOCATION: [facility]  ??  CLINICAL INDICATION: 80 years old Female with urothelial thickening  - N39.8 - Urothelial lesion - R93.429 - Abnormal CT scan, kidney    ??  COMPARISON: CT abdomen/pelvis dated 01/23/2021  ??  TECHNIQUE: A spiral CT scan was obtained without IV contrast from the lung bases to the pubic symphysis.  Images were reconstructed in the axial plane. Coronal and sagittal reformatted images were also provided for further evaluation.  ??  Evaluation of the solid organs and vasculature is limited in the absence of intravenous contrast.  ??  FINDINGS:   ??  LOWER CHEST: Bibasilar subsegmental dependent atelectasis. No pleural effusion. No pulmonary nodules. Visualized portions of the heart are unremarkable  ??  ABDOMEN/PELVIS  ??  HEPATOBILIARY: No focal liver lesion on non contrast examination. Normal hepatic contour. No biliary ductal dilatation. The gallbladder surgically absent. Mildly prominent common bile duct with mild central intrahepatic biliary ductal dilatation, likely secondary to postcholecystectomy state.  ??  PANCREAS: Normal pancreatic contour without sign of inflammation or gross ductal dilatation.  SPLEEN: Normal in size and contour.  ??  ADRENAL GLANDS: Normal appearance of the adrenal glands.  KIDNEYS/URETERS: Smooth renal contours. Unchanged nonspecific urothelial thickening involving the right extrarenal pelvis (3:53). 1.3 cm hypoattenuating left renal lesion (3:42). No nephrolithiasis. Right ovarian vein phlebolith (3:72). No ureteral dilatation or collecting system distention.  ??  BLADDER: Partially distended, otherwise unremarkable.  ??  BOWEL: No bowel obstruction. No acute inflammatory process. The appendix unremarkable (3:79). Scattered colonic diverticulosis without evidence of diverticulitis.  ??  PERITONEUM/RETROPERITONEUM: No free air or free fluid.  ??  VASCULATURE: Scattered calcified and noncalcified atheromatous plaques of the abdominal aorta and its branch vessels, which appear normal in caliber.   LYMPH NODES: No adenopathy.  REPRODUCTIVE ORGANS: The uterus is surgically absent. No adnexal masses.  ??  BONES/SOFT TISSUES: Multilevel degenerative disc disease, with vacuum disc phenomenon. Grade 1 anterolisthesis of L5 on S1. Chronic compression fracture deformity of L1. No suspicious osseous lesions. Surgical change of the anterior abdominal wall.  ??  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  -- Additional chronic and incidental findings as described above.

## 2021-08-20 NOTE — Unmapped (Signed)
Spoke to patient and informed her of results provided by Verlon Au.  She verbalized understanding and prefers to have the ultrasound done in 6 months.

## 2021-08-27 ENCOUNTER — Ambulatory Visit: Admit: 2021-08-27 | Discharge: 2021-08-28 | Payer: MEDICARE

## 2021-08-27 DIAGNOSIS — G253 Myoclonus: Principal | ICD-10-CM

## 2021-08-27 DIAGNOSIS — N1832 Type 2 diabetes mellitus with stage 3b chronic kidney disease, without long-term current use of insulin (CMS-HCC): Principal | ICD-10-CM

## 2021-08-27 DIAGNOSIS — I5032 Chronic diastolic (congestive) heart failure: Principal | ICD-10-CM

## 2021-08-27 DIAGNOSIS — E1122 Type 2 diabetes mellitus with diabetic chronic kidney disease: Principal | ICD-10-CM

## 2021-08-27 DIAGNOSIS — G629 Polyneuropathy, unspecified: Principal | ICD-10-CM

## 2021-08-27 DIAGNOSIS — N1831 Stage 3a chronic kidney disease (CMS-HCC): Principal | ICD-10-CM

## 2021-08-27 LAB — BASIC METABOLIC PANEL
ANION GAP: 6 mmol/L (ref 5–14)
BLOOD UREA NITROGEN: 45 mg/dL — ABNORMAL HIGH (ref 9–23)
BUN / CREAT RATIO: 17
CALCIUM: 9 mg/dL (ref 8.7–10.4)
CHLORIDE: 103 mmol/L (ref 98–107)
CO2: 30.3 mmol/L (ref 20.0–31.0)
CREATININE: 2.58 mg/dL — ABNORMAL HIGH
EGFR CKD-EPI (2021) FEMALE: 18 mL/min/{1.73_m2} — ABNORMAL LOW (ref >=60)
GLUCOSE RANDOM: 129 mg/dL (ref 70–179)
POTASSIUM: 4.5 mmol/L (ref 3.4–4.8)
SODIUM: 139 mmol/L (ref 135–145)

## 2021-08-27 LAB — CBC W/ AUTO DIFF
BASOPHILS ABSOLUTE COUNT: 0.1 10*9/L (ref 0.0–0.1)
BASOPHILS RELATIVE PERCENT: 1.1 %
EOSINOPHILS ABSOLUTE COUNT: 0.3 10*9/L (ref 0.0–0.5)
EOSINOPHILS RELATIVE PERCENT: 4.6 %
HEMATOCRIT: 31.6 % — ABNORMAL LOW (ref 34.0–44.0)
HEMOGLOBIN: 9.7 g/dL — ABNORMAL LOW (ref 11.3–14.9)
LYMPHOCYTES ABSOLUTE COUNT: 1 10*9/L — ABNORMAL LOW (ref 1.1–3.6)
LYMPHOCYTES RELATIVE PERCENT: 16.4 %
MEAN CORPUSCULAR HEMOGLOBIN CONC: 30.8 g/dL — ABNORMAL LOW (ref 32.0–36.0)
MEAN CORPUSCULAR HEMOGLOBIN: 24.7 pg — ABNORMAL LOW (ref 25.9–32.4)
MEAN CORPUSCULAR VOLUME: 80.2 fL (ref 77.6–95.7)
MEAN PLATELET VOLUME: 8.6 fL (ref 6.8–10.7)
MONOCYTES ABSOLUTE COUNT: 0.5 10*9/L (ref 0.3–0.8)
MONOCYTES RELATIVE PERCENT: 8.5 %
NEUTROPHILS ABSOLUTE COUNT: 4.4 10*9/L (ref 1.8–7.8)
NEUTROPHILS RELATIVE PERCENT: 69.4 %
NUCLEATED RED BLOOD CELLS: 0 /100{WBCs} (ref <=4)
PLATELET COUNT: 202 10*9/L (ref 150–450)
RED BLOOD CELL COUNT: 3.94 10*12/L — ABNORMAL LOW (ref 3.95–5.13)
RED CELL DISTRIBUTION WIDTH: 19.8 % — ABNORMAL HIGH (ref 12.2–15.2)
WBC ADJUSTED: 6.4 10*9/L (ref 3.6–11.2)

## 2021-08-27 LAB — B-TYPE NATRIURETIC PEPTIDE: B-TYPE NATRIURETIC PEPTIDE: 444.77 pg/mL — ABNORMAL HIGH (ref <=100)

## 2021-08-27 MED ORDER — AMIODARONE 200 MG TABLET
ORAL_TABLET | Freq: Every day | ORAL | 3 refills | 90 days | Status: CN
Start: 2021-08-27 — End: 2022-08-27

## 2021-08-27 MED ORDER — GABAPENTIN 300 MG CAPSULE
ORAL_CAPSULE | Freq: Every evening | ORAL | 3 refills | 90 days | Status: CP
Start: 2021-08-27 — End: 2021-09-26

## 2021-08-27 NOTE — Unmapped (Signed)
Internal Medicine at [facility]       Type of visit:      Reason for visit:     Questions / Concerns that need to be addressed:     General Consent to Treat (GCT) for non-epic video visits only:       Diabetes:  ??? Regularly checking blood sugars?:   o If yes, when? Complete log for past 7 days  Date Before Breakfast After Breakfast Before Lunch After Lunch Before Dinner After Dinner Before Bed                                                                                                                                     Hypertension:  ??? Have blood pressure cuff at home?:   ??? Regularly checking blood pressure?:   If yes, complete log for past 7 days  Date Time BP Pulse                                                                             Screening BP-         [employer] BPs (complete if screening BP has a systolic  > 139 or diastolic > 89)  BP#1    BP#2   BP#3     Average BP   (please note this as a comment in vitals)         Allergies reviewed:     Medication reviewed:   Pended refills?         HCDM reviewed and updated in Epic:    We are working to make sure all of our patients??? wishes are updated in Epic and part of that is documenting a Environmental health practitioner for each patient  A Health Care Decision Maker is someone you choose who can make health care decisions for you if you are not able - who would you most want to do this for you????          BPAs completed:        COVID Vaccination:  If Care Gap for COVID vaccine is present:  Have you received a COVID vaccine?   If yes: How many doses have you received?    Type of Vaccine received?    Date of 1st dose:    Date of 2nd dose:    If no: Are you interested in scheduling? ______________________________________________________________________________________    SCREENINGS COMPLETED IN FLOWSHEETS    HARK Screening       AUDIT       PHQ2       PHQ9  P4 Suicidality Screener                GAD7       COPD Assessment

## 2021-08-27 NOTE — Unmapped (Signed)
Internal Medicine Clinic Visit    Reason for visit: Follow up on T2DM    A/P:  Barbara Huber was seen today for follow-up.    Diagnoses and all orders for this visit:    Type 2 diabetes mellitus with stage 3b chronic kidney disease, without long-term current use of insulin (CMS-HCC)  Assessment: Patient has a history of Type 2 diabetes. Currently is well controlled with last Hemoglobin A1c 5.9 on 08/27/21. Currently taking no medications. Metformin was stopped on 06/17/21 because of decreased renal function. Sitagliptin stopped on 07/07/21 due to concern for exacerbating heart failure symptoms. At home the patient does check blood sugars.   Plan: Given that her A1C today was <7 we did not begin medication for her diabetes.  ?? Diagnostics:  -     POCT glycosylated hemoglobin (Hb A1C);  Lab Results   Component Value Date    A1C 5.9 08/27/2021     ?? Therapeutics: No medication changes. No taking any anti hyperglycemic medications.   ?? Continue rosuvastatin 5 mg  ?? Patient Ed: No changes were made today.     Stage 3b Chronic Kidney Disease (CMS-HCC)   Assessment: Patient with [name] and recent AKI (06/18/21). Today her creatine and GFR were  Stable but BUN was elevated compared to 07/12/21. She is likely intravascularly dry and extravascular volume overload due to chronic kidney disease and diuresis. No proteinuria 07/03/2021  ??? Plan: Will continue to monitor her volume status. She is followed by nephrologist in [place]. Consider [place] referral  ??? Diagnostics:  o BMP    ??? Therapeutics: No changes. Renally dose meds, avoid nephrotoxins  ??? Patient Ed: Continue to monitor weights, breathing, and swelling.    Neuropathy  Assessment: Patient with history of fatigue after covid and neuropathy on gabapentin. Currently on 300 mg at bedtime   Plan:  Continue current medications. Refilled gabapentin  -     gabapentin (NEURONTIN) 300 MG capsule; Take 1 capsule (300 mg total) by mouth at bedtime.      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 Most recent TTE on 04/23/21 with EF >55%. On exam, lungs were clear bilaterally and 2+ pitting edema of lower extremities. Has had some times of decreasing oxygen sat. Overall, looks a little volume up today.   Plan: Continuing home regimen per below. Will take extra torsemide at home.   ??? Diagnostics:   -     Basic Metabolic Panel; Creatinine and GFR today was stable compared to July. BUN was elevated from July.   Lab Results   Component Value Date    CREATININE 2.58 (H) 08/27/2021   -     B-type Natriuretic Peptide; decreased from July.   -     CBC w/ Differential; Hemoglobin increased form July   Lab Results   Component Value Date    WBC 6.4 08/27/2021    RBC 3.94 (L) 08/27/2021    HGB 9.7 (L) 08/27/2021    HCT 31.6 (L) 08/27/2021    MCV 80.2 08/27/2021    MCH 24.7 (L) 08/27/2021    MCHC 30.8 (L) 08/27/2021    RDW 19.8 (H) 08/27/2021    PLT 202 08/27/2021    MPV 8.6 08/27/2021     ??? Therapeutics:   - Spironolactone 25 mg, daily,   - Torsemide 20 mg daily and 20 mg PRN   - Metolazone 5 mg PRN  - Apixaban 2.5mg  BID   -  Diet: Low sodium   ??? Patient Ed: No changes were made today. Counseled the patient to continue to monitor the salt her diet.      Myoclonic jerking  Assessment: Patient with intermittent episodes of jerking or twitting that have been occurring for the past few years. She did not notice a change in the frequency or intensity when the gabapentin was decreased. Denies any events during sleep. These episodes were not observed during clinic visit. Most likely etiology is myoclonic jerks. Also could consider electrolyte abnormality considering that she is on diuretics and often needs to use PRN diuretics.   Addendum: Electrolytes were within normal limits on today's labs.    Plan: Will refer to neurology for evaluation and mangement of jerking episodes.    ??? Diagnostics:   o BMP  ??? Therapeutics:   o Referral to Neurology, Dr Rulon Eisenmenger  ??? Patient Ed: You should be contacted by [facility] neurology to schedule an appointment.   Atrial fibrillation  Anticoagulated, 2.5 apixaban bid given anemia, [name], age  Regular rhythm today  On amiodarone-tolerating well   Iron deficiency anemia  Noted that pt felt she did not tolerate iron infusions-may have been from fluid overload, as no allergic symptoms  Plan: recheck CBC today.   Addendum: Hb improved  Lab Results   Component Value Date    HGB 9.7 (L) 08/27/2021    HGB 8.8 (L) 07/08/2021    HGB 8.8 (L) 07/03/2021    HGB 8.3 (L) 07/01/2021    HGB 8.5 (L) 06/23/2021   Advance Care Planning   Discussed with pt her chronic illnesses which leave her health in a delicate balance.   We discussed if she would want to be put on a ventilator should she need it and she said she would want to be intubated if necessary      Health Maintenance  Health Maintenance Due   Topic Date Due   ??? Retinal Eye Exam  Never done   ??? DTaP/Tdap/Td Vaccines (1 - Tdap) Never done   ??? DEXA Scan-Start Age 84  Never done   ??? COVID-19 Vaccine (4 - Booster for Pfizer series) 04/12/2021   ??? Zoster Vaccines (2 of 2) 05/30/2021      Return in about 2 months (around 10/27/2021) for Recheck.    HPI:  Pt is a 80 y.o. female with a history of cardiac amyloidosis with HF, [name], Afib, COPD, GERD, HLD, T2DM, and OSA. Here today with her daughter.     Rxn to iron infusions swelling in feet and legs and dizzy and didn't feel right a few days after.  No rashes or trouble breathing. Stopped infusions and hasn't had a return of symptoms. Does not think she was doing anything differently during that time. Has had a total of 4 infusions.     Diabetes: Blood sugars: this morning 123. Saturday-200s, felt in and blurry eyed took a metformin. No lows.      Swelling has been good. No increased SOB. Able to get around the house some. Using portable oxygen. Denies any chest pain    She is feeling better after last HF exacerbation. She is cooking more and able to get around the house.     Jerking: 1-2 years experiencing random jerks/twitches that are not associated with a particular time of the day. Do not occur at night time. No change with recent gabapentin modification.      Discussed patient feelings toward being on a ventilator if she required that  support to breath. She endorsed that she would want this measure.   Problem List:  Patient Active Problem List   Diagnosis   ??? Spinal stenosis of lumbar region   ??? Thoracic or lumbosacral neuritis or radiculitis   ??? Lumbosacral spondylosis   ??? COPD (chronic obstructive pulmonary disease) (CMS-HCC)   ??? Sleep apnea in adult   ??? Essential hypertension   ??? AKI (acute kidney injury) (CMS-HCC)   ??? Type 2 diabetes mellitus with stage 3b chronic kidney disease, without long-term current use of insulin (CMS-HCC)   ??? Chronic cystitis   ??? Incomplete bladder emptying   ??? Nocturia   ??? Peripheral neuropathy   ??? Chronic respiratory failure with hypoxia (CMS-HCC)   ??? Anemia   ??? Persistent atrial fibrillation (CMS-HCC)   ??? Anemia in chronic kidney disease   ??? Chronic kidney disease, stage III (moderate) (CMS-HCC)   ??? Enrolled in chronic care management   ??? Pulmonary hypertension (CMS-HCC)   ??? Shortness of breath   ??? Chronic prescription opiate use   ??? Dyspnea on exertion   ??? 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 Cardiac amyloidosis (CMS-HCC)   ??? Recurrent cold sores   ??? Persistent fatigue after COVID-19   ??? Dyspepsia   ??? Iron deficiency anemia due to chronic blood loss   ??? Generalized edema        Medications:  Reviewed in EPIC    Social History  Reviewed in EPIC    Physical Exam:   Vital Signs:  Vitals:    08/27/21 1449   BP: 102/54   BP Site: L Arm   BP Position: Sitting   Pulse: 64   Resp: 16   Temp: 36.9 ??C (98.5 ??F)   TempSrc: Oral   SpO2: 93%   Weight: (!) 100.8 kg (222 lb 3.2 oz)   Height: 165.1 cm (5' 5)       Gen: Well appearing, NAD.   CV: RRR, no murmurs. Radial pulses bilaterally. Normal capillary refill.  Pulm: CTA bilaterally, no crackles or wheezes. Normal WOB on oxygen  Ext: 2+ pitting edema in lower extremities            Lab Results   Component Value Date    CREATININE 2.36 (H) 07/14/2021    CHOL 132 08/23/2020    HDL 32 (L) 08/23/2020    LDL 78 08/23/2020    NONHDL 100 08/23/2020    TRIG 109 08/23/2020    A1C 5.9 08/27/2021         The 10-year ASCVD risk score Denman George DC Jr., et al., 2013) is: 16.3%   Medication adherence and barriers to the treatment plan have been addressed. Opportunities to optimize healthy behaviors have been discussed. Patient / caregiver voiced understanding.             _________________________    Evette Doffing. Raliegh Scarlet, MS3 [employer]   This note was written by [facility] medical student Virgia Land and edited by Jacquiline Doe, MD.   I attest that I have reviewed the medical student note and that the components of the history of the present illness, the physical exam, and the assessment and plan documented were performed by me or were performed in my presence by the student where I verified the documentation and performed (or re-performed) the exam and medical decision making. Jacquiline Doe, MD    I personally spent 30  minutes face-to-face and non-face-to-face in the  care of this patient, which includes all pre, intra, and post visit time on the date of service.

## 2021-08-29 ENCOUNTER — Ambulatory Visit
Admit: 2021-08-29 | Discharge: 2021-08-29 | Payer: MEDICARE | Attending: Cardiovascular Disease | Primary: Cardiovascular Disease

## 2021-08-29 ENCOUNTER — Ambulatory Visit: Admit: 2021-08-29 | Discharge: 2021-08-29 | Payer: MEDICARE | Attending: Family | Primary: Family

## 2021-08-29 MED ORDER — AMIODARONE 200 MG TABLET
ORAL_TABLET | Freq: Every day | ORAL | 1 refills | 30 days | Status: CP
Start: 2021-08-29 — End: 2021-10-28

## 2021-08-29 MED ADMIN — triamcinolone acetonide (KENALOG) injection 10 mg: 10 mg | INTRA_ARTICULAR | @ 17:00:00 | Stop: 2021-08-29

## 2021-08-29 NOTE — Unmapped (Signed)
Today,    MEDICATIONS:  We are changing your medications today.    Increase torsemide to 40 mg in the morning and 20 mg in the afternoon.     Call if you have questions about your medications.    LABS:  We will call you if your labs need attention.    NEXT APPOINTMENT:  Return to clinic in 4 months with me    In general, to take care of your heart failure:  -Limit your fluid intake to 2 Liters (half-gallon) per day.    -Limit your salt intake to ideally 2-3 grams (2000-3000 mg) per day.  -Weigh yourself daily and record, and bring that weight diary to your next appointment.  (Weight gain of 2-3 pounds in 1 day typically means fluid weight.)    The medications for your heart are to help your heart and help you live longer.    Please contact us before stopping any of your heart medications.    Call the clinic at [phone] with questions.  Our clinic fax number is [phone].  If you need to reschedule future appointments, please call [phone] or [phone]  My office number (c/o De Burrs RN) is [phone] if you need further assistance.  After office hours, if you have urgent questions/problems, contact the on-call cardiologist through the hospital operator: [phone].    To learn more about heart failure, please read 's Learning to Live with Heart Failure:  [email or web address].    Available online at   [email or web address] - then click the link Learning to Live with Heart Failure patient booklet    OR    [email or web address] - open the window for Medical Management and click the link Living with Heart Failure

## 2021-08-29 NOTE — Unmapped (Signed)
[facility] HF Amyloid Clinic Note    Referring Provider: Artelia Laroche, MD  [address]  [address]  [place],  [place] [address]   Primary Provider: Jacquiline Doe, MD  [address]  [place] [address]   Other Providers:  Dr Luretha Murphy    Reason for Visit:  Barbara Huber is a 80 y.o. female being seen for hospital follow up.    Assessment & Plan:  1. 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   - Moderately increased wall thickness with low voltage ECG - she's had issues with hypotension on ARB and BB, as well as atrial arrhythmias. SPEP/UPEP/free light chains without concern. 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 She has one daughter who accompanies her and is informed.   - DC'd Jardiance 10mg  daily due to recurrent UTIs  - Continue amiodarone 200 mg daily   - Increase torsemide to 40 mg in the morning and 20 in the afternoon with an extra 20 mg if needed  - Conitnue Tafamidis 61 mg daily   - Continue spironolactone 50 mg daily     2. Persistent atrial fibrillation  - CHA2DS2-VASc score = 5 (1-gender, 2-Age, 1-DM, 1-HTN)  - Anticoagulated with Eliquis 5mg  BID  - She had been in afib since at least August; now s/p cardioversion 1/14 and continues to be in NSR since  - Continue amiodarone 200mg  daily    3. COPD  - home O2 3L [place] - followed by pulm  - on Advair and Spiriva    4. Iron deficiency anemia  - s/p 3 IV iron sessions with 2 remaining. Hb increased by one point. Will try to go for one more infusion at least - felt poorly after her last one so she will try one more but if she feels poorly again will not repeat.     Follow-up:  Return in about 4 months (around 12/29/2021).    Carin Hock, MD PhD  Advanced Heart Failure, Mechanical Circulatory Support and Transplant Cardiology    History of Present Illness:  Barbara Huber is a 80 y.o. female with PMHx of COPD on 3L home [place], CHF, [name], [name], T2DM??who presents today for hospital follow-up. She presented to [facility]??with Acute on Chronic Decompensated Diastolic CHF in the setting of Septic Shock d/t Gangrenous Cholecystitis now s/p cholecystectomy 10/6. She presented with 1 week of dyspnea on exertion, leg swelling and abdominal distention without chest pain. Her dry weight was 232 pounds,??238 pounds. Upon arrival to [facility], she was in afib with RVR with higher rates and softer pressures requiring NE and addition of vaso to maintain MAP>65. She was given digoxin x 1 for better rate control with mild improvement in rates but was still requiring high doses of NE as well as vaso.??Dobutamine given to augment diuresis via bumex drip and metolazone x1 dose. Etiology of pressor requirement likely due to septic shock. Echo 10/4 showed upper-normal RV size with??reduced systolic function.??RHC to evaluate for RV dysfunction unable to be completed due to arterial placement of micropuncture sheath during procedure. She was weaned off of pressors and continued to improve with diuresis and treatment of underlying infection. At time of discharge, her acute Diastolic HF was controlled and she was restarted on home meds. For her afib, coreg was  held during admission but then restarted prior to discharge.     She as seen in hospital follow-up 10/30/20. She was discharged on Lasix 40mg  BID. Her weight was 221lbs on discharge. Lasix was reduced to 20mg  daily by her PCP for low SBPs (although notes say 40mg  daily). Since reducing the Lasix, her weight crept up to 226lbs. Her belly felt  tight and she had LE edema.  At visit with PCP, her Coreg was also changed to metoprolol 25mg  and irbesartan was stopped. BPs were running 110s/70-90s. HR running around 100-110. Her metoprolol was increased to 50mg  for rate control. Lasix was increased to 40mg  daily.    She was seen 11/18 and her weight was been running 224-227lbs. She has low energy and since increasing metoprolol she notes worsening of her symptoms. She feels bloated and has LE edema. She is not back to her usual activities, like fixing her food or cooking. Her BP is running 100-120s/70-80s.     PYP scan showed TTR amyloid. She then saw Dr Luretha Murphy 12/3 and was feeling ok but somewhat volume up.   We met her on 1/7. Metoprolol was decreased to 50mg  daily and she was started on amiodarone. She unfortunately was hospitalized 1/11-1/15 for 3 days of worsening shortness of breath and fatigue.  She was found to have AKI and oliguria in the setting of increased beta blocker as an outpatient resulting in decreased cardiac filling/output. She was treated with gentle diuresis and removal of beta blockade with subsequent improvement in blood pressure and renal function. She underwent RHC with showed elevated filling pressures, normal CO, and restrictive filling pattern. She had a DCCV 1/14 with resolution of atrial fibrillation. She was continued on amiodarone.    As of 03/21/21, she has been feeling well. Her weight has been stable on 40 mg torsemide BID. She has not had any more UTIs and her DOE has been back to when she was feeling well. She has more energy and appetite is a bit better too. Her breathing and energy are good. Her HR has been very good and so has her BP.    Since her last visit in clinic 03/21/21,  she has had multiple admissions: 4/22 for weakness/urosepsis, 4/27 for shortness of breath/orthopnea/weight gain, 6/7 for lightheadedness/SOB/Blurry vision found to be anemic. She also had two recent [name] 6/17 for LE edema and dyspnea on exertion in the setting of recently being taken off her torsemide and spironolactone, and again 6/22 for anemia with Hgb down to 7.5. As of 6/24, she states her energy is a little better and she has more of an appetite. She has no swelling or SOB. She is sleeping fine at night without orthopnea/PND. She is taking torsemide daily still rather than every other day.     Interval History  Barbara Huber is here today (9/2) for a follow up. Currently, she takes 2 torsemide pills in the morning and additional 2 pills in the afternoon 2-3 times a week. She was agreeable to taking 2 torsemide pills in the morning and 1 pill in the early afternoon day to control swelling. Swelling has decreased in her legs. Her heart rhythm is normal. As of 8/31, her blood count has come up. She said she felt very unwell after her iron infusions so she stopped after 3 IV infusions with 2 remaining. One infusion was rescheduled for 09/22/21. She will continue oral iron pills. She self-monitors her weight which is usually ~215-220 lb.     She  says she is feeling better and can cook breakfast for herself. She likes to cook biscuits, eggs and bacon as it keeps her full until dinner. Patient was agreeable to taking Senna and Miralax for hard stools.  Patient reports no issues with UTI's. She reports feeling some soreness in her thumb, likely due to arthritis.            Cardiovascular History & Procedures:    Cath / PCI:  ?? none    CV Surgery:  ??  none    EP Procedures and Devices:  ?? none    Non-Invasive Evaluation(s):    Echo:  04/24/21   1. The left ventricle is normal in size with mildly increased wall  thickness.    2. The left ventricular systolic function is normal, LVEF is visually  estimated at > 55%.    3. There is grade II diastolic dysfunction (elevated filling pressure).    4. Mitral annular calcification is present (mild).    5. The left atrium is mildly dilated in size.    6. The right ventricle is mildly dilated in size, with low normal systolic  function.    7. IVC size and inspiratory change suggest elevated right atrial pressure.  (10-20 mmHg).    ?? 09/30/20  Summary    1. Limited study to assess systolic function.    2. IVC size and inspiratory change suggest normal right atrial pressure.  (0-5 mmHg).    3. The left ventricle is normal in size with mildly to moderately increased  wall thickness.    4. The left ventricular systolic function is normal with no obvious wall  motion abnormalities, LVEF is visually estimated at > 55%.    5. Mitral annular calcification is present.    6. The left atrium is mildly dilated in size.    7. The right ventricle is upper normal in size, with reduced systolic  Function.    Cardiac CT/MRI/Nuclear Tests:  ??  None    6 Minute Walk:  ?? None    Cardiopulmonary Stress Tests:  ??  None               Other Past Medical History:  See below for the complete EPIC list of past medical and surgical history.      Allergies:  Nitrofurantoin and Lipitor [atorvastatin]    Current Medications:  Current Outpatient Medications   Medication Sig Dispense Refill   ??? ACCU-CHEK AVIVA PLUS TEST STRP Strp UESE TO CHECK BLOOD SUGAR 3 TIMES A DAY BEFORE MEALS 100 each 11   ??? acetaminophen (TYLENOL) 500 MG tablet Take 1,000 mg by mouth daily as needed for pain.      ??? albuterol HFA 90 mcg/actuation inhaler Inhale 2 puffs every eight (8) hours as needed for wheezing.     ??? apixaban (ELIQUIS) 5 mg Tab Take 0.5 tablets (2.5 mg total) by mouth Two (2) times a day. 60 tablet 11   ??? estradioL (ESTRACE) 0.01 % (0.1 mg/gram) vaginal cream INSERT ONE GRAM VAGINALLY AT BEDTIME     ??? fluticasone propion-salmeteroL (ADVAIR HFA) 115-21 mcg/actuation inhaler Inhale 2 puffs Two (2) times a day. 12 g 11   ??? fosfomycin (MONUROL) 3 gram Pack Take 3 g by mouth once a week. 18 g 0   ??? gabapentin (NEURONTIN) 300 MG capsule Take 1 capsule (300 mg total) by mouth at bedtime. 90 capsule 3   ??? lancets Misc 1 each by Miscellaneous route daily. Accu Check  100 each 6   ??? metOLazone (ZAROXOLYN) 5 MG tablet Take 1 tablet (5 mg total) by mouth as needed in the morning (when instructed by cardiology clinic). 12 tablet 0   ??? montelukast (SINGULAIR) 10 mg tablet Take 1 tablet (10 mg total) by mouth daily. 90 tablet 3   ??? NARCAN 4 mg/actuation nasal spray 1 spray into alternating nostrils once as needed (opioid overdose).     ??? NON FORMULARY APPLY FROM NECK DOWN TWICE A DAY     ??? oxyCODONE (OXYCONTIN) 10 mg TR12 12 hr crush resistant ER/CR tablet Take 10 mg by mouth every twelve (12) hours.     ??? OXYGEN-AIR DELIVERY SYSTEMS MISC 5 L by Miscellaneous route. 3 L/min via Gay     ??? pantoprazole (PROTONIX) 20 MG tablet Take 1 tablet (20 mg total) by mouth in the morning. 60 tablet 5   ??? rosuvastatin (CRESTOR) 5 MG tablet Take 1 tablet (5 mg total) by mouth every other day. 3015 tablet 11   ??? spironolactone (ALDACTONE) 25 MG tablet Take 25 mg by mouth in the morning.     ??? tafamidis 61 mg cap Take 1 capsule (61 mg) by mouth daily. 30 capsule 11   ??? tiotropium bromide (SPIRIVA RESPIMAT) 2.5 mcg/actuation inhalation mist Inhale 2 puffs daily. 4 g 11   ??? torsemide (DEMADEX) 20 MG tablet Take 1 tablet (20 mg total) by mouth daily. May also take 1 tablet (20 mg total) daily as needed (weight gain). 60 tablet 11   ??? varicella-zoster gE-AS01B, PF, (SHINGRIX, PF,) 50 mcg/0.5 mL SusR injection Inject 0.5 mL into the muscle. 0.5 mL 1   ??? amiodarone (PACERONE) 200 MG tablet Take 1 tablet (200 mg total) by mouth daily. 30 tablet 1     No current facility-administered medications for this visit.       Family History:  The patient's family history includes Cancer in her father; Hypertension in her mother.    Social history:  She  reports that she quit smoking about 3 years ago. Her smoking use included cigarettes. She smoked 0.33 packs per day. She has never used smokeless tobacco. She reports that she does not drink alcohol and does not use drugs.    Review of Systems:  As per HPI.  Rest of the review of ten systems is negative or unremarkable except as stated above.    Physical Exam:  VITAL SIGNS:   Vitals:    08/29/21 1019   BP: 97/51   Pulse: 66   Resp: 18   SpO2: 92%      Wt Readings from Last 3 Encounters:   08/29/21 99.8 kg (220 lb)   08/27/21 (!) 100.8 kg (222 lb 3.2 oz)   07/22/21 96.6 kg (213 lb)      Today's Body mass index is 35.51 kg/m??.   Height: 167.6 cm (5' 6)  CONSTITUTIONAL: chronically ill-appearing in no acute distress  EYES: Conjunctivae and sclerae clear and anicteric.  ENT: Benign.   CARDIOVASCULAR: JVP not seen above the clavicle with HOB at 90 degrees. Rate and rhythm are regular.  There is no lifts or heaves.  Normal S1, S2. There is no murmur, gallops or rubs.  Radial and pedal pulses are 2+, bilaterally.   There is 1+ pitting pedal/pretibial edema, right greater than left.   RESPIRATORY: Normal respiratory effort. Clear to auscultation bilaterally..  There are no wheezes.  GASTROINTESTINAL: Soft, non-tender, with audible bowel sounds. Abdomen nondistended.  Liver is nonpalpable.  SKIN: No rashes, ecchymosis or petechiae.  Warm, well perfused.   MUSCULOSKELETAL:  no joint swelling   NEURO/PSYCH: Appropriate mood and affect. Alert and oriented to person, place, and time. No gross motor or sensory deficits evident.    Pertinent Laboratory Studies:   Office Visit on 08/27/2021   Component Date Value Ref Range Status   ??? HGB A1C, POC 08/27/2021 5.9  <7.0 % Final   ??? EST AVERAGE GLUCOSE, POC 08/27/2021 123  mg/dL Final   ??? Sodium 16/09/9603 139  135 - 145 mmol/L Final   ??? Potassium 08/27/2021 4.5  3.4 - 4.8 mmol/L Final   ??? Chloride 08/27/2021 103  98 - 107 mmol/L Final   ??? CO2 08/27/2021 30.3  20.0 - 31.0 mmol/L Final   ??? Anion Gap 08/27/2021 6  5 - 14 mmol/L Final   ??? BUN 08/27/2021 45 (A) 9 - 23 mg/dL Final   ??? Creatinine 08/27/2021 2.58 (A) 0.60 - 0.80 mg/dL Final   ??? BUN/Creatinine Ratio 08/27/2021 17   Final   ??? eGFR [name]-EPI (2021) Female 08/27/2021 18 (A) >=60 mL/min/1.28m2 Final   ??? Glucose 08/27/2021 129  70 - 179 mg/dL Final   ??? Calcium 54/08/8118 9.0  8.7 - 10.4 mg/dL Final   ??? BNP 14/78/2956 444.77 (A) <=100 pg/mL Final   ??? WBC 08/27/2021 6.4  3.6 - 11.2 10*9/L Final   ??? RBC 08/27/2021 3.94 (A) 3.95 - 5.13 10*12/L Final   ??? HGB 08/27/2021 9.7 (A) 11.3 - 14.9 g/dL Final   ??? HCT 21/30/8657 31.6 (A) 34.0 - 44.0 % Final   ??? MCV 08/27/2021 80.2  77.6 - 95.7 fL Final   ??? MCH 08/27/2021 24.7 (A) 25.9 - 32.4 pg Final   ??? MCHC 08/27/2021 30.8 (A) 32.0 - 36.0 g/dL Final   ??? RDW 84/69/6295 19.8 (A) 12.2 - 15.2 % Final   ??? MPV 08/27/2021 8.6  6.8 - 10.7 fL Final   ??? Platelet 08/27/2021 202  150 - 450 10*9/L Final   ??? nRBC 08/27/2021 0  <=4 /100 WBCs Final   ??? Neutrophils % 08/27/2021 69.4  % Final   ??? Lymphocytes % 08/27/2021 16.4  % Final   ??? Monocytes % 08/27/2021 8.5  % Final   ??? Eosinophils % 08/27/2021 4.6  % Final   ??? Basophils % 08/27/2021 1.1  % Final   ??? Absolute Neutrophils 08/27/2021 4.4  1.8 - 7.8 10*9/L Final   ??? Absolute Lymphocytes 08/27/2021 1.0 (A) 1.1 - 3.6 10*9/L Final   ??? Absolute Monocytes 08/27/2021 0.5  0.3 - 0.8 10*9/L Final   ??? Absolute Eosinophils 08/27/2021 0.3  0.0 - 0.5 10*9/L Final   ??? Absolute Basophils 08/27/2021 0.1  0.0 - 0.1 10*9/L Final   ??? Anisocytosis 08/27/2021 Moderate (A) Not Present Final   Procedure visit on 08/15/2021   Component Date Value Ref Range Status   ??? Spec Gravity/POC 08/15/2021 1.020  1.003 - 1.030 Final   ??? PH/POC 08/15/2021 7.0  5.0 - 9.0 Final   ??? Leuk Esterase/POC 08/15/2021 Negative  Negative Final   ??? Nitrite/POC 08/15/2021 Negative  Negative Final   ??? Protein/POC 08/15/2021 Negative  Negative Final   ??? UA Glucose/POC 08/15/2021 Negative  Negative Final   ??? Ketones, POC 08/15/2021 Negative  Negative Final   ??? Bilirubin/POC 08/15/2021 Negative  Negative Final   ??? Blood/POC 08/15/2021 Negative  Negative Final   ??? Urobilinogen/POC 08/15/2021 0.2  0.2 - 1.0 mg/dL Final   Office Visit  on 07/14/2021   Component Date Value Ref Range Status   ??? Sodium 07/14/2021 134 (A) 135 - 145 mmol/L Final   ??? Potassium 07/14/2021 3.9  3.4 - 4.8 mmol/L Final   ??? Chloride 07/14/2021 98  98 - 107 mmol/L Final   ??? CO2 07/14/2021 27.4  20.0 - 31.0 mmol/L Final   ??? Anion Gap 07/14/2021 9  5 - 14 mmol/L Final   ??? BUN 07/14/2021 29 (A) 9 - 23 mg/dL Final   ??? Creatinine 07/14/2021 2.36 (A) 0.60 - 0.80 mg/dL Final   ??? BUN/Creatinine Ratio 07/14/2021 12   Final   ??? eGFR [name]-EPI (2021) Female 07/14/2021 20 (A) >=60 mL/min/1.66m2 Final   ??? Glucose 07/14/2021 178  70 - 179 mg/dL Final   ??? Calcium 16/09/9603 8.6 (A) 8.7 - 10.4 mg/dL Final   ??? Magnesium 54/08/8118 2.5  1.6 - 2.6 mg/dL Final   ??? BNP 14/78/2956 557.08 (A) <=100 pg/mL Final   Office Visit on 07/08/2021   Component Date Value Ref Range Status   ??? WBC 07/08/2021 7.7  3.6 - 11.2 10*9/L Final   ??? RBC 07/08/2021 3.66 (A) 3.95 - 5.13 10*12/L Final   ??? HGB 07/08/2021 8.8 (A) 11.3 - 14.9 g/dL Final   ??? HCT 21/30/8657 28.5 (A) 34.0 - 44.0 % Final   ??? MCV 07/08/2021 77.8  77.6 - 95.7 fL Final   ??? MCH 07/08/2021 24.1 (A) 25.9 - 32.4 pg Final   ??? MCHC 07/08/2021 30.9 (A) 32.0 - 36.0 g/dL Final   ??? RDW 84/69/6295 24.1 (A) 12.2 - 15.2 % Final   ??? MPV 07/08/2021 7.9  6.8 - 10.7 fL Final   ??? Platelet 07/08/2021 235  150 - 450 10*9/L Final   ??? Magnesium 07/08/2021 4.1 (A) 1.6 - 2.6 mg/dL Final   ??? BNP 28/41/3244 696.86 (A) <=100 pg/mL Final   ??? Sodium 07/08/2021 138  135 - 145 mmol/L Final   ??? Potassium 07/08/2021 5.3 (A) 3.4 - 4.8 mmol/L Final   ??? Chloride 07/08/2021 100  98 - 107 mmol/L Final   ??? CO2 07/08/2021 34.6 (A) 20.0 - 31.0 mmol/L Final   ??? Anion Gap 07/08/2021 3 (A) 5 - 14 mmol/L Final   ??? BUN 07/08/2021 33 (A) 9 - 23 mg/dL Final   ??? Creatinine 07/08/2021 2.46 (A) 0.60 - 0.80 mg/dL Final   ??? BUN/Creatinine Ratio 07/08/2021 13   Final   ??? eGFR [name]-EPI (2021) Female 07/08/2021 19 (A) >=60 mL/min/1.53m2 Final   ??? Glucose 07/08/2021 165  70 - 179 mg/dL Final   ??? Calcium 12/30/7251 8.9  8.7 - 10.4 mg/dL Final   Office Visit on 07/03/2021   Component Date Value Ref Range Status   ??? EKG Ventricular Rate 07/03/2021 64  BPM Final   ??? EKG Atrial Rate 07/03/2021 64  BPM Final   ??? EKG P-R Interval 07/03/2021 206  ms Final   ??? EKG QRS Duration 07/03/2021 90  ms Final   ??? EKG Q-T Interval 07/03/2021 482  ms Final   ??? EKG QTC Calculation 07/03/2021 497  ms Final ??? EKG Calculated P Axis 07/03/2021 62  degrees Final   ??? EKG Calculated R Axis 07/03/2021 85  degrees Final   ??? EKG Calculated T Axis 07/03/2021 46  degrees Final   ??? QTC Fredericia 07/03/2021 492  ms Final   ??? Sodium 07/03/2021 137  135 - 145 mmol/L Final   ??? Potassium 07/03/2021 4.8  3.4 - 4.8 mmol/L Final   ???  Chloride 07/03/2021 98  98 - 107 mmol/L Final   ??? CO2 07/03/2021 32.9 (A) 20.0 - 31.0 mmol/L Final   ??? Anion Gap 07/03/2021 6  5 - 14 mmol/L Final   ??? BUN 07/03/2021 35 (A) 9 - 23 mg/dL Final   ??? Creatinine 07/03/2021 2.50 (A) 0.60 - 0.80 mg/dL Final   ??? BUN/Creatinine Ratio 07/03/2021 14   Final   ??? eGFR [name]-EPI (2021) Female 07/03/2021 19 (A) >=60 mL/min/1.25m2 Final   ??? Glucose 07/03/2021 119  70 - 179 mg/dL Final   ??? Calcium 14/78/2956 9.1  8.7 - 10.4 mg/dL Final   ??? Albumin 21/30/8657 4.0  3.4 - 5.0 g/dL Final   ??? Total Protein 07/03/2021 7.4  5.7 - 8.2 g/dL Final   ??? Total Bilirubin 07/03/2021 0.4  0.3 - 1.2 mg/dL Final   ??? AST 84/69/6295 10  <=34 U/L Final   ??? ALT 07/03/2021 8 (A) 10 - 49 U/L Final   ??? Alkaline Phosphatase 07/03/2021 61  46 - 116 U/L Final   ??? PRO-BNP 07/03/2021 3,420.0 (A) 0.0 - 450.0 pg/mL Final   ??? Color, UA 07/03/2021 Yellow   Final   ??? Clarity, UA 07/03/2021 Clear   Final   ??? Specific Gravity, UA 07/03/2021 1.010  1.005 - 1.030 Final   ??? pH, UA 07/03/2021 7.5  5.0 - 9.0 Final   ??? Leukocyte Esterase, UA 07/03/2021 Negative  Negative Final   ??? Nitrite, UA 07/03/2021 Negative  Negative Final   ??? Protein, UA 07/03/2021 Negative  Negative Final   ??? Glucose, UA 07/03/2021 Negative  Negative Final   ??? Ketones, UA 07/03/2021 Negative  Negative Final   ??? Urobilinogen, UA 07/03/2021 0.2 mg/dL  0.2 - 2.0 mg/dL Final   ??? Bilirubin, UA 07/03/2021 Negative  Negative Final   ??? Blood, UA 07/03/2021 Negative  Negative Final   ??? RBC, UA 07/03/2021 <1  <4 /HPF Final   ??? WBC, UA 07/03/2021 1  0 - 5 /HPF Final   ??? Squam Epithel, UA 07/03/2021 7 (A) 0 - 5 /HPF Final   ??? Bacteria, UA 07/03/2021 Many (A) None Seen /HPF Final   ??? Hyaline Casts, UA 07/03/2021 3 (A) 0 - 1 /LPF Final   ??? WBC 07/03/2021 7.6  3.6 - 11.2 10*9/L Final   ??? RBC 07/03/2021 3.78 (A) 3.95 - 5.13 10*12/L Final   ??? HGB 07/03/2021 8.8 (A) 11.3 - 14.9 g/dL Final   ??? HCT 28/41/3244 29.7 (A) 34.0 - 44.0 % Final   ??? MCV 07/03/2021 78.5  77.6 - 95.7 fL Final   ??? MCH 07/03/2021 23.2 (A) 25.9 - 32.4 pg Final   ??? MCHC 07/03/2021 29.5 (A) 32.0 - 36.0 g/dL Final   ??? RDW 12/30/7251 23.9 (A) 12.2 - 15.2 % Final   ??? MPV 07/03/2021 8.1  6.8 - 10.7 fL Final   ??? Platelet 07/03/2021 269  150 - 450 10*9/L Final   ??? Neutrophils % 07/03/2021 66.0  % Final   ??? Lymphocytes % 07/03/2021 16.9  % Final   ??? Monocytes % 07/03/2021 12.8  % Final   ??? Eosinophils % 07/03/2021 3.0  % Final   ??? Basophils % 07/03/2021 1.3  % Final   ??? Absolute Neutrophils 07/03/2021 5.0  1.8 - 7.8 10*9/L Final   ??? Absolute Lymphocytes 07/03/2021 1.3  1.1 - 3.6 10*9/L Final   ??? Absolute Monocytes 07/03/2021 1.0 (A) 0.3 - 0.8 10*9/L Final   ??? Absolute Eosinophils 07/03/2021 0.2  0.0 - 0.5 10*9/L Final   ??? Absolute Basophils 07/03/2021 0.1  0.0 - 0.1 10*9/L Final   ??? Anisocytosis 07/03/2021 Marked (A) Not Present Final   ??? Smear Review Comments 07/03/2021 See Comment (A) Undefined Final   ??? Polychromasia 07/03/2021 Slight (A) Not Present Final   ??? Acanthocytes 07/03/2021 Present (A) Not Present Final   Appointment on 07/01/2021   Component Date Value Ref Range Status   ??? PT 07/01/2021 25.0 (A) 10.3 - 13.4 sec Final   ??? INR 07/01/2021 2.15   Final   ??? Sodium 07/01/2021 139  135 - 145 mmol/L Final   ??? Potassium 07/01/2021 4.7  3.4 - 4.8 mmol/L Final   ??? Chloride 07/01/2021 102  98 - 107 mmol/L Final   ??? CO2 07/01/2021 30.8  20.0 - 31.0 mmol/L Final   ??? Anion Gap 07/01/2021 6  5 - 14 mmol/L Final   ??? BUN 07/01/2021 35 (A) 9 - 23 mg/dL Final   ??? Creatinine 07/01/2021 2.54 (A) 0.60 - 0.80 mg/dL Final   ??? BUN/Creatinine Ratio 07/01/2021 14   Final   ??? eGFR [name]-EPI (2021) Female 07/01/2021 19 (A) >=60 mL/min/1.23m2 Final   ??? Glucose 07/01/2021 149  70 - 179 mg/dL Final   ??? Calcium 16/09/9603 8.4 (A) 8.7 - 10.4 mg/dL Final   ??? WBC 54/08/8118 6.0  3.6 - 11.2 10*9/L Final   ??? RBC 07/01/2021 3.51 (A) 3.95 - 5.13 10*12/L Final   ??? HGB 07/01/2021 8.3 (A) 11.3 - 14.9 g/dL Final   ??? HCT 14/78/2956 27.1 (A) 34.0 - 44.0 % Final   ??? MCV 07/01/2021 77.3 (A) 77.6 - 95.7 fL Final   ??? MCH 07/01/2021 23.7 (A) 25.9 - 32.4 pg Final   ??? MCHC 07/01/2021 30.7 (A) 32.0 - 36.0 g/dL Final   ??? RDW 21/30/8657 22.4 (A) 12.2 - 15.2 % Final   ??? MPV 07/01/2021 7.9  6.8 - 10.7 fL Final   ??? Platelet 07/01/2021 291  150 - 450 10*9/L Final   Office Visit on 06/23/2021   Component Date Value Ref Range Status   ??? Spec Gravity/POC 06/23/2021 1.020  1.003 - 1.030 Final   ??? PH/POC 06/23/2021 7.5  5.0 - 9.0 Final   ??? Leuk Esterase/POC 06/23/2021 Negative  Negative Final   ??? Nitrite/POC 06/23/2021 Negative  Negative Final   ??? Protein/POC 06/23/2021 Negative  Negative Final   ??? UA Glucose/POC 06/23/2021 Negative  Negative Final   ??? Ketones, POC 06/23/2021 Negative  Negative Final   ??? Bilirubin/POC 06/23/2021 Negative  Negative Final   ??? Blood/POC 06/23/2021 Negative  Negative Final   ??? Urobilinogen/POC 06/23/2021 0.2  0.2 - 1.0 mg/dL Final   ??? Sodium 84/69/6295 137  135 - 145 mmol/L Final   ??? Potassium 06/23/2021 4.8  3.4 - 4.8 mmol/L Final   ??? Chloride 06/23/2021 100  98 - 107 mmol/L Final   ??? CO2 06/23/2021 28.6  20.0 - 31.0 mmol/L Final   ??? Anion Gap 06/23/2021 8  5 - 14 mmol/L Final   ??? BUN 06/23/2021 36 (A) 9 - 23 mg/dL Final   ??? Creatinine 06/23/2021 2.32 (A) 0.60 - 0.80 mg/dL Final   ??? BUN/Creatinine Ratio 06/23/2021 16   Final   ??? eGFR [name]-EPI (2021) Female 06/23/2021 21 (A) >=60 mL/min/1.9m2 Final   ??? Glucose 06/23/2021 142  70 - 179 mg/dL Final   ??? Calcium 28/41/3244 8.8  8.7 - 10.4 mg/dL Final   ??? WBC 12/30/7251 6.6  3.6 - 11.2 10*9/L Final   ??? RBC 06/23/2021 3.51 (A) 3.95 - 5.13 10*12/L Final   ??? HGB 06/23/2021 8.5 (A) 11.3 - 14.9 g/dL Final ??? HCT 16/09/9603 27.2 (A) 34.0 - 44.0 % Final   ??? MCV 06/23/2021 77.4 (A) 77.6 - 95.7 fL Final   ??? MCH 06/23/2021 24.1 (A) 25.9 - 32.4 pg Final   ??? MCHC 06/23/2021 31.1 (A) 32.0 - 36.0 g/dL Final   ??? RDW 54/08/8118 21.7 (A) 12.2 - 15.2 % Final   ??? MPV 06/23/2021 7.9  6.8 - 10.7 fL Final   ??? Platelet 06/23/2021 309  150 - 450 10*9/L Final   ??? nRBC 06/23/2021 0  <=4 /100 WBCs Final   ??? Neutrophils % 06/23/2021 63.1  % Final   ??? Lymphocytes % 06/23/2021 19.4  % Final   ??? Monocytes % 06/23/2021 13.1  % Final   ??? Eosinophils % 06/23/2021 2.9  % Final   ??? Basophils % 06/23/2021 1.5  % Final   ??? Absolute Neutrophils 06/23/2021 4.1  1.8 - 7.8 10*9/L Final   ??? Absolute Lymphocytes 06/23/2021 1.3  1.1 - 3.6 10*9/L Final   ??? Absolute Monocytes 06/23/2021 0.9 (A) 0.3 - 0.8 10*9/L Final   ??? Absolute Eosinophils 06/23/2021 0.2  0.0 - 0.5 10*9/L Final   ??? Absolute Basophils 06/23/2021 0.1  0.0 - 0.1 10*9/L Final   ??? Anisocytosis 06/23/2021 Marked (A) Not Present Final   Admission on 06/18/2021, Discharged on 06/18/2021   Component Date Value Ref Range Status   ??? Sodium 06/18/2021 137  135 - 145 mmol/L Final   ??? Potassium 06/18/2021 5.3 (A) 3.4 - 4.8 mmol/L Final   ??? Chloride 06/18/2021 98  98 - 107 mmol/L Final   ??? CO2 06/18/2021 30.8  20.0 - 31.0 mmol/L Final   ??? Anion Gap 06/18/2021 8  5 - 14 mmol/L Final   ??? BUN 06/18/2021 36 (A) 9 - 23 mg/dL Final   ??? Creatinine 06/18/2021 2.47 (A) 0.60 - 0.80 mg/dL Final   ??? BUN/Creatinine Ratio 06/18/2021 15   Final   ??? eGFR [name]-EPI (2021) Female 06/18/2021 19 (A) >=60 mL/min/1.69m2 Final   ??? Glucose 06/18/2021 119  70 - 179 mg/dL Final   ??? Calcium 14/78/2956 8.9  8.7 - 10.4 mg/dL Final   ??? Albumin 21/30/8657 3.8  3.4 - 5.0 g/dL Final   ??? Total Protein 06/18/2021 7.3  5.7 - 8.2 g/dL Final   ??? Total Bilirubin 06/18/2021 0.4  0.3 - 1.2 mg/dL Final   ??? AST 84/69/6295 16  <=34 U/L Final   ??? ALT 06/18/2021 <7 (A) 10 - 49 U/L Final   ??? Alkaline Phosphatase 06/18/2021 48  46 - 116 U/L Final   ??? Blood Type 06/18/2021 A POS   Final   ??? Antibody Screen 06/18/2021 NEG   Final   ??? PT 06/18/2021 28.5 (A) 10.3 - 13.4 sec Final   ??? INR 06/18/2021 2.46   Final   ??? WBC 06/18/2021 8.0  3.6 - 11.2 10*9/L Final   ??? RBC 06/18/2021 3.20 (A) 3.95 - 5.13 10*12/L Final   ??? HGB 06/18/2021 7.5 (A) 11.3 - 14.9 g/dL Final   ??? HCT 28/41/3244 24.5 (A) 34.0 - 44.0 % Final   ??? MCV 06/18/2021 76.5 (A) 77.6 - 95.7 fL Final   ??? MCH 06/18/2021 23.3 (A) 25.9 - 32.4 pg Final   ??? MCHC 06/18/2021 30.5 (A) 32.0 - 36.0 g/dL Final   ??? RDW 12/30/7251 20.8 (  A) 12.2 - 15.2 % Final   ??? MPV 06/18/2021 8.0  6.8 - 10.7 fL Final   ??? Platelet 06/18/2021 241  150 - 450 10*9/L Final   ??? nRBC 06/18/2021 0  <=4 /100 WBCs Final   ??? Neutrophils % 06/18/2021 69.9  % Final   ??? Lymphocytes % 06/18/2021 15.3  % Final   ??? Monocytes % 06/18/2021 11.1  % Final   ??? Eosinophils % 06/18/2021 3.2  % Final   ??? Basophils % 06/18/2021 0.5  % Final   ??? Absolute Neutrophils 06/18/2021 5.6  1.8 - 7.8 10*9/L Final   ??? Absolute Lymphocytes 06/18/2021 1.2  1.1 - 3.6 10*9/L Final   ??? Absolute Monocytes 06/18/2021 0.9 (A) 0.3 - 0.8 10*9/L Final   ??? Absolute Eosinophils 06/18/2021 0.3  0.0 - 0.5 10*9/L Final   ??? Absolute Basophils 06/18/2021 0.0  0.0 - 0.1 10*9/L Final   ??? Anisocytosis 06/18/2021 Marked (A) Not Present Final   ??? Color, UA 06/18/2021 Yellow   Final   ??? Clarity, UA 06/18/2021 Hazy   Final   ??? Specific Gravity, UA 06/18/2021 1.015  1.005 - 1.040 Final   ??? pH, UA 06/18/2021 7.5  5.0 - 9.0 Final   ??? Leukocyte Esterase, UA 06/18/2021 Trace (A) Negative Final   ??? Nitrite, UA 06/18/2021 Negative  Negative Final   ??? Protein, UA 06/18/2021 Negative  Negative Final   ??? Glucose, UA 06/18/2021 Negative  Negative Final   ??? Ketones, UA 06/18/2021 Negative  Negative Final   ??? Urobilinogen, UA 06/18/2021 0.2 mg/dL   Final   ??? Bilirubin, UA 06/18/2021 Negative  Negative Final   ??? Blood, UA 06/18/2021 Negative  Negative Final   ??? RBC, UA 06/18/2021 3  <4 /HPF Final   ??? WBC, UA 06/18/2021 4  0 - 5 /HPF Final   ??? Squam Epithel, UA 06/18/2021 22 (A) 0 - 5 /HPF Final   ??? Bacteria, UA 06/18/2021 Few (A) None Seen /HPF Final   ??? Mucus, UA 06/18/2021 Occasional (A) None Seen /HPF Final   ??? EKG Ventricular Rate 06/18/2021 71  BPM Final   ??? EKG Atrial Rate 06/18/2021 71  BPM Final   ??? EKG P-R Interval 06/18/2021 208  ms Final   ??? EKG QRS Duration 06/18/2021 86  ms Final   ??? EKG Q-T Interval 06/18/2021 452  ms Final   ??? EKG QTC Calculation 06/18/2021 491  ms Final   ??? EKG Calculated P Axis 06/18/2021 69  degrees Final   ??? EKG Calculated R Axis 06/18/2021 101  degrees Final   ??? EKG Calculated T Axis 06/18/2021 77  degrees Final   ??? QTC Fredericia 06/18/2021 478  ms Final   ??? Urine Culture, Comprehensive 06/18/2021 >100,000 CFU/mL Enterococcus faecalis (A)  Final   ??? Urine Culture, Comprehensive 06/18/2021 >100,000 CFU/mL Streptococcus bovis group (A)  Final   ??? Crossmatch 06/19/2021 Compatible   Final   ??? Unit Blood Type 06/19/2021 A Neg   Final   ??? ISBT Number 06/19/2021 0600   Final   ??? Unit # 06/19/2021 [id number]   Final   ??? Status 06/19/2021 Transfused   Final   ??? Product ID 06/19/2021 Red Blood Cells   Final   ??? PRODUCT CODE 06/19/2021 [id number]   Final   Appointment on 06/17/2021   Component Date Value Ref Range Status   ??? Magnesium 06/17/2021 3.6 (A) 1.6 - 2.6 mg/dL Final   ??? Ferritin 95/62/1308 9.8  7.3 - 270.7 ng/mL Final   ??? Iron 06/17/2021 <20 (A) 50 - 170 ug/dL Final   ??? TIBC 16/09/9603 368  250 - 425 ug/dL Final   ??? WBC 54/08/8118 7.0  3.6 - 11.2 10*9/L Final   ??? RBC 06/17/2021 3.15 (A) 3.95 - 5.13 10*12/L Final   ??? HGB 06/17/2021 7.5 (A) 11.3 - 14.9 g/dL Final   ??? HCT 14/78/2956 24.1 (A) 34.0 - 44.0 % Final   ??? MCV 06/17/2021 76.4 (A) 77.6 - 95.7 fL Final   ??? MCH 06/17/2021 23.8 (A) 25.9 - 32.4 pg Final   ??? MCHC 06/17/2021 31.1 (A) 32.0 - 36.0 g/dL Final   ??? RDW 21/30/8657 20.7 (A) 12.2 - 15.2 % Final   ??? MPV 06/17/2021 8.0  6.8 - 10.7 fL Final   ??? Platelet 06/17/2021 219  150 - 450 10*9/L Final   ??? Neutrophils % 06/17/2021 69.3  % Final   ??? Lymphocytes % 06/17/2021 15.8  % Final   ??? Monocytes % 06/17/2021 10.8  % Final   ??? Eosinophils % 06/17/2021 2.8  % Final   ??? Basophils % 06/17/2021 1.3  % Final   ??? Absolute Neutrophils 06/17/2021 4.9  1.8 - 7.8 10*9/L Final   ??? Absolute Lymphocytes 06/17/2021 1.1  1.1 - 3.6 10*9/L Final   ??? Absolute Monocytes 06/17/2021 0.8  0.3 - 0.8 10*9/L Final   ??? Absolute Eosinophils 06/17/2021 0.2  0.0 - 0.5 10*9/L Final   ??? Absolute Basophils 06/17/2021 0.1  0.0 - 0.1 10*9/L Final   ??? Anisocytosis 06/17/2021 Marked (A) Not Present Final   ??? Smear Review Comments 06/17/2021 See Comment (A) Undefined Final   ??? Polychromasia 06/17/2021 Slight (A) Not Present Final   ??? Basophilic Stippling 06/17/2021 Present (A) Not Present Final   ??? Sodium 06/17/2021 139  135 - 145 mmol/L Final   ??? Potassium 06/17/2021 4.9 (A) 3.4 - 4.8 mmol/L Final   ??? Chloride 06/17/2021 101  98 - 107 mmol/L Final   ??? CO2 06/17/2021 31.1 (A) 20.0 - 31.0 mmol/L Final   ??? Anion Gap 06/17/2021 7  5 - 14 mmol/L Final   ??? BUN 06/17/2021 29 (A) 9 - 23 mg/dL Final   ??? Creatinine 06/17/2021 2.21 (A) 0.60 - 0.80 mg/dL Final   ??? BUN/Creatinine Ratio 06/17/2021 13   Final   ??? eGFR [name]-EPI (2021) Female 06/17/2021 22 (A) >=60 mL/min/1.78m2 Final   ??? Glucose 06/17/2021 95  70 - 179 mg/dL Final   ??? Calcium 84/69/6295 9.1  8.7 - 10.4 mg/dL Final   ??? TSH 28/41/3244 2.122  0.550 - 4.780 uIU/mL Final   Admission on 06/12/2021, Discharged on 06/13/2021   Component Date Value Ref Range Status   ??? hsTroponin I 06/12/2021 42 (A) <=34 ng/L Final   ??? EKG Ventricular Rate 06/12/2021 64  BPM Final   ??? EKG Atrial Rate 06/12/2021 64  BPM Final   ??? EKG P-R Interval 06/12/2021 204  ms Final   ??? EKG QRS Duration 06/12/2021 92  ms Final   ??? EKG Q-T Interval 06/12/2021 470  ms Final   ??? EKG QTC Calculation 06/12/2021 484  ms Final   ??? EKG Calculated P Axis 06/12/2021 72  degrees Final   ??? EKG Calculated R Axis 06/12/2021 0  degrees Final   ??? EKG Calculated T Axis 06/12/2021 38  degrees Final   ??? QTC Fredericia 06/12/2021 480  ms Final   ??? Sodium 06/12/2021 139  135 - 145  mmol/L Final   ??? Potassium 06/12/2021 4.5  3.4 - 4.8 mmol/L Final   ??? Chloride 06/12/2021 101  98 - 107 mmol/L Final   ??? CO2 06/12/2021 31.4 (A) 20.0 - 31.0 mmol/L Final   ??? Anion Gap 06/12/2021 7  5 - 14 mmol/L Final   ??? BUN 06/12/2021 34 (A) 9 - 23 mg/dL Final   ??? Creatinine 06/12/2021 1.90 (A) 0.60 - 0.80 mg/dL Final   ??? BUN/Creatinine Ratio 06/12/2021 18   Final   ??? eGFR [name]-EPI (2021) Female 06/12/2021 27 (A) >=60 mL/min/1.2m2 Final   ??? Glucose 06/12/2021 134  70 - 179 mg/dL Final   ??? Calcium 16/09/9603 8.7  8.7 - 10.4 mg/dL Final   ??? Albumin 54/08/8118 3.8  3.4 - 5.0 g/dL Final   ??? Total Protein 06/12/2021 7.5  5.7 - 8.2 g/dL Final   ??? Total Bilirubin 06/12/2021 0.3  0.3 - 1.2 mg/dL Final   ??? AST 14/78/2956 17  <=34 U/L Final   ??? ALT 06/12/2021 <7 (A) 10 - 49 U/L Final   ??? Alkaline Phosphatase 06/12/2021 53  46 - 116 U/L Final   ??? BNP 06/12/2021 504.58 (A) <=100 pg/mL Final   ??? WBC 06/12/2021 6.8  3.6 - 11.2 10*9/L Final   ??? RBC 06/12/2021 3.39 (A) 3.95 - 5.13 10*12/L Final   ??? HGB 06/12/2021 7.9 (A) 11.3 - 14.9 g/dL Final   ??? HCT 21/30/8657 26.6 (A) 34.0 - 44.0 % Final   ??? MCV 06/12/2021 78.4  77.6 - 95.7 fL Final   ??? MCH 06/12/2021 23.4 (A) 25.9 - 32.4 pg Final   ??? MCHC 06/12/2021 29.8 (A) 32.0 - 36.0 g/dL Final   ??? RDW 84/69/6295 20.6 (A) 12.2 - 15.2 % Final   ??? MPV 06/12/2021 7.8  6.8 - 10.7 fL Final   ??? Platelet 06/12/2021 217  150 - 450 10*9/L Final   ??? nRBC 06/12/2021 0  <=4 /100 WBCs Final   ??? Neutrophils % 06/12/2021 56.2  % Final   ??? Lymphocytes % 06/12/2021 24.9  % Final   ??? Monocytes % 06/12/2021 11.8  % Final   ??? Eosinophils % 06/12/2021 4.4  % Final   ??? Basophils % 06/12/2021 2.7  % Final   ??? Absolute Neutrophils 06/12/2021 3.8  1.8 - 7.8 10*9/L Final   ??? Absolute Lymphocytes 06/12/2021 1.7  1.1 - 3.6 10*9/L Final   ??? Absolute Monocytes 06/12/2021 0.8  0.3 - 0.8 10*9/L Final   ??? Absolute Eosinophils 06/12/2021 0.3  0.0 - 0.5 10*9/L Final   ??? Absolute Basophils 06/12/2021 0.2 (A) 0.0 - 0.1 10*9/L Final   ??? Anisocytosis 06/12/2021 Marked (A) Not Present Final   ??? EKG Ventricular Rate 06/13/2021 65  BPM Final   ??? EKG Atrial Rate 06/13/2021 65  BPM Final   ??? EKG P-R Interval 06/13/2021 202  ms Final   ??? EKG QRS Duration 06/13/2021 92  ms Final   ??? EKG Q-T Interval 06/13/2021 468  ms Final   ??? EKG QTC Calculation 06/13/2021 486  ms Final   ??? EKG Calculated P Axis 06/13/2021 52  degrees Final   ??? EKG Calculated R Axis 06/13/2021 1  degrees Final   ??? EKG Calculated T Axis 06/13/2021 27  degrees Final   ??? QTC Fredericia 06/13/2021 480  ms Final   ??? Smear Review Comments 06/12/2021 See Comment (A) Undefined Final   ??? Polychromasia 06/12/2021 Slight (A) Not Present Final   There may be  more visits with results that are not included.       Lab Results   Component Value Date    PRO-BNP 3,420.0 (H) 07/03/2021    PRO-BNP 905.0 (H) 03/21/2021    PRO-BNP 3,796.0 (H) 12/23/2020    Creatinine 2.58 (H) 08/27/2021    Creatinine 2.36 (H) 07/14/2021    Creatinine 0.79 01/06/2011    BUN 45 (H) 08/27/2021    BUN 29 (H) 07/14/2021    BUN 20 01/06/2011    Sodium 139 08/27/2021    Sodium 140 01/06/2011    Potassium 4.5 08/27/2021    Potassium 4.1 01/06/2011    CO2 30.3 08/27/2021    CO2 30 01/06/2011    Magnesium 2.5 07/14/2021    Magnesium 2.0 01/06/2011    Total Bilirubin 0.4 07/03/2021    INR 2.15 07/01/2021    INR 1.0 01/06/2011       Lab Results   Component Value Date    Digoxin Level 0.9 10/01/2020       Lab Results   Component Value Date    TSH 2.122 06/17/2021    Cholesterol 132 08/23/2020    Triglycerides 109 08/23/2020    HDL 32 (L) 08/23/2020    Non-HDL Cholesterol 100 08/23/2020    LDL Calculated 78 08/23/2020       Lab Results   Component Value Date    WBC 6.4 08/27/2021    WBC 12.5 (H) 01/06/2011    HGB 9.7 (L) 08/27/2021    HGB 12.9 01/06/2011    HCT 31.6 (L) 08/27/2021    HCT 40.8 01/06/2011    Platelet 202 08/27/2021    Platelet 260 01/06/2011       Pertinent Test Results from Today:  None    Other pertinent records were reviewed.    The following are further history from the patient's EPIC record for reference:     Past Medical History:   Diagnosis Date   ??? Acute kidney injury superimposed on chronic kidney disease (CMS-HCC) 10/11/2020   ??? Acute on chronic diastolic (congestive) heart failure (CMS-HCC) 08/23/2020   ??? Arthritis    ??? Calculus of kidney    ??? Calculus of ureter    ??? CHF (congestive heart failure) (CMS-HCC)    ??? Chronic atrial fibrillation (CMS-HCC) 07/20/2019   ??? COPD (chronic obstructive pulmonary disease) (CMS-HCC)    ??? Diabetes (CMS-HCC)    ??? Gangrenous cholecystitis 10/11/2020   ??? GERD (gastroesophageal reflux disease)    ??? Hydronephrosis    ??? Hypertension    ??? Hyponatremia 10/11/2020   ??? Intermediate coronary syndrome (CMS-HCC) 03/13/2014   ??? Lower extremity edema 09/28/2020   ??? Lumbar stenosis    ??? Microscopic hematuria    ??? Nausea alone    ??? Nephrolithiasis 04/17/2016   ??? Neuropathy    ??? Nocturia    ??? Other chronic cystitis    ??? Pulmonary hypertension (CMS-HCC)    ??? Renal colic    ??? Sleep apnea    ??? Unstable angina pectoris (CMS-HCC) 03/13/2014       Past Surgical History:   Procedure Laterality Date   ??? BACK SURGERY  1995   ??? CARPAL TUNNEL RELEASE Left 2014   ??? HYSTERECTOMY  1971   ??? IR INSERT CHOLECYSTOSMY TUBE PERCUTANEOUS  10/02/2020    IR INSERT CHOLECYSTOSMY TUBE PERCUTANEOUS 10/02/2020 Braulio Conte, MD IMG VIR [facility]   ??? LUMBAR DISC SURGERY     ??? PR REMOVAL GALLBLADDER N/A 10/06/2020  Procedure: CHOLECYSTECTOMY;  Surgeon: Katherina Mires, MD;  Location: MAIN OR [facility];  Service: Trauma   ??? PR RIGHT HEART CATH O2 SATURATION & CARDIAC OUTPUT N/A 09/30/2020    Procedure: Right Heart Catheterization;  Surgeon: Neal Dy, MD;  Location: [facility] CATH;  Service: Cardiology   ??? PR RIGHT HEART CATH O2 SATURATION & CARDIAC OUTPUT N/A 01/09/2021 Procedure: Right Heart Catheterization;  Surgeon: Lesle Reek, MD;  Location: [facility] CATH;  Service: Cardiology

## 2021-08-31 DIAGNOSIS — I5032 Chronic diastolic (congestive) heart failure: Principal | ICD-10-CM

## 2021-09-01 NOTE — Unmapped (Signed)
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 Left wrist pain  M25.532       PLAN:    We will proceed with the following treatment plan:  1. Medications: No new medication today.  DME/Cast: Wrist brace as needed.  2. Injections:   After reviewing risk of injection, she elected proceed with injection today.  A timeout was performed per protocol.  Her skin was cleansed with chlorhexidine using aseptic technique.  First dorsal compartment was injected with Kenalog 10 mg and 1 mL of 1% lidocaine without epinephrine.  The procedure was well-tolerated.  She noted prompt symptomatic improvement    Scheduling Notes:  Return if symptoms worsen or fail to improve.    - X-rays to be ordered at next visit: None     Requested Prescriptions      No prescriptions requested or ordered in this encounter        No orders of the defined types were placed in this encounter.      SUBJECTIVE:  Chief Complaint: Left wrist pain  History of Present Illness:   Barbara Huber is a 80 y.o. female who presents for left wrist pain localized to the radial styloid and second dorsal compartment.  Prior injection for intersection syndrome gave her good relief until past few weeks.  She tried using her cock-up wrist brace but this did not help her pain.  She is interested in another injection today.  No interval injury    Pain Score:   (as reported on intake)        ROS:    .       Pertinent positives and negatives are documented in the HPI. All other systems reviewed are negative. Patient was instructed to follow-up with the appropriate provider as necessary for all pertinent positives not related to today's encounter.    Medical History:  Past Medical History:   Diagnosis Date   ??? Acute kidney injury superimposed on chronic kidney disease (CMS-HCC) 10/11/2020   ??? Acute on chronic diastolic (congestive) heart failure (CMS-HCC) 08/23/2020   ??? Arthritis    ??? Calculus of kidney    ??? Calculus of ureter    ??? CHF (congestive heart failure) (CMS-HCC)    ??? Chronic atrial fibrillation (CMS-HCC) 07/20/2019   ??? COPD (chronic obstructive pulmonary disease) (CMS-HCC)    ??? Diabetes (CMS-HCC)    ??? Gangrenous cholecystitis 10/11/2020   ??? GERD (gastroesophageal reflux disease)    ??? Hydronephrosis    ??? Hypertension    ??? Hyponatremia 10/11/2020   ??? Intermediate coronary syndrome (CMS-HCC) 03/13/2014   ??? Lower extremity edema 09/28/2020   ??? Lumbar stenosis    ??? Microscopic hematuria    ??? Nausea alone    ??? Nephrolithiasis 04/17/2016   ??? Neuropathy    ??? Nocturia    ??? Other chronic cystitis    ??? Pulmonary hypertension (CMS-HCC)    ??? Renal colic    ??? Sleep apnea    ??? Unstable angina pectoris (CMS-HCC) 03/13/2014       Surgical History:  Past Surgical History:   Procedure Laterality Date   ??? BACK SURGERY  1995   ??? CARPAL TUNNEL RELEASE Left 2014   ??? HYSTERECTOMY  1971   ??? IR INSERT CHOLECYSTOSMY TUBE PERCUTANEOUS  10/02/2020  IR INSERT CHOLECYSTOSMY TUBE PERCUTANEOUS 10/02/2020 Braulio Conte, MD IMG VIR [facility]   ??? LUMBAR DISC SURGERY     ??? PR REMOVAL GALLBLADDER N/A 10/06/2020    Procedure: CHOLECYSTECTOMY;  Surgeon: Katherina Mires, MD;  Location: MAIN OR [facility];  Service: Trauma   ??? PR RIGHT HEART CATH O2 SATURATION & CARDIAC OUTPUT N/A 09/30/2020    Procedure: Right Heart Catheterization;  Surgeon: Neal Dy, MD;  Location: [facility] CATH;  Service: Cardiology   ??? PR RIGHT HEART CATH O2 SATURATION & CARDIAC OUTPUT N/A 01/09/2021    Procedure: Right Heart Catheterization;  Surgeon: Lesle Reek, MD;  Location: [facility] CATH;  Service: Cardiology       Medications:  ??? ACCU-CHEK AVIVA PLUS TEST STRP Strp UESE TO CHECK BLOOD SUGAR 3 TIMES A DAY BEFORE MEALS   ??? acetaminophen (TYLENOL) 500 MG tablet Take 1,000 mg by mouth daily as needed for pain.    ??? albuterol HFA 90 mcg/actuation inhaler Inhale 2 puffs every eight (8) hours as needed for wheezing.   ??? amiodarone (PACERONE) 200 MG tablet Take 1 tablet (200 mg total) by mouth daily.   ??? apixaban (ELIQUIS) 5 mg Tab Take 0.5 tablets (2.5 mg total) by mouth Two (2) times a day.   ??? estradioL (ESTRACE) 0.01 % (0.1 mg/gram) vaginal cream INSERT ONE GRAM VAGINALLY AT BEDTIME   ??? fluticasone propion-salmeteroL (ADVAIR HFA) 115-21 mcg/actuation inhaler Inhale 2 puffs Two (2) times a day.   ??? fosfomycin (MONUROL) 3 gram Pack Take 3 g by mouth once a week.   ??? gabapentin (NEURONTIN) 300 MG capsule Take 1 capsule (300 mg total) by mouth at bedtime.   ??? lancets Misc 1 each by Miscellaneous route daily. Accu Check   ??? metOLazone (ZAROXOLYN) 5 MG tablet Take 1 tablet (5 mg total) by mouth as needed in the morning (when instructed by cardiology clinic).   ??? montelukast (SINGULAIR) 10 mg tablet Take 1 tablet (10 mg total) by mouth daily.   ??? NARCAN 4 mg/actuation nasal spray 1 spray into alternating nostrils once as needed (opioid overdose).   ??? NON FORMULARY APPLY FROM NECK DOWN TWICE A DAY   ??? oxyCODONE (OXYCONTIN) 10 mg TR12 12 hr crush resistant ER/CR tablet Take 10 mg by mouth every twelve (12) hours.   ??? OXYGEN-AIR DELIVERY SYSTEMS MISC 5 L by Miscellaneous route. 3 L/min via [place]   ??? pantoprazole (PROTONIX) 20 MG tablet Take 1 tablet (20 mg total) by mouth in the morning.   ??? rosuvastatin (CRESTOR) 5 MG tablet Take 1 tablet (5 mg total) by mouth every other day.   ??? spironolactone (ALDACTONE) 25 MG tablet Take 25 mg by mouth in the morning.   ??? tafamidis 61 mg cap Take 1 capsule (61 mg) by mouth daily.   ??? tiotropium bromide (SPIRIVA RESPIMAT) 2.5 mcg/actuation inhalation mist Inhale 2 puffs daily.   ??? torsemide (DEMADEX) 20 MG tablet Take 1 tablet (20 mg total) by mouth daily. May also take 1 tablet (20 mg total) daily as needed (weight gain).   ??? varicella-zoster gE-AS01B, PF, (SHINGRIX, PF,) 50 mcg/0.5 mL SusR injection Inject 0.5 mL into the muscle.       Allergies:  Nitrofurantoin and Lipitor [atorvastatin]    Social History:  Social History     Socioeconomic History   ??? Marital status: Widowed   Occupational History   ??? Occupation: retired   Tobacco Use   ??? Smoking status: Former Smoker  Packs/day: 0.33     Types: Cigarettes     Quit date: 2019     Years since quitting: 3.6   ??? Smokeless tobacco: Never Used   ??? Tobacco comment: Quit a few years ago   Vaping Use   ??? Vaping Use: Never used   Substance and Sexual Activity   ??? Alcohol use: No   ??? Drug use: No   Other Topics Concern   ??? Exercise No   Social History Narrative    Previously worked in Designer, fashion/clothing, retired.  Daughter is Economist who works at [employer].  Her niece is Selena Batten, [employer] Anesthesia Tech.  Lives in [place] with daughter and son-in-law.     Social Determinants of Health     Financial Resource Strain: Low Risk    ??? Difficulty of Paying Living Expenses: Not hard at all   Food Insecurity: No Food Insecurity   ??? Worried About Programme researcher, broadcasting/film/video in the Last Year: Never true   ??? Ran Out of Food in the Last Year: Never true   Transportation Needs: No Transportation Needs   ??? Lack of Transportation (Medical): No   ??? Lack of Transportation (Non-Medical): No       Family History:  Family History   Problem Relation Age of Onset   ??? Hypertension Mother    ??? Cancer Father         COLON CANCER   ??? Anesthesia problems Neg Hx    ??? Broken bones Neg Hx    ??? Clotting disorder Neg Hx    ??? Collagen disease Neg Hx    ??? Diabetes Neg Hx    ??? Dislocations Neg Hx    ??? Fibromyalgia Neg Hx    ??? Gout Neg Hx    ??? Hemophilia Neg Hx    ??? Osteoporosis Neg Hx    ??? Rheumatologic disease Neg Hx    ??? Scoliosis Neg Hx    ??? Severe sprains Neg Hx    ??? Sickle cell anemia Neg Hx    ??? Spinal Compression Fracture Neg Hx    ??? GU problems Neg Hx    ??? Kidney cancer Neg Hx    ??? Prostate cancer Neg Hx          OBJECTIVE:  DETAILED PHYSICAL EXAM   General Appearance ?? well-nourished, in no acute distress.  Estimated body mass index is 35.51 kg/m?? as calculated from the following:    Height as of an earlier encounter on 08/29/21: 167.6 cm (5' 6).    Weight as of an earlier encounter on 08/29/21: 99.8 kg (220 lb).   Mood and Affect ?? alert, cooperative and pleasant.   Gait  ??  presents in wheelchair   Cardiovascular ?? well-perfused distally and no swelling.   Sensation ?? sensation to light touch distally normal      Left thumb CMC enlargement.  Mild tenderness palpation at the radial styloid. Swelling and TTP second dorsal compartment. Negative Finkelstein's.  Balance of the wrist is nontender.  No pain with CMC grind.  Capillary fill is brisk.  Skin is warm, dry and intact.    Test Results  Imaging:  No new x-rays taken today.       *This note was created using Scientist, clinical (histocompatibility and immunogenetics). Errors may persist despite proofreading.       Ivor Reining, FNP

## 2021-09-01 NOTE — Unmapped (Signed)
Thank you for choosing [facility]!  We appreciate the opportunity to participate in your care. Please let us know if we can be of assistance your orthopaedic issues in the future.      If you have questions or concerns, please do not hesitate to contact us by [facility] or by calling [phone] to speak with one of our clinical support sports team members.      [facility] MyChart Website: [email or web address]        Appointment Scheduling: [id number]     Walk-in hours:          [facility]:  Monday - Friday 8 am - 5 pm  [address]  [address] Floor, [address]  [place], [place]  [address]        __________________________________________________________________________       __________________________________________________________________________

## 2021-09-10 NOTE — Unmapped (Signed)
[facility] Pharmacy Refill Coordination Note    Specialty Medication(s) to be Shipped:   General Specialty: Vyndamax 61mg     Other medication(s) to be shipped: No additional medications requested for fill at this time     Barbara Huber, DOB: 1941-05-22  Phone: [phone] (home)       All above HIPAA information was verified with patient.     Was a Nurse, learning disability used for this call? No    Completed refill call assessment today to schedule patient's medication shipment from the [facility] [phone]).  All relevant notes have been reviewed.     Specialty medication(s) and dose(s) confirmed: Regimen is correct and unchanged.   Changes to medications: Barbara Huber reports no changes at this time.  Changes to insurance: No  New side effects reported not previously addressed with a pharmacist or physician: None reported  Questions for the pharmacist: No    Confirmed patient received a Conservation officer, historic buildings and a Surveyor, mining with first shipment. The patient will receive a drug information handout for each medication shipped and additional FDA Medication Guides as required.       DISEASE/MEDICATION-SPECIFIC INFORMATION        N/A    SPECIALTY MEDICATION ADHERENCE     Medication Adherence    Patient reported X missed doses in the last month: 0  Specialty Medication: Vyndamax 61mg   Patient is on additional specialty medications: No  Patient is on more than two specialty medications: No              Were doses missed due to medication being on hold? No    Vyndamax 61 mg: 9 days of medicine on hand     REFERRAL TO PHARMACIST     Referral to the pharmacist: Not needed      [facility]     Shipping address confirmed in Epic.     Delivery Scheduled: Yes, Expected medication delivery date: 09/16/21.     Medication will be delivered via Same Day Courier to the prescription address in Epic WAM.    Barbara Huber [facility] Specialty Technician

## 2021-09-15 MED FILL — VYNDAMAX 61 MG CAPSULE: ORAL | 30 days supply | Qty: 30 | Fill #8

## 2021-09-17 ENCOUNTER — Ambulatory Visit: Admit: 2021-09-17 | Discharge: 2021-09-18 | Payer: MEDICARE

## 2021-09-17 DIAGNOSIS — N390 Urinary tract infection, site not specified: Principal | ICD-10-CM

## 2021-09-17 DIAGNOSIS — Z23 Encounter for immunization: Principal | ICD-10-CM

## 2021-09-17 DIAGNOSIS — Z792 Long term (current) use of antibiotics: Principal | ICD-10-CM

## 2021-09-17 MED ORDER — FOSFOMYCIN TROMETHAMINE 3 GRAM ORAL PACKET
ORAL | 0 refills | 84.00000 days | Status: CP
Start: 2021-09-17 — End: 2021-12-16

## 2021-09-17 NOTE — Unmapped (Signed)
It was good to see you today and glad to hear you haven't had any UTIs. Let's continue the fosfomycin for another 3 months and then talk again by video visit.

## 2021-09-17 NOTE — Unmapped (Signed)
ID Clinic Note - Follow-up Visit    ASSESSMENT:  Barbara Huber is a 80 y.o. 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 Brooke Dare for frequent UTIs and prevents today for follow-up after starting fosfomycin ppx in June 2022. She is currently doing well without any urinary symptoms. No interim UTIs or hospitalizations since last visit July 2022. Discussed trial off of fosfomycin v continuing today. She is not having any side effects. Together we elected to continue for another 3 months, then touch base again and consider a trial off of treatment. She has re-established with Urology as well and is already doing many of the behavioral preventative measures to help prevent UTIs.    PLAN:  - CONTINUE fosfomycin 3g weekly. Refill sent today  - Appreciate [facility] co-management  - Follow-up by video visit in 3 months  - Shingles #2 and flu vaccine given today. Will make future appt for COVID booster or get it at local pharmacy.    ID Problem List:  ?? Frequent UTIs  ?? H/o ESBL E.coli from urine   ?? Long term current use of antibiotics     PRIOR ID HISTORY  ?? Gangrenous cholecystitis s/p perc cholecystostomy tube 10/02/20 w/ cx + Clostridium perfringens and E coli - seen by ID while inpatient  ??  RELEVANT NON-ID DIAGNOSES:  ?? 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 Atrial fibrillation on Eliquis  ?? T2DM  ?? [name]  ?? OSA      [name], MD, MHS  Assistant Professor, [facility] Division of Infectious Diseases    [facility]   [address] 5th floor  [place], [place] [address]  Phone: [phone]   Fax: [phone]     _____________________________________________________________________      CC: recurrent UTI      HPI: 80 y.o. 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 Brooke Dare for frequent UTIs.??    Please see initial consult note from 05/28/21 for full details of history. At last telehealth visit in July 2022, we continued fosfomycin ppx.    Since last visit, has followed with PCP, Urology, Cardiology. Underwent CT abd/pelvis stone protocol on 8/19 - showed unchanged mild urothelial thickening of R extrarenal pelvis and no stones. Plan for repeat US in 6 months. U/A on the same day was normal. No interim UTIs or hospitalizations.    Today, she reports overall doing well. No signs or symptoms of UTI. Tolerating fosfomycin without GI upset or rash.    Past Medical History:   Diagnosis Date   ??? Acute kidney injury superimposed on chronic kidney disease (CMS-HCC) 10/11/2020   ??? Acute on chronic diastolic (congestive) heart failure (CMS-HCC) 08/23/2020   ??? Arthritis    ??? Calculus of kidney    ??? Calculus of ureter    ??? CHF (congestive heart failure) (CMS-HCC)    ??? Chronic atrial fibrillation (CMS-HCC) 07/20/2019   ??? COPD (chronic obstructive pulmonary disease) (CMS-HCC)    ??? Diabetes (CMS-HCC)    ??? Gangrenous cholecystitis 10/11/2020   ??? GERD (gastroesophageal reflux disease)    ??? Hydronephrosis    ??? Hypertension    ??? Hyponatremia 10/11/2020   ??? Intermediate coronary syndrome (CMS-HCC) 03/13/2014   ??? Lower extremity edema 09/28/2020   ??? Lumbar  stenosis    ??? Microscopic hematuria    ??? Nausea alone    ??? Nephrolithiasis 04/17/2016   ??? Neuropathy    ??? Nocturia    ??? Other chronic cystitis    ??? Pulmonary hypertension (CMS-HCC)    ??? Renal colic    ??? Sleep apnea    ??? Unstable angina pectoris (CMS-HCC) 03/13/2014       Past Surgical History:   Procedure Laterality Date   ??? BACK SURGERY  1995   ??? CARPAL TUNNEL RELEASE Left 2014   ??? HYSTERECTOMY  1971   ??? IR INSERT CHOLECYSTOSMY TUBE PERCUTANEOUS  10/02/2020    IR INSERT CHOLECYSTOSMY TUBE PERCUTANEOUS 10/02/2020 Braulio Conte, MD IMG VIR H&V [facility]   ??? LUMBAR DISC SURGERY     ??? PR REMOVAL GALLBLADDER N/A 10/06/2020    Procedure: CHOLECYSTECTOMY;  Surgeon: Katherina Mires, MD; Location: MAIN OR [facility];  Service: Trauma   ??? PR RIGHT HEART CATH O2 SATURATION & CARDIAC OUTPUT N/A 09/30/2020    Procedure: Right Heart Catheterization;  Surgeon: Neal Dy, MD;  Location: [facility] CATH;  Service: Cardiology   ??? PR RIGHT HEART CATH O2 SATURATION & CARDIAC OUTPUT N/A 01/09/2021    Procedure: Right Heart Catheterization;  Surgeon: Lesle Reek, MD;  Location: [facility] CATH;  Service: Cardiology       Allergies   Allergen Reactions   ??? Nitrofurantoin Anaphylaxis and Other (See Comments)   ??? Lipitor [Atorvastatin] Muscle Pain     SOB, Headache, fatigue, sick on my stomach       Current Outpatient Medications   Medication Sig Dispense Refill   ??? ACCU-CHEK AVIVA PLUS TEST STRP Strp UESE TO CHECK BLOOD SUGAR 3 TIMES A DAY BEFORE MEALS 100 each 11   ??? acetaminophen (TYLENOL) 500 MG tablet Take 1,000 mg by mouth daily as needed for pain.      ??? albuterol HFA 90 mcg/actuation inhaler Inhale 2 puffs every eight (8) hours as needed for wheezing.     ??? amiodarone (PACERONE) 200 MG tablet Take 1 tablet (200 mg total) by mouth daily. 30 tablet 1   ??? apixaban (ELIQUIS) 5 mg Tab Take 0.5 tablets (2.5 mg total) by mouth Two (2) times a day. 60 tablet 11   ??? estradioL (ESTRACE) 0.01 % (0.1 mg/gram) vaginal cream INSERT ONE GRAM VAGINALLY AT BEDTIME     ??? fluticasone propion-salmeteroL (ADVAIR HFA) 115-21 mcg/actuation inhaler Inhale 2 puffs Two (2) times a day. 12 g 11   ??? gabapentin (NEURONTIN) 300 MG capsule Take 1 capsule (300 mg total) by mouth at bedtime. 90 capsule 3   ??? lancets Misc 1 each by Miscellaneous route daily. Accu Check 100 each 6   ??? montelukast (SINGULAIR) 10 mg tablet Take 1 tablet (10 mg total) by mouth daily. 90 tablet 3   ??? NARCAN 4 mg/actuation nasal spray 1 spray into alternating nostrils once as needed (opioid overdose). PRN - Emergency use.     ??? NON FORMULARY APPLY FROM NECK DOWN TWICE A DAY     ??? oxyCODONE (OXYCONTIN) 10 mg TR12 12 hr crush resistant ER/CR tablet Take 10 mg by mouth every twelve (12) hours.     ??? pantoprazole (PROTONIX) 20 MG tablet Take 1 tablet (20 mg total) by mouth in the morning. 60 tablet 5   ??? spironolactone (ALDACTONE) 25 MG tablet Take 25 mg by mouth in the morning.     ??? tafamidis 61 mg cap Take 1 capsule (61  mg) by mouth daily. 30 capsule 11   ??? tiotropium bromide (SPIRIVA RESPIMAT) 2.5 mcg/actuation inhalation mist Inhale 2 puffs daily. 4 g 11   ??? varicella-zoster gE-AS01B, PF, (SHINGRIX, PF,) 50 mcg/0.5 mL SusR injection Inject 0.5 mL into the muscle. 0.5 mL 1   ??? FEROSUL 325 mg (65 mg iron) tablet TAKE ONE TABLET BY MOUTH EVERY MONDAY, WEDNESDAY & FRIDAY     ??? fosfomycin (MONUROL) 3 gram Pack Take 3 g by mouth once a week. 36 g 0   ??? metOLazone (ZAROXOLYN) 5 MG tablet Take 1 tablet (5 mg total) by mouth as needed in the morning (when instructed by cardiology clinic). 12 tablet 0   ??? OXYGEN-AIR DELIVERY SYSTEMS MISC 5 L by Miscellaneous route. 3 L/min via [place]     ??? rosuvastatin (CRESTOR) 5 MG tablet Take 1 tablet (5 mg total) by mouth every other day. 3015 tablet 11   ??? torsemide (DEMADEX) 20 MG tablet Take 1 tablet (20 mg total) by mouth daily. May also take 1 tablet (20 mg total) daily as needed (weight gain). 60 tablet 11     No current facility-administered medications for this visit.       Social history:  H/o 2 vaginal births. Hysterectomy + oopherectomy 50 years ago. No tobacco use.    Social History     Social History Narrative    Previously worked in Designer, fashion/clothing, retired.  Daughter is Economist who works at [employer].  Her niece is Selena Batten, [employer] [employer] Anesthesia Tech.  Lives in [place] with daughter and son-in-law.       Family History   Problem Relation Age of Onset   ??? Hypertension Mother    ??? Cancer Father         COLON CANCER   ??? Anesthesia problems Neg Hx    ??? Broken bones Neg Hx    ??? Clotting disorder Neg Hx    ??? Collagen disease Neg Hx    ??? Diabetes Neg Hx    ??? Dislocations Neg Hx    ??? Fibromyalgia Neg Hx    ??? Gout Neg Hx    ??? Hemophilia Neg Hx    ??? Osteoporosis Neg Hx    ??? Rheumatologic disease Neg Hx    ??? Scoliosis Neg Hx    ??? Severe sprains Neg Hx    ??? Sickle cell anemia Neg Hx    ??? Spinal Compression Fracture Neg Hx    ??? GU problems Neg Hx    ??? Kidney cancer Neg Hx    ??? Prostate cancer Neg Hx        ROS: All other systems reviewed and negative except as per HPI.     PE:  BP 98/54 (BP Site: L Arm, BP Position: Sitting)  - Pulse 63  - Temp 36.8 ??C (98.3 ??F) (Oral)  - Ht 167.6 cm (5' 6)  - Wt 98 kg (216 lb)  - BMI 34.86 kg/m??      Const  ??? vital signs above   ??? WDWN, NAD, non-toxic appearance    Eyes  ??? Lids normal bilaterally, conjunctiva anicteric and noninjected OU  ??? EOMI    ENMT  ??? mask in place  ??? on portable O2 by [place]    CV  ??? Regular rate  ??? No peripheral edem    Resp  ??? Normal WOB on     GI  ??? Normal inspection, NTND, NABS  ??? No umbilical hernia on exam  MSK  ??? No clubbing or cyanosis of hands    Skin  ??? No rashes, lesions, or ulcers of visualized skin  ??? Skin warm and dry to palpation    Psych  ??? Appropriate affect  ??? Oriented to person, place, time  ??? Judgment and insight are appropriate      Labs:    Lab Results   Component Value Date    WBC 6.4 08/27/2021    WBC 7.7 07/08/2021    WBC 12.5 (H) 01/06/2011    Absolute Neutrophils 4.4 08/27/2021    Absolute Lymphocytes 1.0 (L) 08/27/2021    Absolute Eosinophils 0.3 08/27/2021    HGB 9.7 (L) 08/27/2021    HGB 12.9 01/06/2011    HCT 31.6 (L) 08/27/2021    HCT 40.8 01/06/2011    Platelet 202 08/27/2021    Platelet 235 07/08/2021    Platelet 260 01/06/2011    Sodium 139 08/27/2021    Sodium 140 01/06/2011    Potassium 4.5 08/27/2021    Potassium 4.1 01/06/2011    BUN 45 (H) 08/27/2021    BUN 20 01/06/2011    Creatinine 2.58 (H) 08/27/2021    Creatinine 2.36 (H) 07/14/2021    Creatinine 0.79 01/06/2011    Glucose 129 08/27/2021    Magnesium 2.5 07/14/2021    Magnesium 2.0 01/06/2011    T Albumin 3.7 11/29/2020    Albumin 4.0 07/03/2021    Total Bilirubin 0.4 07/03/2021    AST 10 07/03/2021    ALT 8 (L) 07/03/2021    Alkaline Phosphatase 61 07/03/2021    INR 2.15 07/01/2021    INR 1.0 01/06/2011    Sed Rate 21 04/28/2020    CRP <4.0 06/03/2021    CRP 9.1 04/27/2020       Urinalysis:   Lab Results   Component Value Date    WBC, UA 5 09/10/2014    RBC, UA 2 09/10/2014    Squam Epithel, UA 7 (H) 07/03/2021    Squam Epithel, UA 2 09/10/2014    Bacteria, UA Many (A) 07/03/2021    Bacteria, UA FEW 09/10/2014    Leukocyte Esterase, UA Negative 07/03/2021    Leukocyte Esterase, UA TRACE 09/10/2014    Nitrite, UA Negative 07/03/2021    Nitrite, UA POSITIVE 09/10/2014    Protein, UA Negative 07/03/2021    Protein, UA NEGATIVE 09/10/2014    Blood, UA Negative 07/03/2021    Blood, UA NEGATIVE 09/10/2014    pH, UA 7.5 07/03/2021    pH, UA 6.0 09/10/2014       Microbiology:  Urine cultures  05/06/21 (asymptomatic with bland U/A)  50-100K VRE   KIRBY BAUER     Ampicillin Resistant 1     Doxycycline Susceptible     Nitrofurantoin Resistant     Vancomycin Screening Plate Resistant      04/18/21 NG    02/26/21, 02/05/21,   Escherichia coli       MIC SUSCEPTIBILITY RESULT     Ampicillin Susceptible     Cefazolin Susceptible     Cephalexin Susceptible 1     Ciprofloxacin Susceptible     Gentamicin Susceptible     Levofloxacin Susceptible     Nitrofurantoin Susceptible     Piperacillin + Tazobactam Susceptible     Tetracycline Susceptible 2     Tobramycin Susceptible     Trimethoprim + Sulfamethoxazole Susceptible      01/21/21  >100,000 CFU/mL Escherichia coli??Abnormal??    Presumptive Identification   >100,000  CFU/mL Enterococcus faecalis??Abnormal??    Susceptibility Testing By Consultation Only   Processed at Physicians Request   >100,000 CFU/mL Aerococcus urinae??Abnormal??    Aerococcus spp. are predictably susceptible to penicillin, ampicillin, tetracycline, and vancomycin. Resistance to sulfonamides has been reported.       Escherichia coli Enterococcus faecalis       MIC SUSCEPTIBILITY RESULT KIRBY BAUER Ampicillin Susceptible Susceptible 1     Cefazolin Susceptible      Cephalexin Susceptible 2      Ciprofloxacin Susceptible      Doxycycline  Susceptible     Gentamicin Susceptible      Levofloxacin Susceptible      Nitrofurantoin Susceptible Susceptible     Piperacillin + Tazobactam Susceptible      Tetracycline Susceptible 3      Tobramycin Susceptible      Trimethoprim + Sulfamethoxazole Susceptible      Vancomycin Screening Plate  Susceptible      01/03/21  Escherichia coli       MIC SUSCEPTIBILITY RESULT     Ampicillin Resistant     Aztreonam Resistant     Cefazolin Resistant     Cefepime Resistant     Ceftazidime Intermediate     Ceftriaxone Resistant     Cephalexin Resistant 1     Ciprofloxacin Resistant     Ertapenem Susceptible     Gentamicin Susceptible     Levofloxacin Resistant     Meropenem Susceptible     Nitrofurantoin Susceptible     Tetracycline Resistant 2     Tobramycin Susceptible     Trimethoprim + Sulfamethoxazole Resistant          Imaging:   None

## 2021-09-22 ENCOUNTER — Ambulatory Visit: Admit: 2021-09-22 | Discharge: 2021-09-23 | Payer: MEDICARE

## 2021-09-22 MED ADMIN — iron sucrose (VENOFER) 200 mg in sodium chloride (NS) 0.9 % 100 mL IVPB: 200 mg | INTRAVENOUS | @ 19:00:00 | Stop: 2021-09-22

## 2021-09-22 NOTE — Unmapped (Addendum)
Pt to [facility] today for 4/5 dose of Venofer. VSS,denies complaints. PIV started.  1518 Venofer started 200 mg  1542 Venofer complete. VSS,Refused to remain post infusion for observation. Pt tolerated without issue.PIV removed- covered with 2x2 and wrapped in coban. Discharged to home with out issue

## 2021-09-29 ENCOUNTER — Ambulatory Visit: Admit: 2021-09-29 | Discharge: 2021-09-30 | Payer: MEDICARE

## 2021-09-29 NOTE — Unmapped (Signed)
Patient presents for Venofer (Iron Sucrose) infusion. Patient's medications and allergies reviewed and updated as needed, VSS. 24G PIV initiated to RAC, patient tolerated well. Patient denies any recent infections or fevers. Premeds administered per provider order.  Patient aware of potential reaction/side effects, call bell within reach, patient aware of how to use.    1525 Venofer (Iron Sucrose) 200 mg started to infuse per order.    1542 Venofer (Iron Sucrose) infusion complete.    Pt tolerated without complication, VSS.  PIV flushed per policy and d/c'd, gauze and coban applied.  Pt left clinic in no acute distress.

## 2021-10-08 NOTE — Unmapped (Signed)
[facility] Refill Coordination Note    Specialty Medication(s) to be Shipped:   General Specialty: Vyndamax 61mg     Other medication(s) to be shipped: No additional medications requested for fill at this time     Barbara Huber, DOB: Jan 10, 1941  Phone: [phone] (home)       All above HIPAA information was verified with patient.     Was a Nurse, learning disability used for this call? No    Completed refill call assessment today to schedule patient's medication shipment from the [facility] [phone]).  All relevant notes have been reviewed.     Specialty medication(s) and dose(s) confirmed: Regimen is correct and unchanged.   Changes to medications: Alegria reports no changes at this time.  Changes to insurance: No  New side effects reported not previously addressed with a pharmacist or physician: None reported  Questions for the pharmacist: No    Confirmed patient received a Conservation officer, historic buildings and a Surveyor, mining with first shipment. The patient will receive a drug information handout for each medication shipped and additional FDA Medication Guides as required.       DISEASE/MEDICATION-SPECIFIC INFORMATION        N/A    SPECIALTY MEDICATION ADHERENCE     Medication Adherence    Patient reported X missed doses in the last month: 0  Specialty Medication: Vyndamax 61mg   Patient is on additional specialty medications: No  Patient is on more than two specialty medications: No              Were doses missed due to medication being on hold? No    Vyndamax 61 mg: 14 days of medicine on hand       REFERRAL TO PHARMACIST     Referral to the pharmacist: Not needed      [facility]     Shipping address confirmed in Epic.     Delivery Scheduled: Yes, Expected medication delivery date: 10/15/21.     Medication will be delivered via Next Day Courier to the prescription address in Epic WAM.    Nancy Nordmann [facility] Specialty Technician

## 2021-10-09 DIAGNOSIS — Z09 Encounter for follow-up examination after completed treatment for conditions other than malignant neoplasm: Principal | ICD-10-CM

## 2021-10-09 MED ORDER — ROSUVASTATIN 5 MG TABLET
ORAL_TABLET | ORAL | 11 refills | 30 days | Status: CP
Start: 2021-10-09 — End: 2022-10-09

## 2021-10-14 MED FILL — VYNDAMAX 61 MG CAPSULE: ORAL | 30 days supply | Qty: 30 | Fill #9

## 2021-10-17 DIAGNOSIS — G629 Polyneuropathy, unspecified: Principal | ICD-10-CM

## 2021-10-17 DIAGNOSIS — E1122 Type 2 diabetes mellitus with diabetic chronic kidney disease: Principal | ICD-10-CM

## 2021-10-17 DIAGNOSIS — N1832 Type 2 diabetes mellitus with stage 3b chronic kidney disease, without long-term current use of insulin (CMS-HCC): Principal | ICD-10-CM

## 2021-10-17 NOTE — Unmapped (Signed)
[facility] Internal Medicine   CHRONIC CARE MANAGEMENT OUTREACH ENCOUNTER           Date of Service:  10/17/2021      Service:  Care Coordination - phone  Is there someone else in the room? No.   MyChart use by patient is active: yes    Post-outreach Action Items:  ??? Provider: No/none.  ??? CM: No/none.  ??? Patient: No/none.    Follow-up Next Call: N/A    Chronic Care Management (CCM) Outreach  ??? Purpose of outreach:  Medication Changes and Diabetes   ??? Care Manager (CM) completed the following:  o Reviewed chart  o Identified following:   - Diabetes  - Neuropathy    Call placed to [name] re: diabetes mellitus, neuropathy    Diabetes:  Patient reports that her DM2 is well managed at this time. Last A1C was 5.9 and metformin and sitagliptin were stopped prior to last appointment with Dr. Brooke Dare. Patient confirmed she is no longer taking these.   ??? Experiencing symptoms of high blood sugar?: no  ??? Experiencing symptoms of low blood sugar?: no  o Know how to treat low blood sugar?: yes  ??? Experiencing symptoms of numbness or tingling in fingers or toes?: no  ??? Regularly checking blood sugars?: yes  o If yes, when? before breakfast  Date Before Breakfast   10/17/21 124       Neuropathy:  Patient shared that she takes 600 mg of gabapentin at night and states still has numb/dead feeling in feet and legs.  - Patient stated that she weaned herself down from previous dosage and cannot tell a signficant difference in terms of numbness and pain.     Anxiety and Insomnia:   Patient shared that it is hard for her to sleep at times, which she attributes to anxiety.  - She reports that difficulty sleeping is a once-weekly occurrence. She does not have difficulty falling asleep, but reports waking up in the night and feeling wide awake.   - Completed GAD-7 with patient.  GAD-7 Score:  GAD-7 Total Score: 1          Updates  ??? New concerns or symptoms: No/ none.  Patient shares she is doing well.   ??? New barriers: No/ none.  ??? Refills needed: No/ none.  ??? Medication response/ notes: No/none.    Wrap-up  ??? Reviewed upcoming clinic appointment(s): Yes.  o Plans to keep appointment(s): Yes.  o Transportation assistance needed: No. Patient reports that her daughter brings her to her appointments.   o Patient was encouraged to reach out to their provider with any questions or concerns  Future Appointments   Date Time Provider Department Center   10/23/2021  2:00 PM Abhijit Lonia Blood, MD [facility]   10/29/2021  3:20 PM Artelia Laroche, MD [facility]   11/14/2021  3:00 PM Zannie Cove, MD [facility]   12/18/2021 10:00 AM Chriss Driver, MD [facility]   01/07/2022  3:15 PM Ana Ulysees Barns, MD [facility]   01/16/2022 11:30 AM Carin Hock, MD [facility]       A copy of this Patient Outreach/CCM Encounter was sent to patient's Primary Care Provider     CCM Documentation  Time spent in direct care with patient and/or health care proxy via non-in-person encounter(s): 16 min  Time spent in indirect patient care and coordination: 5 min

## 2021-10-20 NOTE — Unmapped (Signed)
I was the supervising physician in the delivery of the service. Aryelle Figg R Deronda Christian, MD

## 2021-10-23 ENCOUNTER — Ambulatory Visit: Admit: 2021-10-23 | Discharge: 2021-10-24 | Payer: MEDICARE | Attending: Nephrology | Primary: Nephrology

## 2021-10-23 DIAGNOSIS — N184 Chronic kidney disease, stage 4 (severe): Principal | ICD-10-CM

## 2021-10-23 LAB — PROTEIN / CREATININE RATIO, URINE
CREATININE, URINE: 44.6 mg/dL
PROTEIN URINE: <6 mg/dL

## 2021-10-23 LAB — ALBUMIN / CREATININE URINE RATIO
ALBUMIN QUANT URINE: <0.3 mg/dL
CREATININE, URINE: 44.7 mg/dL

## 2021-10-23 NOTE — Unmapped (Addendum)
Patient Education        Medicines to Avoid With Kidney Disease: Care Instructions  Overview     Kidney disease means that your kidneys are not able to get rid of waste from the blood. So they can't keep your body's fluids and chemicals in balance. Usually, the kidneys get rid of waste from the blood through the urine. And they balance the fluids in the body.  When your kidneys don't work as they should, you have to be careful about some medicines. They may harm your kidneys. Your doctor may tell you not to take them or may change the dose.  Medicines for pain and swelling, such as ibuprofen (Advil or Motrin) or naproxen (Aleve), can cause harm. So can some antibiotics and antacids. And you need to be careful about some drugs that treat cancer, lower blood pressure, or get rid of water from the body. Some herbal products could cause harm too.  Follow-up care is a key part of your treatment and safety. Be sure to make and go to all appointments, and call your doctor if you are having problems. It's also a good idea to know your test results and keep a list of the medicines you take.  How can you care for yourself at home?  Tell your doctor all the prescription, herbal, or over-the-counter medicines you take. Do not take any new ones unless you talk to your doctor first.  Do not take anti-inflammatory medicines. These include ibuprofen (Advil, Motrin) and naproxen (Aleve). You can use acetaminophen (Tylenol) for pain.  Do not take two or more pain medicines at the same time unless the doctor told you to. Many pain medicines have acetaminophen, which is Tylenol. Too much acetaminophen (Tylenol) can be harmful.  Tell all doctors and others who work with your health care that you have kidney disease.  Wear medical alert jewelry that lists your health problem. You can buy this at most drugstores.  Where can you learn more?  Go to [facility] at https://my[facility]chart.Armed forces logistics/support/administrative officer in the Menu. Enter [phone] in the search box to learn more about Medicines to Avoid With Kidney Disease: Care Instructions.  Current as of: September 04, 2020               Content Version: 13.2  ?? 2006-2022 [employer].   Care instructions adapted under license by [facility]. If you have questions about a medical condition or this instruction, always ask your healthcare professional. Healthwise, Incorporated disclaims any warranty or liability for your use of this information.    Dear Ms Harwick    It was very nice to meet you.  As you know, Dr. Brooke Dare referred you to see me because of your kidneys.    You have chronic kidney disease which means that over a long period of time, your kidney function has slowly decreased.  The most likely reason is because of history of high blood pressure and diabetes.    However, with the diagnosis of heart amyloid, it is possible that there might be amyloid in your kidney also.  I would like to check some urine tests today to see if that is the case.    Right now, your kidney function is at about 20% of normal.  This means that we have to be very careful with new medications as well as making sure your current medications are dosed appropriately.  Right now, you are on good medications that do not need to  be changed.    In order to keep your kidneys safe and working as long as possible, please stay away from medications called NSAIDs.  I would also like for you to discuss any new medications with your team of doctors.    Sincerely,    Dr. Kirtland Bouchard

## 2021-10-23 NOTE — Unmapped (Signed)
Hello referring Provider: Artelia Laroche, MD     PCP:  Barbara Doe, MD      10/23/2021      ASSESSMENT/PLAN:      [name] is a 80 y.o. year old patient with a past medical history significant for diabetes mellitus, pretension, COPD and a recent diagnosis of cardiac amyloid.  She is being seen in follow-up for chronic kidney disease.     1.  Chronic kidney disease  2.  Diabetes mellitus type 2  3.  Hypertension  4.  Cardiac amyloidosis  5.  Atrial fibrillation  6.  Chronic obstructive pulmonary disease, O2 dependent    Patient has chronic kidney disease stage IV plan latest EGFR= 40ml/min.  Etiology is not clear, but risk factors include hypertension and diabetes mellitus.  She does not have any proteinuria or albuminuria.  The lack of albuminuria can be seen in 30% of patients with diabetes and low GFR.  Cardiac amyloid typically does not affect the kidneys.    I took this opportunity to review late stage [name] management, especially avoidance of nephrotoxins such as NSAIDs, and judicious use of any medications.  She is on a statin agent.  It is unclear why she is not on ACE inhibitor, however her blood pressure is well controlled, and she does not have proteinuria.  So there may not be strong indications for its use.  She is however on mineralocorticoid receptor blocker.  She is also not on an SGLT2 inhibitor or GLP-1 agonist.    I did not discuss dialysis at this time.        [name] will follow up in 3 months.               Chief Complaint: Kidneys    HPI:  [name] to the [facility] nephrology clinic as a new patient for evaluation and management of chronic kidney disease stage IV.  It turns out he has seen nephrology approximately 2 years ago for acute kidney injury, so this will serve as a follow-up visit.  She is accompanied today by her daughter.    Over the last 2 years, she has been diagnosed with cardiac amyloidosis and atrial fibrillation.  She has also been taking fosfomycin to prevent urinary tract infections.  Her last hospitalization was in June 2022 for persistent fatigue after COVID-19.    She feels well, and has no complaints at this time.  Her exercise tolerance is limited, but she denies any shortness of breath, chest pain or chest tightness.  She denies any urinary symptoms such as dysuria, gross hematuria, incomplete voiding.        ROS:   CONSTITUTIONAL: denies fevers or chills, denies unintentional weight loss  CARDIOVASCULAR: denies chest pain, denies dyspnea on exertion, denies leg edema  GASTROINTESTINAL: denies nausea, denies vomiting, denies anorexia  GENITOURINARY: denies dysuria, denies hematuria, denies decreased urinary stream  All systems reviewed and are negative except as listed above.    PAST MEDICAL HISTORY:  Past Medical History:   Diagnosis Date   ??? Acute kidney injury superimposed on chronic kidney disease (CMS-HCC) 10/11/2020   ??? Acute on chronic diastolic (congestive) heart failure (CMS-HCC) 08/23/2020   ??? Arthritis    ??? Calculus of kidney    ??? Calculus of ureter    ??? CHF (congestive heart failure) (CMS-HCC)    ??? Chronic atrial fibrillation (CMS-HCC) 07/20/2019   ??? COPD (chronic obstructive pulmonary disease) (CMS-HCC)    ??? Diabetes (  CMS-HCC)    ??? Gangrenous cholecystitis 10/11/2020   ??? GERD (gastroesophageal reflux disease)    ??? Hydronephrosis    ??? Hypertension    ??? Hyponatremia 10/11/2020   ??? Intermediate coronary syndrome (CMS-HCC) 03/13/2014   ??? Lower extremity edema 09/28/2020   ??? Lumbar stenosis    ??? Microscopic hematuria    ??? Nausea alone    ??? Nephrolithiasis 04/17/2016   ??? Neuropathy    ??? Nocturia    ??? Other chronic cystitis    ??? Pulmonary hypertension (CMS-HCC)    ??? Renal colic    ??? Sleep apnea    ??? Unstable angina pectoris (CMS-HCC) 03/13/2014       ALLERGIES  Nitrofurantoin and Lipitor [atorvastatin]    SOCIAL HISTORY  Social History     Socioeconomic History   ??? Marital status: Widowed   Occupational History   ??? Occupation: retired Tobacco Use   ??? Smoking status: Former Smoker     Packs/day: 0.33     Types: Cigarettes     Quit date: 2019     Years since quitting: 3.8   ??? Smokeless tobacco: Never Used   ??? Tobacco comment: Quit a few years ago   Vaping Use   ??? Vaping Use: Never used   Substance and Sexual Activity   ??? Alcohol use: No   ??? Drug use: No   Other Topics Concern   ??? Exercise No   Social History Narrative    Previously worked in Designer, fashion/clothing, retired.  Daughter is Economist who works at [employer].  Her niece is Selena Batten, [employer] Anesthesia Tech.  Lives in [place] with daughter and son-in-law.     Social Determinants of Health     Financial Resource Strain: Low Risk    ??? Difficulty of Paying Living Expenses: Not hard at all   Food Insecurity: No Food Insecurity   ??? Worried About Programme researcher, broadcasting/film/video in the Last Year: Never true   ??? Ran Out of Food in the Last Year: Never true   Transportation Needs: No Transportation Needs   ??? Lack of Transportation (Medical): No   ??? Lack of Transportation (Non-Medical): No         FAMILY HISTORY  Family History   Problem Relation Age of Onset   ??? Hypertension Mother    ??? Cancer Father         COLON CANCER   ??? Anesthesia problems Neg Hx    ??? Broken bones Neg Hx    ??? Clotting disorder Neg Hx    ??? Collagen disease Neg Hx    ??? Diabetes Neg Hx    ??? Dislocations Neg Hx    ??? Fibromyalgia Neg Hx    ??? Gout Neg Hx    ??? Hemophilia Neg Hx    ??? Osteoporosis Neg Hx    ??? Rheumatologic disease Neg Hx    ??? Scoliosis Neg Hx    ??? Severe sprains Neg Hx    ??? Sickle cell anemia Neg Hx    ??? Spinal Compression Fracture Neg Hx    ??? GU problems Neg Hx    ??? Kidney cancer Neg Hx    ??? Prostate cancer Neg Hx         MEDICATIONS:  Current Outpatient Medications   Medication Sig Dispense Refill   ??? ACCU-CHEK AVIVA PLUS TEST STRP Strp UESE TO CHECK BLOOD SUGAR 3 TIMES A DAY BEFORE MEALS 100 each 11   ??? acetaminophen (TYLENOL) 500  MG tablet Take 1,000 mg by mouth daily as needed for pain.      ??? albuterol HFA 90 mcg/actuation inhaler Inhale 2 puffs every eight (8) hours as needed for wheezing.     ??? amiodarone (PACERONE) 200 MG tablet Take 1 tablet (200 mg total) by mouth daily. 30 tablet 1   ??? apixaban (ELIQUIS) 5 mg Tab Take 0.5 tablets (2.5 mg total) by mouth Two (2) times a day. 60 tablet 11   ??? estradioL (ESTRACE) 0.01 % (0.1 mg/gram) vaginal cream INSERT ONE GRAM VAGINALLY AT BEDTIME     ??? FEROSUL 325 mg (65 mg iron) tablet TAKE ONE TABLET BY MOUTH EVERY MONDAY, WEDNESDAY & FRIDAY     ??? fluticasone propion-salmeteroL (ADVAIR HFA) 115-21 mcg/actuation inhaler Inhale 2 puffs Two (2) times a day. 12 g 11   ??? fosfomycin (MONUROL) 3 gram Pack Take 3 g by mouth once a week. 36 g 0   ??? lancets Misc 1 each by Miscellaneous route daily. Accu Check 100 each 6   ??? montelukast (SINGULAIR) 10 mg tablet Take 1 tablet (10 mg total) by mouth daily. 90 tablet 3   ??? NARCAN 4 mg/actuation nasal spray 1 spray into alternating nostrils once as needed (opioid overdose). PRN - Emergency use.     ??? NON FORMULARY APPLY FROM NECK DOWN TWICE A DAY     ??? oxyCODONE (OXYCONTIN) 10 mg TR12 12 hr crush resistant ER/CR tablet Take 10 mg by mouth every twelve (12) hours.     ??? OXYGEN-AIR DELIVERY SYSTEMS MISC 5 L by Miscellaneous route. 3 L/min via [place]     ??? pantoprazole (PROTONIX) 20 MG tablet Take 1 tablet (20 mg total) by mouth in the morning. 60 tablet 5   ??? rosuvastatin (CRESTOR) 5 MG tablet Take 1 tablet (5 mg total) by mouth every other day. 15 tablet 11   ??? spironolactone (ALDACTONE) 25 MG tablet Take 25 mg by mouth in the morning.     ??? tafamidis 61 mg cap Take 1 capsule (61 mg) by mouth daily. 30 capsule 11   ??? tiotropium bromide (SPIRIVA RESPIMAT) 2.5 mcg/actuation inhalation mist Inhale 2 puffs daily. 4 g 11   ??? torsemide (DEMADEX) 20 MG tablet Take 1 tablet (20 mg total) by mouth 3 (three) times a day. May also take 1 tablet (20 mg total) daily as needed (weight gain). 60 tablet 11   ??? varicella-zoster gE-AS01B, PF, (SHINGRIX, PF,) 50 mcg/0.5 mL SusR injection Inject 0.5 mL into the muscle. 0.5 mL 1   ??? gabapentin (NEURONTIN) 300 MG capsule Take 1 capsule (300 mg total) by mouth at bedtime. 90 capsule 3   ??? metOLazone (ZAROXOLYN) 5 MG tablet Take 1 tablet (5 mg total) by mouth as needed in the morning (when instructed by cardiology clinic). 12 tablet 0     No current facility-administered medications for this visit.       PHYSICAL EXAM:  Vitals:    10/23/21 1434   BP: 122/55   Pulse: 68   Temp: 36.6 ??C (97.8 ??F)     CONSTITUTIONAL: Alert,well appearing, no distress, seated in wheelchair  HEENT: Moist mucous membranes, oropharynx clear without erythema or exudate  EYES:  Pupils reactive, sclerae anicteric.  NECK: Supple  CARDIOVASCULAR: Regular, normal S1/S2 heart sounds, no murmurs, no rubs.   PULM: Clear to auscultation bilaterally  GASTROINTESTINAL: Soft, active bowel sounds, nontender  EXTREMITIES: No lower extremity edema bilaterally.   SKIN: No rashes or lesions  NEUROLOGIC:  No focal motor or sensory deficits    MEDICAL DECISION MAKING      Urine Dipstick: specific gravity 1.015, pH 6.5, leukoesterase negative, nitrite negative, protein negative, glucose negative, ketone negative, bilirubin negative, blood negative  Urine Microscopy: No white cells, rare hyaline casts, occasional squamous epithelial cells, and rare bacteria    IMAGING STUDIES: Pending

## 2021-10-29 ENCOUNTER — Telehealth: Admit: 2021-10-29 | Discharge: 2021-10-30 | Payer: MEDICARE

## 2021-10-29 DIAGNOSIS — Z780 Asymptomatic menopausal state: Principal | ICD-10-CM

## 2021-10-29 DIAGNOSIS — D649 Anemia, unspecified: Principal | ICD-10-CM

## 2021-10-29 DIAGNOSIS — N1832 Type 2 diabetes mellitus with stage 3b chronic kidney disease, without long-term current use of insulin (CMS-HCC): Principal | ICD-10-CM

## 2021-10-29 DIAGNOSIS — E1122 Type 2 diabetes mellitus with diabetic chronic kidney disease: Principal | ICD-10-CM

## 2021-10-29 DIAGNOSIS — I5032 Chronic diastolic (congestive) heart failure: Principal | ICD-10-CM

## 2021-10-29 NOTE — Unmapped (Signed)
Internal Medicine Clinic Visit    Reason for visit: Follow up    A/P:    Barbara Huber was seen today for follow-up.    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 She appears to be stable and doing well without any exacerbations. Current medication regimen includes: Amiodarone 200 mg daily, Spironalactone 25 mg daily, and Torsemide 20 mg PRN. Tafamidis 61 mg for Amyloidosis.    Plan:  ??? Diagnostic: Nothing at this time.  ??? Therapeutic: Nothing at this time. Continue home meds as prescribed. Will follow up with [facility].      Type 2 diabetes mellitus with stage 3b chronic kidney disease, without long-term current use of insulin (CMS-HCC)  Assessment:  Pt is checking her blood glucose daily. Reported to be in the 130-140 range without any outlier values. No reported issues with polydipsia or polyuria. Appears to be well managed on just diet control.  Lab Results   Component Value Date    A1C 5.9 08/27/2021     Plan:    ??? Diagnostic: Nothing at this time. Deferred labs for future clinic visit.   -     CBC w/ Differential; Future   -     Basic Metabolic Panel; Future  ??? Therapeutic: Nothing at this time.    Insomnia  Assessment: Insomnia appears to be secondary to increased anxiety. She has tried Melatonin and it has not helped.    Plan: Will treat with Hydralazine.    Health Maintenance    Postmenopausal  -     Dexa Bone Density Skeletal; Future    Health Maintenance Due   Topic Date Due   ??? Retinal Eye Exam  Never done   ??? DTaP/Tdap/Td Vaccines (1 - Tdap) Never done   ??? DEXA Scan-Start Age 64  Never done   ??? COVID-19 Vaccine (4 - Booster for Pfizer series) 02/06/2021      No follow-ups on file.    HPI:  Pt is a 80 y.o. female with a history of cardiac amyloidosis with HF, [name], Afib, COPD, GERD, HLD, T2DM, and OSA. Seen today on a video visit with her daughter.     Pt reporting that she is doing well and has no acute health concerns at this time. She has not had any chest pain, pitting edema, increase thirst, increased urination or increase shortness of breath since the last clinic visit. She has only had to use Torsemide twice in the last 30 days.  She said that she has some baseline SOB as per the past but this has not increased. She has not had any trouble breathing while on her baseline O2 requirement. She denied any pillow orthopnea and said that she has only been using 1-2 pillows daily while sleeping in bed, supine. She is checking her blood pressure (115/64 avg) and blood sugar (130-140) regularly and both are reported in the target range. Her neuropathic pain is also well managed with Gabapentin.    The only new concern she mentioned during this visit was some increased anxiety that was making it hard for her to go to sleep at night.       Problem List:  Patient Active Problem List   Diagnosis   ??? Spinal stenosis of lumbar region   ??? Thoracic or  lumbosacral neuritis or radiculitis   ??? Lumbosacral spondylosis   ??? COPD (chronic obstructive pulmonary disease) (CMS-HCC)   ??? Sleep apnea in adult   ??? Essential hypertension   ??? AKI (acute kidney injury) (CMS-HCC)   ??? Type 2 diabetes mellitus with stage 3b chronic kidney disease, without long-term current use of insulin (CMS-HCC)   ??? Chronic cystitis   ??? Incomplete bladder emptying   ??? Nocturia   ??? Peripheral neuropathy   ??? Chronic respiratory failure with hypoxia (CMS-HCC)   ??? Anemia   ??? Persistent atrial fibrillation (CMS-HCC)   ??? Anemia in chronic kidney disease   ??? Chronic kidney disease, stage III (moderate) (CMS-HCC)   ??? Enrolled in chronic care management   ??? Pulmonary hypertension (CMS-HCC)   ??? Shortness of breath   ??? Chronic prescription opiate use   ??? Dyspnea on exertion   ??? 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 Cardiac amyloidosis (CMS-HCC)   ??? Recurrent cold sores   ??? Persistent fatigue after COVID-19   ??? Dyspepsia   ??? Iron deficiency anemia due to chronic blood loss   ??? Generalized edema          Medications:  Reviewed in EPIC    Social History    General: She is doing well. No SDOH concerns noted during this visit.      Physical Exam:   Vital Signs:  Vitals:    10/29/21 1453   BP: 115/64   Weight: 98.9 kg (218 lb)   Height: 167.6 cm (5' 6)     THIS WAS A VIDEO VISIT.  GEN: Well-appearing, sitting comfortably, NAD.  Pulm: On O2 by [place]. Able to speak in full sentences comfortably.  Psych: A&O x 3, normal mood and affect.   PTHomeBP      Records review  Lab Results   Component Value Date    CREATININE 2.58 (H) 08/27/2021    CHOL 132 08/23/2020    HDL 32 (L) 08/23/2020    LDL 78 08/23/2020    NONHDL 100 08/23/2020    TRIG 109 08/23/2020    A1C 5.9 08/27/2021         The ASCVD Risk score Denman George DC Montez Hageman, et al., 2013) failed to calculate.   Medication adherence and barriers to the treatment plan have been addressed. Opportunities to optimize healthy behaviors have been discussed. Patient / caregiver voiced understanding.    {TIP - HCC- RAFF Pilot- Clinical Documentation Specialist Recommendations-  RIJ   This text will self delete upon signing [id number]}    Barbara Huber - Medical Student (MS-3)    This note was written by [facility] medical student Verdie Wilms (MS-3) and edited by Barbara Doe, MD.   I attest that I have reviewed the medical student note and that the components of the history of the present illness, the physical exam, and the assessment and plan documented were performed by me or were performed in my presence by the student where I verified the documentation and performed (or re-performed) the exam and medical decision making. Barbara Huber    I personally spent *** minutes face-to-face and non-face-to-face in the care of this patient, which includes all pre, intra, and post visit time on the date of service. {    Coding tips - Do not edit this text, it will delete upon signing of note!    ?? Telephone visits [phone] for Physicians and APP??s and [phone] for Non- Physician Clinicians)- Only  use minutes on the phone to determine level of service.    ?? Video visits [phone]) - Use both minutes on video and pre/post minutes to determine level of service.       :[id number]}    The patient reports they are currently: at home. I spent *** minutes on the real-time audio and video with the patient on the date of service. I spent an additional *** minutes on pre- and post-visit activities on the date of service.     The patient was physically located in [place] or a state in which I am permitted to provide care. The patient and/or parent/guardian understood that s/he may incur co-pays and cost sharing, and agreed to the telemedicine visit. The visit was reasonable and appropriate under the circumstances given the patient's presentation at the time.    The patient and/or parent/guardian has been advised of the potential risks and limitations of this mode of treatment (including, but not limited to, the absence of in-person examination) and has agreed to be treated using telemedicine. The patient's/patient's family's questions regarding telemedicine have been answered.     If the visit was completed in an ambulatory setting, the patient and/or parent/guardian has also been advised to contact their provider???s office for worsening conditions, and seek emergency medical treatment and/or call 911 if the patient deems either necessary.    _________________________

## 2021-10-29 NOTE — Unmapped (Signed)
[facility]       Type of visit:  face to face    Reason for visit: F/U    Questions / Concerns that need to be addressed: NONE    General Consent to Treat (GCT) for non-epic video visits only:       Diabetes:  ??? Regularly checking blood sugars?: yes  o If yes, when? Complete log for past 7 days  Date Before Breakfast After Breakfast Before Lunch After Lunch Before Dinner After Dinner Before Bed    10/29/2021  147                                                                                                                               Hypertension:  ??? Have blood pressure cuff at home?: yes- arm cuff  ??? Regularly checking blood pressure?: yes  If yes, complete log for past 7 days  Date Time BP Pulse    10/29/2021  0800  115/64                                                                   PTHomeBP          Allergies reviewed: Yes    Medication reviewed: Yes  Pended refills? No        HCDM reviewed and updated in Epic:    We are working to make sure all of our patients??? wishes are updated in Epic and part of that is documenting a Environmental health practitioner for each patient  A Health Care Decision Maker is someone you choose who can make health care decisions for you if you are not able - who would you most want to do this for you????  is already up to date.        BPAs completed:        COVID Vaccination:  If Care Gap for COVID vaccine is present:  Have you received a COVID vaccine? yes  If yes: How many doses have you received?    Type of Vaccine received? Pfizer   Date of 1st dose:    Date of 2nd dose:    If no: Are you interested in scheduling?         __________________________________________________________________________________________    SCREENINGS COMPLETED IN FLOWSHEETS    HARK Screening       AUDIT       PHQ2       PHQ9          P4 Suicidality Screener                GAD7       COPD Assessment

## 2021-11-05 NOTE — Unmapped (Signed)
[facility] Refill Coordination Note    Specialty Medication(s) to be Shipped:   General Specialty: Vyndamax 61mg     Other medication(s) to be shipped: No additional medications requested for fill at this time     Barbara Huber, DOB: 1941/05/06  Phone: [phone] (home)       All above HIPAA information was verified with patient.     Was a Nurse, learning disability used for this call? No    Completed refill call assessment today to schedule patient's medication shipment from the [facility] [phone]).  All relevant notes have been reviewed.     Specialty medication(s) and dose(s) confirmed: Regimen is correct and unchanged.   Changes to medications: Barbara Huber reports no changes at this time.  Changes to insurance: No  New side effects reported not previously addressed with a pharmacist or physician: None reported  Questions for the pharmacist: No    Confirmed patient received a Conservation officer, historic buildings and a Surveyor, mining with first shipment. The patient will receive a drug information handout for each medication shipped and additional FDA Medication Guides as required.       DISEASE/MEDICATION-SPECIFIC INFORMATION        N/A    SPECIALTY MEDICATION ADHERENCE     Medication Adherence    Patient reported X missed doses in the last month: 0  Specialty Medication: Vyndamax 61mg   Patient is on additional specialty medications: No  Patient is on more than two specialty medications: No              Were doses missed due to medication being on hold? No    Vyndamax 61 mg: 14 days of medicine on hand     REFERRAL TO PHARMACIST     Referral to the pharmacist: Not needed      [facility]     Shipping address confirmed in Epic.     Delivery Scheduled: Yes, Expected medication delivery date: 11/12/21.     Medication will be delivered via Next Day Courier to the prescription address in Epic WAM.    Nancy Nordmann [facility] Specialty Technician

## 2021-11-11 MED FILL — VYNDAMAX 61 MG CAPSULE: ORAL | 30 days supply | Qty: 30 | Fill #10

## 2021-11-14 ENCOUNTER — Ambulatory Visit: Admit: 2021-11-14 | Discharge: 2021-11-15 | Payer: MEDICARE

## 2021-11-14 DIAGNOSIS — D509 Iron deficiency anemia, unspecified: Principal | ICD-10-CM

## 2021-11-14 DIAGNOSIS — N1832 Type 2 diabetes mellitus with stage 3b chronic kidney disease, without long-term current use of insulin (CMS-HCC): Principal | ICD-10-CM

## 2021-11-14 DIAGNOSIS — D649 Anemia, unspecified: Principal | ICD-10-CM

## 2021-11-14 DIAGNOSIS — I50812 Chronic right heart failure: Principal | ICD-10-CM

## 2021-11-14 DIAGNOSIS — J449 Chronic obstructive pulmonary disease, unspecified: Principal | ICD-10-CM

## 2021-11-14 DIAGNOSIS — R601 Generalized edema: Principal | ICD-10-CM

## 2021-11-14 DIAGNOSIS — I272 Pulmonary hypertension, unspecified: Principal | ICD-10-CM

## 2021-11-14 DIAGNOSIS — E1122 Type 2 diabetes mellitus with diabetic chronic kidney disease: Principal | ICD-10-CM

## 2021-11-14 LAB — CBC W/ AUTO DIFF
BASOPHILS ABSOLUTE COUNT: 0.1 10*9/L (ref 0.0–0.1)
BASOPHILS RELATIVE PERCENT: 1.5 %
EOSINOPHILS ABSOLUTE COUNT: 0.2 10*9/L (ref 0.0–0.5)
EOSINOPHILS RELATIVE PERCENT: 2.5 %
HEMATOCRIT: 31.2 % — ABNORMAL LOW (ref 34.0–44.0)
HEMOGLOBIN: 9.6 g/dL — ABNORMAL LOW (ref 11.3–14.9)
LYMPHOCYTES ABSOLUTE COUNT: 0.8 10*9/L — ABNORMAL LOW (ref 1.1–3.6)
LYMPHOCYTES RELATIVE PERCENT: 10.2 %
MEAN CORPUSCULAR HEMOGLOBIN CONC: 30.9 g/dL — ABNORMAL LOW (ref 32.0–36.0)
MEAN CORPUSCULAR HEMOGLOBIN: 24.9 pg — ABNORMAL LOW (ref 25.9–32.4)
MEAN CORPUSCULAR VOLUME: 80.5 fL (ref 77.6–95.7)
MEAN PLATELET VOLUME: 8.1 fL (ref 6.8–10.7)
MONOCYTES ABSOLUTE COUNT: 0.7 10*9/L (ref 0.3–0.8)
MONOCYTES RELATIVE PERCENT: 8.6 %
NEUTROPHILS ABSOLUTE COUNT: 6.2 10*9/L (ref 1.8–7.8)
NEUTROPHILS RELATIVE PERCENT: 77.2 %
NUCLEATED RED BLOOD CELLS: 0 /100{WBCs} (ref <=4)
PLATELET COUNT: 274 10*9/L (ref 150–450)
RED BLOOD CELL COUNT: 3.87 10*12/L — ABNORMAL LOW (ref 3.95–5.13)
RED CELL DISTRIBUTION WIDTH: 17.6 % — ABNORMAL HIGH (ref 12.2–15.2)
WBC ADJUSTED: 8 10*9/L (ref 3.6–11.2)

## 2021-11-14 LAB — BASIC METABOLIC PANEL
ANION GAP: 8 mmol/L (ref 5–14)
ANION GAP: 9 mmol/L (ref 5–14)
BLOOD UREA NITROGEN: 61 mg/dL — ABNORMAL HIGH (ref 9–23)
BLOOD UREA NITROGEN: 63 mg/dL — ABNORMAL HIGH (ref 9–23)
BUN / CREAT RATIO: 22
BUN / CREAT RATIO: 23
CALCIUM: 9 mg/dL (ref 8.7–10.4)
CALCIUM: 9.1 mg/dL (ref 8.7–10.4)
CHLORIDE: 96 mmol/L — ABNORMAL LOW (ref 98–107)
CHLORIDE: 97 mmol/L — ABNORMAL LOW (ref 98–107)
CO2: 33.2 mmol/L — ABNORMAL HIGH (ref 20.0–31.0)
CO2: 33.2 mmol/L — ABNORMAL HIGH (ref 20.0–31.0)
CREATININE: 2.73 mg/dL — ABNORMAL HIGH
CREATININE: 2.77 mg/dL — ABNORMAL HIGH
EGFR CKD-EPI (2021) FEMALE: 17 mL/min/{1.73_m2} — ABNORMAL LOW (ref >=60)
EGFR CKD-EPI (2021) FEMALE: 17 mL/min/{1.73_m2} — ABNORMAL LOW (ref >=60)
GLUCOSE RANDOM: 214 mg/dL — ABNORMAL HIGH (ref 70–179)
GLUCOSE RANDOM: 217 mg/dL — ABNORMAL HIGH (ref 70–179)
POTASSIUM: 4.6 mmol/L (ref 3.4–4.8)
POTASSIUM: 5 mmol/L — ABNORMAL HIGH (ref 3.4–4.8)
SODIUM: 138 mmol/L (ref 135–145)
SODIUM: 138 mmol/L (ref 135–145)

## 2021-11-14 LAB — B-TYPE NATRIURETIC PEPTIDE: B-TYPE NATRIURETIC PEPTIDE: 300.97 pg/mL — ABNORMAL HIGH (ref <=100)

## 2021-11-14 NOTE — Unmapped (Signed)
Administered pfizer booster I L deltoid pt tolerated in j well. Had pt wait 15 min.

## 2021-11-14 NOTE — Unmapped (Signed)
[facility]  Pulmonary Hypertension Program                        H. Sherre Lain, MD--Director (Pulmonary)  Denice Bors. [name], MD--Associate Director (Pulmonary)  Freeman Caldron, MD (Cardiology)  Bonney Leitz, MD (Pulmonary)    [name], RN Nurse Coordinator  Claretha Cooper, RN Nurse Coordinator      [phone] office  [phone] fax            [name]  is a 80 y.o. female patient referred for work up and evaluation for pulmonary hypertension, COPD, and chronic hypoxemic respiratory failure.    Assessment:      Barbara Huber is a 80 y.o.female with COPD and chronic hypoxemic respiratory failure. She was previously referred to me for pulmonary hypertension, though this is now clearly dominant left heart/group 2 pathology (with small precapillary component, either secondary to [name] versus COPD).  ??  Overall doing well over the last several months, no COPD or decompensated heart failure related admissions. Overall she is benefiting from current inhaler plan (non-DPI focused), as well as daytime oxygen/nocturnal NIPPV, and heart failure care. We did also discuss PT/pulm rehab a bit today, will keep in mind for future but electing to hold off for now. Given stability we discussed to deescalate inhaler regimen with stop of steroid inhaler in setting of rare exacerbations, but I also think it is reasonable to continue a regimen that has overall worked well for her.      Plan:         - Continue non-DPI inhaler plan (Spiriva Respimat and Advair HFA) for improved drug delivery of COPD therapies in setting of impaired lung function. Albuterol PRN. Can consider stopping ICS (change to Stiolto) at any time if patient interested, or alternatively a change to [place].  - We discussed concerns re: tremor or muscle twitches (what she is describing seems more consistent with latter), I am doubtful that LABA is a significant cause but it would be safe to hold Advair for a week and see if this makes a difference, resuming if there is no change.  - Continue Trilogy plus oxygen (4LPM) at night unchanged. I had previously discussed testing options with sleep lab, if this would be needed to adjust settings and/or qualify patient for higher flow oxygen concentrator (above 5LPM). She would be able to have sleep study using her Trilogy/AVAPS settings, would just require specific planning to also include RT presence, and patient would need to bring home Trilogy with her. Will hold off on this for now given improvements.  - Discussed formal PR at any point in the future, she will think about this more (after the holidays).  - Due for labs today, will also check BNP as has CHF followup next week. Diuretic dosing as per Dr. Wonda Cheng and heart failure team.  - COVID booster given today.  ??  ??  Followup in 6 months.       Barbara Byars, MD  11/14/21  Subjective:        80 y.o. 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 Meredeth Ide. Recalls COPD diagnosis dating back more than 10 years ago, no history of asthma or other lung conditions. Overall it sounds like she has been maintained on triple inhaler therapy (Advair diskus, Spiriva handihaler), with exacerbations averaging maybe once/year, usually not requiring hospitalization. Albuterol PRN via MDI (tries to use BID by routine) or neb (rare use). In 06/2019 she was admitted to [facility] with left sided pneumonia, also treated for COPD exacerbation with steroids during this, course also complicated by AKI and Afib/RVR. Hypercapnic during admission and started on Trilogy for discharge. She also carries diagnosis of CHF, made a few years ago, at least 2 admissions (usually [place]) for diuresis. With recent admission did have very elevated proBNPs and discharged on higher dose of Lasix (40 mg daily). Denies recent edema on this higher dose of diuretic. No orthopnea. She has not been doing a lot of physical activity during COVID, will notice very mild dyspnea with self care, no exertional CP or LH. Does find neuropathy can hinder her activity as well. Occasional cough when not having exacerbation, clear sputum. Did have hemoptysis with pneumonia, none outside of this. With first visit we discussed recommendation to repeat CXR, and also repeat TTE in stable outpatient state.    Subsequent history: At visit 10/2019 doing okay, occasional episodes of DOE but overall happy with level of function. Discussed potential for clinical trial enrollment, but she was feeling very well and did not want to do invasive testing unless changing. Admitted to [facility] 04/2020 for dyspnea and decompensated heart failure, diuresed and also given IV iron. At followup 06/2020 not quite back at baseline, but improving steadily. She was then admitted 07/2020 again for heart failure, and then again in 09/2020 for two weeks with decompensated heart failure, Afib/RVR, septic shock, and gangrenous cholecystitis. Shortly thereafter she was diagnosed with ATTR cardiac amyloid. Admitted 12/2020 with dyspnea and decompensated heart failure, diuresed and underwent DCCV. At followup that month slowing improving since discharge, though more hypoxemic than baseline, discussed anemia/iron and changed inhalers to non-DPI formulations. At followup 01/2021 feeling much better, more active, started tafamidis, no additional changes made. At followup 04/2021 doing well, spirometry improved, no changes made. At followup 10/2021 doing okay, no changes.    Interval history 10/2021: Since last visit  Overall doing okay.   Short hospital stay/ED visits only since last visit.  Incidental COVID in 05/2021.  Treated with infusions for Fe deficiency.  More issues with [name] recently.  Some dyspnea with activity.  Coughing/sputum occasionally.  No COPD flares or antibiotic needs.  Continues on Advair and Spiriva. No albuterol needs.  Bothered by tremor, like involuntary twitches especially of hands.  Torsemide 40 then 20 every day. Fluid/edema okay.  No changes to tafamadis.         Past medical, past surgical, family, and social histories reviewed and updated in Epic.    Review of systems is significant for:   A 12 point review of systems was negative except for pertinent items noted in the HPI.    Allergies   Allergen Reactions   ??? Nitrofurantoin Anaphylaxis and Other (See Comments)   ???  Lipitor [Atorvastatin] Muscle Pain     SOB, Headache, fatigue, sick on my stomach      Current Outpatient Medications   Medication Sig Dispense Refill   ??? ACCU-CHEK AVIVA PLUS TEST STRP Strp UESE TO CHECK BLOOD SUGAR 3 TIMES A DAY BEFORE MEALS 100 each 11   ??? acetaminophen (TYLENOL) 500 MG tablet Take 1,000 mg by mouth daily as needed for pain.      ??? albuterol HFA 90 mcg/actuation inhaler Inhale 2 puffs every eight (8) hours as needed for wheezing.     ??? apixaban (ELIQUIS) 5 mg Tab Take 0.5 tablets (2.5 mg total) by mouth Two (2) times a day. 60 tablet 11   ??? estradioL (ESTRACE) 0.01 % (0.1 mg/gram) vaginal cream INSERT ONE GRAM VAGINALLY AT BEDTIME     ??? fluticasone propion-salmeteroL (ADVAIR HFA) 115-21 mcg/actuation inhaler Inhale 2 puffs Two (2) times a day. 12 g 11   ??? fosfomycin (MONUROL) 3 gram Pack Take 3 g by mouth once a week. 36 g 0   ??? lancets Misc 1 each by Miscellaneous route daily. Accu Check 100 each 6   ??? montelukast (SINGULAIR) 10 mg tablet Take 1 tablet (10 mg total) by mouth daily. 90 tablet 3   ??? NARCAN 4 mg/actuation nasal spray 1 spray into alternating nostrils once as needed (opioid overdose). PRN - Emergency use.     ??? NON FORMULARY APPLY FROM NECK DOWN TWICE A DAY     ??? oxyCODONE (OXYCONTIN) 10 mg TR12 12 hr crush resistant ER/CR tablet Take 10 mg by mouth every twelve (12) hours.     ??? OXYGEN-AIR DELIVERY SYSTEMS MISC 5 L by Miscellaneous route. 3 L/min via Lilly     ??? pantoprazole (PROTONIX) 20 MG tablet Take 1 tablet (20 mg total) by mouth in the morning. 60 tablet 5   ??? rosuvastatin (CRESTOR) 5 MG tablet Take 1 tablet (5 mg total) by mouth every other day. 15 tablet 11   ??? spironolactone (ALDACTONE) 25 MG tablet Take 25 mg by mouth in the morning.     ??? tafamidis 61 mg cap Take 1 capsule (61 mg) by mouth daily. 30 capsule 11   ??? tiotropium bromide (SPIRIVA RESPIMAT) 2.5 mcg/actuation inhalation mist Inhale 2 puffs daily. 4 g 11   ??? varicella-zoster gE-[id number], PF, (SHINGRIX, PF,) 50 mcg/0.5 mL SusR injection Inject 0.5 mL into the muscle. 0.5 mL 1   ??? amiodarone (PACERONE) 200 MG tablet Take 1 tablet (200 mg total) by mouth daily. 30 tablet 1   ??? FEROSUL 325 mg (65 mg iron) tablet TAKE ONE TABLET BY MOUTH EVERY MONDAY, WEDNESDAY & FRIDAY (Patient not taking: Reported on 10/29/2021)     ??? gabapentin (NEURONTIN) 300 MG capsule Take 1 capsule (300 mg total) by mouth at bedtime. 90 capsule 3   ??? metOLazone (ZAROXOLYN) 5 MG tablet Take 1 tablet (5 mg total) by mouth as needed in the morning (when instructed by cardiology clinic). 12 tablet 0   ??? torsemide (DEMADEX) 20 MG tablet Take 1 tablet (20 mg total) by mouth 3 (three) times a day. May also take 1 tablet (20 mg total) daily as needed (weight gain). 60 tablet 11     No current facility-administered medications for this visit.   f        Objective:   Objective     Physical Exam:   Temp 36.4 ??C (97.6 ??F) (Temporal)  - Ht 167.6 cm (5' 6)  - Wt 98.9 kg (  218 lb)  - BMI 35.19 kg/m??   Body mass index is 35.19 kg/m??.    Wt Readings from Last 6 Encounters:   11/14/21 98.9 kg (218 lb)   10/29/21 98.9 kg (218 lb)   10/23/21 98.9 kg (218 lb)   09/17/21 98 kg (216 lb)   08/29/21 99.8 kg (220 lb)   08/27/21 (!) 100.8 kg (222 lb 3.2 oz)     General Appearance:    Alert, cooperative, no distress, appears stated age.   HEENT:    Normocephalic, without obvious abnormality, atraumatic. PERRL, conjunctiva clear. Nares normal, no drainage, no sinus tenderness. Oropharynx with no lesions. No facial telangectasias.   Neck:    Supple, trachea midline, no adenopathy.    Lungs:    Distant breath sounds b/l, no crackles or wheezes, respirations unlabored. No PA bruits.   Chest wall:   Expands symmetrically, no tenderness or deformity.   Cardiovascular:   Regular rate and rhythm, S1 and S2 normal, P2 not overly prominent, no murmur, rubs, or gallop appreciated. Neck exam with prominent carotid impulse, JVD not clearly visible when upright.   Abdomen:   Soft, non-tender, normal bowel sounds, no masses, no organomegaly   Extremities:     Extremities normal, no cyanosis, clubbing. Trace pitting edema. No sclerodactyly.   Skin:   No rashes or lesions.   Neuro/psych:      No focal neurologic deficits; mood/affect normal..     Diagnostic Review:    Cardiac testing summary:  TTE 04/24/21:   RHC 01/09/21: RA 23, PA 85/40 (55), PCW 32       CO/CI 5.3/2.5 (f,td), PA sat 57%, PVR 4.2 WU  TTE 09/2020: LA mild dil, EF >55%       RV ULN size, HK, tr TR, can't est PASP       No effusion. IVC dil, poor insp collapse  TTE 04/2020: LA 41, EF >55%, LVH (16)       RV normal, mild HK, TAPSE 25, can't est PASP       No effusion. IVC normal.  TTE 10/2019: LA 42, EF >55%       RV mild dil/HK, can't est PASP       No effusion. IVC dil, poor insp collapse  TTE 06/2019: LA 38, EF 65-70%, grade 2 DD, LVH (13-14)       RV mild dil, low nl fxn, TAPSE 17, mild-mod Tr, est PASP 64+CVP       Tr effusion. IVC nl size, poor insp collapse  TTE 04/2018 (cone): LA 40, EF 60-65%, grade 1 DD       RV normal, mild TR, est PASP within the normal range       No effusion. IVC normal    CXR 08/2020: cardiomegaly       Pulmonary vascular congestion with increased markings       Hyperinflated lungs, flat diagphragms  CXR 04/2020: cardiomegaly       Hazy interstitial opacities with vascular congestion       Likely trace L effusion.  CXR 08/2019: ??stable cardiomegaly       Interval resolution of LLL consolidation.       Stable parenchymal scarring in the left midlung zone.       Interval decreased now trace left pleural effusion.    CTA chest 10/2018 (cone): Cardiovascular: There is no demonstrable pulmonary embolus. There is  no thoracic aortic aneurysm or dissection. There is calcification at  the origin of the left  subclavian artery. Other visualized great  vessels appear unremarkable. There is aortic atherosclerosis. There  are foci of coronary artery calcification evident. There is a small  amount of pericardial fluid, within the physiologic range, stable.  Pericardium does not appear appreciably thickened.  Prominence of the main pulmonary outflow tract is noted with a  measured diameter of 3.7 cm.  Mediastinum/Nodes: There is a subcentimeter nodular opacity in the  left lobe of the thyroid, stable. There are multiple subcentimeter  mediastinal lymph nodes again noted. There are prominent right  paratracheal lymph nodes with largest lymph node in this area  measuring 1.8 x 1.4 cm, marginally smaller than on prior study.  Aortopulmonary window lymph nodes appear slightly smaller than on  previous study. No new lymph node prominence is evident on this  study. No esophageal lesions are evident.  Lungs/Pleura: There is atelectatic change in the lung base regions,  stable. There is no frank edema or consolidation. A mild degree of  centrilobular emphysematous change appears stable. No appreciable  pleural effusion.  Upper Abdomen: There is a cyst in the medial upper pole right kidney  measuring 1.5 x 1.5 cm. A cyst is noted in the lateral mid left  kidney measuring 1.5 x 1.3 cm. There is aortic atherosclerosis as  well as calcification in the proximal visualized great vessels.  Visualized upper abdominal structures otherwise appear unremarkable.  Musculoskeletal: There is stable anterior wedging of the L1  vertebral body. There is multilevel degenerative change in the  thoracic spine which appear stable. No blastic or lytic bone lesions  are evident. No evident chest wall lesions.     CT chest 04/2018 (cone): Cardiovascular: Mild cardiomegaly. Small pericardial  effusion/thickening. Left anterior descending and right coronary  atherosclerosis. Atherosclerotic nonaneurysmal thoracic aorta.  Dilated main pulmonary artery (3.6 cm diameter). No central  pulmonary emboli.  Mediastinum/Nodes: Subcentimeter hypodense posterior left thyroid  lobe nodule. Unremarkable esophagus. No axillary adenopathy.  Enlarged right paratracheal nodes up to 1.6 cm (series 2/image 54).  Enlarged 1.1 cm AP window node (series 2/image 51). No  pathologically enlarged hilar nodes.  Lungs/Pleura: No pneumothorax. Trace dependent left pleural  effusion. No right pleural effusion. No acute consolidative airspace  disease, lung masses or significant pulmonary nodules. Minimal  interlobular septal thickening throughout both lungs. Several mildly  thickened parenchymal bands in the dependent right middle lobe,  lingula and right lower lobe. No significant regions of  bronchiectasis. Mild centrilobular emphysema with mild diffuse  bronchial wall thickening.  Upper abdomen: Partially visualized simple 1.6 cm medial upper right  renal cyst. Mild scarring in the visualized upper right kidney.  Musculoskeletal: No aggressive appearing focal osseous lesions.  Stable mild chronic L1 vertebral compression fracture. Moderate  thoracic spondylosis.     V/Q scan : none    08/2019: 152 meters. HR 79->101, O2 sat 91->88% on 3LPM, needing 4LPM. Max Borg 2.    Pulmonary Function Testing:  Date FEV1 FVC TLC FRC RV DLCO    04/2021  1.12 (66%)  1.91 (86%)        08/2019  0.99 (56%)  1.83 (79%)     5.0 (26%)    04/2018  0.98 (59%)  1.88 (89%)  68%   63%   (29%)                Overnight oximetry 01/2021 (NIPPV/5L): 48 min <89%, avg 90%, nadir 81%, ODI 15  Overnight oximetry 08/2019: 20 min <89% (total time 6.5 hours), avg  90%, nadir 94%       Compliance data with very good usage  PSG : done in past but not available     Relevant Serologies:   Labs 08/2019: ANA pos 1:160 homogenous, ENA neg, RF/CCP neg  Labs 12/2018: ANCA neg, ACE 31    Routine Labs:     ??  Date NTproBNP Creatinine  Potassium  Bicarbonate  Hemoglobin  abs eos                      07/2021 BNP 445 2.6   30  9.7  300    03/2021 BNP 567 1.5 4.1  31  8.2  200    12/2020 BNP 926->479 1.3 3.8  33  8.9 ??   11/2020  3800   BNP 567 ?? ?? ?? ?? ??    07/2020  BNP 603 ?? ?? ?? ?? ??    05/2020  1970  1.3  4.3  32  11.2 ??   10/2019  2220  1.1 ??  34 ?? ??    08/2019  1410 ?? ?? ?? ?? ??    07/2019  1270  0.9  4.1  29  10.0     06/2019  6600->13K ?? ?? ?? ?? ??   11/2018  BNP 253 ?? ?? ?? ?? ??    08/2018  BNP 531

## 2021-11-14 NOTE — Unmapped (Signed)
Thank you for visiting the [facility] Pulmonary Hypertension Clinic.    If you have any questions or concerns, please call     Haze Justin, MD  [name], RN, Pulmonary Hypertension Nurse Coordinator  Claretha Cooper, RN, Pulmonary Hypertension Nurse Coordinator    [phone].           For the tremor, try stopping Advair for one week to see if this makes a difference.  Labs today.

## 2021-11-17 ENCOUNTER — Ambulatory Visit: Admit: 2021-11-17 | Discharge: 2021-11-18 | Payer: MEDICARE | Attending: Adult Health | Primary: Adult Health

## 2021-11-17 DIAGNOSIS — I43 Cardiomyopathy in diseases classified elsewhere: Principal | ICD-10-CM

## 2021-11-17 DIAGNOSIS — I5032 Chronic diastolic (congestive) heart failure: Principal | ICD-10-CM

## 2021-11-17 DIAGNOSIS — E854 Organ-limited amyloidosis: Principal | ICD-10-CM

## 2021-11-17 DIAGNOSIS — I4819 Other persistent atrial fibrillation: Principal | ICD-10-CM

## 2021-11-17 DIAGNOSIS — J410 Simple chronic bronchitis: Principal | ICD-10-CM

## 2021-11-17 DIAGNOSIS — I10 Essential (primary) hypertension: Principal | ICD-10-CM

## 2021-11-17 MED ORDER — METOLAZONE 5 MG TABLET
ORAL_TABLET | Freq: Every day | ORAL | 1 refills | 30 days | Status: CP | PRN
Start: 2021-11-17 — End: 2021-12-17

## 2021-11-17 MED ORDER — AMIODARONE 200 MG TABLET
ORAL_TABLET | Freq: Every day | ORAL | 3 refills | 90 days | Status: CP
Start: 2021-11-17 — End: 2022-11-17

## 2021-11-17 NOTE — Unmapped (Addendum)
Today,    MEDICATIONS:  We are changing your medications today.  - Try reducing amiodarone to 100mg  a day (1/2 a pill)  - Take extra torsemide 20mg  until weight comes back down. If it doesn't work, can take the booster.   Call if you have questions about your medications.    LABS:  None today    NEXT APPOINTMENT:  Return to clinic in 2 months with  Dr Wonda Cheng      In general, to take care of your heart failure:  -Limit your fluid intake to 2 Liters (half-gallon) per day.    -Limit your salt intake to ideally 2-3 grams (2000-3000 mg) per day.  -Weigh yourself daily and record, and bring that weight diary to your next appointment.  (Weight gain of 2-3 pounds in 1 day typically means fluid weight.)    The medications for your heart are to help your heart and help you live longer.    Please contact us before stopping any of your heart medications.    Call the clinic at [phone] with questions.  Our clinic fax number is [phone].  If you need to reschedule future appointments, please call [phone] or [phone]  My office number (c/o De Burrs RN) is [phone] if you need further assistance.  After office hours, if you have urgent questions/problems, contact the on-call cardiologist through the hospital operator: [phone].    To learn more about heart failure, please read [facility]'s Learning to Live with Heart Failure:  [email or web address].    Available online at   [email or web address] - then click the link Learning to Live with Heart Failure patient booklet    OR    [email or web address] - open the window for Medical Management and click the link Living with Heart Failure

## 2021-11-17 NOTE — Unmapped (Signed)
[facility] HF Amyloid Clinic Note    Referring Provider: Artelia Laroche, MD  [address]  [address]  [place],  [place] [address]   Primary Provider: Jacquiline Doe, MD  [address]  [place] [address]   Other Providers:  Dr Luretha Murphy    Reason for Visit:  Barbara Huber is a 80 y.o. female being seen for hospital follow up.    Assessment & Plan:  1. 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   - Moderately increased wall thickness with low voltage ECG - she's had issues with hypotension on ARB and BB, as well as atrial arrhythmias. SPEP/UPEP/free light chains without concern. 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 She has one daughter who accompanies her and is informed.   - DC'd Jardiance 10mg  daily due to recurrent UTIs  - Continue torsemide 40 mg in the morning and 20 in the afternoon; take extra 20mg  until weight is back to baseline. Continue metolazone PRN if no improvement w/ extra torsemide.  - Continue Tafamidis 61 mg daily   - Continue spironolactone 50 mg daily     2. Persistent atrial fibrillation  - CHA2DS2-VASc score = 5 (1-gender, 2-Age, 1-DM, 1-HTN)  - Anticoagulated with Eliquis 5mg  BID  - She had been in afib since at least 07/2020; now s/p cardioversion 01/10/21 and continues to be in NSR since  - Reduce amiodarone to 100mg  daily - having bothersome tremors/jerking    3. COPD  - home O2 3L [place] - followed by pulm  - on Advair and Spiriva    4. Iron deficiency anemia  - s/p 5 IV iron sessions     Follow-up:  Return in about 2 months (around 01/17/2022) for already scheduled appointment.    History of Present Illness:  Barbara Huber is a 80 y.o. female with PMHx of COPD on 3L home [place], CHF, [name], [name], T2DM??who presents today for hospital follow-up. She presented to [facility]??with Acute on Chronic Decompensated Diastolic CHF in the setting of Septic Shock d/t Gangrenous Cholecystitis now s/p cholecystectomy 10/6. She presented with 1 week of dyspnea on exertion, leg swelling and abdominal distention without chest pain. Her dry weight was 232 pounds,??238 pounds. Upon arrival to [facility], she was in afib with RVR with higher rates and softer pressures requiring NE and addition of vaso to maintain MAP>65. She was given digoxin x 1 for better rate control with mild improvement in rates but was still requiring high doses of NE as well as vaso.??Dobutamine given to augment diuresis via bumex drip and metolazone x1 dose. Etiology of pressor requirement likely due to septic shock. Echo 10/4 showed upper-normal RV size with??reduced systolic function.??RHC to evaluate for RV dysfunction unable to be completed due to arterial placement of micropuncture sheath during procedure. She was weaned off of pressors and continued to improve with diuresis and treatment of underlying infection. At time of discharge, her acute Diastolic HF was controlled and she was restarted on home meds. For her afib, coreg was held during admission but then restarted prior to discharge.     She as seen in hospital follow-up 10/30/20. She was discharged on Lasix 40mg  BID. Her weight was 221lbs on discharge. Lasix was reduced to 20mg  daily by her PCP for low SBPs (although  notes say 40mg  daily). Since reducing the Lasix, her weight crept up to 226lbs. Her belly felt  tight and she had LE edema.  At visit with PCP, her Coreg was also changed to metoprolol 25mg  and irbesartan was stopped. BPs were running 110s/70-90s. HR running around 100-110. Her metoprolol was increased to 50mg  for rate control. Lasix was increased to 40mg  daily.    She was seen 11/18 and her weight was been running 224-227lbs. She has low energy and since increasing metoprolol she notes worsening of her symptoms. She feels bloated and has LE edema. She is not back to her usual activities, like fixing her food or cooking. Her BP is running 100-120s/70-80s.     PYP scan showed TTR amyloid. She then saw Dr Luretha Murphy 12/3 and was feeling ok but somewhat volume up.   We met her on 1/7. Metoprolol was decreased to 50mg  daily and she was started on amiodarone. She unfortunately was hospitalized 1/11-1/15 for 3 days of worsening shortness of breath and fatigue.  She was found to have AKI and oliguria in the setting of increased beta blocker as an outpatient resulting in decreased cardiac filling/output. She was treated with gentle diuresis and removal of beta blockade with subsequent improvement in blood pressure and renal function. She underwent RHC with showed elevated filling pressures, normal CO, and restrictive filling pattern. She had a DCCV 1/14 with resolution of atrial fibrillation. She was continued on amiodarone.    As of 03/21/21, she has been feeling well. Her weight has been stable on 40 mg torsemide BID. She has not had any more UTIs and her DOE has been back to when she was feeling well. She has more energy and appetite is a bit better too. Her breathing and energy are good. Her HR has been very good and so has her BP.    Since her last visit in clinic 03/21/21,  she has had multiple admissions: 4/22 for weakness/urosepsis, 4/27 for shortness of breath/orthopnea/weight gain, 6/7 for lightheadedness/SOB/Blurry vision found to be anemic. She also had two recent [name] 6/17 for LE edema and dyspnea on exertion in the setting of recently being taken off her torsemide and spironolactone, and again 6/22 for anemia with Hgb down to 7.5. As of 6/24, she states her energy is a little better and she has more of an appetite. She has no swelling or SOB. She is sleeping fine at night without orthopnea/PND. She is taking torsemide daily still rather than every other day.     She was last seen 9/2 for follow-up. She had more swelling in her legs so torsemide was increased to 40mg  qAM, 20mg  qPM.     Interval History  She presents today for follow-up, accompanied by her daugher. She has been feeling well over all. She is not having a lot of issues with volume retention. Weight remains 216-218lbs. It was up to 221lbs today after receiving a COVID booster on Friday. She plans to take extra torsemide this afternoon. She has a bit of sock line edema. She hasnt taken metolazone in 2 weeks. She has good energy, is cooking for herself a bit. She is having bothersome jerking/tremors in her arms/hands. She is thinking it's caused by a medicine, wondering if it's the amiodarone.     Cardiovascular History & Procedures:    Cath / PCI:  ?? none    CV Surgery:  ??  none    EP Procedures and Devices:  ?? none    Non-Invasive  Evaluation(s):    Echo:  04/24/21   1. The left ventricle is normal in size with mildly increased wall  thickness.    2. The left ventricular systolic function is normal, LVEF is visually  estimated at > 55%.    3. There is grade II diastolic dysfunction (elevated filling pressure).    4. Mitral annular calcification is present (mild).    5. The left atrium is mildly dilated in size.    6. The right ventricle is mildly dilated in size, with low normal systolic  function.    7. IVC size and inspiratory change suggest elevated right atrial pressure.  (10-20 mmHg).    ?? 09/30/20  Summary    1. Limited study to assess systolic function.    2. IVC size and inspiratory change suggest normal right atrial pressure.  (0-5 mmHg).    3. The left ventricle is normal in size with mildly to moderately increased  wall thickness.    4. The left ventricular systolic function is normal with no obvious wall  motion abnormalities, LVEF is visually estimated at > 55%.    5. Mitral annular calcification is present.    6. The left atrium is mildly dilated in size.    7. The right ventricle is upper normal in size, with reduced systolic  Function.    Cardiac CT/MRI/Nuclear Tests:  ??  None    6 Minute Walk:  ?? None    Cardiopulmonary Stress Tests:  ?? None               Other Past Medical History:  See below for the complete EPIC list of past medical and surgical history.      Allergies:  Nitrofurantoin and Lipitor [atorvastatin]    Current Medications:  Current Outpatient Medications   Medication Sig Dispense Refill   ??? ACCU-CHEK AVIVA PLUS TEST STRP Strp UESE TO CHECK BLOOD SUGAR 3 TIMES A DAY BEFORE MEALS 100 each 11   ??? acetaminophen (TYLENOL) 500 MG tablet Take 1,000 mg by mouth daily as needed for pain.      ??? albuterol HFA 90 mcg/actuation inhaler Inhale 2 puffs every eight (8) hours as needed for wheezing.     ??? apixaban (ELIQUIS) 5 mg Tab Take 0.5 tablets (2.5 mg total) by mouth Two (2) times a day. 60 tablet 11   ??? estradioL (ESTRACE) 0.01 % (0.1 mg/gram) vaginal cream INSERT ONE GRAM VAGINALLY AT BEDTIME     ??? FEROSUL 325 mg (65 mg iron) tablet      ??? fluticasone propion-salmeteroL (ADVAIR HFA) 115-21 mcg/actuation inhaler Inhale 2 puffs Two (2) times a day. 12 g 11   ??? fosfomycin (MONUROL) 3 gram Pack Take 3 g by mouth once a week. 36 g 0   ??? lancets Misc 1 each by Miscellaneous route daily. Accu Check 100 each 6   ??? montelukast (SINGULAIR) 10 mg tablet Take 1 tablet (10 mg total) by mouth daily. 90 tablet 3   ??? NARCAN 4 mg/actuation nasal spray 1 spray into alternating nostrils once as needed (opioid overdose). PRN - Emergency use.     ??? NON FORMULARY APPLY FROM NECK DOWN TWICE A DAY     ??? oxyCODONE (OXYCONTIN) 10 mg TR12 12 hr crush resistant ER/CR tablet Take 10 mg by mouth every twelve (12) hours.     ??? OXYGEN-AIR DELIVERY SYSTEMS MISC 5 L by Miscellaneous route. 3 L/min via [place]     ??? pantoprazole (PROTONIX) 20 MG tablet  Take 1 tablet (20 mg total) by mouth in the morning. 60 tablet 5   ??? rosuvastatin (CRESTOR) 5 MG tablet Take 1 tablet (5 mg total) by mouth every other day. 15 tablet 11   ??? spironolactone (ALDACTONE) 25 MG tablet Take 25 mg by mouth in the morning.     ??? tafamidis 61 mg cap Take 1 capsule (61 mg) by mouth daily. 30 capsule 11   ??? tiotropium bromide (SPIRIVA RESPIMAT) 2.5 mcg/actuation inhalation mist Inhale 2 puffs daily. 4 g 11   ??? varicella-zoster gE-AS01B, PF, (SHINGRIX, PF,) 50 mcg/0.5 mL SusR injection Inject 0.5 mL into the muscle. 0.5 mL 1   ??? amiodarone (PACERONE) 200 MG tablet Take 0.5 tablets (100 mg total) by mouth daily. 45 tablet 3   ??? gabapentin (NEURONTIN) 300 MG capsule Take 1 capsule (300 mg total) by mouth at bedtime. 90 capsule 3   ??? metOLazone (ZAROXOLYN) 5 MG tablet Take 1 tablet (5 mg total) by mouth daily as needed (when instructed by cardiology clinic). 30 tablet 1   ??? torsemide (DEMADEX) 20 MG tablet Take 1 tablet (20 mg total) by mouth 3 (three) times a day. May also take 1 tablet (20 mg total) daily as needed (weight gain). 60 tablet 11     No current facility-administered medications for this visit.       Family History:  The patient's family history includes Cancer in her father; Hypertension in her mother.    Social history:  She  reports that she quit smoking about 3 years ago. Her smoking use included cigarettes. She smoked an average of .33 packs per day. She has never used smokeless tobacco. She reports that she does not drink alcohol and does not use drugs.    Review of Systems:  As per HPI.  Rest of the review of ten systems is negative or unremarkable except as stated above.    Physical Exam:  VITAL SIGNS:   Vitals:    11/17/21 1023   BP: 104/48   Pulse: 65      Wt Readings from Last 3 Encounters:   11/17/21 100.2 kg (221 lb)   11/14/21 98.9 kg (218 lb)   10/29/21 98.9 kg (218 lb)      Today's Body mass index is 35.67 kg/m??.   Height: 167.6 cm (5' 6)  CONSTITUTIONAL: chronically ill-appearing in no acute distress  EYES: Conjunctivae and sclerae clear and anicteric.  ENT: Benign.   CARDIOVASCULAR: JVP not seen above the clavicle with HOB at 90 degrees. Rate and rhythm are regular.  There is no lifts or heaves.  Normal S1, S2. There is no murmur, gallops or rubs.  Radial and pedal pulses are 2+, bilaterally.   There is trace pedal/pretibial edema, right greater than left.   RESPIRATORY: Normal respiratory effort. Clear to auscultation bilaterally..  There are no wheezes.  GASTROINTESTINAL: Soft, non-tender, with audible bowel sounds. Abdomen nondistended.  Liver is nonpalpable.  SKIN: No rashes, ecchymosis or petechiae.  Warm, well perfused.   MUSCULOSKELETAL:  no joint swelling   NEURO/PSYCH: Appropriate mood and affect. Alert and oriented to person, place, and time. No gross motor or sensory deficits evident.    Pertinent Laboratory Studies:   Office Visit on 11/14/2021   Component Date Value Ref Range Status   ??? Sodium 11/14/2021 138  135 - 145 mmol/L Final   ??? Potassium 11/14/2021 4.6  3.4 - 4.8 mmol/L Final   ??? Chloride 11/14/2021 97 (L)  98 - 107  mmol/L Final   ??? CO2 11/14/2021 33.2 (H)  20.0 - 31.0 mmol/L Final   ??? Anion Gap 11/14/2021 8  5 - 14 mmol/L Final   ??? BUN 11/14/2021 61 (H)  9 - 23 mg/dL Final   ??? Creatinine 11/14/2021 2.73 (H)  0.60 - 0.80 mg/dL Final   ??? BUN/Creatinine Ratio 11/14/2021 22   Final   ??? eGFR [name]-EPI (2021) Female 11/14/2021 17 (L)  >=60 mL/min/1.70m2 Final   ??? Glucose 11/14/2021 214 (H)  70 - 179 mg/dL Final   ??? Calcium 16/09/9603 9.0  8.7 - 10.4 mg/dL Final   ??? BNP 11/14/2021 300.97 (H)  <=100 pg/mL Final   ??? Sodium 11/14/2021 138  135 - 145 mmol/L Final   ??? Potassium 11/14/2021 5.0 (H)  3.4 - 4.8 mmol/L Final   ??? Chloride 11/14/2021 96 (L)  98 - 107 mmol/L Final   ??? CO2 11/14/2021 33.2 (H)  20.0 - 31.0 mmol/L Final   ??? Anion Gap 11/14/2021 9  5 - 14 mmol/L Final   ??? BUN 11/14/2021 63 (H)  9 - 23 mg/dL Final   ??? Creatinine 11/14/2021 2.77 (H)  0.60 - 0.80 mg/dL Final   ??? BUN/Creatinine Ratio 11/14/2021 23   Final   ??? eGFR [name]-EPI (2021) Female 11/14/2021 17 (L)  >=60 mL/min/1.46m2 Final   ??? Glucose 11/14/2021 217 (H)  70 - 179 mg/dL Final   ??? Calcium 54/08/8118 9.1  8.7 - 10.4 mg/dL Final   ??? WBC 14/78/2956 8.0  3.6 - 11.2 10*9/L Final   ??? RBC 11/14/2021 3.87 (L)  3.95 - 5.13 10*12/L Final   ??? HGB 11/14/2021 9.6 (L)  11.3 - 14.9 g/dL Final   ??? HCT 21/30/8657 31.2 (L)  34.0 - 44.0 % Final   ??? MCV 11/14/2021 80.5  77.6 - 95.7 fL Final   ??? MCH 11/14/2021 24.9 (L)  25.9 - 32.4 pg Final   ??? MCHC 11/14/2021 30.9 (L)  32.0 - 36.0 g/dL Final   ??? RDW 84/69/6295 17.6 (H)  12.2 - 15.2 % Final   ??? MPV 11/14/2021 8.1  6.8 - 10.7 fL Final   ??? Platelet 11/14/2021 274  150 - 450 10*9/L Final   ??? nRBC 11/14/2021 0  <=4 /100 WBCs Final   ??? Neutrophils % 11/14/2021 77.2  % Final   ??? Lymphocytes % 11/14/2021 10.2  % Final   ??? Monocytes % 11/14/2021 8.6  % Final   ??? Eosinophils % 11/14/2021 2.5  % Final   ??? Basophils % 11/14/2021 1.5  % Final   ??? Absolute Neutrophils 11/14/2021 6.2  1.8 - 7.8 10*9/L Final   ??? Absolute Lymphocytes 11/14/2021 0.8 (L)  1.1 - 3.6 10*9/L Final   ??? Absolute Monocytes 11/14/2021 0.7  0.3 - 0.8 10*9/L Final   ??? Absolute Eosinophils 11/14/2021 0.2  0.0 - 0.5 10*9/L Final   ??? Absolute Basophils 11/14/2021 0.1  0.0 - 0.1 10*9/L Final   ??? Anisocytosis 11/14/2021 Slight (A)  Not Present Final   Office Visit on 10/23/2021   Component Date Value Ref Range Status   ??? Creat U 10/23/2021 44.6  Undefined mg/dL Final   ??? Protein, Ur 10/23/2021 <6.0  Undefined mg/dL Final   ??? Protein/Creatinine Ratio, Urine 10/23/2021    Final   ??? Creat U 10/23/2021 44.7  Undefined mg/dL Final   ??? Albumin Quantitative, Urine 10/23/2021 <0.3  Undefined mg/dL Final   ??? Albumin/Creatinine Ratio 10/23/2021    Final   ???  Spec Gravity/POC 10/23/2021 1.015  1.003 - 1.030 Final   ??? PH/POC 10/23/2021 6.5  5.0 - 9.0 Final   ??? Leuk Esterase/POC 10/23/2021 Negative  Negative Final   ??? Nitrite/POC 10/23/2021 Negative  Negative Final   ??? Protein/POC 10/23/2021 Negative  Negative Final   ??? UA Glucose/POC 10/23/2021 Negative  Negative Final   ??? Ketones, POC 10/23/2021 Negative  Negative Final   ??? Bilirubin/POC 10/23/2021 Negative  Negative Final   ??? Blood/POC 10/23/2021 Negative  Negative Final   ??? Urobilinogen/POC 10/23/2021 0.2  0.2 - 1.0 mg/dL Final   Office Visit on 08/27/2021   Component Date Value Ref Range Status   ??? HGB A1C, POC 08/27/2021 5.9  <7.0 % Final   ??? EST AVERAGE GLUCOSE, POC 08/27/2021 123  mg/dL Final   ??? Sodium 16/09/9603 139  135 - 145 mmol/L Final   ??? Potassium 08/27/2021 4.5  3.4 - 4.8 mmol/L Final   ??? Chloride 08/27/2021 103  98 - 107 mmol/L Final   ??? CO2 08/27/2021 30.3  20.0 - 31.0 mmol/L Final   ??? Anion Gap 08/27/2021 6  5 - 14 mmol/L Final   ??? BUN 08/27/2021 45 (H)  9 - 23 mg/dL Final   ??? Creatinine 08/27/2021 2.58 (H)  0.60 - 0.80 mg/dL Final   ??? BUN/Creatinine Ratio 08/27/2021 17   Final   ??? eGFR [name]-EPI (2021) Female 08/27/2021 18 (L)  >=60 mL/min/1.62m2 Final   ??? Glucose 08/27/2021 129  70 - 179 mg/dL Final   ??? Calcium 54/08/8118 9.0  8.7 - 10.4 mg/dL Final   ??? BNP 14/78/2956 444.77 (H)  <=100 pg/mL Final   ??? WBC 08/27/2021 6.4  3.6 - 11.2 10*9/L Final   ??? RBC 08/27/2021 3.94 (L)  3.95 - 5.13 10*12/L Final   ??? HGB 08/27/2021 9.7 (L)  11.3 - 14.9 g/dL Final   ??? HCT 21/30/8657 31.6 (L)  34.0 - 44.0 % Final   ??? MCV 08/27/2021 80.2  77.6 - 95.7 fL Final   ??? MCH 08/27/2021 24.7 (L)  25.9 - 32.4 pg Final   ??? MCHC 08/27/2021 30.8 (L)  32.0 - 36.0 g/dL Final   ??? RDW 84/69/6295 19.8 (H)  12.2 - 15.2 % Final   ??? MPV 08/27/2021 8.6  6.8 - 10.7 fL Final   ??? Platelet 08/27/2021 202  150 - 450 10*9/L Final   ??? nRBC 08/27/2021 0  <=4 /100 WBCs Final   ??? Neutrophils % 08/27/2021 69.4  % Final   ??? Lymphocytes % 08/27/2021 16.4  % Final   ??? Monocytes % 08/27/2021 8.5  % Final   ??? Eosinophils % 08/27/2021 4.6  % Final   ??? Basophils % 08/27/2021 1.1  % Final   ??? Absolute Neutrophils 08/27/2021 4.4  1.8 - 7.8 10*9/L Final   ??? Absolute Lymphocytes 08/27/2021 1.0 (L)  1.1 - 3.6 10*9/L Final   ??? Absolute Monocytes 08/27/2021 0.5  0.3 - 0.8 10*9/L Final   ??? Absolute Eosinophils 08/27/2021 0.3  0.0 - 0.5 10*9/L Final   ??? Absolute Basophils 08/27/2021 0.1  0.0 - 0.1 10*9/L Final   ??? Anisocytosis 08/27/2021 Moderate (A) Not Present Final   Procedure visit on 08/15/2021   Component Date Value Ref Range Status   ??? Spec Gravity/POC 08/15/2021 1.020  1.003 - 1.030 Final   ??? PH/POC 08/15/2021 7.0  5.0 - 9.0 Final   ??? Leuk Esterase/POC 08/15/2021 Negative  Negative Final   ??? Nitrite/POC 08/15/2021 Negative  Negative  Final   ??? Protein/POC 08/15/2021 Negative  Negative Final   ??? UA Glucose/POC 08/15/2021 Negative  Negative Final   ??? Ketones, POC 08/15/2021 Negative  Negative Final   ??? Bilirubin/POC 08/15/2021 Negative  Negative Final   ??? Blood/POC 08/15/2021 Negative  Negative Final   ??? Urobilinogen/POC 08/15/2021 0.2  0.2 - 1.0 mg/dL Final       Lab Results   Component Value Date    PRO-BNP 3,420.0 (H) 07/03/2021    PRO-BNP 905.0 (H) 03/21/2021    PRO-BNP 3,796.0 (H) 12/23/2020    Creatinine 2.73 (H) 11/14/2021    Creatinine 2.77 (H) 11/14/2021    Creatinine 0.79 01/06/2011    BUN 61 (H) 11/14/2021    BUN 63 (H) 11/14/2021    BUN 20 01/06/2011    Sodium 138 11/14/2021    Sodium 138 11/14/2021    Sodium 140 01/06/2011    Potassium 4.6 11/14/2021    Potassium 5.0 (H) 11/14/2021    Potassium 4.1 01/06/2011    CO2 33.2 (H) 11/14/2021    CO2 33.2 (H) 11/14/2021    CO2 30 01/06/2011    Magnesium 2.5 07/14/2021    Magnesium 2.0 01/06/2011    Total Bilirubin 0.4 07/03/2021    INR 2.15 07/01/2021    INR 1.0 01/06/2011       Lab Results   Component Value Date    Digoxin Level 0.9 10/01/2020       Lab Results   Component Value Date    TSH 2.122 06/17/2021    Cholesterol 132 08/23/2020    Triglycerides 109 08/23/2020    HDL 32 (L) 08/23/2020    Non-HDL Cholesterol 100 08/23/2020    LDL Calculated 78 08/23/2020       Lab Results   Component Value Date    WBC 8.0 11/14/2021    WBC 12.5 (H) 01/06/2011    HGB 9.6 (L) 11/14/2021    HGB 12.9 01/06/2011    HCT 31.2 (L) 11/14/2021    HCT 40.8 01/06/2011    Platelet 274 11/14/2021    Platelet 260 01/06/2011       Pertinent Test Results from Today:  None    Other pertinent records were reviewed.    The following are further history from the patient's EPIC record for reference:     Past Medical History:   Diagnosis Date   ??? Acute kidney injury superimposed on chronic kidney disease (CMS-HCC) 10/11/2020   ??? Acute on chronic diastolic (congestive) heart failure (CMS-HCC) 08/23/2020   ??? Arthritis    ??? Calculus of kidney    ??? Calculus of ureter    ??? CHF (congestive heart failure) (CMS-HCC)    ??? Chronic atrial fibrillation (CMS-HCC) 07/20/2019   ??? COPD (chronic obstructive pulmonary disease) (CMS-HCC)    ??? Diabetes (CMS-HCC)    ??? Gangrenous cholecystitis 10/11/2020   ??? GERD (gastroesophageal reflux disease)    ??? Hydronephrosis    ??? Hypertension    ??? Hyponatremia 10/11/2020   ??? Intermediate coronary syndrome (CMS-HCC) 03/13/2014   ??? Lower extremity edema 09/28/2020   ??? Lumbar stenosis    ??? Microscopic hematuria    ??? Nausea alone    ??? Nephrolithiasis 04/17/2016   ??? Neuropathy    ??? Nocturia    ??? Other chronic cystitis    ??? Pulmonary hypertension (CMS-HCC)    ??? Renal colic    ??? Sleep apnea    ??? Unstable angina pectoris (CMS-HCC) 03/13/2014       Past  Surgical History:   Procedure Laterality Date   ??? BACK SURGERY  1995   ??? CARPAL TUNNEL RELEASE Left 2014   ??? HYSTERECTOMY  1971   ??? IR INSERT CHOLECYSTOSMY TUBE PERCUTANEOUS  10/02/2020    IR INSERT CHOLECYSTOSMY TUBE PERCUTANEOUS 10/02/2020 Braulio Conte, MD IMG VIR [facility]   ??? LUMBAR DISC SURGERY     ??? PR REMOVAL GALLBLADDER N/A 10/06/2020    Procedure: CHOLECYSTECTOMY;  Surgeon: Katherina Mires, MD;  Location: MAIN OR [facility];  Service: Trauma   ??? PR RIGHT HEART CATH O2 SATURATION & CARDIAC OUTPUT N/A 09/30/2020    Procedure: Right Heart Catheterization;  Surgeon: Neal Dy, MD;  Location: [facility] CATH;  Service: Cardiology   ??? PR RIGHT HEART CATH O2 SATURATION & CARDIAC OUTPUT N/A 01/09/2021    Procedure: Right Heart Catheterization;  Surgeon: Lesle Reek, MD;  Location: [facility] CATH;  Service: Cardiology

## 2021-11-26 MED ORDER — ADVAIR HFA 115 MCG-21 MCG/ACTUATION AEROSOL INHALER
11 refills | 0 days | Status: CP
Start: 2021-11-26 — End: ?

## 2021-11-26 MED ORDER — SPIRIVA RESPIMAT 2.5 MCG/ACTUATION SOLUTION FOR INHALATION
11 refills | 0.00000 days | Status: CP
Start: 2021-11-26 — End: ?

## 2021-11-28 NOTE — Unmapped (Signed)
[facility] Internal Medicine   CHRONIC CARE MANAGEMENT OUTREACH ENCOUNTER           Date of Service:  11/28/2021      Service:  Care Coordination - phone  Is there someone else in the room? No.   MyChart use by patient is active: yes    Post-outreach Action Items:  ??? Provider: No/none.  ??? CM: No/none.  ??? Patient: No/none.    Chronic Care Management (CCM) Outreach  ??? Purpose of outreach:  Diabetes and Hypertension   ??? Care Manager (CM) completed the following:  o Reviewed chart  o Identified following:   - DM, HTN, Insomnia    Symptom Concern:  Patient shared concern over shakiness and uncontrollable jerking movements in hands.   - Patient reports this has been going on for a long time, but has noticeably worsened over the past few months.   - She cannot hold anything in her hands and is dropping things.   - She has a new appointment scheduled with neurologist Dr. Rulon Eisenmenger in January. Patient asked if this appointment could be made sooner. CA attempted to identify sooner appointment time, but none were available. Patient voiced understanding.    Insomnia:  Patient's insomnia was discussed in previous CCM call and at previous visit with Dr. Brooke Dare.  - She has not needed hydroxyzine which was prescribed by Dr. Brooke Dare for PRN use.   - States she has been doing pretty good in regards to sleep.    Diabetes:  ??? Experiencing symptoms of high blood sugar?: no  ??? Experiencing symptoms of low blood sugar?: no  ??? Experiencing symptoms of numbness or tingling in fingers or toes?: no  ??? Regularly checking blood sugars?: yes  o If yes, when? before breakfast  ??? Typical daily diet: breakfast: 2 slices of bacon, egg, and a biscuit; dinner: meat but varies; notes that her appetite has decreased.  ??? Weekly physical activity: none    Date Before Breakfast   11/28/21 150     Hypertension:  ??? Have blood pressure cuff at home?: yes- arm cuff  ??? Regularly checking blood pressure?: yes  o Experienced really high blood pressure (systolic >180 mmHg or diastolic >110 mmHg)?: no  o Experienced really low blood pressure (systolic <90 mmHg or diastolic <60 mmHg)?: no  ??? Symptoms of low blood pressures? no  ??? Symptoms of high blood pressures? no     PTHomeBP      Updates  ??? New concerns or symptoms: No/ none.  ??? New barriers: No/ none.  ??? Recent hospitalization / ED / Urgent Care or providers seen outside the [facility] system: No/ none.  ??? Refills needed: No/ none.  ??? Medication response/ notes: No/none.     Wrap-up  ??? Reviewed upcoming clinic appointment(s): Yes.  o Plans to keep appointment(s): Yes.  o Transportation assistance needed: No. Daughter assists with transportation.   o Patient was encouraged to reach out to their provider with any questions or concerns  Future Appointments   Date Time Provider Department Center   12/18/2021 10:00 AM Chriss Driver, MD [facility]   01/07/2022  3:15 PM Ana Ulysees Barns, MD [facility]   01/16/2022 11:30 AM Carin Hock, MD [facility]   01/22/2022  1:30 PM Abhijit Lonia Blood, MD [facility]   02/02/2022  2:00 PM [facility] [place] [facility]   02/19/2022  1:15 PM Artelia Laroche, MD [facility]  05/08/2022  1:30 PM Zannie Cove, MD [facility]       A copy of this Patient Outreach/CCM Encounter was sent to patient's Primary Care Provider     CCM Documentation  Time spent in direct care with patient and/or health care proxy via non-in-person encounter(s): 25  Time spent in indirect patient care and coordination: 5

## 2021-11-29 LAB — URINALYSIS WITH CULTURE REFLEX
BILIRUBIN UA: NEGATIVE
BLOOD UA: NEGATIVE
GLUCOSE UA: NEGATIVE
HYALINE CASTS: 6 /LPF — ABNORMAL HIGH (ref 0–1)
KETONES UA: NEGATIVE
NITRITE UA: NEGATIVE
PH UA: 6 (ref 5.0–9.0)
PROTEIN UA: NEGATIVE
RBC UA: <1 /HPF (ref <=4)
SPECIFIC GRAVITY UA: 1.01 (ref 1.003–1.030)
SQUAMOUS EPITHELIAL: 3 /HPF (ref 0–5)
UROBILINOGEN UA: <2
WBC UA: 2 /HPF (ref 0–5)

## 2021-11-29 LAB — COMPREHENSIVE METABOLIC PANEL
ALBUMIN: 3.6 g/dL (ref 3.4–5.0)
ALKALINE PHOSPHATASE: 75 U/L (ref 46–116)
ALT (SGPT): <7 U/L — ABNORMAL LOW (ref 10–49)
ANION GAP: 10 mmol/L (ref 5–14)
AST (SGOT): 23 U/L (ref <=34)
BILIRUBIN TOTAL: 0.4 mg/dL (ref 0.3–1.2)
BLOOD UREA NITROGEN: 86 mg/dL — ABNORMAL HIGH (ref 9–23)
BUN / CREAT RATIO: 27
CALCIUM: 8.8 mg/dL (ref 8.7–10.4)
CHLORIDE: 89 mmol/L — ABNORMAL LOW (ref 98–107)
CO2: 33 mmol/L — ABNORMAL HIGH (ref 20.0–31.0)
CREATININE: 3.15 mg/dL — ABNORMAL HIGH
EGFR CKD-EPI (2021) FEMALE: 14 mL/min/{1.73_m2} — ABNORMAL LOW (ref >=60)
GLUCOSE RANDOM: 230 mg/dL — ABNORMAL HIGH (ref 70–179)
POTASSIUM: 4.9 mmol/L — ABNORMAL HIGH (ref 3.4–4.8)
PROTEIN TOTAL: 7.9 g/dL (ref 5.7–8.2)
SODIUM: 132 mmol/L — ABNORMAL LOW (ref 135–145)

## 2021-11-29 LAB — CBC W/ AUTO DIFF
BASOPHILS ABSOLUTE COUNT: 0.1 10*9/L (ref 0.0–0.1)
BASOPHILS RELATIVE PERCENT: 1.5 %
EOSINOPHILS ABSOLUTE COUNT: 0.1 10*9/L (ref 0.0–0.5)
EOSINOPHILS RELATIVE PERCENT: 1.1 %
HEMATOCRIT: 28.6 % — ABNORMAL LOW (ref 34.0–44.0)
HEMOGLOBIN: 8.8 g/dL — ABNORMAL LOW (ref 11.3–14.9)
LYMPHOCYTES ABSOLUTE COUNT: 0.7 10*9/L — ABNORMAL LOW (ref 1.1–3.6)
LYMPHOCYTES RELATIVE PERCENT: 8.3 %
MEAN CORPUSCULAR HEMOGLOBIN CONC: 30.7 g/dL — ABNORMAL LOW (ref 32.0–36.0)
MEAN CORPUSCULAR HEMOGLOBIN: 23.6 pg — ABNORMAL LOW (ref 25.9–32.4)
MEAN CORPUSCULAR VOLUME: 76.8 fL — ABNORMAL LOW (ref 77.6–95.7)
MEAN PLATELET VOLUME: 8.6 fL (ref 6.8–10.7)
MONOCYTES ABSOLUTE COUNT: 1.1 10*9/L — ABNORMAL HIGH (ref 0.3–0.8)
MONOCYTES RELATIVE PERCENT: 12.2 %
NEUTROPHILS ABSOLUTE COUNT: 6.6 10*9/L (ref 1.8–7.8)
NEUTROPHILS RELATIVE PERCENT: 76.9 %
PLATELET COUNT: 314 10*9/L (ref 150–450)
RED BLOOD CELL COUNT: 3.73 10*12/L — ABNORMAL LOW (ref 3.95–5.13)
RED CELL DISTRIBUTION WIDTH: 17.5 % — ABNORMAL HIGH (ref 12.2–15.2)
WBC ADJUSTED: 8.6 10*9/L (ref 3.6–11.2)

## 2021-11-29 LAB — SLIDE REVIEW

## 2021-11-29 LAB — B-TYPE NATRIURETIC PEPTIDE: B-TYPE NATRIURETIC PEPTIDE: 622 pg/mL — ABNORMAL HIGH (ref <=100)

## 2021-11-29 LAB — MAGNESIUM: MAGNESIUM: 3.5 mg/dL — ABNORMAL HIGH (ref 1.6–2.6)

## 2021-11-29 LAB — PHOSPHORUS: PHOSPHORUS: 5.2 mg/dL — ABNORMAL HIGH (ref 2.4–5.1)

## 2021-11-29 LAB — HIGH SENSITIVITY TROPONIN I - SINGLE: HIGH SENSITIVITY TROPONIN I: 63 ng/L (ref <=34)

## 2021-11-30 ENCOUNTER — Ambulatory Visit: Admit: 2021-11-30 | Discharge: 2021-12-02 | Disposition: A | Discharge status: Home Health | Payer: MEDICARE

## 2021-11-30 LAB — CBC
HEMATOCRIT: 28.2 % — ABNORMAL LOW (ref 34.0–44.0)
HEMOGLOBIN: 8.9 g/dL — ABNORMAL LOW (ref 11.3–14.9)
MEAN CORPUSCULAR HEMOGLOBIN CONC: 31.5 g/dL — ABNORMAL LOW (ref 32.0–36.0)
MEAN CORPUSCULAR HEMOGLOBIN: 23.9 pg — ABNORMAL LOW (ref 25.9–32.4)
MEAN CORPUSCULAR VOLUME: 75.8 fL — ABNORMAL LOW (ref 77.6–95.7)
MEAN PLATELET VOLUME: 7.9 fL (ref 6.8–10.7)
PLATELET COUNT: 269 10*9/L (ref 150–450)
RED BLOOD CELL COUNT: 3.72 10*12/L — ABNORMAL LOW (ref 3.95–5.13)
RED CELL DISTRIBUTION WIDTH: 17.4 % — ABNORMAL HIGH (ref 12.2–15.2)
WBC ADJUSTED: 6.2 10*9/L (ref 3.6–11.2)

## 2021-11-30 LAB — PHOSPHORUS: PHOSPHORUS: 5.2 mg/dL — ABNORMAL HIGH (ref 2.4–5.1)

## 2021-11-30 LAB — BASIC METABOLIC PANEL
ANION GAP: 7 mmol/L (ref 5–14)
ANION GAP: 8 mmol/L (ref 5–14)
ANION GAP: 10 mmol/L (ref 5–14)
BLOOD UREA NITROGEN: 77 mg/dL — ABNORMAL HIGH (ref 9–23)
BLOOD UREA NITROGEN: 78 mg/dL — ABNORMAL HIGH (ref 9–23)
BLOOD UREA NITROGEN: 80 mg/dL — ABNORMAL HIGH (ref 9–23)
BUN / CREAT RATIO: 26
BUN / CREAT RATIO: 27
BUN / CREAT RATIO: 28
CALCIUM: 8.8 mg/dL (ref 8.7–10.4)
CALCIUM: 8.7 mg/dL (ref 8.7–10.4)
CALCIUM: 8.9 mg/dL (ref 8.7–10.4)
CHLORIDE: 92 mmol/L — ABNORMAL LOW (ref 98–107)
CHLORIDE: 91 mmol/L — ABNORMAL LOW (ref 98–107)
CHLORIDE: 90 mmol/L — ABNORMAL LOW (ref 98–107)
CO2: 34 mmol/L — ABNORMAL HIGH (ref 20.0–31.0)
CO2: 34 mmol/L — ABNORMAL HIGH (ref 20.0–31.0)
CO2: 34 mmol/L — ABNORMAL HIGH (ref 20.0–31.0)
CREATININE: 2.91 mg/dL — ABNORMAL HIGH
CREATININE: 2.88 mg/dL — ABNORMAL HIGH
CREATININE: 2.84 mg/dL — ABNORMAL HIGH
EGFR CKD-EPI (2021) FEMALE: 16 mL/min/{1.73_m2} — ABNORMAL LOW (ref >=60)
EGFR CKD-EPI (2021) FEMALE: 16 mL/min/{1.73_m2} — ABNORMAL LOW (ref >=60)
EGFR CKD-EPI (2021) FEMALE: 16 mL/min/{1.73_m2} — ABNORMAL LOW (ref >=60)
GLUCOSE RANDOM: 168 mg/dL (ref 70–179)
GLUCOSE RANDOM: 219 mg/dL — ABNORMAL HIGH (ref 70–179)
GLUCOSE RANDOM: 170 mg/dL (ref 70–179)
POTASSIUM: 4.2 mmol/L (ref 3.4–4.8)
POTASSIUM: 4.2 mmol/L (ref 3.4–4.8)
POTASSIUM: 4 mmol/L (ref 3.4–4.8)
SODIUM: 133 mmol/L — ABNORMAL LOW (ref 135–145)
SODIUM: 133 mmol/L — ABNORMAL LOW (ref 135–145)
SODIUM: 134 mmol/L — ABNORMAL LOW (ref 135–145)

## 2021-11-30 LAB — AMMONIA: AMMONIA: 21 umol/L (ref 11–32)

## 2021-11-30 LAB — MAGNESIUM
MAGNESIUM: 3.7 mg/dL — ABNORMAL HIGH (ref 1.6–2.6)
MAGNESIUM: 3.6 mg/dL — ABNORMAL HIGH (ref 1.6–2.6)

## 2021-11-30 LAB — HIGH SENSITIVITY TROPONIN I - 2H/6H SERIAL
HIGH SENSITIVITY TROPONIN - DELTA (0-2H): 2 ng/L (ref <=7)
HIGH-SENSITIVITY TROPONIN I - 2 HOUR: 65 ng/L (ref <=34)

## 2021-11-30 LAB — HIGH SENSITIVITY TROPONIN I - 4 HOUR SERIAL
HIGH SENSITIVITY TROPONIN - DELTA (2-6H): 9 ng/L — ABNORMAL HIGH (ref <=7)
HIGH-SENSITIVITY TROPONIN I - 6 HOUR: 74 ng/L (ref <=34)

## 2021-11-30 LAB — TSH: THYROID STIMULATING HORMONE: 2.144 u[IU]/mL (ref 0.550–4.780)

## 2021-11-30 MED ADMIN — furosemide (LASIX) injection 40 mg: 40 mg | INTRAVENOUS | @ 06:00:00 | Stop: 2021-11-30

## 2021-11-30 MED ADMIN — oxyCODONE (ROXICODONE) immediate release tablet 10 mg: 10 mg | ORAL | @ 03:00:00 | Stop: 2021-11-29

## 2021-11-30 MED ADMIN — furosemide (LASIX) injection 40 mg: 40 mg | INTRAVENOUS | @ 20:00:00

## 2021-11-30 MED ADMIN — apixaban (ELIQUIS) tablet 2.5 mg: 2.5 mg | ORAL | @ 15:00:00

## 2021-11-30 MED ADMIN — fluticasone furoate-vilanteroL (BREO ELLIPTA) 100-25 mcg/dose inhaler 1 puff: 1 | RESPIRATORY_TRACT | @ 16:00:00

## 2021-11-30 MED ADMIN — gabapentin (NEURONTIN) capsule 600 mg: 600 mg | ORAL | @ 03:00:00 | Stop: 2021-11-29

## 2021-11-30 MED ADMIN — oxyCODONE (OxyCONTIN) 12 hr crush resistant ER/CR tablet 10 mg: 10 mg | ORAL | @ 15:00:00 | Stop: 2021-12-14

## 2021-11-30 MED ADMIN — insulin lispro (HumaLOG) injection 0-20 Units: 0-20 [IU] | SUBCUTANEOUS | @ 17:00:00

## 2021-11-30 MED ADMIN — insulin lispro (HumaLOG) injection 0-20 Units: 0-20 [IU] | SUBCUTANEOUS | @ 22:00:00

## 2021-11-30 MED ADMIN — albuterol 2.5 mg /3 mL (0.083 %) nebulizer solution 5 mg: 5 mg | RESPIRATORY_TRACT | @ 03:00:00 | Stop: 2021-11-29

## 2021-11-30 MED ADMIN — ipratropium (ATROVENT) 0.02 % nebulizer solution 500 mcg: 500 ug | RESPIRATORY_TRACT | @ 03:00:00 | Stop: 2021-11-29

## 2021-11-30 MED ADMIN — montelukast (SINGULAIR) tablet 10 mg: 10 mg | ORAL | @ 15:00:00

## 2021-11-30 MED ADMIN — amiodarone (PACERONE) tablet 100 mg: 100 mg | ORAL | @ 15:00:00

## 2021-11-30 MED ADMIN — metOLazone (ZAROXOLYN) tablet 5 mg: 5 mg | ORAL | @ 11:00:00 | Stop: 2021-11-30

## 2021-11-30 MED ADMIN — umeclidinium (INCRUSE ELLIPTA) 62.5 mcg/actuation inhaler 1 puff: 1 | RESPIRATORY_TRACT | @ 17:00:00

## 2021-11-30 MED ADMIN — pantoprazole (PROTONIX) EC tablet 20 mg: 20 mg | ORAL | @ 15:00:00

## 2021-11-30 MED ADMIN — polyethylene glycol (MIRALAX) packet 17 g: 17 g | ORAL | @ 15:00:00

## 2021-11-30 NOTE — Unmapped (Signed)
[facility]  Emergency Department Provider Note       ED Clinical Impression     Final diagnoses:   None        Impression, ED Course, Assessment and Plan     Impression/MDM::    Barbara Huber is a 80 y.o. female and  has a past medical history of Acute kidney injury superimposed on chronic kidney disease (CMS-HCC) (10/11/2020), Acute on chronic diastolic (congestive) heart failure (CMS-HCC) (08/23/2020), Arthritis, Calculus of kidney, Calculus of ureter, CHF (congestive heart failure) (CMS-HCC), Chronic atrial fibrillation (CMS-HCC) (07/20/2019), COPD (chronic obstructive pulmonary disease) (CMS-HCC), Diabetes (CMS-HCC), Gangrenous cholecystitis (10/11/2020), GERD (gastroesophageal reflux disease), Hydronephrosis, Hypertension, Hyponatremia (10/11/2020), Intermediate coronary syndrome (CMS-HCC) (03/13/2014), Lower extremity edema (09/28/2020), Lumbar stenosis, Microscopic hematuria, Nausea alone, Nephrolithiasis (04/17/2016), Neuropathy, Nocturia, Other chronic cystitis, Pulmonary hypertension (CMS-HCC), Renal colic, Sleep apnea, and Unstable angina pectoris (CMS-HCC) (03/13/2014). presenting with increased oxygen requirement at home with shortness of breath and weakness over the past 2 days.    Initial evaluation, patient is well-appearing in no acute respiratory distress, satting 100% on nonrebreather.  Patient had episode of desaturation in the waiting room while on her portable oxygen concentrator.  I was able to quickly titrate her oxygen down to 3 L nasal cannula maintaining a sat of 92%.  Patient does not have any wheezing or increased respiratory effort but does have some crackles on the lung bases with mild lower extremity edema.  Soft nontender abdomen.  Patient well perfused and mentating normally.    With her history of COPD, COPD exacerbation is high on differential.  Could also be CHF exacerbation.  UTI or other infectious causes or metabolic causes are also likely.  We will do broad work-up.    Orders Placed This Encounter   Procedures   ??? Respiratory Pathogen Panel with COVID-19   ??? Urine Culture   ??? XR Chest Portable   ??? Comprehensive Metabolic Panel   ??? CBC w/ Differential   ??? Magnesium Level   ??? Phosphorus Level   ??? Urinalysis with Culture Reflex   ??? hsTroponin I (single, no delta)   ??? B-type natriuretic peptide   ??? hsTroponin I (serial 2-6H CONTINUATION w/ delta)   ??? hsTroponin I - 6 Hour   ??? Basic metabolic panel   ??? Magnesium Level   ??? Phosphorus Level   ??? CBC   ??? Ammonia   ??? Basic Metabolic Panel   ??? Magnesium Level   ??? TSH   ??? Nutrition Therapy Heart Healthy   ??? Weigh patient   ??? Strict intake and output   ??? Vital signs   ??? Notify Provider   ??? Notify Provider   ??? Measure height   ??? Weigh patient   ??? Activity Level: No Restrictions   ??? Patient may shower   ??? Incentive Spirometry   ??? Flush line per protocol   ??? Notify Provider   ??? POCT Glucose - RN Obtain   ??? Full Code   ??? OT Eval and Treat   ??? PT Eval and Treat   ??? Pt may use home Bipap/ CPAP   ??? Adult Oxygen therapy   ??? ECG 12 Lead   ??? Insert peripheral IV   ??? Saline lock IV   ??? Place Patient in Bed   ??? ED Admit Decision       Medications   acetaminophen (TYLENOL) tablet 1,000 mg (has no administration in time range)   fluticasone furoate-vilanteroL (BREO ELLIPTA) 100-25 mcg/dose  inhaler 1 puff (has no administration in time range)   albuterol (PROVENTIL HFA;VENTOLIN HFA) 90 mcg/actuation inhaler 2 puff (has no administration in time range)   amiodarone (PACERONE) tablet 100 mg (has no administration in time range)   ferrous sulfate tablet 325 mg (has no administration in time range)   gabapentin (NEURONTIN) capsule 300 mg (has no administration in time range)   metOLazone (ZAROXOLYN) tablet 5 mg (has no administration in time range)   furosemide (LASIX) injection 40 mg (has no administration in time range)   montelukast (SINGULAIR) tablet 10 mg (has no administration in time range)   oxyCODONE (OxyCONTIN) 12 hr crush resistant ER/CR tablet 10 mg (has no administration in time range)   pantoprazole (PROTONIX) EC tablet 20 mg (has no administration in time range)   umeclidinium (INCRUSE ELLIPTA) 62.5 mcg/actuation inhaler 1 puff (has no administration in time range)   tafamidis cap 61 mg (has no administration in time range)   naloxone (NARCAN) injection 0.1 mg (has no administration in time range)   melatonin tablet 3 mg (has no administration in time range)   polyethylene glycol (MIRALAX) packet 17 g (has no administration in time range)   senna (SENOKOT) tablet 2 tablet (has no administration in time range)   ondansetron (ZOFRAN-ODT) disintegrating tablet 8 mg (has no administration in time range)     Or   ondansetron (ZOFRAN) injection 4 mg (has no administration in time range)   calcium carbonate (TUMS) chewable tablet 400 mg of elem calcium (has no administration in time range)   guaiFENesin (ROBITUSSIN) oral syrup (has no administration in time range)   glucose chewable tablet 16 g (has no administration in time range)   dextrose 50 % in water (D50W) 50 % solution 12.5 g (has no administration in time range)   glucagon injection 1 mg (has no administration in time range)   insulin lispro (HumaLOG) injection 0-20 Units (has no administration in time range)   apixaban (ELIQUIS) tablet 2.5 mg (has no administration in time range)   albuterol 2.5 mg /3 mL (0.083 %) nebulizer solution 5 mg (5 mg Nebulization Given 11/29/21 2205)   ipratropium (ATROVENT) 0.02 % nebulizer solution 500 mcg (500 mcg Nebulization Given 11/29/21 2205)   oxyCODONE (ROXICODONE) immediate release tablet 10 mg (10 mg Oral Given 11/29/21 2213)   gabapentin (NEURONTIN) capsule 600 mg (600 mg Oral Given 11/29/21 2213)   furosemide (LASIX) injection 40 mg (40 mg Intravenous Given 11/30/21 0040)       ED Course as of 11/30/21 0406   Sat Nov 29, 2021   2125 Phosphorus(!): 5.2   2125 Magnesium(!): 3.5   2125 hsTroponin I(!!): 63   2125 Potassium(!): 4.9   2125 Creatinine(!): 3.15   2125 HGB(!): 8.8 2125 Patient has lots of abnormalities on her blood tests which all seem to be chronic.  Patient is stage IV [name] so is unusual for her creatinine to be elevated and have this electrolyte issues.  On chart review she has had some electrolytes as an outpatient labs done also so this is baseline.  Troponin is also been elevated slightly before during suspected CHF exacerbation, which is consistent with the patient's elevated BNP and chest x-ray read of some mild pulmonary edema.  Patient reports normal p.o. intake and is good medication compliance.  Etiology of the exacerbation still unknown though we are waiting on the urine.   2200 UA shows trace leukocyte Estrace with negative nitrite WBCs and rare urine.  Patient  has no urinary symptoms so I do not believe there is a UTI.       Ultimately with the patient's aberrant lab values, troponin and BNP which are above her baseline, we believe she would be safest if to be admitted for the CHF exacerbation.  Will page MAO.    ____________________________________________    I discussed the findings of the medical work-up, my rationale behind the medical decision making process and my treatment plan with the patient. All questions were answered. The patient understands and is comfortable with the plan of care. I encouraged the patient to seek follow-up with their primary care provider. Strict return precautions given.    The case was discussed with Zannie Cove, MD who is in agreement with the above assessment and plan    Dictation software was used while making this note. Please excuse any errors made with dictation software.    Additional Medical Decision Making     I have reviewed the vital signs and the nursing notes. Labs and radiology results that were available during my care of the patient were independently reviewed by me and considered in my medical decision making.     I independently visualized the EKG tracing if performed  I independently visualized the radiology images if performed  I reviewed the patient's prior medical records if available.  Additional history obtained from family if available  I discussed the case with the admitting provider and the consulting services if the patient was admitted and/or consulting services were utilized.     History     Chief Complaint   Patient presents with   ??? Shortness of Breath       HPI     Sagan Ballinger is a 80 y.o. 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  Patient on 3 L of cannula at baseline and had to go to 3.5 L in the past 2 days.  Denies new onset cough or mucus production.  No chest pain URI symptoms or fever/chills.  No nausea vomiting abdominal pain or diarrhea.  Baseline low appetite with normal p.o. intake at this time.  Good medication compliance.  She reports that she sometimes takes an extra dose of diuretic if she feels she has gained water weight.    Additionally patient does not endorse any new or changing symptoms unless otherwise documented above.     ROS: 12 point ROS otherwise negative except as documented.    Past Medical History:   Diagnosis Date   ??? Acute kidney injury superimposed on chronic kidney disease (CMS-HCC) 10/11/2020   ??? Acute on chronic diastolic (congestive) heart failure (CMS-HCC) 08/23/2020   ??? Arthritis    ??? Calculus of kidney    ??? Calculus of ureter    ??? CHF (congestive heart failure) (CMS-HCC)    ??? Chronic atrial fibrillation (CMS-HCC) 07/20/2019   ??? COPD (chronic obstructive pulmonary disease) (CMS-HCC)    ??? Diabetes (CMS-HCC)    ??? Gangrenous cholecystitis 10/11/2020   ??? GERD (gastroesophageal reflux disease)    ??? Hydronephrosis    ??? Hypertension    ??? Hyponatremia 10/11/2020   ??? Intermediate coronary syndrome (CMS-HCC) 03/13/2014   ??? Lower extremity edema 09/28/2020   ??? Lumbar stenosis    ??? Microscopic hematuria    ??? Nausea alone    ??? Nephrolithiasis 04/17/2016   ??? Neuropathy    ??? Nocturia    ??? Other chronic  cystitis    ??? Pulmonary hypertension (CMS-HCC)    ??? Renal colic    ??? Sleep apnea    ??? Unstable angina pectoris (CMS-HCC) 03/13/2014       Past Surgical History:   Procedure Laterality Date   ??? BACK SURGERY  1995   ??? CARPAL TUNNEL RELEASE Left 2014   ??? HYSTERECTOMY  1971   ??? IR INSERT CHOLECYSTOSMY TUBE PERCUTANEOUS  10/02/2020    IR INSERT CHOLECYSTOSMY TUBE PERCUTANEOUS 10/02/2020 Braulio Conte, MD IMG VIR [facility]   ??? LUMBAR DISC SURGERY     ??? PR REMOVAL GALLBLADDER N/A 10/06/2020    Procedure: CHOLECYSTECTOMY;  Surgeon: Katherina Mires, MD;  Location: MAIN OR [facility];  Service: Trauma   ??? PR RIGHT HEART CATH O2 SATURATION & CARDIAC OUTPUT N/A 09/30/2020    Procedure: Right Heart Catheterization;  Surgeon: Neal Dy, MD;  Location: [facility] CATH;  Service: Cardiology   ??? PR RIGHT HEART CATH O2 SATURATION & CARDIAC OUTPUT N/A 01/09/2021    Procedure: Right Heart Catheterization;  Surgeon: Lesle Reek, MD;  Location: [facility] CATH;  Service: Cardiology         Current Facility-Administered Medications:   ???  acetaminophen (TYLENOL) tablet 1,000 mg, 1,000 mg, Oral, Q8H PRN, Rimma Osipov, MD  ???  albuterol (PROVENTIL HFA;VENTOLIN HFA) 90 mcg/actuation inhaler 2 puff, 2 puff, Inhalation, Q8H PRN, Zannie Cove, MD  ???  amiodarone (PACERONE) tablet 100 mg, 100 mg, Oral, Daily, Rimma Osipov, MD  ???  apixaban (ELIQUIS) tablet 2.5 mg, 2.5 mg, Oral, BID, Zannie Cove, MD  ???  calcium carbonate (TUMS) chewable tablet 400 mg of elem calcium, 400 mg of elem calcium, Oral, Daily PRN, Rimma Osipov, MD  ???  dextrose 50 % in water (D50W) 50 % solution 12.5 g, 12.5 g, Intravenous, Q10 Min PRN, Rimma Osipov, MD  ???  ferrous sulfate tablet 325 mg, 325 mg, Oral, Daily, Rimma Osipov, MD  ???  fluticasone furoate-vilanteroL (BREO ELLIPTA) 100-25 mcg/dose inhaler 1 puff, 1 puff, Inhalation, Daily (RT), Rimma Osipov, MD  ???  [START ON 12/01/2021] furosemide (LASIX) injection 40 mg, 40 mg, Intravenous, BID, Zannie Cove, MD  ???  gabapentin (NEURONTIN) capsule 300 mg, 300 mg, Oral, At bedtime, Rimma Osipov, MD  ???  glucagon injection 1 mg, 1 mg, Intramuscular, Once PRN, Rimma Osipov, MD  ???  glucose chewable tablet 16 g, 16 g, Oral, Q10 Min PRN, Rimma Osipov, MD  ???  guaiFENesin (ROBITUSSIN) oral syrup, 200 mg, Oral, Q4H PRN, Zannie Cove, MD  ???  insulin lispro (HumaLOG) injection 0-20 Units, 0-20 Units, Subcutaneous, ACHS, Rimma Osipov, MD  ???  melatonin tablet 3 mg, 3 mg, Oral, Nightly PRN, Rimma Osipov, MD  ???  metOLazone (ZAROXOLYN) tablet 5 mg, 5 mg, Oral, Once, Zannie Cove, MD  ???  montelukast (SINGULAIR) tablet 10 mg, 10 mg, Oral, Daily, Zannie Cove, MD  ???  naloxone (NARCAN) injection 0.1 mg, 0.1 mg, Intravenous, Q5 Min PRN, Rimma Osipov, MD  ???  ondansetron (ZOFRAN-ODT) disintegrating tablet 8 mg, 8 mg, Oral, Q8H PRN **OR** ondansetron (ZOFRAN) injection 4 mg, 4 mg, Intravenous, Q8H PRN, Rimma Osipov, MD  ???  oxyCODONE (OxyCONTIN) 12 hr crush resistant ER/CR tablet 10 mg, 10 mg, Oral, Q12H, Rimma Osipov, MD  ???  pantoprazole (PROTONIX) EC tablet 20 mg, 20 mg, Oral, Daily, Zannie Cove, MD  ???  polyethylene glycol (MIRALAX) packet 17 g, 17 g, Oral, Daily, Rimma Osipov, MD  ???  senna (SENOKOT) tablet 2  tablet, 2 tablet, Oral, Nightly, Rimma Osipov, MD  ???  tafamidis cap 61 mg, 61 mg, Oral, Daily, Rimma Osipov, MD  ???  umeclidinium (INCRUSE ELLIPTA) 62.5 mcg/actuation inhaler 1 puff, 1 puff, Inhalation, Daily (RT), Rimma Osipov, MD    Current Outpatient Medications:   ???  ACCU-CHEK AVIVA PLUS TEST STRP Strp, UESE TO CHECK BLOOD SUGAR 3 TIMES A DAY BEFORE MEALS, Disp: 100 each, Rfl: 11  ???  acetaminophen (TYLENOL) 500 MG tablet, Take 1,000 mg by mouth daily as needed for pain. , Disp: , Rfl:   ???  ADVAIR HFA 115-21 mcg/actuation inhaler, INHALE TWO PUFFS BY MOUTH TWICE A DAY, Disp: 12 g, Rfl: 11  ???  albuterol HFA 90 mcg/actuation inhaler, Inhale 2 puffs every eight (8) hours as needed for wheezing., Disp: , Rfl:   ???  amiodarone (PACERONE) 200 MG tablet, Take 0.5 tablets (100 mg total) by mouth daily., Disp: 45 tablet, Rfl: 3  ???  apixaban (ELIQUIS) 5 mg Tab, Take 0.5 tablets (2.5 mg total) by mouth Two (2) times a day., Disp: 60 tablet, Rfl: 11  ???  estradioL (ESTRACE) 0.01 % (0.1 mg/gram) vaginal cream, INSERT ONE GRAM VAGINALLY AT BEDTIME, Disp: , Rfl:   ???  FEROSUL 325 mg (65 mg iron) tablet, , Disp: , Rfl:   ???  fosfomycin (MONUROL) 3 gram Pack, Take 3 g by mouth once a week., Disp: 36 g, Rfl: 0  ???  gabapentin (NEURONTIN) 300 MG capsule, Take 1 capsule (300 mg total) by mouth at bedtime., Disp: 90 capsule, Rfl: 3  ???  lancets Misc, 1 each by Miscellaneous route daily. Accu Check, Disp: 100 each, Rfl: 6  ???  metOLazone (ZAROXOLYN) 5 MG tablet, Take 1 tablet (5 mg total) by mouth daily as needed (when instructed by cardiology clinic)., Disp: 30 tablet, Rfl: 1  ???  montelukast (SINGULAIR) 10 mg tablet, Take 1 tablet (10 mg total) by mouth daily., Disp: 90 tablet, Rfl: 3  ???  NARCAN 4 mg/actuation nasal spray, 1 spray into alternating nostrils once as needed (opioid overdose). PRN - Emergency use., Disp: , Rfl:   ???  NON FORMULARY, APPLY FROM NECK DOWN TWICE A DAY, Disp: , Rfl:   ???  oxyCODONE (OXYCONTIN) 10 mg TR12 12 hr crush resistant ER/CR tablet, Take 10 mg by mouth every twelve (12) hours., Disp: , Rfl:   ???  OXYGEN-AIR DELIVERY SYSTEMS MISC, 5 L by Miscellaneous route. 3 L/min via [place], Disp: , Rfl:   ???  pantoprazole (PROTONIX) 20 MG tablet, Take 1 tablet (20 mg total) by mouth in the morning., Disp: 60 tablet, Rfl: 5  ???  rosuvastatin (CRESTOR) 5 MG tablet, Take 1 tablet (5 mg total) by mouth every other day., Disp: 15 tablet, Rfl: 11  ???  SPIRIVA RESPIMAT 2.5 mcg/actuation inhalation mist, INHALE TWO PUFFS BY MOUTH ONCE DAILY, Disp: 4 g, Rfl: 11  ???  spironolactone (ALDACTONE) 25 MG tablet, Take 25 mg by mouth in the morning., Disp: , Rfl:   ???  tafamidis 61 mg cap, Take 1 capsule (61 mg) by mouth daily., Disp: 30 capsule, Rfl: 11  ???  torsemide (DEMADEX) 20 MG tablet, Take 1 tablet (20 mg total) by mouth 3 (three) times a day. May also take 1 tablet (20 mg total) daily as needed (weight gain)., Disp: 60 tablet, Rfl: 11  ???  varicella-zoster [id number]-[id number], PF, (SHINGRIX, PF,) 50 mcg/0.5 mL SusR injection, Inject 0.5 mL into the muscle., Disp: 0.5 mL, Rfl: 1  Allergies  Nitrofurantoin and Lipitor [atorvastatin]    Family History   Problem Relation Age of Onset   ??? Hypertension Mother    ??? Cancer Father         COLON CANCER   ??? Anesthesia problems Neg Hx    ??? Broken bones Neg Hx    ??? Clotting disorder Neg Hx    ??? Collagen disease Neg Hx    ??? Diabetes Neg Hx    ??? Dislocations Neg Hx    ??? Fibromyalgia Neg Hx    ??? Gout Neg Hx    ??? Hemophilia Neg Hx    ??? Osteoporosis Neg Hx    ??? Rheumatologic disease Neg Hx    ??? Scoliosis Neg Hx    ??? Severe sprains Neg Hx    ??? Sickle cell anemia Neg Hx    ??? Spinal Compression Fracture Neg Hx    ??? GU problems Neg Hx    ??? Kidney cancer Neg Hx    ??? Prostate cancer Neg Hx        Social History     Tobacco Use   ??? Smoking status: Former     Packs/day: 0.33     Types: Cigarettes     Quit date: 2019     Years since quitting: 3.9   ??? Smokeless tobacco: Never   ??? Tobacco comments:     Quit a few years ago   Vaping Use   ??? Vaping Use: Never used   Substance Use Topics   ??? Alcohol use: No   ??? Drug use: No        Physical Exam     Vitals:    11/29/21 2100 11/29/21 2101 11/29/21 2200 11/30/21 0016   BP: 119/63  116/65 116/61   Pulse: 63 63 63 59   Resp: 11 10 11 13    Temp:       TempSrc:       SpO2:  96% 95%        Constitutional: Alert and oriented.  Nontoxic appearing and in no distress.  HEENT: Normocephalic and atraumatic. Conjunctivae are normal.  Normal extraocular movements. No nasal discharge or epistaxis.  Normal oropharynx.  Moist mucous membranes.   Cardiovascular: Regular rate and rhythm. No murmurs. Normal and symmetric distal pulses.  Respiratory: Normal respiratory effort. Good bilateral breath sounds without wheezing.  Bilateral basal crackles  Gastrointestinal: Soft, non-tender, no guarding, no rebound tenderness, non-distended. No palpable masses.  Genitourinary: No CVA tenderness.  Musculoskeletal: No obvious deformities.  Normal range of motion of all 4 extremities.  +1 bilateral lower extremity edema up to the midshin   skin: Skin is warm, dry and intact. No rash noted.  Neurologic: Alert and oriented.  Normal speech and language. Face is symmetric at rest and with speech. Patient is moving all extremities equally. No gross focal neurologic deficits are appreciated.  Psychiatric: Mood and affect are normal. Speech and behavior are normal.     Radiology     XR Chest Portable   Preliminary Result   Bilateral lower lung zone opacities similar to prior study, favored to represent pulmonary edema.           Laboratory Data     Lab Results   Component Value Date    WBC 8.6 11/29/2021    HGB 8.8 (L) 11/29/2021    HCT 28.6 (L) 11/29/2021    PLT 314 11/29/2021       Lab Results   Component Value  Date    NA 132 (L) 11/29/2021    K 4.9 (H) 11/29/2021    CL 89 (L) 11/29/2021    CO2 33.0 (H) 11/29/2021    BUN 86 (H) 11/29/2021    CREATININE 3.15 (H) 11/29/2021    GLU 230 (H) 11/29/2021    CALCIUM 8.8 11/29/2021    MG 3.5 (H) 11/29/2021    PHOS 5.2 (H) 11/29/2021       Lab Results   Component Value Date    BILITOT 0.4 11/29/2021    BILIDIR 0.60 (H) 10/11/2020    PROT 7.9 11/29/2021    ALBUMIN 3.6 11/29/2021    ALT <7 (L) 11/29/2021    AST 23 11/29/2021    ALKPHOS 75 11/29/2021    GGT 215 (H) 09/29/2020       Lab Results   Component Value Date    INR 2.15 07/01/2021    APTT 46.9 (H) 01/10/2021       Pertinent labs & imaging results that were available during my care of the patient were reviewed by me and considered in my medical decision making (see chart for details).    Portions of this record have been created using Scientist, clinical (histocompatibility and immunogenetics). Dictation errors have been sought, but may not have been identified and corrected.    Lucretia Roers, MD  EM     Marlana Latus, MD  Resident  11/30/21 [phone]

## 2021-11-30 NOTE — Unmapped (Signed)
OCCUPATIONAL THERAPY  Evaluation (11/30/21 0855)    Patient Name:  Barbara Huber       Medical Record Number: [id number]   Date of Birth: 04-28-1941  Sex: Female          OT Treatment Diagnosis:  B UE tremors affecting pt's ability to complete ADLs    Problem List: Decreased endurance, Fall risk, Impaired balance, Decreased coordination, Impaired ADLs      Assessment: Shalisha Stratmann is a 80 y.o. 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 Pt with occupational deficits in ADLs, functional transfers, and functional mobility. Pt currently experiencing B UE tremors and jerking that affect her ability to complete self-care independently. Pt daughter states there is usually a family member available at home throughout the day. Based on consideration of pt's occupational profile, assessment review, level of clinical decision making involved, and intervention plan, this pt is considered to be a moderate complexity case.  Today's Interventions: Other  Today's Interventions: supine to sit, sit <> stand, LB dressing, toileting, toileting transfers, grooming, and functional mobility. OT educating pt re: OOB with assistance, safe transfer techniques, safety awareness, post-acute recs, role of OT, and OT POC.    Activity Tolerance During Today's Session  Tolerated treatment well    Plan  Planned Frequency of Treatment:  1-2x per day for: 2-3x week  Planned Treatment Duration: 12/14/21    Planned Interventions:  ADL retraining, Compensatory tech. training, Bed mobility, Balance activities, Environmental support, Functional cognition, Conservation, Home exercise program, Education - Patient, Functional mobility, Endurance activities, Education - Family / caregiver, [employer], Teacher, early years/pre, UE Strength / coordination exercise, Safety education, Therapeutic exercise    Post-Discharge Occupational Therapy Recommendations:   3x weekly   OT DME Recommendations: Defer to post acute -        GOALS:   Patient and Family Goals: to go home    IP Long Term Goal #1: Pt will score 22/24 on AMPAC in 4 weeks       Short Term:  Pt to complete full body dressing with Mod I using LRAD with no balance concerns   Time Frame : 2 weeks  Pt to complete toileting with Mod I using LRAD with no balance concerns   Time Frame : 2 weeks  Pt to complete +2 grooming tasks standing at sink independently using LRAD with no balance concerns   Time Frame : 2 weeks  Pt will complete toilet transfers with mod I   Time Frame : 2 weeks           Prognosis:  Good  Positive Indicators:  PLOF and support from family  Barriers to Discharge: Endurance deficits    Subjective  Current Status Pt recieved supien in bed. Pt left seated in bedside chair with call needs in reach. RN aware. Daugther present.  Prior Functional Status Pt reports being (I) in ADLs and mod I for IADLs with daughters assistance for cooking and house keeping. Pt is able to manage her own mediation and per daugther is able to cook her own breakfast. Pt uses a rollator at baseline. At baseline pt is on 3L [place] at rest and 4L for functional mobility. Pt  endorsing one fall in the past 6 months.  Medical Tests / Procedures: Reviewed in Epic.       Patient / Caregiver reports: I feel good    Past Medical History:   Diagnosis Date   ??? Acute kidney injury superimposed on chronic kidney disease (CMS-HCC) 10/11/2020   ??? Acute on chronic diastolic (congestive) heart failure (CMS-HCC) 08/23/2020   ??? Arthritis    ??? Calculus of kidney    ??? Calculus of ureter    ??? CHF (congestive heart failure) (CMS-HCC)    ??? Chronic atrial fibrillation (CMS-HCC) 07/20/2019   ??? COPD (chronic obstructive pulmonary disease) (CMS-HCC)    ??? Diabetes (CMS-HCC)    ??? Gangrenous cholecystitis 10/11/2020   ??? GERD (gastroesophageal reflux disease)    ??? Hydronephrosis    ??? Hypertension    ??? Hyponatremia 10/11/2020   ??? Intermediate coronary syndrome (CMS-HCC) 03/13/2014   ??? Lower extremity edema 09/28/2020   ??? Lumbar stenosis    ??? Microscopic hematuria    ??? Nausea alone    ??? Nephrolithiasis 04/17/2016   ??? Neuropathy    ??? Nocturia    ??? Other chronic cystitis    ??? Pulmonary hypertension (CMS-HCC)    ??? Renal colic    ??? Sleep apnea    ??? Unstable angina pectoris (CMS-HCC) 03/13/2014    Social History     Tobacco Use   ??? Smoking status: Former     Packs/day: 0.33     Types: Cigarettes     Quit date: 2019     Years since quitting: 3.9   ??? Smokeless tobacco: Never   ??? Tobacco comments:     Quit a few years ago   Substance Use Topics   ??? Alcohol use: No      Past Surgical History:   Procedure Laterality Date   ??? BACK SURGERY  1995   ??? CARPAL TUNNEL RELEASE Left 2014   ??? HYSTERECTOMY  1971   ??? IR INSERT CHOLECYSTOSMY TUBE PERCUTANEOUS  10/02/2020    IR INSERT CHOLECYSTOSMY TUBE PERCUTANEOUS 10/02/2020 Braulio Conte, MD IMG VIR [facility]   ??? LUMBAR DISC SURGERY     ??? PR REMOVAL GALLBLADDER N/A 10/06/2020    Procedure: CHOLECYSTECTOMY;  Surgeon: Katherina Mires, MD;  Location: MAIN OR [facility];  Service: Trauma   ??? PR RIGHT HEART CATH O2 SATURATION & CARDIAC OUTPUT N/A 09/30/2020    Procedure: Right Heart Catheterization;  Surgeon: Neal Dy, MD;  Location: [facility] CATH;  Service: Cardiology   ??? PR RIGHT HEART CATH O2 SATURATION & CARDIAC OUTPUT N/A 01/09/2021    Procedure: Right Heart Catheterization;  Surgeon: Lesle Reek, MD;  Location: [facility] CATH;  Service: Cardiology    Family History   Problem Relation Age of Onset   ??? Hypertension Mother    ??? Cancer Father         COLON CANCER   ??? Anesthesia problems Neg Hx    ??? Broken bones Neg Hx    ??? Clotting disorder Neg Hx    ??? Collagen disease Neg Hx    ??? Diabetes Neg Hx    ??? Dislocations Neg Hx    ??? Fibromyalgia Neg Hx    ??? Gout Neg Hx ??? Hemophilia Neg Hx    ??? Osteoporosis Neg Hx    ??? Rheumatologic disease Neg Hx    ??? Scoliosis Neg Hx    ??? Severe sprains Neg Hx    ??? Sickle cell anemia Neg Hx    ??? Spinal Compression Fracture Neg  Hx    ??? GU problems Neg Hx    ??? Kidney cancer Neg Hx    ??? Prostate cancer Neg Hx         Nitrofurantoin and Lipitor [atorvastatin]     Objective Findings  Precautions / Restrictions  Falls precautions    Weight Bearing  Non-applicable    Required Braces or Orthoses  Non-applicable    Communication Preference       Pain  Pt denies pain    Equipment / Environment  Patient not wearing mask for full session, Vascular access (PIV, TLC, Port-a-cath, PICC), Supplemental oxygen (3L)    Living Situation  Living Environment: House  Lives With: Daughter, Family (son in law is home during the day)  Home Living: One level home, Stairs to enter with rails, Walk-in shower, Grab bars in shower, Shower chair with back, Grab bars around toilet, Handicapped height toilet  Rail placement (outside): Bilateral rails  Number of Stairs to [employer] (outside): 2  Equipment available at home: Rollator, Air traffic controller chair with back (transport)     Cognition   Orientation Level:  Oriented x 4   Arousal/Alertness:  Appropriate responses to stimuli   Attention Span:  Appears intact   Memory:  Appears intact   Following Commands:  Follows all commands and directions without difficulty   Safety Judgment:  Good awareness of safety precautions    Vision / Hearing   Vision: Wears glasses all the time   Hearing: No deficit identified       Hand Function:  Right Hand Function: Right hand function impaired  Right Hand Impairment: grip strength good, coordination impaired, new onset tremor  Left Hand Function: Left hand function impaired  Left Hand Impairment: grip strength good, coordination impaired, new onset tremor  Hand Function comments: B UE tremors and jerking movements affecting pt's fine and gross motor coordination. Pt endorses holding/dropping objects and having difficulty with self-feeding    Skin Inspection:  Skin Inspection: Intact where visualized    ROM / Strength:  UE ROM/Strength: Left WFL, Right WFL  LE ROM/Strength: Left WFL, Right WFL    Coordination:  Coordination: WFL  Coordination comment: B UE jerking movements impairing fine and gross motor coondination    Sensation:  Sensory/ Proprioception/ Stereognosis comments: B UE neuropathy    Balance:  independent for static and dynamic sitting balance; SBA for static and dynamic standing    Functional Mobility  Transfer Assistance Needed: Yes  Bed Mobility Assistance Needed:  (unable to formally assess)  Ambulation: Pt completed functional mobility at moderate household distances with SBA + RW. Pt c/o fatigue and required 4L of O2 via [place] to maintain SpO2 >88% for the remainder of the session.      ADLs  ADLs: Needs assistance with ADLs  ADLs - Needs Assistance: LB dressing, Toileting, Grooming, Bathing, Feeding, UB dressing  Feeding - Needs Assistance:  (SBA 2/2 tremors)  UB Dressing - Needs Assistance:  (SBA)  LB Dressing - Needs Assistance: Min assist      Vitals / Orthostatics  At Rest: NAD  With Activity: SpO2: 91% on 3L [place]; Pt desating to 84% with upright mobility and improving to 95% on 4L with activity. Pt left on 3L [place] sating at 93%  Vitals/Orthostatics: asymptomatic      Medical Staff Made Aware: RN updated and aware      Occupational Therapy Session Duration  OT Individual [mins]: 10  OT Co-Treatment [mins]: 33  Reason for Co-treatment: To safely progress  mobility (with Scarlette Shorts, PT)         I attest that I have reviewed the above information.  Signed: Bonnita Nasuti, OT  Filed 11/30/2021

## 2021-11-30 NOTE — Unmapped (Signed)
Transfer/new admission from ED, daughter at bedside. Patient stable on 3L [place], uses home oxygen at this flow rate. Purewick in place to track strict I & O, patient still reporting spastic movements to BUE. Pending PT/OT consult today and continued diuresis.     Problem: Adult Inpatient Plan of Care  Goal: Plan of Care Review  Outcome: Ongoing - Unchanged  Flowsheets (Taken 11/30/2021 0440)  Progress: no change  Plan of Care Reviewed With:   patient   daughter  Goal: Absence of Hospital-Acquired Illness or Injury  Outcome: Ongoing - Unchanged  Goal: Optimal Comfort and Wellbeing  Outcome: Ongoing - Unchanged  Goal: Readiness for Transition of Care  Outcome: Ongoing - Unchanged     Problem: Respiratory Compromise (Heart Failure)  Goal: Effective Oxygenation and Ventilation  Outcome: Ongoing - Unchanged     Problem: Sleep Disordered Breathing (Heart Failure)  Goal: Effective Breathing Pattern During Sleep  Outcome: Ongoing - Unchanged     Problem: Fall Injury Risk  Goal: Absence of Fall and Fall-Related Injury  Outcome: Ongoing - Unchanged     Problem: Infection COPD (Chronic Obstructive Pulmonary Disease)  Goal: Absence of Infection Signs and Symptoms  Outcome: Ongoing - Unchanged     Problem: Respiratory Compromise COPD (Chronic Obstructive Pulmonary Disease)  Goal: Effective Oxygenation and Ventilation  Outcome: Ongoing - Unchanged

## 2021-11-30 NOTE — Unmapped (Signed)
[facility] Medicine   History and Physical    Assessment/Plan:    Principal Problem:    Acute on chronic diastolic congestive heart failure (CMS-HCC)  Active Problems:    Spinal stenosis of lumbar region    COPD (chronic obstructive pulmonary disease) (CMS-HCC)    Sleep apnea in adult    Essential hypertension    AKI (acute kidney injury) (CMS-HCC)    Type 2 diabetes mellitus with stage 3b chronic kidney disease, without long-term current use of insulin (CMS-HCC)    Chronic cystitis    Peripheral neuropathy    Chronic respiratory failure with hypoxia (CMS-HCC)    Anemia in chronic kidney disease    Pulmonary hypertension (CMS-HCC)    Chronic prescription opiate use    Dyspnea on exertion    Cardiac amyloidosis (CMS-HCC)    Dyspepsia    Iron deficiency anemia due to chronic blood loss      Barbara Huber is a 80 y.o. female with PMHx of [name] due to cardiac amyloidosis, DM2, [name], DM2, HTN, HLD, pulmonary HTN, COPD on baseline 3L of oxygen at home, OSA on AVAPS who presents to [facility] with Acute on chronic diastolic congestive heart failure (CMS-HCC) as well as 2 weeks of worsening upper extremity myoclonic jerks.       [name] Exacerbation: She reports that her weight has been around 218-219 though her understanding is that her dry weight should be around 214. She has tried taking a dose of metolazone earlier in the week and also increasing her afternoon torsemide from 20 to 40 with minimal response. She somewhat more short of breath than baseline, especially with exertion. LE edema is stable to her. On labs her BNP is up from baseline. Troponin elevated as well though this is likely chronic for her.   - S/p Lasix 40 IV in the ED  - Will add metolazone 5mg   - BID lasix with BID labs  - Replete Mg and K to cardiac parameters  - Strict I&O  - Daily weights    AKI on [name]: Creatinine increased to 3.15 from a baseline around 2.5 also with azotemia and mild hyperkalemia. I suspect that this is most likely related to a cardio-renal process, however could also be related to her recently increased dose of diuretics. Will plan to lasix challenge and follow her creatinine.   - Daily creatinine, lytes BID while diuresing     Myoclonic Jerks: These are apparent on exam and very distressing to the patient, to the point of making it difficult to achieve any of her ADLs.Trial of decreasing amiodarone with Cadio APP did not improve symptoms. Unclear cause at this time. Consider medication effect vs. Toxic metabolic effect from azotemia given elevated creatinine.   - Trial of diuresis to see if this addresses her AKI (if it is indeed cardio-renal in nature). Hope that as renal function improves so will her symptoms  - Check TSH, ammonia (pending at this time)  - Discuss possible medication causes with pharmacy     Elevated HS Troponin: This appears to be similar to prior levels and likely chronic, on review of prior labs. Renal dysfunction may also be contributing.      COPD: On 3L at home at baseline. No evidence of exacerbation at this time  - Continue formulary equivalents of home Spiriva and Advair  - PRN albuterol if needed    OSA on AVAPS: May use home machine, she has brought it with her.     Atrial Fibrillation:  Currently in sinus.   - Continue amiodarone  - Continue apixaban 2.4 BID    Type II DM: Sliding scale insulin while admitted    Chronic Pain: Continue home oxycontin, verified in PDMP database     Cardiac Amyloidosis: Continue home Tafamidis 61 mg daily     Recurrent Cystitis: She is on weekly fosfomycin. No symptoms today.     HLD: Holding home rosuvastatin. She has a bad reaction to atorvastatin which is the formulary equivalent so will not order this      Code Status:  Full Code, discussed with patient and daughter on admission. Her daughter Devota Pace would be her HCDM if needed [phone].   ___________________________________________________________________    Chief Complaint  Chief Complaint   Patient presents with   ??? Shortness of Breath       HPI:  Barbara Huber is a 80 y.o. 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     [name] reports that her primary reason for coming to the ED has been significantly worsening jerking tremors of her upper extremities. She reports that they started 2 weeks ago and were initially more mild. She mentioned them to her cardiologist, who recommended decreasing amiodarone dose from 200 to 100 to see if this improved. When then did not over the course of 2 weeks she went back to her old dose. The tremors have gotten progressively worse to the point where she cannot hold anything reliably and they are impacting her stability in walking. She reports a near fall off the toilet yesterday and inability to get up related to the tremors. As she was getting in the car to come to the ED for this issue she started to get significantly short of breath, more so than her baseline and took a long time to recover. She had to turn her home 3L of oxygen up to 3.5 to get some relief.     She reports that overall she has been above her dry weight which she understands to be 214 lbs. She also reports a week of slowly worsening shortness of breath and DOE. She was 219 yesterday and 218 the day before. Earlier in the week she was 222 and took a dose of metolazone that she has available. Her usual torsemide dose is 40 in the AM and 20 in the PM. Over the last 2 days she has increased the PM dose to 40 as well with some improvement in urine output. She denies fever, chills or cough. Appetite is stably poor. She does not restrict salt or water in her diet. Other than the increase in diuretics and trial of lower amiodarone dose she does not recall any other recent medications changes. No additional chest pain.     Allergies:  Nitrofurantoin and Lipitor [atorvastatin]     Medications:   Prior to Admission medications    Medication Dose, Route, Frequency   ACCU-CHEK AVIVA PLUS TEST STRP Strp UESE TO CHECK BLOOD SUGAR 3 TIMES A DAY BEFORE MEALS   acetaminophen (TYLENOL) 500 MG tablet 1,000 mg, Oral, Daily PRN   ADVAIR HFA 115-21 mcg/actuation inhaler INHALE TWO PUFFS BY MOUTH TWICE A DAY   albuterol HFA 90 mcg/actuation inhaler 2 puffs, Inhalation, Every 8 hours PRN  amiodarone (PACERONE) 200 MG tablet 100 mg, Oral, Daily (standard)   apixaban (ELIQUIS) 5 mg Tab 2.5 mg, Oral, 2 times a day (standard)   estradioL (ESTRACE) 0.01 % (0.1 mg/gram) vaginal cream INSERT ONE GRAM VAGINALLY AT BEDTIME   FEROSUL 325 mg (65 mg iron) tablet    fosfomycin (MONUROL) 3 gram Pack 3 g, Oral, Weekly   gabapentin (NEURONTIN) 300 MG capsule 300 mg, Oral, At bedtime   lancets Misc 1 each, Miscellaneous, Daily (standard), Accu Check   metOLazone (ZAROXOLYN) 5 MG tablet 5 mg, Oral, Daily PRN   montelukast (SINGULAIR) 10 mg tablet 10 mg, Oral, Daily   NARCAN 4 mg/actuation nasal spray 1 spray, Alternating Nares, Once as needed, PRN - Emergency use.   NON FORMULARY APPLY FROM NECK DOWN TWICE A DAY   oxyCODONE (OXYCONTIN) 10 mg TR12 12 hr crush resistant ER/CR tablet 10 mg, Oral, Every 12 hours   OXYGEN-AIR DELIVERY SYSTEMS MISC 5 L, Miscellaneous, 3 L/min via [place]   pantoprazole (PROTONIX) 20 MG tablet 20 mg, Oral, Daily (standard)   rosuvastatin (CRESTOR) 5 MG tablet 5 mg, Oral, Every other day   SPIRIVA RESPIMAT 2.5 mcg/actuation inhalation mist INHALE TWO PUFFS BY MOUTH ONCE DAILY   spironolactone (ALDACTONE) 25 MG tablet 25 mg, Oral, Daily (standard)   tafamidis 61 mg cap Take 1 capsule (61 mg) by mouth daily.   torsemide (DEMADEX) 20 MG tablet Take 1 tablet (20 mg total) by mouth 3 (three) times a day. May also take 1 tablet (20 mg total) daily as needed (weight gain).   varicella-zoster [id number], PF, (SHINGRIX, PF,) 50 mcg/0.5 mL SusR injection 0.5 mL, Intramuscular       Medical History:  Past Medical History:   Diagnosis Date   ??? Acute kidney injury superimposed on chronic kidney disease (CMS-HCC) 10/11/2020   ??? Acute on chronic diastolic (congestive) heart failure (CMS-HCC) 08/23/2020   ??? Arthritis    ??? Calculus of kidney    ??? Calculus of ureter    ??? CHF (congestive heart failure) (CMS-HCC)    ??? Chronic atrial fibrillation (CMS-HCC) 07/20/2019   ??? COPD (chronic obstructive pulmonary disease) (CMS-HCC)    ??? Diabetes (CMS-HCC)    ??? Gangrenous cholecystitis 10/11/2020   ??? GERD (gastroesophageal reflux disease)    ??? Hydronephrosis    ??? Hypertension    ??? Hyponatremia 10/11/2020   ??? Intermediate coronary syndrome (CMS-HCC) 03/13/2014   ??? Lower extremity edema 09/28/2020   ??? Lumbar stenosis    ??? Microscopic hematuria    ??? Nausea alone    ??? Nephrolithiasis 04/17/2016   ??? Neuropathy    ??? Nocturia    ??? Other chronic cystitis    ??? Pulmonary hypertension (CMS-HCC)    ??? Renal colic    ??? Sleep apnea    ??? Unstable angina pectoris (CMS-HCC) 03/13/2014       Surgical History:  Past Surgical History:   Procedure Laterality Date   ??? BACK SURGERY  1995   ??? CARPAL TUNNEL RELEASE Left 2014   ??? HYSTERECTOMY  1971   ??? IR INSERT CHOLECYSTOSMY TUBE PERCUTANEOUS  10/02/2020    IR INSERT CHOLECYSTOSMY TUBE PERCUTANEOUS 10/02/2020 Braulio Conte, MD IMG VIR [facility]   ??? LUMBAR DISC SURGERY     ??? PR REMOVAL GALLBLADDER N/A 10/06/2020    Procedure: CHOLECYSTECTOMY;  Surgeon: Katherina Mires, MD;  Location: MAIN [facility];  Service: Trauma   ??? PR RIGHT HEART CATH O2 SATURATION & CARDIAC OUTPUT N/A  09/30/2020    Procedure: Right Heart Catheterization;  Surgeon: Neal Dy, MD;  Location: [facility] CATH;  Service: Cardiology   ??? PR RIGHT HEART CATH O2 SATURATION & CARDIAC OUTPUT N/A 01/09/2021    Procedure: Right Heart Catheterization;  Surgeon: Lesle Reek, MD;  Location: [facility] CATH;  Service: Cardiology       Social History:  Social History     Socioeconomic History   ??? Marital status: Widowed   Occupational History   ??? Occupation: retired   Tobacco Use   ??? Smoking status: Former     Packs/day: 0.33     Types: Cigarettes     Quit date: 2019     Years since quitting: 3.9   ??? Smokeless tobacco: Never   ??? Tobacco comments:     Quit a few years ago   Vaping Use   ??? Vaping Use: Never used   Substance and Sexual Activity   ??? Alcohol use: No   ??? Drug use: No   Other Topics Concern   ??? Exercise No   Social History Narrative    Previously worked in Designer, fashion/clothing, retired.  Daughter is Economist who works at [employer].  Her niece is Selena Batten, [employer] Anesthesia Tech.  Lives in [place] with daughter and son-in-law.     Social Determinants of Health     Financial Resource Strain: Low Risk    ??? Difficulty of Paying Living Expenses: Not hard at all   Food Insecurity: No Food Insecurity   ??? Worried About Programme researcher, broadcasting/film/video in the Last Year: Never true   ??? Ran Out of Food in the Last Year: Never true   Transportation Needs: No Transportation Needs   ??? Lack of Transportation (Medical): No   ??? Lack of Transportation (Non-Medical): No       Family History:  Family History   Problem Relation Age of Onset   ??? Hypertension Mother    ??? Cancer Father         COLON CANCER   ??? Anesthesia problems Neg Hx    ??? Broken bones Neg Hx    ??? Clotting disorder Neg Hx    ??? Collagen disease Neg Hx    ??? Diabetes Neg Hx    ??? Dislocations Neg Hx    ??? Fibromyalgia Neg Hx    ??? Gout Neg Hx    ??? Hemophilia Neg Hx    ??? Osteoporosis Neg Hx    ??? Rheumatologic disease Neg Hx    ??? Scoliosis Neg Hx    ??? Severe sprains Neg Hx    ??? Sickle cell anemia Neg Hx    ??? Spinal Compression Fracture Neg Hx    ??? GU problems Neg Hx    ??? Kidney cancer Neg Hx    ??? Prostate cancer Neg Hx        Review of Systems:  10 systems reviewed and are negative unless otherwise mentioned in HPI      Physical Exam:  Temp:  [37 ??C (98.6 ??F)] 37 ??C (98.6 ??F)  Heart Rate:  [59-63] 59  SpO2 Pulse: [64] 64  Resp:  [10-13] 13  BP: (110-119)/(55-65) 116/61  SpO2:  [87 %-96 %] 95 %  There is no height or weight on file to calculate BMI.      General: No acute distress, alert, oriented and communicative  HEENT: EOMI, JVD difficult to assess  Respiratory: Lungs CTAB, mild crackles at both bases  Cardiovascular: Regular rate  and rhythm, no murmurs, rubs or gallops  Gastrointestinal: Bowel sounds present, Abdomen non-tender to palpation in all quadrants  Extremities: 2_ symmetric LE edema, distal pulses palpable   MSK: No joint or limb deformities, no muscle tenderness to palpation  Skin: No diaphoresis, no rashes or wounds on examined skin   Neuro: Alert and oriented, no facial droop, moving all extremities symmetrically, bilateral upper extremities with visible muscular twitches at rest and with movement      Test Results:  Data Review:    All lab results last 24 hours:    Recent Results (from the past 24 hour(s))   Comprehensive Metabolic Panel    Collection Time: 11/29/21  7:51 PM   Result Value Ref Range    Sodium 132 (L) 135 - 145 mmol/L    Potassium 4.9 (H) 3.4 - 4.8 mmol/L    Chloride 89 (L) 98 - 107 mmol/L    CO2 33.0 (H) 20.0 - 31.0 mmol/L    Anion Gap 10 5 - 14 mmol/L    BUN 86 (H) 9 - 23 mg/dL    Creatinine 0.98 (H) 0.60 - 0.80 mg/dL    BUN/Creatinine Ratio 27     eGFR [name]-EPI (2021) Female 14 (L) >=60 mL/min/1.88m2    Glucose 230 (H) 70 - 179 mg/dL    Calcium 8.8 8.7 - 11.9 mg/dL    Albumin 3.6 3.4 - 5.0 g/dL    Total Protein 7.9 5.7 - 8.2 g/dL    Total Bilirubin 0.4 0.3 - 1.2 mg/dL    AST 23 <=14 U/L    ALT <7 (L) 10 - 49 U/L    Alkaline Phosphatase 75 46 - 116 U/L   Magnesium Level    Collection Time: 11/29/21  7:51 PM   Result Value Ref Range    Magnesium 3.5 (H) 1.6 - 2.6 mg/dL   Phosphorus Level    Collection Time: 11/29/21  7:51 PM   Result Value Ref Range    Phosphorus 5.2 (H) 2.4 - 5.1 mg/dL   CBC w/ Differential    Collection Time: 11/29/21  7:51 PM   Result Value Ref Range    WBC 8.6 3.6 - 11.2 10*9/L    RBC 3.73 (L) 3.95 - 5.13 10*12/L    HGB 8.8 (L) 11.3 - 14.9 g/dL    HCT 78.2 (L) 95.6 - 44.0 %    MCV 76.8 (L) 77.6 - 95.7 fL    MCH 23.6 (L) 25.9 - 32.4 pg    MCHC 30.7 (L) 32.0 - 36.0 g/dL    RDW 21.3 (H) 08.6 - 15.2 %    MPV 8.6 6.8 - 10.7 fL    Platelet 314 150 - 450 10*9/L    Neutrophils % 76.9 %    Lymphocytes % 8.3 %    Monocytes % 12.2 %    Eosinophils % 1.1 %    Basophils % 1.5 %    Absolute Neutrophils 6.6 1.8 - 7.8 10*9/L    Absolute Lymphocytes 0.7 (L) 1.1 - 3.6 10*9/L    Absolute Monocytes 1.1 (H) 0.3 - 0.8 10*9/L    Absolute Eosinophils 0.1 0.0 - 0.5 10*9/L    Absolute Basophils 0.1 0.0 - 0.1 10*9/L    Microcytosis Slight (A) Not Present    Anisocytosis Slight (A) Not Present    Hypochromasia Marked (A) Not Present   hsTroponin I (single, no delta)    Collection Time: 11/29/21  7:51 PM   Result Value Ref Range  hsTroponin I 63 (HH) <=34 ng/L   Morphology Review    Collection Time: 11/29/21  7:51 PM   Result Value Ref Range    Smear Review Comments See Comment (A) Undefined   Respiratory Pathogen Panel with COVID-19    Collection Time: 11/29/21  7:52 PM   Result Value Ref Range    Adenovirus Not Detected Not Detected    Coronavirus HKU1 Not Detected Not Detected    Coronavirus NL63 Not Detected Not Detected    Coronavirus 229E Not Detected Not Detected    Coronavirus OC43 PCR Not Detected Not Detected    Metapneumovirus Not Detected Not Detected    Rhinovirus/Enterovirus Not Detected Not Detected    Influenza A Not Detected Not Detected    Influenza B Not Detected Not Detected    Parainfluenza 1 Not Detected Not Detected    Parainfluenza 2 Not Detected Not Detected    Parainfluenza 3 Not Detected Not Detected    Parainfluenza 4 Not Detected Not Detected    RSV Not Detected Not Detected    Bordetella pertussis Not Detected Not Detected    Bordetella parapertussis Not Detected Not Detected    Chlamydophila (Chlamydia) pneumoniae Not Detected Not Detected    Mycoplasma pneumoniae Not Detected Not Detected    SARS-CoV-2 PCR Not Detected Not Detected   B-type natriuretic peptide    Collection Time: 11/29/21  8:34 PM   Result Value Ref Range    BNP 622 (H) <=100 pg/mL   ECG 12 Lead    Collection Time: 11/29/21  8:51 PM   Result Value Ref Range    EKG Systolic BP  mmHg    EKG Diastolic BP  mmHg    EKG Ventricular Rate 65 BPM    EKG Atrial Rate 65 BPM    EKG P-R Interval 212 ms    EKG QRS Duration 92 ms    EKG Q-T Interval 450 ms    EKG QTC Calculation 468 ms    EKG Calculated P Axis 64 degrees    EKG Calculated R Axis 4 degrees    EKG Calculated T Axis 90 degrees    QTC Fredericia 462 ms   Urinalysis with Culture Reflex    Collection Time: 11/29/21  8:56 PM    Specimen: Clean Catch; Urine   Result Value Ref Range    Color, UA Light Yellow     Clarity, UA Turbid     Specific Gravity, UA 1.010 1.003 - 1.030    pH, UA 6.0 5.0 - 9.0    Leukocyte Esterase, UA Trace (A) Negative    Nitrite, UA Negative Negative    Protein, UA Negative Negative    Glucose, UA Negative Negative    Ketones, UA Negative Negative    Urobilinogen, UA <2.0 mg/dL <1.6 mg/dL    Bilirubin, UA Negative Negative    Blood, UA Negative Negative    RBC, UA <1 <=4 /HPF    WBC, UA 2 0 - 5 /HPF    Squam Epithel, UA 3 0 - 5 /HPF    Bacteria, UA Rare (A) None Seen /HPF    Hyaline Casts, UA 6 (H) 0 - 1 /LPF    Mucus, UA Rare (A) None Seen /HPF   hsTroponin I (serial 2-6H CONTINUATION w/ delta)    Collection Time: 11/30/21 12:16 AM   Result Value Ref Range    hsTroponin I 65 (HH) <=34 ng/L    delta hsTroponin I 2 <=7 ng/L  Imaging: Radiology studies were personally reviewed and ECG 12 Lead    Result Date: 11/29/2021  SINUS RHYTHM WITH SINUS ARRHYTHMIA WITH 1ST DEGREE AV BLOCK LOW VOLTAGE QRS NONSPECIFIC ST AND T WAVE ABNORMALITY ABNORMAL ECG WHEN COMPARED WITH ECG OF 03-Jul-2021 15:38, QUESTIONABLE CHANGE IN QRS AXIS NONSPECIFIC T WAVE ABNORMALITY, WORSE IN INFERIOR LEADS NONSPECIFIC T WAVE ABNORMALITY NOW EVIDENT IN ANTEROLATERAL LEADS    XR Chest Portable    Result Date: 11/29/2021  EXAM: XR CHEST PORTABLE DATE: 11/29/2021 7:55 PM ACCESSION: [id number] UN DICTATED: 11/29/2021 8:33 PM INTERPRETATION LOCATION: [facility] CLINICAL INDICATION: 80 years old Female with SHORTNESS OF BREATH  COMPARISON: Chest radiograph 06/12/2021 TECHNIQUE: Portable Chest Radiograph. FINDINGS: Bilateral lower lung zone opacities, similar prior study. No large pleural effusion. No pneumothorax. Enlarged cardiomediastinal silhouette, similar to prior.     Bilateral lower lung zone opacities similar to prior study, favored to represent pulmonary edema.      EKG: EKG was personally reviewed and noted to be low voltage QRS.

## 2021-11-30 NOTE — Unmapped (Signed)
Pt c/o muscle twitching and worsening shortness of breath. Pt has increased oxygen demand. Normally 3 L/min, increased to 3.5 L/min.

## 2021-11-30 NOTE — Unmapped (Signed)
Bed: 70-D  Expected date:   Expected time:   Means of arrival:   Comments:  Madilyn Fireman

## 2021-11-30 NOTE — Unmapped (Signed)
PHYSICAL THERAPY  Evaluation (11/30/21 0856)          Patient Name:?? Barbara Huber????????   Medical Record Number: [id number]   Date of Birth: 08/18/1941  Sex: Female??  ??    Treatment Diagnosis: Decreased endurance, decreased mobility     Activity Tolerance: Tolerated treatment well     ASSESSMENT  Problem List: Decreased endurance, Decreased mobility, Fall risk, Gait deviation, Impaired balance      Assessment : Barbara Huber is a 80 y.o. 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 Patient with pertinent PMHx above who presents to PT with associated deficits in mobility and endurance. Patient able to complete all functional mobility with SBA-brief min A but displays slow gait speed and increased need for RW 2/2 oxygen needs in setting of COPD. Patient able to grip RW during ambulation despite recent onset of UE myoclonic jerks, but reports difficulty with fine motor skills. Recommeding post-acute PT frequency of 3x to progress deficits needed to safely return to PLOF.     After a review of the personal factors, comorbidities, clinical presentation, and examination of the number of affected body systems, the patient presents as a moderate complexity case.       Today's Interventions: PT evaluation, supine to sit, STS, ambulation, education re: PT role, POC, safe mobility, safe DME use, oxygen management at home, activity pacing via RPE scale            PLAN  Planned Frequency of Treatment:?? 1-2x per day for: 3-4x week       Planned Interventions: Balance activities, Education - Patient, Endurance activities, Functional mobility, Therapeutic exercise, Therapeutic activity, Self-care / Home training, Transfer training     Post-Discharge Physical Therapy Recommendations:?? 3x weekly     PT DME Recommendations: Walker (rolling)??????????       Goals:         Long Term Goal #1: In 6 weeks, patient will score 20/20 on AMPAC        SHORT GOAL #1: Patient will perform bed mobility indep  ?????????????????????? Time Frame : 2 weeks  SHORT GOAL #2: Patient will perform functional transfers mod I with LRAD  ?????????????????????? Time Frame : 2 weeks  SHORT GOAL #3: Patient will ambulate 200' mod I with LRAD  ?????????????????????? Time Frame : 2 weeks     ??????????????????????       ??????????????????????       Prognosis:?? Good  Positive Indicators: Family support, PLOF  Barriers to Discharge: Endurance deficits     SUBJECTIVE  Patient reports: Patient agreeable to PT  Current Functional Status: Patient received supine, patient left seated in chair, on 3L [place], family present, call Markeith Jue within reach, all immediate needs met     Prior Functional Status: Patient ambulatats with modified independence and rollator, 3L at baseline  Equipment available at home: Rollator, Shower chair with back (transport)      Past Medical History:   Diagnosis Date   ??? Acute kidney injury superimposed on chronic kidney disease (CMS-HCC) 10/11/2020   ??? Acute on chronic diastolic (congestive) heart failure (CMS-HCC) 08/23/2020   ??? Arthritis    ??? Calculus of kidney    ??? Calculus of ureter    ??? CHF (congestive heart failure) (  CMS-HCC)    ??? Chronic atrial fibrillation (CMS-HCC) 07/20/2019   ??? COPD (chronic obstructive pulmonary disease) (CMS-HCC)    ??? Diabetes (CMS-HCC)    ??? Gangrenous cholecystitis 10/11/2020   ??? GERD (gastroesophageal reflux disease)    ??? Hydronephrosis    ??? Hypertension    ??? Hyponatremia 10/11/2020   ??? Intermediate coronary syndrome (CMS-HCC) 03/13/2014   ??? Lower extremity edema 09/28/2020   ??? Lumbar stenosis    ??? Microscopic hematuria    ??? Nausea alone    ??? Nephrolithiasis 04/17/2016   ??? Neuropathy    ??? Nocturia    ??? Other chronic cystitis    ??? Pulmonary hypertension (CMS-HCC)    ??? Renal colic    ??? Sleep apnea    ??? Unstable angina pectoris (CMS-HCC) 03/13/2014            Social History     Tobacco Use   ??? Smoking status: Former     Packs/day: 0.33     Types: Cigarettes     Quit date: 2019     Years since quitting: 3.9   ??? Smokeless tobacco: Never   ??? Tobacco comments:     Quit a few years ago   Substance Use Topics   ??? Alcohol use: No       Past Surgical History:   Procedure Laterality Date   ??? BACK SURGERY  1995   ??? CARPAL TUNNEL RELEASE Left 2014   ??? HYSTERECTOMY  1971   ??? IR INSERT CHOLECYSTOSMY TUBE PERCUTANEOUS  10/02/2020    IR INSERT CHOLECYSTOSMY TUBE PERCUTANEOUS 10/02/2020 Braulio Conte, MD IMG VIR [facility]   ??? LUMBAR DISC SURGERY     ??? PR REMOVAL GALLBLADDER N/A 10/06/2020    Procedure: CHOLECYSTECTOMY;  Surgeon: Katherina Mires, MD;  Location: MAIN OR [facility];  Service: Trauma   ??? PR RIGHT HEART CATH O2 SATURATION & CARDIAC OUTPUT N/A 09/30/2020    Procedure: Right Heart Catheterization;  Surgeon: Neal Dy, MD;  Location: [facility] CATH;  Service: Cardiology   ??? PR RIGHT HEART CATH O2 SATURATION & CARDIAC OUTPUT N/A 01/09/2021    Procedure: Right Heart Catheterization;  Surgeon: Lesle Reek, MD;  Location: [facility] CATH;  Service: Cardiology             Family History   Problem Relation Age of Onset   ??? Hypertension Mother    ??? Cancer Father         COLON CANCER   ??? Anesthesia problems Neg Hx    ??? Broken bones Neg Hx    ??? Clotting disorder Neg Hx    ??? Collagen disease Neg Hx    ??? Diabetes Neg Hx    ??? Dislocations Neg Hx    ??? Fibromyalgia Neg Hx    ??? Gout Neg Hx    ??? Hemophilia Neg Hx    ??? Osteoporosis Neg Hx    ??? Rheumatologic disease Neg Hx    ??? Scoliosis Neg Hx    ??? Severe sprains Neg Hx    ??? Sickle cell anemia Neg Hx    ??? Spinal Compression Fracture Neg Hx    ??? GU problems Neg Hx    ??? Kidney cancer Neg Hx    ??? Prostate cancer Neg Hx         Allergies: Nitrofurantoin and Lipitor [atorvastatin]           Objective Findings  Precautions / Restrictions  Precautions: Falls precautions  Weight  Bearing Status: Non-applicable  Required Braces or Orthoses: Non-applicable     Communication Preference: Verbal          Pain Comments: Denies pain  Medical Tests / Procedures: Labs, orders, vitals reviewed in Epic  Equipment / Environment: Patient not wearing mask for full session, Vascular access (PIV, TLC, Port-a-cath, PICC), Supplemental oxygen (3L)     At Rest: 92% SpO2 on 3L  With Activity: 86% SpO2 on 3L, stabilized to 92% on 4L           Living Situation  Living Environment: House  Lives With: Daughter, Family (son in law is home during the day)  Home Living: One level home, Stairs to enter with rails, Walk-in shower, Grab bars in shower, Shower chair with back, Grab bars around toilet, Handicapped height toilet  Rail placement (outside): Bilateral rails  Number of Stairs to Enter (outside): 2      Cognition: WFL  Visual/Perception: Wears Glasses/Contacts     Skin Inspection: Intact where visualized     Upper Extremities  UE ROM: Right WFL, Left WFL  UE Strength: Right WFL, Left WFL    Lower Extremities  LE ROM: Right WFL, Left WFL  LE Strength: Right WFL, Left WFL     Sensation: WFL  Balance comment: static sitting EOB supervision, dynamic standing SBA but briefly min A for lateral LOB with RW  Posture comment: forward flexed trunk      Bed Mobility: supine to sit SBA, increased time, HOB elevated     Transfer comments: STS with RW SBA, no LOB      Gait: Patient ambulated 150' (75+75) with RW and slow gait speed, decreased step length bilaterally but displays recirprocal gait pattern     Stairs: NT          Physical Therapy Session Duration  PT Individual [mins]: 8  PT Co-Treatment [mins]: 25  Reason for Co-treatment: To safely progress mobility (Seen with Bonnita Nasuti, OT)     Medical Staff Made Aware: RN     I attest that I have reviewed the above information.  Signed: Rise Paganini, PT  Filed 11/30/2021

## 2021-11-30 NOTE — Unmapped (Signed)
Care Management  Initial Transition Planning Assessment              General  Care Manager assessed the patient by : In person interview with patient, In person interview with family  Orientation Level: Oriented X4  Functional level prior to admission: Independent    Contact/Decision Maker  Extended Emergency Contact Information  Primary Emergency Contact: [name],[name]  Address: [address]           [place], [place] [address] [place]  Home Phone: [phone]  Work Phone: [phone]  Mobile Phone: [phone]  Relation: Daughter    Legal Next of Kin / Guardian / POA / Advance Directives     HCDM (patient stated preference): [name],[name] - Daughter - [phone]    Advance Directive (Medical Treatment)  Does patient have an advance directive covering medical treatment?: Patient has advance directive covering medical treatment, copy in chart.    Health Care Decision Maker [HCDM] (Medical & Mental Health Treatment)  Healthcare Decision Maker: HCDM documented in the HCDM/Contact Info section.  Information offered on HCDM, Medical & Mental Health advance directives:: Patient declined information.         Readmission Information    Have you been hospitalized in the last 30 days?: No                              Patient Information  Lives with: Family members    Type of Residence: Private residence        Location/Detail: [address]   [place] [address]    Support Systems/Concerns: Family Members    Responsibilities/Dependents at home?: No    Home Care services in place prior to admission?: No                  Equipment Currently Used at Home: oxygen, walker, rolling  Current HME Agency (Name/Phone #): Patient has Oxygen Baseline 3.5L [place] with [employer].    Currently receiving outpatient dialysis?: No       Financial Information       Need for financial assistance?: No         Social Determinants of Health     Food Insecurity: No Food Insecurity   ??? Worried About Running Out of Food in the Last Year: Never true   ??? Ran Out of Food in the Last Year: Never true   Tobacco Use: Medium Risk   ??? Smoking Tobacco Use: Former   ??? Smokeless Tobacco Use: Never   ??? Passive Exposure: Not on file   Transportation Needs: No Transportation Needs   ??? Lack of Transportation (Medical): No   ??? Lack of Transportation (Non-Medical): No   Alcohol Use: Not At Risk   ??? How often do you have a drink containing alcohol?: Never   ??? How many drinks containing alcohol do you have on a typical day when you are drinking?: 1 - 2   ??? How often do you have 5 or more drinks on one occasion?: Never   Housing/Utilities: Low Risk    ??? Within the past 12 months, have you ever stayed: outside, in a car, in a tent, in an overnight shelter, or temporarily in someone else's home (i.e. couch-surfing)?: No   ??? Are you worried about losing your housing?: No   ??? Within the past 12 months, have you been unable to get utilities (heat, electricity) when it  was really needed?: No   Substance Use: Low Risk    ??? Taken prescription drugs for non-medical reasons: Never   ??? Taken illegal drugs: Never   ??? Patient indicated they have taken drugs in the past year for non-medical reasons: Yes, [positive answer(s)]: Not on file   Financial Resource Strain: Low Risk    ??? Difficulty of Paying Living Expenses: Not hard at all   Physical Activity: Not on file   Health Literacy: Medium Risk   ??? : Rarely   Stress: Not on file   Intimate Partner Violence: Not on file   Depression: Not at risk   ??? PHQ-2 Score: 0   Social Connections: Not on file       Complex Discharge Information    Is patient identified as a difficult/complex discharge?: No                    Interventions:       Discharge Needs Assessment  Concerns to be Addressed: discharge planning    Clinical Risk Factors: Multiple Diagnoses (Chronic), > 65    Barriers to taking medications: No    Prior overnight hospital stay or [name] in last 90 days: No              Anticipated Changes Related to Illness: none         Discharge Facility/Level of Care Needs: other (see comments) (TBD)    Readmission  Risk of Unplanned Readmission Score: UNPLANNED READMISSION SCORE: 31.24%  Predictive Model Details          31% (High)  Factor Value    Calculated 11/30/2021 12:03 25% Number of active Rx orders 49    [place] Risk of Unplanned Readmission Model 16% Number of ED visits in last six months 4     11% Number of hospitalizations in last year 3     7% ECG/EKG order present in last 6 months     6% Latest BUN high (77 mg/dL)     5% Imaging order present in last 6 months     5% Age 80     4% Latest hemoglobin low (8.9 g/dL)     4% Phosphorous result present     3% Charlson Comorbidity Index 4     3% Diagnosis of deficiency anemia present     3% Active anticoagulant Rx order present     3% Latest creatinine high (2.91 mg/dL)     3% Diagnosis of renal failure present     1% Future appointment scheduled     1% Active ulcer medication Rx order present     0% Current length of stay 0.041 days      Readmitted Within the Last 30 Days? (No if blank)   Patient at risk for readmission?: No    Discharge Plan  Screen findings are: Discharge planning needs identified or anticipated (Comment). (Check OT/PT recs)    Expected Discharge Date:     Expected Transfer from Critical Care:         Patient and/or family were provided with choice of facilities / services that are available and appropriate to meet post hospital care needs?: Yes   List choices in order highest to lowest preferred, if applicable. : Patient would like to use [facility] they have used them before, no [facility].    Type of Residence: Mailing Address:  [address] [place] [address]  Contacts:    Patient Phone  Number: [phone] (home)         Medical Provider(s): Jacquiline Doe, MD  Reason for Admission: Admitting Diagnosis:  Congestive heart failure, unspecified HF chronicity, unspecified heart failure type (CMS-HCC) [I50.9]  Past Medical History:   has a past medical history of Acute kidney injury superimposed on chronic kidney disease (CMS-HCC) (10/11/2020), Acute on chronic diastolic (congestive) heart failure (CMS-HCC) (08/23/2020), Arthritis, Calculus of kidney, Calculus of ureter, CHF (congestive heart failure) (CMS-HCC), Chronic atrial fibrillation (CMS-HCC) (07/20/2019), COPD (chronic obstructive pulmonary disease) (CMS-HCC), Diabetes (CMS-HCC), Gangrenous cholecystitis (10/11/2020), GERD (gastroesophageal reflux disease), Hydronephrosis, Hypertension, Hyponatremia (10/11/2020), Intermediate coronary syndrome (CMS-HCC) (03/13/2014), Lower extremity edema (09/28/2020), Lumbar stenosis, Microscopic hematuria, Nausea alone, Nephrolithiasis (04/17/2016), Neuropathy, Nocturia, Other chronic cystitis, Pulmonary hypertension (CMS-HCC), Renal colic, Sleep apnea, and Unstable angina pectoris (CMS-HCC) (03/13/2014).  Past Surgical History:   has a past surgical history that includes Carpal tunnel release (Left, 2014); Hysterectomy (1971); Back surgery (1995); Lumbar disc surgery; IR Insert Cholecystosmy Tube Percutaneous (10/02/2020); pr removal gallbladder (N/A, 10/06/2020); pr right heart cath o2 saturation & cardiac output (N/A, 09/30/2020); and pr right heart cath o2 saturation & cardiac output (N/A, 01/09/2021).   Previous admit date: 04/18/2021    Primary Insurance- Payor: HUMANA MEDICARE ADV / Plan: HUMANA GOLD PLUS HMO / Product Type: *No Product type* /   Secondary Insurance - Secondary Insurance  MEDICAID [place]  Prescription Coverage - Yes  Preferred Pharmacy - [facility] [place] [address]  [facility] [place] - [address]  [facility]    Transportation home: Private vehicle            Initial Assessment complete?: Yes

## 2021-12-01 LAB — BASIC METABOLIC PANEL
ANION GAP: 5 mmol/L (ref 5–14)
BLOOD UREA NITROGEN: 78 mg/dL — ABNORMAL HIGH (ref 9–23)
BUN / CREAT RATIO: 29
CALCIUM: 8.8 mg/dL (ref 8.7–10.4)
CHLORIDE: 92 mmol/L — ABNORMAL LOW (ref 98–107)
CO2: 36 mmol/L — ABNORMAL HIGH (ref 20.0–31.0)
CREATININE: 2.69 mg/dL — ABNORMAL HIGH
EGFR CKD-EPI (2021) FEMALE: 17 mL/min/{1.73_m2} — ABNORMAL LOW (ref >=60)
GLUCOSE RANDOM: 117 mg/dL (ref 70–179)
POTASSIUM: 4.5 mmol/L (ref 3.4–4.8)
SODIUM: 133 mmol/L — ABNORMAL LOW (ref 135–145)

## 2021-12-01 LAB — CBC
HEMATOCRIT: 26.4 % — ABNORMAL LOW (ref 34.0–44.0)
HEMOGLOBIN: 8.1 g/dL — ABNORMAL LOW (ref 11.3–14.9)
MEAN CORPUSCULAR HEMOGLOBIN CONC: 30.7 g/dL — ABNORMAL LOW (ref 32.0–36.0)
MEAN CORPUSCULAR HEMOGLOBIN: 23.3 pg — ABNORMAL LOW (ref 25.9–32.4)
MEAN CORPUSCULAR VOLUME: 76.1 fL — ABNORMAL LOW (ref 77.6–95.7)
MEAN PLATELET VOLUME: 8 fL (ref 6.8–10.7)
PLATELET COUNT: 242 10*9/L (ref 150–450)
RED BLOOD CELL COUNT: 3.47 10*12/L — ABNORMAL LOW (ref 3.95–5.13)
RED CELL DISTRIBUTION WIDTH: 17.5 % — ABNORMAL HIGH (ref 12.2–15.2)
WBC ADJUSTED: 6.3 10*9/L (ref 3.6–11.2)

## 2021-12-01 LAB — MAGNESIUM: MAGNESIUM: 3.5 mg/dL — ABNORMAL HIGH (ref 1.6–2.6)

## 2021-12-01 LAB — PHOSPHORUS: PHOSPHORUS: 4.7 mg/dL (ref 2.4–5.1)

## 2021-12-01 MED ADMIN — tafamidis cap 61 mg **PATIENT SUPPLIED**: 61 mg | ORAL | @ 22:00:00

## 2021-12-01 MED ADMIN — apixaban (ELIQUIS) tablet 2.5 mg: 2.5 mg | ORAL | @ 02:00:00

## 2021-12-01 MED ADMIN — apixaban (ELIQUIS) tablet 2.5 mg: 2.5 mg | ORAL | @ 14:00:00

## 2021-12-01 MED ADMIN — furosemide (LASIX) injection 40 mg: 40 mg | INTRAVENOUS | @ 19:00:00

## 2021-12-01 MED ADMIN — montelukast (SINGULAIR) tablet 10 mg: 10 mg | ORAL | @ 14:00:00

## 2021-12-01 MED ADMIN — furosemide (LASIX) injection 40 mg: 40 mg | INTRAVENOUS | @ 10:00:00

## 2021-12-01 MED ADMIN — gabapentin (NEURONTIN) capsule 300 mg: 300 mg | ORAL | @ 02:00:00

## 2021-12-01 MED ADMIN — oxyCODONE (OxyCONTIN) 12 hr crush resistant ER/CR tablet 10 mg: 10 mg | ORAL | @ 14:00:00 | Stop: 2021-12-14

## 2021-12-01 MED ADMIN — fluticasone furoate-vilanteroL (BREO ELLIPTA) 100-25 mcg/dose inhaler 1 puff: 1 | RESPIRATORY_TRACT | @ 14:00:00

## 2021-12-01 MED ADMIN — umeclidinium (INCRUSE ELLIPTA) 62.5 mcg/actuation inhaler 1 puff: 1 | RESPIRATORY_TRACT | @ 14:00:00

## 2021-12-01 MED ADMIN — oxyCODONE (OxyCONTIN) 12 hr crush resistant ER/CR tablet 10 mg: 10 mg | ORAL | @ 02:00:00 | Stop: 2021-12-14

## 2021-12-01 MED ADMIN — insulin lispro (HumaLOG) injection 0-20 Units: 0-20 [IU] | SUBCUTANEOUS | @ 19:00:00

## 2021-12-01 MED ADMIN — insulin lispro (HumaLOG) injection 0-20 Units: 0-20 [IU] | SUBCUTANEOUS | @ 02:00:00

## 2021-12-01 MED ADMIN — amiodarone (PACERONE) tablet 100 mg: 100 mg | ORAL | @ 14:00:00

## 2021-12-01 MED ADMIN — pantoprazole (PROTONIX) EC tablet 20 mg: 20 mg | ORAL | @ 14:00:00

## 2021-12-01 NOTE — Unmapped (Signed)
VSS.   Pt afebrile . With no s/s of infection noted . No falls noted. Pt educated to call for assistance if needed..  Family at bedside.Pt on 3 LNC and on CPAP at night. .  Pain  controlled by scheduled pain meds. No prn pain meds requested. DVT protocol in place.pt on Eliquis. Will continue to monitor.    Problem: Adult Inpatient Plan of Care  Goal: Plan of Care Review  Outcome: Progressing  Goal: Patient-Specific Goal (Individualized)  Outcome: Progressing  Goal: Absence of Hospital-Acquired Illness or Injury  Outcome: Progressing  Intervention: Identify and Manage Fall Risk  Recent Flowsheet Documentation  Taken 11/30/2021 1930 by Darene Lamer, RN  Safety Interventions: commode/urinal/bedpan at bedside  Intervention: Prevent Skin Injury  Recent Flowsheet Documentation  Taken 11/30/2021 2003 by Darene Lamer, RN  Skin Protection: incontinence pads utilized  Taken 11/30/2021 1930 by Darene Lamer, RN  Skin Protection: incontinence pads utilized  Intervention: Prevent and Manage VTE (Venous Thromboembolism) Risk  Recent Flowsheet Documentation  Taken 11/30/2021 1930 by Darene Lamer, RN  Activity Management: activity adjusted per tolerance  Goal: Optimal Comfort and Wellbeing  Outcome: Progressing  Goal: Readiness for Transition of Care  Outcome: Progressing  Goal: Rounds/Family Conference  Outcome: Progressing     Problem: Adjustment to Illness (Heart Failure)  Goal: Optimal Coping  Outcome: Progressing     Problem: Cardiac Output Decreased (Heart Failure)  Goal: Optimal Cardiac Output  Outcome: Progressing     Problem: Dysrhythmia (Heart Failure)  Goal: Stable Heart Rate and Rhythm  Outcome: Progressing     Problem: Fluid Imbalance (Heart Failure)  Goal: Fluid Balance  Outcome: Progressing     Problem: Functional Ability Impaired (Heart Failure)  Goal: Optimal Functional Ability  Outcome: Progressing  Intervention: Optimize Functional Ability  Recent Flowsheet Documentation  Taken 11/30/2021 1930 by Darene Lamer, RN  Activity Management: activity adjusted per tolerance     Problem: Oral Intake Inadequate (Heart Failure)  Goal: Optimal Nutrition Intake  Outcome: Progressing     Problem: Respiratory Compromise (Heart Failure)  Goal: Effective Oxygenation and Ventilation  Outcome: Progressing     Problem: Sleep Disordered Breathing (Heart Failure)  Goal: Effective Breathing Pattern During Sleep  Outcome: Progressing     Problem: Self-Care Deficit  Goal: Improved Ability to Complete Activities of Daily Living  Outcome: Progressing     Problem: Fall Injury Risk  Goal: Absence of Fall and Fall-Related Injury  Outcome: Progressing  Intervention: Promote Injury-Free Environment  Recent Flowsheet Documentation  Taken 11/30/2021 1930 by Darene Lamer, RN  Safety Interventions: commode/urinal/bedpan at bedside     Problem: Adjustment to Illness COPD (Chronic Obstructive Pulmonary Disease)  Goal: Optimal Chronic Illness Coping  Outcome: Progressing     Problem: Functional Ability Impaired COPD (Chronic Obstructive Pulmonary Disease)  Goal: Optimal Level of Functional Independence  Outcome: Progressing  Intervention: Optimize Functional Ability  Recent Flowsheet Documentation  Taken 11/30/2021 1930 by Darene Lamer, RN  Activity Management: activity adjusted per tolerance     Problem: Infection COPD (Chronic Obstructive Pulmonary Disease)  Goal: Absence of Infection Signs and Symptoms  Outcome: Progressing     Problem: Oral Intake Inadequate COPD (Chronic Obstructive Pulmonary Disease)  Goal: Improved Nutrition Intake  Outcome: Progressing     Problem: Respiratory Compromise COPD (Chronic Obstructive Pulmonary Disease)  Goal: Effective Oxygenation and Ventilation  Outcome: Progressing  Intervention: Promote Airway Secretion Clearance  Recent Flowsheet Documentation  Taken 11/30/2021 1930 by Darene Lamer,  RN  Activity Management: activity adjusted per tolerance  Intervention: Optimize Oxygenation and Ventilation  Recent Flowsheet Documentation  Taken 11/30/2021 1930 by Darene Lamer, RN  Head of Bed [facility]) Positioning: HOB at 20-30 degrees

## 2021-12-01 NOTE — Unmapped (Signed)
I was the supervising physician in the delivery of the service. Akeema Broder R Acasia Skilton, MD

## 2021-12-01 NOTE — Unmapped (Signed)
Daily Progress Note    Assessment/Plan:    Principal Problem:    Acute on chronic diastolic congestive heart failure (CMS-HCC)  Active Problems:    Spinal stenosis of lumbar region    COPD (chronic obstructive pulmonary disease) (CMS-HCC)    Sleep apnea in adult    Essential hypertension    AKI (acute kidney injury) (CMS-HCC)    Type 2 diabetes mellitus with stage 3b chronic kidney disease, without long-term current use of insulin (CMS-HCC)    Chronic cystitis    Peripheral neuropathy    Chronic respiratory failure with hypoxia (CMS-HCC)    Anemia in chronic kidney disease    Pulmonary hypertension (CMS-HCC)    Chronic prescription opiate use    Dyspnea on exertion    Cardiac amyloidosis (CMS-HCC)    Dyspepsia    Iron deficiency anemia due to chronic blood loss  Resolved Problems:    * No resolved hospital problems. *                 Barbara Huber is a 80 y.o. female who presented to [facility] with Acute on chronic diastolic congestive heart failure (CMS-HCC).    [name] Exacerbation: She reports that her weight has been around 218-219 though her understanding is that her dry weight should be around 214.Weight on admission 223lbs. She tried taking a dose of metolazone earlier in the week and also increasing her afternoon torsemide from 20 to 40 with minimal response. She somewhat more short of breath than baseline, especially with exertion. LE edema is stable to her. On labs her BNP is up from baseline. Troponin elevated as well though this is likely chronic for her. Given 40mg  IV lasix in the ED and 5mg  metolazone. Net negative -900 yesterday and weight down to 213 today  - continue 40mg  IV lasix BID  - Replete Mg and K to cardiac parameters  - Strict I&O  - Daily weights  ??  AKI on [name]: Creatinine increased to 3.15 from a baseline around 2.5 also with azotemia and mild hyperkalemia. I suspect that this is most likely related to a cardio-renal process, however could also be related to her recently increased dose of diuretics. Creatinine and BUN both trending down. Will plan to continue lasix challenge and follow her creatinine.   - Daily creatinine, lytes BID while diuresing   ??  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 TSH and ammonia wnl  - Trial of diuresis to see if this addresses her AKI (if it is indeed cardio-renal in nature). Hope that as renal function improves so will her symptoms  - will decrease gabapentin to 200mg  nightly  - Discuss possible medication causes with pharmacy   ??  Elevated HS Troponin: This appears to be similar to prior levels and likely chronic, on review of prior labs. Renal dysfunction may also be contributing.    ??  COPD: On 3L at home at baseline. No evidence of exacerbation at this time  - Continue formulary equivalents of home Spiriva and Advair  - PRN albuterol if needed  ??  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   ??  Atrial Fibrillation: Currently in sinus.   - Continue amiodarone  -  Continue apixaban 2.4 BID  ??  Type II DM: Sliding scale insulin while admitted  ??  Chronic Pain: Continue home oxycontin, verified in PDMP database   ??  Cardiac Amyloidosis: Continue home Tafamidis 61 mg daily (non-formulary, pt supplied)  ??  Recurrent Cystitis: She is on weekly fosfomycin. No symptoms today.   ??  HLD: Holding home rosuvastatin. She has a bad reaction to atorvastatin which is the formulary equivalent so will not order this  ??  Diet:Heart healthy diet  ZO:XWRU  DVT PPx: on apixiban  Code Status:FULL CODE  discussed with patient and daughter on admission. Her daughter Devota Pace would be her HCDM if needed [phone]. ___________________________________________________________________    Subjective:  Pt reports improvement in myclonic jerking, but they are still present. Pt willing to decrease meds if necessary. Feels her breathing has improved, and she has less LE swelling.    Daughter at bedside, discussed plan of care    Recent Results (from the past 24 hour(s))   POCT Glucose    Collection Time: 11/30/21  4:31 PM   Result Value Ref Range    Glucose, POC 159 70 - 179 mg/dL   POCT Glucose    Collection Time: 11/30/21  8:41 PM   Result Value Ref Range    Glucose, POC 168 70 - 179 mg/dL   Basic metabolic panel    Collection Time: 12/01/21  3:45 AM   Result Value Ref Range    Sodium 133 (L) 135 - 145 mmol/L    Potassium 4.5 3.4 - 4.8 mmol/L    Chloride 92 (L) 98 - 107 mmol/L    CO2 36.0 (H) 20.0 - 31.0 mmol/L    Anion Gap 5 5 - 14 mmol/L    BUN 78 (H) 9 - 23 mg/dL    Creatinine 1.47 (H) 0.60 - 0.80 mg/dL    BUN/Creatinine Ratio 29     eGFR [name]-EPI (2021) Female 17 (L) >=60 mL/min/1.66m2    Glucose 117 70 - 179 mg/dL    Calcium 8.8 8.7 - 82.9 mg/dL   Magnesium Level    Collection Time: 12/01/21  3:45 AM   Result Value Ref Range    Magnesium 3.5 (H) 1.6 - 2.6 mg/dL   Phosphorus Level    Collection Time: 12/01/21  3:45 AM   Result Value Ref Range    Phosphorus 4.7 2.4 - 5.1 mg/dL   CBC    Collection Time: 12/01/21  3:45 AM   Result Value Ref Range    WBC 6.3 3.6 - 11.2 10*9/L    RBC 3.47 (L) 3.95 - 5.13 10*12/L    HGB 8.1 (L) 11.3 - 14.9 g/dL    HCT 56.2 (L) 13.0 - 44.0 %    MCV 76.1 (L) 77.6 - 95.7 fL    MCH 23.3 (L) 25.9 - 32.4 pg    MCHC 30.7 (L) 32.0 - 36.0 g/dL    RDW 86.5 (H) 78.4 - 15.2 %    MPV 8.0 6.8 - 10.7 fL    Platelet 242 150 - 450 10*9/L   POCT Glucose    Collection Time: 12/01/21  8:04 AM   Result Value Ref Range    Glucose, POC 116 70 - 179 mg/dL   POCT Glucose    Collection Time: 12/01/21  1:34 PM   Result Value Ref Range    Glucose, POC 198 (H) 70 - 179 mg/dL     Labs/Studies:  Labs and Studies from the last 24hrs per EMR  and Reviewed    Objective:  Temp:  [36.6 ??C (97.9 ??F)-37.1 ??C (98.8 ??F)] 36.6 ??C (97.9 ??F)  Heart Rate:  [58-68] 63  Resp:  [16-20] 19  BP: (102-128)/(51-71) 104/71  SpO2:  [91 %-100 %] 96 %    GEN: alert, cooperative female, sitting up in bed, in NAD  HEENT: EOMI, Sclera anicteric, MMM  CARDS: RRR, No M/R/G.  PULM: CTA B, no wheezes or crackles, good air movement, no increased work of breathing on 3L [place] (home oxygen)  SKIN: no rashes, lesions or breakdown on examined skin  PSYCH: alert, oriented x3, mood appropriate  ABD: soft, nondistended, nontender, BS normoactive  EXTREM: trace bilateral LE edema  NEURO: grossly nonfocal, moving all extremities equally, bilateral upper extremities with intermittent muscular twitching at rest and with movement.

## 2021-12-01 NOTE — Unmapped (Signed)
Initial Consult Note        Requesting Attending Physician:  Eusebio Me, MD  Service Requesting Consult: [facility])     Assessment and Plan          Florie Braun is a 80 y.o. female with history of [name] s/t cardiac amyloidosis, DM2, [name], DM2, HTN, HLD, pulmonary HTN, COPD on home oxygen, and OSA on whom I have been asked by Eusebio Me, MD to consult for myoclonic jerks.    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  Chronic, with worsening symptomatology over the past month or so.  Suspect toxic etiology in the setting of [name] and chronic opioid use, and as such recommend thorough kidney and liver evaluation.  On chart review it appears that her symptoms appear to track with elevated creatinine levels, which have been uptrending since June 2022.  Can work with pain specialist to attempt to decrease her oxycodone if tolerated.  Once primary causes are addressed, can consider empiric treatment; would suggest Keppra as first-line choice.    Recommendations:  -Encourage thorough liver and kidney work-up  -Attempt to reduced use opioid use  -Consider use of Keppra once primary causes are addressed; recommended starting dose 500 mg twice daily    This patient was seen and discussed with Dr. Lanora Manis, who agrees with the above assessment and plan.      Owens Loffler, DO, PGY-3, [facility]          HPI        Reason for Consult: Myoclonic jerking    Shereka Vegh is a 80 y.o. female on with history of [name] s/t cardiac amyloidosis, DM2, [name], DM2, HTN, HLD, pulmonary HTN, COPD on baseline 3L of oxygen at home, and OSA on AVAPS who was admitted on 12/4 for acute on chronic diastolic congestive heart failure.  I have been asked by Eusebio Me, MD to consult for myoclonic jerks.    History obtained from patient in addition to daughter who is at bedside.  Ms. Williams has been experiencing involuntary jerking of her arms ongoing for approximately 1 year.  At the time of symptom onset the jerks were subtle however they have progressed over time.  They have become debilitating within the past month, resulting frequently dropping of objects.  They are provoked with movement but also present also at rest.  She has myoclonus in her lower extremities as well which affects her ability to ambulate and rise from a seated position.    Recent medication adjustments include reduction of her amiodarone dosing from 200-100 mg in an attempt to improve the myoclonus, however this was ineffective. Of note patient has a longstanding history of opioid use secondary to chronic pain.  Additionally, patient has elevated creatinine at baseline in the setting of [name]; creatinine appears to have been uptrending for several months per chart evaluation.    Allergies   Allergen Reactions   ??? Nitrofurantoin Anaphylaxis and Other (See Comments)   ??? Lipitor [Atorvastatin] Muscle Pain     SOB, Headache, fatigue, sick on my stomach      Current Facility-Administered Medications   Medication Dose Route Frequency Provider Last Rate Last Admin   ??? acetaminophen (TYLENOL) tablet 1,000 mg  1,000 mg Oral Q8H PRN Rimma Osipov, MD       ??? albuterol (PROVENTIL HFA;VENTOLIN HFA) 90 mcg/actuation inhaler 2 puff  2 puff Inhalation Q8H PRN Zannie Cove, MD       ??? amiodarone (PACERONE)  tablet 100 mg  100 mg Oral Daily Rimma Osipov, MD   100 mg at 11/30/21 1610   ??? apixaban (ELIQUIS) tablet 2.5 mg  2.5 mg Oral BID Rimma Osipov, MD   2.5 mg at 11/30/21 2051   ??? calcium carbonate (TUMS) chewable tablet 400 mg of elem calcium  400 mg of elem calcium Oral Daily PRN Rimma Osipov, MD       ??? dextrose 50 % in water (D50W) 50 % solution 12.5 g  12.5 g Intravenous Q10 Min PRN Rimma Osipov, MD       ??? ferrous sulfate tablet 325 mg  325 mg Oral Daily Rimma Osipov, MD       ??? fluticasone furoate-vilanteroL (BREO ELLIPTA) 100-25 mcg/dose inhaler 1 puff  1 puff Inhalation Daily (RT) Rimma Osipov, MD   1 puff at 11/30/21 1036   ??? furosemide (LASIX) injection 40 mg  40 mg Intravenous BID Dorise Hiss, ACNP   40 mg at 11/30/21 1453   ??? gabapentin (NEURONTIN) capsule 300 mg  300 mg Oral At bedtime Rimma Osipov, MD   300 mg at 11/30/21 2050   ??? glucagon injection 1 mg  1 mg Intramuscular Once PRN Rimma Osipov, MD       ??? glucose chewable tablet 16 g  16 g Oral Q10 Min PRN Rimma Osipov, MD       ??? guaiFENesin (ROBITUSSIN) oral syrup  200 mg Oral Q4H PRN Zannie Cove, MD       ??? insulin lispro (HumaLOG) injection 0-20 Units  0-20 Units Subcutaneous ACHS Zannie Cove, MD   1 Units at 11/30/21 2051   ??? melatonin tablet 3 mg  3 mg Oral Nightly PRN Zannie Cove, MD       ??? montelukast (SINGULAIR) tablet 10 mg  10 mg Oral Daily Rimma Osipov, MD   10 mg at 11/30/21 9604   ??? naloxone (NARCAN) injection 0.1 mg  0.1 mg Intravenous Q5 Min PRN Rimma Osipov, MD       ??? ondansetron (ZOFRAN-ODT) disintegrating tablet 8 mg  8 mg Oral Q8H PRN Rimma Osipov, MD        Or   ??? ondansetron (ZOFRAN) injection 4 mg  4 mg Intravenous Q8H PRN Rimma Osipov, MD       ??? oxyCODONE (OxyCONTIN) 12 hr crush resistant ER/CR tablet 10 mg  10 mg Oral Q12H Rimma Osipov, MD   10 mg at 11/30/21 2050   ??? pantoprazole (PROTONIX) EC tablet 20 mg  20 mg Oral Daily Rimma Osipov, MD   20 mg at 11/30/21 5409   ??? polyethylene glycol (MIRALAX) packet 17 g  17 g Oral Daily Rimma Osipov, MD   17 g at 11/30/21 8119   ??? senna (SENOKOT) tablet 2 tablet  2 tablet Oral Nightly Rimma Osipov, MD       ??? tafamidis cap 61 mg  61 mg Oral Daily Rimma Osipov, MD       ??? umeclidinium (INCRUSE ELLIPTA) 62.5 mcg/actuation inhaler 1 puff  1 puff Inhalation Daily (RT) Rimma Osipov, MD   1 puff at 11/30/21 1223       Past Medical History:   Diagnosis Date   ??? Acute kidney injury superimposed on chronic kidney disease (CMS-HCC) 10/11/2020   ??? Acute on chronic diastolic (congestive) heart failure (CMS-HCC) 08/23/2020   ??? Arthritis    ??? Calculus of kidney    ??? Calculus of ureter    ??? CHF (congestive heart failure) (CMS-HCC)    ???  Chronic atrial fibrillation (CMS-HCC) 07/20/2019   ??? COPD (chronic obstructive pulmonary disease) (CMS-HCC)    ??? Diabetes (CMS-HCC)    ??? Gangrenous cholecystitis 10/11/2020   ??? GERD (gastroesophageal reflux disease)    ??? Hydronephrosis    ??? Hypertension    ??? Hyponatremia 10/11/2020   ??? Intermediate coronary syndrome (CMS-HCC) 03/13/2014   ??? Lower extremity edema 09/28/2020   ??? Lumbar stenosis    ??? Microscopic hematuria    ??? Nausea alone    ??? Nephrolithiasis 04/17/2016   ??? Neuropathy    ??? Nocturia    ??? Other chronic cystitis    ??? Pulmonary hypertension (CMS-HCC)    ??? Renal colic    ??? Sleep apnea    ??? Unstable angina pectoris (CMS-HCC) 03/13/2014       Past Surgical History:   Procedure Laterality Date   ??? BACK SURGERY  1995   ??? CARPAL TUNNEL RELEASE Left 2014   ??? HYSTERECTOMY  1971   ??? IR INSERT CHOLECYSTOSMY TUBE PERCUTANEOUS  10/02/2020    IR INSERT CHOLECYSTOSMY TUBE PERCUTANEOUS 10/02/2020 Braulio Conte, MD IMG VIR H&V [facility]   ??? LUMBAR DISC SURGERY     ??? PR REMOVAL GALLBLADDER N/A 10/06/2020    Procedure: CHOLECYSTECTOMY;  Surgeon: Katherina Mires, MD;  Location: [facility];  Service: Trauma   ??? PR RIGHT HEART CATH O2 SATURATION & CARDIAC OUTPUT N/A 09/30/2020    Procedure: Right Heart Catheterization;  Surgeon: Neal Dy, MD;  Location: [facility] CATH;  Service: Cardiology   ??? PR RIGHT HEART CATH O2 SATURATION & CARDIAC OUTPUT N/A 01/09/2021    Procedure: Right Heart Catheterization;  Surgeon: Lesle Reek, MD;  Location: [facility] CATH;  Service: Cardiology       Social History     Socioeconomic History   ??? Marital status: Widowed     Spouse name: None   ??? Number of children: None   ??? Years of education: None   ??? Highest education level: None   Occupational History   ??? Occupation: retired   Tobacco Use   ??? Smoking status: Former     Packs/day: 0.33     Types: Cigarettes     Quit date: 2019     Years since quitting: 3.9   ??? Smokeless tobacco: Never ??? Tobacco comments:     Quit a few years ago   Vaping Use   ??? Vaping Use: Never used   Substance and Sexual Activity   ??? Alcohol use: No   ??? Drug use: No   Other Topics Concern   ??? Exercise No   Social History Narrative    Previously worked in Designer, fashion/clothing, retired.  Daughter is Economist who works at [employer].  Her niece is Selena Batten, [employer] Anesthesia Tech.  Lives in [place] with daughter and son-in-law.     Social Determinants of Health     Financial Resource Strain: Low Risk    ??? Difficulty of Paying Living Expenses: Not hard at all   Food Insecurity: No Food Insecurity   ??? Worried About Programme researcher, broadcasting/film/video in the Last Year: Never true   ??? Ran Out of Food in the Last Year: Never true   Transportation Needs: No Transportation Needs   ??? Lack of Transportation (Medical): No   ??? Lack of Transportation (Non-Medical): No       Family History   Problem Relation Age of Onset   ??? Hypertension Mother    ??? Cancer  Father         COLON CANCER   ??? Anesthesia problems Neg Hx    ??? Broken bones Neg Hx    ??? Clotting disorder Neg Hx    ??? Collagen disease Neg Hx    ??? Diabetes Neg Hx    ??? Dislocations Neg Hx    ??? Fibromyalgia Neg Hx    ??? Gout Neg Hx    ??? Hemophilia Neg Hx    ??? Osteoporosis Neg Hx    ??? Rheumatologic disease Neg Hx    ??? Scoliosis Neg Hx    ??? Severe sprains Neg Hx    ??? Sickle cell anemia Neg Hx    ??? Spinal Compression Fracture Neg Hx    ??? GU problems Neg Hx    ??? Kidney cancer Neg Hx    ??? Prostate cancer Neg Hx        Code Status: Full Code     Review of Systems     A 12-system review of systems was conducted and was negative except as documented above in the HPI.       Objective        Temp:  [36.5 ??C (97.7 ??F)-36.8 ??C (98.2 ??F)] 36.8 ??C (98.2 ??F)  Heart Rate:  [54-63] 58  Resp:  [10-16] 16  BP: (109-134)/(56-81) 122/56  MAP (mmHg):  [75-89] 75  SpO2:  [93 %-100 %] 100 %  BMI (Calculated):  [35.86] 35.86  No intake/output data recorded.    Physical Exam:  General Appearance:Chronically ill appearing. Obese.  HEENT: Head is atraumatic and normocephalic. Sclera anicteric without injection. Oropharyngeal membranes are moist with no erythema or exudate.  Neck: Deferred.  Lungs: Increased work of breathing  Heart: Regular rate and rhythm.  Abdomen: Protuberant.  Extremities: Bilateral lower extremity edema.    Neurological Examination:     Mental Status: The patient was alert, engaged and fully oriented.    Cranial Nerves: PERRL. Pursuit eye movements were uninterrupted with full range and without more than end-gaze nystagmus. Facial sensation intact bilaterally to light touch on the forehead, cheek, and chin. Face symmetric at rest. Normal facial movement bilaterally, including forehead, eye closure and grimace/smile. Hearing intact to conversation. Shoulder shrug full strength bilaterally. Palate movement is symmetric. Tongue protrudes midline and tongue movements are normal.    Motor Exam: Normal bulk.  Frequent upper> lower extremity myoclonus, negative myoclonus predominant  Pronator drift is absent.  RUE: 5/5 grossly throughout.  LUE: 5/5 grossly throughout.  RLE: 5/5 grossly throughout.  LLE: 5 /5 grossly throughout.    Reflexes:   R L   Biceps +1 +1   Brachioradialis +1 +1   Triceps +1 +1   Patella +0 +0   Achilles +0 +0   Toes are downgoing bilaterally.    Sensory:  Decreased vibratory sensation bilaterally below the knees  Decreased light touch bilaterally below the knees    Cerebellar/Coordination/Gait: Rapid alternating movements are normal in bilateral upper extremities. Finger-to-nose is normal without ataxia or dysmetria bilaterally.              Diagnostic Studies      All Labs Last 24hrs:   Recent Results (from the past 24 hour(s))   hsTroponin I (serial 2-6H CONTINUATION w/ delta)    Collection Time: 11/30/21 12:16 AM   Result Value Ref Range    hsTroponin I 65 (HH) <=34 ng/L    delta hsTroponin I 2 <=7 ng/L   POCT Glucose  Collection Time: 11/30/21  4:43 AM   Result Value Ref Range    Glucose, POC 143 70 - 179 mg/dL   hsTroponin I - 6 Hour    Collection Time: 11/30/21  5:35 AM   Result Value Ref Range    hsTroponin I 74 (HH) <=34 ng/L    delta hsTroponin I 9 (H) <=7 ng/L   Basic metabolic panel    Collection Time: 11/30/21  5:35 AM   Result Value Ref Range    Sodium 133 (L) 135 - 145 mmol/L    Potassium 4.2 3.4 - 4.8 mmol/L    Chloride 92 (L) 98 - 107 mmol/L    CO2 34.0 (H) 20.0 - 31.0 mmol/L    Anion Gap 7 5 - 14 mmol/L    BUN 77 (H) 9 - 23 mg/dL    Creatinine 9.60 (H) 0.60 - 0.80 mg/dL    BUN/Creatinine Ratio 26     eGFR [name]-EPI (2021) Female 16 (L) >=60 mL/min/1.66m2    Glucose 168 70 - 179 mg/dL    Calcium 8.8 8.7 - 45.4 mg/dL   Magnesium Level    Collection Time: 11/30/21  5:35 AM   Result Value Ref Range    Magnesium 3.7 (H) 1.6 - 2.6 mg/dL   Phosphorus Level    Collection Time: 11/30/21  5:35 AM   Result Value Ref Range    Phosphorus 5.2 (H) 2.4 - 5.1 mg/dL   CBC    Collection Time: 11/30/21  5:35 AM   Result Value Ref Range    WBC 6.2 3.6 - 11.2 10*9/L    RBC 3.72 (L) 3.95 - 5.13 10*12/L    HGB 8.9 (L) 11.3 - 14.9 g/dL    HCT 09.8 (L) 11.9 - 44.0 %    MCV 75.8 (L) 77.6 - 95.7 fL    MCH 23.9 (L) 25.9 - 32.4 pg    MCHC 31.5 (L) 32.0 - 36.0 g/dL    RDW 14.7 (H) 82.9 - 15.2 %    MPV 7.9 6.8 - 10.7 fL    Platelet 269 150 - 450 10*9/L   Ammonia    Collection Time: 11/30/21  5:35 AM   Result Value Ref Range    Ammonia 21 11 - 32 umol/L   TSH    Collection Time: 11/30/21 11:58 AM   Result Value Ref Range    TSH 2.144 0.550 - 4.780 uIU/mL   Basic Metabolic Panel    Collection Time: 11/30/21 11:58 AM   Result Value Ref Range    Sodium 133 (L) 135 - 145 mmol/L    Potassium 4.2 3.4 - 4.8 mmol/L    Chloride 91 (L) 98 - 107 mmol/L    CO2 34.0 (H) 20.0 - 31.0 mmol/L    Anion Gap 8 5 - 14 mmol/L    BUN 78 (H) 9 - 23 mg/dL    Creatinine 5.62 (H) 0.60 - 0.80 mg/dL    BUN/Creatinine Ratio 27     eGFR [name]-EPI (2021) Female 16 (L) >=60 mL/min/1.21m2    Glucose 219 (H) 70 - 179 mg/dL    Calcium 8.7 8.7 - 13.0 mg/dL Magnesium Level    Collection Time: 11/30/21 11:58 AM   Result Value Ref Range    Magnesium 3.6 (H) 1.6 - 2.6 mg/dL   POCT Glucose    Collection Time: 11/30/21 12:15 PM   Result Value Ref Range    Glucose, POC 223 (H) 70 - 179 mg/dL   Basic Metabolic Panel  Collection Time: 11/30/21  1:54 PM   Result Value Ref Range    Sodium 134 (L) 135 - 145 mmol/L    Potassium 4.0 3.4 - 4.8 mmol/L    Chloride 90 (L) 98 - 107 mmol/L    CO2 34.0 (H) 20.0 - 31.0 mmol/L    Anion Gap 10 5 - 14 mmol/L    BUN 80 (H) 9 - 23 mg/dL    Creatinine 0.98 (H) 0.60 - 0.80 mg/dL    BUN/Creatinine Ratio 28     eGFR [name]-EPI (2021) Female 16 (L) >=60 mL/min/1.25m2    Glucose 170 70 - 179 mg/dL    Calcium 8.9 8.7 - 11.9 mg/dL   POCT Glucose    Collection Time: 11/30/21  4:31 PM   Result Value Ref Range    Glucose, POC 159 70 - 179 mg/dL   POCT Glucose    Collection Time: 11/30/21  8:41 PM   Result Value Ref Range    Glucose, POC 168 70 - 179 mg/dL       MRI brain with and without contrast - 12/2016  Scattered periventricular white matter changes, nonspecific; possible remote microhemorrhage in cerebellum, no abnormal enhancement

## 2021-12-01 NOTE — Unmapped (Signed)
Follow-up Consult Note        Requesting Attending Physician:  Eusebio Me, MD  Service Requesting Consult: [facility])     Assessment and Plan          Barbara Huber is a 80 y.o. female with history of [name] s/t cardiac amyloidosis, DM2, [name], DM2, HTN, HLD, pulmonary HTN, COPD on home oxygen, and OSA on whom I have been asked by Eusebio Me, MD to consult for myoclonic jerks.  ??  Myoclonic jerking: With predominant negative myoclonus observed on direct evaluation, in addition to a mild intention tremor. Chronic, with worsening symptomatology over the past month or so. Suspect toxic-metabolic etiology in the setting of AKI on [name] (particularly uremia, with BUN of 80) and chronic opioid use. On chart review it appears that her symptoms appear to track with elevated BUN and creatinine levels, which have been uptrending since June 2022. There is also concern for drug-induced myoclonus, so we would also recommend reducing oxycodone dose, which the pt is interested in trialing; pt has already decreased her home gabapentin from 2400 mg total daily to 600 mg daily.     - recommend continued workup and management of uremia  - encourage reduction of oxycodone dose; consider consulting chronic pain if needed  - we will sign off at this time      This patient was seen and discussed with Dr. Hampton Abbot, who agrees with the above assessment and plan.      Sung Amabile, MD  PGY-3, [facility]       Subjective        Reason for Consult: myoclonic jerks    Barbara Huber is a 81 y.o. female on with history of [name] s/t cardiac amyloidosis, DM2, [name], DM2, HTN, HLD, pulmonary HTN, COPD on baseline 3L of oxygen at home, and OSA on AVAPS who was admitted on 12/4 for acute on chronic diastolic congestive heart failure.  I have been asked by Eusebio Me, MD to consult for myoclonic jerks.  ??  History obtained from patient in addition to daughter who is at bedside.  [name] has been experiencing involuntary jerking of her arms ongoing for approximately 1 year.  At the time of symptom onset the jerks were subtle however they have progressed over time. They have become debilitating within the past month, resulting frequently dropping of objects.  They are provoked with movement but also present also at rest.  She has myoclonus in her lower extremities as well which affects her ability to ambulate and rise from a seated position.  ??  Recent medication adjustments include reduction of her amiodarone dosing from 200-100 mg in an attempt to improve the myoclonus, however this was ineffective. Of note patient has a longstanding history of opioid use secondary to chronic pain (10 mg q12h).  Additionally, patient has elevated creatinine at baseline in the setting of [name]. 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    Interval history:   Pt reports that she has taken oxycodone for back pain and stiffness for many years, which has been well-controlled, and is interested in trialing a reduced dose. She had previously been on a total of 2400 mg of gabapentin daily but was able to taper that to 600 mg daily.  On admission, Cr was elevated to 3.15 (baseline 2.5) but has since improved to 2.69; however, BUN has remained elevated  at 80 (baseline 40).            Objective        Temp:  [36.6 ??C (97.9 ??F)-37.1 ??C (98.8 ??F)] 37.1 ??C (98.8 ??F)  Heart Rate:  [54-62] 62  Resp:  [14-20] 20  BP: (109-124)/(56-81) 119/56  MAP (mmHg):  [75-80] 75  SpO2:  [91 %-100 %] 91 %  No intake/output data recorded.    Physical Exam:  General Appearance: Chronically ill appearing. Obese.  HEENT: Head is atraumatic and normocephalic. Sclera anicteric without injection. Oropharyngeal membranes are moist with no erythema or exudate.  Neck: Deferred.  Lungs: Increased work of breathing  Heart: Regular rate and rhythm.  Abdomen: Protuberant.  Extremities: Bilateral lower extremity edema.  ??  Neurological Examination:   ??  Mental Status: The patient was alert, engaged and fully oriented.  ??  Cranial Nerves: PERRL. Pursuit eye movements were uninterrupted with full range and without more than end-gaze nystagmus. Facial sensation intact bilaterally to light touch on the forehead, cheek, and chin. Face symmetric at rest. Normal facial movement bilaterally, including forehead, eye closure and grimace/smile. Hearing intact to conversation. Shoulder shrug full strength bilaterally. Palate movement is symmetric. Tongue protrudes midline and tongue movements are normal.  ??  Motor Exam: Normal bulk.  Frequent upper> lower extremity myoclonus, negative myoclonus predominant  Pronator drift is absent.  RUE: 5/5 grossly throughout.  LUE: 5/5 grossly throughout.  RLE: 5/5 grossly throughout.  LLE: 5 /5 grossly throughout.  ??  Reflexes:  ?? R L   Biceps +1 +1   Brachioradialis +1 +1   Triceps +1 +1   Patella +0 +0   Achilles +0 +0     ??  Sensory:  Decreased vibratory sensation bilaterally below the knees  Decreased light touch bilaterally below the knees  ??  Cerebellar/Coordination/Gait: Rapid alternating movements are normal in bilateral upper extremities. Finger-to-nose is normal without ataxia or dysmetria bilaterally. Mild intention tremor in BUE.            Medications:  Scheduled medications:   ??? amiodarone  100 mg Oral Daily   ??? apixaban  2.5 mg Oral BID   ??? ferrous sulfate  325 mg Oral Daily   ??? fluticasone furoate-vilanteroL  1 puff Inhalation Daily (RT)   ??? furosemide  40 mg Intravenous BID   ??? gabapentin  300 mg Oral At bedtime   ??? insulin lispro  0-20 Units Subcutaneous ACHS   ??? montelukast  10 mg Oral Daily   ??? oxyCODONE  10 mg Oral Q12H   ??? pantoprazole  20 mg Oral Daily   ??? polyethylene glycol  17 g Oral Daily   ??? senna  2 tablet Oral Nightly   ??? tafamidis  61 mg Oral Daily   ??? umeclidinium  1 puff Inhalation Daily (RT)     Continuous infusions:   PRN medications: acetaminophen, albuterol, calcium carbonate, dextrose in water, glucagon, glucose, guaiFENesin, melatonin, naloxone, ondansetron **OR** ondansetron     Diagnostic Studies      MRI brain with and without contrast - 12/2016  Scattered periventricular white matter changes, nonspecific; possible remote microhemorrhage in cerebellum, no abnormal enhancement

## 2021-12-01 NOTE — Unmapped (Signed)
Patient is resting in bed. No distress noted. Call bell within reach. Daughter at bedside. Commode at bedside. Alert and oriented X4. Regular diet, tolerating well. Voiding. BM today. Scheduled meds given. PT/OT worked with patient, tolerated well.   Problem: Adult Inpatient Plan of Care  Goal: Plan of Care Review  Outcome: Ongoing - Unchanged  Goal: Patient-Specific Goal (Individualized)  Outcome: Ongoing - Unchanged  Goal: Absence of Hospital-Acquired Illness or Injury  Outcome: Ongoing - Unchanged  Intervention: Identify and Manage Fall Risk  Recent Flowsheet Documentation  Taken 11/30/2021 1600 by Ara Kussmaul, RN  Safety Interventions:   commode/urinal/bedpan at bedside   environmental modification   fall reduction program maintained   family at bedside   lighting adjusted for tasks/safety   low bed   nonskid shoes/slippers when out of bed  Taken 11/30/2021 1400 by Ara Kussmaul, RN  Safety Interventions:   commode/urinal/bedpan at bedside   environmental modification   fall reduction program maintained   family at bedside   lighting adjusted for tasks/safety   low bed   nonskid shoes/slippers when out of bed  Taken 11/30/2021 1200 by Ara Kussmaul, RN  Safety Interventions:   commode/urinal/bedpan at bedside   environmental modification   fall reduction program maintained   family at bedside   low bed   lighting adjusted for tasks/safety   nonskid shoes/slippers when out of bed  Taken 11/30/2021 1000 by Ara Kussmaul, RN  Safety Interventions:   commode/urinal/bedpan at bedside   environmental modification   fall reduction program maintained   family at bedside   lighting adjusted for tasks/safety   low bed   nonskid shoes/slippers when out of bed  Taken 11/30/2021 0800 by Ara Kussmaul, RN  Safety Interventions:   environmental modification   fall reduction program maintained   commode/urinal/bedpan at bedside   family at bedside   lighting adjusted for tasks/safety   low bed nonskid shoes/slippers when out of bed  Intervention: Prevent Skin Injury  Recent Flowsheet Documentation  Taken 11/30/2021 0720 by Ara Kussmaul, RN  Skin Protection: incontinence pads utilized  Intervention: Prevent and Manage VTE (Venous Thromboembolism) Risk  Recent Flowsheet Documentation  Taken 11/30/2021 1400 by Ara Kussmaul, RN  Activity Management: up in chair  Taken 11/30/2021 1200 by Ara Kussmaul, RN  Activity Management:   activity adjusted per tolerance   up in chair  Taken 11/30/2021 1000 by Ara Kussmaul, RN  Activity Management: up in chair  Taken 11/30/2021 0800 by Ara Kussmaul, RN  Activity Management: activity adjusted per tolerance  Goal: Optimal Comfort and Wellbeing  Outcome: Ongoing - Unchanged  Goal: Readiness for Transition of Care  Outcome: Ongoing - Unchanged  Goal: Rounds/Family Conference  Outcome: Ongoing - Unchanged     Problem: Adjustment to Illness (Heart Failure)  Goal: Optimal Coping  Outcome: Ongoing - Unchanged     Problem: Cardiac Output Decreased (Heart Failure)  Goal: Optimal Cardiac Output  Outcome: Ongoing - Unchanged     Problem: Dysrhythmia (Heart Failure)  Goal: Stable Heart Rate and Rhythm  Outcome: Ongoing - Unchanged     Problem: Fluid Imbalance (Heart Failure)  Goal: Fluid Balance  Outcome: Ongoing - Unchanged     Problem: Functional Ability Impaired (Heart Failure)  Goal: Optimal Functional Ability  Outcome: Ongoing - Unchanged  Intervention: Optimize Functional Ability  Recent Flowsheet Documentation  Taken 11/30/2021 1400 by Ara Kussmaul, RN  Activity Management: up in chair  Taken  11/30/2021 1200 by Ara Kussmaul, RN  Activity Management:   activity adjusted per tolerance   up in chair  Taken 11/30/2021 1000 by Ara Kussmaul, RN  Activity Management: up in chair  Taken 11/30/2021 0800 by Ara Kussmaul, RN  Activity Management: activity adjusted per tolerance     Problem: Oral Intake Inadequate (Heart Failure)  Goal: Optimal Nutrition Intake  Outcome: Ongoing - Unchanged     Problem: Respiratory Compromise (Heart Failure)  Goal: Effective Oxygenation and Ventilation  Outcome: Ongoing - Unchanged     Problem: Sleep Disordered Breathing (Heart Failure)  Goal: Effective Breathing Pattern During Sleep  Outcome: Ongoing - Unchanged     Problem: Self-Care Deficit  Goal: Improved Ability to Complete Activities of Daily Living  Outcome: Ongoing - Unchanged     Problem: Fall Injury Risk  Goal: Absence of Fall and Fall-Related Injury  Outcome: Ongoing - Unchanged  Intervention: Promote Injury-Free Environment  Recent Flowsheet Documentation  Taken 11/30/2021 1600 by Ara Kussmaul, RN  Safety Interventions:   commode/urinal/bedpan at bedside   environmental modification   fall reduction program maintained   family at bedside   lighting adjusted for tasks/safety   low bed   nonskid shoes/slippers when out of bed  Taken 11/30/2021 1400 by Ara Kussmaul, RN  Safety Interventions:   commode/urinal/bedpan at bedside   environmental modification   fall reduction program maintained   family at bedside   lighting adjusted for tasks/safety   low bed   nonskid shoes/slippers when out of bed  Taken 11/30/2021 1200 by Ara Kussmaul, RN  Safety Interventions:   commode/urinal/bedpan at bedside   environmental modification   fall reduction program maintained   family at bedside   low bed   lighting adjusted for tasks/safety   nonskid shoes/slippers when out of bed  Taken 11/30/2021 1000 by Ara Kussmaul, RN  Safety Interventions:   commode/urinal/bedpan at bedside   environmental modification   fall reduction program maintained   family at bedside   lighting adjusted for tasks/safety   low bed   nonskid shoes/slippers when out of bed  Taken 11/30/2021 0800 by Ara Kussmaul, RN  Safety Interventions:   environmental modification   fall reduction program maintained   commode/urinal/bedpan at bedside   family at bedside   lighting adjusted for tasks/safety   low bed   nonskid shoes/slippers when out of bed     Problem: Adjustment to Illness COPD (Chronic Obstructive Pulmonary Disease)  Goal: Optimal Chronic Illness Coping  Outcome: Ongoing - Unchanged     Problem: Functional Ability Impaired COPD (Chronic Obstructive Pulmonary Disease)  Goal: Optimal Level of Functional Independence  Outcome: Ongoing - Unchanged  Intervention: Optimize Functional Ability  Recent Flowsheet Documentation  Taken 11/30/2021 1400 by Ara Kussmaul, RN  Activity Management: up in chair  Taken 11/30/2021 1200 by Ara Kussmaul, RN  Activity Management:   activity adjusted per tolerance   up in chair  Taken 11/30/2021 1000 by Ara Kussmaul, RN  Activity Management: up in chair  Taken 11/30/2021 0800 by Ara Kussmaul, RN  Activity Management: activity adjusted per tolerance     Problem: Infection COPD (Chronic Obstructive Pulmonary Disease)  Goal: Absence of Infection Signs and Symptoms  Outcome: Ongoing - Unchanged     Problem: Oral Intake Inadequate COPD (Chronic Obstructive Pulmonary Disease)  Goal: Improved Nutrition Intake  Outcome: Ongoing - Unchanged     Problem: Respiratory Compromise COPD (Chronic Obstructive  Pulmonary Disease)  Goal: Effective Oxygenation and Ventilation  Outcome: Ongoing - Unchanged  Intervention: Promote Airway Secretion Clearance  Recent Flowsheet Documentation  Taken 11/30/2021 1400 by Ara Kussmaul, RN  Activity Management: up in chair  Taken 11/30/2021 1200 by Ara Kussmaul, RN  Activity Management:   activity adjusted per tolerance   up in chair  Taken 11/30/2021 1000 by Ara Kussmaul, RN  Activity Management: up in chair  Taken 11/30/2021 0800 by Ara Kussmaul, RN  Activity Management: activity adjusted per tolerance  Intervention: Optimize Oxygenation and Ventilation  Recent Flowsheet Documentation  Taken 11/30/2021 0800 by Ara Kussmaul, RN  Head of Bed [facility]) Positioning: HOB at 20-30 degrees

## 2021-12-02 LAB — BASIC METABOLIC PANEL
ANION GAP: 4 mmol/L — ABNORMAL LOW (ref 5–14)
BLOOD UREA NITROGEN: 67 mg/dL — ABNORMAL HIGH (ref 9–23)
BUN / CREAT RATIO: 27
CALCIUM: 9 mg/dL (ref 8.7–10.4)
CHLORIDE: 94 mmol/L — ABNORMAL LOW (ref 98–107)
CO2: 37 mmol/L — ABNORMAL HIGH (ref 20.0–31.0)
CREATININE: 2.47 mg/dL — ABNORMAL HIGH
EGFR CKD-EPI (2021) FEMALE: 19 mL/min/{1.73_m2} — ABNORMAL LOW (ref >=60)
GLUCOSE RANDOM: 128 mg/dL (ref 70–179)
POTASSIUM: 4 mmol/L (ref 3.4–4.8)
SODIUM: 135 mmol/L (ref 135–145)

## 2021-12-02 LAB — CBC
HEMATOCRIT: 27.2 % — ABNORMAL LOW (ref 34.0–44.0)
HEMOGLOBIN: 8.6 g/dL — ABNORMAL LOW (ref 11.3–14.9)
MEAN CORPUSCULAR HEMOGLOBIN CONC: 31.5 g/dL — ABNORMAL LOW (ref 32.0–36.0)
MEAN CORPUSCULAR HEMOGLOBIN: 23.8 pg — ABNORMAL LOW (ref 25.9–32.4)
MEAN CORPUSCULAR VOLUME: 75.5 fL — ABNORMAL LOW (ref 77.6–95.7)
MEAN PLATELET VOLUME: 7.9 fL (ref 6.8–10.7)
PLATELET COUNT: 270 10*9/L (ref 150–450)
RED BLOOD CELL COUNT: 3.61 10*12/L — ABNORMAL LOW (ref 3.95–5.13)
RED CELL DISTRIBUTION WIDTH: 17.2 % — ABNORMAL HIGH (ref 12.2–15.2)
WBC ADJUSTED: 7.6 10*9/L (ref 3.6–11.2)

## 2021-12-02 LAB — PHOSPHORUS: PHOSPHORUS: 4.2 mg/dL (ref 2.4–5.1)

## 2021-12-02 LAB — MAGNESIUM: MAGNESIUM: 3 mg/dL — ABNORMAL HIGH (ref 1.6–2.6)

## 2021-12-02 MED ADMIN — tafamidis cap 61 mg **PATIENT SUPPLIED**: 61 mg | ORAL | @ 14:00:00 | Stop: 2021-12-02

## 2021-12-02 MED ADMIN — apixaban (ELIQUIS) tablet 2.5 mg: 2.5 mg | ORAL | @ 14:00:00 | Stop: 2021-12-02

## 2021-12-02 MED ADMIN — furosemide (LASIX) injection 40 mg: 40 mg | INTRAVENOUS | @ 12:00:00 | Stop: 2021-12-02

## 2021-12-02 MED ADMIN — acetaminophen (TYLENOL) tablet 1,000 mg: 1000 mg | ORAL | @ 06:00:00 | Stop: 2021-12-02

## 2021-12-02 MED ADMIN — umeclidinium (INCRUSE ELLIPTA) 62.5 mcg/actuation inhaler 1 puff: 1 | RESPIRATORY_TRACT | @ 14:00:00 | Stop: 2021-12-02

## 2021-12-02 MED ADMIN — gabapentin (NEURONTIN) capsule 200 mg: 200 mg | ORAL | @ 01:00:00

## 2021-12-02 MED ADMIN — apixaban (ELIQUIS) tablet 2.5 mg: 2.5 mg | ORAL | @ 01:00:00

## 2021-12-02 MED ADMIN — oxyCODONE (OxyCONTIN) 12 hr crush resistant ER/CR tablet 10 mg: 10 mg | ORAL | @ 14:00:00 | Stop: 2021-12-02

## 2021-12-02 MED ADMIN — pantoprazole (PROTONIX) EC tablet 20 mg: 20 mg | ORAL | @ 14:00:00 | Stop: 2021-12-02

## 2021-12-02 MED ADMIN — montelukast (SINGULAIR) tablet 10 mg: 10 mg | ORAL | @ 14:00:00 | Stop: 2021-12-02

## 2021-12-02 MED ADMIN — fluticasone furoate-vilanteroL (BREO ELLIPTA) 100-25 mcg/dose inhaler 1 puff: 1 | RESPIRATORY_TRACT | @ 14:00:00 | Stop: 2021-12-02

## 2021-12-02 MED ADMIN — oxyCODONE (OxyCONTIN) 12 hr crush resistant ER/CR tablet 10 mg: 10 mg | ORAL | @ 01:00:00 | Stop: 2021-12-14

## 2021-12-02 MED ADMIN — amiodarone (PACERONE) tablet 100 mg: 100 mg | ORAL | @ 14:00:00 | Stop: 2021-12-02

## 2021-12-02 MED ADMIN — insulin lispro (HumaLOG) injection 0-20 Units: 0-20 [IU] | SUBCUTANEOUS | @ 19:00:00 | Stop: 2021-12-02

## 2021-12-02 NOTE — Unmapped (Signed)
Problem: Adult Inpatient Plan of Care  Goal: Plan of Care Review  Outcome: Progressing  Goal: Patient-Specific Goal (Individualized)  Outcome: Progressing  Goal: Absence of Hospital-Acquired Illness or Injury  Outcome: Progressing  Intervention: Identify and Manage Fall Risk  Recent Flowsheet Documentation  Taken 12/01/2021 0800 by Palma Holter Remijio Holleran, RN  Safety Interventions:   commode/urinal/bedpan at bedside   family at bedside   low bed   fall reduction program maintained  Intervention: Prevent and Manage VTE (Venous Thromboembolism) Risk  Recent Flowsheet Documentation  Taken 12/01/2021 0800 by Earnie Rockhold L Lakendra Helling, RN  Activity Management: bedrest  Goal: Optimal Comfort and Wellbeing  Outcome: Progressing  Goal: Readiness for Transition of Care  Outcome: Progressing  Goal: Rounds/Family Conference  Outcome: Progressing     Problem: Cardiac Output Decreased (Heart Failure)  Goal: Optimal Cardiac Output  Outcome: Progressing     Problem: Dysrhythmia (Heart Failure)  Goal: Stable Heart Rate and Rhythm  Outcome: Progressing     Problem: Fluid Imbalance (Heart Failure)  Goal: Fluid Balance  Outcome: Progressing     Problem: Functional Ability Impaired (Heart Failure)  Goal: Optimal Functional Ability  Outcome: Progressing  Intervention: Optimize Functional Ability  Recent Flowsheet Documentation  Taken 12/01/2021 0800 by Palma Holter Estes Lehner, RN  Activity Management: bedrest

## 2021-12-02 NOTE — Unmapped (Signed)
Problem: Adult Inpatient Plan of Care  Goal: Plan of Care Review  Outcome: Progressing     Problem: Adult Inpatient Plan of Care  Goal: Absence of Hospital-Acquired Illness or Injury  Outcome: Progressing  Intervention: Identify and Manage Fall Risk  Recent Flowsheet Documentation  Taken 12/01/2021 2000 by Bernerd Limbo, RN  Safety Interventions:   bed alarm   commode/urinal/bedpan at bedside   fall reduction program maintained   lighting adjusted for tasks/safety   low bed   room near unit station  Intervention: Prevent Skin Injury  Recent Flowsheet Documentation  Taken 12/01/2021 2000 by Bernerd Limbo, RN  Skin Protection:   adhesive use limited   incontinence pads utilized  Intervention: Prevent and Manage VTE (Venous Thromboembolism) Risk  Recent Flowsheet Documentation  Taken 12/01/2021 2000 by Bernerd Limbo, RN  Activity Management: activity adjusted per tolerance  Range of Motion: Bilateral Upper and Lower Extremities     Problem: Adult Inpatient Plan of Care  Goal: Optimal Comfort and Wellbeing  Outcome: Progressing     Problem: Adjustment to Illness (Heart Failure)  Goal: Optimal Coping  Outcome: Progressing     Problem: Cardiac Output Decreased (Heart Failure)  Goal: Optimal Cardiac Output  Outcome: Progressing     Problem: Respiratory Compromise (Heart Failure)  Goal: Effective Oxygenation and Ventilation  Outcome: Progressing     Pt AAOX4, on 3.5L [place] at baseline, home CPAP overnight, IV Lasix as ordered, pt up to commode with assistance, purwick in place overnight, PRN Tylenol given, bed locked and in low position, call light within reach, bed alarm on.

## 2021-12-02 NOTE — Unmapped (Signed)
Physician Discharge Summary [facility]  1 [facility] OBSERVATION [facility]  [address]  [place] [address]  Dept: [phone]  Loc: [phone]     Identifying Information:   Barbara Huber  12-Oct-1941  [id number]    Primary Care Physician: Jacquiline Doe, MD   Code Status: Full Code    Admit Date: 11/29/2021    Discharge Date: 12/02/2021     Discharge To: Home with Home Health and/or PT/OT    Discharge Service: [facility] - Hospitalist [place] [facility]     Discharge Attending Physician: Elesa Massed, ANP    Discharge Diagnoses:  Principal Problem:    Acute on chronic diastolic congestive heart failure (CMS-HCC) POA: Unknown  Active Problems:    Spinal stenosis of lumbar region POA: Yes    COPD (chronic obstructive pulmonary disease) (CMS-HCC) POA: Yes    Sleep apnea in adult POA: Yes    Essential hypertension POA: Yes    AKI (acute kidney injury) (CMS-HCC) POA: Yes    Type 2 diabetes mellitus with stage 3b chronic kidney disease, without long-term current use of insulin (CMS-HCC) POA: Yes    Chronic cystitis POA: Yes    Peripheral neuropathy POA: Yes    Chronic respiratory failure with hypoxia (CMS-HCC) POA: Yes    Anemia in chronic kidney disease POA: Yes    Pulmonary hypertension (CMS-HCC) POA: Yes    Chronic prescription opiate use POA: Not Applicable    Dyspnea on exertion POA: Yes    Cardiac amyloidosis (CMS-HCC) POA: Yes    Dyspepsia POA: Yes    Iron deficiency anemia due to chronic blood loss POA: Yes  Resolved Problems:    * No resolved hospital problems. *      Outpatient Provider Follow Up Issues:   Given [name], resumed home torsemide (40qam, 20 qafternoon)   Neurology felt myoclonic jerking related to toxic metabolic etiology, did improve with improvement in creatinine.   Creatinine/BUN above baseline, now trending down  Hospital Course:     [name] Exacerbation: She reports that her weight has been around 218-219 though her understanding is that her dry weight should be around 214.Weight on admission 223lbs. She tried taking a dose of metolazone earlier in the week and also increasing her afternoon torsemide from 20 to 40 with minimal response. She somewhat more short of breath than baseline, especially with exertion. LE edema is stable to her. On labs her BNP is up from baseline. Troponin elevated as well though this is likely chronic for her. Started on 40mg  IV Lasix BID and given  5mg  metolazone. Weight now trending down.   ??  AKI on [name]: Creatinine increased to 3.15 from a baseline around 2.5 also with azotemia and mild hyperkalemia. I suspect that this is most likely related to a cardio-renal process, however could also be related to her recently increased dose of diuretics. Given lasix challenge with improvement in Creatinine and BUN, which are both trending down.   ??  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 TSH and ammonia wnl. Given [name] with improvement in AKI, and improvement in myoclonic twitching. Pt will follow up with neurology as scheduled  ??  COPD: On 3L at home at baseline. No evidence of exacerbation at  this time  Continued formulary equivalents of home Spiriva and Advair and provided PRN albuterol if needed  ??   Atrial Fibrillation: Remained in sinus rhythm. Continued on home amiodarone and apixiban.     Chronic Pain: Continue home oxycontin, verified in PDMP database   ??  Cardiac Amyloidosis: Continue home??Tafamidis 61 mg daily??(non-formulary, pt supplied)  Procedures:    No admission procedures for hospital encounter.  ______________________________________________________________________  Discharge Medications:     Your Medication List      CHANGE how you take these medications gabapentin 300 MG capsule  Commonly known as: NEURONTIN  Take 1 capsule (300 mg total) by mouth at bedtime.  What changed: Another medication with the same name was removed. Continue taking this medication, and follow the directions you see here.     torsemide 20 MG tablet  Commonly known as: DEMADEX  Take 2 tablets (40 mg total) by mouth every morning AND 1 tablet (20 mg total) daily after lunch. May also take 1 tablet (20 mg total) daily as needed (weight gain).  What changed: See the new instructions.        CONTINUE taking these medications    ACCU-CHEK AVIVA PLUS TEST STRP Strp  Generic drug: blood sugar diagnostic  UESE TO CHECK BLOOD SUGAR 3 TIMES A DAY BEFORE MEALS     acetaminophen 500 MG tablet  Commonly known as: TYLENOL  Take 1,000 mg by mouth daily as needed for pain.     ADVAIR HFA 115-21 mcg/actuation inhaler  Generic drug: fluticasone propion-salmeteroL  INHALE TWO PUFFS BY MOUTH TWICE A DAY     albuterol 90 mcg/actuation inhaler  Commonly known as: PROVENTIL HFA;VENTOLIN HFA  Inhale 2 puffs every eight (8) hours as needed for wheezing.     amiodarone 200 MG tablet  Commonly known as: PACERONE  Take 0.5 tablets (100 mg total) by mouth daily.     apixaban 5 mg Tab  Commonly known as: ELIQUIS  Take 0.5 tablets (2.5 mg total) by mouth Two (2) times a day.     estradioL 0.01 % (0.1 mg/gram) vaginal cream  Commonly known as: ESTRACE  INSERT ONE GRAM VAGINALLY AT BEDTIME     fosfomycin 3 gram Pack  Commonly known as: MONUROL  Take 3 g by mouth once a week.     lancets Misc  1 each by Miscellaneous route daily. Accu Check     metOLazone 5 MG tablet  Commonly known as: ZAROXOLYN  Take 1 tablet (5 mg total) by mouth daily as needed (when instructed by cardiology clinic).     montelukast 10 mg tablet  Commonly known as: SINGULAIR  Take 1 tablet (10 mg total) by mouth daily.     NARCAN 4 mg/actuation nasal spray  Generic drug: naloxone  1 spray into alternating nostrils once as needed (opioid overdose). PRN - Emergency use.     NON FORMULARY  APPLY FROM NECK DOWN TWICE A DAY     oxyCODONE 10 mg Tr12 12 hr crush resistant ER/CR tablet  Commonly known as: OxyCONTIN  Take 10 mg by mouth every twelve (12) hours.     OXYGEN-AIR DELIVERY SYSTEMS MISC  5 L by Miscellaneous route. 3 L/min via [place]     pantoprazole 20 MG tablet  Commonly known as: PROTONIX  Take 1 tablet (20 mg total) by mouth in the morning.     rosuvastatin 5 MG tablet  Commonly known as: CRESTOR  Take 1 tablet (5 mg total) by mouth every  other day.     SHINGRIX (PF) 50 mcg/0.5 mL Susr injection  Generic drug: varicella-zoster [id number] (PF)  Inject 0.5 mL into the muscle.     SPIRIVA RESPIMAT 2.5 mcg/actuation inhalation mist  Generic drug: tiotropium bromide  INHALE TWO PUFFS BY MOUTH ONCE DAILY     spironolactone 25 MG tablet  Commonly known as: ALDACTONE  Take 25 mg by mouth in the morning.     VYNDAMAX 61 mg Cap  Generic drug: tafamidis  Take 1 capsule (61 mg) by mouth daily.            Allergies:  Nitrofurantoin and Lipitor [atorvastatin]  ______________________________________________________________________  Pending Test Results (if blank, then none):      Most Recent Labs:  All lab results last 24 hours -   Recent Results (from the past 24 hour(s))   POCT Glucose    Collection Time: 12/01/21  1:34 PM   Result Value Ref Range    Glucose, POC 198 (H) 70 - 179 mg/dL   POCT Glucose    Collection Time: 12/01/21  4:27 PM   Result Value Ref Range    Glucose, POC 138 70 - 179 mg/dL   POCT Glucose    Collection Time: 12/01/21  7:48 PM   Result Value Ref Range    Glucose, POC 146 70 - 179 mg/dL   Basic metabolic panel    Collection Time: 12/02/21  3:47 AM   Result Value Ref Range    Sodium 135 135 - 145 mmol/L    Potassium 4.0 3.4 - 4.8 mmol/L    Chloride 94 (L) 98 - 107 mmol/L    CO2 37.0 (H) 20.0 - 31.0 mmol/L    Anion Gap 4 (L) 5 - 14 mmol/L    BUN 67 (H) 9 - 23 mg/dL    Creatinine 1.61 (H) 0.60 - 0.80 mg/dL    BUN/Creatinine Ratio 27     eGFR [name]-EPI (2021) Female 19 (L) >=60 mL/min/1.69m2    Glucose 128 70 - 179 mg/dL    Calcium 9.0 8.7 - 09.6 mg/dL   Magnesium Level    Collection Time: 12/02/21  3:47 AM   Result Value Ref Range    Magnesium 3.0 (H) 1.6 - 2.6 mg/dL   Phosphorus Level    Collection Time: 12/02/21  3:47 AM   Result Value Ref Range    Phosphorus 4.2 2.4 - 5.1 mg/dL   CBC    Collection Time: 12/02/21  3:47 AM   Result Value Ref Range    WBC 7.6 3.6 - 11.2 10*9/L    RBC 3.61 (L) 3.95 - 5.13 10*12/L    HGB 8.6 (L) 11.3 - 14.9 g/dL    HCT 04.5 (L) 40.9 - 44.0 %    MCV 75.5 (L) 77.6 - 95.7 fL    MCH 23.8 (L) 25.9 - 32.4 pg    MCHC 31.5 (L) 32.0 - 36.0 g/dL    RDW 81.1 (H) 91.4 - 15.2 %    MPV 7.9 6.8 - 10.7 fL    Platelet 270 150 - 450 10*9/L   POCT Glucose    Collection Time: 12/02/21  8:54 AM   Result Value Ref Range    Glucose, POC 126 70 - 179 mg/dL       Relevant Studies/Radiology (if blank, then none):  ECG 12 Lead    Result Date: 11/30/2021  SINUS RHYTHM WITH SINUS ARRHYTHMIA WITH 1ST DEGREE AV BLOCK LOW VOLTAGE QRS NONSPECIFIC ST AND T WAVE ABNORMALITY  ABNORMAL ECG WHEN COMPARED WITH ECG OF 03-Jul-2021 15:38, QUESTIONABLE CHANGE IN QRS AXIS NONSPECIFIC T WAVE ABNORMALITY, WORSE IN INFERIOR LEADS NONSPECIFIC T WAVE ABNORMALITY NOW EVIDENT IN ANTEROLATERAL LEADS Confirmed by Warnell Forester [phone]) on 11/30/2021 9:34:19 PM    XR Chest Portable    Result Date: 11/30/2021  EXAM: XR CHEST PORTABLE DATE: 11/29/2021 7:55 PM ACCESSION: [id number] UN DICTATED: 11/29/2021 8:33 PM INTERPRETATION LOCATION: [facility] CLINICAL INDICATION: 80 years old Female with SHORTNESS OF BREATH  COMPARISON: Chest radiograph 06/12/2021 TECHNIQUE: Portable Chest Radiograph. FINDINGS: Heterogeneous opacities at the lung bases appear slightly improved from prior exam. Central vascular congestion and more diffuse interstitial prominence is overall similar. No large pleural effusion. No pneumothorax. Enlarged cardiomediastinal silhouette, similar to prior.     Slightly improved bibasilar atelectasis. Central vascular congestion and mild interstitial edema appears similar.    ______________________________________________________________________  Discharge Instructions:   Activity Instructions     Activity as tolerated            Diet Instructions     Discharge diet (specify)      Discharge Nutrition Therapy: Heart Healthy          Other Instructions     Call MD for:      Weight gain > 5lbs in one week    Call MD for:  persistent nausea or vomiting      Call MD for:  severe uncontrolled pain      Call MD for: Temperature > 38.5 Celsius ( > 101.3 Fahrenheit)      Discharge instructions      YOu were admitted to the hospital for an exacerbation in your heart failure. We provided diuresis with IV lasix twice a day. Your weight is now trending back down.  On discharge you will resume your home diuretics.  On admission, your creatinine, which is a marker of your kidney function, was found to be elevated above your baseline. I suspect this was from your heart failure exacerbation. With improved diuresis, your creatinine is now trending back down.   Neurology was consulted to evaluate myclonic jerking/twitching or your extremities. Neuro felt this was most likely due to a combination of your medications and worsening kidney function. As your kidney function improved, the twitching improved.    Discharge instructions      Important Medications:  Torsemide, 40mg  in am, 20mg  in afternoon, and an extra 20mg  as needed for weight gain.  Metolazone 5mg  as needed for weight gain (per cardiologist instructions)  Gabapentin 300mg  nightly    Continue all your home medications as prescribed.    Discharge instructions      Follow up with Dr. Brooke Dare in the next 2-3 weeks for hospital follow up          Follow Up instructions and Outpatient Referrals     Ambulatory referral to Home Health      Is this a [facility] or [facility] Patient?: Yes    Home Health Options: Traditional Home Health    Is this patient at high risk for COVID 19 transmission and recommended to   stay at home during this pandemic?: No    If the patient has a diagnosis of heart failure and is already on oral   diuretics, do you want to activate the in home IV Lasix protocol?: No    Do you want agency provider parameter notifications or patient specific   provider parameter notifications?: Agency    Do you want to initiate remote patient  monitoring?: No    Physician to follow patient's care: PCP    Disciplines requested:  Physical Therapy  Occupational Therapy       Physical Therapy requested: Evaluate and treat    Occupational Therapy Requested: Evaluate and treat    Requested [facility] Date: 12/04/2021    Do you want ongoing co-management?: Yes    Care coordination required?: No    Call MD for:      Call MD for:  persistent nausea or vomiting      Call MD for:  severe uncontrolled pain      Call MD for: Temperature > 38.5 Celsius ( > 101.3 Fahrenheit)      Discharge instructions      Discharge instructions      Discharge instructions          Appointments which have been scheduled for you    Dec 18, 2021 10:00 AM  (Arrive by 9:45 AM)  RETURN VIDEO HCP MYCHART with Chriss Driver, MD  [facility] INFECTIOUS DISEASES [place] [facility] REGION) [address]  [place] [address]  [phone]   Please sign into My [place] Chart at least 15 minutes before your appointment to complete the eCheck-In process. You must complete eCheck-In before you can start your video visit. We also recommend testing your audio and video connection to troubleshoot any issues before your visit begins. Click ???Join Video Visit??? to complete these checks. Once you have completed eCheck-In and tested your audio and video, click ???Join Call??? to connect to your visit.     For your video visit, you will need a computer with a working camera, speaker and microphone, a smartphone, or a tablet with internet access.    My [place] Chart enables you to manage your health, send non-urgent messages to your provider, view your test results, schedule and manage appointments, and request prescription refills securely and conveniently from your computer or mobile device.    You can go to [email or web address] to sign in to your My [place] Chart account with your username and password. If you have forgotten your username or password, please choose the ???Forgot Username???? and/or ???Forgot Password???? links to gain access. You also can access your My [place] Chart account with the free MyChart mobile app for Android or iPhone.    If you need assistance accessing your My [place] Chart account or for assistance in reaching your provider's office to reschedule or cancel your appointment, please call [facility] [phone].       Jan 07, 2022  3:15 PM  (Arrive by 3:00 PM)  NEW NEUROLOGY with Enedina Finner, MD  [facility] INTERNAL MEDICINE [place] [facility]) [address]  [place] [address]  [phone]      Jan 16, 2022 11:30 AM  (Arrive by 11:15 AM)  RETURN HEART FAILURE [place] with Carin Hock, MD  [facility] CARDIOLOGY [place] [facility] [place]) [address]  [place] [address]  [phone]      Jan 22, 2022  1:30 PM  (Arrive by 1:15 PM)  RETURN  GENERAL with Abhijit Lonia Blood, MD  [facility] KIDNEY SPECIALTY AND TRANSPLANT CLINIC [place] [facility] REGION) [address]  [place] [address]  [phone]      Feb 02, 2022  2:00 PM  (Arrive by 1:45 PM)  XR DEXA BONE DENSITY SKELETAL with HBR DEXA RM 1  IMG DEXA HBR [facility]) [address]  [place] [address]  [phone]   No calcium supplements 24 hrs prior.     Feb 19, 2022  1:15 PM  (Arrive by 1:00 PM)  RETURN CONTINUITY with Artelia Laroche, MD  [facility] INTERNAL MEDICINE [place] [facility]) [address]  [place] [address]  [phone]      May 08, 2022  1:30 PM  (Arrive by 1:15 PM)  RETURN  PULM HYPERTENSION with Zannie Cove, MD  [facility] PULMONARY SPECIALTY CL [place] [facility]) [address]  [place] [address]  [phone]           ______________________________________________________________________  Discharge Day Services:  BP 120/70  - Pulse 61  - Temp 36.6 ??C (97.9 ??F) (Oral)  - Resp 24  - Ht 168 cm (5' 6.14)  - Wt 100.3 kg (221 lb 1.9 oz)  - SpO2 100%  - BMI 35.54 kg/m??   Pt seen on the day of discharge and determined appropriate for discharge.    Condition at Discharge: good    Length of Discharge: I spent greater than 30 mins in the discharge of this patient.

## 2021-12-03 ENCOUNTER — Encounter: Admit: 2021-12-03 | Payer: MEDICARE

## 2021-12-03 ENCOUNTER — Encounter: Admit: 2021-12-03 | Discharge: 2022-01-01 | Payer: MEDICARE

## 2021-12-03 ENCOUNTER — Ambulatory Visit: Admit: 2021-12-03 | Payer: MEDICARE

## 2021-12-03 ENCOUNTER — Inpatient Hospital Stay: Admit: 2021-12-03 | Payer: MEDICARE

## 2021-12-03 NOTE — Unmapped (Signed)
[facility] Refill Coordination Note    Specialty Medication(s) to be Shipped:   General Specialty: Vyndamax    Other medication(s) to be shipped: No additional medications requested for fill at this time     Barbara Huber, DOB: Dec 29, 1940  Phone: [phone] (home)       All above HIPAA information was verified with patient's family member, Daughter.     Was a Nurse, learning disability used for this call? No    Completed refill call assessment today to schedule patient's medication shipment from the [facility] [phone]).  All relevant notes have been reviewed.     Specialty medication(s) and dose(s) confirmed: Regimen is correct and unchanged.   Changes to medications: Barbara Huber reports no changes at this time.  Changes to insurance: No  New side effects reported not previously addressed with a pharmacist or physician: None reported  Questions for the pharmacist: No    Confirmed patient received a Conservation officer, historic buildings and a Surveyor, mining with first shipment. The patient will receive a drug information handout for each medication shipped and additional FDA Medication Guides as required.       DISEASE/MEDICATION-SPECIFIC INFORMATION        N/A    SPECIALTY MEDICATION ADHERENCE     Medication Adherence    Patient reported X missed doses in the last month: 0  Specialty Medication: Vydnamax 61mg   Patient is on additional specialty medications: No  Patient is on more than two specialty medications: No              Were doses missed due to medication being on hold? No    Vyndamax 61 mg/ml: 7-8 days of medicine on hand       REFERRAL TO PHARMACIST     Referral to the pharmacist: Not needed      [facility]     Shipping address confirmed in Epic.     Delivery Scheduled: Yes, Expected medication delivery date: 12/09/21.     Medication will be delivered via Next Day Courier to the prescription address in Epic WAM.    Nancy Nordmann [facility] Specialty Technician

## 2021-12-04 DIAGNOSIS — Z09 Encounter for follow-up examination after completed treatment for conditions other than malignant neoplasm: Principal | ICD-10-CM

## 2021-12-06 ENCOUNTER — Ambulatory Visit: Admit: 2021-12-06 | Discharge: 2021-12-14 | Payer: MEDICARE

## 2021-12-06 ENCOUNTER — Ambulatory Visit: Admit: 2021-12-06 | Discharge: 2021-12-14 | Disposition: A | Discharge status: Home Health | Payer: MEDICARE

## 2021-12-06 ENCOUNTER — Ambulatory Visit: Admit: 2021-12-06 | Payer: MEDICARE

## 2021-12-06 LAB — COMPREHENSIVE METABOLIC PANEL
ALBUMIN: 3.7 g/dL (ref 3.4–5.0)
ALBUMIN: 3.5 g/dL (ref 3.4–5.0)
ALKALINE PHOSPHATASE: 73 U/L (ref 46–116)
ALKALINE PHOSPHATASE: 74 U/L (ref 46–116)
ALT (SGPT): <7 U/L — ABNORMAL LOW (ref 10–49)
ALT (SGPT): <7 U/L — ABNORMAL LOW (ref 10–49)
ANION GAP: 8 mmol/L (ref 5–14)
ANION GAP: 11 mmol/L (ref 5–14)
AST (SGOT): 27 U/L (ref <=34)
AST (SGOT): 12 U/L (ref <=34)
BILIRUBIN TOTAL: 0.4 mg/dL (ref 0.3–1.2)
BILIRUBIN TOTAL: 0.6 mg/dL (ref 0.3–1.2)
BLOOD UREA NITROGEN: 87 mg/dL — ABNORMAL HIGH (ref 9–23)
BLOOD UREA NITROGEN: 95 mg/dL — ABNORMAL HIGH (ref 9–23)
BUN / CREAT RATIO: 19
BUN / CREAT RATIO: 19
CALCIUM: 8.8 mg/dL (ref 8.7–10.4)
CALCIUM: 9.5 mg/dL (ref 8.7–10.4)
CHLORIDE: 89 mmol/L — ABNORMAL LOW (ref 98–107)
CHLORIDE: 89 mmol/L — ABNORMAL LOW (ref 98–107)
CO2: 32.8 mmol/L — ABNORMAL HIGH (ref 20.0–31.0)
CO2: 31 mmol/L (ref 20.0–31.0)
CREATININE: 4.6 mg/dL — ABNORMAL HIGH
CREATININE: 4.89 mg/dL — ABNORMAL HIGH
EGFR CKD-EPI (2021) FEMALE: 9 mL/min/{1.73_m2} — ABNORMAL LOW (ref >=60)
EGFR CKD-EPI (2021) FEMALE: 8 mL/min/{1.73_m2} — ABNORMAL LOW (ref >=60)
GLUCOSE RANDOM: 240 mg/dL — ABNORMAL HIGH (ref 70–179)
GLUCOSE RANDOM: 139 mg/dL (ref 70–179)
POTASSIUM: 5.6 mmol/L — ABNORMAL HIGH (ref 3.4–4.8)
POTASSIUM: 4.8 mmol/L (ref 3.4–4.8)
PROTEIN TOTAL: 8 g/dL (ref 5.7–8.2)
PROTEIN TOTAL: 7.9 g/dL (ref 5.7–8.2)
SODIUM: 130 mmol/L — ABNORMAL LOW (ref 135–145)
SODIUM: 131 mmol/L — ABNORMAL LOW (ref 135–145)

## 2021-12-06 LAB — HIGH SENSITIVITY TROPONIN I - 2H/6H SERIAL
HIGH SENSITIVITY TROPONIN - DELTA (0-2H): 18 ng/L — ABNORMAL HIGH (ref <=7)
HIGH-SENSITIVITY TROPONIN I - 2 HOUR: 123 ng/L (ref <=34)

## 2021-12-06 LAB — LIPID PANEL
CHOLESTEROL/HDL RATIO SCREEN: 2.3 (ref 1.0–4.5)
CHOLESTEROL: 95 mg/dL (ref <=200)
HDL CHOLESTEROL: 42 mg/dL (ref 40–60)
LDL CHOLESTEROL CALCULATED: 40 mg/dL (ref 40–99)
NON-HDL CHOLESTEROL: 53 mg/dL — ABNORMAL LOW (ref 70–130)
TRIGLYCERIDES: 66 mg/dL (ref 0–150)
VLDL CHOLESTEROL CAL: 13.2 mg/dL (ref 11–41)

## 2021-12-06 LAB — CBC
HEMATOCRIT: 28.9 % — ABNORMAL LOW (ref 34.0–44.0)
HEMOGLOBIN: 8.9 g/dL — ABNORMAL LOW (ref 11.3–14.9)
MEAN CORPUSCULAR HEMOGLOBIN CONC: 30.9 g/dL — ABNORMAL LOW (ref 32.0–36.0)
MEAN CORPUSCULAR HEMOGLOBIN: 23.2 pg — ABNORMAL LOW (ref 25.9–32.4)
MEAN CORPUSCULAR VOLUME: 75.1 fL — ABNORMAL LOW (ref 77.6–95.7)
MEAN PLATELET VOLUME: 8 fL (ref 6.8–10.7)
PLATELET COUNT: 302 10*9/L (ref 150–450)
RED BLOOD CELL COUNT: 3.84 10*12/L — ABNORMAL LOW (ref 3.95–5.13)
RED CELL DISTRIBUTION WIDTH: 17.5 % — ABNORMAL HIGH (ref 12.2–15.2)
WBC ADJUSTED: 7.9 10*9/L (ref 3.6–11.2)

## 2021-12-06 LAB — URINALYSIS WITH CULTURE REFLEX
BILIRUBIN UA: NEGATIVE
BLOOD UA: NEGATIVE
GLUCOSE UA: NEGATIVE
HYALINE CASTS: 33 /LPF — ABNORMAL HIGH (ref 0–1)
KETONES UA: NEGATIVE
NITRITE UA: NEGATIVE
PH UA: 5 (ref 5.0–9.0)
RBC UA: <1 /HPF (ref <=4)
SPECIFIC GRAVITY UA: 1.014 (ref 1.003–1.030)
SQUAMOUS EPITHELIAL: 13 /HPF — ABNORMAL HIGH (ref 0–5)
UROBILINOGEN UA: 2 — AB
WBC UA: 26 /HPF — ABNORMAL HIGH (ref 0–5)

## 2021-12-06 LAB — CBC W/ AUTO DIFF
BASOPHILS ABSOLUTE COUNT: 0.1 10*9/L (ref 0.0–0.1)
BASOPHILS RELATIVE PERCENT: 0.7 %
EOSINOPHILS ABSOLUTE COUNT: 0.1 10*9/L (ref 0.0–0.5)
EOSINOPHILS RELATIVE PERCENT: 0.5 %
HEMATOCRIT: 28.8 % — ABNORMAL LOW (ref 34.0–44.0)
HEMOGLOBIN: 8.8 g/dL — ABNORMAL LOW (ref 11.3–14.9)
LYMPHOCYTES ABSOLUTE COUNT: 0.7 10*9/L — ABNORMAL LOW (ref 1.1–3.6)
LYMPHOCYTES RELATIVE PERCENT: 6.5 %
MEAN CORPUSCULAR HEMOGLOBIN CONC: 30.4 g/dL — ABNORMAL LOW (ref 32.0–36.0)
MEAN CORPUSCULAR HEMOGLOBIN: 23 pg — ABNORMAL LOW (ref 25.9–32.4)
MEAN CORPUSCULAR VOLUME: 75.5 fL — ABNORMAL LOW (ref 77.6–95.7)
MEAN PLATELET VOLUME: 8.1 fL (ref 6.8–10.7)
MONOCYTES ABSOLUTE COUNT: 1 10*9/L — ABNORMAL HIGH (ref 0.3–0.8)
MONOCYTES RELATIVE PERCENT: 9.5 %
NEUTROPHILS ABSOLUTE COUNT: 8.7 10*9/L — ABNORMAL HIGH (ref 1.8–7.8)
NEUTROPHILS RELATIVE PERCENT: 82.8 %
NUCLEATED RED BLOOD CELLS: 0 /100{WBCs} (ref <=4)
PLATELET COUNT: 321 10*9/L (ref 150–450)
RED BLOOD CELL COUNT: 3.82 10*12/L — ABNORMAL LOW (ref 3.95–5.13)
RED CELL DISTRIBUTION WIDTH: 18 % — ABNORMAL HIGH (ref 12.2–15.2)
WBC ADJUSTED: 10.5 10*9/L (ref 3.6–11.2)

## 2021-12-06 LAB — MAGNESIUM
MAGNESIUM: 4.1 mg/dL — ABNORMAL HIGH (ref 1.6–2.6)
MAGNESIUM: 4.5 mg/dL — ABNORMAL HIGH (ref 1.6–2.6)

## 2021-12-06 LAB — TSH: THYROID STIMULATING HORMONE: 2.731 u[IU]/mL (ref 0.550–4.780)

## 2021-12-06 LAB — HIGH SENSITIVITY TROPONIN I - SINGLE: HIGH SENSITIVITY TROPONIN I: 105 ng/L (ref <=34)

## 2021-12-06 LAB — B-TYPE NATRIURETIC PEPTIDE: B-TYPE NATRIURETIC PEPTIDE: 853.9 pg/mL — ABNORMAL HIGH (ref <=100)

## 2021-12-06 LAB — HEMOGLOBIN A1C
ESTIMATED AVERAGE GLUCOSE: 154 mg/dL
HEMOGLOBIN A1C: 7 % — ABNORMAL HIGH (ref 4.8–5.6)

## 2021-12-06 MED ADMIN — dextrose 50 % in water (D50W) 50 % solution 25 g: 25 g | INTRAVENOUS | @ 22:00:00 | Stop: 2021-12-06

## 2021-12-06 MED ADMIN — aspirin chewable tablet 324 mg: 324 mg | ORAL | @ 22:00:00 | Stop: 2021-12-06

## 2021-12-06 MED ADMIN — calcium gluconate 2 g in sodium chloride (NS) 0.9 % 100 mL IVPB: 2 g | INTRAVENOUS | @ 23:00:00 | Stop: 2021-12-06

## 2021-12-06 MED ADMIN — insulin regular (HumuLIN,NovoLIN) injection 5 Units: 5 [IU] | INTRAVENOUS | @ 22:00:00 | Stop: 2021-12-06

## 2021-12-06 NOTE — Unmapped (Addendum)
Patient presents here with hyperglycemia with BG 225/ 300 this morning. C/o increased fatigue and sleep since her last admission a week ago fro exacerbation of CHF. C/o abdominal pain with no N/V/D. Denies any other symptoms. Uses home o2 3.5 litres/ min. Denies any increased swelling in her lower extremities. States that she was on metformin before she was admitted to the hospital and was given insulin during admission, was not sent on any meds for her DM when  she was discharged

## 2021-12-06 NOTE — Unmapped (Signed)
[facility] Inpatient Emergency Department Provider Note      ED Course, Assessment, and Plan     ED Course as of 12/09/21 7829   Sat Dec 06, 2021   3918 80 year old female who presents to the ED for feeling unwell with decreased appetite and general fatigue, as well as hyperglycemia without medications at home, over the past few days since being discharged from recent hospital admission for CHF exacerbation.  Vitals overall unremarkable.  On exam patient is in no distress, lungs with bilateral crackles, normal work of breathing on supplemental oxygen 3.5 L which is her baseline, abdomen soft and nontender, faint bilateral lower extremity edema, mentating appropriately and following all commands, normal strength and sensation all extremities, ambulating with assistance.    Patient denies any shortness of breath or chest pain, and no significant LE edema or JVD to suggest acute exacerbation of heart failure or pneumonia.  No other focal symptoms to suggest infectious process, however given her generalized fatigue and decreased appetite we will plan for labs, UA, chest x-ray, EKG/troponin.   1716 EKG shows heart rate of 59 regular rhythm, possible complete heart block.  Will reach out to cardiology for further review and recommendations.  Patient's lab work shows hypokalemia and hypomagnesemia, will give insulin/dextrose and calcium.  Concern for cardio renal syndrome given new AKI.   33 Spoke with cardiology fellow Dr. Caroline Sauger, he is recommending admission to [facility] in [facility] so she can be evaluated by cardiology team.   1828 MAO reached out to me, requesting the patient be transferred ED to ED as her no beds available for placement.   26 Spoke with Dr. Marcelyn Bruins, patient accepted for ED to ED transfer.     _____________________________________________________________________    The case was discussed with the attending physician who is in agreement with the above assessment and plan.    Additional Medical Decision Making     The patient's vital signs, EKG tracing, and pertinent labs and imaging results that were available during my care of the patient have been independently reviewed by me and considered in my medical decision making. I have reviewed the patient's prior medical records where available.    History     Chief Complaint:   Chief Complaint   Patient presents with   ??? Elevated Blood Glucose Symptomatic       History of Present Illness:  Barbara Huber is a 80 y.o. female with a history of CHF, chronic Afib, stage IV [name], COPD (on 3.5L at-home at baseline), HTN, T2DM, peripheral neuropathy, anemia, chronic opiate use, DOE, and chronic amyloidosis who is presenting with elevated blood glucose. Per chart review, patient was recently admitted on 11/29/21 for [name] exacerbation after presenting to the [facility] ED with weakness, shortness of breath, and recent weight gain of roughly 8 pounds. BNP was elevated from baseline. She was started on 40mg  IV Lasix BID and given 5mg  metolazone. Patient's creatinine was also elevated to 3.15 from baseline of 2.5. Patient's weight eventually downtrended, as well as her creatinine and BUN. Patient additionally presented with myoclonic jerks present on exam, with unclear cause. Patient was discharged 3 days later with recommendation to resume home diuretics, amiodarone, apixaban, and  recommendation for neurology follow-up. Today, patient reports she woke up this morning with a BG reading of 225-300. She states since her admission last week, she has felt increased fatigue, nausea, decreased PO intake 2/2 nausea, and intermittent shortness of breath with exertion. She denies increased lower  extremity edema from baseline. Patient's daughter at bedside notes patient has been having increased BG readings for the past few days. She notes patient was treated on sliding scale insulin during her admission, but was not discharged with any medications for her T2DM. She endorses compliance with her daily medications, including diuretics. Patient uses a walker at baseline. She denies chest pain, cough, dysuria, blood in urine, fever, chills, or abdominal pain.     Past Medical History:  Past Medical History:   Diagnosis Date   ??? Acute kidney injury superimposed on chronic kidney disease (CMS-HCC) 10/11/2020   ??? Acute on chronic diastolic (congestive) heart failure (CMS-HCC) 08/23/2020   ??? Arthritis    ??? Calculus of kidney    ??? Calculus of ureter    ??? CHF (congestive heart failure) (CMS-HCC)    ??? Chronic atrial fibrillation (CMS-HCC) 07/20/2019   ??? COPD (chronic obstructive pulmonary disease) (CMS-HCC)    ??? Diabetes (CMS-HCC)    ??? Gangrenous cholecystitis 10/11/2020   ??? GERD (gastroesophageal reflux disease)    ??? Hydronephrosis    ??? Hypertension    ??? Hyponatremia 10/11/2020   ??? Intermediate coronary syndrome (CMS-HCC) 03/13/2014   ??? Lower extremity edema 09/28/2020   ??? Lumbar stenosis    ??? Microscopic hematuria    ??? Nausea alone    ??? Nephrolithiasis 04/17/2016   ??? Neuropathy    ??? Nocturia    ??? Other chronic cystitis    ??? Pulmonary hypertension (CMS-HCC)    ??? Renal colic    ??? Sleep apnea    ??? Unstable angina pectoris (CMS-HCC) 03/13/2014       Medications:     Current Facility-Administered Medications:   ???  acetaminophen (TYLENOL) tablet 650 mg, 650 mg, Oral, Q6H PRN, Kerin Perna, MD, 650 mg at 12/08/21 0644  ???  albuterol (PROVENTIL HFA;VENTOLIN HFA) 90 mcg/actuation inhaler 2 puff, 2 puff, Inhalation, Q8H PRN, Kerin Perna, MD  ???  amiodarone (PACERONE) tablet 100 mg, 100 mg, Oral, Daily, Kerin Perna, MD, 100 mg at 12/08/21 1000  ???  fluticasone furoate-vilanteroL (BREO ELLIPTA) 100-25 mcg/dose inhaler 1 puff, 1 puff, Inhalation, Daily (RT), Kerin Perna, MD, 1 puff at 12/08/21 0741  ???  furosemide (LASIX) 120 mg in sodium chloride (NS) 0.9 % 50 mL IVPB, 120 mg, Intravenous, BID, Damien Fusi, MD, Stopped at 12/09/21 0615  ???  melatonin tablet 3 mg, 3 mg, Oral, Nightly PRN, Kerin Perna, MD  ???  metOLazone (ZAROXOLYN) tablet 10 mg, 10 mg, Oral, Daily, Damien Fusi, MD, 10 mg at 12/08/21 1200  ???  montelukast (SINGULAIR) tablet 10 mg, 10 mg, Oral, Daily, Kerin Perna, MD, 10 mg at 12/08/21 0900  ???  ondansetron (ZOFRAN) tablet 4 mg, 4 mg, Oral, Q8H PRN, Kerin Perna, MD  ???  oxyCODONE (OxyCONTIN) 12 hr crush resistant ER/CR tablet 10 mg, 10 mg, Oral, Q12H PRN, Kerin Perna, MD, 10 mg at 12/08/21 2122  ???  pantoprazole (PROTONIX) EC tablet 20 mg, 20 mg, Oral, Daily, Kerin Perna, MD, 20 mg at 12/08/21 0959  ???  polyethylene glycol (MIRALAX) packet 17 g, 17 g, Oral, Daily PRN, Kerin Perna, MD  ???  pravastatin (PRAVACHOL) tablet 10 mg, 10 mg, Oral, Daily, Kerin Perna, MD, 10 mg at 12/08/21 1000  ???  senna (SENOKOT) tablet 2 tablet, 2 tablet, Oral, Nightly PRN, Kerin Perna, MD  ???  simethicone (MYLICON) chewable tablet 80 mg, 80 mg, Oral, Q6H PRN, Kerin Perna, MD  ???  spironolactone (ALDACTONE) tablet 25  mg, 25 mg, Oral, Daily, Yehuda Savannah, MD, 25 mg at 12/08/21 1000  ???  Tafamidis 61 mg capsule - **PATIENT SUPPLIED MEDICATION**, 61 mg, Oral, Daily, Kerin Perna, MD, 61 mg at 12/08/21 1000  ???  umeclidinium (INCRUSE ELLIPTA) 62.5 mcg/actuation inhaler 1 puff, 1 puff, Inhalation, Daily (RT), Kerin Perna, MD, 1 puff at 12/08/21 0740  Current Discharge Medication List      CONTINUE these medications which have NOT CHANGED    Details   ACCU-CHEK AVIVA PLUS TEST STRP Strp UESE TO CHECK BLOOD SUGAR 3 TIMES A DAY BEFORE MEALS  Qty: 100 each, Refills: 11      acetaminophen (TYLENOL) 500 MG tablet Take 1,000 mg by mouth daily as needed for pain.       ADVAIR HFA 115-21 mcg/actuation inhaler INHALE TWO PUFFS BY MOUTH TWICE A DAY  Qty: 12 g, Refills: 11    Comments: This prescription was filled on 11/26/2021. Any refills authorized will be placed on file.      albuterol HFA 90 mcg/actuation inhaler Inhale 2 puffs every eight (8) hours as needed for wheezing.      amiodarone (PACERONE) 200 MG tablet Take 0.5 tablets (100 mg total) by mouth daily.  Qty: 45 tablet, Refills: 3      apixaban (ELIQUIS) 5 mg Tab Take 0.5 tablets (2.5 mg total) by mouth Two (2) times a day.  Qty: 60 tablet, Refills: 11      cranberry 500 mg cap Take 500 mg by mouth daily with evening meal.      estradioL (ESTRACE) 0.01 % (0.1 mg/gram) vaginal cream INSERT ONE GRAM VAGINALLY AT BEDTIME      fosfomycin (MONUROL) 3 gram Pack Take 3 g by mouth once a week.  Qty: 36 g, Refills: 0    Associated Diagnoses: Recurrent UTI (urinary tract infection)      gabapentin (NEURONTIN) 300 MG capsule Take 1 capsule (300 mg total) by mouth at bedtime.  Qty: 90 capsule, Refills: 3    Associated Diagnoses: Neuropathy      lancets Misc 1 each by Miscellaneous route daily. Accu Check  Qty: 100 each, Refills: 6    Associated Diagnoses: Type 2 diabetes mellitus without complication, without long-term current use of insulin (CMS-HCC)      metOLazone (ZAROXOLYN) 5 MG tablet Take 1 tablet (5 mg total) by mouth daily as needed (when instructed by cardiology clinic).  Qty: 30 tablet, Refills: 1      montelukast (SINGULAIR) 10 mg tablet Take 1 tablet (10 mg total) by mouth daily.  Qty: 90 tablet, Refills: 3    Associated Diagnoses: Seasonal allergies      NARCAN 4 mg/actuation nasal spray 1 spray into alternating nostrils once as needed (opioid overdose). PRN - Emergency use.      NON FORMULARY APPLY FROM NECK DOWN TWICE A DAY      oxyCODONE (OXYCONTIN) 10 mg TR12 12 hr crush resistant ER/CR tablet Take 10 mg by mouth every twelve (12) hours.      OXYGEN-AIR DELIVERY SYSTEMS MISC 5 L by Miscellaneous route. Currently using 3  L/min via [place]      pantoprazole (PROTONIX) 20 MG tablet Take 1 tablet (20 mg total) by mouth in the morning.  Qty: 60 tablet, Refills: 5    Associated Diagnoses: Dyspepsia      rosuvastatin (CRESTOR) 5 MG tablet Take 1 tablet (5 mg total) by mouth every other day.  Qty: 15 tablet, Refills: 11    Comments: This replaces  prior prescription. Frequency has been changed to every other day.  Associated Diagnoses: Hospital discharge follow-up      SPIRIVA RESPIMAT 2.5 mcg/actuation inhalation mist INHALE TWO PUFFS BY MOUTH ONCE DAILY  Qty: 4 g, Refills: 11    Comments: This prescription was filled on 11/26/2021. Any refills authorized will be placed on file.      spironolactone (ALDACTONE) 25 MG tablet Take 25 mg by mouth in the morning.      tafamidis 61 mg cap Take 1 capsule (61 mg) by mouth daily.  Qty: 30 capsule, Refills: 11    Associated Diagnoses: Cardiac amyloidosis (CMS-HCC)      torsemide (DEMADEX) 20 MG tablet Take 2 tablets (40 mg total) by mouth every morning AND 1 tablet (20 mg total) daily after lunch. May also take 1 tablet (20 mg total) daily as needed (weight gain).  Qty: 60 tablet, Refills: 11      varicella-zoster [id number], PF, (SHINGRIX, PF,) 50 mcg/0.5 mL SusR injection Inject 0.5 mL into the muscle.  Qty: 0.5 mL, Refills: 1             Allergies:   Nitrofurantoin and Lipitor [atorvastatin]    Past Surgical History:   Past Surgical History:   Procedure Laterality Date   ??? BACK SURGERY  1995   ??? CARPAL TUNNEL RELEASE Left 2014   ??? HYSTERECTOMY  1971   ??? IR INSERT CHOLECYSTOSMY TUBE PERCUTANEOUS  10/02/2020    IR INSERT CHOLECYSTOSMY TUBE PERCUTANEOUS 10/02/2020 Braulio Conte, MD IMG VIR [facility]   ??? LUMBAR DISC SURGERY     ??? PR REMOVAL GALLBLADDER N/A 10/06/2020    Procedure: CHOLECYSTECTOMY;  Surgeon: Katherina Mires, MD;  Location: MAIN OR [facility];  Service: Trauma   ??? PR RIGHT HEART CATH O2 SATURATION & CARDIAC OUTPUT N/A 09/30/2020    Procedure: Right Heart Catheterization;  Surgeon: Neal Dy, MD;  Location: [facility] CATH;  Service: Cardiology   ??? PR RIGHT HEART CATH O2 SATURATION & CARDIAC OUTPUT N/A 01/09/2021    Procedure: Right Heart Catheterization;  Surgeon: Lesle Reek, MD;  Location: [facility] CATH;  Service: Cardiology       Social History:   Social History     Tobacco Use   ??? Smoking status: Former     Packs/day: 0.33     Types: Cigarettes     Quit date: 2019     Years since quitting: 3.9   ??? Smokeless tobacco: Never   ??? Tobacco comments:     Quit a few years ago   Substance Use Topics   ??? Alcohol use: No       Family History:  Family History   Problem Relation Age of Onset   ??? Hypertension Mother    ??? Cancer Father         COLON CANCER   ??? Anesthesia problems Neg Hx    ??? Broken bones Neg Hx    ??? Clotting disorder Neg Hx    ??? Collagen disease Neg Hx    ??? Diabetes Neg Hx    ??? Dislocations Neg Hx    ??? Fibromyalgia Neg Hx    ??? Gout Neg Hx    ??? Hemophilia Neg Hx    ??? Osteoporosis Neg Hx    ??? Rheumatologic disease Neg Hx    ??? Scoliosis Neg Hx    ??? Severe sprains Neg Hx    ??? Sickle cell anemia Neg Hx    ??? Spinal Compression  Fracture Neg Hx    ??? GU problems Neg Hx    ??? Kidney cancer Neg Hx    ??? Prostate cancer Neg Hx         Review of Systems:  10-point ROS obtained and otherwise negative except as noted in HPI.      Physical Exam     Vital Signs:    BP 98/62  - Pulse 57  - Temp 36.5 ??C (97.7 ??F) (Oral)  - Resp 18  - Wt 95.9 kg (211 lb 6.7 oz)  - SpO2 90%  - BMI 34.12 kg/m??     General: Alert and oriented, in no distress.  Skin: Skin is warm and dry.  HEENT: Normocephalic and atraumatic, moist mucous membranes, no nasal drainage, no intra-oral lesions or erythema.  Lungs: Normal respiratory effort on supplemental O2. Crackles bilaterally.  Heart: Regular rhythm, normal heart sounds.  Abdomen: Soft and non-tender to palpation.  Genitourinary/Rectal: Deferred  Musculoskeletal: 1+ BLE edema, no deformities or tenderness.  Lymphatic: No cervical or supraclavicular lymphadenopathy noted.  Neurological: Normal speech and language.??No gross focal neurologic deficits are appreciated.  Psychiatric: Normal affect and behavior for situation    Radiology     US Renal Complete   Final Result   -No hydronephrosis. Mild distention of the right renal pelvis is similar to prior CT.      -Mildly increased echogenicity of the kidneys, which is nonspecific, but may be related to medical renal disease.         XR Chest 1 view Portable   Final Result      Mildly increased pulmonary edema.      Echocardiogram W Colorflow Spectral Doppler    (Results Pending)       Labs     Labs Reviewed   URINE CULTURE - Abnormal; Notable for the following components:       Result Value    Urine Culture, Comprehensive 10,000 to 50,000 CFU/mL Klebsiella pneumoniae (*)     Urine Culture, Comprehensive 50,000 to 100,000 CFU/mL Streptococcus bovis group (*)     All other components within normal limits    Narrative:     Specimen Source: Clean Catch   COMPREHENSIVE METABOLIC PANEL - Abnormal; Notable for the following components:    Sodium 130 (*)     Potassium 5.6 (*)     Chloride 89 (*)     CO2 32.8 (*)     BUN 87 (*)     Creatinine 4.60 (*)     eGFR [name]-EPI (2021) Female 9 (*)     Glucose 240 (*)     ALT <7 (*)     All other components within normal limits   B-TYPE NATRIURETIC PEPTIDE - Abnormal; Notable for the following components:    BNP 853.90 (*)     All other components within normal limits   URINALYSIS WITH CULTURE REFLEX - Abnormal; Notable for the following components:    Leukocyte Esterase, UA Moderate (*)     Protein, UA Trace (*)     Urobilinogen, UA 2.0 mg/dL (*)     WBC, UA 26 (*)     Squam Epithel, UA 13 (*)     Bacteria, UA Few (*)     Hyaline Casts, UA 33 (*)     Mucus, UA Rare (*)     All other components within normal limits   MAGNESIUM - Abnormal; Notable for the following components:    Magnesium 4.1 (*)  All other components within normal limits   HIGH SENSITIVITY TROPONIN I - SINGLE - Abnormal; Notable for the following components:    hsTroponin I 105 (*)     All other components within normal limits   HIGH SENSITIVITY TROPONIN I - 2H/6H SERIAL - Abnormal; Notable for the following components:    hsTroponin I 123 (*)     delta hsTroponin I 18 (*)     All other components within normal limits   CBC - Abnormal; Notable for the following components:    RBC 3.84 (*)     HGB 8.9 (*)     HCT 28.9 (*)     MCV 75.1 (*)     MCH 23.2 (*)     MCHC 30.9 (*)     RDW 17.5 (*)     All other components within normal limits   COMPREHENSIVE METABOLIC PANEL - Abnormal; Notable for the following components:    Sodium 131 (*)     Chloride 89 (*)     BUN 95 (*)     Creatinine 4.89 (*)     eGFR [name]-EPI (2021) Female 8 (*)     ALT <7 (*)     All other components within normal limits   MAGNESIUM - Abnormal; Notable for the following components:    Magnesium 4.5 (*)     All other components within normal limits   HEMOGLOBIN A1C - Abnormal; Notable for the following components:    Hemoglobin A1C 7.0 (*)     All other components within normal limits    Narrative:     Screening or Diagnosis of Diabetes Mellitus*   A1c Reference Interval        Interpretation  4.8 - 5.6                     Normal  5.7 - 6.4                     Dysglycemia  >6.4                          Diabetes Mellitus    *Not recommended for diagnosis of diabetes in children with Cystic Fibrosis or with symptoms suggestive of acute onset type 1 diabetes.        A1c Glycemic Goal: <7.0 %    **Goals should be individualized; more or less stringent A1c glycemic goals may be appropriate for individual patients.   (Adopted from: 2020 ADA Standards of Medical Care In Diabetes)      LIPID PANEL - Abnormal; Notable for the following components:    Non-HDL Cholesterol 53 (*)     All other components within normal limits   HIGH SENSITIVITY TROPONIN I - SINGLE - Abnormal; Notable for the following components:    hsTroponin I 114 (*)     All other components within normal limits   BASIC METABOLIC PANEL - Abnormal; Notable for the following components:    Sodium 132 (*)     Chloride 90 (*)     CO2 32.0 (*)     BUN 109 (*)     Creatinine 4.94 (*)     eGFR [name]-EPI (2021) Female 8 (*)     All other components within normal limits   MAGNESIUM - Abnormal; Notable for the following components: Magnesium 4.4 (*)     All other components within normal limits   CBC - Abnormal; Notable  for the following components:    RBC 3.89 (*)     HGB 8.7 (*)     HCT 29.3 (*)     MCV 75.3 (*)     MCH 22.5 (*)     MCHC 29.8 (*)     RDW 17.5 (*)     All other components within normal limits   IRON PANEL - Abnormal; Notable for the following components:    Iron 12 (*)     Iron Saturation (%) 4 (*)     All other components within normal limits   HIGH SENSITIVITY TROPONIN I - SINGLE - Abnormal; Notable for the following components:    hsTroponin I 122 (*)     All other components within normal limits   PHOSPHORUS - Abnormal; Notable for the following components:    Phosphorus 5.9 (*)     All other components within normal limits   BASIC METABOLIC PANEL - Abnormal; Notable for the following components:    Sodium 129 (*)     Potassium 5.0 (*)     Chloride 89 (*)     CO2 32.0 (*)     BUN 95 (*)     Creatinine 4.37 (*)     eGFR [name]-EPI (2021) Female 10 (*)     All other components within normal limits   MAGNESIUM - Abnormal; Notable for the following components:    Magnesium 4.1 (*)     All other components within normal limits   BASIC METABOLIC PANEL - Abnormal; Notable for the following components:    Sodium 131 (*)     Potassium 5.0 (*)     Chloride 90 (*)     CO2 33.0 (*)     BUN 103 (*)     Creatinine 4.32 (*)     eGFR [name]-EPI (2021) Female 10 (*)     Calcium 8.6 (*)     All other components within normal limits   MAGNESIUM - Abnormal; Notable for the following components:    Magnesium 4.1 (*)     All other components within normal limits   CBC - Abnormal; Notable for the following components:    RBC 3.52 (*)     HGB 7.9 (*)     HCT 26.3 (*)     MCV 74.7 (*)     MCH 22.5 (*)     MCHC 30.2 (*)     RDW 17.3 (*)     All other components within normal limits   PHOSPHORUS - Abnormal; Notable for the following components:    Phosphorus 6.5 (*)     All other components within normal limits   BASIC METABOLIC PANEL - Abnormal; Notable for the following components:    Sodium 128 (*)     Chloride 86 (*)     BUN 105 (*)     Creatinine 4.12 (*)     eGFR [name]-EPI (2021) Female 10 (*)     Glucose 180 (*)     Calcium 8.6 (*)     All other components within normal limits   MAGNESIUM - Abnormal; Notable for the following components:    Magnesium 3.9 (*)     All other components within normal limits   PHOSPHORUS - Abnormal; Notable for the following components:    Phosphorus 5.9 (*)     All other components within normal limits   HEMOGLOBIN AND HEMATOCRIT, BLOOD - Abnormal; Notable for the following components:    HGB 8.0 (*)  HCT 26.8 (*)     All other components within normal limits   CBC - Abnormal; Notable for the following components:    RBC 3.20 (*)     HGB 7.3 (*)     HCT 23.7 (*)     MCV 74.1 (*)     MCH 22.9 (*)     MCHC 30.9 (*)     RDW 17.5 (*)     All other components within normal limits   COMPREHENSIVE METABOLIC PANEL - Abnormal; Notable for the following components:    Sodium 131 (*)     Chloride 89 (*)     CO2 33.0 (*)     BUN 87 (*)     Creatinine 3.85 (*)     eGFR [name]-EPI (2021) Female 11 (*)     Calcium 8.4 (*)     Albumin 3.1 (*)     ALT <7 (*)     All other components within normal limits   BASIC METABOLIC PANEL - Abnormal; Notable for the following components:    Sodium 132 (*)     Chloride 89 (*)     CO2 33.0 (*)     BUN 114 (*)     Creatinine 3.72 (*)     eGFR [name]-EPI (2021) Female 12 (*)     All other components within normal limits   MAGNESIUM - Abnormal; Notable for the following components:    Magnesium 3.8 (*)     All other components within normal limits   PHOSPHORUS - Abnormal; Notable for the following components:    Phosphorus 5.4 (*)     All other components within normal limits   POCT GLUCOSE, INTERFACED - Abnormal; Notable for the following components:    Glucose, POC 197 (*)     All other components within normal limits   CBC W/ AUTO DIFF - Abnormal; Notable for the following components:    RBC 3.82 (*)     HGB 8.8 (*)     HCT 28.8 (*)     MCV 75.5 (*)     MCH 23.0 (*)     MCHC 30.4 (*)     RDW 18.0 (*)     Absolute Neutrophils 8.7 (*)     Absolute Lymphocytes 0.7 (*)     Absolute Monocytes 1.0 (*)     Microcytosis Slight (*)     Anisocytosis Slight (*)     All other components within normal limits   INFLUENZA/RSV/COVID PCR - Normal    Narrative:     This test was performed using the Cepheid Xpert Xpress SARS-CoV-2/Flu/RSV plus assay, which has been validated by the CLIA-certified, CAP-inspected [facility]. FDA has granted Emergency Use Authorization for this test. Negative results do not preclude infection and should be interpreted along with clinical observations, patient history, and epidemiological information. Information for providers and patients can be found here: [email or web address]   TSH - Normal   FERRITIN - Normal   LACTATE, VENOUS, WHOLE BLOOD - Normal   POCT GLUCOSE, INTERFACED - Normal   CBC W/ DIFFERENTIAL    Narrative:     The following orders were created for panel order CBC w/ Differential.  Procedure                               Abnormality         Status                     ---------                               -----------         ------  CBC w/ [id number]]         Abnormal            Final result                 Please view results for these tests on the individual orders.   SODIUM, URINE, RANDOM   CREATININE, URINE   UREA NITROGEN, URINE   POTASSIUM, URINE, RANDOM     _____________________________________________________________________    Please note - This documentation was generated using dictation and/or voice recognition software, and as such, may contain spelling or other transcription errors. Any questions regarding the content of this documentation should be directed to the individual who electronically signed.      Documentation assistance was provided by Marlyn Corporal, Scribe on December 06, 2021 at 3:44 PM for Margo Aye, MD.      Documentation assistance was provided by the scribe in my presence.  The documentation recorded by the scribe has been reviewed by me and accurately reflects the services I personally performed.         Anders Grant, MD  Resident  12/09/21 [phone]

## 2021-12-07 LAB — IRON PANEL
IRON SATURATION: 4 % — ABNORMAL LOW (ref 20–55)
IRON: 12 ug/dL — ABNORMAL LOW
TOTAL IRON BINDING CAPACITY: 337 ug/dL (ref 250–425)

## 2021-12-07 LAB — BASIC METABOLIC PANEL
ANION GAP: 10 mmol/L (ref 5–14)
ANION GAP: 8 mmol/L (ref 5–14)
BLOOD UREA NITROGEN: 109 mg/dL — ABNORMAL HIGH (ref 9–23)
BLOOD UREA NITROGEN: 95 mg/dL — ABNORMAL HIGH (ref 9–23)
BUN / CREAT RATIO: 22
BUN / CREAT RATIO: 22
CALCIUM: 9.1 mg/dL (ref 8.7–10.4)
CALCIUM: 8.9 mg/dL (ref 8.7–10.4)
CHLORIDE: 90 mmol/L — ABNORMAL LOW (ref 98–107)
CHLORIDE: 89 mmol/L — ABNORMAL LOW (ref 98–107)
CO2: 32 mmol/L — ABNORMAL HIGH (ref 20.0–31.0)
CO2: 32 mmol/L — ABNORMAL HIGH (ref 20.0–31.0)
CREATININE: 4.94 mg/dL — ABNORMAL HIGH
CREATININE: 4.37 mg/dL — ABNORMAL HIGH
EGFR CKD-EPI (2021) FEMALE: 8 mL/min/{1.73_m2} — ABNORMAL LOW (ref >=60)
EGFR CKD-EPI (2021) FEMALE: 10 mL/min/{1.73_m2} — ABNORMAL LOW (ref >=60)
GLUCOSE RANDOM: 132 mg/dL (ref 70–179)
GLUCOSE RANDOM: 164 mg/dL (ref 70–179)
POTASSIUM: 4.8 mmol/L (ref 3.5–5.1)
POTASSIUM: 5 mmol/L — ABNORMAL HIGH (ref 3.4–4.8)
SODIUM: 132 mmol/L — ABNORMAL LOW (ref 135–145)
SODIUM: 129 mmol/L — ABNORMAL LOW (ref 135–145)

## 2021-12-07 LAB — CBC
HEMATOCRIT: 29.3 % — ABNORMAL LOW (ref 34.0–44.0)
HEMOGLOBIN: 8.7 g/dL — ABNORMAL LOW (ref 11.3–14.9)
MEAN CORPUSCULAR HEMOGLOBIN CONC: 29.8 g/dL — ABNORMAL LOW (ref 32.0–36.0)
MEAN CORPUSCULAR HEMOGLOBIN: 22.5 pg — ABNORMAL LOW (ref 25.9–32.4)
MEAN CORPUSCULAR VOLUME: 75.3 fL — ABNORMAL LOW (ref 77.6–95.7)
MEAN PLATELET VOLUME: 8.1 fL (ref 6.8–10.7)
PLATELET COUNT: 288 10*9/L (ref 150–450)
RED BLOOD CELL COUNT: 3.89 10*12/L — ABNORMAL LOW (ref 3.95–5.13)
RED CELL DISTRIBUTION WIDTH: 17.5 % — ABNORMAL HIGH (ref 12.2–15.2)
WBC ADJUSTED: 6.9 10*9/L (ref 3.6–11.2)

## 2021-12-07 LAB — SODIUM, URINE, RANDOM: SODIUM URINE: 44 mmol/L

## 2021-12-07 LAB — FERRITIN: FERRITIN: 12.6 ng/mL

## 2021-12-07 LAB — PHOSPHORUS: PHOSPHORUS: 5.9 mg/dL — ABNORMAL HIGH (ref 2.4–5.1)

## 2021-12-07 LAB — POTASSIUM, URINE, RANDOM: POTASSIUM URINE: 26.8 mmol/L

## 2021-12-07 LAB — MAGNESIUM
MAGNESIUM: 4.4 mg/dL — ABNORMAL HIGH (ref 1.6–2.6)
MAGNESIUM: 4.1 mg/dL — ABNORMAL HIGH (ref 1.6–2.6)

## 2021-12-07 LAB — CREATININE, URINE: CREATININE, URINE: 103.6 mg/dL

## 2021-12-07 LAB — UREA NITROGEN, URINE: UREA NITROGEN URINE: 152 mg/dL

## 2021-12-07 LAB — HIGH SENSITIVITY TROPONIN I - SINGLE
HIGH SENSITIVITY TROPONIN I: 114 ng/L (ref <=34)
HIGH SENSITIVITY TROPONIN I: 122 ng/L (ref <=34)

## 2021-12-07 MED ADMIN — furosemide (LASIX) 120 mg in sodium chloride (NS) 0.9 % 50 mL IVPB: 120 mg | INTRAVENOUS | @ 18:00:00 | Stop: 2021-12-07

## 2021-12-07 MED ADMIN — metOLazone (ZAROXOLYN) tablet 5 mg: 5 mg | ORAL | @ 06:00:00 | Stop: 2021-12-07

## 2021-12-07 MED ADMIN — oxyCODONE (OxyCONTIN) 12 hr crush resistant ER/CR tablet 10 mg: 10 mg | ORAL | @ 06:00:00 | Stop: 2021-12-20

## 2021-12-07 MED ADMIN — oxyCODONE (OxyCONTIN) 12 hr crush resistant ER/CR tablet 10 mg: 10 mg | ORAL | @ 16:00:00 | Stop: 2021-12-20

## 2021-12-07 MED ADMIN — furosemide (LASIX) injection 80 mg: 80 mg | INTRAVENOUS | @ 06:00:00 | Stop: 2021-12-07

## 2021-12-07 MED ADMIN — pantoprazole (PROTONIX) EC tablet 20 mg: 20 mg | ORAL | @ 14:00:00

## 2021-12-07 MED ADMIN — amiodarone (PACERONE) tablet 100 mg: 100 mg | ORAL | @ 14:00:00

## 2021-12-07 MED ADMIN — montelukast (SINGULAIR) tablet 10 mg: 10 mg | ORAL | @ 14:00:00

## 2021-12-07 MED ADMIN — pravastatin (PRAVACHOL) tablet 10 mg: 10 mg | ORAL | @ 14:00:00

## 2021-12-07 MED ADMIN — Tafamidis 61 mg capsule - **PATIENT SUPPLIED MEDICATION**: 61 mg | ORAL | @ 23:00:00

## 2021-12-07 MED ADMIN — spironolactone (ALDACTONE) tablet 25 mg: 25 mg | ORAL | @ 19:00:00

## 2021-12-07 MED ADMIN — apixaban (ELIQUIS) tablet 5 mg: 5 mg | ORAL | @ 14:00:00

## 2021-12-07 MED ADMIN — apixaban (ELIQUIS) tablet 5 mg: 5 mg | ORAL | @ 06:00:00

## 2021-12-07 NOTE — Unmapped (Signed)
Cardiology - Team 1 [facility]) Progress Note    Assessment & Plan:   Barbara Huber is an 80 y/o F w/ a PMHx of [name] (iso cardiac amyloidosis), pAF (s/p DCCV 12/2020 w/ conversion to NSR, on Eliquis), [name] IV, T2DM (A1c 7.0 11/2021), IDA, COPD (on home O2 3.5L), and pHTN who presents for management of fatigue, new junctional rhythm, worsening AKI, troponin elevation, and possible volume overload.    Principal Problem:    AKI (acute kidney injury) (CMS-HCC)  Active Problems:    Spinal stenosis of lumbar region    COPD (chronic obstructive pulmonary disease) (CMS-HCC)    Sleep apnea in adult    Essential hypertension    Type 2 diabetes mellitus with stage 3b chronic kidney disease, without long-term current use of insulin (CMS-HCC)    Anemia in chronic kidney disease    Chronic kidney disease, stage III (moderate) (CMS-HCC)    Pulmonary hypertension (CMS-HCC)    Chronic prescription opiate use    Cardiac amyloidosis (CMS-HCC)  Resolved Problems:    * No resolved hospital problems. *    [name] (LVEF 55%, GIIDD) -- Cardiac Amyloidosis:   She's had issues with hypotension on ARB and BB, as well as atrial arrhythmias. SPEP/UPEP/free light chains without concern. PYP NM SPECT + ??for ATTR amyloid, grade 3 uptake. 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 Recently hospitalized and discharged on 12/6 for [name] exacerbation managed with [name] and metolazone. Today BNP is elevated to 854, which is elevated from prior.  On 11/29/2021 BNP was 622, on 11/14/2021, BNP was 301.  Last echo on 03/2021 showed LVEF greater than 55% and grade 2 diastolic dysfunction.  Dry weight is 214 pounds.  Interestingly, as of 12/03/2021, patient was at her dry weight of 213 pounds.  Despite lack of overt volume overload signs and symptoms, given elevated BNP, subjective fatigue, and worsening AKI, feel that patient does need continued diuresis. Elevate troponin trended down. Variabilities in troponin is expected with amyloidosis. Given that patient did not have any ACS symptoms, will stop trending.   -Will restart spironolactone given that K normalized  -Holding torsemide and metolazone given that patient has not responded to her outpatient management   -S/p 80 IV lasix and metolazone 5   -Was rebolused with Lasix 120 IV given that she did not make a lot of urine on 80 IV  -Continued Tafamidis 61 mg daily   -Strict I/Os   -Daily weight     Paroxysmal AF -- Atrial Fibrillation -- New Junctional Rhythm:  Paroxysmal atrial fibrillation, most recently has remained in normal sinus rhythm but as of this hospitalization is in a junctional rhythm.  On amiodarone and apixaban at home. CHA2DS2-VASc score = 5 (1-gender, 2-Age, 1-DM, 1-HTN).  She did have a cardioversion on 01/10/2021 and was in normal sinus rhythm afterwards. EKG showed sinus rhythm with 1st degree AV block and low voltage Q waves.   -Continue Eliquis 5 mg BID   -Amiodarone reduced dose 100 mg daily 2/2 to bothersome tremor as a side effect     AKI on [name] IV:  GFR 10 on admission, acutely worsened.  Creatinine 4.60, up from 2.5 on 12/02/2021.  BUN elevated to 87, up from 67.  BUN/creatinine ratio is 20, suggesting possible prerenal/cardiorenal etiology, although because of acute rise given stabilization with discharge from hospital 4 days ago is unclear.  Patient is producing urine, although decreased from prior.  Has a pure wick in place. Renal  US unremarkable. Calculate Fena at 1.6%, saying intrinsic damage, but most likely it is pre-renal AKi on top of ongoing [name].   - Avoid nephrotoxic agents  - Trend BMP, Phos  - Holding gabapentin given the AKI     T2DM (HbA1c 7.0%):  Patient is reporting increased blood sugar readings at home - was previously on metformin in 2021 but this was discontinued given normal A1c.  Hemoglobin A1c is newly elevated to 7.0 from 5.7 previously, last 05/2020 in the system.  - SSI  - Restart metformin on discharge  - Continue Statin      Chronic Microcytic Anemia -- Iron Deficiency Anemia:  Hemoglobin at baseline of around 8.8, likely related to [name] 4, AoCD, and iron deficiency anemia.  Has received IV iron in the past. Iron at 12 and saturation at less than 4%  - Trend CBC    Group 2 pHTN:  Followed by Dr. [name], [facility] pulmonology, thought to be secondary to either [name] or COPD.  - ctm    COPD -- Chronic Hypoxemic Respiratory Failure:   On 3L at home at baseline. No evidence of exacerbation at this time.  Arrived to floor on 4 L nasal cannula.    - Home nebs  - Albuterol as needed  - IS    Daily Checklist:  Diet: Regular Diet  DVT PPx: Full anticoagulation   Electrolytes: Replete Potassium to >/=4 and Magnesium to >/=2  Code Status: Full Code  Dispo: TBD    Team Contact Information:   Primary Team: Cardiology - Team 1 ([facility])  Primary Resident: Yehuda Savannah, MD  Resident's Pager: [phone] (Cardiology Team 1 Intern)    Interval History:   No acute events overnight.  Patient is resting in bed. On 5 L of oxygen, satting 100%. She does not complain of shortness of breath at rest, only with exertion. No chest pain. Does not make normal amount of urine and is not responding to diuretics, will try higher bolus dose today.   All other systems were reviewed and are negative except as noted in the HPI    Objective:     Temp:  [36.2 ??C (97.1 ??F)-37.4 ??C (99.3 ??F)] 36.9 ??C (98.4 ??F)  Heart Rate:  [52-63] 57  SpO2 Pulse:  [53-60] 53  Resp:  [16-22] 18  BP: (92-156)/(47-67) 137/47  SpO2:  [93 %-99 %] 99 %,   Intake/Output Summary (Last 24 hours) at 12/07/2021 1238  Last data filed at 12/07/2021 0000  Gross per 24 hour   Intake 250 ml   Output 275 ml   Net -25 ml   , No intake/output data recorded.,  , O2 Device: Nasal cannula  O2 Flow Rate (L/min):  [4 L/min-5 L/min] 5 L/min, There is no height or weight on file to calculate BMI.,   Wt Readings from Last 3 Encounters:   12/03/21 96.6 kg (213 lb)   12/02/21 100.3 kg (221 lb 1.9 oz)   11/17/21 100.2 kg (221 lb) Gen: in NAD, answers questions appropriately  Eyes: sclera anicteric, EOMI  HENT: atraumatic, MMM, OP w/o erythema or exudate   Heart: RRR, S1, S2, no M/R/G, no chest wall tenderness, JVD present   Lungs: CTAB, no crackles or wheezes, no use of accessory muscles  Abdomen: Normoactive bowel sounds, soft, NTND, no rebound/guarding  Extremities: no clubbing, cyanosis, or edema in the BLEs  Psych: Alert, oriented, appropriate mood and affect    Labs/Studies: Labs and Studies from the last 24hrs per EMR and  Reviewed    Yehuda Savannah, MD  PGY1, Internal Medicine

## 2021-12-07 NOTE — Unmapped (Signed)
Pt transfer from [facility]. A&Ox4. on 3-4 liters of 02 titrated up to 5L with exertion. HR in 50s-60s. Junctional rhythm. Other VSS stable. Pt had decreased appetite. Fluids and food encouraged.  No reports of pain overnight. Daughter at bedside. No acute changes overnight.       Problem: Adult Inpatient Plan of Care  Goal: Absence of Hospital-Acquired Illness or Injury  Outcome: Ongoing - Unchanged  Intervention: Identify and Manage Fall Risk  Recent Flowsheet Documentation  Taken 12/06/2021 2124 by Stephens November, RN  Safety Interventions: fall reduction program maintained  Intervention: Prevent Skin Injury  Recent Flowsheet Documentation  Taken 12/06/2021 2124 by Stephens November, RN  Skin Protection: adhesive use limited     Problem: Adult Inpatient Plan of Care  Goal: Absence of Hospital-Acquired Illness or Injury  Intervention: Prevent Skin Injury  Recent Flowsheet Documentation  Taken 12/06/2021 2124 by Stephens November, RN  Skin Protection: adhesive use limited

## 2021-12-07 NOTE — Unmapped (Signed)
Paged by ED at [facility] regarding EKG.    EKGs show a junctional rhythm in a patient with amyloid. BP okay. She has an AKI and hyperkalemia in the setting of potential volume overload.    Based on her rhythm and labs I'd feel more comfortable with her being at [facility] for closer monitoring. ED physician to reach out to [facility].    No urgent indication for pacemaker at this time.    Caroline Sauger, MD  [facility] Cardiology Fellow, PGY-6

## 2021-12-07 NOTE — Unmapped (Addendum)
Before Discharge  [ ]  make sure there is a plan for palliative care follow-up ([place] or Juleen Starr), [facility] palliative care # [phone]    Barbara Huber is an 80 y/o F w/ a PMHx of [name] (iso cardiac amyloidosis), pAF (s/p DCCV 12/2020 w/ conversion to NSR, on Eliquis), [name] IV, T2DM (A1c 7.0 11/2021), IDA, COPD (on home O2 3.5L), and pHTN who presents for management of fatigue, new junctional rhythm, worsening AKI, troponin elevation, and possible volume overload.    [name] (LVEF 55%, GIIDD) -- Cardiac Amyloidosis:   She's had issues with hypotension on ARB and BB, as well as atrial arrhythmias. SPEP/UPEP/free light chains without concern. 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 Recently hospitalized and discharged on 12/6 for [name] exacerbation managed with [name] and metolazone. Today BNP is elevated to 854, which is elevated from prior.  On 11/29/2021 BNP was 622, on 11/14/2021, BNP was 301.  Last echo on 03/2021 showed LVEF greater than 55% and grade 2 diastolic dysfunction.  Dry weight is 214 pounds.  Interestingly, as of 12/03/2021, patient was at her dry weight of 213 pounds.  Despite lack of overt volume overload signs and symptoms, given elevated BNP, subjective fatigue, and worsening AKI, feel that patient does need continued diuresis. Elevate troponin trended down. Variabilities in troponin is expected with amyloidosis. Given that patient did not have any ACS symptoms, will stop trending. Continued spironolactone. gave one dose of lasix and metolazone, but did not respond. Redosed with 120 IV lasix. Continued tafamidis daily. Reached goal of net negative 1 L. After that, put her on standing dose of Lasix 120 BID, in addition to metolazone 10 daily. TTE showed LVEF of >70%. LV with severely increased wall thickness, G3DD, RV with normal systolic function, elevated Right atrial pressure. Before discharge she was switched to PO diuresis and given the increasing Cr levels, she was off diuresis and is not discharged on them until Creatinine goes in a right direction. She will have follow up with PCP and diuresis clinic next week.      Paroxysmal AF -- Atrial Fibrillation -- New Junctional Rhythm:  Paroxysmal atrial fibrillation, most recently has remained in normal sinus rhythm but as of this hospitalization is in a junctional rhythm.  On amiodarone and apixaban at home. CHA2DS2-VASc score = 5 (1-gender, 2-Age, 1-DM, 1-HTN).  She did have a cardioversion on 01/10/2021 and was in normal sinus rhythm afterwards. EKG showed sinus rhythm with 1st degree AV block and low voltage Q waves.  Continue Eliquis 5 mg BID.        AKI on [name] IV:  GFR 10 on admission, acutely worsened.  Creatinine 4.60, up from 2.5 on 12/02/2021.  BUN elevated to 87, up from 67.  BUN/creatinine ratio is 20, suggesting possible prerenal/cardiorenal etiology, although because of acute rise given stabilization with discharge from hospital 4 days ago is unclear.  Patient is producing urine, although decreased from prior.  Has a pure wick in place. Renal US unremarkable. Calculate Fena at 1.6%, saying intrinsic damage, but most likely it is pre-renal AKi on top of ongoing [name].   - Avoid nephrotoxic agents  - Trend BMP, Phos  - Holding gabapentin given the AKI     T2DM (HbA1c 7.0%):  Patient is reporting increased blood sugar readings at home - was previously on metformin in 2021 but this was discontinued given normal A1c.  Hemoglobin  A1c is newly elevated to 7.0 from 5.7 previously, last 05/2020 in the system.  - SSI  - Restart metformin on discharge  - Continue Statin     Chronic Microcytic Anemia -- Iron Deficiency Anemia:  Hemoglobin at baseline of around 8.8, likely related to [name] 4, AoCD, and iron deficiency anemia.  Has received IV iron in the past. Iron at 12 and saturation at less than 4%  - Trend CBC     Group 2 pHTN:  Followed by Dr. [name], [facility] pulmonology, thought to be secondary to either [name] or COPD.  - ctm     COPD -- Chronic Hypoxemic Respiratory Failure:   On 3L at home at baseline. No evidence of exacerbation at this time.  Arrived to floor on 4 L nasal cannula.    - Home nebs  - Albuterol as needed  - IS    Hematochizia -- Chronic Microcytic Anemia -- Iron Deficiency Anemia:  Two bloody bowel movements noted night of 12/12. Hemoglobin 12/13 down to 7.3 from baseline of around 8.8. Patient received IV iron 12/13, hemoglobin 12/15 up to 8.5. Blood per rectum thought most likely to be the result of increased dosing of Eliquis (5 mg up from home dose of 2.5 mg BID). Chronically low hemoglobin likely related to [name] 4, AoCD, and iron deficiency anemia. Has received IV iron in the past. Iron at 12 and saturation at less than 4%. Has not had another blood stool as of 12/17. Hg Stable at 9.1.  Continue holding Eliquis and defer to outpatient setting.

## 2021-12-07 NOTE — Unmapped (Signed)
Cardiology - Team 1 [facility]) History & Physical    Assessment & Plan:   Barbara Huber is an 80 y/o F w/ a PMHx of [name] (iso cardiac amyloidosis), pAF (s/p DCCV 12/2020 w/ conversion to NSR, on Eliquis), [name] IV, T2DM (A1c 7.0 11/2021), IDA, COPD (on home O2 3.5L), and pHTN who presents for management of fatigue, new junctional rhythm, worsening AKI, troponin elevation, and possible volume overload.    Principal Problem:    AKI (acute kidney injury) (CMS-HCC)  Active Problems:    Spinal stenosis of lumbar region    COPD (chronic obstructive pulmonary disease) (CMS-HCC)    Sleep apnea in adult    Essential hypertension    Type 2 diabetes mellitus with stage 3b chronic kidney disease, without long-term current use of insulin (CMS-HCC)    Anemia in chronic kidney disease    Chronic kidney disease, stage III (moderate) (CMS-HCC)    Pulmonary hypertension (CMS-HCC)    Chronic prescription opiate use    Cardiac amyloidosis (CMS-HCC)  Resolved Problems:    * No resolved hospital problems. *    [name] (LVEF 55%, GIIDD) -- Cardiac Amyloidosis:   She's had issues with hypotension on ARB and BB, as well as atrial arrhythmias. SPEP/UPEP/free light chains without concern. 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 Recently hospitalized and discharged on 12/6 for [name] exacerbation managed with [name] and metolazone. Today BNP is elevated to 854, which is elevated from prior.  On 11/29/2021 BNP was 622, on 11/14/2021, BNP was 301.  Last echo on 03/2021 showed LVEF greater than 55% and grade 2 diastolic dysfunction.  Dry weight is 214 pounds.  Interestingly, as of 12/03/2021, patient was at her dry weight of 213 pounds.  Despite lack of overt volume overload signs and symptoms, given elevated BNP, subjective fatigue, and worsening AKI, feel that patient does need continued diuresis.  - holding Spironolactone 25 daily given hyperK  - Home diuretic regimen is torsemide 40 mg every morning and 20 every afternoon, along with as needed metolazone  - 80 mg IV Lasix x1 overnight + 5 mg metolazone x 1  - Diuresis per primary team  - Continue home Tafamidis 61 mg daily (non-formulary, pt supplied)  - f/u repeat TTE  - strict I/o, daily wt    Elevated Troponin:    Troponin chronically elevated, but slightly more so than usual today up to 123.  Favor type II MI in the setting of possible volume overload.  EKG does have a change from her usual normal sinus rhythm to now a junctional rhythm - cannot rule out ACS at this time, but low suspicion  - AM EKG  - Trend troponin to peak  - Status post ASA load 12/07/2019 management  - tele  - K>4, Mg>2  -A1c, TSH, lipid panel    Paroxysmal AF -- Atrial Fibrillation -- New Junctional Rhythm:  Paroxysmal atrial fibrillation, most recently has remained in normal sinus rhythm but as of this hospitalization is in a junctional rhythm.  On amiodarone and apixaban at home. CHA2DS2-VASc score = 5 (1-gender, 2-Age, 1-DM, 1-HTN).  She did have a cardioversion on 01/10/2021 and was in normal sinus rhythm afterwards.  -Continue Eliquis 5 mg twice daily        -Transition to heparin gtt if concern for ACS event pending troponin trend  -Amiodarone reduced dose 100 mg daily secondary to bothersome tremor/jerking, discussed below  -  AM EKG    ?UTI:   Urinalysis with moderate LE, urine culture pending.  No fever or leukocytosis at this time, although have low threshold to start antibiotics given fatigue which led to admission.  -Follow-up urine culture  -Did not start antibiotics overnight    AKI on [name] IV:  GFR 10 on admission, acutely worsened.  Creatinine 4.60, up from 2.5 on 12/02/2021.  BUN elevated to 87, up from 67.  BUN/creatinine ratio is 20, suggesting possible prerenal/cardiorenal etiology, although because of acute rise given stabilization with discharge from hospital 4 days ago is unclear.  Patient is producing urine, although decreased from prior.  Has a pure wick in place.  - Avoid nephrotoxic agents  - Trend BMP, Phos  - renal ultrasound  - Urine studies  - Diuresis as indicated    T2DM (HbA1c 7.0%):  Patient is reporting increased blood sugar readings at home - was previously on metformin in 2021 but this was discontinued given normal A1c.  Hemoglobin A1c is newly elevated to 7.0 from 5.7 previously, last 05/2020 in the system.  - SSI  - f/u A1c  -May require metformin on discharge  - Holding gabapentin given AKI  - Statin    Chronic Microcytic Anemia -- Iron Deficiency Anemia:  Hemoglobin at baseline of around 8.8, likely related to [name] 4, AoCD, and iron deficiency anemia.  Has received IV iron in the past.  - Trend CBC  - Follow-up iron studies    Hyperkalemia -- Elevated Magnesium   On arrival to ED, potassium elevated to 5.6, up from 4.0 previously.  Status post shifting and calcium gluconate in the ED.  No peaked T waves.  Likely related to new AKI.  Magnesium is also elevated.  - Trend BMP/Mg  - holding spironolactone    Myoclonic Jerking:  At most recent hospitalization, patient was noted to have myoclonic jerking which was evaluated by neurology and felt to be secondary to toxic metabolic etiology, with uremia.  At that hospitalization, myoclonic jerking improved with volume optimization.  Of note, the myoclonic jerks were very distressing to the patient, making it difficult for her to achieve any of her ADLs.  A trial of decreasing amiodarone did not improve symptoms.  - CTM  - Manage [name]  - Was scheduled for outpatient neurology follow-up  - hypercarbia present, CPAP qHs    COPD -- Chronic Hypoxemic Respiratory Failure:   On 3L at home at baseline. No evidence of exacerbation at this time.  Arrived to floor on 4 L nasal cannula.    - Home nebs  - Albuterol as needed  - IS    Group 2 pHTN:  Followed by Dr. [name], [facility] pulmonology, thought to be secondary to either [name] or COPD.  - ctm    Chronic Pain:   -Home OxyContin    Daily Checklist:  Diet: Regular Diet  DVT PPx: Patient Already on Full Anticoagulation with eliquis  Electrolytes: Replete Potassium to >/=4 and Magnesium to >/=2  Code Status: Full Code    Chief Concern:   AKI (acute kidney injury) (CMS-HCC)    Subjective:   HPI:  Barbara Huber is an 80 y/o F w/ a PMHx of [name] (iso cardiac amyloidosis), pAF (s/p DCCV 12/2020 w/ conversion to NSR, on Eliquis), [name] IV, T2DM (A1c 7.0 11/2021), IDA, COPD (on home O2 3.5L), and pHTN who presents for management of fatigue, new junctional rhythm, worsening AKI, troponin elevation, and possible volume overload.    HPI:  Patient was recently discharged on 12/02/2021 for a [name] exacerbation, managed with [name].  She presented to the ED 12/06/2021 for feeling unwell with a decreased appetite and generalized fatigue, as well as hyperglycemia at home.  No signs or symptoms of acute heart failure exacerbation.     Thus far today in the ED patient has been afebrile and hemodynamically stable.  Saturating 95% on 4 L nasal cannula.  Anemic to hemoglobin 8.8, hyperkalemic to 5.6, hypochloremic to 89, AKI with creatinine 4.6 up from 2.47 12/02/2021, BUN 87.  Troponin elevated to 123.  BNP elevated to 853.  Urinalysis with moderate LE, urine culture pending.  EKG with junctional rhythm.  Chest x-ray with mildly increased pulmonary edema.    She has been loaded with 324 aspirin x1, shifted for hyperkalemia with 5 units regular insulin, and she has received calcium gluconate.     - Tobacco/Alcohol/Drugs: None  - Code Status: Full  - HCDM: Daughter      Allergies:  Nitrofurantoin and Lipitor [atorvastatin]    Medications:   Prior to Admission medications    Medication Dose, Route, Frequency   ACCU-CHEK AVIVA PLUS TEST STRP Strp UESE TO CHECK BLOOD SUGAR 3 TIMES A DAY BEFORE MEALS   acetaminophen (TYLENOL) 500 MG tablet 1,000 mg, Oral, Daily PRN   ADVAIR HFA 115-21 mcg/actuation inhaler INHALE TWO PUFFS BY MOUTH TWICE A DAY   albuterol HFA 90 mcg/actuation inhaler 2 puffs, Inhalation, Every 8 hours PRN   amiodarone (PACERONE) 200 MG tablet 100 mg, Oral, Daily (standard)  Patient taking differently: Take 200 mg by mouth daily.   apixaban (ELIQUIS) 5 mg Tab 2.5 mg, Oral, 2 times a day (standard)   cranberry 500 mg cap 500 mg, Oral, Daily   estradioL (ESTRACE) 0.01 % (0.1 mg/gram) vaginal cream INSERT ONE GRAM VAGINALLY AT BEDTIME   fosfomycin (MONUROL) 3 gram Pack 3 g, Oral, Weekly   gabapentin (NEURONTIN) 300 MG capsule 300 mg, Oral, At bedtime   lancets Misc 1 each, Miscellaneous, Daily (standard), Accu Check   metOLazone (ZAROXOLYN) 5 MG tablet 5 mg, Oral, Daily PRN   montelukast (SINGULAIR) 10 mg tablet 10 mg, Oral, Daily   NARCAN 4 mg/actuation nasal spray 1 spray, Alternating Nares, Once as needed, PRN - Emergency use.   NON FORMULARY APPLY FROM NECK DOWN TWICE A DAY   oxyCODONE (OXYCONTIN) 10 mg TR12 12 hr crush resistant ER/CR tablet 10 mg, Oral, Every 12 hours   OXYGEN-AIR DELIVERY SYSTEMS MISC 5 L, Miscellaneous, Currently using 3  L/min via [place]   pantoprazole (PROTONIX) 20 MG tablet 20 mg, Oral, Daily (standard)   rosuvastatin (CRESTOR) 5 MG tablet 5 mg, Oral, Every other day   SPIRIVA RESPIMAT 2.5 mcg/actuation inhalation mist INHALE TWO PUFFS BY MOUTH ONCE DAILY   spironolactone (ALDACTONE) 25 MG tablet 25 mg, Oral, Daily (standard)   tafamidis 61 mg cap Take 1 capsule (61 mg) by mouth daily.   torsemide (DEMADEX) 20 MG tablet Take 2 tablets (40 mg total) by mouth every morning AND 1 tablet (20 mg total) daily after lunch. May also take 1 tablet (20 mg total) daily as needed (weight gain).   varicella-zoster [id number], PF, (SHINGRIX, PF,) 50 mcg/0.5 mL SusR injection 0.5 mL, Intramuscular       Medical History:  Past Medical History:   Diagnosis Date   ??? Acute kidney injury superimposed on chronic kidney disease (CMS-HCC) 10/11/2020   ??? Acute on chronic diastolic (congestive) heart failure (CMS-HCC) 08/23/2020   ???  Arthritis    ??? Calculus of kidney    ??? Calculus of ureter    ??? CHF (congestive heart failure) (CMS-HCC)    ??? Chronic atrial fibrillation (CMS-HCC) 07/20/2019   ??? COPD (chronic obstructive pulmonary disease) (CMS-HCC)    ??? Diabetes (CMS-HCC)    ??? Gangrenous cholecystitis 10/11/2020   ??? GERD (gastroesophageal reflux disease)    ??? Hydronephrosis    ??? Hypertension    ??? Hyponatremia 10/11/2020   ??? Intermediate coronary syndrome (CMS-HCC) 03/13/2014   ??? Lower extremity edema 09/28/2020   ??? Lumbar stenosis    ??? Microscopic hematuria    ??? Nausea alone    ??? Nephrolithiasis 04/17/2016   ??? Neuropathy    ??? Nocturia    ??? Other chronic cystitis    ??? Pulmonary hypertension (CMS-HCC)    ??? Renal colic    ??? Sleep apnea    ??? Unstable angina pectoris (CMS-HCC) 03/13/2014       Surgical History:  Past Surgical History:   Procedure Laterality Date   ??? BACK SURGERY  1995   ??? CARPAL TUNNEL RELEASE Left 2014   ??? HYSTERECTOMY  1971   ??? IR INSERT CHOLECYSTOSMY TUBE PERCUTANEOUS  10/02/2020    IR INSERT CHOLECYSTOSMY TUBE PERCUTANEOUS 10/02/2020 Braulio Conte, MD IMG VIR [facility]   ??? LUMBAR DISC SURGERY     ??? PR REMOVAL GALLBLADDER N/A 10/06/2020    Procedure: CHOLECYSTECTOMY;  Surgeon: Katherina Mires, MD;  Location: MAIN OR [facility];  Service: Trauma   ??? PR RIGHT HEART CATH O2 SATURATION & CARDIAC OUTPUT N/A 09/30/2020    Procedure: Right Heart Catheterization;  Surgeon: Neal Dy, MD;  Location: [facility] CATH;  Service: Cardiology   ??? PR RIGHT HEART CATH O2 SATURATION & CARDIAC OUTPUT N/A 01/09/2021    Procedure: Right Heart Catheterization;  Surgeon: Lesle Reek, MD;  Location: [facility] CATH;  Service: Cardiology       Family History:   Family History   Problem Relation Age of Onset   ??? Hypertension Mother    ??? Cancer Father         COLON CANCER   ??? Anesthesia problems Neg Hx    ??? Broken bones Neg Hx    ??? Clotting disorder Neg Hx    ??? Collagen disease Neg Hx    ??? Diabetes Neg Hx    ??? Dislocations Neg Hx    ??? Fibromyalgia Neg Hx    ??? Gout Neg Hx    ??? Hemophilia Neg Hx    ??? Osteoporosis Neg Hx    ??? Rheumatologic disease Neg Hx    ??? Scoliosis Neg Hx    ??? Severe sprains Neg Hx    ??? Sickle cell anemia Neg Hx    ??? Spinal Compression Fracture Neg Hx    ??? GU problems Neg Hx    ??? Kidney cancer Neg Hx    ??? Prostate cancer Neg Hx        Social History:  The patient lives with family    Social History     Tobacco Use   ??? Smoking status: Former     Packs/day: 0.33     Types: Cigarettes     Quit date: 2019     Years since quitting: 3.9   ??? Smokeless tobacco: Never   ??? Tobacco comments:     Quit a few years ago   Vaping Use   ??? Vaping Use: Never used   Substance Use  Topics   ??? Alcohol use: No   ??? Drug use: No        Review of Systems:  10 systems were reviewed and are negative unless otherwise mentioned in the HPI    Objective:   Physical Exam:  Temp:  [36.9 ??C (98.5 ??F)-37.4 ??C (99.3 ??F)] 36.9 ??C (98.5 ??F)  Heart Rate:  [53-63] 53  SpO2 Pulse:  [53-60] 53  Resp:  [16-22] 18  BP: (92-121)/(48-67) 121/48  SpO2:  [93 %-97 %] 95 %    General: Alert and oriented, in no distress.  Skin: Skin is warm and dry.  HEENT: Normocephalic and atraumatic, moist mucous membranes, no nasal drainage, no intra-oral lesions or erythema.  Lungs: Normal respiratory effort on supplemental O2. Crackles bilaterally.  Heart: Regular rhythm, normal heart sounds.  Abdomen: Soft and non-tender to palpation.  Mildly distended.  Genitourinary/Rectal: Deferred  Musculoskeletal: trace BLE edema, no deformities or tenderness.  Lymphatic: No cervical or supraclavicular lymphadenopathy noted.  Neurological: Normal speech and language. No gross focal neurologic deficits are appreciated.  Psychiatric: Normal affect and behavior for situation      Labs/Studies/Imaging:  Labs, Studies, Imaging from the last 24hrs per EMR and personally reviewed

## 2021-12-07 NOTE — Unmapped (Signed)
Emergency Medicine Access Physician [facility])  [facility] Patient [facility]istics [facility] - Transfer Request Note    Requesting Provider: Allene Dillon, MD    Requesting Hospital: [facility]    Requesting Service: Emergency Medicine        Reason for transfer request: 3rd degree heart block    Overview of ED course at transferring hospital: Barbara Huber is a 80 y.o. female with history of CHF (recently discharged from [facility] on 12/6) [name], dm, pulm htn, cardiac amyloid. She presented with weakness to [place] today and is in 3rd degree AVB. HR 50s. BP 100s/50s. She was to come to to the ED due to a lack of cardiology beds.      I received a call from [place]ry (MAO) at 6:52 PM.  A bed is coming open in step down and she will be directed to this bed, in lieu of coming to the [facility] ED. MAO will contact cardiology regarding this situation.     Was the patient accepted to the [facility] ED in transfer: No.  If no, why?: Patient redirected to inpatient bed    Accepting service/expected service information:  Has an inpatient or consulting service been contacted by the [facility], [facility] or the referring provider?: No.  If admitting/consult service contacted by [facility]:  Time of discussion: Documented above in overview section  Name/service (e.g. Dr. Floyde Parkins, neprology) of inpatient team member/consultant: Documented above in overview section  Role of admit/consult service member: Documented above in overview section  Brief summary of call: Documented above in overview section  Anticipated Inpatient Bed Type Needed: [facility] Imaging:  Was Imaging Done at the Referring Hospital:  Yes.  If yes, what studies?: cxr  [employer]:  [employer], images available in [employer]

## 2021-12-07 NOTE — Unmapped (Signed)
PHYSICAL THERAPY  Evaluation (12/07/21 1405)          Patient Name:?? Barbara Huber????????   Medical Record Number: [id number]   Date of Birth: 10/21/1941  Sex: Female??  ??    Treatment Diagnosis: Decreased endurance, decreased mobility     Activity Tolerance: Tolerated treatment well     ASSESSMENT  Problem List: Decreased endurance, Decreased mobility, Fall risk, Gait deviation, Impaired balance      Assessment : [name] is an 80 y/o F w/ a PMHx of [name] (iso cardiac amyloidosis), pAF (s/p DCCV 12/2020 w/ conversion to NSR, on Eliquis), [name] IV, T2DM (A1c 7.0 11/2021), IDA, COPD (on home O2 3.5L), and pHTN who presents for management of fatigue, new junctional rhythm, worsening AKI, troponin elevation, and possible volume overload. Patient presents to PT with impairments of general deconditioning, impaired mobility; impacting functional mobility and independence. Patient is very motivated/receptive to therapy. Fair tolerance to static OOB activity, desaturation noted with mobility but quick recovery with oxygen titration and exercise cessation. patient subjectively asymptomatic throughout. patient likely nearing her recent functional baseline, deconditioning below baseline however. Currently recommend 3x post acute discharge at this time. Based on evaluation findings associated with the patient's personal history factors, examination of body systems, and clinical presentation, the patient classifies as a moderate complexity level evaluation.      Today's Interventions: PT EVAL. Education role of PT, POC, benefits of mobilization, up with assistance, progressive mobility. Graded activity to facilitate early mobilization, independence/activity tolerance. self pacing/energy conservation strategies reviewed. vitals monitored.                            PLAN  Planned Frequency of Treatment:?? 1-2x per day for: 2-3x week       Planned Interventions: Balance activities, Education - Patient, Endurance activities, Functional mobility, Therapeutic exercise, Therapeutic activity, Self-care / Home training, Transfer training, Education - Family / caregiver, Gait training, Home exercise program, Neuromuscular re-education     Post-Discharge Physical Therapy Recommendations:?? 3x weekly     PT DME Recommendations: None (has rollator at home)??????????       Goals:   Patient and Family Goals: get stronger/go home     Long Term Goal #1: patient will improve AMPAC to 24/24 in 4 weeks        SHORT GOAL #1: patient will be able to ambualte >24ft with mod independence, LRAD  ?????????????????????? Time Frame : 2 weeks     ??????????????????????       ??????????????????????       ??????????????????????       ??????????????????????       Prognosis:?? Good  Positive Indicators: Family support, PLOF  Barriers to Discharge: Endurance deficits     SUBJECTIVE  Patient reports: patient agreeable to PT; I like to get up  Current Functional Status: end of session patient supine, HOB elevated. needs in reach. family present.     Prior Functional Status: patient reports she ambulates at home with mod independence using rollator. denies fall history. utilizes 3L via [place].  Equipment available at home: Rollator, Shower chair with back (transport chair)      Past Medical History:   Diagnosis Date   ??? Acute kidney injury superimposed on chronic kidney disease (CMS-HCC) 10/11/2020   ??? Acute on chronic diastolic (congestive) heart failure (CMS-HCC) 08/23/2020   ??? Arthritis    ??? Calculus of kidney    ??? Calculus of ureter    ???  CHF (congestive heart failure) (CMS-HCC)    ??? Chronic atrial fibrillation (CMS-HCC) 07/20/2019   ??? COPD (chronic obstructive pulmonary disease) (CMS-HCC)    ??? Diabetes (CMS-HCC)    ??? Gangrenous cholecystitis 10/11/2020   ??? GERD (gastroesophageal reflux disease)    ??? Hydronephrosis    ??? Hypertension    ??? Hyponatremia 10/11/2020   ??? Intermediate coronary syndrome (CMS-HCC) 03/13/2014   ??? Lower extremity edema 09/28/2020   ??? Lumbar stenosis    ??? Microscopic hematuria    ??? Nausea alone    ??? Nephrolithiasis 04/17/2016   ??? Neuropathy    ??? Nocturia    ??? Other chronic cystitis    ??? Pulmonary hypertension (CMS-HCC)    ??? Renal colic    ??? Sleep apnea    ??? Unstable angina pectoris (CMS-HCC) 03/13/2014            Social History     Tobacco Use   ??? Smoking status: Former     Packs/day: 0.33     Types: Cigarettes     Quit date: 2019     Years since quitting: 3.9   ??? Smokeless tobacco: Never   ??? Tobacco comments:     Quit a few years ago   Substance Use Topics   ??? Alcohol use: No       Past Surgical History:   Procedure Laterality Date   ??? BACK SURGERY  1995   ??? CARPAL TUNNEL RELEASE Left 2014   ??? HYSTERECTOMY  1971   ??? IR INSERT CHOLECYSTOSMY TUBE PERCUTANEOUS  10/02/2020    IR INSERT CHOLECYSTOSMY TUBE PERCUTANEOUS 10/02/2020 Braulio Conte, MD IMG VIR [facility]   ??? LUMBAR DISC SURGERY     ??? PR REMOVAL GALLBLADDER N/A 10/06/2020    Procedure: CHOLECYSTECTOMY;  Surgeon: Katherina Mires, MD;  Location: MAIN OR [facility];  Service: Trauma   ??? PR RIGHT HEART CATH O2 SATURATION & CARDIAC OUTPUT N/A 09/30/2020    Procedure: Right Heart Catheterization;  Surgeon: Neal Dy, MD;  Location: [facility] CATH;  Service: Cardiology   ??? PR RIGHT HEART CATH O2 SATURATION & CARDIAC OUTPUT N/A 01/09/2021    Procedure: Right Heart Catheterization;  Surgeon: Lesle Reek, MD;  Location: [facility] CATH;  Service: Cardiology             Family History   Problem Relation Age of Onset   ??? Hypertension Mother    ??? Cancer Father         COLON CANCER   ??? Anesthesia problems Neg Hx    ??? Broken bones Neg Hx    ??? Clotting disorder Neg Hx    ??? Collagen disease Neg Hx    ??? Diabetes Neg Hx    ??? Dislocations Neg Hx    ??? Fibromyalgia Neg Hx    ??? Gout Neg Hx    ??? Hemophilia Neg Hx    ??? Osteoporosis Neg Hx    ??? Rheumatologic disease Neg Hx    ??? Scoliosis Neg Hx    ??? Severe sprains Neg Hx    ??? Sickle cell anemia Neg Hx    ??? Spinal Compression Fracture Neg Hx    ??? GU problems Neg Hx    ??? Kidney cancer Neg Hx    ??? Prostate cancer Neg Hx Allergies: Nitrofurantoin and Lipitor [atorvastatin]                  Objective Findings  Precautions / Restrictions  Precautions: Falls  precautions  Weight Bearing Status: Non-applicable  Required Braces or Orthoses: Non-applicable     Communication Preference: Verbal          Pain Comments: patient denies pain  Medical Tests / Procedures: Reviewed patient chart, medical imaging, vitals, labs, orders prior to evaluation  Equipment / Environment: Patient not wearing mask for full session, Vascular access (PIV, TLC, Port-a-cath, PICC), Supplemental oxygen, Caregiver wearing mask for full session (5L o2 via nasal cannula start/end of session)     At Rest: HR 58; SpO2 94%  With Activity: HR 66; SpO2 84%, recovers to >92% with titration from 4>6L  Orthostatics: asymptomatic        Living Situation  Living Environment: House  Lives With: Daughter, Family  Home Living: One level home, Stairs to enter with rails, Walk-in shower, Grab bars in shower, Shower chair with back, Grab bars around toilet, Handicapped height toilet  Rail placement (outside): Bilateral rails  Number of Stairs to Enter (outside): 2      Cognition: WFL  Cognition comment: pleasant and cooperative        Skin Inspection: Intact where visualized     Upper Extremities  UE ROM: Right WFL, Left WFL  UE Strength: Right WFL, Left WFL    Lower Extremities  LE ROM: Right WFL, Left WFL  LE Strength: Right WFL, Left WFL     Sensation: WFL  Sensation comment: grossly intact throughout per patient report  Balance: Impaired  Balance comment: utilizes AD at baseline  Posture: Impaired  Posture comment: forward flexed posture/kyphosis. baseline.      Bed Mobility: Supine to Sit  Bed Mobility: supine/sit with stand by assist, elevated HOB. increased time/effort. sits EOB with independence.     Transfers: Sit to Stand  Transfer comments: sit/stand with mod independence, utilizes UE and rollator. cues for sequence. performed x3 trials to/from low bed.      Gait Level of Assistance: Standby assist, set-up cues, supervision of patient - no hands on  Gait Assistive Device: Four wheel walker  Gait Distance Ambulated (ft): 25 ft  Gait: slow gait, cues for self pacing/energy conservation. step thru pattern noted                  Endurance: poor     Physical Therapy Session Duration  PT Individual [mins]: 25     Medical Staff Made Aware: RN Amil Amen     I attest that I have reviewed the above information.  Signed: Patrica Duel, PT  [facility] 12/07/2021

## 2021-12-08 LAB — CBC
HEMATOCRIT: 26.3 % — ABNORMAL LOW (ref 34.0–44.0)
HEMOGLOBIN: 7.9 g/dL — ABNORMAL LOW (ref 11.3–14.9)
MEAN CORPUSCULAR HEMOGLOBIN CONC: 30.2 g/dL — ABNORMAL LOW (ref 32.0–36.0)
MEAN CORPUSCULAR HEMOGLOBIN: 22.5 pg — ABNORMAL LOW (ref 25.9–32.4)
MEAN CORPUSCULAR VOLUME: 74.7 fL — ABNORMAL LOW (ref 77.6–95.7)
MEAN PLATELET VOLUME: 8.2 fL (ref 6.8–10.7)
PLATELET COUNT: 269 10*9/L (ref 150–450)
RED BLOOD CELL COUNT: 3.52 10*12/L — ABNORMAL LOW (ref 3.95–5.13)
RED CELL DISTRIBUTION WIDTH: 17.3 % — ABNORMAL HIGH (ref 12.2–15.2)
WBC ADJUSTED: 8.4 10*9/L (ref 3.6–11.2)

## 2021-12-08 LAB — BASIC METABOLIC PANEL
ANION GAP: 8 mmol/L (ref 5–14)
ANION GAP: 11 mmol/L (ref 5–14)
BLOOD UREA NITROGEN: 103 mg/dL — ABNORMAL HIGH (ref 9–23)
BLOOD UREA NITROGEN: 105 mg/dL — ABNORMAL HIGH (ref 9–23)
BUN / CREAT RATIO: 24
BUN / CREAT RATIO: 25
CALCIUM: 8.6 mg/dL — ABNORMAL LOW (ref 8.7–10.4)
CALCIUM: 8.6 mg/dL — ABNORMAL LOW (ref 8.7–10.4)
CHLORIDE: 90 mmol/L — ABNORMAL LOW (ref 98–107)
CHLORIDE: 86 mmol/L — ABNORMAL LOW (ref 98–107)
CO2: 33 mmol/L — ABNORMAL HIGH (ref 20.0–31.0)
CO2: 31 mmol/L (ref 20.0–31.0)
CREATININE: 4.32 mg/dL — ABNORMAL HIGH
CREATININE: 4.12 mg/dL — ABNORMAL HIGH
EGFR CKD-EPI (2021) FEMALE: 10 mL/min/{1.73_m2} — ABNORMAL LOW (ref >=60)
EGFR CKD-EPI (2021) FEMALE: 10 mL/min/{1.73_m2} — ABNORMAL LOW (ref >=60)
GLUCOSE RANDOM: 119 mg/dL (ref 70–179)
GLUCOSE RANDOM: 180 mg/dL — ABNORMAL HIGH (ref 70–179)
POTASSIUM: 5 mmol/L — ABNORMAL HIGH (ref 3.4–4.8)
POTASSIUM: 4.8 mmol/L (ref 3.4–4.8)
SODIUM: 131 mmol/L — ABNORMAL LOW (ref 135–145)
SODIUM: 128 mmol/L — ABNORMAL LOW (ref 135–145)

## 2021-12-08 LAB — HEMOGLOBIN AND HEMATOCRIT, BLOOD
HEMATOCRIT: 26.8 % — ABNORMAL LOW (ref 34.0–44.0)
HEMOGLOBIN: 8 g/dL — ABNORMAL LOW (ref 11.3–14.9)

## 2021-12-08 LAB — PHOSPHORUS
PHOSPHORUS: 6.5 mg/dL — ABNORMAL HIGH (ref 2.4–5.1)
PHOSPHORUS: 5.9 mg/dL — ABNORMAL HIGH (ref 2.4–5.1)

## 2021-12-08 LAB — MAGNESIUM
MAGNESIUM: 4.1 mg/dL — ABNORMAL HIGH (ref 1.6–2.6)
MAGNESIUM: 3.9 mg/dL — ABNORMAL HIGH (ref 1.6–2.6)

## 2021-12-08 MED ADMIN — amiodarone (PACERONE) tablet 100 mg: 100 mg | ORAL | @ 15:00:00

## 2021-12-08 MED ADMIN — montelukast (SINGULAIR) tablet 10 mg: 10 mg | ORAL | @ 14:00:00

## 2021-12-08 MED ADMIN — oxyCODONE (OxyCONTIN) 12 hr crush resistant ER/CR tablet 10 mg: 10 mg | ORAL | @ 07:00:00 | Stop: 2021-12-20

## 2021-12-08 MED ADMIN — apixaban (ELIQUIS) tablet 5 mg: 5 mg | ORAL | @ 15:00:00

## 2021-12-08 MED ADMIN — metOLazone (ZAROXOLYN) tablet 10 mg: 10 mg | ORAL | @ 17:00:00

## 2021-12-08 MED ADMIN — umeclidinium (INCRUSE ELLIPTA) 62.5 mcg/actuation inhaler 1 puff: 1 | RESPIRATORY_TRACT | @ 13:00:00

## 2021-12-08 MED ADMIN — furosemide (LASIX) 120 mg in sodium chloride (NS) 0.9 % 50 mL IVPB: 120 mg | INTRAVENOUS | @ 18:00:00

## 2021-12-08 MED ADMIN — apixaban (ELIQUIS) tablet 5 mg: 5 mg | ORAL | @ 03:00:00

## 2021-12-08 MED ADMIN — pravastatin (PRAVACHOL) tablet 10 mg: 10 mg | ORAL | @ 15:00:00

## 2021-12-08 MED ADMIN — pantoprazole (PROTONIX) EC tablet 20 mg: 20 mg | ORAL | @ 15:00:00

## 2021-12-08 MED ADMIN — fluticasone furoate-vilanteroL (BREO ELLIPTA) 100-25 mcg/dose inhaler 1 puff: 1 | RESPIRATORY_TRACT | @ 13:00:00

## 2021-12-08 MED ADMIN — Tafamidis 61 mg capsule - **PATIENT SUPPLIED MEDICATION**: 61 mg | ORAL | @ 15:00:00

## 2021-12-08 MED ADMIN — spironolactone (ALDACTONE) tablet 25 mg: 25 mg | ORAL | @ 15:00:00

## 2021-12-08 MED ADMIN — acetaminophen (TYLENOL) tablet 650 mg: 650 mg | ORAL | @ 12:00:00

## 2021-12-08 MED ADMIN — furosemide (LASIX) 120 mg in sodium chloride (NS) 0.9 % 50 mL IVPB: 120 mg | INTRAVENOUS | @ 21:00:00

## 2021-12-08 MED FILL — VYNDAMAX 61 MG CAPSULE: ORAL | 30 days supply | Qty: 30 | Fill #11

## 2021-12-08 NOTE — Unmapped (Signed)
Patient is alert and oriented X4, junctional rhythm, and 4L nasal cannula. Patient has a purewick and uses the bedside commode. Patient worked with PT and OT and tolerated well. Patient had a bowel movement today.  Problem: Adult Inpatient Plan of Care  Goal: Plan of Care Review  Outcome: Ongoing - Unchanged  Goal: Patient-Specific Goal (Individualized)  Outcome: Ongoing - Unchanged  Goal: Absence of Hospital-Acquired Illness or Injury  Outcome: Ongoing - Unchanged  Intervention: Prevent and Manage VTE (Venous Thromboembolism) Risk  Recent Flowsheet Documentation  Taken 12/07/2021 0800 by [place] Callas, RN  Activity Management: activity adjusted per tolerance  Goal: Optimal Comfort and Wellbeing  Outcome: Ongoing - Unchanged  Goal: Readiness for Transition of Care  Outcome: Ongoing - Unchanged  Goal: Rounds/Family Conference  Outcome: Ongoing - Unchanged     Problem: Self-Care Deficit  Goal: Improved Ability to Complete Activities of Daily Living  Outcome: Ongoing - Unchanged

## 2021-12-08 NOTE — Unmapped (Signed)
Problem: Adult Inpatient Plan of Care  Goal: Plan of Care Review  Outcome: Progressing   Pt is A&O4, SB on tele, On CPAP during night. VSS. Denies pain.family at bedside.   Pt is resting at this time. Will continue to monitor.  Goal: Patient-Specific Goal (Individualized)  Outcome: Progressing  Goal: Absence of Hospital-Acquired Illness or Injury  Outcome: Progressing  Goal: Optimal Comfort and Wellbeing  Outcome: Progressing  Goal: Readiness for Transition of Care  Outcome: Progressing  Goal: Rounds/Family Conference  Outcome: Progressing     Problem: Self-Care Deficit  Goal: Improved Ability to Complete Activities of Daily Living  Outcome: Progressing

## 2021-12-08 NOTE — Unmapped (Signed)
Cardiology - Team 1 [facility]) Progress Note    Assessment & Plan:   [name] is an 80 y/o F w/ a PMHx of [name] (iso cardiac amyloidosis), pAF (s/p DCCV 12/2020 w/ conversion to NSR, on Eliquis), [name] IV, T2DM (A1c 7.0 11/2021), IDA, COPD (on home O2 3.5L), and pHTN who presents for management of fatigue, new junctional rhythm, worsening AKI, troponin elevation, and possible volume overload.    Principal Problem:    AKI (acute kidney injury) (CMS-HCC)  Active Problems:    Spinal stenosis of lumbar region    COPD (chronic obstructive pulmonary disease) (CMS-HCC)    Sleep apnea in adult    Essential hypertension    Type 2 diabetes mellitus with stage 3b chronic kidney disease, without long-term current use of insulin (CMS-HCC)    Anemia in chronic kidney disease    Chronic kidney disease, stage III (moderate) (CMS-HCC)    Pulmonary hypertension (CMS-HCC)    Chronic prescription opiate use    Cardiac amyloidosis (CMS-HCC)  Resolved Problems:    * No resolved hospital problems. *    [name] (LVEF 55%, GIIDD) -- Cardiac Amyloidosis:   She's had issues with hypotension on ARB and BB, as well as atrial arrhythmias. SPEP/UPEP/free light chains without concern. PYP NM SPECT + ??for ATTR amyloid, grade 3 uptake. 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 Recently hospitalized and discharged on 12/6 for [name] exacerbation managed with [name] and metolazone. BNP is elevated to 854, which is elevated from prior.  On 11/29/2021 BNP was 622, on 11/14/2021, BNP was 301.  Last echo on 03/2021 showed LVEF greater than 55% and grade 2 diastolic dysfunction.  Dry weight is 214 pounds.  Interestingly, as of 12/03/2021, patient was at her dry weight of 213 pounds.  Despite lack of overt volume overload signs and symptoms, given elevated BNP, subjective fatigue, and worsening AKI, feel that patient does need continued diuresis. Elevate troponin trended down. Variabilities in troponin is expected with amyloidosis. Given that patient did not have any ACS symptoms, will stop trending.  -Continue spironolactone   -Holding torsemide given that patient has not responded to her outpatient management   -S/p 120, was net negative 600   -Started Lasix 120 BID+Metolazone 10 daily   -Continued Tafamidis 61 mg daily   -Strict I/Os   -Daily weight     Paroxysmal AF -- Atrial Fibrillation -- New Junctional Rhythm:  Paroxysmal atrial fibrillation, most recently has remained in normal sinus rhythm but as of this hospitalization is in a junctional rhythm.  On amiodarone and apixaban at home. CHA2DS2-VASc score = 5 (1-gender, 2-Age, 1-DM, 1-HTN).  She did have a cardioversion on 01/10/2021 and was in normal sinus rhythm afterwards. EKG showed sinus rhythm with 1st degree AV block and low voltage Q waves.   -Continue Eliquis 5 mg BID   -Amiodarone reduced dose 100 mg daily 2/2 to bothersome tremor as a side effect     AKI on [name] IV:  GFR 10 on admission, acutely worsened.  Creatinine 4.60, up from 2.5 on 12/02/2021.  BUN elevated to 87, up from 67.  BUN/creatinine ratio is 20, suggesting possible prerenal/cardiorenal etiology, although because of acute rise given stabilization with discharge from hospital 4 days ago is unclear.  Patient is producing urine, although decreased from prior.  Has a pure wick in place. Renal US unremarkable. Calculate Fena at 1.6%, saying intrinsic damage, but most likely it is pre-renal AKi on top of ongoing [name].  Creatinine has improved from 4.37 to 4.32.   - Avoid nephrotoxic agents  - Trend BMP, Phos  - Holding gabapentin given the AKI     T2DM (HbA1c 7.0%):  Patient is reporting increased blood sugar readings at home - was previously on metformin in 2021 but this was discontinued given normal A1c.  Hemoglobin A1c is newly elevated to 7.0 from 5.7 previously, last 05/2020 in the system.  - SSI  - Restart metformin on discharge  - Continue Statin      Chronic Microcytic Anemia -- Iron Deficiency Anemia:  Hemoglobin at baseline of around 8.8, likely related to [name] 4, AoCD, and iron deficiency anemia.  Has received IV iron in the past. Iron at 12 and saturation at less than 4%  - Trend CBC    Group 2 pHTN:  Followed by Dr. [name], [facility] pulmonology, thought to be secondary to either [name] or COPD.  - ctm    COPD -- Chronic Hypoxemic Respiratory Failure:   On 3L at home at baseline. No evidence of exacerbation at this time.  Arrived to floor on 4 L nasal cannula.    - Home nebs  - Albuterol as needed  - IS    Daily Checklist:  Diet: Regular Diet  DVT PPx: Full anticoagulation   Electrolytes: Replete Potassium to >/=4 and Magnesium to >/=2  Code Status: Full Code  Dispo: TBD    Team Contact Information:   Primary Team: Cardiology - Team 1 ([facility])  Primary Resident: Yehuda Savannah, MD  Resident's Pager: [phone] (Cardiology Team 1 Intern)    Interval History:   No acute events overnight.  Patient was resting in bed this morning. She was breathing comfortably on 3 L of oxygen. She did not complain of shortness of breath, chest pain. She does not have swollen extremities. Still JVD detected below the earlobe. Will benefit from further diuresis.   All other systems were reviewed and are negative except as noted in the HPI    Objective:     Temp:  [36.4 ??C (97.6 ??F)-36.9 ??C (98.4 ??F)] 36.9 ??C (98.4 ??F)  Heart Rate:  [52-55] 53  SpO2 Pulse:  [47-55] 47  Resp:  [18-20] 18  BP: (101-144)/(44-62) 112/49  SpO2:  [92 %-99 %] 92 %,     Intake/Output Summary (Last 24 hours) at 12/08/2021 1301  Last data filed at 12/07/2021 2000  Gross per 24 hour   Intake 200 ml   Output 200 ml   Net 0 ml   , No intake/output data recorded.,  , O2 Device: Nasal cannula  O2 Flow Rate (L/min):  [4 L/min] 4 L/min, There is no height or weight on file to calculate BMI.,   Wt Readings from Last 3 Encounters:   12/03/21 96.6 kg (213 lb)   12/02/21 100.3 kg (221 lb 1.9 oz)   11/17/21 100.2 kg (221 lb)       Gen: in NAD, answers questions appropriately  Eyes: sclera anicteric, EOMI  HENT: atraumatic, MMM, OP w/o erythema or exudate   Heart: RRR, S1, S2, no M/R/G, no chest wall tenderness, JVD present below the earlobe   Lungs: CTAB, no crackles or wheezes, no use of accessory muscles  Abdomen: Normoactive bowel sounds, soft, NTND, no rebound/guarding  Extremities: no clubbing, cyanosis, or edema in the BLEs  Psych: Alert, oriented, appropriate mood and affect    Labs/Studies: Labs and Studies from the last 24hrs per EMR and Reviewed    Yehuda Savannah, MD  PGY1, Internal Medicine

## 2021-12-08 NOTE — Unmapped (Signed)
OCCUPATIONAL THERAPY  Evaluation (12/07/21 1524)    Patient Name:  Barbara Huber       Medical Record Number: [id number]   Date of Birth: 1941-01-17  Sex: Female          OT Treatment Diagnosis:  Pt presents to skilled OT services with generalized weakness and decreased activity tolerance impacting ADL performance    Problem List: Decreased endurance, Fall risk, Impaired balance, Decreased coordination, Impaired ADLs, Decreased strength        Clinical Decision Making: Moderate  Assessment: Barbara Huber is an 80 y/o F w/ a PMHx of [name] (iso cardiac amyloidosis), pAF (s/p DCCV 12/2020 w/ conversion to NSR, on Eliquis), [name] IV, T2DM (A1c 7.0 11/2021), IDA, COPD (on home O2 3.5L), and pHTN who presents for management of fatigue, new junctional rhythm, worsening AKI, troponin elevation, and possible volume overload. Barbara Huber participated well during today's OT evaluation, completing bed mobility, room level ambulation, and toileting. The patient has a supportive family who is present in the room. The patient is currently limited by decreased coordination, generalized weakness, and decreased activity tolerance impacting her safety and independence with ADLs. The patient would continue to benefit from skilled OT services to address limitations and decrease caregiver burden. The patient is currently appropriate for post-acute discharge recommendation of 3x weekly. Pt Is currently at baseline level of function. After review of the patient's occupational profile and history, assessment of occupational performance, clinical decision making, and development of POC, the patient presents as a moderate complexity case.    Today's Interventions: Other  Today's Interventions: Pt educated on role of OT, POC, modification of ADLs, progressing OOB mobility, and use of IS. Pt completed bed mobility, STS from EOB, toilet transfer, room level amb with rollator. Pt completed LB dressing to don B shoes with MIN A, and completed toileting routine.    Activity Tolerance During Today's Session  Tolerated treatment well    Plan  Planned Frequency of Treatment:  1-2x per day for: 3-4x week  Planned Treatment Duration: 01/04/22    Planned Interventions:  ADL retraining, Compensatory tech. training, Bed mobility, Balance activities, Environmental support, Functional cognition, Conservation, Home exercise program, Education - Patient, Functional mobility, Endurance activities, Education - Family / caregiver, [employer], Teacher, early years/pre, UE Strength / coordination exercise, Safety education, Therapeutic exercise    Post-Discharge Occupational Therapy Recommendations:   3x weekly   OT DME Recommendations: Three in one commode (at pt's discretion) -        GOALS:   Patient and Family Goals: To return home as soon as possible    IP Long Term Goal #1: Pt will score 22+/24 on AMPAC in 4 weeks       Short Term:  Pt will complete full body dressing with MOD I including clothing retrieval using AE/LRAD   Time Frame : 2 weeks  Pt will complete toilet transfer and toileting routine with MOD I using LRAD   Time Frame : 2 weeks  Pt will complete 5+ minute standing bimanual grooming with MOD I using LRAD   Time Frame : 2 weeks                  Prognosis:  Good  Positive Indicators:  PLOF, social support  Barriers to Discharge: Endurance deficits    Subjective  Current Status Pt received and left semi-reclined in bed, call bell in reach, all immediate needs met, RN updated. Daughters present  Prior Functional Status Pt reports being MOD I  at baseline using a rollator for functional mobility, living with daughter and son in law. Pt on 3.5 L O2 at home. Pt denied falls and does not drive    Medical Tests / Procedures: EPIC reviewed       Patient / Caregiver reports: Pt minimally conversing but agreeable to participate in OT eval    Past Medical History:   Diagnosis Date   ??? Acute kidney injury superimposed on chronic kidney disease (CMS-HCC) 10/11/2020 ??? Acute on chronic diastolic (congestive) heart failure (CMS-HCC) 08/23/2020   ??? Arthritis    ??? Calculus of kidney    ??? Calculus of ureter    ??? CHF (congestive heart failure) (CMS-HCC)    ??? Chronic atrial fibrillation (CMS-HCC) 07/20/2019   ??? COPD (chronic obstructive pulmonary disease) (CMS-HCC)    ??? Diabetes (CMS-HCC)    ??? Gangrenous cholecystitis 10/11/2020   ??? GERD (gastroesophageal reflux disease)    ??? Hydronephrosis    ??? Hypertension    ??? Hyponatremia 10/11/2020   ??? Intermediate coronary syndrome (CMS-HCC) 03/13/2014   ??? Lower extremity edema 09/28/2020   ??? Lumbar stenosis    ??? Microscopic hematuria    ??? Nausea alone    ??? Nephrolithiasis 04/17/2016   ??? Neuropathy    ??? Nocturia    ??? Other chronic cystitis    ??? Pulmonary hypertension (CMS-HCC)    ??? Renal colic    ??? Sleep apnea    ??? Unstable angina pectoris (CMS-HCC) 03/13/2014    Social History     Tobacco Use   ??? Smoking status: Former     Packs/day: 0.33     Types: Cigarettes     Quit date: 2019     Years since quitting: 3.9   ??? Smokeless tobacco: Never   ??? Tobacco comments:     Quit a few years ago   Substance Use Topics   ??? Alcohol use: No      Past Surgical History:   Procedure Laterality Date   ??? BACK SURGERY  1995   ??? CARPAL TUNNEL RELEASE Left 2014   ??? HYSTERECTOMY  1971   ??? IR INSERT CHOLECYSTOSMY TUBE PERCUTANEOUS  10/02/2020    IR INSERT CHOLECYSTOSMY TUBE PERCUTANEOUS 10/02/2020 Braulio Conte, MD IMG VIR [facility]   ??? LUMBAR DISC SURGERY     ??? PR REMOVAL GALLBLADDER N/A 10/06/2020    Procedure: CHOLECYSTECTOMY;  Surgeon: Katherina Mires, MD;  Location: MAIN OR [facility];  Service: Trauma   ??? PR RIGHT HEART CATH O2 SATURATION & CARDIAC OUTPUT N/A 09/30/2020    Procedure: Right Heart Catheterization;  Surgeon: Neal Dy, MD;  [facility] CATH;  Service: Cardiology   ??? PR RIGHT HEART CATH O2 SATURATION & CARDIAC OUTPUT N/A 01/09/2021    Procedure: Right Heart Catheterization;  Surgeon: Lesle Reek, MD;  Location: [facility] CATH; Service: Cardiology    Family History   Problem Relation Age of Onset   ??? Hypertension Mother    ??? Cancer Father         COLON CANCER   ??? Anesthesia problems Neg Hx    ??? Broken bones Neg Hx    ??? Clotting disorder Neg Hx    ??? Collagen disease Neg Hx    ??? Diabetes Neg Hx    ??? Dislocations Neg Hx    ??? Fibromyalgia Neg Hx    ??? Gout Neg Hx    ??? Hemophilia Neg Hx    ??? Osteoporosis Neg Hx    ???  Rheumatologic disease Neg Hx    ??? Scoliosis Neg Hx    ??? Severe sprains Neg Hx    ??? Sickle cell anemia Neg Hx    ??? Spinal Compression Fracture Neg Hx    ??? GU problems Neg Hx    ??? Kidney cancer Neg Hx    ??? Prostate cancer Neg Hx         Nitrofurantoin and Lipitor [atorvastatin]     Objective Findings  Precautions / Restrictions  Falls precautions    Weight Bearing  Non-applicable    Required Braces or Orthoses  Non-applicable    Communication Preference  Verbal    Pain  Pt denied pain    Equipment / Environment  Patient not wearing mask for full session, Vascular access (PIV, TLC, Port-a-cath, PICC), Supplemental oxygen, Caregiver wearing mask for full session, Purewick/Condom catheter, Telemetry (4 L [place])    Living Situation  Living Environment: House  Lives With: Daughter, Family (Son in Social worker)  Home Living: One level home, Stairs to enter with rails, Walk-in shower, Grab bars in shower, Shower chair with back, Handicapped height toilet  Rail placement (outside): Bilateral rails  Number of Stairs to [employer] (outside): 2  Equipment available at home: Rollator, Air traffic controller chair with back     Cognition   Orientation Level:  Oriented x 4   Arousal/Alertness:  Appropriate responses to stimuli   Attention Span:  Appears intact   Memory:  Appears intact   Following Commands:  Follows all commands and directions without difficulty   Safety Judgment:  Good awareness of safety precautions   Awareness of Errors:  Good awareness of errors made   Problem Solving:  Able to problem solve independently   Comments: Pt with intact, age-appropriate cognition.    Vision / Hearing   Vision: Wears glasses all the time  Vision Comments: Denied recent blurred/double vision. Pt states she has had blurred vision in the past  Hearing: No deficit identified         Hand Function:  Right Hand Function: Right hand function impaired  Right Hand Impairment: grip strength good, coordination impaired, new onset tremor  Left Hand Function: Left hand function impaired  Left Hand Impairment: grip strength good, coordination impaired, new onset tremor  Hand Function comments: Pt endorses myocolonic jerks in BUE for past 2 months which impact fine motor coordination  Hand Dominance: Right    Skin Inspection:  Skin Inspection: Intact where visualized    ROM / Strength:  UE ROM/Strength: Left WFL, Right WFL  UE ROM/ Strength Comment: Pt presents with myocolonic jerks impacting gross motor movements  LE ROM/Strength: Left WFL, Right WFL    Coordination:  Coordination: Impaired    Sensation:  RUE Sensation: RUE intact  LUE Sensation: LUE intact  RLE Sensation: RLE intact  LLE Sensation: LLE intact    Balance:  SBA for dynamic sitting and standing    Functional Mobility  Transfer Assistance Needed: Yes  Transfers - Needs Assistance: Contact Guard assist, Min assist (CGA for STS from EOB; MIN A for toilet transfer from low toilet in bathroom)  Bed Mobility Assistance Needed: Yes  Bed Mobility - Needs Assistance: Standby assist  Ambulation: Pt completed room level ambulation to bathroom using rollator with CGA      ADLs  ADLs: Needs assistance with ADLs  ADLs - Needs Assistance: LB dressing, Toileting, Grooming, Bathing, Feeding, UB dressing  Feeding - Needs Assistance: Set Up Assist, Performed seated  Grooming - Needs Assistance: Set  Up Assist, Performed seated  Bathing - Needs Assistance: Min assist, Performed seated  Toileting - Needs Assistance: Min assist, Performed seated  UB Dressing - Needs Assistance: Set Up Assist, Performed seated  LB Dressing - Needs Assistance: Min assist, Performed seated  IADLs: NT    AM-PAC-Daily Activity  Lower Body Dressing assistance needs: A Little - Minimal/Contact Guard Assist/Supervision  Bathing assistance needs: A Little - Minimal/Contact Guard Assist/Supervision  Toileting assistance needs: A Little - Minimal/Contact Guard Assist/Supervision  Upper Body Dressing assistance needs: A Little - Minimal/Contact Guard Assist/Supervision  Personal Grooming assistance needs: A Little - Minimal/Contact Guard Assist/Supervision  Eating Meals assistance needs: A Little - Minimal/Contact Guard Assist/Supervision    Daily Activity Score:  Daily Activity Score: 18    Score (in points): % of Functional Impairment, Limitation, Restriction  6: 100% impaired, limited, restricted  7-8: At least 80%, but less than 100% impaired, limited restricted  9-13: At least 60%, but less than 80% impaired, limited restricted  14-19: At least 40%, but less than 60% impaired, limited restricted  20-22: At least 20%, but less than 40% impaired, limited restricted  23: At least 1%, but less than 20% impaired, limited restricted  24: 0% impaired, limited restricted      Vitals / Orthostatics  At Rest: VSS  With Activity: VSS  Vitals/Orthostatics: Asymptomatic      Medical Staff Made Aware: RN updated and aware      Occupational Therapy Session Duration  OT Individual [mins]: 30         I attest that I have reviewed the above information.  Signed: Sande Rives, OT  Filed 12/07/2021

## 2021-12-08 NOTE — Unmapped (Signed)
Care Management  Initial Transition Planning Assessment    CM met with patient in pt room.  Pt/visitors were not wearing hospital provided masks for the duration of the interaction.   CM was wearing hospital provided surgical mask and hospital provided eye protection.  CM was within 6 foot of the patient/visitors during this interaction.               General  Care Manager assessed the patient by : In person interview with patient  Orientation Level: Oriented X4  Functional level prior to admission: Independent  Reason for referral: Discharge Planning    Contact/Decision Maker  Extended Emergency Contact Information  Primary Emergency Contact: [name],[name]  Address: [address]           [place], [place] [address] [place]  Home Phone: [phone]  Work Phone: [phone]  Mobile Phone: [phone]  Relation: Daughter    Legal Next of Kin / Guardian / POA / Advance Directives     HCDM (patient stated preference): [name],[name] - Daughter - [phone]    Advance Directive (Medical Treatment)  Does patient have an advance directive covering medical treatment?: Patient has advance directive covering medical treatment, copy in chart.    Health Care Decision Maker [HCDM] (Medical & Mental Health Treatment)  Healthcare Decision Maker: HCDM documented in the HCDM/Contact Info section.  Information offered on HCDM, Medical & Mental Health advance directives:: Patient declined information.    Advance Directive (Mental Health Treatment)  Does patient have an advance directive covering mental health treatment?: Patient has advance directive covering mental health treatment, copy in chart.    Readmission Information    Have you been hospitalized in the last 30 days?: No    Did the following happen with your discharge?        Patient Information  Lives with: Family members    Type of Residence: Private residence        Location/Detail: Patient lives with her daughter Mardi Mainland at the address on the facesheet. 2 steps to enter, everythin on one level    Support Systems/Concerns: Family Members    Responsibilities/Dependents at home?: No    Home Care services in place prior to admission?: No                  Equipment Currently Used at Home: walker, rolling, respiratory supplies, wheelchair, manual, commode chair (Trelegy, CPAP, oxygen 3.5 LNC continuous from [employer])       Currently receiving outpatient dialysis?: No       Financial Information       Need for financial assistance?: No       Social Determinants of Health  Social Determinants of Health were addressed in provider documentation.  Please refer to patient history.    Complex Discharge Information    Is patient identified as a difficult/complex discharge?: No    Interventions:       Discharge Needs Assessment  Concerns to be Addressed: discharge planning    Clinical Risk Factors: > 65, Multiple Diagnoses (Chronic)    Barriers to taking medications: No    Prior overnight hospital stay or [name] in last 90 days: No    Discharge Facility/Level of Care Needs:  (pending)    Readmission  Risk of Unplanned Readmission Score: UNPLANNED READMISSION SCORE: 35.88%  Predictive Model Details          36% (High)  Factor Value    Calculated 12/08/2021 12:03 20% Number of active  Rx orders 43    [place] Risk of Unplanned Readmission Model 15% Number of ED visits in last six months 4     13% Number of hospitalizations in last year 4     6% ECG/EKG order present in last 6 months     6% Latest calcium low (8.6 mg/dL)     5% Latest BUN high (103 mg/dL)     5% Imaging order present in last 6 months     4% Age 80     4% Latest hemoglobin low (7.9 g/dL)     4% Phosphorous result present     3% Charlson Comorbidity Index 4     3% Diagnosis of deficiency anemia present     3% Active anticoagulant Rx order present     3% Latest creatinine high (4.32 mg/dL)     3% Diagnosis of renal failure present     1% Future appointment scheduled     1% Current length of stay 1.712 days 1% Active ulcer medication Rx order present      Readmitted Within the Last 30 Days? (No if blank) Yes  Patient at risk for readmission?: Yes    Discharge Plan  Screen findings are: Care Manager reviewed the plan of the patient's care with the Multidisciplinary Team. No discharge planning needs identified at this time. Care Manager will continue to manage plan and monitor patient's progress with the team.    Expected Discharge Date: 12/11/2021    Expected Transfer from Critical Care:      Quality data for continuing care services shared with patient and/or representative?: N/A  Patient and/or family were provided with choice of facilities / services that are available and appropriate to meet post hospital care needs?: Yes   List choices in order highest to lowest preferred, if applicable. : prefers [facility] for [facility] and [facility] for DME    Initial Assessment complete?: Yes        Mora Bellman, RN   Care Manager  Desk Phone: [phone]; Pager: [phone]

## 2021-12-09 LAB — BASIC METABOLIC PANEL
ANION GAP: 10 mmol/L (ref 5–14)
ANION GAP: 9 mmol/L (ref 5–14)
BLOOD UREA NITROGEN: 114 mg/dL — ABNORMAL HIGH (ref 9–23)
BLOOD UREA NITROGEN: 104 mg/dL — ABNORMAL HIGH (ref 9–23)
BUN / CREAT RATIO: 31
BUN / CREAT RATIO: 30
CALCIUM: 8.7 mg/dL (ref 8.7–10.4)
CALCIUM: 9.5 mg/dL (ref 8.7–10.4)
CHLORIDE: 89 mmol/L — ABNORMAL LOW (ref 98–107)
CHLORIDE: 86 mmol/L — ABNORMAL LOW (ref 98–107)
CO2: 33 mmol/L — ABNORMAL HIGH (ref 20.0–31.0)
CO2: 34 mmol/L — ABNORMAL HIGH (ref 20.0–31.0)
CREATININE: 3.72 mg/dL — ABNORMAL HIGH
CREATININE: 3.48 mg/dL — ABNORMAL HIGH
EGFR CKD-EPI (2021) FEMALE: 12 mL/min/{1.73_m2} — ABNORMAL LOW (ref >=60)
EGFR CKD-EPI (2021) FEMALE: 13 mL/min/{1.73_m2} — ABNORMAL LOW (ref >=60)
GLUCOSE RANDOM: 114 mg/dL (ref 70–179)
GLUCOSE RANDOM: 211 mg/dL — ABNORMAL HIGH (ref 70–179)
POTASSIUM: 4.3 mmol/L (ref 3.4–4.8)
POTASSIUM: 4.1 mmol/L (ref 3.4–4.8)
SODIUM: 132 mmol/L — ABNORMAL LOW (ref 135–145)
SODIUM: 129 mmol/L — ABNORMAL LOW (ref 135–145)

## 2021-12-09 LAB — COMPREHENSIVE METABOLIC PANEL
ALBUMIN: 3.1 g/dL — ABNORMAL LOW (ref 3.4–5.0)
ALKALINE PHOSPHATASE: 63 U/L (ref 46–116)
ALT (SGPT): <7 U/L — ABNORMAL LOW (ref 10–49)
ANION GAP: 9 mmol/L (ref 5–14)
AST (SGOT): 10 U/L (ref <=34)
BILIRUBIN TOTAL: 0.5 mg/dL (ref 0.3–1.2)
BLOOD UREA NITROGEN: 87 mg/dL — ABNORMAL HIGH (ref 9–23)
BUN / CREAT RATIO: 23
CALCIUM: 8.4 mg/dL — ABNORMAL LOW (ref 8.7–10.4)
CHLORIDE: 89 mmol/L — ABNORMAL LOW (ref 98–107)
CO2: 33 mmol/L — ABNORMAL HIGH (ref 20.0–31.0)
CREATININE: 3.85 mg/dL — ABNORMAL HIGH
EGFR CKD-EPI (2021) FEMALE: 11 mL/min/{1.73_m2} — ABNORMAL LOW (ref >=60)
GLUCOSE RANDOM: 109 mg/dL (ref 70–179)
POTASSIUM: 4.2 mmol/L (ref 3.4–4.8)
PROTEIN TOTAL: 7 g/dL (ref 5.7–8.2)
SODIUM: 131 mmol/L — ABNORMAL LOW (ref 135–145)

## 2021-12-09 LAB — MAGNESIUM
MAGNESIUM: 3.8 mg/dL — ABNORMAL HIGH (ref 1.6–2.6)
MAGNESIUM: 3.9 mg/dL — ABNORMAL HIGH (ref 1.6–2.6)

## 2021-12-09 LAB — CBC
HEMATOCRIT: 23.7 % — ABNORMAL LOW (ref 34.0–44.0)
HEMOGLOBIN: 7.3 g/dL — ABNORMAL LOW (ref 11.3–14.9)
MEAN CORPUSCULAR HEMOGLOBIN CONC: 30.9 g/dL — ABNORMAL LOW (ref 32.0–36.0)
MEAN CORPUSCULAR HEMOGLOBIN: 22.9 pg — ABNORMAL LOW (ref 25.9–32.4)
MEAN CORPUSCULAR VOLUME: 74.1 fL — ABNORMAL LOW (ref 77.6–95.7)
MEAN PLATELET VOLUME: 8.2 fL (ref 6.8–10.7)
PLATELET COUNT: 267 10*9/L (ref 150–450)
RED BLOOD CELL COUNT: 3.2 10*12/L — ABNORMAL LOW (ref 3.95–5.13)
RED CELL DISTRIBUTION WIDTH: 17.5 % — ABNORMAL HIGH (ref 12.2–15.2)
WBC ADJUSTED: 7.2 10*9/L (ref 3.6–11.2)

## 2021-12-09 LAB — PHOSPHORUS
PHOSPHORUS: 5.4 mg/dL — ABNORMAL HIGH (ref 2.4–5.1)
PHOSPHORUS: 4.9 mg/dL (ref 2.4–5.1)

## 2021-12-09 LAB — LACTATE, VENOUS, WHOLE BLOOD: LACTATE BLOOD VENOUS: 0.8 mmol/L (ref 0.5–1.8)

## 2021-12-09 MED ADMIN — pravastatin (PRAVACHOL) tablet 10 mg: 10 mg | ORAL | @ 15:00:00

## 2021-12-09 MED ADMIN — amiodarone (PACERONE) tablet 100 mg: 100 mg | ORAL | @ 15:00:00

## 2021-12-09 MED ADMIN — Tafamidis 61 mg capsule - **PATIENT SUPPLIED MEDICATION**: 61 mg | ORAL | @ 15:00:00

## 2021-12-09 MED ADMIN — sodium ferric gluconate (FERRLECIT) 250 mg in sodium chloride (NS) 0.9 % 100 mL IVPB: 250 mg | INTRAVENOUS | @ 17:00:00 | Stop: 2021-12-10

## 2021-12-09 MED ADMIN — fluticasone furoate-vilanteroL (BREO ELLIPTA) 100-25 mcg/dose inhaler 1 puff: 1 | RESPIRATORY_TRACT | @ 15:00:00

## 2021-12-09 MED ADMIN — pantoprazole (PROTONIX) EC tablet 20 mg: 20 mg | ORAL | @ 15:00:00

## 2021-12-09 MED ADMIN — furosemide (LASIX) 120 mg in sodium chloride (NS) 0.9 % 50 mL IVPB: 120 mg | INTRAVENOUS | @ 11:00:00 | Stop: 2021-12-09

## 2021-12-09 MED ADMIN — furosemide (LASIX) 120 mg in sodium chloride (NS) 0.9 % 50 mL IVPB: 120 mg | INTRAVENOUS | @ 20:00:00 | Stop: 2021-12-09

## 2021-12-09 MED ADMIN — metOLazone (ZAROXOLYN) tablet 10 mg: 10 mg | ORAL | @ 15:00:00

## 2021-12-09 MED ADMIN — oxyCODONE (OxyCONTIN) 12 hr crush resistant ER/CR tablet 10 mg: 10 mg | ORAL | @ 02:00:00 | Stop: 2021-12-20

## 2021-12-09 MED ADMIN — oxyCODONE (OxyCONTIN) 12 hr crush resistant ER/CR tablet 10 mg: 10 mg | ORAL | @ 15:00:00 | Stop: 2021-12-20

## 2021-12-09 MED ADMIN — furosemide (LASIX) 120 mg in sodium chloride (NS) 0.9 % 50 mL IVPB: 120 mg | INTRAVENOUS | @ 23:00:00

## 2021-12-09 MED ADMIN — umeclidinium (INCRUSE ELLIPTA) 62.5 mcg/actuation inhaler 1 puff: 1 | RESPIRATORY_TRACT | @ 15:00:00

## 2021-12-09 MED ADMIN — spironolactone (ALDACTONE) tablet 25 mg: 25 mg | ORAL | @ 15:00:00

## 2021-12-09 NOTE — Unmapped (Signed)
A&Ox4, VSS on 3.5L/min via [place]. IVPB Lasix order increased to TID from BID. Gave 2 doses this shift. Foley inserted per order by NA. Strict I/O maintained.     No BM this shift.    Problem: Adult Inpatient Plan of Care  Goal: Plan of Care Review  Outcome: Progressing

## 2021-12-09 NOTE — Unmapped (Addendum)
Pt A&Ox4. Pt on 4L [place]. Pt started on 120 mg lasix twice a day and metolazone. Purewick in place for accurate I/Os. Pt had blood stool, MD notified.    Problem: Adult Inpatient Plan of Care  Goal: Plan of Care Review  Outcome: Ongoing - Unchanged  Goal: Patient-Specific Goal (Individualized)  Outcome: Ongoing - Unchanged  Goal: Absence of Hospital-Acquired Illness or Injury  Outcome: Ongoing - Unchanged  Intervention: Prevent Skin Injury  Recent Flowsheet Documentation  Taken 12/08/2021 0800 by Alberteen Spindle, RN  Skin Protection: adhesive use limited  Goal: Optimal Comfort and Wellbeing  Outcome: Ongoing - Unchanged  Goal: Readiness for Transition of Care  Outcome: Ongoing - Unchanged  Goal: Rounds/Family Conference  Outcome: Ongoing - Unchanged     Problem: Self-Care Deficit  Goal: Improved Ability to Complete Activities of Daily Living  Outcome: Ongoing - Unchanged

## 2021-12-09 NOTE — Unmapped (Signed)
Cardiology - Team 1 [facility]) Progress Note    Assessment & Plan:   [name] is an 80 y/o F w/ a PMHx of [name] (iso cardiac amyloidosis), pAF (s/p DCCV 12/2020 w/ conversion to NSR, on Eliquis), [name] IV, T2DM (A1c 7.0 11/2021), IDA, COPD (on home O2 3.5L), and pHTN who presents for management of fatigue, new junctional rhythm, worsening AKI, troponin elevation, and possible volume overload.    Principal Problem:    AKI (acute kidney injury) (CMS-HCC)  Active Problems:    Spinal stenosis of lumbar region    COPD (chronic obstructive pulmonary disease) (CMS-HCC)    Sleep apnea in adult    Essential hypertension    Type 2 diabetes mellitus with stage 3b chronic kidney disease, without long-term current use of insulin (CMS-HCC)    Anemia in chronic kidney disease    Chronic kidney disease, stage III (moderate) (CMS-HCC)    Pulmonary hypertension (CMS-HCC)    Chronic prescription opiate use    Cardiac amyloidosis (CMS-HCC)  Resolved Problems:    * No resolved hospital problems. *    [name] (LVEF 55%, GIIDD) -- Cardiac Amyloidosis:   She's had issues with hypotension on ARB and BB, as well as atrial arrhythmias. SPEP/UPEP/free light chains without concern. PYP NM SPECT + ??for ATTR amyloid, grade 3 uptake. 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 Recently hospitalized and discharged on 12/6 for [name] exacerbation managed with [name] and metolazone. BNP is elevated to 854, which is elevated from prior.  On 11/29/2021 BNP was 622, on 11/14/2021, BNP was 301.  Last echo on 03/2021 showed LVEF greater than 55% and grade 2 diastolic dysfunction.  Dry weight is 214 pounds.  Interestingly, as of 12/03/2021, patient was at her dry weight of 213 pounds.  Despite lack of overt volume overload signs and symptoms, given elevated BNP, subjective fatigue, and worsening AKI, feel that patient does need continued diuresis. Elevate troponin trended down. Variabilities in troponin is expected with amyloidosis. Given that patient did not have any ACS symptoms, will stop trending. Echo showed hyperdynamic LV, LVEF 65-70%, LV wall weakness is severely increased. Grade 3 diastolic dysfunction. RV dilated.   -Continue spironolactone   -Holding torsemide given that patient has not responded to her outpatient management   - Continue Lasix 120 BID+Metolazone 10 daily; depending on I/Os s/p Foley placement, consider increasing Lasix to TID   -Continued Tafamidis 61 mg daily   -Strict I/Os; place Foley catheter today for more accurate I/Os  -Daily weight   -Palliative care consulted about goals of care conversation     Hematochizia -- Chronic Microcytic Anemia -- Iron Deficiency Anemia:  Two bloody bowel movements noted last night. Hemoglobin today down to 7.3 from baseline of around 8.8. Blood per rectum at this time thought most likely  to be the result of increased dosing of Eliquis (5 mg up from home dose of 2.5 mg BID). Chronically low hemoglobin likely related to [name] 4, AoCD, and iron deficiency anemia. Has received IV iron in the past. Iron at 12 and saturation at less than 4%.  - Eliquis 5 mg BID held  - Iron transfusion ordered  - Monitor bowel movements for additional signs of GI bleeding  - Trend CBC    Paroxysmal AF -- Atrial Fibrillation -- New Junctional Rhythm:  Paroxysmal atrial fibrillation, most recently has remained in normal sinus rhythm but as of this hospitalization is in a junctional rhythm.  On amiodarone and apixaban at  home. CHA2DS2-VASc score = 5 (1-gender, 2-Age, 1-DM, 1-HTN).  She did have a cardioversion on 01/10/2021 and was in normal sinus rhythm afterwards. EKG showed sinus rhythm with 1st degree AV block and low voltage Q waves.   -Eliquis 5 mg BID discontinued due to new blood per rectum  -Amiodarone reduced dose 100 mg daily 2/2 to bothersome tremor as a side effect     AKI on [name] IV:  GFR 10 on admission, acutely worsened.  Creatinine 4.60, up from 2.5 on 12/02/2021.  BUN elevated to 87, up from 67.  BUN/creatinine ratio is 20, suggesting possible prerenal/cardiorenal etiology, although because of acute rise given stabilization with discharge from hospital 4 days ago is unclear.  Patient is producing urine, although decreased from prior.  Has a pure wick in place. Renal US unremarkable. Calculate Fena at 1.6%, saying intrinsic damage, but most likely it is pre-renal AKi on top of ongoing [name]. Creatinine has improved from 4.32 to 3.72.   - Avoid nephrotoxic agents  -Trend BMP, Phos  - Holding gabapentin given the AKI     T2DM (HbA1c 7.0%):  Patient is reporting increased blood sugar readings at home - was previously on metformin in 2021 but this was discontinued given normal A1c.  Hemoglobin A1c is newly elevated to 7.0 from 5.7 previously, last 05/2020 in the system.  - SSI  - Restart metformin on discharge  - Continue Statin      Group 2 pHTN:  Followed by Dr. [name], [facility] pulmonology, thought to be secondary to either [name] or COPD.  - ctm    COPD -- Chronic Hypoxemic Respiratory Failure:   On 3L at home at baseline. No evidence of exacerbation at this time.  Arrived to floor on 4 L nasal cannula.    - Home nebs  - Albuterol as needed  - IS    Daily Checklist:  Diet: Regular Diet  DVT PPx: Full anticoagulation   Electrolytes: Replete Potassium to >/=4 and Magnesium to >/=2  Code Status: Full Code  Dispo: TBD    Team Contact Information:   Primary Team: Cardiology - Team 1 (MEDC1)  Primary Resident: Yehuda Savannah, MD  Resident's Pager: [phone] (Cardiology Team 1 Intern)    Interval History:   Patient had two grossly bloody bowel movements overnight. Patient was resting in bed this morning, noting she was tired from poor sleep. She was breathing comfortably on 4 L of oxygen by [place]. She denied shortness of breath and chest pain. She denied abdominal pain, pain with stooling, constipation, and any other GI symptoms. She has no BLE edema but does continue to have JVD to just below her earlobe.  .All other systems were reviewed and are negative except as noted in the HPI    Objective:     Temp:  [36.5 ??C (97.7 ??F)-37.2 ??C (99 ??F)] 37.1 ??C (98.7 ??F)  Heart Rate:  [54-68] 68  SpO2 Pulse:  [54-63] 62  Resp:  [16-18] 18  BP: (95-115)/(41-66) 115/66  SpO2:  [90 %-98 %] 93 %,     Intake/Output Summary (Last 24 hours) at 12/09/2021 1303  Last data filed at 12/09/2021 0615  Gross per 24 hour   Intake 138 ml   Output 1700 ml   Net -1562 ml   , No intake/output data recorded.,  , O2 Flow Rate (L/min):  [4 L/min] 4 L/min, Body mass index is 34.12 kg/m??.,   Wt Readings from Last 3 Encounters:   12/09/21 95.9 kg (211  lb 6.7 oz)   12/03/21 96.6 kg (213 lb)   12/02/21 100.3 kg (221 lb 1.9 oz)     Gen: in NAD, answers questions appropriately  Eyes: sclera anicteric, EOMI  HENT: atraumatic, MMM, OP w/o erythema or exudate   Heart: RRR, S1, S2, no M/R/G, no chest wall tenderness, JVD present below the earlobe   Lungs: CTAB, no crackles or wheezes, no use of accessory muscles  Abdomen: Normoactive bowel sounds, soft, NTND, no rebound/guarding  Extremities: no clubbing, cyanosis, or edema in the BLEs  Psych: Alert, oriented, appropriate mood and affect    Labs/Studies: Labs and Studies from the last 24hrs per EMR and Reviewed    Alberteen Sam, MS3    I attest that I have reviewed the student note and that the components of the history of the present illness, the physical exam, and the assessment and plan documented were performed by me or were performed in my presence by the student where I verified the documentation and performed (or re-performed) the exam and medical decision making.     Yehuda Savannah, MD  PGY1, Internal Medicine

## 2021-12-09 NOTE — Unmapped (Signed)
Problem: Adult Inpatient Plan of Care  Goal: Plan of Care Review  Outcome: Progressing  Pt is A&O4, SB on tele, On CPAP during night. pt had 2 bloody BM.   Notified MD. Repeated CBC ,Hb-8.0  VSS. Denies pain.family at bedside.   Pt is resting at this time. Will continue to monitor.  Goal: Patient-Specific Goal (Individualized)  Outcome: Progressing  Goal: Absence of Hospital-Acquired Illness or Injury  Outcome: Progressing  Intervention: Prevent Skin Injury  Recent Flowsheet Documentation  Taken 12/08/2021 2000 by Burnard Leigh, RN  Skin Protection: adhesive use limited  Goal: Optimal Comfort and Wellbeing  Outcome: Progressing  Goal: Readiness for Transition of Care  Outcome: Progressing  Goal: Rounds/Family Conference  Outcome: Progressing     Problem: Self-Care Deficit  Goal: Improved Ability to Complete Activities of Daily Living  Outcome: Progressing

## 2021-12-09 NOTE — Unmapped (Cosign Needed)
Advance Care Planning     Participants: Yehuda Savannah (intern), Prince Solian (MS3), Kathrin Ruddy (Resident), Devota Pace (patients daughter), Barbara Huber (patient)    Discussion/Action:   We had a discussion about the bigger picture of [name]'s disease. Discussed that there is failure of multiple organs including kidneys, hear and lungs that are secondary to her amyloidosis. We discussed that even though today numbers look better than eysterday, overall the nature of the disease is that it's progressive and not reversible. Discussed that we are going to do whatever we can to make [name] feel better, but that even though day to day things might get better or worse, it wouldn't be very surprising if symptoms and frequency of hospitalizations got worse over the next few months.     [name] shared with [place] that she does not like spending time in the hospital and her goal is to get better and leave the hospital. She shared that she is grateful that we shared with her how we view her disease and that her daughter now knows the information as well. [name] shared that she was somewhat surprised because things had been going well from day to day, but that she had read about amyloidosis and knew that it can be very bad.     We briefly discussed that because [name] has several organs that are not working well anymore, in the case that she got very ill and needed CPR, it would be unlikely to bring her back to a state where she would be able to leave the hospital and be at home with her loved ones. I shared that based on what she's shared with [place], it seems like doing chest compression and intubation may cause more harm than good. [name] and Mardi Mainland were receptive to this.     At that point we left [name] and [name] to be together. We will be back on 12/14 to continue discussions. At this time [name] remains FULL CODE. We will also consider engaging palliative care based on ongoing discussion.     Health Care Decision Maker as of 12/09/2021    HCDM (patient stated preference): [name],[name] - Daughter - [phone]

## 2021-12-10 LAB — CBC
HEMATOCRIT: 27 % — ABNORMAL LOW (ref 34.0–44.0)
HEMOGLOBIN: 8.1 g/dL — ABNORMAL LOW (ref 11.3–14.9)
MEAN CORPUSCULAR HEMOGLOBIN CONC: 29.8 g/dL — ABNORMAL LOW (ref 32.0–36.0)
MEAN CORPUSCULAR HEMOGLOBIN: 22.1 pg — ABNORMAL LOW (ref 25.9–32.4)
MEAN CORPUSCULAR VOLUME: 74.2 fL — ABNORMAL LOW (ref 77.6–95.7)
MEAN PLATELET VOLUME: 8.2 fL (ref 6.8–10.7)
PLATELET COUNT: 303 10*9/L (ref 150–450)
RED BLOOD CELL COUNT: 3.64 10*12/L — ABNORMAL LOW (ref 3.95–5.13)
RED CELL DISTRIBUTION WIDTH: 17.1 % — ABNORMAL HIGH (ref 12.2–15.2)
WBC ADJUSTED: 8.7 10*9/L (ref 3.6–11.2)

## 2021-12-10 LAB — BASIC METABOLIC PANEL
ANION GAP: 11 mmol/L (ref 5–14)
ANION GAP: 9 mmol/L (ref 5–14)
BLOOD UREA NITROGEN: 111 mg/dL — ABNORMAL HIGH (ref 9–23)
BLOOD UREA NITROGEN: 106 mg/dL — ABNORMAL HIGH (ref 9–23)
BUN / CREAT RATIO: 32
BUN / CREAT RATIO: 31
CALCIUM: 9.2 mg/dL (ref 8.7–10.4)
CALCIUM: 9 mg/dL (ref 8.7–10.4)
CHLORIDE: 86 mmol/L — ABNORMAL LOW (ref 98–107)
CHLORIDE: 86 mmol/L — ABNORMAL LOW (ref 98–107)
CO2: 36 mmol/L — ABNORMAL HIGH (ref 20.0–31.0)
CO2: 35 mmol/L — ABNORMAL HIGH (ref 20.0–31.0)
CREATININE: 3.48 mg/dL — ABNORMAL HIGH
CREATININE: 3.46 mg/dL — ABNORMAL HIGH
EGFR CKD-EPI (2021) FEMALE: 13 mL/min/{1.73_m2} — ABNORMAL LOW (ref >=60)
EGFR CKD-EPI (2021) FEMALE: 13 mL/min/{1.73_m2} — ABNORMAL LOW (ref >=60)
GLUCOSE RANDOM: 135 mg/dL (ref 70–179)
GLUCOSE RANDOM: 197 mg/dL — ABNORMAL HIGH (ref 70–179)
POTASSIUM: 3.7 mmol/L (ref 3.4–4.8)
POTASSIUM: 4.1 mmol/L (ref 3.4–4.8)
SODIUM: 133 mmol/L — ABNORMAL LOW (ref 135–145)
SODIUM: 130 mmol/L — ABNORMAL LOW (ref 135–145)

## 2021-12-10 LAB — MAGNESIUM
MAGNESIUM: 3.5 mg/dL — ABNORMAL HIGH (ref 1.6–2.6)
MAGNESIUM: 3.4 mg/dL — ABNORMAL HIGH (ref 1.6–2.6)

## 2021-12-10 LAB — PHOSPHORUS
PHOSPHORUS: 5.4 mg/dL — ABNORMAL HIGH (ref 2.4–5.1)
PHOSPHORUS: 5.3 mg/dL — ABNORMAL HIGH (ref 2.4–5.1)

## 2021-12-10 MED ADMIN — pantoprazole (PROTONIX) EC tablet 20 mg: 20 mg | ORAL | @ 14:00:00

## 2021-12-10 MED ADMIN — furosemide (LASIX) 120 mg in sodium chloride (NS) 0.9 % 50 mL IVPB: 120 mg | INTRAVENOUS | @ 18:00:00

## 2021-12-10 MED ADMIN — Tafamidis 61 mg capsule - **PATIENT SUPPLIED MEDICATION**: 61 mg | ORAL | @ 14:00:00

## 2021-12-10 MED ADMIN — fluticasone furoate-vilanteroL (BREO ELLIPTA) 100-25 mcg/dose inhaler 1 puff: 1 | RESPIRATORY_TRACT | @ 15:00:00

## 2021-12-10 MED ADMIN — montelukast (SINGULAIR) tablet 10 mg: 10 mg | ORAL | @ 03:00:00

## 2021-12-10 MED ADMIN — furosemide (LASIX) 120 mg in sodium chloride (NS) 0.9 % 50 mL IVPB: 120 mg | INTRAVENOUS | @ 23:00:00

## 2021-12-10 MED ADMIN — amiodarone (PACERONE) tablet 100 mg: 100 mg | ORAL | @ 14:00:00

## 2021-12-10 MED ADMIN — oxyCODONE (OxyCONTIN) 12 hr crush resistant ER/CR tablet 10 mg: 10 mg | ORAL | @ 03:00:00 | Stop: 2021-12-20

## 2021-12-10 MED ADMIN — melatonin tablet 3 mg: 3 mg | ORAL | @ 03:00:00

## 2021-12-10 MED ADMIN — oxyCODONE (OxyCONTIN) 12 hr crush resistant ER/CR tablet 10 mg: 10 mg | ORAL | @ 14:00:00 | Stop: 2021-12-20

## 2021-12-10 MED ADMIN — polyethylene glycol (MIRALAX) packet 17 g: 17 g | ORAL | @ 14:00:00 | Stop: 2021-12-10

## 2021-12-10 MED ADMIN — spironolactone (ALDACTONE) tablet 25 mg: 25 mg | ORAL | @ 14:00:00

## 2021-12-10 MED ADMIN — umeclidinium (INCRUSE ELLIPTA) 62.5 mcg/actuation inhaler 1 puff: 1 | RESPIRATORY_TRACT | @ 15:00:00

## 2021-12-10 MED ADMIN — pravastatin (PRAVACHOL) tablet 10 mg: 10 mg | ORAL | @ 14:00:00

## 2021-12-10 MED ADMIN — sodium ferric gluconate (FERRLECIT) 250 mg in sodium chloride (NS) 0.9 % 100 mL IVPB: 250 mg | INTRAVENOUS | @ 16:00:00 | Stop: 2021-12-10

## 2021-12-10 MED ADMIN — sevelamer (RENVELA) tablet 800 mg: 800 mg | ORAL | @ 23:00:00

## 2021-12-10 MED ADMIN — sevelamer (RENVELA) tablet 800 mg: 800 mg | ORAL | @ 18:00:00

## 2021-12-10 MED ADMIN — sodium ferric gluconate (FERRLECIT) 250 mg in sodium chloride (NS) 0.9 % 100 mL IVPB: 250 mg | INTRAVENOUS | @ 03:00:00 | Stop: 2021-12-10

## 2021-12-10 MED ADMIN — metOLazone (ZAROXOLYN) tablet 10 mg: 10 mg | ORAL | @ 14:00:00

## 2021-12-10 MED ADMIN — furosemide (LASIX) 120 mg in sodium chloride (NS) 0.9 % 50 mL IVPB: 120 mg | INTRAVENOUS | @ 10:00:00

## 2021-12-10 NOTE — Unmapped (Signed)
Additional order received and the patient is already on the caseload. Continue with  plan of care.

## 2021-12-10 NOTE — Unmapped (Signed)
Problem: Adult Inpatient Plan of Care  Goal: Plan of Care Review  Outcome: Progressing   Pt is A&O4, SB on tele, On CPAP during night. VSS. Denies pain.family at bedside.   Pt is resting at this time. Will continue to monitor.  Goal: Patient-Specific Goal (Individualized)  Outcome: Progressing  Goal: Absence of Hospital-Acquired Illness or Injury  Outcome: Progressing  Goal: Optimal Comfort and Wellbeing  Outcome: Progressing  Goal: Readiness for Transition of Care  Outcome: Progressing  Goal: Rounds/Family Conference  Outcome: Progressing     Problem: Self-Care Deficit  Goal: Improved Ability to Complete Activities of Daily Living  Outcome: Progressing

## 2021-12-10 NOTE — Unmapped (Signed)
Palliative Care Consult Note    Consultation from Requesting Attending Physician:  Carilyn Goodpasture, MD  Service Requesting Consult:  Cardiology [facility])  Reason for Consult Request from Attending Physician:  Evaluation of Goals of Care / Decision Making  Primary Care Provider:  Jacquiline Doe, MD      Assessment/Plan:      SUMMARY:  This 80 y.o. patient is seriously ill due to [name] in setting of AoCKD and COPD.  Multiple hospital admissions over last several weeks.   P/w fatigue, junctional rhythm and ARF (Cr 4.60).  Palliative care C/s for help w/ GOC discussions.      PMHx: pAF (Eliquis), COPD (home O2), anemia of chronic disease, [name]4, T2DM.    Symptom Assessment and Recommendations:    #Anorexiadue to chronic disease/uremia, : progressive  Loss of appetite over last several weeks.  Always eats breakfast but then tends to pick for the rest of the day.  Team lifted dietary restrictions today.  Encouraged patient/daughter to eat for comfort.  Discussed that this is part of her disease process and appetite would wax/wane.    #Dyspnea due to HF and COPD: 3L O2 at baseline.  Gets SOB w/ activity.  Walks around her home w/o significant problems but generally has to stop and rest b/w activities such as fixing food and dressing.      #Chronic back pain/peripheral neuropathy:  She takes oxycontin 10 mg BID at home.    #Opioid related constipation: uses miralax daily.    Goals of Care and Decision Making:     Decisional capacity at time of visit:  yes    Healthcare Decision Maker if lacks capacity: Name:    HCDM (patient stated preference): [name],[name] - Daughter - [phone]    Advance Directive: no    Code status:   Code Status: Full Code     Prognosis / prognostic understanding:  Months to a year.  Aryiana and her daughter Vivi Martens have a good understanding of illness trajectory.    Current Goals of care:   -see ACP note   -Return home w/ the help of OP palliative care.    -DNR/DNI:       Counseling and Coordination - Practical, Emotional, Spiritual Support Needs:    -introduced palliative care and the services that we provide to seriously ill patients and their families.  -Palliative care visit today included focused interview, active listening, therapeutic use of silence, offering support, sharing empathy and summarization of today's discussion.  -chart review and communication w/ primary team regarding medical updates and plan of care.  Counseled team on logistics of OP/Community palliative care consult.    -Tonye shares that she was feeling well up until several weeks ago when she noted progressive fatigue and anorexia.  Spent more and more time in her chair.  Took her longer to complete tasks,  Needed more frequent rest periods-I knew than my heart was getting weaker.  She lives w/ her daughter and SIL but is independent w/ all ADLs.        Thank you for this consult. Please page  [place] PA-C  [phone])  or Palliative Care [phone]) if there are any questions.       Subjective:     HPI:  Patient was recently discharged on 12/02/2021 for a [name] exacerbation, managed with [name].  She presented to the ED 12/06/2021 for feeling unwell with a decreased appetite and generalized fatigue      Symptom Severity and Assessment:  Pain severity and assessment:  Denies pain at this time.  Uses oxycontin BID at home for chronic pain  Shortness of breath:  Dyspnea w/ moderate activity at home.  Wears O2 at baseline  Nausea/Vomiting:  denies  Constipation:  Chronic, uses miralax daily  Sleep increasing fatigue over last several weeks.  Spending more time napping during the day.  Anxiety: feeling more anxious as her health declines.  Depression denies feeling hopeless or overwhelmingly sad.  She attributes this to her faith in God        Allergies:  Allergies   Allergen Reactions   ??? Nitrofurantoin Anaphylaxis and Other (See Comments)   ??? Lipitor [Atorvastatin] Muscle Pain     SOB, Headache, fatigue, sick on my stomach       Medications:  Scheduled Meds:  ??? amiodarone  100 mg Oral Daily   ??? fluticasone furoate-vilanteroL  1 puff Inhalation Daily (RT)   ??? furosemide  120 mg Intravenous TID   ??? metOLazone  10 mg Oral Daily   ??? montelukast  10 mg Oral Nightly   ??? pantoprazole  20 mg Oral Daily   ??? polyethylene glycol  17 g Oral BID   ??? pravastatin  10 mg Oral Daily   ??? senna  1 tablet Oral Nightly   ??? sevelamer  800 mg Oral 3xd Meals   ??? sodium ferric gluconate IVPB  250 mg Intravenous Q12H [facility]   ??? spironolactone  25 mg Oral Daily   ??? tafamidis  61 mg Oral Daily   ??? umeclidinium  1 puff Inhalation Daily (RT)     Continuous Infusions:  PRN Meds:.acetaminophen, albuterol, melatonin, ondansetron, oxyCODONE, simethicone     Past Medical History:   Diagnosis Date   ??? Acute kidney injury superimposed on chronic kidney disease (CMS-HCC) 10/11/2020   ??? Acute on chronic diastolic (congestive) heart failure (CMS-HCC) 08/23/2020   ??? Arthritis    ??? Calculus of kidney    ??? Calculus of ureter    ??? CHF (congestive heart failure) (CMS-HCC)    ??? Chronic atrial fibrillation (CMS-HCC) 07/20/2019   ??? COPD (chronic obstructive pulmonary disease) (CMS-HCC)    ??? Diabetes (CMS-HCC)    ??? Gangrenous cholecystitis 10/11/2020   ??? GERD (gastroesophageal reflux disease)    ??? Hydronephrosis    ??? Hypertension    ??? Hyponatremia 10/11/2020   ??? Intermediate coronary syndrome (CMS-HCC) 03/13/2014   ??? Lower extremity edema 09/28/2020   ??? Lumbar stenosis    ??? Microscopic hematuria    ??? Nausea alone    ??? Nephrolithiasis 04/17/2016   ??? Neuropathy    ??? Nocturia    ??? Other chronic cystitis    ??? Pulmonary hypertension (CMS-HCC)    ??? Renal colic    ??? Sleep apnea    ??? Unstable angina pectoris (CMS-HCC) 03/13/2014       Past Surgical History:   Procedure Laterality Date   ??? BACK SURGERY  1995   ??? CARPAL TUNNEL RELEASE Left 2014   ??? HYSTERECTOMY  1971   ??? IR INSERT CHOLECYSTOSMY TUBE PERCUTANEOUS  10/02/2020    IR INSERT CHOLECYSTOSMY TUBE PERCUTANEOUS 10/02/2020 Braulio Conte, MD IMG VIR [facility]V [facility]   ??? LUMBAR DISC SURGERY     ??? PR REMOVAL GALLBLADDER N/A 10/06/2020    Procedure: CHOLECYSTECTOMY;  Surgeon: Katherina Mires, MD;  Location: MAIN OR [facility];  Service: Trauma   ??? PR RIGHT HEART CATH O2 SATURATION & CARDIAC OUTPUT N/A 09/30/2020  Procedure: Right Heart Catheterization;  Surgeon: Neal Dy, MD;  Location: [facility] CATH;  Service: Cardiology   ??? PR RIGHT HEART CATH O2 SATURATION & CARDIAC OUTPUT N/A 01/09/2021    Procedure: Right Heart Catheterization;  Surgeon: Lesle Reek, MD;  Location: [facility] CATH;  Service: Cardiology       Social History:  Lives w/ dtr/SIL.  Independents w/ all ADLs.  Loves to watch TV and spend time w/ her daughter.    Family History:    family history includes Cancer in her father; Hypertension in her mother.      Review of Systems:  A 12 system review of systems was negative except as noted in HPI.      Objective:       Function:  50% - Ambulation: Mainly sit/lie / Unable to do any work, extensive disease / Self-Care:Considerable assistance required, Intake: Normal or reduced / Level of Conscious: Full or confusion    Temp:  [36.7 ??C (98.1 ??F)-37.2 ??C (99 ??F)] 36.9 ??C (98.4 ??F)  Heart Rate:  [55-72] 66  SpO2 Pulse:  [51-61] 61  Resp:  [18-20] 18  BP: (109-149)/(41-66) 115/46  SpO2:  [89 %-96 %] 93 %    I/O this shift:  In: 50 [P.O.:50]  Out: 950 [Urine:950]    Physical Exam:  Constitutional: sitting in chair.  Appears comfortable.   Eyes: anicteric sclera, no discharge  ENMT: oral mucosa moist, throat clear, PERRL  Pulm: breathing non-labored on O2.  Lungs clear.   CV: RRR  Abd: soft, non-distended.  +BS  MSK:  strength of upper/lower extremities grossly intact.  Skin: warm and dry  Neuro: cognitive status oriented x3   Psych: mood appropriate to conversation.  Engaging/conversantant.  Good eye contact.    Test Results:  Lab Results   Component Value Date    WBC 8.7 12/10/2021    RBC 3.64 (L) 12/10/2021    HGB 8.1 (L) 12/10/2021    HCT 27.0 (L) 12/10/2021    MCV 74.2 (L) 12/10/2021    MCH 22.1 (L) 12/10/2021    MCHC 29.8 (L) 12/10/2021    RDW 17.1 (H) 12/10/2021    PLT 303 12/10/2021    MPV 8.2 12/10/2021     Lab Results   Component Value Date    NA 133 (L) 12/10/2021    K 3.7 12/10/2021    CL 86 (L) 12/10/2021    CO2 36.0 (H) 12/10/2021    BUN 111 (H) 12/10/2021    CREATININE 3.48 (H) 12/10/2021    GFR >= 60 01/06/2011    GLU 135 12/10/2021    CALCIUM 9.2 12/10/2021    ALBUMIN 3.1 (L) 12/09/2021    PHOS 5.4 (H) 12/10/2021      Lab Results   Component Value Date    ALKPHOS 63 12/09/2021    BILITOT 0.5 12/09/2021    BILIDIR 0.60 (H) 10/11/2020    PROT 7.0 12/09/2021    ALBUMIN 3.1 (L) 12/09/2021    ALT <7 (L) 12/09/2021    AST 10 12/09/2021       Imaging: reviewed in Epic    I personally spent 30 minutes on the floor or unit in direct patient care. The direct patient care time included face-to-face time with the patient, reviewing the patient's chart, communicating with the family and/or other professionals and coordinating care.   Start time - stop time if >60 minutes:    Greater than 50% of this time spent on counseling/coordination of care:  Yes.   See ACP Note from today for additional billable service:  Yes.

## 2021-12-10 NOTE — Unmapped (Cosign Needed)
Cardiology - Team 1 [facility]) Progress Note    Assessment & Plan:   Ms. Crocker is an 80 y/o F w/ a PMHx of [name] (iso cardiac amyloidosis), pAF (s/p DCCV 12/2020 w/ conversion to NSR, on Eliquis), [name] IV, T2DM (A1c 7.0 11/2021), IDA, COPD (on home O2 3.5L), and pHTN who presents for management of fatigue, new junctional rhythm, worsening AKI, troponin elevation, and possible volume overload.    Principal Problem:    AKI (acute kidney injury) (CMS-HCC)  Active Problems:    Spinal stenosis of lumbar region    COPD (chronic obstructive pulmonary disease) (CMS-HCC)    Sleep apnea in adult    Essential hypertension    Type 2 diabetes mellitus with stage 3b chronic kidney disease, without long-term current use of insulin (CMS-HCC)    Anemia in chronic kidney disease    Chronic kidney disease, stage III (moderate) (CMS-HCC)    Pulmonary hypertension (CMS-HCC)    Chronic prescription opiate use    Cardiac amyloidosis (CMS-HCC)  Resolved Problems:    * No resolved hospital problems. *    [name] (LVEF 55%, GIIDD) -- Cardiac Amyloidosis:   She's had issues with hypotension on ARB and BB, as well as atrial arrhythmias. SPEP/UPEP/free light chains without concern. PYP NM SPECT + ??for ATTR amyloid, grade 3 uptake. 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 Recently hospitalized and discharged on 12/6 for [name] exacerbation managed with [name] and metolazone. BNP is elevated to 854, which is elevated from prior.  On 11/29/2021 BNP was 622, on 11/14/2021, BNP was 301.  Last echo on 03/2021 showed LVEF greater than 55% and grade 2 diastolic dysfunction.  Dry weight is 214 pounds.  Interestingly, as of 12/03/2021, patient was at her dry weight of 213 pounds.  Despite lack of overt volume overload signs and symptoms, given elevated BNP, subjective fatigue, and worsening AKI, feel that patient does need continued diuresis. Elevate troponin trended down. Variabilities in troponin is expected with amyloidosis. Given that patient did not have any ACS symptoms, will stop trending. Echo showed hyperdynamic LV, LVEF 65-70%, LV wall weakness is severely increased. Grade 3 diastolic dysfunction. RV dilated. Responsive to diuretics, had net negative 1.6 output as of 12/14.   -Continue spironolactone   -Holding torsemide given that patient has not responded to her outpatient management   - Continue Lasix 120TID+Metolazone 10 daily  -Continued Tafamidis 61 mg daily   -Strict I/Os; keep Foley catheter in place for more accurate I/Os  -Daily weight   -Palliative care consulted, meeting with patient and family today   - Discontinued low sodium diet, patient now on general diet    Hematochizia -- Chronic Microcytic Anemia -- Iron Deficiency Anemia:  Two bloody bowel movements noted night of 12/12. Hemoglobin 12/13 down to 7.3 from baseline of around 8.8. Patient received IV iron 12/14, hemoglobin 12/14 up to 8.1. Blood per rectum thought most likely to be the result of increased dosing of Eliquis (5 mg up from home dose of 2.5 mg BID). Chronically low hemoglobin likely related to [name] 4, AoCD, and iron deficiency anemia. Has received IV iron in the past. Iron at 12 and saturation at less than 4%.  - Continue holding Eliquis  - Monitor bowel movements for additional signs of GI bleeding  - Trend CBC  - If not having bloody bowel movements, given that Hg is stable, can consider restarting Eliquis     Paroxysmal AF -- Atrial Fibrillation -- New Junctional  Rhythm:  Paroxysmal atrial fibrillation, most recently has remained in normal sinus rhythm but as of this hospitalization is in a junctional rhythm.  On amiodarone and apixaban at home. CHA2DS2-VASc score = 5 (1-gender, 2-Age, 1-DM, 1-HTN).  She did have a cardioversion on 01/10/2021 and was in normal sinus rhythm afterwards. EKG showed sinus rhythm with 1st degree AV block and low voltage Q waves.   -Eliquis 5 mg BID discontinued due to blood per rectum  -Amiodarone reduced dose 100 mg daily 2/2 to bothersome tremor as a side effect     AKI on [name] IV:  GFR 10 on admission, acutely worsened.  Creatinine 4.60, up from 2.5 on 12/02/2021.  BUN elevated to 87, up from 67.  BUN/creatinine ratio is 20, suggesting possible prerenal/cardiorenal etiology, although because of acute rise given stabilization with discharge from hospital 4 days ago is unclear.  Patient is producing urine, although decreased from prior.  Has a pure wick in place. Renal US unremarkable. Calculate Fena at 1.6%, saying intrinsic damage, but most likely it is pre-renal AKi on top of ongoing [name]. Creatinine has improved from 3.72 to 3.48. Phosphorus today up to 5.4.   - Avoid nephrotoxic agents  - Start sevelamer 800 mg TID PO with meals for hyperphosphatemia  -Trend BMP, Phos  - Holding gabapentin given the AKI     T2DM (HbA1c 7.0%):  Patient is reporting increased blood sugar readings at home - was previously on metformin in 2021 but this was discontinued given normal A1c.  Hemoglobin A1c is newly elevated to 7.0 from 5.7 previously, last 05/2020 in the system.  - SSI  - Restart metformin on discharge  - Continue Statin      Group 2 pHTN:  Followed by Dr. [name], [facility] pulmonology, thought to be secondary to either [name] or COPD.  - ctm    COPD -- Chronic Hypoxemic Respiratory Failure:   On 3L at home at baseline. No evidence of exacerbation at this time.  Arrived to floor on 4 L nasal cannula. Now (12/14) down to 3.5 L Finger, breathing comfortably with O2 sats >92%.    - Home nebs  - Albuterol as needed  - IS    Daily Checklist:  Diet: Regular Diet  DVT PPx: Full anticoagulation   Electrolytes: Replete Potassium to >/=4 and Magnesium to >/=2  Code Status: Full Code  Dispo: TBD    Team Contact Information:   Primary Team: Cardiology - Team 1 (MEDC1)  Primary Resident: Yehuda Savannah, MD  Resident's Pager: [phone] (Cardiology Team 1 Intern)    Interval History:   Patient was resting comfortably in bed this morning with daughter by her bedside. She noted she was drowsy still but otherwise well. She expressed concern about her continued low appetite and desire to switch from her low sodium diet to be allowed food that is more appetizing to her. She was breathing comfortably on 3.5 L of oxygen by [place]. She denied shortness of breath and chest pain. She has no BLE edema but continues to have JVD to just below her earlobe.  .All other systems were reviewed and are negative except as noted in the HPI    Objective:     Temp:  [36.7 ??C (98.1 ??F)-37.2 ??C (99 ??F)] 36.9 ??C (98.4 ??F)  Heart Rate:  [55-72] 66  SpO2 Pulse:  [51-61] 61  Resp:  [18-20] 18  BP: (109-149)/(41-53) 115/46  SpO2:  [89 %-96 %] 93 %,     Intake/Output Summary (  Last 24 hours) at 12/10/2021 1141  Last data filed at 12/10/2021 0947  Gross per 24 hour   Intake 529 ml   Output 3225 ml   Net -2696 ml   , I/O this shift:  In: 50 [P.O.:50]  Out: 950 [Urine:950],  , O2 Flow Rate (L/min):  [3.5 L/min] 3.5 L/min, Body mass index is 34.12 kg/m??.,   Wt Readings from Last 3 Encounters:   12/09/21 95.9 kg (211 lb 6.7 oz)   12/03/21 96.6 kg (213 lb)   12/02/21 100.3 kg (221 lb 1.9 oz)     Gen: in NAD, answers questions appropriately  Eyes: sclera anicteric, EOMI  HENT: atraumatic, MMM, OP w/o erythema or exudate   Heart: RRR, S1, S2, no M/R/G, no chest wall tenderness, JVD present below the earlobe   Lungs: CTAB, no crackles or wheezes, no use of accessory muscles  Abdomen: Normoactive bowel sounds, soft, NTND, no rebound/guarding  Extremities: no clubbing, cyanosis, or edema in the BLEs  Psych: Alert, oriented, appropriate mood and affect    Labs/Studies: Labs and Studies from the last 24hrs per EMR and Reviewed    Alberteen Sam, MS3    I attest that I have reviewed the student note and that the components of the history of the present illness, the physical exam, and the assessment and plan documented were performed by me or were performed in my presence by the student where I verified the documentation and performed (or re-performed) the exam and medical decision making.     Yehuda Savannah, MD  PGY1, Internal Medicine

## 2021-12-11 LAB — BASIC METABOLIC PANEL
ANION GAP: 9 mmol/L (ref 5–14)
ANION GAP: 9 mmol/L (ref 5–14)
BLOOD UREA NITROGEN: 104 mg/dL — ABNORMAL HIGH (ref 9–23)
BLOOD UREA NITROGEN: 107 mg/dL — ABNORMAL HIGH (ref 9–23)
BUN / CREAT RATIO: 26
BUN / CREAT RATIO: 25
CALCIUM: 9.3 mg/dL (ref 8.7–10.4)
CALCIUM: 9.2 mg/dL (ref 8.7–10.4)
CHLORIDE: 87 mmol/L — ABNORMAL LOW (ref 98–107)
CHLORIDE: 83 mmol/L — ABNORMAL LOW (ref 98–107)
CO2: 36 mmol/L — ABNORMAL HIGH (ref 20.0–31.0)
CO2: 37 mmol/L — ABNORMAL HIGH (ref 20.0–31.0)
CREATININE: 3.96 mg/dL — ABNORMAL HIGH
CREATININE: 4.32 mg/dL — ABNORMAL HIGH
EGFR CKD-EPI (2021) FEMALE: 11 mL/min/{1.73_m2} — ABNORMAL LOW (ref >=60)
EGFR CKD-EPI (2021) FEMALE: 10 mL/min/{1.73_m2} — ABNORMAL LOW (ref >=60)
GLUCOSE RANDOM: 126 mg/dL (ref 70–179)
GLUCOSE RANDOM: 202 mg/dL — ABNORMAL HIGH (ref 70–179)
POTASSIUM: 3.7 mmol/L (ref 3.4–4.8)
POTASSIUM: 4 mmol/L (ref 3.4–4.8)
SODIUM: 132 mmol/L — ABNORMAL LOW (ref 135–145)
SODIUM: 129 mmol/L — ABNORMAL LOW (ref 135–145)

## 2021-12-11 LAB — CBC
HEMATOCRIT: 28.5 % — ABNORMAL LOW (ref 34.0–44.0)
HEMOGLOBIN: 8.5 g/dL — ABNORMAL LOW (ref 11.3–14.9)
MEAN CORPUSCULAR HEMOGLOBIN CONC: 29.9 g/dL — ABNORMAL LOW (ref 32.0–36.0)
MEAN CORPUSCULAR HEMOGLOBIN: 22.4 pg — ABNORMAL LOW (ref 25.9–32.4)
MEAN CORPUSCULAR VOLUME: 75 fL — ABNORMAL LOW (ref 77.6–95.7)
MEAN PLATELET VOLUME: 8.2 fL (ref 6.8–10.7)
PLATELET COUNT: 321 10*9/L (ref 150–450)
RED BLOOD CELL COUNT: 3.8 10*12/L — ABNORMAL LOW (ref 3.95–5.13)
RED CELL DISTRIBUTION WIDTH: 17.4 % — ABNORMAL HIGH (ref 12.2–15.2)
WBC ADJUSTED: 8.6 10*9/L (ref 3.6–11.2)

## 2021-12-11 LAB — MAGNESIUM
MAGNESIUM: 3.4 mg/dL — ABNORMAL HIGH (ref 1.6–2.6)
MAGNESIUM: 3.3 mg/dL — ABNORMAL HIGH (ref 1.6–2.6)

## 2021-12-11 LAB — PHOSPHORUS
PHOSPHORUS: 5.8 mg/dL — ABNORMAL HIGH (ref 2.4–5.1)
PHOSPHORUS: 6.4 mg/dL — ABNORMAL HIGH (ref 2.4–5.1)

## 2021-12-11 MED ADMIN — metOLazone (ZAROXOLYN) tablet 10 mg: 10 mg | ORAL | @ 14:00:00 | Stop: 2021-12-11

## 2021-12-11 MED ADMIN — fluticasone furoate-vilanteroL (BREO ELLIPTA) 100-25 mcg/dose inhaler 1 puff: 1 | RESPIRATORY_TRACT | @ 14:00:00

## 2021-12-11 MED ADMIN — sevelamer (RENVELA) tablet 800 mg: 800 mg | ORAL

## 2021-12-11 MED ADMIN — furosemide (LASIX) 120 mg in sodium chloride (NS) 0.9 % 50 mL IVPB: 120 mg | INTRAVENOUS | @ 11:00:00 | Stop: 2021-12-11

## 2021-12-11 MED ADMIN — umeclidinium (INCRUSE ELLIPTA) 62.5 mcg/actuation inhaler 1 puff: 1 | RESPIRATORY_TRACT | @ 14:00:00

## 2021-12-11 MED ADMIN — pantoprazole (PROTONIX) EC tablet 20 mg: 20 mg | ORAL | @ 14:00:00

## 2021-12-11 MED ADMIN — montelukast (SINGULAIR) tablet 10 mg: 10 mg | ORAL | @ 03:00:00

## 2021-12-11 MED ADMIN — oxyCODONE (OxyCONTIN) 12 hr crush resistant ER/CR tablet 10 mg: 10 mg | ORAL | @ 14:00:00 | Stop: 2021-12-20

## 2021-12-11 MED ADMIN — Tafamidis 61 mg capsule - **PATIENT SUPPLIED MEDICATION**: 61 mg | ORAL | @ 14:00:00

## 2021-12-11 MED ADMIN — oxyCODONE (OxyCONTIN) 12 hr crush resistant ER/CR tablet 10 mg: 10 mg | ORAL | @ 03:00:00 | Stop: 2021-12-20

## 2021-12-11 MED ADMIN — amiodarone (PACERONE) tablet 100 mg: 100 mg | ORAL | @ 14:00:00

## 2021-12-11 MED ADMIN — pravastatin (PRAVACHOL) tablet 10 mg: 10 mg | ORAL | @ 14:00:00

## 2021-12-11 MED ADMIN — polyethylene glycol (MIRALAX) packet 17 g: 17 g | ORAL | @ 14:00:00

## 2021-12-11 MED ADMIN — spironolactone (ALDACTONE) tablet 25 mg: 25 mg | ORAL | @ 14:00:00

## 2021-12-11 MED ADMIN — sevelamer (RENVELA) tablet 800 mg: 800 mg | ORAL | @ 14:00:00

## 2021-12-11 MED ADMIN — ondansetron (ZOFRAN) tablet 4 mg: 4 mg | ORAL | @ 03:00:00

## 2021-12-11 NOTE — Unmapped (Signed)
Patient remains alert and oriented. Follows commands. Working with PT/OT. Spo2 holding via 3L nasal cannula. Sinus rhythm. Minimal appetite, patient taken off sodium diet restriction. No bowel movement this shift. Foley catheter present and draining adequately with pink tinged urine noted. Patient receiving furosemide BID. Patient family at bedside and updated on patient status and plan of care. Palliative spoke with patient family at length during shift and patient was made DNR/DNI. Vital signs stable.  Problem: Adult Inpatient Plan of Care  Goal: Plan of Care Review  Outcome: Progressing  Goal: Patient-Specific Goal (Individualized)  Outcome: Progressing  Goal: Absence of Hospital-Acquired Illness or Injury  Outcome: Progressing  Intervention: Identify and Manage Fall Risk  Recent Flowsheet Documentation  Taken 12/10/2021 0800 by Molli Knock, RN  Safety Interventions:   aspiration precautions   fall reduction program maintained   family at bedside   infection management   lighting adjusted for tasks/safety   low bed   nonskid shoes/slippers when out of bed  Intervention: Prevent Skin Injury  Recent Flowsheet Documentation  Taken 12/10/2021 0800 by Molli Knock, RN  Skin Protection:   adhesive use limited   incontinence pads utilized   skin-to-device areas padded   skin-to-skin areas padded   tubing/devices free from skin contact  Intervention: Prevent and Manage VTE (Venous Thromboembolism) Risk  Recent Flowsheet Documentation  Taken 12/10/2021 0800 by Molli Knock, RN  Activity Management:   activity adjusted per tolerance   activity encouraged  Intervention: Prevent Infection  Recent Flowsheet Documentation  Taken 12/10/2021 0800 by Molli Knock, RN  Infection Prevention: hand hygiene promoted  Goal: Optimal Comfort and Wellbeing  Outcome: Progressing  Goal: Readiness for Transition of Care  Outcome: Progressing  Goal: Rounds/Family Conference  Outcome: Progressing     Problem: Self-Care Deficit  Goal: Improved Ability to Complete Activities of Daily Living  Outcome: Progressing     Problem: Skin Injury Risk Increased  Goal: Skin Health and Integrity  Outcome: Progressing  Intervention: Optimize Skin Protection  Recent Flowsheet Documentation  Taken 12/10/2021 0800 by Molli Knock, RN  Pressure Reduction Techniques:   frequent weight shift encouraged   pressure points protected   weight shift assistance provided  Pressure Reduction Devices:   positioning supports utilized   pressure-redistributing mattress utilized  Skin Protection:   adhesive use limited   incontinence pads utilized   skin-to-device areas padded   skin-to-skin areas padded   tubing/devices free from skin contact

## 2021-12-11 NOTE — Unmapped (Signed)
Problem: Adult Inpatient Plan of Care  Goal: Plan of Care Review  Outcome: Progressing  Goal: Patient-Specific Goal (Individualized)  Outcome: Progressing  Goal: Absence of Hospital-Acquired Illness or Injury  Outcome: Progressing  Intervention: Identify and Manage Fall Risk  Recent Flowsheet Documentation  Taken 12/11/2021 0200 by Fritzi Mandes, RN  Safety Interventions:   bed alarm   bleeding precautions   commode/urinal/bedpan at bedside   fall reduction program maintained   family at bedside   lighting adjusted for tasks/safety   low bed   nonskid shoes/slippers when out of bed  Taken 12/11/2021 0138 by Fritzi Mandes, RN  Safety Interventions:   bed alarm   bleeding precautions   commode/urinal/bedpan at bedside   fall reduction program maintained   family at bedside   lighting adjusted for tasks/safety   low bed   nonskid shoes/slippers when out of bed  Intervention: Prevent Skin Injury  Recent Flowsheet Documentation  Taken 12/11/2021 0138 by Fritzi Mandes, RN  Skin Protection:   adhesive use limited   incontinence pads utilized  Intervention: Prevent and Manage VTE (Venous Thromboembolism) Risk  Recent Flowsheet Documentation  Taken 12/11/2021 0138 by Fritzi Mandes, RN  Activity Management: activity adjusted per tolerance  VTE Prevention/Management: bleeding precautions maintained  Goal: Optimal Comfort and Wellbeing  Outcome: Progressing  Goal: Readiness for Transition of Care  Outcome: Progressing  Goal: Rounds/Family Conference  Outcome: Progressing     Problem: Self-Care Deficit  Goal: Improved Ability to Complete Activities of Daily Living  Outcome: Progressing     Problem: Skin Injury Risk Increased  Goal: Skin Health and Integrity  Outcome: Progressing  Intervention: Optimize Skin Protection  Recent Flowsheet Documentation  Taken 12/11/2021 0138 by Fritzi Mandes, RN  Pressure Reduction Techniques: frequent weight shift encouraged  Pressure Reduction Devices: pressure-redistributing mattress utilized  Skin Protection:   adhesive use limited   incontinence pads utilized     Problem: COPD (Chronic Obstructive Pulmonary Disease) Comorbidity  Goal: Maintenance of COPD Symptom Control  Outcome: Progressing     Problem: Diabetes Comorbidity  Goal: Blood Glucose Level Within Targeted Range  Outcome: Progressing     Problem: Heart Failure Comorbidity  Goal: Maintenance of Heart Failure Symptom Control  Outcome: Progressing     Problem: Hypertension Comorbidity  Goal: Blood Pressure in Desired Range  Outcome: Progressing     Problem: Obstructive Sleep Apnea Risk or Actual Comorbidity Management  Goal: Unobstructed Breathing During Sleep  Outcome: Progressing     Problem: Pain Chronic (Persistent) (Comorbidity Management)  Goal: Acceptable Pain Control and Functional Ability  Outcome: Progressing     VSS, Patient alert and oriented x4. Follows commands. Spo2 holding via 4L nasal cannula. Sinus rhythm. Minimal appetite, patient taken off sodium diet restriction during previous shift. No bowel movement this shift. Foley catheter in place. Patient receiving furosemide BID. Patient family at bedside and updated on patient status and plan of care. Palliative spoke with patient family at length during shift and patient was made DNR/DNI during previous shift. Call bell within reach, bed alarm on.

## 2021-12-11 NOTE — Unmapped (Signed)
ADVANCE CARE PLANNING NOTE    Discussion Date:  December 10, 2021    Patient has decisional capacity:  Yes    Patient has selected a Health Care Decision-Maker if loses capacity: Yes    Health Care Decision Maker as of 12/10/2021    HCDM (patient stated preference): Devota Pace - Daughter - [phone]    Discussion Participants:  Patient  [name] (HCPOA)   [place] PA-palliative care    Communication of Medical Status/Prognosis:   Rhanda shares that she was feeling well up until several weeks ago when she noted progressive fatigue and anorexia.  Spent more and more time in her chair.  Took her longer to complete tasks,  Needed more frequent rest periods-I knew than my heart was getting weaker.    She was told yesterday by the doctors that her heart/kidney disease was getter worse and that she was getting to a point where medications were not going to work as well for her.  Today, we talked at length (w/ Ciniqua) about the trajectory of advanced HF/kidney failure.  She understands that her trajectory is uncertain but she will have periods of stability followed by exacerbation which will require hospitalization.  Discussed that with each hospitalization she will not return to her previous baseline and become gradually weaker and more frail.  Prognosis is uncertain but likely months to maybe a year.    Communication of Treatment Goals/Options:   Viera shares that her primary goals at this time are life prolongation w/ the best QoL possible.  She wants more time to spend w/ her daughter.  She is willing to go through further hospitalizations in order to have more time.  She is willing to have further conversations has her health fails and does foresee a time when she will not want to come back to the hospital.    Discussed general options at this time. First, would be to continue current trajectory of care w/ the addition of OP palliative care for support and symptom management to help prolong her time out of the hospital.  She would continue to come to the hospital as necessary.      Overtime, hospitalizations will become more frequent and medications will become less effective.  During this time, she will become more frail and less able to care for herself independently.  At some point, she may find that hospitalization will offer no true benefit and treatments will become more burdensome.    Talked about stepping away from recurrent hospitalizations to focus on QoL over quantity of life knowing that this may hasten her death.  Discussed that focus would be on comfort and symptom management w/ the help of hospice.  Discussed the services hospice would provide in the home setting.    Discussed code status in the setting of her serious life-limiting co-morbidities.  Resuscitation in this situation is rarely successful in bringing patients back to their previous level of function or QoL.  They often pass in the hospital/ICU on life-prolonging machines.  Adlene shares that she would not want to die in an ICU and if her heart were to stop she would see that as a sign from God that he was calling me home.    Treatment Decisions:   -Return home w/ the help of OP palliative care.  She prefers palliative care to come to her home but would be willing to see them at [facility] or virtually.  She would like to return to the hospital when necessary.      -  DNR/DNI:             I spent 45 minutes providing voluntary advance care planning services for this patient.

## 2021-12-11 NOTE — Unmapped (Signed)
Palliative Medicine Service Sign Off    Palliative Care has addressed goals of care with the patient and family. Those goals remain clear and have been documented in prior note. The patient does not currently have any symptoms that require active management by the [facility] team. Patient and family feel adequately supported and have not requested additional psychosocial support.     Palliative Care will sign off at this time. Thank you for allowing [place] to participate in the care of this patient.  Should there be a change in the status of the patient or if new symptoms arise, please do not hesitate to re-consult the team.     NO BILL TODAY

## 2021-12-11 NOTE — Unmapped (Signed)
Cardiology - Team 1 [facility]) Progress Note    Assessment & Plan:   Barbara Huber is an 80 y/o F w/ a PMHx of [name] (iso cardiac amyloidosis), pAF (s/p DCCV 12/2020 w/ conversion to NSR, on Eliquis), [name] IV, T2DM (A1c 7.0 11/2021), IDA, COPD (on home O2 3.5L), and pHTN who presents for management of fatigue, new junctional rhythm, worsening AKI, troponin elevation, and possible volume overload.    Principal Problem:    AKI (acute kidney injury) (CMS-HCC)  Active Problems:    Spinal stenosis of lumbar region    COPD (chronic obstructive pulmonary disease) (CMS-HCC)    Sleep apnea in adult    Essential hypertension    Type 2 diabetes mellitus with stage 3b chronic kidney disease, without long-term current use of insulin (CMS-HCC)    Anemia in chronic kidney disease    Chronic kidney disease, stage III (moderate) (CMS-HCC)    Pulmonary hypertension (CMS-HCC)    Chronic prescription opiate use    Cardiac amyloidosis (CMS-HCC)  Resolved Problems:    * No resolved hospital problems. *    [name] (LVEF 55%, GIIDD) -- Cardiac Amyloidosis:   She's had issues with hypotension on ARB and BB, as well as atrial arrhythmias. SPEP/UPEP/free light chains without concern. PYP NM SPECT + ??for ATTR amyloid, grade 3 uptake. 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 Recently hospitalized and discharged on 12/6 for [name] exacerbation managed with [name] and metolazone. BNP is elevated to 854, which is elevated from prior.  On 11/29/2021 BNP was 622, on 11/14/2021, BNP was 301.  Last echo on 03/2021 showed LVEF greater than 55% and grade 2 diastolic dysfunction.  Dry weight is 214 pounds.  Interestingly, as of 12/03/2021, patient was at her dry weight of 213 pounds. Suspect patient has lost weight in recent weeks and that true dry weight is now lower.  Despite lack of overt volume overload signs and symptoms, given elevated BNP, subjective fatigue, and worsening AKI, feel that patient does need continued diuresis. Elevate troponin trended down. Variabilities in troponin is expected with amyloidosis. Given that patient did not have any ACS symptoms, will stop trending. Echo showed hyperdynamic LV, LVEF 65-70%, LV wall weakness is severely increased. Grade 3 diastolic dysfunction. RV dilated. Responsive to diuretics, had net negative 1.8 L output as of 12/15.    - Continue spironolactone   - Plan to restart torsemide tomorrow, discharge tomorrow if kidney function stable today  - Stop Lasix given 12/15 increase in Cr  - Metolazone 10 daily  - Continued Tafamidis 61 mg daily   - Strict I/Os; remove Foley catheter today 12/15  - Daily weight   - Palliative care consulted, met with patient and family yesterday, placing referral for outpatient follow-up  - Discontinued low sodium diet, patient now on general diet    Hematochizia -- Chronic Microcytic Anemia -- Iron Deficiency Anemia:  Two bloody bowel movements noted night of 12/12. Hemoglobin 12/13 down to 7.3 from baseline of around 8.8. Patient received IV iron 12/13, hemoglobin 12/15 up to 8.5. Blood per rectum thought most likely to be the result of increased dosing of Eliquis (5 mg up from home dose of 2.5 mg BID). Chronically low hemoglobin likely related to [name] 4, AoCD, and iron deficiency anemia. Has received IV iron in the past. Iron at 12 and saturation at less than 4%.  - Continue holding Eliquis; defer restarting Eliquis to outpatient setting  - Monitor bowel movements for additional signs  of GI bleeding  - Trend CBC    Paroxysmal AF -- Atrial Fibrillation -- New Junctional Rhythm:  Paroxysmal atrial fibrillation, most recently has remained in normal sinus rhythm but as of this hospitalization is in a junctional rhythm.  On amiodarone and apixaban at home. CHA2DS2-VASc score = 5 (1-gender, 2-Age, 1-DM, 1-HTN).  She did have a cardioversion on 01/10/2021 and was in normal sinus rhythm afterwards. EKG showed sinus rhythm with 1st degree AV block and low voltage Q waves.   -Eliquis 5 mg BID discontinued due to blood per rectum  -Amiodarone reduced dose 100 mg daily 2/2 to bothersome tremor as a side effect     AKI on [name] IV:  GFR 10 on admission, acutely worsened.  Creatinine 4.60, up from 2.5 on 12/02/2021.  BUN elevated to 87, up from 67.  BUN/creatinine ratio is 20, suggesting possible prerenal/cardiorenal etiology, although because of acute rise given stabilization with discharge from hospital 4 days ago is unclear.  Patient is producing urine, although decreased from prior.  Has a pure wick in place. Renal US unremarkable. Calculate Fena at 1.6%, saying intrinsic damage, but most likely it is pre-renal AKi on top of ongoing [name]. Creatinine increased today from 3.48 to 3.96. Phosphorus today up to 5.8 from 5.4. Suspect from overdiuresis.   - Avoid nephrotoxic agents  - Continue sevelamer 800 mg TID PO with meals for hyperphosphatemia  - Discontinue IV Lasix, switch to oral torsemide tomorrow  -Trend BMP, Phos  - Holding gabapentin given the AKI     T2DM (HbA1c 7.0%):  Patient is reporting increased blood sugar readings at home - was previously on metformin in 2021 but this was discontinued given normal A1c.  Hemoglobin A1c is newly elevated to 7.0 from 5.7 previously, last 05/2020 in the system.  - SSI  - Do not restart metformin on discharge, given kidney function  - Continue Statin      Group 2 pHTN:  Followed by Dr. [name], [facility] pulmonology, thought to be secondary to either [name] or COPD.  - ctm    COPD -- Chronic Hypoxemic Respiratory Failure:   On 3L at home at baseline. No evidence of exacerbation at this time.  Arrived to floor on 4 L nasal cannula. Now (12/15) down to 3.5 L [place], breathing comfortably with O2 sats >92%.    - Home nebs  - Albuterol as needed  - IS    Daily Checklist:  Diet: Regular Diet  DVT PPx: Full anticoagulation   Electrolytes: Replete Potassium to >/=4 and Magnesium to >/=2  Code Status: DNR and DNI  Dispo: TBD    Team Contact Information: Primary Team: Cardiology - Team 1 (MEDC1)  Primary Resident: Yehuda Savannah, MD  Resident's Pager: [phone] (Cardiology Team 1 Intern)    Interval History:   Patient was resting comfortably in bed this morning with daughter by her bedside. She noted she was tired from being transferred to floor room overnight but otherwise fine. She was breathing comfortably on 3.5 L of oxygen by [place], had been on 3.0 L at points last night but reports she desatted to 89% SpO2 so increased back to 3.5 L O2. She reports one small dark bowel movement last night, with no obvious frank blood.  .All other systems were reviewed and are negative except as noted in the HPI    Objective:     Temp:  [36.7 ??C (98.1 ??F)-37.3 ??C (99.2 ??F)] 36.7 ??C (98.1 ??F)  Heart Rate:  [54-69]  59  SpO2 Pulse:  [60-64] 64  Resp:  [16-18] 16  BP: (103-118)/(36-63) 118/63  SpO2:  [91 %-96 %] 91 %,     Intake/Output Summary (Last 24 hours) at 12/11/2021 1120  Last data filed at 12/11/2021 0500  Gross per 24 hour   Intake 290 ml   Output 1200 ml   Net -910 ml   , No intake/output data recorded.,  , O2 Device: BiPAP  O2 Flow Rate (L/min):  [3 L/min-4 L/min] 3 L/min, Body mass index is 34.12 kg/m??.,   Wt Readings from Last 3 Encounters:   12/09/21 95.9 kg (211 lb 6.7 oz)   12/03/21 96.6 kg (213 lb)   12/02/21 100.3 kg (221 lb 1.9 oz)     Gen: in NAD, answers questions appropriately  Eyes: sclera anicteric, EOMI  HENT: atraumatic, MMM, OP w/o erythema or exudate   Heart: RRR, S1, S2, no M/R/G, no chest wall tenderness, JVD present barely above clavicle  Lungs: CTAB, no crackles or wheezes, no use of accessory muscles  Extremities: no clubbing, cyanosis, or edema in the BLEs  Psych: Alert, oriented, appropriate mood and affect    Labs/Studies: Labs and Studies from the last 24hrs per EMR and Reviewed    Alberteen Sam, MS3    I attest that I have reviewed the student note and that the components of the history of the present illness, the physical exam, and the assessment and plan documented were performed by me or were performed in my presence by the student where I verified the documentation and performed (or re-performed) the exam and medical decision making.    Kathrin Ruddy, MD  [facility] Internal Medicine, PGY3  Pager: [phone]

## 2021-12-12 LAB — BASIC METABOLIC PANEL
ANION GAP: 8 mmol/L (ref 5–14)
BLOOD UREA NITROGEN: 104 mg/dL — ABNORMAL HIGH (ref 9–23)
BUN / CREAT RATIO: 23
CALCIUM: 9.2 mg/dL (ref 8.7–10.4)
CHLORIDE: 86 mmol/L — ABNORMAL LOW (ref 98–107)
CO2: 34 mmol/L — ABNORMAL HIGH (ref 20.0–31.0)
CREATININE: 4.61 mg/dL — ABNORMAL HIGH
EGFR CKD-EPI (2021) FEMALE: 9 mL/min/{1.73_m2} — ABNORMAL LOW (ref >=60)
GLUCOSE RANDOM: 121 mg/dL (ref 70–179)
POTASSIUM: 3.8 mmol/L (ref 3.4–4.8)
SODIUM: 128 mmol/L — ABNORMAL LOW (ref 135–145)

## 2021-12-12 LAB — CBC
HEMATOCRIT: 27 % — ABNORMAL LOW (ref 34.0–44.0)
HEMOGLOBIN: 8.3 g/dL — ABNORMAL LOW (ref 11.3–14.9)
MEAN CORPUSCULAR HEMOGLOBIN CONC: 30.6 g/dL — ABNORMAL LOW (ref 32.0–36.0)
MEAN CORPUSCULAR HEMOGLOBIN: 23.1 pg — ABNORMAL LOW (ref 25.9–32.4)
MEAN CORPUSCULAR VOLUME: 75.5 fL — ABNORMAL LOW (ref 77.6–95.7)
MEAN PLATELET VOLUME: 8 fL (ref 6.8–10.7)
PLATELET COUNT: 316 10*9/L (ref 150–450)
RED BLOOD CELL COUNT: 3.58 10*12/L — ABNORMAL LOW (ref 3.95–5.13)
RED CELL DISTRIBUTION WIDTH: 17.6 % — ABNORMAL HIGH (ref 12.2–15.2)
WBC ADJUSTED: 8.5 10*9/L (ref 3.6–11.2)

## 2021-12-12 LAB — PHOSPHORUS: PHOSPHORUS: 6 mg/dL — ABNORMAL HIGH (ref 2.4–5.1)

## 2021-12-12 LAB — MAGNESIUM: MAGNESIUM: 3.3 mg/dL — ABNORMAL HIGH (ref 1.6–2.6)

## 2021-12-12 MED ADMIN — polyethylene glycol (MIRALAX) packet 17 g: 17 g | ORAL | @ 02:00:00

## 2021-12-12 MED ADMIN — polyethylene glycol (MIRALAX) packet 17 g: 17 g | ORAL | @ 14:00:00

## 2021-12-12 MED ADMIN — spironolactone (ALDACTONE) tablet 25 mg: 25 mg | ORAL | @ 14:00:00

## 2021-12-12 MED ADMIN — montelukast (SINGULAIR) tablet 10 mg: 10 mg | ORAL | @ 02:00:00

## 2021-12-12 MED ADMIN — fluticasone furoate-vilanteroL (BREO ELLIPTA) 100-25 mcg/dose inhaler 1 puff: 1 | RESPIRATORY_TRACT | @ 14:00:00

## 2021-12-12 MED ADMIN — sevelamer (RENVELA) tablet 1,600 mg: 1600 mg | ORAL | @ 14:00:00

## 2021-12-12 MED ADMIN — sevelamer (RENVELA) tablet 1,600 mg: 1600 mg | ORAL | @ 23:00:00

## 2021-12-12 MED ADMIN — torsemide (DEMADEX) tablet 80 mg: 80 mg | ORAL | @ 14:00:00 | Stop: 2021-12-12

## 2021-12-12 MED ADMIN — pantoprazole (PROTONIX) EC tablet 20 mg: 20 mg | ORAL | @ 14:00:00

## 2021-12-12 MED ADMIN — amiodarone (PACERONE) tablet 100 mg: 100 mg | ORAL | @ 14:00:00

## 2021-12-12 MED ADMIN — senna (SENOKOT) tablet 1 tablet: 1 | ORAL | @ 02:00:00

## 2021-12-12 MED ADMIN — umeclidinium (INCRUSE ELLIPTA) 62.5 mcg/actuation inhaler 1 puff: 1 | RESPIRATORY_TRACT | @ 14:00:00

## 2021-12-12 MED ADMIN — pravastatin (PRAVACHOL) tablet 10 mg: 10 mg | ORAL | @ 14:00:00

## 2021-12-12 MED ADMIN — acetaminophen (TYLENOL) tablet 650 mg: 650 mg | ORAL | @ 03:00:00

## 2021-12-12 MED ADMIN — oxyCODONE (OxyCONTIN) 12 hr crush resistant ER/CR tablet 10 mg: 10 mg | ORAL | @ 02:00:00 | Stop: 2021-12-20

## 2021-12-12 MED ADMIN — Tafamidis 61 mg capsule - **PATIENT SUPPLIED MEDICATION**: 61 mg | ORAL | @ 14:00:00

## 2021-12-12 NOTE — Unmapped (Cosign Needed)
Cardiology - Team 1 [facility]) Progress Note    Assessment & Plan:   Ms. Bin is an 80 y/o F w/ a PMHx of [name] (iso cardiac amyloidosis), pAF (s/p DCCV 12/2020 w/ conversion to NSR, on Eliquis), [name] IV, T2DM (A1c 7.0 11/2021), IDA, COPD (on home O2 3.5L), and pHTN who presents for management of fatigue, new junctional rhythm, worsening AKI, troponin elevation, and possible volume overload.    Principal Problem:    AKI (acute kidney injury) (CMS-HCC)  Active Problems:    Spinal stenosis of lumbar region    COPD (chronic obstructive pulmonary disease) (CMS-HCC)    Sleep apnea in adult    Essential hypertension    Type 2 diabetes mellitus with stage 3b chronic kidney disease, without long-term current use of insulin (CMS-HCC)    Anemia in chronic kidney disease    Chronic kidney disease, stage III (moderate) (CMS-HCC)    Pulmonary hypertension (CMS-HCC)    Chronic prescription opiate use    Cardiac amyloidosis (CMS-HCC)  Resolved Problems:    * No resolved hospital problems. *    [name] (LVEF 55%, GIIDD) -- Cardiac Amyloidosis:   She's had issues with hypotension on ARB and BB, as well as atrial arrhythmias. SPEP/UPEP/free light chains without concern. PYP NM SPECT + ??for ATTR amyloid, grade 3 uptake. 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 Recently hospitalized and discharged on 12/6 for [name] exacerbation managed with [name] and metolazone. BNP is elevated to 854, which is elevated from prior.  On 11/29/2021 BNP was 622, on 11/14/2021, BNP was 301.  Last echo on 03/2021 showed LVEF greater than 55% and grade 2 diastolic dysfunction.  Dry weight is 214 pounds.  Interestingly, as of 12/03/2021, patient was at her dry weight of 213 pounds. Suspect patient has lost weight in recent weeks and that true dry weight is now lower.  Despite lack of overt volume overload signs and symptoms, given elevated BNP, subjective fatigue, and worsening AKI, feel that patient does need continued diuresis. Elevate troponin trended down. Variabilities in troponin is expected with amyloidosis. Given that patient did not have any ACS symptoms, will stop trending. Echo showed hyperdynamic LV, LVEF 65-70%, LV wall weakness is severely increased. Grade 3 diastolic dysfunction. RV dilated. Responsive to diuretics, had net negative 1.8 L output as of 12/15. Was switched to PO torsemide higher dose, 80 BID, will decrease to daily today given the worsening Cr. Before discharge, want to make sure that Cr has at least plateaued.   - Continue spironolactone   - Continue Torsemide 80 daily   - Metolazone 10 daily  - Continued Tafamidis 61 mg daily   - Strict I/Os  - Daily weight   - Palliative care outpatient follow up     Hematochizia -- Chronic Microcytic Anemia -- Iron Deficiency Anemia:  Two bloody bowel movements noted night of 12/12. Hemoglobin 12/13 down to 7.3 from baseline of around 8.8. Patient received IV iron 12/13, hemoglobin 12/15 up to 8.5. Blood per rectum thought most likely to be the result of increased dosing of Eliquis (5 mg up from home dose of 2.5 mg BID). Chronically low hemoglobin likely related to [name] 4, AoCD, and iron deficiency anemia. Has received IV iron in the past. Iron at 12 and saturation at less than 4%. Has not had another blood stool as of 12/16.   - Continue holding Eliquis; defer restarting Eliquis to outpatient setting  - Monitor bowel movements for additional signs of  GI bleeding  - Trend CBC    Paroxysmal AF -- Atrial Fibrillation -- New Junctional Rhythm:  Paroxysmal atrial fibrillation, most recently has remained in normal sinus rhythm but as of this hospitalization is in a junctional rhythm.  On amiodarone and apixaban at home. CHA2DS2-VASc score = 5 (1-gender, 2-Age, 1-DM, 1-HTN).  She did have a cardioversion on 01/10/2021 and was in normal sinus rhythm afterwards. EKG showed sinus rhythm with 1st degree AV block and low voltage Q waves.   -Eliquis 5 mg BID discontinued due to blood per rectum  -Amiodarone reduced dose 100 mg daily 2/2 to bothersome tremor as a side effect     AKI on [name] IV:  GFR 10 on admission, acutely worsened.  Creatinine 4.60, up from 2.5 on 12/02/2021.  BUN elevated to 87, up from 67.  BUN/creatinine ratio is 20, suggesting possible prerenal/cardiorenal etiology, although because of acute rise given stabilization with discharge from hospital 4 days ago is unclear.  Patient is producing urine, although decreased from prior.  Has a pure wick in place. Renal US unremarkable. Calculate Fena at 1.6%, saying intrinsic damage, but most likely it is pre-renal AKi on top of ongoing [name]. Creatinine increased as of 12/16 from 4.32 to 4.61. Phosphorus today up to 5.8 from 5.4. Suspect from overdiuresis.   - Avoid nephrotoxic agents  - Continue sevelamer 800 mg TID PO with meals for hyperphosphatemia  - Decrease torsemide 80 from BID to daily   -Trend BMP, Phos, Cr  - Holding gabapentin given the AKI     T2DM (HbA1c 7.0%):  Patient is reporting increased blood sugar readings at home - was previously on metformin in 2021 but this was discontinued given normal A1c.  Hemoglobin A1c is newly elevated to 7.0 from 5.7 previously, last 05/2020 in the system.  - SSI  - Do not restart metformin on discharge, given kidney function  - Continue Statin      Group 2 pHTN:  Followed by Dr. [name], [facility] pulmonology, thought to be secondary to either [name] or COPD.  - ctm    COPD -- Chronic Hypoxemic Respiratory Failure:   On 3L at home at baseline. No evidence of exacerbation at this time.  Arrived to floor on 4 L nasal cannula. Now (12/15) down to 3.5 L [place], breathing comfortably with O2 sats >92%.    - Home nebs  - Albuterol as needed  - IS    Daily Checklist:  Diet: Regular Diet  DVT PPx: Full anticoagulation   Electrolytes: Replete Potassium to >/=4 and Magnesium to >/=2  Code Status: DNR and DNI  Dispo: TBD    Team Contact Information:   Primary Team: Cardiology - Team 1 (MEDC1)  Primary Resident: Yehuda Savannah, MD  Resident's Pager: [phone] (Cardiology Team 1 Intern)    Interval History:   Patient is on 3.5 L oxygen, which her home requirement. She does not have shortness of breath or chest pain. Not volume overloaded, was switched to oral diuretics. Will watch creatinine closely and if it plateaus, she will be able to go home and follow up at diuresis clinic.   .All other systems were reviewed and are negative except as noted in the HPI    Objective:     Temp:  [36.3 ??C (97.3 ??F)-36.5 ??C (97.7 ??F)] 36.3 ??C (97.3 ??F)  Heart Rate:  [55-63] 60  Resp:  [18] 18  BP: (109-125)/(47-60) 125/60  SpO2:  [96 %-98 %] 96 %,  Intake/Output Summary (Last 24 hours) at 12/12/2021 1111  Last data filed at 12/11/2021 1357  Gross per 24 hour   Intake 240 ml   Output 500 ml   Net -260 ml   , No intake/output data recorded.,  , O2 Flow Rate (L/min):  [3.5 L/min] 3.5 L/min, Body mass index is 34.12 kg/m??.,   Wt Readings from Last 3 Encounters:   12/09/21 95.9 kg (211 lb 6.7 oz)   12/03/21 96.6 kg (213 lb)   12/02/21 100.3 kg (221 lb 1.9 oz)     Gen: in NAD, answers questions appropriately  Eyes: sclera anicteric, EOMI  HENT: atraumatic, MMM, OP w/o erythema or exudate   Heart: RRR, S1, S2, no M/R/G, no chest wall tenderness, no JVD   Lungs: CTAB, no crackles or wheezes, no use of accessory muscles  Extremities: no clubbing, cyanosis, or edema in the BLEs  Psych: Alert, oriented, appropriate mood and affect    Labs/Studies: Labs and Studies from the last 24hrs per EMR and Reviewed    Yehuda Savannah, MD  PGY1, Internal Medicine

## 2021-12-12 NOTE — Unmapped (Signed)
Home Health Referral:    [facility]: [phone]    Start of care for Home Health services will begin within 48 hours of discharge. You will receive a call from the Home Health agency to schedule the first visit.

## 2021-12-12 NOTE — Unmapped (Signed)
VSS. No complaint of pain. O2 saturation stable on 3.5L [place]. Foley removed per orders; TOV ongoing. Encouraging PO intake but pt with little appetite. Daughter at [facility], involved in care. Pt currently sitting in chair comfortably.  Problem: Adult Inpatient Plan of Care  Goal: Plan of Care Review  Outcome: Progressing  Goal: Patient-Specific Goal (Individualized)  Outcome: Progressing  Goal: Absence of Hospital-Acquired Illness or Injury  Outcome: Progressing  Intervention: Identify and Manage Fall Risk  Recent Flowsheet Documentation  Taken 12/11/2021 0800 by Pamala Hurry, RN  Safety Interventions:   bed alarm   fall reduction program maintained   lighting adjusted for tasks/safety   low bed   nonskid shoes/slippers when out of bed   commode/urinal/bedpan at bedside   family at bedside  Goal: Optimal Comfort and Wellbeing  Outcome: Progressing  Goal: Readiness for Transition of Care  Outcome: Progressing  Goal: Rounds/Family Conference  Outcome: Progressing     Problem: Self-Care Deficit  Goal: Improved Ability to Complete Activities of Daily Living  Outcome: Progressing     Problem: Skin Injury Risk Increased  Goal: Skin Health and Integrity  Outcome: Progressing  Intervention: Optimize Skin Protection  Recent Flowsheet Documentation  Taken 12/11/2021 0800 by Pamala Hurry, RN  Pressure Reduction Techniques: frequent weight shift encouraged  Pressure Reduction Devices: pressure-redistributing mattress utilized     Problem: COPD (Chronic Obstructive Pulmonary Disease) Comorbidity  Goal: Maintenance of COPD Symptom Control  Outcome: Progressing     Problem: Diabetes Comorbidity  Goal: Blood Glucose Level Within Targeted Range  Outcome: Progressing     Problem: Heart Failure Comorbidity  Goal: Maintenance of Heart Failure Symptom Control  Outcome: Progressing     Problem: Hypertension Comorbidity  Goal: Blood Pressure in Desired Range  Outcome: Progressing     Problem: Obstructive Sleep Apnea Risk or Actual Comorbidity Management  Goal: Unobstructed Breathing During Sleep  Outcome: Progressing     Problem: Pain Chronic (Persistent) (Comorbidity Management)  Goal: Acceptable Pain Control and Functional Ability  Outcome: Progressing

## 2021-12-12 NOTE — Unmapped (Signed)
Pharmacist Discharge Note  Patient Name: Barbara Huber  Reason for Admission: Volume overload ([name])  Reason for Writing this Note: high risk for readmission or adverse drug reaction as determined by healthcare team, patient requires medication-related outpatient intervention and/or monitoring    Heart Failure  Barbara Huber has HF with preserved ejection fraction (EF = >70% w/G2DD, 12/09/21) and cardiac amyloidosis    Barbara Huber is taking the following evidence-based heart failure medications:    TTR stabilizer: tafamidis 61 mg once daily. This is not different from her home regimen.    Aldosterone Receptor Antagonist: spironolactone 25 mg once daily. This is not different from her home regimen    SGLT2 Inhibitor: Not indicated due to severely decreased renal function    Diuretics: torsemide 40 mg twice daily. This is increased from admission. Her Scr on admission was 4.60 and her lowest Scr during this admission was 3.46  Her most recent SCr is   Lab Results   Component Value Date    CREATININE 4.34 (H) 12/14/2021   , BNP is   Lab Results   Component Value Date    BNP 853.90 (H) 12/06/2021   , and weight is   Vitals:    12/13/21 0610 12/13/21 2326   Weight: 94.4 kg (208 lb 1.8 oz) 93.3 kg (205 lb 11 oz)        IV Iron: in the setting of HF and IDA, this patient was found to be iron deficient (Iron sat 4%, ferritin 12.6). She received iron gluconate 750 mg during this admission    Atrial Fibrillation  Barbara Huber is taking the following medications for Afib:    Anticoagulation: apixaban 2.5 mg twice daily. This is was HELD at hospital discharge with concerns for GI bleeding. Patient had multiple melanotic stools with possible drop in hemoglobin although difficult to discern between acute bleed and chronic IDA.     CHADS-VASc score: 5    Rate Control: None    Rhythm Control: amiodarone 100 mg once daily. This is not different from her home regimen      Medication Access  Patient received her home tafamidis as a patient own medication during this admission    Outpatient Follow-up  Follow-up with diuresis clinic on 12/16/21  Recommend restarting apixaban 2.5 mg BID if patient denies signs/symptoms of GI bleeding at follow-up    Alexis Frock, PharmD, [facility], BCPS  Cardiology Clinical Pharmacist  Pager: [phone]

## 2021-12-12 NOTE — Unmapped (Addendum)
Problem: Adult Inpatient Plan of Care  Goal: Plan of Care Review  Outcome: Progressing  Goal: Patient-Specific Goal (Individualized)  Outcome: Progressing  Goal: Absence of Hospital-Acquired Illness or Injury  Outcome: Progressing  Intervention: Identify and Manage Fall Risk  Recent Flowsheet Documentation  Taken 12/12/2021 0200 by Fritzi Mandes, RN  Safety Interventions:  ??? bed alarm  ??? bleeding precautions  ??? commode/urinal/bedpan at bedside  ??? fall reduction program maintained  ??? family at bedside  ??? lighting adjusted for tasks/safety  ??? low bed  ??? nonskid shoes/slippers when out of bed  Taken 12/12/2021 0000 by Fritzi Mandes, RN  Safety Interventions:  ??? bed alarm  ??? bleeding precautions  ??? commode/urinal/bedpan at bedside  ??? fall reduction program maintained  ??? family at bedside  ??? lighting adjusted for tasks/safety  ??? low bed  ??? nonskid shoes/slippers when out of bed  Taken 12/11/2021 2200 by Fritzi Mandes, RN  Safety Interventions:  ??? bed alarm  ??? bleeding precautions  ??? commode/urinal/bedpan at bedside  ??? fall reduction program maintained  ??? family at bedside  ??? lighting adjusted for tasks/safety  ??? low bed  ??? nonskid shoes/slippers when out of bed  Taken 12/11/2021 2000 by Fritzi Mandes, RN  Safety Interventions:  ??? bed alarm  ??? bleeding precautions  ??? commode/urinal/bedpan at bedside  ??? fall reduction program maintained  ??? family at bedside  ??? lighting adjusted for tasks/safety  ??? low bed  ??? nonskid shoes/slippers when out of bed  Intervention: Prevent Skin Injury  Recent Flowsheet Documentation  Taken 12/11/2021 2000 by Fritzi Mandes, RN  Skin Protection:  ??? adhesive use limited  ??? incontinence pads utilized  Intervention: Prevent and Manage VTE (Venous Thromboembolism) Risk  Recent Flowsheet Documentation  Taken 12/11/2021 2000 by Fritzi Mandes, RN  Activity Management: activity adjusted per tolerance  VTE Prevention/Management: bleeding precautions maintained  Goal: Optimal Comfort and Wellbeing  Outcome: Progressing  Goal: Readiness for Transition of Care  Outcome: Progressing  Goal: Rounds/Family Conference  Outcome: Progressing     Problem: Self-Care Deficit  Goal: Improved Ability to Complete Activities of Daily Living  Outcome: Progressing     Problem: Skin Injury Risk Increased  Goal: Skin Health and Integrity  Outcome: Progressing  Intervention: Optimize Skin Protection  Recent Flowsheet Documentation  Taken 12/11/2021 2000 by Fritzi Mandes, RN  Pressure Reduction Techniques: frequent weight shift encouraged  Pressure Reduction Devices: pressure-redistributing mattress utilized  Skin Protection:  ??? adhesive use limited  ??? incontinence pads utilized     Problem: COPD (Chronic Obstructive Pulmonary Disease) Comorbidity  Goal: Maintenance of COPD Symptom Control  Outcome: Progressing     Problem: Diabetes Comorbidity  Goal: Blood Glucose Level Within Targeted Range  Outcome: Progressing     Problem: Heart Failure Comorbidity  Goal: Maintenance of Heart Failure Symptom Control  Outcome: Progressing     Problem: Hypertension Comorbidity  Goal: Blood Pressure in Desired Range  Outcome: Progressing     Problem: Obstructive Sleep Apnea Risk or Actual Comorbidity Management  Goal: Unobstructed Breathing During Sleep  Outcome: Progressing     Problem: Pain Chronic (Persistent) (Comorbidity Management)  Goal: Acceptable Pain Control and Functional Ability  Outcome: Progressing     VSS, Patient alert and oriented x4. Follows commands. Sinus rhythm. Foley catheter removed during previous shift, trial of void completed and passed overnight. Patient family at bedside and updated on patient status and plan of care. Call bell within reach, bed  alarm.

## 2021-12-13 LAB — BASIC METABOLIC PANEL
ANION GAP: 9 mmol/L (ref 5–14)
BLOOD UREA NITROGEN: 89 mg/dL — ABNORMAL HIGH (ref 9–23)
BUN / CREAT RATIO: 18
CALCIUM: 9.6 mg/dL (ref 8.7–10.4)
CHLORIDE: 86 mmol/L — ABNORMAL LOW (ref 98–107)
CO2: 33 mmol/L — ABNORMAL HIGH (ref 20.0–31.0)
CREATININE: 5.01 mg/dL — ABNORMAL HIGH
EGFR CKD-EPI (2021) FEMALE: 8 mL/min/{1.73_m2} — ABNORMAL LOW (ref >=60)
GLUCOSE RANDOM: 149 mg/dL — ABNORMAL HIGH (ref 70–99)
POTASSIUM: 4 mmol/L (ref 3.4–4.8)
SODIUM: 128 mmol/L — ABNORMAL LOW (ref 135–145)

## 2021-12-13 LAB — PHOSPHORUS: PHOSPHORUS: 5.8 mg/dL — ABNORMAL HIGH (ref 2.4–5.1)

## 2021-12-13 LAB — CBC
HEMATOCRIT: 29.3 % — ABNORMAL LOW (ref 34.0–44.0)
HEMOGLOBIN: 9.1 g/dL — ABNORMAL LOW (ref 11.3–14.9)
MEAN CORPUSCULAR HEMOGLOBIN CONC: 31.1 g/dL — ABNORMAL LOW (ref 32.0–36.0)
MEAN CORPUSCULAR HEMOGLOBIN: 23.5 pg — ABNORMAL LOW (ref 25.9–32.4)
MEAN CORPUSCULAR VOLUME: 75.5 fL — ABNORMAL LOW (ref 77.6–95.7)
MEAN PLATELET VOLUME: 7.9 fL (ref 6.8–10.7)
PLATELET COUNT: 372 10*9/L (ref 150–450)
RED BLOOD CELL COUNT: 3.89 10*12/L — ABNORMAL LOW (ref 3.95–5.13)
RED CELL DISTRIBUTION WIDTH: 18.2 % — ABNORMAL HIGH (ref 12.2–15.2)
WBC ADJUSTED: 8.4 10*9/L (ref 3.6–11.2)

## 2021-12-13 LAB — MAGNESIUM: MAGNESIUM: 3.4 mg/dL — ABNORMAL HIGH (ref 1.6–2.6)

## 2021-12-13 MED ADMIN — montelukast (SINGULAIR) tablet 10 mg: 10 mg | ORAL | @ 02:00:00

## 2021-12-13 MED ADMIN — polyethylene glycol (MIRALAX) packet 17 g: 17 g | ORAL | @ 02:00:00

## 2021-12-13 MED ADMIN — Tafamidis 61 mg capsule - **PATIENT SUPPLIED MEDICATION**: 61 mg | ORAL | @ 16:00:00

## 2021-12-13 MED ADMIN — docusate sodium (COLACE) capsule 100 mg: 100 mg | ORAL | @ 17:00:00

## 2021-12-13 MED ADMIN — pantoprazole (PROTONIX) EC tablet 20 mg: 20 mg | ORAL | @ 16:00:00

## 2021-12-13 MED ADMIN — acetaminophen (TYLENOL) tablet 650 mg: 650 mg | ORAL | @ 02:00:00

## 2021-12-13 MED ADMIN — senna (SENOKOT) tablet 1 tablet: 1 | ORAL | @ 02:00:00

## 2021-12-13 MED ADMIN — oxyCODONE (OxyCONTIN) 12 hr crush resistant ER/CR tablet 10 mg: 10 mg | ORAL | @ 02:00:00 | Stop: 2021-12-20

## 2021-12-13 MED ADMIN — sevelamer (RENVELA) tablet 1,600 mg: 1600 mg | ORAL | @ 16:00:00

## 2021-12-13 MED ADMIN — polyethylene glycol (MIRALAX) packet 17 g: 17 g | ORAL | @ 20:00:00

## 2021-12-13 MED ADMIN — oxyCODONE (OxyCONTIN) 12 hr crush resistant ER/CR tablet 10 mg: 10 mg | ORAL | @ 16:00:00 | Stop: 2021-12-20

## 2021-12-13 MED ADMIN — sevelamer (RENVELA) tablet 1,600 mg: 1600 mg | ORAL | @ 23:00:00

## 2021-12-13 MED ADMIN — polyethylene glycol (MIRALAX) packet 17 g: 17 g | ORAL | @ 14:00:00 | Stop: 2021-12-13

## 2021-12-13 MED ADMIN — amiodarone (PACERONE) tablet 100 mg: 100 mg | ORAL | @ 16:00:00

## 2021-12-13 MED ADMIN — pravastatin (PRAVACHOL) tablet 10 mg: 10 mg | ORAL | @ 16:00:00

## 2021-12-13 MED ADMIN — fluticasone furoate-vilanteroL (BREO ELLIPTA) 100-25 mcg/dose inhaler 1 puff: 1 | RESPIRATORY_TRACT | @ 14:00:00

## 2021-12-13 MED ADMIN — spironolactone (ALDACTONE) tablet 25 mg: 25 mg | ORAL | @ 16:00:00

## 2021-12-13 MED ADMIN — umeclidinium (INCRUSE ELLIPTA) 62.5 mcg/actuation inhaler 1 puff: 1 | RESPIRATORY_TRACT | @ 14:00:00

## 2021-12-13 NOTE — Unmapped (Signed)
Cardiology - Team 1 [facility]) Progress Note    Assessment & Plan:   Ms. Anastacio is an 80 y/o F w/ a PMHx of [name] (iso cardiac amyloidosis), pAF (s/p DCCV 12/2020 w/ conversion to NSR, on Eliquis), [name] IV, T2DM (A1c 7.0 11/2021), IDA, COPD (on home O2 3.5L), and pHTN who presents for management of fatigue, new junctional rhythm, worsening AKI, troponin elevation, and possible volume overload.    Principal Problem:    AKI (acute kidney injury) (CMS-HCC)  Active Problems:    Spinal stenosis of lumbar region    COPD (chronic obstructive pulmonary disease) (CMS-HCC)    Sleep apnea in adult    Essential hypertension    Type 2 diabetes mellitus with stage 3b chronic kidney disease, without long-term current use of insulin (CMS-HCC)    Anemia in chronic kidney disease    Chronic kidney disease, stage III (moderate) (CMS-HCC)    Pulmonary hypertension (CMS-HCC)    Chronic prescription opiate use    Cardiac amyloidosis (CMS-HCC)  Resolved Problems:    * No resolved hospital problems. *    [name] (LVEF 55%, GIIDD) -- Cardiac Amyloidosis:   She's had issues with hypotension on ARB and BB, as well as atrial arrhythmias. SPEP/UPEP/free light chains without concern. PYP NM SPECT + ??for ATTR amyloid, grade 3 uptake. 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 Recently hospitalized and discharged on 12/6 for [name] exacerbation managed with [name] and metolazone. BNP is elevated to 854, which is elevated from prior.  On 11/29/2021 BNP was 622, on 11/14/2021, BNP was 301.  Last echo on 03/2021 showed LVEF greater than 55% and grade 2 diastolic dysfunction.  Dry weight is 214 pounds.  Interestingly, as of 12/03/2021, patient was at her dry weight of 213 pounds. Suspect patient has lost weight in recent weeks and that true dry weight is now lower.  Despite lack of overt volume overload signs and symptoms, given elevated BNP, subjective fatigue, and worsening AKI, feel that patient does need continued diuresis. Elevate troponin trended down. Variabilities in troponin is expected with amyloidosis. Given that patient did not have any ACS symptoms, will stop trending. Echo showed hyperdynamic LV, LVEF 65-70%, LV wall weakness is severely increased. Grade 3 diastolic dysfunction. RV dilated. Given that Cr is uptrending, now from 4.61 to 5.01, most likely 2/2 to diuresis and dehydration from bleeding, will stop torsemide and monitor her kidney function.   - Continue spironolactone   - Stopped Torsemide   - Stopped Metolazone   - Continued Tafamidis 61 mg daily   - Strict I/Os  - Daily weight   - Palliative care outpatient follow up     Hematochizia -- Chronic Microcytic Anemia -- Iron Deficiency Anemia:  Two bloody bowel movements noted night of 12/12. Hemoglobin 12/13 down to 7.3 from baseline of around 8.8. Patient received IV iron 12/13, hemoglobin 12/15 up to 8.5. Blood per rectum thought most likely to be the result of increased dosing of Eliquis (5 mg up from home dose of 2.5 mg BID). Chronically low hemoglobin likely related to [name] 4, AoCD, and iron deficiency anemia. Has received IV iron in the past. Iron at 12 and saturation at less than 4%. Has not had another blood stool as of 12/17. Hg Stable at 9.1.   - Continue holding Eliquis; defer restarting Eliquis to outpatient setting  - Monitor bowel movements for additional signs of GI bleeding  - Trend CBC    Paroxysmal AF -- Atrial  Fibrillation -- New Junctional Rhythm:  Paroxysmal atrial fibrillation, most recently has remained in normal sinus rhythm but as of this hospitalization is in a junctional rhythm.  On amiodarone and apixaban at home. CHA2DS2-VASc score = 5 (1-gender, 2-Age, 1-DM, 1-HTN).  She did have a cardioversion on 01/10/2021 and was in normal sinus rhythm afterwards. EKG showed sinus rhythm with 1st degree AV block and low voltage Q waves.   -Eliquis 5 mg BID discontinued due to blood per rectum  -Amiodarone reduced dose 100 mg daily 2/2 to bothersome tremor as a side effect     AKI on [name] IV:  GFR 10 on admission, acutely worsened.  Creatinine 4.60, up from 2.5 on 12/02/2021.  BUN elevated to 87, up from 67.  BUN/creatinine ratio is 20, suggesting possible prerenal/cardiorenal etiology, although because of acute rise given stabilization with discharge from hospital 4 days ago is unclear.  Patient is producing urine, although decreased from prior.  Has a pure wick in place. Renal US unremarkable. Calculate Fena at 1.6%, saying intrinsic damage, but most likely it is pre-renal AKi on top of ongoing [name]. Creatinine at 5.01, uptrending, will hold diuresis.   - Avoid nephrotoxic agents  - Continue sevelamer 800 mg TID PO with meals for hyperphosphatemia   -Trend BMP, Phos, Cr  - Holding gabapentin given the AKI     T2DM (HbA1c 7.0%):  Patient is reporting increased blood sugar readings at home - was previously on metformin in 2021 but this was discontinued given normal A1c.  Hemoglobin A1c is newly elevated to 7.0 from 5.7 previously, last 05/2020 in the system.  - SSI  - Do not restart metformin on discharge, given kidney function  - Continue Statin      Group 2 pHTN:  Followed by Dr. [name], [facility] pulmonology, thought to be secondary to either [name] or COPD.  - ctm    COPD -- Chronic Hypoxemic Respiratory Failure:   On 3L at home at baseline. No evidence of exacerbation at this time.  Arrived to floor on 4 L nasal cannula. Now (12/15) down to 3.5 L [place], breathing comfortably with O2 sats >92%.    - Home nebs  - Albuterol as needed  - IS    Daily Checklist:  Diet: Regular Diet  DVT PPx: Full anticoagulation   Electrolytes: Replete Potassium to >/=4 and Magnesium to >/=2  Code Status: DNR and DNI  Dispo: TBD    Team Contact Information:   Primary Team: Cardiology - Team 1 ([facility])  Primary Resident: Yehuda Savannah, MD  Resident's Pager: [phone] (Cardiology Team 1 Intern)    Interval History:   Patient was sitting in a chair this morning. Denies shortness of breath, chest pain. Has constipation and asked for meds. Uses CPAP overnight.   .All other systems were reviewed and are negative except as noted in the HPI    Objective:     Temp:  [36.2 ??C (97.2 ??F)-36.7 ??C (98.1 ??F)] 36.7 ??C (98.1 ??F)  Heart Rate:  [60-63] 60  Resp:  [16-18] 17  BP: (114-133)/(55-62) 114/61  SpO2:  [97 %-100 %] 97 %,   No intake or output data in the 24 hours ending 12/13/21 1041, No intake/output data recorded.,  , O2 Flow Rate (L/min):  [3.5 L/min] 3.5 L/min, Body mass index is 33.59 kg/m??.,   Wt Readings from Last 3 Encounters:   12/13/21 94.4 kg (208 lb 1.8 oz)   12/03/21 96.6 kg (213 lb)   12/02/21 100.3 kg (  221 lb 1.9 oz)     Gen: in NAD, answers questions appropriately  Eyes: sclera anicteric, EOMI  HENT: atraumatic, MMM, OP w/o erythema or exudate   Heart: RRR, S1, S2, no M/R/G, no chest wall tenderness, no JVD   Lungs: CTAB, no crackles or wheezes, no use of accessory muscles  Extremities: no clubbing, cyanosis, or edema in the BLEs  Psych: Alert, oriented, appropriate mood and affect    Labs/Studies: Labs and Studies from the last 24hrs per EMR and Reviewed    Yehuda Savannah, MD  PGY1, Internal Medicine

## 2021-12-13 NOTE — Unmapped (Signed)
Problem: Adult Inpatient Plan of Care  Goal: Plan of Care Review  Outcome: Progressing  Goal: Patient-Specific Goal (Individualized)  Outcome: Progressing  Goal: Absence of Hospital-Acquired Illness or Injury  Outcome: Progressing  Intervention: Identify and Manage Fall Risk  Recent Flowsheet Documentation  Taken 12/12/2021 0800 by Vern Claude, RN  Safety Interventions:   bed alarm   fall reduction program maintained   family at bedside   low bed  Intervention: Prevent and Manage VTE (Venous Thromboembolism) Risk  Recent Flowsheet Documentation  Taken 12/12/2021 0800 by Vern Claude, RN  Activity Management: activity adjusted per tolerance  Intervention: Prevent Infection  Recent Flowsheet Documentation  Taken 12/12/2021 0800 by Vern Claude, RN  Infection Prevention: hand hygiene promoted  Goal: Optimal Comfort and Wellbeing  Outcome: Progressing  Goal: Readiness for Transition of Care  Outcome: Progressing  Goal: Rounds/Family Conference  Outcome: Progressing     Problem: Self-Care Deficit  Goal: Improved Ability to Complete Activities of Daily Living  Outcome: Progressing     Problem: Skin Injury Risk Increased  Goal: Skin Health and Integrity  Outcome: Progressing  Intervention: Optimize Skin Protection  Recent Flowsheet Documentation  Taken 12/12/2021 0800 by Vern Claude, RN  Pressure Reduction Techniques: frequent weight shift encouraged  Pressure Reduction Devices: pressure-redistributing mattress utilized     Problem: COPD (Chronic Obstructive Pulmonary Disease) Comorbidity  Goal: Maintenance of COPD Symptom Control  Outcome: Progressing     Problem: Diabetes Comorbidity  Goal: Blood Glucose Level Within Targeted Range  Outcome: Progressing     Problem: Heart Failure Comorbidity  Goal: Maintenance of Heart Failure Symptom Control  Outcome: Progressing     Problem: Hypertension Comorbidity  Goal: Blood Pressure in Desired Range  Outcome: Progressing     Problem: Obstructive Sleep Apnea Risk or Actual Comorbidity Management  Goal: Unobstructed Breathing During Sleep  Outcome: Progressing     Problem: Pain Chronic (Persistent) (Comorbidity Management)  Goal: Acceptable Pain Control and Functional Ability  Outcome: Progressing   SB/NSR with HR of 50s to 60s on monitor. Denies pain. Work with occupational therapist this shift, tolerate the treatment well. No acute event over the shift. Resting in bed comfortably at present, call bell within reach. Family at bedside. Will continue to monitor closely.

## 2021-12-13 NOTE — Unmapped (Signed)
Problem: Adult Inpatient Plan of Care  Goal: Plan of Care Review  Outcome: Progressing  Goal: Patient-Specific Goal (Individualized)  Outcome: Progressing  Goal: Absence of Hospital-Acquired Illness or Injury  Outcome: Progressing  Intervention: Identify and Manage Fall Risk  Recent Flowsheet Documentation  Taken 12/13/2021 0000 by Fritzi Mandes, RN  Safety Interventions:   bed alarm   bleeding precautions   commode/urinal/bedpan at bedside   fall reduction program maintained   family at bedside   lighting adjusted for tasks/safety   low bed   nonskid shoes/slippers when out of bed  Taken 12/12/2021 2200 by Fritzi Mandes, RN  Safety Interventions:   bed alarm   bleeding precautions   commode/urinal/bedpan at bedside   fall reduction program maintained   family at bedside   lighting adjusted for tasks/safety   low bed   nonskid shoes/slippers when out of bed  Taken 12/12/2021 2000 by Fritzi Mandes, RN  Safety Interventions:   bed alarm   bleeding precautions   commode/urinal/bedpan at bedside   fall reduction program maintained   family at bedside   lighting adjusted for tasks/safety   low bed   nonskid shoes/slippers when out of bed  Intervention: Prevent Skin Injury  Recent Flowsheet Documentation  Taken 12/12/2021 2000 by Fritzi Mandes, RN  Skin Protection:   adhesive use limited   incontinence pads utilized  Intervention: Prevent and Manage VTE (Venous Thromboembolism) Risk  Recent Flowsheet Documentation  Taken 12/12/2021 2000 by Fritzi Mandes, RN  Activity Management: activity adjusted per tolerance  VTE Prevention/Management: bleeding precautions maintained  Goal: Optimal Comfort and Wellbeing  Outcome: Progressing  Goal: Readiness for Transition of Care  Outcome: Progressing  Goal: Rounds/Family Conference  Outcome: Progressing     Problem: Self-Care Deficit  Goal: Improved Ability to Complete Activities of Daily Living  Outcome: Progressing     Problem: Skin Injury Risk Increased  Goal: Skin Health and Integrity  Outcome: Progressing  Intervention: Optimize Skin Protection  Recent Flowsheet Documentation  Taken 12/12/2021 2000 by Fritzi Mandes, RN  Pressure Reduction Techniques: frequent weight shift encouraged  Pressure Reduction Devices: pressure-redistributing mattress utilized  Skin Protection:   adhesive use limited   incontinence pads utilized     Problem: COPD (Chronic Obstructive Pulmonary Disease) Comorbidity  Goal: Maintenance of COPD Symptom Control  Outcome: Progressing     Problem: Diabetes Comorbidity  Goal: Blood Glucose Level Within Targeted Range  Outcome: Progressing     Problem: Heart Failure Comorbidity  Goal: Maintenance of Heart Failure Symptom Control  Outcome: Progressing     Problem: Hypertension Comorbidity  Goal: Blood Pressure in Desired Range  Outcome: Progressing     Problem: Obstructive Sleep Apnea Risk or Actual Comorbidity Management  Goal: Unobstructed Breathing During Sleep  Outcome: Progressing     Problem: Pain Chronic (Persistent) (Comorbidity Management)  Goal: Acceptable Pain Control and Functional Ability  Outcome: Progressing     VSS, SB/NSR with HR of 50s to 60s on monitor. PRN tylenol given x1 with good effect as well as PRN oxycodone x1 with good effect. During previous shift, patient worked with occupational therapist and tolerated the treatment well. No acute event over the shift. Resting in bed comfortably at present, call bell within reach. Family at bedside. Awaiting home health set up prior to discharge, possibly on 12/18.

## 2021-12-13 NOTE — Unmapped (Signed)
Additional order received and the patient is already on the caseload. Continue with  plan of care.

## 2021-12-14 LAB — CBC
HEMATOCRIT: 30.4 % — ABNORMAL LOW (ref 34.0–44.0)
HEMOGLOBIN: 9.3 g/dL — ABNORMAL LOW (ref 11.3–14.9)
MEAN CORPUSCULAR HEMOGLOBIN CONC: 30.7 g/dL — ABNORMAL LOW (ref 32.0–36.0)
MEAN CORPUSCULAR HEMOGLOBIN: 23.3 pg — ABNORMAL LOW (ref 25.9–32.4)
MEAN CORPUSCULAR VOLUME: 75.9 fL — ABNORMAL LOW (ref 77.6–95.7)
MEAN PLATELET VOLUME: 8.1 fL (ref 6.8–10.7)
PLATELET COUNT: 363 10*9/L (ref 150–450)
RED BLOOD CELL COUNT: 4 10*12/L (ref 3.95–5.13)
RED CELL DISTRIBUTION WIDTH: 18.5 % — ABNORMAL HIGH (ref 12.2–15.2)
WBC ADJUSTED: 9.4 10*9/L (ref 3.6–11.2)

## 2021-12-14 LAB — BASIC METABOLIC PANEL
ANION GAP: 12 mmol/L (ref 5–14)
BLOOD UREA NITROGEN: 90 mg/dL — ABNORMAL HIGH (ref 9–23)
BUN / CREAT RATIO: 21
CALCIUM: 9.6 mg/dL (ref 8.7–10.4)
CHLORIDE: 87 mmol/L — ABNORMAL LOW (ref 98–107)
CO2: 29 mmol/L (ref 20.0–31.0)
CREATININE: 4.34 mg/dL — ABNORMAL HIGH
EGFR CKD-EPI (2021) FEMALE: 10 mL/min/{1.73_m2} — ABNORMAL LOW (ref >=60)
GLUCOSE RANDOM: 165 mg/dL (ref 70–179)
POTASSIUM: 4 mmol/L (ref 3.4–4.8)
SODIUM: 128 mmol/L — ABNORMAL LOW (ref 135–145)

## 2021-12-14 LAB — PHOSPHORUS: PHOSPHORUS: 5 mg/dL (ref 2.4–5.1)

## 2021-12-14 LAB — MAGNESIUM: MAGNESIUM: 3.5 mg/dL — ABNORMAL HIGH (ref 1.6–2.6)

## 2021-12-14 MED ORDER — TORSEMIDE 10 MG TABLET
ORAL_TABLET | Freq: Two times a day (BID) | ORAL | 0 refills | 30 days | Status: CP
Start: 2021-12-14 — End: 2022-01-13

## 2021-12-14 MED ORDER — ONDANSETRON HCL 4 MG TABLET
ORAL_TABLET | Freq: Three times a day (TID) | ORAL | 0 refills | 5 days | Status: CP | PRN
Start: 2021-12-14 — End: 2021-12-24

## 2021-12-14 MED ADMIN — ondansetron (ZOFRAN) tablet 4 mg: 4 mg | ORAL | @ 17:00:00 | Stop: 2021-12-14

## 2021-12-14 MED ADMIN — pravastatin (PRAVACHOL) tablet 10 mg: 10 mg | ORAL | @ 15:00:00 | Stop: 2021-12-14

## 2021-12-14 MED ADMIN — amiodarone (PACERONE) tablet 100 mg: 100 mg | ORAL | @ 15:00:00 | Stop: 2021-12-14

## 2021-12-14 MED ADMIN — senna (SENOKOT) tablet 1 tablet: 1 | ORAL | @ 02:00:00

## 2021-12-14 MED ADMIN — fluticasone furoate-vilanteroL (BREO ELLIPTA) 100-25 mcg/dose inhaler 1 puff: 1 | RESPIRATORY_TRACT | @ 15:00:00 | Stop: 2021-12-14

## 2021-12-14 MED ADMIN — oxyCODONE (OxyCONTIN) 12 hr crush resistant ER/CR tablet 10 mg: 10 mg | ORAL | @ 02:00:00 | Stop: 2021-12-20

## 2021-12-14 MED ADMIN — Tafamidis 61 mg capsule - **PATIENT SUPPLIED MEDICATION**: 61 mg | ORAL | @ 15:00:00 | Stop: 2021-12-14

## 2021-12-14 MED ADMIN — sevelamer (RENVELA) tablet 1,600 mg: 1600 mg | ORAL | @ 17:00:00 | Stop: 2021-12-14

## 2021-12-14 MED ADMIN — polyethylene glycol (MIRALAX) packet 17 g: 17 g | ORAL | @ 02:00:00

## 2021-12-14 MED ADMIN — umeclidinium (INCRUSE ELLIPTA) 62.5 mcg/actuation inhaler 1 puff: 1 | RESPIRATORY_TRACT | @ 15:00:00 | Stop: 2021-12-14

## 2021-12-14 MED ADMIN — oxyCODONE (OxyCONTIN) 12 hr crush resistant ER/CR tablet 10 mg: 10 mg | ORAL | @ 15:00:00 | Stop: 2021-12-14

## 2021-12-14 MED ADMIN — sevelamer (RENVELA) tablet 1,600 mg: 1600 mg | ORAL | @ 15:00:00 | Stop: 2021-12-14

## 2021-12-14 MED ADMIN — docusate sodium (COLACE) capsule 100 mg: 100 mg | ORAL | @ 15:00:00 | Stop: 2021-12-14

## 2021-12-14 MED ADMIN — polyethylene glycol (MIRALAX) packet 17 g: 17 g | ORAL | @ 15:00:00 | Stop: 2021-12-14

## 2021-12-14 MED ADMIN — spironolactone (ALDACTONE) tablet 25 mg: 25 mg | ORAL | @ 15:00:00 | Stop: 2021-12-14

## 2021-12-14 MED ADMIN — montelukast (SINGULAIR) tablet 10 mg: 10 mg | ORAL | @ 02:00:00

## 2021-12-14 MED ADMIN — pantoprazole (PROTONIX) EC tablet 20 mg: 20 mg | ORAL | @ 15:00:00 | Stop: 2021-12-14

## 2021-12-14 MED ADMIN — acetaminophen (TYLENOL) tablet 650 mg: 650 mg | ORAL | @ 02:00:00

## 2021-12-14 NOTE — Unmapped (Signed)
Pt is alert and oriented with no complaints of pain.  VSS.  Medications given as ordered.  Daughter at bedside.  Ready for discharge.    Problem: Adult Inpatient Plan of Care  Goal: Plan of Care Review  Outcome: Progressing     Problem: Adult Inpatient Plan of Care  Goal: Patient-Specific Goal (Individualized)  Outcome: Progressing     Problem: Adult Inpatient Plan of Care  Goal: Absence of Hospital-Acquired Illness or Injury  Outcome: Progressing  Intervention: Identify and Manage Fall Risk  Recent Flowsheet Documentation  Taken 12/14/2021 0800 by Valentina Lucks, RN  Safety Interventions: low bed

## 2021-12-14 NOTE — Unmapped (Signed)
Problem: Adult Inpatient Plan of Care  Goal: Plan of Care Review  Outcome: Progressing  Goal: Patient-Specific Goal (Individualized)  Outcome: Progressing  Goal: Absence of Hospital-Acquired Illness or Injury  Outcome: Progressing  Intervention: Identify and Manage Fall Risk  Recent Flowsheet Documentation  Taken 12/13/2021 0800 by Vern Claude, RN  Safety Interventions:   bed alarm   fall reduction program maintained   family at bedside   low bed  Intervention: Prevent and Manage VTE (Venous Thromboembolism) Risk  Recent Flowsheet Documentation  Taken 12/13/2021 0800 by Vern Claude, RN  Activity Management: activity adjusted per tolerance  Intervention: Prevent Infection  Recent Flowsheet Documentation  Taken 12/13/2021 0800 by Vern Claude, RN  Infection Prevention: hand hygiene promoted  Goal: Optimal Comfort and Wellbeing  Outcome: Progressing  Goal: Readiness for Transition of Care  Outcome: Progressing  Goal: Rounds/Family Conference  Outcome: Progressing     Problem: Self-Care Deficit  Goal: Improved Ability to Complete Activities of Daily Living  Outcome: Progressing     Problem: Skin Injury Risk Increased  Goal: Skin Health and Integrity  Outcome: Progressing  Intervention: Optimize Skin Protection  Recent Flowsheet Documentation  Taken 12/13/2021 0800 by Vern Claude, RN  Pressure Reduction Techniques: frequent weight shift encouraged     Problem: COPD (Chronic Obstructive Pulmonary Disease) Comorbidity  Goal: Maintenance of COPD Symptom Control  Outcome: Progressing     Problem: Diabetes Comorbidity  Goal: Blood Glucose Level Within Targeted Range  Outcome: Progressing     Problem: Heart Failure Comorbidity  Goal: Maintenance of Heart Failure Symptom Control  Outcome: Progressing     Problem: Hypertension Comorbidity  Goal: Blood Pressure in Desired Range  Outcome: Progressing     Problem: Obstructive Sleep Apnea Risk or Actual Comorbidity Management  Goal: Unobstructed Breathing During Sleep  Outcome: Progressing     Problem: Pain Chronic (Persistent) (Comorbidity Management)  Goal: Acceptable Pain Control and Functional Ability  Outcome: Progressing   NSR on monitor. Oxycodone given to manage pain, with good effect. Bowel regimen revised this shift. Keep monitoring kidney function. No acute event throughout the shift. Resting in recliner at present, call bell within reach. Will continue to monitor closely.

## 2021-12-14 NOTE — Unmapped (Signed)
Problem: Adult Inpatient Plan of Care  Goal: Plan of Care Review  Outcome: Progressing  Goal: Patient-Specific Goal (Individualized)  Outcome: Progressing  Goal: Absence of Hospital-Acquired Illness or Injury  Outcome: Progressing  Intervention: Identify and Manage Fall Risk  Recent Flowsheet Documentation  Taken 12/14/2021 0200 by Fritzi Mandes, RN  Safety Interventions:   bed alarm   bleeding precautions   commode/urinal/bedpan at bedside   fall reduction program maintained   lighting adjusted for tasks/safety   low bed   nonskid shoes/slippers when out of bed   family at bedside  Taken 12/14/2021 0000 by Fritzi Mandes, RN  Safety Interventions:   bed alarm   bleeding precautions   commode/urinal/bedpan at bedside   fall reduction program maintained   lighting adjusted for tasks/safety   low bed   nonskid shoes/slippers when out of bed   family at bedside  Taken 12/13/2021 2200 by Fritzi Mandes, RN  Safety Interventions:   bed alarm   bleeding precautions   commode/urinal/bedpan at bedside   fall reduction program maintained   family at bedside   lighting adjusted for tasks/safety   low bed   nonskid shoes/slippers when out of bed  Taken 12/13/2021 2000 by Fritzi Mandes, RN  Safety Interventions:   bed alarm   bleeding precautions   commode/urinal/bedpan at bedside   fall reduction program maintained   family at bedside   lighting adjusted for tasks/safety   low bed   nonskid shoes/slippers when out of bed  Intervention: Prevent Skin Injury  Recent Flowsheet Documentation  Taken 12/13/2021 2000 by Fritzi Mandes, RN  Skin Protection:   adhesive use limited   incontinence pads utilized  Intervention: Prevent and Manage VTE (Venous Thromboembolism) Risk  Recent Flowsheet Documentation  Taken 12/13/2021 2000 by Fritzi Mandes, RN  Activity Management: activity adjusted per tolerance  VTE Prevention/Management: bleeding precautions maintained  Goal: Optimal Comfort and Wellbeing  Outcome: Progressing  Goal: Readiness for Transition of Care  Outcome: Progressing  Goal: Rounds/Family Conference  Outcome: Progressing     Problem: Self-Care Deficit  Goal: Improved Ability to Complete Activities of Daily Living  Outcome: Progressing     Problem: Skin Injury Risk Increased  Goal: Skin Health and Integrity  Outcome: Progressing  Intervention: Optimize Skin Protection  Recent Flowsheet Documentation  Taken 12/13/2021 2000 by Fritzi Mandes, RN  Pressure Reduction Techniques: frequent weight shift encouraged  Pressure Reduction Devices: pressure-redistributing mattress utilized  Skin Protection:   adhesive use limited   incontinence pads utilized     Problem: COPD (Chronic Obstructive Pulmonary Disease) Comorbidity  Goal: Maintenance of COPD Symptom Control  Outcome: Progressing     Problem: Diabetes Comorbidity  Goal: Blood Glucose Level Within Targeted Range  Outcome: Progressing     Problem: Heart Failure Comorbidity  Goal: Maintenance of Heart Failure Symptom Control  Outcome: Progressing     Problem: Hypertension Comorbidity  Goal: Blood Pressure in Desired Range  Outcome: Progressing     Problem: Obstructive Sleep Apnea Risk or Actual Comorbidity Management  Goal: Unobstructed Breathing During Sleep  Outcome: Progressing     Problem: Pain Chronic (Persistent) (Comorbidity Management)  Goal: Acceptable Pain Control and Functional Ability  Outcome: Progressing     VSS, SB/NSR with HR of 50s to 60s on monitor. PRN tylenol given x1 with good effect as well as PRN oxycodone x1 with good effect. No acute event over the shift. Resting in bed comfortably at present, call bell within  reach. Family at bedside. Team is monitoring renal function while awaiting home health set up prior to discharge, possibly on 12/18.

## 2021-12-14 NOTE — Unmapped (Signed)
Physician Discharge Summary [facility]  [address] [facility]  [address]  [place] [address]  Dept: [phone]  Loc: [phone]     Identifying Information:   Barbara Huber  February 16, 1941  [id number]    Primary Care Physician: Jacquiline Doe, MD     Code Status: DNR and DNI    Admit Date: 12/06/2021    Discharge Date: 12/14/2021     Discharge To: Home    Discharge Service: [facility] - Cardiology Floor Team 1 (MEDC1)     Discharge Attending Physician: Roselle Locus Hinderliter, MD    Discharge Diagnoses:     Principal Problem:    AKI (acute kidney injury) (CMS-HCC) POA: Yes  Active Problems:    Spinal stenosis of lumbar region POA: Yes    COPD (chronic obstructive pulmonary disease) (CMS-HCC) POA: Yes    Sleep apnea in adult POA: Yes    Essential hypertension POA: Yes    Type 2 diabetes mellitus with stage 3b chronic kidney disease, without long-term current use of insulin (CMS-HCC) POA: Yes    Anemia in chronic kidney disease POA: Yes    Chronic kidney disease, stage III (moderate) (CMS-HCC) POA: Yes    Pulmonary hypertension (CMS-HCC) POA: Yes    Chronic prescription opiate use POA: Not Applicable    Cardiac amyloidosis (CMS-HCC) POA: Yes  Resolved Problems:    * No resolved hospital problems. [facility] Course:   Before Discharge  [ ]  make sure there is a plan for palliative care follow-up ([place] or Juleen Starr), [facility] palliative care # [phone]    Barbara Huber is an 80 y/o F w/ a PMHx of [name] (iso cardiac amyloidosis), pAF (s/p DCCV 12/2020 w/ conversion to NSR, on Eliquis), [name] IV, T2DM (A1c 7.0 11/2021), IDA, COPD (on home O2 3.5L), and pHTN who presents for management of fatigue, new junctional rhythm, worsening AKI, troponin elevation, and possible volume overload.    [name] (LVEF 55%, GIIDD) -- Cardiac Amyloidosis:   She's had issues with hypotension on ARB and BB, as well as atrial arrhythmias. SPEP/UPEP/free light chains without concern. 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 Recently hospitalized and discharged on 12/6 for [name] exacerbation managed with [name] and metolazone. Today BNP is elevated to 854, which is elevated from prior.  On 11/29/2021 BNP was 622, on 11/14/2021, BNP was 301.  Last echo on 03/2021 showed LVEF greater than 55% and grade 2 diastolic dysfunction.  Dry weight is 214 pounds.  Interestingly, as of 12/03/2021, patient was at her dry weight of 213 pounds.  Despite lack of overt volume overload signs and symptoms, given elevated BNP, subjective fatigue, and worsening AKI, feel that patient does need continued diuresis. Elevate troponin trended down. Variabilities in troponin is expected with amyloidosis. Given that patient did not have any ACS symptoms, will stop trending. Continued spironolactone. gave one dose of lasix and metolazone, but did not respond. Redosed with 120 IV lasix. Continued tafamidis daily. Reached goal of net negative 1 L. After that, put her on standing dose of Lasix 120 BID, in addition to metolazone 10 daily. TTE showed LVEF of >70%. LV with severely increased wall thickness, G3DD, RV with normal systolic function, elevated Right atrial pressure. Before discharge she was switched to PO diuresis and given the increasing Cr, was given a diuretic holiday for a day. Cr improved, so she was discharged  on 40 torsemide BID and metolazone 5 PRN. She will have follow up with PCP and diuresis clinic next week.      Paroxysmal AF -- Atrial Fibrillation -- New Junctional Rhythm:  Paroxysmal atrial fibrillation, most recently has remained in normal sinus rhythm but as of this hospitalization is in a junctional rhythm.  On amiodarone and apixaban at home. CHA2DS2-VASc score = 5 (1-gender, 2-Age, 1-DM, 1-HTN).  She did have a cardioversion on 01/10/79 and was in normal sinus rhythm afterwards. EKG showed sinus rhythm with 1st degree AV block and low voltage Q waves.  Continue Eliquis 5 mg BID when appropriate, held when patient had bloody stools.        AKI on [name] IV:  GFR 10 on admission, acutely worsened.  Creatinine 4.60, up from 2.5 on 12/02/2021.  BUN elevated to 87, up from 67.  BUN/creatinine ratio is 20, suggesting possible prerenal/cardiorenal etiology, although because of acute rise given stabilization with discharge from hospital 4 days ago is unclear.  Patient is producing urine, although decreased from prior.  Has a pure wick in place. Renal US unremarkable. Calculate Fena at 1.6%, saying intrinsic damage, but most likely it is pre-renal AKi on top of ongoing [name].   - Avoid nephrotoxic agents  - Trend BMP, Phos  - Holding gabapentin given the AKI     T2DM (HbA1c 7.0%):  Patient is reporting increased blood sugar readings at home - was previously on metformin in 2021 but this was discontinued given normal A1c.  Hemoglobin A1c is newly elevated to 7.0 from 5.7 previously, last 05/2020 in the system.  - SSI  - Restart metformin on discharge  - Continue Statin     Chronic Microcytic Anemia -- Iron Deficiency Anemia:  Hemoglobin at baseline of around 8.8, likely related to [name] 4, AoCD, and iron deficiency anemia.  Has received IV iron in the past. Iron at 12 and saturation at less than 4%  - Trend CBC     Group 2 pHTN:  Followed by Dr. [name], [facility] pulmonology, thought to be secondary to either [name] or COPD.  - ctm     COPD -- Chronic Hypoxemic Respiratory Failure:   On 3L at home at baseline. No evidence of exacerbation at this time.  Arrived to floor on 4 L nasal cannula.    - Home nebs  - Albuterol as needed  - IS    Hematochizia -- Chronic Microcytic Anemia -- Iron Deficiency Anemia:  Two bloody bowel movements noted night of 12/12. Hemoglobin 12/13 down to 7.3 from baseline of around 8.8. Patient received IV iron 12/13, hemoglobin 12/15 up to 8.5. Blood per rectum thought most likely to be the result of increased dosing of Eliquis (5 mg up from home dose of 2.5 mg BID). Chronically low hemoglobin likely related to [name] 4, AoCD, and iron deficiency anemia. Has received IV iron in the past. Iron at 12 and saturation at less than 4%. Has not had another blood stool as of 12/17. Hg Stable at 9.1.  Continue holding Eliquis and defer to outpatient setting.     Procedures:  None  ______________________________________________________________________  Discharge Medications:        Your Medication List      STOP taking these medications    apixaban 5 mg Tab  Commonly known as: ELIQUIS     gabapentin 300 MG capsule  Commonly known as: NEURONTIN        START taking these medications    ondansetron  4 MG tablet  Commonly known as: ZOFRAN  Take 1 tablet (4 mg total) by mouth every eight (8) hours as needed for up to 10 days.        CHANGE how you take these medications    amiodarone 200 MG tablet  Commonly known as: PACERONE  Take 0.5 tablets (100 mg total) by mouth daily.  What changed: how much to take     torsemide 10 MG tablet  Commonly known as: DEMADEX  Take 4 tablets (40 mg total) by mouth two (2) times a day.  What changed:   ?? medication strength  ?? See the new instructions.        CONTINUE taking these medications    ACCU-CHEK AVIVA PLUS TEST STRP Strp  Generic drug: blood sugar diagnostic  UESE TO CHECK BLOOD SUGAR 3 TIMES A DAY BEFORE MEALS     acetaminophen 500 MG tablet  Commonly known as: TYLENOL  Take 1,000 mg by mouth daily as needed for pain.     ADVAIR HFA 115-21 mcg/actuation inhaler  Generic drug: fluticasone propion-salmeteroL  INHALE TWO PUFFS BY MOUTH TWICE A DAY     albuterol 90 mcg/actuation inhaler  Commonly known as: PROVENTIL HFA;VENTOLIN HFA  Inhale 2 puffs every eight (8) hours as needed for wheezing.     cranberry 500 mg Cap  Take 500 mg by mouth daily with evening meal.     estradioL 0.01 % (0.1 mg/gram) vaginal cream  Commonly known as: ESTRACE  INSERT ONE GRAM VAGINALLY AT BEDTIME     fosfomycin 3 gram Pack  Commonly known as: MONUROL  Take 3 g by mouth once a week.     lancets Misc  1 each by Miscellaneous route daily. Accu Check     metOLazone 5 MG tablet  Commonly known as: ZAROXOLYN  Take 1 tablet (5 mg total) by mouth daily as needed (when instructed by cardiology clinic).     montelukast 10 mg tablet  Commonly known as: SINGULAIR  Take 1 tablet (10 mg total) by mouth daily.     NARCAN 4 mg/actuation nasal spray  Generic drug: naloxone  1 spray into alternating nostrils once as needed (opioid overdose). PRN - Emergency use.     NON FORMULARY  APPLY FROM NECK DOWN TWICE A DAY     oxyCODONE 10 mg Tr12 12 hr crush resistant ER/CR tablet  Commonly known as: OxyCONTIN  Take 10 mg by mouth every twelve (12) hours.     OXYGEN-AIR DELIVERY SYSTEMS MISC  5 L by Miscellaneous route. Currently using 3  L/min via [place]     pantoprazole 20 MG tablet  Commonly known as: PROTONIX  Take 1 tablet (20 mg total) by mouth in the morning.     rosuvastatin 5 MG tablet  Commonly known as: CRESTOR  Take 1 tablet (5 mg total) by mouth every other day.     SHINGRIX (PF) 50 mcg/0.5 mL Susr injection  Generic drug: varicella-zoster [id number] (PF)  Inject 0.5 mL into the muscle.     SPIRIVA RESPIMAT 2.5 mcg/actuation inhalation mist  Generic drug: tiotropium bromide  INHALE TWO PUFFS BY MOUTH ONCE DAILY     spironolactone 25 MG tablet  Commonly known as: ALDACTONE  Take 25 mg by mouth in the morning.     VYNDAMAX 61 mg Cap  Generic drug: tafamidis  Take 1 capsule (61 mg) by mouth daily.            Allergies:  Nitrofurantoin  and Lipitor [atorvastatin]  ______________________________________________________________________  Pending Test Results:      Most Recent Labs:  All lab results last 24 hours -   Recent Results (from the past 24 hour(s))   CBC    Collection Time: 12/14/21  7:55 AM   Result Value Ref Range    WBC 9.4 3.6 - 11.2 10*9/L    RBC 4.00 3.95 - 5.13 10*12/L    HGB 9.3 (L) 11.3 - 14.9 g/dL    HCT 78.2 (L) 95.6 - 44.0 %    MCV 75.9 (L) 77.6 - 95.7 fL    MCH 23.3 (L) 25.9 - 32.4 pg    MCHC 30.7 (L) 32.0 - 36.0 g/dL RDW 21.3 (H) 08.6 - 57.8 %    MPV 8.1 6.8 - 10.7 fL    Platelet 363 150 - 450 10*9/L   Basic Metabolic Panel    Collection Time: 12/14/21  7:55 AM   Result Value Ref Range    Sodium 128 (L) 135 - 145 mmol/L    Potassium 4.0 3.4 - 4.8 mmol/L    Chloride 87 (L) 98 - 107 mmol/L    CO2 29.0 20.0 - 31.0 mmol/L    Anion Gap 12 5 - 14 mmol/L    BUN 90 (H) 9 - 23 mg/dL    Creatinine 4.69 (H) 0.60 - 0.80 mg/dL    BUN/Creatinine Ratio 21     eGFR [name]-EPI (2021) Female 10 (L) >=60 mL/min/1.69m2    Glucose 165 70 - 179 mg/dL    Calcium 9.6 8.7 - 62.9 mg/dL   Magnesium Level    Collection Time: 12/14/21  7:55 AM   Result Value Ref Range    Magnesium 3.5 (H) 1.6 - 2.6 mg/dL   Phosphorus Level    Collection Time: 12/14/21  7:55 AM   Result Value Ref Range    Phosphorus 5.0 2.4 - 5.1 mg/dL     CBC -   Results in Past 2 Days  Result Component Current Result   WBC 9.4 (12/14/2021)   RBC 4.00 (12/14/2021)   HGB 9.3 (L) (12/14/2021)   HCT 30.4 (L) (12/14/2021)   MCV 75.9 (L) (12/14/2021)   MCH 23.3 (L) (12/14/2021)   MCHC 30.7 (L) (12/14/2021)   MPV 8.1 (12/14/2021)   Platelet 363 (12/14/2021)     BMP -   Results in Past 2 Days  Result Component Current Result   Sodium 128 (L) (12/14/2021)   Potassium 4.0 (12/14/2021)   Chloride 87 (L) (12/14/2021)   CO2 29.0 (12/14/2021)   BUN 90 (H) (12/14/2021)   Creatinine 4.34 (H) (12/14/2021)   EST.GFR (MDRD) Not in Time Range   Glucose 165 (12/14/2021)     Cardiac markers -   No results found for requested labs within last 2 days.     LFT's -   No results found for requested labs within last 2 days.       Relevant Studies/Radiology:  ECG 12 Lead    Result Date: 12/08/2021  SINUS BRADYCARDIA WITH SINUS ARRHYTHMIA WITH 1ST DEGREE AV BLOCK LOW VOLTAGE QRS CANNOT RULE OUT ANTERIOR INFARCT  (CITED ON OR BEFORE 07-Dec-2021) ABNORMAL ECG WHEN COMPARED WITH ECG OF 06-Dec-2021 16:01, SINUS RHYTHM HAS REPLACED JUNCTIONAL RHYTHM QUESTIONABLE CHANGE IN QRS AXIS QT HAS LENGTHENED Confirmed by Warnell Forester ([id number]) on 12/08/2021 3:06:58 PM    ECG 12 Lead    Result Date: 12/06/2021  JUNCTIONAL RHYTHM RIGHTWARD AXIS LOW VOLTAGE QRS ABNORMAL ECG WHEN COMPARED WITH ECG OF 06-Dec-2021 13:13,  NO SIGNIFICANT CHANGE WAS FOUND Confirmed by Vickey Huger [phone]) on 12/06/2021 10:33:42 PM    ECG 12 lead (Adult)    Result Date: 12/06/2021  JUNCTIONAL RHYTHM RIGHTWARD AXIS LOW VOLTAGE QRS ABNORMAL ECG WHEN COMPARED WITH ECG OF 29-Nov-2021 20:51, JUNCTIONAL RHYTHM HAS REPLACED SINUS RHYTHM Confirmed by Vickey Huger [phone]) on 12/06/2021 10:27:08 PM    XR Chest 1 view Portable    Result Date: 12/06/2021  EXAM: XR CHEST PORTABLE DATE: 12/06/2021 2:27 PM ACCESSION: [id number] UN DICTATED: 12/06/2021 3:42 PM INTERPRETATION LOCATION: [facility] CLINICAL INDICATION: 80 years old Female with SHORTNESS OF BREATH  COMPARISON: Chest radiograph 11/29/2021 TECHNIQUE: Portable Chest Radiograph. FINDINGS: Similar heterogeneous bibasilar opacities, likely atelectasis. Persistent pulmonary vascular congestion with increased mild pulmonary edema. Trace bilateral pleural effusions. No pneumothorax. Stable enlarged cardiomediastinal silhouette. Calcifications.     Mildly increased pulmonary edema.    Echocardiogram W Colorflow Spectral Doppler    Result Date: 12/11/2021  Patient Info Name:     Barbara Huber Age:     58 years DOB:     Sep 19, 1941 Gender:     Female MRN:     [id number] Accession #:     [id number] UN Ht:     168 cm Wt:     96 kg BSA:     2.15 m2 BP:     115 /     50 mmHg Technical Quality:     Fair Exam Date:     12/09/2021 8:27 AM Site Location:     [facility] Exam Location:     [facility] Admit Date:     12/06/2021 Exam Type:     ECHOCARDIOGRAM W COLORFLOW SPECTRAL DOPPLER Study Info Indications      - hfpef amyloid Complete two-dimensional, color flow and Doppler transthoracic echocardiogram is performed.   Strain analysis performed. Staff Referring Physician:     Carilyn Goodpasture ; Reading Fellow:     Elana Alm MD Sonographer:     Bryson Dames Ordering Physician:     Kerin Perna Account #:     [id number] Summary   1. The left ventricle is normal in size with severely increased wall thickness.   2. The left ventricular systolic function is hyperdynamic, LVEF is visually estimated at >70%.   3. There is grade III diastolic dysfunction (severely elevated filling pressure).   4. The left atrium is moderately to severely dilated in size.   5. The right ventricle is mildly dilated in size, with normal systolic function.   6. The right atrium is mildly dilated  in size.   7. IVC size and inspiratory change suggest elevated right atrial pressure. (10-20 mmHg). Left Ventricle   The left ventricle is normal in size with severely increased wall thickness.   The left ventricular systolic function is hyperdynamic, LVEF is visually estimated at >70%.   There is grade III diastolic dysfunction (severely elevated filling pressure).   Speckled appearance of the left ventricular myocardium consistent with patient's known history of cardiac amyloidosis.   The LV global longitudinal strain tracking is suboptimal to accurately report the GLS measurement. Right Ventricle   The right ventricle is mildly dilated in size, with normal systolic function. Left Atrium   The left atrium is moderately to severely dilated in size. Right Atrium   The right atrium is mildly dilated  in size. Aortic Valve   The aortic valve is trileaflet with normal appearing leaflets with normal excursion.   There is no significant aortic regurgitation.   There is  no evidence of a significant transvalvular gradient. Pulmonic Valve   The pulmonic valve is poorly visualized, but probably normal.   There is no significant pulmonic regurgitation.   There is no evidence of a significant transvalvular gradient. Mitral Valve   The mitral valve leaflets are normal with normal leaflet mobility.   Mitral annular calcification is present (moderate).   There is no significant mitral valve regurgitation. Tricuspid Valve   The tricuspid valve leaflets are normal, with normal leaflet mobility.   There is mild tricuspid regurgitation.   The pulmonary systolic pressure cannot be estimated due to insufficient TR signal. Other Findings   Rhythm: Sinus Rhythm. Pericardium/Pleural   There is no pericardial effusion. Inferior Vena Cava   IVC size and inspiratory change suggest elevated right atrial pressure. (10-20 mmHg). Aorta   The aorta is normal in size in the visualized segments. Left Ventricular Outflow Tract ---------------------------------------------------------------------- Name                                 Value        Normal ---------------------------------------------------------------------- LVOT 2D ---------------------------------------------------------------------- LVOT Diameter                       1.9 cm               LVOT Area                          2.8 cm2               LVOT Doppler ---------------------------------------------------------------------- LVOT Peak Velocity                 1.5 m/s               LVOT VTI                             33 cm               LVOT Stroke Volume                   93 ml Pulmonic Valve ---------------------------------------------------------------------- Name                                 Value        Normal ---------------------------------------------------------------------- PV Doppler ---------------------------------------------------------------------- PV Peak Velocity                   1.1 m/s Mitral Valve ---------------------------------------------------------------------- Name                                 Value        Normal ---------------------------------------------------------------------- MV Diastolic Function ---------------------------------------------------------------------- MV E Peak Velocity                102 cm/s               MV A Peak Velocity                 37 cm/s               MV E/A 2.7  MV Annular TDI ---------------------------------------------------------------------- MV Septal e' Velocity             4.4 cm/s         >=8.0 MV Lateral e' Velocity            4.7 cm/s        >=10.0 MV e' Average                          4.5               MV E/e' (Average)                     22.5 Tricuspid Valve ---------------------------------------------------------------------- Name                                 Value        Normal ---------------------------------------------------------------------- Estimated PAP/RSVP ---------------------------------------------------------------------- RA Pressure                        15 mmHg           <=5 Aorta ---------------------------------------------------------------------- Name                                 Value        Normal ---------------------------------------------------------------------- Ascending Aorta ---------------------------------------------------------------------- Ao Root Diameter (2D)               3.6 cm               Ao Root Diam Index (2D)         16.7 cm/m2 Aortic Valve ---------------------------------------------------------------------- Name                                 Value        Normal ---------------------------------------------------------------------- AV Doppler ---------------------------------------------------------------------- AV Peak Velocity                   1.7 m/s               AV Mean Gradient                    7 mmHg               AV VTI                               34 cm               AV Area (Cont Eq VTI)              2.7 cm2         >=3.0 AV Area Index (Cont Eq VTI)     1.3 cm2/m2               AV Area (Cont Eq Vel)              2.4 cm2               AV Area Index (Cont Eq Vel)     1.1 cm2/m2               AV V1/V2 Ratio  0.86 Ventricles ---------------------------------------------------------------------- Name Value        Normal ---------------------------------------------------------------------- LV Dimensions 2D/MM ---------------------------------------------------------------------- IVS Diastolic Thickness (2D)        1.6 cm       0.6-0.9 LVID Diastole (2D)                  4.3 cm       3.8-5.2 LVPW Diastolic Thickness (2D)                                1.6 cm       0.6-0.9 LVID Systole (2D)                   2.8 cm       2.2-3.5 LVOT Diameter                       1.9 cm               RV Dimensions 2D/MM ---------------------------------------------------------------------- RV Basal Diastolic Dimension        4.4 cm       2.5-4.1 TAPSE                               1.5 cm         >=1.7 Atria ---------------------------------------------------------------------- Name                                 Value        Normal ---------------------------------------------------------------------- LA Dimensions ---------------------------------------------------------------------- LA Dimension (2D)                   4.6 cm       2.7-3.8 LA Volume (BP MOD)                   84 ml               LA Volume Index (BP MOD)       38.86 ml/m2   16.00-34.00 RA Dimensions ---------------------------------------------------------------------- RA Area (4C)                      18.1 cm2        <=18.0 RA Area (4C) Index              8.4 cm2/m2 QLAB ---------------------------------------------------------------------- Name                                 Value        Normal ---------------------------------------------------------------------- AutoStrain ---------------------------------------------------------------------- LV A2C Peak Strain                 -19.9 %               LV A3C Peak Strain                 -16.4 %               LV A4C Peak Strain                 -17.9 %               LV Peak GLS                        -  18.1 % Report Signatures Finalized by Debby Freiberg  MD on 12/11/2021 12:17 PM Resident Lorenda Hatchet  MD on 12/09/2021 10:20 AM    US Renal Complete    Result Date: 12/07/2021  EXAM: US RENAL COMPLETE DATE: 12/07/2021 1:46 AM ACCESSION: [id number] UN DICTATED: 12/07/2021 2:03 AM INTERPRETATION LOCATION: [facility] CLINICAL INDICATION: 80 years old Female with worsening AKI on [name]  COMPARISON: CT scan 08/16/2011, prior ultrasound from 09/29/2020 TECHNIQUE: Static and cine images of the kidneys and bladder were performed. FINDINGS: KIDNEYS: Kidneys are normal in size. Right kidney measures 10.9 cm left kidney measures 10.7 cm. Cortical echogenicity is mildly increased with enhanced cortical medullary differentiation. This can be seen with medical renal disease. Previously seen small stone in the right interpolar kidney not clearly identified. There is a small right upper pole cortical cyst. There is mild distention of the right renal pelvis, seen on previous CT from 08/15/2021. This is unchanged. No hydronephrosis on the left. BLADDER: Urinary bladder is overall smooth in contour. Calculated volume 278 mL.     -No hydronephrosis. Mild distention of the right renal pelvis is similar to prior CT. -Mildly increased echogenicity of the kidneys, which is nonspecific, but Barbara be related to medical renal disease.     ______________________________________________________________________  Discharge Instructions:   Activity Instructions     Activity as tolerated            Diet Instructions     Discharge diet (specify)      Discharge Nutrition Therapy: Regular         You were admitted to [facility] for volume overload. We continued diuresis for you to remove some fluid and make it easier for you to breath. At the same time we monitored your kidney function. After diuresis, Creatinine was trending up but we gave break to your kidneys by giving a diuretic holiday and your creatinine started to decrease and go in a right direction. You had some bloody stools during hospitalization, we monitored your hemoglobin, and also held Eliquis. You Hg is stable now and we are deferring the decision about restarting anticoagulation as appropriate to your primary care doctor. Your arhythmia, atrial fibrillation controlled by medication. You are stable, ready to go home with home health. It is important to follow up with diuretic clinic. Appointment is already scheduled for you. It is important to follow up with Dr. Brooke Dare. Appointment is already scheduled for you. Here are the medication instructions: CONTINUE taking amiodarone 100 oral daily CONTINUE taking torsemide 40 mg twice a day CONTINUE taking metolazone 5 as needed, if you think you are volume overloaded CONTINUE taking spironolactone 25 mg daily CONTINUE taking rosuvastatin 5 mg every other day CONTINUE taking pantoprazole 20 mg daily CONTINUE using SPIRIVA, Albuterol, montelukast and home inhalers CONTINUE taking tafamidis 61 mg daily Please carefully read and follow these instructions below upon your discharge: 1) Please take your medications as prescribed and note the changes listed on your discharge. At future follow-up appointments, please be sure to take all of your medications with you so your provider can better guide your care. 2) Seek medical care with your primary care doctor or local Emergency Room or Urgent Care if you develop any changes in your mental status, worsening abdominal pain, fevers greater than 101.5, any unexplained/unrelieved shortness of breath, uncontrolled nausea and vomiting that keeps you from remaining hydrated or taking your medication, or any other concerning symptoms. 3) Please go to your follow-up appointments. Some of your follow-up  appointments have been listed below. If you do not see an appointment listed below with your primary care doctor, please call your doctor's office as soon as possible to schedule an appointment to be seen within 7-10 days of discharge. 4) If you have any concerns before you are able to follow-up with your primary care doctor, you can reach [place] by calling [phone] and asking to page the Cardiology Team 1 resident on call.      Resources and Referrals    Home Health Referral:    [facility]: [phone]    Start of care for Home Health services will begin within 48 hours of discharge. You will receive a call from the Home Health agency to schedule the first visit.            Follow Up instructions and Outpatient Referrals     Ambulatory referral to Home Health      Is this a [facility] or [facility] Patient?: Yes    Do you want agency provider parameter notifications or patient specific   provider parameter notifications?: Agency    Do you want to initiate remote patient monitoring?: Yes    Physician to follow patient's care: PCP    Disciplines requested:  Physical Therapy  Occupational Therapy       Physical Therapy requested:  Home safety evaluation  Evaluate and treat  Strengthening exercises       Occupational Therapy Requested:  Home safety evaluation  Evaluate and treat  Strengthening exercises       Discharge instructions          Appointments which have been scheduled for you    Dec 15, 2021  6:30 PM  PTA - HOME VISIT with Richardean Canal, PTA  [facility] AND HOSPICE [facility]) [address]  [address]  [place] [address]  [phone]      Dec 16, 2021  9:00 AM  (Arrive by 8:45 AM)  DIURESIS HF [place] with DIURESIS HF NP [facility]  [facility] CARDIOLOGY [place] [facility]) [address]  [place] [address]  [phone]      Dec 17, 2021 To Be Determined  PT - REASSESSMENT with Stanford Scotland, PT  [facility] AND HOSPICE [facility]) [address]  [address]  [place] [address]  [phone]      Dec 17, 2021 11:10 AM  (Arrive by 10:55 AM)  RETURN CONTINUITY with Artelia Laroche, MD  [facility] INTERNAL MEDICINE [place] [facility]) [address]  [place] [address]  [phone]      Dec 18, 2021 10:00 AM  (Arrive by 9:45 AM)  RETURN VIDEO HCP MYCHART with Chriss Driver, MD  [facility] INFECTIOUS DISEASES [place] [facility]) [address]  [place] [address]  [phone]   Please sign into My [place] Chart at least 15 minutes before your appointment to complete the eCheck-In process. You must complete eCheck-In before you can start your video visit. We also recommend testing your audio and video connection to troubleshoot any issues before your visit begins. Click ???Join Video Visit??? to complete these checks. Once you have completed eCheck-In and tested your audio and video, click ???Join Call??? to connect to your visit.     For your video visit, you will need a computer with  a working Warden/ranger, speaker and microphone, a smartphone, or a tablet with internet access.    My [place] Chart enables you to manage your health, send non-urgent messages to your provider, view your test results, schedule and manage appointments, and request prescription refills securely and conveniently from your computer or mobile device.    You can go to [email or web address] to sign in to your My [place] Chart account with your username and password. If you have forgotten your username or password, please choose the ???Forgot Username???? and/or ???Forgot Password???? links to gain access. You also can access your My [place] Chart account with the free MyChart mobile app for Android or iPhone.    If you need assistance accessing your My [place] Chart account or for assistance in reaching your provider's office to reschedule or cancel your appointment, please call [facility] [phone].       Dec 22, 2021 To Be Determined  PTA - HOME VISIT with Richardean Canal, PTA  [facility] AND HOSPICE [facility]) [address]  [address]  [place] [address]  [phone]      Jan 07, 2022  3:15 PM  (Arrive by 3:00 PM)  NEW NEUROLOGY with Enedina Finner, MD  [facility] INTERNAL MEDICINE [place] [facility]) [address]  [place] [address]  [phone]      Jan 16, 2022 11:30 AM  (Arrive by 11:15 AM)  RETURN HEART FAILURE [place] with Carin Hock, MD  [facility] CARDIOLOGY [place] [facility]) [address]  [place] [address]  [phone]      Jan 22, 2022  1:30 PM  (Arrive by 1:15 PM)  RETURN  GENERAL with Abhijit Lonia Blood, MD  [facility] KIDNEY SPECIALTY AND TRANSPLANT CLINIC [place] [facility]) [address]  [place] [address]  [phone]      Feb 02, 2022  2:00 PM  (Arrive by 1:45 PM)  XR DEXA BONE DENSITY SKELETAL with HBR DEXA RM 1  IMG DEXA HBR [facility]) [address]  [place] [address]  [phone]   No calcium supplements 24 hrs prior.     Feb 19, 2022  1:15 PM  (Arrive by 1:00 PM)  RETURN CONTINUITY with Artelia Laroche, MD  [facility] INTERNAL MEDICINE [place] [facility]) [address]  [place] [address]  [phone]      Barbara 12, 2023  1:30 PM  (Arrive by 1:15 PM)  RETURN  PULM HYPERTENSION with Zannie Cove, MD  [facility] PULMONARY SPECIALTY CL [place] [facility]) [address]  [place] [address]  [phone]           ______________________________________________________________________  Discharge Day Services:  BP 130/56  - Pulse 68  - Temp 36.8 ??C (98.2 ??F) (Oral)  - Resp 18  - Ht 167.6 cm (5' 6)  - Wt 93.3 kg (205 lb 11 oz)  - SpO2 96%  - BMI 33.20 kg/m??     Pt seen on the day of discharge and determined appropriate for discharge.    Condition at Discharge: fair    Length of Discharge: I spent greater than 30 mins in the discharge of this patient.    Yehuda Savannah, MD  PGY1, Internal Medicine

## 2021-12-15 DIAGNOSIS — Z09 Encounter for follow-up examination after completed treatment for conditions other than malignant neoplasm: Principal | ICD-10-CM

## 2021-12-16 ENCOUNTER — Ambulatory Visit: Admit: 2021-12-16 | Discharge: 2021-12-17 | Payer: MEDICARE | Attending: Adult Health | Primary: Adult Health

## 2021-12-16 DIAGNOSIS — J9611 Chronic respiratory failure with hypoxia: Principal | ICD-10-CM

## 2021-12-16 DIAGNOSIS — I43 Cardiomyopathy in diseases classified elsewhere: Principal | ICD-10-CM

## 2021-12-16 DIAGNOSIS — I10 Essential (primary) hypertension: Principal | ICD-10-CM

## 2021-12-16 DIAGNOSIS — I5032 Chronic diastolic (congestive) heart failure: Principal | ICD-10-CM

## 2021-12-16 DIAGNOSIS — I4819 Other persistent atrial fibrillation: Principal | ICD-10-CM

## 2021-12-16 DIAGNOSIS — E854 Organ-limited amyloidosis: Principal | ICD-10-CM

## 2021-12-16 LAB — BASIC METABOLIC PANEL
ANION GAP: 12 mmol/L (ref 5–14)
BLOOD UREA NITROGEN: 92 mg/dL — ABNORMAL HIGH (ref 9–23)
BUN / CREAT RATIO: 20
CALCIUM: 10.1 mg/dL (ref 8.7–10.4)
CHLORIDE: 85 mmol/L — ABNORMAL LOW (ref 98–107)
CO2: 31.9 mmol/L — ABNORMAL HIGH (ref 20.0–31.0)
CREATININE: 4.66 mg/dL — ABNORMAL HIGH
EGFR CKD-EPI (2021) FEMALE: 9 mL/min/{1.73_m2} — ABNORMAL LOW (ref >=60)
GLUCOSE RANDOM: 212 mg/dL — ABNORMAL HIGH (ref 70–179)
POTASSIUM: 3.8 mmol/L (ref 3.4–4.8)
SODIUM: 129 mmol/L — ABNORMAL LOW (ref 135–145)

## 2021-12-16 LAB — MAGNESIUM: MAGNESIUM: 3.4 mg/dL — ABNORMAL HIGH (ref 1.6–2.6)

## 2021-12-16 LAB — B-TYPE NATRIURETIC PEPTIDE: B-TYPE NATRIURETIC PEPTIDE: 223.36 pg/mL — ABNORMAL HIGH (ref <=100)

## 2021-12-16 MED ORDER — GABAPENTIN 100 MG CAPSULE
ORAL_CAPSULE | Freq: Every evening | ORAL | 3 refills | 90.00000 days | Status: CP
Start: 2021-12-16 — End: 2022-12-16

## 2021-12-16 NOTE — Unmapped (Addendum)
[name] Clinic Discharge Instructions:    MEDICATIONS:  We are changing your medications today.  - OK to try starting back gabapentin 100mg  nightly. If you get a lot of daytime fatigue, I would stop it again  - Try melatonin at bedtime  - Skip the second dose of torsemide today, then i'll let you know about dosing going forward   Call if you have questions about your medications.    IMPORTANT INSTRUCTIONS:  - Weigh yourself daily in the morning; write your weight down and bring it with you next time   - Limit your fluid intake to 2 Liters (half-gallon) per day    - Limit your salt intake to 2-3 grams (2000-3000 mg) per day  - If you have a morning diuresis appointment, don't take your morning diuretic  - If you have an afternoon diuresis appointment, take your morning diuretic    LABS:  We will call you if your labs need attention    NEXT APPOINTMENT:  2 weeks with me  Return to clinic in 1  month  with  Dr Wonda Cheng      My office number (c/o De Burrs RN) is [phone] if you need further assistance, or need to schedule with our [name] clinic in the future.    If you need to reschedule future appointments, please call [phone].   After office hours, if you have urgent questions/problems, contact the on-call cardiologist through the hospital operator: [phone].    Please do not send a MyChart message for potentially life-threatening symptoms or time-sensitive issues. MyChart messages are not monitored after normal business hours or on weekends. Please call [phone] for a true medical emergency.

## 2021-12-16 NOTE — Unmapped (Signed)
[facility] HF Amyloid Clinic Note    Referring Provider: Artelia Laroche, MD  [address]  [address]  [place],  [place] [address]   Primary Provider: Jacquiline Doe, MD  [address]  [place] [address]   Other Providers:  Dr Luretha Murphy    Reason for Visit:  Barbara Huber is a 80 y.o. female being seen for hospital follow up.    Assessment & Plan:  1. 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   - Moderately increased wall thickness with low voltage ECG - she's had issues with hypotension on ARB and BB, as well as atrial arrhythmias. SPEP/UPEP/free light chains without concern. 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 She has one daughter who accompanies her and is informed.   - DC'd Jardiance 10mg  daily due to recurrent UTIs  - She appears dry on exam today. No LE edema, JVP not elevated, tenting skin, and significant weight loss from prior. Some weight loss could be body weight loss. Creatinine remains significantly elevated above baseline at 4.66, BUN 92. Hold torsemide x2 days then restart 40mg  daily with watch on symptoms/weights. In review of most recent admissions, suspect she could have been over-diuresed during admission 12/3-12/6, leading to impairment in renal function and worsening side effects from gabapentin (fatigue) which caused her to re-present on 12/10.   - Continue Tafamidis 61 mg daily   - Continue spironolactone 25 mg daily     2. Persistent atrial fibrillation  - CHA2DS2-VASc score = 5 (1-gender, 2-Age, 1-DM, 1-HTN)  - holding Eliquis 5mg  BID - recent blood in stool and decreased Hgb, which has since trended up to 9.3 on discharge  - She had been in afib since at least 07/2020; now s/p cardioversion 01/10/21 and continues to be in NSR since  - On amiodarone 200mg  daily - increased it back herself after seeing no improvement in tremors/jerking with decreased dose    3. COPD  - home O2 3L Max - followed by pulm  - on Advair and Spiriva    4. Iron deficiency anemia  - s/p 5 IV iron sessions   - Hgb low during admission (7.3 on 12/13). Improved to 9.3 on discharge  - continue to hold Eliquis as above until we can ensure stability of Hgb and no more blood in stool    Follow-up:  Return in about 2 weeks (around 12/30/2021) for Diuresis Clinic.    History of Present Illness:  Barbara Huber is a 80 y.o. female with PMHx of COPD on 3L home , CHF, [name], [name], T2DM??who presents today for hospital follow-up. She presented to [facility]??with Acute on Chronic Decompensated Diastolic CHF in the setting of Septic Shock d/t Gangrenous Cholecystitis now s/p cholecystectomy 10/6. She presented with 1 week of dyspnea on exertion, leg swelling and abdominal distention without chest pain. Her dry weight was 232 pounds,??238 pounds. Upon arrival to [facility], she was in afib with RVR with higher rates and softer pressures requiring NE and addition of vaso to maintain MAP>65. She was given digoxin x 1 for better rate control with mild improvement in rates but was still requiring high doses of NE as well as vaso.??Dobutamine given to augment diuresis via bumex drip and metolazone x1 dose. Etiology of pressor requirement likely due  to septic shock. Echo 10/4 showed upper-normal RV size with??reduced systolic function.??RHC to evaluate for RV dysfunction unable to be completed due to arterial placement of micropuncture sheath during procedure. She was weaned off of pressors and continued to improve with diuresis and treatment of underlying infection. At time of discharge, her acute Diastolic HF was controlled and she was restarted on home meds. For her afib, coreg was held during admission but then restarted prior to discharge.     She as seen in hospital follow-up 10/30/20. She was discharged on Lasix 40mg  BID. Her weight was 221lbs on discharge. Lasix was reduced to 20mg  daily by her PCP for low SBPs (although notes say 40mg  daily). Since reducing the Lasix, her weight crept up to 226lbs. Her belly felt  tight and she had LE edema.  At visit with PCP, her Coreg was also changed to metoprolol 25mg  and irbesartan was stopped. BPs were running 110s/70-90s. HR running around 100-110. Her metoprolol was increased to 50mg  for rate control. Lasix was increased to 40mg  daily.    She was seen 11/18 and her weight was been running 224-227lbs. She has low energy and since increasing metoprolol she notes worsening of her symptoms. She feels bloated and has LE edema. She is not back to her usual activities, like fixing her food or cooking. Her BP is running 100-120s/70-80s.     PYP scan showed TTR amyloid. She then saw Dr Luretha Murphy 12/3 and was feeling ok but somewhat volume up.   We met her on 1/7. Metoprolol was decreased to 50mg  daily and she was started on amiodarone. She unfortunately was hospitalized 1/11-1/15 for 3 days of worsening shortness of breath and fatigue.  She was found to have AKI and oliguria in the setting of increased beta blocker as an outpatient resulting in decreased cardiac filling/output. She was treated with gentle diuresis and removal of beta blockade with subsequent improvement in blood pressure and renal function. She underwent RHC with showed elevated filling pressures, normal CO, and restrictive filling pattern. She had a DCCV 1/14 with resolution of atrial fibrillation. She was continued on amiodarone.    As of 03/21/21, she has been feeling well. Her weight has been stable on 40 mg torsemide BID. She has not had any more UTIs and her DOE has been back to when she was feeling well. She has more energy and appetite is a bit better too. Her breathing and energy are good. Her HR has been very good and so has her BP.    Since her last visit in clinic 03/21/21,  she has had multiple admissions: 4/22 for weakness/urosepsis, 4/27 for shortness of breath/orthopnea/weight gain, 6/7 for lightheadedness/SOB/Blurry vision found to be anemic. She also had two recent [name] 6/17 for LE edema and dyspnea on exertion in the setting of recently being taken off her torsemide and spironolactone, and again 6/22 for anemia with Hgb down to 7.5. As of 6/24, she states her energy is a little better and she has more of an appetite. She has no swelling or SOB. She is sleeping fine at night without orthopnea/PND. She is taking torsemide daily still rather than every other day.     She was seen 9/2 for follow-up. She had more swelling in her legs so torsemide was increased to 40mg  qAM, 20mg  qPM.     She was last seen in late November and was feeling well over all. She is not having a lot of issues with volume retention. Weight remains  216-218lbs. It was up to 221lbs today after receiving a COVID booster on Friday. She plans to take extra torsemide this afternoon. She has a bit of sock line edema. She hasnt taken metolazone in 2 weeks. She has good energy, is cooking for herself a bit. She is having bothersome jerking/tremors in her arms/hands. She is thinking it's caused by a medicine, wondering if it's the amiodarone.       Interval History  She presents today for hospital follow-up. She was admitted to [facility] for [name] Exacerbation. She reports that her weight was around 218-219 though her understanding is that her dry weight should be around 214.Weight on admission 223lbs.??She tried taking a dose of metolazone earlier in the week and also increasing her afternoon torsemide from 20 to 40 with minimal response. She somewhat more short of breath than baseline, especially with exertion. LE edema was stable to her. On labs her BNP is up from baseline. Started on 40mg  IV Lasix BID and given  5mg  metolazone. Weight now trending down.  Creatinine increased to 3.15 from a baseline around 2.5 also with azotemia and mild hyperkalemia. Creatinine trended down with diuresis and was 2.47 on discharge. She was discharged on torsemide 40mg  qAM, 20mg  qPM. Unclear discharge weight, as 213 was documented on 12/5, 221 on 12/6, and 213 on 12/7.     She was then readmitted 12/10-12/18 for fatigue, new junctional rhythm, worsening AKI, troponin elevation, and possible volume overload.??During admission, she was at her dry weight of 213 pounds. ??Despite lack of overt volume overload signs and symptoms, given elevated BNP, subjective fatigue, and worsening AKI, she was diuresed while inpatient with Lasix 120 BID, in addition to metolazone 10 daily. TTE showed LVEF of >70%. LV with severely increased wall thickness, G3DD, RV with normal systolic function, elevated Right atrial pressure. Before discharge she was switched to PO diuresis and given the increasing Cr, was given a diuretic holiday for a day. Cr improved, so she was discharged on 40 torsemide BID and metolazone 5 PRN.     Since discharge home, her weight is down to 202lbs. She didn't really think she was retaining fluid when she went in, and now thinks she's dried out. She really didn't show signs of fluid - no SOB, was at dry weight. What led her to go to the hospital was that she was drowsy and couldn't stay awake.  She has been taking torsemide 40mg  BID since discharge. Weight is decreasing and she feels a little more lightheaded. Not sleeping due to anxiety and neuropathy pain in her feet. She is not on gabapentin at all anymore due to the renal function. She is concerned about blood sugars as they have been elevated in the mornings in the 200s and she isn't taking anything for it. She has had no more blood in her stool, taking the Eliquis. She increased amiodarone to 200mg  daily after her last visit with me, as she didn't see any improvement in tremors with the reduction to 100mg .       Cardiovascular History & Procedures:    Cath / PCI:  ?? none    CV Surgery:  ??  none    EP Procedures and Devices:  ?? none    Non-Invasive Evaluation(s):    Echo:  12/09/21  Summary    1. The left ventricle is normal in size with severely increased wall  thickness.    2. The left ventricular systolic function is hyperdynamic, LVEF is visually  estimated at >  70%.    3. There is grade III diastolic dysfunction (severely elevated filling  pressure).    4. The left atrium is moderately to severely dilated in size.    5. The right ventricle is mildly dilated in size, with normal systolic  function.    6. The right atrium is mildly dilated  in size.    7. IVC size and inspiratory change suggest elevated right atrial pressure.  (10-20 mmHg).    04/24/21   1. The left ventricle is normal in size with mildly increased wall  thickness.    2. The left ventricular systolic function is normal, LVEF is visually  estimated at > 55%.    3. There is grade II diastolic dysfunction (elevated filling pressure).    4. Mitral annular calcification is present (mild).    5. The left atrium is mildly dilated in size.    6. The right ventricle is mildly dilated in size, with low normal systolic  function.    7. IVC size and inspiratory change suggest elevated right atrial pressure.  (10-20 mmHg).    ?? 09/30/20  Summary    1. Limited study to assess systolic function.    2. IVC size and inspiratory change suggest normal right atrial pressure.  (0-5 mmHg).    3. The left ventricle is normal in size with mildly to moderately increased  wall thickness.    4. The left ventricular systolic function is normal with no obvious wall  motion abnormalities, LVEF is visually estimated at > 55%.    5. Mitral annular calcification is present.    6. The left atrium is mildly dilated in size.    7. The right ventricle is upper normal in size, with reduced systolic  Function.    Cardiac CT/MRI/Nuclear Tests:  ??  None    6 Minute Walk:  ?? None    Cardiopulmonary Stress Tests:  ??  None               Other Past Medical History:  See below for the complete EPIC list of past medical and surgical history.      Allergies:  Nitrofurantoin and Lipitor [atorvastatin]    Current Medications:  Current Outpatient Medications   Medication Sig Dispense Refill   ??? ACCU-CHEK AVIVA PLUS TEST STRP Strp UESE TO CHECK BLOOD SUGAR 3 TIMES A DAY BEFORE MEALS 100 each 11   ??? acetaminophen (TYLENOL) 500 MG tablet Take 1,000 mg by mouth daily as needed for pain.      ??? ADVAIR HFA 115-21 mcg/actuation inhaler INHALE TWO PUFFS BY MOUTH TWICE A DAY 12 g 11   ??? albuterol HFA 90 mcg/actuation inhaler Inhale 2 puffs every eight (8) hours as needed for wheezing.     ??? amiodarone (PACERONE) 200 MG tablet Take 0.5 tablets (100 mg total) by mouth daily. 45 tablet 3   ??? cranberry 500 mg cap Take 500 mg by mouth daily with evening meal.     ??? estradioL (ESTRACE) 0.01 % (0.1 mg/gram) vaginal cream INSERT ONE GRAM VAGINALLY AT BEDTIME     ??? fosfomycin (MONUROL) 3 gram Pack Take 3 g by mouth once a week. 36 g 0   ??? lancets Misc 1 each by Miscellaneous route daily. Accu Check 100 each 6   ??? metOLazone (ZAROXOLYN) 5 MG tablet Take 1 tablet (5 mg total) by mouth daily as needed (when instructed by cardiology clinic). 30 tablet 1   ??? montelukast (SINGULAIR) 10 mg tablet  Take 1 tablet (10 mg total) by mouth daily. 90 tablet 3   ??? NON FORMULARY APPLY FROM NECK DOWN TWICE A DAY     ??? ondansetron (ZOFRAN) 4 MG tablet Take 1 tablet (4 mg total) by mouth every eight (8) hours as needed for up to 10 days. 15 tablet 0   ??? oxyCODONE (OXYCONTIN) 10 mg TR12 12 hr crush resistant ER/CR tablet Take 10 mg by mouth every twelve (12) hours.     ??? OXYGEN-AIR DELIVERY SYSTEMS MISC 5 L by Miscellaneous route. Currently using 3  L/min via Index     ??? pantoprazole (PROTONIX) 20 MG tablet Take 1 tablet (20 mg total) by mouth in the morning. 60 tablet 5   ??? rosuvastatin (CRESTOR) 5 MG tablet Take 1 tablet (5 mg total) by mouth every other day. 15 tablet 11   ??? SPIRIVA RESPIMAT 2.5 mcg/actuation inhalation mist INHALE TWO PUFFS BY MOUTH ONCE DAILY 4 g 11   ??? spironolactone (ALDACTONE) 25 MG tablet Take 25 mg by mouth in the morning.     ??? tafamidis 61 mg cap Take 1 capsule (61 mg) by mouth daily. 30 capsule 11   ??? torsemide (DEMADEX) 10 MG tablet Take 4 tablets (40 mg total) by mouth two (2) times a day. 240 tablet 0   ??? gabapentin (NEURONTIN) 100 MG capsule Take 1 capsule (100 mg total) by mouth nightly. 90 capsule 3   ??? NARCAN 4 mg/actuation nasal spray 1 spray into alternating nostrils once as needed (opioid overdose). PRN - Emergency use. (Patient not taking: Reported on 12/16/2021)     ??? varicella-zoster [id number], PF, (SHINGRIX, PF,) 50 mcg/0.5 mL SusR injection Inject 0.5 mL into the muscle. (Patient not taking: Reported on 12/16/2021) 0.5 mL 1     No current facility-administered medications for this visit.       Family History:  The patient's family history includes Cancer in her father; Hypertension in her mother.    Social history:  She  reports that she quit smoking about 3 years ago. Her smoking use included cigarettes. She smoked an average of .33 packs per day. She has never used smokeless tobacco. She reports that she does not drink alcohol and does not use drugs.    Review of Systems:  As per HPI.  Rest of the review of ten systems is negative or unremarkable except as stated above.    Physical Exam:  VITAL SIGNS:   Vitals:    12/16/21 1516   BP: 129/62   Pulse: 69   SpO2: 97%      Wt Readings from Last 3 Encounters:   12/16/21 92.3 kg (203 lb 6.4 oz)   12/13/21 93.3 kg (205 lb 11 oz)   12/03/21 96.6 kg (213 lb)      Today's Body mass index is 32.83 kg/m??.      CONSTITUTIONAL: chronically ill-appearing in no acute distress  EYES: Conjunctivae and sclerae clear and anicteric.  ENT: Benign.   CARDIOVASCULAR: JVP not seen above the clavicle with HOB at 90 degrees. Rate and rhythm are regular.  There is no lifts or heaves.  Normal S1, S2. There is no murmur, gallops or rubs.  Radial and pedal pulses are 2+, bilaterally.   There is no pedal edema, bilaterally. RESPIRATORY: Normal respiratory effort. Clear to auscultation bilaterally..  There are no wheezes.  GASTROINTESTINAL: Soft, non-tender, with audible bowel sounds. Abdomen nondistended.  Liver is nonpalpable.  SKIN: No rashes, ecchymosis or petechiae.  Warm,  well perfused. Dry appearing, tenting.   MUSCULOSKELETAL:  no joint swelling   NEURO/PSYCH: Appropriate mood and affect. Alert and oriented to person, place, and time. No gross motor or sensory deficits evident.    Pertinent Laboratory Studies:   Appointment on 12/16/2021   Component Date Value Ref Range Status   ??? Sodium 12/16/2021 129 (L)  135 - 145 mmol/L Final   ??? Potassium 12/16/2021 3.8  3.4 - 4.8 mmol/L Final   ??? Chloride 12/16/2021 85 (L)  98 - 107 mmol/L Final   ??? CO2 12/16/2021 31.9 (H)  20.0 - 31.0 mmol/L Final   ??? Anion Gap 12/16/2021 12  5 - 14 mmol/L Final   ??? BUN 12/16/2021 92 (H)  9 - 23 mg/dL Final   ??? Creatinine 12/16/2021 4.66 (H)  0.60 - 0.80 mg/dL Final   ??? BUN/Creatinine Ratio 12/16/2021 20   Final   ??? eGFR [name]-EPI (2021) Female 12/16/2021 9 (L)  >=60 mL/min/1.84m2 Final   ??? Glucose 12/16/2021 212 (H)  70 - 179 mg/dL Final   ??? Calcium 16/09/9603 10.1  8.7 - 10.4 mg/dL Final   ??? BNP 12/16/2021 223.36 (H)  <=100 pg/mL Final   ??? Magnesium 12/16/2021 3.4 (H)  1.6 - 2.6 mg/dL Final   No results displayed because visit has over 200 results.      No results displayed because visit has over 200 results.      Office Visit on 11/14/2021   Component Date Value Ref Range Status   ??? Sodium 11/14/2021 138  135 - 145 mmol/L Final   ??? Potassium 11/14/2021 4.6  3.4 - 4.8 mmol/L Final   ??? Chloride 11/14/2021 97 (L)  98 - 107 mmol/L Final   ??? CO2 11/14/2021 33.2 (H)  20.0 - 31.0 mmol/L Final   ??? Anion Gap 11/14/2021 8  5 - 14 mmol/L Final   ??? BUN 11/14/2021 61 (H)  9 - 23 mg/dL Final   ??? Creatinine 11/14/2021 2.73 (H)  0.60 - 0.80 mg/dL Final   ??? BUN/Creatinine Ratio 11/14/2021 22   Final   ??? eGFR [name]-EPI (2021) Female 11/14/2021 17 (L)  >=60 mL/min/1.48m2 Final   ??? Glucose 11/14/2021 214 (H)  70 - 179 mg/dL Final   ??? Calcium 54/08/8118 9.0  8.7 - 10.4 mg/dL Final   ??? BNP 11/14/2021 300.97 (H)  <=100 pg/mL Final   ??? Sodium 11/14/2021 138  135 - 145 mmol/L Final   ??? Potassium 11/14/2021 5.0 (H)  3.4 - 4.8 mmol/L Final   ??? Chloride 11/14/2021 96 (L)  98 - 107 mmol/L Final   ??? CO2 11/14/2021 33.2 (H)  20.0 - 31.0 mmol/L Final   ??? Anion Gap 11/14/2021 9  5 - 14 mmol/L Final   ??? BUN 11/14/2021 63 (H)  9 - 23 mg/dL Final   ??? Creatinine 11/14/2021 2.77 (H)  0.60 - 0.80 mg/dL Final   ??? BUN/Creatinine Ratio 11/14/2021 23   Final   ??? eGFR [name]-EPI (2021) Female 11/14/2021 17 (L)  >=60 mL/min/1.61m2 Final   ??? Glucose 11/14/2021 217 (H)  70 - 179 mg/dL Final   ??? Calcium 14/78/2956 9.1  8.7 - 10.4 mg/dL Final   ??? WBC 21/30/8657 8.0  3.6 - 11.2 10*9/L Final   ??? RBC 11/14/2021 3.87 (L)  3.95 - 5.13 10*12/L Final   ??? HGB 11/14/2021 9.6 (L)  11.3 - 14.9 g/dL Final   ??? HCT 84/69/6295 31.2 (L)  34.0 - 44.0 % Final   ???  MCV 11/14/2021 80.5  77.6 - 95.7 fL Final   ??? MCH 11/14/2021 24.9 (L)  25.9 - 32.4 pg Final   ??? MCHC 11/14/2021 30.9 (L)  32.0 - 36.0 g/dL Final   ??? RDW 16/09/9603 17.6 (H)  12.2 - 15.2 % Final   ??? MPV 11/14/2021 8.1  6.8 - 10.7 fL Final   ??? Platelet 11/14/2021 274  150 - 450 10*9/L Final   ??? nRBC 11/14/2021 0  <=4 /100 WBCs Final   ??? Neutrophils % 11/14/2021 77.2  % Final   ??? Lymphocytes % 11/14/2021 10.2  % Final   ??? Monocytes % 11/14/2021 8.6  % Final   ??? Eosinophils % 11/14/2021 2.5  % Final   ??? Basophils % 11/14/2021 1.5  % Final   ??? Absolute Neutrophils 11/14/2021 6.2  1.8 - 7.8 10*9/L Final   ??? Absolute Lymphocytes 11/14/2021 0.8 (L)  1.1 - 3.6 10*9/L Final   ??? Absolute Monocytes 11/14/2021 0.7  0.3 - 0.8 10*9/L Final   ??? Absolute Eosinophils 11/14/2021 0.2  0.0 - 0.5 10*9/L Final   ??? Absolute Basophils 11/14/2021 0.1  0.0 - 0.1 10*9/L Final   ??? Anisocytosis 11/14/2021 Slight (A)  Not Present Final   Office Visit on 10/23/2021   Component Date Value Ref Range Status ??? Creat U 10/23/2021 44.6  Undefined mg/dL Final   ??? Protein, Ur 10/23/2021 <6.0  Undefined mg/dL Final   ??? Protein/Creatinine Ratio, Urine 10/23/2021    Final   ??? Creat U 10/23/2021 44.7  Undefined mg/dL Final   ??? Albumin Quantitative, Urine 10/23/2021 <0.3  Undefined mg/dL Final   ??? Albumin/Creatinine Ratio 10/23/2021    Final   ??? Spec Gravity/POC 10/23/2021 1.015  1.003 - 1.030 Final   ??? PH/POC 10/23/2021 6.5  5.0 - 9.0 Final   ??? Leuk Esterase/POC 10/23/2021 Negative  Negative Final   ??? Nitrite/POC 10/23/2021 Negative  Negative Final   ??? Protein/POC 10/23/2021 Negative  Negative Final   ??? UA Glucose/POC 10/23/2021 Negative  Negative Final   ??? Ketones, POC 10/23/2021 Negative  Negative Final   ??? Bilirubin/POC 10/23/2021 Negative  Negative Final   ??? Blood/POC 10/23/2021 Negative  Negative Final   ??? Urobilinogen/POC 10/23/2021 0.2  0.2 - 1.0 mg/dL Final   Office Visit on 08/27/2021   Component Date Value Ref Range Status   ??? HGB A1C, POC 08/27/2021 5.9  <7.0 % Final   ??? EST AVERAGE GLUCOSE, POC 08/27/2021 123  mg/dL Final   ??? Sodium 54/08/8118 139  135 - 145 mmol/L Final   ??? Potassium 08/27/2021 4.5  3.4 - 4.8 mmol/L Final   ??? Chloride 08/27/2021 103  98 - 107 mmol/L Final   ??? CO2 08/27/2021 30.3  20.0 - 31.0 mmol/L Final   ??? Anion Gap 08/27/2021 6  5 - 14 mmol/L Final   ??? BUN 08/27/2021 45 (H)  9 - 23 mg/dL Final   ??? Creatinine 08/27/2021 2.58 (H)  0.60 - 0.80 mg/dL Final   ??? BUN/Creatinine Ratio 08/27/2021 17   Final   ??? eGFR [name]-EPI (2021) Female 08/27/2021 18 (L)  >=60 mL/min/1.64m2 Final   ??? Glucose 08/27/2021 129  70 - 179 mg/dL Final   ??? Calcium 14/78/2956 9.0  8.7 - 10.4 mg/dL Final   ??? BNP 21/30/8657 444.77 (H)  <=100 pg/mL Final   ??? WBC 08/27/2021 6.4  3.6 - 11.2 10*9/L Final   ??? RBC 08/27/2021 3.94 (L)  3.95 - 5.13 10*12/L Final   ???  HGB 08/27/2021 9.7 (L)  11.3 - 14.9 g/dL Final   ??? HCT 16/09/9603 31.6 (L)  34.0 - 44.0 % Final   ??? MCV 08/27/2021 80.2  77.6 - 95.7 fL Final   ??? MCH 08/27/2021 24.7 (L) 25.9 - 32.4 pg Final   ??? MCHC 08/27/2021 30.8 (L)  32.0 - 36.0 g/dL Final   ??? RDW 54/08/8118 19.8 (H)  12.2 - 15.2 % Final   ??? MPV 08/27/2021 8.6  6.8 - 10.7 fL Final   ??? Platelet 08/27/2021 202  150 - 450 10*9/L Final   ??? nRBC 08/27/2021 0  <=4 /100 WBCs Final   ??? Neutrophils % 08/27/2021 69.4  % Final   ??? Lymphocytes % 08/27/2021 16.4  % Final   ??? Monocytes % 08/27/2021 8.5  % Final   ??? Eosinophils % 08/27/2021 4.6  % Final   ??? Basophils % 08/27/2021 1.1  % Final   ??? Absolute Neutrophils 08/27/2021 4.4  1.8 - 7.8 10*9/L Final   ??? Absolute Lymphocytes 08/27/2021 1.0 (L)  1.1 - 3.6 10*9/L Final   ??? Absolute Monocytes 08/27/2021 0.5  0.3 - 0.8 10*9/L Final   ??? Absolute Eosinophils 08/27/2021 0.3  0.0 - 0.5 10*9/L Final   ??? Absolute Basophils 08/27/2021 0.1  0.0 - 0.1 10*9/L Final   ??? Anisocytosis 08/27/2021 Moderate (A)  Not Present Final       Lab Results   Component Value Date    PRO-BNP 3,420.0 (H) 07/03/2021    PRO-BNP 905.0 (H) 03/21/2021    PRO-BNP 3,796.0 (H) 12/23/2020    Creatinine 4.66 (H) 12/16/2021    Creatinine 4.34 (H) 12/14/2021    Creatinine 0.79 01/06/2011    BUN 92 (H) 12/16/2021    BUN 90 (H) 12/14/2021    BUN 20 01/06/2011    Sodium 129 (L) 12/16/2021    Sodium 140 01/06/2011    Potassium 3.8 12/16/2021    Potassium 4.1 01/06/2011    CO2 31.9 (H) 12/16/2021    CO2 30 01/06/2011    Magnesium 3.4 (H) 12/16/2021    Magnesium 2.0 01/06/2011    Total Bilirubin 0.5 12/09/2021    INR 2.15 07/01/2021    INR 1.0 01/06/2011       Lab Results   Component Value Date    Digoxin Level 0.9 10/01/2020       Lab Results   Component Value Date    TSH 2.731 12/06/2021    Cholesterol 95 12/06/2021    Triglycerides 66 12/06/2021    HDL 42 12/06/2021    Non-HDL Cholesterol 53 (L) 12/06/2021    LDL Calculated 40 12/06/2021       Lab Results   Component Value Date    WBC 9.4 12/14/2021    WBC 12.5 (H) 01/06/2011    HGB 9.3 (L) 12/14/2021    HGB 12.9 01/06/2011    HCT 30.4 (L) 12/14/2021    HCT 40.8 01/06/2011    Platelet 363 12/14/2021    Platelet 260 01/06/2011       Pertinent Test Results from Today:  None    Other pertinent records were reviewed.    The following are further history from the patient's EPIC record for reference:     Past Medical History:   Diagnosis Date   ??? Acute kidney injury superimposed on chronic kidney disease (CMS-HCC) 10/11/2020   ??? Acute on chronic diastolic (congestive) heart failure (CMS-HCC) 08/23/2020   ??? Arthritis    ??? Calculus of kidney    ???  Calculus of ureter    ??? CHF (congestive heart failure) (CMS-HCC)    ??? Chronic atrial fibrillation (CMS-HCC) 07/20/2019   ??? COPD (chronic obstructive pulmonary disease) (CMS-HCC)    ??? Diabetes (CMS-HCC)    ??? Gangrenous cholecystitis 10/11/2020   ??? GERD (gastroesophageal reflux disease)    ??? Hydronephrosis    ??? Hypertension    ??? Hyponatremia 10/11/2020   ??? Intermediate coronary syndrome (CMS-HCC) 03/13/2014   ??? Lower extremity edema 09/28/2020   ??? Lumbar stenosis    ??? Microscopic hematuria    ??? Nausea alone    ??? Nephrolithiasis 04/17/2016   ??? Neuropathy    ??? Nocturia    ??? Other chronic cystitis    ??? Pulmonary hypertension (CMS-HCC)    ??? Renal colic    ??? Sleep apnea    ??? Unstable angina pectoris (CMS-HCC) 03/13/2014       Past Surgical History:   Procedure Laterality Date   ??? BACK SURGERY  1995   ??? CARPAL TUNNEL RELEASE Left 2014   ??? HYSTERECTOMY  1971   ??? IR INSERT CHOLECYSTOSMY TUBE PERCUTANEOUS  10/02/2020    IR INSERT CHOLECYSTOSMY TUBE PERCUTANEOUS 10/02/2020 Braulio Conte, MD IMG VIR [facility]&V [facility]   ??? LUMBAR DISC SURGERY     ??? PR REMOVAL GALLBLADDER N/A 10/06/2020    Procedure: CHOLECYSTECTOMY;  Surgeon: Katherina Mires, MD;  Location: MAIN OR [facility];  Service: Trauma   ??? PR RIGHT HEART CATH O2 SATURATION & CARDIAC OUTPUT N/A 09/30/2020    Procedure: Right Heart Catheterization;  Surgeon: Neal Dy, MD;  Location: [facility] CATH;  Service: Cardiology   ??? PR RIGHT HEART CATH O2 SATURATION & CARDIAC OUTPUT N/A 01/09/2021    Procedure: Right Heart Catheterization;  Surgeon: Lesle Reek, MD;  Location: [facility] CATH;  Service: Cardiology

## 2021-12-17 ENCOUNTER — Telehealth: Admit: 2021-12-17 | Discharge: 2021-12-18 | Payer: MEDICARE

## 2021-12-17 DIAGNOSIS — N1832 Type 2 diabetes mellitus with stage 3b chronic kidney disease, without long-term current use of insulin (CMS-HCC): Principal | ICD-10-CM

## 2021-12-17 DIAGNOSIS — Z09 Encounter for follow-up examination after completed treatment for conditions other than malignant neoplasm: Principal | ICD-10-CM

## 2021-12-17 DIAGNOSIS — J9611 Chronic respiratory failure with hypoxia: Principal | ICD-10-CM

## 2021-12-17 DIAGNOSIS — N179 Acute kidney failure, unspecified: Principal | ICD-10-CM

## 2021-12-17 DIAGNOSIS — E1122 Type 2 diabetes mellitus with diabetic chronic kidney disease: Principal | ICD-10-CM

## 2021-12-17 DIAGNOSIS — D500 Iron deficiency anemia secondary to blood loss (chronic): Principal | ICD-10-CM

## 2021-12-17 DIAGNOSIS — I5032 Chronic diastolic (congestive) heart failure: Principal | ICD-10-CM

## 2021-12-17 MED ORDER — PEN NEEDLE, DIABETIC 31 GAUGE X 5/16" (8 MM)
ORAL | 11 refills | 0.00000 days | Status: CP
Start: 2021-12-17 — End: 2022-12-17

## 2021-12-17 MED ORDER — INSULIN GLARGINE (U-100) 100 UNIT/ML (3 ML) SUBCUTANEOUS PEN
Freq: Every evening | SUBCUTANEOUS | 3 refills | 0.00000 days | Status: CP
Start: 2021-12-17 — End: 2022-12-17

## 2021-12-17 NOTE — Unmapped (Signed)
Internal Medicine Hospital Follow-Up video Visit    ASSESSMENT / PLAN:  Things to follow-up at next visit:   1. [name], palliative care  2. Diabetes      1. Hospital discharge follow-up    2. 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)   Secondary to amyloid.   On tafamadis, and diurectics, which are currently being held, due to elevated creatinine.   12/20 BNP decreased to 223.   Plan; continue cardiology follow up, watching weight/Taking prn diuretics   3. AKI (acute kidney injury) (CMS-HCC)   Likely pre renal/ on top of [name]/ Still making urine.   Has established already with [facility] nephrology.  Plan: avoid nephrotoxins, cont to follow Creat. Will check creatinine next week.   Will place palliative care referral, as palliative care more likely to be needed as renal failure progresses   4. Type 2 diabetes mellitus with stage 3b chronic kidney disease, without long-term current use of insulin (CMS-HCC)   BS's slightly elevated.   Due to rising creatinine, insulin only option. Will start 4 units lantus at bedtime. Pt and daughter educated about hypoglycemia.   Advised to increase by 2 units if blood sugar fasting > 200 for the days.   Have referred her to Home health nursing.    5. Iron deficiency anemia due to chronic blood loss   Off apixapan, no further blood in stools.   Plan: continue to hold apixaban, check cbc next week. Will also check ferritin at that time.      6. Chronic respiratory failure with hypoxia (CMS-HCC)  Continue inhalers, home oxgyen    Advance Care Planning     See 12/14 palliative care note.   I have also placed a palliative care referral.   Pt expresses some more interest now in avoiding hospitalizations.     Medication adjustments:   1. Start insulin, see above  2. Cont to hold torsemide for now    Follow Up: Return in about 1 week (around 12/24/2021).     I spent 18 minutes on the real-time audio and video with the patient. I spent an additional 30 minutes on pre- and post-visit activities.     The patient was physically located in [place] or a state in which I am permitted to provide care. The patient and/or parent/gauardian understood that s/he may incur co-pays and cost sharing, and agreed to the telemedicine visit. The visit was completed via phone and/or video, which was appropriate and reasonable under the circumstances given the patient's presentation at the time.    The patient and/or parent/guardian has been advised of the potential risks and limitations of this mode of treatment (including, but not limited to, the absence of in-person examination) and has agreed to be treated using telemedicine. The patient's/patient's family's questions regarding telemedicine have been answered.     If the phone/video visit was completed in an ambulatory setting, the patient and/or parent/guardian has also been advised to contact their provider???s office for worsening conditions, and seek emergency medical treatment and/or call 911 if the patient deems either necessary.           Future Appointments   Date Time Provider Department Center   12/18/2021 10:00 AM Barbara Driver, MD [facility]   12/18/2021 12:00 PM Richardean Canal, PTA [facility]   12/24/2021 12:00 PM Loleta Rose, PT [facility]   12/30/2021 To Be Determined Richardean Canal, PTA [facility]   12/30/2021  2:00 PM DIURESIS HF NP [place] [facility]   01/01/2022 To Be Determined Richardean Canal, PTA [facility]   01/05/2022 To Be Determined Silverio Lay [facility]   01/07/2022  3:15 PM Ana Ulysees Barns, MD [facility]   01/08/2022 To Be Determined Stanford Scotland, PT [facility]   01/12/2022 To Be Determined Richardean Canal, PTA [facility]   01/15/2022 To Be Determined Stanford Scotland, PT [facility]   01/16/2022 11:30 AM Carin Hock, MD Janeann Forehand [facility]   01/22/2022  1:30 PM Abhijit Lonia Blood, MD [facility]   02/02/2022  2:00 PM [facility] DEXA RM 1 [facility] [place] [facility]   02/19/2022  1:15 PM Artelia Laroche, MD [facility]   05/08/2022  1:30 PM Zannie Cove, MD [facility]       Medication adherence and barriers to the treatment plan have been addressed. Opportunities to optimize healthy behaviors have been discussed. Patient / caregiver voiced understanding.      This visit is conducted via IT consultant Information  Person Contacted: Patient and daughter  Contact Phone number: [phone] (home)   Is there someone else in the room? Yes. What is your relationship? daughter. Do you want this person here for the visit? yes.  Patient agreed to a video visit    Barbara Huber is a 80 y.o. female  participating in a video encounter.    CHIEF COMPLAINT: Hospital Follow-Up for   Follow-up (Patient stated both feet and legs has pain)    Date of Hospitalization Discharge:  12/14/21     Reviewed: Discharge summary and Labs; See results in Epic  Interactive Contact by phone or other method (Encounter type: Patient Outreach):  Patient Outreach History (Since 12/03/2021)     Cardiology Refill Coordination     Date Method of Outreach Associated Actions User Next Outreach    12/03/2021  3:06 PM Telephone  Mariana Arn Celedonio Savage 12/30/2021          Congestive heart failure     Date Method of Outreach Associated Actions User Next Outreach    12/03/2021 10:05 AM Telephone  Ludwig Lean, RN           Transition of Care     Date Method of Outreach Associated Actions User Next Outreach    12/15/2021 11:51 AM Telephone  Warnell Bureau, RN     12/03/2021 10:00 AM Telephone  Ludwig Lean, RN                Successful contact made or 2 unsuccessful attempts within 2 business days  History obtained from: Family member and Patient    HISTORY OF PRESENT ILLNESS:  Summary of hospitalization:   Pt has COPD on 3L home [place], CHF, [name], [name], T2DM. Had worsening fatigue, new junctional rhythm   Had AKI, with Creat up to 5. AKI-felt to be prerenal on top of [name]  Was taken off apixaban because of bloody stools. Hb remained stable    Discharged 12/18.    Interval update:  Since hospital discharge, Barbara Huber .  Feels more energized today. Yesterday, felt tired.   Saw Cardiology yesterday: Hold torsemide x2 days then restart 40mg  daily with watch on symptoms/weights. In review of most recent admissions, suspect she could have been over-diuresed during admission 12/3-12/6, leading to  impairment in renal function and worsening side effects from gabapentin (fatigue) which caused her to re-present on 12/10.   Weight is 202.8, BP 135/76, HR 66. On 3 liters oxygen.   Breathing-feels okay.   Blood sugar: 176 this morning. Blood sugar  212 yesterday. Blood sugars -before she eats. Other readings, 223, 176. Off all meds for diabetes  No blood in stool.   Taking miralax-once per day. No constipation. Wondering about hard stool.   Daughter needs to redo FMLA.   Wants 5 days as needed.     MEDICATIONS AND ALLERGIES:   Reviewed and updated in EPIC    SOCIAL/FAMILY HISTORY:  Social History:  Reviewed in Epic    REVIEW OF SYSTEMS:    All other systems reviewed are negative except as noted here or in HPI.    PHYSICAL EXAM  Vitals:  There were no vitals filed for this visit.    Exam:  On video-has aged. On oxygen. Speaks in full sentences, lucid  Labs:  Reviewed from discharge.  See Epic labs.  Radiology: Reviewed radiology studies from discharge. See in Epic.       MEDICAL/SURGICAL HISTORY:  Patient Active Problem List   Diagnosis   ??? Spinal stenosis of lumbar region   ??? Thoracic or lumbosacral neuritis or radiculitis   ??? Lumbosacral spondylosis   ??? COPD (chronic obstructive pulmonary disease) (CMS-HCC)   ??? Sleep apnea in adult   ??? Essential hypertension   ??? AKI (acute kidney injury) (CMS-HCC)   ??? Type 2 diabetes mellitus with stage 3b chronic kidney disease, without long-term current use of insulin (CMS-HCC)   ??? Chronic cystitis   ??? Incomplete bladder emptying   ??? Nocturia   ??? Peripheral neuropathy   ??? Chronic respiratory failure with hypoxia (CMS-HCC)   ??? Anemia   ??? Persistent atrial fibrillation (CMS-HCC)   ??? Anemia in chronic kidney disease   ??? Chronic kidney disease, stage III (moderate) (CMS-HCC)   ??? Enrolled in chronic care management   ??? Pulmonary hypertension (CMS-HCC)   ??? Shortness of breath   ??? Acute on chronic diastolic congestive heart failure (CMS-HCC)   ??? Chronic prescription opiate use   ??? Dyspnea on exertion   ??? 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 Cardiac amyloidosis (CMS-HCC)   ??? Recurrent cold sores   ??? Persistent fatigue after COVID-19   ??? Dyspepsia   ??? Iron deficiency anemia due to chronic blood loss   ??? Generalized edema

## 2021-12-17 NOTE — Unmapped (Signed)
Pharmacy Request for Medication Clarification    ??? Name of medication: Insulin Glargine Lantus   ??? What needs to be clarified?:     Pharmacy states they received two directions for this Rx. Please give a call back to clarify which one it should be.     ??? Return call to:   o Name: Karl Pock Company: Pharmacy   o Phone number: [phone]  ??? PCP: Jacquiline Doe, MD  ??? Last encounter in department: 08/27/2021

## 2021-12-17 NOTE — Unmapped (Signed)
[place] [facility]     Type of visit:  video    Reason for visit: F/U patient stated both feet and legs has pain    Questions / Concerns that need to be addressed:     General Consent to Treat (GCT) for non-epic video visits only: MyChart consent    PTHomeBP  BS before Breakfast 176    Screening BP    Omron BPs (complete if screening BP has a systolic  > 130 or diastolic > 80)  BP#1    BP#2   BP#3     Average BP   (please note this as a comment in vitals)     Allergies reviewed: Yes    Medication reviewed: Yes  Pended refills? No    HCDM reviewed and updated in Epic:    We are working to make sure all of our patients??? wishes are updated in Epic and part of that is documenting a Environmental health practitioner for each patient  A Health Care Decision Maker is someone you choose who can make health care decisions for you if you are not able - who would you most want to do this for you????  was updated.    HCDM (patient stated preference): [name],[name] - Daughter - [phone]      BPAs completed:  HARK - Interpersonal Violence  Falls Risk - adults 65+      COVID-19 Vaccine Summary  Which COVID-19 Vaccine was administered  Pfizer  Type:  Dates Given:  11/14/2021     Date last COVID Positive Test:   06/03/2021                   Immunization History   Administered Date(s) Administered   ??? COVID-19 VAC,BIVALENT(75YR UP)BOOST,PFIZER 11/14/2021   ??? COVID-19 VACC,MRNA,(PFIZER)(PF) 01/19/2020, 02/09/2020, 12/12/2020   ??? INFLUENZA QUAD ADJUVANTED 16YR UP(FLUAD) 09/17/2021   ??? INFLUENZA QUAD HIGH DOSE 16YRS+(FLUZONE) 09/28/2016   ??? Influenza Vaccine Quad (IIV4 PF) 20mo+ injectable 10/16/2015, 09/08/2019, 08/30/2020   ??? Influenza Virus Vaccine, unspecified formulation 09/27/2014, 09/23/2017, 09/21/2018   ??? PNEUMOCOCCAL POLYSACCHARIDE 23 09/28/2016   ??? Pneumococcal Conjugate 13-Valent 08/22/2019   ??? SHINGRIX-ZOSTER VACCINE (HZV), RECOMBINANT,SUB-UNIT,ADJUVANTED IM 04/04/2021, 09/17/2021 __________________________________________________________________________________________    SCREENINGS COMPLETED IN FLOWSHEETS    HARK Screening       AUDIT       PHQ2       PHQ9          P4 Suicidality Screener                GAD7       COPD Assessment       Falls Risk

## 2021-12-18 ENCOUNTER — Telehealth
Admit: 2021-12-18 | Discharge: 2021-12-19 | Payer: MEDICARE | Attending: Student in an Organized Health Care Education/Training Program | Primary: Student in an Organized Health Care Education/Training Program

## 2021-12-18 DIAGNOSIS — N390 Urinary tract infection, site not specified: Principal | ICD-10-CM

## 2021-12-18 MED ORDER — ONDANSETRON HCL 4 MG TABLET
ORAL_TABLET | Freq: Three times a day (TID) | ORAL | 2 refills | 20.00000 days | Status: CP | PRN
Start: 2021-12-18 — End: 2022-01-17

## 2021-12-18 MED ORDER — FOSFOMYCIN TROMETHAMINE 3 GRAM ORAL PACKET
ORAL | 0 refills | 84 days | Status: CP
Start: 2021-12-18 — End: 2022-03-18

## 2021-12-18 NOTE — Unmapped (Signed)
Called and spoke with [facility] to clarify Lantus insulin dose 4 units sub q nightly per dr. Venetia Constable.

## 2021-12-18 NOTE — Unmapped (Signed)
ID Clinic Note - Follow-up Visit    The patient reports they are currently: at home. I spent 10 minutes on the real-time audio and video with the patient on the date of service. I spent an additional 25 minutes on pre- and post-visit activities on the date of service.     The patient was physically located in [place] or a state in which I am permitted to provide care. The patient and/or parent/guardian understood that s/he may incur co-pays and cost sharing, and agreed to the telemedicine visit. The visit was reasonable and appropriate under the circumstances given the patient's presentation at the time.    The patient and/or parent/guardian has been advised of the potential risks and limitations of this mode of treatment (including, but not limited to, the absence of in-person examination) and has agreed to be treated using telemedicine. The patient's/patient's family's questions regarding telemedicine have been answered.     If the visit was completed in an ambulatory setting, the patient and/or parent/guardian has also been advised to contact their provider???s office for worsening conditions, and seek emergency medical treatment and/or call 911 if the patient deems either necessary.    ASSESSMENT:  Barbara Huber is a 80 y.o. 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 Brooke Dare for frequent UTIs and prevents today for follow-up after starting fosfomycin ppx in June 2022. She is currently doing well without any urinary symptoms. No interim UTIs since last visit July 2022. We discussed goals of care today with daughter and patient. She was recently admitted for volume overload and now has worsening kidney function after aggressive diuresis. Both daughter and patient would like to maximize her time at home. Given she does not have any side effects from the fosfomycin and a UTI at this point would likely require admission for IV antibiotics, we elected to continue fosfomycin.     PLAN:  - CONTINUE fosfomycin 3g weekly. Refill sent today  - Appreciate [facility] co-management  - Follow-up by video visit in 3 months    ID Problem List:  ?? Frequent UTIs  ?? H/o ESBL E.coli from urine   ?? Long term current use of antibiotics     PRIOR ID HISTORY  ?? Gangrenous cholecystitis s/p perc cholecystostomy tube 10/02/20 w/ cx + Clostridium perfringens and E coli - seen by ID while inpatient  ??  RELEVANT NON-ID DIAGNOSES:  ?? 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 Atrial fibrillation on Eliquis  ?? T2DM  ?? [name]  ?? OSA      Lysle Morales, MD   Fellow, [facility] Division of Infectious Diseases    [facility]   [address] 5th floor  [place], [place] [address]  Phone: [phone]   Fax: [phone]     _____________________________________________________________________      CC: recurrent UTI      HPI: 80 y.o. 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 Brooke Dare for frequent UTIs.??    Please see initial consult note from 05/28/21 for full details of history. At last telehealth visit in 08/2021, fosfomycin prophylaxis was continued for another 3 month trial with plans to possibly discontinue at this visit today.     She was recently hospitalized for volume overload in the setting  of her [name] and required aggressive diuresis. She expresses frustration with repeated hospitalizations and now worsening kidney function. Upon discharge, there were talks about setting up home hospice for additional help at home. She is not having symptoms of a repeat UTI at this time despite positive urine culture during her past hospitalization. They express frustration from multiple hospitalizations over the past year and want to maximize her time at home. She denies any side effects from her fosfomycin.     Past Medical History:   Diagnosis Date   ??? Acute kidney injury superimposed on chronic kidney disease (CMS-HCC) 10/11/2020   ??? Acute on chronic diastolic (congestive) heart failure (CMS-HCC) 08/23/2020   ??? Arthritis    ??? Calculus of kidney    ??? Calculus of ureter    ??? CHF (congestive heart failure) (CMS-HCC)    ??? Chronic atrial fibrillation (CMS-HCC) 07/20/2019   ??? COPD (chronic obstructive pulmonary disease) (CMS-HCC)    ??? Diabetes (CMS-HCC)    ??? Gangrenous cholecystitis 10/11/2020   ??? GERD (gastroesophageal reflux disease)    ??? Hydronephrosis    ??? Hypertension    ??? Hyponatremia 10/11/2020   ??? Intermediate coronary syndrome (CMS-HCC) 03/13/2014   ??? Lower extremity edema 09/28/2020   ??? Lumbar stenosis    ??? Microscopic hematuria    ??? Nausea alone    ??? Nephrolithiasis 04/17/2016   ??? Neuropathy    ??? Nocturia    ??? Other chronic cystitis    ??? Pulmonary hypertension (CMS-HCC)    ??? Renal colic    ??? Sleep apnea    ??? Unstable angina pectoris (CMS-HCC) 03/13/2014       Past Surgical History:   Procedure Laterality Date   ??? BACK SURGERY  1995   ??? CARPAL TUNNEL RELEASE Left 2014   ??? HYSTERECTOMY  1971   ??? IR INSERT CHOLECYSTOSMY TUBE PERCUTANEOUS  10/02/2020    IR INSERT CHOLECYSTOSMY TUBE PERCUTANEOUS 10/02/2020 Braulio Conte, MD IMG VIR [facility]   ??? LUMBAR DISC SURGERY     ??? PR REMOVAL GALLBLADDER N/A 10/06/2020    Procedure: CHOLECYSTECTOMY;  Surgeon: Katherina Mires, MD;  Location: MAIN OR [facility];  Service: Trauma   ??? PR RIGHT HEART CATH O2 SATURATION & CARDIAC OUTPUT N/A 09/30/2020    Procedure: Right Heart Catheterization;  Surgeon: Neal Dy, MD;  Location: [facility] CATH;  Service: Cardiology   ??? PR RIGHT HEART CATH O2 SATURATION & CARDIAC OUTPUT N/A 01/09/2021    Procedure: Right Heart Catheterization;  Surgeon: Lesle Reek, MD;  Location: [facility] CATH;  Service: Cardiology       Allergies   Allergen Reactions   ??? Nitrofurantoin Anaphylaxis and Other (See Comments)   ??? Lipitor [Atorvastatin] Muscle Pain     SOB, Headache, fatigue, sick on my stomach       Current Outpatient Medications   Medication Sig Dispense Refill   ??? ACCU-CHEK AVIVA PLUS TEST STRP Strp UESE TO CHECK BLOOD SUGAR 3 TIMES A DAY BEFORE MEALS 100 each 11   ??? acetaminophen (TYLENOL) 500 MG tablet Take 1,000 mg by mouth daily as needed for pain.      ??? ADVAIR HFA 115-21 mcg/actuation inhaler INHALE TWO PUFFS BY MOUTH TWICE A DAY 12 g 11   ??? albuterol HFA 90 mcg/actuation inhaler Inhale 2 puffs every eight (8) hours as needed for wheezing.     ??? amiodarone (PACERONE) 200 MG tablet Take 0.5 tablets (100 mg total) by mouth daily. 45 tablet 3   ???  cranberry 500 mg cap Take 500 mg by mouth daily with evening meal.     ??? estradioL (ESTRACE) 0.01 % (0.1 mg/gram) vaginal cream INSERT ONE GRAM VAGINALLY AT BEDTIME     ??? fosfomycin (MONUROL) 3 gram Pack Take 3 g by mouth once a week. 36 g 0   ??? insulin glargine (BASAGLAR, LANTUS) 100 unit/mL (3 mL) injection pen Inject 0.04 mL (4 Units total) under the skin nightly. 15 mL 3   ??? lancets Misc 1 each by Miscellaneous route daily. Accu Check 100 each 6   ??? metOLazone (ZAROXOLYN) 5 MG tablet Take 1 tablet (5 mg total) by mouth daily as needed (when instructed by cardiology clinic). 30 tablet 1   ??? montelukast (SINGULAIR) 10 mg tablet Take 1 tablet (10 mg total) by mouth daily. 90 tablet 3   ??? NARCAN 4 mg/actuation nasal spray 1 spray into alternating nostrils once as needed (opioid overdose). PRN - Emergency use. (Patient not taking: Reported on 12/16/2021)     ??? NON FORMULARY APPLY FROM NECK DOWN TWICE A DAY     ??? ondansetron (ZOFRAN) 4 MG tablet Take 1 tablet (4 mg total) by mouth every eight (8) hours as needed. 60 tablet 2   ??? oxyCODONE (OXYCONTIN) 10 mg TR12 12 hr crush resistant ER/CR tablet Take 10 mg by mouth every twelve (12) hours.     ??? OXYGEN-AIR DELIVERY SYSTEMS MISC 5 L by Miscellaneous route. Currently using 3  L/min via [place]     ??? pantoprazole (PROTONIX) 20 MG tablet Take 1 tablet (20 mg total) by mouth in the morning. 60 tablet 5   ??? 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 rosuvastatin (CRESTOR) 5 MG tablet Take 1 tablet (5 mg total) by mouth every other day. 15 tablet 11   ??? SPIRIVA RESPIMAT 2.5 mcg/actuation inhalation mist INHALE TWO PUFFS BY MOUTH ONCE DAILY 4 g 11   ??? spironolactone (ALDACTONE) 25 MG tablet Take 25 mg by mouth in the morning.     ??? tafamidis 61 mg cap Take 1 capsule (61 mg) by mouth daily. 30 capsule 11   ??? torsemide (DEMADEX) 10 MG tablet Take 4 tablets (40 mg total) by mouth daily. 120 tablet 0   ??? varicella-zoster [id number], PF, (SHINGRIX, PF,) 50 mcg/0.5 mL SusR injection Inject 0.5 mL into the muscle. (Patient not taking: Reported on 12/16/2021) 0.5 mL 1     No current facility-administered medications for this visit.       Social history:  H/o 2 vaginal births. Hysterectomy + oopherectomy 50 years ago. No tobacco use.    Social History     Social History Narrative    Previously worked in Designer, fashion/clothing, retired.  Daughter is Economist who works at [employer].  Her niece is Selena Batten, [employer] Anesthesia Tech.  Lives in [place] with daughter and son-in-law.       Family History   Problem Relation Age of Onset   ??? Hypertension Mother    ??? Cancer Father         COLON CANCER   ??? Anesthesia problems Neg Hx    ??? Broken bones Neg Hx    ??? Clotting disorder Neg Hx    ??? Collagen disease Neg Hx    ??? Diabetes Neg Hx    ??? Dislocations Neg Hx    ???  Fibromyalgia Neg Hx    ??? Gout Neg Hx    ??? Hemophilia Neg Hx    ??? Osteoporosis Neg Hx    ??? Rheumatologic disease Neg Hx    ??? Scoliosis Neg Hx    ??? Severe sprains Neg Hx    ??? Sickle cell anemia Neg Hx    ??? Spinal Compression Fracture Neg Hx    ??? GU problems Neg Hx    ??? Kidney cancer Neg Hx    ??? Prostate cancer Neg Hx        ROS: All other systems reviewed and negative except as per HPI.     Labs:    Lab Results   Component Value Date    WBC 8.9 12/24/2021    WBC 9.4 12/14/2021    WBC 12.5 (H) 01/06/2011    Absolute Neutrophils 7.1 12/24/2021    Absolute Lymphocytes 0.8 (L) 12/24/2021    Absolute Eosinophils 0.1 12/24/2021    HGB 9.8 (L) 12/24/2021    HGB 12.9 01/06/2011    HCT 31.8 (L) 12/24/2021    HCT 40.8 01/06/2011    Platelet 325 12/24/2021    Platelet 363 12/14/2021    Platelet 260 01/06/2011    Sodium 136 12/24/2021    Sodium 140 01/06/2011    Potassium 4.1 12/24/2021    Potassium 4.1 01/06/2011    BUN 67 (H) 12/24/2021    BUN 20 01/06/2011    Creatinine 2.83 (H) 12/24/2021    Creatinine 4.66 (H) 12/16/2021    Creatinine 0.79 01/06/2011    Glucose 190 (H) 12/24/2021    Magnesium 3.4 (H) 12/16/2021    Magnesium 2.0 01/06/2011    T Albumin 3.7 11/29/2020    Albumin 3.1 (L) 12/09/2021    Total Bilirubin 0.5 12/09/2021    AST 10 12/09/2021    ALT <7 (L) 12/09/2021    Alkaline Phosphatase 63 12/09/2021    INR 2.15 07/01/2021    INR 1.0 01/06/2011    Sed Rate 21 04/28/2020    CRP <4.0 06/03/2021    CRP 9.1 04/27/2020       Urinalysis:   Lab Results   Component Value Date    WBC, UA 5 09/10/2014    RBC, UA 2 09/10/2014    Squam Epithel, UA 13 (H) 12/06/2021    Squam Epithel, UA 2 09/10/2014    Bacteria, UA Few (A) 12/06/2021    Bacteria, UA FEW 09/10/2014    Leukocyte Esterase, UA Moderate (A) 12/06/2021    Leukocyte Esterase, UA TRACE 09/10/2014    Nitrite, UA Negative 12/06/2021    Nitrite, UA POSITIVE 09/10/2014    Protein, UA Trace (A) 12/06/2021    Protein, UA NEGATIVE 09/10/2014    Blood, UA Negative 12/06/2021    Blood, UA NEGATIVE 09/10/2014    pH, UA 5.0 12/06/2021    pH, UA 6.0 09/10/2014       Microbiology:  ??  Susceptibility Tests        Amoxicillin + Clavulanate Ampicillin Ampicillin + Sulbactam Aztreonam Cefazolin Cefepime Ceftazidime Ceftriaxone Cephalexin Ciprofloxacin Doxycycline Ertapenem Gentamicin Levofloxacin Meropenem Nitrofurantoin Piperacillin + Tazobactam Tetracycline Tobramycin Trimethoprim + Sulfamethoxazole Vancomycin Screen    Collected Procedure Specimen Type Organism                         12/06/21 Urine Culture Urine Klebsiella pneumoniae S R S  S    S S   S S  I S S S S  Streptococcus bovis group                         05/06/21 Urine Culture Urine Vancomycin Resistant Enterococcus faecium  R         S     R     R    02/26/21 Urine Culture Urine Escherichia coli  S   S    S S   S S  S S S S S     02/05/21 Urine Culture Urine Escherichia coli  S   S    S S   S S  S S S S S     01/21/21 Urine Culture Urine Enterococcus faecalis  S         S     S     S       Escherichia coli  S   S    S S   S S  S S S S S        Aerococcus urinae                         01/03/21 Urine Culture Urine Escherichia coli  R  R R R I R R R  S S R S S  R S R     09/28/20 Urine Culture Urine Escherichia coli  R I R R R R R R R  S S R S S  R S R        Streptococcus bovis group                         08/23/20 Urine Culture Urine Escherichia coli  R I R R R R R R R  S S R S S  R S R     04/27/20 Urine Culture Urine Klebsiella oxytoca S R S  I  S S  S   S S  S S S S S     06/18/18 Urine Culture Urine Escherichia coli  R I  R   S  R   S R  S   S R     01/21/17 Urine culture Urine Proteus mirabilis  S   S     S   S S  R   S S                   Imaging:   None

## 2021-12-18 NOTE — Unmapped (Signed)
PA Submitted via Location manager.   Case ID:  [id number]     Awaiting Determination. S.Fiza Nation, RMA

## 2021-12-18 NOTE — Unmapped (Signed)
Thank you for coming in today.    Please note that your laboratory and other results may be visible to you in real time, possibly before they reach your provider. Please allow 48 hours for clinical interpretation of these results. Importantly, even if a result is flagged as abnormal, it may not be one that impacts your health.    The ID clinic phone number is [phone] (toll free [phone]).  The ID clinic fax number is [phone].    For urgent issues on nights and weekends you may reach the ID Physician on call through the [facility] Operator at [phone].     Chriss Driver, MD  [facility] Infectious Diseases [facility] at [facility]  [address]   [place], [place] [address]  Phone: [phone]   Fax: [phone]

## 2021-12-23 NOTE — Unmapped (Signed)
Additional information request sent to office by [facility] informing Lantus Brand is covered. Katie from [facility] informed patient patient picked up [employer] on 12.22.22.  PA withdrawn for Lantus (Generic).

## 2021-12-24 ENCOUNTER — Ambulatory Visit: Admit: 2021-12-24 | Discharge: 2021-12-25 | Payer: MEDICARE

## 2021-12-24 LAB — CBC W/ AUTO DIFF
BASOPHILS ABSOLUTE COUNT: 0.1 10*9/L (ref 0.0–0.1)
BASOPHILS RELATIVE PERCENT: 1.1 %
EOSINOPHILS ABSOLUTE COUNT: 0.1 10*9/L (ref 0.0–0.5)
EOSINOPHILS RELATIVE PERCENT: 0.9 %
HEMATOCRIT: 31.8 % — ABNORMAL LOW (ref 34.0–44.0)
HEMOGLOBIN: 9.8 g/dL — ABNORMAL LOW (ref 11.3–14.9)
LYMPHOCYTES ABSOLUTE COUNT: 0.8 10*9/L — ABNORMAL LOW (ref 1.1–3.6)
LYMPHOCYTES RELATIVE PERCENT: 9.3 %
MEAN CORPUSCULAR HEMOGLOBIN CONC: 30.7 g/dL — ABNORMAL LOW (ref 32.0–36.0)
MEAN CORPUSCULAR HEMOGLOBIN: 24.3 pg — ABNORMAL LOW (ref 25.9–32.4)
MEAN CORPUSCULAR VOLUME: 79.1 fL (ref 77.6–95.7)
MEAN PLATELET VOLUME: 8.7 fL (ref 6.8–10.7)
MONOCYTES ABSOLUTE COUNT: 0.8 10*9/L (ref 0.3–0.8)
MONOCYTES RELATIVE PERCENT: 9.1 %
NEUTROPHILS ABSOLUTE COUNT: 7.1 10*9/L (ref 1.8–7.8)
NEUTROPHILS RELATIVE PERCENT: 79.6 %
NUCLEATED RED BLOOD CELLS: 0 /100{WBCs} (ref <=4)
PLATELET COUNT: 325 10*9/L (ref 150–450)
RED BLOOD CELL COUNT: 4.02 10*12/L (ref 3.95–5.13)
RED CELL DISTRIBUTION WIDTH: 22.7 % — ABNORMAL HIGH (ref 12.2–15.2)
WBC ADJUSTED: 8.9 10*9/L (ref 3.6–11.2)

## 2021-12-24 LAB — BASIC METABOLIC PANEL
ANION GAP: 7 mmol/L (ref 5–14)
BLOOD UREA NITROGEN: 67 mg/dL — ABNORMAL HIGH (ref 9–23)
BUN / CREAT RATIO: 24
CALCIUM: 9.5 mg/dL (ref 8.7–10.4)
CHLORIDE: 99 mmol/L (ref 98–107)
CO2: 30 mmol/L (ref 20.0–31.0)
CREATININE: 2.83 mg/dL — ABNORMAL HIGH
EGFR CKD-EPI (2021) FEMALE: 16 mL/min/{1.73_m2} — ABNORMAL LOW (ref >=60)
GLUCOSE RANDOM: 190 mg/dL — ABNORMAL HIGH (ref 70–179)
POTASSIUM: 4.1 mmol/L (ref 3.4–4.8)
SODIUM: 136 mmol/L (ref 135–145)

## 2021-12-24 LAB — FERRITIN: FERRITIN: 51.2 ng/mL

## 2021-12-24 LAB — SLIDE REVIEW

## 2021-12-30 ENCOUNTER — Ambulatory Visit: Admit: 2021-12-30 | Discharge: 2021-12-31 | Payer: MEDICARE | Attending: Adult Health | Primary: Adult Health

## 2021-12-30 DIAGNOSIS — I4819 Other persistent atrial fibrillation: Principal | ICD-10-CM

## 2021-12-30 DIAGNOSIS — D649 Anemia, unspecified: Principal | ICD-10-CM

## 2021-12-30 DIAGNOSIS — E854 Organ-limited amyloidosis: Principal | ICD-10-CM

## 2021-12-30 DIAGNOSIS — I43 Cardiomyopathy in diseases classified elsewhere: Principal | ICD-10-CM

## 2021-12-30 DIAGNOSIS — I5032 Chronic diastolic (congestive) heart failure: Principal | ICD-10-CM

## 2021-12-30 LAB — BASIC METABOLIC PANEL
ANION GAP: 11 mmol/L (ref 5–14)
BLOOD UREA NITROGEN: 50 mg/dL — ABNORMAL HIGH (ref 9–23)
BUN / CREAT RATIO: 21
CALCIUM: 9.7 mg/dL (ref 8.7–10.4)
CHLORIDE: 102 mmol/L (ref 98–107)
CO2: 26.1 mmol/L (ref 20.0–31.0)
CREATININE: 2.4 mg/dL — ABNORMAL HIGH
EGFR CKD-EPI (2021) FEMALE: 20 mL/min/{1.73_m2} — ABNORMAL LOW (ref >=60)
GLUCOSE RANDOM: 124 mg/dL (ref 70–179)
POTASSIUM: 4.3 mmol/L (ref 3.4–4.8)
SODIUM: 139 mmol/L (ref 135–145)

## 2021-12-30 LAB — IRON PANEL
IRON SATURATION: 7 % — ABNORMAL LOW (ref 20–55)
IRON: 24 ug/dL — ABNORMAL LOW
TOTAL IRON BINDING CAPACITY: 337 ug/dL (ref 250–425)

## 2021-12-30 LAB — CBC
HEMATOCRIT: 34.3 % (ref 34.0–44.0)
HEMOGLOBIN: 10.6 g/dL — ABNORMAL LOW (ref 11.3–14.9)
MEAN CORPUSCULAR HEMOGLOBIN CONC: 31 g/dL — ABNORMAL LOW (ref 32.0–36.0)
MEAN CORPUSCULAR HEMOGLOBIN: 24.8 pg — ABNORMAL LOW (ref 25.9–32.4)
MEAN CORPUSCULAR VOLUME: 80 fL (ref 77.6–95.7)
MEAN PLATELET VOLUME: 8.5 fL (ref 6.8–10.7)
PLATELET COUNT: 240 10*9/L (ref 150–450)
RED BLOOD CELL COUNT: 4.29 10*12/L (ref 3.95–5.13)
RED CELL DISTRIBUTION WIDTH: 23.1 % — ABNORMAL HIGH (ref 12.2–15.2)
WBC ADJUSTED: 7.9 10*9/L (ref 3.6–11.2)

## 2021-12-30 LAB — MAGNESIUM: MAGNESIUM: 2.2 mg/dL (ref 1.6–2.6)

## 2021-12-30 LAB — B-TYPE NATRIURETIC PEPTIDE: B-TYPE NATRIURETIC PEPTIDE: 174.68 pg/mL — ABNORMAL HIGH (ref <=100)

## 2021-12-30 MED ORDER — AMIODARONE 200 MG TABLET
ORAL_TABLET | Freq: Every day | ORAL | 3 refills | 90.00000 days | Status: CP
Start: 2021-12-30 — End: 2022-12-30

## 2021-12-30 MED ORDER — APIXABAN 2.5 MG TABLET
ORAL_TABLET | Freq: Two times a day (BID) | ORAL | 11 refills | 30.00000 days | Status: CP
Start: 2021-12-30 — End: 2022-12-30

## 2021-12-30 NOTE — Unmapped (Signed)
[name] Clinic Discharge Instructions:    MEDICATIONS:  NO medication changes today.  - Continue torsemide 40mg  daily; take extra 20mg  as needed for weight gain of 3lbs overnight or 5lbs in a week. If you are unsure whether to take extra, mychart message me  - I will let you know about the Eliquis  Call if you have questions about your medications.    IMPORTANT INSTRUCTIONS:  - Weigh yourself daily in the morning; write your weight down and bring it with you next time   - Limit your fluid intake to 2 Liters (half-gallon) per day    - Limit your salt intake to 2-3 grams (2000-3000 mg) per day  - If you have a morning diuresis appointment, don't take your morning diuretic  - If you have an afternoon diuresis appointment, take your morning diuretic    LABS:  We will call you if your labs need attention    NEXT APPOINTMENT:  Return to clinic in 2.5 weeks with  Dr Wonda Cheng as scheduled       My office number (c/o De Burrs RN) is [phone] if you need further assistance, or need to schedule with our [name] clinic in the future.    If you need to reschedule future appointments, please call [phone].   After office hours, if you have urgent questions/problems, contact the on-call cardiologist through the hospital operator: [phone].    Please do not send a MyChart message for potentially life-threatening symptoms or time-sensitive issues. MyChart messages are not monitored after normal business hours or on weekends. Please call 911 for a true medical emergency.

## 2021-12-30 NOTE — Unmapped (Signed)
[facility] Barbara Huber    Referring Provider: Artelia Laroche, MD  [address]  [address]  [place],  [place] [address]   Primary Provider: Jacquiline Doe, MD  [address]  [place] [address]   Other Providers:  Barbara Barbara Huber    Reason for Visit:  Barbara Huber is a 81 y.o. female being seen for routine visit and continued care of cardiac amyloidosis.    Assessment & Plan:  1. 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   - Moderately increased wall thickness with low voltage ECG - she's had issues with hypotension on ARB and BB, as well as atrial arrhythmias. SPEP/UPEP/free light chains without concern. PYP NM SPECT +  for ATTR Barbara, grade 3 uptake.  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 She has one daughter who accompanies her and is informed.   - DC'd Jardiance 10mg  daily due to recurrent UTIs  - She appears euvolemic on exam today on torsemide 40mg  daily with extra 20mg  PRN for weight gain of 3lbs overnight or 5lbs in a week.   1b. AKI  - Creatinine improved from 4.66 --> 2.83 --> 2.40. Cr/BUN elevated in setting of recent admissions, suspect she could have been over-diuresed during admission 12/3-12/6, leading to impairment in renal function and worsening side effects from gabapentin (fatigue) which caused her to re-present on 12/10.   - Continue Tafamidis 61 mg daily   - Continue spironolactone 25 mg daily   - We discussed cardioMEMS today, given recent admission x2 for IV diuretics, as well as significant renal impairment due to overdiuresis. She would benefit from closer monitoring of volume status. Patient and daughter watched video today, given pamphlet. Will discuss w/ Barbara Huber at upcoming visit.     2. Persistent atrial fibrillation  - CHA2DS2-VASc score = 5 (1-gender, 2-Age, 1-DM, 1-HTN)  - Restart Eliquis 2.5mg  BID (reduced dose for age, renal function) - Hgb improved from 9.3 --> 9.8 --> 10.6 with no melena since discharge  - She had been in afib since at least 07/2020; now s/p cardioversion 01/10/21 and continues to be in NSR since  - On amiodarone 200mg  daily - increased it back herself after seeing no improvement in tremors/jerking with decreased dose    3. COPD  - home O2 3L [place] - followed by pulm  - on Advair and Spiriva    4. Iron deficiency anemia  - s/p 5 IV iron sessions   - Hgb low during admission (7.3 on 12/13). Improved to 9.3 on discharge, 10.6 today  - Received IV iron on 12/13. Recheck iron panel - if TSAT <16%, would qualify for IV Iron outpatient.   - As above, restart Eliquis 2.5mg  BID.     Follow-up:  Return in about 3 weeks (around 01/20/2022) for already scheduled appointment.    History of Present Illness:  Barbara Huber is a 81 y.o. female with PMHx of COPD on 3L home [place], CHF, [name], [name], T2DM??who presents today for hospital follow-up. She presented to [facility]??with Acute on Chronic Decompensated Diastolic CHF in the setting of Septic Shock d/t Gangrenous Cholecystitis now s/p cholecystectomy 10/6. She presented with 1 week of dyspnea on exertion, leg swelling and abdominal distention without chest pain. Her dry weight was 232 pounds,??238 pounds. Upon arrival to [facility], she was in afib with RVR with higher  rates and softer pressures requiring NE and addition of vaso to maintain MAP>65. She was given digoxin x 1 for better rate control with mild improvement in rates but was still requiring high doses of NE as well as vaso.??Dobutamine given to augment diuresis via bumex drip and metolazone x1 dose. Etiology of pressor requirement likely due to septic shock. Echo 10/4 showed upper-normal RV size with??reduced systolic function.??RHC to evaluate for RV dysfunction unable to be completed due to arterial placement of micropuncture sheath during procedure. She was weaned off of pressors and continued to improve with diuresis and treatment of underlying infection. At time of discharge, her acute Diastolic HF was controlled and she was restarted on home meds. For her afib, coreg was held during admission but then restarted prior to discharge.     She as seen in hospital follow-up 10/30/20. She was discharged on Lasix 40mg  BID. Her weight was 221lbs on discharge. Lasix was reduced to 20mg  daily by her PCP for low SBPs (although notes say 40mg  daily). Since reducing the Lasix, her weight crept up to 226lbs. Her belly felt  tight and she had LE edema.  At visit with PCP, her Coreg was also changed to metoprolol 25mg  and irbesartan was stopped. BPs were running 110s/70-90s. HR running around 100-110. Her metoprolol was increased to 50mg  for rate control. Lasix was increased to 40mg  daily.    She was seen 11/18 and her weight was been running 224-227lbs. She has low energy and since increasing metoprolol she notes worsening of her symptoms. She feels bloated and has LE edema. She is not back to her usual activities, like fixing her food or cooking. Her BP is running 100-120s/70-80s.     PYP scan showed TTR Barbara. She then saw Barbara Barbara Huber 12/3 and was feeling ok but somewhat volume up.   We met her on 1/7. Metoprolol was decreased to 50mg  daily and she was started on amiodarone. She unfortunately was hospitalized 1/11-1/15 for 3 days of worsening shortness of breath and fatigue.  She was found to have AKI and oliguria in the setting of increased beta blocker as an outpatient resulting in decreased cardiac filling/output. She was treated with gentle diuresis and removal of beta blockade with subsequent improvement in blood pressure and renal function. She underwent RHC with showed elevated filling pressures, normal CO, and restrictive filling pattern. She had a DCCV 1/14 with resolution of atrial fibrillation. She was continued on amiodarone.    As of 03/21/21, she has been feeling well. Her weight has been stable on 40 mg torsemide BID. She has not had any more UTIs and her Huber has been back to when she was feeling well. She has more energy and appetite is a bit better too. Her breathing and energy are good. Her HR has been very good and so has her BP.    Since her last visit in clinic 03/21/21,  she has had multiple admissions: 4/22 for weakness/urosepsis, 4/27 for shortness of breath/orthopnea/weight gain, 6/7 for lightheadedness/SOB/Blurry vision found to be anemic. She also had two recent [name] 6/17 for LE edema and dyspnea on exertion in the setting of recently being taken off her torsemide and spironolactone, and again 6/22 for anemia with Hgb down to 7.5. As of 6/24, she states her energy is a little better and she has more of an appetite. She has no swelling or SOB. She is sleeping fine at night without orthopnea/PND. She is taking torsemide daily still rather than every  other day.     She was seen 9/2 for follow-up. She had more swelling in her legs so torsemide was increased to 40mg  qAM, 20mg  qPM.     She was last seen in late November and was feeling well over all. She is not having a lot of issues with volume retention. Weight remains 216-218lbs. It was up to 221lbs today after receiving a COVID booster on Friday. She plans to take extra torsemide this afternoon. She has a bit of sock line edema. She hasnt taken metolazone in 2 weeks. She has good energy, is cooking for herself a bit. She is having bothersome jerking/tremors in her arms/hands. She is thinking it's caused by a medicine, wondering if it's the amiodarone.      She was admitted to [facility] 12/3-12/6 for [name] Exacerbation. She reports that her weight was around 218-219 though her understanding is that her dry weight should be around 214.Weight on admission 223lbs.??She tried taking a dose of metolazone earlier in the week and also increasing her afternoon torsemide from 20 to 40 with minimal response. She somewhat more short of breath than baseline, especially with exertion. LE edema was stable to her. On labs her BNP is up from baseline. Started on 40mg  IV Lasix BID and given  5mg  metolazone. Weight now trending down.  Creatinine increased to 3.15 from a baseline around 2.5 also with azotemia and mild hyperkalemia. Creatinine trended down with diuresis and was 2.47 on discharge. She was discharged on torsemide 40mg  qAM, 20mg  qPM. Unclear discharge weight, as 213 was documented on 12/5, 221 on 12/6, and 213 on 12/7.  She was then readmitted 12/10-12/18 for fatigue, new junctional rhythm, worsening AKI, troponin elevation, and possible volume overload.??During admission, she was at her dry weight of 213 pounds. ??Despite lack of overt volume overload signs and symptoms, given elevated BNP, subjective fatigue, and worsening AKI, she was diuresed while inpatient with Lasix 120 BID, in addition to metolazone 10 daily. TTE showed LVEF of >70%. LV with severely increased wall thickness, G3DD, RV with normal systolic function, elevated Right atrial pressure. Before discharge she was switched to PO diuresis and given the increasing Cr, was given a diuretic holiday for a day. Cr improved, so she was discharged on 40 torsemide BID and metolazone 5 PRN.     She was seen in hospital follow-up on 12/20.  Since discharge home, her weight went down to 202lbs and she felt dried out on torsemide 40mg  BID. Her Cr was still signficantl;y elevated from baseline at 4.66 and she appeared dry on exam so torsemide was held x2 days then restarted at 40mg  daily. Today, she states she is feeling pretty well volume wise without LE edema or Huber. Her weight is staying around 202-204lbs. She did take an extra 20mg  torsemide about 2 days ago because weight went up to 206lbs, but has since come back down to 204lbs. She wants to be able to move better than she is, but is able to get around the house and fix her breakfast. PT is coming to the house. She gets nervous about falling.     Cardiovascular History & Procedures:    Cath / PCI:  ?? none    CV Surgery:  ??  none    EP Procedures and Devices:  ?? none    Non-Invasive Evaluation(s):    Echo:  12/09/21  Summary    1. The left ventricle is normal in size with severely increased wall  thickness.  2. The left ventricular systolic function is hyperdynamic, LVEF is visually  estimated at >70%.    3. There is grade III diastolic dysfunction (severely elevated filling  pressure).    4. The left atrium is moderately to severely dilated in size.    5. The right ventricle is mildly dilated in size, with normal systolic  function.    6. The right atrium is mildly dilated  in size.    7. IVC size and inspiratory change suggest elevated right atrial pressure.  (10-20 mmHg).    04/24/21   1. The left ventricle is normal in size with mildly increased wall  thickness.    2. The left ventricular systolic function is normal, LVEF is visually  estimated at > 55%.    3. There is grade II diastolic dysfunction (elevated filling pressure).    4. Mitral annular calcification is present (mild).    5. The left atrium is mildly dilated in size.    6. The right ventricle is mildly dilated in size, with low normal systolic  function.    7. IVC size and inspiratory change suggest elevated right atrial pressure.  (10-20 mmHg).    ?? 09/30/20  Summary    1. Limited study to assess systolic function.    2. IVC size and inspiratory change suggest normal right atrial pressure.  (0-5 mmHg).    3. The left ventricle is normal in size with mildly to moderately increased  wall thickness.    4. The left ventricular systolic function is normal with no obvious wall  motion abnormalities, LVEF is visually estimated at > 55%.    5. Mitral annular calcification is present.    6. The left atrium is mildly dilated in size.    7. The right ventricle is upper normal in size, with reduced systolic  Function.    Cardiac CT/MRI/Nuclear Tests:  ??  None    6 Minute Walk:  ?? None    Cardiopulmonary Stress Tests:  ??  None               Other Past Medical History:  See below for the complete EPIC list of past medical and surgical history.      Allergies:  Nitrofurantoin and Lipitor [atorvastatin]    Current Medications:  Current Outpatient Medications   Medication Sig Dispense Refill   ??? ACCU-CHEK AVIVA PLUS TEST STRP Strp UESE TO CHECK BLOOD SUGAR 3 TIMES A DAY BEFORE MEALS 100 each 11   ??? acetaminophen (TYLENOL) 500 MG tablet Take 1,000 mg by mouth daily as needed for pain.      ??? ADVAIR HFA 115-21 mcg/actuation inhaler INHALE TWO PUFFS BY MOUTH TWICE A DAY 12 g 11   ??? albuterol HFA 90 mcg/actuation inhaler Inhale 2 puffs every eight (8) hours as needed for wheezing.     ??? cranberry 500 mg cap Take 500 mg by mouth daily with evening meal.     ??? estradioL (ESTRACE) 0.01 % (0.1 mg/gram) vaginal cream INSERT ONE GRAM VAGINALLY AT BEDTIME     ??? fosfomycin (MONUROL) 3 gram Pack Take 3 g by mouth once a week. 36 g 0   ??? insulin glargine (BASAGLAR, LANTUS) 100 unit/mL (3 mL) injection pen Inject 0.04 mL (4 Units total) under the skin nightly. 15 mL 3   ??? lancets Misc 1 each by Miscellaneous route daily. Accu Check 100 each 6   ??? montelukast (SINGULAIR) 10 mg tablet Take 1 tablet (10 mg total) by  mouth daily. 90 tablet 3   ??? NON FORMULARY APPLY FROM NECK DOWN TWICE A DAY     ??? ondansetron (ZOFRAN) 4 MG tablet Take 1 tablet (4 mg total) by mouth every eight (8) hours as needed. 60 tablet 2   ??? oxyCODONE (OXYCONTIN) 10 mg TR12 12 hr crush resistant ER/CR tablet Take 10 mg by mouth every twelve (12) hours.     ??? OXYGEN-AIR DELIVERY SYSTEMS MISC 5 L by Miscellaneous route. Currently using 3  L/min via [place]     ??? pantoprazole (PROTONIX) 20 MG tablet Take 1 tablet (20 mg total) by mouth in the morning. 60 tablet 5   ??? 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 rosuvastatin (CRESTOR) 5 MG tablet Take 1 tablet (5 mg total) by mouth every other day. 15 tablet 11 ??? SPIRIVA RESPIMAT 2.5 mcg/actuation inhalation mist INHALE TWO PUFFS BY MOUTH ONCE DAILY 4 g 11   ??? spironolactone (ALDACTONE) 25 MG tablet Take 25 mg by mouth in the morning.     ??? tafamidis 61 mg cap Take 1 capsule (61 mg) by mouth daily. 30 capsule 11   ??? torsemide (DEMADEX) 10 MG tablet Take 4 tablets (40 mg total) by mouth daily. 120 tablet 0   ??? varicella-zoster gE-AS01B, PF, (SHINGRIX, PF,) 50 mcg/0.5 mL SusR injection Inject 0.5 mL into the muscle. 0.5 mL 1   ??? amiodarone (PACERONE) 200 MG tablet Take 1 tablet (200 mg total) by mouth daily. 90 tablet 3   ??? apixaban (ELIQUIS) 2.5 mg Tab Take 1 tablet (2.5 mg total) by mouth Two (2) times a day. 60 tablet 11   ??? metOLazone (ZAROXOLYN) 5 MG tablet Take 1 tablet (5 mg total) by mouth daily as needed (when instructed by cardiology clinic). 30 tablet 1   ??? NARCAN 4 mg/actuation nasal spray 1 spray into alternating nostrils once as needed (opioid overdose). PRN - Emergency use. (Patient not taking: Reported on 12/16/2021)       No current facility-administered medications for this visit.       Family History:  The patient's family history includes Cancer in her father; Hypertension in her mother.    Social history:  She  reports that she quit smoking about 4 years ago. Her smoking use included cigarettes. She smoked an average of .33 packs per day. She has never used smokeless tobacco. She reports that she does not drink alcohol and does not use drugs.    Review of Systems:  As per HPI.  Rest of the review of ten systems is negative or unremarkable except as stated above.    Physical Exam:  VITAL SIGNS:   Vitals:    12/30/21 1400   BP: 136/62   Pulse: 65   SpO2:       Wt Readings from Last 3 Encounters:   12/30/21 93.3 kg (205 lb 11.2 oz)   12/26/21 93.6 kg (206 lb 4 oz)   12/17/21 91.6 kg (202 lb)      Today's Body mass index is 33.2 kg/m??.      CONSTITUTIONAL: chronically ill-appearing in no acute distress  EYES: Conjunctivae and sclerae clear and anicteric.  ENT: Benign.   CARDIOVASCULAR: JVP not seen above the clavicle with HOB at 90 degrees. Rate and rhythm are regular.  There is no lifts or heaves.  Normal S1, S2.  There is no murmur, gallops or rubs.  Radial and pedal pulses are 2+, bilaterally.   There is no pedal edema, bilaterally.   RESPIRATORY: Normal respiratory effort. Clear to auscultation bilaterally..  There are no wheezes.  GASTROINTESTINAL: Soft, non-tender, with audible bowel sounds. Abdomen nondistended.  Liver is nonpalpable.  SKIN: No rashes, ecchymosis or petechiae.  Warm, well perfused.   MUSCULOSKELETAL:  no joint swelling   NEURO/PSYCH: Appropriate mood and affect. Alert and oriented to person, place, and time. No gross motor or sensory deficits evident.    Pertinent Laboratory Studies:   Appointment on 12/30/2021   Component Date Value Ref Range Status   ??? BNP 12/30/2021 174.68 (H)  <=100 pg/mL Final   ??? Magnesium 12/30/2021 2.2  1.6 - 2.6 mg/dL Final   ??? Sodium 16/09/9603 139  135 - 145 mmol/L Final   ??? Potassium 12/30/2021 4.3  3.4 - 4.8 mmol/L Final   ??? Chloride 12/30/2021 102  98 - 107 mmol/L Final   ??? CO2 12/30/2021 26.1  20.0 - 31.0 mmol/L Final   ??? Anion Gap 12/30/2021 11  5 - 14 mmol/L Final   ??? BUN 12/30/2021 50 (H)  9 - 23 mg/dL Final   ??? Creatinine 12/30/2021 2.40 (H)  0.60 - 0.80 mg/dL Final   ??? BUN/Creatinine Ratio 12/30/2021 21   Final   ??? eGFR [name]-EPI (2021) Female 12/30/2021 20 (L)  >=60 mL/min/1.69m2 Final   ??? Glucose 12/30/2021 124  70 - 179 mg/dL Final   ??? Calcium 54/08/8118 9.7  8.7 - 10.4 mg/dL Final   ??? WBC 14/78/2956 7.9  3.6 - 11.2 10*9/L Final   ??? RBC 12/30/2021 4.29  3.95 - 5.13 10*12/L Final   ??? HGB 12/30/2021 10.6 (L)  11.3 - 14.9 g/dL Final   ??? HCT 21/30/8657 34.3  34.0 - 44.0 % Final   ??? MCV 12/30/2021 80.0  77.6 - 95.7 fL Final   ??? MCH 12/30/2021 24.8 (L)  25.9 - 32.4 pg Final   ??? MCHC 12/30/2021 31.0 (L)  32.0 - 36.0 g/dL Final   ??? RDW 84/69/6295 23.1 (H)  12.2 - 15.2 % Final   ??? MPV 12/30/2021 8.5  6.8 - 10.7 fL Final   ??? Platelet 12/30/2021 240  150 - 450 10*9/L Final   Appointment on 12/24/2021   Component Date Value Ref Range Status   ??? Sodium 12/24/2021 136  135 - 145 mmol/L Final   ??? Potassium 12/24/2021 4.1  3.4 - 4.8 mmol/L Final   ??? Chloride 12/24/2021 99  98 - 107 mmol/L Final   ??? CO2 12/24/2021 30.0  20.0 - 31.0 mmol/L Final   ??? Anion Gap 12/24/2021 7  5 - 14 mmol/L Final   ??? BUN 12/24/2021 67 (H)  9 - 23 mg/dL Final   ??? Creatinine 12/24/2021 2.83 (H)  0.60 - 0.80 mg/dL Final   ??? BUN/Creatinine Ratio 12/24/2021 24   Final   ??? eGFR [name]-EPI (2021) Female 12/24/2021 16 (L)  >=60 mL/min/1.21m2 Final   ??? Glucose 12/24/2021 190 (H)  70 - 179 mg/dL Final   ??? Calcium 28/41/3244 9.5  8.7 - 10.4 mg/dL Final   ??? Ferritin 12/30/7251 51.2  7.3 - 270.7 ng/mL Final   ??? WBC 12/24/2021 8.9  3.6 - 11.2 10*9/L Final   ??? RBC 12/24/2021 4.02  3.95 - 5.13 10*12/L Final   ??? HGB 12/24/2021 9.8 (L)  11.3 - 14.9 g/dL Final   ??? HCT 66/44/0347 31.8 (L)  34.0 -  44.0 % Final   ??? MCV 12/24/2021 79.1  77.6 - 95.7 fL Final   ??? MCH 12/24/2021 24.3 (L)  25.9 - 32.4 pg Final   ??? MCHC 12/24/2021 30.7 (L)  32.0 - 36.0 g/dL Final   ??? RDW 03/50/0938 22.7 (H)  12.2 - 15.2 % Final   ??? MPV 12/24/2021 8.7  6.8 - 10.7 fL Final   ??? Platelet 12/24/2021 325  150 - 450 10*9/L Final   ??? nRBC 12/24/2021 0  <=4 /100 WBCs Final   ??? Neutrophils % 12/24/2021 79.6  % Final   ??? Lymphocytes % 12/24/2021 9.3  % Final   ??? Monocytes % 12/24/2021 9.1  % Final   ??? Eosinophils % 12/24/2021 0.9  % Final   ??? Basophils % 12/24/2021 1.1  % Final   ??? Absolute Neutrophils 12/24/2021 7.1  1.8 - 7.8 10*9/L Final   ??? Absolute Lymphocytes 12/24/2021 0.8 (L)  1.1 - 3.6 10*9/L Final   ??? Absolute Monocytes 12/24/2021 0.8  0.3 - 0.8 10*9/L Final   ??? Absolute Eosinophils 12/24/2021 0.1  0.0 - 0.5 10*9/L Final   ??? Absolute Basophils 12/24/2021 0.1  0.0 - 0.1 10*9/L Final   ??? Anisocytosis 12/24/2021 Marked (A)  Not Present Final   ??? Smear Review Comments 12/24/2021    Final   Office Visit on 12/16/2021   Component Date Value Ref Range Status   ??? Sodium 12/16/2021 129 (L)  135 - 145 mmol/L Final   ??? Potassium 12/16/2021 3.8  3.4 - 4.8 mmol/L Final   ??? Chloride 12/16/2021 85 (L)  98 - 107 mmol/L Final   ??? CO2 12/16/2021 31.9 (H)  20.0 - 31.0 mmol/L Final   ??? Anion Gap 12/16/2021 12  5 - 14 mmol/L Final   ??? BUN 12/16/2021 92 (H)  9 - 23 mg/dL Final   ??? Creatinine 12/16/2021 4.66 (H)  0.60 - 0.80 mg/dL Final   ??? BUN/Creatinine Ratio 12/16/2021 20   Final   ??? eGFR [name]-EPI (2021) Female 12/16/2021 9 (L)  >=60 mL/min/1.70m2 Final   ??? Glucose 12/16/2021 212 (H)  70 - 179 mg/dL Final   ??? Calcium 18/29/9371 10.1  8.7 - 10.4 mg/dL Final   ??? BNP 12/16/2021 223.36 (H)  <=100 pg/mL Final   ??? Magnesium 12/16/2021 3.4 (H)  1.6 - 2.6 mg/dL Final   No results displayed because visit has over 200 results.      No results displayed because visit has over 200 results.      Office Visit on 11/14/2021   Component Date Value Ref Range Status   ??? Sodium 11/14/2021 138  135 - 145 mmol/L Final   ??? Potassium 11/14/2021 4.6  3.4 - 4.8 mmol/L Final   ??? Chloride 11/14/2021 97 (L)  98 - 107 mmol/L Final   ??? CO2 11/14/2021 33.2 (H)  20.0 - 31.0 mmol/L Final   ??? Anion Gap 11/14/2021 8  5 - 14 mmol/L Final   ??? BUN 11/14/2021 61 (H)  9 - 23 mg/dL Final   ??? Creatinine 11/14/2021 2.73 (H)  0.60 - 0.80 mg/dL Final   ??? BUN/Creatinine Ratio 11/14/2021 22   Final   ??? eGFR [name]-EPI (2021) Female 11/14/2021 17 (L)  >=60 mL/min/1.2m2 Final   ??? Glucose 11/14/2021 214 (H)  70 - 179 mg/dL Final   ??? Calcium 69/67/8938 9.0  8.7 - 10.4 mg/dL Final   ??? BNP 11/14/2021 300.97 (H)  <=100 pg/mL Final   ??? Sodium 11/14/2021  138  135 - 145 mmol/L Final   ??? Potassium 11/14/2021 5.0 (H)  3.4 - 4.8 mmol/L Final   ??? Chloride 11/14/2021 96 (L)  98 - 107 mmol/L Final   ??? CO2 11/14/2021 33.2 (H)  20.0 - 31.0 mmol/L Final   ??? Anion Gap 11/14/2021 9  5 - 14 mmol/L Final   ??? BUN 11/14/2021 63 (H)  9 - 23 mg/dL Final   ??? Creatinine 11/14/2021 2.77 (H)  0.60 - 0.80 mg/dL Final   ??? BUN/Creatinine Ratio 11/14/2021 23   Final   ??? eGFR [name]-EPI (2021) Female 11/14/2021 17 (L)  >=60 mL/min/1.31m2 Final   ??? Glucose 11/14/2021 217 (H)  70 - 179 mg/dL Final   ??? Calcium 65/78/4696 9.1  8.7 - 10.4 mg/dL Final   ??? WBC 29/52/8413 8.0  3.6 - 11.2 10*9/L Final   ??? RBC 11/14/2021 3.87 (L)  3.95 - 5.13 10*12/L Final   ??? HGB 11/14/2021 9.6 (L)  11.3 - 14.9 g/dL Final   ??? HCT 24/40/1027 31.2 (L)  34.0 - 44.0 % Final   ??? MCV 11/14/2021 80.5  77.6 - 95.7 fL Final   ??? MCH 11/14/2021 24.9 (L)  25.9 - 32.4 pg Final   ??? MCHC 11/14/2021 30.9 (L)  32.0 - 36.0 g/dL Final   ??? RDW 25/36/6440 17.6 (H)  12.2 - 15.2 % Final   ??? MPV 11/14/2021 8.1  6.8 - 10.7 fL Final   ??? Platelet 11/14/2021 274  150 - 450 10*9/L Final   ??? nRBC 11/14/2021 0  <=4 /100 WBCs Final   ??? Neutrophils % 11/14/2021 77.2  % Final   ??? Lymphocytes % 11/14/2021 10.2  % Final   ??? Monocytes % 11/14/2021 8.6  % Final   ??? Eosinophils % 11/14/2021 2.5  % Final   ??? Basophils % 11/14/2021 1.5  % Final   ??? Absolute Neutrophils 11/14/2021 6.2  1.8 - 7.8 10*9/L Final   ??? Absolute Lymphocytes 11/14/2021 0.8 (L)  1.1 - 3.6 10*9/L Final   ??? Absolute Monocytes 11/14/2021 0.7  0.3 - 0.8 10*9/L Final   ??? Absolute Eosinophils 11/14/2021 0.2  0.0 - 0.5 10*9/L Final   ??? Absolute Basophils 11/14/2021 0.1  0.0 - 0.1 10*9/L Final   ??? Anisocytosis 11/14/2021 Slight (A)  Not Present Final   Office Visit on 10/23/2021   Component Date Value Ref Range Status   ??? Creat U 10/23/2021 44.6  Undefined mg/dL Final   ??? Protein, Ur 10/23/2021 <6.0  Undefined mg/dL Final   ??? Protein/Creatinine Ratio, Urine 10/23/2021    Final   ??? Creat U 10/23/2021 44.7  Undefined mg/dL Final   ??? Albumin Quantitative, Urine 10/23/2021 <0.3  Undefined mg/dL Final   ??? Albumin/Creatinine Ratio 10/23/2021    Final   ??? Spec Gravity/POC 10/23/2021 1.015  1.003 - 1.030 Final   ??? PH/POC 10/23/2021 6.5  5.0 - 9.0 Final   ??? Leuk Esterase/POC 10/23/2021 Negative  Negative Final   ??? Nitrite/POC 10/23/2021 Negative  Negative Final   ??? Protein/POC 10/23/2021 Negative  Negative Final   ??? UA Glucose/POC 10/23/2021 Negative  Negative Final   ??? Ketones, POC 10/23/2021 Negative  Negative Final   ??? Bilirubin/POC 10/23/2021 Negative  Negative Final   ??? Blood/POC 10/23/2021 Negative  Negative Final   ??? Urobilinogen/POC 10/23/2021 0.2  0.2 - 1.0 mg/dL Final       Lab Results   Component Value Date    PRO-BNP 3,420.0 (H) 07/03/2021  PRO-BNP 905.0 (H) 03/21/2021    PRO-BNP 3,796.0 (H) 12/23/2020    Creatinine 2.40 (H) 12/30/2021    Creatinine 2.83 (H) 12/24/2021    Creatinine 0.79 01/06/2011    BUN 50 (H) 12/30/2021    BUN 67 (H) 12/24/2021    BUN 20 01/06/2011    Sodium 139 12/30/2021    Sodium 140 01/06/2011    Potassium 4.3 12/30/2021    Potassium 4.1 01/06/2011    CO2 26.1 12/30/2021    CO2 30 01/06/2011    Magnesium 2.2 12/30/2021    Magnesium 2.0 01/06/2011    Total Bilirubin 0.5 12/09/2021    INR 2.15 07/01/2021    INR 1.0 01/06/2011       Lab Results   Component Value Date    Digoxin Level 0.9 10/01/2020       Lab Results   Component Value Date    TSH 2.731 12/06/2021    Cholesterol 95 12/06/2021    Triglycerides 66 12/06/2021    HDL 42 12/06/2021    Non-HDL Cholesterol 53 (L) 12/06/2021    LDL Calculated 40 12/06/2021       Lab Results   Component Value Date    WBC 7.9 12/30/2021    WBC 12.5 (H) 01/06/2011    HGB 10.6 (L) 12/30/2021    HGB 12.9 01/06/2011    HCT 34.3 12/30/2021    HCT 40.8 01/06/2011    Platelet 240 12/30/2021    Platelet 260 01/06/2011       Pertinent Test Results from Today:  None    Other pertinent records were reviewed.    The following are further history from the patient's EPIC record for reference:     Past Medical History:   Diagnosis Date   ??? Acute kidney injury superimposed on chronic kidney disease (CMS-HCC) 10/11/2020   ??? Acute on chronic diastolic (congestive) heart failure (CMS-HCC) 08/23/2020   ??? Arthritis    ??? Calculus of kidney    ??? Calculus of ureter    ??? CHF (congestive heart failure) (CMS-HCC)    ??? Chronic atrial fibrillation (CMS-HCC) 07/20/2019   ??? COPD (chronic obstructive pulmonary disease) (CMS-HCC)    ??? Diabetes (CMS-HCC)    ??? Gangrenous cholecystitis 10/11/2020   ??? GERD (gastroesophageal reflux disease)    ??? Hydronephrosis    ??? Hypertension    ??? Hyponatremia 10/11/2020   ??? Intermediate coronary syndrome (CMS-HCC) 03/13/2014   ??? Lower extremity edema 09/28/2020   ??? Lumbar stenosis    ??? Microscopic hematuria    ??? Nausea alone    ??? Nephrolithiasis 04/17/2016   ??? Neuropathy    ??? Nocturia    ??? Other chronic cystitis    ??? Pulmonary hypertension (CMS-HCC)    ??? Renal colic    ??? Sleep apnea    ??? Unstable angina pectoris (CMS-HCC) 03/13/2014       Past Surgical History:   Procedure Laterality Date   ??? BACK SURGERY  1995   ??? CARPAL TUNNEL RELEASE Left 2014   ??? HYSTERECTOMY  1971   ??? IR INSERT CHOLECYSTOSMY TUBE PERCUTANEOUS  10/02/2020    IR INSERT CHOLECYSTOSMY TUBE PERCUTANEOUS 10/02/2020 Braulio Conte, MD IMG VIR [facility]   ??? LUMBAR DISC SURGERY     ??? PR REMOVAL GALLBLADDER N/A 10/06/2020    Procedure: CHOLECYSTECTOMY;  Surgeon: Katherina Mires, MD;  Location: MAIN OR [facility];  Service: Trauma   ??? PR RIGHT HEART CATH O2 SATURATION & CARDIAC OUTPUT N/A 09/30/2020  Procedure: Right Heart Catheterization;  Surgeon: Neal Dy, MD;  Location: [facility] CATH;  Service: Cardiology   ??? PR RIGHT HEART CATH O2 SATURATION & CARDIAC OUTPUT N/A 01/09/2021    Procedure: Right Heart Catheterization;  Surgeon: Lesle Reek, MD;  Location: [facility] CATH;  Service: Cardiology

## 2021-12-31 DIAGNOSIS — E854 Organ-limited amyloidosis: Principal | ICD-10-CM

## 2021-12-31 DIAGNOSIS — D500 Iron deficiency anemia secondary to blood loss (chronic): Principal | ICD-10-CM

## 2021-12-31 DIAGNOSIS — I5032 Chronic diastolic (congestive) heart failure: Principal | ICD-10-CM

## 2021-12-31 DIAGNOSIS — I43 Cardiomyopathy in diseases classified elsewhere: Principal | ICD-10-CM

## 2021-12-31 MED ORDER — VYNDAMAX 61 MG CAPSULE
ORAL_CAPSULE | Freq: Every day | ORAL | 11 refills | 30 days
Start: 2021-12-31 — End: ?

## 2022-01-01 MED ORDER — VYNDAMAX 61 MG CAPSULE
ORAL_CAPSULE | Freq: Every day | ORAL | 11 refills | 30.00000 days | Status: CP
Start: 2022-01-01 — End: ?
  Filled 2022-01-05: qty 30, 30d supply, fill #0

## 2022-01-01 NOTE — Unmapped (Signed)
[facility] Refill Coordination Note    Specialty Medication(s) to be Shipped:   General Specialty: Vyndamax    Other medication(s) to be shipped: No additional medications requested for fill at this time     Barbara Huber, DOB: 17-Jun-1941  Phone: [phone] (home)       All above HIPAA information was verified with patient.     Was a Nurse, learning disability used for this call? No    Completed refill call assessment today to schedule patient's medication shipment from the [facility] [phone]).  All relevant notes have been reviewed.     Specialty medication(s) and dose(s) confirmed: Regimen is correct and unchanged.   Changes to medications: Barbara Huber reports no changes at this time.  Changes to insurance: No  New side effects reported not previously addressed with a pharmacist or physician: None reported  Questions for the pharmacist: No    Confirmed patient received a Conservation officer, historic buildings and a Surveyor, mining with first shipment. The patient will receive a drug information handout for each medication shipped and additional FDA Medication Guides as required.       DISEASE/MEDICATION-SPECIFIC INFORMATION        N/A    SPECIALTY MEDICATION ADHERENCE     Medication Adherence    Patient reported X missed doses in the last month: 0  Specialty Medication: Vyndamax 43  Patient is on additional specialty medications: No  Patient is on more than two specialty medications: No              Were doses missed due to medication being on hold? No    Vyndamax 61 mg: 10 days of medicine on hand       REFERRAL TO PHARMACIST     Referral to the pharmacist: Not needed      [facility]     Shipping address confirmed in Epic.     Delivery Scheduled: Yes, Expected medication delivery date: 01/06/22.  However, Rx request for refills was sent to the provider as there are none remaining.     Medication will be delivered via Next Day Courier to the prescription address in Epic WAM.    Barbara Huber [facility] Specialty Technician

## 2022-01-01 NOTE — Unmapped (Signed)
This was a telehealth service where a fellow was involved. I was immediately available via telephone/pager. (Patient disconnected from call before I could evaluate).    Ladona Horns, MD, FIDSA  Associate Professor of Medicine  [facility] Division of Infectious Diseases

## 2022-01-02 ENCOUNTER — Encounter: Admit: 2022-01-02 | Payer: MEDICARE

## 2022-01-16 ENCOUNTER — Ambulatory Visit
Admit: 2022-01-16 | Discharge: 2022-01-17 | Payer: MEDICARE | Attending: Cardiovascular Disease | Primary: Cardiovascular Disease

## 2022-01-16 MED ORDER — SERTRALINE 50 MG TABLET
ORAL_TABLET | Freq: Every day | ORAL | 3 refills | 90 days | Status: CP
Start: 2022-01-16 — End: 2023-01-16

## 2022-01-16 NOTE — Unmapped (Signed)
[facility] Note    Referring Provider: Artelia Laroche, MD  [address]  [address]  [place],  [place] [address]   Primary Provider: Jacquiline Doe, MD  [address]  [place] [address]   Other Providers:  Dr Luretha Murphy    Reason for Visit:  Barbara Huber is a 81 y.o. female being seen for routine visit and continued care of cardiac amyloidosis.    Assessment & Plan:  1. 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   - Moderately increased wall thickness with low voltage ECG - she's had issues with hypotension on ARB and BB, as well as atrial arrhythmias. SPEP/UPEP/free light chains without concern. 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 She has one daughter who accompanies her and is informed.   - DC'd Jardiance 10mg  daily due to recurrent UTIs  - She appears euvolemic on exam today on torsemide 40mg  daily with extra 20mg  PRN for weight gain of 3lbs overnight or 5lbs in a week.   1b. AKI  - Creatinine improved from 4.66 --> 2.83 --> 2.40. Cr/BUN elevated in setting of recent admissions, suspect she could have been over-diuresed during admission 12/3-12/6, leading to impairment in renal function and worsening side effects from gabapentin (fatigue) which caused her to re-present on 12/10.   - Continue Tafamidis 61 mg daily   - Continue spironolactone 25 mg daily   - We discussed cardioMEMS today, given recent admission x2 for IV diuretics, as well as significant renal impairment due to overdiuresis. She would benefit from closer monitoring of volume status. She will continue to consider placement - will let us know if she decides to proceed with this.     2. Persistent atrial fibrillation  - CHA2DS2-VASc score = 5 (1-gender, 2-Age, 1-DM, 1-HTN)  - Restart Eliquis 2.5mg  BID (reduced dose for age, renal function) - Hgb improved from 9.3 --> 9.8 --> 10.6 with no melena since discharge  - She had been in afib since at least 07/2020; now s/p cardioversion 01/10/21 and continues to be in NSR since  - On amiodarone 200mg  daily - increased it back herself after seeing no improvement in tremors/jerking with decreased dose    3. COPD  - home O2 3L [place] - followed by pulm  - on Advair and Spiriva    4. Iron deficiency anemia  - s/p 5 IV iron sessions   - Hgb low during admission (7.3 on 12/13). Improved to 9.3 on discharge, 10.6 today  - Received IV iron on 12/13. Recheck iron panel - if TSAT <16%, would qualify for IV Iron outpatient.   - As above, restart Eliquis 2.5mg  BID.     5. Anxiety  - Start Zoloft 50mg . Will recheck effect on repeat visit.     Follow-up:  Return in about 3 weeks (around 02/06/2022) for Recheck. With [employer]. Will re-check volume status as well as mood and get repeat labs than.    History of Present Illness:  Barbara Huber is a 81 y.o. female with PMHx of COPD on 3L home [place], CHF, [name], [name], T2DM??who presents today for hospital follow-up. She presented to [facility]??with Acute on Chronic Decompensated Diastolic CHF in the setting of Septic Shock d/t Gangrenous Cholecystitis now s/p cholecystectomy 10/6. She presented with 1  week of dyspnea on exertion, leg swelling and abdominal distention without chest pain. Her dry weight was 232 pounds,??238 pounds. Upon arrival to [facility], she was in afib with RVR with higher rates and softer pressures requiring NE and addition of vaso to maintain MAP>65. She was given digoxin x 1 for better rate control with mild improvement in rates but was still requiring high doses of NE as well as vaso.??Dobutamine given to augment diuresis via bumex drip and metolazone x1 dose. Etiology of pressor requirement likely due to septic shock. Echo 10/4 showed upper-normal RV size with??reduced systolic function.??RHC to evaluate for RV dysfunction unable to be completed due to arterial placement of micropuncture sheath during procedure. She was weaned off of pressors and continued to improve with diuresis and treatment of underlying infection. At time of discharge, her acute Diastolic HF was controlled and she was restarted on home meds. For her afib, coreg was held during admission but then restarted prior to discharge.     She as seen in hospital follow-up 10/30/20. She was discharged on Lasix 40mg  BID. Her weight was 221lbs on discharge. Lasix was reduced to 20mg  daily by her PCP for low SBPs (although notes say 40mg  daily). Since reducing the Lasix, her weight crept up to 226lbs. Her belly felt  tight and she had LE edema.  At visit with PCP, her Coreg was also changed to metoprolol 25mg  and irbesartan was stopped. BPs were running 110s/70-90s. HR running around 100-110. Her metoprolol was increased to 50mg  for rate control. Lasix was increased to 40mg  daily.    She was seen 11/18 and her weight was been running 224-227lbs. She has low energy and since increasing metoprolol she notes worsening of her symptoms. She feels bloated and has LE edema. She is not back to her usual activities, like fixing her food or cooking. Her BP is running 100-120s/70-80s.     PYP scan showed TTR amyloid. She then saw Dr Luretha Murphy 12/3 and was feeling ok but somewhat volume up.   We met her on 1/7. Metoprolol was decreased to 50mg  daily and she was started on amiodarone. She unfortunately was hospitalized 1/11-1/15 for 3 days of worsening shortness of breath and fatigue.  She was found to have AKI and oliguria in the setting of increased beta blocker as an outpatient resulting in decreased cardiac filling/output. She was treated with gentle diuresis and removal of beta blockade with subsequent improvement in blood pressure and renal function. She underwent RHC with showed elevated filling pressures, normal CO, and restrictive filling pattern. She had a DCCV 1/14 with resolution of atrial fibrillation. She was continued on amiodarone.    As of 03/21/21, she has been feeling well. Her weight has been stable on 40 mg torsemide BID. She has not had any more UTIs and her DOE has been back to when she was feeling well. She has more energy and appetite is a bit better too. Her breathing and energy are good. Her HR has been very good and so has her BP.    Since her last visit in clinic 03/21/21,  she has had multiple admissions: 4/22 for weakness/urosepsis, 4/27 for shortness of breath/orthopnea/weight gain, 6/7 for lightheadedness/SOB/Blurry vision found to be anemic. She also had two recent [name] 6/17 for LE edema and dyspnea on exertion in the setting of recently being taken off her torsemide and spironolactone, and again 6/22 for anemia with Hgb down to 7.5. As of 6/24, she states her energy is  a little better and she has more of an appetite. She has no swelling or SOB. She is sleeping fine at night without orthopnea/PND. She is taking torsemide daily still rather than every other day.     She was seen 9/2 for follow-up. She had more swelling in her legs so torsemide was increased to 40mg  qAM, 20mg  qPM.     She was last seen in late November and was feeling well over all. She is not having a lot of issues with volume retention. Weight remains 216-218lbs. It was up to 221lbs today after receiving a COVID booster on Friday. She plans to take extra torsemide this afternoon. She has a bit of sock line edema. She hasnt taken metolazone in 2 weeks. She has good energy, is cooking for herself a bit. She is having bothersome jerking/tremors in her arms/hands. She is thinking it's caused by a medicine, wondering if it's the amiodarone.      She was admitted to [facility] 12/3-12/6 for [name] Exacerbation. She reports that her weight was around 218-219 though her understanding is that her dry weight should be around 214.Weight on admission 223lbs.??She tried taking a dose of metolazone earlier in the week and also increasing her afternoon torsemide from 20 to 40 with minimal response. She somewhat more short of breath than baseline, especially with exertion. LE edema was stable to her. On labs her BNP is up from baseline. Started on 40mg  IV Lasix BID and given  5mg  metolazone. Weight now trending down.  Creatinine increased to 3.15 from a baseline around 2.5 also with azotemia and mild hyperkalemia. Creatinine trended down with diuresis and was 2.47 on discharge. She was discharged on torsemide 40mg  qAM, 20mg  qPM. Unclear discharge weight, as 213 was documented on 12/5, 221 on 12/6, and 213 on 12/7.  She was then readmitted 12/10-12/18 for fatigue, new junctional rhythm, worsening AKI, troponin elevation, and possible volume overload.??During admission, she was at her dry weight of 213 pounds. ??Despite lack of overt volume overload signs and symptoms, given elevated BNP, subjective fatigue, and worsening AKI, she was diuresed while inpatient with Lasix 120 BID, in addition to metolazone 10 daily. TTE showed LVEF of >70%. LV with severely increased wall thickness, G3DD, RV with normal systolic function, elevated Right atrial pressure. Before discharge she was switched to PO diuresis and given the increasing Cr, was given a diuretic holiday for a day. Cr improved, so she was discharged on 40 torsemide BID and metolazone 5 PRN.     She was seen in hospital follow-up on 12/20.  Since discharge home, her weight went down to 202lbs and she felt dried out on torsemide 40mg  BID. Her Cr was still signficantly elevated from baseline at 4.66 and she appeared dry on exam so torsemide was held x2 days then restarted at 40mg  daily. On 1/03 she states she is feeling pretty well volume wise without LE edema or DOE. Her weight is staying around 202-204lbs. She did take an extra 20mg  torsemide about 2 days ago because weight went up to 206lbs, but has since come back down to 204lbs. PT is coming to the house.      She presents to the clinic today for a follow up visit. She is feeling well from a cardiac standpoint. Around December of last year, she started feeling more 'jerky' in her hands and had difficulty moving them. We discussed cardioMEMS again in consideration of her recent ED visits for diuresis, patient will continue to consider. Barbara Huber  is very distraught as her younger sister recently disclosed her terminal lung cancer diagnosis and is beginning hospice care. For her anxiety she can start Zoloft.     No lab work today.         Cardiovascular History & Procedures:    Cath / PCI:  ?? none    CV Surgery:  ??  none    EP Procedures and Devices:  ?? none    Non-Invasive Evaluation(s):    Echo:  12/09/21  Summary    1. The left ventricle is normal in size with severely increased wall  thickness.    2. The left ventricular systolic function is hyperdynamic, LVEF is visually  estimated at >70%.    3. There is grade III diastolic dysfunction (severely elevated filling  pressure).    4. The left atrium is moderately to severely dilated in size.    5. The right ventricle is mildly dilated in size, with normal systolic  function.    6. The right atrium is mildly dilated  in size.    7. IVC size and inspiratory change suggest elevated right atrial pressure.  (10-20 mmHg).    04/24/21   1. The left ventricle is normal in size with mildly increased wall  thickness.    2. The left ventricular systolic function is normal, LVEF is visually  estimated at > 55%.    3. There is grade II diastolic dysfunction (elevated filling pressure).    4. Mitral annular calcification is present (mild).    5. The left atrium is mildly dilated in size.    6. The right ventricle is mildly dilated in size, with low normal systolic  function.    7. IVC size and inspiratory change suggest elevated right atrial pressure.  (10-20 mmHg).    ?? 09/30/20  Summary    1. Limited study to assess systolic function.    2. IVC size and inspiratory change suggest normal right atrial pressure.  (0-5 mmHg).    3. The left ventricle is normal in size with mildly to moderately increased  wall thickness.    4. The left ventricular systolic function is normal with no obvious wall  motion abnormalities, LVEF is visually estimated at > 55%.    5. Mitral annular calcification is present.    6. The left atrium is mildly dilated in size.    7. The right ventricle is upper normal in size, with reduced systolic  Function.    Cardiac CT/MRI/Nuclear Tests:  ??  None    6 Minute Walk:  ?? None    Cardiopulmonary Stress Tests:  ??  None               Other Past Medical History:  See below for the complete EPIC list of past medical and surgical history.      Allergies:  Nitrofurantoin and Lipitor [atorvastatin]    Current Medications:  Current Outpatient Medications   Medication Sig Dispense Refill   ??? ACCU-CHEK AVIVA PLUS TEST STRP Strp UESE TO CHECK BLOOD SUGAR 3 TIMES A DAY BEFORE MEALS 100 each 11   ??? acetaminophen (TYLENOL) 500 MG tablet Take 1,000 mg by mouth daily as needed for pain.      ??? ADVAIR HFA 115-21 mcg/actuation inhaler INHALE TWO PUFFS BY MOUTH TWICE A DAY 12 g 11   ??? albuterol HFA 90 mcg/actuation inhaler Inhale 2 puffs every eight (8) hours as needed for wheezing.     ??? amiodarone (PACERONE) 200  MG tablet Take 1 tablet (200 mg total) by mouth daily. 90 tablet 3   ??? apixaban (ELIQUIS) 2.5 mg Tab Take 1 tablet (2.5 mg total) by mouth Two (2) times a day. 60 tablet 11   ??? cranberry 500 mg cap Take 500 mg by mouth daily with evening meal.     ??? estradioL (ESTRACE) 0.01 % (0.1 mg/gram) vaginal cream INSERT ONE GRAM VAGINALLY AT BEDTIME     ??? fosfomycin (MONUROL) 3 gram Pack Take 3 g by mouth once a week. 36 g 0   ??? insulin glargine (BASAGLAR, LANTUS) 100 unit/mL (3 mL) injection pen Inject 0.04 mL (4 Units total) under the skin nightly. 15 mL 3   ??? lancets Misc 1 each by Miscellaneous route daily. Accu Check 100 each 6   ??? montelukast (SINGULAIR) 10 mg tablet Take 1 tablet (10 mg total) by mouth daily. 90 tablet 3   ??? NON FORMULARY APPLY FROM NECK DOWN TWICE A DAY     ??? oxyCODONE (OXYCONTIN) 10 mg TR12 12 hr crush resistant ER/CR tablet Take 10 mg by mouth every twelve (12) hours.     ??? OXYGEN-AIR DELIVERY SYSTEMS MISC 5 L by Miscellaneous route. Currently using 3  L/min via [place]     ??? pantoprazole (PROTONIX) 20 MG tablet Take 1 tablet (20 mg total) by mouth in the morning. 60 tablet 5   ??? 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 rosuvastatin (CRESTOR) 5 MG tablet Take 1 tablet (5 mg total) by mouth every other day. 15 tablet 11   ??? SPIRIVA RESPIMAT 2.5 mcg/actuation inhalation mist INHALE TWO PUFFS BY MOUTH ONCE DAILY 4 g 11   ??? spironolactone (ALDACTONE) 25 MG tablet Take 25 mg by mouth in the morning.     ??? tafamidis (VYNDAMAX) 61 mg cap Take 1 capsule (61 mg) by mouth daily. 30 capsule 11   ??? varicella-zoster gE-AS01B, PF, (SHINGRIX, PF,) 50 mcg/0.5 mL SusR injection Inject 0.5 mL into the muscle. 0.5 mL 1   ??? metOLazone (ZAROXOLYN) 5 MG tablet Take 1 tablet (5 mg total) by mouth daily as needed (when instructed by cardiology clinic). 30 tablet 1   ??? NARCAN 4 mg/actuation nasal spray 1 spray into alternating nostrils once as needed (opioid overdose). PRN - Emergency use. (Patient not taking: Reported on 12/16/2021)     ??? sertraline (ZOLOFT) 50 MG tablet Take 1 tablet (50 mg total) by mouth daily. 90 tablet 3   ??? torsemide (DEMADEX) 10 MG tablet Take 4 tablets (40 mg total) by mouth daily. 120 tablet 0     No current facility-administered medications for this visit.       Family History:  The patient's family history includes Cancer in her father; Hypertension in her mother.    Social history:  She  reports that she quit smoking about 4 years ago. Her smoking use included cigarettes. She smoked an average of .33 packs per day. She has never used smokeless tobacco. She reports that she does not drink alcohol and does not use drugs.    Review of Systems:  As per HPI.  Rest of the review of ten systems is negative or unremarkable except as stated above.    Physical Exam:  VITAL SIGNS:   Vitals:    01/16/22 1144   BP: 110/59   Pulse: 68  SpO2: 96%      Wt Readings from Last 3 Encounters:   01/16/22 93.9 kg (207 lb)   01/13/22 94.8 kg (209 lb)   12/30/21 93.3 kg (205 lb 11.2 oz)      Today's Body mass index is 33.41 kg/m??.   Height: 167.6 cm (5' 6)  CONSTITUTIONAL: chronically ill-appearing in no acute distress  EYES: Conjunctivae and sclerae clear and anicteric.  ENT: Benign.   CARDIOVASCULAR: JVP not seen above the clavicle with HOB at 90 degrees. Rate and rhythm are regular.  There is no lifts or heaves.  Normal S1, S2. There is no murmur, gallops or rubs.  Radial and pedal pulses are 2+, bilaterally.   There is no pedal edema, bilaterally.   RESPIRATORY: Normal respiratory effort. Clear to auscultation bilaterally..  There are no wheezes.  GASTROINTESTINAL: Soft, non-tender, with audible bowel sounds. Abdomen nondistended.  Liver is nonpalpable.  SKIN: No rashes, ecchymosis or petechiae.  Warm, well perfused.   MUSCULOSKELETAL:  no joint swelling   NEURO/PSYCH: Appropriate mood and affect. Alert and oriented to person, place, and time. No gross motor or sensory deficits evident.    Pertinent Laboratory Studies:   Office Visit on 12/30/2021   Component Date Value Ref Range Status   ??? Iron 12/30/2021 24 (L)  50 - 170 ug/dL Final   ??? TIBC 34/74/2595 337  250 - 425 ug/dL Final   ??? Iron Saturation (%) 12/30/2021 7 (L)  20 - 55 % Final   ??? BNP 12/30/2021 174.68 (H)  <=100 pg/mL Final   ??? Magnesium 12/30/2021 2.2  1.6 - 2.6 mg/dL Final   ??? Sodium 63/87/5643 139  135 - 145 mmol/L Final   ??? Potassium 12/30/2021 4.3  3.4 - 4.8 mmol/L Final   ??? Chloride 12/30/2021 102  98 - 107 mmol/L Final   ??? CO2 12/30/2021 26.1  20.0 - 31.0 mmol/L Final   ??? Anion Gap 12/30/2021 11  5 - 14 mmol/L Final   ??? BUN 12/30/2021 50 (H)  9 - 23 mg/dL Final   ??? Creatinine 12/30/2021 2.40 (H)  0.60 - 0.80 mg/dL Final   ??? BUN/Creatinine Ratio 12/30/2021 21   Final   ??? eGFR [name]-EPI (2021) Female 12/30/2021 20 (L)  >=60 mL/min/1.29m2 Final   ??? Glucose 12/30/2021 124  70 - 179 mg/dL Final   ??? Calcium 32/95/1884 9.7  8.7 - 10.4 mg/dL Final   ??? WBC 16/60/6301 7.9  3.6 - 11.2 10*9/L Final   ??? RBC 12/30/2021 4.29  3.95 - 5.13 10*12/L Final   ??? HGB 12/30/2021 10.6 (L)  11.3 - 14.9 g/dL Final   ??? HCT 60/09/9322 34.3  34.0 - 44.0 % Final   ??? MCV 12/30/2021 80.0  77.6 - 95.7 fL Final   ??? MCH 12/30/2021 24.8 (L)  25.9 - 32.4 pg Final   ??? MCHC 12/30/2021 31.0 (L)  32.0 - 36.0 g/dL Final   ??? RDW 55/73/2202 23.1 (H)  12.2 - 15.2 % Final   ??? MPV 12/30/2021 8.5  6.8 - 10.7 fL Final   ??? Platelet 12/30/2021 240  150 - 450 10*9/L Final   Appointment on 12/24/2021   Component Date Value Ref Range Status   ??? Sodium 12/24/2021 136  135 - 145 mmol/L Final   ??? Potassium 12/24/2021 4.1  3.4 - 4.8 mmol/L Final   ??? Chloride 12/24/2021 99  98 - 107 mmol/L Final   ??? CO2 12/24/2021 30.0  20.0 - 31.0 mmol/L Final   ???  Anion Gap 12/24/2021 7  5 - 14 mmol/L Final   ??? BUN 12/24/2021 67 (H)  9 - 23 mg/dL Final   ??? Creatinine 12/24/2021 2.83 (H)  0.60 - 0.80 mg/dL Final   ??? BUN/Creatinine Ratio 12/24/2021 24   Final   ??? eGFR [name]-EPI (2021) Female 12/24/2021 16 (L)  >=60 mL/min/1.47m2 Final   ??? Glucose 12/24/2021 190 (H)  70 - 179 mg/dL Final   ??? Calcium 81/19/1478 9.5  8.7 - 10.4 mg/dL Final   ??? Ferritin 29/56/2130 51.2  7.3 - 270.7 ng/mL Final   ??? WBC 12/24/2021 8.9  3.6 - 11.2 10*9/L Final   ??? RBC 12/24/2021 4.02  3.95 - 5.13 10*12/L Final   ??? HGB 12/24/2021 9.8 (L)  11.3 - 14.9 g/dL Final   ??? HCT 86/57/8469 31.8 (L)  34.0 - 44.0 % Final   ??? MCV 12/24/2021 79.1  77.6 - 95.7 fL Final   ??? MCH 12/24/2021 24.3 (L)  25.9 - 32.4 pg Final   ??? MCHC 12/24/2021 30.7 (L)  32.0 - 36.0 g/dL Final   ??? RDW 62/95/2841 22.7 (H)  12.2 - 15.2 % Final   ??? MPV 12/24/2021 8.7  6.8 - 10.7 fL Final   ??? Platelet 12/24/2021 325  150 - 450 10*9/L Final   ??? nRBC 12/24/2021 0  <=4 /100 WBCs Final   ??? Neutrophils % 12/24/2021 79.6  % Final   ??? Lymphocytes % 12/24/2021 9.3  % Final   ??? Monocytes % 12/24/2021 9.1  % Final   ??? Eosinophils % 12/24/2021 0.9  % Final   ??? Basophils % 12/24/2021 1.1  % Final   ??? Absolute Neutrophils 12/24/2021 7.1  1.8 - 7.8 10*9/L Final   ??? Absolute Lymphocytes 12/24/2021 0.8 (L)  1.1 - 3.6 10*9/L Final   ??? Absolute Monocytes 12/24/2021 0.8  0.3 - 0.8 10*9/L Final   ??? Absolute Eosinophils 12/24/2021 0.1  0.0 - 0.5 10*9/L Final   ??? Absolute Basophils 12/24/2021 0.1  0.0 - 0.1 10*9/L Final   ??? Anisocytosis 12/24/2021 Marked (A)  Not Present Final   ??? Smear Review Comments 12/24/2021    Final   Office Visit on 12/16/2021   Component Date Value Ref Range Status   ??? Sodium 12/16/2021 129 (L)  135 - 145 mmol/L Final   ??? Potassium 12/16/2021 3.8  3.4 - 4.8 mmol/L Final   ??? Chloride 12/16/2021 85 (L)  98 - 107 mmol/L Final   ??? CO2 12/16/2021 31.9 (H)  20.0 - 31.0 mmol/L Final   ??? Anion Gap 12/16/2021 12  5 - 14 mmol/L Final   ??? BUN 12/16/2021 92 (H)  9 - 23 mg/dL Final   ??? Creatinine 12/16/2021 4.66 (H)  0.60 - 0.80 mg/dL Final   ??? BUN/Creatinine Ratio 12/16/2021 20   Final   ??? eGFR [name]-EPI (2021) Female 12/16/2021 9 (L)  >=60 mL/min/1.37m2 Final   ??? Glucose 12/16/2021 212 (H)  70 - 179 mg/dL Final   ??? Calcium 32/44/0102 10.1  8.7 - 10.4 mg/dL Final   ??? BNP 12/16/2021 223.36 (H)  <=100 pg/mL Final   ??? Magnesium 12/16/2021 3.4 (H)  1.6 - 2.6 mg/dL Final   No results displayed because visit has over 200 results.      No results displayed because visit has over 200 results.      Office Visit on 11/14/2021   Component Date Value Ref Range Status   ??? Sodium 11/14/2021 138  135 - 145  mmol/L Final   ??? Potassium 11/14/2021 4.6  3.4 - 4.8 mmol/L Final   ??? Chloride 11/14/2021 97 (L)  98 - 107 mmol/L Final   ??? CO2 11/14/2021 33.2 (H)  20.0 - 31.0 mmol/L Final   ??? Anion Gap 11/14/2021 8  5 - 14 mmol/L Final   ??? BUN 11/14/2021 61 (H)  9 - 23 mg/dL Final   ??? Creatinine 11/14/2021 2.73 (H)  0.60 - 0.80 mg/dL Final   ??? BUN/Creatinine Ratio 11/14/2021 22   Final   ??? eGFR [name]-EPI (2021) Female 11/14/2021 17 (L)  >=60 mL/min/1.63m2 Final   ??? Glucose 11/14/2021 214 (H)  70 - 179 mg/dL Final   ??? Calcium 16/09/9603 9.0  8.7 - 10.4 mg/dL Final   ??? BNP 11/14/2021 300.97 (H)  <=100 pg/mL Final   ??? Sodium 11/14/2021 138  135 - 145 mmol/L Final   ??? Potassium 11/14/2021 5.0 (H)  3.4 - 4.8 mmol/L Final   ??? Chloride 11/14/2021 96 (L)  98 - 107 mmol/L Final   ??? CO2 11/14/2021 33.2 (H)  20.0 - 31.0 mmol/L Final   ??? Anion Gap 11/14/2021 9  5 - 14 mmol/L Final   ??? BUN 11/14/2021 63 (H)  9 - 23 mg/dL Final   ??? Creatinine 11/14/2021 2.77 (H)  0.60 - 0.80 mg/dL Final   ??? BUN/Creatinine Ratio 11/14/2021 23   Final   ??? eGFR [name]-EPI (2021) Female 11/14/2021 17 (L)  >=60 mL/min/1.78m2 Final   ??? Glucose 11/14/2021 217 (H)  70 - 179 mg/dL Final   ??? Calcium 54/08/8118 9.1  8.7 - 10.4 mg/dL Final   ??? WBC 14/78/2956 8.0  3.6 - 11.2 10*9/L Final   ??? RBC 11/14/2021 3.87 (L)  3.95 - 5.13 10*12/L Final   ??? HGB 11/14/2021 9.6 (L)  11.3 - 14.9 g/dL Final   ??? HCT 21/30/8657 31.2 (L)  34.0 - 44.0 % Final   ??? MCV 11/14/2021 80.5  77.6 - 95.7 fL Final   ??? MCH 11/14/2021 24.9 (L)  25.9 - 32.4 pg Final   ??? MCHC 11/14/2021 30.9 (L)  32.0 - 36.0 g/dL Final   ??? RDW 84/69/6295 17.6 (H)  12.2 - 15.2 % Final   ??? MPV 11/14/2021 8.1  6.8 - 10.7 fL Final   ??? Platelet 11/14/2021 274  150 - 450 10*9/L Final   ??? nRBC 11/14/2021 0  <=4 /100 WBCs Final   ??? Neutrophils % 11/14/2021 77.2  % Final   ??? Lymphocytes % 11/14/2021 10.2  % Final   ??? Monocytes % 11/14/2021 8.6  % Final   ??? Eosinophils % 11/14/2021 2.5  % Final   ??? Basophils % 11/14/2021 1.5  % Final   ??? Absolute Neutrophils 11/14/2021 6.2  1.8 - 7.8 10*9/L Final   ??? Absolute Lymphocytes 11/14/2021 0.8 (L)  1.1 - 3.6 10*9/L Final   ??? Absolute Monocytes 11/14/2021 0.7  0.3 - 0.8 10*9/L Final   ??? Absolute Eosinophils 11/14/2021 0.2  0.0 - 0.5 10*9/L Final   ??? Absolute Basophils 11/14/2021 0.1  0.0 - 0.1 10*9/L Final   ??? Anisocytosis 11/14/2021 Slight (A)  Not Present Final   Office Visit on 10/23/2021   Component Date Value Ref Range Status   ??? Creat U 10/23/2021 44.6  Undefined mg/dL Final   ??? Protein, Ur 10/23/2021 <6.0  Undefined mg/dL Final   ??? Protein/Creatinine Ratio, Urine 10/23/2021    Final   ??? Creat U 10/23/2021 44.7  Undefined mg/dL Final   ???  Albumin Quantitative, Urine 10/23/2021 <0.3  Undefined mg/dL Final   ??? Albumin/Creatinine Ratio 10/23/2021    Final   ??? Spec Gravity/POC 10/23/2021 1.015  1.003 - 1.030 Final   ??? PH/POC 10/23/2021 6.5  5.0 - 9.0 Final   ??? Leuk Esterase/POC 10/23/2021 Negative  Negative Final   ??? Nitrite/POC 10/23/2021 Negative  Negative Final   ??? Protein/POC 10/23/2021 Negative  Negative Final   ??? UA Glucose/POC 10/23/2021 Negative  Negative Final   ??? Ketones, POC 10/23/2021 Negative  Negative Final   ??? Bilirubin/POC 10/23/2021 Negative  Negative Final   ??? Blood/POC 10/23/2021 Negative  Negative Final   ??? Urobilinogen/POC 10/23/2021 0.2  0.2 - 1.0 mg/dL Final       Lab Results   Component Value Date    PRO-BNP 3,420.0 (H) 07/03/2021    PRO-BNP 905.0 (H) 03/21/2021    PRO-BNP 3,796.0 (H) 12/23/2020    Creatinine 2.40 (H) 12/30/2021    Creatinine 2.83 (H) 12/24/2021    Creatinine 0.79 01/06/2011    BUN 50 (H) 12/30/2021    BUN 67 (H) 12/24/2021    BUN 20 01/06/2011    Sodium 139 12/30/2021    Sodium 140 01/06/2011    Potassium 4.3 12/30/2021    Potassium 4.1 01/06/2011    CO2 26.1 12/30/2021    CO2 30 01/06/2011    Magnesium 2.2 12/30/2021    Magnesium 2.0 01/06/2011    Total Bilirubin 0.5 12/09/2021    INR 2.15 07/01/2021    INR 1.0 01/06/2011       Lab Results   Component Value Date    Digoxin Level 0.9 10/01/2020       Lab Results   Component Value Date    TSH 2.731 12/06/2021    Cholesterol 95 12/06/2021    Triglycerides 66 12/06/2021    HDL 42 12/06/2021    Non-HDL Cholesterol 53 (L) 12/06/2021    LDL Calculated 40 12/06/2021       Lab Results Component Value Date    WBC 7.9 12/30/2021    WBC 12.5 (H) 01/06/2011    HGB 10.6 (L) 12/30/2021    HGB 12.9 01/06/2011    HCT 34.3 12/30/2021    HCT 40.8 01/06/2011    Platelet 240 12/30/2021    Platelet 260 01/06/2011       Pertinent Test Results from Today:  None    Other pertinent records were reviewed.    The following are further history from the patient's EPIC record for reference:     Past Medical History:   Diagnosis Date   ??? Acute kidney injury superimposed on chronic kidney disease (CMS-HCC) 10/11/2020   ??? Acute on chronic diastolic (congestive) heart failure (CMS-HCC) 08/23/2020   ??? Arthritis    ??? Calculus of kidney    ??? Calculus of ureter    ??? CHF (congestive heart failure) (CMS-HCC)    ??? Chronic atrial fibrillation (CMS-HCC) 07/20/2019   ??? COPD (chronic obstructive pulmonary disease) (CMS-HCC)    ??? Diabetes (CMS-HCC)    ??? Gangrenous cholecystitis 10/11/2020   ??? GERD (gastroesophageal reflux disease)    ??? Hydronephrosis    ??? Hypertension    ??? Hyponatremia 10/11/2020   ??? Intermediate coronary syndrome (CMS-HCC) 03/13/2014   ??? Lower extremity edema 09/28/2020   ??? Lumbar stenosis    ??? Microscopic hematuria    ??? Nausea alone    ??? Nephrolithiasis 04/17/2016   ??? Neuropathy    ??? Nocturia    ??? Other chronic  cystitis    ??? Pulmonary hypertension (CMS-HCC)    ??? Renal colic    ??? Sleep apnea    ??? Unstable angina pectoris (CMS-HCC) 03/13/2014       Past Surgical History:   Procedure Laterality Date   ??? BACK SURGERY  1995   ??? CARPAL TUNNEL RELEASE Left 2014   ??? HYSTERECTOMY  1971   ??? IR INSERT CHOLECYSTOSMY TUBE PERCUTANEOUS  10/02/2020    IR INSERT CHOLECYSTOSMY TUBE PERCUTANEOUS 10/02/2020 Braulio Conte, MD IMG [facility]   ??? LUMBAR DISC SURGERY     ??? PR REMOVAL GALLBLADDER N/A 10/06/2020    Procedure: CHOLECYSTECTOMY;  Surgeon: Katherina Mires, MD;  Location: MAIN OR [facility];  Service: Trauma   ??? PR RIGHT HEART CATH O2 SATURATION & CARDIAC OUTPUT N/A 09/30/2020    Procedure: Right Heart Catheterization;  Surgeon: Neal Dy, MD;  Location: [facility] CATH;  Service: Cardiology   ??? PR RIGHT HEART CATH O2 SATURATION & CARDIAC OUTPUT N/A 01/09/2021    Procedure: Right Heart Catheterization;  Surgeon: Lesle Reek, MD;  Location: [facility] CATH;  Service: Cardiology         Documentation assistance was provided by Binnie Kand, Scribe on January 16, 2022 at 11:30 AM for Carin Hock, MD, PhD.     I have reviewed the documentation provided by the scribe and confirm that it accurately reflects the service I personally performed and the decisions made by me.  Signature: MB  Date: 01/16/2022  Time: 2:11 PM

## 2022-01-16 NOTE — Unmapped (Signed)
Today,    We discussed cardioMEMs. You will let us know if you decide to proceed with it.     MEDICATIONS:  We are changing your medications today.    Start zoloft - 1/2 a pill for the first 5 days than increase to a full pill daily after that.     Call if you have questions about your medications.    LABS:  We will call you if your labs need attention.    NEXT APPOINTMENT:  Return to clinic in 3 weeks with Derwood Kaplan NP to get blood work, check in on your fluid status and how you are feeling after starting zoloft.     In general, to take care of your heart failure:  -Limit your fluid intake to 2 Liters (half-gallon) per day.    -Limit your salt intake to ideally 2-3 grams (2000-3000 mg) per day.  -Weigh yourself daily and record, and bring that weight diary to your next appointment.  (Weight gain of 2-3 pounds in 1 day typically means fluid weight.)    The medications for your heart are to help your heart and help you live longer.    Please contact us before stopping any of your heart medications.    Call the clinic at [phone] with questions.  Our clinic fax number is [phone].  If you need to reschedule future appointments, please call [phone] or [phone]  My office number (c/o De Burrs RN) is [phone] if you need further assistance.  After office hours, if you have urgent questions/problems, contact the on-call cardiologist through the hospital operator: [phone].    Please do not send a MyChart message for potentially life-threatening symptoms.  Please call 911 for a true medical emergency.    To learn more about heart failure, please read [place]'s Learning to Live with Heart Failure:  [email or web address].    Available online at   [email or web address] - then click the link Learning to Live with Heart Failure patient booklet  OR  [email or web address] - open the window for Medical Management and click the link Living with Heart Failure

## 2022-01-22 ENCOUNTER — Ambulatory Visit: Admit: 2022-01-22 | Discharge: 2022-01-23 | Payer: MEDICARE | Attending: Nephrology | Primary: Nephrology

## 2022-01-22 DIAGNOSIS — N179 Acute kidney failure, unspecified: Principal | ICD-10-CM

## 2022-01-22 NOTE — Unmapped (Signed)
Dear Ms Martian    It is really nice to see you.  I am glad that you are doing better today.  December was really rough on you.  The most recent kidney labs look really good.  I would not make any changes in your medications.    In reviewing your other labs, I noticed that your iron studies are low.  I will plan to message the cardiology provider to see if they can set up an infusion of iron.  After you get your first infusion of iron, we can plan on getting the Epo set up at our clinic.    Take care, and see you in about 3 months    Sincerely    Dr. Kirtland Bouchard

## 2022-01-22 NOTE — Unmapped (Signed)
Hello referring Provider: Jaceion Huber, Barbara Huber*     PCP:  Barbara Doe, MD      01/22/2022      ASSESSMENT/PLAN:      [name] is a 81 y.o. year old patient with a past medical history significant for diabetes mellitus, pretension, COPD and a recent diagnosis of cardiac amyloid.  She is being seen in follow-up for chronic kidney disease.     1.  Chronic kidney disease  Stage IV.  Likely etiology combination of diabetes mellitus and hypertension.  It is unlikely that there is amyloid infiltration of the kidneys given the type of amyloidosis that she has.  I took this opportunity to review late stage [name] management, especially avoidance of nephrotoxins such as NSAIDs, and judicious use of any medications.  She is on a statin agent.  It is unclear why she is not on ACE inhibitor, however her blood pressure is well controlled, and she does not have proteinuria. I did not discuss dialysis at this time.    2.  Diabetes mellitus type 2  She is currently on insulin.  She is not on an SGLT2 inhibitor or GLP-1 receptor agonist.     3.  Hypertension  This appears to be controlled on the current regimen of spironolactone, and torsemide, and metolazone as needed.    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    5.  Atrial fibrillation  This appears to be stable.  She is on apixaban and amiodarone.    6.  Chronic obstructive pulmonary disease, O2 dependent  Stable.      [name] will follow up in 3 months.             Background: Patient has chronic kidney disease stage IV plan latest EGFR= 40ml/min and cardiac amyloid.  Etiology is not clear, but risk factors include hypertension and diabetes mellitus.  She does not have any proteinuria or albuminuria.  The lack of albuminuria can be seen in 30% of patients with diabetes and low GFR.      HPI:  [name] to the [facility] nephrology clinic for scheduled follow-up.  I last saw her about 3 months ago.  She is accompanied by her daughter today.    In the interval, she has been hospitalized twice.      Her first hospitalization was from 11/29/2021 until 12/02/2021 with acute on chronic diastolic congestive heart failure.  She was also found to have acute on chronic kidney injury with serum creatinine increased to 3.15 from a baseline of 2.5.  She was discharged home with higher doses of diuretics.  She returned on 12/06/2021 to the hospital with primarily acute kidney injury, with peak creatinine up to 5.01, and nadir GFR of 8 mL/min.    Since discharge, she has had at least 2 checks of her kidney function.  The most recent from December 30, 2021 is serum creatinine 2.4, with an EGFR of 20 mL/min.  She was discharged home on 40 mg torsemide twice daily, and metolazone 5 mg daily as needed if weight is elevated.  She is in regular contact with a provider at the heart failure clinic with respect to dosing metolazone.  She also remains on spironolactone 25 mg/day.    She feels well, and has no complaints at this time.  Her exercise tolerance is limited, but she denies any shortness of breath, chest pain or chest tightness.  She denies any urinary symptoms such as dysuria, gross  hematuria, incomplete voiding.    Otherwise as per ROS      ROS:   CONSTITUTIONAL: denies fevers or chills, denies unintentional weight loss  CARDIOVASCULAR: denies chest pain, denies dyspnea on exertion, denies leg edema  GASTROINTESTINAL: denies nausea, denies vomiting, denies anorexia  GENITOURINARY: denies dysuria, denies hematuria, denies decreased urinary stream  All systems reviewed and are negative except as listed above.    PAST MEDICAL HISTORY:  Past Medical History:   Diagnosis Date   ??? Acute kidney injury superimposed on chronic kidney disease (CMS-HCC) 10/11/2020   ??? Acute on chronic diastolic (congestive) heart failure (CMS-HCC) 08/23/2020   ??? Arthritis    ??? Calculus of kidney    ??? Calculus of ureter    ??? CHF (congestive heart failure) (CMS-HCC)    ??? Chronic atrial fibrillation (CMS-HCC) 07/20/2019   ??? COPD (chronic obstructive pulmonary disease) (CMS-HCC)    ??? Diabetes (CMS-HCC)    ??? Gangrenous cholecystitis 10/11/2020   ??? GERD (gastroesophageal reflux disease)    ??? Hydronephrosis    ??? Hypertension    ??? Hyponatremia 10/11/2020   ??? Intermediate coronary syndrome (CMS-HCC) 03/13/2014   ??? Lower extremity edema 09/28/2020   ??? Lumbar stenosis    ??? Microscopic hematuria    ??? Nausea alone    ??? Nephrolithiasis 04/17/2016   ??? Neuropathy    ??? Nocturia    ??? Other chronic cystitis    ??? Pulmonary hypertension (CMS-HCC)    ??? Renal colic    ??? Sleep apnea    ??? Unstable angina pectoris (CMS-HCC) 03/13/2014       ALLERGIES  Nitrofurantoin and Lipitor [atorvastatin]                 MEDICATIONS:  Current Outpatient Medications   Medication Sig Dispense Refill   ??? ACCU-CHEK AVIVA PLUS TEST STRP Strp UESE TO CHECK BLOOD SUGAR 3 TIMES A DAY BEFORE MEALS 100 each 11   ??? acetaminophen (TYLENOL) 500 MG tablet Take 1,000 mg by mouth daily as needed for pain.      ??? ADVAIR HFA 115-21 mcg/actuation inhaler INHALE TWO PUFFS BY MOUTH TWICE A DAY 12 g 11   ??? albuterol HFA 90 mcg/actuation inhaler Inhale 2 puffs every eight (8) hours as needed for wheezing.     ??? amiodarone (PACERONE) 200 MG tablet Take 1 tablet (200 mg total) by mouth daily. 90 tablet 3   ??? apixaban (ELIQUIS) 2.5 mg Tab Take 1 tablet (2.5 mg total) by mouth Two (2) times a day. 60 tablet 11   ??? cranberry 500 mg cap Take 500 mg by mouth daily with evening meal.     ??? estradioL (ESTRACE) 0.01 % (0.1 mg/gram) vaginal cream INSERT ONE GRAM VAGINALLY AT BEDTIME     ??? fosfomycin (MONUROL) 3 gram Pack Take 3 g by mouth once a week. 36 g 0   ??? insulin glargine (BASAGLAR, LANTUS) 100 unit/mL (3 mL) injection pen Inject 0.04 mL (4 Units total) under the skin nightly. 15 mL 3   ??? lancets Misc 1 each by Miscellaneous route daily. Accu Check 100 each 6   ??? metOLazone (ZAROXOLYN) 5 MG tablet Take 1 tablet (5 mg total) by mouth daily as needed (when instructed by cardiology clinic). 30 tablet 1   ??? montelukast (SINGULAIR) 10 mg tablet Take 1 tablet (10 mg total) by mouth daily. 90 tablet 3   ??? NARCAN 4 mg/actuation nasal spray 1 spray into alternating nostrils once as needed (opioid overdose).  PRN - Emergency use. (Patient not taking: Reported on 12/16/2021)     ??? NON FORMULARY APPLY FROM NECK DOWN TWICE A DAY     ??? oxyCODONE (OXYCONTIN) 10 mg TR12 12 hr crush resistant ER/CR tablet Take 10 mg by mouth every twelve (12) hours.     ??? OXYGEN-AIR DELIVERY SYSTEMS MISC 5 L by Miscellaneous route. Currently using 3  L/min via [place]     ??? pantoprazole (PROTONIX) 20 MG tablet Take 1 tablet (20 mg total) by mouth in the morning. 60 tablet 5   ??? 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 rosuvastatin (CRESTOR) 5 MG tablet Take 1 tablet (5 mg total) by mouth every other day. 15 tablet 11   ??? sertraline (ZOLOFT) 50 MG tablet Take 1 tablet (50 mg total) by mouth daily. 90 tablet 3   ??? SPIRIVA RESPIMAT 2.5 mcg/actuation inhalation mist INHALE TWO PUFFS BY MOUTH ONCE DAILY 4 g 11   ??? spironolactone (ALDACTONE) 25 MG tablet Take 25 mg by mouth in the morning.     ??? tafamidis (VYNDAMAX) 61 mg cap Take 1 capsule (61 mg) by mouth daily. 30 capsule 11   ??? torsemide (DEMADEX) 10 MG tablet Take 4 tablets (40 mg total) by mouth daily. 120 tablet 0   ??? varicella-zoster [id number], PF, (SHINGRIX, PF,) 50 mcg/0.5 mL SusR injection Inject 0.5 mL into the muscle. 0.5 mL 1     No current facility-administered medications for this visit.       PHYSICAL EXAM:  Vitals:    01/22/22 1352   BP: 114/49   Pulse: 61   Temp: 36.2 ??C (97.2 ??F)   SpO2: 100%     CONSTITUTIONAL: Alert,well appearing, no distress, seated in wheelchair  HEENT: Moist mucous membranes, oropharynx clear without erythema or exudate  EYES:  Pupils reactive, sclerae anicteric.  NECK: Supple  CARDIOVASCULAR: Regular, normal S1/S2 heart sounds, no murmurs, no rubs.   PULM: Clear to auscultation bilaterally  GASTROINTESTINAL: Soft, active bowel sounds, nontender  EXTREMITIES: No lower extremity edema bilaterally.   SKIN: No rashes or lesions  NEUROLOGIC: No focal motor or sensory deficits    MEDICAL DECISION MAKING      Urine Dipstick:  Urine Microscopy:     IMAGING STUDIES: Pending

## 2022-01-22 NOTE — Unmapped (Signed)
AOBP: right   arm large   cuff   Average: 114/49 Pulse:61  1st reading:111/50  Pulse:61  2nd reading: 114/47 Pulse:61  3rd reading:117/49  Pulse:60

## 2022-01-22 NOTE — Unmapped (Signed)
Pt presented to clinic for follow-up appointment. Pt using home oxygen device during visit, stated oxygen supply was running low, requests to use clinic supply oxygen. Pt stated her current setting was set at 3L/min.  Nurse connected pt to oxygen tank at 3L. O2 sat taken. Pt under observation during MD care.

## 2022-01-28 NOTE — Unmapped (Signed)
[facility] Clinical Assessment & Refill Coordination Note    Barbara Huber, DOB: 1941/01/07  Phone: [phone] (home)     All above HIPAA information was verified with patient.     Was a Nurse, learning disability used for this call? No    Specialty Medication(s):   General Specialty: Vyndamax     Current Outpatient Medications   Medication Sig Dispense Refill   ??? ACCU-CHEK AVIVA PLUS TEST STRP Strp UESE TO CHECK BLOOD SUGAR 3 TIMES A DAY BEFORE MEALS 100 each 11   ??? acetaminophen (TYLENOL) 500 MG tablet Take 1,000 mg by mouth daily as needed for pain.      ??? ADVAIR HFA 115-21 mcg/actuation inhaler INHALE TWO PUFFS BY MOUTH TWICE A DAY 12 g 11   ??? albuterol HFA 90 mcg/actuation inhaler Inhale 2 puffs every eight (8) hours as needed for wheezing.     ??? amiodarone (PACERONE) 200 MG tablet Take 1 tablet (200 mg total) by mouth daily. 90 tablet 3   ??? apixaban (ELIQUIS) 2.5 mg Tab Take 1 tablet (2.5 mg total) by mouth Two (2) times a day. 60 tablet 11   ??? cranberry 500 mg cap Take 500 mg by mouth daily with evening meal.     ??? estradioL (ESTRACE) 0.01 % (0.1 mg/gram) vaginal cream INSERT ONE GRAM VAGINALLY AT BEDTIME     ??? fosfomycin (MONUROL) 3 gram Pack Take 3 g by mouth once a week. 36 g 0   ??? insulin glargine (BASAGLAR, LANTUS) 100 unit/mL (3 mL) injection pen Inject 0.04 mL (4 Units total) under the skin nightly. 15 mL 3   ??? lancets Misc 1 each by Miscellaneous route daily. Accu Check 100 each 6   ??? metOLazone (ZAROXOLYN) 5 MG tablet Take 1 tablet (5 mg total) by mouth daily as needed (when instructed by cardiology clinic). 30 tablet 1   ??? montelukast (SINGULAIR) 10 mg tablet Take 1 tablet (10 mg total) by mouth daily. 90 tablet 3   ??? NARCAN 4 mg/actuation nasal spray 1 spray into alternating nostrils once as needed (opioid overdose). PRN - Emergency use.     ??? NON FORMULARY APPLY FROM NECK DOWN TWICE A DAY     ??? oxyCODONE (OXYCONTIN) 10 mg TR12 12 hr crush resistant ER/CR tablet Take 10 mg by mouth every twelve (12) hours.     ??? OXYGEN-AIR DELIVERY SYSTEMS MISC 5 L by Miscellaneous route. Currently using 3  L/min via [place]     ??? pantoprazole (PROTONIX) 20 MG tablet Take 1 tablet (20 mg total) by mouth in the morning. 60 tablet 5   ??? 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 rosuvastatin (CRESTOR) 5 MG tablet Take 1 tablet (5 mg total) by mouth every other day. 15 tablet 11   ??? sertraline (ZOLOFT) 50 MG tablet Take 1 tablet (50 mg total) by mouth daily. 90 tablet 3   ??? SPIRIVA RESPIMAT 2.5 mcg/actuation inhalation mist INHALE TWO PUFFS BY MOUTH ONCE DAILY 4 g 11   ??? spironolactone (ALDACTONE) 25 MG tablet Take 25 mg by mouth in the morning.     ??? tafamidis (VYNDAMAX) 61 mg cap Take 1 capsule (61 mg) by mouth daily. 30 capsule 11   ??? torsemide (DEMADEX) 10 MG tablet Take 4 tablets (40 mg total) by mouth daily. 120 tablet 0   ???  varicella-zoster [id number], PF, (SHINGRIX, PF,) 50 mcg/0.5 mL SusR injection Inject 0.5 mL into the muscle. 0.5 mL 1     No current facility-administered medications for this visit.        Changes to medications: Nyaisha reports no changes at this time.    Allergies   Allergen Reactions   ??? Nitrofurantoin Anaphylaxis and Other (See Comments)   ??? Lipitor [Atorvastatin] Muscle Pain     SOB, Headache, fatigue, sick on my stomach       Changes to allergies: No    SPECIALTY MEDICATION ADHERENCE     Vyndamax 61 mg: ~10 days of medicine on hand       Medication Adherence    Patient reported X missed doses in the last month: 0  Specialty Medication: Vyndamax 61mg   Informant: patient          Specialty medication(s) dose(s) confirmed: Regimen is correct and unchanged.     Are there any concerns with adherence? No    Adherence counseling provided? Not needed    CLINICAL MANAGEMENT AND INTERVENTION      Clinical Benefit Assessment:    Do you feel the medicine is effective or helping your condition? Yes    Clinical Benefit counseling provided? Progress note from 01/16/22 shows evidence of clinical benefit    Adverse Effects Assessment:    Are you experiencing any side effects? No    Are you experiencing difficulty administering your medicine? 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 For example, brushing your teeth or getting up in the morning. Patient declined to answer    Have you discussed this with your provider? Not needed    Acute Infection Status:    Acute infections noted within Epic:  No active infections  Patient reported infection: None    Therapy Appropriateness:    Is therapy appropriate and patient progressing towards therapeutic goals? Yes, therapy is appropriate and should be continued    DISEASE/MEDICATION-SPECIFIC INFORMATION      N/A    PATIENT SPECIFIC NEEDS     - Does the patient have any physical, cognitive, or cultural barriers? No    - Is the patient high risk? No    - Does the patient require a Care Management Plan? No     SOCIAL DETERMINANTS OF HEALTH     At the [facility], we have learned that life circumstances - like trouble affording food, housing, utilities, or transportation can affect the health of many of our patients.   That is why we wanted to ask: are you currently experiencing any life circumstances that are negatively impacting your health and/or quality of life? Patient declined to answer    Social Determinants of Health     Food Insecurity: No Food Insecurity   ??? Worried About Programme researcher, broadcasting/film/video in the Last Year: Never true   ??? Ran Out of Food in the Last Year: Never true   Tobacco Use: Medium Risk   ??? Smoking Tobacco Use: Former   ??? Smokeless Tobacco Use: Never   ??? Passive Exposure: Not on file   Transportation Needs: No Transportation Needs   ??? Lack of Transportation (Medical): No   ??? Lack of Transportation (Non-Medical): No   Alcohol Use: Not At Risk   ??? How often do you have a drink containing alcohol?: Never   ??? How  many drinks containing alcohol do you have on a typical day when you are drinking?: 1 - 2   ??? How often do you have 5 or more drinks on one occasion?: Never   Housing/Utilities: Low Risk    ??? Within the past 12 months, have you ever stayed: outside, in a car, in a tent, in an overnight shelter, or temporarily in someone else's home (i.e. couch-surfing)?: No   ??? Are you worried about losing your housing?: No   ??? Within the past 12 months, have you been unable to get utilities (heat, electricity) when it was really needed?: No   Substance Use: Low Risk    ??? Taken prescription drugs for non-medical reasons: Never   ??? Taken illegal drugs: Never   ??? Patient indicated they have taken drugs in the past year for non-medical reasons: Yes, [positive answer(s)]: Not on file   Financial Resource Strain: Low Risk    ??? Difficulty of Paying Living Expenses: Not hard at all   Physical Activity: Not on file   Health Literacy: Medium Risk   ??? : Rarely   Stress: Not on file   Intimate Partner Violence: Not At Risk   ??? Fear of Current or Ex-Partner: No   ??? Emotionally Abused: No   ??? Physically Abused: No   ??? Sexually Abused: No   Depression: Not at risk   ??? PHQ-2 Score: 0   Social Connections: Not on file       Would you be willing to receive help with any of the needs that you have identified today? Not applicable       SHIPPING     Specialty Medication(s) to be Shipped:   General Specialty: Vyndamax    Other medication(s) to be shipped: No additional medications requested for fill at this time     Changes to insurance: No    Delivery Scheduled: Yes, Expected medication delivery date: 02/04/22.     Medication will be delivered via Next Day Courier to the confirmed prescription address in [facility].    The patient will receive a drug information handout for each medication shipped and additional FDA Medication Guides as required.  Verified that patient has previously received a Conservation officer, historic buildings and a Surveyor, mining.    The patient or caregiver noted above participated in the development of this care plan and knows that they can request review of or adjustments to the care plan at any time.      All of the patient's questions and concerns have been addressed.    Camillo Flaming   [facility] Specialty Pharmacist

## 2022-01-29 MED ORDER — GABAPENTIN 100 MG CAPSULE
ORAL_CAPSULE | Freq: Every day | ORAL | 3 refills | 135.00000 days | Status: CP | PRN
Start: 2022-01-29 — End: 2023-01-29

## 2022-02-02 DIAGNOSIS — I43 Cardiomyopathy in diseases classified elsewhere: Principal | ICD-10-CM

## 2022-02-02 DIAGNOSIS — E854 Organ-limited amyloidosis: Principal | ICD-10-CM

## 2022-02-03 MED FILL — VYNDAMAX 61 MG CAPSULE: ORAL | 30 days supply | Qty: 30 | Fill #1

## 2022-02-13 ENCOUNTER — Ambulatory Visit: Admit: 2022-02-13 | Discharge: 2022-02-14 | Payer: MEDICARE | Attending: Adult Health | Primary: Adult Health

## 2022-02-13 DIAGNOSIS — I43 Cardiomyopathy in diseases classified elsewhere: Principal | ICD-10-CM

## 2022-02-13 DIAGNOSIS — I4819 Other persistent atrial fibrillation: Principal | ICD-10-CM

## 2022-02-13 DIAGNOSIS — I10 Essential (primary) hypertension: Principal | ICD-10-CM

## 2022-02-13 DIAGNOSIS — I5032 Chronic diastolic (congestive) heart failure: Principal | ICD-10-CM

## 2022-02-13 DIAGNOSIS — J42 Unspecified chronic bronchitis: Principal | ICD-10-CM

## 2022-02-13 DIAGNOSIS — D500 Iron deficiency anemia secondary to blood loss (chronic): Principal | ICD-10-CM

## 2022-02-13 DIAGNOSIS — E854 Organ-limited amyloidosis: Principal | ICD-10-CM

## 2022-02-13 LAB — CBC
HEMATOCRIT: 30.2 % — ABNORMAL LOW (ref 34.0–44.0)
HEMOGLOBIN: 9.4 g/dL — ABNORMAL LOW (ref 11.3–14.9)
MEAN CORPUSCULAR HEMOGLOBIN CONC: 31 g/dL — ABNORMAL LOW (ref 32.0–36.0)
MEAN CORPUSCULAR HEMOGLOBIN: 24.8 pg — ABNORMAL LOW (ref 25.9–32.4)
MEAN CORPUSCULAR VOLUME: 80.2 fL (ref 77.6–95.7)
MEAN PLATELET VOLUME: 7.9 fL (ref 6.8–10.7)
PLATELET COUNT: 275 10*9/L (ref 150–450)
RED BLOOD CELL COUNT: 3.77 10*12/L — ABNORMAL LOW (ref 3.95–5.13)
RED CELL DISTRIBUTION WIDTH: 19.3 % — ABNORMAL HIGH (ref 12.2–15.2)
WBC ADJUSTED: 8 10*9/L (ref 3.6–11.2)

## 2022-02-13 LAB — MAGNESIUM: MAGNESIUM: 3.7 mg/dL — ABNORMAL HIGH (ref 1.6–2.6)

## 2022-02-13 LAB — BASIC METABOLIC PANEL
ANION GAP: 6 mmol/L (ref 5–14)
BLOOD UREA NITROGEN: 66 mg/dL — ABNORMAL HIGH (ref 9–23)
BUN / CREAT RATIO: 21
CALCIUM: 9 mg/dL (ref 8.7–10.4)
CHLORIDE: 97 mmol/L — ABNORMAL LOW (ref 98–107)
CO2: 34.3 mmol/L — ABNORMAL HIGH (ref 20.0–31.0)
CREATININE: 3.2 mg/dL — ABNORMAL HIGH
EGFR CKD-EPI (2021) FEMALE: 14 mL/min/{1.73_m2} — ABNORMAL LOW (ref >=60)
GLUCOSE RANDOM: 142 mg/dL (ref 70–179)
POTASSIUM: 4.4 mmol/L (ref 3.4–4.8)
SODIUM: 137 mmol/L (ref 135–145)

## 2022-02-13 LAB — IRON PANEL
IRON SATURATION: 9 % — ABNORMAL LOW (ref 20–55)
IRON: 31 ug/dL — ABNORMAL LOW
TOTAL IRON BINDING CAPACITY: 345 ug/dL (ref 250–425)

## 2022-02-13 LAB — B-TYPE NATRIURETIC PEPTIDE: B-TYPE NATRIURETIC PEPTIDE: 294.18 pg/mL — ABNORMAL HIGH (ref <=100)

## 2022-02-13 LAB — FERRITIN: FERRITIN: 13.9 ng/mL

## 2022-02-13 MED ORDER — TORSEMIDE 20 MG TABLET
ORAL_TABLET | ORAL | 3 refills | 0.00000 days | Status: CP
Start: 2022-02-13 — End: 2023-02-13

## 2022-02-13 MED ORDER — SERTRALINE 50 MG TABLET
ORAL_TABLET | Freq: Every day | ORAL | 3 refills | 90 days | Status: CP
Start: 2022-02-13 — End: 2023-02-13

## 2022-02-13 NOTE — Unmapped (Signed)
Today,    MEDICATIONS:  NO medication changes today.    Call if you have questions about your medications.    LABS:  We will call you if your labs need attention.    NEXT APPOINTMENT:  Return to clinic in 1 months with Derwood Kaplan NP  Call Lyla Son if you decide about the cardioMEMS [phone]      In general, to take care of your heart failure:  -Limit your fluid intake to 2 Liters (half-gallon) per day.    -Limit your salt intake to ideally 2-3 grams (2000-3000 mg) per day.  -Weigh yourself daily and record, and bring that weight diary to your next appointment.  (Weight gain of 2-3 pounds in 1 day typically means fluid weight.)    The medications for your heart are to help your heart and help you live longer.    Please contact us before stopping any of your heart medications.    Call the clinic at [phone] with questions.  Our clinic fax number is [phone].  If you need to reschedule future appointments, please call [phone] or [phone]  My office number (c/o De Burrs RN) is [phone] if you need further assistance.  After office hours, if you have urgent questions/problems, contact the on-call cardiologist through the hospital operator: [phone].    Please do not send a MyChart message for potentially life-threatening symptoms.  Please call 911 for a true medical emergency.    To learn more about heart failure, please read [employer]'s Learning to Live with Heart Failure:  [email or web address].    Available online at   [email or web address] - then click the link Learning to Live with Heart Failure patient booklet  OR  [email or web address] - open the window for Medical Management and click the link Living with Heart Failure

## 2022-02-13 NOTE — Unmapped (Addendum)
[facility] Note    Referring Provider: Artelia Laroche, MD  [address]  [address]  [place],  [place] [address]   Primary Provider: Jacquiline Doe, MD  [address]  [place] [address]   Other Providers:  Dr Luretha Murphy    Reason for Visit:  Barbara Huber is a 81 y.o. female being seen for routine visit and continued care of cardiac amyloidosis.    Assessment & Plan:  1. 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   - Moderately increased wall thickness with low voltage ECG - she's had issues with hypotension on ARB and BB, as well as atrial arrhythmias. SPEP/UPEP/free light chains without concern. 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 She has one daughter who accompanies her and is informed.   - DC'd Jardiance 10mg  daily due to recurrent UTIs  - She appears euvolemic on exam today on torsemide 40mg  daily with an extra 20mg  about every other day. Cr is increased, so will stop the extra 20mg  every other day and use only PRN for weight gain of 3lbs overnight or 5lbs in a week.   - Continue Tafamidis 61 mg daily   - Continue spironolactone 25 mg daily   1b. AKI  - As above, creatinine increased today (2.4 --> 3.2). Will reduce torsemide a bit.  - Again discussed CardioMEMS - she would benefit given fluctuations in volume status and renal function. She is still considering it.     2. Persistent atrial fibrillation  - CHA2DS2-VASc score = 5 (1-gender, 2-Age, 1-DM, 1-HTN)  - On Eliquis 2.5mg  BID (reduced dose for age, renal function) - Hgb 9.4. Decreased since starting back the Eliquis. If it continues to trend down, consider stopping all together.   - She had been in afib since at least 07/2020; now s/p cardioversion 01/10/21 and continues to be in NSR since  - On amiodarone 200mg  daily - increased it back herself after seeing no improvement in tremors/jerking with decreased dose    3. COPD  - home O2 3L [place] - followed by pulm  - on Advair and Spiriva    4. Iron deficiency anemia  - s/p 5 IV iron sessions   - Hgb low during admission (7.3 on 12/13). Improved to 9.3 on discharge, 9.4 today  - Received IV iron on 12/13.   - Recheck iron panel/ferritin today - iron infusion ordered in January for TSAT <16% but never scheduled. If still low, will message infusion clinic to assist w/ scheduling    5. Anxiety  - Tolerating Zoloft 25mg  with improved moved/anxiety    Follow-up:  Return in about 1 month (around 03/13/2022) for Return HF.     History of Present Illness:  Barbara Huber is a 81 y.o. female with PMHx of COPD on 3L home [place], CHF, [name], [name], T2DM??who presents today for hospital follow-up. She presented to [facility]??with Acute on Chronic Decompensated Diastolic CHF in the setting of Septic Shock d/t Gangrenous Cholecystitis now s/p cholecystectomy 10/6. She presented with 1 week of dyspnea on exertion, leg swelling and abdominal distention without chest pain. Her dry weight was 232 pounds,??238 pounds. [facility], she was in afib with RVR with higher rates and softer pressures requiring NE and addition of vaso to  maintain MAP>65. She was given digoxin x 1 for better rate control with mild improvement in rates but was still requiring high doses of NE as well as vaso.??Dobutamine given to augment diuresis via bumex drip and metolazone x1 dose. Etiology of pressor requirement likely due to septic shock. Echo 10/4 showed upper-normal RV size with??reduced systolic function.??RHC to evaluate for RV dysfunction unable to be completed due to arterial placement of micropuncture sheath during procedure. She was weaned off of pressors and continued to improve with diuresis and treatment of underlying infection. At time of discharge, her acute Diastolic HF was controlled and she was restarted on home meds. For her afib, coreg was held during admission but then restarted prior to discharge.     She as seen in hospital follow-up 10/30/20. She was discharged on Lasix 40mg  BID. Her weight was 221lbs on discharge. Lasix was reduced to 20mg  daily by her PCP for low SBPs (although notes say 40mg  daily). Since reducing the Lasix, her weight crept up to 226lbs. Her belly felt  tight and she had LE edema.  At visit with PCP, her Coreg was also changed to metoprolol 25mg  and irbesartan was stopped. BPs were running 110s/70-90s. HR running around 100-110. Her metoprolol was increased to 50mg  for rate control. Lasix was increased to 40mg  daily.    She was seen 11/18 and her weight was been running 224-227lbs. She has low energy and since increasing metoprolol she notes worsening of her symptoms. She feels bloated and has LE edema. She is not back to her usual activities, like fixing her food or cooking. Her BP is running 100-120s/70-80s.     PYP scan showed TTR amyloid. She then saw Dr Luretha Murphy 12/3 and was feeling ok but somewhat volume up.   We met her on 1/7. Metoprolol was decreased to 50mg  daily and she was started on amiodarone. She unfortunately was hospitalized 1/11-1/15 for 3 days of worsening shortness of breath and fatigue.  She was found to have AKI and oliguria in the setting of increased beta blocker as an outpatient resulting in decreased cardiac filling/output. She was treated with gentle diuresis and removal of beta blockade with subsequent improvement in blood pressure and renal function. She underwent RHC with showed elevated filling pressures, normal CO, and restrictive filling pattern. She had a DCCV 1/14 with resolution of atrial fibrillation. She was continued on amiodarone.    As of 03/21/21, she has been feeling well. Her weight has been stable on 40 mg torsemide BID. She has not had any more UTIs and her DOE has been back to when she was feeling well. She has more energy and appetite is a bit better too. Her breathing and energy are good. Her HR has been very good and so has her BP.    Since her last visit in clinic 03/21/21,  she has had multiple admissions: 4/22 for weakness/urosepsis, 4/27 for shortness of breath/orthopnea/weight gain, 6/7 for lightheadedness/SOB/Blurry vision found to be anemic. She also had two recent [name] 6/17 for LE edema and dyspnea on exertion in the setting of recently being taken off her torsemide and spironolactone, and again 6/22 for anemia with Hgb down to 7.5. As of 6/24, she states her energy is a little better and she has more of an appetite. She has no swelling or SOB. She is sleeping fine at night without orthopnea/PND. She is taking torsemide daily still rather than every other day.     She was seen 9/2 for  follow-up. She had more swelling in her legs so torsemide was increased to 40mg  qAM, 20mg  qPM.     She was last seen in late November and was feeling well over all. She is not having a lot of issues with volume retention. Weight remains 216-218lbs. It was up to 221lbs today after receiving a COVID booster on Friday. She plans to take extra torsemide this afternoon. She has a bit of sock line edema. She hasnt taken metolazone in 2 weeks. She has good energy, is cooking for herself a bit. She is having bothersome jerking/tremors in her arms/hands. She is thinking it's caused by a medicine, wondering if it's the amiodarone.      She was admitted to [facility] 12/3-12/6 for [name] Exacerbation. She reports that her weight was around 218-219 though her understanding is that her dry weight should be around 214.Weight on admission 223lbs.??She tried taking a dose of metolazone earlier in the week and also increasing her afternoon torsemide from 20 to 40 with minimal response. She somewhat more short of breath than baseline, especially with exertion. LE edema was stable to her. On labs her BNP is up from baseline. Started on 40mg  IV Lasix BID and given  5mg  metolazone. Weight now trending down.  Creatinine increased to 3.15 from a baseline around 2.5 also with azotemia and mild hyperkalemia. Creatinine trended down with diuresis and was 2.47 on discharge. She was discharged on torsemide 40mg  qAM, 20mg  qPM. Unclear discharge weight, as 213 was documented on 12/5, 221 on 12/6, and 213 on 12/7.  She was then readmitted 12/10-12/18 for fatigue, new junctional rhythm, worsening AKI, troponin elevation, and possible volume overload.??During admission, she was at her dry weight of 213 pounds. ??Despite lack of overt volume overload signs and symptoms, given elevated BNP, subjective fatigue, and worsening AKI, she was diuresed while inpatient with Lasix 120 BID, in addition to metolazone 10 daily. TTE showed LVEF of >70%. LV with severely increased wall thickness, G3DD, RV with normal systolic function, elevated Right atrial pressure. Before discharge she was switched to PO diuresis and given the increasing Cr, was given a diuretic holiday for a day. Cr improved, so she was discharged on 40 torsemide BID and metolazone 5 PRN.     She was seen in hospital follow-up on 12/20.  Since discharge home, her weight went down to 202lbs and she felt dried out on torsemide 40mg  BID. Her Cr was still signficantly elevated from baseline at 4.66 and she appeared dry on exam so torsemide was held x2 days then restarted at 40mg  daily. On 1/03 she states she is feeling pretty well volume wise without LE edema or DOE. Her weight is staying around 202-204lbs. She did take an extra 20mg  torsemide about 2 days ago because weight went up to 206lbs, but has since come back down to 204lbs. PT is coming to the house.      She was last seen in clinic by Dr Wonda Cheng on 01/16/22 and was feeling well from a cardiac standpoint. Was very distraught as her younger sister recently disclosed her terminal lung cancer diagnosis and was beginning hospice care. For her anxiety she was started on Zoloft.     Interval history  Today she presents for follow up. Her sister passed away after only 4 days on hospice. That has been really hard for her and the family, as they didn't know she was sick until right before she died. She did start Zoloft 25mg  and it has helped with  anxitty. Her weight is up and down from 210-211lb. She is feeling pretty good there. She has taken a metolazone when weight was staying at 211lbs and she got worried it would go higher. She didn't feel bad, was just worried. She is taking an extra 20mg  of torsemide about every other day, especially if she eats something salty. She denies swelling, bloating, orthopnea, PND.    Cardiovascular History & Procedures:    Cath / PCI:  ?? none    CV Surgery:  ??  none    EP Procedures and Devices:  ?? none    Non-Invasive Evaluation(s):    Echo:  12/09/21  Summary    1. The left ventricle is normal in size with severely increased wall  thickness.    2. The left ventricular systolic function is hyperdynamic, LVEF is visually  estimated at >70%.    3. There is grade III diastolic dysfunction (severely elevated filling  pressure).    4. The left atrium is moderately to severely dilated in size.    5. The right ventricle is mildly dilated in size, with normal systolic  function.    6. The right atrium is mildly dilated  in size.    7. IVC size and inspiratory change suggest elevated right atrial pressure.  (10-20 mmHg).    04/24/21   1. The left ventricle is normal in size with mildly increased wall  thickness.    2. The left ventricular systolic function is normal, LVEF is visually  estimated at > 55%.    3. There is grade II diastolic dysfunction (elevated filling pressure).    4. Mitral annular calcification is present (mild).    5. The left atrium is mildly dilated in size.    6. The right ventricle is mildly dilated in size, with low normal systolic  function.    7. IVC size and inspiratory change suggest elevated right atrial pressure.  (10-20 mmHg).    ?? 09/30/20  Summary    1. Limited study to assess systolic function.    2. IVC size and inspiratory change suggest normal right atrial pressure.  (0-5 mmHg).    3. The left ventricle is normal in size with mildly to moderately increased  wall thickness.    4. The left ventricular systolic function is normal with no obvious wall  motion abnormalities, LVEF is visually estimated at > 55%.    5. Mitral annular calcification is present.    6. The left atrium is mildly dilated in size.    7. The right ventricle is upper normal in size, with reduced systolic  Function.    Cardiac CT/MRI/Nuclear Tests:  ??  None    6 Minute Walk:  ?? None    Cardiopulmonary Stress Tests:  ??  None               Other Past Medical History:  See below for the complete EPIC list of past medical and surgical history.      Allergies:  Nitrofurantoin and Lipitor [atorvastatin]    Current Medications:  Current Outpatient Medications   Medication Sig Dispense Refill   ??? ACCU-CHEK AVIVA PLUS TEST STRP Strp UESE TO CHECK BLOOD SUGAR 3 TIMES A DAY BEFORE MEALS 100 each 11   ??? acetaminophen (TYLENOL) 500 MG tablet Take 1,000 mg by mouth daily as needed for pain.      ??? ADVAIR HFA 115-21 mcg/actuation inhaler INHALE TWO PUFFS BY MOUTH TWICE A DAY 12 g 11   ???  albuterol HFA 90 mcg/actuation inhaler Inhale 2 puffs every eight (8) hours as needed for wheezing.     ??? amiodarone (PACERONE) 200 MG tablet Take 1 tablet (200 mg total) by mouth daily. 90 tablet 3   ??? apixaban (ELIQUIS) 2.5 mg Tab Take 1 tablet (2.5 mg total) by mouth Two (2) times a day. 60 tablet 11   ??? cranberry 500 mg cap Take 500 mg by mouth daily with evening meal.     ??? estradioL (ESTRACE) 0.01 % (0.1 mg/gram) vaginal cream INSERT ONE GRAM VAGINALLY AT BEDTIME     ??? fosfomycin (MONUROL) 3 gram Pack Take 3 g by mouth once a week. 36 g 0   ??? gabapentin (NEURONTIN) 100 MG capsule Take 2 capsules (200 mg total) by mouth daily as needed (pain). 270 capsule 3   ??? insulin glargine (BASAGLAR, LANTUS) 100 unit/mL (3 mL) injection pen Inject 0.04 mL (4 Units total) under the skin nightly. 15 mL 3   ??? lancets Misc 1 each by Miscellaneous route daily. Accu Check 100 each 6   ??? metOLazone (ZAROXOLYN) 5 MG tablet Take 1 tablet (5 mg total) by mouth daily as needed (when instructed by cardiology clinic). 30 tablet 1   ??? montelukast (SINGULAIR) 10 mg tablet Take 1 tablet (10 mg total) by mouth daily. 90 tablet 3   ??? NON FORMULARY APPLY FROM NECK DOWN TWICE A DAY     ??? ondansetron (ZOFRAN) 4 MG tablet Take 1 tablet (4 mg total) by mouth every eight (8) hours as needed. 60 tablet 2   ??? oxyCODONE (OXYCONTIN) 10 mg TR12 12 hr crush resistant ER/CR tablet Take 10 mg by mouth every twelve (12) hours.     ??? OXYGEN-AIR DELIVERY SYSTEMS MISC 5 L by Miscellaneous route. Currently using 3  L/min via [place]     ??? pantoprazole (PROTONIX) 20 MG tablet Take 1 tablet (20 mg total) by mouth in the morning. 60 tablet 5   ??? 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 rosuvastatin (CRESTOR) 5 MG tablet Take 1 tablet (5 mg total) by mouth every other day. 15 tablet 11   ??? SPIRIVA RESPIMAT 2.5 mcg/actuation inhalation mist INHALE TWO PUFFS BY MOUTH ONCE DAILY 4 g 11   ??? spironolactone (ALDACTONE) 25 MG tablet Take 25 mg by mouth in the morning.     ??? tafamidis (VYNDAMAX) 61 mg cap Take 1 capsule (61 mg) by mouth daily. 30 capsule 11   ??? varicella-zoster [id number]-[id number], PF, (SHINGRIX, PF,) 50 mcg/0.5 mL SusR injection Inject 0.5 mL into the muscle. 0.5 mL 1   ??? NARCAN 4 mg/actuation nasal spray 1 spray into alternating nostrils once as needed (opioid overdose). PRN - Emergency use. (Patient not taking: Reported on 02/13/2022)     ??? sertraline (ZOLOFT) 50 MG tablet Take 0.5 tablets (25 mg total) by mouth daily. 45 tablet 3   ??? torsemide (DEMADEX) 20 MG tablet Take 2 tablets (40 mg total) by mouth daily. May also take 1 tablet (20 mg total) daily as needed (weight gain for 3lbs overnight or 5lbs in a week). 180 tablet 3     No current facility-administered medications for this visit.       Family History:  The patient's family history includes Cancer in her father; Hypertension in her mother.    Social history:  She  reports that she quit smoking about 4 years ago. Her smoking use included cigarettes. She smoked an average of .33 packs per day. She has never used smokeless tobacco. She reports that she does not drink alcohol and does not use drugs.    Review of Systems:  As per HPI.  Rest of the review of ten systems is negative or unremarkable except as stated above.    Physical Exam:  VITAL SIGNS:   Vitals:    02/13/22 1137   BP: 108/44   Pulse: 61   SpO2: 94%      Wt Readings from Last 3 Encounters:   02/13/22 95.7 kg (211 lb)   01/22/22 94.8 kg (209 lb)   01/16/22 93.9 kg (207 lb)      Today's Body mass index is 34.06 kg/m??.      CONSTITUTIONAL: chronically ill-appearing in no acute distress  EYES: Conjunctivae and sclerae clear and anicteric.  ENT: Benign.   CARDIOVASCULAR: JVP not seen above the clavicle with HOB at 90 degrees. Rate and rhythm are regular.  There is no lifts or heaves.  Normal S1, S2. There is no murmur, gallops or rubs.  Radial and pedal pulses are 2+, bilaterally.   There is no pedal edema, bilaterally.   RESPIRATORY: Normal respiratory effort. Clear to auscultation bilaterally..  There are no wheezes.  GASTROINTESTINAL: Soft, non-tender, with audible bowel sounds. Abdomen nondistended.  Liver is nonpalpable.  SKIN: No rashes, ecchymosis or petechiae.  Warm, well perfused.   MUSCULOSKELETAL:  no joint swelling   NEURO/PSYCH: Appropriate mood and affect. Alert and oriented to person, place, and time. No gross motor or sensory deficits evident.    Pertinent Laboratory Studies:   Office Visit on 02/13/2022   Component Date Value Ref Range Status   ??? Sodium 02/13/2022 137  135 - 145 mmol/L Final   ??? Potassium 02/13/2022 4.4  3.4 - 4.8 mmol/L Final   ??? Chloride 02/13/2022 97 (L)  98 - 107 mmol/L Final   ??? CO2 02/13/2022 34.3 (H)  20.0 - 31.0 mmol/L Final   ??? Anion Gap 02/13/2022 6  5 - 14 mmol/L Final   ??? BUN 02/13/2022 66 (H)  9 - 23 mg/dL Final   ??? Creatinine 02/13/2022 3.20 (H)  0.60 - 0.80 mg/dL Final   ??? BUN/Creatinine Ratio 02/13/2022 21   Final   ??? eGFR [name]-EPI (2021) Female 02/13/2022 14 (L)  >=60 mL/min/1.71m2 Final   ??? Glucose 02/13/2022 142  70 - 179 mg/dL Final   ??? Calcium 16/09/9603 9.0  8.7 - 10.4 mg/dL Final   ??? BNP 54/08/8118 294.18 (H)  <=100 pg/mL Final   ??? Magnesium 02/13/2022 3.7 (H)  1.6 - 2.6 mg/dL Final   ??? WBC 14/78/2956 8.0  3.6 - 11.2 10*9/L Final   ??? RBC 02/13/2022 3.77 (L)  3.95 - 5.13 10*12/L Final   ??? HGB 02/13/2022 9.4 (L)  11.3 - 14.9 g/dL Final   ??? HCT 21/30/8657 30.2 (L)  34.0 - 44.0 % Final   ??? MCV 02/13/2022 80.2  77.6 - 95.7 fL Final   ??? MCH 02/13/2022 24.8 (L)  25.9 - 32.4 pg Final   ??? MCHC 02/13/2022 31.0 (L)  32.0 - 36.0 g/dL Final   ??? RDW 84/69/6295 19.3 (H)  12.2 - 15.2 % Final   ??? MPV 02/13/2022 7.9  6.8 - 10.7 fL Final   ??? Platelet 02/13/2022 275  150 - 450 10*9/L Final   Office Visit on 01/22/2022  Component Date Value Ref Range Status   ??? Spec Gravity/POC 01/22/2022 1.010  1.003 - 1.030 Final   ??? PH/POC 01/22/2022 5.5  5.0 - 9.0 Final   ??? Leuk Esterase/POC 01/22/2022 Negative  Negative Final   ??? Nitrite/POC 01/22/2022 Negative  Negative Final   ??? Protein/POC 01/22/2022 Negative  Negative Final   ??? UA Glucose/POC 01/22/2022 Negative  Negative Final   ??? Ketones, POC 01/22/2022 Negative  Negative Final   ??? Bilirubin/POC 01/22/2022 Negative  Negative Final   ??? Blood/POC 01/22/2022 Negative  Negative Final   ??? Urobilinogen/POC 01/22/2022 0.2  0.2 - 1.0 mg/dL Final   Office Visit on 12/30/2021   Component Date Value Ref Range Status   ??? Iron 12/30/2021 24 (L)  50 - 170 ug/dL Final   ??? TIBC 62/13/0865 337  250 - 425 ug/dL Final   ??? Iron Saturation (%) 12/30/2021 7 (L)  20 - 55 % Final   ??? BNP 12/30/2021 174.68 (H)  <=100 pg/mL Final   ??? Magnesium 12/30/2021 2.2  1.6 - 2.6 mg/dL Final   ??? Sodium 78/46/9629 139  135 - 145 mmol/L Final   ??? Potassium 12/30/2021 4.3  3.4 - 4.8 mmol/L Final   ??? Chloride 12/30/2021 102  98 - 107 mmol/L Final   ??? CO2 12/30/2021 26.1  20.0 - 31.0 mmol/L Final   ??? Anion Gap 12/30/2021 11  5 - 14 mmol/L Final   ??? BUN 12/30/2021 50 (H)  9 - 23 mg/dL Final   ??? Creatinine 12/30/2021 2.40 (H)  0.60 - 0.80 mg/dL Final   ??? BUN/Creatinine Ratio 12/30/2021 21   Final   ??? eGFR [name]-EPI (2021) Female 12/30/2021 20 (L)  >=60 mL/min/1.63m2 Final   ??? Glucose 12/30/2021 124  70 - 179 mg/dL Final   ??? Calcium 52/84/1324 9.7  8.7 - 10.4 mg/dL Final   ??? WBC 40/09/2724 7.9  3.6 - 11.2 10*9/L Final   ??? RBC 12/30/2021 4.29  3.95 - 5.13 10*12/L Final   ??? HGB 12/30/2021 10.6 (L)  11.3 - 14.9 g/dL Final   ??? HCT 36/64/4034 34.3  34.0 - 44.0 % Final   ??? MCV 12/30/2021 80.0  77.6 - 95.7 fL Final   ??? MCH 12/30/2021 24.8 (L)  25.9 - 32.4 pg Final   ??? MCHC 12/30/2021 31.0 (L)  32.0 - 36.0 g/dL Final   ??? RDW 74/25/9563 23.1 (H)  12.2 - 15.2 % Final   ??? MPV 12/30/2021 8.5  6.8 - 10.7 fL Final   ??? Platelet 12/30/2021 240  150 - 450 10*9/L Final   Appointment on 12/24/2021   Component Date Value Ref Range Status   ??? Sodium 12/24/2021 136  135 - 145 mmol/L Final   ??? Potassium 12/24/2021 4.1  3.4 - 4.8 mmol/L Final   ??? Chloride 12/24/2021 99  98 - 107 mmol/L Final   ??? CO2 12/24/2021 30.0  20.0 - 31.0 mmol/L Final   ??? Anion Gap 12/24/2021 7  5 - 14 mmol/L Final   ??? BUN 12/24/2021 67 (H)  9 - 23 mg/dL Final   ??? Creatinine 12/24/2021 2.83 (H)  0.60 - 0.80 mg/dL Final   ??? BUN/Creatinine Ratio 12/24/2021 24   Final   ??? eGFR [name]-EPI (2021) Female 12/24/2021 16 (L)  >=60 mL/min/1.70m2 Final   ??? Glucose 12/24/2021 190 (H)  70 - 179 mg/dL Final   ??? Calcium 87/56/4332 9.5  8.7 - 10.4 mg/dL Final   ??? Ferritin 95/18/8416 51.2  7.3 -  270.7 ng/mL Final   ??? WBC 12/24/2021 8.9  3.6 - 11.2 10*9/L Final   ??? RBC 12/24/2021 4.02  3.95 - 5.13 10*12/L Final   ??? HGB 12/24/2021 9.8 (L)  11.3 - 14.9 g/dL Final   ??? HCT 29/56/2130 31.8 (L)  34.0 - 44.0 % Final   ??? MCV 12/24/2021 79.1  77.6 - 95.7 fL Final   ??? MCH 12/24/2021 24.3 (L)  25.9 - 32.4 pg Final   ??? MCHC 12/24/2021 30.7 (L)  32.0 - 36.0 g/dL Final   ??? RDW 86/57/8469 22.7 (H)  12.2 - 15.2 % Final   ??? MPV 12/24/2021 8.7  6.8 - 10.7 fL Final   ??? Platelet 12/24/2021 325  150 - 450 10*9/L Final   ??? nRBC 12/24/2021 0  <=4 /100 WBCs Final   ??? Neutrophils % 12/24/2021 79.6  % Final   ??? Lymphocytes % 12/24/2021 9.3  % Final   ??? Monocytes % 12/24/2021 9.1  % Final   ??? Eosinophils % 12/24/2021 0.9  % Final   ??? Basophils % 12/24/2021 1.1  % Final   ??? Absolute Neutrophils 12/24/2021 7.1  1.8 - 7.8 10*9/L Final   ??? Absolute Lymphocytes 12/24/2021 0.8 (L)  1.1 - 3.6 10*9/L Final   ??? Absolute Monocytes 12/24/2021 0.8  0.3 - 0.8 10*9/L Final   ??? Absolute Eosinophils 12/24/2021 0.1  0.0 - 0.5 10*9/L Final   ??? Absolute Basophils 12/24/2021 0.1  0.0 - 0.1 10*9/L Final   ??? Anisocytosis 12/24/2021 Marked (A)  Not Present Final   ??? Smear Review Comments 12/24/2021    Final   Office Visit on 12/16/2021   Component Date Value Ref Range Status   ??? Sodium 12/16/2021 129 (L)  135 - 145 mmol/L Final   ??? Potassium 12/16/2021 3.8  3.4 - 4.8 mmol/L Final   ??? Chloride 12/16/2021 85 (L)  98 - 107 mmol/L Final   ??? CO2 12/16/2021 31.9 (H)  20.0 - 31.0 mmol/L Final   ??? Anion Gap 12/16/2021 12  5 - 14 mmol/L Final   ??? BUN 12/16/2021 92 (H)  9 - 23 mg/dL Final   ??? Creatinine 12/16/2021 4.66 (H)  0.60 - 0.80 mg/dL Final   ??? BUN/Creatinine Ratio 12/16/2021 20   Final   ??? eGFR [name]-EPI (2021) Female 12/16/2021 9 (L)  >=60 mL/min/1.38m2 Final   ??? Glucose 12/16/2021 212 (H)  70 - 179 mg/dL Final   ??? Calcium 62/95/2841 10.1  8.7 - 10.4 mg/dL Final   ??? BNP 12/16/2021 223.36 (H)  <=100 pg/mL Final   ??? Magnesium 12/16/2021 3.4 (H)  1.6 - 2.6 mg/dL Final   No results displayed because visit has over 200 results.      No results displayed because visit has over 200 results.      Office Visit on 11/14/2021   Component Date Value Ref Range Status   ??? Sodium 11/14/2021 138  135 - 145 mmol/L Final   ??? Potassium 11/14/2021 4.6  3.4 - 4.8 mmol/L Final   ??? Chloride 11/14/2021 97 (L)  98 - 107 mmol/L Final   ??? CO2 11/14/2021 33.2 (H)  20.0 - 31.0 mmol/L Final   ??? Anion Gap 11/14/2021 8  5 - 14 mmol/L Final   ??? BUN 11/14/2021 61 (H)  9 - 23 mg/dL Final   ??? Creatinine 11/14/2021 2.73 (H)  0.60 - 0.80 mg/dL Final   ??? BUN/Creatinine Ratio 11/14/2021 22   Final   ??? eGFR [name]-EPI (  2021) Female 11/14/2021 17 (L)  >=60 mL/min/1.26m2 Final   ??? Glucose 11/14/2021 214 (H)  70 - 179 mg/dL Final   ??? Calcium 16/09/9603 9.0  8.7 - 10.4 mg/dL Final   ??? BNP 11/14/2021 300.97 (H)  <=100 pg/mL Final   ??? Sodium 11/14/2021 138  135 - 145 mmol/L Final   ??? Potassium 11/14/2021 5.0 (H)  3.4 - 4.8 mmol/L Final   ??? Chloride 11/14/2021 96 (L)  98 - 107 mmol/L Final   ??? CO2 11/14/2021 33.2 (H)  20.0 - 31.0 mmol/L Final   ??? Anion Gap 11/14/2021 9  5 - 14 mmol/L Final   ??? BUN 11/14/2021 63 (H)  9 - 23 mg/dL Final   ??? Creatinine 11/14/2021 2.77 (H)  0.60 - 0.80 mg/dL Final   ??? BUN/Creatinine Ratio 11/14/2021 23   Final   ??? eGFR [name]-EPI (2021) Female 11/14/2021 17 (L)  >=60 mL/min/1.14m2 Final   ??? Glucose 11/14/2021 217 (H)  70 - 179 mg/dL Final   ??? Calcium 54/08/8118 9.1  8.7 - 10.4 mg/dL Final   ??? WBC 14/78/2956 8.0  3.6 - 11.2 10*9/L Final   ??? RBC 11/14/2021 3.87 (L)  3.95 - 5.13 10*12/L Final   ??? HGB 11/14/2021 9.6 (L)  11.3 - 14.9 g/dL Final   ??? HCT 21/30/8657 31.2 (L)  34.0 - 44.0 % Final   ??? MCV 11/14/2021 80.5  77.6 - 95.7 fL Final   ??? MCH 11/14/2021 24.9 (L)  25.9 - 32.4 pg Final   ??? MCHC 11/14/2021 30.9 (L)  32.0 - 36.0 g/dL Final   ??? RDW 84/69/6295 17.6 (H)  12.2 - 15.2 % Final   ??? MPV 11/14/2021 8.1  6.8 - 10.7 fL Final   ??? Platelet 11/14/2021 274  150 - 450 10*9/L Final   ??? nRBC 11/14/2021 0  <=4 /100 WBCs Final   ??? Neutrophils % 11/14/2021 77.2  % Final   ??? Lymphocytes % 11/14/2021 10.2  % Final   ??? Monocytes % 11/14/2021 8.6  % Final   ??? Eosinophils % 11/14/2021 2.5  % Final   ??? Basophils % 11/14/2021 1.5  % Final   ??? Absolute Neutrophils 11/14/2021 6.2  1.8 - 7.8 10*9/L Final   ??? Absolute Lymphocytes 11/14/2021 0.8 (L)  1.1 - 3.6 10*9/L Final   ??? Absolute Monocytes 11/14/2021 0.7  0.3 - 0.8 10*9/L Final   ??? Absolute Eosinophils 11/14/2021 0.2  0.0 - 0.5 10*9/L Final   ??? Absolute Basophils 11/14/2021 0.1  0.0 - 0.1 10*9/L Final   ??? Anisocytosis 11/14/2021 Slight (A)  Not Present Final   Office Visit on 10/23/2021   Component Date Value Ref Range Status   ??? Creat U 10/23/2021 44.6  Undefined mg/dL Final   ??? Protein, Ur 10/23/2021 <6.0  Undefined mg/dL Final   ??? Protein/Creatinine Ratio, Urine 10/23/2021    Final   ??? Creat U 10/23/2021 44.7  Undefined mg/dL Final   ??? Albumin Quantitative, Urine 10/23/2021 <0.3  Undefined mg/dL Final   ??? Albumin/Creatinine Ratio 10/23/2021    Final   ??? Spec Gravity/POC 10/23/2021 1.015  1.003 - 1.030 Final   ??? PH/POC 10/23/2021 6.5  5.0 - 9.0 Final   ??? Leuk Esterase/POC 10/23/2021 Negative  Negative Final   ??? Nitrite/POC 10/23/2021 Negative  Negative Final   ??? Protein/POC 10/23/2021 Negative  Negative Final   ??? UA Glucose/POC 10/23/2021 Negative  Negative Final   ??? Ketones, POC 10/23/2021 Negative  Negative Final   ???  Bilirubin/POC 10/23/2021 Negative  Negative Final   ??? Blood/POC 10/23/2021 Negative  Negative Final   ??? Urobilinogen/POC 10/23/2021 0.2  0.2 - 1.0 mg/dL Final       Lab Results   Component Value Date    PRO-BNP 3,420.0 (H) 07/03/2021    PRO-BNP 905.0 (H) 03/21/2021    PRO-BNP 3,796.0 (H) 12/23/2020    Creatinine 3.20 (H) 02/13/2022    Creatinine 2.40 (H) 12/30/2021    Creatinine 0.79 01/06/2011    BUN 66 (H) 02/13/2022    BUN 50 (H) 12/30/2021    BUN 20 01/06/2011    Sodium 137 02/13/2022    Sodium 140 01/06/2011    Potassium 4.4 02/13/2022    Potassium 4.1 01/06/2011    CO2 34.3 (H) 02/13/2022    CO2 30 01/06/2011    Magnesium 3.7 (H) 02/13/2022    Magnesium 2.0 01/06/2011    Total Bilirubin 0.5 12/09/2021    INR 2.15 07/01/2021    INR 1.0 01/06/2011       Lab Results   Component Value Date    Digoxin Level 0.9 10/01/2020       Lab Results   Component Value Date    TSH 2.731 12/06/2021    Cholesterol 95 12/06/2021    Triglycerides 66 12/06/2021    HDL 42 12/06/2021    Non-HDL Cholesterol 53 (L) 12/06/2021    LDL Calculated 40 12/06/2021       Lab Results   Component Value Date    WBC 8.0 02/13/2022    WBC 12.5 (H) 01/06/2011    HGB 9.4 (L) 02/13/2022    HGB 12.9 01/06/2011    HCT 30.2 (L) 02/13/2022    HCT 40.8 01/06/2011    Platelet 275 02/13/2022    Platelet 260 01/06/2011       Pertinent Test Results from Today:  None    Other pertinent records were reviewed.    The following are further history from the patient's EPIC record for reference:     Past Medical History:   Diagnosis Date   ??? Acute kidney injury superimposed on chronic kidney disease (CMS-HCC) 10/11/2020   ??? Acute on chronic diastolic (congestive) heart failure (CMS-HCC) 08/23/2020   ??? Arthritis    ??? Calculus of kidney    ??? Calculus of ureter    ??? CHF (congestive heart failure) (CMS-HCC)    ??? Chronic atrial fibrillation (CMS-HCC) 07/20/2019   ??? COPD (chronic obstructive pulmonary disease) (CMS-HCC)    ??? Diabetes (CMS-HCC)    ??? Gangrenous cholecystitis 10/11/2020   ??? GERD (gastroesophageal reflux disease)    ??? Hydronephrosis    ??? Hypertension    ??? Hyponatremia 10/11/2020   ??? Intermediate coronary syndrome (CMS-HCC) 03/13/2014   ??? Lower extremity edema 09/28/2020   ??? Lumbar stenosis    ??? Microscopic hematuria    ??? Nausea alone    ??? Nephrolithiasis 04/17/2016   ??? Neuropathy    ??? Nocturia    ??? Other chronic cystitis    ??? Pulmonary hypertension (CMS-HCC)    ??? Renal colic    ??? Sleep apnea    ??? Unstable angina pectoris (CMS-HCC) 03/13/2014       Past Surgical History:   Procedure Laterality Date   ??? BACK SURGERY  1995   ??? CARPAL TUNNEL RELEASE Left 2014   ??? HYSTERECTOMY  1971   ??? IR INSERT CHOLECYSTOSMY TUBE PERCUTANEOUS  10/02/2020    IR INSERT CHOLECYSTOSMY TUBE PERCUTANEOUS 10/02/2020 Braulio Conte, MD IMG  VIR H&V [facility]   ??? LUMBAR DISC SURGERY     ??? PR REMOVAL GALLBLADDER N/A 10/06/2020    Procedure: CHOLECYSTECTOMY;  Surgeon: Katherina Mires, MD;  Location: MAIN OR [facility];  Service: Trauma   ??? PR RIGHT HEART CATH O2 SATURATION & CARDIAC OUTPUT N/A 09/30/2020    Procedure: Right Heart Catheterization;  Surgeon: Neal Dy, MD;  Location: [facility] CATH;  Service: Cardiology   ??? PR RIGHT HEART CATH O2 SATURATION & CARDIAC OUTPUT N/A 01/09/2021    Procedure: Right Heart Catheterization;  Surgeon: Lesle Reek, MD;  Location: [facility] CATH;  Service: Cardiology

## 2022-02-16 DIAGNOSIS — I5032 Chronic diastolic (congestive) heart failure: Principal | ICD-10-CM

## 2022-02-16 DIAGNOSIS — D500 Iron deficiency anemia secondary to blood loss (chronic): Principal | ICD-10-CM

## 2022-02-19 ENCOUNTER — Ambulatory Visit: Admit: 2022-02-19 | Discharge: 2022-02-20 | Payer: MEDICARE

## 2022-02-19 DIAGNOSIS — N184 Chronic kidney disease, stage 4 (severe): Principal | ICD-10-CM

## 2022-02-19 DIAGNOSIS — I10 Essential (primary) hypertension: Principal | ICD-10-CM

## 2022-02-19 DIAGNOSIS — E1122 Type 2 diabetes mellitus with diabetic chronic kidney disease: Principal | ICD-10-CM

## 2022-02-19 DIAGNOSIS — N1832 Type 2 diabetes mellitus with stage 3b chronic kidney disease, without long-term current use of insulin (CMS-HCC): Principal | ICD-10-CM

## 2022-02-19 DIAGNOSIS — I5032 Chronic diastolic (congestive) heart failure: Principal | ICD-10-CM

## 2022-02-19 LAB — BASIC METABOLIC PANEL
ANION GAP: 9 mmol/L (ref 5–14)
BLOOD UREA NITROGEN: 54 mg/dL — ABNORMAL HIGH (ref 9–23)
BUN / CREAT RATIO: 20
CALCIUM: 8.9 mg/dL (ref 8.7–10.4)
CHLORIDE: 101 mmol/L (ref 98–107)
CO2: 31.5 mmol/L — ABNORMAL HIGH (ref 20.0–31.0)
CREATININE: 2.71 mg/dL — ABNORMAL HIGH
EGFR CKD-EPI (2021) FEMALE: 17 mL/min/{1.73_m2} — ABNORMAL LOW (ref >=60)
GLUCOSE RANDOM: 149 mg/dL (ref 70–179)
POTASSIUM: 4.4 mmol/L (ref 3.4–4.8)
SODIUM: 141 mmol/L (ref 135–145)

## 2022-02-19 MED ORDER — SERTRALINE 25 MG TABLET
ORAL_TABLET | Freq: Every day | ORAL | 3 refills | 90.00000 days | Status: CP
Start: 2022-02-19 — End: 2023-02-19

## 2022-02-19 NOTE — Unmapped (Signed)
[facility] at [facility]     Type of visit: face to face    Are you located in [place]? (for virtual visits only)     Reason for visit: Follow up    Questions / Concerns that need to be addressed: none    Screening BP- 102/48    66  PTHomeBP   o     HCDM reviewed and updated in Epic:    We are working to make sure all of our patients??? wishes are updated in Epic and part of that is documenting a Environmental health practitioner for each patient  A Health Care Decision Maker is someone you choose who can make health care decisions for you if you are not able - who would you most want to do this for you????  is already up to date.    HCDM (patient stated preference): Devota Pace - Daughter - [phone]    BPAs completed:      COVID-19 Vaccine Summary  Which COVID-19 Vaccine was administered  Pfizer  Type:  Dates Given:  11/14/2021     Date last COVID Positive Test:   06/03/2021               If no: Are you interested in scheduling?   Immunization History   Administered Date(s) Administered   ??? COVID-19 VAC,BIVALENT(68YR UP)BOOST,PFIZER 11/14/2021   ??? COVID-19 VACC,MRNA,(PFIZER)(PF) 01/19/2020, 02/09/2020, 12/12/2020   ??? INFLUENZA QUAD ADJUVANTED 23YR UP(FLUAD) 09/17/2021   ??? INFLUENZA QUAD HIGH DOSE 23YRS+(FLUZONE) 09/28/2016   ??? Influenza Vaccine Quad (IIV4 PF) 41mo+ injectable 10/16/2015, 09/08/2019, 08/30/2020   ??? Influenza Virus Vaccine, unspecified formulation 09/27/2014, 09/23/2017, 09/21/2018   ??? PNEUMOCOCCAL POLYSACCHARIDE 23 09/28/2016   ??? Pneumococcal Conjugate 13-Valent 08/22/2019   ??? SHINGRIX-ZOSTER VACCINE (HZV), RECOMBINANT,SUB-UNIT,ADJUVANTED IM 04/04/2021, 09/17/2021       __________________________________________________________________________________________    SCREENINGS COMPLETED IN FLOWSHEETS    HARK Screening       AUDIT       PHQ2       PHQ9          P4 Suicidality Screener                GAD7       COPD Assessment       Falls Risk

## 2022-02-19 NOTE — Unmapped (Signed)
Internal Medicine Clinic Visit    Reason for visit: Follow up diabetes, [name]    A/P:    Type 2 diabetes mellitus with stage 3b chronic kidney disease, without long-term current use of insulin (CMS-HCC)  Lab Results   Component Value Date    A1C 7.0 (H) 12/06/2021    A1C 5.9 08/27/2021    A1C <3.8 (L) 06/02/2021     Diabetes, on lantus 4 unit qhs at bedtime.   BS s at home in range, no hypoglycemia  Plan: cont. 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 Followed by Dr Emi Holes  Most recent Echo12/2022:   Summary    1. The left ventricle is normal in size with severely increased wall  thickness.    2. The left ventricular systolic function is hyperdynamic, LVEF is visually  estimated at >70%.    3. There is grade III diastolic dysfunction (severely elevated filling  pressure).    4. The left atrium is moderately to severely dilated in size.    5. The right ventricle is mildly dilated in size, with normal systolic  function.    6. The right atrium is mildly dilated  in size.    7. IVC size and inspiratory change suggest elevated right atrial pressure.  (10-20 mmHg).  On amiodarone 200 mg every day, torsemide 40 mg every day, tafamidis for TTR amyloid, spirinolactone 25 mg  Has iron infusion ordered  Slightly volume up today, based on pedal edema. Will wait to see creatinine.   Euvolemic today  Plan: Will check BMP today  Cont current meds  Await iron infusion  Follows up with [facility] cardiology     Chronic kidney disease ([name]), stage IV (severe) (CMS-HCC)  Overview:  Component Value Date    CREATININE 2.71 (H) 02/19/2022     Creatinine sl improved today.   Anticipate maintaining current dose of torsemide 40 mg every day.   Follows with [facility] nephrology    Essential hypertension  History of hypertension, now stable.   On spirinolactone 25 mg every day.Torsemide-variable dosing  BP: 102/48      Plan: continue treatment-    Health Maintenance  Health Maintenance Due   Topic Date Due   ??? Retinal Eye Exam  Never done   ??? DTaP/Tdap/Td Vaccines (1 - Tdap) Never done   ??? DEXA Scan-Start Age 37  Never done   ??? Foot Exam  02/04/2022        Return in about 2 months (around 04/19/2022) for recheck DM.      HPI:Pt is a 81 y.o. female with a history of COPD on 3L home [place], CHF, [name] secondary to amyloid, [name], T2DM.  Recent bump in creatinine, torsemide decreased  Getting short of breath every now and then on 3 liters.   Diabetes: on insulin, lantus 4 units. BS 130-117.   No low blood sugars. None less than 100.   Taking torsemide 40 mg once per day.   Leg swelling-does not have any.   Eating okay.   Spirits are okay.   Baby sister passed away, of lung cancer.   Lab Results   Component Value Date    A1C 7.0 (H) 12/06/2021         Lab Results   Component Value Date    CREATININE 2.71 (H) 02/19/2022     Lab Results   Component Value Date    K 4.4 02/19/2022  Problem List:  Patient Active Problem List   Diagnosis   ??? Spinal stenosis of lumbar region   ??? Thoracic or lumbosacral neuritis or radiculitis   ??? Lumbosacral spondylosis   ??? COPD (chronic obstructive pulmonary disease) (CMS-HCC)   ??? Sleep apnea in adult   ??? Essential hypertension   ??? AKI (acute kidney injury) (CMS-HCC)   ??? Type 2 diabetes mellitus with stage 3b chronic kidney disease, without long-term current use of insulin (CMS-HCC)   ??? Chronic cystitis   ??? Incomplete bladder emptying   ??? Nocturia   ??? Peripheral neuropathy   ??? Chronic respiratory failure with hypoxia (CMS-HCC)   ??? Anemia   ??? Persistent atrial fibrillation (CMS-HCC)   ??? Anemia in chronic kidney disease   ??? Chronic kidney disease ([name]), stage IV (severe) (CMS-HCC)   ??? Enrolled in chronic care management   ??? Pulmonary hypertension (CMS-HCC)   ??? Shortness of breath   ??? Acute on chronic diastolic congestive heart failure (CMS-HCC)   ??? Chronic prescription opiate use   ??? Dyspnea on exertion   ??? 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 Cardiac amyloidosis (CMS-HCC)   ??? Recurrent cold sores   ??? Persistent fatigue after COVID-19   ??? Dyspepsia   ??? Iron deficiency anemia due to chronic blood loss   ??? Generalized edema        Medications:  Reviewed in EPIC  Soc History and Family History reviewed in Epic    Physical Exam:   Vital Signs:  Vitals:    02/19/22 1324   BP: 102/48   BP Site: L Arm   BP Position: Sitting   Pulse: 66   Resp: 14   Temp: 36.9 ??C (98.4 ??F)   TempSrc: Oral   SpO2: 92%   Weight: 96.2 kg (212 lb)   Height: 167.6 cm (5' 5.98)     Wt Readings from Last 3 Encounters:   02/19/22 96.2 kg (212 lb)   02/13/22 95.7 kg (211 lb)   01/22/22 94.8 kg (209 lb)        Gen: Well appearing, NAD, on 3l oxygen  CV: RRR, no murmurs  Pulm: CTA bilaterally, no crackles or wheezes  Abd: Soft, NTND, normal BS. No HSM.  Ext:1+ edema    Records review  Lab Results   Component Value Date    CREATININE 2.71 (H) 02/19/2022    CHOL 95 12/06/2021    HDL 42 12/06/2021    LDL 40 12/06/2021    NONHDL 53 (L) 12/06/2021    TRIG 66 12/06/2021    A1C 7.0 (H) 12/06/2021         The ASCVD Risk score (Arnett DK, et al., 2019) failed to calculate.     Medication adherence and barriers to the treatment plan have been addressed. Opportunities to optimize healthy behaviors have been discussed. Patient / caregiver voiced understanding.    I personally spent 30 minutes face-to-face and non-face-to-face in the care of this patient, which includes all pre, intra, and post visit time on the date of service.

## 2022-02-26 NOTE — Unmapped (Signed)
[facility] Refill Coordination Note    Specialty Medication(s) to be Shipped:   General Specialty: Vyndamax    Other medication(s) to be shipped: No additional medications requested for fill at this time     Barbara Huber, DOB: Feb 16, 1941  Phone: [phone] (home)       All above HIPAA information was verified with patient.     Was a Nurse, learning disability used for this call? No    Completed refill call assessment today to schedule patient's medication shipment from the [facility] [phone]).  All relevant notes have been reviewed.     Specialty medication(s) and dose(s) confirmed: Regimen is correct and unchanged.   Changes to medications: Maryon reports no changes at this time.  Changes to insurance: No  New side effects reported not previously addressed with a pharmacist or physician: None reported  Questions for the pharmacist: No    Confirmed patient received a Conservation officer, historic buildings and a Surveyor, mining with first shipment. The patient will receive a drug information handout for each medication shipped and additional FDA Medication Guides as required.       DISEASE/MEDICATION-SPECIFIC INFORMATION        N/A    SPECIALTY MEDICATION ADHERENCE     Medication Adherence    Patient reported X missed doses in the last month: 0  Specialty Medication: Vyndamax 61 mg  Patient is on additional specialty medications: No  Informant: patient              Were doses missed due to medication being on hold? No    Vyndamax 61 mg: 24 days of medicine on hand       REFERRAL TO PHARMACIST     Referral to the pharmacist: Not needed      [facility]     Shipping address confirmed in Epic.     Delivery Scheduled: Patient declined refill at this time due to Having 24 days.     Medication will be delivered via Next Day Courier to the prescription address in Epic [place].    Barbara Huber M Barbara Huber   [facility] Specialty Technician

## 2022-03-01 DIAGNOSIS — I5032 Chronic diastolic (congestive) heart failure: Principal | ICD-10-CM

## 2022-03-01 DIAGNOSIS — D500 Iron deficiency anemia secondary to blood loss (chronic): Principal | ICD-10-CM

## 2022-03-10 DIAGNOSIS — E854 Organ-limited amyloidosis: Principal | ICD-10-CM

## 2022-03-10 DIAGNOSIS — I43 Cardiomyopathy in diseases classified elsewhere: Principal | ICD-10-CM

## 2022-03-10 DIAGNOSIS — D500 Iron deficiency anemia secondary to blood loss (chronic): Principal | ICD-10-CM

## 2022-03-10 DIAGNOSIS — I5032 Chronic diastolic (congestive) heart failure: Principal | ICD-10-CM

## 2022-03-10 NOTE — Unmapped (Signed)
Addended by: Hope Budds on: 03/10/2022 02:52 PM     Modules accepted: Orders

## 2022-03-12 NOTE — Unmapped (Signed)
[facility] Refill Coordination Note    Specialty Medication(s) to be Shipped:   General Specialty: Vyndamax    Other medication(s) to be shipped: No additional medications requested for fill at this time     Barbara Huber, DOB: 02-May-1941  Phone: [phone] (home)       All above HIPAA information was verified with patient.     Was a Nurse, learning disability used for this call? No    Completed refill call assessment today to schedule patient's medication shipment from the [facility] [phone]).  All relevant notes have been reviewed.     Specialty medication(s) and dose(s) confirmed: Regimen is correct and unchanged.   Changes to medications: Cayce reports no changes at this time.  Changes to insurance: No  New side effects reported not previously addressed with a pharmacist or physician: None reported  Questions for the pharmacist: No    Confirmed patient received a Conservation officer, historic buildings and a Surveyor, mining with first shipment. The patient will receive a drug information handout for each medication shipped and additional FDA Medication Guides as required.       DISEASE/MEDICATION-SPECIFIC INFORMATION        N/A    SPECIALTY MEDICATION ADHERENCE     Medication Adherence    Patient reported X missed doses in the last month: 0  Specialty Medication: Vyndamax 61mg   Patient is on additional specialty medications: No  Patient is on more than two specialty medications: No              Were doses missed due to medication being on hold? No    Vyndamax 61 mg: 9 days of medicine on hand       REFERRAL TO PHARMACIST     Referral to the pharmacist: Not needed      [facility]     Shipping address confirmed in Epic.     Delivery Scheduled: Yes, Expected medication delivery date: 03/17/22.     Medication will be delivered via Next Day Courier to the prescription address in Epic [place].    Nancy Nordmann [facility] Specialty Technician

## 2022-03-13 ENCOUNTER — Ambulatory Visit: Admit: 2022-03-13 | Discharge: 2022-03-14 | Payer: MEDICARE | Attending: Adult Health | Primary: Adult Health

## 2022-03-13 DIAGNOSIS — E854 Organ-limited amyloidosis: Principal | ICD-10-CM

## 2022-03-13 DIAGNOSIS — I4819 Other persistent atrial fibrillation: Principal | ICD-10-CM

## 2022-03-13 DIAGNOSIS — I10 Essential (primary) hypertension: Principal | ICD-10-CM

## 2022-03-13 DIAGNOSIS — I43 Cardiomyopathy in diseases classified elsewhere: Principal | ICD-10-CM

## 2022-03-13 DIAGNOSIS — I5033 Acute on chronic diastolic (congestive) heart failure: Principal | ICD-10-CM

## 2022-03-13 DIAGNOSIS — J42 Unspecified chronic bronchitis: Principal | ICD-10-CM

## 2022-03-13 DIAGNOSIS — R944 Abnormal results of kidney function studies: Principal | ICD-10-CM

## 2022-03-13 LAB — B-TYPE NATRIURETIC PEPTIDE: B-TYPE NATRIURETIC PEPTIDE: 268.76 pg/mL — ABNORMAL HIGH (ref <=100)

## 2022-03-13 LAB — CBC
HEMATOCRIT: 27.5 % — ABNORMAL LOW (ref 34.0–44.0)
HEMOGLOBIN: 8.3 g/dL — ABNORMAL LOW (ref 11.3–14.9)
MEAN CORPUSCULAR HEMOGLOBIN CONC: 30.2 g/dL — ABNORMAL LOW (ref 32.0–36.0)
MEAN CORPUSCULAR HEMOGLOBIN: 22.9 pg — ABNORMAL LOW (ref 25.9–32.4)
MEAN CORPUSCULAR VOLUME: 75.8 fL — ABNORMAL LOW (ref 77.6–95.7)
MEAN PLATELET VOLUME: 8.1 fL (ref 6.8–10.7)
PLATELET COUNT: 266 10*9/L (ref 150–450)
RED BLOOD CELL COUNT: 3.63 10*12/L — ABNORMAL LOW (ref 3.95–5.13)
RED CELL DISTRIBUTION WIDTH: 18 % — ABNORMAL HIGH (ref 12.2–15.2)
WBC ADJUSTED: 9 10*9/L (ref 3.6–11.2)

## 2022-03-13 LAB — BASIC METABOLIC PANEL
ANION GAP: 9 mmol/L (ref 5–14)
BLOOD UREA NITROGEN: 59 mg/dL — ABNORMAL HIGH (ref 9–23)
BUN / CREAT RATIO: 25
CALCIUM: 9.1 mg/dL (ref 8.7–10.4)
CHLORIDE: 103 mmol/L (ref 98–107)
CO2: 27.4 mmol/L (ref 20.0–31.0)
CREATININE: 2.33 mg/dL — ABNORMAL HIGH
EGFR CKD-EPI (2021) FEMALE: 21 mL/min/{1.73_m2} — ABNORMAL LOW (ref >=60)
GLUCOSE RANDOM: 188 mg/dL — ABNORMAL HIGH (ref 70–179)
POTASSIUM: 4.5 mmol/L (ref 3.4–4.8)
SODIUM: 139 mmol/L (ref 135–145)

## 2022-03-13 LAB — MAGNESIUM: MAGNESIUM: 3 mg/dL — ABNORMAL HIGH (ref 1.6–2.6)

## 2022-03-13 NOTE — Unmapped (Signed)
Today,    MEDICATIONS:  NO medication changes today.  Call if you have questions about your medications.    LABS:  We will call you if your labs need attention.    NEXT APPOINTMENT:  Call us when you decide about the cardioMEMS!  Return to clinic in 2 months with me      In general, to take care of your heart failure:  -Limit your fluid intake to 2 Liters (half-gallon) per day.    -Limit your salt intake to ideally 2-3 grams (2000-3000 mg) per day.  -Weigh yourself daily and record, and bring that weight diary to your next appointment.  (Weight gain of 2-3 pounds in 1 day typically means fluid weight.)    The medications for your heart are to help your heart and help you live longer.    Please contact us before stopping any of your heart medications.    Call the clinic at [phone] with questions.  Our clinic fax number is [phone].  If you need to reschedule future appointments, please call [phone] or [phone]  My office number (c/o De Burrs RN) is [phone] if you need further assistance.  After office hours, if you have urgent questions/problems, contact the on-call cardiologist through the hospital operator: [phone].    Please do not send a MyChart message for potentially life-threatening symptoms.  Please call 911 for a true medical emergency.    To learn more about heart failure, please read [place]'s Learning to Live with Heart Failure:  [email or web address].    Available online at   [email or web address] - then click the link Learning to Live with Heart Failure patient booklet  OR  [email or web address] - open the window for Medical Management and click the link Living with Heart Failure

## 2022-03-13 NOTE — Unmapped (Addendum)
[facility] Note    Referring Provider: Artelia Laroche, MD  [phone] [facility] [address] [place],  [place] [address]   Primary Provider: Jacquiline Doe, MD  [address]  [place] [address]   Other Providers:  Dr Luretha Murphy    Reason for Visit:  Barbara Huber is a 81 y.o. female being seen for routine visit and continued care of cardiac amyloidosis.    Assessment & Plan:  1. 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   - Moderately increased wall thickness with low voltage ECG - she's had issues with hypotension on ARB and BB, as well as atrial arrhythmias. SPEP/UPEP/free light chains without concern. 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 She has one daughter who accompanies her and is informed.   - DC'd Jardiance 10mg  daily due to recurrent UTIs  - She appears euvolemic on exam today on torsemide 40mg  daily with an extra 20mg  PRN for weight gain of 3lbs overnight or 5lbs in a week.   - Continue Tafamidis 61 mg daily   - Continue spironolactone 25 mg daily   - She is still consider CardioMEMS - would be a great candidate and we again discussed this today  1b. AKI  - Cr improved today to 2.33, K 4.5    2. Persistent atrial fibrillation  - CHA2DS2-VASc score = 5 (1-gender, 2-Age, 1-DM, 1-HTN)  - On Eliquis 2.5mg  BID (reduced dose for age, renal function) - Hgb 9.4, now trending down to 8.3. Will stop all together, shared decision making w/ pt and daughter. Could consider Watchman, although she is not interested in any procedures at this time.  - She had been in afib since at least 07/2020; now s/p cardioversion 01/10/21 and continues to be in NSR since  - On amiodarone 200mg  daily - increased it back herself after seeing no improvement in tremors/jerking with decreased dose    3. COPD  - home O2 3L [place] - followed by pulm  - on Advair and Spiriva    4. Iron deficiency anemia  - s/p 5 IV iron sessions   - Hgb low during admission (7.3 on 12/13). Improved to 9.3 on discharge, 8.3 today. Will discuss further eval w/ her PCP  - Received IV iron on 12/13.   - Recheck iron panel/ferritin last visit - iron infusion ordered and approved, should be scheduled    5. Anxiety  - Tolerating Zoloft 25mg  with improved moved/anxiety    Follow-up:  Return in about 2 months (around 05/13/2022) for Return HF.     History of Present Illness:  Barbara Huber is a 81 y.o. female with PMHx of COPD on 3L home [place], CHF, [name], [name], T2DM??who presents today for hospital follow-up. She [facility]??with Acute on Chronic Decompensated Diastolic CHF in the setting of Septic Shock d/t Gangrenous Cholecystitis now s/p cholecystectomy 10/6. She presented with 1 week of dyspnea on exertion, leg swelling and abdominal distention without chest pain. Her dry weight was 232 pounds,??238 pounds. Upon arrival to [facility], she was in afib with RVR with higher rates and softer pressures requiring NE and addition of vaso to maintain MAP>65. She was given digoxin x 1 for better rate control with mild improvement in rates but was still  requiring high doses of NE as well as vaso.??Dobutamine given to augment diuresis via bumex drip and metolazone x1 dose. Etiology of pressor requirement likely due to septic shock. Echo 10/4 showed upper-normal RV size with??reduced systolic function.??RHC to evaluate for RV dysfunction unable to be completed due to arterial placement of micropuncture sheath during procedure. She was weaned off of pressors and continued to improve with diuresis and treatment of underlying infection. At time of discharge, her acute Diastolic HF was controlled and she was restarted on home meds. For her afib, coreg was held during admission but then restarted prior to discharge.     She as seen in hospital follow-up 10/30/20. She was discharged on Lasix 40mg  BID. Her weight was 221lbs on discharge. Lasix was reduced to 20mg  daily by her PCP for low SBPs (although notes say 40mg  daily). Since reducing the Lasix, her weight crept up to 226lbs. Her belly felt  tight and she had LE edema.  At visit with PCP, her Coreg was also changed to metoprolol 25mg  and irbesartan was stopped. BPs were running 110s/70-90s. HR running around 100-110. Her metoprolol was increased to 50mg  for rate control. Lasix was increased to 40mg  daily.    She was seen 11/18 and her weight was been running 224-227lbs. She has low energy and since increasing metoprolol she notes worsening of her symptoms. She feels bloated and has LE edema. She is not back to her usual activities, like fixing her food or cooking. Her BP is running 100-120s/70-80s.     PYP scan showed TTR amyloid. She then saw Dr Luretha Murphy 12/3 and was feeling ok but somewhat volume up.   We met her on 1/7. Metoprolol was decreased to 50mg  daily and she was started on amiodarone. She unfortunately was hospitalized 1/11-1/15 for 3 days of worsening shortness of breath and fatigue.  She was found to have AKI and oliguria in the setting of increased beta blocker as an outpatient resulting in decreased cardiac filling/output. She was treated with gentle diuresis and removal of beta blockade with subsequent improvement in blood pressure and renal function. She underwent RHC with showed elevated filling pressures, normal CO, and restrictive filling pattern. She had a DCCV 1/14 with resolution of atrial fibrillation. She was continued on amiodarone.    As of 03/21/21, she has been feeling well. Her weight has been stable on 40 mg torsemide BID. She has not had any more UTIs and her DOE has been back to when she was feeling well. She has more energy and appetite is a bit better too. Her breathing and energy are good. Her HR has been very good and so has her BP.    Since her last visit in clinic 03/21/21,  she has had multiple admissions: 4/22 for weakness/urosepsis, 4/27 for shortness of breath/orthopnea/weight gain, 6/7 for lightheadedness/SOB/Blurry vision found to be anemic. She also had two recent [name] 6/17 for LE edema and dyspnea on exertion in the setting of recently being taken off her torsemide and spironolactone, and again 6/22 for anemia with Hgb down to 7.5. As of 6/24, she states her energy is a little better and she has more of an appetite. She has no swelling or SOB. She is sleeping fine at night without orthopnea/PND. She is taking torsemide daily still rather than every other day.     She was seen 9/2 for follow-up. She had more swelling in her legs so torsemide was increased to 40mg  qAM, 20mg  qPM.  She was last seen in late November and was feeling well over all. She is not having a lot of issues with volume retention. Weight remains 216-218lbs. It was up to 221lbs today after receiving a COVID booster on Friday. She plans to take extra torsemide this afternoon. She has a bit of sock line edema. She hasnt taken metolazone in 2 weeks. She has good energy, is cooking for herself a bit. She is having bothersome jerking/tremors in her arms/hands. She is thinking it's caused by a medicine, wondering if it's the amiodarone.      She was admitted to [facility] 12/3-12/6 for [name] Exacerbation. She reports that her weight was around 218-219 though her understanding is that her dry weight should be around 214.Weight on admission 223lbs.??She tried taking a dose of metolazone earlier in the week and also increasing her afternoon torsemide from 20 to 40 with minimal response. She somewhat more short of breath than baseline, especially with exertion. LE edema was stable to her. On labs her BNP is up from baseline. Started on 40mg  IV Lasix BID and given  5mg  metolazone. Weight now trending down.  Creatinine increased to 3.15 from a baseline around 2.5 also with azotemia and mild hyperkalemia. Creatinine trended down with diuresis and was 2.47 on discharge. She was discharged on torsemide 40mg  qAM, 20mg  qPM. Unclear discharge weight, as 213 was documented on 12/5, 221 on 12/6, and 213 on 12/7.  She was then readmitted 12/10-12/18 for fatigue, new junctional rhythm, worsening AKI, troponin elevation, and possible volume overload.??During admission, she was at her dry weight of 213 pounds. ??Despite lack of overt volume overload signs and symptoms, given elevated BNP, subjective fatigue, and worsening AKI, she was diuresed while inpatient with Lasix 120 BID, in addition to metolazone 10 daily. TTE showed LVEF of >70%. LV with severely increased wall thickness, G3DD, RV with normal systolic function, elevated Right atrial pressure. Before discharge she was switched to PO diuresis and given the increasing Cr, was given a diuretic holiday for a day. Cr improved, so she was discharged on 40 torsemide BID and metolazone 5 PRN.     She was seen in hospital follow-up on 12/20.  Since discharge home, her weight went down to 202lbs and she felt dried out on torsemide 40mg  BID. Her Cr was still signficantly elevated from baseline at 4.66 and she appeared dry on exam so torsemide was held x2 days then restarted at 40mg  daily. On 1/03 she states she is feeling pretty well volume wise without LE edema or DOE. Her weight is staying around 202-204lbs. She did take an extra 20mg  torsemide about 2 days ago because weight went up to 206lbs, but has since come back down to 204lbs. PT is coming to the house.      She was seen in clinic by Dr Wonda Cheng on 01/16/22 and was feeling well from a cardiac standpoint. Was very distraught as her younger sister recently disclosed her terminal lung cancer diagnosis and was beginning hospice care. For her anxiety she was started on Zoloft.     She was last seen 2/17. Weight 210-211. Feeling well. Taking extra torsemide 20mg  about every other day, which was stopped for elevated Cr.     Interval history  Today she presents for follow up. Since her last visit, she has been doing well fluid wise, up and down. Weight had crept up to 216 from 210 to 216, took a metolazone 3 weeks ago on a Saturday and the next day  she was already down 3lbs. Then weight kept going down to 205. She started back her regular dosage of the torsemide, didn't take any more metolazone. She was 210lb this morning. She has no LE edema. Breathing is at baseline. However she does complain of feeling tired. Her get up and go is gone.  She is still thinking about the CardioMEMS.        Cardiovascular History & Procedures:    Cath / PCI:  ?? none    CV Surgery:  ??  none    EP Procedures and Devices:  ?? none    Non-Invasive Evaluation(s):    Echo:  12/09/21  Summary    1. The left ventricle is normal in size with severely increased wall  thickness.    2. The left ventricular systolic function is hyperdynamic, LVEF is visually  estimated at >70%.    3. There is grade III diastolic dysfunction (severely elevated filling  pressure).    4. The left atrium is moderately to severely dilated in size.    5. The right ventricle is mildly dilated in size, with normal systolic  function.    6. The right atrium is mildly dilated  in size.    7. IVC size and inspiratory change suggest elevated right atrial pressure.  (10-20 mmHg).    04/24/21   1. The left ventricle is normal in size with mildly increased wall  thickness.    2. The left ventricular systolic function is normal, LVEF is visually  estimated at > 55%.    3. There is grade II diastolic dysfunction (elevated filling pressure).    4. Mitral annular calcification is present (mild).    5. The left atrium is mildly dilated in size.    6. The right ventricle is mildly dilated in size, with low normal systolic  function.    7. IVC size and inspiratory change suggest elevated right atrial pressure.  (10-20 mmHg).    ?? 09/30/20  Summary    1. Limited study to assess systolic function.    2. IVC size and inspiratory change suggest normal right atrial pressure.  (0-5 mmHg).    3. The left ventricle is normal in size with mildly to moderately increased  wall thickness.    4. The left ventricular systolic function is normal with no obvious wall  motion abnormalities, LVEF is visually estimated at > 55%.    5. Mitral annular calcification is present.    6. The left atrium is mildly dilated in size.    7. The right ventricle is upper normal in size, with reduced systolic  Function.    Cardiac CT/MRI/Nuclear Tests:  ??  None    6 Minute Walk:  ?? None    Cardiopulmonary Stress Tests:  ??  None               Other Past Medical History:  See below for the complete EPIC list of past medical and surgical history.      Allergies:  Nitrofurantoin and Lipitor [atorvastatin]    Current Medications:  Current Outpatient Medications   Medication Sig Dispense Refill   ??? ACCU-CHEK AVIVA PLUS TEST STRP Strp UESE TO CHECK BLOOD SUGAR 3 TIMES A DAY BEFORE MEALS 100 each 11   ??? acetaminophen (TYLENOL) 500 MG tablet Take 2 tablets (1,000 mg total) by mouth daily as needed for pain.     ??? ADVAIR HFA 115-21 mcg/actuation inhaler INHALE TWO PUFFS BY MOUTH TWICE A DAY 12  g 11   ??? albuterol HFA 90 mcg/actuation inhaler Inhale 2 puffs every eight (8) hours as needed for wheezing.     ??? amiodarone (PACERONE) 200 MG tablet Take 1 tablet (200 mg total) by mouth daily. 90 tablet 3   ??? apixaban (ELIQUIS) 2.5 mg Tab Take 1 tablet (2.5 mg total) by mouth Two (2) times a day. 60 tablet 11   ??? cranberry 500 mg cap Take 500 mg by mouth daily with evening meal.     ??? estradioL (ESTRACE) 0.01 % (0.1 mg/gram) vaginal cream INSERT ONE GRAM VAGINALLY AT BEDTIME     ??? fosfomycin (MONUROL) 3 gram Pack Take 3 g by mouth once a week. 36 g 0   ??? gabapentin (NEURONTIN) 100 MG capsule Take 2 capsules (200 mg total) by mouth daily as needed (pain). 270 capsule 3   ??? insulin glargine (BASAGLAR, LANTUS) 100 unit/mL (3 mL) injection pen Inject 0.04 mL (4 Units total) under the skin nightly. 15 mL 3   ??? lancets Misc 1 each by Miscellaneous route daily. Accu Check 100 each 6   ??? montelukast (SINGULAIR) 10 mg tablet Take 1 tablet (10 mg total) by mouth daily. 90 tablet 3   ??? NARCAN 4 mg/actuation nasal spray 1 spray into alternating nostrils once as needed (opioid overdose). PRN - Emergency use.     ??? NON FORMULARY APPLY FROM NECK DOWN TWICE A DAY     ??? oxyCODONE myristate 9 mg CSpT Take 9 mg by mouth two (2) times a day.     ??? OXYGEN-AIR DELIVERY SYSTEMS MISC 5 L by Miscellaneous route. Currently using 3  L/min via      ??? pantoprazole (PROTONIX) 20 MG tablet Take 1 tablet (20 mg total) by mouth in the morning. 60 tablet 5   ??? 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 rosuvastatin (CRESTOR) 5 MG tablet Take 1 tablet (5 mg total) by mouth every other day. 15 tablet 11   ??? sertraline (ZOLOFT) 25 MG tablet Take 1 tablet (25 mg total) by mouth daily. 90 tablet 3   ??? SPIRIVA RESPIMAT 2.5 mcg/actuation inhalation mist INHALE TWO PUFFS BY MOUTH ONCE DAILY 4 g 11   ??? spironolactone (ALDACTONE) 25 MG tablet Take 1 tablet (25 mg total) by mouth daily.     ??? tafamidis (VYNDAMAX) 61 mg cap Take 1 capsule (61 mg) by mouth daily. 30 capsule 11   ??? torsemide (DEMADEX) 20 MG tablet Take 2 tablets (40 mg total) by mouth daily. May also take 1 tablet (20 mg total) daily as needed (weight gain for 3lbs overnight or 5lbs in a week). 180 tablet 3   ??? varicella-zoster [id number]-[id number], PF, (SHINGRIX, PF,) 50 mcg/0.5 mL SusR injection Inject 0.5 mL into the muscle. 0.5 mL 1   ??? metOLazone (ZAROXOLYN) 5 MG tablet Take 1 tablet (5 mg total) by mouth daily as needed (when instructed by cardiology clinic). 30 tablet 1     No current facility-administered medications for this visit.       Family History:  The patient's family history includes Cancer in her father; Hypertension in her mother.    Social history:  She  reports that she quit smoking about 4 years ago. Her smoking use included cigarettes. She smoked an average of .33 packs per day. She has never used smokeless tobacco.  She reports that she does not drink alcohol and does not use drugs.    Review of Systems:  As per HPI.  Rest of the review of ten systems is negative or unremarkable except as stated above.    Physical Exam:  VITAL SIGNS:   Vitals:    03/13/22 1152   BP: 111/47   Pulse: 65   SpO2: 93%      Wt Readings from Last 3 Encounters:   03/13/22 95.3 kg (210 lb)   02/19/22 96.2 kg (212 lb)   02/13/22 95.7 kg (211 lb)      Today's Body mass index is 34.95 kg/m??.   Height: 165.1 cm (5' 5)  CONSTITUTIONAL: chronically ill-appearing in no acute distress  EYES: Conjunctivae and sclerae clear and anicteric.  ENT: Benign.   CARDIOVASCULAR: JVP not seen above the clavicle with HOB at 90 degrees. Rate and rhythm are regular.  There is no lifts or heaves.  Normal S1, S2. There is no murmur, gallops or rubs.  Radial and pedal pulses are 2+, bilaterally.   There is no pedal edema, bilaterally.   RESPIRATORY: Normal respiratory effort. Clear to auscultation bilaterally..  There are no wheezes.  GASTROINTESTINAL: Soft, non-tender, with audible bowel sounds. Abdomen nondistended.  Liver is nonpalpable.  SKIN: No rashes, ecchymosis or petechiae.  Warm, well perfused.   MUSCULOSKELETAL:  no joint swelling   NEURO/PSYCH: Appropriate mood and affect. Alert and oriented to person, place, and time. No gross motor or sensory deficits evident.    Pertinent Laboratory Studies:   Office Visit on 03/13/2022   Component Date Value Ref Range Status   ??? BNP 03/13/2022 268.76 (H)  <=100 pg/mL Final   ??? Sodium 03/13/2022 139  135 - 145 mmol/L Final   ??? Potassium 03/13/2022 4.5  3.4 - 4.8 mmol/L Final   ??? Chloride 03/13/2022 103  98 - 107 mmol/L Final   ??? CO2 03/13/2022 27.4  20.0 - 31.0 mmol/L Final   ??? Anion Gap 03/13/2022 9  5 - 14 mmol/L Final   ??? BUN 03/13/2022 59 (H)  9 - 23 mg/dL Final   ??? Creatinine 03/13/2022 2.33 (H)  0.60 - 0.80 mg/dL Final   ??? BUN/Creatinine Ratio 03/13/2022 25   Final   ??? eGFR [name]-EPI (2021) Female 03/13/2022 21 (L)  >=60 mL/min/1.67m2 Final   ??? Glucose 03/13/2022 188 (H)  70 - 179 mg/dL Final   ??? Calcium 45/40/9811 9.1  8.7 - 10.4 mg/dL Final   ??? Magnesium 91/47/8295 3.0 (H)  1.6 - 2.6 mg/dL Final   ??? WBC 62/13/0865 9.0  3.6 - 11.2 10*9/L Final   ??? RBC 03/13/2022 3.63 (L)  3.95 - 5.13 10*12/L Final   ??? HGB 03/13/2022 8.3 (L)  11.3 - 14.9 g/dL Final   ??? HCT 78/46/9629 27.5 (L)  34.0 - 44.0 % Final   ??? MCV 03/13/2022 75.8 (L)  77.6 - 95.7 fL Final   ??? MCH 03/13/2022 22.9 (L)  25.9 - 32.4 pg Final   ??? MCHC 03/13/2022 30.2 (L)  32.0 - 36.0 g/dL Final   ??? RDW 52/84/1324 18.0 (H)  12.2 - 15.2 % Final   ??? MPV 03/13/2022 8.1  6.8 - 10.7 fL Final   ??? Platelet 03/13/2022 266  150 - 450 10*9/L Final   Office Visit on 02/19/2022   Component Date Value Ref Range Status   ??? Sodium 02/19/2022 141  135 - 145 mmol/L Final   ??? Potassium 02/19/2022 4.4  3.4 -  4.8 mmol/L Final   ??? Chloride 02/19/2022 101  98 - 107 mmol/L Final   ??? CO2 02/19/2022 31.5 (H)  20.0 - 31.0 mmol/L Final   ??? Anion Gap 02/19/2022 9  5 - 14 mmol/L Final   ??? BUN 02/19/2022 54 (H)  9 - 23 mg/dL Final   ??? Creatinine 02/19/2022 2.71 (H)  0.60 - 0.80 mg/dL Final   ??? BUN/Creatinine Ratio 02/19/2022 20   Final   ??? eGFR [name]-EPI (2021) Female 02/19/2022 17 (L)  >=60 mL/min/1.59m2 Final   ??? Glucose 02/19/2022 149  70 - 179 mg/dL Final   ??? Calcium 09/81/1914 8.9  8.7 - 10.4 mg/dL Final   Office Visit on 02/13/2022   Component Date Value Ref Range Status   ??? Sodium 02/13/2022 137  135 - 145 mmol/L Final   ??? Potassium 02/13/2022 4.4  3.4 - 4.8 mmol/L Final   ??? Chloride 02/13/2022 97 (L)  98 - 107 mmol/L Final   ??? CO2 02/13/2022 34.3 (H)  20.0 - 31.0 mmol/L Final   ??? Anion Gap 02/13/2022 6  5 - 14 mmol/L Final   ??? BUN 02/13/2022 66 (H)  9 - 23 mg/dL Final   ??? Creatinine 02/13/2022 3.20 (H)  0.60 - 0.80 mg/dL Final   ??? BUN/Creatinine Ratio 02/13/2022 21 Final   ??? eGFR [name]-EPI (2021) Female 02/13/2022 14 (L)  >=60 mL/min/1.74m2 Final   ??? Glucose 02/13/2022 142  70 - 179 mg/dL Final   ??? Calcium 78/29/5621 9.0  8.7 - 10.4 mg/dL Final   ??? BNP 30/86/5784 294.18 (H)  <=100 pg/mL Final   ??? Magnesium 02/13/2022 3.7 (H)  1.6 - 2.6 mg/dL Final   ??? Iron 69/62/9528 31 (L)  50 - 170 ug/dL Final   ??? TIBC 41/32/4401 345  250 - 425 ug/dL Final   ??? Iron Saturation (%) 02/13/2022 9 (L)  20 - 55 % Final   ??? Ferritin 02/13/2022 13.9  7.3 - 270.7 ng/mL Final   ??? WBC 02/13/2022 8.0  3.6 - 11.2 10*9/L Final   ??? RBC 02/13/2022 3.77 (L)  3.95 - 5.13 10*12/L Final   ??? HGB 02/13/2022 9.4 (L)  11.3 - 14.9 g/dL Final   ??? HCT 02/72/5366 30.2 (L)  34.0 - 44.0 % Final   ??? MCV 02/13/2022 80.2  77.6 - 95.7 fL Final   ??? MCH 02/13/2022 24.8 (L)  25.9 - 32.4 pg Final   ??? MCHC 02/13/2022 31.0 (L)  32.0 - 36.0 g/dL Final   ??? RDW 44/02/4741 19.3 (H)  12.2 - 15.2 % Final   ??? MPV 02/13/2022 7.9  6.8 - 10.7 fL Final   ??? Platelet 02/13/2022 275  150 - 450 10*9/L Final   Office Visit on 01/22/2022   Component Date Value Ref Range Status   ??? Spec Gravity/POC 01/22/2022 1.010  1.003 - 1.030 Final   ??? PH/POC 01/22/2022 5.5  5.0 - 9.0 Final   ??? Leuk Esterase/POC 01/22/2022 Negative  Negative Final   ??? Nitrite/POC 01/22/2022 Negative  Negative Final   ??? Protein/POC 01/22/2022 Negative  Negative Final   ??? UA Glucose/POC 01/22/2022 Negative  Negative Final   ??? Ketones, POC 01/22/2022 Negative  Negative Final   ??? Bilirubin/POC 01/22/2022 Negative  Negative Final   ??? Blood/POC 01/22/2022 Negative  Negative Final   ??? Urobilinogen/POC 01/22/2022 0.2  0.2 - 1.0 mg/dL Final   Office Visit on 12/30/2021   Component Date Value Ref Range Status   ??? Iron 12/30/2021  24 (L)  50 - 170 ug/dL Final   ??? TIBC 16/09/9603 337  250 - 425 ug/dL Final   ??? Iron Saturation (%) 12/30/2021 7 (L)  20 - 55 % Final   ??? BNP 12/30/2021 174.68 (H)  <=100 pg/mL Final   ??? Magnesium 12/30/2021 2.2  1.6 - 2.6 mg/dL Final   ??? Sodium 54/08/8118 139  135 - 145 mmol/L Final   ??? Potassium 12/30/2021 4.3  3.4 - 4.8 mmol/L Final   ??? Chloride 12/30/2021 102  98 - 107 mmol/L Final   ??? CO2 12/30/2021 26.1  20.0 - 31.0 mmol/L Final   ??? Anion Gap 12/30/2021 11  5 - 14 mmol/L Final   ??? BUN 12/30/2021 50 (H)  9 - 23 mg/dL Final   ??? Creatinine 12/30/2021 2.40 (H)  0.60 - 0.80 mg/dL Final   ??? BUN/Creatinine Ratio 12/30/2021 21   Final   ??? eGFR [name]-EPI (2021) Female 12/30/2021 20 (L)  >=60 mL/min/1.64m2 Final   ??? Glucose 12/30/2021 124  70 - 179 mg/dL Final   ??? Calcium 14/78/2956 9.7  8.7 - 10.4 mg/dL Final   ??? WBC 21/30/8657 7.9  3.6 - 11.2 10*9/L Final   ??? RBC 12/30/2021 4.29  3.95 - 5.13 10*12/L Final   ??? HGB 12/30/2021 10.6 (L)  11.3 - 14.9 g/dL Final   ??? HCT 84/69/6295 34.3  34.0 - 44.0 % Final   ??? MCV 12/30/2021 80.0  77.6 - 95.7 fL Final   ??? MCH 12/30/2021 24.8 (L)  25.9 - 32.4 pg Final   ??? MCHC 12/30/2021 31.0 (L)  32.0 - 36.0 g/dL Final   ??? RDW 28/41/3244 23.1 (H)  12.2 - 15.2 % Final   ??? MPV 12/30/2021 8.5  6.8 - 10.7 fL Final   ??? Platelet 12/30/2021 240  150 - 450 10*9/L Final   Appointment on 12/24/2021   Component Date Value Ref Range Status   ??? Sodium 12/24/2021 136  135 - 145 mmol/L Final   ??? Potassium 12/24/2021 4.1  3.4 - 4.8 mmol/L Final   ??? Chloride 12/24/2021 99  98 - 107 mmol/L Final   ??? CO2 12/24/2021 30.0  20.0 - 31.0 mmol/L Final   ??? Anion Gap 12/24/2021 7  5 - 14 mmol/L Final   ??? BUN 12/24/2021 67 (H)  9 - 23 mg/dL Final   ??? Creatinine 12/24/2021 2.83 (H)  0.60 - 0.80 mg/dL Final   ??? BUN/Creatinine Ratio 12/24/2021 24   Final   ??? eGFR [name]-EPI (2021) Female 12/24/2021 16 (L)  >=60 mL/min/1.41m2 Final   ??? Glucose 12/24/2021 190 (H)  70 - 179 mg/dL Final   ??? Calcium 12/30/7251 9.5  8.7 - 10.4 mg/dL Final   ??? Ferritin 66/44/0347 51.2  7.3 - 270.7 ng/mL Final   ??? WBC 12/24/2021 8.9  3.6 - 11.2 10*9/L Final   ??? RBC 12/24/2021 4.02  3.95 - 5.13 10*12/L Final   ??? HGB 12/24/2021 9.8 (L)  11.3 - 14.9 g/dL Final   ??? HCT 42/59/5638 31.8 (L)  34.0 - 44.0 % Final   ??? MCV 12/24/2021 79.1  77.6 - 95.7 fL Final   ??? MCH 12/24/2021 24.3 (L)  25.9 - 32.4 pg Final   ??? MCHC 12/24/2021 30.7 (L)  32.0 - 36.0 g/dL Final   ??? RDW 75/64/3329 22.7 (H)  12.2 - 15.2 % Final   ??? MPV 12/24/2021 8.7  6.8 - 10.7 fL Final   ??? Platelet 12/24/2021 325  150 -  450 10*9/L Final   ??? nRBC 12/24/2021 0  <=4 /100 WBCs Final   ??? Neutrophils % 12/24/2021 79.6  % Final   ??? Lymphocytes % 12/24/2021 9.3  % Final   ??? Monocytes % 12/24/2021 9.1  % Final   ??? Eosinophils % 12/24/2021 0.9  % Final   ??? Basophils % 12/24/2021 1.1  % Final   ??? Absolute Neutrophils 12/24/2021 7.1  1.8 - 7.8 10*9/L Final   ??? Absolute Lymphocytes 12/24/2021 0.8 (L)  1.1 - 3.6 10*9/L Final   ??? Absolute Monocytes 12/24/2021 0.8  0.3 - 0.8 10*9/L Final   ??? Absolute Eosinophils 12/24/2021 0.1  0.0 - 0.5 10*9/L Final   ??? Absolute Basophils 12/24/2021 0.1  0.0 - 0.1 10*9/L Final   ??? Anisocytosis 12/24/2021 Marked (A)  Not Present Final   ??? Smear Review Comments 12/24/2021    Final   Office Visit on 12/16/2021   Component Date Value Ref Range Status   ??? Sodium 12/16/2021 129 (L)  135 - 145 mmol/L Final   ??? Potassium 12/16/2021 3.8  3.4 - 4.8 mmol/L Final   ??? Chloride 12/16/2021 85 (L)  98 - 107 mmol/L Final   ??? CO2 12/16/2021 31.9 (H)  20.0 - 31.0 mmol/L Final   ??? Anion Gap 12/16/2021 12  5 - 14 mmol/L Final   ??? BUN 12/16/2021 92 (H)  9 - 23 mg/dL Final   ??? Creatinine 12/16/2021 4.66 (H)  0.60 - 0.80 mg/dL Final   ??? BUN/Creatinine Ratio 12/16/2021 20   Final   ??? eGFR [name]-EPI (2021) Female 12/16/2021 9 (L)  >=60 mL/min/1.20m2 Final   ??? Glucose 12/16/2021 212 (H)  70 - 179 mg/dL Final   ??? Calcium 16/09/9603 10.1  8.7 - 10.4 mg/dL Final   ??? BNP 12/16/2021 223.36 (H)  <=100 pg/mL Final   ??? Magnesium 12/16/2021 3.4 (H)  1.6 - 2.6 mg/dL Final   No results displayed because visit has over 200 results.      No results displayed because visit has over 200 results.      Office Visit on 11/14/2021   Component Date Value Ref Range Status   ??? Sodium 11/14/2021 138  135 - 145 mmol/L Final   ??? Potassium 11/14/2021 4.6  3.4 - 4.8 mmol/L Final   ??? Chloride 11/14/2021 97 (L)  98 - 107 mmol/L Final   ??? CO2 11/14/2021 33.2 (H)  20.0 - 31.0 mmol/L Final   ??? Anion Gap 11/14/2021 8  5 - 14 mmol/L Final   ??? BUN 11/14/2021 61 (H)  9 - 23 mg/dL Final   ??? Creatinine 11/14/2021 2.73 (H)  0.60 - 0.80 mg/dL Final   ??? BUN/Creatinine Ratio 11/14/2021 22   Final   ??? eGFR [name]-EPI (2021) Female 11/14/2021 17 (L)  >=60 mL/min/1.75m2 Final   ??? Glucose 11/14/2021 214 (H)  70 - 179 mg/dL Final   ??? Calcium 54/08/8118 9.0  8.7 - 10.4 mg/dL Final   ??? BNP 11/14/2021 300.97 (H)  <=100 pg/mL Final   ??? Sodium 11/14/2021 138  135 - 145 mmol/L Final   ??? Potassium 11/14/2021 5.0 (H)  3.4 - 4.8 mmol/L Final   ??? Chloride 11/14/2021 96 (L)  98 - 107 mmol/L Final   ??? CO2 11/14/2021 33.2 (H)  20.0 - 31.0 mmol/L Final   ??? Anion Gap 11/14/2021 9  5 - 14 mmol/L Final   ??? BUN 11/14/2021 63 (H)  9 - 23 mg/dL Final   ???  Creatinine 11/14/2021 2.77 (H)  0.60 - 0.80 mg/dL Final   ??? BUN/Creatinine Ratio 11/14/2021 23   Final   ??? eGFR [name]-EPI (2021) Female 11/14/2021 17 (L)  >=60 mL/min/1.24m2 Final   ??? Glucose 11/14/2021 217 (H)  70 - 179 mg/dL Final   ??? Calcium 16/09/9603 9.1  8.7 - 10.4 mg/dL Final   ??? WBC 54/08/8118 8.0  3.6 - 11.2 10*9/L Final   ??? RBC 11/14/2021 3.87 (L)  3.95 - 5.13 10*12/L Final   ??? HGB 11/14/2021 9.6 (L)  11.3 - 14.9 g/dL Final   ??? HCT 14/78/2956 31.2 (L)  34.0 - 44.0 % Final   ??? MCV 11/14/2021 80.5  77.6 - 95.7 fL Final   ??? MCH 11/14/2021 24.9 (L)  25.9 - 32.4 pg Final   ??? MCHC 11/14/2021 30.9 (L)  32.0 - 36.0 g/dL Final   ??? RDW 21/30/8657 17.6 (H)  12.2 - 15.2 % Final   ??? MPV 11/14/2021 8.1  6.8 - 10.7 fL Final   ??? Platelet 11/14/2021 274  150 - 450 10*9/L Final   ??? nRBC 11/14/2021 0  <=4 /100 WBCs Final   ??? Neutrophils % 11/14/2021 77.2  % Final   ??? Lymphocytes % 11/14/2021 10.2  % Final   ??? Monocytes % 11/14/2021 8.6  % Final   ??? Eosinophils % 11/14/2021 2.5  % Final   ??? Basophils % 11/14/2021 1.5  % Final   ??? Absolute Neutrophils 11/14/2021 6.2  1.8 - 7.8 10*9/L Final   ??? Absolute Lymphocytes 11/14/2021 0.8 (L)  1.1 - 3.6 10*9/L Final   ??? Absolute Monocytes 11/14/2021 0.7  0.3 - 0.8 10*9/L Final   ??? Absolute Eosinophils 11/14/2021 0.2  0.0 - 0.5 10*9/L Final   ??? Absolute Basophils 11/14/2021 0.1  0.0 - 0.1 10*9/L Final   ??? Anisocytosis 11/14/2021 Slight (A)  Not Present Final   There may be more visits with results that are not included.       Lab Results   Component Value Date    PRO-BNP 3,420.0 (H) 07/03/2021    PRO-BNP 905.0 (H) 03/21/2021    PRO-BNP 3,796.0 (H) 12/23/2020    Creatinine 2.33 (H) 03/13/2022    Creatinine 2.71 (H) 02/19/2022    Creatinine 0.79 01/06/2011    BUN 59 (H) 03/13/2022    BUN 54 (H) 02/19/2022    BUN 20 01/06/2011    Sodium 139 03/13/2022    Sodium 140 01/06/2011    Potassium 4.5 03/13/2022    Potassium 4.1 01/06/2011    CO2 27.4 03/13/2022    CO2 30 01/06/2011    Magnesium 3.0 (H) 03/13/2022    Magnesium 2.0 01/06/2011    Total Bilirubin 0.5 12/09/2021    INR 2.15 07/01/2021    INR 1.0 01/06/2011       Lab Results   Component Value Date    Digoxin Level 0.9 10/01/2020       Lab Results   Component Value Date    TSH 2.731 12/06/2021    Cholesterol 95 12/06/2021    Triglycerides 66 12/06/2021    HDL 42 12/06/2021    Non-HDL Cholesterol 53 (L) 12/06/2021    LDL Calculated 40 12/06/2021       Lab Results   Component Value Date    WBC 9.0 03/13/2022    WBC 12.5 (H) 01/06/2011    HGB 8.3 (L) 03/13/2022    HGB 12.9 01/06/2011    HCT 27.5 (L) 03/13/2022    HCT  40.8 01/06/2011    Platelet 266 03/13/2022    Platelet 260 01/06/2011       Pertinent Test Results from Today:  None    Other pertinent records were reviewed.    The following are further history from the patient's EPIC record for reference:     Past Medical History:   Diagnosis Date   ??? Acute kidney injury superimposed on chronic kidney disease (CMS-HCC) 10/11/2020   ??? Acute on chronic diastolic (congestive) heart failure (CMS-HCC) 08/23/2020   ??? Arthritis    ??? Calculus of kidney    ??? Calculus of ureter    ??? CHF (congestive heart failure) (CMS-HCC)    ??? Chronic atrial fibrillation (CMS-HCC) 07/20/2019   ??? COPD (chronic obstructive pulmonary disease) (CMS-HCC)    ??? Diabetes (CMS-HCC)    ??? Gangrenous cholecystitis 10/11/2020   ??? GERD (gastroesophageal reflux disease)    ??? Hydronephrosis    ??? Hypertension    ??? Hyponatremia 10/11/2020   ??? Intermediate coronary syndrome (CMS-HCC) 03/13/2014   ??? Lower extremity edema 09/28/2020   ??? Lumbar stenosis    ??? Microscopic hematuria    ??? Nausea alone    ??? Nephrolithiasis 04/17/2016   ??? Neuropathy    ??? Nocturia    ??? Other chronic cystitis    ??? Pulmonary hypertension (CMS-HCC)    ??? Renal colic    ??? Sleep apnea    ??? Unstable angina pectoris (CMS-HCC) 03/13/2014       Past Surgical History:   Procedure Laterality Date   ??? BACK SURGERY  1995   ??? CARPAL TUNNEL RELEASE Left 2014   ??? HYSTERECTOMY  1971   ??? IR INSERT CHOLECYSTOSMY TUBE PERCUTANEOUS  10/02/2020    IR INSERT CHOLECYSTOSMY TUBE PERCUTANEOUS 10/02/2020 Braulio Conte, MD IMG [facility]   ??? LUMBAR DISC SURGERY     ??? PR REMOVAL GALLBLADDER N/A 10/06/2020    Procedure: CHOLECYSTECTOMY;  Surgeon: Katherina Mires, MD;  Location: [facility]IN [facility];  Service: Trauma   ??? PR RIGHT HEART CATH O2 SATURATION & CARDIAC OUTPUT N/A 09/30/2020    Procedure: Right Heart Catheterization;  Surgeon: Neal Dy, MD;  Location: [facility] CATH;  Service: Cardiology   ??? PR RIGHT HEART CATH O2 SATURATION & CARDIAC OUTPUT N/A 01/09/2021    Procedure: Right Heart Catheterization;  Surgeon: Lesle Reek, MD;  Location: [facility] CATH;  Service: Cardiology

## 2022-03-16 LAB — HEMOGLOBIN A1C
ESTIMATED AVERAGE GLUCOSE: 114 mg/dL
HEMOGLOBIN A1C: 5.6 % (ref 4.8–5.6)

## 2022-03-16 MED FILL — VYNDAMAX 61 MG CAPSULE: ORAL | 30 days supply | Qty: 30 | Fill #2

## 2022-03-16 NOTE — Unmapped (Signed)
Addended by: Hope Budds on: 03/16/2022 10:56 AM     Modules accepted: Orders

## 2022-03-16 NOTE — Unmapped (Signed)
Called pt/daughter this morning (03/16/22) in response to mychart messages from the weekend. Pt has decided she does not want to pursue CardioMEMS at this time. Also not interested in [place]. Discussed that even after replacing iron, we may still have ongoing/repeat issues with down-trending hemoglobin if we re-initiate Eliquis and thus would recommend staying off of it for now. She has been maintaining NSR, so could re-visit should afib recur. Discussed further workup may be indicated (colonoscopy) which pt would not want to undergo, feels like it's in God's hands.

## 2022-03-18 DIAGNOSIS — J302 Other seasonal allergic rhinitis: Principal | ICD-10-CM

## 2022-03-18 MED ORDER — MONTELUKAST 10 MG TABLET
ORAL_TABLET | Freq: Every day | ORAL | 2 refills | 90 days | Status: CP
Start: 2022-03-18 — End: 2023-02-19

## 2022-03-18 MED ORDER — TORSEMIDE 20 MG TABLET
ORAL_TABLET | ORAL | 3 refills | 0 days | Status: CP
Start: 2022-03-18 — End: 2023-03-18

## 2022-03-19 ENCOUNTER — Telehealth
Admit: 2022-03-19 | Discharge: 2022-03-20 | Payer: MEDICARE | Attending: Student in an Organized Health Care Education/Training Program | Primary: Student in an Organized Health Care Education/Training Program

## 2022-03-19 DIAGNOSIS — N390 Urinary tract infection, site not specified: Principal | ICD-10-CM

## 2022-03-19 MED ORDER — FOSFOMYCIN TROMETHAMINE 3 GRAM ORAL PACKET
ORAL | 3 refills | 84 days | Status: CP
Start: 2022-03-19 — End: 2023-02-18

## 2022-03-19 NOTE — Unmapped (Signed)
Thank you for coming in today.    Please note that your laboratory and other results may be visible to you in real time, possibly before they reach your provider. Please allow 48 hours for clinical interpretation of these results. Importantly, even if a result is flagged as abnormal, it may not be one that impacts your health.    The ID clinic phone number is [phone] (toll free [phone]).  The ID clinic fax number is [phone].    For urgent issues on nights and weekends you may reach the ID Physician on call through the [facility] Operator at [phone].     Chriss Driver, MD  [facility] at [facility]  [address]   [place], [place] [address]  Phone: [phone]   Fax: [phone]

## 2022-03-19 NOTE — Unmapped (Signed)
[facility] [facility] Checklist     Type of visit:  video    Are you located in [place]? yes    Reason for visit: medication    Questions / Concerns that need to be addressed: medication    General Consent to Treat (GCT) for epic video visits only: Verbal consent    HCDM reviewed and updated in Epic:    We are working to make sure all of our patients??? wishes are updated in Epic and part of that is documenting a Environmental health practitioner for each patient  A Health Care Decision Maker is someone you choose who can make health care decisions for you if you are not able - who would you most want to do this for you????  is already up to date.    HCDM (patient stated preference): Barbara Huber,Barbara Huber - Daughter - [phone]    COVID-19 Vaccine Summary  Which COVID-19 Vaccine was administered  Pfizer  Type:  Dates Given:  11/14/2021     Date last COVID Positive Test:   06/03/2021               If no: Are you interested in scheduling? Declines vaccine

## 2022-03-19 NOTE — Unmapped (Signed)
ID Clinic Note - Follow-up Visit    The patient reports they are currently: at home. I spent 10 minutes on the real-time audio and video with the patient on the date of service. I spent an additional 15 minutes on pre- and post-visit activities on the date of service.     The patient was physically located in [place] or a state in which I am permitted to provide care. The patient and/or parent/guardian understood that s/he may incur co-pays and cost sharing, and agreed to the telemedicine visit. The visit was reasonable and appropriate under the circumstances given the patient's presentation at the time.    The patient and/or parent/guardian has been advised of the potential risks and limitations of this mode of treatment (including, but not limited to, the absence of in-person examination) and has agreed to be treated using telemedicine. The patient's/patient's family's questions regarding telemedicine have been answered.     If the visit was completed in an ambulatory setting, the patient and/or parent/guardian has also been advised to contact their provider???s office for worsening conditions, and seek emergency medical treatment and/or call 911 if the patient deems either necessary.    ASSESSMENT:  Barbara Huber is a 81 y.o. 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 Brooke Dare for frequent UTIs and prevents today for follow-up after starting fosfomycin ppx in June 2022. She is currently doing well without any urinary symptoms. No interim UTIs since last visit 11/2021. She is not having any side effects from the fosfomycin and would like to continue for prophylaxis at this time to minimize her hospitalizations.     PLAN:  - CONTINUE fosfomycin 3g weekly. Refill sent today  - Follow-up by video visit in 6 months    ID Problem List:  ?? Frequent UTIs  ?? H/o ESBL E.coli from urine   ?? Long term current use of antibiotics     PRIOR ID HISTORY  ?? Gangrenous cholecystitis s/p perc cholecystostomy tube 10/02/20 w/ cx + Clostridium perfringens and E coli - seen by ID while inpatient  ??  RELEVANT NON-ID DIAGNOSES:  ?? 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 Atrial fibrillation on Eliquis  ?? T2DM  ?? [name]  ?? OSA      Lysle Morales, MD   Fellow, [facility] Division of Infectious Diseases    [facility]   [address], 5th floor  [place], [place] [address]  Phone: [phone]   Fax: [phone]     _____________________________________________________________________      CC: recurrent UTI      HPI: 81 y.o. 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 Brooke Dare for frequent UTIs.??    Please see initial consult note from 05/28/21 for full details of history. At last telehealth visit 11/2021, we elected to continue fosfomycin prophylaxis at it has been well tolerated with success in preventing further UTIs. In addition, she has not required hospitalization for her heart failure since she was last seen by [place] in clinic. She is meeting her goals of maximizing time at home. She has not had any recent symptoms of suprapubic pain, flank pain, dysuria, or hematuria. She would like to continue fosfomycin at this time. Refill sent today.     Past Medical History:  Diagnosis Date   ??? Acute kidney injury superimposed on chronic kidney disease (CMS-HCC) 10/11/2020   ??? Acute on chronic diastolic (congestive) heart failure (CMS-HCC) 08/23/2020   ??? Arthritis    ??? Calculus of kidney    ??? Calculus of ureter    ??? CHF (congestive heart failure) (CMS-HCC)    ??? Chronic atrial fibrillation (CMS-HCC) 07/20/2019   ??? COPD (chronic obstructive pulmonary disease) (CMS-HCC)    ??? Diabetes (CMS-HCC)    ??? Gangrenous cholecystitis 10/11/2020   ??? GERD (gastroesophageal reflux disease)    ??? Hydronephrosis    ??? Hypertension    ??? Hyponatremia 10/11/2020   ??? Intermediate coronary syndrome (CMS-HCC) 03/13/2014   ??? Lower extremity edema 09/28/2020   ??? Lumbar stenosis    ??? Microscopic hematuria    ??? Nausea alone    ??? Nephrolithiasis 04/17/2016   ??? Neuropathy    ??? Nocturia    ??? Other chronic cystitis    ??? Pulmonary hypertension (CMS-HCC)    ??? Renal colic    ??? Sleep apnea    ??? Unstable angina pectoris (CMS-HCC) 03/13/2014       Past Surgical History:   Procedure Laterality Date   ??? BACK SURGERY  1995   ??? CARPAL TUNNEL RELEASE Left 2014   ??? HYSTERECTOMY  1971   ??? IR INSERT CHOLECYSTOSMY TUBE PERCUTANEOUS  10/02/2020    IR INSERT CHOLECYSTOSMY TUBE PERCUTANEOUS 10/02/2020 Braulio Conte, MD IMG [facility]IR [facility]   ??? LUMBAR DISC SURGERY     ??? PR REMOVAL GALLBLADDER N/A 10/06/2020    Procedure: CHOLECYSTECTOMY;  Surgeon: Katherina Mires, MD;  Location: MAIN OR [facility];  Service: Trauma   ??? PR RIGHT HEART CATH O2 SATURATION & CARDIAC OUTPUT N/A 09/30/2020    Procedure: Right Heart Catheterization;  Surgeon: Neal Dy, MD;  Location: [facility] CATH;  Service: Cardiology   ??? PR RIGHT HEART CATH O2 SATURATION & CARDIAC OUTPUT N/A 01/09/2021    Procedure: Right Heart Catheterization;  Surgeon: Lesle Reek, MD;  Location: [facility] CATH;  Service: Cardiology       Allergies   Allergen Reactions   ??? Nitrofurantoin Anaphylaxis and Other (See Comments)   ??? Lipitor [Atorvastatin] Muscle Pain     SOB, Headache, fatigue, sick on my stomach       Current Outpatient Medications   Medication Sig Dispense Refill   ??? ACCU-CHEK AVIVA PLUS TEST STRP Strp UESE TO CHECK BLOOD SUGAR 3 TIMES A DAY BEFORE MEALS 100 each 11   ??? acetaminophen (TYLENOL) 500 MG tablet Take 2 tablets (1,000 mg total) by mouth daily as needed for pain.     ??? ADVAIR HFA 115-21 mcg/actuation inhaler INHALE TWO PUFFS BY MOUTH TWICE A DAY 12 g 11   ??? albuterol HFA 90 mcg/actuation inhaler Inhale 2 puffs every eight (8) hours as needed for wheezing.     ??? amiodarone (PACERONE) 200 MG tablet Take 1 tablet (200 mg total) by mouth daily. 90 tablet 3   ??? apixaban (ELIQUIS) 2.5 mg Tab Take 1 tablet (2.5 mg total) by mouth Two (2) times a day. 60 tablet 11   ??? cranberry 500 mg cap Take 500 mg by mouth daily with evening meal.     ??? estradioL (ESTRACE) 0.01 % (0.1 mg/gram) vaginal cream INSERT ONE GRAM VAGINALLY AT BEDTIME     ??? gabapentin (NEURONTIN) 100 MG capsule Take 2 capsules (200 mg total) by mouth daily as needed (pain). 270 capsule 3   ???  insulin glargine (BASAGLAR, LANTUS) 100 unit/mL (3 mL) injection pen Inject 0.04 mL (4 Units total) under the skin nightly. 15 mL 3   ??? lancets Misc 1 each by Miscellaneous route daily. Accu Check 100 each 6   ??? montelukast (SINGULAIR) 10 mg tablet Take 1 tablet (10 mg total) by mouth in the morning. 90 tablet 2   ??? NARCAN 4 mg/actuation nasal spray 1 spray into alternating nostrils once as needed (opioid overdose). PRN - Emergency use.     ??? NON FORMULARY APPLY FROM NECK DOWN TWICE A DAY     ??? oxyCODONE myristate 9 mg CSpT Take 1 capsule (9 mg total) by mouth two (2) times a day.     ??? OXYGEN-AIR DELIVERY SYSTEMS MISC 5 L by Miscellaneous route. Currently using 3  L/min via [place]     ??? pantoprazole (PROTONIX) 20 MG tablet Take 1 tablet (20 mg total) by mouth in the morning. 60 tablet 5   ??? 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 rosuvastatin (CRESTOR) 5 MG tablet Take 1 tablet (5 mg total) by mouth every other day. 15 tablet 11   ??? sertraline (ZOLOFT) 25 MG tablet Take 1 tablet (25 mg total) by mouth daily. 90 tablet 3   ??? SPIRIVA RESPIMAT 2.5 mcg/actuation inhalation mist INHALE TWO PUFFS BY MOUTH ONCE DAILY 4 g 11   ??? spironolactone (ALDACTONE) 25 MG tablet Take 1 tablet (25 mg total) by mouth daily.     ??? tafamidis (VYNDAMAX) 61 mg cap Take 1 capsule (61 mg) by mouth daily. 30 capsule 11   ??? torsemide (DEMADEX) 20 MG tablet Take 2 tablets (40 mg total) by mouth daily. May also take 1 tablet (20 mg total) daily as needed (weight gain for 3lbs overnight or 5lbs in a week). 180 tablet 3   ??? varicella-zoster [id number], PF, (SHINGRIX, PF,) 50 mcg/0.5 mL SusR injection Inject 0.5 mL into the muscle. 0.5 mL 1   ??? fosfomycin (MONUROL) 3 gram Pack Take 3 g by mouth once a week. 36 g 3   ??? metOLazone (ZAROXOLYN) 5 MG tablet Take 1 tablet (5 mg total) by mouth daily as needed (when instructed by cardiology clinic). 30 tablet 1     No current facility-administered medications for this visit.       Social history:  H/o 2 vaginal births. Hysterectomy + oopherectomy 50 years ago. No tobacco use.    Social History     Social History Narrative    Previously worked in Designer, fashion/clothing, retired.  Daughter is Economist who works at [employer].  Her niece is Selena Batten, [employer] Anesthesia Tech.  Lives in [place] with daughter and son-in-law.       Family History   Problem Relation Age of Onset   ??? Hypertension Mother    ??? Cancer Father         COLON CANCER   ??? Anesthesia problems Neg Hx    ??? Broken bones Neg Hx    ??? Clotting disorder Neg Hx    ??? Collagen disease Neg Hx    ??? Diabetes Neg Hx    ??? Dislocations Neg Hx    ??? Fibromyalgia Neg Hx    ??? Gout Neg Hx    ??? Hemophilia Neg Hx    ??? Osteoporosis Neg Hx    ??? Rheumatologic disease Neg  Hx    ??? Scoliosis Neg Hx    ??? Severe sprains Neg Hx    ??? Sickle cell anemia Neg Hx    ??? Spinal Compression Fracture Neg Hx    ??? GU problems Neg Hx    ??? Kidney cancer Neg Hx    ??? Prostate cancer Neg Hx        ROS: All other systems reviewed and negative except as per HPI.     No physical exam or vitals - video visit today.     Labs:    Lab Results   Component Value Date    WBC 9.0 03/13/2022    WBC 8.0 02/13/2022    WBC 12.5 (H) 01/06/2011    Absolute Neutrophils 7.1 12/24/2021    Absolute Lymphocytes 0.8 (L) 12/24/2021    Absolute Eosinophils 0.1 12/24/2021    HGB 8.3 (L) 03/13/2022    HGB 12.9 01/06/2011    HCT 27.5 (L) 03/13/2022    HCT 40.8 01/06/2011 Platelet 266 03/13/2022    Platelet 275 02/13/2022    Platelet 260 01/06/2011    Sodium 139 03/13/2022    Sodium 140 01/06/2011    Potassium 4.5 03/13/2022    Potassium 4.1 01/06/2011    BUN 59 (H) 03/13/2022    BUN 20 01/06/2011    Creatinine 2.33 (H) 03/13/2022    Creatinine 2.71 (H) 02/19/2022    Creatinine 0.79 01/06/2011    Glucose 188 (H) 03/13/2022    Magnesium 3.0 (H) 03/13/2022    Magnesium 2.0 01/06/2011    T Albumin 3.7 11/29/2020    Albumin 3.1 (L) 12/09/2021    Total Bilirubin 0.5 12/09/2021    AST 10 12/09/2021    ALT <7 (L) 12/09/2021    Alkaline Phosphatase 63 12/09/2021    INR 2.15 07/01/2021    INR 1.0 01/06/2011    Sed Rate 21 04/28/2020    CRP <4.0 06/03/2021    CRP 9.1 04/27/2020       Urinalysis:   Lab Results   Component Value Date    WBC, UA 5 09/10/2014    RBC, UA 2 09/10/2014    Squam Epithel, UA 13 (H) 12/06/2021    Squam Epithel, UA 2 09/10/2014    Bacteria, UA Few (A) 12/06/2021    Bacteria, UA FEW 09/10/2014    Leukocyte Esterase, UA Moderate (A) 12/06/2021    Leukocyte Esterase, UA TRACE 09/10/2014    Nitrite, UA Negative 12/06/2021    Nitrite, UA POSITIVE 09/10/2014    Protein, UA Trace (A) 12/06/2021    Protein, UA NEGATIVE 09/10/2014    Blood, UA Negative 12/06/2021    Blood, UA NEGATIVE 09/10/2014    pH, UA 5.0 12/06/2021    pH, UA 6.0 09/10/2014       Microbiology:  ??  Susceptibility Tests        Amoxicillin + Clavulanate Ampicillin Ampicillin + Sulbactam Aztreonam Cefazolin Cefepime Ceftazidime Ceftriaxone Cephalexin Ciprofloxacin Doxycycline Ertapenem Gentamicin Levofloxacin Meropenem Nitrofurantoin Piperacillin + Tazobactam Tetracycline Tobramycin Trimethoprim + Sulfamethoxazole Vancomycin Screen    Collected Procedure Specimen Type Organism                         12/06/21 Urine Culture Urine Klebsiella pneumoniae S R S  S    S S   S S  I S S S S        Streptococcus bovis group  05/06/21 Urine Culture Urine Vancomycin Resistant Enterococcus faecium  R S     R     R    02/26/21 Urine Culture Urine Escherichia coli  S   S    S S   S S  S S S S S     02/05/21 Urine Culture Urine Escherichia coli  S   S    S S   S S  S S S S S     01/21/21 Urine Culture Urine Enterococcus faecalis  S         S     S     S       Escherichia coli  S   S    S S   S S  S S S S S        Aerococcus urinae                         01/03/21 Urine Culture Urine Escherichia coli  R  R R R I R R R  S S R S S  R S R     09/28/20 Urine Culture Urine Escherichia coli  R I R R R R R R R  S S R S S  R S R        Streptococcus bovis group                         08/23/20 Urine Culture Urine Escherichia coli  R I R R R R R R R  S S R S S  R S R     04/27/20 Urine Culture Urine Klebsiella oxytoca S R S  I  S S  S   S S  S S S S S     06/18/18 Urine Culture Urine Escherichia coli  R I  R   S  R   S R  S   S R     01/21/17 Urine culture Urine Proteus mirabilis  S   S     S   S S  R   S S                   Imaging:   None

## 2022-03-25 NOTE — Unmapped (Signed)
PA submitted to [facility] for Fosfomycin 3 g weekly via Covermymeds.    Key: [id number]  PA case nubmer: [id number]  Status: Approved from 12/28/2021 - 12/27/2022    Maryclare Bean, PharmD, BCPS, CPP   OPAT/ID Clinic Pharmacist     Time Spent:  15 Minutes

## 2022-03-26 DIAGNOSIS — I5032 Chronic diastolic (congestive) heart failure: Principal | ICD-10-CM

## 2022-03-26 DIAGNOSIS — I10 Essential (primary) hypertension: Principal | ICD-10-CM

## 2022-03-26 DIAGNOSIS — E1122 Type 2 diabetes mellitus with diabetic chronic kidney disease: Principal | ICD-10-CM

## 2022-03-26 DIAGNOSIS — N1832 Type 2 diabetes mellitus with stage 3b chronic kidney disease, without long-term current use of insulin (CMS-HCC): Principal | ICD-10-CM

## 2022-03-26 NOTE — Unmapped (Signed)
I was immediately available via phone/pager or present on site. I reviewed and discussed the case with Dr. Yancey Flemings, but did not see the patient. I agree with the  assessment and plan as documented in the note.  (Patient disconnected from call before I could hop on.)      Ladona Horns, MD, FIDSA  Associate Professor of Medicine  [facility] Division of Infectious Diseases

## 2022-03-26 NOTE — Unmapped (Signed)
[facility] Internal Medicine   CHRONIC CARE MANAGEMENT OUTREACH ENCOUNTER           Date of Service:  03/26/2022      Service:  Care Coordination - phone  Is there someone else in the room? No.   MyChart use by patient is active: yes    Post-outreach Action Items:  Provider: No/none.  CM: No/none.  Patient: No/none.    Follow-up Next Call: N/A    Chronic Care Management (CCM) Outreach  Purpose of outreach:  Diabetes, Heart Failure, and Hypertension   Care Manager (CM) completed the following:  Reviewed chart  Identified following:   Follows with cardiology, ID clinic, and nephrology  Recent visit with ID clinic for recurring UTIs    Call placed to Ms. Melder re: items above and discussed the following:     Updates  New concerns or symptoms: No/ none.  Updates on ongoing concerns/symptoms:  Patient shared that things are going pretty good.   Diabetes:   Blood glucose is well-controlled.   Reading this morning was 132.  Hypertension:  Reading this morning: 129/69 63 (PTHomeBP)  Have blood pressure cuff at home?: yes- arm cuff  Regularly checking blood pressure?: yes  Experienced really high blood pressure (systolic >180 mmHg or diastolic >110 mmHg)?: no  Experienced really low blood pressure (systolic <90 mmHg or diastolic <60 mmHg)?: no  Symptoms of low blood pressures? Fatigue, though she attributes this to anemia  Symptoms of high blood pressures? No  Anemia:  Waiting on iron infusions which are scheduled to start on May 05, 2022.   Reviewed upcoming infusion appointments with patient.   She is feeling a little better since starting iron pills and notes less fatigue than before.  Daughter helps with MyChart with appointment scheduling and MyChart.   New barriers: No/ none.  Recent hospitalization / ED / Urgent Care or providers seen outside the [facility] system: No/ none.  Refills needed: No/ none.  Medication response/ notes: No/none.    Wrap-up  Reviewed upcoming clinic appointment(s): Yes.  Plans to keep appointment(s): Yes.  Transportation assistance needed: No.  Patient was encouraged to reach out to their provider with any questions or concerns  Future Appointments   Date Time Provider Department Center   04/22/2022  3:45 PM Artelia Laroche, MD [facility]   04/23/2022  1:00 PM Abhijit Lonia Blood, MD [facility]   05/05/2022  2:45 PM UNCTIF RN 6 [facility]   05/07/2022  2:45 PM UNCTIF RN 5 [facility]   05/08/2022  1:30 PM Zannie Cove, MD [facility]   05/11/2022  2:00 PM UNCTIF RN 5 [facility]   05/13/2022  1:30 PM [name] Karmen Stabs, AGNP [facility]   05/13/2022  2:45 PM UNCTIF RN 6 [facility]   05/15/2022  2:45 PM UNCTIF RN 5 [facility]       A copy of this Patient Outreach/CCM Encounter was sent to patient's Primary Care Provider     CCM Documentation  Time spent in direct care with patient and/or health care proxy via non-in-person encounter(s): 10 min  Time spent in indirect patient care and coordination: 10 min

## 2022-03-28 NOTE — Unmapped (Signed)
I was the supervising physician in the delivery of the service. Mylin Gignac R Jenisis Harmsen, MD

## 2022-04-08 NOTE — Unmapped (Signed)
[facility] Refill Coordination Note    Specialty Medication(s) to be Shipped:   General Specialty: Vyndamax    Other medication(s) to be shipped: No additional medications requested for fill at this time     Barbara Huber, DOB: [birth year]  Phone: [phone] (home)       All above HIPAA information was verified with patient's family member, daughter.     Was a Nurse, learning disability used for this call? No    Completed refill call assessment today to schedule patient's medication shipment from the [facility] [phone]).  All relevant notes have been reviewed.     Specialty medication(s) and dose(s) confirmed: Regimen is correct and unchanged.   Changes to medications: Briceida reports no changes at this time.  Changes to insurance: No  New side effects reported not previously addressed with a pharmacist or physician: None reported  Questions for the pharmacist: No    Confirmed patient received a Conservation officer, historic buildings and a Surveyor, mining with first shipment. The patient will receive a drug information handout for each medication shipped and additional FDA Medication Guides as required.       DISEASE/MEDICATION-SPECIFIC INFORMATION        N/A    SPECIALTY MEDICATION ADHERENCE     Medication Adherence    Patient reported X missed doses in the last month: 0  Specialty Medication: Vyndamax 61mg   Patient is on additional specialty medications: No  Patient is on more than two specialty medications: No  Any gaps in refill history greater than 2 weeks in the last 3 months: no  Demonstrates understanding of importance of adherence: yes  Informant: child/children              Were doses missed due to medication being on hold? No    Vyndamax 61mg : Patient has 14 days of medication on hand    REFERRAL TO PHARMACIST     Referral to the pharmacist: Not needed      [facility]     Shipping address confirmed in Epic.     Delivery Scheduled: Yes, Expected medication delivery date: 4/19.     Medication will be delivered via Next Day Courier to the prescription address in Epic WAM.    Olga Millers   [facility] Specialty Technician

## 2022-04-14 MED FILL — VYNDAMAX 61 MG CAPSULE: ORAL | 30 days supply | Qty: 30 | Fill #3

## 2022-04-22 ENCOUNTER — Telehealth: Admit: 2022-04-22 | Discharge: 2022-04-23 | Payer: MEDICARE

## 2022-04-22 DIAGNOSIS — D500 Iron deficiency anemia secondary to blood loss (chronic): Principal | ICD-10-CM

## 2022-04-22 DIAGNOSIS — N184 Chronic kidney disease, stage 4 (severe): Principal | ICD-10-CM

## 2022-04-22 DIAGNOSIS — E854 Organ-limited amyloidosis: Principal | ICD-10-CM

## 2022-04-22 DIAGNOSIS — N1832 Type 2 diabetes mellitus with stage 3b chronic kidney disease, without long-term current use of insulin (CMS-HCC): Principal | ICD-10-CM

## 2022-04-22 DIAGNOSIS — I10 Essential (primary) hypertension: Principal | ICD-10-CM

## 2022-04-22 DIAGNOSIS — G629 Polyneuropathy, unspecified: Principal | ICD-10-CM

## 2022-04-22 DIAGNOSIS — I43 Cardiomyopathy in diseases classified elsewhere: Principal | ICD-10-CM

## 2022-04-22 DIAGNOSIS — I4819 Other persistent atrial fibrillation: Principal | ICD-10-CM

## 2022-04-22 DIAGNOSIS — E1122 Type 2 diabetes mellitus with diabetic chronic kidney disease: Principal | ICD-10-CM

## 2022-04-22 MED ORDER — FERROUS SULFATE 325 MG (65 MG IRON) TABLET
ORAL_TABLET | ORAL | 3 refills | 90 days | Status: CP
Start: 2022-04-22 — End: 2023-04-22

## 2022-04-22 MED ORDER — GABAPENTIN 300 MG CAPSULE
ORAL_CAPSULE | Freq: Two times a day (BID) | ORAL | 3 refills | 90 days | Status: CP
Start: 2022-04-22 — End: 2023-04-22

## 2022-04-22 NOTE — Unmapped (Signed)
Internal Medicine Clinic Visit  Person Contacted: Patient  Contact Phone number: [phone] (home)   Is there someone else in the room? Yes. What is your relationship? daughter. Do you want this person here for the visit? yes.  Reason for visit: Follow up DM, ht failure    A/P:    Neuropathy  Says this is what bothers her the most.   Had been weaned off oxycontin and gabapentin.   I see from her PDMP that she is getting oxy again.   Plan: increase gabapentin slightly: up to 400 mg bid, watching for side effect   Iron deficiency anemia due to chronic blood loss  Overview:  Lab Results   Component Value Date    FERRITIN 13.9 02/13/2022     Lab Results   Component Value Date    WBC 9.0 03/13/2022    RBC 3.63 (L) 03/13/2022    HGB 8.3 (L) 03/13/2022    HCT 27.5 (L) 03/13/2022    MCV 75.8 (L) 03/13/2022    MCH 22.9 (L) 03/13/2022    MCHC 30.2 (L) 03/13/2022    RDW 18.0 (H) 03/13/2022    PLT 266 03/13/2022    MPV 8.1 03/13/2022     Patient has iron deficiency anemia.  Taking po iron every other day.   Has iron infusions scheduled.   Plan: Will check ferritin and iron panel and CBC today since she is feeling better on po iron      Essential hypertension  History of hypertension, now stable.   On spirinolactone 25 mg every day.Torsemide-40 mg qd         Plan: continue treatment-  Lab Results   Component Value Date    CREATININE 2.33 (H) 03/13/2022     Lab Results   Component Value Date    K 4.5 03/13/2022       Persistent atrial fibrillation (CMS-HCC)  On amiodarone 200 mg every day,   12/2020 EKG shows NSR, by report  HR 72 today.   Off apixaban, due to downtrending Hb  Plan: continue treatment    Cardiac amyloidosis (CMS-HCC)  Overview:  Diagnosed 10/2020 on NM Spect. Followed at [facility] cardiology  On tafamidis    Assessment & Plan:  Reports intermittent dyspnea on exertion.   Wt stable  Plan: cont current medsFollow up with cardiology      Type 2 diabetes mellitus with stage 3b chronic kidney disease, without long-term current use of insulin (CMS-HCC)  Overview:  Lab Results   Component Value Date    A1C 5.6 03/13/2022    A1C 7.0 (H) 12/06/2021    A1C 5.9 08/27/2021     Diabetes, on lantus 4 unit qhs at bedtime. Due to low GFR, off other meds. Did not tolerate jardiance, due to UTI  BS s at home in range, no hypoglycemia  Plan: cont. Same dose    Chronic kidney disease ([name]), stage IV (severe) (CMS-HCC)    Lab Results   Component Value Date    CREATININE 2.33 (H) 03/13/2022     Creatinine sl improved today.   Anticipate maintaining current dose of torsemide 40 mg every day. Also on spirinolactone.     Follows with [facility] nephrology    Next Visit:   Check A1C  No follow-ups on file.    HPI:   Pt is a 81 y.o. 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   Feeling pretty good. Some days  are better than others.   Has been taking iron pills every other day.   Blood sugars-around 130. Was 109 today-taking 4 units at night.   Feels like her neuropathy is worse since stopping her gabapentin. That's what's bothering her the most.   Breathing-okay.   Wt is good-around 209, 210.   BP 134/67  Urinating fine.   Lab Results   Component Value Date    A1C 5.6 03/13/2022       Problem List:  Patient Active Problem List   Diagnosis    Spinal stenosis of lumbar region    Thoracic or lumbosacral neuritis or radiculitis    Lumbosacral spondylosis    COPD (chronic obstructive pulmonary disease) (CMS-HCC)    Sleep apnea in adult    Essential hypertension    Type 2 diabetes mellitus with stage 3b chronic kidney disease, without long-term current use of insulin (CMS-HCC)    Chronic cystitis    Incomplete bladder emptying    Nocturia    Peripheral neuropathy    Chronic respiratory failure with hypoxia (CMS-HCC)    Anemia    Persistent atrial fibrillation (CMS-HCC)    Anemia in chronic kidney disease    Chronic kidney disease ([name]), stage IV (severe) (CMS-HCC)    Enrolled in chronic care management    Pulmonary hypertension (CMS-HCC)    Shortness of breath    Acute on chronic diastolic congestive heart failure (CMS-HCC)    Chronic prescription opiate use    Dyspnea on exertion    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)    Cardiac amyloidosis (CMS-HCC)    Recurrent cold sores    Persistent fatigue after COVID-19    Dyspepsia    Iron deficiency anemia due to chronic blood loss    Generalized edema     Neuropathy       Medications:  Reviewed in EPIC    Social history:       Physical Exam:   Vital Signs:  BP Readings from Last 3 Encounters:   03/13/22 111/47   02/19/22 102/48   02/13/22 108/44      Wt Readings from Last 3 Encounters:   03/13/22 95.3 kg (210 lb)   02/19/22 96.2 kg (212 lb)   02/13/22 95.7 kg (211 lb)      General appearance -   Looks well, is wearing oxygen    Records review  Lab Results   Component Value Date    A1C 5.6 03/13/2022        Health Maintenance Due   Topic Date Due    Retinal Eye Exam  Never done    DTaP/Tdap/Td Vaccines (1 - Tdap) Never done    DEXA Scan-Start Age 23  Never done    Foot Exam  02/04/2022      The ASCVD Risk score (Arnett DK, et al., 2019) failed to calculate.     Medication adherence and barriers to the treatment plan have been addressed. Opportunities to optimize healthy behaviors have been discussed. Patient / caregiver voiced understanding.      The patient reports they are currently: at home. I spent 10 minutes on the real-time audio and video with the patient on the date of service. I spent an additional 16 minutes on pre- and post-visit activities on the date of service.     The patient was physically located in [place] or a state in which I am permitted to provide care. The patient and/or parent/guardian understood that s/he may  incur co-pays and cost sharing, and agreed to the telemedicine visit. The visit was reasonable and appropriate under the circumstances given the patient's presentation at the time.    The patient and/or parent/guardian has been advised of the potential risks and limitations of this mode of treatment (including, but not limited to, the absence of in-person examination) and has agreed to be treated using telemedicine. The patient's/patient's family's questions regarding telemedicine have been answered.     If the visit was completed in an ambulatory setting, the patient and/or parent/guardian has also been advised to contact their provider???s office for worsening conditions, and seek emergency medical treatment and/or call 911 if the patient deems either necessary.

## 2022-04-22 NOTE — Unmapped (Signed)
[facility] at [facility]     Type of visit: face to face    Are you located in [place]? (for virtual visits only)     Reason for visit: Follow up    Questions / Concerns that need to be addressed: Pt would like to discuss Iron level    PTHomeBP     Blood Sugar - 104 before breakfast        HCDM reviewed and updated in Epic:    We are working to make sure all of our patients??? wishes are updated in Epic and part of that is documenting a Environmental health practitioner for each patient  A Health Care Decision Maker is someone you choose who can make health care decisions for you if you are not able - who would you most want to do this for you????      HCDM (patient stated preference): [name],[name] - Daughter - [phone]    BPAs completed:  PHQ2    COVID-19 Vaccine Summary  Which COVID-19 Vaccine was administered  Pfizer  Type:  Dates Given:  11/14/2021     Date last COVID Positive Test:   06/03/2021             If no: Are you interested in scheduling?   Immunization History   Administered Date(s) Administered    COVID-19 VAC,BIVALENT(58YR UP)BOOST,PFIZER 11/14/2021    COVID-19 VACC,MRNA,(PFIZER)(PF) 01/19/2020, 02/09/2020, 12/12/2020    INFLUENZA QUAD ADJUVANTED 15YR UP(FLUAD) 09/17/2021    INFLUENZA QUAD HIGH DOSE 15YRS+(FLUZONE) 09/28/2016    Influenza Vaccine Quad (IIV4 PF) 65mo+ injectable 10/16/2015, 09/08/2019, 08/30/2020    Influenza Virus Vaccine, unspecified formulation 09/27/2014, 09/23/2017, 09/21/2018    PNEUMOCOCCAL POLYSACCHARIDE 23 09/28/2016    Pneumococcal Conjugate 13-Valent 08/22/2019    SHINGRIX-ZOSTER VACCINE (HZV), RECOMBINANT,SUB-UNIT,ADJUVANTED IM 04/04/2021, 09/17/2021       __________________________________________________________________________________________    SCREENINGS COMPLETED IN FLOWSHEETS    HARK Screening       AUDIT       PHQ2  PHQ-2 Total Score : 0    PHQ9          P4 Suicidality Screener                GAD7       COPD Assessment       Falls Risk

## 2022-04-22 NOTE — Unmapped (Signed)
Reports intermittent dyspnea on exertion.   Wt stable  Plan: cont current medsFollow up with cardiology

## 2022-04-23 ENCOUNTER — Ambulatory Visit: Admit: 2022-04-23 | Discharge: 2022-04-24 | Payer: MEDICARE | Attending: Nephrology | Primary: Nephrology

## 2022-04-23 DIAGNOSIS — D500 Iron deficiency anemia secondary to blood loss (chronic): Principal | ICD-10-CM

## 2022-04-23 DIAGNOSIS — N184 Chronic kidney disease, stage 4 (severe): Principal | ICD-10-CM

## 2022-04-23 LAB — CBC W/ AUTO DIFF
BASOPHILS ABSOLUTE COUNT: 0.1 10*9/L (ref 0.0–0.1)
BASOPHILS RELATIVE PERCENT: 1.3 %
EOSINOPHILS ABSOLUTE COUNT: 0.3 10*9/L (ref 0.0–0.5)
EOSINOPHILS RELATIVE PERCENT: 3.8 %
HEMATOCRIT: 35.9 % (ref 34.0–44.0)
HEMOGLOBIN: 10.8 g/dL — ABNORMAL LOW (ref 11.3–14.9)
LYMPHOCYTES ABSOLUTE COUNT: 1 10*9/L — ABNORMAL LOW (ref 1.1–3.6)
LYMPHOCYTES RELATIVE PERCENT: 12 %
MEAN CORPUSCULAR HEMOGLOBIN CONC: 30 g/dL — ABNORMAL LOW (ref 32.0–36.0)
MEAN CORPUSCULAR HEMOGLOBIN: 24.1 pg — ABNORMAL LOW (ref 25.9–32.4)
MEAN CORPUSCULAR VOLUME: 80.3 fL (ref 77.6–95.7)
MEAN PLATELET VOLUME: 8 fL (ref 6.8–10.7)
MONOCYTES ABSOLUTE COUNT: 0.8 10*9/L (ref 0.3–0.8)
MONOCYTES RELATIVE PERCENT: 9.4 %
NEUTROPHILS ABSOLUTE COUNT: 6.2 10*9/L (ref 1.8–7.8)
NEUTROPHILS RELATIVE PERCENT: 73.5 %
NUCLEATED RED BLOOD CELLS: 0 /100{WBCs} (ref <=4)
PLATELET COUNT: 225 10*9/L (ref 150–450)
RED BLOOD CELL COUNT: 4.47 10*12/L (ref 3.95–5.13)
RED CELL DISTRIBUTION WIDTH: 21.9 % — ABNORMAL HIGH (ref 12.2–15.2)
WBC ADJUSTED: 8.4 10*9/L (ref 3.6–11.2)

## 2022-04-23 LAB — FERRITIN: FERRITIN: 16.4 ng/mL

## 2022-04-23 LAB — ALBUMIN / CREATININE URINE RATIO
ALBUMIN QUANT URINE: <0.3 mg/dL
CREATININE, URINE: 55.2 mg/dL

## 2022-04-23 LAB — SLIDE REVIEW

## 2022-04-23 LAB — IRON PANEL
IRON SATURATION: 7 % — ABNORMAL LOW (ref 20–55)
IRON: 23 ug/dL — ABNORMAL LOW
TOTAL IRON BINDING CAPACITY: 329 ug/dL (ref 250–425)

## 2022-04-23 NOTE — Unmapped (Signed)
AOBP:Left  arm  medium cuff   Average:126/59  Pulse:63  1st reading:128/59  Pulse:63  2nd reading:124/58  Pulse:63     Patient labs drawn in room prior to leaving Nephrology clinic

## 2022-04-23 NOTE — Unmapped (Signed)
Hello referring Provider: Khaleed Huber, Barbara Huber*     PCP:  Barbara Doe, MD      04/23/2022      ASSESSMENT/PLAN:      [name] is a 81 y.o. year old patient with a past medical history significant for diabetes mellitus, pretension, COPD and a recent diagnosis of cardiac amyloid.  She is being seen in follow-up for chronic kidney disease.     1.  Chronic kidney disease  Stage IV.  Likely etiology combination of diabetes mellitus and hypertension.  Most recent GFR from March 2023 is 21 mL/min with a serum creatinine of 2.33 mg/dL.  It is unlikely that there is amyloid infiltration of the kidneys given the type of amyloidosis that she has.  I took this opportunity to review late stage [name] management, especially avoidance of nephrotoxins such as NSAIDs, and judicious use of any medications.  She is on a statin agent.      She is not on ACE inhibitor, however her blood pressure is well controlled, and she does not have proteinuria.     2.  Diabetes mellitus type 2  She is currently on insulin.  If her kidney function continues to improve, she may be a candidate for 1 of these agents, an SGLT2 inhibitor or GLP-1 receptor agonist.     3.  Hypertension  This appears to be controlled on the current regimen of spironolactone, and torsemide, and metolazone as needed.    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    5.  Atrial fibrillation  This appears to be stable.  She is on apixaban and amiodarone.    6.  Chronic obstructive pulmonary disease, O2 dependent  Stable.    7.  Anemia  This is likely combination of anemia of chronic kidney disease, and iron deficiency.  Hemoglobin is improved since last checked in March 2023.  Today it is 10.8, previously 8.3.  Her iron studies are low, with ferritin of 16.4, and percent saturation of 7%.  Her hemoglobin is not at a level where she would require an ESA.  However, she may benefit from IV iron.      [name] will follow up in 3 months.       At the request of her primary doctor, we will check ferritin, iron studies and CBC.  I will also check a urine albumin to creatinine ratio    I personally spent 41 minutes face-to-face and non-face-to-face in the care of this patient, which includes all pre, intra, and post visit time on the date of service.             Background: Patient has chronic kidney disease stage IV plan latest EGFR= 33ml/min and cardiac amyloid.  Etiology is not clear, but risk factors include hypertension and diabetes mellitus.  She does not have any proteinuria or albuminuria.  The lack of albuminuria can be seen in 30% of patients with diabetes and low GFR.      HPI:  [name] to the [facility] nephrology clinic for scheduled follow-up.  I last saw her about 3 months ago.  She is accompanied by her daughter today.    In the interval, she denies any hospitalizations or major medicine changes.  Overall she feels well, and is at her baseline level of functioning.   Her exercise tolerance is limited, but she denies any shortness of breath, chest pain or chest tightness.  She denies any urinary symptoms such as  dysuria, gross hematuria, incomplete voiding.      Otherwise as per ROS      ROS:   CONSTITUTIONAL: denies fevers or chills, denies unintentional weight loss  CARDIOVASCULAR: denies chest pain, denies dyspnea on exertion, denies leg edema  GASTROINTESTINAL: denies nausea, denies vomiting, denies anorexia  GENITOURINARY: denies dysuria, denies hematuria, denies decreased urinary stream  All systems reviewed and are negative except as listed above.    PAST MEDICAL HISTORY:  Past Medical History:   Diagnosis Date    Acute kidney injury superimposed on chronic kidney disease (CMS-HCC) 10/11/2020    Acute on chronic diastolic (congestive) heart failure (CMS-HCC) 08/23/2020    AKI (acute kidney injury) (CMS-HCC) 04/14/2015    Lab Results  Component  Value  Date     CREATININE  1.90 (H)  06/12/2021     Had a bump in her creatinine when she was taking her diuretics every day.  She is currently taking 40 mg daily of torsemide and 50 mg of spironolactone.  Her volume status is fragile.  Previously when she stopped her diuretic she becomes short of breath.  Plan: We will check her BMP today.  We will likely have to go to 40    Arthritis     Calculus of kidney     Calculus of ureter     CHF (congestive heart failure) (CMS-HCC)     Chronic atrial fibrillation (CMS-HCC) 07/20/2019    COPD (chronic obstructive pulmonary disease) (CMS-HCC)     Diabetes (CMS-HCC)     Gangrenous cholecystitis 10/11/2020    GERD (gastroesophageal reflux disease)     Hydronephrosis     Hypertension     Hyponatremia 10/11/2020    Intermediate coronary syndrome (CMS-HCC) 03/13/2014    Lower extremity edema 09/28/2020    Lumbar stenosis     Microscopic hematuria     Nausea alone     Nephrolithiasis 04/17/2016    Neuropathy     Nocturia     Other chronic cystitis     Pulmonary hypertension (CMS-HCC)     Renal colic     Sleep apnea     Unstable angina pectoris (CMS-HCC) 03/13/2014       ALLERGIES  Nitrofurantoin and Lipitor [atorvastatin]                 MEDICATIONS:  Current Outpatient Medications   Medication Sig Dispense Refill    ACCU-CHEK AVIVA PLUS TEST STRP Strp UESE TO CHECK BLOOD SUGAR 3 TIMES A DAY BEFORE MEALS 100 each 11    acetaminophen (TYLENOL) 500 MG tablet Take 2 tablets (1,000 mg total) by mouth daily as needed for pain.      ADVAIR HFA 115-21 mcg/actuation inhaler INHALE TWO PUFFS BY MOUTH TWICE A DAY 12 g 11    albuterol HFA 90 mcg/actuation inhaler Inhale 2 puffs every eight (8) hours as needed for wheezing.      amiodarone (PACERONE) 200 MG tablet Take 1 tablet (200 mg total) by mouth daily. 90 tablet 3    cranberry 500 mg cap Take 500 mg by mouth daily with evening meal.      estradioL (ESTRACE) 0.01 % (0.1 mg/gram) vaginal cream INSERT ONE GRAM VAGINALLY AT BEDTIME      ferrous sulfate 325 (65 FE) MG tablet Take 1 tablet (325 mg total) by mouth every other day. 45 tablet 3    fosfomycin (MONUROL) 3 gram Pack Take 3 g by mouth once a week. 36 g 3  gabapentin (NEURONTIN) 300 MG capsule Take 1 capsule (300 mg total) by mouth two (2) times a day. 180 capsule 3    insulin glargine (BASAGLAR, LANTUS) 100 unit/mL (3 mL) injection pen Inject 0.04 mL (4 Units total) under the skin nightly. 15 mL 3    lancets Misc 1 each by Miscellaneous route daily. Accu Check 100 each 6    metOLazone (ZAROXOLYN) 5 MG tablet Take 1 tablet (5 mg total) by mouth daily as needed (when instructed by cardiology clinic). 30 tablet 1    montelukast (SINGULAIR) 10 mg tablet Take 1 tablet (10 mg total) by mouth in the morning. 90 tablet 2    NARCAN 4 mg/actuation nasal spray 1 spray into alternating nostrils once as needed (opioid overdose). PRN - Emergency use.      NON FORMULARY APPLY FROM NECK DOWN TWICE A DAY      OXYCONTIN 10 mg TR12 12 hr crush resistant ER/CR tablet Take 1 tablet (10 mg total) by mouth every twelve (12) hours.      OXYGEN-AIR DELIVERY SYSTEMS MISC 5 L by Miscellaneous route. Currently using 3  L/min via [place]      pantoprazole (PROTONIX) 20 MG tablet Take 1 tablet (20 mg total) by mouth in the morning. 60 tablet 5    pen needle, diabetic (PEN NEEDLE) 31 gauge x 5/16 (8 mm) Ndle Injection Frequency is 1 time per day; Dx Code: Type 2 Diabetes uncontrolled (E11.65) 50 each 11    rosuvastatin (CRESTOR) 5 MG tablet Take 1 tablet (5 mg total) by mouth every other day. 15 tablet 11    sertraline (ZOLOFT) 25 MG tablet Take 1 tablet (25 mg total) by mouth daily. 90 tablet 3    SPIRIVA RESPIMAT 2.5 mcg/actuation inhalation mist INHALE TWO PUFFS BY MOUTH ONCE DAILY 4 g 11    spironolactone (ALDACTONE) 25 MG tablet Take 1 tablet (25 mg total) by mouth daily.      tafamidis (VYNDAMAX) 61 mg cap Take 1 capsule (61 mg) by mouth daily. 30 capsule 11    torsemide (DEMADEX) 20 MG tablet Take 2 tablets (40 mg total) by mouth daily. May also take 1 tablet (20 mg total) daily as needed (weight gain for 3lbs overnight or 5lbs in a week). 180 tablet 3    varicella-zoster [id number]-AS01B, PF, (SHINGRIX, PF,) 50 mcg/0.5 mL SusR injection Inject 0.5 mL into the muscle. 0.5 mL 1     No current facility-administered medications for this visit.       PHYSICAL EXAM:  Vitals:    04/23/22 1306   BP: 126/59   Pulse: 63   Temp: 36.6 ??C (97.9 ??F)     CONSTITUTIONAL: Alert,well appearing, no distress, seated in wheelchair, wearing oxygen  HEENT: Moist mucous membranes, oropharynx clear without erythema or exudate  EYES:  Pupils reactive, sclerae anicteric.  NECK: Supple  CARDIOVASCULAR: Regular, normal S1/S2 heart sounds, no murmurs, no rubs.   PULM: Clear to auscultation bilaterally  GASTROINTESTINAL: Soft, active bowel sounds, nontender  EXTREMITIES: No lower extremity edema bilaterally.   SKIN: No rashes or lesions  NEUROLOGIC: No focal motor or sensory deficits    MEDICAL DECISION MAKING    Component      Latest Ref Rng 02/13/2022 02/19/2022 03/13/2022   Sodium      135 - 145 mmol/L 137  141  139    Potassium      3.4 - 4.8 mmol/L 4.4  4.4  4.5    Chloride  98 - 107 mmol/L 97 (L)  101  103    CO2      20.0 - 31.0 mmol/L 34.3 (H)  31.5 (H)  27.4    Bun      9 - 23 mg/dL 66 (H)  54 (H)  59 (H)    Creatinine      0.60 - 0.80 mg/dL 1.61 (H)  0.96 (H)  0.45 (H)    Anion Gap      5 - 14 mmol/L 6  9  9     Glucose      70 - 179 mg/dL 409  811  914 (H)    Calcium      8.7 - 10.4 mg/dL 9.0  8.9  9.1    eGFR [name]-EPI (2021) Female      >=60 mL/min/1.55m2 14 (L)  17 (L)  21 (L)       (L) Low  (H) High

## 2022-05-05 ENCOUNTER — Ambulatory Visit: Admit: 2022-05-05 | Discharge: 2022-05-06 | Payer: MEDICARE

## 2022-05-05 MED ADMIN — iron sucrose (VENOFER) 200 mg in sodium chloride (NS) 0.9 % 100 mL IVPB: 200 mg | INTRAVENOUS | @ 19:00:00 | Stop: 2022-05-05

## 2022-05-05 NOTE — Unmapped (Signed)
Pt presents for Venofer infusion, VSS.  IV placed in RAC.  Pt aware of potential reaction/side effects, call bell within reach.  1507 Venofer 200 mg started  1523 Infusion complete.  Pt tolerated without complication, VSS. IV flushed per policy and d/c'd, gauze and coban applied.  Pt left clinic in no acute distress.

## 2022-05-07 ENCOUNTER — Ambulatory Visit: Admit: 2022-05-07 | Discharge: 2022-05-08 | Payer: MEDICARE

## 2022-05-07 MED ADMIN — iron sucrose (VENOFER) 200 mg in sodium chloride (NS) 0.9 % 100 mL IVPB: 200 mg | INTRAVENOUS | @ 19:00:00 | Stop: 2022-05-07

## 2022-05-07 NOTE — Unmapped (Signed)
Pt presents for Venofer infusion, VSS.  IV placed in LAC.  Pt aware of potential reaction/side effects, call bell within reach.  1503 Venofer 200 mg started  1519 Infusion complete.  Pt tolerated without complication, VSS. IV flushed per policy and d/c'd, gauze and coban applied.  Pt left clinic in no acute distress.

## 2022-05-07 NOTE — Unmapped (Signed)
[facility] Refill Coordination Note    Specialty Medication(s) to be Shipped:   General Specialty: Vyndamax    Other medication(s) to be shipped: No additional medications requested for fill at this time     Barbara Huber, DOB: 12/25/1941  Phone: [phone] (home)       All above HIPAA information was verified with patient.     Was a Nurse, learning disability used for this call? No    Completed refill call assessment today to schedule patient's medication shipment from the [facility] [phone]).  All relevant notes have been reviewed.     Specialty medication(s) and dose(s) confirmed: Regimen is correct and unchanged.   Changes to medications: Olinda reports no changes at this time.  Changes to insurance: No  New side effects reported not previously addressed with a pharmacist or physician: None reported  Questions for the pharmacist: No    Confirmed patient received a Conservation officer, historic buildings and a Surveyor, mining with first shipment. The patient will receive a drug information handout for each medication shipped and additional FDA Medication Guides as required.       DISEASE/MEDICATION-SPECIFIC INFORMATION        N/A    SPECIALTY MEDICATION ADHERENCE     Medication Adherence    Patient reported X missed doses in the last month: 0  Specialty Medication: Vyndamax 61mg   Patient is on additional specialty medications: No  Patient is on more than two specialty medications: No              Were doses missed due to medication being on hold? No    Vyndamax 61 mg: 10-12 days of medicine on hand       REFERRAL TO PHARMACIST     Referral to the pharmacist: Not needed      [facility]     Shipping address confirmed in Epic.     Delivery Scheduled: Yes, Expected medication delivery date: 05/14/22.     Medication will be delivered via Next Day Courier to the prescription address in Epic WAM.    Nancy Nordmann [facility] Specialty Technician

## 2022-05-11 ENCOUNTER — Ambulatory Visit: Admit: 2022-05-11 | Discharge: 2022-05-12 | Payer: MEDICARE

## 2022-05-11 MED ADMIN — iron sucrose (VENOFER) 200 mg in sodium chloride (NS) 0.9 % 100 mL IVPB: 200 mg | INTRAVENOUS | @ 18:00:00 | Stop: 2022-05-11

## 2022-05-11 NOTE — Unmapped (Signed)
Pt presents for Venofer infusion, VSS.  IV placed in RAC.  Pt aware of potential reaction/side effects, call bell within reach.  1422 Venofer 200 mg started  1437 Infusion complete.  Pt tolerated without complication, VSS. IV flushed per policy and d/c'd, gauze and coban applied.  Pt left clinic in no acute distress.

## 2022-05-13 ENCOUNTER — Ambulatory Visit: Admit: 2022-05-13 | Discharge: 2022-05-13 | Payer: MEDICARE

## 2022-05-13 ENCOUNTER — Ambulatory Visit: Admit: 2022-05-13 | Discharge: 2022-05-13 | Payer: MEDICARE | Attending: Adult Health | Primary: Adult Health

## 2022-05-13 DIAGNOSIS — I10 Essential (primary) hypertension: Principal | ICD-10-CM

## 2022-05-13 DIAGNOSIS — E1122 Type 2 diabetes mellitus with diabetic chronic kidney disease: Principal | ICD-10-CM

## 2022-05-13 DIAGNOSIS — N1832 Type 2 diabetes mellitus with stage 3b chronic kidney disease, without long-term current use of insulin (CMS-HCC): Principal | ICD-10-CM

## 2022-05-13 DIAGNOSIS — N189 Chronic kidney disease, unspecified: Principal | ICD-10-CM

## 2022-05-13 DIAGNOSIS — R944 Abnormal results of kidney function studies: Principal | ICD-10-CM

## 2022-05-13 DIAGNOSIS — E854 Organ-limited amyloidosis: Principal | ICD-10-CM

## 2022-05-13 DIAGNOSIS — I43 Cardiomyopathy in diseases classified elsewhere: Principal | ICD-10-CM

## 2022-05-13 DIAGNOSIS — J42 Unspecified chronic bronchitis: Principal | ICD-10-CM

## 2022-05-13 DIAGNOSIS — D631 Anemia in chronic kidney disease: Principal | ICD-10-CM

## 2022-05-13 DIAGNOSIS — I5032 Chronic diastolic (congestive) heart failure: Principal | ICD-10-CM

## 2022-05-13 LAB — CBC
HEMATOCRIT: 38.7 % (ref 34.0–44.0)
HEMOGLOBIN: 11.7 g/dL (ref 11.3–14.9)
MEAN CORPUSCULAR HEMOGLOBIN CONC: 30.3 g/dL — ABNORMAL LOW (ref 32.0–36.0)
MEAN CORPUSCULAR HEMOGLOBIN: 24.9 pg — ABNORMAL LOW (ref 25.9–32.4)
MEAN CORPUSCULAR VOLUME: 82.1 fL (ref 77.6–95.7)
MEAN PLATELET VOLUME: 9.2 fL (ref 6.8–10.7)
PLATELET COUNT: 178 10*9/L (ref 150–450)
RED BLOOD CELL COUNT: 4.72 10*12/L (ref 3.95–5.13)
RED CELL DISTRIBUTION WIDTH: 21.4 % — ABNORMAL HIGH (ref 12.2–15.2)
WBC ADJUSTED: 6.6 10*9/L (ref 3.6–11.2)

## 2022-05-13 LAB — URINALYSIS WITH MICROSCOPY WITH CULTURE REFLEX
BILIRUBIN UA: NEGATIVE
GLUCOSE UA: NEGATIVE
KETONES UA: NEGATIVE
NITRITE UA: NEGATIVE
PH UA: 5.5 (ref 5.0–9.0)
PROTEIN UA: NEGATIVE
RBC UA: 21 /HPF — ABNORMAL HIGH (ref 0–3)
SPECIFIC GRAVITY UA: 1.015 (ref 1.005–1.030)
SQUAMOUS EPITHELIAL: 11 /HPF — ABNORMAL HIGH (ref 0–5)
UROBILINOGEN UA: 0.2
WBC UA: 40 /HPF — ABNORMAL HIGH (ref 0–3)

## 2022-05-13 LAB — BASIC METABOLIC PANEL
ANION GAP: 9 mmol/L (ref 5–14)
BLOOD UREA NITROGEN: 66 mg/dL — ABNORMAL HIGH (ref 9–23)
BUN / CREAT RATIO: 26
CALCIUM: 9.1 mg/dL (ref 8.7–10.4)
CHLORIDE: 101 mmol/L (ref 98–107)
CO2: 31.2 mmol/L — ABNORMAL HIGH (ref 20.0–31.0)
CREATININE: 2.55 mg/dL — ABNORMAL HIGH
EGFR CKD-EPI (2021) FEMALE: 19 mL/min/{1.73_m2} — ABNORMAL LOW (ref >=60)
GLUCOSE RANDOM: 102 mg/dL (ref 70–179)
SODIUM: 141 mmol/L (ref 135–145)

## 2022-05-13 LAB — HEPATIC FUNCTION PANEL
ALBUMIN: 4 g/dL (ref 3.4–5.0)
ALKALINE PHOSPHATASE: 79 U/L (ref 46–116)
ALT (SGPT): 9 U/L — ABNORMAL LOW (ref 10–49)
BILIRUBIN DIRECT: 0.1 mg/dL (ref 0.00–0.30)
BILIRUBIN TOTAL: 0.4 mg/dL (ref 0.3–1.2)
PROTEIN TOTAL: 7.8 g/dL (ref 5.7–8.2)

## 2022-05-13 LAB — MAGNESIUM: MAGNESIUM: 3.3 mg/dL — ABNORMAL HIGH (ref 1.6–2.6)

## 2022-05-13 LAB — B-TYPE NATRIURETIC PEPTIDE: B-TYPE NATRIURETIC PEPTIDE: 200.15 pg/mL — ABNORMAL HIGH (ref <=100)

## 2022-05-13 LAB — TSH: THYROID STIMULATING HORMONE: 2.14 u[IU]/mL (ref 0.550–4.780)

## 2022-05-13 MED ADMIN — iron sucrose (VENOFER) 200 mg in sodium chloride (NS) 0.9 % 100 mL IVPB: 200 mg | INTRAVENOUS | @ 19:00:00 | Stop: 2022-05-13

## 2022-05-13 MED FILL — VYNDAMAX 61 MG CAPSULE: ORAL | 30 days supply | Qty: 30 | Fill #4

## 2022-05-13 NOTE — Unmapped (Signed)
Today,    MEDICATIONS:  NO medication changes today.    Call if you have questions about your medications.    LABS:  We will call you if your labs need attention.    NEXT APPOINTMENT:  Return to clinic in 3 month with me      In general, to take care of your heart failure:  -Limit your fluid intake to 2 Liters (half-gallon) per day.    -Limit your salt intake to ideally 2-3 grams (2000-3000 mg) per day.  -Weigh yourself daily and record, and bring that weight diary to your next appointment.  (Weight gain of 2-3 pounds in 1 day typically means fluid weight.)    The medications for your heart are to help your heart and help you live longer.    Please contact us before stopping any of your heart medications.    Call the clinic at [phone] with questions.  Our clinic fax number is [phone].  If you need to reschedule future appointments, please call [phone] or [phone]  My office number (c/o De Burrs RN) is [phone] if you need further assistance.  After office hours, if you have urgent questions/problems, contact the on-call cardiologist through the hospital operator: [phone].    Please do not send a MyChart message for potentially life-threatening symptoms.  Please call 911 for a true medical emergency.    To learn more about heart failure, please read [employer]'s Learning to Live with Heart Failure:  [email or web address].    Available online at   [email or web address] - then click the link Learning to Live with Heart Failure patient booklet  OR  [email or web address] - open the window for Medical Management and click the link Living with Heart Failure

## 2022-05-13 NOTE — Unmapped (Signed)
Patient here accompanied by daughter in a wheelchair and on O2.  Assisted into infusion chair.  No recent cold or flu. No change in medications or allergies.  1520  IV started, good blood return lab specimens obtained, NS flushed, line secured.  1520  Venofer 200 mg in 100 ml began infusing over 15 minutes.   Call bell with in reach.  1532  Venofer completed infusing without any difficulties.  VS stable.  NS flushed.  1543  IV d/c'd with cath intact, discharge to home with daughter.

## 2022-05-13 NOTE — Unmapped (Unsigned)
[facility] HF Amyloid Clinic Note    Referring Provider: None Per Patient Referring  [address]  [place],  [place] [address]   Primary Provider: Jacquiline Doe, MD  [address]  [place] [address]   Other Providers:  Dr Luretha Murphy    Reason for Visit:  Barbara Huber is a 81 y.o. female being seen for routine visit and continued care of cardiac amyloidosis .    Assessment & Plan:  1. 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   - Moderately increased wall thickness with low voltage ECG - she's had issues with hypotension on ARB and BB, as well as atrial arrhythmias. SPEP/UPEP/free light chains without concern. 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 She has one daughter who accompanies her and is informed.   - DC'd Jardiance 10mg  daily due to recurrent UTIs  - She appears euvolemic on exam today on torsemide 40mg  daily with an extra 20mg  PRN for weight gain of 3lbs overnight or 5lbs in a week.   - Continue Tafamidis 61 mg daily   - Continue spironolactone 25 mg daily   - She is not interested in CardioMEMS at this time  1b. AKI  - Cr improved today to ***, K ***    2. Persistent atrial fibrillation  - CHA2DS2-VASc score = 5 (1-gender, 2-Age, 1-DM, 1-HTN)  - We stopped Eliquis given recurrent anemia, shared decision making w/ pt and daughter. She is not interested in [place] (or any procedures) at this time  - She had been in afib since at least 07/2020; now s/p cardioversion 01/10/21 and continues to be in NSR since  - On amiodarone 200mg  daily - increased it back herself after seeing no improvement in tremors/jerking with decreased dose  - amiodarone monitoring - last PFTs in 04/2021. Due for TSH and LFTs today. EKG next visit.    3. COPD  - home O2 3L [place] - followed by pulm  - on Advair and Spiriva    4. Iron deficiency anemia  - Receiving IV Iron - can recheck iron panel at follow up    5. Anxiety  - Tolerating Zoloft 25mg  with improved moved/anxiety    Follow-up:  Return in about 3 months (around 08/13/2022) for Return HF.     History of Present Illness:  Barbara Huber is a 81 y.o. female with PMHx of COPD on 3L home , CHF, [name], [name], [name] She was initially admitted with decompensated [name] in 2021 in setting of  Septic Shock d/t Gangrenous Cholecystitis s/p cholecystectomy. She underwent further evaluation with PYP scan which showed TTR amyloid. She has since been followed in clinic for management of her [name]. In 2022, had numerous admissions for weakness/urosepsis, shortness of breath/orthopnea/weight gain, lightheadedness/SOB/Blurry vision found to be anemic.    Summary of most recent visits:  - 01/16/22 [facility]): Feeling well from a cardiac standpoint. Started Zoloft for anxiety  - 02/13/22 [facility]): Weight 210-211. Feeling well. Taking extra torsemide 20mg  about every other day, which was stopped for elevated Cr.   - 03/13/22 ([name]): Weight 201-216, took metolazone PRN, weight stabilized at 210lbs. Considering cardioMEMS.     Interval history  Today she presents for follow up. She has had 3 iron infusions and had some discolored urine  afterward the two this past week. At first she thought it was blood in her urine, but now thinks it was from the IV iron. It cleared up on its own. No symptoms of UTI. She is doing well fluid wise. Her weight at home is 211-212lb. She has not needed the metolazone, but has taken extra torsemide 20mg  for weight gain.  No dizziness or lightheadedness. She is not swelling. Her breathing is good for the most part, but she does sometimes get SOB.  She is cooking in the kitchen, moving around the house well, washing the dishes, doing laundry, taking a shower. She has gotten out into the garden to look at her roses, walks in the yard with a walker. Overall feels like she is doing really well.     Cardiovascular History & Procedures:    Cath / PCI:  ?? none    CV Surgery:  ??  none    EP Procedures and Devices:  ?? none    Non-Invasive Evaluation(s):    Echo:  12/09/21  Summary    1. The left ventricle is normal in size with severely increased wall  thickness.    2. The left ventricular systolic function is hyperdynamic, LVEF is visually  estimated at >70%.    3. There is grade III diastolic dysfunction (severely elevated filling  pressure).    4. The left atrium is moderately to severely dilated in size.    5. The right ventricle is mildly dilated in size, with normal systolic  function.    6. The right atrium is mildly dilated  in size.    7. IVC size and inspiratory change suggest elevated right atrial pressure.  (10-20 mmHg).    04/24/21   1. The left ventricle is normal in size with mildly increased wall  thickness.    2. The left ventricular systolic function is normal, LVEF is visually  estimated at > 55%.    3. There is grade II diastolic dysfunction (elevated filling pressure).    4. Mitral annular calcification is present (mild).    5. The left atrium is mildly dilated in size.    6. The right ventricle is mildly dilated in size, with low normal systolic  function.    7. IVC size and inspiratory change suggest elevated right atrial pressure.  (10-20 mmHg).    ?? 09/30/20  Summary    1. Limited study to assess systolic function.    2. IVC size and inspiratory change suggest normal right atrial pressure.  (0-5 mmHg).    3. The left ventricle is normal in size with mildly to moderately increased  wall thickness.    4. The left ventricular systolic function is normal with no obvious wall  motion abnormalities, LVEF is visually estimated at > 55%.    5. Mitral annular calcification is present.    6. The left atrium is mildly dilated in size.    7. The right ventricle is upper normal in size, with reduced systolic  Function.    Cardiac CT/MRI/Nuclear Tests:  ??  None    6 Minute Stress Tests:   None               Other Past Medical History:  See below for the complete EPIC list of past medical and surgical history.      Allergies:  Nitrofurantoin and Lipitor [atorvastatin]    Current Medications:  Current Outpatient Medications   Medication Sig Dispense Refill    ACCU-CHEK AVIVA  PLUS TEST STRP Strp UESE TO CHECK BLOOD SUGAR 3 TIMES A DAY BEFORE MEALS 100 each 11    acetaminophen (TYLENOL) 500 MG tablet Take 2 tablets (1,000 mg total) by mouth daily as needed for pain.      ADVAIR HFA 115-21 mcg/actuation inhaler INHALE TWO PUFFS BY MOUTH TWICE A DAY 12 g 11    albuterol HFA 90 mcg/actuation inhaler Inhale 2 puffs every eight (8) hours as needed for wheezing.      amiodarone (PACERONE) 200 MG tablet Take 1 tablet (200 mg total) by mouth daily. 90 tablet 3    cranberry 500 mg cap Take 500 mg by mouth daily with evening meal.      estradioL (ESTRACE) 0.01 % (0.1 mg/gram) vaginal cream INSERT ONE GRAM VAGINALLY AT BEDTIME      ferrous sulfate 325 (65 FE) MG tablet Take 1 tablet (325 mg total) by mouth every other day. 45 tablet 3    fosfomycin (MONUROL) 3 gram Pack Take 3 g by mouth once a week. 36 g 3    gabapentin (NEURONTIN) 300 MG capsule Take 1 capsule (300 mg total) by mouth two (2) times a day. 180 capsule 3    insulin glargine (BASAGLAR, LANTUS) 100 unit/mL (3 mL) injection pen Inject 0.04 mL (4 Units total) under the skin nightly. 15 mL 3    lancets Misc 1 each by Miscellaneous route daily. Accu Check 100 each 6    montelukast (SINGULAIR) 10 mg tablet Take 1 tablet (10 mg total) by mouth in the morning. 90 tablet 2    NARCAN 4 mg/actuation nasal spray 1 spray into alternating nostrils once as needed (opioid overdose). PRN - Emergency use.      NON FORMULARY APPLY FROM NECK DOWN TWICE A DAY      OXYCONTIN 10 mg TR12 12 hr crush resistant ER/CR tablet Take 1 tablet (10 mg total) by mouth every twelve (12) hours.      OXYGEN-AIR DELIVERY SYSTEMS MISC 5 L by Miscellaneous route. Currently using 3  L/min via [place]      pantoprazole (PROTONIX) 20 MG tablet Take 1 tablet (20 mg total) by mouth in the morning. 60 tablet 5    pen needle, diabetic (PEN NEEDLE) 31 gauge x 5/16 (8 mm) Ndle Injection Frequency is 1 time per day; Dx Code: Type 2 Diabetes uncontrolled (E11.65) 50 each 11    rosuvastatin (CRESTOR) 5 MG tablet Take 1 tablet (5 mg total) by mouth every other day. 15 tablet 11    sertraline (ZOLOFT) 25 MG tablet Take 1 tablet (25 mg total) by mouth daily. 90 tablet 3    SPIRIVA RESPIMAT 2.5 mcg/actuation inhalation mist INHALE TWO PUFFS BY MOUTH ONCE DAILY 4 g 11    spironolactone (ALDACTONE) 25 MG tablet Take 1 tablet (25 mg total) by mouth daily.      tafamidis (VYNDAMAX) 61 mg cap Take 1 capsule (61 mg) by mouth daily. 30 capsule 11    torsemide (DEMADEX) 20 MG tablet Take 2 tablets (40 mg total) by mouth daily. May also take 1 tablet (20 mg total) daily as needed (weight gain for 3lbs overnight or 5lbs in a week). 180 tablet 3    varicella-zoster gE-AS01B, PF, (SHINGRIX, PF,) 50 mcg/0.5 mL SusR injection Inject 0.5 mL into the muscle. 0.5 mL 1    metOLazone (ZAROXOLYN) 5 MG tablet Take 1 tablet (5 mg total) by mouth daily as needed (when instructed by cardiology clinic). 30 tablet 1  No current facility-administered medications for this visit.       Family History:  The patient's family history includes Cancer in her father; Hypertension in her mother.    Social history:  She  reports that she quit smoking about 4 years ago. Her smoking use included cigarettes. She smoked an average of .33 packs per day. She has never used smokeless tobacco. She reports that she does not drink alcohol and does not use drugs.    Review of Systems:  As per HPI.  Rest of the review of ten systems is negative or unremarkable except as stated above.    Physical Exam:  VITAL SIGNS:   Vitals:    05/13/22 1337   BP: 95/52   Pulse: 58   SpO2: 95%        Wt Readings from Last 3 Encounters:   05/13/22 96.2 kg (212 lb) 04/23/22 96.2 kg (212 lb)   03/13/22 95.3 kg (210 lb)      Today's Body mass index is 35.28 kg/m??.   Height: 165.1 cm (5' 5)  CONSTITUTIONAL: chronically ill-appearing in no acute distress  EYES: Conjunctivae and sclerae clear and anicteric.  ENT: Benign.   CARDIOVASCULAR: JVP not seen above the clavicle with HOB at 90 degrees. Rate and rhythm are regular.  There is no lifts or heaves.  Normal S1, S2. There is no murmur, gallops or rubs.  Radial and pedal pulses are 2+, bilaterally.   There is no pedal edema, bilaterally.   RESPIRATORY: Normal respiratory effort. Clear to auscultation bilaterally..  There are no wheezes.  GASTROINTESTINAL: Soft, non-tender, with audible bowel sounds. Abdomen nondistended.  Liver is nonpalpable.  SKIN: No rashes, ecchymosis or petechiae.  Warm, well perfused.   MUSCULOSKELETAL:  no joint swelling   NEURO/PSYCH: Appropriate mood and affect. Alert and oriented to person, place, and time. No gross motor or sensory deficits evident.    Pertinent Laboratory Studies:   Office Visit on 05/13/2022   Component Date Value Ref Range Status    Color, UA 05/13/2022 Yellow   Final    Clarity, UA 05/13/2022 Hazy   Final    Specific Gravity, UA 05/13/2022 1.015  1.005 - 1.030 Final    pH, UA 05/13/2022 5.5  5.0 - 9.0 Final    Leukocyte Esterase, UA 05/13/2022 Small (A)  Negative Final    Nitrite, UA 05/13/2022 Negative  Negative Final    Protein, UA 05/13/2022 Negative  Negative Final    Glucose, UA 05/13/2022 Negative  Negative Final    Ketones, UA 05/13/2022 Negative  Negative Final    Urobilinogen, UA 05/13/2022 0.2 mg/dL  0.2 - 2.0 mg/dL Final    Bilirubin, UA 05/13/2022 Negative  Negative Final    Blood, UA 05/13/2022 Small (A)  Negative Final    RBC, UA 05/13/2022 21 (H)  0 - 3 /HPF Final    WBC, UA 05/13/2022 40 (H)  0 - 3 /HPF Final    Squam Epithel, UA 05/13/2022 11 (H)  0 - 5 /HPF Final    Bacteria, UA 05/13/2022 Many (A)  None Seen /HPF Final    WBC Clumps 05/13/2022 Few (A)  None Seen 05/13/2022 Many (A)  None Seen /HPF Final   ??? WBC Clumps 05/13/2022 Few (A)  None Seen /HPF Final   Office Visit on 04/23/2022   Component Date Value Ref Range Status   ??? Iron 04/23/2022 23 (L)  50 - 170 ug/dL Final   ??? TIBC 16/09/9603 329  250 -  425 ug/dL Final   ??? Iron Saturation (%) 04/23/2022 7 (L)  20 - 55 % Final   ??? Ferritin 04/23/2022 16.4  7.3 - 270.7 ng/mL Final   ??? WBC 04/23/2022 8.4  3.6 - 11.2 10*9/L Final   ??? RBC 04/23/2022 4.47  3.95 - 5.13 10*12/L Final   ??? HGB 04/23/2022 10.8 (L)  11.3 - 14.9 g/dL Final   ??? HCT 16/09/9603 35.9  34.0 - 44.0 % Final   ??? MCV 04/23/2022 80.3  77.6 - 95.7 fL Final   ??? MCH 04/23/2022 24.1 (L)  25.9 - 32.4 pg Final   ??? MCHC 04/23/2022 30.0 (L)  32.0 - 36.0 g/dL Final   ??? RDW 54/08/8118 21.9 (H)  12.2 - 15.2 % Final   ??? MPV 04/23/2022 8.0  6.8 - 10.7 fL Final   ??? Platelet 04/23/2022 225  150 - 450 10*9/L Final   ??? nRBC 04/23/2022 0  <=4 /100 WBCs Final   ??? Neutrophils % 04/23/2022 73.5  % Final   ??? Lymphocytes % 04/23/2022 12.0  % Final   ??? Monocytes % 04/23/2022 9.4  % Final   ??? Eosinophils % 04/23/2022 3.8  % Final   ??? Basophils % 04/23/2022 1.3  % Final   ??? Absolute Neutrophils 04/23/2022 6.2  1.8 - 7.8 10*9/L Final   ??? Absolute Lymphocytes 04/23/2022 1.0 (L)  1.1 - 3.6 10*9/L Final   ??? Absolute Monocytes 04/23/2022 0.8  0.3 - 0.8 10*9/L Final   ??? Absolute Eosinophils 04/23/2022 0.3  0.0 - 0.5 10*9/L Final   ??? Absolute Basophils 04/23/2022 0.1  0.0 - 0.1 10*9/L Final   ??? Anisocytosis 04/23/2022 Marked (A)  Not Present Final   ??? Creat U 04/23/2022 55.2  Undefined mg/dL Final   ??? Albumin Quantitative, Urine 04/23/2022 <0.3  Undefined mg/dL Final   ??? Albumin/Creatinine Ratio 04/23/2022    Final   ??? Smear Review Comments 04/23/2022 See Comment (A)  Undefined Final   ??? Ovalocytes 04/23/2022 Moderate (A)  Not Present Final   ??? Burr Cells 04/23/2022 Present (A)  Not Present Final   ??? Poikilocytosis 04/23/2022 Moderate (A)  Not Present Final   Office Visit on 03/13/2022 mmol/L Final    Potassium 03/13/2022 4.5  3.4 - 4.8 mmol/L Final    Chloride 03/13/2022 103  98 - 107 mmol/L Final    CO2 03/13/2022 27.4  20.0 - 31.0 mmol/L Final    Anion Gap 03/13/2022 9  5 - 14 mmol/L Final    BUN 03/13/2022 59 (H)  9 - 23 mg/dL Final    Creatinine 14/78/2956 2.33 (H)  0.60 - 0.80 mg/dL Final    BUN/Creatinine Ratio 03/13/2022 25   Final    eGFR [name]-EPI (2021) Female 03/13/2022 21 (L)  >=60 mL/min/1.54m2 Final    Glucose 03/13/2022 188 (H)  70 - 179 mg/dL Final    Calcium 21/30/8657 9.1  8.7 - 10.4 mg/dL Final    Magnesium 84/69/6295 3.0 (H)  1.6 - 2.6 mg/dL Final    WBC 28/41/3244 9.0  3.6 - 11.2 10*9/L Final    RBC 03/13/2022 3.63 (L)  3.95 - 5.13 10*12/L Final    HGB 03/13/2022 8.3 (L)  11.3 - 14.9 g/dL Final    HCT 12/30/7251 27.5 (L)  34.0 - 44.0 % Final    MCV 03/13/2022 75.8 (L)  77.6 - 95.7 fL Final    MCH 03/13/2022 22.9 (L)  25.9 - 32.4 pg Final    MCHC 03/13/2022 30.2 (  L)  32.0 - 36.0 g/dL Final    RDW 16/09/9603 18.0 (H)  12.2 - 15.2 % Final    MPV 03/13/2022 8.1  6.8 - 10.7 fL Final    Platelet 03/13/2022 266  150 - 450 10*9/L Final    Hemoglobin A1C 03/13/2022 5.6  4.8 - 5.6 % Final    Estimated Average Glucose 03/13/2022 114  mg/dL Final   Office Visit on 02/19/2022   Component Date Value Ref Range Status    Sodium 02/19/2022 141  135 - 145 mmol/L Final    Potassium 02/19/2022 4.4  3.4 - 4.8 mmol/L Final    Chloride 02/19/2022 101  98 - 107 mmol/L Final    CO2 02/19/2022 31.5 (H)  20.0 - 31.0 mmol/L Final    Anion Gap 02/19/2022 9  5 - 14 mmol/L Final    BUN 02/19/2022 54 (H)  9 - 23 mg/dL Final    Creatinine 54/08/8118 2.71 (H)  0.60 - 0.80 mg/dL Final    BUN/Creatinine Ratio 02/19/2022 20   Final    eGFR [name]-EPI (2021) Female 02/19/2022 17 (L)  >=60 mL/min/1.19m2 Final    Glucose 02/19/2022 149  70 - 179 mg/dL Final    Calcium 14/78/2956 8.9  8.7 - 10.4 mg/dL Final   Office Visit on 02/13/2022   Component Date Value Ref Range Status    Sodium 02/13/2022 137  135 - 145 mmol/L Final    Potassium 02/13/2022 4.4  3.4 - 4.8 mmol/L Final    Chloride 02/13/2022 97 (L)  98 - 107 mmol/L Final    CO2 02/13/2022 34.3 (H)  20.0 - 31.0 mmol/L Final    Anion Gap 02/13/2022 6  5 - 14 mmol/L Final    BUN 02/13/2022 66 (H)  9 - 23 mg/dL Final    Creatinine 21/30/8657 3.20 (H)  0.60 - 0.80 mg/dL Final    BUN/Creatinine Ratio 02/13/2022 21   Final    eGFR [name]-EPI (2021) Female 02/13/2022 14 (L)  >=60 mL/min/1.52m2 Final    Glucose 02/13/2022 142  70 - 179 mg/dL Final    Calcium 84/69/6295 9.0  8.7 - 10.4 mg/dL Final    BNP 28/41/3244 294.18 (H)  <=100 pg/mL Final    Magnesium 02/13/2022 3.7 (H)  1.6 - 2.6 mg/dL Final    Iron 12/30/7251 31 (L)  50 - 170 ug/dL Final    TIBC 66/44/0347 345  250 - 425 ug/dL Final    Iron Saturation (%) 02/13/2022 9 (L)  20 - 55 % Final    Ferritin 02/13/2022 13.9  7.3 - 270.7 ng/mL Final    WBC 02/13/2022 8.0  3.6 - 11.2 10*9/L Final    RBC 02/13/2022 3.77 (L)  3.95 - 5.13 10*12/L Final    HGB 02/13/2022 9.4 (L)  11.3 - 14.9 g/dL Final    HCT 42/59/5638 30.2 (L)  34.0 - 44.0 % Final    MCV 02/13/2022 80.2  77.6 - 95.7 fL Final    MCH 02/13/2022 24.8 (L)  25.9 - 32.4 pg Final    MCHC 02/13/2022 31.0 (L)  32.0 - 36.0 g/dL Final    RDW 75/64/3329 19.3 (H)  12.2 - 15.2 % Final    MPV 02/13/2022 7.9  6.8 - 10.7 fL Final    Platelet 02/13/2022 275  150 - 450 10*9/L Final   Office Visit on 01/22/2022   Component Date Value Ref Range Status    Spec Gravity/POC 01/22/2022 1.010  1.003 - 1.030 Final  PH/POC 01/22/2022 5.5  5.0 - 9.0 Final    Leuk Esterase/POC 01/22/2022 Negative  Negative Final    Nitrite/POC 01/22/2022 Negative  Negative Final    Protein/POC 01/22/2022 Negative  Negative Final    UA Glucose/POC 01/22/2022 Negative  Negative Final    Ketones, POC 01/22/2022 Negative  Negative Final    Bilirubin/POC 01/22/2022 Negative  Negative Final    Blood/POC 01/22/2022 Negative  Negative Final    Urobilinogen/POC 01/22/2022 0.2  0.2 - 1.0 mg/dL Final       Lab Results Component Value Date    PRO-BNP 3,420.0 (H) 07/03/2021    PRO-BNP 905.0 (H) 03/21/2021    PRO-BNP 3,796.0 (H) 12/23/2020    Creatinine 2.33 (H) 03/13/2022    Creatinine 2.71 (H) 02/19/2022    Creatinine 0.79 01/06/2011    BUN 59 (H) 03/13/2022    BUN 54 (H) 02/19/2022    BUN 20 01/06/2011    Sodium 139 03/13/2022    Sodium 140 01/06/2011    Potassium 4.5 03/13/2022    Potassium 4.1 01/06/2011    CO2 27.4 03/13/2022    CO2 30 01/06/2011    Magnesium 3.0 (H) 03/13/2022    Magnesium 2.0 01/06/2011    Total Bilirubin 0.5 12/09/2021    INR 2.15 07/01/2021    INR 1.0 01/06/2011       Lab Results   Component Value Date    Digoxin Level 0.9 10/01/2020       Lab Results   Component Value Date    TSH 2.731 12/06/2021    Cholesterol 95 12/06/2021    Triglycerides 66 12/06/2021    HDL 42 12/06/2021    Non-HDL Cholesterol 53 (L) 12/06/2021    LDL Calculated 40 12/06/2021       Lab Results   Component Value Date    WBC 8.4 04/23/2022    WBC 12.5 (H) 01/06/2011    HGB 10.8 (L) 04/23/2022    HGB 12.9 01/06/2011    HCT 35.9 04/23/2022    HCT 40.8 01/06/2011    Platelet 225 04/23/2022    Platelet 260 01/06/2011       Pertinent Test Results from Today:  None    Other pertinent records were reviewed.    The following are further history from the patient's EPIC record for reference:     Past Medical History:   Diagnosis Date    Acute kidney injury superimposed on chronic kidney disease (CMS-HCC) 10/11/2020    Acute on chronic diastolic (congestive) heart failure (CMS-HCC) 08/23/2020    AKI (acute kidney injury) (CMS-HCC) 04/14/2015    Lab Results  Component  Value  Date     CREATININE  1.90 (H)  06/12/2021     Had a bump in her creatinine when she was taking her diuretics every day.  She is currently taking 40 mg daily of torsemide and 50 mg of spironolactone.  Her volume status is fragile.  Previously when she stopped her diuretic she becomes short of breath.  Plan: We will check her BMP today.  We will likely have to go to 40 Arthritis     Calculus of kidney     Calculus of ureter     CHF (congestive heart failure) (CMS-HCC)     Chronic atrial fibrillation (CMS-HCC) 07/20/2019    COPD (chronic obstructive pulmonary disease) (CMS-HCC)     Diabetes (CMS-HCC)     Gangrenous cholecystitis 10/11/2020    GERD (gastroesophageal reflux disease)     Hydronephrosis  Hypertension     Hyponatremia 10/11/2020    Intermediate coronary syndrome (CMS-HCC) 03/13/2014    Lower extremity edema 09/28/2020    Lumbar stenosis     Microscopic hematuria     Nausea alone     Nephrolithiasis 04/17/2016    Neuropathy     Nocturia     Other chronic cystitis     Pulmonary hypertension (CMS-HCC)     Renal colic     Sleep apnea     Unstable angina pectoris (CMS-HCC) 03/13/2014       Past Surgical History:   Procedure Laterality Date    BACK SURGERY  1995    CARPAL TUNNEL RELEASE Left 2014    HYSTERECTOMY  1971    IR INSERT CHOLECYSTOSMY TUBE PERCUTANEOUS  10/02/2020    IR INSERT CHOLECYSTOSMY TUBE PERCUTANEOUS 10/02/2020 Braulio Conte, MD IMG VIR [facility]    LUMBAR DISC SURGERY      PR REMOVAL GALLBLADDER N/A 10/06/2020    Procedure: CHOLECYSTECTOMY;  Surgeon: Katherina Mires, MD;  Location: MAIN OR [facility];  Service: Trauma    PR RIGHT HEART CATH O2 SATURATION & CARDIAC OUTPUT N/A 09/30/2020    Procedure: Right Heart Catheterization;  Surgeon: Neal Dy, MD;  Location: [facility] CATH;  Service: Cardiology    PR RIGHT HEART CATH O2 SATURATION & CARDIAC OUTPUT N/A 01/09/2021    Procedure: Right Heart Catheterization;  Surgeon: Lesle Reek, MD;  Location: [facility] CATH;  Service: Cardiology

## 2022-05-15 ENCOUNTER — Ambulatory Visit: Admit: 2022-05-15 | Discharge: 2022-05-16 | Payer: MEDICARE

## 2022-05-15 MED ADMIN — iron sucrose (VENOFER) 200 mg in sodium chloride (NS) 0.9 % 100 mL IVPB: 200 mg | INTRAVENOUS | @ 19:00:00 | Stop: 2022-05-15

## 2022-05-15 NOTE — Unmapped (Signed)
Pt presents for #5/5 Venofer infusion, denies any recent fever, illnesses, infections. VSS. IV placed to right AC.     1458 Venofer 200mg  started to infuse.    1517 Venofer infusion stopped. Pt tolerated without ill effects. VSS. Pt in no acute distress. IV d/c'ed, gauze and coban applied. Pt d/c'ed from infusion center.

## 2022-05-21 ENCOUNTER — Emergency Department
Admit: 2022-05-21 | Discharge: 2022-05-22 | Disposition: A | Discharge status: Home / Self Care | Payer: MEDICARE | Attending: Emergency Medicine

## 2022-05-21 ENCOUNTER — Ambulatory Visit
Admit: 2022-05-21 | Discharge: 2022-05-22 | Disposition: A | Discharge status: Home / Self Care | Payer: MEDICARE | Attending: Emergency Medicine

## 2022-05-21 LAB — URINALYSIS WITH MICROSCOPY WITH CULTURE REFLEX
BILIRUBIN UA: NEGATIVE
BLOOD UA: NEGATIVE
GLUCOSE UA: NEGATIVE
HYALINE CASTS: 7 /LPF — ABNORMAL HIGH (ref 0–1)
KETONES UA: NEGATIVE
NITRITE UA: NEGATIVE
PH UA: 6.5 (ref 5.0–9.0)
PROTEIN UA: NEGATIVE
RBC UA: 1 /HPF (ref <=4)
SPECIFIC GRAVITY UA: 1.009 (ref 1.003–1.030)
SQUAMOUS EPITHELIAL: 1 /HPF (ref 0–5)
UROBILINOGEN UA: <2
WBC UA: 10 /HPF — ABNORMAL HIGH (ref 0–5)

## 2022-05-21 LAB — CBC W/ AUTO DIFF
HEMATOCRIT: 37.8 % (ref 34.0–44.0)
HEMOGLOBIN: 11.7 g/dL (ref 11.3–14.9)
MEAN CORPUSCULAR HEMOGLOBIN CONC: 31 g/dL — ABNORMAL LOW (ref 32.0–36.0)
MEAN CORPUSCULAR HEMOGLOBIN: 25.9 pg (ref 25.9–32.4)
MEAN CORPUSCULAR VOLUME: 83.6 fL (ref 77.6–95.7)
MEAN PLATELET VOLUME: 9.6 fL (ref 6.8–10.7)
PLATELET COUNT: 179 10*9/L (ref 150–450)
RED BLOOD CELL COUNT: 4.52 10*12/L (ref 3.95–5.13)
RED CELL DISTRIBUTION WIDTH: 22.5 % — ABNORMAL HIGH (ref 12.2–15.2)
WBC ADJUSTED: 8 10*9/L (ref 3.6–11.2)

## 2022-05-21 LAB — COMPREHENSIVE METABOLIC PANEL
ALBUMIN: 4 g/dL (ref 3.4–5.0)
ALKALINE PHOSPHATASE: 83 U/L (ref 46–116)
ALT (SGPT): <7 U/L — ABNORMAL LOW (ref 10–49)
ANION GAP: 7 mmol/L (ref 5–14)
AST (SGOT): 17 U/L (ref <=34)
BILIRUBIN TOTAL: 0.3 mg/dL (ref 0.3–1.2)
BLOOD UREA NITROGEN: 52 mg/dL — ABNORMAL HIGH (ref 9–23)
BUN / CREAT RATIO: 18
CALCIUM: 8.9 mg/dL (ref 8.7–10.4)
CHLORIDE: 106 mmol/L (ref 98–107)
CO2: 25.7 mmol/L (ref 20.0–31.0)
CREATININE: 2.84 mg/dL — ABNORMAL HIGH
EGFR CKD-EPI (2021) FEMALE: 16 mL/min/{1.73_m2} — ABNORMAL LOW (ref >=60)
GLUCOSE RANDOM: 145 mg/dL (ref 70–179)
POTASSIUM: 5 mmol/L — ABNORMAL HIGH (ref 3.4–4.8)
PROTEIN TOTAL: 7.6 g/dL (ref 5.7–8.2)
SODIUM: 139 mmol/L (ref 135–145)

## 2022-05-21 LAB — SLIDE REVIEW

## 2022-05-22 ENCOUNTER — Ambulatory Visit: Admit: 2022-05-22 | Discharge: 2022-05-23 | Payer: MEDICARE | Attending: Adult Health | Primary: Adult Health

## 2022-05-22 DIAGNOSIS — I43 Cardiomyopathy in diseases classified elsewhere: Principal | ICD-10-CM

## 2022-05-22 DIAGNOSIS — I272 Pulmonary hypertension, unspecified: Principal | ICD-10-CM

## 2022-05-22 DIAGNOSIS — I10 Essential (primary) hypertension: Principal | ICD-10-CM

## 2022-05-22 DIAGNOSIS — E854 Organ-limited amyloidosis: Principal | ICD-10-CM

## 2022-05-22 DIAGNOSIS — I4819 Other persistent atrial fibrillation: Principal | ICD-10-CM

## 2022-05-22 DIAGNOSIS — N184 Chronic kidney disease, stage 4 (severe): Principal | ICD-10-CM

## 2022-05-22 DIAGNOSIS — I5032 Chronic diastolic (congestive) heart failure: Principal | ICD-10-CM

## 2022-05-22 LAB — HIGH SENSITIVITY TROPONIN I - SINGLE: HIGH SENSITIVITY TROPONIN I: 46 ng/L (ref <=34)

## 2022-05-22 LAB — HIGH SENSITIVITY TROPONIN I - 2H/6H SERIAL
HIGH SENSITIVITY TROPONIN - DELTA (0-2H): 7 ng/L (ref <=7)
HIGH-SENSITIVITY TROPONIN I - 2 HOUR: 53 ng/L (ref <=34)

## 2022-05-22 NOTE — Unmapped (Shared)
***Refresh DATE OF SERVICE***    Family Medicine Inpatient Service  History and Physical Note    Team: {FMISTeams:28080}  PCP: Jacquiline Doe, MD  Date of Admission: May 22, 2022  Code Status: {code status:20168}  Emergency Contact: ***    ASSESSMENT / PLAN:   Barbara Huber is a 81 y.o. female with a past medical history significant for *** who presents with ***    # ***  -     # ***  -      # Tobacco use disorder: Patient currently smoking ***PPD. Will place inpatient consult to tobacco cessation for toxic effects of tobacco with hospital diagnosis*** and possible nicotine withdrawal.  - Tobacco cessation consult  - Nicotine replacement therapy with patch, gum, lozenge PRN    # FEN/GI:  - IVF {IVFMAB:28083}  - Check electrolytes as indicated, replete as needed.  - Diet {dietmab:28082}    # PPX:   - DVT: {FMISDVTPROPHYLAXIS:67009}    # Dispo: {DISPO:28085}  [ ]  Anticipated Discharge Location: {ANTICIPATED ZOXWRUEAV:40981}  [ ]  PT/OT/DME: {XBJYNWG:95621}  [ ]  CM/SW needs: {HYQMVH:84696}  [ ]  Meds/Rx:  {EXBMW:41324}. {MWNUUV:25366}  [ ]  Teaching: {TDLTEACHING:39514}  [ ]  Follow up appt: {FMISFOLLOWUP:67728}  [ ]  Excuse letter: {TDLEXCUSE:39515}  [ ]  Transport: {TDLTRANSPORT1:39529} {TDLTRANSPORT2:39573}    HISTORY OF PRESENT ILLNESS:  Barbara Huber is a 81 y.o. female who presents with SOB and increased O2 requirement.     ED Course: Pt arrived on 4 L of O2 satting 88%. First troponin came back elevated 46, second troponin ordered because of left sided chest pain came back 53. Chest x-ray showed cardiomegaly and pulmonary edema with small pleural effusions. Bedside POCUS did not show fluid collection. EKG showed sinus arrhythmia with 1st degree AV block.  High HEART score. Shared decision making, pt wanted to be admitted.  Cr 2.84 on arrival. Pt returned to needing 3L, sat 95%, well-appearing.     PAST MEDICAL / SURGICAL HX:  Past Medical History:   Diagnosis Date    Acute kidney injury superimposed on chronic kidney disease (CMS-HCC) 10/11/2020    Acute on chronic diastolic (congestive) heart failure (CMS-HCC) 08/23/2020    AKI (acute kidney injury) (CMS-HCC) 04/14/2015    Lab Results  Component  Value  Date     CREATININE  1.90 (H)  06/12/2021     Had a bump in her creatinine when she was taking her diuretics every day.  She is currently taking 40 mg daily of torsemide and 50 mg of spironolactone.  Her volume status is fragile.  Previously when she stopped her diuretic she becomes short of breath.  Plan: We will check her BMP today.  We will likely have to go to 40    Arthritis     Calculus of kidney     Calculus of ureter     CHF (congestive heart failure) (CMS-HCC)     Chronic atrial fibrillation (CMS-HCC) 07/20/2019    COPD (chronic obstructive pulmonary disease) (CMS-HCC)     Diabetes (CMS-HCC)     Gangrenous cholecystitis 10/11/2020    GERD (gastroesophageal reflux disease)     Hydronephrosis     Hypertension     Hyponatremia 10/11/2020    Intermediate coronary syndrome (CMS-HCC) 03/13/2014    Lower extremity edema 09/28/2020    Lumbar stenosis     Microscopic hematuria     Nausea alone     Nephrolithiasis 04/17/2016    Neuropathy     Nocturia     Other  chronic cystitis     Pulmonary hypertension (CMS-HCC)     Renal colic     Sleep apnea     Unstable angina pectoris (CMS-HCC) 03/13/2014     Past Surgical History:   Procedure Laterality Date    BACK SURGERY  1995    CARPAL TUNNEL RELEASE Left 2014    HYSTERECTOMY  1971    IR INSERT CHOLECYSTOSMY TUBE PERCUTANEOUS  10/02/2020    IR INSERT CHOLECYSTOSMY TUBE PERCUTANEOUS 10/02/2020 Braulio Conte, MD IMG VIR [facility]    LUMBAR DISC SURGERY      PR REMOVAL GALLBLADDER N/A 10/06/2020    Procedure: CHOLECYSTECTOMY;  Surgeon: Katherina Mires, MD;  Location: MAIN OR [facility];  Service: Trauma    PR RIGHT HEART CATH O2 SATURATION & CARDIAC OUTPUT N/A 09/30/2020    Procedure: Right Heart Catheterization;  Surgeon: Neal Dy, MD;  Location: [facility] CATH;  Service: Cardiology    PR RIGHT HEART CATH O2 SATURATION & CARDIAC OUTPUT N/A 01/09/2021    Procedure: Right Heart Catheterization;  Surgeon: Lesle Reek, MD;  Location: [facility] CATH;  Service: Cardiology       FAMILY HX: *** (must be updated in the history tab, then erase the red text)  Family History   Problem Relation Age of Onset    Hypertension Mother     Cancer Father         COLON CANCER    Anesthesia problems Neg Hx     Broken bones Neg Hx     Clotting disorder Neg Hx     Collagen disease Neg Hx     Diabetes Neg Hx     Dislocations Neg Hx     Fibromyalgia Neg Hx     Gout Neg Hx     Hemophilia Neg Hx     Osteoporosis Neg Hx     Rheumatologic disease Neg Hx     Scoliosis Neg Hx     Severe sprains Neg Hx     Sickle cell anemia Neg Hx     Spinal Compression Fracture Neg Hx     GU problems Neg Hx     Kidney cancer Neg Hx     Prostate cancer Neg Hx        SOCIAL HX: *** (must be updated in the history tab, then erase the red text)  Social History     Socioeconomic History    Marital status: Widowed   Occupational History    Occupation: retired   Tobacco Use    Smoking status: Former     Packs/day: 0.33     Types: Cigarettes     Quit date: 2019     Years since quitting: 4.4    Smokeless tobacco: Never    Tobacco comments:     Quit a few years ago   Vaping Use    Vaping Use: Never used   Substance and Sexual Activity    Alcohol use: No    Drug use: No   Other Topics Concern    Exercise No   Social History Narrative    Previously worked in Designer, fashion/clothing, retired.  Daughter is Economist who works at [employer].  Her niece is Selena Batten, [employer] Anesthesia Tech.  Lives in [place] with daughter and son-in-law.     Social Determinants of Health     Financial Resource Strain: Low Risk     Difficulty of Paying Living Expenses: Not hard at all   Food Insecurity: No Food  Insecurity    Worried About Programme researcher, broadcasting/film/video in the Last Year: Never true    Ran Out of Food in the Last Year: Never true   Transportation Needs: No Transportation Needs    Lack of Transportation (Medical): No    Lack of Transportation (Non-Medical): No       MEDICATIONS / ALLERGIES:  (Not in a hospital admission)      Allergies   Allergen Reactions    Nitrofurantoin Anaphylaxis and Other (See Comments)    Lipitor [Atorvastatin] Muscle Pain     SOB, Headache, fatigue, sick on my stomach       IMMUNIZATIONS:  Immunization History   Administered Date(s) Administered    COVID-19 VAC,BIVALENT(88YR UP),PFIZER 11/14/2021    COVID-19 VACC,MRNA,(PFIZER)(PF) 01/19/2020, 02/09/2020, 12/12/2020    INFLUENZA QUAD ADJUVANTED 37YR UP(FLUAD) 09/17/2021    INFLUENZA QUAD HIGH DOSE 37YRS+(FLUZONE) 09/28/2016    Influenza Vaccine Quad (IIV4 PF) 24mo+ injectable 10/16/2015, 09/08/2019, 08/30/2020    Influenza Virus Vaccine, unspecified formulation 09/27/2014, 09/23/2017, 09/21/2018    PNEUMOCOCCAL POLYSACCHARIDE 23 09/28/2016    Pneumococcal Conjugate 13-Valent 08/22/2019    SHINGRIX-ZOSTER VACCINE (HZV), RECOMBINANT,SUB-UNIT,ADJUVANTED IM 04/04/2021, 09/17/2021       REVIEW OF SYSTEMS:  {FMISROS:67637::Pertinent positives and negatives per HPI. A complete review of systems otherwise negative.}    PHYSICAL EXAM:    Initial ED Vitals:   ED Triage Vitals [05/21/22 1948]   Enc Vitals Group      BP 116/59      Heart Rate 60      SpO2 Pulse       Resp 22      Temp 37.1 ??C (98.7 ??F)      Temp Source Temporal      SpO2 95 %      Weight 97.5 kg (215 lb)      Height 1.651 m (5' 5)      Head Circumference       Peak Flow       Pain Score       Pain Loc       Pain Edu?       Excl. in GC?        Recent Vitals:  Vitals:    05/21/22 2343   BP: 126/60   Pulse: 57   Resp: 20   Temp:    SpO2: 95%       GEN: Well-appearing, lying in bed, NAD ***  Eyes: PERRL. No scleral icterus. Conjunctiva non-erythematous. EOMI.  HEENT: NCAT, MMM. Oropharynx clear.  Neck: Supple.  Lymphadenopathy: No cervical or supraclavicular LAD.  CV: Regular rate and rhythm. No murmurs/rubs/gallops. No costochondral tenderness. No cyanosis or clubbing. Cap Refill < 2 secs (important to note for SEPSIS)***  Pulm: CTAB. No wheezing, crackles, or rhonchi.  Abd: Flat.  Nontender. No guarding, rebound.  Normoactive bowel sounds.    Neuro: A&O x 3. No focal deficits. Strength 5/5 UE/LE. Distal sensation to light touch intact.  Ext: No peripheral edema.  Palpable distal pulses.  Skin: No rashes or skin lesions.  Sacrum examined and ***      LABS/ STUDIES:  All imaging, laboratory studies, and other pertinent tests including electrocardiography were reviewed prior to admission and are summarized within the assessment and plan.     ( *** remove if not a student authored note) I attest that I have reviewed the student note and that the components of the history of the present illness, the physical exam, and the assessment and  plan documented were performed by me or were performed in my presence by the student where I verified the documentation and performed (or re-performed) the exam and medical decision making.     Vernona Rieger, {ZOXWRU:04540} {LM PGY JWJX:91478}  May 22, 2022 3:40 AM

## 2022-05-22 NOTE — Unmapped (Signed)
[facility] HF Amyloid Clinic Note    Referring Provider: Montez Hageman, AGNP  [address]  [place],  [place] [address]   Primary Provider: Jacquiline Doe, MD  [address]  [place] [address]   Other Providers:  Dr Luretha Murphy    Reason for Visit:  Barbara Huber is a 81 y.o. female being seen for routine visit and continued care of cardiac amyloidosis .    Assessment & Plan:  1. 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   - Moderately increased wall thickness with low voltage ECG - she's had issues with hypotension on ARB and BB, as well as atrial arrhythmias. SPEP/UPEP/free light chains without concern. 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 She has one daughter who accompanies her and is informed.   - DC'd Jardiance 10mg  daily due to recurrent UTIs  - Weight is up from baseline, took metolazone x1 dose yesterday. Take extra torsemide 20mg  x2 days then resume just torsemide 40mg  daily. Target weight of 211-212 on home scale.   - Continue Tafamidis 61 mg daily   - Continue spironolactone 25 mg daily   - She is not interested in CardioMEMS at this time    2. Persistent atrial fibrillation  - CHA2DS2-VASc score = 5 (1-gender, 2-Age, 1-DM, 1-HTN)  - We stopped Eliquis given recurrent anemia, shared decision making w/ pt and daughter. She is not interested in [place] (or any procedures) at this time  - She had been in afib since at least 07/2020; now s/p cardioversion 01/10/21 and continues to be in NSR since  - On amiodarone 200mg  daily - increased it back herself after seeing no improvement in tremors/jerking with decreased dose  - amiodarone monitoring - last PFTs in 04/2021. Due for TSH and LFTs today. EKG next visit.    3. [name]  - Cr staying around 2.3-2.5, 2.84 yesterday after metolazone dose    4. COPD  - home O2 3L [place] - followed by pulm  - on Advair and Spiriva    5. Iron deficiency anemia  - Finsished IV Iron - can recheck iron panel at follow up    6. Anxiety  - Tolerating Zoloft 25mg  with improved moved/anxiety    Follow-up:  Return in about 2 weeks (around 06/05/2022) for Return HF.     History of Present Illness:  Barbara Huber is a 82 y.o. female with PMHx of COPD on 3L home [place], CHF, [name], [name], [name] She was initially admitted with decompensated [name] in 2021 in setting of  Septic Shock d/t Gangrenous Cholecystitis s/p cholecystectomy. She underwent further evaluation with PYP scan which showed TTR amyloid. She has since been followed in clinic for management of her [name]. In 2022, had numerous admissions for weakness/urosepsis, shortness of breath/orthopnea/weight gain, lightheadedness/SOB/Blurry vision found to be anemic.    Summary of most recent visits:  - 01/16/22 [facility]): Feeling well from a cardiac standpoint. Started Zoloft for anxiety  - 02/13/22 [facility]): Weight 210-211. Feeling well. Taking extra torsemide 20mg  about every other day, which was stopped for elevated Cr.   - 03/13/22 ([name]): Weight 201-216, took metolazone PRN, weight stabilized at 210lbs. Considering cardioMEMS.   - 05/13/22 ([name]): Had 3 iron infusions. Doing well w/ volume, weight 211-212lbs. Cooking,  staying active around house.     Interval history  Today she presents for follow up after [name] last night for SOB and chest pain. She was having some pain in her chest/side especially with movement, and was getting SOB when she moved. She had to turn her O2 up to 4L and noted more DOE. Weight had gone up from 213 to 217lbs yesterday. She took the booster yesterday (metolazone) and an extra torsemide.  She was seen in ED, labs drawn and chest xray done. She was told they didn't have a doctor to admit her, and offered her to go home and come back that evening (tonight). She declined and just called for clinic follow up. Today she was back down to 213lbs. When she moves it hurts in her chest/side, thinks it's a strained muscle. She hasn't been able to turn O2 back down yet, but is feeling less SOB. She is just tired from having been up most of the night last night. Her daughter notes that last year after getting IV iron, she also had issues and ended up hospitalized and could never figure out a trigger. She is wondering if the IV iron has caused problems for her. She does not have any more infusions to go.     Cardiovascular History & Procedures:    Cath / PCI:  none    CV Surgery:   none    EP Procedures and Devices:  none    Non-Invasive Evaluation(s):    Echo:  12/09/21  Summary    1. The left ventricle is normal in size with severely increased wall  thickness.    2. The left ventricular systolic function is hyperdynamic, LVEF is visually  estimated at >70%.    3. There is grade III diastolic dysfunction (severely elevated filling  pressure).    4. The left atrium is moderately to severely dilated in size.    5. The right ventricle is mildly dilated in size, with normal systolic  function.    6. The right atrium is mildly dilated  in size.    7. IVC size and inspiratory change suggest elevated right atrial pressure.  (10-20 mmHg).    04/24/21   1. The left ventricle is normal in size with mildly increased wall  thickness.    2. The left ventricular systolic function is normal, LVEF is visually  estimated at > 55%.    3. There is grade II diastolic dysfunction (elevated filling pressure).    4. Mitral annular calcification is present (mild).    5. The left atrium is mildly dilated in size.    6. The right ventricle is mildly dilated in size, with low normal systolic  function.    7. IVC size and inspiratory change suggest elevated right atrial pressure.  (10-20 mmHg).    09/30/20  Summary    1. Limited study to assess systolic function.    2. IVC size and inspiratory change suggest normal right atrial pressure.  (0-5 mmHg).    3. The left ventricle is normal in size with mildly to moderately increased  wall thickness.    4. The left ventricular systolic function is normal with no obvious wall  motion abnormalities, LVEF is visually estimated at > 55%.    5. Mitral annular calcification is present.    6. The left atrium is mildly dilated in size.    7. The right ventricle is upper normal in size, with reduced systolic  Function.  Cardiac CT/MRI/Nuclear Tests:   None    6 Minute Walk:  None    Cardiopulmonary Stress Tests:   None               Other Past Medical History:  See below for the complete EPIC list of past medical and surgical history.      Allergies:  Nitrofurantoin and Lipitor [atorvastatin]    Current Medications:  Current Outpatient Medications   Medication Sig Dispense Refill    ACCU-CHEK AVIVA PLUS TEST STRP Strp UESE TO CHECK BLOOD SUGAR 3 TIMES A DAY BEFORE MEALS 100 each 11    acetaminophen (TYLENOL) 500 MG tablet Take 2 tablets (1,000 mg total) by mouth daily as needed for pain.      ADVAIR HFA 115-21 mcg/actuation inhaler INHALE TWO PUFFS BY MOUTH TWICE A DAY 12 g 11    albuterol HFA 90 mcg/actuation inhaler Inhale 2 puffs every eight (8) hours as needed for wheezing.      amiodarone (PACERONE) 200 MG tablet Take 1 tablet (200 mg total) by mouth daily. 90 tablet 3    cranberry 500 mg cap Take 500 mg by mouth daily with evening meal.      estradioL (ESTRACE) 0.01 % (0.1 mg/gram) vaginal cream INSERT ONE GRAM VAGINALLY AT BEDTIME      ferrous sulfate 325 (65 FE) MG tablet Take 1 tablet (325 mg total) by mouth every other day. 45 tablet 3    fosfomycin (MONUROL) 3 gram Pack Take 3 g by mouth once a week. 36 g 3    gabapentin (NEURONTIN) 300 MG capsule Take 1 capsule (300 mg total) by mouth two (2) times a day. 180 capsule 3    insulin glargine (BASAGLAR, LANTUS) 100 unit/mL (3 mL) injection pen Inject 0.04 mL (4 Units total) under the skin nightly. 15 mL 3    lancets Misc 1 each by Miscellaneous route daily. Accu Check 100 each 6 metOLazone (ZAROXOLYN) 5 MG tablet Take 1 tablet (5 mg total) by mouth daily as needed (when instructed by cardiology clinic). 30 tablet 1    montelukast (SINGULAIR) 10 mg tablet Take 1 tablet (10 mg total) by mouth in the morning. 90 tablet 2    NARCAN 4 mg/actuation nasal spray 1 spray into alternating nostrils once as needed (opioid overdose). PRN - Emergency use.      NON FORMULARY APPLY FROM NECK DOWN TWICE A DAY      OXYCONTIN 10 mg TR12 12 hr crush resistant ER/CR tablet Take 1 tablet (10 mg total) by mouth every twelve (12) hours.      pantoprazole (PROTONIX) 20 MG tablet Take 1 tablet (20 mg total) by mouth in the morning. 60 tablet 5    pen needle, diabetic (PEN NEEDLE) 31 gauge x 5/16 (8 mm) Ndle Injection Frequency is 1 time per day; Dx Code: Type 2 Diabetes uncontrolled (E11.65) 50 each 11    rosuvastatin (CRESTOR) 5 MG tablet Take 1 tablet (5 mg total) by mouth every other day. 15 tablet 11    sertraline (ZOLOFT) 25 MG tablet Take 1 tablet (25 mg total) by mouth daily. 90 tablet 3    SPIRIVA RESPIMAT 2.5 mcg/actuation inhalation mist INHALE TWO PUFFS BY MOUTH ONCE DAILY 4 g 11    spironolactone (ALDACTONE) 25 MG tablet Take 1 tablet (25 mg total) by mouth daily.      tafamidis (VYNDAMAX) 61 mg cap Take 1 capsule (61 mg) by mouth daily. 30 capsule 11  torsemide (DEMADEX) 20 MG tablet Take 2 tablets (40 mg total) by mouth daily. May also take 1 tablet (20 mg total) daily as needed (weight gain for 3lbs overnight or 5lbs in a week). 180 tablet 3    varicella-zoster gE-AS01B, PF, (SHINGRIX, PF,) 50 mcg/0.5 mL SusR injection Inject 0.5 mL into the muscle. 0.5 mL 1    OXYGEN-AIR DELIVERY SYSTEMS MISC 5 L by Miscellaneous route. Currently using 3  L/min via [place]       No current facility-administered medications for this visit.       Family History:  The patient's family history includes Cancer in her father; Hypertension in her mother.    Social history:  She  reports that she quit smoking about 4 years ago. Her smoking use included cigarettes. She smoked an average of .33 packs per day. She has never used smokeless tobacco. She reports that she does not drink alcohol and does not use drugs.    Review of Systems:  As per HPI.  Rest of the review of ten systems is negative or unremarkable except as stated above.    Physical Exam:  VITAL SIGNS:   Vitals:    05/22/22 1350   BP: 110/69   Pulse: 67   SpO2: 94%        Wt Readings from Last 3 Encounters:   05/22/22 97 kg (213 lb 12.8 oz)   05/21/22 97.5 kg (215 lb)   05/13/22 95.3 kg (210 lb)      Today's Body mass index is 35.58 kg/m?? (pended).      CONSTITUTIONAL: well-appearing in no acute distress  EYES: Conjunctivae and sclerae clear and anicteric.  ENT: Benign.   CARDIOVASCULAR: JVP not seen above the clavicle with HOB at 90 degrees. Rate and rhythm are regular.  There is no lifts or heaves.  Normal S1, S2. There is no murmur, gallops or rubs.  Radial and pedal pulses are 2+, bilaterally.   There is 1+ pitting edema to ankles, bilaterally.   RESPIRATORY: Decreased breath sounds at bases.  There are no wheezes.  GASTROINTESTINAL: Soft, non-tender, with audible bowel sounds. Abdomen nondistended.  Liver is nonpalpable.  SKIN: No rashes, ecchymosis or petechiae.  Warm, well perfused.   MUSCULOSKELETAL:  no joint swelling   NEURO/PSYCH: Appropriate mood and affect. Alert and oriented to person, place, and time. No gross motor or sensory deficits evident.    Pertinent Laboratory Studies:   Admission on 05/21/2022, Discharged on 05/22/2022   Component Date Value Ref Range Status    Sodium 05/21/2022 139  135 - 145 mmol/L Final    Potassium 05/21/2022 5.0 (H)  3.4 - 4.8 mmol/L Final    Chloride 05/21/2022 106  98 - 107 mmol/L Final    CO2 05/21/2022 25.7  20.0 - 31.0 mmol/L Final    Anion Gap 05/21/2022 7  5 - 14 mmol/L Final    BUN 05/21/2022 52 (H)  9 - 23 mg/dL Final    Creatinine 54/08/8118 2.84 (H)  0.60 - 0.80 mg/dL Final    BUN/Creatinine Ratio 05/21/2022 18   Final eGFR [name]-EPI (2021) Female 05/21/2022 16 (L)  >=60 mL/min/1.62m2 Final    Glucose 05/21/2022 145  70 - 179 mg/dL Final    Calcium 14/78/2956 8.9  8.7 - 10.4 mg/dL Final    Albumin 21/30/8657 4.0  3.4 - 5.0 g/dL Final    Total Protein 05/21/2022 7.6  5.7 - 8.2 g/dL Final    Total Bilirubin 05/21/2022 0.3  0.3 -  1.2 mg/dL Final    AST 16/09/9603 17  <=34 U/L Final    ALT 05/21/2022 <7 (L)  10 - 49 U/L Final    Alkaline Phosphatase 05/21/2022 83  46 - 116 U/L Final    WBC 05/21/2022 8.0  3.6 - 11.2 10*9/L Final    RBC 05/21/2022 4.52  3.95 - 5.13 10*12/L Final    HGB 05/21/2022 11.7  11.3 - 14.9 g/dL Final    HCT 54/08/8118 37.8  34.0 - 44.0 % Final    MCV 05/21/2022 83.6  77.6 - 95.7 fL Final    MCH 05/21/2022 25.9  25.9 - 32.4 pg Final    MCHC 05/21/2022 31.0 (L)  32.0 - 36.0 g/dL Final    RDW 14/78/2956 22.5 (H)  12.2 - 15.2 % Final    MPV 05/21/2022 9.6  6.8 - 10.7 fL Final    Platelet 05/21/2022 179  150 - 450 10*9/L Final    Anisocytosis 05/21/2022 Marked (A)  Not Present Final    Color, UA 05/21/2022 Colorless   Final    Clarity, UA 05/21/2022 Clear   Final    Specific Gravity, UA 05/21/2022 1.009  1.003 - 1.030 Final    pH, UA 05/21/2022 6.5  5.0 - 9.0 Final    Leukocyte Esterase, UA 05/21/2022 Small (A)  Negative Final    Nitrite, UA 05/21/2022 Negative  Negative Final    Protein, UA 05/21/2022 Negative  Negative Final    Glucose, UA 05/21/2022 Negative  Negative Final    Ketones, UA 05/21/2022 Negative  Negative Final    Urobilinogen, UA 05/21/2022 <2.0 mg/dL  <2.1 mg/dL Final    Bilirubin, UA 05/21/2022 Negative  Negative Final    Blood, UA 05/21/2022 Negative  Negative Final    RBC, UA 05/21/2022 1  <=4 /HPF Final    WBC, UA 05/21/2022 10 (H)  0 - 5 /HPF Final    Squam Epithel, UA 05/21/2022 1  0 - 5 /HPF Final    Bacteria, UA 05/21/2022 Rare (A)  None Seen /HPF Final    Hyaline Casts, UA 05/21/2022 7 (H)  0 - 1 /LPF Final    Urine Culture, Comprehensive 05/21/2022 Mixed Urogenital Flora   Final    Smear Review Comments 05/21/2022 See Comment (A)  Undefined Final    hsTroponin I 05/21/2022 46 (HH)  <=34 ng/L Final    EKG Ventricular Rate 05/22/2022 59  BPM Final    EKG Atrial Rate 05/22/2022 59  BPM Final    EKG P-R Interval 05/22/2022 218  ms Final    EKG QRS Duration 05/22/2022 96  ms Final    EKG Q-T Interval 05/22/2022 508  ms Final    EKG QTC Calculation 05/22/2022 502  ms Final    EKG Calculated P Axis 05/22/2022 61  degrees Final    EKG Calculated R Axis 05/22/2022 97  degrees Final    EKG Calculated T Axis 05/22/2022 49  degrees Final    QTC Fredericia 05/22/2022 505  ms Final    hsTroponin I 05/22/2022 53 (HH)  <=34 ng/L Final    delta hsTroponin I 05/22/2022 7  <=7 ng/L Final   Infusion on 05/13/2022   Component Date Value Ref Range Status    Sodium 05/13/2022 141  135 - 145 mmol/L Final    Potassium 05/13/2022    Final    Chloride 05/13/2022 101  98 - 107 mmol/L Final    CO2 05/13/2022 31.2 (H)  20.0 - 31.0 mmol/L  Final    Anion Gap 05/13/2022 9  5 - 14 mmol/L Final    BUN 05/13/2022 66 (H)  9 - 23 mg/dL Final    Creatinine 21/30/8657 2.55 (H)  0.60 - 0.80 mg/dL Final    BUN/Creatinine Ratio 05/13/2022 26   Final    eGFR [name]-EPI (2021) Female 05/13/2022 19 (L)  >=60 mL/min/1.30m2 Final    Glucose 05/13/2022 102  70 - 179 mg/dL Final    Calcium 84/69/6295 9.1  8.7 - 10.4 mg/dL Final    BNP 28/41/3244 200.15 (H)  <=100 pg/mL Final    Magnesium 05/13/2022 3.3 (H)  1.6 - 2.6 mg/dL Final    WBC 12/30/7251 6.6  3.6 - 11.2 10*9/L Final    RBC 05/13/2022 4.72  3.95 - 5.13 10*12/L Final    HGB 05/13/2022 11.7  11.3 - 14.9 g/dL Final    HCT 66/44/0347 38.7  34.0 - 44.0 % Final    MCV 05/13/2022 82.1  77.6 - 95.7 fL Final    MCH 05/13/2022 24.9 (L)  25.9 - 32.4 pg Final    MCHC 05/13/2022 30.3 (L)  32.0 - 36.0 g/dL Final    RDW 42/59/5638 21.4 (H)  12.2 - 15.2 % Final    MPV 05/13/2022 9.2  6.8 - 10.7 fL Final    Platelet 05/13/2022 178  150 - 450 10*9/L Final   Office Visit on 05/13/2022   Component Date Value Ref Range Status    Color, UA 05/13/2022 Yellow   Final    Clarity, UA 05/13/2022 Hazy   Final    Specific Gravity, UA 05/13/2022 1.015  1.005 - 1.030 Final    pH, UA 05/13/2022 5.5  5.0 - 9.0 Final    Leukocyte Esterase, UA 05/13/2022 Small (A)  Negative Final    Nitrite, UA 05/13/2022 Negative  Negative Final    Protein, UA 05/13/2022 Negative  Negative Final    Glucose, UA 05/13/2022 Negative  Negative Final    Ketones, UA 05/13/2022 Negative  Negative Final    Urobilinogen, UA 05/13/2022 0.2 mg/dL  0.2 - 2.0 mg/dL Final    Bilirubin, UA 05/13/2022 Negative  Negative Final    Blood, UA 05/13/2022 Small (A)  Negative Final    RBC, UA 05/13/2022 21 (H)  0 - 3 /HPF Final    WBC, UA 05/13/2022 40 (H)  0 - 3 /HPF Final    Squam Epithel, UA 05/13/2022 11 (H)  0 - 5 /HPF Final    Bacteria, UA 05/13/2022 Many (A)  None Seen /HPF Final    WBC Clumps 05/13/2022 Few (A)  None Seen /HPF Final    Urine Culture, Comprehensive 05/13/2022 >100,000 CFU/mL Klebsiella species (A)   Final    Urine Culture, Comprehensive 05/13/2022 >100,000 CFU/mL Streptococcus bovis group (A)   Final    TSH 05/13/2022 2.140  0.550 - 4.780 uIU/mL Final    Albumin 05/13/2022 4.0  3.4 - 5.0 g/dL Final    Total Protein 05/13/2022 7.8  5.7 - 8.2 g/dL Final    Total Bilirubin 05/13/2022 0.4  0.3 - 1.2 mg/dL Final    Bilirubin, Direct 05/13/2022 0.10  0.00 - 0.30 mg/dL Final    AST 75/64/3329    Final    ALT 05/13/2022 9 (L)  10 - 49 U/L Final    Alkaline Phosphatase 05/13/2022 79  46 - 116 U/L Final   Office Visit on 04/23/2022   Component Date Value Ref Range Status    Iron 04/23/2022 23 (L)  50 - 170 ug/dL Final    TIBC 16/09/9603 329  250 - 425 ug/dL Final    Iron Saturation (%) 04/23/2022 7 (L)  20 - 55 % Final    Ferritin 04/23/2022 16.4  7.3 - 270.7 ng/mL Final    WBC 04/23/2022 8.4  3.6 - 11.2 10*9/L Final    RBC 04/23/2022 4.47  3.95 - 5.13 10*12/L Final    HGB 04/23/2022 10.8 (L)  11.3 - 14.9 g/dL Final    HCT 54/08/8118 35.9  34.0 - 44.0 % Final MCV 04/23/2022 80.3  77.6 - 95.7 fL Final    MCH 04/23/2022 24.1 (L)  25.9 - 32.4 pg Final    MCHC 04/23/2022 30.0 (L)  32.0 - 36.0 g/dL Final    RDW 14/78/2956 21.9 (H)  12.2 - 15.2 % Final    MPV 04/23/2022 8.0  6.8 - 10.7 fL Final    Platelet 04/23/2022 225  150 - 450 10*9/L Final    nRBC 04/23/2022 0  <=4 /100 WBCs Final    Neutrophils % 04/23/2022 73.5  % Final    Lymphocytes % 04/23/2022 12.0  % Final    Monocytes % 04/23/2022 9.4  % Final    Eosinophils % 04/23/2022 3.8  % Final    Basophils % 04/23/2022 1.3  % Final    Absolute Neutrophils 04/23/2022 6.2  1.8 - 7.8 10*9/L Final    Absolute Lymphocytes 04/23/2022 1.0 (L)  1.1 - 3.6 10*9/L Final    Absolute Monocytes 04/23/2022 0.8  0.3 - 0.8 10*9/L Final    Absolute Eosinophils 04/23/2022 0.3  0.0 - 0.5 10*9/L Final    Absolute Basophils 04/23/2022 0.1  0.0 - 0.1 10*9/L Final    Anisocytosis 04/23/2022 Marked (A)  Not Present Final    Creat U 04/23/2022 55.2  Undefined mg/dL Final    Albumin Quantitative, Urine 04/23/2022 <0.3  Undefined mg/dL Final    Albumin/Creatinine Ratio 04/23/2022    Final    Smear Review Comments 04/23/2022 See Comment (A)  Undefined Final    Ovalocytes 04/23/2022 Moderate (A)  Not Present Final    Burr Cells 04/23/2022 Present (A)  Not Present Final    Poikilocytosis 04/23/2022 Moderate (A)  Not Present Final   Office Visit on 03/13/2022   Component Date Value Ref Range Status    BNP 03/13/2022 268.76 (H)  <=100 pg/mL Final    Sodium 03/13/2022 139  135 - 145 mmol/L Final    Potassium 03/13/2022 4.5  3.4 - 4.8 mmol/L Final    Chloride 03/13/2022 103  98 - 107 mmol/L Final    CO2 03/13/2022 27.4  20.0 - 31.0 mmol/L Final    Anion Gap 03/13/2022 9  5 - 14 mmol/L Final    BUN 03/13/2022 59 (H)  9 - 23 mg/dL Final    Creatinine 21/30/8657 2.33 (H)  0.60 - 0.80 mg/dL Final    BUN/Creatinine Ratio 03/13/2022 25   Final    eGFR [name]-EPI (2021) Female 03/13/2022 21 (L)  >=60 mL/min/1.54m2 Final    Glucose 03/13/2022 188 (H)  70 - 179 mg/dL Final    Calcium 84/69/6295 9.1  8.7 - 10.4 mg/dL Final    Magnesium 28/41/3244 3.0 (H)  1.6 - 2.6 mg/dL Final    WBC 12/30/7251 9.0  3.6 - 11.2 10*9/L Final    RBC 03/13/2022 3.63 (L)  3.95 - 5.13 10*12/L Final    HGB 03/13/2022 8.3 (L)  11.3 - 14.9 g/dL Final    HCT 66/44/0347  27.5 (L)  34.0 - 44.0 % Final    MCV 03/13/2022 75.8 (L)  77.6 - 95.7 fL Final    MCH 03/13/2022 22.9 (L)  25.9 - 32.4 pg Final    MCHC 03/13/2022 30.2 (L)  32.0 - 36.0 g/dL Final    RDW 81/19/1478 18.0 (H)  12.2 - 15.2 % Final    MPV 03/13/2022 8.1  6.8 - 10.7 fL Final    Platelet 03/13/2022 266  150 - 450 10*9/L Final    Hemoglobin A1C 03/13/2022 5.6  4.8 - 5.6 % Final    Estimated Average Glucose 03/13/2022 114  mg/dL Final   Office Visit on 02/19/2022   Component Date Value Ref Range Status    Sodium 02/19/2022 141  135 - 145 mmol/L Final    Potassium 02/19/2022 4.4  3.4 - 4.8 mmol/L Final    Chloride 02/19/2022 101  98 - 107 mmol/L Final    CO2 02/19/2022 31.5 (H)  20.0 - 31.0 mmol/L Final    Anion Gap 02/19/2022 9  5 - 14 mmol/L Final    BUN 02/19/2022 54 (H)  9 - 23 mg/dL Final    Creatinine 29/56/2130 2.71 (H)  0.60 - 0.80 mg/dL Final    BUN/Creatinine Ratio 02/19/2022 20   Final    eGFR [name]-EPI (2021) Female 02/19/2022 17 (L)  >=60 mL/min/1.48m2 Final    Glucose 02/19/2022 149  70 - 179 mg/dL Final    Calcium 86/57/8469 8.9  8.7 - 10.4 mg/dL Final   Office Visit on 02/13/2022   Component Date Value Ref Range Status    Sodium 02/13/2022 137  135 - 145 mmol/L Final    Potassium 02/13/2022 4.4  3.4 - 4.8 mmol/L Final    Chloride 02/13/2022 97 (L)  98 - 107 mmol/L Final    CO2 02/13/2022 34.3 (H)  20.0 - 31.0 mmol/L Final    Anion Gap 02/13/2022 6  5 - 14 mmol/L Final    BUN 02/13/2022 66 (H)  9 - 23 mg/dL Final    Creatinine 62/95/2841 3.20 (H)  0.60 - 0.80 mg/dL Final    BUN/Creatinine Ratio 02/13/2022 21   Final    eGFR [name]-EPI (2021) Female 02/13/2022 14 (L)  >=60 mL/min/1.31m2 Final    Glucose 02/13/2022 142  70 - 179 mg/dL Final Calcium 32/44/0102 9.0  8.7 - 10.4 mg/dL Final    BNP 72/53/6644 294.18 (H)  <=100 pg/mL Final    Magnesium 02/13/2022 3.7 (H)  1.6 - 2.6 mg/dL Final    Iron 03/47/4259 31 (L)  50 - 170 ug/dL Final    TIBC 56/38/7564 345  250 - 425 ug/dL Final    Iron Saturation (%) 02/13/2022 9 (L)  20 - 55 % Final    Ferritin 02/13/2022 13.9  7.3 - 270.7 ng/mL Final    WBC 02/13/2022 8.0  3.6 - 11.2 10*9/L Final    RBC 02/13/2022 3.77 (L)  3.95 - 5.13 10*12/L Final    HGB 02/13/2022 9.4 (L)  11.3 - 14.9 g/dL Final    HCT 33/29/5188 30.2 (L)  34.0 - 44.0 % Final    MCV 02/13/2022 80.2  77.6 - 95.7 fL Final    MCH 02/13/2022 24.8 (L)  25.9 - 32.4 pg Final    MCHC 02/13/2022 31.0 (L)  32.0 - 36.0 g/dL Final    RDW 41/66/0630 19.3 (H)  12.2 - 15.2 % Final    MPV 02/13/2022 7.9  6.8 - 10.7 fL Final    Platelet  02/13/2022 275  150 - 450 10*9/L Final       Lab Results   Component Value Date    PRO-BNP 3,420.0 (H) 07/03/2021    PRO-BNP 905.0 (H) 03/21/2021    PRO-BNP 3,796.0 (H) 12/23/2020    Creatinine 2.84 (H) 05/21/2022    Creatinine 2.55 (H) 05/13/2022    Creatinine 0.79 01/06/2011    BUN 52 (H) 05/21/2022    BUN 66 (H) 05/13/2022    BUN 20 01/06/2011    Sodium 139 05/21/2022    Sodium 140 01/06/2011    Potassium 5.0 (H) 05/21/2022    Potassium 4.1 01/06/2011    CO2 25.7 05/21/2022    CO2 30 01/06/2011    Magnesium 3.3 (H) 05/13/2022    Magnesium 2.0 01/06/2011    Total Bilirubin 0.3 05/21/2022    INR 2.15 07/01/2021    INR 1.0 01/06/2011       Lab Results   Component Value Date    Digoxin Level 0.9 10/01/2020       Lab Results   Component Value Date    TSH 2.140 05/13/2022    Cholesterol 95 12/06/2021    Triglycerides 66 12/06/2021    HDL 42 12/06/2021    Non-HDL Cholesterol 53 (L) 12/06/2021    LDL Calculated 40 12/06/2021       Lab Results   Component Value Date    WBC 8.0 05/21/2022    WBC 12.5 (H) 01/06/2011    HGB 11.7 05/21/2022    HGB 12.9 01/06/2011    HCT 37.8 05/21/2022    HCT 40.8 01/06/2011    Platelet 179 05/21/2022 Platelet 260 01/06/2011       Pertinent Test Results from Today:  None    Other pertinent records were reviewed.    The following are further history from the patient's EPIC record for reference:     Past Medical History:   Diagnosis Date    Acute kidney injury superimposed on chronic kidney disease (CMS-HCC) 10/11/2020    Acute on chronic diastolic (congestive) heart failure (CMS-HCC) 08/23/2020    AKI (acute kidney injury) (CMS-HCC) 04/14/2015    Lab Results  Component  Value  Date     CREATININE  1.90 (H)  06/12/2021     Had a bump in her creatinine when she was taking her diuretics every day.  She is currently taking 40 mg daily of torsemide and 50 mg of spironolactone.  Her volume status is fragile.  Previously when she stopped her diuretic she becomes short of breath.  Plan: We will check her BMP today.  We will likely have to go to 40    Arthritis     Calculus of kidney     Calculus of ureter     CHF (congestive heart failure) (CMS-HCC)     Chronic atrial fibrillation (CMS-HCC) 07/20/2019    COPD (chronic obstructive pulmonary disease) (CMS-HCC)     Diabetes (CMS-HCC)     Gangrenous cholecystitis 10/11/2020    GERD (gastroesophageal reflux disease)     Hydronephrosis     Hypertension     Hyponatremia 10/11/2020    Intermediate coronary syndrome (CMS-HCC) 03/13/2014    Lower extremity edema 09/28/2020    Lumbar stenosis     Microscopic hematuria     Nausea alone     Nephrolithiasis 04/17/2016    Neuropathy     Nocturia     Other chronic cystitis     Pulmonary hypertension (CMS-HCC)     Renal colic  Sleep apnea     Unstable angina pectoris (CMS-HCC) 03/13/2014       Past Surgical History:   Procedure Laterality Date    BACK SURGERY  1995    CARPAL TUNNEL RELEASE Left 2014    HYSTERECTOMY  1971    IR INSERT CHOLECYSTOSMY TUBE PERCUTANEOUS  10/02/2020    IR INSERT CHOLECYSTOSMY TUBE PERCUTANEOUS 10/02/2020 Braulio Conte, MD IMG VIR H&V [facility]    LUMBAR DISC SURGERY      PR REMOVAL GALLBLADDER N/A 10/06/2020    Procedure: CHOLECYSTECTOMY;  Surgeon: Katherina Mires, MD;  Location: MAIN OR [facility];  Service: Trauma    PR RIGHT HEART CATH O2 SATURATION & CARDIAC OUTPUT N/A 09/30/2020    Procedure: Right Heart Catheterization;  Surgeon: Neal Dy, MD;  Location: [facility] CATH;  Service: Cardiology    PR RIGHT HEART CATH O2 SATURATION & CARDIAC OUTPUT N/A 01/09/2021    Procedure: Right Heart Catheterization;  Surgeon: Lesle Reek, MD;  Location: [facility] CATH;  Service: Cardiology

## 2022-05-22 NOTE — Unmapped (Signed)
[facility]  Emergency Department Provider Note     ED Clinical Impression     Final diagnoses:   Elevated troponin (Primary)   Acute on chronic congestive heart failure, unspecified heart failure type (CMS-HCC)      Impression, Medical Decision Making, ED Course     Impression: 81 y.o. female who has a past medical history of Acute kidney injury superimposed on chronic kidney disease (CMS-HCC) (10/11/2020), Acute on chronic diastolic (congestive) heart failure (CMS-HCC) (08/23/2020), AKI (acute kidney injury) (CMS-HCC) (04/14/2015), Arthritis, Calculus of kidney, Calculus of ureter, CHF (congestive heart failure) (CMS-HCC), Chronic atrial fibrillation (CMS-HCC) (07/20/2019), COPD (chronic obstructive pulmonary disease) (CMS-HCC), Diabetes (CMS-HCC), Gangrenous cholecystitis (10/11/2020), GERD (gastroesophageal reflux disease), Hydronephrosis, Hypertension, Hyponatremia (10/11/2020), Intermediate coronary syndrome (CMS-HCC) (03/13/2014), Lower extremity edema (09/28/2020), Lumbar stenosis, Microscopic hematuria, Nausea alone, Nephrolithiasis (04/17/2016), Neuropathy, Nocturia, Other chronic cystitis, Pulmonary hypertension (CMS-HCC), Renal colic, Sleep apnea, and Unstable angina pectoris (CMS-HCC) (03/13/2014). who presents with acute onset of dyspnea and central chest discomfort that radiates to her left back and worsens with movement, as described below.     On exam, patient is well-appearing and in no acute distress. The patients vitals are within normal limits. Exam was remarkable for diminished bilateral breaths in the lower lobe with no wheezes or crackles. Good S1 and S2 without a murmur. No bilateral edema was noted.     DDx/MDM: Differential includes acute hypoxia versus CHF exacerbation.    CHF Exacerbation, possible over diuresis  This patient with known prior medical history of CHF currently on 40 of Lasix twice daily presents the emergency department for evaluation of acute hypoxia with increase in oxygen requirement at home from 3 L via nasal cannula to 4, as well as decrease in her oxygen saturation by pulse oximeter at home. On results review of the patient's work-up in the ED, chest x-ray on my independent interpretation shows areas of pleural effusions in the bilateral lower lobes with some mild cardiomegaly as well, overall changed from prior chest x-ray done in December.  Patient does endorse a recent 2 pound weight gain and thought that she may have not had enough diuresis. She states that in addition to her Lasix she gave herself metolazone. Overall, patient does not appear acutely fluid overload and does not have increased work of breathing or tachypnea which the patient agrees with. She states her only concern was the pulse oximeter reading at home, but otherwise states she feels at her baseline normal.    Acute on chronic hypoxia  Patient's oxygen requirement here in the emergency department is at baseline 3 L [place]. She is satting 95% on 3 L. With her history of COPD and CHF, patient's goal SpO2 should be 88% which she is well above    Elevated troponin  Patient with first troponin of 46 and second of 53 here in the ED. On review of prior labs patient has previously had troponins in the 100s. Given this, I do not believe that patient is having a myocardial infarction, with EKG unchanged from prior, but that this is rather demand ischemia, improved from prior.    I have had a shared decision making conversation with the patient regarding her work up, her overall appearance, and reassurances. Initially patient had considered admission versus discharge home with follow up. Patient and daughter are fine with either option, I will reach out to [facility] for bed availability.     Diagnostic workup as below.     Orders Placed This Encounter  Procedures   ??? Urine Culture   ??? XR Chest 2 views   ??? CBC w/ Differential   ??? Comprehensive Metabolic Panel   ??? Urinalysis with Microscopy with Culture Reflex   ??? hsTroponin I (single, no delta)   ??? hsTroponin I (serial 2-6H CONTINUATION w/ delta)   ??? hsTroponin I - 6 Hour   ??? ECG 12 Lead   ??? ED Admit Decision       ED Course as of 05/22/22 0444   Fri May 22, 2022   1610 I reached out to the [facility] to discuss admission for the patient.  Both family medicine service and medic services had no availability to take on the patient and that the patient would not be able to be admitted until later this afternoon after discharges took place.  I discussed this with the patient and had another shared decision-making conversation.  She agrees that she would prefer to go home if all that would be done for her was to trend her troponin.  She did states that she has a follow-up appointment with her pulmonologist in the next week as well as with her PCP who she will speak to her early this morning.  I discussed strict return precautions with the patient and her daughter and they verbalized agreement and understanding.  We will plan for discharge at this time.       Independent Interpretation of Studies: I have independently interpreted the following studies:  ??? Chest XR (as above)    Discussion of Management With Other Providers or Support Staff: I discussed the management of this patient with the:  ??? Family Medicine Resident Deetta Perla and [facility]    Considerations Regarding Disposition/Escalation of Care and Critical Care:  ??? Given the patient's reassuring appearance and workup, her close follow up, and understanding of strict return precautions, I believe she is appropriate for outpatient management.   ____________________________________________    The case was discussed with the attending physician, who is in agreement with the above assessment and plan.      History     Chief Complaint  Chief Complaint   Patient presents with   ??? Shortness of Breath       HPI   Barbara Huber is a 81 y.o. female with past medical history of hypertension, hyponatremia, hydronephrosis, [name], [name] (on 40 mg lasix), T2DM with neuropathy, CHF (on torsemide), and COPD (on 3L home [place]) as below who presents with shortness of breath. The patient reports an acute onset of dyspnea this afternoon that worsens upon exertion. As a result, she went from 3 liters to 4 liters of oxygen today as she was satting 88-90%. Additionally, the patient reports pins and needle pain on the left side of her chest that radiates to her left back, which comes on when moving. Patient's daughter at bedside reports patient had a low-grade fever (Tmax 99.8 F) and chills 4 days ago after receiving an iron transfusion 5 days ago. She denies any history of adverse reactions to her iron transfusions. Additionally patient mentions a recent weight gain of 2 pounds over night last night. Patient took one extra dose of lasix pill this morning. She reports use of nebulizer treatments and endorses compliance with daily medications. She denies any new medications or exposure to novel environment. Denies heart palpitations, productive cough, wheezing, chest pain.     Outside Historian(s): None    External Records Reviewed: Prior medical charts and records    Past Medical History:  Diagnosis Date   ??? Acute kidney injury superimposed on chronic kidney disease (CMS-HCC) 10/11/2020   ??? Acute on chronic diastolic (congestive) heart failure (CMS-HCC) 08/23/2020   ??? AKI (acute kidney injury) (CMS-HCC) 04/14/2015    Lab Results  Component  Value  Date     CREATININE  1.90 (H)  06/12/2021     Had a bump in her creatinine when she was taking her diuretics every day.  She is currently taking 40 mg daily of torsemide and 50 mg of spironolactone.  Her volume status is fragile.  Previously when she stopped her diuretic she becomes short of breath.  Plan: We will check her BMP today.  We will likely have to go to 40   ??? Arthritis    ??? Calculus of kidney    ??? Calculus of ureter    ??? CHF (congestive heart failure) (CMS-HCC)    ??? Chronic atrial fibrillation (CMS-HCC) 07/20/2019   ??? COPD (chronic obstructive pulmonary disease) (CMS-HCC)    ??? Diabetes (CMS-HCC)    ??? Gangrenous cholecystitis 10/11/2020   ??? GERD (gastroesophageal reflux disease)    ??? Hydronephrosis    ??? Hypertension    ??? Hyponatremia 10/11/2020   ??? Intermediate coronary syndrome (CMS-HCC) 03/13/2014   ??? Lower extremity edema 09/28/2020   ??? Lumbar stenosis    ??? Microscopic hematuria    ??? Nausea alone    ??? Nephrolithiasis 04/17/2016   ??? Neuropathy    ??? Nocturia    ??? Other chronic cystitis    ??? Pulmonary hypertension (CMS-HCC)    ??? Renal colic    ??? Sleep apnea    ??? Unstable angina pectoris (CMS-HCC) 03/13/2014       Past Surgical History:   Procedure Laterality Date   ??? BACK SURGERY  1995   ??? CARPAL TUNNEL RELEASE Left 2014   ??? HYSTERECTOMY  1971   ??? IR INSERT CHOLECYSTOSMY TUBE PERCUTANEOUS  10/02/2020    IR INSERT CHOLECYSTOSMY TUBE PERCUTANEOUS 10/02/2020 Braulio Conte, MD IMG VIR [facility]   ??? LUMBAR DISC SURGERY     ??? PR REMOVAL GALLBLADDER N/A 10/06/2020    Procedure: CHOLECYSTECTOMY;  Surgeon: Katherina Mires, MD;  Location: MAIN OR [facility];  Service: Trauma   ??? PR RIGHT HEART CATH O2 SATURATION & CARDIAC OUTPUT N/A 09/30/2020    Procedure: Right Heart Catheterization;  Surgeon: Neal Dy, MD;  Location: [facility] CATH;  Service: Cardiology   ??? PR RIGHT HEART CATH O2 SATURATION & CARDIAC OUTPUT N/A 01/09/2021    Procedure: Right Heart Catheterization;  Surgeon: Lesle Reek, MD;  Location: [facility] CATH;  Service: Cardiology       No current facility-administered medications for this encounter.    Current Outpatient Medications:   ???  ACCU-CHEK AVIVA PLUS TEST STRP Strp, UESE TO CHECK BLOOD SUGAR 3 TIMES A DAY BEFORE MEALS, Disp: 100 each, Rfl: 11  ???  acetaminophen (TYLENOL) 500 MG tablet, Take 2 tablets (1,000 mg total) by mouth daily as needed for pain., Disp: , Rfl:   ???  ADVAIR HFA 115-21 mcg/actuation inhaler, INHALE TWO PUFFS BY MOUTH TWICE A DAY, Disp: 12 g, Rfl: 11  ???  albuterol HFA 90 mcg/actuation inhaler, Inhale 2 puffs every eight (8) hours as needed for wheezing., Disp: , Rfl:   ???  amiodarone (PACERONE) 200 MG tablet, Take 1 tablet (200 mg total) by mouth daily., Disp: 90 tablet, Rfl: 3  ???  cranberry 500 mg cap, Take 500 mg by  mouth daily with evening meal., Disp: , Rfl:   ???  estradioL (ESTRACE) 0.01 % (0.1 mg/gram) vaginal cream, INSERT ONE GRAM VAGINALLY AT BEDTIME, Disp: , Rfl:   ???  ferrous sulfate 325 (65 FE) MG tablet, Take 1 tablet (325 mg total) by mouth every other day., Disp: 45 tablet, Rfl: 3  ???  fosfomycin (MONUROL) 3 gram Pack, Take 3 g by mouth once a week., Disp: 36 g, Rfl: 3  ???  gabapentin (NEURONTIN) 300 MG capsule, Take 1 capsule (300 mg total) by mouth two (2) times a day., Disp: 180 capsule, Rfl: 3  ???  insulin glargine (BASAGLAR, LANTUS) 100 unit/mL (3 mL) injection pen, Inject 0.04 mL (4 Units total) under the skin nightly., Disp: 15 mL, Rfl: 3  ???  lancets Misc, 1 each by Miscellaneous route daily. Accu Check, Disp: 100 each, Rfl: 6  ???  metOLazone (ZAROXOLYN) 5 MG tablet, Take 1 tablet (5 mg total) by mouth daily as needed (when instructed by cardiology clinic)., Disp: 30 tablet, Rfl: 1  ???  montelukast (SINGULAIR) 10 mg tablet, Take 1 tablet (10 mg total) by mouth in the morning., Disp: 90 tablet, Rfl: 2  ???  NARCAN 4 mg/actuation nasal spray, 1 spray into alternating nostrils once as needed (opioid overdose). PRN - Emergency use., Disp: , Rfl:   ???  NON FORMULARY, APPLY FROM NECK DOWN TWICE A DAY, Disp: , Rfl:   ???  OXYCONTIN 10 mg TR12 12 hr crush resistant ER/CR tablet, Take 1 tablet (10 mg total) by mouth every twelve (12) hours., Disp: , Rfl:   ???  OXYGEN-AIR DELIVERY SYSTEMS MISC, 5 L by Miscellaneous route. Currently using 3  L/min via [place], Disp: , Rfl:   ???  pantoprazole (PROTONIX) 20 MG tablet, Take 1 tablet (20 mg total) by mouth in the morning., Disp: 60 tablet, Rfl: 5  ???  pen needle, diabetic (PEN NEEDLE) 31 gauge x 5/16 (8 mm) Ndle, Injection Frequency is 1 time per day; Dx Code: Type 2 Diabetes uncontrolled (E11.65), Disp: 50 each, Rfl: 11  ???  rosuvastatin (CRESTOR) 5 MG tablet, Take 1 tablet (5 mg total) by mouth every other day., Disp: 15 tablet, Rfl: 11  ???  sertraline (ZOLOFT) 25 MG tablet, Take 1 tablet (25 mg total) by mouth daily., Disp: 90 tablet, Rfl: 3  ???  SPIRIVA RESPIMAT 2.5 mcg/actuation inhalation mist, INHALE TWO PUFFS BY MOUTH ONCE DAILY, Disp: 4 g, Rfl: 11  ???  spironolactone (ALDACTONE) 25 MG tablet, Take 1 tablet (25 mg total) by mouth daily., Disp: , Rfl:   ???  tafamidis (VYNDAMAX) 61 mg cap, Take 1 capsule (61 mg) by mouth daily., Disp: 30 capsule, Rfl: 11  ???  torsemide (DEMADEX) 20 MG tablet, Take 2 tablets (40 mg total) by mouth daily. May also take 1 tablet (20 mg total) daily as needed (weight gain for 3lbs overnight or 5lbs in a week)., Disp: 180 tablet, Rfl: 3  ???  varicella-zoster gE-AS01B, PF, (SHINGRIX, PF,) 50 mcg/0.5 mL SusR injection, Inject 0.5 mL into the muscle., Disp: 0.5 mL, Rfl: 1    Allergies  Nitrofurantoin and Lipitor [atorvastatin]    Family History  Family History   Problem Relation Age of Onset   ??? Hypertension Mother    ??? Cancer Father         COLON CANCER   ??? Anesthesia problems Neg Hx    ??? Broken bones Neg Hx    ??? Clotting disorder Neg Hx    ???  Collagen disease Neg Hx    ??? Diabetes Neg Hx    ??? Dislocations Neg Hx    ??? Fibromyalgia Neg Hx    ??? Gout Neg Hx    ??? Hemophilia Neg Hx    ??? Osteoporosis Neg Hx    ??? Rheumatologic disease Neg Hx    ??? Scoliosis Neg Hx    ??? Severe sprains Neg Hx    ??? Sickle cell anemia Neg Hx    ??? Spinal Compression Fracture Neg Hx    ??? GU problems Neg Hx    ??? Kidney cancer Neg Hx    ??? Prostate cancer Neg Hx        Social History  Social History     Tobacco Use   ??? Smoking status: Former     Packs/day: 0.33     Types: Cigarettes     Quit date: 2019     Years since quitting: 4.4   ??? Smokeless tobacco: Never   ??? Tobacco comments:     Quit a few years ago   Vaping Use   ??? Vaping Use: Never used   Substance Use Topics   ??? Alcohol use: No   ??? Drug use: No        Physical Exam     VITAL SIGNS:      Vitals:    05/21/22 1948 05/21/22 2343   BP: 116/59 126/60   Pulse: 60 57   Resp: 22 20   Temp: 37.1 ??C (98.7 ??F)    TempSrc: Temporal    SpO2: 95% 95%   Weight: 97.5 kg (215 lb)    Height: 165.1 cm (5' 5)        Constitutional: Alert and oriented. No acute distress.  Eyes: Conjunctivae are normal.  HEENT: Normocephalic and atraumatic. Conjunctivae clear. No congestion. Moist mucous membranes.   Cardiovascular: Good S1 and S2. No murmurs.   Respiratory: Diminished bilateral breaths in lower lobes. No wheezes or crackles.   Gastrointestinal: Soft, non-distended, non-tender.  Genitourinary: Deferred.  Musculoskeletal: No bilateral lower extremity edema.   Neurologic: Normal speech and language. No gross focal neurologic deficits are appreciated. Patient is moving all extremities equally, face is symmetric at rest and with speech.  Skin: Skin is warm, dry and intact. No rash noted.  Psychiatric: Mood and affect are normal. Speech and behavior are normal.     Radiology     XR Chest 2 views   Final Result      Cardiomegaly and pulmonary edema. Small pleural effusions.        Pertinent labs & imaging results that were available during my care of the patient were independently interpreted by me and considered in my medical decision making (see chart for details).    Portions of this record have been created using Scientist, clinical (histocompatibility and immunogenetics). Dictation errors have been sought, but may not have been identified and corrected.    Documentation assistance was provided by Marlyn Corporal and Michaelene Song, Scribe on May 22, 2022 at 12:37 AM for Nat Christen, MD.       Documentation assistance was provided by the scribe in my presence.  The documentation recorded by the scribe has been reviewed by me and accurately reflects the services I personally performed.      Note has been documented by Marlyn Corporal on 05/22/2022     Nat Christen, MD  Resident  05/22/22 [phone]

## 2022-05-22 NOTE — Unmapped (Signed)
Today,    MEDICATIONS:  We are changing your medications today.  - Take extra torsemide 20mg  tomorrow and Sunday  Call if you have questions about your medications.    LABS:  We will call you if your labs need attention.    NEXT APPOINTMENT:  Return to clinic in 2 weeks with Derwood Kaplan NP - Friday 06/05/22 @ 3pm      In general, to take care of your heart failure:  -Limit your fluid intake to 2 Liters (half-gallon) per day.    -Limit your salt intake to ideally 2-3 grams (2000-3000 mg) per day.  -Weigh yourself daily and record, and bring that weight diary to your next appointment.  (Weight gain of 2-3 pounds in 1 day typically means fluid weight.)    The medications for your heart are to help your heart and help you live longer.    Please contact us before stopping any of your heart medications.    Call the clinic at [phone] with questions.  Our clinic fax number is [phone].  If you need to reschedule future appointments, please call [phone] or [phone]  My office number (c/o De Burrs RN) is [phone] if you need further assistance.  After office hours, if you have urgent questions/problems, contact the on-call cardiologist through the hospital operator: [phone].    Please do not send a MyChart message for potentially life-threatening symptoms.  Please call 911 for a true medical emergency.    To learn more about heart failure, please read [place]'s Learning to Live with Heart Failure:  [email or web address].    Available online at   [email or web address] - then click the link Learning to Live with Heart Failure patient booklet  OR  [email or web address] - open the window for Medical Management and click the link Living with Heart Failure

## 2022-05-22 NOTE — Unmapped (Addendum)
Pt presents w SOB and left sided rib pain when breathing.  Pt on 3L at home. Denies NVD.   Placed on 4L in Triage.

## 2022-05-22 NOTE — Unmapped (Addendum)
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    Pt presents w SOB and left sided rib pain when breathing.  Pt on 3L at home. Denies NVD.   Placed on 4L in Triage.     Had to inc oxygen from 3-4  O2 sats 88%  Had first trop 46 > second to 53  Also with L side chest pain    CXR Cardiomegaly and pulmonary edema. Small pleural effusions.     Cr stable 2.84  Had 3 lb weight gain in last 24 hours  Doesn't look fluid overloaded  Sounds diminished in B.l lower lobes  CXR fluffy    At beginning of week had sniffles  Maybe CHFe  Demand ischemia

## 2022-06-04 NOTE — Unmapped (Signed)
[facility] Refill Coordination Note    Specialty Medication(s) to be Shipped:   General Specialty: Vyndamax    Other medication(s) to be shipped: No additional medications requested for fill at this time     Barbara Huber, DOB: 06-22-1941  Phone: [phone] (home)       All above HIPAA information was verified with patient.     Was a Nurse, learning disability used for this call? No    Completed refill call assessment today to schedule patient's medication shipment from the [facility] [phone]).  All relevant notes have been reviewed.     Specialty medication(s) and dose(s) confirmed: Regimen is correct and unchanged.   Changes to medications: [name] reports no changes at this time.  Changes to insurance: No  New side effects reported not previously addressed with a pharmacist or physician: None reported  Questions for the pharmacist: No    Confirmed patient received a Conservation officer, historic buildings and a Surveyor, mining with first shipment. The patient will receive a drug information handout for each medication shipped and additional FDA Medication Guides as required.       DISEASE/MEDICATION-SPECIFIC INFORMATION        N/A    SPECIALTY MEDICATION ADHERENCE     Medication Adherence    Patient reported X missed doses in the last month: 0  Specialty Medication: Vyndamax 61mg   Patient is on additional specialty medications: No  Patient is on more than two specialty medications: No              Were doses missed due to medication being on hold? No    Vyndamax 61 mg: 10-11 days of medicine on hand       REFERRAL TO PHARMACIST     Referral to the pharmacist: Not needed      [facility]     Shipping address confirmed in Epic.     Delivery Scheduled: Yes, Expected medication delivery date: 06/09/22.     Medication will be delivered via Next Day Courier to the prescription address in Epic WAM.    Nancy Nordmann [facility] Specialty Technician

## 2022-06-05 ENCOUNTER — Ambulatory Visit: Admit: 2022-06-05 | Discharge: 2022-06-05 | Payer: MEDICARE | Attending: Adult Health | Primary: Adult Health

## 2022-06-05 ENCOUNTER — Ambulatory Visit: Admit: 2022-06-05 | Discharge: 2022-06-05 | Payer: MEDICARE

## 2022-06-05 DIAGNOSIS — I4819 Other persistent atrial fibrillation: Principal | ICD-10-CM

## 2022-06-05 DIAGNOSIS — I5032 Chronic diastolic (congestive) heart failure: Principal | ICD-10-CM

## 2022-06-05 DIAGNOSIS — J42 Unspecified chronic bronchitis: Principal | ICD-10-CM

## 2022-06-05 DIAGNOSIS — R944 Abnormal results of kidney function studies: Principal | ICD-10-CM

## 2022-06-05 DIAGNOSIS — I10 Essential (primary) hypertension: Principal | ICD-10-CM

## 2022-06-05 LAB — BASIC METABOLIC PANEL
ANION GAP: 7 mmol/L (ref 5–14)
BLOOD UREA NITROGEN: 58 mg/dL — ABNORMAL HIGH (ref 9–23)
BUN / CREAT RATIO: 18
CALCIUM: 8.7 mg/dL (ref 8.7–10.4)
CHLORIDE: 101 mmol/L (ref 98–107)
CO2: 29.5 mmol/L (ref 20.0–31.0)
CREATININE: 3.23 mg/dL — ABNORMAL HIGH
EGFR CKD-EPI (2021) FEMALE: 14 mL/min/{1.73_m2} — ABNORMAL LOW (ref >=60)
GLUCOSE RANDOM: 155 mg/dL (ref 70–179)
POTASSIUM: 4.7 mmol/L (ref 3.4–4.8)
SODIUM: 137 mmol/L (ref 135–145)

## 2022-06-05 LAB — B-TYPE NATRIURETIC PEPTIDE: B-TYPE NATRIURETIC PEPTIDE: 451.18 pg/mL — ABNORMAL HIGH (ref <=100)

## 2022-06-05 LAB — MAGNESIUM: MAGNESIUM: 3.9 mg/dL — ABNORMAL HIGH (ref 1.6–2.6)

## 2022-06-05 MED ORDER — TIOTROPIUM 2.5 MCG-OLODATEROL 2.5 MCG/ACTUATION MIST FOR INHALATION
Freq: Every day | RESPIRATORY_TRACT | 11 refills | 28.00000 days | Status: CP
Start: 2022-06-05 — End: 2022-06-05

## 2022-06-05 NOTE — Unmapped (Addendum)
[name] Clinic Discharge Instructions:    MEDICATIONS:  We are changing your medications today.  - Take extra 20mg  torsemide until weight is ~212lbs on your scale, then reduce to just 40mg  daily. If weight goes up again, may need to add an extra 20mg  on a regular basis (maybe MWF?)  Call if you have questions about your medications.    IMPORTANT INSTRUCTIONS:  - Weigh yourself daily in the morning; write your weight down and bring it with you next time   - Limit your fluid intake to 2 Liters (half-gallon) per day    - Limit your salt intake to 2-3 grams (2000-3000 mg) per day  - If you have a morning diuresis appointment, don't take your morning diuretic  - If you have an afternoon diuresis appointment, take your morning diuretic    LABS:  We will call you if your labs need attention    NEXT APPOINTMENT:  Return to clinic in 3 weeks with Derwood Kaplan, AGNP      My office number (c/o De Burrs RN) is [phone] if you need further assistance, or need to schedule with our [name] clinic in the future.    If you need to reschedule future appointments, please call [phone].   After office hours, if you have urgent questions/problems, contact the on-call cardiologist through the hospital operator: [phone].    Please do not send a MyChart message for potentially life-threatening symptoms or time-sensitive issues. MyChart messages are not monitored after normal business hours or on weekends. Please call 911 for a true medical emergency.

## 2022-06-05 NOTE — Unmapped (Addendum)
Thank you for visiting the [facility].    If you have any questions or concerns, please call     Haze Justin, MD  [name], RN, Pulmonary Hypertension Nurse Coordinator  Claretha Cooper, RN, Pulmonary Hypertension Nurse Coordinator    [phone].             I will change your inhaler to STIOLTO alone, 2 breaths once a day (just once a day), this will replace both Advair and Spiriva, but no rush to make this change (use up what you already have at home).

## 2022-06-05 NOTE — Unmapped (Signed)
[facility] Note    Referring Provider: Artelia Laroche, MD  [phone] [facility] [address] [place],  [place] [address]   Primary Provider: Jacquiline Doe, MD  [address]  [place] [address]   Other Providers:  Dr Luretha Murphy    Reason for Visit:  Barbara Huber is a 81 y.o. female being seen for routine visit and continued care of cardiac amyloidosis .    Assessment & Plan:  1. 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   - Moderately increased wall thickness with low voltage ECG - she's had issues with hypotension on ARB and BB, as well as atrial arrhythmias. SPEP/UPEP/free light chains without concern. 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 She has one daughter who accompanies her and is informed.   - DC'd Jardiance 10mg  daily due to recurrent UTIs  - Weight is up from baseline. Take extra torsemide 20mg  until weight is ~212lb on home scale, then resume 40mg  daily. If needed extra frequently, consider increasing to 60mg  on MWF.   - Continue Tafamidis 61 mg daily   - Continue spironolactone 25 mg daily   - She is not interested in CardioMEMS at this time    2. Persistent atrial fibrillation  - CHA2DS2-VASc score = 5 (1-gender, 2-Age, 1-DM, 1-HTN)  - We stopped Eliquis given recurrent anemia, shared decision making w/ pt and daughter. She is not interested in [place] (or any procedures) at this time  - She had been in afib since at least 07/2020; now s/p cardioversion 01/10/21 and continues to be in NSR since  - On amiodarone 200mg  daily - increased it back herself after seeing no improvement in tremors/jerking with decreased dose  - amiodarone monitoring - last PFTs in 04/2021. Due for TSH and LFTs today. EKG next visit.    3. [name]  - Cr staying around 2.3-2.5; today's results pending    4. COPD  - home O2 3L [place] - followed by pulm  - on Advair and Spiriva    5. Iron deficiency anemia  - Finsished IV Iron - can recheck iron panel at follow up    6. Anxiety  - Tolerating Zoloft 25mg  with improved moved/anxiety    Follow-up:  Return in about 3 weeks (around 06/26/2022).     History of Present Illness:  Barbara Huber is a 81 y.o. female with PMHx of COPD on 3L home [place], CHF, [name], [name], [name] She was initially admitted with decompensated [name] in 2021 in setting of  Septic Shock d/t Gangrenous Cholecystitis s/p cholecystectomy. She underwent further evaluation with PYP scan which showed TTR amyloid. She has since been followed in clinic for management of her [name]. In 2022, had numerous admissions for weakness/urosepsis, shortness of breath/orthopnea/weight gain, lightheadedness/SOB/Blurry vision found to be anemic.    Summary of most recent visits:  - 01/16/22 [facility]): Feeling well from a cardiac standpoint. Started Zoloft for anxiety  - 02/13/22 [facility] Weight 210-211. Feeling well. Taking extra torsemide 20mg  about every other day, which was stopped for elevated Cr.   - 03/13/22 ([name]): Weight 201-216, took metolazone PRN, weight stabilized at 210lbs. Considering cardioMEMS.   - 05/13/22 ([name]): Had 3 iron infusions. Doing well w/ volume, weight 211-212lbs. Cooking, staying active around  house.   - 05/21/22 ([name]): CP/SOB. Weight 217 from 213. She took metolazone x1 dose.   - 05/22/22 ([name]): Ed follow up. Back down to 213lbs. Still having CP w/ certain movement.     Interval history  Today she presents for follow up. She had been having problems with her O2 dropping and they finally figured out her nasal canulla was bad and was probably leaking. When she changed it out for a new one, the O2 stopped dropping. She has been feeling well, although weight is not as low as she'd like it to be. It's running 214-216lbs and should be around 212lbs. She did take metolazone earlier this week when weight got to 218lbs. She has some left ankle swelling; her son in law fell on her left foot and hurt it the other day. It's bruised on the top of the foot. She does not feel bloated or SOB.     Cardiovascular History & Procedures:    Cath / PCI:  none    CV Surgery:   none    EP Procedures and Devices:  none    Non-Invasive Evaluation(s):    Echo:  12/09/21  Summary    1. The left ventricle is normal in size with severely increased wall  thickness.    2. The left ventricular systolic function is hyperdynamic, LVEF is visually  estimated at >70%.    3. There is grade III diastolic dysfunction (severely elevated filling  pressure).    4. The left atrium is moderately to severely dilated in size.    5. The right ventricle is mildly dilated in size, with normal systolic  function.    6. The right atrium is mildly dilated  in size.    7. IVC size and inspiratory change suggest elevated right atrial pressure.  (10-20 mmHg).    04/24/21   1. The left ventricle is normal in size with mildly increased wall  thickness.    2. The left ventricular systolic function is normal, LVEF is visually  estimated at > 55%.    3. There is grade II diastolic dysfunction (elevated filling pressure).    4. Mitral annular calcification is present (mild).    5. The left atrium is mildly dilated in size.    6. The right ventricle is mildly dilated in size, with low normal systolic  function.    7. IVC size and inspiratory change suggest elevated right atrial pressure.  (10-20 mmHg).    09/30/20  Summary    1. Limited study to assess systolic function.    2. IVC size and inspiratory change suggest normal right atrial pressure.  (0-5 mmHg).    3. The left ventricle is normal in size with mildly to moderately increased  wall thickness.    4. The left ventricular systolic function is normal with no obvious wall  motion abnormalities, LVEF is visually estimated at > 55%.    5. Mitral annular calcification is present.    6. The left atrium is mildly dilated in size.    7. The right ventricle is upper normal in size, with reduced systolic  Function.    Cardiac CT/MRI/Nuclear Tests:   None    6 Minute Walk:  None    Cardiopulmonary Stress Tests:   None               Other Past Medical History:  See below for the complete EPIC list of past medical and surgical history.  Allergies:  Nitrofurantoin and Lipitor [atorvastatin]    Current Medications:  Current Outpatient Medications   Medication Sig Dispense Refill    acetaminophen (TYLENOL) 500 MG tablet Take 2 tablets (1,000 mg total) by mouth daily as needed for pain.      albuterol HFA 90 mcg/actuation inhaler Inhale 2 puffs every eight (8) hours as needed for wheezing.      amiodarone (PACERONE) 200 MG tablet Take 1 tablet (200 mg total) by mouth daily. 90 tablet 3    cranberry 500 mg cap Take 500 mg by mouth daily with evening meal.      estradioL (ESTRACE) 0.01 % (0.1 mg/gram) vaginal cream INSERT ONE GRAM VAGINALLY AT BEDTIME      ferrous sulfate 325 (65 FE) MG tablet Take 1 tablet (325 mg total) by mouth every other day. 45 tablet 3    fosfomycin (MONUROL) 3 gram Pack Take 3 g by mouth once a week. 36 g 3    gabapentin (NEURONTIN) 300 MG capsule Take 1 capsule (300 mg total) by mouth two (2) times a day. 180 capsule 3    insulin glargine (BASAGLAR, LANTUS) 100 unit/mL (3 mL) injection pen Inject 0.04 mL (4 Units total) under the skin nightly. 15 mL 3    montelukast (SINGULAIR) 10 mg tablet Take 1 tablet (10 mg total) by mouth in the morning. 90 tablet 2    NARCAN 4 mg/actuation nasal spray 1 spray into alternating nostrils once as needed (opioid overdose). PRN - Emergency use.      NON FORMULARY APPLY FROM NECK DOWN TWICE A DAY      OXYCONTIN 10 mg TR12 12 hr crush resistant ER/CR tablet Take 1 tablet (10 mg total) by mouth every twelve (12) hours.      OXYGEN-AIR DELIVERY SYSTEMS MISC 5 L by Miscellaneous route. Currently using 3  L/min via [place]      pantoprazole (PROTONIX) 20 MG tablet Take 1 tablet (20 mg total) by mouth in the morning. 60 tablet 5    rosuvastatin (CRESTOR) 5 MG tablet Take 1 tablet (5 mg total) by mouth every other day. 15 tablet 11    spironolactone (ALDACTONE) 25 MG tablet Take 1 tablet (25 mg total) by mouth daily.      tafamidis (VYNDAMAX) 61 mg cap Take 1 capsule (61 mg) by mouth daily. 30 capsule 11    tiotropium-olodateroL 2.5-2.5 mcg/actuation Mist Inhale 2 puffs daily. 4 g 11    torsemide (DEMADEX) 20 MG tablet Take 2 tablets (40 mg total) by mouth daily. May also take 1 tablet (20 mg total) daily as needed (weight gain for 3lbs overnight or 5lbs in a week). 180 tablet 3    varicella-zoster gE-AS01B, PF, (SHINGRIX, PF,) 50 mcg/0.5 mL SusR injection Inject 0.5 mL into the muscle. 0.5 mL 1    ACCU-CHEK AVIVA PLUS TEST STRP Strp UESE TO CHECK BLOOD SUGAR 3 TIMES A DAY BEFORE MEALS 100 each 11    lancets Misc 1 each by Miscellaneous route daily. Accu Check 100 each 6    metOLazone (ZAROXOLYN) 5 MG tablet Take 1 tablet (5 mg total) by mouth daily as needed (when instructed by cardiology clinic). 30 tablet 1    pen needle, diabetic (PEN NEEDLE) 31 gauge x 5/16 (8 mm) Ndle Injection Frequency is 1 time per day; Dx Code: Type 2 Diabetes uncontrolled (E11.65) 50 each 11    sertraline (ZOLOFT) 25 MG tablet Take 1 tablet (25 mg total) by mouth daily. (Patient not taking:  Reported on 06/05/2022) 90 tablet 3     No current facility-administered medications for this visit.       Family History:  The patient's family history includes Cancer in her father; Hypertension in her mother.    Social history:  She  reports that she quit smoking about 4 years ago. Her smoking use included cigarettes. She smoked an average of .33 packs per day. She has never used smokeless tobacco. She reports that she does not drink alcohol and does not use drugs.    Review of Systems:  As per HPI.  Rest of the review of ten systems is negative or unremarkable except as stated above.    Physical Exam:  VITAL SIGNS:   Vitals:    06/05/22 1534   BP: 113/57 Pulse: 56   SpO2: 96%          Wt Readings from Last 3 Encounters:   06/05/22 98.6 kg (217 lb 6.4 oz)   06/05/22 98 kg (216 lb)   05/22/22 97 kg (213 lb 12.8 oz)      Today's Body mass index is 35.09 kg/m??.   Height: 167.6 cm (5' 6)  CONSTITUTIONAL: well-appearing in no acute distress  EYES: Conjunctivae and sclerae clear and anicteric.  ENT: Benign.   CARDIOVASCULAR: JVP not seen above the clavicle with HOB at 90 degrees. Rate and rhythm are regular.  There is no lifts or heaves.  Normal S1, S2. There is no murmur, gallops or rubs.  Radial and pedal pulses are 2+, bilaterally.   There is 1+ pitting edema to ankles, left greater than right.   RESPIRATORY: Decreased breath sounds at bases.  There are no wheezes.  GASTROINTESTINAL: Soft, non-tender, with audible bowel sounds. Abdomen nondistended.  Liver is nonpalpable.  SKIN: No rashes, ecchymosis or petechiae.  Warm, well perfused.   MUSCULOSKELETAL:  no joint swelling   NEURO/PSYCH: Appropriate mood and affect. Alert and oriented to person, place, and time. No gross motor or sensory deficits evident.    Pertinent Laboratory Studies:   Admission on 05/21/2022, Discharged on 05/22/2022   Component Date Value Ref Range Status    Sodium 05/21/2022 139  135 - 145 mmol/L Final    Potassium 05/21/2022 5.0 (H)  3.4 - 4.8 mmol/L Final    Chloride 05/21/2022 106  98 - 107 mmol/L Final    CO2 05/21/2022 25.7  20.0 - 31.0 mmol/L Final    Anion Gap 05/21/2022 7  5 - 14 mmol/L Final    BUN 05/21/2022 52 (H)  9 - 23 mg/dL Final    Creatinine 81/19/1478 2.84 (H)  0.60 - 0.80 mg/dL Final    BUN/Creatinine Ratio 05/21/2022 18   Final    eGFR [name]-EPI (2021) Female 05/21/2022 16 (L)  >=60 mL/min/1.19m2 Final    Glucose 05/21/2022 145  70 - 179 mg/dL Final    Calcium 29/56/2130 8.9  8.7 - 10.4 mg/dL Final    Albumin 86/57/8469 4.0  3.4 - 5.0 g/dL Final    Total Protein 05/21/2022 7.6  5.7 - 8.2 g/dL Final    Total Bilirubin 05/21/2022 0.3  0.3 - 1.2 mg/dL Final    AST 62/95/2841 17 <=34 U/L Final    ALT 05/21/2022 <7 (L)  10 - 49 U/L Final    Alkaline Phosphatase 05/21/2022 83  46 - 116 U/L Final    WBC 05/21/2022 8.0  3.6 - 11.2 10*9/L Final    RBC 05/21/2022 4.52  3.95 - 5.13 10*12/L Final  HGB 05/21/2022 11.7  11.3 - 14.9 g/dL Final    HCT 14/78/2956 37.8  34.0 - 44.0 % Final    MCV 05/21/2022 83.6  77.6 - 95.7 fL Final    MCH 05/21/2022 25.9  25.9 - 32.4 pg Final    MCHC 05/21/2022 31.0 (L)  32.0 - 36.0 g/dL Final    RDW 21/30/8657 22.5 (H)  12.2 - 15.2 % Final    MPV 05/21/2022 9.6  6.8 - 10.7 fL Final    Platelet 05/21/2022 179  150 - 450 10*9/L Final    Anisocytosis 05/21/2022 Marked (A)  Not Present Final    Color, UA 05/21/2022 Colorless   Final    Clarity, UA 05/21/2022 Clear   Final    Specific Gravity, UA 05/21/2022 1.009  1.003 - 1.030 Final    pH, UA 05/21/2022 6.5  5.0 - 9.0 Final    Leukocyte Esterase, UA 05/21/2022 Small (A)  Negative Final    Nitrite, UA 05/21/2022 Negative  Negative Final    Protein, UA 05/21/2022 Negative  Negative Final    Glucose, UA 05/21/2022 Negative  Negative Final    Ketones, UA 05/21/2022 Negative  Negative Final    Urobilinogen, UA 05/21/2022 <2.0 mg/dL  <8.4 mg/dL Final    Bilirubin, UA 05/21/2022 Negative  Negative Final    Blood, UA 05/21/2022 Negative  Negative Final    RBC, UA 05/21/2022 1  <=4 /HPF Final    WBC, UA 05/21/2022 10 (H)  0 - 5 /HPF Final    Squam Epithel, UA 05/21/2022 1  0 - 5 /HPF Final    Bacteria, UA 05/21/2022 Rare (A)  None Seen /HPF Final    Hyaline Casts, UA 05/21/2022 7 (H)  0 - 1 /LPF Final    Urine Culture, Comprehensive 05/21/2022 Mixed Urogenital Flora   Final    Smear Review Comments 05/21/2022 See Comment (A)  Undefined Final    hsTroponin I 05/21/2022 46 (HH)  <=34 ng/L Final    EKG Ventricular Rate 05/22/2022 59  BPM Final    EKG Atrial Rate 05/22/2022 59  BPM Final    EKG P-R Interval 05/22/2022 218  ms Final    EKG QRS Duration 05/22/2022 96  ms Final    EKG Q-T Interval 05/22/2022 508  ms Final    EKG QTC Calculation 05/22/2022 502  ms Final    EKG Calculated P Axis 05/22/2022 61  degrees Final    EKG Calculated R Axis 05/22/2022 97  degrees Final    EKG Calculated T Axis 05/22/2022 49  degrees Final    QTC Fredericia 05/22/2022 505  ms Final    hsTroponin I 05/22/2022 53 (HH)  <=34 ng/L Final    delta hsTroponin I 05/22/2022 7  <=7 ng/L Final   Infusion on 05/13/2022   Component Date Value Ref Range Status    Sodium 05/13/2022 141  135 - 145 mmol/L Final    Potassium 05/13/2022    Final    Chloride 05/13/2022 101  98 - 107 mmol/L Final    CO2 05/13/2022 31.2 (H)  20.0 - 31.0 mmol/L Final    Anion Gap 05/13/2022 9  5 - 14 mmol/L Final    BUN 05/13/2022 66 (H)  9 - 23 mg/dL Final    Creatinine 69/62/9528 2.55 (H)  0.60 - 0.80 mg/dL Final    BUN/Creatinine Ratio 05/13/2022 26   Final    eGFR [name]-EPI (2021) Female 05/13/2022 19 (L)  >=60 mL/min/1.43m2 Final  Glucose 05/13/2022 102  70 - 179 mg/dL Final    Calcium 16/09/9603 9.1  8.7 - 10.4 mg/dL Final    BNP 54/08/8118 200.15 (H)  <=100 pg/mL Final    Magnesium 05/13/2022 3.3 (H)  1.6 - 2.6 mg/dL Final    WBC 14/78/2956 6.6  3.6 - 11.2 10*9/L Final    RBC 05/13/2022 4.72  3.95 - 5.13 10*12/L Final    HGB 05/13/2022 11.7  11.3 - 14.9 g/dL Final    HCT 21/30/8657 38.7  34.0 - 44.0 % Final    MCV 05/13/2022 82.1  77.6 - 95.7 fL Final    MCH 05/13/2022 24.9 (L)  25.9 - 32.4 pg Final    MCHC 05/13/2022 30.3 (L)  32.0 - 36.0 g/dL Final    RDW 84/69/6295 21.4 (H)  12.2 - 15.2 % Final    MPV 05/13/2022 9.2  6.8 - 10.7 fL Final    Platelet 05/13/2022 178  150 - 450 10*9/L Final   Office Visit on 05/13/2022   Component Date Value Ref Range Status    Color, UA 05/13/2022 Yellow   Final    Clarity, UA 05/13/2022 Hazy   Final    Specific Gravity, UA 05/13/2022 1.015  1.005 - 1.030 Final    pH, UA 05/13/2022 5.5  5.0 - 9.0 Final    Leukocyte Esterase, UA 05/13/2022 Small (A)  Negative Final    Nitrite, UA 05/13/2022 Negative  Negative Final    Protein, UA 05/13/2022 Negative Negative Final    Glucose, UA 05/13/2022 Negative  Negative Final    Ketones, UA 05/13/2022 Negative  Negative Final    Urobilinogen, UA 05/13/2022 0.2 mg/dL  0.2 - 2.0 mg/dL Final    Bilirubin, UA 05/13/2022 Negative  Negative Final    Blood, UA 05/13/2022 Small (A)  Negative Final    RBC, UA 05/13/2022 21 (H)  0 - 3 /HPF Final    WBC, UA 05/13/2022 40 (H)  0 - 3 /HPF Final    Squam Epithel, UA 05/13/2022 11 (H)  0 - 5 /HPF Final    Bacteria, UA 05/13/2022 Many (A)  None Seen /HPF Final    WBC Clumps 05/13/2022 Few (A)  None Seen /HPF Final    Urine Culture, Comprehensive 05/13/2022 >100,000 CFU/mL Klebsiella species (A)   Final    Urine Culture, Comprehensive 05/13/2022 >100,000 CFU/mL Streptococcus bovis group (A)   Final    TSH 05/13/2022 2.140  0.550 - 4.780 uIU/mL Final    Albumin 05/13/2022 4.0  3.4 - 5.0 g/dL Final    Total Protein 05/13/2022 7.8  5.7 - 8.2 g/dL Final    Total Bilirubin 05/13/2022 0.4  0.3 - 1.2 mg/dL Final    Bilirubin, Direct 05/13/2022 0.10  0.00 - 0.30 mg/dL Final    AST 28/41/3244    Final    ALT 05/13/2022 9 (L)  10 - 49 U/L Final    Alkaline Phosphatase 05/13/2022 79  46 - 116 U/L Final   Office Visit on 04/23/2022   Component Date Value Ref Range Status    Iron 04/23/2022 23 (L)  50 - 170 ug/dL Final    TIBC 12/30/7251 329  250 - 425 ug/dL Final    Iron Saturation (%) 04/23/2022 7 (L)  20 - 55 % Final    Ferritin 04/23/2022 16.4  7.3 - 270.7 ng/mL Final    WBC 04/23/2022 8.4  3.6 - 11.2 10*9/L Final    RBC 04/23/2022 4.47  3.95 - 5.13 10*12/L  Final    HGB 04/23/2022 10.8 (L)  11.3 - 14.9 g/dL Final    HCT 81/19/1478 35.9  34.0 - 44.0 % Final    MCV 04/23/2022 80.3  77.6 - 95.7 fL Final    MCH 04/23/2022 24.1 (L)  25.9 - 32.4 pg Final    MCHC 04/23/2022 30.0 (L)  32.0 - 36.0 g/dL Final    RDW 29/56/2130 21.9 (H)  12.2 - 15.2 % Final    MPV 04/23/2022 8.0  6.8 - 10.7 fL Final    Platelet 04/23/2022 225  150 - 450 10*9/L Final    nRBC 04/23/2022 0  <=4 /100 WBCs Final    Neutrophils % 04/23/2022 73.5  % Final    Lymphocytes % 04/23/2022 12.0  % Final    Monocytes % 04/23/2022 9.4  % Final    Eosinophils % 04/23/2022 3.8  % Final    Basophils % 04/23/2022 1.3  % Final    Absolute Neutrophils 04/23/2022 6.2  1.8 - 7.8 10*9/L Final    Absolute Lymphocytes 04/23/2022 1.0 (L)  1.1 - 3.6 10*9/L Final    Absolute Monocytes 04/23/2022 0.8  0.3 - 0.8 10*9/L Final    Absolute Eosinophils 04/23/2022 0.3  0.0 - 0.5 10*9/L Final    Absolute Basophils 04/23/2022 0.1  0.0 - 0.1 10*9/L Final    Anisocytosis 04/23/2022 Marked (A)  Not Present Final    Creat U 04/23/2022 55.2  Undefined mg/dL Final    Albumin Quantitative, Urine 04/23/2022 <0.3  Undefined mg/dL Final    Albumin/Creatinine Ratio 04/23/2022    Final    Smear Review Comments 04/23/2022 See Comment (A)  Undefined Final    Ovalocytes 04/23/2022 Moderate (A)  Not Present Final    Burr Cells 04/23/2022 Present (A)  Not Present Final    Poikilocytosis 04/23/2022 Moderate (A)  Not Present Final   Office Visit on 03/13/2022   Component Date Value Ref Range Status    BNP 03/13/2022 268.76 (H)  <=100 pg/mL Final    Sodium 03/13/2022 139  135 - 145 mmol/L Final    Potassium 03/13/2022 4.5  3.4 - 4.8 mmol/L Final    Chloride 03/13/2022 103  98 - 107 mmol/L Final    CO2 03/13/2022 27.4  20.0 - 31.0 mmol/L Final    Anion Gap 03/13/2022 9  5 - 14 mmol/L Final    BUN 03/13/2022 59 (H)  9 - 23 mg/dL Final    Creatinine 86/57/8469 2.33 (H)  0.60 - 0.80 mg/dL Final    BUN/Creatinine Ratio 03/13/2022 25   Final    eGFR [name]-EPI (2021) Female 03/13/2022 21 (L)  >=60 mL/min/1.55m2 Final    Glucose 03/13/2022 188 (H)  70 - 179 mg/dL Final    Calcium 62/95/2841 9.1  8.7 - 10.4 mg/dL Final    Magnesium 32/44/0102 3.0 (H)  1.6 - 2.6 mg/dL Final    WBC 72/53/6644 9.0  3.6 - 11.2 10*9/L Final    RBC 03/13/2022 3.63 (L)  3.95 - 5.13 10*12/L Final    HGB 03/13/2022 8.3 (L)  11.3 - 14.9 g/dL Final    HCT 03/47/4259 27.5 (L)  34.0 - 44.0 % Final    MCV 03/13/2022 75.8 (L)  77.6 - 95.7 fL Final    MCH 03/13/2022 22.9 (L)  25.9 - 32.4 pg Final    MCHC 03/13/2022 30.2 (L)  32.0 - 36.0 g/dL Final    RDW 56/38/7564 18.0 (H)  12.2 - 15.2 % Final    MPV  03/13/2022 8.1  6.8 - 10.7 fL Final    Platelet 03/13/2022 266  150 - 450 10*9/L Final    Hemoglobin A1C 03/13/2022 5.6  4.8 - 5.6 % Final    Estimated Average Glucose 03/13/2022 114  mg/dL Final   Office Visit on 02/19/2022   Component Date Value Ref Range Status    Sodium 02/19/2022 141  135 - 145 mmol/L Final    Potassium 02/19/2022 4.4  3.4 - 4.8 mmol/L Final    Chloride 02/19/2022 101  98 - 107 mmol/L Final    CO2 02/19/2022 31.5 (H)  20.0 - 31.0 mmol/L Final    Anion Gap 02/19/2022 9  5 - 14 mmol/L Final    BUN 02/19/2022 54 (H)  9 - 23 mg/dL Final    Creatinine 16/09/9603 2.71 (H)  0.60 - 0.80 mg/dL Final    BUN/Creatinine Ratio 02/19/2022 20   Final    eGFR [name]-EPI (2021) Female 02/19/2022 17 (L)  >=60 mL/min/1.68m2 Final    Glucose 02/19/2022 149  70 - 179 mg/dL Final    Calcium 54/08/8118 8.9  8.7 - 10.4 mg/dL Final   Office Visit on 02/13/2022   Component Date Value Ref Range Status    Sodium 02/13/2022 137  135 - 145 mmol/L Final    Potassium 02/13/2022 4.4  3.4 - 4.8 mmol/L Final    Chloride 02/13/2022 97 (L)  98 - 107 mmol/L Final    CO2 02/13/2022 34.3 (H)  20.0 - 31.0 mmol/L Final    Anion Gap 02/13/2022 6  5 - 14 mmol/L Final    BUN 02/13/2022 66 (H)  9 - 23 mg/dL Final    Creatinine 14/78/2956 3.20 (H)  0.60 - 0.80 mg/dL Final    BUN/Creatinine Ratio 02/13/2022 21   Final    eGFR [name]-EPI (2021) Female 02/13/2022 14 (L)  >=60 mL/min/1.32m2 Final    Glucose 02/13/2022 142  70 - 179 mg/dL Final    Calcium 21/30/8657 9.0  8.7 - 10.4 mg/dL Final    BNP 84/69/6295 294.18 (H)  <=100 pg/mL Final    Magnesium 02/13/2022 3.7 (H)  1.6 - 2.6 mg/dL Final    Iron 28/41/3244 31 (L)  50 - 170 ug/dL Final    TIBC 12/30/7251 345  250 - 425 ug/dL Final    Iron Saturation (%) 02/13/2022 9 (L)  20 - 55 % Final    Ferritin 02/13/2022 13.9  7.3 - 270.7 ng/mL Final    WBC 02/13/2022 8.0  3.6 - 11.2 10*9/L Final    RBC 02/13/2022 3.77 (L)  3.95 - 5.13 10*12/L Final    HGB 02/13/2022 9.4 (L)  11.3 - 14.9 g/dL Final    HCT 66/44/0347 30.2 (L)  34.0 - 44.0 % Final    MCV 02/13/2022 80.2  77.6 - 95.7 fL Final    MCH 02/13/2022 24.8 (L)  25.9 - 32.4 pg Final    MCHC 02/13/2022 31.0 (L)  32.0 - 36.0 g/dL Final    RDW 42/59/5638 19.3 (H)  12.2 - 15.2 % Final    MPV 02/13/2022 7.9  6.8 - 10.7 fL Final    Platelet 02/13/2022 275  150 - 450 10*9/L Final       Lab Results   Component Value Date    PRO-BNP 3,420.0 (H) 07/03/2021    PRO-BNP 905.0 (H) 03/21/2021    PRO-BNP 3,796.0 (H) 12/23/2020    Creatinine 2.84 (H) 05/21/2022    Creatinine 2.55 (H) 05/13/2022    Creatinine 0.79  01/06/2011    BUN 52 (H) 05/21/2022    BUN 66 (H) 05/13/2022    BUN 20 01/06/2011    Sodium 139 05/21/2022    Sodium 140 01/06/2011    Potassium 5.0 (H) 05/21/2022    Potassium 4.1 01/06/2011    CO2 25.7 05/21/2022    CO2 30 01/06/2011    Magnesium 3.3 (H) 05/13/2022    Magnesium 2.0 01/06/2011    Total Bilirubin 0.3 05/21/2022    INR 2.15 07/01/2021    INR 1.0 01/06/2011       Lab Results   Component Value Date    Digoxin Level 0.9 10/01/2020       Lab Results   Component Value Date    TSH 2.140 05/13/2022    Cholesterol 95 12/06/2021    Triglycerides 66 12/06/2021    HDL 42 12/06/2021    Non-HDL Cholesterol 53 (L) 12/06/2021    LDL Calculated 40 12/06/2021       Lab Results   Component Value Date    WBC 8.0 05/21/2022    WBC 12.5 (H) 01/06/2011    HGB 11.7 05/21/2022    HGB 12.9 01/06/2011    HCT 37.8 05/21/2022    HCT 40.8 01/06/2011    Platelet 179 05/21/2022    Platelet 260 01/06/2011       Pertinent Test Results from Today:  None    Other pertinent records were reviewed.    The following are further history from the patient's EPIC record for reference:     Past Medical History:   Diagnosis Date    Acute kidney injury superimposed on chronic kidney disease (CMS-HCC) 10/11/2020    Acute on chronic diastolic (congestive) heart failure (CMS-HCC) 08/23/2020    AKI (acute kidney injury) (CMS-HCC) 04/14/2015    Lab Results  Component  Value  Date     CREATININE  1.90 (H)  06/12/2021     Had a bump in her creatinine when she was taking her diuretics every day.  She is currently taking 40 mg daily of torsemide and 50 mg of spironolactone.  Her volume status is fragile.  Previously when she stopped her diuretic she becomes short of breath.  Plan: We will check her BMP today.  We will likely have to go to 40    Arthritis     Calculus of kidney     Calculus of ureter     CHF (congestive heart failure) (CMS-HCC)     Chronic atrial fibrillation (CMS-HCC) 07/20/2019    COPD (chronic obstructive pulmonary disease) (CMS-HCC)     Diabetes (CMS-HCC)     Gangrenous cholecystitis 10/11/2020    GERD (gastroesophageal reflux disease)     Hydronephrosis     Hypertension     Hyponatremia 10/11/2020    Intermediate coronary syndrome (CMS-HCC) 03/13/2014    Lower extremity edema 09/28/2020    Lumbar stenosis     Microscopic hematuria     Nausea alone     Nephrolithiasis 04/17/2016    Neuropathy     Nocturia     Other chronic cystitis     Pulmonary hypertension (CMS-HCC)     Renal colic     Sleep apnea     Unstable angina pectoris (CMS-HCC) 03/13/2014       Past Surgical History:   Procedure Laterality Date    BACK SURGERY  1995    CARPAL TUNNEL RELEASE Left 2014    HYSTERECTOMY  1971    IR INSERT CHOLECYSTOSMY TUBE PERCUTANEOUS  10/02/2020    IR INSERT CHOLECYSTOSMY TUBE PERCUTANEOUS 10/02/2020 Braulio Conte, MD IMG VIR [facility]    LUMBAR DISC SURGERY      PR REMOVAL GALLBLADDER N/A 10/06/2020    Procedure: CHOLECYSTECTOMY;  Surgeon: Katherina Mires, MD;  Location: MAIN OR [facility];  Service: Trauma    PR RIGHT HEART CATH O2 SATURATION & CARDIAC OUTPUT N/A 09/30/2020    Procedure: Right Heart Catheterization;  Surgeon: Neal Dy, MD;  Location: [facility] CATH;  Service: Cardiology    PR RIGHT HEART CATH O2 SATURATION & CARDIAC OUTPUT N/A 01/09/2021    Procedure: Right Heart Catheterization;  Surgeon: Lesle Reek, MD;  Location: [facility] CATH;  Service: Cardiology

## 2022-06-05 NOTE — Unmapped (Unsigned)
[facility] at [facility]  Pulmonary Hypertension Program                        H. Sherre Lain, MD--Director (Pulmonary)  Denice Bors. [name], MD--Associate Director (Pulmonary)  Freeman Caldron, MD (Cardiology)  Bonney Leitz, MD (Pulmonary)    [name], RN Nurse Coordinator  Claretha Cooper, RN Nurse Coordinator      [phone] office  [phone] fax            [name]  is a 81 y.o. female patient referred for work up and evaluation for pulmonary hypertension, COPD, and chronic hypoxemic respiratory failure.    Assessment:      Barbara Huber is a 81 y.o.female with COPD and chronic hypoxemic respiratory failure. She was previously referred to me for pulmonary hypertension, though this is now clearly dominant left heart/group 2 pathology (with small precapillary component, either secondary to [name] versus COPD).     Overall doing well since last visit, and from a COPD standpoint she has been very well controlled lately, with no exacerbations. Continues to experience daily fatigue and dyspnea, though stable, and more likely related to cardiac pathology than truly COPD. Overall she has benefited from current inhaler plan (non-DPI focused), as well as daytime oxygen/nocturnal NIPPV, and heart failure care. With lack of exacerbations recently, we discussed potentially eliminating ICS, with change of inhalers to Stiolto. She is in agreement.      Plan:         - Continue non-DPI inhaler plan, but will change to Stiolto alone (eliminating ICS).  Albuterol PRN.   - Continue Trilogy plus oxygen (4LPM) at night unchanged. She is benefiting from AVAPS settings, and she will let me know if she is needing any new orders from DME etc. I had previously discussed testing options with sleep lab, if this would be needed to adjust settings and/or qualify patient for higher flow oxygen concentrator (above 5LPM). She would be able to have sleep study using her Trilogy/AVAPS settings, would just require specific planning to also include RT presence, and patient would need to bring home Trilogy with her. Will hold off on this for now given improvements.  - Discussed formal PR at any point in the future, can continue to discuss.  - Close followup with heart failure team (seeing Derwood Kaplan today).        Followup in 6 months.       Barbara Byars, MD  06/05/22  Subjective:        81 y.o. 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 (pended).)  - Smoking history: former, 30 pack years, quit 11/2018     PAH treatment history:  None    Oxygen use: 3LPM continuous during the day, and 4LPM bled in with Trilogy at night    Initial visit 08/2019: Patient seen for new pulmonary evaluation for [facility] and COPD, previously being seen in [facility] system by Dr. Meredeth Ide. Recalls COPD diagnosis dating back more than 10 years ago, no history of asthma or other lung conditions. Overall it sounds like she has been maintained  on triple inhaler therapy (Advair diskus, Spiriva handihaler), with exacerbations averaging maybe once/year, usually not requiring hospitalization. Albuterol PRN via MDI (tries to use BID by routine) or neb (rare use). In 06/2019 she was admitted to [facility] with left sided pneumonia, also treated for COPD exacerbation with steroids during this, course also complicated by AKI and Afib/RVR. Hypercapnic during admission and started on Trilogy for discharge. She also carries diagnosis of CHF, made a few years ago, at least 2 admissions (usually [place]) for diuresis. With recent admission did have very elevated proBNPs and discharged on higher dose of Lasix (40 mg daily). Denies recent edema on this higher dose of diuretic. No orthopnea. She has not been doing a lot of physical activity during COVID, will notice very mild dyspnea with self care, no exertional CP or LH. Does find neuropathy can hinder her activity as well. Occasional cough when not having exacerbation, clear sputum. Did have hemoptysis with pneumonia, none outside of this. With first visit we discussed recommendation to repeat CXR, and also repeat TTE in stable outpatient state.    Subsequent history: At visit 10/2019 doing okay, occasional episodes of DOE but overall happy with level of function. Discussed potential for clinical trial enrollment, but she was feeling very well and did not want to do invasive testing unless changing. Admitted to [facility] 04/2020 for dyspnea and decompensated heart failure, diuresed and also given IV iron. At followup 06/2020 not quite back at baseline, but improving steadily. She was then admitted 07/2020 again for heart failure, and then again in 09/2020 for two weeks with decompensated heart failure, Afib/RVR, septic shock, and gangrenous cholecystitis. Shortly thereafter she was diagnosed with ATTR cardiac amyloid. Admitted 12/2020 with dyspnea and decompensated heart failure, diuresed and underwent DCCV. At followup that month slowing improving since discharge, though more hypoxemic than baseline, discussed anemia/iron and changed inhalers to non-DPI formulations. At followup 01/2021 feeling much better, more active, started tafamidis, no additional changes made. At followup 04/2021 doing well, spirometry improved, no changes made. At followup 10/2021 doing okay, no changes. At followup 05/2022 doing well, brief recent ED stay for low sats which was due to [place] malfunction, changed Advair/Spiriva to Stiolto to get rid of ICS.    Interval history 05/2022: Since last visit  Overall doing fairly well.   Hospitalization for heart failure in 11/2021, otherwise has been home.  Brief ED presentation last month for lower SpO2, turned out due to [place] problem.  Oxygen and Trilogy otherwise going okay. Benefiting from use of both.  Some days with good amount of energy, others less so.  Edema and weights carefully monitored, will take extra diuretic dose if needed.  Still on Advair/Spiriva, no recent exacerbations.       Past medical, past surgical, family, and social histories reviewed and updated in Epic.    Review of systems is significant for:   A 12 point review of systems was negative except for pertinent items noted in the HPI.    Allergies   Allergen Reactions   ??? Nitrofurantoin Anaphylaxis and Other (See Comments)   ??? Lipitor [Atorvastatin] Muscle Pain     SOB, Headache, fatigue, sick on my stomach      Current Outpatient Medications   Medication Sig Dispense Refill   ??? ACCU-CHEK AVIVA PLUS TEST STRP Strp UESE TO CHECK BLOOD SUGAR 3 TIMES A DAY BEFORE MEALS 100 each 11   ??? acetaminophen (TYLENOL) 500 MG tablet Take 2 tablets (1,000 mg total) by mouth daily as  needed for pain.     ??? ADVAIR HFA 115-21 mcg/actuation inhaler INHALE TWO PUFFS BY MOUTH TWICE A DAY 12 g 11   ??? albuterol HFA 90 mcg/actuation inhaler Inhale 2 puffs every eight (8) hours as needed for wheezing.     ??? amiodarone (PACERONE) 200 MG tablet Take 1 tablet (200 mg total) by mouth daily. 90 tablet 3   ??? cranberry 500 mg cap Take 500 mg by mouth daily with evening meal.     ??? estradioL (ESTRACE) 0.01 % (0.1 mg/gram) vaginal cream INSERT ONE GRAM VAGINALLY AT BEDTIME     ??? ferrous sulfate 325 (65 FE) MG tablet Take 1 tablet (325 mg total) by mouth every other day. 45 tablet 3   ??? fosfomycin (MONUROL) 3 gram Pack Take 3 g by mouth once a week. 36 g 3   ??? gabapentin (NEURONTIN) 300 MG capsule Take 1 capsule (300 mg total) by mouth two (2) times a day. 180 capsule 3   ??? insulin glargine (BASAGLAR, LANTUS) 100 unit/mL (3 mL) injection pen Inject 0.04 mL (4 Units total) under the skin nightly. 15 mL 3   ??? lancets Misc 1 each by Miscellaneous route daily. Accu Check 100 each 6   ??? montelukast (SINGULAIR) 10 mg tablet Take 1 tablet (10 mg total) by mouth in the morning. 90 tablet 2   ??? NARCAN 4 mg/actuation nasal spray 1 spray into alternating nostrils once as needed (opioid overdose). PRN - Emergency use.     ??? NON FORMULARY APPLY FROM NECK DOWN TWICE A DAY     ??? OXYCONTIN 10 mg TR12 12 hr crush resistant ER/CR tablet Take 1 tablet (10 mg total) by mouth every twelve (12) hours.     ??? OXYGEN-AIR DELIVERY SYSTEMS MISC 5 L by Miscellaneous route. Currently using 3  L/min via [place]     ??? pantoprazole (PROTONIX) 20 MG tablet Take 1 tablet (20 mg total) by mouth in the morning. 60 tablet 5   ??? 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 rosuvastatin (CRESTOR) 5 MG tablet Take 1 tablet (5 mg total) by mouth every other day. 15 tablet 11   ??? sertraline (ZOLOFT) 25 MG tablet Take 1 tablet (25 mg total) by mouth daily. 90 tablet 3   ??? SPIRIVA RESPIMAT 2.5 mcg/actuation inhalation mist INHALE TWO PUFFS BY MOUTH ONCE DAILY 4 g 11   ??? spironolactone (ALDACTONE) 25 MG tablet Take 1 tablet (25 mg total) by mouth daily.     ??? tafamidis (VYNDAMAX) 61 mg cap Take 1 capsule (61 mg) by mouth daily. 30 capsule 11   ??? torsemide (DEMADEX) 20 MG tablet Take 2 tablets (40 mg total) by mouth daily. May also take 1 tablet (20 mg total) daily as needed (weight gain for 3lbs overnight or 5lbs in a week). 180 tablet 3   ??? varicella-zoster [id number], PF, (SHINGRIX, PF,) 50 mcg/0.5 mL SusR injection Inject 0.5 mL into the muscle. 0.5 mL 1   ??? metOLazone (ZAROXOLYN) 5 MG tablet Take 1 tablet (5 mg total) by mouth daily as needed (when instructed by cardiology clinic). 30 tablet 1     No current facility-administered medications for this visit.   f        Objective:   Objective     Physical Exam:   BP 113/57 (BP Site: L  Arm, BP Position: Sitting)  - Pulse 56 - Temp 36.4 ??C (97.5 ??F) (Temporal)  - Wt 98 kg (216 lb)  - SpO2 90%  - BMI 35.94 kg/m??   Body mass index is 35.94 kg/m?? (pended).    Wt Readings from Last 6 Encounters:   06/05/22 98 kg (216 lb)   05/22/22 97 kg (213 lb 12.8 oz)   05/21/22 97.5 kg (215 lb)   05/13/22 95.3 kg (210 lb)   05/13/22 96.2 kg (212 lb)   04/23/22 96.2 kg (212 lb)     General Appearance:    Alert, cooperative, no distress, appears stated age.   HEENT:    Normocephalic, without obvious abnormality, atraumatic. PERRL, conjunctiva clear. Nares normal, no drainage, no sinus tenderness. Oropharynx with no lesions. No facial telangectasias.   Neck:    Supple, trachea midline, no adenopathy.    Lungs:    Distant breath sounds b/l, no crackles or wheezes, respirations unlabored. No PA bruits.   Chest wall:   Expands symmetrically, no tenderness or deformity.   Cardiovascular:   Regular rate and rhythm, S1 and S2 normal, P2 not overly prominent, no murmur, rubs, or gallop appreciated. Neck exam with prominent carotid impulse, JVD not clearly visible when upright.   Abdomen:   Soft, non-tender, normal bowel sounds, no masses, no organomegaly   Extremities:     Extremities normal, no cyanosis, clubbing. Trace to 1+ pitting edema, similar. No sclerodactyly.   Skin:   No rashes or lesions.   Neuro/psych:      No focal neurologic deficits; mood/affect normal.     Diagnostic Review:    Cardiac testing summary:  TTE 12/09/21: EF >70%, gr 3DD, sev LVH       RV mild dil, nl fxn       No effusion. IVC dil/poor insp collapse  TTE 04/24/21:   RHC 01/09/21: RA 23, PA 85/40 (55), PCW 32       CO/CI 5.3/2.5 (f,td), PA sat 57%, PVR 4.2 WU  TTE 09/2020: LA mild dil, EF >55%       RV ULN size, HK, tr TR, can't est PASP       No effusion. IVC dil, poor insp collapse  TTE 04/2020: LA 41, EF >55%, LVH (16)       RV normal, mild HK, TAPSE 25, can't est PASP       No effusion. IVC normal.  TTE 10/2019: LA 42, EF >55%       RV mild dil/HK, can't est PASP       No effusion. IVC dil, poor insp collapse  TTE 06/2019: LA 38, EF 65-70%, grade 2 DD, LVH (13-14)       RV mild dil, low nl fxn, TAPSE 17, mild-mod Tr, est PASP 64+CVP       Tr effusion. IVC nl size, poor insp collapse  TTE 04/2018 (cone): LA 40, EF 60-65%, grade 1 DD       RV normal, mild TR, est PASP within the normal range       No effusion. IVC normal    CXR 05/21/22 (reviewed): Similar bilat mid and lower lung opacities.        Small bilateral pleural effusions.        Stable enlarged cardiac silhouette.   CXR 08/2020: cardiomegaly       Pulmonary vascular congestion with increased markings       Hyperinflated lungs, flat diagphragms  CXR 04/2020: cardiomegaly  Hazy interstitial opacities with vascular congestion       Likely trace L effusion.  CXR 08/2019:  stable cardiomegaly       Interval resolution of LLL consolidation.       Stable parenchymal scarring in the left midlung zone.       Interval decreased now trace left pleural effusion.    CTA chest 10/2018 (cone): Cardiovascular: There is no demonstrable pulmonary embolus. There is  no thoracic aortic aneurysm or dissection. There is calcification at  the origin of the left subclavian artery. Other visualized great  vessels appear unremarkable. There is aortic atherosclerosis. There  are foci of coronary artery calcification evident. There is a small  amount of pericardial fluid, within the physiologic range, stable.  Pericardium does not appear appreciably thickened.  Prominence of the main pulmonary outflow tract is noted with a  measured diameter of 3.7 cm.  Mediastinum/Nodes: There is a subcentimeter nodular opacity in the  left lobe of the thyroid, stable. There are multiple subcentimeter  mediastinal lymph nodes again noted. There are prominent right  paratracheal lymph nodes with largest lymph node in this area  measuring 1.8 x 1.4 cm, marginally smaller than on prior study.  Aortopulmonary window lymph nodes appear slightly smaller than on  previous study. No new lymph node prominence is evident on this  study. No esophageal lesions are evident.  Lungs/Pleura: There is atelectatic change in the lung base regions,  stable. There is no frank edema or consolidation. A mild degree of  centrilobular emphysematous change appears stable. No appreciable  pleural effusion.  Upper Abdomen: There is a cyst in the medial upper pole right kidney  measuring 1.5 x 1.5 cm. A cyst is noted in the lateral mid left  kidney measuring 1.5 x 1.3 cm. There is aortic atherosclerosis as  well as calcification in the proximal visualized great vessels.  Visualized upper abdominal structures otherwise appear unremarkable.  Musculoskeletal: There is stable anterior wedging of the L1  vertebral body. There is multilevel degenerative change in the  thoracic spine which appear stable. No blastic or lytic bone lesions  are evident. No evident chest wall lesions.     CT chest 04/2018 (cone): Cardiovascular: Mild cardiomegaly. Small pericardial  effusion/thickening. Left anterior descending and right coronary  atherosclerosis. Atherosclerotic nonaneurysmal thoracic aorta.  Dilated main pulmonary artery (3.6 cm diameter). No central  pulmonary emboli.  Mediastinum/Nodes: Subcentimeter hypodense posterior left thyroid  lobe nodule. Unremarkable esophagus. No axillary adenopathy.  Enlarged right paratracheal nodes up to 1.6 cm (series 2/image 54).  Enlarged 1.1 cm AP window node (series 2/image 51). No  pathologically enlarged hilar nodes.  Lungs/Pleura: No pneumothorax. Trace dependent left pleural  effusion. No right pleural effusion. No acute consolidative airspace  disease, lung masses or significant pulmonary nodules. Minimal  interlobular septal thickening throughout both lungs. Several mildly  thickened parenchymal bands in the dependent right middle lobe,  lingula and right lower lobe. No significant regions of  bronchiectasis. Mild centrilobular emphysema with mild diffuse  bronchial wall thickening.  Upper abdomen: Partially visualized simple 1.6 cm medial upper right  renal cyst. Mild scarring in the visualized upper right kidney.  Musculoskeletal: No aggressive appearing focal osseous lesions.  Stable mild chronic L1 vertebral compression fracture. Moderate  thoracic spondylosis.     V/Q scan : none    08/2019: 152 meters. HR 79->101, O2 sat 91->88% on 3LPM, needing 4LPM. Max Borg 2.    Pulmonary Function Testing:  Date FEV1  FVC TLC FRC RV DLCO    04/2021  1.12 (66%)  1.91 (86%)        08/2019  0.99 (56%)  1.83 (79%)     5.0 (26%)    04/2018  0.98 (59%)  1.88 (89%)  68%   63%   (29%)                Overnight oximetry 01/2021 (NIPPV/5L): 48 min <89%, avg 90%, nadir 81%, ODI 15  Overnight oximetry 08/2019: 20 min <89% (total time 6.5 hours), avg 90%, nadir 94%       Compliance data with very good usage  PSG : done in past but not available     Relevant Serologies:   Labs 08/2019: ANA pos 1:160 homogenous, ENA neg, RF/CCP neg  Labs 12/2018: ANCA neg, ACE 31    Routine Labs:        Date NTproBNP Creatinine  Potassium  Bicarbonate  Hemoglobin  abs eos             04/2022 BNP 200 2.6   31 11.7     07/2021 BNP 445 2.6   30  9.7  300    03/2021 BNP 567 1.5 4.1  31  8.2  200    12/2020 BNP 926->479 1.3 3.8  33  8.9     11/2020  3800   BNP 567              07/2020  BNP 603              05/2020  1970  1.3  4.3  32  11.2     10/2019  2220  1.1    34        08/2019  1410              07/2019  1270  0.9  4.1  29  10.0     06/2019  6600->13K             11/2018  BNP 253              08/2018  BNP 531 34        08/2019  1410              07/2019  1270  0.9  4.1  29  10.0     06/2019  6600->13K             11/2018  BNP 253              08/2018  BNP 531

## 2022-06-08 MED FILL — VYNDAMAX 61 MG CAPSULE: ORAL | 30 days supply | Qty: 30 | Fill #5

## 2022-06-09 ENCOUNTER — Ambulatory Visit: Admit: 2022-06-09 | Discharge: 2022-06-10 | Payer: MEDICARE | Attending: Adult Health | Primary: Adult Health

## 2022-06-09 DIAGNOSIS — I5032 Chronic diastolic (congestive) heart failure: Principal | ICD-10-CM

## 2022-06-09 DIAGNOSIS — R944 Abnormal results of kidney function studies: Principal | ICD-10-CM

## 2022-06-09 LAB — COMPREHENSIVE METABOLIC PANEL
ALBUMIN: 4 g/dL (ref 3.4–5.0)
ALKALINE PHOSPHATASE: 93 U/L (ref 46–116)
ALT (SGPT): 8 U/L — ABNORMAL LOW (ref 10–49)
ANION GAP: 6 mmol/L (ref 5–14)
AST (SGOT): 15 U/L (ref <=34)
BILIRUBIN TOTAL: 0.5 mg/dL (ref 0.3–1.2)
BLOOD UREA NITROGEN: 72 mg/dL — ABNORMAL HIGH (ref 9–23)
BUN / CREAT RATIO: 20
CALCIUM: 8.6 mg/dL — ABNORMAL LOW (ref 8.7–10.4)
CHLORIDE: 97 mmol/L — ABNORMAL LOW (ref 98–107)
CO2: 33 mmol/L — ABNORMAL HIGH (ref 20.0–31.0)
CREATININE: 3.6 mg/dL — ABNORMAL HIGH
EGFR CKD-EPI (2021) FEMALE: 12 mL/min/{1.73_m2} — ABNORMAL LOW (ref >=60)
GLUCOSE RANDOM: 160 mg/dL (ref 70–179)
POTASSIUM: 4.6 mmol/L (ref 3.4–4.8)
PROTEIN TOTAL: 7.6 g/dL (ref 5.7–8.2)
SODIUM: 136 mmol/L (ref 135–145)

## 2022-06-09 LAB — B-TYPE NATRIURETIC PEPTIDE: B-TYPE NATRIURETIC PEPTIDE: 556.31 pg/mL — ABNORMAL HIGH (ref <=100)

## 2022-06-09 LAB — MAGNESIUM: MAGNESIUM: 4.2 mg/dL — ABNORMAL HIGH (ref 1.6–2.6)

## 2022-06-09 MED ADMIN — furosemide (LASIX) injection 120 mg: 120 mg | INTRAVENOUS | @ 19:00:00 | Stop: 2022-06-09

## 2022-06-09 NOTE — Unmapped (Unsigned)
[name] Note    Primary Cardiologist/Cardiology Provider(s):  Dr Wonda Cheng  PCP:  Jacquiline Doe, MD    Last Cardiology Clinic Visit:  06/20/2021  Last [name] Visit: 07/14/21    Reason for Visit:  Barbara Huber is a 81 y.o. female with a history of diastolic heart failure, who is being seen today in the [facility] [name] for an urgent visit for volume status optimization including [name].    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   - Volume status today is improved.  Baseline weight of 214-215lbs. Wt 7/14: 226 lbs Today wt: 217 lbs 9.6oz  - She has LE edema which is improving.  No shortness of breath or dyspnea.   - NYHA Class I-II  - IV Lasix was not administered during clinic due to volume status improving (with two doses of metolazone), only leg swelling without other signs or symptoms and Cr pending.  - Labs were obtained: (pending)    (Baseline Cr since June is around 2.4, prior Cr 1.3-1.6, nephrology appt 8/31)  - Home meds:   - Continue torsemide 40mg  BID until weight is 215lbs or less   - Metolazone 5mg  PRN (call office or message via MyChart if increase in weight by 5lbs)  - Continue spironolactone 25mg  daily for now - if K is elevated in today's labs will stop medication.     2.  Constipation  - likely multifactorial (diuretics, Fe, and oxycodone)  - Taking senna 2 pills twice a day and miralax w/o relief  -Not on any Mg  -Will continue senna 2 pills twice a day, and add dulcolax daily until bowels are regular then stop.   -Could consider lactulose if no improvement    Response to [name]:         Medications Heart rate Blood pressure Weight (lbs)   Pre  69 104/69 217lbs 9.6 oz   Hour 0 IV Lasix 0 mg      Hour 2 IV Lasix 0 mg      Post         Output:  I/O         06/11 0701  06/12 0700 06/12 0701  06/13 0700 06/13 0701  06/14 0700    Urine (mL/kg/hr)   200 MD [facility]   08/19/2022  1:30 PM [name] Karmen Stabs, AGNP [facility]RT[facility]AS[facility]   11/13/2022 11:30 AM Carin Hock, MD Janeann Forehand [facility]   12/04/2022 12:00 PM Zannie Cove, MD UNCPULSPCLET [facility]       History of Present Illness:  Barbara Huber is a 81 y.o. female with a history of diastolic heart failure who presents today for [name]. She has not been feeling well. Weight at home has been 217lbs since Sunday. She last took metolazone on Sunday but it didn't seem to do anything. Yesterday, she took an extra 20mg  torsemide on top of her usual 40mg . She didn't take anything this morning because she wasn't sure what was going on. She has been feel weak, sleepy, and having jerking again of his arms. They cut back her gabapentin from 300mg  BID to just 300mg  daily. Her appetite is poor. She doesn't feel SOB. She has a little ankle swelling on the left. She felt pretty good around 213lbs.       Past Medical History:  Diagnosis Date    Acute kidney injury superimposed on chronic kidney disease (CMS-HCC) 10/11/2020    Acute on chronic diastolic (congestive) heart failure (CMS-HCC) 08/23/2020    AKI (acute kidney injury) (CMS-HCC) 04/14/2015    Lab Results  Component  Value  Date     CREATININE  1.90 (H)  06/12/2021     Had a bump in her creatinine when she was taking her diuretics every day.  She is currently taking 40 mg daily of torsemide and 50 mg of spironolactone.  Her volume status is fragile.  Previously when she stopped her diuretic she becomes short of breath.  Plan: We will check her BMP today.  We will likely have to go to 40    Arthritis     Calculus of kidney     Calculus of ureter     CHF (congestive heart failure) (CMS-HCC)     Chronic atrial fibrillation (CMS-HCC) 07/20/2019    COPD (chronic obstructive pulmonary disease) (CMS-HCC)     Diabetes (CMS-HCC)     Gangrenous cholecystitis 10/11/2020    GERD (gastroesophageal reflux disease)     Hydronephrosis 10/11/2020    GERD (gastroesophageal reflux disease)     Hydronephrosis     Hypertension     Hyponatremia 10/11/2020    Intermediate coronary syndrome (CMS-HCC) 03/13/2014    Lower extremity edema 09/28/2020    Lumbar stenosis     Microscopic hematuria     Nausea alone     Nephrolithiasis 04/17/2016    Neuropathy     Nocturia     Other chronic cystitis     Pulmonary hypertension (CMS-HCC)     Renal colic     Sleep apnea     Unstable angina pectoris (CMS-HCC) 03/13/2014     Past Surgical History:   Procedure Laterality Date    BACK SURGERY  1995    CARPAL TUNNEL RELEASE Left 2014    HYSTERECTOMY  1971    IR INSERT CHOLECYSTOSMY TUBE PERCUTANEOUS  10/02/2020    IR INSERT CHOLECYSTOSMY TUBE PERCUTANEOUS 10/02/2020 Braulio Conte, MD IMG VIR H&V [facility]    LUMBAR DISC SURGERY      PR REMOVAL GALLBLADDER N/A 10/06/2020    Procedure: CHOLECYSTECTOMY;  Surgeon: Katherina Mires, MD;  Location: MAIN OR [facility];  Service: Trauma    PR RIGHT HEART CATH O2 SATURATION & CARDIAC OUTPUT N/A 09/30/2020    Procedure: Right Heart Catheterization;  Surgeon: Neal Dy, MD;  Location: [facility] CATH;  Service: Cardiology    PR RIGHT HEART CATH O2 SATURATION & CARDIAC OUTPUT N/A 01/09/2021    Procedure: Right Heart Catheterization;  Surgeon: Lesle Reek, MD;  Location: [facility] CATH;  Service: Cardiology       Current Outpatient Medications   Medication Sig Dispense Refill    ACCU-CHEK AVIVA PLUS TEST STRP Strp UESE TO CHECK BLOOD SUGAR 3 TIMES A DAY BEFORE MEALS 100 each 11    acetaminophen (TYLENOL) 500 MG tablet Take 2 tablets (1,000 mg total) by mouth daily as needed for pain.      albuterol HFA 90 mcg/actuation inhaler Inhale 2 puffs every eight (8) hours as needed for wheezing.      amiodarone (PACERONE) 200 MG tablet Take 1 tablet (200 mg total) by mouth daily. 90 tablet 3    cranberry 500 mg cap Take 500 mg by mouth daily with evening meal.      estradioL (ESTRACE) 0.01 % (0.1 mg/gram) vaginal cream INSERT ONE GRAM VAGINALLY AT  BEDTIME      ferrous sulfate 325 (65 FE) MG tablet Take 1 tablet (325 mg total) by mouth every other day. 45 tablet 3    fosfomycin (MONUROL) 3 gram Pack Take 3 g by mouth once a week. 36 g 3    gabapentin (NEURONTIN) 300 MG capsule Take 1 capsule (300 mg total) by mouth two (2) times a day. 180 capsule 3    insulin glargine (BASAGLAR, LANTUS) 100 unit/mL (3 mL) injection pen Inject 0.04 mL (4 Units total) under the skin nightly. 15 mL 3    lancets Misc 1 each by Miscellaneous route daily. Accu Check 100 each 6    metOLazone (ZAROXOLYN) 5 MG tablet Take 1 tablet (5 mg total) by mouth daily as needed (when instructed by cardiology clinic). 30 tablet 1    montelukast (SINGULAIR) 10 mg tablet Take 1 tablet (10 mg total) by mouth in the morning. 90 tablet 2    NARCAN 4 mg/actuation nasal spray 1 spray into alternating nostrils once as needed (opioid overdose). PRN - Emergency use.      NON FORMULARY APPLY FROM NECK DOWN TWICE A DAY      OXYCONTIN 10 mg TR12 12 hr crush resistant ER/CR tablet Take 1 tablet (10 mg total) by mouth every twelve (12) hours.      OXYGEN-AIR DELIVERY SYSTEMS MISC 5 L by Miscellaneous route. Currently using 3  L/min via [place]      pantoprazole (PROTONIX) 20 MG tablet Take 1 tablet (20 mg total) by mouth in the morning. 60 tablet 5    pen needle, diabetic (PEN NEEDLE) 31 gauge x 5/16 (8 mm) Ndle Injection Frequency is 1 time per day; Dx Code: Type 2 Diabetes uncontrolled (E11.65) 50 each 11    rosuvastatin (CRESTOR) 5 MG tablet Take 1 tablet (5 mg total) by mouth every other day. 15 tablet 11    sertraline (ZOLOFT) 25 MG tablet Take 1 tablet (25 mg total) by mouth daily. (Patient not taking: Reported on 06/05/2022) 90 tablet 3    spironolactone (ALDACTONE) 25 MG tablet Take 1 tablet (25 mg total) by mouth daily.      tafamidis (VYNDAMAX) 61 mg cap Take 1 capsule (61 mg) by mouth daily. 30 capsule 11    tiotropium-olodateroL 2.5-2.5 mcg/actuation Mist Inhale 2 puffs daily. 4 g 11 torsemide (DEMADEX) 20 MG tablet Take 2 tablets (40 mg total) by mouth daily. May also take 1 tablet (20 mg total) daily as needed (weight gain for 3lbs overnight or 5lbs in a week). 180 tablet 3    varicella-zoster [id number], PF, (SHINGRIX, PF,) 50 mcg/0.5 mL SusR injection Inject 0.5 mL into the muscle. 0.5 mL 1     No current facility-administered medications for this visit.     Allergies   Allergen Reactions    Nitrofurantoin Anaphylaxis and Other (See Comments)    Lipitor [Atorvastatin] Muscle Pain     SOB, Headache, fatigue, sick on my stomach       Objective:       Physical Exam  BP 98/46  - Pulse 60  - Ht 167.6 cm (5' 6)  - Wt 98.4 kg (217 lb)  - SpO2 93%  - BMI 35.02 kg/m??    Wt Readings from Last 12 Encounters:   06/09/22 98.4 kg (217 lb)   06/05/22 98.6 kg (217 lb 6.4 oz)   06/05/22 98 kg (216 lb)   05/22/22 97 kg (213 lb 12.8 oz)   05/21/22 97.5 kg (215  lb)   05/13/22 95.3 kg (210 lb)   05/13/22 96.2 kg (212 lb)   04/23/22 96.2 kg (212 lb)   03/13/22 95.3 kg (210 lb)   02/19/22 96.2 kg (212 lb)   02/13/22 95.7 kg (211 lb)   01/22/22 94.8 kg (209 lb)       General:  Patient is well-appearing in no acute distress   Eyes:  Intact, sclerae anicteric.   Ears, nose, mouth: Benign   Respiratory:   Normal respiratory effort. Clear to auscultation bilaterally.  There are no wheezes. Nasal cannula.    Cardiovascular:   HJR with sitting at 90 degrees.  Rate and rhythm are regular.  There is no lifts or heaves.  Normal S1, S2. There is no murmur, gallops or rubs.  Radial and pedal pulses are 2+, bilaterally.   There is +1-2 bilateral to mid tib/fib area.     Gastrointestinal:   Soft, non-tender, with active audible bowel sounds. Abdomen nondistended.  Liver is nonpalpable.   Musculoskeletal: No joint swelling   Skin: Warm, well perfused.   Neurologic: Appropriate mood and affect. Alert and oriented to person, place, and time. No gross motor or sensory deficits evident.     Recent Labs:  Office Visit on 06/09/2022 Component Date Value Ref Range Status    Sodium 06/09/2022 136  135 - 145 mmol/L Final    Potassium 06/09/2022 4.6  3.4 - 4.8 mmol/L Final    Chloride 06/09/2022 97 (L)  98 - 107 mmol/L Final    CO2 06/09/2022 33.0 (H)  20.0 - 31.0 mmol/L Final    Anion Gap 06/09/2022 6  5 - 14 mmol/L Final    BUN 06/09/2022 72 (H)  9 - 23 mg/dL Final    Creatinine 16/09/9603 3.60 (H)  0.60 - 0.80 mg/dL Final    BUN/Creatinine Ratio 06/09/2022 20   Final    eGFR [name]-EPI (2021) Female 06/09/2022 12 (L)  >=60 mL/min/1.35m2 Final    Glucose 06/09/2022 160  70 - 179 mg/dL Final    Calcium 54/08/8118 8.6 (L)  8.7 - 10.4 mg/dL Final    Albumin 14/78/2956 4.0  3.4 - 5.0 g/dL Final    Total Protein 06/09/2022 7.6  5.7 - 8.2 g/dL Final    Total Bilirubin 06/09/2022 0.5  0.3 - 1.2 mg/dL Final    AST 21/30/8657 15  <=34 U/L Final    ALT 06/09/2022 8 (L)  10 - 49 U/L Final    Alkaline Phosphatase 06/09/2022 93  46 - 116 U/L Final    Magnesium 06/09/2022 4.2 (H)  1.6 - 2.6 mg/dL Final   Office Visit on 06/05/2022   Component Date Value Ref Range Status    Sodium 06/05/2022 137  135 - 145 mmol/L Final    Potassium 06/05/2022 4.7  3.4 - 4.8 mmol/L Final    Chloride 06/05/2022 101  98 - 107 mmol/L Final    CO2 06/05/2022 29.5  20.0 - 31.0 mmol/L Final    Anion Gap 06/05/2022 7  5 - 14 mmol/L Final    BUN 06/05/2022 58 (H)  9 - 23 mg/dL Final    Creatinine 84/69/6295 3.23 (H)  0.60 - 0.80 mg/dL Final    BUN/Creatinine Ratio 06/05/2022 18   Final    eGFR [name]-EPI (2021) Female 06/05/2022 14 (L)  >=60 mL/min/1.38m2 Final    Glucose 06/05/2022 155  70 - 179 mg/dL Final    Calcium 28/41/3244 8.7  8.7 - 10.4 mg/dL Final    BNP  06/05/2022 451.18 (H)  <=100 pg/mL Final    Magnesium 06/05/2022 3.9 (H)  1.6 - 2.6 mg/dL Final       Lab Results   Component Value Date    PRO-BNP 3,420.0 (H) 07/03/2021    PRO-BNP 905.0 (H) 03/21/2021    PRO-BNP 3,796.0 (H) 12/23/2020    PRO-BNP 1,970.0 (H) 05/30/2020    PRO-BNP 3,200.0 (H) 04/27/2020    Creatinine 3.60 (H) 06/09/2022    Creatinine 3.23 (H) 06/05/2022    Creatinine 2.84 (H) 05/21/2022    Creatinine 2.55 (H) 05/13/2022    Creatinine 2.33 (H) 03/13/2022    Creatinine 0.79 01/06/2011    BUN 72 (H) 06/09/2022    BUN 58 (H) 06/05/2022    BUN 52 (H) 05/21/2022    BUN 66 (H) 05/13/2022    BUN 59 (H) 03/13/2022    BUN 20 01/06/2011    Magnesium 4.2 (H) 06/09/2022    Magnesium 3.9 (H) 06/05/2022    Magnesium 3.3 (H) 05/13/2022    Magnesium 3.0 (H) 03/13/2022    Magnesium 3.7 (H) 02/13/2022    Magnesium 2.0 01/06/2011       Metric Tracker:  Did today's visit result in [name]? No  Did today's visit result in hospital admission? No  Did today's visit result in referral to cardiology? n/a  Today's visit was a referral from Internal Medicine

## 2022-06-09 NOTE — Unmapped (Signed)
[name] Clinic Discharge Instructions:    MEDICATIONS:  We are changing your medications today.  - Increase torsemide to 40mg  twice a day until weight is less than 214lbs.   - If you feel worse with more torsemide, then hold it all together.   - Hold the gabapentin all together for now  Call if you have questions about your medications.    IMPORTANT INSTRUCTIONS:  - Weigh yourself daily in the morning; write your weight down and bring it with you next time   - Limit your fluid intake to 2 Liters (half-gallon) per day    - Limit your salt intake to 2-3 grams (2000-3000 mg) per day  - If you have a morning diuresis appointment, don't take your morning diuretic  - If you have an afternoon diuresis appointment, take your morning diuretic    LABS:  We will call you if your labs need attention    NEXT APPOINTMENT:  Return to clinic in 2 days with [name]      My office number (c/o De Burrs RN) is [phone] if you need further assistance, or need to schedule with our [name] clinic in the future.    If you need to reschedule future appointments, please call [phone].   After office hours, if you have urgent questions/problems, contact the on-call cardiologist through the hospital operator: [phone].    Please do not send a MyChart message for potentially life-threatening symptoms or time-sensitive issues. MyChart messages are not monitored after normal business hours or on weekends. Please call 911 for a true medical emergency.

## 2022-06-11 ENCOUNTER — Ambulatory Visit: Admit: 2022-06-11 | Discharge: 2022-06-11 | Payer: MEDICARE | Attending: Adult Health | Primary: Adult Health

## 2022-06-11 DIAGNOSIS — R944 Abnormal results of kidney function studies: Principal | ICD-10-CM

## 2022-06-11 DIAGNOSIS — I5032 Chronic diastolic (congestive) heart failure: Principal | ICD-10-CM

## 2022-06-11 LAB — BASIC METABOLIC PANEL
ANION GAP: 7 mmol/L (ref 5–14)
BLOOD UREA NITROGEN: 66 mg/dL — ABNORMAL HIGH (ref 9–23)
BUN / CREAT RATIO: 21
CALCIUM: 8.7 mg/dL (ref 8.7–10.4)
CHLORIDE: 98 mmol/L (ref 98–107)
CO2: 30.4 mmol/L (ref 20.0–31.0)
CREATININE: 3.2 mg/dL — ABNORMAL HIGH
EGFR CKD-EPI (2021) FEMALE: 14 mL/min/{1.73_m2} — ABNORMAL LOW (ref >=60)
GLUCOSE RANDOM: 151 mg/dL (ref 70–179)
POTASSIUM: 4.6 mmol/L (ref 3.4–4.8)
SODIUM: 135 mmol/L (ref 135–145)

## 2022-06-11 LAB — MAGNESIUM: MAGNESIUM: 3.6 mg/dL — ABNORMAL HIGH (ref 1.6–2.6)

## 2022-06-11 LAB — B-TYPE NATRIURETIC PEPTIDE: B-TYPE NATRIURETIC PEPTIDE: 292.58 pg/mL — ABNORMAL HIGH (ref <=100)

## 2022-06-11 MED ADMIN — furosemide (LASIX) injection 120 mg: 120 mg | INTRAVENOUS | @ 19:00:00 | Stop: 2022-06-11

## 2022-06-11 NOTE — Unmapped (Signed)
[facility] [place] [name] Note    Primary Cardiologist/Cardiology Provider(s):  Dr Wonda Cheng  PCP:  Jacquiline Doe, MD    Last Cardiology Clinic Visit:  06/20/2021  Last [name] Visit: 07/14/21    Reason for Visit:  Barbara Huber is a 81 y.o. female with a history of diastolic heart failure, who is being seen today in the [facility] [name] Clinic for an urgent visit for volume status optimization including [name].    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  Baseline weight of 212-213lbs. Currently 217lbs. 217.8 --> 216.7   - She has mild LE edema L>R, elevated JVP  - IV Lasix 120 was administered during clinic with good response  - Labs were obtained: Cr 3.6, K 4.6. BNP 556  - Increase torsemide to 40mg  BID until weight is <214lbs  - Hold metolazone for now  - Continue spironolactone 25mg  daily   - Suspect trigger for this exacerbation was IV iron infusions - had very similar presentation w/ hospital admission in 11/2021 after IV iron in the fall.     2.  Hypermagnesemia  - likely in setting of renal dysfunction, Phillip's tablets use (magnesium 500mg ). Stop Phillip's tablets    3. AKI  - Cr increased in setting of volume retention  - hold gabapentin     Response to [name]:         Medications Heart rate Blood pressure Weight (lbs)   Pre       Hour 0 IV Lasix 120 mg      Hour 2 IV Lasix 0 mg      Post         Output:  I/O       None            Return to clinic:    No follow-ups on file.    Future Appointments   Date Time Provider Department Center   06/22/2022  1:00 PM DIURESIS HF NP [place] [facility]   07/22/2022  3:20 PM Artelia Laroche, MD [facility]   08/19/2022  1:30 PM [name] Karmen Stabs, AGNP [facility]   11/13/2022 11:30 AM Carin Hock, MD [facility]   12/04/2022 12:00 PM Zannie Cove, MD [facility]       History of Present Illness:  Barbara Huber is a 81 y.o. female with a history of diastolic heart failure who presents today for [name]. She has not been feeling well. Weight at home has been 217lbs since Sunday. She last took metolazone on Sunday but it didn't seem to do anything. Yesterday, she took an extra 20mg  torsemide on top of her usual 40mg . She didn't take anything this morning because she wasn't sure what was going on. She has been feel weak, sleepy, and having jerking again of his arms. They cut back her gabapentin from 300mg  BID to just 300mg  daily. Her appetite is poor. She doesn't feel SOB. She has a little ankle swelling on the left. She felt pretty good around 213lbs.       Past Medical History:   Diagnosis Date    Acute kidney injury superimposed on chronic kidney disease (CMS-HCC) 10/11/2020    Acute on chronic diastolic (congestive) heart failure (CMS-HCC) 08/23/2020    AKI (acute kidney injury) (CMS-HCC) 04/14/2015    Lab Results  Component  Value  Date     CREATININE  1.90 (H)  06/12/2021     Had a bump in her creatinine when she was taking her diuretics every day.  She is currently taking 40 mg daily of torsemide and 50 mg of spironolactone.  Her volume status is fragile.  Previously when she stopped her diuretic she becomes short of breath.  Plan: We will check her BMP today.  We will likely have to go to 40    Arthritis     Calculus of kidney     Calculus of ureter     CHF (congestive heart failure) (CMS-HCC)     Chronic atrial fibrillation (CMS-HCC) 07/20/2019    COPD (chronic obstructive pulmonary disease) (CMS-HCC)     Diabetes (CMS-HCC)     Gangrenous cholecystitis 10/11/2020    GERD (gastroesophageal reflux disease)     Hydronephrosis     Hypertension     Hyponatremia 10/11/2020    Intermediate coronary syndrome (CMS-HCC) 03/13/2014    Lower extremity edema 09/28/2020    Lumbar stenosis     Microscopic hematuria     Nausea alone     Nephrolithiasis 04/17/2016    Neuropathy     Nocturia Other chronic cystitis     Pulmonary hypertension (CMS-HCC)     Renal colic     Sleep apnea     Unstable angina pectoris (CMS-HCC) 03/13/2014     Past Surgical History:   Procedure Laterality Date    BACK SURGERY  1995    CARPAL TUNNEL RELEASE Left 2014    HYSTERECTOMY  1971    IR INSERT CHOLECYSTOSMY TUBE PERCUTANEOUS  10/02/2020    IR INSERT CHOLECYSTOSMY TUBE PERCUTANEOUS 10/02/2020 Braulio Conte, MD IMG VIR [facility]    LUMBAR DISC SURGERY      PR REMOVAL GALLBLADDER N/A 10/06/2020    Procedure: CHOLECYSTECTOMY;  Surgeon: Katherina Mires, MD;  Location: MAIN OR [facility];  Service: Trauma    PR RIGHT HEART CATH O2 SATURATION & CARDIAC OUTPUT N/A 09/30/2020    Procedure: Right Heart Catheterization;  Surgeon: Neal Dy, MD;  Location: [facility] CATH;  Service: Cardiology    PR RIGHT HEART CATH O2 SATURATION & CARDIAC OUTPUT N/A 01/09/2021    Procedure: Right Heart Catheterization;  Surgeon: Lesle Reek, MD;  Location: [facility] CATH;  Service: Cardiology       Current Outpatient Medications   Medication Sig Dispense Refill    ACCU-CHEK AVIVA PLUS TEST STRP Strp UESE TO CHECK BLOOD SUGAR 3 TIMES A DAY BEFORE MEALS 100 each 11    acetaminophen (TYLENOL) 500 MG tablet Take 2 tablets (1,000 mg total) by mouth daily as needed for pain.      albuterol HFA 90 mcg/actuation inhaler Inhale 2 puffs every eight (8) hours as needed for wheezing.      amiodarone (PACERONE) 200 MG tablet Take 1 tablet (200 mg total) by mouth daily. 90 tablet 3    cranberry 500 mg cap Take 500 mg by mouth daily with evening meal.      estradioL (ESTRACE) 0.01 % (0.1 mg/gram) vaginal cream INSERT ONE GRAM VAGINALLY AT BEDTIME      fosfomycin (MONUROL) 3 gram Pack Take 3 g by mouth once a week. 36 g 3    gabapentin (NEURONTIN) 300 MG capsule Take 1 capsule (300 mg total) by mouth two (2) times a day. 180 capsule 3    insulin glargine (BASAGLAR, LANTUS) 100 unit/mL (3 mL) injection pen Inject 0.04  mL (4 Units total) under the skin nightly. 15 mL 3    lancets Misc 1 each by Miscellaneous route daily. Accu Check 100 each 6    montelukast (SINGULAIR) 10 mg tablet Take 1 tablet (10 mg total) by mouth in the morning. 90 tablet 2    NON FORMULARY APPLY FROM NECK DOWN TWICE A DAY      OXYCONTIN 10 mg TR12 12 hr crush resistant ER/CR tablet Take 1 tablet (10 mg total) by mouth every twelve (12) hours.      OXYGEN-AIR DELIVERY SYSTEMS MISC 5 L by Miscellaneous route. Currently using 3  L/min via [place]      pantoprazole (PROTONIX) 20 MG tablet Take 1 tablet (20 mg total) by mouth in the morning. 60 tablet 5    pen needle, diabetic (PEN NEEDLE) 31 gauge x 5/16 (8 mm) Ndle Injection Frequency is 1 time per day; Dx Code: Type 2 Diabetes uncontrolled (E11.65) 50 each 11    rosuvastatin (CRESTOR) 5 MG tablet Take 1 tablet (5 mg total) by mouth every other day. 15 tablet 11    spironolactone (ALDACTONE) 25 MG tablet Take 1 tablet (25 mg total) by mouth daily.      tafamidis (VYNDAMAX) 61 mg cap Take 1 capsule (61 mg) by mouth daily. 30 capsule 11    torsemide (DEMADEX) 20 MG tablet Take 2 tablets (40 mg total) by mouth daily. May also take 1 tablet (20 mg total) daily as needed (weight gain for 3lbs overnight or 5lbs in a week). 180 tablet 3    varicella-zoster gE-AS01B, PF, (SHINGRIX, PF,) 50 mcg/0.5 mL SusR injection Inject 0.5 mL into the muscle. 0.5 mL 1    ferrous sulfate 325 (65 FE) MG tablet Take 1 tablet (325 mg total) by mouth every other day. (Patient not taking: Reported on 06/11/2022) 45 tablet 3    metOLazone (ZAROXOLYN) 5 MG tablet Take 1 tablet (5 mg total) by mouth daily as needed (when instructed by cardiology clinic). 30 tablet 1    NARCAN 4 mg/actuation nasal spray 1 spray into alternating nostrils once as needed (opioid overdose). PRN - Emergency use. (Patient not taking: Reported on 06/11/2022)      sertraline (ZOLOFT) 25 MG tablet Take 1 tablet (25 mg total) by mouth daily. (Patient not taking: Reported on 06/05/2022) 90 tablet 3 tiotropium-olodateroL 2.5-2.5 mcg/actuation Mist Inhale 2 puffs daily. (Patient not taking: Reported on 06/11/2022) 4 g 11     No current facility-administered medications for this visit.     Allergies   Allergen Reactions    Nitrofurantoin Anaphylaxis and Other (See Comments)    Lipitor [Atorvastatin] Muscle Pain     SOB, Headache, fatigue, sick on my stomach       Objective:       Physical Exam  There were no vitals taken for this visit.   Wt Readings from Last 12 Encounters:   06/09/22 98.8 kg (217 lb 12.8 oz)   06/05/22 98.6 kg (217 lb 6.4 oz)   06/05/22 98 kg (216 lb)   05/22/22 97 kg (213 lb 12.8 oz)   05/21/22 97.5 kg (215 lb)   05/13/22 95.3 kg (210 lb)   05/13/22 96.2 kg (212 lb)   04/23/22 96.2 kg (212 lb)   03/13/22 95.3 kg (210 lb)   02/19/22 96.2 kg (212 lb)   02/13/22 95.7 kg (211 lb)   01/22/22 94.8 kg (209 lb)       General:  Patient is well-appearing in no  acute distress   Eyes:  Intact, sclerae anicteric.   Ears, nose, mouth: Benign   Respiratory:   Normal respiratory effort. Clear to auscultation bilaterally.  There are no wheezes. Nasal cannula.    Cardiovascular:   JVP about 1/2 up neck sitting upright. Rate and rhythm are regular.  There is no lifts or heaves.  Normal S1, S2. There is no murmur, gallops or rubs.  Radial and pedal pulses are 2+, bilaterally.   There is +1 bilateral to left ankle none on right.   Gastrointestinal:   Soft, non-tender, with active audible bowel sounds. Abdomen nondistended.  Liver is nonpalpable.   Musculoskeletal: No joint swelling   Skin: Warm, well perfused.   Neurologic: Appropriate mood and affect. Alert and oriented to person, place, and time. No gross motor or sensory deficits evident.     Recent Labs:  Office Visit on 06/09/2022   Component Date Value Ref Range Status    Sodium 06/09/2022 136  135 - 145 mmol/L Final    Potassium 06/09/2022 4.6  3.4 - 4.8 mmol/L Final    Chloride 06/09/2022 97 (L)  98 - 107 mmol/L Final    CO2 06/09/2022 33.0 (H)  20.0 - 31.0 mmol/L Final    Anion Gap 06/09/2022 6  5 - 14 mmol/L Final    BUN 06/09/2022 72 (H)  9 - 23 mg/dL Final    Creatinine 14/78/2956 3.60 (H)  0.60 - 0.80 mg/dL Final    BUN/Creatinine Ratio 06/09/2022 20   Final    eGFR [name]-EPI (2021) Female 06/09/2022 12 (L)  >=60 mL/min/1.44m2 Final    Glucose 06/09/2022 160  70 - 179 mg/dL Final    Calcium 21/30/8657 8.6 (L)  8.7 - 10.4 mg/dL Final    Albumin 84/69/6295 4.0  3.4 - 5.0 g/dL Final    Total Protein 06/09/2022 7.6  5.7 - 8.2 g/dL Final    Total Bilirubin 06/09/2022 0.5  0.3 - 1.2 mg/dL Final    AST 28/41/3244 15  <=34 U/L Final    ALT 06/09/2022 8 (L)  10 - 49 U/L Final    Alkaline Phosphatase 06/09/2022 93  46 - 116 U/L Final    BNP 06/09/2022 556.31 (H)  <=100 pg/mL Final    Magnesium 06/09/2022 4.2 (H)  1.6 - 2.6 mg/dL Final   Office Visit on 06/05/2022   Component Date Value Ref Range Status    Sodium 06/05/2022 137  135 - 145 mmol/L Final    Potassium 06/05/2022 4.7  3.4 - 4.8 mmol/L Final    Chloride 06/05/2022 101  98 - 107 mmol/L Final    CO2 06/05/2022 29.5  20.0 - 31.0 mmol/L Final    Anion Gap 06/05/2022 7  5 - 14 mmol/L Final    BUN 06/05/2022 58 (H)  9 - 23 mg/dL Final    Creatinine 12/30/7251 3.23 (H)  0.60 - 0.80 mg/dL Final    BUN/Creatinine Ratio 06/05/2022 18   Final    eGFR [name]-EPI (2021) Female 06/05/2022 14 (L)  >=60 mL/min/1.13m2 Final    Glucose 06/05/2022 155  70 - 179 mg/dL Final    Calcium 66/44/0347 8.7  8.7 - 10.4 mg/dL Final    BNP 42/59/5638 451.18 (H)  <=100 pg/mL Final    Magnesium 06/05/2022 3.9 (H)  1.6 - 2.6 mg/dL Final       Lab Results   Component Value Date    PRO-BNP 3,420.0 (H) 07/03/2021    PRO-BNP 905.0 (H) 03/21/2021  PRO-BNP 3,796.0 (H) 12/23/2020    PRO-BNP 1,970.0 (H) 05/30/2020    PRO-BNP 3,200.0 (H) 04/27/2020    Creatinine 3.60 (H) 06/09/2022    Creatinine 3.23 (H) 06/05/2022    Creatinine 2.84 (H) 05/21/2022    Creatinine 2.55 (H) 05/13/2022    Creatinine 2.33 (H) 03/13/2022    Creatinine 0.79 01/06/2011    BUN 72 (H) 06/09/2022    BUN 58 (H) 06/05/2022    BUN 52 (H) 05/21/2022    BUN 66 (H) 05/13/2022    BUN 59 (H) 03/13/2022    BUN 20 01/06/2011    Magnesium 4.2 (H) 06/09/2022    Magnesium 3.9 (H) 06/05/2022    Magnesium 3.3 (H) 05/13/2022    Magnesium 3.0 (H) 03/13/2022    Magnesium 3.7 (H) 02/13/2022    Magnesium 2.0 01/06/2011       Metric Tracker:  Did today's visit result in [name]? No  Did today's visit result in hospital admission? No  Did today's visit result in referral to cardiology? n/a  Today's visit was a referral from Internal Medicine referral from Internal Medicine

## 2022-06-11 NOTE — Unmapped (Signed)
[name] Clinic Discharge Instructions:    MEDICATIONS:  We are changing your medications today.  - Keep on torsemide 40mg  twice a day until you are around 211lbs, then reduce to 40mg  in the morning, 20mg  in the afternoon. If you keep losing weight despite this, might need to cut back to just 40mg  once daily  Call if you have questions about your medications.    IMPORTANT INSTRUCTIONS:  - Weigh yourself daily in the morning; write your weight down and bring it with you next time   - Limit your fluid intake to 2 Liters (half-gallon) per day    - Limit your salt intake to 2-3 grams (2000-3000 mg) per day  - If you have a morning diuresis appointment, don't take your morning diuretic  - If you have an afternoon diuresis appointment, take your morning diuretic    LABS:  Your labs are improving    NEXT APPOINTMENT:  Return to clinic in 10 days with me      My office number (c/o De Burrs RN) is [phone] if you need further assistance, or need to schedule with our [name] clinic in the future.    If you need to reschedule future appointments, please call [phone].   After office hours, if you have urgent questions/problems, contact the on-call cardiologist through the hospital operator: [phone].    Please do not send a MyChart message for potentially life-threatening symptoms or time-sensitive issues. MyChart messages are not monitored after normal business hours or on weekends. Please call 911 for a true medical emergency.

## 2022-06-16 DIAGNOSIS — N1832 Type 2 diabetes mellitus with stage 3b chronic kidney disease, without long-term current use of insulin (CMS-HCC): Principal | ICD-10-CM

## 2022-06-16 DIAGNOSIS — E1122 Type 2 diabetes mellitus with diabetic chronic kidney disease: Principal | ICD-10-CM

## 2022-06-16 DIAGNOSIS — I5032 Chronic diastolic (congestive) heart failure: Principal | ICD-10-CM

## 2022-06-16 NOTE — Unmapped (Signed)
[facility] Internal Medicine   CHRONIC CARE MANAGEMENT OUTREACH ENCOUNTER           Date of Service:  06/16/2022      Service:  Care Coordination - phone  Is there someone else in the room? No.   MyChart use by patient is active: no    Post-outreach Action Items:  Provider: No/none.  CM: No/none.  Patient: No/none.    Follow-up Next Call: N/A    Chronic Care Management (CCM) Outreach  Purpose of outreach:  Diabetes and Heart Failure   Care Manager (CM) completed the following:  Reviewed chart  Identified following:   Patient follows with Cardiology and Pulmonology   06/11/22 urgent visit at [facility] [name]    Call placed to Barbara Huber re: items above and discussed the following:       Updates  New concerns or symptoms: No/ none.  Updates on ongoing concerns/symptoms:  CHF:  Patient shared that she is feeling pretty good now, and especially feels better following diuresis visit on 06/11/22.   CCM Program:  Discussed option to unenroll from CCM program. Patient decided she would like to stay enrolled at this time.   New barriers: No/ none.  Recent hospitalization / ED / Urgent Care or providers seen outside the [facility] system: Yes: [name] on 05/21/22 for CHF Exacerbation.  Refills needed: No/ none.  Medication response/ notes: No/none.    Wrap-up  Reviewed upcoming clinic appointment(s): Yes.  Plans to keep appointment(s): Yes.  Transportation assistance needed: No.  Patient was encouraged to reach out to their provider with any questions or concerns  Future Appointments   Date Time Provider Department Center   06/22/2022  1:00 PM DIURESIS HF NP [place] [facility]   07/22/2022  3:20 PM Artelia Laroche, [place] [facility]   08/19/2022  1:30 PM [name] Karmen Stabs, AGNP [facility]   11/13/2022 11:30 AM Carin Hock, MD Janeann Forehand [facility]   12/04/2022 12:00 PM Zannie Cove, [place] [facility]       A copy of this Patient Outreach/CCM Encounter was sent to patient's Primary Care Provider     CCM Documentation  Time spent in direct care with patient and/or health care proxy via non-in-person encounter(s): 10 min  Time spent in indirect patient care and coordination: 15 min

## 2022-06-17 NOTE — Unmapped (Signed)
Faxed over signed CMN to [employer] at [phone]. Fax confirmation received.

## 2022-06-19 NOTE — Unmapped (Signed)
I was the supervising physician in the delivery of the service. Sophiagrace Benbrook R Ishaan Villamar, MD

## 2022-06-22 ENCOUNTER — Ambulatory Visit: Admit: 2022-06-22 | Discharge: 2022-06-23 | Payer: MEDICARE | Attending: Adult Health | Primary: Adult Health

## 2022-06-22 DIAGNOSIS — R944 Abnormal results of kidney function studies: Principal | ICD-10-CM

## 2022-06-22 DIAGNOSIS — I5032 Chronic diastolic (congestive) heart failure: Principal | ICD-10-CM

## 2022-06-22 LAB — BASIC METABOLIC PANEL
ANION GAP: 7 mmol/L (ref 5–14)
BLOOD UREA NITROGEN: 83 mg/dL — ABNORMAL HIGH (ref 9–23)
BUN / CREAT RATIO: 29
CALCIUM: 9.5 mg/dL (ref 8.7–10.4)
CHLORIDE: 103 mmol/L (ref 98–107)
CO2: 29.6 mmol/L (ref 20.0–31.0)
CREATININE: 2.83 mg/dL — ABNORMAL HIGH
EGFR CKD-EPI (2021) FEMALE: 16 mL/min/{1.73_m2} — ABNORMAL LOW (ref >=60)
GLUCOSE RANDOM: 169 mg/dL (ref 70–179)
POTASSIUM: 4.6 mmol/L (ref 3.4–4.8)
SODIUM: 140 mmol/L (ref 135–145)

## 2022-06-22 LAB — B-TYPE NATRIURETIC PEPTIDE: B-TYPE NATRIURETIC PEPTIDE: 206.59 pg/mL — ABNORMAL HIGH (ref <=100)

## 2022-06-22 LAB — MAGNESIUM: MAGNESIUM: 2.6 mg/dL (ref 1.6–2.6)

## 2022-06-22 MED ORDER — SPIRONOLACTONE 25 MG TABLET
ORAL_TABLET | Freq: Every day | ORAL | 3 refills | 90 days | Status: CP
Start: 2022-06-22 — End: 2023-06-22

## 2022-06-22 NOTE — Unmapped (Signed)
[name] Note    Primary Cardiologist/Cardiology Provider(s):  Dr Wonda Cheng  PCP:  Jacquiline Doe, MD    Last Cardiology Clinic Visit:  06/20/2021  Last [name] Visit: 06/11/22    Reason for Visit:  Barbara Huber is a 81 y.o. female with a history of diastolic heart failure, who is being seen today in the [name] for an urgent visit for volume status optimization including [name].    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  Baseline weight of 212-213lbs. Currently 217lbs. 217.8 --> 216.7 --> ***   - She has mild LE edema L>R, elevated JVP  - IV Lasix 120 was administered during clinic with good response  - Labs were obtained: Cr 3.2, K 4.6. BNP 292  - Continue torsemide 40mg  BID until weight is ~211lbs, then reduce to 40mg  qAM, 20mg  qPM with extra 20mg  PRN  - Hold metolazone for now  - Continue spironolactone 25mg  daily   - Suspect trigger for this exacerbation was IV iron infusions - had very similar presentation w/ hospital admission in 11/2021 after IV iron in the fall.     2.  Hypermagnesemia  - likely in setting of renal dysfunction, Phillip's tablets use (magnesium 500mg ). Stop Phillip's tablets  - Improving (4.2 --> 3.6)    3. AKI  - Cr increased in setting of volume retention  - hold gabapentin     Response to [name]:         Medications Heart rate Blood pressure Weight (lbs)   Pre  60 125/59 216.7   Hour 0 IV Lasix 120 mg      Hour 2 IV Lasix 0 mg 57 120/58    Post    215.5     Output:  I/O       None            Return to clinic:    No follow-ups on file.    Future Appointments   Date Time Provider Department Center   06/22/2022  1:00 PM DIURESIS HF NP [place] [facility]   07/22/2022  3:20 PM Artelia Laroche, MD [facility]   08/19/2022  1:30 PM [name] Karmen Stabs, AGNP [facility]   11/13/2022 11:30 AM Carin Hock, MD [facility]   12/04/2022 12:00 PM Zannie Cove, MD [facility]       History of Present Illness:  Barbara Huber is a 81 y.o. female with a history of diastolic heart failure who presents today for [name].     6/13  She has not been feeling well. Weight at home has been 217lbs since Sunday. She last took metolazone on Sunday but it didn't seem to do anything. Yesterday, she took an extra 20mg  torsemide on top of her usual 40mg . She didn't take anything this morning because she wasn't sure what was going on. She has been feel weak, sleepy, and having jerking again of his arms. They cut back her gabapentin from 300mg  BID to just 300mg  daily. Her appetite is poor. She doesn't feel SOB. She has a little ankle swelling on the left. She felt pretty good around 213lbs. She was given IV Lasix 120mg  x1.    Today she presents for follow up. She is feeling a lot better, less drowsy and weigh decreased to 213lbs. She  was in the kitchen cooking yesterday, felt well enough to do that. She does think 211lbs is even better, so aiming for that.          Past Medical History:   Diagnosis Date    Acute kidney injury superimposed on chronic kidney disease (CMS-HCC) 10/11/2020    Acute on chronic diastolic (congestive) heart failure (CMS-HCC) 08/23/2020    AKI (acute kidney injury) (CMS-HCC) 04/14/2015    Lab Results  Component  Value  Date     CREATININE  1.90 (H)  06/12/2021     Had a bump in her creatinine when she was taking her diuretics every day.  She is currently taking 40 mg daily of torsemide and 50 mg of spironolactone.  Her volume status is fragile.  Previously when she stopped her diuretic she becomes short of breath.  Plan: We will check her BMP today.  We will likely have to go to 40    Arthritis     Calculus of kidney     Calculus of ureter     CHF (congestive heart failure) (CMS-HCC)     Chronic atrial fibrillation (CMS-HCC) 07/20/2019    COPD (chronic obstructive pulmonary disease) (CMS-HCC)     Diabetes (CMS-HCC)     Gangrenous cholecystitis 10/11/2020    GERD (gastroesophageal reflux disease)     Hydronephrosis     Hypertension     Hyponatremia 10/11/2020    Intermediate coronary syndrome (CMS-HCC) 03/13/2014    Lower extremity edema 09/28/2020    Lumbar stenosis     Microscopic hematuria     Nausea alone     Nephrolithiasis 04/17/2016    Neuropathy     Nocturia     Other chronic cystitis     Pulmonary hypertension (CMS-HCC)     Renal colic     Sleep apnea     Unstable angina pectoris (CMS-HCC) 03/13/2014     Past Surgical History:   Procedure Laterality Date    BACK SURGERY  1995    CARPAL TUNNEL RELEASE Left 2014    HYSTERECTOMY  1971    IR INSERT CHOLECYSTOSMY TUBE PERCUTANEOUS  10/02/2020    IR INSERT CHOLECYSTOSMY TUBE PERCUTANEOUS 10/02/2020 Braulio Conte, MD IMG [facility]    LUMBAR DISC SURGERY      PR REMOVAL GALLBLADDER N/A 10/06/2020    Procedure: CHOLECYSTECTOMY;  Surgeon: Katherina Mires, MD;  Location: MAIN [facility];  Service: Trauma    PR RIGHT HEART CATH O2 SATURATION & CARDIAC OUTPUT N/A 09/30/2020    Procedure: Right Heart Catheterization;  Surgeon: Neal Dy, MD;  Location: [facility] CATH;  Service: Cardiology    PR RIGHT HEART CATH O2 SATURATION & CARDIAC OUTPUT N/A 01/09/2021    Procedure: Right Heart Catheterization;  Surgeon: Lesle Reek, MD;  Location: [facility] CATH;  Service: Cardiology       Current Outpatient Medications   Medication Sig Dispense Refill    ACCU-CHEK AVIVA PLUS TEST STRP Strp UESE TO CHECK BLOOD SUGAR 3 TIMES A DAY BEFORE MEALS 100 each 11    acetaminophen (TYLENOL) 500 MG tablet Take 2 tablets (1,000 mg total) by mouth daily as needed for pain.      albuterol HFA 90 mcg/actuation inhaler Inhale 2 puffs every eight (8) hours as needed for wheezing.      amiodarone (PACERONE) 200 MG tablet Take 1 tablet (200 mg total) by mouth daily. 90 tablet 3    cranberry 500 mg cap Take 500 mg  by mouth daily with evening meal. estradioL (ESTRACE) 0.01 % (0.1 mg/gram) vaginal cream INSERT ONE GRAM VAGINALLY AT BEDTIME      ferrous sulfate 325 (65 FE) MG tablet Take 1 tablet (325 mg total) by mouth every other day. (Patient not taking: Reported on 06/11/2022) 45 tablet 3    fosfomycin (MONUROL) 3 gram Pack Take 3 g by mouth once a week. 36 g 3    gabapentin (NEURONTIN) 300 MG capsule Take 1 capsule (300 mg total) by mouth two (2) times a day. 180 capsule 3    insulin glargine (BASAGLAR, LANTUS) 100 unit/mL (3 mL) injection pen Inject 0.04 mL (4 Units total) under the skin nightly. 15 mL 3    lancets Misc 1 each by Miscellaneous route daily. Accu Check 100 each 6    metOLazone (ZAROXOLYN) 5 MG tablet Take 1 tablet (5 mg total) by mouth daily as needed (when instructed by cardiology clinic). 30 tablet 1    montelukast (SINGULAIR) 10 mg tablet Take 1 tablet (10 mg total) by mouth in the morning. 90 tablet 2    NARCAN 4 mg/actuation nasal spray 1 spray into alternating nostrils once as needed (opioid overdose). PRN - Emergency use. (Patient not taking: Reported on 06/11/2022)      NON FORMULARY APPLY FROM NECK DOWN TWICE A DAY      OXYCONTIN 10 mg TR12 12 hr crush resistant ER/CR tablet Take 1 tablet (10 mg total) by mouth every twelve (12) hours.      OXYGEN-AIR DELIVERY SYSTEMS MISC 5 L by Miscellaneous route. Currently using 3  L/min via [place]      pen needle, diabetic (PEN NEEDLE) 31 gauge x 5/16 (8 mm) Ndle Injection Frequency is 1 time per day; Dx Code: Type 2 Diabetes uncontrolled (E11.65) 50 each 11    rosuvastatin (CRESTOR) 5 MG tablet Take 1 tablet (5 mg total) by mouth every other day. 15 tablet 11    sertraline (ZOLOFT) 25 MG tablet Take 1 tablet (25 mg total) by mouth daily. (Patient not taking: Reported on 06/05/2022) 90 tablet 3    spironolactone (ALDACTONE) 25 MG tablet Take 1 tablet (25 mg total) by mouth daily.      tafamidis (VYNDAMAX) 61 mg cap Take 1 capsule (61 mg) by mouth daily. 30 capsule 11    tiotropium-olodateroL 2.5-2.5 mcg/actuation Mist Inhale 2 puffs daily. (Patient not taking: Reported on 06/11/2022) 4 g 11    torsemide (DEMADEX) 20 MG tablet Take 2 tablets (40 mg total) by mouth daily. May also take 1 tablet (20 mg total) daily as needed (weight gain for 3lbs overnight or 5lbs in a week). 180 tablet 3    varicella-zoster [id number], PF, (SHINGRIX, PF,) 50 mcg/0.5 mL SusR injection Inject 0.5 mL into the muscle. 0.5 mL 1     No current facility-administered medications for this visit.     Allergies   Allergen Reactions    Nitrofurantoin Anaphylaxis and Other (See Comments)    Lipitor [Atorvastatin] Muscle Pain     SOB, Headache, fatigue, sick on my stomach       Objective:       Physical Exam  There were no vitals taken for this visit.   Wt Readings from Last 12 Encounters:   06/11/22 97.8 kg (215 lb 8 oz)   06/09/22 98.8 kg (217 lb 12.8 oz)   06/05/22 98.6 kg (217 lb 6.4 oz)   06/05/22 98 kg (216 lb)   05/22/22 97 kg (213 lb 12.8  oz)   05/21/22 97.5 kg (215 lb)   05/13/22 95.3 kg (210 lb)   05/13/22 96.2 kg (212 lb)   04/23/22 96.2 kg (212 lb)   03/13/22 95.3 kg (210 lb)   02/19/22 96.2 kg (212 lb)   02/13/22 95.7 kg (211 lb)       General:  Patient is well-appearing in no acute distress   Eyes:  Intact, sclerae anicteric.   Ears, nose, mouth: Benign   Respiratory:   Normal respiratory effort. Clear to auscultation bilaterally.  There are no wheezes. Nasal cannula.    Cardiovascular:   JVP about 1/4 up neck sitting upright. Rate and rhythm are regular.  There is no lifts or heaves.  Normal S1, S2. There is no murmur, gallops or rubs.  Radial and pedal pulses are 2+, bilaterally.   There is trace  to left ankle none on right.   Gastrointestinal:   Soft, non-tender, with active audible bowel sounds. Abdomen nondistended.  Liver is nonpalpable.   Musculoskeletal: No joint swelling   Skin: Warm, well perfused.   Neurologic: Appropriate mood and affect. Alert and oriented to person, place, and time. No gross motor or sensory deficits evident.     Recent Labs:  No visits with results within 1 Week(s) from this visit.   Latest known visit with results is:   Office Visit on 06/11/2022   Component Date Value Ref Range Status    Sodium 06/11/2022 135  135 - 145 mmol/L Final    Potassium 06/11/2022 4.6  3.4 - 4.8 mmol/L Final    Chloride 06/11/2022 98  98 - 107 mmol/L Final    CO2 06/11/2022 30.4  20.0 - 31.0 mmol/L Final    Anion Gap 06/11/2022 7  5 - 14 mmol/L Final    BUN 06/11/2022 66 (H)  9 - 23 mg/dL Final    Creatinine 16/09/9603 3.20 (H)  0.60 - 0.80 mg/dL Final    BUN/Creatinine Ratio 06/11/2022 21   Final    eGFR [name]-EPI (2021) Female 06/11/2022 14 (L)  >=60 mL/min/1.45m2 Final    Glucose 06/11/2022 151  70 - 179 mg/dL Final    Calcium 54/08/8118 8.7  8.7 - 10.4 mg/dL Final    BNP 14/78/2956 292.58 (H)  <=100 pg/mL Final    Magnesium 06/11/2022 3.6 (H)  1.6 - 2.6 mg/dL Final       Lab Results   Component Value Date    PRO-BNP 3,420.0 (H) 07/03/2021    PRO-BNP 905.0 (H) 03/21/2021    PRO-BNP 3,796.0 (H) 12/23/2020    PRO-BNP 1,970.0 (H) 05/30/2020    PRO-BNP 3,200.0 (H) 04/27/2020    Creatinine 3.20 (H) 06/11/2022    Creatinine 3.60 (H) 06/09/2022    Creatinine 3.23 (H) 06/05/2022    Creatinine 2.84 (H) 05/21/2022    Creatinine 2.55 (H) 05/13/2022    Creatinine 0.79 01/06/2011    BUN 66 (H) 06/11/2022    BUN 72 (H) 06/09/2022    BUN 58 (H) 06/05/2022    BUN 52 (H) 05/21/2022    BUN 66 (H) 05/13/2022    BUN 20 01/06/2011    Magnesium 3.6 (H) 06/11/2022    Magnesium 4.2 (H) 06/09/2022    Magnesium 3.9 (H) 06/05/2022    Magnesium 3.3 (H) 05/13/2022    Magnesium 3.0 (H) 03/13/2022    Magnesium 2.0 01/06/2011       Metric Tracker:  Did today's visit result in [name]? No  Did today's visit result in hospital admission? No  Did today's visit result in referral to cardiology? n/a  Today's visit was a referral from Internal Medicine 06/09/2022    Magnesium 3.9 (H) 06/05/2022    Magnesium 3.3 (H) 05/13/2022    Magnesium 2.0 01/06/2011       Metric Tracker:  Did today's visit result in [name]? No  Did today's visit result in hospital admission? No  Did today's visit result in referral to cardiology? n/a  Today's visit was a referral from Internal Medicine

## 2022-06-22 NOTE — Unmapped (Signed)
[name] Clinic Discharge Instructions:    MEDICATIONS:  We are changing your medications today.  - Take torsemide to 40mg  in the morning and 20mg  in the afternoon. If you gain 3lbs in 24hrs or 5lbs in a week, increase to 40mg  twice a day until your weight is back down. If you LOSE more weight (below 210lbs) then reduce to just 40mg  daily.   - NO MORE phillips tablets. OK to use miralax, senna, colace. Avoid products with magnesium.  Call if you have questions about your medications.    IMPORTANT INSTRUCTIONS:  - Weigh yourself daily in the morning; write your weight down and bring it with you next time   - Limit your fluid intake to 2 Liters (half-gallon) per day    - Limit your salt intake to 2-3 grams (2000-3000 mg) per day  - If you have a morning diuresis appointment, don't take your morning diuretic  - If you have an afternoon diuresis appointment, take your morning diuretic    LABS:  We will call you if your labs need attention    NEXT APPOINTMENT:  Return to clinic in 3 weeks with Derwood Kaplan, AGNP      My office number (c/o De Burrs RN) is [phone] if you need further assistance, or need to schedule with our [name] clinic in the future.    If you need to reschedule future appointments, please call [phone].   After office hours, if you have urgent questions/problems, contact the on-call cardiologist through the hospital operator: [phone].    Please do not send a MyChart message for potentially life-threatening symptoms or time-sensitive issues. MyChart messages are not monitored after normal business hours or on weekends. Please call 911 for a true medical emergency.

## 2022-06-29 DIAGNOSIS — E1122 Type 2 diabetes mellitus with diabetic chronic kidney disease: Principal | ICD-10-CM

## 2022-06-29 DIAGNOSIS — N1832 Type 2 diabetes mellitus with stage 3b chronic kidney disease, without long-term current use of insulin (CMS-HCC): Principal | ICD-10-CM

## 2022-06-29 MED ORDER — BD ULTRA-FINE SHORT PEN NEEDLE 31 GAUGE X 5/16" (8 MM)
0 refills | 0 days
Start: 2022-06-29 — End: ?

## 2022-07-01 DIAGNOSIS — R1013 Epigastric pain: Principal | ICD-10-CM

## 2022-07-01 DIAGNOSIS — E1122 Type 2 diabetes mellitus with diabetic chronic kidney disease: Principal | ICD-10-CM

## 2022-07-01 DIAGNOSIS — N1832 Type 2 diabetes mellitus with stage 3b chronic kidney disease, without long-term current use of insulin (CMS-HCC): Principal | ICD-10-CM

## 2022-07-01 MED ORDER — PANTOPRAZOLE 20 MG TABLET,DELAYED RELEASE
ORAL_TABLET | Freq: Every day | ORAL | 5 refills | 60 days | Status: CP
Start: 2022-07-01 — End: 2023-06-26

## 2022-07-01 MED ORDER — PEN NEEDLE, DIABETIC 31 GAUGE X 5/16" (8 MM)
ORAL | 1 refills | 0.00000 days | Status: CP
Start: 2022-07-01 — End: 2022-07-01

## 2022-07-01 NOTE — Unmapped (Signed)
Medication Question/ Issue from Patient    Name of medication: pen needle, diabetic (BD ULTRA-FINE SHORT PEN NEEDLE) 31 gauge x 5/16 (8 mm) Ndle [[id number]]   Caller described issue:     Patient daughter states she is at the pharmacy right now and states that they will not fill the Rx because the directions are put in incorrectly.     Caller desired outcome (eg, re-send to pharmacy): Patient daughter would like this take care of as soon as possible and to please give them or the pharmacy a call back to update.     Best callback number if any questions (defaults to patient's preferred phone - confirm or change): [phone]  PCP: Jacquiline Doe, MD  Last encounter in department: 02/19/2022  (If more than a year, offer an appointment.)

## 2022-07-01 NOTE — Unmapped (Signed)
Med refill

## 2022-07-02 NOTE — Unmapped (Signed)
[facility] Refill Coordination Note    Specialty Medication(s) to be Shipped:   General Specialty: Vyndamax    Other medication(s) to be shipped: No additional medications requested for fill at this time     Barbara Huber, DOB: 03-26-1941  Phone: [phone] (home)       All above HIPAA information was verified with patient.     Was a Nurse, learning disability used for this call? No    Completed refill call assessment today to schedule patient's medication shipment from the [facility] [phone]).  All relevant notes have been reviewed.     Specialty medication(s) and dose(s) confirmed: Regimen is correct and unchanged.   Changes to medications: Machaela reports no changes at this time.  Changes to insurance: No  New side effects reported not previously addressed with a pharmacist or physician: None reported  Questions for the pharmacist: No    Confirmed patient received a Conservation officer, historic buildings and a Surveyor, mining with first shipment. The patient will receive a drug information handout for each medication shipped and additional FDA Medication Guides as required.       DISEASE/MEDICATION-SPECIFIC INFORMATION        N/A    SPECIALTY MEDICATION ADHERENCE     Medication Adherence    Patient reported X missed doses in the last month: 0  Specialty Medication: Vyndamax 61mg   Patient is on additional specialty medications: No  Patient is on more than two specialty medications: No              Were doses missed due to medication being on hold? No    Vyndamax 61 mg: 8-12 days of medicine on hand       REFERRAL TO PHARMACIST     Referral to the pharmacist: Not needed      [facility]     Shipping address confirmed in Epic.     Delivery Scheduled: Yes, Expected medication delivery date: 07/07/22.     Medication will be delivered via Next Day Courier to the prescription address in Epic WAM.    [facility] Specialty Technician

## 2022-07-06 MED FILL — VYNDAMAX 61 MG CAPSULE: ORAL | 30 days supply | Qty: 30 | Fill #6

## 2022-07-15 ENCOUNTER — Ambulatory Visit: Admit: 2022-07-15 | Discharge: 2022-07-16 | Payer: MEDICARE | Attending: Adult Health | Primary: Adult Health

## 2022-07-15 DIAGNOSIS — R944 Abnormal results of kidney function studies: Principal | ICD-10-CM

## 2022-07-15 DIAGNOSIS — I5032 Chronic diastolic (congestive) heart failure: Principal | ICD-10-CM

## 2022-07-15 LAB — BASIC METABOLIC PANEL
ANION GAP: 9 mmol/L (ref 5–14)
BLOOD UREA NITROGEN: 71 mg/dL — ABNORMAL HIGH (ref 9–23)
BUN / CREAT RATIO: 28
CALCIUM: 9.7 mg/dL (ref 8.7–10.4)
CHLORIDE: 103 mmol/L (ref 98–107)
CO2: 26.4 mmol/L (ref 20.0–31.0)
CREATININE: 2.57 mg/dL — ABNORMAL HIGH
EGFR CKD-EPI (2021) FEMALE: 18 mL/min/{1.73_m2} — ABNORMAL LOW (ref >=60)
GLUCOSE RANDOM: 151 mg/dL (ref 70–179)
POTASSIUM: 4.4 mmol/L (ref 3.4–4.8)
SODIUM: 138 mmol/L (ref 135–145)

## 2022-07-15 LAB — B-TYPE NATRIURETIC PEPTIDE: B-TYPE NATRIURETIC PEPTIDE: 185.22 pg/mL — ABNORMAL HIGH (ref <=100)

## 2022-07-15 LAB — MAGNESIUM: MAGNESIUM: 2.5 mg/dL (ref 1.6–2.6)

## 2022-07-15 NOTE — Unmapped (Signed)
[name] Clinic Discharge Instructions:    MEDICATIONS:  NO medication changes today.    Call if you have questions about your medications.    IMPORTANT INSTRUCTIONS:  - Weigh yourself daily in the morning; write your weight down and bring it with you next time   - Limit your fluid intake to 2 Liters (half-gallon) per day    - Limit your salt intake to 2-3 grams (2000-3000 mg) per day  - If you have a morning diuresis appointment, don't take your morning diuretic  - If you have an afternoon diuresis appointment, take your morning diuretic    LABS:  We will call you if your labs need attention    NEXT APPOINTMENT:  Return to clinic in 1  month  with Derwood Kaplan, AGNP      My office number (c/o De Burrs RN) is [phone] if you need further assistance, or need to schedule with our [name] clinic in the future.    If you need to reschedule future appointments, please call [phone].   After office hours, if you have urgent questions/problems, contact the on-call cardiologist through the hospital operator: [phone].    Please do not send a MyChart message for potentially life-threatening symptoms or time-sensitive issues. MyChart messages are not monitored after normal business hours or on weekends. Please call 911 for a true medical emergency.

## 2022-07-15 NOTE — Unmapped (Signed)
[facility]yloid [facility] Note    Referring Provider: Montez Hageman, AGNP  [address]  [address]  [place],  [place] [address]   Primary Provider: Jacquiline Doe, MD  [address]  [place] [address]   Other Providers:  Dr Luretha Murphy    Reason for Visit:  Barbara Huber is a 81 y.o. female being seen for routine visit and continued care of cardiac amyloidosis .    Assessment & Plan:  1. 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   - Moderately increased wall thickness with low voltage ECG - she's had issues with hypotension on ARB and BB, as well as atrial arrhythmias. SPEP/UPEP/free light chains without concern. 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 She has one daughter who accompanies her and is informed.   - DC'd Jardiance 10mg  daily due to recurrent UTIs  - Weight is at baseline (~210-212lbs), even a bit lower with stable renal function. Continue torsemide 40mg  daily with extra 20mg  PRN.   - Continue Tafamidis 61 mg daily   - Continue spironolactone 25 mg daily   - She is not interested in CardioMEMS at this time    2. Persistent atrial fibrillation  - CHA2DS2-VASc score = 5 (1-gender, 2-Age, 1-DM, 1-HTN)  - We stopped Eliquis given recurrent anemia, shared decision making w/ pt and daughter. She is not interested in [place] (or any procedures) at this time  - She had been in afib since at least 07/2020; now s/p cardioversion 01/10/21 and continues to be in NSR since  - On amiodarone 200mg  daily - increased it back herself after seeing no improvement in tremors/jerking with decreased dose  - amiodarone monitoring - last PFTs in 04/2021. Last TSH 04/2022, LFTs 05/2022    3. [name]  - Cr staying around 2.3-2.5; 2.57 today    4. COPD  - home O2 3L [place] - followed by pulm  - on Advair and Spiriva    5. Iron deficiency anemia  - Finsished IV Iron - can recheck iron panel at follow up    6. Anxiety  - Tolerating Zoloft 25mg  with improved moved/anxiety    Follow-up:  Return in about 1 month (around 08/15/2022) for already scheduled appointment.     History of Present Illness:  Barbara Huber is a 81 y.o. female with PMHx of COPD on 3L home , CHF, [name], [name], [name] She was initially admitted with decompensated [name] in 2021 in setting of  Septic Shock d/t Gangrenous Cholecystitis s/p cholecystectomy. She underwent further evaluation with PYP scan which showed TTR amyloid. She has since been followed in clinic for management of her [name]. In 2022, had numerous admissions for weakness/urosepsis, shortness of breath/orthopnea/weight gain, lightheadedness/SOB/Blurry vision found to be anemic.    Summary of most recent visits:  - 01/16/22 [facility]): Feeling well from a cardiac standpoint. Started Zoloft for anxiety  - 02/13/22 [facility]): Weight 210-211. Feeling well. Taking extra torsemide 20mg  about every other day, which was stopped for elevated Cr.   - 03/13/22 ([name]): Weight 201-216, took metolazone PRN, weight stabilized at 210lbs. Considering cardioMEMS.   - 05/13/22 ([name]): Had 3 iron infusions. Doing well w/ volume, weight 211-212lbs. Cooking, staying active around house.   - 05/21/22 ([name]): CP/SOB. Weight 217 from  213. She took metolazone x1 dose.   - 05/22/22 ([name]): Ed follow up. Back down to 213lbs. Still having CP w/ certain movement.   - 06/05/22 ([name]): Weight 214-216lb, should be 212lb.   - 06/09/22, 06/11/22, 06/22/22 ([name]): Weight down from 217 to 213lb, 211lb at home. Feeling well by 6/26.     Interval history  Today she presents for follow up. She had been feeling better after her last visit, but then starting feeling groggy and weak again. They had to increase her O2 from 3L to 5L because her O2 sat was running in the 80s. Her daughter got to thinking about recent med changes and decided to stop her new inhaler (Stiolto) and go back to Advair/Spiriva. That seems to be working much better. Her O2 sat is normal and they were able to turn her O2 back down to baseline. She is more alert and not groggy anymore. Weight is down to 207lbs at home, taking torsemide 40mg  daily and none in the afternoon. She is starting to feel more like herself.     Cardiovascular History & Procedures:    Cath / PCI:  none    CV Surgery:   none    EP Procedures and Devices:  none    Non-Invasive Evaluation(s):    Echo:  12/09/21  Summary    1. The left ventricle is normal in size with severely increased wall  thickness.    2. The left ventricular systolic function is hyperdynamic, LVEF is visually  estimated at >70%.    3. There is grade III diastolic dysfunction (severely elevated filling  pressure).    4. The left atrium is moderately to severely dilated in size.    5. The right ventricle is mildly dilated in size, with normal systolic  function.    6. The right atrium is mildly dilated  in size.    7. IVC size and inspiratory change suggest elevated right atrial pressure.  (10-20 mmHg).    04/24/21   1. The left ventricle is normal in size with mildly increased wall  thickness.    2. The left ventricular systolic function is normal, LVEF is visually  estimated at > 55%.    3. There is grade II diastolic dysfunction (elevated filling pressure).    4. Mitral annular calcification is present (mild).    5. The left atrium is mildly dilated in size.    6. The right ventricle is mildly dilated in size, with low normal systolic  function.    7. IVC size and inspiratory change suggest elevated right atrial pressure.  (10-20 mmHg).    09/30/20  Summary    1. Limited study to assess systolic function.    2. IVC size and inspiratory change suggest normal right atrial pressure.  (0-5 mmHg).    3. The left ventricle is normal in size with mildly to moderately increased  wall thickness.    4. The left ventricular systolic function is normal with no obvious wall  motion abnormalities, LVEF is visually estimated at > 55%.    5. Mitral annular calcification is present.    6. The left atrium is mildly dilated in size.    7. The right ventricle is upper normal in size, with reduced systolic  Function.    Cardiac CT/MRI/Nuclear Tests:   None    6 Minute Walk:  None    Cardiopulmonary Stress Tests:   None  Other Past Medical History:  See below for the complete EPIC list of past medical and surgical history.      Allergies:  Nitrofurantoin and Lipitor [atorvastatin]    Current Medications:  Current Outpatient Medications   Medication Sig Dispense Refill    ACCU-CHEK AVIVA PLUS TEST STRP Strp UESE TO CHECK BLOOD SUGAR 3 TIMES A DAY BEFORE MEALS 100 each 11    acetaminophen (TYLENOL) 500 MG tablet Take 2 tablets (1,000 mg total) by mouth daily as needed for pain.      albuterol HFA 90 mcg/actuation inhaler Inhale 2 puffs every eight (8) hours as needed for wheezing.      amiodarone (PACERONE) 200 MG tablet Take 1 tablet (200 mg total) by mouth daily. 90 tablet 3    cranberry 500 mg cap Take 500 mg by mouth daily with evening meal.      estradioL (ESTRACE) 0.01 % (0.1 mg/gram) vaginal cream INSERT ONE GRAM VAGINALLY AT BEDTIME      fosfomycin (MONUROL) 3 gram Pack Take 3 g by mouth once a week. 36 g 3    gabapentin (NEURONTIN) 300 MG capsule Take 1 capsule (300 mg total) by mouth two (2) times a day. (Patient taking differently: Take 1 capsule (300 mg total) by mouth in the morning.) 180 capsule 3    insulin glargine (BASAGLAR, LANTUS) 100 unit/mL (3 mL) injection pen Inject 0.04 mL (4 Units total) under the skin nightly. 15 mL 3    lancets Misc 1 each by Miscellaneous route daily. Accu Check 100 each 6    montelukast (SINGULAIR) 10 mg tablet Take 1 tablet (10 mg total) by mouth in the morning. 90 tablet 2    NARCAN 4 mg/actuation nasal spray 1 spray into alternating nostrils once as needed (opioid overdose). PRN - Emergency use.      NON FORMULARY APPLY FROM NECK DOWN TWICE A DAY      OXYCONTIN 10 mg TR12 12 hr crush resistant ER/CR tablet Take 1 tablet (10 mg total) by mouth every twelve (12) hours.      OXYGEN-AIR DELIVERY SYSTEMS MISC 5 L by Miscellaneous route. Currently using 3  L/min via [place]      pantoprazole (PROTONIX) 20 MG tablet Take 1 tablet (20 mg total) by mouth daily. 60 tablet 5    pen needle, diabetic (BD ULTRA-FINE SHORT PEN NEEDLE) 31 gauge x 5/16 (8 mm) Ndle Give 4 units of insulin each night 100 each 1    rosuvastatin (CRESTOR) 5 MG tablet Take 1 tablet (5 mg total) by mouth every other day. 15 tablet 11    sertraline (ZOLOFT) 25 MG tablet Take 1 tablet (25 mg total) by mouth daily. 90 tablet 3    spironolactone (ALDACTONE) 25 MG tablet Take 1 tablet (25 mg total) by mouth daily. 90 tablet 3    tafamidis (VYNDAMAX) 61 mg cap Take 1 capsule (61 mg) by mouth daily. 30 capsule 11    tiotropium-olodateroL 2.5-2.5 mcg/actuation Mist Inhale 2 puffs daily. 4 g 11    torsemide (DEMADEX) 20 MG tablet Take 2 tablets (40 mg total) by mouth daily. May also take 1 tablet (20 mg total) daily as needed (weight gain for 3lbs overnight or 5lbs in a week). 180 tablet 3    varicella-zoster gE-AS01B, PF, (SHINGRIX, PF,) 50 mcg/0.5 mL SusR injection Inject 0.5 mL into the muscle. 0.5 mL 1    ferrous sulfate 325 (65 FE) MG tablet Take 1 tablet (325 mg total) by mouth every  other day. (Patient not taking: Reported on 06/11/2022) 45 tablet 3    metOLazone (ZAROXOLYN) 5 MG tablet Take 1 tablet (5 mg total) by mouth daily as needed (when instructed by cardiology clinic). 30 tablet 1     No current facility-administered medications for this visit.       Family History:  The patient's family history includes Cancer in her father; Hypertension in her mother.    Social history:  She  reports that she quit smoking about 4 years ago. Her smoking use included cigarettes. She smoked an average of .33 packs per day. She has never used smokeless tobacco. She reports that she does not drink alcohol and does not use drugs.    Review of Systems:  As per HPI.  Rest of the review of ten systems is negative or unremarkable except as stated above.    Physical Exam:  VITAL SIGNS:   Vitals:    07/15/22 1509   BP: 120/59   Pulse: 60   SpO2: (!) 89%          Wt Readings from Last 3 Encounters:   07/15/22 94.3 kg (208 lb)   06/22/22 96.8 kg (213 lb 6.4 oz)   06/11/22 97.8 kg (215 lb 8 oz)      Today's Body mass index is 33.57 kg/m??.   Height: 167.6 cm (5' 6)  CONSTITUTIONAL: well-appearing in no acute distress  EYES: Conjunctivae and sclerae clear and anicteric.  ENT: Benign.   CARDIOVASCULAR: JVP not seen above the clavicle with HOB at 90 degrees. Rate and rhythm are regular.  There is no lifts or heaves.  Normal S1, S2. There is no murmur, gallops or rubs.  Radial and pedal pulses are 2+, bilaterally.   There is no edema to ankles, bilaterally.   RESPIRATORY: Decreased breath sounds at bases.  There are no wheezes.  GASTROINTESTINAL: Soft, non-tender, with audible bowel sounds. Abdomen nondistended.  Liver is nonpalpable.  SKIN: No rashes, ecchymosis or petechiae.  Warm, well perfused.   MUSCULOSKELETAL:  no joint swelling   NEURO/PSYCH: Appropriate mood and affect. Alert and oriented to person, place, and time. No gross motor or sensory deficits evident.    Pertinent Laboratory Studies:   Office Visit on 07/15/2022   Component Date Value Ref Range Status    BNP 07/15/2022 185.22 (H)  <=100 pg/mL Final    Sodium 07/15/2022 138  135 - 145 mmol/L Final    Potassium 07/15/2022 4.4  3.4 - 4.8 mmol/L Final    Chloride 07/15/2022 103  98 - 107 mmol/L Final    CO2 07/15/2022 26.4  20.0 - 31.0 mmol/L Final    Anion Gap 07/15/2022 9  5 - 14 mmol/L Final    BUN 07/15/2022 71 (H)  9 - 23 mg/dL Final    Creatinine 16/09/9603 2.57 (H)  0.60 - 0.80 mg/dL Final    BUN/Creatinine Ratio 07/15/2022 28   Final    eGFR [name]-EPI (2021) Female 07/15/2022 18 (L)  >=60 mL/min/1.36m2 Final    Glucose 07/15/2022 151  70 - 179 mg/dL Final    Calcium 54/08/8118 9.7  8.7 - 10.4 mg/dL Final    Magnesium 14/78/2956 2.5  1.6 - 2.6 mg/dL Final   Office Visit on 06/22/2022   Component Date Value Ref Range Status    BNP 06/22/2022 206.59 (H)  <=100 pg/mL Final    Magnesium 06/22/2022 2.6  1.6 - 2.6 mg/dL Final    Sodium 21/30/8657 140  135 - 145 mmol/L  Final    Potassium 06/22/2022 4.6  3.4 - 4.8 mmol/L Final    Chloride 06/22/2022 103  98 - 107 mmol/L Final    CO2 06/22/2022 29.6  20.0 - 31.0 mmol/L Final    Anion Gap 06/22/2022 7  5 - 14 mmol/L Final    BUN 06/22/2022 83 (H)  9 - 23 mg/dL Final    Creatinine 19/14/7829 2.83 (H)  0.60 - 0.80 mg/dL Final    BUN/Creatinine Ratio 06/22/2022 29   Final    eGFR [name]-EPI (2021) Female 06/22/2022 16 (L)  >=60 mL/min/1.55m2 Final    Glucose 06/22/2022 169  70 - 179 mg/dL Final    Calcium 56/21/3086 9.5  8.7 - 10.4 mg/dL Final   Office Visit on 06/11/2022   Component Date Value Ref Range Status    Sodium 06/11/2022 135  135 - 145 mmol/L Final    Potassium 06/11/2022 4.6  3.4 - 4.8 mmol/L Final    Chloride 06/11/2022 98  98 - 107 mmol/L Final    CO2 06/11/2022 30.4  20.0 - 31.0 mmol/L Final    Anion Gap 06/11/2022 7  5 - 14 mmol/L Final    BUN 06/11/2022 66 (H)  9 - 23 mg/dL Final    Creatinine 57/84/6962 3.20 (H)  0.60 - 0.80 mg/dL Final    BUN/Creatinine Ratio 06/11/2022 21   Final    eGFR [name]-EPI (2021) Female 06/11/2022 14 (L)  >=60 mL/min/1.52m2 Final    Glucose 06/11/2022 151  70 - 179 mg/dL Final    Calcium 95/28/4132 8.7  8.7 - 10.4 mg/dL Final    BNP 44/12/270 292.58 (H)  <=100 pg/mL Final    Magnesium 06/11/2022 3.6 (H)  1.6 - 2.6 mg/dL Final   Office Visit on 06/09/2022   Component Date Value Ref Range Status    Sodium 06/09/2022 136  135 - 145 mmol/L Final    Potassium 06/09/2022 4.6  3.4 - 4.8 mmol/L Final    Chloride 06/09/2022 97 (L)  98 - 107 mmol/L Final    CO2 06/09/2022 33.0 (H)  20.0 - 31.0 mmol/L Final    Anion Gap 06/09/2022 6  5 - 14 mmol/L Final    BUN 06/09/2022 72 (H)  9 - 23 mg/dL Final    Creatinine 53/66/4403 3.60 (H)  0.60 - 0.80 mg/dL Final    BUN/Creatinine Ratio 06/09/2022 20   Final    eGFR [name]-EPI (2021) Female 06/09/2022 12 (L)  >=60 mL/min/1.29m2 Final    Glucose 06/09/2022 160  70 - 179 mg/dL Final    Calcium 47/42/5956 8.6 (L)  8.7 - 10.4 mg/dL Final    Albumin 38/75/6433 4.0  3.4 - 5.0 g/dL Final    Total Protein 06/09/2022 7.6  5.7 - 8.2 g/dL Final    Total Bilirubin 06/09/2022 0.5  0.3 - 1.2 mg/dL Final    AST 29/51/8841 15  <=34 U/L Final    ALT 06/09/2022 8 (L)  10 - 49 U/L Final    Alkaline Phosphatase 06/09/2022 93  46 - 116 U/L Final    BNP 06/09/2022 556.31 (H)  <=100 pg/mL Final    Magnesium 06/09/2022 4.2 (H)  1.6 - 2.6 mg/dL Final   Office Visit on 06/05/2022   Component Date Value Ref Range Status    Sodium 06/05/2022 137  135 - 145 mmol/L Final    Potassium 06/05/2022 4.7  3.4 - 4.8 mmol/L Final    Chloride 06/05/2022 101  98 - 107 mmol/L Final  CO2 06/05/2022 29.5  20.0 - 31.0 mmol/L Final    Anion Gap 06/05/2022 7  5 - 14 mmol/L Final    BUN 06/05/2022 58 (H)  9 - 23 mg/dL Final    Creatinine 16/09/9603 3.23 (H)  0.60 - 0.80 mg/dL Final    BUN/Creatinine Ratio 06/05/2022 18   Final    eGFR [name]-EPI (2021) Female 06/05/2022 14 (L)  >=60 mL/min/1.42m2 Final    Glucose 06/05/2022 155  70 - 179 mg/dL Final    Calcium 54/08/8118 8.7  8.7 - 10.4 mg/dL Final    BNP 14/78/2956 451.18 (H)  <=100 pg/mL Final    Magnesium 06/05/2022 3.9 (H)  1.6 - 2.6 mg/dL Final   Admission on 21/30/8657, Discharged on 05/22/2022   Component Date Value Ref Range Status    Sodium 05/21/2022 139  135 - 145 mmol/L Final    Potassium 05/21/2022 5.0 (H)  3.4 - 4.8 mmol/L Final    Chloride 05/21/2022 106  98 - 107 mmol/L Final    CO2 05/21/2022 25.7  20.0 - 31.0 mmol/L Final    Anion Gap 05/21/2022 7  5 - 14 mmol/L Final    BUN 05/21/2022 52 (H)  9 - 23 mg/dL Final    Creatinine 84/69/6295 2.84 (H)  0.60 - 0.80 mg/dL Final    BUN/Creatinine Ratio 05/21/2022 18   Final    eGFR [name]-EPI (2021) Female 05/21/2022 16 (L)  >=60 mL/min/1.27m2 Final    Glucose 05/21/2022 145  70 - 179 mg/dL Final    Calcium 28/41/3244 8.9  8.7 - 10.4 mg/dL Final    Albumin 12/30/7251 4.0  3.4 - 5.0 g/dL Final    Total Protein 05/21/2022 7.6  5.7 - 8.2 g/dL Final    Total Bilirubin 05/21/2022 0.3  0.3 - 1.2 mg/dL Final    AST 66/44/0347 17  <=34 U/L Final    ALT 05/21/2022 <7 (L)  10 - 49 U/L Final    Alkaline Phosphatase 05/21/2022 83  46 - 116 U/L Final    WBC 05/21/2022 8.0  3.6 - 11.2 10*9/L Final    RBC 05/21/2022 4.52  3.95 - 5.13 10*12/L Final    HGB 05/21/2022 11.7  11.3 - 14.9 g/dL Final    HCT 42/59/5638 37.8  34.0 - 44.0 % Final    MCV 05/21/2022 83.6  77.6 - 95.7 fL Final    MCH 05/21/2022 25.9  25.9 - 32.4 pg Final    MCHC 05/21/2022 31.0 (L)  32.0 - 36.0 g/dL Final    RDW 75/64/3329 22.5 (H)  12.2 - 15.2 % Final    MPV 05/21/2022 9.6  6.8 - 10.7 fL Final    Platelet 05/21/2022 179  150 - 450 10*9/L Final    Anisocytosis 05/21/2022 Marked (A)  Not Present Final    Color, UA 05/21/2022 Colorless   Final    Clarity, UA 05/21/2022 Clear   Final    Specific Gravity, UA 05/21/2022 1.009  1.003 - 1.030 Final    pH, UA 05/21/2022 6.5  5.0 - 9.0 Final    Leukocyte Esterase, UA 05/21/2022 Small (A)  Negative Final    Nitrite, UA 05/21/2022 Negative  Negative Final    Protein, UA 05/21/2022 Negative  Negative Final    Glucose, UA 05/21/2022 Negative  Negative Final    Ketones, UA 05/21/2022 Negative  Negative Final    Urobilinogen, UA 05/21/2022 <2.0 mg/dL  <5.1 mg/dL Final    Bilirubin, UA 05/21/2022 Negative  Negative Final  Blood, UA 05/21/2022 Negative  Negative Final    RBC, UA 05/21/2022 1  <=4 /HPF Final    WBC, UA 05/21/2022 10 (H)  0 - 5 /HPF Final    Squam Epithel, UA 05/21/2022 1  0 - 5 /HPF Final    Bacteria, UA 05/21/2022 Rare (A)  None Seen /HPF Final    Hyaline Casts, UA 05/21/2022 7 (H)  0 - 1 /LPF Final    Urine Culture, Comprehensive 05/21/2022 Mixed Urogenital Flora   Final    Smear Review Comments 05/21/2022 See Comment (A)  Undefined Final    hsTroponin I 05/21/2022 46 (HH)  <=34 ng/L Final    EKG Ventricular Rate 05/22/2022 59  BPM Final    EKG Atrial Rate 05/22/2022 59  BPM Final    EKG P-R Interval 05/22/2022 218  ms Final    EKG QRS Duration 05/22/2022 96  ms Final    EKG Q-T Interval 05/22/2022 508  ms Final    EKG QTC Calculation 05/22/2022 502  ms Final    EKG Calculated P Axis 05/22/2022 61  degrees Final    EKG Calculated R Axis 05/22/2022 97  degrees Final    EKG Calculated T Axis 05/22/2022 49  degrees Final    QTC Fredericia 05/22/2022 505  ms Final    hsTroponin I 05/22/2022 53 (HH)  <=34 ng/L Final    delta hsTroponin I 05/22/2022 7  <=7 ng/L Final   Infusion on 05/13/2022   Component Date Value Ref Range Status    Sodium 05/13/2022 141  135 - 145 mmol/L Final    Potassium 05/13/2022    Final    Chloride 05/13/2022 101  98 - 107 mmol/L Final    CO2 05/13/2022 31.2 (H)  20.0 - 31.0 mmol/L Final    Anion Gap 05/13/2022 9  5 - 14 mmol/L Final    BUN 05/13/2022 66 (H)  9 - 23 mg/dL Final    Creatinine 13/07/6577 2.55 (H)  0.60 - 0.80 mg/dL Final    BUN/Creatinine Ratio 05/13/2022 26   Final    eGFR [name]-EPI (2021) Female 05/13/2022 19 (L)  >=60 mL/min/1.50m2 Final    Glucose 05/13/2022 102  70 - 179 mg/dL Final    Calcium 46/96/2952 9.1  8.7 - 10.4 mg/dL Final    BNP 84/13/2440 200.15 (H)  <=100 pg/mL Final    Magnesium 05/13/2022 3.3 (H)  1.6 - 2.6 mg/dL Final    WBC 10/24/2535 6.6  3.6 - 11.2 10*9/L Final    RBC 05/13/2022 4.72  3.95 - 5.13 10*12/L Final    HGB 05/13/2022 11.7  11.3 - 14.9 g/dL Final    HCT 64/40/3474 38.7  34.0 - 44.0 % Final    MCV 05/13/2022 82.1  77.6 - 95.7 fL Final    MCH 05/13/2022 24.9 (L)  25.9 - 32.4 pg Final    MCHC 05/13/2022 30.3 (L)  32.0 - 36.0 g/dL Final    RDW 25/95/6387 21.4 (H)  12.2 - 15.2 % Final    MPV 05/13/2022 9.2  6.8 - 10.7 fL Final    Platelet 05/13/2022 178  150 - 450 10*9/L Final   Office Visit on 05/13/2022   Component Date Value Ref Range Status Color, UA 05/13/2022 Yellow   Final    Clarity, UA 05/13/2022 Hazy   Final    Specific Gravity, UA 05/13/2022 1.015  1.005 - 1.030 Final    pH, UA 05/13/2022 5.5  5.0 - 9.0 Final  Leukocyte Esterase, UA 05/13/2022 Small (A)  Negative Final    Nitrite, UA 05/13/2022 Negative  Negative Final    Protein, UA 05/13/2022 Negative  Negative Final    Glucose, UA 05/13/2022 Negative  Negative Final    Ketones, UA 05/13/2022 Negative  Negative Final    Urobilinogen, UA 05/13/2022 0.2 mg/dL  0.2 - 2.0 mg/dL Final    Bilirubin, UA 05/13/2022 Negative  Negative Final    Blood, UA 05/13/2022 Small (A)  Negative Final    RBC, UA 05/13/2022 21 (H)  0 - 3 /HPF Final    WBC, UA 05/13/2022 40 (H)  0 - 3 /HPF Final    Squam Epithel, UA 05/13/2022 11 (H)  0 - 5 /HPF Final    Bacteria, UA 05/13/2022 Many (A)  None Seen /HPF Final    WBC Clumps 05/13/2022 Few (A)  None Seen /HPF Final    Urine Culture, Comprehensive 05/13/2022 >100,000 CFU/mL Klebsiella species (A)   Final    Urine Culture, Comprehensive 05/13/2022 >100,000 CFU/mL Streptococcus bovis group (A)   Final    TSH 05/13/2022 2.140  0.550 - 4.780 uIU/mL Final    Albumin 05/13/2022 4.0  3.4 - 5.0 g/dL Final    Total Protein 05/13/2022 7.8  5.7 - 8.2 g/dL Final    Total Bilirubin 05/13/2022 0.4  0.3 - 1.2 mg/dL Final    Bilirubin, Direct 05/13/2022 0.10  0.00 - 0.30 mg/dL Final    AST 45/40/9811    Final    ALT 05/13/2022 9 (L)  10 - 49 U/L Final    Alkaline Phosphatase 05/13/2022 79  46 - 116 U/L Final   Office Visit on 04/23/2022   Component Date Value Ref Range Status    Iron 04/23/2022 23 (L)  50 - 170 ug/dL Final    TIBC 91/47/8295 329  250 - 425 ug/dL Final    Iron Saturation (%) 04/23/2022 7 (L)  20 - 55 % Final    Ferritin 04/23/2022 16.4  7.3 - 270.7 ng/mL Final    WBC 04/23/2022 8.4  3.6 - 11.2 10*9/L Final    RBC 04/23/2022 4.47  3.95 - 5.13 10*12/L Final    HGB 04/23/2022 10.8 (L)  11.3 - 14.9 g/dL Final    HCT 62/13/0865 35.9  34.0 - 44.0 % Final    MCV 04/23/2022 80.3  77.6 - 95.7 fL Final    MCH 04/23/2022 24.1 (L)  25.9 - 32.4 pg Final    MCHC 04/23/2022 30.0 (L)  32.0 - 36.0 g/dL Final    RDW 78/46/9629 21.9 (H)  12.2 - 15.2 % Final    MPV 04/23/2022 8.0  6.8 - 10.7 fL Final    Platelet 04/23/2022 225  150 - 450 10*9/L Final    nRBC 04/23/2022 0  <=4 /100 WBCs Final    Neutrophils % 04/23/2022 73.5  % Final    Lymphocytes % 04/23/2022 12.0  % Final    Monocytes % 04/23/2022 9.4  % Final    Eosinophils % 04/23/2022 3.8  % Final    Basophils % 04/23/2022 1.3  % Final    Absolute Neutrophils 04/23/2022 6.2  1.8 - 7.8 10*9/L Final    Absolute Lymphocytes 04/23/2022 1.0 (L)  1.1 - 3.6 10*9/L Final    Absolute Monocytes 04/23/2022 0.8  0.3 - 0.8 10*9/L Final    Absolute Eosinophils 04/23/2022 0.3  0.0 - 0.5 10*9/L Final    Absolute Basophils 04/23/2022 0.1  0.0 - 0.1 10*9/L Final  Anisocytosis 04/23/2022 Marked (A)  Not Present Final    Creat U 04/23/2022 55.2  Undefined mg/dL Final    Albumin Quantitative, Urine 04/23/2022 <0.3  Undefined mg/dL Final    Albumin/Creatinine Ratio 04/23/2022    Final    Smear Review Comments 04/23/2022 See Comment (A)  Undefined Final    Ovalocytes 04/23/2022 Moderate (A)  Not Present Final    Burr Cells 04/23/2022 Present (A)  Not Present Final    Poikilocytosis 04/23/2022 Moderate (A)  Not Present Final       Lab Results   Component Value Date    PRO-BNP 3,420.0 (H) 07/03/2021    PRO-BNP 905.0 (H) 03/21/2021    PRO-BNP 3,796.0 (H) 12/23/2020    Creatinine 2.57 (H) 07/15/2022    Creatinine 2.83 (H) 06/22/2022    Creatinine 0.79 01/06/2011    BUN 71 (H) 07/15/2022    BUN 83 (H) 06/22/2022    BUN 20 01/06/2011    Sodium 138 07/15/2022    Sodium 140 01/06/2011    Potassium 4.4 07/15/2022    Potassium 4.1 01/06/2011    CO2 26.4 07/15/2022    CO2 30 01/06/2011    Magnesium 2.5 07/15/2022    Magnesium 2.0 01/06/2011    Total Bilirubin 0.5 06/09/2022    INR 2.15 07/01/2021    INR 1.0 01/06/2011       Lab Results   Component Value Date    Digoxin Level 0.9 10/01/2020       Lab Results   Component Value Date    TSH 2.140 05/13/2022    Cholesterol 95 12/06/2021    Triglycerides 66 12/06/2021    HDL 42 12/06/2021    Non-HDL Cholesterol 53 (L) 12/06/2021    LDL Calculated 40 12/06/2021       Lab Results   Component Value Date    WBC 8.0 05/21/2022    WBC 12.5 (H) 01/06/2011    HGB 11.7 05/21/2022    HGB 12.9 01/06/2011    HCT 37.8 05/21/2022    HCT 40.8 01/06/2011    Platelet 179 05/21/2022    Platelet 260 01/06/2011       Pertinent Test Results from Today:  None    Other pertinent records were reviewed.    The following are further history from the patient's EPIC record for reference:     Past Medical History:   Diagnosis Date    Acute kidney injury superimposed on chronic kidney disease (CMS-HCC) 10/11/2020    Acute on chronic diastolic (congestive) heart failure (CMS-HCC) 08/23/2020    AKI (acute kidney injury) (CMS-HCC) 04/14/2015    Lab Results  Component  Value  Date     CREATININE  1.90 (H)  06/12/2021     Had a bump in her creatinine when she was taking her diuretics every day.  She is currently taking 40 mg daily of torsemide and 50 mg of spironolactone.  Her volume status is fragile.  Previously when she stopped her diuretic she becomes short of breath.  Plan: We will check her BMP today.  We will likely have to go to 40    Arthritis     Calculus of kidney     Calculus of ureter     CHF (congestive heart failure) (CMS-HCC)     Chronic atrial fibrillation (CMS-HCC) 07/20/2019    COPD (chronic obstructive pulmonary disease) (CMS-HCC)     Diabetes (CMS-HCC)     Gangrenous cholecystitis 10/11/2020    GERD (gastroesophageal reflux disease)  Hydronephrosis     Hypertension     Hyponatremia 10/11/2020    Intermediate coronary syndrome (CMS-HCC) 03/13/2014    Lower extremity edema 09/28/2020    Lumbar stenosis     Microscopic hematuria     Nausea alone     Nephrolithiasis 04/17/2016    Neuropathy     Nocturia     Other chronic cystitis     Pulmonary hypertension (CMS-HCC)     Renal colic     Sleep apnea     Unstable angina pectoris (CMS-HCC) 03/13/2014       Past Surgical History:   Procedure Laterality Date    BACK SURGERY  1995    CARPAL TUNNEL RELEASE Left 2014    HYSTERECTOMY  1971    IR INSERT CHOLECYSTOSMY TUBE PERCUTANEOUS  10/02/2020    IR INSERT CHOLECYSTOSMY TUBE PERCUTANEOUS 10/02/2020 Braulio Conte, MD IMG VIR H&V [facility]    LUMBAR DISC SURGERY      PR REMOVAL GALLBLADDER N/A 10/06/2020    Procedure: CHOLECYSTECTOMY;  Surgeon: Katherina Mires, MD;  Location: MAIN OR [facility];  Service: Trauma    PR RIGHT HEART CATH O2 SATURATION & CARDIAC OUTPUT N/A 09/30/2020    Procedure: Right Heart Catheterization;  Surgeon: Neal Dy, MD;  Location: [facility] CATH;  Service: Cardiology    PR RIGHT HEART CATH O2 SATURATION & CARDIAC OUTPUT N/A 01/09/2021    Procedure: Right Heart Catheterization;  Surgeon: Lesle Reek, MD;  Location: [facility] CATH;  Service: Cardiology

## 2022-07-20 MED ORDER — FLUTICASONE PROPIONATE 115 MCG-SALMETEROL 21 MCG/ACTUATION HFA INHALER
Freq: Two times a day (BID) | RESPIRATORY_TRACT | 11 refills | 30 days | Status: CP
Start: 2022-07-20 — End: 2023-07-20

## 2022-07-20 MED ORDER — TIOTROPIUM BROMIDE 18 MCG CAPSULE WITH INHALATION DEVICE
ORAL_CAPSULE | Freq: Every day | RESPIRATORY_TRACT | 11 refills | 30 days | Status: CP
Start: 2022-07-20 — End: 2023-07-20

## 2022-07-20 NOTE — Unmapped (Signed)
Received multiple calls this AM from daughter requesting prescription refills for Advair and Spiriva. Patient is unable to tolerate Stiolto due to too many side effects. While waiting for MD to clarify which med/strangth needed to be ordered, cardiology entered the orders. I notified the patient that scripts have been sent.

## 2022-07-22 ENCOUNTER — Telehealth: Admit: 2022-07-22 | Discharge: 2022-07-23 | Payer: MEDICARE

## 2022-07-22 DIAGNOSIS — G629 Polyneuropathy, unspecified: Principal | ICD-10-CM

## 2022-07-22 DIAGNOSIS — I4819 Other persistent atrial fibrillation: Principal | ICD-10-CM

## 2022-07-22 DIAGNOSIS — E1122 Type 2 diabetes mellitus with diabetic chronic kidney disease: Principal | ICD-10-CM

## 2022-07-22 DIAGNOSIS — Z794 Long term (current) use of insulin: Principal | ICD-10-CM

## 2022-07-22 DIAGNOSIS — N1832 Type 2 diabetes mellitus with stage 3b chronic kidney disease, without long-term current use of insulin (CMS-HCC): Principal | ICD-10-CM

## 2022-07-22 DIAGNOSIS — N184 Chronic kidney disease, stage 4 (severe): Principal | ICD-10-CM

## 2022-07-22 DIAGNOSIS — I5032 Chronic diastolic (congestive) heart failure: Principal | ICD-10-CM

## 2022-07-22 DIAGNOSIS — G6289 Other specified polyneuropathies: Principal | ICD-10-CM

## 2022-07-22 MED ORDER — SPIRIVA RESPIMAT 2.5 MCG/ACTUATION SOLUTION FOR INHALATION
Freq: Every day | RESPIRATORY_TRACT | 11 refills | 0.00000 days | Status: CP
Start: 2022-07-22 — End: 2022-07-22

## 2022-07-22 MED ORDER — GABAPENTIN 300 MG CAPSULE
ORAL_CAPSULE | Freq: Every day | ORAL | 3 refills | 90 days | Status: CP
Start: 2022-07-22 — End: 2023-07-22

## 2022-07-22 NOTE — Unmapped (Signed)
Internal Medicine Clinic Visit  Person Contacted: Patient  Contact Phone number: [phone] (home)   Is there someone else in the room? Yes. What is your relationship? daughter. Do you want this person here for the visit? Yes.  Reason for visit: follow up     A/P:    Type 2 diabetes mellitus with stage 3b chronic kidney disease, without long-term current use of insulin (CMS-HCC)  Overview:  Lab Results   Component Value Date    A1C 5.6 03/13/2022    A1C 7.0 (H) 12/06/2021    A1C 5.9 08/27/2021     Diabetes, on lantus 4 unit qhs at bedtime. Due to low GFR, off other meds. Did not tolerate jardiance, due to UTI  BS s at home in range, no hypoglycemia  Plan: cont. 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 Followed by Dr Emi Holes  Most recent Echo12/2022:   Summary    1. The left ventricle is normal in size with severely increased wall  thickness.    2. The left ventricular systolic function is hyperdynamic, LVEF is visually  estimated at >70%.    3. There is grade III diastolic dysfunction (severely elevated filling  pressure).    4. The left atrium is moderately to severely dilated in size.    5. The right ventricle is mildly dilated in size, with normal systolic  function.    6. The right atrium is mildly dilated  in size.    7. IVC size and inspiratory change suggest elevated right atrial pressure.  (10-20 mmHg).  On amiodarone 200 mg every day, torsemide 40 mg every day, tafamidis for TTR amyloid, spirinolactone 25 mg  Has iron infusions as needed  Wt stable  Euvolemic today  Plan:   Cont current meds  Follows up with [facility] cardiology       Persistent atrial fibrillation (CMS-HCC)  Overview:  On amiodarone 200 mg every day,  EKG: NSR, 04/2022    Off apixaban, due to downtrending Hb  Plan: continue treatment        Other polyneuropathy  Overview:  Last Assessment & Plan:   Stable unchanged chronic bilateral stocking LE neuropathy, with reduced sensation and pain. Also concern complicated by RLS  In setting of DM and lumbar spinal stenosis history of radiculopathy, multifactorial  Followed by Pain Management (Dr Ronita Hipps, [facility] [place])  - On Oxycontin, , gabapentin 300 mg at bedtime (dose way down from original dose)   Plan: cont meds        Chronic kidney disease ([name]), stage IV (severe) (CMS-HCC)  Overview:  Lab Results   Component Value Date    CREATININE 2.57 (H) 07/15/2022     Creatinine stable  Anticipate maintaining current dose of torsemide 40 mg every day. Also on spirinolactone.     Follows with [facility] nephrology          Return in about 3 months (around 10/22/2022).    HPI:   Pt is a 81 y.o. 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    Feels like her breathing is okay: 130/72. Heart rate: 57.   Weight is stable, legs are not swollen.   Wt is 207. Taking torsemide 40 mg every day.   Leg pain: better.  Taking 300 mg gabapentin at night.   Blood sugars: 104 this morning, no lows. Takes 4 units of lantus.   Is on 3 liters of oxygen.   Jerking motion is gone. Got better when her magnesium levels went down.   Is going to the beach this weekend.     Covid vaccines:   Lab Results   Component Value Date    A1C 5.6 03/13/2022       Lab Results   Component Value Date    CREATININE 2.57 (H) 07/15/2022     Lab Results   Component Value Date    K 4.4 07/15/2022       Problem List:  Patient Active Problem List   Diagnosis    Spinal stenosis of lumbar region    Thoracic or lumbosacral neuritis or radiculitis    Lumbosacral spondylosis    COPD (chronic obstructive pulmonary disease) (CMS-HCC)    Sleep apnea in adult    Essential hypertension    Type 2 diabetes mellitus with stage 3b chronic kidney disease, without long-term current use of insulin (CMS-HCC)    Chronic cystitis    Incomplete bladder emptying    Peripheral neuropathy Chronic respiratory failure with hypoxia (CMS-HCC)    Anemia    Persistent atrial fibrillation (CMS-HCC)    Anemia in chronic kidney disease    Chronic kidney disease ([name]), stage IV (severe) (CMS-HCC)    Enrolled in chronic care management    Pulmonary hypertension (CMS-HCC)    Acute on chronic diastolic congestive heart failure (CMS-HCC)    Chronic prescription opiate use    Dyspnea on exertion    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)    Cardiac amyloidosis (CMS-HCC)    Recurrent cold sores    Dyspepsia    Iron deficiency anemia due to chronic blood loss    Neuropathy       Medications:  Reviewed in EPIC    Social history:   Lives with daughter and son in  law      Physical Exam:   Vital Signs:  BP Readings from Last 3 Encounters:   07/22/22 130/72   07/15/22 120/59   06/22/22 120/57      Wt Readings from Last 3 Encounters:   07/22/22 94.3 kg (208 lb)   07/15/22 94.3 kg (208 lb)   06/22/22 96.8 kg (213 lb 6.4 oz)      General appearance -   Wearing oxygen, no resp distress    Records review  Lab Results   Component Value Date    A1C 5.6 03/13/2022        Health Maintenance Due   Topic Date Due    DEXA Scan  Never done    Retinal Eye Exam  Never done    DTaP/Tdap/Td Vaccines (1 - Tdap) Never done    Foot Exam  02/04/2022    COVID-19 Vaccine (5 - Pfizer series) 03/14/2022      The ASCVD Risk score (Arnett DK, et al., 2019) failed to calculate.     Medication adherence and barriers to the treatment plan have been addressed. Opportunities to optimize healthy behaviors have been discussed. Patient / caregiver voiced understanding.      The patient reports they are currently: at home. I spent 8 minutes on the real-time audio and video with the patient on the date of service. I spent an additional 18 minutes on pre- and post-visit activities on the date of service.     The  patient was physically located in [place] or a state in which I am permitted to provide care. The patient and/or parent/guardian understood that s/he may incur co-pays and cost sharing, and agreed to the telemedicine visit. The visit was reasonable and appropriate under the circumstances given the patient's presentation at the time.    The patient and/or parent/guardian has been advised of the potential risks and limitations of this mode of treatment (including, but not limited to, the absence of in-person examination) and has agreed to be treated using telemedicine. The patient's/patient's family's questions regarding telemedicine have been answered.     If the visit was completed in an ambulatory setting, the patient and/or parent/guardian has also been advised to contact their provider???s office for worsening conditions, and seek emergency medical treatment and/or call 911 if the patient deems either necessary.

## 2022-07-22 NOTE — Unmapped (Signed)
Approval Details    Authorized from December 28, 2021 to December 27, 2022

## 2022-08-03 NOTE — Unmapped (Signed)
[facility] Clinical Assessment & Refill Coordination Note    Barbara Huber, DOB: 19-Nov-1941  Phone: [phone] (home)     All above HIPAA information was verified with patient.     Was a Nurse, learning disability used for this call? No    Specialty Medication(s):   General Specialty: Vyndamax     Current Outpatient Medications   Medication Sig Dispense Refill    ACCU-CHEK AVIVA PLUS TEST STRP Strp UESE TO CHECK BLOOD SUGAR 3 TIMES A DAY BEFORE MEALS 100 each 11    acetaminophen (TYLENOL) 500 MG tablet Take 2 tablets (1,000 mg total) by mouth daily as needed for pain.      albuterol HFA 90 mcg/actuation inhaler Inhale 2 puffs every eight (8) hours as needed for wheezing.      amiodarone (PACERONE) 200 MG tablet Take 1 tablet (200 mg total) by mouth daily. 90 tablet 3    cranberry 500 mg cap Take 500 mg by mouth daily with evening meal.      estradioL (ESTRACE) 0.01 % (0.1 mg/gram) vaginal cream INSERT ONE GRAM VAGINALLY AT BEDTIME      fluticasone propion-salmeteroL (ADVAIR HFA) 115-21 mcg/actuation inhaler Inhale 2 puffs Two (2) times a day. 12 g 11    fosfomycin (MONUROL) 3 gram Pack Take 3 g by mouth once a week. 36 g 3    gabapentin (NEURONTIN) 300 MG capsule Take 1 capsule (300 mg total) by mouth in the morning. 90 capsule 3    insulin glargine (BASAGLAR, LANTUS) 100 unit/mL (3 mL) injection pen Inject 0.04 mL (4 Units total) under the skin nightly. 15 mL 3    lancets Misc 1 each by Miscellaneous route daily. Accu Check 100 each 6    metOLazone (ZAROXOLYN) 5 MG tablet Take 1 tablet (5 mg total) by mouth daily as needed (when instructed by cardiology clinic). 30 tablet 1    montelukast (SINGULAIR) 10 mg tablet Take 1 tablet (10 mg total) by mouth in the morning. 90 tablet 2    NARCAN 4 mg/actuation nasal spray 1 spray into alternating nostrils once as needed (opioid overdose). PRN - Emergency use.      NON FORMULARY APPLY FROM NECK DOWN TWICE A DAY      OXYCONTIN 10 mg TR12 12 hr crush resistant ER/CR tablet Take 1 tablet (10 mg total) by mouth every twelve (12) hours.      OXYGEN-AIR DELIVERY SYSTEMS MISC 5 L by Miscellaneous route. Currently using 3  L/min via [place]      pantoprazole (PROTONIX) 20 MG tablet Take 1 tablet (20 mg total) by mouth daily. 60 tablet 5    pen needle, diabetic (BD ULTRA-FINE SHORT PEN NEEDLE) 31 gauge x 5/16 (8 mm) Ndle Give 4 units of insulin each night 100 each 1    rosuvastatin (CRESTOR) 5 MG tablet Take 1 tablet (5 mg total) by mouth every other day. 15 tablet 11    sertraline (ZOLOFT) 25 MG tablet Take 1 tablet (25 mg total) by mouth daily. 90 tablet 3    spironolactone (ALDACTONE) 25 MG tablet Take 1 tablet (25 mg total) by mouth daily. 90 tablet 3    tafamidis (VYNDAMAX) 61 mg cap Take 1 capsule (61 mg) by mouth daily. 30 capsule 11    tiotropium bromide (SPIRIVA RESPIMAT) 2.5 mcg/actuation inhalation mist Inhale 2 puffs daily. 4 g 11    torsemide (DEMADEX) 20 MG tablet Take 2 tablets (40 mg total) by mouth daily. May also take  1 tablet (20 mg total) daily as needed (weight gain for 3lbs overnight or 5lbs in a week). 180 tablet 3    varicella-zoster [id number]-AS01B, PF, (SHINGRIX, PF,) 50 mcg/0.5 mL SusR injection Inject 0.5 mL into the muscle. 0.5 mL 1     No current facility-administered medications for this visit.        Changes to medications: Khila reports no changes at this time.    Allergies   Allergen Reactions    Nitrofurantoin Anaphylaxis and Other (See Comments)    Lipitor [Atorvastatin] Muscle Pain     SOB, Headache, fatigue, sick on my stomach       Changes to allergies: No    SPECIALTY MEDICATION ADHERENCE     Vyndamax 61 mg: 10 days of medicine on hand       Medication Adherence    Patient reported X missed doses in the last month: 0  Specialty Medication: Vyndamax 61mg   Informant: patient                            Specialty medication(s) dose(s) confirmed: Regimen is correct and unchanged.     Are there any concerns with adherence? No    Adherence counseling provided? Not needed    CLINICAL MANAGEMENT AND INTERVENTION      Clinical Benefit Assessment:    Do you feel the medicine is effective or helping your condition? Yes    Clinical Benefit counseling provided? Not needed    Adverse Effects Assessment:    Are you experiencing any side effects? No    Are you experiencing difficulty administering your medicine? No    Quality of Life Assessment:       How many days over the past month did your cardiac amyloidosis  keep you from your normal activities? For example, brushing your teeth or getting up in the morning. Patient declined to answer    Have you discussed this with your provider? Not needed    Acute Infection Status:    Acute infections noted within Epic:  No active infections  Patient reported infection: None    Therapy Appropriateness:    Is therapy appropriate and patient progressing towards therapeutic goals? Yes, therapy is appropriate and should be continued    DISEASE/MEDICATION-SPECIFIC INFORMATION      N/A    PATIENT SPECIFIC NEEDS     Does the patient have any physical, cognitive, or cultural barriers? No    Is the patient high risk? No    Does the patient require a Care Management Plan? No     SOCIAL DETERMINANTS OF HEALTH     At the [facility], we have learned that life circumstances - like trouble affording food, housing, utilities, or transportation can affect the health of many of our patients.   That is why we wanted to ask: are you currently experiencing any life circumstances that are negatively impacting your health and/or quality of life? Patient declined to answer    Social Determinants of Health     Financial Resource Strain: Low Risk  (12/08/2021)    Overall Financial Resource Strain (CARDIA)     Difficulty of Paying Living Expenses: Not hard at all   Internet Connectivity: Not on file   Food Insecurity: No Food Insecurity (12/08/2021)    Hunger Vital Sign     Worried About Running Out of Food in the Last Year: Never true Ran Out of Food in the Last Year: Never  true   Tobacco Use: Medium Risk (07/22/2022)    Patient History     Smoking Tobacco Use: Former     Smokeless Tobacco Use: Never     Passive Exposure: Not on file   Housing/Utilities: Low Risk  (12/08/2021)    Housing/Utilities     Within the past 12 months, have you ever stayed: outside, in a car, in a tent, in an overnight shelter, or temporarily in someone else's home (i.e. couch-surfing)?: No     Are you worried about losing your housing?: No     Within the past 12 months, have you been unable to get utilities (heat, electricity) when it was really needed?: No   Alcohol Use: Not At Risk (07/22/2022)    Alcohol Use     How often do you have a drink containing alcohol?: Never     How many drinks containing alcohol do you have on a typical day when you are drinking?: 1 - 2     How often do you have 5 or more drinks on one occasion?: Never   Transportation Needs: No Transportation Needs (12/08/2021)    PRAPARE - Transportation     Lack of Transportation (Medical): No     Lack of Transportation (Non-Medical): No   Substance Use: Not on file   Health Literacy: Medium Risk (05/02/2021)    Health Literacy     : Rarely   Physical Activity: Not on file   Interpersonal Safety: Not on file   Stress: Not on file   Intimate Partner Violence: Not At Risk (12/17/2021)    Humiliation, Afraid, Rape, and Kick questionnaire     Fear of Current or Ex-Partner: No     Emotionally Abused: No     Physically Abused: No     Sexually Abused: No   Depression: Not at risk (04/22/2022)    PHQ-2     PHQ-2 Score: 0   Social Connections: Not on file       Would you be willing to receive help with any of the needs that you have identified today? Not applicable       SHIPPING     Specialty Medication(s) to be Shipped:   General Specialty: Vyndamax    Other medication(s) to be shipped: No additional medications requested for fill at this time     Changes to insurance: No    Delivery Scheduled: Yes, Expected medication delivery date: 08/11/22.     Medication will be delivered via Next Day Courier to the confirmed prescription address in [facility].    The patient will receive a drug information handout for each medication shipped and additional FDA Medication Guides as required.  Verified that patient has previously received a Conservation officer, historic buildings and a Surveyor, mining.    The patient or caregiver noted above participated in the development of this care plan and knows that they can request review of or adjustments to the care plan at any time.      All of the patient's questions and concerns have been addressed.    Camillo Flaming   [facility] Specialty Pharmacist

## 2022-08-06 DIAGNOSIS — I5032 Chronic diastolic (congestive) heart failure: Principal | ICD-10-CM

## 2022-08-06 DIAGNOSIS — E1122 Type 2 diabetes mellitus with diabetic chronic kidney disease: Principal | ICD-10-CM

## 2022-08-06 DIAGNOSIS — N1832 Type 2 diabetes mellitus with stage 3b chronic kidney disease, without long-term current use of insulin (CMS-HCC): Principal | ICD-10-CM

## 2022-08-06 NOTE — Unmapped (Signed)
[facility] Internal Medicine   CHRONIC CARE MANAGEMENT OUTREACH ENCOUNTER           Date of Service:  08/06/2022      Service:  Care Coordination - phone    Patient has elected to discontinue their chronic care management services with their provider at this time.  Reviewed with patient that revocation would be effective at the end of the calendar month in which we are submitting this revocation and that they will not receive additional chronic care management services after that date unless they were to sign a new consent.     Chronic Care Management (CCM) Outreach  Purpose of outreach:  COPD , Diabetes, and Hypertension   Care Manager (CM) completed the following:  Reviewed chart  Identified following:   Previously discussed graduation from [facility] program during June outreach.   Patient did not want to unenroll from program at the time.  07/22/22 Visit with Dr. Brooke Dare:  A1c at goal.   CHF: follows with [facility] Cardiology  [name]: follows with [facility] Nephrology  CCM Care Plan:   Meeting HTN goal of BP <140/90 (last readings: 120/59, 130/72)  Meeting DM goal of A1c < 7.0%  Placed call to Ms. Franca and discussed the following:   She shared that she is doing well.   Discussed CCM care goals and possibility of graduation OR offered to change frequency of outreach.   She feels comfortable with graduating from CCM.     A copy of this Patient Outreach/CCM Encounter was sent to patient's Primary Care Provider     CCM Documentation  Time spent in direct care with patient and/or health care proxy via non-in-person encounter(s): 15  Time spent in indirect patient care and coordination: 25

## 2022-08-07 NOTE — Unmapped (Signed)
I was the supervising physician in the delivery of this service. Gena Laski

## 2022-08-10 MED FILL — VYNDAMAX 61 MG CAPSULE: ORAL | 30 days supply | Qty: 30 | Fill #7

## 2022-08-19 ENCOUNTER — Ambulatory Visit: Admit: 2022-08-19 | Discharge: 2022-08-20 | Payer: MEDICARE | Attending: Adult Health | Primary: Adult Health

## 2022-08-19 LAB — BASIC METABOLIC PANEL
ANION GAP: 9 mmol/L (ref 5–14)
BLOOD UREA NITROGEN: 54 mg/dL — ABNORMAL HIGH (ref 9–23)
BUN / CREAT RATIO: 23
CALCIUM: 9.1 mg/dL (ref 8.7–10.4)
CHLORIDE: 105 mmol/L (ref 98–107)
CO2: 26.3 mmol/L (ref 20.0–31.0)
CREATININE: 2.35 mg/dL — ABNORMAL HIGH
EGFR CKD-EPI (2021) FEMALE: 20 mL/min/{1.73_m2} — ABNORMAL LOW (ref >=60)
GLUCOSE RANDOM: 109 mg/dL (ref 70–179)
POTASSIUM: 4.4 mmol/L (ref 3.4–4.8)
SODIUM: 140 mmol/L (ref 135–145)

## 2022-08-19 LAB — FERRITIN: FERRITIN: 178 ng/mL

## 2022-08-19 LAB — B-TYPE NATRIURETIC PEPTIDE: B-TYPE NATRIURETIC PEPTIDE: 167.03 pg/mL — ABNORMAL HIGH (ref <=100)

## 2022-08-19 LAB — IRON PANEL
IRON SATURATION: 30 % (ref 20–55)
IRON: 74 ug/dL
TOTAL IRON BINDING CAPACITY: 243 ug/dL — ABNORMAL LOW (ref 250–425)

## 2022-08-19 LAB — CBC
HEMATOCRIT: 42 % (ref 34.0–44.0)
HEMOGLOBIN: 13.6 g/dL (ref 11.3–14.9)
MEAN CORPUSCULAR HEMOGLOBIN CONC: 32.5 g/dL (ref 32.0–36.0)
MEAN CORPUSCULAR HEMOGLOBIN: 29.7 pg (ref 25.9–32.4)
MEAN CORPUSCULAR VOLUME: 91.5 fL (ref 77.6–95.7)
MEAN PLATELET VOLUME: 9.2 fL (ref 6.8–10.7)
PLATELET COUNT: 163 10*9/L (ref 150–450)
RED BLOOD CELL COUNT: 4.58 10*12/L (ref 3.95–5.13)
RED CELL DISTRIBUTION WIDTH: 15.3 % — ABNORMAL HIGH (ref 12.2–15.2)
WBC ADJUSTED: 7.9 10*9/L (ref 3.6–11.2)

## 2022-08-19 LAB — MAGNESIUM: MAGNESIUM: 2.5 mg/dL (ref 1.6–2.6)

## 2022-08-19 NOTE — Unmapped (Signed)
Today,    MEDICATIONS:  NO medication changes today.    Call if you have questions about your medications.    LABS:  We will call you if your labs need attention.    NEXT APPOINTMENT:  Return to clinic in 6 weeks with me - 10/26 at 12:30pm      In general, to take care of your heart failure:  -Limit your fluid intake to 2 Liters (half-gallon) per day.    -Limit your salt intake to ideally 2-3 grams (2000-3000 mg) per day.  -Weigh yourself daily and record, and bring that weight diary to your next appointment.  (Weight gain of 2-3 pounds in 1 day typically means fluid weight.)    The medications for your heart are to help your heart and help you live longer.    Please contact us before stopping any of your heart medications.    Call the clinic at [phone] with questions.  Our clinic fax number is [phone].  If you need to reschedule future appointments, please call [phone] or [phone]  My office number (c/o De Burrs RN) is [phone] if you need further assistance.  After office hours, if you have urgent questions/problems, contact the on-call cardiologist through the hospital operator: [phone].    Please do not send a MyChart message for potentially life-threatening symptoms.  Please call 911 for a true medical emergency.    To learn more about heart failure, please read [facility]'s Learning to Live with Heart Failure - Available online at:  [email or web address] - open the window for Medical Management and click the link Living with Heart Failure   (Can search [facility] heart failure on the web to find the link.)

## 2022-08-19 NOTE — Unmapped (Signed)
[facility] HF Amyloid Clinic Note    Referring Provider: None Per Patient Referring  [address]  [place],  [place] [address]   Primary Provider: Jacquiline Doe, MD  [address]  [place] [address]   Other Providers:  Dr Barbara Huber    Reason for Visit:  Barbara Huber is a 81 y.o. female being seen for routine visit and continued care of cardiac amyloidosis .    Assessment & Plan:  1. 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   - Moderately increased wall thickness with low voltage ECG - she's had issues with hypotension on ARB and BB, as well as atrial arrhythmias. SPEP/UPEP/free light chains without concern. 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 She has one daughter who accompanies her and is informed.   - DC'd Jardiance 10mg  daily due to recurrent UTIs  - Weight is at baseline (~210-212lbs), even a bit lower with stable renal function. Continue torsemide 40mg  daily with extra 20mg  PRN.   - Continue Tafamidis 61 mg daily   - Continue spironolactone 25 mg daily   - She is not interested in CardioMEMS at this time    2. Persistent atrial fibrillation  - CHA2DS2-VASc score = 5 (1-gender, 2-Age, 1-DM, 1-HTN)  - We stopped Eliquis given recurrent anemia, shared decision making w/ pt and daughter. She is not interested in [place] (or any procedures) at this time  - She had been in afib since at least 07/2020; now s/p cardioversion 01/10/21 and continues to be in NSR since  - On amiodarone 200mg  daily - increased it back herself after seeing no improvement in tremors/jerking with decreased dose  - amiodarone monitoring - last PFTs in 04/2021. Last TSH 04/2022, LFTs 05/2022    3. [name]  - Cr staying around 2.3-2.5; 2.57 today    4. COPD  - home O2 3L Barbara Huber - followed by pulm  - on Advair and Spiriva    5. Iron deficiency anemia  - Finsished IV Iron - can recheck iron panel at follow up    6. Anxiety  - Tolerating Zoloft 25mg  with improved moved/anxiety    Follow-up:  No follow-ups on file.     History of Present Illness:  Barbara Huber is a 81 y.o. female with PMHx of COPD on 3L home [place], CHF, [name], [name], [name] She was initially admitted with decompensated [name] in 2021 in setting of  Septic Shock d/t Gangrenous Cholecystitis s/p cholecystectomy. She underwent further evaluation with PYP scan which showed TTR amyloid. She has since been followed in clinic for management of her [name]. In 2022, had numerous admissions for weakness/urosepsis, shortness of breath/orthopnea/weight gain, lightheadedness/SOB/Blurry vision found to be anemic.    Summary of most recent visits:  - 01/16/22 Barbara Huber): Feeling well from a cardiac standpoint. Started Zoloft for anxiety  - 02/13/22 Barbara Huber): Weight 210-211. Feeling well. Taking extra torsemide 20mg  about every other day, which was stopped for elevated Cr.   - 03/13/22 (Barbara Huber): Weight 201-216, took metolazone PRN, weight stabilized at 210lbs. Considering cardioMEMS.   - 05/13/22 (Barbara Huber): Had 3 iron infusions. Doing well w/ volume, weight 211-212lbs. Cooking, staying active around house.   - 05/21/22 ([name]): CP/SOB. Weight 217 from 213. She took metolazone x1 dose.   -  05/22/22 (Barbara Huber): Ed follow up. Back down to 213lbs. Still having CP w/ certain movement.   - 06/05/22 (Barbara Huber): Weight 214-216lb, should be 212lb.   - 06/09/22, 06/11/22, 06/22/22 ([name]): Weight down from 217 to 213lb, 211lb at home. Feeling well by 6/26.     Interval history  Today she presents for follow up. She had been feeling better after her last visit, but then starting feeling groggy and weak again. They had to increase her O2 from 3L to 5L because her O2 sat was running in the 80s. Her daughter got to thinking about recent med changes and decided to stop her new inhaler (Stiolto) and go back to Advair/Spiriva. That seems to be working much better. Her O2 sat is normal and they were able to turn her O2 back down to baseline. She is more alert and not groggy anymore. Weight is down to 207lbs at home, taking torsemide 40mg  daily and none in the afternoon. She is starting to feel more like herself.     Jumped up last night and gained 2lbs from 209 to 211, but staying 208-210. She ate a candy bar and a hot dog.   Taking 2 torsemide a day    Old inhalers are better        Cardiovascular History & Procedures:    Cath / PCI:  none    CV Surgery:   none    EP Procedures and Devices:  none    Non-Invasive Evaluation(s):    Echo:  12/09/21  Summary    1. The left ventricle is normal in size with severely increased wall  thickness.    2. The left ventricular systolic function is hyperdynamic, LVEF is visually  estimated at >70%.    3. There is grade III diastolic dysfunction (severely elevated filling  pressure).    4. The left atrium is moderately to severely dilated in size.    5. The right ventricle is mildly dilated in size, with normal systolic  function.    6. The right atrium is mildly dilated  in size.    7. IVC size and inspiratory change suggest elevated right atrial pressure.  (10-20 mmHg).    04/24/21   1. The left ventricle is normal in size with mildly increased wall  thickness.    2. The left ventricular systolic function is normal, LVEF is visually  estimated at > 55%.    3. There is grade II diastolic dysfunction (elevated filling pressure).    4. Mitral annular calcification is present (mild).    5. The left atrium is mildly dilated in size.    6. The right ventricle is mildly dilated in size, with low normal systolic  function.    7. IVC size and inspiratory change suggest elevated right atrial pressure.  (10-20 mmHg).    09/30/20  Summary    1. Limited study to assess systolic function.    2. IVC size and inspiratory change suggest normal right atrial pressure.  (0-5 mmHg).    3. The left ventricle is normal in size with mildly to moderately increased  wall thickness.    4. The left ventricular systolic function is normal with no obvious wall  motion abnormalities, LVEF is visually estimated at > 55%.    5. Mitral annular calcification is present.    6. The left atrium is mildly dilated in size.    7. The right ventricle is upper normal in size, with reduced systolic  Function.  Cardiac CT/MRI/Nuclear Tests:   None    6 Minute Walk:  None    Cardiopulmonary Stress Tests:   None               Other Past Medical History:  See below for the complete EPIC list of past medical and surgical history.      Allergies:  Nitrofurantoin and Lipitor [atorvastatin]    Current Medications:  Current Outpatient Medications   Medication Sig Dispense Refill    ACCU-CHEK AVIVA PLUS TEST STRP Strp UESE TO CHECK BLOOD SUGAR 3 TIMES A DAY BEFORE MEALS 100 each 11    acetaminophen (TYLENOL) 500 MG tablet Take 2 tablets (1,000 mg total) by mouth daily as needed for pain.      albuterol HFA 90 mcg/actuation inhaler Inhale 2 puffs every eight (8) hours as needed for wheezing.      amiodarone (PACERONE) 200 MG tablet Take 1 tablet (200 mg total) by mouth daily. 90 tablet 3    cranberry 500 mg cap Take 500 mg by mouth daily with evening meal.      estradioL (ESTRACE) 0.01 % (0.1 mg/gram) vaginal cream INSERT ONE GRAM VAGINALLY AT BEDTIME      fluticasone propion-salmeteroL (ADVAIR HFA) 115-21 mcg/actuation inhaler Inhale 2 puffs Two (2) times a day. 12 g 11    fosfomycin (MONUROL) 3 gram Pack Take 3 g by mouth once a week. 36 g 3    gabapentin (NEURONTIN) 300 MG capsule Take 1 capsule (300 mg total) by mouth in the morning. 90 capsule 3    insulin glargine (BASAGLAR, LANTUS) 100 unit/mL (3 mL) injection pen Inject 0.04 mL (4 Units total) under the skin nightly. 15 mL 3    lancets Misc 1 each by Miscellaneous route daily. Accu Check 100 each 6    montelukast (SINGULAIR) 10 mg tablet Take 1 tablet (10 mg total) by mouth in the morning. 90 tablet 2    NON FORMULARY APPLY FROM NECK DOWN TWICE A DAY      OXYCONTIN 10 mg TR12 12 hr crush resistant ER/CR tablet Take 1 tablet (10 mg total) by mouth every twelve (12) hours.      OXYGEN-AIR DELIVERY SYSTEMS MISC 5 L by Miscellaneous route. Currently using 3  L/min via [place]      pantoprazole (PROTONIX) 20 MG tablet Take 1 tablet (20 mg total) by mouth daily. 60 tablet 5    pen needle, diabetic (BD ULTRA-FINE SHORT PEN NEEDLE) 31 gauge x 5/16 (8 mm) Ndle Give 4 units of insulin each night 100 each 1    rosuvastatin (CRESTOR) 5 MG tablet Take 1 tablet (5 mg total) by mouth every other day. 15 tablet 11    sertraline (ZOLOFT) 25 MG tablet Take 1 tablet (25 mg total) by mouth daily. 90 tablet 3    spironolactone (ALDACTONE) 25 MG tablet Take 1 tablet (25 mg total) by mouth daily. 90 tablet 3    tafamidis (VYNDAMAX) 61 mg cap Take 1 capsule (61 mg) by mouth daily. 30 capsule 11    tiotropium bromide (SPIRIVA RESPIMAT) 2.5 mcg/actuation inhalation mist Inhale 2 puffs daily. 4 g 11    torsemide (DEMADEX) 20 MG tablet Take 2 tablets (40 mg total) by mouth daily. May also take 1 tablet (20 mg total) daily as needed (weight gain for 3lbs overnight or 5lbs in a week). 180 tablet 3    varicella-zoster [id number], PF, (SHINGRIX, PF,) 50 mcg/0.5 mL SusR injection Inject 0.5 mL into the muscle.  0.5 mL 1    metOLazone (ZAROXOLYN) 5 MG tablet Take 1 tablet (5 mg total) by mouth daily as needed (when instructed by cardiology clinic). 30 tablet 1    NARCAN 4 mg/actuation nasal spray 1 spray into alternating nostrils once as needed (opioid overdose). PRN - Emergency use. (Patient not taking: Reported on 08/19/2022)       No current facility-administered medications for this visit.       Family History:  The patient's family history includes Cancer in her father; Hypertension in her mother.    Social history:  She  reports that she quit smoking about 4 years ago. Her smoking use included cigarettes. She smoked an average of .33 packs per day. She has never used smokeless tobacco. She reports that she does not drink alcohol and does not use drugs.    Review of Systems:  As per HPI.  Rest of the review of ten systems is negative or unremarkable except as stated above.    Physical Exam:  VITAL SIGNS:   Vitals:    08/19/22 1335   BP:    Pulse: 56   SpO2:           Wt Readings from Last 3 Encounters:   08/19/22 96.7 kg (213 lb 3.2 oz)   07/22/22 94.3 kg (208 lb)   07/15/22 94.3 kg (208 lb)      Today's Body mass index is 34.41 kg/m??.   Height: 167.6 cm (5' 6)  CONSTITUTIONAL: well-appearing in no acute distress  EYES: Conjunctivae and sclerae clear and anicteric.  ENT: Benign.   CARDIOVASCULAR: JVP not seen above the clavicle with HOB at 90 degrees. Rate and rhythm are regular.  There is no lifts or heaves.  Normal S1, S2. There is no murmur, gallops or rubs.  Radial and pedal pulses are 2+, bilaterally.   There is no edema to ankles, bilaterally.   RESPIRATORY: Decreased breath sounds at bases.  There are no wheezes.  GASTROINTESTINAL: Soft, non-tender, with audible bowel sounds. Abdomen nondistended.  Liver is nonpalpable.  SKIN: No rashes, ecchymosis or petechiae.  Warm, well perfused.   MUSCULOSKELETAL:  no joint swelling   NEURO/PSYCH: Appropriate mood and affect. Alert and oriented to person, place, and time. No gross motor or sensory deficits evident.    Pertinent Laboratory Studies:   Office Visit on 07/15/2022   Component Date Value Ref Range Status    BNP 07/15/2022 185.22 (H)  <=100 pg/mL Final    Sodium 07/15/2022 138  135 - 145 mmol/L Final    Potassium 07/15/2022 4.4  3.4 - 4.8 mmol/L Final    Chloride 07/15/2022 103  98 - 107 mmol/L Final    CO2 07/15/2022 26.4  20.0 - 31.0 mmol/L Final    Anion Gap 07/15/2022 9  5 - 14 mmol/L Final    BUN 07/15/2022 71 (H)  9 - 23 mg/dL Final    Creatinine 16/09/9603 2.57 (H)  0.60 - 0.80 mg/dL Final    BUN/Creatinine Ratio 07/15/2022 28   Final    eGFR [name]-EPI (2021) Female 07/15/2022 18 (L)  >=60 mL/min/1.19m2 Final Glucose 07/15/2022 151  70 - 179 mg/dL Final    Calcium 54/08/8118 9.7  8.7 - 10.4 mg/dL Final    Magnesium 14/78/2956 2.5  1.6 - 2.6 mg/dL Final   Office Visit on 06/22/2022   Component Date Value Ref Range Status    BNP 06/22/2022 206.59 (H)  <=100 pg/mL Final    Magnesium 06/22/2022 2.6  1.6 - 2.6 mg/dL Final    Sodium 16/09/9603 140  135 - 145 mmol/L Final    Potassium 06/22/2022 4.6  3.4 - 4.8 mmol/L Final    Chloride 06/22/2022 103  98 - 107 mmol/L Final    CO2 06/22/2022 29.6  20.0 - 31.0 mmol/L Final    Anion Gap 06/22/2022 7  5 - 14 mmol/L Final    BUN 06/22/2022 83 (H)  9 - 23 mg/dL Final    Creatinine 54/08/8118 2.83 (H)  0.60 - 0.80 mg/dL Final    BUN/Creatinine Ratio 06/22/2022 29   Final    eGFR [name]-EPI (2021) Female 06/22/2022 16 (L)  >=60 mL/min/1.64m2 Final    Glucose 06/22/2022 169  70 - 179 mg/dL Final    Calcium 14/78/2956 9.5  8.7 - 10.4 mg/dL Final   Office Visit on 06/11/2022   Component Date Value Ref Range Status    Sodium 06/11/2022 135  135 - 145 mmol/L Final    Potassium 06/11/2022 4.6  3.4 - 4.8 mmol/L Final    Chloride 06/11/2022 98  98 - 107 mmol/L Final    CO2 06/11/2022 30.4  20.0 - 31.0 mmol/L Final    Anion Gap 06/11/2022 7  5 - 14 mmol/L Final    BUN 06/11/2022 66 (H)  9 - 23 mg/dL Final    Creatinine 21/30/8657 3.20 (H)  0.60 - 0.80 mg/dL Final    BUN/Creatinine Ratio 06/11/2022 21   Final    eGFR [name]-EPI (2021) Female 06/11/2022 14 (L)  >=60 mL/min/1.31m2 Final    Glucose 06/11/2022 151  70 - 179 mg/dL Final    Calcium 84/69/6295 8.7  8.7 - 10.4 mg/dL Final    BNP 28/41/3244 292.58 (H)  <=100 pg/mL Final    Magnesium 06/11/2022 3.6 (H)  1.6 - 2.6 mg/dL Final   Office Visit on 06/09/2022   Component Date Value Ref Range Status    Sodium 06/09/2022 136  135 - 145 mmol/L Final    Potassium 06/09/2022 4.6  3.4 - 4.8 mmol/L Final    Chloride 06/09/2022 97 (L)  98 - 107 mmol/L Final    CO2 06/09/2022 33.0 (H)  20.0 - 31.0 mmol/L Final    Anion Gap 06/09/2022 6  5 - 14 mmol/L Final BUN 06/09/2022 72 (H)  9 - 23 mg/dL Final    Creatinine 12/30/7251 3.60 (H)  0.60 - 0.80 mg/dL Final    BUN/Creatinine Ratio 06/09/2022 20   Final    eGFR [name]-EPI (2021) Female 06/09/2022 12 (L)  >=60 mL/min/1.27m2 Final    Glucose 06/09/2022 160  70 - 179 mg/dL Final    Calcium 66/44/0347 8.6 (L)  8.7 - 10.4 mg/dL Final    Albumin 42/59/5638 4.0  3.4 - 5.0 g/dL Final    Total Protein 06/09/2022 7.6  5.7 - 8.2 g/dL Final    Total Bilirubin 06/09/2022 0.5  0.3 - 1.2 mg/dL Final    AST 75/64/3329 15  <=34 U/L Final    ALT 06/09/2022 8 (L)  10 - 49 U/L Final    Alkaline Phosphatase 06/09/2022 93  46 - 116 U/L Final    BNP 06/09/2022 556.31 (H)  <=100 pg/mL Final    Magnesium 06/09/2022 4.2 (H)  1.6 - 2.6 mg/dL Final   Office Visit on 06/05/2022   Component Date Value Ref Range Status    Sodium 06/05/2022 137  135 - 145 mmol/L Final    Potassium 06/05/2022 4.7  3.4 - 4.8 mmol/L Final  Chloride 06/05/2022 101  98 - 107 mmol/L Final    CO2 06/05/2022 29.5  20.0 - 31.0 mmol/L Final    Anion Gap 06/05/2022 7  5 - 14 mmol/L Final    BUN 06/05/2022 58 (H)  9 - 23 mg/dL Final    Creatinine 16/09/9603 3.23 (H)  0.60 - 0.80 mg/dL Final    BUN/Creatinine Ratio 06/05/2022 18   Final    eGFR [name]-EPI (2021) Female 06/05/2022 14 (L)  >=60 mL/min/1.15m2 Final    Glucose 06/05/2022 155  70 - 179 mg/dL Final    Calcium 54/08/8118 8.7  8.7 - 10.4 mg/dL Final    BNP 14/78/2956 451.18 (H)  <=100 pg/mL Final    Magnesium 06/05/2022 3.9 (H)  1.6 - 2.6 mg/dL Final   Admission on 21/30/8657, Discharged on 05/22/2022   Component Date Value Ref Range Status    Sodium 05/21/2022 139  135 - 145 mmol/L Final    Potassium 05/21/2022 5.0 (H)  3.4 - 4.8 mmol/L Final    Chloride 05/21/2022 106  98 - 107 mmol/L Final    CO2 05/21/2022 25.7  20.0 - 31.0 mmol/L Final    Anion Gap 05/21/2022 7  5 - 14 mmol/L Final    BUN 05/21/2022 52 (H)  9 - 23 mg/dL Final    Creatinine 84/69/6295 2.84 (H)  0.60 - 0.80 mg/dL Final    BUN/Creatinine Ratio 05/21/2022 18 Final    eGFR [name]-EPI (2021) Female 05/21/2022 16 (L)  >=60 mL/min/1.36m2 Final    Glucose 05/21/2022 145  70 - 179 mg/dL Final    Calcium 28/41/3244 8.9  8.7 - 10.4 mg/dL Final    Albumin 12/30/7251 4.0  3.4 - 5.0 g/dL Final    Total Protein 05/21/2022 7.6  5.7 - 8.2 g/dL Final    Total Bilirubin 05/21/2022 0.3  0.3 - 1.2 mg/dL Final    AST 66/44/0347 17  <=34 U/L Final    ALT 05/21/2022 <7 (L)  10 - 49 U/L Final    Alkaline Phosphatase 05/21/2022 83  46 - 116 U/L Final    WBC 05/21/2022 8.0  3.6 - 11.2 10*9/L Final    RBC 05/21/2022 4.52  3.95 - 5.13 10*12/L Final    HGB 05/21/2022 11.7  11.3 - 14.9 g/dL Final    HCT 42/59/5638 37.8  34.0 - 44.0 % Final    MCV 05/21/2022 83.6  77.6 - 95.7 fL Final    MCH 05/21/2022 25.9  25.9 - 32.4 pg Final    MCHC 05/21/2022 31.0 (L)  32.0 - 36.0 g/dL Final    RDW 75/64/3329 22.5 (H)  12.2 - 15.2 % Final    MPV 05/21/2022 9.6  6.8 - 10.7 fL Final    Platelet 05/21/2022 179  150 - 450 10*9/L Final    Anisocytosis 05/21/2022 Marked (A)  Not Present Final    Color, UA 05/21/2022 Colorless   Final    Clarity, UA 05/21/2022 Clear   Final    Specific Gravity, UA 05/21/2022 1.009  1.003 - 1.030 Final    pH, UA 05/21/2022 6.5  5.0 - 9.0 Final    Leukocyte Esterase, UA 05/21/2022 Small (A)  Negative Final    Nitrite, UA 05/21/2022 Negative  Negative Final    Protein, UA 05/21/2022 Negative  Negative Final    Glucose, UA 05/21/2022 Negative  Negative Final    Ketones, UA 05/21/2022 Negative  Negative Final    Urobilinogen, UA 05/21/2022 <2.0 mg/dL  <5.1 mg/dL Final  Bilirubin, UA 05/21/2022 Negative  Negative Final    Blood, UA 05/21/2022 Negative  Negative Final    RBC, UA 05/21/2022 1  <=4 /HPF Final    WBC, UA 05/21/2022 10 (H)  0 - 5 /HPF Final    Squam Epithel, UA 05/21/2022 1  0 - 5 /HPF Final    Bacteria, UA 05/21/2022 Rare (A)  None Seen /HPF Final    Hyaline Casts, UA 05/21/2022 7 (H)  0 - 1 /LPF Final    Urine Culture, Comprehensive 05/21/2022 Mixed Urogenital Flora   Final Smear Review Comments 05/21/2022 See Comment (A)  Undefined Final    hsTroponin I 05/21/2022 46 (HH)  <=34 ng/L Final    EKG Ventricular Rate 05/22/2022 59  BPM Final    EKG Atrial Rate 05/22/2022 59  BPM Final    EKG P-R Interval 05/22/2022 218  ms Final    EKG QRS Duration 05/22/2022 96  ms Final    EKG Q-T Interval 05/22/2022 508  ms Final    EKG QTC Calculation 05/22/2022 502  ms Final    EKG Calculated P Axis 05/22/2022 61  degrees Final    EKG Calculated R Axis 05/22/2022 97  degrees Final    EKG Calculated T Axis 05/22/2022 49  degrees Final    QTC Fredericia 05/22/2022 505  ms Final    hsTroponin I 05/22/2022 53 (HH)  <=34 ng/L Final    delta hsTroponin I 05/22/2022 7  <=7 ng/L Final   Infusion on 05/13/2022   Component Date Value Ref Range Status    Sodium 05/13/2022 141  135 - 145 mmol/L Final    Potassium 05/13/2022    Final    Chloride 05/13/2022 101  98 - 107 mmol/L Final    CO2 05/13/2022 31.2 (H)  20.0 - 31.0 mmol/L Final    Anion Gap 05/13/2022 9  5 - 14 mmol/L Final    BUN 05/13/2022 66 (H)  9 - 23 mg/dL Final    Creatinine 60/09/9322 2.55 (H)  0.60 - 0.80 mg/dL Final    BUN/Creatinine Ratio 05/13/2022 26   Final    eGFR [name]-EPI (2021) Female 05/13/2022 19 (L)  >=60 mL/min/1.31m2 Final    Glucose 05/13/2022 102  70 - 179 mg/dL Final    Calcium 55/73/2202 9.1  8.7 - 10.4 mg/dL Final    BNP 54/27/0623 200.15 (H)  <=100 pg/mL Final    Magnesium 05/13/2022 3.3 (H)  1.6 - 2.6 mg/dL Final    WBC 76/28/3151 6.6  3.6 - 11.2 10*9/L Final    RBC 05/13/2022 4.72  3.95 - 5.13 10*12/L Final    HGB 05/13/2022 11.7  11.3 - 14.9 g/dL Final    HCT 76/16/0737 38.7  34.0 - 44.0 % Final    MCV 05/13/2022 82.1  77.6 - 95.7 fL Final    MCH 05/13/2022 24.9 (L)  25.9 - 32.4 pg Final    MCHC 05/13/2022 30.3 (L)  32.0 - 36.0 g/dL Final    RDW 10/62/6948 21.4 (H)  12.2 - 15.2 % Final    MPV 05/13/2022 9.2  6.8 - 10.7 fL Final    Platelet 05/13/2022 178  150 - 450 10*9/L Final   Office Visit on 05/13/2022   Component Date Value Ref Range Status    Color, UA 05/13/2022 Yellow   Final    Clarity, UA 05/13/2022 Hazy   Final    Specific Gravity, UA 05/13/2022 1.015  1.005 - 1.030 Final    pH, UA  05/13/2022 5.5  5.0 - 9.0 Final    Leukocyte Esterase, UA 05/13/2022 Small (A)  Negative Final    Nitrite, UA 05/13/2022 Negative  Negative Final    Protein, UA 05/13/2022 Negative  Negative Final    Glucose, UA 05/13/2022 Negative  Negative Final    Ketones, UA 05/13/2022 Negative  Negative Final    Urobilinogen, UA 05/13/2022 0.2 mg/dL  0.2 - 2.0 mg/dL Final    Bilirubin, UA 05/13/2022 Negative  Negative Final    Blood, UA 05/13/2022 Small (A)  Negative Final    RBC, UA 05/13/2022 21 (H)  0 - 3 /HPF Final    WBC, UA 05/13/2022 40 (H)  0 - 3 /HPF Final    Squam Epithel, UA 05/13/2022 11 (H)  0 - 5 /HPF Final    Bacteria, UA 05/13/2022 Many (A)  None Seen /HPF Final    WBC Clumps 05/13/2022 Few (A)  None Seen /HPF Final    Urine Culture, Comprehensive 05/13/2022 >100,000 CFU/mL Klebsiella species (A)   Final    Urine Culture, Comprehensive 05/13/2022 >100,000 CFU/mL Streptococcus bovis group (A)   Final    TSH 05/13/2022 2.140  0.550 - 4.780 uIU/mL Final    Albumin 05/13/2022 4.0  3.4 - 5.0 g/dL Final    Total Protein 05/13/2022 7.8  5.7 - 8.2 g/dL Final    Total Bilirubin 05/13/2022 0.4  0.3 - 1.2 mg/dL Final    Bilirubin, Direct 05/13/2022 0.10  0.00 - 0.30 mg/dL Final    AST 16/09/9603    Final    ALT 05/13/2022 9 (L)  10 - 49 U/L Final    Alkaline Phosphatase 05/13/2022 79  46 - 116 U/L Final   Office Visit on 04/23/2022   Component Date Value Ref Range Status    Iron 04/23/2022 23 (L)  50 - 170 ug/dL Final    TIBC 54/08/8118 329  250 - 425 ug/dL Final    Iron Saturation (%) 04/23/2022 7 (L)  20 - 55 % Final    Ferritin 04/23/2022 16.4  7.3 - 270.7 ng/mL Final    WBC 04/23/2022 8.4  3.6 - 11.2 10*9/L Final    RBC 04/23/2022 4.47  3.95 - 5.13 10*12/L Final    HGB 04/23/2022 10.8 (L)  11.3 - 14.9 g/dL Final    HCT 14/78/2956 35.9  34.0 - 44.0 % Final    MCV 04/23/2022 80.3  77.6 - 95.7 fL Final    MCH 04/23/2022 24.1 (L)  25.9 - 32.4 pg Final    MCHC 04/23/2022 30.0 (L)  32.0 - 36.0 g/dL Final    RDW 21/30/8657 21.9 (H)  12.2 - 15.2 % Final    MPV 04/23/2022 8.0  6.8 - 10.7 fL Final    Platelet 04/23/2022 225  150 - 450 10*9/L Final    nRBC 04/23/2022 0  <=4 /100 WBCs Final    Neutrophils % 04/23/2022 73.5  % Final    Lymphocytes % 04/23/2022 12.0  % Final    Monocytes % 04/23/2022 9.4  % Final    Eosinophils % 04/23/2022 3.8  % Final    Basophils % 04/23/2022 1.3  % Final    Absolute Neutrophils 04/23/2022 6.2  1.8 - 7.8 10*9/L Final    Absolute Lymphocytes 04/23/2022 1.0 (L)  1.1 - 3.6 10*9/L Final    Absolute Monocytes 04/23/2022 0.8  0.3 - 0.8 10*9/L Final    Absolute Eosinophils 04/23/2022 0.3  0.0 - 0.5 10*9/L Final    Absolute  Basophils 04/23/2022 0.1  0.0 - 0.1 10*9/L Final    Anisocytosis 04/23/2022 Marked (A)  Not Present Final    Creat U 04/23/2022 55.2  Undefined mg/dL Final    Albumin Quantitative, Urine 04/23/2022 <0.3  Undefined mg/dL Final    Albumin/Creatinine Ratio 04/23/2022    Final    Smear Review Comments 04/23/2022 See Comment (A)  Undefined Final    Ovalocytes 04/23/2022 Moderate (A)  Not Present Final    Burr Cells 04/23/2022 Present (A)  Not Present Final    Poikilocytosis 04/23/2022 Moderate (A)  Not Present Final       Lab Results   Component Value Date    PRO-BNP 3,420.0 (H) 07/03/2021    PRO-BNP 905.0 (H) 03/21/2021    PRO-BNP 3,796.0 (H) 12/23/2020    Creatinine 2.57 (H) 07/15/2022    Creatinine 2.83 (H) 06/22/2022    Creatinine 0.79 01/06/2011    BUN 71 (H) 07/15/2022    BUN 83 (H) 06/22/2022    BUN 20 01/06/2011    Sodium 138 07/15/2022    Sodium 140 01/06/2011    Potassium 4.4 07/15/2022    Potassium 4.1 01/06/2011    CO2 26.4 07/15/2022    CO2 30 01/06/2011    Magnesium 2.5 07/15/2022    Magnesium 2.0 01/06/2011    Total Bilirubin 0.5 06/09/2022    INR 2.15 07/01/2021    INR 1.0 01/06/2011       Lab Results   Component Value Date    Digoxin Level 0.9 10/01/2020       Lab Results   Component Value Date    TSH 2.140 05/13/2022    Cholesterol 95 12/06/2021    Triglycerides 66 12/06/2021    HDL 42 12/06/2021    Non-HDL Cholesterol 53 (L) 12/06/2021    LDL Calculated 40 12/06/2021       Lab Results   Component Value Date    WBC 8.0 05/21/2022    WBC 12.5 (H) 01/06/2011    HGB 11.7 05/21/2022    HGB 12.9 01/06/2011    HCT 37.8 05/21/2022    HCT 40.8 01/06/2011    Platelet 179 05/21/2022    Platelet 260 01/06/2011       Pertinent Test Results from Today:  None    Other pertinent records were reviewed.    The following are further history from the patient's EPIC record for reference:     Past Medical History:   Diagnosis Date    Acute kidney injury superimposed on chronic kidney disease (CMS-HCC) 10/11/2020    Acute on chronic diastolic (congestive) heart failure (CMS-HCC) 08/23/2020    AKI (acute kidney injury) (CMS-HCC) 04/14/2015    Lab Results  Component  Value  Date     CREATININE  1.90 (H)  06/12/2021     Had a bump in her creatinine when she was taking her diuretics every day.  She is currently taking 40 mg daily of torsemide and 50 mg of spironolactone.  Her volume status is fragile.  Previously when she stopped her diuretic she becomes short of breath.  Plan: We will check her BMP today.  We will likely have to go to 40    Arthritis     Calculus of kidney     Calculus of ureter     CHF (congestive heart failure) (CMS-HCC)     Chronic atrial fibrillation (CMS-HCC) 07/20/2019    COPD (chronic obstructive pulmonary disease) (CMS-HCC)     Diabetes (CMS-HCC)  Gangrenous cholecystitis 10/11/2020    Generalized edema  06/17/2021    GERD (gastroesophageal reflux disease)     Hydronephrosis     Hypertension     Hyponatremia 10/11/2020    Intermediate coronary syndrome (CMS-HCC) 03/13/2014    Lower extremity edema 09/28/2020    Lumbar stenosis     Microscopic hematuria     Nausea alone     Nephrolithiasis 04/17/2016    Neuropathy Nocturia     Nocturia 07/01/2017    Other chronic cystitis     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  See plans for anemia, AKI  We are also tapering her gabapentin.  Currently on 300 mg 3 times daily.  We will decrease it to twice daily, and then nightly.  She states she is not having any recurrence of her pain.    Pulmonary hypertension (CMS-HCC)     Renal colic     Shortness of breath 04/28/2020    Sleep apnea     Unstable angina pectoris (CMS-HCC) 03/13/2014       Past Surgical History:   Procedure Laterality Date    BACK SURGERY  1995    CARPAL TUNNEL RELEASE Left 2014    HYSTERECTOMY  1971    IR INSERT CHOLECYSTOSMY TUBE PERCUTANEOUS  10/02/2020    IR INSERT CHOLECYSTOSMY TUBE PERCUTANEOUS 10/02/2020 Braulio Conte, MD IMG VIR [facility]    LUMBAR DISC SURGERY      PR REMOVAL GALLBLADDER N/A 10/06/2020    Procedure: CHOLECYSTECTOMY;  Surgeon: Katherina Mires, MD;  Location: MAIN OR [facility];  Service: Trauma    PR RIGHT HEART CATH O2 SATURATION & CARDIAC OUTPUT N/A 09/30/2020    Procedure: Right Heart Catheterization;  Surgeon: Neal Dy, MD;  Location: [facility] CATH;  Service: Cardiology    PR RIGHT HEART CATH O2 SATURATION & CARDIAC OUTPUT N/A 01/09/2021    Procedure: Right Heart Catheterization;  Surgeon: Lesle Reek, MD;  Location: [facility] CATH;  Service: Cardiology TUBE PERCUTANEOUS 10/02/2020 Braulio Conte, MD IMG VIR H&V [facility]    LUMBAR DISC SURGERY      PR REMOVAL GALLBLADDER N/A 10/06/2020    Procedure: CHOLECYSTECTOMY;  Surgeon: Katherina Mires, MD;  Location: MAIN OR [facility];  Service: Trauma    PR RIGHT HEART CATH O2 SATURATION & CARDIAC OUTPUT N/A 09/30/2020    Procedure: Right Heart Catheterization;  Surgeon: Neal Dy, MD;  Location: [facility] CATH;  Service: Cardiology    PR RIGHT HEART CATH O2 SATURATION & CARDIAC OUTPUT N/A 01/09/2021    Procedure: Right Heart Catheterization;  Surgeon: Lesle Reek, MD;  Location: [facility] CATH;  Service: Cardiology

## 2022-08-27 DIAGNOSIS — J449 Chronic obstructive pulmonary disease, unspecified: Principal | ICD-10-CM

## 2022-08-27 NOTE — Unmapped (Signed)
New orders for O2 sent to [facility] along with last clinic note. Both were faxed to [phone].

## 2022-09-02 NOTE — Unmapped (Signed)
[facility] Refill Coordination Note    Specialty Medication(s) to be Shipped:   General Specialty: Vyndamax    Other medication(s) to be shipped: No additional medications requested for fill at this time     Barbara [facility], DOB: 12-18-1941  Phone: [phone] (home)       All above HIPAA information was verified with patient.     Was a Nurse, learning disability used for this call? No    Completed refill call assessment today to schedule patient's medication shipment from the [facility] [phone]).  All relevant notes have been reviewed.     Specialty medication(s) and dose(s) confirmed: Regimen is correct and unchanged.   Changes to medications: Kalinda reports no changes at this time.  Changes to insurance: No  New side effects reported not previously addressed with a pharmacist or physician: None reported  Questions for the pharmacist: No    Confirmed patient received a Conservation officer, historic buildings and a Surveyor, mining with first shipment. The patient will receive a drug information handout for each medication shipped and additional FDA Medication Guides as required.       DISEASE/MEDICATION-SPECIFIC INFORMATION        N/A    SPECIALTY MEDICATION ADHERENCE     Medication Adherence    Patient reported X missed doses in the last month: 0  Specialty Medication: Vyndamax 61mg   Informant: patient                            Were doses missed due to medication being on hold? No    Vyndamax 61 mg: 18 days of medicine on hand       REFERRAL TO PHARMACIST     Referral to the pharmacist: Not needed      [facility]     Shipping address confirmed in Epic.     Delivery Scheduled: Yes, Expected medication delivery date: 09/15/22.     Medication will be delivered via Next Day Courier to the prescription address in Epic WAM.    Camillo Flaming   [facility] Specialty Pharmacist

## 2022-09-02 NOTE — Unmapped (Signed)
Message was received from [place] at [facility] stating they did not receive the orders and note that was faxed last Thursday. I faxed them both again to [phone].

## 2022-09-03 MED ORDER — ESTRADIOL 0.01% (0.1 MG/GRAM) VAGINAL CREAM
VAGINAL | 3 refills | 74 days | Status: CP
Start: 2022-09-03 — End: ?

## 2022-09-14 MED FILL — VYNDAMAX 61 MG CAPSULE: ORAL | 30 days supply | Qty: 30 | Fill #8

## 2022-09-17 DIAGNOSIS — J449 Chronic obstructive pulmonary disease, unspecified: Principal | ICD-10-CM

## 2022-09-17 NOTE — Unmapped (Signed)
Faxed order for POC to deliver O2 at 4 LPM to [facility]. Fax # [phone]

## 2022-09-26 DIAGNOSIS — J302 Other seasonal allergic rhinitis: Principal | ICD-10-CM

## 2022-09-26 MED ORDER — MONTELUKAST 10 MG TABLET
ORAL_TABLET | 2 refills | 0 days
Start: 2022-09-26 — End: ?

## 2022-09-29 MED ORDER — BLOOD-GLUCOSE METER KIT WRAPPER
0 refills | 0.00000 days | Status: CP
Start: 2022-09-29 — End: 2023-09-29

## 2022-09-29 MED ORDER — ACCU-CHEK GUIDE TEST STRIPS
ORAL_STRIP | SUBCUTANEOUS | 3 refills | 0.00000 days | Status: CP
Start: 2022-09-29 — End: 2022-09-29

## 2022-09-29 MED ORDER — MONTELUKAST 10 MG TABLET
ORAL_TABLET | 2 refills | 0 days | Status: CP
Start: 2022-09-29 — End: ?

## 2022-10-05 DIAGNOSIS — Z09 Encounter for follow-up examination after completed treatment for conditions other than malignant neoplasm: Principal | ICD-10-CM

## 2022-10-05 MED ORDER — ROSUVASTATIN 5 MG TABLET
ORAL_TABLET | ORAL | 11 refills | 0 days
Start: 2022-10-05 — End: ?

## 2022-10-06 DIAGNOSIS — N1832 Type 2 diabetes mellitus with stage 3b chronic kidney disease, without long-term current use of insulin (CMS-HCC): Principal | ICD-10-CM

## 2022-10-06 DIAGNOSIS — E1122 Type 2 diabetes mellitus with diabetic chronic kidney disease: Principal | ICD-10-CM

## 2022-10-06 DIAGNOSIS — Z09 Encounter for follow-up examination after completed treatment for conditions other than malignant neoplasm: Principal | ICD-10-CM

## 2022-10-06 MED ORDER — ROSUVASTATIN 5 MG TABLET
ORAL_TABLET | ORAL | 11 refills | 0.00000 days | Status: CP
Start: 2022-10-06 — End: 2023-10-06

## 2022-10-06 MED ORDER — ACCU-CHEK GUIDE TEST STRIPS
ORAL_STRIP | 3 refills | 0 days | Status: CP
Start: 2022-10-06 — End: ?

## 2022-10-06 MED ORDER — BLOOD-GLUCOSE METER KIT WRAPPER
0 refills | 0 days | Status: CP
Start: 2022-10-06 — End: 2023-10-06

## 2022-10-06 NOTE — Unmapped (Signed)
The original prescription was reordered on 10/06/2022 by Joesphine Bare, RN. Renewing this prescription may not be appropriate.

## 2022-10-06 NOTE — Unmapped (Signed)
Patient states pharmacy did not get the 10/02/22 rx for glucometer and test strips.  Transcribed and resubmitted.

## 2022-10-06 NOTE — Unmapped (Signed)
[facility] Refill Coordination Note    Specialty Medication(s) to be Shipped:   General Specialty: Vyndamax    Other medication(s) to be shipped: No additional medications requested for fill at this time     Barbara Huber, DOB: 07-01-1941  Phone: [phone] (home)       All above HIPAA information was verified with patient.     Was a Nurse, learning disability used for this call? No    Completed refill call assessment today to schedule patient's medication shipment from the [facility] [phone]).  All relevant notes have been reviewed.     Specialty medication(s) and dose(s) confirmed: Regimen is correct and unchanged.   Changes to medications: Barbara Huber reports no changes at this time.  Changes to insurance: No  New side effects reported not previously addressed with a pharmacist or physician: None reported  Questions for the pharmacist: No    Confirmed patient received a Conservation officer, historic buildings and a Surveyor, mining with first shipment. The patient will receive a drug information handout for each medication shipped and additional FDA Medication Guides as required.       DISEASE/MEDICATION-SPECIFIC INFORMATION        N/A    SPECIALTY MEDICATION ADHERENCE     Medication Adherence    Patient reported X missed doses in the last month: 0  Specialty Medication: Vyndamax 61mg   Informant: patient                                Were doses missed due to medication being on hold? No    Vyndamax 61 mg: ~15 days of medicine on hand       REFERRAL TO PHARMACIST     Referral to the pharmacist: Not needed      [facility]     Shipping address confirmed in Epic.     Delivery Scheduled: Yes, Expected medication delivery date: 10/15/22.     Medication will be delivered via Next Day Courier to the prescription address in Epic WAM.    Camillo Flaming, PharmD   [facility] Specialty Pharmacist

## 2022-10-12 DIAGNOSIS — E119 Type 2 diabetes mellitus without complications: Principal | ICD-10-CM

## 2022-10-12 MED ORDER — LANCETS
Freq: Every day | 6 refills | 0 days | Status: CP
Start: 2022-10-12 — End: ?

## 2022-10-12 NOTE — Unmapped (Signed)
Refill of lancets. 

## 2022-10-14 MED FILL — VYNDAMAX 61 MG CAPSULE: ORAL | 30 days supply | Qty: 30 | Fill #9

## 2022-10-15 ENCOUNTER — Ambulatory Visit: Admit: 2022-10-15 | Discharge: 2022-10-16 | Payer: MEDICARE

## 2022-10-15 DIAGNOSIS — I50812 Chronic right heart failure: Principal | ICD-10-CM

## 2022-10-15 DIAGNOSIS — I272 Pulmonary hypertension, unspecified: Principal | ICD-10-CM

## 2022-10-15 DIAGNOSIS — J9611 Chronic respiratory failure with hypoxia: Principal | ICD-10-CM

## 2022-10-15 DIAGNOSIS — J449 Chronic obstructive pulmonary disease, unspecified: Principal | ICD-10-CM

## 2022-10-15 NOTE — Unmapped (Signed)
I faxed the O2 order (POC placed on 9/21) and the progress note from today to [facility] at [phone].

## 2022-10-15 NOTE — Unmapped (Signed)
[facility]  Pulmonary Hypertension Program                        H. Sherre Lain, MD--Director (Pulmonary)  Denice Bors. [name], MD--Associate Director (Pulmonary)  Freeman Caldron, MD (Cardiology)  Bonney Leitz, MD (Pulmonary)    [name], RN Nurse Coordinator  Claretha Cooper, RN Nurse Coordinator      [phone] office  [phone] fax            [name]  is a 81 y.o. female patient referred for work up and evaluation for pulmonary hypertension, COPD, and chronic hypoxemic respiratory failure.    Assessment:      Barbara Huber is a 81 y.o.female with COPD and chronic hypoxemic respiratory failure. She was previously referred to me for pulmonary hypertension, though this is now clearly dominant left heart/group 2 pathology (with small precapillary component, either secondary to [name] versus COPD).     Overall doing well since last visit, and from a COPD standpoint she has been very well controlled lately, with no exacerbations. Continues to experience daily fatigue and dyspnea, though stable, and more likely related to cardiac pathology than truly COPD. Overall she has benefited from current inhaler plan (non-DPI focused), as well as daytime oxygen/nocturnal NIPPV, and heart failure care. With lack of exacerbations recently, we trialed change to LABA/LAMA with Stiolto, but did not tolerate this, changed back to Spiriva/Advair.       Plan:         - Continue Advair HFA (mid dose steroid, okay if this is used only once daily as long as feeling well), and Spiriva Respimat. Did not do well with previous trial of LABA/LAMA only (side effects with Stiolto), so will leave unchanged for now.   - Continuous oxygen unchanged, at 3LPM, see below for documentation of low SpO2 on room air today, will send information to [employer].  - Continue Trilogy plus oxygen (4LPM) at night unchanged. She is benefiting from AVAPS settings.   - Discussed interest in formal PR at any point in the future, can continue to discuss in future if patient interested.  - Close followup with heart failure team (seeing Barbara Huber today).  - Advised RSV vaccine and COVID booster, patient will think more about these.        Followup in 6 months.       Barbara Byars, MD  10/15/22  Subjective:        81 y.o. 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 Meredeth Ide. Recalls COPD diagnosis dating back more than 10 years ago, no history of asthma or other lung conditions. Overall it sounds like she has been maintained on triple inhaler therapy (Advair diskus, Spiriva handihaler), with exacerbations averaging  maybe once/year, usually not requiring hospitalization. Albuterol PRN via MDI (tries to use BID by routine) or neb (rare use). In 06/2019 she was admitted to [facility] with left sided pneumonia, also treated for COPD exacerbation with steroids during this, course also complicated by AKI and Afib/RVR. Hypercapnic during admission and started on Trilogy for discharge. She also carries diagnosis of CHF, made a few years ago, at least 2 admissions (usually [place]) for diuresis. With recent admission did have very elevated proBNPs and discharged on higher dose of Lasix (40 mg daily). Denies recent edema on this higher dose of diuretic. No orthopnea. She has not been doing a lot of physical activity during COVID, will notice very mild dyspnea with self care, no exertional CP or LH. Does find neuropathy can hinder her activity as well. Occasional cough when not having exacerbation, clear sputum. Did have hemoptysis with pneumonia, none outside of this. With first visit we discussed recommendation to repeat CXR, and also repeat TTE in stable outpatient state.    Subsequent history: At visit 10/2019 doing okay, occasional episodes of DOE but overall happy with level of function. Discussed potential for clinical trial enrollment, but she was feeling very well and did not want to do invasive testing unless changing. Admitted to [facility] 04/2020 for dyspnea and decompensated heart failure, diuresed and also given IV iron. At followup 06/2020 not quite back at baseline, but improving steadily. She was then admitted 07/2020 again for heart failure, and then again in 09/2020 for two weeks with decompensated heart failure, Afib/RVR, septic shock, and gangrenous cholecystitis. Shortly thereafter she was diagnosed with ATTR cardiac amyloid. Admitted 12/2020 with dyspnea and decompensated heart failure, diuresed and underwent DCCV. At followup that month slowing improving since discharge, though more hypoxemic than baseline, discussed anemia/iron and changed inhalers to non-DPI formulations. At followup 01/2021 feeling much better, more active, started tafamidis, no additional changes made. At followup 04/2021 doing well, spirometry improved, no changes made. At followup 10/2021 doing okay, no changes. At followup 05/2022 doing well, brief recent ED stay for low sats which was due to [place] malfunction, changed Advair/Spiriva to Stiolto to get rid of ICS, though this was ultimately not tolerated and went back to Advair/Spiriva. At followup 09/2022 visit early mainly for oxygen need documentation, doing very well, no other changes    Interval history 09/2022: Since last visit  Doing very well overall.  Did not tolerate Stiolto due to side effects (can't remember which now)  Back on Spiriva and Advair, often forgetting PM Advair  Flu vaccine received in last few weeks. Undecided about COVID and RSV.  Stable edema and weight, will take extra torsemide when needed for weight gain.  Breathing has been stable.  Daughter had COVID but she did not get this.  Trilogy nightly, now established with [employer] for O2, has POC.     Past medical, past surgical, family, and social histories reviewed and updated in Epic.    Review of systems is significant for:   A 12 point review of systems was negative except for pertinent items noted in the HPI.    Allergies   Allergen Reactions    Nitrofurantoin Anaphylaxis and Other (See Comments)    Lipitor [Atorvastatin] Muscle Pain     SOB, Headache, fatigue, sick on my stomach      Current Outpatient Medications   Medication Sig Dispense Refill    albuterol HFA 90 mcg/actuation inhaler Inhale 2 puffs every eight (8) hours as needed for wheezing.  amiodarone (PACERONE) 200 MG tablet Take 1 tablet (200 mg total) by mouth daily. 90 tablet 3    cranberry 500 mg cap Take 500 mg by mouth daily with evening meal.      fluticasone propion-salmeteroL (ADVAIR HFA) 115-21 mcg/actuation inhaler Inhale 2 puffs Two (2) times a day. 12 g 11    fosfomycin (MONUROL) 3 gram Pack Take 3 g by mouth once a week. 36 g 3    gabapentin (NEURONTIN) 300 MG capsule Take 1 capsule (300 mg total) by mouth in the morning. 90 capsule 3    insulin glargine (BASAGLAR, LANTUS) 100 unit/mL (3 mL) injection pen Inject 0.04 mL (4 Units total) under the skin nightly. 15 mL 3    montelukast (SINGULAIR) 10 mg tablet TAKE 1 TABLET(10 MG) BY MOUTH IN THE MORNING 90 tablet 2    OXYCONTIN 10 mg TR12 12 hr crush resistant ER/CR tablet Take 1 tablet (10 mg total) by mouth every twelve (12) hours.      pantoprazole (PROTONIX) 20 MG tablet Take 1 tablet (20 mg total) by mouth daily. 60 tablet 5    rosuvastatin (CRESTOR) 5 MG tablet Take 1 tablet (5 mg total) by mouth every other day. 15 tablet 11    spironolactone (ALDACTONE) 25 MG tablet Take 1 tablet (25 mg total) by mouth daily. 90 tablet 3    tiotropium bromide (SPIRIVA RESPIMAT) 2.5 mcg/actuation inhalation mist Inhale 2 puffs daily. 4 g 11    torsemide (DEMADEX) 20 MG tablet Take 2 tablets (40 mg total) by mouth daily. May also take 1 tablet (20 mg total) daily as needed (weight gain for 3lbs overnight or 5lbs in a week). 180 tablet 3    acetaminophen (TYLENOL) 500 MG tablet Take 2 tablets (1,000 mg total) by mouth daily as needed for pain.      blood sugar diagnostic (ACCU-CHEK GUIDE TEST STRIPS) Strp Use to check blood sugar daily as instructed by clinic. Dx E11.9 For daily testing 100 strip 3    blood-glucose meter kit Use as instructed Daily testing 1 each 0    estradioL (ESTRACE) 0.01 % (0.1 mg/gram) vaginal cream Insert 2 g into the vagina Two (2) times a week. 42.5 g 3    lancets Misc 1 each by Miscellaneous route daily. Accu Check 100 each 6    metOLazone (ZAROXOLYN) 5 MG tablet Take 1 tablet (5 mg total) by mouth daily as needed (when instructed by cardiology clinic). 30 tablet 1    NARCAN 4 mg/actuation nasal spray 1 spray into alternating nostrils once as needed (opioid overdose). PRN - Emergency use. (Patient not taking: Reported on 08/19/2022)      NON FORMULARY APPLY FROM NECK DOWN TWICE A DAY      OXYGEN-AIR DELIVERY SYSTEMS MISC 5 L by Miscellaneous route. Currently using 3  L/min via       pen needle, diabetic (BD ULTRA-FINE SHORT PEN NEEDLE) 31 gauge x 5/16 (8 mm) Ndle Give 4 units of insulin each night 100 each 1    sertraline (ZOLOFT) 25 MG tablet Take 1 tablet (25 mg total) by mouth daily. (Patient not taking: Reported on 10/15/2022) 90 tablet 3    tafamidis (VYNDAMAX) 61 mg cap Take 1 capsule (61 mg) by mouth daily. 30 capsule 11    varicella-zoster [id number], PF, (SHINGRIX, PF,) 50 mcg/0.5 mL SusR injection Inject 0.5 mL into the muscle. 0.5 mL 1     No current facility-administered medications for this visit.  Objective:   Objective     Physical Exam:   BP 111/54 (BP Site: L Arm, BP Position: Sitting, BP Cuff Size: Large)  - Pulse 61  - Temp 36.4 ??C (97.5 ??F) (Temporal)  - Wt 97.1 kg (214 lb) Comment: Stated - SpO2 91% Comment: Nasal 3L - BMI 34.54 kg/m??   Body mass index is 34.54 kg/m??.    SpO2 at rest 99% 3LPM  SpO2 at rest 88% RA    Wt Readings from Last 6 Encounters:   10/15/22 97.1 kg (214 lb)   08/19/22 96.7 kg (213 lb 3.2 oz)   07/22/22 94.3 kg (208 lb)   07/15/22 94.3 kg (208 lb)   06/22/22 96.8 kg (213 lb 6.4 oz)   06/11/22 97.8 kg (215 lb 8 oz)     General Appearance:    Alert, cooperative, no distress, appears stated age.   HEENT:    Normocephalic, without obvious abnormality, atraumatic. PERRL, conjunctiva clear. Nares normal, no drainage, no sinus tenderness. Oropharynx with no lesions. No facial telangectasias.   Neck:    Supple, trachea midline, no adenopathy.    Lungs:    Distant breath sounds b/l, no crackles or wheezes, respirations unlabored. No PA bruits.   Chest wall:   Expands symmetrically, no tenderness or deformity.   Cardiovascular:   Regular rate and rhythm, S1 and S2 normal, P2 not overly prominent, no murmur, rubs, or gallop appreciated. Neck exam with prominent carotid impulse, JVD not clearly visible when upright.   Abdomen:   Soft, non-tender, normal bowel sounds, no masses, no organomegaly   Extremities:     Extremities normal, no cyanosis, clubbing. Trace pitting edema, similar. No sclerodactyly.   Skin:   No rashes or lesions.   Neuro/psych:      No focal neurologic deficits; mood/affect normal.     Diagnostic Review:    Cardiac testing summary:  TTE 11/2021: EF >70%, gr 3DD, sev LVH       RV mild dil, nl fxn       No effusion. IVC dil/poor insp collapse  TTE 03/2021:   RHC 01/09/21: RA 23, PA 85/40 (55), PCW 32       CO/CI 5.3/2.5 (f,td), PA sat 57%, PVR 4.2 WU  TTE 09/2020: LA mild dil, EF >55%       RV ULN size, HK, tr TR, can't est PASP       No effusion. IVC dil, poor insp collapse  TTE 04/2020: LA 41, EF >55%, LVH (16)       RV normal, mild HK, TAPSE 25, can't est PASP       No effusion. IVC normal.  TTE 10/2019: LA 42, EF >55%       RV mild dil/HK, can't est PASP       No effusion. IVC dil, poor insp collapse  TTE 06/2019: LA 38, EF 65-70%, grade 2 DD, LVH (13-14)       RV mild dil, low nl fxn, TAPSE 17, mild-mod Tr, est PASP 64+CVP       Tr effusion. IVC nl size, poor insp collapse  TTE 04/2018 (cone): LA 40, EF 60-65%, grade 1 DD       RV normal, mild TR, est PASP within the normal range       No effusion. IVC normal    CXR 05/21/22: Similar bilat mid and lower lung opacities.        Small bilateral pleural effusions.        Stable  enlarged cardiac silhouette.   CXR 08/2020: cardiomegaly       Pulmonary vascular congestion with increased markings       Hyperinflated lungs, flat diagphragms  CXR 04/2020: cardiomegaly       Hazy interstitial opacities with vascular congestion       Likely trace L effusion.  CXR 08/2019:  stable cardiomegaly       Interval resolution of LLL consolidation.       Stable parenchymal scarring in the left midlung zone.       Interval decreased now trace left pleural effusion.    CTA chest 10/2018 (cone): Cardiovascular: There is no demonstrable pulmonary embolus. There is  no thoracic aortic aneurysm or dissection. There is calcification at  the origin of the left subclavian artery. Other visualized great  vessels appear unremarkable. There is aortic atherosclerosis. There  are foci of coronary artery calcification evident. There is a small  amount of pericardial fluid, within the physiologic range, stable.  Pericardium does not appear appreciably thickened.  Prominence of the main pulmonary outflow tract is noted with a  measured diameter of 3.7 cm.  Mediastinum/Nodes: There is a subcentimeter nodular opacity in the  left lobe of the thyroid, stable. There are multiple subcentimeter  mediastinal lymph nodes again noted. There are prominent right  paratracheal lymph nodes with largest lymph node in this area  measuring 1.8 x 1.4 cm, marginally smaller than on prior study.  Aortopulmonary window lymph nodes appear slightly smaller than on  previous study. No new lymph node prominence is evident on this  study. No esophageal lesions are evident.  Lungs/Pleura: There is atelectatic change in the lung base regions,  stable. There is no frank edema or consolidation. A mild degree of  centrilobular emphysematous change appears stable. No appreciable  pleural effusion.  Upper Abdomen: There is a cyst in the medial upper pole right kidney  measuring 1.5 x 1.5 cm. A cyst is noted in the lateral mid left  kidney measuring 1.5 x 1.3 cm. There is aortic atherosclerosis as  well as calcification in the proximal visualized great vessels.  Visualized upper abdominal structures otherwise appear unremarkable.  Musculoskeletal: There is stable anterior wedging of the L1  vertebral body. There is multilevel degenerative change in the  thoracic spine which appear stable. No blastic or lytic bone lesions  are evident. No evident chest wall lesions.     CT chest 04/2018 (cone): Cardiovascular: Mild cardiomegaly. Small pericardial  effusion/thickening. Left anterior descending and right coronary  atherosclerosis. Atherosclerotic nonaneurysmal thoracic aorta.  Dilated main pulmonary artery (3.6 cm diameter). No central  pulmonary emboli.  Mediastinum/Nodes: Subcentimeter hypodense posterior left thyroid  lobe nodule. Unremarkable esophagus. No axillary adenopathy.  Enlarged right paratracheal nodes up to 1.6 cm (series 2/image 54).  Enlarged 1.1 cm AP window node (series 2/image 51). No  pathologically enlarged hilar nodes.  Lungs/Pleura: No pneumothorax. Trace dependent left pleural  effusion. No right pleural effusion. No acute consolidative airspace  disease, lung masses or significant pulmonary nodules. Minimal  interlobular septal thickening throughout both lungs. Several mildly  thickened parenchymal bands in the dependent right middle lobe,  lingula and right lower lobe. No significant regions of  bronchiectasis. Mild centrilobular emphysema with mild diffuse  bronchial wall thickening.  Upper abdomen: Partially visualized simple 1.6 cm medial upper right  renal cyst. Mild scarring in the visualized upper right kidney.  Musculoskeletal: No aggressive appearing focal osseous lesions.  Stable mild chronic L1 vertebral compression fracture.  Moderate  thoracic spondylosis.     V/Q scan : none    08/2019: 152 meters. HR 79->101, O2 sat 91->88% on 3LPM, needing 4LPM. Max Borg 2.    Pulmonary Function Testing:  Date FEV1 FVC TLC FRC RV DLCO    04/2021  1.12 (66%)  1.91 (86%)        08/2019  0.99 (56%)  1.83 (79%)     5.0 (26%)    04/2018  0.98 (59%)  1.88 (89%)  68%   63%   (29%)                Overnight oximetry 01/2021 (NIPPV/5L): 48 min <89%, avg 90%, nadir 81%, ODI 15  Overnight oximetry 08/2019: 20 min <89% (total time 6.5 hours), avg 90%, nadir 94%       Compliance data with very good usage  PSG : done in past but not available     Relevant Serologies:   Labs 08/2019: ANA pos 1:160 homogenous, ENA neg, RF/CCP neg  Labs 12/2018: ANCA neg, ACE 31    Routine Labs:        Date NTproBNP Creatinine  Potassium  Bicarbonate  Hemoglobin  abs eos    07/2022 BNP 167 2.4 4.4  26 13.6     04/2022 BNP 200 2.6   31 11.7     07/2021 BNP 445 2.6   30  9.7  300    03/2021 BNP 567 1.5 4.1  31  8.2  200    12/2020 BNP 926->479 1.3 3.8  33  8.9     11/2020  3800   BNP 567              07/2020  BNP 603              05/2020  1970  1.3  4.3  32  11.2     10/2019  2220  1.1    34        08/2019  1410              07/2019  1270  0.9  4.1  29  10.0     06/2019  6600->13K             11/2018  BNP 253              08/2018  BNP 531

## 2022-10-15 NOTE — Unmapped (Signed)
Thank you for visiting the [facility] Pulmonary Hypertension Clinic.    If labs were taken today, we will contact you if any results need to be addressed before next visit. Please call or Mychart for any specific questions about results.    If you have any questions or concerns, please call     Haze Justin, MD  [name], RN, Pulmonary Hypertension Nurse Coordinator  Claretha Cooper, RN, Pulmonary Hypertension Nurse Coordinator    [phone].         No big changes today.  I will send in documentation for [employer].

## 2022-10-20 ENCOUNTER — Ambulatory Visit: Admit: 2022-10-20 | Discharge: 2022-10-21 | Payer: MEDICARE | Attending: Family | Primary: Family

## 2022-10-20 ENCOUNTER — Ambulatory Visit: Admit: 2022-10-20 | Discharge: 2022-10-21 | Payer: MEDICARE

## 2022-10-20 DIAGNOSIS — M25571 Pain in right ankle and joints of right foot: Principal | ICD-10-CM

## 2022-10-20 DIAGNOSIS — M25531 Pain in right wrist: Principal | ICD-10-CM

## 2022-10-20 NOTE — Unmapped (Signed)
Plan:You received a corticosteroid injection to reduce pain and inflammation.  Please note that it can take up to 2 weeks for this injection to fully work.  While many people will feel relief sooner, please be patient.      The injection contained a corticosteroid and a numbing agent.  The numbing agent can last for 1-6 hours.  After this wears off you may have increased pain until the steroid has a chance to work.    What are some of the possible side effects of a steroid injection?    Common side effects:  temporarily elevated blood sugar (in diabetic patients) that can last a few days   flushing of the skin, especially the face  temporary rise in blood pressure  discoloration or atrophy of the skin at the injection site    Call your doctor at once if you have:  persistent worsening pain or swelling, fever;  blurred vision, tunnel vision, eye pain, or seeing halos around lights;  fast or slow heartbeats;  increased blood pressure that is associated with severe headache, blurred vision, pounding in your neck or ears, anxiety, nosebleed;  headaches, ringing in your ears, dizziness, nausea, vision problems, pain behind your eyes    This is not a complete list of side effects and others may occur. Call your provider for medical advice about side effects.     What other drugs may be affected after the injection?  Many drugs can interact with steroids. Not all possible interactions are listed here. Tell your doctor about all your current medicines and any you start or stop using, especially:  an antibiotic or antifungal medication;  birth control pills or hormone replacement therapy;  a blood thinner (warfarin, Coumadin, and others);  a diuretic or water pill;  insulin or oral diabetes medicine;  medicine to treat tuberculosis;  a nonsteroidal anti-inflammatory drug or NSAID (aspirin, ibuprofen, naproxen, diclofenac, indomethacin, Advil, Aleve, Celebrex, and many others); or  seizure medication.      You can resume your normal daily activities, but consider resting the injected area for the next few days.          Thank you for coming to [facility] and our clinic today!     We aim to provide you with the highest quality, individualized care.  If you have any unanswered questions after the visit, please do not hesitate to reach out to us on MyChart or leave a message for the nurse.  ?  MyChart messages: These messages can be sent to your provider and will be checked by their clinical support staff.? The messages are checked throughout the day during normal business hours from 8:30 am-4:00 pm Monday-Friday, however responses may take up to 48 hours.? Please use this method of communication for non-urgent and non-emergent concerns, questions, refill requests or inquiries only.? ?Our team will help respond to all of your questions.? Please note that you may be asked to see a provider by either a telehealth or in person visit if it is deemed your questions are best handled in the clinic setting in person.??  ?  Please keep in mind, these messages are not real time communications, so be patient when waiting for a response.    If you do not have access to MyChart, do not know how to use MyChart or have an issue that may require more extensive discussion, please call the nurses' call line: [phone].? This line is checked throughout the day and will be   responded to as time allows.? Please note that return calls could take up to 48 hours, depending on the nature of the need.?  ?  If you have an issue that requires emergent attention that cannot wait; either call the Orthopaedics resident on call at [phone], consider coming to our [facility] walk-in clinic, or go to the nearest Emergency Department.    If you need to schedule future appointments, please call [phone].     We look forward to seeing you again in the future and appreciate you choosing [facility] for your care!    Thank you,                We provide innovative and comprehensive patient centered care that is supported by evidence-based research                                                                                                    RESEARCH PARTICIPATION    Please check out our current research studies to see if you or someone you know may qualify at:    [email or web address]

## 2022-10-20 NOTE — Unmapped (Signed)
SPORTS MEDICINE RETURN VISIT    ASSESSMENT AND PLAN      Diagnosis ICD-10-CM Associated Orders   1. Acute right ankle pain  M25.571 XR Ankle Arthritis Series Right      2. Pain in joint of right wrist  M25.531 XR Wrist 3 Or More Views Right     Med Joint Inj: R ulnocarpal           Pain is localized to the TFCC and I offered steroid injection.    Given her swelling and pain with known diabetic neuropathy, I would like her to follow-up with our foot and ankle specialty team for evaluation.    Return for [facility] for R ankle.    Procedure(s):  See note below      SUBJECTIVE     Chief Complaint:   Chief Complaint   Patient presents with    Right Wrist - Pain     Right wrist pain        History of Present Illness: 81 y.o. female who presents for right ulnar-sided wrist pain.  No injury.  She does use a walker for ambulation which is painful because of this more recent onset wrist pain.  She had prior to the contralateral wrist with good relief with injection.    Past Medical History:   Past Medical History:   Diagnosis Date    Acute kidney injury superimposed on chronic kidney disease (CMS-HCC) 10/11/2020    Acute on chronic diastolic (congestive) heart failure (CMS-HCC) 08/23/2020    AKI (acute kidney injury) (CMS-HCC) 04/14/2015    Lab Results  Component  Value  Date     CREATININE  1.90 (H)  06/12/2021     Had a bump in her creatinine when she was taking her diuretics every day.  She is currently taking 40 mg daily of torsemide and 50 mg of spironolactone.  Her volume status is fragile.  Previously when she stopped her diuretic she becomes short of breath.  Plan: We will check her BMP today.  We will likely have to go to 40    Arthritis     Calculus of kidney     Calculus of ureter     CHF (congestive heart failure) (CMS-HCC)     Chronic atrial fibrillation (CMS-HCC) 07/20/2019    COPD (chronic obstructive pulmonary disease) (CMS-HCC)     Diabetes (CMS-HCC)     Gangrenous cholecystitis 10/11/2020    Generalized edema  06/17/2021    GERD (gastroesophageal reflux disease)     Hydronephrosis     Hypertension     Hyponatremia 10/11/2020    Intermediate coronary syndrome (CMS-HCC) 03/13/2014    Lower extremity edema 09/28/2020    Lumbar stenosis     Microscopic hematuria     Nausea alone     Nephrolithiasis 04/17/2016    Neuropathy     Nocturia     Nocturia 07/01/2017    Other chronic cystitis     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  See plans for anemia, AKI  We are also tapering her gabapentin.  Currently on 300 mg 3 times daily.  We will decrease it to twice daily, and then nightly.  She states she is not having any recurrence of her pain.    Pulmonary hypertension (CMS-HCC)     Renal colic     Shortness of breath 04/28/2020    Sleep apnea     Unstable angina pectoris (CMS-HCC) 03/13/2014  OBJECTIVE     Physical Exam:  Vitals:   Wt Readings from Last 3 Encounters:   10/22/22 97.5 kg (215 lb)   10/22/22 97.5 kg (215 lb)   10/15/22 97.1 kg (214 lb)     Estimated body mass index is 34.7 kg/m?? as calculated from the following:    Height as of 10/22/22: 167.6 cm (5' 6).    Weight as of 10/22/22: 97.5 kg (215 lb).  Gen: Well-appearing female in no acute distress  MSK: Right wrist with mild enlargement at the ulnar styloid with tenderness palpation of the TFCC.  No significant instability with toggle test at DRUJ.  Wrist range of motion limited secondary to pain.    Right ankle with mild diffuse edema.  Mild erythema without any excessive warmth.  No tenderness to palpation diffusely.  Pedal pulse +1.  Capillary refills less than 3 seconds.    Imaging/other tests: Right wrist x-rays taken today and reviewed with patient exam showed no fracture, dislocation or acute osseous abnormality.    Ankle arthritis series reviewed with patient and daughter in exam room today and show:  No acute fracture. Pes planus. Mild dorsal midfoot spurring with midfoot sag. Ankle joints are normal. Soft tissue swelling of the lateral ankle.Limited views of the left foot are unremarkable.    @SPORTSPROMIS @      ADMINISTRATIVE     I have personally reviewed and interpreted the images (as available).  Point-of-care ultrasound imaging is on file and stored in a permanent location (if performed).  I have personally reviewed prior records and incorporated relevant information above (as available).    @SMIBILLING @    PROCEDURES     Med Joint Inj: R ulnocarpal on 10/20/2022 3:40 PM  Indications: pain and joint swelling  Details: 22 G needle, dorsal approach  Laterality: right  Location: wrist  Medications: 10 mg triamcinolone acetonide 10 mg/mL  Outcome: tolerated well, no immediate complications  Procedure, treatment alternatives, risks and benefits explained, specific risks discussed. Consent was given by the patient. Immediately prior to procedure a time out was called to verify the correct patient, procedure, equipment, support staff and site/side marked as required. Patient was prepped and draped in the usual sterile fashion.     Medical Care Team Attestation: All ProcDoc orders were read back and verbally confirmed with the procedure provider, including but not limited to patient name, medication name, dose, and route, before any actions were taken.  Provider Attestation: The information documented by members of my medical care team was reviewed and verified for accuracy by me.           DME     DME ORDER:  Dx:  ,

## 2022-10-22 ENCOUNTER — Ambulatory Visit: Admit: 2022-10-22 | Discharge: 2022-10-23 | Payer: MEDICARE | Attending: Adult Health | Primary: Adult Health

## 2022-10-22 ENCOUNTER — Ambulatory Visit: Admit: 2022-10-22 | Discharge: 2022-10-23 | Payer: MEDICARE | Attending: Nephrology | Primary: Nephrology

## 2022-10-22 DIAGNOSIS — I5032 Chronic diastolic (congestive) heart failure: Principal | ICD-10-CM

## 2022-10-22 DIAGNOSIS — I10 Essential (primary) hypertension: Principal | ICD-10-CM

## 2022-10-22 DIAGNOSIS — D500 Iron deficiency anemia secondary to blood loss (chronic): Principal | ICD-10-CM

## 2022-10-22 DIAGNOSIS — R944 Abnormal results of kidney function studies: Principal | ICD-10-CM

## 2022-10-22 DIAGNOSIS — I4819 Other persistent atrial fibrillation: Principal | ICD-10-CM

## 2022-10-22 DIAGNOSIS — N184 Chronic kidney disease, stage 4 (severe): Principal | ICD-10-CM

## 2022-10-22 DIAGNOSIS — J42 Unspecified chronic bronchitis: Principal | ICD-10-CM

## 2022-10-22 DIAGNOSIS — R8281 Pyuria: Principal | ICD-10-CM

## 2022-10-22 DIAGNOSIS — I272 Pulmonary hypertension, unspecified: Principal | ICD-10-CM

## 2022-10-22 DIAGNOSIS — R8271 Bacteriuria: Principal | ICD-10-CM

## 2022-10-22 LAB — COMPREHENSIVE METABOLIC PANEL
ALBUMIN: 3.8 g/dL (ref 3.4–5.0)
ALKALINE PHOSPHATASE: 99 U/L (ref 46–116)
ALT (SGPT): <7 U/L — ABNORMAL LOW (ref 10–49)
ANION GAP: 9 mmol/L (ref 5–14)
AST (SGOT): 12 U/L (ref <=34)
BILIRUBIN TOTAL: 0.5 mg/dL (ref 0.3–1.2)
BLOOD UREA NITROGEN: 71 mg/dL — ABNORMAL HIGH (ref 9–23)
BUN / CREAT RATIO: 33
CALCIUM: 9.5 mg/dL (ref 8.7–10.4)
CHLORIDE: 105 mmol/L (ref 98–107)
CO2: 27.5 mmol/L (ref 20.0–31.0)
CREATININE: 2.14 mg/dL — ABNORMAL HIGH
EGFR CKD-EPI (2021) FEMALE: 23 mL/min/{1.73_m2} — ABNORMAL LOW (ref >=60)
GLUCOSE RANDOM: 157 mg/dL (ref 70–179)
POTASSIUM: 4 mmol/L (ref 3.4–4.8)
PROTEIN TOTAL: 7.9 g/dL (ref 5.7–8.2)
SODIUM: 141 mmol/L (ref 135–145)

## 2022-10-22 LAB — CBC
HEMATOCRIT: 41 % (ref 34.0–44.0)
HEMOGLOBIN: 13.2 g/dL (ref 11.3–14.9)
MEAN CORPUSCULAR HEMOGLOBIN CONC: 32.2 g/dL (ref 32.0–36.0)
MEAN CORPUSCULAR HEMOGLOBIN: 30.4 pg (ref 25.9–32.4)
MEAN CORPUSCULAR VOLUME: 94.5 fL (ref 77.6–95.7)
MEAN PLATELET VOLUME: 9 fL (ref 6.8–10.7)
PLATELET COUNT: 203 10*9/L (ref 150–450)
RED BLOOD CELL COUNT: 4.35 10*12/L (ref 3.95–5.13)
RED CELL DISTRIBUTION WIDTH: 13.1 % (ref 12.2–15.2)
WBC ADJUSTED: 8 10*9/L (ref 3.6–11.2)

## 2022-10-22 LAB — B-TYPE NATRIURETIC PEPTIDE: B-TYPE NATRIURETIC PEPTIDE: 211.14 pg/mL — ABNORMAL HIGH (ref <=100)

## 2022-10-22 LAB — TSH: THYROID STIMULATING HORMONE: 1.214 u[IU]/mL (ref 0.550–4.780)

## 2022-10-22 LAB — MAGNESIUM: MAGNESIUM: 2.7 mg/dL — ABNORMAL HIGH (ref 1.6–2.6)

## 2022-10-22 MED ORDER — TORSEMIDE 20 MG TABLET
ORAL_TABLET | Freq: Every day | ORAL | 3 refills | 90 days | Status: CP
Start: 2022-10-22 — End: 2023-10-22

## 2022-10-22 NOTE — Unmapped (Addendum)
Medicines to Avoid With Kidney Disease: Care Instructions  Overview     Kidney disease means that your kidneys are not able to get rid of waste from the blood. So they can't keep your body's fluids and chemicals in balance. Usually, the kidneys get rid of waste from the blood through the urine. And they balance the fluids in the body.  When your kidneys don't work as they should, you have to be careful about some medicines. They may harm your kidneys. Your doctor may tell you not to take them or may change the dose.  Medicines for pain and swelling, such as ibuprofen (Advil or Motrin) or naproxen (Aleve), can cause harm. So can some antibiotics and antacids. And you need to be careful about some drugs that treat cancer, lower blood pressure, or get rid of water from the body. Some herbal products could cause harm too.  Follow-up care is a key part of your treatment and safety. Be sure to make and go to all appointments, and call your doctor if you are having problems. It's also a good idea to know your test results and keep a list of the medicines you take.  How can you care for yourself at home?  Tell your doctor all the prescription, herbal, or over-the-counter medicines you take. Do not take any new ones unless you talk to your doctor first.  Do not take anti-inflammatory medicines. These include ibuprofen (Advil, Motrin) and naproxen (Aleve). You can use acetaminophen (Tylenol) for pain.  Do not take two or more pain medicines at the same time unless the doctor told you to. Many pain medicines have acetaminophen, which is Tylenol. Too much acetaminophen (Tylenol) can be harmful.  Tell all doctors and others who work with your health care that you have kidney disease.  Wear medical alert jewelry that lists your health problem. You can buy this at most drugstores.  Where can you learn more?  Go to [facility] at [email or web address].[email or web address] forces logistics/support/administrative officer in the Menu. Enter [phone] in the search box to learn more about Medicines to Avoid With Kidney Disease: Care Instructions.  Current as of: September 04, 2020               Content Version: 13.1  ?? 2006-2021 [employer].   Care instructions adapted under license by [facility]. If you have questions about a medical condition or this instruction, always ask your healthcare professional. [employer] disclaims any warranty or liability for your use of this.    Dear Ms Boulay    It was good to see you today.  I think you are doing very well with respect to your kidneys.  Your kidney function remains at stage IV chronic kidney disease based on the last several tests.  You are managing your fluid well.  Your blood pressure is under good control.  As always, please be careful with any new medications and avoid over-the-counter medications, especially NSAIDs.    Keep doing what you are doing!    I like to see you back in about 3 to 4 months.    Sincerely,    Dr. Kirtland Bouchard

## 2022-10-22 NOTE — Unmapped (Addendum)
Today,    MEDICATIONS:  NO medication changes today.    Call if you have questions about your medications.    LABS:  We will call you if your labs need attention.    NEXT APPOINTMENT:  Return to clinic in 6 weeks with  Dr Wonda Cheng      In general, to take care of your heart failure:  -Limit your fluid intake to 2 Liters (half-gallon) per day.    -Limit your salt intake to ideally 2-3 grams (2000-3000 mg) per day.  -Weigh yourself daily and record, and bring that weight diary to your next appointment.  (Weight gain of 2-3 pounds in 1 day typically means fluid weight.)    The medications for your heart are to help your heart and help you live longer.    Please contact us before stopping any of your heart medications.    Call the clinic at [phone] with questions.  Our clinic fax number is [phone].  If you need to reschedule future appointments, please call [phone] or [phone]  My office number (c/o De Burrs RN) is [phone] if you need further assistance.  After office hours, if you have urgent questions/problems, contact the on-call cardiologist through the hospital operator: [phone].    Please do not send a MyChart message for potentially life-threatening symptoms.  Please call 911 for a true medical emergency.    To learn more about heart failure, please read [facility] Learning to Live with Heart Failure - Available online at:  [email or web address] - open the window for Medical Management and click the link Living with Heart Failure   (Can search [facility] heart failure on the web to find the link.)

## 2022-10-22 NOTE — Unmapped (Signed)
Hello referring Provider: Artelia Laroche, MD     PCP:  Artelia Laroche, MD      10/22/2022      ASSESSMENT/PLAN:      [name] is a 81 y.o. year old patient with a past medical history significant for diabetes mellitus, pretension, COPD and a recent diagnosis of cardiac amyloid.  She is being seen in follow-up for chronic kidney disease.     1.  Chronic kidney disease  Stage IV.  Likely etiology combination of diabetes mellitus and hypertension.  Most recent GFR from March 2023 is 21 mL/min with a serum creatinine of 2.33 mg/dL.  It is unlikely that there is amyloid infiltration of the kidneys given the type of amyloidosis that she has.  I took this opportunity to review late stage [name] management, especially avoidance of nephrotoxins such as NSAIDs, and judicious use of any medications.  She is on a statin agent.      She is not on ACE inhibitor, however her blood pressure is well controlled, and she does not have proteinuria.     2.  Diabetes mellitus type 2  She is currently on insulin.  If her kidney function continues to improve, she may be a candidate for 1 of these agents, an SGLT2 inhibitor or GLP-1 receptor agonist.  At this point in time, I will not make any changes as the SGLT2 inhibitor and also has a diuretic effect.    3.  Hypertension  This appears to be well controlled on the current regimen of spironolactone and torsemide, and metolazone as needed.  She typically takes torsemide daily, with extra doses to reach a weight of 213 pounds or less.  She is taking metolazone less than once a month.    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    5.  Atrial fibrillation  This appears to be stable.  She is on apixaban and amiodarone.    6.  Chronic obstructive pulmonary disease, O2 dependent  Stable.    7.  Anemia  This is a combination of iron deficiency and [name].  Her primary physician has repleted her iron stores with intravenous iron, and she has had a marked improvement in her hemoglobin to 13.6 mg/dL.      [name] will follow up in 3-4 months.       I personally spent 41 minutes face-to-face and non-face-to-face in the care of this patient, which includes all pre, intra, and post visit time on the date of service.             Background: Patient has chronic kidney disease stage IV plan latest EGFR= 51ml/min and cardiac amyloid.  Etiology is not clear, but risk factors include hypertension and diabetes mellitus.  She does not have any proteinuria or albuminuria.  The lack of albuminuria can be seen in 30% of patients with diabetes and low GFR.      HPI:  [name] to the [facility] nephrology clinic for scheduled follow-up.  I last saw her about 3 months ago.  She is again accompanied by her daughter today.    In the interval, she denies any hospitalizations or major medicine changes.  Overall she feels well, and is at her baseline level of functioning.   Her exercise tolerance is limited, but she denies any shortness of breath, chest pain or chest tightness.  She denies any urinary symptoms such as dysuria, gross hematuria, incomplete voiding.  She states that she takes the torsemide daily, sometimes extra doses depending on her weight when it is over 213 pounds.  She has not needed to take the metolazone regularly, and estimates that she takes it approximately once per month when the extra dose of torsemide is not effective.    She is denies any anorexia, metallic taste in her mouth, nausea or vomiting.  She reports stable urine output.  She denies any gross hematuria or discolored urine.    Otherwise as per ROS      ROS:   CONSTITUTIONAL: denies fevers or chills, denies unintentional weight loss  CARDIOVASCULAR: denies chest pain, denies dyspnea on exertion, denies leg edema  GASTROINTESTINAL: denies nausea, denies vomiting, denies anorexia  GENITOURINARY: denies dysuria, denies hematuria, denies decreased urinary stream  All systems reviewed and are negative except as listed above.    PAST MEDICAL HISTORY:  Past Medical History:   Diagnosis Date    Acute kidney injury superimposed on chronic kidney disease (CMS-HCC) 10/11/2020    Acute on chronic diastolic (congestive) heart failure (CMS-HCC) 08/23/2020    AKI (acute kidney injury) (CMS-HCC) 04/14/2015    Lab Results  Component  Value  Date     CREATININE  1.90 (H)  06/12/2021     Had a bump in her creatinine when she was taking her diuretics every day.  She is currently taking 40 mg daily of torsemide and 50 mg of spironolactone.  Her volume status is fragile.  Previously when she stopped her diuretic she becomes short of breath.  Plan: We will check her BMP today.  We will likely have to go to 40    Arthritis     Calculus of kidney     Calculus of ureter     CHF (congestive heart failure) (CMS-HCC)     Chronic atrial fibrillation (CMS-HCC) 07/20/2019    COPD (chronic obstructive pulmonary disease) (CMS-HCC)     Diabetes (CMS-HCC)     Gangrenous cholecystitis 10/11/2020    Generalized edema  06/17/2021    GERD (gastroesophageal reflux disease)     Hydronephrosis     Hypertension     Hyponatremia 10/11/2020    Intermediate coronary syndrome (CMS-HCC) 03/13/2014    Lower extremity edema 09/28/2020    Lumbar stenosis     Microscopic hematuria     Nausea alone     Nephrolithiasis 04/17/2016    Neuropathy     Nocturia     Nocturia 07/01/2017    Other chronic cystitis     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  See plans for anemia, AKI  We are also tapering her gabapentin.  Currently on 300 mg 3 times daily.  We will decrease it to twice daily, and then nightly.  She states she is not having any recurrence of her pain.    Pulmonary hypertension (CMS-HCC)     Renal colic     Shortness of breath 04/28/2020    Sleep apnea     Unstable angina pectoris (CMS-HCC) 03/13/2014            MEDICATIONS:  Current Outpatient Medications   Medication Sig Dispense Refill    acetaminophen (TYLENOL) 500 MG tablet Take 2 tablets (1,000 mg total) by mouth daily as needed for pain.      albuterol HFA 90 mcg/actuation inhaler Inhale 2 puffs every eight (8) hours as needed for wheezing.      amiodarone (PACERONE) 200 MG tablet Take 1  tablet (200 mg total) by mouth daily. 90 tablet 3    blood sugar diagnostic (ACCU-CHEK GUIDE TEST STRIPS) Strp Use to check blood sugar daily as instructed by clinic. Dx E11.9 For daily testing 100 strip 3    blood-glucose meter kit Use as instructed Daily testing 1 each 0    cranberry 500 mg cap Take 500 mg by mouth daily with evening meal.      estradioL (ESTRACE) 0.01 % (0.1 mg/gram) vaginal cream Insert 2 g into the vagina Two (2) times a week. 42.5 g 3    fluticasone propion-salmeteroL (ADVAIR HFA) 115-21 mcg/actuation inhaler Inhale 2 puffs Two (2) times a day. 12 g 11    fosfomycin (MONUROL) 3 gram Pack Take 3 g by mouth once a week. 36 g 3    gabapentin (NEURONTIN) 300 MG capsule Take 1 capsule (300 mg total) by mouth in the morning. 90 capsule 3    insulin glargine (BASAGLAR, LANTUS) 100 unit/mL (3 mL) injection pen Inject 0.04 mL (4 Units total) under the skin nightly. 15 mL 3    lancets Misc 1 each by Miscellaneous route daily. Accu Check 100 each 6    metOLazone (ZAROXOLYN) 5 MG tablet Take 1 tablet (5 mg total) by mouth daily as needed (when instructed by cardiology clinic). 30 tablet 1    montelukast (SINGULAIR) 10 mg tablet TAKE 1 TABLET(10 MG) BY MOUTH IN THE MORNING 90 tablet 2    NARCAN 4 mg/actuation nasal spray 1 spray into alternating nostrils once as needed (opioid overdose). PRN - Emergency use.      NON FORMULARY APPLY FROM NECK DOWN TWICE A DAY      OXYCONTIN 10 mg TR12 12 hr crush resistant ER/CR tablet Take 1 tablet (10 mg total) by mouth every twelve (12) hours.      OXYGEN-AIR DELIVERY SYSTEMS MISC 5 L by Miscellaneous route. Currently using 3  L/min via [place]      pantoprazole (PROTONIX) 20 MG tablet Take 1 tablet (20 mg total) by mouth daily. 60 tablet 5    pen needle, diabetic (BD ULTRA-FINE SHORT PEN NEEDLE) 31 gauge x 5/16 (8 mm) Ndle Give 4 units of insulin each night 100 each 1    rosuvastatin (CRESTOR) 5 MG tablet Take 1 tablet (5 mg total) by mouth every other day. 15 tablet 11    sertraline (ZOLOFT) 25 MG tablet Take 1 tablet (25 mg total) by mouth daily. 90 tablet 3    spironolactone (ALDACTONE) 25 MG tablet Take 1 tablet (25 mg total) by mouth daily. 90 tablet 3    tafamidis (VYNDAMAX) 61 mg cap Take 1 capsule (61 mg) by mouth daily. 30 capsule 11    tiotropium bromide (SPIRIVA RESPIMAT) 2.5 mcg/actuation inhalation mist Inhale 2 puffs daily. 4 g 11    torsemide (DEMADEX) 20 MG tablet Take 3 tablets (60 mg total) by mouth daily. 270 tablet 3    varicella-zoster [id number], PF, (SHINGRIX, PF,) 50 mcg/0.5 mL SusR injection Inject 0.5 mL into the muscle. 0.5 mL 1     No current facility-administered medications for this visit.       PHYSICAL EXAM:  Vitals:    10/22/22 1317   BP: 119/56   Pulse: 59   Temp: 37 ??C (98.6 ??F)       CONSTITUTIONAL: Alert,well appearing, no distress, seated in wheelchair, wearing oxygen  HEENT: Moist mucous membranes, oropharynx clear without erythema or exudate  EYES:  Pupils reactive, sclerae anicteric.  NECK: Supple  CARDIOVASCULAR: Regular, normal S1/S2 heart sounds, no murmurs, no rubs.   PULM: Clear to auscultation bilaterally  GASTROINTESTINAL: Soft, active bowel sounds, nontender  EXTREMITIES: No lower extremity edema bilaterally.   SKIN: No rashes or lesions  NEUROLOGIC: No focal motor or sensory deficits    MEDICAL DECISION MAKING    Component      Latest Ref Rng 02/13/2022 02/19/2022 03/13/2022   Sodium      135 - 145 mmol/L 137  141  139    Potassium      3.4 - 4.8 mmol/L 4.4  4.4  4.5    Chloride      98 - 107 mmol/L 97 (L)  101  103    CO2      20.0 - 31.0 mmol/L 34.3 (H)  31.5 (H)  27.4    Bun      9 - 23 mg/dL 66 (H)  54 (H)  59 (H)    Creatinine      0.60 - 0.80 mg/dL 1.61 (H)  0.96 (H)  0.45 (H)    Anion Gap trace, nitrite negative, protein negative, glucose negative, ketone negative, bilirubin negative, blood trace intact, urobilinogen 0.2    Urine microscopy: Numerous squamous epithelial cells, clumps of white blood cells, moderate bacteria present, no crystals, no casts, no red blood cells    Component      Latest Ref Rng 07/15/2022 08/19/2022 10/22/2022   Sodium      135 - 145 mmol/L 138  140  141    Potassium      3.4 - 4.8 mmol/L 4.4  4.4  4.0    Chloride      98 - 107 mmol/L 103  105  105    CO2      20.0 - 31.0 mmol/L 26.4  26.3  27.5    Bun      9 - 23 mg/dL 71 (H)  54 (H)  71 (H)    Creatinine      0.55 - 1.02 mg/dL 4.09 (H)  8.11 (H)  9.14 (H)    BUN/Creatinine Ratio 28  23  33    Anion Gap      5 - 14 mmol/L 9  9  9     Glucose      70 - 179 mg/dL 782  956  213    Calcium      8.7 - 10.4 mg/dL 9.7  9.1  9.5    Albumin      3.4 - 5.0 g/dL   3.8    eGFR [name]-EPI (2021) Female      >=60 mL/min/1.63m2 18 (L)  20 (L)  23 (L)

## 2022-10-22 NOTE — Unmapped (Signed)
[facility] Note    Referring Provider: Artelia Laroche, MD  [phone] [facility] [address] [place],  [place] [address]   Primary Provider: Artelia Laroche, MD  [address]  [place] [address]   Other Providers:  Dr Luretha Murphy    Reason for Visit:  Barbara Huber is a 81 y.o. female being seen for routine visit and continued care of cardiac amyloidosis .    Assessment & Plan:  1. 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 SPEP/UPEP/free light chains without concern. 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 Daughter is informed.   - DC'd Jardiance 10mg  daily due to recurrent UTIs  - Weight is at baseline with stable/improved renal function. Continue torsemide 40mg  daily with extra 20mg  PRN.   - Continue Tafamidis 61 mg daily   - Continue spironolactone 25 mg daily   - She is not interested in CardioMEMS at this time    2. Persistent atrial fibrillation  - CHA2DS2-VASc score = 5 (1-gender, 2-Age, 1-DM, 1-HTN)  - We stopped Eliquis given recurrent anemia, shared decision making w/ pt and daughter. She is not interested in Platinum (or any procedures) at this time  - She had been in afib since at least 07/2020; now s/p cardioversion 01/10/21 and continues to be in NSR since  - On amiodarone 200mg  daily - increased it back herself after seeing no improvement in tremors/jerking with decreased dose  - amiodarone monitoring - last PFTs in 04/2021. TSH and LFTs today normal.    3. [name]  - Cr staying around 2.3-2.5; 2.14 today    4. COPD  - home O2 3L [place] - followed by pulm  - on Advair and Spiriva    5. Iron deficiency anemia  - Finished IV Iron - ferritin 178. No longer anemic, Hgb 13.2  Lab Results Component Value Date    FERRITIN 178.0 08/19/2022     6. Anxiety  - Tolerating Zoloft 25mg  with improved moved/anxiety    Follow-up:  Return in about 6 weeks (around 12/03/2022) for already scheduled appointment.     History of Present Illness:  Barbara Huber is a 81 y.o. female with PMHx of COPD on 3L home [place], CHF, [name], [name], [name] She was initially admitted with decompensated [name] in 2021 in setting of  Septic Shock d/t Gangrenous Cholecystitis s/p cholecystectomy. She underwent further evaluation with PYP scan which showed TTR amyloid. She has since been followed in clinic for management of her [name]. In 2022, had numerous admissions for weakness/urosepsis, shortness of breath/orthopnea/weight gain, lightheadedness/SOB/Blurry vision found to be anemic.    Summary of most recent visits:  - 01/16/22 [facility]): Feeling well from a cardiac standpoint. Started Zoloft for anxiety  - 02/13/22 [facility]): Weight 210-211. Feeling well. Taking extra torsemide 20mg  about every other day, which was stopped for elevated Cr.   - 03/13/22 ([name]): Weight 201-216, took metolazone PRN, weight stabilized at 210lbs. Considering cardioMEMS.   - 05/13/22 ([name]): Had 3 iron infusions. Doing well w/ volume, weight 211-212lbs. Cooking, staying active around house.   - 05/21/22 ([name]): CP/SOB. Weight  217 from 213. She took metolazone x1 dose.   - 05/22/22 ([name]): Ed follow up. Back down to 213lbs. Still having CP w/ certain movement.   - 06/05/22 ([name]): Weight 214-216lb, should be 212lb.   - 06/09/22, 06/11/22, 06/22/22 ([name]): Weight down from 217 to 213lb, 211lb at home. Feeling well by 6/26.   - 07/15/22 ([name]): Switched inhaler back to Advair/Spiriva with improvement in breathing and O2 sat. Weight 207lbs, feeling more like herself.   - 08/19/22 ([name]): Feeling well. Weight 208-210lbs.     Interval history  Today she presents for follow up. She continues to do well. Her volume status is stable on torsemide 40 daily and extra 20 PRN. She takes extra if she sees weight gain or feels like she needs it. Her weight has been 210-214lbs. Her energy is good. She has no LE edema or bloating, minimal SOB, and no orthopnea/PND. She is doing some cooking. Appetite has been very good.     Cardiovascular History & Procedures:    Cath / PCI:  none    CV Surgery:   none    EP Procedures and Devices:  none    Non-Invasive Evaluation(s):    Echo:  12/09/21  Summary    1. The left ventricle is normal in size with severely increased wall  thickness.    2. The left ventricular systolic function is hyperdynamic, LVEF is visually  estimated at >70%.    3. There is grade III diastolic dysfunction (severely elevated filling  pressure).    4. The left atrium is moderately to severely dilated in size.    5. The right ventricle is mildly dilated in size, with normal systolic  function.    6. The right atrium is mildly dilated  in size.    7. IVC size and inspiratory change suggest elevated right atrial pressure.  (10-20 mmHg).    04/24/21   1. The left ventricle is normal in size with mildly increased wall  thickness.    2. The left ventricular systolic function is normal, LVEF is visually  estimated at > 55%.    3. There is grade II diastolic dysfunction (elevated filling pressure).    4. Mitral annular calcification is present (mild).    5. The left atrium is mildly dilated in size.    6. The right ventricle is mildly dilated in size, with low normal systolic  function.    7. IVC size and inspiratory change suggest elevated right atrial pressure.  (10-20 mmHg).    09/30/20  Summary    1. Limited study to assess systolic function.    2. IVC size and inspiratory change suggest normal right atrial pressure.  (0-5 mmHg).    3. The left ventricle is normal in size with mildly to moderately increased  wall thickness.    4. The left ventricular systolic function is normal with no obvious wall  motion abnormalities, LVEF is visually estimated at > 55%.    5. Mitral annular calcification is present.    6. The left atrium is mildly dilated in size.    7. The right ventricle is upper normal in size, with reduced systolic  Function.    Cardiac CT/MRI/Nuclear Tests:   None    6 Minute Walk:  None    Cardiopulmonary Stress Tests:   None               Other Past Medical History:  See below for the complete EPIC list of past  medical and surgical history.      Allergies:  Nitrofurantoin and Lipitor [atorvastatin]    Current Medications:  Current Outpatient Medications   Medication Sig Dispense Refill    acetaminophen (TYLENOL) 500 MG tablet Take 2 tablets (1,000 mg total) by mouth daily as needed for pain.      albuterol HFA 90 mcg/actuation inhaler Inhale 2 puffs every eight (8) hours as needed for wheezing.      amiodarone (PACERONE) 200 MG tablet Take 1 tablet (200 mg total) by mouth daily. 90 tablet 3    blood sugar diagnostic (ACCU-CHEK GUIDE TEST STRIPS) Strp Use to check blood sugar daily as instructed by clinic. Dx E11.9 For daily testing 100 strip 3    blood-glucose meter kit Use as instructed Daily testing 1 each 0    cranberry 500 mg cap Take 500 mg by mouth daily with evening meal.      estradioL (ESTRACE) 0.01 % (0.1 mg/gram) vaginal cream Insert 2 g into the vagina Two (2) times a week. 42.5 g 3    fluticasone propion-salmeteroL (ADVAIR HFA) 115-21 mcg/actuation inhaler Inhale 2 puffs Two (2) times a day. 12 g 11    fosfomycin (MONUROL) 3 gram Pack Take 3 g by mouth once a week. 36 g 3    gabapentin (NEURONTIN) 300 MG capsule Take 1 capsule (300 mg total) by mouth in the morning. 90 capsule 3    insulin glargine (BASAGLAR, LANTUS) 100 unit/mL (3 mL) injection pen Inject 0.04 mL (4 Units total) under the skin nightly. 15 mL 3    lancets Misc 1 each by Miscellaneous route daily. Accu Check 100 each 6    montelukast (SINGULAIR) 10 mg tablet TAKE 1 TABLET(10 MG) BY MOUTH IN THE MORNING 90 tablet 2    NARCAN 4 mg/actuation nasal spray 1 spray into alternating nostrils once as needed (opioid overdose). PRN - Emergency use.      NON FORMULARY APPLY FROM NECK DOWN TWICE A DAY      OXYCONTIN 10 mg TR12 12 hr crush resistant ER/CR tablet Take 1 tablet (10 mg total) by mouth every twelve (12) hours.      OXYGEN-AIR DELIVERY SYSTEMS MISC 5 L by Miscellaneous route. Currently using 3  L/min via [place]      pantoprazole (PROTONIX) 20 MG tablet Take 1 tablet (20 mg total) by mouth daily. 60 tablet 5    pen needle, diabetic (BD ULTRA-FINE SHORT PEN NEEDLE) 31 gauge x 5/16 (8 mm) Ndle Give 4 units of insulin each night 100 each 1    rosuvastatin (CRESTOR) 5 MG tablet Take 1 tablet (5 mg total) by mouth every other day. 15 tablet 11    sertraline (ZOLOFT) 25 MG tablet Take 1 tablet (25 mg total) by mouth daily. 90 tablet 3    spironolactone (ALDACTONE) 25 MG tablet Take 1 tablet (25 mg total) by mouth daily. 90 tablet 3    tafamidis (VYNDAMAX) 61 mg cap Take 1 capsule (61 mg) by mouth daily. 30 capsule 11    tiotropium bromide (SPIRIVA RESPIMAT) 2.5 mcg/actuation inhalation mist Inhale 2 puffs daily. 4 g 11    varicella-zoster [id number], PF, (SHINGRIX, PF,) 50 mcg/0.5 mL SusR injection Inject 0.5 mL into the muscle. 0.5 mL 1    metOLazone (ZAROXOLYN) 5 MG tablet Take 1 tablet (5 mg total) by mouth daily as needed (when instructed by cardiology clinic). 30 tablet 1    torsemide (DEMADEX) 20 MG tablet Take 3 tablets (60 mg total)  by mouth daily. 270 tablet 3     No current facility-administered medications for this visit.       Family History:  The patient's family history includes Cancer in her father; Hypertension in her mother.    Social history:  She  reports that she quit smoking about 4 years ago. Her smoking use included cigarettes. She smoked an average of .33 packs per day. She has never used smokeless tobacco. She reports that she does not drink alcohol and does not use drugs.    Review of Systems:  As per HPI.  Rest of the review of ten systems is negative or unremarkable except as stated above.    Physical Exam:  VITAL SIGNS:   Vitals:    10/22/22 1230   BP: 130/68   Pulse: 58   SpO2: 96%          Wt Readings from Last 3 Encounters:   10/22/22 97.5 kg (215 lb)   10/22/22 97.5 kg (215 lb)   10/15/22 97.1 kg (214 lb)      Today's Body mass index is 34.7 kg/m??.   Height: 167.6 cm (5' 6)  CONSTITUTIONAL: well-appearing in no acute distress  EYES: Conjunctivae and sclerae clear and anicteric.  ENT: Benign.   CARDIOVASCULAR: JVP not seen above the clavicle with HOB at 90 degrees. Rate and rhythm are regular.  There is no lifts or heaves.  Normal S1, S2. There is no murmur, gallops or rubs.  Radial and pedal pulses are 2+, bilaterally.   There is no edema to ankles, bilaterally.   RESPIRATORY: Decreased breath sounds at bases.  There are no wheezes.  GASTROINTESTINAL: Soft, non-tender, with audible bowel sounds. Abdomen nondistended.  Liver is nonpalpable.  SKIN: No rashes, ecchymosis or petechiae.  Warm, well perfused.   MUSCULOSKELETAL:  no joint swelling   NEURO/PSYCH: Appropriate mood and affect. Alert and oriented to person, place, and time. No gross motor or sensory deficits evident.    Pertinent Laboratory Studies:   Office Visit on 10/22/2022   Component Date Value Ref Range Status    Spec Gravity/POC 10/22/2022 <=1.005  1.003 - 1.030 Final    PH/POC 10/22/2022 5.0  5.0 - 9.0 Final    Leuk Esterase/POC 10/22/2022 Trace (A)  Negative Final    Nitrite/POC 10/22/2022 Negative  Negative Final    Protein/POC 10/22/2022 Negative  Negative Final    UA Glucose/POC 10/22/2022 Negative  Negative Final    Ketones, POC 10/22/2022 Negative  Negative Final    Bilirubin/POC 10/22/2022 Negative  Negative Final    Blood/POC 10/22/2022 Trace-intact (A)  Negative Final    Urobilinogen/POC 10/22/2022 0.2  0.2 - 1.0 mg/dL Final   Office Visit on 10/22/2022   Component Date Value Ref Range Status    Sodium 10/22/2022 141  135 - 145 mmol/L Final    Potassium 10/22/2022 4.0  3.4 - 4.8 mmol/L Final    Chloride 10/22/2022 105  98 - 107 mmol/L Final    CO2 10/22/2022 27.5  20.0 - 31.0 mmol/L Final    Anion Gap 10/22/2022 9  5 - 14 mmol/L Final    BUN 10/22/2022 71 (H)  9 - 23 mg/dL Final    Creatinine 16/09/9603 2.14 (H)  0.55 - 1.02 mg/dL Final    BUN/Creatinine Ratio 10/22/2022 33   Final    eGFR [name]-EPI (2021) Female 10/22/2022 23 (L)  >=60 mL/min/1.55m2 Final    Glucose 10/22/2022 157  70 - 179 mg/dL Final  Calcium 10/22/2022 9.5  8.7 - 10.4 mg/dL Final    Albumin 16/09/9603 3.8  3.4 - 5.0 g/dL Final    Total Protein 10/22/2022 7.9  5.7 - 8.2 g/dL Final    Total Bilirubin 10/22/2022 0.5  0.3 - 1.2 mg/dL Final    AST 54/08/8118 12  <=34 U/L Final    ALT 10/22/2022 <7 (L)  10 - 49 U/L Final    Alkaline Phosphatase 10/22/2022 99  46 - 116 U/L Final    BNP 10/22/2022 211.14 (H)  <=100 pg/mL Final    Magnesium 10/22/2022 2.7 (H)  1.6 - 2.6 mg/dL Final    WBC 14/78/2956 8.0  3.6 - 11.2 10*9/L Final    RBC 10/22/2022 4.35  3.95 - 5.13 10*12/L Final    HGB 10/22/2022 13.2  11.3 - 14.9 g/dL Final    HCT 21/30/8657 41.0  34.0 - 44.0 % Final    MCV 10/22/2022 94.5  77.6 - 95.7 fL Final    MCH 10/22/2022 30.4  25.9 - 32.4 pg Final    MCHC 10/22/2022 32.2  32.0 - 36.0 g/dL Final    RDW 84/69/6295 13.1  12.2 - 15.2 % Final    MPV 10/22/2022 9.0  6.8 - 10.7 fL Final    Platelet 10/22/2022 203  150 - 450 10*9/L Final    TSH 10/22/2022 1.214  0.550 - 4.780 uIU/mL Final   Office Visit on 08/19/2022   Component Date Value Ref Range Status    BNP 08/19/2022 167.03 (H)  <=100 pg/mL Final    Sodium 08/19/2022 140  135 - 145 mmol/L Final    Potassium 08/19/2022 4.4  3.4 - 4.8 mmol/L Final    Chloride 08/19/2022 105  98 - 107 mmol/L Final    CO2 08/19/2022 26.3  20.0 - 31.0 mmol/L Final    Anion Gap 08/19/2022 9  5 - 14 mmol/L Final    BUN 08/19/2022 54 (H)  9 - 23 mg/dL Final    Creatinine 28/41/3244 2.35 (H)  0.60 - 0.80 mg/dL Final    BUN/Creatinine Ratio 08/19/2022 23   Final    eGFR [name]-EPI (2021) Female 08/19/2022 20 (L)  >=60 mL/min/1.66m2 Final    Glucose 08/19/2022 109  70 - 179 mg/dL Final    Calcium 12/30/7251 9.1  8.7 - 10.4 mg/dL Final    Magnesium 66/44/0347 2.5  1.6 - 2.6 mg/dL Final    Ferritin 42/59/5638 178.0  7.3 - 270.7 ng/mL Final    WBC 08/19/2022 7.9  3.6 - 11.2 10*9/L Final    RBC 08/19/2022 4.58  3.95 - 5.13 10*12/L Final    HGB 08/19/2022 13.6  11.3 - 14.9 g/dL Final    HCT 75/64/3329 42.0  34.0 - 44.0 % Final    MCV 08/19/2022 91.5  77.6 - 95.7 fL Final    MCH 08/19/2022 29.7  25.9 - 32.4 pg Final    MCHC 08/19/2022 32.5  32.0 - 36.0 g/dL Final    RDW 51/88/4166 15.3 (H)  12.2 - 15.2 % Final    MPV 08/19/2022 9.2  6.8 - 10.7 fL Final    Platelet 08/19/2022 163  150 - 450 10*9/L Final    Iron 08/19/2022 74  50 - 170 ug/dL Final    TIBC 06/26/1600 243 (L)  250 - 425 ug/dL Final    Iron Saturation (%) 08/19/2022 30  20 - 55 % Final   Office Visit on 07/15/2022   Component Date Value Ref Range Status  BNP 07/15/2022 185.22 (H)  <=100 pg/mL Final    Sodium 07/15/2022 138  135 - 145 mmol/L Final    Potassium 07/15/2022 4.4  3.4 - 4.8 mmol/L Final    Chloride 07/15/2022 103  98 - 107 mmol/L Final    CO2 07/15/2022 26.4  20.0 - 31.0 mmol/L Final    Anion Gap 07/15/2022 9  5 - 14 mmol/L Final    BUN 07/15/2022 71 (H)  9 - 23 mg/dL Final    Creatinine 97/35/3299 2.57 (H)  0.60 - 0.80 mg/dL Final    BUN/Creatinine Ratio 07/15/2022 28   Final    eGFR [name]-EPI (2021) Female 07/15/2022 18 (L)  >=60 mL/min/1.57m2 Final    Glucose 07/15/2022 151  70 - 179 mg/dL Final    Calcium 24/26/8341 9.7  8.7 - 10.4 mg/dL Final    Magnesium 96/22/2979 2.5  1.6 - 2.6 mg/dL Final       Lab Results   Component Value Date    PRO-BNP 3,420.0 (H) 07/03/2021    PRO-BNP 905.0 (H) 03/21/2021    PRO-BNP 3,796.0 (H) 12/23/2020    Creatinine 2.14 (H) 10/22/2022    Creatinine 2.35 (H) 08/19/2022    Creatinine 0.79 01/06/2011    BUN 71 (H) 10/22/2022    BUN 54 (H) 08/19/2022    BUN 20 01/06/2011    Sodium 141 10/22/2022    Sodium 140 01/06/2011 Potassium 4.0 10/22/2022    Potassium 4.1 01/06/2011    CO2 27.5 10/22/2022    CO2 30 01/06/2011    Magnesium 2.7 (H) 10/22/2022    Magnesium 2.0 01/06/2011    Total Bilirubin 0.5 10/22/2022    INR 2.15 07/01/2021    INR 1.0 01/06/2011       Lab Results   Component Value Date    Digoxin Level 0.9 10/01/2020       Lab Results   Component Value Date    TSH 1.214 10/22/2022    Cholesterol 95 12/06/2021    Triglycerides 66 12/06/2021    HDL 42 12/06/2021    Non-HDL Cholesterol 53 (L) 12/06/2021    LDL Calculated 40 12/06/2021       Lab Results   Component Value Date    WBC 8.0 10/22/2022    WBC 12.5 (H) 01/06/2011    HGB 13.2 10/22/2022    HGB 12.9 01/06/2011    HCT 41.0 10/22/2022    HCT 40.8 01/06/2011    Platelet 203 10/22/2022    Platelet 260 01/06/2011       Pertinent Test Results from Today:  None    Other pertinent records were reviewed.    The following are further history from the patient's EPIC record for reference:     Past Medical History:   Diagnosis Date    Acute kidney injury superimposed on chronic kidney disease (CMS-HCC) 10/11/2020    Acute on chronic diastolic (congestive) heart failure (CMS-HCC) 08/23/2020    AKI (acute kidney injury) (CMS-HCC) 04/14/2015    Lab Results  Component  Value  Date     CREATININE  1.90 (H)  06/12/2021     Had a bump in her creatinine when she was taking her diuretics every day.  She is currently taking 40 mg daily of torsemide and 50 mg of spironolactone.  Her volume status is fragile.  Previously when she stopped her diuretic she becomes short of breath.  Plan: We will check her BMP today.  We will likely have to go to 40  Arthritis     Calculus of kidney     Calculus of ureter     CHF (congestive heart failure) (CMS-HCC)     Chronic atrial fibrillation (CMS-HCC) 07/20/2019    COPD (chronic obstructive pulmonary disease) (CMS-HCC)     Diabetes (CMS-HCC)     Gangrenous cholecystitis 10/11/2020    Generalized edema  06/17/2021    GERD (gastroesophageal reflux disease) Hydronephrosis     Hypertension     Hyponatremia 10/11/2020    Intermediate coronary syndrome (CMS-HCC) 03/13/2014    Lower extremity edema 09/28/2020    Lumbar stenosis     Microscopic hematuria     Nausea alone     Nephrolithiasis 04/17/2016    Neuropathy     Nocturia     Nocturia 07/01/2017    Other chronic cystitis     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  See plans for anemia, AKI  We are also tapering her gabapentin.  Currently on 300 mg 3 times daily.  We will decrease it to twice daily, and then nightly.  She states she is not having any recurrence of her pain.    Pulmonary hypertension (CMS-HCC)     Renal colic     Shortness of breath 04/28/2020    Sleep apnea     Unstable angina pectoris (CMS-HCC) 03/13/2014       Past Surgical History:   Procedure Laterality Date    BACK SURGERY  1995    CARPAL TUNNEL RELEASE Left 2014    HYSTERECTOMY  1971    IR INSERT CHOLECYSTOSMY TUBE PERCUTANEOUS  10/02/2020    IR INSERT CHOLECYSTOSMY TUBE PERCUTANEOUS 10/02/2020 Braulio Conte, MD IMG VIR [facility]    LUMBAR DISC SURGERY      PR REMOVAL GALLBLADDER N/A 10/06/2020    Procedure: CHOLECYSTECTOMY;  Surgeon: Katherina Mires, MD;  Location: MAIN OR [facility];  Service: Trauma    PR RIGHT HEART CATH O2 SATURATION & CARDIAC OUTPUT N/A 09/30/2020    Procedure: Right Heart Catheterization;  Surgeon: Neal Dy, MD;  Location: [facility] CATH;  Service: Cardiology    PR RIGHT HEART CATH O2 SATURATION & CARDIAC OUTPUT N/A 01/09/2021    Procedure: Right Heart Catheterization;  Surgeon: Lesle Reek, MD;  Location: [facility] CATH;  Service: Cardiology

## 2022-10-28 ENCOUNTER — Institutional Professional Consult (permissible substitution): Admit: 2022-10-28 | Discharge: 2022-10-29 | Payer: MEDICARE

## 2022-10-28 NOTE — Unmapped (Signed)
Internal Medicine Clinic Visit  Person Contacted: Patient  Contact Phone number: [phone] (home)   Is there someone else in the room? No.   Reason for visit: follow up    A/P:    Type 2 diabetes mellitus with stage 4 chronic kidney disease, with long-term current use of insulin (CMS-HCC)  Overview:  Lab Results   Component Value Date    A1C 5.6 03/13/2022    A1C 7.0 (H) 12/06/2021    A1C 5.9 08/27/2021     Diabetes, on lantus 4 unit qhs at bedtime. Due to low GFR, off other meds. Did not tolerate jardiance, due to UTI  BS s at home in range, we will watch for hypoglycemia  Plan: cont. 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 Followed by Dr Emi Holes  Diuresis can be complicated by worsening of her [name], but currently stable   Most recent Echo12/2022:   Summary    1. The left ventricle is normal in size with severely increased wall  thickness.    2. The left ventricular systolic function is hyperdynamic, LVEF is visually  estimated at >70%.    3. There is grade III diastolic dysfunction (severely elevated filling  pressure).    4. The left atrium is moderately to severely dilated in size.    5. The right ventricle is mildly dilated in size, with normal systolic  function.    6. The right atrium is mildly dilated  in size.    7. IVC size and inspiratory change suggest elevated right atrial pressure.  (10-20 mmHg).  On amiodarone 200 mg every day, torsemide 40 mg every day, tafamidis for TTR amyloid, spirinolactone 25 mg  Has iron infusions as needed  Wt stable  Euvolemic by report  Plan:   Cont current meds  Follows up with [facility] cardiology       Chronic kidney disease ([name]), stage IV (severe) (CMS-HCC)  Overview:  Lab Results Component Value Date    CREATININE 2.14 (H) 10/22/2022     Creatinine stable  Anticipate maintaining current dose of torsemide 40 mg every day. Also on spirinolactone.     Follows with [facility] nephrology  Return in about 3 months (around 01/28/2023).    HPI:   Pt is a 81 y.o. female with a history of COPD on 3L home [place], CHF, [name] secondary to amyloid, [name], T2DM.    BS: around low 100's. Still taking 4 units lantus at night. Maybe one time low blood sugar  Kidney function: Stable  Still on 3 liters of oxygen.   Feels like mood is good.   Sleeping well.   Eating: eats well.   Still on amiodarone.   Wt-around 210. Takes torsemide 20 mg once or twice a day.   Takes gabapentin once per day for neuropathic pain.   Lab Results   Component Value Date    A1C 5.6 03/13/2022         Lab Results   Component Value Date    TSH 1.214 10/22/2022         Problem List:  Patient Active Problem List   Diagnosis    Spinal stenosis of lumbar region  Thoracic or lumbosacral neuritis or radiculitis    Lumbosacral spondylosis    COPD (chronic obstructive pulmonary disease) (CMS-HCC)    Sleep apnea in adult    Essential hypertension    Type 2 diabetes mellitus with stage 4 chronic kidney disease, with long-term current use of insulin (CMS-HCC)    Chronic cystitis    Incomplete bladder emptying    Peripheral neuropathy    Chronic respiratory failure with hypoxia (CMS-HCC)    Anemia    Persistent atrial fibrillation (CMS-HCC)    Anemia in chronic kidney disease    Chronic kidney disease ([name]), stage IV (severe) (CMS-HCC)    Pulmonary hypertension (CMS-HCC)    Acute on chronic diastolic congestive heart failure (CMS-HCC)    Chronic prescription opiate use    Dyspnea on exertion    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)    Cardiac amyloidosis (CMS-HCC)    Recurrent cold sores    Dyspepsia    Iron deficiency anemia due to chronic blood loss    Neuropathy       Medications:  Reviewed in EPIC    Social history:   Lives with daughter Mardi Mainland    Physical Exam:   Vital Signs:  BP Readings from Last 3 Encounters:   10/22/22 119/56   10/22/22 130/68   10/15/22 111/54      Wt Readings from Last 3 Encounters:   10/28/22 97.1 kg (214 lb)   10/22/22 97.5 kg (215 lb)   10/22/22 97.5 kg (215 lb)      General appearance -   Sounds well on the phone  No breathlessness    Records review  Lab Results   Component Value Date    A1C 5.6 03/13/2022        Health Maintenance Due   Topic Date Due    DEXA Scan  Never done    Retinal Eye Exam  Never done    DTaP/Tdap/Td Vaccines (1 - Tdap) Never done    Medicare Annual Wellness Visit (AWV)  11/25/2018    Foot Exam  02/04/2022    COVID-19 Vaccine (5 - Pfizer series) 03/14/2022    Hemoglobin A1c  09/13/2022      The ASCVD Risk score (Arnett DK, et al., 2019) failed to calculate.     Medication adherence and barriers to the treatment plan have been addressed. Opportunities to optimize healthy behaviors have been discussed. Patient / caregiver voiced understanding.      The patient reports they are physically located in [place] and is currently: at home. I conducted a phone visit.  I spent 6 minutes on the phone call with the patient on the date of service .

## 2022-10-28 NOTE — Unmapped (Signed)
[facility]     Type of visit: telephone    Are you located in [place]? (for virtual visits only) Yes    Reason for visit: Follow up    Questions / Concerns that need to be addressed: None    PTHomeBP     Diabetes:  Regularly checking blood sugars?: no  If yes, when? Complete log for past 7 days  Date Before Breakfast After Breakfast Before Lunch After Lunch Before Dinner After Dinner Before Bed    12/28/21  105                                                                                                                             HCDM reviewed and updated in Epic:    We are working to make sure all of our patients??? wishes are updated in Epic and part of that is documenting a Environmental health practitioner for each patient  A Health Care Decision Maker is someone you choose who can make health care decisions for you if you are not able - who would you most want to do this for you????  is already up to date.    HCDM (patient stated preference): Devota Pace - Daughter - [phone]    BPAs completed:  N/A     COVID-19 Vaccine Summary  Which COVID-19 Vaccine was administered  Pfizer  Type:  Dates Given:  11/14/2021     Date last COVID Positive Test:   06/03/2021             If no: Are you interested in scheduling? Declines vaccine    Immunization History   Administered Date(s) Administered    COVID-19 VAC,BIVALENT(97YR UP),PFIZER 11/14/2021    COVID-19 VACC,MRNA,(PFIZER)(PF) 01/19/2020, 02/09/2020, 12/12/2020    INFLUENZA QUAD ADJUVANTED 44YR UP(FLUAD) 09/17/2021    INFLUENZA QUAD HIGH DOSE 44YRS+(FLUZONE) 09/28/2016    Influenza Vaccine Quad (IIV4 PF) 40mo+ injectable 10/16/2015, 09/08/2019, 08/30/2020    Influenza Virus Vaccine, unspecified formulation 09/27/2014, 09/23/2017, 09/21/2018    PNEUMOCOCCAL POLYSACCHARIDE 23-VALENT 09/28/2016    Pneumococcal Conjugate 13-Valent 08/22/2019    SHINGRIX-ZOSTER VACCINE (HZV), RECOMBINANT,SUB-UNIT,ADJUVANTED IM 04/04/2021, 09/17/2021 __________________________________________________________________________________________    SCREENINGS COMPLETED IN FLOWSHEETS    HARK Screening       AUDIT       PHQ2       PHQ9          P4 Suicidality Screener                GAD7       COPD Assessment       Falls Risk

## 2022-10-29 ENCOUNTER — Ambulatory Visit: Admit: 2022-10-29 | Discharge: 2022-10-30 | Payer: MEDICARE

## 2022-10-29 LAB — HEMOGLOBIN A1C
ESTIMATED AVERAGE GLUCOSE: 117 mg/dL
HEMOGLOBIN A1C: 5.7 % — ABNORMAL HIGH (ref 4.8–5.6)

## 2022-10-29 NOTE — Unmapped (Signed)
Orthopaedic Foot and Ankle Division  Encounter Provider: Ancil Linsey, NP  Date of Service: 10/29/2022 Last encounter Orthopaedics: Visit date not found   Last encounter this provider: Visit date not found      Notes:         Primary Care Provider: Artelia Laroche, MD  Referring Provider: Referred Self  No diagnosis found. Orthopaedic notes: Retired. S/p R.L4-5 discx '95. COPD. OSA/CPAP. BMI TAH/BSO at age 81, poor bone quality. BMI 38.  MRI 12/2014 with L5-S1 spondylolisthesis with R.L4 and L5 foram stenosis, left L5 foram stenosis.  Risks of surgery outweigh benefits.       Physical Function CAT Score: (not recorded)  Pain Interference CAT Score: (not recorded)  Depression CAT Score: (not recorded)  Sleep CAT Score: (not recorded)  [email or web address]     Barbara Huber is a 81 y.o. female   ASSESSMENT   R ankle pain x 2 weeks without injury, neuropathy        PLAN:   Concern for possible Charcot.  Discussed boot. Due to unstable gait, have decided to hold off for now. Recommended staying off of R foot as much as possible with elevation and use of gentle ace wrap, closely monitoring skin. F/u 2 weeks with R ankle arthritis series prior to exam.    Requested Prescriptions      No prescriptions requested or ordered in this encounter      No orders of the defined types were placed in this encounter.      History:  Reason for visit: R ankle pain  HPI:  81 y.o. female who reports 2 weeks of R foot/ankle pain. No injury or change in activity level prior to the onset of symptoms. No prior treatment. History of diabetes with neuropathy.       Medical History Past Medical History:   Diagnosis Date   ??? Acute kidney injury superimposed on chronic kidney disease (CMS-HCC) 10/11/2020   ??? Acute on chronic diastolic (congestive) heart failure (CMS-HCC) 08/23/2020   ??? AKI (acute kidney injury) (CMS-HCC) 04/14/2015    Lab Results  Component  Value  Date CREATININE  1.90 (H)  06/12/2021  ??  Had a bump in her creatinine when she was taking her diuretics every day.  She is currently taking 40 mg daily of torsemide and 50 mg of spironolactone.  Her volume status is fragile.  Previously when she stopped her diuretic she becomes short of breath.  Plan: We will check her BMP today.  We will likely have to go to 40   ??? Arthritis    ??? Calculus of kidney    ??? Calculus of ureter    ??? CHF (congestive heart failure) (CMS-HCC)    ??? Chronic atrial fibrillation (CMS-HCC) 07/20/2019   ??? COPD (chronic obstructive pulmonary disease) (CMS-HCC)    ??? Diabetes (CMS-HCC)    ??? Gangrenous cholecystitis 10/11/2020   ??? Generalized edema  06/17/2021   ??? GERD (gastroesophageal reflux disease)    ??? Hydronephrosis    ??? Hypertension    ??? Hyponatremia 10/11/2020   ??? Intermediate coronary syndrome (CMS-HCC) 03/13/2014   ??? Lower extremity edema 09/28/2020   ??? Lumbar stenosis    ??? Microscopic hematuria    ??? Nausea alone    ??? Nephrolithiasis 04/17/2016   ??? Neuropathy    ??? Nocturia    ??? Nocturia 07/01/2017   ??? Other chronic cystitis    ??? Persistent fatigue after COVID-19 06/03/2021    Patient with some  fatigue.  See plans for anemia, AKI  We are also tapering her gabapentin.  Currently on 300 mg 3 times daily.  We will decrease it to twice daily, and then nightly.  She states she is not having any recurrence of her pain.   ??? Pulmonary hypertension (CMS-HCC)    ??? Renal colic    ??? Shortness of breath 04/28/2020   ??? Sleep apnea    ??? Unstable angina pectoris (CMS-HCC) 03/13/2014      Surgical History Past Surgical History:   Procedure Laterality Date   ??? BACK SURGERY  1995   ??? CARPAL TUNNEL RELEASE Left 2014   ??? HYSTERECTOMY  1971   ??? IR INSERT CHOLECYSTOSMY TUBE PERCUTANEOUS  10/02/2020    IR INSERT CHOLECYSTOSMY TUBE PERCUTANEOUS 10/02/2020 Braulio Conte, MD IMG [facility]IR [facility]   ??? LUMBAR DISC SURGERY     ??? PR REMOVAL GALLBLADDER N/A 10/06/2020    Procedure: CHOLECYSTECTOMY;  Surgeon: Katherina Mires, MD;  Location: MAIN [facility];  Service: Trauma   ??? PR RIGHT HEART CATH O2 SATURATION & CARDIAC OUTPUT N/A 09/30/2020    Procedure: Right Heart Catheterization;  Surgeon: Neal Dy, MD;  Location: [facility] CATH;  Service: Cardiology   ??? PR RIGHT HEART CATH O2 SATURATION & CARDIAC OUTPUT N/A 01/09/2021    Procedure: Right Heart Catheterization;  Surgeon: Lesle Reek, MD;  Location: [facility] CATH;  Service: Cardiology      Allergies Nitrofurantoin and Lipitor [atorvastatin]   Medications She has a current medication list which includes the following prescription(s): acetaminophen, albuterol, amiodarone, accu-chek guide test strips, blood-glucose meter, cranberry, estradiol, fluticasone propion-salmeterol, fosfomycin, gabapentin, insulin glargine, lancets, metolazone, montelukast, narcan, NON FORMULARY, oxycontin, oxygen-air delivery systems, pantoprazole, pen needle, diabetic, rosuvastatin, sertraline, spironolactone, vyndamax, spiriva respimat, torsemide, and shingrix (pf).   Family History Her family history includes Cancer in her father; Hypertension in her mother.   Social History She reports that she quit smoking about 4 years ago. Her smoking use included cigarettes. She smoked an average of .33 packs per day. She has never used smokeless tobacco. She reports that she does not drink alcohol and does not use drugs.Home address:[address] [place] [address]  Occupation:         Occupational History   ??? Occupation: retired     Chief Executive Officer History     Social History Narrative    Previously worked in Designer, fashion/clothing, retired.  Daughter is Economist who works at [employer].  Her niece is Selena Batten, [employer] Anesthesia Tech.  Lives in [place] with daughter and son-in-law.            Exam:  There were no encounter diagnoses.   Estimated body mass index is 31.6 kg/m?? as calculated from the following:    Height as of 10/28/22: 175.3 cm (5' 9).    Weight as of 10/28/22: 97.1 kg (214 lb). Musculoskeletal   ??? TTP anterior tibiotalar joint. Mildly tender midfoot. Increased warmth but no erythema. Mild edema is present.        Standing alignment moderate planovalgus    Neurologic Sensation to light touch distally diminished   Skin Benign, no lesions        Test Results  There were no encounter diagnoses.  Lab Results   Component Value Date    A1C 5.6 03/13/2022       No results found for: VITD    Imaging  No orders of the defined types were placed  in this encounter.    Previous imaging was personally reviewed and interpreted by the encounter provider today..  no fracture. Degenerative change noted.        DME ORDER:  Dx:  ,

## 2022-11-05 NOTE — Unmapped (Signed)
[facility] Refill Coordination Note    Specialty Medication(s) to be Shipped:   General Specialty: Vyndamax    Other medication(s) to be shipped: No additional medications requested for fill at this time     Barbara Huber, DOB: 09/13/1941  Phone: [phone] (home)       All above HIPAA information was verified with patient.     Was a Nurse, learning disability used for this call? No    Completed refill call assessment today to schedule patient's medication shipment from the [facility] [phone]).  All relevant notes have been reviewed.     Specialty medication(s) and dose(s) confirmed: Regimen is correct and unchanged.   Changes to medications: Barbara Huber reports no changes at this time.  Changes to insurance: No  New side effects reported not previously addressed with a pharmacist or physician: None reported  Questions for the pharmacist: No    Confirmed patient received a Conservation officer, historic buildings and a Surveyor, mining with first shipment. The patient will receive a drug information handout for each medication shipped and additional FDA Medication Guides as required.       DISEASE/MEDICATION-SPECIFIC INFORMATION        N/A    SPECIALTY MEDICATION ADHERENCE     Medication Adherence    Patient reported X missed doses in the last month: 0  Specialty Medication: Vyndamax 61mg   Informant: patient                                Were doses missed due to medication being on hold? No    Vyndamax 61 mg: 12 days of medicine on hand       REFERRAL TO PHARMACIST     Referral to the pharmacist: Not needed      [facility]     Shipping address confirmed in Epic.     Delivery Scheduled: Yes, Expected medication delivery date: 11/12/22.     Medication will be delivered via Next Day Courier to the prescription address in Epic WAM.    Camillo Flaming, PharmD   [facility] Specialty Pharmacist

## 2022-11-11 MED FILL — VYNDAMAX 61 MG CAPSULE: ORAL | 30 days supply | Qty: 30 | Fill #10

## 2022-12-04 ENCOUNTER — Ambulatory Visit: Admit: 2022-12-04 | Discharge: 2022-12-04 | Payer: MEDICARE

## 2022-12-04 ENCOUNTER — Ambulatory Visit
Admit: 2022-12-04 | Discharge: 2022-12-04 | Payer: MEDICARE | Attending: Cardiovascular Disease | Primary: Cardiovascular Disease

## 2022-12-04 DIAGNOSIS — I5032 Chronic diastolic (congestive) heart failure: Principal | ICD-10-CM

## 2022-12-04 LAB — COMPREHENSIVE METABOLIC PANEL
ALBUMIN: 3.6 g/dL (ref 3.4–5.0)
ALKALINE PHOSPHATASE: 101 U/L (ref 46–116)
ALT (SGPT): <7 U/L — ABNORMAL LOW (ref 10–49)
ANION GAP: 11 mmol/L (ref 5–14)
AST (SGOT): 13 U/L (ref <=34)
BILIRUBIN TOTAL: 0.5 mg/dL (ref 0.3–1.2)
BLOOD UREA NITROGEN: 77 mg/dL — ABNORMAL HIGH (ref 9–23)
BUN / CREAT RATIO: 25
CALCIUM: 9.4 mg/dL (ref 8.7–10.4)
CHLORIDE: 100 mmol/L (ref 98–107)
CO2: 26.3 mmol/L (ref 20.0–31.0)
CREATININE: 3.02 mg/dL — ABNORMAL HIGH
EGFR CKD-EPI (2021) FEMALE: 15 mL/min/{1.73_m2} — ABNORMAL LOW (ref >=60)
GLUCOSE RANDOM: 238 mg/dL — ABNORMAL HIGH (ref 70–179)
POTASSIUM: 4.1 mmol/L (ref 3.4–4.8)
PROTEIN TOTAL: 7.8 g/dL (ref 5.7–8.2)
SODIUM: 137 mmol/L (ref 135–145)

## 2022-12-04 NOTE — Unmapped (Signed)
Today,    We discussed that if you do not lose the couple of pounds of weight you will contact us and we will arrange for an [name] clinic visit.     MEDICATIONS:  NO medication changes today.    Call if you have questions about your medications.    LABS:  We will call you if your labs need attention.    NEXT APPOINTMENT:  Return to clinic in 2 months with Derwood Kaplan NP      In general, to take care of your heart failure:  -Limit your fluid intake to 2 Liters (half-gallon) per day.    -Limit your salt intake to ideally 2-3 grams (2000-3000 mg) per day.  -Weigh yourself daily and record, and bring that weight diary to your next appointment.  (Weight gain of 2-3 pounds in 1 day typically means fluid weight.)    The medications for your heart are to help your heart and help you live longer.    Please contact us before stopping any of your heart medications.    Call the clinic at [phone] with questions.  Our clinic fax number is [phone].  If you need to reschedule future appointments, please call [phone] or [phone]  My office number (c/o De Burrs RN) is [phone] if you need further assistance.  After office hours, if you have urgent questions/problems, contact the on-call cardiologist through the hospital operator: [phone].    Please do not send a MyChart message for potentially life-threatening symptoms.  Please call 911 for a true medical emergency.    To learn more about heart failure, please read [facility] Learning to Live with Heart Failure - Available online at:  [email or web address] - open the window for Medical Management and click the link Living with Heart Failure   (Can search [facility] heart failure on the web to find the link.)

## 2022-12-04 NOTE — Unmapped (Signed)
[facility] HF Amyloid Clinic Note    Referring Provider: Referring, None Per Patient  [address] [place],  [place] [address]   Primary Provider: Artelia Laroche, MD  [address]  [place] [address]   Other Providers:  Dr Luretha Murphy    Reason for Visit:  Barbara Huber is a 81 y.o. female being seen for routine visit and continued care of cardiac amyloidosis .    Assessment & Plan:  1. 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 SPEP/UPEP/free light chains without concern. 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 Daughter is informed.   - DC'd Jardiance 10mg  daily due to recurrent UTIs  - Weight slightly above baseline today at 219 lb. Has taken an extra torsemide dose, will continue to monitor and will contact us if she does not return to baseline of 213-214 lb within the next few days. Continue torsemide 40mg  daily with extra 20mg  PRN.   - Continue Tafamidis 61 mg daily   - Continue spironolactone 25 mg daily   - She is not interested in CardioMEMS at this time    2. Persistent atrial fibrillation  - CHA2DS2-VASc score = 5 (1-gender, 2-Age, 1-DM, 1-HTN)  - We stopped Eliquis given recurrent anemia, shared decision making w/ pt and daughter. She is not interested in [place] (or any procedures) at this time  - She had been in afib since at least 07/2020; now s/p cardioversion 01/10/21 and continues to be in NSR since  - On amiodarone 200mg  daily - increased it back herself after seeing no improvement in tremors/jerking with decreased dose  - amiodarone monitoring - last PFTs in 04/2021. TSH and LFTs today normal.    3. [name]  - Cr staying around 2.3-2.5; 2.14 today    4. COPD  - home O2 3L [place] - followed by pulm  - on Advair and Spiriva    5. Iron deficiency anemia  - Finished IV Iron - ferritin 178. No longer anemic, Hgb 13.2  Lab Results   Component Value Date    FERRITIN 178.0 08/19/2022     6. Anxiety  - Tolerating Zoloft 25mg  with improved moved/anxiety    Follow-up:  Return in about 2 months (around 02/04/2023) for Recheck.     Carin Hock, MD PhD  Advanced Heart Failure, Mechanical Circulatory Support and Transplant Cardiology    History of Present Illness:  Barbara Huber is a 81 y.o. female with PMHx of COPD on 3L home [place], CHF, [name], [name], [name] She was initially admitted with decompensated [name] in 2021 in setting of  Septic Shock d/t Gangrenous Cholecystitis s/p cholecystectomy. She underwent further evaluation with PYP scan which showed TTR amyloid. She has since been followed in clinic for management of her [name]. In 2022, had numerous admissions for weakness/urosepsis, shortness of breath/orthopnea/weight gain, lightheadedness/SOB/Blurry vision found to be anemic.    Summary of most recent visits:  - 01/16/22 [facility]): Feeling well from a cardiac standpoint. Started Zoloft for anxiety  - 02/13/22 [facility]): Weight 210-211. Feeling well. Taking extra torsemide 20mg  about every other day, which was stopped for elevated Cr.   -  03/13/22 ([name]): Weight 201-216, took metolazone PRN, weight stabilized at 210lbs. Considering cardioMEMS.   - 05/13/22 ([name]): Had 3 iron infusions. Doing well w/ volume, weight 211-212lbs. Cooking, staying active around house.   - 05/21/22 ([name]): CP/SOB. Weight 217 from 213. She took metolazone x1 dose.   - 05/22/22 ([name]): Ed follow up. Back down to 213lbs. Still having CP w/ certain movement.   - 06/05/22 ([name]): Weight 214-216lb, should be 212lb.   - 06/09/22, 06/11/22, 06/22/22 ([name]): Weight down from 217 to 213lb, 211lb at home. Feeling well by 6/26.   - 07/15/22 ([name]): Switched inhaler back to Advair/Spiriva with improvement in breathing and O2 sat. Weight 207lbs, feeling more like herself.   - 08/19/22 ([name]): Feeling well. Weight 208-210lbs.   - 10/19/22 ([name]): Feeling well. Weight has been 210-214lbs.    Interval history  Today Barbara Huber presents to the clinic for a follow up visit. Overall she is feeling very good from a cardiac standpoint. She reports a bad catch in the hip. She thinks it may be because she is leaning on that side against the counter while cooking. Her legs are not swollen but she went up 4 pounds this week. Her belly does not feel bloated. She is still taking 40 mg of torsemide and took an extra 20 mg dose yesterday morning. She did not pee a lot with the extra dose. Thinks she may have consumed extra sodium recently. She is having regular bowel movements. Encouraged her to check weight tomorrow and if it still doesn't come down she can taken an extra torsemide again. If needed we will arrange for an [name] clinic visit. She reports she is sleeping okay, not short of breath laying down. No recent UTIs that may be interfering with urination. Has been urinating today.     Labs today.     Cardiovascular History & Procedures:    Cath / PCI:  none    CV Surgery:   none    EP Procedures and Devices:  none    Non-Invasive Evaluation(s):    Echo:  12/09/21  Summary    1. The left ventricle is normal in size with severely increased wall  thickness.    2. The left ventricular systolic function is hyperdynamic, LVEF is visually  estimated at >70%.    3. There is grade III diastolic dysfunction (severely elevated filling  pressure).    4. The left atrium is moderately to severely dilated in size.    5. The right ventricle is mildly dilated in size, with normal systolic  function.    6. The right atrium is mildly dilated  in size.    7. IVC size and inspiratory change suggest elevated right atrial pressure.  (10-20 mmHg).    04/24/21   1. The left ventricle is normal in size with mildly increased wall  thickness.    2. The left ventricular systolic function is normal, LVEF is visually  estimated at > 55%.    3. There is grade II diastolic dysfunction (elevated filling pressure).    4. Mitral annular calcification is present (mild).    5. The left atrium is mildly dilated in size.    6. The right ventricle is mildly dilated in size, with low normal systolic  function.    7. IVC size and inspiratory change suggest elevated right atrial pressure.  (10-20 mmHg).    09/30/20  Summary    1. Limited study to assess systolic function.  2. IVC size and inspiratory change suggest normal right atrial pressure.  (0-5 mmHg).    3. The left ventricle is normal in size with mildly to moderately increased  wall thickness.    4. The left ventricular systolic function is normal with no obvious wall  motion abnormalities, LVEF is visually estimated at > 55%.    5. Mitral annular calcification is present.    6. The left atrium is mildly dilated in size.    7. The right ventricle is upper normal in size, with reduced systolic  Function.    Cardiac CT/MRI/Nuclear Tests:   None    6 Minute Walk:  None    Cardiopulmonary Stress Tests:   None               Other Past Medical History:  See below for the complete EPIC list of past medical and surgical history.      Allergies:  Nitrofurantoin and Lipitor [atorvastatin]    Current Medications:  Current Outpatient Medications   Medication Sig Dispense Refill    acetaminophen (TYLENOL) 500 MG tablet Take 2 tablets (1,000 mg total) by mouth daily as needed for pain.      albuterol HFA 90 mcg/actuation inhaler Inhale 2 puffs every eight (8) hours as needed for wheezing.      amiodarone (PACERONE) 200 MG tablet Take 1 tablet (200 mg total) by mouth daily. 90 tablet 3    blood sugar diagnostic (ACCU-CHEK GUIDE TEST STRIPS) Strp Use to check blood sugar daily as instructed by clinic. Dx E11.9 For daily testing 100 strip 3    blood-glucose meter kit Use as instructed Daily testing 1 each 0    cranberry 500 mg cap Take 500 mg by mouth daily with evening meal.      estradioL (ESTRACE) 0.01 % (0.1 mg/gram) vaginal cream Insert 2 g into the vagina Two (2) times a week. 42.5 g 3    fluticasone propion-salmeteroL (ADVAIR HFA) 115-21 mcg/actuation inhaler Inhale 2 puffs Two (2) times a day. 12 g 11    fosfomycin (MONUROL) 3 gram Pack Take 3 g by mouth once a week. 36 g 3    gabapentin (NEURONTIN) 300 MG capsule Take 1 capsule (300 mg total) by mouth in the morning. 90 capsule 3    insulin glargine (BASAGLAR, LANTUS) 100 unit/mL (3 mL) injection pen Inject 0.04 mL (4 Units total) under the skin nightly. 15 mL 3    lancets Misc 1 each by Miscellaneous route daily. Accu Check 100 each 6    montelukast (SINGULAIR) 10 mg tablet TAKE 1 TABLET(10 MG) BY MOUTH IN THE MORNING 90 tablet 2    NON FORMULARY APPLY FROM NECK DOWN TWICE A DAY      OXYCONTIN 10 mg TR12 12 hr crush resistant ER/CR tablet Take 1 tablet (10 mg total) by mouth every twelve (12) hours.      OXYGEN-AIR DELIVERY SYSTEMS MISC 5 L by Miscellaneous route. Currently using 3  L/min via [place]      pantoprazole (PROTONIX) 20 MG tablet Take 1 tablet (20 mg total) by mouth daily. 60 tablet 5    pen needle, diabetic (BD ULTRA-FINE SHORT PEN NEEDLE) 31 gauge x 5/16 (8 mm) Ndle Give 4 units of insulin each night 100 each 1    rosuvastatin (CRESTOR) 5 MG tablet Take 1 tablet (5 mg total) by mouth every other day. 15 tablet 11    sertraline (ZOLOFT) 25 MG tablet Take 1 tablet (25 mg total)  by mouth daily. 90 tablet 3    spironolactone (ALDACTONE) 25 MG tablet Take 1 tablet (25 mg total) by mouth daily. 90 tablet 3    tafamidis (VYNDAMAX) 61 mg cap Take 1 capsule (61 mg) by mouth daily. 30 capsule 11    tiotropium bromide (SPIRIVA RESPIMAT) 2.5 mcg/actuation inhalation mist Inhale 2 puffs daily. 4 g 11    torsemide (DEMADEX) 20 MG tablet Take 3 tablets (60 mg total) by mouth daily. 270 tablet 3    metOLazone (ZAROXOLYN) 5 MG tablet Take 1 tablet (5 mg total) by mouth daily as needed (when instructed by cardiology clinic). 30 tablet 1    NARCAN 4 mg/actuation nasal spray 1 spray into alternating nostrils once as needed (opioid overdose). PRN - Emergency use. (Patient not taking: Reported on 12/04/2022)      varicella-zoster [id number], PF, (SHINGRIX, PF,) 50 mcg/0.5 mL SusR injection Inject 0.5 mL into the muscle. (Patient not taking: Reported on 12/04/2022) 0.5 mL 1     No current facility-administered medications for this visit.       Family History:  The patient's family history includes Cancer in her father; Hypertension in her mother.    Social history:  She  reports that she quit smoking about 4 years ago. Her smoking use included cigarettes. She has never used smokeless tobacco. She reports that she does not drink alcohol and does not use drugs.    Review of Systems:  As per HPI.  Rest of the review of ten systems is negative or unremarkable except as stated above.    Physical Exam:  VITAL SIGNS:   Vitals:    12/04/22 1155   BP:    Pulse:    SpO2: 90%            Wt Readings from Last 3 Encounters:   12/04/22 99.3 kg (219 lb)   10/28/22 97.1 kg (214 lb)   10/22/22 97.5 kg (215 lb)      Today's Body mass index is 32.34 kg/m??.   Height: 175.3 cm (5' 9)  CONSTITUTIONAL: well-appearing in no acute distress  EYES: Conjunctivae and sclerae clear and anicteric.  ENT: Benign.   CARDIOVASCULAR: JVP not seen above the clavicle with HOB at 90 degrees. Rate and rhythm are regular.  There is no lifts or heaves.  Normal S1, S2. There is no murmur, gallops or rubs.  Radial and pedal pulses are 2+, bilaterally.   There is no edema to ankles, bilaterally.   RESPIRATORY: Decreased breath sounds at bases.  There are no wheezes.  GASTROINTESTINAL: Soft, non-tender, with audible bowel sounds. Abdomen nondistended.  Liver is nonpalpable.  SKIN: No rashes, ecchymosis or petechiae.  Warm, well perfused.   MUSCULOSKELETAL:  no joint swelling   NEURO/PSYCH: Appropriate mood and affect. Alert and oriented to person, place, and time. No gross motor or sensory deficits evident.    Pertinent Laboratory Studies:   Office Visit on 12/04/2022   Component Date Value Ref Range Status    Sodium 12/04/2022 137  135 - 145 mmol/L Final    Potassium 12/04/2022 4.1  3.4 - 4.8 mmol/L Final    Chloride 12/04/2022 100  98 - 107 mmol/L Final    CO2 12/04/2022 26.3  20.0 - 31.0 mmol/L Final    Anion Gap 12/04/2022 11  5 - 14 mmol/L Final    BUN 12/04/2022 77 (H)  9 - 23 mg/dL Final    Creatinine 16/09/9603 3.02 (H)  0.55 - 1.02 mg/dL Final  BUN/Creatinine Ratio 12/04/2022 25   Final    eGFR [name]-EPI (2021) Female 12/04/2022 15 (L)  >=60 mL/min/1.40m2 Final    Glucose 12/04/2022 238 (H)  70 - 179 mg/dL Final    Calcium 60/45/4098 9.4  8.7 - 10.4 mg/dL Final    Albumin 11/91/4782 3.6  3.4 - 5.0 g/dL Final    Total Protein 12/04/2022 7.8  5.7 - 8.2 g/dL Final    Total Bilirubin 12/04/2022 0.5  0.3 - 1.2 mg/dL Final    AST 95/62/1308 13  <=34 U/L Final    ALT 12/04/2022 <7 (L)  10 - 49 U/L Final    Alkaline Phosphatase 12/04/2022 101  46 - 116 U/L Final    PRO-BNP 12/04/2022 3,254.0 (H)  0.0 - 450.0 pg/mL Final   Appointment on 10/29/2022   Component Date Value Ref Range Status    Hemoglobin A1C 10/29/2022 5.7 (H)  4.8 - 5.6 % Final    Estimated Average Glucose 10/29/2022 117  mg/dL Final   Office Visit on 10/22/2022   Component Date Value Ref Range Status    Spec Gravity/POC 10/22/2022 <=1.005  1.003 - 1.030 Final    PH/POC 10/22/2022 5.0  5.0 - 9.0 Final    Leuk Esterase/POC 10/22/2022 Trace (A)  Negative Final    Nitrite/POC 10/22/2022 Negative  Negative Final    Protein/POC 10/22/2022 Negative  Negative Final    UA Glucose/POC 10/22/2022 Negative  Negative Final    Ketones, POC 10/22/2022 Negative  Negative Final    Bilirubin/POC 10/22/2022 Negative  Negative Final    Blood/POC 10/22/2022 Trace-intact (A)  Negative Final    Urobilinogen/POC 10/22/2022 0.2  0.2 - 1.0 mg/dL Final    Urine Culture, Comprehensive 10/22/2022 Mixed Gram Positive/Gram Negative Organisms Isolated (A)   Final   Office Visit on 10/22/2022   Component Date Value Ref Range Status    Sodium 10/22/2022 141  135 - 145 mmol/L Final    Potassium 10/22/2022 4.0  3.4 - 4.8 mmol/L Final    Chloride 10/22/2022 105  98 - 107 mmol/L Final    CO2 10/22/2022 27.5  20.0 - 31.0 mmol/L Final    Anion Gap 10/22/2022 9  5 - 14 mmol/L Final    BUN 10/22/2022 71 (H)  9 - 23 mg/dL Final    Creatinine 65/78/4696 2.14 (H)  0.55 - 1.02 mg/dL Final    BUN/Creatinine Ratio 10/22/2022 33   Final    eGFR [name]-EPI (2021) Female 10/22/2022 23 (L)  >=60 mL/min/1.25m2 Final    Glucose 10/22/2022 157  70 - 179 mg/dL Final    Calcium 29/52/8413 9.5  8.7 - 10.4 mg/dL Final    Albumin 24/40/1027 3.8  3.4 - 5.0 g/dL Final    Total Protein 10/22/2022 7.9  5.7 - 8.2 g/dL Final    Total Bilirubin 10/22/2022 0.5  0.3 - 1.2 mg/dL Final    AST 25/36/6440 12  <=34 U/L Final    ALT 10/22/2022 <7 (L)  10 - 49 U/L Final    Alkaline Phosphatase 10/22/2022 99  46 - 116 U/L Final    BNP 10/22/2022 211.14 (H)  <=100 pg/mL Final    Magnesium 10/22/2022 2.7 (H)  1.6 - 2.6 mg/dL Final    WBC 34/74/2595 8.0  3.6 - 11.2 10*9/L Final    RBC 10/22/2022 4.35  3.95 - 5.13 10*12/L Final    HGB 10/22/2022 13.2  11.3 - 14.9 g/dL Final    HCT 63/87/5643 41.0  34.0 - 44.0 % Final  MCV 10/22/2022 94.5  77.6 - 95.7 fL Final    MCH 10/22/2022 30.4  25.9 - 32.4 pg Final    MCHC 10/22/2022 32.2  32.0 - 36.0 g/dL Final    RDW 14/78/2956 13.1  12.2 - 15.2 % Final    MPV 10/22/2022 9.0  6.8 - 10.7 fL Final    Platelet 10/22/2022 203  150 - 450 10*9/L Final    TSH 10/22/2022 1.214  0.550 - 4.780 uIU/mL Final   Office Visit on 08/19/2022   Component Date Value Ref Range Status    BNP 08/19/2022 167.03 (H)  <=100 pg/mL Final    Sodium 08/19/2022 140  135 - 145 mmol/L Final    Potassium 08/19/2022 4.4  3.4 - 4.8 mmol/L Final    Chloride 08/19/2022 105  98 - 107 mmol/L Final    CO2 08/19/2022 26.3  20.0 - 31.0 mmol/L Final    Anion Gap 08/19/2022 9  5 - 14 mmol/L Final    BUN 08/19/2022 54 (H)  9 - 23 mg/dL Final    Creatinine 21/30/8657 2.35 (H)  0.60 - 0.80 mg/dL Final    BUN/Creatinine Ratio 08/19/2022 23   Final    eGFR [name]-EPI (2021) Female 08/19/2022 20 (L)  >=60 mL/min/1.37m2 Final    Glucose 08/19/2022 109  70 - 179 mg/dL Final    Calcium 84/69/6295 9.1  8.7 - 10.4 mg/dL Final    Magnesium 28/41/3244 2.5  1.6 - 2.6 mg/dL Final    Ferritin 12/30/7251 178.0  7.3 - 270.7 ng/mL Final    WBC 08/19/2022 7.9  3.6 - 11.2 10*9/L Final    RBC 08/19/2022 4.58  3.95 - 5.13 10*12/L Final    HGB 08/19/2022 13.6  11.3 - 14.9 g/dL Final    HCT 66/44/0347 42.0  34.0 - 44.0 % Final    MCV 08/19/2022 91.5  77.6 - 95.7 fL Final    MCH 08/19/2022 29.7  25.9 - 32.4 pg Final    MCHC 08/19/2022 32.5  32.0 - 36.0 g/dL Final    RDW 42/59/5638 15.3 (H)  12.2 - 15.2 % Final    MPV 08/19/2022 9.2  6.8 - 10.7 fL Final    Platelet 08/19/2022 163  150 - 450 10*9/L Final    Iron 08/19/2022 74  50 - 170 ug/dL Final    TIBC 75/64/3329 243 (L)  250 - 425 ug/dL Final    Iron Saturation (%) 08/19/2022 30  20 - 55 % Final       Lab Results   Component Value Date    PRO-BNP 3,254.0 (H) 12/04/2022    PRO-BNP 3,420.0 (H) 07/03/2021    PRO-BNP 905.0 (H) 03/21/2021    Creatinine 3.02 (H) 12/04/2022    Creatinine 2.14 (H) 10/22/2022    Creatinine 0.79 01/06/2011    BUN 77 (H) 12/04/2022    BUN 71 (H) 10/22/2022    BUN 20 01/06/2011    Sodium 137 12/04/2022    Sodium 140 01/06/2011    Potassium 4.1 12/04/2022    Potassium 4.1 01/06/2011    CO2 26.3 12/04/2022    CO2 30 01/06/2011    Magnesium 2.7 (H) 10/22/2022    Magnesium 2.0 01/06/2011    Total Bilirubin 0.5 12/04/2022    INR 2.15 07/01/2021    INR 1.0 01/06/2011       Lab Results   Component Value Date    Digoxin Level 0.9 10/01/2020       Lab Results   Component Value Date  TSH 1.214 10/22/2022    Cholesterol 95 12/06/2021    Triglycerides 66 12/06/2021    HDL 42 12/06/2021    Non-HDL Cholesterol 53 (L) 12/06/2021    LDL Calculated 40 12/06/2021       Lab Results Component Value Date    WBC 8.0 10/22/2022    WBC 12.5 (H) 01/06/2011    HGB 13.2 10/22/2022    HGB 12.9 01/06/2011    HCT 41.0 10/22/2022    HCT 40.8 01/06/2011    Platelet 203 10/22/2022    Platelet 260 01/06/2011       Pertinent Test Results from Today:  None    Other pertinent records were reviewed.    The following are further history from the patient's EPIC record for reference:     Past Medical History:   Diagnosis Date    Acute kidney injury superimposed on chronic kidney disease (CMS-HCC) 10/11/2020    Acute on chronic diastolic (congestive) heart failure (CMS-HCC) 08/23/2020    AKI (acute kidney injury) (CMS-HCC) 04/14/2015    Lab Results  Component  Value  Date     CREATININE  1.90 (H)  06/12/2021     Had a bump in her creatinine when she was taking her diuretics every day.  She is currently taking 40 mg daily of torsemide and 50 mg of spironolactone.  Her volume status is fragile.  Previously when she stopped her diuretic she becomes short of breath.  Plan: We will check her BMP today.  We will likely have to go to 40    Arthritis     Calculus of kidney     Calculus of ureter     CHF (congestive heart failure) (CMS-HCC)     Chronic atrial fibrillation (CMS-HCC) 07/20/2019    COPD (chronic obstructive pulmonary disease) (CMS-HCC)     Diabetes (CMS-HCC)     Gangrenous cholecystitis 10/11/2020    Generalized edema  06/17/2021    GERD (gastroesophageal reflux disease)     Hydronephrosis     Hypertension     Hyponatremia 10/11/2020    Intermediate coronary syndrome (CMS-HCC) 03/13/2014    Lower extremity edema 09/28/2020    Lumbar stenosis     Microscopic hematuria     Nausea alone     Nephrolithiasis 04/17/2016    Neuropathy     Nocturia     Nocturia 07/01/2017    Other chronic cystitis     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  See plans for anemia, AKI  We are also tapering her gabapentin.  Currently on 300 mg 3 times daily.  We will decrease it to twice daily, and then nightly.  She states she is not having any recurrence of her pain.    Pulmonary hypertension (CMS-HCC)     Renal colic     Shortness of breath 04/28/2020    Sleep apnea     Unstable angina pectoris (CMS-HCC) 03/13/2014       Past Surgical History:   Procedure Laterality Date    BACK SURGERY  1995    CARPAL TUNNEL RELEASE Left 2014    HYSTERECTOMY  1971    IR INSERT CHOLECYSTOSMY TUBE PERCUTANEOUS  10/02/2020    IR INSERT CHOLECYSTOSMY TUBE PERCUTANEOUS 10/02/2020 Braulio Conte, MD IMG VIR [facility]    LUMBAR DISC SURGERY      PR REMOVAL GALLBLADDER N/A 10/06/2020    Procedure: CHOLECYSTECTOMY;  Surgeon: Katherina Mires, MD;  Location: MAIN OR  [facility];  Service: Trauma    PR RIGHT HEART CATH O2 SATURATION & CARDIAC OUTPUT N/A 09/30/2020    Procedure: Right Heart Catheterization;  Surgeon: Neal Dy, MD;  Location: [facility] CATH;  Service: Cardiology    PR RIGHT HEART CATH O2 SATURATION & CARDIAC OUTPUT N/A 01/09/2021    Procedure: Right Heart Catheterization;  Surgeon: Lesle Reek, MD;  Location: [facility] CATH;  Service: Cardiology       Documentation assistance was provided by Binnie Kand, Scribe on December 04, 2022 at 10:38 AM for Carin Hock, MD, PhD.     I have reviewed the documentation provided by the scribe and confirm that it accurately reflects the service I personally performed and the decisions made by me.  Signature: MB  Date: 12/04/2022  Time: 2:11 PM

## 2022-12-07 LAB — PRO-BNP: PRO-BNP: 3254 pg/mL — ABNORMAL HIGH (ref 0.0–450.0)

## 2022-12-10 MED FILL — VYNDAMAX 61 MG CAPSULE: ORAL | 30 days supply | Qty: 30 | Fill #11

## 2022-12-10 NOTE — Unmapped (Signed)
[facility] Refill Coordination Note    Specialty Medication(s) to be Shipped:   General Specialty: Vyndamax    Other medication(s) to be shipped: No additional medications requested for fill at this time     Barbara Huber, DOB: 1941-05-05  Phone: [phone] (home)       All above HIPAA information was verified with patient.     Was a Nurse, learning disability used for this call? No    Completed refill call assessment today to schedule patient's medication shipment from the [facility] [phone]).  All relevant notes have been reviewed.     Specialty medication(s) and dose(s) confirmed: Regimen is correct and unchanged.   Changes to medications: [name] reports no changes at this time.  Changes to insurance: No  New side effects reported not previously addressed with a pharmacist or physician: None reported  Questions for the pharmacist: No    Confirmed patient received a Conservation officer, historic buildings and a Surveyor, mining with first shipment. The patient will receive a drug information handout for each medication shipped and additional FDA Medication Guides as required.       DISEASE/MEDICATION-SPECIFIC INFORMATION        N/A    SPECIALTY MEDICATION ADHERENCE     Medication Adherence    Patient reported X missed doses in the last month: 0  Specialty Medication: Vyndamax 61mg   Informant: patient                                Were doses missed due to medication being on hold? No    Vyndamax 61 mg: ~10 days of medicine on hand       REFERRAL TO PHARMACIST     Referral to the pharmacist: Not needed      [facility]     Shipping address confirmed in Epic.     Delivery Scheduled: Yes, Expected medication delivery date: 12/11/22.     Medication will be delivered via Next Day Courier to the prescription address in Epic WAM.    Camillo Flaming, PharmD   [facility] Specialty Pharmacist

## 2022-12-24 DIAGNOSIS — J302 Other seasonal allergic rhinitis: Principal | ICD-10-CM

## 2022-12-24 MED ORDER — MONTELUKAST 10 MG TABLET
ORAL_TABLET | Freq: Every morning | ORAL | 2 refills | 90 days | Status: CP
Start: 2022-12-24 — End: ?

## 2023-01-05 DIAGNOSIS — E854 Organ-limited amyloidosis: Principal | ICD-10-CM

## 2023-01-05 DIAGNOSIS — I43 Cardiomyopathy in diseases classified elsewhere: Principal | ICD-10-CM

## 2023-01-05 MED ORDER — VYNDAMAX 61 MG CAPSULE
ORAL_CAPSULE | Freq: Every day | ORAL | 11 refills | 30 days | Status: CP
Start: 2023-01-05 — End: ?
  Filled 2023-01-11: qty 30, 30d supply, fill #0

## 2023-01-05 NOTE — Unmapped (Signed)
[facility] Clinical Assessment & Refill Coordination Note    Barbara Huber, DOB: February 09, 1941  Phone: [phone] (home)     All above HIPAA information was verified with patient.     Was a Nurse, learning disability used for this call? No    Specialty Medication(s):   General Specialty: Vyndamax     Current Outpatient Medications   Medication Sig Dispense Refill    acetaminophen (TYLENOL) 500 MG tablet Take 2 tablets (1,000 mg total) by mouth daily as needed for pain.      albuterol HFA 90 mcg/actuation inhaler Inhale 2 puffs every eight (8) hours as needed for wheezing.      amiodarone (PACERONE) 200 MG tablet Take 1 tablet (200 mg total) by mouth daily. 90 tablet 3    blood sugar diagnostic (ACCU-CHEK GUIDE TEST STRIPS) Strp Use to check blood sugar daily as instructed by clinic. Dx E11.9 For daily testing 100 strip 3    blood-glucose meter kit Use as instructed Daily testing 1 each 0    cranberry 500 mg cap Take 500 mg by mouth daily with evening meal.      estradioL (ESTRACE) 0.01 % (0.1 mg/gram) vaginal cream Insert 2 g into the vagina Two (2) times a week. 42.5 g 3    fluticasone propion-salmeteroL (ADVAIR HFA) 115-21 mcg/actuation inhaler Inhale 2 puffs Two (2) times a day. 12 g 11    fosfomycin (MONUROL) 3 gram Pack Take 3 g by mouth once a week. 36 g 3    gabapentin (NEURONTIN) 300 MG capsule Take 1 capsule (300 mg total) by mouth in the morning. 90 capsule 3    insulin glargine (BASAGLAR, LANTUS) 100 unit/mL (3 mL) injection pen Inject 0.04 mL (4 Units total) under the skin nightly. 15 mL 3    lancets Misc 1 each by Miscellaneous route daily. Accu Check 100 each 6    metOLazone (ZAROXOLYN) 5 MG tablet Take 1 tablet (5 mg total) by mouth daily as needed (when instructed by cardiology clinic). 30 tablet 1    montelukast (SINGULAIR) 10 mg tablet Take 1 tablet (10 mg total) by mouth every morning. 90 tablet 2    NARCAN 4 mg/actuation nasal spray 1 spray into alternating nostrils once as needed (opioid overdose). PRN - Emergency use. (Patient not taking: Reported on 12/04/2022)      NON FORMULARY APPLY FROM NECK DOWN TWICE A DAY      OXYCONTIN 10 mg TR12 12 hr crush resistant ER/CR tablet Take 1 tablet (10 mg total) by mouth every twelve (12) hours.      OXYGEN-AIR DELIVERY SYSTEMS MISC 5 L by Miscellaneous route. Currently using 3  L/min via [place]      pantoprazole (PROTONIX) 20 MG tablet Take 1 tablet (20 mg total) by mouth daily. 60 tablet 5    pen needle, diabetic (BD ULTRA-FINE SHORT PEN NEEDLE) 31 gauge x 5/16 (8 mm) Ndle Give 4 units of insulin each night 100 each 1    rosuvastatin (CRESTOR) 5 MG tablet Take 1 tablet (5 mg total) by mouth every other day. 15 tablet 11    sertraline (ZOLOFT) 25 MG tablet Take 1 tablet (25 mg total) by mouth daily. 90 tablet 3    spironolactone (ALDACTONE) 25 MG tablet Take 1 tablet (25 mg total) by mouth daily. 90 tablet 3    tafamidis (VYNDAMAX) 61 mg cap Take 1 capsule (61 mg) by mouth daily. 30 capsule 11    tiotropium bromide (SPIRIVA RESPIMAT)  2.5 mcg/actuation inhalation mist Inhale 2 puffs daily. 4 g 11    torsemide (DEMADEX) 20 MG tablet Take 3 tablets (60 mg total) by mouth daily. 270 tablet 3    varicella-zoster [id number], PF, (SHINGRIX, PF,) 50 mcg/0.5 mL SusR injection Inject 0.5 mL into the muscle. (Patient not taking: Reported on 12/04/2022) 0.5 mL 1     No current facility-administered medications for this visit.        Changes to medications: Lorrie reports no changes at this time.    Allergies   Allergen Reactions    Nitrofurantoin Anaphylaxis and Other (See Comments)    Lipitor [Atorvastatin] Muscle Pain     SOB, Headache, fatigue, sick on my stomach       Changes to allergies: No    SPECIALTY MEDICATION ADHERENCE     Vyndamax 61 mg: ~10 days of medicine on hand       Medication Adherence    Patient reported X missed doses in the last month: 0  Specialty Medication: Vyndamax 61mg   Informant: patient                            Specialty medication(s) dose(s) confirmed: Regimen is correct and unchanged.     Are there any concerns with adherence? No    Adherence counseling provided? Not needed    CLINICAL MANAGEMENT AND INTERVENTION      Clinical Benefit Assessment:    Do you feel the medicine is effective or helping your condition? Yes    Clinical Benefit counseling provided? Not needed    Adverse Effects Assessment:    Are you experiencing any side effects? No    Are you experiencing difficulty administering your medicine? 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 For example, brushing your teeth or getting up in the morning. Patient declined to answer    Have you discussed this with your provider? Not needed    Acute Infection Status:    Acute infections noted within Epic:  No active infections  Patient reported infection: None    Therapy Appropriateness:    Is therapy appropriate and patient progressing towards therapeutic goals? Yes, therapy is appropriate and should be continued    DISEASE/MEDICATION-SPECIFIC INFORMATION      N/A    Cardiology: Not Applicable    PATIENT SPECIFIC NEEDS     Does the patient have any physical, cognitive, or cultural barriers? No    Is the patient high risk? No    Did the patient require a clinical intervention? No    Does the patient require physician intervention or other additional services (i.e., nutrition, smoking cessation, social work)? No    SOCIAL DETERMINANTS OF HEALTH     At the [facility], we have learned that life circumstances - like trouble affording food, housing, utilities, or transportation can affect the health of many of our patients.   That is why we wanted to ask: are you currently experiencing any life circumstances that are negatively impacting your health and/or quality of life? Patient declined to answer    Social Determinants of Health     Financial Resource Strain: Low Risk  (12/08/2021)    Overall Physicist, medical Strain (  CARDIA)     Difficulty of Paying Living Expenses: Not hard at all   Internet Connectivity: Not on file   Food Insecurity: No Food Insecurity (12/08/2021)    Hunger Vital Sign     Worried About Running Out of Food in the Last Year: Never true     Ran Out of Food in the Last Year: Never true   Tobacco Use: Medium Risk (12/04/2022)    Patient History     Smoking Tobacco Use: Former     Smokeless Tobacco Use: Never     Passive Exposure: Not on file   Housing/Utilities: Low Risk  (12/08/2021)    Housing/Utilities     Within the past 12 months, have you ever stayed: outside, in a car, in a tent, in an overnight shelter, or temporarily in someone else's home (i.e. couch-surfing)?: No     Are you worried about losing your housing?: No     Within the past 12 months, have you been unable to get utilities (heat, electricity) when it was really needed?: No   Alcohol Use: Not At Risk (07/22/2022)    Alcohol Use     How often do you have a drink containing alcohol?: Never     How many drinks containing alcohol do you have on a typical day when you are drinking?: 1 - 2     How often do you have 5 or more drinks on one occasion?: Never   Transportation Needs: No Transportation Needs (12/08/2021)    PRAPARE - Transportation     Lack of Transportation (Medical): No     Lack of Transportation (Non-Medical): No   Substance Use: Low Risk  (05/02/2021)    Substance Use     Taken prescription drugs for non-medical reasons: Never     Taken illegal drugs: Never     Patient indicated they have taken drugs in the past year for non-medical reasons: Yes, [positive answer(s)]: Not on file   Health Literacy: Medium Risk (05/02/2021)    Health Literacy     : Rarely   Physical Activity: Not on file   Interpersonal Safety: Not on file   Stress: Not on file   Intimate Partner Violence: Not At Risk (12/17/2021)    Humiliation, Afraid, Rape, and Kick questionnaire     Fear of Current or Ex-Partner: No     Emotionally Abused: No     Physically Abused: No     Sexually Abused: No   Depression: Not at risk (04/22/2022)    PHQ-2     PHQ-2 Score: 0   Social Connections: Not on file       Would you be willing to receive help with any of the needs that you have identified today? Not applicable       SHIPPING     Specialty Medication(s) to be Shipped:   General Specialty: Vyndamax    Other medication(s) to be shipped: No additional medications requested for fill at this time     Changes to insurance: No    Delivery Scheduled: Yes, Expected medication delivery date: 01/12/23.  However, Rx request for refills was sent to the provider as there are none remaining.     Medication will be delivered via Same Day Courier to the confirmed prescription address in [facility].    The patient will receive a drug information handout for each medication shipped and additional FDA Medication Guides as required.  Verified that patient has previously received a Conservation officer, historic buildings and a  Notice of Chief Technology Officer.    The patient or caregiver noted above participated in the development of this care plan and knows that they can request review of or adjustments to the care plan at any time.      All of the patient's questions and concerns have been addressed.    Camillo Flaming, PharmD   [facility] Specialty Pharmacist

## 2023-01-06 MED ORDER — AMIODARONE 200 MG TABLET
ORAL_TABLET | Freq: Every day | ORAL | 3 refills | 0 days
Start: 2023-01-06 — End: ?

## 2023-01-07 MED ORDER — AMIODARONE 200 MG TABLET
ORAL_TABLET | Freq: Every day | ORAL | 3 refills | 90 days | Status: CP
Start: 2023-01-07 — End: ?

## 2023-01-07 NOTE — Unmapped (Signed)
Refill request received for patient.      Medication Requested: amiodarone  Last Office Visit: 12/04/2022-byku  Next Office Visit: 02/10/2023  Last Prescriber: Bea Laura baker    Nurse refill requirements met? Yes  If not met, why: amiodarone monitoring normal last    Sent to: Provider for signing  If sent to provider, which provider?: carrie neal, e baker

## 2023-02-03 NOTE — Unmapped (Signed)
[facility] Refill Coordination Note    Specialty Medication(s) to be Shipped:   General Specialty: Vyndamax    Other medication(s) to be shipped: No additional medications requested for fill at this time     Barbara Huber, DOB: Jan 16, 1941  Phone: [phone] (home)       All above HIPAA information was verified with patient.     Was a Nurse, learning disability used for this call? No    Completed refill call assessment today to schedule patient's medication shipment from the [facility] [phone]).  All relevant notes have been reviewed.     Specialty medication(s) and dose(s) confirmed: Regimen is correct and unchanged.   Changes to medications: Carnella reports no changes at this time.  Changes to insurance: No  New side effects reported not previously addressed with a pharmacist or physician: None reported  Questions for the pharmacist: No    Confirmed patient received a Conservation officer, historic buildings and a Surveyor, mining with first shipment. The patient will receive a drug information handout for each medication shipped and additional FDA Medication Guides as required.       DISEASE/MEDICATION-SPECIFIC INFORMATION        N/A    SPECIALTY MEDICATION ADHERENCE     Medication Adherence    Patient reported X missed doses in the last month: 0  Specialty Medication: Vyndamax 61mg   Informant: patient                                Were doses missed due to medication being on hold? No    Vyndamax 61 mg: ~10 days of medicine on hand     REFERRAL TO PHARMACIST     Referral to the pharmacist: Not needed      [facility]     Shipping address confirmed in Epic.     Delivery Scheduled: Yes, Expected medication delivery date: 02/09/23.     Medication will be delivered via Next Day Courier to the prescription address in Epic [facility].    Camillo Flaming, PharmD   [facility] Specialty Pharmacist

## 2023-02-08 MED FILL — VYNDAMAX 61 MG CAPSULE: ORAL | 30 days supply | Qty: 30 | Fill #1

## 2023-02-09 NOTE — Unmapped (Signed)
[facility] Note    Referring Provider: Carin Hock, MD  [address]  [address]  [place],  [place] [address]   Primary Provider: Artelia Laroche, MD  [address]  [place] [address]   Other Providers:  Dr Luretha Murphy    Reason for Visit:  Barbara Huber is a 82 y.o. female being seen for routine visit and continued care of cardiac amyloidosis .    Assessment & Plan:  1. 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 SPEP/UPEP/free light chains without concern. 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 Daughter is informed and has seen cardiology [name]).   - DC'd Jardiance 10mg  daily due to recurrent UTIs  - Weight slightly above baseline today at 216 lb, target 213-214 at home. Continue torsemide 40mg  daily with extra 20mg  PRN.   - Continue Tafamidis 61 mg daily   - Continue spironolactone 25 mg daily   - She is not interested in CardioMEMS at this time    2. Persistent atrial fibrillation  - CHA2DS2-VASc score = 5 (1-gender, 2-Age, 1-DM, 1-HTN)  - We stopped Eliquis given recurrent anemia, shared decision making w/ pt and daughter. She is not interested in [place] (or any procedures) at this time  - She had been in afib since at least 07/2020; now s/p cardioversion 01/10/21 and continues to be in NSR since  - On amiodarone 200mg  daily - increased it back herself after seeing no improvement in tremors/jerking with decreased dose  - amiodarone monitoring - last PFTs in 04/2021. TSH normal 09/2022; LFTs normal 11/2022    3. [name]  - Cr staying around 2.3-3.0; 2.64 today  - following with nephrology    4. COPD  - home O2 3L [place] - followed by pulm  - on Advair and Spiriva    5. Iron deficiency anemia  - Finished IV Iron - ferritin 178. No longer anemic, Hgb 13.2  Lab Results   Component Value Date    FERRITIN 178.0 08/19/2022     6. Anxiety  - Tolerating Zoloft 25mg  with improved moved/anxiety    Follow-up:  Return in about 2 months (around 04/11/2023) for Return HF.     History of Present Illness:  Barbara Huber is a 82 y.o. female with PMHx of COPD on 3L home [place], CHF, [name], [name], [name] She was initially admitted with decompensated [name] in 2021 in setting of  Septic Shock d/t Gangrenous Cholecystitis s/p cholecystectomy. She underwent further evaluation with PYP scan which showed TTR amyloid. She has since been followed in clinic for management of her [name]. In 2022, had numerous admissions for weakness/urosepsis, shortness of breath/orthopnea/weight gain, lightheadedness/SOB/Blurry vision found to be anemic.    Summary of most recent visits:  - 01/16/22 [facility]): Feeling well from a cardiac standpoint. Started Zoloft for anxiety  - 02/13/22 [facility]): Weight 210-211. Feeling well. Taking extra torsemide 20mg  about every other day, which was stopped for elevated Cr.   - 03/13/22 ([name]): Weight 201-216, took metolazone PRN, weight stabilized at 210lbs. Considering cardioMEMS.   - 05/13/22 ([name]): Had 3 iron infusions. Doing well w/ volume,  weight 211-212lbs. Cooking, staying active around house.   - 05/21/22 ([name]): CP/SOB. Weight 217 from 213. She took metolazone x1 dose.   - 05/22/22 ([name]): Ed follow up. Back down to 213lbs. Still having CP w/ certain movement.   - 06/05/22 ([name]): Weight 214-216lb, should be 212lb.   - 06/09/22, 06/11/22, 06/22/22 ([name]): Weight down from 217 to 213lb, 211lb at home. Feeling well by 6/26.   - 07/15/22 ([name]): Switched inhaler back to Advair/Spiriva with improvement in breathing and O2 sat. Weight 207lbs, feeling more like herself.   - 08/19/22 ([name]): Feeling well. Weight 208-210lbs.   - 10/22/22 ([name]): Feeling well. Weight has been 210-214lbs.   - 12/04/22 ([name]): doing well overall, weight a bit up so taking extra torsemide.     Interval history  Today [name] presents to the clinic for a follow up visit. She is accomapnied by her daughter. She's been feeling pretty well. She has been taking torsemide 40mg  in the morning and 20mg  in the afternoon, with an extra 20mg  a few times a week if she gains weight. She actually took a metolazone on Monday because her weight got up to 216lbs; she likes it better around 213-214lbs. Weight has been 213 at the lowest. She is not SOB, bloating, or swollen more than usual. Her appetite has been very good, eating a lot. She does get tired, but fatigue is stable from last visit. She is using stable amt of oxygen, 3L, with O2 sats >90% at home.       Cardiovascular History & Procedures:    Cath / PCI:  none    CV Surgery:   none    EP Procedures and Devices:  none    Non-Invasive Evaluation(s):    Echo:  12/09/21  Summary    1. The left ventricle is normal in size with severely increased wall  thickness.    2. The left ventricular systolic function is hyperdynamic, LVEF is visually  estimated at >70%.    3. There is grade III diastolic dysfunction (severely elevated filling  pressure).    4. The left atrium is moderately to severely dilated in size.    5. The right ventricle is mildly dilated in size, with normal systolic  function.    6. The right atrium is mildly dilated  in size.    7. IVC size and inspiratory change suggest elevated right atrial pressure.  (10-20 mmHg).    04/24/21   1. The left ventricle is normal in size with mildly increased wall  thickness.    2. The left ventricular systolic function is normal, LVEF is visually  estimated at > 55%.    3. There is grade II diastolic dysfunction (elevated filling pressure).    4. Mitral annular calcification is present (mild).    5. The left atrium is mildly dilated in size.    6. The right ventricle is mildly dilated in size, with low normal systolic  function.    7. IVC size and inspiratory change suggest elevated right atrial pressure.  (10-20 mmHg).    09/30/20  Summary    1. Limited study to assess systolic function.    2. IVC size and inspiratory change suggest normal right atrial pressure.  (0-5 mmHg).    3. The left ventricle is normal in size with mildly to moderately increased  wall thickness.    4. The left ventricular systolic function is normal with no obvious wall  motion abnormalities, LVEF is visually  estimated at > 55%.    5. Mitral annular calcification is present.    6. The left atrium is mildly dilated in size.    7. The right ventricle is upper normal in size, with reduced systolic  Function.    Cardiac CT/MRI/Nuclear Tests:   None    6 Minute Walk:  None    Cardiopulmonary Stress Tests:   None               Other Past Medical History:  See below for the complete EPIC list of past medical and surgical history.      Allergies:  Nitrofurantoin and Lipitor [atorvastatin]    Current Medications:  Current Outpatient Medications   Medication Sig Dispense Refill    acetaminophen (TYLENOL) 500 MG tablet Take 2 tablets (1,000 mg total) by mouth daily as needed for pain.      albuterol HFA 90 mcg/actuation inhaler Inhale 2 puffs every eight (8) hours as needed for wheezing.      amiodarone (PACERONE) 200 MG tablet TAKE ONE TABLET BY MOUTH ONCE DAILY 90 tablet 3    blood sugar diagnostic (ACCU-CHEK GUIDE TEST STRIPS) Strp Use to check blood sugar daily as instructed by clinic. Dx E11.9 For daily testing 100 strip 3    blood-glucose meter kit Use as instructed Daily testing 1 each 0    cranberry 500 mg cap Take 500 mg by mouth daily with evening meal.      estradioL (ESTRACE) 0.01 % (0.1 mg/gram) vaginal cream Insert 2 g into the vagina Two (2) times a week. 42.5 g 3    fluticasone propion-salmeteroL (ADVAIR HFA) 115-21 mcg/actuation inhaler Inhale 2 puffs Two (2) times a day. 12 g 11    fosfomycin (MONUROL) 3 gram Pack Take 3 g by mouth once a week. 36 g 3    gabapentin (NEURONTIN) 300 MG capsule Take 1 capsule (300 mg total) by mouth in the morning. 90 capsule 3    lancets Misc 1 each by Miscellaneous route daily. Accu Check 100 each 6    montelukast (SINGULAIR) 10 mg tablet Take 1 tablet (10 mg total) by mouth every morning. 90 tablet 2    OXYCONTIN 10 mg TR12 12 hr crush resistant ER/CR tablet Take 1 tablet (10 mg total) by mouth every twelve (12) hours.      OXYGEN-AIR DELIVERY SYSTEMS MISC 5 L by Miscellaneous route. Currently using 3  L/min via [place]      pantoprazole (PROTONIX) 20 MG tablet Take 1 tablet (20 mg total) by mouth daily. 60 tablet 5    pen needle, diabetic (BD ULTRA-FINE SHORT PEN NEEDLE) 31 gauge x 5/16 (8 mm) Ndle Give 4 units of insulin each night 100 each 1    rosuvastatin (CRESTOR) 5 MG tablet Take 1 tablet (5 mg total) by mouth every other day. 15 tablet 11    sertraline (ZOLOFT) 25 MG tablet Take 1 tablet (25 mg total) by mouth daily. 90 tablet 3    spironolactone (ALDACTONE) 25 MG tablet Take 1 tablet (25 mg total) by mouth daily. 90 tablet 3    tafamidis (VYNDAMAX) 61 mg cap Take 1 capsule (61 mg) by mouth daily. 30 capsule 11    tiotropium bromide (SPIRIVA RESPIMAT) 2.5 mcg/actuation inhalation mist Inhale 2 puffs daily. 4 g 11    torsemide (DEMADEX) 20 MG tablet Take 3 tablets (60 mg total) by mouth daily. 270 tablet 3    FEROSUL 325 mg (65 mg iron) tablet Take 1  tablet (325 mg total) by mouth every other day. (Patient not taking: Reported on 02/10/2023)      insulin glargine (BASAGLAR, LANTUS) 100 unit/mL (3 mL) injection pen Inject 0.04 mL (4 Units total) under the skin nightly. 15 mL 3    metOLazone (ZAROXOLYN) 5 MG tablet Take 1 tablet (5 mg total) by mouth daily as needed (when instructed by cardiology clinic). 30 tablet 1    NARCAN 4 mg/actuation nasal spray 1 spray into alternating nostrils once as needed (opioid overdose). PRN - Emergency use. (Patient not taking: Reported on 12/04/2022)      NON FORMULARY APPLY FROM NECK DOWN TWICE A DAY      varicella-zoster [id number]-[id number], PF, (SHINGRIX, PF,) 50 mcg/0.5 mL SusR injection Inject 0.5 mL into the muscle. (Patient not taking: Reported on 12/04/2022) 0.5 mL 1     No current facility-administered medications for this visit.       Family History:  The patient's family history includes Cancer in her father; Hypertension in her mother.    Social history:  She  reports that she quit smoking about 5 years ago. Her smoking use included cigarettes. She has never used smokeless tobacco. She reports that she does not drink alcohol and does not use drugs.    Review of Systems:  As per HPI.  Rest of the review of ten systems is negative or unremarkable except as stated above.    Physical Exam:  VITAL SIGNS:   Vitals:    02/10/23 1144   BP: 113/60   Pulse: 64   SpO2: (!) 87%     Wt Readings from Last 3 Encounters:   02/10/23 98 kg (216 lb)   12/04/22 99.3 kg (219 lb)   10/28/22 97.1 kg (214 lb)      Today's Body mass index is 31.9 kg/m??.      CONSTITUTIONAL: well-appearing in no acute distress  EYES: Conjunctivae and sclerae clear and anicteric.  ENT: Benign.   CARDIOVASCULAR: JVP not seen above the clavicle with HOB at 90 degrees. Rate and rhythm are regular.  There is no lifts or heaves.  Normal S1, S2. There is no murmur, gallops or rubs.  Radial and pedal pulses are 2+, bilaterally.   There is no edema to ankles, bilaterally.   RESPIRATORY: Decreased breath sounds at bases.  There are no wheezes.  GASTROINTESTINAL: Soft, non-tender, with audible bowel sounds. Abdomen nondistended.  Liver is nonpalpable.  SKIN: No rashes, ecchymosis or petechiae.  Warm, well perfused.   MUSCULOSKELETAL:  no joint swelling   NEURO/PSYCH: Appropriate mood and affect. Alert and oriented to person, place, and time. No gross motor or sensory deficits evident.    Pertinent Laboratory Studies:   Office Visit on 02/10/2023   Component Date Value Ref Range Status    BNP 02/10/2023 110.10 (H)  <=100 pg/mL Final    Sodium 02/10/2023 138  135 - 145 mmol/L Final    Potassium 02/10/2023 4.4  3.4 - 4.8 mmol/L Final    Chloride 02/10/2023 101  98 - 107 mmol/L Final    CO2 02/10/2023 28.8  20.0 - 31.0 mmol/L Final    Anion Gap 02/10/2023 8  5 - 14 mmol/L Final    BUN 02/10/2023 69 (H)  9 - 23 mg/dL Final    Creatinine 16/09/9603 2.64 (H)  0.55 - 1.02 mg/dL Final    BUN/Creatinine Ratio 02/10/2023 26   Final    eGFR [name]-EPI (2021) Female 02/10/2023 18 (L)  >=60 mL/min/1.21m2 Final  Glucose 02/10/2023 107  70 - 179 mg/dL Final    Calcium 45/40/9811 9.5  8.7 - 10.4 mg/dL Final    Magnesium 91/47/8295 2.5  1.6 - 2.6 mg/dL Final   Office Visit on 12/04/2022   Component Date Value Ref Range Status    Sodium 12/04/2022 137  135 - 145 mmol/L Final    Potassium 12/04/2022 4.1  3.4 - 4.8 mmol/L Final    Chloride 12/04/2022 100  98 - 107 mmol/L Final    CO2 12/04/2022 26.3  20.0 - 31.0 mmol/L Final    Anion Gap 12/04/2022 11  5 - 14 mmol/L Final    BUN 12/04/2022 77 (H)  9 - 23 mg/dL Final    Creatinine 62/13/0865 3.02 (H)  0.55 - 1.02 mg/dL Final    BUN/Creatinine Ratio 12/04/2022 25   Final    eGFR [name]-EPI (2021) Female 12/04/2022 15 (L)  >=60 mL/min/1.27m2 Final    Glucose 12/04/2022 238 (H)  70 - 179 mg/dL Final    Calcium 78/46/9629 9.4  8.7 - 10.4 mg/dL Final    Albumin 52/84/1324 3.6  3.4 - 5.0 g/dL Final    Total Protein 12/04/2022 7.8  5.7 - 8.2 g/dL Final    Total Bilirubin 12/04/2022 0.5  0.3 - 1.2 mg/dL Final    AST 40/09/2724 13  <=34 U/L Final    ALT 12/04/2022 <7 (L)  10 - 49 U/L Final    Alkaline Phosphatase 12/04/2022 101  46 - 116 U/L Final    PRO-BNP 12/04/2022 3,254.0 (H)  0.0 - 450.0 pg/mL Final   Appointment on 10/29/2022   Component Date Value Ref Range Status    Hemoglobin A1C 10/29/2022 5.7 (H)  4.8 - 5.6 % Final    Estimated Average Glucose 10/29/2022 117  mg/dL Final   Office Visit on 10/22/2022   Component Date Value Ref Range Status    Spec Gravity/POC 10/22/2022 <=1.005  1.003 - 1.030 Final    PH/POC 10/22/2022 5.0 5.0 - 9.0 Final    Leuk Esterase/POC 10/22/2022 Trace (A)  Negative Final    Nitrite/POC 10/22/2022 Negative  Negative Final    Protein/POC 10/22/2022 Negative  Negative Final    UA Glucose/POC 10/22/2022 Negative  Negative Final    Ketones, POC 10/22/2022 Negative  Negative Final    Bilirubin/POC 10/22/2022 Negative  Negative Final    Blood/POC 10/22/2022 Trace-intact (A)  Negative Final    Urobilinogen/POC 10/22/2022 0.2  0.2 - 1.0 mg/dL Final    Urine Culture, Comprehensive 10/22/2022 Mixed Gram Positive/Gram Negative Organisms Isolated (A)   Final   Office Visit on 10/22/2022   Component Date Value Ref Range Status    Sodium 10/22/2022 141  135 - 145 mmol/L Final    Potassium 10/22/2022 4.0  3.4 - 4.8 mmol/L Final    Chloride 10/22/2022 105  98 - 107 mmol/L Final    CO2 10/22/2022 27.5  20.0 - 31.0 mmol/L Final    Anion Gap 10/22/2022 9  5 - 14 mmol/L Final    BUN 10/22/2022 71 (H)  9 - 23 mg/dL Final    Creatinine 36/64/4034 2.14 (H)  0.55 - 1.02 mg/dL Final    BUN/Creatinine Ratio 10/22/2022 33   Final    eGFR [name]-EPI (2021) Female 10/22/2022 23 (L)  >=60 mL/min/1.65m2 Final    Glucose 10/22/2022 157  70 - 179 mg/dL Final    Calcium 74/25/9563 9.5  8.7 - 10.4 mg/dL Final    Albumin 87/56/4332 3.8  3.4 - 5.0  g/dL Final    Total Protein 10/22/2022 7.9  5.7 - 8.2 g/dL Final    Total Bilirubin 10/22/2022 0.5  0.3 - 1.2 mg/dL Final    AST 81/19/1478 12  <=34 U/L Final    ALT 10/22/2022 <7 (L)  10 - 49 U/L Final    Alkaline Phosphatase 10/22/2022 99  46 - 116 U/L Final    BNP 10/22/2022 211.14 (H)  <=100 pg/mL Final    Magnesium 10/22/2022 2.7 (H)  1.6 - 2.6 mg/dL Final    WBC 29/56/2130 8.0  3.6 - 11.2 10*9/L Final    RBC 10/22/2022 4.35  3.95 - 5.13 10*12/L Final    HGB 10/22/2022 13.2  11.3 - 14.9 g/dL Final    HCT 86/57/8469 41.0  34.0 - 44.0 % Final    MCV 10/22/2022 94.5  77.6 - 95.7 fL Final    MCH 10/22/2022 30.4  25.9 - 32.4 pg Final    MCHC 10/22/2022 32.2  32.0 - 36.0 g/dL Final    RDW 62/95/2841 13.1 12.2 - 15.2 % Final    MPV 10/22/2022 9.0  6.8 - 10.7 fL Final    Platelet 10/22/2022 203  150 - 450 10*9/L Final    TSH 10/22/2022 1.214  0.550 - 4.780 uIU/mL Final       Lab Results   Component Value Date    PRO-BNP 3,254.0 (H) 12/04/2022    PRO-BNP 3,420.0 (H) 07/03/2021    PRO-BNP 905.0 (H) 03/21/2021    Creatinine 2.64 (H) 02/10/2023    Creatinine 3.02 (H) 12/04/2022    Creatinine 0.79 01/06/2011    BUN 69 (H) 02/10/2023    BUN 77 (H) 12/04/2022    BUN 20 01/06/2011    Sodium 138 02/10/2023    Sodium 140 01/06/2011    Potassium 4.4 02/10/2023    Potassium 4.1 01/06/2011    CO2 28.8 02/10/2023    CO2 30 01/06/2011    Magnesium 2.5 02/10/2023    Magnesium 2.0 01/06/2011    Total Bilirubin 0.5 12/04/2022    INR 2.15 07/01/2021    INR 1.0 01/06/2011       Lab Results   Component Value Date    Digoxin Level 0.9 10/01/2020       Lab Results   Component Value Date    TSH 1.214 10/22/2022    Cholesterol 95 12/06/2021    Triglycerides 66 12/06/2021    HDL 42 12/06/2021    Non-HDL Cholesterol 53 (L) 12/06/2021    LDL Calculated 40 12/06/2021       Lab Results   Component Value Date    WBC 8.0 10/22/2022    WBC 12.5 (H) 01/06/2011    HGB 13.2 10/22/2022    HGB 12.9 01/06/2011    HCT 41.0 10/22/2022    HCT 40.8 01/06/2011    Platelet 203 10/22/2022    Platelet 260 01/06/2011       Pertinent Test Results from Today:  None    Other pertinent records were reviewed.    The following are further history from the patient's EPIC record for reference:     Past Medical History:   Diagnosis Date    Acute kidney injury superimposed on chronic kidney disease (CMS-HCC) 10/11/2020    Acute on chronic diastolic (congestive) heart failure (CMS-HCC) 08/23/2020    AKI (acute kidney injury) (CMS-HCC) 04/14/2015    Lab Results  Component  Value  Date     CREATININE  1.90 (H)  06/12/2021  Had a bump in her creatinine when she was taking her diuretics every day.  She is currently taking 40 mg daily of torsemide and 50 mg of spironolactone. Her volume status is fragile.  Previously when she stopped her diuretic she becomes short of breath.  Plan: We will check her BMP today.  We will likely have to go to 40    Arthritis     Calculus of kidney     Calculus of ureter     CHF (congestive heart failure) (CMS-HCC)     Chronic atrial fibrillation (CMS-HCC) 07/20/2019    COPD (chronic obstructive pulmonary disease) (CMS-HCC)     Diabetes (CMS-HCC)     Gangrenous cholecystitis 10/11/2020    Generalized edema  06/17/2021    GERD (gastroesophageal reflux disease)     Hydronephrosis     Hypertension     Hyponatremia 10/11/2020    Intermediate coronary syndrome (CMS-HCC) 03/13/2014    Lower extremity edema 09/28/2020    Lumbar stenosis     Microscopic hematuria     Nausea alone     Nephrolithiasis 04/17/2016    Neuropathy     Nocturia     Nocturia 07/01/2017    Other chronic cystitis     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  See plans for anemia, AKI  We are also tapering her gabapentin.  Currently on 300 mg 3 times daily.  We will decrease it to twice daily, and then nightly.  She states she is not having any recurrence of her pain.    Pulmonary hypertension (CMS-HCC)     Renal colic     Shortness of breath 04/28/2020    Sleep apnea     Unstable angina pectoris (CMS-HCC) 03/13/2014       Past Surgical History:   Procedure Laterality Date    BACK SURGERY  1995    CARPAL TUNNEL RELEASE Left 2014    HYSTERECTOMY  1971    IR INSERT CHOLECYSTOSMY TUBE PERCUTANEOUS  10/02/2020    IR INSERT CHOLECYSTOSMY TUBE PERCUTANEOUS 10/02/2020 Braulio Conte, MD IMG VIR [facility]V [facility]    LUMBAR DISC SURGERY      PR REMOVAL GALLBLADDER N/A 10/06/2020    Procedure: CHOLECYSTECTOMY;  Surgeon: Katherina Mires, MD;  Location: MAIN OR [facility];  Service: Trauma    PR RIGHT HEART CATH O2 SATURATION & CARDIAC OUTPUT N/A 09/30/2020    Procedure: Right Heart Catheterization;  Surgeon: Neal Dy, MD;  Location: [facility] CATH;  Service: Cardiology    PR RIGHT HEART CATH O2 SATURATION & CARDIAC OUTPUT N/A 01/09/2021    Procedure: Right Heart Catheterization;  Surgeon: Lesle Reek, MD;  Location: [facility] CATH;  Service: Cardiology

## 2023-02-10 ENCOUNTER — Ambulatory Visit: Admit: 2023-02-10 | Discharge: 2023-02-11 | Payer: MEDICARE | Attending: Family | Primary: Family

## 2023-02-10 ENCOUNTER — Ambulatory Visit: Admit: 2023-02-10 | Discharge: 2023-02-11 | Payer: MEDICARE | Attending: Adult Health | Primary: Adult Health

## 2023-02-10 DIAGNOSIS — I5032 Chronic diastolic (congestive) heart failure: Principal | ICD-10-CM

## 2023-02-10 LAB — BASIC METABOLIC PANEL
ANION GAP: 8 mmol/L (ref 5–14)
BLOOD UREA NITROGEN: 69 mg/dL — ABNORMAL HIGH (ref 9–23)
BUN / CREAT RATIO: 26
CALCIUM: 9.5 mg/dL (ref 8.7–10.4)
CHLORIDE: 101 mmol/L (ref 98–107)
CO2: 28.8 mmol/L (ref 20.0–31.0)
CREATININE: 2.64 mg/dL — ABNORMAL HIGH
EGFR CKD-EPI (2021) FEMALE: 18 mL/min/{1.73_m2} — ABNORMAL LOW (ref >=60)
GLUCOSE RANDOM: 107 mg/dL (ref 70–179)
POTASSIUM: 4.4 mmol/L (ref 3.4–4.8)
SODIUM: 138 mmol/L (ref 135–145)

## 2023-02-10 LAB — B-TYPE NATRIURETIC PEPTIDE: B-TYPE NATRIURETIC PEPTIDE: 110.1 pg/mL — ABNORMAL HIGH (ref <=100)

## 2023-02-10 LAB — MAGNESIUM: MAGNESIUM: 2.5 mg/dL (ref 1.6–2.6)

## 2023-02-10 NOTE — Unmapped (Signed)
Today,    MEDICATIONS:  NO medication changes today.    Call if you have questions about your medications.    LABS:  We will call you if your labs need attention.    NEXT APPOINTMENT:  Return to clinic in 2 months with me      In general, to take care of your heart failure:  -Limit your fluid intake to 2 Liters (half-gallon) per day.    -Limit your salt intake to ideally 2-3 grams (2000-3000 mg) per day.  -Weigh yourself daily and record, and bring that weight diary to your next appointment.  (Weight gain of 2-3 pounds in 1 day typically means fluid weight.)    The medications for your heart are to help your heart and help you live longer.    Please contact us before stopping any of your heart medications.    Call the clinic at [phone] with questions.  Our clinic fax number is [phone].  If you need to reschedule future appointments, please call [phone] or [phone]  My office number (c/o De Burrs RN) is [phone] if you need further assistance.  After office hours, if you have urgent questions/problems, contact the on-call cardiologist through the hospital operator: [phone].    Please do not send a MyChart message for potentially life-threatening symptoms.  Please call 911 for a true medical emergency.    To learn more about heart failure, please read [facility] Learning to Live with Heart Failure - Available online at:  [email or web address] - open the window for Medical Management and click the link Living with Heart Failure   (Can search [facility] heart failure on the web to find the link.)

## 2023-02-14 ENCOUNTER — Ambulatory Visit: Admit: 2023-02-14 | Discharge: 2023-02-18 | Payer: MEDICARE

## 2023-02-14 ENCOUNTER — Ambulatory Visit
Admit: 2023-02-14 | Discharge: 2023-02-18 | Disposition: A | Discharge status: Home / Self Care | Payer: MEDICARE | Admitting: Allergy

## 2023-02-14 LAB — COMPREHENSIVE METABOLIC PANEL
ALBUMIN: 3.7 g/dL (ref 3.4–5.0)
ALKALINE PHOSPHATASE: 104 U/L (ref 46–116)
ALT (SGPT): 9 U/L — ABNORMAL LOW (ref 10–49)
ANION GAP: 9 mmol/L (ref 5–14)
AST (SGOT): 19 U/L (ref <=34)
BILIRUBIN TOTAL: 0.6 mg/dL (ref 0.3–1.2)
BLOOD UREA NITROGEN: 92 mg/dL — ABNORMAL HIGH (ref 9–23)
BUN / CREAT RATIO: 26
CALCIUM: 9.1 mg/dL (ref 8.7–10.4)
CHLORIDE: 101 mmol/L (ref 98–107)
CO2: 25.3 mmol/L (ref 20.0–31.0)
CREATININE: 3.5 mg/dL — ABNORMAL HIGH
EGFR CKD-EPI (2021) FEMALE: 13 mL/min/{1.73_m2} — ABNORMAL LOW (ref >=60)
GLUCOSE RANDOM: 203 mg/dL — ABNORMAL HIGH (ref 70–179)
POTASSIUM: 4.9 mmol/L — ABNORMAL HIGH (ref 3.4–4.8)
PROTEIN TOTAL: 8 g/dL (ref 5.7–8.2)
SODIUM: 135 mmol/L (ref 135–145)

## 2023-02-14 LAB — CBC W/ AUTO DIFF
BASOPHILS ABSOLUTE COUNT: 0.1 10*9/L (ref 0.0–0.1)
BASOPHILS RELATIVE PERCENT: 0.8 %
EOSINOPHILS ABSOLUTE COUNT: 0.2 10*9/L (ref 0.0–0.5)
EOSINOPHILS RELATIVE PERCENT: 1.4 %
HEMATOCRIT: 41.1 % (ref 34.0–44.0)
HEMOGLOBIN: 13.3 g/dL (ref 11.3–14.9)
LYMPHOCYTES ABSOLUTE COUNT: 1 10*9/L — ABNORMAL LOW (ref 1.1–3.6)
LYMPHOCYTES RELATIVE PERCENT: 8.9 %
MEAN CORPUSCULAR HEMOGLOBIN CONC: 32.3 g/dL (ref 32.0–36.0)
MEAN CORPUSCULAR HEMOGLOBIN: 29.6 pg (ref 25.9–32.4)
MEAN CORPUSCULAR VOLUME: 91.8 fL (ref 77.6–95.7)
MEAN PLATELET VOLUME: 9.3 fL (ref 6.8–10.7)
MONOCYTES ABSOLUTE COUNT: 1 10*9/L — ABNORMAL HIGH (ref 0.3–0.8)
MONOCYTES RELATIVE PERCENT: 9.5 %
NEUTROPHILS ABSOLUTE COUNT: 8.7 10*9/L — ABNORMAL HIGH (ref 1.8–7.8)
NEUTROPHILS RELATIVE PERCENT: 79.4 %
NUCLEATED RED BLOOD CELLS: 0 /100{WBCs} (ref <=4)
PLATELET COUNT: 159 10*9/L (ref 150–450)
RED BLOOD CELL COUNT: 4.48 10*12/L (ref 3.95–5.13)
RED CELL DISTRIBUTION WIDTH: 13.1 % (ref 12.2–15.2)
WBC ADJUSTED: 11 10*9/L (ref 3.6–11.2)

## 2023-02-14 LAB — URINALYSIS WITH MICROSCOPY WITH CULTURE REFLEX
BILIRUBIN UA: NEGATIVE
BLOOD UA: NEGATIVE
GLUCOSE UA: NEGATIVE
HYALINE CASTS: 26 /LPF — ABNORMAL HIGH (ref 0–1)
KETONES UA: NEGATIVE
NITRITE UA: NEGATIVE
PH UA: 5 (ref 5.0–9.0)
PROTEIN UA: NEGATIVE
RBC UA: <1 /HPF (ref <=4)
SPECIFIC GRAVITY UA: 1.011 (ref 1.003–1.030)
SQUAMOUS EPITHELIAL: 2 /HPF (ref 0–5)
UROBILINOGEN UA: <2
WBC UA: 4 /HPF (ref 0–5)

## 2023-02-14 LAB — HIGH SENSITIVITY TROPONIN I - 2H/6H SERIAL
HIGH SENSITIVITY TROPONIN - DELTA (0-2H): 8 ng/L — ABNORMAL HIGH (ref <=7)
HIGH-SENSITIVITY TROPONIN I - 2 HOUR: 88 ng/L (ref <=34)

## 2023-02-14 LAB — HIGH SENSITIVITY TROPONIN I - 4 HOUR SERIAL
HIGH SENSITIVITY TROPONIN - DELTA (2-6H): 5 ng/L (ref <=7)
HIGH-SENSITIVITY TROPONIN I - 6 HOUR: 83 ng/L (ref <=34)

## 2023-02-14 LAB — B-TYPE NATRIURETIC PEPTIDE: B-TYPE NATRIURETIC PEPTIDE: 838.3 pg/mL — ABNORMAL HIGH (ref <=100)

## 2023-02-14 LAB — HIGH SENSITIVITY TROPONIN I - SINGLE: HIGH SENSITIVITY TROPONIN I: 80 ng/L (ref <=34)

## 2023-02-14 MED ADMIN — acetaminophen (TYLENOL) tablet 650 mg: 650 mg | ORAL | @ 19:00:00 | Stop: 2023-02-14

## 2023-02-14 MED ADMIN — lidocaine 4 % patch 1 patch: 1 | TRANSDERMAL | @ 19:00:00

## 2023-02-14 MED ADMIN — furosemide (LASIX) injection 40 mg: 40 mg | INTRAVENOUS | @ 21:00:00 | Stop: 2023-02-14

## 2023-02-14 NOTE — Unmapped (Signed)
[facility]  Emergency Department Provider Note     ED Clinical Impression     Final diagnoses:   Dyspnea, unspecified type (Primary)   Fall, initial encounter   Acute on chronic congestive heart failure, unspecified heart failure type (CMS-HCC)      Impression, Medical Decision Making, ED Course     Impression: 82 y.o. female with PMH most significant for HTN, A-fib s/p DCCV (not on AC), [name], cardiac amyloidosis, COPD on 3L [place], T2DM on [place], stage IV [name], IDA who presents with increased dyspnea on exertion since last night in the setting of a fall yesterday morning as described below.    Initial vitals are generally reassuring.  Patient is saturating well on her home 3 L nasal cannula.  On exam, left hip tenderness to palpation. No midline spinal tenderness, step-offs, or deformities. Left lumbar paraspinal tenderness. Lungs clear to auscultation bilaterally.  Moving all 4 extremities equally.    Initial differential includes but is not limited to pneumonia, CHF exacerbation, COPD, amongst multiple etiologies.  Will plan for basic labs including CBC, CMP to evaluate for any signs of anemia, electrolyte abnormality, renal function.  Will also get a UA to evaluate for UTI given her fall.  Will get EKG and troponin to rule out ACS and look for any arrhythmias.  Will get a BNP to evaluate for any signs of CHF exacerbation.  Will get COVID-19, influenza, RSV swabs.  Will also get a chest x-ray to look for any signs of pneumonia, pneumothorax, pulmonary edema, pleural effusions.  Will get a chest x-ray of the left hip given her fall to look for any fractures or dislocations.  Considered pulmonary embolism however patient is not tachycardic, saturating well on her home 3 L nasal cannula and without tachypnea so lower suspicion at this time.  Patient has reassuring neurologic exam, no loss of consciousness, and is not on blood thinners so do not feel like she needs any head imaging at this time.  No red flag back signs or symptoms and no midline tenderness so low suspicion for traumatic back injuries, cauda equina syndrome, epidural abscess.  Will plan to treat patient's pain with lidocaine patch and Tylenol.    ED Course as of 02/14/23 1603   Sun Feb 14, 2023   1349 EKG independently reviewed myself and shows junctional rhythm with a rate of 64 bpm.  QTc of 470.  Left axis deviation.  No significant ST elevations or depressions.  No obvious signs of acute ischemia at this time.   1418 CBC with normal WBC, hemoglobin, platelets.   1448 CMP with potassium 4.9, creatinine of 3.5.  Baseline is around 2.3-3.   1449 BNP(!): 838.30  BNP elevated from baseline.   1452 hsTroponin I(!!): 80  Troponin is elevated.  Has been elevated in the past.  Will get a repeat.   1452 Chest x-ray independently reviewed myself and spouse bilateral hazy opacifications.  Appears to have bilateral pleural effusions.  Also appears to have some pulmonary congestion.   1455 Negative COVID-19, influenza, RSV.   1554 Given evidence of volume overload, will plan to give a dose of IV Lasix.  Will plan to admit patient given that she has AKI and need for [name].   78 Spoke with hospitalist who will come and evaluate the patient for admission.     ____________________________________________    The case was discussed with the attending physician, who is in agreement with the above assessment and plan.  History     Chief Complaint  Chief Complaint   Patient presents with    Shortness of Breath       HPI   Barbara Huber is a 82 y.o. female with past medical history as below who presents with increased dyspnea on exertion since last night in the setting of a fall. The patient reports falling yesterday morning when turning around with her walker. Positive head strike, no LOC. She notes left hip pain radiating to her back and groin currently. She further notes dyspnea on exertion and difficulty ambulating, stating it takes much longer for her to ambulate than at baseline. She uses a walker at baseline. Daughter at bedside reports they slightly increased her home O2 as her satts have been running in the high 80s and low 90s at home. She has used her inhalers with some relief. She is not on blood thinners. Denies cough, fevers, chills, chest pain, leg swelling.    Outside Historian(s): I have obtained additional history/collateral from daughter.    External Records Reviewed: I have reviewed recent and relevant previous record, including: Outpatient notes - 02/10/23 Cardiology note for PMH.    Past Medical History:   Diagnosis Date    Acute kidney injury superimposed on chronic kidney disease (CMS-HCC) 10/11/2020    Acute on chronic diastolic (congestive) heart failure (CMS-HCC) 08/23/2020    AKI (acute kidney injury) (CMS-HCC) 04/14/2015    Lab Results  Component  Value  Date     CREATININE  1.90 (H)  06/12/2021     Had a bump in her creatinine when she was taking her diuretics every day.  She is currently taking 40 mg daily of torsemide and 50 mg of spironolactone.  Her volume status is fragile.  Previously when she stopped her diuretic she becomes short of breath.  Plan: We will check her BMP today.  We will likely have to go to 40    Arthritis     Calculus of kidney     Calculus of ureter     CHF (congestive heart failure) (CMS-HCC)     Chronic atrial fibrillation (CMS-HCC) 07/20/2019    COPD (chronic obstructive pulmonary disease) (CMS-HCC)     Diabetes (CMS-HCC)     Gangrenous cholecystitis 10/11/2020    Generalized edema  06/17/2021    GERD (gastroesophageal reflux disease)     Hydronephrosis     Hypertension     Hyponatremia 10/11/2020    Intermediate coronary syndrome (CMS-HCC) 03/13/2014    Lower extremity edema 09/28/2020    Lumbar stenosis     Microscopic hematuria     Nausea alone     Nephrolithiasis 04/17/2016    Neuropathy     Nocturia     Nocturia 07/01/2017    Other chronic cystitis     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  See plans for anemia, AKI  We are also tapering her gabapentin.  Currently on 300 mg 3 times daily.  We will decrease it to twice daily, and then nightly.  She states she is not having any recurrence of her pain.    Pulmonary hypertension (CMS-HCC)     Renal colic     Shortness of breath 04/28/2020    Sleep apnea     Unstable angina pectoris (CMS-HCC) 03/13/2014       Past Surgical History:   Procedure Laterality Date    BACK SURGERY  1995    CARPAL TUNNEL RELEASE Left 2014  HYSTERECTOMY  1971    IR INSERT CHOLECYSTOSMY TUBE PERCUTANEOUS  10/02/2020    IR INSERT CHOLECYSTOSMY TUBE PERCUTANEOUS 10/02/2020 Braulio Conte, MD IMG VIR H&V [facility]    LUMBAR DISC SURGERY      PR REMOVAL GALLBLADDER N/A 10/06/2020    Procedure: CHOLECYSTECTOMY;  Surgeon: Katherina Mires, MD;  Location: MAIN OR [facility];  Service: Trauma    PR RIGHT HEART CATH O2 SATURATION & CARDIAC OUTPUT N/A 09/30/2020    Procedure: Right Heart Catheterization;  Surgeon: Neal Dy, MD;  Location: [facility] CATH;  Service: Cardiology    PR RIGHT HEART CATH O2 SATURATION & CARDIAC OUTPUT N/A 01/09/2021    Procedure: Right Heart Catheterization;  Surgeon: Lesle Reek, MD;  Location: [facility] CATH;  Service: Cardiology       Allergies  Nitrofurantoin and Lipitor [atorvastatin]    Family History  Family History   Problem Relation Age of Onset    Hypertension Mother     Cancer Father         COLON CANCER    Anesthesia problems Neg Hx     Broken bones Neg Hx     Clotting disorder Neg Hx     Collagen disease Neg Hx     Diabetes Neg Hx     Dislocations Neg Hx     Fibromyalgia Neg Hx     Gout Neg Hx     Hemophilia Neg Hx     Osteoporosis Neg Hx     Rheumatologic disease Neg Hx     Scoliosis Neg Hx     Severe sprains Neg Hx     Sickle cell anemia Neg Hx     Spinal Compression Fracture Neg Hx     GU problems Neg Hx     Kidney cancer Neg Hx     Prostate cancer Neg Hx        Social History  Social History     Tobacco Use    Smoking status: Former     Current packs/day: 0.00     Types: Cigarettes     Quit date: 2019     Years since quitting: 5.1    Smokeless tobacco: Never    Tobacco comments:     Quit a few years ago   Vaping Use    Vaping status: Never Used   Substance Use Topics    Alcohol use: No    Drug use: No        Physical Exam     VITAL SIGNS:      Vitals:    02/14/23 1335   BP: 108/70   Pulse: 67   Resp: 16   Temp: 36.5 ??C (97.7 ??F)   SpO2: 93%     Constitutional: Alert and oriented. No acute distress.  Eyes: Conjunctivae are normal.  HEENT: Normocephalic and atraumatic. Conjunctivae clear. No congestion. Moist mucous membranes.   Cardiovascular: Rate as above, regular rhythm. Normal and symmetric distal pulses. Brisk capillary refill. Normal skin turgor.  Respiratory: Normal respiratory effort. Breath sounds are normal. There are no wheezing or crackles heard.  Gastrointestinal: Soft, non-distended, non-tender.  Genitourinary: Deferred.  Musculoskeletal: Normal range of motion in all extremities. Left hip tenderness to palpation. No midline spinal tenderness, step-offs, or deformities. Left lumbar paraspinal tenderness.  Neurologic: Normal speech and language. No gross focal neurologic deficits are appreciated. Patient is moving all extremities equally, face is symmetric at rest and with speech.  Skin: Skin is warm, dry and intact. No  rash noted.  Psychiatric: Mood and affect are normal. Speech and behavior are normal.     Radiology     XR Trauma Hip Left   Final Result   Small left knee joint effusion. No displaced left knee fractures. Diffuse osteopenia noted. Diffuse soft tissue vascular calcification seen. Mild medial compartment generative change.   2. No fractures mid and distal shaft of left femur. Femoral neck and hip joint not well evaluated on the present study. On a single view, no displaced fractures identified. However, based on clinical concern, consider cross-sectional imaging for better evaluation and to exclude underlying fractures.      XR Chest 2 views   Preliminary Result      Cardiomegaly.      Bilateral mid and lower lung opacities, likely moderate pulmonary edema. Small bilateral pleural effusions.          Pertinent labs & imaging results that were available during my care of the patient were independently interpreted by me and considered in my medical decision making (see chart for details).    Portions of this record have been created using Scientist, clinical (histocompatibility and immunogenetics). Dictation errors have been sought, but may not have been identified and corrected.    Documentation assistance was provided by Gus Height, Scribe on February 14, 2023 at 1:51 PM for Francis Dowse, MD.     Documentation assistance provided by the above mentioned scribe. I was present during the time the encounter was recorded. The information recorded by the scribe was done at my direction and has been reviewed and validated by me.          Francis Dowse, MD  Resident  02/14/23 [phone]

## 2023-02-14 NOTE — Unmapped (Signed)
Pt Reports SOB from today that gets worse on exertion. H/o COPD and home 02 3L at home. Also reports a fall yesterday on her L side. Hit head onto wooden floor. Denies LOC or taking blood thinners. C/o pain to L groin.

## 2023-02-15 LAB — CBC W/ AUTO DIFF
BASOPHILS ABSOLUTE COUNT: 0.1 10*9/L (ref 0.0–0.1)
BASOPHILS RELATIVE PERCENT: 0.7 %
EOSINOPHILS ABSOLUTE COUNT: 0.4 10*9/L (ref 0.0–0.5)
EOSINOPHILS RELATIVE PERCENT: 3.6 %
HEMATOCRIT: 39.9 % (ref 34.0–44.0)
HEMOGLOBIN: 13 g/dL (ref 11.3–14.9)
LYMPHOCYTES ABSOLUTE COUNT: 1 10*9/L — ABNORMAL LOW (ref 1.1–3.6)
LYMPHOCYTES RELATIVE PERCENT: 9.6 %
MEAN CORPUSCULAR HEMOGLOBIN CONC: 32.5 g/dL (ref 32.0–36.0)
MEAN CORPUSCULAR HEMOGLOBIN: 29.7 pg (ref 25.9–32.4)
MEAN CORPUSCULAR VOLUME: 91.6 fL (ref 77.6–95.7)
MEAN PLATELET VOLUME: 9.5 fL (ref 6.8–10.7)
MONOCYTES ABSOLUTE COUNT: 1.1 10*9/L — ABNORMAL HIGH (ref 0.3–0.8)
MONOCYTES RELATIVE PERCENT: 10.5 %
NEUTROPHILS ABSOLUTE COUNT: 7.9 10*9/L — ABNORMAL HIGH (ref 1.8–7.8)
NEUTROPHILS RELATIVE PERCENT: 75.6 %
NUCLEATED RED BLOOD CELLS: 0 /100{WBCs} (ref <=4)
PLATELET COUNT: 143 10*9/L — ABNORMAL LOW (ref 150–450)
RED BLOOD CELL COUNT: 4.36 10*12/L (ref 3.95–5.13)
RED CELL DISTRIBUTION WIDTH: 13.3 % (ref 12.2–15.2)
WBC ADJUSTED: 10.5 10*9/L (ref 3.6–11.2)

## 2023-02-15 LAB — HIGH SENSITIVITY TROPONIN I - SINGLE: HIGH SENSITIVITY TROPONIN I: 84 ng/L (ref <=34)

## 2023-02-15 LAB — BASIC METABOLIC PANEL
ANION GAP: 11 mmol/L (ref 5–14)
ANION GAP: 9 mmol/L (ref 5–14)
BLOOD UREA NITROGEN: 93 mg/dL — ABNORMAL HIGH (ref 9–23)
BLOOD UREA NITROGEN: 94 mg/dL — ABNORMAL HIGH (ref 9–23)
BUN / CREAT RATIO: 27
BUN / CREAT RATIO: 26
CALCIUM: 8.9 mg/dL (ref 8.7–10.4)
CALCIUM: 9.3 mg/dL (ref 8.7–10.4)
CHLORIDE: 101 mmol/L (ref 98–107)
CHLORIDE: 99 mmol/L (ref 98–107)
CO2: 24.4 mmol/L (ref 20.0–31.0)
CO2: 25.9 mmol/L (ref 20.0–31.0)
CREATININE: 3.5 mg/dL — ABNORMAL HIGH
CREATININE: 3.66 mg/dL — ABNORMAL HIGH
EGFR CKD-EPI (2021) FEMALE: 13 mL/min/{1.73_m2} — ABNORMAL LOW (ref >=60)
EGFR CKD-EPI (2021) FEMALE: 12 mL/min/{1.73_m2} — ABNORMAL LOW (ref >=60)
GLUCOSE RANDOM: 189 mg/dL — ABNORMAL HIGH (ref 70–179)
GLUCOSE RANDOM: 152 mg/dL (ref 70–179)
POTASSIUM: 4.6 mmol/L (ref 3.4–4.8)
POTASSIUM: 4.8 mmol/L (ref 3.4–4.8)
SODIUM: 136 mmol/L (ref 135–145)
SODIUM: 134 mmol/L — ABNORMAL LOW (ref 135–145)

## 2023-02-15 LAB — URINALYSIS WITH MICROSCOPY
BILIRUBIN UA: NEGATIVE
BLOOD UA: NEGATIVE
GLUCOSE UA: NEGATIVE
HYALINE CASTS: 102 /LPF — ABNORMAL HIGH (ref 0–1)
KETONES UA: NEGATIVE
NITRITE UA: NEGATIVE
PH UA: 5 (ref 5.0–9.0)
PROTEIN UA: NEGATIVE
RBC UA: 3 /HPF (ref <=4)
SPECIFIC GRAVITY UA: 1.014 (ref 1.003–1.030)
SQUAMOUS EPITHELIAL: 10 /HPF — ABNORMAL HIGH (ref 0–5)
UROBILINOGEN UA: <2
WBC UA: 28 /HPF — ABNORMAL HIGH (ref 0–5)

## 2023-02-15 LAB — MAGNESIUM
MAGNESIUM: 2.8 mg/dL — ABNORMAL HIGH (ref 1.6–2.6)
MAGNESIUM: 2.7 mg/dL — ABNORMAL HIGH (ref 1.6–2.6)

## 2023-02-15 LAB — B-TYPE NATRIURETIC PEPTIDE: B-TYPE NATRIURETIC PEPTIDE: 697.32 pg/mL — ABNORMAL HIGH (ref <=100)

## 2023-02-15 LAB — TSH: THYROID STIMULATING HORMONE: 2.196 u[IU]/mL (ref 0.550–4.780)

## 2023-02-15 MED ADMIN — oxyCODONE (OxyCONTIN) 12 hr crush resistant ER/CR tablet 10 mg: 10 mg | ORAL | @ 14:00:00 | Stop: 2023-02-28

## 2023-02-15 MED ADMIN — fluticasone furoate-vilanterol (BREO ELLIPTA) 200-25 mcg/dose inhaler 1 puff: 1 | RESPIRATORY_TRACT | @ 13:00:00

## 2023-02-15 MED ADMIN — heparin (porcine) 5,000 unit/mL injection 5,000 Units: 5000 [IU] | SUBCUTANEOUS | @ 20:00:00

## 2023-02-15 MED ADMIN — senna (SENOKOT) tablet 1 tablet: 1 | ORAL | @ 02:00:00

## 2023-02-15 MED ADMIN — amiodarone (PACERONE) tablet 200 mg: 200 mg | ORAL | @ 14:00:00

## 2023-02-15 MED ADMIN — umeclidinium (INCRUSE ELLIPTA) 62.5 mcg/actuation inhaler 1 puff: 1 | RESPIRATORY_TRACT | @ 13:00:00

## 2023-02-15 MED ADMIN — polyethylene glycol (MIRALAX) packet 17 g: 17 g | ORAL | @ 01:00:00

## 2023-02-15 MED ADMIN — heparin (porcine) 5,000 unit/mL injection 5,000 Units: 5000 [IU] | SUBCUTANEOUS | @ 02:00:00

## 2023-02-15 MED ADMIN — gabapentin (NEURONTIN) capsule 300 mg: 300 mg | ORAL | @ 14:00:00

## 2023-02-15 MED ADMIN — furosemide (LASIX) injection 80 mg: 80 mg | INTRAVENOUS | @ 15:00:00 | Stop: 2023-02-15

## 2023-02-15 MED ADMIN — montelukast (SINGULAIR) tablet 10 mg: 10 mg | ORAL | @ 14:00:00

## 2023-02-15 MED ADMIN — pantoprazole (Protonix) EC tablet 20 mg: 20 mg | ORAL | @ 14:00:00

## 2023-02-15 MED ADMIN — tafamidis (VYNDAMAX) 61 mg capsule **Patient Supplied**: 1 | ORAL | @ 14:00:00

## 2023-02-15 MED ADMIN — insulin glargine (LANTUS) injection: 4 [IU] | SUBCUTANEOUS | @ 02:00:00

## 2023-02-15 MED ADMIN — oxyCODONE (OxyCONTIN) 12 hr crush resistant ER/CR tablet 10 mg: 10 mg | ORAL | @ 02:00:00 | Stop: 2023-02-28

## 2023-02-15 MED ADMIN — gabapentin (NEURONTIN) capsule 300 mg: 300 mg | ORAL | @ 01:00:00

## 2023-02-15 MED ADMIN — polyethylene glycol (MIRALAX) packet 17 g: 17 g | ORAL | @ 14:00:00

## 2023-02-15 MED ADMIN — heparin (porcine) 5,000 unit/mL injection 5,000 Units: 5000 [IU] | SUBCUTANEOUS | @ 11:00:00

## 2023-02-15 MED ADMIN — azithromycin (ZITHROMAX) tablet 500 mg: 500 mg | ORAL | @ 01:00:00 | Stop: 2023-02-14

## 2023-02-15 MED ADMIN — spironolactone (ALDACTONE) tablet 25 mg: 25 mg | ORAL | @ 14:00:00

## 2023-02-15 NOTE — Unmapped (Signed)
ORTHOPAEDIC TREATMENT PLAN     The [facility] ED called regarding this patient requesting E-consult for evaluation of pertinent findings and further orthopedic surgery recommendations. This patient was not physically evaluated as they are currently at [facility] and consulting orthopedic service is at [facility]. The following consult is in consideration of ED providers reported history, clinical exam findings, imaging, labs, and any other pertinent factors pertaining to this patient.    SUBJECTIVE     Per the outside provider, Barbara Huber is a 82 y.o. female with a PMH of COPD, CHF, T2DM on insulin (last A1C 5.6), [name] 3 who presented to the [facility] yesterday s/p GLF. Per the providers documentation, the patient fell while trying to turn with her walker. Pain is isolated to the left hip.     Patient lives at home alone    OBJECTIVE     Vitals:   Vitals:    02/15/23 0756   BP: 135/64   Pulse: 70   Resp: 19   Temp: 36.7 ??C (98.1 ??F)   SpO2: 91%       Outside provider's reported physical exam findings:     LLE: No obvious deformity. Skin intact. + GS/TA/EHL. SILT in DP/SP/S/S/T distributions.  2+ DP pulse with warm and well perfused toes.    Relevant Imaging:   Radiology studies were personally reviewed.    XR of left hip with inferior and superior rami fractures    Labs:   All lab results last 24 hours:    Recent Results (from the past 24 hour(s))   ECG 12 Lead    Collection Time: 02/14/23  1:32 PM   Result Value Ref Range    EKG Systolic BP  mmHg    EKG Diastolic BP  mmHg    EKG Ventricular Rate 64 BPM    EKG Atrial Rate  BPM    EKG P-R Interval  ms    EKG QRS Duration 114 ms    EKG Q-T Interval 456 ms    EKG QTC Calculation 470 ms    EKG Calculated P Axis  degrees    EKG Calculated R Axis -68 degrees    EKG Calculated T Axis 60 degrees    QTC Fredericia 465 ms   RAPID INFLUENZA/RSV/COVID PCR    Collection Time: 02/14/23  2:01 PM    Specimen: Nasopharyngeal Swab   Result Value Ref Range SARS-CoV-2 PCR Negative Negative    Influenza A Negative Negative    Influenza B Negative Negative    RSV Negative Negative   Comprehensive Metabolic Panel    Collection Time: 02/14/23  2:08 PM   Result Value Ref Range    Sodium 135 135 - 145 mmol/L    Potassium 4.9 (H) 3.4 - 4.8 mmol/L    Chloride 101 98 - 107 mmol/L    CO2 25.3 20.0 - 31.0 mmol/L    Anion Gap 9 5 - 14 mmol/L    BUN 92 (H) 9 - 23 mg/dL    Creatinine 1.61 (H) 0.55 - 1.02 mg/dL    BUN/Creatinine Ratio 26     eGFR [name]-EPI (2021) Female 13 (L) >=60 mL/min/1.49m2    Glucose 203 (H) 70 - 179 mg/dL    Calcium 9.1 8.7 - 09.6 mg/dL    Albumin 3.7 3.4 - 5.0 g/dL    Total Protein 8.0 5.7 - 8.2 g/dL    Total Bilirubin 0.6 0.3 - 1.2 mg/dL    AST 19 <=04 U/L  ALT 9 (L) 10 - 49 U/L    Alkaline Phosphatase 104 46 - 116 U/L   B-type natriuretic peptide (BNP)    Collection Time: 02/14/23  2:08 PM   Result Value Ref Range    BNP 838.30 (H) <=100 pg/mL   HS Troponin 0h    Collection Time: 02/14/23  2:08 PM   Result Value Ref Range    hsTroponin I 80 (HH) <=34 ng/L   CBC w/ Differential    Collection Time: 02/14/23  2:08 PM   Result Value Ref Range    WBC 11.0 3.6 - 11.2 10*9/L    RBC 4.48 3.95 - 5.13 10*12/L    HGB 13.3 11.3 - 14.9 g/dL    HCT 09.8 11.9 - 14.7 %    MCV 91.8 77.6 - 95.7 fL    MCH 29.6 25.9 - 32.4 pg    MCHC 32.3 32.0 - 36.0 g/dL    RDW 82.9 56.2 - 13.0 %    MPV 9.3 6.8 - 10.7 fL    Platelet 159 150 - 450 10*9/L    nRBC 0 <=4 /100 WBCs    Neutrophils % 79.4 %    Lymphocytes % 8.9 %    Monocytes % 9.5 %    Eosinophils % 1.4 %    Basophils % 0.8 %    Absolute Neutrophils 8.7 (H) 1.8 - 7.8 10*9/L    Absolute Lymphocytes 1.0 (L) 1.1 - 3.6 10*9/L    Absolute Monocytes 1.0 (H) 0.3 - 0.8 10*9/L    Absolute Eosinophils 0.2 0.0 - 0.5 10*9/L    Absolute Basophils 0.1 0.0 - 0.1 10*9/L   hsTroponin I (serial 2-6H CONTINUATION w/ delta)    Collection Time: 02/14/23  5:15 PM   Result Value Ref Range    hsTroponin I 88 (HH) <=34 ng/L    delta hsTroponin I 8 (H) <=7 ng/L   Urinalysis with Microscopy with Culture Reflex    Collection Time: 02/14/23  7:09 PM    Specimen: Clean Catch; Urine   Result Value Ref Range    Color, UA Light Yellow     Clarity, UA Clear     Specific Gravity, UA 1.011 1.003 - 1.030    pH, UA 5.0 5.0 - 9.0    Leukocyte Esterase, UA Trace (A) Negative    Nitrite, UA Negative Negative    Protein, UA Negative Negative    Glucose, UA Negative Negative    Ketones, UA Negative Negative    Urobilinogen, UA <2.0 mg/dL <8.6 mg/dL    Bilirubin, UA Negative Negative    Blood, UA Negative Negative    RBC, UA <1 <=4 /HPF    WBC, UA 4 0 - 5 /HPF    Squam Epithel, UA 2 0 - 5 /HPF    Bacteria, UA Moderate (A) None Seen /HPF    Hyaline Casts, UA 26 (H) 0 - 1 /LPF    Mucus, UA Rare (A) None Seen /HPF   hsTroponin I - 6 Hour    Collection Time: 02/14/23  8:35 PM   Result Value Ref Range    hsTroponin I 83 (HH) <=34 ng/L    delta hsTroponin I 5 <=7 ng/L   POCT Glucose    Collection Time: 02/14/23  8:40 PM   Result Value Ref Range    Glucose, POC 159 70 - 179 mg/dL   Basic Metabolic Panel    Collection Time: 02/15/23  6:12 AM   Result Value Ref Range  Sodium 136 135 - 145 mmol/L    Potassium 4.6 3.4 - 4.8 mmol/L    Chloride 101 98 - 107 mmol/L    CO2 24.4 20.0 - 31.0 mmol/L    Anion Gap 11 5 - 14 mmol/L    BUN 93 (H) 9 - 23 mg/dL    Creatinine 1.61 (H) 0.55 - 1.02 mg/dL    BUN/Creatinine Ratio 27     eGFR [name]-EPI (2021) Female 13 (L) >=60 mL/min/1.102m2    Glucose 189 (H) 70 - 179 mg/dL    Calcium 8.9 8.7 - 09.6 mg/dL   Magnesium Level    Collection Time: 02/15/23  6:12 AM   Result Value Ref Range    Magnesium 2.8 (H) 1.6 - 2.6 mg/dL   B-type Natriuretic Peptide    Collection Time: 02/15/23  6:12 AM   Result Value Ref Range    BNP 697.32 (H) <=100 pg/mL   hsTroponin I (single, no delta)    Collection Time: 02/15/23  6:12 AM   Result Value Ref Range    hsTroponin I 84 (HH) <=34 ng/L   CBC w/ Differential    Collection Time: 02/15/23  6:12 AM   Result Value Ref Range    WBC 10.5 3.6 - 11.2 10*9/L    RBC 4.36 3.95 - 5.13 10*12/L    HGB 13.0 11.3 - 14.9 g/dL    HCT 04.5 40.9 - 81.1 %    MCV 91.6 77.6 - 95.7 fL    MCH 29.7 25.9 - 32.4 pg    MCHC 32.5 32.0 - 36.0 g/dL    RDW 91.4 78.2 - 95.6 %    MPV 9.5 6.8 - 10.7 fL    Platelet 143 (L) 150 - 450 10*9/L    nRBC 0 <=4 /100 WBCs    Neutrophils % 75.6 %    Lymphocytes % 9.6 %    Monocytes % 10.5 %    Eosinophils % 3.6 %    Basophils % 0.7 %    Absolute Neutrophils 7.9 (H) 1.8 - 7.8 10*9/L    Absolute Lymphocytes 1.0 (L) 1.1 - 3.6 10*9/L    Absolute Monocytes 1.1 (H) 0.3 - 0.8 10*9/L    Absolute Eosinophils 0.4 0.0 - 0.5 10*9/L    Absolute Basophils 0.1 0.0 - 0.1 10*9/L   TSH    Collection Time: 02/15/23  6:12 AM   Result Value Ref Range    TSH 2.196 0.550 - 4.780 uIU/mL       ASSESSMENT AND PLAN     Left pubic rami fractures  -Operative treatment is not anticipated  -Weight Bearing Status/Activity: weightbearing as tolerated to LLE  -Recommended PT/OT  -Will order pelvis xrays now. Recommend post-mobilization pelvis xrays  -Follow-up plan: Orthopaedic surgery will arrange outpatient follow-up.      Erik Obey, FNP-BC  [facility]  [phone]    *The history and physical examination of the patient is included as reported by the outside provider. I was not present to examine this patient.

## 2023-02-15 NOTE — Unmapped (Signed)
OCCUPATIONAL THERAPY  Evaluation (02/15/23 0826)    Patient Name:  Barbara Huber       Medical Record Number: [id number]   Date of Birth: [birth year]  Sex: Female            OT Treatment Diagnosis:  Decreased ability to perform self-care tasks, here after a fall    Assessment    Problem List: Decreased endurance, Fall risk, Impaired balance, Impaired ADLs, Decreased safety awareness, Decreased strength, Decreased mobility, Pain, Orthopedic restrictions    Personal Factors/Comorbidities (Occupational Profile and History Review): Expanded (Moderate)    Assessment of Occupational Performance : Balance, Mobility, Strength    Clinical Decision Making: Moderate Complexity    Assessment: Pt presents to [facility] here as a 82 y.o. female who is presenting to [facility] with Dyspepsia, in the setting of the following pertinent/contributing co-morbidities: COPD, CHF, s/p fall with left hip pain per H&P.     Pt presents with increased pain/discomfort in LLE after recent fall, all impeding her ability to perform self-care tasks and mobility, given new NWB status. Pt is normally quite independent with use of her rollator and had this recent fall in the kitchen. Pt received initially sitting on edge of bed trying to get to the bathroom, needing physical assistance and verbal cues for safer options given limitations. Pt benefits from skilled OT services in order to maximize ADL's and ensure safety with all transfers/tasks.     Today's Interventions: Education - Patient on OT role/POC, NWB status on LLE, safe mobility to [facility] for toileting needs (AMPAC: 16/24)     Activity Tolerance During Today's Session  Limited by pain    Plan  Planned Frequency of Treatment:  1-2x per day for: 3-4x week       Planned Interventions:  Therapeutic Exercise, Therapeutic Activity, Self-Care/Home Training, Home Exercise Program      Post-Discharge Occupational Therapy Recommendations:   5x weekly, Low intensity   OT DME Recommendations: Defer to post acute -        GOALS:        Short Term:  SHORT GOAL #1: pt will perform BSC t/f and hygiene with Min A with LRAD   Time Frame : 2 weeks  SHORT GOAL #2: pt will perform full body dressing with Min A with AE and LRAD   Time Frame : 2 weeks  SHORT GOAL #3: pt will perform 2+ grooming tasks, seated at sink, with set-up   Time Frame : 2 weeks      Prognosis:  Good  Positive Indicators:  supportive daughter  Barriers to Discharge: Endurance deficits, Inability to safely perform ADLS, Impaired Balance, Severity of deficits, Pain, Functional strength deficits    Subjective  Medical Updates Since Last Visit:    Prior Functional Status rollator for ADL's, chores, likes word searches, enjoys watching TV    Medical Tests / Procedures: Impression:  Nondisplaced fractures involving the left superior and inferior pubic rami.  Services patient receives prior to admission: OT, PT    Patient / Caregiver reports: She likes word searches!    Past Medical History:   Diagnosis Date    Acute kidney injury superimposed on chronic kidney disease (CMS-HCC) 10/11/2020    Acute on chronic diastolic (congestive) heart failure (CMS-HCC) 08/23/2020    AKI (acute kidney injury) (CMS-HCC) 04/14/2015    Lab Results  Component  Value  Date     CREATININE  1.90 (H)  06/12/2021     Had a bump  in her creatinine when she was taking her diuretics every day.  She is currently taking 40 mg daily of torsemide and 50 mg of spironolactone.  Her volume status is fragile.  Previously when she stopped her diuretic she becomes short of breath.  Plan: We will check her BMP today.  We will likely have to go to 40    Arthritis     Calculus of kidney     Calculus of ureter     CHF (congestive heart failure) (CMS-HCC)     Chronic atrial fibrillation (CMS-HCC) 07/20/2019    COPD (chronic obstructive pulmonary disease) (CMS-HCC)     Diabetes (CMS-HCC)     Gangrenous cholecystitis 10/11/2020    Generalized edema  06/17/2021    GERD (gastroesophageal reflux disease)     Hydronephrosis     Hypertension     Hyponatremia 10/11/2020    Intermediate coronary syndrome (CMS-HCC) 03/13/2014    Lower extremity edema 09/28/2020    Lumbar stenosis     Microscopic hematuria     Nausea alone     Nephrolithiasis 04/17/2016    Neuropathy     Nocturia     Nocturia 07/01/2017    Other chronic cystitis     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  See plans for anemia, AKI  We are also tapering her gabapentin.  Currently on 300 mg 3 times daily.  We will decrease it to twice daily, and then nightly.  She states she is not having any recurrence of her pain.    Pulmonary hypertension (CMS-HCC)     Renal colic     Shortness of breath 04/28/2020    Sleep apnea     Unstable angina pectoris (CMS-HCC) 03/13/2014    Social History     Tobacco Use    Smoking status: Former     Current packs/day: 0.00     Types: Cigarettes     Quit date: 2019     Years since quitting: 5.1    Smokeless tobacco: Never    Tobacco comments:     Quit a few years ago   Substance Use Topics    Alcohol use: No      Past Surgical History:   Procedure Laterality Date    BACK SURGERY  1995    CARPAL TUNNEL RELEASE Left 2014    HYSTERECTOMY  1971    IR INSERT CHOLECYSTOSMY TUBE PERCUTANEOUS  10/02/2020    IR INSERT CHOLECYSTOSMY TUBE PERCUTANEOUS 10/02/2020 Braulio Conte, MD IMG VIR [facility]    LUMBAR DISC SURGERY      PR REMOVAL GALLBLADDER N/A 10/06/2020    Procedure: CHOLECYSTECTOMY;  Surgeon: Katherina Mires, MD;  Location: MAIN OR [facility];  Service: Trauma    PR RIGHT HEART CATH O2 SATURATION & CARDIAC OUTPUT N/A 09/30/2020    Procedure: Right Heart Catheterization;  Surgeon: Neal Dy, MD;  Location: [facility] CATH;  Service: Cardiology    PR RIGHT HEART CATH O2 SATURATION & CARDIAC OUTPUT N/A 01/09/2021    Procedure: Right Heart Catheterization;  Surgeon: Lesle Reek, MD;  Location: [facility] CATH;  Service: Cardiology    Family History Problem Relation Age of Onset    Hypertension Mother     Cancer Father         COLON CANCER    Anesthesia problems Neg Hx     Broken bones Neg Hx     Clotting disorder Neg Hx  Collagen disease Neg Hx     Diabetes Neg Hx     Dislocations Neg Hx     Fibromyalgia Neg Hx     Gout Neg Hx     Hemophilia Neg Hx     Osteoporosis Neg Hx     Rheumatologic disease Neg Hx     Scoliosis Neg Hx     Severe sprains Neg Hx     Sickle cell anemia Neg Hx     Spinal Compression Fracture Neg Hx     GU problems Neg Hx     Kidney cancer Neg Hx     Prostate cancer Neg Hx         Nitrofurantoin and Lipitor [atorvastatin]     Objective Findings  Precautions / Restrictions  (new pubic rami fx's)     Weight Bearing  LLE NWB - Non weight bearing    Required Braces or Orthoses  Non-applicable    Communication Preference  Verbal    Pain  had discomfort, unable to rate right now, notified RN    Equipment / Environment  Vascular access (PIV, TLC, Port-a-cath, PICC), Telemetry, Supplemental oxygen (4L 02 )    Living Situation  Living Environment: House  Lives With: Daughter, Family  Home Living: One level home, Walk-in shower, Handicapped height toilet, Grab bars in shower, Built-in shower seat  Equipment available at home: Rolling walker, Rollator (cane)     Cognition   Orientation Level:  Appropriate for age   Arousal/Alertness:  Appropriate for age   Attention Span:  Appears intact   Memory:  Appears intact   Following Commands:  Follows one step commands with repetition   Safety Judgment:  Decreased awareness of need for safety   Awareness of Errors:      Problem Solving:  Assistance required to identify errors made   Comments:      Vision / Hearing   Vision: Wears glasses all the time     Hearing: No deficit identified         Hand Function:  Right Hand Function: Right hand grip strength, ROM and coordination WNL  Left Hand Function: Left hand grip strength, ROM and coordination WNL  Hand Dominance: Right    Skin Inspection:  Skin Inspection: Intact where visualized    ROM / Strength:  UE ROM/Strength: Right Impaired/Limited, Left Impaired/Limited  RUE Impairment: Reduced strength  LUE Impairment: Reduced strength  LE ROM/Strength: Left Impaired/Limited, Right Impaired/Limited  RLE Impairment: Reduced strength  LLE Impairment: Reduced strength    Coordination:  Coordination: WFL    Sensation:  RLE Sensation: RLE intact  LLE Sensation: LLE intact  Sensory/ Proprioception/ Stereognosis comments: history of neuropathy in her feet    Balance:  Animal nutritionist of Assistance: Supervision  Dynamic Sitting-Level of Assistance: Clinical biochemist Standing-Level of Assistance: Minimum assistance    Functional Mobility  Transfers: Mod assist  Bed Mobility - Needs Assistance:  (received sitting edge of bed)  Ambulation: ~2-3 feet, Mod A with RW, Mod A overall with RW, CNA assisting with watching LLE, frequent verbal cues to off-load weight, graded to stand pivot t/f     ADLs  ADLs: Needs assistance with ADLs  ADLs - Needs Assistance: Toileting, UB dressing, Feeding  Feeding - Needs Assistance: Set Up Assist in recliner to eat breakfast   Toileting - Needs Assistance: Mod assist for transfer, via stand pivot, frequent verbal cues for safety with NWB status   UB Dressing - Needs Assistance: Set  Up Assist for changing into hospital gown     Vitals / Orthostatics  Vitals/Orthostatics: 91% Sp02 on 4L [place]    Patient at end of session: All needs in reach, In chair, Friends/Family present, Nurse notified     Occupational Therapy Session Duration  OT Individual [mins]: 30       AM-PAC-Daily Activity  Lower Body Dressing assistance needs: A lot - Maximum/Moderate Assistance  Bathing assistance needs: A lot - Maximum/Moderate Assistance  Toileting assistance needs: A lot - Maximum/Moderate Assistance  Upper Body Dressing assistance needs: A Little - Minimal/Contact Guard Assist/Supervision  Personal Grooming assistance needs: A Little - Minimal/Contact Guard Assist/Supervision  Eating Meals assistance needs: None - Modified Independent/Independent    Daily Activity Score:  Daily Activity Score: 16    Score (in points): % of Functional Impairment, Limitation, Restriction  6: 100% impaired, limited, restricted  7-8: At least 80%, but less than 100% impaired, limited restricted  9-13: At least 60%, but less than 80% impaired, limited restricted  14-19: At least 40%, but less than 60% impaired, limited restricted  20-22: At least 20%, but less than 40% impaired, limited restricted  23: At least 1%, but less than 20% impaired, limited restricted  24: 0% impaired, limited restricted      I attest that I have reviewed the above information.  Signed: Bren Borys Frances Maywood, OT  Filed 02/15/2023

## 2023-02-15 NOTE — Unmapped (Addendum)
Pt got admitted for SOB and fall at home. Alert and oriented x 4. On nasal cannula at 4 LPM. On telemetry monitoring. Skin and fall precautions maintained. Verbalized plan of care. Blood sugar well controlled. Daughter at bed side. Non weight bearing on LLE. Working towards prioritized goals.    24 hour chart check completed.     Problem: Adult Inpatient Plan of Care  Goal: Plan of Care Review  Outcome: Progressing     Problem: Adult Inpatient Plan of Care  Goal: Patient-Specific Goal (Individualized)  Outcome: Progressing     Problem: Adult Inpatient Plan of Care  Goal: Absence of Hospital-Acquired Illness or Injury  Intervention: Identify and Manage Fall Risk  Recent Flowsheet Documentation  Taken 02/14/2023 2200 by Lyndal Pulley, RN  Safety Interventions:   low bed   nonskid shoes/slippers when out of bed   fall reduction program maintained   family at bedside  Taken 02/14/2023 2000 by Lyndal Pulley, RN  Safety Interventions:   fall reduction program maintained   low bed   nonskid shoes/slippers when out of bed   6CIT Score for Cognitive Screening:  Complete for all new admissions, scoring found on the back of the Kardex  [x] Normal (<8)    [] Significant (8 or higher)  [] Not yet completed this admission    CAM Delirium Screening Tool  Be sure to ask questions to gauge orientation and attention during your assessment  Feature Present? Scoring: CAM Positive if:   Acute onset (different from baseline) with Fluctuating course [] Yes    [x] No   Yes to BOTH of these AND.Marland KitchenMarland Kitchen   Inattention (days of the week or months of the year backward) [] Yes    [x] No    Disorganized thinking [] Yes    [x] No   Yes to at least   ONE of these   Altered level of consciousness  [] Yes    [x] No    CAM []  Positive  [x] Negative    CAM-S: Symptom Severity Score  Complete on all patients ?82 years old, including in patients with dementia  Do not complete if the patient is known to be non-verbal due to their dementia  Feature  Severity Score Question   ACUTE ONSET  & FLUCTUATING  COURSE [x] No (0)    [] Yes (1)    Is there evidence of an acute change in mental status from the patient's baseline? Did the patient's behavior fluctuate at any point during the interview for any of the 10 features?   2. INATTENTION [x] No (0)    [] Yes, mild (1)  [] Yes, marked (2)  Did the patient have difficulty focusing attention, for example being easily distractible, or having difficulty keeping track of what was being said?   3. DISORGANIZED  THINKING [x] No (0)    [] Yes, mild (1)  [] Yes, marked (2)  Was the patient's thinking disorganized or incoherent, such as rambling or irrelevant conversation, unclear or illogical flow or of ideas, unpredictable switching from subject to subject?   4. ALTERED LEVEL  OF  CONSCIOUSNESS [x] Normal (0)    [] Mild: vigilant or lethargic (1)  [] Marked: stupor or coma (2)    Overall, how would you rate the patient's level of consciousness?   -Alert (normal)   -Vigilant  -Lethargic  -Stupor  -Coma  -Uncertain   5. DISORIENTATION [x] No (0)    [] Yes, mild (1)  [] Yes, marked (2)  Was the patient disoriented at any time during the interview, such as thinking he/she was somewhere other than the hospital, using  the wrong bed, or misjudging the time of day?   6. MEMORY  IMPAIRMENT [x] No (0)    [] Yes, mild (1)  [] Yes, marked (2)    Did the patient demonstrate any memory problems during the interview, such as inability to remember events in the hospital or difficulty remembering instructions?   7. PERCEPTUAL  DISTURBANCES [x] No (0)    [] Yes, mild (1)  [] Yes, marked (2)  Did the patient have any evidence of perceptual disturbances, for example, hallucinations, illusions, or misinterpretations (such as thinking something was moving when it was not)?   8. PSYCHOMOTOR  AGITATION [x] No (0)    [] Yes, mild (1)  [] Yes, marked (2)  At any time during the interview, did the patient have an unusually increased level of motor activity, such as restlessness, picking at bedclothes, tapping fingers, or making frequent sudden changes of position?   9. PSYCHOMOTOR  RETARDATION [x] No (0)    [] Yes, mild (1)  [] Yes, marked (2)  At any time during the interview, did the patient have an unusually decreased level of motor activity, such as sluggishness, staring into space, staying in one position for a long time, or moving very slowly?   10. ALTERED  SLEEP-WAKE  CYCLE  [x] No (0)    [] Yes, mild (1)  [] Yes, marked (2)  Did the patient have evidence of disturbance of the sleep-wake cycle, such as excessive daytime sleepiness with insomnia at   night?   Long Form   SEVERITY SCORE:  Severity Score (add rows 1-10)  Total (0-19): 0    Scoring the CAM-S: Rate each symptom of delirium listed in the CAM instrument as absent (0), mild (1), marked (2). Acute onset or fluctuation is rated as absent (0) or present (1). Summarize these scores into a composite.

## 2023-02-15 NOTE — Unmapped (Signed)
Pt arrived to floor accompanied by family and ED nurse.  No report of distress.  Admission documentation completed.  Call bell in reach.

## 2023-02-15 NOTE — Unmapped (Signed)
Clinical Pharmacy Non-Formulary Home Medication Evaluation Note     Team is requesting continuation of the patient's non-formulary home medication Tafamidis. Per PolicyStat ID: [id number] (Medication Management: Patient-Supplied Medications,) the continued use of these medications during hospital admission was evaluated by pharmacy.      Recommendation: Continuation of non-formulary home medication Tafamidis is clinically necessary and will be identified by pharmacy for continuation. Per policy, patient-supplied medications must be supplied by the patient for the duration of the encounter.     Rationale: This medication was approved for inpatient use by Eugenia Pancoast (pharmacy administration). This approval is good for 6 months and expires on 8/18/12024     This information has been communicated to the ordering clinician. Pharmacy will continue to follow patient with team.  Please page the service pharmacist pager found in the [facility] Directory if further immediate assistance is required.    Thank you,     Tillie Rung, PharmD

## 2023-02-15 NOTE — Unmapped (Addendum)
[facility] Medicine   History and Physical       Assessment and Plan     Barbara Huber is a 82 y.o. female who is presenting to [facility] with Dyspepsia, in the setting of the following pertinent/contributing co-morbidities: COPD, CHF, s/p fall with left hip pain      Dyspepsia   Chronic illness with severe exacerbation or progression that has a significant risk of morbidity without appropriate treatment  Patient with CXR c/w pulmonary edema and she has an elevated BNP. Of note, the patient has no increased oxygen requirement and feels comfortable when she's lying down.  - patient administered IV lasix 40mg  x 1 dose in ED  - will restart patient's home torsemide and add metolazone that patient take at home as needed when she is fluid overloaded  - continue home spironolactone  - follow strict I/Os and check BNP in AM  - start home O2     Cardiac amyloidosis (CMS-HCC)      Overview: Diagnosed 10/2020 on NM Spect. Followed at [facility] cardiology      On tafamidis (will take meds as this is non-formulary)    Chronic kidney disease ([name]), stage IV (severe) (CMS-HCC)  - will start conservative diuresis as above given patient's creatinine is elevated from her baseline  - check BMP in AM  - Follows with [facility] nephrology     COPD (chronic obstructive pulmonary disease) (CMS-HCC)  - start home COPD regimen  - will start short course of azithromycin given questionable PNA on CXR      Essential hypertension      Overview: History of hypertension, now stable.       On spirinolactone 25 mg every day.Torsemide-40 mg qd                     Type 2 diabetes mellitus with stage 4 chronic kidney disease, with long-term current use of insulin (CMS-HCC)      Overview: Lab Results       Component Value Date        A1C 5.6 03/13/2022        A1C 7.0 (H) 12/06/2021        A1C 5.9 08/27/2021             Diabetes, on lantus 4 unit qhs at bedtime. Due to low GFR, off other meds.       Did not tolerate jardiance, due to UTI      BS s at home in range, we will watch for hypoglycemia      Plan: cont. Same dose      Check A1C with next in person visit, or lab visit                Peripheral neuropathy     In setting of DM and lumbar spinal stenosis history of radiculopathy,       multifactorial      Followed by Pain Management (Dr Ronita Hipps, [facility]       [place])      - On Oxycontin, , gabapentin 300 mg at bedtime (dose way down from       original dose)       Plan: cont meds                  Left hip pain  - is complaining of considerable pain   - S/P  Fall with palin films poorly visualizing hip  - check CT hip  to rule-ot fracture --> contacted by radiology at 8:40P and told patient has non-displaced fractures of both the left superior pubic rami and left inferior pubic rami. --> ortho consult in AM    Prophylaxis  -heparin sq    Diet  -Regular Diet    Code Status / HCDM   -DNR and DNI, Discussed with patient at the time of admission   -  HCDM (patient stated preference): Barbara Huber - Daughter - [phone]    Significant Comorbid Conditions:      I personally spent greater than 75 minutes face-to-face and non-face-to-face in the care of this patient, which includes all pre, intra, and post visit time on the date of service.  All documented time was specific to the E/M visit and does not include any procedures that may have been performed.    HPI    Barbara Huber is a 82 y.o. female with past medical history as below who presents with increased dyspnea on exertion since last night in the setting of a fall. The patient reports falling yesterday morning when turning around with her walker. Positive head strike, no LOC. She notes left hip pain radiating to her back and groin currently. She further notes dyspnea on exertion and difficulty ambulating, stating it takes much longer for her to ambulate than at baseline. She uses a walker at baseline. Daughter at bedside reports they slightly increased her home O2 as her satts have been running in the high 80s and low 90s at home. She has used her inhalers with some relief. She is not on blood thinners. Denies cough, fevers, chills, chest pain, leg swelling.   Of note, the daughter states that her mother was on the floor for at least 45 minutes until she arrived home and helped her off the floor.While down, the patient states that she was exerting herself try to crawl across the room to use the telephone to call for help.    ER course was notable for CXR c/w moderate pulmonary edema, but no change in baseline ome O2 requirement, elevated BNP and troponin.       Med Rec Confidence   I reviewed the Medication List. The current list is Accurate    Physical Exam   Temp:  [36.5 ??C (97.7 ??F)] 36.5 ??C (97.7 ??F)  Heart Rate:  [67] 67  Resp:  [16] 16  BP: (108)/(70) 108/70  SpO2:  [93 %] 93 %  There is no height or weight on file to calculate BMI.  General: Elderly appearing resting comfortably in bed in NAD  HEENT: PERL, anicteric sclera, moist oral mucosa  Lungs: slight rhonchi bilat. No wheezes  Heart: RRR  Abd: +BS, soft, NT/ND  Ext: +trace pedal edema bilat  Neuro A&Ox3

## 2023-02-15 NOTE — Unmapped (Signed)
Hospitalist Daily Progress Note     LOS: 0 days under inpatient status    Assessment/Plan:  Principal Problem:    Dyspepsia  Active Problems:    COPD (chronic obstructive pulmonary disease) (CMS-HCC)    Essential hypertension    Type 2 diabetes mellitus with stage 4 chronic kidney disease, with long-term current use of insulin (CMS-HCC)    Peripheral neuropathy    Chronic respiratory failure with hypoxia (CMS-HCC)    Anemia in chronic kidney disease    Chronic kidney disease ([name]), stage IV (severe) (CMS-HCC)    Acute on chronic diastolic congestive heart failure (CMS-HCC)    Chronic prescription opiate use    Cardiac amyloidosis (CMS-HCC)    Left hip pain  Resolved Problems:    * No resolved hospital problems. *         Heart with Preserved Ejection Fraction with Acute Exacerbation  Acute Hypoxemia  Cardiac amyloidosis (CMS-HCC)  Bradycardia:  Requiring 3L [place], was saturating in the low 90s, high 80s at home prior to admission. Weight is elevated to 221 from baseline of 213-214 which occurred in the span of the prior 48 hours.  BNP also increased to 840 from 110 4 days prior.  EKG on admission with junctional bradycardia, LAD, poor r-wave progression, new since last EKG in May, 2023. hsTroponin elevated but stable without a significant delta.  She has some pulmonary edema, but no evidence of peripheral edema.  She does say she typically holds onto fluid in her abdomen.  She is s/p 40 mg IV Lasix x 1  -Additional IV Lasix 80 mg x 1 and follow result  -Consult to cardiology   -Repeat echocardiogram  -Telemetry monitoring x48 hours  -Strict I/O, daily weights  -Continue home Tafamidis  -Continue home spironolactone  -Has not tolerated ARB/beta-blocker in the past.  No Jardiance due to recurrent UTIs.    Non-displaced hip fracture: CT demonstrating nondisplaced fractures of left superior and inferior pubic rami.  After discussion, etiology sounds like a mechanical fall.  No loss of consciousness, no preceding aura, palpitations, or chest pain.  -Consult orthopedic surgery  -Check vitamin D level and TSH     Atrial Fibrillation: hx of persistent afib s/p cardioversion (2022) and initiation of amiodarone.  EKG on admission with junctional rhythm.  -TSH as above  -Continue amiodarone  -Cardiology as above     Acute Kidney Injury on Chronic kidney disease ([name]), stage IV (severe) (CMS-HCC): Baseline Cr appears to be ~2.5. Cr elevated to 3.5  on admission, in the setting of heart failiure exacerbation this is likely a cardiorenal injury.   -Continue diuresis  -Strict I/O  -Daily BMP, electrolytes  - will start conservative diuresis as above given patient's creatinine is elevated from her baseline  - check BMP in AM  - Follows with [facility] nephrology     COPD (chronic obstructive pulmonary disease) (CMS-HCC)  - start home COPD regimen  - Stop azithromycin given low concern for pneumonia     Essential hypertension  - Continue spirinolactone 25 mg every day.   - Continue diuretics as above                     Type 2 diabetes mellitus with stage 4 chronic kidney disease, with long-term current use of insulin (CMS-HCC)  on lantus 4 unit qhs at bedtime. Due to low GFR, off other meds. Did not tolerate jardiance, due to UTI  -Continue home Lantus 40 units nightly  -Sliding  scale insulin        Peripheral neuropathy In setting of DM and lumbar spinal stenosis history of radiculopathy, multifactorial  -Followed by Pain Management (Dr Ronita Hipps, [facility]       [place])  -On Oxycontin, , gabapentin 300 mg at bedtime (dose way down from       original dose)   -Plan: cont meds          Daily Checklist  Diet: regular  VTE ppx: heparin  GI ppx: home use continued  Lytes: Replete PRN  IV Access: PIV  Code status: DNR/DNI  Dispo: pending above      Please page the [facility]) pager at [phone] with questions.    Consultants:   1.  Cardiology, Orthopedic surgery        Pending labs:   Pending Labs       Order Current Status    Urine Culture In process              Subjective:   She reports she is having some pain in her hip, especially with movement, relatively comfortable at rest.  No orthopnea or PND.  Does not feel short of breath.  Does not feel particularly bloated or swollen.  No fevers or chills, cough, chest pain, shortness of breath, lightheadedness, dizziness.      Objective:   Gen: Well appearing, pleasant and cooperative  HEENT: NCAT, EOMI  CV: Normal rate, regular rhythm, no murmurs, no jVP  Pulm: CTAB  GI: Abdomen soft and nontender  Extremities: Warm without edema, pedal pulses 2+ bilaterally  Neuro: normal sensation and plantar/dorsiflexion of bilateral feet    Labs/Studies:  Labs and Studies from the last 24hrs per EMR and Reviewed      ADMINISTRATIVE  Greater than 50 minutes total spent on this encounter, including preparing to see the patient, seeing and examining the patient, counseling the patient, counseling family, placing orders, discussing the patient with other providers, documenting the encounter, and coordinating care.    Data for medical decision-making today included reviewing labs, reviewing imaging, and reviewing consultants' notes.    Issues Impacting Complexity of Management:  I personally reviewed new and updated documentation. , I independently reviewed and interpreted all new lab findings most notable for those mentioned above. , I personally reviewed and interpreted CXR with small pleural effusions bilaterally and pulmonary edema., and Discussed the patient's management and/or test interpretation with cardiology and orthopedic surgery

## 2023-02-15 NOTE — Unmapped (Addendum)
DIVISION OF CARDIOLOGY  [employer], [place]        Date of Service: 02/14/2023      CARDIOLOGY INITIAL CONSULT  NOTE    Requesting Physician: Dwana Curd, MD   Requesting Service: Med Undesignated (MDX)         Barbara Huber is a 82 y.o. female with TTR cardiac amyloidosis, diastolic heart failure, persistent atrial fibrillation not on anticoagulation, chronic kidney disease, COPD, home oxygen who presents after a fall.    Assessment/Plan:    Fall   Patient denies loss of consciousness.  Notes she became unstable on her feet with her walker and fell to the side.  Denies palpitations, chest pain, significant shortness of breath beforehand.  Notes she remembers the entirety of the event.  She was too weak to get up off the floor and was found by family  - Telemetry  - I do not think her junctional rhythm played a role in this.  -PT/OT given progressive neuropathy and feelings of unsteadiness on her feet  - Please send urinalysis as she has frequent urinary tract infections    Intermittent junctional rhythm  - EKGs yesterday showing junctional rhythm, telemetry now showing sinus rhythm with rates in the 60s.  Given her ability to get up and work with physical therapy, do not anticipate any intervention will be needed  - Continue amiodarone 200 mg daily  - Now in normal sinus rhythm on telemetry    Cardiac amyloidosis with severe LVH on echo   Diastolic heart failure  - Repeat echocardiogram reasonable  - Has not tolerated ARB/beta-blocker in the past.  No Jardiance due to recurrent UTIs.  - Does not appear to be in a heart failure exacerbation.  No significant JVD, legs are warm without edema.  - Hold Lasix today, likely restart her home torsemide regimen tomorrow  .                Diagnoses addressed on this consultation:  Fall, acute  History of cardiac amyloidosis  Chronic diastolic heart failure  Junctional rhythm, acute    I discussed the plan with the primary team via Epic chat      This note was generated using speech recognition software and may contain homophonic word substitutions or errors.    Subjective:       Patient states that she is feeling relatively well until yesterday when she lost her balance while walking around her kitchen with her walker, and fell to the side.  She denies loss of consciousness, chest pain, palpitations, significant shortness of breath prior to the event.  She was very weak and unable to get up off the ground until family helped her.    Upon further questioning patient notes progressive worsening neuropathy, balance issues, feelings of unsteadiness on her feet over the past several weeks.  She also notes increasing fatigue and lack of energy.  States she has not had any sicknesses recently, and her weights have been stable.  Eating and drinking normally per her.         Cardiovascular History:  As above    Pertinent Medications:  Scheduled Meds:   amiodarone  200 mg Oral Daily    atorvastatin  20 mg Oral Nightly    azithromycin  250 mg Oral Q24H SCH    fluticasone furoate-vilanterol  1 puff Inhalation Daily (RT)    gabapentin  300 mg Oral Daily    heparin (porcine) for subcutaneous use  5,000 Units Subcutaneous Q8H SCH    insulin glargine  4 Units Subcutaneous Nightly    insulin lispro  0-20 Units Subcutaneous ACHS    montelukast  10 mg Oral Q AM    oxyCODONE  10 mg Oral Q12H SCH    pantoprazole  20 mg Oral Daily    polyethylene glycol  17 g Oral Daily    senna  1 tablet Oral Nightly    spironolactone  25 mg Oral Daily    tafamidis  1 capsule Oral Daily    umeclidinium  1 puff Inhalation Daily (RT)     Continuous Infusions:  PRN Meds:.acetaminophen, albuterol, dextrose in water, glucagon, glucose           Objective:    BP 135/64  - Pulse 70  - Temp 36.7 ??C (98.1 ??F) (Temporal)  - Resp 19  - Ht 167.6 cm (5' 6)  - Wt 100.6 kg (221 lb 12.5 oz)  - SpO2 91%  - BMI 35.80 kg/m??        General: Alert, no distress  HEENT: Overall benign  Cardiac: normal rate, regular rhythm, no murmurs rubs or gallops. No significant JVD JVP. Radial pulses 2+ b/l  Pulmonary: CTAB, no increased work of breathing  Abdomen: Soft, non-tender, non distended.   Extremities: No LE edema. Legs are warm  Neuro: Alert and oriented. No focal deficits    I have personally reviewed Pertinent Notes from the inpatient service service, with patient evaluation including history and physical, discussion with the family and Pertinent Labs, including CBC, BMP      I have independently interpreted Most recent ECG, notable for normal sinus rhythm with sinus arrhythmia. and Most recent echocardiogram, notable for 11/2021-normal biventricular function, diastolic dysfunction grade 2.    I have recommended the primary team order pertinent cardiac tests as detailed above in the assessment and plan.

## 2023-02-15 NOTE — Unmapped (Addendum)
PHYSICAL THERAPY  Evaluation (02/15/23 1056)          Patient Name:  Barbara Huber       Medical Record Number: [id number]   Date of Birth: 13-Oct-1941  Sex: Female        Treatment Diagnosis: s/p fall with now Nondisplaced fractures involving the left superior and inferior pubic rami, decreased endurance     Activity Tolerance: Limited by fatigue, Limited by pain, Tolerated treatment well     ASSESSMENT  Problem List: Decreased endurance, Decreased mobility, Fall risk, Gait deviation, Impaired balance, Impaired ADLs, Decreased strength, Pain, Orthopedic restrictions      Assessment : Barbara Huber is a 82 y.o. female who is presenting to [facility] with Dyspepsia, in the setting of the following pertinent/contributing co-morbidities: COPD, CHF, s/p fall with left hip pain. Found to have nondisplaced fractures involving the left superior and inferior pubic rami.     At baseline, pt is mod I with use of rollator for household mobility. Pt presents today with a decline in basic functional mobility, L>R LE strength/AROM, endurance, and static/dynamic standing balance; all of this further exacerbated and affected by pain and new pelvic fx. Required 4L throughout mobility today and desat to 86% at most. Transfers with RW required min-mod A. Able to ambulate 12'x2 with RW CGA-min A (weightbearing now updated to WBAT bilateral LE), though is impulsive with turns and trying to sit down. Discussed use of RW at this time to promote further offloading for pain management and to increase stability and tolerance to OOB mobility (instead of typical use of rollator).    Pt would continue to benefit from acute PT in order to address ongoing above objective findings. Pt would benefit from 5XL for postacute recommendations in order to address ongoing basic functional mobility impairments, endurance deficits, and return to PLOF, .    After a review of the personal factors, comorbidities, clinical presentation, and examination of the number of affected body systems, the patient presents as a moderate complexity case.     AM-PAC-6 click    Difficulty turning over In bed?: A Little - Minimal/Contact Guard Assist/Supervision  Difficulty sitting down/standing up from chair with arms? : A lot - Maximum/Moderate Assistance  Difficulty moving from supine to sitting on edge of bed?: A lot - Maximum/Moderate Assistance  Help moving to and from bed from wheelchair?: A Little - Minimal/Contact Guard Assist/Supervision  Help currently needed walking in a hospital room?: A Little - Minimal/Contact Guard Assist/Supervision  Help currently needed climbing 3-5 steps with railing?: Unable to do/total assistance - Total Dependent Assist    Basic Mobility Score:  Basic Mobility Score 6 click: 14    6 click  Score (in points): % of Functional Impairment, Limitation, Restriction  6: 100% impaired, limited, restricted  7-8: At least 80%, but less than 100% impaired, limited restricted  9-13: At least 60%, but less than 80% impaired, limited restricted  14-19: At least 40%, but less than 60% impaired, limited restricted  20-22: At least 20%, but less than 40% impaired, limited restricted  23: At least 1%, but less than 20% impaired, limited restricted  24: 0% impaired, limited restricted       Today's Interventions  Today's Interventions: Endurance activities, Gait training, Patient/Family/Caregiver Education, Therapeutic exercise, Therapeutic activity, Balance activities  Today's Interventions: PT eval, transfer, energy conservation/pacing, ambulation, endurance, pt/caregiver education: role of PT, POC, Dc, fall risk reduciton, progressive mobility, WBAT, weightbearing for healing     Personal Factors/Comorbidities Present:  1-2   Examination of Body System: Musculoskeletal, Cardiovascular, Pulmonary, Activity/participation  Clinical Presentation: Evolving    Clinical Decision Making: Moderate        PLAN  Planned Frequency of Treatment:  1-2x per day for: 6-7x week Planned Interventions: Education (Patient/Family/Caregiver), Gait training, Home exercise program, Self-care / Home Management training, Therapeutic Exercise, Therapeutic Activity     Post-Discharge Physical Therapy Recommendations:  PT Post Acute Discharge Recommendations: Skilled PT services indicated, 5x weekly, Low intensity      PT DME Recommendations: Defer to post acute            Goals:   Patient and Family Goals: return to PLOF     SHORT GOAL #1: Pt will perform bed mobility mod I               Time Frame : 2 weeks  SHORT GOAL #2: pt will perform sit <>stand with LRAD mod I              Time Frame : 2 weeks  SHORT GOAL #3: Pt will ambulate 97' with LRAD mod I              Time Frame : 2 weeks  SHORT GOAL #4: Pt will peform 2 STE with bilateral railings min A.               Time Frame : 2 weeks                         Prognosis:  Good  Positive Indicators: PLOF  Barriers to Discharge: Endurance deficits, Inability to safely perform ADLS, Impaired Balance, Severity of deficits, Pain, Functional strength deficits, Decreased caregiver support     SUBJECTIVE  Patient reports: agreeable to PT My son in law was dead asleep when I fell, he didn't hear me  Medical Updates Since Last Visit: CT 2/19: Nondisplaced fractures involving the left superior and inferior pubic rami.; ortho updated to bilateral LE WBAT  Services patient receives prior to admission: OT, PT  Prior Functional Status: mod I with use of rollator for household distances, only leaves the house to go to doctor appts mostly. daughter works first shift and SIL works 3rd shift (SIL sleeping during the day), fell when she was turning in the kitchen. no other fall hx. wears 3L at home all the time  Equipment available at home: Rolling walker, Rollator, Straight cane, Oxygen (3L)   Patient at end of session: All needs in reach, In chair, Friends/Family present, Nurse notified, Lines intact     Past Medical History:   Diagnosis Date    Acute kidney injury superimposed on chronic kidney disease (CMS-HCC) 10/11/2020    Acute on chronic diastolic (congestive) heart failure (CMS-HCC) 08/23/2020    AKI (acute kidney injury) (CMS-HCC) 04/14/2015    Lab Results  Component  Value  Date     CREATININE  1.90 (H)  06/12/2021     Had a bump in her creatinine when she was taking her diuretics every day.  She is currently taking 40 mg daily of torsemide and 50 mg of spironolactone.  Her volume status is fragile.  Previously when she stopped her diuretic she becomes short of breath.  Plan: We will check her BMP today.  We will likely have to go to 40    Arthritis     Calculus of kidney     Calculus of ureter     CHF (congestive heart  failure) (CMS-HCC)     Chronic atrial fibrillation (CMS-HCC) 07/20/2019    COPD (chronic obstructive pulmonary disease) (CMS-HCC)     Diabetes (CMS-HCC)     Gangrenous cholecystitis 10/11/2020    Generalized edema  06/17/2021    GERD (gastroesophageal reflux disease)     Hydronephrosis     Hypertension     Hyponatremia 10/11/2020    Intermediate coronary syndrome (CMS-HCC) 03/13/2014    Lower extremity edema 09/28/2020    Lumbar stenosis     Microscopic hematuria     Nausea alone     Nephrolithiasis 04/17/2016    Neuropathy     Nocturia     Nocturia 07/01/2017    Other chronic cystitis     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  See plans for anemia, AKI  We are also tapering her gabapentin.  Currently on 300 mg 3 times daily.  We will decrease it to twice daily, and then nightly.  She states she is not having any recurrence of her pain.    Pulmonary hypertension (CMS-HCC)     Renal colic     Shortness of breath 04/28/2020    Sleep apnea     Unstable angina pectoris (CMS-HCC) 03/13/2014            Social History     Tobacco Use    Smoking status: Former     Current packs/day: 0.00     Types: Cigarettes     Quit date: 2019     Years since quitting: 5.1    Smokeless tobacco: Never    Tobacco comments:     Quit a few years ago   Substance Use Topics    Alcohol use: No       Past Surgical History:   Procedure Laterality Date    BACK SURGERY  1995    CARPAL TUNNEL RELEASE Left 2014    HYSTERECTOMY  1971    IR INSERT CHOLECYSTOSMY TUBE PERCUTANEOUS  10/02/2020    IR INSERT CHOLECYSTOSMY TUBE PERCUTANEOUS 10/02/2020 Braulio Conte, MD IMG VIR [facility]    LUMBAR DISC SURGERY      PR REMOVAL GALLBLADDER N/A 10/06/2020    Procedure: CHOLECYSTECTOMY;  Surgeon: Katherina Mires, MD;  Location: MAIN OR [facility];  Service: Trauma    PR RIGHT HEART CATH O2 SATURATION & CARDIAC OUTPUT N/A 09/30/2020    Procedure: Right Heart Catheterization;  Surgeon: Neal Dy, MD;  Location: [facility] CATH;  Service: Cardiology    PR RIGHT HEART CATH O2 SATURATION & CARDIAC OUTPUT N/A 01/09/2021    Procedure: Right Heart Catheterization;  Surgeon: Lesle Reek, MD;  Location: [facility] CATH;  Service: Cardiology             Family History   Problem Relation Age of Onset    Hypertension Mother     Cancer Father         COLON CANCER    Anesthesia problems Neg Hx     Broken bones Neg Hx     Clotting disorder Neg Hx     Collagen disease Neg Hx     Diabetes Neg Hx     Dislocations Neg Hx     Fibromyalgia Neg Hx     Gout Neg Hx     Hemophilia Neg Hx     Osteoporosis Neg Hx     Rheumatologic disease Neg Hx     Scoliosis Neg Hx     Severe  sprains Neg Hx     Sickle cell anemia Neg Hx     Spinal Compression Fracture Neg Hx     GU problems Neg Hx     Kidney cancer Neg Hx     Prostate cancer Neg Hx         Allergies: Nitrofurantoin and Lipitor [atorvastatin]                  Objective Findings  Precautions / Restrictions  Precautions: Falls precautions  Weight Bearing Status: RLE WBAT - Weight bearing as tolerated, LLE WBAT - Weight bearing as tolerated  Required Braces or Orthoses: Non-applicable     Communication Preference: Verbal          Pain Comments: 'soreness' with initial mobility, no pain at rest. increased to 7-8/10 pain with once sitting back down. RN aware  Medical Tests / Procedures: reviewed via EPIC; CT 2/19: Nondisplaced fractures involving the left superior and inferior pubic rami.  Equipment / Environment: Vascular access (PIV, TLC, Port-a-cath, PICC), Telemetry, Supplemental oxygen (4L 02 [place])     Vitals/Orthostatics : received and left on 4L. desat to 86% after mobility, able to get back up to 90% within ~2' of PLB and seated rest break.     Living Situation  Living Environment: House  Lives With: Daughter, Family (SIL)  Home Living: One level home, Walk-in shower, Handicapped height toilet, Grab bars in shower, Built-in shower seat, Stairs to alternate level with rails  Rail placement (inside): Bilateral rails  Number of Stairs to Alternate level (inside): 2  Caregiver Identified?: Yes  Caregiver Availability: Nights      Cognition: WFL  Visual/Perception: Wears Glasses/Contacts  Hearing: No deficit identified     Skin Inspection: Intact where visualized     Upper Extremities  UE ROM: Right WFL, Left WFL  UE Strength: Left WFL, Right WFL    Lower Extremities  LE ROM: Left Impaired/Limited, Right WFL  LLE ROM Impairment: Limited AROM, Pain with movement  LE Strength: Left Impaired/Limited, Right WFL  LLE Strength Impairment: Reduced strength  LE comment: L hip flexion unable to lift fully against gravity. L knee extension shaky  but able to fully lift against gravity          Coordination: WFL  Proprioception: Not tested  Sensation: Impaired  Posture: WFL  Motor/Sensory/Neuro Comments: neuropathy in hands and feet bilaterally    Static Sitting-Level of Assistance: Independent  Dynamic Sitting-Level of Assistance: Supervision    Static Standing-Level of Assistance: Contact guard  Dynamic Standing - Level of Assistance: Contact guard  Standing Balance comments: with RW      Bed Mobility: NT     Transfers: Sit to Stand  Sit to Stand assistance level: Moderate assist, patient does 50-74%, Minimal assist, patient does 75% or more  Transfer comments: STS with RW min A and then mod A      Gait Level of Assistance: Minimal assist, patient does 75% or more  Gait Assistive Device: Rolling walker  Gait Distance Ambulated (ft): 24 ft  Skilled Treatment Performed: ambulated 12'x2 with RW CGA with min A on turns, 1 instance of putting forearm on walker due to fatigue-recommending not to perform this for safety reasons. cueing to increase bilateral UE weightbearing during stance phase of LLE for pain management     Stairs: NT            Endurance: increased O2 requirement, decreased endurance, fair    Patient at end of session: All  needs in reach, In chair, Friends/Family present, Nurse notified, Lines intact    Physical Therapy Session Duration  PT Individual [mins]: 38             I attest that I have reviewed the above information.  Signed: Levon Hedger, PT  Filed 02/15/2023

## 2023-02-16 DIAGNOSIS — S32592A Other specified fracture of left pubis, initial encounter for closed fracture: Principal | ICD-10-CM

## 2023-02-16 DIAGNOSIS — E852 Heredofamilial amyloidosis, unspecified: Principal | ICD-10-CM

## 2023-02-16 DIAGNOSIS — E854 Organ-limited amyloidosis: Principal | ICD-10-CM

## 2023-02-16 DIAGNOSIS — I43 Cardiomyopathy in diseases classified elsewhere: Principal | ICD-10-CM

## 2023-02-16 DIAGNOSIS — S32591A Other specified fracture of right pubis, initial encounter for closed fracture: Principal | ICD-10-CM

## 2023-02-16 DIAGNOSIS — I5032 Chronic diastolic (congestive) heart failure: Principal | ICD-10-CM

## 2023-02-16 LAB — BASIC METABOLIC PANEL
ANION GAP: 8 mmol/L (ref 5–14)
BLOOD UREA NITROGEN: 102 mg/dL — ABNORMAL HIGH (ref 9–23)
BUN / CREAT RATIO: 28
CALCIUM: 9 mg/dL (ref 8.7–10.4)
CHLORIDE: 101 mmol/L (ref 98–107)
CO2: 27.2 mmol/L (ref 20.0–31.0)
CREATININE: 3.63 mg/dL — ABNORMAL HIGH
EGFR CKD-EPI (2021) FEMALE: 12 mL/min/{1.73_m2} — ABNORMAL LOW (ref >=60)
GLUCOSE RANDOM: 112 mg/dL (ref 70–179)
POTASSIUM: 4.4 mmol/L (ref 3.4–4.8)
SODIUM: 136 mmol/L (ref 135–145)

## 2023-02-16 LAB — MAGNESIUM: MAGNESIUM: 2.7 mg/dL — ABNORMAL HIGH (ref 1.6–2.6)

## 2023-02-16 MED ADMIN — fluticasone furoate-vilanterol (BREO ELLIPTA) 200-25 mcg/dose inhaler 1 puff: 1 | RESPIRATORY_TRACT | @ 14:00:00

## 2023-02-16 MED ADMIN — tafamidis (VYNDAMAX) 61 mg capsule **Patient Supplied**: 1 | ORAL | @ 15:00:00

## 2023-02-16 MED ADMIN — senna (SENOKOT) tablet 1 tablet: 1 | ORAL | @ 02:00:00

## 2023-02-16 MED ADMIN — atorvastatin (LIPITOR) tablet 20 mg: 20 mg | ORAL | @ 02:00:00

## 2023-02-16 MED ADMIN — oxyCODONE (OxyCONTIN) 12 hr crush resistant ER/CR tablet 10 mg: 10 mg | ORAL | @ 15:00:00 | Stop: 2023-02-28

## 2023-02-16 MED ADMIN — oxyCODONE (OxyCONTIN) 12 hr crush resistant ER/CR tablet 10 mg: 10 mg | ORAL | @ 02:00:00 | Stop: 2023-02-28

## 2023-02-16 MED ADMIN — spironolactone (ALDACTONE) tablet 25 mg: 25 mg | ORAL | @ 15:00:00

## 2023-02-16 MED ADMIN — heparin (porcine) 5,000 unit/mL injection 5,000 Units: 5000 [IU] | SUBCUTANEOUS | @ 11:00:00

## 2023-02-16 MED ADMIN — pantoprazole (Protonix) EC tablet 20 mg: 20 mg | ORAL | @ 15:00:00

## 2023-02-16 MED ADMIN — polyethylene glycol (MIRALAX) packet 17 g: 17 g | ORAL | @ 15:00:00

## 2023-02-16 MED ADMIN — montelukast (SINGULAIR) tablet 10 mg: 10 mg | ORAL | @ 15:00:00

## 2023-02-16 MED ADMIN — heparin (porcine) 5,000 unit/mL injection 5,000 Units: 5000 [IU] | SUBCUTANEOUS | @ 19:00:00

## 2023-02-16 MED ADMIN — umeclidinium (INCRUSE ELLIPTA) 62.5 mcg/actuation inhaler 1 puff: 1 | RESPIRATORY_TRACT | @ 14:00:00

## 2023-02-16 MED ADMIN — heparin (porcine) 5,000 unit/mL injection 5,000 Units: 5000 [IU] | SUBCUTANEOUS | @ 02:00:00

## 2023-02-16 MED ADMIN — gabapentin (NEURONTIN) capsule 300 mg: 300 mg | ORAL | @ 15:00:00

## 2023-02-16 MED ADMIN — polyethylene glycol (MIRALAX) packet 17 g: 17 g | ORAL | @ 17:00:00 | Stop: 2023-02-16

## 2023-02-16 MED ADMIN — amiodarone (PACERONE) tablet 200 mg: 200 mg | ORAL | @ 15:00:00

## 2023-02-16 MED ADMIN — insulin glargine (LANTUS) injection: 4 [IU] | SUBCUTANEOUS | @ 02:00:00

## 2023-02-16 NOTE — Unmapped (Signed)
Medicine Daily Progress Note    Assessment/Plan:  Principal Problem:    Dyspepsia  Active Problems:    COPD (chronic obstructive pulmonary disease) (CMS-HCC)    Essential hypertension    Type 2 diabetes mellitus with stage 4 chronic kidney disease, with long-term current use of insulin (CMS-HCC)    Peripheral neuropathy    Chronic respiratory failure with hypoxia (CMS-HCC)    Anemia in chronic kidney disease    Chronic kidney disease ([name]), stage IV (severe) (CMS-HCC)    Acute on chronic diastolic congestive heart failure (CMS-HCC)    Chronic prescription opiate use    Cardiac amyloidosis (CMS-HCC)    Left hip pain  Resolved Problems:    * No resolved hospital problems. *           Barbara Huber is a 82 y.o. female who presented to [facility] with Dyspepsia.    Acute hypoxic respiratory failure  Acute on chronic CHF with preserved EF   Cardiac amyloidosis (CMS-HCC)  Dry weight 213-214 lbs, will check weight today. S/p furosemide 40mg  IV x 1 2/18, 80mg  IV 2/19 with rise in Cr. Difficult to determine volume status.   -Hold lasix today.   -TTE pending.   -Continue strict Is/Os, daily weights.   -Continue home Tafamidis.  -Continue spironolactone.   -Has not tolerated ARB/beta-blocker in the past.  No Jardiance due to recurrent UTIs.    Acute Kidney Injury on Chronic kidney disease ([name]), stage IV (severe) (CMS-HCC): Baseline Cr ~2.5, stable at 3.6, BUN 102 with concern for myoclonus possibly due to uremia. Nephrology consulted, appreciate recommendations. No evidence of ATN in urine sediment.   -Hold diuretics today.  -Strict Is/Os.  -Monitor daily BMP.   -Follow-up nephrology recommendations.     Atrial fibrillation  Junctional bradycardia   H/o persistent afib s/p cardioversion and initiation of amiodarone 2022. Junctional rhythm on initial EKG, resolved, not felt to have contributed to pt's fall. TSH wnl.   -Continue amiodarone  -Telemetry.     Non-displaced hip fracture  CT demonstrating nondisplaced fractures of left superior and inferior pubic rami felt to be due to a mechanical fall. Appreciate ortho consultation.   -WBAT.  -Continue PT/OT. PT rec rehab.  -Follow-up vitamin D level.     COPD (chronic obstructive pulmonary disease) (CMS-HCC)  -Continue home inhaler regimen.      Essential hypertension  - Continue spirinolactone 25 mg every day.                 Type 2 diabetes mellitus with stage 4 chronic kidney disease, with long-term current use of insulin (CMS-HCC)   -Continue home Lantus 4 units nightly  -Sliding scale insulin        Peripheral neuropathy  Radiculopathy  Chronic pain  Followed by Pain Management (Dr Ronita Hipps, [facility] [place]).  -Continue home oxycontin 10mg  q12h. Monitor for sedation in the setting of renal failure.   -Continue gabapentin.     PPX  Heparin    Code status  Full code    Disposition  Pending improvement in renal function, euvolemia. PT rec SNF, OT consult pending, family to discuss.     ___________________________________________________________________    Subjective:  Jerking noted yesterday, pt and daughter report this has happened in the past. Breathing is comfortable.     Labs/Studies:  Labs per EMR and Reviewed (last 24hrs)    Objective:  Temp:  [36.5 ??C (97.7 ??F)-36.8 ??C (98.2 ??F)] 36.5 ??C (97.7 ??F)  Heart Rate:  [  60-65] 61  Resp:  [18] 18  BP: (116-129)/(57-62) 116/57  SpO2:  [90 %-94 %] 94 %    GEN: No acute distress  HEENT: Mucous membranes moist  CV: RRR, no murmurs, rubs, or gallops  PULM: Diminished breath sounds bilateral lung bases  ABD: Soft, non-tender, non-distended, positive bowel sounds, no rebound or guarding  EXT: No LEE  NEURO: Alert, answering questions appropriately

## 2023-02-16 NOTE — Unmapped (Signed)
Cardiology Follow-Up Note    Requesting Attending Physician: Herby Abraham, MD  Service Requesting Consult: Hospital Medicine    Patient ID: Barbara Huber is a 82 y.o. female patient with TTR cardiac amyloidosis, chronic [name], paroxysmal AF, [name] stage III, COPD on home O2, admitted after a fall with nondisplaced pubic bone fractures.    Assessment/Plan:      Intermittent junctional rhythm.  Paroxysmal AF.  Currently in NSR with rates in the 60s. She did not have LOC with her fall.  -amiodarone 200 mg daily  -not on [facility]    Cardiac amyloidosis.  Chronic [name].  -tafamidis 1 capsule daily  -spironolactone 25 mg daily  -consider restarting home torsemide pending improvement in renal function    We will sign off; please do not hesitate to contact the cardiology consult team with any questions.    Kathreen Devoid Verdis Frederickson, MD, [facility]  Assistant Professor of Medicine  Division of Cardiology  [facility]    pager: [phone]    02/16/2023 13:21    Subjective:      Interval History:  No events overnight. Cr remains elevated. Orthopedics not planning for operative repair for now.    Current Facility-Administered Medications   Medication Dose Route Frequency Provider Last Rate Last Admin    acetaminophen (TYLENOL) tablet 1,000 mg  1,000 mg Oral Q8H PRN Corinna Capra, MD        albuterol (PROVENTIL HFA;VENTOLIN HFA) 90 mcg/actuation inhaler 2 puff  2 puff Inhalation Q8H PRN Corinna Capra, MD        amiodarone (PACERONE) tablet 200 mg  200 mg Oral Daily Corinna Capra, MD   200 mg at 02/16/23 4696    atorvastatin (LIPITOR) tablet 20 mg  20 mg Oral Nightly Corinna Capra, MD   20 mg at 02/15/23 2122    dextrose (D10W) 10% bolus 125 mL  12.5 g Intravenous Q10 Min PRN Corinna Capra, MD        fluticasone furoate-vilanterol (BREO ELLIPTA) 200-25 mcg/dose inhaler 1 puff  1 puff Inhalation Daily (RT) Corinna Capra, MD   1 puff at 02/16/23 0910    gabapentin (NEURONTIN) capsule 300 mg  300 mg Oral Daily Corinna Capra, MD   300 mg at 02/16/23 2952    glucagon injection 1 mg  1 mg Intramuscular Once PRN Corinna Capra, MD        glucose chewable tablet 16 g  16 g Oral Q10 Min PRN Corinna Capra, MD        heparin (porcine) 5,000 unit/mL injection 5,000 Units  5,000 Units Subcutaneous [facility] Corinna Capra, MD   5,000 Units at 02/16/23 8413    insulin glargine (LANTUS) injection  4 Units Subcutaneous Nightly Corinna Capra, MD   4 Units at 02/15/23 2122    insulin lispro (HumaLOG) injection 0-20 Units  0-20 Units Subcutaneous ACHS Corinna Capra, MD        montelukast (SINGULAIR) tablet 10 mg  10 mg Oral Q AM Corinna Capra, MD   10 mg at 02/16/23 0936    oxyCODONE (OxyCONTIN) 12 hr crush resistant ER/CR tablet 10 mg  10 mg Oral Q12H [facility] Corinna Capra, MD   10 mg at 02/16/23 0936    pantoprazole (Protonix) EC tablet 20 mg  20 mg Oral Daily Corinna Capra, MD   20 mg at 02/16/23 0936    polyethylene glycol (MIRALAX) packet 17 g  17 g Oral Daily Corinna Capra, MD   17 g at 02/16/23 1610    senna (SENOKOT) tablet 1 tablet  1 tablet Oral Nightly Corinna Capra, MD   1 tablet at 02/15/23 2122    spironolactone (ALDACTONE) tablet 25 mg  25 mg Oral Daily Corinna Capra, MD   25 mg at 02/16/23 0936    tafamidis (VYNDAMAX) 61 mg capsule **Patient Supplied**  1 capsule Oral Daily Corinna Capra, MD   1 capsule at 02/16/23 0943    umeclidinium (INCRUSE ELLIPTA) 62.5 mcg/actuation inhaler 1 puff  1 puff Inhalation Daily (RT) Corinna Capra, MD   1 puff at 02/16/23 0911       Objective:   Physical Exam:  VITAL SIGNS: Temp:  [36.5 ??C (97.7 ??F)-36.8 ??C (98.2 ??F)] 36.5 ??C (97.7 ??F)  Heart Rate:  [60-65] 61  Resp:  [18] 18  BP: (116-129)/(57-62) 116/57  SpO2:  [90 %-94 %] 94 %  GEN: adult female patient in NAD  HEENT: NCAT, sclerae anicteric, OP clear  NECK: JVP difficult to appreciate  CARD: RRR   S1/S2 audible   no murmurs, rubs, or gallops  RESP: CTAB, no wheezes, crackles, or ronchi, normal work of breathing  ABDO: soft, NT/ND  EXTREM: WWP, PPP, 1+ BLE edema  NEURO: AAO, CN II-XII grossly normal, moving all 4 extremities  SKIN: warm and dry    Results:  Labs:   Lab Results   Component Value Date    WBC 10.5 02/15/2023    HGB 13.0 02/15/2023    HCT 39.9 02/15/2023    PLT 143 (L) 02/15/2023     Lab Results   Component Value Date    NA 136 02/16/2023    K 4.4 02/16/2023    CREATININE 3.63 (H) 02/16/2023     Imaging/Other: none new

## 2023-02-16 NOTE — Unmapped (Signed)
Care Management  Initial Transition Planning Assessment  Type of Residence: Mailing Address:  [address] [place] [address]  Contacts: Accompanied by: Family member  Patient Phone Number: [phone] (home)           Medical Provider(s): Artelia Laroche, MD  Reason for Admission: Admitting Diagnosis:  Fall, initial encounter [W19.XXXA]  Dyspnea, unspecified type [R06.00]  Acute on chronic congestive heart failure, unspecified heart failure type (CMS-HCC) [I50.9]  Past Medical History:   has a past medical history of Acute kidney injury superimposed on chronic kidney disease (CMS-HCC) (10/11/2020), Acute on chronic diastolic (congestive) heart failure (CMS-HCC) (08/23/2020), AKI (acute kidney injury) (CMS-HCC) (04/14/2015), Arthritis, Calculus of kidney, Calculus of ureter, CHF (congestive heart failure) (CMS-HCC), Chronic atrial fibrillation (CMS-HCC) (07/20/2019), COPD (chronic obstructive pulmonary disease) (CMS-HCC), Diabetes (CMS-HCC), Gangrenous cholecystitis (10/11/2020), Generalized edema  (06/17/2021), GERD (gastroesophageal reflux disease), Hydronephrosis, Hypertension, Hyponatremia (10/11/2020), Intermediate coronary syndrome (CMS-HCC) (03/13/2014), Lower extremity edema (09/28/2020), Lumbar stenosis, Microscopic hematuria, Nausea alone, Nephrolithiasis (04/17/2016), Neuropathy, Nocturia, Nocturia (07/01/2017), Other chronic cystitis, 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  Past Surgical History:   has a past surgical history that includes Carpal tunnel release (Left, 2014); Hysterectomy (1971); Back surgery (1995); Lumbar disc surgery; IR Insert Cholecystosmy Tube Percutaneous (10/02/2020); pr removal gallbladder (N/A, 10/06/2020); pr right heart cath o2 saturation & cardiac output (N/A, 09/30/2020); and pr right heart cath o2 saturation & cardiac output (N/A, 01/09/2021).   Previous admit date: 12/06/2021    Primary Insurance- Payor: HUMANA MEDICARE ADV / Plan: HUMANA GOLD PLUS HMO / Product Type: *No Product type* /   Secondary Insurance - None  Prescription Coverage - see above  Preferred Pharmacy - [facility] [place] [address]  [id number] [place], [place] - [address] AT [facility]    Transportation home:  Daughter             Social research officer, government assessed the patient by : In person interview with patient, Medical record review  Orientation Level: Oriented X4  Functional level prior to admission: Independent  Reason for referral: Discharge Planning    Contact/Decision Maker  Extended Emergency Contact Information  Primary Emergency Contact: [name],[name]  Address: [address]           [place], [place] [address] [place]  Home Phone: [phone]  Work Phone: [phone]  Mobile Phone: [phone]  Relation: Daughter    Legal Next of Kin / Guardian / POA / Advance Directives     HCDM (patient stated preference): [name],[name] - Daughter - [phone]    Advance Directive (Medical Treatment)  Does patient have an advance directive covering medical treatment?: Patient has advance directive covering medical treatment, copy in chart.    Health Care Decision Maker [HCDM] (Medical & Mental Health Treatment)  Healthcare Decision Maker: HCDM documented in the HCDM/Contact Info section.  Information offered on HCDM, Medical & Mental Health advance directives:: Patient declined information.         Readmission Information  Did the following happen with your discharge?  Patient Information  Lives with: Family members    Type of Residence: Private residence        Location/Detail: [address] [place] [address]    Support Systems/Concerns: Family Members    Responsibilities/Dependents at home?:  No    Home Care services in place prior to admission?: No      Equipment Currently Used at Home: walker, rolling, grab bar, tub/shower, grab bar, toilet, oxygen, other (see comments), commode chair (Rollator)  Current HME Agency (Name/Phone #): Could not remember    Currently receiving outpatient dialysis?: No       Financial Information       Need for financial assistance?: No       Social Determinants of Health  Social Determinants of Health     Financial Resource Strain: Low Risk  (02/16/2023)    Overall Financial Resource Strain (CARDIA)     Difficulty of Paying Living Expenses: Not hard at all   Internet Connectivity: Not on file   Food Insecurity: No Food Insecurity (02/16/2023)    Hunger Vital Sign     Worried About Running Out of Food in the Last Year: Never true     Ran Out of Food in the Last Year: Never true   Tobacco Use: Medium Risk (02/15/2023)    Patient History     Smoking Tobacco Use: Former     Smokeless Tobacco Use: Never     Passive Exposure: Not on file   Housing/Utilities: Low Risk  (02/16/2023)    Housing/Utilities     Within the past 12 months, have you ever stayed: outside, in a car, in a tent, in an overnight shelter, or temporarily in someone else's home (i.e. couch-surfing)?: No     Are you worried about losing your housing?: No     Within the past 12 months, have you been unable to get utilities (heat, electricity) when it was really needed?: No   Alcohol Use: Not At Risk (07/22/2022)    Alcohol Use     How often do you have a drink containing alcohol?: Never     How many drinks containing alcohol do you have on a typical day when you are drinking?: 1 - 2     How often do you have 5 or more drinks on one occasion?: Never   Transportation Needs: No Transportation Needs (02/16/2023)    PRAPARE - Transportation     Lack of Transportation (Medical): No     Lack of Transportation (Non-Medical): No   Substance Use: Low Risk  (05/02/2021)    Substance Use     Taken prescription drugs for non-medical reasons: Never     Taken illegal drugs: Never     Patient indicated they have taken drugs in the past year for non-medical reasons: Yes, [positive answer(s)]: Not on file   Health Literacy: Medium Risk (05/02/2021)    Health Literacy     : Rarely   Physical Activity: Unknown (11/04/2017)    Received from [facility]    Exercise Vital Sign     Days of Exercise per Week: Patient declined     Minutes of Exercise per Session: Patient declined   Interpersonal Safety: Not on file   Stress: Unknown (11/04/2017)    Received from [facility] - Occupational Stress Questionnaire     Feeling of Stress : Patient declined   Intimate Partner Violence: Not At Risk (12/17/2021)    Humiliation, Afraid, Rape, and Kick questionnaire     Fear of Current or Ex-Partner: No     Emotionally Abused: No     Physically Abused: No     Sexually Abused: No   Depression: Not  at risk (04/22/2022)    PHQ-2     PHQ-2 Score: 0   Social Connections: Unknown (11/04/2017)    Received from [facility]    Social Connection and Isolation Panel [NHANES]     Frequency of Communication with Friends and Family: Patient declined     Frequency of Social Gatherings with Friends and Family: Patient declined     Attends Religious Services: Patient declined     Database administrator or Organizations: Patient declined     Attends Engineer, structural: Patient declined     Marital Status: Patient declined       Complex Discharge Information    Is patient identified as a difficult/complex discharge?: No       Interventions:       Discharge Needs Assessment  Concerns to be Addressed: no discharge needs identified    Clinical Risk Factors: > 65    Barriers to taking medications: No    Prior overnight hospital stay or [name] in last 90 days: No    Anticipated Changes Related to Illness: none    Equipment Needed After Discharge: other (see comments) (TBD)    Discharge Facility/Level of Care Needs:      Readmission  Risk of Unplanned Readmission Score: UNPLANNED READMISSION SCORE: 14.85%  Predictive Model Details          15% (Medium)  Factor Value    Calculated 02/16/2023 12:03 29% Number of active inpatient medication orders 35    [place] Risk of Unplanned Readmission Model 11% ECG/EKG order present in last 6 months     9% Latest BUN high (102 mg/dL)     8% Imaging order present in last 6 months     8% Age 10     7% Number of ED visits in last six months 1     6% Charlson Comorbidity Index 4     5% Diagnosis of deficiency anemia present     5% Active anticoagulant inpatient medication order present     5% Latest creatinine high (3.63 mg/dL)     5% Diagnosis of renal failure present     2% Future appointment scheduled     1% Active ulcer inpatient medication order present     0% Current length of stay 0.053 days      Readmitted Within the Last 30 Days? (No if blank)   Patient at risk for readmission?: No    Discharge Plan  Screen findings are: Care Manager reviewed the plan of the patient's care with the Multidisciplinary Team. No discharge planning needs identified at this time. Care Manager will continue to manage plan and monitor patient's progress with the team.    Expected Discharge Date: 02/17/2023    Expected Transfer from Critical Care:                 Initial Assessment complete?: Yes

## 2023-02-16 NOTE — Unmapped (Signed)
PT is alert and oriented, vitals are within range. She had complaints of pain and was given her oxycodone. She was able to work with physical therapy and OT and did well. She continues to rest in her chair. She was transported downstairs for imaging and did well. At this time she is resting with her family at beside. Her call bell is within reach and she continues to call for assistance when needed.       Problem: Adult Inpatient Plan of Care  Goal: Plan of Care Review  Outcome: Progressing  Goal: Patient-Specific Goal (Individualized)  Outcome: Progressing  Goal: Absence of Hospital-Acquired Illness or Injury  Outcome: Progressing  Intervention: Identify and Manage Fall Risk  Recent Flowsheet Documentation  Taken 02/15/2023 1600 by Durwin Glaze, RN  Safety Interventions:   fall reduction program maintained   low bed  Taken 02/15/2023 1400 by Durwin Glaze, RN  Safety Interventions:   chair alarm   fall reduction program maintained   family at bedside   low bed  Taken 02/15/2023 1200 by Durwin Glaze, RN  Safety Interventions:   fall reduction program maintained   low bed   chair alarm  Taken 02/15/2023 1000 by Durwin Glaze, RN  Safety Interventions:   fall reduction program maintained   family at bedside   low bed   chair alarm  Taken 02/15/2023 0831 by Durwin Glaze, RN  Safety Interventions:   chair alarm   fall reduction program maintained   family at bedside   low bed  Intervention: Prevent Skin Injury  Recent Flowsheet Documentation  Taken 02/15/2023 0831 by Durwin Glaze, RN  Positioning for Skin: Sitting in Chair  Goal: Optimal Comfort and Wellbeing  Outcome: Progressing  Goal: Readiness for Transition of Care  Outcome: Progressing  Goal: Rounds/Family Conference  Outcome: Progressing     Problem: Fall Injury Risk  Goal: Absence of Fall and Fall-Related Injury  Outcome: Progressing  Intervention: Promote Injury-Free Environment  Recent Flowsheet Documentation  Taken 02/15/2023 1600 by Durwin Glaze, RN  Safety Interventions:   fall reduction program maintained   low bed  Taken 02/15/2023 1400 by Durwin Glaze, RN  Safety Interventions:   chair alarm   fall reduction program maintained   family at bedside   low bed  Taken 02/15/2023 1200 by Durwin Glaze, RN  Safety Interventions:   fall reduction program maintained   low bed   chair alarm  Taken 02/15/2023 1000 by Durwin Glaze, RN  Safety Interventions:   fall reduction program maintained   family at bedside   low bed   chair alarm  Taken 02/15/2023 0831 by Durwin Glaze, RN  Safety Interventions:   chair alarm   fall reduction program maintained   family at bedside   low bed

## 2023-02-16 NOTE — Unmapped (Signed)
ORTHOPAEDIC CONSULT  - Primary Service for this Patient: Med Undesignated (MDX).  Patient is seen in consultation at the request of  [facility]  for evaluation of the following:     ASSESSMENT AND PLAN:  Barbara Huber is a 82 y.o. female with a history of  COPD, CHF, T2DM on insulin (last A1C 5.6), [name] 3  seen in consultation at the request of Herby Abraham, MD for the evaluation of the following:     1)  Left  non displaced superior and minimally displaced inferior pubic ramus fractures    Hemodynamically stable.  Currently with a mechanically stable pelvic ring injury.  Formal AP, Inlet and Outlet views and CT of the pelvis available at the time of consult.   Post mobilization xrays remain unchanged  Operative treatment is not anticipated  Weight Bearing Status/Activity: weightbearing as tolerated on the left lower extremity with walker.     - Recommended Additional Labs: No new labs needed.  - Pain control: per primary service.  - Best contact number: [phone] (home)    - Follow-up plan: [facility] will arrange appropriate orthopaedic follow up.      Pre-Operative Orthopaedic Risk Assessment:   Smoking:  Former  Home Oxygen Requirement: On 4 L of home oxygen  Hgb A1c:  5.7 (10/29/22)  Bleeding and home anticoagulation meds: Denies history of coagulopathy and home anticoagulation.   Height:         02/14/23 167.6 cm (5' 6)     Weight:          02/14/23 100.6 kg (221 lb 12.5 oz)       This patient discussed with senior on call resident, consult will be staffed with on-call attending.    * Please contact the resident who leaves daily progress notes for any questions while patient is inpatient during weekdays.  * Please page orthopaedic consult pager [phone]) on nights (after 5PM) and weekends.    PROCEDURE(S)   none    SUBJECTIVE     Chief Complaint:   left hip pain    History of Present Illness:     Barbara Huber is a 82 y.o. female,  retired , with relevant PMH as below who presents after a  ground level fall when attempting to turn with her walker . No LOC or head injury. Pain reported in left hip and pelvis. Has been able to ambulate since fall with use of walker. At baseline, ambulates with a walker . Lives at home with caregivers.  Minimal activity at home. Denies pain elsewhere. History of lumbar radiculopathy and neuropathy but denies increased numbness or tingling in the bilateral lower extremities. No other injuries.    Currently admitted for management of acute on chronic CHF with fluid overload.        Medical History   Past Medical History:   Diagnosis Date    Acute kidney injury superimposed on chronic kidney disease (CMS-HCC) 10/11/2020    Acute on chronic diastolic (congestive) heart failure (CMS-HCC) 08/23/2020    AKI (acute kidney injury) (CMS-HCC) 04/14/2015    Lab Results  Component  Value  Date     CREATININE  1.90 (H)  06/12/2021     Had a bump in her creatinine when she was taking her diuretics every day.  She is currently taking 40 mg daily of torsemide and 50 mg of spironolactone.  Her volume status is fragile.  Previously when she stopped her diuretic she becomes short of breath.  Plan:  We will check her BMP today.  We will likely have to go to 40    Arthritis     Calculus of kidney     Calculus of ureter     CHF (congestive heart failure) (CMS-HCC)     Chronic atrial fibrillation (CMS-HCC) 07/20/2019    COPD (chronic obstructive pulmonary disease) (CMS-HCC)     Diabetes (CMS-HCC)     Gangrenous cholecystitis 10/11/2020    Generalized edema  06/17/2021    GERD (gastroesophageal reflux disease)     Hydronephrosis     Hypertension     Hyponatremia 10/11/2020    Intermediate coronary syndrome (CMS-HCC) 03/13/2014    Lower extremity edema 09/28/2020    Lumbar stenosis     Microscopic hematuria     Nausea alone     Nephrolithiasis 04/17/2016    Neuropathy     Nocturia     Nocturia 07/01/2017    Other chronic cystitis     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  See plans for anemia, AKI  We are also tapering her gabapentin.  Currently on 300 mg 3 times daily.  We will decrease it to twice daily, and then nightly.  She states she is not having any recurrence of her pain.    Pulmonary hypertension (CMS-HCC)     Renal colic     Shortness of breath 04/28/2020    Sleep apnea     Unstable angina pectoris (CMS-HCC) 03/13/2014      Surgical History   Past Surgical History:   Procedure Laterality Date    BACK SURGERY  1995    CARPAL TUNNEL RELEASE Left 2014    HYSTERECTOMY  1971    IR INSERT CHOLECYSTOSMY TUBE PERCUTANEOUS  10/02/2020    IR INSERT CHOLECYSTOSMY TUBE PERCUTANEOUS 10/02/2020 Braulio Conte, MD IMG VIR [facility]    LUMBAR DISC SURGERY      PR REMOVAL GALLBLADDER N/A 10/06/2020    Procedure: CHOLECYSTECTOMY;  Surgeon: Katherina Mires, MD;  Location: MAIN OR [facility];  Service: Trauma    PR RIGHT HEART CATH O2 SATURATION & CARDIAC OUTPUT N/A 09/30/2020    Procedure: Right Heart Catheterization;  Surgeon: Neal Dy, MD;  Location: [facility] CATH;  Service: Cardiology    PR RIGHT HEART CATH O2 SATURATION & CARDIAC OUTPUT N/A 01/09/2021    Procedure: Right Heart Catheterization;  Surgeon: Lesle Reek, MD;  Location: [facility] CATH;  Service: Cardiology      Medications   Current Facility-Administered Medications   Medication Dose Route Frequency Provider Last Rate Last Admin    acetaminophen (TYLENOL) tablet 1,000 mg  1,000 mg Oral Q8H PRN Corinna Capra, MD        albuterol (PROVENTIL HFA;VENTOLIN HFA) 90 mcg/actuation inhaler 2 puff  2 puff Inhalation Q8H PRN Corinna Capra, MD        amiodarone (PACERONE) tablet 200 mg  200 mg Oral Daily Corinna Capra, MD   200 mg at 02/15/23 0831    atorvastatin (LIPITOR) tablet 20 mg  20 mg Oral Nightly Corinna Capra, MD   20 mg at 02/15/23 2122    dextrose (D10W) 10% bolus 125 mL  12.5 g Intravenous Q10 Min PRN Corinna Capra, MD        fluticasone furoate-vilanterol (BREO ELLIPTA) 200-25 mcg/dose inhaler 1 puff  1 puff Inhalation Daily (RT) Corinna Capra, MD   1 puff at 02/16/23 0910    gabapentin (NEURONTIN)  capsule 300 mg  300 mg Oral Daily Corinna Capra, MD   300 mg at 02/15/23 0831    glucagon injection 1 mg  1 mg Intramuscular Once PRN Corinna Capra, MD        glucose chewable tablet 16 g  16 g Oral Q10 Min PRN Corinna Capra, MD        heparin (porcine) 5,000 unit/mL injection 5,000 Units  5,000 Units Subcutaneous [facility] Corinna Capra, MD   5,000 Units at 02/16/23 1610    insulin glargine (LANTUS) injection  4 Units Subcutaneous Nightly Corinna Capra, MD   4 Units at 02/15/23 2122    insulin lispro (HumaLOG) injection 0-20 Units  0-20 Units Subcutaneous ACHS Corinna Capra, MD        montelukast (SINGULAIR) tablet 10 mg  10 mg Oral Q AM Corinna Capra, MD   10 mg at 02/15/23 0830    oxyCODONE (OxyCONTIN) 12 hr crush resistant ER/CR tablet 10 mg  10 mg Oral Q12H SCH Corinna Capra, MD   10 mg at 02/15/23 2122    pantoprazole (Protonix) EC tablet 20 mg  20 mg Oral Daily Corinna Capra, MD   20 mg at 02/15/23 0830    polyethylene glycol (MIRALAX) packet 17 g  17 g Oral Daily Corinna Capra, MD   17 g at 02/15/23 0830    senna (SENOKOT) tablet 1 tablet  1 tablet Oral Nightly Corinna Capra, MD   1 tablet at 02/15/23 2122    spironolactone (ALDACTONE) tablet 25 mg  25 mg Oral Daily Corinna Capra, MD   25 mg at 02/15/23 0831    tafamidis (VYNDAMAX) 61 mg capsule **Patient Supplied**  1 capsule Oral Daily Corinna Capra, MD   1 capsule at 02/15/23 9604    umeclidinium (INCRUSE ELLIPTA) 62.5 mcg/actuation inhaler 1 puff  1 puff Inhalation Daily (RT) Corinna Capra, MD   1 puff at 02/16/23 0911      Allergies   Nitrofurantoin and Lipitor [atorvastatin]     Social History    Employment: retired.  Family/friend support: Family/friends present at time of consult..    Tobacco use:   Social History     Tobacco Use   Smoking Status Former    Current packs/day: 0.00    Types: Cigarettes    Quit date: 2019    Years since quitting: 5.1   Smokeless Tobacco Never   Tobacco Comments    Quit a few years ago   .        Family History   No family history of anesthesia problems.  Family History   Problem Relation Age of Onset    Hypertension Mother     Cancer Father         COLON CANCER    Anesthesia problems Neg Hx     Broken bones Neg Hx     Clotting disorder Neg Hx     Collagen disease Neg Hx     Diabetes Neg Hx     Dislocations Neg Hx     Fibromyalgia Neg Hx     Gout Neg Hx     Hemophilia Neg Hx     Osteoporosis Neg Hx     Rheumatologic disease Neg Hx     Scoliosis Neg Hx     Severe sprains Neg Hx     Sickle cell anemia Neg Hx     Spinal Compression Fracture Neg Hx  GU problems Neg Hx     Kidney cancer Neg Hx     Prostate cancer Neg Hx          Review of 10 Systems          Symptoms in the past week?  1) Musculoskeletal per HPI  2) Fevers (Constitutional)  No.     3) Chest pain (CV)              No.      4) Eye Pain   (Eyes)              No.        5) Anxiety (Psych)               No.      6) Wheezing (Resp)   No.      7) Nausea (GI)                No.       8) Sore throat (ENT)                         No.       9) Rash (Skin)            No.     10) Numbness (Neuro)                     No.             OBJECTIVE     PHYSICAL EXAM   Constitutional   Vitals  Estimated body mass index is 35.8 kg/m?? as calculated from the following:    Height as of this encounter: 167.6 cm (5' 6).    Weight as of this encounter: 100.6 kg (221 lb 12.5 oz).   Vitals:    02/16/23 0912   BP:    Pulse: 61   Resp: 18   Temp:    SpO2: 94%        General appearance  well-nourished, no acute distress   Psychiatric Orientation: Based on my interaction with the patient, I determined that she is appropriately oriented.  Mood and Affect: alert, cooperative, and pleasant   Respiratory Respiratory effort is not labored, without evidence of pain or SOB.   Genitourinary not applicable.   Hematologic /lymphatic trauma: normal bruising or hematoma, consistent with level of trauma.   Cardiovascular See vascular (pulses) examination under extremities.   Neurologic See specific peripheral nerve exam under extremities.   Skin See lacerations or skin injuries under extremities.   Musculoskeletal   Extremities:      LLE: Expected tenderness, swelling and ecchymosis of perineum and flank. No perineal/flank laceration or hematoma. Pain with passive ROM of hip. No pain with log roll. Equal length compared to contralateral. Skin intact.  No tenting, impending open fracture. Excluding above findings, Non tender to palpation, with full and painless ROM throughout knee and ankle. + GS/TA/EHL. SILT in DP/SP/S/S/T distributions.  2-point discrimination deferred.  2+ DP pulse with warm and well perfused toes. Ligamentous exam stable. Compartments soft and compressible, with no pain on passive stretch.     RLE: No swelling, ecchymoses, deformity, or effusion. Skin intact. No tenting, impending open fracture.Nontender to palpation, with full and painless ROM throughout. + GS/TA/EHL. SILT in DP/SP/S/S/T distributions. 2-point discrimination deferred. 2+ DP pulse with warm and well perfused toes. Ligamentous exam stable. Compartments soft and compressible, with no pain on passive stretch.  Test Results  Imaging  Radiology studies were personally reviewed.    AP pelvis with Left  superior and inferior pubic ramus fractures . Fractures are minimally displaced.     CT pelvis with Left  confirm minimally displaced superior and inferior pubic ramus fracture .    Labs  Lab Results   Component Value Date    WBC 10.5 02/15/2023    HGB 13.0 02/15/2023    HCT 39.9 02/15/2023    PLT 143 (L) 02/15/2023      No results in the last day   Lab Results   Component Value Date    NA 136 02/16/2023    K 4.4 02/16/2023    GLU 112 02/16/2023     Lab Results   Component Value Date    PT 25.0 (H) 07/01/2021    INR 2.15 07/01/2021    APTT 46.9 (H) 01/10/2021     Lab Results   Component Value Date    ESR 21 04/28/2020    CRP <4.0 06/03/2021     No results found for: ALB                  Comorbidities  See primary team documentation, or consult notes from medical or trauma services.    Patient Active Problem List   Diagnosis    Spinal stenosis of lumbar region    Thoracic or lumbosacral neuritis or radiculitis    Lumbosacral spondylosis    COPD (chronic obstructive pulmonary disease) (CMS-HCC)    Sleep apnea in adult    Essential hypertension    Type 2 diabetes mellitus with stage 4 chronic kidney disease, with long-term current use of insulin (CMS-HCC)    Chronic cystitis    Incomplete bladder emptying    Peripheral neuropathy    Chronic respiratory failure with hypoxia (CMS-HCC)    Anemia    Persistent atrial fibrillation (CMS-HCC)    Anemia in chronic kidney disease    Chronic kidney disease ([name]), stage IV (severe) (CMS-HCC)    Pulmonary hypertension (CMS-HCC)    Acute on chronic diastolic congestive heart failure (CMS-HCC)    Chronic prescription opiate use    Dyspnea on exertion    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)    Cardiac amyloidosis (CMS-HCC)    Recurrent cold sores    Dyspepsia    Iron deficiency anemia due to chronic blood loss    Neuropathy    Left hip pain       Note created by Dennard Schaumann, PA, February 16, 2023 9:36 AM

## 2023-02-16 NOTE — Unmapped (Signed)
Pt vitals remain stable and pt tolerating meds well. Blood glucose checked and managed with Lantus. No Humalog given per order parameters. Bed locked and low and call bell within reach. Bedside commode in use. Daughter in room all night.    24 hr chart check compelte    Problem: Adult Inpatient Plan of Care  Goal: Plan of Care Review  Outcome: Progressing  Goal: Patient-Specific Goal (Individualized)  Outcome: Progressing  Goal: Absence of Hospital-Acquired Illness or Injury  Outcome: Progressing  Intervention: Identify and Manage Fall Risk  Recent Flowsheet Documentation  Taken 02/15/2023 2200 by Sherrie Mustache, RN  Safety Interventions:   low bed   fall reduction program maintained   nonskid shoes/slippers when out of bed  Taken 02/15/2023 2000 by Sherrie Mustache, RN  Safety Interventions:   low bed   fall reduction program maintained   nonskid shoes/slippers when out of bed  Intervention: Prevent Skin Injury  Recent Flowsheet Documentation  Taken 02/15/2023 2200 by Sherrie Mustache, RN  Positioning for Skin: Supine/Back  Skin Protection: adhesive use limited  Taken 02/15/2023 2000 by Sherrie Mustache, RN  Positioning for Skin: Supine/Back  Skin Protection: adhesive use limited  Goal: Optimal Comfort and Wellbeing  Outcome: Progressing  Goal: Readiness for Transition of Care  Outcome: Progressing  Goal: Rounds/Family Conference  Outcome: Progressing     Problem: Fall Injury Risk  Goal: Absence of Fall and Fall-Related Injury  Outcome: Progressing  Intervention: Promote Injury-Free Environment  Recent Flowsheet Documentation  Taken 02/15/2023 2200 by Sherrie Mustache, RN  Safety Interventions:   low bed   fall reduction program maintained   nonskid shoes/slippers when out of bed  Taken 02/15/2023 2000 by Sherrie Mustache, RN  Safety Interventions:   low bed   fall reduction program maintained   nonskid shoes/slippers when out of bed

## 2023-02-16 NOTE — Unmapped (Signed)
Nephrology Consult Note    Requesting Attending Physician :  Herby Abraham, MD  Service Requesting Consult : Med Undesignated (MDX)  Reason for Consult: AKI    Assessment and Plan:  # AKI on [name]4   Baseline EGFR ~15-20 (cr ~2.6), follows with [facility]. [name] attributed to hypertension and diabetes. 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 Admitted with an AKI, Cr 3.5-3.6, currently stable. Urine sediment evaluated today does not show any casts or indication of ATN. Does show numerous wbc's and bacteria (this is expected from external foley catheter). Suspect that she has a hemodynamic injury, either pre-renal or volume overload. She is warm and well compensated, and feels that she is at her baseline volume status. Has received 2 doses of IV diuretics since admission, but unclear how effective this diuresis was.     #Azotemia, ?uremic syndrome  BUN is elevated, and has mild asterixis, but reports symptoms are improving. Hopeful that with conservative management, this will improve.     RECOMMENDATIONS:   - Please start recording daily standing weights and strict measurement of intake/output  - Difficult to determine volume status. Favor holding diuretics for next 1-2 days and monitoring renal function  - Recommend renal US given history of kidney stones and to rule out urinary retention.   - Discussed high risk of renal failure in the future with patient and daughter. No dialysis indications at this time, will opt for conservative management for now. Discussed option of considering dialysis vs conservative management without dialysis in future given her age and co-morbidities.   - We will continue to follow.     Tonye Royalty, MD  02/16/2023 3:36 PM     Medical decision-making for 02/16/23  Findings / Data     Patient has: []  acute illness w/systemic sxs  [mod]  []  two or more stable chronic illnesses [mod]  []  one chronic illness with acute exacerbation [mod]  []  acute complicated illness  [mod]  []  Undiagnosed new problem with uncertain prognosis  [mod] [x]  illness posing risk to life or bodily function (ex. AKI)  [high]  [x]  chronic illness with severe exacerbation/progression  [high]  []  chronic illness with severe side effects of treatment  [high] AKI, [name], HF Probs At least 2:  Probs, Data, Risk   I reviewed: [x]  primary team note  []  consultant note(s)  []  external records [x]  chemistry results  [x]  CBC results  []  blood gas results  []  Other []  procedure/op note(s)   []  radiology report(s)  []  micro result(s)  []  w/ independent historian(s) Hgb normal, elevated Cr and BUN ?3 Data Review (2 of 3)    I independently interpreted: [x]  Urine Sediment  []  Renal US [x]  CXR Images  []  CT Images  []  Other []  EKG Tracing CXR on admission with mild edema, urine sediment Any     I discussed: []  Pathology results w/ QHPs(s) from other specialties  []  Procedural findings w/ QHPs(s) from other specialties []  Imaging w/ QHP(s) from other specialties  [x]  Treatment plan w/ QHP(s) from other specialties Plan discussed with primary team Any     Mgm't requires: [x]  Prescription drug(s)  [mod]  []  Kidney biopsy  [mod]  []  Central line placement  [mod] []  High risk medication use and/or intensive toxicity monitoring [high]  [x]  Renal replacement therapy [high]  []  High risk kidney biopsy  [high]  []  Escalation of care  [high]  []  High risk central line placement  [high] PO diuretics,  discussed risk of dialysis need Risk      ____________________________________________________    History of Present Illness: Barbara Huber is 82 y.o. female with cardiac TTR amyloidosis, [name], [name], Afib, [name], COPD on 3 L [place] baseline who is seen in consultation at the request of Melissa Harlin Rain, MD and Med Undesignated (MDX). Nephrology has been consulted for AKI.     Last seen by Dr. Loreli Slot for [name]4 (EGFR 15-20) in October, attributed to diabetes and hypertension. Her Cr was last checked by cardiology on 2/14- it was at her baseline of 2.6. She was continued on her home torsemide. She was admitted 2/18 after a fall, has a non-displaced hip fracture. Cr noted to be elevated to 3.6 on admission and has remained stable. She was given 40 IV lasix on admission and 80 IV lasix yesterday. INtake/output not recorded, and has not had daily weights. She reports that she feels that her volume status is at baseline. No edema or change in dyspnea, possible increase in abdominal distension, but she has not had bowel movement.   She denies NSAID use.         INPATIENT MEDICATIONS:    Current Facility-Administered Medications:     acetaminophen (TYLENOL) tablet 1,000 mg, Oral, Q8H PRN    albuterol (PROVENTIL HFA;VENTOLIN HFA) 90 mcg/actuation inhaler 2 puff, Inhalation, Q8H PRN    amiodarone (PACERONE) tablet 200 mg, Oral, Daily    atorvastatin (LIPITOR) tablet 20 mg, Oral, Nightly    dextrose (D10W) 10% bolus 125 mL, Intravenous, Q10 Min PRN    fluticasone furoate-vilanterol (BREO ELLIPTA) 200-25 mcg/dose inhaler 1 puff, Inhalation, Daily (RT)    gabapentin (NEURONTIN) capsule 300 mg, Oral, Daily    glucagon injection 1 mg, Intramuscular, Once PRN    glucose chewable tablet 16 g, Oral, Q10 Min PRN    heparin (porcine) 5,000 unit/mL injection 5,000 Units, Subcutaneous, Q8H SCH    insulin glargine (LANTUS) injection, Subcutaneous, Nightly    insulin lispro (HumaLOG) injection 0-20 Units, Subcutaneous, ACHS    montelukast (SINGULAIR) tablet 10 mg, Oral, Q AM    oxyCODONE (OxyCONTIN) 12 hr crush resistant ER/CR tablet 10 mg, Oral, Q12H SCH    pantoprazole (Protonix) EC tablet 20 mg, Oral, Daily    polyethylene glycol (MIRALAX) packet 17 g, Oral, Daily    senna (SENOKOT) tablet 1 tablet, Oral, Nightly    spironolactone (ALDACTONE) tablet 25 mg, Oral, Daily    tafamidis (VYNDAMAX) 61 mg capsule **Patient Supplied**, Oral, Daily    umeclidinium (INCRUSE ELLIPTA) 62.5 mcg/actuation inhaler 1 puff, Inhalation, Daily (RT)    OUTPATIENT MEDICATIONS:  Prior to Admission medications    Medication Dose, Route, Frequency   albuterol HFA 90 mcg/actuation inhaler 2 puffs, Inhalation, Every 8 hours PRN   amiodarone (PACERONE) 200 MG tablet 200 mg, Oral, Daily (standard)   estradioL (ESTRACE) 0.01 % (0.1 mg/gram) vaginal cream 2 g, Vaginal, 2 times a week   fluticasone propion-salmeteroL (ADVAIR HFA) 115-21 mcg/actuation inhaler 2 puffs, Inhalation, 2 times a day (standard)   fosfomycin (MONUROL) 3 gram Pack 3 g, Oral, Weekly   gabapentin (NEURONTIN) 300 MG capsule 300 mg, Oral, Daily   insulin glargine (BASAGLAR, LANTUS) 100 unit/mL (3 mL) injection pen 4 Units, Subcutaneous, Nightly   metOLazone (ZAROXOLYN) 5 MG tablet 5 mg, Oral, Daily PRN   montelukast (SINGULAIR) 10 mg tablet 10 mg, Oral, Every morning  Patient taking differently: 1 tablet (10 mg total) every morning.   OXYCONTIN 10 mg TR12 12 hr crush resistant ER/CR  tablet 10 mg, Oral, Every 12 hours   pantoprazole (PROTONIX) 20 MG tablet 20 mg, Oral, Daily (standard)   rosuvastatin (CRESTOR) 5 MG tablet 5 mg, Oral, Every other day   spironolactone (ALDACTONE) 25 MG tablet 25 mg, Oral, Daily (standard)   tafamidis (VYNDAMAX) 61 mg cap Take 1 capsule (61 mg) by mouth daily.   tiotropium bromide (SPIRIVA RESPIMAT) 2.5 mcg/actuation inhalation mist 2 puffs, Inhalation, Daily (standard)   torsemide (DEMADEX) 20 MG tablet 60 mg, Oral, Daily (standard)   acetaminophen (TYLENOL) 500 MG tablet 1,000 mg, Daily PRN  Patient not taking: Reported on 02/14/2023   blood sugar diagnostic (ACCU-CHEK GUIDE TEST STRIPS) Strp Use to check blood sugar daily as instructed by clinic. Dx E11.9 For daily testing   blood-glucose meter kit Use as instructed Daily testing   cranberry 500 mg cap 500 mg, Oral, Daily   lancets Misc 1 each, Miscellaneous, Daily (standard), Accu Check   NARCAN 4 mg/actuation nasal spray 1 spray, Once as needed  Patient not taking: Reported on 12/04/2022   NON FORMULARY APPLY FROM NECK DOWN TWICE A DAY   OXYGEN-AIR DELIVERY SYSTEMS MISC 5 L, Miscellaneous, Currently using 3  L/min via [place]   pen needle, diabetic (BD ULTRA-FINE SHORT PEN NEEDLE) 31 gauge x 5/16 (8 mm) Ndle Give 4 units of insulin each night   sertraline (ZOLOFT) 25 MG tablet 25 mg, Oral, Daily (standard)  Patient not taking: Reported on 02/14/2023   varicella-zoster [id number], PF, (SHINGRIX, PF,) 50 mcg/0.5 mL SusR injection 0.5 mL, Intramuscular  Patient not taking: Reported on 12/04/2022        ALLERGIES:  Nitrofurantoin and Lipitor [atorvastatin]    MEDICAL HISTORY:  Past Medical History:   Diagnosis Date    Acute kidney injury superimposed on chronic kidney disease (CMS-HCC) 10/11/2020    Acute on chronic diastolic (congestive) heart failure (CMS-HCC) 08/23/2020    AKI (acute kidney injury) (CMS-HCC) 04/14/2015    Lab Results  Component  Value  Date     CREATININE  1.90 (H)  06/12/2021     Had a bump in her creatinine when she was taking her diuretics every day.  She is currently taking 40 mg daily of torsemide and 50 mg of spironolactone.  Her volume status is fragile.  Previously when she stopped her diuretic she becomes short of breath.  Plan: We will check her BMP today.  We will likely have to go to 40    Arthritis     Calculus of kidney     Calculus of ureter     CHF (congestive heart failure) (CMS-HCC)     Chronic atrial fibrillation (CMS-HCC) 07/20/2019    COPD (chronic obstructive pulmonary disease) (CMS-HCC)     Diabetes (CMS-HCC)     Gangrenous cholecystitis 10/11/2020    Generalized edema  06/17/2021    GERD (gastroesophageal reflux disease)     Hydronephrosis     Hypertension     Hyponatremia 10/11/2020    Intermediate coronary syndrome (CMS-HCC) 03/13/2014    Lower extremity edema 09/28/2020    Lumbar stenosis     Microscopic hematuria     Nausea alone     Nephrolithiasis 04/17/2016    Neuropathy     Nocturia     Nocturia 07/01/2017    Other chronic cystitis     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  See plans for anemia, AKI  We are also tapering her gabapentin.  Currently on 300  mg 3 times daily.  We will decrease it to twice daily, and then nightly.  She states she is not having any recurrence of her pain.    Pulmonary hypertension (CMS-HCC)     Renal colic     Shortness of breath 04/28/2020    Sleep apnea     Unstable angina pectoris (CMS-HCC) 03/13/2014     Past Surgical History:   Procedure Laterality Date    BACK SURGERY  1995    CARPAL TUNNEL RELEASE Left 2014    HYSTERECTOMY  1971    IR INSERT CHOLECYSTOSMY TUBE PERCUTANEOUS  10/02/2020    IR INSERT CHOLECYSTOSMY TUBE PERCUTANEOUS 10/02/2020 Braulio Conte, MD IMG VIR [facility]V [facility]    LUMBAR DISC SURGERY      PR REMOVAL GALLBLADDER N/A 10/06/2020    Procedure: CHOLECYSTECTOMY;  Surgeon: Katherina Mires, MD;  Location: MAIN OR [facility];  Service: Trauma    PR RIGHT HEART CATH O2 SATURATION & CARDIAC OUTPUT N/A 09/30/2020    Procedure: Right Heart Catheterization;  Surgeon: Neal Dy, MD;  Location: [facility] CATH;  Service: Cardiology    PR RIGHT HEART CATH O2 SATURATION & CARDIAC OUTPUT N/A 01/09/2021    Procedure: Right Heart Catheterization;  Surgeon: Lesle Reek, MD;  Location: [facility] CATH;  Service: Cardiology     SOCIAL HISTORY  Social History     Social History Narrative    Previously worked in Designer, fashion/clothing, retired.  Daughter is Economist who works at [employer].  Her niece is Selena Batten, [employer] Anesthesia Tech.  Lives in [place] with daughter and son-in-law.      reports that she quit smoking about 5 years ago. Her smoking use included cigarettes. She has never used smokeless tobacco. She reports that she does not drink alcohol and does not use drugs.   FAMILY HISTORY  Family History   Problem Relation Age of Onset    Hypertension Mother     Cancer Father         COLON CANCER    Anesthesia problems Neg Hx     Broken bones Neg Hx     Clotting disorder Neg Hx     Collagen disease Neg Hx     Diabetes Neg Hx Dislocations Neg Hx     Fibromyalgia Neg Hx     Gout Neg Hx     Hemophilia Neg Hx     Osteoporosis Neg Hx     Rheumatologic disease Neg Hx     Scoliosis Neg Hx     Severe sprains Neg Hx     Sickle cell anemia Neg Hx     Spinal Compression Fracture Neg Hx     GU problems Neg Hx     Kidney cancer Neg Hx     Prostate cancer Neg Hx         Physical Exam:   Vitals:    02/16/23 0749 02/16/23 0912 02/16/23 1300 02/16/23 1503   BP: 116/57   141/69   Pulse: 61 61  64   Resp: 18 18  17    Temp: 36.5 ??C (97.7 ??F)   36.6 ??C (97.9 ??F)   TempSrc: Temporal   Temporal   SpO2: 91% 94%  95%   Weight:   98.3 kg (216 lb 11.2 oz)    Height:         No intake/output data recorded.  No intake or output data in the 24 hours ending 02/16/23 1536  Constitutional: chronically ill-appearing, no acute distress  Heart: RRR, no m/r/g  Lungs: distant lung sounds, normal wob at rest on 3 L [place]  Abd: soft, non-tender, non-distended  Ext: no peripheral edema

## 2023-02-16 NOTE — Unmapped (Addendum)
Pt got admitted for shortness of breath and fall. Alert and oriented x 4. On room air. Skin and fall precautions maintained. On telemetry monitoring. Standing weight checked and recorded. Blood glucose well controlled. Pain well controlled. Verbalized plan of care. Call bell within reach. Working towards prioritized goals.    12 hour chart check completed.    Problem: Adult Inpatient Plan of Care  Goal: Plan of Care Review  Outcome: Progressing     Problem: Adult Inpatient Plan of Care  Goal: Patient-Specific Goal (Individualized)  Outcome: Progressing     Problem: Adult Inpatient Plan of Care  Goal: Absence of Hospital-Acquired Illness or Injury  Intervention: Identify and Manage Fall Risk  Recent Flowsheet Documentation  Taken 02/16/2023 1200 by Lyndal Pulley, RN  Safety Interventions:   fall reduction program maintained   low bed   nonskid shoes/slippers when out of bed  Taken 02/16/2023 1000 by Lyndal Pulley, RN  Safety Interventions:   fall reduction program maintained   low bed   nonskid shoes/slippers when out of bed  Taken 02/16/2023 0800 by Lyndal Pulley, RN  Safety Interventions:   fall reduction program maintained   low bed   nonskid shoes/slippers when out of bed   6CIT Score for Cognitive Screening:  Complete for all new admissions, scoring found on the back of the Kardex  [x] Normal (<8)    [] Significant (8 or higher)  [] Not yet completed this admission    CAM Delirium Screening Tool  Be sure to ask questions to gauge orientation and attention during your assessment  Feature Present? Scoring: CAM Positive if:   Acute onset (different from baseline) with Fluctuating course [] Yes    [x] No   Yes to BOTH of these AND.Marland KitchenMarland Kitchen   Inattention (days of the week or months of the year backward) [] Yes    [x] No    Disorganized thinking [] Yes    [x] No   Yes to at least   ONE of these   Altered level of consciousness  [] Yes    [x] No    CAM []  Positive  [x] Negative    CAM-S: Symptom Severity Score  Complete on all patients ?82 years old, including in patients with dementia  Do not complete if the patient is known to be non-verbal due to their dementia  Feature  Severity Score  Question   ACUTE ONSET  & FLUCTUATING  COURSE [x] No (0)    [] Yes (1)    Is there evidence of an acute change in mental status from the patient's baseline? Did the patient's behavior fluctuate at any point during the interview for any of the 10 features?   2. INATTENTION [x] No (0)    [] Yes, mild (1)  [] Yes, marked (2)  Did the patient have difficulty focusing attention, for example being easily distractible, or having difficulty keeping track of what was being said?   3. DISORGANIZED  THINKING [x] No (0)    [] Yes, mild (1)  [] Yes, marked (2)  Was the patient's thinking disorganized or incoherent, such as rambling or irrelevant conversation, unclear or illogical flow or of ideas, unpredictable switching from subject to subject?   4. ALTERED LEVEL  OF  CONSCIOUSNESS [x] Normal (0)    [] Mild: vigilant or lethargic (1)  [] Marked: stupor or coma (2)    Overall, how would you rate the patient's level of consciousness?   -Alert (normal)   -Vigilant  -Lethargic  -Stupor  -Coma  -Uncertain   5. DISORIENTATION [x] No (0)    [] Yes, mild (1)  [] Yes,  marked (2)  Was the patient disoriented at any time during the interview, such as thinking he/she was somewhere other than the hospital, using the wrong bed, or misjudging the time of day?   6. MEMORY  IMPAIRMENT [x] No (0)    [] Yes, mild (1)  [] Yes, marked (2)    Did the patient demonstrate any memory problems during the interview, such as inability to remember events in the hospital or difficulty remembering instructions?   7. PERCEPTUAL  DISTURBANCES [x] No (0)    [] Yes, mild (1)  [] Yes, marked (2)  Did the patient have any evidence of perceptual disturbances, for example, hallucinations, illusions, or misinterpretations (such as thinking something was moving when it was not)?   8. PSYCHOMOTOR  AGITATION [x] No (0) [] Yes, mild (1)  [] Yes, marked (2)  At any time during the interview, did the patient have an unusually increased level of motor activity, such as restlessness, picking at bedclothes, tapping fingers, or making frequent sudden changes of position?   9. PSYCHOMOTOR  RETARDATION [x] No (0)    [] Yes, mild (1)  [] Yes, marked (2)  At any time during the interview, did the patient have an unusually decreased level of motor activity, such as sluggishness, staring into space, staying in one position for a long time, or moving very slowly?   10. ALTERED  SLEEP-WAKE  CYCLE  [x] No (0)    [] Yes, mild (1)  [] Yes, marked (2)  Did the patient have evidence of disturbance of the sleep-wake cycle, such as excessive daytime sleepiness with insomnia at   night?   Long Form   SEVERITY SCORE:  Severity Score (add rows 1-10)  Total (0-19): 0    Scoring the CAM-S: Rate each symptom of delirium listed in the CAM instrument as absent (0), mild (1), marked (2). Acute onset or fluctuation is rated as absent (0) or present (1). Summarize these scores into a composite.

## 2023-02-17 LAB — BASIC METABOLIC PANEL
ANION GAP: 10 mmol/L (ref 5–14)
BLOOD UREA NITROGEN: 109 mg/dL — ABNORMAL HIGH (ref 9–23)
BUN / CREAT RATIO: 31
CALCIUM: 9 mg/dL (ref 8.7–10.4)
CHLORIDE: 101 mmol/L (ref 98–107)
CO2: 24.6 mmol/L (ref 20.0–31.0)
CREATININE: 3.48 mg/dL — ABNORMAL HIGH
EGFR CKD-EPI (2021) FEMALE: 13 mL/min/{1.73_m2} — ABNORMAL LOW (ref >=60)
GLUCOSE RANDOM: 102 mg/dL (ref 70–179)
POTASSIUM: 4.3 mmol/L (ref 3.4–4.8)
SODIUM: 136 mmol/L (ref 135–145)

## 2023-02-17 LAB — MAGNESIUM: MAGNESIUM: 2.8 mg/dL — ABNORMAL HIGH (ref 1.6–2.6)

## 2023-02-17 MED ADMIN — lidocaine 4 % patch 1 patch: 1 | TRANSDERMAL | @ 16:00:00

## 2023-02-17 MED ADMIN — oxyCODONE (OxyCONTIN) 12 hr crush resistant ER/CR tablet 10 mg: 10 mg | ORAL | @ 01:00:00 | Stop: 2023-02-28

## 2023-02-17 MED ADMIN — oxyCODONE (OxyCONTIN) 12 hr crush resistant ER/CR tablet 10 mg: 10 mg | ORAL | @ 16:00:00 | Stop: 2023-02-28

## 2023-02-17 MED ADMIN — atorvastatin (LIPITOR) tablet 20 mg: 20 mg | ORAL | @ 01:00:00

## 2023-02-17 MED ADMIN — polyethylene glycol (MIRALAX) packet 17 g: 17 g | ORAL | @ 16:00:00

## 2023-02-17 MED ADMIN — pantoprazole (Protonix) EC tablet 20 mg: 20 mg | ORAL | @ 16:00:00

## 2023-02-17 MED ADMIN — acetaminophen (TYLENOL) tablet 1,000 mg: 1000 mg | ORAL | @ 20:00:00

## 2023-02-17 MED ADMIN — gabapentin (NEURONTIN) capsule 300 mg: 300 mg | ORAL | @ 16:00:00

## 2023-02-17 MED ADMIN — heparin (porcine) 5,000 unit/mL injection 5,000 Units: 5000 [IU] | SUBCUTANEOUS | @ 03:00:00

## 2023-02-17 MED ADMIN — insulin glargine (LANTUS) injection: 4 [IU] | SUBCUTANEOUS | @ 01:00:00

## 2023-02-17 MED ADMIN — fluticasone furoate-vilanterol (BREO ELLIPTA) 200-25 mcg/dose inhaler 1 puff: 1 | RESPIRATORY_TRACT | @ 14:00:00

## 2023-02-17 MED ADMIN — amiodarone (PACERONE) tablet 200 mg: 200 mg | ORAL | @ 16:00:00

## 2023-02-17 MED ADMIN — heparin (porcine) 5,000 unit/mL injection 5,000 Units: 5000 [IU] | SUBCUTANEOUS | @ 20:00:00

## 2023-02-17 MED ADMIN — tafamidis (VYNDAMAX) 61 mg capsule **Patient Supplied**: 1 | ORAL | @ 16:00:00

## 2023-02-17 MED ADMIN — senna (SENOKOT) tablet 1 tablet: 1 | ORAL | @ 01:00:00

## 2023-02-17 MED ADMIN — umeclidinium (INCRUSE ELLIPTA) 62.5 mcg/actuation inhaler 1 puff: 1 | RESPIRATORY_TRACT | @ 14:00:00

## 2023-02-17 MED ADMIN — heparin (porcine) 5,000 unit/mL injection 5,000 Units: 5000 [IU] | SUBCUTANEOUS | @ 11:00:00

## 2023-02-17 MED ADMIN — spironolactone (ALDACTONE) tablet 25 mg: 25 mg | ORAL | @ 16:00:00

## 2023-02-17 NOTE — Unmapped (Signed)
Pt vitals remain stable and pt tolerating meds well. Bed locked and low and call bell within reach. No Humalog given for blood glucose. No significant events to report.    24 hr chart hceck compelte    Problem: Adult Inpatient Plan of Care  Goal: Plan of Care Review  Outcome: Progressing  Goal: Patient-Specific Goal (Individualized)  Outcome: Progressing  Goal: Absence of Hospital-Acquired Illness or Injury  Outcome: Progressing  Intervention: Identify and Manage Fall Risk  Recent Flowsheet Documentation  Taken 02/16/2023 2200 by Sherrie Mustache, RN  Safety Interventions:   low bed   fall reduction program maintained   nonskid shoes/slippers when out of bed  Taken 02/16/2023 2000 by Sherrie Mustache, RN  Safety Interventions:   low bed   fall reduction program maintained   nonskid shoes/slippers when out of bed  Intervention: Prevent Skin Injury  Recent Flowsheet Documentation  Taken 02/16/2023 2200 by Sherrie Mustache, RN  Positioning for Skin: Supine/Back  Skin Protection: adhesive use limited  Taken 02/16/2023 2000 by Sherrie Mustache, RN  Positioning for Skin: Supine/Back  Skin Protection: adhesive use limited  Goal: Optimal Comfort and Wellbeing  Outcome: Progressing  Goal: Readiness for Transition of Care  Outcome: Progressing  Goal: Rounds/Family Conference  Outcome: Progressing     Problem: Fall Injury Risk  Goal: Absence of Fall and Fall-Related Injury  Outcome: Progressing  Intervention: Promote Scientist, clinical (histocompatibility and immunogenetics) Documentation  Taken 02/16/2023 2200 by Sherrie Mustache, RN  Safety Interventions:   low bed   fall reduction program maintained   nonskid shoes/slippers when out of bed  Taken 02/16/2023 2000 by Sherrie Mustache, RN  Safety Interventions:   low bed   fall reduction program maintained   nonskid shoes/slippers when out of bed     Problem: Self-Care Deficit  Goal: Improved Ability to Complete Activities of Daily Living  Outcome: Progressing

## 2023-02-17 NOTE — Unmapped (Signed)
Nephrology Follow-Up Consult Note    Reason for Consult: AKI     Assessment and Plan:    # AKI on [name]4   Baseline EGFR ~15-20 (cr ~2.6), follows with [facility]. [name] attributed to hypertension and diabetes. 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 Admitted with an AKI, Cr 3.5-3.6, currently stable. Normal renal US. Urine sediment evaluated 2/20 (from external foley cateter) did not show any casts or indication of ATN. Suspect that she has a hemodynamic injury, either pre-renal or volume overload (difficult volume status exam). She is warm and well compensated, and feels that she is at her baseline volume status. Had received 2 doses of IV diuretics on first 2 days of admission without change in Cr, but unclear how effective this diuresis was as intake/output was not recorded. Her weight and symptoms are stable between yesterday and today after holding diuretics. Would be in favor of holding one more day, and then can consider starting home diuretic dose tomorrow.   I did discuss high risk of renal failure in the future with patient and her daughter. No dialysis indications at this time, will opt for conservative management for now. I also discussed option of considering conservative management without dialysis in the future given her age and co-morbidities.   Has follow up with [facility] on March 14.      #Azotemia, ?uremia  BUN is elevated, and had mild asterixis during admission, but symptoms are now improved. BUN is stable. No longer with asterixis, at baseline mental status which is excellent.     RECOMMENDATIONS:   - Continue daily standing weights and strict measurement of intake/output  - Volume status exam is difficult. Favor holding diuretics for an additional day.   - We will continue to follow.     Tonye Royalty, MD  02/17/2023 12:13 PM     Medical decision-making for 02/17/23  Findings / Data     Patient has: []  acute illness w/systemic sxs  [mod]  []  two or more stable chronic illnesses [mod]  []  one chronic illness with acute exacerbation [mod]  []  acute complicated illness  [mod]  []  Undiagnosed new problem with uncertain prognosis  [mod] [x]  illness posing risk to life or bodily function (ex. AKI)  [high]  [x]  chronic illness with severe exacerbation/progression  [high]  []  chronic illness with severe side effects of treatment  [high] AKI on [name] Probs At least 2:  Probs, Data, Risk   I reviewed: [x]  primary team note  []  consultant note(s)  []  external records [x]  chemistry results  []  CBC results  []  blood gas results  []  Other []  procedure/op note(s)   []  radiology report(s)  []  micro result(s)  []  w/ independent historian(s) Elevated CR and BUN ?3 Data Review (2 of 3)    I independently interpreted: []  Urine Sediment  []  Renal US []  CXR Images  []  CT Images  []  Other []  EKG Tracing  Any     I discussed: []  Pathology results w/ QHPs(s) from other specialties  []  Procedural findings w/ QHPs(s) from other specialties []  Imaging w/ QHP(s) from other specialties  [x]  Treatment plan w/ QHP(s) from other specialties Plan discussed with primary team Any     Mgm't requires: [x]  Prescription drug(s)  [mod]  []  Kidney biopsy  [mod]  []  Central line placement  [mod] []  High risk medication use and/or intensive toxicity monitoring [high]  [x]  Renal replacement therapy [high]  []  High risk kidney biopsy  [  high]  []  Escalation of care  [high]  []  High risk central line placement  [high] PO diuretics, discussed risk of renal replacement therapy Risk      _____________________________________________________________________________________    Subjective/Interval Events:   Not given diuretics yesterday. Weight is stable at 98.6 kg. She feels no change in symptoms. Was able to breath comfortably while sleeping last night. No longer has asterixis. Feels well.      Physical Exam:   Vitals:    02/17/23 0001 02/17/23 0603 02/17/23 0800 02/17/23 0921   BP:   143/65    Pulse: 62  61 64   Resp:   18 18   Temp:   36.4 ??C (97.5 ??F)    TempSrc:   Temporal    SpO2:   94% 95%   Weight:  98.6 kg (217 lb 4.8 oz)     Height:         No intake/output data recorded.    Intake/Output Summary (Last 24 hours) at 02/17/2023 1213  Last data filed at 02/16/2023 1338  Gross per 24 hour   Intake 200 ml   Output --   Net 200 ml     Constitutional: elderly pleasant, chronically ill-appearing, no acute distress  Heart: RRR, no m/r/g  Lungs: distant lung sounds, normal wob at rest on 3 L [place]  Abd: soft, non-tender, non-distended  Ext: no peripheral edema

## 2023-02-17 NOTE — Unmapped (Signed)
I have personally seen and evaluated the patient. I have reviewed all pertinent imaging and discussed the plan of care with the PA. I agree with the PA's documentation.    Please see orthopaedic consult note from 02/16/23 for recommendations. Plan for outpatient follow up.     Tilda Burrow, MD

## 2023-02-17 NOTE — Unmapped (Signed)
Medicine Daily Progress Note    Assessment/Plan:  Principal Problem:    Dyspepsia  Active Problems:    COPD (chronic obstructive pulmonary disease) (CMS-HCC)    Essential hypertension    Type 2 diabetes mellitus with stage 4 chronic kidney disease, with long-term current use of insulin (CMS-HCC)    Peripheral neuropathy    Chronic respiratory failure with hypoxia (CMS-HCC)    Anemia in chronic kidney disease    Chronic kidney disease ([name]), stage IV (severe) (CMS-HCC)    Acute on chronic diastolic congestive heart failure (CMS-HCC)    Chronic prescription opiate use    Cardiac amyloidosis (CMS-HCC)    Left hip pain  Resolved Problems:    * No resolved hospital problems. *           Barbara Huber is a 82 y.o. female who [facility] with Dyspepsia.    Acute hypoxic respiratory failure  Acute on chronic CHF with preserved EF   Cardiac amyloidosis (CMS-HCC)  Dry weight 213-214 lbs. S/p furosemide 40mg  IV x 1 2/18, 80mg  IV 2/19, diuretics since on hold. Weight about the same today. TTE essentially unchanged from prior.   -Hold lasix today. Consider resuming home torsemide tomorrow.   -Continue strict Is/Os, daily weights.   -Continue home Tafamidis.  -Continue spironolactone.   -Has not tolerated ARB/beta-blocker in the past.  No Jardiance due to recurrent UTIs.     Acute Kidney Injury on Chronic kidney disease ([name]), stage IV (severe) (CMS-HCC): Baseline Cr ~2.5, stable at 3.6, BUN 102 with concern for myoclonus possibly due to uremia. Renal US wnl. Nephrology consulted, appreciate recommendations. No evidence of ATN in urine sediment.   -Hold diuretics today.  -Strict Is/Os.  -Monitor daily BMP.   -Follow-up nephrology recommendations.      Atrial fibrillation  Junctional bradycardia   H/o persistent afib s/p cardioversion and initiation of amiodarone 2022. Junctional rhythm on initial EKG, resolved, not felt to have contributed to pt's fall. TSH wnl.   -Continue amiodarone  -Telemetry.      Non-displaced hip fracture  CT demonstrating nondisplaced fractures of left superior and inferior pubic rami felt to be due to a mechanical fall. Appreciate ortho consultation.   -WBAT.  -Continue PT/OT. PT rec rehab but family is opting for home with home health. They can provide 24/7 care.  -Follow-up vitamin D level.      COPD (chronic obstructive pulmonary disease) (CMS-HCC)  -Continue home inhaler regimen.               Type 2 diabetes mellitus with stage 4 chronic kidney disease, with long-term current use of insulin (CMS-HCC)   -Continue home Lantus 4 units nightly  -Sliding scale insulin        Peripheral neuropathy  Radiculopathy  Chronic pain  Followed by Pain Management (Dr Ronita Hipps, [facility] [place]).  -Continue home oxycontin 10mg  q12h. Monitor for sedation in the setting of renal failure.   -Continue gabapentin.      PPX  Heparin     Code status  Full code     Disposition  Pending stable renal function. Possible home with home health 2/22.     ___________________________________________________________________    Subjective:  No acute events overnight. Feels well overall. Urinating without difficulty. Daughter at bedside, reports pt has been more alert/awake.     Labs/Studies:  All lab results last 24 hours:    Recent Results (from the past 24 hour(s))   POCT Glucose  Collection Time: 02/16/23  5:08 PM   Result Value Ref Range    Glucose, POC 157 70 - 179 mg/dL   POCT Glucose    Collection Time: 02/16/23  8:03 PM   Result Value Ref Range    Glucose, POC 154 70 - 179 mg/dL   Basic Metabolic Panel    Collection Time: 02/17/23  5:35 AM   Result Value Ref Range    Sodium 136 135 - 145 mmol/L    Potassium 4.3 3.4 - 4.8 mmol/L    Chloride 101 98 - 107 mmol/L    CO2 24.6 20.0 - 31.0 mmol/L    Anion Gap 10 5 - 14 mmol/L    BUN 109 (H) 9 - 23 mg/dL    Creatinine 1.61 (H) 0.55 - 1.02 mg/dL    BUN/Creatinine Ratio 31     eGFR [name]-EPI (2021) Female 13 (L) >=60 mL/min/1.4m2    Glucose 102 70 - 179 mg/dL Calcium 9.0 8.7 - 09.6 mg/dL   Magnesium Level    Collection Time: 02/17/23  5:35 AM   Result Value Ref Range    Magnesium 2.8 (H) 1.6 - 2.6 mg/dL   POCT Glucose    Collection Time: 02/17/23  8:37 AM   Result Value Ref Range    Glucose, POC 94 70 - 179 mg/dL   POCT Glucose    Collection Time: 02/17/23 11:34 AM   Result Value Ref Range    Glucose, POC 186 (H) 70 - 179 mg/dL               Objective:  Temp:  [36.4 ??C (97.5 ??F)-36.6 ??C (97.9 ??F)] 36.4 ??C (97.5 ??F)  Heart Rate:  [61-68] 64  Resp:  [17-18] 18  BP: (123-143)/(59-69) 143/65  SpO2:  [93 %-95 %] 95 %    GEN: No acute distress  HEENT: Mucous membranes moist  CV: Regular rate and rhythm, no murmurs, rubs, or gallops  PULM: Clear to auscultation bilaterally  ABD: Soft, non-tender, non-distended, positive bowel sounds, no rebound or guarding  EXT: Trace bilateral lower extremity edema  NEURO: Alert, answering questions appropriately

## 2023-02-18 LAB — COMPREHENSIVE METABOLIC PANEL
ALBUMIN: 3.4 g/dL (ref 3.4–5.0)
ALKALINE PHOSPHATASE: 88 U/L (ref 46–116)
ALT (SGPT): 10 U/L (ref 10–49)
ANION GAP: 10 mmol/L (ref 5–14)
AST (SGOT): 16 U/L (ref <=34)
BILIRUBIN TOTAL: 0.8 mg/dL (ref 0.3–1.2)
BLOOD UREA NITROGEN: 102 mg/dL — ABNORMAL HIGH (ref 9–23)
BUN / CREAT RATIO: 32
CALCIUM: 9.1 mg/dL (ref 8.7–10.4)
CHLORIDE: 103 mmol/L (ref 98–107)
CO2: 23.6 mmol/L (ref 20.0–31.0)
CREATININE: 3.19 mg/dL — ABNORMAL HIGH
EGFR CKD-EPI (2021) FEMALE: 14 mL/min/{1.73_m2} — ABNORMAL LOW (ref >=60)
GLUCOSE RANDOM: 95 mg/dL (ref 70–179)
POTASSIUM: 4.6 mmol/L (ref 3.4–4.8)
PROTEIN TOTAL: 7.4 g/dL (ref 5.7–8.2)
SODIUM: 137 mmol/L (ref 135–145)

## 2023-02-18 MED ADMIN — insulin lispro (HumaLOG) injection 0-20 Units: 0-20 [IU] | SUBCUTANEOUS | @ 17:00:00 | Stop: 2023-02-18

## 2023-02-18 MED ADMIN — pantoprazole (Protonix) EC tablet 20 mg: 20 mg | ORAL | @ 13:00:00 | Stop: 2023-02-18

## 2023-02-18 MED ADMIN — heparin (porcine) 5,000 unit/mL injection 5,000 Units: 5000 [IU] | SUBCUTANEOUS | @ 02:00:00

## 2023-02-18 MED ADMIN — lidocaine 4 % patch 1 patch: 1 | TRANSDERMAL | @ 13:00:00 | Stop: 2023-02-18

## 2023-02-18 MED ADMIN — oxyCODONE (OxyCONTIN) 12 hr crush resistant ER/CR tablet 10 mg: 10 mg | ORAL | @ 02:00:00 | Stop: 2023-02-28

## 2023-02-18 MED ADMIN — fluticasone furoate-vilanterol (BREO ELLIPTA) 200-25 mcg/dose inhaler 1 puff: 1 | RESPIRATORY_TRACT | @ 14:00:00 | Stop: 2023-02-18

## 2023-02-18 MED ADMIN — heparin (porcine) 5,000 unit/mL injection 5,000 Units: 5000 [IU] | SUBCUTANEOUS | @ 11:00:00 | Stop: 2023-02-18

## 2023-02-18 MED ADMIN — gabapentin (NEURONTIN) capsule 300 mg: 300 mg | ORAL | @ 13:00:00 | Stop: 2023-02-18

## 2023-02-18 MED ADMIN — amiodarone (PACERONE) tablet 200 mg: 200 mg | ORAL | @ 13:00:00 | Stop: 2023-02-18

## 2023-02-18 MED ADMIN — atorvastatin (LIPITOR) tablet 20 mg: 20 mg | ORAL | @ 02:00:00

## 2023-02-18 MED ADMIN — tafamidis (VYNDAMAX) 61 mg capsule **Patient Supplied**: 1 | ORAL | @ 13:00:00 | Stop: 2023-02-18

## 2023-02-18 MED ADMIN — umeclidinium (INCRUSE ELLIPTA) 62.5 mcg/actuation inhaler 1 puff: 1 | RESPIRATORY_TRACT | @ 14:00:00 | Stop: 2023-02-18

## 2023-02-18 MED ADMIN — montelukast (SINGULAIR) tablet 10 mg: 10 mg | ORAL | @ 02:00:00

## 2023-02-18 MED ADMIN — spironolactone (ALDACTONE) tablet 25 mg: 25 mg | ORAL | @ 13:00:00 | Stop: 2023-02-18

## 2023-02-18 MED ADMIN — senna (SENOKOT) tablet 1 tablet: 1 | ORAL | @ 02:00:00

## 2023-02-18 MED ADMIN — insulin glargine (LANTUS) injection: 4 [IU] | SUBCUTANEOUS | @ 02:00:00

## 2023-02-18 MED ADMIN — heparin (porcine) 5,000 unit/mL injection 5,000 Units: 5000 [IU] | SUBCUTANEOUS | @ 19:00:00 | Stop: 2023-02-18

## 2023-02-18 MED ADMIN — oxyCODONE (OxyCONTIN) 12 hr crush resistant ER/CR tablet 10 mg: 10 mg | ORAL | @ 13:00:00 | Stop: 2023-02-18

## 2023-02-18 MED ADMIN — lidocaine 4 % patch 1 patch: 1 | TRANSDERMAL | @ 20:00:00 | Stop: 2023-02-18

## 2023-02-18 NOTE — Unmapped (Signed)
Pt vitals remain stable and pt tolerating meds well. No PRN meds given. No humalog given per blood glucose orders. Bed locked and low and call bell within reach. Daughter in the room over night. Nothing significant to report.    Problem: Adult Inpatient Plan of Care  Goal: Plan of Care Review  Outcome: Progressing  Goal: Patient-Specific Goal (Individualized)  Outcome: Progressing  Goal: Absence of Hospital-Acquired Illness or Injury  Outcome: Progressing  Intervention: Identify and Manage Fall Risk  Recent Flowsheet Documentation  Taken 02/17/2023 2200 by Sherrie Mustache, RN  Safety Interventions:   low bed   fall reduction program maintained   nonskid shoes/slippers when out of bed  Taken 02/17/2023 2000 by Sherrie Mustache, RN  Safety Interventions:   low bed   fall reduction program maintained   nonskid shoes/slippers when out of bed  Intervention: Prevent Skin Injury  Recent Flowsheet Documentation  Taken 02/17/2023 2200 by Sherrie Mustache, RN  Positioning for Skin: Supine/Back  Skin Protection: adhesive use limited  Taken 02/17/2023 2000 by Sherrie Mustache, RN  Positioning for Skin: Supine/Back  Skin Protection: adhesive use limited  Goal: Optimal Comfort and Wellbeing  Outcome: Progressing  Goal: Readiness for Transition of Care  Outcome: Progressing  Goal: Rounds/Family Conference  Outcome: Progressing     Problem: Fall Injury Risk  Goal: Absence of Fall and Fall-Related Injury  Outcome: Progressing  Intervention: Promote Scientist, clinical (histocompatibility and immunogenetics) Documentation  Taken 02/17/2023 2200 by Sherrie Mustache, RN  Safety Interventions:   low bed   fall reduction program maintained   nonskid shoes/slippers when out of bed  Taken 02/17/2023 2000 by Sherrie Mustache, RN  Safety Interventions:   low bed   fall reduction program maintained   nonskid shoes/slippers when out of bed     Problem: Self-Care Deficit  Goal: Improved Ability to Complete Activities of Daily Living  Outcome: Progressing Problem: Heart Failure  Goal: Optimal Coping  Outcome: Progressing  Goal: Optimal Cardiac Output  Outcome: Progressing  Goal: Stable Heart Rate and Rhythm  Outcome: Progressing  Goal: Optimal Functional Ability  Outcome: Progressing  Goal: Fluid and Electrolyte Balance  Outcome: Progressing  Goal: Improved Oral Intake  Outcome: Progressing  Goal: Effective Oxygenation and Ventilation  Outcome: Progressing  Goal: Effective Breathing Pattern During Sleep  Outcome: Progressing     Problem: Comorbidity Management  Goal: Blood Glucose Levels Within Targeted Range  Outcome: Progressing

## 2023-02-18 NOTE — Unmapped (Signed)
Physician Discharge Summary HBR  4 BT1 [facility]BR  [address]  [place] [address]  Dept: [phone]  Loc: [phone]     Identifying Information:   Gitel Nissley  July 10, 1941  [id number]    Primary Care Physician: Artelia Laroche, MD   Code Status: DNR and DNI    Admit Date: 02/14/2023    Discharge Date: 02/18/2023     Discharge To: Home with Home Health and/or PT/OT    Discharge Service: HBR - HBC: Hospitalist Service #2     Discharge Attending Physician: Herby Abraham, MD    Discharge Diagnoses:  Principal Problem:    Dyspepsia (POA: Yes)  Active Problems:    COPD (chronic obstructive pulmonary disease) (CMS-HCC) (POA: Yes)    Essential hypertension (POA: Yes)    Type 2 diabetes mellitus with stage 4 chronic kidney disease, with long-term current use of insulin (CMS-HCC) (POA: Not Applicable)    Peripheral neuropathy (POA: Yes)    Chronic respiratory failure with hypoxia (CMS-HCC) (POA: Yes)    Anemia in chronic kidney disease (POA: Yes)    Chronic kidney disease ([name]), stage IV (severe) (CMS-HCC) (POA: Yes)    Acute on chronic diastolic congestive heart failure (CMS-HCC) (POA: Yes)    Chronic prescription opiate use (POA: Not Applicable)    Cardiac amyloidosis (CMS-HCC) (POA: Yes)    Left hip pain (POA: Yes)  Resolved Problems:    * No resolved hospital problems. *      Outpatient Provider Follow Up Issues:   Monitor Cr with resumption of home diuretics    Vitamin D level pending    Hospital Course:     Non-displaced hip fracture  Ms. Salsbury presented with a mechanical fall while turning around with her walker, left hip pain.  CT demonstrated nondisplaced fractures of the left superior and inferior pubic rami.  She was seen in consultation with orthopedic surgery.  No operative intervention was recommended.  She is weightbearing as tolerated.  PT and OT were consulted and recommended SNF rehab.  Patient and family opted for her to return home with home health PT/OT and 24/7 supervision. Vitamin D level was checked and is pending at the time of discharge.    Acute hypoxic respiratory failure  Acute on chronic CHF with preserved EF   Cardiac amyloidosis (CMS-HCC)  She complained of exertional dyspnea with home O2 saturations high 80s to low 90s on her home 4 L nasal cannula.  Dry weight 213-214 lbs. she received Lasix 40 mg IV x 1 2/18 and 80 mg IV x 1 2/19, resulting in a rise in her creatinine.  Nephrology was consulted and diuretics were held with improvement in her creatinine and continued drop in her weight down to her baseline.  TTE was essentially unchanged from prior.  Her home torsemide 40 mg a.m., 20 mg p.m., was resumed at discharge.  She was continued on her home spironolactone and Tafamidis.  She has not tolerated ARB/beta-blockers in the past and is not on Jardiance due to recurrent UTIs.     Acute Kidney Injury on Chronic kidney disease ([name]), stage IV (severe)  Baseline creatinine 2.5, creatinine up to 3.66 this admission with diuresis.  Nephrology was consulted, renal ultrasound was within normal limits.  She did complain of myoclonus was was concerning for possible uremia.  Kidney function improved with holding diuretics and there was no evidence of ATN in her urine sediment.  Creatinine down trended and at discharge her home torsemide was resumed.  Nephrology follow-up was arranged.     Atrial fibrillation  Junctional bradycardia   H/o persistent afib s/p cardioversion and initiation of amiodarone 2022. Junctional rhythm was noted on her admission EKG which resolved.  This was not felt to have contributed to her fall as the history was consistent with a mechanical fall, no loss of consciousness.  TSH was within normal limits.  She was monitored on telemetry.       COPD (chronic obstructive pulmonary disease)   She was continued on her home inhaler regimen.              Type 2 diabetes mellitus  She was continued on her home Lantus 4 units nightly, sliding scale insulin.    Peripheral neuropathy  Radiculopathy  Chronic pain  Followed by Pain Management (Dr Ronita Hipps, [facility] [place]).  She was continued on her home OxyContin 10 mg every 12 hours and home gabapentin.    Procedures:  None  No admission procedures for hospital encounter.  ______________________________________________________________________  Discharge Medications:     Your Medication List        CHANGE how you take these medications      acetaminophen 500 MG tablet  Commonly known as: TYLENOL  Take 2 tablets (1,000 mg total) by mouth Three (3) times a day.  What changed:   when to take this  reasons to take this     montelukast 10 mg tablet  Commonly known as: SINGULAIR  Take 1 tablet (10 mg total) by mouth every morning.  What changed: how to take this            CONTINUE taking these medications      ACCU-CHEK GUIDE TEST STRIPS Strp  Generic drug: blood sugar diagnostic  Use to check blood sugar daily as instructed by clinic. Dx E11.9 For daily testing     albuterol 90 mcg/actuation inhaler  Commonly known as: PROVENTIL HFA;VENTOLIN HFA  Inhale 2 puffs every eight (8) hours as needed for wheezing.     amiodarone 200 MG tablet  Commonly known as: PACERONE  TAKE ONE TABLET BY MOUTH ONCE DAILY     blood-glucose meter kit  Use as instructed Daily testing     cranberry 500 mg Cap  Take 500 mg by mouth daily with evening meal.     estradiol 0.01 % (0.1 mg/gram) vaginal cream  Commonly known as: ESTRACE  Insert 2 g into the vagina Two (2) times a week.     fluticasone propion-salmeterol 115-21 mcg/actuation inhaler  Commonly known as: ADVAIR HFA  Inhale 2 puffs Two (2) times a day.     fosfomycin 3 gram Pack  Commonly known as: MONUROL  Take 3 g by mouth once a week.     gabapentin 300 MG capsule  Commonly known as: NEURONTIN  Take 1 capsule (300 mg total) by mouth in the morning.     insulin glargine 100 unit/mL (3 mL) injection pen  Commonly known as: BASAGLAR, LANTUS  Inject 0.04 mL (4 Units total) under the skin nightly.     lancets Misc  1 each by Miscellaneous route daily. Accu Check     metOLazone 5 MG tablet  Commonly known as: ZAROXOLYN  Take 1 tablet (5 mg total) by mouth daily as needed (when instructed by cardiology clinic).     NARCAN 4 mg/actuation nasal spray  Generic drug: naloxone  1 spray into alternating nostrils once as needed (opioid overdose). PRN - Emergency use.  NON FORMULARY  APPLY FROM NECK DOWN TWICE A DAY     OxyCONTIN 10 mg Tr12 12 hr crush resistant ER/CR tablet  Generic drug: oxyCODONE  Take 1 tablet (10 mg total) by mouth every twelve (12) hours.     OXYGEN-AIR DELIVERY SYSTEMS MISC  5 L by Miscellaneous route. Currently using 3  L/min via [place]     pantoprazole 20 MG tablet  Commonly known as: PROTONIX  Take 1 tablet (20 mg total) by mouth daily.     pen needle, diabetic 31 gauge x 5/16 (8 mm) Ndle  Commonly known as: BD ULTRA-FINE SHORT PEN NEEDLE  Give 4 units of insulin each night     rosuvastatin 5 MG tablet  Commonly known as: CRESTOR  Take 1 tablet (5 mg total) by mouth every other day.     SPIRIVA RESPIMAT 2.5 mcg/actuation inhalation mist  Generic drug: tiotropium bromide  Inhale 2 puffs daily.     spironolactone 25 MG tablet  Commonly known as: ALDACTONE  Take 1 tablet (25 mg total) by mouth daily.     torsemide 20 MG tablet  Commonly known as: DEMADEX  Take 3 tablets (60 mg total) by mouth daily.     VYNDAMAX 61 mg Cap  Generic drug: tafamidis  Take 1 capsule (61 mg) by mouth daily.              Allergies:  Nitrofurantoin and Lipitor [atorvastatin]  ______________________________________________________________________  Pending Test Results (if blank, then none):  Pending Labs       Order Current Status    Vitamin D 25 Hydroxy (25OH D2 + D3) In process            Most Recent Labs:  All lab results last 24 hours -   Recent Results (from the past 24 hour(s))   POCT Glucose    Collection Time: 02/17/23  4:25 PM   Result Value Ref Range    Glucose, POC 154 70 - 179 mg/dL   POCT Glucose    Collection Time: 02/17/23  8:04 PM   Result Value Ref Range    Glucose, POC 146 70 - 179 mg/dL   Comprehensive Metabolic Panel    Collection Time: 02/18/23  6:58 AM   Result Value Ref Range    Sodium 137 135 - 145 mmol/L    Potassium 4.6 3.4 - 4.8 mmol/L    Chloride 103 98 - 107 mmol/L    CO2 23.6 20.0 - 31.0 mmol/L    Anion Gap 10 5 - 14 mmol/L    BUN 102 (H) 9 - 23 mg/dL    Creatinine 6.04 (H) 0.55 - 1.02 mg/dL    BUN/Creatinine Ratio 32     eGFR [name]-EPI (2021) Female 14 (L) >=60 mL/min/1.56m2    Glucose 95 70 - 179 mg/dL    Calcium 9.1 8.7 - 54.0 mg/dL    Albumin 3.4 3.4 - 5.0 g/dL    Total Protein 7.4 5.7 - 8.2 g/dL    Total Bilirubin 0.8 0.3 - 1.2 mg/dL    AST 16 <=98 U/L    ALT 10 10 - 49 U/L    Alkaline Phosphatase 88 46 - 116 U/L   POCT Glucose    Collection Time: 02/18/23  7:40 AM   Result Value Ref Range    Glucose, POC 101 70 - 179 mg/dL   POCT Glucose    Collection Time: 02/18/23 11:39 AM   Result Value Ref Range    Glucose, POC  235 (H) 70 - 179 mg/dL       Relevant Studies/Radiology (if blank, then none):  Echocardiogram W Colorflow Spectral Doppler    Result Date: 02/17/2023  Patient Info Name:     Joelynn Bretschneider Age:     3 years DOB:     1941-06-25 Gender:     Female MRN:     [id number] Accession #:     [id number] UN Account #:     [id number] Ht:     168 cm Wt:     99 kg BSA:     2.18 m2 BP:     123 /     59 mmHg HR:     62 bpm Heart Rhythm:     Sinus Rhythm Exam Date:     02/17/2023 7:41 AM Admit Date:     02/14/2023     Exam Type:     ECHOCARDIOGRAM W COLORFLOW SPECTRAL DOPPLER     Technical Quality:     Fair     Staff Sonographer:     Carolin Coy Reading Fellow:     Deitra Mayo     Study Info Indications      - HF exacerbation Procedure(s)   Complete two-dimensional, color flow and Doppler transthoracic echocardiogram is performed.         Summary   1. The left ventricle is normal in size with moderately increased wall thickness.   2. The left ventricular systolic function is hyperdynamic, LVEF is visually estimated at >70%.   3. The left atrium is moderately dilated in size.   4. The right ventricle is normal in size, with normal systolic function.   5. There is mild pulmonary hypertension.         Left Ventricle   The left ventricle is normal in size with moderately increased wall thickness. The left ventricular systolic function is hyperdynamic, LVEF is visually estimated at >70%. Left ventricular diastolic function cannot be accurately assessed.     Right Ventricle   The right ventricle is normal in size, with normal systolic function.         Left Atrium   The left atrium is moderately dilated in size.     Right Atrium   The right atrium is mildly dilated in size.         Aortic Valve   The aortic valve is trileaflet with mildly thickened leaflets with normal excursion. There is no significant aortic regurgitation. There is no evidence of a significant transvalvular gradient.     Mitral Valve   The mitral valve leaflets are normal with normal leaflet mobility. Mitral annular calcification is present (moderate). There is no significant mitral valve regurgitation.     Tricuspid Valve   The tricuspid valve leaflets are normal, with normal leaflet mobility. There is trivial tricuspid regurgitation. There is mild pulmonary hypertension. TR maximum velocity: 3.1 m/s  Estimated PASP: 46 mmHg.     Pulmonic Valve   The pulmonic valve is poorly visualized, but probably normal. There is no significant pulmonic regurgitation. There is no evidence of a significant transvalvular gradient.         Aorta   The aorta is normal in size in the visualized segments.     Inferior Vena Cava   IVC size and inspiratory change suggest normal right atrial pressure. (0-5 mmHg).     Pericardium/Pleural   There is no pericardial effusion.         Ventricles ---------------------------------------------------------------------- Name  Value        Normal ----------------------------------------------------------------------     LV Dimensions 2D/MM ----------------------------------------------------------------------  IVS Diastolic Thickness (2D)                                1.4 cm       0.6-0.9 LVID Diastole (2D)                  4.6 cm       3.8-5.2  LVPW Diastolic Thickness (2D)                                1.3 cm       0.6-0.9 LVID Systole (2D)                   3.0 cm       2.2-3.5     RV Dimensions 2D/MM ----------------------------------------------------------------------  RV Basal Diastolic Dimension                           3.7 cm       2.5-4.1 TAPSE                               1.7 cm         >=1.7     Atria ---------------------------------------------------------------------- Name                                 Value        Normal ----------------------------------------------------------------------     LA Dimensions ---------------------------------------------------------------------- LA Dimension (2D)                   4.0 cm       2.7-3.8 LA Volume Index (4C A-L)        36.48 ml/m2               LA Volume Index (2C A-L)        36.96 ml/m2               LA Volume (BP MOD)                   77 ml               LA Volume Index (BP MOD)        35.33 ml/m2   16.00-34.00     RA Dimensions ---------------------------------------------------------------------- RA Area (4C)                      17.7 cm2        <=18.0 RA Area (4C) Index              8.1 cm2/m2               RA ESV Index (4C MOD)             22 ml/m2         15-27     Left Ventricular Outflow Tract ---------------------------------------------------------------------- Name                                 Value  Normal ----------------------------------------------------------------------     LVOT Doppler ---------------------------------------------------------------------- LVOT Peak Velocity                 1.4 m/s               LVOT VTI                             22 cm Aortic Valve ---------------------------------------------------------------------- Name                                 Value        Normal ----------------------------------------------------------------------     AV Doppler ---------------------------------------------------------------------- AV Peak Velocity                   1.5 m/s               AV Peak Gradient                    9 mmHg               AV DI (Vel)                           0.90     Mitral Valve ---------------------------------------------------------------------- Name                                 Value        Normal ----------------------------------------------------------------------     MV Diastolic Function ---------------------------------------------------------------------- MV E Peak Velocity                 94 cm/s               MV A Peak Velocity                 83 cm/s               MV E/A                                 1.1                   MV Annular TDI ---------------------------------------------------------------------- MV Septal e' Velocity             3.1 cm/s         >=8.0 MV E/e' (Septal)                      30.9               MV Lateral e' Velocity            4.7 cm/s        >=10.0 MV E/e' (Lateral)                     20.1               MV e' Average                     3.9 cm/s               MV E/e' (Average)  25.5     Tricuspid Valve ---------------------------------------------------------------------- Name                                 Value        Normal ----------------------------------------------------------------------     TV Regurgitation Doppler ---------------------------------------------------------------------- TR Peak Velocity                   3.1 m/s                   Estimated PAP/RSVP ---------------------------------------------------------------------- RA Pressure                         8 mmHg           <=5 RV Systolic Pressure               46 mmHg           <36 Pulmonic Valve ---------------------------------------------------------------------- Name                                 Value        Normal ----------------------------------------------------------------------     PV Doppler ---------------------------------------------------------------------- PV Peak Velocity                   1.1 m/s     Aorta ---------------------------------------------------------------------- Name                                 Value        Normal ----------------------------------------------------------------------     Ascending Aorta ---------------------------------------------------------------------- Ao Root Diameter (2D)               3.6 cm               Ao Root Diam Index (2D)          1.6 cm/m2         Report Signatures Finalized by Madaline Savage  MD on 02/17/2023 02:59 PM Preliminary amended by Deitra Mayo on 02/17/2023 09:42 AM Resident Deitra Mayo on 02/17/2023 09:40 AM    US Renal Complete    Result Date: 02/17/2023  EXAM: US RENAL COMPLETE ACCESSION: [id number] UN     CLINICAL INDICATION: 82 years old with AKI on [name], r/o obstruction      COMPARISON: Renal ultrasound 12/07/2021     TECHNIQUE: Static and cine images of the kidneys and bladder were performed.     FINDINGS:     Limited exam due to patient positioning.     KIDNEYS: Normal size and echogenicity. No solid masses or calculi. No hydronephrosis.      Right kidney: 9.0 cm      Left kidney: 9.3 cm     BLADDER: Unremarkable.      Bladder volume prevoid: 255.7 mL         No evidence of hydronephrosis.             XR Pelvis 3 Or More Views    Result Date: 02/15/2023  EXAM: XR PELVIS 3 OR MORE VIEWS DATE: 02/15/2023 3:01 PM ACCESSION: [id number] UN DICTATED: 02/15/2023 3:10 PM INTERPRETATION LOCATION: [facility]     CLINICAL INDICATION: 82 years old Female with L rami fractures    COMPARISON: CT left hip 02/14/2023  TECHNIQUE: AP, inlet, and outlet views of the pelvis.      FINDINGS: Minimally displaced left inferior pubic ramus fracture in unchanged alignment. Nondisplaced left superior pubic ramus fracture is not well visualized by radiograph. No new fracture. Hips, sacroiliac joints, the pubic symphysis are approximated. Degenerative sclerosis and cystic change of the sacroiliac joints. Similar calcifications adjacent to the left proximal femur greater trochanter.         - Minimally displaced left inferior pubic ramus fracture in unchanged alignment. - Previously described nondisplaced left superior pubic ramus fracture is not well visualized by radiograph.    ECG 12 Lead    Result Date: 02/15/2023  NORMAL SINUS RHYTHM WITH SINUS ARRHYTHMIA LEFT AXIS DEVIATION MINIMAL VOLTAGE CRITERIA FOR LVH, MAY BE NORMAL VARIANT ( Cornell product ) ANTERIOR INFARCT  (CITED ON OR BEFORE 07-Dec-2021) ABNORMAL ECG WHEN COMPARED WITH ECG OF 14-Feb-2023 13:32, SINUS RHYTHM HAS REPLACED JUNCTIONAL RHYTHM    CT Lower Extremity Left Wo Contrast    Result Date: 02/15/2023  EXAM: CT LOWER EXTREMITY LEFT WO CONTRAST DATE: 02/14/2023 8:19 PM ACCESSION: [id number] UN DICTATED: 02/14/2023 8:23 PM INTERPRETATION LOCATION: MAIN [facility]AMPUS     CLINICAL INDICATION: 82 years old Female with rule - out hip fracture s/p fall      COMPARISON: Hip radiograph 02/14/2023     TECHNIQUE: An axially-acquired helical CT scan of the left hip was obtained without administration of IV contrast. Transverse, coronal and sagittal images were reconstructed at 3-mm increments.     FINDINGS: Nondisplaced fractures of the left superior pubic rami (5:45) and left inferior pubic rami (4:163). Extensive sclerosis of the inferior sacroiliac joint may be degenerative or related to chronic sacroiliitis. No other fractures are visualized. Likely degenerative changes of the symphysis pubis. Greater trochanteric enthesopathy. Calcifications adjacent to the greater trochanter likely represent gluteal calcific tendinosis. Diffuse bone demineralization. Vascular calcifications. The remaining visualized abdomen is unremarkable. The left hip is approximated with mild degenerative changes.         Nondisplaced fractures involving the left superior and inferior pubic rami.             ++++++++++++++++++++     The findings of this study were discussed via epic messenger, receipt confirmed with DR. CARLTON REID MOORE by Dr. Lucia Estelle on 02/14/2023 8:50 PM.     -----------------------------------------------    XR Chest 2 views    Result Date: 02/14/2023  EXAM: XR CHEST 2 VIEWS ACCESSION: [id number] Bethann Humble     CLINICAL INDICATION: SHORTNESS OF BREATH      TECHNIQUE: PA and Lateral Chest Radiographs.     COMPARISON: Chest radiograph 05/21/2022     FINDINGS:     Bilateral mid and lower lung opacities. Small bilateral pleural effusions. No pneumothorax.     Stable enlarged cardiac silhouette with aortic arch calcifications..             Cardiomegaly.     Bilateral mid and lower lung opacities, likely moderate pulmonary edema. Small bilateral pleural effusions.    XR Trauma Hip Left    Result Date: 02/14/2023  EXAM: XR TRAUMA HIP LEFT DATE: 02/14/2023 2:51 PM ACCESSION: [id number] UN DICTATED: 02/14/2023 3:04 PM INTERPRETATION LOCATION: [facility]     CLINICAL INDICATION: 82 years old Female with fall    COMPARISON: None.     TECHNIQUE: AP view of the pelvis. AP and cross table lateral views of the left femur.     FINDINGS/    Small left knee joint effusion. No displaced left knee  fractures. Diffuse osteopenia noted. Diffuse soft tissue vascular calcification seen. Mild medial compartment generative change. 2. No fractures mid and distal shaft of left femur. Femoral neck and hip joint not well evaluated on the present study. On a single view, no displaced fractures identified. However, based on clinical concern, consider cross-sectional imaging for better evaluation and to exclude underlying fractures.    ECG 12 Lead    Result Date: 02/14/2023  PROBABLE JUNCTIONAL RHYTHM LEFT AXIS DEVIATION NONSPECIFIC ST ABNORMALITY MINIMAL VOLTAGE CRITERIA FOR LVH, MAY BE NORMAL VARIANT ( Cornell product ) CANNOT RULE OUT ANTEROLATERAL INFARCT  (CITED ON OR BEFORE 07-Dec-2021) WHEN COMPARED WITH ECG OF 22-May-2022 00:53, JUNCTIONAL RHYTHM IS NOW EVIDENT QRS AXIS SHIFTED LEFT QUESTIONABLE CHANGE IN INITIAL FORCES OF LATERAL LEADS Confirmed by Schuyler Amor ([id number]) on 02/14/2023 2:02:12 PM   ______________________________________________________________________  Discharge Instructions:   Activity Instructions       Activity as tolerated              Diet Instructions       Discharge diet (specify)      Discharge Nutrition Therapy: Heart Healthy            Other Instructions       Call MD for:  difficulty breathing, headache or visual disturbances      Call MD for:  persistent dizziness or light-headedness      Call MD for:  severe uncontrolled pain              Follow Up instructions and Outpatient Referrals     Ambulatory referral to Home Health      Is this a [facility] or [facility] Patient?: No    Physician to follow patient's care: Referring Provider    Disciplines requested:  Physical Therapy  Occupational Therapy       Physical Therapy requested: Evaluate and treat    Occupational Therapy Requested: Evaluate and treat    Requested SOC Date:  Comment - 48 hrs after discharge    Do you want ongoing co-management?: No    Care coordination required?: No    Call MD for:  difficulty breathing, headache or visual disturbances      Call MD for:  persistent dizziness or light-headedness      Call MD for:  severe uncontrolled pain          Appointments which have been scheduled for you      Feb 23, 2023 11:10 AM  (Arrive by 10:55 AM)  RETURN CONTINUITY with Artelia Laroche, MD  [facility]IC[facility] [place] [facility] [place]) [address]  [facility] 1 through [address]  [place] [address]  [phone]        Mar 11, 2023  1:00 PM  (Arrive by 12:45 PM)  RETURN NEPHROLOGY with Abhijit Lonia Blood, MD  [facility] [place] [facility] [place]) [address]  [facility] 1 through [address]  [place] [address]  [phone]        Mar 18, 2023  3:45 PM  (Arrive by 3:30 PM)  RETURN VIDEO MYCHART with Artelia Laroche, MD  [facility]IC[facility] [place]  [facility] REGION) [address]  [facility] 1 through 4  [place] [address]  [phone]   Please sign into My Port [facility] at least 15 minutes before your appointment to complete the eCheck-In process. You must complete eCheck-In before you can  start your video visit. We also recommend testing your audio and video connection to troubleshoot any issues before your visit begins. Click ???Join Video Visit??? to complete these checks. Once you have completed eCheck-In and tested your audio and video, click ???Join Call??? to connect to your visit.     For your video visit, you will need a computer with a working camera, speaker and microphone, a smartphone, or a tablet with internet access.    My [place] Chart enables you to manage your health, send non-urgent messages to your provider, view your test results, schedule and manage appointments, and request prescription refills securely and conveniently from your computer or mobile device.    You can go to [email or web address] to sign in to your My [place] Chart account with your username and password. If you have forgotten your username or password, please choose the ???Forgot Username???? and/or ???Forgot Password???? links to gain access. You also can access your My [place] Chart account with the free MyChart mobile app for Android or iPhone.    If you need assistance accessing your My [place] Chart account or for assistance in reaching your provider's office to reschedule or cancel your appointment, please call [facility] [phone].         Apr 09, 2023 10:30 AM  (Arrive by 10:15 AM)  RETURN HEART FAILURE [place] with Montez Hageman, AGNP  [facility] CARDIOLOGY [place] [facility] REGION) [address]  [facility] [address]  [place] [address]  [phone]   Wear comfortable walking shoes in the event that a 6-minute walk test must be performed         Apr 09, 2023 12:00 PM  (Arrive by 11:45 AM)  RETURN  PULM HYPERTENSION with Zannie Cove, MD  [facility] PULMONARY SPECIALTY CL [place] [facility] REGION) [address]  [facility] [address]  [place] [address]  [phone]             ______________________________________________________________________  Discharge Day Services:  BP 132/64  - Pulse 94  - Temp 36.2 ??C (97.1 ??F) (Temporal)  - Resp 20  - Ht 167.6 cm (5' 6)  - Wt 97.9 kg (215 lb 14.4 oz)  - SpO2 94%  - BMI 34.85 kg/m??   Pt seen on the day of discharge and determined appropriate for discharge.    Condition at Discharge: good    Length of Discharge: I spent greater than 30 mins in the discharge of this patient.

## 2023-02-18 NOTE — Unmapped (Signed)
Pt in with dyspepsia, vss, c/o lower back pain, lidocaine patch applied.  Generalized weakness, using walker and standby assistance when ambulating.  Diurectic holiday due to kidney function.  Moderate assistance needed with adls. Skin and falls protocol, will continue to work towards prioritized goals.  Daughter at bedside, verbalized understanding of POC.    Problem: Adult Inpatient Plan of Care  Goal: Plan of Care Review  Outcome: Progressing     Problem: Fall Injury Risk  Goal: Absence of Fall and Fall-Related Injury  Outcome: Progressing  Intervention: Promote Scientist, clinical (histocompatibility and immunogenetics) Documentation  Taken 02/17/2023 0800 by Bartholomew Boards, RN  Safety Interventions:   fall reduction program maintained   low bed   nonskid shoes/slippers when out of bed     Problem: Self-Care Deficit  Goal: Improved Ability to Complete Activities of Daily Living  Outcome: Progressing     Problem: Heart Failure  Goal: Optimal Coping  Outcome: Progressing     Problem: Comorbidity Management  Goal: Blood Glucose Levels Within Targeted Range  Outcome: Progressing

## 2023-02-18 NOTE — Unmapped (Signed)
VSS. Pain controlled with lidocaine patches. PIV patent. BG monitored. Plan of care ongoing. Voiding.      Problem: Adult Inpatient Plan of Care  Goal: Plan of Care Review  Outcome: Progressing  Goal: Patient-Specific Goal (Individualized)  Outcome: Progressing  Goal: Absence of Hospital-Acquired Illness or Injury  Outcome: Progressing  Intervention: Identify and Manage Fall Risk  Recent Flowsheet Documentation  Taken 02/18/2023 1400 by Odis Hollingshead, RN  Safety Interventions:   fall reduction program maintained   lighting adjusted for tasks/safety   low bed   nonskid shoes/slippers when out of bed   bed alarm  Taken 02/18/2023 1200 by Odis Hollingshead, RN  Safety Interventions:   bed alarm   fall reduction program maintained   lighting adjusted for tasks/safety   low bed   nonskid shoes/slippers when out of bed  Taken 02/18/2023 1000 by Odis Hollingshead, RN  Safety Interventions:   bed alarm   fall reduction program maintained   lighting adjusted for tasks/safety   low bed   nonskid shoes/slippers when out of bed  Taken 02/18/2023 0800 by Odis Hollingshead, RN  Safety Interventions:   bed alarm   fall reduction program maintained   lighting adjusted for tasks/safety   low bed   nonskid shoes/slippers when out of bed  Intervention: Prevent Skin Injury  Recent Flowsheet Documentation  Taken 02/18/2023 0800 by Odis Hollingshead, RN  Positioning for Skin: Supine/Back  Taken 02/18/2023 0730 by Odis Hollingshead, RN  Positioning for Skin: Supine/Back  Intervention: Prevent and Manage VTE (Venous Thromboembolism) Risk  Recent Flowsheet Documentation  Taken 02/18/2023 1400 by Odis Hollingshead, RN  Anti-Embolism Intervention: Refused  Taken 02/18/2023 1200 by Odis Hollingshead, RN  Anti-Embolism Intervention: Refused  Taken 02/18/2023 1000 by Odis Hollingshead, RN  Anti-Embolism Intervention: Refused  Taken 02/18/2023 0800 by Odis Hollingshead, RN  Anti-Embolism Intervention: Refused  Goal: Optimal Comfort and Wellbeing  Outcome: Progressing  Goal: Readiness for Transition of Care  Outcome: Progressing  Goal: Rounds/Family Conference  Outcome: Progressing     Problem: Fall Injury Risk  Goal: Absence of Fall and Fall-Related Injury  Outcome: Progressing  Intervention: Promote Scientist, clinical (histocompatibility and immunogenetics) Documentation  Taken 02/18/2023 1400 by Odis Hollingshead, RN  Safety Interventions:   fall reduction program maintained   lighting adjusted for tasks/safety   low bed   nonskid shoes/slippers when out of bed   bed alarm  Taken 02/18/2023 1200 by Odis Hollingshead, RN  Safety Interventions:   bed alarm   fall reduction program maintained   lighting adjusted for tasks/safety   low bed   nonskid shoes/slippers when out of bed  Taken 02/18/2023 1000 by Odis Hollingshead, RN  Safety Interventions:   bed alarm   fall reduction program maintained   lighting adjusted for tasks/safety   low bed   nonskid shoes/slippers when out of bed  Taken 02/18/2023 0800 by Odis Hollingshead, RN  Safety Interventions:   bed alarm   fall reduction program maintained   lighting adjusted for tasks/safety   low bed   nonskid shoes/slippers when out of bed     Problem: Self-Care Deficit  Goal: Improved Ability to Complete Activities of Daily Living  Outcome: Progressing     Problem: Heart Failure  Goal: Optimal Coping  Outcome: Progressing  Goal: Optimal Cardiac Output  Outcome: Progressing  Goal: Stable Heart Rate and Rhythm  Outcome: Progressing  Goal: Optimal Functional Ability  Outcome: Progressing  Goal: Fluid and Electrolyte Balance  Outcome: Progressing  Goal: Improved Oral  Intake  Outcome: Progressing  Goal: Effective Oxygenation and Ventilation  Outcome: Progressing  Goal: Effective Breathing Pattern During Sleep  Outcome: Progressing     Problem: Comorbidity Management  Goal: Blood Glucose Levels Within Targeted Range  Outcome: Progressing

## 2023-02-19 DIAGNOSIS — Z09 Encounter for follow-up examination after completed treatment for conditions other than malignant neoplasm: Principal | ICD-10-CM

## 2023-02-19 NOTE — Unmapped (Addendum)
Nephrology Follow-Up Consult Note    Reason for Consult: AKI     Assessment and Plan:    # AKI on [name]4   Baseline EGFR ~15-20 (cr ~2.6), follows with [facility]. [name] attributed to hypertension and diabetes. 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 Admitted with an AKI, Cr 3.5-3.6, currently stable. Normal renal US. Urine sediment evaluated 2/20 (from external foley cateter) did not show any casts or indication of ATN. Suspect that she has a hemodynamic injury, either pre-renal or volume overload (difficult volume status exam). She is warm and well compensated, and feels that she is at her baseline volume status. Had received 2 doses of IV diuretics on first 2 days of admission without change in Cr, but unclear how effective this diuresis was as intake/output was not recorded. Her weight and symptoms have been stable for past 3 days despite holding diuretics. Cr is slightly improved today. Eating and drinking well, can resume home diuretic dose.   I did discuss high risk of renal failure in the future with patient and her daughter. No dialysis indications at this time, and I discussed option of considering conservative management without dialysis in the future given her age and co-morbidities. She and her daughter remain hopeful that her renal function will remain stable as it has been for years.   Has follow up with [facility] on March 14.      #Azotemia, ?uremia  BUN is elevated, and had mild asterixis during admission, but symptoms are now improved. BUN is stable. No longer with asterixis, at baseline mental status which is excellent.     RECOMMENDATIONS:   - Can restart home torsemide to maintain her current volume status. She was advised to monitor her weights at home. Has follow up with PCP in a few days, her renal function should be evaluated at this visit.      Tonye Royalty, MD  02/18/2023     Medical decision-making for 02/18/23  Findings / Data     Patient has: []  acute illness w/systemic sxs  [mod]  []  two or more stable chronic illnesses [mod]  []  one chronic illness with acute exacerbation [mod]  []  acute complicated illness  [mod]  []  Undiagnosed new problem with uncertain prognosis  [mod] [x]  illness posing risk to life or bodily function (ex. AKI)  [high]  [x]  chronic illness with severe exacerbation/progression  [high]  []  chronic illness with severe side effects of treatment  [high] AKI on [name] Probs At least 2:  Probs, Data, Risk   I reviewed: [x]  primary team note  []  consultant note(s)  []  external records [x]  chemistry results  []  CBC results  []  blood gas results  []  Other []  procedure/op note(s)   []  radiology report(s)  []  micro result(s)  []  w/ independent historian(s) Elevated CR and BUN ?3 Data Review (2 of 3)    I independently interpreted: []  Urine Sediment  []  Renal US []  CXR Images  []  CT Images  []  Other []  EKG Tracing  Any     I discussed: []  Pathology results w/ QHPs(s) from other specialties  []  Procedural findings w/ QHPs(s) from other specialties []  Imaging w/ QHP(s) from other specialties  [x]  Treatment plan w/ QHP(s) from other specialties Plan discussed with primary team Any     Mgm't requires: [x]  Prescription drug(s)  [mod]  []  Kidney biopsy  [mod]  []  Central line placement  [mod] []  High risk medication use and/or intensive toxicity monitoring [  high]  [x]  Renal replacement therapy [high]  []  High risk kidney biopsy  [high]  []  Escalation of care  [high]  []  High risk central line placement  [high] PO diuretics, discussed risk of renal replacement therapy Risk      _____________________________________________________________________________________    Subjective/Interval Events:   Cr slightly improved. Despite being off diuretics, she feels symptoms and volume status are stable. Reports good appetite. No confusion or appetite loss. She would like to go home.     Physical Exam:   Vitals:    02/18/23 0254 02/18/23 0639 02/18/23 0657 02/18/23 0738 BP:    132/64   Pulse: 56  62 94   Resp:    20   Temp:    36.2 ??C (97.1 ??F)   TempSrc:    Temporal   SpO2:    94%   Weight:  97.9 kg (215 lb 14.4 oz)     Height:         No intake/output data recorded.    Intake/Output Summary (Last 24 hours) at 02/18/2023 2024  Last data filed at 02/18/2023 0830  Gross per 24 hour   Intake 480 ml   Output --   Net 480 ml     Constitutional: elderly pleasant, chronically ill-appearing, no acute distress  Heart: RRR, no m/r/g  Lungs: distant lung sounds, normal wob at rest on 3 L [place]  Abd: soft, non-tender, distended due to central obesity  Ext: no peripheral edema

## 2023-02-21 LAB — VITAMIN D 25 HYDROXY: VITAMIN D, TOTAL (25OH): 7.1 ng/mL — ABNORMAL LOW (ref 20.0–80.0)

## 2023-02-23 ENCOUNTER — Ambulatory Visit: Admit: 2023-02-23 | Discharge: 2023-02-24 | Payer: MEDICARE

## 2023-02-23 ENCOUNTER — Ambulatory Visit: Admit: 2023-02-23 | Discharge: 2023-02-24 | Payer: MEDICARE | Attending: Adult Health | Primary: Adult Health

## 2023-02-23 LAB — COMPREHENSIVE METABOLIC PANEL
ALBUMIN: 3.7 g/dL (ref 3.4–5.0)
ALKALINE PHOSPHATASE: 94 U/L (ref 46–116)
ALT (SGPT): <7 U/L — ABNORMAL LOW (ref 10–49)
ANION GAP: 11 mmol/L (ref 5–14)
AST (SGOT): 14 U/L (ref <=34)
BILIRUBIN TOTAL: 0.5 mg/dL (ref 0.3–1.2)
BLOOD UREA NITROGEN: 78 mg/dL — ABNORMAL HIGH (ref 9–23)
BUN / CREAT RATIO: 29
CALCIUM: 9.8 mg/dL (ref 8.7–10.4)
CHLORIDE: 104 mmol/L (ref 98–107)
CO2: 23.2 mmol/L (ref 20.0–31.0)
CREATININE: 2.68 mg/dL — ABNORMAL HIGH
EGFR CKD-EPI (2021) FEMALE: 17 mL/min/{1.73_m2} — ABNORMAL LOW (ref >=60)
GLUCOSE RANDOM: 261 mg/dL — ABNORMAL HIGH (ref 70–179)
POTASSIUM: 5.3 mmol/L — ABNORMAL HIGH (ref 3.4–4.8)
PROTEIN TOTAL: 7.8 g/dL (ref 5.7–8.2)
SODIUM: 138 mmol/L (ref 135–145)

## 2023-02-23 LAB — B-TYPE NATRIURETIC PEPTIDE: B-TYPE NATRIURETIC PEPTIDE: 601.16 pg/mL — ABNORMAL HIGH (ref <=100)

## 2023-02-23 LAB — MAGNESIUM: MAGNESIUM: 2.4 mg/dL (ref 1.6–2.6)

## 2023-02-23 MED ORDER — CYCLOBENZAPRINE 5 MG TABLET
ORAL_TABLET | Freq: Two times a day (BID) | ORAL | 0 refills | 15 days | Status: CP | PRN
Start: 2023-02-23 — End: 2023-03-25

## 2023-02-23 NOTE — Unmapped (Addendum)
Today,    MEDICATIONS:  We are changing your medications today.  - Continue torsemide 40mg  in the morning with an extra 20mg  as needed  - Message me if any problems with fluid  - Try Flexeril 5mg  up to 2x a day as needed. Make sure you have help around when you first take it in case of dizziness, etc.   - You will get a call at some point to schedule the injection for amyloidosis/neuropathy (Amvuttra). When you start it, we should start a vitamin A supplement to protect your night vision.   Call if you have questions about your medications.    LABS:  We will call you if your labs need attention.    NEXT APPOINTMENT:  Return to clinic in 6 weeks with me       In general, to take care of your heart failure:  -Limit your fluid intake to 2 Liters (half-gallon) per day.    -Limit your salt intake to ideally 2-3 grams (2000-3000 mg) per day.  -Weigh yourself daily and record, and bring that weight diary to your next appointment.  (Weight gain of 2-3 pounds in 1 day typically means fluid weight.)    The medications for your heart are to help your heart and help you live longer.    Please contact us before stopping any of your heart medications.    Call the clinic at [phone] with questions.  Our clinic fax number is [phone].  If you need to reschedule future appointments, please call [phone] or [phone]  My office number (c/o De Burrs RN) is [phone] if you need further assistance.  After office hours, if you have urgent questions/problems, contact the on-call cardiologist through the hospital operator: [phone].    Please do not send a MyChart message for potentially life-threatening symptoms.  Please call 911 for a true medical emergency.    To learn more about heart failure, please read [place]'s Learning to Live with Heart Failure - Available online at:  [email or web address] - open the window for Medical Management and click the link Living with Heart Failure   (Can search [facility] heart failure on the web to find the link.)

## 2023-02-23 NOTE — Unmapped (Unsigned)
Barbara Huber    Referring Provider: Artelia Laroche, MD  [phone] [facility] [address] [place],  [place] [address]   Primary Provider: Artelia Laroche, MD  [address]  [place] [address]   Other Providers:  Barbara Huber    Reason for Visit:  Barbara Huber is a 82 y.o. female being seen for routine visit and continued care of cardiac amyloidosis .    Assessment & Plan:  1. 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 SPEP/UPEP/free light chains without concern. 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 Daughter is informed and has seen cardiology Barbara Huber).   - for her hATTR amyloid with neuropathy leading to recent fall, have ordered Amvuttra 25mg  q85mos  - DC'd Jardiance 10mg  daily due to recurrent UTIs  - Weight at baseline today at 213.8 lb, target 213-214 at home. Continue torsemide 40mg  daily with extra 20mg  PRN.   - Continue Tafamidis 61 mg daily   - Continue spironolactone 25 mg daily   - She is not interested in CardioMEMS at this time - revisited this today.    2. Persistent atrial fibrillation  - CHA2DS2-VASc score = 5 (1-gender, 2-Age, 1-DM, 1-HTN)  - We stopped Eliquis given recurrent anemia, shared decision making w/ pt and daughter. She is not interested in [place] (or any procedures) at this time  - She had been in afib since at least 07/2020; now s/p cardioversion 01/10/21 and continues to be in NSR since  - On amiodarone 200mg  daily - increased it back herself after seeing no improvement in tremors/jerking with decreased dose  - amiodarone monitoring - last PFTs in 04/2021. TSH normal 09/2022; LFTs normal 11/2022    3. [name]  - Cr staying around 2.3-3.0; 2.68 today  - following with nephrology    4. COPD  - home O2 3L [place] - followed by pulm  - on Advair and Spiriva    5. Iron deficiency anemia  - Finished IV Iron - ferritin 178. No longer anemic, Hgb 13.2  Lab Results   Component Value Date    FERRITIN 178.0 08/19/2022     6. Anxiety  - Tolerating Zoloft 25mg  with improved moved/anxiety    Follow-up:  Return in about 6 weeks (around 04/06/2023) for already scheduled appointment.     History of Present Illness:  Barbara Huber is a 82 y.o. female with PMHx of COPD on 3L home [place], CHF, [name], [name], [name] She was initially admitted with decompensated [name] in 2021 in setting of  Septic Shock d/t Gangrenous Cholecystitis s/p cholecystectomy. She underwent further evaluation with PYP scan which showed TTR amyloid. She has since been followed in clinic for management of her [name]. In 2022, had numerous admissions for weakness/urosepsis, shortness of breath/orthopnea/weight gain, lightheadedness/SOB/Blurry vision found to be anemic.    Summary of most recent visits:  - 01/16/22 [facility]): Feeling well from a cardiac standpoint. Started Zoloft for anxiety  - 02/13/22 [facility]): Weight 210-211. Feeling well. Taking extra torsemide 20mg  about every other day, which was stopped for elevated Cr.   - 03/13/22 ([name]): Weight  201-216, took metolazone PRN, weight stabilized at 210lbs. Considering cardioMEMS.   - 05/13/22 ([name]): Had 3 iron infusions. Doing well w/ volume, weight 211-212lbs. Cooking, staying active around house.   - 05/21/22 ([name]): CP/SOB. Weight 217 from 213. She took metolazone x1 dose.   - 05/22/22 ([name]): Ed follow up. Back down to 213lbs. Still having CP w/ certain movement.   - 06/05/22 ([name]): Weight 214-216lb, should be 212lb.   - 06/09/22, 06/11/22, 06/22/22 ([name]): Weight down from 217 to 213lb, 211lb at home. Feeling well by 6/26.   - 07/15/22 ([name]): Switched inhaler back to Advair/Spiriva with improvement in breathing and O2 sat. Weight 207lbs, feeling more like herself.   - 08/19/22 ([name]): Feeling well. Weight 208-210lbs.   - 10/22/22 ([name]): Feeling well. Weight has been 210-214lbs.   - 12/04/22 ([name]): doing well overall, weight a bit up so taking extra torsemide.   - 02/10/23 ([name]): Weight 213-214lbs, feeling well, eating a lot. Had taken metolazone earlier in the week.     Interval history  Today Barbara Huber presents to the clinic for hospital follow up. She unfortunately had an admission for mechanical fall.  CT demonstrated nondisplaced fractures of the left superior and inferior pubic rami.  She was seen in consultation with orthopedic surgery. No operative intervention was recommended.  She was weightbearing as tolerated.  PT and OT were consulted and recommended SNF rehab.  Patient and family opted for her to return home with home health PT/OT and 24/7 supervision. She had some SOB with O2 sat in high 80s- low 90s on usual O2 4L [place]. She received Lasix 40 mg IV x 1 2/18 and 80 mg IV x 1 2/19, resulting in a rise in her creatinine.  Nephrology was consulted and diuretics were held with improvement in her creatinine and continued drop in her weight down to her baseline.  TTE was essentially unchanged from prior.  Her home torsemide 40 mg a.m., 20 mg p.m., was resumed at discharge.  She was continued on her home spironolactone and Tafamidis.     Since discharge, she lost more weight down to 211.6lbs. she is not taking the afternoon torsemide, just 40mg  in the morning. She has no LE edema, bloating, or SOB. She is using her normal amount of O2. Her appetite hasn't been great, doesn't feel very hungry. She is having a good amount of back pain. Trying to manage with Tylenol and oxycontin but having spasms. She is taking a laxative daily.     Cardiovascular History & Procedures:    Cath / PCI:  none    CV Surgery:   none    EP Procedures and Devices:  none    Non-Invasive Evaluation(s):    Echo:  02/17/23    1. The left ventricle is normal in size with moderately increased wall  thickness.    2. The left ventricular systolic function is hyperdynamic, LVEF is visually  estimated at >70%.    3. The left atrium is moderately dilated in size.    4. The right ventricle is normal in size, with normal systolic function.    5. There is mild pulmonary hypertension.    12/09/21  Summary    1. The left ventricle is normal in size with severely increased wall  thickness.    2. The left ventricular systolic function is hyperdynamic, LVEF is visually  estimated at >70%.    3. There is grade III diastolic dysfunction (severely elevated filling  pressure).  4. The left atrium is moderately to severely dilated in size.    5. The right ventricle is mildly dilated in size, with normal systolic  function.    6. The right atrium is mildly dilated  in size.    7. IVC size and inspiratory change suggest elevated right atrial pressure.  (10-20 mmHg).    04/24/21   1. The left ventricle is normal in size with mildly increased wall  thickness.    2. The left ventricular systolic function is normal, LVEF is visually  estimated at > 55%.    3. There is grade II diastolic dysfunction (elevated filling pressure).    4. Mitral annular calcification is present (mild).    5. The left atrium is mildly dilated in size.    6. The right ventricle is mildly dilated in size, with low normal systolic  function.    7. IVC size and inspiratory change suggest elevated right atrial pressure.  (10-20 mmHg).    09/30/20  Summary    1. Limited study to assess systolic function.    2. IVC size and inspiratory change suggest normal right atrial pressure.  (0-5 mmHg).    3. The left ventricle is normal in size with mildly to moderately increased  wall thickness.    4. The left ventricular systolic function is normal with no obvious wall  motion abnormalities, LVEF is visually estimated at > 55%.    5. Mitral annular calcification is present.    6. The left atrium is mildly dilated in size. 7. The right ventricle is upper normal in size, with reduced systolic  Function.    Cardiac CT/MRI/Nuclear Tests:   None    6 Minute Walk:  None    Cardiopulmonary Stress Tests:   None               Other Past Medical History:  See below for the complete EPIC list of past medical and surgical history.      Allergies:  Nitrofurantoin and Lipitor [atorvastatin]    Current Medications:  Current Outpatient Medications   Medication Sig Dispense Refill    acetaminophen (TYLENOL) 500 MG tablet Take 2 tablets (1,000 mg total) by mouth Three (3) times a day.      albuterol HFA 90 mcg/actuation inhaler Inhale 2 puffs every eight (8) hours as needed for wheezing.      amiodarone (PACERONE) 200 MG tablet TAKE ONE TABLET BY MOUTH ONCE DAILY 90 tablet 3    cranberry 500 mg cap Take 500 mg by mouth daily with evening meal.      estradioL (ESTRACE) 0.01 % (0.1 mg/gram) vaginal cream Insert 2 g into the vagina Two (2) times a week. 42.5 g 3    fluticasone propion-salmeteroL (ADVAIR HFA) 115-21 mcg/actuation inhaler Inhale 2 puffs Two (2) times a day. 12 g 11    fosfomycin (MONUROL) 3 gram Pack Take 3 g by mouth once a week. 36 g 3    gabapentin (NEURONTIN) 300 MG capsule Take 1 capsule (300 mg total) by mouth in the morning. 90 capsule 3    insulin glargine (BASAGLAR, LANTUS) 100 unit/mL (3 mL) injection pen Inject 0.04 mL (4 Units total) under the skin nightly.      metOLazone (ZAROXOLYN) 5 MG tablet Take 1 tablet (5 mg total) by mouth daily as needed (when instructed by cardiology clinic).      montelukast (SINGULAIR) 10 mg tablet Take 1 tablet (10 mg total) by mouth every morning. 90  tablet 2    NARCAN 4 mg/actuation nasal spray 1 spray into alternating nostrils once as needed (opioid overdose). PRN - Emergency use.      OXYCONTIN 10 mg TR12 12 hr crush resistant ER/CR tablet Take 1 tablet (10 mg total) by mouth every twelve (12) hours.      OXYGEN-AIR DELIVERY SYSTEMS MISC 5 L by Miscellaneous route. Currently using 3  L/min via [place]      pantoprazole (PROTONIX) 20 MG tablet Take 1 tablet (20 mg total) by mouth daily. 60 tablet 5    rosuvastatin (CRESTOR) 5 MG tablet Take 1 tablet (5 mg total) by mouth every other day. 15 tablet 11    spironolactone (ALDACTONE) 25 MG tablet Take 1 tablet (25 mg total) by mouth daily. 90 tablet 3    tafamidis (VYNDAMAX) 61 mg cap Take 1 capsule (61 mg) by mouth daily. 30 capsule 11    tiotropium bromide (SPIRIVA RESPIMAT) 2.5 mcg/actuation inhalation mist Inhale 2 puffs daily. 4 g 11    torsemide (DEMADEX) 20 MG tablet Take 3 tablets (60 mg total) by mouth daily. 270 tablet 3    blood sugar diagnostic (ACCU-CHEK GUIDE TEST STRIPS) Strp Use to check blood sugar daily as instructed by clinic. Dx E11.9 For daily testing 100 strip 3    blood-glucose meter kit Use as instructed Daily testing 1 each 0    cyclobenzaprine (FLEXERIL) 5 MG tablet Take 1 tablet (5 mg total) by mouth two (2) times a day as needed for muscle spasms. 30 tablet 0    lancets Misc 1 each by Miscellaneous route daily. Accu Check 100 each 6    NON FORMULARY APPLY FROM NECK DOWN TWICE A DAY      pen needle, diabetic (BD ULTRA-FINE SHORT PEN NEEDLE) 31 gauge x 5/16 (8 mm) Ndle Give 4 units of insulin each night 100 each 1     No current facility-administered medications for this visit.       Family History:  The patient's family history includes Cancer in her father; Hypertension in her mother.    Social history:  She  reports that she quit smoking about 5 years ago. Her smoking use included cigarettes. She has never used smokeless tobacco. She reports that she does not drink alcohol and does not use drugs.    Review of Systems:  As per HPI.  Rest of the review of ten systems is negative or unremarkable except as stated above.    Physical Exam:  VITAL SIGNS:   Vitals:    02/23/23 1521   BP: 118/58   Pulse: 64   SpO2: 92%       Wt Readings from Last 3 Encounters:   02/23/23 97 kg (213 lb 12.8 oz)   02/18/23 97.9 kg (215 lb 14.4 oz)   02/10/23 98 kg (216 lb)      Today's Body mass index is 34.51 kg/m??.   Height: 167.6 cm (5' 6)  CONSTITUTIONAL: well-appearing in no acute distress  EYES: Conjunctivae and sclerae clear and anicteric.  ENT: Benign.   CARDIOVASCULAR: JVP not seen above the clavicle with HOB at 90 degrees. Rate and rhythm are regular.  There is no lifts or heaves.  Normal S1, S2. There is no murmur, gallops or rubs.  Radial and pedal pulses are 2+, bilaterally.   There is no edema to ankles, bilaterally.   RESPIRATORY: Decreased breath sounds at bases.  There are no wheezes.  GASTROINTESTINAL: Soft, non-tender, with audible bowel sounds.  Abdomen nondistended.  Liver is nonpalpable.  SKIN: No rashes, ecchymosis or petechiae.  Warm, well perfused.   MUSCULOSKELETAL:  no joint swelling   NEURO/PSYCH: Appropriate mood and affect. Alert and oriented to person, place, and time. No gross motor or sensory deficits evident.    Pertinent Laboratory Studies:   No results displayed because visit has over 200 results.      Office Visit on 02/10/2023   Component Date Value Ref Range Status    BNP 02/10/2023 110.10 (H)  <=100 pg/mL Final    Sodium 02/10/2023 138  135 - 145 mmol/L Final    Potassium 02/10/2023 4.4  3.4 - 4.8 mmol/L Final    Chloride 02/10/2023 101  98 - 107 mmol/L Final    CO2 02/10/2023 28.8  20.0 - 31.0 mmol/L Final    Anion Gap 02/10/2023 8  5 - 14 mmol/L Final    BUN 02/10/2023 69 (H)  9 - 23 mg/dL Final    Creatinine 60/45/4098 2.64 (H)  0.55 - 1.02 mg/dL Final    BUN/Creatinine Ratio 02/10/2023 26   Final    eGFR [name]-EPI (2021) Female 02/10/2023 18 (L)  >=60 mL/min/1.64m2 Final    Glucose 02/10/2023 107  70 - 179 mg/dL Final    Calcium 11/91/4782 9.5  8.7 - 10.4 mg/dL Final    Magnesium 95/62/1308 2.5  1.6 - 2.6 mg/dL Final   Office Visit on 12/04/2022   Component Date Value Ref Range Status    Sodium 12/04/2022 137  135 - 145 mmol/L Final    Potassium 12/04/2022 4.1  3.4 - 4.8 mmol/L Final    Chloride 12/04/2022 100  98 - 107 mmol/L Final    CO2 12/04/2022 26.3  20.0 - 31.0 mmol/L Final    Anion Gap 12/04/2022 11  5 - 14 mmol/L Final    BUN 12/04/2022 77 (H)  9 - 23 mg/dL Final    Creatinine 65/78/4696 3.02 (H)  0.55 - 1.02 mg/dL Final    BUN/Creatinine Ratio 12/04/2022 25   Final    eGFR [name]-EPI (2021) Female 12/04/2022 15 (L)  >=60 mL/min/1.70m2 Final    Glucose 12/04/2022 238 (H)  70 - 179 mg/dL Final    Calcium 29/52/8413 9.4  8.7 - 10.4 mg/dL Final    Albumin 24/40/1027 3.6  3.4 - 5.0 g/dL Final    Total Protein 12/04/2022 7.8  5.7 - 8.2 g/dL Final    Total Bilirubin 12/04/2022 0.5  0.3 - 1.2 mg/dL Final    AST 25/36/6440 13  <=34 U/L Final    ALT 12/04/2022 <7 (L)  10 - 49 U/L Final    Alkaline Phosphatase 12/04/2022 101  46 - 116 U/L Final    PRO-BNP 12/04/2022 3,254.0 (H)  0.0 - 450.0 pg/mL Final   Appointment on 10/29/2022   Component Date Value Ref Range Status    Hemoglobin A1C 10/29/2022 5.7 (H)  4.8 - 5.6 % Final    Estimated Average Glucose 10/29/2022 117  mg/dL Final       Lab Results   Component Value Date    PRO-BNP 3,254.0 (H) 12/04/2022    PRO-BNP 3,420.0 (H) 07/03/2021    PRO-BNP 905.0 (H) 03/21/2021    Creatinine 3.19 (H) 02/18/2023    Creatinine 3.48 (H) 02/17/2023    Creatinine 0.79 01/06/2011    BUN 102 (H) 02/18/2023    BUN 109 (H) 02/17/2023    BUN 20 01/06/2011    Sodium 137 02/18/2023    Sodium 140 01/06/2011  Potassium 4.6 02/18/2023    Potassium 4.1 01/06/2011    CO2 23.6 02/18/2023    CO2 30 01/06/2011    Magnesium 2.8 (H) 02/17/2023    Magnesium 2.0 01/06/2011    Total Bilirubin 0.8 02/18/2023    INR 2.15 07/01/2021    INR 1.0 01/06/2011       Lab Results   Component Value Date    Digoxin Level 0.9 10/01/2020       Lab Results   Component Value Date    TSH 2.196 02/15/2023    Cholesterol 95 12/06/2021    Triglycerides 66 12/06/2021    HDL 42 12/06/2021    Non-HDL Cholesterol 53 (L) 12/06/2021    LDL Calculated 40 12/06/2021       Lab Results   Component Value Date    WBC 10.5 02/15/2023    WBC 12.5 (H) 01/06/2011    HGB 13.0 02/15/2023    HGB 12.9 01/06/2011    HCT 39.9 02/15/2023    HCT 40.8 01/06/2011    Platelet 143 (L) 02/15/2023    Platelet 260 01/06/2011       Pertinent Test Results from Today:  None    Other pertinent records were reviewed.    The following are further history from the patient's EPIC record for reference:     Past Medical History:   Diagnosis Date    Acute kidney injury superimposed on chronic kidney disease (CMS-HCC) 10/11/2020    Acute on chronic diastolic (congestive) heart failure (CMS-HCC) 08/23/2020    AKI (acute kidney injury) (CMS-HCC) 04/14/2015    Lab Results  Component  Value  Date     CREATININE  1.90 (H)  06/12/2021     Had a bump in her creatinine when she was taking her diuretics every day.  She is currently taking 40 mg daily of torsemide and 50 mg of spironolactone.  Her volume status is fragile.  Previously when she stopped her diuretic she becomes short of breath.  Plan: We will check her BMP today.  We will likely have to go to 40    Arthritis     Calculus of kidney     Calculus of ureter     CHF (congestive heart failure) (CMS-HCC)     Chronic atrial fibrillation (CMS-HCC) 07/20/2019    COPD (chronic obstructive pulmonary disease) (CMS-HCC)     Diabetes (CMS-HCC)     Gangrenous cholecystitis 10/11/2020    Generalized edema  06/17/2021    GERD (gastroesophageal reflux disease)     Hydronephrosis     Hypertension     Hyponatremia 10/11/2020    Intermediate coronary syndrome (CMS-HCC) 03/13/2014    Lower extremity edema 09/28/2020    Lumbar stenosis     Microscopic hematuria     Nausea alone     Nephrolithiasis 04/17/2016    Neuropathy     Nocturia     Nocturia 07/01/2017    Other chronic cystitis     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  See plans for anemia, AKI  We are also tapering her gabapentin.  Currently on 300 mg 3 times daily.  We will decrease it to twice daily, and then nightly.  She states she is not having any recurrence of her pain.    Pulmonary hypertension (CMS-HCC)     Renal colic     Shortness of breath 04/28/2020    Sleep apnea     Unstable angina pectoris (CMS-HCC) 03/13/2014  Past Surgical History:   Procedure Laterality Date    BACK SURGERY  1995    CARPAL TUNNEL RELEASE Left 2014    HYSTERECTOMY  1971    IR INSERT CHOLECYSTOSMY TUBE PERCUTANEOUS  10/02/2020    IR INSERT CHOLECYSTOSMY TUBE PERCUTANEOUS 10/02/2020 Braulio Conte, MD IMG VIR H&V [facility]    LUMBAR DISC SURGERY      PR REMOVAL GALLBLADDER N/A 10/06/2020    Procedure: CHOLECYSTECTOMY;  Surgeon: Katherina Mires, MD;  Location: MAIN OR [facility];  Service: Trauma    PR RIGHT HEART CATH O2 SATURATION & CARDIAC OUTPUT N/A 09/30/2020    Procedure: Right Heart Catheterization;  Surgeon: Neal Dy, MD;  Location: [facility] CATH;  Service: Cardiology    PR RIGHT HEART CATH O2 SATURATION & CARDIAC OUTPUT N/A 01/09/2021    Procedure: Right Heart Catheterization;  Surgeon: Lesle Reek, MD;  Location: [facility] CATH;  Service: Cardiology

## 2023-02-23 NOTE — Unmapped (Signed)
ORTHOPAEDIC NOTE     Barbara Huber L. Barbara Hansen, PA-C        Barbara Huber    MRN: [id number]  DOB: 04/27/1941    Date of visit: 02/23/2023    Clinic location: [place]     ASSESSMENT:     Stable left superior and inferior pubic rami fracture sustained on 02/15/2023     PLAN:     -Patient understands that the current fracture appears stable and is amenable to non operative treatment if there is no change in alignment  -Patient understands that this generally takes ~12 weeks to heal and ~6 months for pain and swelling to resolve  She may weight-bear as tolerated as an assistive device working with physical therapy  -Advised OTC analgesic PRN pain  -Discussed treatment options and patient was amenable to the above plan and was instructed to call and be seen if there is any increasing pain or concerns.     Follow up: 6 weeks, AP, inlet, outlet views of the pelvis       Chief Complaint:     Pelvis injury     SUBJECTIVE:     HPI: Barbara Huber is a  82 y.o. 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  She states that she has some mild pain in her groin but most of her pain is in her lumbar spine.  Denies numbness, tingling, weakness to the legs.  She has been working with therapy and attempting to bear weight with a walker.       Allergies  Allergies   Allergen Reactions    Nitrofurantoin Anaphylaxis and Other (See Comments)    Lipitor [Atorvastatin] Muscle Pain     SOB, Headache, fatigue, sick on my stomach     Past Medical History  Past Medical History:   Diagnosis Date    Acute kidney injury superimposed on chronic kidney disease (CMS-HCC) 10/11/2020    Acute on chronic diastolic (congestive) heart failure (CMS-HCC) 08/23/2020    AKI (acute kidney injury) (CMS-HCC) 04/14/2015    Lab Results  Component  Value  Date     CREATININE  1.90 (H)  06/12/2021     Had a bump in her creatinine when she was taking her diuretics every day.  She is currently taking 40 mg daily of torsemide and 50 mg of spironolactone.  Her volume status is fragile.  Previously when she stopped her diuretic she becomes short of breath.  Plan: We will check her BMP today.  We will likely have to go to 40    Arthritis     Calculus of kidney     Calculus of ureter     CHF (congestive heart failure) (CMS-HCC)     Chronic atrial fibrillation (CMS-HCC) 07/20/2019    COPD (chronic obstructive pulmonary disease) (CMS-HCC)     Diabetes (CMS-HCC)     Gangrenous cholecystitis 10/11/2020    Generalized edema  06/17/2021    GERD (gastroesophageal reflux disease)     Hydronephrosis     Hypertension     Hyponatremia 10/11/2020    Intermediate coronary syndrome (CMS-HCC) 03/13/2014    Lower extremity edema 09/28/2020    Lumbar stenosis     Microscopic hematuria     Nausea alone     Nephrolithiasis 04/17/2016    Neuropathy  Nocturia     Nocturia 07/01/2017    Other chronic cystitis     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  See plans for anemia, AKI  We are also tapering her gabapentin.  Currently on 300 mg 3 times daily.  We will decrease it to twice daily, and then nightly.  She states she is not having any recurrence of her pain.    Pulmonary hypertension (CMS-HCC)     Renal colic     Shortness of breath 04/28/2020    Sleep apnea     Unstable angina pectoris (CMS-HCC) 03/13/2014        PHYSICAL EXAM:     MSK: Pelvis and bilateral lower extremities  Inspection: No edema, no erythema, skin intact  Palpation: Mild tenderness along the pubic rami on the left as well as lumbar spine  ROM: Mild stiffness with internal/external rotation of the left hip  Strength: Mild weakness symmetrically bilateral lower extremities  Negative straight leg raise bilaterally  normal sensation RLE LLE  Dorsalis pedal pulses easily palpable      Imaging   Three views of the pelvis AP, Inlet, Outlet independently reviewed and interpreted by myself show nondisplaced left superior and inferior pubic rami fractures with sacroiliac and incompletely visualized lumbar degenerative changes. No other obvious fractures, lucencies, dislocations, or acute abnormalities.    MEDICAL DECISION MAKING (level of service defined by 2/3 elements)     Number/Complexity of Problems Addressed 1 acute, uncomplicated illness or injury (99203/99213)   Amount/Complexity of Data to be Reviewed/Analyzed Independent interpretation of a test performed by another physician/other qualified health care professional (99204/99214)   Risk of Complications/Morbidity/Mortality of Management Physical Therapy/Occupational Therapy (99203/99213)   DME ORDER:  Dx:  ,                   cc:  Artelia Laroche, MD  *Patient note was created using Dragon Dictation sotware. Errors in syntax or grammar may not have been identified and edited on initial review.

## 2023-03-02 MED ORDER — SERTRALINE 25 MG TABLET
ORAL_TABLET | Freq: Every day | ORAL | 3 refills | 90 days
Start: 2023-03-02 — End: ?

## 2023-03-02 NOTE — Unmapped (Signed)
Refill request received for patient.      Medication Requested: sertraline  Last Office Visit: 02/23/2023   Next Office Visit: 04/14/2023  Last Prescriber: Bea Laura baker    Nurse refill requirements met? No  If not met, why: The original prescription was discontinued on 02/16/2023 by Winferd Humphrey, PharmD for the following reason: Therapy completed. Renewing this prescription may not be appropriate.     Sent to: Provider for signing  If sent to provider, which provider?: e baker

## 2023-03-03 NOTE — Unmapped (Signed)
[facility] Refill Coordination Note    Specialty Medication(s) to be Shipped:   General Specialty: Vyndamax    Other medication(s) to be shipped: No additional medications requested for fill at this time     Barbara Huber, DOB: Jun 22, 1941  Phone: [phone] (home)       All above HIPAA information was verified with patient.     Was a Nurse, learning disability used for this call? No    Completed refill call assessment today to schedule patient's medication shipment from the [facility] [phone]).  All relevant notes have been reviewed.     Specialty medication(s) and dose(s) confirmed: Regimen is correct and unchanged.   Changes to medications: Toriann reports no changes at this time.  Changes to insurance: No  New side effects reported not previously addressed with a pharmacist or physician: None reported  Questions for the pharmacist: No    Confirmed patient received a Conservation officer, historic buildings and a Surveyor, mining with first shipment. The patient will receive a drug information handout for each medication shipped and additional FDA Medication Guides as required.       DISEASE/MEDICATION-SPECIFIC INFORMATION        N/A    SPECIALTY MEDICATION ADHERENCE     Medication Adherence    Patient reported X missed doses in the last month: 0  Specialty Medication: Vyndamax 61mg   Informant: patient              Were doses missed due to medication being on hold? No    Vyndamax 61 mg: 10 days of medicine on hand       REFERRAL TO PHARMACIST     Referral to the pharmacist: Not needed      [facility]     Shipping address confirmed in Epic.     Patient was notified of new phone menu : No    Delivery Scheduled: Yes, Expected medication delivery date: 03/09/23.     Medication will be delivered via Next Day Courier to the prescription address in Epic WAM.    Camillo Flaming, PharmD   [facility] Specialty Pharmacist

## 2023-03-08 MED FILL — VYNDAMAX 61 MG CAPSULE: ORAL | 30 days supply | Qty: 30 | Fill #2

## 2023-03-17 ENCOUNTER — Ambulatory Visit: Admit: 2023-03-17 | Discharge: 2023-03-18 | Payer: MEDICARE

## 2023-03-17 NOTE — Unmapped (Signed)
Thank you for choosing [facility]!  We appreciate the opportunity to participate in your care. Please let us know if we can be of assistance your orthopaedic issues in the future.     MyChart messages: These messages can be sent to your provider and will be checked by their clinical support staff.? The messages are checked throughout the day during normal business hours from 8:30 am-4:00 pm Monday-Friday, however responses may take up to 48 hours.? Please use this method of communication for non-urgent and non-emergent concerns, questions, refill requests or inquiries only.? ?Our team will help respond to all of your questions.? Please note that you may be asked to see a provider by either a telehealth or in person visit if it is deemed your questions are best handled in the clinic setting in person.??  ?  Please keep in mind, these messages are not real time communications, so be patient when waiting for a response.    If you do not have access to MyChart, do not know how to use MyChart or have an issue that may require more extensive discussion, please call the nurses' call line: [phone].? This line is checked throughout the day and will be responded to as time allows.? Please note that return calls could take up to 48 hours, depending on the nature of the need.?  ?  If you have an issue that requires emergent attention that cannot wait; either call the Orthopaedics resident on call at [phone], consider coming to our [facility] walk-in clinic, or go to the nearest Emergency Department.    We look forward to seeing you again in the future and appreciate you choosing [facility] for your care!    If you have questions or concerns, please do not hesitate to contact us by [facility] or by calling [phone] to speak with one of our clinical support sports team members.     [facility] MyChart Website: [email or web address]      Appointment Scheduling: [phone]    Walk-in hours:        [facility]:  Monday - Friday 8 am - 4 pm  [address]  [address] Floor, [address]  [place], [place]  [address]

## 2023-03-18 ENCOUNTER — Telehealth: Admit: 2023-03-18 | Discharge: 2023-03-19 | Payer: MEDICARE

## 2023-03-18 DIAGNOSIS — Z794 Long term (current) use of insulin: Principal | ICD-10-CM

## 2023-03-18 DIAGNOSIS — M8000XD Age-related osteoporosis with current pathological fracture, unspecified site, subsequent encounter for fracture with routine healing: Principal | ICD-10-CM

## 2023-03-18 DIAGNOSIS — S32602D Unspecified fracture of left ischium, subsequent encounter for fracture with routine healing: Principal | ICD-10-CM

## 2023-03-18 DIAGNOSIS — E1122 Type 2 diabetes mellitus with diabetic chronic kidney disease: Principal | ICD-10-CM

## 2023-03-18 DIAGNOSIS — I5032 Chronic diastolic (congestive) heart failure: Principal | ICD-10-CM

## 2023-03-18 DIAGNOSIS — N184 Chronic kidney disease, stage 4 (severe): Principal | ICD-10-CM

## 2023-03-18 DIAGNOSIS — Z780 Asymptomatic menopausal state: Principal | ICD-10-CM

## 2023-03-18 MED ORDER — ERGOCALCIFEROL (VITAMIN D2) 1,250 MCG (50,000 UNIT) CAPSULE
ORAL_CAPSULE | ORAL | 3 refills | 84 days | Status: CP
Start: 2023-03-18 — End: 2024-03-17

## 2023-03-18 MED ORDER — CYCLOBENZAPRINE 5 MG TABLET
ORAL_TABLET | Freq: Two times a day (BID) | ORAL | 3 refills | 15 days | Status: CP | PRN
Start: 2023-03-18 — End: ?

## 2023-03-18 NOTE — Unmapped (Signed)
[facility]     Type of visit: video    Are you located in [place]? (for virtual visits only)     Reason for visit: Follow up    Questions / Concerns that need to be addressed: none         PTHomeBP     Diabetes:  Regularly checking blood sugars?: yes  If yes, when? Complete log for past 7 days  Date Before Breakfast After Breakfast Before Lunch After Lunch Before Dinner After Dinner Before Bed    09/18/2023  106                                                                                                                             HCDM reviewed and updated in Epic:    We are working to make sure all of our patients??? wishes are updated in Epic and part of that is documenting a Environmental health practitioner for each patient  A Health Care Decision Maker is someone you choose who can make health care decisions for you if you are not able - who would you most want to do this for you????  is already up to date.    HCDM (patient stated preference): [name],[name] - Daughter - [phone]    BPAs completed:      COVID-19 Vaccine Summary  Which COVID-19 Vaccine was administered  Pfizer  Type:  Dates Given:                   If no: Are you interested in scheduling?   Immunization History   Administered Date(s) Administered    COVID-19 VAC,BIVALENT(37YR UP),PFIZER 11/14/2021    COVID-19 VACC,MRNA,(PFIZER)(PF) 01/19/2020, 02/09/2020, 12/12/2020    INFLUENZA QUAD ADJUVANTED 17YR UP(FLUAD) 09/17/2021    INFLUENZA QUAD HIGH DOSE 17YRS+(FLUZONE) 09/28/2016    Influenza Vaccine Quad(IM)6 MO-Adult(PF) 10/16/2015, 09/08/2019, 08/30/2020    Influenza Virus Vaccine, unspecified formulation 09/27/2014, 09/23/2017, 09/21/2018    PNEUMOCOCCAL POLYSACCHARIDE 23-VALENT 09/28/2016    Pneumococcal Conjugate 13-Valent 08/22/2019    SHINGRIX-ZOSTER VACCINE (HZV),RECOMBINANT,ADJUVANTED(IM) 04/04/2021, 09/17/2021 __________________________________________________________________________________________    SCREENINGS COMPLETED IN FLOWSHEETS    HARK Screening       AUDIT  AUDIT - C Score (Part 1): 0    PHQ2  PHQ-2 Total Score : 0    PHQ9          P4 Suicidality Screener                GAD7       COPD Assessment       Falls Risk  Falls Risk  Have you fallen in the past year?: (!) Yes  Do you feel unsteady when standing or walking?: (!) Yes (pt stated fell in February 2024)

## 2023-03-18 NOTE — Unmapped (Signed)
Internal Medicine Clinic Visit  Person Contacted: Patient  Contact Phone number: [phone] (home)   Is there someone else in the room? Yes. What is your relationship? daughter. Do you want this person here for the visit? yes.  Reason for visit: follow up    A/P:    Osteoporosis with current pathological fracture with routine healing, unspecified osteoporosis type, subsequent encounter  Overview:  01/2023 pelvic fx, non displaced from standing  Xray-osteopenia  Imp: osteoporosis  Plan: replete vit d-50,000 units weekly  Order dexa  Endo consult    Lab Results   Component Value Date    VITDTOTAL 7.1 (L) 02/16/2023       Closed nondisplaced fracture of left ischium with routine healing, unspecified fracture morphology, subsequent encounter  Getting home PT and saw ortho yesterday  Using walker  Plan; CTM  Type 2 diabetes mellitus with stage 4 chronic kidney disease, with long-term current use of insulin (CMS-HCC)  Overview:  Lab Results   Component Value Date    A1C 5.7 (H) 10/29/2022    A1C 5.6 03/13/2022    A1C 7.0 (H) 12/06/2021     Diabetes, on lantus 4 unit qhs at bedtime. Due to low GFR, off other meds. Did not tolerate jardiance, due to UTI  BS s at home in range, we will watch for hypoglycemia  Plan: cont. Same dose  Check A1C with next in person visit, or lab visit    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)  On amiodarone 200 mg every day, torsemide 40 mg q am and 20 mg q pm, tafamidis for TTR amyloid, spirinolactone 25 mg  Has iron infusions as needed  Euvolemic by report  Plan:   Cont current meds  Follows up with [facility] cardiology     Asymptomatic menopausal state  Check bone density, as above  Chronic kidney disease ([name]), stage IV (severe) (CMS-HCC)  Overview:  Lab Results   Component Value Date    CREATININE 2.68 (H) 02/23/2023     Creatinine stable  Anticipate maintaining current dose of torsemide 40 mg every a.m., 20 mg q pm. Also on spirinolactone.     Follows with [facility] nephrology    Return in about 3 months (around 06/18/2023).    HPI:   Pt is a 82 y.o. 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     Bone density:   Larey Seat and broke her  hip, Feb 17. Fell in the kitchen.   Still with pain.   Had junctional rhythm.   On 3 liters of oxygen.   Has no leg swelling  BS: 98-100/ on 4 units of lantus.   BP: 119/70.   No diarrhea, no constipation.   Getting PT at the house.   Taking 40 mg of demadex in the morning, 20 mg in evening    Lab Results   Component Value Date    A1C 5.7 (H) 10/29/2022         Lab Results   Component Value Date    VITDTOTAL 7.1 (L) 02/16/2023       Problem List:  Patient Active Problem List   Diagnosis    Spinal stenosis of lumbar region    Thoracic or lumbosacral neuritis or radiculitis    Lumbosacral spondylosis    COPD (chronic obstructive pulmonary disease) (CMS-HCC)    Sleep apnea in adult    Essential hypertension    Type 2 diabetes  mellitus with stage 4 chronic kidney disease, with long-term current use of insulin (CMS-HCC)    Chronic cystitis    Incomplete bladder emptying    Peripheral neuropathy    Chronic respiratory failure with hypoxia (CMS-HCC)    Anemia    Persistent atrial fibrillation (CMS-HCC)    Anemia in chronic kidney disease    Chronic kidney disease ([name]), stage IV (severe) (CMS-HCC)    Pulmonary hypertension (CMS-HCC)    Chronic prescription opiate use    Dyspnea on exertion    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)    Cardiac amyloidosis (CMS-HCC)    Recurrent cold sores    Dyspepsia    Iron deficiency anemia due to chronic blood loss    Neuropathy    Left hip pain    Familial amyloid heart disease (CMS-HCC)    Hereditary amyloidosis (CMS-HCC)    Osteoporosis with current pathological fracture       Medications:  Reviewed in EPIC    Social history:       Physical Exam:   Vital Signs:  BP Readings from Last 3 Encounters:   02/23/23 118/58   02/18/23 132/64   02/10/23 113/60      Wt Readings from Last 3 Encounters:   03/18/23 96.6 kg (213 lb)   02/23/23 97 kg (213 lb 12.8 oz)   02/18/23 97.9 kg (215 lb 14.4 oz)      General appearance -   Looks well, with oxygen via [place]    Records review  Lab Results   Component Value Date    A1C 5.7 (H) 10/29/2022        Health Maintenance Due   Topic Date Due    DEXA Scan  Never done    Retinal Eye Exam  Never done    DTaP/Tdap/Td Vaccines (1 - Tdap) Never done    Medicare Annual Wellness Visit (AWV)  11/25/2018    Foot Exam  02/04/2022    COVID-19 Vaccine (5 - 2023-24 season) 08/28/2022      The ASCVD Risk score (Arnett DK, et al., 2019) failed to calculate.     Medication adherence and barriers to the treatment plan have been addressed. Opportunities to optimize healthy behaviors have been discussed. Patient / caregiver voiced understanding.      The patient reports they are physically located in [place] and is currently: at home. I conducted a audio/video visit. I spent  85m 43s on the video call with the patient. I spent an additional 14 minutes on pre- and post-visit activities on the date of service .

## 2023-03-18 NOTE — Unmapped (Signed)
[facility] Visit                                   Assessment: Barbara Huber is a 82 y.o. female with stable left superior and inferior pubic rami fractures after injury on 02/15/2023    Imaging: no new imaging    Plan:  - I reviewed imaging and discussed the nature and mechanics of the diagnoses with patient. Treatment options discussed in detail.  Reiterated with patient and her daughter that her fracture is still healing and while it may take 12 weeks for the bone to heal she may continue to have some pain for up to 6 months.  Symptoms today are not concerning for low back pain or lumbar radiculopathy as most of her symptoms are localized to the gluteal notch.  May continue with plan of care as prescribed including physical therapy.    - Continue ice/heat, compression, elevation, OTC pain medication, activity modification as needed.     - Patient states understanding and agrees with above plan and was instructed to call for an appointment or go to the emergency department if there is any increase in pain or concerns. All questions were answered.     Follow up: Return for Follow up with [facility] as scheduled.    History of present illness:  Barbara Huber is a 82 y.o. female with a known stable left superior and inferior pubic rami fracture sustained on 02/15/2023 presenting to [facility] clinic with her daughter for evaluation of left hip pain.  She was recently seen by Arlester Marker on 02/23/2023 and was to continue weightbearing as tolerated with use of assistive device as needed.  Today patient reports continued pain in the area of the left lower back and buttock.  Denies new injury or falls since last visit.  Denies radiating pain or numbness and tingling into the left lower extremity.  States that pain is random and intermittent and causes sharp increase in pain that she describes as a catch.    Review of Systems  .   Marland Kitchen   Medical History Past Medical History:   Diagnosis Date    Acute kidney injury superimposed on chronic kidney disease (CMS-HCC) 10/11/2020    Acute on chronic diastolic (congestive) heart failure (CMS-HCC) 08/23/2020    AKI (acute kidney injury) (CMS-HCC) 04/14/2015    Lab Results  Component  Value  Date     CREATININE  1.90 (H)  06/12/2021     Had a bump in her creatinine when she was taking her diuretics every day.  She is currently taking 40 mg daily of torsemide and 50 mg of spironolactone.  Her volume status is fragile.  Previously when she stopped her diuretic she becomes short of breath.  Plan: We will check her BMP today.  We will likely have to go to 40    Arthritis     Calculus of kidney     Calculus of ureter     CHF (congestive heart failure) (CMS-HCC)     Chronic atrial fibrillation (CMS-HCC) 07/20/2019    COPD (chronic obstructive pulmonary disease) (CMS-HCC)     Diabetes (CMS-HCC)     Gangrenous cholecystitis 10/11/2020    Generalized edema  06/17/2021    GERD (gastroesophageal reflux disease)     Hydronephrosis     Hypertension     Hyponatremia 10/11/2020    Intermediate coronary syndrome (CMS-HCC) 03/13/2014  Lower extremity edema 09/28/2020    Lumbar stenosis     Microscopic hematuria     Nausea alone     Nephrolithiasis 04/17/2016    Neuropathy     Nocturia     Nocturia 07/01/2017    Other chronic cystitis     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  See plans for anemia, AKI  We are also tapering her gabapentin.  Currently on 300 mg 3 times daily.  We will decrease it to twice daily, and then nightly.  She states she is not having any recurrence of her pain.    Pulmonary hypertension (CMS-HCC)     Renal colic     Shortness of breath 04/28/2020    Sleep apnea     Unstable angina pectoris (CMS-HCC) 03/13/2014      Surgical History Past Surgical History:   Procedure Laterality Date    BACK SURGERY  1995    CARPAL TUNNEL RELEASE Left 2014    HYSTERECTOMY  1971    IR INSERT CHOLECYSTOSMY TUBE PERCUTANEOUS  10/02/2020    IR INSERT CHOLECYSTOSMY TUBE PERCUTANEOUS 10/02/2020 Braulio Conte, MD IMG VIR H&V [facility]    LUMBAR DISC SURGERY      PR REMOVAL GALLBLADDER N/A 10/06/2020    Procedure: CHOLECYSTECTOMY;  Surgeon: Katherina Mires, MD;  Location: MAIN [facility]  Service: Trauma    PR RIGHT HEART CATH O2 SATURATION & CARDIAC OUTPUT N/A 09/30/2020    Procedure: Right Heart Catheterization;  Surgeon: Neal Dy, MD;  Location: [facility] CATH;  Service: Cardiology    PR RIGHT HEART CATH O2 SATURATION & CARDIAC OUTPUT N/A 01/09/2021    Procedure: Right Heart Catheterization;  Surgeon: Lesle Reek, MD;  Location: [facility] CATH;  Service: Cardiology      Allergies Nitrofurantoin and Lipitor [atorvastatin]   Medications She has a current medication list which includes the following prescription(s): acetaminophen, albuterol, amiodarone, accu-chek guide test strips, blood-glucose meter, cranberry, cyclobenzaprine, estradiol, fluticasone propion-salmeterol, gabapentin, insulin glargine, lancets, metolazone, montelukast, narcan, NON FORMULARY, oxycontin, oxygen-air delivery systems, pantoprazole, pen needle, diabetic, rosuvastatin, spironolactone, vyndamax, spiriva respimat, and torsemide.   Family History {Her family history includes Cancer in her father; Hypertension in her mother; No Known Problems in her brother, brother, maternal aunt, maternal grandfather, maternal grandmother, maternal uncle, paternal aunt, paternal grandfather, paternal grandmother, paternal uncle, sister, sister, and another family member.   Social History She reports that she quit smoking about 5 years ago. Her smoking use included cigarettes. She has never used smokeless tobacco. She reports that she does not drink alcohol and does not use drugs.Home address:[address]  [place] [address]  Occupation:         Occupational History    Occupation: retired     Social History     Social History Narrative    Previously worked in Designer, fashion/clothing, retired.  Daughter is Economist who works at [employer].  Her niece is Selena Batten, [employer] Anesthesia Tech.  Lives in [place] with daughter and son-in-law.            Physical examination: General: Well developed, well nourished pleasant female in no acute distress who is A&O x 3. There is no height or weight on file to calculate BMI.    Head: Normocephalic, atraumatic  Chest: Normal chest excursion with respiration  Respiratory: Normal respiratory effort. No respiratory distress  Skin: no rashes or abrasions or lesions  Pelvis and bilateral lower extremities  Inspection: No edema, no erythema, skin intact  Palpation: Mild tenderness along the pubic rami on the left as well as lumbar spine  ROM: Mild stiffness with internal/external rotation of the left hip  Strength: Mild weakness symmetrically bilateral lower extremities  Negative straight leg raise bilaterally  normal sensation RLE LLE  Dorsalis pedal pulses easily palpable      Patient note was created using Scientist, clinical (histocompatibility and immunogenetics). Errors in syntax or grammar may not have been identified and edited on initial review.       MEDICAL DECISION MAKING (level of service defined by 2/3 elements)     Number/Complexity of Problems Addressed 1 stable chronic illness (99203/99213)   Amount/Complexity of Data to be Reviewed/Analyzed 2 points: Review prior notes (1 point per unique source); Review test results (1 point per unique test); Order tests (1 point per unique test) (99203/99213)   Risk of Complications/Morbidity/Mortality of Management Over-the-counter Medications (99203/99213)

## 2023-03-19 NOTE — Unmapped (Signed)
Addended by: Jacquiline Doe on: 03/18/2023 05:31 PM     Modules accepted: Level of Service

## 2023-03-24 DIAGNOSIS — R1013 Epigastric pain: Principal | ICD-10-CM

## 2023-03-24 MED ORDER — PANTOPRAZOLE 20 MG TABLET,DELAYED RELEASE
ORAL_TABLET | Freq: Every day | ORAL | 3 refills | 90.00000 days | Status: CP
Start: 2023-03-24 — End: 2023-03-24

## 2023-03-24 MED ORDER — SPIRONOLACTONE 25 MG TABLET
ORAL_TABLET | Freq: Every day | ORAL | 3 refills | 90 days | Status: CP
Start: 2023-03-24 — End: ?

## 2023-03-24 NOTE — Unmapped (Signed)
Refill request received for patient.      Medication Requested: spironolactone  Last Office Visit: 02/23/2023   Next Office Visit: 04/14/2023  Last Prescriber: Derwood Kaplan    Nurse refill requirements met? Yes  If not met, why: n/a    Sent to: Provider for signing  If sent to provider, which provider?: Derwood Kaplan

## 2023-03-26 NOTE — Unmapped (Signed)
Barbara Huber is/will be scheduled with Nephrologist Dr. Elzie Rings Kshirsagar on 05/06/23 and has been identified as a patient possibly eligible for initiation of an SGLT2-inhibitor and part of a high-priority group by the SGLT2i improvement project team.  When you see this patient in this clinic visit, please use the SmartPhrase SGLT2CONSULT in your clinic note to assess appropriateness for initiation of SGLT2 inhibitor.  For any clinical questions or concerns, consult Dr. Casimer Bilis via staff message.     For additional reference materials (login with Onyen):   - Go to [email or web address] and login with your Onyen  - Look for  SGT2i infographic and SGLT2i Medication Access

## 2023-03-31 NOTE — Unmapped (Signed)
[facility] Refill Coordination Note    Specialty Medication(s) to be Shipped:   General Specialty: Vyndamax    Other medication(s) to be shipped: No additional medications requested for fill at this time     Barbara Huber, DOB: Oct 31, 1941  Phone: [phone] (home)       All above HIPAA information was verified with patient.     Was a Nurse, learning disability used for this call? No    Completed refill call assessment today to schedule patient's medication shipment from the [facility] [phone]).  All relevant notes have been reviewed.     Specialty medication(s) and dose(s) confirmed: Regimen is correct and unchanged.   Changes to medications: [name] reports no changes at this time.  Changes to insurance: No  New side effects reported not previously addressed with a pharmacist or physician: None reported  Questions for the pharmacist: No    Confirmed patient received a Conservation officer, historic buildings and a Surveyor, mining with first shipment. The patient will receive a drug information handout for each medication shipped and additional FDA Medication Guides as required.       DISEASE/MEDICATION-SPECIFIC INFORMATION        N/A    SPECIALTY MEDICATION ADHERENCE     Medication Adherence    Patient reported X missed doses in the last month: 0  Specialty Medication: Vyndamax 61mg   Informant: patient              Were doses missed due to medication being on hold? No    Vyndamax 61 mg: ~10 days of medicine on hand        REFERRAL TO PHARMACIST     Referral to the pharmacist: Not needed      [facility]     Shipping address confirmed in Epic.     Patient was notified of new phone menu : No    Delivery Scheduled: Yes, Expected medication delivery date: 04/07/23.     Medication will be delivered via Next Day Courier to the prescription address in Epic WAM.    Camillo Flaming, PharmD   [facility] Specialty Pharmacist

## 2023-04-07 MED FILL — VYNDAMAX 61 MG CAPSULE: ORAL | 30 days supply | Qty: 30 | Fill #3

## 2023-04-09 ENCOUNTER — Ambulatory Visit: Admit: 2023-04-09 | Discharge: 2023-04-10 | Payer: MEDICARE | Attending: Adult Health | Primary: Adult Health

## 2023-04-09 ENCOUNTER — Ambulatory Visit: Admit: 2023-04-09 | Discharge: 2023-04-10 | Payer: MEDICARE

## 2023-04-09 DIAGNOSIS — I272 Pulmonary hypertension, unspecified: Principal | ICD-10-CM

## 2023-04-09 DIAGNOSIS — J449 Chronic obstructive pulmonary disease, unspecified: Principal | ICD-10-CM

## 2023-04-09 DIAGNOSIS — J9611 Chronic respiratory failure with hypoxia: Principal | ICD-10-CM

## 2023-04-09 NOTE — Unmapped (Signed)
Thank you for visiting the [facility].    If you have any questions or problems between visits, please call:    [phone]    After hours, please call the Hospital Operator at [phone] and ask for the pulmonary fellow on call.     To schedule any CT scans or other radiologic images please call [phone].    Chest X-rays can be done without an appointment at the [facility] at [address], [place], [place] [address].

## 2023-04-09 NOTE — Unmapped (Signed)
[facility]  Pulmonary Hypertension Program                        H. Sherre Lain, MD--Director (Pulmonary)  Barbara Huber. Barbara Res, MD--Associate Director (Pulmonary)  Freeman Caldron, MD (Cardiology)  Bonney Leitz, MD (Pulmonary)    [name], RN Nurse Coordinator  Claretha Cooper, RN Nurse Coordinator      [phone] office  [phone] fax            [name]  is a 82 y.o. female patient referred for work up and evaluation for pulmonary hypertension, COPD, and chronic hypoxemic respiratory failure.    Assessment:      Deniesha Younis is a 82 y.o.female with COPD and chronic hypoxemic respiratory failure. She was previously referred to me for pulmonary hypertension, though this is now clearly dominant left heart/group 2 pathology (with small precapillary component, either secondary to [name] versus COPD).     Overall doing well from a cardiopulmonary standpoint since last visit, COPD has been very well controlled lately, with no exacerbations. Continues to experience some fatigue and dyspnea, though stable, and more likely related to cardiac pathology than truly COPD. Overall she has benefited from current inhaler plan (non-DPI focused), as well as daytime oxygen/nocturnal NIPPV, and heart failure care. With lack of exacerbations recently, we trialed change to LABA/LAMA with Stiolto, but did not tolerate this, changed back to Spiriva/Advair.       Plan:         - Continue Advair HFA and Spiriva Respimat. Did not do well with previous trial of LABA/LAMA only (side effects with Stiolto), so will leave unchanged for now.   - Continue oxygen at 3LPM including at night, order faxed to [employer] for humidifier. Continue Trilogy/O2 at night. She is benefiting from AVAPS settings.   - Discussed interest in formal PR at any point in the future, can continue to discuss in future if patient interested.  - Close followup with heart failure team.  - Next spirometry likely spring 2025. Followup in 6 months.       Barbara Byars, MD  04/09/23  Subjective:        82 y.o. 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 Meredeth Ide. Recalls COPD diagnosis dating back more than 10 years ago, no history of asthma or other lung conditions. Overall it sounds like she has been maintained on triple inhaler therapy (Advair diskus, Spiriva handihaler), with exacerbations averaging maybe once/year, usually not requiring hospitalization. Albuterol PRN via MDI (tries to use BID by routine) or neb (rare use). In 06/2019 she was admitted to [facility] with left sided pneumonia, also treated for COPD exacerbation with steroids during this, course also complicated by AKI and Afib/RVR. Hypercapnic during  admission and started on Trilogy for discharge. She also carries diagnosis of CHF, made a few years ago, at least 2 admissions (usually [place]) for diuresis. With recent admission did have very elevated proBNPs and discharged on higher dose of Lasix (40 mg daily). Denies recent edema on this higher dose of diuretic. No orthopnea. She has not been doing a lot of physical activity during COVID, will notice very mild dyspnea with self care, no exertional CP or LH. Does find neuropathy can hinder her activity as well. Occasional cough when not having exacerbation, clear sputum. Did have hemoptysis with pneumonia, none outside of this. With first visit we discussed recommendation to repeat CXR, and also repeat TTE in stable outpatient state.    Subsequent history: At visit 10/2019 doing okay, occasional episodes of DOE but overall happy with level of function. Discussed potential for clinical trial enrollment, but she was feeling very well and did not want to do invasive testing unless changing. Admitted to [facility] 04/2020 for dyspnea and decompensated heart failure, diuresed and also given IV iron. At followup 06/2020 not quite back at baseline, but improving steadily. She was then admitted 07/2020 again for heart failure, and then again in 09/2020 for two weeks with decompensated heart failure, Afib/RVR, septic shock, and gangrenous cholecystitis. Shortly thereafter she was diagnosed with ATTR cardiac amyloid. Admitted 12/2020 with dyspnea and decompensated heart failure, diuresed and underwent DCCV. At followup that month slowing improving since discharge, though more hypoxemic than baseline, discussed anemia/iron and changed inhalers to non-DPI formulations. At followup 01/2021 feeling much better, more active, started tafamidis, no additional changes made. At followup 04/2021 doing well, spirometry improved, no changes made. At followup 10/2021 doing okay, no changes. At followup 05/2022 doing well, brief recent ED stay for low sats which was due to [place] malfunction, changed Advair/Spiriva to Stiolto to get rid of ICS, though this was ultimately not tolerated and went back to Advair/Spiriva. At followup 09/2022 visit early mainly for oxygen need documentation, doing very well, no other changes. At followup 03/2023 doing well aside from fall with pelvic fracture in interim, creatinine continuing to increase, no changes from COPD standpoint.    Interval history 03/2023: Since last visit  Admitted post fall in 01/2023, with resulting pelvic fractures.  Still with some pain from this as well as sciatica pain.  Noticing more dyspnea with having pain.  Still some pain, some dyspnea with this  Needed diuresis with the admission for the fall.  Feels fluid has been going well so far.  Oxygen unchanged at 3LPM, including with Trilogy at night.  Needing humidification for home concentrator.       Past medical, past surgical, family, and social histories reviewed and updated in Epic.    Review of systems is significant for:   A 12 point review of systems was negative except for pertinent items noted in the HPI.    Allergies   Allergen Reactions    Nitrofurantoin Anaphylaxis and Other (See Comments)    Lipitor [Atorvastatin] Muscle Pain     SOB, Headache, fatigue, sick on my stomach      Current Outpatient Medications   Medication Sig Dispense Refill    acetaminophen (TYLENOL) 500 MG tablet Take 2 tablets (1,000 mg total) by mouth Three (3) times a day.      albuterol HFA 90 mcg/actuation inhaler Inhale 2 puffs every eight (8) hours as needed for wheezing.      amiodarone (PACERONE) 200 MG tablet TAKE ONE TABLET BY  MOUTH ONCE DAILY 90 tablet 3    blood sugar diagnostic (ACCU-CHEK GUIDE TEST STRIPS) Strp Use to check blood sugar daily as instructed by clinic. Dx E11.9 For daily testing 100 strip 3    blood-glucose meter kit Use as instructed Daily testing 1 each 0    cranberry 500 mg cap Take 500 mg by mouth daily with evening meal.      cyclobenzaprine (FLEXERIL) 5 MG tablet Take 1 tablet (5 mg total) by mouth two (2) times a day as needed for muscle spasms. 30 tablet 3    ergocalciferol-1,250 mcg, 50,000 unit, (DRISDOL) 1,250 mcg (50,000 unit) capsule Take 1 capsule (1,250 mcg total) by mouth once a week. 12 capsule 3    estradioL (ESTRACE) 0.01 % (0.1 mg/gram) vaginal cream Insert 2 g into the vagina Two (2) times a week. 42.5 g 3    fluticasone propion-salmeteroL (ADVAIR HFA) 115-21 mcg/actuation inhaler Inhale 2 puffs Two (2) times a day. 12 g 11    gabapentin (NEURONTIN) 300 MG capsule Take 1 capsule (300 mg total) by mouth in the morning. 90 capsule 3    lancets Misc 1 each by Miscellaneous route daily. Accu Check 100 each 6    montelukast (SINGULAIR) 10 mg tablet Take 1 tablet (10 mg total) by mouth every morning. 90 tablet 2    NARCAN 4 mg/actuation nasal spray 1 spray into alternating nostrils once as needed (opioid overdose). PRN - Emergency use.      NON FORMULARY APPLY FROM NECK DOWN TWICE A DAY      OXYCONTIN 10 mg TR12 12 hr crush resistant ER/CR tablet Take 1 tablet (10 mg total) by mouth every twelve (12) hours.      OXYGEN-AIR DELIVERY SYSTEMS MISC 5 L by Miscellaneous route. Currently using 3  L/min via [place]      pantoprazole (PROTONIX) 20 MG tablet TAKE 1 TABLET(20 MG) BY MOUTH DAILY 90 tablet 3    pen needle, diabetic (BD ULTRA-FINE SHORT PEN NEEDLE) 31 gauge x 5/16 (8 mm) Ndle Give 4 units of insulin each night 100 each 1    rosuvastatin (CRESTOR) 5 MG tablet Take 1 tablet (5 mg total) by mouth every other day. 15 tablet 11    spironolactone (ALDACTONE) 25 MG tablet TAKE 1 TABLET(25 MG) BY MOUTH DAILY 90 tablet 3    tafamidis (VYNDAMAX) 61 mg cap Take 1 capsule (61 mg) by mouth daily. 30 capsule 11    tiotropium bromide (SPIRIVA RESPIMAT) 2.5 mcg/actuation inhalation mist Inhale 2 puffs daily. 4 g 11    torsemide (DEMADEX) 20 MG tablet Take 3 tablets (60 mg total) by mouth daily. 270 tablet 3    insulin glargine (BASAGLAR, LANTUS) 100 unit/mL (3 mL) injection pen Inject 0.04 mL (4 Units total) under the skin nightly.      metOLazone (ZAROXOLYN) 5 MG tablet Take 1 tablet (5 mg total) by mouth daily as needed (when instructed by cardiology clinic).       No current facility-administered medications for this visit.          Objective:   Objective     Physical Exam:   BP 100/57 (BP Site: R Arm, BP Position: Sitting, BP Cuff Size: Medium)  - Pulse 63  - Temp 36.2 ??C (97.1 ??F) (Temporal)  - Wt 96.2 kg (212 lb)  - SpO2 93%  - BMI 34.22 kg/m??   Body mass index is 34.22 kg/m??.    SpO2 at rest 94% 3LPM  Wt Readings from Last 6 Encounters:   04/09/23 96.2 kg (212 lb)   03/18/23 96.6 kg (213 lb)   02/23/23 97 kg (213 lb 12.8 oz)   02/18/23 97.9 kg (215 lb 14.4 oz)   02/10/23 98 kg (216 lb)   12/04/22 99.3 kg (219 lb)     General Appearance:    Alert, cooperative, no distress, appears stated age.   HEENT:    Normocephalic, without obvious abnormality, atraumatic. PERRL, conjunctiva clear. Nares normal, no drainage, no sinus tenderness. Oropharynx with no lesions. No facial telangectasias.   Neck:    Supple, trachea midline, no adenopathy.    Lungs:    Distant breath sounds b/l, no crackles or wheezes, respirations unlabored. No PA bruits.   Chest wall:   Expands symmetrically, no tenderness or deformity.   Cardiovascular:   Regular rate and rhythm, S1 and S2 normal, P2 not overly prominent, no murmur, rubs, or gallop appreciated. Neck exam with prominent carotid impulse, JVD not clearly visible when upright in wheelchair.   Abdomen:   Soft, non-tender, normal bowel sounds, no masses, no organomegaly   Extremities:     Extremities normal, no cyanosis, clubbing. Trace pitting edema, similar. No sclerodactyly.   Skin:   No rashes or lesions.   Neuro/psych:      No focal neurologic deficits; mood/affect normal.     Diagnostic Review:    Cardiac testing summary:  TTE 02/17/23 (reviewed): LA mod dil, EF >70%       RV normal, est PASP 46       No effusion. IVC normal  TTE 11/2021: EF >70%, gr 3DD, sev LVH       RV mild dil, nl fxn       No effusion. IVC dil/poor insp collapse  TTE 03/2021:   RHC 01/09/21: RA 23, PA 85/40 (55), PCW 32       CO/CI 5.3/2.5 (f,td), PA sat 57%, PVR 4.2 WU  TTE 09/2020: LA mild dil, EF >55%       RV ULN size, HK, tr TR, can't est PASP       No effusion. IVC dil, poor insp collapse  TTE 04/2020: LA 41, EF >55%, LVH (16)       RV normal, mild HK, TAPSE 25, can't est PASP       No effusion. IVC normal.  TTE 10/2019: LA 42, EF >55%       RV mild dil/HK, can't est PASP       No effusion. IVC dil, poor insp collapse  TTE 06/2019: LA 38, EF 65-70%, grade 2 DD, LVH (13-14)       RV mild dil, low nl fxn, TAPSE 17, mild-mod Tr, est PASP 64+CVP       Tr effusion. IVC nl size, poor insp collapse  TTE 04/2018 (cone): LA 40, EF 60-65%, grade 1 DD       RV normal, mild TR, est PASP within the normal range       No effusion. IVC normal    CXR 02/14/23 (reviewed): small effusions       Stable cardiomegaly. Bilat lower lung opacities, similar  CXR 04/2022: Similar bilat mid and lower lung opacities.        Small bilateral pleural effusions.        Stable enlarged cardiac silhouette.   CXR 08/2020: cardiomegaly       Pulmonary vascular congestion with increased markings       Hyperinflated lungs, flat diagphragms  CXR  04/2020: cardiomegaly       Hazy interstitial opacities with vascular congestion       Likely trace L effusion.  CXR 08/2019:  stable cardiomegaly       Interval resolution of LLL consolidation.       Stable parenchymal scarring in the left midlung zone.       Interval decreased now trace left pleural effusion.    CTA chest 10/2018 (cone): Cardiovascular: There is no demonstrable pulmonary embolus. There is  no thoracic aortic aneurysm or dissection. There is calcification at  the origin of the left subclavian artery. Other visualized great  vessels appear unremarkable. There is aortic atherosclerosis. There  are foci of coronary artery calcification evident. There is a small  amount of pericardial fluid, within the physiologic range, stable.  Pericardium does not appear appreciably thickened.  Prominence of the main pulmonary outflow tract is noted with a  measured diameter of 3.7 cm.  Mediastinum/Nodes: There is a subcentimeter nodular opacity in the  left lobe of the thyroid, stable. There are multiple subcentimeter  mediastinal lymph nodes again noted. There are prominent right  paratracheal lymph nodes with largest lymph node in this area  measuring 1.8 x 1.4 cm, marginally smaller than on prior study.  Aortopulmonary window lymph nodes appear slightly smaller than on  previous study. No new lymph node prominence is evident on this  study. No esophageal lesions are evident.  Lungs/Pleura: There is atelectatic change in the lung base regions,  stable. There is no frank edema or consolidation. A mild degree of  centrilobular emphysematous change appears stable. No appreciable  pleural effusion.  Upper Abdomen: There is a cyst in the medial upper pole right kidney  measuring 1.5 x 1.5 cm. A cyst is noted in the lateral mid left  kidney measuring 1.5 x 1.3 cm. There is aortic atherosclerosis as  well as calcification in the proximal visualized great vessels.  Visualized upper abdominal structures otherwise appear unremarkable.  Musculoskeletal: There is stable anterior wedging of the L1  vertebral body. There is multilevel degenerative change in the  thoracic spine which appear stable. No blastic or lytic bone lesions  are evident. No evident chest wall lesions.     CT chest 04/2018 (cone): Cardiovascular: Mild cardiomegaly. Small pericardial  effusion/thickening. Left anterior descending and right coronary  atherosclerosis. Atherosclerotic nonaneurysmal thoracic aorta.  Dilated main pulmonary artery (3.6 cm diameter). No central  pulmonary emboli.  Mediastinum/Nodes: Subcentimeter hypodense posterior left thyroid  lobe nodule. Unremarkable esophagus. No axillary adenopathy.  Enlarged right paratracheal nodes up to 1.6 cm (series 2/image 54).  Enlarged 1.1 cm AP window node (series 2/image 51). No  pathologically enlarged hilar nodes.  Lungs/Pleura: No pneumothorax. Trace dependent left pleural  effusion. No right pleural effusion. No acute consolidative airspace  disease, lung masses or significant pulmonary nodules. Minimal  interlobular septal thickening throughout both lungs. Several mildly  thickened parenchymal bands in the dependent right middle lobe,  lingula and right lower lobe. No significant regions of  bronchiectasis. Mild centrilobular emphysema with mild diffuse  bronchial wall thickening.  Upper abdomen: Partially visualized simple 1.6 cm medial upper right  renal cyst. Mild scarring in the visualized upper right kidney.  Musculoskeletal: No aggressive appearing focal osseous lesions.  Stable mild chronic L1 vertebral compression fracture. Moderate  thoracic spondylosis.     V/Q scan : none    08/2019: 152 meters. HR 79->101, O2 sat 91->88% on 3LPM, needing 4LPM. Max Borg 2.  Pulmonary Function Testing:  Date FEV1 FVC TLC FRC RV DLCO    04/2021  1.12 (66%)  1.91 (86%)        08/2019  0.99 (56%)  1.83 (79%)     5.0 (26%)    04/2018  0.98 (59%)  1.88 (89%)  68%   63%   (29%)                Overnight oximetry 01/2021 (NIPPV/5L): 48 min <89%, avg 90%, nadir 81%, ODI 15  Overnight oximetry 08/2019: 20 min <89% (total time 6.5 hours), avg 90%, nadir 94%       Compliance data with very good usage  PSG : done in past but not available     Relevant Serologies:   Labs 08/2019: ANA pos 1:160 homogenous, ENA neg, RF/CCP neg  Labs 12/2018: ANCA neg, ACE 31    Routine Labs:        Date NTproBNP Creatinine  Potassium  Bicarbonate  Hemoglobin  abs eos    01/2023 BNP 840->601 3.2 4.6 24 13.3 200    07/2022 BNP 167 2.4 4.4  26 13.6     04/2022 BNP 200 2.6   31 11.7     07/2021 BNP 445 2.6   30  9.7  300    03/2021 BNP 567 1.5 4.1  31  8.2  200    12/2020 BNP 926->479 1.3 3.8  33  8.9     11/2020  3800   BNP 567              07/2020  BNP 603              05/2020  1970  1.3  4.3  32  11.2     10/2019  2220  1.1    34        08/2019  1410              07/2019  1270  0.9  4.1  29  10.0     06/2019  6600->13K             11/2018  BNP 253              08/2018  BNP 531

## 2023-04-14 ENCOUNTER — Ambulatory Visit: Admit: 2023-04-14 | Discharge: 2023-04-15 | Payer: MEDICARE

## 2023-04-14 ENCOUNTER — Institutional Professional Consult (permissible substitution): Admit: 2023-04-14 | Discharge: 2023-04-15 | Payer: MEDICARE

## 2023-04-14 ENCOUNTER — Ambulatory Visit: Admit: 2023-04-14 | Discharge: 2023-04-15 | Payer: MEDICARE | Attending: Adult Health | Primary: Adult Health

## 2023-04-14 DIAGNOSIS — I5032 Chronic diastolic (congestive) heart failure: Principal | ICD-10-CM

## 2023-04-14 DIAGNOSIS — E1122 Type 2 diabetes mellitus with diabetic chronic kidney disease: Principal | ICD-10-CM

## 2023-04-14 DIAGNOSIS — N184 Chronic kidney disease, stage 4 (severe): Principal | ICD-10-CM

## 2023-04-14 DIAGNOSIS — Z794 Long term (current) use of insulin: Principal | ICD-10-CM

## 2023-04-14 LAB — IRON PANEL
IRON SATURATION: 19 % — ABNORMAL LOW (ref 20–55)
IRON: 44 ug/dL — ABNORMAL LOW
TOTAL IRON BINDING CAPACITY: 235 ug/dL — ABNORMAL LOW (ref 250–425)

## 2023-04-14 LAB — CBC
HEMATOCRIT: 39.1 % (ref 34.0–44.0)
HEMOGLOBIN: 12.7 g/dL (ref 11.3–14.9)
MEAN CORPUSCULAR HEMOGLOBIN CONC: 32.4 g/dL (ref 32.0–36.0)
MEAN CORPUSCULAR HEMOGLOBIN: 29.8 pg (ref 25.9–32.4)
MEAN CORPUSCULAR VOLUME: 91.7 fL (ref 77.6–95.7)
MEAN PLATELET VOLUME: 9.3 fL (ref 6.8–10.7)
PLATELET COUNT: 195 10*9/L (ref 150–450)
RED BLOOD CELL COUNT: 4.26 10*12/L (ref 3.95–5.13)
RED CELL DISTRIBUTION WIDTH: 13.6 % (ref 12.2–15.2)
WBC ADJUSTED: 8.9 10*9/L (ref 3.6–11.2)

## 2023-04-14 LAB — BASIC METABOLIC PANEL
ANION GAP: 7 mmol/L (ref 5–14)
BLOOD UREA NITROGEN: 70 mg/dL — ABNORMAL HIGH (ref 9–23)
BUN / CREAT RATIO: 28
CALCIUM: 9.1 mg/dL (ref 8.7–10.4)
CHLORIDE: 107 mmol/L (ref 98–107)
CO2: 27.2 mmol/L (ref 20.0–31.0)
CREATININE: 2.48 mg/dL — ABNORMAL HIGH
EGFR CKD-EPI (2021) FEMALE: 19 mL/min/{1.73_m2} — ABNORMAL LOW (ref >=60)
GLUCOSE RANDOM: 212 mg/dL — ABNORMAL HIGH (ref 70–179)
POTASSIUM: 4.6 mmol/L (ref 3.4–4.8)
SODIUM: 141 mmol/L (ref 135–145)

## 2023-04-14 LAB — B-TYPE NATRIURETIC PEPTIDE: B-TYPE NATRIURETIC PEPTIDE: 120.47 pg/mL — ABNORMAL HIGH (ref <=100)

## 2023-04-14 LAB — HEMOGLOBIN A1C
ESTIMATED AVERAGE GLUCOSE: 120 mg/dL
HEMOGLOBIN A1C: 5.8 % — ABNORMAL HIGH (ref 4.8–5.6)

## 2023-04-14 LAB — MAGNESIUM: MAGNESIUM: 2.2 mg/dL (ref 1.6–2.6)

## 2023-04-14 LAB — FERRITIN: FERRITIN: 261.2 ng/mL

## 2023-04-14 MED ORDER — CYCLOBENZAPRINE 7.5 MG TABLET
ORAL_TABLET | Freq: Two times a day (BID) | ORAL | 11 refills | 30 days | Status: CP | PRN
Start: 2023-04-14 — End: 2024-04-13

## 2023-04-14 MED ADMIN — vutrisiran (AMVUTTRA) syringe 25 mg: 25 mg | SUBCUTANEOUS | @ 19:00:00 | Stop: 2023-04-14

## 2023-04-14 NOTE — Unmapped (Signed)
[facility] Note    Referring Provider: Carin Hock, MD  [address]  [address]  [place],  [place] [address]   Primary Provider: Artelia Laroche, MD  [address]  [place] [address]   Other Providers:  Dr Luretha Murphy    Reason for Visit:  Barbara Huber is a 82 y.o. female being seen for routine visit and continued care of cardiac amyloidosis .    Assessment & Plan:  1. 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 SPEP/UPEP/free light chains without concern. 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 Daughter is informed and has seen cardiology [name]).   - for her hATTR amyloid with neuropathy leading to recent fall, have ordered Amvuttra 25mg  q56mos. First injection today. Will start vitamin A supplementation.   - DC'd Jardiance 10mg  daily due to recurrent UTIs  - Weight at baseline today at 211 lb, target 210-212 at home. Continue torsemide 40mg  daily with extra 20mg  PRN.   - Continue Tafamidis 61 mg daily   - Continue spironolactone 25 mg daily   - She is not interested in CardioMEMS at this time - revisited this today.    2. Persistent atrial fibrillation  - CHA2DS2-VASc score = 5 (1-gender, 2-Age, 1-DM, 1-HTN)  - We stopped Eliquis given recurrent anemia, shared decision making w/ pt and daughter. She is not interested in Bartelso (or any procedures) at this time  - She had been in afib since at least 07/2020; now s/p cardioversion 01/10/21 and continues to be in NSR since  - On amiodarone 200mg  daily - increased it back herself after seeing no improvement in tremors/jerking with decreased dose  - amiodarone monitoring - last PFTs in 04/2021. TSH/LFTs normal 01/2023    3. [name]  - Cr staying around 2.3-3.0; 2.48 today  - following with nephrology    4. COPD  - home O2 3-4L [place] - followed by pulm  - on Advair and Spiriva    5. Iron deficiency anemia  - Finished IV Iron - ferritin 178. No longer anemic, Hgb 12.7  Lab Results   Component Value Date    FERRITIN 261.2 04/14/2023     6. Anxiety  - previously on Zoloft    Follow-up:  Return in about 3 months (around 07/14/2023) for Return HF.     History of Present Illness:  Barbara Huber is a 82 y.o. female with PMHx of COPD on 3L home [place], CHF, [name], [name], [name] She was initially admitted with decompensated [name] in 2021 in setting of  Septic Shock d/t Gangrenous Cholecystitis s/p cholecystectomy. She underwent further evaluation with PYP scan which showed TTR amyloid. She has since been followed in clinic for management of her [name]. In 2022, had numerous admissions for weakness/urosepsis, shortness of breath/orthopnea/weight gain, lightheadedness/SOB/Blurry vision found to be anemic.    Summary of most recent visits:  - 01/16/22 [facility]): Feeling well from a cardiac standpoint. Started Zoloft for anxiety  - 02/13/22 [facility]): Weight 210-211. Feeling well. Taking extra torsemide 20mg  about every other day, which was stopped for elevated Cr.   - 03/13/22 ([name]): Weight  201-216, took metolazone PRN, weight stabilized at 210lbs. Considering cardioMEMS.   - 05/13/22 ([name]): Had 3 iron infusions. Doing well w/ volume, weight 211-212lbs. Cooking, staying active around house.   - 05/21/22 ([name]): CP/SOB. Weight 217 from 213. She took metolazone x1 dose.   - 05/22/22 ([name]): Ed follow up. Back down to 213lbs. Still having CP w/ certain movement.   - 06/05/22 ([name]): Weight 214-216lb, should be 212lb.   - 06/09/22, 06/11/22, 06/22/22 ([name]): Weight down from 217 to 213lb, 211lb at home. Feeling well by 6/26.   - 07/15/22 ([name]): Switched inhaler back to Advair/Spiriva with improvement in breathing and O2 sat. Weight 207lbs, feeling more like herself.   - 08/19/22 ([name]): Feeling well. Weight 208-210lbs.   - 10/22/22 ([name]): Feeling well. Weight has been 210-214lbs.   - 12/04/22 ([name]): doing well overall, weight a bit up so taking extra torsemide.   - 02/10/23 ([name]): Weight 213-214lbs, feeling well, eating a lot. Had taken metolazone earlier in the week.   - 02/23/23 [facility]): Hospital follow up after mechanical fall with nondisplaced fractures of the left superior and inferior pubic rami. Taking torsemide 40mg , weight 211.     Interval history  Today [name] to the clinic for routine follow up. She is overall doing well, but still having a lot of back/sciatic nerve pain. The flexeril at 5mg  hasn't done anything to help, but she did not have any side effects. Her weight is around 210-212; she has no swelling, dyspnea, or bloating. She has not taken metolazone in awhile. She has not gotten any Amvuttra yet.     Cardiovascular History & Procedures:    Cath / PCI:  none    CV Surgery:   none    EP Procedures and Devices:  none    Non-Invasive Evaluation(s):    Echo:  02/17/23    1. The left ventricle is normal in size with moderately increased wall  thickness.    2. The left ventricular systolic function is hyperdynamic, LVEF is visually  estimated at >70%.    3. The left atrium is moderately dilated in size.    4. The right ventricle is normal in size, with normal systolic function.    5. There is mild pulmonary hypertension.    12/09/21  Summary    1. The left ventricle is normal in size with severely increased wall  thickness.    2. The left ventricular systolic function is hyperdynamic, LVEF is visually  estimated at >70%.    3. There is grade III diastolic dysfunction (severely elevated filling  pressure).    4. The left atrium is moderately to severely dilated in size.    5. The right ventricle is mildly dilated in size, with normal systolic  function.    6. The right atrium is mildly dilated  in size.    7. IVC size and inspiratory change suggest elevated right atrial pressure.  (10-20 mmHg).    04/24/21   1. The left ventricle is normal in size with mildly increased wall  thickness.    2. The left ventricular systolic function is normal, LVEF is visually  estimated at > 55%.    3. There is grade II diastolic dysfunction (elevated filling pressure).    4. Mitral annular calcification is present (mild).    5. The left atrium is mildly dilated in size.    6. The right ventricle is mildly dilated in size, with low normal systolic  function.  7. IVC size and inspiratory change suggest elevated right atrial pressure.  (10-20 mmHg).    09/30/20  Summary    1. Limited study to assess systolic function.    2. IVC size and inspiratory change suggest normal right atrial pressure.  (0-5 mmHg).    3. The left ventricle is normal in size with mildly to moderately increased  wall thickness.    4. The left ventricular systolic function is normal with no obvious wall  motion abnormalities, LVEF is visually estimated at > 55%.    5. Mitral annular calcification is present.    6. The left atrium is mildly dilated in size.    7. The right ventricle is upper normal in size, with reduced systolic  Function.    Cardiac CT/MRI/Nuclear Tests:   None    6 Minute Walk:  None    Cardiopulmonary Stress Tests:   None               Other Past Medical History:  See below for the complete EPIC list of past medical and surgical history.      Allergies:  Nitrofurantoin and Lipitor [atorvastatin]    Current Medications:  Current Outpatient Medications   Medication Sig Dispense Refill    acetaminophen (TYLENOL) 500 MG tablet Take 2 tablets (1,000 mg total) by mouth Three (3) times a day.      albuterol HFA 90 mcg/actuation inhaler Inhale 2 puffs every eight (8) hours as needed for wheezing.      amiodarone (PACERONE) 200 MG tablet TAKE ONE TABLET BY MOUTH ONCE DAILY 90 tablet 3    blood sugar diagnostic (ACCU-CHEK GUIDE TEST STRIPS) Strp Use to check blood sugar daily as instructed by clinic. Dx E11.9 For daily testing 100 strip 3    blood-glucose meter kit Use as instructed Daily testing 1 each 0    cranberry 500 mg cap Take 500 mg by mouth daily with evening meal.      ergocalciferol-1,250 mcg, 50,000 unit, (DRISDOL) 1,250 mcg (50,000 unit) capsule Take 1 capsule (1,250 mcg total) by mouth once a week. 12 capsule 3    estradioL (ESTRACE) 0.01 % (0.1 mg/gram) vaginal cream Insert 2 g into the vagina Two (2) times a week. 42.5 g 3    FEROSUL 325 mg (65 mg iron) tablet Take 1 tablet (325 mg total) by mouth every other day.      fluticasone propion-salmeteroL (ADVAIR HFA) 115-21 mcg/actuation inhaler Inhale 2 puffs Two (2) times a day. 12 g 11    gabapentin (NEURONTIN) 300 MG capsule Take 1 capsule (300 mg total) by mouth in the morning. 90 capsule 3    insulin glargine (BASAGLAR, LANTUS) 100 unit/mL (3 mL) injection pen Inject 0.04 mL (4 Units total) under the skin nightly.      lancets Misc 1 each by Miscellaneous route daily. Accu Check 100 each 6    metOLazone (ZAROXOLYN) 5 MG tablet Take 1 tablet (5 mg total) by mouth daily as needed (when instructed by cardiology clinic).      montelukast (SINGULAIR) 10 mg tablet Take 1 tablet (10 mg total) by mouth every morning. 90 tablet 2    NARCAN 4 mg/actuation nasal spray 1 spray into alternating nostrils once as needed (opioid overdose). PRN - Emergency use.      NON FORMULARY APPLY FROM NECK DOWN TWICE A DAY      OXYCONTIN 10 mg TR12 12 hr crush resistant ER/CR tablet Take 1 tablet (10 mg total) by  mouth every twelve (12) hours.      OXYGEN-AIR DELIVERY SYSTEMS MISC 5 L by Miscellaneous route. Currently using 3  L/min via [place]      pantoprazole (PROTONIX) 20 MG tablet TAKE 1 TABLET(20 MG) BY MOUTH DAILY 90 tablet 3    pen needle, diabetic (BD ULTRA-FINE SHORT PEN NEEDLE) 31 gauge x 5/16 (8 mm) Ndle Give 4 units of insulin each night 100 each 1    rosuvastatin (CRESTOR) 5 MG tablet Take 1 tablet (5 mg total) by mouth every other day. 15 tablet 11    spironolactone (ALDACTONE) 25 MG tablet TAKE 1 TABLET(25 MG) BY MOUTH DAILY 90 tablet 3    tafamidis (VYNDAMAX) 61 mg cap Take 1 capsule (61 mg) by mouth daily. 30 capsule 11    tiotropium bromide (SPIRIVA RESPIMAT) 2.5 mcg/actuation inhalation mist Inhale 2 puffs daily. 4 g 11    torsemide (DEMADEX) 20 MG tablet Take 3 tablets (60 mg total) by mouth daily. 270 tablet 3    cyclobenzaprine (FLEXERIL) 5 MG tablet Take 1.5 tablets (7.5 mg total) by mouth two (2) times a day as needed for muscle spasms. 90 tablet 1    vitamin A-3,000 mcg RAE, 10,000 UNIT, 3,000 mcg RAE (10,000 UNIT) capsule Take 1 capsule (3,000 mcg of RAE total) by mouth daily.      vutrisiran (AMVUTTRA) 25 mg/0.5 mL injection Inject 0.5 mL (25 mg total) under the skin once. Every 12 Weeks       No current facility-administered medications for this visit.       Family History:  The patient's family history includes Cancer in her father; Hypertension in her mother; No Known Problems in her brother, brother, maternal aunt, maternal grandfather, maternal grandmother, maternal uncle, paternal aunt, paternal grandfather, paternal grandmother, paternal uncle, sister, sister, and another family member.    Social history:  She  reports that she quit smoking about 5 years ago. Her smoking use included cigarettes. She has never used smokeless tobacco. She reports that she does not drink alcohol and does not use drugs.    Review of Systems:  As per HPI.  Rest of the review of ten systems is negative or unremarkable except as stated above.    Physical Exam:  VITAL SIGNS:   Vitals:    04/14/23 1318   BP: 124/72   Pulse: 70   SpO2: 91%       Wt Readings from Last 3 Encounters:   04/14/23 95.7 kg (211 lb)   04/14/23 95.7 kg (211 lb)   04/09/23 96.2 kg (212 lb)      Today's Body mass index is 34.06 kg/m??.   Height: 167.6 cm (5' 6)  CONSTITUTIONAL: well-appearing in no acute distress  EYES: Conjunctivae and sclerae clear and anicteric.  ENT: Benign.   CARDIOVASCULAR: JVP not seen above the clavicle with HOB at 90 degrees. Rate and rhythm are regular.  There is no lifts or heaves.  Normal S1, S2. There is no murmur, gallops or rubs.  Radial and pedal pulses are 2+, bilaterally.   There is no edema to ankles, bilaterally.   RESPIRATORY: Decreased breath sounds at bases.  There are no wheezes.  GASTROINTESTINAL: Soft, non-tender, with audible bowel sounds. Abdomen nondistended.  Liver is nonpalpable.  SKIN: No rashes, ecchymosis or petechiae.  Warm, well perfused.   MUSCULOSKELETAL:  no joint swelling   NEURO/PSYCH: Appropriate mood and affect. Alert and oriented to person, place, and time. No gross motor or sensory deficits evident.  Pertinent Laboratory Studies:   Office Visit on 04/14/2023   Component Date Value Ref Range Status    Hemoglobin A1C 04/14/2023 5.8 (H)  4.8 - 5.6 % Final    Estimated Average Glucose 04/14/2023 120  mg/dL Final    Sodium 16/09/9603 141  135 - 145 mmol/L Final    Potassium 04/14/2023 4.6  3.4 - 4.8 mmol/L Final    Chloride 04/14/2023 107  98 - 107 mmol/L Final    CO2 04/14/2023 27.2  20.0 - 31.0 mmol/L Final    Anion Gap 04/14/2023 7  5 - 14 mmol/L Final    BUN 04/14/2023 70 (H)  9 - 23 mg/dL Final    Creatinine 54/08/8118 2.48 (H)  0.55 - 1.02 mg/dL Final    BUN/Creatinine Ratio 04/14/2023 28   Final    eGFR [name]-EPI (2021) Female 04/14/2023 19 (L)  >=60 mL/min/1.74m2 Final    Glucose 04/14/2023 212 (H)  70 - 179 mg/dL Final    Calcium 14/78/2956 9.1  8.7 - 10.4 mg/dL Final    Iron 21/30/8657 44 (L)  50 - 170 ug/dL Final    TIBC 84/69/6295 235 (L)  250 - 425 ug/dL Final    Iron Saturation (%) 04/14/2023 19 (L)  20 - 55 % Final    Ferritin 04/14/2023 261.2  7.3 - 270.7 ng/mL Final    WBC 04/14/2023 8.9  3.6 - 11.2 10*9/L Final    RBC 04/14/2023 4.26  3.95 - 5.13 10*12/L Final    HGB 04/14/2023 12.7  11.3 - 14.9 g/dL Final    HCT 28/41/3244 39.1  34.0 - 44.0 % Final    MCV 04/14/2023 91.7  77.6 - 95.7 fL Final    MCH 04/14/2023 29.8  25.9 - 32.4 pg Final    MCHC 04/14/2023 32.4  32.0 - 36.0 g/dL Final    RDW 12/30/7251 13.6  12.2 - 15.2 % Final    MPV 04/14/2023 9.3  6.8 - 10.7 fL Final    Platelet 04/14/2023 195  150 - 450 10*9/L Final    BNP 04/14/2023 120.47 (H)  <=100 pg/mL Final    Magnesium 04/14/2023 2.2  1.6 - 2.6 mg/dL Final   Office Visit on 02/23/2023   Component Date Value Ref Range Status    BNP 02/23/2023 601.16 (H)  <=100 pg/mL Final    Magnesium 02/23/2023 2.4  1.6 - 2.6 mg/dL Final    Sodium 66/44/0347 138  135 - 145 mmol/L Final    Potassium 02/23/2023 5.3 (H)  3.4 - 4.8 mmol/L Final    Chloride 02/23/2023 104  98 - 107 mmol/L Final    CO2 02/23/2023 23.2  20.0 - 31.0 mmol/L Final    Anion Gap 02/23/2023 11  5 - 14 mmol/L Final    BUN 02/23/2023 78 (H)  9 - 23 mg/dL Final    Creatinine 42/59/5638 2.68 (H)  0.55 - 1.02 mg/dL Final    BUN/Creatinine Ratio 02/23/2023 29   Final    eGFR [name]-EPI (2021) Female 02/23/2023 17 (L)  >=60 mL/min/1.8m2 Final    Glucose 02/23/2023 261 (H)  70 - 179 mg/dL Final    Calcium 75/64/3329 9.8  8.7 - 10.4 mg/dL Final    Albumin 51/88/4166 3.7  3.4 - 5.0 g/dL Final    Total Protein 02/23/2023 7.8  5.7 - 8.2 g/dL Final    Total Bilirubin 02/23/2023 0.5  0.3 - 1.2 mg/dL Final    AST 06/26/1600 14  <=34 U/L Final    ALT  02/23/2023 <7 (L)  10 - 49 U/L Final    Alkaline Phosphatase 02/23/2023 94  46 - 116 U/L Final   No results displayed because visit has over 200 results.      Office Visit on 02/10/2023   Component Date Value Ref Range Status    BNP 02/10/2023 110.10 (H)  <=100 pg/mL Final    Sodium 02/10/2023 138  135 - 145 mmol/L Final    Potassium 02/10/2023 4.4  3.4 - 4.8 mmol/L Final    Chloride 02/10/2023 101  98 - 107 mmol/L Final    CO2 02/10/2023 28.8  20.0 - 31.0 mmol/L Final    Anion Gap 02/10/2023 8  5 - 14 mmol/L Final    BUN 02/10/2023 69 (H)  9 - 23 mg/dL Final    Creatinine 45/40/9811 2.64 (H)  0.55 - 1.02 mg/dL Final    BUN/Creatinine Ratio 02/10/2023 26   Final eGFR [name]-EPI (2021) Female 02/10/2023 18 (L)  >=60 mL/min/1.48m2 Final    Glucose 02/10/2023 107  70 - 179 mg/dL Final    Calcium 91/47/8295 9.5  8.7 - 10.4 mg/dL Final    Magnesium 62/13/0865 2.5  1.6 - 2.6 mg/dL Final       Lab Results   Component Value Date    PRO-BNP 3,254.0 (H) 12/04/2022    PRO-BNP 3,420.0 (H) 07/03/2021    PRO-BNP 905.0 (H) 03/21/2021    Creatinine 2.48 (H) 04/14/2023    Creatinine 2.68 (H) 02/23/2023    Creatinine 0.79 01/06/2011    BUN 70 (H) 04/14/2023    BUN 78 (H) 02/23/2023    BUN 20 01/06/2011    Sodium 141 04/14/2023    Sodium 140 01/06/2011    Potassium 4.6 04/14/2023    Potassium 4.1 01/06/2011    CO2 27.2 04/14/2023    CO2 30 01/06/2011    Magnesium 2.2 04/14/2023    Magnesium 2.0 01/06/2011    Total Bilirubin 0.5 02/23/2023    INR 2.15 07/01/2021    INR 1.0 01/06/2011       Lab Results   Component Value Date    Digoxin Level 0.9 10/01/2020       Lab Results   Component Value Date    TSH 2.196 02/15/2023    Cholesterol 95 12/06/2021    Triglycerides 66 12/06/2021    HDL 42 12/06/2021    Non-HDL Cholesterol 53 (L) 12/06/2021    LDL Calculated 40 12/06/2021       Lab Results   Component Value Date    WBC 8.9 04/14/2023    WBC 12.5 (H) 01/06/2011    HGB 12.7 04/14/2023    HGB 12.9 01/06/2011    HCT 39.1 04/14/2023    HCT 40.8 01/06/2011    Platelet 195 04/14/2023    Platelet 260 01/06/2011       Pertinent Test Results from Today:  None    Other pertinent records were reviewed.    The following are further history from the patient's EPIC record for reference:     Past Medical History:   Diagnosis Date    Acute kidney injury superimposed on chronic kidney disease (CMS-HCC) 10/11/2020    Acute on chronic diastolic (congestive) heart failure (CMS-HCC) 08/23/2020    Acute on chronic diastolic congestive heart failure (CMS-HCC)     AKI (acute kidney injury) (CMS-HCC) 04/14/2015    Lab Results  Component  Value  Date     CREATININE  1.90 (H)  06/12/2021     Had a  bump in her creatinine when she was taking her diuretics every day.  She is currently taking 40 mg daily of torsemide and 50 mg of spironolactone.  Her volume status is fragile.  Previously when she stopped her diuretic she becomes short of breath.  Plan: We will check her BMP today.  We will likely have to go to 40    Arthritis     Calculus of kidney     Calculus of ureter     CHF (congestive heart failure) (CMS-HCC)     Chronic atrial fibrillation (CMS-HCC) 07/20/2019    COPD (chronic obstructive pulmonary disease) (CMS-HCC)     Diabetes (CMS-HCC)     Gangrenous cholecystitis 10/11/2020    Generalized edema  06/17/2021    GERD (gastroesophageal reflux disease)     Hydronephrosis     Hypertension     Hyponatremia 10/11/2020    Intermediate coronary syndrome (CMS-HCC) 03/13/2014    Lower extremity edema 09/28/2020    Lumbar stenosis     Microscopic hematuria     Nausea alone     Nephrolithiasis 04/17/2016    Neuropathy     Nocturia     Nocturia 07/01/2017    Other chronic cystitis     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  See plans for anemia, AKI  We are also tapering her gabapentin.  Currently on 300 mg 3 times daily.  We will decrease it to twice daily, and then nightly.  She states she is not having any recurrence of her pain.    Pulmonary hypertension (CMS-HCC)     Renal colic     Shortness of breath 04/28/2020    Sleep apnea     Unstable angina pectoris (CMS-HCC) 03/13/2014       Past Surgical History:   Procedure Laterality Date    BACK SURGERY  1995    CARPAL TUNNEL RELEASE Left 2014    HYSTERECTOMY  1971    IR INSERT CHOLECYSTOSMY TUBE PERCUTANEOUS  10/02/2020    IR INSERT CHOLECYSTOSMY TUBE PERCUTANEOUS 10/02/2020 Braulio Conte, MD IMG VIR [facility]    LUMBAR DISC SURGERY      PR REMOVAL GALLBLADDER N/A 10/06/2020    Procedure: CHOLECYSTECTOMY;  Surgeon: Katherina Mires, MD;  Location: MAIN OR [facility];  Service: Trauma    PR RIGHT HEART CATH O2 SATURATION & CARDIAC OUTPUT N/A 09/30/2020    Procedure: Right Heart Catheterization;  Surgeon: Neal Dy, MD;  Location: [facility] CATH;  Service: Cardiology    PR RIGHT HEART CATH O2 SATURATION & CARDIAC OUTPUT N/A 01/09/2021    Procedure: Right Heart Catheterization;  Surgeon: Lesle Reek, MD;  Location: [facility] CATH;  Service: Cardiology

## 2023-04-14 NOTE — Unmapped (Signed)
ORTHOPAEDIC NOTE     Guida Asman L. Lavona Norsworthy, PA-C        Kiziah Pin    MRN: [id number]  DOB: February 24, 1941    Date of visit: 04/14/2023    Clinic location: [place]     ASSESSMENT:     Asymptomatic left superior and inferior pubic rami fracture sustained on 02/15/2023     PLAN:     She may resume all normal activities  She has a single visit left for physical therapy and I have recommended she asked about sciatica exercises  She is interested in a lumbar ESI and I referred her to the spine center  -Advised OTC analgesic PRN pain  -Discussed treatment options and patient was amenable to the above plan and was instructed to call and be seen if there is any increasing pain or concerns.     Follow up: Myself as needed       Chief Complaint:     Recheck pelvis     SUBJECTIVE:     HPI: Barbara Huber is a  82 y.o. with a PMHx as below presenting to Clinic for reevaluation of left superior and inferior pubic rami fracture sustained on 02/15/2023.  She has been weightbearing as tolerated working with therapy.  She states that she has no pain in her left hip but does have intermittent numbness and pain down her right leg.  Denies bowel or bladder changes.       Allergies  Allergies   Allergen Reactions    Nitrofurantoin Anaphylaxis and Other (See Comments)    Lipitor [Atorvastatin] Muscle Pain     SOB, Headache, fatigue, sick on my stomach     Past Medical History  Past Medical History:   Diagnosis Date    Acute kidney injury superimposed on chronic kidney disease (CMS-HCC) 10/11/2020    Acute on chronic diastolic (congestive) heart failure (CMS-HCC) 08/23/2020    Acute on chronic diastolic congestive heart failure (CMS-HCC)     AKI (acute kidney injury) (CMS-HCC) 04/14/2015    Lab Results  Component  Value  Date     CREATININE  1.90 (H)  06/12/2021     Had a bump in her creatinine when she was taking her diuretics every day.  She is currently taking 40 mg daily of torsemide and 50 mg of spironolactone.  Her volume status is fragile.  Previously when she stopped her diuretic she becomes short of breath.  Plan: We will check her BMP today.  We will likely have to go to 40    Arthritis     Calculus of kidney     Calculus of ureter     CHF (congestive heart failure) (CMS-HCC)     Chronic atrial fibrillation (CMS-HCC) 07/20/2019    COPD (chronic obstructive pulmonary disease) (CMS-HCC)     Diabetes (CMS-HCC)     Gangrenous cholecystitis 10/11/2020    Generalized edema  06/17/2021    GERD (gastroesophageal reflux disease)     Hydronephrosis     Hypertension     Hyponatremia 10/11/2020    Intermediate coronary syndrome (CMS-HCC) 03/13/2014    Lower extremity edema 09/28/2020    Lumbar stenosis     Microscopic hematuria     Nausea alone     Nephrolithiasis 04/17/2016    Neuropathy     Nocturia     Nocturia 07/01/2017    Other chronic cystitis     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  See  plans for anemia, AKI  We are also tapering her gabapentin.  Currently on 300 mg 3 times daily.  We will decrease it to twice daily, and then nightly.  She states she is not having any recurrence of her pain.    Pulmonary hypertension (CMS-HCC)     Renal colic     Shortness of breath 04/28/2020    Sleep apnea     Unstable angina pectoris (CMS-HCC) 03/13/2014        PHYSICAL EXAM:     MSK: Pelvis and bilateral lower extremities  Inspection: No edema, no erythema, skin intact  Palpation: No tenderness when I palpate throughout the left hip and pelvis  ROM: Full range of motion of the left hip  Strength: Adequate symmetric strength bilateral lower extremities  normal sensation RLE LLE  Dorsalis pedal pulses easily palpable      Imaging   Three views of the pelvis AP, Inlet, Outlet independently reviewed and interpreted by myself show stable alignment of minimally displaced left superior and inferior pubic rami fractures with callus formation. No other obvious fractures, lucencies, dislocations, or acute abnormalities.    MEDICAL DECISION MAKING (level of service defined by 2/3 elements)     Number/Complexity of Problems Addressed 1 acute, uncomplicated illness or injury (99203/99213)   Amount/Complexity of Data to be Reviewed/Analyzed Independent interpretation of a test performed by another physician/other qualified health care professional (99204/99214)   Risk of Complications/Morbidity/Mortality of Management Over-the-counter Medications (99203/99213)   DME ORDER:  Dx:  ,                   cc:  Artelia Laroche, MD  *Patient note was created using Dragon Dictation sotware. Errors in syntax or grammar may not have been identified and edited on initial review.

## 2023-04-14 NOTE — Unmapped (Signed)
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 Patient presents with daughter in wheelchair and is able to tranfser to infusion chair with the assist of one. Patient denies any recent infection or fever; medications and allergies reviewed and up to date, VSS. Patient on 3 liters of continuous oxygen via [place], transferred to Infusion oxygen and recliner chair. Patient educated regarding medication and common/rare side effects to watch for. Patient given printout of medication information with AVS.    1528 Amvuttra 25 mg administered subcutaneously to left upper arm, bandaid applied.    1604 Patient tolerated post infusion observation with no ill effects, VSS. Patient discharged from Infusion Center in wheelchair with daughter in stable condition with no acute distress.

## 2023-04-14 NOTE — Unmapped (Addendum)
Today,    MEDICATIONS:  We are changing your medications today.  - try the Flexeril 7.5mg  up to twice daily. If it doesn't help, message me.   - START Vitamin A 10,000 units per day (OTC)  Call if you have questions about your medications.    LABS:  We will call you if your labs need attention.    NEXT APPOINTMENT:  Need to call to schedule date/time for first injection of Amvuttra [phone]         In general, to take care of your heart failure:  -Limit your fluid intake to 2 Liters (half-gallon) per day.    -Limit your salt intake to ideally 2-3 grams (2000-3000 mg) per day.  -Weigh yourself daily and record, and bring that "weight diary" to your next appointment.  (Weight gain of 2-3 pounds in 1 day typically means fluid weight.)    The medications for your heart are to help your heart and help you live longer.    Please contact us before stopping any of your heart medications.    Call the clinic at [phone] with questions.  Our clinic fax number is [phone].  If you need to reschedule future appointments, please call [phone] or [phone]  My office number (c/o De Burrs RN) is [phone] if you need further assistance.  After office hours, if you have urgent questions/problems, contact the on-call cardiologist through the hospital operator: [phone].    Please do not send a MyChart message for potentially life-threatening symptoms.  Please call 911 for a true medical emergency.    To learn more about heart failure, please read [facility] "Learning to Live with Heart Failure" - Available online at:  [email or web address] - open the window for Medical Management and click the link "Living with Heart Failure"   (Can search "[facility] heart failure" on the web to find the link.)

## 2023-04-15 MED ORDER — CYCLOBENZAPRINE 5 MG TABLET
ORAL_TABLET | Freq: Two times a day (BID) | ORAL | 1 refills | 30 days | Status: CP | PRN
Start: 2023-04-15 — End: 2024-04-14

## 2023-04-16 MED ORDER — FERROUS SULFATE 325 MG (65 MG IRON) TABLET,DELAYED RELEASE
ORAL_TABLET | ORAL | 3 refills | 84 days | Status: CP
Start: 2023-04-16 — End: 2024-04-15

## 2023-04-20 ENCOUNTER — Ambulatory Visit: Admit: 2023-04-20 | Discharge: 2023-04-21 | Payer: MEDICARE | Attending: Family | Primary: Family

## 2023-04-20 ENCOUNTER — Ambulatory Visit: Admit: 2023-04-20 | Discharge: 2023-04-21 | Payer: MEDICARE

## 2023-04-20 MED ORDER — CEPHALEXIN 500 MG CAPSULE
ORAL_CAPSULE | Freq: Four times a day (QID) | ORAL | 0 refills | 10 days | Status: CP
Start: 2023-04-20 — End: 2023-04-30

## 2023-04-20 MED ORDER — CLINDAMYCIN HCL 150 MG CAPSULE
ORAL_CAPSULE | Freq: Three times a day (TID) | ORAL | 0 refills | 7 days | Status: CN
Start: 2023-04-20 — End: 2023-04-27

## 2023-04-20 NOTE — Unmapped (Signed)
67 121/70 98.1  SPORTS MEDICINE RETURN VISIT    ASSESSMENT AND PLAN      Diagnosis ICD-10-CM Associated Orders   1. Pain in joint of right wrist  M25.531 XR Wrist 3 Or More Views Right      2. Cellulitis, unspecified cellulitis site  L03.90 cephalexin (KEFLEX) 500 MG capsule           Right ulnar-sided hand and wrist cellulitis.  Recommended use of oral antibiotics, prescription for Keflex 500 mg every 6 hours for 10 days provided.  If redness, swelling or pain increases or if she develops any systemic sickness such as fever, chills, nausea or vomiting., I recommended proceeding to the emergency department for IV antibiotics.    Return for Friday.    Procedure(s):  None      SUBJECTIVE     Chief Complaint:   Chief Complaint   Patient presents with    Right Wrist - Pain     Bilateral wrist pain    Left Wrist - Pain       History of Present Illness: 82 y.o. female who presents for bilateral wrist pain.  She has a long history of right wrist pain but notes redness and swelling over the past week.  No specific injury.  No past similar injury.  She had a right TFCC steroid injection October 2023 with good relief.  She has ulnar-sided left wrist pain and has not had prior injection.  Past Medical History:   Past Medical History:   Diagnosis Date    Acute kidney injury superimposed on chronic kidney disease (CMS-HCC) 10/11/2020    Acute on chronic diastolic (congestive) heart failure (CMS-HCC) 08/23/2020    Acute on chronic diastolic congestive heart failure (CMS-HCC)     AKI (acute kidney injury) (CMS-HCC) 04/14/2015    Lab Results  Component  Value  Date     CREATININE  1.90 (H)  06/12/2021     Had a bump in her creatinine when she was taking her diuretics every day.  She is currently taking 40 mg daily of torsemide and 50 mg of spironolactone.  Her volume status is fragile.  Previously when she stopped her diuretic she becomes short of breath.  Plan: We will check her BMP today.  We will likely have to go to 40 Arthritis     Calculus of kidney     Calculus of ureter     CHF (congestive heart failure) (CMS-HCC)     Chronic atrial fibrillation (CMS-HCC) 07/20/2019    COPD (chronic obstructive pulmonary disease) (CMS-HCC)     Diabetes (CMS-HCC)     Gangrenous cholecystitis 10/11/2020    Generalized edema  06/17/2021    GERD (gastroesophageal reflux disease)     Hydronephrosis     Hypertension     Hyponatremia 10/11/2020    Intermediate coronary syndrome (CMS-HCC) 03/13/2014    Lower extremity edema 09/28/2020    Lumbar stenosis     Microscopic hematuria     Nausea alone     Nephrolithiasis 04/17/2016    Neuropathy     Nocturia     Nocturia 07/01/2017    Other chronic cystitis     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  See plans for anemia, AKI  We are also tapering her gabapentin.  Currently on 300 mg 3 times daily.  We will decrease it to twice daily, and then nightly.  She states she is not having any recurrence of  her pain.    Pulmonary hypertension (CMS-HCC)     Renal colic     Shortness of breath 04/28/2020    Sleep apnea     Unstable angina pectoris (CMS-HCC) 03/13/2014         OBJECTIVE     Physical Exam:  Vitals:   Wt Readings from Last 3 Encounters:   04/14/23 95.7 kg (211 lb)   04/14/23 95.7 kg (211 lb)   04/09/23 96.2 kg (212 lb)     Estimated body mass index is 34.06 kg/m?? as calculated from the following:    Height as of 04/14/23: 167.6 cm (5' 6).    Weight as of 04/14/23: 95.7 kg (211 lb).  Gen: Well-appearing female in no acute distress  MSK: Right wrist with erythema and edema about the ulnar aspect of the wrist into the fifth metacarpal of the hand, extending up to the midshaft of the ulna.  Mild tenderness to palpation, worst at the TFCC joint.  Able to move the hand and wrist with only mild pain.  Slight increased warmth noted.  No palpable fluctuance and no visible open wounds.  Left wrist with mild tenderness to palpation about the TFCC.  No instability at the DRUJ with toggle test.    Imaging/other tests: X-rays of the right wrist taken today and reviewed with patient in exam room show:  No acute fracture or malalignment of the right wrist. Diffuse demineralization obscure nondisplaced fractures.      Joint spaces are preserved. Ulnar negative variance. Mild diffuse soft tissue swelling.       @SPORTSPROMIS @      ADMINISTRATIVE     I have personally reviewed and interpreted the images (as available).  Point-of-care ultrasound imaging is on file and stored in a permanent location (if performed).  I have personally reviewed prior records and incorporated relevant information above (as available).    @SMIBILLING @    PROCEDURES     Procedures     DME     DME ORDER:  Dx:  ,

## 2023-04-20 NOTE — Unmapped (Signed)
Plan:Take antibiotic as prescribed. If redness, swelling or pain get worse, please go to emergency department for IV antibiotics.       Thank you for coming to [facility] and our clinic today!     We aim to provide you with the highest quality, individualized care.  If you have any unanswered questions after the visit, please do not hesitate to reach out to [place] on MyChart or leave a message for the nurse.  ?  MyChart messages: These messages can be sent to your provider and will be checked by their clinical support staff.? The messages are checked throughout the day during normal business hours from 8:30 am-4:00 pm Monday-Friday, however responses may take up to 48 hours.? Please use this method of communication for non-urgent and non-emergent concerns, questions, refill requests or inquiries only.? ?Our team will help respond to all of your questions.? Please note that you may be asked to see a provider by either a telehealth or in person visit if it is deemed your questions are best handled in the clinic setting in person.??  ?  Please keep in mind, these messages are not real time communications, so be patient when waiting for a response.    If you do not have access to MyChart, do not know how to use MyChart or have an issue that may require more extensive discussion, please call the nurses' call line: [phone].? This line is checked throughout the day and will be responded to as time allows.? Please note that return calls could take up to 48 hours, depending on the nature of the need.?  ?  If you have an issue that requires emergent attention that cannot wait; either call the Orthopaedics resident on call at [phone], consider coming to our [facility] walk-in clinic, or go to the nearest Emergency Department.    If you need to schedule future appointments, please call [phone].     We look forward to seeing you again in the future and appreciate you choosing [facility] for your care!    Thank you,                We provide innovative and comprehensive patient centered care that is supported by evidence-based research                                                                                                    RESEARCH PARTICIPATION    Please check out our current research studies to see if you or someone you know may qualify at:    [email or web address]

## 2023-04-23 ENCOUNTER — Ambulatory Visit: Admit: 2023-04-23 | Discharge: 2023-04-24 | Payer: MEDICARE | Attending: Family | Primary: Family

## 2023-04-23 MED ORDER — GABAPENTIN 100 MG CAPSULE
ORAL_CAPSULE | 1 refills | 0 days | Status: CP
Start: 2023-04-23 — End: ?

## 2023-04-23 NOTE — Unmapped (Addendum)
Plan:Finish antibiotic as prescribed.  Continue to monitor for worsening redness, swelling or pain.  I have prescribed 100 mg of gabapentin.  You may take 1 of these capsules in addition to your usual dose of gabapentin, totaling 400 mg with each dose.  If you experience any of these, please seek immediate medical attention.      Thank you for coming to [facility] and our clinic today!     We aim to provide you with the highest quality, individualized care.  If you have any unanswered questions after the visit, please do not hesitate to reach out to [place] on MyChart or leave a message for the nurse.  ?  MyChart messages: These messages can be sent to your provider and will be checked by their clinical support staff.? The messages are checked throughout the day during normal business hours from 8:30 am-4:00 pm Monday-Friday, however responses may take up to 48 hours.? Please use this method of communication for non-urgent and non-emergent concerns, questions, refill requests or inquiries only.? ?Our team will help respond to all of your questions.? Please note that you may be asked to see a provider by either a telehealth or in person visit if it is deemed your questions are best handled in the clinic setting in person.??  ?  Please keep in mind, these messages are not real time communications, so be patient when waiting for a response.    If you do not have access to MyChart, do not know how to use MyChart or have an issue that may require more extensive discussion, please call the nurses' call line: [phone].? This line is checked throughout the day and will be responded to as time allows.? Please note that return calls could take up to 48 hours, depending on the nature of the need.?  ?  If you have an issue that requires emergent attention that cannot wait; either call the Orthopaedics resident on call at [phone], consider coming to our [facility] walk-in clinic, or go to the nearest Emergency Department.    If you need to schedule future appointments, please call [phone].     We look forward to seeing you again in the future and appreciate you choosing [facility] for your care!    Thank you,                We provide innovative and comprehensive patient centered care that is supported by evidence-based research                                                                                                    RESEARCH PARTICIPATION    Please check out our current research studies to see if you or someone you know may qualify at:    [email or web address]

## 2023-04-23 NOTE — Unmapped (Signed)
SPORTS MEDICINE RETURN VISIT    ASSESSMENT AND PLAN      Diagnosis ICD-10-CM Associated Orders   1. Cellulitis, unspecified cellulitis site  L03.90       2. Pain in joint of right wrist  M25.531       3. Left wrist pain  M25.532            Improved cellulitis.  Recommended finishing the course of antibiotics previously prescribed and continuing to monitor for symptoms.  Would consider injections into the TFCC for bilateral wrists about 1 week after antibiotics are finished if there are no signs or symptoms of infection. Recommended increasing gabapentin dosing from 300 to 400 mg twice daily as needed for pain..    Return for May 7th or after at [facility].    Procedure(s):  none      SUBJECTIVE     Chief Complaint: No chief complaint on file.      History of Present Illness: 82 y.o. female who presents for follow up of right wrist cellulitis treated with Keflex.  Seen in clinic on 04/20/23 for bilateral wrist pain.  Her pain significantly worsened earlier that week.    Past Medical History:   Past Medical History:   Diagnosis Date    Acute kidney injury superimposed on chronic kidney disease (CMS-HCC) 10/11/2020    Acute on chronic diastolic (congestive) heart failure (CMS-HCC) 08/23/2020    Acute on chronic diastolic congestive heart failure (CMS-HCC)     AKI (acute kidney injury) (CMS-HCC) 04/14/2015    Lab Results  Component  Value  Date     CREATININE  1.90 (H)  06/12/2021     Had a bump in her creatinine when she was taking her diuretics every day.  She is currently taking 40 mg daily of torsemide and 50 mg of spironolactone.  Her volume status is fragile.  Previously when she stopped her diuretic she becomes short of breath.  Plan: We will check her BMP today.  We will likely have to go to 40    Arthritis     Calculus of kidney     Calculus of ureter     CHF (congestive heart failure) (CMS-HCC)     Chronic atrial fibrillation (CMS-HCC) 07/20/2019    COPD (chronic obstructive pulmonary disease) (CMS-HCC)     Diabetes (CMS-HCC)     Gangrenous cholecystitis 10/11/2020    Generalized edema  06/17/2021    GERD (gastroesophageal reflux disease)     Hydronephrosis     Hypertension     Hyponatremia 10/11/2020    Intermediate coronary syndrome (CMS-HCC) 03/13/2014    Lower extremity edema 09/28/2020    Lumbar stenosis     Microscopic hematuria     Nausea alone     Nephrolithiasis 04/17/2016    Neuropathy     Nocturia     Nocturia 07/01/2017    Other chronic cystitis     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  See plans for anemia, AKI  We are also tapering her gabapentin.  Currently on 300 mg 3 times daily.  We will decrease it to twice daily, and then nightly.  She states she is not having any recurrence of her pain.    Pulmonary hypertension (CMS-HCC)     Renal colic     Shortness of breath 04/28/2020    Sleep apnea     Unstable angina pectoris (CMS-HCC) 03/13/2014         OBJECTIVE  Physical Exam:  Vitals:   Wt Readings from Last 3 Encounters:   04/14/23 95.7 kg (211 lb)   04/14/23 95.7 kg (211 lb)   04/09/23 96.2 kg (212 lb)     Estimated body mass index is 34.06 kg/m?? as calculated from the following:    Height as of 04/14/23: 167.6 cm (5' 6).    Weight as of 04/14/23: 95.7 kg (211 lb).  Gen: Well-appearing female in no acute distress  MSK: Right ulnar-sided wrist with mild swelling at the ulnar styloid without any significant warmth or erythema.  Wrist range of motion slightly limited with pain.  Mild diffuse swelling about the right hand.  Radial pulse +2.  Capillary fill is brisk.    Imaging/other tests: No new x-rays taken today.    @SPORTSPROMIS @      ADMINISTRATIVE     I have personally reviewed and interpreted the images (as available).  Point-of-care ultrasound imaging is on file and stored in a permanent location (if performed).  I have personally reviewed prior records and incorporated relevant information above (as available).    @SMIBILLING @    PROCEDURES     Procedures     DME DME ORDER:  Dx:  ,

## 2023-04-28 NOTE — Unmapped (Signed)
[facility] Refill Coordination Note    Specialty Medication(s) to be Shipped:   General Specialty: Vyndamax    Other medication(s) to be shipped: No additional medications requested for fill at this time     Barbara Huber, DOB: 02/04/1941  Phone: [phone] (home)       All above HIPAA information was verified with patient.     Was a Nurse, learning disability used for this call? No    Completed refill call assessment today to schedule patient's medication shipment from the [facility] [phone]).  All relevant notes have been reviewed.     Specialty medication(s) and dose(s) confirmed: Regimen is correct and unchanged.   Changes to medications: Barbara Huber reports no changes at this time.  Changes to insurance: No  New side effects reported not previously addressed with a pharmacist or physician: None reported  Questions for the pharmacist: No    Confirmed patient received a Conservation officer, historic buildings and a Surveyor, mining with first shipment. The patient will receive a drug information handout for each medication shipped and additional FDA Medication Guides as required.       DISEASE/MEDICATION-SPECIFIC INFORMATION        N/A    SPECIALTY MEDICATION ADHERENCE     Medication Adherence    Patient reported X missed doses in the last month: 0  Specialty Medication: Vyndamax 61mg   Informant: patient              Were doses missed due to medication being on hold? No    Vyndamax 61 mg: 10 days of medicine on hand       REFERRAL TO PHARMACIST     Referral to the pharmacist: Not needed      [facility]     Shipping address confirmed in Epic.       Delivery Scheduled: Yes, Expected medication delivery date: 05/05/23.     Medication will be delivered via Next Day Courier to the prescription address in Epic WAM.    Camillo Flaming, PharmD   [facility] Specialty Pharmacist

## 2023-05-03 DIAGNOSIS — R339 Retention of urine, unspecified: Principal | ICD-10-CM

## 2023-05-03 DIAGNOSIS — N390 Urinary tract infection, site not specified: Principal | ICD-10-CM

## 2023-05-03 MED ORDER — FOSFOMYCIN TROMETHAMINE 3 GRAM ORAL PACKET
Freq: Once | ORAL | 0 refills | 1 days | Status: CP
Start: 2023-05-03 — End: 2023-05-03

## 2023-05-04 ENCOUNTER — Ambulatory Visit: Admit: 2023-05-04 | Discharge: 2023-05-05 | Payer: MEDICARE | Attending: Family | Primary: Family

## 2023-05-04 MED FILL — VYNDAMAX 61 MG CAPSULE: ORAL | 30 days supply | Qty: 30 | Fill #4

## 2023-05-06 DIAGNOSIS — N390 Urinary tract infection, site not specified: Principal | ICD-10-CM

## 2023-05-06 DIAGNOSIS — R339 Retention of urine, unspecified: Principal | ICD-10-CM

## 2023-05-06 MED ORDER — FOSFOMYCIN TROMETHAMINE 3 GRAM ORAL PACKET
Freq: Once | ORAL | 0 refills | 1 days
Start: 2023-05-06 — End: 2023-05-06

## 2023-05-06 NOTE — Unmapped (Signed)
Medication Requested: fosfomycin       Future Appointments   Date Time Provider Department Center   06/17/2023  3:45 PM Artelia Laroche, MD [facility]   07/14/2023  1:45 PM [facility]   07/14/2023  2:30 PM Baker, Mamie Laurel, AGNP [facility]     Per Provider Note:  Both daughter and patient would like to maximize her time at home. Given she does not have any side effects from the fosfomycin and a UTI at this point would likely require admission for IV antibiotics, we elected to continue fosfomycin.     Standing order protocol requirements met?: No    Sent to: Provider for signing    Days Supply Given: 0  Number of Refills: 0

## 2023-05-10 MED ORDER — FOSFOMYCIN TROMETHAMINE 3 GRAM ORAL PACKET
Freq: Once | ORAL | 0 refills | 1 days | Status: CP
Start: 2023-05-10 — End: 2023-05-10

## 2023-05-13 NOTE — Unmapped (Signed)
SPORTS MEDICINE RETURN VISIT    ASSESSMENT AND PLAN      Diagnosis ICD-10-CM Associated Orders   1. Pain in joint of right wrist  M25.531 Med Joint Inj: bilateral ulnocarpal      2. Left wrist pain  M25.532 Med Joint Inj: bilateral ulnocarpal      3. Cellulitis, unspecified cellulitis site  L03.90            She would like to proceed with bilateral ultrasound-guided TFCC injections today.  She will monitor for any signs or symptoms of infection such as increasing redness, swelling and increasing pain.  If she notes any of the symptoms, she will seek immediate medical attention.  Cock up wrist braces as needed.    No follow-ups on file.    Procedure(s):  See note below  NEED FOR SONOGRAPHIC GUIDANCE    Given the complexity of this problem, the anatomic location of this structure, sonographic guidance is recommended to prevent injury to neurovascular structures and confirm accuracy of injection. The accuracy of doing these injections blind is poor and the benefit to the patient by using ultrasound guidance is significant to avoid complications.     Reference:  American Medical Society for Sports Medicine (AMSSM) position statement: interventional musculoskeletal ultrasound in sports medicine.  Morey Hummingbird MM, Adams E, Berkoff D, Concoff AL, Jiles Crocker J Sports Med. 2014 Oct 20. pii: [id number]. doi: 10.1136/[id number]      SUBJECTIVE     Chief Complaint:   Chief Complaint   Patient presents with    Right Wrist - Pain    Left Wrist - Pain       History of Present Illness: 82 y.o. female who presents for follow-up of right wrist and forearm cellulitis.  She was treated with Keflex and had near resolution in her last visit.  She had initially been seen requesting bilateral TFCC injections which have helped with her previous wrist pain.  She was noted incidentally to have cellulitis which required treatment first.  She reports her wrist feels better but she still has pain localized to the ulnar carpal joint.  She  completed her antibiotic course last week and she would like to proceed with bilateral injections.    Past Medical History:   Past Medical History:   Diagnosis Date    Acute kidney injury superimposed on chronic kidney disease (CMS-HCC) 10/11/2020    Acute on chronic diastolic (congestive) heart failure (CMS-HCC) 08/23/2020    Acute on chronic diastolic congestive heart failure (CMS-HCC)     AKI (acute kidney injury) (CMS-HCC) 04/14/2015    Lab Results  Component  Value  Date     CREATININE  1.90 (H)  06/12/2021     Had a bump in her creatinine when she was taking her diuretics every day.  She is currently taking 40 mg daily of torsemide and 50 mg of spironolactone.  Her volume status is fragile.  Previously when she stopped her diuretic she becomes short of breath.  Plan: We will check her BMP today.  We will likely have to go to 40    Arthritis     Calculus of kidney     Calculus of ureter     CHF (congestive heart failure) (CMS-HCC)     Chronic atrial fibrillation (CMS-HCC) 07/20/2019    COPD (chronic obstructive pulmonary disease) (CMS-HCC)     Diabetes (CMS-HCC)     Gangrenous cholecystitis 10/11/2020    Generalized edema  06/17/2021  GERD (gastroesophageal reflux disease)     Hydronephrosis     Hypertension     Hyponatremia 10/11/2020    Intermediate coronary syndrome (CMS-HCC) 03/13/2014    Lower extremity edema 09/28/2020    Lumbar stenosis     Microscopic hematuria     Nausea alone     Nephrolithiasis 04/17/2016    Neuropathy     Nocturia     Nocturia 07/01/2017    Other chronic cystitis     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  See plans for anemia, AKI  We are also tapering her gabapentin.  Currently on 300 mg 3 times daily.  We will decrease it to twice daily, and then nightly.  She states she is not having any recurrence of her pain.    Pulmonary hypertension (CMS-HCC)     Renal colic     Shortness of breath 04/28/2020    Sleep apnea Unstable angina pectoris (CMS-HCC) 03/13/2014         OBJECTIVE     Physical Exam:  Vitals:   Wt Readings from Last 3 Encounters:   04/14/23 95.7 kg (211 lb)   04/14/23 95.7 kg (211 lb)   04/09/23 96.2 kg (212 lb)     Estimated body mass index is 34.06 kg/m?? as calculated from the following:    Height as of 04/14/23: 167.6 cm (5' 6).    Weight as of 04/14/23: 95.7 kg (211 lb).  Gen: Well-appearing female in no acute distress  MSK: Right wrist with mild ulnar carpal enlargement.  No redness, swelling or warmth noted on the right.  Bilateral TFCC with pain.  No obvious TFCC laxity.  Radial pulses +2.  Capillary refills less than 3 seconds.    Imaging/other tests: No new x-rays taken today.    @SPORTSPROMIS @      ADMINISTRATIVE     I have personally reviewed and interpreted the images (as available).  Point-of-care ultrasound imaging is on file and stored in a permanent location (if performed).  I have personally reviewed prior records and incorporated relevant information above (as available).    @SMIBILLING @    PROCEDURES     Med Joint Inj: bilateral ulnocarpal on 05/04/2023 3:40 PM  Indications: pain  Details: 22 G needle, ultrasound-guided dorsal approach  Laterality: bilateral  Location: wrist  Medications (Right): 10 mg triamcinolone acetonide 10 mg/mL  Medications (Left): 10 mg triamcinolone acetonide 10 mg/mL  Outcome: tolerated well, no immediate complications  Procedure, treatment alternatives, risks and benefits explained, specific risks discussed. Consent was given by the patient. Immediately prior to procedure a time out was called to verify the correct patient, procedure, equipment, support staff and site/side marked as required. Patient was prepped and draped in the usual sterile fashion.     Medical Care Team Attestation: All ProcDoc orders were read back and verbally confirmed with the procedure provider, including but not limited to patient name, medication name, dose, and route, before any actions were taken.  Provider Attestation: The information documented by members of my medical care team was reviewed and verified for accuracy by me.           DME     DME ORDER:  Dx:  ,

## 2023-05-14 DIAGNOSIS — M8000XD Age-related osteoporosis with current pathological fracture, unspecified site, subsequent encounter for fracture with routine healing: Principal | ICD-10-CM

## 2023-05-14 DIAGNOSIS — B009 Herpesviral infection, unspecified: Principal | ICD-10-CM

## 2023-05-14 MED ORDER — VALACYCLOVIR 500 MG TABLET
ORAL_TABLET | Freq: Two times a day (BID) | ORAL | 0 refills | 5 days | Status: CP
Start: 2023-05-14 — End: 2023-05-19

## 2023-05-17 NOTE — Unmapped (Addendum)
Follow up call:  Spoke  with daughter. They have a follow-up appt to speak to the doctor.  --------------------------------------------------------------------------------      ----- Message from Chriss Driver, MD sent at 05/17/2023 12:54 AM EDT -----  Regarding: RE: fosfomycin  I've tried to refill this medication twice. Its her insurance that was refusing it. Its not a necessary medication we can trial her off of it but she should come in to talk about risk/benefits.   ----- Message -----  From: Georgina Peer, RN  Sent: 05/11/2023   3:29 PM EDT  To: Chriss Driver, MD  Subject: fosfomycin                                       So she does not need to take this med? Correct  ----- Message -----  From: Cassie Freer  Sent: 05/11/2023   3:25 PM EDT  To: #    Incoming Call  Caller: Daughter Mardi Mainland  Markus Jarvis  Best callback number:[phone]       She wanted to see if her mom is supposed to continue taking   fosfomycin (MONUROL) , or is she not supposed to get anymore refills?      Thank you!

## 2023-05-20 MED ORDER — CYCLOBENZAPRINE 5 MG TABLET
ORAL_TABLET | Freq: Two times a day (BID) | ORAL | 1 refills | 30 days | Status: CP | PRN
Start: 2023-05-20 — End: 2024-05-19

## 2023-05-20 NOTE — Unmapped (Signed)
Refill request received for patient.      Medication Requested: cyclobenzaprine  Last Office Visit: 04/14/2023   Next Office Visit: 07/14/2023  Last Prescriber: Derwood Kaplan    Nurse refill requirements met? Yes  If not met, why: n/a    Sent to: Provider for signing  If sent to provider, which provider?: Derwood Kaplan

## 2023-05-27 NOTE — Unmapped (Signed)
[facility] Refill Coordination Note    Specialty Medication(s) to be Shipped:   General Specialty: Vyndamax    Other medication(s) to be shipped: No additional medications requested for fill at this time     Barbara [facility]er, DOB: 10-May-1941  Phone: [phone] (home)       All above HIPAA information was verified with patient.     Was a Nurse, learning disability used for this call? No    Completed refill call assessment today to schedule patient's medication shipment from the [facility] [phone]).  All relevant notes have been reviewed.     Specialty medication(s) and dose(s) confirmed: Regimen is correct and unchanged.   Changes to medications: [facility] reports no changes at this time.  Changes to insurance: No  New side effects reported not previously addressed with a pharmacist or physician: None reported  Questions for the pharmacist: No    Confirmed patient received a Conservation officer, historic buildings and a Surveyor, mining with first shipment. The patient will receive a drug information handout for each medication shipped and additional FDA Medication Guides as required.       DISEASE/MEDICATION-SPECIFIC INFORMATION        N/A    SPECIALTY MEDICATION ADHERENCE     Medication Adherence    Patient reported X missed doses in the last month: 0  Specialty Medication: Vyndamax 61mg   Informant: patient              Were doses missed due to medication being on hold? No    Vyndamax 61 mg: ~9 days of medicine on hand       REFERRAL TO PHARMACIST     Referral to the pharmacist: Not needed      [facility]     Shipping address confirmed in Epic.       Delivery Scheduled: Yes, Expected medication delivery date: 06/02/23.     Medication will be delivered via Next Day Courier to the prescription address in Epic WAM.    Barbara [facility]er, PharmD   [facility] Specialty Pharmacist

## 2023-05-29 MED ORDER — FLUTICASONE PROPIONATE 115 MCG-SALMETEROL 21 MCG/ACTUATION HFA INHALER
Freq: Two times a day (BID) | RESPIRATORY_TRACT | 11 refills | 30 days
Start: 2023-05-29 — End: 2024-05-28

## 2023-05-31 MED ORDER — FLUTICASONE PROPIONATE 115 MCG-SALMETEROL 21 MCG/ACTUATION HFA INHALER
Freq: Two times a day (BID) | RESPIRATORY_TRACT | 11 refills | 30 days | Status: CP
Start: 2023-05-31 — End: 2024-05-30

## 2023-05-31 NOTE — Unmapped (Signed)
The pharmacist called and requested clarification on the gabapentin.  Is she on both 300mg  and 100mg ?  Requests clarification.

## 2023-06-01 MED FILL — VYNDAMAX 61 MG CAPSULE: ORAL | 30 days supply | Qty: 30 | Fill #5

## 2023-06-04 MED ORDER — GABAPENTIN 400 MG CAPSULE
ORAL_CAPSULE | Freq: Three times a day (TID) | ORAL | 3 refills | 90 days | Status: CP
Start: 2023-06-04 — End: ?

## 2023-06-04 NOTE — Unmapped (Signed)
Addended by: Oran Rein on: 06/04/2023 09:01 AM     Modules accepted: Orders

## 2023-06-17 ENCOUNTER — Telehealth: Admit: 2023-06-17 | Discharge: 2023-06-18 | Payer: MEDICARE

## 2023-06-17 DIAGNOSIS — Z794 Long term (current) use of insulin: Principal | ICD-10-CM

## 2023-06-17 DIAGNOSIS — E1122 Type 2 diabetes mellitus with diabetic chronic kidney disease: Principal | ICD-10-CM

## 2023-06-17 DIAGNOSIS — M8000XD Age-related osteoporosis with current pathological fracture, unspecified site, subsequent encounter for fracture with routine healing: Principal | ICD-10-CM

## 2023-06-17 DIAGNOSIS — I5032 Chronic diastolic (congestive) heart failure: Principal | ICD-10-CM

## 2023-06-17 DIAGNOSIS — I43 Cardiomyopathy in diseases classified elsewhere: Principal | ICD-10-CM

## 2023-06-17 DIAGNOSIS — I4819 Other persistent atrial fibrillation: Principal | ICD-10-CM

## 2023-06-17 DIAGNOSIS — E559 Vitamin D deficiency, unspecified: Principal | ICD-10-CM

## 2023-06-17 DIAGNOSIS — I10 Essential (primary) hypertension: Principal | ICD-10-CM

## 2023-06-17 DIAGNOSIS — J42 Unspecified chronic bronchitis: Principal | ICD-10-CM

## 2023-06-17 DIAGNOSIS — G629 Polyneuropathy, unspecified: Principal | ICD-10-CM

## 2023-06-17 DIAGNOSIS — E854 Organ-limited amyloidosis: Principal | ICD-10-CM

## 2023-06-17 DIAGNOSIS — N184 Chronic kidney disease, stage 4 (severe): Principal | ICD-10-CM

## 2023-06-17 MED ORDER — GABAPENTIN 300 MG CAPSULE
ORAL_CAPSULE | Freq: Three times a day (TID) | ORAL | 3 refills | 60.00000 days | Status: CP
Start: 2023-06-17 — End: 2023-06-17

## 2023-06-17 NOTE — Unmapped (Signed)
Please call Radiology to schedule your imaging test if you have not heard from them.  The number is [phone].  You can request any location in [place], [place], or any other [place] location closest to you.  The [facility] where our office is located does offere different types of imaging tests, but not all imaging tests are done here.

## 2023-06-17 NOTE — Unmapped (Signed)
Internal Medicine Clinic Visit  Person Contacted: Patient  Contact Phone number: [phone] (home)   Is there someone else in the room? Yes. What is your relationship? daughter. Do you want this person here for the visit? yes.  Reason for visit: follow up    A/P:    Essential hypertension  Overview:  History of hypertension, now stable.   On spirinolactone 25 mg every day.Torsemide-40 mg qd    BP 131/76     Plan: continue treatment-      Cardiac amyloidosis (CMS-HCC)  Overview:  Diagnosed 10/2020 on NM Spect. Followed at [facility] cardiology  On tafamidis and amvuttra    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)  Overview:  Secondary to TTR amyloid. Followed by Dr Emi Holes  Diuresis can be complicated by worsening of her [name], but currently stable     Euvolemic by symptoms  Plan:   Cont current meds  Follows up with [facility] cardiology       Type 2 diabetes mellitus with stage 4 chronic kidney disease, with long-term current use of insulin (CMS-HCC)  Overview:  Lab Results   Component Value Date    A1C 5.8 (H) 04/14/2023    A1C 5.7 (H) 10/29/2022    A1C 5.6 03/13/2022     Diabetes, on lantus 4 unit qhs at bedtime. Due to low GFR, off other meds. Did not tolerate jardiance, due to UTI  BS s at home in range, we will watch for hypoglycemia  Plan: cont. Same dose  Check A1C around October  Vitamin D deficiency  - On weekly vitamin D      Vitamin D 25 Hydroxy (25OH D2 + D3); Future    Neuropathy  -  Clarified dose:   gabapentin 300 mg bid    Chronic kidney disease ([name]), stage IV (severe) (CMS-HCC)  Overview:  Lab Results   Component Value Date    CREATININE 2.48 (H) 04/14/2023     Creatinine stable  Anticipate maintaining current dose of torsemide 40 mg every a.m., 20 mg q pm. Also on spirinolactone.     Follows with [facility] nephrology    Persistent atrial fibrillation (CMS-HCC)  Overview:  On amiodarone 200 mg every day,  EKG: NSR, 04/2022    Off apixaban, due to downtrending Hb  Plan: continue treatment    Age-related osteoporosis with current pathological fracture with routine healing, subsequent encounter  Overview:  01/2023 pelvic fx, non displaced from standing  Xray-osteopenia  Imp: osteoporosis  Plan: replete vit d-50,000 units weekly  Order dexa  Endo consult-pending  Plan: reinforced idea of bone dexa  Await endo consult  Recheck Vit D with next check    Lab Results   Component Value Date    VITDTOTAL 7.1 (L) 02/16/2023       Return in about 3 months (around 09/17/2023).    HPI:   Pt is a 82 y.o. female with a history of COPD on 3L home , CHF, [name] secondary to amyloid, [name], T2DM, osteoporosis based on fragility fracture.   Doing great. Except for arthritis.   Breathing is good.    Pelvis is back to normal.   Urinating okay.   Taking torsemide-60 total in day  Blood sugars-have been great.   Is taking Vitamin D.   Blood pressure: 131/76  Lab Results   Component Value Date    CREATININE 2.48 (H) 04/14/2023       Problem List:  Patient Active Problem List   Diagnosis  Spinal stenosis of lumbar region    Thoracic or lumbosacral neuritis or radiculitis    Lumbosacral spondylosis    COPD (chronic obstructive pulmonary disease) (CMS-HCC)    Sleep apnea in adult    Essential hypertension    Type 2 diabetes mellitus with stage 4 chronic kidney disease, with long-term current use of insulin (CMS-HCC)    Chronic cystitis    Incomplete bladder emptying    Peripheral neuropathy    Chronic respiratory failure with hypoxia (CMS-HCC)    Persistent atrial fibrillation (CMS-HCC)    Anemia in chronic kidney disease    Chronic kidney disease ([name]), stage IV (severe) (CMS-HCC)    Pulmonary hypertension (CMS-HCC)    Chronic prescription opiate use    Dyspnea on exertion    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)    Cardiac amyloidosis (CMS-HCC)    Recurrent cold sores    Dyspepsia    Iron deficiency anemia due to chronic blood loss    Neuropathy    Left hip pain    Familial amyloid heart disease (CMS-HCC)    Hereditary amyloidosis (CMS-HCC)    Osteoporosis with current pathological fracture       Medications:  Reviewed in EPIC    Social history:   Lives with daughter Mardi Mainland    Physical Exam:   Vital Signs:  BP Readings from Last 3 Encounters:   04/14/23 (P) 133/68   04/14/23 124/72   04/09/23 100/57      Wt Readings from Last 3 Encounters:   06/17/23 95.3 kg (210 lb)   04/14/23 95.7 kg (211 lb)   04/14/23 95.7 kg (211 lb)      General appearance -   Looks well, wearing oxygen  No dyspnea    Records review  Lab Results   Component Value Date    A1C 5.8 (H) 04/14/2023        Health Maintenance Due   Topic Date Due    DEXA Scan  Never done    Retinal Eye Exam  Never done    DTaP/Tdap/Td Vaccines (1 - Tdap) Never done    Medicare Annual Wellness Visit (AWV)  11/25/2018    Urine Albumin/Creatinine Ratio  12/12/2021    Foot Exam  02/04/2022    COVID-19 Vaccine (5 - 2023-24 season) 08/28/2022      The ASCVD Risk score (Arnett DK, et al., 2019) failed to calculate.     Medication adherence and barriers to the treatment plan have been addressed. Opportunities to optimize healthy behaviors have been discussed. Patient / caregiver voiced understanding.      The patient reports they are physically located in [place] and is currently: at home. I conducted a audio/video visit. I spent  25m 26s on the video call with the patient. I spent an additional 15 minutes on pre- and post-visit activities on the date of service .

## 2023-06-17 NOTE — Unmapped (Signed)
[facility] at [facility]     Type of visit: face to face    Are you located in [place]? (for virtual visits only)     Reason for visit: Follow up    Questions / Concerns that need to be addressed:         PTHomeBP     Diabetes:  Regularly checking blood sugars?: yes  If yes, when? Complete log for past 7 days  Date Before Breakfast After Breakfast Before Lunch After Lunch Before Dinner After Dinner Before Bed    06/17/2023  97                                                                                                                             HCDM reviewed and updated in Epic:    We are working to make sure all of our patients??? wishes are updated in Epic and part of that is documenting a Environmental health practitioner for each patient  A Health Care Decision Maker is someone you choose who can make health care decisions for you if you are not able - who would you most want to do this for you????  is already up to date.    HCDM (patient stated preference): [name],[name] - Daughter - [phone]    BPAs completed:      COVID-19 Vaccine Summary  Which COVID-19 Vaccine was administered  Pfizer  Type:  Dates Given:                   If no: Are you interested in scheduling?     Immunization History   Administered Date(s) Administered    COVID-19 VAC,BIVALENT(39YR UP),PFIZER 11/14/2021    COVID-19 VACC,MRNA,(PFIZER)(PF) 01/19/2020, 02/09/2020, 12/12/2020    INFLUENZA QUAD ADJUVANTED 71YR UP(FLUAD) 09/17/2021    INFLUENZA QUAD HIGH DOSE 71YRS+(FLUZONE) 09/28/2016    Influenza Vaccine Quad(IM)6 MO-Adult(PF) 10/16/2015, 09/08/2019, 08/30/2020    Influenza Virus Vaccine, unspecified formulation 09/27/2014, 09/23/2017, 09/21/2018    PNEUMOCOCCAL POLYSACCHARIDE 23-VALENT 09/28/2016    Pneumococcal Conjugate 13-Valent 08/22/2019    SHINGRIX-ZOSTER VACCINE (HZV),RECOMBINANT,ADJUVANTED(IM) 04/04/2021, 09/17/2021 __________________________________________________________________________________________    SCREENINGS COMPLETED IN FLOWSHEETS    HARK Screening       AUDIT       PHQ2       PHQ9          P4 Suicidality Screener                GAD7       COPD Assessment       Falls Risk

## 2023-06-22 ENCOUNTER — Ambulatory Visit: Admit: 2023-06-22 | Discharge: 2023-06-23 | Payer: MEDICARE | Attending: Family | Primary: Family

## 2023-06-22 NOTE — Unmapped (Signed)
SPORTS MEDICINE RETURN VISIT    ASSESSMENT AND PLAN      Diagnosis ICD-10-CM Associated Orders   1. Left wrist pain  M25.532       2. De Quervain's tenosynovitis, left  M65.4            She is interested in steroid injection into the first dorsal compartment on the left wrist.  She may wear wrist brace as needed if it seems to be helping with her symptoms.  Advance activity as tolerated with limitation for pain.    Return if symptoms worsen or fail to improve.    Procedure(s):  Steroid injection the left first dorsal compartment.      SUBJECTIVE     Chief Complaint: No chief complaint on file.      History of Present Illness: 82 y.o. female who presents for left wrist pain over the past week or so.  She has been wearing a cock-up wrist brace with no significant improvement.  Most of the pain is localized to the radial styloid.  No injury.    Past Medical History:   Past Medical History:   Diagnosis Date    Acute kidney injury superimposed on chronic kidney disease (CMS-HCC) 10/11/2020    Acute on chronic diastolic (congestive) heart failure (CMS-HCC) 08/23/2020    Acute on chronic diastolic congestive heart failure (CMS-HCC)     AKI (acute kidney injury) (CMS-HCC) 04/14/2015    Lab Results  Component  Value  Date     CREATININE  1.90 (H)  06/12/2021     Had a bump in her creatinine when she was taking her diuretics every day.  She is currently taking 40 mg daily of torsemide and 50 mg of spironolactone.  Her volume status is fragile.  Previously when she stopped her diuretic she becomes short of breath.  Plan: We will check her BMP today.  We will likely have to go to 40    Anemia 08/22/2019    Iron deficiency anemia          Lab Results      Component    Value    Date           WBC    9.2    03/21/2021           RBC    3.67 (L)    03/21/2021           HGB    9.9 (L)    03/21/2021           HCT    30.7 (L)    03/21/2021           MCV    83.7    03/21/2021           MCH    26.9    03/21/2021           MCHC    32.1 03/21/2021           RDW    21.9 (H)    03/21/2021           PLT    318     Arthritis     Calculus of kidney     Calculus of ureter     CHF (congestive heart failure) (CMS-HCC)     Chronic atrial fibrillation (CMS-HCC) 07/20/2019    COPD (chronic obstructive pulmonary disease) (CMS-HCC)     Diabetes (CMS-HCC)     Gangrenous cholecystitis 10/11/2020    Generalized  edema  06/17/2021    GERD (gastroesophageal reflux disease)     Hydronephrosis     Hypertension     Hyponatremia 10/11/2020    Intermediate coronary syndrome (CMS-HCC) 03/13/2014    Lower extremity edema 09/28/2020    Lumbar stenosis     Microscopic hematuria     Nausea alone     Nephrolithiasis 04/17/2016    Neuropathy     Nocturia     Nocturia 07/01/2017    Other chronic cystitis     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  See plans for anemia, AKI  We are also tapering her gabapentin.  Currently on 300 mg 3 times daily.  We will decrease it to twice daily, and then nightly.  She states she is not having any recurrence of her pain.    Pulmonary hypertension (CMS-HCC)     Renal colic     Shortness of breath 04/28/2020    Sleep apnea     Unstable angina pectoris (CMS-HCC) 03/13/2014         OBJECTIVE     Physical Exam:  Vitals:   Wt Readings from Last 3 Encounters:   06/17/23 95.3 kg (210 lb)   04/14/23 95.7 kg (211 lb)   04/14/23 95.7 kg (211 lb)     Estimated body mass index is 33.89 kg/m?? as calculated from the following:    Height as of 06/17/23: 167.6 cm (5' 6).    Weight as of 06/17/23: 95.3 kg (210 lb).  Gen: Well-appearing female in no acute distress  MSK: Left wrist with palpable bony enlargement of the dorsal radial carpus without any tenderness palpation.  Tenderness palpation about left radial styloid.  Positive Finkelstein's.  Balance of the hand with diffuse IP joint enlargement.  Capillary refills brisk.  Skin is warm, dry and intact without excessive warmth, erythema or ecchymosis.    Imaging/other tests: No new x-rays taken today.    @SPORTSPROMIS @      ADMINISTRATIVE     I have personally reviewed and interpreted the images (as available).  Point-of-care ultrasound imaging is on file and stored in a permanent location (if performed).  I have personally reviewed prior records and incorporated relevant information above (as available).    @SMIBILLING @    PROCEDURES     Tendon Shealth Injection: left first dorsal extensor on 06/22/2023 1:20 PM  Indications: pain  Details: 22 G needle, lateral approach  Laterality: left    Medications: 10 mg triamcinolone acetonide 10 mg/mL  Outcome: tolerated well, no immediate complications  Procedure, treatment alternatives, risks and benefits explained, specific risks discussed. Consent was given by the patient. Immediately prior to procedure a time out was called to verify the correct patient, procedure, equipment, support staff and site/side marked as required. Patient was prepped and draped in the usual sterile fashion.     Medical Care Team Attestation: All ProcDoc orders were read back and verbally confirmed with the procedure provider, including but not limited to patient name, medication name, dose, and route, before any actions were taken.  Provider Attestation: The information documented by members of my medical care team was reviewed and verified for accuracy by me.           DME     DME ORDER:  Dx:  ,

## 2023-06-22 NOTE — Unmapped (Signed)
[facility] Clinical Assessment & Refill Coordination Note    Barbara Huber, DOB: December 22, 1941  Phone: [phone] (home)     All above HIPAA information was verified with patient.     Was a Nurse, learning disability used for this call? No    Specialty Medication(s):   General Specialty: Vyndamax     Current Outpatient Medications   Medication Sig Dispense Refill    acetaminophen (TYLENOL) 500 MG tablet Take 2 tablets (1,000 mg total) by mouth Three (3) times a day.      albuterol HFA 90 mcg/actuation inhaler Inhale 2 puffs every eight (8) hours as needed for wheezing.      amiodarone (PACERONE) 200 MG tablet TAKE ONE TABLET BY MOUTH ONCE DAILY 90 tablet 3    blood sugar diagnostic (ACCU-CHEK GUIDE TEST STRIPS) Strp Use to check blood sugar daily as instructed by clinic. Dx E11.9 For daily testing 100 strip 3    blood-glucose meter kit Use as instructed Daily testing 1 each 0    cranberry 500 mg cap Take 500 mg by mouth daily with evening meal.      cyclobenzaprine (FLEXERIL) 5 MG tablet Take 1.5 tablets (7.5 mg total) by mouth two (2) times a day as needed for muscle spasms. 90 tablet 1    ergocalciferol-1,250 mcg, 50,000 unit, (DRISDOL) 1,250 mcg (50,000 unit) capsule Take 1 capsule (1,250 mcg total) by mouth once a week. 12 capsule 3    estradioL (ESTRACE) 0.01 % (0.1 mg/gram) vaginal cream Insert 2 g into the vagina Two (2) times a week. 42.5 g 3    ferrous sulfate 325 (65 FE) MG EC tablet Take 1 tablet (325 mg total) by mouth Every Monday, Wednesday, and Friday. 36 tablet 3    fluticasone propion-salmeterol (ADVAIR HFA) 115-21 mcg/actuation inhaler Inhale 2 puffs two (2) times a day. 12 g 11    fosfomycin (MONUROL) 3 gram Pack       gabapentin (NEURONTIN) 300 MG capsule Take 1 capsule (300 mg total) by mouth two (2) times a day. 180 capsule 3    insulin glargine (BASAGLAR, LANTUS) 100 unit/mL (3 mL) injection pen Inject 0.04 mL (4 Units total) under the skin nightly.      lancets Misc 1 each by Miscellaneous route daily. Accu Check 100 each 6    metOLazone (ZAROXOLYN) 5 MG tablet Take 1 tablet (5 mg total) by mouth daily as needed (when instructed by cardiology clinic).      montelukast (SINGULAIR) 10 mg tablet Take 1 tablet (10 mg total) by mouth every morning. 90 tablet 2    NARCAN 4 mg/actuation nasal spray 1 spray into alternating nostrils once as needed (opioid overdose). PRN - Emergency use.      NON FORMULARY APPLY FROM NECK DOWN TWICE A DAY      OXYCONTIN 10 mg TR12 12 hr crush resistant ER/CR tablet Take 1 tablet (10 mg total) by mouth every twelve (12) hours.      OXYGEN-AIR DELIVERY SYSTEMS MISC 5 L by Miscellaneous route. Currently using 3  L/min via Letts      pantoprazole (PROTONIX) 20 MG tablet TAKE 1 TABLET(20 MG) BY MOUTH DAILY 90 tablet 3    pen needle, diabetic (BD ULTRA-FINE SHORT PEN NEEDLE) 31 gauge x 5/16 (8 mm) Ndle Give 4 units of insulin each night 100 each 1    rosuvastatin (CRESTOR) 5 MG tablet Take 1 tablet (5 mg total) by mouth every other day. 15 tablet 11  spironolactone (ALDACTONE) 25 MG tablet TAKE 1 TABLET(25 MG) BY MOUTH DAILY 90 tablet 3    tafamidis (VYNDAMAX) 61 mg cap Take 1 capsule (61 mg) by mouth daily. 30 capsule 11    tiotropium bromide (SPIRIVA RESPIMAT) 2.5 mcg/actuation inhalation mist Inhale 2 puffs daily. 4 g 11    torsemide (DEMADEX) 20 MG tablet Take 3 tablets (60 mg total) by mouth daily. 270 tablet 3    vitamin A-3,000 mcg RAE, 10,000 UNIT, 3,000 mcg RAE (10,000 UNIT) capsule Take 1 capsule (3,000 mcg of RAE total) by mouth daily.      vutrisiran (AMVUTTRA) 25 mg/0.5 mL injection Inject 0.5 mL (25 mg total) under the skin once. Every 12 Weeks       No current facility-administered medications for this visit.        Changes to medications: Barbara Huber reports no changes at this time.    Allergies   Allergen Reactions    Nitrofurantoin Anaphylaxis and Other (See Comments)    Lipitor [Atorvastatin] Muscle Pain     SOB, Headache, fatigue, sick on my stomach Changes to allergies: No    SPECIALTY MEDICATION ADHERENCE     Vyndamax 61 mg: 10 days of medicine on hand        Medication Adherence    Specialty Medication: Vyndamax 61mg   Informant: patient          Specialty medication(s) dose(s) confirmed: Regimen is correct and unchanged.     Are there any concerns with adherence? No    Adherence counseling provided? Not needed    CLINICAL MANAGEMENT AND INTERVENTION      Clinical Benefit Assessment:    Do you feel the medicine is effective or helping your condition? Yes    Clinical Benefit counseling provided? Not needed    Adverse Effects Assessment:    Are you experiencing any side effects? No    Are you experiencing difficulty administering your medicine? 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 For example, brushing your teeth or getting up in the morning. Patient declined to answer    Have you discussed this with your provider? Not needed    Acute Infection Status:    Acute infections noted within Epic:  No active infections  Patient reported infection: None    Therapy Appropriateness:    Is therapy appropriate and patient progressing towards therapeutic goals? Yes, therapy is appropriate and should be continued    DISEASE/MEDICATION-SPECIFIC INFORMATION      N/A    Cardiology: Not Applicable    PATIENT SPECIFIC NEEDS     Does the patient have any physical, cognitive, or cultural barriers? No    Is the patient high risk? No    Did the patient require a clinical intervention? No    Does the patient require physician intervention or other additional services (i.e., nutrition, smoking cessation, social work)? No    SOCIAL DETERMINANTS OF HEALTH     At the [facility], we have learned that life circumstances - like trouble affording food, housing, utilities, or transportation can affect the health of many of our patients.   That is why we wanted to ask: are you currently experiencing any life circumstances that are negatively impacting your health and/or quality  of life? Patient declined to answer    Social Determinants of Health     Financial Resource Strain: Low Risk  (02/16/2023)    Overall Financial Resource Strain (CARDIA)     Difficulty of Paying Living Expenses: Not hard at all   Internet Connectivity: Not on file   Food Insecurity: No Food Insecurity (02/16/2023)    Hunger Vital Sign     Worried About Running Out of Food in the Last Year: Never true     Ran Out of Food in the Last Year: Never true   Tobacco Use: Medium Risk (06/17/2023)    Patient History     Smoking Tobacco Use: Former     Smokeless Tobacco Use: Never     Passive Exposure: Not on file   Housing/Utilities: Low Risk  (02/16/2023)    Housing/Utilities     Within the past 12 months, have you ever stayed: outside, in a car, in a tent, in an overnight shelter, or temporarily in someone else's home (i.e. couch-surfing)?: No     Are you worried about losing your housing?: No     Within the past 12 months, have you been unable to get utilities (heat, electricity) when it was really needed?: No   Alcohol Use: Not At Risk (03/18/2023)    Alcohol Use     How often do you have a drink containing alcohol?: Never     How many drinks containing alcohol do you have on a typical day when you are drinking?: 1 - 2     How often do you have 5 or more drinks on one occasion?: Never   Transportation Needs: No Transportation Needs (02/16/2023)    PRAPARE - Transportation     Lack of Transportation (Medical): No     Lack of Transportation (Non-Medical): No   Substance Use: Low Risk  (06/17/2023)    Substance Use     Taken prescription drugs for non-medical reasons: Never     Taken illegal drugs: Never     Patient indicated they have taken drugs in the past year for non-medical reasons: Yes, [positive answer(s)]: Not on file   Health Literacy: Medium Risk (05/02/2021)    Health Literacy     : Rarely   Physical Activity: Unknown (11/04/2017)    Received from [facility], [facility]    Exercise Vital Sign     Days of Exercise per Week: Patient declined     Minutes of Exercise per Session: Patient declined   Interpersonal Safety: Not on file   Stress: Unknown (11/04/2017)    Received from [facility], [facility]    [employer] of Occupational Health - Occupational Stress Questionnaire     Feeling of Stress : Patient declined   Intimate Partner Violence: Not At Risk (12/17/2021)    Humiliation, Afraid, Rape, and Kick questionnaire     Fear of Current or Ex-Partner: No     Emotionally Abused: No     Physically Abused: No     Sexually Abused: No   Depression: Not at risk (03/18/2023)    PHQ-2     PHQ-2 Score: 0   Social Connections: Unknown (11/04/2017)    Received from [facility], [facility]    Social Connection and Isolation Panel [NHANES]     Frequency of Communication with Friends and Family: Patient declined     Frequency of Social Gatherings with Friends and Family: Patient  declined     Attends Religious Services: Patient declined     Active Member of Clubs or Organizations: Patient declined     Attends Banker Meetings: Patient declined     Marital Status: Patient declined       Would you be willing to receive help with any of the needs that you have identified today? Not applicable       SHIPPING     Specialty Medication(s) to be Shipped:   General Specialty: Vyndamax    Other medication(s) to be shipped: No additional medications requested for fill at this time     Changes to insurance: No    Delivery Scheduled: Yes, Expected medication delivery date: 06/29/23.     Medication will be delivered via Next Day Courier to the confirmed prescription address in [facility].    The patient will receive a drug information handout for each medication shipped and additional FDA Medication Guides as required.  Verified that patient has previously received a Conservation officer, historic buildings and a Surveyor, mining.    The patient or caregiver noted above participated in the development of this care plan and knows that they can request review of or adjustments to the care plan at any time.      All of the patient's questions and concerns have been addressed.    Camillo Flaming, PharmD   [facility] Specialty Pharmacist

## 2023-06-22 NOTE — Unmapped (Signed)
Plan: Wear your wrist brace as needed for pain.  If you do not get relief with the injection over the next  2 weeks, please let me know.  If you have increase in redness, swelling or warmth, please seek immediate medical attention.  Please contact me with any questions or concerns.    You received a corticosteroid injection to reduce pain and inflammation.  Please note that it can take up to 2 weeks for this injection to fully work.  While many people will feel relief sooner, please be patient.      The injection contained a corticosteroid and a numbing agent.  The numbing agent can last for 1-6 hours.  After this wears off you may have increased pain until the steroid has a chance to work.    What are some of the possible side effects of a steroid injection?    Common side effects:  temporarily elevated blood sugar (in diabetic patients) that can last a few days   flushing of the skin, especially the face  temporary rise in blood pressure  discoloration or atrophy of the skin at the injection site    Call your doctor at once if you have:  persistent worsening pain or swelling, fever;  blurred vision, tunnel vision, eye pain, or seeing halos around lights;  fast or slow heartbeats;  increased blood pressure that is associated with severe headache, blurred vision, pounding in your neck or ears, anxiety, nosebleed;  headaches, ringing in your ears, dizziness, nausea, vision problems, pain behind your eyes    This is not a complete list of side effects and others may occur. Call your provider for medical advice about side effects.     What other drugs may be affected after the injection?  Many drugs can interact with steroids. Not all possible interactions are listed here. Tell your doctor about all your current medicines and any you start or stop using, especially:  an antibiotic or antifungal medication;  birth control pills or hormone replacement therapy;  a blood thinner (warfarin, Coumadin, and others);  a diuretic or water pill;  insulin or oral diabetes medicine;  medicine to treat tuberculosis;  a nonsteroidal anti-inflammatory drug or NSAID (aspirin, ibuprofen, naproxen, diclofenac, indomethacin, Advil, Aleve, Celebrex, and many others); or  seizure medication.      You can resume your normal daily activities, but consider resting the injected area for the next few days.          Thank you for coming to [facility] and our clinic today!     We aim to provide you with the highest quality, individualized care.  If you have any unanswered questions after the visit, please do not hesitate to reach out to [place] on MyChart or leave a message for the nurse.  ?  MyChart messages: These messages can be sent to your provider and will be checked by their clinical support staff.? The messages are checked throughout the day during normal business hours from 8:30 am-4:00 pm Monday-Friday, however responses may take up to 48 hours.? Please use this method of communication for non-urgent and non-emergent concerns, questions, refill requests or inquiries only.? ?Our team will help respond to all of your questions.? Please note that you may be asked to see a provider by either a telehealth or in person visit if it is deemed your questions are best handled in the clinic setting in person.??  ?  Please keep in mind, these messages are not  real time communications, so be patient when waiting for a response.    If you do not have access to MyChart, do not know how to use MyChart or have an issue that may require more extensive discussion, please call the nurses' call line: [phone].? This line is checked throughout the day and will be responded to as time allows.? Please note that return calls could take up to 48 hours, depending on the nature of the need.?  ?  If you have an issue that requires emergent attention that cannot wait; either call the Orthopaedics resident on call at [phone], consider coming to our [facility] walk-in clinic, or go to the nearest Emergency Department.    If you need to schedule future appointments, please call [phone].     We look forward to seeing you again in the future and appreciate you choosing [facility] for your care!    Thank you,                We provide innovative and comprehensive patient centered care that is supported by evidence-based research                                                                                                    RESEARCH PARTICIPATION    Please check out our current research studies to see if you or someone you know may qualify at:    [email or web address]

## 2023-06-28 MED FILL — VYNDAMAX 61 MG CAPSULE: ORAL | 30 days supply | Qty: 30 | Fill #6

## 2023-07-07 DIAGNOSIS — I5032 Chronic diastolic (congestive) heart failure: Principal | ICD-10-CM

## 2023-07-07 DIAGNOSIS — I43 Cardiomyopathy in diseases classified elsewhere: Principal | ICD-10-CM

## 2023-07-07 DIAGNOSIS — E854 Organ-limited amyloidosis: Principal | ICD-10-CM

## 2023-07-07 DIAGNOSIS — E852 Heredofamilial amyloidosis, unspecified: Principal | ICD-10-CM

## 2023-07-13 NOTE — Unmapped (Signed)
[facility] HF Amyloid Clinic Note    Referring Provider: Pcp, None Per Patient  [address] [place],  [place] [address]   Primary Provider: Artelia Laroche, MD  [address]  [place] [address]   Other Providers:  Dr Luretha Murphy    Reason for Visit:  Barbara Huber is a 82 y.o. female being seen for routine visit and continued care of cardiac amyloidosis .    Assessment & Plan:  1. 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 SPEP/UPEP/free light chains without concern. 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 Daughter is informed and has seen cardiology [name]).   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 2nd injection today. Continue vitamin A supplementation.   - DC'd Jardiance 10mg  daily due to recurrent UTIs  - Weight at baseline today at 211 lb, target 210-212 at home. Continue torsemide 60mg  daily with extra 20mg  PRN.   - Continue Tafamidis 61 mg daily   - Continue spironolactone 25 mg daily   - She is not interested in CardioMEMS at this time.     2. Persistent atrial fibrillation  - CHA2DS2-VASc score = 5 (1-gender, 2-Age, 1-DM, 1-HTN)  - We stopped Eliquis given recurrent anemia, shared decision making w/ pt and daughter. She is not interested in [place] (or any procedures) at this time  - She had been in afib since at least 07/2020; now s/p cardioversion 01/10/21 and continues to be in NSR since  - On amiodarone 200mg  daily - increased it back herself after seeing no improvement in tremors/jerking with decreased dose  - amiodarone monitoring - last PFTs in 04/2021. TSH/LFTs today    3. [name]  - Cr staying around 2.3-3.0; 2.5 today  - following with nephrology    4. COPD  - home O2 3-4L [place] - followed by pulm  - on Advair and Spiriva    5. Iron deficiency anemia  - Finished IV Iron - ferritin 261. No longer anemic, Hgb 12.7  Lab Results   Component Value Date    FERRITIN 261.2 04/14/2023     6. Anxiety  - previously on Zoloft    Follow-up:  Return in about 3 months (around 10/14/2023) for Return HF.     History of Present Illness:  Barbara Huber is a 82 y.o. female with PMHx of COPD on 3L home [place], CHF, [name], [name], [name] She was initially admitted with decompensated [name] in 2021 in setting of  Septic Shock d/t Gangrenous Cholecystitis s/p cholecystectomy. She underwent further evaluation with PYP scan which showed TTR amyloid. She has since been followed in clinic for management of her [name]. In 2022, had numerous admissions for weakness/urosepsis, shortness of breath/orthopnea/weight gain, lightheadedness/SOB/Blurry vision found to be anemic.    Summary of most recent visits:  - 01/16/22 [facility]): Feeling well from a cardiac standpoint. Started Zoloft for anxiety  - 02/13/22 [facility]): Weight 210-211. Feeling well. Taking extra torsemide 20mg  about every other day, which was stopped for elevated Cr.   - 03/13/22 ([name]): Weight 201-216, took metolazone PRN,  weight stabilized at 210lbs. Considering cardioMEMS.   - 05/13/22 ([name]): Had 3 iron infusions. Doing well w/ volume, weight 211-212lbs. Cooking, staying active around house.   - 05/21/22 ([name]): CP/SOB. Weight 217 from 213. She took metolazone x1 dose.   - 05/22/22 ([name]): Ed follow up. Back down to 213lbs. Still having CP w/ certain movement.   - 06/05/22 ([name]): Weight 214-216lb, should be 212lb.   - 06/09/22, 06/11/22, 06/22/22 ([name]): Weight down from 217 to 213lb, 211lb at home. Feeling well by 6/26.   - 07/15/22 ([name]): Switched inhaler back to Advair/Spiriva with improvement in breathing and O2 sat. Weight 207lbs, feeling more like herself.   - 08/19/22 ([name]): Feeling well. Weight 208-210lbs.   - 10/22/22 ([name]): Feeling well. Weight has been 210-214lbs.   - 12/04/22 ([name]): doing well overall, weight a bit up so taking extra torsemide.   - 02/10/23 ([name]): Weight 213-214lbs, feeling well, eating a lot. Had taken metolazone earlier in the week.   - 02/23/23 [facility]): Hospital follow up after mechanical fall with nondisplaced fractures of the left superior and inferior pubic rami. Taking torsemide 40mg , weight 211.   - 04/14/23 ([name]): Weight 210-212. 1st Amvuttra    Interval history  Today [name] presents to the clinic for routine follow up. She had her second Amvuttra shot today.   She is doing OK fluid wise. Weight is 211-212lbs. She is taking torsemide 60mg  a day, when she doesn't forget the third pill. She has no LE edema.  Her SOB is at baseline. No orthopnea/PND. She gets around the house OK. She washes her own clothes and bathes herself and makes something to eat in the kitchen. If she has help, she can cook in the kitchen for awhile. She just doesn't trust herself to turn quickly to grab things from drawers, etc. Neuropathy in her legs continues to bother her. Her sciatic nerve pain flares up every now and then as well.     Cardiovascular History & Procedures:    Cath / PCI:  none    CV Surgery:   none    EP Procedures and Devices:  none    Non-Invasive Evaluation(s):    Echo:  02/17/23    1. The left ventricle is normal in size with moderately increased wall  thickness.    2. The left ventricular systolic function is hyperdynamic, LVEF is visually  estimated at >70%.    3. The left atrium is moderately dilated in size.    4. The right ventricle is normal in size, with normal systolic function.    5. There is mild pulmonary hypertension.    12/09/21  Summary    1. The left ventricle is normal in size with severely increased wall  thickness.    2. The left ventricular systolic function is hyperdynamic, LVEF is visually  estimated at >70%. 3. There is grade III diastolic dysfunction (severely elevated filling  pressure).    4. The left atrium is moderately to severely dilated in size.    5. The right ventricle is mildly dilated in size, with normal systolic  function.    6. The right atrium is mildly dilated  in size.    7. IVC size and inspiratory change suggest elevated right atrial pressure.  (10-20 mmHg).    04/24/21   1. The left ventricle is normal in size with mildly increased wall  thickness.    2. The left ventricular systolic function is normal, LVEF is visually  estimated  at > 55%.    3. There is grade II diastolic dysfunction (elevated filling pressure).    4. Mitral annular calcification is present (mild).    5. The left atrium is mildly dilated in size.    6. The right ventricle is mildly dilated in size, with low normal systolic  function.    7. IVC size and inspiratory change suggest elevated right atrial pressure.  (10-20 mmHg).    09/30/20  Summary    1. Limited study to assess systolic function.    2. IVC size and inspiratory change suggest normal right atrial pressure.  (0-5 mmHg).    3. The left ventricle is normal in size with mildly to moderately increased  wall thickness.    4. The left ventricular systolic function is normal with no obvious wall  motion abnormalities, LVEF is visually estimated at > 55%.    5. Mitral annular calcification is present.    6. The left atrium is mildly dilated in size.    7. The right ventricle is upper normal in size, with reduced systolic  Function.    Cardiac CT/MRI/Nuclear Tests:   None    6 Minute Walk:  None    Cardiopulmonary Stress Tests:   None               Other Past Medical History:  See below for the complete EPIC list of past medical and surgical history.      Allergies:  Nitrofurantoin and Lipitor [atorvastatin]    Current Medications:  Current Outpatient Medications   Medication Sig Dispense Refill    acetaminophen (TYLENOL) 500 MG tablet Take 2 tablets (1,000 mg total) by mouth Three (3) times a day.      albuterol HFA 90 mcg/actuation inhaler Inhale 2 puffs every eight (8) hours as needed for wheezing.      amiodarone (PACERONE) 200 MG tablet TAKE ONE TABLET BY MOUTH ONCE DAILY 90 tablet 3    blood sugar diagnostic (ACCU-CHEK GUIDE TEST STRIPS) Strp Use to check blood sugar daily as instructed by clinic. Dx E11.9 For daily testing 100 strip 3    blood-glucose meter kit Use as instructed Daily testing 1 each 0    cranberry 500 mg cap Take 500 mg by mouth daily with evening meal.      cyclobenzaprine (FLEXERIL) 5 MG tablet Take 1.5 tablets (7.5 mg total) by mouth two (2) times a day as needed for muscle spasms. 90 tablet 1    ergocalciferol-1,250 mcg, 50,000 unit, (DRISDOL) 1,250 mcg (50,000 unit) capsule Take 1 capsule (1,250 mcg total) by mouth once a week. 12 capsule 3    estradioL (ESTRACE) 0.01 % (0.1 mg/gram) vaginal cream Insert 2 g into the vagina Two (2) times a week. 42.5 g 3    ferrous sulfate 325 (65 FE) MG EC tablet Take 1 tablet (325 mg total) by mouth Every Monday, Wednesday, and Friday. 36 tablet 3    fosfomycin (MONUROL) 3 gram Pack       gabapentin (NEURONTIN) 300 MG capsule Take 1 capsule (300 mg total) by mouth two (2) times a day. 180 capsule 3    insulin glargine (BASAGLAR, LANTUS) 100 unit/mL (3 mL) injection pen Inject 0.04 mL (4 Units total) under the skin nightly.      lancets Misc 1 each by Miscellaneous route daily. Accu Check 100 each 6    metOLazone (ZAROXOLYN) 5 MG tablet Take 1 tablet (5 mg total) by mouth daily as needed (when instructed  by cardiology clinic).      montelukast (SINGULAIR) 10 mg tablet Take 1 tablet (10 mg total) by mouth every morning. 90 tablet 2    NARCAN 4 mg/actuation nasal spray 1 spray into alternating nostrils once as needed (opioid overdose). PRN - Emergency use.      NON FORMULARY APPLY FROM NECK DOWN TWICE A DAY      OXYCONTIN 10 mg TR12 12 hr crush resistant ER/CR tablet Take 1 tablet (10 mg total) by mouth every twelve (12) hours.      OXYGEN-AIR DELIVERY SYSTEMS MISC 5 L by Miscellaneous route. Currently using 3  L/min via [employer]      pantoprazole (PROTONIX) 20 MG tablet TAKE 1 TABLET(20 MG) BY MOUTH DAILY 90 tablet 3    pen needle, diabetic (BD ULTRA-FINE SHORT PEN NEEDLE) 31 gauge x 5/16 (8 mm) Ndle Give 4 units of insulin each night 100 each 1    rosuvastatin (CRESTOR) 5 MG tablet Take 1 tablet (5 mg total) by mouth every other day. 15 tablet 11    spironolactone (ALDACTONE) 25 MG tablet TAKE 1 TABLET(25 MG) BY MOUTH DAILY 90 tablet 3    tafamidis (VYNDAMAX) 61 mg cap Take 1 capsule (61 mg) by mouth daily. 30 capsule 11    tiotropium bromide (SPIRIVA RESPIMAT) 2.5 mcg/actuation inhalation mist Inhale 2 puffs daily. 4 g 11    torsemide (DEMADEX) 20 MG tablet Take 3 tablets (60 mg total) by mouth daily. 270 tablet 3    vitamin A-3,000 mcg RAE, 10,000 UNIT, 3,000 mcg RAE (10,000 UNIT) capsule Take 1 capsule (3,000 mcg of RAE total) by mouth daily.      vutrisiran (AMVUTTRA) 25 mg/0.5 mL injection Inject 0.5 mL (25 mg total) under the skin once. Every 12 Weeks      fluticasone propion-salmeterol (ADVAIR HFA) 115-21 mcg/actuation inhaler Inhale 2 puffs two (2) times a day. 12 g 11     No current facility-administered medications for this visit.       Family History:  The patient's family history includes Cancer in her father; Hypertension in her mother; No Known Problems in her brother, brother, maternal aunt, maternal grandfather, maternal grandmother, maternal uncle, paternal aunt, paternal grandfather, paternal grandmother, paternal uncle, sister, sister, and another family member.    Social history:  She  reports that she quit smoking about 5 years ago. Her smoking use included cigarettes. She has never used smokeless tobacco. She reports that she does not drink alcohol and does not use drugs.    Review of Systems:  As per HPI.  Rest of the review of ten systems is negative or unremarkable except as stated above.    Physical Exam:  VITAL SIGNS:   Vitals:    07/14/23 1422   BP: 120/62   Pulse: 69   SpO2: 92%     Wt Readings from Last 3 Encounters:   07/14/23 95.7 kg (211 lb)   07/14/23 95.5 kg (210 lb 8 oz)   06/17/23 95.3 kg (210 lb)      Today's Body mass index is 34.06 kg/m??.   Height: 167.6 cm (5' 6)  CONSTITUTIONAL: well-appearing in no acute distress  EYES: Conjunctivae and sclerae clear and anicteric.  ENT: Benign.   CARDIOVASCULAR: JVP not seen above the clavicle with HOB at 90 degrees. Rate and rhythm are regular.  There is no lifts or heaves.  Normal S1, S2. There is no murmur, gallops or rubs.  Radial and pedal pulses are 2+, bilaterally.  There is no edema to ankles, bilaterally.   RESPIRATORY: Decreased breath sounds at bases.  There are no wheezes.  GASTROINTESTINAL: Soft, non-tender, with audible bowel sounds. Abdomen nondistended.  Liver is nonpalpable.  SKIN: No rashes, ecchymosis or petechiae.  Warm, well perfused.   MUSCULOSKELETAL:  no joint swelling   NEURO/PSYCH: Appropriate mood and affect. Alert and oriented to person, place, and time. No gross motor or sensory deficits evident.    Pertinent Laboratory Studies:   Office Visit on 07/14/2023   Component Date Value Ref Range Status    EKG Ventricular Rate 07/14/2023 69  BPM Preliminary    EKG Atrial Rate 07/14/2023 69  BPM Preliminary    EKG P-R Interval 07/14/2023 188  ms Preliminary    EKG QRS Duration 07/14/2023 108  ms Preliminary    EKG Q-T Interval 07/14/2023 458  ms Preliminary    EKG QTC Calculation 07/14/2023 490  ms Preliminary    EKG Calculated P Axis 07/14/2023 22  degrees Preliminary    EKG Calculated R Axis 07/14/2023 131  degrees Preliminary    EKG Calculated T Axis 07/14/2023 -9  degrees Preliminary    QTC Fredericia 07/14/2023 480  ms Preliminary   Office Visit on 04/14/2023   Component Date Value Ref Range Status    Hemoglobin A1C 04/14/2023 5.8 (H)  4.8 - 5.6 % Final    Estimated Average Glucose 04/14/2023 120  mg/dL Final    Sodium 21/30/8657 141  135 - 145 mmol/L Final    Potassium 04/14/2023 4.6  3.4 - 4.8 mmol/L Final    Chloride 04/14/2023 107  98 - 107 mmol/L Final    CO2 04/14/2023 27.2  20.0 - 31.0 mmol/L Final    Anion Gap 04/14/2023 7  5 - 14 mmol/L Final    BUN 04/14/2023 70 (H)  9 - 23 mg/dL Final    Creatinine 84/69/6295 2.48 (H)  0.55 - 1.02 mg/dL Final    BUN/Creatinine Ratio 04/14/2023 28   Final    eGFR [name]-EPI (2021) Female 04/14/2023 19 (L)  >=60 mL/min/1.34m2 Final    Glucose 04/14/2023 212 (H)  70 - 179 mg/dL Final    Calcium 28/41/3244 9.1  8.7 - 10.4 mg/dL Final    Iron 12/30/7251 44 (L)  50 - 170 ug/dL Final    TIBC 66/44/0347 235 (L)  250 - 425 ug/dL Final    Iron Saturation (%) 04/14/2023 19 (L)  20 - 55 % Final    Ferritin 04/14/2023 261.2  7.3 - 270.7 ng/mL Final    WBC 04/14/2023 8.9  3.6 - 11.2 10*9/L Final    RBC 04/14/2023 4.26  3.95 - 5.13 10*12/L Final    HGB 04/14/2023 12.7  11.3 - 14.9 g/dL Final    HCT 42/59/5638 39.1  34.0 - 44.0 % Final    MCV 04/14/2023 91.7  77.6 - 95.7 fL Final    MCH 04/14/2023 29.8  25.9 - 32.4 pg Final    MCHC 04/14/2023 32.4  32.0 - 36.0 g/dL Final    RDW 75/64/3329 13.6  12.2 - 15.2 % Final    MPV 04/14/2023 9.3  6.8 - 10.7 fL Final    Platelet 04/14/2023 195  150 - 450 10*9/L Final    BNP 04/14/2023 120.47 (H)  <=100 pg/mL Final    Magnesium 04/14/2023 2.2  1.6 - 2.6 mg/dL Final       Lab Results   Component Value Date    PRO-BNP 3,254.0 (H) 12/04/2022  PRO-BNP 3,420.0 (H) 07/03/2021    PRO-BNP 905.0 (H) 03/21/2021    Creatinine 2.48 (H) 04/14/2023    Creatinine 2.68 (H) 02/23/2023    Creatinine 0.79 01/06/2011    BUN 70 (H) 04/14/2023    BUN 78 (H) 02/23/2023    BUN 20 01/06/2011    Sodium 141 04/14/2023    Sodium 140 01/06/2011    Potassium 4.6 04/14/2023    Potassium 4.1 01/06/2011    CO2 27.2 04/14/2023    CO2 30 01/06/2011    Magnesium 2.2 04/14/2023    Magnesium 2.0 01/06/2011    Total Bilirubin 0.5 02/23/2023    INR 2.15 07/01/2021    INR 1.0 01/06/2011       Lab Results   Component Value Date    Digoxin Level 0.9 10/01/2020       Lab Results   Component Value Date    TSH 2.196 02/15/2023    Cholesterol 95 12/06/2021    Triglycerides 66 12/06/2021    HDL 42 12/06/2021    Non-HDL Cholesterol 53 (L) 12/06/2021    LDL Calculated 40 12/06/2021       Lab Results   Component Value Date    WBC 8.9 04/14/2023    WBC 12.5 (H) 01/06/2011    HGB 12.7 04/14/2023    HGB 12.9 01/06/2011    HCT 39.1 04/14/2023    HCT 40.8 01/06/2011    Platelet 195 04/14/2023    Platelet 260 01/06/2011       Pertinent Test Results from Today:  None    Other pertinent records were reviewed.    The following are further history from the patient's EPIC record for reference:     Past Medical History:   Diagnosis Date    Acute kidney injury superimposed on chronic kidney disease (CMS-HCC) 10/11/2020    Acute on chronic diastolic (congestive) heart failure (CMS-HCC) 08/23/2020    Acute on chronic diastolic congestive heart failure (CMS-HCC)     AKI (acute kidney injury) (CMS-HCC) 04/14/2015    Lab Results  Component  Value  Date     CREATININE  1.90 (H)  06/12/2021     Had a bump in her creatinine when she was taking her diuretics every day.  She is currently taking 40 mg daily of torsemide and 50 mg of spironolactone.  Her volume status is fragile.  Previously when she stopped her diuretic she becomes short of breath.  Plan: We will check her BMP today.  We will likely have to go to 40    Anemia 08/22/2019    Iron deficiency anemia          Lab Results      Component    Value    Date           WBC    9.2    03/21/2021           RBC    3.67 (L)    03/21/2021           HGB    9.9 (L)    03/21/2021           HCT    30.7 (L)    03/21/2021           MCV    83.7    03/21/2021           MCH    26.9    03/21/2021           MCHC  32.1    03/21/2021           RDW    21.9 (H)    03/21/2021           PLT    318     Arthritis     Calculus of kidney     Calculus of ureter     CHF (congestive heart failure) (CMS-HCC)     Chronic atrial fibrillation (CMS-HCC) 07/20/2019    COPD (chronic obstructive pulmonary disease) (CMS-HCC)     Diabetes (CMS-HCC)     Gangrenous cholecystitis 10/11/2020    Generalized edema  06/17/2021    GERD (gastroesophageal reflux disease)     Hydronephrosis     Hypertension     Hyponatremia 10/11/2020    Intermediate coronary syndrome (CMS-HCC) 03/13/2014    Lower extremity edema 09/28/2020    Lumbar stenosis     Microscopic hematuria     Nausea alone     Nephrolithiasis 04/17/2016    Neuropathy     Nocturia     Nocturia 07/01/2017    Other chronic cystitis     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  See plans for anemia, AKI  We are also tapering her gabapentin.  Currently on 300 mg 3 times daily.  We will decrease it to twice daily, and then nightly.  She states she is not having any recurrence of her pain.    Pulmonary hypertension (CMS-HCC)     Renal colic     Shortness of breath 04/28/2020    Sleep apnea     Unstable angina pectoris (CMS-HCC) 03/13/2014       Past Surgical History:   Procedure Laterality Date    BACK SURGERY  1995    CARPAL TUNNEL RELEASE Left 2014    HYSTERECTOMY  1971    IR INSERT CHOLECYSTOSMY TUBE PERCUTANEOUS  10/02/2020    IR INSERT CHOLECYSTOSMY TUBE PERCUTANEOUS 10/02/2020 Braulio Conte, MD IMG VIR H&V [facility]    LUMBAR DISC SURGERY      PR REMOVAL GALLBLADDER N/A 10/06/2020    Procedure: CHOLECYSTECTOMY;  Surgeon: Katherina Mires, MD;  Location: MAIN [facility];  Service: Trauma    PR RIGHT HEART CATH O2 SATURATION & CARDIAC OUTPUT N/A 09/30/2020    Procedure: Right Heart Catheterization;  Surgeon: Neal Dy, MD;  Location: [facility] CATH;  Service: Cardiology    PR RIGHT HEART CATH O2 SATURATION & CARDIAC OUTPUT N/A 01/09/2021    Procedure: Right Heart Catheterization;  Surgeon: Lesle Reek, MD;  Location: [facility] CATH;  Service: Cardiology

## 2023-07-14 ENCOUNTER — Ambulatory Visit: Admit: 2023-07-14 | Discharge: 2023-07-14 | Payer: MEDICARE | Attending: Adult Health | Primary: Adult Health

## 2023-07-14 ENCOUNTER — Ambulatory Visit: Admit: 2023-07-14 | Discharge: 2023-07-14 | Payer: MEDICARE

## 2023-07-14 DIAGNOSIS — E852 Heredofamilial amyloidosis, unspecified: Principal | ICD-10-CM

## 2023-07-14 DIAGNOSIS — E559 Vitamin D deficiency, unspecified: Principal | ICD-10-CM

## 2023-07-14 DIAGNOSIS — E854 Organ-limited amyloidosis: Principal | ICD-10-CM

## 2023-07-14 DIAGNOSIS — I43 Cardiomyopathy in diseases classified elsewhere: Principal | ICD-10-CM

## 2023-07-14 DIAGNOSIS — R000 Tachycardia, unspecified: Principal | ICD-10-CM

## 2023-07-14 DIAGNOSIS — I5032 Chronic diastolic (congestive) heart failure: Principal | ICD-10-CM

## 2023-07-14 LAB — COMPREHENSIVE METABOLIC PANEL
ALBUMIN: 3.4 g/dL (ref 3.4–5.0)
ALKALINE PHOSPHATASE: 132 U/L — ABNORMAL HIGH (ref 46–116)
ALT (SGPT): 8 U/L — ABNORMAL LOW (ref 10–49)
ANION GAP: 9 mmol/L (ref 5–14)
AST (SGOT): 16 U/L (ref <=34)
BILIRUBIN TOTAL: 0.4 mg/dL (ref 0.3–1.2)
BLOOD UREA NITROGEN: 58 mg/dL — ABNORMAL HIGH (ref 9–23)
BUN / CREAT RATIO: 23
CALCIUM: 9.6 mg/dL (ref 8.7–10.4)
CHLORIDE: 102 mmol/L (ref 98–107)
CO2: 27.7 mmol/L (ref 20.0–31.0)
CREATININE: 2.5 mg/dL — ABNORMAL HIGH
EGFR CKD-EPI (2021) FEMALE: 19 mL/min/{1.73_m2} — ABNORMAL LOW (ref >=60)
GLUCOSE RANDOM: 158 mg/dL (ref 70–179)
POTASSIUM: 4.4 mmol/L (ref 3.4–4.8)
PROTEIN TOTAL: 7.6 g/dL (ref 5.7–8.2)
SODIUM: 139 mmol/L (ref 135–145)

## 2023-07-14 LAB — TSH: THYROID STIMULATING HORMONE: 1.376 u[IU]/mL (ref 0.550–4.780)

## 2023-07-14 LAB — MAGNESIUM: MAGNESIUM: 2.3 mg/dL (ref 1.6–2.6)

## 2023-07-14 LAB — B-TYPE NATRIURETIC PEPTIDE: B-TYPE NATRIURETIC PEPTIDE: 118.62 pg/mL — ABNORMAL HIGH (ref <=100)

## 2023-07-14 MED ADMIN — vutrisiran (AMVUTTRA) syringe 25 mg: 25 mg | SUBCUTANEOUS | @ 18:00:00 | Stop: 2023-07-14 | NDC 71336100301

## 2023-07-14 NOTE — Unmapped (Signed)
Today,    MEDICATIONS:  NO medication changes today.    Call if you have questions about your medications.    LABS:  We will call you if your labs need attention.    NEXT APPOINTMENT:  Return to clinic in 3 month with Derwood Kaplan NP      In general, to take care of your heart failure:  -Limit your fluid intake to 2 Liters (half-gallon) per day.    -Limit your salt intake to ideally 2-3 grams (2000-3000 mg) per day.  -Weigh yourself daily and record, and bring that weight diary to your next appointment.  (Weight gain of 2-3 pounds in 1 day typically means fluid weight.)    The medications for your heart are to help your heart and help you live longer.    Please contact us before stopping any of your heart medications.    Call the clinic at [phone] with questions.  Our clinic fax number is [phone].  If you need to reschedule future appointments, please call [phone] or [phone]  My office number (c/o De Burrs RN) is [phone] if you need further assistance.  After office hours, if you have urgent questions/problems, contact the on-call cardiologist through the hospital operator: [phone].    Please do not send a MyChart message for potentially life-threatening symptoms.  Please call 911 for a true medical emergency.    To learn more about heart failure, please read [facility] Learning to Live with Heart Failure - Available online at:  [email or web address] - open the window for Medical Management and click the link Living with Heart Failure   (Can search [facility] heart failure on the web to find the link.)

## 2023-07-14 NOTE — Unmapped (Signed)
[facility] to [facility] for Amvuttra injection Q12 weeks. Patient on 3 liters of continuous oxygen via [place], transferred to Infusion oxygen.    1359 Amvuttra 25 mg administered subcutaneously to right upper arm, bandaid applied.     Patient discharged from Infusion Center in wheelchair with daughter in stable condition with no acute distress.

## 2023-07-15 LAB — VITAMIN D 25 HYDROXY: VITAMIN D, TOTAL (25OH): 33.7 ng/mL (ref 20.0–80.0)

## 2023-07-17 MED ORDER — SPIRIVA RESPIMAT 2.5 MCG/ACTUATION SOLUTION FOR INHALATION
Freq: Every day | RESPIRATORY_TRACT | 11 refills | 0 days
Start: 2023-07-17 — End: 2024-07-16

## 2023-07-19 MED ORDER — SPIRIVA RESPIMAT 2.5 MCG/ACTUATION SOLUTION FOR INHALATION
Freq: Every day | RESPIRATORY_TRACT | 11 refills | 0 days | Status: CP
Start: 2023-07-19 — End: 2024-07-18

## 2023-07-21 NOTE — Unmapped (Signed)
I reviewed this patient case and all documentation provided by the learner and was readily available for consultation during their interaction with the patient.  I agree with the assessment and plan listed below.    Mckaylin Bastien, PharmD   [facility] Specialty Pharmacist

## 2023-07-21 NOTE — Unmapped (Signed)
[facility] Refill Coordination Note    Specialty Medication(s) to be Shipped:   Cardiology: Vyndamax    Other medication(s) to be shipped: No additional medications requested for fill at this time     Barbara Huber, DOB: August 28, 1941  Phone: [phone] (home)       All above HIPAA information was verified with patient.     Was a Nurse, learning disability used for this call? No    Completed refill call assessment today to schedule patient's medication shipment from the [facility] [phone]).  All relevant notes have been reviewed.     Specialty medication(s) and dose(s) confirmed: Regimen is correct and unchanged.   Changes to medications: Klaudia reports no changes at this time.  Changes to insurance: No  New side effects reported not previously addressed with a pharmacist or physician: None reported  Questions for the pharmacist: No    Confirmed patient received a Conservation officer, historic buildings and a Surveyor, mining with first shipment. The patient will receive a drug information handout for each medication shipped and additional FDA Medication Guides as required.       DISEASE/MEDICATION-SPECIFIC INFORMATION        N/A    SPECIALTY MEDICATION ADHERENCE     Medication Adherence    Patient reported X missed doses in the last month: 0  Specialty Medication: Vyndamax 61 mg  Informant: patient         Were doses missed due to medication being on hold? No    Vyndamax 61 mg: 10 days of medicine on hand       REFERRAL TO PHARMACIST     Referral to the pharmacist: Not needed      [facility]     Shipping address confirmed in Epic.       Delivery Scheduled: Yes, Expected medication delivery date: 07/27/2023.     Medication will be delivered via Next Day Courier to the prescription address in Epic WAM.    Arlana Lindau, PharmD   [facility] Specialty Pharmacist

## 2023-07-26 MED FILL — VYNDAMAX 61 MG CAPSULE: ORAL | 30 days supply | Qty: 30 | Fill #7

## 2023-07-30 ENCOUNTER — Ambulatory Visit: Admit: 2023-07-30 | Discharge: 2023-07-31 | Payer: MEDICARE | Attending: Family | Primary: Family

## 2023-07-30 ENCOUNTER — Ambulatory Visit: Admit: 2023-07-30 | Discharge: 2023-07-31 | Payer: MEDICARE

## 2023-07-30 NOTE — Unmapped (Signed)
Plan: Keep the elbow wrap on for the rest of the day, loosening if needed.  You may use the Ace bandage until all swelling resolves.  Try topical Voltaren gel to your wrist and finger up to 4 times a day as needed for pain.  Monitor for any signs or symptoms of infection.  If you notice any increased warmth, swelling redness or pain, please seek immediate medical attention.      Thank you for coming to [facility] and our clinic today!     We aim to provide you with the highest quality, individualized care.  If you have any unanswered questions after the visit, please do not hesitate to reach out to [place] on MyChart or leave a message for the nurse.  ?  MyChart messages: These messages can be sent to your provider and will be checked by their clinical support staff.? The messages are checked throughout the day during normal business hours from 8:30 am-4:00 pm Monday-Friday, however responses may take up to 48 hours.? Please use this method of communication for non-urgent and non-emergent concerns, questions, refill requests or inquiries only.? ?Our team will help respond to all of your questions.? Please note that you may be asked to see a provider by either a telehealth or in person visit if it is deemed your questions are best handled in the clinic setting in person.??  ?  Please keep in mind, these messages are not real time communications, so be patient when waiting for a response.    If you do not have access to MyChart, do not know how to use MyChart or have an issue that may require more extensive discussion, please call the nurses' call line: [phone].? This line is checked throughout the day and will be responded to as time allows.? Please note that return calls could take up to 48 hours, depending on the nature of the need.?  ?  If you have an issue that requires emergent attention that cannot wait; either call the Orthopaedics resident on call at [phone], consider coming to our [facility] walk-in clinic, or go to the nearest Emergency Department.    If you need to schedule future appointments, please call [phone].     We look forward to seeing you again in the future and appreciate you choosing [facility] for your care!    Thank you,                We provide innovative and comprehensive patient centered care that is supported by evidence-based research                                                                                                    RESEARCH PARTICIPATION    Please check out our current research studies to see if you or someone you know may qualify at:    [email or web address]

## 2023-07-30 NOTE — Unmapped (Signed)
SPORTS MEDICINE RETURN VISIT    ASSESSMENT AND PLAN      Diagnosis ICD-10-CM Associated Orders   1. Olecranon bursitis of left elbow  M70.22 XR Elbow 3 Or More Views Left     Culture, Joint Fluid     Cell Count (fluid analysis)           I recommended aspirating the olecranon bursitis given the large amount of fluid apparent on exam and concern for infection.  I do not recommend wrist or finger steroid injection at this time until we get results back from fluid analysis.  She agreed with aspiration of the olecranon bursa in clinic, sending out fluid to the lab.  An Ace bandage was applied after aspiration.    Return for TBD.    Procedure(s):  Aspiration of the left olecranon bursa without steroid injection 45 mL     ADDENDUM: Fluid analysis showed monosodium urate crystals with no organisms present/growth to date from culture.  Will prescribe a Medrol Dosepak.  Would likely defer to primary care for chronic gout management considering medical complexity.      SUBJECTIVE     Chief Complaint:   Chief Complaint   Patient presents with    Left Arm - Pain     Left Wrist pain/Left middle finger 9/10 pain, left elbow has has a large new effusion in the last two weeks.       History of Present Illness: 82 y.o. female with multiple medical comorbidities including chronic kidney disease and pulmonary hypertension with oxygen dependence Who presents with her daughter for left ulnar-sided wrist and long finger PIP joint pain, requesting injection.  They also note left elbow and forearm swelling times approximately 2 weeks.  No significant pain in the elbow.  No history of gout or bursitis.  They noted it was more swollen last week and seem to have some warmth but has never been painful.  Multiple prior bilateral hand and wrist injections last in late April 2024.    Past Medical History:   Past Medical History:   Diagnosis Date    Acute kidney injury superimposed on chronic kidney disease (CMS-HCC) 10/11/2020    Acute on chronic diastolic (congestive) heart failure (CMS-HCC) 08/23/2020    Acute on chronic diastolic congestive heart failure (CMS-HCC)     AKI (acute kidney injury) (CMS-HCC) 04/14/2015    Lab Results  Component  Value  Date     CREATININE  1.90 (H)  06/12/2021     Had a bump in her creatinine when she was taking her diuretics every day.  She is currently taking 40 mg daily of torsemide and 50 mg of spironolactone.  Her volume status is fragile.  Previously when she stopped her diuretic she becomes short of breath.  Plan: We will check her BMP today.  We will likely have to go to 40    Anemia 08/22/2019    Iron deficiency anemia          Lab Results      Component    Value    Date           WBC    9.2    03/21/2021           RBC    3.67 (L)    03/21/2021           HGB    9.9 (L)    03/21/2021           HCT  30.7 (L)    03/21/2021           MCV    83.7    03/21/2021           MCH    26.9    03/21/2021           MCHC    32.1    03/21/2021           RDW    21.9 (H)    03/21/2021           PLT    318     Arthritis     Calculus of kidney     Calculus of ureter     CHF (congestive heart failure) (CMS-HCC)     Chronic atrial fibrillation (CMS-HCC) 07/20/2019    COPD (chronic obstructive pulmonary disease) (CMS-HCC)     Diabetes (CMS-HCC)     Gangrenous cholecystitis 10/11/2020    Generalized edema  06/17/2021    GERD (gastroesophageal reflux disease)     Hydronephrosis     Hypertension     Hyponatremia 10/11/2020    Intermediate coronary syndrome (CMS-HCC) 03/13/2014    Lower extremity edema 09/28/2020    Lumbar stenosis     Microscopic hematuria     Nausea alone     Nephrolithiasis 04/17/2016    Neuropathy     Nocturia     Nocturia 07/01/2017    Other chronic cystitis     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  See plans for anemia, AKI  We are also tapering her gabapentin.  Currently on 300 mg 3 times daily.  We will decrease it to twice daily, and then nightly.  She states she is not having any recurrence of her pain.    Pulmonary hypertension (CMS-HCC)     Renal colic     Shortness of breath 04/28/2020    Sleep apnea     Unstable angina pectoris (CMS-HCC) 03/13/2014         OBJECTIVE     Physical Exam:  Vitals:   Wt Readings from Last 3 Encounters:   07/14/23 95.7 kg (211 lb)   07/14/23 95.5 kg (210 lb 8 oz)   06/17/23 95.3 kg (210 lb)     Estimated body mass index is 34.06 kg/m?? as calculated from the following:    Height as of 07/14/23: 167.6 cm (5' 6).    Weight as of 07/14/23: 95.7 kg (211 lb).  Gen: Well-appearing female in no acute distress  MSK: Swelling from the left olecranon bursa extending into the forearm without tenderness to palpation.  Elbow range of motion is full.  Tender to palpation about the ulnar aspect of the wrist with mild enlargement noted.  Pain with wrist range of motion.  Decreased range of motion with the swelling and enlargement of the long finger PIP.  Enlargement of multiple other IP joints without tenderness palpation or fluctuance.  Mild ecchymosis noted about the posterior left elbow without any excessive warmth or erythema.  Capillary refill is less than 3 seconds.  Skin is warm, dry and intact.    Imaging/other tests: Left elbow X-rays taken today reviewed with patient in exam room show:  Massive posterior elbow soft tissue swelling.   Osteopenia. No fracture. Joint alignment is normal. Joint spaces are normal. Medial and lateral epicondyle enthesophytes. No osseous erosions.       @SPORTSPROMIS @      ADMINISTRATIVE     I have  personally reviewed and interpreted the images (as available).  Point-of-care ultrasound imaging is on file and stored in a permanent location (if performed).  I have personally reviewed prior records and incorporated relevant information above (as available).    @SMIBILLING @    PROCEDURES     Med Joint Inj: L olecranon bursa on 07/30/2023 11:40 AM  Indications: joint swelling and diagnostic evaluation  Details: 18 G needle, posterior approach  Laterality: left  Location: elbow  Aspirate: 45 mL bloody and cloudy; sent for lab analysis  No drug administered   Outcome: tolerated well, no immediate complications  Procedure, treatment alternatives, risks and benefits explained, specific risks discussed. Consent was given by the patient. Immediately prior to procedure a time out was called to verify the correct patient, procedure, equipment, support staff and site/side marked as required. Patient was prepped and draped in the usual sterile fashion.     Medical Care Team Attestation: All ProcDoc orders were read back and verbally confirmed with the procedure provider, including but not limited to patient name, medication name, dose, and route, before any actions were taken.  Self Attestation: I, the provider, prepared the medications and administered for this procedure as documented above.  Provider Attestation: The information documented by members of my medical care team was reviewed and verified for accuracy by me.           DME     DME ORDER:  Dx:  ,

## 2023-07-31 MED ORDER — METHYLPREDNISOLONE 4 MG TABLETS IN A DOSE PACK
0 refills | 0 days | Status: CP
Start: 2023-07-31 — End: ?

## 2023-08-18 ENCOUNTER — Ambulatory Visit: Admit: 2023-08-18 | Discharge: 2023-08-23 | Disposition: A | Discharge status: Home Health | Payer: MEDICARE

## 2023-08-18 DIAGNOSIS — I5032 Chronic diastolic (congestive) heart failure: Principal | ICD-10-CM

## 2023-08-18 LAB — COMPREHENSIVE METABOLIC PANEL
ALBUMIN: 3.7 g/dL (ref 3.4–5.0)
ALKALINE PHOSPHATASE: 181 U/L — ABNORMAL HIGH (ref 46–116)
ALT (SGPT): 11 U/L (ref 10–49)
ANION GAP: 11 mmol/L (ref 5–14)
AST (SGOT): 26 U/L (ref <=34)
BILIRUBIN TOTAL: 0.5 mg/dL (ref 0.3–1.2)
BLOOD UREA NITROGEN: 79 mg/dL — ABNORMAL HIGH (ref 9–23)
BUN / CREAT RATIO: 25
CALCIUM: 9.6 mg/dL (ref 8.7–10.4)
CHLORIDE: 99 mmol/L (ref 98–107)
CO2: 23.9 mmol/L (ref 20.0–31.0)
CREATININE: 3.12 mg/dL — ABNORMAL HIGH
EGFR CKD-EPI (2021) FEMALE: 14 mL/min/{1.73_m2} — ABNORMAL LOW (ref >=60)
GLUCOSE RANDOM: 239 mg/dL — ABNORMAL HIGH (ref 70–179)
POTASSIUM: 4.2 mmol/L (ref 3.4–4.8)
PROTEIN TOTAL: 7.8 g/dL (ref 5.7–8.2)
SODIUM: 134 mmol/L — ABNORMAL LOW (ref 135–145)

## 2023-08-18 LAB — CBC W/ AUTO DIFF
BASOPHILS ABSOLUTE COUNT: 0.1 10*9/L (ref 0.0–0.1)
BASOPHILS RELATIVE PERCENT: 0.6 %
EOSINOPHILS ABSOLUTE COUNT: 0.1 10*9/L (ref 0.0–0.5)
EOSINOPHILS RELATIVE PERCENT: 0.8 %
HEMATOCRIT: 41.8 % (ref 34.0–44.0)
HEMOGLOBIN: 13.2 g/dL (ref 11.3–14.9)
LYMPHOCYTES ABSOLUTE COUNT: 0.5 10*9/L — ABNORMAL LOW (ref 1.1–3.6)
LYMPHOCYTES RELATIVE PERCENT: 4.1 %
MEAN CORPUSCULAR HEMOGLOBIN CONC: 31.7 g/dL — ABNORMAL LOW (ref 32.0–36.0)
MEAN CORPUSCULAR HEMOGLOBIN: 29.7 pg (ref 25.9–32.4)
MEAN CORPUSCULAR VOLUME: 93.8 fL (ref 77.6–95.7)
MEAN PLATELET VOLUME: 9.2 fL (ref 6.8–10.7)
MONOCYTES ABSOLUTE COUNT: 1.5 10*9/L — ABNORMAL HIGH (ref 0.3–0.8)
MONOCYTES RELATIVE PERCENT: 12 %
NEUTROPHILS ABSOLUTE COUNT: 10.5 10*9/L — ABNORMAL HIGH (ref 1.8–7.8)
NEUTROPHILS RELATIVE PERCENT: 82.5 %
NUCLEATED RED BLOOD CELLS: 0 /100{WBCs} (ref <=4)
PLATELET COUNT: 200 10*9/L (ref 150–450)
RED BLOOD CELL COUNT: 4.45 10*12/L (ref 3.95–5.13)
RED CELL DISTRIBUTION WIDTH: 14.7 % (ref 12.2–15.2)
WBC ADJUSTED: 12.8 10*9/L — ABNORMAL HIGH (ref 3.6–11.2)

## 2023-08-18 LAB — HIGH SENSITIVITY TROPONIN I - SERIAL: HIGH SENSITIVITY TROPONIN I: 103 ng/L (ref <=34)

## 2023-08-18 LAB — MAGNESIUM: MAGNESIUM: 2.3 mg/dL (ref 1.6–2.6)

## 2023-08-18 LAB — HIGH SENSITIVITY TROPONIN I - 2 HOUR SERIAL
HIGH SENSITIVITY TROPONIN - DELTA (0-2H): 29 ng/L (ref <=7)
HIGH-SENSITIVITY TROPONIN I - 2 HOUR: 132 ng/L (ref <=34)

## 2023-08-18 LAB — B-TYPE NATRIURETIC PEPTIDE: B-TYPE NATRIURETIC PEPTIDE: 342.57 pg/mL — ABNORMAL HIGH (ref <=100)

## 2023-08-18 NOTE — Unmapped (Signed)
Presents with generalized weakness and increased SOB x 2 days.Uses home O2@3L  . Denies fevers or urinary symptoms.

## 2023-08-19 LAB — URINALYSIS WITH MICROSCOPY
BILIRUBIN UA: NEGATIVE
GLUCOSE UA: NEGATIVE
HYALINE CASTS: 5 /LPF — ABNORMAL HIGH (ref 0–1)
KETONES UA: NEGATIVE
NITRITE UA: NEGATIVE
PH UA: 5 (ref 5.0–9.0)
PROTEIN UA: NEGATIVE
RBC UA: <1 /HPF (ref <=4)
SPECIFIC GRAVITY UA: 1.011 (ref 1.003–1.030)
SQUAMOUS EPITHELIAL: 2 /HPF (ref 0–5)
UROBILINOGEN UA: <2
WBC UA: 3 /HPF (ref 0–5)

## 2023-08-19 LAB — BASIC METABOLIC PANEL
ANION GAP: 8 mmol/L (ref 5–14)
ANION GAP: 12 mmol/L (ref 5–14)
BLOOD UREA NITROGEN: 87 mg/dL — ABNORMAL HIGH (ref 9–23)
BLOOD UREA NITROGEN: 89 mg/dL — ABNORMAL HIGH (ref 9–23)
BUN / CREAT RATIO: 28
BUN / CREAT RATIO: 27
CALCIUM: 9.1 mg/dL (ref 8.7–10.4)
CALCIUM: 9.4 mg/dL (ref 8.7–10.4)
CHLORIDE: 101 mmol/L (ref 98–107)
CHLORIDE: 97 mmol/L — ABNORMAL LOW (ref 98–107)
CO2: 27.9 mmol/L (ref 20.0–31.0)
CO2: 28 mmol/L (ref 20.0–31.0)
CREATININE: 3.13 mg/dL — ABNORMAL HIGH
CREATININE: 3.35 mg/dL — ABNORMAL HIGH
EGFR CKD-EPI (2021) FEMALE: 14 mL/min/{1.73_m2} — ABNORMAL LOW (ref >=60)
EGFR CKD-EPI (2021) FEMALE: 13 mL/min/{1.73_m2} — ABNORMAL LOW (ref >=60)
GLUCOSE RANDOM: 136 mg/dL (ref 70–179)
GLUCOSE RANDOM: 101 mg/dL (ref 70–179)
POTASSIUM: 3.9 mmol/L (ref 3.4–4.8)
POTASSIUM: 4 mmol/L (ref 3.4–4.8)
SODIUM: 137 mmol/L (ref 135–145)
SODIUM: 137 mmol/L (ref 135–145)

## 2023-08-19 LAB — CBC
HEMATOCRIT: 36.4 % (ref 34.0–44.0)
HEMOGLOBIN: 11.8 g/dL (ref 11.3–14.9)
MEAN CORPUSCULAR HEMOGLOBIN CONC: 32.6 g/dL (ref 32.0–36.0)
MEAN CORPUSCULAR HEMOGLOBIN: 30.2 pg (ref 25.9–32.4)
MEAN CORPUSCULAR VOLUME: 92.8 fL (ref 77.6–95.7)
MEAN PLATELET VOLUME: 9.1 fL (ref 6.8–10.7)
PLATELET COUNT: 161 10*9/L (ref 150–450)
RED BLOOD CELL COUNT: 3.92 10*12/L — ABNORMAL LOW (ref 3.95–5.13)
RED CELL DISTRIBUTION WIDTH: 14.1 % (ref 12.2–15.2)
WBC ADJUSTED: 9.7 10*9/L (ref 3.6–11.2)

## 2023-08-19 LAB — MAGNESIUM
MAGNESIUM: 2.2 mg/dL (ref 1.6–2.6)
MAGNESIUM: 2.3 mg/dL (ref 1.6–2.6)

## 2023-08-19 LAB — IRON PANEL
IRON SATURATION (CALC): 17 %
IRON: 30 ug/dL — ABNORMAL LOW
TOTAL IRON BINDING CAPACITY (CALC): 173.9 ug/dL — ABNORMAL LOW (ref 250.0–425.0)
TRANSFERRIN: 138 mg/dL — ABNORMAL LOW

## 2023-08-19 LAB — HIGH SENSITIVITY TROPONIN I - 6 HOUR SERIAL
HIGH SENSITIVITY TROPONIN - DELTA (2-6H): 18 ng/L — ABNORMAL HIGH (ref <=7)
HIGH-SENSITIVITY TROPONIN I - 6 HOUR: 150 ng/L (ref <=34)

## 2023-08-19 LAB — URIC ACID: URIC ACID: 17.2 mg/dL — ABNORMAL HIGH

## 2023-08-19 LAB — FERRITIN: FERRITIN: 336.3 ng/mL — ABNORMAL HIGH

## 2023-08-19 LAB — PHOSPHORUS
PHOSPHORUS: 4.5 mg/dL (ref 2.4–5.1)
PHOSPHORUS: 5.2 mg/dL — ABNORMAL HIGH (ref 2.4–5.1)

## 2023-08-19 LAB — HIGH SENSITIVITY TROPONIN I - SINGLE
HIGH SENSITIVITY TROPONIN I: 164 ng/L (ref <=34)
HIGH SENSITIVITY TROPONIN I: 152 ng/L (ref <=34)

## 2023-08-19 MED ADMIN — heparin (porcine) 5,000 unit/mL injection 5,000 Units: 5000 [IU] | SUBCUTANEOUS | @ 09:00:00 | Stop: 2023-08-19

## 2023-08-19 MED ADMIN — spironolactone (ALDACTONE) tablet 25 mg: 25 mg | ORAL | @ 14:00:00 | Stop: 2023-08-19

## 2023-08-19 MED ADMIN — furosemide (LASIX) injection 60 mg: 60 mg | INTRAVENOUS | @ 01:00:00 | Stop: 2023-08-18

## 2023-08-19 MED ADMIN — furosemide (LASIX) injection 80 mg: 80 mg | INTRAVENOUS | @ 06:00:00 | Stop: 2023-08-19

## 2023-08-19 MED ADMIN — fluticasone furoate-vilanterol (BREO ELLIPTA) 100-25 mcg/dose inhaler 1 puff: 1 | RESPIRATORY_TRACT | @ 12:00:00 | Stop: 2023-08-19

## 2023-08-19 MED ADMIN — acetaminophen (TYLENOL) tablet 650 mg: 650 mg | ORAL | @ 09:00:00 | Stop: 2023-08-19

## 2023-08-19 MED ADMIN — amiodarone (PACERONE) tablet 200 mg: 200 mg | ORAL | @ 14:00:00 | Stop: 2023-08-19

## 2023-08-19 MED ADMIN — umeclidinium (INCRUSE ELLIPTA) 62.5 mcg/actuation inhaler 1 puff: 1 | RESPIRATORY_TRACT | @ 12:00:00 | Stop: 2023-08-19

## 2023-08-19 MED ADMIN — pantoprazole (Protonix) EC tablet 20 mg: 20 mg | ORAL | @ 14:00:00 | Stop: 2023-08-19

## 2023-08-19 MED ADMIN — estradiol (ESTRACE) 0.01 % (0.1 mg/gram) vaginal cream 2 g: 2 g | VAGINAL | @ 14:00:00 | Stop: 2023-08-19

## 2023-08-19 MED ADMIN — furosemide (LASIX) 120 mg in sodium chloride (NS) 0.9 % 50 mL IVPB: 120 mg | INTRAVENOUS | @ 14:00:00 | Stop: 2023-08-19

## 2023-08-19 MED ADMIN — oxyCODONE (OxyCONTIN) 12 hr crush resistant ER/CR tablet 10 mg: 10 mg | ORAL | @ 14:00:00 | Stop: 2023-08-19

## 2023-08-19 MED ADMIN — gabapentin (NEURONTIN) capsule 300 mg: 300 mg | ORAL | @ 14:00:00 | Stop: 2023-08-19

## 2023-08-19 MED ADMIN — heparin (porcine) 5,000 unit/mL injection 5,000 Units: 5000 [IU] | SUBCUTANEOUS | @ 04:00:00

## 2023-08-19 MED ADMIN — montelukast (SINGULAIR) tablet 10 mg: 10 mg | ORAL | @ 14:00:00 | Stop: 2023-08-19

## 2023-08-19 MED ADMIN — oxyCODONE (OxyCONTIN) 12 hr crush resistant ER/CR tablet 10 mg: 10 mg | ORAL | @ 03:00:00 | Stop: 2023-09-01

## 2023-08-19 NOTE — Unmapped (Signed)
OCCUPATIONAL THERAPY  Evaluation (08/19/23 1420)    Patient Name:  Barbara Huber       Medical Record Number: [id number]     Date of Birth: August 03, 1941  Sex: Female      Post-Discharge Occupational Therapy Recommendations: Skilled OT services NOT indicated          Equipment Recommendation  OT DME Recommendations: None       OT Treatment Diagnosis:      SOB, DOE impacting ADLs and mobility    Assessment  Problem List: Decreased activity tolerance, Decreased endurance, Fall risk       Assessment: Barbara Huber is a 82 y.o. female with a PMHx of [name], cardiac amyloidosis, pulmonary hypertension, chronic respiratory failure on 3 L, COPD, [name] stage IV, atrial fibrillation, DM II that presented to [facility] with shortness of breath and found to be in a heart failure exacerbation.     Pt presents to acute OT services near functional baseline with above stated deficits, requiring Distant Supervision for self care and ADL tasks using RW. Pt reports grossly Mod I at baseline, lives with daughter and son in law who both work; pt reports decreased endurance recently, though still able to complete ADLs Mod I. Pt did require Min Assist for sit>stand from recliner, reported surface to be much lower than typical for her; progressed to Distant Supervision with RW. No further acute or post-acute OT needs identified this date.    Review of client factors, occupational history, assessment of occupational performance, and development of POC required Low complexity OT evaluation.       Today's Interventions: AMPAC 22/24, role of OT, POC, sitting tolerance and balance, functional transfers and mobility household distance, endurance, activity tolerance, energy conservation    Activity Tolerance During Today's Session  Tolerated treatment well    Plan  Planned Frequency of Treatment:    D/C Services for: D/C Services       GOALS:   Patient and Family Goals: Return to [place].    Prognosis:  Excellent  Positive Indicators:  PLOF, motivation, support  Barriers to Discharge: None    Subjective    Prior Functional Status Pt reports grossly Mod I at baseline using Rollator, lives with daughter and son in Social worker. Pt enjoys watching talk shows and preparing light meals. Reports daily routines have been slightly more challenging 2/2 limited endurance recently. Pt is on 3L O2 [place] at baseline.    Medical Tests / Procedures: Reviewed       Patient / Caregiver reports: Hopefully they get here soon!      Past Medical History:   Diagnosis Date    Acute kidney injury superimposed on chronic kidney disease (CMS-HCC) 10/11/2020    Acute on chronic diastolic (congestive) heart failure (CMS-HCC) 08/23/2020    Acute on chronic diastolic congestive heart failure (CMS-HCC)     AKI (acute kidney injury) (CMS-HCC) 04/14/2015    Lab Results  Component  Value  Date     CREATININE  1.90 (H)  06/12/2021     Had a bump in her creatinine when she was taking her diuretics every day.  She is currently taking 40 mg daily of torsemide and 50 mg of spironolactone.  Her volume status is fragile.  Previously when she stopped her diuretic she becomes short of breath.  Plan: We will check her BMP today.  We will likely have to go to 40    Anemia 08/22/2019    Iron deficiency anemia  Lab Results      Component    Value    Date           WBC    9.2    03/21/2021           RBC    3.67 (L)    03/21/2021           HGB    9.9 (L)    03/21/2021           HCT    30.7 (L)    03/21/2021           MCV    83.7    03/21/2021           MCH    26.9    03/21/2021           MCHC    32.1    03/21/2021           RDW    21.9 (H)    03/21/2021           PLT    318     Arthritis     Calculus of kidney     Calculus of ureter     CHF (congestive heart failure) (CMS-HCC)     Chronic atrial fibrillation (CMS-HCC) 07/20/2019    COPD (chronic obstructive pulmonary disease) (CMS-HCC)     Diabetes (CMS-HCC)     Gangrenous cholecystitis 10/11/2020    Generalized edema  06/17/2021    GERD (gastroesophageal reflux disease)     Hydronephrosis     Hypertension     Hyponatremia 10/11/2020    Intermediate coronary syndrome (CMS-HCC) 03/13/2014    Lower extremity edema 09/28/2020    Lumbar stenosis     Microscopic hematuria     Nausea alone     Nephrolithiasis 04/17/2016    Neuropathy     Nocturia     Nocturia 07/01/2017    Other chronic cystitis     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  See plans for anemia, AKI  We are also tapering her gabapentin.  Currently on 300 mg 3 times daily.  We will decrease it to twice daily, and then nightly.  She states she is not having any recurrence of her pain.    Pulmonary hypertension (CMS-HCC)     Renal colic     Shortness of breath 04/28/2020    Sleep apnea     Unstable angina pectoris (CMS-HCC) 03/13/2014    Social History     Tobacco Use    Smoking status: Former     Current packs/day: 0.00     Types: Cigarettes     Quit date: 2019     Years since quitting: 5.6    Smokeless tobacco: Never    Tobacco comments:     Quit a few years ago   Substance Use Topics    Alcohol use: No      Past Surgical History:   Procedure Laterality Date    BACK SURGERY  1995    CARPAL TUNNEL RELEASE Left 2014    HYSTERECTOMY  1971    IR INSERT CHOLECYSTOSMY TUBE PERCUTANEOUS  10/02/2020    IR INSERT CHOLECYSTOSMY TUBE PERCUTANEOUS 10/02/2020 Braulio Conte, MD IMG VIR [facility]    LUMBAR DISC SURGERY      PR REMOVAL GALLBLADDER N/A 10/06/2020    Procedure: CHOLECYSTECTOMY;  Surgeon: Katherina Mires, MD;  Location: MAIN OR [facility];  Service:  Trauma    PR RIGHT HEART CATH O2 SATURATION & CARDIAC OUTPUT N/A 09/30/2020    Procedure: Right Heart Catheterization;  Surgeon: Neal Dy, MD;  Location: [facility] CATH;  Service: Cardiology    PR RIGHT HEART CATH O2 SATURATION & CARDIAC OUTPUT N/A 01/09/2021    Procedure: Right Heart Catheterization;  Surgeon: Lesle Reek, MD;  Location: [facility] CATH;  Service: Cardiology    Family History Problem Relation Age of Onset    Hypertension Mother     Cancer Father         COLON CANCER    No Known Problems Sister     No Known Problems Sister     No Known Problems Brother     No Known Problems Brother     No Known Problems Maternal Aunt     No Known Problems Maternal Uncle     No Known Problems Paternal Aunt     No Known Problems Paternal Uncle     No Known Problems Maternal Grandmother     No Known Problems Maternal Grandfather     No Known Problems Paternal Grandmother     No Known Problems Paternal Grandfather     No Known Problems Other     Anesthesia problems Neg Hx     Broken bones Neg Hx     Clotting disorder Neg Hx     Collagen disease Neg Hx     Diabetes Neg Hx     Dislocations Neg Hx     Fibromyalgia Neg Hx     Gout Neg Hx     Hemophilia Neg Hx     Osteoporosis Neg Hx     Rheumatologic disease Neg Hx     Scoliosis Neg Hx     Severe sprains Neg Hx     Sickle cell anemia Neg Hx     Spinal Compression Fracture Neg Hx     GU problems Neg Hx     Kidney cancer Neg Hx     Prostate cancer Neg Hx         Nitrofurantoin and Lipitor [atorvastatin]     Objective Findings  Precautions / Restrictions  Falls precautions       Weight Bearing  Non-applicable    Required Braces or Orthoses  Non-applicable    Communication Preference  Verbal       Pain  No c/o pain this session    Equipment / Environment  Vascular access (PIV, TLC, Port-a-cath, PICC), Supplemental oxygen (3L O2 [place])    Living Situation  Living Environment: House  Lives With: Daughter, Family (Daughter, son in Social worker (both work))  Home Living: One level home, Walk-in shower, Handicapped height toilet, Grab bars in shower, Programmer, applications, Air traffic controller chair with back, Ramped entrance  Caregiver Identified?: Yes  Caregiver Availability: Nights  Caregiver Ability: Limited lifting  Equipment available at home: Rollator     Cognition   Orientation Level:  Oriented x 4   Arousal/Alertness:  Appropriate responses to stimuli   Attention Span:  Appears intact   Memory:  Appears intact   Following Commands:  Follows all commands and directions without difficulty   Safety Judgment:  Good awareness of safety precautions   Awareness of Errors and Problem Solving:  Patient self-corrected errors   Comments:      Vision / Hearing   Vision: Wears glasses all the time     Hearing: No deficit identified         Hand Function:  Right Hand Function: Right hand grip strength, ROM and coordination WNL  Left Hand Function: Left hand function impaired  Left Hand Impairment: grip strength fair  Hand Function comments: Pt wearing brace on L wrist, reports 2/2 twisting wrist when lifting heavy laundry deturgent bottle last week; did not assess wrist ROM for this reason  Hand Dominance: Right    Skin Inspection:  Skin Inspection: Intact where visualized    Face/Cervical ROM:  Face ROM: WFL  Cervical ROM: WFL    ROM / Strength:  UE ROM/Strength: Left WFL, Right WFL  LE ROM/Strength: Left WFL, Right WFL    Coordination:  Coordination: WFL    Sensation:  RUE Sensation: RUE intact  LUE Sensation: LUE intact  RLE Sensation: RLE intact  LLE Sensation: LLE intact    Balance:  Static Sitting-Level of Assistance: Independent  Dynamic Sitting-Level of Assistance: Independent  Sitting Balance comments: at recliner and EOB    Static Standing-Level of Assistance: Supervision  Dynamic Standing - Level of Assistance: Supervision  Standing Balance comments: using RW    Functional Mobility  Transfers:  (CGA sit>stand from recliner using RW, pt reporting recliner to be much lower than anything at home)  Bed Mobility - Needs Assistance: Modified Independent  Ambulation: Functional mobility into hallway with Distant Supervision using RW.    ADLs  ADLs: Supervision    Vitals / Orthostatics  Vitals/Orthostatics: NAD on 3L O2 [place] throughout session          Occupational Therapy Session Duration  OT Individual [mins]: 18       AM-PAC-Daily Activity  Lower Body Dressing assistance needs: A Little - Minimal/Contact Guard Assist/Supervision  Bathing assistance needs: A Little - Minimal/Contact Guard Assist/Supervision  Toileting assistance needs: None - Modified Independent/Independent  Upper Body Dressing assistance needs: None - Modified Independent/Independent  Personal Grooming assistance needs: None - Modified Independent/Independent  Eating Meals assistance needs: None - Modified Independent/Independent    Daily Activity Score: 22    Score (in points): % of Functional Impairment, Limitation, Restriction  6: 100% impaired, limited, restricted  7-8: At least 80%, but less than 100% impaired, limited restricted  9-13: At least 60%, but less than 80% impaired, limited restricted  14-19: At least 40%, but less than 60% impaired, limited restricted  20-22: At least 20%, but less than 40% impaired, limited restricted  23: At least 1%, but less than 20% impaired, limited restricted  24: 0% impaired, limited restricted      I attest that I have reviewed the above information.  Signed: Michelene Gardener [place], OT  [facility] 08/19/2023

## 2023-08-19 NOTE — Unmapped (Addendum)
Hospital Course  Barbara Huber is a 82 y.o. female whose presentation is complicated by [name], cardiac amyloidosis, pulmonary hypertension, chronic respiratory failure on 3 L, COPD, [name] stage IV, atrial fibrillation, DM II that presented to [facility] with shortness of breath found to be in an acute heart failure exacerbation. Transferred from [facility] back to [facility] with inadequate diuresis and continued shortness of breath. She improved with ongoing diuresis.    Acute on Chronic [name] Exacerbation - Grade II DD - Cardiac Amyloidosis - Type II Pulm HTN  Patient follows with cardiology outpatient in the cardiac amyloid clinic and has been seen in pulm HTN clinic as well. Previous diuretic regimen of 40 mg every morning of torsemide, but 20 mg torsemide in PM.  Last echocardiogram from 02/17/2023 with LVEF of greater than 70%, mildly increased wall of LV, dilated LA, mild pulmonary hypertension. On presentation patient more SOB, and found to have BNP of 342.57 (up from 118.62 from 07/14/23), some abdominal distension, elevated JVD and hepatojugular reflux with chest x-ray showing pulmonary edema with possible small bilateral effusions, consistent with heart failure exacerbation. Echocardiogram 08/19/23 with LVEF of 70%, moderate mitral annular calcification, moderately dilated LA, normal RV function. HF exacerbation thought to be in setting of decreased diuretic dosing for the past few days as well as some water retention with prednisone usage. Undewent diuresis (IV lasix 80, 120, IV bumex 3 mg, metolazone 5 mg) with UOP but limited. Patient's weight seemed to have downtrended (8/22 210 lbs --> 207 lbs) with unclear dry weight, and patient's lung exam improved from prior now without rales and she returned to her home oxygen requirements. Cardiology was consulted and recommended continuing torsemide 60 mg and spironolactone 25 mg daily and will set up follow up for patient in diuresis clinic. Recommend renal dosing of colchicine for acute gout flares.  Recommended that cardiology be consulted prior to initiating the patient on steroids of any kind given her history of [name] and cardiac amyloidosis.    Chronic Respiratory Failure - COPD - Pulmonary HTN   Patient with history of chronic respiratory failure on home 3 to 4 L daily.  Denies any cough, and no wheezing on presentation to suggest COPD as etiology to shortness of breath, and had finished course of steroids for gout close to admission which should help if due to COPD. Supposed to be repeating spirometry next spring 2025. Endorsed some benefit from CPAP 8/24.    Acute Kidney Injury on [name] Stage IV, Improved  Follows with nephrology outpatient. [name] thought to be due to diabetes and hypertension. Thought not to have amyloid infiltration of kidneys given the type of amyloid.  Baseline seems to be around 2.5-3 but with significant fluctuation. Presented with Cr of 3.12. Cr peaked at 4 on 8/23 and has been downtrending. Believed to be cardiorenal that improved with diuresis.    Troponin Elevation - Likely Type II MI   Patient found to have a initial high sensitive troponin of 103. Troponin peaked on 8/22 to 164 and began to downtrend.  Patient denied any chest pain.  EKG without frank signs of ischemia.  Elevation likely in the setting of heart failure exacerbation.     Hx of Persistent Atrial Fibrillation   CHA2DS2-VASc score of 5, however after anemia and concern for bleeding patient was not started on Eliquis.  Had a cardioversion back in 01/10/2021.  She remained in NSR.     Hx of Iron Deficiency  Has had iron infusions and  been placed on oral supplementation in the past. Repeat iron panel this admission still showing evidence of some iron deficiency. Continue oral iron supplementation on discharge.     Chronic Problems  Chronic Pain: Followed by the outpatient pain clinic [facility] in [place]. Takes OxyContin 12 mg 2 times a day, can continue current regimen inpatient with bowel regimen ordered.  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 In the setting of acute injury on top of [name], will manage with SSI for now. Discontinued SSI given average 140-140 and no SSI since start 8/21  Gout: Recent flare of left elbow and wrist. Treated with prednisone about two weeks ago. Not currently in flare at the moment. Can consider allopurinol initiation (limited by eGFR).

## 2023-08-19 NOTE — Unmapped (Addendum)
[facility] Daily Video Progress Note    Assessment/Plan:    Principal Problem:    Acute exacerbation of CHF (congestive heart failure) (CMS-HCC)  Active Problems:    Spinal stenosis of lumbar region    COPD (chronic obstructive pulmonary disease) (CMS-HCC)    Sleep apnea in adult    Essential hypertension    Type 2 diabetes mellitus with stage 4 chronic kidney disease, with long-term current use of insulin (CMS-HCC)    Peripheral neuropathy    Chronic respiratory failure with hypoxia (CMS-HCC)    Persistent atrial fibrillation (CMS-HCC)    Chronic kidney disease ([name]), stage IV (severe) (CMS-HCC)    Pulmonary hypertension (CMS-HCC)    Dyspnea on exertion    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)    Familial amyloid heart disease (CMS-HCC)                 Barbara Huber is a 82 y.o. female with a PMHx of [name], cardiac amyloidosis, pulmonary hypertension, chronic respiratory failure on 3 L, COPD, [name] stage IV, atrial fibrillation, DM II that presented to [facility] with shortness of breath and found to have Acute exacerbation of CHF (congestive heart failure) (CMS-HCC).    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 Per dtr, seems exacerbated by recent prednisone burst prescribed for gout flare.  -On ED eval, BNP of 342.57 (up from 118.62 from 07/14/23), mild abdominal distension, elevated JVD and hepatojugular reflux with chest x-ray showing some pulmonary edema with possible small bilateral effusions.   -Pt did not respond adequately to IV Lasix 80 mg (200 ml output) this morning nor 120 mg IV Lasix (300 ml output).   -Will transition to bumex 3mg  IV BID and CTM closely, adjust dose and frequency as needed. Will also give metolazone daily x 3 doses then likely transition back to prn. Continue spironolactone as well.  -Continue home tafamidis 61 mg daily (patient's family will bring tomorrow)  -Gets injections of amvuttra 25 mg every 3 months, last infused last month (07/14/2023)  -Jardiance Dc'ed due to recurrent UTIs  -Strict in and outs, daily weights, 2gm Na restriction  -Echocardiogram with grade 2 diastolic dysfxn, EF >70%     Troponin Elevation - Likely Type II MI   -Slight trend upward, suspect demand but will trend until peak  -EKG prn     Acute Kidney Injury on [name] Stage IV   -Baseline seems to be around 2.5, presented with Cr of 3.12.   -Likely some component due to cardiorenal physiology.   -CTM closely, daily BMP   -Diuresis as above  -Avoid nephrotoxic agents, renally dose meds     Chronic Respiratory Failure - COPD - Pulmonary HTN   -H/o chronic respiratory failure on home 3 to 4 L daily.   -Denies any cough, and no wheezing on exam to suggest COPD component to shortness of breath and even finished a recent course of steroids for gout flare.   -Continue home Advair and Spiriva, Albuterol as needed  -Management of heart failure exacerbation and pulm hypertension as above     Hx of Persistent Atrial Fibrillation   -CHA2DS2-VASc score of 5, however after anemia in the past and concern for bleeding patient was not started on Eliquis.   -S/p  cardioversion 01/10/2021. Seems to be in normal sinus rhythm on exam.  -Can continue amiodarone 200 mg daily     Hx of Iron Deficiency Anemia   -Has had iron infusions and been placed on oral supplementation in the past.   -CTM daily CBC    Chronic Pain:   -No acute issues.  -Continue home regimen along with bowel regimen.     Peripheral Neuropathy:   -Continue gabapentin 300 mg BID    HLD:   -Continue home Crestor     GERD:   -Continue home protonix 20 mg daily    DM II:   -Takes glargine 4 units nightly. Held on admission in favor of SSI, but will resume with hold parameters.     Gout:   -Recent flare of left elbow and wrist. Treated with prednisone about two weeks ago.   -Not currently in flare at the moment.   -Can consider allopurinol initiation (limited by eGFR).     VTE prophylaxis : [place] prediction score = 6 (bedrest for at least 3 days (3), age of at least 22 (1), heart or respiratory failure (1), and BMI of at least 30 (1)) >= 4 - will order chemoprophylaxis with heparin [place].      Estimated Date of Discharge: TBD    At this time, my patient has already crossed one medically necessary midnight of care.  Given need for ongoing care that, in my opinion, can only be delivered safely in the hospital, he/she will require additional hospital care which will cross a second medically necessary midnight prior to a safe discharge.    In-home provider that assisted with visit was Autumn, RRP.    I was outside the hospital [facility] Control).  I personally spent 35 minutes face-to-face and non-face-to-face in the care of this patient, which includes all pre, intra, and post visit time on the date of service.  All documented time was specific to the E/M visit and does not include any procedures that may have been performed.     The patient and/or parent/guardian has been advised of the potential risks and limitations of this mode of treatment (including, but not limited to, the absence of in-person examination) and has agreed to be treated using telemedicine. The  Patient's, patient's guardian's and/or patient's family's questions regarding telemedicine have been answered.     ___________________________________________________________________    Subjective:  No acute events overnight, patient reports improvement in SOB but still with DOE. Not much UOP with IV lasix. Unsteady gait.     Labs/Studies:  Labs and Studies from the last 24hrs per EMR and Reviewed, Echocardiogram Follow Up/Limited Echo    Result Date: 08/19/2023  Patient Info Name:     Barbara Huber Age:     82 years DOB:     [birth year] Gender:     Female MRN:     [id number] Accession #: [id number] UN Account #:     [id number] Ht:     168 cm Wt:     96 kg BSA:     2.15 m2 BP:     119 /     64 mmHg HR:     64 bpm Heart Rhythm:     Sinus Rhythm Exam Date:     08/19/2023 11:12 AM Admit Date:     08/18/2023 Exam Type:     ECHOCARDIOGRAM FOLLOW UP/LIMITED ECHO Technical Quality:     Fair Staff Sonographer:     Olam Idler Referring Physician:  None Per Patient Referring Reading Fellow:     Ma Hillock MD Study Info Indications      - HF exacerbation Procedure(s)   Limited 2D, color flow and Doppler transthoracic echocardiogram is performed. Summary   1. The left ventricular systolic function is hyperdynamic, LVEF is visually estimated at >70%.   2. There is grade II diastolic dysfunction (elevated filling pressure).   3. Speckled appearance of left ventricular myocardium is consistent with known cardiac amyloidosis.   4. Mitral annular calcification is present (moderate).   5. The aortic valve is trileaflet with mildly thickened leaflets with normal excursion.   6. The right ventricle is normal in size, with low normal systolic function. Left Ventricle   Speckled appearance of left ventricular myocardium is consistent with known cardiac amyloidosis. The left ventricular systolic function is hyperdynamic, LVEF is visually estimated at >70%. There is grade II diastolic dysfunction (elevated filling pressure). Right Ventricle   The right ventricle is normal in size, with low normal systolic function. Left Atrium   The left atrium is upper normal in size. Right Atrium   The right atrium is normal in size. Aortic Valve   The aortic valve is trileaflet with mildly thickened leaflets with normal excursion. There is no significant aortic regurgitation. There is no evidence of a significant transvalvular gradient. Mitral Valve   The mitral valve leaflets are normal with normal leaflet mobility. Mitral annular calcification is present (moderate). There is no significant mitral valve regurgitation. Tricuspid Valve   The tricuspid valve leaflets are normal, with normal leaflet mobility. There is trivial tricuspid regurgitation. The pulmonary systolic pressure cannot be estimated due to insufficient TR signal. Ventricles ---------------------------------------------------------------------- Name                                 Value        Normal ---------------------------------------------------------------------- LV Dimensions 2D/MM ----------------------------------------------------------------------  IVS Diastolic Thickness (2D)                                1.4 cm       0.6-0.9 LVID Diastole (2D)                  4.2 cm       3.8-5.2  LVPW Diastolic Thickness (2D)                                1.1 cm       0.6-0.9 LVID Systole (2D)                   2.6 cm       2.2-3.5 LV Mass Index (2D Cubed)           88 g/m2         43-95  Relative Wall Thickness (2D)                                  0.52               RV Dimensions 2D/MM ----------------------------------------------------------------------  RV Basal Diastolic Dimension                           4.3  cm       2.5-4.1 TAPSE                               1.8 cm         >=1.7 Atria ---------------------------------------------------------------------- Name                                 Value        Normal ---------------------------------------------------------------------- LA Dimensions ---------------------------------------------------------------------- LA Volume Index (4C A-L)        34.87 ml/m2               LA Volume Index (2C A-L)        16.12 ml/m2               LA Volume (BP MOD)                   53 ml               LA Volume Index (BP MOD)        24.63 ml/m2   16.00-34.00 RA Dimensions ---------------------------------------------------------------------- RA Area (4C)                      17.5 cm2        <=18.0 RA Area (4C) Index              8.1 cm2/m2               RA ESV Index (4C MOD)             23 ml/m2         15-27 Aortic Valve ---------------------------------------------------------------------- Name                                 Value        Normal ---------------------------------------------------------------------- AV Doppler ---------------------------------------------------------------------- AV Peak Velocity                   1.4 m/s               AV Peak Gradient                    8 mmHg Mitral Valve ---------------------------------------------------------------------- Name                                 Value        Normal ---------------------------------------------------------------------- MV Diastolic Function ---------------------------------------------------------------------- MV E Peak Velocity                 67 cm/s               MV A Peak Velocity                 71 cm/s               MV E/A                                 0.9               MV Annular TDI ---------------------------------------------------------------------- MV Septal e' Velocity  3.9 cm/s         >=8.0 MV E/e' (Septal)                      17.3               MV Lateral e' Velocity            4.5 cm/s        >=10.0 MV E/e' (Lateral)                     15.1               MV e' Average                     4.2 cm/s               MV E/e' (Average)                     16.2 Tricuspid Valve ---------------------------------------------------------------------- Name                                 Value        Normal ---------------------------------------------------------------------- TV Regurgitation Doppler ---------------------------------------------------------------------- TR Peak Velocity                   3.0 m/s Venous ---------------------------------------------------------------------- Name                                 Value        Normal ---------------------------------------------------------------------- IVC/SVC ---------------------------------------------------------------------- IVC Diameter (Insp 2D)              0.7 cm               IVC Diameter (Exp 2D)               2.6 cm         <=2.1  IVC Diameter Percent Change (2D)                                  73 %          >=50 Report Signatures Preliminary amended by Ma Hillock  MD on 08/19/2023 02:56 PM Resident Ma Hillock  MD on 08/19/2023 02:54 PM    ECG 12 Lead    Result Date: 08/19/2023  ACCELERATED JUNCTIONAL RHYTHM LEFT AXIS DEVIATION INFERIOR INFARCT POSSIBLE ANTERIOR INFARCT ABNORMAL ECG WHEN COMPARED WITH ECG OF 18-Aug-2023 16:29, ATRIAL FIBRILLATION IS NO LONGER PRESENT Confirmed by Mariane Baumgarten ([id number]) on 08/19/2023 2:12:40 PM    ECG 12 Lead    Result Date: 08/19/2023  POOR QUALITY TRACING Confirmed by Mariane Baumgarten ([id number]) on 08/19/2023 2:10:18 PM    XR Chest 2 views    Result Date: 08/18/2023  EXAM: XR CHEST 2 VIEWS DATE: 08/18/2023 4:59 PM ACCESSION: [id number] UN DICTATED: 08/18/2023 4:59 PM INTERPRETATION LOCATION: MAIN CAMPUS CLINICAL INDICATION: 82 years old Female with DYSPNEA ; Dyspnea  TECHNIQUE: Frontal and lateral views of the chest. COMPARISON: 02/14/2023 FINDINGS: Lung: Low lung volumes. Cephalization of the pulmonary vasculature and increased interstitial markings. Patchy bibasilar airspace opacity Pleura: Blunting of the bilateral costophrenic angles. No pneumothorax. Mediastinum: Cardiac silhouette is enlarged, similar to prior. Calcifications of the thoracic aorta.  Bones: There is loss of height of the lower thoracic vertebra.     Pulmonary edema with possible small effusions and bibasilar airspace opacity likely atelectasis and/or alveolar edema.  , and All lab results last 24 hours:    Recent Results (from the past 24 hour(s))   ECG 12 Lead    Collection Time: 08/18/23  4:29 PM   Result Value Ref Range    EKG Systolic BP  mmHg    EKG Diastolic BP  mmHg    EKG Ventricular Rate 86 BPM    EKG Atrial Rate 79 BPM    EKG P-R Interval  ms    EKG QRS Duration 120 ms    EKG Q-T Interval 386 ms    EKG QTC Calculation 461 ms    EKG Calculated P Axis degrees    EKG Calculated R Axis -65 degrees    EKG Calculated T Axis 37 degrees    QTC Fredericia 435 ms   Comprehensive Metabolic Panel    Collection Time: 08/18/23  5:39 PM   Result Value Ref Range    Sodium 134 (L) 135 - 145 mmol/L    Potassium 4.2 3.4 - 4.8 mmol/L    Chloride 99 98 - 107 mmol/L    CO2 23.9 20.0 - 31.0 mmol/L    Anion Gap 11 5 - 14 mmol/L    BUN 79 (H) 9 - 23 mg/dL    Creatinine 1.61 (H) 0.55 - 1.02 mg/dL    BUN/Creatinine Ratio 25     eGFR [name]-EPI (2021) Female 14 (L) >=60 mL/min/1.79m2    Glucose 239 (H) 70 - 179 mg/dL    Calcium 9.6 8.7 - 09.6 mg/dL    Albumin 3.7 3.4 - 5.0 g/dL    Total Protein 7.8 5.7 - 8.2 g/dL    Total Bilirubin 0.5 0.3 - 1.2 mg/dL    AST 26 <=04 U/L    ALT 11 10 - 49 U/L    Alkaline Phosphatase 181 (H) 46 - 116 U/L   hsTroponin I (serial 0-2-6H w/ delta)    Collection Time: 08/18/23  5:39 PM   Result Value Ref Range    hsTroponin I 103 (HH) <=34 ng/L   B-type Natriuretic Peptide    Collection Time: 08/18/23  5:39 PM   Result Value Ref Range    BNP 342.57 (H) <=100 pg/mL   CBC w/ Differential    Collection Time: 08/18/23  5:39 PM   Result Value Ref Range    WBC 12.8 (H) 3.6 - 11.2 10*9/L    RBC 4.45 3.95 - 5.13 10*12/L    HGB 13.2 11.3 - 14.9 g/dL    HCT 54.0 98.1 - 19.1 %    MCV 93.8 77.6 - 95.7 fL    MCH 29.7 25.9 - 32.4 pg    MCHC 31.7 (L) 32.0 - 36.0 g/dL    RDW 47.8 29.5 - 62.1 %    MPV 9.2 6.8 - 10.7 fL    Platelet 200 150 - 450 10*9/L    nRBC 0 <=4 /100 WBCs    Neutrophils % 82.5 %    Lymphocytes % 4.1 %    Monocytes % 12.0 %    Eosinophils % 0.8 %    Basophils % 0.6 %    Absolute Neutrophils 10.5 (H) 1.8 - 7.8 10*9/L    Absolute Lymphocytes 0.5 (L) 1.1 - 3.6 10*9/L    Absolute Monocytes 1.5 (H) 0.3 - 0.8 10*9/L    Absolute Eosinophils 0.1  0.0 - 0.5 10*9/L    Absolute Basophils 0.1 0.0 - 0.1 10*9/L   Magnesium Level    Collection Time: 08/18/23  5:39 PM   Result Value Ref Range    Magnesium 2.3 1.6 - 2.6 mg/dL   hsTroponin I - 2 Hour    Collection Time: 08/18/23 8:35 PM   Result Value Ref Range    hsTroponin I 132 (HH) <=34 ng/L    delta hsTroponin I 29 (HH) <=7 ng/L   Phosphorus Level    Collection Time: 08/18/23  8:35 PM   Result Value Ref Range    Phosphorus 4.5 2.4 - 5.1 mg/dL   POCT Glucose    Collection Time: 08/18/23 11:40 PM   Result Value Ref Range    Glucose, POC 183 (H) 70 - 179 mg/dL   hsTroponin I - 6 Hour    Collection Time: 08/18/23 11:46 PM   Result Value Ref Range    hsTroponin I 150 (HH) <=34 ng/L    delta hsTroponin I 18 (H) <=7 ng/L   ECG 12 Lead    Collection Time: 08/18/23 11:59 PM   Result Value Ref Range    EKG Systolic BP  mmHg    EKG Diastolic BP  mmHg    EKG Ventricular Rate 70 BPM    EKG Atrial Rate  BPM    EKG P-R Interval  ms    EKG QRS Duration 116 ms    EKG Q-T Interval 450 ms    EKG QTC Calculation 486 ms    EKG Calculated P Axis  degrees    EKG Calculated R Axis -74 degrees    EKG Calculated T Axis 45 degrees    QTC Fredericia 474 ms   Respiratory Pathogen Panel    Collection Time: 08/19/23  3:18 AM   Result Value Ref Range    Adenovirus Not Detected Not Detected    Coronavirus HKU1 Not Detected Not Detected    Coronavirus NL63 Not Detected Not Detected    Coronavirus 229E Not Detected Not Detected    Coronavirus OC43 PCR Not Detected Not Detected    Metapneumovirus Not Detected Not Detected    Rhinovirus/Enterovirus Not Detected Not Detected    Influenza A Not Detected Not Detected    Influenza B Not Detected Not Detected    Parainfluenza 1 Not Detected Not Detected    Parainfluenza 2 Not Detected Not Detected    Parainfluenza 3 Not Detected Not Detected    Parainfluenza 4 Not Detected Not Detected    RSV Not Detected Not Detected    Bordetella pertussis Not Detected Not Detected    Bordetella parapertussis Not Detected Not Detected    Chlamydophila (Chlamydia) pneumoniae Not Detected Not Detected    Mycoplasma pneumoniae Not Detected Not Detected    SARS-CoV-2 PCR Not Detected Not Detected   Urinalysis with Microscopy    Collection Time: 08/19/23  5:28 AM   Result Value Ref Range    Color, UA Light Yellow     Clarity, UA Clear     Specific Gravity, UA 1.011 1.003 - 1.030    pH, UA 5.0 5.0 - 9.0    Leukocyte Esterase, UA Small (A) Negative    Nitrite, UA Negative Negative    Protein, UA Negative Negative    Glucose, UA Negative Negative    Ketones, UA Negative Negative    Urobilinogen, UA <2.0 mg/dL <1.6 mg/dL    Bilirubin, UA Negative Negative    Blood, UA Trace (A) Negative  RBC, UA <1 <=4 /HPF    WBC, UA 3 0 - 5 /HPF    Squam Epithel, UA 2 0 - 5 /HPF    Bacteria, UA Many (A) None Seen /HPF    Hyaline Casts, UA 5 (H) 0 - 1 /LPF    Mucus, UA Rare (A) None Seen /HPF   Basic metabolic panel    Collection Time: 08/19/23  7:32 AM   Result Value Ref Range    Sodium 137 135 - 145 mmol/L    Potassium 3.9 3.4 - 4.8 mmol/L    Chloride 101 98 - 107 mmol/L    CO2 27.9 20.0 - 31.0 mmol/L    Anion Gap 8 5 - 14 mmol/L    BUN 87 (H) 9 - 23 mg/dL    Creatinine 2.95 (H) 0.55 - 1.02 mg/dL    BUN/Creatinine Ratio 28     eGFR [name]-EPI (2021) Female 14 (L) >=60 mL/min/1.62m2    Glucose 136 70 - 179 mg/dL    Calcium 9.1 8.7 - 62.1 mg/dL   Magnesium Level    Collection Time: 08/19/23  7:32 AM   Result Value Ref Range    Magnesium 2.2 1.6 - 2.6 mg/dL   Phosphorus Level    Collection Time: 08/19/23  7:32 AM   Result Value Ref Range    Phosphorus 5.2 (H) 2.4 - 5.1 mg/dL   Iron Panel    Collection Time: 08/19/23  7:32 AM   Result Value Ref Range    Iron 30 (L) 50 - 170 ug/dL    TIBC 308.6 (L) 578.4 - 425.0 ug/dL    Transferrin 696.2 (L) 250.0 - 380.0 mg/dL    Iron Saturation (%) 17 %   Ferritin    Collection Time: 08/19/23  7:32 AM   Result Value Ref Range    Ferritin 336.3 (H) 7.3 - 270.7 ng/mL   Uric acid    Collection Time: 08/19/23  7:32 AM   Result Value Ref Range    Uric Acid 17.2 (H) 3.1 - 7.8 mg/dL   CBC    Collection Time: 08/19/23  7:33 AM   Result Value Ref Range    WBC 9.7 3.6 - 11.2 10*9/L    RBC 3.92 (L) 3.95 - 5.13 10*12/L    HGB 11.8 11.3 - 14.9 g/dL    HCT 95.2 84.1 - 32.4 %    MCV 92.8 77.6 - 95.7 fL    MCH 30.2 25.9 - 32.4 pg    MCHC 32.6 32.0 - 36.0 g/dL    RDW 40.1 02.7 - 25.3 %    MPV 9.1 6.8 - 10.7 fL    Platelet 161 150 - 450 10*9/L   POCT Glucose    Collection Time: 08/19/23  7:49 AM   Result Value Ref Range    Glucose, POC 140 70 - 179 mg/dL   POCT Glucose    Collection Time: 08/19/23 11:43 AM   Result Value Ref Range    Glucose, POC 147 70 - 179 mg/dL   hsTroponin I (single, no delta)    Collection Time: 08/19/23 12:37 PM   Result Value Ref Range    hsTroponin I 164 (HH) <=34 ng/L       Objective:  Video quality: Good  Audio quality: Good    Temp:  [36.3 ??C (97.3 ??F)-36.4 ??C (97.5 ??F)] 36.4 ??C (97.5 ??F)  Heart Rate:  [59-90] 59  SpO2 Pulse:  [63-76] 63  Resp:  [18-22] 20  BP: (106-159)/(51-72) 119/64  SpO2:  [94 %-97 %] 95 %  Body mass index is 34.06 kg/m??.    Gen: No acute distress, DOE  HEENT: [place]/AT, EOMI, no scleral icterus, mucous membranes moist  Lungs: Respirations unlabored, no retractions or use of accessory muscles.  No audible cough or wheezing.  Per in-home provider, clear to ausculation bilaterally.  No wheezes/rales/rhonchi.  Heart: Per in-home provider, regular rate and rhythm  [place]: Per in-home provider, no tenderness noted, soft, non-distended, normal bowel sounds.  Extremities: No edema, no discoloration  Skin: No jaundice or petechiae.    Neurologic: Alert and oriented x3, speech clear, no facial droop, no tremor, moves all extremities

## 2023-08-19 NOTE — Unmapped (Signed)
Pt in with CHF.  VSS, no c/o pain.  Receiving IV lasix, tolerating without.  Difficulty.  Anticipate discharge today will continue to work towards prioritized goals.    Problem: Self-Care Deficit  Goal: Improved Ability to Complete Activities of Daily Living  Outcome: Progressing     Problem: Heart Failure  Goal: Optimal Coping  Outcome: Progressing       Problem: Adult Inpatient Plan of Care  Goal: Plan of Care Review  Outcome: Progressing   6CIT Score for Cognitive Screening:  Complete for all new admissions, scoring found on the back of the Kardex  If a patient has known dementia, no need to complete the 6CIT  [] Patient has dementia  [] Normal 6CIT (<8)    [] Significant 6CIT (8 or higher)  [] Needs to be completed    CAM Delirium Screening Tool  Be sure to ask questions to gauge orientation and attention during your assessment  Feature Present? Scoring: CAM Positive if:   Acute onset (different from baseline) with Fluctuating course [] Yes    [x] No   Yes to BOTH of these AND.Marland KitchenMarland Kitchen   Inattention (days of the week or months of the year backward) [] Yes    [x] No    Disorganized thinking [] Yes    [x] No   Yes to at least   ONE of these   Altered level of consciousness  [] Yes    [x] No    CAM []  Positive  [x] Negative    CAM-S: Symptom Severity Score  Complete on all patients ?82 years old, including in patients with dementia  Do not complete if the patient is known to be non-verbal due to their dementia  Feature  Severity Score  Question   ACUTE ONSET  & FLUCTUATING  COURSE [x] No (0)    [] Yes (1)    Is there evidence of an acute change in mental status from the patient's baseline? Did the patient's behavior fluctuate at any point during the interview for any of the 10 features?   2. INATTENTION [x] No (0)    [] Yes, mild (1)  [] Yes, marked (2)  Did the patient have difficulty focusing attention, for example being easily distractible, or having difficulty keeping track of what was being said?   3. DISORGANIZED  THINKING [x] No (0) [] Yes, mild (1)  [] Yes, marked (2)  Was the patient's thinking disorganized or incoherent, such as rambling or irrelevant conversation, unclear or illogical flow or of ideas, unpredictable switching from subject to subject?   4. ALTERED LEVEL  OF  CONSCIOUSNESS [x] Normal (0)    [] Mild: vigilant or lethargic (1)  [] Marked: stupor or coma (2)    Overall, how would you rate the patient's level of consciousness?   -Alert (normal)   -Vigilant  -Lethargic  -Stupor  -Coma  -Uncertain   5. DISORIENTATION [x] No (0)    [] Yes, mild (1)  [] Yes, marked (2)  Was the patient disoriented at any time during the interview, such as thinking he/she was somewhere other than the hospital, using the wrong bed, or misjudging the time of day?   6. MEMORY  IMPAIRMENT [x] No (0)    [] Yes, mild (1)  [] Yes, marked (2)    Did the patient demonstrate any memory problems during the interview, such as inability to remember events in the hospital or difficulty remembering instructions?   7. PERCEPTUAL  DISTURBANCES [x] No (0)    [] Yes, mild (1)  [] Yes, marked (2)  Did the patient have any evidence of perceptual disturbances, for example, hallucinations, illusions, or misinterpretations (such  as thinking something was moving when it was not)?   8. PSYCHOMOTOR  AGITATION [x] No (0)    [] Yes, mild (1)  [] Yes, marked (2)  At any time during the interview, did the patient have an unusually increased level of motor activity, such as restlessness, picking at bedclothes, tapping fingers, or making frequent sudden changes of position?   9. PSYCHOMOTOR  RETARDATION [x] No (0)    [] Yes, mild (1)  [] Yes, marked (2)  At any time during the interview, did the patient have an unusually decreased level of motor activity, such as sluggishness, staring into space, staying in one position for a long time, or moving very slowly?   10. ALTERED  SLEEP-WAKE  CYCLE  [x] No (0)    [] Yes, mild (1)  [] Yes, marked (2)  Did the patient have evidence of disturbance of the sleep-wake cycle, such as excessive daytime sleepiness with insomnia at   night?   Long Form   SEVERITY SCORE:  Severity Score (add rows 1-10)  Total (0-19): 0    Scoring the CAM-S: Rate each symptom of delirium listed in the CAM instrument as absent (0), mild (1), marked (2). Acute onset or fluctuation is rated as absent (0) or present (1). Summarize these scores into a composite.

## 2023-08-19 NOTE — Unmapped (Signed)
[name]    Assessment & Plan:   Barbara Huber is a 82 y.o. female with a PMHx of [name], cardiac amyloidosis, pulmonary hypertension, chronic respiratory failure on 3 L, COPD, [name] stage IV, atrial fibrillation, DM II that presented to [facility] with shortness of breath and found to be in a heart failure exacerbation.    Principal Problem:    Acute exacerbation of CHF (congestive heart failure) (CMS-HCC)  Active Problems:    Spinal stenosis of lumbar region    COPD (chronic obstructive pulmonary disease) (CMS-HCC)    Sleep apnea in adult    Essential hypertension    Type 2 diabetes mellitus with stage 4 chronic kidney disease, with long-term current use of insulin (CMS-HCC)    Peripheral neuropathy    Chronic respiratory failure with hypoxia (CMS-HCC)    Persistent atrial fibrillation (CMS-HCC)    Chronic kidney disease ([name]), stage IV (severe) (CMS-HCC)    Pulmonary hypertension (CMS-HCC)    Dyspnea on exertion    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)    Familial amyloid heart disease (CMS-HCC)    Active Problems:     Acute on Chronic [name] Exacerbation - Grade II DD - 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 Previous diuretic regimen of 40 mg every morning of torsemide, but 20 mg torsemide in PM. On presentation, patient endorsed having more shortness of breath that worsened over the past couple of day. Patient was found to have a BNP of 342.57 (up from 118.62 from 07/14/23), some mild abdominal distension, elevated JVD and hepatojugular reflux with chest x-ray showing some pulmonary edema with possible small bilateral effusions. Presentation most concerning for heart exacerbation likely in the setting of decreased diuretic dosing for the past few days as well as some water retention with prednisone usage. Pt did not respond adequately to IV Lasix 80 mg (200 ml output) this morning nor 120 mg IV Lasix (300 ml output). Consider adding thiazide to supplement loop diuretic until pt responds. Will need to balance this with recent flare of gout and elevated uric acid to 17.  - Can continue home tafamidis 61 mg daily (patient's family will bring tomorrow)  - Gets injections of amvuttra 25 mg every 3 months, last infused last month (07/14/2023)  - can continue spironolactone 25 mg daily (not on jardiance due to hx of recurrent UTI's)  - continue to diurese until adequate urinary output  - strict in and outs  - daily weights  - 2 gm Na restriction  - Echocardiogram read pending  - K >4, Mg >2   - BMP, Mg daily    Troponin Elevation - Likely Type II MI   Patient found to have a initial high sensitive troponin of 103 uptrending to 132 and now 164.  Patient denied any chest pain.  EKG without frank signs of ischemia. Elevation likely in the setting of heart failure exacerbation. Troponin has not peaked, so will continue to trend.  - Trend troponin to peak  - Serial EKGs    Acute Kidney Injury on [name] Stage IV   Follows with nephrology outpatient. Thought to be due to diabetes and hypertension. Thought not to have amyloid infiltration of kidneys given the type of amyloid. Baseline seems to be around 2.5-3. Presented with Cr of 3.12. Likely some component due to cardiorenal physiology. UA with some bacteria, but  pt asx aside from decreased urinary output.   - daily BMP   - diuresis as above  - post void residual/bladder scan, pending  - consider renal US if not improving  - avoid nephrotoxic agents    Chronic Respiratory Failure - COPD - Pulmonary HTN   Patient with history of chronic respiratory failure on home 3 to 4 L daily. Patient was feeling more short of breath over the past week with exam finding concerning for heart failure exacerbation. Denies any cough, and no wheezing on exam to suggest COPD component to shortness of breath and even finished a recent course of steroids for gout flare. Supposed to be repeating spirometry next spring 2025.  - Continue home formulation of Advair and Spiriva  - Albuterol as needed  - Management of heart failure exacerbation and pulm hypertension as above    Hx of Persistent Atrial Fibrillation   CHA2DS2-VASc score of 5, however after anemia in the past and concern for bleeding patient was not started on Eliquis. Had a cardioversion back in 01/10/2021. Seems to be in normal sinus rhythm on exam.  - Can continue amiodarone 200 mg daily    Hx of Iron Deficiency Anemia   Has had iron infusions and been placed on oral supplementation in the past. Iron panel with: Iron - 30, Transferrin - 138, TIBC - 173.9, and Ferritin - 336.3. Hgb normal.   - monitor daily CBC        Mentation CAM: CAM: Negative  CAM-S:   6CIT: Not yet completed (please touch base with nursing)  Delirium Order Set: Ordered (appropriate for any patient >65)  Dementia: No.  Behavioral Symptoms (baseline or now): No.   Mobility Baseline functional status: Independent in ADLs  Current functional status: Independent in ADLs  PT/OT Ordered: Yes   Medications Reviewed home medications, screening for potentially inappropriate medications: Yes  Medications recommended de-prescribing: n/a   What Matters Geri Assessment complete: No, deemed unnecessary     Chronic Problems:  Chronic Pain: Followed by the outpatient pain clinic [facility] in [place]. Takes OxyContin 12 mg 2 times a day, can continue current regimen inpatient with bowel regimen ordered.  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  In the setting of acute injury on top of [name], will manage with SSI for now.  Volume limiting p.o. intake for now.  Gout: Recent flare of left elbow and wrist. Treated with prednisone about two weeks ago. Not currently in flare at the moment. Can consider allopurinol initiation (limited by eGFR).     Daily Checklist:  Diet: Sodium Restricted (2g)  DVT PPx: Heparin 5000units q8h  Electrolytes: Replete Potassium to >/=4 and Magnesium to >/=2  Code Status: Full Code  Dispo: Home    Team Contact Information:   Primary Team: Geriatrics ([facility])  Primary Resident: Tally Due, MD  Resident's Pager: [phone] (Geriatrics Intern - Tower)    Interval History:   Pt reports that she is comfortable this morning. Had no concerns. Says she has not been peeing much. Denies any chest pain or continued shortness of breath.    Objective:   Temp:  [36.3 ??C (97.3 ??F)-36.4 ??C (97.5 ??F)] 36.4 ??C (97.5 ??F)  Heart Rate:  [59-90] 59  SpO2 Pulse:  [63-76] 63  Resp:  [18-22] 20  BP: (106-159)/(51-72)  119/64  SpO2:  [94 %-97 %] 95 %    Gen: WDWN female in NAD, answers questions appropriately  Eyes: sclera anicteric, EOMI  HENT: atraumatic, MMM, OP w/o erythema or exudate   Heart: RRR, no systolic murmur, elevated JVD to mandible, +hepatojugular reflux   Lungs: CTAB, no crackles or wheezes, no use of accessory muscles, on 3L  (baseline)  Abdomen: Normoactive bowel sounds, soft, mildly distended, no rebound/guarding  Extremities: no clubbing, cyanosis, or edema in the BLEs, L wrist in brace, L elbow with some effusion, non erythematous or warm to touch  Psych: Alert, oriented x4, appropriate mood and affect    Labs/Studies: Labs and Studies from the last 24hrs per EMR and Reviewed    Corinne Ports, MS4    I attest that I have reviewed the medical student note and that the components of the history of the present illness, the physical exam, and the assessment and plan documented were performed by me or were performed in my presence by the student where I verified the documentation and performed (or re-performed) the exam and medical decision making.     Tally Due, MD PhD  PGY-1 Internal Medicine  [facility]

## 2023-08-19 NOTE — Unmapped (Shared)
[facility]  Emergency Department Provider Note     HPI     Barbara Huber is a very pleasant 82 y.o. female  has a past medical history of Acute kidney injury superimposed on chronic kidney disease (CMS-HCC) (10/11/2020), Acute on chronic diastolic (congestive) heart failure (CMS-HCC) (08/23/2020), Acute on chronic diastolic congestive heart failure (CMS-HCC), AKI (acute kidney injury) (CMS-HCC) (04/14/2015), Anemia (08/22/2019), Arthritis, Calculus of kidney, Calculus of ureter, CHF (congestive heart failure) (CMS-HCC), Chronic atrial fibrillation (CMS-HCC) (07/20/2019), COPD (chronic obstructive pulmonary disease) (CMS-HCC), Diabetes (CMS-HCC), Gangrenous cholecystitis (10/11/2020), Generalized edema  (06/17/2021), GERD (gastroesophageal reflux disease), Hydronephrosis, Hypertension, Hyponatremia (10/11/2020), Intermediate coronary syndrome (CMS-HCC) (03/13/2014), Lower extremity edema (09/28/2020), Lumbar stenosis, Microscopic hematuria, Nausea alone, Nephrolithiasis (04/17/2016), Neuropathy, Nocturia, Nocturia (07/01/2017), Other chronic cystitis, 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    The patient presents to the ED today for evaluation of fatigue. The patient reports 4 days of increasing generalized fatigue and weakness with associated increased DOE and chills. She adheres to torsemide 20mg  TID, but she has been taking a decreased dose of 40mg  daily over the past 2 days. She requires 3L O2 at baseline. Of note, she recently adhered to a course of prednisone, which she finished about 1 week ago. Patients daughter at bedside notes that the patient has experienced similar symptoms in the past 2/2 fluid overload and AKI. She further notes the patient has had decreased PO intake over the past few days. Patient is not anticoagulated. She denies fevers, chest pain, nausea, vomiting, or dysuria.     MDM          The case was discussed with the attending physician who is in agreement with the above assessment and plan.    - Any discussion of this patient's case/presentation between myself and consultants, admitting teams, or other team members has been documented above.  - Imaging and other studies, if performed, that were available during my care of the patient were independently reviewed and interpreted by me and considered in my medical decision making as documented above.  - External records reviewed: ***  - Consideration of admission, observation, transfer, or escalation of care: ***     ED Clinical Impression     Final diagnoses:   None        Physical Exam     Vitals:    08/18/23 1627   BP: 129/64   Pulse: 90   Resp: 18   Temp: 36.3 ??C (97.3 ??F)   TempSrc: Oral   SpO2: 94%      Constitutional: Well-appearing, NAD. ***  Eyes: Conjunctivae are normal. EOMI.   HEENT: Normocephalic and atraumatic. Mucous membranes are moist.   Neck: Full active ROM  Cardiovascular: See heart rate listed above.  No murmurs appreciated.  Extremities are warm and well-perfused.  Respiratory: See respiratory rate listed above.  Lungs are CTA b/l.  Speaking easily in full sentences without audible stridor or wheezing.  Equal chest rise without increased work of breathing.  Gastrointestinal: Abdomen is soft, non-distended, non-tender.  No guarding.  Musculoskeletal: No long bone deformities.  No lower extremity edema.   Neurologic: Normal speech and language. No gross focal neurologic deficits are appreciated.   Skin: Skin is warm, dry.  Psychiatric: Mood and affect are normal.     Past History     PAST MEDICAL HISTORY/PAST SURGICAL HISTORY:   Past Medical History:   Diagnosis Date  Acute kidney injury superimposed on chronic kidney disease (CMS-HCC) 10/11/2020    Acute on chronic diastolic (congestive) heart failure (CMS-HCC) 08/23/2020    Acute on chronic diastolic congestive heart failure (CMS-HCC)     AKI (acute kidney injury) (CMS-HCC) 04/14/2015    Lab Results  Component  Value  Date     CREATININE  1.90 (H)  06/12/2021     Had a bump in her creatinine when she was taking her diuretics every day.  She is currently taking 40 mg daily of torsemide and 50 mg of spironolactone.  Her volume status is fragile.  Previously when she stopped her diuretic she becomes short of breath.  Plan: We will check her BMP today.  We will likely have to go to 40    Anemia 08/22/2019    Iron deficiency anemia          Lab Results      Component    Value    Date           WBC    9.2    03/21/2021           RBC    3.67 (L)    03/21/2021           HGB    9.9 (L)    03/21/2021           HCT    30.7 (L)    03/21/2021           MCV    83.7    03/21/2021           MCH    26.9    03/21/2021           MCHC    32.1    03/21/2021           RDW    21.9 (H)    03/21/2021           PLT    318     Arthritis     Calculus of kidney     Calculus of ureter     CHF (congestive heart failure) (CMS-HCC)     Chronic atrial fibrillation (CMS-HCC) 07/20/2019    COPD (chronic obstructive pulmonary disease) (CMS-HCC)     Diabetes (CMS-HCC)     Gangrenous cholecystitis 10/11/2020    Generalized edema  06/17/2021    GERD (gastroesophageal reflux disease)     Hydronephrosis     Hypertension     Hyponatremia 10/11/2020    Intermediate coronary syndrome (CMS-HCC) 03/13/2014    Lower extremity edema 09/28/2020    Lumbar stenosis     Microscopic hematuria     Nausea alone     Nephrolithiasis 04/17/2016    Neuropathy     Nocturia     Nocturia 07/01/2017    Other chronic cystitis     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  See plans for anemia, AKI  We are also tapering her gabapentin.  Currently on 300 mg 3 times daily.  We will decrease it to twice daily, and then nightly.  She states she is not having any recurrence of her pain.    Pulmonary hypertension (CMS-HCC)     Renal colic     Shortness of breath 04/28/2020    Sleep apnea     Unstable angina pectoris (CMS-HCC) 03/13/2014       Past Surgical History:   Procedure Laterality Date    BACK SURGERY  1995    CARPAL TUNNEL RELEASE Left 2014    HYSTERECTOMY  1971    IR INSERT CHOLECYSTOSMY TUBE PERCUTANEOUS  10/02/2020    IR INSERT CHOLECYSTOSMY TUBE PERCUTANEOUS 10/02/2020 Braulio Conte, MD IMG VIR [facility]    LUMBAR DISC SURGERY      PR REMOVAL GALLBLADDER N/A 10/06/2020    Procedure: CHOLECYSTECTOMY;  Surgeon: Katherina Mires, MD;  Location: MAIN OR [facility];  Service: Trauma    PR RIGHT HEART CATH O2 SATURATION & CARDIAC OUTPUT N/A 09/30/2020    Procedure: Right Heart Catheterization;  Surgeon: Neal Dy, MD;  Location: [facility] CATH;  Service: Cardiology    PR RIGHT HEART CATH O2 SATURATION & CARDIAC OUTPUT N/A 01/09/2021    Procedure: Right Heart Catheterization;  Surgeon: Lesle Reek, MD;  Location: [facility] CATH;  Service: Cardiology       MEDICATIONS:   No current facility-administered medications for this encounter.    Current Outpatient Medications:     acetaminophen (TYLENOL) 500 MG tablet, Take 2 tablets (1,000 mg total) by mouth Three (3) times a day., Disp: , Rfl:     albuterol HFA 90 mcg/actuation inhaler, Inhale 2 puffs every eight (8) hours as needed for wheezing., Disp: , Rfl:     amiodarone (PACERONE) 200 MG tablet, TAKE ONE TABLET BY MOUTH ONCE DAILY, Disp: 90 tablet, Rfl: 3    blood sugar diagnostic (ACCU-CHEK GUIDE TEST STRIPS) Strp, Use to check blood sugar daily as instructed by clinic. Dx E11.9 For daily testing, Disp: 100 strip, Rfl: 3    blood-glucose meter kit, Use as instructed Daily testing, Disp: 1 each, Rfl: 0    cranberry 500 mg cap, Take 500 mg by mouth daily with evening meal., Disp: , Rfl:     cyclobenzaprine (FLEXERIL) 5 MG tablet, Take 1.5 tablets (7.5 mg total) by mouth two (2) times a day as needed for muscle spasms., Disp: 90 tablet, Rfl: 1    ergocalciferol-1,250 mcg, 50,000 unit, (DRISDOL) 1,250 mcg (50,000 unit) capsule, Take 1 capsule (1,250 mcg total) by mouth once a week., Disp: 12 capsule, Rfl: 3    estradioL (ESTRACE) 0.01 % (0.1 mg/gram) vaginal cream, Insert 2 g into the vagina Two (2) times a week., Disp: 42.5 g, Rfl: 3    ferrous sulfate 325 (65 FE) MG EC tablet, Take 1 tablet (325 mg total) by mouth Every Monday, Wednesday, and Friday., Disp: 36 tablet, Rfl: 3    fluticasone propion-salmeterol (ADVAIR HFA) 115-21 mcg/actuation inhaler, Inhale 2 puffs two (2) times a day., Disp: 12 g, Rfl: 11    fosfomycin (MONUROL) 3 gram Pack, , Disp: , Rfl:     gabapentin (NEURONTIN) 300 MG capsule, Take 1 capsule (300 mg total) by mouth two (2) times a day., Disp: 180 capsule, Rfl: 3    insulin glargine (BASAGLAR, LANTUS) 100 unit/mL (3 mL) injection pen, Inject 0.04 mL (4 Units total) under the skin nightly., Disp: , Rfl:     lancets Misc, 1 each by Miscellaneous route daily. Accu Check, Disp: 100 each, Rfl: 6    methylPREDNISolone (MEDROL DOSEPACK) 4 mg tablet, follow package directions, Disp: 1 each, Rfl: 0    metOLazone (ZAROXOLYN) 5 MG tablet, Take 1 tablet (5 mg total) by mouth daily as needed (when instructed by cardiology clinic)., Disp: , Rfl:     montelukast (SINGULAIR) 10 mg tablet, Take 1 tablet (10 mg total) by mouth every morning., Disp: 90 tablet, Rfl: 2    NARCAN 4 mg/actuation  nasal spray, 1 spray into alternating nostrils once as needed (opioid overdose). PRN - Emergency use., Disp: , Rfl:     NON FORMULARY, APPLY FROM NECK DOWN TWICE A DAY, Disp: , Rfl:     OXYCONTIN 10 mg TR12 12 hr crush resistant ER/CR tablet, Take 1 tablet (10 mg total) by mouth every twelve (12) hours., Disp: , Rfl:     OXYGEN-AIR DELIVERY SYSTEMS MISC, 5 L by Miscellaneous route. Currently using 3  L/min via Coulter, Disp: , Rfl:     pantoprazole (PROTONIX) 20 MG tablet, TAKE 1 TABLET(20 MG) BY MOUTH DAILY, Disp: 90 tablet, Rfl: 3    pen needle, diabetic (BD ULTRA-FINE SHORT PEN NEEDLE) 31 gauge x 5/16 (8 mm) Ndle, Give 4 units of insulin each night, Disp: 100 each, Rfl: 1    rosuvastatin (CRESTOR) 5 MG tablet, Take 1 tablet (5 mg total) by mouth every other day., Disp: 15 tablet, Rfl: 11    spironolactone (ALDACTONE) 25 MG tablet, TAKE 1 TABLET(25 MG) BY MOUTH DAILY, Disp: 90 tablet, Rfl: 3    tafamidis (VYNDAMAX) 61 mg cap, Take 1 capsule (61 mg) by mouth daily., Disp: 30 capsule, Rfl: 11    tiotropium bromide (SPIRIVA RESPIMAT) 2.5 mcg/actuation inhalation mist, Inhale 2 puffs daily., Disp: 4 g, Rfl: 11    torsemide (DEMADEX) 20 MG tablet, Take 3 tablets (60 mg total) by mouth daily., Disp: 270 tablet, Rfl: 3    vitamin A-3,000 mcg RAE, 10,000 UNIT, 3,000 mcg RAE (10,000 UNIT) capsule, Take 1 capsule (3,000 mcg of RAE total) by mouth daily., Disp: , Rfl:     vutrisiran (AMVUTTRA) 25 mg/0.5 mL injection, Inject 0.5 mL (25 mg total) under the skin once. Every 12 Weeks, Disp: , Rfl:     ALLERGIES:   Nitrofurantoin and Lipitor [atorvastatin]    SOCIAL HISTORY:   Social History     Tobacco Use    Smoking status: Former     Current packs/day: 0.00     Types: Cigarettes     Quit date: 2019     Years since quitting: 5.6    Smokeless tobacco: Never    Tobacco comments:     Quit a few years ago   Substance Use Topics    Alcohol use: No       FAMILY HISTORY:  Family History   Problem Relation Age of Onset    Hypertension Mother     Cancer Father         COLON CANCER    No Known Problems Sister     No Known Problems Sister     No Known Problems Brother     No Known Problems Brother     No Known Problems Maternal Aunt     No Known Problems Maternal Uncle     No Known Problems Paternal Aunt     No Known Problems Paternal Uncle     No Known Problems Maternal Grandmother     No Known Problems Maternal Grandfather     No Known Problems Paternal Grandmother     No Known Problems Paternal Grandfather     No Known Problems Other     Anesthesia problems Neg Hx     Broken bones Neg Hx     Clotting disorder Neg Hx     Collagen disease Neg Hx Diabetes Neg Hx     Dislocations Neg Hx     Fibromyalgia Neg Hx     Gout Neg Hx  Hemophilia Neg Hx     Osteoporosis Neg Hx     Rheumatologic disease Neg Hx     Scoliosis Neg Hx     Severe sprains Neg Hx     Sickle cell anemia Neg Hx     Spinal Compression Fracture Neg Hx     GU problems Neg Hx     Kidney cancer Neg Hx     Prostate cancer Neg Hx          Radiology     XR Chest 2 views   Final Result   Pulmonary edema with possible small effusions and bibasilar airspace opacity likely atelectasis and/or alveolar edema.              Laboratory Data     Lab Results   Component Value Date    WBC 12.8 (H) 08/18/2023    HGB 13.2 08/18/2023    HCT 41.8 08/18/2023    PLT 200 08/18/2023       Lab Results   Component Value Date    NA 134 (L) 08/18/2023    K 4.2 08/18/2023    CL 99 08/18/2023    CO2 23.9 08/18/2023    BUN 79 (H) 08/18/2023    CREATININE 3.12 (H) 08/18/2023    GLU 239 (H) 08/18/2023    CALCIUM 9.6 08/18/2023    MG 2.3 07/14/2023    PHOS 5.0 12/14/2021       Lab Results   Component Value Date    BILITOT 0.5 08/18/2023    BILIDIR 0.10 05/13/2022    PROT 7.8 08/18/2023    ALBUMIN 3.7 08/18/2023    ALT 11 08/18/2023    AST 26 08/18/2023    ALKPHOS 181 (H) 08/18/2023    GGT 215 (H) 09/29/2020       Lab Results   Component Value Date    INR 2.15 07/01/2021    APTT 46.9 (H) 01/10/2021       Portions of this record have been created using [employer]. Dictation errors have been sought, but may not have been identified and corrected.    Documentation assistance was provided by Johnathan Hausen, Scribe, on August 18, 2023 at 6:23 PM for Kristopher Glee, DO.    {*** NOTE TO PROVIDER: PLEASE ADD EDPROVSCRIBEATTEST NOTING YOU AGREE WITH SCRIBE DOCUMENTATION}

## 2023-08-19 NOTE — Unmapped (Signed)
Clinical Pharmacy Non-Formulary Home Medication Evaluation Note     Team is requesting continuation of the patient's non-formulary home medication tafamidis. Per PolicyStat ID: [id number] (Medication Management: Patient-Supplied Medications,) the continued use of these medications during hospital admission was evaluated by pharmacy.      Recommendation: Continuation of non-formulary home medication tafamidis is clinically necessary and will be identified by pharmacy for continuation. Per policy, patient-supplied medications must be supplied by the patient for the duration of the encounter.     Rationale: This medication was approved for inpatient use by Lillette Boxer (pharmacy administration). This approval is good for 6 months and expires on 02/19/24     This information has been communicated to the ordering clinician. Pharmacy will continue to follow patient with team.  Please page the service pharmacist pager found in the [facility] Directory if further immediate assistance is required.    Thank you,     Truddie Coco, Pharm D, BCPS, BCGP

## 2023-08-19 NOTE — Unmapped (Signed)
Geriatrics ([facility]) History & Physical    Assessment & Plan:   Barbara Huber is a 82 y.o. female whose presentation is complicated by [name], cardiac amyloidosis, pulmonary hypertension, chronic respiratory failure on 3 L, COPD, [name] stage IV, atrial fibrillation, DM II that presented to [facility] with shortness of breath found to be in a heart failure exacerbation.     Principal Problem:    Acute exacerbation of CHF (congestive heart failure) (CMS-HCC)  Active Problems:    Spinal stenosis of lumbar region    COPD (chronic obstructive pulmonary disease) (CMS-HCC)    Sleep apnea in adult    Essential hypertension    Type 2 diabetes mellitus with stage 4 chronic kidney disease, with long-term current use of insulin (CMS-HCC)    Peripheral neuropathy    Chronic respiratory failure with hypoxia (CMS-HCC)    Persistent atrial fibrillation (CMS-HCC)    Chronic kidney disease ([name]), stage IV (severe) (CMS-HCC)    Pulmonary hypertension (CMS-HCC)    Dyspnea on exertion    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)    Familial amyloid heart disease (CMS-HCC)    Active Problems    Acute on Chronic [name] Exacerbation - Grade II DD - Cardiac Amyloidosis - Type II Pulm HTN  Patient follows with cardiology outpatient in the cardiac amyloid clinic.  Previous diuretic regimen of 40 mg every morning of torsemide, but 20 mg torsemide in PM.  Last echocardiogram from 02/17/2023 with LVEF of greater than 70%, mildly increased wall of LV, dilated LA, mild pulmonary hypertension.  Has been seen in the pulmonary hypertension clinic as well. On presentation, patient endorsed having more shortness of breath that worsened over the past couple of day. Patient was found to have a BNP of 342.57 (up from 118.62 from 07/14/23), some mild abdominal distension, elevated JVD and hepatojugular reflux with chest x-ray showing some pulmonary edema with possible small bilateral effusions. Presentation most concerning for heart exacerbation.  Likely in the setting of decreased diuretic dosing for the past few days as well as some water retention with prednisone usage. Considered ischemic event with elevated troponins, but denies any anginal equivalents over the preceding week besides SOB. Can consider PE as another etiology for why she is SOB, but with physical exam and lab findings, seems more consistent with HF.   - Can continue home tafamidis 61 mg daily (patient's family will bring tomorrow)  - Gets injections of amvuttra 25 mg every 3 months, last infused last month (07/14/2023)  - can continue spironolactone 25 mg daily (not on jardiance due to hx of recurrent UTI's)  - RPP to rule out viral etiology of symptoms  - s/p diuresis with IV lasix 60, will trial IV lasix 80 mg   - strict in and outs  - daily weights  - Echocardiogram ordered  - K >4, Mg >2     Troponin Elevation - Likely Type II MI   Patient found to have a initial high sensitive troponin of 103 uptrending to 132.  Patient denied any chest pain.  EKG without frank signs of ischemia.  Elevation likely in the setting of heart failure exacerbation.   - Trend troponin to peak  - Serial EKGs    Acute Kidney Injury on [name] Stage IV  Follows with nephrology outpatient.  Thought to be due to diabetes and hypertension.  Thought not to have amyloid infiltration of kidneys given the type of amyloid.  Baseline seems to be around 2.5-3, however had  been closer to 2.5 previously. Presented with Cr of 3.12. Likely some component due to cardiorenal physiology.   - daily BMP   - diuresis as above  - post void residual/bladders can   - consider renal US if not improving  - urinalysis  - avoid nephrotoxic agents    Chronic Respiratory Failure - COPD - Pulmonary HTN   Patient with history of chronic respiratory failure on home 3 to 4 L daily.  Patient was feeling more short of breath over the past week with exam finding concerning for heart failure exacerbation.  Denies any cough, and no wheezing on exam to suggest COPD component to shortness of breath and even finished a recent course of steroids for gout flare.  Supposed to be repeating spirometry next spring 2025  - Continue home formulation of Advair and Spiriva  - Albuterol as needed  - Management of heart failure exacerbation and pulm hypertension as above    Hx of Persistent Atrial Fibrillation   CHA2DS2-VASc score of 5, however after anemia and concern for bleeding patient was not started on Eliquis.  Had a cardioversion back in 01/10/2021.  Seems to be in normal sinus rhythm on exam.  - Can continue amiodarone 200 mg daily    Hx of Iron Deficiency Anemia  Has had iron infusions and been placed on oral supplementation in the past.   - repeat iron panel   - monitor daily CBC    Chronic Problems  Chronic Pain: Followed by the outpatient pain clinic [facility] in [place]. Takes OxyContin 12 mg 2 times a day, can continue current regimen inpatient with bowel regimen ordered.  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  In the setting of acute injury on top of [name], will manage with SSI for now.  Volume limiting p.o. intake for now.  Gout: Recent flare of left elbow and wrist. Treated with prednisone about two weeks ago. Not currently in flare at the moment. Can consider allopurinol initiation (limited by eGFR).     The patient's presentation is complicated by the following clinically significant conditions requiring additional evaluation and treatment: - Chronic kidney disease POA requiring further investigation, treatment, or monitoring   - Age related debility POA requiring additional resources: DME, PT, or OT  - Hyponatremia POA requiring further investigation, treatment, or monitoring     Issues Impacting Complexity of Management:  -High risk of complications from pain and/or analgesia likely to result in delirium  -The patient is at high risk from Hospital immobility in an elderly patient given baseline poor functional status with a high risk of causing delirium and further decline in function  -The patient is at high risk of complications from HF with cardiac amyloidosis, [name] stage IV    Medical Decision Making: Reviewed records from the following unique sources  prior Cardiology, nephrology, and pulmonary notes outpatient. Independently interpreted CXR notable for bilateral interstitial infiltrates concerning for pulmonary edema.      Checklist:  Diet: Regular Diet  DVT PPx: Heparin 5000units q8h  Code Status: DNR and DNI  Dispo: Patient appropriate for Observation based on expectation of ongoing need for hospitalization less than two midnights and/or low intensity of services provided    Team Contact Information:   Primary Team: Geriatrics ([facility])  Primary Resident: Julious Payer, MD  Resident's  Pager: [phone] (Geriatrics Senior Resident)    Chief Concern:   Acute exacerbation of CHF (congestive heart failure) (CMS-HCC)    Subjective:   Barbara Huber is a 82 y.o. female with pertinent PMHx of [name] 2/2 cardiac amyloidosis, pulmonary hypertension, chronic respiratory failure on 3 L, COPD, [name] stage IV, atrial fibrillation, DM II that presented to [facility] with shortness of breath found to be in a heart failure exacerbation.     History obtained by resident from patient and daughter, Mardi Mainland, at bedside.     HPI:  Patient presents to the [facility] emergency department today after feeling some worsening shortness of breath for the past week.  Of note patient recently had a gout flare of left elbow and left hand.  Aspirate of left elbow showing uric acid crystals.  Treated with a 6-day course of steroids.    After that about a week ago from daughter patient had trouble urinating.  Patient discussed with outpatient cardiologist and prescribed metolazone to help patient urinate a little more.  After initiation of metolazone noticed improvement of urine output.    However from family patient may not felt right over the past few days and they were concerned that they may have over diuresed patient with the metolazone.  They held off the evening dose of torsemide (usually takes torsemide 40 mg in the morning and 20 mg in the evening) for the past 2 days.  Patient however continued get more short of breath and decided to come in for further evaluation.    Denies any recent chest pain, nausea, vomiting, diarrhea, constipation, lower extremity swelling.    In the ED noted to be afebrile, HR of 90, BP of 129/64, satting 94% on home 3L. On labs found to have a sodium level of 134, Cr of 3.12, Hgb of 13.2, WBC of 12.8 with neutrophil prednominance, Hs trop of 103 that uptrended to 132, and a BNP of 342.57. Patient given IV lasix 60 mg and admitted for further management.     Pertinent Surgical Hx  Past Surgical History:   Procedure Laterality Date    BACK SURGERY  1995    CARPAL TUNNEL RELEASE Left 2014    HYSTERECTOMY  1971    IR INSERT CHOLECYSTOSMY TUBE PERCUTANEOUS  10/02/2020    IR INSERT CHOLECYSTOSMY TUBE PERCUTANEOUS 10/02/2020 Braulio Conte, MD IMG VIR H&V [facility]    LUMBAR DISC SURGERY      PR REMOVAL GALLBLADDER N/A 10/06/2020    Procedure: CHOLECYSTECTOMY;  Surgeon: Katherina Mires, MD;  Location: MAIN OR [facility];  Service: Trauma    PR RIGHT HEART CATH O2 SATURATION & CARDIAC OUTPUT N/A 09/30/2020    Procedure: Right Heart Catheterization;  Surgeon: Neal Dy, MD;  Location: [facility] CATH;  Service: Cardiology    PR RIGHT HEART CATH O2 SATURATION & CARDIAC OUTPUT N/A 01/09/2021    Procedure: Right Heart Catheterization;  Surgeon: Lesle Reek, MD;  Location: [facility] CATH;  Service: Cardiology        Pertinent Family Hx  Dad: Rectal Cancer at 61 years old  maternal grandmother: HTN   Daughter : Colon cancer 77   Brother : Esophageal Cancer 50's early   No family history of heart or lung conditions      Pertinent Social Hx   Patient lives with at her daughter's home, where her daughter and daughter's husband live.  Patient able to take care of some of her ADLs like dressing eating herself.  However her  IADLs are taking care of by her family.  Able to use the help of a rollator for ambulation.  Does not drink any alcohol as of now.  Used to smoke but has not smoked in years (has been 6 to 10 years).  Used to smoke a pack a day for about 20 years.    Allergies  Nitrofurantoin and Lipitor [atorvastatin]    I reviewed the Medication List. The current list is Accurate  Prior to Admission medications    Medication Dose, Route, Frequency   acetaminophen (TYLENOL) 500 MG tablet 1,000 mg, Oral, 3 times a day (standard)   albuterol HFA 90 mcg/actuation inhaler 2 puffs, Inhalation, Every 8 hours PRN   amiodarone (PACERONE) 200 MG tablet 200 mg, Oral, Daily (standard)   blood sugar diagnostic (ACCU-CHEK GUIDE TEST STRIPS) Strp Use to check blood sugar daily as instructed by clinic. Dx E11.9 For daily testing   blood-glucose meter kit Use as instructed Daily testing   cranberry 500 mg cap 500 mg, Oral, Daily   cyclobenzaprine (FLEXERIL) 5 MG tablet 7.5 mg, Oral, 2 times a day PRN   ergocalciferol-1,250 mcg, 50,000 unit, (DRISDOL) 1,250 mcg (50,000 unit) capsule 1,250 mcg, Oral, Weekly   estradioL (ESTRACE) 0.01 % (0.1 mg/gram) vaginal cream 2 g, Vaginal, 2 times a week   ferrous sulfate 325 (65 FE) MG EC tablet 325 mg, Oral, Every Monday, Wednesday, Friday   fluticasone propion-salmeterol (ADVAIR HFA) 115-21 mcg/actuation inhaler 2 puffs, Inhalation, 2 times a day (standard)   fosfomycin (MONUROL) 3 gram Pack    gabapentin (NEURONTIN) 300 MG capsule 300 mg, Oral, 2 times a day   insulin glargine (BASAGLAR, LANTUS) 100 unit/mL (3 mL) injection pen 4 Units, Subcutaneous, Nightly   lancets Misc 1 each, Miscellaneous, Daily (standard), Accu Check   methylPREDNISolone (MEDROL DOSEPACK) 4 mg tablet follow package directions   metOLazone (ZAROXOLYN) 5 MG tablet 5 mg, Oral, Daily PRN   montelukast (SINGULAIR) 10 mg tablet 10 mg, Oral, Every morning   NARCAN 4 mg/actuation nasal spray 1 spray, Alternating Nares, Once as needed, PRN - Emergency use.   NON FORMULARY APPLY FROM NECK DOWN TWICE A DAY   OXYCONTIN 10 mg TR12 12 hr crush resistant ER/CR tablet 10 mg, Oral, Every 12 hours   OXYGEN-AIR DELIVERY SYSTEMS MISC 5 L, Miscellaneous, Currently using 3  L/min via [place]   pantoprazole (PROTONIX) 20 MG tablet 20 mg, Oral, Daily (standard), TAKE 1 TABLET(20 MG) BY MOUTH DAILY   pen needle, diabetic (BD ULTRA-FINE SHORT PEN NEEDLE) 31 gauge x 5/16 (8 mm) Ndle Give 4 units of insulin each night   rosuvastatin (CRESTOR) 5 MG tablet 5 mg, Oral, Every other day   spironolactone (ALDACTONE) 25 MG tablet 25 mg, Oral, Daily (standard), TAKE 1 TABLET(25 MG) BY MOUTH DAILY   tafamidis (VYNDAMAX) 61 mg cap Take 1 capsule (61 mg) by mouth daily.   tiotropium bromide (SPIRIVA RESPIMAT) 2.5 mcg/actuation inhalation mist 2 puffs, Inhalation, Daily (standard)   torsemide (DEMADEX) 20 MG tablet 60 mg, Oral, Daily (standard)   vitamin A-3,000 mcg RAE, 10,000 UNIT, 3,000 mcg RAE (10,000 UNIT) capsule 3,000 mcg of RAE, Oral, Daily (standard)   vutrisiran (AMVUTTRA) 25 mg/0.5 mL injection 25 mg, Subcutaneous, Once, Every 12 Weeks       [employer] Decision Maker:  Ms. Schiffler currently has decisional capacity for healthcare decision-making and is able to designate a surrogate healthcare decision maker. Ms. Preza designated healthcare decision maker(s) is/are Economist (the [employer]  Power of [employer]) as denoted by health care power of attorney or other advance directive and patient stated preference.    Objective:   Physical Exam:  Temp:  [36.3 ??C (97.3 ??F)-36.4 ??C (97.5 ??F)] 36.4 ??C (97.5 ??F)  Heart Rate:  [90] 90  SpO2 Pulse:  [75-76] 75  Resp:  [18-22] 22  BP: (115-159)/(64-72) 115/72  SpO2:  [94 %-96 %] 96 %    Gen: NAD, converses appropriately, sitting in bed  Eyes: Sclera anicteric, EOMI grossly normal   HENT: Atraumatic, normocephalic  Neck: Trachea midline  Heart: Normal rate, and rhythm, no systolic murmur, elevated JVD to mandible, +hepatojugular reflux  Lungs: CTAB, very minimal crackles, no wheezes, breathing comfortably on 4 L nasal cannula  Abdomen: Soft, mildly distended, nontender, normal active bowel sounds  Extremities: No lower extremity edema, Left wrist in brace with left elbow with mild restriction of movement after recent gout flare  Neuro: Grossly symmetric, non-focal    Skin:  No rashes, lesions on clothed exam  Psych: Alert, oriented x4

## 2023-08-19 NOTE — Unmapped (Addendum)
Pt got admitted for acute exacerbation of CHF. Alert and oriented x 4. On [place] @3  LPM. Skin and fall precautions maintained. Oriented to unit and call bell.  Urine sample sent. Pt complaints of pain in right leg. PRN pain med given. Admission assessment and two person skin check completed. Wrist wrap in place for sprain. Verbalized plan of care. Call bell within reach. Working towards prioritized goals.    24 hour chart check completed.    Problem: Adult Inpatient Plan of Care  Goal: Plan of Care Review  Outcome: Progressing     Problem: Adult Inpatient Plan of Care  Goal: Optimal Comfort and Wellbeing  Outcome: Progressing     Problem: Fall Injury Risk  Goal: Absence of Fall and Fall-Related Injury  Outcome: Progressing     Problem: Heart Failure  Goal: Optimal Coping  Outcome: Progressing     Problem: Heart Failure  Goal: Optimal Cardiac Output  Outcome: Progressing     Problem: Heart Failure  Goal: Stable Heart Rate and Rhythm  Outcome: Progressing   6CIT Score for Cognitive Screening:  Complete for all new admissions, scoring found on the back of the Kardex  If a patient has known dementia, no need to complete the 6CIT  [] Patient has dementia  [x] Normal 6CIT (<8)    [] Significant 6CIT (8 or higher)  [] Needs to be completed    CAM Delirium Screening Tool  Be sure to ask questions to gauge orientation and attention during your assessment  Feature Present? Scoring: CAM Positive if:   Acute onset (different from baseline) with Fluctuating course [] Yes    [x] No   Yes to BOTH of these AND.Marland KitchenMarland Kitchen   Inattention (days of the week or months of the year backward) [] Yes    [x] No    Disorganized thinking [] Yes    [x] No   Yes to at least   ONE of these   Altered level of consciousness  [] Yes    [x] No    CAM []  Positive  [x] Negative    CAM-S: Symptom Severity Score  Complete on all patients ?82 years old, including in patients with dementia  Do not complete if the patient is known to be non-verbal due to their dementia  Feature Severity Score  Question   ACUTE ONSET  & FLUCTUATING  COURSE [x] No (0)    [] Yes (1)    Is there evidence of an acute change in mental status from the patient's baseline? Did the patient's behavior fluctuate at any point during the interview for any of the 10 features?   2. INATTENTION [x] No (0)    [] Yes, mild (1)  [] Yes, marked (2)  Did the patient have difficulty focusing attention, for example being easily distractible, or having difficulty keeping track of what was being said?   3. DISORGANIZED  THINKING [x] No (0)    [] Yes, mild (1)  [] Yes, marked (2)  Was the patient's thinking disorganized or incoherent, such as rambling or irrelevant conversation, unclear or illogical flow or of ideas, unpredictable switching from subject to subject?   4. ALTERED LEVEL  OF  CONSCIOUSNESS [x] Normal (0)    [] Mild: vigilant or lethargic (1)  [] Marked: stupor or coma (2)    Overall, how would you rate the patient's level of consciousness?   -Alert (normal)   -Vigilant  -Lethargic  -Stupor  -Coma  -Uncertain   5. DISORIENTATION [x] No (0)    [] Yes, mild (1)  [] Yes, marked (2)  Was the patient disoriented at any time during the interview,  such as thinking he/she was somewhere other than the hospital, using the wrong bed, or misjudging the time of day?   6. MEMORY  IMPAIRMENT [x] No (0)    [] Yes, mild (1)  [] Yes, marked (2)    Did the patient demonstrate any memory problems during the interview, such as inability to remember events in the hospital or difficulty remembering instructions?   7. PERCEPTUAL  DISTURBANCES [x] No (0)    [] Yes, mild (1)  [] Yes, marked (2)  Did the patient have any evidence of perceptual disturbances, for example, hallucinations, illusions, or misinterpretations (such as thinking something was moving when it was not)?   8. PSYCHOMOTOR  AGITATION [x] No (0)    [] Yes, mild (1)  [] Yes, marked (2)  At any time during the interview, did the patient have an unusually increased level of motor activity, such as restlessness, picking at bedclothes, tapping fingers, or making frequent sudden changes of position?   9. PSYCHOMOTOR  RETARDATION [x] No (0)    [] Yes, mild (1)  [] Yes, marked (2)  At any time during the interview, did the patient have an unusually decreased level of motor activity, such as sluggishness, staring into space, staying in one position for a long time, or moving very slowly?   10. ALTERED  SLEEP-WAKE  CYCLE  [x] No (0)    [] Yes, mild (1)  [] Yes, marked (2)  Did the patient have evidence of disturbance of the sleep-wake cycle, such as excessive daytime sleepiness with insomnia at   night?   Long Form   SEVERITY SCORE:  Severity Score (add rows 1-10)  Total (0-19): 0    Scoring the CAM-S: Rate each symptom of delirium listed in the CAM instrument as absent (0), mild (1), marked (2). Acute onset or fluctuation is rated as absent (0) or present (1). Summarize these scores into a composite.

## 2023-08-20 LAB — CBC
HEMATOCRIT: 37.3 % (ref 34.0–44.0)
HEMOGLOBIN: 12.1 g/dL (ref 11.3–14.9)
MEAN CORPUSCULAR HEMOGLOBIN CONC: 32.4 g/dL (ref 32.0–36.0)
MEAN CORPUSCULAR HEMOGLOBIN: 29.9 pg (ref 25.9–32.4)
MEAN CORPUSCULAR VOLUME: 92.2 fL (ref 77.6–95.7)
MEAN PLATELET VOLUME: 9.2 fL (ref 6.8–10.7)
PLATELET COUNT: 171 10*9/L (ref 150–450)
RED BLOOD CELL COUNT: 4.04 10*12/L (ref 3.95–5.13)
RED CELL DISTRIBUTION WIDTH: 14.8 % (ref 12.2–15.2)
WBC ADJUSTED: 9.7 10*9/L (ref 3.6–11.2)

## 2023-08-20 LAB — BASIC METABOLIC PANEL
ANION GAP: 10 mmol/L (ref 5–14)
BLOOD UREA NITROGEN: 99 mg/dL — ABNORMAL HIGH (ref 9–23)
BUN / CREAT RATIO: 25
CALCIUM: 9.1 mg/dL (ref 8.7–10.4)
CHLORIDE: 97 mmol/L — ABNORMAL LOW (ref 98–107)
CO2: 28 mmol/L (ref 20.0–31.0)
CREATININE: 4 mg/dL — ABNORMAL HIGH
EGFR CKD-EPI (2021) FEMALE: 11 mL/min/{1.73_m2} — ABNORMAL LOW (ref >=60)
GLUCOSE RANDOM: 171 mg/dL (ref 70–179)
POTASSIUM: 4.2 mmol/L (ref 3.4–4.8)
SODIUM: 135 mmol/L (ref 135–145)

## 2023-08-20 LAB — MAGNESIUM: MAGNESIUM: 2.3 mg/dL (ref 1.6–2.6)

## 2023-08-20 LAB — PHOSPHORUS: PHOSPHORUS: 5.1 mg/dL (ref 2.4–5.1)

## 2023-08-20 MED ADMIN — oxyCODONE (OxyCONTIN) 12 hr crush resistant ER/CR tablet 10 mg: 10 mg | ORAL | @ 15:00:00 | Stop: 2023-09-01

## 2023-08-20 MED ADMIN — ergocalciferol-1,250 mcg (50,000 unit) (DRISDOL) capsule 1,250 mcg: 1250 ug | ORAL | @ 14:00:00

## 2023-08-20 MED ADMIN — pantoprazole (Protonix) EC tablet 20 mg: 20 mg | ORAL | @ 15:00:00

## 2023-08-20 MED ADMIN — heparin (porcine) 5,000 unit/mL injection 7,500 Units: 7500 [IU] | SUBCUTANEOUS | @ 14:00:00

## 2023-08-20 MED ADMIN — tafamidis cap 61 mg: 61 mg | ORAL

## 2023-08-20 MED ADMIN — rosuvastatin (CRESTOR) tablet 5 mg: 5 mg | ORAL | @ 15:00:00

## 2023-08-20 MED ADMIN — montelukast (SINGULAIR) tablet 10 mg: 10 mg | ORAL | @ 15:00:00

## 2023-08-20 MED ADMIN — gabapentin (NEURONTIN) capsule 300 mg: 300 mg | ORAL

## 2023-08-20 MED ADMIN — vitamin A-3,000 mcg RAE (10,000 UNIT) capsule 3,000 mcg of RAE: 3000 ug | ORAL | @ 15:00:00

## 2023-08-20 MED ADMIN — tafamidis cap 61 mg: 61 mg | ORAL | @ 15:00:00

## 2023-08-20 MED ADMIN — bumetanide (BUMEX) injection 3 mg: 3 mg | INTRAVENOUS | @ 14:00:00 | Stop: 2023-08-20

## 2023-08-20 MED ADMIN — fluticasone propion-salmeterol (ADVAIR HFA) 115-21 mcg/actuation inhaler 2 puff: 2 | RESPIRATORY_TRACT

## 2023-08-20 MED ADMIN — acetaminophen (TYLENOL) tablet 1,000 mg **Patient Supplied**: 1000 mg | ORAL | @ 15:00:00

## 2023-08-20 MED ADMIN — fluticasone propion-salmeterol (ADVAIR HFA) 115-21 mcg/actuation inhaler 2 puff: 2 | RESPIRATORY_TRACT | @ 14:00:00

## 2023-08-20 MED ADMIN — oxyCODONE (OxyCONTIN) 12 hr crush resistant ER/CR tablet 10 mg: 10 mg | ORAL | Stop: 2023-09-01

## 2023-08-20 MED ADMIN — amiodarone (PACERONE) tablet 200 mg: 200 mg | ORAL | @ 14:00:00

## 2023-08-20 MED ADMIN — tiotropium bromide (SPIRIVA RESPIMAT) 2.5 mcg/actuation inhalation mist 2 puff: 2 | RESPIRATORY_TRACT | @ 15:00:00

## 2023-08-20 MED ADMIN — gabapentin (NEURONTIN) capsule 300 mg: 300 mg | ORAL | @ 14:00:00

## 2023-08-20 NOTE — Unmapped (Signed)
Care Management  Initial Transition Planning Assessment              General  Care Manager assessed the patient by : Telephone conversation with family, Discussion with Clinical Care team, Medical record review (per daughter pt just fell asleep)  Orientation Level: Oriented X4  Functional level prior to admission: Independent  Reason for referral: Discharge Planning    Contact/Decision Maker  Extended Emergency Contact Information  Primary Emergency Contact: [name],[name]  Address: [address] [place] [address]           [place], [place] [address] [place]  Home Phone: [phone]  Mobile Phone: [phone]  Relation: Daughter    Type of Residence: Mailing Address:  [address] [place] [address]  Contacts: Accompanied by: Family member  Patient Phone Number: [phone] (home)           Medical Provider(s): Artelia Laroche, MD  Reason for Admission: Admitting Diagnosis:  AKI (acute kidney injury) (CMS-HCC) [N17.9]  Acute on chronic diastolic congestive heart failure (CMS-HCC) [I50.33]  Past Medical History:   has a past medical history of Acute kidney injury superimposed on chronic kidney disease (CMS-HCC) (10/11/2020), Acute on chronic diastolic (congestive) heart failure (CMS-HCC) (08/23/2020), Acute on chronic diastolic congestive heart failure (CMS-HCC), AKI (acute kidney injury) (CMS-HCC) (04/14/2015), Anemia (08/22/2019), Arthritis, Calculus of kidney, Calculus of ureter, CHF (congestive heart failure) (CMS-HCC), Chronic atrial fibrillation (CMS-HCC) (07/20/2019), COPD (chronic obstructive pulmonary disease) (CMS-HCC), Diabetes (CMS-HCC), Gangrenous cholecystitis (10/11/2020), Generalized edema  (06/17/2021), GERD (gastroesophageal reflux disease), Hydronephrosis, Hypertension, Hyponatremia (10/11/2020), Intermediate coronary syndrome (CMS-HCC) (03/13/2014), Lower extremity edema (09/28/2020), Lumbar stenosis, Microscopic hematuria, Nausea alone, Nephrolithiasis (04/17/2016), Neuropathy, Nocturia, Nocturia (07/01/2017), Other chronic cystitis, 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  Past Surgical History:   has a past surgical history that includes Carpal tunnel release (Left, 2014); Hysterectomy (1971); Back surgery (1995); Lumbar disc surgery; IR Insert Cholecystosmy Tube Percutaneous (10/02/2020); pr removal gallbladder (N/A, 10/06/2020); pr right heart cath o2 saturation & cardiac output (N/A, 09/30/2020); and pr right heart cath o2 saturation & cardiac output (N/A, 01/09/2021).   Previous admit date: 02/16/2023    Primary Insurance- Payor: HUMANA MEDICARE ADV / Plan: HUMANA GOLD PLUS HMO / Product Type: *No Product type* /   Secondary Insurance - None  Prescription Coverage -   Preferred Pharmacy - [facility] [place] - [address]  [id number] - [place], [place] - [address] AT [facility]  [facility] - [place], [place] - [address]    Transportation home: Heritage manager Next of Kin / Guardian / POA / Advance Directives     HCDM (patient stated preference): [name],[name] - Daughter - [phone]    Advance Directive (Medical Treatment)  Does patient have an advance directive covering medical treatment?: Patient has advance directive covering medical treatment, copy in chart.    Health Care Decision Maker [HCDM] (Medical & Mental Health Treatment)  Healthcare Decision Maker: HCDM documented in the HCDM/Contact Info section.  Information offered on HCDM, Medical & Mental Health advance directives:: Patient given information.    Advance Directive (Mental Health Treatment)  Does patient have an advance directive covering mental health treatment?: Patient has advance directive covering mental health treatment, copy in chart.    Readmission Information  Have you been hospitalized in the last 30 days?: No     Did the following happen with your discharge?  Patient Information  Lives with: Children, Family members  Type of Residence: Private residence   Location/Detail: [place] Pt live sin 1 level home has ramp access  Support Systems/Concerns: Children, Family Members  Responsibilities/Dependents at home?: No  Home Care services in place prior to admission?: No   Outpatient/Community Resources in place prior to admission: Clinic  Agency detail (Name/Phone #): Dr Alesia Morin PCP  Equipment Currently Used at Home: oxygen, other (see comments) (rolator [employer])  Current HME Agency (Name/Phone #): O2 provided by [employer] and [employer] by [facility]  Currently receiving outpatient dialysis?: No   Financial Information     Need for financial assistance?: No     Social Determinants of Health  Social Determinants of Health     Financial Resource Strain: Low Risk  (08/20/2023)    Overall Financial Resource Strain (CARDIA)     Difficulty of Paying Living Expenses: Not hard at all   Internet Connectivity: Not on file   Food Insecurity: No Food Insecurity (08/20/2023)    Hunger Vital Sign     Worried About Running Out of Food in the Last Year: Never true     Ran Out of Food in the Last Year: Never true   Tobacco Use: Medium Risk (08/19/2023)    Patient History     Smoking Tobacco Use: Former     Smokeless Tobacco Use: Never     Passive Exposure: Not on file   Housing/Utilities: Low Risk  (08/20/2023)    Housing/Utilities     Within the past 12 months, have you ever stayed: outside, in a car, in a tent, in an overnight shelter, or temporarily in someone else's home (i.e. couch-surfing)?: No     Are you worried about losing your housing?: No     Within the past 12 months, have you been unable to get utilities (heat, electricity) when it was really needed?: No   Alcohol Use: Not At Risk (03/18/2023)    Alcohol Use     How often do you have a drink containing alcohol?: Never     How many drinks containing alcohol do you have on a typical day when you are drinking?: 1 - 2     How often do you have 5 or more drinks on one occasion?: Never   Transportation Needs: No Transportation Needs (08/20/2023)    PRAPARE - Transportation     Lack of Transportation (Medical): No     Lack of Transportation (Non-Medical): No   Substance Use: Low Risk  (06/17/2023)    Substance Use     Taken prescription drugs for non-medical reasons: Never     Taken illegal drugs: Never     Patient indicated they have taken drugs in the past year for non-medical reasons: Yes, [positive answer(s)]: Not on file   Health Literacy: Medium Risk (05/02/2021)    Health Literacy     : Rarely   Physical Activity: Unknown (11/04/2017)    Received from [facility], [facility]    Exercise Vital Sign     Days of Exercise per Week: Patient declined     Minutes of Exercise per Session: Patient declined   Interpersonal Safety: Unknown (08/20/2023)    Interpersonal Safety     Unsafe Where You Currently Live: Not on file     Physically Hurt by Anyone: Not  on file     Abused by Anyone: Not on file   Stress: Unknown (11/04/2017)    Received from [facility], [facility]    [employer] of Occupational Health - Occupational Stress Questionnaire     Feeling of Stress : Patient declined   Intimate Partner Violence: Not At Risk (12/17/2021)    Humiliation, Afraid, Rape, and Kick questionnaire     Fear of Current or Ex-Partner: No     Emotionally Abused: No     Physically Abused: No     Sexually Abused: No   Depression: Not at risk (03/18/2023)    PHQ-2     PHQ-2 Score: 0   Social Connections: Unknown (11/04/2017)    Received from [facility], [facility]    Social Connection and Isolation Panel [NHANES]     Frequency of Communication with Friends and Family: Patient declined     Frequency of Social Gatherings with Friends and Family: Patient declined Attends Religious Services: Patient declined     Database administrator or Organizations: Patient declined     Attends Engineer, structural: Patient declined     Marital Status: Patient declined       Complex Discharge Information    Is patient identified as a difficult/complex discharge?: No  Interventions:     Discharge Needs Assessment  Concerns to be Addressed: discharge planning, adjustment to diagnosis/illness, basic needs, coping/stress  Clinical Risk Factors: > 65, New Diagnosis, Multiple Diagnoses (Chronic)  Barriers to taking medications: No  Prior overnight hospital stay or [name] in last 90 days: No  Anticipated Changes Related to Illness: none  Equipment Needed After Discharge: none    Discharge Facility/Level of Care Needs: other (see comments) (Home)    Readmission  Risk of Unplanned Readmission Score: UNPLANNED READMISSION SCORE: 19.06%  Predictive Model Details          19% (Medium)  Factor Value    Calculated 08/20/2023 12:03 23% Number of active inpatient medication orders 34    [facility] Risk of Unplanned Readmission Model 11% Number of ED visits in last six months 2     9% ECG/EKG order present in last 6 months     8% Latest BUN high (89 mg/dL)     7% Imaging order present in last 6 months     6% Age 1     6% Phosphorous result present     5% Number of hospitalizations in last year 1     5% Charlson Comorbidity Index 4     5% Diagnosis of deficiency anemia present     4% Active anticoagulant inpatient medication order present     4% Latest creatinine high (3.35 mg/dL)     4% Diagnosis of renal failure present     2% Future appointment scheduled     1% Current length of stay 0.961 days     1% Active ulcer inpatient medication order present      Readmitted Within the Last 30 Days? (No if blank)   Patient at risk for readmission?: Yes    Discharge Plan  Screen findings are: Discharge planning needs identified or anticipated (Comment). (Possibly will need HH)    Expected Discharge Date: TBD    Expected Transfer from Critical Care:  NA    Quality data for continuing care services shared with patient and/or representative?: Yes  Patient and/or family were provided with choice of facilities / services that  are available and appropriate to meet post hospital care needs?: Yes   List choices in order highest to lowest preferred, if applicable. : No preference per daughter    Initial Assessment complete?: Yes

## 2023-08-20 NOTE — Unmapped (Signed)
[facility] Refill Coordination Note    Specialty Medication(s) to be Shipped:   General Specialty: Vyndamax 61 mg    Other medication(s) to be shipped: No additional medications requested for fill at this time     Barbara Huber, DOB: 20-Jul-1941  Phone: [phone] (home)       All above HIPAA information was verified with patient's family member, Barbara Huber.     Was a Nurse, learning disability used for this call? No    Completed refill call assessment today to schedule patient's medication shipment from the [facility] [phone]).  All relevant notes have been reviewed.     Specialty medication(s) and dose(s) confirmed: Regimen is correct and unchanged.   Changes to medications: Barbara Huber reports no changes at this time.  Changes to insurance: No  New side effects reported not previously addressed with a pharmacist or physician: None reported  Questions for the pharmacist: No    Confirmed patient received a Conservation officer, historic buildings and a Surveyor, mining with first shipment. The patient will receive a drug information handout for each medication shipped and additional FDA Medication Guides as required.       DISEASE/MEDICATION-SPECIFIC INFORMATION        N/A    SPECIALTY MEDICATION ADHERENCE     Medication Adherence    Patient reported X missed doses in the last month: 0  Specialty Medication: Vyndamax 61mg   Patient is on additional specialty medications: No  Patient is on more than two specialty medications: No  Any gaps in refill history greater than 2 weeks in the last 3 months: no  Demonstrates understanding of importance of adherence: yes  Informant: patient              Were doses missed due to medication being on hold? No     VYNDAMAX 61  mg: 10 days of medicine on hand       REFERRAL TO PHARMACIST     Referral to the pharmacist: Not needed      [facility]     Shipping address confirmed in Epic.       Delivery Scheduled: Yes, Expected medication delivery date: 08/26/23.     Medication will be delivered via Same Day Courier to the prescription address in Epic WAM.    Barbara Huber   [facility] Specialty Technician

## 2023-08-20 NOTE — Unmapped (Signed)
Patient is alert, oriented, and able to ambulate with walker. Patient is unsteady without slight assist or device. Patient reviewed plan of care with provider, Dr. Laurance Flatten via video. Patient stated she feels better at this time. Plan is for patient to have IV torsemide to remove excess fluid, measure I/O using hat for urine measurement. [facility].    Problem: Adult Inpatient Plan of Care  Goal: Plan of Care Review  Outcome: Ongoing - Unchanged  Flowsheets (Taken 08/19/2023 1805)  Progress: no change  Outcome Evaluation: Patient is home and on 3L , which is baseline for patient.  Plan of Care Reviewed With:   patient   child  Goal: Patient-Specific Goal (Individualized)  Outcome: Ongoing - Unchanged  Goal: Absence of Hospital-Acquired Illness or Injury  Outcome: Ongoing - Unchanged  Intervention: Identify and Manage Fall Risk  Recent Flowsheet Documentation  Taken 08/19/2023 1700 by Alfonso Patten, RN  Safety Interventions:   fall reduction program maintained   family at bedside   lighting adjusted for tasks/safety  Intervention: Prevent Skin Injury  Recent Flowsheet Documentation  Taken 08/19/2023 1700 by Alfonso Patten, RN  Positioning for Skin: Sitting in Chair  Device Skin Pressure Protection: adhesive use limited  Skin Protection: adhesive use limited  Goal: Optimal Comfort and Wellbeing  Outcome: Ongoing - Unchanged  Goal: Readiness for Transition of Care  Outcome: Ongoing - Unchanged  Goal: Rounds/Family Conference  Outcome: Ongoing - Unchanged     Problem: Fall Injury Risk  Goal: Absence of Fall and Fall-Related Injury  Outcome: Ongoing - Unchanged  Intervention: Promote Injury-Free Environment  Recent Flowsheet Documentation  Taken 08/19/2023 1700 by Alfonso Patten, RN  Safety Interventions:   fall reduction program maintained   family at bedside   lighting adjusted for tasks/safety     Problem: Self-Care Deficit  Goal: Improved Ability to Complete Activities of Daily Living  Outcome: Ongoing - Unchanged     Problem: Heart Failure  Goal: Optimal Coping  Outcome: Ongoing - Unchanged  Goal: Optimal Cardiac Output  Outcome: Ongoing - Unchanged  Goal: Stable Heart Rate and Rhythm  Outcome: Ongoing - Unchanged  Goal: Optimal Functional Ability  Outcome: Ongoing - Unchanged  Intervention: Optimize Functional Ability  Recent Flowsheet Documentation  Taken 08/19/2023 1700 by Alfonso Patten, RN  Activity Management: up in chair  Goal: Fluid and Electrolyte Balance  Outcome: Ongoing - Unchanged  Goal: Improved Oral Intake  Outcome: Ongoing - Unchanged  Goal: Effective Oxygenation and Ventilation  Outcome: Ongoing - Unchanged  Intervention: Promote Airway Secretion Clearance  Recent Flowsheet Documentation  Taken 08/19/2023 1700 by Alfonso Patten, RN  Activity Management: up in chair  Goal: Effective Breathing Pattern During Sleep  Outcome: Ongoing - Unchanged     Problem: Infection  Goal: Absence of Infection Signs and Symptoms  Outcome: Ongoing - Unchanged

## 2023-08-20 NOTE — Unmapped (Signed)
Problem: Adult Inpatient Plan of Care  Goal: Plan of Care Review  Outcome: Progressing  Flowsheets (Taken 08/20/2023 0117)  Progress: improving  Plan of Care Reviewed With: patient  Goal: Patient-Specific Goal (Individualized)  Outcome: Progressing  Goal: Absence of Hospital-Acquired Illness or Injury  Outcome: Progressing  Goal: Optimal Comfort and Wellbeing  Outcome: Progressing  Goal: Readiness for Transition of Care  Outcome: Progressing  Goal: Rounds/Family Conference  Outcome: Progressing     Problem: Fall Injury Risk  Goal: Absence of Fall and Fall-Related Injury  Outcome: Progressing     Problem: Self-Care Deficit  Goal: Improved Ability to Complete Activities of Daily Living  Outcome: Progressing     Problem: Heart Failure  Goal: Optimal Coping  Outcome: Progressing  Goal: Optimal Cardiac Output  Outcome: Progressing  Goal: Stable Heart Rate and Rhythm  Outcome: Progressing  Goal: Optimal Functional Ability  Outcome: Progressing  Goal: Fluid and Electrolyte Balance  Outcome: Progressing  Goal: Improved Oral Intake  Outcome: Progressing  Goal: Effective Oxygenation and Ventilation  Outcome: Progressing  Goal: Effective Breathing Pattern During Sleep  Outcome: Progressing     Problem: Infection  Goal: Absence of Infection Signs and Symptoms  Outcome: Progressing

## 2023-08-20 NOTE — Unmapped (Signed)
[facility] Daily Video Progress Note    Assessment/Plan:    Principal Problem:    Acute exacerbation of CHF (congestive heart failure) (CMS-HCC)  Active Problems:    Spinal stenosis of lumbar region    COPD (chronic obstructive pulmonary disease) (CMS-HCC)    Sleep apnea in adult    Essential hypertension    Type 2 diabetes mellitus with stage 4 chronic kidney disease, with long-term current use of insulin (CMS-HCC)    Peripheral neuropathy    Chronic respiratory failure with hypoxia (CMS-HCC)    Persistent atrial fibrillation (CMS-HCC)    Chronic kidney disease ([name]), stage IV (severe) (CMS-HCC)    Pulmonary hypertension (CMS-HCC)    Dyspnea on exertion    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)    Familial amyloid heart disease (CMS-HCC)                 Barbara Huber is a 82 y.o. female with a PMHx of [name], cardiac amyloidosis, pulmonary hypertension, chronic respiratory failure on 3 L, COPD, [name] stage IV, atrial fibrillation, DM II that presented to [facility] with shortness of breath and found to have Acute exacerbation of CHF (congestive heart failure) (CMS-HCC).    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 Per dtr, seems exacerbated by recent prednisone burst prescribed for gout flare.  -On ED eval, BNP of 342.57 (up from 118.62 from 07/14/23), mild abdominal distension, elevated JVD and hepatojugular reflux with chest x-ray showing some pulmonary edema with possible small bilateral effusions. Echocardiogram with grade 2 diastolic dysfxn, EF >70%  -Pt did not respond adequately to IV Lasix 80 mg (200 ml output) nor 120 mg IV Lasix (300 ml output).   -Transitioned to bumex 3mg  IV BID (changed to daily prior to getting second dose 8/23) and metolazone. Spironolactone held for now due to borderline low BP.  -Continue home tafamidis 61 mg daily (patient's family will bring tomorrow)  -Gets injections of amvuttra 25 mg every 3 months, last infused last month (07/14/2023)  -Jardiance Dc'ed due to recurrent UTIs  -Strict in and outs, daily weights, 2gm Na restriction  -Given elevation in creatinine, will hold further doses of bumex until tomorrow's labs **It should be noted this creatinine bump occurred prior to patient receiving any bumex, and could certainly represent cardiorenal syndrome as diuresis has been insufficient thus far during this admission.     Troponin Elevation - Likely Type II MI   -Slight trend upward, suspect demand but has peaked     Acute Kidney Injury on [name] Stage IV   -Baseline seems to be around 2.5, presented with Cr of 3.12, now increased to 4.0.   -Still likely some component due to cardiorenal physiology, but fairly euvolemic on exam so will monitor very closely and recheck prior to further diuresis.   -CTM closely, daily BMP   -Avoid nephrotoxic agents, renally dose meds     Chronic Respiratory Failure - COPD - Pulmonary HTN   -H/o chronic respiratory failure on home 3 to 4 L daily.   -Denies any cough, and no wheezing on exam to suggest COPD component to shortness of breath and even finished a recent course of steroids for gout flare.   -Continue home Advair and Spiriva, Albuterol as needed  -  Management of heart failure exacerbation and pulm hypertension as above     Hx of Persistent Atrial Fibrillation   -CHA2DS2-VASc score of 5, however after anemia in the past and concern for bleeding patient was not started on Eliquis.   -S/p cardioversion 01/10/2021. Seems to be in normal sinus rhythm on exam.  -Can continue amiodarone 200 mg daily     Hx of Iron Deficiency Anemia   -Has had iron infusions and been placed on oral supplementation in the past.   -CTM daily CBC    Chronic Pain:   -No acute issues.  -Continue home regimen along with bowel regimen.     Peripheral Neuropathy: -Continue gabapentin 300 mg BID    HLD:   -Continue home Crestor     GERD:   -Continue home protonix 20 mg daily    DM II:   -Takes glargine 4 units nightly. Held on admission in favor of SSI, but will resume with hold parameters.     Gout:   -Recent flare of left elbow and wrist. Treated with prednisone about two weeks ago.   -Not currently in flare at the moment.   -Can consider allopurinol initiation (limited by eGFR).     VTE prophylaxis : [place] prediction score = 6 (bedrest for at least 3 days (3), age of at least 101 (1), heart or respiratory failure (1), and BMI of at least 30 (1)) >= 4 - will order chemoprophylaxis with heparin Barbara Huber.      Estimated Date of Discharge: TBD    In-home provider that assisted with visit was [name], RN    I was outside the hospital [facility] Control).  I personally spent 35 minutes face-to-face and non-face-to-face in the care of this patient, which includes all pre, intra, and post visit time on the date of service.  All documented time was specific to the E/M visit and does not include any procedures that may have been performed.     The patient and/or parent/guardian has been advised of the potential risks and limitations of this mode of treatment (including, but not limited to, the absence of in-person examination) and has agreed to be treated using telemedicine. The  Patient's, patient's guardian's and/or patient's family's questions regarding telemedicine have been answered.     ___________________________________________________________________    Subjective:  No acute events overnight, patient denies specific complaints, reports breathing is improved, good PO intake. Minimal UOP. Received first dose of IV bumex and today's morning visit around 11:00.    Labs/Studies:  Labs and Studies from the last 24hrs per EMR and Reviewed, , and All lab results last 24 hours:    Recent Results (from the past 24 hour(s))   hsTroponin I (single, no delta)    Collection Time: 08/19/23  5:00 PM   Result Value Ref Range    hsTroponin I 152 (HH) <=34 ng/L   Magnesium Level    Collection Time: 08/19/23  5:46 PM   Result Value Ref Range    Magnesium 2.3 1.6 - 2.6 mg/dL   Basic Metabolic Panel    Collection Time: 08/19/23  5:46 PM   Result Value Ref Range    Sodium 137 135 - 145 mmol/L    Potassium 4.0 3.4 - 4.8 mmol/L    Chloride 97 (L) 98 - 107 mmol/L    CO2 28.0 20.0 - 31.0 mmol/L    Anion Gap 12 5 - 14 mmol/L    BUN 89 (H) 9 - 23 mg/dL  Creatinine 3.35 (H) 0.55 - 1.02 mg/dL    BUN/Creatinine Ratio 27     eGFR [name]-EPI (2021) Female 13 (L) >=60 mL/min/1.55m2    Glucose 101 70 - 179 mg/dL    Calcium 9.4 8.7 - 16.1 mg/dL   POCT Glucose    Collection Time: 08/20/23 10:43 AM   Result Value Ref Range    Glucose, POC 119 65 - 179 mg/dL    Glucose Strip Lot Num home     Glucose Strip Exp home     Serial Number     CBC    Collection Time: 08/20/23 11:10 AM   Result Value Ref Range    WBC 9.7 3.6 - 11.2 10*9/L    RBC 4.04 3.95 - 5.13 10*12/L    HGB 12.1 11.3 - 14.9 g/dL    HCT 09.6 04.5 - 40.9 %    MCV 92.2 77.6 - 95.7 fL    MCH 29.9 25.9 - 32.4 pg    MCHC 32.4 32.0 - 36.0 g/dL    RDW 81.1 91.4 - 78.2 %    MPV 9.2 6.8 - 10.7 fL    Platelet 171 150 - 450 10*9/L   Magnesium Level    Collection Time: 08/20/23 11:10 AM   Result Value Ref Range    Magnesium 2.3 1.6 - 2.6 mg/dL   Phosphorus Level    Collection Time: 08/20/23 11:10 AM   Result Value Ref Range    Phosphorus 5.1 2.4 - 5.1 mg/dL   Basic metabolic panel    Collection Time: 08/20/23 11:10 AM   Result Value Ref Range    Sodium 135 135 - 145 mmol/L    Potassium 4.2 3.4 - 4.8 mmol/L    Chloride 97 (L) 98 - 107 mmol/L    CO2 28.0 20.0 - 31.0 mmol/L    Anion Gap 10 5 - 14 mmol/L    BUN 99 (H) 9 - 23 mg/dL    Creatinine 9.56 (H) 0.55 - 1.02 mg/dL    BUN/Creatinine Ratio 25     eGFR [name]-EPI (2021) Female 11 (L) >=60 mL/min/1.71m2    Glucose 171 70 - 179 mg/dL    Calcium 9.1 8.7 - 21.3 mg/dL       Objective:  Video quality: Good  Audio quality: Good    Temp:  [36.5 ??C (97.7 ??F)-36.6 ??C (97.9 ??F)] 36.5 ??C (97.7 ??F)  Heart Rate:  [67-70] 67  Resp:  [20] 20  BP: (104-141)/(56-79) 104/56  SpO2:  [95 %-96 %] 95 %  Body mass index is 33.48 kg/m??.    Gen: No acute distress, DOE  HEENT: [place]/AT, EOMI, no scleral icterus, mucous membranes moist  Lungs: Respirations unlabored, no retractions or use of accessory muscles.  No audible cough or wheezing.  Per in-home provider, crackles in LUL, diminished in posterior lung fields.  Heart: Per in-home provider, regular rate and rhythm  [place]: Per in-home provider, no tenderness noted, soft, non-distended, normal bowel sounds.  Extremities: No edema, no discoloration  Skin: No jaundice or petechiae.    Neurologic: Alert and oriented x3, speech clear, no facial droop, no tremor, moves all extremities

## 2023-08-20 NOTE — Unmapped (Signed)
Patient is alert, oriented, and able to ambulate with minor assistance and with walker. Patient's daughter is with her. Patient is on 4L [place] and patient was encouraged to use IS 5-10x/hour while awake and measure urine output during the day. Patient stated understanding. Patient will continue IV bumex to see if medication helps diurese patient.    Problem: Adult Inpatient Plan of Care  Goal: Plan of Care Review  08/20/2023 1333 by Alfonso Patten, RN  Outcome: Ongoing - Unchanged  Flowsheets (Taken 08/20/2023 1333)  Progress: no change  Outcome Evaluation: Patient is on 4L [place] instead of baseline of 3L today. Patient did have weight loss but did not have much urine output.  Plan of Care Reviewed With:   patient   child  08/20/2023 1332 by Alfonso Patten, RN  Outcome: Progressing  Flowsheets (Taken 08/20/2023 1303)  Outcome Evaluation: Patient is on 4L [place] today  Plan of Care Reviewed With:   patient   child  Goal: Patient-Specific Goal (Individualized)  08/20/2023 1333 by Alfonso Patten, RN  Outcome: Ongoing - Unchanged  08/20/2023 1332 by Alfonso Patten, RN  Outcome: Progressing  Goal: Absence of Hospital-Acquired Illness or Injury  08/20/2023 1333 by Alfonso Patten, RN  Outcome: Ongoing - Unchanged  08/20/2023 1332 by Alfonso Patten, RN  Outcome: Progressing  Intervention: Identify and Manage Fall Risk  Recent Flowsheet Documentation  Taken 08/20/2023 1115 by Alfonso Patten, RN  Safety Interventions: fall reduction program maintained  Intervention: Prevent Skin Injury  Recent Flowsheet Documentation  Taken 08/20/2023 1115 by Alfonso Patten, RN  Positioning for Skin: Sitting in Chair  Device Skin Pressure Protection: adhesive use limited  Skin Protection: adhesive use limited  Goal: Optimal Comfort and Wellbeing  08/20/2023 1333 by Alfonso Patten, RN  Outcome: Ongoing - Unchanged  08/20/2023 1332 by Alfonso Patten, RN  Outcome: Progressing  Goal: Readiness for Transition of Care  08/20/2023 1333 by Alfonso Patten, RN  Outcome: Ongoing - Unchanged  08/20/2023 1332 by Alfonso Patten, RN  Outcome: Progressing  Goal: Rounds/Family Conference  08/20/2023 1333 by Alfonso Patten, RN  Outcome: Ongoing - Unchanged  08/20/2023 1332 by Alfonso Patten, RN  Outcome: Progressing     Problem: Fall Injury Risk  Goal: Absence of Fall and Fall-Related Injury  08/20/2023 1333 by Alfonso Patten, RN  Outcome: Ongoing - Unchanged  08/20/2023 1332 by Alfonso Patten, RN  Outcome: Progressing  Intervention: Promote Injury-Free Environment  Recent Flowsheet Documentation  Taken 08/20/2023 1115 by Alfonso Patten, RN  Safety Interventions: fall reduction program maintained     Problem: Self-Care Deficit  Goal: Improved Ability to Complete Activities of Daily Living  08/20/2023 1333 by Alfonso Patten, RN  Outcome: Ongoing - Unchanged  08/20/2023 1332 by Alfonso Patten, RN  Outcome: Progressing     Problem: Heart Failure  Goal: Optimal Coping  08/20/2023 1333 by Alfonso Patten, RN  Outcome: Ongoing - Unchanged  08/20/2023 1332 by Alfonso Patten, RN  Outcome: Progressing  Goal: Optimal Cardiac Output  08/20/2023 1333 by Alfonso Patten, RN  Outcome: Ongoing - Unchanged  08/20/2023 1332 by Alfonso Patten, RN  Outcome: Progressing  Goal: Stable Heart Rate and Rhythm  08/20/2023 1333 by Alfonso Patten, RN  Outcome: Ongoing - Unchanged  08/20/2023 1332 by Alfonso Patten, RN  Outcome: Progressing  Goal: Optimal Functional Ability  08/20/2023 1333 by Alfonso Patten, RN  Outcome:  Ongoing - Unchanged  08/20/2023 1332 by Alfonso Patten, RN  Outcome: Progressing  Intervention: Optimize Functional Ability  Recent Flowsheet Documentation  Taken 08/20/2023 1115 by Alfonso Patten, RN  Activity Management: up in chair  Goal: Fluid and Electrolyte Balance  08/20/2023 1333 by Alfonso Patten, RN  Outcome: Ongoing - Unchanged  08/20/2023 1332 by Alfonso Patten, RN  Outcome: Progressing  Goal: Improved Oral Intake  08/20/2023 1333 by Alfonso Patten, RN  Outcome: Ongoing - Unchanged  08/20/2023 1332 by Alfonso Patten, RN  Outcome: Progressing  Goal: Effective Oxygenation and Ventilation  08/20/2023 1333 by Alfonso Patten, RN  Outcome: Ongoing - Unchanged  08/20/2023 1332 by Alfonso Patten, RN  Outcome: Progressing  Intervention: Promote Airway Secretion Clearance  Recent Flowsheet Documentation  Taken 08/20/2023 1115 by Alfonso Patten, RN  Activity Management: up in chair  Goal: Effective Breathing Pattern During Sleep  08/20/2023 1333 by Alfonso Patten, RN  Outcome: Ongoing - Unchanged  08/20/2023 1332 by Alfonso Patten, RN  Outcome: Progressing     Problem: Infection  Goal: Absence of Infection Signs and Symptoms  08/20/2023 1333 by Alfonso Patten, RN  Outcome: Ongoing - Unchanged  08/20/2023 1332 by Alfonso Patten, RN  Outcome: Progressing

## 2023-08-21 ENCOUNTER — Ambulatory Visit
Admit: 2023-08-21 | Discharge: 2023-08-21 | Disposition: A | Discharge status: Other Acute Inpatient Hospital | Payer: MEDICARE

## 2023-08-21 LAB — HEPATIC FUNCTION PANEL
ALBUMIN: 3.2 g/dL — ABNORMAL LOW (ref 3.4–5.0)
ALKALINE PHOSPHATASE: 154 U/L — ABNORMAL HIGH (ref 46–116)
ALT (SGPT): 10 U/L (ref 10–49)
AST (SGOT): 23 U/L (ref <=34)
BILIRUBIN DIRECT: 0.3 mg/dL (ref 0.00–0.30)
BILIRUBIN TOTAL: 0.6 mg/dL (ref 0.3–1.2)
PROTEIN TOTAL: 7.2 g/dL (ref 5.7–8.2)

## 2023-08-21 LAB — CBC
HEMATOCRIT: 36 % (ref 34.0–44.0)
HEMOGLOBIN: 11.9 g/dL (ref 11.3–14.9)
MEAN CORPUSCULAR HEMOGLOBIN CONC: 33 g/dL (ref 32.0–36.0)
MEAN CORPUSCULAR HEMOGLOBIN: 30.4 pg (ref 25.9–32.4)
MEAN CORPUSCULAR VOLUME: 92 fL (ref 77.6–95.7)
MEAN PLATELET VOLUME: 8.7 fL (ref 6.8–10.7)
PLATELET COUNT: 148 10*9/L — ABNORMAL LOW (ref 150–450)
RED BLOOD CELL COUNT: 3.91 10*12/L — ABNORMAL LOW (ref 3.95–5.13)
RED CELL DISTRIBUTION WIDTH: 14.1 % (ref 12.2–15.2)
WBC ADJUSTED: 10.6 10*9/L (ref 3.6–11.2)

## 2023-08-21 LAB — BASIC METABOLIC PANEL
ANION GAP: 7 mmol/L (ref 5–14)
ANION GAP: 10 mmol/L (ref 5–14)
BLOOD UREA NITROGEN: 84 mg/dL — ABNORMAL HIGH (ref 9–23)
BLOOD UREA NITROGEN: 93 mg/dL — ABNORMAL HIGH (ref 9–23)
BUN / CREAT RATIO: 22
BUN / CREAT RATIO: 26
CALCIUM: 9.3 mg/dL (ref 8.7–10.4)
CALCIUM: 9.7 mg/dL (ref 8.7–10.4)
CHLORIDE: 100 mmol/L (ref 98–107)
CHLORIDE: 96 mmol/L — ABNORMAL LOW (ref 98–107)
CO2: 26.8 mmol/L (ref 20.0–31.0)
CO2: 27.1 mmol/L (ref 20.0–31.0)
CREATININE: 3.82 mg/dL — ABNORMAL HIGH
CREATININE: 3.61 mg/dL — ABNORMAL HIGH
EGFR CKD-EPI (2021) FEMALE: 11 mL/min/{1.73_m2} — ABNORMAL LOW (ref >=60)
EGFR CKD-EPI (2021) FEMALE: 12 mL/min/{1.73_m2} — ABNORMAL LOW (ref >=60)
GLUCOSE RANDOM: 120 mg/dL (ref 70–179)
GLUCOSE RANDOM: 142 mg/dL (ref 70–179)
POTASSIUM: 4.3 mmol/L (ref 3.4–4.8)
POTASSIUM: 4.6 mmol/L (ref 3.4–4.8)
SODIUM: 134 mmol/L — ABNORMAL LOW (ref 135–145)
SODIUM: 133 mmol/L — ABNORMAL LOW (ref 135–145)

## 2023-08-21 LAB — B-TYPE NATRIURETIC PEPTIDE: B-TYPE NATRIURETIC PEPTIDE: 516.4 pg/mL — ABNORMAL HIGH (ref <=100)

## 2023-08-21 LAB — MAGNESIUM: MAGNESIUM: 2.4 mg/dL (ref 1.6–2.6)

## 2023-08-21 LAB — UREA NITROGEN, URINE: UREA NITROGEN URINE: 389 mg/dL

## 2023-08-21 LAB — O2 SATURATION VENOUS: O2 SATURATION VENOUS: 64.5 % (ref 40.0–85.0)

## 2023-08-21 LAB — CREATININE, URINE: CREATININE, URINE: 87.5 mg/dL

## 2023-08-21 MED ADMIN — fluticasone propion-salmeterol (ADVAIR HFA) 115-21 mcg/actuation inhaler 2 puff: 2 | RESPIRATORY_TRACT | @ 01:00:00

## 2023-08-21 MED ADMIN — tafamidis cap 61 mg **Patient Supplied Med**: 61 mg | ORAL | @ 15:00:00

## 2023-08-21 MED ADMIN — vitamin A-3,000 mcg RAE (10,000 UNIT) capsule 3,000 mcg of RAE: 3000 ug | ORAL | @ 15:00:00

## 2023-08-21 MED ADMIN — gabapentin (NEURONTIN) capsule 300 mg: 300 mg | ORAL | @ 01:00:00

## 2023-08-21 MED ADMIN — metOLazone (ZAROXOLYN) tablet 5 mg: 5 mg | ORAL | @ 13:00:00 | Stop: 2023-08-21

## 2023-08-21 MED ADMIN — oxyCODONE (OxyCONTIN) 12 hr crush resistant ER/CR tablet 10 mg: 10 mg | ORAL | @ 01:00:00 | Stop: 2023-09-01

## 2023-08-21 MED ADMIN — furosemide (LASIX) 120 mg in sodium chloride (NS) 0.9 % 50 mL IVPB: 120 mg | INTRAVENOUS | @ 14:00:00 | Stop: 2023-08-21

## 2023-08-21 MED ADMIN — oxyCODONE (OxyCONTIN) 12 hr crush resistant ER/CR tablet 10 mg: 10 mg | ORAL | @ 15:00:00 | Stop: 2023-09-01

## 2023-08-21 MED ADMIN — gabapentin (NEURONTIN) capsule 300 mg: 300 mg | ORAL | @ 21:00:00

## 2023-08-21 MED ADMIN — montelukast (SINGULAIR) tablet 10 mg: 10 mg | ORAL | @ 15:00:00

## 2023-08-21 MED ADMIN — pantoprazole (Protonix) EC tablet 20 mg: 20 mg | ORAL | @ 15:00:00

## 2023-08-21 MED ADMIN — heparin (porcine) 5,000 unit/mL injection 7,500 Units: 7500 [IU] | SUBCUTANEOUS | @ 13:00:00

## 2023-08-21 MED ADMIN — amiodarone (PACERONE) tablet 200 mg: 200 mg | ORAL | @ 15:00:00

## 2023-08-21 MED ADMIN — fluticasone furoate-vilanterol (BREO ELLIPTA) 100-25 mcg/dose inhaler 1 puff: 1 | RESPIRATORY_TRACT | @ 15:00:00

## 2023-08-21 MED ADMIN — umeclidinium (INCRUSE ELLIPTA) 62.5 mcg/actuation inhaler 1 puff: 1 | RESPIRATORY_TRACT | @ 15:00:00

## 2023-08-21 NOTE — Unmapped (Signed)
[name]    Assessment & Plan:   Barbara Huber is a 82 y.o. female whose presentation is complicated by [name], cardiac amyloidosis, pulmonary hypertension, chronic respiratory failure on 3 L, COPD, [name] stage IV, atrial fibrillation, DM II that presented to [facility] with shortness of breath found to be in an acute heart failure exacerbation. Transferred from [facility] back to [facility] with inadequate diuresis and continued shortness of breath.    Principal Problem:    Acute exacerbation of CHF (congestive heart failure) (CMS-HCC)  Active Problems:    Spinal stenosis of lumbar region    COPD (chronic obstructive pulmonary disease) (CMS-HCC)    Sleep apnea in adult    Essential hypertension    Type 2 diabetes mellitus with stage 4 chronic kidney disease, with long-term current use of insulin (CMS-HCC)    Peripheral neuropathy    Chronic respiratory failure with hypoxia (CMS-HCC)    Persistent atrial fibrillation (CMS-HCC)    Chronic kidney disease ([name]), stage IV (severe) (CMS-HCC)    Pulmonary hypertension (CMS-HCC)    Dyspnea on exertion    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)    Familial amyloid heart disease (CMS-HCC)    Transfer Summary: Patient admitted for likely on acute [name] exacerbation with worsening SOB and bodily edema the PM of 8/21. Went to [facility] program on morning of 8/22. Notably patient has had very little output with escalation of lasix doses (IV lasix 80, 120, and then IV 3 mg Bumex). Patient endorsing feeling more SOB and fatigued. With desire from patient and family to be back in the hospital, patient transferred back to [name] for further management.     Active Problems    Acute on Chronic [name] Exacerbation - Grade II DD - Cardiac Amyloidosis - Type II Pulm HTN  Patient follows with cardiology outpatient in the cardiac amyloid clinic and has been seen in pulm HTN clinic as well. Previous diuretic regimen of 40 mg every morning of torsemide, but 20 mg torsemide in PM.  Last echocardiogram from 02/17/2023 with LVEF of greater than 70%, mildly increased wall of LV, dilated LA, mild pulmonary hypertension. On presentation patient more SOB, and found to have BNP of 342.57 (up from 118.62 from 07/14/23), some abdominal distension, elevated JVD and hepatojugular reflux with chest x-ray showing pulmonary edema with possible small bilateral effusions, consistent with heart failure exacerbation. Echocardiogram 08/19/23 with LVEF of 70%, moderate mitral annular calcification, moderately dilated LA, normal RV function. HF exacerbation thought to be in setting of decreased diuretic dosing for the past few days as well as some water retention with prednisone usage. With goal for improvement of volume status with increased [name] dosing (IV lasix 80, 120, IV bumex 3 mg) patient has had limited urine output. Patient's weight seemed to have downtrended (8/22 210 lbs --> 207 lbs), and patient's lung sound improved from prior. Rising renal function could be still cardiorenal vs intravascular depletion. Will reevaluate patient's volume and labs prior to continued diuresis. Will likely need higher dose of lasix and possibly sequential nephron blockade.   - continue home tafamidis 61 mg daily   - Undergoes injections of amvuttra 25 mg every 3 months, last infused last month (07/14/2023)  - CXR and KUB (to assess for constipation for abdominal distention)  - repeat BNP to see if improvement is present   - hepatic Fxn panel to assess for congestive hepatopathy  - Jardiance held with frequent UTI's  - spironolactone  25 mg daily was held due to possible hypotension, add back on as able   - strict in and outs  - daily weights  - K >4, Mg >2   - likely redose with [name] pending above workup    - Trial of BIPAP tonight     Chronic Respiratory Failure - COPD - Pulmonary HTN   Patient with history of chronic respiratory failure on home 3 to 4 L daily.  Denies any cough, and no wheezing on exam to suggest COPD as etiology to shortness of breath, and had finished course of steroids for gout close to admission which should help if due to COPD. Supposed to be repeating spirometry next spring 2025. Will diurese as above, and trial BIPAP while admitted as doesn't have home machine.   - Continue home formulation of Advair and Spiriva  - Albuterol as needed  - Management of heart failure exacerbation and pulm hypertension as above  - trial of BIPAP tonight  - continued use of incentive spirometry  - can consider other etiologies of shortness of breath if not improving with diuresis     Acute Kidney Injury on [name] Stage IV  Follows with nephrology outpatient. [name] thought to be due to diabetes and hypertension. Thought not to have amyloid infiltration of kidneys given the type of amyloid.  Baseline seems to be around 2.5-3, however had been closer to 2.5 previously. Presented with Cr of 3.12. Seems to be worsening even though needed higher doses of lasix up to a Cr of 4 on PM of 8/23.   - daily BMP   - diuresis as above  - Bladder scan to ensure not retaining   - avoid nephrotoxic agents  - consider nephrology consult   - renally adjust medications     Troponin Elevation - Likely Type II MI   Patient found to have a initial high sensitive troponin of 103 --> 132 --> 150 --> 164 -->152.  Patient denied any chest pain.  EKG without frank signs of ischemia.  Elevation likely in the setting of heart failure exacerbation.      Hx of Persistent Atrial Fibrillation   CHA2DS2-VASc score of 5, however after anemia and concern for bleeding patient was not started on Eliquis.  Had a cardioversion back in 01/10/2021.  Seems to be in normal sinus rhythm on exam.  - continue amiodarone 200 mg daily     Hx of Iron Deficiency  Has had iron infusions and been placed on oral supplementation in the past. Repeat iron panel this admission still showing evidence of some iron deficiency.   - can consider IV iron supplementation     Chronic Problems  Chronic Pain: Followed by the outpatient pain clinic [facility] in [place]. Takes OxyContin 12 mg 2 times a day, can continue current regimen inpatient with bowel regimen ordered.  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 In the setting of acute injury on top of [name], will manage with SSI for now.   Gout: Recent flare of left elbow and wrist. Treated with prednisone about two weeks ago. Not currently in flare at the moment. Can consider allopurinol initiation (limited by eGFR).    Issues Impacting Complexity of Management:  -Intensive monitoring of drug toxicity from Lasix with scheduled BMP and Bumex with scheduled BMP  -  Need for escalation to higher hospital-level of care from [facility] to [facility] for access to specialized services and increased monitoring  -High risk of complications from pain and/or analgesia likely to result in delirium  -The patient is at high risk from Hospital immobility in an elderly patient given baseline poor functional status with a high risk of causing delirium and further decline in function    Medical Decision Making: Reviewed records from the following unique sources  [facility] provider documentation, and discussed management with provider.      Daily Checklist:  Diet: Regular Diet  DVT PPx:  heparin 7500 units q12 hrs  Electrolytes: Replete Potassium to >/=4 and Magnesium to >/=2  Code Status: Full Code  Dispo:  Transfer to Acute Care at [facility]    Team Contact Information:   Primary Team: Geriatrics (MEDA)  Primary Resident: Julious Payer, MD, MD  Resident's Pager: [phone] (Geriatrics Senior Resident)    Interval History:   No acute events overnight.      Patient went to [facility] on morning of 8/22 however has had minimal output despite escalating doses of diuretics. Patient feeling okay, but when discussing with daughter has some dyspnea with exertion (not too different from her baseline), but feels like the abdominal distention where she normally holds fluid.     Denies chest pain, but endorses abdominal distention. Still having Bm's (able to have one this morning).   ROS: otherwise negative    Objective:   Temp:  [36.4 ??C (97.6 ??F)-37 ??C (98.6 ??F)] 36.4 ??C (97.6 ??F)  Heart Rate:  [61-68] 68  SpO2 Pulse:  [65] 65  Resp:  [18-20] 18  BP: (104-141)/(56-73) 132/68  SpO2:  [88 %-96 %] 92 %    Gen: NAD, converses appropriately  HENT: atraumatic, normocephalic  Heart: RRR, no rubs/gallops  Lungs: CTAB, minimal crackles on exam, no wheezes, in NAD on 4L [place]  Abdomen: soft, nontender, but distended, normoactive BS  Extremities: No gross pitting edema in lower legs, left forearm/hand in splint

## 2023-08-21 NOTE — Unmapped (Signed)
Pt admitted/transported from [facility]. AxO 4. Pt normally on 3L baseline, currently 4L [place]. Continent x2. I/O's recorded. Pt's daughter arrived w/ and stayed the night w/ the patient. Dyspnea at rest. Uses walker to ambulate. Ambulates to the bathroom. RT placed BiPap on pt.. Daughter reports patient has had minimal urine output today.    6CIT Score for Cognitive Screening:  Complete for all new admissions, scoring found on the back of the Kardex  If a patient has known dementia, no need to complete the 6CIT  [] Patient has dementia  [x] Normal 6CIT (<8)    [] Significant 6CIT (8 or higher)  [] Needs to be completed    CAM Delirium Screening Tool  Be sure to ask questions to gauge orientation and attention during your assessment  Feature Present? Scoring: CAM Positive if:   Acute onset (different from baseline) with Fluctuating course [] Yes    [x] No   Yes to BOTH of these AND.Marland KitchenMarland Kitchen   Inattention (days of the week or months of the year backward) [] Yes    [x] No    Disorganized thinking [] Yes    [x] No   Yes to at least   ONE of these   Altered level of consciousness  [] Yes    [x] No    CAM []  Positive  [] Negative    CAM-S: Symptom Severity Score  Complete on all patients ?82 years old, including in patients with dementia  Do not complete if the patient is known to be non-verbal due to their dementia  Feature  Severity Score  Question   ACUTE ONSET  & FLUCTUATING  COURSE [x] No (0)    [] Yes (1)    Is there evidence of an acute change in mental status from the patient's baseline? Did the patient's behavior fluctuate at any point during the interview for any of the 10 features?   2. INATTENTION [x] No (0)    [] Yes, mild (1)  [] Yes, marked (2)  Did the patient have difficulty focusing attention, for example being easily distractible, or having difficulty keeping track of what was being said?   3. DISORGANIZED  THINKING [x] No (0)    [] Yes, mild (1)  [] Yes, marked (2)  Was the patient's thinking disorganized or incoherent, such as rambling or irrelevant conversation, unclear or illogical flow or of ideas, unpredictable switching from subject to subject?   4. ALTERED LEVEL  OF  CONSCIOUSNESS [x] Normal (0)    [] Mild: vigilant or lethargic (1)  [] Marked: stupor or coma (2)    Overall, how would you rate the patient's level of consciousness?   -Alert (normal)   -Vigilant  -Lethargic  -Stupor  -Coma  -Uncertain   5. DISORIENTATION [x] No (0)    [] Yes, mild (1)  [] Yes, marked (2)  Was the patient disoriented at any time during the interview, such as thinking he/she was somewhere other than the hospital, using the wrong bed, or misjudging the time of day?   6. MEMORY  IMPAIRMENT [x] No (0)    [] Yes, mild (1)  [] Yes, marked (2)    Did the patient demonstrate any memory problems during the interview, such as inability to remember events in the hospital or difficulty remembering instructions?   7. PERCEPTUAL  DISTURBANCES [x] No (0)    [] Yes, mild (1)  [] Yes, marked (2)  Did the patient have any evidence of perceptual disturbances, for example, hallucinations, illusions, or misinterpretations (such as thinking something was moving when it was not)?   8. PSYCHOMOTOR  AGITATION [x] No (0)    [] Yes,  mild (1)  [] Yes, marked (2)  At any time during the interview, did the patient have an unusually increased level of motor activity, such as restlessness, picking at bedclothes, tapping fingers, or making frequent sudden changes of position?   9. PSYCHOMOTOR  RETARDATION [x] No (0)    [] Yes, mild (1)  [] Yes, marked (2)  At any time during the interview, did the patient have an unusually decreased level of motor activity, such as sluggishness, staring into space, staying in one position for a long time, or moving very slowly?   10. ALTERED  SLEEP-WAKE  CYCLE  [x] No (0)    [] Yes, mild (1)  [] Yes, marked (2)  Did the patient have evidence of disturbance of the sleep-wake cycle, such as excessive daytime sleepiness with insomnia at   night?   Long Form SEVERITY SCORE:  Severity Score (add rows 1-10)  Total (0-19):     Scoring the CAM-S: Rate each symptom of delirium listed in the CAM instrument as absent (0), mild (1), marked (2). Acute onset or fluctuation is rated as absent (0) or present (1). Summarize these scores into a composite.     Problem: Adult Inpatient Plan of Care  Goal: Plan of Care Review  Outcome: Ongoing - Unchanged  Goal: Patient-Specific Goal (Individualized)  Outcome: Ongoing - Unchanged  Goal: Absence of Hospital-Acquired Illness or Injury  Outcome: Ongoing - Unchanged  Goal: Optimal Comfort and Wellbeing  Outcome: Ongoing - Unchanged  Goal: Readiness for Transition of Care  Outcome: Ongoing - Unchanged  Goal: Rounds/Family Conference  Outcome: Ongoing - Unchanged     Problem: Fall Injury Risk  Goal: Absence of Fall and Fall-Related Injury  Outcome: Ongoing - Unchanged     Problem: Self-Care Deficit  Goal: Improved Ability to Complete Activities of Daily Living  Outcome: Ongoing - Unchanged     Problem: Heart Failure  Goal: Optimal Coping  Outcome: Ongoing - Unchanged  Goal: Optimal Cardiac Output  Outcome: Ongoing - Unchanged  Goal: Stable Heart Rate and Rhythm  Outcome: Ongoing - Unchanged  Goal: Optimal Functional Ability  Outcome: Ongoing - Unchanged  Goal: Fluid and Electrolyte Balance  Outcome: Ongoing - Unchanged  Goal: Improved Oral Intake  Outcome: Ongoing - Unchanged  Goal: Effective Oxygenation and Ventilation  Outcome: Ongoing - Unchanged  Goal: Effective Breathing Pattern During Sleep  Outcome: Ongoing - Unchanged     Problem: Infection  Goal: Absence of Infection Signs and Symptoms  Outcome: Ongoing - Unchanged

## 2023-08-21 NOTE — Unmapped (Addendum)
Paitent transferred to [facility]. She was initially admitted 8/21 for acute on chronic respiratory failure/distress. She received two doses of high dose IV lasix with very little UOP. Upon arrival home with [facility], bumex 3mg  IV was ordered. She received her first dose on 8/23 AM. She had very little UOP throughout the day. Creatinine increased to 4, but this was drawn prior to her receiving the first dose of bumex.    Patient's daughter called in this evening around 9:45 due to concern that patient is feeling poorly, specifically feeling more short of breath, with increased abd distension, 2+ BLE edema. She has had minimal UOP today despite receiving IV bumex. BP 107/65. HR 62. O2 SAT 92% on 3l and 96% on 4l .    [facility] to family request as well as for diuretic tritration and close monitoring of vital signs, I/Os, daily weights, and respiratory status.

## 2023-08-21 NOTE — Unmapped (Signed)
PHYSICAL THERAPY  Evaluation (08/21/23 1348)          Patient Name:  Barbara Huber       Medical Record Number: [id number]   Date of Birth: 1941-11-03  Sex: Female        Post-Discharge Physical Therapy Recommendations:  PT Post Acute Discharge Recommendations: Skilled PT services indicated, 3x weekly, Supervision for all mobility due to fall risk   Equipment Recommendation  PT DME Recommendations: Defer to post acute          Treatment Diagnosis: Generalized muscle weakness        Activity Tolerance: Limited by fatigue     ASSESSMENT  Problem List: Fall risk, Decreased mobility, Decreased strength, Shortness of breath, Decreased endurance      Assessment : Barbara Huber is a 82 y.o. female whose presentation is complicated by [name], cardiac amyloidosis, pulmonary hypertension, chronic respiratory failure on 3 L, COPD, [name] stage IV, atrial fibrillation, DM II that presented to [facility] with shortness of breath found to be in a heart failure exacerbation. Transferred from hospital at home back to [facility] with inadequate diuresis and continued shortness of breath.     Pt presents to acute PT below baseline with noted deficits of decreased strength, decreased endurance, SOB, and decreased functional mobility. At this time, pt requires Mod/MinA for transfers and CGA for short distance ambulation with a RW. Pt limited to further mobility this session due to SOB and weakness with RLE knee buckling.      Pt would continue to benefit from acute PT in order to address ongoing above objective findings. Pt would benefit from 3x with 24/7 assistance for postacute recommendations in order to maximize strength and function.        Today's Interventions: PT Eval, transfers, ambulation, ed re: PT role, POC, safety precautions, progressive OOB mobility     Personal Factors/Comorbidities Present: 3+   Examination of Body System: Cardiovascular, Musculoskeletal, Activity/participation, Pulmonary  Clinical Presentation: Evolving    Clinical Decision Making: Moderate        PLAN  Planned Frequency of Treatment: Plan of Care Initiated: 08/21/23  1-2x per day for: 3-4x week        Planned Interventions: Education (Patient/Family/Caregiver), Self-care / Home Management training, Gait training, Therapeutic Exercise, Home exercise program, Therapeutic Activity     Goals:   Patient and Family Goals: To get stronger     SHORT GOAL #1: Pt will perform bed mobility with Mod-I               Time Frame : 2 weeks  SHORT GOAL #2: Pt will perform sit<>stand SBA with LRAD              Time Frame : 2 weeks  SHORT GOAL #3: Pt will ambulate 28ft SBA with LRAD              Time Frame : 2 weeks                                                    Prognosis:  Fair  Positive Indicators: Cg support, motivation  Barriers to Discharge: None     SUBJECTIVE   ,     Patient reports: Agreeable to PT I need it     Services patient receives prior to admission: PT, OT  Prior Functional Status: mod I with use of rollator for household distances, uses manual w/c in community, only leaves the house to go to doctor appts. Barbara Huber works first shift and Barbara Huber works 3rd shift (Barbara Huber sleeping during the day), fell when she was turning in the kitchen. no other fall hx. wears 3L at home all the time  Equipment available at home: Rollator        Past Medical History:   Diagnosis Date    Acute kidney injury superimposed on chronic kidney disease (CMS-HCC) 10/11/2020    Acute on chronic diastolic (congestive) heart failure (CMS-HCC) 08/23/2020    Acute on chronic diastolic congestive heart failure (CMS-HCC)     AKI (acute kidney injury) (CMS-HCC) 04/14/2015    Lab Results  Component  Value  Date     CREATININE  1.90 (H)  06/12/2021     Had a bump in her creatinine when she was taking her diuretics every day.  She is currently taking 40 mg daily of torsemide and 50 mg of spironolactone.  Her volume status is fragile.  Previously when she stopped her diuretic she becomes short of breath.  Plan: We will check her BMP today.  We will likely have to go to 40    Anemia 08/22/2019    Iron deficiency anemia          Lab Results      Component    Value    Date           WBC    9.2    03/21/2021           RBC    3.67 (L)    03/21/2021           HGB    9.9 (L)    03/21/2021           HCT    30.7 (L)    03/21/2021           MCV    83.7    03/21/2021           MCH    26.9    03/21/2021           MCHC    32.1    03/21/2021           RDW    21.9 (H)    03/21/2021           PLT    318     Arthritis     Calculus of kidney     Calculus of ureter     CHF (congestive heart failure) (CMS-HCC)     Chronic atrial fibrillation (CMS-HCC) 07/20/2019    COPD (chronic obstructive pulmonary disease) (CMS-HCC)     Diabetes (CMS-HCC)     Gangrenous cholecystitis 10/11/2020    Generalized edema  06/17/2021    GERD (gastroesophageal reflux disease)     Hydronephrosis     Hypertension     Hyponatremia 10/11/2020    Intermediate coronary syndrome (CMS-HCC) 03/13/2014    Lower extremity edema 09/28/2020    Lumbar stenosis     Microscopic hematuria     Nausea alone     Nephrolithiasis 04/17/2016    Neuropathy     Nocturia     Nocturia 07/01/2017    Other chronic cystitis     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  See plans for anemia, AKI  We are also tapering her gabapentin.  Currently  on 300 mg 3 times daily.  We will decrease it to twice daily, and then nightly.  She states she is not having any recurrence of her pain.    Pulmonary hypertension (CMS-HCC)     Renal colic     Shortness of breath 04/28/2020    Sleep apnea     Unstable angina pectoris (CMS-HCC) 03/13/2014            Social History     Tobacco Use    Smoking status: Former     Current packs/day: 0.00     Types: Cigarettes     Quit date: 2019     Years since quitting: 5.6    Smokeless tobacco: Never    Tobacco comments:     Quit a few years ago   Substance Use Topics    Alcohol use: No       Past Surgical History:   Procedure Laterality Date    BACK SURGERY  1995    CARPAL TUNNEL RELEASE Left 2014    HYSTERECTOMY  1971    IR INSERT CHOLECYSTOSMY TUBE PERCUTANEOUS  10/02/2020    IR INSERT CHOLECYSTOSMY TUBE PERCUTANEOUS 10/02/2020 Braulio Conte, MD IMG [facility]IR [facility]    LUMBAR DISC SURGERY      PR REMOVAL GALLBLADDER N/A 10/06/2020    Procedure: CHOLECYSTECTOMY;  Surgeon: Katherina Mires, MD;  Location: MAIN OR [facility];  Service: Trauma    PR RIGHT HEART CATH O2 SATURATION & CARDIAC OUTPUT N/A 09/30/2020    Procedure: Right Heart Catheterization;  Surgeon: Neal Dy, MD;  Location: [facility] CATH;  Service: Cardiology    PR RIGHT HEART CATH O2 SATURATION & CARDIAC OUTPUT N/A 01/09/2021    Procedure: Right Heart Catheterization;  Surgeon: Lesle Reek, MD;  Location: [facility] CATH;  Service: Cardiology             Family History   Problem Relation Age of Onset    Hypertension Mother     Cancer Father         COLON CANCER    No Known Problems Sister     No Known Problems Sister     No Known Problems Brother     No Known Problems Brother     No Known Problems Maternal Aunt     No Known Problems Maternal Uncle     No Known Problems Paternal Aunt     No Known Problems Paternal Uncle     No Known Problems Maternal Grandmother     No Known Problems Maternal Grandfather     No Known Problems Paternal Grandmother     No Known Problems Paternal Grandfather     No Known Problems Other     Anesthesia problems Neg Hx     Broken bones Neg Hx     Clotting disorder Neg Hx     Collagen disease Neg Hx     Diabetes Neg Hx     Dislocations Neg Hx     Fibromyalgia Neg Hx     Gout Neg Hx     Hemophilia Neg Hx     Osteoporosis Neg Hx     Rheumatologic disease Neg Hx     Scoliosis Neg Hx     Severe sprains Neg Hx     Sickle cell anemia Neg Hx     Spinal Compression Fracture Neg Hx     GU problems Neg Hx     Kidney cancer Neg Hx  Prostate cancer Neg Hx         Allergies: Nitrofurantoin and Lipitor [atorvastatin]                  Objective Findings  Precautions / Restrictions  Precautions: Falls precautions  Weight Bearing Status: Non-applicable  Required Braces or Orthoses: Non-applicable     Pain Comments: Reports soreness in R hip     Equipment / Environment: Vascular access (PIV, TLC, Port-a-cath, PICC), Supplemental oxygen (3L O2 [place])     Vitals/Orthostatics : On 3L O2 at baseline, SpO2 82% after ambulation, did not come up with seated rest and PLB, titrated to 4LO2 and increased to 89%+ following long rest     Living Situation  Living Environment: House  Lives With: Barbara Huber, Family (Barbara Huber, son in Social worker (both work))  Home Living: One level home, Walk-in shower, Handicapped height toilet, Grab bars in shower, Programmer, applications, Shower chair with back, Ramped entrance  Caregiver Identified?: Yes  Caregiver Availability: 24 hours  Caregiver Ability: Limited lifting      Cognition: WFL  Cognition comment: pleasant and cooperative  Visual/Perception: Wears Glasses/Contacts  Hearing: No deficit identified     Skin Inspection: Intact where visualized     Upper Extremities  UE ROM: Right WFL, Left WFL  UE Strength: Right WFL, Left WFL  UE comment: BUE grossly 4/5    Lower Extremities  LE ROM: Left Impaired/Limited, Right Impaired/Limited  RLE ROM Impairment: Limited AROM  LLE ROM Impairment: Limited AROM  LE Strength: Right Impaired/Limited, Left Impaired/Limited  LE comment: Pt unable to perform full AROM of BLE's, strength not tested due to this, grossly 3-/5          Sensation: Not tested  Posture: Rounded shoulders    Static Sitting-Level of Assistance: Supervision  Dynamic Sitting-Level of Assistance: Supervision    Static Standing-Level of Assistance: Contact guard  Dynamic Standing - Level of Assistance: Contact guard  Standing Balance comments: CGA with RW for safety      Bed Mobility comments: NT, pt on commode at start of session and returned to recliner at end of session     Transfer comments: sit<>stand with RW ModA from commode, MinA from low bed, CGA from bed at increased height      Gait Level of Assistance: Contact guard assist, steadying assist  Gait Assistive Device: Rolling walker  Gait Distance Ambulated (ft): 30 ft  Skilled Treatment Performed: Pt ambulated 30 ft with RW CGA with multiple standing rest breaks, bending over walker and holding front of walker to try to breathe, increased WOB noted, terminated further ambulation due to pt reporting weakness and subsequent RLE knee buckling.                       Patient at end of session: All needs in reach, In bed, Nurse notified, Lines intact, Friends/Family present    Physical Therapy Session Duration  PT Individual [mins]: 32          AM-PAC-6 click  Help currently need turning over In bed?: None - Modified Independent/Independent  Help currently needed sitting down/standing up from chair with arms? : A lot - Maximum/Moderate Assistance  Help currently needed moving from supine to sitting on edge of bed?: A Little - Minimal/Contact Guard Assist/Supervision  Help currently needed moving to and from bed from wheelchair?: A Little - Minimal/Contact Guard Assist/Supervision  Help currently needed walking in a hospital room?: A Little - Minimal/Contact Guard Assist/Supervision  6 click Score (in points): % of Functional Impairment, Limitation, Restriction  6: 100% impaired, limited, restricted  7-8: At least 80%, but less than 100% impaired, limited restricted  9-13: At least 60%, but less than 80% impaired, limited restricted  14-19: At least 40%, but less than 60% impaired, limited restricted  20-22: At least 20%, but less than 40% impaired, limited restricted  23: At least 1%, but less than 20% impaired, limited restricted  24: 0% impaired, limited restricted        I attest that I have reviewed the above information.  Signed: Dalphine Handing, PT  Filed 08/21/2023

## 2023-08-21 NOTE — Unmapped (Signed)
Problem: Adult Inpatient Plan of Care  Goal: Plan of Care Review  Outcome: Ongoing - Unchanged  Flowsheets (Taken 08/20/2023 2145)  Progress: no change  Plan of Care Reviewed With: patient  Goal: Patient-Specific Goal (Individualized)  Outcome: Ongoing - Unchanged  Goal: Absence of Hospital-Acquired Illness or Injury  Outcome: Ongoing - Unchanged  Goal: Optimal Comfort and Wellbeing  Outcome: Ongoing - Unchanged  Goal: Readiness for Transition of Care  Outcome: Ongoing - Unchanged  Goal: Rounds/Family Conference  Outcome: Ongoing - Unchanged     Problem: Fall Injury Risk  Goal: Absence of Fall and Fall-Related Injury  Outcome: Ongoing - Unchanged     Problem: Self-Care Deficit  Goal: Improved Ability to Complete Activities of Daily Living  Outcome: Ongoing - Unchanged     Problem: Heart Failure  Goal: Optimal Coping  Outcome: Ongoing - Unchanged  Goal: Optimal Cardiac Output  Outcome: Ongoing - Unchanged  Goal: Stable Heart Rate and Rhythm  Outcome: Ongoing - Unchanged  Goal: Optimal Functional Ability  Outcome: Ongoing - Unchanged  Goal: Fluid and Electrolyte Balance  Outcome: Ongoing - Unchanged  Goal: Improved Oral Intake  Outcome: Ongoing - Unchanged  Goal: Effective Oxygenation and Ventilation  Outcome: Ongoing - Unchanged  Goal: Effective Breathing Pattern During Sleep  Outcome: Ongoing - Unchanged     Problem: Infection  Goal: Absence of Infection Signs and Symptoms  Outcome: Ongoing - Unchanged

## 2023-08-21 NOTE — Unmapped (Addendum)
Geriatrics (MEDA) Treatment Plan    Assessment & Plan:   Barbara Huber is a 82 y.o. female whose presentation is complicated by [name], cardiac amyloidosis, pulmonary hypertension, chronic respiratory failure on 3 L, COPD, [name] stage IV, atrial fibrillation, DM II that presented to [name] with shortness of breath found to be in an acute heart failure exacerbation. Transferred from [name] back to [name] with inadequate diuresis and continued shortness of breath.    Principal Problem:    Acute exacerbation of CHF (congestive heart failure) (CMS-HCC)  Active Problems:    Spinal stenosis of lumbar region    COPD (chronic obstructive pulmonary disease) (CMS-HCC)    Sleep apnea in adult    Essential hypertension    Type 2 diabetes mellitus with stage 4 chronic kidney disease, with long-term current use of insulin (CMS-HCC)    Peripheral neuropathy    Chronic respiratory failure with hypoxia (CMS-HCC)    Persistent atrial fibrillation (CMS-HCC)    Chronic kidney disease ([name]), stage IV (severe) (CMS-HCC)    Pulmonary hypertension (CMS-HCC)    Dyspnea on exertion    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)    Familial amyloid heart disease (CMS-HCC)    Transfer Summary: Patient admitted for likely on acute [name] exacerbation with worsening SOB and bodily edema the PM of 8/21. Went to [name] program on morning of 8/22. Notably patient has had very little output with escalation of lasix doses (IV lasix 80, 120, and then IV 3 mg Bumex). Patient endorsing feeling more SOB and fatigued. With desire from patient and family to be back in the hospital, patient transferred back to [name] for further management.     Active Problems    Exacerbation of amyloid cardiomyopathy with G2DD  Type II Pulm HTN with chronic respiratory failure (3L O2 at home)  AKI on [name]4, ?cardiorenal  Previous seen seen both in cardiac amyloid clinic and pulmonary hypertension clinic, but debatable dry weight which is previously documented as 228, 215, and now is substantially below that at 207 on 8/24.  Originally presented with signs and symptoms of volume overload and found to have elevated BNP above baseline with imaging evidence of pulmonary edema.  Initially diuresed at [name] before transition to hospital at home, where she continued to deteriorate.  Transferred back to med a on 8/24 after worsening creatinine with pause in diuresis.  Overall, questionable response to diuresis on morning of 8/24 with metolazone and 120 of Lasix concerning for evolving oliguria or possibly just that she has reached a neutral fluid balance.  The latter is supported by POCUS with lack of B-lines, IVC of only 2 1/2 cm in largest diameter, and no clear abdominal fluid.  LFTs not consistent with congestive hepatopathy.  Increase in BNP could relate to either worsening generalized fluid status or RA dilation with RV dysfunction due to low-preload state.  Creatinine trend is quite worrisome given her very tenuous cardiovascular status, we will try to maintain standing weights around 210 pounds while waiting for some resolution.  Possible she has been tipped into ATN and further diuresis could be quite disadvantageous should not be clearly indicated from a pulmonary status.  -S/p 120 Lasix  + 5 metolazone 8/24 AM, will reassess pending creatinine trend  -Home tafamidis 61 mg   -Current inpatient supply will run out on 8/27, shared services will be refilling for 8/29 but will need family support to get this in the hospital should she still be admitted  -  Daily standing weights and strict ins and outs  -Goal SpO2 greater than 92%, 3 L O2 at home    COPD with chronic respiratory failure   Supposed to be repeating spirometry next spring 2025  - Continue home formulation of Advair and Spiriva  - Albuterol as needed     Hx of Persistent Atrial Fibrillation   CHA2DS2-VASc score of 5, however after anemia and concern for bleeding patient was not started on Eliquis. Had a cardioversion back in 01/10/2021.  Seems to be in normal sinus rhythm on exam.  - continue amiodarone 200 mg daily     Fe deficiency  - ongoing IV repletion    Chronic Problems  Chronic Pain: Followed by the outpatient pain clinic [name] in [name]. Takes OxyContin 12 mg 2 times a day, can continue current regimen inpatient with bowel regimen ordered.  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 In the setting of acute injury on top of [name], will manage with SSI for now.   Gout: Recent flare of left elbow and wrist. Treated with prednisone about two weeks ago. Not currently in flare at the moment. Can consider allopurinol initiation (limited by eGFR).      Vonna Kotyk, MD  Internal Medicine PGY-2

## 2023-08-21 NOTE — Unmapped (Signed)
Pt BIBEMS c/o mid abdominal pain that started last night. Denies N/V or diarrhea. On O2 at 3lpm via [place] at baseline. DTS neg

## 2023-08-22 LAB — CBC
HEMATOCRIT: 36.5 % (ref 34.0–44.0)
HEMOGLOBIN: 11.8 g/dL (ref 11.3–14.9)
MEAN CORPUSCULAR HEMOGLOBIN CONC: 32.5 g/dL (ref 32.0–36.0)
MEAN CORPUSCULAR HEMOGLOBIN: 30 pg (ref 25.9–32.4)
MEAN CORPUSCULAR VOLUME: 92.2 fL (ref 77.6–95.7)
MEAN PLATELET VOLUME: 9.1 fL (ref 6.8–10.7)
PLATELET COUNT: 158 10*9/L (ref 150–450)
RED BLOOD CELL COUNT: 3.95 10*12/L (ref 3.95–5.13)
RED CELL DISTRIBUTION WIDTH: 14.4 % (ref 12.2–15.2)
WBC ADJUSTED: 9.6 10*9/L (ref 3.6–11.2)

## 2023-08-22 LAB — BASIC METABOLIC PANEL
ANION GAP: 9 mmol/L (ref 5–14)
BLOOD UREA NITROGEN: 88 mg/dL — ABNORMAL HIGH (ref 9–23)
BUN / CREAT RATIO: 26
CALCIUM: 9.5 mg/dL (ref 8.7–10.4)
CHLORIDE: 99 mmol/L (ref 98–107)
CO2: 26.1 mmol/L (ref 20.0–31.0)
CREATININE: 3.36 mg/dL — ABNORMAL HIGH
EGFR CKD-EPI (2021) FEMALE: 13 mL/min/{1.73_m2} — ABNORMAL LOW (ref >=60)
GLUCOSE RANDOM: 117 mg/dL (ref 70–179)
POTASSIUM: 4.2 mmol/L (ref 3.4–4.8)
SODIUM: 134 mmol/L — ABNORMAL LOW (ref 135–145)

## 2023-08-22 MED ADMIN — amiodarone (PACERONE) tablet 200 mg: 200 mg | ORAL | @ 14:00:00

## 2023-08-22 MED ADMIN — oxyCODONE (OxyCONTIN) 12 hr crush resistant ER/CR tablet 10 mg: 10 mg | ORAL | @ 01:00:00 | Stop: 2023-09-01

## 2023-08-22 MED ADMIN — acetaminophen (TYLENOL) tablet 500 mg: 1000 mg | ORAL | @ 02:00:00

## 2023-08-22 MED ADMIN — tafamidis cap 61 mg **Patient Supplied Med**: 61 mg | ORAL | @ 14:00:00

## 2023-08-22 MED ADMIN — spironolactone (ALDACTONE) tablet 25 mg: 25 mg | ORAL | @ 14:00:00

## 2023-08-22 MED ADMIN — fluticasone furoate-vilanterol (BREO ELLIPTA) 100-25 mcg/dose inhaler 1 puff: 1 | RESPIRATORY_TRACT | @ 12:00:00

## 2023-08-22 MED ADMIN — umeclidinium (INCRUSE ELLIPTA) 62.5 mcg/actuation inhaler 1 puff: 1 | RESPIRATORY_TRACT | @ 13:00:00

## 2023-08-22 MED ADMIN — gabapentin (NEURONTIN) capsule 300 mg: 300 mg | ORAL | @ 20:00:00

## 2023-08-22 MED ADMIN — polyethylene glycol (MIRALAX) packet 17 g: 17 g | ORAL | @ 14:00:00

## 2023-08-22 MED ADMIN — vitamin A-3,000 mcg RAE (10,000 UNIT) capsule 3,000 mcg of RAE: 3000 ug | ORAL | @ 14:00:00

## 2023-08-22 MED ADMIN — pantoprazole (Protonix) EC tablet 20 mg: 20 mg | ORAL | @ 14:00:00

## 2023-08-22 MED ADMIN — heparin (porcine) 5,000 unit/mL injection 7,500 Units: 7500 [IU] | SUBCUTANEOUS | @ 14:00:00

## 2023-08-22 MED ADMIN — oxyCODONE (OxyCONTIN) 12 hr crush resistant ER/CR tablet 10 mg: 10 mg | ORAL | @ 14:00:00 | Stop: 2023-09-01

## 2023-08-22 MED ADMIN — heparin (porcine) 5,000 unit/mL injection 7,500 Units: 7500 [IU] | SUBCUTANEOUS | @ 01:00:00

## 2023-08-22 MED ADMIN — torsemide (DEMADEX) tablet 60 mg: 60 mg | ORAL | @ 14:00:00

## 2023-08-22 MED ADMIN — senna (SENOKOT) tablet 2 tablet: 2 | ORAL | @ 14:00:00

## 2023-08-22 MED ADMIN — montelukast (SINGULAIR) tablet 10 mg: 10 mg | ORAL | @ 14:00:00

## 2023-08-22 NOTE — Unmapped (Signed)
[name]    Assessment & Plan:   Barbara Huber is a 82 y.o. female whose presentation is complicated by [name], cardiac amyloidosis, pulmonary hypertension, chronic respiratory failure on 3 L, COPD, [name] stage IV, atrial fibrillation, DM II that presented to [facility] with shortness of breath found to be in an acute heart failure exacerbation. Transferred from [facility] back to [facility] with inadequate diuresis and continued shortness of breath.    Principal Problem:    Acute exacerbation of CHF (congestive heart failure) (CMS-HCC)  Active Problems:    Spinal stenosis of lumbar region    COPD (chronic obstructive pulmonary disease) (CMS-HCC)    Sleep apnea in adult    Essential hypertension    Type 2 diabetes mellitus with stage 4 chronic kidney disease, with long-term current use of insulin (CMS-HCC)    Peripheral neuropathy    Chronic respiratory failure with hypoxia (CMS-HCC)    Persistent atrial fibrillation (CMS-HCC)    Chronic kidney disease ([name]), stage IV (severe) (CMS-HCC)    Pulmonary hypertension (CMS-HCC)    Dyspnea on exertion    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)    Familial amyloid heart disease (CMS-HCC)    Transfer Summary: Patient admitted for likely on acute [name] exacerbation with worsening SOB and bodily edema the PM of 8/21. Went to [facility] program on morning of 8/22. Notably patient has had very little output with escalation of lasix doses (IV lasix 80, 120, and then IV 3 mg Bumex). Patient endorsing feeling more SOB and fatigued. With desire from patient and family to be back in the hospital, patient transferred back to [facility] for further management.     Active Problems    Acute on Chronic [name] Exacerbation - Grade II DD - Cardiac Amyloidosis - Type II Pulm HTN  Patient follows with cardiology outpatient in the cardiac amyloid clinic and has been seen in pulm HTN clinic as well. Previous diuretic regimen of 40 mg every morning of torsemide, but 20 mg torsemide in PM.  Last echocardiogram from 02/17/2023 with LVEF of greater than 70%, mildly increased wall of LV, dilated LA, mild pulmonary hypertension. On presentation patient more SOB, and found to have BNP of 342.57 (up from 118.62 from 07/14/23), some abdominal distension, elevated JVD and hepatojugular reflux with chest x-ray showing pulmonary edema with possible small bilateral effusions, consistent with heart failure exacerbation. Echocardiogram 08/19/23 with LVEF of 70%, moderate mitral annular calcification, moderately dilated LA, normal RV function. HF exacerbation thought to be in setting of decreased diuretic dosing for the past few days as well as some water retention with prednisone usage. With goal for improvement of volume status with increased [name] dosing (IV lasix 80, 120, IV bumex 3 mg, metolazone 5 mg) patient has had limited urine output. Patient's weight seemed to have downtrended (8/22 210 lbs --> 207 lbs) with unclear dry weight, and patient's lung exam improved from prior now without rales. She is at home oxygen requirements of 3L [place] and endorsed benefit from trial of CPAP last night 8/24. Renal function improving since 8/23 elevated Cr prerenal vs cardiorenal by FeUrea. BNP elevation [name] vs RH dysfunction iso pulm HTN and preload dependency. She remains HDS. Volume exam difficult but may be approaching euvolemic. Will discuss with cardiology utility of RHC while inpatient to guide future optimization given changes in overall body weight and consideration for progressing component of [name].  Workup  hyponatremia 134 from 133 yesterday  Cr 3.61  from 3.82  8/24 CXR pulm congestion, cardiomegaly overall stable from previous.   8/24 POCUS no B lines, no ascites, 2.5 cm IVC  FeUrea prerenal pattern  BNP 516 from 342 from baseline 118  Lft wnl  8/24 KUB mild stool, no obstruction  Continue home tafamidis 61 mg daily   Undergoes injections of amvuttra 25 mg every 3 months, last infused last month (07/14/2023)  Jardiance held with frequent UTI's  I/Os  Daily Wt  K >4, Mg >2   Low Na diet  Start scheduled Senna 2 tablets BID and 17g miralax daily  Hold [name]  Restart home spironolactone 25 mg daily  Restart home torsemide 60 mg consolidated dose daily.  Consult Cards 8/26     Chronic Respiratory Failure - COPD - Pulmonary HTN   Patient with history of chronic respiratory failure on home 3 to 4 L daily.  Denies any cough, and no wheezing on presentation to suggest COPD as etiology to shortness of breath, and had finished course of steroids for gout close to admission which should help if due to COPD. Supposed to be repeating spirometry next spring 2025. Endorsed some benefit from CPAP 8/24.  Continue home formulation of Advair and Spiriva  Albuterol as needed  Management of heart failure exacerbation and pulm hypertension as above  Continued use of incentive spirometry    Acute Kidney Injury on [name] Stage IV  Follows with nephrology outpatient. [name] thought to be due to diabetes and hypertension. Thought not to have amyloid infiltration of kidneys given the type of amyloid.  Baseline seems to be around 2.5-3, however had been closer to 2.5 previously. Presented with Cr of 3.12. Cr peaked at 4 on 8/23 and has been downtrending.Cardiorenal vs prerenal or mix based on FeUrea.   daily BMP   diuresis as above  avoid nephrotoxic agents  Consider nephrology consult   renally adjust medications     Troponin Elevation - Likely Type II MI   Patient found to have a initial high sensitive troponin of 103 --> 132 --> 150 --> 164 -->152.  Patient denied any chest pain.  EKG without frank signs of ischemia.  Elevation likely in the setting of heart failure exacerbation.      Hx of Persistent Atrial Fibrillation   CHA2DS2-VASc score of 5, however after anemia and concern for bleeding patient was not started on Eliquis.  Had a cardioversion back in 01/10/2021.  Seems to be in normal sinus rhythm on exam.  Continue amiodarone 200 mg daily     Hx of Iron Deficiency  Has had iron infusions and been placed on oral supplementation in the past. Repeat iron panel this admission still showing evidence of some iron deficiency.   PO iron supplementation at discharge    Chronic Problems  Chronic Pain: Followed by the outpatient pain clinic [facility] in [place]. Takes OxyContin 12 mg 2 times a day, can continue current regimen inpatient with bowel regimen ordered.  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 In the setting of acute injury on top of [name], will manage with SSI for now. Discontinued SSI given average 140-140 and no SSI since start 8/21  Gout: Recent flare of left elbow and wrist. Treated with prednisone about two weeks ago. Not currently in flare at the moment. Can consider  allopurinol initiation (limited by eGFR).    Issues Impacting Complexity of Management:  -Intensive monitoring of drug toxicity from Lasix with scheduled BMP and Bumex with scheduled BMP  -Need for escalation to higher hospital-level of care from [facility] to [facility] for access to specialized services and increased monitoring  -High risk of complications from pain and/or analgesia likely to result in delirium  -The patient is at high risk from Hospital immobility in an elderly patient given baseline poor functional status with a high risk of causing delirium and further decline in function    Medical Decision Making: Reviewed records from the following unique sources  [facility] provider documentation, and discussed management with provider.      Daily Checklist:  Diet: Regular Diet  DVT PPx:  heparin 7500 units q12 hrs  Electrolytes: Replete Potassium to >/=4 and Magnesium to >/=2  Code Status: Full Code  Dispo:  Transfer to Acute Care at [facility]    Team Contact Information:   Primary Team: Geriatrics (MEDA)  Primary Resident: Rollen Sox, MD, MD  Resident's Pager: [phone] (Geriatrics Senior Resident)    Interval History:   No acute events overnight.      Subjectively feels better. Less dyspnea. Feel CPAP may have helped during the night. No new concerns or pains    ROS: otherwise negative    Objective:   Temp:  [35.9 ??C (96.7 ??F)-36.7 ??C (98.1 ??F)] 35.9 ??C (96.7 ??F)  Heart Rate:  [62-70] 62  Resp:  [16-18] 16  BP: (117-140)/(59-65) 117/59  SpO2:  [93 %-100 %] 93 %    Gen: NAD, converses appropriately  HENT: atraumatic, normocephalic  Heart: RRR, no rubs/gallops  Lungs: No rales or rhonchi, slight intermittent wheezing in NAD on 3L [place]  Abdomen: soft, nontender, but distended, normoactive BS  Extremities: No gross pitting edema in lower legs, left forearm/hand in splint

## 2023-08-22 NOTE — Unmapped (Signed)
6CIT Score for Cognitive Screening:  Complete for all new admissions, scoring found on the back of the Kardex  If a patient has known dementia, no need to complete the 6CIT  [] Patient has dementia  [x] Normal 6CIT (<8)    [] Significant 6CIT (8 or higher)  [] Needs to be completed    CAM Delirium Screening Tool  Be sure to ask questions to gauge orientation and attention during your assessment  Feature Present? Scoring: CAM Positive if:   Acute onset (different from baseline) with Fluctuating course [] Yes    [x] No   Yes to BOTH of these AND.Marland KitchenMarland Kitchen   Inattention (days of the week or months of the year backward) [] Yes    [x] No    Disorganized thinking [] Yes    [x] No   Yes to at least   ONE of these   Altered level of consciousness  [] Yes    [x] No    CAM []  Positive  [x] Negative    CAM-S: Symptom Severity Score  Complete on all patients ?82 years old, including in patients with dementia  Do not complete if the patient is known to be non-verbal due to their dementia  Feature  Severity Score  Question   ACUTE ONSET  & FLUCTUATING  COURSE [x] No (0)    [] Yes (1)    Is there evidence of an acute change in mental status from the patient's baseline? Did the patient's behavior fluctuate at any point during the interview for any of the 10 features?   2. INATTENTION [x] No (0)    [] Yes, mild (1)  [] Yes, marked (2)  Did the patient have difficulty focusing attention, for example being easily distractible, or having difficulty keeping track of what was being said?   3. DISORGANIZED  THINKING [x] No (0)    [] Yes, mild (1)  [] Yes, marked (2)  Was the patient's thinking disorganized or incoherent, such as rambling or irrelevant conversation, unclear or illogical flow or of ideas, unpredictable switching from subject to subject?   4. ALTERED LEVEL  OF  CONSCIOUSNESS [x] Normal (0)    [] Mild: vigilant or lethargic (1)  [] Marked: stupor or coma (2)    Overall, how would you rate the patient's level of consciousness?   -Alert (normal) -Vigilant  -Lethargic  -Stupor  -Coma  -Uncertain   5. DISORIENTATION [x] No (0)    [] Yes, mild (1)  [] Yes, marked (2)  Was the patient disoriented at any time during the interview, such as thinking he/she was somewhere other than the hospital, using the wrong bed, or misjudging the time of day?   6. MEMORY  IMPAIRMENT [x] No (0)    [] Yes, mild (1)  [] Yes, marked (2)    Did the patient demonstrate any memory problems during the interview, such as inability to remember events in the hospital or difficulty remembering instructions?   7. PERCEPTUAL  DISTURBANCES [x] No (0)    [] Yes, mild (1)  [] Yes, marked (2)  Did the patient have any evidence of perceptual disturbances, for example, hallucinations, illusions, or misinterpretations (such as thinking something was moving when it was not)?   8. PSYCHOMOTOR  AGITATION [x] No (0)    [] Yes, mild (1)  [] Yes, marked (2)  At any time during the interview, did the patient have an unusually increased level of motor activity, such as restlessness, picking at bedclothes, tapping fingers, or making frequent sudden changes of position?   9. PSYCHOMOTOR  RETARDATION [x] No (0)    [] Yes, mild (1)  [] Yes, marked (2)  At any time during  the interview, did the patient have an unusually decreased level of motor activity, such as sluggishness, staring into space, staying in one position for a long time, or moving very slowly?   10. ALTERED  SLEEP-WAKE  CYCLE  [x] No (0)    [] Yes, mild (1)  [] Yes, marked (2)  Did the patient have evidence of disturbance of the sleep-wake cycle, such as excessive daytime sleepiness with insomnia at   night?   Long Form   SEVERITY SCORE:  Severity Score (add rows 1-10)  Total (0-19):     Scoring the CAM-S: Rate each symptom of delirium listed in the CAM instrument as absent (0), mild (1), marked (2). Acute onset or fluctuation is rated as absent (0) or present (1). Summarize these scores into a composite.     A&O. VSS. No c/o  pain at this time. BiPap placed by RT. Daughter is at bedside. ACHS without need for coverage. Patient took all meds without issue. Plan off care is ongoing.       Problem: Adult Inpatient Plan of Care  Goal: Plan of Care Review  Outcome: Progressing  Goal: Patient-Specific Goal (Individualized)  Outcome: Progressing  Goal: Absence of Hospital-Acquired Illness or Injury  Outcome: Progressing  Intervention: Identify and Manage Fall Risk  Recent Flowsheet Documentation  Taken 08/21/2023 2000 by Jaquelyn Bitter, RN  Safety Interventions:   low bed   fall reduction program maintained   family at bedside   room near unit station  Intervention: Prevent Skin Injury  Recent Flowsheet Documentation  Taken 08/21/2023 2000 by Jaquelyn Bitter, RN  Positioning for Skin: Supine/Back  Goal: Optimal Comfort and Wellbeing  Outcome: Progressing  Goal: Readiness for Transition of Care  Outcome: Progressing  Goal: Rounds/Family Conference  Outcome: Progressing     Problem: Fall Injury Risk  Goal: Absence of Fall and Fall-Related Injury  Outcome: Progressing  Intervention: Promote Scientist, clinical (histocompatibility and immunogenetics) Documentation  Taken 08/21/2023 2000 by Jaquelyn Bitter, RN  Safety Interventions:   low bed   fall reduction program maintained   family at bedside   room near unit station     Problem: Heart Failure  Goal: Optimal Coping  Outcome: Progressing  Goal: Optimal Cardiac Output  Outcome: Progressing  Goal: Stable Heart Rate and Rhythm  Outcome: Progressing  Goal: Optimal Functional Ability  Outcome: Progressing  Goal: Fluid and Electrolyte Balance  Outcome: Progressing  Goal: Improved Oral Intake  Outcome: Progressing  Goal: Effective Oxygenation and Ventilation  Outcome: Progressing  Intervention: Optimize Oxygenation and Ventilation  Recent Flowsheet Documentation  Taken 08/21/2023 2000 by Jaquelyn Bitter, RN  Head of Bed [facility]) Positioning: HOB elevated  Goal: Effective Breathing Pattern During Sleep  Outcome: Progressing

## 2023-08-22 NOTE — Unmapped (Signed)
Pt in with CHF.  VSS, no c/o pain.  Receiving IV lasix, decreased urinary output.  Decreased po intake.  Skin and falls protocol maintained.  Will continue to work towards prioritized goals.  Problem: Adult Inpatient Plan of Care  Goal: Plan of Care Review  Outcome: Progressing     Problem: Fall Injury Risk  Goal: Absence of Fall and Fall-Related Injury  Outcome: Progressing  Intervention: Promote Scientist, clinical (histocompatibility and immunogenetics) Documentation  Taken 08/21/2023 0800 by Bartholomew Boards, RN  Safety Interventions:   family at bedside   fall reduction program maintained   low bed   nonskid shoes/slippers when out of bed     Problem: Self-Care Deficit  Goal: Improved Ability to Complete Activities of Daily Living  Outcome: Progressing     Problem: Heart Failure  Goal: Optimal Coping  Outcome: Progressing  6CIT Score for Cognitive Screening:  Complete for all new admissions, scoring found on the back of the Kardex  If a patient has known dementia, no need to complete the 6CIT  [] Patient has dementia  [] Normal 6CIT (<8)    [] Significant 6CIT (8 or higher)  [] Needs to be completed    CAM Delirium Screening Tool  Be sure to ask questions to gauge orientation and attention during your assessment  Feature Present? Scoring: CAM Positive if:   Acute onset (different from baseline) with Fluctuating course [] Yes    [x] No   Yes to BOTH of these AND.Marland KitchenMarland Kitchen   Inattention (days of the week or months of the year backward) [] Yes    [x] No    Disorganized thinking [] Yes    [x] No   Yes to at least   ONE of these   Altered level of consciousness  [] Yes    [x] No    CAM []  Positive  [x] Negative    CAM-S: Symptom Severity Score  Complete on all patients ?82 years old, including in patients with dementia  Do not complete if the patient is known to be non-verbal due to their dementia  Feature  Severity Score  Question   ACUTE ONSET  & FLUCTUATING  COURSE [x] No (0)    [] Yes (1)    Is there evidence of an acute change in mental status from the patient's baseline? Did the patient's behavior fluctuate at any point during the interview for any of the 10 features?   2. INATTENTION [x] No (0)    [] Yes, mild (1)  [] Yes, marked (2)  Did the patient have difficulty focusing attention, for example being easily distractible, or having difficulty keeping track of what was being said?   3. DISORGANIZED  THINKING [x] No (0)    [] Yes, mild (1)  [] Yes, marked (2)  Was the patient's thinking disorganized or incoherent, such as rambling or irrelevant conversation, unclear or illogical flow or of ideas, unpredictable switching from subject to subject?   4. ALTERED LEVEL  OF  CONSCIOUSNESS [x] Normal (0)    [] Mild: vigilant or lethargic (1)  [] Marked: stupor or coma (2)    Overall, how would you rate the patient's level of consciousness?   -Alert (normal)   -Vigilant  -Lethargic  -Stupor  -Coma  -Uncertain   5. DISORIENTATION [x] No (0)    [] Yes, mild (1)  [] Yes, marked (2)  Was the patient disoriented at any time during the interview, such as thinking he/she was somewhere other than the hospital, using the wrong bed, or misjudging the time of day?   6. MEMORY  IMPAIRMENT [x] No (0)    [] Yes,  mild (1)  [] Yes, marked (2)    Did the patient demonstrate any memory problems during the interview, such as inability to remember events in the hospital or difficulty remembering instructions?   7. PERCEPTUAL  DISTURBANCES [x] No (0)    [] Yes, mild (1)  [] Yes, marked (2)  Did the patient have any evidence of perceptual disturbances, for example, hallucinations, illusions, or misinterpretations (such as thinking something was moving when it was not)?   8. PSYCHOMOTOR  AGITATION [x] No (0)    [] Yes, mild (1)  [] Yes, marked (2)  At any time during the interview, did the patient have an unusually increased level of motor activity, such as restlessness, picking at bedclothes, tapping fingers, or making frequent sudden changes of position?   9. PSYCHOMOTOR  RETARDATION [x] No (0)    [] Yes, mild (1)  [] Yes, marked (2)  At any time during the interview, did the patient have an unusually decreased level of motor activity, such as sluggishness, staring into space, staying in one position for a long time, or moving very slowly?   10. ALTERED  SLEEP-WAKE  CYCLE  [x] No (0)    [] Yes, mild (1)  [] Yes, marked (2)  Did the patient have evidence of disturbance of the sleep-wake cycle, such as excessive daytime sleepiness with insomnia at   night?   Long Form   SEVERITY SCORE:  Severity Score (add rows 1-10)  Total (0-19): 0    Scoring the CAM-S: Rate each symptom of delirium listed in the CAM instrument as absent (0), mild (1), marked (2). Acute onset or fluctuation is rated as absent (0) or present (1). Summarize these scores into a composite.

## 2023-08-23 LAB — CBC
HEMATOCRIT: 38 % (ref 34.0–44.0)
HEMOGLOBIN: 12.1 g/dL (ref 11.3–14.9)
MEAN CORPUSCULAR HEMOGLOBIN CONC: 31.8 g/dL — ABNORMAL LOW (ref 32.0–36.0)
MEAN CORPUSCULAR HEMOGLOBIN: 29.4 pg (ref 25.9–32.4)
MEAN CORPUSCULAR VOLUME: 92.6 fL (ref 77.6–95.7)
MEAN PLATELET VOLUME: 9 fL (ref 6.8–10.7)
PLATELET COUNT: 186 10*9/L (ref 150–450)
RED BLOOD CELL COUNT: 4.11 10*12/L (ref 3.95–5.13)
RED CELL DISTRIBUTION WIDTH: 14.4 % (ref 12.2–15.2)
WBC ADJUSTED: 10.1 10*9/L (ref 3.6–11.2)

## 2023-08-23 LAB — BASIC METABOLIC PANEL
ANION GAP: 10 mmol/L (ref 5–14)
BLOOD UREA NITROGEN: 91 mg/dL — ABNORMAL HIGH (ref 9–23)
BUN / CREAT RATIO: 30
CALCIUM: 9.7 mg/dL (ref 8.7–10.4)
CHLORIDE: 99 mmol/L (ref 98–107)
CO2: 28.3 mmol/L (ref 20.0–31.0)
CREATININE: 3.06 mg/dL — ABNORMAL HIGH
EGFR CKD-EPI (2021) FEMALE: 15 mL/min/{1.73_m2} — ABNORMAL LOW (ref >=60)
GLUCOSE RANDOM: 95 mg/dL (ref 70–179)
POTASSIUM: 4.2 mmol/L (ref 3.4–4.8)
SODIUM: 137 mmol/L (ref 135–145)

## 2023-08-23 MED ADMIN — lidocaine (ASPERCREME) 4 % 1 patch: 1 | TRANSDERMAL | @ 15:00:00 | Stop: 2023-08-23

## 2023-08-23 MED ADMIN — tafamidis cap 61 mg **Patient Supplied Med**: 61 mg | ORAL | @ 13:00:00 | Stop: 2023-08-23

## 2023-08-23 MED ADMIN — amiodarone (PACERONE) tablet 200 mg: 200 mg | ORAL | @ 13:00:00 | Stop: 2023-08-23

## 2023-08-23 MED ADMIN — oxyCODONE (OxyCONTIN) 12 hr crush resistant ER/CR tablet 10 mg: 10 mg | ORAL | Stop: 2023-09-01

## 2023-08-23 MED ADMIN — senna (SENOKOT) tablet 2 tablet: 2 | ORAL | @ 13:00:00 | Stop: 2023-08-23

## 2023-08-23 MED ADMIN — senna (SENOKOT) tablet 2 tablet: 2 | ORAL

## 2023-08-23 MED ADMIN — heparin (porcine) 5,000 unit/mL injection 7,500 Units: 7500 [IU] | SUBCUTANEOUS

## 2023-08-23 MED ADMIN — umeclidinium (INCRUSE ELLIPTA) 62.5 mcg/actuation inhaler 1 puff: 1 | RESPIRATORY_TRACT | @ 12:00:00 | Stop: 2023-08-23

## 2023-08-23 MED ADMIN — pantoprazole (Protonix) EC tablet 20 mg: 20 mg | ORAL | @ 13:00:00 | Stop: 2023-08-23

## 2023-08-23 MED ADMIN — acetaminophen (TYLENOL) tablet 500 mg: 1000 mg | ORAL | @ 13:00:00 | Stop: 2023-08-23

## 2023-08-23 MED ADMIN — montelukast (SINGULAIR) tablet 10 mg: 10 mg | ORAL | @ 13:00:00 | Stop: 2023-08-23

## 2023-08-23 MED ADMIN — heparin (porcine) 5,000 unit/mL injection 7,500 Units: 7500 [IU] | SUBCUTANEOUS | @ 13:00:00 | Stop: 2023-08-23

## 2023-08-23 MED ADMIN — spironolactone (ALDACTONE) tablet 25 mg: 25 mg | ORAL | @ 13:00:00 | Stop: 2023-08-23

## 2023-08-23 MED ADMIN — polyethylene glycol (MIRALAX) packet 17 g: 17 g | ORAL | @ 13:00:00 | Stop: 2023-08-23

## 2023-08-23 MED ADMIN — torsemide (DEMADEX) tablet 60 mg: 60 mg | ORAL | @ 13:00:00 | Stop: 2023-08-23

## 2023-08-23 MED ADMIN — gabapentin (NEURONTIN) capsule 300 mg: 300 mg | ORAL | @ 15:00:00 | Stop: 2023-08-23

## 2023-08-23 MED ADMIN — vitamin A-3,000 mcg RAE (10,000 UNIT) capsule 3,000 mcg of RAE: 3000 ug | ORAL | @ 13:00:00 | Stop: 2023-08-23

## 2023-08-23 MED ADMIN — fluticasone furoate-vilanterol (BREO ELLIPTA) 100-25 mcg/dose inhaler 1 puff: 1 | RESPIRATORY_TRACT | @ 12:00:00 | Stop: 2023-08-23

## 2023-08-23 MED ADMIN — oxyCODONE (OxyCONTIN) 12 hr crush resistant ER/CR tablet 10 mg: 10 mg | ORAL | @ 13:00:00 | Stop: 2023-08-23

## 2023-08-23 NOTE — Unmapped (Signed)
6CIT Score for Cognitive Screening:  Complete for all new admissions, scoring found on the back of the Kardex  If a patient has known dementia, no need to complete the 6CIT  [] Patient has dementia  [x] Normal 6CIT (<8)              [] Significant 6CIT (8 or higher)  [] Needs to be completed     CAM Delirium Screening Tool  Be sure to ask questions to gauge orientation and attention during your assessment  Feature Present? Scoring: CAM Positive if:   Acute onset (different from baseline) with Fluctuating course [] Yes             [x] No    Yes to BOTH of these AND.Marland KitchenMarland Kitchen   Inattention (days of the week or months of the year backward) [] Yes             [x] No    Disorganized thinking [] Yes             [x] No    Yes to at least   ONE of these   Altered level of consciousness  [] Yes             [x] No    CAM []  Positive  [x] Negative     CAM-S: Symptom Severity Score  Complete on all patients ?82 years old, including in patients with dementia  Do not complete if the patient is known to be non-verbal due to their dementia  Feature  Severity Score  Question   ACUTE ONSET  & FLUCTUATING  COURSE [x] No (0)        [] Yes (1)     Is there evidence of an acute change in mental status from the patient's baseline? Did the patient's behavior fluctuate at any point during the interview for any of the 10 features?   2. INATTENTION [x] No (0)        [] Yes, mild (1)  [] Yes, marked (2)  Did the patient have difficulty focusing attention, for example being easily distractible, or having difficulty keeping track of what was being said?   3. DISORGANIZED  THINKING [x] No (0)        [] Yes, mild (1)  [] Yes, marked (2)  Was the patient's thinking disorganized or incoherent, such as rambling or irrelevant conversation, unclear or illogical flow or of ideas, unpredictable switching from subject to subject?   4. ALTERED LEVEL  OF  CONSCIOUSNESS [x] Normal (0)        [] Mild: vigilant or lethargic (1)  [] Marked: stupor or coma (2)     Overall, how would you rate the patient's level of consciousness?   -Alert (normal)   -Vigilant  -Lethargic  -Stupor  -Coma  -Uncertain   5. DISORIENTATION [x] No (0)        [] Yes, mild (1)  [] Yes, marked (2)  Was the patient disoriented at any time during the interview, such as thinking he/she was somewhere other than the hospital, using the wrong bed, or misjudging the time of day?   6. MEMORY  IMPAIRMENT [x] No (0)        [] Yes, mild (1)  [] Yes, marked (2)     Did the patient demonstrate any memory problems during the interview, such as inability to remember events in the hospital or difficulty remembering instructions?   7. PERCEPTUAL  DISTURBANCES [x] No (0)        [] Yes, mild (1)  [] Yes, marked (2)  Did the patient have any evidence of perceptual disturbances, for example, hallucinations, illusions, or misinterpretations (such  as thinking something was moving when it was not)?   8. PSYCHOMOTOR  AGITATION [x] No (0)        [] Yes, mild (1)  [] Yes, marked (2)  At any time during the interview, did the patient have an unusually increased level of motor activity, such as restlessness, picking at bedclothes, tapping fingers, or making frequent sudden changes of position?   9. PSYCHOMOTOR  RETARDATION [x] No (0)        [] Yes, mild (1)  [] Yes, marked (2)  At any time during the interview, did the patient have an unusually decreased level of motor activity, such as sluggishness, staring into space, staying in one position for a long time, or moving very slowly?   10. ALTERED  SLEEP-WAKE  CYCLE  [x] No (0)        [] Yes, mild (1)  [] Yes, marked (2)  Did the patient have evidence of disturbance of the sleep-wake cycle, such as excessive daytime sleepiness with insomnia at   night?   Long Form   SEVERITY SCORE:  Severity Score (add rows 1-10)  Total (0-19):      Scoring the CAM-S: Rate each symptom of delirium listed in the CAM instrument as absent (0), mild (1), marked (2). Acute onset or fluctuation is rated as absent (0) or present (1). Summarize these scores into a composite.     A&O. VSS. No c/o pain at this time. Daughter is at bedside. Plan of care is ongoing.     Problem: Adult Inpatient Plan of Care  Goal: Plan of Care Review  Outcome: Progressing  Goal: Patient-Specific Goal (Individualized)  Outcome: Progressing  Goal: Absence of Hospital-Acquired Illness or Injury  Outcome: Progressing  Intervention: Identify and Manage Fall Risk  Recent Flowsheet Documentation  Taken 08/22/2023 2030 by Jaquelyn Bitter, RN  Safety Interventions:   low bed   fall reduction program maintained   family at bedside   room near unit station  Intervention: Prevent Skin Injury  Recent Flowsheet Documentation  Taken 08/22/2023 2030 by Jaquelyn Bitter, RN  Positioning for Skin: Supine/Back  Goal: Optimal Comfort and Wellbeing  Outcome: Progressing  Goal: Readiness for Transition of Care  Outcome: Progressing  Goal: Rounds/Family Conference  Outcome: Progressing     Problem: Fall Injury Risk  Goal: Absence of Fall and Fall-Related Injury  Outcome: Progressing  Intervention: Promote Injury-Free Environment  Recent Flowsheet Documentation  Taken 08/22/2023 2030 by Jaquelyn Bitter, RN  Safety Interventions:   low bed   fall reduction program maintained   family at bedside   room near unit station     Problem: Heart Failure  Goal: Optimal Coping  Outcome: Progressing  Goal: Optimal Cardiac Output  Outcome: Progressing  Goal: Stable Heart Rate and Rhythm  Outcome: Progressing  Goal: Optimal Functional Ability  Outcome: Progressing  Goal: Fluid and Electrolyte Balance  Outcome: Progressing  Goal: Improved Oral Intake  Outcome: Progressing  Goal: Effective Oxygenation and Ventilation  Outcome: Progressing  Intervention: Optimize Oxygenation and Ventilation  Recent Flowsheet Documentation  Taken 08/22/2023 2030 by Jaquelyn Bitter, RN  [facility]) Positioning: HOB elevated  Goal: Effective Breathing Pattern During Sleep  Outcome: Progressing

## 2023-08-23 NOTE — Unmapped (Incomplete)
[name]    Assessment & Plan:   Barbara Huber is a 82 y.o. female whose presentation is complicated by [name], cardiac amyloidosis, pulmonary hypertension, chronic respiratory failure on 3 L, COPD, [name] stage IV, atrial fibrillation, DM II that presented to [facility] with shortness of breath found to be in an acute heart failure exacerbation. Transferred from [facility] back to [facility] with inadequate diuresis and continued shortness of breath.    Principal Problem:    Acute exacerbation of CHF (congestive heart failure) (CMS-HCC)  Active Problems:    Spinal stenosis of lumbar region    COPD (chronic obstructive pulmonary disease) (CMS-HCC)    Sleep apnea in adult    Essential hypertension    Type 2 diabetes mellitus with stage 4 chronic kidney disease, with long-term current use of insulin (CMS-HCC)    Peripheral neuropathy    Chronic respiratory failure with hypoxia (CMS-HCC)    Persistent atrial fibrillation (CMS-HCC)    Chronic kidney disease ([name]), stage IV (severe) (CMS-HCC)    Pulmonary hypertension (CMS-HCC)    Dyspnea on exertion    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)    Familial amyloid heart disease (CMS-HCC)    Transfer Summary: Patient admitted for likely on acute [name] exacerbation with worsening SOB and bodily edema the PM of 8/21. Went to [facility] program on morning of 8/22. Notably patient has had very little output with escalation of lasix doses (IV lasix 80, 120, and then IV 3 mg Bumex). Patient endorsing feeling more SOB and fatigued. With desire from patient and family to be back in the hospital, patient transferred back to [facility] for further management.     Active Problems    Acute on Chronic [name] Exacerbation - Grade II DD - Cardiac Amyloidosis - Type II Pulm HTN  Patient follows with cardiology outpatient in the cardiac amyloid clinic and has been seen in pulm HTN clinic as well. Previous diuretic regimen of 40 mg every morning of torsemide, but 20 mg torsemide in PM.  Last echocardiogram from 02/17/2023 with LVEF of greater than 70%, mildly increased wall of LV, dilated LA, mild pulmonary hypertension. On presentation patient more SOB, and found to have BNP of 342.57 (up from 118.62 from 07/14/23), some abdominal distension, elevated JVD and hepatojugular reflux with chest x-ray showing pulmonary edema with possible small bilateral effusions, consistent with heart failure exacerbation. Echocardiogram 08/19/23 with LVEF of 70%, moderate mitral annular calcification, moderately dilated LA, normal RV function. HF exacerbation thought to be in setting of decreased diuretic dosing for the past few days as well as some water retention with prednisone usage. With goal for improvement of volume status with increased [name] dosing (IV lasix 80, 120, IV bumex 3 mg, metolazone 5 mg) patient has had limited urine output. Patient's weight seemed to have downtrended (8/22 210 lbs --> 207 lbs) with unclear dry weight, and patient's lung exam improved from prior now without rales. She is at home oxygen requirements of 3L [place] and endorsed benefit from trial of CPAP last night 8/24. Renal function improving since 8/23 elevated Cr prerenal vs cardiorenal by FeUrea. BNP elevation [name] vs RH dysfunction iso pulm HTN and preload dependency. She remains HDS. Volume exam difficult but may be approaching euvolemic. Will discuss with cardiology utility of RHC while inpatient to guide future optimization given changes in overall body weight and consideration for progressing component of [name].  HDS, on home oxygen baseline. 500 mL UOP yesterday similar  to prev.   Workup  hyponatremia normalized to 137 134 from 133 yesterday  Cr 3.61 from 3.82  8/24 CXR pulm congestion, cardiomegaly overall stable from previous.   8/24 POCUS no B lines, no ascites, 2.5 cm IVC  FeUrea prerenal pattern  BNP 516 from 342 from baseline 118  Lft wnl  8/24 KUB mild stool, no obstruction  Continue home tafamidis 61 mg daily   Undergoes injections of amvuttra 25 mg every 3 months, last infused last month (07/14/2023)  Jardiance held with frequent UTI's  I/Os  Daily Wt  K >4, Mg >2   Low Na diet  Start scheduled Senna 2 tablets BID and 17g miralax daily  Hold [name]  Restart home spironolactone 25 mg daily  Restart home torsemide 60 mg consolidated dose daily.  Consult Cards 8/26     Chronic Respiratory Failure - COPD - Pulmonary HTN   Patient with history of chronic respiratory failure on home 3 to 4 L daily.  Denies any cough, and no wheezing on presentation to suggest COPD as etiology to shortness of breath, and had finished course of steroids for gout close to admission which should help if due to COPD. Supposed to be repeating spirometry next spring 2025. Endorsed some benefit from CPAP 8/24.  Continue home formulation of Advair and Spiriva  Albuterol as needed  Management of heart failure exacerbation and pulm hypertension as above  Continued use of incentive spirometry    Acute Kidney Injury on [name] Stage IV  Follows with nephrology outpatient. [name] thought to be due to diabetes and hypertension. Thought not to have amyloid infiltration of kidneys given the type of amyloid.  Baseline seems to be around 2.5-3, however had been closer to 2.5 previously. Presented with Cr of 3.12. Cr peaked at 4 on 8/23 and has been downtrending.Cardiorenal vs prerenal or mix based on FeUrea.   daily BMP   diuresis as above  avoid nephrotoxic agents  Consider nephrology consult   renally adjust medications     Troponin Elevation - Likely Type II MI   Patient found to have a initial high sensitive troponin of 103 --> 132 --> 150 --> 164 -->152.  Patient denied any chest pain.  EKG without frank signs of ischemia.  Elevation likely in the setting of heart failure exacerbation.      Hx of Persistent Atrial Fibrillation   CHA2DS2-VASc score of 5, however after anemia and concern for bleeding patient was not started on Eliquis. Had a cardioversion back in 01/10/2021.  Seems to be in normal sinus rhythm on exam.  Continue amiodarone 200 mg daily     Hx of Iron Deficiency  Has had iron infusions and been placed on oral supplementation in the past. Repeat iron panel this admission still showing evidence of some iron deficiency.   PO iron supplementation at discharge    Chronic Problems  Chronic Pain: Followed by the outpatient pain clinic [facility] in [place]. Takes OxyContin 12 mg 2 times a day, can continue current regimen inpatient with bowel regimen ordered.  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 In the setting of acute injury on top of [name], will manage with SSI for now. Discontinued SSI given average 140-140 and no SSI since start 8/21  Gout: Recent flare of left elbow and wrist.  Treated with prednisone about two weeks ago. Not currently in flare at the moment. Can consider allopurinol initiation (limited by eGFR).    Issues Impacting Complexity of Management:  -Intensive monitoring of drug toxicity from Lasix with scheduled BMP and Bumex with scheduled BMP  -Need for escalation to higher hospital-level of care from [facility] to [facility] for access to specialized services and increased monitoring  -High risk of complications from pain and/or analgesia likely to result in delirium  -The patient is at high risk from Hospital immobility in an elderly patient given baseline poor functional status with a high risk of causing delirium and further decline in function    Medical Decision Making: Reviewed records from the following unique sources  [facility] provider documentation, and discussed management with provider.      Daily Checklist:  Diet: Regular Diet  DVT PPx:  heparin 7500 units q12 hrs  Electrolytes: Replete Potassium to >/=4 and Magnesium to >/=2  Code Status: Full Code  Dispo:  Transfer to Acute Care at [facility]    Team Contact Information:   Primary Team: Geriatrics (MEDA)  Primary Resident: Rollen Sox, MD, MD  Resident's Pager: [phone] (Geriatrics Senior Resident)    Interval History:   No acute events overnight.      Subjectively feels better. Less dyspnea. Feel CPAP may have helped during the night. No new concerns or pains    ROS: otherwise negative    Objective:   Temp:  [36.6 ??C (97.8 ??F)-37.2 ??C (99 ??F)] 37.2 ??C (99 ??F)  Heart Rate:  [69] 69  Resp:  [18] 18  BP: (135-155)/(67-72) 135/67  SpO2:  [91 %-96 %] 94 %    Gen: NAD, converses appropriately  HENT: atraumatic, normocephalic  Heart: RRR, no rubs/gallops  Lungs: No rales or rhonchi, slight intermittent wheezing in NAD on 3L [place]  Abdomen: soft, nontender, but distended, normoactive BS  Extremities: No gross pitting edema in lower legs, left forearm/hand in splint

## 2023-08-23 NOTE — Unmapped (Signed)
Pt discharge teaching complete. PIV removed. Pt to leave via wheelchair with home O2.  Problem: Adult Inpatient Plan of Care  Goal: Plan of Care Review  Outcome: Resolved  Goal: Patient-Specific Goal (Individualized)  Outcome: Resolved  Goal: Absence of Hospital-Acquired Illness or Injury  08/23/2023 1607 by Rutha Bouchard, RN  Outcome: Resolved  08/23/2023 1516 by Rutha Bouchard, RN  Outcome: Ongoing - Unchanged  Intervention: Identify and Manage Fall Risk  Recent Flowsheet Documentation  Taken 08/23/2023 1400 by Rutha Bouchard, RN  Safety Interventions:   fall reduction program maintained   low bed   nonskid shoes/slippers when out of bed  Intervention: Prevent Skin Injury  Recent Flowsheet Documentation  Taken 08/23/2023 1400 by Cleone Slim Donato Schultz, RN  Positioning for Skin: Sitting in Chair  Goal: Optimal Comfort and Wellbeing  08/23/2023 1607 by Rutha Bouchard, RN  Outcome: Resolved  08/23/2023 1516 by Rutha Bouchard, RN  Outcome: Ongoing - Unchanged  Goal: Readiness for Transition of Care  Outcome: Resolved  Goal: Rounds/Family Conference  Outcome: Resolved     Problem: Fall Injury Risk  Goal: Absence of Fall and Fall-Related Injury  08/23/2023 1607 by Rutha Bouchard, RN  Outcome: Resolved  08/23/2023 1516 by Rutha Bouchard, RN  Outcome: Ongoing - Unchanged  Intervention: Promote Injury-Free Environment  Recent Flowsheet Documentation  Taken 08/23/2023 1400 by Rutha Bouchard, RN  Safety Interventions:   fall reduction program maintained   low bed   nonskid shoes/slippers when out of bed     Problem: Self-Care Deficit  Goal: Improved Ability to Complete Activities of Daily Living  08/23/2023 1607 by Rutha Bouchard, RN  Outcome: Resolved  08/23/2023 1516 by Rutha Bouchard, RN  Outcome: Ongoing - Unchanged     Problem: Heart Failure  Goal: Optimal Coping  Outcome: Resolved  Goal: Optimal Cardiac Output  Outcome: Resolved  Goal: Stable Heart Rate and Rhythm  Outcome: Resolved  Goal: Optimal Functional Ability  Outcome: Resolved  Goal: Fluid and Electrolyte Balance  Outcome: Resolved  Goal: Improved Oral Intake  08/23/2023 1607 by Rutha Bouchard, RN  Outcome: Resolved  08/23/2023 1516 by Rutha Bouchard, RN  Outcome: Ongoing - Unchanged  Goal: Effective Oxygenation and Ventilation  Outcome: Resolved  Goal: Effective Breathing Pattern During Sleep  08/23/2023 1607 by Rutha Bouchard, RN  Outcome: Resolved  08/23/2023 1516 by Rutha Bouchard, RN  Outcome: Ongoing - Unchanged     Problem: Infection  Goal: Absence of Infection Signs and Symptoms  08/23/2023 1607 by Rutha Bouchard, RN  Outcome: Resolved  08/23/2023 1516 by Rutha Bouchard, RN  Outcome: Ongoing - Unchanged

## 2023-08-23 NOTE — Unmapped (Signed)
Pt A&O x4 with no c/o pain or discomfort. VSS. Fall precautions maintained.  All care explained, all questions addressed. Call bell in reach.   ..6CIT Score for Cognitive Screening:  Complete for all new admissions, scoring found on the back of the Kardex  If a patient has known dementia, no need to complete the 6CIT  [] Patient has dementia  [] Normal 6CIT (<8)    [] Significant 6CIT (8 or higher)  [] Needs to be completed    CAM Delirium Screening Tool  Be sure to ask questions to gauge orientation and attention during your assessment  Feature Present? Scoring: CAM Positive if:   Acute onset (different from baseline) with Fluctuating course [] Yes    [] No   Yes to BOTH of these AND.Marland KitchenMarland Kitchen   Inattention (days of the week or months of the year backward) [] Yes    [] No    Disorganized thinking [] Yes    [] No   Yes to at least   ONE of these   Altered level of consciousness  [] Yes    [] No    CAM []  Positive  [] Negative    CAM-S: Symptom Severity Score  Complete on all patients ?82 years old, including in patients with dementia  Do not complete if the patient is known to be non-verbal due to their dementia  Feature  Severity Score  Question   ACUTE ONSET  & FLUCTUATING  COURSE [x] No (0)    [] Yes (1)    Is there evidence of an acute change in mental status from the patient's baseline? Did the patient's behavior fluctuate at any point during the interview for any of the 10 features?   2. INATTENTION [x] No (0)    [] Yes, mild (1)  [] Yes, marked (2)  Did the patient have difficulty focusing attention, for example being easily distractible, or having difficulty keeping track of what was being said?   3. DISORGANIZED  THINKING [x] No (0)    [] Yes, mild (1)  [] Yes, marked (2)  Was the patient's thinking disorganized or incoherent, such as rambling or irrelevant conversation, unclear or illogical flow or of ideas, unpredictable switching from subject to subject?   4. ALTERED LEVEL  OF  CONSCIOUSNESS [x] Normal (0)    [] Mild: vigilant or lethargic (1)  [] Marked: stupor or coma (2)    Overall, how would you rate the patient's level of consciousness?   -Alert (normal)   -Vigilant  -Lethargic  -Stupor  -Coma  -Uncertain   5. DISORIENTATION [x] No (0)    [] Yes, mild (1)  [] Yes, marked (2)  Was the patient disoriented at any time during the interview, such as thinking he/she was somewhere other than the hospital, using the wrong bed, or misjudging the time of day?   6. MEMORY  IMPAIRMENT [x] No (0)    [] Yes, mild (1)  [] Yes, marked (2)    Did the patient demonstrate any memory problems during the interview, such as inability to remember events in the hospital or difficulty remembering instructions?   7. PERCEPTUAL  DISTURBANCES [x] No (0)    [] Yes, mild (1)  [] Yes, marked (2)  Did the patient have any evidence of perceptual disturbances, for example, hallucinations, illusions, or misinterpretations (such as thinking something was moving when it was not)?   8. PSYCHOMOTOR  AGITATION [x] No (0)    [] Yes, mild (1)  [] Yes, marked (2)  At any time during the interview, did the patient have an unusually increased level of motor activity, such as restlessness, picking at bedclothes, tapping fingers, or making  frequent sudden changes of position?   9. PSYCHOMOTOR  RETARDATION [x] No (0)    [] Yes, mild (1)  [] Yes, marked (2)  At any time during the interview, did the patient have an unusually decreased level of motor activity, such as sluggishness, staring into space, staying in one position for a long time, or moving very slowly?   10. ALTERED  SLEEP-WAKE  CYCLE  [x] No (0)    [] Yes, mild (1)  [] Yes, marked (2)  Did the patient have evidence of disturbance of the sleep-wake cycle, such as excessive daytime sleepiness with insomnia at   night?   Long Form   SEVERITY SCORE:  Severity Score (add rows 1-10)  Total (0-19):     Scoring the CAM-S: Rate each symptom of delirium listed in the CAM instrument as absent (0), mild (1), marked (2). Acute onset or fluctuation is rated as absent (0) or present (1). Summarize these scores into a composite.    Problem: Adult Inpatient Plan of Care  Goal: Absence of Hospital-Acquired Illness or Injury  Outcome: Ongoing - Unchanged  Intervention: Identify and Manage Fall Risk  Recent Flowsheet Documentation  Taken 08/23/2023 1400 by Rutha Bouchard, RN  Safety Interventions:   fall reduction program maintained   low bed   nonskid shoes/slippers when out of bed  Intervention: Prevent Skin Injury  Recent Flowsheet Documentation  Taken 08/23/2023 1400 by Rutha Bouchard, RN  Positioning for Skin: Sitting in Chair  Goal: Optimal Comfort and Wellbeing  Outcome: Ongoing - Unchanged     Problem: Fall Injury Risk  Goal: Absence of Fall and Fall-Related Injury  Outcome: Ongoing - Unchanged  Intervention: Promote Injury-Free Environment  Recent Flowsheet Documentation  Taken 08/23/2023 1400 by Rutha Bouchard, RN  Safety Interventions:   fall reduction program maintained   low bed   nonskid shoes/slippers when out of bed     Problem: Self-Care Deficit  Goal: Improved Ability to Complete Activities of Daily Living  Outcome: Ongoing - Unchanged     Problem: Heart Failure  Goal: Improved Oral Intake  Outcome: Ongoing - Unchanged  Goal: Effective Breathing Pattern During Sleep  Outcome: Ongoing - Unchanged     Problem: Heart Failure  Goal: Improved Oral Intake  Outcome: Ongoing - Unchanged  Goal: Effective Breathing Pattern During Sleep  Outcome: Ongoing - Unchanged     Problem: Infection  Goal: Absence of Infection Signs and Symptoms  Outcome: Ongoing - Unchanged     Problem: Infection  Goal: Absence of Infection Signs and Symptoms  Outcome: Ongoing - Unchanged

## 2023-08-23 NOTE — Unmapped (Signed)
[facility] Cardiology Consult Note    Requesting Attending Physician :  Tawnya Crook, MD  Service Requesting Consult : Geriatrics (MDA)  Date of Service: August 23, 2023   Code Status: Full Code    Assessment and Plan:     Barbara Huber is a 82 y.o. female with a past medical history of [name] (diagnosed in 2021), pulmonary hypertension, persistent atrial fibrillation, anemia, [name] stage IV, type 2 diabetes, COPD (wears 3 L O2 at home), gout, TTR amyloid ((diagnosed in 2021, hATTR)) who presented to the Emergency Department on 08/18/2023 with complaints of increased shortness of breath over 2 days, found to be in an acute heart failure exacerbation.     Weights  06/17/2023 95.3 kg = 210 pounds  07/14/2023 95.5 kg = 210 pounds 8 ounces  08/19/2023 95.3 kg = 210 pounds  08/20/2023 94 kg = 207 pounds 4.8 ounces  08/21/2023 94.2 kg 207 pounds 9.6 ounces  08/22/2023 93.9 kg = 207 pounds 1.6 ounces (which appears to be the patient's new dry weight)    Ins and outs  08/18/2023 -187   08/19/2023 +360  08/20/2023 +440  08/21/2023 +220  08/23/2023 +300    1.  [name] exacerbation - TTR Cardiac Amyloidosis  Patient presented on 08/18/2023 reporting that she had recently finished a 6-day steroid taper for a gout flare in her left hand.  The patient is known to Cardiology and follows with Derwood Kaplan and [facility].  The patient's previous dry weight was between 210-212 pounds; patient new dry weight is 207 pounds. The patient has previously had issues with hypotension and atrial arrhythmias with beta-blockers and ARBs, and she has had recurrent UTIs with Jardiance.  At her last appointment in July she was not interested in [facility].  Patient ferritin on 08/19/2023 was 336.3.  We believe that the steroids prescribed for the patient's acute gout flare is what brought on her heart failure exacerbation.  Given the patient's improvement on physical exam in terms of her volume status, we do not believe a right heart catheterization is necessary at this time, and the patient voiced agreement with this plan.   1.  Continue 60 mg of torsemide daily  2.  Message sent to set patient up for a follow-up appointment with Derwood Kaplan in diuresis clinic within the next 7 days.   3.  If the patient is not discharging today she should be on telemetry.  4.  Continue spironolactone 25 mg daily.  5.  Continue patient home to Tafamidis 61 mg daily.    2.  History of persistent atrial fibrillation  The patient is not currently in atrial fibrillation but does have a history and required a cardioversion in January 2022.  CHA2DS2-VASc score = 6 however the patient has not been started on Eliquis due to concern for GI bleeding.   1.  Continue 200 mg amiodarone daily.    Cardiology will sign off at this time as the patient is discharging.  Thank you for involving [place] in this patient's care. Please do not hesitate to reach out with any further questions.    HPI:     Reason for Consult: Consideration of a right heart catheterization prior to discharge.    Barbara Huber is a 82 y.o. female with a PMH of [name] (diagnosed in 2021), pulmonary hypertension, [name] stage IV, type 2 diabetes, COPD (wears 3 L O2 at home), gout, TTR amyloid ((diagnosed in 2021, hATTR)) who presented to the Emergency Department on 08/18/2023  with complaints of increased shortness of breath over 2 days, found to be in an acute heart failure exacerbation.     On exam today, patient states that she is feeling well and denies: Chest pain, dizziness/lightheadedness, shortness of breath, and heart palpitations.  The patient notes that prior to receiving treatment for her gout flare she had been doing a good job managing her heart failure with the help of diuresis clinic and had not required hospitalization for heart failure exacerbations.  Patient's daughter notes that she was able to tell that something was off with her mom based on: Lack of conversation, twitching, and lack of appetite.  The patient originally had an appointment to see Derwood Kaplan on 08/20/2023 the patient's daughter reports she did not believe the patient could wait that long.    Review of Systems: 12 point review negative except for stated above    Allergies:   Allergies   Allergen Reactions    Nitrofurantoin Anaphylaxis and Other (See Comments)    Lipitor [Atorvastatin] Muscle Pain     SOB, Headache, fatigue, sick on my stomach       Medications:   Scheduled Meds:   amiodarone  200 mg Oral Daily    ergocalciferol-1,250 mcg (50,000 unit)  1,250 mcg Oral Weekly    estradiol  2 g Vaginal Once per day on Monday Thursday    fluticasone furoate-vilanterol  1 puff Inhalation Daily (RT)    gabapentin  300 mg Oral Daily    heparin (porcine) for subcutaneous use  7,500 Units Subcutaneous Q12H SCH    lidocaine  1 patch Transdermal Daily    montelukast  10 mg Oral Daily    oxyCODONE  10 mg Oral Q12H SCH    pantoprazole  20 mg Oral Daily    polyethylene glycol  17 g Oral Daily    senna  2 tablet Oral BID    spironolactone  25 mg Oral Daily    tafamidis  61 mg Oral Daily    torsemide  60 mg Oral Daily    umeclidinium  1 puff Inhalation Daily (RT)    vitamin A-3,000 mcg RAE (10,000 UNIT)  3,000 mcg of RAE Oral Daily       PRN Meds:.acetaminophen, albuterol    Medical History:  Past Medical History:   Diagnosis Date    Acute kidney injury superimposed on chronic kidney disease (CMS-HCC) 10/11/2020    Acute on chronic diastolic (congestive) heart failure (CMS-HCC) 08/23/2020    Acute on chronic diastolic congestive heart failure (CMS-HCC)     AKI (acute kidney injury) (CMS-HCC) 04/14/2015    Lab Results  Component  Value  Date     CREATININE  1.90 (H)  06/12/2021     Had a bump in her creatinine when she was taking her diuretics every day.  She is currently taking 40 mg daily of torsemide and 50 mg of spironolactone.  Her volume status is fragile.  Previously when she stopped her diuretic she becomes short of breath.  Plan: We will check her BMP today.  We will likely have to go to 40    Anemia 08/22/2019    Iron deficiency anemia          Lab Results      Component    Value    Date           WBC    9.2    03/21/2021           RBC  3.67 (L)    03/21/2021           HGB    9.9 (L)    03/21/2021           HCT    30.7 (L)    03/21/2021           MCV    83.7    03/21/2021           MCH    26.9    03/21/2021           MCHC    32.1    03/21/2021           RDW    21.9 (H)    03/21/2021           PLT    318     Arthritis     Calculus of kidney     Calculus of ureter     CHF (congestive heart failure) (CMS-HCC)     Chronic atrial fibrillation (CMS-HCC) 07/20/2019    COPD (chronic obstructive pulmonary disease) (CMS-HCC)     Diabetes (CMS-HCC)     Gangrenous cholecystitis 10/11/2020    Generalized edema  06/17/2021    GERD (gastroesophageal reflux disease)     Hydronephrosis     Hypertension     Hyponatremia 10/11/2020    Intermediate coronary syndrome (CMS-HCC) 03/13/2014    Lower extremity edema 09/28/2020    Lumbar stenosis     Microscopic hematuria     Nausea alone     Nephrolithiasis 04/17/2016    Neuropathy     Nocturia     Nocturia 07/01/2017    Other chronic cystitis     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  See plans for anemia, AKI  We are also tapering her gabapentin.  Currently on 300 mg 3 times daily.  We will decrease it to twice daily, and then nightly.  She states she is not having any recurrence of her pain.    Pulmonary hypertension (CMS-HCC)     Renal colic     Shortness of breath 04/28/2020    Sleep apnea     Unstable angina pectoris (CMS-HCC) 03/13/2014       Social History:  Social History     Tobacco Use   Smoking Status Former    Current packs/day: 0.00    Types: Cigarettes    Quit date: 2019    Years since quitting: 5.6   Smokeless Tobacco Never   Tobacco Comments    Quit a few years ago     Social History     Substance and Sexual Activity   Alcohol Use No     Social History     Substance and Sexual Activity   Drug Use No       Family History:  Family History   Problem Relation Age of Onset    Hypertension Mother     Cancer Father         COLON CANCER    No Known Problems Sister     No Known Problems Sister     No Known Problems Brother     No Known Problems Brother     No Known Problems Maternal Aunt     No Known Problems Maternal Uncle     No Known Problems Paternal Aunt     No Known Problems Paternal Uncle     No Known Problems Maternal Grandmother  No Known Problems Maternal Grandfather     No Known Problems Paternal Grandmother     No Known Problems Paternal Grandfather     No Known Problems Other     Anesthesia problems Neg Hx     Broken bones Neg Hx     Clotting disorder Neg Hx     Collagen disease Neg Hx     Diabetes Neg Hx     Dislocations Neg Hx     Fibromyalgia Neg Hx     Gout Neg Hx     Hemophilia Neg Hx     Osteoporosis Neg Hx     Rheumatologic disease Neg Hx     Scoliosis Neg Hx     Severe sprains Neg Hx     Sickle cell anemia Neg Hx     Spinal Compression Fracture Neg Hx     GU problems Neg Hx     Kidney cancer Neg Hx     Prostate cancer Neg Hx        Objective:     Physical Exam:  VITAL SIGNS: Temp:  [36.5 ??C (97.7 ??F)-37.2 ??C (99 ??F)] 36.5 ??C (97.7 ??F)  Heart Rate:  [69-73] 73  Resp:  [17-18] 17  BP: (117-155)/(57-72) 117/57  SpO2:  [91 %-96 %] 93 %  General:  Alert, cooperative, no distress   HEENT: Atraumatic, PERRL, EOM intact, moist mucous membranes   Neck: No JVD.   Heart: Regular rate and rhythm. normal S1/S2, without murmur, no rub or gallop.   Lungs:   Normal respiratory effort, clear to auscultation bilaterally, without wheezes, rales or rhonchi.    Abdomen:   Soft & non-tender, active bowel sounds   Extremities: Trace lower extremity edema, no cyanosis. Decreased at 1-2+ and symmetric pedal pulses.   Skin: Normal skin color, texture and turgor, no rashes or lesions.   Neurologic: Alert, oriented to person, place, and time     Test Results  ECG 08/18/2023 (personally reviewed): Accelerated junctional rhythm with left axis deviation. When compared to prior 07/14/2023, the patient is no longer normal sinus rhythm with left posterior hemiblock..        EKG 07/14/2023      Echocardiogram 08/19/2023  Summary    1. The left ventricle is normal in size with moderately increased wall thickness.    2. 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    3. The left ventricular systolic function is hyperdynamic, LVEF is visually estimated at >70%.    4. Mitral annular calcification is present (moderate).    5. The mitral valve leaflets are mildly thickened with normal leaflet  mobility.    6. The aortic valve is trileaflet with mildly thickened leaflets with normal excursion.    7. The left atrium is moderately dilated in size.    8. The right ventricle is normal in size, with normal systolic function.     Left Ventricle    The left ventricle is normal in size with moderately increased wall  thickness. 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 The left ventricular systolic function is hyperdynamic, LVEF  is visually estimated at >70%. Left ventricular diastolic function cannot be  accurately assessed.     Right Ventricle    The right ventricle is normal in size, with normal systolic function.     Left Atrium    The left atrium is moderately dilated in size.     Right Atrium  The right atrium is normal in size.        Aortic Valve    The aortic valve is trileaflet with mildly thickened leaflets with normal excursion. There is no significant aortic regurgitation. There is no evidence of a significant transvalvular gradient.     Mitral Valve    Mitral annular calcification is present (moderate). The mitral valve  leaflets are mildly thickened with normal leaflet mobility. There is trivial mitral valve regurgitation.     Tricuspid Valve    The tricuspid valve leaflets are normal, with normal leaflet mobility. There is trivial tricuspid regurgitation. The pulmonary systolic pressure cannot be estimated due to insufficient TR signal.     Pericardium/Pleural    There is no pericardial effusion.        Ventricles  ----------------------------------------------------------------------  Name                                 Value        Normal  ----------------------------------------------------------------------     LV Dimensions 2D/MM  ----------------------------------------------------------------------   IVS Diastolic Thickness  (2D)                                1.4 cm       0.6-0.9   LVID Diastole (2D)                  4.2 cm       3.8-5.2    LVPW Diastolic Thickness  (2D)                                1.1 cm       0.6-0.9   LVID Systole (2D)                   2.6 cm       2.2-3.5   LV Mass Index (2D Cubed)           88 g/m2         43-95    Relative Wall Thickness  (2D)                                  0.52                    RV Dimensions 2D/MM  ----------------------------------------------------------------------   RV Basal Diastolic  Dimension                           4.3 cm       2.5-4.1   TAPSE                               1.8 cm         >=1.7     Atria  ----------------------------------------------------------------------  Name                                 Value        Normal  ----------------------------------------------------------------------     LA Dimensions  ----------------------------------------------------------------------  LA Volume Index (4C A-L)  34.87 ml/m2                 LA Volume Index (2C A-L)        16.12 ml/m2                 LA Volume (BP MOD)                   53 ml                 LA Volume Index (BP MOD)        24.63 ml/m2   16.00-34.00      RA Dimensions  ----------------------------------------------------------------------  RA Area (4C)                      17.5 cm2        <=18.0   RA Area (4C) Index              8.1 cm2/m2                 RA ESV Index (4C MOD)             23 ml/m2 15-27     Aortic Valve  ----------------------------------------------------------------------  Name                                 Value        Normal  ----------------------------------------------------------------------     AV Doppler  ----------------------------------------------------------------------  AV Peak Velocity                   1.4 m/s                 AV Peak Gradient                    8 mmHg     Mitral Valve  ----------------------------------------------------------------------  Name                                 Value        Normal  ----------------------------------------------------------------------     MV Diastolic Function  ----------------------------------------------------------------------  MV E Peak Velocity                 67 cm/s                 MV A Peak Velocity                 71 cm/s                 MV E/A                                 0.9                    MV Annular TDI  ----------------------------------------------------------------------  MV Septal e' Velocity             3.9 cm/s         >=8.0   MV E/e' (Septal)                      17.3  MV Lateral e' Velocity            4.5 cm/s        >=10.0   MV E/e' (Lateral)                     15.1                 MV e' Average                     4.2 cm/s                 MV E/e' (Average)                     16.2     Tricuspid Valve  ----------------------------------------------------------------------  Name                                 Value        Normal  ----------------------------------------------------------------------     TV Regurgitation Doppler  ----------------------------------------------------------------------  TR Peak Velocity                   3.0 m/s     Venous  ----------------------------------------------------------------------  Name                                 Value        Normal  ---------------------------------------------------------------------- IVC/SVC  ----------------------------------------------------------------------  IVC Diameter (Insp 2D)              0.7 cm                 IVC Diameter (Exp 2D)               2.6 cm         <=2.1    IVC Diameter Percent Change  (2D)                                  73 %          >=50       Coronary angiogram 01/09/2021  Findings:  1. Elevated left and right sided filling pressures  2. Prominent Y-descent in right atrium  3. Preserved cardiac output      Labs:   Lab Results   Component Value Date    TROPONINI 152 (HH) 08/19/2023    TROPONINI 164 (HH) 08/19/2023    TROPONINI 150 (HH) 08/18/2023     Lab Results   Component Value Date    WBC 10.1 08/23/2023    HGB 12.1 08/23/2023    HCT 38.0 08/23/2023    PLT 186 08/23/2023       Lab Results   Component Value Date    NA 137 08/23/2023    K 4.2 08/23/2023    CL 99 08/23/2023    CO2 28.3 08/23/2023    BUN 91 (H) 08/23/2023    CREATININE 3.06 (H) 08/23/2023    GLU 95 08/23/2023    CALCIUM 9.7 08/23/2023    MG 2.4 08/21/2023    PHOS 5.1 08/20/2023       Lab Results   Component Value Date    BILITOT 0.6 08/21/2023    BILIDIR 0.30 08/21/2023    PROT 7.2  08/21/2023    ALBUMIN 3.2 (L) 08/21/2023    ALT 10 08/21/2023    AST 23 08/21/2023    ALKPHOS 154 (H) 08/21/2023    GGT 215 (H) 09/29/2020       Lab Results   Component Value Date    PT 25.0 (H) 07/01/2021    INR 2.15 07/01/2021    APTT 46.9 (H) 01/10/2021       Electronically signed by:  Leia Alf. Adiva Boettner, FNP-C  [facility] Card[facility]

## 2023-08-23 NOTE — Unmapped (Signed)
Pt in with CHF.  VSS, receiving po diuretic, tolerating without difficulty. Standby assistance needed with adls.  Skin and falls protocol maintained. Will continue to work towards prioritized goals.    Problem: Adult Inpatient Plan of Care  Goal: Plan of Care Review  Outcome: Progressing     Problem: Fall Injury Risk  Goal: Absence of Fall and Fall-Related Injury  Outcome: Progressing  Intervention: Promote Scientist, clinical (histocompatibility and immunogenetics) Documentation  Taken 08/23/2023 0800 by Bartholomew Boards, RN  Safety Interventions:   fall reduction program maintained   low bed   nonskid shoes/slippers when out of bed     Problem: Self-Care Deficit  Goal: Improved Ability to Complete Activities of Daily Living  Outcome: Progressing     Problem: Heart Failure  Goal: Optimal Coping  Outcome: Progressing   6CIT Score for Cognitive Screening:  Complete for all new admissions, scoring found on the back of the Kardex  If a patient has known dementia, no need to complete the 6CIT  [] Patient has dementia  [] Normal 6CIT (<8)    [] Significant 6CIT (8 or higher)  [] Needs to be completed    CAM Delirium Screening Tool  Be sure to ask questions to gauge orientation and attention during your assessment  Feature Present? Scoring: CAM Positive if:   Acute onset (different from baseline) with Fluctuating course [] Yes    [x] No   Yes to BOTH of these AND.Marland KitchenMarland Kitchen   Inattention (days of the week or months of the year backward) [] Yes    [x] No    Disorganized thinking [] Yes    [x] No   Yes to at least   ONE of these   Altered level of consciousness  [] Yes    [x] No    CAM []  Positive  [x] Negative    CAM-S: Symptom Severity Score  Complete on all patients ?82 years old, including in patients with dementia  Do not complete if the patient is known to be non-verbal due to their dementia  Feature  Severity Score  Question   ACUTE ONSET  & FLUCTUATING  COURSE [x] No (0)    [] Yes (1)    Is there evidence of an acute change in mental status from the patient's baseline? Did the patient's behavior fluctuate at any point during the interview for any of the 10 features?   2. INATTENTION [x] No (0)    [] Yes, mild (1)  [] Yes, marked (2)  Did the patient have difficulty focusing attention, for example being easily distractible, or having difficulty keeping track of what was being said?   3. DISORGANIZED  THINKING [x] No (0)    [] Yes, mild (1)  [] Yes, marked (2)  Was the patient's thinking disorganized or incoherent, such as rambling or irrelevant conversation, unclear or illogical flow or of ideas, unpredictable switching from subject to subject?   4. ALTERED LEVEL  OF  CONSCIOUSNESS [x] Normal (0)    [] Mild: vigilant or lethargic (1)  [] Marked: stupor or coma (2)    Overall, how would you rate the patient's level of consciousness?   -Alert (normal)   -Vigilant  -Lethargic  -Stupor  -Coma  -Uncertain   5. DISORIENTATION [x] No (0)    [] Yes, mild (1)  [] Yes, marked (2)  Was the patient disoriented at any time during the interview, such as thinking he/she was somewhere other than the hospital, using the wrong bed, or misjudging the time of day?   6. MEMORY  IMPAIRMENT [x] No (0)    [] Yes, mild (1)  [] Yes, marked (2)  Did the patient demonstrate any memory problems during the interview, such as inability to remember events in the hospital or difficulty remembering instructions?   7. PERCEPTUAL  DISTURBANCES [x] No (0)    [] Yes, mild (1)  [] Yes, marked (2)  Did the patient have any evidence of perceptual disturbances, for example, hallucinations, illusions, or misinterpretations (such as thinking something was moving when it was not)?   8. PSYCHOMOTOR  AGITATION [x] No (0)    [] Yes, mild (1)  [] Yes, marked (2)  At any time during the interview, did the patient have an unusually increased level of motor activity, such as restlessness, picking at bedclothes, tapping fingers, or making frequent sudden changes of position?   9. PSYCHOMOTOR  RETARDATION [x] No (0)    [] Yes, mild (1)  [] Yes, marked (2)  At any time during the interview, did the patient have an unusually decreased level of motor activity, such as sluggishness, staring into space, staying in one position for a long time, or moving very slowly?   10. ALTERED  SLEEP-WAKE  CYCLE  [x] No (0)    [] Yes, mild (1)  [] Yes, marked (2)  Did the patient have evidence of disturbance of the sleep-wake cycle, such as excessive daytime sleepiness with insomnia at   night?   Long Form   SEVERITY SCORE:  Severity Score (add rows 1-10)  Total (0-19): 0    Scoring the CAM-S: Rate each symptom of delirium listed in the CAM instrument as absent (0), mild (1), marked (2). Acute onset or fluctuation is rated as absent (0) or present (1). Summarize these scores into a composite.

## 2023-08-23 NOTE — Unmapped (Signed)
Physician Discharge Summary HBR  4 BT1 HBR  [address]  [place] [address]  Dept: [phone]  Loc: [phone]     Identifying Information:   Barbara Huber  1941/04/12  [id number]    Primary Care Physician: Artelia Laroche, MD     Code Status: Full Code    Admit Date: 08/18/2023    Discharge Date: 08/23/2023     Discharge To: Home with Home Health and/or PT/OT    Discharge Service: [facility]BR - Geriatrics Floor Team (MED A - Tower)     Discharge Attending Physician: Tawnya Crook, MD    Discharge Diagnoses:   Principal Problem (Resolved):    Acute exacerbation of CHF (congestive heart failure) (CMS-HCC) (POA: Unknown)  Active Problems:    Spinal stenosis of lumbar region (POA: Yes)    COPD (chronic obstructive pulmonary disease) (CMS-HCC) (POA: Yes)    Sleep apnea in adult (POA: Yes)    Essential hypertension (POA: Yes)    Type 2 diabetes mellitus with stage 4 chronic kidney disease, with long-term current use of insulin (CMS-HCC) (POA: Not Applicable)    Peripheral neuropathy (POA: Yes)    Chronic respiratory failure with hypoxia (CMS-HCC) (POA: Yes)    Persistent atrial fibrillation (CMS-HCC) (POA: Yes)    Chronic kidney disease ([name]), stage IV (severe) (CMS-HCC) (POA: Yes)    Pulmonary hypertension (CMS-HCC) (POA: Yes)    Dyspnea on exertion (POA: Yes)    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) (POA: Yes)    Familial amyloid heart disease (CMS-HCC) (POA: Yes)      Hospital Course:   Hospital Course  Barbara Huber is a 82 y.o. female whose presentation is complicated by [name], cardiac amyloidosis, pulmonary hypertension, chronic respiratory failure on 3 L, COPD, [name] stage IV, atrial fibrillation, DM II that presented to [facility] with shortness of breath found to be in an acute heart failure exacerbation. Transferred from [facility] back to [facility] with inadequate diuresis and continued shortness of breath. She improved with ongoing diuresis.    Acute on Chronic [name] Exacerbation - Grade II DD - Cardiac Amyloidosis - Type II Pulm HTN  Patient follows with cardiology outpatient in the cardiac amyloid clinic and has been seen in pulm HTN clinic as well. Previous diuretic regimen of 40 mg every morning of torsemide, but 20 mg torsemide in PM.  Last echocardiogram from 02/17/2023 with LVEF of greater than 70%, mildly increased wall of LV, dilated LA, mild pulmonary hypertension. On presentation patient more SOB, and found to have BNP of 342.57 (up from 118.62 from 07/14/23), some abdominal distension, elevated JVD and hepatojugular reflux with chest x-ray showing pulmonary edema with possible small bilateral effusions, consistent with heart failure exacerbation. Echocardiogram 08/19/23 with LVEF of 70%, moderate mitral annular calcification, moderately dilated LA, normal RV function. HF exacerbation thought to be in setting of decreased diuretic dosing for the past few days as well as some water retention with prednisone usage. Undewent diuresis (IV lasix 80, 120, IV bumex 3 mg, metolazone 5 mg) with UOP but limited. Patient's weight seemed to have downtrended (8/22 210 lbs --> 207 lbs) with unclear dry weight, and patient's lung exam improved from prior now without rales and she returned to her home oxygen requirements. Cardiology was consulted and recommended continuing torsemide 60 mg and spironolactone 25 mg daily and will set up follow up for patient in diuresis clinic. Recommend renal dosing of colchicine for acute gout flares.  Recommended that cardiology be consulted prior  to initiating the patient on steroids of any kind given her history of [name] and cardiac amyloidosis.    Chronic Respiratory Failure - COPD - Pulmonary HTN   Patient with history of chronic respiratory failure on home 3 to 4 L daily.  Denies any cough, and no wheezing on presentation to suggest COPD as etiology to shortness of breath, and had finished course of steroids for gout close to admission which should help if due to COPD. Supposed to be repeating spirometry next spring 2025. Endorsed some benefit from CPAP 8/24.    Acute Kidney Injury on [name] Stage IV, Improved  Follows with nephrology outpatient. [name] thought to be due to diabetes and hypertension. Thought not to have amyloid infiltration of kidneys given the type of amyloid.  Baseline seems to be around 2.5-3 but with significant fluctuation. Presented with Cr of 3.12. Cr peaked at 4 on 8/23 and has been downtrending. Believed to be cardiorenal that improved with diuresis.    Troponin Elevation - Likely Type II MI   Patient found to have a initial high sensitive troponin of 103. Troponin peaked on 8/22 to 164 and began to downtrend.  Patient denied any chest pain.  EKG without frank signs of ischemia.  Elevation likely in the setting of heart failure exacerbation.     Hx of Persistent Atrial Fibrillation   CHA2DS2-VASc score of 5, however after anemia and concern for bleeding patient was not started on Eliquis.  Had a cardioversion back in 01/10/2021.  She remained in NSR.     Hx of Iron Deficiency  Has had iron infusions and been placed on oral supplementation in the past. Repeat iron panel this admission still showing evidence of some iron deficiency. Continue oral iron supplementation on discharge.     Chronic Problems  Chronic Pain: Followed by the outpatient pain clinic [facility] in [place]. Takes OxyContin 12 mg 2 times a day, can continue current regimen inpatient with bowel regimen ordered.  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 In the setting of acute injury on top of [name], will manage with SSI for now. Discontinued SSI given average 140-140 and no SSI since start 8/21  Gout: Recent flare of left elbow and wrist. Treated with prednisone about two weeks ago. Not currently in flare at the moment. Can consider allopurinol initiation (limited by eGFR).  The patient's hospital stay has been complicated by the following clinically significant conditions requiring additional evaluation and treatment or having a significant effect of this patient's care: - Chronic kidney disease POA requiring further investigation, treatment, or monitoring  - Age related debility POA requiring additional resources: DME, PT, or OT  - Hyponatremia POA requiring further investigation, treatment, or monitoring     Outpatient Provider Follow Up Issues:   [ ]  Hospital follow up with PCP  [ ]  Follow up with PCP on restarting home Lantus. Insulin was held in hospital due to controlled blood sugars.   [ ]  Follow up in diuresis clinic for medication management    Touchbase with Outpatient Provider:  Warm Handoff: Completed on 08/23/23 by Rollen Sox, MD  (Intern) via [facility] Message    Procedures:  None  ______________________________________________________________________  Discharge Medications:      Your Medication List        STOP taking these medications  ACCU-CHEK GUIDE TEST STRIPS Strp  Generic drug: blood sugar diagnostic     blood-glucose meter kit     insulin glargine 100 unit/mL (3 mL) injection pen  Commonly known as: BASAGLAR, LANTUS     lancets Misc     pen needle, diabetic 31 gauge x 5/16 (8 mm) Ndle  Commonly known as: BD ULTRA-FINE SHORT PEN NEEDLE            CONTINUE taking these medications      acetaminophen 500 MG tablet  Commonly known as: TYLENOL  Take 2 tablets (1,000 mg total) by mouth Three (3) times a day.     albuterol 90 mcg/actuation inhaler  Commonly known as: PROVENTIL HFA;VENTOLIN HFA  Inhale 2 puffs every eight (8) hours as needed for wheezing.     amiodarone 200 MG tablet  Commonly known as: PACERONE  TAKE ONE TABLET BY MOUTH ONCE DAILY     AMVUTTRA 25 mg/0.5 mL injection  Generic drug: vutrisiran  Inject 0.5 mL (25 mg total) under the skin once. Every 12 Weeks     cranberry 500 mg Cap  Take 500 mg by mouth daily with evening meal.     ergocalciferol-1,250 mcg (50,000 unit) 1,250 mcg (50,000 unit) capsule  Commonly known as: DRISDOL  Take 1 capsule (1,250 mcg total) by mouth once a week.     estradiol 0.01 % (0.1 mg/gram) vaginal cream  Commonly known as: ESTRACE  Insert 2 g into the vagina Two (2) times a week.     ferrous sulfate 325 (65 FE) MG EC tablet  Take 1 tablet (325 mg total) by mouth Every Monday, Wednesday, and Friday.     fluticasone propion-salmeterol 115-21 mcg/actuation inhaler  Commonly known as: ADVAIR HFA  Inhale 2 puffs two (2) times a day.     gabapentin 300 MG capsule  Commonly known as: NEURONTIN  Take 1 capsule (300 mg total) by mouth two (2) times a day.     metOLazone 5 MG tablet  Commonly known as: ZAROXOLYN  Take 1 tablet (5 mg total) by mouth daily as needed (when instructed by cardiology clinic).     montelukast 10 mg tablet  Commonly known as: SINGULAIR  Take 1 tablet (10 mg total) by mouth every morning.     NARCAN 4 mg/actuation nasal spray  Generic drug: naloxone  1 spray into alternating nostrils once as needed (opioid overdose). PRN - Emergency use.     NON FORMULARY  APPLY FROM NECK DOWN TWICE A DAY     OxyCONTIN 10 mg Tr12 12 hr crush resistant ER/CR tablet  Generic drug: oxyCODONE  Take 1 tablet (10 mg total) by mouth every twelve (12) hours.     OXYGEN-AIR DELIVERY SYSTEMS MISC  5 L by Miscellaneous route. Currently using 3  L/min via [place]     pantoprazole 20 MG tablet  Commonly known as: Protonix  TAKE 1 TABLET(20 MG) BY MOUTH DAILY     rosuvastatin 5 MG tablet  Commonly known as: CRESTOR  Take 1 tablet (5 mg total) by mouth every other day.     SPIRIVA RESPIMAT 2.5 mcg/actuation inhalation mist  Generic drug: tiotropium bromide  Inhale 2 puffs daily.     spironolactone 25 MG tablet  Commonly known as: ALDACTONE  TAKE 1 TABLET(25 MG) BY MOUTH DAILY     torsemide 20 MG tablet  Commonly known as: DEMADEX  Take 3 tablets (60 mg total) by mouth daily.     vitamin A-3,000  mcg RAE (10,000 UNIT) 3,000 mcg RAE (10,000 UNIT) capsule  Take 1 capsule (3,000 mcg of RAE total) by mouth daily.     VYNDAMAX 61 mg Cap  Generic drug: tafamidis  Take 1 capsule (61 mg) by mouth daily.              Allergies:  Nitrofurantoin and Lipitor [atorvastatin]  ______________________________________________________________________  Pending Test Results:      Most Recent Labs:  Microbiology -   Microbiology Results (last day)       ** No results found for the last 24 hours. **          CBC - Results in Past 30 Days  Result Component Current Result Ref Range Previous Result Ref Range   HCT 38.0 (08/23/2023) 34.0 - 44.0 % 36.5 (08/22/2023) 34.0 - 44.0 %   HGB 12.1 (08/23/2023) 11.3 - 14.9 g/dL 16.1 (0/96/0454) 09.8 - 14.9 g/dL   MCH 11.9 (1/47/8295) 25.9 - 32.4 pg 30.0 (08/22/2023) 25.9 - 32.4 pg   MCHC 31.8 (L) (08/23/2023) 32.0 - 36.0 g/dL 62.1 (03/04/6577) 46.9 - 36.0 g/dL   MCV 62.9 (05/25/4131) 77.6 - 95.7 fL 92.2 (08/22/2023) 77.6 - 95.7 fL   MPV 9.0 (08/23/2023) 6.8 - 10.7 fL 9.1 (08/22/2023) 6.8 - 10.7 fL   Platelet 186 (08/23/2023) 150 - 450 10*9/L 158 (08/22/2023) 150 - 450 10*9/L   RBC 4.11 (08/23/2023) 3.95 - 5.13 10*12/L 3.95 (08/22/2023) 3.95 - 5.13 10*12/L   WBC 10.1 (08/23/2023) 3.6 - 11.2 10*9/L 9.6 (08/22/2023) 3.6 - 11.2 10*9/L     BMP - Results in Past 30 Days  Result Component Current Result Ref Range Previous Result Ref Range   BUN 91 (H) (08/23/2023) 9 - 23 mg/dL 88 (H) (4/40/1027) 9 - 23 mg/dL   Chloride 99 (2/53/6644) 98 - 107 mmol/L 99 (08/22/2023) 98 - 107 mmol/L   CO2 28.3 (08/23/2023) 20.0 - 31.0 mmol/L 26.1 (08/22/2023) 20.0 - 31.0 mmol/L   Creatinine 3.06 (H) (08/23/2023) 0.55 - 1.02 mg/dL 0.34 (H) (7/42/5956) 3.87 - 1.02 mg/dL   Glucose 95 (5/64/3329) 70 - 179 mg/dL 518 (8/41/6606) 70 - 301 mg/dL   Potassium 4.2 (05/29/931) 3.4 - 4.8 mmol/L 4.2 (08/22/2023) 3.4 - 4.8 mmol/L   Sodium 137 (08/23/2023) 135 - 145 mmol/L 134 (L) (08/22/2023) 135 - 145 mmol/L Coagulation -   No results found for requested labs within last 30 days.     Cardiac markers -   No results found for requested labs within last 30 days.     ABGs-   No results found for requested labs within last 30 days.     LFT's - Results in Past 30 Days  Result Component Current Result Ref Range Previous Result Ref Range   Albumin 3.2 (L) (08/21/2023) 3.4 - 5.0 g/dL 3.7 (3/55/7322) 3.4 - 5.0 g/dL   Alkaline Phosphatase 154 (H) (08/21/2023) 46 - 116 U/L 181 (H) (08/18/2023) 46 - 116 U/L   ALT 10 (08/21/2023) 10 - 49 U/L 11 (08/18/2023) 10 - 49 U/L   AST 23 (08/21/2023) <=34 U/L 26 (08/18/2023) <=34 U/L   Bilirubin, Direct 0.30 (08/21/2023) 0.00 - 0.30 mg/dL Not in Time Range    Total Bilirubin 0.6 (08/21/2023) 0.3 - 1.2 mg/dL 0.5 (0/25/4270) 0.3 - 1.2 mg/dL     Toxicology screen -   No results found for requested labs within last 30 days.   [      Relevant Studies/Radiology:  XR Abdomen 1 View    Result Date: 08/21/2023  EXAM: XR ABDOMEN 1 VIEW ACCESSION: [id number] UN CLINICAL INDICATION: 82 years old with CONSTIPATION  COMPARISON: 10/06/2020 TECHNIQUE: Supine view of the abdomen, 2 image(s) FINDINGS: Please refer to same-day chest radiograph for description of thoracic findings. Nonobstructive bowel gas pattern. Mild stool burden. Osseous degenerative changes.     Mild stool burden. Nonobstructive bowel gas pattern.     XR Chest Portable    Result Date: 08/21/2023  EXAM: XR CHEST PORTABLE ACCESSION: [id number] UN CLINICAL INDICATION: CHF  TECHNIQUE: Single View AP Chest Radiograph. COMPARISON: 08/18/2023. FINDINGS: Pulmonary vascular congestion.  No pleural effusion or pneumothorax. Stable enlarged cardiac silhouette. Tortuous thoracic aorta with calcifications. Dilated pulmonary artery which may reflect pulmonary hypertension.     Pulmonary vascular congestion and cardiomegaly.    Echocardiogram Follow Up/Limited Echo    Result Date: 08/19/2023  Patient Info Name:     Barbara Huber Age:     37 years DOB: 1941/03/25 Gender:     Female MRN:     [id number] Accession #:     [id number] UN Account #:     [id number] Ht:     168 cm Wt:     96 kg BSA:     2.15 m2 BP:     119 /     64 mmHg HR:     64 bpm Heart Rhythm:     Sinus Rhythm Exam Date:     08/19/2023 11:12 AM Admit Date:     08/18/2023 Exam Type:     ECHOCARDIOGRAM FOLLOW UP/LIMITED ECHO Technical Quality:     Fair Staff Sonographer:     Olam Idler Referring Physician:     None Per Patient Referring Reading Fellow:     Ma Hillock MD Study Info Indications      - HF exacerbation Procedure(s)   Limited 2D, color flow and Doppler transthoracic echocardiogram is performed. Summary   1. The left ventricle is normal in size with moderately increased wall thickness.   2. 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   3. The left ventricular systolic function is hyperdynamic, LVEF is visually estimated at >70%.   4. Mitral annular calcification is present (moderate).   5. The mitral valve leaflets are mildly thickened with normal leaflet mobility.   6. The aortic valve is trileaflet with mildly thickened leaflets with normal excursion.   7. The left atrium is moderately dilated in size.   8. The right ventricle is normal in size, with normal systolic function. Left Ventricle   The left ventricle is normal in size with moderately increased wall thickness. 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 The left ventricular systolic function is hyperdynamic, LVEF is visually estimated at >70%. Left ventricular diastolic function cannot be accurately assessed. Right Ventricle   The right ventricle is normal in size, with normal systolic function. Left Atrium   The left atrium is moderately dilated in size. Right Atrium   The right atrium is normal in size. Aortic Valve   The aortic valve is trileaflet with mildly thickened leaflets with normal excursion. There is no significant aortic regurgitation. There is no evidence of a significant transvalvular gradient. Mitral Valve   Mitral annular calcification is present (moderate). The mitral valve leaflets are mildly thickened with normal leaflet mobility. There is trivial mitral valve regurgitation. Tricuspid Valve   The tricuspid valve leaflets are normal, with normal leaflet mobility. There is trivial tricuspid regurgitation. The pulmonary systolic pressure cannot be estimated due to insufficient  TR signal. Pericardium/Pleural   There is no pericardial effusion. Ventricles ---------------------------------------------------------------------- Name                                 Value        Normal ---------------------------------------------------------------------- LV Dimensions 2D/MM ----------------------------------------------------------------------  IVS Diastolic Thickness (2D)                                1.4 cm       0.6-0.9 LVID Diastole (2D)                  4.2 cm       3.8-5.2  LVPW Diastolic Thickness (2D)                                1.1 cm       0.6-0.9 LVID Systole (2D)                   2.6 cm       2.2-3.5 LV Mass Index (2D Cubed)           88 g/m2         43-95  Relative Wall Thickness (2D)                                  0.52               RV Dimensions 2D/MM ----------------------------------------------------------------------  RV Basal Diastolic Dimension                           4.3 cm       2.5-4.1 TAPSE                               1.8 cm         >=1.7 Atria ---------------------------------------------------------------------- Name                                 Value        Normal ---------------------------------------------------------------------- LA Dimensions ---------------------------------------------------------------------- LA Volume Index (4C A-L)        34.87 ml/m2               LA Volume Index (2C A-L)        16.12 ml/m2               LA Volume (BP MOD)                   53 ml               LA Volume Index (BP MOD)        24.63 ml/m2   16.00-34.00 RA Dimensions ---------------------------------------------------------------------- RA Area (4C)                      17.5 cm2        <=18.0 RA Area (4C) Index              8.1 cm2/m2  RA ESV Index (4C MOD)             23 ml/m2         15-27 Aortic Valve ---------------------------------------------------------------------- Name                                 Value        Normal ---------------------------------------------------------------------- AV Doppler ---------------------------------------------------------------------- AV Peak Velocity                   1.4 m/s               AV Peak Gradient                    8 mmHg Mitral Valve ---------------------------------------------------------------------- Name                                 Value        Normal ---------------------------------------------------------------------- MV Diastolic Function ---------------------------------------------------------------------- MV E Peak Velocity                 67 cm/s               MV A Peak Velocity                 71 cm/s               MV E/A                                 0.9               MV Annular TDI ---------------------------------------------------------------------- MV Septal e' Velocity             3.9 cm/s         >=8.0 MV E/e' (Septal)                      17.3               MV Lateral e' Velocity            4.5 cm/s        >=10.0 MV E/e' (Lateral)                     15.1               MV e' Average                     4.2 cm/s               MV E/e' (Average)                     16.2 Tricuspid Valve ---------------------------------------------------------------------- Name                                 Value        Normal ---------------------------------------------------------------------- TV Regurgitation Doppler ---------------------------------------------------------------------- TR Peak Velocity                   3.0 m/s Venous ---------------------------------------------------------------------- Name  Value        Normal ---------------------------------------------------------------------- IVC/SVC ---------------------------------------------------------------------- IVC Diameter (Insp 2D)              0.7 cm               IVC Diameter (Exp 2D)               2.6 cm         <=2.1  IVC Diameter Percent Change (2D)                                  73 %          >=50 Report Signatures Finalized by Carilyn Goodpasture  MD on 08/19/2023 05:11 PM Preliminary amended by Ma Hillock  MD on 08/19/2023 02:56 PM Resident Ma Hillock  MD on 08/19/2023 02:54 PM    ECG 12 Lead    Result Date: 08/19/2023  ACCELERATED JUNCTIONAL RHYTHM LEFT AXIS DEVIATION INFERIOR INFARCT POSSIBLE ANTERIOR INFARCT ABNORMAL ECG WHEN COMPARED WITH ECG OF 18-Aug-2023 16:29, ATRIAL FIBRILLATION IS NO LONGER PRESENT Confirmed by Mariane Baumgarten ([id number]) on 08/19/2023 2:12:40 PM    ECG 12 Lead    Result Date: 08/19/2023  POOR QUALITY TRACING Confirmed by Mariane Baumgarten ([id number]) on 08/19/2023 2:10:18 PM    XR Chest 2 views    Result Date: 08/18/2023  EXAM: XR CHEST 2 VIEWS DATE: 08/18/2023 4:59 PM ACCESSION: [id number] UN DICTATED: 08/18/2023 4:59 PM INTERPRETATION LOCATION: MAIN CAMPUS CLINICAL INDICATION: 82 years old Female with DYSPNEA ; Dyspnea  TECHNIQUE: Frontal and lateral views of the chest. COMPARISON: 02/14/2023 FINDINGS: Lung: Low lung volumes. Cephalization of the pulmonary vasculature and increased interstitial markings. Patchy bibasilar airspace opacity Pleura: Blunting of the bilateral costophrenic angles. No pneumothorax. Mediastinum: Cardiac silhouette is enlarged, similar to prior. Calcifications of the thoracic aorta. Bones: There is loss of height of the lower thoracic vertebra.     Pulmonary edema with possible small effusions and bibasilar airspace opacity likely atelectasis and/or alveolar edema. ______________________________________________________________________  Discharge Instructions:   Activity Instructions       Activity as tolerated                       Follow Up instructions and Outpatient Referrals     Call MD for:  difficulty breathing, headache or visual disturbances      Call MD for:  persistent nausea or vomiting      Call MD for:  severe uncontrolled pain      Call MD for:  temperature >38.5 Celsius      Discharge instructions          Appointments which have been scheduled for you      Aug 27, 2023 9:30 AM  (Arrive by 9:15 AM)  RETURN HEART FAILURE  with Montez Hageman, AGNP  [facility] CARDIOLOGY [place] [facility] REGION) [address]  [facility] [address]  [place] [address]  [phone]   Wear comfortable walking shoes in the event that a 6-minute walk test must be performed         Sep 22, 2023 3:45 PM  (Arrive by 3:30 PM)  RETURN VIDEO MYCHART with Artelia Laroche, MD  [facility] INTERNAL MEDICINE [place] [facility] REGION) [address]  [facility] [address]  [place] [address]  [phone]   Please sign  into My [place] Chart at least 15 minutes before your appointment to complete the eCheck-In process. You must complete eCheck-In before you can start your video visit. We also recommend testing your audio and video connection to troubleshoot any issues before your visit begins. Click ???Join Video Visit??? to complete these checks. Once you have completed eCheck-In and tested your audio and video, click ???Join Call??? to connect to your visit.     For your video visit, you will need a computer with a working camera, speaker and microphone, a smartphone, or a tablet with internet access.    My [place] Chart enables you to manage your health, send non-urgent messages to your provider, view your test results, schedule and manage appointments, and request prescription refills securely and conveniently from your computer or mobile device.    You can go to [email or web address] to sign in to your My [place] Chart account with your username and password. If you have forgotten your username or password, please choose the ???Forgot Username???? and/or ???Forgot Password???? links to gain access. You also can access your My [place] Chart account with the free MyChart mobile app for Android or iPhone.    If you need assistance accessing your My [place] Chart account or for assistance in reaching your provider's office to reschedule or cancel your appointment, please call [facility] [phone].         Sep 23, 2023 3:00 PM  (Arrive by 2:45 PM)  RETURN NEPHROLOGY with Abhijit Lonia Blood, MD  [facility] [facility] REGION) [address]  [facility] 1 through 4  [place] [address]  [phone]        Oct 06, 2023 2:45 PM  (Arrive by 2:30 PM)  INFUSION ONLY with UNCTIF 10  [facility] [place]  [facility] REGION) [address]  [place] 1 through 4  [place] [address]  [phone]             ______________________________________________________________________  Discharge Day Services:  BP 117/57  - Pulse 73  - Temp 36.5 ??C (97.7 ??F) (Temporal)  - Resp 17  - Ht 170.2 cm (5' 7)  - Wt 93.9 kg (207 lb 1.6 oz)  - SpO2 93%  - BMI 32.44 kg/m??     Pt seen on the day of discharge and determined appropriate for discharge.    Condition at Discharge: fair    Length of Discharge: I spent less than 30 mins in the discharge of this patient.

## 2023-08-24 DIAGNOSIS — Z09 Encounter for follow-up examination after completed treatment for conditions other than malignant neoplasm: Principal | ICD-10-CM

## 2023-08-24 NOTE — Unmapped (Signed)
[facility] - Discharged on 08/23/2023 Admission date: 08/18/2023 - Discharge disposition: Home or Assisted Living  with Home Health      Service Provider Selected Services Address Phone Fax Patient Preferred    [facility], [facility] [address] [place], [place] [address] [phone] --

## 2023-08-26 MED FILL — VYNDAMAX 61 MG CAPSULE: ORAL | 30 days supply | Qty: 30 | Fill #8

## 2023-08-26 NOTE — Unmapped (Signed)
[facility] Note    Referring Provider: Artelia Laroche, MD  [phone] [facility] [address] [place],  [place] [address]   Primary Provider: Artelia Laroche, MD  [address]  [place] [address]   Other Providers:  Dr Barbara Huber    Reason for Visit:  Barbara Huber is a 82 y.o. female being seen for routine visit and continued care of cardiac amyloidosis .    Assessment & Plan:  1. 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 SPEP/UPEP/free light chains without concern. 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 Daughter is informed and has seen cardiology Barbara Huber).   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 2nd injection July 2024. Continue vitamin A supplementation.   - DC'd Jardiance 10mg  daily due to recurrent UTIs  - Weight below baseline today at 204 lb, target 210-212 at home.   - Reduce torsemide to 40mg  daily. OK to add back extra 20mg  if weight gets to 209lbs or above.   - Continue Tafamidis 61 mg daily   - Continue spironolactone 25 mg daily   - She is not interested in CardioMEMS at this time.     2. Persistent atrial fibrillation  - CHA2DS2-VASc score = 5 (1-gender, 2-Age, 1-DM, 1-HTN)  - We stopped Eliquis given recurrent anemia, shared decision making w/ pt and daughter. She is not interested in [place] (or any procedures) at this time  - She had been in afib since at least 07/2020; now s/p cardioversion 01/10/21 and continues to be in NSR since  - On amiodarone 200mg  daily - increased it back herself after seeing no improvement in tremors/jerking with decreased dose  - amiodarone monitoring - last PFTs in 04/2021. TSH/LFTs stable at last check     3. [name]  - Cr staying around 2.3-3.0  - following with nephrology    4. COPD  - home O2 3-4L [place] - followed by pulm  - on Advair and Spiriva    5. Iron deficiency anemia  - Finished IV Iron - ferritin 261. No longer anemic, Hgb 12.1  Lab Results   Component Value Date    FERRITIN 336.3 (H) 08/19/2023     6. Anxiety  - previously on Zoloft    Follow-up:  Return in about 6 weeks (around 10/08/2023) for Return Huber.     History of Present Illness:  [name] is a 82 y.o. female with PMHx of COPD on 3L home [place], CHF, [name], [name], [name] She was initially admitted with decompensated [name] in 2021 in setting of  Septic Shock d/t Gangrenous Cholecystitis s/p cholecystectomy. She underwent further evaluation with PYP scan which showed TTR amyloid. She has since been followed in clinic for management of her [name]. In 2022, had numerous admissions for weakness/urosepsis, shortness of breath/orthopnea/weight gain, lightheadedness/SOB/Blurry vision found to be anemic.    Summary of most recent visits:  - 01/16/22 [name]): Feeling well from a cardiac standpoint. Started Zoloft for anxiety  - 02/13/22 [name]): Weight 210-211. Feeling well. Taking extra torsemide 20mg  about  every other day, which was stopped for elevated Cr.   - 03/13/22 ([name]): Weight 201-216, took metolazone PRN, weight stabilized at 210lbs. Considering cardioMEMS.   - 05/13/22 ([name]): Had 3 iron infusions. Doing well w/ volume, weight 211-212lbs. Cooking, staying active around house.   - 05/21/22 ([name]): CP/SOB. Weight 217 from 213. She took metolazone x1 dose.   - 05/22/22 ([name]): Ed follow up. Back down to 213lbs. Still having CP w/ certain movement.   - 06/05/22 ([name]): Weight 214-216lb, should be 212lb.   - 06/09/22, 06/11/22, 06/22/22 ([name]): Weight down from 217 to 213lb, 211lb at home. Feeling well by 6/26.   - 07/15/22 ([name]): Switched inhaler back to Advair/Spiriva with improvement in breathing and O2 sat. Weight 207lbs, feeling more like herself.   - 08/19/22 ([name]): Feeling well. Weight 208-210lbs.   - 10/22/22 ([name]): Feeling well. Weight has been 210-214lbs.   - 12/04/22 ([name]): doing well overall, weight a bit up so taking extra torsemide.   - 02/10/23 ([name]): Weight 213-214lbs, feeling well, eating a lot. Had taken metolazone earlier in the week.   - 02/23/23 [name]): Hospital follow up after mechanical fall with nondisplaced fractures of the left superior and inferior pubic rami. Taking torsemide 40mg , weight 211.   - 04/14/23 ([name]): Weight 210-212. 1st Amvuttra  - 07/14/23 [name]): Weight 211-212 on torsemide 60mg  daily. 2nd Amvuttra.     Interval history  Today Barbara Huber presents to the clinic for hospital follow up. She presented to [name] ED on 8/21 for fatigue, weakness, DOE. She had taken a recent prednisone course for gout. She was felt to be volume up and was admitted to [facility]. She had poor response to IV Lasix and was changed to IV Bumex 3mg  BID and metolazone. [name] was held for low BP. Cr increased to 4.0 with diuresis. She went back to the ER on 8/24 for abdominal pain. She was felt to be euvolemic at a weight of 207lb and IV diuretics were held. She was discharged on torsemide 60mg  daily on 8/26. She presents today accompanied by her daughter. Prior to starting the prednisone, weight was 212. Weight was 206 when discharged and 204 this moring. This is well below her normal weight. Pt and daughter believe she is too dry. She feels weak. She has no get up and go. Her appetite is down.  She had been taking torsemide 60mg  in the morning but yesterday they changed it to her usual regiment of 40 in the morning and 20 in the afternoon and she hasn't been peeing quite as much. She was really peeing with 60mg  at one time.     Cardiovascular History & Procedures:    Cath / PCI:  none    CV Surgery:   none    EP Procedures and Devices:  none    Non-Invasive Evaluation(s):    Echo:  08/19/23  1. The left ventricle is normal in size with moderately increased wall  thickness.    2. 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    3. The left ventricular systolic function is hyperdynamic, LVEF is visually  estimated at >70%.    4. Mitral annular calcification is present (moderate).    5. The mitral valve leaflets are mildly thickened with normal leaflet  mobility.    6. The aortic valve is trileaflet with mildly thickened leaflets with normal  excursion.    7. The left atrium is moderately dilated in size.  8. The right ventricle is normal in size, with normal systolic function.    02/17/23    1. The left ventricle is normal in size with moderately increased wall  thickness.    2. The left ventricular systolic function is hyperdynamic, LVEF is visually  estimated at >70%.    3. The left atrium is moderately dilated in size.    4. The right ventricle is normal in size, with normal systolic function.    5. There is mild pulmonary hypertension.    12/09/21  Summary    1. The left ventricle is normal in size with severely increased wall  thickness.    2. The left ventricular systolic function is hyperdynamic, LVEF is visually  estimated at >70%.    3. There is grade III diastolic dysfunction (severely elevated filling  pressure).    4. The left atrium is moderately to severely dilated in size.    5. The right ventricle is mildly dilated in size, with normal systolic  function.    6. The right atrium is mildly dilated  in size.    7. IVC size and inspiratory change suggest elevated right atrial pressure.  (10-20 mmHg).    04/24/21   1. The left ventricle is normal in size with mildly increased wall  thickness.    2. The left ventricular systolic function is normal, LVEF is visually  estimated at > 55%.    3. There is grade II diastolic dysfunction (elevated filling pressure).    4. Mitral annular calcification is present (mild).    5. The left atrium is mildly dilated in size.    6. The right ventricle is mildly dilated in size, with low normal systolic  function.    7. IVC size and inspiratory change suggest elevated right atrial pressure.  (10-20 mmHg).    09/30/20  Summary    1. Limited study to assess systolic function.    2. IVC size and inspiratory change suggest normal right atrial pressure.  (0-5 mmHg).    3. The left ventricle is normal in size with mildly to moderately increased  wall thickness.    4. The left ventricular systolic function is normal with no obvious wall  motion abnormalities, LVEF is visually estimated at > 55%.    5. Mitral annular calcification is present.    6. The left atrium is mildly dilated in size.    7. The right ventricle is upper normal in size, with reduced systolic  Function.    Cardiac CT/MRI/Nuclear Tests:   None    6 Minute Walk:  None    Cardiopulmonary Stress Tests:   None               Other Past Medical History:  See below for the complete EPIC list of past medical and surgical history.      Allergies:  Nitrofurantoin and Lipitor [atorvastatin]    Current Medications:  Current Outpatient Medications   Medication Sig Dispense Refill    acetaminophen (TYLENOL) 500 MG tablet Take 2 tablets (1,000 mg total) by mouth Three (3) times a day.      albuterol HFA 90 mcg/actuation inhaler Inhale 2 puffs every eight (8) hours as needed for wheezing.      amiodarone (PACERONE) 200 MG tablet TAKE ONE TABLET BY MOUTH ONCE DAILY 90 tablet 3    cranberry 500 mg cap Take 500 mg by mouth daily with evening meal.      ergocalciferol-1,250 mcg, 50,000 unit, (DRISDOL)  1,250 mcg (50,000 unit) capsule Take 1 capsule (1,250 mcg total) by mouth once a week. 12 capsule 3    estradioL (ESTRACE) 0.01 % (0.1 mg/gram) vaginal cream Insert 2 g into the vagina Two (2) times a week. 42.5 g 3    ferrous sulfate 325 (65 FE) MG EC tablet Take 1 tablet (325 mg total) by mouth Every Monday, Wednesday, and Friday. 36 tablet 3    fluticasone propion-salmeterol (ADVAIR HFA) 115-21 mcg/actuation inhaler Inhale 2 puffs two (2) times a day. 12 g 11    gabapentin (NEURONTIN) 300 MG capsule Take 1 capsule (300 mg total) by mouth two (2) times a day. 180 capsule 3    metOLazone (ZAROXOLYN) 5 MG tablet Take 1 tablet (5 mg total) by mouth daily as needed (when instructed by cardiology clinic).      montelukast (SINGULAIR) 10 mg tablet Take 1 tablet (10 mg total) by mouth every morning. 90 tablet 2    NARCAN 4 mg/actuation nasal spray 1 spray into alternating nostrils once as needed (opioid overdose). PRN - Emergency use.      NON FORMULARY APPLY FROM NECK DOWN TWICE A DAY      OXYCONTIN 10 mg TR12 12 hr crush resistant ER/CR tablet Take 1 tablet (10 mg total) by mouth every twelve (12) hours.      OXYGEN-AIR DELIVERY SYSTEMS MISC 5 L by Miscellaneous route. Currently using 3  L/min via [place]      pantoprazole (PROTONIX) 20 MG tablet TAKE 1 TABLET(20 MG) BY MOUTH DAILY 90 tablet 3    rosuvastatin (CRESTOR) 5 MG tablet Take 1 tablet (5 mg total) by mouth every other day. 15 tablet 11    spironolactone (ALDACTONE) 25 MG tablet TAKE 1 TABLET(25 MG) BY MOUTH DAILY 90 tablet 3    tafamidis (VYNDAMAX) 61 mg cap Take 1 capsule (61 mg) by mouth daily. 30 capsule 11    tiotropium bromide (SPIRIVA RESPIMAT) 2.5 mcg/actuation inhalation mist Inhale 2 puffs daily. 4 g 11    torsemide (DEMADEX) 20 MG tablet Take 3 tablets (60 mg total) by mouth daily. 270 tablet 3    vitamin A-3,000 mcg RAE, 10,000 UNIT, 3,000 mcg RAE (10,000 UNIT) capsule Take 1 capsule (3,000 mcg of RAE total) by mouth daily.      vutrisiran (AMVUTTRA) 25 mg/0.5 mL injection Inject 0.5 mL (25 mg total) under the skin once. Every 12 Weeks      colchicine (COLCRYS) 0.6 mg tablet Take 0.3 mg (1/2 tablet) at the first sign of a gout flare. OK to repeat 0.3mg  (1/2 tablet) after 3 days if symptoms have not resolved, then every 3 days until flare resolves. 30 tablet 1     No current facility-administered medications for this visit.       Family History:  The patient's family history includes Cancer in her father; Hypertension in her mother; No Known Problems in her brother, brother, maternal aunt, maternal grandfather, maternal grandmother, maternal uncle, paternal aunt, paternal grandfather, paternal grandmother, paternal uncle, sister, sister, and another family member.    Social history:  She  reports that she quit smoking about 5 years ago. Her smoking use included cigarettes. She has never used smokeless tobacco. She reports that she does not drink alcohol and does not use drugs.    Review of Systems:  As per HPI.  Rest of the review of ten systems is negative or unremarkable except as stated above.    Physical Exam:  VITAL SIGNS:  Vitals:    08/27/23 1140   BP: 104/53   Pulse: 81   SpO2: 93%       Wt Readings from Last 3 Encounters:   08/27/23 92.7 kg (204 lb 4.8 oz)   08/23/23 93.9 kg (207 lb 1.6 oz)   07/14/23 95.7 kg (211 lb)      Today's Body mass index is 32 kg/m??.   Height: 170.2 cm (5' 7)  CONSTITUTIONAL: well-appearing in no acute distress  EYES: Conjunctivae and sclerae clear and anicteric.  ENT: Benign.   CARDIOVASCULAR: JVP not seen above the clavicle with HOB at 90 degrees. Rate and rhythm are regular.  There is no lifts or heaves.  Normal S1, S2. There is no murmur, gallops or rubs.  Radial and pedal pulses are 2+, bilaterally.   There is no edema to ankles, bilaterally.   RESPIRATORY: Decreased breath sounds at bases.  There are no wheezes.  GASTROINTESTINAL: Soft, non-tender, with audible bowel sounds. Abdomen nondistended.  Liver is nonpalpable.  SKIN: No rashes, ecchymosis or petechiae.  Warm, well perfused.   MUSCULOSKELETAL:  no joint swelling   NEURO/PSYCH: Appropriate mood and affect. Alert and oriented to person, place, and time. No gross motor or sensory deficits evident.    Pertinent Laboratory Studies:   No results displayed because visit has over 200 results. Office Visit on 07/30/2023   Component Date Value Ref Range Status    Joint Fluid Culture 07/30/2023 NO GROWTH   Final    Gram Stain Result 07/30/2023 Direct Specimen Gram Stain   Final    Gram Stain Result 07/30/2023 No polymorphonuclear leukocytes seen   Final    Gram Stain Result 07/30/2023 No organisms seen   Final    Fluid Type 07/30/2023 Fluid, Joint   Final    Color, Fluid 07/30/2023 Brown   Final    Appearance, Fluid 07/30/2023 Cloudy   Final    Nucleated Cells, Fluid 07/30/2023 1,150  Undefined ul Final    RBC, Fluid 07/30/2023 5,975  ul Final    Neutrophil %, Fluid 07/30/2023 39.0  % Final    Lymphocytes %, Fluid 07/30/2023 1.0  % Final    Mono/Macro % , Fluid 07/30/2023 60.0  % Final    #Cells Counted BF Diff 07/30/2023 100   Final    Crystal Analysis 07/30/2023 Crystals present (AA)  No crystals seen Final   Office Visit on 07/14/2023   Component Date Value Ref Range Status    Sodium 07/14/2023 139  135 - 145 mmol/L Final    Potassium 07/14/2023 4.4  3.4 - 4.8 mmol/L Final    Chloride 07/14/2023 102  98 - 107 mmol/L Final    CO2 07/14/2023 27.7  20.0 - 31.0 mmol/L Final    Anion Gap 07/14/2023 9  5 - 14 mmol/L Final    BUN 07/14/2023 58 (H)  9 - 23 mg/dL Final    Creatinine 29/56/2130 2.50 (H)  0.55 - 1.02 mg/dL Final    BUN/Creatinine Ratio 07/14/2023 23   Final    eGFR [name]-EPI (2021) Female 07/14/2023 19 (L)  >=60 mL/min/1.21m2 Final    Glucose 07/14/2023 158  70 - 179 mg/dL Final    Calcium 86/57/8469 9.6  8.7 - 10.4 mg/dL Final    Albumin 62/95/2841 3.4  3.4 - 5.0 g/dL Final    Total Protein 07/14/2023 7.6  5.7 - 8.2 g/dL Final    Total Bilirubin 07/14/2023 0.4  0.3 - 1.2 mg/dL Final    AST 32/44/0102  16  <=34 U/L Final    ALT 07/14/2023 8 (L)  10 - 49 U/L Final    Alkaline Phosphatase 07/14/2023 132 (H)  46 - 116 U/L Final    BNP 07/14/2023 118.62 (H)  <=100 pg/mL Final    Magnesium 07/14/2023 2.3  1.6 - 2.6 mg/dL Final    TSH 16/09/9603 1.376  0.550 - 4.780 uIU/mL Final    EKG Ventricular Rate 07/14/2023 69  BPM Final    EKG Atrial Rate 07/14/2023 69  BPM Final    EKG P-R Interval 07/14/2023 188  ms Final    EKG QRS Duration 07/14/2023 108  ms Final    EKG Q-T Interval 07/14/2023 458  ms Final    EKG QTC Calculation 07/14/2023 490  ms Final    EKG Calculated P Axis 07/14/2023 22  degrees Final    EKG Calculated R Axis 07/14/2023 131  degrees Final    EKG Calculated T Axis 07/14/2023 -9  degrees Final    QTC Fredericia 07/14/2023 480  ms Final    Vitamin D Total (25OH) 07/14/2023 33.7  20.0 - 80.0 ng/mL Final       Lab Results   Component Value Date    PRO-BNP 3,254.0 (H) 12/04/2022    PRO-BNP 3,420.0 (H) 07/03/2021    PRO-BNP 905.0 (H) 03/21/2021    Creatinine 3.06 (H) 08/23/2023    Creatinine 3.36 (H) 08/22/2023    Creatinine 0.79 01/06/2011    BUN 91 (H) 08/23/2023    BUN 88 (H) 08/22/2023    BUN 20 01/06/2011    Sodium 137 08/23/2023    Sodium 140 01/06/2011    Potassium 4.2 08/23/2023    Potassium 4.1 01/06/2011    CO2 28.3 08/23/2023    CO2 30 01/06/2011    Magnesium 2.4 08/21/2023    Magnesium 2.0 01/06/2011    Total Bilirubin 0.6 08/21/2023    INR 2.15 07/01/2021    INR 1.0 01/06/2011       Lab Results   Component Value Date    Digoxin Level 0.9 10/01/2020       Lab Results   Component Value Date    TSH 1.376 07/14/2023    Cholesterol 95 12/06/2021    Triglycerides 66 12/06/2021    HDL 42 12/06/2021    Non-HDL Cholesterol 53 (L) 12/06/2021    LDL Calculated 40 12/06/2021       Lab Results   Component Value Date    WBC 10.1 08/23/2023    WBC 12.5 (H) 01/06/2011    HGB 12.1 08/23/2023    HGB 12.9 01/06/2011    HCT 38.0 08/23/2023    HCT 40.8 01/06/2011    Platelet 186 08/23/2023    Platelet 260 01/06/2011       Pertinent Test Results from Today:  None    Other pertinent records were reviewed.    The following are further history from the patient's EPIC record for reference:     Past Medical History:   Diagnosis Date    Acute kidney injury superimposed on chronic kidney disease (CMS-HCC) 10/11/2020 Acute on chronic diastolic (congestive) heart failure (CMS-HCC) 08/23/2020    Acute on chronic diastolic congestive heart failure (CMS-HCC)     AKI (acute kidney injury) (CMS-HCC) 04/14/2015    Lab Results  Component  Value  Date     CREATININE  1.90 (H)  06/12/2021     Had a bump in her creatinine when she was taking her diuretics every day.  She  is currently taking 40 mg daily of torsemide and 50 mg of spironolactone.  Her volume status is fragile.  Previously when she stopped her diuretic she becomes short of breath.  Plan: We will check her BMP today.  We will likely have to go to 40    Anemia 08/22/2019    Iron deficiency anemia          Lab Results      Component    Value    Date           WBC    9.2    03/21/2021           RBC    3.67 (L)    03/21/2021           HGB    9.9 (L)    03/21/2021           HCT    30.7 (L)    03/21/2021           MCV    83.7    03/21/2021           MCH    26.9    03/21/2021           MCHC    32.1    03/21/2021           RDW    21.9 (H)    03/21/2021           PLT    318     Arthritis     Calculus of kidney     Calculus of ureter     CHF (congestive heart failure) (CMS-HCC)     Chronic atrial fibrillation (CMS-HCC) 07/20/2019    COPD (chronic obstructive pulmonary disease) (CMS-HCC)     Diabetes (CMS-HCC)     Gangrenous cholecystitis 10/11/2020    Generalized edema  06/17/2021    GERD (gastroesophageal reflux disease)     Hydronephrosis     Hypertension     Hyponatremia 10/11/2020    Intermediate coronary syndrome (CMS-HCC) 03/13/2014    Lower extremity edema 09/28/2020    Lumbar stenosis     Microscopic hematuria     Nausea alone     Nephrolithiasis 04/17/2016    Neuropathy     Nocturia     Nocturia 07/01/2017    Other chronic cystitis     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  See plans for anemia, AKI  We are also tapering her gabapentin.  Currently on 300 mg 3 times daily.  We will decrease it to twice daily, and then nightly.  She states she is not having any recurrence of her pain.    Pulmonary hypertension (CMS-HCC)     Renal colic     Shortness of breath 04/28/2020    Sleep apnea     Unstable angina pectoris (CMS-HCC) 03/13/2014       Past Surgical History:   Procedure Laterality Date    BACK SURGERY  1995    CARPAL TUNNEL RELEASE Left 2014    HYSTERECTOMY  1971    IR INSERT CHOLECYSTOSMY TUBE PERCUTANEOUS  10/02/2020    IR INSERT CHOLECYSTOSMY TUBE PERCUTANEOUS 10/02/2020 Braulio Conte, MD IMG VIR H&V [facility]    LUMBAR DISC SURGERY      PR REMOVAL GALLBLADDER N/A 10/06/2020    Procedure: CHOLECYSTECTOMY;  Surgeon: Katherina Mires, MD;  Location: MAIN OR [facility];  Service: Trauma    PR RIGHT HEART  CATH O2 SATURATION & CARDIAC OUTPUT N/A 09/30/2020    Procedure: Right Heart Catheterization;  Surgeon: Neal Dy, MD;  Location: [facility] CATH;  Service: Cardiology    PR RIGHT HEART CATH O2 SATURATION & CARDIAC OUTPUT N/A 01/09/2021    Procedure: Right Heart Catheterization;  Surgeon: Lesle Reek, MD;  Location: [facility] CATH;  Service: Cardiology

## 2023-08-27 ENCOUNTER — Ambulatory Visit: Admit: 2023-08-27 | Discharge: 2023-08-28 | Payer: MEDICARE | Attending: Adult Health | Primary: Adult Health

## 2023-08-27 DIAGNOSIS — I5032 Chronic diastolic (congestive) heart failure: Principal | ICD-10-CM

## 2023-08-27 LAB — BASIC METABOLIC PANEL
ANION GAP: 10 mmol/L (ref 5–14)
BLOOD UREA NITROGEN: 96 mg/dL — ABNORMAL HIGH (ref 9–23)
BUN / CREAT RATIO: 28
CALCIUM: 9.6 mg/dL (ref 8.7–10.4)
CHLORIDE: 95 mmol/L — ABNORMAL LOW (ref 98–107)
CO2: 28.2 mmol/L (ref 20.0–31.0)
CREATININE: 3.41 mg/dL — ABNORMAL HIGH
EGFR CKD-EPI (2021) FEMALE: 13 mL/min/{1.73_m2} — ABNORMAL LOW (ref >=60)
GLUCOSE RANDOM: 250 mg/dL — ABNORMAL HIGH (ref 70–179)
POTASSIUM: 4.3 mmol/L (ref 3.4–4.8)
SODIUM: 133 mmol/L — ABNORMAL LOW (ref 135–145)

## 2023-08-27 LAB — MAGNESIUM: MAGNESIUM: 2.2 mg/dL (ref 1.6–2.6)

## 2023-08-27 LAB — B-TYPE NATRIURETIC PEPTIDE: B-TYPE NATRIURETIC PEPTIDE: 324.32 pg/mL — ABNORMAL HIGH (ref <=100)

## 2023-08-27 MED ORDER — COLCHICINE 0.6 MG TABLET
ORAL_TABLET | 1 refills | 0 days | Status: CP
Start: 2023-08-27 — End: ?

## 2023-08-27 NOTE — Unmapped (Addendum)
Today,    MEDICATIONS:  We are changing your medications today.  - Take just 40mg  torsemide Saturday, Sunday, Monday, unless weight gets to 209 or above then add back to 20mg  in the afternoon  - Messsage me on Tuesday and let me know how it's going  - I will send instructions for colchicine   Call if you have questions about your medications.    LABS:  We will call you if your labs need attention.    NEXT APPOINTMENT:  Return to clinic in 6 weeks with me (October 9)       In general, to take care of your heart failure:  -Limit your fluid intake to 2 Liters (half-gallon) per day.    -Limit your salt intake to ideally 2-3 grams (2000-3000 mg) per day.  -Weigh yourself daily and record, and bring that weight diary to your next appointment.  (Weight gain of 2-3 pounds in 1 day typically means fluid weight.)    The medications for your heart are to help your heart and help you live longer.    Please contact us before stopping any of your heart medications.    Call the clinic at [phone] with questions.  Our clinic fax number is [phone].  If you need to reschedule future appointments, please call [phone] or [phone]  My office number (c/o De Burrs RN) is [phone] if you need further assistance.    Please do not send a MyChart message for potentially life-threatening symptoms.  Please call 911 for a true medical emergency.    To learn more about heart failure, please read [place]'s Learning to Live with Heart Failure - Available online at:  [email or web address] - open the window for Medical Management and click the link Living with Heart Failure   (Can search [facility] heart failure on the web to find the link.)

## 2023-08-30 ENCOUNTER — Ambulatory Visit: Admit: 2023-08-30 | Discharge: 2023-09-02 | Payer: MEDICARE

## 2023-08-30 ENCOUNTER — Ambulatory Visit
Admit: 2023-08-30 | Discharge: 2023-09-02 | Disposition: A | Discharge status: Home Health | Payer: MEDICARE | Admitting: Student in an Organized Health Care Education/Training Program

## 2023-08-30 LAB — CBC W/ AUTO DIFF
BASOPHILS ABSOLUTE COUNT: 0.1 10*9/L (ref 0.0–0.1)
BASOPHILS RELATIVE PERCENT: 0.8 %
EOSINOPHILS ABSOLUTE COUNT: 0.1 10*9/L (ref 0.0–0.5)
EOSINOPHILS RELATIVE PERCENT: 1 %
HEMATOCRIT: 39.1 % (ref 34.0–44.0)
HEMOGLOBIN: 12.8 g/dL (ref 11.3–14.9)
LYMPHOCYTES ABSOLUTE COUNT: 0.6 10*9/L — ABNORMAL LOW (ref 1.1–3.6)
LYMPHOCYTES RELATIVE PERCENT: 6.8 %
MEAN CORPUSCULAR HEMOGLOBIN CONC: 32.7 g/dL (ref 32.0–36.0)
MEAN CORPUSCULAR HEMOGLOBIN: 30.2 pg (ref 25.9–32.4)
MEAN CORPUSCULAR VOLUME: 92.3 fL (ref 77.6–95.7)
MEAN PLATELET VOLUME: 8.6 fL (ref 6.8–10.7)
MONOCYTES ABSOLUTE COUNT: 1.1 10*9/L — ABNORMAL HIGH (ref 0.3–0.8)
MONOCYTES RELATIVE PERCENT: 12.3 %
NEUTROPHILS ABSOLUTE COUNT: 7.1 10*9/L (ref 1.8–7.8)
NEUTROPHILS RELATIVE PERCENT: 79.1 %
NUCLEATED RED BLOOD CELLS: 0 /100{WBCs} (ref <=4)
PLATELET COUNT: 320 10*9/L (ref 150–450)
RED BLOOD CELL COUNT: 4.24 10*12/L (ref 3.95–5.13)
RED CELL DISTRIBUTION WIDTH: 14.5 % (ref 12.2–15.2)
WBC ADJUSTED: 9 10*9/L (ref 3.6–11.2)

## 2023-08-30 LAB — COMPREHENSIVE METABOLIC PANEL
ALBUMIN: 3.4 g/dL (ref 3.4–5.0)
ALKALINE PHOSPHATASE: 197 U/L — ABNORMAL HIGH (ref 46–116)
ALT (SGPT): 15 U/L (ref 10–49)
ANION GAP: 11 mmol/L (ref 5–14)
AST (SGOT): 31 U/L (ref <=34)
BILIRUBIN TOTAL: 0.4 mg/dL (ref 0.3–1.2)
BLOOD UREA NITROGEN: 103 mg/dL — ABNORMAL HIGH (ref 9–23)
BUN / CREAT RATIO: 34
CALCIUM: 9.5 mg/dL (ref 8.7–10.4)
CHLORIDE: 99 mmol/L (ref 98–107)
CO2: 24.3 mmol/L (ref 20.0–31.0)
CREATININE: 3.06 mg/dL — ABNORMAL HIGH
EGFR CKD-EPI (2021) FEMALE: 15 mL/min/{1.73_m2} — ABNORMAL LOW (ref >=60)
GLUCOSE RANDOM: 260 mg/dL — ABNORMAL HIGH (ref 70–179)
POTASSIUM: 4.4 mmol/L (ref 3.4–4.8)
PROTEIN TOTAL: 7.7 g/dL (ref 5.7–8.2)
SODIUM: 134 mmol/L — ABNORMAL LOW (ref 135–145)

## 2023-08-30 LAB — PROTIME-INR
INR: 1.2
PROTIME: 13.2 s — ABNORMAL HIGH (ref 9.9–12.6)

## 2023-08-30 LAB — BLOOD GAS, VENOUS
BASE EXCESS VENOUS: 2.9 — ABNORMAL HIGH (ref -2.0–2.0)
HCO3 VENOUS: 26 mmol/L (ref 22–27)
O2 SATURATION VENOUS: 73.3 % (ref 40.0–85.0)
PCO2 VENOUS: 48 mmHg (ref 40–60)
PH VENOUS: 7.38 (ref 7.32–7.43)
PO2 VENOUS: 41 mmHg — ABNORMAL HIGH (ref 35–40)

## 2023-08-30 LAB — HIGH SENSITIVITY TROPONIN I - 2 HOUR SERIAL
HIGH SENSITIVITY TROPONIN - DELTA (0-2H): 2 ng/L (ref <=7)
HIGH-SENSITIVITY TROPONIN I - 2 HOUR: 162 ng/L (ref <=34)

## 2023-08-30 LAB — HIGH SENSITIVITY TROPONIN I - SERIAL: HIGH SENSITIVITY TROPONIN I: 164 ng/L (ref <=34)

## 2023-08-30 LAB — B-TYPE NATRIURETIC PEPTIDE: B-TYPE NATRIURETIC PEPTIDE: 450.68 pg/mL — ABNORMAL HIGH (ref <=100)

## 2023-08-31 LAB — PROTEIN / CREATININE RATIO, URINE
CREATININE, URINE: 75.3 mg/dL
PROTEIN URINE: 7.4 mg/dL
PROTEIN/CREAT RATIO, URINE: 0.098

## 2023-08-31 LAB — URINALYSIS WITH MICROSCOPY
BILIRUBIN UA: NEGATIVE
GLUCOSE UA: NEGATIVE
HYALINE CASTS: 6 /LPF — ABNORMAL HIGH (ref 0–1)
KETONES UA: NEGATIVE
NITRITE UA: NEGATIVE
PH UA: 5 (ref 5.0–9.0)
PROTEIN UA: NEGATIVE
RBC UA: <1 /HPF (ref <=4)
SPECIFIC GRAVITY UA: 1.013 (ref 1.003–1.030)
SQUAMOUS EPITHELIAL: 2 /HPF (ref 0–5)
UROBILINOGEN UA: <2
WBC UA: 3 /HPF (ref 0–5)

## 2023-08-31 LAB — CORTISOL: CORTISOL TOTAL: 41.5 ug/dL

## 2023-08-31 LAB — COMPREHENSIVE METABOLIC PANEL
ALBUMIN: 3 g/dL — ABNORMAL LOW (ref 3.4–5.0)
ALKALINE PHOSPHATASE: 174 U/L — ABNORMAL HIGH (ref 46–116)
ALT (SGPT): 10 U/L (ref 10–49)
ANION GAP: 13 mmol/L (ref 5–14)
AST (SGOT): 28 U/L (ref <=34)
BILIRUBIN TOTAL: 0.5 mg/dL (ref 0.3–1.2)
BLOOD UREA NITROGEN: 99 mg/dL — ABNORMAL HIGH (ref 9–23)
BUN / CREAT RATIO: 35
CALCIUM: 9.4 mg/dL (ref 8.7–10.4)
CHLORIDE: 101 mmol/L (ref 98–107)
CO2: 26.2 mmol/L (ref 20.0–31.0)
CREATININE: 2.82 mg/dL — ABNORMAL HIGH
EGFR CKD-EPI (2021) FEMALE: 16 mL/min/{1.73_m2} — ABNORMAL LOW (ref >=60)
GLUCOSE RANDOM: 132 mg/dL (ref 70–179)
POTASSIUM: 3.9 mmol/L (ref 3.4–4.8)
PROTEIN TOTAL: 6.9 g/dL (ref 5.7–8.2)
SODIUM: 140 mmol/L (ref 135–145)

## 2023-08-31 LAB — HEMOGLOBIN A1C
ESTIMATED AVERAGE GLUCOSE: 120 mg/dL
HEMOGLOBIN A1C: 5.8 % — ABNORMAL HIGH (ref 4.8–5.6)

## 2023-08-31 LAB — CBC
HEMATOCRIT: 36.5 % (ref 34.0–44.0)
HEMOGLOBIN: 11.9 g/dL (ref 11.3–14.9)
MEAN CORPUSCULAR HEMOGLOBIN CONC: 32.6 g/dL (ref 32.0–36.0)
MEAN CORPUSCULAR HEMOGLOBIN: 30.1 pg (ref 25.9–32.4)
MEAN CORPUSCULAR VOLUME: 92.5 fL (ref 77.6–95.7)
MEAN PLATELET VOLUME: 8.4 fL (ref 6.8–10.7)
PLATELET COUNT: 269 10*9/L (ref 150–450)
RED BLOOD CELL COUNT: 3.95 10*12/L (ref 3.95–5.13)
RED CELL DISTRIBUTION WIDTH: 14.8 % (ref 12.2–15.2)
WBC ADJUSTED: 8.2 10*9/L (ref 3.6–11.2)

## 2023-08-31 LAB — HIGH SENSITIVITY TROPONIN I - 6 HOUR SERIAL
HIGH SENSITIVITY TROPONIN - DELTA (2-6H): 11 ng/L — ABNORMAL HIGH (ref <=7)
HIGH-SENSITIVITY TROPONIN I - 6 HOUR: 173 ng/L (ref <=34)

## 2023-08-31 LAB — TSH: THYROID STIMULATING HORMONE: 0.963 u[IU]/mL (ref 0.550–4.780)

## 2023-08-31 LAB — T3, FREE: T3 FREE: 1.77 pg/mL — ABNORMAL LOW (ref 2.30–4.20)

## 2023-08-31 LAB — PHOSPHORUS: PHOSPHORUS: 4.5 mg/dL (ref 2.4–5.1)

## 2023-08-31 LAB — MAGNESIUM: MAGNESIUM: 2.3 mg/dL (ref 1.6–2.6)

## 2023-08-31 LAB — T4, FREE: FREE T4: 1.79 ng/dL — ABNORMAL HIGH (ref 0.89–1.76)

## 2023-08-31 MED ADMIN — insulin lispro (HumaLOG) injection 0-20 Units: 0-20 [IU] | SUBCUTANEOUS | @ 17:00:00

## 2023-08-31 MED ADMIN — oxyCODONE (OxyCONTIN) 12 hr crush resistant ER/CR tablet 10 mg: 10 mg | ORAL | @ 14:00:00 | Stop: 2023-09-14

## 2023-08-31 MED ADMIN — amiodarone (PACERONE) tablet 200 mg: 200 mg | ORAL | @ 14:00:00

## 2023-08-31 MED ADMIN — bumetanide (BUMEX) injection 3 mg: 3 mg | INTRAVENOUS | @ 18:00:00 | Stop: 2023-08-31

## 2023-08-31 MED ADMIN — montelukast (SINGULAIR) tablet 10 mg: 10 mg | ORAL | @ 14:00:00

## 2023-08-31 MED ADMIN — fluticasone furoate-vilanterol (BREO ELLIPTA) 100-25 mcg/dose inhaler 1 puff: 1 | RESPIRATORY_TRACT | @ 14:00:00

## 2023-08-31 MED ADMIN — pantoprazole (Protonix) EC tablet 20 mg: 20 mg | ORAL | @ 14:00:00

## 2023-08-31 MED ADMIN — gabapentin (NEURONTIN) capsule 100 mg: 100 mg | ORAL | @ 14:00:00

## 2023-08-31 MED ADMIN — ondansetron (ZOFRAN-ODT) disintegrating tablet 4 mg: 4 mg | ORAL | @ 15:00:00

## 2023-08-31 MED ADMIN — acetaminophen (TYLENOL) tablet 650 mg: 650 mg | ORAL | @ 22:00:00

## 2023-08-31 MED ADMIN — umeclidinium (INCRUSE ELLIPTA) 62.5 mcg/actuation inhaler 1 puff: 1 | RESPIRATORY_TRACT | @ 14:00:00

## 2023-08-31 MED ADMIN — furosemide (LASIX) injection 40 mg: 40 mg | INTRAVENOUS | @ 04:00:00 | Stop: 2023-08-30

## 2023-08-31 MED ADMIN — bumetanide (BUMEX) injection 3 mg: 3 mg | INTRAVENOUS | @ 14:00:00 | Stop: 2023-08-31

## 2023-08-31 NOTE — Unmapped (Signed)
Pt. Arrived to the unit at 0205 from the ED. No complaints of pain. Oxygen at 4 liters via [place]. Blood sugars AC/HS. Daily weights. Tolerating fluids. Incontinent urine. Purewick in place.   Alert and oriented x 4.  Daughter at bedside. PIV flushed and maintained. All safety measures maintained. Will continue to monitor.  Possible discharge on 09/03/23  Problem: Adult Inpatient Plan of Care  Goal: Plan of Care Review  Outcome: Progressing  Flowsheets (Taken 08/31/2023 0621)  Progress: improving  Outcome Evaluation: Discharge home  Plan of Care Reviewed With:   patient   child  Goal: Patient-Specific Goal (Individualized)  Outcome: Progressing  Flowsheets (Taken 08/31/2023 0621)  Patient/Family-Specific Goals (Include Timeframe): Pt. will have decreased troponins from 7p-7a.  Individualized Care Needs: Telemetry, Pulsox, O2, daily weight, fluid restriction, A/HS monitor labs/VS  Anxieties, Fears or Concerns: Denies  Goal: Absence of Hospital-Acquired Illness or Injury  Outcome: Progressing  Intervention: Identify and Manage Fall Risk  Recent Flowsheet Documentation  Taken 08/31/2023 0205 by Rhina Brackett, RN  Safety Interventions:   fall reduction program maintained   low bed  Intervention: Prevent Skin Injury  Recent Flowsheet Documentation  Taken 08/31/2023 0600 by Rhina Brackett, RN  Positioning for Skin: Supine/Back  Taken 08/31/2023 0400 by Rhina Brackett, RN  Positioning for Skin: Supine/Back  Taken 08/31/2023 0230 by Rhina Brackett, RN  Positioning for Skin: Supine/Back  Taken 08/31/2023 0205 by Rhina Brackett, RN  Positioning for Skin: Supine/Back  Intervention: Prevent and Manage VTE (Venous Thromboembolism) Risk  Recent Flowsheet Documentation  Taken 08/31/2023 0600 by Rhina Brackett, RN  Anti-Embolism Device Type: SCD, Knee  Anti-Embolism Intervention: On  Anti-Embolism Device Location: BLE  Taken 08/31/2023 0400 by Rhina Brackett, RN  Anti-Embolism Device Type: SCD, Knee  Anti-Embolism Intervention: On  Anti-Embolism Device Location: BLE  Taken 08/31/2023 0230 by Rhina Brackett, RN  Anti-Embolism Device Type: SCD, Knee  Anti-Embolism Intervention: On  Anti-Embolism Device Location: BLE  Taken 08/31/2023 0205 by Rhina Brackett, RN  Anti-Embolism Device Type: SCD, Knee  Anti-Embolism Intervention: On  Anti-Embolism Device Location: BLE  Goal: Optimal Comfort and Wellbeing  Outcome: Progressing  Goal: Readiness for Transition of Care  Outcome: Progressing  Goal: Rounds/Family Conference  Outcome: Progressing     Problem: Fall Injury Risk  Goal: Absence of Fall and Fall-Related Injury  Outcome: Progressing  Intervention: Promote Injury-Free Environment  Recent Flowsheet Documentation  Taken 08/31/2023 0205 by Rhina Brackett, RN  Safety Interventions:   fall reduction program maintained   low bed     Problem: Self-Care Deficit  Goal: Improved Ability to Complete Activities of Daily Living  Outcome: Progressing

## 2023-08-31 NOTE — Unmapped (Signed)
OCCUPATIONAL THERAPY  Evaluation (08/31/23 0928)    Patient Name:  Barbara Huber       Medical Record Number: [id number]     Date of Birth: 1941/09/27  Sex: Female      Post-Discharge Occupational Therapy Recommendations: 3x weekly          Equipment Recommendation  OT DME Recommendations: None       OT Treatment Diagnosis: Generalized muscle weakness, Need for assistance with personal care, Reduced mobility         Assessment  Problem List: Decreased activity tolerance, Decreased endurance, Fall risk, Decreased strength, Decreased mobility, Impaired ADLs, Gait deviation        Clinical Decision Making: Moderate Complexity    Assessment: Barbara Huber is a 82 y.o. 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     Patient presents to OT with decreased endurance and strength impacting pt's independence with ADLs and functional mobility/transfers compared to baseline. Patient educated on purpose and goal of OT. At baseline, pt is mod I for self care and mobility with use of rollator. Pt on 4L [place] during assessment. Pt is currently functioning below baseline, demonstrating  functional sit<>stand transfers with CGA using RW,  and functional mobility with SBA using RW. SpO2 decreased to 80% with walking short household distances; increased to >88% with 2 minute seated rest break. Pt completing toileting tasks with CGA (for toilet t/f) using RW, LE dressing with Min A (for donning footwear), and standing grooming tasks with set-up assist, due to decreased activity tolerance, impeding independence. Pt to benefit from continued acute OT to further address activity tolerance and strengthening. After review of the patient's occupational profile and history, assessment of occupational performance, clinical decision making, and development of POC, the patient presents as a mod complexity case. At this time, recommend post acute 3x with family supervision to maximize abilities in above mentioned deficits.     Today's Interventions: ADL retraining, Education - Family / caregiver, Endurance activities, Balance activities, Education - Patient, Occupational hygienist, Engineer, water. training, Functional mobility, Transfer training, Range of motion, UE Strength / coordination exercise, Functional cognition  Today's Interventions: Role of OT and POC, self care, standing balance/tolerance, ROM, MMT, functional mobility, endurance, functional cognition    Activity Tolerance During Today's Session  Tolerated treatment well    Plan  Planned Frequency of Treatment:    1-2x per day for: 2-3x week       Planned Interventions:  Education (Patient/Family/Caregiver), Self-Care/Home Training, Therapeutic Activity      GOALS:   Patient and Family Goals: Return to Independence.    Short Term:   SHORT GOAL #1: Pt will peform toilet t/f and toileting tasks with supervision with LRAD   Time Frame : 2 weeks  SHORT GOAL #2: Pt will completed FBD with set-up assist using AE PRN and LRAD   Time Frame : 2 weeks  SHORT GOAL #3: Pt will tolerate 4+ minutes of standing while completeing ADLs with supervision using LRAD   Time Frame : 2 weeks                   Long Term Goal #1: Pt will score 22+/24 on AMPAC in 4 weeks       Prognosis:  Excellent  Positive Indicators:  PLOF, motivation, support  Barriers to Discharge: None    Subjective  Medical Updates Since Last Visit/Relevant PMH Affecting Clinical Decision Making: n/a  Prior Functional Status Pt reports grossly Mod I at baseline using Rollator, lives with daughter and son in law. Pt enjoys watching talk shows and preparing light meals. Reports daily routines have been slightly more challenging 2/2 limited endurance recently. Pt is on 3L O2  at baseline.    Medical Tests / Procedures: Reviewed in [facility]  Services patient receives prior to admission: OT, PT    Patient / Caregiver reports: RN and pt agreeable to OT      Past Medical History:   Diagnosis Date    Acute kidney injury superimposed on chronic kidney disease (CMS-HCC) 10/11/2020    Acute on chronic diastolic (congestive) heart failure (CMS-HCC) 08/23/2020    Acute on chronic diastolic congestive heart failure (CMS-HCC)     AKI (acute kidney injury) (CMS-HCC) 04/14/2015    Lab Results  Component  Value  Date     CREATININE  1.90 (H)  06/12/2021     Had a bump in her creatinine when she was taking her diuretics every day.  She is currently taking 40 mg daily of torsemide and 50 mg of spironolactone.  Her volume status is fragile.  Previously when she stopped her diuretic she becomes short of breath.  Plan: We will check her BMP today.  We will likely have to go to 40    Anemia 08/22/2019    Iron deficiency anemia          Lab Results      Component    Value    Date           WBC    9.2    03/21/2021           RBC    3.67 (L)    03/21/2021           HGB    9.9 (L)    03/21/2021           HCT    30.7 (L)    03/21/2021           MCV    83.7    03/21/2021           MCH    26.9    03/21/2021           MCHC    32.1    03/21/2021           RDW    21.9 (H)    03/21/2021           PLT    318     Arthritis     Calculus of kidney     Calculus of ureter     CHF (congestive heart failure) (CMS-HCC)     Chronic atrial fibrillation (CMS-HCC) 07/20/2019    COPD (chronic obstructive pulmonary disease) (CMS-HCC)     Diabetes (CMS-HCC)     Gangrenous cholecystitis 10/11/2020    Generalized edema  06/17/2021    GERD (gastroesophageal reflux disease)     Hydronephrosis     Hypertension     Hyponatremia 10/11/2020    Intermediate coronary syndrome (CMS-HCC) 03/13/2014    Lower extremity edema 09/28/2020    Lumbar stenosis     Microscopic hematuria     Nausea alone     Nephrolithiasis 04/17/2016    Neuropathy     Nocturia     Nocturia 07/01/2017    Other chronic cystitis     Persistent fatigue  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  See plans for anemia, AKI  We are also tapering her gabapentin.  Currently on 300 mg 3 times daily.  We will decrease it to twice daily, and then nightly.  She states she is not having any recurrence of her pain.    Pulmonary hypertension (CMS-HCC)     Renal colic     Shortness of breath 04/28/2020    Sleep apnea     Unstable angina pectoris (CMS-HCC) 03/13/2014    Social History     Tobacco Use    Smoking status: Former     Current packs/day: 0.00     Types: Cigarettes     Quit date: 2019     Years since quitting: 5.6    Smokeless tobacco: Never    Tobacco comments:     Quit a few years ago   Substance Use Topics    Alcohol use: No      Past Surgical History:   Procedure Laterality Date    BACK SURGERY  1995    CARPAL TUNNEL RELEASE Left 2014    HYSTERECTOMY  1971    IR INSERT CHOLECYSTOSMY TUBE PERCUTANEOUS  10/02/2020    IR INSERT CHOLECYSTOSMY TUBE PERCUTANEOUS 10/02/2020 Braulio Conte, MD IMG VIR [facility]    LUMBAR DISC SURGERY      PR REMOVAL GALLBLADDER N/A 10/06/2020    Procedure: CHOLECYSTECTOMY;  Surgeon: Katherina Mires, MD;  Location: MAIN OR [facility];  Service: Trauma    PR RIGHT HEART CATH O2 SATURATION & CARDIAC OUTPUT N/A 09/30/2020    Procedure: Right Heart Catheterization;  Surgeon: Neal Dy, MD;  Location: [facility] CATH;  Service: Cardiology    PR RIGHT HEART CATH O2 SATURATION & CARDIAC OUTPUT N/A 01/09/2021    Procedure: Right Heart Catheterization;  Surgeon: Lesle Reek, MD;  Location: [facility] CATH;  Service: Cardiology    Family History   Problem Relation Age of Onset    Hypertension Mother     Cancer Father         COLON CANCER    No Known Problems Sister     No Known Problems Sister     No Known Problems Brother     No Known Problems Brother     No Known Problems Maternal Aunt     No Known Problems Maternal Uncle     No Known Problems Paternal Aunt     No Known Problems Paternal Uncle     No Known Problems Maternal Grandmother     No Known Problems Maternal Grandfather     No Known Problems Paternal Grandmother     No Known Problems Paternal Grandfather     No Known Problems Other     Anesthesia problems Neg Hx     Broken bones Neg Hx     Clotting disorder Neg Hx     Collagen disease Neg Hx     Diabetes Neg Hx     Dislocations Neg Hx     Fibromyalgia Neg Hx     Gout Neg Hx     Hemophilia Neg Hx     Osteoporosis Neg Hx     Rheumatologic disease Neg Hx     Scoliosis Neg Hx     Severe sprains Neg Hx     Sickle cell anemia Neg Hx     Spinal Compression Fracture Neg Hx     GU problems Neg Hx     Kidney  cancer Neg Hx     Prostate cancer Neg Hx         Nitrofurantoin and Lipitor [atorvastatin]     Objective Findings  Precautions / Restrictions  Falls precautions       Weight Bearing  Non-applicable    Required Braces or Orthoses  Non-applicable    Communication Preference  Verbal       Pain  No c/o pain this session    Equipment / Environment  Vascular access (PIV, TLC, Port-a-cath, PICC), Supplemental oxygen    Living Situation  Living Environment: House  Lives With: Daughter, Family  Home Living: One level home, Walk-in shower, Handicapped height toilet, Grab bars in shower, Built-in shower seat, Shower chair with back, Ramped entrance  Caregiver Identified?: Yes  Caregiver Availability: 24 hours  Caregiver Ability: Limited lifting  Equipment available at home: Rollator, Oxygen     Cognition   Orientation Level:  Oriented x 4   Arousal/Alertness:  Appropriate responses to stimuli   Attention Span:  Appears intact   Memory:  Appears intact   Following Commands:  Follows all commands and directions without difficulty   Safety Judgment:  Good awareness of safety precautions   Awareness of Errors and Problem Solving:  Patient self-corrected errors   Comments: n/a    Vision / Hearing   Vision: Wears glasses all the time     Hearing: No deficit identified         Hand Function:  Right Hand Function: Right hand grip strength, ROM and coordination WNL  Left Hand Function: Left hand grip strength, ROM and coordination WNL  Hand Dominance: Right    Skin Inspection:  Skin Inspection: Intact where visualized    Face/Cervical ROM:  Face ROM: WFL  Cervical ROM: WFL    ROM / Strength:  UE ROM/Strength: Left WFL, Right WFL  LE ROM/Strength: Left WFL, Right WFL    Coordination:  Coordination: WFL    Sensation:  RUE Sensation: RUE intact  LUE Sensation: LUE intact  RLE Sensation: RLE impaired  RLE Sensation Impairment: chronic peripheral neuropathy  LLE Sensation: LLE impaired  LLE Sensation Impairment: chronic peripheral neuropathy    Balance:  Static Sitting-Level of Assistance: Independent  Dynamic Sitting-Level of Assistance: Archivist Standing-Level of Assistance: Supervision  Dynamic Standing - Level of Assistance: Supervision  Standing Balance comments: using RW    Functional Mobility  Transfers: Contact Guard assist  Bed Mobility - Needs Assistance:  (NT)  Ambulation: Pt walked short household distances with SBA using RW    ADLs  ADLs: Needs assistance with ADLs  ADLs - Needs Assistance: LB dressing, UB dressing, Toileting, Grooming  Grooming - Needs Assistance: Set Up Assist  Toileting - Needs Assistance: Contact Guard assist  UB Dressing - Needs Assistance: Set Up Assist  LB Dressing - Needs Assistance: Min assist  IADLs: NT    Vitals / Orthostatics  Vitals/Orthostatics: SpO2 decreased to 80% when walking back from bathroom, while on 4L [place]. Required ~ to recover >/= 88%    Patient at end of session: All needs in reach, In bed, Friends/Family present, Lines intact, Nurse notified     Occupational Therapy Session Duration  OT Individual [mins]: 38       AM-PAC-Daily Activity  Lower Body Dressing assistance needs: A Little - Minimal/Contact Guard Assist/Supervision  Bathing assistance needs: A Little - Minimal/Contact Guard Assist/Supervision  Toileting assistance needs: A Little - Minimal/Contact Guard Assist/Supervision  Upper Body Dressing assistance  needs: A Little - Minimal/Contact Guard Assist/Supervision  Personal Grooming assistance needs: A Little - Minimal/Contact Guard Assist/Supervision  Eating Meals assistance needs: None - Modified Independent/Independent    Daily Activity Score: 19    Score (in points): % of Functional Impairment, Limitation, Restriction  6: 100% impaired, limited, restricted  7-8: At least 80%, but less than 100% impaired, limited restricted  9-13: At least 60%, but less than 80% impaired, limited restricted  14-19: At least 40%, but less than 60% impaired, limited restricted  20-22: At least 20%, but less than 40% impaired, limited restricted  23: At least 1%, but less than 20% impaired, limited restricted  24: 0% impaired, limited restricted      I attest that I have reviewed the above information.  Signed: Brain Hilts, OT  Filed 08/31/2023

## 2023-08-31 NOTE — Unmapped (Addendum)
Barbara Huber is a 82 y.o. 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      Dyspnea on exertion, history of heart failure with preserved EF with acute exacerbation, cardiac amyloidosis (TTR)-the patient received steroids for episode of gout at the beginning of August and then had significant increase in water weight and was hospitalized for a heart failure exacerbation through August 26.  However, never returned to baseline and had worsening breathing after this, BNP elevated in the 400s and mild pulmonary edema on chest x-ray likely caused by acute exacerbation of congestive heart failure.  Weight was down on admission but the patient had had poor intake likely due to gut edema over the past few weeks which may have caused weight loss despite fluid retention.  Her dyspnea improved with diuresis since admission.  She received 3 mg of IV Bumex and then increased to 4 with improvement of DOE.  She will discharge on home torsemide. Weight on discharge. Cardiology follow-up***.   Consider cardiac rehab**      Poor oral intake malaise and fatigue-poor oral intake is likely due to gut edema leading to poor intake as daughter notes that this often occurs with a heart failure exacerbation.  Intake has improved throughout admission.  Cortisol checked and appropriate.  TSH consistent with nonthyroidal illness.  She was seen by PT and OT and recommended for home health.  She will continue home health on discharge.     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  There was no signs of ACS or admission.  Troponin was flat.     COPD ,Chronic respiratory failure with hypoxia, Sleep apnea in adult- Stable on 3L O2 at home with negative RPP.  Initially required more oxygen with exertion but improved with diuresis.  No signs of COPD exacerbation.      Persistent atrial fibrillation-is on chronic amiodarone 200 mg daily for atrial fibrillation.  She is no longer on apixaban due to to bleeding episodes and anemia in the past.  Considered that DOE could be secondary to amiodarone toxicity but since improved with diuresis felt less likely.    Anemia in chronic kidney disease, AoCD. Hgb normal in 11-12 range for >1 year now.  Continues on oral iron.     Chronic kidney disease, stage IVCr usually in 2.5-3.5 range. Home regimen: Torsemide just reduced from 60 to 40 mg daily, spironolactone. Follows with [facility] nephrology.  Creatinine closely monitored with diuresis and remained stable to improved and was *** on the day of discharge. 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

## 2023-08-31 NOTE — Unmapped (Signed)
Pt reports fatigue & shob.  Released from hospital last Monday.  On home oxygen 3L all the time.  Didn't feel well when discharged.  PCP on Friday concerned for possible dehydration.

## 2023-08-31 NOTE — Unmapped (Signed)
Adult Nutrition Assessment Note    Visit Type: RN Consult  Reason for Visit: Assessment (Nutrition)    HPI & PMH:  82 y.o. 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   Usual Body Weight:  210 lb per chart review    Weight history prior to admission:  No significant loss per chart review and patient report    Wt Readings from Last 25 Encounters:   08/31/23 93 kg (205 lb)   08/27/23 92.7 kg (204 lb 4.8 oz)   08/23/23 93.9 kg (207 lb 1.6 oz)   07/14/23 95.7 kg (211 lb)   07/14/23 95.5 kg (210 lb 8 oz)   06/17/23 95.3 kg (210 lb)   04/14/23 95.7 kg (211 lb)   04/14/23 95.7 kg (211 lb)   04/09/23 96.2 kg (212 lb)   03/18/23 96.6 kg (213 lb)   02/23/23 97 kg (213 lb 12.8 oz)   02/18/23 97.9 kg (215 lb 14.4 oz)   02/10/23 98 kg (216 lb)   12/04/22 99.3 kg (219 lb)   10/28/22 97.1 kg (214 lb)   10/22/22 97.5 kg (215 lb)   10/22/22 97.5 kg (215 lb)   10/15/22 97.1 kg (214 lb)   08/19/22 96.7 kg (213 lb 3.2 oz)   07/22/22 94.3 kg (208 lb)   07/15/22 94.3 kg (208 lb)   06/22/22 96.8 kg (213 lb 6.4 oz)   06/11/22 97.8 kg (215 lb 8 oz)   06/09/22 98.8 kg (217 lb 12.8 oz)   06/05/22 98.6 kg (217 lb 6.4 oz)            Weight changes this admission:   Last 5 Recorded Weights    08/30/23 1834 08/31/23 0205   Weight: 93 kg (205 lb) 93 kg (205 lb)        Nutrition Focused Physical Exam:  Nutrition Focused Physical Exam:  Fat Areas Examined  Upper Arm: No loss      Muscle Areas Examined  Temple: No loss  Clavicle: No loss  Scapular: No loss  Dorsal Hand: No loss  Patellar: No loss  Anterior Thigh: No loss  Posterior Calf: No loss              Nutrition Evaluation  Overall Impressions: No fat loss, No muscle loss (08/31/23 1517)      NUTRITIONALLY RELEVANT DATA     Medications:   Nutritionally pertinent medications reviewed and evaluated for potential food and/or medication interactions.   Humalog, Protonix, Miralax, Ferrous sulfate    Labs:   Nutritionally pertinent labs reviewed.     Nutrition History:   August 31, 2023: Prior to admission:  Patient reports 2-3 weeks of poor appetite and poor PO intake per patient report. Patient reports having decreased appetite with no symptoms of nausea or abdominal pain prior to admission. Patient endorses some nausea today after consuming 50% Nepro    Allergies, Intolerances, Sensitivities, and/or Cultural/Religious Dietary Restrictions: none identified per chart review at this time       Current Nutrition:  Oral intake     Nutrition Orders  Supplement Adult; Ensure Clear (Clear Liquid); # of Products PER Serving: 1 At [employer] starting at 09/04 1200    Supplement Adult; Nepro (Reduced Fluid/Lytes High Pro); # of Products PER Serving: 1 2xd PC starting at 09/03 0900    Nutrition Therapy Regular/House; Fluid 2000 ml; Sodium Restricted (No Added Salt) starting at 09/03 0118            Nutritional Needs:   Healthy balance of carbohydrate, protein, and fat.       Malnutrition Assessment using AND/ASPEN or GLIM Clinical Characteristics:    Patient does not meet AND/ASPEN criteria for malnutrition at this time (08/31/23 1517)               GOALS and EVALUATION     Patient to consume 75% or greater of po intake via combination of meals, snacks, and/or oral supplements within admission.  - New    Motivation, Barriers, and Compliance:  Evaluation of motivation, barriers, and compliance pending at this time due to clinical status.     NUTRITION ASSESSMENT     Current  nutrition therapy is appropriate although not meeting nutritional  needs at this time due to poor appetite  Patient would benefit from continuing oral supplement to better meet nutritional needs.  Patient consumed 25% of breakfast, says she is going to try to finish the rest but it was then lunch time  Patient endorses feeling nauseous after consuming 50% Nepro shake- encouraged patient to request ice from RN if Nepro shake not cold enough to help with nausea if able  Patient appropriate to continue Nepro given stage 4 [name]  Can trial Ensure clear given low potassium and phosphorus in formula- may need to adjust insulin given higher amount of carbs in formula    Discharge Planning:   Monitor for potential discharge needs with multi-disciplinary team.     Was the nutrition care plan completed? No, patient does not meet malnutrition criteria        NUTRITION INTERVENTIONS and RECOMMENDATION     Trial Ensure clear once daily  Continue nepro BID  Weigh weekly  Document PO intakes    Follow-Up Parameters:   1-2 times per week (and more frequent as indicated)    Alesia Morin, RD, LDN

## 2023-08-31 NOTE — Unmapped (Signed)
The patient was seen and evaluated by me on 9-3.  I discussed with the patient as well as her daughter who is at the bedside at signs of assessment.    Review, has a history of heart failure.  Weight is down although has had poor intake since last hospitalization and feeling of early satiety so weight loss may not reflect fluid balance.  She was sitting in the chair at the time of the assessment and her JVP was around 4 to 5 cm above the clavicle.  She had B-lines in her lower lung fields.  Faint bibasilar crackles.  The patient does not have lower extremity edema but per daughter at the bedside often times her weight is carried in her abdomen and does not usually get lower extremity edema with increased fluid on.    She worked with PT and had notable desaturations during activity with oxygen following less than 88%.    We discussed the plan of trying diuresis and assessing response.  We also discussed that she been on amiodarone so if shortness of breath is not improving with diuresis we will could consider getting a CT of the chest to assess if other etiologies contributing.    She was instructed how to use the incentive spirometer and use was encouraged during the visit.    Pixie Casino, hospitalist

## 2023-08-31 NOTE — Unmapped (Signed)
[facility] Medicine   History and Physical       Assessment and Plan     Barbara Huber is a 82 y.o. 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     Dyspnea on exertion   Differential: up trending BNP and mild pulm edema on CXR raises concern for acute exacerbation of CHF though exam without obvious e/o volume overload and her weight is only 205lbs (discharged at 207lbs on 8/26 and weighed 204 on 8/30). Also considered generalized deconditioning given recent hospitalization, poor oral intake x1 month. Not c/w PNA, URI, or COPD exacerbation. Less likely PE given no increased oxygen requirement, anginal equivalent symptom given chronicity and recent reassuring TTE (8/21)/EKGs.   PLAN  - Treating for CHF exacerbation (see below), monitor for improvement  - If not improving with diuresis, re-broaden differential/work up    Pulmonary hypertension   Chronic heart failure with preserved ejection fraction  Cardiac amyloidosis   C/f CHF exacerbation  Secondary to TTR amyloid. Followed by Dr Emi Holes at [facility]. Repeat TTE 08/18/23: EF >70%, normal RV function. LV with moderately increased wall thickness, left atrium moderately dilated. Unclear if her heart disease is really driving her presentation of failure to thrive, fatigue, DOE. However, BNP is up and if she's losing weight from not eating as much it is possible she is volume overloaded enough to warrant aggressive diuresis.   PLAN  - On tafamidis and amvuttra  - Ordered IV Bumex 3mg  timed for 8AM, if no output, add metolazone (effective for her during recent hospitalization)  - Holding torsemide/spironolactone while diuresing with IV meds  - Daily BMP, Mag, Phos while diuresing  - Strict I/Os, daily standing weight  - Cardiology consult in AM; to help with assessment/diuretic regimen    Poor oral intake  Malaise, fatigue  - Empiric nepro supplements BID  - PT/OT consulted  - Treating for CHF exacerbation  - Check TSH, AM cortisol, pending    Elevated troponin  EKG with Q waves in inferior leads but no ST changes or significant changes when compared to prior. Trop 160-170. Can stop trending given flat trend.     Secondary/Additional Active Problems:    COPD   Chronic respiratory failure with hypoxia  Sleep apnea in adult  - Stable on 3L O2 at home  - No cough, URI symptoms, negative rapid viral panel  - Would likely benefit from pulm or cardiac rehab  - Ordered CPAP while here; if not using at home referral for ambulatory sleep study    Essential hypertension   - On spirinolactone 25 mg every day, torsemide 40 mg qd    Type 2 diabetes mellitus  Will continue glargine here given reports of hyperglycemia when held. Glucose >250 in ED  - On lantus 4 unit qhs at bedtime  - SSI TIDAC  - A1c due, pending     Persistent atrial fibrillation   - On amiodarone 200 mg every day  - Off apixaban due to c/f bleeding/anemia in the past        Anemia in chronic kidney disease  AoCD  Hgb normal in 11-12 range for >1 year now.  - Empiric nutritional support, iron 3x weekly    Chronic kidney disease, stage IV  Cr usually in 2.5-3.5 range. Home regimen: Torsemide just reduced from 60 to 40 mg daily, spironolactone.  -  Follows with [facility] nephrology      Chronic prescription opiate use  - Continue home oxy 10 ER q12 hr  - Bowel regimen        Neuropathy  Jerking movements  OP notes indicate the jerking movements are not new. I have seen this with gabapentin in the past and her CrCl is quite poor. Takes 300 BID at home.  PLAN  - Reduced home gabapentin to 100 BID  - If pain not worse would recommend this reduced dose at discharge     Prophylaxis  -SCDs, ambulate BID    Diet  -Nutrition Therapy Regular/House; Fluid 2000 ml; Sodium Restricted (No Added Salt)  Supplement Adult; Nepro (Reduced Fluid/Lytes High Pro); # of Products PER Serving: 1 2xd PC    Code Status / HCDM   -Full Code  -  HCDM (patient stated preference): Barbara Huber,Barbara Huber - Daughter - [phone]    Anticipated Medically Ready for Discharge: Anticipated in 2-4 Days    Significant Comorbid Conditions:     -Chronic kidney disease POA requiring further investigation, treatment, or monitoring  -Age related debility POA requiring additional resources: DME, PT, or OT  -Hyponatremia POA requiring further investigation, treatment, or monitoring    Issues Impacting Complexity of Management:  -High risk of complications from pain and/or analgesia likely to result in delirium  -The patient is at high risk from Hospital immobility in an elderly patient given baseline poor functional status with a high risk of causing delirium and further decline in function  -Need for the following intensive monitoring parameter(s) due to high risk of clinical decline: continuous oxygen monitoring and telemetry    Medical Decision Making: Reviewed records from the following unique sources: [facility] cardiology, OP notes, discharge summary. Assessment required an independent historian, additional information obtained from family/friend, daughter bedside, due to patient's  fatigue, mild memory difficulties . Discussed the patient's management and/or test interpretation with ED Provider as summarized within this note    I personally spent greater than 90 minutes face-to-face and non-face-to-face in the care of this patient, which includes all pre, intra, and post visit time on the date of service.  All documented time was specific to the E/M visit and does not include any procedures that may have been performed.    Please Epic Chat me or page [facility] Admit [phone] with questions overnight. After 7am, please page the primary team.     Chauncy Lean, MD      HPI      Barbara Huber is a 82 y.o. female who is presenting to [facility] with Dyspnea on exertion, failure to thrive.     Daughter and patient seen together in ED. Daughter reports Mom was doing okay until she got steroids for arm/gout pain at the beginning of August. This seemed to give her a huge boost in energy/mood but caused her to gain a lot of water weight and she ended up in the hospital for a CHF exacerbation. She was diuresed at [facility] and eventually returned home. Daughter reports she never returned to her baseline. She continues to sleep a lot, poor appetite, more SOB/DOE than usual.     She saw her cardiologist on 8/30 and her weight was noted to have dropped another 3lbs since discharge (204lbs). They reduced her torsemide from 60 to 40 daily and advised her to eat/drink more. She continued to feel generally unwell, low, but did try to eat more. Weight up to 205lbs today on presentation. She is very DOE  but remains on home 3L oxygen. No new cough or infectious symptoms. Moving her bowels and denies constipation/diarrhea. Urinating without difficulty.     CXR with mild pulm edema. BNP is up to 450, has been as low as 100-200. Trop has been >100 since 8/21. She denies chest pain/angina. ED gave her 40 of lasix, which of course did nothing. Given it is very late, I favor waiting until the morning to challenge her with diuretics.     Med Rec Confidence   I reviewed the Medication List. The current list is Accurate    Physical Exam   Temp:  [36.6 ??C (97.8 ??F)] 36.6 ??C (97.8 ??F)  Heart Rate:  [66-84] 66  SpO2 Pulse:  [66-73] 67  Resp:  [13-22] 14  BP: (110-140)/(54-75) 129/75  SpO2:  [94 %-96 %] 96 %  Body mass index is 32.11 kg/m??.  GEN: Large woman resting in bed with head elevated, NAD  HEENT: White sclera, MMM, normal hearing  PULM: No respiratory distress, on 3L [place], lungs are clear with good air movement, no wheezing, no coughing during interview/exam  CARDS: Normal rate, well perfused  SKIN: Warm, dry  NEURO: Nonfocal other than peripheral neuropathy, A&Ox4 but either very tired or a little forgetful (daughter corrected her several times during history)  PSYCH: Calm, conversational

## 2023-08-31 NOTE — Unmapped (Signed)
Care Management  Initial Transition Planning Assessment       Type of Residence: Mailing Address:  [address] [place] [address]  Contacts: Accompanied by: Family member  Password: denies  Family Accommodations: bedside  Patient Phone Number: [phone] (home)           Medical Provider(s): Artelia Laroche, MD  Reason for Admission: Admitting Diagnosis:  Acute on chronic congestive heart failure, unspecified heart failure type (CMS-HCC) [I50.9]  Past Medical History:   has a past medical history of Acute kidney injury superimposed on chronic kidney disease (CMS-HCC) (10/11/2020), Acute on chronic diastolic (congestive) heart failure (CMS-HCC) (08/23/2020), Acute on chronic diastolic congestive heart failure (CMS-HCC), AKI (acute kidney injury) (CMS-HCC) (04/14/2015), Anemia (08/22/2019), Arthritis, Calculus of kidney, Calculus of ureter, CHF (congestive heart failure) (CMS-HCC), Chronic atrial fibrillation (CMS-HCC) (07/20/2019), COPD (chronic obstructive pulmonary disease) (CMS-HCC), Diabetes (CMS-HCC), Gangrenous cholecystitis (10/11/2020), Generalized edema  (06/17/2021), GERD (gastroesophageal reflux disease), Hydronephrosis, Hypertension, Hyponatremia (10/11/2020), Intermediate coronary syndrome (CMS-HCC) (03/13/2014), Lower extremity edema (09/28/2020), Lumbar stenosis, Microscopic hematuria, Nausea alone, Nephrolithiasis (04/17/2016), Neuropathy, Nocturia, Nocturia (07/01/2017), Other chronic cystitis, 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  Past Surgical History:   has a past surgical history that includes Carpal tunnel release (Left, 2014); Hysterectomy (1971); Back surgery (1995); Lumbar disc surgery; IR Insert Cholecystosmy Tube Percutaneous (10/02/2020); pr removal gallbladder (N/A, 10/06/2020); pr right heart cath o2 saturation & cardiac output (N/A, 09/30/2020); and pr right heart cath o2 saturation & cardiac output (N/A, 01/09/2021).   Previous admit date: 08/19/2023    Primary Insurance- Payor: HU[employer]NA MEDICARE ADV / Plan: HU[employer]NA GOLD PLUS HMO / Product Type: *No Product type* /   Secondary Insurance - None  Prescription Coverage -   Preferred Pharmacy - [facility] [place] [address]  [id number] - [place], [place] - [address] AT [facility]  [facility] - [place],  - [address]    Transportation home: Corporate investment banker assessed the patient by : Telephone conversation with family, Medical record review, Discussion with Clinical Care team  Orientation Level: Oriented X4  Functional level prior to admission: Independent  Reason for referral: Discharge Planning, Home Health    Contact/Decision Maker  Extended Emergency Contact Information  Primary Emergency Contact: [name],[name]  Address: [address]           [place], [place] [address] [place]  Home Phone: [phone]  Mobile Phone: [phone]  Relation: Daughter    Legal Next of Kin / Guardian / POA / Advance Directives     HCDM (patient stated preference): [name],[name] - Daughter - [phone]    Advance Directive (Medical Treatment)  Does patient have an advance directive covering medical treatment?: Patient has advance directive covering medical treatment, copy in chart.    Health Care Decision Maker [HCDM] (Medical & Mental Health Treatment)  Healthcare Decision Maker: HCDM documented in the HCDM/Contact Info section.  Information offered on HCDM, Medical & Mental Health advance directives:: Patient given information.    Advance Directive (Mental Health Treatment)  Does patient have an advance directive covering mental health treatment?: Patient has advance directive covering mental health treatment, copy in  chart.    Readmission Information    Have you been hospitalized in the last 30 days?: Yes  Name of Hospital: [facility]  Were you being cared for at a skilled nursing facility:: No     What day were you discharged from that hospital or facility?: 08/23/23  Number of Days between previous discharge and readmission date: 4-7 days    Type of Readmission: Related to Previous Admission    Readmission Source: Home       Did the following happen with your discharge?    Did you receive a follow-up/transition call?: Yes     Did you get your discharge medications?  : Yes       Did you go to your scheduled follow up appointment with your doctor on discharge?: No    If no why?: Readmitted prior to scheduled appointment  Which services or equipment arranged after your discharge arrived?: Home Health    Did you understand your discharge instructions?: Yes       Contributing Factors for Readmission: Complex medical history    Did you feel prepared for discharge?: Yes        Patient Information  Lives with: Children, Family members    Type of Residence: Private residence             Support Systems/Concerns: Children, Family Members    Responsibilities/Dependents at home?: No    Home Care services in place prior to admission?: Yes  Type of Home Care services in place prior to admission: Home OT, Home PT, Home nursing visits       Outpatient/Community Resources in place prior to admission: Clinic       Equipment Currently Used at Home: oxygen, respiratory supplies, other (see comments) ([employer]ator, [employer])  Current HME Agency (Name/Phone #): O2 provided by [employer] and [employer] by [facility] [facility]    Currently receiving outpatient dialysis?: No       Financial Information       Need for financial assistance?: No       Social Determinants of Health  Social Determinants of Health were addressed in provider documentation.  Please refer to patient history.  Social Determinants of Health     Financial Resource Strain: Low Risk  (08/20/2023)    Overall Financial Resource Strain (CARDIA)     Difficulty of Paying Living Expenses: Not hard at all   Internet Connectivity: Not on file   Food Insecurity: No Food Insecurity (08/20/2023)    Hunger Vital Sign     Worried About Running Out of Food in the Last Year: Never true     Ran Out of Food in the Last Year: Never true   Tobacco Use: Medium Risk (08/31/2023)    Patient History     Smoking Tobacco Use: Former     Smokeless Tobacco Use: Never     Passive Exposure: Not on file   Housing/Utilities: Low Risk  (08/20/2023)    Housing/Utilities     Within the past 12 months, have you ever stayed: outside, in a car, in a tent, in an overnight shelter, or temporarily in someone else's home (i.e. couch-surfing)?: No     Are you worried about losing your housing?: No     Within the past 12 months, have you been unable to get utilities (heat, electricity) when it was really needed?: No   Alcohol Use: Not At Risk (03/18/2023)    Alcohol Use     How often do you have a drink  containing alcohol?: Never     How many drinks containing alcohol do you have on a typical day when you are drinking?: 1 - 2     How often do you have 5 or more drinks on one occasion?: Never   Transportation Needs: No Transportation Needs (08/20/2023)    PRAPARE - Transportation     Lack of Transportation (Medical): No     Lack of Transportation (Non-Medical): No   Substance Use: Low Risk  (06/17/2023)    Substance Use     Taken prescription drugs for non-medical reasons: Never     Taken illegal drugs: Never     Patient indicated they have taken drugs in the past year for non-medical reasons: Yes, [positive answer(s)]: Not on file   Health Literacy: Medium Risk (05/02/2021)    Health Literacy     : Rarely   Physical Activity: Unknown (11/04/2017)    Received from [facility], [facility]    Exercise Vital Sign     Days of Exercise per Week: Patient declined     Minutes of Exercise per Session: Patient declined   Interpersonal Safety: Unknown (08/31/2023) Interpersonal Safety     Unsafe Where You Currently Live: Not on file     Physically Hurt by Anyone: Not on file     Abused by Anyone: Not on file   Stress: Unknown (11/04/2017)    Received from [facility], [facility]    [employer] of Occupational Health - Occupational Stress Questionnaire     Feeling of Stress : Patient declined   Intimate Partner Violence: Not At Risk (12/17/2021)    Humiliation, Afraid, Rape, and Kick questionnaire     Fear of Current or Ex-Partner: No     Emotionally Abused: No     Physically Abused: No     Sexually Abused: No   Depression: Not at risk (03/18/2023)    PHQ-2     PHQ-2 Score: 0   Social Connections: Unknown (11/04/2017)    Received from [facility], [facility]    Social Connection and Isolation Panel [NHANES]     Frequency of Communication with Friends and Family: Patient declined     Frequency of Social Gatherings with Friends and Family: Patient declined     Attends Religious Services: Patient declined     Database administrator or Organizations: Patient declined     Attends Engineer, structural: Patient declined     Marital Status: Patient declined       Complex Discharge Information    Is patient identified as a difficult/complex discharge?: No     Interventions:       Discharge Needs Assessment  Concerns to be Addressed: discharge planning    Clinical Risk Factors: > 65, Principal Diagnosis: Cancer, Stroke, COPD, Heart Failure, AMI, Pneumonia, Joint Replacment, Multiple Diagnoses (Chronic)    Barriers to taking medications: No    Prior overnight hospital stay or [name] in last 90 days: Yes              Anticipated Changes Related to Illness: none    Equipment Needed After Discharge: other (see comments) (TBD)    Discharge Facility/Level of Care Needs: other (see comments) (Home with [facility])    Readmission  Risk of Unplanned Readmission Score:  %  Predictive Model Details   No score data available for [facility] Risk of Unplanned Readmission  Readmitted Within the Last 30 Days? (No if blank) Yes  Patient at risk for readmission?: Yes    Discharge Plan  Screen findings are: Discharge planning needs identified or anticipated (Comment). (Home with [facility])    Expected Discharge Date: 09/02/2023    Expected Transfer from Critical Care:  (N/A)    Quality data for continuing care services shared with patient and/or representative?: Yes  Patient and/or family were provided with choice of facilities / services that are available and appropriate to meet post hospital care needs?: Yes   List choices in order highest to lowest preferred, if applicable. : [facility]    Initial Assessment complete?: Yes

## 2023-08-31 NOTE — Unmapped (Signed)
[facility]  Emergency Department Provider Note     ED Clinical Impression     Final diagnoses:   Acute on chronic congestive heart failure, unspecified heart failure type (CMS-HCC) (Primary)      HPI, Medical Decision Making, ED Course     HPI: 82 y.o. female who has a past medical history of [name], cardiac amyloidosis, pulmonary hypertension, chronic respiratory failure on 3 L, COPD, [name] stage IV, Afib (on amiodarone) and T2DM who presents with shortness of breath. The patient reports continued fatigue, weakness, shakiness and decreased p.o. intake in addition to increased shortness of breath in the setting of recent admission for acute CHF exacerbation. Per chart review, the patient was recently admitted 8/21-8/26/24 after she presented to the ED with fatigue, weakness and exertional dyspnea in the setting of recent prednisone course for gout. She had poor response to IV Lasix and was changed to IV Bumex 3mg  BID and metolazone. [name] was held for low BP. Cr increased to 4.0 with diuresis. She presented back to the ED on 08/21/23 for abdominal pain where she was found to have CHF exacerbation. She was discharged on torsemide 60mg  daily on 8/26. Then, she presented to her cardiologist on Friday (08/27/23) who decreased torsemide from 60 mg to 40 mg with instruction to add 20 mg extra if weight gets to 209lbs or above. She denies missing any diuretic doses. No increased edema. No known sick contacts. Denies fevers, abdominal pain, nausea, emesis, wheezing or chest pain.     Vitals are tachypneic to 22 respirations, otherwise within normal limits. Afebrile. On exam, the patient appears chronically-ill, in no acute distress. 1+ pitting edema. Rales at the bases bilaterally.     DDx/MDM:   This is an 82 year old female who presents to the emergency department with increased shortness of breath and decreased p.o. intake with failure to thrive.  Examination concerning for crackles to the bilateral bases, 1+ pitting edema; overall concern for possible CHF exacerbation.  No wheezing on examination, so low concern for COPD exacerbation.  Will obtain troponin and ECG to evaluate for cardiac ischemia and other causes, BNP to evaluate for CHF exacerbation, CBC to evaluate for anemia, electrolyte panel to evaluate for renal dysfunction or other electrolyte abnormalities, chest x-ray to evaluate for pneumonia or pneumothorax.  Considered pulmonary embolism; however, low concern in the setting of a more likely diagnosis of CHF exacerbation and in the absence of tachycardia or unilateral lower extremity edema.  Patient's weight was 204 on 8/30 at cardiology visit, 205 today.  Further diagnostic workup as below.    Orders Placed This Encounter   Procedures    RAPID INFLUENZA/RSV/COVID PCR    XR Chest 2 views    CBC w/ Differential    Comprehensive Metabolic Panel    PT-INR    hsTroponin I (serial 0-2-6H w/ delta)    hsTroponin I - 2 Hour    hsTroponin I - 6 Hour    Blood Gas, Venous    B-type Natriuretic Peptide    Special Airborne/Contact Isolation Status    ECG 12 Lead    ED Admit Decision       ED Course as of 08/30/23 2308   [facility] Aug 30, 2023   2033 hsTroponin I(!!): 164  Troponin elevation consistent with prior 11 days ago   2034 CMP shows creatinine elevation to 3.06, consistent with baseline.  Glucose elevated at 260, consistent with baseline.  Mild hyponatremia likely secondary to hyperglycemia.  Electrolytes otherwise within normal  limits.  LFTs within normal limits.   2034 CBC shows no left shift, no leukocytosis, no thrombocytopenia, no anemia.  Mild lymphopenia.   2035 XR Chest 2 views  Mild interstitial pulmonary edema.     2240 Negative flu/COVID/RSV.   2240 BNP(!): 450.68  Up from baseline in the 300s.  Will give 40 mg IV Lasix   2240 ECG 12 Lead  Atrial fibrillation at a rate of 81 bpm, no ST elevation, no ST depression, no new T wave inversions   2243 We have paged MAO for admission for CHF exacerbation.  Patient weight is 205 from 204 on 8/30.  Overall this is concerning in the setting of failure to thrive and decreased p.o. intake with CHF exacerbation.       MDM Elements  Independent Interpretation of Studies: I have independently interpreted the following studies:  ECG as above    Review of prior documentation: I have reviewed prior documentation including:  08/18/23 ED to [facility] Admission note to review course. 08/27/23 Cards office note.    Discussion of Management With Other Providers or Support Staff: I discussed the management of this patient with the:  MAO for admission    Considerations Regarding Additional Studies/Disposition/Escalation of Care and Critical Care:  Considered pulmonary embolism rule out; however, low risk as above  ____________________________________________    The case was discussed with the attending physician, who is in agreement with the above assessment and plan.      History     Outside Historian(s): I have obtained additional history/collateral from daughter at bedside.    Past Medical History:   Diagnosis Date    Acute kidney injury superimposed on chronic kidney disease (CMS-HCC) 10/11/2020    Acute on chronic diastolic (congestive) heart failure (CMS-HCC) 08/23/2020    Acute on chronic diastolic congestive heart failure (CMS-HCC)     AKI (acute kidney injury) (CMS-HCC) 04/14/2015    Lab Results  Component  Value  Date     CREATININE  1.90 (H)  06/12/2021     Had a bump in her creatinine when she was taking her diuretics every day.  She is currently taking 40 mg daily of torsemide and 50 mg of spironolactone.  Her volume status is fragile.  Previously when she stopped her diuretic she becomes short of breath.  Plan: We will check her BMP today.  We will likely have to go to 40    Anemia 08/22/2019    Iron deficiency anemia          Lab Results      Component    Value    Date           WBC    9.2    03/21/2021           RBC    3.67 (L)    03/21/2021           HGB    9.9 (L)    03/21/2021 HCT    30.7 (L)    03/21/2021           MCV    83.7    03/21/2021           MCH    26.9    03/21/2021           MCHC    32.1    03/21/2021           RDW    21.9 (H)    03/21/2021  PLT    318     Arthritis     Calculus of kidney     Calculus of ureter     CHF (congestive heart failure) (CMS-HCC)     Chronic atrial fibrillation (CMS-HCC) 07/20/2019    COPD (chronic obstructive pulmonary disease) (CMS-HCC)     Diabetes (CMS-HCC)     Gangrenous cholecystitis 10/11/2020    Generalized edema  06/17/2021    GERD (gastroesophageal reflux disease)     Hydronephrosis     Hypertension     Hyponatremia 10/11/2020    Intermediate coronary syndrome (CMS-HCC) 03/13/2014    Lower extremity edema 09/28/2020    Lumbar stenosis     Microscopic hematuria     Nausea alone     Nephrolithiasis 04/17/2016    Neuropathy     Nocturia     Nocturia 07/01/2017    Other chronic cystitis     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  See plans for anemia, AKI  We are also tapering her gabapentin.  Currently on 300 mg 3 times daily.  We will decrease it to twice daily, and then nightly.  She states she is not having any recurrence of her pain.    Pulmonary hypertension (CMS-HCC)     Renal colic     Shortness of breath 04/28/2020    Sleep apnea     Unstable angina pectoris (CMS-HCC) 03/13/2014       Past Surgical History:   Procedure Laterality Date    BACK SURGERY  1995    CARPAL TUNNEL RELEASE Left 2014    HYSTERECTOMY  1971    IR INSERT CHOLECYSTOSMY TUBE PERCUTANEOUS  10/02/2020    IR INSERT CHOLECYSTOSMY TUBE PERCUTANEOUS 10/02/2020 Braulio Conte, MD IMG VIR H&V [facility]    LUMBAR DISC SURGERY      PR REMOVAL GALLBLADDER N/A 10/06/2020    Procedure: CHOLECYSTECTOMY;  Surgeon: Katherina Mires, MD;  Location: MAIN OR [facility];  Service: Trauma    PR RIGHT HEART CATH O2 SATURATION & CARDIAC OUTPUT N/A 09/30/2020    Procedure: Right Heart Catheterization;  Surgeon: Neal Dy, MD; Location: [facility] CATH;  Service: Cardiology    PR RIGHT HEART CATH O2 SATURATION & CARDIAC OUTPUT N/A 01/09/2021    Procedure: Right Heart Catheterization;  Surgeon: Lesle Reek, MD;  Location: [facility] CATH;  Service: Cardiology         Current Facility-Administered Medications:     aspirin chewable tablet 324 mg, 324 mg, Oral, Once, Sherryl Barters, MD    furosemide (LASIX) injection 40 mg, 40 mg, Intravenous, Once, Lucylle Foulkes, Waylan Boga, MD    Current Outpatient Medications:     acetaminophen (TYLENOL) 500 MG tablet, Take 2 tablets (1,000 mg total) by mouth Three (3) times a day., Disp: , Rfl:     albuterol HFA 90 mcg/actuation inhaler, Inhale 2 puffs every eight (8) hours as needed for wheezing., Disp: , Rfl:     amiodarone (PACERONE) 200 MG tablet, TAKE ONE TABLET BY MOUTH ONCE DAILY, Disp: 90 tablet, Rfl: 3    colchicine (COLCRYS) 0.6 mg tablet, Take 0.3 mg (1/2 tablet) at the first sign of a gout flare. OK to repeat 0.3mg  (1/2 tablet) after 3 days if symptoms have not resolved, then every 3 days until flare resolves., Disp: 30 tablet, Rfl: 1    cranberry 500 mg cap, Take 500 mg by mouth daily with evening meal., Disp: , Rfl:  ergocalciferol-1,250 mcg, 50,000 unit, (DRISDOL) 1,250 mcg (50,000 unit) capsule, Take 1 capsule (1,250 mcg total) by mouth once a week., Disp: 12 capsule, Rfl: 3    estradioL (ESTRACE) 0.01 % (0.1 mg/gram) vaginal cream, Insert 2 g into the vagina Two (2) times a week., Disp: 42.5 g, Rfl: 3    ferrous sulfate 325 (65 FE) MG EC tablet, Take 1 tablet (325 mg total) by mouth Every Monday, Wednesday, and Friday., Disp: 36 tablet, Rfl: 3    fluticasone propion-salmeterol (ADVAIR HFA) 115-21 mcg/actuation inhaler, Inhale 2 puffs two (2) times a day., Disp: 12 g, Rfl: 11    gabapentin (NEURONTIN) 300 MG capsule, Take 1 capsule (300 mg total) by mouth two (2) times a day., Disp: 180 capsule, Rfl: 3    metOLazone (ZAROXOLYN) 5 MG tablet, Take 1 tablet (5 mg total) by mouth daily as needed (when instructed by cardiology clinic)., Disp: , Rfl:     montelukast (SINGULAIR) 10 mg tablet, Take 1 tablet (10 mg total) by mouth every morning., Disp: 90 tablet, Rfl: 2    NARCAN 4 mg/actuation nasal spray, 1 spray into alternating nostrils once as needed (opioid overdose). PRN - Emergency use., Disp: , Rfl:     NON FORMULARY, APPLY FROM NECK DOWN TWICE A DAY, Disp: , Rfl:     OXYCONTIN 10 mg TR12 12 hr crush resistant ER/CR tablet, Take 1 tablet (10 mg total) by mouth every twelve (12) hours., Disp: , Rfl:     OXYGEN-AIR DELIVERY SYSTEMS MISC, 5 L by Miscellaneous route. Currently using 3  L/min via [place], Disp: , Rfl:     pantoprazole (PROTONIX) 20 MG tablet, TAKE 1 TABLET(20 MG) BY MOUTH DAILY, Disp: 90 tablet, Rfl: 3    rosuvastatin (CRESTOR) 5 MG tablet, Take 1 tablet (5 mg total) by mouth every other day., Disp: 15 tablet, Rfl: 11    spironolactone (ALDACTONE) 25 MG tablet, TAKE 1 TABLET(25 MG) BY MOUTH DAILY, Disp: 90 tablet, Rfl: 3    tafamidis (VYNDAMAX) 61 mg cap, Take 1 capsule (61 mg) by mouth daily., Disp: 30 capsule, Rfl: 11    tiotropium bromide (SPIRIVA RESPIMAT) 2.5 mcg/actuation inhalation mist, Inhale 2 puffs daily., Disp: 4 g, Rfl: 11    torsemide (DEMADEX) 20 MG tablet, Take 3 tablets (60 mg total) by mouth daily., Disp: 270 tablet, Rfl: 3    vitamin A-3,000 mcg RAE, 10,000 UNIT, 3,000 mcg RAE (10,000 UNIT) capsule, Take 1 capsule (3,000 mcg of RAE total) by mouth daily., Disp: , Rfl:     vutrisiran (AMVUTTRA) 25 mg/0.5 mL injection, Inject 0.5 mL (25 mg total) under the skin once. Every 12 Weeks, Disp: , Rfl:     Allergies  Nitrofurantoin and Lipitor [atorvastatin]    Family History  Family History   Problem Relation Age of Onset    Hypertension Mother     Cancer Father         COLON CANCER    No Known Problems Sister     No Known Problems Sister     No Known Problems Brother     No Known Problems Brother     No Known Problems Maternal Aunt     No Known Problems Maternal Uncle     No Known Problems Paternal Aunt     No Known Problems Paternal Uncle     No Known Problems Maternal Grandmother     No Known Problems Maternal Grandfather     No Known Problems Paternal Grandmother  No Known Problems Paternal Grandfather     No Known Problems Other     Anesthesia problems Neg Hx     Broken bones Neg Hx     Clotting disorder Neg Hx     Collagen disease Neg Hx     Diabetes Neg Hx     Dislocations Neg Hx     Fibromyalgia Neg Hx     Gout Neg Hx     Hemophilia Neg Hx     Osteoporosis Neg Hx     Rheumatologic disease Neg Hx     Scoliosis Neg Hx     Severe sprains Neg Hx     Sickle cell anemia Neg Hx     Spinal Compression Fracture Neg Hx     GU problems Neg Hx     Kidney cancer Neg Hx     Prostate cancer Neg Hx        Social History  Social History     Tobacco Use    Smoking status: Former     Current packs/day: 0.00     Types: Cigarettes     Quit date: 2019     Years since quitting: 5.6    Smokeless tobacco: Never    Tobacco comments:     Quit a few years ago   Vaping Use    Vaping status: Never Used   Substance Use Topics    Alcohol use: No    Drug use: No        Physical Exam     VITAL SIGNS:      Vitals:    08/30/23 1834 08/30/23 2200   BP: 140/70 118/66   Pulse: 84 72   Resp: 22 17   Temp: 36.6 ??C (97.8 ??F)    TempSrc: Oral    SpO2: 94% 94%   Weight: 93 kg (205 lb)        Constitutional: Alert and oriented. Appears chronically-ill, in no acute distress.    Eyes: Conjunctivae are normal.  Neck: Supple, no meningismus.   HEENT: Normocephalic and atraumatic. Conjunctivae clear. No congestion. Moist mucous membranes.   Cardiovascular: Rate as above, regular rhythm. Normal and symmetric distal pulses. Brisk capillary refill. Normal skin turgor.  Respiratory: Normal respiratory effort.  Crackles at the bases bilaterally on auscultation.  Gastrointestinal: Soft, abdominal obesity, non-tender.  Genitourinary: Deferred.  Musculoskeletal: Non-tender with normal range of motion in all extremities. 1+ pitting edema.   Neurologic: Normal speech and language. No gross focal neurologic deficits are appreciated. Patient is moving all extremities equally, face is symmetric at rest and with speech.  Skin: Skin is warm, dry and intact. No rash noted.  Psychiatric: Mood and affect are normal. Speech and behavior are normal.     Radiology     XR Chest 2 views   Final Result      Mild interstitial pulmonary edema.           Laboratory Results     Lab Results   Component Value Date    WBC 9.0 08/30/2023    HGB 12.8 08/30/2023    HCT 39.1 08/30/2023    PLT 320 08/30/2023       Lab Results   Component Value Date    NA 134 (L) 08/30/2023    K 4.4 08/30/2023    CL 99 08/30/2023    CO2 24.3 08/30/2023    BUN 103 (H) 08/30/2023    CREATININE 3.06 (H) 08/30/2023    GLU 260 (H) 08/30/2023  CALCIUM 9.5 08/30/2023    MG 2.2 08/27/2023    PHOS 5.1 08/20/2023       Lab Results   Component Value Date    BILITOT 0.4 08/30/2023    BILIDIR 0.30 08/21/2023    PROT 7.7 08/30/2023    ALBUMIN 3.4 08/30/2023    ALT 15 08/30/2023    AST 31 08/30/2023    ALKPHOS 197 (H) 08/30/2023    GGT 215 (H) 09/29/2020       Lab Results   Component Value Date    INR 1.20 08/30/2023    APTT 46.9 (H) 01/10/2021       Pertinent labs & imaging results that were available during my care of the patient were independently interpreted by me and considered in my medical decision making (see chart for details).    Portions of this record have been created using Scientist, clinical (histocompatibility and immunogenetics). Dictation errors have been sought, but may not have been identified and corrected.    Documentation assistance was provided by Kela Millin, Scribe, on August 30, 2023 at 9:31 PM for Adora Fridge, MD.    Documentation assistance provided by the above mentioned scribe. I was present during the time the encounter was recorded. The information recorded by the scribe was done at my direction and has been reviewed and validated by me.          Peggyann Juba, MD  Resident  08/30/23 [phone]

## 2023-08-31 NOTE — Unmapped (Signed)
PHYSICAL THERAPY  Evaluation (08/31/23 1155)        Patient Name:  Barbara Huber       Medical Record Number: [id number]   Date of Birth: [birth year]  Sex: Female        Post-Discharge Physical Therapy Recommendations:  PT Post Acute Discharge Recommendations: Skilled PT services indicated, 3x weekly   Equipment Recommendation  PT DME Recommendations: None          Treatment Diagnosis: Difficulty in walking  Treatment Diagnosis: decreased endurance     Activity Tolerance: Tolerated treatment well     ASSESSMENT  Problem List: Fall risk, Decreased mobility, Shortness of breath, Decreased endurance      Assessment : [name] is a 82 y.o. 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    Pt presents to PT with decreased endurance and activity tolerance which has improved since admission.  Pt able to ambulate for 50'x2 with rollator starting at 3L however needed up to 6L to maintain SpO2 >/=88% during ambulation.  Pt has support at home from family and does not leave the house except for medical appointments.  Pt functioning below baseline at this time and would  benefit from acute PT services to address objective findings.  Recommend post acute services 3x/week.     Today's Interventions: PT Eval, transfers, ambulation, endurance training, ed re: PT role, POC, safety precautions, progressive OOB mobility     Personal Factors/Comorbidities Present: 3+   Examination of Body System: Musculoskeletal, Cardiovascular, Pulmonary, Activity/participation  Clinical Presentation: Evolving    Clinical Decision Making: Moderate        PLAN  Planned Frequency of Treatment: Plan of Care Initiated: 08/31/23  1-2x per day for: 3-4x week        Planned Interventions: Education (Patient/Family/Caregiver), Self-care / Home Management training, Gait training, Therapeutic Exercise, Therapeutic Activity     Goals:   Patient and Family Goals: To feel better and return home     SHORT GOAL #1: Pt will perform bed mobility with Mod-I               Time Frame : 2 weeks  SHORT GOAL #2: Pt will perform sit<>stand Supervision with LRAD              Time Frame : 2 weeks  SHORT GOAL #3: Pt will ambulate 70ft Supervision with LRAD              Time Frame : 2 weeks          Prognosis:  Good  Positive Indicators: motivation, home support  Barriers to Discharge: Endurance deficits     SUBJECTIVE  Communication Preference: Verbal,     Patient reports: Agreeable to PT, has been up several times this morning to use the bathroom     Prior Functional Status: mod I with use of rollator for household distances, uses manual w/c in community, only leaves the house to go to doctor appts. daughter works first shift and SIL works 3rd shift (SIL sleeping during the day), left superior and inferior pubic rami fractures after injury/fall on 02/15/2023, no falls since, wears 3L at home all the time  Equipment available at home: Rollator, Oxygen        Past Medical History:   Diagnosis Date    Acute kidney injury superimposed on chronic kidney disease (CMS-HCC)  10/11/2020    Acute on chronic diastolic (congestive) heart failure (CMS-HCC) 08/23/2020    Acute on chronic diastolic congestive heart failure (CMS-HCC)     AKI (acute kidney injury) (CMS-HCC) 04/14/2015    Lab Results  Component  Value  Date     CREATININE  1.90 (H)  06/12/2021     Had a bump in her creatinine when she was taking her diuretics every day.  She is currently taking 40 mg daily of torsemide and 50 mg of spironolactone.  Her volume status is fragile.  Previously when she stopped her diuretic she becomes short of breath.  Plan: We will check her BMP today.  We will likely have to go to 40    Anemia 08/22/2019    Iron deficiency anemia          Lab Results      Component    Value    Date           WBC    9.2    03/21/2021 RBC    3.67 (L)    03/21/2021           HGB    9.9 (L)    03/21/2021           HCT    30.7 (L)    03/21/2021           MCV    83.7    03/21/2021           MCH    26.9    03/21/2021           MCHC    32.1    03/21/2021           RDW    21.9 (H)    03/21/2021           PLT    318     Arthritis     Calculus of kidney     Calculus of ureter     CHF (congestive heart failure) (CMS-HCC)     Chronic atrial fibrillation (CMS-HCC) 07/20/2019    COPD (chronic obstructive pulmonary disease) (CMS-HCC)     Diabetes (CMS-HCC)     Gangrenous cholecystitis 10/11/2020    Generalized edema  06/17/2021    GERD (gastroesophageal reflux disease)     Hydronephrosis     Hypertension     Hyponatremia 10/11/2020    Intermediate coronary syndrome (CMS-HCC) 03/13/2014    Lower extremity edema 09/28/2020    Lumbar stenosis     Microscopic hematuria     Nausea alone     Nephrolithiasis 04/17/2016    Neuropathy     Nocturia     Nocturia 07/01/2017    Other chronic cystitis     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  See plans for anemia, AKI  We are also tapering her gabapentin.  Currently on 300 mg 3 times daily.  We will decrease it to twice daily, and then nightly.  She states she is not having any recurrence of her pain.    Pulmonary hypertension (CMS-HCC)     Renal colic     Shortness of breath 04/28/2020    Sleep apnea     Unstable angina pectoris (CMS-HCC) 03/13/2014            Social History     Tobacco Use    Smoking status: Former     Current packs/day: 0.00     Types: Cigarettes  Quit date: 2019     Years since quitting: 5.6    Smokeless tobacco: Never    Tobacco comments:     Quit a few years ago   Substance Use Topics    Alcohol use: No       Past Surgical History:   Procedure Laterality Date    BACK SURGERY  1995    CARPAL TUNNEL RELEASE Left 2014    HYSTERECTOMY  1971    IR INSERT CHOLECYSTOSMY TUBE PERCUTANEOUS  10/02/2020    IR INSERT CHOLECYSTOSMY TUBE PERCUTANEOUS 10/02/2020 Braulio Conte, MD IMG VIR H[facility]    LUMBAR DISC SURGERY      PR REMOVAL GALLBLADDER N/A 10/06/2020    Procedure: CHOLECYSTECTOMY;  Surgeon: Katherina Mires, MD;  Location: MAIN [facility];  Service: Trauma    PR RIGHT HEART CATH O2 SATURATION & CARDIAC OUTPUT N/A 09/30/2020    Procedure: Right Heart Catheterization;  Surgeon: Neal Dy, MD;  Location: [facility] CATH;  Service: Cardiology    PR RIGHT HEART CATH O2 SATURATION & CARDIAC OUTPUT N/A 01/09/2021    Procedure: Right Heart Catheterization;  Surgeon: Lesle Reek, MD;  Location: [facility] CATH;  Service: Cardiology             Family History   Problem Relation Age of Onset    Hypertension Mother     Cancer Father         COLON CANCER    No Known Problems Sister     No Known Problems Sister     No Known Problems Brother     No Known Problems Brother     No Known Problems Maternal Aunt     No Known Problems Maternal Uncle     No Known Problems Paternal Aunt     No Known Problems Paternal Uncle     No Known Problems Maternal Grandmother     No Known Problems Maternal Grandfather     No Known Problems Paternal Grandmother     No Known Problems Paternal Grandfather     No Known Problems Other     Anesthesia problems Neg Hx     Broken bones Neg Hx     Clotting disorder Neg Hx     Collagen disease Neg Hx     Diabetes Neg Hx     Dislocations Neg Hx     Fibromyalgia Neg Hx     Gout Neg Hx     Hemophilia Neg Hx     Osteoporosis Neg Hx     Rheumatologic disease Neg Hx     Scoliosis Neg Hx     Severe sprains Neg Hx     Sickle cell anemia Neg Hx     Spinal Compression Fracture Neg Hx     GU problems Neg Hx     Kidney cancer Neg Hx     Prostate cancer Neg Hx         Allergies: Nitrofurantoin and Lipitor [atorvastatin]          Objective Findings  Precautions / Restrictions  Precautions: Falls precautions  Weight Bearing Status: Non-applicable  Required Braces or Orthoses: Non-applicable     Pain Comments: no c/o at this time  Medical Tests / Procedures: reviewed in chart  Equipment / Environment: Vascular access (PIV, TLC, Port-a-cath, PICC), Supplemental oxygen     Vitals/Orthostatics : SpO2 93% at rest on 3L, titrated up to 6L during ambulation to maintain SpO2 >/=88%; returned to 3L with SpO2  91% at end of session     Living Situation  Living Environment: House  Lives With: Daughter, Family  Home Living: One level home, Walk-in shower, Handicapped height toilet, Grab bars in shower, Built-in shower seat, Shower chair with back, Ramped entrance  Caregiver Identified?: Yes  Caregiver Availability: 24 hours  Caregiver Ability: Limited lifting      Cognition: WFL  Cognition comment: pleasant and cooperative  Visual/Perception: Wears Glasses/Contacts  Hearing: No deficit identified     Skin Inspection: Intact where visualized     Upper Extremities  UE ROM: Right WFL, Left WFL  UE Strength: Right WFL, Left WFL    Lower Extremities  LE ROM: Right WFL, Left WFL  LE Strength: Right WFL, Left WFL  LE comment: no focal deficits        Coordination: WFL  Proprioception: Not tested  Sensation: Not tested  Posture: Rounded shoulders    Static Sitting-Level of Assistance: Independent  Dynamic Sitting-Level of Assistance: Archivist Standing-Level of Assistance: Supervision  Dynamic Standing - Level of Assistance: Stand by assistance  Standing Balance comments: Rollator support      Bed Mobility comments: NT, pt up to recliner at beginnng and end of session     Transfers: Sit to Stand  Sit to Stand assistance level: Standby assist, set-up cues, supervision of patient - no hands on  Transfer comments: from recliner, rising on first trial with good body mechanics using a rollator for standing support      Gait Level of Assistance: Standby assist, set-up cues, supervision of patient - no hands on  Gait Assistive Device: Four wheel walker  Gait Distance Ambulated (ft): 50 ft (x2)  Skilled Treatment Performed: slow steady gait with one standing rest break, leaning on forearms for standing rest break      Endurance: limited at baseline, household ambulator    Patient at end of session: All needs in reach, In chair, Staff present    Physical Therapy Session Duration  PT Individual [mins]: 26          AM-PAC-6 click  Help currently need turning over In bed?: None - Modified Independent/Independent  Help currently needed sitting down/standing up from chair with arms? : A Little - Minimal/Contact Guard Assist/Supervision  Help currently needed moving from supine to sitting on edge of bed?: A Little - Minimal/Contact Guard Assist/Supervision  Help currently needed moving to and from bed from wheelchair?: A Little - Minimal/Contact Guard Assist/Supervision  Help currently needed walking in a hospital room?: A Little - Minimal/Contact Guard Assist/Supervision      16/20    6 click Score (in points): % of Functional Impairment, Limitation, Restriction  6: 100% impaired, limited, restricted  7-8: At least 80%, but less than 100% impaired, limited restricted  9-13: At least 60%, but less than 80% impaired, limited restricted  14-19: At least 40%, but less than 60% impaired, limited restricted  20-22: At least 20%, but less than 40% impaired, limited restricted  23: At least 1%, but less than 20% impaired, limited restricted  24: 0% impaired, limited restricted      I attest that I have reviewed the above information.  Signed: Anders Grant, PT  Filed 08/31/2023

## 2023-08-31 NOTE — Unmapped (Signed)
Home health has been set up for through the agency listed below. The Home health agency will be contacting you to set up a time for them to come see you in your home within 2 days of your discharge.  If you have not heard from them prior to 09/02/23 or you have any questions about home health, please contact them at the phone number listed below.    [facility] - Admitted Since 08/30/2023       Service Provider Selected Services Address Phone Fax Patient Preferred    [facility] [place] [facility] [address], [place] [address] [phone] --

## 2023-09-01 LAB — MAGNESIUM: MAGNESIUM: 2.3 mg/dL (ref 1.6–2.6)

## 2023-09-01 LAB — CBC
HEMATOCRIT: 36.6 % (ref 34.0–44.0)
HEMOGLOBIN: 11.8 g/dL (ref 11.3–14.9)
MEAN CORPUSCULAR HEMOGLOBIN CONC: 32.2 g/dL (ref 32.0–36.0)
MEAN CORPUSCULAR HEMOGLOBIN: 29.7 pg (ref 25.9–32.4)
MEAN CORPUSCULAR VOLUME: 92.2 fL (ref 77.6–95.7)
MEAN PLATELET VOLUME: 8.5 fL (ref 6.8–10.7)
PLATELET COUNT: 289 10*9/L (ref 150–450)
RED BLOOD CELL COUNT: 3.97 10*12/L (ref 3.95–5.13)
RED CELL DISTRIBUTION WIDTH: 14.8 % (ref 12.2–15.2)
WBC ADJUSTED: 9.6 10*9/L (ref 3.6–11.2)

## 2023-09-01 LAB — BASIC METABOLIC PANEL
ANION GAP: 8 mmol/L (ref 5–14)
BLOOD UREA NITROGEN: 97 mg/dL — ABNORMAL HIGH (ref 9–23)
BUN / CREAT RATIO: 34
CALCIUM: 9.5 mg/dL (ref 8.7–10.4)
CHLORIDE: 101 mmol/L (ref 98–107)
CO2: 28.8 mmol/L (ref 20.0–31.0)
CREATININE: 2.87 mg/dL — ABNORMAL HIGH
EGFR CKD-EPI (2021) FEMALE: 16 mL/min/{1.73_m2} — ABNORMAL LOW (ref >=60)
GLUCOSE RANDOM: 132 mg/dL (ref 70–179)
POTASSIUM: 4.5 mmol/L (ref 3.4–4.8)
SODIUM: 138 mmol/L (ref 135–145)

## 2023-09-01 MED ADMIN — tafamidis cap 61 mg **Patient Supplied**: 61 mg | ORAL | @ 01:00:00

## 2023-09-01 MED ADMIN — bumetanide (BUMEX) injection 4 mg: 4 mg | INTRAVENOUS | @ 14:00:00 | Stop: 2023-09-01

## 2023-09-01 MED ADMIN — oxyCODONE (OxyCONTIN) 12 hr crush resistant ER/CR tablet 10 mg: 10 mg | ORAL | @ 14:00:00 | Stop: 2023-09-14

## 2023-09-01 MED ADMIN — bumetanide (BUMEX) injection 4 mg: 4 mg | INTRAVENOUS | @ 21:00:00 | Stop: 2023-09-01

## 2023-09-01 MED ADMIN — acetaminophen (TYLENOL) tablet 650 mg: 650 mg | ORAL | @ 13:00:00

## 2023-09-01 MED ADMIN — oxyCODONE (OxyCONTIN) 12 hr crush resistant ER/CR tablet 10 mg: 10 mg | ORAL | @ 01:00:00 | Stop: 2023-09-14

## 2023-09-01 MED ADMIN — ferrous sulfate tablet 325 mg: 325 mg | ORAL | @ 14:00:00

## 2023-09-01 MED ADMIN — insulin glargine (LANTUS) injection 4 Units: 4 [IU] | SUBCUTANEOUS | @ 01:00:00

## 2023-09-01 MED ADMIN — melatonin tablet 3 mg: 3 mg | ORAL | @ 01:00:00

## 2023-09-01 MED ADMIN — polyethylene glycol (MIRALAX) packet 17 g: 17 g | ORAL | @ 14:00:00

## 2023-09-01 MED ADMIN — pantoprazole (Protonix) EC tablet 20 mg: 20 mg | ORAL | @ 14:00:00

## 2023-09-01 MED ADMIN — umeclidinium (INCRUSE ELLIPTA) 62.5 mcg/actuation inhaler 1 puff: 1 | RESPIRATORY_TRACT | @ 12:00:00

## 2023-09-01 MED ADMIN — fluticasone furoate-vilanterol (BREO ELLIPTA) 100-25 mcg/dose inhaler 1 puff: 1 | RESPIRATORY_TRACT | @ 12:00:00

## 2023-09-01 MED ADMIN — senna (SENOKOT) tablet 2 tablet: 2 | ORAL | @ 01:00:00

## 2023-09-01 MED ADMIN — gabapentin (NEURONTIN) capsule 100 mg: 100 mg | ORAL | @ 14:00:00

## 2023-09-01 MED ADMIN — montelukast (SINGULAIR) tablet 10 mg: 10 mg | ORAL | @ 14:00:00

## 2023-09-01 MED ADMIN — amiodarone (PACERONE) tablet 200 mg: 200 mg | ORAL | @ 14:00:00

## 2023-09-01 MED ADMIN — gabapentin (NEURONTIN) capsule 100 mg: 100 mg | ORAL | @ 01:00:00

## 2023-09-01 MED ADMIN — insulin lispro (HumaLOG) injection 0-20 Units: 0-20 [IU] | SUBCUTANEOUS | @ 16:00:00

## 2023-09-01 MED ADMIN — tafamidis cap 61 mg **Patient Supplied**: 61 mg | ORAL | @ 15:00:00

## 2023-09-01 NOTE — Unmapped (Signed)
A&Ox4. VSS. Pain managed well with PRN tylenol. No complaints of nausea. Ambulating with walker. Purewick remains in place during shift. Adequate urine output. Family at bedside during part of shift. Awaiting family to bring medication from home for pharmacy to verify. All safety measures remain in place. Call bell in reach. Will continue plan of care.   Problem: Adult Inpatient Plan of Care  Goal: Plan of Care Review  Outcome: Progressing  Goal: Patient-Specific Goal (Individualized)  Outcome: Progressing  Goal: Absence of Hospital-Acquired Illness or Injury  Outcome: Progressing  Intervention: Identify and Manage Fall Risk  Recent Flowsheet Documentation  Taken 08/31/2023 1800 by Charlann Lange, RN  Safety Interventions:   fall reduction program maintained   lighting adjusted for tasks/safety   low bed  Taken 08/31/2023 1600 by Charlann Lange, RN  Safety Interventions:   fall reduction program maintained   family at bedside   lighting adjusted for tasks/safety   low bed  Taken 08/31/2023 1400 by Charlann Lange, RN  Safety Interventions:   fall reduction program maintained   family at bedside   lighting adjusted for tasks/safety   low bed  Taken 08/31/2023 1200 by Charlann Lange, RN  Safety Interventions:   fall reduction program maintained   family at bedside   lighting adjusted for tasks/safety   low bed  Intervention: Prevent Skin Injury  Recent Flowsheet Documentation  Taken 08/31/2023 1800 by Charlann Lange, RN  Positioning for Skin: Supine/Back  Taken 08/31/2023 1600 by Charlann Lange, RN  Positioning for Skin: Sitting in Chair  Taken 08/31/2023 1400 by Charlann Lange, RN  Positioning for Skin: Supine/Back  Taken 08/31/2023 1200 by Charlann Lange, RN  Positioning for Skin: Supine/Back  Intervention: Prevent and Manage VTE (Venous Thromboembolism) Risk  Recent Flowsheet Documentation  Taken 08/31/2023 1800 by Marylou Mccoy A, RN  Anti-Embolism Device Type: SCD, Knee  Anti-Embolism Intervention: On  Anti-Embolism Device Location: BLE  Taken 08/31/2023 1600 by Marylou Mccoy A, RN  Anti-Embolism Device Type: SCD, Knee  Anti-Embolism Intervention: On  Anti-Embolism Device Location: BLE  Taken 08/31/2023 1400 by Marylou Mccoy A, RN  Anti-Embolism Device Type: SCD, Knee  Anti-Embolism Intervention: On  Anti-Embolism Device Location: BLE  Taken 08/31/2023 1200 by Marylou Mccoy A, RN  Anti-Embolism Device Type: SCD, Knee  Anti-Embolism Intervention: On  Anti-Embolism Device Location: BLE  Goal: Optimal Comfort and Wellbeing  Outcome: Progressing  Goal: Readiness for Transition of Care  Outcome: Progressing  Goal: Rounds/Family Conference  Outcome: Progressing     Problem: Fall Injury Risk  Goal: Absence of Fall and Fall-Related Injury  Outcome: Progressing  Intervention: Promote Injury-Free Environment  Recent Flowsheet Documentation  Taken 08/31/2023 1800 by Charlann Lange, RN  Safety Interventions:   fall reduction program maintained   lighting adjusted for tasks/safety   low bed  Taken 08/31/2023 1600 by Charlann Lange, RN  Safety Interventions:   fall reduction program maintained   family at bedside   lighting adjusted for tasks/safety   low bed  Taken 08/31/2023 1400 by Charlann Lange, RN  Safety Interventions:   fall reduction program maintained   family at bedside   lighting adjusted for tasks/safety   low bed  Taken 08/31/2023 1200 by Charlann Lange, RN  Safety Interventions:   fall reduction program maintained   family at bedside   lighting adjusted for tasks/safety   low bed     Problem: Self-Care Deficit  Goal: Improved Ability to  Complete Activities of Daily Living  Outcome: Progressing     Problem: Heart Failure  Goal: Optimal Coping  Outcome: Progressing  Goal: Optimal Cardiac Output  Outcome: Progressing  Goal: Stable Heart Rate and Rhythm  Outcome: Progressing  Goal: Optimal Functional Ability  Outcome: Progressing  Goal: Fluid and Electrolyte Balance  Outcome: Progressing  Goal: Improved Oral Intake  Outcome: Progressing  Goal: Effective Oxygenation and Ventilation  Outcome: Progressing  Goal: Effective Breathing Pattern During Sleep  Outcome: Progressing

## 2023-09-01 NOTE — Unmapped (Signed)
Problem: Adult Inpatient Plan of Care  Goal: Plan of Care Review  Outcome: Progressing  Flowsheets (Taken 09/01/2023 1642)  Progress: improving  Plan of Care Reviewed With: patient  Goal: Patient-Specific Goal (Individualized)  Outcome: Progressing  Goal: Absence of Hospital-Acquired Illness or Injury  Outcome: Progressing  Intervention: Identify and Manage Fall Risk  Recent Flowsheet Documentation  Taken 09/01/2023 1600 by Freida Busman, RN  Safety Interventions:   fall reduction program maintained   low bed  Taken 09/01/2023 1400 by Freida Busman, RN  Safety Interventions:   fall reduction program maintained   low bed  Taken 09/01/2023 1000 by Freida Busman, RN  Safety Interventions:   fall reduction program maintained   low bed  Taken 09/01/2023 0800 by Freida Busman, RN  Safety Interventions:   fall reduction program maintained   low bed  Intervention: Prevent Skin Injury  Recent Flowsheet Documentation  Taken 09/01/2023 1600 by Freida Busman, RN  Positioning for Skin: Sitting in Chair  Taken 09/01/2023 1400 by Freida Busman, RN  Positioning for Skin: Sitting in Chair  Taken 09/01/2023 1000 by Freida Busman, RN  Positioning for Skin: Sitting in Chair  Taken 09/01/2023 0800 by Freida Busman, RN  Positioning for Skin: Sitting in Chair  Skin Protection: incontinence pads utilized  Intervention: Prevent and Manage VTE (Venous Thromboembolism) Risk  Recent Flowsheet Documentation  Taken 09/01/2023 1600 by Freida Busman, RN  Anti-Embolism Intervention: Refused  Taken 09/01/2023 1400 by Freida Busman, RN  Anti-Embolism Intervention: Refused  Taken 09/01/2023 1000 by Freida Busman, RN  Anti-Embolism Intervention: Refused  Taken 09/01/2023 0907 by Freida Busman, RN  Anti-Embolism Intervention: Refused  Taken 09/01/2023 0800 by Freida Busman, RN  Anti-Embolism Intervention: Refused  Goal: Optimal Comfort and Wellbeing  Outcome: Progressing  Goal: Readiness for Transition of Care  Outcome: Progressing  Goal: Rounds/Family Conference  Outcome: Progressing     Problem: Fall Injury Risk  Goal: Absence of Fall and Fall-Related Injury  Outcome: Progressing  Intervention: Promote Scientist, clinical (histocompatibility and immunogenetics) Documentation  Taken 09/01/2023 1600 by Freida Busman, RN  Safety Interventions:   fall reduction program maintained   low bed  Taken 09/01/2023 1400 by Freida Busman, RN  Safety Interventions:   fall reduction program maintained   low bed  Taken 09/01/2023 1000 by Freida Busman, RN  Safety Interventions:   fall reduction program maintained   low bed  Taken 09/01/2023 0800 by Freida Busman, RN  Safety Interventions:   fall reduction program maintained   low bed     Problem: Self-Care Deficit  Goal: Improved Ability to Complete Activities of Daily Living  Outcome: Progressing     Problem: Heart Failure  Goal: Optimal Coping  Outcome: Progressing  Goal: Optimal Cardiac Output  Outcome: Progressing  Goal: Stable Heart Rate and Rhythm  Outcome: Progressing  Goal: Optimal Functional Ability  Outcome: Progressing  Intervention: Optimize Functional Ability  Recent Flowsheet Documentation  Taken 09/01/2023 1600 by Freida Busman, RN  Activity Management: up in chair  Taken 09/01/2023 1400 by Freida Busman, RN  Activity Management: up in chair  Taken 09/01/2023 1000 by Freida Busman, RN  Activity Management: up in chair  Taken 09/01/2023 0800 by Freida Busman, RN  Activity Management: up in chair  Goal: Fluid and Electrolyte Balance  Outcome: Progressing  Goal: Improved Oral Intake  Outcome: Progressing  Goal: Effective Oxygenation and Ventilation  Outcome: Progressing  Intervention: Promote Airway Secretion Clearance  Recent Flowsheet Documentation  Taken 09/01/2023 1600 by Freida Busman, RN  Activity Management: up in chair  Taken 09/01/2023 1400 by Freida Busman, RN  Activity Management: up in chair  Taken 09/01/2023 1000 by Freida Busman, RN  Activity Management: up in chair  Taken 09/01/2023 0800 by Freida Busman, RN  Activity Management: up in chair  Goal: Effective Breathing Pattern During Sleep  Outcome: Progressing       Pt tolerating diet well.  Pt voiding spontaneously in BR and has missed collection hat a few times today. Pt OBB with staff. Pt reports acute pain managed well with PRN pain med. Pt diuresed with Bumex today. Pt does use 3 liters of oxygen at baseline and does requires more oxygen when ambulating.

## 2023-09-01 NOTE — Unmapped (Signed)
Daily Progress Note    Assessment/Plan:    Principal Problem:    Dyspnea on exertion  Active Problems:    COPD (chronic obstructive pulmonary disease) (CMS-HCC)    Sleep apnea in adult    Essential hypertension    Type 2 diabetes mellitus with stage 4 chronic kidney disease, with long-term current use of insulin (CMS-HCC)    Chronic respiratory failure with hypoxia (CMS-HCC)    Persistent atrial fibrillation (CMS-HCC)    Anemia in chronic kidney disease    Chronic kidney disease ([name]), stage IV (severe) (CMS-HCC)    Pulmonary hypertension (CMS-HCC)    Chronic prescription opiate use    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)    Cardiac amyloidosis (CMS-HCC)    Neuropathy    Hereditary amyloidosis (CMS-HCC)    Acute exacerbation of CHF (congestive heart failure) (CMS-HCC)    Troponin level elevated   Malnutrition Evaluation as performed by RD, LDN: Patient does not meet AND/ASPEN criteria for malnutrition at this time (08/31/23 1517)             Barbara Huber is a 82 y.o. female with cardiac amyloidosis who is presenting to [facility] with Dyspnea on exertion, in the setting of the following pertinent/contributing co-morbidities: [name], pulm hypertension, and recent admission for CHF exacerbation, [name].     Dyspnea on exertion, history of heart failure with preserved EF with acute exacerbation, cardiac amyloidosis (TTR)-BNP elevated in the 400s and mild pulmonary edema on chest x-ray likely caused by acute exacerbation of congestive heart failure.  Weight was down on admission but the patient had had poor intake likely due to gut edema over the past few weeks which may have caused weight loss despite fluid retention.  Her dyspnea has improved with diuresis since admission.  -Continue diuresis, increase Bumex from 3 mg to 4 mg twice daily to assess response  -Strict I's and O's as tolerated  -Holding home torsemide and spironolactone while diuresing with IV meds  -Continue to watch electrolytes while diuresis  -Continue to monitor weight  -Discussed considering CT scan if DOE is not resolving with diuresis but currently appears less likely    Poor oral intake malaise and fatigue-poor oral intake is likely due to gut edema leading to poor intake as daughter notes that this often occurs with a heart failure exacerbation.  Intake has improved throughout admission.  Cortisol checked and appropriate.  TSH consistent with nonthyroidal illness.  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  - Would likely benefit from pulm or cardiac rehab     Type 2 diabetes mellitus-hemoglobin A1c 5.8.  Will continue glargine here given reports of hyperglycemia when held. Glucose >250 in ED  - On lantus 4 unit qhs at bedtime  - SSI TIDAC     Persistent atrial fibrillation   - On amiodarone 200 mg every day  - Off apixaban due to c/f bleeding/anemia in the past        Anemia in chronic kidney disease, AoCD. Hgb normal in 11-12 range for >1 year now.  - Empiric nutritional support, iron 3x weekly     Chronic kidney disease, stage IVCr usually in 2.5-3.5 range. Home regimen: Torsemide just reduced from 60 to 40 mg daily, spironolactone. Follows with [facility] nephrology  -  Closely monitor creatinine with diuresis.  Is due for nephrology follow-up.      Chronic prescription opiate use  - Continue home oxy 10 ER q12 hr  - Bowel regimen        Neuropathy  Jerking movements  - Reduced home gabapentin to 100 BID ith [name]      Daily Checklist:  Diet: Regular diet, 2 L fluid restriction  DVT PPx: SCDs, not on chronic anticoagulation due to past histories of bleeding  Code Status: Full Code  Dispo: Continue floor status, expected discharge with home health, and soon as 9-5    Pixie Casino, MD   Pager [phone]  ___________________________________________________________________    Subjective:  No acute events overnight.  She reports that she is already feeling significantly better today and having less dyspnea with exertion.  Low urine output recorded but discussed with bedside nurse if she reported that 2 of her 3 voids missed the hat and so were not recorded.  She is sitting up in the chair for most of the day.  She reports that her appetite is better and she is able to eat 50% of her lunch.  She has not had vomiting.  She is inquiring if she will be able to leave the hospital tomorrow.  She had 1 bowel movement.    Labs/Studies:  Labs and Studies from the last 24hrs per EMR and Reviewed    Objective:  Temp:  [36.4 ??C (97.5 ??F)-36.8 ??C (98.2 ??F)] 36.7 ??C (98.1 ??F)  Heart Rate:  [60-70] 67  SpO2 Pulse:  [93] (P) 93  Resp:  [18] 18  BP: (116-130)/(58-64) 116/59  SpO2:  [83 %-97 %] 97 %    GEN: NAD, sitting in the chair by the window  EYES: EOMI, no icterus  ENT: MMM  CV: RRR, no murmurs heard  PULM: Faint bibasilar crackles, normal work of breathing on nasal cannula, no accessory muscle use  ABD: soft, less abdominal distention, no tenderness to palpation  EXT: Pitting edema around her back, 1+ edema around the ankles otherwise no lower extremity edema  Psych: Alert, oriented, able to appropriately answer questions

## 2023-09-02 LAB — BASIC METABOLIC PANEL
ANION GAP: 12 mmol/L (ref 5–14)
BLOOD UREA NITROGEN: 89 mg/dL — ABNORMAL HIGH (ref 9–23)
BUN / CREAT RATIO: 28
CALCIUM: 9.7 mg/dL (ref 8.7–10.4)
CHLORIDE: 101 mmol/L (ref 98–107)
CO2: 26.1 mmol/L (ref 20.0–31.0)
CREATININE: 3.2 mg/dL — ABNORMAL HIGH
EGFR CKD-EPI (2021) FEMALE: 14 mL/min/{1.73_m2} — ABNORMAL LOW (ref >=60)
GLUCOSE RANDOM: 125 mg/dL (ref 70–179)
POTASSIUM: 4.4 mmol/L (ref 3.4–4.8)
SODIUM: 139 mmol/L (ref 135–145)

## 2023-09-02 LAB — MAGNESIUM: MAGNESIUM: 2.1 mg/dL (ref 1.6–2.6)

## 2023-09-02 MED ORDER — GABAPENTIN 100 MG CAPSULE
ORAL_CAPSULE | Freq: Two times a day (BID) | ORAL | 0 refills | 30 days | Status: CP
Start: 2023-09-02 — End: 2023-10-02
  Filled 2023-09-02: qty 60, 30d supply, fill #0

## 2023-09-02 MED ADMIN — fluticasone furoate-vilanterol (BREO ELLIPTA) 100-25 mcg/dose inhaler 1 puff: 1 | RESPIRATORY_TRACT | @ 14:00:00 | Stop: 2023-09-02

## 2023-09-02 MED ADMIN — amiodarone (PACERONE) tablet 200 mg: 200 mg | ORAL | @ 15:00:00 | Stop: 2023-09-02

## 2023-09-02 MED ADMIN — insulin lispro (HumaLOG) injection 0-20 Units: 0-20 [IU] | SUBCUTANEOUS | @ 16:00:00 | Stop: 2023-09-02

## 2023-09-02 MED ADMIN — gabapentin (NEURONTIN) capsule 100 mg: 100 mg | ORAL | @ 01:00:00

## 2023-09-02 MED ADMIN — pantoprazole (Protonix) EC tablet 20 mg: 20 mg | ORAL | @ 15:00:00 | Stop: 2023-09-02

## 2023-09-02 MED ADMIN — tafamidis cap 61 mg **Patient Supplied**: 61 mg | ORAL | @ 15:00:00 | Stop: 2023-09-02

## 2023-09-02 MED ADMIN — acetaminophen (TYLENOL) tablet 650 mg: 650 mg | ORAL | @ 01:00:00

## 2023-09-02 MED ADMIN — gabapentin (NEURONTIN) capsule 100 mg: 100 mg | ORAL | @ 15:00:00 | Stop: 2023-09-02

## 2023-09-02 MED ADMIN — acetaminophen (TYLENOL) tablet 650 mg: 650 mg | ORAL | @ 15:00:00 | Stop: 2023-09-02

## 2023-09-02 MED ADMIN — oxyCODONE (OxyCONTIN) 12 hr crush resistant ER/CR tablet 10 mg: 10 mg | ORAL | @ 01:00:00 | Stop: 2023-09-14

## 2023-09-02 MED ADMIN — umeclidinium (INCRUSE ELLIPTA) 62.5 mcg/actuation inhaler 1 puff: 1 | RESPIRATORY_TRACT | @ 14:00:00 | Stop: 2023-09-02

## 2023-09-02 MED ADMIN — polyethylene glycol (MIRALAX) packet 17 g: 17 g | ORAL | @ 15:00:00 | Stop: 2023-09-02

## 2023-09-02 MED ADMIN — montelukast (SINGULAIR) tablet 10 mg: 10 mg | ORAL | @ 15:00:00 | Stop: 2023-09-02

## 2023-09-02 MED ADMIN — insulin glargine (LANTUS) injection 4 Units: 4 [IU] | SUBCUTANEOUS | @ 01:00:00

## 2023-09-02 MED ADMIN — oxyCODONE (OxyCONTIN) 12 hr crush resistant ER/CR tablet 10 mg: 10 mg | ORAL | @ 15:00:00 | Stop: 2023-09-02

## 2023-09-02 NOTE — Unmapped (Signed)
A & O x 4. For dyspnea. Amyloidosis history. On telemetry with O2 monitor. 2000 liter fluid restriction. BIPAP for sleep. SCDs in place.

## 2023-09-03 DIAGNOSIS — Z09 Encounter for follow-up examination after completed treatment for conditions other than malignant neoplasm: Principal | ICD-10-CM

## 2023-09-03 NOTE — Unmapped (Signed)
Physician Discharge Summary HBR  3 BT1 HBR  [address]  [place] [address]  Dept: [phone]  Loc: [phone]     Identifying Information:   Barbara Huber  [birth year]  [id number]    Primary Care Physician: Artelia Laroche, MD   Code Status: Full Code    Admit Date: 08/30/2023    Discharge Date: 09/02/2023     Discharge To: Home with Home Health and/or PT/OT    Discharge Service: HBR - HBB: Hospitalist Service #1     Discharge Attending Physician: No att. providers found    Discharge Diagnoses:  Principal Problem:    Dyspnea on exertion (POA: Yes)  Active Problems:    COPD (chronic obstructive pulmonary disease) (CMS-HCC) (POA: Yes)    Sleep apnea in adult (POA: Yes)    Essential hypertension (POA: Yes)    Type 2 diabetes mellitus with stage 4 chronic kidney disease, with long-term current use of insulin (CMS-HCC) (POA: Not Applicable)    Chronic respiratory failure with hypoxia (CMS-HCC) (POA: Yes)    Persistent atrial fibrillation (CMS-HCC) (POA: Yes)    Anemia in chronic kidney disease (POA: Yes)    Chronic kidney disease ([name]), stage IV (severe) (CMS-HCC) (POA: Yes)    Pulmonary hypertension (CMS-HCC) (POA: Yes)    Chronic prescription opiate use (POA: Not Applicable)    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) (POA: Yes)    Cardiac amyloidosis (CMS-HCC) (POA: Yes)    Neuropathy (POA: Yes)    Hereditary amyloidosis (CMS-HCC) (POA: Yes)    Acute exacerbation of CHF (congestive heart failure) (CMS-HCC) (POA: Yes)    Troponin level elevated (POA: Yes)  Resolved Problems:    * No resolved hospital problems. *      Outpatient Provider Follow Up Issues:   [ ]  Monitor Cr at follow-up   [ ] Adjust diuretics based on symptoms     Hospital Course:   Barbara Huber is a 82 y.o. female with cardiac amyloidosis who is presenting to [facility] with Dyspnea on exertion, in the setting of the following pertinent/contributing co-morbidities: [name], pulm hypertension, and recent admission for CHF exacerbation, [name].      Dyspnea on exertion, history of heart failure with preserved EF with acute exacerbation, cardiac amyloidosis (TTR)-the patient received steroids for episode of gout at the beginning of August and then had significant increase in water weight and was hospitalized for a heart failure exacerbation through August 26.  However, never returned to baseline and had worsening breathing after this, BNP elevated in the 400s and mild pulmonary edema on chest x-ray likely caused by acute exacerbation of congestive heart failure.  Weight was down on admission but the patient had had poor intake likely due to gut edema over the past few weeks which may have caused weight loss despite fluid retention.  Her dyspnea improved with diuresis since admission.  She received 3 mg of IV Bumex and then increased to 4 with improvement of DOE.  She will discharge on home torsemide. Weight on discharge 204.Had mild Cr bump. Held diuretics for a day and instructed patient to follow-up with PCP to ensure stable .      Poor oral intake malaise and fatigue-poor oral intake is likely due to gut edema leading to poor intake as daughter notes that this often occurs with a heart failure exacerbation.  Intake has improved throughout admission.  Cortisol checked and appropriate.  TSH consistent with nonthyroidal illness.  She was seen by PT and OT and  recommended for home health.  She will continue home health on discharge.     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  There was no signs of ACS or admission.  Troponin was flat.     COPD ,Chronic respiratory failure with hypoxia, Sleep apnea in adult- Stable on 3L O2 at home with negative RPP.  Initially required more oxygen with exertion but improved with diuresis.  No signs of COPD exacerbation.      Persistent atrial fibrillation-is on chronic amiodarone 200 mg daily for atrial fibrillation.  She is no longer on apixaban due to to bleeding episodes and anemia in the past.  Considered that DOE could be secondary to amiodarone toxicity but since improved with diuresis felt less likely.    Anemia in chronic kidney disease, AoCD. Hgb normal in 11-12 range for >1 year now.  Continues on oral iron.     Chronic kidney disease, stage IVCr usually in 2.5-3.5 range. Home regimen: Torsemide just reduced from 60 to 40 mg daily, spironolactone. Follows with [facility] nephrology.  Creatinine closely monitored with diuresis and remained stable although had mildly elevated Cr after more aggressive diuresis to 3.2 on the day of discharge. Suggested the patient to follow-up with nephrology since has not been seem since Oct 2023.     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    Procedures:  No admission procedures for hospital encounter.  ______________________________________________________________________  Discharge Medications:     Your Medication List        STOP taking these medications      spironolactone 25 MG tablet  Commonly known as: ALDACTONE            CHANGE how you take these medications      gabapentin 100 MG capsule  Commonly known as: NEURONTIN  Take 1 capsule (100 mg total) by mouth two (2) times a day.  What changed:   medication strength  how much to take  when to take this            CONTINUE taking these medications      acetaminophen 500 MG tablet  Commonly known as: TYLENOL  Take 2 tablets (1,000 mg total) by mouth Three (3) times a day.     albuterol 90 mcg/actuation inhaler  Commonly known as: PROVENTIL HFA;VENTOLIN HFA  Inhale 2 puffs every eight (8) hours as needed for wheezing.     amiodarone 200 MG tablet  Commonly known as: PACERONE  TAKE ONE TABLET BY MOUTH ONCE DAILY     AMVUTTRA 25 mg/0.5 mL injection  Generic drug: vutrisiran  Inject 0.5 mL (25 mg total) under the skin once. Every 12 Weeks  Notes to patient: Resume home regimen     colchicine 0.6 mg tablet  Commonly known as: COLCRYS  Take 0.3 mg (1/2 tablet) at the first sign of a gout flare. OK to repeat 0.3mg  (1/2 tablet) after 3 days if symptoms have not resolved, then every 3 days until flare resolves.  Notes to patient: Resume home regimen     cranberry 500 mg Cap  Take 500 mg by mouth daily with evening meal.  Notes to patient: Resume home regimen     ergocalciferol-1,250 mcg (50,000 unit) 1,250 mcg (50,000 unit) capsule  Commonly known as: DRISDOL  Take 1 capsule (1,250 mcg total) by mouth once a  week.  Notes to patient: Resume home regimen     estradiol 0.01 % (0.1 mg/gram) vaginal cream  Commonly known as: ESTRACE  Insert 2 g into the vagina Two (2) times a week.  Notes to patient: Resume home regimen     ferrous sulfate 325 (65 FE) MG EC tablet  Take 1 tablet (325 mg total) by mouth Every Monday, Wednesday, and Friday.  Notes to patient: Resume home regimen     fluticasone propion-salmeterol 115-21 mcg/actuation inhaler  Commonly known as: ADVAIR HFA  Inhale 2 puffs two (2) times a day.  Notes to patient: Resume home regimen     metOLazone 5 MG tablet  Commonly known as: ZAROXOLYN  Take 1 tablet (5 mg total) by mouth daily as needed (when instructed by cardiology clinic).  Notes to patient: Resume home regimen     montelukast 10 mg tablet  Commonly known as: SINGULAIR  Take 1 tablet (10 mg total) by mouth every morning.     NARCAN 4 mg/actuation nasal spray  Generic drug: naloxone  1 spray into alternating nostrils once as needed (opioid overdose). PRN - Emergency use.  Notes to patient: Resume home regimen     NON FORMULARY  APPLY FROM NECK DOWN TWICE A DAY     OxyCONTIN 10 mg Tr12 12 hr crush resistant ER/CR tablet  Generic drug: oxyCODONE  Take 1 tablet (10 mg total) by mouth every twelve (12) hours.     OXYGEN-AIR DELIVERY SYSTEMS MISC  5 L by Miscellaneous route. Currently using 3  L/min via [place] pantoprazole 20 MG tablet  Commonly known as: Protonix  TAKE 1 TABLET(20 MG) BY MOUTH DAILY     rosuvastatin 5 MG tablet  Commonly known as: CRESTOR  Take 1 tablet (5 mg total) by mouth every other day.  Notes to patient: Resume home regimen     SPIRIVA RESPIMAT 2.5 mcg/actuation inhalation mist  Generic drug: tiotropium bromide  Inhale 2 puffs daily.  Notes to patient: Resume home regimen     torsemide 20 MG tablet  Commonly known as: DEMADEX  Take 3 tablets (60 mg total) by mouth daily.  Notes to patient: Resume home regimen     vitamin A-3,000 mcg RAE (10,000 UNIT) 3,000 mcg RAE (10,000 UNIT) capsule  Take 1 capsule (3,000 mcg of RAE total) by mouth daily.  Notes to patient: Resume home regimen     VYNDAMAX 61 mg Cap  Generic drug: tafamidis  Take 1 capsule (61 mg) by mouth daily.              Allergies:  Nitrofurantoin and Lipitor [atorvastatin]  ______________________________________________________________________  Pending Test Results (if blank, then none):      Most Recent Labs:  All lab results last 24 hours -   Recent Results (from the past 24 hour(s))   POCT Glucose    Collection Time: 09/01/23  8:40 PM   Result Value Ref Range    Glucose, POC 190 (H) 70 - 179 mg/dL   Basic Metabolic Panel    Collection Time: 09/02/23  6:15 AM   Result Value Ref Range    Sodium 139 135 - 145 mmol/L    Potassium 4.4 3.4 - 4.8 mmol/L    Chloride 101 98 - 107 mmol/L    CO2 26.1 20.0 - 31.0 mmol/L    Anion Gap 12 5 - 14 mmol/L    BUN 89 (H) 9 - 23 mg/dL    Creatinine 2.95 (H) 0.55 - 1.02  mg/dL    BUN/Creatinine Ratio 28     eGFR [name]-EPI (2021) Female 14 (L) >=60 mL/min/1.67m2    Glucose 125 70 - 179 mg/dL    Calcium 9.7 8.7 - 28.4 mg/dL   Magnesium Level    Collection Time: 09/02/23  6:15 AM   Result Value Ref Range    Magnesium 2.1 1.6 - 2.6 mg/dL   POCT Glucose    Collection Time: 09/02/23  7:50 AM   Result Value Ref Range    Glucose, POC 153 70 - 179 mg/dL   POCT Glucose    Collection Time: 09/02/23 11:31 AM   Result Value Ref Range    Glucose, POC 251 (H) 70 - 179 mg/dL       Relevant Studies/Radiology (if blank, then none):  ECG 12 Lead    Result Date: 08/31/2023  ATRIAL FIBRILLATION ANTEROLATERAL INFARCT  (CITED ON OR BEFORE 18-Aug-2023) ABNORMAL ECG WHEN COMPARED WITH ECG OF 18-Aug-2023 23:59, NO SIGNIFICANT CHANGE SINCE LAST TRACING Confirmed by Warnell Forester ([id number]) on 08/31/2023 3:40:34 PM    XR Chest 2 views    Result Date: 08/30/2023  EXAM: XR CHEST 2 VIEWS ACCESSION: [id number] UN CLINICAL INDICATION: CHEST PAIN ; Chest Pain  TECHNIQUE: PA and Lateral Chest Radiographs. COMPARISON: Chest radiograph 08/21/2023 FINDINGS: Hazy opacification of the bilateral lower lungs. Similar prominence of the pulmonary vasculature. No pleural effusion or pneumothorax. Unchanged cardiomediastinal silhouette.     Mild interstitial pulmonary edema.   ______________________________________________________________________  Discharge Instructions:           Other Instructions       Discharge instructions      You were hospitalized for extra fluid that was causing shortness of breath when walking.  I am glad that you are feeling better from a fluid perspective.  You can start taking your torsemide tomorrow.  Hold your spironolactone until you follow-up with Dr. Excell Seltzer.  Please weigh yourself first thing in the morning and record your weight to be followed closely.  Please return for medical evaluation if you develop severe worsening of your breathing, chest pain, or other concerning symptoms.  I have decreased your gabapentin dose to 100 mg as with your kidney function 300 mg can sometimes cause side effects.  I do suggest that you call your kidney doctor to follow-up with them and try to get plugged in again.  Thanks for letting me be part of your care team.  It was great to get to know you.  I am wishing you all the best. -Dr. Candis Schatz            Follow Up instructions and Outpatient Referrals     Discharge instructions      Ambulatory Referral to Home Health      Reason for referral: Home health    Physician to follow patient's care: Referring Provider    Disciplines requested:  Nursing  Physical Therapy  Occupational Therapy       Nursing requested: Other: (please enter in comments) Comment - CHF Med   management    Physical Therapy requested: Evaluate and treat    Occupational Therapy Requested: Evaluate and treat        Appointments which have been scheduled for you      Sep 06, 2023 1:00 PM  (Arrive by 12:45 PM)  DIURESIS HF [place] with DIURESIS HF NP [facility]  [facility] CARDIOLOGY [place]) [address]  [facility] 1 through 4  [place] [address]  [phone]        Sep 22, 2023 3:45 PM  (Arrive by 3:30 PM)  RETURN VIDEO MYCHART with Artelia Laroche, MD  [facility] INTERNAL MEDICINE [place] [facility] [place]) [address]  [facility] 1 through 4  [place] [address]  [phone]   Please sign into My [place] Chart at least 15 minutes before your appointment to complete the eCheck-In process. You must complete eCheck-In before you can start your video visit. We also recommend testing your audio and video connection to troubleshoot any issues before your visit begins. Click ???Join Video Visit??? to complete these checks. Once you have completed eCheck-In and tested your audio and video, click ???Join Call??? to connect to your visit.     For your video visit, you will need a computer with a working camera, speaker and microphone, a smartphone, or a tablet with internet access.    My [place] Chart enables you to manage your health, send non-urgent messages to your provider, view your test results, schedule and manage appointments, and request prescription refills securely and conveniently from your computer or mobile device.    You can go to [email or web address] to sign in to your My [place] Chart account with your username and password. If you have forgotten your username or password, please choose the ???Forgot Username???? and/or ???Forgot Password???? links to gain access. You also can access your My [place] Chart account with the free MyChart mobile app for Android or iPhone.    If you need assistance accessing your My [place] Chart account or for assistance in reaching your provider's office to reschedule or cancel your appointment, please call [facility] [phone].         Sep 23, 2023 3:00 PM  (Arrive by 2:45 PM)  RETURN NEPHROLOGY with Abhijit Lonia Blood, MD  [facility] [place] [facility]) [address]  [facility] 1 through 4  [place] [address]  [phone]        Oct 06, 2023 1:30 PM  (Arrive by 1:15 PM)  RETURN HEART FAILURE  with Montez Hageman, AGNP  [facility] CARDIOLOGY [place] [facility] [place]) [address]  [facility] 1 through 4  [place] [address]  [phone]   Wear comfortable walking shoes in the event that a 6-minute walk test must be performed         Oct 06, 2023 2:45 PM  (Arrive by 2:30 PM)  INFUSION ONLY with UNCTIF 10  [facility] THERAPEUTIC INFUSION CTR [place] [facility] [place]) [address]  [facility] 1 through 4  [place] [address]  [phone]             ______________________________________________________________________  Discharge Day Services:  BP 129/78  - Pulse 65  - Temp 36.7 ??C (98 ??F) (Temporal)  - Resp 18  - Ht 170.2 cm (5' 7)  - Wt 92.6 kg (204 lb 1.6 oz)  - SpO2 90%  - BMI 31.97 kg/m??   Pt seen on the day of discharge and determined appropriate for discharge.    Condition at Discharge: stable    Length of Discharge: I spent greater than 30 mins in the discharge of this patient.

## 2023-09-06 ENCOUNTER — Ambulatory Visit: Admit: 2023-09-06 | Discharge: 2023-09-07 | Payer: MEDICARE | Attending: Adult Health | Primary: Adult Health

## 2023-09-06 DIAGNOSIS — I5032 Chronic diastolic (congestive) heart failure: Principal | ICD-10-CM

## 2023-09-06 DIAGNOSIS — I4819 Other persistent atrial fibrillation: Principal | ICD-10-CM

## 2023-09-06 LAB — BASIC METABOLIC PANEL
ANION GAP: 10 mmol/L (ref 5–14)
BLOOD UREA NITROGEN: 84 mg/dL — ABNORMAL HIGH (ref 9–23)
BUN / CREAT RATIO: 32
CALCIUM: 10 mg/dL (ref 8.7–10.4)
CHLORIDE: 100 mmol/L (ref 98–107)
CO2: 25.9 mmol/L (ref 20.0–31.0)
CREATININE: 2.61 mg/dL — ABNORMAL HIGH
EGFR CKD-EPI (2021) FEMALE: 18 mL/min/{1.73_m2} — ABNORMAL LOW (ref >=60)
GLUCOSE RANDOM: 248 mg/dL — ABNORMAL HIGH (ref 70–179)
POTASSIUM: 4.3 mmol/L (ref 3.4–4.8)
SODIUM: 136 mmol/L (ref 135–145)

## 2023-09-06 LAB — PRO-BNP: PRO-BNP: 4575 pg/mL — ABNORMAL HIGH (ref <=300.0)

## 2023-09-06 LAB — MAGNESIUM: MAGNESIUM: 2.3 mg/dL (ref 1.6–2.6)

## 2023-09-06 NOTE — Unmapped (Signed)
[facility] Note    Referring Provider: Artelia Laroche, MD  [phone] [facility] [address] [place],  [place] [address]   Primary Provider: Artelia Laroche, MD  [address]  [place] [address]   Other Providers:  Dr Barbara Huber    Reason for Visit:  Barbara Huber is a 82 y.o. female being seen for routine visit and continued care of cardiac amyloidosis .    Assessment & Plan:  1. 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 SPEP/UPEP/free light chains without concern. 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 Barbara Huber is informed and has seen cardiology Barbara Huber).   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 2nd injection July 2024. Continue vitamin A supplementation.   - DC'd Jardiance 10mg  daily due to recurrent UTIs  - Weight below baseline today at 201 lb (home scale) - possibly in setting of weight loss from poor appetite over recent weeks/months.   - Continue torsemide 60mg  daily  - Continue Tafamidis 61mg  daily   - Continue spironolactone 25mg  daily   - She is not interested in CardioMEMS at this time.     2. Persistent atrial fibrillation  - CHA2DS2-VASc score = 5 (1-gender, 2-Age, 1-DM, 1-HTN)  - We stopped Eliquis given recurrent anemia, shared decision making w/ pt and Barbara Huber. She is not interested in [place] (or any procedures) at this time  - She had been in afib since at least 07/2020; s/p cardioversion 01/10/21   - On amiodarone 200mg  daily - increased it back herself after seeing no improvement in tremors/jerking with decreased dose  - amiodarone monitoring - last PFTs in 04/2021. TSH/LFTs stable at last check   - appears to have had recurrent AF during admission (EKG from 9/2) - possibly contributing to recent admission presentation, but thankfully back in SR today by EKG    3. [name]  - Cr staying around 2.3-3.0; 2.61 today  - following with nephrology    4. COPD  - home O2 3-4L [place] - followed by pulm  - on Advair and Spiriva    5. Iron deficiency anemia  - Finished IV Iron - ferritin 261. No longer anemic.  Lab Results   Component Value Date    FERRITIN 336.3 (H) 08/19/2023     6. Anxiety  - previously on Zoloft  - Having trouble sleeping - consider remeron, will discuss with PCP    7. DM  - reports elevated fasting sugars, discuss w/ PCP  Lab Results   Component Value Date    A1C 5.8 (H) 08/30/2023       Follow-up:  Return in about 2 days (around 09/08/2023) for phone call.     History of Present Illness:  Barbara Huber is a 82 y.o. female with PMHx of COPD on 3L home [place], CHF, [name], [name], [name] She was initially admitted with decompensated [name] in 2021 in setting of  Septic Shock d/t Gangrenous Cholecystitis s/p cholecystectomy. She underwent further evaluation with PYP scan which showed TTR amyloid. She has since been followed  in clinic for management of her [name]. In 2022, had numerous admissions for weakness/urosepsis, shortness of breath/orthopnea/weight gain, lightheadedness/SOB/Blurry vision found to be anemic.    Summary of most recent visits:  - 01/16/22 [facility]): Feeling well from a cardiac standpoint. Started Zoloft for anxiety  - 02/13/22 [facility]): Weight 210-211. Feeling well. Taking extra torsemide 20mg  about every other day, which was stopped for elevated Cr.   - 03/13/22 ([name]): Weight 201-216, took metolazone PRN, weight stabilized at 210lbs. Considering cardioMEMS.   - 05/13/22 ([name]): Had 3 iron infusions. Doing well w/ volume, weight 211-212lbs. Cooking, staying active around house.   - 05/21/22 ([name]): CP/SOB. Weight 217 from 213. She took metolazone x1 dose.   - 05/22/22 ([name]): Ed follow up. Back down to 213lbs. Still having CP w/ certain movement.   - 06/05/22 ([name]): Weight 214-216lb, should be 212lb.   - 06/09/22, 06/11/22, 06/22/22 ([name]): Weight down from 217 to 213lb, 211lb at home. Feeling well by 6/26.   - 07/15/22 ([name]): Switched inhaler back to Advair/Spiriva with improvement in breathing and O2 sat. Weight 207lbs, feeling more like herself.   - 08/19/22 ([name]): Feeling well. Weight 208-210lbs.   - 10/22/22 ([name]): Feeling well. Weight has been 210-214lbs.   - 12/04/22 ([name]): doing well overall, weight a bit up so taking extra torsemide.   - 02/10/23 ([name]): Weight 213-214lbs, feeling well, eating a lot. Had taken metolazone earlier in the week.   - 02/23/23 [facility]): Hospital follow up after mechanical fall with nondisplaced fractures of the left superior and inferior pubic rami. Taking torsemide 40mg , weight 211.   - 04/14/23 ([name]): Weight 210-212. 1st Amvuttra  - 07/14/23 [facility]): Weight 211-212 on torsemide 60mg  daily. 2nd Amvuttra.   - 08/18/23-08/23/23 (Admission): fatigue, weakness, DOE, recent prednisone course for gout. Admitted to [facility], poor response to IV Lasix and was changed to IV Bumex 3mg  BID and metolazone. Barbara Huber held for low BP. Cr increased to 4.0 with diuresis. She went back to the ER on 8/24 for abdominal pain. She was felt to be euvolemic at a weight of 207lb and IV diuretics were held. She was discharged on torsemide 60mg  daily on 8/26.   - 08/27/23 [facility]): hospital follow up. Felt to be dry for weight down to 204 at home, Cr 3.4 BUN 96. Changed torsemide from 60 daily to 40/20.   - 08/30/23-09/02/23 (Admission):    Interval history  Today Barbara Huber presents to the clinic for hospital follow up. Unfortunately, she was re-admitted  9/2-9/5 for fatigue and SOB. She was diuresed  with improvement in dyspnea. Weight on discharge was 204. Her weight continues to decrease on her scale by about 1lb per day. She was 201 this morning. She is a little lightehaded. She feels weak. She gets SOB if she moves too much. She is eating better in the morning than in the afternoon; appetite overall is still not good.   She is taking torsemide 60mg  a day with good urine output; she is still taking spiro    Cardiovascular History & Procedures:    Cath / PCI:  none    CV Surgery:   none    EP Procedures and Devices:  none    Non-Invasive Evaluation(s):    Echo:  08/19/23  1. The left ventricle is normal in size with moderately increased wall  thickness.    2. 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    3. The left ventricular  systolic function is hyperdynamic, LVEF is visually  estimated at >70%.    4. Mitral annular calcification is present (moderate).    5. The mitral valve leaflets are mildly thickened with normal leaflet  mobility.    6. The aortic valve is trileaflet with mildly thickened leaflets with normal  excursion.    7. The left atrium is moderately dilated in size.    8. The right ventricle is normal in size, with normal systolic function.    02/17/23    1. The left ventricle is normal in size with moderately increased wall  thickness.    2. The left ventricular systolic function is hyperdynamic, LVEF is visually  estimated at >70%.    3. The left atrium is moderately dilated in size.    4. The right ventricle is normal in size, with normal systolic function.    5. There is mild pulmonary hypertension.    12/09/21  Summary    1. The left ventricle is normal in size with severely increased wall  thickness.    2. The left ventricular systolic function is hyperdynamic, LVEF is visually  estimated at >70%.    3. There is grade III diastolic dysfunction (severely elevated filling  pressure).    4. The left atrium is moderately to severely dilated in size.    5. The right ventricle is mildly dilated in size, with normal systolic  function.    6. The right atrium is mildly dilated  in size.    7. IVC size and inspiratory change suggest elevated right atrial pressure.  (10-20 mmHg).    04/24/21   1. The left ventricle is normal in size with mildly increased wall  thickness.    2. The left ventricular systolic function is normal, LVEF is visually  estimated at > 55%.    3. There is grade II diastolic dysfunction (elevated filling pressure).    4. Mitral annular calcification is present (mild).    5. The left atrium is mildly dilated in size.    6. The right ventricle is mildly dilated in size, with low normal systolic  function.    7. IVC size and inspiratory change suggest elevated right atrial pressure.  (10-20 mmHg).    09/30/20  Summary    1. Limited study to assess systolic function.    2. IVC size and inspiratory change suggest normal right atrial pressure.  (0-5 mmHg).    3. The left ventricle is normal in size with mildly to moderately increased  wall thickness.    4. The left ventricular systolic function is normal with no obvious wall  motion abnormalities, LVEF is visually estimated at > 55%.    5. Mitral annular calcification is present.    6. The left atrium is mildly dilated in size.    7. The right ventricle is upper normal in size, with reduced systolic  Function.    Cardiac CT/MRI/Nuclear Tests:   None    6 Minute Walk:  None    Cardiopulmonary Stress Tests:   None               Other Past Medical History:  See below for the complete EPIC list of past medical and surgical history.      Allergies:  Nitrofurantoin and Lipitor [atorvastatin]    Current Medications:  Current Outpatient Medications   Medication Sig Dispense Refill    acetaminophen (TYLENOL) 500 MG tablet Take 2 tablets (1,000 mg total) by mouth Three (3) times  a day.      albuterol HFA 90 mcg/actuation inhaler Inhale 2 puffs every eight (8) hours as needed for wheezing.      amiodarone (PACERONE) 200 MG tablet TAKE ONE TABLET BY MOUTH ONCE DAILY 90 tablet 3    colchicine (COLCRYS) 0.6 mg tablet Take 0.3 mg (1/2 tablet) at the first sign of a gout flare. OK to repeat 0.3mg  (1/2 tablet) after 3 days if symptoms have not resolved, then every 3 days until flare resolves. 30 tablet 1    cranberry 500 mg cap Take 500 mg by mouth daily with evening meal.      ergocalciferol-1,250 mcg, 50,000 unit, (DRISDOL) 1,250 mcg (50,000 unit) capsule Take 1 capsule (1,250 mcg total) by mouth once a week. 12 capsule 3    estradioL (ESTRACE) 0.01 % (0.1 mg/gram) vaginal cream Insert 2 g into the vagina Two (2) times a week. 42.5 g 3    ferrous sulfate 325 (65 FE) MG EC tablet Take 1 tablet (325 mg total) by mouth Every Monday, Wednesday, and Friday. 36 tablet 3    fluticasone propion-salmeterol (ADVAIR HFA) 115-21 mcg/actuation inhaler Inhale 2 puffs two (2) times a day. 12 g 11    gabapentin (NEURONTIN) 100 MG capsule Take 1 capsule (100 mg total) by mouth two (2) times a day. 60 capsule 0    metOLazone (ZAROXOLYN) 5 MG tablet Take 1 tablet (5 mg total) by mouth daily as needed (when instructed by cardiology clinic).      montelukast (SINGULAIR) 10 mg tablet Take 1 tablet (10 mg total) by mouth every morning. 90 tablet 2    NARCAN 4 mg/actuation nasal spray 1 spray into alternating nostrils once as needed (opioid overdose). PRN - Emergency use.      OXYCONTIN 10 mg TR12 12 hr crush resistant ER/CR tablet Take 1 tablet (10 mg total) by mouth every twelve (12) hours.      OXYGEN-AIR DELIVERY SYSTEMS MISC 5 L by Miscellaneous route. Currently using 3  L/min via [place]      pantoprazole (PROTONIX) 20 MG tablet TAKE 1 TABLET(20 MG) BY MOUTH DAILY 90 tablet 3    rosuvastatin (CRESTOR) 5 MG tablet Take 1 tablet (5 mg total) by mouth every other day. 15 tablet 11    tafamidis (VYNDAMAX) 61 mg cap Take 1 capsule (61 mg) by mouth daily. 30 capsule 11    tiotropium bromide (SPIRIVA RESPIMAT) 2.5 mcg/actuation inhalation mist Inhale 2 puffs daily. 4 g 11    torsemide (DEMADEX) 20 MG tablet Take 3 tablets (60 mg total) by mouth daily.      vitamin A-3,000 mcg RAE, 10,000 UNIT, 3,000 mcg RAE (10,000 UNIT) capsule Take 1 capsule (3,000 mcg of RAE total) by mouth daily.      vutrisiran (AMVUTTRA) 25 mg/0.5 mL injection Inject 0.5 mL (25 mg total) under the skin once. Every 12 Weeks      NON FORMULARY APPLY FROM NECK DOWN TWICE A DAY (Patient not taking: Reported on 09/06/2023)       No current facility-administered medications for this visit.       Family History:  The patient's family history includes Cancer in her father; Hypertension in her mother; No Known Problems in her brother, brother, maternal aunt, maternal grandfather, maternal grandmother, maternal uncle, paternal aunt, paternal grandfather, paternal grandmother, paternal uncle, sister, sister, and another family member.    Social history:  She  reports that she quit smoking about 5 years ago. Her smoking use included  cigarettes. She has never used smokeless tobacco. She reports that she does not drink alcohol and does not use drugs.    Review of Systems:  As per HPI.  Rest of the review of ten systems is negative or unremarkable except as stated above.    Physical Exam:  VITAL SIGNS:   Vitals:    09/06/23 1258   BP: 152/72   Pulse: 73   SpO2: 92%         Wt Readings from Last 3 Encounters:   09/06/23 93 kg (205 lb)   09/02/23 92.6 kg (204 lb 1.6 oz)   08/27/23 92.7 kg (204 lb 4.8 oz)      Today's Body mass index is 32.11 kg/m??.   Height: 170.2 cm (5' 7)  CONSTITUTIONAL: well-appearing in no acute distress  EYES: Conjunctivae and sclerae clear and anicteric.  ENT: Benign.   CARDIOVASCULAR: JVP not seen above the clavicle with HOB at 90 degrees. Rate and rhythm are regular.  There is no lifts or heaves.  Normal S1, S2. There is no murmur, gallops or rubs.  Radial and pedal pulses are 2+, bilaterally.   There is no edema to ankles, bilaterally.   RESPIRATORY: Decreased breath sounds at bases.  There are no wheezes.  GASTROINTESTINAL: Soft, non-tender, with audible bowel sounds. Abdomen nondistended.  Liver is nonpalpable.  SKIN: No rashes, ecchymosis or petechiae.  Warm, well perfused.   MUSCULOSKELETAL:  no joint swelling   NEURO/PSYCH: Appropriate mood and affect. Alert and oriented to person, place, and time. No gross motor or sensory deficits evident.    Pertinent Laboratory Studies:   Office Visit on 09/06/2023   Component Date Value Ref Range Status    Sodium 09/06/2023 136  135 - 145 mmol/L Final    Potassium 09/06/2023 4.3  3.4 - 4.8 mmol/L Final    Chloride 09/06/2023 100  98 - 107 mmol/L Final    CO2 09/06/2023 25.9  20.0 - 31.0 mmol/L Final    Anion Gap 09/06/2023 10  5 - 14 mmol/L Final    BUN 09/06/2023 84 (H)  9 - 23 mg/dL Final    Creatinine 16/09/9603 2.61 (H)  0.55 - 1.02 mg/dL Final    BUN/Creatinine Ratio 09/06/2023 32   Final    eGFR [name]-EPI (2021) Female 09/06/2023 18 (L)  >=60 mL/min/1.7m2 Final    Glucose 09/06/2023 248 (H)  70 - 179 mg/dL Final    Calcium 54/08/8118 10.0  8.7 - 10.4 mg/dL Final    Magnesium 14/78/2956 2.3  1.6 - 2.6 mg/dL Final    PRO-BNP 21/30/8657 4,575.0 (H)  <=300.0 pg/mL Final   Admission on 08/30/2023, Discharged on 09/02/2023   Component Date Value Ref Range Status    Sodium 08/30/2023 134 (L)  135 - 145 mmol/L Final    Potassium 08/30/2023 4.4  3.4 - 4.8 mmol/L Final    Chloride 08/30/2023 99  98 - 107 mmol/L Final    CO2 08/30/2023 24.3  20.0 - 31.0 mmol/L Final    Anion Gap 08/30/2023 11  5 - 14 mmol/L Final    BUN 08/30/2023 103 (H)  9 - 23 mg/dL Final    Creatinine 84/69/6295 3.06 (H)  0.55 - 1.02 mg/dL Final    BUN/Creatinine Ratio 08/30/2023 34   Final    eGFR [name]-EPI (2021) Female 08/30/2023 15 (L)  >=60 mL/min/1.86m2 Final    Glucose 08/30/2023 260 (H)  70 - 179 mg/dL Final    Calcium 28/41/3244 9.5  8.7 -  10.4 mg/dL Final    Albumin 16/09/9603 3.4  3.4 - 5.0 g/dL Final    Total Protein 08/30/2023 7.7  5.7 - 8.2 g/dL Final    Total Bilirubin 08/30/2023 0.4  0.3 - 1.2 mg/dL Final    AST 54/08/8118 31  <=34 U/L Final    ALT 08/30/2023 15  10 - 49 U/L Final    Alkaline Phosphatase 08/30/2023 197 (H)  46 - 116 U/L Final    PT 08/30/2023 13.2 (H)  9.9 - 12.6 sec Final    INR 08/30/2023 1.20   Final    hsTroponin I 08/30/2023 164 (HH)  <=34 ng/L Final    EKG Ventricular Rate 08/30/2023 81  BPM Final    EKG Atrial Rate 08/30/2023 83  BPM Final    EKG QRS Duration 08/30/2023 118  ms Final    EKG Q-T Interval 08/30/2023 408  ms Final    EKG QTC Calculation 08/30/2023 473  ms Final    EKG Calculated R Axis 08/30/2023 268  degrees Final    EKG Calculated T Axis 08/30/2023 63  degrees Final    QTC Fredericia 08/30/2023 450  ms Final    WBC 08/30/2023 9.0  3.6 - 11.2 10*9/L Final    RBC 08/30/2023 4.24  3.95 - 5.13 10*12/L Final    HGB 08/30/2023 12.8  11.3 - 14.9 g/dL Final    HCT 14/78/2956 39.1  34.0 - 44.0 % Final    MCV 08/30/2023 92.3  77.6 - 95.7 fL Final    MCH 08/30/2023 30.2  25.9 - 32.4 pg Final    MCHC 08/30/2023 32.7  32.0 - 36.0 g/dL Final    RDW 21/30/8657 14.5  12.2 - 15.2 % Final    MPV 08/30/2023 8.6  6.8 - 10.7 fL Final    Platelet 08/30/2023 320  150 - 450 10*9/L Final    nRBC 08/30/2023 0  <=4 /100 WBCs Final    Neutrophils % 08/30/2023 79.1  % Final    Lymphocytes % 08/30/2023 6.8  % Final    Monocytes % 08/30/2023 12.3  % Final    Eosinophils % 08/30/2023 1.0  % Final    Basophils % 08/30/2023 0.8  % Final    Absolute Neutrophils 08/30/2023 7.1  1.8 - 7.8 10*9/L Final    Absolute Lymphocytes 08/30/2023 0.6 (L)  1.1 - 3.6 10*9/L Final    Absolute Monocytes 08/30/2023 1.1 (H)  0.3 - 0.8 10*9/L Final    Absolute Eosinophils 08/30/2023 0.1  0.0 - 0.5 10*9/L Final    Absolute Basophils 08/30/2023 0.1  0.0 - 0.1 10*9/L Final    hsTroponin I 08/30/2023 162 (HH)  <=34 ng/L Final    delta hsTroponin I 08/30/2023 2  <=7 ng/L Final    Specimen Source 08/30/2023 Venous   Final    FIO2 Venous 08/30/2023 Not Specified   Final    pH, Venous 08/30/2023 7.38  7.32 - 7.43 Final    pCO2, Ven 08/30/2023 48  40 - 60 mm Hg Final    pO2, Ven 08/30/2023 41 (H)  35 - 40 mm Hg Final    HCO3, Ven 08/30/2023 26  22 - 27 mmol/L Final    Base Excess, Ven 08/30/2023 2.9 (H)  -2.0 - 2.0 Final    O2 Saturation, Venous 08/30/2023 73.3  40.0 - 85.0 % Final    BNP 08/30/2023 450.68 (H)  <=100 pg/mL Final    SARS-CoV-2 PCR 08/30/2023 Negative  Negative Final    Influenza  A 08/30/2023 Negative  Negative Final    Influenza B 08/30/2023 Negative  Negative Final    RSV 08/30/2023 Negative  Negative Final    hsTroponin I 08/31/2023 173 (HH)  <=34 ng/L Final    delta hsTroponin I 08/31/2023 11 (H)  <=7 ng/L Final    Color, UA 08/31/2023 Light Yellow   Final    Clarity, UA 08/31/2023 Clear   Final    Specific Gravity, UA 08/31/2023 1.013  1.003 - 1.030 Final    pH, UA 08/31/2023 5.0  5.0 - 9.0 Final    Leukocyte Esterase, UA 08/31/2023 Small (A)  Negative Final    Nitrite, UA 08/31/2023 Negative  Negative Final    Protein, UA 08/31/2023 Negative  Negative Final    Glucose, UA 08/31/2023 Negative  Negative Final    Ketones, UA 08/31/2023 Negative  Negative Final    Urobilinogen, UA 08/31/2023 <2.0 mg/dL  <1.6 mg/dL Final    Bilirubin, UA 08/31/2023 Negative  Negative Final    Blood, UA 08/31/2023 Trace (A)  Negative Final    RBC, UA 08/31/2023 <1  <=4 /HPF Final    WBC, UA 08/31/2023 3  0 - 5 /HPF Final    Squam Epithel, UA 08/31/2023 2  0 - 5 /HPF Final    Bacteria, UA 08/31/2023 Moderate (A)  None Seen /HPF Final    Hyaline Casts, UA 08/31/2023 6 (H)  0 - 1 /LPF Final    Mucus, UA 08/31/2023 Rare (A)  None Seen /HPF Final    Creat U 08/31/2023 75.3  Undefined mg/dL Final    Protein, Ur 10/96/0454 7.4  Undefined mg/dL Final    Protein/Creatinine Ratio, Urine 08/31/2023 0.098  Undefined Final    WBC 08/31/2023 8.2  3.6 - 11.2 10*9/L Final    RBC 08/31/2023 3.95  3.95 - 5.13 10*12/L Final    HGB 08/31/2023 11.9  11.3 - 14.9 g/dL Final    HCT 09/81/1914 36.5  34.0 - 44.0 % Final    MCV 08/31/2023 92.5  77.6 - 95.7 fL Final    MCH 08/31/2023 30.1  25.9 - 32.4 pg Final    MCHC 08/31/2023 32.6  32.0 - 36.0 g/dL Final    RDW 78/29/5621 14.8  12.2 - 15.2 % Final    MPV 08/31/2023 8.4  6.8 - 10.7 fL Final    Platelet 08/31/2023 269  150 - 450 10*9/L Final    Magnesium 08/31/2023 2.3  1.6 - 2.6 mg/dL Final    Phosphorus 30/86/5784 4.5  2.4 - 5.1 mg/dL Final    Sodium 69/62/9528 140  135 - 145 mmol/L Final    Potassium 08/31/2023 3.9  3.4 - 4.8 mmol/L Final    Chloride 08/31/2023 101  98 - 107 mmol/L Final    CO2 08/31/2023 26.2  20.0 - 31.0 mmol/L Final    Anion Gap 08/31/2023 13  5 - 14 mmol/L Final    BUN 08/31/2023 99 (H)  9 - 23 mg/dL Final    Creatinine 41/32/4401 2.82 (H)  0.55 - 1.02 mg/dL Final    BUN/Creatinine Ratio 08/31/2023 35   Final    eGFR [name]-EPI (2021) Female 08/31/2023 16 (L)  >=60 mL/min/1.65m2 Final    Glucose 08/31/2023 132  70 - 179 mg/dL Final    Calcium 02/72/5366 9.4  8.7 - 10.4 mg/dL Final    Albumin 44/02/4741 3.0 (L)  3.4 - 5.0 g/dL Final    Total Protein 08/31/2023 6.9  5.7 - 8.2 g/dL Final  Total Bilirubin 08/31/2023 0.5  0.3 - 1.2 mg/dL Final    AST 16/09/9603 28  <=34 U/L Final    ALT 08/31/2023 10  10 - 49 U/L Final    Alkaline Phosphatase 08/31/2023 174 (H)  46 - 116 U/L Final    Hemoglobin A1C 08/30/2023 5.8 (H)  4.8 - 5.6 % Final    Estimated Average Glucose 08/30/2023 120  mg/dL Final    TSH 54/08/8118 0.963  0.550 - 4.780 uIU/mL Final    T3, Free 08/31/2023 1.77 (L)  2.30 - 4.20 pg/mL Final    Free T4 08/31/2023 1.79 (H)  0.89 - 1.76 ng/dL Final    Cortisol 14/78/2956 41.5  See Comment ug/dL Final    Glucose, POC 21/30/8657 150  70 - 179 mg/dL Final    Glucose, POC 84/69/6295 220 (H)  70 - 179 mg/dL Final    Glucose, POC 28/41/3244 172  70 - 179 mg/dL Final    Glucose, POC 12/30/7251 168  70 - 179 mg/dL Final    Sodium 66/44/0347 138  135 - 145 mmol/L Final    Potassium 09/01/2023 4.5  3.4 - 4.8 mmol/L Final    Chloride 09/01/2023 101  98 - 107 mmol/L Final    CO2 09/01/2023 28.8  20.0 - 31.0 mmol/L Final    Anion Gap 09/01/2023 8  5 - 14 mmol/L Final    BUN 09/01/2023 97 (H)  9 - 23 mg/dL Final    Creatinine 42/59/5638 2.87 (H)  0.55 - 1.02 mg/dL Final    BUN/Creatinine Ratio 09/01/2023 34   Final    eGFR [name]-EPI (2021) Female 09/01/2023 16 (L)  >=60 mL/min/1.57m2 Final    Glucose 09/01/2023 132  70 - 179 mg/dL Final    Calcium 75/64/3329 9.5  8.7 - 10.4 mg/dL Final    Magnesium 51/88/4166 2.3  1.6 - 2.6 mg/dL Final    WBC 06/26/1600 9.6  3.6 - 11.2 10*9/L Final    RBC 09/01/2023 3.97  3.95 - 5.13 10*12/L Final    HGB 09/01/2023 11.8  11.3 - 14.9 g/dL Final    HCT 09/32/3557 36.6  34.0 - 44.0 % Final    MCV 09/01/2023 92.2  77.6 - 95.7 fL Final    MCH 09/01/2023 29.7  25.9 - 32.4 pg Final    MCHC 09/01/2023 32.2  32.0 - 36.0 g/dL Final    RDW 32/20/2542 14.8  12.2 - 15.2 % Final    MPV 09/01/2023 8.5  6.8 - 10.7 fL Final    Platelet 09/01/2023 289  150 - 450 10*9/L Final    Glucose, POC 09/01/2023 144  70 - 179 mg/dL Final    Glucose, POC 70/62/3762 240 (H)  70 - 179 mg/dL Final    Glucose, POC 83/15/1761 142  70 - 179 mg/dL Final    Glucose, POC 60/73/7106 190 (H)  70 - 179 mg/dL Final    Sodium 26/94/8546 139  135 - 145 mmol/L Final    Potassium 09/02/2023 4.4  3.4 - 4.8 mmol/L Final    Chloride 09/02/2023 101  98 - 107 mmol/L Final    CO2 09/02/2023 26.1  20.0 - 31.0 mmol/L Final    Anion Gap 09/02/2023 12  5 - 14 mmol/L Final    BUN 09/02/2023 89 (H)  9 - 23 mg/dL Final    Creatinine 27/02/5008 3.20 (H)  0.55 - 1.02 mg/dL Final    BUN/Creatinine Ratio 09/02/2023 28   Final    eGFR [name]-EPI (2021) Female  09/02/2023 14 (L)  >=60 mL/min/1.10m2 Final    Glucose 09/02/2023 125  70 - 179 mg/dL Final    Calcium 16/09/9603 9.7  8.7 - 10.4 mg/dL Final    Magnesium 54/08/8118 2.1  1.6 - 2.6 mg/dL Final    Glucose, POC 14/78/2956 153  70 - 179 mg/dL Final    Glucose, POC 21/30/8657 251 (H)  70 - 179 mg/dL Final   Office Visit on 08/27/2023   Component Date Value Ref Range Status    BNP 08/27/2023 324.32 (H)  <=100 pg/mL Final    Sodium 08/27/2023 133 (L)  135 - 145 mmol/L Final    Potassium 08/27/2023 4.3  3.4 - 4.8 mmol/L Final    Chloride 08/27/2023 95 (L)  98 - 107 mmol/L Final    CO2 08/27/2023 28.2  20.0 - 31.0 mmol/L Final    Anion Gap 08/27/2023 10  5 - 14 mmol/L Final    BUN 08/27/2023 96 (H)  9 - 23 mg/dL Final    Creatinine 84/69/6295 3.41 (H)  0.55 - 1.02 mg/dL Final    BUN/Creatinine Ratio 08/27/2023 28   Final    eGFR [name]-EPI (2021) Female 08/27/2023 13 (L)  >=60 mL/min/1.27m2 Final    Glucose 08/27/2023 250 (H)  70 - 179 mg/dL Final    Calcium 28/41/3244 9.6  8.7 - 10.4 mg/dL Final    Magnesium 12/30/7251 2.2  1.6 - 2.6 mg/dL Final   No results displayed because visit has over 200 results.      Office Visit on 07/30/2023   Component Date Value Ref Range Status    Joint Fluid Culture 07/30/2023 NO GROWTH   Final    Gram Stain Result 07/30/2023 Direct Specimen Gram Stain   Final    Gram Stain Result 07/30/2023 No polymorphonuclear leukocytes seen   Final    Gram Stain Result 07/30/2023 No organisms seen   Final    Fluid Type 07/30/2023 Fluid, Joint   Final    Color, Fluid 07/30/2023 Brown   Final    Appearance, Fluid 07/30/2023 Cloudy   Final    Nucleated Cells, Fluid 07/30/2023 1,150  Undefined ul Final    RBC, Fluid 07/30/2023 5,975  ul Final    Neutrophil %, Fluid 07/30/2023 39.0  % Final    Lymphocytes %, Fluid 07/30/2023 1.0  % Final    Mono/Macro % , Fluid 07/30/2023 60.0  % Final    #Cells Counted BF Diff 07/30/2023 100   Final    Crystal Analysis 07/30/2023 Crystals present (AA)  No crystals seen Final   Office Visit on 07/14/2023   Component Date Value Ref Range Status    Sodium 07/14/2023 139  135 - 145 mmol/L Final    Potassium 07/14/2023 4.4  3.4 - 4.8 mmol/L Final    Chloride 07/14/2023 102  98 - 107 mmol/L Final    CO2 07/14/2023 27.7  20.0 - 31.0 mmol/L Final    Anion Gap 07/14/2023 9  5 - 14 mmol/L Final    BUN 07/14/2023 58 (H)  9 - 23 mg/dL Final    Creatinine 66/44/0347 2.50 (H)  0.55 - 1.02 mg/dL Final    BUN/Creatinine Ratio 07/14/2023 23   Final    eGFR [name]-EPI (2021) Female 07/14/2023 19 (L)  >=60 mL/min/1.50m2 Final Glucose 07/14/2023 158  70 - 179 mg/dL Final    Calcium 42/59/5638 9.6  8.7 - 10.4 mg/dL Final    Albumin 75/64/3329 3.4  3.4 - 5.0 g/dL Final  Total Protein 07/14/2023 7.6  5.7 - 8.2 g/dL Final    Total Bilirubin 07/14/2023 0.4  0.3 - 1.2 mg/dL Final    AST 19/14/7829 16  <=34 U/L Final    ALT 07/14/2023 8 (L)  10 - 49 U/L Final    Alkaline Phosphatase 07/14/2023 132 (H)  46 - 116 U/L Final    BNP 07/14/2023 118.62 (H)  <=100 pg/mL Final    Magnesium 07/14/2023 2.3  1.6 - 2.6 mg/dL Final    TSH 56/21/3086 1.376  0.550 - 4.780 uIU/mL Final    EKG Ventricular Rate 07/14/2023 69  BPM Final    EKG Atrial Rate 07/14/2023 69  BPM Final    EKG P-R Interval 07/14/2023 188  ms Final    EKG QRS Duration 07/14/2023 108  ms Final    EKG Q-T Interval 07/14/2023 458  ms Final    EKG QTC Calculation 07/14/2023 490  ms Final    EKG Calculated P Axis 07/14/2023 22  degrees Final    EKG Calculated R Axis 07/14/2023 131  degrees Final    EKG Calculated T Axis 07/14/2023 -9  degrees Final    QTC Fredericia 07/14/2023 480  ms Final    Vitamin D Total (25OH) 07/14/2023 33.7  20.0 - 80.0 ng/mL Final       Lab Results   Component Value Date    PRO-BNP 4,575.0 (H) 09/06/2023    PRO-BNP 3,254.0 (H) 12/04/2022    PRO-BNP 3,420.0 (H) 07/03/2021    Creatinine 2.61 (H) 09/06/2023    Creatinine 3.20 (H) 09/02/2023    Creatinine 0.79 01/06/2011    BUN 84 (H) 09/06/2023    BUN 89 (H) 09/02/2023    BUN 20 01/06/2011    Sodium 136 09/06/2023    Sodium 140 01/06/2011    Potassium 4.3 09/06/2023    Potassium 4.1 01/06/2011    CO2 25.9 09/06/2023    CO2 30 01/06/2011    Magnesium 2.3 09/06/2023    Magnesium 2.0 01/06/2011    Total Bilirubin 0.5 08/31/2023    INR 1.20 08/30/2023    INR 1.0 01/06/2011       Lab Results   Component Value Date    Digoxin Level 0.9 10/01/2020       Lab Results   Component Value Date    TSH 0.963 08/31/2023    Cholesterol 95 12/06/2021    Triglycerides 66 12/06/2021    HDL 42 12/06/2021    Non-HDL Cholesterol 53 (L) 12/06/2021    LDL Calculated 40 12/06/2021       Lab Results   Component Value Date    WBC 9.6 09/01/2023    WBC 12.5 (H) 01/06/2011    HGB 11.8 09/01/2023    HGB 12.9 01/06/2011    HCT 36.6 09/01/2023    HCT 40.8 01/06/2011    Platelet 289 09/01/2023    Platelet 260 01/06/2011       Pertinent Test Results from Today:  None    Other pertinent records were reviewed.    The following are further history from the patient's EPIC record for reference:     Past Medical History:   Diagnosis Date    Acute kidney injury superimposed on chronic kidney disease (CMS-HCC) 10/11/2020    Acute on chronic diastolic (congestive) heart failure (CMS-HCC) 08/23/2020    Acute on chronic diastolic congestive heart failure (CMS-HCC)     AKI (acute kidney injury) (CMS-HCC) 04/14/2015    Lab Results  Component  Value  Date     CREATININE  1.90 (H)  06/12/2021     Had a bump in her creatinine when she was taking her diuretics every day.  She is currently taking 40 mg daily of torsemide and 50 mg of spironolactone.  Her volume status is fragile.  Previously when she stopped her diuretic she becomes short of breath.  Plan: We will check her BMP today.  We will likely have to go to 40    Anemia 08/22/2019    Iron deficiency anemia          Lab Results      Component    Value    Date           WBC    9.2    03/21/2021           RBC    3.67 (L)    03/21/2021           HGB    9.9 (L)    03/21/2021           HCT    30.7 (L)    03/21/2021           MCV    83.7    03/21/2021           MCH    26.9    03/21/2021           MCHC    32.1    03/21/2021           RDW    21.9 (H)    03/21/2021           PLT    318     Arthritis     Calculus of kidney     Calculus of ureter     CHF (congestive heart failure) (CMS-HCC)     Chronic atrial fibrillation (CMS-HCC) 07/20/2019    COPD (chronic obstructive pulmonary disease) (CMS-HCC)     Diabetes (CMS-HCC)     Gangrenous cholecystitis 10/11/2020    Generalized edema  06/17/2021    GERD (gastroesophageal reflux disease)     Hydronephrosis     Hypertension     Hyponatremia 10/11/2020    Intermediate coronary syndrome (CMS-HCC) 03/13/2014    Lower extremity edema 09/28/2020    Lumbar stenosis     Microscopic hematuria     Nausea alone     Nephrolithiasis 04/17/2016    Neuropathy     Nocturia     Nocturia 07/01/2017    Other chronic cystitis     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  See plans for anemia, AKI  We are also tapering her gabapentin.  Currently on 300 mg 3 times daily.  We will decrease it to twice daily, and then nightly.  She states she is not having any recurrence of her pain.    Pulmonary hypertension (CMS-HCC)     Renal colic     Shortness of breath 04/28/2020    Sleep apnea     Unstable angina pectoris (CMS-HCC) 03/13/2014       Past Surgical History:   Procedure Laterality Date    BACK SURGERY  1995    CARPAL TUNNEL RELEASE Left 2014    HYSTERECTOMY  1971    IR INSERT CHOLECYSTOSMY TUBE PERCUTANEOUS  10/02/2020    IR INSERT CHOLECYSTOSMY TUBE PERCUTANEOUS 10/02/2020 Braulio Conte, MD IMG VIR H&V [facility]    LUMBAR DISC SURGERY  PR REMOVAL GALLBLADDER N/A 10/06/2020    Procedure: CHOLECYSTECTOMY;  Surgeon: Katherina Mires, MD;  Location: MAIN OR [facility];  Service: Trauma    PR RIGHT HEART CATH O2 SATURATION & CARDIAC OUTPUT N/A 09/30/2020    Procedure: Right Heart Catheterization;  Surgeon: Neal Dy, MD;  Location[facility] CATH;  Service: Cardiology    PR RIGHT HEART CATH O2 SATURATION & CARDIAC OUTPUT N/A 01/09/2021    Procedure: Right Heart Catheterization;  Surgeon: Lesle Reek, MD;  Location: [facility] CATH;  Service: Cardiology

## 2023-09-07 ENCOUNTER — Ambulatory Visit: Admit: 2023-09-07 | Discharge: 2023-09-08 | Payer: MEDICARE | Attending: Family | Primary: Family

## 2023-09-07 MED ADMIN — triamcinolone acetonide (KENALOG) injection 10 mg: 10 mg | INTRA_ARTICULAR | @ 19:00:00 | Stop: 2023-09-07

## 2023-09-07 NOTE — Unmapped (Signed)
SPORTS MEDICINE RETURN VISIT    ASSESSMENT AND PLAN      Diagnosis ICD-10-CM Associated Orders   1. Left wrist pain  M25.532 Med Joint Inj: L ulnocarpal           Ulnar-sided wrist pain.  Will perform ultrasound-guided lateral approach TFCC steroid injection.  Wrist brace as needed.  She will use colchicine as prescribed for gout symptoms.    No follow-ups on file.    Procedure(s):  Ultrasound-guided left wrist TFCC steroid injection    NEED FOR SONOGRAPHIC GUIDANCE    Given the complexity of this problem, the anatomic location of this structure, sonographic guidance is recommended to prevent injury to neurovascular structures and confirm accuracy of injection. The accuracy of doing these injections blind is poor and the benefit to the patient by using ultrasound guidance is significant to avoid complications.     Reference:  American Medical Society for Sports Medicine (AMSSM) position statement: interventional musculoskeletal ultrasound in sports medicine.  Morey Hummingbird MM, Adams E, Berkoff D, Concoff AL, Jiles Crocker J Sports Med. 2014 Oct 20. pii: [id number]. doi: 10.1136/[id number]        SUBJECTIVE     Chief Complaint: No chief complaint on file.      History of Present Illness: 82 y.o. female who presents for left ulnar-sided wrist pain.  This is similar to her previous pain treated with steroid injection.  She has had prior diagnosis of gout and was recently prescribed colchicine but has not began taking it.  She would like to proceed with ultrasound-guided injection for her wrist pain and sees it been previously very helpful for her.  No interval injury.    Past Medical History:   Past Medical History:   Diagnosis Date    Acute kidney injury superimposed on chronic kidney disease (CMS-HCC) 10/11/2020    Acute on chronic diastolic (congestive) heart failure (CMS-HCC) 08/23/2020    Acute on chronic diastolic congestive heart failure (CMS-HCC)     AKI (acute kidney injury) (CMS-HCC) 04/14/2015    Lab Results  Component  Value  Date     CREATININE  1.90 (H)  06/12/2021     Had a bump in her creatinine when she was taking her diuretics every day.  She is currently taking 40 mg daily of torsemide and 50 mg of spironolactone.  Her volume status is fragile.  Previously when she stopped her diuretic she becomes short of breath.  Plan: We will check her BMP today.  We will likely have to go to 40    Anemia 08/22/2019    Iron deficiency anemia          Lab Results      Component    Value    Date           WBC    9.2    03/21/2021           RBC    3.67 (L)    03/21/2021           HGB    9.9 (L)    03/21/2021           HCT    30.7 (L)    03/21/2021           MCV    83.7    03/21/2021           MCH    26.9    03/21/2021  MCHC    32.1    03/21/2021           RDW    21.9 (H)    03/21/2021           PLT    318     Arthritis     Calculus of kidney     Calculus of ureter     CHF (congestive heart failure) (CMS-HCC)     Chronic atrial fibrillation (CMS-HCC) 07/20/2019    COPD (chronic obstructive pulmonary disease) (CMS-HCC)     Diabetes (CMS-HCC)     Gangrenous cholecystitis 10/11/2020    Generalized edema  06/17/2021    GERD (gastroesophageal reflux disease)     Hydronephrosis     Hypertension     Hyponatremia 10/11/2020    Intermediate coronary syndrome (CMS-HCC) 03/13/2014    Lower extremity edema 09/28/2020    Lumbar stenosis     Microscopic hematuria     Nausea alone     Nephrolithiasis 04/17/2016    Neuropathy     Nocturia     Nocturia 07/01/2017    Other chronic cystitis     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  See plans for anemia, AKI  We are also tapering her gabapentin.  Currently on 300 mg 3 times daily.  We will decrease it to twice daily, and then nightly.  She states she is not having any recurrence of her pain.    Pulmonary hypertension (CMS-HCC)     Renal colic     Shortness of breath 04/28/2020    Sleep apnea     Unstable angina pectoris (CMS-HCC) 03/13/2014         OBJECTIVE     Physical Exam:  Vitals:   Wt Readings from Last 3 Encounters:   09/06/23 93 kg (205 lb)   09/02/23 92.6 kg (204 lb 1.6 oz)   08/27/23 92.7 kg (204 lb 4.8 oz)     Estimated body mass index is 32.11 kg/m?? as calculated from the following:    Height as of 09/06/23: 170.2 cm (5' 7).    Weight as of 09/06/23: 93 kg (205 lb).  Gen: Well-appearing female in no acute distress  MSK: Left wrist without any significant focal swelling.  Tenderness to palpation about ulnar aspect of the ulnocarpal joint.  No instability at the DRUJ with toggle test.  Pain with ulnar deviation of the wrist.  Swelling of the olecranon bursa noted without any significant tenderness to palpation or affect on the range of motion of the elbow.    Imaging/other tests: No new x-rays taken today.    @SPORTSPROMIS @      ADMINISTRATIVE     I have personally reviewed and interpreted the images (as available).  Point-of-care ultrasound imaging is on file and stored in a permanent location (if performed).  I have personally reviewed prior records and incorporated relevant information above (as available).    @SMIBILLING @    PROCEDURES     Med Joint Inj: L ulnocarpal on 09/07/2023 3:20 PM  Indications: pain  Details: 25 G needle, ultrasound-guided lateral approach  Laterality: left  Location: wrist  Medications: 10 mg triamcinolone acetonide 10 mg/mL  Outcome: tolerated well, no immediate complications  Procedure, treatment alternatives, risks and benefits explained, specific risks discussed. Consent was given by the patient. Immediately prior to procedure a time out was called to verify the correct patient, procedure, equipment, support staff and site/side marked as required. Patient was  prepped and draped in the usual sterile fashion.     Medical Care Team Attestation: All ProcDoc orders were read back and verbally confirmed with the procedure provider, including but not limited to patient name, medication name, dose, and route, before any actions were taken.  Self Attestation: I, the provider, prepared the medications and administered for this procedure as documented above.  Provider Attestation: The information documented by members of my medical care team was reviewed and verified for accuracy by me.           DME     DME ORDER:  Dx:  ,

## 2023-09-07 NOTE — Unmapped (Signed)
Today,    MEDICATIONS:  NO medication changes today.    Call if you have questions about your medications.    LABS:  We will call you if your labs need attention.    NEXT APPOINTMENT:  Return to clinic in 2  days  with  me for Phone Visit      In general, to take care of your heart failure:  -Limit your fluid intake to 2 Liters (half-gallon) per day.    -Limit your salt intake to ideally 2-3 grams (2000-3000 mg) per day.  -Weigh yourself daily and record, and bring that weight diary to your next appointment.  (Weight gain of 2-3 pounds in 1 day typically means fluid weight.)    The medications for your heart are to help your heart and help you live longer.    Please contact us before stopping any of your heart medications.    Call the clinic at [phone] with questions.  Our clinic fax number is [phone].  If you need to reschedule future appointments, please call [phone] or [phone]  My office number (c/o De Burrs RN) is [phone] if you need further assistance.    Please do not send a MyChart message for potentially life-threatening symptoms.  Please call 911 for a true medical emergency.    To learn more about heart failure, please read [place]'s Learning to Live with Heart Failure - Available online at:  [email or web address] - open the window for Medical Management and click the link Living with Heart Failure   (Can search [facility] heart failure on the web to find the link.)

## 2023-09-08 DIAGNOSIS — Z09 Encounter for follow-up examination after completed treatment for conditions other than malignant neoplasm: Principal | ICD-10-CM

## 2023-09-08 MED ORDER — ROSUVASTATIN 5 MG TABLET
ORAL_TABLET | 0 refills | 0 days
Start: 2023-09-08 — End: ?

## 2023-09-08 MED ORDER — MIRTAZAPINE 7.5 MG TABLET
ORAL_TABLET | 1 refills | 0 days | Status: CP
Start: 2023-09-08 — End: ?

## 2023-09-08 NOTE — Unmapped (Signed)
Attempted to call [name] to notify her that PCP made aware and approved medications. No answer. Left voicemail.

## 2023-09-08 NOTE — Unmapped (Signed)
The PAC has received an incoming clinical call:    Caller name: Anthony Sar callback number: [phone]  Relationship to Patient: RN [facility]   Describe the reason for the call: Caller states she calling to report medication interaction to amiodarone with colchicine and a  second interaction rosuvastatin with vyndamax.

## 2023-09-08 NOTE — Unmapped (Signed)
Called to check in, she feels 60% better today compared to yesterday. She got an injection in her wrist and it's not hurting anymore. She also took a colchicine and that helped as well.  She feels more energized. Weight is 201. Her breathing is pretty good. Not dizzy or lightheaded, seems better. Taking 60mg  torsemide in the morning. In discussion with Dr Brooke Dare, will start Remeron 7.5mg  nightly, increasing to 15mg  nightly after 1 week if tolerating. Follow up with Dr Brooke Dare to discuss resuming insulin.

## 2023-09-09 MED ORDER — ROSUVASTATIN 5 MG TABLET
ORAL_TABLET | 0 refills | 0 days | Status: CP
Start: 2023-09-09 — End: ?

## 2023-09-10 NOTE — Unmapped (Signed)
Called patient and left a vm to please give [facility] a call back to schedule follow up visit with pcp per pcp.     could you schedule appt for pt with me sooner, or with hosp follow up if I am not available?   Thanks so much,   Dr Brooke Dare

## 2023-09-15 NOTE — Unmapped (Signed)
[facility] Refill Coordination Note    Specialty Medication(s) to be Shipped:   Cardiology: Vyndamax    Other medication(s) to be shipped: No additional medications requested for fill at this time     Barbara Huber, DOB: [birth year]  Phone: [phone] (home)       All above HIPAA information was verified with patient.     Was a Nurse, learning disability used for this call? No    Completed refill call assessment today to schedule patient's medication shipment from the [facility]  [phone]).  All relevant notes have been reviewed.     Specialty medication(s) and dose(s) confirmed: Regimen is correct and unchanged.   Changes to medications: Barbara Huber reports no changes at this time.  Changes to insurance: No  New side effects reported not previously addressed with a pharmacist or physician: None reported  Questions for the pharmacist: No    Confirmed patient received a Conservation officer, historic buildings and a Surveyor, mining with first shipment. The patient will receive a drug information handout for each medication shipped and additional FDA Medication Guides as required.       DISEASE/MEDICATION-SPECIFIC INFORMATION        N/A    SPECIALTY MEDICATION ADHERENCE     Medication Adherence    Patient reported X missed doses in the last month: 0  Specialty Medication: Vyndamax 61mg   Informant: patient              Were doses missed due to medication being on hold? No    Vyndamax 61 mg: ~12 days of medicine on hand       REFERRAL TO PHARMACIST     Referral to the pharmacist: Not needed      [facility]     Shipping address confirmed in Epic.       Delivery Scheduled: Yes, Expected medication delivery date: 09/23/23.     Medication will be delivered via Next Day Courier to the prescription address in Epic WAM.    Camillo Flaming, PharmD   [facility]  Specialty Pharmacist

## 2023-09-16 ENCOUNTER — Ambulatory Visit: Admit: 2023-09-16 | Discharge: 2023-09-17 | Payer: MEDICARE

## 2023-09-16 DIAGNOSIS — I5032 Chronic diastolic (congestive) heart failure: Principal | ICD-10-CM

## 2023-09-16 LAB — BASIC METABOLIC PANEL
ANION GAP: 11 mmol/L (ref 5–14)
BLOOD UREA NITROGEN: 89 mg/dL — ABNORMAL HIGH (ref 9–23)
BUN / CREAT RATIO: 29
CALCIUM: 9.4 mg/dL (ref 8.7–10.4)
CHLORIDE: 103 mmol/L (ref 98–107)
CO2: 22.1 mmol/L (ref 20.0–31.0)
CREATININE: 3.11 mg/dL — ABNORMAL HIGH
EGFR CKD-EPI (2021) FEMALE: 15 mL/min/{1.73_m2} — ABNORMAL LOW (ref >=60)
GLUCOSE RANDOM: 172 mg/dL (ref 70–179)
POTASSIUM: 4.7 mmol/L (ref 3.4–4.8)
SODIUM: 136 mmol/L (ref 135–145)

## 2023-09-16 LAB — PRO-BNP: PRO-BNP: 14492 pg/mL — ABNORMAL HIGH (ref <=300.0)

## 2023-09-16 LAB — MAGNESIUM: MAGNESIUM: 2.4 mg/dL (ref 1.6–2.6)

## 2023-09-16 MED ORDER — GABAPENTIN 100 MG CAPSULE
ORAL_CAPSULE | Freq: Two times a day (BID) | ORAL | 11 refills | 30 days | Status: CP
Start: 2023-09-16 — End: 2024-09-15

## 2023-09-16 MED ORDER — METOLAZONE 5 MG TABLET
ORAL | 1 refills | 12 days | Status: CP | PRN
Start: 2023-09-16 — End: 2023-10-16

## 2023-09-22 ENCOUNTER — Telehealth: Admit: 2023-09-22 | Discharge: 2023-09-23 | Payer: MEDICARE

## 2023-09-22 DIAGNOSIS — R54 Age-related physical debility: Principal | ICD-10-CM

## 2023-09-22 DIAGNOSIS — I5032 Chronic diastolic (congestive) heart failure: Principal | ICD-10-CM

## 2023-09-22 DIAGNOSIS — Z09 Encounter for follow-up examination after completed treatment for conditions other than malignant neoplasm: Principal | ICD-10-CM

## 2023-09-22 DIAGNOSIS — E1122 Type 2 diabetes mellitus with diabetic chronic kidney disease: Principal | ICD-10-CM

## 2023-09-22 DIAGNOSIS — N184 Chronic kidney disease, stage 4 (severe): Principal | ICD-10-CM

## 2023-09-22 DIAGNOSIS — E038 Other specified hypothyroidism: Principal | ICD-10-CM

## 2023-09-22 DIAGNOSIS — Z794 Long term (current) use of insulin: Principal | ICD-10-CM

## 2023-09-22 MED FILL — VYNDAMAX 61 MG CAPSULE: ORAL | 30 days supply | Qty: 30 | Fill #9

## 2023-09-22 NOTE — Unmapped (Signed)
Called pt in response to [employer]. Weight yesterday and day before was 202. It was 203 today. No metolazone since 9/19. She feels OK, no SOB or swelling, but she took her 60mg  torsemide this morning and hasn't peed yet. Advised her to take metolazone this afternoon and an additional 40mg  torsemide. Labs tomorrow including pro-BNP at nephrology appointment.

## 2023-09-22 NOTE — Unmapped (Signed)
Internal Medicine Clinic Visit  Person Contacted: Patient  Contact Phone number: [phone] (home)   Is there someone else in the room? Yes. What is your relationship? Daughter . Do you want this person here for the visit? yes.  Reason for visit: follow up    A/P:      Chronic kidney disease ([name]), stage IV (severe) (CMS-HCC)  Overview:  Lab Results   Component Value Date    CREATININE 3.11 (H) 09/16/2023   Worsening creatinine, reports decreasing urine output.   Diuretics managed by cardiology  Plan: has nephrology appt tomorrow and will get labs    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)  Overview:  Secondary to TTR amyloid. Followed by Dr Emi Holes. afamidis and amvuttra  Recent admissions for volume overload. Walks a thin line, with her [name].   [facility] cardiology regularly  Plan: renal function tomorrow.       Frailty  Still making her own food, lives with her daughter.   Given recent hospitalizations I asked her if she would like to be re hospitalized if necessary and she said yes.     Type 2 diabetes mellitus with stage 4 chronic kidney disease, with long-term current use of insulin (CMS-HCC)  Overview:  Lab Results   Component Value Date    A1C 5.8 (H) 08/30/2023    A1C 5.8 (H) 04/14/2023    A1C 5.7 (H) 10/29/2022     Diabetes, on lantus 4 unit qhs at bedtime. Due to low GFR, off other meds. Did not tolerate jardiance, due to UTI  BS s at home in range, we will watch for hypoglycemia  Plan: Advised she can stop insulin. Can restart if fasting blood sugars     Subclinical hypothyroidism  -   Will recheck labs, given on amiodarone    TSH; Future  -     T4, Free; Future    Hospital discharge follow-up  -     Ambulatory Referral to Internal Medicine      Return in about 6 weeks (around 11/03/2023).    HPI:   Pt is a 82 y.o. female with a history of cardiac amyloid. [name], COPD, DM, [name]  Insulin/blood sugars  Doing pretty good   Breathing feels good. Diuretics being   Appetite is so so. Walking around. Making her own coffee, making her own breakfast.   Blood sugars: 140 and 150 post hospital. Now 107, 117. On 4 units. Insulin. Off insulin-not a nuisance to give it.   -Advised to stop insulin.   Was urinating okay. Until today. Urinated one time today.   -No longer taking mirtazapine.       Lab Results   Component Value Date    A1C 5.8 (H) 08/30/2023           Problem List:  Patient Active Problem List   Diagnosis    Spinal stenosis of lumbar region    Thoracic or lumbosacral neuritis or radiculitis    Lumbosacral spondylosis    COPD (chronic obstructive pulmonary disease) (CMS-HCC)    Sleep apnea in adult    Essential hypertension    Type 2 diabetes mellitus with stage 4 chronic kidney disease, with long-term current use of insulin (CMS-HCC)    Chronic cystitis    Incomplete bladder emptying    Peripheral neuropathy    Chronic respiratory failure with hypoxia (CMS-HCC)    Persistent atrial fibrillation (CMS-HCC)    Anemia in chronic kidney disease    Chronic kidney  disease ([name]), stage IV (severe) (CMS-HCC)    Pulmonary hypertension (CMS-HCC)    Chronic prescription opiate use    Dyspnea on exertion    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)    Cardiac amyloidosis (CMS-HCC)    Recurrent cold sores    Dyspepsia    Iron deficiency anemia due to chronic blood loss    Neuropathy    Left hip pain    Familial amyloid heart disease (CMS-HCC)    Hereditary amyloidosis (CMS-HCC)    Osteoporosis with current pathological fracture    Acute exacerbation of CHF (congestive heart failure) (CMS-HCC)    Troponin level elevated       Medications:  Reviewed in EPIC    S  Physical Exam:   Vital Signs:  BP Readings from Last 3 Encounters:   09/22/23 117/65   09/06/23 152/72   09/02/23 129/78      Wt Readings from Last 3 Encounters:   09/22/23 92.1 kg (203 lb)   09/06/23 93 kg (205 lb)   09/02/23 92.6 kg (204 lb 1.6 oz)      General appearance -   On oxygen  Able to talk  Some facial edema    Records review  Lab Results   Component Value Date    A1C 5.8 (H) 08/30/2023        Health Maintenance Due   Topic Date Due    DEXA Scan  Never done    Retinal Eye Exam  Never done    DTaP/Tdap/Td Vaccines (1 - Tdap) Never done    Medicare Annual Wellness Visit (AWV)  11/25/2018    Urine Albumin/Creatinine Ratio  12/12/2021    Foot Exam  02/04/2022    COVID-19 Vaccine (5 - 2023-24 season) 08/29/2023    Influenza Vaccine (1) 08/29/2023      The ASCVD Risk score (Arnett DK, et al., 2019) failed to calculate.     Medication adherence and barriers to the treatment plan have been addressed. Opportunities to optimize healthy behaviors have been discussed. Patient / caregiver voiced understanding.      The patient reports they are physically located in [place] and is currently: at home. I conducted a audio/video visit. I spent  81m 26s on the video call with the patient. I spent an additional 15 minutes on pre- and post-visit activities on the date of service .

## 2023-09-22 NOTE — Unmapped (Signed)
[facility]     Are you located in [place]? yes    Reason for visit: Follow up    Questions / Concerns that need to be addressed: none    PTHomeBP     Diabetes:  Regularly checking blood sugars?:   If yes, when? Complete log for past 7 days  Date Before Breakfast After Breakfast Before Lunch After Lunch Before Dinner After Dinner Before Bed                                                                                                                                   HCDM reviewed and updated in Epic:    We are working to make sure all of our patients??? wishes are updated in Epic and part of that is documenting a Environmental health practitioner for each patient  A Health Care Decision Maker is someone you choose who can make health care decisions for you if you are not able - who would you most want to do this for you????  is already up to date.    HCDM (patient stated preference): [name],[name] - Daughter - [phone]    BPAs completed:      __________________________________________________________________________________________    SCREENINGS COMPLETED IN FLOWSHEETS      AUDIT       PHQ2       PHQ9          GAD7       COPD Assessment       Falls Risk

## 2023-09-23 ENCOUNTER — Ambulatory Visit: Admit: 2023-09-23 | Discharge: 2023-09-23 | Payer: MEDICARE

## 2023-09-23 ENCOUNTER — Ambulatory Visit: Admit: 2023-09-23 | Discharge: 2023-09-23 | Payer: MEDICARE | Attending: Nephrology | Primary: Nephrology

## 2023-09-23 ENCOUNTER — Ambulatory Visit: Admit: 2023-09-23 | Discharge: 2023-09-23 | Payer: MEDICARE | Attending: Adult Health | Primary: Adult Health

## 2023-09-23 DIAGNOSIS — E038 Other specified hypothyroidism: Principal | ICD-10-CM

## 2023-09-23 DIAGNOSIS — I5032 Chronic diastolic (congestive) heart failure: Principal | ICD-10-CM

## 2023-09-23 DIAGNOSIS — I509 Heart failure, unspecified: Principal | ICD-10-CM

## 2023-09-23 LAB — BASIC METABOLIC PANEL
ANION GAP: 10 mmol/L (ref 5–14)
BLOOD UREA NITROGEN: 100 mg/dL — ABNORMAL HIGH (ref 9–23)
BUN / CREAT RATIO: 22
CALCIUM: 9.5 mg/dL (ref 8.7–10.4)
CHLORIDE: 98 mmol/L (ref 98–107)
CO2: 23.4 mmol/L (ref 20.0–31.0)
CREATININE: 4.46 mg/dL — ABNORMAL HIGH
EGFR CKD-EPI (2021) FEMALE: 9 mL/min/{1.73_m2} — ABNORMAL LOW (ref >=60)
GLUCOSE RANDOM: 190 mg/dL — ABNORMAL HIGH (ref 70–179)
POTASSIUM: 4.7 mmol/L (ref 3.4–4.8)
SODIUM: 131 mmol/L — ABNORMAL LOW (ref 135–145)

## 2023-09-23 LAB — T4, FREE: FREE T4: 1.39 ng/dL (ref 0.89–1.76)

## 2023-09-23 LAB — MAGNESIUM: MAGNESIUM: 2.4 mg/dL (ref 1.6–2.6)

## 2023-09-23 LAB — PRO-BNP: PRO-BNP: 17133 pg/mL — ABNORMAL HIGH (ref <=300.0)

## 2023-09-23 LAB — TSH: THYROID STIMULATING HORMONE: 2.346 u[IU]/mL (ref 0.550–4.780)

## 2023-09-23 NOTE — Unmapped (Signed)
[facility] Note    Referring Provider: Madaline Savage, MD  [address]  [place],  [place] [address]   Primary Provider: Artelia Laroche, MD  [address]  [place] [address]   Other Providers:  Dr Luretha Murphy    Reason for Visit:  Barbara Huber is a 82 y.o. female being seen for routine visit and continued care of cardiac amyloidosis .    Assessment & Plan:  1. 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 SPEP/UPEP/free light chains without concern. 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 Daughter is informed and has seen cardiology [name]).   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 2nd injection July 2024. Continue vitamin A supplementation.   - DC'd Jardiance 10mg  daily due to recurrent UTIs  - Weight 205 lb (home scale) - below last known dry weight, but appears volume up today with elevated JVP and pro-BNP  - Extra torsemide 60mg  this evening; metolazone 5mg  in the morning then return for IV Lasix   - Continue Tafamidis 61mg  daily   - Continue spironolactone 25mg  daily   - She is not interested in CardioMEMS at this time.     2. Persistent atrial fibrillation  - CHA2DS2-VASc score = 5 (1-gender, 2-Age, 1-DM, 1-HTN)  - We stopped Eliquis given recurrent anemia, shared decision making w/ pt and daughter. She is not interested in [place] (or any procedures) at this time  - She had been in afib since at least 07/2020; s/p cardioversion 01/10/21   - On amiodarone 200mg  daily - increased it back herself after seeing no improvement in tremors/jerking with decreased dose  - amiodarone monitoring - last PFTs in 04/2021. TSH (09/23/23) /LFTs (08/31/23) stable at last check   - appears to have had recurrent AF during admission (EKG from 9/2) - possibly contributing to recent admission presentation, but thankfully back in SR today by EKG last visit and by exam today     3. [name]  - Cr staying around 2.3-3.0  - significantly elevated today Cr 4.46, BUN 100 in setting of volume overload; suspect cardiorenal. Recent metolazone and colchicine use. Stop colchicine. Diuresis as above.   - following with nephrology    4. COPD  - home O2 3-4L [place] - followed by pulm  - on Advair and Spiriva    5. Iron deficiency anemia  - Finished IV Iron - ferritin 261. No longer anemic.  Lab Results   Component Value Date    FERRITIN 336.3 (H) 08/19/2023     6. Anxiety  - previously on Zoloft  - Tried Remeron, but discontinued in setting of worsening HF symptoms    7. DM  - Off Insulin, discussed w/ PCP yesterday   Lab Results   Component Value Date    A1C 5.8 (H) 08/30/2023       Follow-up:  Return in about 5 days (around 09/28/2023) for Return HF.     History of Present Illness:  Barbara Huber is a 82 y.o. female with PMHx of COPD on 3L home [place],  CHF, [name], [name], [name] She was initially admitted with decompensated [name] in 2021 in setting of  Septic Shock d/t Gangrenous Cholecystitis s/p cholecystectomy. She underwent further evaluation with PYP scan which showed TTR amyloid. She has since been followed in clinic for management of her [name]. In 2022, had numerous admissions for weakness/urosepsis, shortness of breath/orthopnea/weight gain, lightheadedness/SOB/Blurry vision found to be anemic.    Summary of most recent visits:  - 01/16/22 [facility]): Feeling well from a cardiac standpoint. Started Zoloft for anxiety  - 02/13/22 [facility]): Weight 210-211. Feeling well. Taking extra torsemide 20mg  about every other day, which was stopped for elevated Cr.   - 03/13/22 ([name]): Weight 201-216, took metolazone PRN, weight stabilized at 210lbs. Considering cardioMEMS.   - 05/13/22 ([name]): Had 3 iron infusions. Doing well w/ volume, weight 211-212lbs. Cooking, staying active around house.   - 05/21/22 ([name]): CP/SOB. Weight 217 from 213. She took metolazone x1 dose.   - 05/22/22 ([name]): Ed follow up. Back down to 213lbs. Still having CP w/ certain movement.   - 06/05/22 ([name]): Weight 214-216lb, should be 212lb.   - 06/09/22, 06/11/22, 06/22/22 ([name]): Weight down from 217 to 213lb, 211lb at home. Feeling well by 6/26.   - 07/15/22 ([name]): Switched inhaler back to Advair/Spiriva with improvement in breathing and O2 sat. Weight 207lbs, feeling more like herself.   - 08/19/22 ([name]): Feeling well. Weight 208-210lbs.   - 10/22/22 ([name]): Feeling well. Weight has been 210-214lbs.   - 12/04/22 ([name]): doing well overall, weight a bit up so taking extra torsemide.   - 02/10/23 ([name]): Weight 213-214lbs, feeling well, eating a lot. Had taken metolazone earlier in the week.   - 02/23/23 [facility]): Hospital follow up after mechanical fall with nondisplaced fractures of the left superior and inferior pubic rami. Taking torsemide 40mg , weight 211.   - 04/14/23 ([name]): Weight 210-212. 1st Amvuttra  - 07/14/23 [facility]): Weight 211-212 on torsemide 60mg  daily. 2nd Amvuttra.   - 08/18/23-08/23/23 (Admission): fatigue, weakness, DOE, recent prednisone course for gout. Admitted to [facility], poor response to IV Lasix and was changed to IV Bumex 3mg  BID and metolazone. [name] held for low BP. Cr increased to 4.0 with diuresis. She went back to the ER on 8/24 for abdominal pain. She was felt to be euvolemic at a weight of 207lb and IV diuretics were held. She was discharged on torsemide 60mg  daily on 8/26.   - 08/27/23 [facility]): hospital follow up. Felt to be dry for weight down to 204 at home, Cr 3.4 BUN 96. Changed torsemide from 60 daily to 40/20.   - 08/30/23-09/02/23 (Admission): Admitted for fatigue and SOB. Diuresed  with improvement in dyspnea. Weight on discharge was 204.   - 09/06/23 [facility]): Hospital follow up. Her weight continued to decrease on her scale by about 1lb per day. Weight 201 that morning. She was a little lightheaded and felt weak. Continued on torsemide 60mg  daily.  Cr 2.61, pro-BNP 4575  - 09/08/23 ([name]): Weight 201, breathing well.   - 09/16/23 ([facility]): Weight 205, not peeing much with torsemide 60mg . Feeling full in belly. Cr 3.11,  pro-BNP 14492. Metolazone and extra 40 torsemide.   - 09/17/23 ([facility]): Weight 203, feeling better. Continue torsemide 60/40 through weekend.     Interval history  Received mychart message from pt/daughter on 9/25 reporting hat she didn't pee much the night before and weight was 203lb that morning. Increased by 2 lbs overnight. Instructed to  take metolazone 2.5mg  and extra 40 torsemide in the afternoon. Today her weight was up and so she requested a visit. She didn't urinate much and this morning was 205lbs. She doesn't feel very SOB at rest. She is not swollen. She doesn't feel bloated today. She has been taking colchicine every 3 days. She stopped the remeron because she felt like she wasn't urinating as much since taking it.      Cardiovascular History & Procedures:    Cath / PCI:  none    CV Surgery:   none    EP Procedures and Devices:  none    Non-Invasive Evaluation(s):    Echo:  08/19/23  1. The left ventricle is normal in size with moderately increased wall  thickness.    2. 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    3. The left ventricular systolic function is hyperdynamic, LVEF is visually  estimated at >70%.    4. Mitral annular calcification is present (moderate).    5. The mitral valve leaflets are mildly thickened with normal leaflet  mobility.    6. The aortic valve is trileaflet with mildly thickened leaflets with normal  excursion.    7. The left atrium is moderately dilated in size.    8. The right ventricle is normal in size, with normal systolic function.    02/17/23    1. The left ventricle is normal in size with moderately increased wall  thickness.    2. The left ventricular systolic function is hyperdynamic, LVEF is visually  estimated at >70%.    3. The left atrium is moderately dilated in size.    4. The right ventricle is normal in size, with normal systolic function.    5. There is mild pulmonary hypertension.    12/09/21  Summary    1. The left ventricle is normal in size with severely increased wall  thickness.    2. The left ventricular systolic function is hyperdynamic, LVEF is visually  estimated at >70%.    3. There is grade III diastolic dysfunction (severely elevated filling  pressure).    4. The left atrium is moderately to severely dilated in size.    5. The right ventricle is mildly dilated in size, with normal systolic  function.    6. The right atrium is mildly dilated  in size.    7. IVC size and inspiratory change suggest elevated right atrial pressure.  (10-20 mmHg).    04/24/21   1. The left ventricle is normal in size with mildly increased wall  thickness.    2. The left ventricular systolic function is normal, LVEF is visually  estimated at > 55%.    3. There is grade II diastolic dysfunction (elevated filling pressure).    4. Mitral annular calcification is present (mild).    5. The left atrium is mildly dilated in size.    6. The right ventricle is mildly dilated in size, with low normal systolic  function.    7. IVC size and inspiratory change suggest elevated right atrial pressure.  (10-20 mmHg).    09/30/20  Summary    1. Limited study to assess systolic function.    2. IVC size and inspiratory change suggest normal right atrial pressure.  (0-5 mmHg).    3. The left ventricle is normal in size with mildly to moderately increased  wall thickness.    4. The left ventricular systolic function is normal with no obvious  wall  motion abnormalities, LVEF is visually estimated at > 55%.    5. Mitral annular calcification is present.    6. The left atrium is mildly dilated in size.    7. The right ventricle is upper normal in size, with reduced systolic  Function.    Cardiac CT/MRI/Nuclear Tests:   None    6 Minute Walk:  None    Cardiopulmonary Stress Tests:   None               Other Past Medical History:  See below for the complete EPIC list of past medical and surgical history.      Allergies:  Nitrofurantoin and Lipitor [atorvastatin]    Current Medications:  Current Outpatient Medications   Medication Sig Dispense Refill    acetaminophen (TYLENOL) 500 MG tablet Take 2 tablets (1,000 mg total) by mouth Three (3) times a day.      albuterol HFA 90 mcg/actuation inhaler Inhale 2 puffs every eight (8) hours as needed for wheezing.      amiodarone (PACERONE) 200 MG tablet TAKE ONE TABLET BY MOUTH ONCE DAILY 90 tablet 3    colchicine (COLCRYS) 0.6 mg tablet Take 0.3 mg (1/2 tablet) at the first sign of a gout flare. OK to repeat 0.3mg  (1/2 tablet) after 3 days if symptoms have not resolved, then every 3 days until flare resolves. 30 tablet 1    cranberry 500 mg cap Take 500 mg by mouth daily with evening meal.      ergocalciferol-1,250 mcg, 50,000 unit, (DRISDOL) 1,250 mcg (50,000 unit) capsule Take 1 capsule (1,250 mcg total) by mouth once a week. 12 capsule 3    estradioL (ESTRACE) 0.01 % (0.1 mg/gram) vaginal cream Insert 2 g into the vagina Two (2) times a week. 42.5 g 3    ferrous sulfate 325 (65 FE) MG EC tablet Take 1 tablet (325 mg total) by mouth Every Monday, Wednesday, and Friday. 36 tablet 3    fluticasone propion-salmeterol (ADVAIR HFA) 115-21 mcg/actuation inhaler Inhale 2 puffs two (2) times a day. 12 g 11    gabapentin (NEURONTIN) 100 MG capsule Take 1 capsule (100 mg total) by mouth two (2) times a day. 60 capsule 11    metOLazone (ZAROXOLYN) 5 MG tablet Take 1 tablet (5 mg total) by mouth daily as needed (up to 3x weekly when instructed by cardiology clinic). 12 tablet 1    mirtazapine (REMERON) 7.5 MG tablet Take 7.5mg  (1 tablet) nightly for 1 week, then increase to 15mg  (2 tablets) 60 tablet 1    montelukast (SINGULAIR) 10 mg tablet Take 1 tablet (10 mg total) by mouth every morning. 90 tablet 2    NARCAN 4 mg/actuation nasal spray 1 spray into alternating nostrils once as needed (opioid overdose). PRN - Emergency use.      NON FORMULARY APPLY FROM NECK DOWN TWICE A DAY (Patient not taking: Reported on 09/06/2023)      OXYCONTIN 10 mg TR12 12 hr crush resistant ER/CR tablet Take 1 tablet (10 mg total) by mouth every twelve (12) hours.      OXYGEN-AIR DELIVERY SYSTEMS MISC 5 L by Miscellaneous route. Currently using 3  L/min via [place]      pantoprazole (PROTONIX) 20 MG tablet TAKE 1 TABLET(20 MG) BY MOUTH DAILY 90 tablet 3    rosuvastatin (CRESTOR) 5 MG tablet TAKE 1 TABLET EVERY OTHER DAY 24 tablet 0    tafamidis (VYNDAMAX) 61 mg cap Take 1 capsule (61 mg) by  mouth daily. 30 capsule 11    tiotropium bromide (SPIRIVA RESPIMAT) 2.5 mcg/actuation inhalation mist Inhale 2 puffs daily. 4 g 11    torsemide (DEMADEX) 20 MG tablet Take 3 tablets (60 mg total) by mouth daily.      vitamin A-3,000 mcg RAE, 10,000 UNIT, 3,000 mcg RAE (10,000 UNIT) capsule Take 1 capsule (3,000 mcg of RAE total) by mouth daily.      vutrisiran (AMVUTTRA) 25 mg/0.5 mL injection Inject 0.5 mL (25 mg total) under the skin once. Every 12 Weeks       No current facility-administered medications for this visit.       Family History:  The patient's family history includes Cancer in her father; Hypertension in her mother; No Known Problems in her brother, brother, maternal aunt, maternal grandfather, maternal grandmother, maternal uncle, paternal aunt, paternal grandfather, paternal grandmother, paternal uncle, sister, sister, and another family member.    Social history:  She  reports that she quit smoking about 5 years ago. Her smoking use included cigarettes. She has never used smokeless tobacco. She reports that she does not drink alcohol and does not use drugs.    Review of Systems:  As per HPI.  Rest of the review of ten systems is negative or unremarkable except as stated above.    Physical Exam:  VITAL SIGNS:   Vitals:    09/23/23 1412   BP: 144/61   Pulse: 67   SpO2: 94%           Wt Readings from Last 3 Encounters:   09/23/23 93.9 kg (207 lb)   09/23/23 94 kg (207 lb 3.2 oz)   09/22/23 92.1 kg (203 lb)      Today's Body mass index is 33.44 kg/m??.   Height: 167.6 cm (5' 6)  CONSTITUTIONAL: well-appearing in no acute distress  EYES: Conjunctivae and sclerae clear and anicteric.  ENT: Benign.   CARDIOVASCULAR: JVP not seen above the clavicle with HOB at 90 degrees. Rate and rhythm are regular.  There is no lifts or heaves.  Normal S1, S2. There is no murmur, gallops or rubs.  Radial and pedal pulses are 2+, bilaterally.   There is no edema to ankles, bilaterally.   RESPIRATORY: Decreased breath sounds at bases.  There are no wheezes.  GASTROINTESTINAL: Soft, non-tender, with audible bowel sounds. Abdomen nondistended.  Liver is nonpalpable.  SKIN: No rashes, ecchymosis or petechiae.  Warm, well perfused.   MUSCULOSKELETAL:  no joint swelling   NEURO/PSYCH: Appropriate mood and affect. Alert and oriented to person, place, and time. No gross motor or sensory deficits evident.    Pertinent Laboratory Studies:   Office Visit on 09/23/2023   Component Date Value Ref Range Status    Sodium 09/23/2023 131 (L)  135 - 145 mmol/L Final    Potassium 09/23/2023 4.7  3.4 - 4.8 mmol/L Final    Chloride 09/23/2023 98  98 - 107 mmol/L Final    CO2 09/23/2023 23.4  20.0 - 31.0 mmol/L Final    Anion Gap 09/23/2023 10  5 - 14 mmol/L Final    BUN 09/23/2023 100 (H)  9 - 23 mg/dL Final    Creatinine 16/09/9603 4.46 (H)  0.55 - 1.02 mg/dL Final    BUN/Creatinine Ratio 09/23/2023 22   Final    eGFR [name]-EPI (2021) Female 09/23/2023 9 (L)  >=60 mL/min/1.38m2 Final    Glucose 09/23/2023 190 (H)  70 - 179 mg/dL Final    Calcium 54/08/8118 9.5  8.7 - 10.4 mg/dL Final    PRO-BNP 69/62/9528 17,133.0 (H)  <=300.0 pg/mL Final    Magnesium 09/23/2023 2.4  1.6 - 2.6 mg/dL Final    Free T4 41/32/4401 1.39  0.89 - 1.76 ng/dL Final    TSH 02/72/5366 2.346  0.550 - 4.780 uIU/mL Final   Appointment on 09/16/2023   Component Date Value Ref Range Status    Magnesium 09/16/2023 2.4  1.6 - 2.6 mg/dL Final    Sodium 44/02/4741 136  135 - 145 mmol/L Final    Potassium 09/16/2023 4.7  3.4 - 4.8 mmol/L Final    Chloride 09/16/2023 103  98 - 107 mmol/L Final    CO2 09/16/2023 22.1  20.0 - 31.0 mmol/L Final    Anion Gap 09/16/2023 11  5 - 14 mmol/L Final    BUN 09/16/2023 89 (H)  9 - 23 mg/dL Final    Creatinine 59/56/3875 3.11 (H)  0.55 - 1.02 mg/dL Final    BUN/Creatinine Ratio 09/16/2023 29   Final    eGFR [name]-EPI (2021) Female 09/16/2023 15 (L)  >=60 mL/min/1.74m2 Final    Glucose 09/16/2023 172  70 - 179 mg/dL Final    Calcium 64/33/2951 9.4  8.7 - 10.4 mg/dL Final    PRO-BNP 88/41/6606 14,492.0 (H)  <=300.0 pg/mL Final   Office Visit on 09/06/2023   Component Date Value Ref Range Status    EKG Ventricular Rate 09/06/2023 69  BPM Final    EKG Atrial Rate 09/06/2023 69  BPM Final    EKG P-R Interval 09/06/2023 194  ms Final    EKG QRS Duration 09/06/2023 114  ms Final    EKG Q-T Interval 09/06/2023 464  ms Final    EKG QTC Calculation 09/06/2023 497  ms Final    EKG Calculated P Axis 09/06/2023 75  degrees Final    EKG Calculated R Axis 09/06/2023 -65  degrees Final    EKG Calculated T Axis 09/06/2023 62  degrees Final    QTC Fredericia 09/06/2023 486  ms Final    Sodium 09/06/2023 136  135 - 145 mmol/L Final    Potassium 09/06/2023 4.3  3.4 - 4.8 mmol/L Final    Chloride 09/06/2023 100  98 - 107 mmol/L Final    CO2 09/06/2023 25.9  20.0 - 31.0 mmol/L Final    Anion Gap 09/06/2023 10  5 - 14 mmol/L Final    BUN 09/06/2023 84 (H)  9 - 23 mg/dL Final    Creatinine 30/16/0109 2.61 (H)  0.55 - 1.02 mg/dL Final    BUN/Creatinine Ratio 09/06/2023 32   Final    eGFR [name]-EPI (2021) Female 09/06/2023 18 (L)  >=60 mL/min/1.32m2 Final    Glucose 09/06/2023 248 (H)  70 - 179 mg/dL Final    Calcium 32/35/5732 10.0  8.7 - 10.4 mg/dL Final    Magnesium 20/25/4270 2.3  1.6 - 2.6 mg/dL Final    PRO-BNP 62/37/6283 4,575.0 (H)  <=300.0 pg/mL Final   Admission on 08/30/2023, Discharged on 09/02/2023   Component Date Value Ref Range Status    Sodium 08/30/2023 134 (L)  135 - 145 mmol/L Final    Potassium 08/30/2023 4.4  3.4 - 4.8 mmol/L Final    Chloride 08/30/2023 99  98 - 107 mmol/L Final    CO2 08/30/2023 24.3  20.0 - 31.0 mmol/L Final    Anion Gap 08/30/2023 11  5 - 14 mmol/L Final    BUN 08/30/2023 103 (H)  9 - 23 mg/dL Final  Creatinine 08/30/2023 3.06 (H)  0.55 - 1.02 mg/dL Final    BUN/Creatinine Ratio 08/30/2023 34   Final    eGFR [name]-EPI (2021) Female 08/30/2023 15 (L)  >=60 mL/min/1.23m2 Final    Glucose 08/30/2023 260 (H)  70 - 179 mg/dL Final    Calcium 16/09/9603 9.5  8.7 - 10.4 mg/dL Final    Albumin 54/08/8118 3.4  3.4 - 5.0 g/dL Final    Total Protein 08/30/2023 7.7  5.7 - 8.2 g/dL Final    Total Bilirubin 08/30/2023 0.4  0.3 - 1.2 mg/dL Final    AST 14/78/2956 31  <=34 U/L Final    ALT 08/30/2023 15  10 - 49 U/L Final    Alkaline Phosphatase 08/30/2023 197 (H)  46 - 116 U/L Final    PT 08/30/2023 13.2 (H)  9.9 - 12.6 sec Final    INR 08/30/2023 1.20   Final    hsTroponin I 08/30/2023 164 (HH)  <=34 ng/L Final    EKG Ventricular Rate 08/30/2023 81  BPM Final    EKG Atrial Rate 08/30/2023 83  BPM Final    EKG QRS Duration 08/30/2023 118  ms Final    EKG Q-T Interval 08/30/2023 408  ms Final    EKG QTC Calculation 08/30/2023 473  ms Final    EKG Calculated R Axis 08/30/2023 268  degrees Final    EKG Calculated T Axis 08/30/2023 63  degrees Final    QTC Fredericia 08/30/2023 450  ms Final    WBC 08/30/2023 9.0  3.6 - 11.2 10*9/L Final    RBC 08/30/2023 4.24  3.95 - 5.13 10*12/L Final    HGB 08/30/2023 12.8  11.3 - 14.9 g/dL Final    HCT 21/30/8657 39.1  34.0 - 44.0 % Final    MCV 08/30/2023 92.3 77.6 - 95.7 fL Final    MCH 08/30/2023 30.2  25.9 - 32.4 pg Final    MCHC 08/30/2023 32.7  32.0 - 36.0 g/dL Final    RDW 84/69/6295 14.5  12.2 - 15.2 % Final    MPV 08/30/2023 8.6  6.8 - 10.7 fL Final    Platelet 08/30/2023 320  150 - 450 10*9/L Final    nRBC 08/30/2023 0  <=4 /100 WBCs Final    Neutrophils % 08/30/2023 79.1  % Final    Lymphocytes % 08/30/2023 6.8  % Final    Monocytes % 08/30/2023 12.3  % Final    Eosinophils % 08/30/2023 1.0  % Final    Basophils % 08/30/2023 0.8  % Final    Absolute Neutrophils 08/30/2023 7.1  1.8 - 7.8 10*9/L Final    Absolute Lymphocytes 08/30/2023 0.6 (L)  1.1 - 3.6 10*9/L Final    Absolute Monocytes 08/30/2023 1.1 (H)  0.3 - 0.8 10*9/L Final    Absolute Eosinophils 08/30/2023 0.1  0.0 - 0.5 10*9/L Final    Absolute Basophils 08/30/2023 0.1  0.0 - 0.1 10*9/L Final    hsTroponin I 08/30/2023 162 (HH)  <=34 ng/L Final    delta hsTroponin I 08/30/2023 2  <=7 ng/L Final    Specimen Source 08/30/2023 Venous   Final    FIO2 Venous 08/30/2023 Not Specified   Final    pH, Venous 08/30/2023 7.38  7.32 - 7.43 Final    pCO2, Ven 08/30/2023 48  40 - 60 mm Hg Final    pO2, Ven 08/30/2023 41 (H)  35 - 40 mm Hg Final    HCO3, Ven 08/30/2023 26  22 - 27  mmol/L Final    Base Excess, Ven 08/30/2023 2.9 (H)  -2.0 - 2.0 Final    O2 Saturation, Venous 08/30/2023 73.3  40.0 - 85.0 % Final    BNP 08/30/2023 450.68 (H)  <=100 pg/mL Final    SARS-CoV-2 PCR 08/30/2023 Negative  Negative Final    Influenza A 08/30/2023 Negative  Negative Final    Influenza B 08/30/2023 Negative  Negative Final    RSV 08/30/2023 Negative  Negative Final    hsTroponin I 08/31/2023 173 (HH)  <=34 ng/L Final    delta hsTroponin I 08/31/2023 11 (H)  <=7 ng/L Final    Color, UA 08/31/2023 Light Yellow   Final    Clarity, UA 08/31/2023 Clear   Final    Specific Gravity, UA 08/31/2023 1.013  1.003 - 1.030 Final    pH, UA 08/31/2023 5.0  5.0 - 9.0 Final    Leukocyte Esterase, UA 08/31/2023 Small (A)  Negative Final    Nitrite, UA 08/31/2023 Negative  Negative Final    Protein, UA 08/31/2023 Negative  Negative Final    Glucose, UA 08/31/2023 Negative  Negative Final    Ketones, UA 08/31/2023 Negative  Negative Final    Urobilinogen, UA 08/31/2023 <2.0 mg/dL  <4.5 mg/dL Final    Bilirubin, UA 08/31/2023 Negative  Negative Final    Blood, UA 08/31/2023 Trace (A)  Negative Final    RBC, UA 08/31/2023 <1  <=4 /HPF Final    WBC, UA 08/31/2023 3  0 - 5 /HPF Final    Squam Epithel, UA 08/31/2023 2  0 - 5 /HPF Final    Bacteria, UA 08/31/2023 Moderate (A)  None Seen /HPF Final    Hyaline Casts, UA 08/31/2023 6 (H)  0 - 1 /LPF Final    Mucus, UA 08/31/2023 Rare (A)  None Seen /HPF Final    Creat U 08/31/2023 75.3  Undefined mg/dL Final    Protein, Ur 40/98/1191 7.4  Undefined mg/dL Final    Protein/Creatinine Ratio, Urine 08/31/2023 0.098  Undefined Final    WBC 08/31/2023 8.2  3.6 - 11.2 10*9/L Final    RBC 08/31/2023 3.95  3.95 - 5.13 10*12/L Final    HGB 08/31/2023 11.9  11.3 - 14.9 g/dL Final    HCT 47/82/9562 36.5  34.0 - 44.0 % Final    MCV 08/31/2023 92.5  77.6 - 95.7 fL Final    MCH 08/31/2023 30.1  25.9 - 32.4 pg Final    MCHC 08/31/2023 32.6  32.0 - 36.0 g/dL Final    RDW 13/07/6577 14.8  12.2 - 15.2 % Final    MPV 08/31/2023 8.4  6.8 - 10.7 fL Final    Platelet 08/31/2023 269  150 - 450 10*9/L Final    Magnesium 08/31/2023 2.3  1.6 - 2.6 mg/dL Final    Phosphorus 46/96/2952 4.5  2.4 - 5.1 mg/dL Final    Sodium 84/13/2440 140  135 - 145 mmol/L Final    Potassium 08/31/2023 3.9  3.4 - 4.8 mmol/L Final    Chloride 08/31/2023 101  98 - 107 mmol/L Final    CO2 08/31/2023 26.2  20.0 - 31.0 mmol/L Final    Anion Gap 08/31/2023 13  5 - 14 mmol/L Final    BUN 08/31/2023 99 (H)  9 - 23 mg/dL Final    Creatinine 10/24/2535 2.82 (H)  0.55 - 1.02 mg/dL Final    BUN/Creatinine Ratio 08/31/2023 35   Final    eGFR [name]-EPI (2021) Female 08/31/2023 16 (L)  >=  60 mL/min/1.41m2 Final    Glucose 08/31/2023 132  70 - 179 mg/dL Final    Calcium 30/86/5784 9.4 8.7 - 10.4 mg/dL Final    Albumin 69/62/9528 3.0 (L)  3.4 - 5.0 g/dL Final    Total Protein 08/31/2023 6.9  5.7 - 8.2 g/dL Final    Total Bilirubin 08/31/2023 0.5  0.3 - 1.2 mg/dL Final    AST 41/32/4401 28  <=34 U/L Final    ALT 08/31/2023 10  10 - 49 U/L Final    Alkaline Phosphatase 08/31/2023 174 (H)  46 - 116 U/L Final    Hemoglobin A1C 08/30/2023 5.8 (H)  4.8 - 5.6 % Final    Estimated Average Glucose 08/30/2023 120  mg/dL Final    TSH 02/72/5366 0.963  0.550 - 4.780 uIU/mL Final    T3, Free 08/31/2023 1.77 (L)  2.30 - 4.20 pg/mL Final    Free T4 08/31/2023 1.79 (H)  0.89 - 1.76 ng/dL Final    Cortisol 44/02/4741 41.5  See Comment ug/dL Final    Glucose, POC 59/56/3875 150  70 - 179 mg/dL Final    Glucose, POC 64/33/2951 220 (H)  70 - 179 mg/dL Final    Glucose, POC 88/41/6606 172  70 - 179 mg/dL Final    Glucose, POC 30/16/0109 168  70 - 179 mg/dL Final    Sodium 32/35/5732 138  135 - 145 mmol/L Final    Potassium 09/01/2023 4.5  3.4 - 4.8 mmol/L Final    Chloride 09/01/2023 101  98 - 107 mmol/L Final    CO2 09/01/2023 28.8  20.0 - 31.0 mmol/L Final    Anion Gap 09/01/2023 8  5 - 14 mmol/L Final    BUN 09/01/2023 97 (H)  9 - 23 mg/dL Final    Creatinine 20/25/4270 2.87 (H)  0.55 - 1.02 mg/dL Final    BUN/Creatinine Ratio 09/01/2023 34   Final    eGFR [name]-EPI (2021) Female 09/01/2023 16 (L)  >=60 mL/min/1.89m2 Final    Glucose 09/01/2023 132  70 - 179 mg/dL Final    Calcium 62/37/6283 9.5  8.7 - 10.4 mg/dL Final    Magnesium 15/17/6160 2.3  1.6 - 2.6 mg/dL Final    WBC 73/71/0626 9.6  3.6 - 11.2 10*9/L Final    RBC 09/01/2023 3.97  3.95 - 5.13 10*12/L Final    HGB 09/01/2023 11.8  11.3 - 14.9 g/dL Final    HCT 94/85/4627 36.6  34.0 - 44.0 % Final    MCV 09/01/2023 92.2  77.6 - 95.7 fL Final    MCH 09/01/2023 29.7  25.9 - 32.4 pg Final    MCHC 09/01/2023 32.2  32.0 - 36.0 g/dL Final    RDW 03/50/0938 14.8  12.2 - 15.2 % Final    MPV 09/01/2023 8.5  6.8 - 10.7 fL Final    Platelet 09/01/2023 289  150 - 450 10*9/L Final    Glucose, POC 09/01/2023 144  70 - 179 mg/dL Final    Glucose, POC 18/29/9371 240 (H)  70 - 179 mg/dL Final    Glucose, POC 69/67/8938 142  70 - 179 mg/dL Final    Glucose, POC 10/13/5101 190 (H)  70 - 179 mg/dL Final    Sodium 58/52/7782 139  135 - 145 mmol/L Final    Potassium 09/02/2023 4.4  3.4 - 4.8 mmol/L Final    Chloride 09/02/2023 101  98 - 107 mmol/L Final    CO2 09/02/2023 26.1  20.0 - 31.0 mmol/L Final  Anion Gap 09/02/2023 12  5 - 14 mmol/L Final    BUN 09/02/2023 89 (H)  9 - 23 mg/dL Final    Creatinine 12/30/7251 3.20 (H)  0.55 - 1.02 mg/dL Final    BUN/Creatinine Ratio 09/02/2023 28   Final    eGFR [name]-EPI (2021) Female 09/02/2023 14 (L)  >=60 mL/min/1.2m2 Final    Glucose 09/02/2023 125  70 - 179 mg/dL Final    Calcium 66/44/0347 9.7  8.7 - 10.4 mg/dL Final    Magnesium 42/59/5638 2.1  1.6 - 2.6 mg/dL Final    Glucose, POC 75/64/3329 153  70 - 179 mg/dL Final    Glucose, POC 51/88/4166 251 (H)  70 - 179 mg/dL Final   Office Visit on 08/27/2023   Component Date Value Ref Range Status    BNP 08/27/2023 324.32 (H)  <=100 pg/mL Final    Sodium 08/27/2023 133 (L)  135 - 145 mmol/L Final    Potassium 08/27/2023 4.3  3.4 - 4.8 mmol/L Final    Chloride 08/27/2023 95 (L)  98 - 107 mmol/L Final    CO2 08/27/2023 28.2  20.0 - 31.0 mmol/L Final    Anion Gap 08/27/2023 10  5 - 14 mmol/L Final    BUN 08/27/2023 96 (H)  9 - 23 mg/dL Final    Creatinine 06/26/1600 3.41 (H)  0.55 - 1.02 mg/dL Final    BUN/Creatinine Ratio 08/27/2023 28   Final    eGFR [name]-EPI (2021) Female 08/27/2023 13 (L)  >=60 mL/min/1.36m2 Final    Glucose 08/27/2023 250 (H)  70 - 179 mg/dL Final    Calcium 09/32/3557 9.6  8.7 - 10.4 mg/dL Final    Magnesium 32/20/2542 2.2  1.6 - 2.6 mg/dL Final   No results displayed because visit has over 200 results.      Office Visit on 07/30/2023   Component Date Value Ref Range Status    Joint Fluid Culture 07/30/2023 NO GROWTH   Final    Gram Stain Result 07/30/2023 Direct Specimen Gram Stain   Final    Gram Stain Result 07/30/2023 No polymorphonuclear leukocytes seen   Final    Gram Stain Result 07/30/2023 No organisms seen   Final    Fluid Type 07/30/2023 Fluid, Joint   Final    Color, Fluid 07/30/2023 Brown   Final    Appearance, Fluid 07/30/2023 Cloudy   Final    Nucleated Cells, Fluid 07/30/2023 1,150  Undefined ul Final    RBC, Fluid 07/30/2023 5,975  ul Final    Neutrophil %, Fluid 07/30/2023 39.0  % Final    Lymphocytes %, Fluid 07/30/2023 1.0  % Final    Mono/Macro % , Fluid 07/30/2023 60.0  % Final    #Cells Counted BF Diff 07/30/2023 100   Final    Crystal Analysis 07/30/2023 Crystals present (AA)  No crystals seen Final   Office Visit on 07/14/2023   Component Date Value Ref Range Status    Sodium 07/14/2023 139  135 - 145 mmol/L Final    Potassium 07/14/2023 4.4  3.4 - 4.8 mmol/L Final    Chloride 07/14/2023 102  98 - 107 mmol/L Final    CO2 07/14/2023 27.7  20.0 - 31.0 mmol/L Final    Anion Gap 07/14/2023 9  5 - 14 mmol/L Final    BUN 07/14/2023 58 (H)  9 - 23 mg/dL Final    Creatinine 70/62/3762 2.50 (H)  0.55 - 1.02 mg/dL Final    BUN/Creatinine Ratio 07/14/2023 23  Final    eGFR [name]-EPI (2021) Female 07/14/2023 19 (L)  >=60 mL/min/1.38m2 Final    Glucose 07/14/2023 158  70 - 179 mg/dL Final    Calcium 81/19/1478 9.6  8.7 - 10.4 mg/dL Final    Albumin 29/56/2130 3.4  3.4 - 5.0 g/dL Final    Total Protein 07/14/2023 7.6  5.7 - 8.2 g/dL Final    Total Bilirubin 07/14/2023 0.4  0.3 - 1.2 mg/dL Final    AST 86/57/8469 16  <=34 U/L Final    ALT 07/14/2023 8 (L)  10 - 49 U/L Final    Alkaline Phosphatase 07/14/2023 132 (H)  46 - 116 U/L Final    BNP 07/14/2023 118.62 (H)  <=100 pg/mL Final    Magnesium 07/14/2023 2.3  1.6 - 2.6 mg/dL Final    TSH 62/95/2841 1.376  0.550 - 4.780 uIU/mL Final    EKG Ventricular Rate 07/14/2023 69  BPM Final    EKG Atrial Rate 07/14/2023 69  BPM Final    EKG P-R Interval 07/14/2023 188  ms Final    EKG QRS Duration 07/14/2023 108  ms Final    EKG Q-T Interval 07/14/2023 458  ms Final    EKG QTC Calculation 07/14/2023 490  ms Final    EKG Calculated P Axis 07/14/2023 22  degrees Final    EKG Calculated R Axis 07/14/2023 131  degrees Final    EKG Calculated T Axis 07/14/2023 -9  degrees Final    QTC Fredericia 07/14/2023 480  ms Final    Vitamin D Total (25OH) 07/14/2023 33.7  20.0 - 80.0 ng/mL Final       Lab Results   Component Value Date    PRO-BNP 17,133.0 (H) 09/23/2023    PRO-BNP 14,492.0 (H) 09/16/2023    PRO-BNP 4,575.0 (H) 09/06/2023    Creatinine 4.46 (H) 09/23/2023    Creatinine 3.11 (H) 09/16/2023    Creatinine 0.79 01/06/2011    BUN 100 (H) 09/23/2023    BUN 89 (H) 09/16/2023    BUN 20 01/06/2011    Sodium 131 (L) 09/23/2023    Sodium 140 01/06/2011    Potassium 4.7 09/23/2023    Potassium 4.1 01/06/2011    CO2 23.4 09/23/2023    CO2 30 01/06/2011    Magnesium 2.4 09/23/2023    Magnesium 2.0 01/06/2011    Total Bilirubin 0.5 08/31/2023    INR 1.20 08/30/2023    INR 1.0 01/06/2011       Lab Results   Component Value Date    Digoxin Level 0.9 10/01/2020       Lab Results   Component Value Date    TSH 2.346 09/23/2023    Cholesterol 95 12/06/2021    Triglycerides 66 12/06/2021    HDL 42 12/06/2021    Non-HDL Cholesterol 53 (L) 12/06/2021    LDL Calculated 40 12/06/2021       Lab Results   Component Value Date    WBC 9.6 09/01/2023    WBC 12.5 (H) 01/06/2011    HGB 11.8 09/01/2023    HGB 12.9 01/06/2011    HCT 36.6 09/01/2023    HCT 40.8 01/06/2011    Platelet 289 09/01/2023    Platelet 260 01/06/2011       Pertinent Test Results from Today:  None    Other pertinent records were reviewed.    The following are further history from the patient's EPIC record for reference:     Past Medical History:   Diagnosis Date  Acute kidney injury superimposed on chronic kidney disease (CMS-HCC) 10/11/2020    Acute on chronic diastolic (congestive) heart failure (CMS-HCC) 08/23/2020    Acute on chronic diastolic congestive heart failure (CMS-HCC)     AKI (acute kidney injury) (CMS-HCC) 04/14/2015    Lab Results  Component  Value  Date     CREATININE  1.90 (H)  06/12/2021     Had a bump in her creatinine when she was taking her diuretics every day.  She is currently taking 40 mg daily of torsemide and 50 mg of spironolactone.  Her volume status is fragile.  Previously when she stopped her diuretic she becomes short of breath.  Plan: We will check her BMP today.  We will likely have to go to 40    Anemia 08/22/2019    Iron deficiency anemia          Lab Results      Component    Value    Date           WBC    9.2    03/21/2021           RBC    3.67 (L)    03/21/2021           HGB    9.9 (L)    03/21/2021           HCT    30.7 (L)    03/21/2021           MCV    83.7    03/21/2021           MCH    26.9    03/21/2021           MCHC    32.1    03/21/2021           RDW    21.9 (H)    03/21/2021           PLT    318     Arthritis     Calculus of kidney     Calculus of ureter     CHF (congestive heart failure) (CMS-HCC)     Chronic atrial fibrillation (CMS-HCC) 07/20/2019    COPD (chronic obstructive pulmonary disease) (CMS-HCC)     Diabetes (CMS-HCC)     Gangrenous cholecystitis 10/11/2020    Generalized edema  06/17/2021    GERD (gastroesophageal reflux disease)     Hydronephrosis     Hypertension     Hyponatremia 10/11/2020    Intermediate coronary syndrome (CMS-HCC) 03/13/2014    Lower extremity edema 09/28/2020    Lumbar stenosis     Microscopic hematuria     Nausea alone     Nephrolithiasis 04/17/2016    Neuropathy     Nocturia     Nocturia 07/01/2017    Other chronic cystitis     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  See plans for anemia, AKI  We are also tapering her gabapentin.  Currently on 300 mg 3 times daily.  We will decrease it to twice daily, and then nightly.  She states she is not having any recurrence of her pain.    Pulmonary hypertension (CMS-HCC)     Renal colic     Shortness of breath 04/28/2020    Sleep apnea     Unstable angina pectoris (CMS-HCC) 03/13/2014       Past Surgical History:   Procedure Laterality Date    BACK SURGERY  1995    CARPAL TUNNEL RELEASE Left 2014    HYSTERECTOMY  1971    IR INSERT CHOLECYSTOSMY TUBE PERCUTANEOUS  10/02/2020    IR INSERT CHOLECYSTOSMY TUBE PERCUTANEOUS 10/02/2020 Braulio Conte, MD IMG VIR [facility]&V [facility]    LUMBAR DISC SURGERY      PR REMOVAL GALLBLADDER N/A 10/06/2020    Procedure: CHOLECYSTECTOMY;  Surgeon: Katherina Mires, MD;  Location: MAIN OR [facility];  Service: Trauma    PR RIGHT HEART CATH O2 SATURATION & CARDIAC OUTPUT N/A 09/30/2020    Procedure: Right Heart Catheterization;  Surgeon: Neal Dy, MD;  Location: [facility] CATH;  Service: Cardiology    PR RIGHT HEART CATH O2 SATURATION & CARDIAC OUTPUT N/A 01/09/2021    Procedure: Right Heart Catheterization;  Surgeon: Lesle Reek, MD;  Location: [facility] CATH;  Service: Cardiology

## 2023-09-23 NOTE — Unmapped (Signed)
Hello referring Provider: Artelia Laroche, MD     PCP:  Artelia Laroche, MD      09/23/2023      ASSESSMENT/PLAN:      [name] is a 82 y.o. year old patient with a past medical history significant for diabetes mellitus, hypertension, COPD, cardiac amyloidosis.  She is being seen in follow-up for chronic kidney disease.     Data today: BUN 100, creatinine 4.46, GFR 9, potassium 4.7    1.  Chronic kidney disease/ Acute kidney injury  Based on labs today, patient has stage V [name] versus acute kidney injury.  She is azotemic and uremic based on asterixis.  She also demonstrates volume overload from chest x-ray though on a stable amount of supplemental oxygen, 3 L nasal cannula.  Her previous baseline and relatively good health was a GFR of approximately 20 mL/min.  Given the decline in kidney function and progressive pulmonary edema, the patient likely has cardiorenal syndrome.    I reviewed the labs with the patient and the patient's daughter.  I expressed my concern about the rising BUN and declining GFR.  This would make continued diuresis difficult without causing a progressive rise in the BUN.  I informed the patient and family that I would discuss with her cardiology team to determine next steps.  She is hesitant to return to the hospital given her 2 recent hospital stays.  If she were to return, a cardiology heart failure service would be appropriate.    2.  Diabetes mellitus type 2  She is currently on insulin.     3.  Hypertension  This is currently managed on the current regimen of spironolactone, torsemide, and metolazone as needed.      4.  Cardiac amyloidosis  This appears to be stable, and is managed by cardiology.    5.  Atrial fibrillation  This appears to be stable.  She is on apixaban and amiodarone.    6.  Chronic obstructive pulmonary disease, O2 dependent  Stable.    7.  Anemia  Most recent hemoglobin is 11.9.  She would not be a candidate for ESA based on this level      [name] will follow up in 3-4 months.       I personally spent 41 minutes face-to-face and non-face-to-face in the care of this patient, which includes all pre, intra, and post visit time on the date of service.             Background: Patient has chronic kidney disease stage IV plan latest EGFR= 92ml/min and cardiac amyloid.  Etiology is not clear, but risk factors include hypertension and diabetes mellitus.  She does not have any proteinuria or albuminuria.  The lack of albuminuria can be seen in 30% of patients with diabetes and low GFR.      HPI:  [name] to the [facility] nephrology clinic for scheduled follow-up.  I last saw her about 12 months ago.  She is again accompanied by her daughter today.    Both the daughter and patient report that the patient has not been doing well for the last 2 months.  She states that she was in her usual state of health until early August.  At that time, she developed left arm pain and was found to have an effusion of the left wrist.  Fluid is aspirated and showed monosodium urate crystals consistent with gouty arthritis.  She was prescribed a Medrol dose  pack by orthopedics.  Approximately 2 weeks later she was admitted at [facility] with new dyspnea.  Cardiology was consulted and recommended continuing torsemide 60 mg and spironolactone 25 mg daily and will set up follow up for patient in diuresis clinic, and recommend renal dosing of colchicine for acute gout flares.  Chest x-ray showed mild pulmonary edema.      She was readmitted to [facility] on 08/31/2023 with recurrent dyspnea.  Chest x-ray again showed pulmonary edema.  She received [name] with Bumex and a decrease in her weight to 204 pounds.  On discharge, she was continued on torsemide and metolazone with spironolactone held.  In follow-up with cardiology, the spironolactone was restarted at 25 mg/day.    Since discharge home, she has been followed closely by cardiology. Because of continued dyspnea symptoms, the patient had an extra dose of torsemide 40 mg and metolazone 5 mg yesterday in addition to the 60 mg in the morning and 5 mg in the morning.  Today she had a visit with her cardiology team prior to visit with me.  She reports that she feels well today, but continues to have dyspnea, as well as fatigue.  This is confirmed by patient's daughter.  She reports a slight decrease in urine output.  She also reports somnolence and jerking movements.      Otherwise as per ROS      ROS:   CONSTITUTIONAL: denies fevers or chills, denies unintentional weight loss  CARDIOVASCULAR: denies chest pain, admits dyspnea on exertion, denies leg edema  GASTROINTESTINAL: denies nausea, denies vomiting, admits anorexia  GENITOURINARY: denies dysuria, denies hematuria, admits decreased urinary stream  All systems reviewed and are negative except as listed above.    PAST MEDICAL HISTORY:  Past Medical History:   Diagnosis Date    Acute kidney injury superimposed on chronic kidney disease (CMS-HCC) 10/11/2020    Acute on chronic diastolic (congestive) heart failure (CMS-HCC) 08/23/2020    Acute on chronic diastolic congestive heart failure (CMS-HCC)     AKI (acute kidney injury) (CMS-HCC) 04/14/2015    Lab Results  Component  Value  Date     CREATININE  1.90 (H)  06/12/2021     Had a bump in her creatinine when she was taking her diuretics every day.  She is currently taking 40 mg daily of torsemide and 50 mg of spironolactone.  Her volume status is fragile.  Previously when she stopped her diuretic she becomes short of breath.  Plan: We will check her BMP today.  We will likely have to go to 40    Anemia 08/22/2019    Iron deficiency anemia          Lab Results      Component    Value    Date           WBC    9.2    03/21/2021           RBC    3.67 (L)    03/21/2021           HGB    9.9 (L)    03/21/2021           HCT    30.7 (L)    03/21/2021           MCV    83.7    03/21/2021           MCH 26.9    03/21/2021  MCHC    32.1    03/21/2021           RDW    21.9 (H)    03/21/2021           PLT    318     Arthritis     Calculus of kidney     Calculus of ureter     CHF (congestive heart failure) (CMS-HCC)     Chronic atrial fibrillation (CMS-HCC) 07/20/2019    COPD (chronic obstructive pulmonary disease) (CMS-HCC)     Diabetes (CMS-HCC)     Gangrenous cholecystitis 10/11/2020    Generalized edema  06/17/2021    GERD (gastroesophageal reflux disease)     Hydronephrosis     Hypertension     Hyponatremia 10/11/2020    Intermediate coronary syndrome (CMS-HCC) 03/13/2014    Lower extremity edema 09/28/2020    Lumbar stenosis     Microscopic hematuria     Nausea alone     Nephrolithiasis 04/17/2016    Neuropathy     Nocturia     Nocturia 07/01/2017    Other chronic cystitis     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  See plans for anemia, AKI  We are also tapering her gabapentin.  Currently on 300 mg 3 times daily.  We will decrease it to twice daily, and then nightly.  She states she is not having any recurrence of her pain.    Pulmonary hypertension (CMS-HCC)     Renal colic     Shortness of breath 04/28/2020    Sleep apnea     Unstable angina pectoris (CMS-HCC) 03/13/2014            MEDICATIONS:  Current Outpatient Medications   Medication Sig Dispense Refill    acetaminophen (TYLENOL) 500 MG tablet Take 2 tablets (1,000 mg total) by mouth Three (3) times a day.      albuterol HFA 90 mcg/actuation inhaler Inhale 2 puffs every eight (8) hours as needed for wheezing.      amiodarone (PACERONE) 200 MG tablet TAKE ONE TABLET BY MOUTH ONCE DAILY 90 tablet 3    colchicine (COLCRYS) 0.6 mg tablet Take 0.3 mg (1/2 tablet) at the first sign of a gout flare. OK to repeat 0.3mg  (1/2 tablet) after 3 days if symptoms have not resolved, then every 3 days until flare resolves. 30 tablet 1    cranberry 500 mg cap Take 500 mg by mouth daily with evening meal. ergocalciferol-1,250 mcg, 50,000 unit, (DRISDOL) 1,250 mcg (50,000 unit) capsule Take 1 capsule (1,250 mcg total) by mouth once a week. 12 capsule 3    estradioL (ESTRACE) 0.01 % (0.1 mg/gram) vaginal cream Insert 2 g into the vagina Two (2) times a week. 42.5 g 3    ferrous sulfate 325 (65 FE) MG EC tablet Take 1 tablet (325 mg total) by mouth Every Monday, Wednesday, and Friday. 36 tablet 3    fluticasone propion-salmeterol (ADVAIR HFA) 115-21 mcg/actuation inhaler Inhale 2 puffs two (2) times a day. 12 g 11    gabapentin (NEURONTIN) 100 MG capsule Take 1 capsule (100 mg total) by mouth two (2) times a day. 60 capsule 11    metOLazone (ZAROXOLYN) 5 MG tablet Take 1 tablet (5 mg total) by mouth daily as needed (up to 3x weekly when instructed by cardiology clinic). 12 tablet 1    mirtazapine (REMERON) 7.5 MG tablet Take 7.5mg  (1 tablet) nightly for 1 week, then  increase to 15mg  (2 tablets) 60 tablet 1    montelukast (SINGULAIR) 10 mg tablet Take 1 tablet (10 mg total) by mouth every morning. 90 tablet 2    NARCAN 4 mg/actuation nasal spray 1 spray into alternating nostrils once as needed (opioid overdose). PRN - Emergency use.      NON FORMULARY APPLY FROM NECK DOWN TWICE A DAY (Patient not taking: Reported on 09/06/2023)      OXYCONTIN 10 mg TR12 12 hr crush resistant ER/CR tablet Take 1 tablet (10 mg total) by mouth every twelve (12) hours.      OXYGEN-AIR DELIVERY SYSTEMS MISC 5 L by Miscellaneous route. Currently using 3  L/min via [place]      pantoprazole (PROTONIX) 20 MG tablet TAKE 1 TABLET(20 MG) BY MOUTH DAILY 90 tablet 3    rosuvastatin (CRESTOR) 5 MG tablet TAKE 1 TABLET EVERY OTHER DAY 24 tablet 0    tafamidis (VYNDAMAX) 61 mg cap Take 1 capsule (61 mg) by mouth daily. 30 capsule 11    tiotropium bromide (SPIRIVA RESPIMAT) 2.5 mcg/actuation inhalation mist Inhale 2 puffs daily. 4 g 11    torsemide (DEMADEX) 20 MG tablet Take 3 tablets (60 mg total) by mouth daily.      vitamin A-3,000 mcg RAE, 10,000 UNIT, 3,000 mcg RAE (10,000 UNIT) capsule Take 1 capsule (3,000 mcg of RAE total) by mouth daily.      vutrisiran (AMVUTTRA) 25 mg/0.5 mL injection Inject 0.5 mL (25 mg total) under the skin once. Every 12 Weeks       No current facility-administered medications for this visit.       PHYSICAL EXAM:  Vitals:    09/23/23 1459   BP: 144/61   Pulse: 67   Temp: 35.9 ??C (96.6 ??F)     Wt Readings from Last 6 Encounters:   09/23/23 93.9 kg (207 lb)   09/23/23 94 kg (207 lb 3.2 oz)   09/22/23 92.1 kg (203 lb)   09/06/23 93 kg (205 lb)   09/02/23 92.6 kg (204 lb 1.6 oz)   08/27/23 92.7 kg (204 lb 4.8 oz)     BP Readings from Last 3 Encounters:   09/23/23 144/61   09/23/23 144/61   09/22/23 117/65         CONSTITUTIONAL: Alert, tired appearing, seated in wheelchair wearing oxygen  HEENT: Moist mucous membranes, oropharynx clear without erythema or exudate  EYES:  Pupils reactive, sclerae anicteric.  NECK: Supple  CARDIOVASCULAR: Regular, normal S1/S2 heart sounds, no murmurs, no rubs.   PULM: Decreased breath sounds at the right base  GASTROINTESTINAL: Soft, active bowel sounds, nontender  EXTREMITIES: Trace lower extremity edema bilaterally.   SKIN: No rashes or lesions  NEUROLOGIC: Asterixis present    MEDICAL DECISION MAKING  Urine dipstick: Not obtained    Urine microscopy: Did not view      Component      Latest Ref Rng 09/06/2023 09/16/2023 09/23/2023   Sodium      135 - 145 mmol/L 136  136  131 (L)    Potassium      3.4 - 4.8 mmol/L 4.3  4.7  4.7    Chloride      98 - 107 mmol/L 100  103  98    Anion Gap      5 - 14 mmol/L 10  11  10     CO2      20.0 - 31.0 mmol/L 25.9  22.1  23.4    Bun  9 - 23 mg/dL 84 (H)  89 (H)  161 (H)    Creatinine      0.55 - 1.02 mg/dL 0.96 (H)  0.45 (H)  4.09 (H)    BUN/Creatinine Ratio 32  29  22    Glucose      70 - 179 mg/dL 811 (H)  914  782 (H)    Calcium      8.7 - 10.4 mg/dL 95.6  9.4  9.5    eGFR [name]-EPI (2021) Female      >=60 mL/min/1.63m2 18 (L)  15 (L)  9 (L)

## 2023-09-23 NOTE — Unmapped (Addendum)
Medicines to Avoid With Kidney Disease: Care Instructions  Overview     Kidney disease means that your kidneys are not able to get rid of waste from the blood. So they can't keep your body's fluids and chemicals in balance. Usually, the kidneys get rid of waste from the blood through the urine. And they balance the fluids in the body.  When your kidneys don't work as they should, you have to be careful about some medicines. They may harm your kidneys. Your doctor may tell you not to take them or may change the dose.  Medicines for pain and swelling, such as ibuprofen (Advil or Motrin) or naproxen (Aleve), can cause harm. So can some antibiotics and antacids. And you need to be careful about some drugs that treat cancer, lower blood pressure, or get rid of water from the body. Some herbal products could cause harm too.  Follow-up care is a key part of your treatment and safety. Be sure to make and go to all appointments, and call your doctor if you are having problems. It's also a good idea to know your test results and keep a list of the medicines you take.  How can you care for yourself at home?  Tell your doctor all the prescription, herbal, or over-the-counter medicines you take. Do not take any new ones unless you talk to your doctor first.  Do not take anti-inflammatory medicines. These include ibuprofen (Advil, Motrin) and naproxen (Aleve). You can use acetaminophen (Tylenol) for pain.  Do not take two or more pain medicines at the same time unless the doctor told you to. Many pain medicines have acetaminophen, which is Tylenol. Too much acetaminophen (Tylenol) can be harmful.  Tell all doctors and others who work with your health care that you have kidney disease.        Dear Barbara Huber    It was good to see you.  As we discussed, your kidney function is lower than it has been recently.  The BUN level is at 100.  We will need to make some changes in your medications and recheck labs soon.  I would like to check a chest x-ray to help [place] figure out exactly how much fluid medication we need to give you over the next few days.    I will make an appointment for you to see me in about 5 weeks.  He will be in touch sooner than that to determine next steps.    Sincerely,    Dr. Kirtland Bouchard

## 2023-09-24 ENCOUNTER — Ambulatory Visit: Admit: 2023-09-24 | Discharge: 2023-10-01 | Payer: MEDICARE

## 2023-09-24 ENCOUNTER — Ambulatory Visit: Admit: 2023-09-24 | Payer: MEDICARE

## 2023-09-24 ENCOUNTER — Ambulatory Visit: Admit: 2023-09-24 | Discharge: 2023-09-25 | Payer: MEDICARE | Attending: Adult Health | Primary: Adult Health

## 2023-09-24 ENCOUNTER — Ambulatory Visit
Admit: 2023-09-24 | Discharge: 2023-10-01 | Disposition: A | Discharge status: Home Health | Payer: MEDICARE | Admitting: Student in an Organized Health Care Education/Training Program

## 2023-09-24 DIAGNOSIS — I5032 Chronic diastolic (congestive) heart failure: Principal | ICD-10-CM

## 2023-09-24 LAB — PRO-BNP: PRO-BNP: 26088 pg/mL — ABNORMAL HIGH (ref <=300.0)

## 2023-09-24 LAB — CBC W/ AUTO DIFF
BASOPHILS ABSOLUTE COUNT: 0 10*9/L (ref 0.0–0.1)
BASOPHILS RELATIVE PERCENT: 0.6 %
EOSINOPHILS ABSOLUTE COUNT: 0 10*9/L (ref 0.0–0.5)
EOSINOPHILS RELATIVE PERCENT: 0.4 %
HEMATOCRIT: 40.2 % (ref 34.0–44.0)
HEMOGLOBIN: 13 g/dL (ref 11.3–14.9)
LYMPHOCYTES ABSOLUTE COUNT: 0.5 10*9/L — ABNORMAL LOW (ref 1.1–3.6)
LYMPHOCYTES RELATIVE PERCENT: 7.9 %
MEAN CORPUSCULAR HEMOGLOBIN CONC: 32.3 g/dL (ref 32.0–36.0)
MEAN CORPUSCULAR HEMOGLOBIN: 30.4 pg (ref 25.9–32.4)
MEAN CORPUSCULAR VOLUME: 94 fL (ref 77.6–95.7)
MEAN PLATELET VOLUME: 8.8 fL (ref 6.8–10.7)
MONOCYTES ABSOLUTE COUNT: 0.7 10*9/L (ref 0.3–0.8)
MONOCYTES RELATIVE PERCENT: 9.6 %
NEUTROPHILS ABSOLUTE COUNT: 5.6 10*9/L (ref 1.8–7.8)
NEUTROPHILS RELATIVE PERCENT: 81.5 %
PLATELET COUNT: 144 10*9/L — ABNORMAL LOW (ref 150–450)
RED BLOOD CELL COUNT: 4.27 10*12/L (ref 3.95–5.13)
RED CELL DISTRIBUTION WIDTH: 15.1 % (ref 12.2–15.2)
WBC ADJUSTED: 6.9 10*9/L (ref 3.6–11.2)

## 2023-09-24 LAB — COMPREHENSIVE METABOLIC PANEL
ALBUMIN: 3.8 g/dL (ref 3.4–5.0)
ALKALINE PHOSPHATASE: 154 U/L — ABNORMAL HIGH (ref 46–116)
ALT (SGPT): 12 U/L (ref 10–49)
ANION GAP: 10 mmol/L (ref 5–14)
AST (SGOT): 24 U/L (ref <=34)
BILIRUBIN TOTAL: 0.5 mg/dL (ref 0.3–1.2)
BLOOD UREA NITROGEN: 89 mg/dL — ABNORMAL HIGH (ref 9–23)
BUN / CREAT RATIO: 17
CALCIUM: 9.6 mg/dL (ref 8.7–10.4)
CHLORIDE: 98 mmol/L (ref 98–107)
CO2: 20 mmol/L (ref 20.0–31.0)
CREATININE: 5.34 mg/dL — ABNORMAL HIGH
EGFR CKD-EPI (2021) FEMALE: 8 mL/min/{1.73_m2} — ABNORMAL LOW (ref >=60)
GLUCOSE RANDOM: 174 mg/dL (ref 70–179)
POTASSIUM: 4.8 mmol/L (ref 3.4–4.8)
PROTEIN TOTAL: 7.3 g/dL (ref 5.7–8.2)
SODIUM: 128 mmol/L — ABNORMAL LOW (ref 135–145)

## 2023-09-24 LAB — MAGNESIUM: MAGNESIUM: 2.4 mg/dL (ref 1.6–2.6)

## 2023-09-24 LAB — BLOOD GAS, VENOUS
BASE EXCESS VENOUS: -5.2 — ABNORMAL LOW (ref -2.0–2.0)
HCO3 VENOUS: 23 mmol/L (ref 22–27)
O2 SATURATION VENOUS: 60 % (ref 40.0–85.0)
PCO2 VENOUS: 52 mmHg (ref 40–60)
PH VENOUS: 7.25 — ABNORMAL LOW (ref 7.32–7.43)
PO2 VENOUS: 35 mmHg (ref 30–55)

## 2023-09-24 LAB — HIGH SENSITIVITY TROPONIN I - SERIAL: HIGH SENSITIVITY TROPONIN I: 185 ng/L (ref <=34)

## 2023-09-24 LAB — PROTIME-INR
INR: 1.04
PROTIME: 11.6 s (ref 9.9–12.6)

## 2023-09-24 LAB — HIGH SENSITIVITY TROPONIN I - 2 HOUR SERIAL
HIGH SENSITIVITY TROPONIN - DELTA (0-2H): 7 ng/L (ref <=7)
HIGH-SENSITIVITY TROPONIN I - 2 HOUR: 192 ng/L (ref <=34)

## 2023-09-24 LAB — HIGH SENSITIVITY TROPONIN I - 6 HOUR SERIAL
HIGH SENSITIVITY TROPONIN - DELTA (2-6H): 21 ng/L (ref <=7)
HIGH-SENSITIVITY TROPONIN I - 6 HOUR: 213 ng/L (ref <=34)

## 2023-09-24 MED ADMIN — furosemide (LASIX) injection 120 mg: 120 mg | INTRAVENOUS | @ 15:00:00 | Stop: 2023-09-24

## 2023-09-24 MED ADMIN — furosemide (LASIX) 120 mg in sodium chloride (NS) 0.9 % 50 mL IVPB: 120 mg | INTRAVENOUS | @ 23:00:00 | Stop: 2023-09-24

## 2023-09-24 MED ADMIN — furosemide 200 mg in sodium chloride 0.9 % 100 mL (2 mg/mL) infusion: 20 mg/h | INTRAVENOUS | @ 23:00:00

## 2023-09-24 NOTE — Unmapped (Addendum)
Pt states that she just left diuresis clinic, was told she should be admitted but there were no beds to direct admit, told to come to ED for admission. Pt reports fluid overload. Is on O2 at NF -- is on 3L at baseline, 3L at NF. Noted to be at 80% SpO2 on 3L, bumped up to 4L and she came up to 87%. Brought her up to 5L and into triage room to finish triage. Pt is warm and dry, denies dyspnea at rest.

## 2023-09-24 NOTE — Unmapped (Signed)
[facility]  Emergency Department Provider Note     ED Clinical Impression     Final diagnoses:   Acute pulmonary edema (CMS-HCC) (Primary)   Acute respiratory failure with hypoxia (CMS-HCC)      Impression, Medical Decision Making, ED Course     Impression: 82 y.o. female who has a past medical history of Acute kidney injury superimposed on chronic kidney disease (CMS-HCC) (10/11/2020), Acute on chronic diastolic (congestive) heart failure (CMS-HCC) (08/23/2020), Acute on chronic diastolic congestive heart failure (CMS-HCC), AKI (acute kidney injury) (CMS-HCC) (04/14/2015), Anemia (08/22/2019), Arthritis, Calculus of kidney, Calculus of ureter, CHF (congestive heart failure) (CMS-HCC), Chronic atrial fibrillation (CMS-HCC) (07/20/2019), COPD (chronic obstructive pulmonary disease) (CMS-HCC), Diabetes (CMS-HCC), Gangrenous cholecystitis (10/11/2020), Generalized edema  (06/17/2021), GERD (gastroesophageal reflux disease), Hydronephrosis, Hypertension, Hyponatremia (10/11/2020), Intermediate coronary syndrome (CMS-HCC) (03/13/2014), Lower extremity edema (09/28/2020), Lumbar stenosis, Microscopic hematuria, Nausea alone, Nephrolithiasis (04/17/2016), Neuropathy, Nocturia, Nocturia (07/01/2017), Other chronic cystitis, 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 who presents with shortness of breath hypoxia as described below.     DDx/MDM: Patient presents with vague shortness of breath/dyspnea.  Differential diagnosis is broad.  Will obtain troponin and ECG to evaluate for cardiac ischemia or other cardiac causes, proBNP to evaluate for congestive heart failure, CBC to evaluate for anemia, electrolyte panel to evaluate for renal dysfunction or other electrolyte abnormalities, and chest x-ray to evaluate for pneumonia, pneumothorax.  Patient was sent down from the [facility] [name] clinic for evaluation and admission for [name].  On arrival patient was notably hypoxic, crackles auscultated in the bilateral lower lung fields, patient was immediately transitioned from 15 L nonrebreather (which the patient arrived on) to BiPAP.    Diagnostic workup as below.     Orders Placed This Encounter   Procedures    Influenza/ RSV/COVID PCR    XR Chest Portable    Pro-BNP    CBC w/ Differential    Comprehensive Metabolic Panel    Magnesium    hsTroponin I (serial 0-2-6H w/ delta)    Protime-INR    Blood Gas, Venous    hsTroponin I - 2 Hour    hsTroponin I - 6 Hour    Adult BiPAP    ECG 12 Lead    ECG 12 Lead    ECG 12 Lead    ED Admit Decision       ED Course as of 09/24/23 1510   Fri Sep 24, 2023   1325 8/21    Summary    1. The left ventricle is normal in size with moderately increased wall  thickness.    2. 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    3. The left ventricular systolic function is hyperdynamic, LVEF is visually  estimated at >70%.    4. Mitral annular calcification is present (moderate).    5. The mitral valve leaflets are mildly thickened with normal leaflet  mobility.    6. The aortic valve is trileaflet with mildly thickened leaflets with normal  excursion.    7. The left atrium is moderately dilated in size.    8. The right ventricle is normal in size, with normal systolic function.     1418 PRO-BNP(!): 26,088.0   1418 hsTroponin I(!!): 185  Repeat pending   1419 XR Chest Portable  IMPRESSION:     Minimal change in portable imaging of chest relative to  similar imaging from 1 day prior. Notably, there is similar enlargement of the cardiac silhouette and persistent, but improved, pulmonary edema. Small effusions may contribute to opacification of the costophrenic angles.     1419 Negative viral swabs.  CMP with mild hyponatremia sodium of 128, creatinine 5.34 which is increased from most recent 1 day ago 4.46.  CBC with no significant leukocytosis.  VBG with a pH of 7.25, pCO2 52, pO2 35, HCO3 23   1421 Paged MAO for admission   1506 Signed out to oncoming physician pending team assignment.     MDM Elements  Discussion of Management with other Physicians, QHP or Appropriate Source: pending team  ____________________________________________    The case was discussed with the attending physician, who is in agreement with the above assessment and plan.      History     Chief Complaint  Chief Complaint   Patient presents with    Medical Problem     HPI   Barbara Huber is a 82 y.o. female with past medical history as below who presents with shortness of breath.  Sent down from [facility] [name] clinic.  Of diastolic heart failure, receiving [name] at the clinic for volume status optimization.  Echo 07/2023 with an EF of greater than 70%.  The patient received IV Lasix 120 mg at the clinic with poor response and thus was sent to the ED to be admitted.  Patient reports shortness of breath that has been progressive over the last several weeks, but denies chest pain, nausea, vomiting, diarrhea, fever, syncope, lightheadedness, dizziness, viral-like symptoms, fever.    Outside Historian(s): I have obtained additional history/collateral from family at bedside.    Past Medical History:   Diagnosis Date    Acute kidney injury superimposed on chronic kidney disease (CMS-HCC) 10/11/2020    Acute on chronic diastolic (congestive) heart failure (CMS-HCC) 08/23/2020    Acute on chronic diastolic congestive heart failure (CMS-HCC)     AKI (acute kidney injury) (CMS-HCC) 04/14/2015    Lab Results  Component  Value  Date     CREATININE  1.90 (H)  06/12/2021     Had a bump in her creatinine when she was taking her diuretics every day.  She is currently taking 40 mg daily of torsemide and 50 mg of spironolactone.  Her volume status is fragile.  Previously when she stopped her diuretic she becomes short of breath.  Plan: We will check her BMP today.  We will likely have to go to 40    Anemia 08/22/2019    Iron deficiency anemia          Lab Results      Component    Value    Date           WBC    9.2    03/21/2021           RBC    3.67 (L)    03/21/2021           HGB    9.9 (L)    03/21/2021           HCT    30.7 (L)    03/21/2021           MCV    83.7    03/21/2021           MCH    26.9    03/21/2021           MCHC  32.1    03/21/2021           RDW    21.9 (H)    03/21/2021           PLT    318     Arthritis     Calculus of kidney     Calculus of ureter     CHF (congestive heart failure) (CMS-HCC)     Chronic atrial fibrillation (CMS-HCC) 07/20/2019    COPD (chronic obstructive pulmonary disease) (CMS-HCC)     Diabetes (CMS-HCC)     Gangrenous cholecystitis 10/11/2020    Generalized edema  06/17/2021    GERD (gastroesophageal reflux disease)     Hydronephrosis     Hypertension     Hyponatremia 10/11/2020    Intermediate coronary syndrome (CMS-HCC) 03/13/2014    Lower extremity edema 09/28/2020    Lumbar stenosis     Microscopic hematuria     Nausea alone     Nephrolithiasis 04/17/2016    Neuropathy     Nocturia     Nocturia 07/01/2017    Other chronic cystitis     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  See plans for anemia, AKI  We are also tapering her gabapentin.  Currently on 300 mg 3 times daily.  We will decrease it to twice daily, and then nightly.  She states she is not having any recurrence of her pain.    Pulmonary hypertension (CMS-HCC)     Renal colic     Shortness of breath 04/28/2020    Sleep apnea     Unstable angina pectoris (CMS-HCC) 03/13/2014       Past Surgical History:   Procedure Laterality Date    BACK SURGERY  1995    CARPAL TUNNEL RELEASE Left 2014    HYSTERECTOMY  1971    IR INSERT CHOLECYSTOSMY TUBE PERCUTANEOUS  10/02/2020    IR INSERT CHOLECYSTOSMY TUBE PERCUTANEOUS 10/02/2020 Braulio Conte, MD IMG VIR H&V [facility]    LUMBAR DISC SURGERY      PR REMOVAL GALLBLADDER N/A 10/06/2020 Procedure: CHOLECYSTECTOMY;  Surgeon: Katherina Mires, MD;  Location: MAIN OR [facility];  Service: Trauma    PR RIGHT HEART CATH O2 SATURATION & CARDIAC OUTPUT N/A 09/30/2020    Procedure: Right Heart Catheterization;  Surgeon: Neal Dy, MD;  Location: [facility] CATH;  Service: Cardiology    PR RIGHT HEART CATH O2 SATURATION & CARDIAC OUTPUT N/A 01/09/2021    Procedure: Right Heart Catheterization;  Surgeon: Lesle Reek, MD;  Location: [facility] CATH;  Service: Cardiology       No current facility-administered medications for this encounter.    Current Outpatient Medications:     acetaminophen (TYLENOL) 500 MG tablet, Take 2 tablets (1,000 mg total) by mouth Three (3) times a day., Disp: , Rfl:     albuterol HFA 90 mcg/actuation inhaler, Inhale 2 puffs every eight (8) hours as needed for wheezing., Disp: , Rfl:     amiodarone (PACERONE) 200 MG tablet, TAKE ONE TABLET BY MOUTH ONCE DAILY, Disp: 90 tablet, Rfl: 3    colchicine (COLCRYS) 0.6 mg tablet, Take 0.3 mg (1/2 tablet) at the first sign of a gout flare. OK to repeat 0.3mg  (1/2 tablet) after 3 days if symptoms have not resolved, then every 3 days until flare resolves., Disp: 30 tablet, Rfl: 1    cranberry 500 mg cap, Take 500 mg by mouth daily with evening meal., Disp: , Rfl:  ergocalciferol-1,250 mcg, 50,000 unit, (DRISDOL) 1,250 mcg (50,000 unit) capsule, Take 1 capsule (1,250 mcg total) by mouth once a week., Disp: 12 capsule, Rfl: 3    estradioL (ESTRACE) 0.01 % (0.1 mg/gram) vaginal cream, Insert 2 g into the vagina Two (2) times a week., Disp: 42.5 g, Rfl: 3    ferrous sulfate 325 (65 FE) MG EC tablet, Take 1 tablet (325 mg total) by mouth Every Monday, Wednesday, and Friday., Disp: 36 tablet, Rfl: 3    fluticasone propion-salmeterol (ADVAIR HFA) 115-21 mcg/actuation inhaler, Inhale 2 puffs two (2) times a day., Disp: 12 g, Rfl: 11    gabapentin (NEURONTIN) 100 MG capsule, Take 1 capsule (100 mg total) by mouth two (2) times a day., Disp: 60 capsule, Rfl: 11    metOLazone (ZAROXOLYN) 5 MG tablet, Take 1 tablet (5 mg total) by mouth daily as needed (up to 3x weekly when instructed by cardiology clinic)., Disp: 12 tablet, Rfl: 1    mirtazapine (REMERON) 7.5 MG tablet, Take 7.5mg  (1 tablet) nightly for 1 week, then increase to 15mg  (2 tablets), Disp: 60 tablet, Rfl: 1    montelukast (SINGULAIR) 10 mg tablet, Take 1 tablet (10 mg total) by mouth every morning., Disp: 90 tablet, Rfl: 2    NARCAN 4 mg/actuation nasal spray, 1 spray into alternating nostrils once as needed (opioid overdose). PRN - Emergency use., Disp: , Rfl:     NON FORMULARY, APPLY FROM NECK DOWN TWICE A DAY (Patient not taking: Reported on 09/06/2023), Disp: , Rfl:     OXYCONTIN 10 mg TR12 12 hr crush resistant ER/CR tablet, Take 1 tablet (10 mg total) by mouth every twelve (12) hours., Disp: , Rfl:     OXYGEN-AIR DELIVERY SYSTEMS MISC, 5 L by Miscellaneous route. Currently using 3  L/min via [place], Disp: , Rfl:     pantoprazole (PROTONIX) 20 MG tablet, TAKE 1 TABLET(20 MG) BY MOUTH DAILY, Disp: 90 tablet, Rfl: 3    rosuvastatin (CRESTOR) 5 MG tablet, TAKE 1 TABLET EVERY OTHER DAY, Disp: 24 tablet, Rfl: 0    tafamidis (VYNDAMAX) 61 mg cap, Take 1 capsule (61 mg) by mouth daily., Disp: 30 capsule, Rfl: 11    tiotropium bromide (SPIRIVA RESPIMAT) 2.5 mcg/actuation inhalation mist, Inhale 2 puffs daily., Disp: 4 g, Rfl: 11    torsemide (DEMADEX) 20 MG tablet, Take 3 tablets (60 mg total) by mouth daily., Disp: , Rfl:     vitamin A-3,000 mcg RAE, 10,000 UNIT, 3,000 mcg RAE (10,000 UNIT) capsule, Take 1 capsule (3,000 mcg of RAE total) by mouth daily., Disp: , Rfl:     vutrisiran (AMVUTTRA) 25 mg/0.5 mL injection, Inject 0.5 mL (25 mg total) under the skin once. Every 12 Weeks, Disp: , Rfl:     Allergies  Nitrofurantoin and Lipitor [atorvastatin]    Family History  Family History   Problem Relation Age of Onset    Hypertension Mother     Cancer Father         COLON CANCER    No Known Problems Sister No Known Problems Sister     No Known Problems Brother     No Known Problems Brother     No Known Problems Maternal Aunt     No Known Problems Maternal Uncle     No Known Problems Paternal Aunt     No Known Problems Paternal Uncle     No Known Problems Maternal Grandmother     No Known Problems Maternal Grandfather  No Known Problems Paternal Grandmother     No Known Problems Paternal Grandfather     No Known Problems Other     Anesthesia problems Neg Hx     Broken bones Neg Hx     Clotting disorder Neg Hx     Collagen disease Neg Hx     Diabetes Neg Hx     Dislocations Neg Hx     Fibromyalgia Neg Hx     Gout Neg Hx     Hemophilia Neg Hx     Osteoporosis Neg Hx     Rheumatologic disease Neg Hx     Scoliosis Neg Hx     Severe sprains Neg Hx     Sickle cell anemia Neg Hx     Spinal Compression Fracture Neg Hx     GU problems Neg Hx     Kidney cancer Neg Hx     Prostate cancer Neg Hx        Social History  Social History     Tobacco Use    Smoking status: Former     Current packs/day: 0.00     Types: Cigarettes     Quit date: 2019     Years since quitting: 5.7    Smokeless tobacco: Never    Tobacco comments:     Quit a few years ago   Vaping Use    Vaping status: Never Used   Substance Use Topics    Alcohol use: No    Drug use: No        Physical Exam     VITAL SIGNS:      Vitals:    09/24/23 1304 09/24/23 1309 09/24/23 1323 09/24/23 1331   BP:  159/133     Pulse: 70 66 64 62   Resp:  24 13 14    Temp:  36.6 ??C (97.9 ??F)     TempSrc:  Oral     SpO2: (!) 80%  98% 94%     Constitutional: Alert and oriented. In acute respiratory distress.  Eyes: Conjunctivae are normal.  HEENT: Normocephalic and atraumatic. Conjunctivae clear. No congestion. Moist mucous membranes.   Cardiovascular: Rate as above, regular rhythm. Normal and symmetric distal pulses. Brisk capillary refill. Normal skin turgor.  Respiratory: Increased respiratory effort, coarse breath sounds with crackles in the bases bilaterally.  Gastrointestinal: Soft, non-distended, non-tender.  Genitourinary: Deferred.  Musculoskeletal: Non-tender with normal range of motion in all extremities.  Neurologic: Normal speech and language. No gross focal neurologic deficits are appreciated. Patient is moving all extremities equally, face is symmetric at rest and with speech.  Skin: Skin is warm, dry and intact. No rash noted.  Psychiatric: Mood and affect are normal. Speech and behavior are normal.     Radiology     XR Chest Portable   Final Result      Minimal change in portable imaging of chest relative to similar imaging from 1 day prior. Notably, there is similar enlargement of the cardiac silhouette and persistent, but improved, pulmonary edema. Small effusions may contribute to opacification of the costophrenic angles.           Pertinent labs & imaging results that were available during my care of the patient were independently interpreted by me and considered in my medical decision making (see chart for details).    Portions of this record have been created using Scientist, clinical (histocompatibility and immunogenetics). Dictation errors have been sought, but may not have been identified and corrected.  Cheryle Horsfall, MD  Resident  09/24/23 [phone]

## 2023-09-24 NOTE — Unmapped (Addendum)
[name] Clinic Note    Primary Cardiologist/Cardiology Provider(s):  Dr Wonda Cheng  PCP:  Artelia Laroche, MD    Last Cardiology Clinic Visit:  09/23/23  Last [name] Visit: 06/22/22    Reason for Visit:  Barbara Huber is a 82 y.o. female with a history of diastolic heart failure, who is being seen today in the [facility] [name] Clinic for an urgent visit for volume status optimization including [name].    Assessment/Plan:  Acute on Chronic diastolic heart failure/TTR Cardiac amyloid  - Echo 07/2023: EF > 70%  - Volume status today is increased. Dry weight previously 210-212, now likely substantially lower than this due to body weight loss in setting of poor appetite.  Chest xray yesterday showed moderate pulmonary edema.  - She took metolazone 5mg  this morning; IV Lasix 120mg  x1 was administered during clinic with poor response; thus we decided to proceed with hospital admission for further diuresis and management. Discussed with Dr Wonda Cheng and MDD team.   - Labs were reviewed from yesterday: Cr 4.46, BUN 100, K 4.7, Mg 2.4, pro-BNP 17,133  - Patient/daughter requested hospital bed for home use. I certify that the patient requires frequent or immediate changes to body position AND the patient requires the head of the bed to be elevated more than 30 degrees most of the time due to CHF. Order placed for [facility] home care.     2. AKI  - Cr increased in setting of volume retention: 4.46    Response to [name]:         Medications Heart rate Blood pressure Weight (lbs)   Pre  71 138/73 208   Hour 0 IV Lasix 120 mg      Hour 2 IV Lasix 0 mg      Post         Output:  I/O         09/25 0701  09/26 0700 09/26 0701  09/27 0700 09/27 0701  09/28 0700    Urine (mL/kg/hr)   125    Total Output(mL/kg)   125 (1.3)    Net   -125           Urine Occurrence   1 x            Return to clinic:    No follow-ups on file.    Future Appointments   Date Time Provider Department Center   10/06/2023  1:30 PM Oletta Darter [facility]   10/06/2023  2:45 PM UNCTIF 10 [facility]   11/10/2023  3:45 PM Artelia Laroche, MD [facility]   11/11/2023  3:30 PM Kshirsagar, Hamilton Capri, MD Andee Lineman       History of Present Illness:  Barbara Huber is a 82 y.o. female with a history of diastolic heart failure who presents today for [name].     Received mychart message from pt/daughter on 9/25 reporting hat she didn't pee much the night before and weight was 203lb that morning. Increased by 2 lbs overnight. Instructed to take metolazone 2.5mg  and extra 40 torsemide in the afternoon. Today her weight was up and so she requested a visit. She didn't urinate much and this morning was 205lbs. She doesn't feel very SOB at rest. She is not swollen. She doesn't feel bloated today. She has been taking colchicine every 3 days. She stopped the remeron because she felt like she wasn't urinating as much  since taking it.     Took torsemide 60mg  last night and metolazone 5mg  this morning. She did not pee hardly at all overnight. She doesn't feel SOB. She does report an uncomfortable feeling in her abdomen.         Past Medical History:   Diagnosis Date    Acute kidney injury superimposed on chronic kidney disease (CMS-HCC) 10/11/2020    Acute on chronic diastolic (congestive) heart failure (CMS-HCC) 08/23/2020    Acute on chronic diastolic congestive heart failure (CMS-HCC)     AKI (acute kidney injury) (CMS-HCC) 04/14/2015    Lab Results  Component  Value  Date     CREATININE  1.90 (H)  06/12/2021     Had a bump in her creatinine when she was taking her diuretics every day.  She is currently taking 40 mg daily of torsemide and 50 mg of spironolactone.  Her volume status is fragile.  Previously when she stopped her diuretic she becomes short of breath.  Plan: We will check her BMP today.  We will likely have to go to 40    Anemia 08/22/2019    Iron deficiency anemia          Lab Results Component    Value    Date           WBC    9.2    03/21/2021           RBC    3.67 (L)    03/21/2021           HGB    9.9 (L)    03/21/2021           HCT    30.7 (L)    03/21/2021           MCV    83.7    03/21/2021           MCH    26.9    03/21/2021           MCHC    32.1    03/21/2021           RDW    21.9 (H)    03/21/2021           PLT    318     Arthritis     Calculus of kidney     Calculus of ureter     CHF (congestive heart failure) (CMS-HCC)     Chronic atrial fibrillation (CMS-HCC) 07/20/2019    COPD (chronic obstructive pulmonary disease) (CMS-HCC)     Diabetes (CMS-HCC)     Gangrenous cholecystitis 10/11/2020    Generalized edema  06/17/2021    GERD (gastroesophageal reflux disease)     Hydronephrosis     Hypertension     Hyponatremia 10/11/2020    Intermediate coronary syndrome (CMS-HCC) 03/13/2014    Lower extremity edema 09/28/2020    Lumbar stenosis     Microscopic hematuria     Nausea alone     Nephrolithiasis 04/17/2016    Neuropathy     Nocturia     Nocturia 07/01/2017    Other chronic cystitis     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  See plans for anemia, AKI  We are also tapering her gabapentin.  Currently on 300 mg 3 times daily.  We will decrease it to twice daily, and then nightly.  She states she is not having any recurrence of her pain.  Pulmonary hypertension (CMS-HCC)     Renal colic     Shortness of breath 04/28/2020    Sleep apnea     Unstable angina pectoris (CMS-HCC) 03/13/2014     Past Surgical History:   Procedure Laterality Date    BACK SURGERY  1995    CARPAL TUNNEL RELEASE Left 2014    HYSTERECTOMY  1971    IR INSERT CHOLECYSTOSMY TUBE PERCUTANEOUS  10/02/2020    IR INSERT CHOLECYSTOSMY TUBE PERCUTANEOUS 10/02/2020 Braulio Conte, MD IMG VIR [facility]    LUMBAR DISC SURGERY      PR REMOVAL GALLBLADDER N/A 10/06/2020    Procedure: CHOLECYSTECTOMY;  Surgeon: Katherina Mires, MD;  Location: MAIN OR [facility];  Service: Trauma PR RIGHT HEART CATH O2 SATURATION & CARDIAC OUTPUT N/A 09/30/2020    Procedure: Right Heart Catheterization;  Surgeon: Neal Dy, MD;  Location: [facility] CATH;  Service: Cardiology    PR RIGHT HEART CATH O2 SATURATION & CARDIAC OUTPUT N/A 01/09/2021    Procedure: Right Heart Catheterization;  Surgeon: Lesle Reek, MD;  Location: [facility] CATH;  Service: Cardiology       Current Outpatient Medications   Medication Sig Dispense Refill    acetaminophen (TYLENOL) 500 MG tablet Take 2 tablets (1,000 mg total) by mouth Three (3) times a day.      albuterol HFA 90 mcg/actuation inhaler Inhale 2 puffs every eight (8) hours as needed for wheezing.      amiodarone (PACERONE) 200 MG tablet TAKE ONE TABLET BY MOUTH ONCE DAILY 90 tablet 3    colchicine (COLCRYS) 0.6 mg tablet Take 0.3 mg (1/2 tablet) at the first sign of a gout flare. OK to repeat 0.3mg  (1/2 tablet) after 3 days if symptoms have not resolved, then every 3 days until flare resolves. 30 tablet 1    cranberry 500 mg cap Take 500 mg by mouth daily with evening meal.      ergocalciferol-1,250 mcg, 50,000 unit, (DRISDOL) 1,250 mcg (50,000 unit) capsule Take 1 capsule (1,250 mcg total) by mouth once a week. 12 capsule 3    estradioL (ESTRACE) 0.01 % (0.1 mg/gram) vaginal cream Insert 2 g into the vagina Two (2) times a week. 42.5 g 3    ferrous sulfate 325 (65 FE) MG EC tablet Take 1 tablet (325 mg total) by mouth Every Monday, Wednesday, and Friday. 36 tablet 3    fluticasone propion-salmeterol (ADVAIR HFA) 115-21 mcg/actuation inhaler Inhale 2 puffs two (2) times a day. 12 g 11    gabapentin (NEURONTIN) 100 MG capsule Take 1 capsule (100 mg total) by mouth two (2) times a day. 60 capsule 11    metOLazone (ZAROXOLYN) 5 MG tablet Take 1 tablet (5 mg total) by mouth daily as needed (up to 3x weekly when instructed by cardiology clinic). 12 tablet 1    mirtazapine (REMERON) 7.5 MG tablet Take 7.5mg  (1 tablet) nightly for 1 week, then increase to 15mg  (2 tablets) 60 tablet 1    montelukast (SINGULAIR) 10 mg tablet Take 1 tablet (10 mg total) by mouth every morning. 90 tablet 2    NARCAN 4 mg/actuation nasal spray 1 spray into alternating nostrils once as needed (opioid overdose). PRN - Emergency use.      NON FORMULARY APPLY FROM NECK DOWN TWICE A DAY (Patient not taking: Reported on 09/06/2023)      OXYCONTIN 10 mg TR12 12 hr crush resistant ER/CR tablet Take 1 tablet (10 mg total) by mouth every twelve (12)  hours.      OXYGEN-AIR DELIVERY SYSTEMS MISC 5 L by Miscellaneous route. Currently using 3  L/min via [place]      pantoprazole (PROTONIX) 20 MG tablet TAKE 1 TABLET(20 MG) BY MOUTH DAILY 90 tablet 3    rosuvastatin (CRESTOR) 5 MG tablet TAKE 1 TABLET EVERY OTHER DAY 24 tablet 0    tafamidis (VYNDAMAX) 61 mg cap Take 1 capsule (61 mg) by mouth daily. 30 capsule 11    tiotropium bromide (SPIRIVA RESPIMAT) 2.5 mcg/actuation inhalation mist Inhale 2 puffs daily. 4 g 11    torsemide (DEMADEX) 20 MG tablet Take 3 tablets (60 mg total) by mouth daily.      vitamin A-3,000 mcg RAE, 10,000 UNIT, 3,000 mcg RAE (10,000 UNIT) capsule Take 1 capsule (3,000 mcg of RAE total) by mouth daily.      vutrisiran (AMVUTTRA) 25 mg/0.5 mL injection Inject 0.5 mL (25 mg total) under the skin once. Every 12 Weeks       No current facility-administered medications for this visit.     Allergies   Allergen Reactions    Nitrofurantoin Anaphylaxis and Other (See Comments)    Lipitor [Atorvastatin] Muscle Pain     SOB, Headache, fatigue, sick on my stomach       Objective:       Physical Exam  BP 138/73 (BP Site: L Arm, BP Position: Sitting, BP Cuff Size: Medium)  - Pulse 71  - Wt 94.3 kg (208 lb)  - SpO2 93%  - BMI 33.59 kg/m??    Wt Readings from Last 12 Encounters:   09/24/23 94.3 kg (208 lb)   09/23/23 93.9 kg (207 lb)   09/23/23 94 kg (207 lb 3.2 oz)   09/22/23 92.1 kg (203 lb)   09/06/23 93 kg (205 lb)   09/02/23 92.6 kg (204 lb 1.6 oz)   08/27/23 92.7 kg (204 lb 4.8 oz)   08/23/23 93.9 kg (207 lb 1.6 oz)   07/14/23 95.7 kg (211 lb)   07/14/23 95.5 kg (210 lb 8 oz)   06/17/23 95.3 kg (210 lb)   04/14/23 95.7 kg (211 lb)       General:  Patient is well-appearing in no acute distress   Eyes:  Intact, sclerae anicteric.   Ears, nose, mouth: Benign   Respiratory:   Decreased breath sounds at bases  There are no wheezes. Nasal cannula.    Cardiovascular:   JVP to ear sitting upright. Rate and rhythm are regular.  There is no lifts or heaves.  Normal S1, S2. There is no murmur, gallops or rubs.  Radial and pedal pulses are 2+, bilaterally.   There is 1+ LE edema L>R.   Gastrointestinal:   Soft, non-tender, with active audible bowel sounds. Abdomen nondistended.  Liver is nonpalpable.   Musculoskeletal: No joint swelling   Skin: Warm, well perfused.   Neurologic: Appropriate mood and affect. Alert and oriented to person, place, and time. No gross motor or sensory deficits evident.     Recent Labs:  Office Visit on 09/23/2023   Component Date Value Ref Range Status    Sodium 09/23/2023 131 (L)  135 - 145 mmol/L Final    Potassium 09/23/2023 4.7  3.4 - 4.8 mmol/L Final    Chloride 09/23/2023 98  98 - 107 mmol/L Final    CO2 09/23/2023 23.4  20.0 - 31.0 mmol/L Final    Anion Gap 09/23/2023 10  5 - 14 mmol/L Final    BUN 09/23/2023 100 (  H)  9 - 23 mg/dL Final    Creatinine 81/19/1478 4.46 (H)  0.55 - 1.02 mg/dL Final    BUN/Creatinine Ratio 09/23/2023 22   Final    eGFR [name]-EPI (2021) Female 09/23/2023 9 (L)  >=60 mL/min/1.54m2 Final    Glucose 09/23/2023 190 (H)  70 - 179 mg/dL Final    Calcium 29/56/2130 9.5  8.7 - 10.4 mg/dL Final    PRO-BNP 86/57/8469 17,133.0 (H)  <=300.0 pg/mL Final    Magnesium 09/23/2023 2.4  1.6 - 2.6 mg/dL Final    Free T4 62/95/2841 1.39  0.89 - 1.76 ng/dL Final    TSH 32/44/0102 2.346  0.550 - 4.780 uIU/mL Final       Lab Results   Component Value Date    PRO-BNP 17,133.0 (H) 09/23/2023    PRO-BNP 14,492.0 (H) 09/16/2023    PRO-BNP 4,575.0 (H) 09/06/2023    PRO-BNP 3,254.0 (H) 12/04/2022 PRO-BNP 3,420.0 (H) 07/03/2021    Creatinine 4.46 (H) 09/23/2023    Creatinine 3.11 (H) 09/16/2023    Creatinine 2.61 (H) 09/06/2023    Creatinine 3.20 (H) 09/02/2023    Creatinine 2.87 (H) 09/01/2023    Creatinine 0.79 01/06/2011    BUN 100 (H) 09/23/2023    BUN 89 (H) 09/16/2023    BUN 84 (H) 09/06/2023    BUN 89 (H) 09/02/2023    BUN 97 (H) 09/01/2023    BUN 20 01/06/2011    Sodium 131 (L) 09/23/2023    Sodium 136 09/16/2023    Sodium 136 09/06/2023    Sodium 139 09/02/2023    Sodium 138 09/01/2023    Sodium 140 01/06/2011    Potassium 4.7 09/23/2023    Potassium 4.7 09/16/2023    Potassium 4.3 09/06/2023    Potassium 4.4 09/02/2023    Potassium 4.5 09/01/2023    Potassium 4.1 01/06/2011    Magnesium 2.4 09/23/2023    Magnesium 2.4 09/16/2023    Magnesium 2.3 09/06/2023    Magnesium 2.1 09/02/2023    Magnesium 2.3 09/01/2023    Magnesium 2.0 01/06/2011       Metric Tracker:  Did today's visit result in [name]? No  Did today's visit result in hospital admission? No  Did today's visit result in referral to cardiology? n/a  Today's visit was a referral from Internal Medicine

## 2023-09-24 NOTE — Unmapped (Signed)
[facility] Advanced Heart Failure/Transplant/LVAD Cardiology (MDD) History & Physical    Date of Admission:  09/24/2023  Date of Service: 09/24/2023    Reason for Admission:  Barbara Huber is a 82 y.o. female with pertinent PMH of [name], cardiac amyloidosis, pulmonary hypertension, chronic respiratory failure on 3 L, COPD, [name] stage IV, atrial fibrillation, DM II that presents to the ED with acute on chronic heart failure exacerbation.     Principal Problem:    Acute exacerbation of CHF (congestive heart failure) (CMS-HCC)  Active Problems:    COPD (chronic obstructive pulmonary disease) (CMS-HCC)    Essential hypertension    Type 2 diabetes mellitus with stage 4 chronic kidney disease, with long-term current use of insulin (CMS-HCC)    Chronic respiratory failure with hypoxia (CMS-HCC)    Persistent atrial fibrillation (CMS-HCC)    Chronic kidney disease ([name]), stage IV (severe) (CMS-HCC)    Chronic prescription opiate use    Familial amyloid heart disease (CMS-HCC)       Assessment and Plan:     Acute on Chronic [name] Exacerbation - TTR Cardiac Amyloidosis: Patient follows with cardiology outpatient in the cardiac amyloid clinic and has been seen in pulm HTN clinic as well. The patient received steroids for episode of gout at the beginning of August and then had significant increase in fluid and was hospitalized for a heart failure exacerbation through August 26 at [facility]. Home GDMT includes Torsemide 60 mg daily and PRN metolazone. Jardiance stopped due to UTIs and she had issues with hypotension with ARB and BB. For her hATTR amyloid with neuropathy leading to falls, she was started Amvuttra 25mg  q85mos in April 2024. 2nd injection July 2024. She is on Tafamidis as well. Pt was seen in diuresis clinic today and given 120 mg IV lasix and pt had taken Metolazone prior to coming to clinic and pt had no urine output. Dry weight previously 210-212, now likely substantially lower than this due to body weight loss in setting of poor appetite. Weight on admission is 208 lbs. ProBnp 26,000 on admission. JVD to the mandible.   - Will give 120 mg IV lasix bolus and then start a lasix drip at 20 mg/hr  - Low threshold to start Dobutamine   - Will insert foley since starting lasix drip as pt is weak and has neuropathy  - Continue home tafamidis 61 mg daily    Chronic Respiratory Failure - COPD - Pulmonary HTN: Pt presented with O2 sats in the 80s. She wears 3 L of O2 at home. She uses albuterol, advair, and spiriva at home. She was placed on BiPap in the ED.   - Wean oxygen as able down to nasal cannula  - Breo and Incruse inhalers in place on home inhalers  - PRN albuterol    Chronic Kidney Disease stage V: Baseline seems to be around 2.5-3. Creatinine on admission was 5.34 with a BUN of 89 Follows with nephrology in the outpatient. She has asterixis on exam.   - Diuresis as above  - CTM, may need to consider nephology consult    Hx of Persistent Atrial Fibrillation: CHA2DS2-VASc score = 5 (1-gender, 2-Age, 1-DM, 1-HTN). Is on chronic amiodarone 200 mg daily for atrial fibrillation. She is no longer on apixaban due to to bleeding episodes and anemia in the past.  - Continue Amiodarone 200 mg daily      Chronic Problems  Chronic Pain: Followed by the outpatient pain clinic [facility] in [place].  Takes OxyContin 12 mg 2 times a day, can continue current regimen inpatient with bowel regimen ordered.  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     Prophylaxis: sub q heparin  Dispo: step down status  Code Status: full code    Ewell Poe, Juel Burrow  09/24/2023  3:56 PM      ---------------------------------------------------------------------------------------------------------------------    Chief Concern:    Chief Complaint   Patient presents with    Medical Problem       History of Present Illness:  Barbara Huber is a 82 y.o. female with PMH of [name], cardiac amyloidosis (TTR amyloid), pulmonary hypertension, chronic respiratory failure on 3 L, COPD, [name] stage IV, atrial fibrillation, DM II that presents to the ED with acute on chronic heart failure exacerbation.     Pt states recently she has been feeling more fatigued and weak. She is unable to walk because she feels weak. She denies any shortness of breath or chest pain. She states that she feels jittery. She has been following closely with Derwood Kaplan in the diuresis clinic. She took Metolazone prior to coming to the clinic today and then she got a dose of IV lasix. Pt and daughter state that she made very little urine after that. She has had two hospitalizations in the past month for heart failure. Her daughter states that she was given a course of steroids for gout in August and that seemed to tip things over. She has had a hard time returning to baseline after that. She was sent to the emergency department from the diuresis clinic for admission and further management.       Primary cardiologist:  Dr. Carin Hock  PCP:  Artelia Laroche, MD    Medical History:  Past Medical History:   Diagnosis Date    Acute kidney injury superimposed on chronic kidney disease (CMS-HCC) 10/11/2020    Acute on chronic diastolic (congestive) heart failure (CMS-HCC) 08/23/2020    Acute on chronic diastolic congestive heart failure (CMS-HCC)     AKI (acute kidney injury) (CMS-HCC) 04/14/2015    Lab Results  Component  Value  Date     CREATININE  1.90 (H)  06/12/2021     Had a bump in her creatinine when she was taking her diuretics every day.  She is currently taking 40 mg daily of torsemide and 50 mg of spironolactone.  Her volume status is fragile.  Previously when she stopped her diuretic she becomes short of breath.  Plan: We will check her BMP today.  We will likely have to go to 40    Anemia 08/22/2019    Iron deficiency anemia          Lab Results      Component    Value    Date           WBC    9.2    03/21/2021           RBC    3.67 (L)    03/21/2021           HGB    9.9 (L)    03/21/2021           HCT  30.7 (L)    03/21/2021           MCV    83.7    03/21/2021           MCH    26.9    03/21/2021           MCHC    32.1    03/21/2021           RDW    21.9 (H)    03/21/2021           PLT    318     Arthritis     Calculus of kidney     Calculus of ureter     CHF (congestive heart failure) (CMS-HCC)     Chronic atrial fibrillation (CMS-HCC) 07/20/2019    COPD (chronic obstructive pulmonary disease) (CMS-HCC)     Diabetes (CMS-HCC)     Gangrenous cholecystitis 10/11/2020    Generalized edema  06/17/2021    GERD (gastroesophageal reflux disease)     Hydronephrosis     Hypertension     Hyponatremia 10/11/2020    Intermediate coronary syndrome (CMS-HCC) 03/13/2014    Lower extremity edema 09/28/2020    Lumbar stenosis     Microscopic hematuria     Nausea alone     Nephrolithiasis 04/17/2016    Neuropathy     Nocturia     Nocturia 07/01/2017    Other chronic cystitis     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  See plans for anemia, AKI  We are also tapering her gabapentin.  Currently on 300 mg 3 times daily.  We will decrease it to twice daily, and then nightly.  She states she is not having any recurrence of her pain.    Pulmonary hypertension (CMS-HCC)     Renal colic     Shortness of breath 04/28/2020    Sleep apnea     Unstable angina pectoris (CMS-HCC) 03/13/2014       Past Surgical History:   Procedure Laterality Date    BACK SURGERY  1995    CARPAL TUNNEL RELEASE Left 2014    HYSTERECTOMY  1971    IR INSERT CHOLECYSTOSMY TUBE PERCUTANEOUS  10/02/2020    IR INSERT CHOLECYSTOSMY TUBE PERCUTANEOUS 10/02/2020 Braulio Conte, MD IMG VIR H&V [facility]    LUMBAR DISC SURGERY      PR REMOVAL GALLBLADDER N/A 10/06/2020    Procedure: CHOLECYSTECTOMY;  Surgeon: Katherina Mires, MD;  Location: MAIN OR [facility];  Service: Trauma    PR RIGHT HEART CATH O2 SATURATION & CARDIAC OUTPUT N/A 09/30/2020    Procedure: Right Heart Catheterization;  Surgeon: Neal Dy, MD;  Location: [facility] CATH;  Service: Cardiology    PR RIGHT HEART CATH O2 SATURATION & CARDIAC OUTPUT N/A 01/09/2021    Procedure: Right Heart Catheterization;  Surgeon: Lesle Reek, MD;  Location: [facility] CATH;  Service: Cardiology       Prior to Admission medications    Medication Dose, Route, Frequency   acetaminophen (TYLENOL) 500 MG tablet 1,000 mg, Oral, 3 times a day (standard)   albuterol HFA 90 mcg/actuation inhaler 2 puffs, Inhalation, Every 8 hours PRN   amiodarone (PACERONE) 200 MG tablet 200 mg, Oral, Daily (standard)   colchicine (COLCRYS) 0.6 mg tablet Take 0.3 mg (1/2 tablet) at the first sign of a gout flare. OK to repeat 0.3mg  (1/2 tablet) after 3 days if symptoms have not resolved, then every 3  days until flare resolves.   cranberry 500 mg cap 500 mg, Oral, Daily   ergocalciferol-1,250 mcg, 50,000 unit, (DRISDOL) 1,250 mcg (50,000 unit) capsule 1,250 mcg, Oral, Weekly   estradioL (ESTRACE) 0.01 % (0.1 mg/gram) vaginal cream 2 g, Vaginal, 2 times a week   ferrous sulfate 325 (65 FE) MG EC tablet 325 mg, Oral, Every Monday, Wednesday, Friday   fluticasone propion-salmeterol (ADVAIR HFA) 115-21 mcg/actuation inhaler 2 puffs, Inhalation, 2 times a day (standard)   gabapentin (NEURONTIN) 100 MG capsule 100 mg, Oral, 2 times a day (standard)   metOLazone (ZAROXOLYN) 5 MG tablet 5 mg, Oral, Daily PRN   mirtazapine (REMERON) 7.5 MG tablet Take 7.5mg  (1 tablet) nightly for 1 week, then increase to 15mg  (2 tablets)   montelukast (SINGULAIR) 10 mg tablet 10 mg, Oral, Every morning   NARCAN 4 mg/actuation nasal spray 1 spray, Alternating Nares, Once as needed, PRN - Emergency use.   NON FORMULARY APPLY FROM NECK DOWN TWICE A DAY  Patient not taking: Reported on 09/06/2023   OXYCONTIN 10 mg TR12 12 hr crush resistant ER/CR tablet 10 mg, Oral, Every 12 hours   OXYGEN-AIR DELIVERY SYSTEMS MISC 5 L, Miscellaneous, Currently using 3  L/min via [place]   pantoprazole (PROTONIX) 20 MG tablet 20 mg, Oral, Daily (standard), TAKE 1 TABLET(20 MG) BY MOUTH DAILY   rosuvastatin (CRESTOR) 5 MG tablet TAKE 1 TABLET EVERY OTHER DAY   tafamidis (VYNDAMAX) 61 mg cap Take 1 capsule (61 mg) by mouth daily.   tiotropium bromide (SPIRIVA RESPIMAT) 2.5 mcg/actuation inhalation mist 2 puffs, Inhalation, Daily (standard)   torsemide (DEMADEX) 20 MG tablet 60 mg, Oral, Daily (standard)   vitamin A-3,000 mcg RAE, 10,000 UNIT, 3,000 mcg RAE (10,000 UNIT) capsule 3,000 mcg of RAE, Oral, Daily (standard)   vutrisiran (AMVUTTRA) 25 mg/0.5 mL injection 25 mg, Subcutaneous, Once, Every 12 Weeks       ALLERGIES:  Nitrofurantoin and Lipitor [atorvastatin]    Family History:  family history includes Cancer in her father; Hypertension in her mother; No Known Problems in her brother, brother, maternal aunt, maternal grandfather, maternal grandmother, maternal uncle, paternal aunt, paternal grandfather, paternal grandmother, paternal uncle, sister, sister, and another family member.    Social History:   Lives with daughter  She  reports that she quit smoking about 5 years ago. Her smoking use included cigarettes. She has never used smokeless tobacco. She reports that she does not drink alcohol and does not use drugs.    Review of Systems:  Rest of the review of systems is negative or unremarkable except as stated above.    Designated Healthcare Decision Maker:  Ms. Folkes currently has decisional capacity for healthcare decision-making and is able to designate a surrogate healthcare decision maker. Ms. Loretto designated healthcare decision maker(s) is/are Economist (the patient's adult child) as denoted by stated patient preference.      Physical Exam  BP 159/133  - Pulse 56  - Temp 36.6 ??C (97.9 ??F) (Oral)  - Resp 9  - SpO2 97%   Wt Readings from Last 3 Encounters:   09/24/23 94.3 kg (208 lb)   09/23/23 93.9 kg (207 lb)   09/23/23 94 kg (207 lb 3.2 oz)         There is no height or weight on file to calculate BMI.    Gen: NAD, converses   Neck: Trachea midline.  JVP  to the mandible at 45 degrees .   Heart :  Normal S1,  S2. No murmur, no gallop or rub.  Radial and pedal pulses are 2+, bilaterally.     Lungs: Decreased breath sounds at bases.  Abdomen: Soft, NTND. Liver is nonpalpable.  Extremities: no pedal edema, bilaterally.   Skin:  No rashes, lesions noted, normal turgor  Neuro/Psych: Alert, oriented. Nonfocal       Labs & Imaging:  Reviewed in Epic  Lab Results   Component Value Date    WBC 6.9 09/24/2023    HGB 13.0 09/24/2023    HCT 40.2 09/24/2023    PLT 144 (L) 09/24/2023       Lab Results   Component Value Date    NA 128 (L) 09/24/2023    K 4.8 09/24/2023    CL 98 09/24/2023    CO2 20.0 09/24/2023    BUN 89 (H) 09/24/2023    CREATININE 5.34 (H) 09/24/2023    GLU 174 09/24/2023    CALCIUM 9.6 09/24/2023    MG 2.4 09/24/2023    PHOS 4.5 08/31/2023       Lab Results   Component Value Date    BILITOT 0.5 09/24/2023    BILIDIR 0.30 08/21/2023    PROT 7.3 09/24/2023    ALBUMIN 3.8 09/24/2023    ALT 12 09/24/2023    AST 24 09/24/2023    ALKPHOS 154 (H) 09/24/2023    GGT 215 (H) 09/29/2020       Lab Results   Component Value Date    PT 11.6 09/24/2023    INR 1.04 09/24/2023    APTT 46.9 (H) 01/10/2021           EKG: 09/24/23  WIDE QRS RHYTHM  INTRAVENTRICULAR CONDUCTION DELAY  INFERIOR INFARCT , AGE UNDETERMINED  POSSIBLE ANTEROLATERAL INFARCT  , AGE UNDETERMINED    Chest x-ray: 09/24/23  Minimal change in portable imaging of chest relative to similar imaging from 1 day prior. Notably, there is similar enlargement of the cardiac silhouette and persistent, but improved, pulmonary edema. Small effusions may contribute to opacification of the costophrenic angles.        Most recent pertinent Cardiac Studies:  Echocardiogram: 08/19/23  1. The left ventricle is normal in size with moderately increased wall  thickness.    2. 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    3. The left ventricular systolic function is hyperdynamic, LVEF is visually  estimated at >70%.    4. Mitral annular calcification is present (moderate).    5. The mitral valve leaflets are mildly thickened with normal leaflet  mobility.    6. The aortic valve is trileaflet with mildly thickened leaflets with normal  excursion.    7. The left atrium is moderately dilated in size.    8. The right ventricle is normal in size, with normal systolic function.

## 2023-09-25 LAB — BASIC METABOLIC PANEL
ANION GAP: 10 mmol/L (ref 5–14)
ANION GAP: 4 mmol/L — ABNORMAL LOW (ref 5–14)
BLOOD UREA NITROGEN: 110 mg/dL — ABNORMAL HIGH (ref 9–23)
BLOOD UREA NITROGEN: 101 mg/dL — ABNORMAL HIGH (ref 9–23)
BUN / CREAT RATIO: 20
BUN / CREAT RATIO: 21
CALCIUM: 8.8 mg/dL (ref 8.7–10.4)
CALCIUM: 8.8 mg/dL (ref 8.7–10.4)
CHLORIDE: 98 mmol/L (ref 98–107)
CHLORIDE: 100 mmol/L (ref 98–107)
CO2: 24 mmol/L (ref 20.0–31.0)
CO2: 26 mmol/L (ref 20.0–31.0)
CREATININE: 5.41 mg/dL — ABNORMAL HIGH
CREATININE: 4.75 mg/dL — ABNORMAL HIGH
EGFR CKD-EPI (2021) FEMALE: 7 mL/min/{1.73_m2} — ABNORMAL LOW (ref >=60)
EGFR CKD-EPI (2021) FEMALE: 9 mL/min/{1.73_m2} — ABNORMAL LOW (ref >=60)
GLUCOSE RANDOM: 103 mg/dL (ref 70–179)
GLUCOSE RANDOM: 147 mg/dL (ref 70–179)
POTASSIUM: 4.3 mmol/L (ref 3.4–4.8)
POTASSIUM: 3.8 mmol/L (ref 3.4–4.8)
SODIUM: 132 mmol/L — ABNORMAL LOW (ref 135–145)
SODIUM: 130 mmol/L — ABNORMAL LOW (ref 135–145)

## 2023-09-25 LAB — CBC
HEMATOCRIT: 36.8 % (ref 34.0–44.0)
HEMOGLOBIN: 11.8 g/dL (ref 11.3–14.9)
MEAN CORPUSCULAR HEMOGLOBIN CONC: 32.1 g/dL (ref 32.0–36.0)
MEAN CORPUSCULAR HEMOGLOBIN: 29.9 pg (ref 25.9–32.4)
MEAN CORPUSCULAR VOLUME: 93.2 fL (ref 77.6–95.7)
MEAN PLATELET VOLUME: 9.1 fL (ref 6.8–10.7)
PLATELET COUNT: 133 10*9/L — ABNORMAL LOW (ref 150–450)
RED BLOOD CELL COUNT: 3.95 10*12/L (ref 3.95–5.13)
RED CELL DISTRIBUTION WIDTH: 15 % (ref 12.2–15.2)
WBC ADJUSTED: 6.7 10*9/L (ref 3.6–11.2)

## 2023-09-25 LAB — MAGNESIUM
MAGNESIUM: 2.5 mg/dL (ref 1.6–2.6)
MAGNESIUM: 2.4 mg/dL (ref 1.6–2.6)

## 2023-09-25 LAB — O2 SATURATION VENOUS
O2 SATURATION VENOUS: 64.3 % (ref 40.0–85.0)
O2 SATURATION VENOUS: 68.6 % (ref 40.0–85.0)

## 2023-09-25 LAB — LACTATE, VENOUS, WHOLE BLOOD: LACTATE BLOOD VENOUS: 1.7 mmol/L (ref 0.5–1.8)

## 2023-09-25 MED ADMIN — gabapentin (NEURONTIN) capsule 100 mg: 100 mg | ORAL | @ 01:00:00

## 2023-09-25 MED ADMIN — heparin (porcine) 5,000 unit/mL injection 5,000 Units: 5000 [IU] | SUBCUTANEOUS | @ 01:00:00

## 2023-09-25 MED ADMIN — furosemide 200 mg in sodium chloride 0.9 % 100 mL (2 mg/mL) infusion: 20 mg/h | INTRAVENOUS | @ 21:00:00

## 2023-09-25 MED ADMIN — oxyCODONE (OxyCONTIN) 12 hr crush resistant ER/CR tablet 10 mg: 10 mg | ORAL | @ 13:00:00 | Stop: 2023-10-08

## 2023-09-25 MED ADMIN — montelukast (SINGULAIR) tablet 10 mg: 10 mg | ORAL | @ 13:00:00

## 2023-09-25 MED ADMIN — tafamidis cap 61 mg **PATIENT-SUPPLIED**: 61 mg | ORAL | @ 13:00:00

## 2023-09-25 MED ADMIN — umeclidinium (INCRUSE ELLIPTA) 62.5 mcg/actuation inhaler 1 puff: 1 | RESPIRATORY_TRACT | @ 14:00:00

## 2023-09-25 MED ADMIN — heparin (porcine) 5,000 unit/mL injection 5,000 Units: 5000 [IU] | SUBCUTANEOUS | @ 19:00:00

## 2023-09-25 MED ADMIN — midazolam (VERSED) injection 1 mg: 1 mg | INTRAVENOUS | @ 21:00:00 | Stop: 2023-09-25

## 2023-09-25 MED ADMIN — DOBUTamine 1,000 mg in dextrose 5% 250 ml (4,000 mcg/ml) infusion PMB: 2 ug/kg/min | INTRAVENOUS | @ 11:00:00 | Stop: 2023-09-25

## 2023-09-25 MED ADMIN — heparin (porcine) 5,000 unit/mL injection 5,000 Units: 5000 [IU] | SUBCUTANEOUS | @ 09:00:00

## 2023-09-25 MED ADMIN — furosemide 200 mg in sodium chloride 0.9 % 100 mL (2 mg/mL) infusion: 20 mg/h | INTRAVENOUS | @ 09:00:00

## 2023-09-25 MED ADMIN — senna (SENOKOT) tablet 1 tablet: 1 | ORAL | @ 01:00:00

## 2023-09-25 MED ADMIN — midazolam (VERSED) 1 mg/mL injection: INTRAVENOUS | @ 21:00:00 | Stop: 2023-09-25

## 2023-09-25 MED ADMIN — oxyCODONE (OxyCONTIN) 12 hr crush resistant ER/CR tablet 10 mg: 10 mg | ORAL | @ 01:00:00 | Stop: 2023-10-08

## 2023-09-25 MED ADMIN — DOPamine 400 mg in dextrose 5% 250 mL (1600 mcg/mL) infusion PMB: 2 ug/kg/min | INTRAVENOUS | @ 23:00:00

## 2023-09-25 MED ADMIN — pantoprazole (Protonix) EC tablet 20 mg: 20 mg | ORAL | @ 13:00:00

## 2023-09-25 MED ADMIN — gabapentin (NEURONTIN) capsule 100 mg: 100 mg | ORAL | @ 13:00:00

## 2023-09-25 MED ADMIN — amiodarone (PACERONE) tablet 200 mg: 200 mg | ORAL | @ 13:00:00

## 2023-09-25 MED ADMIN — vitamin A-3,000 mcg RAE (10,000 UNIT) capsule 3,000 mcg of RAE: 3000 ug | ORAL | @ 13:00:00

## 2023-09-25 MED ADMIN — fluticasone furoate-vilanterol (BREO ELLIPTA) 100-25 mcg/dose inhaler 1 puff: 1 | RESPIRATORY_TRACT | @ 14:00:00

## 2023-09-25 MED ADMIN — polyethylene glycol (MIRALAX) packet 17 g: 17 g | ORAL | @ 15:00:00

## 2023-09-25 NOTE — Unmapped (Signed)
Nephrology Consult Note    Requesting Attending Physician :  Cyril Loosen, MD  Service Requesting Consult : Cardiology [facility])  Reason for Consult: concern for renal involvement of amyloid    Assessment and Plan:    # AKI on [name] 4   - Baseline EGFR ~15-20 (cr ~2.6), follows with [facility]. [name] attributed to hypertension and diabetes. Recently with worsening renal functions attributed to cardiorenal syndrome.Has had similar admissions in 01/2023 with volume overload. Urine sediment remarkable for yeast, 50% dysmorphic RBCs per HPF and waxy casts  Plan:   - Please obtain UA, Urine Na, UPCR and UACR   - Do not suspect renal involvement of ATTR as she has had no proteinuria. With amyloidosis, would have expected to see tubular proteinuria and pt has not had an elevated UPCR since 2022. Last MM work up back in 2021 was negative for IFE and SFLC were within normal limits. Would recommend repeating MM work up.   - Agree with diuresis. Defer titration to cards based on swan numbers.   - Briefly talked to daughter and pt about possibility of dialysis with kidney function does not improve and that if she were to go on dialysis it will more than likely be permanent. Pt states she is going to have to think about it. No indications for HD at this point. Has some asterexis and weakness and fatigue which can be indicative of renal failure. We will eval daily for HD needs    # ATTR   #Acute on chronic heart failure   - Evaluation and management per primary team  - No changes to management from a nephrology standpoint at this time    RECOMMENDATIONS:   - obtain UA, Urine Na, UPCR and UACR, multiple myeloma work up  - We will continue to follow.     Donato Heinz, MD  09/25/2023 2:27 PM     Medical decision-making for 09/25/23  Findings / Data     Patient has: []  acute illness w/systemic sxs  [mod]  []  two or more stable chronic illnesses [mod]  []  one chronic illness with acute exacerbation [mod]  []  acute complicated illness  [mod]  []  Undiagnosed new problem with uncertain prognosis  [mod] [x]  illness posing risk to life or bodily function (ex. AKI)  [high]  []  chronic illness with severe exacerbation/progression  [high]  []  chronic illness with severe side effects of treatment  [high] AKI [name] 5 Probs At least 2:  Probs, Data, Risk   I reviewed: [x]  primary team note  [x]  consultant note(s)  [x]  external records [x]  chemistry results  [x]  CBC results  []  blood gas results  []  Other []  procedure/op note(s)   []  radiology report(s)  []  micro result(s)  []  w/ independent historian(s) Reviewed >=3 Data Review (2 of 3)    I independently interpreted: []  Urine Sediment  []  Renal US [x]  CXR Images  []  CT Images  []  Other []  EKG Tracing Reviewed  Any     I discussed: []  Pathology results w/ QHPs(s) from other specialties  []  Procedural findings w/ QHPs(s) from other specialties []  Imaging w/ QHP(s) from other specialties  [x]  Treatment plan w/ QHP(s) from other specialties Plan discussed with primary team Any     Mgm't requires: []  Prescription drug(s)  [mod]  []  Kidney biopsy  [mod]  []  Central line placement  [mod] []  High risk medication use and/or intensive toxicity monitoring [high]  []  Renal replacement therapy [high]  []  High  risk kidney biopsy  [high]  []  Escalation of care  [high]  []  High risk central line placement  [high] [name]: check BMP/Mg  Risk      ____________________________________________________    History of Present Illness:   Barbara Huber is a 82 y.o. female with pertinent PMH of [name], cardiac amyloidosis, pulmonary hypertension, chronic respiratory failure on 3 L, COPD, [name] stage IV, atrial fibrillation, DM II. Nephrology has been consulted by Dr. Scarlette Calico for eval of AKI    Pt presented to the hospital on 9/27 as she has been feeling weak. She has also been feeling tremulous. She follows with Dr. Einar Gip and diuresis clinic. She was was seen there yesterday and they gave her a dose of IV lasix and metolazone but pt did not mack much urine after. Pt and daughter reports that she has not been making much urine to torsemide 60 mg daily. She also complains of nausea,fatigue and tremors. No shortness of breath or chest pain    INPATIENT MEDICATIONS:    Current Facility-Administered Medications:     acetaminophen (TYLENOL) tablet 1,000 mg, Oral, Q8H PRN    albuterol (PROVENTIL HFA;VENTOLIN HFA) 90 mcg/actuation inhaler 2 puff, Inhalation, Q8H PRN    amiodarone (PACERONE) tablet 200 mg, Oral, Daily    DOBUTamine 1,000 mg in dextrose 5% 250 ml (4,000 mcg/ml) infusion PMB, Intravenous, Continuous    fluticasone furoate-vilanterol (BREO ELLIPTA) 100-25 mcg/dose inhaler 1 puff, Inhalation, Daily (RT)    furosemide 200 mg in sodium chloride 0.9 % 100 mL (2 mg/mL) infusion, Intravenous, Continuous    gabapentin (NEURONTIN) capsule 100 mg, Oral, BID    heparin (porcine) 5,000 unit/mL injection 5,000 Units, Subcutaneous, Q8H SCH    montelukast (SINGULAIR) tablet 10 mg, Oral, Daily    oxyCODONE (OxyCONTIN) 12 hr crush resistant ER/CR tablet 10 mg, Oral, Q12H SCH    pantoprazole (Protonix) EC tablet 20 mg, Oral, Daily    polyethylene glycol (MIRALAX) packet 17 g, Oral, Daily PRN    senna (SENOKOT) tablet 1 tablet, Oral, Nightly    tafamidis cap 61 mg **PATIENT-SUPPLIED**, Oral, Daily    umeclidinium (INCRUSE ELLIPTA) 62.5 mcg/actuation inhaler 1 puff, Inhalation, Daily (RT)    vitamin A-3,000 mcg RAE (10,000 UNIT) capsule 3,000 mcg of RAE, Oral, Daily    OUTPATIENT MEDICATIONS:  Prior to Admission medications    Medication Dose, Route, Frequency   acetaminophen (TYLENOL) 500 MG tablet 1,000 mg, Oral, 3 times a day (standard)   albuterol HFA 90 mcg/actuation inhaler 2 puffs, Inhalation, Every 8 hours PRN   amiodarone (PACERONE) 200 MG tablet 200 mg, Oral, Daily (standard)   colchicine (COLCRYS) 0.6 mg tablet Take 0.3 mg (1/2 tablet) at the first sign of a gout flare. OK to repeat 0.3mg  (1/2 tablet) after 3 days if symptoms have not resolved, then every 3 days until flare resolves.   cranberry 500 mg cap 500 mg, Oral, Daily   ergocalciferol-1,250 mcg, 50,000 unit, (DRISDOL) 1,250 mcg (50,000 unit) capsule 1,250 mcg, Oral, Weekly   estradioL (ESTRACE) 0.01 % (0.1 mg/gram) vaginal cream 2 g, Vaginal, 2 times a week   ferrous sulfate 325 (65 FE) MG EC tablet 325 mg, Oral, Every Monday, Wednesday, Friday   fluticasone propion-salmeterol (ADVAIR HFA) 115-21 mcg/actuation inhaler 2 puffs, Inhalation, 2 times a day (standard)   gabapentin (NEURONTIN) 100 MG capsule 100 mg, Oral, 2 times a day (standard)   metOLazone (ZAROXOLYN) 5 MG tablet 5 mg, Oral, Daily PRN   mirtazapine (REMERON) 7.5 MG tablet Take  7.5mg  (1 tablet) nightly for 1 week, then increase to 15mg  (2 tablets)   montelukast (SINGULAIR) 10 mg tablet 10 mg, Oral, Every morning   NARCAN 4 mg/actuation nasal spray 1 spray, Alternating Nares, Once as needed, PRN - Emergency use.   NON FORMULARY    OXYCONTIN 10 mg TR12 12 hr crush resistant ER/CR tablet 10 mg, Oral, Every 12 hours   OXYGEN-AIR DELIVERY SYSTEMS MISC 5 L, Miscellaneous, Currently using 3  L/min via [place]   pantoprazole (PROTONIX) 20 MG tablet 20 mg, Oral, Daily (standard), TAKE 1 TABLET(20 MG) BY MOUTH DAILY   rosuvastatin (CRESTOR) 5 MG tablet TAKE 1 TABLET EVERY OTHER DAY   tafamidis (VYNDAMAX) 61 mg cap Take 1 capsule (61 mg) by mouth daily.   tiotropium bromide (SPIRIVA RESPIMAT) 2.5 mcg/actuation inhalation mist 2 puffs, Inhalation, Daily (standard)   torsemide (DEMADEX) 20 MG tablet 60 mg, Oral, Daily (standard)   vitamin A-3,000 mcg RAE, 10,000 UNIT, 3,000 mcg RAE (10,000 UNIT) capsule 3,000 mcg of RAE, Oral, Daily (standard)   vutrisiran (AMVUTTRA) 25 mg/0.5 mL injection 25 mg, Subcutaneous, Once, Every 12 Weeks        ALLERGIES:  Nitrofurantoin and Lipitor [atorvastatin]    MEDICAL HISTORY:  Past Medical History:   Diagnosis Date    Acute kidney injury superimposed on chronic kidney disease (CMS-HCC) 10/11/2020 Acute on chronic diastolic (congestive) heart failure (CMS-HCC) 08/23/2020    Acute on chronic diastolic congestive heart failure (CMS-HCC)     AKI (acute kidney injury) (CMS-HCC) 04/14/2015    Lab Results  Component  Value  Date     CREATININE  1.90 (H)  06/12/2021     Had a bump in her creatinine when she was taking her diuretics every day.  She is currently taking 40 mg daily of torsemide and 50 mg of spironolactone.  Her volume status is fragile.  Previously when she stopped her diuretic she becomes short of breath.  Plan: We will check her BMP today.  We will likely have to go to 40    Anemia 08/22/2019    Iron deficiency anemia          Lab Results      Component    Value    Date           WBC    9.2    03/21/2021           RBC    3.67 (L)    03/21/2021           HGB    9.9 (L)    03/21/2021           HCT    30.7 (L)    03/21/2021           MCV    83.7    03/21/2021           MCH    26.9    03/21/2021           MCHC    32.1    03/21/2021           RDW    21.9 (H)    03/21/2021           PLT    318     Arthritis     Calculus of kidney     Calculus of ureter     CHF (congestive heart failure) (CMS-HCC)     Chronic atrial fibrillation (CMS-HCC) 07/20/2019    COPD (chronic obstructive  pulmonary disease) (CMS-HCC)     Diabetes (CMS-HCC)     Gangrenous cholecystitis 10/11/2020    Generalized edema  06/17/2021    GERD (gastroesophageal reflux disease)     Hydronephrosis     Hypertension     Hyponatremia 10/11/2020    Intermediate coronary syndrome (CMS-HCC) 03/13/2014    Lower extremity edema 09/28/2020    Lumbar stenosis     Microscopic hematuria     Nausea alone     Nephrolithiasis 04/17/2016    Neuropathy     Nocturia     Nocturia 07/01/2017    Other chronic cystitis     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  See plans for anemia, AKI  We are also tapering her gabapentin.  Currently on 300 mg 3 times daily.  We will decrease it to twice daily, and then nightly.  She states she is not having any recurrence of her pain.    Pulmonary hypertension (CMS-HCC)     Renal colic     Shortness of breath 04/28/2020    Sleep apnea     Unstable angina pectoris (CMS-HCC) 03/13/2014     Past Surgical History:   Procedure Laterality Date    BACK SURGERY  1995    CARPAL TUNNEL RELEASE Left 2014    HYSTERECTOMY  1971    IR INSERT CHOLECYSTOSMY TUBE PERCUTANEOUS  10/02/2020    IR INSERT CHOLECYSTOSMY TUBE PERCUTANEOUS 10/02/2020 Braulio Conte, MD IMG VIR H&[facility]    LUMBAR DISC SURGERY      PR REMOVAL GALLBLADDER N/A 10/06/2020    Procedure: CHOLECYSTECTOMY;  Surgeon: Katherina Mires, MD;  Location: MAIN OR [facility];  Service: Trauma    PR RIGHT HEART CATH O2 SATURATION & CARDIAC OUTPUT N/A 09/30/2020    Procedure: Right Heart Catheterization;  Surgeon: Neal Dy, MD;  Location: [facility] CATH;  Service: Cardiology    PR RIGHT HEART CATH O2 SATURATION & CARDIAC OUTPUT N/A 01/09/2021    Procedure: Right Heart Catheterization;  Surgeon: Lesle Reek, MD;  Location: [facility] CATH;  Service: Cardiology     SOCIAL HISTORY  Social History     Social History Narrative    Previously worked in Designer, fashion/clothing, retired.  Daughter is Economist who works at [employer].  Her niece is Selena Batten, [employer] Anesthesia Tech.  Lives in [place] with daughter and son-in-law.      reports that she quit smoking about 5 years ago. Her smoking use included cigarettes. She has never used smokeless tobacco. She reports that she does not drink alcohol and does not use drugs.   FAMILY HISTORY  Family History   Problem Relation Age of Onset    Hypertension Mother     Cancer Father         COLON CANCER    No Known Problems Sister     No Known Problems Sister     No Known Problems Brother     No Known Problems Brother     No Known Problems Maternal Aunt     No Known Problems Maternal Uncle     No Known Problems Paternal Aunt     No Known Problems Paternal Uncle     No Known Problems Maternal Grandmother     No Known Problems Maternal Grandfather     No Known Problems Paternal Grandmother     No Known Problems Paternal Grandfather     No Known Problems Other  Anesthesia problems Neg Hx     Broken bones Neg Hx     Clotting disorder Neg Hx     Collagen disease Neg Hx     Diabetes Neg Hx     Dislocations Neg Hx     Fibromyalgia Neg Hx     Gout Neg Hx     Hemophilia Neg Hx     Osteoporosis Neg Hx     Rheumatologic disease Neg Hx     Scoliosis Neg Hx     Severe sprains Neg Hx     Sickle cell anemia Neg Hx     Spinal Compression Fracture Neg Hx     GU problems Neg Hx     Kidney cancer Neg Hx     Prostate cancer Neg Hx         Physical Exam:   Vitals:    09/25/23 0000 09/25/23 0430 09/25/23 0900 09/25/23 1222   BP: 125/70 100/59 115/49 105/49   Pulse: 54 55 65 67   Resp: 15 15 15 16    Temp: 36.1 ??C (97 ??F) 36.3 ??C (97.4 ??F) 37.5 ??C (99.5 ??F) 36.6 ??C (97.9 ??F)   TempSrc: Temporal Temporal Oral Oral   SpO2: 99% 99% 98% 94%     I/O this shift:  In: 260 [P.O.:260]  Out: 500 [Urine:500]    Intake/Output Summary (Last 24 hours) at 09/25/2023 1427  Last data filed at 09/25/2023 1300  Gross per 24 hour   Intake 440 ml   Output 1125 ml   Net -685 ml     Constitutional: well-appearing, no acute distress  Heart: RRR  Lungs: CTAB  Abd: non-distended  Ext: 1+ edema

## 2023-09-25 NOTE — Unmapped (Signed)
Admitted from ED with CHF exacerbation. Currently on 6L per [place]. Plan to start lasix gtt. Plan for foley insertion. Family at bedside. Denies pain. Alert, calm, and in bed at this time.       Problem: Adult Inpatient Plan of Care  Goal: Plan of Care Review  Outcome: Ongoing - Unchanged  Goal: Patient-Specific Goal (Individualized)  Outcome: Ongoing - Unchanged  Goal: Absence of Hospital-Acquired Illness or Injury  Outcome: Ongoing - Unchanged  Goal: Optimal Comfort and Wellbeing  Outcome: Ongoing - Unchanged  Goal: Readiness for Transition of Care  Outcome: Ongoing - Unchanged  Goal: Rounds/Family Conference  Outcome: Ongoing - Unchanged     Problem: Fluid Volume Excess  Goal: Fluid Balance  Outcome: Ongoing - Unchanged     Problem: Fall Injury Risk  Goal: Absence of Fall and Fall-Related Injury  Outcome: Ongoing - Unchanged     Problem: Self-Care Deficit  Goal: Improved Ability to Complete Activities of Daily Living  Outcome: Ongoing - Unchanged     Problem: Gas Exchange Impaired  Goal: Optimal Gas Exchange  Outcome: Ongoing - Unchanged

## 2023-09-25 NOTE — Unmapped (Signed)
Clinical Pharmacy Non-Formulary Home Medication Evaluation Note     Team is requesting continuation of the patient's non-formulary home medication tafamidis. Per PolicyStat ID: [id number] (Medication Management: Patient-Supplied Medications,) the continued use of these medications during hospital admission was evaluated by pharmacy.      Recommendation: Continuation of non-formulary home medication tafamidis is clinically necessary and will be identified by pharmacy for continuation. Per policy, patient-supplied medications must be supplied by the patient for the duration of the encounter.     Rationale: This medication was approved for inpatient use by Lillette Boxer (pharmacy administration). This approval is good for 6 months and expires on 02/19/24     This information has been communicated to the ordering clinician. Pharmacy will continue to follow patient with team.  Please page the service pharmacist pager found in the [facility] Directory if further immediate assistance is required.    Thank you,     Richardean Canal, PharmD  PGY2 Cardiology Pharmacy Resident

## 2023-09-25 NOTE — Unmapped (Signed)
CICU Progress Note    Hospital Day: 2    Hospital Course:       9/27: Patient admitted for acute on chronic heart failure  9/28: Patient transferred to CICU in the setting of need for increased hemodynamic monitoring on inotrope assisted diuresis.    Subjective / Interval History:       Patient transition to CICU in the setting of ongoing poor urine output despite furosemide drip while on dobutamine to allow for better hemodynamic monitoring with Swan-Ganz catheter. .    Admits ongoing weakness and dizziness with shortness of breath on ambulation.  Denies any chest pain.    Assessment/Plan:        Principal Problem:    Acute exacerbation of CHF (congestive heart failure) (CMS-HCC)  Active Problems:    COPD (chronic obstructive pulmonary disease) (CMS-HCC)    Essential hypertension    Type 2 diabetes mellitus with stage 4 chronic kidney disease, with long-term current use of insulin (CMS-HCC)    Chronic respiratory failure with hypoxia (CMS-HCC)    Persistent atrial fibrillation (CMS-HCC)    Chronic kidney disease ([name]), stage IV (severe) (CMS-HCC)    Chronic prescription opiate use    Familial amyloid heart disease (CMS-HCC)      Barbara Huber is a 82 y.o. female with pertinent PMH of [name], cardiac amyloidosis, pulmonary hypertension, chronic respiratory failure on 3 L, COPD, [name] stage IV, atrial fibrillation, DM II who to the ED with acute on chronic heart failure exacerbation.     Neurological   Chronic Pain: Followed by the outpatient pain clinic [facility] in [place]. Takes OxyContin 12 mg 2 times a day, can continue current regimen inpatient with bowel regimen ordered.     Peripheral Neuropathy: continue gabapentin 300 mg BID     Pulmonary   Chronic Respiratory Failure - COPD - Pulmonary HTN: on 3L home O2 at baseline. Pt presented with O2 sats in the 80s requiring BiPap in the ED. On 4 L at time of transfer to ICU.   - Wean oxygen as tolerated  - Breo and Incruse inhalers in place of home inhalers  - continue home Singulair 10mg  daily   - PRN albuterol    Cardiovascular   Acute on Chronic [name] Exacerbation - TTR Cardiac Amyloidosis: Patient follows with cardiology outpatient in the cardiac amyloid clinic and has been seen in pulm HTN clinic as well. Home GDMT includes Torsemide 60 mg daily and PRN metolazone. Jardiance stopped due to UTIs and she had issues with hypotension with ARB and BB. For her hATTR amyloid with neuropathy leading to falls, she was started Amvuttra 25mg  q72mos in April 2024. 2nd injection July 2024. She is on Tafamidis as well. Admitted from diuresis clinic. Weight on admission 208 lbs. ProBNP 26,000. JVD to the mandible.  Transferred to the CICU for [facility] placement.  - s/p 120 mg IV lasix bolus followed by drip at 20 mg/hr (holding home Torsemide 60 mg daily and Metolazone 5mg  3x weekly)   - started on Dobutamine 2 overnight   - Strict Is/Os; daily weights  - Continue home tafamidis 61 mg daily; holding Amturra inpatient    Hx of Persistent Atrial Fibrillation: CHA2DS2-VASc score = 5 (1-gender, 2-Age, 1-DM, 1-HTN). Is on chronic amiodarone 200 mg daily for atrial fibrillation. She is no longer on apixaban due to to bleeding episodes and anemia in the past.  - Continue Amiodarone 200 mg dai     HLD: Holding home Crestor as not available and  patient had myopathy due to atorvastatin   Renal   Chronic Kidney Disease stage V: Baseline Cr 2.5-3. Creatinine on admission was 5.34. Follows with nephrology in the outpatient setting  - Diuresis as above  - Nephrology consulted this a.m. for assistance  - avoid hypotension and nephrotoxins  - renally dose all meds    Infectious Disease   NAI    FEN/GI   GERD: continue home protonix 20 mg daily       Heme/Coag   NAI    Endocrine   DM II: Not currently on treatment. A1C 5.8 on 08/30/23     Prophylaxis   ? VTE: Heparin SQ  ? GI: Not indicated      Code Status: Full Code    Dispo: Pending Clinical Course      Objective:      Vitals - past 24 hours  Temp:  [35.9 ??C (96.7 ??F)-37.5 ??C (99.5 ??F)] 36.3 ??C (97.3 ??F)  Heart Rate:  [54-73] 73  Resp:  [15-22] 19  BP: (100-126)/(49-89) 123/89  FiO2 (%):  [40 %] 40 %  SpO2:  [93 %-99 %] 94 % Intake/Output  I/O last 3 completed shifts:  In: 180 [P.O.:180]  Out: 625 [Urine:625]     Physical Exam:    General: well-appearing, no acute distress  HEENT: PERRL, OP clear without lesions  CV: RRR, no m/r/g  Lungs: Faint crackles in bilateral lower bases  Abd: soft, non-tender, non-distended, +bs  Skin: no visible lesions or rashes  Neuro: alert and oriented, no gross focal deficits    @LDAPRESSUREULCER @    Continuous Infusions:    DOBUTamine (DOBUTREX) 2 mcg/kg/min (09/25/23 0644)    furosemide 20 mg/hr (09/25/23 0526)       Vent settings for last 24 hours:  FiO2 (%): 40 %    Tubes and Drains:  Patient Lines/Drains/Airways Status       Active Active Lines, Drains, & Airways       Name Placement date Placement time Site Days    Urethral Catheter 16 Fr. 09/24/23  1818  --  less than 1    Peripheral IV 09/24/23 Left Antecubital 09/24/23  1324  Antecubital  1    Peripheral IV 09/25/23 Anterior;Distal;Left Forearm 09/25/23  0644  Forearm  less than 1                    Data Review:   Recent Labs     09/24/23  1325 09/25/23  0637   WBC 6.9 6.7   HGB 13.0 11.8   HCT 40.2 36.8   PLT 144* 133*     Recent Labs     09/24/23  1325 09/25/23  0637   NA 128* 132*   K 4.8 4.3   CL 98 98   CO2 20.0 24.0   BUN 89* 110*   CREATININE 5.34* 5.41*   GLU 174 103   MG 2.4 2.5      Recent Labs     09/24/23  1325   BILITOT 0.5   PROT 7.3   ALBUMIN 3.8   ALT 12   AST 24   ALKPHOS 154*      Recent Labs     09/24/23  1325   INR 1.04

## 2023-09-25 NOTE — Unmapped (Signed)
[facility] Advanced Heart Failure/Transplant/LVAD Cardiology (MDD) progress note       Assessment and Plan:     Barbara Huber is a 82 y.o. female with pertinent PMH of [name], cardiac amyloidosis, pulmonary hypertension, chronic respiratory failure on 3 L, COPD, [name] stage IV, atrial fibrillation, DM II who to the ED with acute on chronic heart failure exacerbation.     Principal Problem:    Acute exacerbation of CHF (congestive heart failure) (CMS-HCC)  Active Problems:    COPD (chronic obstructive pulmonary disease) (CMS-HCC)    Essential hypertension    Type 2 diabetes mellitus with stage 4 chronic kidney disease, with long-term current use of insulin (CMS-HCC)    Chronic respiratory failure with hypoxia (CMS-HCC)    Persistent atrial fibrillation (CMS-HCC)    Chronic kidney disease ([name]), stage IV (severe) (CMS-HCC)    Chronic prescription opiate use    Familial amyloid heart disease (CMS-HCC)    Acute on Chronic [name] Exacerbation - TTR Cardiac Amyloidosis: Patient follows with cardiology outpatient in the cardiac amyloid clinic and has been seen in pulm HTN clinic as well. Home GDMT includes Torsemide 60 mg daily and PRN metolazone. Jardiance stopped due to UTIs and she had issues with hypotension with ARB and BB. For her hATTR amyloid with neuropathy leading to falls, she was started Amvuttra 25mg  q29mos in April 2024. 2nd injection July 2024. She is on Tafamidis as well. Admitted from diuresis clinic. Weight on admission 208 lbs. ProBNP 26,000. JVD to the mandible.   - s/p 120 mg IV lasix bolus followed by drip at 20 mg/hr (holding home Torsemide 60 mg daily and Metolazone 5mg  3x weekly)   - started on Dobutamine 2 overnight   - Strict Is/Os; daily weights  - low threshold to transfer to CICU for line/swan if UOP not adequate   - Continue home tafamidis 61 mg daily; holding Amturra inpatient     Chronic Respiratory Failure - COPD - Pulmonary HTN: on 3L home O2 at baseline. Pt presented with O2 sats in the 80s requiring BiPap in the ED.   - Wean oxygen as tolerated  - Breo and Incruse inhalers in place of home inhalers  - continue home Singulair 10mg  daily   - PRN albuterol    Chronic Kidney Disease stage V: Baseline Cr 2.5-3. Creatinine on admission was 5.34. Follows with nephrology in the outpatient.   - Diuresis as above  - Nephrology consulted this a.m. for assistance  - avoid hypotension and nephrotoxins  - renally dose all meds    Hx of Persistent Atrial Fibrillation: CHA2DS2-VASc score = 5 (1-gender, 2-Age, 1-DM, 1-HTN). Is on chronic amiodarone 200 mg daily for atrial fibrillation. She is no longer on apixaban due to to bleeding episodes and anemia in the past.  - Continue Amiodarone 200 mg daily      Chronic Problems  Chronic Pain: Followed by the outpatient pain clinic [facility] in [place]. Takes OxyContin 12 mg 2 times a day, can continue current regimen inpatient with bowel regimen ordered.  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 A1C 5.8 on 08/30/23  Gout: Recent flare of left elbow and wrist that was treated with prednisone in August. Continue to monitor.     Prophylaxis: sub q heparin  Dispo: step down status  Code Status:  full code    Lorenda Hatchet, MD  09/25/2023  7:48 AM      ---------------------------------------------------------------------------------------------------------------------  Subjective:  Patient was seen and examined on rounds this morning.  She notes a Foley was placed overnight in the ED and has caused hematuria.  She is very concerned that there is urine in her Foley.  She notes that she is still volume up.  She admits to weakness and dizziness with standing.  This is making it difficult to ambulate to the restroom.  She admits to bilateral lower extremity edema and shortness of breath.  There are no other concerns at this time.  Review of systems negative unless otherwise stated above.    Current medications:    Current Facility-Administered Medications:     acetaminophen (TYLENOL) tablet 1,000 mg, 1,000 mg, Oral, Q8H PRN, Hatfield, Tiffany Schwab, AGNP    albuterol (PROVENTIL HFA;VENTOLIN HFA) 90 mcg/actuation inhaler 2 puff, 2 puff, Inhalation, Q8H PRN, Hatfield, Tiffany Schwab, AGNP    amiodarone (PACERONE) tablet 200 mg, 200 mg, Oral, Daily, Hatfield, Tiffany Schwab, AGNP, 200 mg at 09/25/23 0848    DOBUTamine 1,000 mg in dextrose 5% 250 ml (4,000 mcg/ml) infusion PMB, 2 mcg/kg/min, Intravenous, Continuous, Essig, Eustace Moore, MD, Last Rate: 2.83 mL/hr at 09/25/23 0644, 2 mcg/kg/min at 09/25/23 0644    fluticasone furoate-vilanterol (BREO ELLIPTA) 100-25 mcg/dose inhaler 1 puff, 1 puff, Inhalation, Daily (RT), Hatfield, Tiffany Schwab, AGNP, 1 puff at 09/25/23 0959    furosemide 200 mg in sodium chloride 0.9 % 100 mL (2 mg/mL) infusion, 20 mg/hr, Intravenous, Continuous, Hatfield, Tiffany Schwab, [place], Last Rate: 10 mL/hr at 09/25/23 0526, 20 mg/hr at 09/25/23 0526    gabapentin (NEURONTIN) capsule 100 mg, 100 mg, Oral, BID, Hatfield, Tiffany Schwab, AGNP, 100 mg at 09/25/23 0849    heparin (porcine) 5,000 unit/mL injection 5,000 Units, 5,000 Units, Subcutaneous, Q8H SCH, Hatfield, Tiffany Schwab, [place], 5,000 Units at 09/25/23 0523    montelukast (SINGULAIR) tablet 10 mg, 10 mg, Oral, Daily, Hatfield, Tiffany Schwab, AGNP, 10 mg at 09/25/23 0849    oxyCODONE (OxyCONTIN) 12 hr crush resistant ER/CR tablet 10 mg, 10 mg, Oral, Q12H SCH, Hatfield, Tiffany Schwab, AGNP, 10 mg at 09/25/23 0849    pantoprazole (Protonix) EC tablet 20 mg, 20 mg, Oral, Daily, Hatfield, Tiffany Schwab, AGNP, 20 mg at 09/25/23 0848    polyethylene glycol (MIRALAX) packet 17 g, 17 g, Oral, Daily PRN, Hatfield, Tiffany Schwab, AGNP, 17 g at 09/25/23 1121    senna (SENOKOT) tablet 1 tablet, 1 tablet, Oral, Nightly, Hatfield, Tiffany Schwab, [place], 1 tablet at 09/24/23 2111    tafamidis cap 61 mg **PATIENT-SUPPLIED**, 61 mg, Oral, Daily, Hatfield, Tiffany Schwab, AGNP, 61 mg at 09/25/23 1610    umeclidinium (INCRUSE ELLIPTA) 62.5 mcg/actuation inhaler 1 puff, 1 puff, Inhalation, Daily (RT), Hatfield, Tiffany Schwab, AGNP, 1 puff at 09/25/23 9604    vitamin A-3,000 mcg RAE (10,000 UNIT) capsule 3,000 mcg of RAE, 3,000 mcg of RAE, Oral, Daily, Hatfield, Tiffany Schwab, AGNP, 3,000 mcg of RAE at 09/25/23 0848      Home medications:  Current Outpatient Medications   Medication Instructions    acetaminophen (TYLENOL) 1,000 mg, Oral, 3 times a day (standard)    albuterol HFA 90 mcg/actuation inhaler 2 puffs, Inhalation, Every 8 hours PRN    amiodarone (PACERONE) 200 mg, Oral, Daily (standard)    AMVUTTRA 25 mg, Subcutaneous, Once, Every 12 Weeks    colchicine (COLCRYS) 0.6 mg tablet Take 0.3 mg (1/2 tablet) at  the first sign of a gout flare. OK to repeat 0.3mg  (1/2 tablet) after 3 days if symptoms have not resolved, then every 3 days until flare resolves.    cranberry 500 mg, Oral, Daily    ergocalciferol-1,250 mcg (50,000 unit) (DRISDOL) 1,250 mcg, Oral, Weekly    estradiol (ESTRACE) 2 g, Vaginal, 2 times a week    ferrous sulfate 325 mg, Oral, Every Monday, Wednesday, Friday    fluticasone propion-salmeterol (ADVAIR HFA) 115-21 mcg/actuation inhaler 2 puffs, Inhalation, 2 times a day (standard)    gabapentin (NEURONTIN) 100 mg, Oral, 2 times a day (standard)    metOLazone (ZAROXOLYN) 5 mg, Oral, Daily PRN    mirtazapine (REMERON) 7.5 MG tablet Take 7.5mg  (1 tablet) nightly for 1 week, then increase to 15mg  (2 tablets)    montelukast (SINGULAIR) 10 mg, Oral, Every morning    NARCAN 4 mg/actuation nasal spray 1 spray, Alternating Nares, Once as needed, PRN - Emergency use.    NON FORMULARY     OxyCONTIN 10 mg, Oral, Every 12 hours    OXYGEN-AIR DELIVERY SYSTEMS MISC 5 L, Miscellaneous, Currently using 3  L/min via [place] pantoprazole (PROTONIX) 20 mg, Oral, Daily (standard), TAKE 1 TABLET(20 MG) BY MOUTH DAILY    rosuvastatin (CRESTOR) 5 MG tablet TAKE 1 TABLET EVERY OTHER DAY    tafamidis (VYNDAMAX) 61 mg cap Take 1 capsule (61 mg) by mouth daily.    tiotropium bromide (SPIRIVA RESPIMAT) 2.5 mcg/actuation inhalation mist 2 puffs, Inhalation, Daily (standard)    torsemide (DEMADEX) 60 mg, Oral, Daily (standard)    vitamin A-3,000 mcg RAE, 10,000 UNIT, 3,000 mcg RAE (10,000 UNIT) capsule 3,000 mcg of RAE, Oral, Daily (standard)         Physical Exam  BP 100/59  - Pulse 55  - Temp 36.3 ??C (97.4 ??F) (Temporal)  - Resp 15  - SpO2 99%   Wt Readings from Last 3 Encounters:   09/24/23 94.3 kg (208 lb)   09/23/23 93.9 kg (207 lb)   09/23/23 94 kg (207 lb 3.2 oz)         There is no height or weight on file to calculate BMI.    Gen: NAD, converses   Neck: Trachea midline.  JVP  to the mandible at 45 degrees .   Heart :  Normal S1, S2. No murmur, no gallop or rub.  Radial and pedal pulses are 2+, bilaterally.     Lungs: Decreased breath sounds at bases.  Abdomen: Soft, NTND. Liver is nonpalpable.  Extremities: no pedal edema, bilaterally.   Skin:  No rashes, lesions noted, normal turgor  Neuro/Psych: Alert, oriented. Nonfocal       Labs & Imaging:  Reviewed in Epic  Lab Results   Component Value Date    WBC 6.7 09/25/2023    HGB 11.8 09/25/2023    HCT 36.8 09/25/2023    PLT 133 (L) 09/25/2023       Lab Results   Component Value Date    NA 132 (L) 09/25/2023    K 4.3 09/25/2023    CL 98 09/25/2023    CO2 24.0 09/25/2023    BUN 110 (H) 09/25/2023    CREATININE 5.41 (H) 09/25/2023    GLU 103 09/25/2023    CALCIUM 8.8 09/25/2023    MG 2.5 09/25/2023    PHOS 4.5 08/31/2023       Lab Results   Component Value Date    BILITOT 0.5 09/24/2023    BILIDIR 0.30 08/21/2023  PROT 7.3 09/24/2023    ALBUMIN 3.8 09/24/2023    ALT 12 09/24/2023    AST 24 09/24/2023    ALKPHOS 154 (H) 09/24/2023    GGT 215 (H) 09/29/2020       Lab Results   Component Value Date    PT 11.6 09/24/2023    INR 1.04 09/24/2023    APTT 46.9 (H) 01/10/2021           EKG: 09/24/23  WIDE QRS RHYTHM  INTRAVENTRICULAR CONDUCTION DELAY  INFERIOR INFARCT , AGE UNDETERMINED  POSSIBLE ANTEROLATERAL INFARCT  , AGE UNDETERMINED    Chest x-ray: 09/24/23  Minimal change in portable imaging of chest relative to similar imaging from 1 day prior. Notably, there is similar enlargement of the cardiac silhouette and persistent, but improved, pulmonary edema. Small effusions may contribute to opacification of the costophrenic angles.        Most recent pertinent Cardiac Studies:  Echocardiogram: 08/19/23   1. The left ventricle is normal in size with moderately increased wall thickness.    2. 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    3. The left ventricular systolic function is hyperdynamic, LVEF is visually estimated at >70%.    4. Mitral annular calcification is present (moderate).    5. The mitral valve leaflets are mildly thickened with normal leaflet mobility.    6. The aortic valve is trileaflet with mildly thickened leaflets with normal excursion.    7. The left atrium is moderately dilated in size.    8. The right ventricle is normal in size, with normal systolic function.

## 2023-09-25 NOTE — Unmapped (Signed)
Pt had cpap on during the night.  Pt has diminish urine production.    Problem: Adult Inpatient Plan of Care  Goal: Plan of Care Review  09/25/2023 0400 by Starr Lake, RN  Outcome: Ongoing - Unchanged  09/25/2023 0400 by Starr Lake, RN  Outcome: Ongoing - Unchanged  Goal: Patient-Specific Goal (Individualized)  Outcome: Ongoing - Unchanged  Goal: Absence of Hospital-Acquired Illness or Injury  Outcome: Ongoing - Unchanged  Intervention: Identify and Manage Fall Risk  Recent Flowsheet Documentation  Taken 09/25/2023 0200 by Starr Lake, RN  Safety Interventions:   bleeding precautions   commode/urinal/bedpan at bedside   environmental modification   fall reduction program maintained   family at bedside   latex precautions   lighting adjusted for tasks/safety   low bed  Taken 09/24/2023 2200 by Starr Lake, RN  Safety Interventions:   bleeding precautions   commode/urinal/bedpan at bedside   fall reduction program maintained   environmental modification   family at bedside   lighting adjusted for tasks/safety   low bed  Taken 09/24/2023 2000 by Starr Lake, RN  Safety Interventions:   bleeding precautions   commode/urinal/bedpan at bedside   fall reduction program maintained   family at bedside   isolation precautions   lighting adjusted for tasks/safety   low bed   mobility aid   nonskid shoes/slippers when out of bed  Intervention: Prevent Skin Injury  Recent Flowsheet Documentation  Taken 09/25/2023 0200 by Starr Lake, RN  Positioning for Skin: Supine/Back  Taken 09/25/2023 0000 by Starr Lake, RN  Positioning for Skin: Supine/Back  Taken 09/24/2023 2200 by Starr Lake, RN  Positioning for Skin: Supine/Back  Taken 09/24/2023 2000 by Starr Lake, RN  Positioning for Skin: Supine/Back  Intervention: Prevent and Manage VTE (Venous Thromboembolism) Risk  Recent Flowsheet Documentation  Taken 09/25/2023 0000 by Starr Lake, RN  VTE Prevention/Management: anticoagulant therapy  Anti-Embolism Intervention: (subq heparin) Other (Comment)  Taken 09/24/2023 2200 by Starr Lake, RN  Anti-Embolism Intervention: (subq heparin) Other (Comment)  Taken 09/24/2023 2000 by Starr Lake, RN  VTE Prevention/Management:   anticoagulant therapy   bleeding precautions maintained  Anti-Embolism Intervention: (subq heparin) Other (Comment)  Intervention: Prevent Infection  Recent Flowsheet Documentation  Taken 09/25/2023 0200 by Starr Lake, RN  Infection Prevention:   rest/sleep promoted   single patient room provided   visitors restricted/screened  Taken 09/24/2023 2200 by Starr Lake, RN  Infection Prevention:   cohorting utilized   hand hygiene promoted   personal protective equipment utilized   rest/sleep promoted   single patient room provided   visitors restricted/screened   environmental surveillance performed   equipment surfaces disinfected  Taken 09/24/2023 2000 by Starr Lake, RN  Infection Prevention:   cohorting utilized   environmental surveillance performed   equipment surfaces disinfected   hand hygiene promoted   rest/sleep promoted   single patient room provided   visitors restricted/screened  Goal: Optimal Comfort and Wellbeing  Outcome: Ongoing - Unchanged  Goal: Readiness for Transition of Care  Outcome: Ongoing - Unchanged  Goal: Rounds/Family Conference  Outcome: Ongoing - Unchanged     Problem: Fluid Volume Excess  Goal: Fluid Balance  Outcome: Ongoing - Unchanged     Problem: Fall Injury Risk  Goal: Absence of Fall and Fall-Related Injury  Outcome: Ongoing - Unchanged  Intervention: Promote Injury-Free Environment  Recent Flowsheet Documentation  Taken 09/25/2023 0200 by Starr Lake, RN  Safety Interventions:   bleeding precautions   commode/urinal/bedpan at bedside  environmental modification   fall reduction program maintained   family at bedside   latex precautions   lighting adjusted for tasks/safety   low bed  Taken 09/24/2023 2200 by Starr Lake, RN  Safety Interventions:   bleeding precautions   commode/urinal/bedpan at bedside   fall reduction program maintained   environmental modification   family at bedside   lighting adjusted for tasks/safety   low bed  Taken 09/24/2023 2000 by Starr Lake, RN  Safety Interventions:   bleeding precautions   commode/urinal/bedpan at bedside   fall reduction program maintained   family at bedside   isolation precautions   lighting adjusted for tasks/safety   low bed   mobility aid   nonskid shoes/slippers when out of bed     Problem: Self-Care Deficit  Goal: Improved Ability to Complete Activities of Daily Living  Outcome: Ongoing - Unchanged     Problem: Gas Exchange Impaired  Goal: Optimal Gas Exchange  Outcome: Ongoing - Unchanged  Intervention: Optimize Oxygenation and Ventilation  Recent Flowsheet Documentation  Taken 09/24/2023 2200 by Starr Lake, RN  Head of Bed [facility]) Positioning: HOB at 30-45 degrees  Taken 09/24/2023 2000 by Starr Lake, RN  Head of Bed [facility]) Positioning: HOB at 30-45 degrees

## 2023-09-25 NOTE — Unmapped (Signed)
Pt is calm, cooperative, made two attempts to make a BM, no results yet. Pt is on Lasix drip and Dobutamine drip. UO is inadequate. Urine is bloody - traumatic Foley insertion at ED. Team was updated. Orders received to transfer pt to ICU for closer hemodynamic monitoring. Report given to CICU RN. Pt is moving to room # 3728.     Problem: Adult Inpatient Plan of Care  Goal: Absence of Hospital-Acquired Illness or Injury  Intervention: Identify and Manage Fall Risk  Recent Flowsheet Documentation  Taken 09/25/2023 0809 by Laural Golden D, RN  Safety Interventions:   bariatric safety   family at bedside   fall reduction program maintained   low bed  Intervention: Prevent Skin Injury  Recent Flowsheet Documentation  Taken 09/25/2023 1224 by Laural Golden D, RN  Positioning for Skin: Sitting in Chair  Taken 09/25/2023 1012 by Laural Golden D, RN  Positioning for Skin: Supine/Back  Taken 09/25/2023 0809 by Laural Golden D, RN  Positioning for Skin: Supine/Back  Skin Protection:   adhesive use limited   incontinence pads utilized  Intervention: Prevent and Manage VTE (Venous Thromboembolism) Risk  Recent Flowsheet Documentation  Taken 09/25/2023 1200 by [place], Kyla Balzarine D, RN  Anti-Embolism Intervention: (heparin SQ) Other (Comment)  Taken 09/25/2023 0809 by Laural Golden D, RN  VTE Prevention/Management: anticoagulant therapy  Anti-Embolism Intervention: (heparin SQ) Other (Comment)  Intervention: Prevent Infection  Recent Flowsheet Documentation  Taken 09/25/2023 0809 by Laural Golden D, RN  Infection Prevention: hand hygiene promoted     Problem: Fluid Volume Excess  Goal: Fluid Balance  Intervention: Monitor and Manage Hypervolemia  Recent Flowsheet Documentation  Taken 09/25/2023 0809 by Laural Golden D, RN  Skin Protection:   adhesive use limited   incontinence pads utilized     Problem: Fall Injury Risk  Goal: Absence of Fall and Fall-Related Injury  Intervention: Promote Injury-Free Environment  Recent Flowsheet Documentation  Taken 09/25/2023 0809 by [place], Kyla Balzarine D, RN  Safety Interventions:   bariatric safety   family at bedside   fall reduction program maintained   low bed     Problem: Gas Exchange Impaired  Goal: Optimal Gas Exchange  Intervention: Optimize Oxygenation and Ventilation  Recent Flowsheet Documentation  Taken 09/25/2023 0809 by Laural Golden D, RN  Head of Bed [facility]) Positioning: [facility] elevated     Problem: Heart Failure  Goal: Optimal Functional Ability  Intervention: Optimize Functional Ability  Recent Flowsheet Documentation  Taken 09/25/2023 0809 by Laural Golden D, RN  Activity Management: back to bed  Goal: Effective Oxygenation and Ventilation  Intervention: Promote Airway Secretion Clearance  Recent Flowsheet Documentation  Taken 09/25/2023 0809 by Laural Golden D, RN  Activity Management: back to bed  Intervention: Optimize Oxygenation and Ventilation  Recent Flowsheet Documentation  Taken 09/25/2023 0809 by Laural Golden D, RN  Head of Bed [facility]) Positioning: HOB elevated

## 2023-09-25 NOTE — Unmapped (Signed)
Patient continued on NPPV BIPAP 10 / +5 / 40% overnight this shift. See flow sheets for more detail. No other changes or respiratory interventions made at this time. RT reassessed throughout the shift for changes in the patient's respiratory status.    Problem: Noninvasive Ventilation Acute  Goal: Effective Unassisted Ventilation and Oxygenation  Outcome: Ongoing - Unchanged

## 2023-09-26 LAB — BASIC METABOLIC PANEL
ANION GAP: 5 mmol/L (ref 5–14)
ANION GAP: 2 mmol/L — ABNORMAL LOW (ref 5–14)
BLOOD UREA NITROGEN: 102 mg/dL — ABNORMAL HIGH (ref 9–23)
BLOOD UREA NITROGEN: 100 mg/dL — ABNORMAL HIGH (ref 9–23)
BUN / CREAT RATIO: 23
BUN / CREAT RATIO: 27
CALCIUM: 8.3 mg/dL — ABNORMAL LOW (ref 8.7–10.4)
CALCIUM: 8.1 mg/dL — ABNORMAL LOW (ref 8.7–10.4)
CHLORIDE: 100 mmol/L (ref 98–107)
CHLORIDE: 103 mmol/L (ref 98–107)
CO2: 28 mmol/L (ref 20.0–31.0)
CO2: 29 mmol/L (ref 20.0–31.0)
CREATININE: 4.4 mg/dL — ABNORMAL HIGH
CREATININE: 3.77 mg/dL — ABNORMAL HIGH
EGFR CKD-EPI (2021) FEMALE: 10 mL/min/{1.73_m2} — ABNORMAL LOW (ref >=60)
EGFR CKD-EPI (2021) FEMALE: 12 mL/min/{1.73_m2} — ABNORMAL LOW (ref >=60)
GLUCOSE RANDOM: 123 mg/dL (ref 70–179)
GLUCOSE RANDOM: 107 mg/dL (ref 70–179)
POTASSIUM: 3.8 mmol/L (ref 3.4–4.8)
POTASSIUM: 4 mmol/L (ref 3.4–4.8)
SODIUM: 133 mmol/L — ABNORMAL LOW (ref 135–145)
SODIUM: 134 mmol/L — ABNORMAL LOW (ref 135–145)

## 2023-09-26 LAB — URINALYSIS WITH MICROSCOPY
BILIRUBIN UA: NEGATIVE
GLUCOSE UA: NEGATIVE
HYALINE CASTS: 3 /LPF — ABNORMAL HIGH (ref 0–1)
KETONES UA: NEGATIVE
NITRITE UA: NEGATIVE
PH UA: 5 (ref 5.0–9.0)
PROTEIN UA: NEGATIVE
RBC UA: >182 /HPF — ABNORMAL HIGH (ref <=4)
SPECIFIC GRAVITY UA: 1.007 (ref 1.003–1.030)
SQUAMOUS EPITHELIAL: 1 /HPF (ref 0–5)
UROBILINOGEN UA: <2
WBC UA: 22 /HPF — ABNORMAL HIGH (ref 0–5)

## 2023-09-26 LAB — CBC
HEMATOCRIT: 34.5 % (ref 34.0–44.0)
HEMOGLOBIN: 11.2 g/dL — ABNORMAL LOW (ref 11.3–14.9)
MEAN CORPUSCULAR HEMOGLOBIN CONC: 32.5 g/dL (ref 32.0–36.0)
MEAN CORPUSCULAR HEMOGLOBIN: 30.2 pg (ref 25.9–32.4)
MEAN CORPUSCULAR VOLUME: 92.7 fL (ref 77.6–95.7)
MEAN PLATELET VOLUME: 9.1 fL (ref 6.8–10.7)
PLATELET COUNT: 119 10*9/L — ABNORMAL LOW (ref 150–450)
RED BLOOD CELL COUNT: 3.72 10*12/L — ABNORMAL LOW (ref 3.95–5.13)
RED CELL DISTRIBUTION WIDTH: 15 % (ref 12.2–15.2)
WBC ADJUSTED: 7.1 10*9/L (ref 3.6–11.2)

## 2023-09-26 LAB — ALBUMIN / CREATININE URINE RATIO
ALBUMIN QUANT URINE: 4.5 mg/dL
ALBUMIN/CREATININE RATIO: 134.7 ug/mg — ABNORMAL HIGH (ref 0.0–30.0)
CREATININE, URINE: 33.4 mg/dL

## 2023-09-26 LAB — SODIUM, URINE, RANDOM: SODIUM URINE: 85 mmol/L

## 2023-09-26 LAB — MONOCLONAL GAMMOPATHY CHEMISTRIES
GAMMAGLOBULIN; IGA: 314.2 mg/dL (ref 70.0–400.0)
GAMMAGLOBULIN; IGG: 1218 mg/dL (ref 650–1600)
PROTEIN TOTAL (SPECIAL CHEM): 6.2 g/dL (ref 5.7–8.2)

## 2023-09-26 LAB — HIGH SENSITIVITY TROPONIN I - SINGLE: HIGH SENSITIVITY TROPONIN I: 201 ng/L (ref <=34)

## 2023-09-26 LAB — PROTEIN / CREATININE RATIO, URINE
CREATININE, URINE: 32.5 mg/dL
PROTEIN URINE: 9.1 mg/dL
PROTEIN/CREAT RATIO, URINE: 0.28

## 2023-09-26 LAB — MAGNESIUM
MAGNESIUM: 2.3 mg/dL (ref 1.6–2.6)
MAGNESIUM: 2.2 mg/dL (ref 1.6–2.6)

## 2023-09-26 LAB — URINE TOTAL PROTEIN FOR UPE: PROTEIN URINE: 9.1 mg/dL

## 2023-09-26 MED ADMIN — potassium chloride ER tablet 20 mEq: 20 meq | ORAL | @ 11:00:00 | Stop: 2023-09-26

## 2023-09-26 MED ADMIN — heparin (porcine) 5,000 unit/mL injection 5,000 Units: 5000 [IU] | SUBCUTANEOUS | @ 18:00:00

## 2023-09-26 MED ADMIN — gabapentin (NEURONTIN) capsule 100 mg: 100 mg | ORAL | @ 01:00:00

## 2023-09-26 MED ADMIN — tafamidis cap 61 mg **PATIENT-SUPPLIED**: 61 mg | ORAL | @ 14:00:00

## 2023-09-26 MED ADMIN — acetaminophen (TYLENOL) tablet 1,000 mg: 1000 mg | ORAL | @ 07:00:00

## 2023-09-26 MED ADMIN — sodium chloride (NS) 0.9 % infusion: 10 mL/h | INTRAVENOUS | @ 12:00:00

## 2023-09-26 MED ADMIN — polyethylene glycol (MIRALAX) packet 17 g: 17 g | ORAL | @ 14:00:00 | Stop: 2023-09-26

## 2023-09-26 MED ADMIN — gabapentin (NEURONTIN) capsule 100 mg: 100 mg | ORAL | @ 14:00:00

## 2023-09-26 MED ADMIN — amiodarone (PACERONE) tablet 200 mg: 200 mg | ORAL | @ 14:00:00

## 2023-09-26 MED ADMIN — oxyCODONE (OxyCONTIN) 12 hr crush resistant ER/CR tablet 10 mg: 10 mg | ORAL | @ 01:00:00 | Stop: 2023-10-08

## 2023-09-26 MED ADMIN — furosemide 200 mg in sodium chloride 0.9 % 100 mL (2 mg/mL) infusion: 20 mg/h | INTRAVENOUS | @ 19:00:00

## 2023-09-26 MED ADMIN — fluticasone furoate-vilanterol (BREO ELLIPTA) 100-25 mcg/dose inhaler 1 puff: 1 | RESPIRATORY_TRACT | @ 12:00:00

## 2023-09-26 MED ADMIN — montelukast (SINGULAIR) tablet 10 mg: 10 mg | ORAL | @ 14:00:00

## 2023-09-26 MED ADMIN — heparin (porcine) 5,000 unit/mL injection 5,000 Units: 5000 [IU] | SUBCUTANEOUS | @ 03:00:00

## 2023-09-26 MED ADMIN — heparin (porcine) 5,000 unit/mL injection 5,000 Units: 5000 [IU] | SUBCUTANEOUS | @ 10:00:00

## 2023-09-26 MED ADMIN — potassium chloride ER tablet 20 mEq: 20 meq | ORAL | @ 04:00:00 | Stop: 2023-09-25

## 2023-09-26 MED ADMIN — oxyCODONE (OxyCONTIN) 12 hr crush resistant ER/CR tablet 10 mg: 10 mg | ORAL | @ 14:00:00 | Stop: 2023-10-08

## 2023-09-26 MED ADMIN — pantoprazole (Protonix) EC tablet 20 mg: 20 mg | ORAL | @ 14:00:00

## 2023-09-26 MED ADMIN — umeclidinium (INCRUSE ELLIPTA) 62.5 mcg/actuation inhaler 1 puff: 1 | RESPIRATORY_TRACT | @ 12:00:00

## 2023-09-26 MED ADMIN — senna (SENOKOT) tablet 1 tablet: 1 | ORAL | @ 01:00:00

## 2023-09-26 MED ADMIN — vitamin A-3,000 mcg RAE (10,000 UNIT) capsule 3,000 mcg of RAE: 3000 ug | ORAL | @ 14:00:00

## 2023-09-26 MED ADMIN — furosemide 200 mg in sodium chloride 0.9 % 100 mL (2 mg/mL) infusion: 20 mg/h | INTRAVENOUS | @ 08:00:00

## 2023-09-26 NOTE — Unmapped (Signed)
CICU Progress Note    Hospital Day: 3    Hospital Course:       9/27: Patient admitted for acute on chronic heart failure  9/28: Patient transferred to CICU in the setting of need for increased hemodynamic with PA catheter monitoring on inotrope assisted diuresis. Net -2 L on lasix drip and dopamine infusion.   9/29: Diuresis continued. No significant changes.     Subjective / Interval History:       Barbara Huber notes some ongoing shortness of breath and concerned about flicking hand motions.  Otherwise reports feeling okay.  She has not been able to sit up for very long due to her fatigue.    In the evening of 9/28 had a Swan-Ganz catheter placed bedside for better monitoring of her hemodynamics.    Assessment/Plan:        Principal Problem:    Acute exacerbation of CHF (congestive heart failure) (CMS-HCC)  Active Problems:    COPD (chronic obstructive pulmonary disease) (CMS-HCC)    Essential hypertension    Type 2 diabetes mellitus with stage 4 chronic kidney disease, with long-term current use of insulin (CMS-HCC)    Chronic respiratory failure with hypoxia (CMS-HCC)    Persistent atrial fibrillation (CMS-HCC)    Chronic kidney disease ([name]), stage IV (severe) (CMS-HCC)    Chronic prescription opiate use    Familial amyloid heart disease (CMS-HCC)      Barbara Huber is a 82 y.o. female with pertinent PMH of [name], cardiac amyloidosis, pulmonary hypertension, chronic respiratory failure on 3 L, COPD, [name] stage IV, atrial fibrillation, DM II who to the ED with acute on chronic heart failure exacerbation now undergoing inotrope assisted diuresis.     Neurological   Chronic Pain: Followed by the outpatient pain clinic [facility] in [place]. Takes OxyContin 12 mg 2 times a day, can continue current regimen inpatient with bowel regimen ordered.     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 Pt presented with O2 sats in the 80s requiring BiPap in the ED. on 5 L as of 9/29 with an inability to wean so far..   - Wean oxygen as tolerated  - Breo Ellipta inhaler 1 puff daily  - Incruse Ellipta inhaler 1 puff daily  - continue home Singulair 10mg  daily   - PRN albuterol    Cardiovascular   Acute on Chronic [name] Exacerbation - TTR Cardiac Amyloidosis:   Patient follows with cardiology outpatient in the cardiac amyloid clinic and has been seen in pulm HTN clinic as well. Home GDMT includes Torsemide 60 mg daily and PRN metolazone. Jardiance stopped due to UTIs and she had issues with hypotension with ARB and BB. For her hATTR amyloid with neuropathy leading to falls, she was started Amvuttra 25mg  q17mos in April 2024. 2nd injection July 2024. She is on Tafamidis as well. Admitted from diuresis clinic.  Currently undergoing ongoing inotrope assisted diuresis with furosemide drip with robust urine output since starting dopamine. Heart rates within normal limits at this time so we will continue on dopamine and Lasix drip.  -Furosemide gtt. 20 mg/h  -Dopamine 2 mcg/KG/min  - Strict Is/Os; daily weights  - Continue home tafamidis 61 mg daily; holding Amturra inpatient  -Vitamin A-3000 mcg RAE daily in  setting of Amvuttra prescription    Hx of Persistent Atrial Fibrillation: CHA2DS2-VASc score = 5 (1-gender, 2-Age, 1-DM, 1-HTN). Is on chronic amiodarone 200 mg daily for atrial fibrillation. She is no longer on apixaban due to to bleeding episodes and anemia in the past.  - Continue Amiodarone 200 mg dai     HLD: Holding home Crestor as not available and patient had myopathy due to atorvastatin   Renal   Chronic Kidney Disease stage V: Baseline Cr 2.5-3. Creatinine on admission was 5.34. Follows with nephrology in the outpatient setting.  Renal function is improved in recent days in the setting of ongoing diuresis.  In discussions with nephrology there is a low concern for any amyloid activity in her kidneys given absence of significant proteinuria.  Instead they believe this likely secondary to congestion.  It given improvement since initiation of diuresis we will continue to address this primarily with ongoing diuresis.  - Diuresis as above  - Nephrology consulted, appreciate assistance  - avoid hypotension and nephrotoxins  - renally dose all meds    Infectious Disease   NAI    FEN/GI   GERD: continue home protonix 20 mg daily       Heme/Coag   NAI    Endocrine   DM II: Not currently on treatment. A1C 5.8 on 08/30/23     Prophylaxis   ? VTE: Heparin SQ  ? GI: Not indicated      Code Status: Full Code    Dispo: Pending Clinical Course      Objective:      Vitals - past 24 hours  Temp:  [35.9 ??C (96.7 ??F)-37.6 ??C (99.7 ??F)] 37.6 ??C (99.7 ??F)  Heart Rate:  [62-74] 68  SpO2 Pulse:  [70-71] 70  Resp:  [10-26] 15  BP: (71-151)/(38-89) 119/49  SpO2:  [85 %-98 %] 92 % Intake/Output  I/O last 3 completed shifts:  In: 1125.6 [P.O.:770; I.V.:355.6]  Out: 3075 [Urine:3075]     Physical Exam:    General: well-appearing, no acute distress  HEENT: PERRL, OP clear without lesions  CV: RRR, no m/r/g  Lungs: Faint crackles in bilateral lower bases  Abd: soft, non-tender, non-distended, +bs  Skin: no visible lesions or rashes.  Catheter third time thank you  Neuro: Occasional jerking motions of hands.  Otherwise neurologically within normal limits.    @LDAPRESSUREULCER @    Continuous Infusions:    DOPamine 2 mcg/kg/min (09/26/23 1000)    furosemide 20 mg/hr (09/26/23 1000)    sodium chloride 10 mL/hr (09/26/23 1000)       Vent settings for last 24 hours:       Tubes and Drains:  Patient Lines/Drains/Airways Status       Active Active Lines, Drains, & Airways       Name Placement date Placement time Site Days    Introducer 09/25/23 Internal jugular Right 09/25/23  1754  Internal jugular  less than 1    PA Catheter 09/25/23 Internal jugular Right 09/25/23  1800  Internal jugular  less than 1    Urethral Catheter 16 Fr. 09/24/23  1818  --  1    Peripheral IV 09/24/23 Left Antecubital 09/24/23  1324  Antecubital  1    Peripheral IV 09/25/23 Anterior;Distal;Left Forearm 09/25/23  0644  Forearm  1                    Data Review:   Recent Labs     09/25/23  1610 09/26/23  0530   WBC 6.7 7.1   HGB 11.8 11.2*   HCT 36.8 34.5   PLT 133* 119*     Recent Labs     09/25/23  2230 09/26/23  0500   NA 130* 133*   K 3.8 3.8   CL 100 100   CO2 26.0 28.0   BUN 101* 102*   CREATININE 4.75* 4.40*   GLU 147 123   MG 2.4 2.3      Recent Labs     09/24/23  1325 09/25/23  2344   BILITOT 0.5  --    PROT 7.3 6.2   ALBUMIN 3.8  --    ALT 12  --    AST 24  --    ALKPHOS 154*  --       Recent Labs     09/24/23  1325   INR 1.04       Gaylord Shih, MD  PGY-2   [facility] Internal Medicine

## 2023-09-26 NOTE — Unmapped (Signed)
Procedure: PA catheter placement   Indication: Hemodynamic monitoring    Informed consent obtained from appropriate party, witnessed and documented in chart, labs reviewed, medications and allergies reviewed, appropriate barrier precautions utilized. Prior to start of procedure a timeout was performed.     Under sterile conditions, via RIJ cordis, the Swan-Ganz catheter was advanced using waveforms until CVP tracing was obtained. The balloon was inflated during forward advancement, and deflated when the catheter was retracted. RA, RV, and PA waveforms were appreciated via monitor.     RA 18 mm Hg  RV 70/20 mm Hg  PA 70/40 mm Hg  PCWP 28 mm Hg    The balloon was deflated and pulled back to PA. Correct position was confirmed via waveform and the catheter was left in locked position at 57 cm. CXR was ordered to confirm placement.    Complications: None. The patient tolerated the procedure well and vital signs remained stable throughout.    -----------------------------------    Marin Olp, MD  [facility] Cardiovascular Medicine Fellow

## 2023-09-26 NOTE — Unmapped (Signed)
Pt arrived at 1600 on dobutamine and lasix.  EKG done and CHG bath done.  Introducer placed with CVAD nurse and patient medicated with prn versed for procedure.  Swan then placed with Dr. Allena Katz, locked at 57 cm.  Dobutamine stopped and dopamine started per order.  Foley continues to have bloody urine.  Requested Dr. Warren Lacy order x-ray to confirm swan placement.  Calibrated SVO2 swan and obtained flat CVP & PA pressures prior to shift change (see flowsheets).  Report given to Rosey Bath, RN and handoff at bedside, oncoming RN troubleshooting continuous CCO readings.       Problem: Adult Inpatient Plan of Care  Goal: Patient-Specific Goal (Individualized)  Outcome: Progressing     Problem: Fluid Volume Excess  Goal: Fluid Balance  Outcome: Progressing     Problem: Heart Failure  Goal: Stable Heart Rate and Rhythm  Problem: Heart Failure  Goal: Optimal Cardiac Output  Outcome: Progressing     Outcome: Progressing     Problem: Gas Exchange Impaired  Goal: Optimal Gas Exchange  Outcome: Progressing  Intervention: Optimize Oxygenation and Ventilation  Recent Flowsheet Documentation  Taken 09/25/2023 1800 by Orlean Bradford, RN  Head of Bed [facility]) Positioning: HOB at 30-45 degrees     Problem: Noninvasive Ventilation Acute  Goal: Effective Unassisted Ventilation and Oxygenation  Outcome: Progressing

## 2023-09-26 NOTE — Unmapped (Signed)
CVAD Liaison - Insertion Note      The CVAD Liaison was contacted for the insertion of Central Venous Access Device (CVAD).  A chart review performed.   Indication: Hemodynamic monitoring    Prior to the start of the procedure, a time out was performed and the identity of the patient was confirmed via name, medical record number and date of birth.  The sterile field was prepared with necessary supplies and equipment verified.  Insertion site was prepped with chlorhexidine and allowed to dry.  Maximum sterile techniques was utilized.    CVAD was inserted by Dr. Allena Katz.  Catheter was aspirated and flushed.      The Central Line Checklist was referenced.  CVAD Liaison was present during entire procedure.  Report of the procedure given to the Primary Nurse.    Procedure handed over to primary RN for swan cath insertion    Thank you for this consult,  Elizebeth Brooking, Juel Burrow, CVAD Liaison     Consult Time 75 minutes

## 2023-09-26 NOTE — Unmapped (Signed)
Nephrology Consult Note    Requesting Attending Physician :  Cyril Loosen, MD  Service Requesting Consult : Cardiology [facility])  Reason for Consult: concern for renal involvement of amyloid    Assessment and Plan:    # AKI on [name] 4   - Baseline EGFR ~15-20 (cr ~2.6), follows with [facility] Nephrology. [name] attributed to hypertension and diabetes. Recently with worsening renal functions attributed to cardiorenal syndrome.Has had similar admissions in 01/2023 with volume overload. Urine sediment remarkable for yeast, 50% dysmorphic RBCs per HPF and waxy casts  - Please obtain UA, Urine Na, UPCR and UACR   - Agree with diuresis. Defer titration to cards based on swan numbers.     # ATTR   #Acute on chronic heart failure   -Do not suspect renal involvement of ATTR as she has had no proteinuria. With amyloidosis, would have expected to see tubular proteinuria and pt has not had an elevated UPCR since 2022. Last MM work up back in 2021 was negative for IFE and SFLC were within normal limits. Repeating MM workup  - Evaluation and management per primary team  - No changes to management from a nephrology standpoint at this time    RECOMMENDATIONS:   - Serum creatinine trending down with increasing urine output.  - Continue current diuresis plan  - We will continue to follow.     Barnabas Harries, MD  09/26/2023 12:27 PM     Medical decision-making for 09/26/23  Findings / Data     Patient has: []  acute illness w/systemic sxs  [mod]  []  two or more stable chronic illnesses [mod]  []  one chronic illness with acute exacerbation [mod]  []  acute complicated illness  [mod]  []  Undiagnosed new problem with uncertain prognosis  [mod] [x]  illness posing risk to life or bodily function (ex. AKI)  [high]  []  chronic illness with severe exacerbation/progression  [high]  []  chronic illness with severe side effects of treatment  [high] AKI [name] 5 Probs At least 2:  Probs, Data, Risk   I reviewed: [x]  primary team note  [x]  consultant note(s)  [x]  external records [x]  chemistry results  [x]  CBC results  []  blood gas results  []  Other []  procedure/op note(s)   []  radiology report(s)  []  micro result(s)  []  w/ independent historian(s) Reviewed >=3 Data Review (2 of 3)    I independently interpreted: []  Urine Sediment  []  Renal US [x]  CXR Images  []  CT Images  []  Other []  EKG Tracing Reviewed  Any     I discussed: []  Pathology results w/ QHPs(s) from other specialties  []  Procedural findings w/ QHPs(s) from other specialties []  Imaging w/ QHP(s) from other specialties  [x]  Treatment plan w/ QHP(s) from other specialties Plan discussed with primary team Any     Mgm't requires: []  Prescription drug(s)  [mod]  []  Kidney biopsy  [mod]  []  Central line placement  [mod] []  High risk medication use and/or intensive toxicity monitoring [high]  []  Renal replacement therapy [high]  []  High risk kidney biopsy  [high]  []  Escalation of care  [high]  []  High risk central line placement  [high] [name]: check BMP/Mg  Risk      ____________________________________________________    Interval Hx: Reports feeling better this am. Serum creatining 4.4 mg/dl and 1.6X UOP in the past 24 hours    Physical Exam:   Vitals:    09/26/23 0830 09/26/23 0900 09/26/23 1000 09/26/23 1100   BP:  129/45 97/47 119/49   Pulse: 67 66 65 68   Resp: 14 14 15 15    Temp:       TempSrc:       SpO2: 95% 93% 94% 92%   Weight:         I/O this shift:  In: 93.4 [I.V.:93.4]  Out: 665 [Urine:665]    Intake/Output Summary (Last 24 hours) at 09/26/2023 1227  Last data filed at 09/26/2023 1000  Gross per 24 hour   Intake 779.03 ml   Output 2915 ml   Net -2135.97 ml     Constitutional: well-appearing, no acute distress  Heart: RRR  Lungs: CTAB  Abd: non-distended  Ext: 1+ edema

## 2023-09-26 NOTE — Unmapped (Signed)
Barbara Huber remains ICU status. Cardiac monitor has shown SR with 1st degree AV block and IVCD. T max 37.6 from PA catheter. Swan cable and [place] box changed at beginning of shift. CCO/Fick numbers and calculations done every 4 hours. SV02 re calibrated twice this shift. K+ replaced as ordered. CBG's monitored. Renal studies sent (blood and urine). Dopamine increased to 2 mcg/kg/min. No change in lasix gtt. Adequate urine output. Urine no longer bloody. She receives scheduled oxycodone for neuropathy pain and asked for tylenol once for right foot neuropathy pain. Please see epic charting for details of assessment, lab results and vital signs. Will continue to monitor and observe.      Problem: Adult Inpatient Plan of Care  Goal: Plan of Care Review  Outcome: Progressing  Goal: Patient-Specific Goal (Individualized)  Outcome: Progressing  Goal: Absence of Hospital-Acquired Illness or Injury  Outcome: Progressing  Intervention: Identify and Manage Fall Risk  Recent Flowsheet Documentation  Taken 09/25/2023 2000 by Salem Caster, RN  Safety Interventions:   aspiration precautions   bleeding precautions   environmental modification   fall reduction program maintained   family at bedside   infection management   lighting adjusted for tasks/safety   low bed   nonskid shoes/slippers when out of bed   bariatric safety  Intervention: Prevent Skin Injury  Recent Flowsheet Documentation  Taken 09/26/2023 0400 by Salem Caster, RN  Positioning for Skin: Supine/Back  Taken 09/26/2023 0200 by Salem Caster, RN  Positioning for Skin: Right  Taken 09/26/2023 0000 by Salem Caster, RN  Positioning for Skin: Supine/Back  Taken 09/25/2023 2200 by Salem Caster, RN  Positioning for Skin: Left  Taken 09/25/2023 2000 by Salem Caster, RN  Positioning for Skin: Supine/Back  Device Skin Pressure Protection:   absorbent pad utilized/changed   adhesive use limited   pressure points protected   tubing/devices free from skin contact  Skin Protection:   adhesive use limited   incontinence pads utilized   tubing/devices free from skin contact   silicone foam dressing in place  Intervention: Prevent and Manage VTE (Venous Thromboembolism) Risk  Recent Flowsheet Documentation  Taken 09/26/2023 0400 by Salem Caster, RN  Anti-Embolism Intervention: Off  Taken 09/26/2023 0200 by Salem Caster, RN  Anti-Embolism Intervention: Off  Taken 09/26/2023 0000 by Salem Caster, RN  Anti-Embolism Intervention: Off  Taken 09/25/2023 2200 by Salem Caster, RN  Anti-Embolism Intervention: Off  Taken 09/25/2023 2000 by Salem Caster, RN  VTE Prevention/Management:   ambulation promoted   anticoagulant therapy   bleeding precautions maintained  Anti-Embolism Intervention: Off  Intervention: Prevent Infection  Recent Flowsheet Documentation  Taken 09/25/2023 2000 by Salem Caster, RN  Infection Prevention:   cohorting utilized   environmental surveillance performed   equipment surfaces disinfected   hand hygiene promoted   personal protective equipment utilized   rest/sleep promoted   single patient room provided  Goal: Optimal Comfort and Wellbeing  Outcome: Progressing  Goal: Readiness for Transition of Care  Outcome: Progressing  Goal: Rounds/Family Conference  Outcome: Progressing     Problem: Fluid Volume Excess  Goal: Fluid Balance  Outcome: Progressing  Intervention: Monitor and Manage Hypervolemia  Recent Flowsheet Documentation  Taken 09/25/2023 2000 by Salem Caster, RN  Skin Protection:   adhesive use limited   incontinence pads utilized   tubing/devices free from skin contact   silicone foam dressing in place     Problem: Fall Injury Risk  Goal: Absence of Fall and Fall-Related Injury  Outcome: Progressing  Intervention: Promote Scientist, clinical (histocompatibility and immunogenetics) Documentation  Taken 09/25/2023 2000 by Salem Caster, RN  Safety Interventions:   aspiration precautions   bleeding precautions   environmental modification fall reduction program maintained   family at bedside   infection management   lighting adjusted for tasks/safety   low bed   nonskid shoes/slippers when out of bed   bariatric safety     Problem: Self-Care Deficit  Goal: Improved Ability to Complete Activities of Daily Living  Outcome: Progressing     Problem: Gas Exchange Impaired  Goal: Optimal Gas Exchange  Outcome: Progressing  Intervention: Optimize Oxygenation and Ventilation  Recent Flowsheet Documentation  Taken 09/26/2023 0400 by Salem Caster, RN  Head of Bed [facility]) Positioning: (flat for PA numbers)   HOB flat   HOB at 30-45 degrees  Taken 09/26/2023 0200 by Salem Caster, RN  Head of Bed [facility]) Positioning: HOB at 30-45 degrees  Taken 09/26/2023 0000 by Salem Caster, RN  Head of Bed [facility]) Positioning: (flat for PA numbers)   HOB flat   HOB at 30-45 degrees  Taken 09/25/2023 2200 by Salem Caster, RN  Head of Bed [facility]) Positioning: HOB at 30-45 degrees  Taken 09/25/2023 2100 by Salem Caster, RN  Head of Bed [facility]) Positioning: (flat for PA numbers)   HOB flat   HOB at 30-45 degrees  Taken 09/25/2023 2000 by Salem Caster, RN  Head of Bed [facility]) Positioning: HOB at 45 degrees     Problem: Noninvasive Ventilation Acute  Goal: Effective Unassisted Ventilation and Oxygenation  Outcome: Progressing     Problem: Heart Failure  Goal: Optimal Coping  Outcome: Progressing  Goal: Optimal Cardiac Output  Outcome: Progressing  Goal: Stable Heart Rate and Rhythm  Outcome: Progressing  Goal: Optimal Functional Ability  Outcome: Progressing  Goal: Fluid and Electrolyte Balance  Outcome: Progressing  Goal: Improved Oral Intake  Outcome: Progressing  Goal: Effective Oxygenation and Ventilation  Outcome: Progressing  Intervention: Optimize Oxygenation and Ventilation  Recent Flowsheet Documentation  Taken 09/26/2023 0400 by Salem Caster, RN  Head of Bed [facility]) Positioning: (flat for PA numbers)   HOB flat   HOB at 30-45 degrees  Taken 09/26/2023 0200 by Salem Caster, RN  Head of Bed [facility]) Positioning: HOB at 30-45 degrees  Taken 09/26/2023 0000 by Salem Caster, RN  Head of Bed [facility]) Positioning: (flat for PA numbers)   HOB flat   HOB at 30-45 degrees  Taken 09/25/2023 2200 by Salem Caster, RN  Head of Bed [facility]) Positioning: HOB at 30-45 degrees  Taken 09/25/2023 2100 by Salem Caster, RN  Head of Bed [facility]) Positioning: (flat for PA numbers)   HOB flat   HOB at 30-45 degrees  Taken 09/25/2023 2000 by Salem Caster, RN  Head of Bed [facility]) Positioning: HOB at 45 degrees  Goal: Effective Breathing Pattern During Sleep  Outcome: Progressing

## 2023-09-26 NOTE — Unmapped (Addendum)
Barbara Huber is a 82 y.o. female with pertinent PMH of [name], cardiac amyloidosis, pulmonary hypertension, chronic respiratory failure on 3 L, COPD, [name] stage IV, atrial fibrillation, DM II who to the ED with acute on chronic heart failure exacerbation now undergoing inotrope assisted diuresis.     Acute on Chronic [name] Exacerbation - TTR Cardiac Amyloidosis:   Patient follows with cardiology outpatient in the cardiac amyloid clinic and has been seen in pulm HTN clinic as well. Home GDMT includes Torsemide 60 mg daily and PRN metolazone. Jardiance stopped due to UTIs and she had issues with hypotension with ARB and BB. For her hATTR amyloid with neuropathy leading to falls, she was started Amvuttra 25mg  q16mos in April 2024. 2nd injection July 2024. She is on Tafamidis as well. Admitted from diuresis clinic.  Currently undergoing ongoing inotrope assisted diuresis with furosemide drip with robust urine output since starting dopamine. Patient became hypotensive on 9/30 after Dopamine was decreased to 1. Discontinued her Lasix drip and started her on Norepi. Palliative care was consulted as patient was struggling to grapple with progressive nature of her amyloidosis.     Hx of Persistent Atrial Fibrillation: CHA2DS2-VASc score = 5 (1-gender, 2-Age, 1-DM, 1-HTN). Is on chronic amiodarone 200 mg daily for atrial fibrillation. She is no longer on apixaban due to to bleeding episodes and anemia in the past. Patient continued on Amio 200 mg during her admission.     Chronic Kidney Disease stage V: Baseline Cr 2.5-3. Creatinine on admission was 5.34. Follows with nephrology in the outpatient setting.  Renal function is improved in recent days in the setting of ongoing diuresis.  In discussions with nephrology there is a low concern for any amyloid activity in her kidneys given absence of significant proteinuria.  Instead they believe this likely secondary to congestion.  It given improvement since initiation of diuresis we will continue to address this primarily with ongoing diuresis.

## 2023-09-27 LAB — BASIC METABOLIC PANEL
ANION GAP: 3 mmol/L — ABNORMAL LOW (ref 5–14)
ANION GAP: 7 mmol/L (ref 5–14)
BLOOD UREA NITROGEN: 91 mg/dL — ABNORMAL HIGH (ref 9–23)
BLOOD UREA NITROGEN: 86 mg/dL — ABNORMAL HIGH (ref 9–23)
BUN / CREAT RATIO: 27
BUN / CREAT RATIO: 32
CALCIUM: 9.5 mg/dL (ref 8.7–10.4)
CALCIUM: 8 mg/dL — ABNORMAL LOW (ref 8.7–10.4)
CHLORIDE: 102 mmol/L (ref 98–107)
CHLORIDE: 105 mmol/L (ref 98–107)
CO2: 29 mmol/L (ref 20.0–31.0)
CO2: 27 mmol/L (ref 20.0–31.0)
CREATININE: 3.31 mg/dL — ABNORMAL HIGH
CREATININE: 2.71 mg/dL — ABNORMAL HIGH
EGFR CKD-EPI (2021) FEMALE: 13 mL/min/{1.73_m2} — ABNORMAL LOW (ref >=60)
EGFR CKD-EPI (2021) FEMALE: 17 mL/min/{1.73_m2} — ABNORMAL LOW (ref >=60)
GLUCOSE RANDOM: 106 mg/dL (ref 70–179)
GLUCOSE RANDOM: 108 mg/dL (ref 70–179)
POTASSIUM: 3.7 mmol/L (ref 3.4–4.8)
POTASSIUM: 3.7 mmol/L (ref 3.4–4.8)
SODIUM: 134 mmol/L — ABNORMAL LOW (ref 135–145)
SODIUM: 139 mmol/L (ref 135–145)

## 2023-09-27 LAB — MONOCLONAL GAMMOPATHY WORKUP, URINE

## 2023-09-27 LAB — MONOCLONAL GAMMOPATHY WORKUP, SERUM
ALBUMIN (SPE): 3.1 g/dL — ABNORMAL LOW (ref 3.5–5.0)
ALPHA-1 GLOBULIN: 0.4 g/dL (ref 0.2–0.5)
ALPHA-2 GLOBULIN: 0.8 g/dL (ref 0.5–1.1)
BETA-1 GLOBULIN: 0.4 g/dL (ref 0.3–0.6)
BETA-2 GLOBULIN: 0.5 g/dL (ref 0.2–0.6)
GAMMAGLOBULIN: 1.1 g/dL (ref 0.5–1.5)
PROTEIN TOTAL (SPECIAL CHEM): 6.2 g/dL

## 2023-09-27 LAB — CBC
HEMATOCRIT: 36.5 % (ref 34.0–44.0)
HEMOGLOBIN: 12.2 g/dL (ref 11.3–14.9)
MEAN CORPUSCULAR HEMOGLOBIN CONC: 33.4 g/dL (ref 32.0–36.0)
MEAN CORPUSCULAR HEMOGLOBIN: 30.7 pg (ref 25.9–32.4)
MEAN CORPUSCULAR VOLUME: 91.7 fL (ref 77.6–95.7)
MEAN PLATELET VOLUME: 8.9 fL (ref 6.8–10.7)
PLATELET COUNT: 103 10*9/L — ABNORMAL LOW (ref 150–450)
RED BLOOD CELL COUNT: 3.98 10*12/L (ref 3.95–5.13)
RED CELL DISTRIBUTION WIDTH: 15 % (ref 12.2–15.2)
WBC ADJUSTED: 8.3 10*9/L (ref 3.6–11.2)

## 2023-09-27 LAB — SERUM FREE LIGHT CHAINS
K/L FLC RATIO: 1.74 — ABNORMAL HIGH (ref 0.26–1.65)
KAPPA FREE,SERUM: 7.04 mg/dL — ABNORMAL HIGH (ref 0.33–1.94)
LAMBDA FREE, SER: 4.04 mg/dL — ABNORMAL HIGH (ref 0.57–2.63)

## 2023-09-27 LAB — MAGNESIUM
MAGNESIUM: 2.2 mg/dL (ref 1.6–2.6)
MAGNESIUM: 1.9 mg/dL (ref 1.6–2.6)

## 2023-09-27 LAB — O2 SATURATION VENOUS: O2 SATURATION VENOUS: 68.6 % (ref 40.0–85.0)

## 2023-09-27 MED ADMIN — heparin (porcine) 5,000 unit/mL injection 5,000 Units: 5000 [IU] | SUBCUTANEOUS | @ 10:00:00

## 2023-09-27 MED ADMIN — vitamin A-3,000 mcg RAE (10,000 UNIT) capsule 3,000 mcg of RAE: 3000 ug | ORAL | @ 12:00:00

## 2023-09-27 MED ADMIN — NORepinephrine bitartrate-D5W 8 mg/250 mL (32 mcg/mL) infusion: INTRAVENOUS | @ 20:00:00 | Stop: 2023-09-27

## 2023-09-27 MED ADMIN — DOPamine 400 mg in dextrose 5% 250 mL (1600 mcg/mL) infusion PMB: 1 ug/kg/min | INTRAVENOUS | @ 07:00:00

## 2023-09-27 MED ADMIN — potassium chloride 20 mEq in 100 mL IVPB Premix: 20 meq | INTRAVENOUS | @ 09:00:00 | Stop: 2023-09-27

## 2023-09-27 MED ADMIN — gabapentin (NEURONTIN) capsule 100 mg: 100 mg | ORAL | @ 01:00:00

## 2023-09-27 MED ADMIN — oxyCODONE (OxyCONTIN) 12 hr crush resistant ER/CR tablet 10 mg: 10 mg | ORAL | @ 12:00:00 | Stop: 2023-10-08

## 2023-09-27 MED ADMIN — pantoprazole (Protonix) EC tablet 20 mg: 20 mg | ORAL | @ 12:00:00

## 2023-09-27 MED ADMIN — fluticasone furoate-vilanterol (BREO ELLIPTA) 100-25 mcg/dose inhaler 1 puff: 1 | RESPIRATORY_TRACT | @ 12:00:00

## 2023-09-27 MED ADMIN — NORepinephrine 8 mg in dextrose 5 % 250 mL (32 mcg/mL) infusion PMB: 0-30 ug/min | INTRAVENOUS | @ 20:00:00

## 2023-09-27 MED ADMIN — heparin (porcine) 5,000 unit/mL injection 5,000 Units: 5000 [IU] | SUBCUTANEOUS | @ 18:00:00

## 2023-09-27 MED ADMIN — gabapentin (NEURONTIN) capsule 100 mg: 100 mg | ORAL | @ 12:00:00

## 2023-09-27 MED ADMIN — tafamidis cap 61 mg **PATIENT-SUPPLIED**: 61 mg | ORAL | @ 12:00:00

## 2023-09-27 MED ADMIN — midodrine (PROAMATINE) tablet 5 mg: 5 mg | ORAL | @ 22:00:00

## 2023-09-27 MED ADMIN — magnesium sulfate 2gm/50mL IVPB: 2 g | INTRAVENOUS | @ 23:00:00 | Stop: 2023-09-27

## 2023-09-27 MED ADMIN — montelukast (SINGULAIR) tablet 10 mg: 10 mg | ORAL | @ 12:00:00

## 2023-09-27 MED ADMIN — potassium chloride ER tablet 40 mEq: 40 meq | ORAL | @ 23:00:00 | Stop: 2023-09-27

## 2023-09-27 MED ADMIN — senna (SENOKOT) tablet 1 tablet: 1 | ORAL | @ 01:00:00

## 2023-09-27 MED ADMIN — heparin (porcine) 5,000 unit/mL injection 5,000 Units: 5000 [IU] | SUBCUTANEOUS | @ 02:00:00

## 2023-09-27 MED ADMIN — polyethylene glycol (MIRALAX) packet 17 g: 17 g | ORAL | @ 01:00:00

## 2023-09-27 MED ADMIN — potassium chloride 20 mEq in 100 mL IVPB Premix: 20 meq | INTRAVENOUS | @ 10:00:00 | Stop: 2023-09-27

## 2023-09-27 MED ADMIN — polyethylene glycol (MIRALAX) packet 17 g: 17 g | ORAL | @ 12:00:00

## 2023-09-27 MED ADMIN — furosemide 200 mg in sodium chloride 0.9 % 100 mL (2 mg/mL) infusion: 20 mg/h | INTRAVENOUS | @ 15:00:00 | Stop: 2023-09-27

## 2023-09-27 MED ADMIN — oxyCODONE (OxyCONTIN) 12 hr crush resistant ER/CR tablet 10 mg: 10 mg | ORAL | @ 01:00:00 | Stop: 2023-10-08

## 2023-09-27 MED ADMIN — umeclidinium (INCRUSE ELLIPTA) 62.5 mcg/actuation inhaler 1 puff: 1 | RESPIRATORY_TRACT | @ 12:00:00

## 2023-09-27 MED ADMIN — amiodarone (PACERONE) tablet 200 mg: 200 mg | ORAL | @ 12:00:00

## 2023-09-27 MED ADMIN — furosemide 200 mg in sodium chloride 0.9 % 100 mL (2 mg/mL) infusion: 20 mg/h | INTRAVENOUS | @ 03:00:00

## 2023-09-27 NOTE — Unmapped (Signed)
Barbara Huber remains ICU status. Cardiac monitor has shown SR/ST with 1st degree AVB and IVCD. T max 38.1, On and off bed pan multiple times and up to bedside commode twice but no BM. Miralax and senna given as ordered. She gets tachypneic with activity and up to bedside commode. SV02's also decrease with activity. PA numbers, CCO/FICK calculations done every 4 hours. PA numbers remain elevated. Adequate urine output.Lasix remains at 20 mg/hr. Potassium replaced.  No changes in dopamine tonight. Please see EPIC charting for details of assessments, lab results and vital signs. Will continue to monitor and assess.      Problem: Adult Inpatient Plan of Care  Goal: Plan of Care Review  Outcome: Progressing  Goal: Patient-Specific Goal (Individualized)  Outcome: Progressing  Goal: Absence of Hospital-Acquired Illness or Injury  Outcome: Progressing  Intervention: Identify and Manage Fall Risk  Recent Flowsheet Documentation  Taken 09/26/2023 2000 by Salem Caster, RN  Safety Interventions:   aspiration precautions   lighting adjusted for tasks/safety   bariatric safety   bleeding precautions   commode/urinal/bedpan at bedside   environmental modification   fall reduction program maintained   family at bedside   infection management   low bed   nonskid shoes/slippers when out of bed  Intervention: Prevent Skin Injury  Recent Flowsheet Documentation  Taken 09/27/2023 0600 by Salem Caster, RN  Positioning for Skin: [facility]) Other (Comment)  Taken 09/27/2023 0400 by Salem Caster, RN  Positioning for Skin: Supine/Back  Taken 09/27/2023 0000 by Salem Caster, RN  Positioning for Skin: Left  Taken 09/26/2023 2200 by Salem Caster, RN  Positioning for Skin: Right  Taken 09/26/2023 2000 by Salem Caster, RN  Positioning for Skin: Supine/Back  Device Skin Pressure Protection:   absorbent pad utilized/changed   adhesive use limited   pressure points protected   tubing/devices free from skin contact  Skin Protection: adhesive use limited   incontinence pads utilized   protective footwear used   silicone foam dressing in place   tubing/devices free from skin contact  Intervention: Prevent and Manage VTE (Venous Thromboembolism) Risk  Recent Flowsheet Documentation  Taken 09/27/2023 0600 by Salem Caster, RN  Anti-Embolism Intervention: (see MAR) Other (Comment)  Taken 09/27/2023 0400 by Salem Caster, RN  Anti-Embolism Intervention: (see MAR) Other (Comment)  Taken 09/27/2023 0200 by Salem Caster, RN  Anti-Embolism Intervention: (see MAR) Other (Comment)  Taken 09/27/2023 0000 by Salem Caster, RN  Anti-Embolism Intervention: (see MAR) Other (Comment)  Taken 09/26/2023 2200 by Salem Caster, RN  Anti-Embolism Intervention: (see MAR) Other (Comment)  Taken 09/26/2023 2000 by Salem Caster, RN  VTE Prevention/Management: anticoagulant therapy  Anti-Embolism Intervention: (see MAR) Other (Comment)  Intervention: Prevent Infection  Recent Flowsheet Documentation  Taken 09/26/2023 2000 by Salem Caster, RN  Infection Prevention:   cohorting utilized   environmental surveillance performed   equipment surfaces disinfected   hand hygiene promoted   personal protective equipment utilized   rest/sleep promoted   single patient room provided  Goal: Optimal Comfort and Wellbeing  Outcome: Progressing  Goal: Readiness for Transition of Care  Outcome: Progressing  Goal: Rounds/Family Conference  Outcome: Progressing     Problem: Fluid Volume Excess  Goal: Fluid Balance  Outcome: Progressing  Intervention: Monitor and Manage Hypervolemia  Recent Flowsheet Documentation  Taken 09/26/2023 2000 by Salem Caster, RN  Skin Protection:   adhesive use limited   incontinence pads utilized  protective footwear used   silicone foam dressing in place   tubing/devices free from skin contact     Problem: Fall Injury Risk  Goal: Absence of Fall and Fall-Related Injury  Outcome: Progressing  Intervention: Promote Scientist, clinical (histocompatibility and immunogenetics) Documentation  Taken 09/26/2023 2000 by Salem Caster, RN  Safety Interventions:   aspiration precautions   lighting adjusted for tasks/safety   bariatric safety   bleeding precautions   commode/urinal/bedpan at bedside   environmental modification   fall reduction program maintained   family at bedside   infection management   low bed   nonskid shoes/slippers when out of bed     Problem: Self-Care Deficit  Goal: Improved Ability to Complete Activities of Daily Living  Outcome: Progressing     Problem: Gas Exchange Impaired  Goal: Optimal Gas Exchange  Outcome: Progressing  Intervention: Optimize Oxygenation and Ventilation  Recent Flowsheet Documentation  Taken 09/27/2023 0400 by Salem Caster, RN  Head of Bed [facility]) Positioning: (flat for PA numbers)   HOB flat   HOB at 30-45 degrees  Taken 09/27/2023 0000 by Salem Caster, RN  Head of Bed [facility]) Positioning: (flat for PA numbers)   HOB flat   HOB at 30-45 degrees  Taken 09/26/2023 2200 by Salem Caster, RN  Head of Bed [facility]) Positioning: HOB at 30-45 degrees  Taken 09/26/2023 2000 by Salem Caster, RN  Head of Bed (HOB) Positioning:   HOB flat   HOB at 30-45 degrees     Problem: Noninvasive Ventilation Acute  Goal: Effective Unassisted Ventilation and Oxygenation  Outcome: Progressing     Problem: Heart Failure  Goal: Optimal Coping  Outcome: Progressing  Goal: Optimal Cardiac Output  Outcome: Progressing  Goal: Stable Heart Rate and Rhythm  Outcome: Progressing  Goal: Optimal Functional Ability  Outcome: Progressing  Intervention: Optimize Functional Ability  Recent Flowsheet Documentation  Taken 09/27/2023 0000 by Salem Caster, RN  Activity Management: bedrest  Taken 09/26/2023 2200 by Salem Caster, RN  Activity Management: bedrest  Taken 09/26/2023 2000 by Salem Caster, RN  Activity Management: bedrest  Goal: Fluid and Electrolyte Balance  Outcome: Progressing  Goal: Improved Oral Intake  Outcome: Progressing  Goal: Effective Oxygenation and Ventilation  Outcome: Progressing  Intervention: Promote Airway Secretion Clearance  Recent Flowsheet Documentation  Taken 09/27/2023 0000 by Salem Caster, RN  Activity Management: bedrest  Taken 09/26/2023 2200 by Salem Caster, RN  Activity Management: bedrest  Taken 09/26/2023 2000 by Salem Caster, RN  Activity Management: bedrest  Intervention: Optimize Oxygenation and Ventilation  Recent Flowsheet Documentation  Taken 09/27/2023 0400 by Salem Caster, RN  Head of Bed [facility]) Positioning: (flat for PA numbers)   HOB flat   HOB at 30-45 degrees  Taken 09/27/2023 0000 by Salem Caster, RN  Head of Bed [facility]) Positioning: (flat for PA numbers)   HOB flat   HOB at 30-45 degrees  Taken 09/26/2023 2200 by Salem Caster, RN  Head of Bed [facility]) Positioning: HOB at 30-45 degrees  Taken 09/26/2023 2000 by Salem Caster, RN  Head of Bed [facility]) Positioning:   HOB flat   HOB at 30-45 degrees  Goal: Effective Breathing Pattern During Sleep  Outcome: Progressing     Problem: Skin Injury Risk Increased  Goal: Skin Health and Integrity  Outcome: Progressing  Intervention: Optimize Skin Protection  Recent Flowsheet Documentation  Taken 09/27/2023 0400 by Salem Caster, RN  Head  of Bed [facility]) Positioning: (flat for PA numbers)   HOB flat   HOB at 30-45 degrees  Taken 09/27/2023 0000 by Salem Caster, RN  Activity Management: bedrest  Head of Bed [facility]) Positioning: (flat for PA numbers)   HOB flat   HOB at 30-45 degrees  Taken 09/26/2023 2200 by Salem Caster, RN  Activity Management: bedrest  Head of Bed [facility]) Positioning: HOB at 30-45 degrees  Taken 09/26/2023 2000 by Salem Caster, RN  Activity Management: bedrest  Pressure Reduction Techniques:   frequent weight shift encouraged   pressure points protected  Head of Bed (HOB) Positioning:   HOB flat   HOB at 30-45 degrees  Pressure Reduction Devices:   pressure-redistributing mattress utilized   specialty bed utilized  Skin Protection:   adhesive use limited   incontinence pads utilized   protective footwear used   silicone foam dressing in place   tubing/devices free from skin contact

## 2023-09-27 NOTE — Unmapped (Signed)
PHYSICAL THERAPY  Evaluation (09/27/23 0810)          Patient Name:  Barbara Huber       Medical Record Number: [id number]   Date of Birth: 1941-01-04  Sex: Female        Post-Discharge Physical Therapy Recommendations:  PT Post Acute Discharge Recommendations: Skilled PT services indicated, 5x weekly, Low intensity   Equipment Recommendation  PT DME Recommendations: Defer to post acute             Treatment Diagnosis: impaired mobility, general deconditioning     Activity Tolerance: Tolerated treatment well, Limited by fatigue     ASSESSMENT  Problem List: Fall risk, Decreased mobility, Shortness of breath, Decreased endurance, Impaired ADLs, Gait deviation, Decreased strength      Assessment : Kala Hinzman is a 82 y.o. female with pertinent PMH of [name], cardiac amyloidosis, pulmonary hypertension, chronic respiratory failure on 3 L, COPD, [name] stage IV, atrial fibrillation, DM II who to the ED with acute on chronic heart failure exacerbation now undergoing inotrope assisted diuresis.     Patient presents to PT with impaired mobility, general deconditioning; impacting functional mobility and independence. She is motivated/receptive to therapy, family present/supportive throughout. Patient with adequate hemodynamic tolerance to activity, subjectively fatigues quickly and is well below her typical baseline. Recommended OT consult (MD aware) d/t functional status deficits as well as difficultly completing ADL's d/t UE tremors. Recommend 5x low post acute discharge at this time.     Based on evaluation findings associated with the patient's personal history factors, examination of body systems, and clinical presentation, the patient classifies as a moderate complexity level evaluation.      Today's Interventions: PT EVAL. Education role of PT, POC, benefits of mobilization, up with assistance, progressive mobility. Graded mobility to faciltiate independence, vitals monitored throughout. PLAN  Planned Frequency of Treatment: Plan of Care Initiated: 09/27/23  1-2x per day for: 4-5x week  Planned Treatment Duration: 10/04/23     Planned Interventions: Education (Patient/Family/Caregiver), Self-care / Home Management training, Gait training, Therapeutic Exercise, Therapeutic Activity, Home exercise program, Neuromuscular re-education     Goals:   Patient and Family Goals: recover/get stronger and go home     SHORT GOAL #1: patient will be able to perform supine/sit with stand by assist, HOB flat               Time Frame : 1 week  SHORT GOAL #2: patient will be able to perform OOB transfers with mod independence, LRAD              Time Frame : 1 week  SHORT GOAL #3: patient will be able to ambulate >105ft with stand by assist, LRAD              Time Frame : 1 week                                           Long Term Goal #1: Patient will improve AMPAC to 21/24 in 2 weeks  Time Frame: 2 weeks     Prognosis:  Good  Positive Indicators: motivated, accessible home, social support  Barriers to Discharge: Endurance deficits, Functional strength deficits, Impaired Balance, Gait instability, Severity of deficits     SUBJECTIVE  Communication Preference: Verbal,     Patient reports: patient agreeable to PT; reprots being weaker than typical  Prior Functional Status: patient reports modified independence at home using rollator. Able to complete most ADL's without assistance. 1 fall in Feb with pelvic fracture (non-op management) and no residual deficits/pain. Wears 3L O2 consistently at home  Equipment available at home: Rollator, Oxygen        Past Medical History:   Diagnosis Date    Acute kidney injury superimposed on chronic kidney disease (CMS-HCC) 10/11/2020    Acute on chronic diastolic (congestive) heart failure (CMS-HCC) 08/23/2020    Acute on chronic diastolic congestive heart failure (CMS-HCC)     AKI (acute kidney injury) (CMS-HCC) 04/14/2015    Lab Results  Component  Value  Date     CREATININE 1.90 (H)  06/12/2021     Had a bump in her creatinine when she was taking her diuretics every day.  She is currently taking 40 mg daily of torsemide and 50 mg of spironolactone.  Her volume status is fragile.  Previously when she stopped her diuretic she becomes short of breath.  Plan: We will check her BMP today.  We will likely have to go to 40    Anemia 08/22/2019    Iron deficiency anemia          Lab Results      Component    Value    Date           WBC    9.2    03/21/2021           RBC    3.67 (L)    03/21/2021           HGB    9.9 (L)    03/21/2021           HCT    30.7 (L)    03/21/2021           MCV    83.7    03/21/2021           MCH    26.9    03/21/2021           MCHC    32.1    03/21/2021           RDW    21.9 (H)    03/21/2021           PLT    318     Arthritis     Calculus of kidney     Calculus of ureter     CHF (congestive heart failure) (CMS-HCC)     Chronic atrial fibrillation (CMS-HCC) 07/20/2019    COPD (chronic obstructive pulmonary disease) (CMS-HCC)     Diabetes (CMS-HCC)     Gangrenous cholecystitis 10/11/2020    Generalized edema  06/17/2021    GERD (gastroesophageal reflux disease)     Hydronephrosis     Hypertension     Hyponatremia 10/11/2020    Intermediate coronary syndrome (CMS-HCC) 03/13/2014    Lower extremity edema 09/28/2020    Lumbar stenosis     Microscopic hematuria     Nausea alone     Nephrolithiasis 04/17/2016    Neuropathy     Nocturia     Nocturia 07/01/2017    Other chronic cystitis     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  See plans for anemia, AKI  We are also tapering her gabapentin.  Currently on 300 mg 3 times daily.  We will decrease it to twice daily, and then nightly.  She states she is  not having any recurrence of her pain.    Pulmonary hypertension (CMS-HCC)     Renal colic     Shortness of breath 04/28/2020    Sleep apnea     Unstable angina pectoris (CMS-HCC) 03/13/2014            Social History     Tobacco Use Smoking status: Former     Current packs/day: 0.00     Types: Cigarettes     Quit date: 2019     Years since quitting: 5.7    Smokeless tobacco: Never    Tobacco comments:     Quit a few years ago   Substance Use Topics    Alcohol use: No       Past Surgical History:   Procedure Laterality Date    BACK SURGERY  1995    CARPAL TUNNEL RELEASE Left 2014    HYSTERECTOMY  1971    IR INSERT CHOLECYSTOSMY TUBE PERCUTANEOUS  10/02/2020    IR INSERT CHOLECYSTOSMY TUBE PERCUTANEOUS 10/02/2020 Braulio Conte, MD IMG VIR [facility]    LUMBAR DISC SURGERY      PR REMOVAL GALLBLADDER N/A 10/06/2020    Procedure: CHOLECYSTECTOMY;  Surgeon: Katherina Mires, MD;  Location: MAIN OR [facility];  Service: Trauma    PR RIGHT HEART CATH O2 SATURATION & CARDIAC OUTPUT N/A 09/30/2020    Procedure: Right Heart Catheterization;  Surgeon: Neal Dy, MD;  Location: [facility] CATH;  Service: Cardiology    PR RIGHT HEART CATH O2 SATURATION & CARDIAC OUTPUT N/A 01/09/2021    Procedure: Right Heart Catheterization;  Surgeon: Lesle Reek, MD;  Location: [facility] CATH;  Service: Cardiology             Family History   Problem Relation Age of Onset    Hypertension Mother     Cancer Father         COLON CANCER    No Known Problems Sister     No Known Problems Sister     No Known Problems Brother     No Known Problems Brother     No Known Problems Maternal Aunt     No Known Problems Maternal Uncle     No Known Problems Paternal Aunt     No Known Problems Paternal Uncle     No Known Problems Maternal Grandmother     No Known Problems Maternal Grandfather     No Known Problems Paternal Grandmother     No Known Problems Paternal Grandfather     No Known Problems Other     Anesthesia problems Neg Hx     Broken bones Neg Hx     Clotting disorder Neg Hx     Collagen disease Neg Hx     Diabetes Neg Hx     Dislocations Neg Hx     Fibromyalgia Neg Hx     Gout Neg Hx     Hemophilia Neg Hx     Osteoporosis Neg Hx     Rheumatologic disease Neg Hx Scoliosis Neg Hx     Severe sprains Neg Hx     Sickle cell anemia Neg Hx     Spinal Compression Fracture Neg Hx     GU problems Neg Hx     Kidney cancer Neg Hx     Prostate cancer Neg Hx         Allergies: Nitrofurantoin and Lipitor [atorvastatin]  Objective Findings  Precautions / Restrictions  Precautions: Falls precautions  Weight Bearing Status: Non-applicable  Required Braces or Orthoses: Non-applicable     Pain Comments: patient reports general body soreness, does not rate. activity adjusted to patient tolerance  Medical Tests / Procedures: Reviewed patient chart prior to session  Equipment / Environment: Vascular access (PIV, TLC, Port-a-cath, PICC), Supplemental oxygen, Telemetry, Foley, [place] (2 mcg dopamine)     Vitals/Orthostatics : At Rest: HR 100 afib, SpO2 97%, BP 106/53 semi-fowler position. w/activity: HR 108 afib, SpO2 >92%, MAP >70     Living Situation  Living Environment: House  Lives With: Daughter, Family (daughter + son-in-law)  Home Living: One level home, Walk-in shower, Handicapped height toilet, Grab bars in shower, Built-in shower seat, Shower chair with back, Ramped entrance  Caregiver Identified?: Yes  Caregiver Availability: 24 hours  Caregiver Ability: Limited lifting      Cognition: Follows 1-step commands  Cognition comment: alert and oriented, pleasant and cooperative  Visual/Perception: Wears Glasses/Contacts  Hearing: No deficit identified     Skin Inspection: Intact where visualized     Upper Extremities  UE ROM: Right WFL, Left WFL  UE Strength: Right Impaired/Limited, Left Impaired/Limited  RUE Strength Impairment: Reduced strength  LUE Strength Impairment: Reduced strength  UE comment: generalized weakness, noted to have bilateral tremors with ADL activities (intermittent/chronic at home when her kidney function worsens per family report)    Lower Extremities  LE Strength: Right Impaired/Limited, Left Impaired/Limited  RLE Strength Impairment: Reduced strength  LLE Strength Impairment: Reduced strength  LE comment: grossly 2-3/5, unable to perform SLR bilaterally          Coordination: Not tested  Proprioception: Not tested  Sensation: Impaired (bilateral LE/UE neuropathy per patient report)  Posture: WFL    Sitting Balance comments: fair: independent sitting EOB in static position. stand by assist with dynamic ADL's    Standing Balance comments: poor: requires AD support for OOB activity      Bed Mobility comments: supine>sit with min assist, elevated HOB. cues for sequence. sit>supine w/mod assist for LE management, HOB flat. rolling R/L with min assist, HOB flat. cues for sequence.     Transfer comments: sit/stand to/from low bed with contact guard assist, patient with preference to perform transfers while holding onto rollator (baseline technique per patient). poor eccentric control during stand>sit. slow power/force development noted during sit>stand.      Gait Level of Assistance: Minimal assist, patient does 75% or more  Gait Assistive Device: Four wheel walker (rollator with seat removed)  Gait Distance Ambulated (ft): 3 ft  Skilled Treatment Performed: lateral side steps to the R, poor foot clearance bilaterally. easily fatigues with heavy reliance on rollator.     Stairs: not assessed      Wheelchair Mobility: not assessed     Endurance: poor: limited at baseline, easily fatigues with activities today    Patient at end of session: All needs in reach, In bed, Lines intact, Nurse notified, Friends/Family present [employer] [place])    Physical Therapy Session Duration  PT Individual [mins]: 38          AM-PAC-6 click  Help currently need turning over In bed?: A Little - Minimal/Contact Guard Assist/Supervision  Help currently needed sitting down/standing up from chair with arms? : A Little - Minimal/Contact Guard Assist/Supervision  Help currently needed moving from supine to sitting on edge of bed?: A lot - Maximum/Moderate Assistance  Help currently needed moving to and from bed  from wheelchair?: A Little - Minimal/Contact Guard Assist/Supervision  Help currently needed walking in a hospital room?: A lot - Maximum/Moderate Assistance  Help currently needed climbing 3-5 steps with railing?: Unable to do/total assistance - Total Dependent Assist    Basic Mobility Score 6 click: 14    6 click Score (in points): % of Functional Impairment, Limitation, Restriction  6: 100% impaired, limited, restricted  7-8: At least 80%, but less than 100% impaired, limited restricted  9-13: At least 60%, but less than 80% impaired, limited restricted  14-19: At least 40%, but less than 60% impaired, limited restricted  20-22: At least 20%, but less than 40% impaired, limited restricted  23: At least 1%, but less than 20% impaired, limited restricted  24: 0% impaired, limited restricted        I attest that I have reviewed the above information.  Signed: Patrica Duel, PT  [facility] 09/27/2023

## 2023-09-27 NOTE — Unmapped (Addendum)
Palliative Care Progress Note    Consultation from Requesting Attending Physician:  Barbara Peon, MD  Service Requesting Consult:  Heart Failure (MDD)  Reason for Consult Request from Attending Physician:  Evaluation of Goals of Care / Decision Making and Patient and Family Support  Primary Care Provider:  Artelia Laroche, MD          Assessment/Plan:      SUMMARY:  This 82 y.o. patient is seriously and acutely ill due to acute on chronic [name], amyloid myopathy,complicated by co-morbid acute and chronic conditions including pulmonary hypertension, chronic respiratory failure on 3 L, COPD, [name] stage IV, atrial fibrillation,  DM II, precipitous functional decline, and frequent hospital readmissions.       -- Barbara Huber is seen in consultation at the request of primary team for introduction to palliative care and initiation of goals of care discussions.   -- Quality of life and level of independence has been relatively stable leading up to this admission. Current goals are to reach a point of hospital discharge to home with PT/OT. Barbara Huber will likely benefit from ongoing palliative care support and goals of care discussion. Referral to  [facility] Outpatient non-oncology Palliative Care has been placed.     As goals are clear and symptoms well controlled, inpatient palliative care will sign off at this time. We are happy to re-engage if anything changes. Communicated with primary team on updates and recommendations.       Symptom Assessment and Recommendations:        Chronic Back Pain: Has been followed by outpatient pain clinic [facility] in [place] over the past 4-5 years. Takes OxyContin 10 mg twice daily + scheduled bowel regimen.    #Dyspnea in the setting of COPD, HF/amyloid myopathy  Symptoms stable on 3-4L O2 nasal canula, consistent with her home regimen    #Progressive weakness over the past 6 months in the setting of advanced age + multiple chronic illness  - Ambulates with minimal assist  - Recommend continued PT/OT        Goals of Care and Decision Making Assessment and Recommendations:       Prognosis / prognostic understanding:  Not specifically discussed. Need additional input from cardiology.  In general, daughter shares knowledge that Barbara Huber is living with multiple life-limiting chronic illnesses  Decisional capacity at time of visit:  Full  Healthcare Decision Maker if lacks capacity:    HCDM (patient stated preference): Barbara Huber,Barbara Huber - Daughter - [phone]  Advance Directive: no  Code status:   Code Status: Full Code       Current Goals of care:    -- Reach a point of hospital discharge to home with PT/OT support  -- Current goals are to continue life-extending care. However, Barbara Huber and her daughter recognize prognosis is uncertain and  goals  may shift over time as disease progresses.        Practical, Emotional, Spiritual Support Recommendations:  -- Introduced role of palliative care and symptom management, decision making, psychosocial support, and goals of care  -- Recommend referral to outpatient palliative care. Daughter prefers home based support through [facility], but since this is not available she is amenable to outpatient support at the [facility].    Today Barbara Huber and her daughter share understanding that she is very seriously ill due to complications related to progressive amyloid and heart failure. They know the cardiology team is concerned that her body will continue to get more  sick given the progressive nature of the disease, overall weakness and fatigue, and her advanced age.   Barbara Huber and her daughter are hopeful she will reach a point of hospital discharge to home. We discussed the cardiology team will continue to support her goals, AND their recommendation for palliative care as an extra laye of support once she leaves the hospital.   Barbara Huber and her daughter are amenable to extra support. They are currently focused on the here and now, but recognizes goals may change if she continues to experience medical setbacks.    Thank you for this consult. Please page Barbette Merino, Juel Burrow (pager: [phone]) or Palliative Care [phone]) if there are any questions.       Subjective:     [id number] [name] is a 82 y.o. female with pertinent PMH of [name], cardiac amyloidosis, pulmonary hypertension, chronic respiratory failure on 3 L, COPD, [name] stage IV, atrial fibrillation, DM II who to the ED with acute on chronic heart failure exacerbation now undergoing inotrope assisted diuresis.      Symptom Severity, Assessment and Current Medication / Treatment:     Pain:  Chronic pain (as noted above).  Pain stable and overall well controlled.   Shortness of breath:  Stable dyspnea on exertion.   Nausea:  Denies  Constipation: Multiple loose stools today. Not watery  Sleep:poor quality sleep due to routine ICU interruptions  Anxiety/Depression:  well supported by family  Appetite:  Overall stable, but slight decline over the past 6 months      Psychosocial situation and relevant past history (medical, family, social):  - Lives with Daughter and son-in-law in [place]  - Currently managing her own ADL's at home  - Enjoys watching her stories and cooking shows, looking at [employer], and working on her word searches.  - Feels well supported by friends and family      Allergies:  Allergies   Allergen Reactions    Nitrofurantoin Anaphylaxis and Other (See Comments)    Lipitor [Atorvastatin] Muscle Pain     SOB, Headache, fatigue, sick on my stomach       Medications:  Scheduled Meds:   amiodarone  200 mg Oral Daily    flu vacc [id number](65yr up)-PF  0.5 mL Intramuscular During hospitalization    fluticasone furoate-vilanterol  1 puff Inhalation Daily (RT)    gabapentin  100 mg Oral BID    heparin (porcine) for subcutaneous use  5,000 Units Subcutaneous Q8H SCH    montelukast  10 mg Oral Daily    oxyCODONE  10 mg Oral Q12H SCH    pantoprazole  20 mg Oral Daily    polyethylene glycol  17 g Oral BID    senna  1 tablet Oral Nightly    tafamidis  61 mg Oral Daily    umeclidinium  1 puff Inhalation Daily (RT)    vitamin A-3,000 mcg RAE (10,000 UNIT)  3,000 mcg of RAE Oral Daily     Continuous Infusions:   DOPamine 1 mcg/kg/min (09/27/23 1200)    furosemide 20 mg/hr (09/27/23 1200)    sodium chloride 10 mL/hr (09/27/23 1200)     PRN Meds:.acetaminophen, albuterol, sodium chloride     Past Medical History:   Diagnosis Date    Acute kidney injury superimposed on chronic kidney disease (CMS-HCC) 10/11/2020    Acute on chronic diastolic (congestive) heart failure (CMS-HCC) 08/23/2020    Acute on chronic diastolic congestive heart failure (CMS-HCC)     AKI (acute kidney injury) (CMS-HCC)  04/14/2015    Lab Results  Component  Value  Date     CREATININE  1.90 (H)  06/12/2021     Had a bump in her creatinine when she was taking her diuretics every day.  She is currently taking 40 mg daily of torsemide and 50 mg of spironolactone.  Her volume status is fragile.  Previously when she stopped her diuretic she becomes short of breath.  Plan: We will check her BMP today.  We will likely have to go to 40    Anemia 08/22/2019    Iron deficiency anemia          Lab Results      Component    Value    Date           WBC    9.2    03/21/2021           RBC    3.67 (L)    03/21/2021           HGB    9.9 (L)    03/21/2021           HCT    30.7 (L)    03/21/2021           MCV    83.7    03/21/2021           MCH    26.9    03/21/2021           MCHC    32.1    03/21/2021           RDW    21.9 (H)    03/21/2021           PLT    318     Arthritis     Calculus of kidney     Calculus of ureter     CHF (congestive heart failure) (CMS-HCC)     Chronic atrial fibrillation (CMS-HCC) 07/20/2019    COPD (chronic obstructive pulmonary disease) (CMS-HCC)     Diabetes (CMS-HCC)     Gangrenous cholecystitis 10/11/2020    Generalized edema  06/17/2021    GERD (gastroesophageal reflux disease)     Hydronephrosis     Hypertension Hyponatremia 10/11/2020    Intermediate coronary syndrome (CMS-HCC) 03/13/2014    Lower extremity edema 09/28/2020    Lumbar stenosis     Microscopic hematuria     Nausea alone     Nephrolithiasis 04/17/2016    Neuropathy     Nocturia     Nocturia 07/01/2017    Other chronic cystitis     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  See plans for anemia, AKI  We are also tapering her gabapentin.  Currently on 300 mg 3 times daily.  We will decrease it to twice daily, and then nightly.  She states she is not having any recurrence of her pain.    Pulmonary hypertension (CMS-HCC)     Renal colic     Shortness of breath 04/28/2020    Sleep apnea     Unstable angina pectoris (CMS-HCC) 03/13/2014       Past Surgical History:   Procedure Laterality Date    BACK SURGERY  1995    CARPAL TUNNEL RELEASE Left 2014    HYSTERECTOMY  1971    IR INSERT CHOLECYSTOSMY TUBE PERCUTANEOUS  10/02/2020    IR INSERT CHOLECYSTOSMY TUBE PERCUTANEOUS 10/02/2020 Braulio Conte, MD IMG VIR [facility]  LUMBAR DISC SURGERY      PR REMOVAL GALLBLADDER N/A 10/06/2020    Procedure: CHOLECYSTECTOMY;  Surgeon: Katherina Mires, MD;  Location: MAIN OR [facility];  Service: Trauma    PR RIGHT HEART CATH O2 SATURATION & CARDIAC OUTPUT N/A 09/30/2020    Procedure: Right Heart Catheterization;  Surgeon: Neal Dy, MD;  Location: [facility] CATH;  Service: Cardiology    PR RIGHT HEART CATH O2 SATURATION & CARDIAC OUTPUT N/A 01/09/2021    Procedure: Right Heart Catheterization;  Surgeon: Lesle Reek, MD;  Location: [facility] CATH;  Service: Cardiology         Objective:       Function:  40% - Ambulation: Mainly bed / Unable to do any work, extensive disease / Self-Care:M [employer] / Intake: Normal or reduced / Level of Conscious: Full , drowsy, or confusion    Temp:  [37.2 ??C (99 ??F)-38.1 ??C (100.6 ??F)] 37.2 ??C (99 ??F)  Heart Rate:  [58-101] 83  SpO2 Pulse:  [88-93] 88  Resp:  [8-38] 22  BP: (81-135)/(34-90) 105/51  SpO2:  [89 %-99 %] 94 %    Physical Exam:  Constitutional: Pleasant elderly female. Obese. Sitting up in chair.  No acute distress.  Eyes: anicteric sclera  ENMT: moist oral mucosa  Pulm: No increased work of breathing or use of accessory muscles.  CV: Regular rate and rhythm, per monitors  Barbara: Ambulates to commode with one-person assist  Skin: Warm/dry.  Neuro: cognitive status:  A/O x 3  muscle strength: strong grip bilaterally  Psych: Mood appropriate; no evidence of disordered thinking. Calm. Attentive    Test Results:  Lab Results   Component Value Date    WBC 8.3 09/27/2023    RBC 3.98 09/27/2023    HGB 12.2 09/27/2023    HCT 36.5 09/27/2023    MCV 91.7 09/27/2023    MCH 30.7 09/27/2023    MCHC 33.4 09/27/2023    RDW 15.0 09/27/2023    PLT 103 (L) 09/27/2023    MPV 8.9 09/27/2023     Lab Results   Component Value Date    NA 134 (L) 09/27/2023    K 3.7 09/27/2023    CL 102 09/27/2023    CO2 29.0 09/27/2023    BUN 91 (H) 09/27/2023    CREATININE 3.31 (H) 09/27/2023    GFR >= 60 01/06/2011    GLU 106 09/27/2023    CALCIUM 9.5 09/27/2023    ALBUMIN 3.8 09/24/2023    PHOS 4.5 08/31/2023      Lab Results   Component Value Date    ALKPHOS 154 (H) 09/24/2023    BILITOT 0.5 09/24/2023    BILIDIR 0.30 08/21/2023    PROT 6.2 09/25/2023    ALBUMIN 3.8 09/24/2023    ALT 12 09/24/2023    AST 24 09/24/2023       Imaging: reviewed in Epic      I personally spent 45 minutes face-to-face and non-face-to-face in the care of this patient, which includes all pre, intra, and post visit time on the date of service.  All documented time was specific to the E/M visit and does not include any procedures that may have been performed.     See ACP Note from today for additional billable service:  No.      Barbette Merino, Palliative Care AGNP

## 2023-09-27 NOTE — Unmapped (Signed)
Nephrology Consult Note    Requesting Attending Physician :  Denny Peon, MD  Service Requesting Consult : Heart Failure (MDD)  Reason for Consult: concern for renal involvement of amyloid    Assessment and Plan:    # AKI on [name] 4   - Baseline EGFR ~15-20 (cr ~2.6), follows with [facility]. [name] attributed to hypertension and diabetes. Recently with worsening renal functions attributed to cardiorenal syndrome.Has had similar admissions in 01/2023 with volume overload. Urine sediment remarkable for yeast, 50% dysmorphic RBCs per HPF and waxy casts. Currently consistent with cardiorenal given improvement with decongestion  - Cr down to 3.31 (from 3.7)  - Agree with diuresis. Defer titration to cards based on swan numbers. May be helpful to obtain PCWP for avoiding overdiuresis    # ATTR   #Acute on chronic heart failure   -Do not suspect renal involvement of ATTR as she has had no proteinuria. With amyloidosis, would have expected to see tubular proteinuria and pt has not had an elevated UPCR since 2022. Last MM work up back in 2021 was negative for IFE and SFLC were within normal limits. Repeating MM workup  - Evaluation and management per primary team  - No changes to management from a nephrology standpoint at this time    RECOMMENDATIONS:   - Serum creatinine trending down with increasing urine output.  - Continue current diuresis plan -- May be helpful to obtain PCWP for avoiding overdiuresis  - We will continue to follow.     Clyde Lundborg, MD  09/27/2023 4:22 PM     Medical decision-making for 09/27/23  Findings / Data     Patient has: []  acute illness w/systemic sxs  [mod]  []  two or more stable chronic illnesses [mod]  []  one chronic illness with acute exacerbation [mod]  []  acute complicated illness  [mod]  []  Undiagnosed new problem with uncertain prognosis  [mod] [x]  illness posing risk to life or bodily function (ex. AKI)  [high]  []  chronic illness with severe exacerbation/progression  [high]  []  chronic illness with severe side effects of treatment  [high] AKI [name] 5 Probs At least 2:  Probs, Data, Risk   I reviewed: [x]  primary team note  [x]  consultant note(s)  [x]  external records [x]  chemistry results  [x]  CBC results  []  blood gas results  []  Other []  procedure/op note(s)   []  radiology report(s)  []  micro result(s)  []  w/ independent historian(s) Cr 3.3; hgb 12.2 >=3 Data Review (2 of 3)    I independently interpreted: []  Urine Sediment  []  Renal US []  CXR Images  []  CT Images  []  Other []  EKG Tracing  Any     I discussed: []  Pathology results w/ QHPs(s) from other specialties  []  Procedural findings w/ QHPs(s) from other specialties []  Imaging w/ QHP(s) from other specialties  [x]  Treatment plan w/ QHP(s) from other specialties Plan discussed with primary team Any     Mgm't requires: []  Prescription drug(s)  [mod]  []  Kidney biopsy  [mod]  []  Central line placement  [mod] []  High risk medication use and/or intensive toxicity monitoring [high]  []  Renal replacement therapy [high]  []  High risk kidney biopsy  [high]  []  Escalation of care  [high]  []  High risk central line placement  [high] [name]: check BMP/Mg  Risk      ____________________________________________________    Interval Hx: Reports feeling better this am.Creatinine downtrending; net neg 1.3L with 3L UOP  Physical Exam:   Vitals:    09/27/23 1300 09/27/23 1400 09/27/23 1500 09/27/23 1502   BP: 105/51 97/47 73/45  91/45   Pulse: 83 87  88   Resp: 22 19 14 17    Temp:       TempSrc:       SpO2: 94% 96%  96%   Weight:         I/O this shift:  In: 758.6 [P.O.:600; I.V.:158.6]  Out: 695 [Urine:695]    Intake/Output Summary (Last 24 hours) at 09/27/2023 1622  Last data filed at 09/27/2023 1200  Gross per 24 hour   Intake 1733.04 ml   Output 2420 ml   Net -686.96 ml     Constitutional: well-appearing, no acute distress  Heart: RRR  Lungs: CTAB  Abd: non-distended  Ext: trace+ edema

## 2023-09-27 NOTE — Unmapped (Signed)
Patient is using NPPV for short periods of time.

## 2023-09-27 NOTE — Unmapped (Deleted)
PHYSICAL THERAPY  Evaluation (09/27/23 0810)          Patient Name:  Barbara Huber       Medical Record Number: [id number]   Date of Birth: Aug 25, 1941  Sex: Female        Post-Discharge Physical Therapy Recommendations:  PT Post Acute Discharge Recommendations: Skilled PT services indicated, 5x weekly, Low intensity   Equipment Recommendation  PT DME Recommendations: Defer to post acute             Treatment Diagnosis: impaired mobility, general deconditioning     Activity Tolerance: Tolerated treatment well, Limited by fatigue     ASSESSMENT  Problem List: Fall risk, Decreased mobility, Shortness of breath, Decreased endurance, Impaired ADLs, Gait deviation, Decreased strength      Assessment : Barbara Huber is a 82 y.o. female with pertinent PMH of [name], cardiac amyloidosis, pulmonary hypertension, chronic respiratory failure on 3 L, COPD, [name] stage IV, atrial fibrillation, DM II who to the ED with acute on chronic heart failure exacerbation now undergoing inotrope assisted diuresis.     Patient presents to PT with impaired mobility, general deconditioning; impacting functional mobility and independence. She is motivated/receptive to therapy, family present/supportive throughout. Patient with adequate hemodynamic tolerance to activity, subjectively fatigues quickly and is well below her typical baseline. Recommended OT consult (MD aware) d/t functional status deficits as well as difficultly completing ADL's d/t UE tremors. Recommend 5x low post acute discharge at this time.     Based on evaluation findings associated with the patient's personal history factors, examination of body systems, and clinical presentation, the patient classifies as a moderate complexity level evaluation.      Today's Interventions: PT EVAL. Education role of PT, POC, benefits of mobilization, up with assistance, progressive mobility. Graded mobility to faciltiate independence, vitals monitored throughout. PLAN  Planned Frequency of Treatment: Plan of Care Initiated: 09/27/23  1-2x per day for: 4-5x week  Planned Treatment Duration: 10/04/23     Planned Interventions: Education (Patient/Family/Caregiver), Self-care / Home Management training, Gait training, Therapeutic Exercise, Therapeutic Activity, Home exercise program, Neuromuscular re-education     Goals:   Patient and Family Goals: recover/get stronger and go home     SHORT GOAL #1: patient will be able to perform supine/sit with stand by assist, HOB flat               Time Frame : 1 week  SHORT GOAL #2: patient will be able to perform OOB transfers with mod independence, LRAD              Time Frame : 1 week  SHORT GOAL #3: patient will be able to ambulate >109ft with stand by assist, LRAD              Time Frame : 1 week                                           Long Term Goal #1: Patient will improve AMPAC to 21/24 in 2 weeks  Time Frame: 2 weeks     Prognosis:  Good  Positive Indicators: motivated, accessible home, social support  Barriers to Discharge: Endurance deficits, Functional strength deficits, Impaired Balance, Gait instability, Severity of deficits     SUBJECTIVE  Communication Preference: Verbal,     Patient reports: patient agreeable to PT; reprots being weaker than typical  Prior Functional Status: patient reports modified independence at home using rollator. Able to complete most ADL's without assistance. 1 fall in Feb with pelvic fracture (non-op management) and no residual deficits/pain. Wears 3L O2 consistently at home  Equipment available at home: Rollator, Oxygen        Past Medical History:   Diagnosis Date    Acute kidney injury superimposed on chronic kidney disease (CMS-HCC) 10/11/2020    Acute on chronic diastolic (congestive) heart failure (CMS-HCC) 08/23/2020    Acute on chronic diastolic congestive heart failure (CMS-HCC)     AKI (acute kidney injury) (CMS-HCC) 04/14/2015    Lab Results  Component  Value  Date     CREATININE 1.90 (H)  06/12/2021     Had a bump in her creatinine when she was taking her diuretics every day.  She is currently taking 40 mg daily of torsemide and 50 mg of spironolactone.  Her volume status is fragile.  Previously when she stopped her diuretic she becomes short of breath.  Plan: We will check her BMP today.  We will likely have to go to 40    Anemia 08/22/2019    Iron deficiency anemia          Lab Results      Component    Value    Date           WBC    9.2    03/21/2021           RBC    3.67 (L)    03/21/2021           HGB    9.9 (L)    03/21/2021           HCT    30.7 (L)    03/21/2021           MCV    83.7    03/21/2021           MCH    26.9    03/21/2021           MCHC    32.1    03/21/2021           RDW    21.9 (H)    03/21/2021           PLT    318     Arthritis     Calculus of kidney     Calculus of ureter     CHF (congestive heart failure) (CMS-HCC)     Chronic atrial fibrillation (CMS-HCC) 07/20/2019    COPD (chronic obstructive pulmonary disease) (CMS-HCC)     Diabetes (CMS-HCC)     Gangrenous cholecystitis 10/11/2020    Generalized edema  06/17/2021    GERD (gastroesophageal reflux disease)     Hydronephrosis     Hypertension     Hyponatremia 10/11/2020    Intermediate coronary syndrome (CMS-HCC) 03/13/2014    Lower extremity edema 09/28/2020    Lumbar stenosis     Microscopic hematuria     Nausea alone     Nephrolithiasis 04/17/2016    Neuropathy     Nocturia     Nocturia 07/01/2017    Other chronic cystitis     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  See plans for anemia, AKI  We are also tapering her gabapentin.  Currently on 300 mg 3 times daily.  We will decrease it to twice daily, and then nightly.  She states she is  not having any recurrence of her pain.    Pulmonary hypertension (CMS-HCC)     Renal colic     Shortness of breath 04/28/2020    Sleep apnea     Unstable angina pectoris (CMS-HCC) 03/13/2014            Social History     Tobacco Use Smoking status: Former     Current packs/day: 0.00     Types: Cigarettes     Quit date: 2019     Years since quitting: 5.7    Smokeless tobacco: Never    Tobacco comments:     Quit a few years ago   Substance Use Topics    Alcohol use: No       Past Surgical History:   Procedure Laterality Date    BACK SURGERY  1995    CARPAL TUNNEL RELEASE Left 2014    HYSTERECTOMY  1971    IR INSERT CHOLECYSTOSMY TUBE PERCUTANEOUS  10/02/2020    IR INSERT CHOLECYSTOSMY TUBE PERCUTANEOUS 10/02/2020 Braulio Conte, MD IMG VIR [facility]    LUMBAR DISC SURGERY      PR REMOVAL GALLBLADDER N/A 10/06/2020    Procedure: CHOLECYSTECTOMY;  Surgeon: Katherina Mires, MD;  Location: MAIN OR [facility];  Service: Trauma    PR RIGHT HEART CATH O2 SATURATION & CARDIAC OUTPUT N/A 09/30/2020    Procedure: Right Heart Catheterization;  Surgeon: Neal Dy, MD;  Location: [facility] CATH;  Service: Cardiology    PR RIGHT HEART CATH O2 SATURATION & CARDIAC OUTPUT N/A 01/09/2021    Procedure: Right Heart Catheterization;  Surgeon: Lesle Reek, MD;  Location: [facility] CATH;  Service: Cardiology             Family History   Problem Relation Age of Onset    Hypertension Mother     Cancer Father         COLON CANCER    No Known Problems Sister     No Known Problems Sister     No Known Problems Brother     No Known Problems Brother     No Known Problems Maternal Aunt     No Known Problems Maternal Uncle     No Known Problems Paternal Aunt     No Known Problems Paternal Uncle     No Known Problems Maternal Grandmother     No Known Problems Maternal Grandfather     No Known Problems Paternal Grandmother     No Known Problems Paternal Grandfather     No Known Problems Other     Anesthesia problems Neg Hx     Broken bones Neg Hx     Clotting disorder Neg Hx     Collagen disease Neg Hx     Diabetes Neg Hx     Dislocations Neg Hx     Fibromyalgia Neg Hx     Gout Neg Hx     Hemophilia Neg Hx     Osteoporosis Neg Hx     Rheumatologic disease Neg Hx Scoliosis Neg Hx     Severe sprains Neg Hx     Sickle cell anemia Neg Hx     Spinal Compression Fracture Neg Hx     GU problems Neg Hx     Kidney cancer Neg Hx     Prostate cancer Neg Hx         Allergies: Nitrofurantoin and Lipitor [atorvastatin]  Objective Findings  Precautions / Restrictions  Precautions: Falls precautions  Weight Bearing Status: Non-applicable  Required Braces or Orthoses: Non-applicable     Pain Comments: patient reports general body soreness, does not rate. activity adjusted to patient tolerance  Medical Tests / Procedures: Reviewed patient chart prior to session  Equipment / Environment: Vascular access (PIV, TLC, Port-a-cath, PICC), Supplemental oxygen, Telemetry, Foley, Swan Ganz (2 mcg dopamine)     Vitals/Orthostatics : At Rest: HR 100 afib, SpO2 97%, BP 106/53 semi-fowler position. w/activity: HR 108 afib, SpO2 >92%, MAP >70     Living Situation  Living Environment: House  Lives With: Daughter, Family (daughter + son-in-law)  Home Living: One level home, Walk-in shower, Handicapped height toilet, Grab bars in shower, Built-in shower seat, Shower chair with back, Ramped entrance  Caregiver Identified?: Yes  Caregiver Availability: 24 hours  Caregiver Ability: Limited lifting      Cognition: Follows 1-step commands  Cognition comment: alert and oriented, pleasant and cooperative  Visual/Perception: Wears Glasses/Contacts  Hearing: No deficit identified     Skin Inspection: Intact where visualized     Upper Extremities  UE ROM: Right WFL, Left WFL  UE Strength: Right Impaired/Limited, Left Impaired/Limited  RUE Strength Impairment: Reduced strength  LUE Strength Impairment: Reduced strength  UE comment: generalized weakness, noted to have bilateral tremors with ADL activities (intermittent/chronic at home when her kidney function worsens per family report)    Lower Extremities  LE Strength: Right Impaired/Limited, Left Impaired/Limited  RLE Strength Impairment: Reduced strength  LLE Strength Impairment: Reduced strength  LE comment: grossly 2-3/5, unable to perform SLR bilaterally          Coordination: Not tested  Proprioception: Not tested  Sensation: Impaired (bilateral LE/UE neuropathy per patient report)  Posture: WFL    Sitting Balance comments: fair: independent sitting EOB in static position. stand by assist with dynamic ADL's    Standing Balance comments: poor: requires AD support for OOB activity      Bed Mobility comments: supine>sit with min assist, elevated HOB. cues for sequence. sit>supine w/mod assist for LE management, HOB flat. rolling R/L with min assist, HOB flat. cues for sequence.     Transfer comments: sit/stand to/from low bed with contact guard assist, patient with preference to perform transfers while holding onto rollator (baseline technique per patient). poor eccentric control during stand>sit. slow power/force development noted during sit>stand.      Gait Level of Assistance: Minimal assist, patient does 75% or more  Gait Assistive Device: Four wheel walker (rollator with seat removed)  Gait Distance Ambulated (ft): 3 ft  Skilled Treatment Performed: lateral side steps to the R, poor foot clearance bilaterally. easily fatigues with heavy reliance on rollator.     Stairs: not assessed      Wheelchair Mobility: not assessed     Endurance: poor: limited at baseline, easily fatigues with activities today         Physical Therapy Session Duration  PT Individual [mins]: 38          AM-PAC-6 click  Help currently need turning over In bed?: A Little - Minimal/Contact Guard Assist/Supervision  Help currently needed sitting down/standing up from chair with arms? : A Little - Minimal/Contact Guard Assist/Supervision  Help currently needed moving from supine to sitting on edge of bed?: A lot - Maximum/Moderate Assistance  Help currently needed moving to and from bed from wheelchair?: A Little - Minimal/Contact Guard Assist/Supervision  Help currently needed walking in a hospital room?:  A lot - Maximum/Moderate Assistance  Help currently needed climbing 3-5 steps with railing?: Unable to do/total assistance - Total Dependent Assist    Basic Mobility Score 6 click: 14    6 click Score (in points): % of Functional Impairment, Limitation, Restriction  6: 100% impaired, limited, restricted  7-8: At least 80%, but less than 100% impaired, limited restricted  9-13: At least 60%, but less than 80% impaired, limited restricted  14-19: At least 40%, but less than 60% impaired, limited restricted  20-22: At least 20%, but less than 40% impaired, limited restricted  23: At least 1%, but less than 20% impaired, limited restricted  24: 0% impaired, limited restricted        I attest that I have reviewed the above information.  Signed: Patrica Duel, PT  [facility] 09/27/2023

## 2023-09-27 NOTE — Unmapped (Signed)
Patient refused NPPV.

## 2023-09-27 NOTE — Unmapped (Signed)
CICU Progress Note    Hospital Day: 4    Hospital Course:       9/27: Patient admitted for acute on chronic heart failure  9/28: Patient transferred to CICU in the setting of need for increased hemodynamic with PA catheter monitoring on inotrope assisted diuresis. Net -2 L on lasix drip and dopamine infusion.   9/29: Diuresis continued. No significant changes.   9/30: Wean dopamine from 2 to 1 with goal of coming off of dopamine.     Subjective / Interval History:       Barbara Huber continues to endorse shortness of breath. Her Barbara Huber - previously placed on 9/28. Patient's blood pressures have improved. Will plan to remove patient's swan ganz catheter and replace it with central lines today to monitor CVPs. She continues to be on Lasix drip today. She responded well to diuresis yesterday.     Engaged in conversation about advanced care directives considering progressive nature of her cardiac amyloidosis. Will plan to consult palliative care to help with this conversation.      Assessment/Plan:        Principal Problem:    Acute exacerbation of CHF (congestive heart failure) (CMS-HCC)  Active Problems:    COPD (chronic obstructive pulmonary disease) (CMS-HCC)    Essential hypertension    Type 2 diabetes mellitus with stage 4 chronic kidney disease, with long-term current use of insulin (CMS-HCC)    Chronic respiratory failure with hypoxia (CMS-HCC)    Persistent atrial fibrillation (CMS-HCC)    Chronic kidney disease ([name]), stage IV (severe) (CMS-HCC)    Chronic prescription opiate use    Familial amyloid heart disease (CMS-HCC)      Barbara Huber is a 82 y.o. female with pertinent PMH of [name], cardiac amyloidosis, pulmonary hypertension, chronic respiratory failure on 3 L, COPD, [name] stage IV, atrial fibrillation, DM II who to the ED with acute on chronic heart failure exacerbation underwent inotrope assisted diuresis.    Neurological   Chronic Pain: Followed by the outpatient pain clinic [facility] in [place]. Takes OxyContin 12 mg 2 times a day, can continue current regimen inpatient with bowel regimen ordered.     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 Pt presented with O2 sats in the 80s requiring BiPap in the ED. Patient was able to be weaned from 5 to 4 L on 9/30.   - Wean oxygen as tolerated  - Breo Ellipta inhaler 1 puff daily  - Increased Ellipta inhaler 1 puff daily  - continue home Singulair 10mg  daily   - PRN albuterol    Cardiovascular   Acute on Chronic [name] Exacerbation - TTR Cardiac Amyloidosis:   Patient follows with cardiology outpatient in the cardiac amyloid clinic and has been seen in pulm HTN clinic as well. Home GDMT includes Torsemide 60 mg daily and PRN metolazone. Jardiance stopped due to UTIs and she had issues with hypotension with ARB and BB. For her hATTR amyloid with neuropathy leading to falls, she was started Amvuttra 25mg  q1mos in April 2024. 2nd injection July 2024. She is on Tafamidis as well. Admitted from diuresis clinic.  Currently undergoing ongoing inotrope assisted diuresis with furosemide drip with robust urine output since starting dopamine. Heart rates within normal  limits at this time so we will continue on dopamine and Lasix drip. Patient responded well to the Lasix drop over the last 24 hr 3L of output for a NN 1.3 L. Will plan to continue her on the lasix drip considering her good response with improving Cr. Plan to try to wean patient off dopamine over the course of the day.  - Furosemide gtt. 20 mg/hr  - Decreased Dopamine to 1 mcg/KG/min  - Strict Is/Os; daily weights  - Continue home tafamidis 61 mg daily; holding Amvuttra inpatient  - Vitamin A-3000 mcg RAE daily in setting of Amvuttra prescription    Hx of Persistent Atrial Fibrillation: CHA2DS2-VASc score = 5 (1-gender, 2-Age, 1-DM, 1-HTN). Is on chronic amiodarone 200 mg daily for atrial fibrillation. She is no longer on apixaban due to to bleeding episodes and anemia in the past.  - Continue Amiodarone 200 mg daily     HLD: Holding home Crestor as not available and patient had myopathy due to atorvastatin     Renal   Chronic Kidney Disease stage V: Baseline Cr 2.5-3. Creatinine on admission was 5.34. Follows with nephrology in the outpatient setting.  Renal function is improved in recent days in the setting of ongoing diuresis.  In discussions with nephrology there is a low concern for any amyloid activity in her kidneys given absence of significant proteinuria.  Instead they believe this is likely secondary to congestion. Given improvement since initiation of diuresis we will continue to address this primarily with ongoing diuresis. Patient Cr continues to improve.   - Diuresis as above  - Nephrology consulted, appreciate assistance  - Avoid hypotension and nephrotoxins  - Renally dose all meds    Infectious Disease   NAI    FEN/GI   GERD: continue home protonix 20 mg daily       Heme/Coag   NAI    Endocrine   DM II: Not currently on treatment. A1C 5.8 on 08/30/23     Prophylaxis   ? VTE: Heparin SQ  ? GI: Not indicated      Code Status: Full Code    Dispo: Pending Clinical Course      Objective:      Vitals - past 24 hours  Temp:  [37.2 ??C (99 ??F)-38.1 ??C (100.6 ??F)] 37.2 ??C (99 ??F)  Heart Rate:  [58-101] 87  SpO2 Pulse:  [93] 93  Resp:  [8-38] 16  BP: (81-135)/(34-90) 92/46  SpO2:  [89 %-99 %] 96 % Intake/Output  I/O last 3 completed shifts:  In: 2276.2 [P.O.:1245; I.V.:936.2; IV Piggyback:95]  Out: 4550 [Urine:4550]     Physical Exam:    General: well-appearing, no acute distress  HEENT: PERRL, OP clear without lesions  CV: RRR, no m/r/g  Lungs: Faint crackles in bilateral lower bases  Abd: soft, non-tender, non-distended, +bs  Skin: no visible lesions or rashes  Neuro: Occasional jerking motions of hands.  Otherwise neurologically within normal limits.      Continuous Infusions:    DOPamine 1 mcg/kg/min (09/27/23 1200)    furosemide 20 mg/hr (09/27/23 1200)    sodium chloride 10 mL/hr (09/27/23 1200)       Vent settings for last 24 hours: N/A       Tubes and Drains:  Patient Lines/Drains/Airways Status       Active Active Lines, Drains, & Airways       Name Placement date Placement time Site Days    Introducer 09/25/23 Internal jugular Right 09/25/23  1754  Internal jugular  1    PA Catheter 09/25/23 Internal jugular Right 09/25/23  1800  Internal jugular  1    Urethral Catheter 16 Fr. 09/24/23  1818  --  2    Peripheral IV 09/24/23 Left Antecubital 09/24/23  1324  Antecubital  2    Peripheral IV 09/25/23 Anterior;Distal;Left Forearm 09/25/23  0644  Forearm  2                    Data Review:   Recent Labs     09/26/23  0530 09/27/23  0356   WBC 7.1 8.3   HGB 11.2* 12.2   HCT 34.5 36.5   PLT 119* 103*     Recent Labs     09/26/23  1517 09/27/23  0356   NA 134* 134*   K 4.0 3.7   CL 103 102   CO2 29.0 29.0   BUN 100* 91*   CREATININE 3.77* 3.31*   GLU 107 106   MG 2.2 2.2      Recent Labs     09/24/23  1325 09/25/23  2344   BILITOT 0.5  --    PROT 7.3 6.2   ALBUMIN 3.8  --    ALT 12  --    AST 24  --    ALKPHOS 154*  --       Recent Labs     09/24/23  1325   INR 1.04         Placido Sou, MD  Anesthesiology, PGY-1

## 2023-09-27 NOTE — Unmapped (Signed)
Barbara Huber is in the CICU with a R internal Jugular introducer with KVO and a PA catheter.  Her PA values are high at her baseline and her CVP has been in the teens all shift with UO well wnl to foley secondary to lasix gtt to PIV.  Dopamine continues at the ordered dose to the infusion port of the PA catheter.  RN requested an increased frequency for miralax.  Pt c/o needing or feeling as if she needs to stool but she just passes gas.  Her daughter orders her food and helps her eat while she sits on the side of the bed.  She had a low SVO2 with some activity in bed today times one but that has not been seen with activity later in the shift.  Pt denies SOB or orthopnea with being flat.  Barbara Huber turns well with some help and she frequently declines turns.  She will remove the pillows to turn herself in the bed.  During the night shift, Barbara Huber was a heavy oob assist to bsc and scale per report so she has remained in bed this shift but she has sat on the side of the bed twice to eat without issue.  RN has provided support and answered questions.  See flow sheet for details.  Will follow.    Problem: Adult Inpatient Plan of Care  Goal: Plan of Care Review  Outcome: Progressing  Goal: Patient-Specific Goal (Individualized)  Outcome: Progressing  Goal: Absence of Hospital-Acquired Illness or Injury  Outcome: Progressing  Intervention: Identify and Manage Fall Risk  Recent Flowsheet Documentation  Taken 09/26/2023 0800 by Erma Heritage, RN  Safety Interventions:   bleeding precautions   fall reduction program maintained   family at bedside   low bed   nonskid shoes/slippers when out of bed  Intervention: Prevent Skin Injury  Recent Flowsheet Documentation  Taken 09/26/2023 1600 by Erma Heritage, RN  Positioning for Skin: Left  Taken 09/26/2023 1400 by Erma Heritage, RN  Positioning for Skin: Supine/Back  Taken 09/26/2023 1200 by Erma Heritage, RN  Positioning for Skin: Right  Taken 09/26/2023 1000 by Erma Heritage, RN  Positioning for Skin: Left  Taken 09/26/2023 0800 by Erma Heritage, RN  Positioning for Skin: Right  Skin Protection: silicone foam dressing in place  Goal: Optimal Comfort and Wellbeing  Outcome: Progressing  Goal: Readiness for Transition of Care  Outcome: Progressing  Goal: Rounds/Family Conference  Outcome: Progressing     Problem: Fluid Volume Excess  Goal: Fluid Balance  Outcome: Progressing  Intervention: Monitor and Manage Hypervolemia  Recent Flowsheet Documentation  Taken 09/26/2023 0800 by Erma Heritage, RN  Skin Protection: silicone foam dressing in place     Problem: Fall Injury Risk  Goal: Absence of Fall and Fall-Related Injury  Outcome: Progressing  Intervention: Promote Scientist, clinical (histocompatibility and immunogenetics) Documentation  Taken 09/26/2023 0800 by Erma Heritage, RN  Safety Interventions:   bleeding precautions   fall reduction program maintained   family at bedside   low bed   nonskid shoes/slippers when out of bed     Problem: Gas Exchange Impaired  Goal: Optimal Gas Exchange  Outcome: Progressing  Intervention: Optimize Oxygenation and Ventilation  Recent Flowsheet Documentation  Taken 09/26/2023 1600 by Erma Heritage, RN  Head of Bed [facility]) Positioning: HOB at 30-45 degrees  Taken 09/26/2023 1400 by Erma Heritage, RN  Head of Bed [facility]) Positioning: HOB at 30-45 degrees  Taken 09/26/2023 1200 by Erma Heritage, RN  Head of Bed [facility]) Positioning: HOB at 30-45 degrees  Taken 09/26/2023 1000 by Mahmud Keithly, Earley Abide, RN  Head of Bed [facility]) Positioning: HOB at 30-45 degrees  Taken 09/26/2023 0800 by Nile Dorning, Earley Abide, RN  Head of Bed [facility]) Positioning: HOB at 30-45 degrees     Problem: Noninvasive Ventilation Acute  Goal: Effective Unassisted Ventilation and Oxygenation  Outcome: Progressing     Problem: Heart Failure  Goal: Optimal Coping  Outcome: Progressing  Goal: Optimal Cardiac Output  Outcome: Progressing  Goal: Stable Heart Rate and Rhythm  Outcome: Progressing  Goal: Optimal Functional Ability  Outcome: Progressing  Intervention: Optimize Functional Ability  Recent Flowsheet Documentation  Taken 09/26/2023 1600 by Erma Heritage, RN  Activity Management: bedrest  Taken 09/26/2023 1400 by Erma Heritage, RN  Activity Management: bedrest  Taken 09/26/2023 1200 by Erma Heritage, RN  Activity Management: bedrest  Taken 09/26/2023 1000 by Erma Heritage, RN  Activity Management: bedrest  Taken 09/26/2023 0800 by Erma Heritage, RN  Activity Management: bedrest  Goal: Fluid and Electrolyte Balance  Outcome: Progressing  Goal: Improved Oral Intake  Outcome: Progressing  Goal: Effective Oxygenation and Ventilation  Outcome: Progressing  Intervention: Promote Airway Secretion Clearance  Recent Flowsheet Documentation  Taken 09/26/2023 1600 by Erma Heritage, RN  Activity Management: bedrest  Taken 09/26/2023 1400 by Erma Heritage, RN  Activity Management: bedrest  Taken 09/26/2023 1200 by Erma Heritage, RN  Activity Management: bedrest  Taken 09/26/2023 1000 by Erma Heritage, RN  Activity Management: bedrest  Taken 09/26/2023 0800 by Erma Heritage, RN  Activity Management: bedrest  Intervention: Optimize Oxygenation and Ventilation  Recent Flowsheet Documentation  Taken 09/26/2023 1600 by Erma Heritage, RN  Head of Bed [facility]) Positioning: HOB at 30-45 degrees  Taken 09/26/2023 1400 by Erma Heritage, RN  Head of Bed [facility]) Positioning: HOB at 30-45 degrees  Taken 09/26/2023 1200 by Erma Heritage, RN  Head of Bed [facility]) Positioning: HOB at 30-45 degrees  Taken 09/26/2023 1000 by Erma Heritage, RN  Head of Bed [facility]) Positioning: HOB at 30-45 degrees  Taken 09/26/2023 0800 by Erma Heritage, RN  Head of Bed (HOB) Positioning: HOB at 30-45 degrees  Goal: Effective Breathing Pattern During Sleep  Outcome: Progressing     Problem: Skin Injury Risk Increased  Goal: Skin Health and Integrity  Outcome: Progressing  Intervention: Optimize Skin Protection  Recent Flowsheet Documentation  Taken 09/26/2023 1600 by Erma Heritage, RN  Activity Management: bedrest  Head of Bed [facility]) Positioning: HOB at 30-45 degrees  Taken 09/26/2023 1400 by Erma Heritage, RN  Activity Management: bedrest  Head of Bed [facility]) Positioning: HOB at 30-45 degrees  Taken 09/26/2023 1200 by Erma Heritage, RN  Activity Management: bedrest  Head of Bed [facility]) Positioning: HOB at 30-45 degrees  Taken 09/26/2023 1000 by Erma Heritage, RN  Activity Management: bedrest  Head of Bed [facility]) Positioning: HOB at 30-45 degrees  Taken 09/26/2023 0800 by Erma Heritage, RN  Activity Management: bedrest  Head of Bed (HOB) Positioning: HOB at 30-45 degrees  Skin Protection: silicone foam dressing in place

## 2023-09-28 DIAGNOSIS — I43 Cardiomyopathy in diseases classified elsewhere: Principal | ICD-10-CM

## 2023-09-28 DIAGNOSIS — I5032 Chronic diastolic (congestive) heart failure: Principal | ICD-10-CM

## 2023-09-28 DIAGNOSIS — E852 Heredofamilial amyloidosis, unspecified: Principal | ICD-10-CM

## 2023-09-28 DIAGNOSIS — I5023 Acute on chronic systolic (congestive) heart failure: Principal | ICD-10-CM

## 2023-09-28 DIAGNOSIS — N184 Chronic kidney disease, stage 4 (severe): Principal | ICD-10-CM

## 2023-09-28 DIAGNOSIS — E854 Organ-limited amyloidosis: Principal | ICD-10-CM

## 2023-09-28 DIAGNOSIS — Z515 Encounter for palliative care: Principal | ICD-10-CM

## 2023-09-28 LAB — BASIC METABOLIC PANEL
ANION GAP: 6 mmol/L (ref 5–14)
ANION GAP: 8 mmol/L (ref 5–14)
BLOOD UREA NITROGEN: 87 mg/dL — ABNORMAL HIGH (ref 9–23)
BLOOD UREA NITROGEN: 90 mg/dL — ABNORMAL HIGH (ref 9–23)
BUN / CREAT RATIO: 35
BUN / CREAT RATIO: 40
CALCIUM: 9 mg/dL (ref 8.7–10.4)
CALCIUM: 9.6 mg/dL (ref 8.7–10.4)
CHLORIDE: 103 mmol/L (ref 98–107)
CHLORIDE: 97 mmol/L — ABNORMAL LOW (ref 98–107)
CO2: 29 mmol/L (ref 20.0–31.0)
CO2: 28 mmol/L (ref 20.0–31.0)
CREATININE: 2.47 mg/dL — ABNORMAL HIGH
CREATININE: 2.26 mg/dL — ABNORMAL HIGH
EGFR CKD-EPI (2021) FEMALE: 19 mL/min/{1.73_m2} — ABNORMAL LOW (ref >=60)
EGFR CKD-EPI (2021) FEMALE: 21 mL/min/{1.73_m2} — ABNORMAL LOW (ref >=60)
GLUCOSE RANDOM: 124 mg/dL (ref 70–179)
GLUCOSE RANDOM: 220 mg/dL — ABNORMAL HIGH (ref 70–179)
POTASSIUM: 4.2 mmol/L (ref 3.4–4.8)
POTASSIUM: 4.2 mmol/L (ref 3.4–4.8)
SODIUM: 138 mmol/L (ref 135–145)
SODIUM: 133 mmol/L — ABNORMAL LOW (ref 135–145)

## 2023-09-28 LAB — CBC
HEMATOCRIT: 36.8 % (ref 34.0–44.0)
HEMOGLOBIN: 12.7 g/dL (ref 11.3–14.9)
MEAN CORPUSCULAR HEMOGLOBIN CONC: 34.6 g/dL (ref 32.0–36.0)
MEAN CORPUSCULAR HEMOGLOBIN: 31.9 pg (ref 25.9–32.4)
MEAN CORPUSCULAR VOLUME: 92.1 fL (ref 77.6–95.7)
MEAN PLATELET VOLUME: 9.2 fL (ref 6.8–10.7)
PLATELET COUNT: 85 10*9/L — ABNORMAL LOW (ref 150–450)
RED BLOOD CELL COUNT: 3.99 10*12/L (ref 3.95–5.13)
RED CELL DISTRIBUTION WIDTH: 14.9 % (ref 12.2–15.2)
WBC ADJUSTED: 9.9 10*9/L (ref 3.6–11.2)

## 2023-09-28 LAB — O2 SATURATION VENOUS: O2 SATURATION VENOUS: 68.8 % (ref 40.0–85.0)

## 2023-09-28 LAB — MAGNESIUM
MAGNESIUM: 2.5 mg/dL (ref 1.6–2.6)
MAGNESIUM: 2.6 mg/dL (ref 1.6–2.6)

## 2023-09-28 MED ADMIN — polyethylene glycol (MIRALAX) packet 17 g: 17 g | ORAL | @ 13:00:00

## 2023-09-28 MED ADMIN — tafamidis cap 61 mg **PATIENT-SUPPLIED**: 61 mg | ORAL | @ 13:00:00

## 2023-09-28 MED ADMIN — amiodarone (PACERONE) tablet 200 mg: 200 mg | ORAL | @ 13:00:00

## 2023-09-28 MED ADMIN — midodrine (PROAMATINE) tablet 5 mg: 5 mg | ORAL | @ 13:00:00

## 2023-09-28 MED ADMIN — umeclidinium (INCRUSE ELLIPTA) 62.5 mcg/actuation inhaler 1 puff: 1 | RESPIRATORY_TRACT | @ 14:00:00

## 2023-09-28 MED ADMIN — senna (SENOKOT) tablet 1 tablet: 1 | ORAL | @ 01:00:00

## 2023-09-28 MED ADMIN — gabapentin (NEURONTIN) capsule 100 mg: 100 mg | ORAL | @ 01:00:00

## 2023-09-28 MED ADMIN — midodrine (PROAMATINE) tablet 5 mg: 5 mg | ORAL | @ 22:00:00

## 2023-09-28 MED ADMIN — heparin (porcine) 5,000 unit/mL injection 5,000 Units: 5000 [IU] | SUBCUTANEOUS | @ 03:00:00

## 2023-09-28 MED ADMIN — montelukast (SINGULAIR) tablet 10 mg: 10 mg | ORAL | @ 13:00:00

## 2023-09-28 MED ADMIN — apixaban (ELIQUIS) tablet 2.5 mg: 2.5 mg | ORAL | @ 17:00:00

## 2023-09-28 MED ADMIN — fluticasone furoate-vilanterol (BREO ELLIPTA) 100-25 mcg/dose inhaler 1 puff: 1 | RESPIRATORY_TRACT | @ 14:00:00

## 2023-09-28 MED ADMIN — midodrine (PROAMATINE) tablet 5 mg: 5 mg | ORAL | @ 17:00:00

## 2023-09-28 MED ADMIN — heparin (porcine) 5,000 unit/mL injection 5,000 Units: 5000 [IU] | SUBCUTANEOUS | @ 10:00:00 | Stop: 2023-09-28

## 2023-09-28 MED ADMIN — furosemide (LASIX) 120 mg in sodium chloride (NS) 0.9 % 50 mL IVPB: 120 mg | INTRAVENOUS | @ 18:00:00 | Stop: 2023-09-28

## 2023-09-28 MED ADMIN — vitamin A-3,000 mcg RAE (10,000 UNIT) capsule 3,000 mcg of RAE: 3000 ug | ORAL | @ 13:00:00

## 2023-09-28 MED ADMIN — oxyCODONE (OxyCONTIN) 12 hr crush resistant ER/CR tablet 10 mg: 10 mg | ORAL | @ 01:00:00 | Stop: 2023-10-08

## 2023-09-28 MED ADMIN — pantoprazole (Protonix) EC tablet 20 mg: 20 mg | ORAL | @ 13:00:00

## 2023-09-28 MED ADMIN — polyethylene glycol (MIRALAX) packet 17 g: 17 g | ORAL | @ 01:00:00

## 2023-09-28 MED ADMIN — gabapentin (NEURONTIN) capsule 100 mg: 100 mg | ORAL | @ 13:00:00

## 2023-09-28 NOTE — Unmapped (Signed)
OCCUPATIONAL THERAPY  Evaluation (09/28/23 0834)    Patient Name:  Barbara Huber       Medical Record Number: [id number]     Date of Birth: 14-Nov-1941  Sex: Female      Post-Discharge Occupational Therapy Recommendations: 5x weekly, Low intensity   Equipment Recommendation  OT DME Recommendations: Defer to post acute       OT Treatment Diagnosis: Pt presented with decreased activity tolerance, decreased safety awareness, strength deficits, balance deficits, UE tremors, and impaired judgement which impacted her ability to perform ADLs and functional t/fs safely and independently.    Assessment  Problem List: Decreased activity tolerance, Decreased endurance, Fall risk, Decreased strength, Decreased mobility, Impaired ADLs, Gait deviation, Decreased safety awareness, Impaired judgement, Impaired balance, Impaired sensation  Personal Factors/Comorbidities (Occupational Profile and History Review): Extensive (High)    Assessment: Barbara Huber is a 82 y.o. female with pertinent PMH of [name], cardiac amyloidosis, pulmonary hypertension, chronic respiratory failure on 3 L, COPD, [name] stage IV, atrial fibrillation, DM II who to the ED with acute on chronic heart failure exacerbation now undergoing inotrope assisted diuresis.    Pt was attempting to use bedpan upon OT entry into the room, complaining that it was not placed properly. Pt was agreeable to use the [facility], and then t/f to the chair. Pt presented with decreased activity tolerance, decreased safety awareness, strength deficits, balance deficits, UE tremors, and impaired judgement which impacted her ability to perform ADLs and functional t/fs safely and independently. Pt would benefit from continued skilled OT services to maximize her independence and minimize the caregiver burden for DC. After review of the patient's occupational profile and history, assessment of occupational performance, clinical decision making, and development of POC, the patient presents as a high complexity case. Pt is currently appropriate for a recommendation of 5xL this date.     Activity Tolerance During Today's Session  Tolerated treatment well, Limited by fatigue    Plan  Planned Frequency of Treatment: Plan of Care Initiated: 09/28/23  1-2x per day for: 3-4x week  Planned Treatment Duration: 10/12/23    Planned Interventions:  Education (Patient/Family/Caregiver), Self-Care/Home Training, Therapeutic Activity, Home Exercise Program, Manual Therapy, Modalities, Therapeutic Exercise      GOALS:   Short Term:   SHORT GOAL #1: Pt will safely perform a toileting routine including t/f mod I using LRAD as needed.   Time Frame : 2 weeks  SHORT GOAL #2: Pt will safely perform LBD mod I using LH AE as needed.   Time Frame : 2 weeks  SHORT GOAL #3: Pt will safely perform a 5+ minute seated ADL routine with set-up assist.   Time Frame : 2 weeks    Long Term Goal #1: Pt will score 24/24 on the AMPAC in 8 weeks.  Time Frame: 2 months    Prognosis:  Fair  Positive Indicators:  PLOF, supportive family  Barriers to Discharge: Endurance deficits, Functional strength deficits, Impaired Balance, Gait instability, Severity of deficits, Decreased safety awareness, Inability to safely perform ADLS    Subjective  Prior Functional Status Pt reported that she is typically mod I using her rollator for functional mobility. Pt does have a lift chair she often uses to stand, but can typically stand mod I from her bed using the rollator. Pt is typically independent for ADLs, with occasional assist for bathing from her daughter. Pt had a standard bed with a bedrail and she can typically get herself in/out of bed independently. Pt uses a  BSC for toileting at night, and the standard toilet during the day. Pt's daughter reported that the pt had one fall back in February. Per pt's daughter, the pt's daughter works days, and the pt's son in law works nights, so the pt is never home alone. Pt's daughter works at [employer] in [employer]. Pt is on 3L of O2 at baseline.     Medical Tests / Procedures: Reviewed in chart  Services patient receives prior to admission: OT, PT    Patient / Caregiver reports: She usually uses her rollator and can stand on her own pt's daughter stated.      Past Medical History:   Diagnosis Date    Acute kidney injury superimposed on chronic kidney disease (CMS-HCC) 10/11/2020    Acute on chronic diastolic (congestive) heart failure (CMS-HCC) 08/23/2020    Acute on chronic diastolic congestive heart failure (CMS-HCC)     AKI (acute kidney injury) (CMS-HCC) 04/14/2015    Lab Results  Component  Value  Date     CREATININE  1.90 (H)  06/12/2021     Had a bump in her creatinine when she was taking her diuretics every day.  She is currently taking 40 mg daily of torsemide and 50 mg of spironolactone.  Her volume status is fragile.  Previously when she stopped her diuretic she becomes short of breath.  Plan: We will check her BMP today.  We will likely have to go to 40    Anemia 08/22/2019    Iron deficiency anemia          Lab Results      Component    Value    Date           WBC    9.2    03/21/2021           RBC    3.67 (L)    03/21/2021           HGB    9.9 (L)    03/21/2021           HCT    30.7 (L)    03/21/2021           MCV    83.7    03/21/2021           MCH    26.9    03/21/2021           MCHC    32.1    03/21/2021           RDW    21.9 (H)    03/21/2021           PLT    318     Arthritis     Calculus of kidney     Calculus of ureter     CHF (congestive heart failure) (CMS-HCC)     Chronic atrial fibrillation (CMS-HCC) 07/20/2019    COPD (chronic obstructive pulmonary disease) (CMS-HCC)     Diabetes (CMS-HCC)     Gangrenous cholecystitis 10/11/2020    Generalized edema  06/17/2021    GERD (gastroesophageal reflux disease)     Hydronephrosis     Hypertension     Hyponatremia 10/11/2020    Intermediate coronary syndrome (CMS-HCC) 03/13/2014    Lower extremity edema 09/28/2020    Lumbar stenosis Microscopic hematuria     Nausea alone     Nephrolithiasis 04/17/2016    Neuropathy     Nocturia     Nocturia 07/01/2017    Other chronic  cystitis     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  See plans for anemia, AKI  We are also tapering her gabapentin.  Currently on 300 mg 3 times daily.  We will decrease it to twice daily, and then nightly.  She states she is not having any recurrence of her pain.    Pulmonary hypertension (CMS-HCC)     Renal colic     Shortness of breath 04/28/2020    Sleep apnea     Unstable angina pectoris (CMS-HCC) 03/13/2014    Social History     Tobacco Use    Smoking status: Former     Current packs/day: 0.00     Types: Cigarettes     Quit date: 2019     Years since quitting: 5.7    Smokeless tobacco: Never    Tobacco comments:     Quit a few years ago   Substance Use Topics    Alcohol use: No      Past Surgical History:   Procedure Laterality Date    BACK SURGERY  1995    CARPAL TUNNEL RELEASE Left 2014    HYSTERECTOMY  1971    IR INSERT CHOLECYSTOSMY TUBE PERCUTANEOUS  10/02/2020    IR INSERT CHOLECYSTOSMY TUBE PERCUTANEOUS 10/02/2020 Braulio Conte, MD IMG VIR H&V [facility]    LUMBAR DISC SURGERY      PR REMOVAL GALLBLADDER N/A 10/06/2020    Procedure: CHOLECYSTECTOMY;  Surgeon: Katherina Mires, MD;  Location: MAIN OR [facility];  Service: Trauma    PR RIGHT HEART CATH O2 SATURATION & CARDIAC OUTPUT N/A 09/30/2020    Procedure: Right Heart Catheterization;  Surgeon: Neal Dy, MD;  Location: [facility] CATH;  Service: Cardiology    PR RIGHT HEART CATH O2 SATURATION & CARDIAC OUTPUT N/A 01/09/2021    Procedure: Right Heart Catheterization;  Surgeon: Lesle Reek, MD;  Location: [facility] CATH;  Service: Cardiology    Family History   Problem Relation Age of Onset    Hypertension Mother     Cancer Father         COLON CANCER    No Known Problems Sister     No Known Problems Sister     No Known Problems Brother     No Known Problems Brother No Known Problems Maternal Aunt     No Known Problems Maternal Uncle     No Known Problems Paternal Aunt     No Known Problems Paternal Uncle     No Known Problems Maternal Grandmother     No Known Problems Maternal Grandfather     No Known Problems Paternal Grandmother     No Known Problems Paternal Grandfather     No Known Problems Other     Anesthesia problems Neg Hx     Broken bones Neg Hx     Clotting disorder Neg Hx     Collagen disease Neg Hx     Diabetes Neg Hx     Dislocations Neg Hx     Fibromyalgia Neg Hx     Gout Neg Hx     Hemophilia Neg Hx     Osteoporosis Neg Hx     Rheumatologic disease Neg Hx     Scoliosis Neg Hx     Severe sprains Neg Hx     Sickle cell anemia Neg Hx     Spinal Compression Fracture Neg Hx     GU problems Neg Hx  Kidney cancer Neg Hx     Prostate cancer Neg Hx         Nitrofurantoin and Lipitor [atorvastatin]     Objective Findings  Precautions / Restrictions  Falls precautions       Weight Bearing  Non-applicable    Required Braces or Orthoses  Non-applicable    Communication Preference  Verbal, Visual       Pain  Pt denied pain at this time. All activities were adjusted per pt's tolerance.    Equipment / Environment  Vascular access (PIV, TLC, Port-a-cath, PICC), Supplemental oxygen, Telemetry, Foley, Theone Murdoch (4L via [place])    Living Situation  Living Environment: House  Lives With: Daughter, Family (Daughter and son in Social worker)  Home Living: One level home, Walk-in shower, Grab bars in shower, Built-in shower seat, Shower chair with back, Ramped entrance, Standard height toilet, Bedside commode  Caregiver Identified?: Yes  Caregiver Availability: 24 hours  Caregiver Ability: Limited lifting  Equipment available at home: Rollator, Oxygen, Bedside commode, Lift-recliner (Shower chair with a back)     Cognition   Orientation Level:  Oriented x 4   Arousal/Alertness:  Appropriate responses to stimuli   Attention Span:  Difficulty dividing attention   Memory:  Appears intact   Following Commands:  Follows one step commands with increased time, Follows one step commands with repetition   Safety Judgment:  Decreased awareness of need for safety   Awareness of Errors and Problem Solving:  Assistance required to identify errors made, Assistance required to generate solutions, Assistance required to implement solutions    Vision / Hearing   Vision: Wears glasses all the time  Hearing: No deficit identified     Skin Inspection:  Skin Inspection: Intact where visualized    ROM / Strength:  UE ROM/Strength: Left Impaired/Limited, Right Impaired/Limited  RUE Impairment: Reduced strength  LUE Impairment: Reduced strength  LE ROM/Strength: Left Impaired/Limited, Right Impaired/Limited  RLE Impairment: Reduced strength  LLE Impairment: Reduced strength    Sensation:  RUE Sensation: RUE intact  LUE Sensation: LUE intact  RLE Sensation: RLE impaired  RLE Sensation Impairment: chronic peripheral neuropathy  LLE Sensation Impairment: chronic peripheral neuropathy    Balance:  Static Sitting-Level of Assistance: Contact guard  Dynamic Sitting-Level of Assistance: Contact guard    Static Standing-Level of Assistance: Moderate assistance, Maximum assistance  Dynamic Standing - Level of Assistance: Maximum assistance, Moderate assistance    Functional Mobility  Transfers:  (Pt performed sit > stand from the EOB w/ Max A. Pt required mod A for standing <> sitting on the BSC t/f. Pt t/f to the chair with mod A.)  Bed Mobility - Needs Assistance: Min assist, Mod assist (Semisupine > sitting EOB t/f with min A to manage her trunk. Pt required mod A to scoot her hips back in the chair.)  Ambulation: Pt took steps from EOB > BSC with mod A. Pt required max A for side steps from the [facility] > chair d/t poor balance.    ADLs  ADLs: Needs assistance with ADLs  ADLs - Needs Assistance: LB dressing, UB dressing, Toileting, Grooming, Feeding, Bathing  Feeding - Needs Assistance: Min assist (Seated on the BSC, pt required min A to grasp cup and drink d/t UE tremors)  Grooming - Needs Assistance: Mod assist (Mod A to comb hair seated on the BSC d/t UE tremors)  Bathing - Needs Assistance: Mod assist  Toileting - Needs Assistance: Mod assist (Pt required increased time for BM on  the [facility]. Pt was able to partially wipe while seated on the [facility], but required mod A to scoot forward to clean her buttocks. Pt required assistance for thoroughness.)  UB Dressing - Needs Assistance: Min assist  LB Dressing - Needs Assistance: Total Assist (To don socks semisupine in the bed. Per pt she typically wears slip on shoes.)  IADLs: Not assessed    Vitals / Orthostatics  Vitals/Orthostatics: Asymptomatic    Patient at end of session: All needs in reach, Friends/Family present, Lines intact, Nurse notified, Staff present, In chair     Occupational Therapy Session Duration  OT Individual [mins]: 37       AM-PAC-Daily Activity  Lower Body Dressing assistance needs: Unable to do/total assistance - Total Dependent Assist  Bathing assistance needs: A lot - Maximum/Moderate Assistance  Toileting assistance needs: A lot - Maximum/Moderate Assistance  Upper Body Dressing assistance needs: A Little - Minimal/Contact Guard Assist/Supervision  Personal Grooming assistance needs: A lot - Maximum/Moderate Assistance  Eating Meals assistance needs: A Little - Minimal/Contact Guard Assist/Supervision    Daily Activity Score: 13    Score (in points): % of Functional Impairment, Limitation, Restriction  6: 100% impaired, limited, restricted  7-8: At least 80%, but less than 100% impaired, limited restricted  9-13: At least 60%, but less than 80% impaired, limited restricted  14-19: At least 40%, but less than 60% impaired, limited restricted  20-22: At least 20%, but less than 40% impaired, limited restricted  23: At least 1%, but less than 20% impaired, limited restricted  24: 0% impaired, limited restricted    I attest that I have reviewed the above information.  Signed: Eben Burow, OT  Filed 09/28/2023

## 2023-09-28 NOTE — Unmapped (Signed)
Pt remains ICU status in CICU. A+Ox4. Pt very drowsy in afternoon which correlated with lower BP's. Stood with PT this AM. Required no PRNs. Afebrile. NSR/Afib today. Dopamine decreased this AM. Hypotensive this afternoon. NE gtt added. AUO via foley. Lasix gtt stopped. 2 BM's this shift.     Daughter at bedside intermittently throughout shift.     Problem: Adult Inpatient Plan of Care  Goal: Plan of Care Review  Outcome: Progressing  Goal: Patient-Specific Goal (Individualized)  Outcome: Progressing  Goal: Absence of Hospital-Acquired Illness or Injury  Outcome: Progressing  Intervention: Identify and Manage Fall Risk  Recent Flowsheet Documentation  Taken 09/27/2023 0800 by Mallie Mussel, RN  Safety Interventions:   aspiration precautions   bleeding precautions   commode/urinal/bedpan at bedside   family at bedside   lighting adjusted for tasks/safety   low bed   nonskid shoes/slippers when out of bed  Intervention: Prevent Skin Injury  Recent Flowsheet Documentation  Taken 09/27/2023 1600 by Mallie Mussel, RN  Positioning for Skin: Left  Taken 09/27/2023 1400 by Mallie Mussel, RN  Positioning for Skin: Right  Taken 09/27/2023 1200 by Mallie Mussel, RN  Positioning for Skin: Left  Taken 09/27/2023 1000 by Mallie Mussel, RN  Positioning for Skin: Right  Taken 09/27/2023 0800 by Mallie Mussel, RN  Positioning for Skin: Left  Device Skin Pressure Protection: absorbent pad utilized/changed  Skin Protection:   adhesive use limited   tubing/devices free from skin contact  Intervention: Prevent Infection  Recent Flowsheet Documentation  Taken 09/27/2023 0800 by Mallie Mussel, RN  Infection Prevention:   cohorting utilized   environmental surveillance performed   equipment surfaces disinfected   hand hygiene promoted   personal protective equipment utilized   rest/sleep promoted   single patient room provided  Goal: Optimal Comfort and Wellbeing  Outcome: Progressing  Goal: Readiness for Transition of Care  Outcome: Progressing  Goal: Rounds/Family Conference  Outcome: Progressing     Problem: Fluid Volume Excess  Goal: Fluid Balance  Outcome: Progressing  Intervention: Monitor and Manage Hypervolemia  Recent Flowsheet Documentation  Taken 09/27/2023 0800 by Mallie Mussel, RN  Skin Protection:   adhesive use limited   tubing/devices free from skin contact     Problem: Fall Injury Risk  Goal: Absence of Fall and Fall-Related Injury  Outcome: Progressing  Intervention: Promote Scientist, clinical (histocompatibility and immunogenetics) Documentation  Taken 09/27/2023 0800 by Mallie Mussel, RN  Safety Interventions:   aspiration precautions   bleeding precautions   commode/urinal/bedpan at bedside   family at bedside   lighting adjusted for tasks/safety   low bed   nonskid shoes/slippers when out of bed     Problem: Self-Care Deficit  Goal: Improved Ability to Complete Activities of Daily Living  Outcome: Progressing     Problem: Gas Exchange Impaired  Goal: Optimal Gas Exchange  Outcome: Progressing  Intervention: Optimize Oxygenation and Ventilation  Recent Flowsheet Documentation  Taken 09/27/2023 1600 by Mallie Mussel, RN  Head of Bed [facility]) Positioning: HOB at 30-45 degrees  Taken 09/27/2023 1400 by Mallie Mussel, RN  Head of Bed [facility]) Positioning: HOB at 30-45 degrees  Taken 09/27/2023 1200 by Mallie Mussel, RN  Head of Bed [facility]) Positioning: HOB at 30-45 degrees  Taken 09/27/2023 1000 by Mallie Mussel, RN  Head of Bed [facility]) Positioning: HOB at 30-45 degrees  Taken 09/27/2023 0800 by Mallie Mussel, RN  Head of Bed [facility]) Positioning:  HOB at 30-45 degrees     Problem: Noninvasive Ventilation Acute  Goal: Effective Unassisted Ventilation and Oxygenation  Outcome: Progressing     Problem: Heart Failure  Goal: Optimal Coping  Outcome: Progressing  Goal: Optimal Cardiac Output  Outcome: Progressing  Goal: Stable Heart Rate and Rhythm  Outcome: Progressing  Goal: Optimal Functional Ability  Outcome: Progressing  Intervention: Optimize Functional Ability  Recent Flowsheet Documentation  Taken 09/27/2023 1600 by Mallie Mussel, RN  Activity Management: bedrest  Taken 09/27/2023 1400 by Mallie Mussel, RN  Activity Management: bedrest  Taken 09/27/2023 1200 by Mallie Mussel, RN  Activity Management: bedrest  Taken 09/27/2023 1000 by Mallie Mussel, RN  Activity Management: bedrest  Taken 09/27/2023 0800 by Mallie Mussel, RN  Activity Management: bedrest  Goal: Fluid and Electrolyte Balance  Outcome: Progressing  Goal: Improved Oral Intake  Outcome: Progressing  Goal: Effective Oxygenation and Ventilation  Outcome: Progressing  Intervention: Promote Airway Secretion Clearance  Recent Flowsheet Documentation  Taken 09/27/2023 1600 by Mallie Mussel, RN  Activity Management: bedrest  Taken 09/27/2023 1400 by Mallie Mussel, RN  Activity Management: bedrest  Taken 09/27/2023 1200 by Mallie Mussel, RN  Activity Management: bedrest  Taken 09/27/2023 1000 by Mallie Mussel, RN  Activity Management: bedrest  Taken 09/27/2023 0800 by Mallie Mussel, RN  Activity Management: bedrest  Intervention: Optimize Oxygenation and Ventilation  Recent Flowsheet Documentation  Taken 09/27/2023 1600 by Mallie Mussel, RN  Head of Bed [facility]) Positioning: HOB at 30-45 degrees  Taken 09/27/2023 1400 by Mallie Mussel, RN  Head of Bed [facility]) Positioning: HOB at 30-45 degrees  Taken 09/27/2023 1200 by Mallie Mussel, RN  Head of Bed [facility]) Positioning: HOB at 30-45 degrees  Taken 09/27/2023 1000 by Mallie Mussel, RN  Head of Bed [facility]) Positioning: HOB at 30-45 degrees  Taken 09/27/2023 0800 by Mallie Mussel, RN  Head of Bed [facility]) Positioning: HOB at 30-45 degrees  Goal: Effective Breathing Pattern During Sleep  Outcome: Progressing     Problem: Skin Injury Risk Increased  Goal: Skin Health and Integrity  Outcome: Progressing  Intervention: Optimize Skin Protection  Recent Flowsheet Documentation  Taken 09/27/2023 1600 by Mallie Mussel, RN  Activity Management: bedrest  Pressure Reduction Techniques: frequent weight shift encouraged  Head of Bed [facility]) Positioning: HOB at 30-45 degrees  Taken 09/27/2023 1400 by Mallie Mussel, RN  Activity Management: bedrest  Pressure Reduction Techniques: frequent weight shift encouraged  Head of Bed [facility]) Positioning: HOB at 30-45 degrees  Taken 09/27/2023 1200 by Mallie Mussel, RN  Activity Management: bedrest  Pressure Reduction Techniques: frequent weight shift encouraged  Head of Bed [facility]) Positioning: HOB at 30-45 degrees  Taken 09/27/2023 1000 by Mallie Mussel, RN  Activity Management: bedrest  Pressure Reduction Techniques: frequent weight shift encouraged  Head of Bed [facility]) Positioning: HOB at 30-45 degrees  Taken 09/27/2023 0800 by Mallie Mussel, RN  Activity Management: bedrest  Pressure Reduction Techniques: frequent weight shift encouraged  Head of Bed (HOB) Positioning: HOB at 30-45 degrees  Pressure Reduction Devices: specialty bed utilized  Skin Protection:   adhesive use limited   tubing/devices free from skin contact

## 2023-09-28 NOTE — Unmapped (Signed)
CICU Progress Note    Hospital Day: 5    Hospital Course:       9/27: Patient admitted for acute on chronic heart failure  9/28: Patient transferred to CICU in the setting of need for increased hemodynamic with PA catheter monitoring on inotrope assisted diuresis. Net -2 L on lasix drip and dopamine infusion.   9/29: Diuresis continued. No significant changes.   9/30: Lasix gtt stopped, continued on dopamine, started on midodrine and NE  10/1: Lasix 120 mg IV x 1, stopped dopamine and NE, started on Eliquis 2.5 mg, Swan removed    Subjective / Interval History:      Overnight concerns that somnolence related to OxyContin dosing. Held AM dose and patient reports improvement in her somnolence. She is oriented to self, place, and year. Daughter at bedside and assisting patient with interview. Daughter states that prior to coming to hospital her mother was fairly independent- able to cook, clean, and get around. Lives at home with daughter and son-in-law. Patient denies chest pain or SOB. Patient unable to tolerate CPAP here, daughter will bring in home machine.    Assessment/Plan:        Principal Problem:    Acute exacerbation of CHF (congestive heart failure) (CMS-HCC)  Active Problems:    COPD (chronic obstructive pulmonary disease) (CMS-HCC)    Essential hypertension    Type 2 diabetes mellitus with stage 4 chronic kidney disease, with long-term current use of insulin (CMS-HCC)    Chronic respiratory failure with hypoxia (CMS-HCC)    Persistent atrial fibrillation (CMS-HCC)    Chronic kidney disease ([name]), stage IV (severe) (CMS-HCC)    Chronic prescription opiate use    Familial amyloid heart disease (CMS-HCC)      Barbara Huber is a 82 y.o. female with pertinent PMH of [name], cardiac amyloidosis, pulmonary hypertension, chronic respiratory failure on 3 L, COPD, [name] stage IV, atrial fibrillation, DM II who to the ED with acute on chronic heart failure exacerbation underwent inotrope assisted diuresis.    Neurological   Chronic Pain  Followed by the outpatient pain clinic [facility] in [place]. Takes OxyContin 12 mg 2 times a day.  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 Pt presented with O2 sats in the 80s requiring BiPap in the ED. Stable on 4L.Not tolerating CPAP overnight, daughter will bring in home CPAP.  - Wean oxygen as tolerated  - Breo Ellipta inhaler 1 puff daily  - continue home Singulair 10mg  daily   - PRN albuterol  - Home CPAP overnight    Cardiovascular   Acute on Chronic [name] Exacerbation - TTR Cardiac Amyloidosis:   Patient follows with cardiology outpatient in the cardiac amyloid clinic and has been seen in pulm HTN clinic as well. Home GDMT includes Torsemide 60 mg daily and PRN metolazone. Jardiance stopped due to UTIs and she had issues with hypotension with ARB and BB. For her hATTR amyloid with neuropathy leading to falls, she was started Amvuttra 25mg  q47mos in April 2024. 2nd injection July 2024. She is on Tafamidis  as well. Admitted from diuresis clinic. Underwent inotrope assisted diuresis with furosemide gtt, which was stopped on 9/30 after episodes of hypotension, prompting the addition of NE and midodrine to dopamine gtt. Pulmonary wedge pressure 12-13 prior to pulling swan.  - Stop NE gtt  - Stop dopamine gtt  - DC Swan  - Lasix 120 mg IV x 1  - Strict Is/Os; daily weights  - Continue home tafamidis 61 mg daily, holding Amvuttra while inpatient  - Vitamin A-3000 mcg RAE daily in setting of Amvuttra prescription      Hx of Persistent Atrial Fibrillation   CHA2DS2-VASc score = 5 (1-gender, 2-Age, 1-DM, 1-HTN). Is on chronic amiodarone 200 mg daily for atrial fibrillation. She was on eliquis in the past but stopped after episodes of anemia and blood in the stool. Never hospitalized for such. Patient at increased risk of stroke given A. Fib and amyloidosis so discussed with patient and family, agreeable to starting eliquis.  - Continue Amiodarone 200 mg daily   - Start eliquis 2.5 mg BID    HLD   Holding home Crestor as not available and patient had myopathy due to atorvastatin     Renal   Chronic Kidney Disease stage V  Baseline Cr 2.5-3. Creatinine on admission was 5.34. Follows with nephrology in the outpatient setting.  Renal function is improved in recent days in the setting of ongoing diuresis.  In discussions with nephrology there is a low concern for any amyloid activity in her kidneys given absence of significant proteinuria.  Instead they believe this is likely secondary to congestion. Cr continues to improve, now 2.47. Lasix gtt stopped on 9/30 and will spot dose lasix as needed. PWP 12-13 prior to swan removal.  - Diuresis as above  - Nephrology consulted, appreciate assistance  - Avoid hypotension and nephrotoxins  - Renally dose all meds    Infectious Disease   NAI    FEN/GI   GERD: continue home protonix 20 mg daily       Heme/Coag   NAI    Endocrine   DM II: Not currently on treatment. A1C 5.8 on 08/30/23     Prophylaxis   ? VTE: Eliquis 2.5 mg BID  ? GI: home protonix      Code Status: Full Code    Dispo: Pending Clinical Course      Objective:      Vitals - past 24 hours  Temp:  [37.1 ??C (98.7 ??F)-38 ??C (100.4 ??F)] 37.5 ??C (99.5 ??F)  Heart Rate:  [78-106] 79  SpO2 Pulse:  [84] 84  Resp:  [10-34] 20  BP: (73-156)/(40-84) 112/51  SpO2:  [93 %-100 %] 100 % Intake/Output  I/O last 3 completed shifts:  In: 2291.5 [P.O.:1300; I.V.:841.5; IV Piggyback:150]  Out: 3616 [Urine:3616]     Physical Exam:      Physical Exam   Constitutional: No distress.   HENT:   Nose: Nose normal. No nasal discharge.   Eyes: Conjunctivae are normal.   Neck: No JVD present.   Pulmonary/Chest: Effort normal and breath sounds normal. No stridor. She has no wheezes. She has no rales. She exhibits no tenderness.   Abdominal: Soft. Bowel sounds are normal. She exhibits no distension and no mass. There is no abdominal tenderness.   Musculoskeletal:         General: No tenderness, deformity or edema.      Cervical back: Neck supple.   Neurological: She is alert and oriented to person, place, and  time.   Skin: Skin is warm and dry. No rash noted. No cyanosis. No jaundice or pallor. Nails show no clubbing.           Continuous Infusions:    NORepinephrine bitartrate-NS 3 mcg/min (09/28/23 1000)    sodium chloride 10 mL/hr (09/28/23 1000)       Vent settings for last 24 hours: N/A       Tubes and Drains:  Patient Lines/Drains/Airways Status       Active Active Lines, Drains, & Airways       Name Placement date Placement time Site Days    Introducer 09/25/23 Internal jugular Right 09/25/23  1754  Internal jugular  2    PA Catheter 09/25/23 Internal jugular Right 09/25/23  1800  Internal jugular  2    Urethral Catheter 16 Fr. 09/24/23  1818  --  3    Peripheral IV 09/24/23 Left Antecubital 09/24/23  1324  Antecubital  3                    Data Review:   Lab Results   Component Value Date    WBC 9.9 09/28/2023    HGB 12.7 09/28/2023    HCT 36.8 09/28/2023    PLT 85 (L) 09/28/2023       Lab Results   Component Value Date    NA 138 09/28/2023    K 4.2 09/28/2023    CL 103 09/28/2023    CO2 29.0 09/28/2023    BUN 87 (H) 09/28/2023    CREATININE 2.47 (H) 09/28/2023    GLU 124 09/28/2023    CALCIUM 9.0 09/28/2023    MG 2.5 09/28/2023    PHOS 4.5 08/31/2023       Lab Results   Component Value Date    BILITOT 0.5 09/24/2023    BILIDIR 0.30 08/21/2023    PROT 6.2 09/25/2023    PROT 6.2 09/25/2023    ALBUMIN 3.1 (L) 09/25/2023    ALT 12 09/24/2023    AST 24 09/24/2023    ALKPHOS 154 (H) 09/24/2023    GGT 215 (H) 09/29/2020       Lab Results Component Value Date    PT 11.6 09/24/2023    INR 1.04 09/24/2023    APTT 46.9 (H) 01/10/2021           Kerrie Buffalo, DO  [facility]  Visiting Resident    Patient seen and discussed with attending, Dr. Tresea Mall.

## 2023-09-28 NOTE — Unmapped (Signed)
Barbara Huber remains ICU status. Cardiac monitor has shown SR with rare PAC/PVC tonight. T max 38 Cor at beginning of shift. Latest T 37.5 Cor. Right internal jugular PA catheter. CCO/FICK numbers and calculations done every 4 hours. ZO10 re calibrated with am labs.norepi titrated to keep MAP> 65. Norepi currently at 4 mcg/min. Dopamine remains at 1 mcg/kg/min. Urine output adequate without lasix drip. She continues to be on and off bedpan multiple times every shift. She has had 2 BM's tonight and is receiving miralax and senna scheduled. She and her daughter report sleepiness during the day. Discussed with Barbara Huber, her daughter and [facility] resident possibility of changing oxycodone dose and possibly using other med since she reports oxycodone is used for neuropathy pain. Please see EPIC charting for details of assessment, lab results and vital signs. Will continue to monitor and assess.      Problem: Adult Inpatient Plan of Care  Goal: Plan of Care Review  Outcome: Progressing  Goal: Patient-Specific Goal (Individualized)  Outcome: Progressing  Goal: Absence of Hospital-Acquired Illness or Injury  Outcome: Progressing  Intervention: Identify and Manage Fall Risk  Recent Flowsheet Documentation  Taken 09/27/2023 2000 by Salem Caster, RN  Safety Interventions:   aspiration precautions   bariatric safety   bleeding precautions   environmental modification   fall reduction program maintained   family at bedside   infection management   lighting adjusted for tasks/safety   low bed  Intervention: Prevent Skin Injury  Recent Flowsheet Documentation  Taken 09/28/2023 0600 by Salem Caster, RN  Positioning for Skin: Left  Taken 09/28/2023 0400 by Salem Caster, RN  Positioning for Skin: Supine/Back  Taken 09/28/2023 0200 by Salem Caster, RN  Positioning for Skin: Left  Taken 09/28/2023 0000 by Salem Caster, RN  Positioning for Skin: Supine/Back  Taken 09/27/2023 2200 by Salem Caster, RN  Positioning for Skin: Right  Taken 09/27/2023 2045 by Salem Caster, RN  Positioning for Skin: Left  Taken 09/27/2023 2000 by Salem Caster, RN  Positioning for Skin: Supine/Back  Device Skin Pressure Protection:   absorbent pad utilized/changed   adhesive use limited   pressure points protected   skin-to-skin areas padded   tubing/devices free from skin contact  Skin Protection:   adhesive use limited   incontinence pads utilized   protective footwear used   silicone foam dressing in place   skin-to-skin areas padded   tubing/devices free from skin contact  Intervention: Prevent and Manage VTE (Venous Thromboembolism) Risk  Recent Flowsheet Documentation  Taken 09/27/2023 2000 by Salem Caster, RN  VTE Prevention/Management: anticoagulant therapy  Intervention: Prevent Infection  Recent Flowsheet Documentation  Taken 09/27/2023 2000 by Salem Caster, RN  Infection Prevention:   cohorting utilized   environmental surveillance performed   equipment surfaces disinfected   hand hygiene promoted   personal protective equipment utilized   rest/sleep promoted   single patient room provided  Goal: Optimal Comfort and Wellbeing  Outcome: Progressing  Goal: Readiness for Transition of Care  Outcome: Progressing  Goal: Rounds/Family Conference  Outcome: Progressing     Problem: Fluid Volume Excess  Goal: Fluid Balance  Outcome: Progressing  Intervention: Monitor and Manage Hypervolemia  Recent Flowsheet Documentation  Taken 09/27/2023 2000 by Salem Caster, RN  Skin Protection:   adhesive use limited   incontinence pads utilized   protective footwear used   silicone foam dressing in place   skin-to-skin areas padded  tubing/devices free from skin contact     Problem: Fall Injury Risk  Goal: Absence of Fall and Fall-Related Injury  Outcome: Progressing  Intervention: Promote Scientist, clinical (histocompatibility and immunogenetics) Documentation  Taken 09/27/2023 2000 by Salem Caster, RN  Safety Interventions:   aspiration precautions   bariatric safety   bleeding precautions   environmental modification   fall reduction program maintained   family at bedside   infection management   lighting adjusted for tasks/safety   low bed     Problem: Self-Care Deficit  Goal: Improved Ability to Complete Activities of Daily Living  Outcome: Progressing     Problem: Gas Exchange Impaired  Goal: Optimal Gas Exchange  Outcome: Progressing  Intervention: Optimize Oxygenation and Ventilation  Recent Flowsheet Documentation  Taken 09/28/2023 0400 by Salem Caster, RN  Head of Bed [facility]) Positioning: (flat for PA numbers)   HOB flat   HOB at 30-45 degrees  Taken 09/28/2023 0200 by Salem Caster, RN  Head of Bed [facility]) Positioning: HOB at 30-45 degrees  Taken 09/27/2023 2200 by Salem Caster, RN  Head of Bed [facility]) Positioning: HOB at 30-45 degrees  Taken 09/27/2023 2000 by Salem Caster, RN  Head of Bed [facility]) Positioning: (flat for PA numbers)   HOB flat   HOB at 30-45 degrees     Problem: Noninvasive Ventilation Acute  Goal: Effective Unassisted Ventilation and Oxygenation  Outcome: Progressing     Problem: Heart Failure  Goal: Optimal Coping  Outcome: Progressing  Goal: Optimal Cardiac Output  Outcome: Progressing  Goal: Stable Heart Rate and Rhythm  Outcome: Progressing  Goal: Optimal Functional Ability  Outcome: Progressing  Intervention: Optimize Functional Ability  Recent Flowsheet Documentation  Taken 09/27/2023 2000 by Salem Caster, RN  Activity Management: bedrest  Goal: Fluid and Electrolyte Balance  Outcome: Progressing  Goal: Improved Oral Intake  Outcome: Progressing  Goal: Effective Oxygenation and Ventilation  Outcome: Progressing  Intervention: Promote Airway Secretion Clearance  Recent Flowsheet Documentation  Taken 09/27/2023 2000 by Salem Caster, RN  Activity Management: bedrest  Intervention: Optimize Oxygenation and Ventilation  Recent Flowsheet Documentation  Taken 09/28/2023 0400 by Salem Caster, RN  Head of Bed [facility]) Positioning: (flat for PA numbers)   HOB flat   HOB at 30-45 degrees  Taken 09/28/2023 0200 by Salem Caster, RN  Head of Bed [facility]) Positioning: HOB at 30-45 degrees  Taken 09/27/2023 2200 by Salem Caster, RN  Head of Bed [facility]) Positioning: HOB at 30-45 degrees  Taken 09/27/2023 2000 by Salem Caster, RN  Head of Bed [facility]) Positioning: (flat for PA numbers)   HOB flat   HOB at 30-45 degrees  Goal: Effective Breathing Pattern During Sleep  Outcome: Progressing     Problem: Skin Injury Risk Increased  Goal: Skin Health and Integrity  Outcome: Progressing  Intervention: Optimize Skin Protection  Recent Flowsheet Documentation  Taken 09/28/2023 0400 by Salem Caster, RN  Head of Bed [facility]) Positioning: (flat for PA numbers)   HOB flat   HOB at 30-45 degrees  Taken 09/28/2023 0200 by Salem Caster, RN  Head of Bed [facility]) Positioning: HOB at 30-45 degrees  Taken 09/27/2023 2200 by Salem Caster, RN  Head of Bed [facility]) Positioning: HOB at 30-45 degrees  Taken 09/27/2023 2000 by Salem Caster, RN  Activity Management: bedrest  Pressure Reduction Techniques:   frequent weight shift encouraged   heels elevated off bed   pressure points  protected  Head of Bed (HOB) Positioning: (flat for PA numbers)   HOB flat   HOB at 30-45 degrees  Pressure Reduction Devices:   pressure-redistributing mattress utilized   specialty bed utilized  Skin Protection:   adhesive use limited   incontinence pads utilized   protective footwear used   silicone foam dressing in place   skin-to-skin areas padded   tubing/devices free from skin contact

## 2023-09-29 LAB — HEPATIC FUNCTION PANEL
ALBUMIN: 3 g/dL — ABNORMAL LOW (ref 3.4–5.0)
ALKALINE PHOSPHATASE: 120 U/L — ABNORMAL HIGH (ref 46–116)
ALT (SGPT): 8 U/L — ABNORMAL LOW (ref 10–49)
AST (SGOT): 22 U/L (ref <=34)
BILIRUBIN DIRECT: 0.7 mg/dL — ABNORMAL HIGH (ref 0.00–0.30)
BILIRUBIN TOTAL: 1.3 mg/dL — ABNORMAL HIGH (ref 0.3–1.2)
PROTEIN TOTAL: 6.6 g/dL (ref 5.7–8.2)

## 2023-09-29 LAB — CBC
HEMATOCRIT: 36.8 % (ref 34.0–44.0)
HEMOGLOBIN: 12.2 g/dL (ref 11.3–14.9)
MEAN CORPUSCULAR HEMOGLOBIN CONC: 33.1 g/dL (ref 32.0–36.0)
MEAN CORPUSCULAR HEMOGLOBIN: 30.6 pg (ref 25.9–32.4)
MEAN CORPUSCULAR VOLUME: 92.4 fL (ref 77.6–95.7)
MEAN PLATELET VOLUME: 9.4 fL (ref 6.8–10.7)
PLATELET COUNT: 81 10*9/L — ABNORMAL LOW (ref 150–450)
RED BLOOD CELL COUNT: 3.98 10*12/L (ref 3.95–5.13)
RED CELL DISTRIBUTION WIDTH: 14.7 % (ref 12.2–15.2)
WBC ADJUSTED: 9.9 10*9/L (ref 3.6–11.2)

## 2023-09-29 LAB — BASIC METABOLIC PANEL
ANION GAP: 8 mmol/L (ref 5–14)
BLOOD UREA NITROGEN: 94 mg/dL — ABNORMAL HIGH (ref 9–23)
BUN / CREAT RATIO: 37
CALCIUM: 9.6 mg/dL (ref 8.7–10.4)
CHLORIDE: 97 mmol/L — ABNORMAL LOW (ref 98–107)
CO2: 31 mmol/L (ref 20.0–31.0)
CREATININE: 2.53 mg/dL — ABNORMAL HIGH
EGFR CKD-EPI (2021) FEMALE: 19 mL/min/{1.73_m2} — ABNORMAL LOW (ref >=60)
GLUCOSE RANDOM: 112 mg/dL (ref 70–179)
POTASSIUM: 4.2 mmol/L (ref 3.4–4.8)
SODIUM: 136 mmol/L (ref 135–145)

## 2023-09-29 LAB — MONOCLONAL GAMMOPATHY CHEMISTRIES
GAMMAGLOBULIN; IGA: 314.2 mg/dL (ref 70.0–400.0)
GAMMAGLOBULIN; IGG: 1218 mg/dL (ref 650–1600)
GAMMAGLOBULIN; IGM: 21 mg/dL — ABNORMAL LOW (ref 40–230)
PROTEIN TOTAL (SPECIAL CHEM): 6.2 g/dL (ref 5.7–8.2)

## 2023-09-29 LAB — MAGNESIUM: MAGNESIUM: 2.6 mg/dL (ref 1.6–2.6)

## 2023-09-29 LAB — PRO-BNP: PRO-BNP: 17737 pg/mL — ABNORMAL HIGH (ref <=300.0)

## 2023-09-29 MED ADMIN — tafamidis cap 61 mg **PATIENT-SUPPLIED**: 61 mg | ORAL | @ 13:00:00

## 2023-09-29 MED ADMIN — torsemide (DEMADEX) tablet 60 mg: 60 mg | ORAL | @ 22:00:00 | Stop: 2023-09-29

## 2023-09-29 MED ADMIN — amiodarone (PACERONE) tablet 200 mg: 200 mg | ORAL | @ 13:00:00

## 2023-09-29 MED ADMIN — apixaban (ELIQUIS) tablet 2.5 mg: 2.5 mg | ORAL | @ 01:00:00

## 2023-09-29 MED ADMIN — apixaban (ELIQUIS) tablet 2.5 mg: 2.5 mg | ORAL | @ 13:00:00

## 2023-09-29 MED ADMIN — midodrine (PROAMATINE) tablet 5 mg: 5 mg | ORAL | @ 19:00:00

## 2023-09-29 MED ADMIN — gabapentin (NEURONTIN) capsule 100 mg: 100 mg | ORAL | @ 13:00:00

## 2023-09-29 MED ADMIN — vitamin A-3,000 mcg RAE (10,000 UNIT) capsule 3,000 mcg of RAE: 3000 ug | ORAL | @ 13:00:00

## 2023-09-29 MED ADMIN — midodrine (PROAMATINE) tablet 5 mg: 5 mg | ORAL | @ 13:00:00

## 2023-09-29 MED ADMIN — gabapentin (NEURONTIN) capsule 100 mg: 100 mg | ORAL | @ 01:00:00

## 2023-09-29 MED ADMIN — fluticasone furoate-vilanterol (BREO ELLIPTA) 100-25 mcg/dose inhaler 1 puff: 1 | RESPIRATORY_TRACT | @ 13:00:00

## 2023-09-29 MED ADMIN — umeclidinium (INCRUSE ELLIPTA) 62.5 mcg/actuation inhaler 1 puff: 1 | RESPIRATORY_TRACT | @ 13:00:00

## 2023-09-29 MED ADMIN — midodrine (PROAMATINE) tablet 5 mg: 5 mg | ORAL | @ 22:00:00

## 2023-09-29 MED ADMIN — montelukast (SINGULAIR) tablet 10 mg: 10 mg | ORAL | @ 13:00:00

## 2023-09-29 MED ADMIN — pantoprazole (Protonix) EC tablet 20 mg: 20 mg | ORAL | @ 13:00:00

## 2023-09-29 NOTE — Unmapped (Signed)
Nephrology Consult Note    Requesting Attending Physician :  Denny Peon, MD  Service Requesting Consult : Heart Failure (MDD)  Reason for Consult: concern for renal involvement of amyloid    Assessment and Plan:    # AKI on [name] 4   - Baseline EGFR ~15-20 (cr ~2.6), follows with [facility]. [name] attributed to hypertension and diabetes. Recently with worsening renal functions attributed to cardiorenal syndrome.Has had similar admissions in 01/2023 with volume overload. Urine sediment remarkable for yeast, 50% dysmorphic RBCs per HPF and waxy casts. Currently consistent with cardiorenal given improvement with decongestion and now hopefully approaching near euvolemia  - Cr down to 2.47 (at baseline) and pressures softer off pressor/inotrop  - CVP 7, PCWP ~12  - agree with maintenance diuresis  -Do not suspect renal involvement of ATTR as she has had no proteinuria. With amyloidosis, would have expected to see tubular proteinuria and pt has not had an elevated UPCR since 2022. Last MM work up back in 2021 was negative for IFE and SFLC were within normal limits. Faint IgG kappa and IgG lambda too low to quantify so do not suspect this is affecting renal function    # ATTR   #Acute on chronic heart failure   - Evaluation and management per primary team  - No changes to management from a nephrology standpoint at this time    RECOMMENDATIONS:   - agree with maintenance diuresis per cardiology  - We will continue to follow.     Clyde Lundborg, MD  09/28/2023 7:56 PM     Medical decision-making for 09/28/23  Findings / Data     Patient has: []  acute illness w/systemic sxs  [mod]  []  two or more stable chronic illnesses [mod]  []  one chronic illness with acute exacerbation [mod]  []  acute complicated illness  [mod]  []  Undiagnosed new problem with uncertain prognosis  [mod] [x]  illness posing risk to life or bodily function (ex. AKI)  [high]  []  chronic illness with severe exacerbation/progression [high]  []  chronic illness with severe side effects of treatment  [high] AKI [name] 5 Probs At least 2:  Probs, Data, Risk   I reviewed: [x]  primary team note  [x]  consultant note(s)  [x]  external records [x]  chemistry results  [x]  CBC results  []  blood gas results  []  Other []  procedure/op note(s)   []  radiology report(s)  []  micro result(s)  []  w/ independent historian(s) Cr 2.47; Hgb 12.7 >=3 Data Review (2 of 3)    I independently interpreted: []  Urine Sediment  []  Renal US []  CXR Images  []  CT Images  []  Other []  EKG Tracing  Any     I discussed: []  Pathology results w/ QHPs(s) from other specialties  []  Procedural findings w/ QHPs(s) from other specialties []  Imaging w/ QHP(s) from other specialties  [x]  Treatment plan w/ QHP(s) from other specialties Plan discussed with primary team Any     Mgm't requires: []  Prescription drug(s)  [mod]  []  Kidney biopsy  [mod]  []  Central line placement  [mod] []  High risk medication use and/or intensive toxicity monitoring [high]  []  Renal replacement therapy [high]  []  High risk kidney biopsy  [high]  []  Escalation of care  [high]  []  High risk central line placement  [high] [name]: check BMP/Mg  Risk      ____________________________________________________    Interval Hx: Reports feeling better this am.Creatinine down to 2.47; palliative care seeing patient and daughter  Good UOP though softer pressures now off lasix gtt and off dopamine/NE    Physical Exam:   Vitals:    09/28/23 1601 09/28/23 1738 09/28/23 1800 09/28/23 1900   BP: 92/46 129/73 109/46 128/80   Pulse: 90 90 89 99   Resp: 18  17 20    Temp:       TempSrc:       SpO2:       Weight:         No intake/output data recorded.    Intake/Output Summary (Last 24 hours) at 09/28/2023 1957  Last data filed at 09/28/2023 1800  Gross per 24 hour   Intake 968.39 ml   Output 1925 ml   Net -956.61 ml     Constitutional: tired-appearing, no acute distress  Heart: RRR  Lungs: crackles in bases R > L  Abd: non-distended  Ext: no significant edema

## 2023-09-29 NOTE — Unmapped (Signed)
Patient remains in the CICU. Patient dopamine and norepinephrine gtt discontinued per MD order. Patient remains normotensive and in NSR.    Patient SWAN catheter was removed per MD order. Introducer left in place per MD order. Lasix bolus was administered as ordered. Adequate urine output throughout shift. See flowsheet and MAR for specific details.    Patient is alert and oriented x4 but slow to respond and reports being tired throughout the day.     Daughter was present for patient rounds and all questions were answered by RN and MD staff.     Problem: Adult Inpatient Plan of Care  Goal: Patient-Specific Goal (Individualized)  Outcome: Progressing     Problem: Adult Inpatient Plan of Care  Goal: Optimal Comfort and Wellbeing  Outcome: Progressing     Problem: Fluid Volume Excess  Goal: Fluid Balance  Outcome: Progressing  Intervention: Monitor and Manage Hypervolemia  Recent Flowsheet Documentation  Taken 09/28/2023 0800 by Anfernee Peschke, Haynes Hoehn, RN  Skin Protection:   adhesive use limited   incontinence pads utilized   tubing/devices free from skin contact     Problem: Self-Care Deficit  Goal: Improved Ability to Complete Activities of Daily Living  Outcome: Progressing     Problem: Heart Failure  Goal: Optimal Coping  Outcome: Progressing     Problem: Heart Failure  Goal: Optimal Cardiac Output  Outcome: Progressing     Problem: Heart Failure  Goal: Stable Heart Rate and Rhythm  Outcome: Progressing     Problem: Heart Failure  Goal: Optimal Functional Ability  Outcome: Progressing

## 2023-09-29 NOTE — Unmapped (Signed)
CICU Progress Note    Hospital Day: 6    Hospital Course:       9/27: Patient admitted for acute on chronic heart failure  9/28: Patient transferred to CICU in the setting of need for increased hemodynamic with PA catheter monitoring on inotrope assisted diuresis. Net -2 L on lasix drip and dopamine infusion.   9/29: Diuresis continued. No significant changes.   9/30: Lasix gtt stopped, continued on dopamine, started on midodrine and NE  10/1: Lasix 120 mg IV x 1, stopped dopamine and NE, started on Eliquis 2.5 mg, Swan removed  10/2: cordis and foley removed, down graded to floor status    Subjective / Interval History:      No acute events overnight. Holding oxycontin in setting of somnolence, patient has not required PRN dosing of oxycodone. She states that she is not having any pain and she feels more alert this morning. Wore CPAP for most of the night but notes that she removed intermittently because it became uncomfortable. Denies any chest discomfort or SOB. Endorses difficulties with ambulation and noted that she needed a great deal of assistance moving from bed to chair yesterday. Discussed with patient and family dispo and patient at this point prefers to attempt to go home with home PT/OT. We discussed that she is significantly weaker during this admission compared to prior, but patient would still like to go home over SNF placement.    Assessment/Plan:        Principal Problem:    Acute exacerbation of CHF (congestive heart failure) (CMS-HCC)  Active Problems:    COPD (chronic obstructive pulmonary disease) (CMS-HCC)    Essential hypertension    Type 2 diabetes mellitus with stage 4 chronic kidney disease, with long-term current use of insulin (CMS-HCC)    Chronic respiratory failure with hypoxia (CMS-HCC)    Persistent atrial fibrillation (CMS-HCC)    Chronic kidney disease ([name]), stage IV (severe) (CMS-HCC)    Chronic prescription opiate use    Familial amyloid heart disease (CMS-HCC)      Barbara Huber is a 82 y.o. female with pertinent PMH of [name], cardiac amyloidosis, pulmonary hypertension, chronic respiratory failure on 3 L, COPD, [name] stage IV, atrial fibrillation, DM II who to the ED with acute on chronic heart failure exacerbation underwent inotrope assisted diuresis.    Neurological   Chronic Pain  Followed by the outpatient pain clinic [facility] in [place]. Takes OxyContin 12 mg 2 times a day.  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 Pt presented with O2 sats in the 80s requiring BiPap in the ED. Stable on 4L and saturating well. Notes desaturations with ambulation. Followed with [facility] in the past and picture c/w mild obstructive pattern.   - Wean oxygen as tolerated  - Breo Ellipta inhaler 1 puff daily  - continue home Singulair 10mg  daily   - PRN albuterol  - Home CPAP     Cardiovascular   Acute on Chronic [name] Exacerbation - TTR Cardiac Amyloidosis   Patient follows with cardiology outpatient in  the cardiac amyloid clinic and has been seen in pulm HTN clinic as well. Home GDMT includes Torsemide 60 mg daily and PRN metolazone. Jardiance stopped due to UTIs and she had issues with hypotension with ARB and BB. For her hATTR amyloid with neuropathy leading to falls, she was started Amvuttra 25mg  q81mos in April 2024. 2nd injection July 2024. She is on Tafamidis as well. Admitted from diuresis clinic. Underwent inotrope assisted diuresis with furosemide gtt, which was stopped on 9/30 after episodes of hypotension, prompting the addition of NE and midodrine to dopamine gtt, which was stopped on 10/1. Pulmonary wedge pressure 12 prior to pulling swan. She received lasix 120 mg IV x1 on 10/1, on 10/2 with slight bump in Cr. Will collect orthostatic vital signs and if no signs of orthostasis then we will start home torsemide 60 mg daily. If she is orthostatic then we will plan to hold torsemide and re-evaluate tomorrow.  - Orthostatic vital signs  - Consider home torsemide 60 mg today if not orthostatic  - Remove foley  - Remove cordis   - Downgrade to floor status  - Strict Is/Os; daily weights  - Continue midodrine 5 mg TID, hold if BP >140/90  - Continue home tafamidis 61 mg daily, holding Amvuttra while inpatient  - Vitamin A-3000 mcg RAE daily in setting of Amvuttra prescription      Hx of Persistent Atrial Fibrillation   CHA2DS2-VASc score = 5 (1-gender, 2-Age, 1-DM, 1-HTN). Is on chronic amiodarone 200 mg daily for atrial fibrillation. She was on eliquis in the past but stopped after episodes of anemia and blood in the stool. Never hospitalized for such. Patient at increased risk of stroke given A. Fib and amyloidosis so started on Eliquis.   - Continue Amiodarone 200 mg daily   - Continue eliquis 2.5 mg BID    HLD   Holding home Crestor as not available and patient had myopathy due to atorvastatin     Renal   Chronic Kidney Disease stage V  Baseline Cr 2.5-3. Creatinine on admission was 5.34. Follows with nephrology in the outpatient setting.  Renal function is improved in recent days in the setting of ongoing diuresis.  In discussions with nephrology there is a low concern for any amyloid activity in her kidneys given absence of significant proteinuria.  Instead they believe this is likely secondary to congestion. Cr continues to improve, now 2.47. Lasix gtt stopped on 9/30 and will spot dose lasix as needed. PWP 12 on 10/1 prior to swan removal. Nephrology recommends maintenance diuresis.   - Diuresis as above  - Nephrology consulted, appreciate assistance, signing off 10/2  - Avoid hypotension and nephrotoxins  - Renally dose all meds    Infectious Disease   NAI    FEN/GI   GERD: continue home protonix 20 mg daily       Heme/Coag   NAI    Endocrine   DM II: Not currently on treatment. A1C 5.8 on 08/30/23     Prophylaxis   ? VTE: Eliquis 2.5 mg BID  ? GI: home protonix      Code Status: Full Code    Dispo: Pending Clinical Course      Objective:      Vitals - past 24 hours  Temp:  [36.6 ??C (97.9 ??F)-37.2 ??C (99 ??F)] 37.2 ??C (99 ??F)  Heart Rate:  [75-106] 94  Resp:  [10-34] 18  BP: (92-146)/(46-87) 142/87  SpO2:  [93 %-100 %] 96 % Intake/Output  I/O last 3 completed shifts:  In: 1568.4 [P.O.:970; I.V.:543.4; IV Piggyback:55]  Out: 2560 [Urine:2560]     Physical Exam:      Physical Exam   Constitutional: No distress.   HENT:   Nose: Nose normal. No nasal discharge.   Eyes: Conjunctivae are normal.   Neck: No JVD present.   Pulmonary/Chest: Effort normal and breath sounds normal. No stridor. She has no wheezes. She has no rales. She exhibits no tenderness.   Abdominal: Soft. Bowel sounds are normal. She exhibits no distension and no mass. There is no abdominal tenderness.   Musculoskeletal:         General: No tenderness, deformity or edema.      Cervical back: Neck supple.   Neurological: She is alert and oriented to person, place, and time.   Skin: Skin is warm and dry. No rash noted. No cyanosis. No jaundice or pallor. Nails show no clubbing.           Continuous Infusions:    NORepinephrine bitartrate-NS Stopped (09/28/23 1245)    sodium chloride 10 mL/hr (09/29/23 0600)       Vent settings for last 24 hours: N/A       Tubes and Drains:  Patient Lines/Drains/Airways Status       Active Active Lines, Drains, & Airways       Name Placement date Placement time Site Days    Introducer 09/25/23 Internal jugular Right 09/25/23  1754  Internal jugular  3    Urethral Catheter 16 Fr. 09/24/23  1818  --  4    Peripheral IV 09/24/23 Left Antecubital 09/24/23  1324  Antecubital  4                    Data Review:   Lab Results   Component Value Date    WBC 9.9 09/29/2023    HGB 12.2 09/29/2023    HCT 36.8 09/29/2023    PLT 81 (L) 09/29/2023       Lab Results   Component Value Date    NA 136 09/29/2023    K 4.2 09/29/2023    CL 97 (L) 09/29/2023    CO2 31.0 09/29/2023    BUN 94 (H) 09/29/2023    CREATININE 2.53 (H) 09/29/2023    GLU 112 09/29/2023    CALCIUM 9.6 09/29/2023    MG 2.6 09/29/2023    PHOS 4.5 08/31/2023       Lab Results   Component Value Date    BILITOT 0.5 09/24/2023    BILIDIR 0.30 08/21/2023    PROT 6.2 09/25/2023    PROT 6.2 09/25/2023    ALBUMIN 3.1 (L) 09/25/2023    ALT 12 09/24/2023    AST 24 09/24/2023    ALKPHOS 154 (H) 09/24/2023    GGT 215 (H) 09/29/2020       Lab Results   Component Value Date    PT 11.6 09/24/2023    INR 1.04 09/24/2023    APTT 46.9 (H) 01/10/2021           Kerrie Buffalo, DO  [facility]  Visiting Resident    Patient seen and discussed with attending, Dr. Tresea Mall.

## 2023-09-29 NOTE — Unmapped (Signed)
Nephrology Treatment Plan     Assessment and Plan:     # AKI on [name] 4   - Baseline EGFR ~15-20 (cr ~2.6), follows with [facility]. [name] attributed to hypertension and diabetes. Now presenting with cardiorenal syndrome. Urine sediment remarkable for yeast, 50% dysmorphic RBCs per HPF and waxy casts. Improvement with decongestion and now hopefully approaching near euvolemia  - Cr at baseline  - agree with maintenance diuresis  -  Patient has nephrology follow-up 11/11/2023  -Do not suspect renal involvement of ATTR as she has had no proteinuria. With amyloidosis, would have expected to see tubular proteinuria and pt has not had an elevated UPCR since 2022. Last MM work up back in 2021 was negative for IFE and SFLC were within normal limits. Faint IgG kappa and IgG lambda too low to quantify so do not suspect this is affecting renal function      We will sign off. Please let us know if we can be of further assistance.  Clyde Lundborg, MD  [facility], PGY-5

## 2023-09-29 NOTE — Unmapped (Signed)
Care Management  Initial Transition Planning Assessment              General  Care Manager assessed the patient by : In person interview with patient, In person interview with family, Medical record review, Discussion with Clinical Care team  Orientation Level: Oriented X4  Functional level prior to admission: Independent  Reason for referral: Discharge Planning    Contact/Decision Maker  Extended Emergency Contact Information  Primary Emergency Contact: [name],[name]  Address: [address]           [place], [place] [address] [place]  Home Phone: [phone]  Mobile Phone: [phone]  Relation: Daughter    Legal Next of Kin / Guardian / POA / Advance Directives     HCDM (patient stated preference): [name],[name] - Daughter - [phone]    Advance Directive (Medical Treatment)  Does patient have an advance directive covering medical treatment?: Patient does not have advance directive covering medical treatment.  Reason patient does not have an advance directive covering medical treatment:: Patient does not wish to complete one at this time.    Health Care Decision Maker [HCDM] (Medical & Mental Health Treatment)  Healthcare Decision Maker: HCDM documented in the HCDM/Contact Info section.  Information offered on HCDM, Medical & Mental Health advance directives:: Patient declined information.    Advance Directive (Mental Health Treatment)  Does patient have an advance directive covering mental health treatment?: Patient does not have advance directive covering mental health treatment.  Reason patient does not have an advance directive covering mental health treatment:: HCDM documented in the HCDM/Contact Info section.    Readmission Information    Have you been hospitalized in the last 30 days?: Yes  Name of Hospital: [facility]  Were you being cared for at a skilled nursing facility:: No     What day were you discharged from that hospital or facility?: 09/02/23  Number of Days between previous discharge and readmission date: 15-30 days    Type of Readmission: Related to Previous Admission    Readmission Source: Home       Did the following happen with your discharge?          Did you get your discharge medications?  : Yes       Did you go to your scheduled follow up appointment with your doctor on discharge?: Yes       Which services or equipment arranged after your discharge arrived?: Home Health    Did you understand your discharge instructions?: Yes       Contributing Factors for Readmission: Worsening Clinical Condition    Did you feel prepared for discharge?: Yes    CM talked with patient and [name], daughter at bedside to complete assessment as well as to confirm all information pertaining to any discharge planning needs. CM introduced self, explained role, and confirmed demographic information.     Patient lives with daughter Mardi Mainland, daughter and her husband in a one-level home with ramp entrance.  Per daughter patient independent at baseline and daughter provides assistance as needed.  DME consist of rollator, walk-in shower, shower chair, and attachable handrail on bed.  Patient currently on service with [facility] receiving [facility] RN/PT.    Per discussion with patient and daughter prefers patient return home with [facility] services.  Per daughter herself and her husband are able to provide assistance to patient at home.    Patient Information  Lives with: Children, Other (Comment) (Adult daughter and  her husband)    Type of Residence: Private residence        Location/Detail: one level home with ramp entrance    Support Systems/Concerns: Family Members, Children    Responsibilities/Dependents at home?: No    Home Care services in place prior to admission?: Yes  Type of Home Care services in place prior to admission: Home PT, Home OT, Home nursing visits  Current Home Care provider (Name/Phone #): Frances Furbish            Equipment Currently Used at Home: shower chair, other (see comments) (walk-in shower, rollator)       Currently receiving outpatient dialysis?: No       Financial Information       Need for financial assistance?: No      Type of Residence: Mailing Address:  [address]  [place] [address]  Contacts: Accompanied by: Family member  Patient Phone Number: [phone] (home)       Medical Provider(s): Artelia Laroche, MD  Reason for Admission: Admitting Diagnosis:  Acute pulmonary edema (CMS-HCC) [J81.0]  Acute respiratory failure with hypoxia (CMS-HCC) [J96.01]  Past Medical History:   has a past medical history of Acute kidney injury superimposed on chronic kidney disease (CMS-HCC) (10/11/2020), Acute on chronic diastolic (congestive) heart failure (CMS-HCC) (08/23/2020), Acute on chronic diastolic congestive heart failure (CMS-HCC), AKI (acute kidney injury) (CMS-HCC) (04/14/2015), Anemia (08/22/2019), Arthritis, Calculus of kidney, Calculus of ureter, CHF (congestive heart failure) (CMS-HCC), Chronic atrial fibrillation (CMS-HCC) (07/20/2019), COPD (chronic obstructive pulmonary disease) (CMS-HCC), Diabetes (CMS-HCC), Gangrenous cholecystitis (10/11/2020), Generalized edema  (06/17/2021), GERD (gastroesophageal reflux disease), Hydronephrosis, Hypertension, Hyponatremia (10/11/2020), Intermediate coronary syndrome (CMS-HCC) (03/13/2014), Lower extremity edema (09/28/2020), Lumbar stenosis, Microscopic hematuria, Nausea alone, Nephrolithiasis (04/17/2016), Neuropathy, Nocturia, Nocturia (07/01/2017), Other chronic cystitis, 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  Past Surgical History:   has a past surgical history that includes Carpal tunnel release (Left, 2014); Hysterectomy (1971); Back surgery (1995); Lumbar disc surgery; IR Insert Cholecystosmy Tube Percutaneous (10/02/2020); pr removal gallbladder (N/A, 10/06/2020); pr right heart cath o2 saturation & cardiac output (N/A, 09/30/2020); and pr right heart cath o2 saturation & cardiac output (N/A, 01/09/2021).   Previous admit date: 08/31/2023    Primary Insurance- Payor: HUMANA MEDICARE ADV / Plan: HUMANA GOLD PLUS HMO / Product Type: *No Product type* /   Secondary Insurance - None  Preferred Pharmacy - [facility] [place] - [address]  [id number] - [place], [place] - [address] AT [facility]  [facility] - [place], [place] - [address]    Transportation home: Private vehicle    Social Determinants of Health  Social Determinants of Health     Food Insecurity: No Food Insecurity (08/20/2023)    Hunger Vital Sign     Worried About Running Out of Food in the Last Year: Never true     Ran Out of Food in the Last Year: Never true   Internet Connectivity: Not on file   Housing/Utilities: Low Risk  (08/20/2023)    Housing/Utilities     Within the past 12 months, have you ever stayed: outside, in a car, in a tent, in an overnight shelter, or temporarily in someone else's home (i.e. couch-surfing)?: No     Are you worried about losing your housing?: No  Within the past 12 months, have you been unable to get utilities (heat, electricity) when it was really needed?: No   Tobacco Use: Medium Risk (09/28/2023)    Patient History     Smoking Tobacco Use: Former     Smokeless Tobacco Use: Never     Passive Exposure: Not on file   Transportation Needs: No Transportation Needs (08/20/2023)    PRAPARE - Therapist, art (Medical): No     Lack of Transportation (Non-Medical): No   Alcohol Use: Not At Risk (03/18/2023)    Alcohol Use     How often do you have a drink containing alcohol?: Never     How many drinks containing alcohol do you have on a typical day when you are drinking?: 1 - 2     How often do you have 5 or more drinks on one occasion?: Never   Interpersonal Safety: Unknown (09/29/2023)    Interpersonal Safety     Unsafe Where You Currently Live: Not on file     Physically Hurt by Anyone: Not on file     Abused by Anyone: Not on file   Physical Activity: Unknown (11/04/2017)    Received from [facility], [facility]    Exercise Vital Sign     Days of Exercise per Week: Patient declined     Minutes of Exercise per Session: Patient declined   Intimate Partner Violence: Not At Risk (12/17/2021)    Humiliation, Afraid, Rape, and Kick questionnaire     Fear of Current or Ex-Partner: No     Emotionally Abused: No     Physically Abused: No     Sexually Abused: No   Stress: Unknown (11/04/2017)    Received from [facility], [facility]    [employer] of Occupational Health - Occupational Stress Questionnaire     Feeling of Stress : Patient declined   Substance Use: Low Risk  (06/17/2023)    Substance Use     Taken prescription drugs for non-medical reasons: Never     Taken illegal drugs: Never     Patient indicated they have taken drugs in the past year for non-medical reasons: Yes, [positive answer(s)]: Not on file   Social Connections: Unknown (11/04/2017)    Received from [facility], [facility]    Social Connection and Isolation Panel [NHANES]     Frequency of Communication with Friends and Family: Patient declined     Frequency of Social Gatherings with Friends and Family: Patient declined     Attends Religious Services: Patient declined     Database administrator or Organizations: Patient declined     Attends Banker Meetings: Patient declined     Marital Status: Patient declined   Programmer, applications: Low Risk  (08/20/2023)    Overall Financial Resource Strain (CARDIA)     Difficulty of Paying Living Expenses: Not hard at all   Depression: Not at risk (03/18/2023)    PHQ-2     PHQ-2 Score: 0   Health Literacy: Medium Risk (05/02/2021)    Health Literacy     : Rarely     Discharge Needs Assessment  Concerns to be Addressed: discharge planning    Clinical Risk Factors: > 65, Principal Diagnosis: Cancer, Stroke, COPD, Heart Failure, AMI, Pneumonia, Joint Replacment, Multiple Diagnoses (Chronic), Readmission < 30 Days    Barriers to taking medications: No  Prior overnight hospital stay or [name] in last 90 days: No      Anticipated Changes Related to Illness: none    Equipment Needed After Discharge: other (see comments) (TBD)    Discharge Facility/Level of Care Needs: other (see comments) (TBD)    Readmission  Risk of Unplanned Readmission Score: UNPLANNED READMISSION SCORE: 22.33%  Predictive Model Details          22%  Factor Value    Calculated 09/29/2023 12:03 19% Number of active inpatient medication orders 31    [place] Risk of Unplanned Readmission Model 15% Number of ED visits in last six months 3     13% Number of hospitalizations in last year 3     8% ECG/EKG order present in last 6 months     7% Latest BUN high (94 mg/dL)     6% Imaging order present in last 6 months     6% Age 23     4% Current length of stay 4.842 days     4% Charlson Comorbidity Index 4     4% Diagnosis of deficiency anemia present     4% Active anticoagulant inpatient medication order present     4% Latest creatinine high (2.53 mg/dL)     3% Diagnosis of renal failure present     2% Future appointment scheduled     1% Active ulcer inpatient medication order present      Readmitted Within the Last 30 Days? (No if blank) Yes  Patient at risk for readmission?: Yes    Discharge Plan  Screen findings are: Discharge planning needs identified or anticipated (Comment). (continue to follow for needs)    Expected Discharge Date: 10/01/2023    Expected Transfer from Critical Care: 09/29/23    Quality data for continuing care services shared with patient and/or representative?: Yes  Patient and/or family were provided with choice of facilities / services that are available and appropriate to meet post hospital care needs?: Yes   List choices in order highest to lowest preferred, if applicable. : On service with [facility] and prefers to continue services    Initial Assessment complete?: Yes

## 2023-09-29 NOTE — Unmapped (Signed)
Pt currently stepdown status on CICU awaiting bed placement. A&O x's 4, VSS on 4LNC. Transitions self from home CPAP to  intermittently throughout the night. 2 BM's during shift, foley remains in place with decreased output.   Turning q2 in bed. Daughter remains at bedside.  Problem: Adult Inpatient Plan of Care  Goal: Plan of Care Review  Outcome: Progressing  Goal: Patient-Specific Goal (Individualized)  Outcome: Progressing  Goal: Absence of Hospital-Acquired Illness or Injury  Outcome: Progressing  Intervention: Identify and Manage Fall Risk  Recent Flowsheet Documentation  Taken 09/28/2023 2000 by Luci Bellucci, [place] A, RN  Safety Interventions:   aspiration precautions   lighting adjusted for tasks/safety   low bed   family at bedside   fall reduction program maintained  Intervention: Prevent Skin Injury  Recent Flowsheet Documentation  Taken 09/29/2023 0400 by Larson Limones, [place] A, RN  Positioning for Skin: Right  Skin Protection:   tubing/devices free from skin contact   adhesive use limited  Taken 09/29/2023 0200 by Javeah Loeza, [place] A, RN  Positioning for Skin: Left  Skin Protection:   tubing/devices free from skin contact   adhesive use limited  Taken 09/29/2023 0000 by Santiago Stenzel, [place] A, RN  Positioning for Skin: Right  Taken 09/28/2023 2200 by Sanna Porcaro, [place] A, RN  Positioning for Skin: Left  Skin Protection:   tubing/devices free from skin contact   adhesive use limited  Taken 09/28/2023 2000 by Lorilei Horan, [place] A, RN  Positioning for Skin: Right  Device Skin Pressure Protection:   absorbent pad utilized/changed   tubing/devices free from skin contact  Skin Protection:   tubing/devices free from skin contact   adhesive use limited  Intervention: Prevent and Manage VTE (Venous Thromboembolism) Risk  Recent Flowsheet Documentation  Taken 09/29/2023 0400 by Kierre Hintz, [place] A, RN  Anti-Embolism Intervention: (see MAR) Other (Comment)  Taken 09/29/2023 0200 by Kylian Loh, [place] A, RN  Anti-Embolism Intervention: (see MAR) Other (Comment)  Taken 09/29/2023 0000 by Bader Stubblefield, [place] A, RN  Anti-Embolism Intervention: (see MAR) Other (Comment)  Taken 09/28/2023 2200 by Rhylei Mcquaig, [place] A, RN  Anti-Embolism Intervention: (see MAR) Other (Comment)  Taken 09/28/2023 2000 by Dvid Pendry, [place] A, RN  Anti-Embolism Intervention: (see MAR) Other (Comment)  Intervention: Prevent Infection  Recent Flowsheet Documentation  Taken 09/28/2023 2000 by Fatina Sprankle, [place] A, RN  Infection Prevention:   cohorting utilized   environmental surveillance performed   equipment surfaces disinfected   hand hygiene promoted   personal protective equipment utilized   rest/sleep promoted   single patient room provided   visitors restricted/screened  Goal: Optimal Comfort and Wellbeing  Outcome: Progressing  Goal: Readiness for Transition of Care  Outcome: Progressing  Goal: Rounds/Family Conference  Outcome: Progressing     Problem: Fluid Volume Excess  Goal: Fluid Balance  Outcome: Progressing  Intervention: Monitor and Manage Hypervolemia  Recent Flowsheet Documentation  Taken 09/29/2023 0400 by Beuford Garcilazo, [place] A, RN  Skin Protection:   tubing/devices free from skin contact   adhesive use limited  Taken 09/29/2023 0200 by Elward Nocera, [place] A, RN  Skin Protection:   tubing/devices free from skin contact   adhesive use limited  Taken 09/28/2023 2200 by Kindel Rochefort, [place] A, RN  Skin Protection:   tubing/devices free from skin contact   adhesive use limited  Taken 09/28/2023 2000 by Rashon Westrup, [place] A, RN  Skin Protection:   tubing/devices free from skin contact   adhesive use limited     Problem: Fall Injury Risk  Goal: Absence of Fall and Fall-Related  Injury  Outcome: Progressing  Intervention: Promote Injury-Free Environment  Recent Flowsheet Documentation  Taken 09/28/2023 2000 by Chenae Brager, [place] A, RN  Safety Interventions:   aspiration precautions   lighting adjusted for tasks/safety   low bed   family at bedside   fall reduction program maintained     Problem: Gas Exchange Impaired  Goal: Optimal Gas Exchange  Outcome: Progressing  Intervention: Optimize Oxygenation and Ventilation  Recent Flowsheet Documentation  Taken 09/29/2023 0400 by Cace Osorto, [place] A, RN  Head of Bed [facility]) Positioning: HOB at 30-45 degrees  Taken 09/29/2023 0000 by Ayaansh Smail, [place] A, RN  Head of Bed [facility]) Positioning: HOB at 30-45 degrees  Taken 09/28/2023 2000 by Michiel Sivley, [place] A, RN  Head of Bed [facility]) Positioning: HOB at 30-45 degrees     Problem: Noninvasive Ventilation Acute  Goal: Effective Unassisted Ventilation and Oxygenation  Outcome: Progressing     Problem: Heart Failure  Goal: Optimal Coping  Outcome: Progressing  Goal: Optimal Cardiac Output  Outcome: Progressing  Goal: Stable Heart Rate and Rhythm  Outcome: Progressing  Goal: Optimal Functional Ability  Outcome: Progressing  Intervention: Optimize Functional Ability  Recent Flowsheet Documentation  Taken 09/29/2023 0400 by Tonesha Tsou, [place] A, RN  Activity Management: bedrest  Taken 09/29/2023 0200 by Jackolyn Geron, [place] A, RN  Activity Management: bedrest  Taken 09/28/2023 2200 by Perri Lamagna, [place] A, RN  Activity Management: bedrest  Taken 09/28/2023 2000 by Vernona Peake, [place] A, RN  Activity Management: bedrest  Goal: Fluid and Electrolyte Balance  Outcome: Progressing  Goal: Improved Oral Intake  Outcome: Progressing  Goal: Effective Oxygenation and Ventilation  Outcome: Progressing  Intervention: Promote Airway Secretion Clearance  Recent Flowsheet Documentation  Taken 09/29/2023 0400 by Felina Tello, [place] A, RN  Activity Management: bedrest  Taken 09/29/2023 0200 by Rosia Syme, [place] A, RN  Activity Management: bedrest  Taken 09/28/2023 2200 by Deadrick Stidd, [place] A, RN  Activity Management: bedrest  Taken 09/28/2023 2000 by Henrik Orihuela, [place] A, RN  Activity Management: bedrest  Intervention: Optimize Oxygenation and Ventilation  Recent Flowsheet Documentation  Taken 09/29/2023 0400 by Phi Avans, [place] A, RN  Head of Bed [facility]) Positioning: HOB at 30-45 degrees  Taken 09/29/2023 0000 by Celese Banner, [place] A, RN  Head of Bed [facility]) Positioning: HOB at 30-45 degrees  Taken 09/28/2023 2000 by Jiayi Lengacher, [place] A, RN  Head of Bed [facility]) Positioning: HOB at 30-45 degrees  Goal: Effective Breathing Pattern During Sleep  Outcome: Progressing     Problem: Skin Injury Risk Increased  Goal: Skin Health and Integrity  Outcome: Progressing  Intervention: Optimize Skin Protection  Recent Flowsheet Documentation  Taken 09/29/2023 0400 by Jaykub Mackins, [place] A, RN  Activity Management: bedrest  Pressure Reduction Techniques: weight shift assistance provided  Head of Bed (HOB) Positioning: HOB at 30-45 degrees  Pressure Reduction Devices: pressure-redistributing mattress utilized  Skin Protection:   tubing/devices free from skin contact   adhesive use limited  Taken 09/29/2023 0200 by Seneca Gadbois, [place] A, RN  Activity Management: bedrest  Pressure Reduction Techniques: weight shift assistance provided  Pressure Reduction Devices: pressure-redistributing mattress utilized  Skin Protection:   tubing/devices free from skin contact   adhesive use limited  Taken 09/29/2023 0000 by Seamus Warehime, [place] A, RN  Pressure Reduction Techniques: weight shift assistance provided  Head of Bed (HOB) Positioning: HOB at 30-45 degrees  Pressure Reduction Devices: pressure-redistributing mattress utilized  Taken 09/28/2023 2200 by Shilo Philipson, [place] A, RN  Activity Management: bedrest  Pressure Reduction Techniques: weight shift assistance provided  Pressure Reduction Devices: pressure-redistributing  mattress utilized  Skin Protection:   tubing/devices free from skin contact   adhesive use limited  Taken 09/28/2023 2000 by Javonn Gauger, [place] A, RN  Activity Management: bedrest  Pressure Reduction Techniques: weight shift assistance provided  Head of Bed (HOB) Positioning: HOB at 30-45 degrees  Pressure Reduction Devices: pressure-redistributing mattress utilized  Skin Protection:   tubing/devices free from skin contact   adhesive use limited

## 2023-09-30 LAB — URINALYSIS WITH MICROSCOPY
BILIRUBIN UA: NEGATIVE
GLUCOSE UA: NEGATIVE
HYALINE CASTS: 28 /LPF — ABNORMAL HIGH (ref 0–1)
KETONES UA: NEGATIVE
NITRITE UA: NEGATIVE
PH UA: 5 (ref 5.0–9.0)
PROTEIN UA: NEGATIVE
RBC UA: 1 /HPF (ref <=4)
SPECIFIC GRAVITY UA: 1.01 (ref 1.003–1.030)
SQUAMOUS EPITHELIAL: 2 /HPF (ref 0–5)
UROBILINOGEN UA: <2
WBC UA: 40 /HPF — ABNORMAL HIGH (ref 0–5)

## 2023-09-30 LAB — BASIC METABOLIC PANEL
ANION GAP: 11 mmol/L (ref 5–14)
BLOOD UREA NITROGEN: 84 mg/dL — ABNORMAL HIGH (ref 9–23)
BUN / CREAT RATIO: 38
CALCIUM: 9.6 mg/dL (ref 8.7–10.4)
CHLORIDE: 97 mmol/L — ABNORMAL LOW (ref 98–107)
CO2: 28 mmol/L (ref 20.0–31.0)
CREATININE: 2.21 mg/dL — ABNORMAL HIGH
EGFR CKD-EPI (2021) FEMALE: 22 mL/min/{1.73_m2} — ABNORMAL LOW (ref >=60)
GLUCOSE RANDOM: 158 mg/dL (ref 70–179)
POTASSIUM: 3.6 mmol/L (ref 3.4–4.8)
SODIUM: 136 mmol/L (ref 135–145)

## 2023-09-30 LAB — CBC
HEMATOCRIT: 41 % (ref 34.0–44.0)
HEMOGLOBIN: 13.7 g/dL (ref 11.3–14.9)
MEAN CORPUSCULAR HEMOGLOBIN CONC: 33.4 g/dL (ref 32.0–36.0)
MEAN CORPUSCULAR HEMOGLOBIN: 30.8 pg (ref 25.9–32.4)
MEAN CORPUSCULAR VOLUME: 92.1 fL (ref 77.6–95.7)
MEAN PLATELET VOLUME: 9.5 fL (ref 6.8–10.7)
PLATELET COUNT: 104 10*9/L — ABNORMAL LOW (ref 150–450)
RED BLOOD CELL COUNT: 4.45 10*12/L (ref 3.95–5.13)
RED CELL DISTRIBUTION WIDTH: 15 % (ref 12.2–15.2)
WBC ADJUSTED: 11.3 10*9/L — ABNORMAL HIGH (ref 3.6–11.2)

## 2023-09-30 MED ADMIN — amiodarone (PACERONE) tablet 200 mg: 200 mg | ORAL | @ 14:00:00

## 2023-09-30 MED ADMIN — fluticasone furoate-vilanterol (BREO ELLIPTA) 100-25 mcg/dose inhaler 1 puff: 1 | RESPIRATORY_TRACT | @ 14:00:00

## 2023-09-30 MED ADMIN — gabapentin (NEURONTIN) capsule 100 mg: 100 mg | ORAL

## 2023-09-30 MED ADMIN — umeclidinium (INCRUSE ELLIPTA) 62.5 mcg/actuation inhaler 1 puff: 1 | RESPIRATORY_TRACT | @ 14:00:00

## 2023-09-30 MED ADMIN — midodrine (PROAMATINE) tablet 5 mg: 5 mg | ORAL | @ 22:00:00

## 2023-09-30 MED ADMIN — apixaban (ELIQUIS) tablet 2.5 mg: 2.5 mg | ORAL

## 2023-09-30 MED ADMIN — lidocaine (ASPERCREME) 4 % 1 patch: 1 | TRANSDERMAL | @ 14:00:00 | Stop: 2023-10-03

## 2023-09-30 MED ADMIN — pantoprazole (Protonix) EC tablet 20 mg: 20 mg | ORAL | @ 14:00:00

## 2023-09-30 MED ADMIN — midodrine (PROAMATINE) tablet 5 mg: 5 mg | ORAL | @ 14:00:00

## 2023-09-30 MED ADMIN — montelukast (SINGULAIR) tablet 10 mg: 10 mg | ORAL | @ 14:00:00

## 2023-09-30 MED ADMIN — gabapentin (NEURONTIN) capsule 100 mg: 100 mg | ORAL | @ 14:00:00

## 2023-09-30 MED ADMIN — midodrine (PROAMATINE) tablet 5 mg: 5 mg | ORAL | @ 17:00:00

## 2023-09-30 MED ADMIN — tafamidis cap 61 mg **PATIENT-SUPPLIED**: 61 mg | ORAL | @ 14:00:00

## 2023-09-30 MED ADMIN — vitamin A-3,000 mcg RAE (10,000 UNIT) capsule 3,000 mcg of RAE: 3000 ug | ORAL | @ 14:00:00

## 2023-09-30 MED ADMIN — torsemide (DEMADEX) tablet 60 mg: 60 mg | ORAL | @ 17:00:00 | Stop: 2023-09-30

## 2023-09-30 MED ADMIN — apixaban (ELIQUIS) tablet 2.5 mg: 2.5 mg | ORAL | @ 14:00:00

## 2023-09-30 NOTE — Unmapped (Addendum)
Patient is alert & oriented. Vital signs checked. Due meds given. Bladder scan done. On tele monitoring. Oxygen 2 L by nasal canula. Bed in low position with call bell within reach. Pt transferred to 3 [facility]. Report given to RN 3 [facility].    Problem: Adult Inpatient Plan of Care  Goal: Plan of Care Review  Outcome: Progressing  Goal: Patient-Specific Goal (Individualized)  Outcome: Progressing  Goal: Absence of Hospital-Acquired Illness or Injury  Outcome: Progressing  Intervention: Identify and Manage Fall Risk  Recent Flowsheet Documentation  Taken 09/30/2023 0800 by Lyndee Leo, RN  Safety Interventions:   fall reduction program maintained   lighting adjusted for tasks/safety   low bed  Goal: Optimal Comfort and Wellbeing  Outcome: Progressing  Goal: Readiness for Transition of Care  Outcome: Progressing  Goal: Rounds/Family Conference  Outcome: Progressing     Problem: Fluid Volume Excess  Goal: Fluid Balance  Outcome: Progressing     Problem: Fall Injury Risk  Goal: Absence of Fall and Fall-Related Injury  Outcome: Progressing  Intervention: Promote Scientist, clinical (histocompatibility and immunogenetics) Documentation  Taken 09/30/2023 0800 by Lyndee Leo, RN  Safety Interventions:   fall reduction program maintained   lighting adjusted for tasks/safety   low bed     Problem: Self-Care Deficit  Goal: Improved Ability to Complete Activities of Daily Living  Outcome: Progressing     Problem: Gas Exchange Impaired  Goal: Optimal Gas Exchange  Outcome: Progressing     Problem: Noninvasive Ventilation Acute  Goal: Effective Unassisted Ventilation and Oxygenation  Outcome: Progressing     Problem: Heart Failure  Goal: Optimal Coping  Outcome: Progressing  Goal: Optimal Cardiac Output  Outcome: Progressing  Goal: Stable Heart Rate and Rhythm  Outcome: Progressing  Goal: Optimal Functional Ability  Outcome: Progressing  Goal: Fluid and Electrolyte Balance  Outcome: Progressing  Goal: Improved Oral Intake  Outcome: Progressing  Goal: Effective Oxygenation and Ventilation  Outcome: Progressing  Goal: Effective Breathing Pattern During Sleep  Outcome: Progressing     Problem: Skin Injury Risk Increased  Goal: Skin Health and Integrity  Outcome: Progressing

## 2023-09-30 NOTE — Unmapped (Signed)
Advanced Heart Failure/Transplant/LVAD (MDD) Cardiology Progress Note    Patient Name: Barbara Huber  MRN: [id number]  Date of Admission: 09/24/2023  Date of Service: 09/30/2023    Reason for Admission:  Barbara Huber is a 82 y.o. female pertinent PMH of [name], cardiac amyloidosis, pulmonary hypertension, chronic respiratory failure on 3 L, COPD, [name] stage IV, atrial fibrillation, DM II that presents to the ED with acute on chronic heart failure exacerbation        Assessment and Plan:     Acute on Chronic [name] Exacerbation - TTR Cardiac Amyloidosis   Patient follows with cardiology outpatient in the cardiac amyloid clinic and has been seen in pulm HTN clinic as well. Home GDMT includes Torsemide 60 mg daily, spironolactone 25mg  and PRN metolazone. Jardiance stopped due to UTIs and she had issues with hypotension with ARB and BB. For her hATTR amyloid she is on Tafamidis as well as started Amvuttra 25mg  q42mos in April 2024 given neuropathy leading to falls. She was admitted directly from diuresis clinic and initially underwent inotrope assisted diuresis with dopamine and furosemide gtt, which was stopped on 9/30 after episodes of hypotension requiring NE. Norepinephrine stopped and pulmonary wedge pressure was 12 prior to pulling swan on on 10/1. Furosemide gtt transitioned to spot IV lasix and she received lasix 120 mg IV x1 on 10/1. On 10/2, she was transitioned to home torsemide 60mg  daily in the evening after confirming she was not orthostatic earlier in the day.   - Continue torsemide 60 mg daily  - Strict Is/Os; daily weights  - Continue midodrine 5 mg TID, hold if BP >140/90  - Continue home tafamidis 61 mg daily  - Revisit home spironolactone, held during admission given AKI    Leukocytosis/thrombocytopenia  Mild leukocytosis noted today (10/3). No symptoms of infection other than frequent urination but also receiving diuretic. Will obtain urinalysis to r/o UTI. She has a h/o recurrent UTI, reports long term use of fosfomycin. She missed her dose 4 days ago (Monday 9/30). Platelets lower than baseline on admission (144), prior to heparin subcutaneous use for DVT ppx. Nadir at 81 but trending up today.   [ ]  urinalaysis  -daily CBC    Hx of Persistent Atrial Fibrillation   CHA2DS2-VASc score = 5 (1-gender, 2-Age, 1-DM, 1-HTN). Is on chronic amiodarone 200 mg daily for atrial fibrillation. She was on eliquis in the past but stopped after episodes of anemia and blood in the stool. Never hospitalized for such. Patient at increased risk of stroke given A. Fib and amyloidosis so restarted on Eliquis.   - Continue Amiodarone 200 mg daily   - Continue Eliquis 2.5 mg BID      Chronic Kidney Disease stage V  AKI   Follows with nephrology in the outpatient setting with baseline Cr 2.5-3. Creatinine on admission was 5.34. Renal function improved in the setting of ongoing diuresis. Nephrology consulted and do not see signs of amyloid activity in her kidneys given absence of significant proteinuria. AKI likely secondary to congestion. Recommend to continue diuresis. Lasix gtt stopped on 9/30 and transitioned to spot dose lasix as needed. PWP 12 on 10/1 prior to swan removal. Last dose IV lasix 10/1 and transitioned to home torsemide 60mg  daily on the evening of 10/2.  - Continue home torsemide 60mg  daily today  - Avoid hypotension and nephrotoxins  - Renally dose all meds    Chronic Respiratory Failure - COPD - Pulmonary HTN - OSA  On 3L home O2 at  baseline. Pt presented with O2 sats in the 80s requiring BiPap in the ED. Now on home 3 L with good saturations. Followed with [facility] pulm in the past and picture c/w mild obstructive pattern.   - Breo Ellipta inhaler 1 puff daily  - Continue home Singulair 10mg  daily   - PRN albuterol  - Home CPAP     Chronic problems    Chronic Pain: Followed by the outpatient pain clinic [facility] in [place]. Takes OxyContin 12 mg 2 times a day. Held oxycontin in setting of somnolence. Has oxycodone 5 mg q4hr PRN and lidocaine patch instead.  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 Last Amvuttra injection July 2024. 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 A1C 5.8 on 08/30/23   Recurrent UTI: Reports fosfomycin 3g weekly since 2022. Followed by Dr. Lenard Galloway, last phone conversation about refills but insurance was denying. Plans to discuss continuation but cannot locate the visits. Currently held on admission.     Checklist  VTE: Eliquis 2.5 mg BID  GI: home protonix  Code Status: Full Code  Dispo: Floor. Possible d/c 10/4 if renal function stable/good urine output without significant retention.       Barbara Must, PA    ---------------------------------------------------------------------------------------------------------------------       Interval History/Subjective:     Did not have a great night. She was urinating all night. Her torsemide was given around 6pm.  No dysuria, abd pain, fever, chills, or nausea.    She also has chronic back pain and is hard to get comfortable. Pain is not different. Requesting to restart her lidocaine patch. Her home oxycontin had been held d/t somnolence. She has not requested prn oxycodone.     Some swelling in legs but no abd fullness, SOB, or dizziness.    Daughter has been at bedside. Hopeful they can go home tomorrow. They feel comfortable they have enough support between herself (on FMLA), husband (works nights), and nearby family to support Barbara Huber with her transfers and ADLs. She feels she is almost near her baseline.     Notes she usually takes fosfomycin every Monday to prevent UTIs.      Objective:     Medications:   amiodarone  200 mg Oral Daily    apixaban  2.5 mg Oral BID    flu vacc [id number] 6mos up(PF)  0.5 mL Intramuscular During hospitalization    fluticasone furoate-vilanterol  1 puff Inhalation Daily (RT)    gabapentin  100 mg Oral BID    lidocaine  1 patch Transdermal Daily    midodrine  5 mg Oral TID    montelukast  10 mg Oral Daily    pantoprazole  20 mg Oral Daily    polyethylene glycol  17 g Oral BID    senna  1 tablet Oral Nightly    tafamidis  61 mg Oral Daily    torsemide  60 mg Oral Once    umeclidinium  1 puff Inhalation Daily (RT)    vitamin A-3,000 mcg RAE (10,000 UNIT)  3,000 mcg of RAE Oral Daily       acetaminophen, albuterol, oxyCODONE, sodium chloride    Physical Examination:  Temp:  [36.4 ??C (97.5 ??F)-36.9 ??C (98.4 ??F)] 36.4 ??C (97.5 ??F)  Heart Rate:  [79-118] 118  SpO2 Pulse:  [91] 91  Resp:  [10-25] 18  BP: (96-153)/(44-116) 115/77  MAP (mmHg):  [61-120] 91  SpO2:  [93 %-99 %] 96 %  Oxygen Therapy        Date/Time Resp SpO2 O2 Device FiO2 (%) O2 Flow Rate (L/min)    09/30/23 0854 18  96 %  Nasal cannula  --  --                   Height: 165.1 cm (5' 5)  Body mass index is 33.97 kg/m??.  Wt Readings from Last 3 Encounters:   09/30/23 92.6 kg (204 lb 2.3 oz)   09/24/23 94.3 kg (208 lb)   09/23/23 93.9 kg (207 lb)       General: NAD  HEENT: benign   Neck: No JVD   Lungs: CTA B/L, [place] in place, no tachypnea  CV: irregular rhythm, not tachycardic, no m/g/r     Abd: Soft, NT, ND  Ext: 1+ leg edema to mid shin bilaterally  Neuro:  Nonfocal      Intake/Output Summary (Last 24 hours) at 09/30/2023 1151  Last data filed at 09/30/2023 0945  Gross per 24 hour   Intake 320 ml   Output 1516 ml   Net -1196 ml     I/O last 3 completed shifts:  In: 1120 [P.O.:930; I.V.:190]  Out: 2051 [Urine:2051]  I/O         10/01 0701  10/02 0700 10/02 0701  10/03 0700 10/03 0701  10/04 0700    P.O. 750 450     I.V. (mL/kg) 314.4 (3.4) 70 (0.8)     IV Piggyback       Total Intake 1064.4 520     Urine (mL/kg/hr) 1660 (0.8) 1416 (0.6) 300 (0.7)    Emesis/NG output 0      Stool 0 0     Total Output(mL/kg) 1660 (18.1) 1416 (15.3) 300 (3.2)    Net -595.6 -896 -300           Urine Occurrence  1 x     Stool Occurrence 4 x 2 x     Emesis Occurrence 0 x               No results for input(s): O2SATVEN in the last 24 hours.      Labs & Imaging:  Reviewed in EPIC.   Lab Results   Component Value Date    WBC 11.3 (H) 09/30/2023    HGB 13.7 09/30/2023    HCT 41.0 09/30/2023    PLT 104 (L) 09/30/2023     Lab Results   Component Value Date    NA 136 09/30/2023    K 3.6 09/30/2023    CL 97 (L) 09/30/2023    CO2 28.0 09/30/2023    BUN 84 (H) 09/30/2023    CREATININE 2.21 (H) 09/30/2023    GLU 158 09/30/2023    CALCIUM 9.6 09/30/2023    MG 2.6 09/29/2023    PHOS 4.5 08/31/2023     Lab Results   Component Value Date    BILITOT 1.3 (H) 09/29/2023    BILIDIR 0.70 (H) 09/29/2023    PROT 6.6 09/29/2023    ALBUMIN 3.0 (L) 09/29/2023    ALT 8 (L) 09/29/2023    AST 22 09/29/2023    ALKPHOS 120 (H) 09/29/2023    GGT 215 (H) 09/29/2020     Lab Results   Component Value Date  INR 1.04 09/24/2023    APTT 46.9 (H) 01/10/2021       Lab Results   Component Value Date    BNP 450.68 (H) 08/30/2023    BNP 324.32 (H) 08/27/2023    PRO-BNP 17,737.0 (H) 09/29/2023    LDH 632 (H) 06/03/2021

## 2023-09-30 NOTE — Unmapped (Signed)
Adult Nutrition Assessment Note    Visit Type: RN Consult  Reason for Visit: Per Admission Nutrition Screen (Adult) - for Unsure unintentional weight loss      NUTRITION INTERVENTIONS and RECOMMENDATION     Continue current diet as appropriate. Consider liberalizing diet restriction if restriction is limiting patient's PO intake.   Encourage and monitor PO intake and record % consumed in Epic.  Trial Magic Cup 1x/day (vanilla, chocolate).   Consider daily multivitamin if PO intake <50%.   Weekly weights.     NUTRITION ASSESSMENT     Current nutrition therapy is appropriate and progressing toward meeting meeting nutritional needs at this time.   Patient would benefit from start of oral supplement to better meet nutritional needs.    NUTRITIONALLY RELEVANT DATA     HPI & PMH:  Barbara Huber is a 82 y.o. female with pertinent PMH of [name], cardiac amyloidosis, pulmonary hypertension, chronic respiratory failure on 3 L, COPD, [name] stage IV, atrial fibrillation, DM II who to the ED with acute on chronic heart failure exacerbation.     Nutrition History:   September 30, 2023: Prior to admission: Information obtained from patient and family. Patient reports having a decreased appetite and eating less for ~1 month PTA. Family endorses patient consuming 100% of breakfast and 50% of dinner plus occasional snacks per day during this time compared to her 2 full meals plus more frequent snacks per day. Endorses taking miralax for constipation on oxycodone. Does not endorse nausea, vomiting, abdominal pain, or chewing/swallowing difficulty. During admission: Patient reports having a decreased appetite and not eating a lot. However, family endorses patient eating well yesterday: breakfast- 100% scrambled eggs, toast, 2 sausages, 50% oatmeal; lunch- most of [place] & gravy, 50% mashed potatoes; dinner- 100% grilled chicken. Documented usual meal intakes of 75-100% (one 50%). Patient states she had diarrhea previously after consuming drinkable oral nutrition supplements like Ensure but since she tolerates regular ice cream without issue she is willing to try Magic Cup.     Anthropometric Data:  Height: 165.1 cm (5' 5)   Admission weight: 92.9 kg (204 lb 12.8 oz)  Last recorded weight: 92.6 kg (204 lb 2.3 oz)  IBW: 56.75 kg  Percent IBW:    BMI: Body mass index is 33.97 kg/m??.   Usual Body Weight:  210-217 lbs    Weight history prior to admission: -8 lbs (3.8%) in ~1 month per patient's family - not significant.   Wt Readings from Last 10 Encounters:   09/30/23 92.6 kg (204 lb 2.3 oz)   09/24/23 94.3 kg (208 lb)   09/23/23 93.9 kg (207 lb)   09/23/23 94 kg (207 lb 3.2 oz)   09/22/23 92.1 kg (203 lb)   09/06/23 93 kg (205 lb)   09/02/23 92.6 kg (204 lb 1.6 oz)   08/27/23 92.7 kg (204 lb 4.8 oz)   08/23/23 93.9 kg (207 lb 1.6 oz)   07/14/23 95.7 kg (211 lb)        Weight changes this admission:   Last 5 Recorded Weights    09/26/23 0600 09/28/23 0400 09/29/23 0400 09/29/23 2241   Weight: 92.9 kg (204 lb 12.8 oz) 91.1 kg (200 lb 13.4 oz) 91.9 kg (202 lb 9.6 oz) 94.6 kg (208 lb 8.9 oz)    09/30/23 0632   Weight: 92.6 kg (204 lb 2.3 oz)        Nutritional Needs:   Healthy balance of carbohydrate, protein, and fat.  Malnutrition assessment not yet completed at this time due to inability to complete nutrition focused physical exam (NFPE).    Unable to complete at this time due to patient's fatigue level, falling asleep    Care plan:No, unable to diagnose malnutrition at this time     Current Nutrition:  Oral intake   Nutrition Orders            Nutrition Therapy Regular/House; Sodium Restricted (2 gm Na+); Fluid 2000 ml starting at 09/30 1040            Medications:   Nutritionally pertinent medications reviewed and evaluated for potential food and/or medication interactions. Senokot, miralax, protonix, Vitamin A    Labs:   Nutritionally pertinent labs reviewed.     Allergies, Intolerances, Sensitivities, and/or Cultural/Religious Dietary Restrictions: none identified per chart review at this time     GOALS and EVALUATION     Patient to meet 75% or greater of nutritional needs via combination of meals, snacks, and/or oral supplements within admission.  - New    Motivation, Barriers, and Compliance:  Evaluation of motivation, barriers, and compliance completed. No concerns identified at this time.     Discharge Planning:   Monitor for potential discharge needs with multi-disciplinary team.     Follow-Up Parameters:   1-2 times per week (and more frequent as indicated)    Tobie Lords, MPH, RD, LDN  Pager # [phone]

## 2023-09-30 NOTE — Unmapped (Addendum)
Patient received from [facility] in stable condition,on 2 L oxygen via [place],passed TOV,input and out put monitored,family at bed side,fall precaution in place,call bell within reach.  Problem: Adult Inpatient Plan of Care  Goal: Plan of Care Review  Outcome: Progressing  Goal: Patient-Specific Goal (Individualized)  Outcome: Progressing  Goal: Absence of Hospital-Acquired Illness or Injury  Outcome: Progressing  Goal: Optimal Comfort and Wellbeing  Outcome: Progressing  Goal: Readiness for Transition of Care  Outcome: Progressing  Goal: Rounds/Family Conference  Outcome: Progressing     Problem: Fluid Volume Excess  Goal: Fluid Balance  Outcome: Progressing     Problem: Fall Injury Risk  Goal: Absence of Fall and Fall-Related Injury  Outcome: Progressing     Problem: Self-Care Deficit  Goal: Improved Ability to Complete Activities of Daily Living  Outcome: Progressing     Problem: Gas Exchange Impaired  Goal: Optimal Gas Exchange  Outcome: Progressing     Problem: Noninvasive Ventilation Acute  Goal: Effective Unassisted Ventilation and Oxygenation  Outcome: Progressing

## 2023-09-30 NOTE — Unmapped (Signed)
Pt floor status here for HF exacerbation. See FS for VS, I/Os. See MAR for meds given. Family at bedside. Pt free from falls. Pt transferred to 3 west around 2230. Provider notified of urinary retention, bladder scans under 400cc thus far.     Problem: Adult Inpatient Plan of Care  Goal: Plan of Care Review  Outcome: Progressing  Goal: Patient-Specific Goal (Individualized)  Outcome: Progressing  Goal: Absence of Hospital-Acquired Illness or Injury  Outcome: Progressing  Intervention: Identify and Manage Fall Risk  Recent Flowsheet Documentation  Taken 09/29/2023 2000 by Hilton Sinclair, RN  Safety Interventions:   aspiration precautions   bleeding precautions   commode/urinal/bedpan at bedside   fall reduction program maintained   lighting adjusted for tasks/safety   low bed   nonskid shoes/slippers when out of bed  Intervention: Prevent Skin Injury  Recent Flowsheet Documentation  Taken 09/29/2023 2000 by Hilton Sinclair, RN  Positioning for Skin: Supine/Back  Device Skin Pressure Protection:   absorbent pad utilized/changed   adhesive use limited  Skin Protection:   adhesive use limited   cleansing with dimethicone incontinence wipes   incontinence pads utilized   transparent dressing maintained   tubing/devices free from skin contact  Intervention: Prevent Infection  Recent Flowsheet Documentation  Taken 09/29/2023 2000 by Hilton Sinclair, RN  Infection Prevention: rest/sleep promoted  Goal: Optimal Comfort and Wellbeing  Outcome: Progressing  Goal: Readiness for Transition of Care  Outcome: Progressing  Goal: Rounds/Family Conference  Outcome: Progressing     Problem: Fluid Volume Excess  Goal: Fluid Balance  Outcome: Progressing  Intervention: Monitor and Manage Hypervolemia  Recent Flowsheet Documentation  Taken 09/29/2023 2000 by Hilton Sinclair, RN  Skin Protection:   adhesive use limited   cleansing with dimethicone incontinence wipes   incontinence pads utilized   transparent dressing maintained tubing/devices free from skin contact     Problem: Fall Injury Risk  Goal: Absence of Fall and Fall-Related Injury  Outcome: Progressing  Intervention: Promote Scientist, clinical (histocompatibility and immunogenetics) Documentation  Taken 09/29/2023 2000 by Hilton Sinclair, RN  Safety Interventions:   aspiration precautions   bleeding precautions   commode/urinal/bedpan at bedside   fall reduction program maintained   lighting adjusted for tasks/safety   low bed   nonskid shoes/slippers when out of bed     Problem: Self-Care Deficit  Goal: Improved Ability to Complete Activities of Daily Living  Outcome: Progressing     Problem: Gas Exchange Impaired  Goal: Optimal Gas Exchange  Outcome: Progressing  Intervention: Optimize Oxygenation and Ventilation  Recent Flowsheet Documentation  Taken 09/29/2023 2000 by Hilton Sinclair, RN  Head of Bed [facility]) Positioning: HOB at 30-45 degrees  Taken 09/29/2023 1945 by Hilton Sinclair, RN  Head of Bed [facility]) Positioning: HOB at 45 degrees     Problem: Noninvasive Ventilation Acute  Goal: Effective Unassisted Ventilation and Oxygenation  Outcome: Progressing     Problem: Heart Failure  Goal: Optimal Coping  Outcome: Progressing  Goal: Optimal Cardiac Output  Outcome: Progressing  Goal: Stable Heart Rate and Rhythm  Outcome: Progressing  Goal: Optimal Functional Ability  Outcome: Progressing  Goal: Fluid and Electrolyte Balance  Outcome: Progressing  Goal: Improved Oral Intake  Outcome: Progressing  Goal: Effective Oxygenation and Ventilation  Outcome: Progressing  Intervention: Optimize Oxygenation and Ventilation  Recent Flowsheet Documentation  Taken 09/29/2023 2000 by Hilton Sinclair, RN  Head of Bed [facility]) Positioning: HOB at 30-45 degrees  Taken 09/29/2023 1945  by Hilton Sinclair, RN  Head of Bed [facility]) Positioning: HOB at 45 degrees  Goal: Effective Breathing Pattern During Sleep  Outcome: Progressing     Problem: Skin Injury Risk Increased  Goal: Skin Health and Integrity  Outcome: Progressing  Intervention: Optimize Skin Protection  Recent Flowsheet Documentation  Taken 09/29/2023 2000 by Hilton Sinclair, RN  Pressure Reduction Techniques:   frequent weight shift encouraged   heels elevated off bed  Head of Bed (HOB) Positioning: HOB at 30-45 degrees  Pressure Reduction Devices: pressure-redistributing mattress utilized  Skin Protection:   adhesive use limited   cleansing with dimethicone incontinence wipes   incontinence pads utilized   transparent dressing maintained   tubing/devices free from skin contact  Taken 09/29/2023 1945 by Hilton Sinclair, RN  Head of Bed [facility]) Positioning: HOB at 45 degrees

## 2023-09-30 NOTE — Unmapped (Signed)
Patient remains with floor status. Patient sit up in chair for 4 hrs today and tolerating well today. Patient orthostatic blood pressure taken and charted. Please see the flowsheets. Patient blood pressure has been stable during shift. Patient is alert and oriented with 3L . Patient can move out of bed to chair and bedside commode with walker and 2 ppl assistant. Patient foley was removed @ 13:30 today and tolerating well. Patient had BM X1 today. Patient denied pain today. All needs met. Updated plan of care and verbalized understanding.       Problem: Adult Inpatient Plan of Care  Goal: Plan of Care Review  Outcome: Progressing  Goal: Patient-Specific Goal (Individualized)  Outcome: Progressing  Goal: Absence of Hospital-Acquired Illness or Injury  Outcome: Progressing  Intervention: Identify and Manage Fall Risk  Recent Flowsheet Documentation  Taken 09/29/2023 0800 by Loleta Rose, RN  Safety Interventions:   aspiration precautions   bleeding precautions   fall reduction program maintained   lighting adjusted for tasks/safety   low bed   nonskid shoes/slippers when out of bed  Intervention: Prevent Skin Injury  Recent Flowsheet Documentation  Taken 09/29/2023 1800 by Loleta Rose, RN  Positioning for Skin: Supine/Back  Taken 09/29/2023 1600 by Loleta Rose, RN  Positioning for Skin: Sitting in Chair  Taken 09/29/2023 1400 by Loleta Rose, RN  Positioning for Skin: Right  Taken 09/29/2023 1200 by Loleta Rose, RN  Positioning for Skin: Supine/Back  Device Skin Pressure Protection:   absorbent pad utilized/changed   tubing/devices free from skin contact  Taken 09/29/2023 1000 by Loleta Rose, RN  Positioning for Skin: Bed in Chair  Skin Protection: tubing/devices free from skin contact  Taken 09/29/2023 0800 by Loleta Rose, RN  Positioning for Skin: Right  Skin Protection: tubing/devices free from skin contact  Intervention: Prevent Infection  Recent Flowsheet Documentation  Taken 09/29/2023 0800 by Loleta Rose, RN  Infection Prevention:   single patient room provided   personal protective equipment utilized   equipment surfaces disinfected  Goal: Optimal Comfort and Wellbeing  Outcome: Progressing  Goal: Readiness for Transition of Care  Outcome: Progressing  Goal: Rounds/Family Conference  Outcome: Progressing     Problem: Fluid Volume Excess  Goal: Fluid Balance  Outcome: Progressing  Intervention: Monitor and Manage Hypervolemia  Recent Flowsheet Documentation  Taken 09/29/2023 1000 by Loleta Rose, RN  Skin Protection: tubing/devices free from skin contact  Taken 09/29/2023 0800 by Loleta Rose, RN  Skin Protection: tubing/devices free from skin contact     Problem: Fall Injury Risk  Goal: Absence of Fall and Fall-Related Injury  Outcome: Progressing  Intervention: Promote Scientist, clinical (histocompatibility and immunogenetics) Documentation  Taken 09/29/2023 0800 by Loleta Rose, RN  Safety Interventions:   aspiration precautions   bleeding precautions   fall reduction program maintained   lighting adjusted for tasks/safety   low bed   nonskid shoes/slippers when out of bed     Problem: Self-Care Deficit  Goal: Improved Ability to Complete Activities of Daily Living  Outcome: Progressing     Problem: Gas Exchange Impaired  Goal: Optimal Gas Exchange  Outcome: Progressing  Intervention: Optimize Oxygenation and Ventilation  Recent Flowsheet Documentation  Taken 09/29/2023 1800 by Loleta Rose, RN  Head of Bed [facility]) Positioning: HOB at 30-45 degrees  Taken 09/29/2023 1400 by Loleta Rose, RN  Head of Bed [facility]) Positioning: HOB at 30-45 degrees  Taken 09/29/2023 1200 by Loleta Rose, RN  Head of Bed [facility]) Positioning: HOB at 30-45 degrees  Taken 09/29/2023 1000 by Loleta Rose, RN  Head of Bed [facility]) Positioning: HOB at 60-90 degrees  Taken 09/29/2023 0800 by Loleta Rose, RN  Head of Bed [facility]) Positioning: HOB at 20-30 degrees     Problem: Noninvasive Ventilation Acute  Goal: Effective Unassisted Ventilation and Oxygenation  Outcome: Progressing Problem: Heart Failure  Goal: Optimal Coping  Outcome: Progressing  Goal: Optimal Cardiac Output  Outcome: Progressing  Goal: Stable Heart Rate and Rhythm  Outcome: Progressing  Goal: Optimal Functional Ability  Outcome: Progressing  Intervention: Optimize Functional Ability  Recent Flowsheet Documentation  Taken 09/29/2023 1800 by Loleta Rose, RN  Activity Management:   back to bed   bedrest  Taken 09/29/2023 1600 by Loleta Rose, RN  Activity Management: up in chair  Taken 09/29/2023 1400 by Loleta Rose, RN  Activity Management: bedrest  Taken 09/29/2023 1200 by Loleta Rose, RN  Activity Management: bedrest  Taken 09/29/2023 1000 by Loleta Rose, RN  Activity Management: sitting, edge of bed  Taken 09/29/2023 0800 by Loleta Rose, RN  Activity Management: bedrest  Goal: Fluid and Electrolyte Balance  Outcome: Progressing  Goal: Improved Oral Intake  Outcome: Progressing  Goal: Effective Oxygenation and Ventilation  Outcome: Progressing  Intervention: Promote Airway Secretion Clearance  Recent Flowsheet Documentation  Taken 09/29/2023 1800 by Loleta Rose, RN  Activity Management:   back to bed   bedrest  Taken 09/29/2023 1600 by Loleta Rose, RN  Activity Management: up in chair  Taken 09/29/2023 1400 by Loleta Rose, RN  Activity Management: bedrest  Taken 09/29/2023 1200 by Loleta Rose, RN  Activity Management: bedrest  Taken 09/29/2023 1000 by Loleta Rose, RN  Activity Management: sitting, edge of bed  Taken 09/29/2023 0800 by Loleta Rose, RN  Activity Management: bedrest  Intervention: Optimize Oxygenation and Ventilation  Recent Flowsheet Documentation  Taken 09/29/2023 1800 by Loleta Rose, RN  Head of Bed [facility]) Positioning: HOB at 30-45 degrees  Taken 09/29/2023 1400 by Loleta Rose, RN  Head of Bed [facility]) Positioning: HOB at 30-45 degrees  Taken 09/29/2023 1200 by Loleta Rose, RN  Head of Bed [facility]) Positioning: HOB at 30-45 degrees  Taken 09/29/2023 1000 by Loleta Rose, RN  Head of Bed [facility]) Positioning: HOB at 60-90 degrees  Taken 09/29/2023 0800 by Loleta Rose, RN  Head of Bed (HOB) Positioning: HOB at 20-30 degrees  Goal: Effective Breathing Pattern During Sleep  Outcome: Progressing     Problem: Skin Injury Risk Increased  Goal: Skin Health and Integrity  Outcome: Progressing  Intervention: Optimize Skin Protection  Recent Flowsheet Documentation  Taken 09/29/2023 1800 by Loleta Rose, RN  Activity Management:   back to bed   bedrest  Head of Bed [facility]) Positioning: HOB at 30-45 degrees  Taken 09/29/2023 1600 by Loleta Rose, RN  Activity Management: up in chair  Taken 09/29/2023 1400 by Loleta Rose, RN  Activity Management: bedrest  Head of Bed [facility]) Positioning: HOB at 30-45 degrees  Taken 09/29/2023 1200 by Loleta Rose, RN  Activity Management: bedrest  Head of Bed [facility]) Positioning: HOB at 30-45 degrees  Taken 09/29/2023 1000 by Loleta Rose, RN  Activity Management: sitting, edge of bed  Head of Bed [facility]) Positioning: HOB at 60-90 degrees  Skin Protection: tubing/devices free from skin contact  Taken 09/29/2023 0800 by Loleta Rose, RN  Activity Management: bedrest  Pressure Reduction Techniques: frequent weight shift encouraged  Head of Bed (HOB) Positioning: HOB at 20-30 degrees  Pressure Reduction Devices: pressure-redistributing mattress utilized  Skin Protection: tubing/devices  free from skin contact

## 2023-10-01 DIAGNOSIS — N184 Chronic kidney disease, stage 4 (severe): Principal | ICD-10-CM

## 2023-10-01 DIAGNOSIS — I5023 Acute on chronic systolic (congestive) heart failure: Principal | ICD-10-CM

## 2023-10-01 DIAGNOSIS — Z515 Encounter for palliative care: Principal | ICD-10-CM

## 2023-10-01 LAB — BASIC METABOLIC PANEL
ANION GAP: 13 mmol/L (ref 5–14)
BLOOD UREA NITROGEN: 94 mg/dL — ABNORMAL HIGH (ref 9–23)
BUN / CREAT RATIO: 32
CALCIUM: 9.8 mg/dL (ref 8.7–10.4)
CHLORIDE: 93 mmol/L — ABNORMAL LOW (ref 98–107)
CO2: 26 mmol/L (ref 20.0–31.0)
CREATININE: 2.93 mg/dL — ABNORMAL HIGH
EGFR CKD-EPI (2021) FEMALE: 16 mL/min/{1.73_m2} — ABNORMAL LOW (ref >=60)
GLUCOSE RANDOM: 259 mg/dL — ABNORMAL HIGH (ref 70–179)
POTASSIUM: 3.6 mmol/L (ref 3.4–4.8)
SODIUM: 132 mmol/L — ABNORMAL LOW (ref 135–145)

## 2023-10-01 LAB — CBC
HEMATOCRIT: 41.9 % (ref 34.0–44.0)
HEMOGLOBIN: 13.6 g/dL (ref 11.3–14.9)
MEAN CORPUSCULAR HEMOGLOBIN CONC: 32.5 g/dL (ref 32.0–36.0)
MEAN CORPUSCULAR HEMOGLOBIN: 30.3 pg (ref 25.9–32.4)
MEAN CORPUSCULAR VOLUME: 93.3 fL (ref 77.6–95.7)
MEAN PLATELET VOLUME: 9.7 fL (ref 6.8–10.7)
PLATELET COUNT: 126 10*9/L — ABNORMAL LOW (ref 150–450)
RED BLOOD CELL COUNT: 4.49 10*12/L (ref 3.95–5.13)
RED CELL DISTRIBUTION WIDTH: 15.1 % (ref 12.2–15.2)
WBC ADJUSTED: 13.4 10*9/L — ABNORMAL HIGH (ref 3.6–11.2)

## 2023-10-01 LAB — MAGNESIUM: MAGNESIUM: 2.3 mg/dL (ref 1.6–2.6)

## 2023-10-01 MED ORDER — OXYCODONE 5 MG TABLET
ORAL_TABLET | Freq: Three times a day (TID) | ORAL | 0 refills | 5 days | Status: CP | PRN
Start: 2023-10-01 — End: 2023-10-06
  Filled 2023-10-01: qty 15, 5d supply, fill #0

## 2023-10-01 MED ORDER — APIXABAN 2.5 MG TABLET
ORAL_TABLET | Freq: Two times a day (BID) | ORAL | 2 refills | 30 days | Status: CP
Start: 2023-10-01 — End: ?
  Filled 2023-10-01: qty 60, 30d supply, fill #0

## 2023-10-01 MED ORDER — MIDODRINE 5 MG TABLET
ORAL_TABLET | Freq: Three times a day (TID) | ORAL | 2 refills | 30 days | Status: CP
Start: 2023-10-01 — End: ?
  Filled 2023-10-01: qty 90, 30d supply, fill #0

## 2023-10-01 MED ORDER — HYDROXYZINE HCL 10 MG TABLET
ORAL_TABLET | Freq: Four times a day (QID) | ORAL | 2 refills | 8 days | Status: CP | PRN
Start: 2023-10-01 — End: ?
  Filled 2023-10-01: qty 30, 8d supply, fill #0

## 2023-10-01 MED ADMIN — acetaminophen (TYLENOL) tablet 1,000 mg: 1000 mg | ORAL | @ 13:00:00 | Stop: 2023-10-01

## 2023-10-01 MED ADMIN — amiodarone (PACERONE) tablet 200 mg: 200 mg | ORAL | @ 13:00:00 | Stop: 2023-10-01

## 2023-10-01 MED ADMIN — hydrOXYzine (ATARAX) tablet 10 mg: 10 mg | ORAL | @ 07:00:00 | Stop: 2023-10-01

## 2023-10-01 MED ADMIN — midodrine (PROAMATINE) tablet 5 mg: 5 mg | ORAL | @ 18:00:00 | Stop: 2023-10-01

## 2023-10-01 MED ADMIN — pantoprazole (Protonix) EC tablet 20 mg: 20 mg | ORAL | @ 13:00:00 | Stop: 2023-10-01

## 2023-10-01 MED ADMIN — tafamidis cap 61 mg **PATIENT-SUPPLIED**: 61 mg | ORAL | @ 13:00:00 | Stop: 2023-10-01

## 2023-10-01 MED ADMIN — torsemide (DEMADEX) tablet 60 mg: 60 mg | ORAL | @ 15:00:00 | Stop: 2023-10-01

## 2023-10-01 MED ADMIN — oxyCODONE (ROXICODONE) immediate release tablet 5 mg: 5 mg | ORAL | @ 05:00:00 | Stop: 2023-10-01

## 2023-10-01 MED ADMIN — fosfomycin (MONUROL) packet 3 g: 3 g | ORAL | @ 18:00:00 | Stop: 2023-10-01

## 2023-10-01 MED ADMIN — vitamin A-3,000 mcg RAE (10,000 UNIT) capsule 3,000 mcg of RAE: 3000 ug | ORAL | @ 15:00:00 | Stop: 2023-10-01

## 2023-10-01 MED ADMIN — lidocaine (ASPERCREME) 4 % 1 patch: 1 | TRANSDERMAL | @ 13:00:00 | Stop: 2023-10-01

## 2023-10-01 MED ADMIN — umeclidinium (INCRUSE ELLIPTA) 62.5 mcg/actuation inhaler 1 puff: 1 | RESPIRATORY_TRACT | @ 13:00:00 | Stop: 2023-10-01

## 2023-10-01 MED ADMIN — fluticasone furoate-vilanterol (BREO ELLIPTA) 100-25 mcg/dose inhaler 1 puff: 1 | RESPIRATORY_TRACT | @ 13:00:00 | Stop: 2023-10-01

## 2023-10-01 MED ADMIN — gabapentin (NEURONTIN) capsule 100 mg: 100 mg | ORAL

## 2023-10-01 MED ADMIN — oxyCODONE (OxyCONTIN) 12 hr crush resistant ER/CR tablet 10 mg: 10 mg | ORAL | @ 15:00:00 | Stop: 2023-10-01

## 2023-10-01 MED ADMIN — apixaban (ELIQUIS) tablet 2.5 mg: 2.5 mg | ORAL

## 2023-10-01 MED ADMIN — montelukast (SINGULAIR) tablet 10 mg: 10 mg | ORAL | @ 13:00:00 | Stop: 2023-10-01

## 2023-10-01 MED ADMIN — midodrine (PROAMATINE) tablet 5 mg: 5 mg | ORAL | @ 13:00:00 | Stop: 2023-10-01

## 2023-10-01 MED ADMIN — apixaban (ELIQUIS) tablet 2.5 mg: 2.5 mg | ORAL | @ 13:00:00 | Stop: 2023-10-01

## 2023-10-01 MED ADMIN — gabapentin (NEURONTIN) capsule 100 mg: 100 mg | ORAL | @ 13:00:00 | Stop: 2023-10-01

## 2023-10-01 MED FILL — FOSFOMYCIN TROMETHAMINE 3 GRAM ORAL PACKET: ORAL | 1 days supply | Qty: 3 | Fill #0

## 2023-10-01 NOTE — Unmapped (Signed)
[facility] Discharge Summary    Identifying Information:   Barbara Huber  11-11-1941  [id number]    Admit date: 09/24/2023    Discharge date: 10/02/2023     Discharge Service: Heart Failure (MDD)    Discharge Attending Physician: Priscille Loveless III, MD    Discharge to: Home with Home Health and/or PT/OT    Discharge Diagnoses:  Principal Diagnosis:  [name], Acute on Chronic    Secondary Diagnoses:  TTR Cardiac amyloidosis  Chronic Atrial Fibrillation  Chronic respiratory failure  Pulmonary HTN  COPD  OSA  [name], Stage 5  Recurrent UTI  Neuropathy/Chronic pain  Frailty    Hospital Course:   Barbara Huber is a 82 y.o. female pertinent PMH of [name], cardiac amyloidosis, pulmonary hypertension, chronic respiratory failure on 3 L, COPD, [name] stage IV, atrial fibrillation, DM II that presents to the ED with acute on chronic heart failure exacerbation     Acute on Chronic [name] Exacerbation - TTR Cardiac Amyloidosis:   Patient follows with cardiology outpatient in the cardiac amyloid clinic and has been seen in pulm HTN clinic as well. Home GDMT includes Torsemide 60 mg daily, spironolactone 25mg  and PRN metolazone. Jardiance stopped due to UTIs and she had issues with hypotension with ARB and BB. Spironolactone was discontinued on admission given AKI. For her hATTR amyloid she is on Tafamidis as well as started Amvuttra 25mg  q42mos in April 2024 given neuropathy leading to falls. She was admitted directly from diuresis clinic and initially underwent inotrope assisted diuresis with dopamine and furosemide gtt, which was stopped on 9/30 after episodes of hypotension requiring NE and midodrine. Norepinephrine stopped and pulmonary wedge pressure was 12 prior to pulling swan on on 10/1. Midodrine maintained to maximize preload. Furosemide gtt transitioned to spot IV lasix and then was transitioned to home torsemide 60mg  daily on 10/2. Palliative care was consulted and family decided on outpatient evaluation. She was discharged with a pro-BNP of 17K down from 26K and weight of 209lb. Discharged with new home regimen torsemide 60mg  daily, metolazone prn per clinic, midodrine 5mg  TID, Tafamidis 61mg  daily.     Hx of Persistent Atrial Fibrillation:  She was continued on chronic home amiodarone 200 mg daily for atrial fibrillation. She was on eliquis in the past but stopped after episodes of anemia and blood in the stool. Never hospitalized for such. Patient at increased risk of stroke given A. Fib and amyloidosis so restarted on Eliquis 2.5mg  BID. CHA2DS2-VASc score = 5 (1-gender, 2-Age, 1-DM, 1-HTN).    Chronic Kidney Disease stage V with AKI: Follows with nephrology in the outpatient setting. Baseline Cr 2.5-3. Creatinine on admission was 5.34.   Renal function improved with diuresis. Nephrology consulted and felt low concern for any amyloid activity in her kidneys given absence of significant proteinuria. Instead they believe this likely secondary to congestion and agreed with ongoing diuresis. Her Cr at discharge was 2.9.    Chronic Respiratory Failure - COPD - Pulmonary HTN - OSA  On 3L home O2 at baseline. Pt presented with O2 sats in the 80s requiring BiPap in the ED. Was able to be weaned back to home 3 L with good saturations after diuresis. She was continued on Breo Ellipta inhaler 1 puff daily, home Singulair 10mg  daily, PRN albuterol and home CPAP was offered but declined.     Chronic Pain: Followed by the outpatient pain clinic [facility] in [place]. Takes OxyContin 12 mg 2 times a day. Held oxycontin in setting of somnolence.  She was discharged on oxycodone 5 mg q4hr PRN and lidocaine patch instead. She also like hydroxyzine as it helped her relax and was discharged with 10mg  dose. Recommended follow up with pain clinic.    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 Last Amvuttra injection July 2024. Vitamin A-3000 mcg RAE daily in setting of Amvuttra prescription    Frailty  PT/OT consulted and recommended AIR but family and patient preferred trial with home PT/OT as they have multiple adult family members in or near the house with DME equipment at home. She was able to ambulate with rollator but requires assistance with transfers.    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 Continued on home crestor at discharge.    DM II: Not currently on treatment. A1C 5.8 on 08/30/23     Recurrent UTI: Reports fosfomycin 3g weekly since 2022. Followed by Dr. Lenard Galloway, last phone conversation about refills but insurance was denying. Daughter reports they decided to continue this prophylactic treatment. During this admission she developed leukocytosis without prominent UTI symptoms but had urinalysis with leukocyte esterase. Urine culture was sent. She was given one dose of fosfomycin 3g on 10/4 with plans to continue routine dosing on 10/7.    Outpatient Follow Up Issues:   [ ]  Urine culture results  [ ]  Reassess CBC and BMP next week  [ ]  HF clinic appointment on 10/9    Procedures:  No admission procedures for hospital encounter.  ______________________________________________________________________    Discharge Day Services:  Pt seen on the day of discharge and determined appropriate for discharge.  BP 117/70  - Pulse 90  - Temp 36.8 ??C (98.2 ??F) (Oral)  - Resp 18  - Ht 165.1 cm (5' 5)  - Wt 95 kg (209 lb 7 oz)  - SpO2 95%  - BMI 34.85 kg/m??     Admission wt = Weight: 92.9 kg (204 lb 12.8 oz)  Last wt = Weight: 95 kg (209 lb 7 oz)  Last 30 Recorded Weights    09/26/23 0600 09/28/23 0400 09/29/23 0400 09/29/23 2241   Weight: 92.9 kg (204 lb 12.8 oz) 91.1 kg (200 lb 13.4 oz) 91.9 kg (202 lb 9.6 oz) 94.6 kg (208 lb 8.9 oz)    09/30/23 0632 09/30/23 2018   Weight: 92.6 kg (204 lb 2.3 oz) 95 kg (209 lb 7 oz)       Exam stable with clear lungs, no JVD, RRR, nondistended/nontender abd with +BS, no pedal edema, nonfocal neuro exam.        Condition at Discharge: stable  ______________________________________________________________________  Discharge Medications:     Your Medication List        STOP taking these medications      NON FORMULARY     OxyCONTIN 10 mg Tr12 12 hr crush resistant ER/CR tablet  Generic drug: oxyCODONE  Replaced by: oxyCODONE 5 MG immediate release tablet     spironolactone 25 MG tablet  Commonly known as: ALDACTONE            START taking these medications      ELIQUIS 2.5 mg Tab  Generic drug: apixaban  Take 1 tablet (2.5 mg total) by mouth two (2) times a day.     fosfomycin 3 gram Pack  Commonly known as: MONUROL  Take 3 g (1 packet) by mouth once for 1 dose. On 10/07/23 if directed by clinic to take. Pending urine culture  results.  Start taking on: October 07, 2023     hydrOXYzine 10 MG tablet  Commonly known as: ATARAX  Take 1 tablet (10 mg total) by mouth every six (6) hours as needed for anxiety.     LIDOCAINE PAIN RELIEF 4 % patch  Generic drug: lidocaine  Place 1 patch on the skin daily. Remove after 12 hours.     midodrine 5 MG tablet  Commonly known as: PROAMATINE  Take 1 tablet (5 mg total) by mouth three (3) times a day.     oxyCODONE 5 MG immediate release tablet  Commonly known as: ROXICODONE  Take 1 tablet (5 mg total) by mouth every eight (8) hours as needed for pain (moderate to severe pain).  Replaces: OxyCONTIN 10 mg Tr12 12 hr crush resistant ER/CR tablet            CHANGE how you take these medications      acetaminophen 500 MG tablet  Commonly known as: TYLENOL  Take 2 tablets (1,000 mg total) by mouth Three (3) times a day as needed for pain.  What changed:   when to take this  reasons to take this            CONTINUE taking these medications      albuterol 90 mcg/actuation inhaler  Commonly known as: PROVENTIL HFA;VENTOLIN HFA  Inhale 2 puffs every eight (8) hours as needed for wheezing.     amiodarone 200 MG tablet  Commonly known as: PACERONE  TAKE ONE TABLET BY MOUTH ONCE DAILY AMVUTTRA 25 mg/0.5 mL injection  Generic drug: vutrisiran  Inject 0.5 mL (25 mg total) under the skin once. Every 12 Weeks     cranberry 500 mg Cap  Take 500 mg by mouth daily with evening meal.     ergocalciferol-1,250 mcg (50,000 unit) 1,250 mcg (50,000 unit) capsule  Commonly known as: DRISDOL  Take 1 capsule (1,250 mcg total) by mouth once a week.     estradiol 0.01 % (0.1 mg/gram) vaginal cream  Commonly known as: ESTRACE  Insert 2 g into the vagina Two (2) times a week.     ferrous sulfate 325 (65 FE) MG EC tablet  Take 1 tablet (325 mg total) by mouth Every Monday, Wednesday, and Friday.     fluticasone propion-salmeterol 115-21 mcg/actuation inhaler  Commonly known as: ADVAIR HFA  Inhale 2 puffs two (2) times a day.     gabapentin 100 MG capsule  Commonly known as: NEURONTIN  Take 1 capsule (100 mg total) by mouth two (2) times a day.     metOLazone 5 MG tablet  Commonly known as: ZAROXOLYN  Take 1 tablet (5 mg total) by mouth daily as needed (up to 3x weekly when instructed by cardiology clinic).     montelukast 10 mg tablet  Commonly known as: SINGULAIR  Take 1 tablet (10 mg total) by mouth every morning.     NARCAN 4 mg/actuation nasal spray  Generic drug: naloxone  1 spray into alternating nostrils once as needed (opioid overdose). PRN - Emergency use.     OXYGEN-AIR DELIVERY SYSTEMS MISC  5 L by Miscellaneous route. Currently using 3  L/min via      pantoprazole 20 MG tablet  Commonly known as: Protonix  TAKE 1 TABLET(20 MG) BY MOUTH DAILY     rosuvastatin 5 MG tablet  Commonly known as: CRESTOR  TAKE 1 TABLET EVERY OTHER DAY     SPIRIVA RESPIMAT 2.5 mcg/actuation inhalation mist  Generic drug: tiotropium bromide  Inhale 2 puffs daily.     torsemide 20 MG tablet  Commonly known as: DEMADEX  Take 3 tablets (60 mg total) by mouth daily.     vitamin A-3,000 mcg RAE (10,000 UNIT) 3,000 mcg RAE (10,000 UNIT) capsule  Take 1 capsule (3,000 mcg of RAE total) by mouth daily.     VYNDAMAX 61 mg Cap  Generic drug: tafamidis  Take 1 capsule (61 mg) by mouth daily.              ______________________________________________________________________  Pending Test Results (if blank, then none):  Pending Labs       Order Current Status    Urine Culture In process            Most Recent Labs:  Recent Labs     Units 10/01/23  1032   NA mmol/L 132*   K mmol/L 3.6   CL mmol/L 93*   CO2 mmol/L 26.0   BUN mg/dL 94*   CREATININE mg/dL 5.78*   CALCIUM mg/dL 9.8   MG mg/dL 2.3     Recent Labs     Units 10/01/23  1032   WBC 10*9/L 13.4*   HGB g/dL 46.9   HCT % 62.9   PLT 10*9/L 126*     Lab Results   Component Value Date    ALKPHOS 120 (H) 09/29/2023    BILITOT 1.3 (H) 09/29/2023    BILIDIR 0.70 (H) 09/29/2023    PROT 6.6 09/29/2023    ALBUMIN 3.0 (L) 09/29/2023    ALT 8 (L) 09/29/2023    AST 22 09/29/2023    GGT 215 (H) 09/29/2020     Lab Results   Component Value Date    LDH 632 (H) 06/03/2021    INR 1.04 09/24/2023    INR 1.20 08/30/2023    INR 1.0 01/06/2011    PRO-BNP 17,737.0 (H) 09/29/2023    PRO-BNP 26,088.0 (H) 09/24/2023    BNP 450.68 (H) 08/30/2023    BNP 324.32 (H) 08/27/2023     Microbiology Results (last day)       Procedure Component Value Date/Time Date/Time    Urine Culture [[id number]] Collected: 10/01/23 1113    Lab Status: In process Specimen: Urine from Clean Catch Updated: 10/01/23 1132            Hospital Radiology:  XR Chest Portable    Result Date: 09/28/2023  EXAM: XR CHEST PORTABLE ACCESSION: [id number] UN CLINICAL INDICATION: Line placement. TECHNIQUE: Single View AP Chest Radiograph. COMPARISON: Previous day 12:36. FINDINGS: Swan-Ganz catheter right pulmonary artery. The lungs remain clear with no pleural fluid or pneumothorax. No other change.     Swan-Ganz catheter right pulmonary artery.    ECG 12 Lead    Result Date: 09/27/2023  SINUS RHYTHM WITH 1ST DEGREE AV BLOCK LEFT AXIS DEVIATION LEFT BUNDLE BRANCH BLOCK ABNORMAL ECG WHEN COMPARED WITH ECG OF 24-Sep-2023 19:13, SINUS RHYTHM HAS REPLACED WIDE QRS RHYTHM Confirmed by Pollyann Kennedy ([id number]) on 09/27/2023 10:26:49 PM    XR Chest Portable    Result Date: 09/27/2023  EXAM: XR CHEST PORTABLE ACCESSION: [id number] UN CLINICAL INDICATION: Line placement. TECHNIQUE: Single View AP Chest Radiograph. COMPARISON: Earlier today 8:06. FINDINGS: Swan-Ganz catheter again in the proximal left lower lobe pulmonary artery with no other change.     Minimal change since earlier today.    XR Chest Portable    Result Date: 09/27/2023  EXAM: XR CHEST PORTABLE ACCESSION: [id number] UN CLINICAL INDICATION: Fever. TECHNIQUE: Single View  AP Chest Radiograph. COMPARISON: Previous day 8:24. FINDINGS: Swan-Ganz catheter left lower lobe pulmonary artery with no new findings.     Swan-Ganz catheter again in the peripheral left lower lobe pulmonary artery.    XR Chest Portable    Result Date: 09/26/2023  EXAM: XR CHEST PORTABLE ACCESSION: [id number] UN CLINICAL INDICATION: LINE CHECK (CATHETER VASCULAR FIT)  TECHNIQUE: Single View AP Chest Radiograph. COMPARISON: Chest radiograph 09/17/2023 FINDINGS: Unchanged support devices. Mild vascular indistinctness is present. No pleural effusion or pneumothorax. Stable enlarged cardiac silhouette. Aorta is calcified and tortuous.     Resolving pulmonary edema.    XR Chest Portable    Result Date: 09/26/2023  EXAM: XR CHEST PORTABLE ACCESSION: [id number] UN CLINICAL INDICATION: LINE CHECK (CATHETER VASCULAR FIT)  TECHNIQUE: Single View AP Chest Radiograph. COMPARISON: Chest radiograph 09/24/2023. FINDINGS: Interval placement of pulmonary artery catheter with tip projecting over the expected location of the left pulmonary trunk. Unchanged pulmonary edema. Both costophrenic sulci are blunted. No pneumothorax. Cardiac silhouette is unchanged in size.     Interval placement of pulmonary artery catheter with tip projecting over the expected location of the distal left pulmonary artery, pointing towards the left lower lobe pulmonary artery. Unchanged pulmonary edema. ECG 12 Lead    Result Date: 09/24/2023  JUNCTIONAL RHYTHM WITH PREMATURE VENTRICULAR BEATS LEFT AXIS DEVIATION INTRAVENTRICULAR CONDUCTION DELAY INFERIOR INFARCT , AGE UNDETERMINED POSSIBLE ANTEROLATERAL INFARCT  , AGE UNDETERMINED ABNORMAL ECG WHEN COMPARED WITH ECG OF 06-Sep-2023 13:42, JUNCTIONAL RHYTHM HAS REPLACED SINUS RHYTHM QRS DURATION HAS INCREASED Reconfirmed by Eldred Manges [phone]) on 09/24/2023 8:33:45 PM    ECG 12 Lead    Result Date: 09/24/2023  WIDE QRS RHYTHM POSSIBLY JUNCTIONAL RHYTHM INTRAVENTRICULAR CONDUCTION DELAY INFERIOR INFARCT , AGE UNDETERMINED POSSIBLE ANTEROLATERAL INFARCT  , AGE UNDETERMINED ABNORMAL ECG WHEN COMPARED WITH ECG OF 24-Sep-2023 13:33, NO SIGNIFICANT CHANGE WAS FOUND Confirmed by Eldred Manges ([id number]) on 09/24/2023 8:31:35 PM    ECG 12 Lead    Result Date: 09/24/2023  WIDE QRS RHYTHM POSSIBLY JUNCTIONAL RHYTHM LEFT AXIS DEVIATION INTRAVENTRICULAR CONDUCTION DELAY INFERIOR INFARCT , AGE UNDETERMINED POSSIBLE ANTEROLATERAL INFARCT  , AGE UNDETERMINED ABNORMAL ECG WHEN COMPARED WITH ECG OF 24-Sep-2023 15:42, NO SIGNIFICANT CHANGE WAS FOUND Confirmed by Eldred Manges ([id number]) on 09/24/2023 8:31:03 PM    XR Chest Portable    Result Date: 09/24/2023  EXAM: XR CHEST PORTABLE ACCESSION: [id number] UN CLINICAL INDICATION: SHORTNESS OF BREATH  TECHNIQUE: Single View AP Chest Radiograph. COMPARISON: 09/23/2023 chest radiograph and prior FINDINGS: No consolidative airspace disease. Persistent but improved pulmonary edema. Query small pleural effusions. No pneumothorax. Stable enlarged cardiac silhouette.     Minimal change in portable imaging of chest relative to similar imaging from 1 day prior. Notably, there is similar enlargement of the cardiac silhouette and persistent, but improved, pulmonary edema. Small effusions may contribute to opacification of the costophrenic angles.      ______________________________________________________________________    Discharge Plan and Instructions: Follow Up instructions and Outpatient Referrals     Ambulatory Referral to Home Health      Reason for referral: [facility] RN/PT/OT    Physician to follow patient's care: PCP    Disciplines requested:  Nursing  Physical Therapy  Occupational Therapy       Nursing requested: Teaching/skilled observation and assessment    What teaching is needed (new diagnosis? new medications?): Medications    Physical Therapy requested:  Evaluate and treat  Strengthening exercises  Home safety evaluation       Occupational Therapy Requested:  Home safety  evaluation  Evaluate and treat  Strengthening exercises       Call MD for:  difficulty breathing, headache or visual disturbances      Call MD for:  extreme fatigue      Call MD for:  persistent dizziness or light-headedness      Call MD for:  temperature >38.5 Celsius      Discharge instructions          Appointments:  Appointments which have been scheduled for you      Oct 06, 2023 1:30 PM  (Arrive by 1:15 PM)  RETURN HEART FAILURE [place] with Montez Hageman, AGNP  [facility] CARDIOLOGY [place] [facility] REGION) [address]  [facility] 1 through 4  [place] [address]  [phone]   Wear comfortable walking shoes in the event that a 6-minute walk test must be performed         Oct 06, 2023 2:45 PM  (Arrive by 2:30 PM)  INFUSION ONLY with UNCTIF 10  [facility] THERAPEUTIC INFUSION CTR [place] [facility] REGION) [address]  [facility] 1 through 4  [place] [address]  [phone]        Nov 10, 2023 3:45 PM  (Arrive by 3:30 PM)  RETURN VIDEO MYCHART with Artelia Laroche, MD  [facility] INTERNAL MEDICINE [place] [facility] REGION) [address]  [facility] 1 through 4  [place] [address]  [phone]   Please sign into My [place] Chart at least 15 minutes before your appointment to complete the eCheck-In process. You must complete eCheck-In before you can start your video visit. We also recommend testing your audio and video connection to troubleshoot any issues before your visit begins. Click ???Join Video Visit??? to complete these checks. Once you have completed eCheck-In and tested your audio and video, click ???Join Call??? to connect to your visit.     For your video visit, you will need a computer with a working camera, speaker and microphone, a smartphone, or a tablet with internet access.    My [place] Chart enables you to manage your health, send non-urgent messages to your provider, view your test results, schedule and manage appointments, and request prescription refills securely and conveniently from your computer or mobile device.    You can go to [email or web address] to sign in to your My [place] Chart account with your username and password. If you have forgotten your username or password, please choose the ???Forgot Username???? and/or ???Forgot Password???? links to gain access. You also can access your My [place] Chart account with the free MyChart mobile app for Android or iPhone.    If you need assistance accessing your My [place] Chart account or for assistance in reaching your provider's office to reschedule or cancel your appointment, please call [facility] [phone].         Nov 11, 2023 3:30 PM  (Arrive by 3:15 PM)  RETURN NEPHROLOGY with Abhijit Lonia Blood, MD  [facility] KIDNEY SPECIALTY [facility]IC [place] REGION) [address]  [facility] 1 through 4  [place] [address]  [phone]               Length of Discharge: I spent greater than 30 mins in the discharge of this patient.      Daisey Must, PA

## 2023-10-01 NOTE — Unmapped (Signed)
Problem: Adult Inpatient Plan of Care  Goal: Plan of Care Review  Outcome: Progressing  Goal: Patient-Specific Goal (Individualized)  Outcome: Progressing  Goal: Absence of Hospital-Acquired Illness or Injury  Outcome: Progressing  Intervention: Identify and Manage Fall Risk  Recent Flowsheet Documentation  Taken 10/01/2023 0800 by Vern Claude, RN  Safety Interventions:   fall reduction program maintained   family at bedside   low bed  Intervention: Prevent Skin Injury  Recent Flowsheet Documentation  Taken 10/01/2023 0800 by Vern Claude, RN  Positioning for Skin: Supine/Back  Intervention: Prevent Infection  Recent Flowsheet Documentation  Taken 10/01/2023 0800 by Vern Claude, RN  Infection Prevention: hand hygiene promoted  Goal: Optimal Comfort and Wellbeing  Outcome: Progressing  Goal: Readiness for Transition of Care  Outcome: Progressing  Goal: Rounds/Family Conference  Outcome: Progressing     Problem: Fluid Volume Excess  Goal: Fluid Balance  Outcome: Progressing     Problem: Fall Injury Risk  Goal: Absence of Fall and Fall-Related Injury  Outcome: Progressing  Intervention: Promote Injury-Free Environment  Recent Flowsheet Documentation  Taken 10/01/2023 0800 by Vern Claude, RN  Safety Interventions:   fall reduction program maintained   family at bedside   low bed     Problem: Self-Care Deficit  Goal: Improved Ability to Complete Activities of Daily Living  Outcome: Progressing     Problem: Gas Exchange Impaired  Goal: Optimal Gas Exchange  Outcome: Progressing     Problem: Noninvasive Ventilation Acute  Goal: Effective Unassisted Ventilation and Oxygenation  Outcome: Progressing     Problem: Heart Failure  Goal: Optimal Coping  Outcome: Progressing  Goal: Optimal Cardiac Output  Outcome: Progressing  Goal: Stable Heart Rate and Rhythm  Outcome: Progressing  Goal: Optimal Functional Ability  Outcome: Progressing  Intervention: Optimize Functional Ability  Recent Flowsheet Documentation  Taken 10/01/2023 0800 by Vern Claude, RN  Activity Management: bedrest  Goal: Fluid and Electrolyte Balance  Outcome: Progressing  Goal: Improved Oral Intake  Outcome: Progressing  Goal: Effective Oxygenation and Ventilation  Outcome: Progressing  Intervention: Promote Airway Secretion Clearance  Recent Flowsheet Documentation  Taken 10/01/2023 0800 by Vern Claude, RN  Activity Management: bedrest  Goal: Effective Breathing Pattern During Sleep  Outcome: Progressing     Problem: Skin Injury Risk Increased  Goal: Skin Health and Integrity  Outcome: Progressing  Intervention: Optimize Skin Protection  Recent Flowsheet Documentation  Taken 10/01/2023 0800 by Vern Claude, RN  Activity Management: bedrest  Pressure Reduction Techniques: frequent weight shift encouraged  Pressure Reduction Devices: pressure-redistributing mattress utilized   A Fib with HR of 80s to 90s on monitor. Pain managed by PRN tylenol and scheduled OxyContin, with good effect. Diuresing with PO torsemide. AVS discussed with patient and family, verbally understand on discharge instruction and information. Discharge meds delivered to bedside by [facility] prior to discharge. No other discharge needs identified.

## 2023-10-02 MED ORDER — LIDOCAINE 4 % TOPICAL PATCH
MEDICATED_PATCH | Freq: Every day | TRANSDERMAL | 2 refills | 30 days | Status: CP
Start: 2023-10-02 — End: ?
  Filled 2023-10-01 – 2023-10-21 (×2): qty 10, 10d supply, fill #0

## 2023-10-04 DIAGNOSIS — Z09 Encounter for follow-up examination after completed treatment for conditions other than malignant neoplasm: Principal | ICD-10-CM

## 2023-10-04 DIAGNOSIS — N3000 Acute cystitis without hematuria: Principal | ICD-10-CM

## 2023-10-04 DIAGNOSIS — N390 Urinary tract infection, site not specified: Principal | ICD-10-CM

## 2023-10-04 DIAGNOSIS — I5032 Chronic diastolic (congestive) heart failure: Principal | ICD-10-CM

## 2023-10-04 NOTE — Unmapped (Signed)
We're out-of-network with this patient's Brewing technologist. For the patient???s benefit, we would recommend for this referral to be passed on to an in-network DME provider.

## 2023-10-04 NOTE — Unmapped (Signed)
Patient/daughter requested hospital bed for home use. I certify that the patient requires frequent or immediate changes to body position AND the patient requires the head of the bed to be elevated more than 30 degrees most of the time due to CHF. Order placed for [facility] home care.

## 2023-10-06 ENCOUNTER — Ambulatory Visit: Admit: 2023-10-06 | Discharge: 2023-10-07 | Payer: MEDICARE | Attending: Adult Health | Primary: Adult Health

## 2023-10-06 ENCOUNTER — Ambulatory Visit: Admit: 2023-10-06 | Discharge: 2023-10-07 | Payer: MEDICARE

## 2023-10-06 DIAGNOSIS — I43 Cardiomyopathy in diseases classified elsewhere: Principal | ICD-10-CM

## 2023-10-06 DIAGNOSIS — E854 Organ-limited amyloidosis: Principal | ICD-10-CM

## 2023-10-06 DIAGNOSIS — I4819 Other persistent atrial fibrillation: Principal | ICD-10-CM

## 2023-10-06 DIAGNOSIS — I5032 Chronic diastolic (congestive) heart failure: Principal | ICD-10-CM

## 2023-10-06 DIAGNOSIS — E852 Heredofamilial amyloidosis, unspecified: Principal | ICD-10-CM

## 2023-10-06 DIAGNOSIS — N184 Chronic kidney disease, stage 4 (severe): Principal | ICD-10-CM

## 2023-10-06 LAB — BASIC METABOLIC PANEL
ANION GAP: 11 mmol/L (ref 5–14)
BLOOD UREA NITROGEN: 110 mg/dL — ABNORMAL HIGH (ref 9–23)
BUN / CREAT RATIO: 46
CALCIUM: 9.9 mg/dL (ref 8.7–10.4)
CHLORIDE: 94 mmol/L — ABNORMAL LOW (ref 98–107)
CO2: 25.2 mmol/L (ref 20.0–31.0)
CREATININE: 2.37 mg/dL — ABNORMAL HIGH
EGFR CKD-EPI (2021) FEMALE: 20 mL/min/{1.73_m2} — ABNORMAL LOW (ref >=60)
GLUCOSE RANDOM: 244 mg/dL — ABNORMAL HIGH (ref 70–179)
POTASSIUM: 3.1 mmol/L — ABNORMAL LOW (ref 3.4–4.8)
SODIUM: 130 mmol/L — ABNORMAL LOW (ref 135–145)

## 2023-10-06 LAB — MAGNESIUM: MAGNESIUM: 2.1 mg/dL (ref 1.6–2.6)

## 2023-10-06 LAB — PRO-BNP: PRO-BNP: 14251 pg/mL — ABNORMAL HIGH (ref <=300.0)

## 2023-10-06 MED ORDER — AMIODARONE 200 MG TABLET
ORAL_TABLET | Freq: Two times a day (BID) | ORAL | 11 refills | 30 days | Status: CP
Start: 2023-10-06 — End: 2024-10-05

## 2023-10-06 MED ORDER — CYCLOBENZAPRINE 5 MG TABLET
ORAL_TABLET | Freq: Two times a day (BID) | ORAL | 0 refills | 30 days | Status: CP | PRN
Start: 2023-10-06 — End: ?

## 2023-10-06 MED ORDER — POTASSIUM CHLORIDE ER 20 MEQ TABLET,EXTENDED RELEASE(PART/CRYST)
ORAL_TABLET | Freq: Every day | ORAL | 0 refills | 7 days | Status: CP
Start: 2023-10-06 — End: 2023-10-13

## 2023-10-06 NOTE — Unmapped (Addendum)
[facility] HF Amyloid [facility] Note    Referring Provider: Montez Hageman, AGNP  [address]  [address]  [place],  [place] [address]   Primary Provider: Artelia Laroche, MD  [address]  [place] [address]   Other Providers:  Dr Luretha Murphy, Dr Wonda Cheng    Reason for Visit:  Barbara Huber is a 82 y.o. female being seen for routine visit and continued care of cardiac amyloidosis .    Assessment & Plan:  1. 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 SPEP/UPEP/free light chains without concern. 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 Daughter is informed and has seen cardiology [name]).   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 2nd injection July 2024. Continue vitamin A supplementation. Scheduled for injection today, but holding off in light of recent admission.   - DC'd Jardiance 10mg  daily due to recurrent UTIs  - Weight 193-195 at home, likely new dry weight.   - Continue torsemide 60mg  daily; metolazone 5mg  PRN  - Rx'd midodrine 5mg  TID; has had adequate BP in 110s/70s at home. Continue to monitor and can use PRN for SBP <100.   - Continue Tafamidis 61mg  daily   - spironolactone was stopped during admission for AKI  - She is not interested in CardioMEMS at this time.   1b. Goals of Care  - Had a discussion today w/ pt and daughter [name] regarding pt's goals of care. She doesn't wish to return to the hospital when symptoms worsen. She doesn't want to suffer anymore.  We discussed role of palliative care (scheduled for visit in a few weeks) and hospice (when appropriate).     2. Persistent atrial fibrillation  - CHA2DS2-VASc score = 5 (1-gender, 2-Age, 1-DM, 1-HTN)  - We stopped Eliquis given recurrent anemia, shared decision making w/ pt and daughter. She was not interested in [employer]   - Eliquis 2.5mg  BID restarted during hospitalization - will closely follow hemoglobin in follow up  - On amiodarone 200mg  daily - in AF RVR today despite amiodarone. Will trial an increase to 200mg  BID. Previously didn't tolerate BB.   - amiodarone monitoring - last PFTs in 04/2021. TSH (09/23/23) /LFTs (08/31/23) stable at last check     3. [name]  - Cr baseline around 2.3-3.0  - Peaked at 5.4 during admission, 2.93 on discharge  - today Cr 2.37  - K 3.2 - potassium chloride x7 days    4. COPD  - home O2 3-4L [place] - followed by pulm  - on Advair and Spiriva    5. Iron deficiency anemia  - Finished IV Iron - ferritin 261. No longer anemic.  - As above, recheck hemoglobin at follow-up now that she is back on Eliquis   - labs next week w/ home health  Lab Results   Component Value Date    FERRITIN 336.3 (H) 08/19/2023     6. Anxiety  - previously on Zoloft  - Tried Remeron, but discontinued in setting of worsening HF symptoms    7. DM  - Off Insulin with A1c <7  -  Having some higher BG readings at home >180 - ok to continue to monitor for now given last A1c result  Lab Results   Component Value Date    A1C 5.8 (H) 08/30/2023     8. Acute on chronic back pain  - oxycontin 10mg  BID changed to oxycodone 5mg  BID during admission  - pain is not well controlled - likely exacerbated by hospital stay/hospital bed. Not getting any sleep  - She has been referred for home PT  - Has follow up scheduled w/ pain doctor in November (Dr Ronita Hipps)   - Retry flexeril 5mg  nightly, which she tolerated previously during last flare up of her back pain (feb-May 2024)    Follow-up:  Return in about 2 weeks (around 10/20/2023) for Return HF.     History of Present Illness:  Barbara Huber is a 82 y.o. female with PMHx of COPD on 3L home [place], CHF, [name], [name], [name] She was initially admitted with decompensated [name] in 2021 in setting of  Septic Shock d/t Gangrenous Cholecystitis s/p cholecystectomy. She underwent further evaluation with PYP scan which showed TTR amyloid. She has since been followed in clinic for management of her [name]. In 2022, had numerous admissions for weakness/urosepsis, shortness of breath/orthopnea/weight gain, lightheadedness/SOB/Blurry vision found to be anemic.    Summary of most recent visits:  - 01/16/22 [facility]): Feeling well from a cardiac standpoint. Started Zoloft for anxiety  - 02/13/22 [facility]): Weight 210-211. Feeling well. Taking extra torsemide 20mg  about every other day, which was stopped for elevated Cr.   - 03/13/22 ([name]): Weight 201-216, took metolazone PRN, weight stabilized at 210lbs. Considering cardioMEMS.   - 05/13/22 ([name]): Had 3 iron infusions. Doing well w/ volume, weight 211-212lbs. Cooking, staying active around house.   - 05/21/22 ([name]): CP/SOB. Weight 217 from 213. She took metolazone x1 dose.   - 05/22/22 ([name]): Ed follow up. Back down to 213lbs. Still having CP w/ certain movement.   - 06/05/22 ([name]): Weight 214-216lb, should be 212lb.   - 06/09/22, 06/11/22, 06/22/22 ([name]): Weight down from 217 to 213lb, 211lb at home. Feeling well by 6/26.   - 07/15/22 ([name]): Switched inhaler back to Advair/Spiriva with improvement in breathing and O2 sat. Weight 207lbs, feeling more like herself.   - 08/19/22 ([name]): Feeling well. Weight 208-210lbs.   - 10/22/22 ([name]): Feeling well. Weight has been 210-214lbs.   - 12/04/22 ([name]): doing well overall, weight a bit up so taking extra torsemide.   - 02/10/23 ([name]): Weight 213-214lbs, feeling well, eating a lot. Had taken metolazone earlier in the week.   - 02/23/23 [facility]): Hospital follow up after mechanical fall with nondisplaced fractures of the left superior and inferior pubic rami. Taking torsemide 40mg , weight 211.   - 04/14/23 ([name]): Weight 210-212. 1st Amvuttra  - 07/14/23 [facility]): Weight 211-212 on torsemide 60mg  daily. 2nd Amvuttra.   - 08/18/23-08/23/23 (Admission): fatigue, weakness, DOE, recent prednisone course for gout. Admitted to [facility], poor response to IV Lasix and was changed to IV Bumex 3mg  BID and metolazone. [name] held for low BP. Cr increased to 4.0 with diuresis. She went back to the ER on 8/24 for abdominal pain. She was felt to be euvolemic at a weight of 207lb and IV diuretics were held. She was discharged on torsemide 60mg  daily on 8/26.   - 08/27/23 [facility]): hospital follow up. Felt to be dry for weight down to 204 at home, Cr 3.4  BUN 96. Changed torsemide from 60 daily to 40/20.   - 08/30/23-09/02/23 (Admission): Admitted for fatigue and SOB. Diuresed  with improvement in dyspnea. Weight on discharge was 204.   - 09/06/23 [facility]): Hospital follow up. Her weight continued to decrease on her scale by about 1lb per day. Weight 201 that morning. She was a little lightheaded and felt weak. Continued on torsemide 60mg  daily.  Cr 2.61, pro-BNP 4575  - 09/08/23 ([name]/Phone): Weight 201, breathing well.   - 09/16/23 ([facility]): Weight 205, not peeing much with torsemide 60mg . Feeling full in belly. Cr 3.11,  pro-BNP 14492. Metolazone and extra 40 torsemide.   - 09/17/23 ([facility]): Weight 203, feeling better. Continue torsemide 60/40 through weekend.   - 09/23/23 ([name]): n 9/25 reporting hat she didn't pee much the night before and weight was 203lb that morning. Increased by 2 lbs overnight. Instructed to take metolazone 2.5mg  and extra 40 torsemide in the afternoon. Today her weight was up and so she requested a visit. She didn't urinate much and this morning was 205lbs. Extra torsemide 60mg  then metolazone in morning and return for diuresis clinic.   - 09/24/23 (Diuresis): IV Lasix. Arranged for direct admission.   - 9/27-10/5 (Admission):     Interval history  Pt admitted 9/27-10/5 from clinic. She initially underwent inotrope assisted diuresis with dopamine and furosemide gtt, which was stopped on 9/30 after episodes of hypotension requiring NE and midodrine. Norepinephrine stopped and pulmonary wedge pressure was 12 prior to pulling swan on on 10/1. Midodrine maintained to maximize preload. Furosemide gtt transitioned to spot IV lasix and then was transitioned to home torsemide 60mg  daily on 10/2. Palliative care was consulted and family decided on outpatient evaluation. She was discharged with a pro-BNP of 17K down from 26K and weight of 209lb. Discharged with new home regimen torsemide 60mg  daily, metolazone prn per clinic, midodrine 5mg  TID, Tafamidis 61mg  daily.     She was continued on chronic home amiodarone 200 mg daily for atrial fibrillation and restarted on Eliquis 2.5mg  BID.     Today she presents for follow up, accompanied by her daughter. She is very weak. She is working of trying to [employer] again. Her weight is 193-195lbs and BP 110/70s. She is taking torsemide 60mg  daily and urinating a lot. She is breathing well. Her appetite is low. She is not sleeping well due to back pain; was basically up all night last night, couldn't get comfortable.       Cardiovascular History & Procedures:    Cath / PCI:  none    CV Surgery:   none    EP Procedures and Devices:  none    Non-Invasive Evaluation(s):    Echo:  08/19/23  1. The left ventricle is normal in size with moderately increased wall  thickness.    2. 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    3. The left ventricular systolic function is hyperdynamic, LVEF is visually  estimated at >70%.    4. Mitral annular calcification is present (moderate).    5. The mitral valve leaflets are mildly thickened with normal leaflet  mobility.    6. The aortic valve is trileaflet with mildly thickened leaflets with normal  excursion.    7. The left atrium is moderately dilated in size.    8. The right ventricle is normal in size, with normal systolic function.    02/17/23    1. The left ventricle is normal in size with moderately  increased wall  thickness.    2. The left ventricular systolic function is hyperdynamic, LVEF is visually  estimated at >70%.    3. The left atrium is moderately dilated in size.    4. The right ventricle is normal in size, with normal systolic function.    5. There is mild pulmonary hypertension.    12/09/21  Summary    1. The left ventricle is normal in size with severely increased wall  thickness.    2. The left ventricular systolic function is hyperdynamic, LVEF is visually  estimated at >70%.    3. There is grade III diastolic dysfunction (severely elevated filling  pressure).    4. The left atrium is moderately to severely dilated in size.    5. The right ventricle is mildly dilated in size, with normal systolic  function.    6. The right atrium is mildly dilated  in size.    7. IVC size and inspiratory change suggest elevated right atrial pressure.  (10-20 mmHg).    04/24/21   1. The left ventricle is normal in size with mildly increased wall  thickness.    2. The left ventricular systolic function is normal, LVEF is visually  estimated at > 55%.    3. There is grade II diastolic dysfunction (elevated filling pressure).    4. Mitral annular calcification is present (mild).    5. The left atrium is mildly dilated in size.    6. The right ventricle is mildly dilated in size, with low normal systolic  function.    7. IVC size and inspiratory change suggest elevated right atrial pressure.  (10-20 mmHg).    09/30/20  Summary    1. Limited study to assess systolic function.    2. IVC size and inspiratory change suggest normal right atrial pressure.  (0-5 mmHg).    3. The left ventricle is normal in size with mildly to moderately increased  wall thickness.    4. The left ventricular systolic function is normal with no obvious wall  motion abnormalities, LVEF is visually estimated at > 55%.    5. Mitral annular calcification is present. 6. The left atrium is mildly dilated in size.    7. The right ventricle is upper normal in size, with reduced systolic  Function.    Cardiac CT/MRI/Nuclear Tests:   None    6 Minute Walk:  None    Cardiopulmonary Stress Tests:   None               Other Past Medical History:  See below for the complete EPIC list of past medical and surgical history.      Allergies:  Nitrofurantoin and Lipitor [atorvastatin]    Current Medications:  Current Outpatient Medications   Medication Sig Dispense Refill    acetaminophen (TYLENOL) 500 MG tablet Take 2 tablets (1,000 mg total) by mouth Three (3) times a day as needed for pain.      apixaban (ELIQUIS) 2.5 mg Tab Take 1 tablet (2.5 mg total) by mouth two (2) times a day. 60 tablet 2    cranberry 500 mg cap Take 500 mg by mouth daily with evening meal.      ergocalciferol-1,250 mcg, 50,000 unit, (DRISDOL) 1,250 mcg (50,000 unit) capsule Take 1 capsule (1,250 mcg total) by mouth once a week. 12 capsule 3    estradioL (ESTRACE) 0.01 % (0.1 mg/gram) vaginal cream Insert 2 g into the vagina Two (2) times a week. 42.5 g  3    ferrous sulfate 325 (65 FE) MG EC tablet Take 1 tablet (325 mg total) by mouth Every Monday, Wednesday, and Friday. 36 tablet 3    fluticasone propion-salmeterol (ADVAIR HFA) 115-21 mcg/actuation inhaler Inhale 2 puffs two (2) times a day. 12 g 11    [START ON 10/07/2023] fosfomycin (MONUROL) 3 gram Pack Take 3 g by mouth once a week. 12 g 3    gabapentin (NEURONTIN) 100 MG capsule Take 1 capsule (100 mg total) by mouth two (2) times a day. 60 capsule 11    lidocaine (ASPERCREME) 4 % patch Place 1 patch on the skin daily. Remove after 12 hours. 30 patch 2    metOLazone (ZAROXOLYN) 5 MG tablet Take 1 tablet (5 mg total) by mouth daily as needed (up to 3x weekly when instructed by cardiology clinic). 12 tablet 1    midodrine (PROAMATINE) 5 MG tablet Take 1 tablet (5 mg total) by mouth three (3) times a day. 90 tablet 2    montelukast (SINGULAIR) 10 mg tablet Take 1 tablet (10 mg total) by mouth every morning. 90 tablet 2    NARCAN 4 mg/actuation nasal spray 1 spray into alternating nostrils once as needed (opioid overdose). PRN - Emergency use.      oxyCODONE (ROXICODONE) 5 MG immediate release tablet Take 1 tablet (5 mg total) by mouth every eight (8) hours as needed for pain (moderate to severe pain). 15 tablet 0    OXYGEN-AIR DELIVERY SYSTEMS MISC 5 L by Miscellaneous route. Currently using 3  L/min via [place]      pantoprazole (PROTONIX) 20 MG tablet TAKE 1 TABLET(20 MG) BY MOUTH DAILY 90 tablet 3    rosuvastatin (CRESTOR) 5 MG tablet TAKE 1 TABLET EVERY OTHER DAY 24 tablet 0    tafamidis (VYNDAMAX) 61 mg cap Take 1 capsule (61 mg) by mouth daily. 30 capsule 11    tiotropium bromide (SPIRIVA RESPIMAT) 2.5 mcg/actuation inhalation mist Inhale 2 puffs daily. 4 g 11    torsemide (DEMADEX) 20 MG tablet Take 3 tablets (60 mg total) by mouth daily.      vitamin A-3,000 mcg RAE, 10,000 UNIT, 3,000 mcg RAE (10,000 UNIT) capsule Take 1 capsule (3,000 mcg of RAE total) by mouth daily.      vutrisiran (AMVUTTRA) 25 mg/0.5 mL injection Inject 0.5 mL (25 mg total) under the skin once. Every 12 Weeks      albuterol HFA 90 mcg/actuation inhaler Inhale 2 puffs every eight (8) hours as needed for wheezing. (Patient not taking: Reported on 10/06/2023)      amiodarone (PACERONE) 200 MG tablet Take 1 tablet (200 mg total) by mouth two (2) times a day. 60 tablet 11    cyclobenzaprine (FLEXERIL) 5 MG tablet Take 1 tablet (5 mg total) by mouth two (2) times a day as needed for muscle spasms. 60 tablet 0     No current facility-administered medications for this visit.       Family History:  The patient's family history includes Cancer in her father; Hypertension in her mother; No Known Problems in her brother, brother, maternal aunt, maternal grandfather, maternal grandmother, maternal uncle, paternal aunt, paternal grandfather, paternal grandmother, paternal uncle, sister, sister, and another family member.    Social history:  She  reports that she quit smoking about 5 years ago. Her smoking use included cigarettes. She has never used smokeless tobacco. She reports that she does not drink alcohol and does not use drugs.  Review of Systems:  As per HPI.  Rest of the review of ten systems is negative or unremarkable except as stated above.    Physical Exam:  VITAL SIGNS:   Vitals:    10/06/23 1311   BP: 110/77   Pulse: 122   SpO2: 96%           Wt Readings from Last 3 Encounters:   10/06/23 90.1 kg (198 lb 9.6 oz)   09/30/23 95 kg (209 lb 7 oz)   09/24/23 94.3 kg (208 lb)      Today's Body mass index is 33.05 kg/m??.   Height: 165.1 cm (5' 5)  CONSTITUTIONAL: well-appearing in no acute distress  EYES: Conjunctivae and sclerae clear and anicteric.  ENT: Benign.   CARDIOVASCULAR: JVP not seen above the clavicle with HOB at 90 degrees. Rate and rhythm are regular.  There is no lifts or heaves.  Normal S1, S2. There is no murmur, gallops or rubs.  Radial and pedal pulses are 2+, bilaterally.   There is trace edema to ankles, left greater than right.   RESPIRATORY: Decreased breath sounds at bases.  There are no wheezes.  GASTROINTESTINAL: Soft, non-tender, with audible bowel sounds. Abdomen nondistended.  Liver is nonpalpable.  SKIN: No rashes, ecchymosis or petechiae.  Warm, well perfused.   MUSCULOSKELETAL:  no joint swelling   NEURO/PSYCH: Appropriate mood and affect. Alert and oriented to person, place, and time. No gross motor or sensory deficits evident.    Pertinent Laboratory Studies:   No results displayed because visit has over 200 results.      Office Visit on 09/23/2023   Component Date Value Ref Range Status    Sodium 09/23/2023 131 (L)  135 - 145 mmol/L Final    Potassium 09/23/2023 4.7  3.4 - 4.8 mmol/L Final    Chloride 09/23/2023 98  98 - 107 mmol/L Final    CO2 09/23/2023 23.4  20.0 - 31.0 mmol/L Final    Anion Gap 09/23/2023 10  5 - 14 mmol/L Final    BUN 09/23/2023 100 (H)  9 - 23 mg/dL Final Creatinine 16/09/9603 4.46 (H)  0.55 - 1.02 mg/dL Final    BUN/Creatinine Ratio 09/23/2023 22   Final    eGFR [name]-EPI (2021) Female 09/23/2023 9 (L)  >=60 mL/min/1.39m2 Final    Glucose 09/23/2023 190 (H)  70 - 179 mg/dL Final    Calcium 54/08/8118 9.5  8.7 - 10.4 mg/dL Final    PRO-BNP 14/78/2956 17,133.0 (H)  <=300.0 pg/mL Final    Magnesium 09/23/2023 2.4  1.6 - 2.6 mg/dL Final    Free T4 21/30/8657 1.39  0.89 - 1.76 ng/dL Final    TSH 84/69/6295 2.346  0.550 - 4.780 uIU/mL Final   Appointment on 09/16/2023   Component Date Value Ref Range Status    Magnesium 09/16/2023 2.4  1.6 - 2.6 mg/dL Final    Sodium 28/41/3244 136  135 - 145 mmol/L Final    Potassium 09/16/2023 4.7  3.4 - 4.8 mmol/L Final    Chloride 09/16/2023 103  98 - 107 mmol/L Final    CO2 09/16/2023 22.1  20.0 - 31.0 mmol/L Final    Anion Gap 09/16/2023 11  5 - 14 mmol/L Final    BUN 09/16/2023 89 (H)  9 - 23 mg/dL Final    Creatinine 12/30/7251 3.11 (H)  0.55 - 1.02 mg/dL Final    BUN/Creatinine Ratio 09/16/2023 29   Final    eGFR [name]-EPI (2021) Female 09/16/2023 15 (  L)  >=60 mL/min/1.38m2 Final    Glucose 09/16/2023 172  70 - 179 mg/dL Final    Calcium 16/09/9603 9.4  8.7 - 10.4 mg/dL Final    PRO-BNP 54/08/8118 14,492.0 (H)  <=300.0 pg/mL Final   Office Visit on 09/06/2023   Component Date Value Ref Range Status    EKG Ventricular Rate 09/06/2023 69  BPM Final    EKG Atrial Rate 09/06/2023 69  BPM Final    EKG P-R Interval 09/06/2023 194  ms Final    EKG QRS Duration 09/06/2023 114  ms Final    EKG Q-T Interval 09/06/2023 464  ms Final    EKG QTC Calculation 09/06/2023 497  ms Final    EKG Calculated P Axis 09/06/2023 75  degrees Final    EKG Calculated R Axis 09/06/2023 -65  degrees Final    EKG Calculated T Axis 09/06/2023 62  degrees Final    QTC Fredericia 09/06/2023 486  ms Final    Sodium 09/06/2023 136  135 - 145 mmol/L Final    Potassium 09/06/2023 4.3  3.4 - 4.8 mmol/L Final    Chloride 09/06/2023 100  98 - 107 mmol/L Final    CO2 09/06/2023 25.9  20.0 - 31.0 mmol/L Final    Anion Gap 09/06/2023 10  5 - 14 mmol/L Final    BUN 09/06/2023 84 (H)  9 - 23 mg/dL Final    Creatinine 14/78/2956 2.61 (H)  0.55 - 1.02 mg/dL Final    BUN/Creatinine Ratio 09/06/2023 32   Final    eGFR [name]-EPI (2021) Female 09/06/2023 18 (L)  >=60 mL/min/1.65m2 Final    Glucose 09/06/2023 248 (H)  70 - 179 mg/dL Final    Calcium 21/30/8657 10.0  8.7 - 10.4 mg/dL Final    Magnesium 84/69/6295 2.3  1.6 - 2.6 mg/dL Final    PRO-BNP 28/41/3244 4,575.0 (H)  <=300.0 pg/mL Final   Admission on 08/30/2023, Discharged on 09/02/2023   Component Date Value Ref Range Status    Sodium 08/30/2023 134 (L)  135 - 145 mmol/L Final    Potassium 08/30/2023 4.4  3.4 - 4.8 mmol/L Final    Chloride 08/30/2023 99  98 - 107 mmol/L Final    CO2 08/30/2023 24.3  20.0 - 31.0 mmol/L Final    Anion Gap 08/30/2023 11  5 - 14 mmol/L Final    BUN 08/30/2023 103 (H)  9 - 23 mg/dL Final    Creatinine 12/30/7251 3.06 (H)  0.55 - 1.02 mg/dL Final    BUN/Creatinine Ratio 08/30/2023 34   Final    eGFR [name]-EPI (2021) Female 08/30/2023 15 (L)  >=60 mL/min/1.49m2 Final    Glucose 08/30/2023 260 (H)  70 - 179 mg/dL Final    Calcium 66/44/0347 9.5  8.7 - 10.4 mg/dL Final    Albumin 42/59/5638 3.4  3.4 - 5.0 g/dL Final    Total Protein 08/30/2023 7.7  5.7 - 8.2 g/dL Final    Total Bilirubin 08/30/2023 0.4  0.3 - 1.2 mg/dL Final    AST 75/64/3329 31  <=34 U/L Final    ALT 08/30/2023 15  10 - 49 U/L Final    Alkaline Phosphatase 08/30/2023 197 (H)  46 - 116 U/L Final    PT 08/30/2023 13.2 (H)  9.9 - 12.6 sec Final    INR 08/30/2023 1.20   Final    hsTroponin I 08/30/2023 164 (HH)  <=34 ng/L Final    EKG Ventricular Rate 08/30/2023 81  BPM Final  EKG Atrial Rate 08/30/2023 83  BPM Final    EKG QRS Duration 08/30/2023 118  ms Final    EKG Q-T Interval 08/30/2023 408  ms Final    EKG QTC Calculation 08/30/2023 473  ms Final    EKG Calculated R Axis 08/30/2023 268  degrees Final    EKG Calculated T Axis 08/30/2023 63 degrees Final    QTC Fredericia 08/30/2023 450  ms Final    WBC 08/30/2023 9.0  3.6 - 11.2 10*9/L Final    RBC 08/30/2023 4.24  3.95 - 5.13 10*12/L Final    HGB 08/30/2023 12.8  11.3 - 14.9 g/dL Final    HCT 16/09/9603 39.1  34.0 - 44.0 % Final    MCV 08/30/2023 92.3  77.6 - 95.7 fL Final    MCH 08/30/2023 30.2  25.9 - 32.4 pg Final    MCHC 08/30/2023 32.7  32.0 - 36.0 g/dL Final    RDW 54/08/8118 14.5  12.2 - 15.2 % Final    MPV 08/30/2023 8.6  6.8 - 10.7 fL Final    Platelet 08/30/2023 320  150 - 450 10*9/L Final    nRBC 08/30/2023 0  <=4 /100 WBCs Final    Neutrophils % 08/30/2023 79.1  % Final    Lymphocytes % 08/30/2023 6.8  % Final    Monocytes % 08/30/2023 12.3  % Final    Eosinophils % 08/30/2023 1.0  % Final    Basophils % 08/30/2023 0.8  % Final    Absolute Neutrophils 08/30/2023 7.1  1.8 - 7.8 10*9/L Final    Absolute Lymphocytes 08/30/2023 0.6 (L)  1.1 - 3.6 10*9/L Final    Absolute Monocytes 08/30/2023 1.1 (H)  0.3 - 0.8 10*9/L Final    Absolute Eosinophils 08/30/2023 0.1  0.0 - 0.5 10*9/L Final    Absolute Basophils 08/30/2023 0.1  0.0 - 0.1 10*9/L Final    hsTroponin I 08/30/2023 162 (HH)  <=34 ng/L Final    delta hsTroponin I 08/30/2023 2  <=7 ng/L Final    Specimen Source 08/30/2023 Venous   Final    FIO2 Venous 08/30/2023 Not Specified   Final    pH, Venous 08/30/2023 7.38  7.32 - 7.43 Final    pCO2, Ven 08/30/2023 48  40 - 60 mm Hg Final    pO2, Ven 08/30/2023 41 (H)  35 - 40 mm Hg Final    HCO3, Ven 08/30/2023 26  22 - 27 mmol/L Final    Base Excess, Ven 08/30/2023 2.9 (H)  -2.0 - 2.0 Final    O2 Saturation, Venous 08/30/2023 73.3  40.0 - 85.0 % Final    BNP 08/30/2023 450.68 (H)  <=100 pg/mL Final    SARS-CoV-2 PCR 08/30/2023 Negative  Negative Final    Influenza A 08/30/2023 Negative  Negative Final    Influenza B 08/30/2023 Negative  Negative Final    RSV 08/30/2023 Negative  Negative Final    hsTroponin I 08/31/2023 173 (HH)  <=34 ng/L Final    delta hsTroponin I 08/31/2023 11 (H)  <=7 ng/L Final    Color, UA 08/31/2023 Light Yellow   Final    Clarity, UA 08/31/2023 Clear   Final    Specific Gravity, UA 08/31/2023 1.013  1.003 - 1.030 Final    pH, UA 08/31/2023 5.0  5.0 - 9.0 Final    Leukocyte Esterase, UA 08/31/2023 Small (A)  Negative Final    Nitrite, UA 08/31/2023 Negative  Negative Final    Protein, UA 08/31/2023 Negative  Negative Final    Glucose, UA 08/31/2023 Negative  Negative Final    Ketones, UA 08/31/2023 Negative  Negative Final    Urobilinogen, UA 08/31/2023 <2.0 mg/dL  <1.6 mg/dL Final    Bilirubin, UA 08/31/2023 Negative  Negative Final    Blood, UA 08/31/2023 Trace (A)  Negative Final    RBC, UA 08/31/2023 <1  <=4 /HPF Final    WBC, UA 08/31/2023 3  0 - 5 /HPF Final    Squam Epithel, UA 08/31/2023 2  0 - 5 /HPF Final    Bacteria, UA 08/31/2023 Moderate (A)  None Seen /HPF Final    Hyaline Casts, UA 08/31/2023 6 (H)  0 - 1 /LPF Final    Mucus, UA 08/31/2023 Rare (A)  None Seen /HPF Final    Creat U 08/31/2023 75.3  Undefined mg/dL Final    Protein, Ur 10/96/0454 7.4  Undefined mg/dL Final    Protein/Creatinine Ratio, Urine 08/31/2023 0.098  Undefined Final    WBC 08/31/2023 8.2  3.6 - 11.2 10*9/L Final    RBC 08/31/2023 3.95  3.95 - 5.13 10*12/L Final    HGB 08/31/2023 11.9  11.3 - 14.9 g/dL Final    HCT 09/81/1914 36.5  34.0 - 44.0 % Final    MCV 08/31/2023 92.5  77.6 - 95.7 fL Final    MCH 08/31/2023 30.1  25.9 - 32.4 pg Final    MCHC 08/31/2023 32.6  32.0 - 36.0 g/dL Final    RDW 78/29/5621 14.8  12.2 - 15.2 % Final    MPV 08/31/2023 8.4  6.8 - 10.7 fL Final    Platelet 08/31/2023 269  150 - 450 10*9/L Final    Magnesium 08/31/2023 2.3  1.6 - 2.6 mg/dL Final    Phosphorus 30/86/5784 4.5  2.4 - 5.1 mg/dL Final    Sodium 69/62/9528 140  135 - 145 mmol/L Final    Potassium 08/31/2023 3.9  3.4 - 4.8 mmol/L Final    Chloride 08/31/2023 101  98 - 107 mmol/L Final    CO2 08/31/2023 26.2  20.0 - 31.0 mmol/L Final    Anion Gap 08/31/2023 13  5 - 14 mmol/L Final    BUN 08/31/2023 99 (H)  9 - 23 mg/dL Final    Creatinine 41/32/4401 2.82 (H)  0.55 - 1.02 mg/dL Final    BUN/Creatinine Ratio 08/31/2023 35   Final    eGFR [name]-EPI (2021) Female 08/31/2023 16 (L)  >=60 mL/min/1.41m2 Final    Glucose 08/31/2023 132  70 - 179 mg/dL Final    Calcium 02/72/5366 9.4  8.7 - 10.4 mg/dL Final    Albumin 44/02/4741 3.0 (L)  3.4 - 5.0 g/dL Final    Total Protein 08/31/2023 6.9  5.7 - 8.2 g/dL Final    Total Bilirubin 08/31/2023 0.5  0.3 - 1.2 mg/dL Final    AST 59/56/3875 28  <=34 U/L Final    ALT 08/31/2023 10  10 - 49 U/L Final    Alkaline Phosphatase 08/31/2023 174 (H)  46 - 116 U/L Final    Hemoglobin A1C 08/30/2023 5.8 (H)  4.8 - 5.6 % Final    Estimated Average Glucose 08/30/2023 120  mg/dL Final    TSH 64/33/2951 0.963  0.550 - 4.780 uIU/mL Final    T3, Free 08/31/2023 1.77 (L)  2.30 - 4.20 pg/mL Final    Free T4 08/31/2023 1.79 (H)  0.89 - 1.76 ng/dL Final    Cortisol 88/41/6606 41.5  See Comment ug/dL Final  Glucose, POC 08/31/2023 150  70 - 179 mg/dL Final    Glucose, POC 16/09/9603 220 (H)  70 - 179 mg/dL Final    Glucose, POC 54/08/8118 172  70 - 179 mg/dL Final    Glucose, POC 14/78/2956 168  70 - 179 mg/dL Final    Sodium 21/30/8657 138  135 - 145 mmol/L Final    Potassium 09/01/2023 4.5  3.4 - 4.8 mmol/L Final    Chloride 09/01/2023 101  98 - 107 mmol/L Final    CO2 09/01/2023 28.8  20.0 - 31.0 mmol/L Final    Anion Gap 09/01/2023 8  5 - 14 mmol/L Final    BUN 09/01/2023 97 (H)  9 - 23 mg/dL Final    Creatinine 84/69/6295 2.87 (H)  0.55 - 1.02 mg/dL Final    BUN/Creatinine Ratio 09/01/2023 34   Final    eGFR [name]-EPI (2021) Female 09/01/2023 16 (L)  >=60 mL/min/1.35m2 Final    Glucose 09/01/2023 132  70 - 179 mg/dL Final    Calcium 28/41/3244 9.5  8.7 - 10.4 mg/dL Final    Magnesium 12/30/7251 2.3  1.6 - 2.6 mg/dL Final    WBC 66/44/0347 9.6  3.6 - 11.2 10*9/L Final    RBC 09/01/2023 3.97  3.95 - 5.13 10*12/L Final    HGB 09/01/2023 11.8  11.3 - 14.9 g/dL Final    HCT 42/59/5638 36.6  34.0 - 44.0 % Final MCV 09/01/2023 92.2  77.6 - 95.7 fL Final    MCH 09/01/2023 29.7  25.9 - 32.4 pg Final    MCHC 09/01/2023 32.2  32.0 - 36.0 g/dL Final    RDW 75/64/3329 14.8  12.2 - 15.2 % Final    MPV 09/01/2023 8.5  6.8 - 10.7 fL Final    Platelet 09/01/2023 289  150 - 450 10*9/L Final    Glucose, POC 09/01/2023 144  70 - 179 mg/dL Final    Glucose, POC 51/88/4166 240 (H)  70 - 179 mg/dL Final    Glucose, POC 06/26/1600 142  70 - 179 mg/dL Final    Glucose, POC 09/32/3557 190 (H)  70 - 179 mg/dL Final    Sodium 32/20/2542 139  135 - 145 mmol/L Final    Potassium 09/02/2023 4.4  3.4 - 4.8 mmol/L Final    Chloride 09/02/2023 101  98 - 107 mmol/L Final    CO2 09/02/2023 26.1  20.0 - 31.0 mmol/L Final    Anion Gap 09/02/2023 12  5 - 14 mmol/L Final    BUN 09/02/2023 89 (H)  9 - 23 mg/dL Final    Creatinine 70/62/3762 3.20 (H)  0.55 - 1.02 mg/dL Final    BUN/Creatinine Ratio 09/02/2023 28   Final    eGFR [name]-EPI (2021) Female 09/02/2023 14 (L)  >=60 mL/min/1.31m2 Final    Glucose 09/02/2023 125  70 - 179 mg/dL Final    Calcium 83/15/1761 9.7  8.7 - 10.4 mg/dL Final    Magnesium 60/73/7106 2.1  1.6 - 2.6 mg/dL Final    Glucose, POC 26/94/8546 153  70 - 179 mg/dL Final    Glucose, POC 27/02/5008 251 (H)  70 - 179 mg/dL Final   Office Visit on 08/27/2023   Component Date Value Ref Range Status    BNP 08/27/2023 324.32 (H)  <=100 pg/mL Final    Sodium 08/27/2023 133 (L)  135 - 145 mmol/L Final    Potassium 08/27/2023 4.3  3.4 - 4.8 mmol/L Final    Chloride 08/27/2023 95 (L)  98 - 107 mmol/L Final    CO2 08/27/2023 28.2  20.0 - 31.0 mmol/L Final    Anion Gap 08/27/2023 10  5 - 14 mmol/L Final    BUN 08/27/2023 96 (H)  9 - 23 mg/dL Final    Creatinine 16/09/9603 3.41 (H)  0.55 - 1.02 mg/dL Final    BUN/Creatinine Ratio 08/27/2023 28   Final    eGFR [name]-EPI (2021) Female 08/27/2023 13 (L)  >=60 mL/min/1.5m2 Final    Glucose 08/27/2023 250 (H)  70 - 179 mg/dL Final    Calcium 54/08/8118 9.6  8.7 - 10.4 mg/dL Final    Magnesium 14/78/2956 2.2  1.6 - 2.6 mg/dL Final   No results displayed because visit has over 200 results.      Office Visit on 07/30/2023   Component Date Value Ref Range Status    Joint Fluid Culture 07/30/2023 NO GROWTH   Final    Gram Stain Result 07/30/2023 Direct Specimen Gram Stain   Final    Gram Stain Result 07/30/2023 No polymorphonuclear leukocytes seen   Final    Gram Stain Result 07/30/2023 No organisms seen   Final    Fluid Type 07/30/2023 Fluid, Joint   Final    Color, Fluid 07/30/2023 Brown   Final    Appearance, Fluid 07/30/2023 Cloudy   Final    Nucleated Cells, Fluid 07/30/2023 1,150  Undefined ul Final    RBC, Fluid 07/30/2023 5,975  ul Final    Neutrophil %, Fluid 07/30/2023 39.0  % Final    Lymphocytes %, Fluid 07/30/2023 1.0  % Final    Mono/Macro % , Fluid 07/30/2023 60.0  % Final    #Cells Counted BF Diff 07/30/2023 100   Final    Crystal Analysis 07/30/2023 Crystals present (AA)  No crystals seen Final   Office Visit on 07/14/2023   Component Date Value Ref Range Status    Sodium 07/14/2023 139  135 - 145 mmol/L Final    Potassium 07/14/2023 4.4  3.4 - 4.8 mmol/L Final    Chloride 07/14/2023 102  98 - 107 mmol/L Final    CO2 07/14/2023 27.7  20.0 - 31.0 mmol/L Final    Anion Gap 07/14/2023 9  5 - 14 mmol/L Final    BUN 07/14/2023 58 (H)  9 - 23 mg/dL Final    Creatinine 21/30/8657 2.50 (H)  0.55 - 1.02 mg/dL Final    BUN/Creatinine Ratio 07/14/2023 23   Final    eGFR [name]-EPI (2021) Female 07/14/2023 19 (L)  >=60 mL/min/1.89m2 Final    Glucose 07/14/2023 158  70 - 179 mg/dL Final    Calcium 84/69/6295 9.6  8.7 - 10.4 mg/dL Final    Albumin 28/41/3244 3.4  3.4 - 5.0 g/dL Final    Total Protein 07/14/2023 7.6  5.7 - 8.2 g/dL Final    Total Bilirubin 07/14/2023 0.4  0.3 - 1.2 mg/dL Final    AST 12/30/7251 16  <=34 U/L Final    ALT 07/14/2023 8 (L)  10 - 49 U/L Final    Alkaline Phosphatase 07/14/2023 132 (H)  46 - 116 U/L Final    BNP 07/14/2023 118.62 (H)  <=100 pg/mL Final    Magnesium 07/14/2023 2.3  1.6 - 2.6 mg/dL Final    TSH 66/44/0347 1.376  0.550 - 4.780 uIU/mL Final    EKG Ventricular Rate 07/14/2023 69  BPM Final    EKG Atrial Rate 07/14/2023 69  BPM Final    EKG P-R Interval 07/14/2023 188  ms Final    EKG QRS Duration 07/14/2023 108  ms Final    EKG Q-T Interval 07/14/2023 458  ms Final    EKG QTC Calculation 07/14/2023 490  ms Final    EKG Calculated P Axis 07/14/2023 22  degrees Final    EKG Calculated R Axis 07/14/2023 131  degrees Final    EKG Calculated T Axis 07/14/2023 -9  degrees Final    QTC Fredericia 07/14/2023 480  ms Final    Vitamin D Total (25OH) 07/14/2023 33.7  20.0 - 80.0 ng/mL Final       Lab Results   Component Value Date    PRO-BNP 17,737.0 (H) 09/29/2023    PRO-BNP 26,088.0 (H) 09/24/2023    PRO-BNP 17,133.0 (H) 09/23/2023    Creatinine 2.93 (H) 10/01/2023    Creatinine 2.21 (H) 09/30/2023    Creatinine 0.79 01/06/2011    BUN 94 (H) 10/01/2023    BUN 84 (H) 09/30/2023    BUN 20 01/06/2011    Sodium 132 (L) 10/01/2023    Sodium 140 01/06/2011    Potassium 3.6 10/01/2023    Potassium 4.1 01/06/2011    CO2 26.0 10/01/2023    CO2 30 01/06/2011    Magnesium 2.3 10/01/2023    Magnesium 2.0 01/06/2011    Total Bilirubin 1.3 (H) 09/29/2023    INR 1.04 09/24/2023    INR 1.0 01/06/2011       Lab Results   Component Value Date    Digoxin Level 0.9 10/01/2020       Lab Results   Component Value Date    TSH 2.346 09/23/2023    Cholesterol 95 12/06/2021    Triglycerides 66 12/06/2021    HDL 42 12/06/2021    Non-HDL Cholesterol 53 (L) 12/06/2021    LDL Calculated 40 12/06/2021       Lab Results   Component Value Date    WBC 13.4 (H) 10/01/2023    WBC 12.5 (H) 01/06/2011    HGB 13.6 10/01/2023    HGB 12.9 01/06/2011    HCT 41.9 10/01/2023    HCT 40.8 01/06/2011    Platelet 126 (L) 10/01/2023    Platelet 260 01/06/2011       Pertinent Test Results from Today:  None    Other pertinent records were reviewed.    The following are further history from the patient's EPIC record for reference:     Past Medical History:   Diagnosis Date    Acute kidney injury superimposed on chronic kidney disease (CMS-HCC) 10/11/2020    Acute on chronic diastolic (congestive) heart failure (CMS-HCC) 08/23/2020    Acute on chronic diastolic congestive heart failure (CMS-HCC)     AKI (acute kidney injury) (CMS-HCC) 04/14/2015    Lab Results  Component  Value  Date     CREATININE  1.90 (H)  06/12/2021     Had a bump in her creatinine when she was taking her diuretics every day.  She is currently taking 40 mg daily of torsemide and 50 mg of spironolactone.  Her volume status is fragile.  Previously when she stopped her diuretic she becomes short of breath.  Plan: We will check her BMP today.  We will likely have to go to 40    Anemia 08/22/2019    Iron deficiency anemia          Lab Results      Component    Value    Date  WBC    9.2    03/21/2021           RBC    3.67 (L)    03/21/2021           HGB    9.9 (L)    03/21/2021           HCT    30.7 (L)    03/21/2021           MCV    83.7    03/21/2021           MCH    26.9    03/21/2021           MCHC    32.1    03/21/2021           RDW    21.9 (H)    03/21/2021           PLT    318     Arthritis     Calculus of kidney     Calculus of ureter     CHF (congestive heart failure) (CMS-HCC)     Chronic atrial fibrillation (CMS-HCC) 07/20/2019    COPD (chronic obstructive pulmonary disease) (CMS-HCC)     Diabetes (CMS-HCC)     Gangrenous cholecystitis 10/11/2020    Generalized edema  06/17/2021    GERD (gastroesophageal reflux disease)     Hydronephrosis     Hypertension     Hyponatremia 10/11/2020    Intermediate coronary syndrome (CMS-HCC) 03/13/2014    Lower extremity edema 09/28/2020    Lumbar stenosis     Microscopic hematuria     Nausea alone     Nephrolithiasis 04/17/2016    Neuropathy     Nocturia     Nocturia 07/01/2017    Other chronic cystitis     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  See plans for anemia, AKI  We are also tapering her gabapentin.  Currently on 300 mg 3 times daily.  We will decrease it to twice daily, and then nightly.  She states she is not having any recurrence of her pain.    Pulmonary hypertension (CMS-HCC)     Renal colic     Shortness of breath 04/28/2020    Sleep apnea     Unstable angina pectoris (CMS-HCC) 03/13/2014       Past Surgical History:   Procedure Laterality Date    BACK SURGERY  1995    CARPAL TUNNEL RELEASE Left 2014    HYSTERECTOMY  1971    IR INSERT CHOLECYSTOSMY TUBE PERCUTANEOUS  10/02/2020    IR INSERT CHOLECYSTOSMY TUBE PERCUTANEOUS 10/02/2020 Braulio Conte, MD IMG [facility]    LUMBAR DISC SURGERY      PR REMOVAL GALLBLADDER N/A 10/06/2020    Procedure: CHOLECYSTECTOMY;  Surgeon: Katherina Mires, MD;  Location: MAIN OR [facility];  Service: Trauma    PR RIGHT HEART CATH O2 SATURATION & CARDIAC OUTPUT N/A 09/30/2020    Procedure: Right Heart Catheterization;  Surgeon: Neal Dy, MD;  Location: [facility] CATH;  Service: Cardiology    PR RIGHT HEART CATH O2 SATURATION & CARDIAC OUTPUT N/A 01/09/2021    Procedure: Right Heart Catheterization;  Surgeon: Lesle Reek, MD;  Location: [facility] CATH;  Service: Cardiology

## 2023-10-06 NOTE — Unmapped (Signed)
Today,    MEDICATIONS:  We are changing your medications today.  - Increase amiodarone to 200mg  twice a day for afib  - Try flexeril 5mg  at night. Increase to 7.5 (1.5 tablets) if no improvement  Call if you have questions about your medications.    LABS:  We will call you if your labs need attention.    NEXT APPOINTMENT:  Return to clinic in 2-3 weeks with me      In general, to take care of your heart failure:  -Limit your fluid intake to 2 Liters (half-gallon) per day.    -Limit your salt intake to ideally 2-3 grams (2000-3000 mg) per day.  -Weigh yourself daily and record, and bring that weight diary to your next appointment.  (Weight gain of 2-3 pounds in 1 day typically means fluid weight.)    The medications for your heart are to help your heart and help you live longer.    Please contact us before stopping any of your heart medications.    Call the clinic at [phone] with questions.  Our clinic fax number is [phone].  If you need to reschedule future appointments, please call [phone] or [phone]  My office number (c/o De Burrs RN) is [phone] if you need further assistance.    Please do not send a MyChart message for potentially life-threatening symptoms.  Please call 911 for a true medical emergency.    To learn more about heart failure, please read [place]'s Learning to Live with Heart Failure - Available online at:  [email or web address] - open the window for Medical Management and click the link Living with Heart Failure   (Can search [facility] heart failure on the web to find the link.)

## 2023-10-06 NOTE — Unmapped (Signed)
fosfomycin (MONUROL) 3 gram Pack     Prior authorization approved  Payer: [employer] Case ID: [id number]    [phone]    [phone]  Note from payer: [place] Case: [id number], Status: Approved, Coverage Starts on: 12/28/2022 12:00:00 AM, Coverage Ends on: 12/27/2024 12:00:00 AM. Questions? Contact [phone].  Approval Details    Authorization number: [id number]  Authorized from December 28, 2022 to December 27, 2024

## 2023-10-07 DIAGNOSIS — I5032 Chronic diastolic (congestive) heart failure: Principal | ICD-10-CM

## 2023-10-07 MED ORDER — FOSFOMYCIN TROMETHAMINE 3 GRAM ORAL PACKET
ORAL | 3 refills | 28.00000 days | Status: CP
Start: 2023-10-07 — End: 2023-10-07

## 2023-10-11 DIAGNOSIS — R300 Dysuria: Principal | ICD-10-CM

## 2023-10-11 NOTE — Unmapped (Signed)
Called and spoke to Roslyn at [facility]. Informed her per Dr Blain Pais king:  Noted. Benefits outweigh risks. Advise continuing meds.    Lori verbalizes understanding

## 2023-10-11 NOTE — Unmapped (Signed)
Copied from CRM #[id number]. Topic: Access To Clinicians - Medication Question  >> Oct 11, 2023  8:29 AM Verita Schneiders wrote:  [name] from [facility] is calling to report a level 2 of medication interaction   Amiodarone-cephaexin  Midrdrine-hydroxyzine  Rosuvastatin- vyndamax

## 2023-10-11 NOTE — Unmapped (Signed)
Noted. Benefits outweigh risks. Advise continuing meds.

## 2023-10-14 MED ORDER — TRAZODONE 50 MG TABLET
ORAL_TABLET | Freq: Every evening | ORAL | 0 refills | 30 days | Status: CP | PRN
Start: 2023-10-14 — End: 2023-11-13

## 2023-10-15 ENCOUNTER — Telehealth: Admit: 2023-10-15 | Discharge: 2023-10-15 | Payer: MEDICARE

## 2023-10-15 ENCOUNTER — Ambulatory Visit: Admit: 2023-10-15 | Discharge: 2023-10-15 | Payer: MEDICARE

## 2023-10-15 DIAGNOSIS — F419 Anxiety disorder, unspecified: Principal | ICD-10-CM

## 2023-10-15 DIAGNOSIS — N390 Urinary tract infection, site not specified: Principal | ICD-10-CM

## 2023-10-15 DIAGNOSIS — R350 Frequency of micturition: Principal | ICD-10-CM

## 2023-10-15 LAB — URINALYSIS WITH MICROSCOPY WITH CULTURE REFLEX PERFORMABLE
BILIRUBIN UA: NEGATIVE
GLUCOSE UA: NEGATIVE
HYALINE CASTS: 17 /LPF — ABNORMAL HIGH (ref 0–1)
KETONES UA: NEGATIVE
LEUKOCYTE ESTERASE UA: NEGATIVE
NITRITE UA: NEGATIVE
PH UA: 5 (ref 5.0–9.0)
PROTEIN UA: NEGATIVE
RBC UA: 1 /HPF (ref <=4)
SPECIFIC GRAVITY UA: 1.01 (ref 1.003–1.030)
SQUAMOUS EPITHELIAL: 2 /HPF (ref 0–5)
UROBILINOGEN UA: <2
WBC UA: 2 /HPF (ref 0–5)

## 2023-10-15 MED ORDER — LORAZEPAM 0.5 MG TABLET
ORAL_TABLET | ORAL | 0 refills | 5 days | Status: CP
Start: 2023-10-15 — End: 2023-10-20

## 2023-10-15 NOTE — Unmapped (Signed)
Internal Medicine Video Visit    This visit is conducted via video conferencing.    Contact Information  Person Contacted: Patient  Contact Phone number: [phone] (home)   Is there someone else in the room? Yes. What is your relationship? Daughter. Do you want this person here for the visit? Yes.  Patient agreed to a video visit    Barbara Huber is a 82 y.o. female participating in a video visit.    Reason for visit: urinary frequency    Subjective:  She has been experiencing increased urinary frequency over the last few days, urinating almost every hour. She feels she is able to void fully.     She last took fosfomycin on Monday 10/14.     No fever, chills, dysuria, hematuria, urinary retention, abdominal pain, diarrhea, constipation, chest pain, shortness of breath.     Her daughter notes that she typically does not have urinary symptoms with UTIs until later in the disease process, when she is much sicker with fevers, chills, confusion.     She had previously stated she did not want to go back to the hospital to other providers. Today, she noted that she is open to returning to the hospital.     I have reviewed the problem list, medications, and allergies and have updated/reconciled them if needed.    Objective:  General appearance: Frail appearing, nasal cannula in place   Resp: mild increased work of breathing, able to speak in full sentences     10/13/23:  WBC 9.6, Hb 11.9  Gluc 349, Cr 1.94, BUN 79, Na 165, K 4.4  ProBNP 7795    10/14/23:  UA with trace LE, trace blood, 6-10 WBC, many bacteria       Assessment & Plan:  1. Urinary frequency    2. Urinary tract infection without hematuria, site unspecified    3. Anxiety       1. Urinary frequency  2. Urinary tract infection without hematuria, site unspecified  - Urinalysis with Microscopy with Culture Reflex; Future  - Take Fosfomycin today, AFTER urine specimen collected. Change dosing schedule and continue taking fosfomycin once weekly, every Friday.     3. Anxiety  Has had poor sleep due to severe anxiety. Has upcoming appointment with palliative care on 10/21. Will send short term prescription of ativan for symptomatic relief.  - LORazepam (ATIVAN) 0.5 MG tablet; Take 1 tablet (0.5 mg total) by mouth two (2) times a day for 5 days.  Dispense: 10 tablet; Refill: 0         Has follow up appointment 11/18/23 with PCP Dr. Brooke Dare.     Staffed with Dr. Brooke Dare, seen and discussed    The patient reports they are physically located in [place] and is currently: at home. I conducted a audio/video visit. I spent  26m 45s on the video call with the patient. I spent an additional 10 minutes on pre- and post-visit activities on the date of service .

## 2023-10-15 NOTE — Unmapped (Addendum)
Barbara Huber, it was nice meeting you!    - [name], we placed an order for Urinalysis with Reflex Culture. Please go to the [facility] lab to collect the specimen cup, collect the specimen at home, and return the cup to the lab.     - Once the urine has been collected, please take one dose of FOSFOMYCIN today. You may change your schedule and take it again weekly on Fridays from now on.     - Please message Dr. Brooke Dare on Monday with any updates in your symptoms.     Dr. Julious Payer

## 2023-10-15 NOTE — Unmapped (Signed)
[facility] Refill Coordination Note    Specialty Medication(s) to be Shipped:   Cardiology: Vyndamax    Other medication(s) to be shipped:  lidocaine     Barbara Huber, DOB: [birth year]  Phone: [phone] (home)       All above HIPAA information was verified with patient.     Was a Nurse, learning disability used for this call? No    Completed refill call assessment today to schedule patient's medication shipment from the [facility]  [phone]).  All relevant notes have been reviewed.     Specialty medication(s) and dose(s) confirmed: Regimen is correct and unchanged.   Changes to medications: Barbara Huber reports no changes at this time.  Changes to insurance: No  New side effects reported not previously addressed with a pharmacist or physician: None reported  Questions for the pharmacist: No    Confirmed patient received a Conservation officer, historic buildings and a Surveyor, mining with first shipment. The patient will receive a drug information handout for each medication shipped and additional FDA Medication Guides as required.       DISEASE/MEDICATION-SPECIFIC INFORMATION        N/A    SPECIALTY MEDICATION ADHERENCE     Medication Adherence    Patient reported X missed doses in the last month: 0  Specialty Medication: Vyndamax 61mg   Patient is on additional specialty medications: No  Informant: patient              Were doses missed due to medication being on hold? No    Vyndamax 61 mg: 14 days of medicine on hand        REFERRAL TO PHARMACIST     Referral to the pharmacist: Not needed      [facility]     Shipping address confirmed in Epic.       Delivery Scheduled: Yes, Expected medication delivery date: 10/21/23.     Medication will be delivered via Same Day Courier to the prescription address in Epic WAM.    Quintella Reichert   [facility]  Specialty Technician

## 2023-10-18 ENCOUNTER — Telehealth: Admit: 2023-10-18 | Discharge: 2023-10-19 | Payer: MEDICARE

## 2023-10-18 DIAGNOSIS — N184 Chronic kidney disease, stage 4 (severe): Principal | ICD-10-CM

## 2023-10-18 DIAGNOSIS — Z515 Encounter for palliative care: Principal | ICD-10-CM

## 2023-10-18 DIAGNOSIS — I5023 Acute on chronic systolic (congestive) heart failure: Principal | ICD-10-CM

## 2023-10-18 DIAGNOSIS — N3090 Cystitis, unspecified without hematuria: Principal | ICD-10-CM

## 2023-10-18 MED ORDER — LEVOFLOXACIN 250 MG TABLET
ORAL_TABLET | Freq: Every day | ORAL | 0 refills | 3 days | Status: CP
Start: 2023-10-18 — End: ?

## 2023-10-18 NOTE — Unmapped (Signed)
Palliative Care at [facility] Consult Note    Primary Care Provider:  Artelia Laroche, MD  Specialty Providers: [facility] Cards, [place], [facility] Ortho    Barbara Huber is a 82 y.o. female who is seen in consultation at the request of Ross Marcus for evaluation of Symptoms, Goals of Care / Decision Making, and Patient and Family Support    Assessment:   Barbara Huber is a 82 y.o. 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 She does tell me she has some discomfort in neck (from Morgan cath) getting in a good position  -Sleepy hygiene discussed  -Tried and stopped mirtazapine given intolerance (felt like it increased edema)  -Increase trazodone from 50 mg to 100 mg nightly   -Stop lorazepam while increasing trazodone and lorazepam has not been helpful per patient and daughter  -Could consider olanzapine  -daughter wonders about midodrine causing insomina - I looked it up and it's a rare (<1% side effect). I asked cards about potentially going down to BID dosing rather than TID    #Neck pain: since getting Swan catheter in beginning of October during Icu hospitalization   -I wonder if there is any role for further imaging like ultrasound to eval - will ask cards and PCP  -Continue oxycodone as below  -could do lidocaine patch to neck   -could do outpatient PT after home health ends    #Bilateral hand pain: tells me she was diagnose with gout in August  -Daughter states she was told she could take colchicine but that patient felt this worsened HF so stopped  -Will ask PCP about diclofenac gel    #Chronic back pain:   -Sees outpatient pain clinic  [facility] in [place].   -Previously taking OxyContin 12 mg 2 times a day. Held oxycontin in setting of somnolence during recent hospitalization. She was discharged on oxycodone 5 mg q4hr PRN and lidocaine patch instead.   Estimated Creatinine Clearance: 20.2 mL/min (A) (based on SCr of 2.37 mg/dL (H)).    Advance Care Planning:  Medical understanding/Prognostic awareness: Relies on her daughter to provide much of the information. Daughter is good historian. They do know that she had recent flare up of heart failure. We discussed how this is apart of the disease. Patient has had quite a bit of functional decline since this, which I also normalized as part of illness trajectory albeit distressing.   Goals/Hopes/Worries: She tells me that she doesn't worry too much about the future since it's out of her control. She says Everything is going to be alright. This is her motto her daughter says. No matter what happens, everything will be alright. She tells me about her belief and faith in God. She says whatever is going to be is going to be. She confirms today that she would be okay with going back to the hospital. She is not sure she would want to go back to ICU particularly if she had to get another catheter in her neck. We discussed that she could set limits on this is she wants. She was not ready for that at this time.   Code status: I did explore this since I saw a note from 2 years ago that she wanted to  be DNR/DNI. Her daughter remembers conversation. Patient told me this was too much to think about right now and seemed to get overwhelmed so we stopped and can address in future if able  HCDM:   HCDM (With Legal Document To Support): Barbara Huber,Barbara Huber - Daughter - [phone], confirmed on 10/18/23  Advance directive: Yes, uploaded  End of life planning: Not discussed    Coping: No identified needs at this time    Caregiver Support: Daughter is primary caregiver. Currently getting home health     Care Coordination:   -Veteran: did not ask  -Discussed VA supports: no  -Reviewed prior notes/tests from clinic and hospital found in Epic that showed recent HF exacerbation and worsening functional decline.  -I discussed the patient's case and planned management course with PCP and cardiology from [facility].    F/u: 2 months, video     ----------------------------------------  HPI: Barbara Huber is a 82 y.o. female with above diagnoses.     Patient admitted from 9/27-10/5 for acute on chronic HF exacerbation.  She was admitted directly from diuresis clinic.  She initially underwent inotrope assisted diuresis with dopamine and furosemide drips.  These were stopped after episodes of hypotension requiring norepinephrine and midodrine.  Eliquis 2.5 mg twice daily was also restarted in the hospital.    Initial Symptom Review:  Pain:   Chronic pain in back and buttocks. Taking gabapentin and oxycodone 5 mg twice per day.   Gout in both hands since about August so this really hinders her  Sees outpatient pain clinic [facility] in [place]. Takes OxyContin 12 mg 2 times a day. Held oxycontin in setting of somnolence. She was discharged on oxycodone 5 mg q4hr PRN and lidocaine patch instead.   Fatigue: yes   Mobility: walking around the house, rollator. Initially really weak coming out of hospital, improved with home health PT. No falls.   Sleep: Having issues since admission with sleeping. Very hard to go to sleep, can't get comfortable position wise. Neck is still really sore from swan catheter. Trazodone 50 mg nightly.Lorazepam on Friday but hasn't been helping  Appetite: not very good  Nausea: no issues  Bowel function: no issues, Bm every day   Dyspnea: denies     Palliative Performance Scale: 50% - Ambulation: Mainly sit/lie / Unable to do any work, extensive disease / Self-Care:Considerable assistance required, Intake: Normal or reduced / Level of Conscious: Full or confusion  Feeding: Independent  Dressing: Requires Assistance  Ambulation: Independent - rollator  Toileting: Requires Assistance, before admission was independent. Has been trying incontinence briefs   Bathing: Requires Assistance  Assistive devices: rollator    Before going to the hospital, she was cooking herself breakfast, toileting on her own    Psychosocial Information and relevant past history (medical, family, social):  Lives with daughter and her husband.   Her husband died of cancer  Only 1 living daughter. Her other daughter died of colonoscopy      Opioid Risk Tool- Revised:      Yes No   Family history of substance abuse      Alcohol  1  0   Illegal drugs  1  0    Rx drugs  1  0    Personal history of substance abuse      Alcohol  1  0   Illegal drugs  1  0    Rx drugs  1  0    Age between 16--45 years  1  0  Psychological disease      ADD, OCD, bipolar, schizophrenia  1  0   Depression  1  0       Total: Not utilized this visit  (</=2 low risk, >/=3 High Risk)    Medications prior to visit:   Current Outpatient Medications   Medication Sig Dispense Refill    acetaminophen (TYLENOL) 500 MG tablet Take 2 tablets (1,000 mg total) by mouth Three (3) times a day as needed for pain.      albuterol HFA 90 mcg/actuation inhaler Inhale 2 puffs every eight (8) hours as needed for wheezing. (Patient not taking: Reported on 10/06/2023)      amiodarone (PACERONE) 200 MG tablet Take 1 tablet (200 mg total) by mouth two (2) times a day. 60 tablet 11    apixaban (ELIQUIS) 2.5 mg Tab Take 1 tablet (2.5 mg total) by mouth two (2) times a day. 60 tablet 2    cranberry 500 mg cap Take 500 mg by mouth daily with evening meal.      cyclobenzaprine (FLEXERIL) 5 MG tablet Take 1 tablet (5 mg total) by mouth two (2) times a day as needed for muscle spasms. 60 tablet 0    ergocalciferol-1,250 mcg, 50,000 unit, (DRISDOL) 1,250 mcg (50,000 unit) capsule Take 1 capsule (1,250 mcg total) by mouth once a week. 12 capsule 3    estradioL (ESTRACE) 0.01 % (0.1 mg/gram) vaginal cream Insert 2 g into the vagina Two (2) times a week. 42.5 g 3    ferrous sulfate 325 (65 FE) MG EC tablet Take 1 tablet (325 mg total) by mouth Every Monday, Wednesday, and Friday. 36 tablet 3    fluticasone propion-salmeterol (ADVAIR HFA) 115-21 mcg/actuation inhaler Inhale 2 puffs two (2) times a day. 12 g 11    fosfomycin (MONUROL) 3 gram Pack Take 3 g by mouth once a week. 12 g 3    gabapentin (NEURONTIN) 100 MG capsule Take 1 capsule (100 mg total) by mouth two (2) times a day. 60 capsule 11    levoFLOXacin (LEVAQUIN) 250 MG tablet Take 1 tablet (250 mg total) by mouth daily. 3 tablet 0    lidocaine (ASPERCREME) 4 % patch Place 1 patch on the skin daily. Remove after 12 hours. 30 patch 2    LORazepam (ATIVAN) 0.5 MG tablet Take 1 tablet (0.5 mg total) by mouth two (2) times a day for 5 days. 10 tablet 0    metOLazone (ZAROXOLYN) 5 MG tablet Take 1 tablet (5 mg total) by mouth daily as needed (up to 3x weekly when instructed by cardiology clinic). 12 tablet 1    midodrine (PROAMATINE) 5 MG tablet Take 1 tablet (5 mg total) by mouth three (3) times a day. 90 tablet 2    montelukast (SINGULAIR) 10 mg tablet Take 1 tablet (10 mg total) by mouth every morning. 90 tablet 2    NARCAN 4 mg/actuation nasal spray 1 spray into alternating nostrils once as needed (opioid overdose). PRN - Emergency use.      OXYGEN-AIR DELIVERY SYSTEMS MISC 5 L by Miscellaneous route. Currently using 3  L/min via [place]      pantoprazole (PROTONIX) 20 MG tablet TAKE 1 TABLET(20 MG) BY MOUTH DAILY 90 tablet 3    rosuvastatin (CRESTOR) 5 MG tablet TAKE 1 TABLET EVERY OTHER DAY 24 tablet 0    tafamidis (VYNDAMAX) 61 mg cap Take 1 capsule (61 mg) by mouth daily. 30 capsule 11    tiotropium bromide (SPIRIVA  RESPIMAT) 2.5 mcg/actuation inhalation mist Inhale 2 puffs daily. 4 g 11    torsemide (DEMADEX) 20 MG tablet Take 3 tablets (60 mg total) by mouth daily.      traZODone (DESYREL) 50 MG tablet Take 1 tablet (50 mg total) by mouth nightly as needed for sleep. 30 tablet 0    vitamin A-3,000 mcg RAE, 10,000 UNIT, 3,000 mcg RAE (10,000 UNIT) capsule Take 1 capsule (3,000 mcg of RAE total) by mouth daily.      vutrisiran (AMVUTTRA) 25 mg/0.5 mL injection Inject 0.5 mL (25 mg total) under the skin once. Every 12 Weeks       No current facility-administered medications for this visit.       PHYSICAL EXAM:  Directed, euthymic, calm, occasionally dozes off in visit, breathing comfortably on room air      The patient reports they are physically located in [place] and is currently: at home. I conducted a audio/video visit. I spent  2m 08s on the video call with the patient. I spent an additional 25 minutes on pre- and post-visit activities on the date of service .     Wanita Chamberlain, MD  [facility] Palliative Care at [facility]  This note was created using Dragon dictation software and while every attempt has been made to minimize errors, some may still be present.

## 2023-10-18 NOTE — Unmapped (Addendum)
Thank you for coming to palliative care clinic today. It was so lovely chatting with you! Here are the things we talked about:   -Increase trazodone from 50 mg (1 tablet) to 100 mg (2 tablets) at night 1 hour before time that you want to go to bed  -Hold the lorazepam while we are doing this  -I will set a reminder to check-in with yall next week via mychart and see how your sleep is doing.   -Please see below how to contact me for questions or concerns that come up.     Our next appointment will be Return in about 2 months (around 12/18/2023) for Recheck, Video Visit..   For virtual visits, if you have not received a phone call in 2-3 days after the visit, please call [phone] to schedule follow up.      Take care,   Barkley Boards, MD  Palliative Care at [facility]   _________________________    The following is information I give to all my patients at every visit:     How to contact me?  -You can contact me via MyChart message. It may take me up to 2-3 business days to respond, but I will get back to you. This is not meant for urgent issues.   -You can also call [facility] Palliative Care at [facility] at [phone] and they can relay a message to me.   -We are not available after normal business hours or on the weekend. If urgent needs, please reach out to your primary care provider or primary specialist provider.    What is palliative care?  Palliative care is specialized medical care for people with serious illnesses. This type of care is focused on providing you with relief from symptoms and stress of serious illness. We are also an extra layer of support. You can have quality of life while getting treatment for a serious illness. Palliative Care helps your medical providers talk to each other. At any stage of a serious illness, we???re here to help.  A team for all your concerns.     Advance directives and expressing your wishes:  You can have a say in your care. Treatments only work if they work for you. Talk to the people who matter most about the care you want. The more you speak up, the better your health care can be. Tell your doctors and other providers what matters most to you. You can get the care that???s right for you. We???ll figure this out together. Let???s make a plan for your care.    Here are resources to help start these conversations:   -Advance Care Planning - [facility] [place], [place] ([email or web address]   -[employer] ([employer]) - Offers free tools, guidance, and resources to begin talking with those who matter most about your and their wishes   -Prepare for Your Care ([email or web address]/en/[email or web address]-[email or web address]-state/[place]) - You can access a [employer] here, which is a legal document expressing your wishes and who your health care power of attorney would be if you can't make decisions for yourself. This must be notarized and signed by two witnesses to be considered a legal document.     Controlled substances:  If you are taking any controlled substances (such as anxiety, pain, or sleep medications), you must use them as the directions say to use them. Early refills cannot be provided and it would be inappropriate to obtain the medications from other doctors. We routinely use the [place] controlled substance database to monitor prescription drug use.

## 2023-10-18 NOTE — Unmapped (Signed)
This was a telehealth service where a resident was involved. As the attending physician, I spent 5 minutes in medical discussion with the patient via real-time audio and video, participating in the key portions of the service.I spent an additional 10 minutes on pre- and post-visit activities which were specific to the patient and included reviewing the patient???s medical records, lab results, imaging results, and other pertinent records. I reviewed the resident's note and I agree with the resident's findings and plan.

## 2023-10-18 NOTE — Unmapped (Addendum)
error 

## 2023-10-21 MED FILL — VYNDAMAX 61 MG CAPSULE: ORAL | 30 days supply | Qty: 30 | Fill #10

## 2023-10-22 MED ORDER — DICLOFENAC 1 % TOPICAL GEL
Freq: Four times a day (QID) | TOPICAL | 2 refills | 44 days | Status: CP | PRN
Start: 2023-10-22 — End: 2024-10-21

## 2023-10-25 DIAGNOSIS — Z09 Encounter for follow-up examination after completed treatment for conditions other than malignant neoplasm: Principal | ICD-10-CM

## 2023-10-25 MED ORDER — ROSUVASTATIN 5 MG TABLET
ORAL_TABLET | 0 refills | 0 days
Start: 2023-10-25 — End: ?

## 2023-10-25 MED FILL — MIDODRINE 5 MG TABLET: ORAL | 30 days supply | Qty: 90 | Fill #0

## 2023-10-25 MED FILL — ELIQUIS 2.5 MG TABLET: ORAL | 30 days supply | Qty: 60 | Fill #0

## 2023-10-26 MED ORDER — ROSUVASTATIN 5 MG TABLET
ORAL_TABLET | ORAL | 0 refills | 0.00000 days | Status: CP
Start: 2023-10-26 — End: ?

## 2023-10-27 ENCOUNTER — Ambulatory Visit: Admit: 2023-10-27 | Discharge: 2023-10-27 | Payer: MEDICARE | Attending: Adult Health | Primary: Adult Health

## 2023-10-27 DIAGNOSIS — I43 Cardiomyopathy in diseases classified elsewhere: Principal | ICD-10-CM

## 2023-10-27 DIAGNOSIS — N184 Chronic kidney disease, stage 4 (severe): Principal | ICD-10-CM

## 2023-10-27 DIAGNOSIS — E852 Heredofamilial amyloidosis, unspecified: Principal | ICD-10-CM

## 2023-10-27 DIAGNOSIS — E854 Organ-limited amyloidosis: Principal | ICD-10-CM

## 2023-10-27 DIAGNOSIS — I4819 Other persistent atrial fibrillation: Principal | ICD-10-CM

## 2023-10-27 DIAGNOSIS — R300 Dysuria: Principal | ICD-10-CM

## 2023-10-27 DIAGNOSIS — I5032 Chronic diastolic (congestive) heart failure: Principal | ICD-10-CM

## 2023-10-27 LAB — URINALYSIS WITH MICROSCOPY WITH CULTURE REFLEX PERFORMABLE
BILIRUBIN UA: NEGATIVE
GLUCOSE UA: NEGATIVE
HYALINE CASTS: 43 /LPF — ABNORMAL HIGH (ref 0–1)
KETONES UA: NEGATIVE
LEUKOCYTE ESTERASE UA: NEGATIVE
NITRITE UA: NEGATIVE
PH UA: 5 (ref 5.0–9.0)
PROTEIN UA: NEGATIVE
RBC UA: 5 /HPF — ABNORMAL HIGH (ref <=4)
SPECIFIC GRAVITY UA: 1.012 (ref 1.003–1.030)
SQUAMOUS EPITHELIAL: 5 /HPF (ref 0–5)
UROBILINOGEN UA: <2
WBC UA: 1 /HPF (ref 0–5)

## 2023-10-27 LAB — CBC
HEMATOCRIT: 36.4 % (ref 34.0–44.0)
HEMOGLOBIN: 11.6 g/dL (ref 11.3–14.9)
MEAN CORPUSCULAR HEMOGLOBIN CONC: 32 g/dL (ref 32.0–36.0)
MEAN CORPUSCULAR HEMOGLOBIN: 29.6 pg (ref 25.9–32.4)
MEAN CORPUSCULAR VOLUME: 92.4 fL (ref 77.6–95.7)
MEAN PLATELET VOLUME: 9 fL (ref 6.8–10.7)
PLATELET COUNT: 158 10*9/L (ref 150–450)
RED BLOOD CELL COUNT: 3.94 10*12/L — ABNORMAL LOW (ref 3.95–5.13)
RED CELL DISTRIBUTION WIDTH: 15 % (ref 12.2–15.2)
WBC ADJUSTED: 5.9 10*9/L (ref 3.6–11.2)

## 2023-10-27 LAB — BASIC METABOLIC PANEL
ANION GAP: 8 mmol/L (ref 5–14)
BLOOD UREA NITROGEN: 57 mg/dL — ABNORMAL HIGH (ref 9–23)
BUN / CREAT RATIO: 27
CALCIUM: 9.7 mg/dL (ref 8.7–10.4)
CHLORIDE: 100 mmol/L (ref 98–107)
CO2: 30.6 mmol/L (ref 20.0–31.0)
CREATININE: 2.13 mg/dL — ABNORMAL HIGH
EGFR CKD-EPI (2021) FEMALE: 23 mL/min/{1.73_m2} — ABNORMAL LOW (ref >=60)
GLUCOSE RANDOM: 127 mg/dL (ref 70–179)
POTASSIUM: 4 mmol/L (ref 3.4–4.8)
SODIUM: 139 mmol/L (ref 135–145)

## 2023-10-27 LAB — PRO-BNP: PRO-BNP: 9769 pg/mL — ABNORMAL HIGH (ref <=300.0)

## 2023-10-27 LAB — MAGNESIUM: MAGNESIUM: 2.1 mg/dL (ref 1.6–2.6)

## 2023-10-27 MED ORDER — TORSEMIDE 20 MG TABLET
ORAL_TABLET | ORAL | 11 refills | 0 days | Status: CP
Start: 2023-10-27 — End: 2024-10-26

## 2023-10-27 NOTE — Unmapped (Addendum)
Today,    MEDICATIONS:  We are changing your medications today.  - Increase torsemide to 40mg  TWICE a day until weight is <197lbs (195-197). If not improving, take metolazone x1 dose  Call if you have questions about your medications.    LABS:  We will call you if your labs need attention.    NEXT APPOINTMENT:  Return to clinic in 2 weeks  November 14th at 2pm      In general, to take care of your heart failure:  -Limit your fluid intake to 2 Liters (half-gallon) per day.    -Limit your salt intake to ideally 2-3 grams (2000-3000 mg) per day.  -Weigh yourself daily and record, and bring that weight diary to your next appointment.  (Weight gain of 2-3 pounds in 1 day typically means fluid weight.)    The medications for your heart are to help your heart and help you live longer.    Please contact us before stopping any of your heart medications.    Call the clinic at [phone] with questions.  Our clinic fax number is [phone].  If you need to reschedule future appointments, please call [phone] or [phone]  My office number (c/o De Burrs RN) is [phone] if you need further assistance.    Please do not send a MyChart message for potentially life-threatening symptoms.  Please call 911 for a true medical emergency.    To learn more about heart failure, please read [place]'s Learning to Live with Heart Failure - Available online at:  [email or web address] - open the window for Medical Management and click the link Living with Heart Failure   (Can search [facility] heart failure on the web to find the link.)

## 2023-10-27 NOTE — Unmapped (Signed)
[facility] HF Amyloid Clinic Note    Referring Provider: Montez Huber, AGNP  [address]  [address]  [place],  [place] [address]   Primary Provider: Artelia Laroche, MD  [address]  [place] [address]   Other Providers:  Barbara Barbara Huber, Barbara Barbara Huber    Reason for Visit:  Barbara Huber is a 82 y.o. female being seen for routine visit and continued care of cardiac amyloidosis .    Assessment & Plan:  1. 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 SPEP/UPEP/free light chains without concern. 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 Daughter is informed and has seen cardiology Barbara Huber).   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 2nd injection July 2024. Continue vitamin A supplementation. October injection postponed in light of recent admission.   - DC'd Jardiance 10mg  daily due to recurrent UTIs  - Weight 201 today, had been as low as 193-195 at home which is likely new dry weight.   - Increase torsemide to 80mg  daily until weight is <197lbs; take metolazone 5mg  PRN if weight is not trending down with increased torsemide  - Taking midodrine 5mg  TID with BP in 90-110s/60-70s at home. Tried stopping PM dose for concern it was contributing to insomnia, but no improvement in sleep. Continue to monitor BP and can increase to 10mg  for SBP <90   - Continue Tafamidis 61mg  daily   - spironolactone was stopped during admission for AKI  - She is not interested in CardioMEMS at this time.   1b. Goals of Care  - Had a discussion last visit w/ pt and daughter Barbara Huber regarding pt's goals of care.   We discussed role of palliative care (has seen Barbara Barbara Huber) and hospice (when appropriate). She may wish to return to hospital if needed, but likely not ICU level care or invasive monitoring (such as swan)    2. Persistent atrial fibrillation  - CHA2DS2-VASc score = 5 (1-gender, 2-Age, 1-DM, 1-HTN)  - We stopped Eliquis given recurrent anemia, shared decision making w/ pt and daughter. She was not interested in [place] at the time.   - Eliquis 2.5mg  BID restarted during hospitalization - will closely follow hemoglobin in follow up. Today Hgb is 11.6 and does appear to be very slowly trending down (13.7--> 13.6--> 11.9--> 11.6)  - On amiodarone 200mg  daily - in AF with rates in 100s-110s today despite amiodarone.   - Now that she is back on anticoagulation, could consider attempt at rhythm control with cardioversion - will discuss w/ Barbara Barbara Huber and EP. Otherwise not many option for rate control and/or rhythm control - she wouldn't want invasive procedures (like PPM and AVN ablation)  - amiodarone monitoring - last PFTs in 04/2021. TSH (09/23/23) /LFTs (08/31/23) stable at last check     3. [name]  - Cr baseline around 2.3-3.0  - Peaked at 5.4 during admission, 2.93 on discharge  - today Cr 2.13, K 4.0    4. COPD  - home O2 3-4L [place] - followed by pulm  - on Advair and Spiriva    5.  Iron deficiency anemia  - Finished IV Iron - ferritin 261. No longer anemic.  - As above, recheck hemoglobin at follow-up now that she is back on Eliquis   - labs next week w/ home health  Lab Results   Component Value Date    FERRITIN 336.3 (H) 08/19/2023     6. Anxiety  - previously on Zoloft  - Tried Remeron, but discontinued in setting of worsening HF symptoms    7. DM  - Off Insulin with A1c <7  - Having some higher BG readings at home >180 - ok to continue to monitor for now given last A1c result  Lab Results   Component Value Date    A1C 5.8 (H) 08/30/2023     8. Acute on chronic back pain  - oxycontin 10mg  BID changed to oxycodone 5mg  BID during admission  - pain is not well controlled - likely exacerbated by hospital stay/hospital bed. Not getting any sleep  - She has been referred for home PT  - Has follow up scheduled w/ pain doctor in November (Barbara Huber)   - Retry flexeril 5mg  nightly, which she tolerated previously during last flare up of her back pain (feb-May 2024)    Follow-up:  Return in about 2 weeks (around 11/10/2023) for Return HF.     History of Present Illness:  Barbara Huber is a 82 y.o. female with PMHx of COPD on 3L home [place], CHF, [name], [name], [name] She was initially admitted with decompensated [name] in 2021 in setting of  Septic Shock d/t Gangrenous Cholecystitis s/p cholecystectomy. She underwent further evaluation with PYP scan which showed TTR amyloid. She has since been followed in clinic for management of her [name]. In 2022, had numerous admissions for weakness/urosepsis, shortness of breath/orthopnea/weight gain, lightheadedness/SOB/Blurry vision found to be anemic.    Summary of most recent visits:  - 01/16/22 [facility]): Feeling well from a cardiac standpoint. Started Zoloft for anxiety  - 02/13/22 [facility]): Weight 210-211. Feeling well. Taking extra torsemide 20mg  about every other day, which was stopped for elevated Cr.   - 03/13/22 ([name]): Weight 201-216, took metolazone PRN, weight stabilized at 210lbs. Considering cardioMEMS.   - 05/13/22 ([name]): Had 3 iron infusions. Doing well w/ volume, weight 211-212lbs. Cooking, staying active around house.   - 05/21/22 ([name]): CP/SOB. Weight 217 from 213. She took metolazone x1 dose.   - 05/22/22 ([name]): Ed follow up. Back down to 213lbs. Still having CP w/ certain movement.   - 06/05/22 ([name]): Weight 214-216lb, should be 212lb.   - 06/09/22, 06/11/22, 06/22/22 ([name]): Weight down from 217 to 213lb, 211lb at home. Feeling well by 6/26.   - 07/15/22 ([name]): Switched inhaler back to Advair/Spiriva with improvement in breathing and O2 sat. Weight 207lbs, feeling more like herself.   - 08/19/22 ([name]): Feeling well. Weight 208-210lbs.   - 10/22/22 ([name]): Feeling well. Weight has been 210-214lbs.   - 12/04/22 ([name]): doing well overall, weight a bit up so taking extra torsemide.   - 02/10/23 ([name]): Weight 213-214lbs, feeling well, eating a lot. Had taken metolazone earlier in the week.   - 02/23/23 [facility]): Hospital follow up after mechanical fall with nondisplaced fractures of the left superior and inferior pubic rami. Taking torsemide 40mg , weight 211.   - 04/14/23 ([name]): Weight 210-212. 1st Amvuttra  - 07/14/23 [facility]): Weight 211-212 on torsemide 60mg  daily. 2nd Amvuttra.   - 08/18/23-08/23/23 (Admission): fatigue, weakness, DOE, recent prednisone course for  gout. Admitted to [facility], poor response to IV Lasix and was changed to IV Bumex 3mg  BID and metolazone. [name] held for low BP. Cr increased to 4.0 with diuresis. She went back to the ER on 8/24 for abdominal pain. She was felt to be euvolemic at a weight of 207lb and IV diuretics were held. She was discharged on torsemide 60mg  daily on 8/26.   - 08/27/23 [facility]): hospital follow up. Felt to be dry for weight down to 204 at home, Cr 3.4 BUN 96. Changed torsemide from 60 daily to 40/20.   - 08/30/23-09/02/23 (Admission): Admitted for fatigue and SOB. Diuresed  with improvement in dyspnea. Weight on discharge was 204.   - 09/06/23 [facility]): Hospital follow up. Her weight continued to decrease on her scale by about 1lb per day. Weight 201 that morning. She was a little lightheaded and felt weak. Continued on torsemide 60mg  daily.  Cr 2.61, pro-BNP 4575  - 09/08/23 ([name]/Phone): Weight 201, breathing well.   - 09/16/23 (Labs/[facility]): Weight 205, not peeing much with torsemide 60mg . Feeling full in belly. Cr 3.11,  pro-BNP 14492. Metolazone and extra 40 torsemide.   - 09/17/23 ([facility]): Weight 203, feeling better. Continue torsemide 60/40 through weekend.   - 09/23/23 ([name]): n 9/25 reporting hat she didn't pee much the night before and weight was 203lb that morning. Increased by 2 lbs overnight. Instructed to take metolazone 2.5mg  and extra 40 torsemide in the afternoon. Today her weight was up and so she requested a visit. She didn't urinate much and this morning was 205lbs. Extra torsemide 60mg  then metolazone in morning and return for diuresis clinic.   - 09/24/23 (Diuresis): IV Lasix. Arranged for direct admission.   - 9/27-10/5 (Admission): She initially underwent inotrope assisted diuresis with dopamine and furosemide gtt, which was stopped on 9/30 after episodes of hypotension requiring NE and midodrine. Norepinephrine stopped and pulmonary wedge pressure was 12 prior to pulling swan on on 10/1. Midodrine maintained to maximize preload. Furosemide gtt transitioned to spot IV lasix and then was transitioned to home torsemide 60mg  daily on 10/2. Palliative care was consulted and family decided on outpatient evaluation. She was discharged with a pro-BNP of 17K down from 26K and weight of 209lb. Discharged with new home regimen torsemide 60mg  daily, metolazone prn per clinic, midodrine 5mg  TID, Tafamidis 61mg  daily. She was continued on chronic home amiodarone 200 mg daily for atrial fibrillation and restarted on Eliquis 2.5mg  BID.   - 10/9/242 [facility]): hospital follow up. Weight 193-195. Torsemide 60mg g daily. In AF RVR by EKG. Amio increased to 200mg  BID.     Interval history  Today she presents for follow up. Since her last visit, she met with palliative care. They had discussions about her pain and insomnia. They tried holding evening midodrine to see if it was contributing to insomnia, but saw no improvement with holding it so went back on it.  They also stopped the evening amiodarone dose, for concern it was contributing to insomnia as well but didn't see improvement either. Her BP is running 90s-110s/60s-70s.  Weight has been increasing a bit since last visit, now up to 201lbs the past couple of days but had been 196-197 last week. HR is anywhere from 80s-100s at home but not checking it every day. Her energy is low and she feels tired. She is not SOB. Se does have a little swelling that worsens throughout the day. Per pt's daughter, she does seem to be improving compared to  last visit here. She feels pretty good when she gets up in the morning, but seems to lose her strength throughout the day.  Her daughter does think she is sleeping a little better.      Cardiovascular History & Procedures:    Cath / PCI:  none    CV Surgery:   none    EP Procedures and Devices:  none    Non-Invasive Evaluation(s):    Echo:  08/19/23  1. The left ventricle is normal in size with moderately increased wall  thickness.    2. 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    3. The left ventricular systolic function is hyperdynamic, LVEF is visually  estimated at >70%.    4. Mitral annular calcification is present (moderate).    5. The mitral valve leaflets are mildly thickened with normal leaflet  mobility.    6. The aortic valve is trileaflet with mildly thickened leaflets with normal  excursion.    7. The left atrium is moderately dilated in size.    8. The right ventricle is normal in size, with normal systolic function.    02/17/23    1. The left ventricle is normal in size with moderately increased wall  thickness.    2. The left ventricular systolic function is hyperdynamic, LVEF is visually  estimated at >70%.    3. The left atrium is moderately dilated in size.    4. The right ventricle is normal in size, with normal systolic function.    5. There is mild pulmonary hypertension.    12/09/21  Summary    1. The left ventricle is normal in size with severely increased wall  thickness.    2. The left ventricular systolic function is hyperdynamic, LVEF is visually  estimated at >70%.    3. There is grade III diastolic dysfunction (severely elevated filling  pressure).    4. The left atrium is moderately to severely dilated in size.    5. The right ventricle is mildly dilated in size, with normal systolic  function.    6. The right atrium is mildly dilated  in size.    7. IVC size and inspiratory change suggest elevated right atrial pressure.  (10-20 mmHg).    04/24/21   1. The left ventricle is normal in size with mildly increased wall  thickness.    2. The left ventricular systolic function is normal, LVEF is visually  estimated at > 55%.    3. There is grade II diastolic dysfunction (elevated filling pressure).    4. Mitral annular calcification is present (mild).    5. The left atrium is mildly dilated in size.    6. The right ventricle is mildly dilated in size, with low normal systolic  function.    7. IVC size and inspiratory change suggest elevated right atrial pressure.  (10-20 mmHg).    09/30/20  Summary    1. Limited study to assess systolic function.    2. IVC size and inspiratory change suggest normal right atrial pressure.  (0-5 mmHg).    3. The left ventricle is normal in size with mildly to moderately increased  wall thickness.    4. The left ventricular systolic function is normal with no obvious wall  motion abnormalities, LVEF is visually estimated at > 55%.    5. Mitral annular calcification is present.    6. The left atrium is mildly dilated in size.    7.  The right ventricle is upper normal in size, with reduced systolic  Function.    Cardiac CT/MRI/Nuclear Tests:   None    6 Minute Walk:  None    Cardiopulmonary Stress Tests:   None               Other Past Medical History:  See below for the complete EPIC list of past medical and surgical history.      Allergies:  Nitrofurantoin and Lipitor [atorvastatin]    Current Medications:  Current Outpatient Medications   Medication Sig Dispense Refill    acetaminophen (TYLENOL) 500 MG tablet Take 2 tablets (1,000 mg total) by mouth Three (3) times a day as needed for pain.      albuterol HFA 90 mcg/actuation inhaler Inhale 2 puffs every eight (8) hours as needed for wheezing. (Patient not taking: Reported on 10/06/2023)      amiodarone (PACERONE) 200 MG tablet Take 1 tablet (200 mg total) by mouth two (2) times a day. 60 tablet 11    apixaban (ELIQUIS) 2.5 mg Tab Take 1 tablet (2.5 mg total) by mouth two (2) times a day. 60 tablet 2    cranberry 500 mg cap Take 500 mg by mouth daily with evening meal.      cyclobenzaprine (FLEXERIL) 5 MG tablet Take 1 tablet (5 mg total) by mouth two (2) times a day as needed for muscle spasms. 60 tablet 0    diclofenac sodium (VOLTAREN) 1 % gel Apply 2 g topically four (4) times a day as needed (hand pain and neck pain). 350 g 2    ergocalciferol-1,250 mcg, 50,000 unit, (DRISDOL) 1,250 mcg (50,000 unit) capsule Take 1 capsule (1,250 mcg total) by mouth once a week. 12 capsule 3    estradioL (ESTRACE) 0.01 % (0.1 mg/gram) vaginal cream Insert 2 g into the vagina Two (2) times a week. 42.5 g 3    ferrous sulfate 325 (65 FE) MG EC tablet Take 1 tablet (325 mg total) by mouth Every Monday, Wednesday, and Friday. 36 tablet 3    fluticasone propion-salmeterol (ADVAIR HFA) 115-21 mcg/actuation inhaler Inhale 2 puffs two (2) times a day. 12 g 11    fosfomycin (MONUROL) 3 gram Pack Take 3 g by mouth once a week. 12 g 3    gabapentin (NEURONTIN) 100 MG capsule Take 1 capsule (100 mg total) by mouth two (2) times a day. 60 capsule 11    lidocaine (ASPERCREME) 4 % patch Place 1 patch on the skin daily. Remove after 12 hours. 30 patch 2    metOLazone (ZAROXOLYN) 5 MG tablet Take 1 tablet (5 mg total) by mouth daily as needed (up to 3x weekly when instructed by cardiology clinic). 12 tablet 1    midodrine (PROAMATINE) 5 MG tablet Take 1 tablet (5 mg total) by mouth three (3) times a day. 90 tablet 2    montelukast (SINGULAIR) 10 mg tablet Take 1 tablet (10 mg total) by mouth every morning. 90 tablet 2    NARCAN 4 mg/actuation nasal spray 1 spray into alternating nostrils once as needed (opioid overdose). PRN - Emergency use.      OXYGEN-AIR DELIVERY SYSTEMS MISC 5 L by Miscellaneous route. Currently using 3  L/min via [place]      pantoprazole (PROTONIX) 20 MG tablet TAKE 1 TABLET(20 MG) BY MOUTH DAILY 90 tablet 3    rosuvastatin (CRESTOR) 5 MG tablet TAKE 1 TABLET EVERY OTHER DAY 24 tablet 1    tafamidis (VYNDAMAX)  61 mg cap Take 1 capsule (61 mg) by mouth daily. 30 capsule 11    tiotropium bromide (SPIRIVA RESPIMAT) 2.5 mcg/actuation inhalation mist Inhale 2 puffs daily. 4 g 11    torsemide (DEMADEX) 20 MG tablet Take 3 tablets (60 mg total) by mouth daily. May also take 1 tablet (20 mg total) daily as needed (weight gain). 120 tablet 11    traZODone (DESYREL) 50 MG tablet Take 1 tablet (50 mg total) by mouth nightly as needed for sleep. 30 tablet 0    vitamin A-3,000 mcg RAE, 10,000 UNIT, 3,000 mcg RAE (10,000 UNIT) capsule Take 1 capsule (3,000 mcg of RAE total) by mouth daily.      vutrisiran (AMVUTTRA) 25 mg/0.5 mL injection Inject 0.5 mL (25 mg total) under the skin once. Every 12 Weeks       No current facility-administered medications for this visit.       Family History:  The patient's family history includes Cancer in her father; Hypertension in her mother; No Known Problems in her brother, brother, maternal aunt, maternal grandfather, maternal grandmother, maternal uncle, paternal aunt, paternal grandfather, paternal grandmother, paternal uncle, sister, sister, and another family member.    Social history:  She  reports that she quit smoking about 5 years ago. Her smoking use included cigarettes. She has never used smokeless tobacco. She reports that she does not drink alcohol and does not use drugs.    Review of Systems:  As per HPI.  Rest of the review of ten systems is negative or unremarkable except as stated above.    Physical Exam:  VITAL SIGNS:   Vitals:    10/27/23 1516   BP: 107/59   Pulse: 104   SpO2:      Wt Readings from Last 3 Encounters:   10/27/23 92.5 kg (204 lb)   10/15/23 88.9 kg (196 lb)   10/06/23 90.1 kg (198 lb 9.6 oz)      Today's Body mass index is 33.95 kg/m??.      CONSTITUTIONAL: well-appearing in no acute distress  EYES: Conjunctivae and sclerae clear and anicteric.  ENT: Benign.   CARDIOVASCULAR: JVP  3cm up neck with HOB at 90degrees, +AJR. Rate and rhythm are irregularly irregular.  There is no lifts or heaves.  Normal S1, S2. There is no murmur, gallops or rubs.  Radial and pedal pulses are 2+, bilaterally.   There is 1+ edema to ankles, left greater than right.   RESPIRATORY: Decreased breath sounds at bases.  There are no wheezes.  GASTROINTESTINAL: Soft, non-tender, with audible bowel sounds. Abdomen nondistended.  Liver is nonpalpable.  SKIN: No rashes, ecchymosis or petechiae.  Warm, well perfused.   MUSCULOSKELETAL:  no joint swelling   NEURO/PSYCH: Appropriate mood and affect. Alert and oriented to person, place, and time. No gross motor or sensory deficits evident.    Pertinent Laboratory Studies:   Office Visit on 10/27/2023   Component Date Value Ref Range Status    WBC 10/27/2023 5.9  3.6 - 11.2 10*9/L Final    RBC 10/27/2023 3.94 (L)  3.95 - 5.13 10*12/L Final    HGB 10/27/2023 11.6  11.3 - 14.9 g/dL Final    HCT 16/09/9603 36.4  34.0 - 44.0 % Final    MCV 10/27/2023 92.4  77.6 - 95.7 fL Final    MCH 10/27/2023 29.6  25.9 - 32.4 pg Final    MCHC 10/27/2023 32.0  32.0 - 36.0 g/dL Final    RDW 54/08/8118 15.0  12.2 - 15.2 % Final    MPV 10/27/2023 9.0  6.8 - 10.7 fL Final    Platelet 10/27/2023 158  150 - 450 10*9/L Final    Magnesium 10/27/2023 2.1  1.6 - 2.6 mg/dL Final    PRO-BNP 16/09/9603 9,769.0 (H)  <=300.0 pg/mL Final    Sodium 10/27/2023 139  135 - 145 mmol/L Final    Potassium 10/27/2023 4.0  3.4 - 4.8 mmol/L Final    Chloride 10/27/2023 100  98 - 107 mmol/L Final    CO2 10/27/2023 30.6  20.0 - 31.0 mmol/L Final    Anion Gap 10/27/2023 8  5 - 14 mmol/L Final    BUN 10/27/2023 57 (H)  9 - 23 mg/dL Final    Creatinine 54/08/8118 2.13 (H)  0.55 - 1.02 mg/dL Final    BUN/Creatinine Ratio 10/27/2023 27   Final    eGFR [name]-EPI (2021) Female 10/27/2023 23 (L)  >=60 mL/min/1.13m2 Final    Glucose 10/27/2023 127  70 - 179 mg/dL Final    Calcium 14/78/2956 9.7  8.7 - 10.4 mg/dL Final    Color, UA 21/30/8657 Light Yellow   Final    Clarity, UA 10/27/2023 Clear   Final    Specific Gravity, UA 10/27/2023 1.012  1.003 - 1.030 Final    pH, UA 10/27/2023 5.0  5.0 - 9.0 Final    Leukocyte Esterase, UA 10/27/2023 Negative  Negative Final    Nitrite, UA 10/27/2023 Negative  Negative Final    Protein, UA 10/27/2023 Negative  Negative Final    Glucose, UA 10/27/2023 Negative  Negative Final    Ketones, UA 10/27/2023 Negative  Negative Final    Urobilinogen, UA 10/27/2023 <2.0 mg/dL  <8.4 mg/dL Final    Bilirubin, UA 10/27/2023 Negative  Negative Final    Blood, UA 10/27/2023 Small (A)  Negative Final    RBC, UA 10/27/2023 5 (H)  <=4 /HPF Final    WBC, UA 10/27/2023 1  0 - 5 /HPF Final    Squam Epithel, UA 10/27/2023 5  0 - 5 /HPF Final    Bacteria, UA 10/27/2023 Occasional (A)  None Seen /HPF Final    Hyaline Casts, UA 10/27/2023 43 (H)  0 - 1 /LPF Final    Mucus, UA 10/27/2023 Rare (A)  None Seen /HPF Final   Appointment on 10/15/2023   Component Date Value Ref Range Status    Color, UA 10/15/2023 Light Yellow   Final    Clarity, UA 10/15/2023 Clear   Final    Specific Gravity, UA 10/15/2023 1.010  1.003 - 1.030 Final    pH, UA 10/15/2023 5.0  5.0 - 9.0 Final    Leukocyte Esterase, UA 10/15/2023 Negative  Negative Final    Nitrite, UA 10/15/2023 Negative  Negative Final    Protein, UA 10/15/2023 Negative  Negative Final    Glucose, UA 10/15/2023 Negative  Negative Final    Ketones, UA 10/15/2023 Negative  Negative Final    Urobilinogen, UA 10/15/2023 <2.0 mg/dL  <6.9 mg/dL Final    Bilirubin, UA 10/15/2023 Negative  Negative Final    Blood, UA 10/15/2023 Small (A)  Negative Final    RBC, UA 10/15/2023 1  <=4 /HPF Final    WBC, UA 10/15/2023 2  0 - 5 /HPF Final    Squam Epithel, UA 10/15/2023 2  0 - 5 /HPF Final    Bacteria, UA 10/15/2023 Few (A)  None Seen /HPF Final    Hyaline Casts, UA 10/15/2023 17 (H)  0 - 1 /LPF Final    Mucus, UA 10/15/2023 Occasional (A)  None Seen /HPF Final    Urine Culture, Comprehensive 10/15/2023 >100,000 CFU/mL Klebsiella pneumoniae (A)   Final   Office Visit on 10/06/2023   Component Date Value Ref Range Status    EKG Ventricular Rate 10/06/2023 112  BPM Final    EKG QRS Duration 10/06/2023 128  ms Final    EKG Q-T Interval 10/06/2023 400  ms Final    EKG QTC Calculation 10/06/2023 546  ms Final    EKG Calculated R Axis 10/06/2023 -85  degrees Final    EKG Calculated T Axis 10/06/2023 89  degrees Final    QTC Fredericia 10/06/2023 492  ms Final    Sodium 10/06/2023 130 (L)  135 - 145 mmol/L Final    Potassium 10/06/2023 3.1 (L)  3.4 - 4.8 mmol/L Final    Chloride 10/06/2023 94 (L)  98 - 107 mmol/L Final    CO2 10/06/2023 25.2  20.0 - 31.0 mmol/L Final    Anion Gap 10/06/2023 11  5 - 14 mmol/L Final    BUN 10/06/2023 110 (H)  9 - 23 mg/dL Final    Creatinine 02/72/5366 2.37 (H)  0.55 - 1.02 mg/dL Final    BUN/Creatinine Ratio 10/06/2023 46   Final    eGFR [name]-EPI (2021) Female 10/06/2023 20 (L)  >=60 mL/min/1.30m2 Final    Glucose 10/06/2023 244 (H)  70 - 179 mg/dL Final    Calcium 44/02/4741 9.9  8.7 - 10.4 mg/dL Final    PRO-BNP 59/56/3875 14,251.0 (H)  <=300.0 pg/mL Final    Magnesium 10/06/2023 2.1  1.6 - 2.6 mg/dL Final   No results displayed because visit has over 200 results.      Office Visit on 09/23/2023   Component Date Value Ref Range Status    Sodium 09/23/2023 131 (L)  135 - 145 mmol/L Final    Potassium 09/23/2023 4.7  3.4 - 4.8 mmol/L Final    Chloride 09/23/2023 98  98 - 107 mmol/L Final    CO2 09/23/2023 23.4  20.0 - 31.0 mmol/L Final    Anion Gap 09/23/2023 10  5 - 14 mmol/L Final    BUN 09/23/2023 100 (H)  9 - 23 mg/dL Final    Creatinine 64/33/2951 4.46 (H)  0.55 - 1.02 mg/dL Final    BUN/Creatinine Ratio 09/23/2023 22   Final    eGFR [name]-EPI (2021) Female 09/23/2023 9 (L)  >=60 mL/min/1.54m2 Final    Glucose 09/23/2023 190 (H)  70 - 179 mg/dL Final    Calcium 88/41/6606 9.5  8.7 - 10.4 mg/dL Final    PRO-BNP 30/16/0109 17,133.0 (H)  <=300.0 pg/mL Final    Magnesium 09/23/2023 2.4  1.6 - 2.6 mg/dL Final    Free T4 32/35/5732 1.39  0.89 - 1.76 ng/dL Final    TSH 20/25/4270 2.346  0.550 - 4.780 uIU/mL Final   Appointment on 09/16/2023   Component Date Value Ref Range Status    Magnesium 09/16/2023 2.4  1.6 - 2.6 mg/dL Final    Sodium 62/37/6283 136  135 - 145 mmol/L Final    Potassium 09/16/2023 4.7  3.4 - 4.8 mmol/L Final    Chloride 09/16/2023 103  98 - 107 mmol/L Final    CO2 09/16/2023 22.1  20.0 - 31.0 mmol/L Final    Anion Gap 09/16/2023 11  5 - 14 mmol/L Final    BUN 09/16/2023 89 (H)  9 - 23 mg/dL Final  Creatinine 09/16/2023 3.11 (H)  0.55 - 1.02 mg/dL Final    BUN/Creatinine Ratio 09/16/2023 29   Final    eGFR [name]-EPI (2021) Female 09/16/2023 15 (L)  >=60 mL/min/1.65m2 Final    Glucose 09/16/2023 172  70 - 179 mg/dL Final    Calcium 16/09/9603 9.4  8.7 - 10.4 mg/dL Final    PRO-BNP 54/08/8118 14,492.0 (H)  <=300.0 pg/mL Final   Office Visit on 09/06/2023   Component Date Value Ref Range Status    EKG Ventricular Rate 09/06/2023 69  BPM Final    EKG Atrial Rate 09/06/2023 69  BPM Final    EKG P-R Interval 09/06/2023 194  ms Final    EKG QRS Duration 09/06/2023 114  ms Final    EKG Q-T Interval 09/06/2023 464  ms Final    EKG QTC Calculation 09/06/2023 497  ms Final    EKG Calculated P Axis 09/06/2023 75  degrees Final    EKG Calculated R Axis 09/06/2023 -65  degrees Final    EKG Calculated T Axis 09/06/2023 62  degrees Final    QTC Fredericia 09/06/2023 486  ms Final    Sodium 09/06/2023 136  135 - 145 mmol/L Final    Potassium 09/06/2023 4.3  3.4 - 4.8 mmol/L Final    Chloride 09/06/2023 100  98 - 107 mmol/L Final    CO2 09/06/2023 25.9  20.0 - 31.0 mmol/L Final    Anion Gap 09/06/2023 10  5 - 14 mmol/L Final    BUN 09/06/2023 84 (H)  9 - 23 mg/dL Final    Creatinine 14/78/2956 2.61 (H)  0.55 - 1.02 mg/dL Final BUN/Creatinine Ratio 09/06/2023 32   Final    eGFR [name]-EPI (2021) Female 09/06/2023 18 (L)  >=60 mL/min/1.23m2 Final    Glucose 09/06/2023 248 (H)  70 - 179 mg/dL Final    Calcium 21/30/8657 10.0  8.7 - 10.4 mg/dL Final    Magnesium 84/69/6295 2.3  1.6 - 2.6 mg/dL Final    PRO-BNP 28/41/3244 4,575.0 (H)  <=300.0 pg/mL Final   Admission on 08/30/2023, Discharged on 09/02/2023   Component Date Value Ref Range Status    Sodium 08/30/2023 134 (L)  135 - 145 mmol/L Final    Potassium 08/30/2023 4.4  3.4 - 4.8 mmol/L Final    Chloride 08/30/2023 99  98 - 107 mmol/L Final    CO2 08/30/2023 24.3  20.0 - 31.0 mmol/L Final    Anion Gap 08/30/2023 11  5 - 14 mmol/L Final    BUN 08/30/2023 103 (H)  9 - 23 mg/dL Final    Creatinine 12/30/7251 3.06 (H)  0.55 - 1.02 mg/dL Final    BUN/Creatinine Ratio 08/30/2023 34   Final    eGFR [name]-EPI (2021) Female 08/30/2023 15 (L)  >=60 mL/min/1.40m2 Final    Glucose 08/30/2023 260 (H)  70 - 179 mg/dL Final    Calcium 66/44/0347 9.5  8.7 - 10.4 mg/dL Final    Albumin 42/59/5638 3.4  3.4 - 5.0 g/dL Final    Total Protein 08/30/2023 7.7  5.7 - 8.2 g/dL Final    Total Bilirubin 08/30/2023 0.4  0.3 - 1.2 mg/dL Final    AST 75/64/3329 31  <=34 U/L Final    ALT 08/30/2023 15  10 - 49 U/L Final    Alkaline Phosphatase 08/30/2023 197 (H)  46 - 116 U/L Final    PT 08/30/2023 13.2 (H)  9.9 - 12.6 sec Final    INR 08/30/2023 1.20   Final  hsTroponin I 08/30/2023 164 (HH)  <=34 ng/L Final    EKG Ventricular Rate 08/30/2023 81  BPM Final    EKG Atrial Rate 08/30/2023 83  BPM Final    EKG QRS Duration 08/30/2023 118  ms Final    EKG Q-T Interval 08/30/2023 408  ms Final    EKG QTC Calculation 08/30/2023 473  ms Final    EKG Calculated R Axis 08/30/2023 268  degrees Final    EKG Calculated T Axis 08/30/2023 63  degrees Final    QTC Fredericia 08/30/2023 450  ms Final    WBC 08/30/2023 9.0  3.6 - 11.2 10*9/L Final    RBC 08/30/2023 4.24  3.95 - 5.13 10*12/L Final    HGB 08/30/2023 12.8  11.3 - 14.9 g/dL Final    HCT 82/95/6213 39.1  34.0 - 44.0 % Final    MCV 08/30/2023 92.3  77.6 - 95.7 fL Final    MCH 08/30/2023 30.2  25.9 - 32.4 pg Final    MCHC 08/30/2023 32.7  32.0 - 36.0 g/dL Final    RDW 08/65/7846 14.5  12.2 - 15.2 % Final    MPV 08/30/2023 8.6  6.8 - 10.7 fL Final    Platelet 08/30/2023 320  150 - 450 10*9/L Final    nRBC 08/30/2023 0  <=4 /100 WBCs Final    Neutrophils % 08/30/2023 79.1  % Final    Lymphocytes % 08/30/2023 6.8  % Final    Monocytes % 08/30/2023 12.3  % Final    Eosinophils % 08/30/2023 1.0  % Final    Basophils % 08/30/2023 0.8  % Final    Absolute Neutrophils 08/30/2023 7.1  1.8 - 7.8 10*9/L Final    Absolute Lymphocytes 08/30/2023 0.6 (L)  1.1 - 3.6 10*9/L Final    Absolute Monocytes 08/30/2023 1.1 (H)  0.3 - 0.8 10*9/L Final    Absolute Eosinophils 08/30/2023 0.1  0.0 - 0.5 10*9/L Final    Absolute Basophils 08/30/2023 0.1  0.0 - 0.1 10*9/L Final    hsTroponin I 08/30/2023 162 (HH)  <=34 ng/L Final    delta hsTroponin I 08/30/2023 2  <=7 ng/L Final    Specimen Source 08/30/2023 Venous   Final    FIO2 Venous 08/30/2023 Not Specified   Final    pH, Venous 08/30/2023 7.38  7.32 - 7.43 Final    pCO2, Ven 08/30/2023 48  40 - 60 mm Hg Final    pO2, Ven 08/30/2023 41 (H)  35 - 40 mm Hg Final    HCO3, Ven 08/30/2023 26  22 - 27 mmol/L Final    Base Excess, Ven 08/30/2023 2.9 (H)  -2.0 - 2.0 Final    O2 Saturation, Venous 08/30/2023 73.3  40.0 - 85.0 % Final    BNP 08/30/2023 450.68 (H)  <=100 pg/mL Final    SARS-CoV-2 PCR 08/30/2023 Negative  Negative Final    Influenza A 08/30/2023 Negative  Negative Final    Influenza B 08/30/2023 Negative  Negative Final    RSV 08/30/2023 Negative  Negative Final    hsTroponin I 08/31/2023 173 (HH)  <=34 ng/L Final    delta hsTroponin I 08/31/2023 11 (H)  <=7 ng/L Final    Color, UA 08/31/2023 Light Yellow   Final    Clarity, UA 08/31/2023 Clear   Final    Specific Gravity, UA 08/31/2023 1.013  1.003 - 1.030 Final    pH, UA 08/31/2023 5.0  5.0 - 9.0 Final    Leukocyte Esterase, UA  08/31/2023 Small (A)  Negative Final    Nitrite, UA 08/31/2023 Negative  Negative Final    Protein, UA 08/31/2023 Negative  Negative Final    Glucose, UA 08/31/2023 Negative  Negative Final    Ketones, UA 08/31/2023 Negative  Negative Final    Urobilinogen, UA 08/31/2023 <2.0 mg/dL  <1.1 mg/dL Final    Bilirubin, UA 08/31/2023 Negative  Negative Final    Blood, UA 08/31/2023 Trace (A)  Negative Final    RBC, UA 08/31/2023 <1  <=4 /HPF Final    WBC, UA 08/31/2023 3  0 - 5 /HPF Final    Squam Epithel, UA 08/31/2023 2  0 - 5 /HPF Final    Bacteria, UA 08/31/2023 Moderate (A)  None Seen /HPF Final    Hyaline Casts, UA 08/31/2023 6 (H)  0 - 1 /LPF Final    Mucus, UA 08/31/2023 Rare (A)  None Seen /HPF Final    Creat U 08/31/2023 75.3  Undefined mg/dL Final    Protein, Ur 91/47/8295 7.4  Undefined mg/dL Final    Protein/Creatinine Ratio, Urine 08/31/2023 0.098  Undefined Final    WBC 08/31/2023 8.2  3.6 - 11.2 10*9/L Final    RBC 08/31/2023 3.95  3.95 - 5.13 10*12/L Final    HGB 08/31/2023 11.9  11.3 - 14.9 g/dL Final    HCT 62/13/0865 36.5  34.0 - 44.0 % Final    MCV 08/31/2023 92.5  77.6 - 95.7 fL Final    MCH 08/31/2023 30.1  25.9 - 32.4 pg Final    MCHC 08/31/2023 32.6  32.0 - 36.0 g/dL Final    RDW 78/46/9629 14.8  12.2 - 15.2 % Final    MPV 08/31/2023 8.4  6.8 - 10.7 fL Final    Platelet 08/31/2023 269  150 - 450 10*9/L Final    Magnesium 08/31/2023 2.3  1.6 - 2.6 mg/dL Final    Phosphorus 52/84/1324 4.5  2.4 - 5.1 mg/dL Final    Sodium 40/09/2724 140  135 - 145 mmol/L Final    Potassium 08/31/2023 3.9  3.4 - 4.8 mmol/L Final    Chloride 08/31/2023 101  98 - 107 mmol/L Final    CO2 08/31/2023 26.2  20.0 - 31.0 mmol/L Final    Anion Gap 08/31/2023 13  5 - 14 mmol/L Final    BUN 08/31/2023 99 (H)  9 - 23 mg/dL Final    Creatinine 36/64/4034 2.82 (H)  0.55 - 1.02 mg/dL Final    BUN/Creatinine Ratio 08/31/2023 35   Final    eGFR [name]-EPI (2021) Female 08/31/2023 16 (L)  >=60 mL/min/1.73m2 Final    Glucose 08/31/2023 132  70 - 179 mg/dL Final    Calcium 74/25/9563 9.4  8.7 - 10.4 mg/dL Final    Albumin 87/56/4332 3.0 (L)  3.4 - 5.0 g/dL Final    Total Protein 08/31/2023 6.9  5.7 - 8.2 g/dL Final    Total Bilirubin 08/31/2023 0.5  0.3 - 1.2 mg/dL Final    AST 95/18/8416 28  <=34 U/L Final    ALT 08/31/2023 10  10 - 49 U/L Final    Alkaline Phosphatase 08/31/2023 174 (H)  46 - 116 U/L Final    Hemoglobin A1C 08/30/2023 5.8 (H)  4.8 - 5.6 % Final    Estimated Average Glucose 08/30/2023 120  mg/dL Final    TSH 60/63/0160 0.963  0.550 - 4.780 uIU/mL Final    T3, Free 08/31/2023 1.77 (L)  2.30 - 4.20 pg/mL Final    Free  T4 08/31/2023 1.79 (H)  0.89 - 1.76 ng/dL Final    Cortisol 84/69/6295 41.5  See Comment ug/dL Final    Glucose, POC 28/41/3244 150  70 - 179 mg/dL Final    Glucose, POC 12/30/7251 220 (H)  70 - 179 mg/dL Final    Glucose, POC 66/44/0347 172  70 - 179 mg/dL Final    Glucose, POC 42/59/5638 168  70 - 179 mg/dL Final    Sodium 75/64/3329 138  135 - 145 mmol/L Final    Potassium 09/01/2023 4.5  3.4 - 4.8 mmol/L Final    Chloride 09/01/2023 101  98 - 107 mmol/L Final    CO2 09/01/2023 28.8  20.0 - 31.0 mmol/L Final    Anion Gap 09/01/2023 8  5 - 14 mmol/L Final    BUN 09/01/2023 97 (H)  9 - 23 mg/dL Final    Creatinine 51/88/4166 2.87 (H)  0.55 - 1.02 mg/dL Final    BUN/Creatinine Ratio 09/01/2023 34   Final    eGFR [name]-EPI (2021) Female 09/01/2023 16 (L)  >=60 mL/min/1.83m2 Final    Glucose 09/01/2023 132  70 - 179 mg/dL Final    Calcium 06/26/1600 9.5  8.7 - 10.4 mg/dL Final    Magnesium 09/32/3557 2.3  1.6 - 2.6 mg/dL Final    WBC 32/20/2542 9.6  3.6 - 11.2 10*9/L Final    RBC 09/01/2023 3.97  3.95 - 5.13 10*12/L Final    HGB 09/01/2023 11.8  11.3 - 14.9 g/dL Final    HCT 70/62/3762 36.6  34.0 - 44.0 % Final    MCV 09/01/2023 92.2  77.6 - 95.7 fL Final    MCH 09/01/2023 29.7  25.9 - 32.4 pg Final    MCHC 09/01/2023 32.2  32.0 - 36.0 g/dL Final    RDW 83/15/1761 14.8  12.2 - 15.2 % Final MPV 09/01/2023 8.5  6.8 - 10.7 fL Final    Platelet 09/01/2023 289  150 - 450 10*9/L Final    Glucose, POC 09/01/2023 144  70 - 179 mg/dL Final    Glucose, POC 60/73/7106 240 (H)  70 - 179 mg/dL Final    Glucose, POC 26/94/8546 142  70 - 179 mg/dL Final    Glucose, POC 27/02/5008 190 (H)  70 - 179 mg/dL Final    Sodium 38/18/2993 139  135 - 145 mmol/L Final    Potassium 09/02/2023 4.4  3.4 - 4.8 mmol/L Final    Chloride 09/02/2023 101  98 - 107 mmol/L Final    CO2 09/02/2023 26.1  20.0 - 31.0 mmol/L Final    Anion Gap 09/02/2023 12  5 - 14 mmol/L Final    BUN 09/02/2023 89 (H)  9 - 23 mg/dL Final    Creatinine 71/69/6789 3.20 (H)  0.55 - 1.02 mg/dL Final    BUN/Creatinine Ratio 09/02/2023 28   Final    eGFR [name]-EPI (2021) Female 09/02/2023 14 (L)  >=60 mL/min/1.60m2 Final    Glucose 09/02/2023 125  70 - 179 mg/dL Final    Calcium 38/09/1750 9.7  8.7 - 10.4 mg/dL Final    Magnesium 02/58/5277 2.1  1.6 - 2.6 mg/dL Final    Glucose, POC 82/42/3536 153  70 - 179 mg/dL Final    Glucose, POC 14/43/1540 251 (H)  70 - 179 mg/dL Final   Office Visit on 08/27/2023   Component Date Value Ref Range Status    BNP 08/27/2023 324.32 (H)  <=100 pg/mL Final    Sodium 08/27/2023 133 (L)  135 -  145 mmol/L Final    Potassium 08/27/2023 4.3  3.4 - 4.8 mmol/L Final    Chloride 08/27/2023 95 (L)  98 - 107 mmol/L Final    CO2 08/27/2023 28.2  20.0 - 31.0 mmol/L Final    Anion Gap 08/27/2023 10  5 - 14 mmol/L Final    BUN 08/27/2023 96 (H)  9 - 23 mg/dL Final    Creatinine 45/40/9811 3.41 (H)  0.55 - 1.02 mg/dL Final    BUN/Creatinine Ratio 08/27/2023 28   Final    eGFR [name]-EPI (2021) Female 08/27/2023 13 (L)  >=60 mL/min/1.73m2 Final    Glucose 08/27/2023 250 (H)  70 - 179 mg/dL Final    Calcium 91/47/8295 9.6  8.7 - 10.4 mg/dL Final    Magnesium 62/13/0865 2.2  1.6 - 2.6 mg/dL Final   No results displayed because visit has over 200 results.      There may be more visits with results that are not included.       Lab Results   Component Value Date    PRO-BNP 9,769.0 (H) 10/27/2023    PRO-BNP 14,251.0 (H) 10/06/2023    PRO-BNP 17,737.0 (H) 09/29/2023    Creatinine 2.13 (H) 10/27/2023    Creatinine 2.37 (H) 10/06/2023    Creatinine 0.79 01/06/2011    BUN 57 (H) 10/27/2023    BUN 110 (H) 10/06/2023    BUN 20 01/06/2011    Sodium 139 10/27/2023    Sodium 140 01/06/2011    Potassium 4.0 10/27/2023    Potassium 4.1 01/06/2011    CO2 30.6 10/27/2023    CO2 30 01/06/2011    Magnesium 2.1 10/27/2023    Magnesium 2.0 01/06/2011    Total Bilirubin 1.3 (H) 09/29/2023    INR 1.04 09/24/2023    INR 1.0 01/06/2011       Lab Results   Component Value Date    Digoxin Level 0.9 10/01/2020       Lab Results   Component Value Date    TSH 2.346 09/23/2023    Cholesterol 95 12/06/2021    Triglycerides 66 12/06/2021    HDL 42 12/06/2021    Non-HDL Cholesterol 53 (L) 12/06/2021    LDL Calculated 40 12/06/2021       Lab Results   Component Value Date    WBC 5.9 10/27/2023    WBC 12.5 (H) 01/06/2011    HGB 11.6 10/27/2023    HGB 12.9 01/06/2011    HCT 36.4 10/27/2023    HCT 40.8 01/06/2011    Platelet 158 10/27/2023    Platelet 260 01/06/2011       Pertinent Test Results from Today:  None    Other pertinent records were reviewed.    The following are further history from the patient's EPIC record for reference:     Past Medical History:   Diagnosis Date    Acute kidney injury superimposed on chronic kidney disease (CMS-HCC) 10/11/2020    Acute on chronic diastolic (congestive) heart failure (CMS-HCC) 08/23/2020    Acute on chronic diastolic congestive heart failure (CMS-HCC)     AKI (acute kidney injury) (CMS-HCC) 04/14/2015    Lab Results  Component  Value  Date     CREATININE  1.90 (H)  06/12/2021     Had a bump in her creatinine when she was taking her diuretics every day.  She is currently taking 40 mg daily of torsemide and 50 mg of spironolactone.  Her volume status is fragile.  Previously when she  stopped her diuretic she becomes short of breath.  Plan: We will check her BMP today.  We will likely have to go to 40    Anemia 08/22/2019    Iron deficiency anemia          Lab Results      Component    Value    Date           WBC    9.2    03/21/2021           RBC    3.67 (L)    03/21/2021           HGB    9.9 (L)    03/21/2021           HCT    30.7 (L)    03/21/2021           MCV    83.7    03/21/2021           MCH    26.9    03/21/2021           MCHC    32.1    03/21/2021           RDW    21.9 (H)    03/21/2021           PLT    318     Arthritis     Calculus of kidney     Calculus of ureter     CHF (congestive heart failure) (CMS-HCC)     Chronic atrial fibrillation (CMS-HCC) 07/20/2019    COPD (chronic obstructive pulmonary disease) (CMS-HCC)     Diabetes (CMS-HCC)     Gangrenous cholecystitis 10/11/2020    Generalized edema  06/17/2021    GERD (gastroesophageal reflux disease)     Hydronephrosis     Hypertension     Hyponatremia 10/11/2020    Intermediate coronary syndrome (CMS-HCC) 03/13/2014    Lower extremity edema 09/28/2020    Lumbar stenosis     Microscopic hematuria     Nausea alone     Nephrolithiasis 04/17/2016    Neuropathy     Nocturia     Nocturia 07/01/2017    Other chronic cystitis     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  See plans for anemia, AKI  We are also tapering her gabapentin.  Currently on 300 mg 3 times daily.  We will decrease it to twice daily, and then nightly.  She states she is not having any recurrence of her pain.    Pulmonary hypertension (CMS-HCC)     Renal colic     Shortness of breath 04/28/2020    Sleep apnea     Unstable angina pectoris (CMS-HCC) 03/13/2014       Past Surgical History:   Procedure Laterality Date    BACK SURGERY  1995    CARPAL TUNNEL RELEASE Left 2014    HYSTERECTOMY  1971    IR INSERT CHOLECYSTOSMY TUBE PERCUTANEOUS  10/02/2020    IR INSERT CHOLECYSTOSMY TUBE PERCUTANEOUS 10/02/2020 Braulio Conte, MD IMG VIR [facility]    LUMBAR DISC SURGERY      PR REMOVAL GALLBLADDER N/A 10/06/2020    Procedure: CHOLECYSTECTOMY;  Surgeon: Katherina Mires, MD;  Location: MAIN OR [facility];  Service: Trauma    PR RIGHT HEART CATH O2 SATURATION & CARDIAC OUTPUT N/A 09/30/2020    Procedure: Right Heart Catheterization;  Surgeon: Neal Dy, MD;  Location:  [facility] CATH;  Service: Cardiology    PR RIGHT HEART CATH O2 SATURATION & CARDIAC OUTPUT N/A 01/09/2021    Procedure: Right Heart Catheterization;  Surgeon: Lesle Reek, MD;  Location: [facility] CATH;  Service: Cardiology

## 2023-10-28 NOTE — Unmapped (Signed)
Patient requires bariatric commode related to her body proportions. A regular commode cannot accommodate her body habitus/shape. Patient seen face-to-face by me yesterday (10/27/23). Will put in request for bariatric commode to her current medical supplier.     Pricilla Loveless Excell Seltzer, MSN, AGNP-C  Cardiology Services - [name]  [facility]   [address], [place], [place] [address]  P: [phone] F: [phone]  [name]

## 2023-11-01 ENCOUNTER — Ambulatory Visit: Admit: 2023-11-01 | Discharge: 2023-11-02 | Payer: MEDICARE

## 2023-11-01 LAB — COMPREHENSIVE METABOLIC PANEL
ALBUMIN: 3.2 g/dL — ABNORMAL LOW (ref 3.4–5.0)
ALKALINE PHOSPHATASE: 164 U/L — ABNORMAL HIGH (ref 46–116)
ALT (SGPT): 8 U/L — ABNORMAL LOW (ref 10–49)
ANION GAP: 5 mmol/L (ref 5–14)
AST (SGOT): 24 U/L (ref <=34)
BILIRUBIN TOTAL: 0.7 mg/dL (ref 0.3–1.2)
BLOOD UREA NITROGEN: 40 mg/dL — ABNORMAL HIGH (ref 9–23)
BUN / CREAT RATIO: 19
CALCIUM: 8.8 mg/dL (ref 8.7–10.4)
CHLORIDE: 99 mmol/L (ref 98–107)
CO2: 30 mmol/L (ref 20.0–31.0)
CREATININE: 2.07 mg/dL — ABNORMAL HIGH
EGFR CKD-EPI (2021) FEMALE: 24 mL/min/{1.73_m2} — ABNORMAL LOW (ref >=60)
GLUCOSE RANDOM: 152 mg/dL (ref 70–179)
POTASSIUM: 3.4 mmol/L (ref 3.4–4.8)
PROTEIN TOTAL: 6.6 g/dL (ref 5.7–8.2)
SODIUM: 134 mmol/L — ABNORMAL LOW (ref 135–145)

## 2023-11-01 LAB — CBC W/ AUTO DIFF
BASOPHILS ABSOLUTE COUNT: 0.1 10*9/L (ref 0.0–0.1)
BASOPHILS RELATIVE PERCENT: 1 %
EOSINOPHILS ABSOLUTE COUNT: 0.1 10*9/L (ref 0.0–0.5)
EOSINOPHILS RELATIVE PERCENT: 1.7 %
HEMATOCRIT: 35.5 % (ref 34.0–44.0)
HEMOGLOBIN: 11.5 g/dL (ref 11.3–14.9)
LYMPHOCYTES ABSOLUTE COUNT: 1 10*9/L — ABNORMAL LOW (ref 1.1–3.6)
LYMPHOCYTES RELATIVE PERCENT: 13.3 %
MEAN CORPUSCULAR HEMOGLOBIN CONC: 32.3 g/dL (ref 32.0–36.0)
MEAN CORPUSCULAR HEMOGLOBIN: 29.7 pg (ref 25.9–32.4)
MEAN CORPUSCULAR VOLUME: 92 fL (ref 77.6–95.7)
MEAN PLATELET VOLUME: 9 fL (ref 6.8–10.7)
MONOCYTES ABSOLUTE COUNT: 0.8 10*9/L (ref 0.3–0.8)
MONOCYTES RELATIVE PERCENT: 11 %
NEUTROPHILS ABSOLUTE COUNT: 5.2 10*9/L (ref 1.8–7.8)
NEUTROPHILS RELATIVE PERCENT: 73 %
PLATELET COUNT: 154 10*9/L (ref 150–450)
RED BLOOD CELL COUNT: 3.86 10*12/L — ABNORMAL LOW (ref 3.95–5.13)
RED CELL DISTRIBUTION WIDTH: 14.1 % (ref 12.2–15.2)
WBC ADJUSTED: 7.2 10*9/L (ref 3.6–11.2)

## 2023-11-01 LAB — BLOOD GAS, VENOUS
BASE EXCESS VENOUS: 5.6 — ABNORMAL HIGH (ref -2.0–2.0)
HCO3 VENOUS: 33 mmol/L — ABNORMAL HIGH (ref 22–27)
O2 SATURATION VENOUS: 64.1 % (ref 40.0–85.0)
PCO2 VENOUS: 57 mmHg (ref 40–60)
PH VENOUS: 7.37 (ref 7.32–7.43)
PO2 VENOUS: 35 mmHg (ref 30–55)

## 2023-11-01 LAB — HIGH SENSITIVITY TROPONIN I - SINGLE: HIGH SENSITIVITY TROPONIN I: 58 ng/L (ref <=34)

## 2023-11-01 LAB — HIGH SENSITIVITY TROPONIN I - 2H/6H SERIAL
HIGH SENSITIVITY TROPONIN - DELTA (0-2H): 47 ng/L (ref <=7)
HIGH-SENSITIVITY TROPONIN I - 2 HOUR: 105 ng/L (ref <=34)

## 2023-11-01 LAB — HIGH SENSITIVITY TROPONIN I - 4 HOUR SERIAL
HIGH SENSITIVITY TROPONIN - DELTA (2-6H): 94 ng/L (ref <=7)
HIGH-SENSITIVITY TROPONIN I - 6 HOUR: 199 ng/L (ref <=34)

## 2023-11-01 LAB — LACTATE, VENOUS, WHOLE BLOOD: LACTATE BLOOD VENOUS: 1.9 mmol/L — ABNORMAL HIGH (ref 0.5–1.8)

## 2023-11-01 LAB — PRO-BNP: PRO-BNP: 6696 pg/mL — ABNORMAL HIGH (ref <=300.0)

## 2023-11-01 MED ORDER — TRAZODONE 50 MG TABLET
ORAL_TABLET | Freq: Every evening | ORAL | 0 refills | 30 days | Status: CP | PRN
Start: 2023-11-01 — End: 2023-12-01

## 2023-11-01 MED ADMIN — midodrine (PROAMATINE) tablet 5 mg: 5 mg | ORAL | @ 22:00:00 | Stop: 2023-11-01

## 2023-11-01 MED ADMIN — torsemide (DEMADEX) tablet 60 mg: 60 mg | ORAL | @ 22:00:00 | Stop: 2023-11-01

## 2023-11-01 NOTE — Unmapped (Signed)
Bed: 16B  Expected date:   Expected time:   Means of arrival:   Comments:  EMS

## 2023-11-01 NOTE — Unmapped (Signed)
BIB [facility] c/o hypoxia. Initially 68% on RA, now 98 on 10/L NRB. Endorsing dizziness. Normally wears 3/L [place].     Also Afib between 80-130s w EMS.

## 2023-11-01 NOTE — Unmapped (Signed)
[facility]  Emergency Department Provider Note    ED Clinical Impression     Final diagnoses:   Hypoxia (Primary)       HPI, ED Course, Assessment and Plan     Initial Clinical Impression:    November 01, 2023 1:44 PM   Barbara Huber is a 82 y.o. 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 She was reportedly at a doctor's visit this morning when she started feeling short of breath, dizzy, and had blurred vision. Per EMS, pt was initially 68% on RA. On presentation to ED, pt was satting 98% on 10L [place]. Normally wears 3L [place] at home. On my initial assessment, pt was resting comfortably on 3L [place]. She was not tachypneic or SOB, able to answer in complete sentences. Patient denies blurred vision or dizziness at this time.  She denies cough, congestion, recent fevers, chest pain, hemoptysis, n/v/d, abdominal pain, LE swelling/tenderness. She takes 60 mg torsemide daily, which she states that she has been compliant with. She takes daily weights, but unable to specify details about recent weights - states that her daughter keeps up with that information. She is unsure about whether she takes Eliquis; currently prescribed Eliquis 2.5 mg BID.     BP 92/77  - Pulse 119  - Temp 36.7 ??C (98.1 ??F) (Oral)  - Resp 20  - SpO2 93%     Medical Decision Making    Patient presents with acute hypoxia.  Differential diagnosis is broad.  Will obtain troponin and ECG to evaluate for cardiac ischemia or other cardiac causes, proBNP to evaluate for exacerbated congestive heart failure, CBC to evaluate for anemia, electrolyte panel to evaluate for renal dysfunction or other electrolyte abnormalities, and chest x-ray to evaluate for pneumonia, pneumothorax.  If CXR is equivocal, consider V/Q scan to evaluate for PE. Pt has [name] stage IV so will exercise caution with contrast.    Wells??? Criteria for Pulmonary Embolism    no = 0 pts Clinical signs and symptoms of DVT  no = 0 pts PE is leading diagnosis, or equally likely  yes = 1.5 pts Heart rate > 100  yes = 1.5 pts Immobilization at least 3 days, or surgery in the previous 4 weeks  no = 0 pts Previous, objectively diagnosed PE or DVT  no = 0 pts Hemoptysis  no = 0 pts Malignancy w/ treatment within 6 mo, or palliative    Calculated Well's score for PE:      0 - 1.5 points: Low risk group: 1.3% chance of PE in an ED population. Another study assigned scores <= 4 as 'PE Unlikely' and had a 3% incidence of PE.  2 - 4 points: Moderate risk group: 16.2% chance of PE in an ED population. Another study assigned scores <= 4 as 'PE Unlikely' and had a 3% incidence of PE.  4.5 - 6 points: Moderate risk group: 16.2% chance of PE in an ED population. Another study assigned scores > 4 as 'PE Likely' and had a 28% incidence of PE.  > 6 points: High risk group: 40.6% chance of PE in an ED population. Another study assigned scores > 4 as 'PE Likely' and had a 28% incidence of PE.    If there is  low clinical suspicion for pulmonary embolism (gestalt pre-test probability < 15%) AND the Well's score is <= 1.5, there is no need for further workup (including no need to obtain a d-dimer) as the post-test probability of pulmonary embolism is <2%.     If the Well's score is >= 2, this decision rule cannot be used to rule out pulmonary embolism in this patient (consider applying PERC rule, obtaining a d-dimer or appropriate imaging depending on clinical scenario and pre-test probability).    Based on my evaluation of the patient, including application of this decision instrument, further testing to evaluate for pulmonary embolism is indicated at this time.     Further ED updates and updates to plan as per ED Course below:    ED Course:  ED Course as of 11/01/23 1454   Mon Nov 01, 2023   1410 Pt resting comfortably on 3L [place] at this time. Not tachypneic or SOB, able to answer in complete sentences. Tachycardic to 120. Slightly hypotensive to 92/77, afebrile.   1411 Initial ECG with A fib with RVR, rate 111, age indeterminate infarcts in inferior & anterolateral leads.   1424 Blood Gas, Venous(!):    Specimen Source Venous   FIO2 Venous Not Specified   pH, Venous 7.37   pCO2, Ven 57   pO2, Ven 35   HCO3, Ven 33(!)   Base Excess, Ven 5.6(!)   O2 Saturation, Venous 64.1   1424 Lactate, Venous, Whole Blood(!):    Lactate, Venous 1.9(!)   1447 CXR with cardiomegaly, bilateral pulmonary edema on independent interpretation.    1452 Patient to be signed out to incoming provider at 1500. Pending labs. Consider admission for possible CHF exacerbation.         External Records Reviewed: I have reviewed recent and relevant previous record, including: Outpatient notes - PCP notes    Independent Interpretation of Studies: I have independently interpreted the following studies:  Slightly elevated venous lactate, normal pH  Initial ECG with A fib with RVR, rate 111, age indeterminate infarcts in inferior & anterolateral leads.  CXR with cardiomegaly, bilateral pulmonary edema on independent interpretation.       Social Determinants of Health with Concerns     Internet Connectivity: Not on file   Tobacco Use: Medium Risk (11/01/2023)    Patient History     Smoking Tobacco Use: Former     Smokeless Tobacco Use: Never     Passive Exposure: Not on file   Interpersonal Safety: Unknown (11/01/2023)    Interpersonal Safety     Unsafe Where You Currently Live: Not on file     Physically Hurt by Anyone: Not on file     Abused by Anyone: Not on file   Physical Activity: Unknown (11/04/2017)    Received from [facility], [facility]    Exercise Vital Sign     Days of Exercise per Week: Patient declined     Minutes of Exercise per Session: Patient declined   Stress: Unknown (11/04/2017)    Received from [facility], [facility]    [employer] of Occupational Health - Occupational Stress Questionnaire     Feeling of Stress : Patient declined   Social Connections: Unknown (11/04/2017)    Received from [facility], [facility]    Social Connection and Isolation Panel [NHANES]     Frequency of Communication with Friends and Family: Patient declined  Frequency of Social Gatherings with Friends and Family: Patient declined     Attends Religious Services: Patient declined     Database administrator or Organizations: Patient declined     Attends Banker Meetings: Patient declined     Marital Status: Patient declined   Health Literacy: Medium Risk (05/02/2021)    Health Literacy     : Rarely     _____________________________________________________________________    The case was discussed with the attending physician who is in agreement with the above assessment and plan    Past History     PAST MEDICAL HISTORY/PAST SURGICAL HISTORY:   Past Medical History:   Diagnosis Date    Acute kidney injury superimposed on chronic kidney disease (CMS-HCC) 10/11/2020    Acute on chronic diastolic (congestive) heart failure (CMS-HCC) 08/23/2020    Acute on chronic diastolic congestive heart failure (CMS-HCC)     AKI (acute kidney injury) (CMS-HCC) 04/14/2015    Lab Results  Component  Value  Date     CREATININE  1.90 (H)  06/12/2021     Had a bump in her creatinine when she was taking her diuretics every day.  She is currently taking 40 mg daily of torsemide and 50 mg of spironolactone.  Her volume status is fragile.  Previously when she stopped her diuretic she becomes short of breath.  Plan: We will check her BMP today.  We will likely have to go to 40    Anemia 08/22/2019    Iron deficiency anemia          Lab Results      Component    Value    Date           WBC    9.2 03/21/2021           RBC    3.67 (L)    03/21/2021           HGB    9.9 (L)    03/21/2021           HCT    30.7 (L)    03/21/2021           MCV    83.7    03/21/2021           MCH    26.9    03/21/2021           MCHC    32.1    03/21/2021           RDW    21.9 (H)    03/21/2021           PLT    318     Arthritis     Calculus of kidney     Calculus of ureter     CHF (congestive heart failure) (CMS-HCC)     Chronic atrial fibrillation (CMS-HCC) 07/20/2019    COPD (chronic obstructive pulmonary disease) (CMS-HCC)     Diabetes (CMS-HCC)     Gangrenous cholecystitis 10/11/2020    Generalized edema  06/17/2021    GERD (gastroesophageal reflux disease)     Hydronephrosis     Hypertension     Hyponatremia 10/11/2020    Intermediate coronary syndrome (CMS-HCC) 03/13/2014    Lower extremity edema 09/28/2020    Lumbar stenosis     Microscopic hematuria     Nausea alone     Nephrolithiasis 04/17/2016    Neuropathy     Nocturia     Nocturia 07/01/2017  Other chronic cystitis     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  See plans for anemia, AKI  We are also tapering her gabapentin.  Currently on 300 mg 3 times daily.  We will decrease it to twice daily, and then nightly.  She states she is not having any recurrence of her pain.    Pulmonary hypertension (CMS-HCC)     Renal colic     Shortness of breath 04/28/2020    Sleep apnea     Unstable angina pectoris (CMS-HCC) 03/13/2014       Past Surgical History:   Procedure Laterality Date    BACK SURGERY  1995    CARPAL TUNNEL RELEASE Left 2014    HYSTERECTOMY  1971    IR INSERT CHOLECYSTOSMY TUBE PERCUTANEOUS  10/02/2020    IR INSERT CHOLECYSTOSMY TUBE PERCUTANEOUS 10/02/2020 Braulio Conte, MD IMG VIR H&V [facility]    LUMBAR DISC SURGERY      PR REMOVAL GALLBLADDER N/A 10/06/2020    Procedure: CHOLECYSTECTOMY;  Surgeon: Katherina Mires, MD;  Location: MAIN OR [facility];  Service: Trauma    PR RIGHT HEART CATH O2 SATURATION & CARDIAC OUTPUT N/A 09/30/2020    Procedure: Right Heart Catheterization;  Surgeon: Neal Dy, MD;  Location: [facility] CATH;  Service: Cardiology    PR RIGHT HEART CATH O2 SATURATION & CARDIAC OUTPUT N/A 01/09/2021    Procedure: Right Heart Catheterization;  Surgeon: Lesle Reek, MD;  Location: [facility] CATH;  Service: Cardiology       MEDICATIONS:   No current facility-administered medications for this encounter.    Current Outpatient Medications:     acetaminophen (TYLENOL) 500 MG tablet, Take 2 tablets (1,000 mg total) by mouth Three (3) times a day as needed for pain., Disp: , Rfl:     albuterol HFA 90 mcg/actuation inhaler, Inhale 2 puffs every eight (8) hours as needed for wheezing. (Patient not taking: Reported on 10/06/2023), Disp: , Rfl:     amiodarone (PACERONE) 200 MG tablet, Take 1 tablet (200 mg total) by mouth two (2) times a day., Disp: 60 tablet, Rfl: 11    apixaban (ELIQUIS) 2.5 mg Tab, Take 1 tablet (2.5 mg total) by mouth two (2) times a day., Disp: 60 tablet, Rfl: 2    cranberry 500 mg cap, Take 500 mg by mouth daily with evening meal., Disp: , Rfl:     cyclobenzaprine (FLEXERIL) 5 MG tablet, Take 1 tablet (5 mg total) by mouth two (2) times a day as needed for muscle spasms., Disp: 60 tablet, Rfl: 0    diclofenac sodium (VOLTAREN) 1 % gel, Apply 2 g topically four (4) times a day as needed (hand pain and neck pain)., Disp: 350 g, Rfl: 2    ergocalciferol-1,250 mcg, 50,000 unit, (DRISDOL) 1,250 mcg (50,000 unit) capsule, Take 1 capsule (1,250 mcg total) by mouth once a week., Disp: 12 capsule, Rfl: 3    estradioL (ESTRACE) 0.01 % (0.1 mg/gram) vaginal cream, Insert 2 g into the vagina Two (2) times a week., Disp: 42.5 g, Rfl: 3    ferrous sulfate 325 (65 FE) MG EC tablet, Take 1 tablet (325 mg total) by mouth Every Monday, Wednesday, and Friday., Disp: 36 tablet, Rfl: 3    fluticasone propion-salmeterol (ADVAIR HFA) 115-21 mcg/actuation inhaler, Inhale 2 puffs two (2) times a day., Disp: 12 g, Rfl: 11    fosfomycin (MONUROL) 3 gram Pack, Take 3 g by mouth once a week.,  Disp: 12 g, Rfl: 3    gabapentin (NEURONTIN) 100 MG capsule, Take 1 capsule (100 mg total) by mouth two (2) times a day., Disp: 60 capsule, Rfl: 11    lidocaine (ASPERCREME) 4 % patch, Place 1 patch on the skin daily. Remove after 12 hours., Disp: 30 patch, Rfl: 2    metOLazone (ZAROXOLYN) 5 MG tablet, Take 1 tablet (5 mg total) by mouth daily as needed (up to 3x weekly when instructed by cardiology clinic)., Disp: 12 tablet, Rfl: 1    midodrine (PROAMATINE) 5 MG tablet, Take 1 tablet (5 mg total) by mouth three (3) times a day., Disp: 90 tablet, Rfl: 2    montelukast (SINGULAIR) 10 mg tablet, Take 1 tablet (10 mg total) by mouth every morning., Disp: 90 tablet, Rfl: 2    NARCAN 4 mg/actuation nasal spray, 1 spray into alternating nostrils once as needed (opioid overdose). PRN - Emergency use., Disp: , Rfl:     OXYGEN-AIR DELIVERY SYSTEMS MISC, 5 L by Miscellaneous route. Currently using 3  L/min via [place], Disp: , Rfl:     pantoprazole (PROTONIX) 20 MG tablet, TAKE 1 TABLET(20 MG) BY MOUTH DAILY, Disp: 90 tablet, Rfl: 3    rosuvastatin (CRESTOR) 5 MG tablet, TAKE 1 TABLET EVERY OTHER DAY, Disp: 24 tablet, Rfl: 1    tafamidis (VYNDAMAX) 61 mg cap, Take 1 capsule (61 mg) by mouth daily., Disp: 30 capsule, Rfl: 11    tiotropium bromide (SPIRIVA RESPIMAT) 2.5 mcg/actuation inhalation mist, Inhale 2 puffs daily., Disp: 4 g, Rfl: 11    torsemide (DEMADEX) 20 MG tablet, Take 3 tablets (60 mg total) by mouth daily. May also take 1 tablet (20 mg total) daily as needed (weight gain)., Disp: 120 tablet, Rfl: 11    traZODone (DESYREL) 50 MG tablet, Take 1 tablet (50 mg total) by mouth nightly as needed for sleep., Disp: 30 tablet, Rfl: 0    vitamin A-3,000 mcg RAE, 10,000 UNIT, 3,000 mcg RAE (10,000 UNIT) capsule, Take 1 capsule (3,000 mcg of RAE total) by mouth daily., Disp: , Rfl:     vutrisiran (AMVUTTRA) 25 mg/0.5 mL injection, Inject 0.5 mL (25 mg total) under the skin once. Every 12 Weeks, Disp: , Rfl:     ALLERGIES:   Nitrofurantoin and Lipitor [atorvastatin]    SOCIAL HISTORY:   Social History     Tobacco Use    Smoking status: Former     Current packs/day: 0.00     Types: Cigarettes     Quit date: 2019     Years since quitting: 5.8    Smokeless tobacco: Never    Tobacco comments:     Quit a few years ago   Substance Use Topics    Alcohol use: No       FAMILY HISTORY:  Family History   Problem Relation Age of Onset    Hypertension Mother     Cancer Father         COLON CANCER    No Known Problems Sister     No Known Problems Sister     No Known Problems Brother     No Known Problems Brother     No Known Problems Maternal Aunt     No Known Problems Maternal Uncle     No Known Problems Paternal Aunt     No Known Problems Paternal Uncle     No Known Problems Maternal Grandmother     No Known Problems Maternal Grandfather  No Known Problems Paternal Grandmother     No Known Problems Paternal Grandfather     No Known Problems Other     Anesthesia problems Neg Hx     Broken bones Neg Hx     Clotting disorder Neg Hx     Collagen disease Neg Hx     Diabetes Neg Hx     Dislocations Neg Hx     Fibromyalgia Neg Hx     Gout Neg Hx     Hemophilia Neg Hx     Osteoporosis Neg Hx     Rheumatologic disease Neg Hx     Scoliosis Neg Hx     Severe sprains Neg Hx     Sickle cell anemia Neg Hx     Spinal Compression Fracture Neg Hx     GU problems Neg Hx     Kidney cancer Neg Hx     Prostate cancer Neg Hx           Review of Systems     A review of systems was performed and relevant portions were as noted above in HPI     Physical Exam     VITAL SIGNS:    BP 92/77  - Pulse 119  - Temp 36.7 ??C (98.1 ??F) (Oral)  - Resp 20  - SpO2 93%     Constitutional:   Alert and oriented.   Head:   Normocephalic and atraumatic  Eyes:   Conjunctivae are normal, EOMI, PERRL  ENT:   No notable congestion, Mucous membranes moist, External ears normal, no notable stridor  Cardiovascular:   Rate as vitals above. Appears warm and well perfused. Heart rhythm irregularly irregular, tachycardic, normal S1, S2, no murmurs.  Respiratory:   Normal respiratory effort. Breath sounds are normal.  Gastrointestinal:   Soft, non-distended, and nontender.   Genitourinary:   Deferred  Musculoskeletal:    Normal range of motion in all extremities. No tenderness or edema noted in B/L lower extremities  Neurologic:   No gross focal neurologic deficits beyond baseline are appreciated.  Skin:   Skin is warm, dry and intact.       Radiology     XR Chest 2 views    (Results Pending)       Labs     Labs Reviewed   BLOOD GAS, VENOUS - Abnormal; Notable for the following components:       Result Value    HCO3, Ven 33 (*)     Base Excess, Ven 5.6 (*)     All other components within normal limits   LACTATE, VENOUS, WHOLE BLOOD - Abnormal; Notable for the following components:    Lactate, Venous 1.9 (*)     All other components within normal limits   PRO-BNP   COMPREHENSIVE METABOLIC PANEL   CBC W/ DIFFERENTIAL    Narrative:     The following orders were created for panel order CBC w/ Differential.                  Procedure                               Abnormality         Status                                     ---------                               -----------         ------  CBC w/ Differential[[id number]]                             In process                                                   Please view results for these tests on the individual orders.   HIGH SENSITIVITY TROPONIN I - SINGLE   CBC W/ AUTO DIFF         Pertinent labs & imaging results that were available during my care of the patient were reviewed by me and considered in my medical decision making (see chart for details).    Please note- This chart has been created using [employer]. Chart creation errors have been sought, but may not always be located and such creation errors, especially pronoun confusion, do NOT reflect on the standard of medical care.      Cecile Sheerer, MD  Resident  11/01/23 [phone]

## 2023-11-01 NOTE — Unmapped (Signed)
ED Progress Note  Received sign out from previous provider.    Patient Summary: Anastassia Falletta is a 82 y.o. female past medical history of COPD on 3 L O2 at baseline, CHF, A-fib (unclear if taking Eliquis) who presented from pain clinic for hypoxia, saturating 60% on room air.  Unsure if patient was wearing baseline oxygen at the time.  CXR remarkable for bilateral pulmonary edema, troponin 58, proBNP 6696.  Of note, patient takes torsemide at home, unsure if she has taken it today.  Vital signs remarkable for persistently tachycardic.  Will potentially need PE rule out.   Action List:   NTD    Updates  ED Course as of 11/01/23 2246   Mon Nov 01, 2023   1548 Spoke with patient's daughter at bedside he reports that patient was on 3 L [place] when she became hypoxic; however, this was the portable oxygen which they are unsure if it is working properly because in the past patient oxygen has dropped while wearing it.  Patient's daughter does confirm that she does take Eliquis 2.5 mg twice daily, so less likely concern for PE and will not pursue the/Q scan or CTA.  Patient also does take 60 mg torsemide and 5 mg midodrine which she has not taken today.   1914 Given that patient has a portable O2 is unreliable we will contact case management for potential BLS transport    1704 Case management spoke to the patient's DME who will deliver portable oxygen and 1-1.5-hours; therefore, patient will not require BLS transport   1711 hsTroponin I(!!): 105  Given that troponin doubled we will page [facility] for admission   2129 hsTroponin I(!!): 199   2208 Patient was signed out to admitting team     Monico Blitz, MD  Anesthesiology, PGY-1

## 2023-11-02 LAB — CBC W/ AUTO DIFF
BASOPHILS ABSOLUTE COUNT: 0.1 10*9/L (ref 0.0–0.1)
BASOPHILS RELATIVE PERCENT: 0.9 %
EOSINOPHILS ABSOLUTE COUNT: 0.1 10*9/L (ref 0.0–0.5)
EOSINOPHILS RELATIVE PERCENT: 2.1 %
HEMATOCRIT: 32.6 % — ABNORMAL LOW (ref 34.0–44.0)
HEMOGLOBIN: 10.8 g/dL — ABNORMAL LOW (ref 11.3–14.9)
LYMPHOCYTES ABSOLUTE COUNT: 1 10*9/L — ABNORMAL LOW (ref 1.1–3.6)
LYMPHOCYTES RELATIVE PERCENT: 17.3 %
MEAN CORPUSCULAR HEMOGLOBIN CONC: 33.2 g/dL (ref 32.0–36.0)
MEAN CORPUSCULAR HEMOGLOBIN: 30.2 pg (ref 25.9–32.4)
MEAN CORPUSCULAR VOLUME: 91 fL (ref 77.6–95.7)
MEAN PLATELET VOLUME: 9.1 fL (ref 6.8–10.7)
MONOCYTES ABSOLUTE COUNT: 0.8 10*9/L (ref 0.3–0.8)
MONOCYTES RELATIVE PERCENT: 13.6 %
NEUTROPHILS ABSOLUTE COUNT: 3.6 10*9/L (ref 1.8–7.8)
NEUTROPHILS RELATIVE PERCENT: 66.1 %
PLATELET COUNT: 148 10*9/L — ABNORMAL LOW (ref 150–450)
RED BLOOD CELL COUNT: 3.59 10*12/L — ABNORMAL LOW (ref 3.95–5.13)
RED CELL DISTRIBUTION WIDTH: 14.4 % (ref 12.2–15.2)
WBC ADJUSTED: 5.5 10*9/L (ref 3.6–11.2)

## 2023-11-02 LAB — HIGH SENSITIVITY TROPONIN I - SINGLE
HIGH SENSITIVITY TROPONIN I: 218 ng/L (ref <=34)
HIGH SENSITIVITY TROPONIN I: 207 ng/L (ref <=34)

## 2023-11-02 LAB — LACTATE, VENOUS, WHOLE BLOOD: LACTATE BLOOD VENOUS: 1.1 mmol/L (ref 0.5–1.8)

## 2023-11-02 LAB — BASIC METABOLIC PANEL
ANION GAP: 4 mmol/L — ABNORMAL LOW (ref 5–14)
BLOOD UREA NITROGEN: 46 mg/dL — ABNORMAL HIGH (ref 9–23)
BUN / CREAT RATIO: 20
CALCIUM: 9.1 mg/dL (ref 8.7–10.4)
CHLORIDE: 98 mmol/L (ref 98–107)
CO2: 33 mmol/L — ABNORMAL HIGH (ref 20.0–31.0)
CREATININE: 2.27 mg/dL — ABNORMAL HIGH
EGFR CKD-EPI (2021) FEMALE: 21 mL/min/{1.73_m2} — ABNORMAL LOW (ref >=60)
GLUCOSE RANDOM: 85 mg/dL (ref 70–179)
POTASSIUM: 3.3 mmol/L — ABNORMAL LOW (ref 3.4–4.8)
SODIUM: 135 mmol/L (ref 135–145)

## 2023-11-02 LAB — MAGNESIUM: MAGNESIUM: 2.2 mg/dL (ref 1.6–2.6)

## 2023-11-02 LAB — PHOSPHORUS: PHOSPHORUS: 4.8 mg/dL (ref 2.4–5.1)

## 2023-11-02 MED ORDER — SPIRONOLACTONE 25 MG TABLET
ORAL_TABLET | Freq: Every day | ORAL | 2 refills | 60 days | Status: CP
Start: 2023-11-02 — End: ?

## 2023-11-02 MED ADMIN — gabapentin (NEURONTIN) capsule 100 mg: 100 mg | ORAL | @ 06:00:00 | Stop: 2023-11-02

## 2023-11-02 MED ADMIN — pantoprazole (Protonix) EC tablet 20 mg: 20 mg | ORAL | @ 15:00:00 | Stop: 2023-11-02

## 2023-11-02 MED ADMIN — amiodarone (PACERONE) tablet 200 mg: 200 mg | ORAL | @ 15:00:00 | Stop: 2023-11-02

## 2023-11-02 MED ADMIN — apixaban (ELIQUIS) tablet 2.5 mg: 2.5 mg | ORAL | @ 06:00:00 | Stop: 2023-11-02

## 2023-11-02 MED ADMIN — torsemide (DEMADEX) tablet 80 mg: 80 mg | ORAL | @ 15:00:00 | Stop: 2023-11-02

## 2023-11-02 MED ADMIN — midodrine (PROAMATINE) tablet 5 mg: 5 mg | ORAL | @ 15:00:00 | Stop: 2023-11-02

## 2023-11-02 MED ADMIN — oxyCODONE (ROXICODONE) immediate release tablet 5 mg: 5 mg | ORAL | @ 17:00:00 | Stop: 2023-11-02

## 2023-11-02 MED ADMIN — potassium chloride ER tablet 40 mEq: 40 meq | ORAL | @ 15:00:00 | Stop: 2023-11-02

## 2023-11-02 MED ADMIN — apixaban (ELIQUIS) tablet 2.5 mg: 2.5 mg | ORAL | @ 15:00:00 | Stop: 2023-11-02

## 2023-11-02 MED ADMIN — fluticasone furoate-vilanterol (BREO ELLIPTA) 100-25 mcg/dose inhaler 1 puff: 1 | RESPIRATORY_TRACT | @ 15:00:00 | Stop: 2023-11-02

## 2023-11-02 MED ADMIN — amiodarone (PACERONE) tablet 200 mg: 200 mg | ORAL | @ 06:00:00 | Stop: 2023-11-02

## 2023-11-02 MED ADMIN — gabapentin (NEURONTIN) capsule 100 mg: 100 mg | ORAL | @ 15:00:00 | Stop: 2023-11-02

## 2023-11-02 MED ADMIN — midodrine (PROAMATINE) tablet 5 mg: 5 mg | ORAL | @ 02:00:00 | Stop: 2023-11-01

## 2023-11-02 MED ADMIN — montelukast (SINGULAIR) tablet 10 mg: 10 mg | ORAL | @ 15:00:00 | Stop: 2023-11-02

## 2023-11-02 MED ADMIN — umeclidinium (INCRUSE ELLIPTA) 62.5 mcg/actuation inhaler 1 puff: 1 | RESPIRATORY_TRACT | @ 15:00:00 | Stop: 2023-11-02

## 2023-11-02 NOTE — Unmapped (Incomplete)
[facility] Discharge Summary    Identifying Information:   Barbara Huber  1941/12/22  [id number]    Admit date: 11/01/2023    Discharge date: 11/02/2023     Discharge Service: Heart Failure (MDD)    Discharge Attending Physician: Liliane Shi, MD    Discharge to: [facility] DISCHARGE DESTINATION:29393}    Discharge Diagnoses:  Principal Diagnosis:  {Principal Diagnosis:69148}    Secondary Diagnoses:  {Myocardial Injury / Ischemic Heart Disease:69395}  {Heart Failure:69397}  {Valvular Heart Disease:69399}  {Arrhythmia:69400}  {Risk Factors / OZHYQ:65784}  {Respiratory:69155}  {Renal / Electrolytes:69156}  {Liver / ON:62952}  {Nutrition / WUXL:24401}  {Heme / Onc / ID / Immunology:69159}  {Neuro / Substance Use / Psych:69160}  {Mobility / Disposition:69161}      Principal Problem:    Acute exacerbation of CHF (congestive heart failure) (CMS-HCC)  Active Problems:    COPD (chronic obstructive pulmonary disease) (CMS-HCC)    Essential hypertension    Type 2 diabetes mellitus with stage 4 chronic kidney disease, with long-term current use of insulin (CMS-HCC)    Chronic respiratory failure with hypoxia (CMS-HCC)    Persistent atrial fibrillation (CMS-HCC)    Chronic kidney disease ([name]), stage IV (severe) (CMS-HCC)    Chronic prescription opiate use    Familial amyloid heart disease (CMS-HCC)    Hospital Course:   Barbara Huber is a 82 y.o. female with pertinent PMH of [name], cardiac amyloidosis, pulmonary hypertension, chronic respiratory failure on 3 L, COPD, [name] stage IV, atrial fibrillation, DM II who to the ED with acute on chronic hypoxic respiratory failure.        Acute on Chronic Respiratory Failure - Lactic Acidosis  COPD - Pulmonary HTN: on 3L home O2 at baseline. Pt presented with O2 sats in the 60s requiring BiPAP in the ED (her home oxygen tank was malfunctioning; case management in the ED assisted with getting a new tank as there were initial plans for DC from the ED. Will need to readdress this prior to dc. LA 1.9 on admission. VBG otherwise stable.  - Continue home supplemental O2 at 3L  - Breo and Incruse inhalers in place of home inhalers  - continue home Singulair 10mg  daily   - PRN albuterol  - Continuous O2 monitoring  - trend LA     Type II MI  Patient presented with Troponin elevation on admission. Most recently 199 and not yet peaked. ECG without acute ischemic ST changes. This is likely 2/2 demand in the s/o Acute on Chronic Hypoxic Resp Failure.   - trend troponin to a peak      Chronic [name] Exacerbation - TTR Cardiac Amyloidosis: Patient follows with cardiology outpatient in the cardiac amyloid clinic and has been seen in pulm HTN clinic as well. Home diuresis includes Torsemide 80 mg daily and metolazone 5mg  PRN. Jardiance stopped due to UTIs and she had issues with hypotension with ARB and BB. Spironolactone was stopped during previous admission for AKI. Taking midodrine 5mg  TID. She is not interested in CardioMEMS at this time. 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 2nd injection July 2024. October injection postponed in light of recent admission. Also on vitamin A supplementation. She is on Tafamidis 61 mg daily as well. ProBNP 6696 on admission (improved from prior 9769). Given home Torsemide 80mg  in the ED.  - continue home diuresis (Torsemide 80 mg daily)  - holding Metolazone 5mg  PRN  - Strict Is/Os; daily weights  -  Continue home Tafamidis 61 mg daily; holding Amturra inpatient   -Restart spiro 12.5mg , hold at home if SBP < 90     Goals of care  - There was an outpatient discussion last visit w/ pt and daughter Barbara Huber regarding pt's goals of care. They discussed the role of palliative care (has seen Dr Garner Nash) and hospice (when appropriate). She admitted to wishing to return to hospital if needed, but likely not ICU level care or invasive monitoring (such as swan).       Hx of Persistent Atrial Fibrillation: CHA2DS2-VASc score = 5 (1-gender, 2-Age, 1-DM, 1-HTN). Is on chronic amiodarone 200 mg daily for atrial fibrillation. She held due to to bleeding episodes and anemia in the past; now on Eliquis 2.5mg .  - Continue Amiodarone 200 mg daily  - Continue Eliquis 2.5mg  po BID  - monitor H/H closely and dc Eliquis if falling  - continuous telemetry monitoring ordered        Chronic Kidney Disease stage V: Baseline Cr 2.5-3. Creatinine stable on admission. Follows with nephrology in the outpatient.   - Diuresis as above  - avoid hypotension and nephrotoxins  - renally dose all meds         Chronic Problems  Chronic Pain: Followed by the outpatient pain clinic [facility] in [place]. Continue current regimen inpatient with bowel regimen ordered.  Peripheral Neuropathy: continue Gabapentin 100 mg BID  HLD: Holding home Crestor as not available and patient had myopathy due to atorvastatin  GERD: continue home protonix 20 mg daily  DM II: Not currently on treatment. A1C 5.8 on 08/30/23  Gout: Recent flare of left elbow and wrist that was treated with prednisone in August. Continue to monitor.   Insomnia: continue home Trazodone 50mg  po at bedtime PRN       Outpatient Follow Up Issues:   ***Labs/Meds/Pending results    Procedures:  {LM PROCEDURES INPATIENT:31637}  No admission procedures for hospital encounter.  ______________________________________________________________________    Discharge Day Services:  Pt seen on the day of discharge and determined appropriate for discharge.  BP 91/68  - Pulse 101  - Temp 36.6 ??C (97.9 ??F) (Oral)  - Resp 20  - Ht 165.1 cm (5' 5)  - Wt 91.5 kg (201 lb 11.5 oz)  - SpO2 94%  - BMI 33.57 kg/m??     Admission wt = Weight: 91.5 kg (201 lb 11.5 oz)  Last wt = Weight: 91.5 kg (201 lb 11.5 oz)  Last 30 Recorded Weights    11/02/23 0107   Weight: 91.5 kg (201 lb 11.5 oz)       Exam stable with {Blank single:19197::clear lungs, no JVD, RRR, nondistended/nontender abd with +BS, no pedal edema, nonfocal neuro exam., clear lungs, no JVD, LVAD hum, nondistended/nontender abd with +BS, no pedal edema, nonfocal neuro exam.,JVP ***, clear lungs, nondistended/nontender abd with +BS and liver edge ***, *** pedal edema, nonfocal neuro exam.}    Last LVAD parameters prior to discharge:  Speed *** rpm: Flow *** lpm, PI ***, Power *** Watts      Condition at Discharge: {condition:18240}  ______________________________________________________________________  Discharge Medications:     Your Medication List        START taking these medications      spironolactone 25 MG tablet  Commonly known as: ALDACTONE  Take 0.5 tablets (12.5 mg total) by mouth daily.            CONTINUE taking these medications      acetaminophen 500  MG tablet  Commonly known as: TYLENOL  Take 2 tablets (1,000 mg total) by mouth Three (3) times a day as needed for pain.     albuterol 90 mcg/actuation inhaler  Commonly known as: PROVENTIL HFA;VENTOLIN HFA  Inhale 2 puffs every eight (8) hours as needed for wheezing.     amiodarone 200 MG tablet  Commonly known as: PACERONE  Take 1 tablet (200 mg total) by mouth two (2) times a day.     AMVUTTRA 25 mg/0.5 mL injection  Generic drug: vutrisiran  Inject 0.5 mL (25 mg total) under the skin once. Every 12 Weeks     cranberry 500 mg Cap  Take 500 mg by mouth daily with evening meal.     cyclobenzaprine 5 MG tablet  Commonly known as: FLEXERIL  Take 1 tablet (5 mg total) by mouth two (2) times a day as needed for muscle spasms.     diclofenac sodium 1 % gel  Commonly known as: VOLTAREN  Apply 2 g topically four (4) times a day as needed (hand pain and neck pain).     ELIQUIS 2.5 mg Tab  Generic drug: apixaban  Take 1 tablet (2.5 mg total) by mouth two (2) times a day.     ergocalciferol-1,250 mcg (50,000 unit) 1,250 mcg (50,000 unit) capsule  Commonly known as: DRISDOL  Take 1 capsule (1,250 mcg total) by mouth once a week.     estradiol 0.01 % (0.1 mg/gram) vaginal cream  Commonly known as: ESTRACE  Insert 2 g into the vagina Two (2) times a week.     ferrous sulfate 325 (65 FE) MG EC tablet  Take 1 tablet (325 mg total) by mouth Every Monday, Wednesday, and Friday.     fluticasone propion-salmeterol 115-21 mcg/actuation inhaler  Commonly known as: ADVAIR HFA  Inhale 2 puffs two (2) times a day.     fosfomycin 3 gram Pack  Commonly known as: MONUROL  Take 3 g by mouth once a week.     gabapentin 100 MG capsule  Commonly known as: NEURONTIN  Take 1 capsule (100 mg total) by mouth two (2) times a day.     LIDOCAINE PAIN RELIEF 4 % patch  Generic drug: lidocaine  Place 1 patch on the skin daily. Remove after 12 hours.     metOLazone 5 MG tablet  Commonly known as: ZAROXOLYN  Take 1 tablet (5 mg total) by mouth daily as needed (up to 3x weekly when instructed by cardiology clinic).     midodrine 5 MG tablet  Commonly known as: PROAMATINE  Take 1 tablet (5 mg total) by mouth three (3) times a day.     montelukast 10 mg tablet  Commonly known as: SINGULAIR  Take 1 tablet (10 mg total) by mouth every morning.     NARCAN 4 mg/actuation nasal spray  Generic drug: naloxone  1 spray into alternating nostrils once as needed (opioid overdose). PRN - Emergency use.     OXYGEN-AIR DELIVERY SYSTEMS MISC  5 L by Miscellaneous route. Currently using 3  L/min via Delta     pantoprazole 20 MG tablet  Commonly known as: Protonix  TAKE 1 TABLET(20 MG) BY MOUTH DAILY     rosuvastatin 5 MG tablet  Commonly known as: CRESTOR  TAKE 1 TABLET EVERY OTHER DAY     SPIRIVA RESPIMAT 2.5 mcg/actuation inhalation mist  Generic drug: tiotropium bromide  Inhale 2 puffs daily.     torsemide 20 MG tablet  Commonly known as: DEMADEX  Take 3 tablets (60 mg total) by mouth daily. May also take 1 tablet (20 mg total) daily as needed (weight gain).     traZODone 50 MG tablet  Commonly known as: DESYREL  Take 1 tablet (50 mg total) by mouth nightly as needed for sleep.     vitamin A-3,000 mcg RAE (10,000 UNIT) 3,000 mcg RAE (10,000 UNIT) capsule  Take 1 capsule (3,000 mcg of RAE total) by mouth daily.     VYNDAMAX 61 mg Cap  Generic drug: tafamidis  Take 1 capsule (61 mg) by mouth daily.            ASK your doctor about these medications      LORazepam 0.5 MG tablet  Commonly known as: ATIVAN  Take 1 tablet (0.5 mg total) by mouth two (2) times a day for 5 days.  Ask about: Should I take this medication?     oxyCODONE 5 MG immediate release tablet  Commonly known as: ROXICODONE  Take 1 tablet (5 mg total) by mouth every eight (8) hours as needed for pain (moderate to severe pain).  Ask about: Should I take this medication?     potassium chloride 20 MEQ ER tablet  Take 1 tablet (20 mEq total) by mouth daily for 7 days.  Ask about: Should I take this medication?              ______________________________________________________________________  Pending Test Results (if blank, then none):      Most Recent Labs:  Recent Labs     Units 11/01/23  1408 11/02/23  0623   NA mmol/L 134* 135   K mmol/L 3.4 3.3*   CL mmol/L 99 98   CO2 mmol/L 30.0 33.0*   BUN mg/dL 40* 46*   CREATININE mg/dL 1.61* 0.96*   CALCIUM mg/dL 8.8 9.1   MG mg/dL  --  2.2   PHOS mg/dL  --  4.8     Recent Labs     Units 11/01/23  1408 11/02/23  0623   WBC 10*9/L 7.2 5.5   HGB g/dL 04.5 40.9*   HCT % 81.1 32.6*   PLT 10*9/L 154 148*     Lab Results   Component Value Date    ALKPHOS 164 (H) 11/01/2023    BILITOT 0.7 11/01/2023    BILIDIR 0.70 (H) 09/29/2023    PROT 6.6 11/01/2023    ALBUMIN 3.2 (L) 11/01/2023    ALT 8 (L) 11/01/2023    AST 24 11/01/2023    GGT 215 (H) 09/29/2020     Lab Results   Component Value Date    LDH 632 (H) 06/03/2021    INR 1.04 09/24/2023    INR 1.20 08/30/2023    INR 1.0 01/06/2011    PRO-BNP 6,696.0 (H) 11/01/2023    PRO-BNP 9,769.0 (H) 10/27/2023    BNP 450.68 (H) 08/30/2023    BNP 324.32 (H) 08/27/2023     Microbiology Results (last day)       Procedure Component Value Date/Time Date/Time    Influenza/ RSV/COVID PCR [[id number]]  (Normal) Collected: 11/01/23 2040    Lab Status: Final result Specimen: Nasopharyngeal Swab Updated: 11/01/23 2135     SARS-CoV-2 PCR Negative     Influenza A Negative     Influenza B Negative     RSV Negative    Narrative:      This test was performed using the Cepheid Xpert Xpress CoV-2/Flu/RSV plus assay, which has been  validated by the CLIA-certified, CAP-inspected [employer] FDA has granted Emergency Use Authorization for this test. Negative results do not preclude infection and should be interpreted along with clinical observations, patient history, and epidemiological information. Information for providers and patients can be found here: [email or web address]            Hospital Radiology:  ECG 12 Lead    Result Date: 11/01/2023  ATRIAL FIBRILLATION WITH RAPID VENTRICULAR RESPONSE INTRAVENTRICULAR CONDUCTION DELAY INFERIOR INFARCT , AGE UNDETERMINED ANTEROLATERAL INFARCT  (CITED ON OR BEFORE 06-Oct-2023) ABNORMAL ECG WHEN COMPARED WITH ECG OF 27-Oct-2023 14:34, NO SIGNIFICANT CHANGE WAS FOUND Confirmed by Pollyann Kennedy ([id number]) on 11/01/2023 9:35:02 PM    XR Chest 2 views    Result Date: 11/01/2023  EXAM: XR CHEST 2 VIEWS ACCESSION: [id number] UN CLINICAL INDICATION: HYPOXEMIA  TECHNIQUE: PA and Lateral Chest Radiographs. COMPARISON: 09/28/2023 chest x-ray. FINDINGS: Right IJ pulmonary artery catheter has been removed. Bilateral vascular indistinctness and hazy interstitial opacities are present. Left basilar atelectasis. Small bilateral pleural effusions. No pneumothorax. Cardiac silhouette is normal in size for technique. Aorta is calcified and tortuous.     Small bilateral pleural effusions with mild pulmonary edema. Right IJ pulmonary artery catheter has been removed.     ______________________________________________________________________    Discharge Plan and Instructions:   Follow Up instructions and Outpatient Referrals     Call MD for:  difficulty breathing, headache or visual disturbances      Call MD for:  persistent dizziness or light-headedness      Call MD for:  temperature >38.5 Celsius      Discharge instructions          Appointments:  Appointments which have been scheduled for you      Nov 11, 2023 2:00 PM  (Arrive by 1:45 PM)  DIURESIS HF [place] with DIURESIS HF NP [facility]  [facility]ARDIOLOGY [place] [facility] REG[place]) [address]  [facility] 1 through 4  [place] [address]  [phone]        Nov 11, 2023 3:30 PM  (Arrive by 3:15 PM)  RETURN NEPHROLOGY with Abhijit Lonia Blood, MD  [facility] [place] [facility] REGION) [address]  [facility] 1 through 4  [place] [address]  [phone]        Nov 18, 2023 3:20 PM  (Arrive by 3:05 PM)  RETURN VIDEO MYCHART with Artelia Laroche, MD  [facility] INTERNAL MEDICINE [place]  [facility] REGION) [address]  [facility] 1 through 4  [place] [address]  [phone]   Please sign into My [place] Chart at least 15 minutes before your appointment to complete the eCheck-In process. You must complete eCheck-In before you can start your video visit. We also recommend testing your audio and video connection to troubleshoot any issues before your visit begins. Click ???Join Video Visit??? to complete these checks. Once you have completed eCheck-In and tested your audio and video, click ???Join Call??? to connect to your visit.     For your video visit, you will need a computer with a working camera, speaker and microphone, a smartphone, or a tablet with internet access.    My [place] Chart enables you to manage your health, send non-urgent messages to your provider, view your test results, schedule and manage appointments, and request prescription refills securely and conveniently from your computer or mobile device.    You can go to [email or web address]  to sign in to your My [place] Chart account with your username and password. If you have forgotten your username or password, please choose the ???Forgot Username???? and/or ???Forgot Password???? links to gain access. You also can access your My [place] Chart account with the free MyChart mobile app for Android or iPhone.    If you need assistance accessing your My [place] Chart account or for assistance in reaching your provider's office to reschedule or cancel your appointment, please call [facility] [phone].                Length of Discharge: I spent {discharge 28mins:23311} than 30 mins in the discharge of this patient.      Daisey Must, PA

## 2023-11-02 NOTE — Unmapped (Signed)
[facility] Advanced Heart Failure/Transplant/LVAD Cardiology (MDD) History & Physical    Assessment & Plan:   Barbara Huber is a 82 y.o. female with pertinent PMH of [name], cardiac amyloidosis, pulmonary hypertension, chronic respiratory failure on 3 L, COPD, [name] stage IV, atrial fibrillation, DM II who to the ED with acute on chronic hypoxic respiratory failure.     Principal Problem:    Acute exacerbation of CHF (congestive heart failure) (CMS-HCC)  Active Problems:    COPD (chronic obstructive pulmonary disease) (CMS-HCC)    Essential hypertension    Type 2 diabetes mellitus with stage 4 chronic kidney disease, with long-term current use of insulin (CMS-HCC)    Chronic respiratory failure with hypoxia (CMS-HCC)    Persistent atrial fibrillation (CMS-HCC)    Chronic kidney disease ([name]), stage IV (severe) (CMS-HCC)    Chronic prescription opiate use    Familial amyloid heart disease (CMS-HCC)    Acute on Chronic Respiratory Failure - Lactic Acidosis  COPD - Pulmonary HTN: on 3L home O2 at baseline. Pt presented with O2 sats in the 60s requiring BiPAP in the ED (her home oxygen tank was malfunctioning; case management in the ED assisted with getting a new tank as there were initial plans for DC from the ED. Will need to readdress this prior to dc. LA 1.9 on admission. VBG otherwise stable.  - Continue home supplemental O2 at 3L  - Breo and Incruse inhalers in place of home inhalers  - continue home Singulair 10mg  daily   - PRN albuterol  - Continuous O2 monitoring  - trend LA     Type II MI  Patient presented with Troponin elevation on admission. Most recently 199 and not yet peaked. ECG without acute ischemic ST changes. This is likely 2/2 demand in the s/o Acute on Chronic Hypoxic Resp Failure.   - trend troponin to a peak     Chronic [name] Exacerbation - TTR Cardiac Amyloidosis: Patient follows with cardiology outpatient in the cardiac amyloid clinic and has been seen in pulm HTN clinic as well. Home diuresis includes Torsemide 80 mg daily and metolazone 5mg  PRN. Jardiance stopped due to UTIs and she had issues with hypotension with ARB and BB. Spironolactone was stopped during previous admission for AKI. Taking midodrine 5mg  TID. She is not interested in CardioMEMS at this time. For her hATTR amyloid with neuropathy leading to falls, she was started Amvuttra 25mg  q35mos in April 2024. 2nd injection July 2024. October injection postponed in light of recent admission. Also on vitamin A supplementation. She is on Tafamidis 61 mg daily as well. ProBNP 6696 on admission (improved from prior 9769). Given home Torsemide 80mg  in the ED.  - continue home diuresis (Torsemide 80 mg daily)  - holding Metolazone 5mg  PRN  - Strict Is/Os; daily weights  - Continue home Tafamidis 61 mg daily; holding Amturra inpatient      Goals of care  - There was an outpatient discussion last visit w/ pt and daughter [name] regarding pt's goals of care. They discussed the role of palliative care (has seen Dr Garner Nash) and hospice (when appropriate). She admitted to wishing to return to hospital if needed, but likely not ICU level care or invasive monitoring (such as swan).      Hx of Persistent Atrial Fibrillation: CHA2DS2-VASc score = 5 (1-gender, 2-Age, 1-DM, 1-HTN). Is on chronic amiodarone 200 mg daily for atrial fibrillation. She held due to to bleeding episodes and anemia in the past; now on Eliquis  2.5mg .  - Continue Amiodarone 200 mg daily  - Continue Eliquis 2.5mg  po BID  - monitor H/H closely and dc Eliquis if falling  - continuous telemetry monitoring ordered      Chronic Kidney Disease stage V: Baseline Cr 2.5-3. Creatinine stable on admission. Follows with nephrology in the outpatient.   - Diuresis as above  - avoid hypotension and nephrotoxins  - renally dose all meds         Chronic Problems  Chronic Pain: Followed by the outpatient pain clinic [facility] in [place]. Continue current regimen inpatient with bowel regimen ordered.  Peripheral Neuropathy: continue Gabapentin 100 mg BID  HLD: Holding home Crestor as not available and patient had myopathy due to atorvastatin  GERD: continue home protonix 20 mg daily  DM II: Not currently on treatment. A1C 5.8 on 08/30/23  Gout: Recent flare of left elbow and wrist that was treated with prednisone in August. Continue to monitor.   Insomnia: continue home Trazodone 50mg  po at bedtime PRN      Checklist:  Diet: Heart Healthy (3g Na) and Regular Diet  DVT PPx: Patient Already on Full Anticoagulation with Eliquis  Code Status: Full Code  Dispo: Patient appropriate for Observation based on expectation at time of admission that period of observation will last less than two midnights    Team Contact Information:   Primary Team: Advanced Heart Failure (MEDD)  Primary Resident: Lorenda Hatchet, MD    Chief Concern:   Acute on Chronic Hypoxic respiratory failure    Subjective:     Barbara Huber is a 82 y.o. female with pertinent PMH of [name], cardiac amyloidosis, pulmonary hypertension, chronic respiratory failure on 3 L, COPD, [name] stage IV, atrial fibrillation, DM II who to the ED with acute on chronic hypoxic respiratory failure. She presented from the [facility] clinic for hypoxia with c/o dizziness, saturating 60% on room air in the setting of a malfunctioning home O2 tank. CBC and CMP stable at baseline. proBNP [id number], improved from prior. LA 1.9. RVP negative. hsTroponin 58 -> 105 -> 199. ECG without ischemic ST changes. CXR with stable pulmonary edema. Pt given home Torsemide 80mg  x 1 in the ED. O2 quickly weaned back to home 3L [place]. There were initial plans to dc from the ED and thus case management assisted with getting patient a new home O2 tank. In the setting of troponin continuing to rise, the decision was made to admit to observation for continued evaluation.     Upon evaluation today, patient and her daughter note that they were seen in [name] clinic by Derwood Kaplan NP on 10/30. At that time, they were told that the pt's goal weight was around 195lb and she needed to take a Metolazone if weight > 195lb. She last took an extra metolazone on Saturday 11/2 and has had a stable weight since that time.     Pt notes that she did not know that her O2 tank was malfunctioning but does admit to lightheadedness and dizziness when arriving at her outpatient appointment today. She feels much better now and is back on home 3L O2 [place]. She denies any fever, chills, sweats, infectious symptoms or sick contacts. She denies any chest pain or palpitations. There are no other concerns at this time.       Pertinent Surgical Hx  Past Surgical History:   Procedure Laterality Date    BACK SURGERY  1995    CARPAL TUNNEL RELEASE Left 2014  HYSTERECTOMY  1971    IR INSERT CHOLECYSTOSMY TUBE PERCUTANEOUS  10/02/2020    IR INSERT CHOLECYSTOSMY TUBE PERCUTANEOUS 10/02/2020 Braulio Conte, MD IMG VIR [facility]    LUMBAR DISC SURGERY      PR REMOVAL GALLBLADDER N/A 10/06/2020    Procedure: CHOLECYSTECTOMY;  Surgeon: Katherina Mires, MD;  Location: MAIN OR [facility];  Service: Trauma    PR RIGHT HEART CATH O2 SATURATION & CARDIAC OUTPUT N/A 09/30/2020    Procedure: Right Heart Catheterization;  Surgeon: Neal Dy, MD;  Location: [facility] CATH;  Service: Cardiology    PR RIGHT HEART CATH O2 SATURATION & CARDIAC OUTPUT N/A 01/09/2021    Procedure: Right Heart Catheterization;  Surgeon: Lesle Reek, MD;  Location: [facility] CATH;  Service: Cardiology         Pertinent Family Hx  Family History   Problem Relation Age of Onset    Hypertension Mother     Cancer Father         COLON CANCER    No Known Problems Sister     No Known Problems Sister     No Known Problems Brother     No Known Problems Brother     No Known Problems Maternal Aunt     No Known Problems Maternal Uncle     No Known Problems Paternal Aunt     No Known Problems Paternal Uncle     No Known Problems Maternal Grandmother     No Known Problems Maternal Grandfather     No Known Problems Paternal Grandmother     No Known Problems Paternal Grandfather     No Known Problems Other     Anesthesia problems Neg Hx     Broken bones Neg Hx     Clotting disorder Neg Hx     Collagen disease Neg Hx     Diabetes Neg Hx     Dislocations Neg Hx     Fibromyalgia Neg Hx     Gout Neg Hx     Hemophilia Neg Hx     Osteoporosis Neg Hx     Rheumatologic disease Neg Hx     Scoliosis Neg Hx     Severe sprains Neg Hx     Sickle cell anemia Neg Hx     Spinal Compression Fracture Neg Hx     GU problems Neg Hx     Kidney cancer Neg Hx     Prostate cancer Neg Hx      Pertinent Social Hx   Social History     Social History Narrative    Previously worked in Designer, fashion/clothing, retired.  Daughter is Economist who works at [employer].  Her niece is Selena Batten, [facility] [employer] [facility] [employer] Anesthesia Tech.  Lives in [place] with daughter and son-in-law.     Allergies  Nitrofurantoin and Lipitor [atorvastatin]    I reviewed the Medication List. The current list is Accurate    Designated Healthcare Decision Maker:  Ms. Kolacz currently has decisional capacity for healthcare decision-making and is able to designate a surrogate healthcare decision maker. Ms. Vantiem designated healthcare decision maker(s) is/are her daughter, Mardi Mainland.    Objective:   Physical Exam:  Temp:  [36.7 ??C (98.1 ??F)] 36.7 ??C (98.1 ??F)  Heart Rate:  [98-120] 100  SpO2 Pulse:  [94-115] 100  Resp:  [13-20] 18  BP: (82-130)/(46-78) 111/53  SpO2:  [92 %-98 %] 97 %    Gen: NAD, converses   Eyes: Sclera anicteric, EOMI grossly normal  HENT: Atraumatic, normocephalic  Neck: Trachea midline  Heart: RRR  Lungs: CTAB, no crackles or wheezes  Abdomen: Soft, NTND  Extremities: No edema  Neuro: Grossly symmetric, non-focal    Skin:  No rashes, lesions on clothed exam  Psych: Alert, oriented    Wt Readings from Last 6 Encounters:   11/02/23 91.5 kg (201 lb 11.5 oz)   10/27/23 92.5 kg (204 lb)   10/15/23 88.9 kg (196 lb)   10/06/23 90.1 kg (198 lb 9.6 oz)   09/30/23 95 kg (209 lb 7 oz)   09/24/23 94.3 kg (208 lb)

## 2023-11-02 NOTE — Unmapped (Signed)
Received sign out from previous provider.    Patient Summary: Barbara Huber is a 82 y.o. 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 Now saturating at 98% on 4L. Being admitted for elevated troponin's.  Action List:   Patient admitted to hospital.    Updates  ED Course as of 11/02/23 0350   Tue Nov 02, 2023   0349 Admitted to Med D.   [phone] hsTroponin I (single, no delta)(!!):    hsTroponin I 218(!!)

## 2023-11-02 NOTE — Unmapped (Signed)
Pt remained free from falls and injury this shift. A&Ox4. VSS, on 3L via [place] which is pts baseline O2. No c/o chest pain or shortness of breath. Trending trops currently. Pt reports ambulating with a walker at home. Tele and continuous O2 monitoring in place. Purewick for I&O. Bed in low locked position, call bell in reach.   Problem: Adult Inpatient Plan of Care  Goal: Plan of Care Review  Outcome: Progressing  Goal: Patient-Specific Goal (Individualized)  Outcome: Progressing  Goal: Absence of Hospital-Acquired Illness or Injury  Outcome: Progressing  Intervention: Identify and Manage Fall Risk  Recent Flowsheet Documentation  Taken 11/02/2023 0056 by Candie Chroman, RN  Safety Interventions:   low bed   lighting adjusted for tasks/safety   fall reduction program maintained   family at bedside  Intervention: Prevent Skin Injury  Recent Flowsheet Documentation  Taken 11/02/2023 0056 by Candie Chroman, RN  Positioning for Skin: Supine/Back  Device Skin Pressure Protection: absorbent pad utilized/changed  Skin Protection: adhesive use limited  Intervention: Prevent and Manage VTE (Venous Thromboembolism) Risk  Recent Flowsheet Documentation  Taken 11/02/2023 0112 by Candie Chroman, RN  VTE Prevention/Management: anticoagulant therapy  Goal: Optimal Comfort and Wellbeing  Outcome: Progressing  Goal: Readiness for Transition of Care  Outcome: Progressing  Goal: Rounds/Family Conference  Outcome: Progressing     Problem: Fall Injury Risk  Goal: Absence of Fall and Fall-Related Injury  Outcome: Progressing  Intervention: Promote Injury-Free Environment  Recent Flowsheet Documentation  Taken 11/02/2023 0056 by Candie Chroman, RN  Safety Interventions:   low bed   lighting adjusted for tasks/safety   fall reduction program maintained   family at bedside     Problem: Self-Care Deficit  Goal: Improved Ability to Complete Activities of Daily Living  Outcome: Progressing     Problem: Heart Failure  Goal: Optimal Coping  Outcome: Progressing  Goal: Optimal Cardiac Output  Outcome: Progressing  Goal: Stable Heart Rate and Rhythm  Outcome: Progressing  Goal: Optimal Functional Ability  Outcome: Progressing  Goal: Fluid and Electrolyte Balance  Outcome: Progressing  Goal: Improved Oral Intake  Outcome: Progressing  Goal: Effective Oxygenation and Ventilation  Outcome: Progressing  Goal: Effective Breathing Pattern During Sleep  Outcome: Progressing     Problem: Anemia  Goal: Anemia Symptom Improvement  Outcome: Progressing  Intervention: Monitor and Manage Anemia  Recent Flowsheet Documentation  Taken 11/02/2023 0056 by Candie Chroman, RN  Safety Interventions:   low bed   lighting adjusted for tasks/safety   fall reduction program maintained   family at bedside     Problem: Gas Exchange Impaired  Goal: Optimal Gas Exchange  Outcome: Progressing     Problem: Comorbidity Management  Goal: Maintenance of COPD Symptom Control  Outcome: Progressing  Goal: Blood Pressure in Desired Range  Outcome: Progressing

## 2023-11-03 DIAGNOSIS — Z09 Encounter for follow-up examination after completed treatment for conditions other than malignant neoplasm: Principal | ICD-10-CM

## 2023-11-11 ENCOUNTER — Ambulatory Visit: Admit: 2023-11-11 | Discharge: 2023-11-11 | Payer: MEDICARE | Attending: Nephrology | Primary: Nephrology

## 2023-11-11 ENCOUNTER — Ambulatory Visit: Admit: 2023-11-11 | Discharge: 2023-11-11 | Payer: MEDICARE | Attending: Adult Health | Primary: Adult Health

## 2023-11-11 ENCOUNTER — Ambulatory Visit: Admit: 2023-11-11 | Discharge: 2023-11-11 | Payer: MEDICARE

## 2023-11-11 DIAGNOSIS — N184 Chronic kidney disease, stage 4 (severe): Principal | ICD-10-CM

## 2023-11-11 DIAGNOSIS — I5032 Chronic diastolic (congestive) heart failure: Principal | ICD-10-CM

## 2023-11-11 DIAGNOSIS — I43 Cardiomyopathy in diseases classified elsewhere: Principal | ICD-10-CM

## 2023-11-11 DIAGNOSIS — I4819 Other persistent atrial fibrillation: Principal | ICD-10-CM

## 2023-11-11 DIAGNOSIS — E852 Heredofamilial amyloidosis, unspecified: Principal | ICD-10-CM

## 2023-11-11 DIAGNOSIS — E854 Organ-limited amyloidosis: Principal | ICD-10-CM

## 2023-11-11 DIAGNOSIS — J9611 Chronic respiratory failure with hypoxia: Principal | ICD-10-CM

## 2023-11-11 DIAGNOSIS — I5033 Acute on chronic diastolic (congestive) heart failure: Principal | ICD-10-CM

## 2023-11-11 LAB — CBC
HEMATOCRIT: 35.6 % (ref 34.0–44.0)
HEMOGLOBIN: 11.4 g/dL (ref 11.3–14.9)
MEAN CORPUSCULAR HEMOGLOBIN CONC: 32 g/dL (ref 32.0–36.0)
MEAN CORPUSCULAR HEMOGLOBIN: 29.3 pg (ref 25.9–32.4)
MEAN CORPUSCULAR VOLUME: 91.5 fL (ref 77.6–95.7)
MEAN PLATELET VOLUME: 9.1 fL (ref 6.8–10.7)
PLATELET COUNT: 197 10*9/L (ref 150–450)
RED BLOOD CELL COUNT: 3.89 10*12/L — ABNORMAL LOW (ref 3.95–5.13)
RED CELL DISTRIBUTION WIDTH: 13.6 % (ref 12.2–15.2)
WBC ADJUSTED: 8.6 10*9/L (ref 3.6–11.2)

## 2023-11-11 LAB — BASIC METABOLIC PANEL
ANION GAP: 9 mmol/L (ref 5–14)
BLOOD UREA NITROGEN: 60 mg/dL — ABNORMAL HIGH (ref 9–23)
BUN / CREAT RATIO: 29
CALCIUM: 9.7 mg/dL (ref 8.7–10.4)
CHLORIDE: 96 mmol/L — ABNORMAL LOW (ref 98–107)
CO2: 32.1 mmol/L — ABNORMAL HIGH (ref 20.0–31.0)
CREATININE: 2.06 mg/dL — ABNORMAL HIGH
EGFR CKD-EPI (2021) FEMALE: 24 mL/min/{1.73_m2} — ABNORMAL LOW (ref >=60)
GLUCOSE RANDOM: 199 mg/dL — ABNORMAL HIGH (ref 70–179)
POTASSIUM: 4.1 mmol/L (ref 3.4–4.8)
SODIUM: 137 mmol/L (ref 135–145)

## 2023-11-11 LAB — MAGNESIUM: MAGNESIUM: 2.1 mg/dL (ref 1.6–2.6)

## 2023-11-11 LAB — PRO-BNP: PRO-BNP: 9362 pg/mL — ABNORMAL HIGH (ref <=300.0)

## 2023-11-11 MED ORDER — AMIODARONE 200 MG TABLET
ORAL_TABLET | Freq: Every day | ORAL | 11 refills | 30 days | Status: CP
Start: 2023-11-11 — End: 2024-11-10

## 2023-11-11 MED ADMIN — vutrisiran (AMVUTTRA) syringe 25 mg: 25 mg | SUBCUTANEOUS | @ 21:00:00 | Stop: 2023-11-11

## 2023-11-11 NOTE — Unmapped (Signed)
1535 Patient presents to Infusion Center for Amuttra injection at the request of Derwood Kaplan, AGNP. Patient with daughter and presents in wheelchair in no acute distress, VSS. Patient denies any recent infection or fever.    1542 Amvuttra (vutrisiran) administered subcutaneously to right lower abdomen, patient tolerated well. Gauze and Band-Aid applied to injection site.     Patient discharged from Infusion Center in stable condition with no acute distress.

## 2023-11-11 NOTE — Unmapped (Addendum)
Today,    MEDICATIONS:  NO medication changes today.  - Continue amiodarone 200mg  once a day. We will do a cardioversion to get you in normal rhythm, hopefully next week. Continue the Eliquis twice a day and don't miss any doses  - Continue torsemide 60mg  (3 pills) a day. Increase to 80mg  if you gain >2lbs in 24hrs or 5lbs in a week. If weight is not decreasing after taking extra for a day or two, take metolazone.   Call if you have questions about your medications.    LABS:  We will call you if your labs need attention.    NEXT APPOINTMENT:  Return to clinic in 1 month with me      In general, to take care of your heart failure:  -Limit your fluid intake to 2 Liters (half-gallon) per day.    -Limit your salt intake to ideally 2-3 grams (2000-3000 mg) per day.  -Weigh yourself daily and record, and bring that weight diary to your next appointment.  (Weight gain of 2-3 pounds in 1 day typically means fluid weight.)    The medications for your heart are to help your heart and help you live longer.    Please contact us before stopping any of your heart medications.    Call the clinic at [phone] with questions.  Our clinic fax number is [phone].  If you need to reschedule future appointments, please call [phone] or [phone]  My office number (c/o De Burrs RN) is [phone] if you need further assistance.    Please do not send a MyChart message for potentially life-threatening symptoms.  Please call 911 for a true medical emergency.    To learn more about heart failure, please read [facility] Learning to Live with Heart Failure - Available online at:  [email or web address] - open the window for Medical Management and click the link Living with Heart Failure   (Can search [facility] heart failure on the web to find the link.)

## 2023-11-11 NOTE — Unmapped (Signed)
[facility]yl[facility] Note    Referring Provider: Montez Hageman, AGNP  [address]  [address]  [place],  [place] [address]   Primary Provider: Artelia Laroche, MD  [address]  [place] [address]   Other Providers:  Dr Luretha Murphy, Dr Wonda Cheng    Reason for Visit:  Barbara Huber is a 82 y.o. female being seen for routine visit and continued care of cardiac amyloidosis .    Assessment & Plan:  1. 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 SPEP/UPEP/free light chains without concern. 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 Daughter is informed and has seen cardiology [name]).   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 2nd injection July 2024. Continue vitamin A supplementation. October injection postponed in light of recent admission.   - DC'd Jardiance 10mg  daily due to recurrent UTIs  - Weight 199 today, has been stable at 194lb at home, which is likely new dry weight.   - Continue torsemide to 60mg  daily . For weight gain, take extra 20mg  PRN, and metolazone 5mg  PRN if weight is not trending down with increased torsemide  - Taking midodrine 5mg  TID with BP in 90-110s/60-70s at home. Tried stopping PM dose for concern it was contributing to insomnia, but no improvement in sleep. Sleep improved on its own. Continue to monitor BP and can increase to 10mg  for SBP <90   - Continue Tafamidis 61mg  daily   - spironolactone was stopped during admission for AKI, but she has resumed 12.5mg  daily.   - She is not interested in CardioMEMS at this time.   1b. Goals of Care  - Had a discussion last visit w/ pt and daughter [name] regarding pt's goals of care.   We discussed role of palliative care (has seen Dr Garner Nash) and hospice (when appropriate). She may wish to return to hospital if needed, but likely not ICU level care or invasive monitoring (such as swan)    2. Persistent atrial fibrillation  - CHA2DS2-VASc score = 5 (1-gender, 2-Age, 1-DM, 1-HTN)  - We had previously stopped Eliquis given recurrent anemia, shared decision making w/ pt and daughter. She was not interested in [place] at the time.   - Eliquis 2.5mg  BID restarted during hospitalization - will closely follow hemoglobin in follow up. Today Hgb is 11.4 and seems to have stabilized (13.7--> 13.6--> 11.9--> 11.6 --> 11.5 --> 10.8 --> 11.4)  - On amiodarone 200mg  daily - in AF with rates in 100s-110s today despite amiodarone.   - Now that she is back on anticoagulation, will attempt rhythm control with cardioversion. Discussed w/ Dr Wonda Cheng and EP. Otherwise not many option for rate control and/or rhythm control - she wouldn't want invasive procedures (like PPM and AVN ablation)  - amiodarone monitoring - last PFTs in 04/2021. TSH (09/23/23) /LFTs (08/31/23) stable at last check     3. [name]  - Cr baseline around 2.3-3.0  - Peaked at 5.4 during admission, 2.93 on discharge  - today Cr 2.06, K 4.1    4. COPD, OSA  - home O2  3-4L [place] - followed by pulm  - on Advair and Spiriva  - Uses trilogy at night     5. Iron deficiency anemia  - Finished IV Iron - ferritin 261. No longer anemic.  - As above, follow hemoglobin now that she is back on Eliquis   Lab Results   Component Value Date    FERRITIN 336.3 (H) 08/19/2023     6. Anxiety  - previously on Zoloft  - Tried Remeron, but discontinued in setting of worsening HF symptoms    7. DM  - Off Insulin with A1c <7  - Having some higher BG readings at home >180 - ok to continue to monitor for now given last A1c result  Lab Results   Component Value Date    A1C 5.8 (H) 08/30/2023     8. Acute on chronic back pain  - oxycontin 10mg  BID changed to oxycodone 5mg  BID during admission  - pain is not well controlled - likely exacerbated by hospital stay/hospital bed. Not getting any sleep  - She has been referred for home PT  - Has follow up scheduled w/ pain doctor in November (Dr Ronita Hipps)   - Retry flexeril 5mg  nightly, which she tolerated previously during last flare up of her back pain (feb-May 2024)    Follow-up:  Return in about 1 month (around 12/11/2023) for Return HF.     History of Present Illness:  Barbara Huber is a 82 y.o. female with PMHx of COPD on 3L home [place], CHF, [name], [name], [name] She was initially admitted with decompensated [name] in 2021 in setting of  Septic Shock d/t Gangrenous Cholecystitis s/p cholecystectomy. She underwent further evaluation with PYP scan which showed TTR amyloid. She has since been followed in clinic for management of her [name]. In 2022, had numerous admissions for weakness/urosepsis, shortness of breath/orthopnea/weight gain, lightheadedness/SOB/Blurry vision found to be anemic.    Summary of most recent visits:  - 01/16/22 [facility]): Feeling well from a cardiac standpoint. Started Zoloft for anxiety  - 02/13/22 [facility]): Weight 210-211. Feeling well. Taking extra torsemide 20mg  about every other day, which was stopped for elevated Cr.   - 03/13/22 ([name]): Weight 201-216, took metolazone PRN, weight stabilized at 210lbs. Considering cardioMEMS.   - 05/13/22 ([name]): Had 3 iron infusions. Doing well w/ volume, weight 211-212lbs. Cooking, staying active around house.   - 05/21/22 ([name]): CP/SOB. Weight 217 from 213. She took metolazone x1 dose.   - 05/22/22 ([name]): Ed follow up. Back down to 213lbs. Still having CP w/ certain movement.   - 06/05/22 ([name]): Weight 214-216lb, should be 212lb.   - 06/09/22, 06/11/22, 06/22/22 ([name]): Weight down from 217 to 213lb, 211lb at home. Feeling well by 6/26.   - 07/15/22 ([name]): Switched inhaler back to Advair/Spiriva with improvement in breathing and O2 sat. Weight 207lbs, feeling more like herself.   - 08/19/22 ([name]): Feeling well. Weight 208-210lbs.   - 10/22/22 ([name]): Feeling well. Weight has been 210-214lbs.   - 12/04/22 ([name]): doing well overall, weight a bit up so taking extra torsemide.   - 02/10/23 ([name]): Weight 213-214lbs, feeling well, eating a lot. Had taken metolazone earlier in the week.   - 02/23/23 [facility]): Hospital follow up after mechanical fall with nondisplaced fractures of the left superior and inferior pubic rami. Taking torsemide 40mg , weight 211.   - 04/14/23 ([name]): Weight 210-212. 1st Amvuttra  - 07/14/23 [facility]): Weight 211-212 on torsemide 60mg  daily. 2nd  Amvuttra.   - 08/18/23-08/23/23 (Admission): fatigue, weakness, DOE, recent prednisone course for gout. Admitted to [facility], poor response to IV Lasix and was changed to IV Bumex 3mg  BID and metolazone. [name] held for low BP. Cr increased to 4.0 with diuresis. She went back to the ER on 8/24 for abdominal pain. She was felt to be euvolemic at a weight of 207lb and IV diuretics were held. She was discharged on torsemide 60mg  daily on 8/26.   - 08/27/23 [facility]): hospital follow up. Felt to be dry for weight down to 204 at home, Cr 3.4 BUN 96. Changed torsemide from 60 daily to 40/20.   - 08/30/23-09/02/23 (Admission): Admitted for fatigue and SOB. Diuresed  with improvement in dyspnea. Weight on discharge was 204.   - 09/06/23 [facility]): Hospital follow up. Her weight continued to decrease on her scale by about 1lb per day. Weight 201 that morning. She was a little lightheaded and felt weak. Continued on torsemide 60mg  daily.  Cr 2.61, pro-BNP 4575  - 09/08/23 ([name]): Weight 201, breathing well.   - 09/16/23 ([facility]): Weight 205, not peeing much with torsemide 60mg . Feeling full in belly. Cr 3.11,  pro-BNP 14492. Metolazone and extra 40 torsemide.   - 09/17/23 ([facility]): Weight 203, feeling better. Continue torsemide 60/40 through weekend.   - 09/23/23 ([name]): n 9/25 reporting hat she didn't pee much the night before and weight was 203lb that morning. Increased by 2 lbs overnight. Instructed to take metolazone 2.5mg  and extra 40 torsemide in the afternoon. Today her weight was up and so she requested a visit. She didn't urinate much and this morning was 205lbs. Extra torsemide 60mg  then metolazone in morning and return for diuresis clinic.   - 09/24/23 (Diuresis): IV Lasix. Arranged for direct admission.   - 9/27-10/5 (Admission): She initially underwent inotrope assisted diuresis with dopamine and furosemide gtt, which was stopped on 9/30 after episodes of hypotension requiring NE and midodrine. Norepinephrine stopped and pulmonary wedge pressure was 12 prior to pulling swan on on 10/1. Midodrine maintained to maximize preload. Furosemide gtt transitioned to spot IV lasix and then was transitioned to home torsemide 60mg  daily on 10/2. Palliative care was consulted and family decided on outpatient evaluation. She was discharged with a pro-BNP of 17K down from 26K and weight of 209lb. Discharged with new home regimen torsemide 60mg  daily, metolazone prn per clinic, midodrine 5mg  TID, Tafamidis 61mg  daily. She was continued on chronic home amiodarone 200 mg daily for atrial fibrillation and restarted on Eliquis 2.5mg  BID.   - 10/9/242 [facility]): hospital follow up. Weight 193-195. Torsemide 60mg g daily. In AF RVR by EKG. Amio increased to 200mg  BID.   10/27/23 ([name]): Weight up to 201 but had been 196-197.    Interval history  Today she presents for follow up. Since her last visit, she had an [name] for hypoxia  11/5 due to malfunction of her portable O2. Symptoms resolved with BiPAP in the ED and she was discharged home. Her K was low and so spironolactone 12.5mg  daily was restarted. Since discharge, she is doing fine. Her weight is staying around 194lbs on torsemide 60mg  daily. She took 80mg  a day until weight was down and then reduced it to 60mg . She is taking midodrine TID, BP fluctuates from 80s/50s to 110s/70s. It does seem to get higher when she gets up and walks, such as to the bathroom. She is sleeping better than she was; back to using her Trilogy machine which she  had stopped using around time of hospitalization in September. She's been back on it for about a week now.     Cardiovascular History & Procedures:    Cath / PCI:  none    CV Surgery:   none    EP Procedures and Devices:  none    Non-Invasive Evaluation(s):    Echo:  08/19/23  1. The left ventricle is normal in size with moderately increased wall  thickness.    2. 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    3. The left ventricular systolic function is hyperdynamic, LVEF is visually  estimated at >70%.    4. Mitral annular calcification is present (moderate).    5. The mitral valve leaflets are mildly thickened with normal leaflet  mobility.    6. The aortic valve is trileaflet with mildly thickened leaflets with normal  excursion.    7. The left atrium is moderately dilated in size.    8. The right ventricle is normal in size, with normal systolic function.    02/17/23    1. The left ventricle is normal in size with moderately increased wall  thickness.    2. The left ventricular systolic function is hyperdynamic, LVEF is visually  estimated at >70%.    3. The left atrium is moderately dilated in size.    4. The right ventricle is normal in size, with normal systolic function.    5. There is mild pulmonary hypertension.    12/09/21  Summary    1. The left ventricle is normal in size with severely increased wall  thickness.    2. The left ventricular systolic function is hyperdynamic, LVEF is visually  estimated at >70%.    3. There is grade III diastolic dysfunction (severely elevated filling  pressure).    4. The left atrium is moderately to severely dilated in size.    5. The right ventricle is mildly dilated in size, with normal systolic  function.    6. The right atrium is mildly dilated  in size.    7. IVC size and inspiratory change suggest elevated right atrial pressure.  (10-20 mmHg).    04/24/21   1. The left ventricle is normal in size with mildly increased wall  thickness.    2. The left ventricular systolic function is normal, LVEF is visually  estimated at > 55%.    3. There is grade II diastolic dysfunction (elevated filling pressure).    4. Mitral annular calcification is present (mild).    5. The left atrium is mildly dilated in size.    6. The right ventricle is mildly dilated in size, with low normal systolic  function.    7. IVC size and inspiratory change suggest elevated right atrial pressure.  (10-20 mmHg).    09/30/20  Summary    1. Limited study to assess systolic function.    2. IVC size and inspiratory change suggest normal right atrial pressure.  (0-5 mmHg).    3. The left ventricle is normal in size with mildly to moderately increased  wall thickness.    4. The left ventricular systolic function is normal with no obvious wall  motion abnormalities, LVEF is visually estimated at > 55%.    5. Mitral annular calcification is present.    6. The left atrium is mildly dilated in size.    7. The right ventricle is upper normal in size, with reduced systolic  Function.  Cardiac CT/MRI/Nuclear Tests:   None    6 Minute Walk:  None    Cardiopulmonary Stress Tests:   None               Other Past Medical History:  See below for the complete EPIC list of past medical and surgical history.      Allergies:  Nitrofurantoin and Lipitor [atorvastatin]    Current Medications:  Current Outpatient Medications   Medication Sig Dispense Refill    acetaminophen (TYLENOL) 500 MG tablet Take 2 tablets (1,000 mg total) by mouth Three (3) times a day as needed for pain.      albuterol HFA 90 mcg/actuation inhaler Inhale 2 puffs every eight (8) hours as needed for wheezing.      apixaban (ELIQUIS) 2.5 mg Tab Take 1 tablet (2.5 mg total) by mouth two (2) times a day. 60 tablet 2 cranberry 500 mg cap Take 500 mg by mouth daily with evening meal.      cyclobenzaprine (FLEXERIL) 5 MG tablet Take 1 tablet (5 mg total) by mouth two (2) times a day as needed for muscle spasms. 60 tablet 0    ergocalciferol-1,250 mcg, 50,000 unit, (DRISDOL) 1,250 mcg (50,000 unit) capsule Take 1 capsule (1,250 mcg total) by mouth once a week. 12 capsule 3    estradioL (ESTRACE) 0.01 % (0.1 mg/gram) vaginal cream Insert 2 g into the vagina Two (2) times a week. 42.5 g 3    ferrous sulfate 325 (65 FE) MG EC tablet Take 1 tablet (325 mg total) by mouth Every Monday, Wednesday, and Friday. 36 tablet 3    fluticasone propion-salmeterol (ADVAIR HFA) 115-21 mcg/actuation inhaler Inhale 2 puffs two (2) times a day. 12 g 11    fosfomycin (MONUROL) 3 gram Pack Take 3 g by mouth once a week. 12 g 3    gabapentin (NEURONTIN) 100 MG capsule Take 1 capsule (100 mg total) by mouth two (2) times a day. 60 capsule 11    lidocaine (ASPERCREME) 4 % patch Place 1 patch on the skin daily. Remove after 12 hours. 30 patch 2    metOLazone (ZAROXOLYN) 5 MG tablet Take 1 tablet (5 mg total) by mouth daily as needed (up to 3x weekly when instructed by cardiology clinic).      midodrine (PROAMATINE) 5 MG tablet Take 1 tablet (5 mg total) by mouth three (3) times a day. 90 tablet 2    montelukast (SINGULAIR) 10 mg tablet Take 1 tablet (10 mg total) by mouth every morning. 90 tablet 2    NARCAN 4 mg/actuation nasal spray 1 spray into alternating nostrils once as needed (opioid overdose). PRN - Emergency use.      oxyCODONE (ROXICODONE) 5 MG immediate release tablet Take 1 tablet (5 mg total) by mouth every eight (8) hours as needed for pain (moderate to severe pain). 15 tablet 0    OXYGEN-AIR DELIVERY SYSTEMS MISC 5 L by Miscellaneous route. Currently using 3  L/min via [place]      pantoprazole (PROTONIX) 20 MG tablet TAKE 1 TABLET(20 MG) BY MOUTH DAILY 90 tablet 3    rosuvastatin (CRESTOR) 5 MG tablet TAKE 1 TABLET EVERY OTHER DAY 24 tablet 1 spironolactone (ALDACTONE) 25 MG tablet Take 0.5 tablets (12.5 mg total) by mouth daily. 30 tablet 2    tafamidis (VYNDAMAX) 61 mg cap Take 1 capsule (61 mg) by mouth daily. 30 capsule 11    tiotropium bromide (SPIRIVA RESPIMAT) 2.5 mcg/actuation inhalation mist Inhale 2 puffs  daily. 4 g 11    torsemide (DEMADEX) 20 MG tablet Take 3 tablets (60 mg total) by mouth daily. May also take 1 tablet (20 mg total) daily as needed (weight gain). 120 tablet 11    traZODone (DESYREL) 50 MG tablet Take 1 tablet (50 mg total) by mouth nightly as needed for sleep. 30 tablet 0    vitamin A-3,000 mcg RAE, 10,000 UNIT, 3,000 mcg RAE (10,000 UNIT) capsule Take 1 capsule (3,000 mcg of RAE total) by mouth daily.      amiodarone (PACERONE) 200 MG tablet Take 1 tablet (200 mg total) by mouth daily. 30 tablet 11    diclofenac sodium (VOLTAREN) 1 % gel Apply 2 g topically four (4) times a day as needed (hand pain and neck pain). (Patient not taking: Reported on 11/11/2023) 350 g 2    vutrisiran (AMVUTTRA) 25 mg/0.5 mL injection Inject 0.5 mL (25 mg total) under the skin once. Every 12 Weeks (Patient not taking: Reported on 11/11/2023)       No current facility-administered medications for this visit.       Family History:  The patient's family history includes Cancer in her father; Hypertension in her mother; No Known Problems in her brother, brother, maternal aunt, maternal grandfather, maternal grandmother, maternal uncle, paternal aunt, paternal grandfather, paternal grandmother, paternal uncle, sister, sister, and another family member.    Social history:  She  reports that she quit smoking about 5 years ago. Her smoking use included cigarettes. She has never used smokeless tobacco. She reports that she does not drink alcohol and does not use drugs.    Review of Systems:  As per HPI.  Rest of the review of ten systems is negative or unremarkable except as stated above.    Physical Exam:  VITAL SIGNS:   Vitals:    11/11/23 1404   BP: 120/77   Pulse: 111   SpO2: 99%       Wt Readings from Last 3 Encounters:   11/11/23 90.3 kg (199 lb)   11/11/23 90.3 kg (199 lb)   11/11/23 90.3 kg (199 lb)      Today's Body mass index is 34.16 kg/m??.   Height: 162.6 cm (5' 4)  CONSTITUTIONAL: well-appearing in no acute distress  EYES: Conjunctivae and sclerae clear and anicteric.  ENT: Benign.   CARDIOVASCULAR: JVP  2cm up neck with HOB at 90degrees, +AJR. Rate and rhythm are irregularly irregular.  There is no lifts or heaves.  Normal S1, S2. There is no murmur, gallops or rubs.  Radial and pedal pulses are 2+, bilaterally.   There is trace-1+ edema to ankles, left greater than right.   RESPIRATORY: Decreased breath sounds at bases.  There are no wheezes.  GASTROINTESTINAL: Soft, non-tender, with audible bowel sounds. Abdomen nondistended.  Liver is nonpalpable.  SKIN: No rashes, ecchymosis or petechiae.  Warm, well perfused.   MUSCULOSKELETAL:  no joint swelling   NEURO/PSYCH: Appropriate mood and affect. Alert and oriented to person, place, and time. No gross motor or sensory deficits evident.    Pertinent Laboratory Studies:   Office Visit on 11/11/2023   Component Date Value Ref Range Status    Sodium 11/11/2023 137  135 - 145 mmol/L Final    Potassium 11/11/2023 4.1  3.4 - 4.8 mmol/L Final    Chloride 11/11/2023 96 (L)  98 - 107 mmol/L Final    CO2 11/11/2023 32.1 (H)  20.0 - 31.0 mmol/L Final    Anion Gap 11/11/2023  9  5 - 14 mmol/L Final    BUN 11/11/2023 60 (H)  9 - 23 mg/dL Final    Creatinine 16/09/9603 2.06 (H)  0.55 - 1.02 mg/dL Final    BUN/Creatinine Ratio 11/11/2023 29   Final    eGFR [name]-EPI (2021) Female 11/11/2023 24 (L)  >=60 mL/min/1.21m2 Final    Glucose 11/11/2023 199 (H)  70 - 179 mg/dL Final    Calcium 54/08/8118 9.7  8.7 - 10.4 mg/dL Final    Magnesium 14/78/2956 2.1  1.6 - 2.6 mg/dL Final    PRO-BNP 21/30/8657 9,362.0 (H)  <=300.0 pg/mL Final    WBC 11/11/2023 8.6  3.6 - 11.2 10*9/L Final    RBC 11/11/2023 3.89 (L)  3.95 - 5.13 10*12/L Final    HGB 11/11/2023 11.4  11.3 - 14.9 g/dL Final    HCT 84/69/6295 35.6  34.0 - 44.0 % Final    MCV 11/11/2023 91.5  77.6 - 95.7 fL Final    MCH 11/11/2023 29.3  25.9 - 32.4 pg Final    MCHC 11/11/2023 32.0  32.0 - 36.0 g/dL Final    RDW 28/41/3244 13.6  12.2 - 15.2 % Final    MPV 11/11/2023 9.1  6.8 - 10.7 fL Final    Platelet 11/11/2023 197  150 - 450 10*9/L Final   Admission on 11/01/2023, Discharged on 11/02/2023   Component Date Value Ref Range Status    EKG Ventricular Rate 11/01/2023 111  BPM Final    EKG QRS Duration 11/01/2023 130  ms Final    EKG Q-T Interval 11/01/2023 394  ms Final    EKG QTC Calculation 11/01/2023 535  ms Final    EKG Calculated R Axis 11/01/2023 217  degrees Final    EKG Calculated T Axis 11/01/2023 7  degrees Final    QTC Fredericia 11/01/2023 483  ms Final    PRO-BNP 11/01/2023 6,696.0 (H)  <=300.0 pg/mL Final    Sodium 11/01/2023 134 (L)  135 - 145 mmol/L Final    Potassium 11/01/2023 3.4  3.4 - 4.8 mmol/L Final    Chloride 11/01/2023 99  98 - 107 mmol/L Final    CO2 11/01/2023 30.0  20.0 - 31.0 mmol/L Final    Anion Gap 11/01/2023 5  5 - 14 mmol/L Final    BUN 11/01/2023 40 (H)  9 - 23 mg/dL Final    Creatinine 12/30/7251 2.07 (H)  0.55 - 1.02 mg/dL Final    BUN/Creatinine Ratio 11/01/2023 19   Final    eGFR [name]-EPI (2021) Female 11/01/2023 24 (L)  >=60 mL/min/1.4m2 Final    Glucose 11/01/2023 152  70 - 179 mg/dL Final    Calcium 66/44/0347 8.8  8.7 - 10.4 mg/dL Final    Albumin 42/59/5638 3.2 (L)  3.4 - 5.0 g/dL Final    Total Protein 11/01/2023 6.6  5.7 - 8.2 g/dL Final    Total Bilirubin 11/01/2023 0.7  0.3 - 1.2 mg/dL Final    AST 75/64/3329 24  <=34 U/L Final    ALT 11/01/2023 8 (L)  10 - 49 U/L Final    Alkaline Phosphatase 11/01/2023 164 (H)  46 - 116 U/L Final    hsTroponin I 11/01/2023 58 (HH)  <=34 ng/L Final    WBC 11/01/2023 7.2  3.6 - 11.2 10*9/L Final    RBC 11/01/2023 3.86 (L)  3.95 - 5.13 10*12/L Final    HGB 11/01/2023 11.5  11.3 - 14.9 g/dL Final    HCT 51/88/4166 35.5  34.0 -  44.0 % Final    MCV 11/01/2023 92.0  77.6 - 95.7 fL Final    MCH 11/01/2023 29.7  25.9 - 32.4 pg Final    MCHC 11/01/2023 32.3  32.0 - 36.0 g/dL Final    RDW 28/41/3244 14.1  12.2 - 15.2 % Final    MPV 11/01/2023 9.0  6.8 - 10.7 fL Final    Platelet 11/01/2023 154  150 - 450 10*9/L Final    Neutrophils % 11/01/2023 73.0  % Final    Lymphocytes % 11/01/2023 13.3  % Final    Monocytes % 11/01/2023 11.0  % Final    Eosinophils % 11/01/2023 1.7  % Final    Basophils % 11/01/2023 1.0  % Final    Absolute Neutrophils 11/01/2023 5.2  1.8 - 7.8 10*9/L Final    Absolute Lymphocytes 11/01/2023 1.0 (L)  1.1 - 3.6 10*9/L Final    Absolute Monocytes 11/01/2023 0.8  0.3 - 0.8 10*9/L Final    Absolute Eosinophils 11/01/2023 0.1  0.0 - 0.5 10*9/L Final    Absolute Basophils 11/01/2023 0.1  0.0 - 0.1 10*9/L Final    Specimen Source 11/01/2023 Venous   Final    FIO2 Venous 11/01/2023 Not Specified   Final    pH, Venous 11/01/2023 7.37  7.32 - 7.43 Final    pCO2, Ven 11/01/2023 57  40 - 60 mm Hg Final    pO2, Ven 11/01/2023 35  30 - 55 mm Hg Final    HCO3, Ven 11/01/2023 33 (H)  22 - 27 mmol/L Final    Base Excess, Ven 11/01/2023 5.6 (H)  -2.0 - 2.0 Final    O2 Saturation, Venous 11/01/2023 64.1  40.0 - 85.0 % Final    Lactate, Venous 11/01/2023 1.9 (H)  0.5 - 1.8 mmol/L Final    hsTroponin I 11/01/2023 105 (HH)  <=34 ng/L Final    delta hsTroponin I 11/01/2023 47 (HH)  <=7 ng/L Final    hsTroponin I 11/01/2023 199 (HH)  <=34 ng/L Final    delta hsTroponin I 11/01/2023 94 (HH)  <=7 ng/L Final    SARS-CoV-2 PCR 11/01/2023 Negative  Negative Final    Influenza A 11/01/2023 Negative  Negative Final    Influenza B 11/01/2023 Negative  Negative Final    RSV 11/01/2023 Negative  Negative Final    hsTroponin I 11/02/2023 218 (HH)  <=34 ng/L Final    hsTroponin I 11/02/2023 207 (HH)  <=34 ng/L Final    Sodium 11/02/2023 135  135 - 145 mmol/L Final    Potassium 11/02/2023 3.3 (L)  3.4 - 4.8 mmol/L Final    Chloride 11/02/2023 98  98 - 107 mmol/L Final    CO2 11/02/2023 33.0 (H)  20.0 - 31.0 mmol/L Final    Anion Gap 11/02/2023 4 (L)  5 - 14 mmol/L Final    BUN 11/02/2023 46 (H)  9 - 23 mg/dL Final    Creatinine 12/30/7251 2.27 (H)  0.55 - 1.02 mg/dL Final    BUN/Creatinine Ratio 11/02/2023 20   Final    eGFR [name]-EPI (2021) Female 11/02/2023 21 (L)  >=60 mL/min/1.30m2 Final    Glucose 11/02/2023 85  70 - 179 mg/dL Final    Calcium 66/44/0347 9.1  8.7 - 10.4 mg/dL Final    Lactate, Venous 11/02/2023 1.1  0.5 - 1.8 mmol/L Final    Magnesium 11/02/2023 2.2  1.6 - 2.6 mg/dL Final    Phosphorus 42/59/5638 4.8  2.4 - 5.1 mg/dL Final    WBC  11/02/2023 5.5  3.6 - 11.2 10*9/L Final    RBC 11/02/2023 3.59 (L)  3.95 - 5.13 10*12/L Final    HGB 11/02/2023 10.8 (L)  11.3 - 14.9 g/dL Final    HCT 09/81/1914 32.6 (L)  34.0 - 44.0 % Final    MCV 11/02/2023 91.0  77.6 - 95.7 fL Final    MCH 11/02/2023 30.2  25.9 - 32.4 pg Final    MCHC 11/02/2023 33.2  32.0 - 36.0 g/dL Final    RDW 78/29/5621 14.4  12.2 - 15.2 % Final    MPV 11/02/2023 9.1  6.8 - 10.7 fL Final    Platelet 11/02/2023 148 (L)  150 - 450 10*9/L Final    Neutrophils % 11/02/2023 66.1  % Final    Lymphocytes % 11/02/2023 17.3  % Final    Monocytes % 11/02/2023 13.6  % Final    Eosinophils % 11/02/2023 2.1  % Final    Basophils % 11/02/2023 0.9  % Final    Absolute Neutrophils 11/02/2023 3.6  1.8 - 7.8 10*9/L Final    Absolute Lymphocytes 11/02/2023 1.0 (L)  1.1 - 3.6 10*9/L Final    Absolute Monocytes 11/02/2023 0.8  0.3 - 0.8 10*9/L Final    Absolute Eosinophils 11/02/2023 0.1  0.0 - 0.5 10*9/L Final    Absolute Basophils 11/02/2023 0.1  0.0 - 0.1 10*9/L Final   Office Visit on 10/27/2023   Component Date Value Ref Range Status    EKG Ventricular Rate 10/27/2023 112  BPM Final    EKG QRS Duration 10/27/2023 130  ms Final    EKG Q-T Interval 10/27/2023 394  ms Final    EKG QTC Calculation 10/27/2023 537  ms Final    EKG Calculated R Axis 10/27/2023 215  degrees Final    EKG Calculated T Axis 10/27/2023 44  degrees Final    QTC Fredericia 10/27/2023 485  ms Final    WBC 10/27/2023 5.9  3.6 - 11.2 10*9/L Final    RBC 10/27/2023 3.94 (L)  3.95 - 5.13 10*12/L Final    HGB 10/27/2023 11.6  11.3 - 14.9 g/dL Final    HCT 30/86/5784 36.4  34.0 - 44.0 % Final    MCV 10/27/2023 92.4  77.6 - 95.7 fL Final    MCH 10/27/2023 29.6  25.9 - 32.4 pg Final    MCHC 10/27/2023 32.0  32.0 - 36.0 g/dL Final    RDW 69/62/9528 15.0  12.2 - 15.2 % Final    MPV 10/27/2023 9.0  6.8 - 10.7 fL Final    Platelet 10/27/2023 158  150 - 450 10*9/L Final    Magnesium 10/27/2023 2.1  1.6 - 2.6 mg/dL Final    PRO-BNP 41/32/4401 9,769.0 (H)  <=300.0 pg/mL Final    Sodium 10/27/2023 139  135 - 145 mmol/L Final    Potassium 10/27/2023 4.0  3.4 - 4.8 mmol/L Final    Chloride 10/27/2023 100  98 - 107 mmol/L Final    CO2 10/27/2023 30.6  20.0 - 31.0 mmol/L Final    Anion Gap 10/27/2023 8  5 - 14 mmol/L Final    BUN 10/27/2023 57 (H)  9 - 23 mg/dL Final    Creatinine 02/72/5366 2.13 (H)  0.55 - 1.02 mg/dL Final    BUN/Creatinine Ratio 10/27/2023 27   Final    eGFR [name]-EPI (2021) Female 10/27/2023 23 (L)  >=60 mL/min/1.25m2 Final    Glucose 10/27/2023 127  70 - 179 mg/dL Final    Calcium 44/02/4741  9.7  8.7 - 10.4 mg/dL Final    Color, UA 95/62/1308 Light Yellow   Final    Clarity, UA 10/27/2023 Clear   Final    Specific Gravity, UA 10/27/2023 1.012  1.003 - 1.030 Final    pH, UA 10/27/2023 5.0  5.0 - 9.0 Final    Leukocyte Esterase, UA 10/27/2023 Negative  Negative Final    Nitrite, UA 10/27/2023 Negative  Negative Final    Protein, UA 10/27/2023 Negative  Negative Final    Glucose, UA 10/27/2023 Negative  Negative Final    Ketones, UA 10/27/2023 Negative  Negative Final    Urobilinogen, UA 10/27/2023 <2.0 mg/dL  <6.5 mg/dL Final    Bilirubin, UA 10/27/2023 Negative  Negative Final    Blood, UA 10/27/2023 Small (A)  Negative Final    RBC, UA 10/27/2023 5 (H)  <=4 /HPF Final    WBC, UA 10/27/2023 1  0 - 5 /HPF Final    Squam Epithel, UA 10/27/2023 5  0 - 5 /HPF Final    Bacteria, UA 10/27/2023 Occasional (A)  None Seen /HPF Final    Hyaline Casts, UA 10/27/2023 43 (H)  0 - 1 /LPF Final    Mucus, UA 10/27/2023 Rare (A)  None Seen /HPF Final    Urine Culture, Comprehensive 10/27/2023 Mixed Urogenital Flora   Final   Appointment on 10/15/2023   Component Date Value Ref Range Status    Color, UA 10/15/2023 Light Yellow   Final    Clarity, UA 10/15/2023 Clear   Final    Specific Gravity, UA 10/15/2023 1.010  1.003 - 1.030 Final    pH, UA 10/15/2023 5.0  5.0 - 9.0 Final    Leukocyte Esterase, UA 10/15/2023 Negative  Negative Final    Nitrite, UA 10/15/2023 Negative  Negative Final    Protein, UA 10/15/2023 Negative  Negative Final    Glucose, UA 10/15/2023 Negative  Negative Final    Ketones, UA 10/15/2023 Negative  Negative Final    Urobilinogen, UA 10/15/2023 <2.0 mg/dL  <7.8 mg/dL Final    Bilirubin, UA 10/15/2023 Negative  Negative Final    Blood, UA 10/15/2023 Small (A)  Negative Final    RBC, UA 10/15/2023 1  <=4 /HPF Final    WBC, UA 10/15/2023 2  0 - 5 /HPF Final    Squam Epithel, UA 10/15/2023 2  0 - 5 /HPF Final    Bacteria, UA 10/15/2023 Few (A)  None Seen /HPF Final    Hyaline Casts, UA 10/15/2023 17 (H)  0 - 1 /LPF Final    Mucus, UA 10/15/2023 Occasional (A)  None Seen /HPF Final    Urine Culture, Comprehensive 10/15/2023 >100,000 CFU/mL Klebsiella pneumoniae (A)   Final   Office Visit on 10/06/2023   Component Date Value Ref Range Status    EKG Ventricular Rate 10/06/2023 112  BPM Final    EKG QRS Duration 10/06/2023 128  ms Final    EKG Q-T Interval 10/06/2023 400  ms Final    EKG QTC Calculation 10/06/2023 546  ms Final    EKG Calculated R Axis 10/06/2023 -85  degrees Final    EKG Calculated T Axis 10/06/2023 89  degrees Final    QTC Fredericia 10/06/2023 492  ms Final    Sodium 10/06/2023 130 (L)  135 - 145 mmol/L Final    Potassium 10/06/2023 3.1 (L)  3.4 - 4.8 mmol/L Final    Chloride 10/06/2023 94 (L)  98 - 107 mmol/L Final  CO2 10/06/2023 25.2  20.0 - 31.0 mmol/L Final    Anion Gap 10/06/2023 11  5 - 14 mmol/L Final    BUN 10/06/2023 110 (H)  9 - 23 mg/dL Final    Creatinine 16/09/9603 2.37 (H)  0.55 - 1.02 mg/dL Final    BUN/Creatinine Ratio 10/06/2023 46   Final    eGFR [name]-EPI (2021) Female 10/06/2023 20 (L)  >=60 mL/min/1.9m2 Final    Glucose 10/06/2023 244 (H)  70 - 179 mg/dL Final    Calcium 54/08/8118 9.9  8.7 - 10.4 mg/dL Final    PRO-BNP 14/78/2956 14,251.0 (H)  <=300.0 pg/mL Final    Magnesium 10/06/2023 2.1  1.6 - 2.6 mg/dL Final   No results displayed because visit has over 200 results.      Office Visit on 09/23/2023   Component Date Value Ref Range Status    Sodium 09/23/2023 131 (L)  135 - 145 mmol/L Final    Potassium 09/23/2023 4.7  3.4 - 4.8 mmol/L Final    Chloride 09/23/2023 98  98 - 107 mmol/L Final    CO2 09/23/2023 23.4  20.0 - 31.0 mmol/L Final    Anion Gap 09/23/2023 10  5 - 14 mmol/L Final    BUN 09/23/2023 100 (H)  9 - 23 mg/dL Final    Creatinine 21/30/8657 4.46 (H)  0.55 - 1.02 mg/dL Final    BUN/Creatinine Ratio 09/23/2023 22   Final    eGFR [name]-EPI (2021) Female 09/23/2023 9 (L)  >=60 mL/min/1.59m2 Final    Glucose 09/23/2023 190 (H)  70 - 179 mg/dL Final    Calcium 84/69/6295 9.5  8.7 - 10.4 mg/dL Final    PRO-BNP 28/41/3244 17,133.0 (H)  <=300.0 pg/mL Final    Magnesium 09/23/2023 2.4  1.6 - 2.6 mg/dL Final    Free T4 12/30/7251 1.39  0.89 - 1.76 ng/dL Final    TSH 66/44/0347 2.346  0.550 - 4.780 uIU/mL Final   Appointment on 09/16/2023   Component Date Value Ref Range Status    Magnesium 09/16/2023 2.4  1.6 - 2.6 mg/dL Final    Sodium 42/59/5638 136  135 - 145 mmol/L Final    Potassium 09/16/2023 4.7  3.4 - 4.8 mmol/L Final    Chloride 09/16/2023 103  98 - 107 mmol/L Final    CO2 09/16/2023 22.1  20.0 - 31.0 mmol/L Final    Anion Gap 09/16/2023 11  5 - 14 mmol/L Final    BUN 09/16/2023 89 (H)  9 - 23 mg/dL Final    Creatinine 75/64/3329 3.11 (H)  0.55 - 1.02 mg/dL Final    BUN/Creatinine Ratio 09/16/2023 29   Final    eGFR [name]-EPI (2021) Female 09/16/2023 15 (L)  >=60 mL/min/1.78m2 Final    Glucose 09/16/2023 172  70 - 179 mg/dL Final    Calcium 51/88/4166 9.4  8.7 - 10.4 mg/dL Final    PRO-BNP 06/26/1600 14,492.0 (H)  <=300.0 pg/mL Final   Office Visit on 09/06/2023   Component Date Value Ref Range Status    EKG Ventricular Rate 09/06/2023 69  BPM Final    EKG Atrial Rate 09/06/2023 69  BPM Final    EKG P-R Interval 09/06/2023 194  ms Final    EKG QRS Duration 09/06/2023 114  ms Final    EKG Q-T Interval 09/06/2023 464  ms Final    EKG QTC Calculation 09/06/2023 497  ms Final    EKG Calculated P Axis 09/06/2023 75  degrees Final    EKG  Calculated R Axis 09/06/2023 -65  degrees Final    EKG Calculated T Axis 09/06/2023 62  degrees Final    QTC Fredericia 09/06/2023 486  ms Final    Sodium 09/06/2023 136  135 - 145 mmol/L Final    Potassium 09/06/2023 4.3  3.4 - 4.8 mmol/L Final    Chloride 09/06/2023 100  98 - 107 mmol/L Final    CO2 09/06/2023 25.9  20.0 - 31.0 mmol/L Final    Anion Gap 09/06/2023 10  5 - 14 mmol/L Final    BUN 09/06/2023 84 (H)  9 - 23 mg/dL Final    Creatinine 16/09/9603 2.61 (H)  0.55 - 1.02 mg/dL Final    BUN/Creatinine Ratio 09/06/2023 32   Final    eGFR [name]-EPI (2021) Female 09/06/2023 18 (L)  >=60 mL/min/1.85m2 Final    Glucose 09/06/2023 248 (H)  70 - 179 mg/dL Final    Calcium 54/08/8118 10.0  8.7 - 10.4 mg/dL Final    Magnesium 14/78/2956 2.3  1.6 - 2.6 mg/dL Final    PRO-BNP 21/30/8657 4,575.0 (H)  <=300.0 pg/mL Final   Admission on 08/30/2023, Discharged on 09/02/2023   Component Date Value Ref Range Status    Sodium 08/30/2023 134 (L)  135 - 145 mmol/L Final    Potassium 08/30/2023 4.4  3.4 - 4.8 mmol/L Final    Chloride 08/30/2023 99  98 - 107 mmol/L Final    CO2 08/30/2023 24.3  20.0 - 31.0 mmol/L Final    Anion Gap 08/30/2023 11  5 - 14 mmol/L Final    BUN 08/30/2023 103 (H)  9 - 23 mg/dL Final    Creatinine 84/69/6295 3.06 (H)  0.55 - 1.02 mg/dL Final BUN/Creatinine Ratio 08/30/2023 34   Final    eGFR [name]-EPI (2021) Female 08/30/2023 15 (L)  >=60 mL/min/1.65m2 Final    Glucose 08/30/2023 260 (H)  70 - 179 mg/dL Final    Calcium 28/41/3244 9.5  8.7 - 10.4 mg/dL Final    Albumin 12/30/7251 3.4  3.4 - 5.0 g/dL Final    Total Protein 08/30/2023 7.7  5.7 - 8.2 g/dL Final    Total Bilirubin 08/30/2023 0.4  0.3 - 1.2 mg/dL Final    AST 66/44/0347 31  <=34 U/L Final    ALT 08/30/2023 15  10 - 49 U/L Final    Alkaline Phosphatase 08/30/2023 197 (H)  46 - 116 U/L Final    PT 08/30/2023 13.2 (H)  9.9 - 12.6 sec Final    INR 08/30/2023 1.20   Final    hsTroponin I 08/30/2023 164 (HH)  <=34 ng/L Final    EKG Ventricular Rate 08/30/2023 81  BPM Final    EKG Atrial Rate 08/30/2023 83  BPM Final    EKG QRS Duration 08/30/2023 118  ms Final    EKG Q-T Interval 08/30/2023 408  ms Final    EKG QTC Calculation 08/30/2023 473  ms Final    EKG Calculated R Axis 08/30/2023 268  degrees Final    EKG Calculated T Axis 08/30/2023 63  degrees Final    QTC Fredericia 08/30/2023 450  ms Final    WBC 08/30/2023 9.0  3.6 - 11.2 10*9/L Final    RBC 08/30/2023 4.24  3.95 - 5.13 10*12/L Final    HGB 08/30/2023 12.8  11.3 - 14.9 g/dL Final    HCT 42/59/5638 39.1  34.0 - 44.0 % Final    MCV 08/30/2023 92.3  77.6 - 95.7 fL Final    MCH  08/30/2023 30.2  25.9 - 32.4 pg Final    MCHC 08/30/2023 32.7  32.0 - 36.0 g/dL Final    RDW 03/47/4259 14.5  12.2 - 15.2 % Final    MPV 08/30/2023 8.6  6.8 - 10.7 fL Final    Platelet 08/30/2023 320  150 - 450 10*9/L Final    nRBC 08/30/2023 0  <=4 /100 WBCs Final    Neutrophils % 08/30/2023 79.1  % Final    Lymphocytes % 08/30/2023 6.8  % Final    Monocytes % 08/30/2023 12.3  % Final    Eosinophils % 08/30/2023 1.0  % Final    Basophils % 08/30/2023 0.8  % Final    Absolute Neutrophils 08/30/2023 7.1  1.8 - 7.8 10*9/L Final    Absolute Lymphocytes 08/30/2023 0.6 (L)  1.1 - 3.6 10*9/L Final    Absolute Monocytes 08/30/2023 1.1 (H)  0.3 - 0.8 10*9/L Final    Absolute Eosinophils 08/30/2023 0.1  0.0 - 0.5 10*9/L Final    Absolute Basophils 08/30/2023 0.1  0.0 - 0.1 10*9/L Final    hsTroponin I 08/30/2023 162 (HH)  <=34 ng/L Final    delta hsTroponin I 08/30/2023 2  <=7 ng/L Final    Specimen Source 08/30/2023 Venous   Final    FIO2 Venous 08/30/2023 Not Specified   Final    pH, Venous 08/30/2023 7.38  7.32 - 7.43 Final    pCO2, Ven 08/30/2023 48  40 - 60 mm Hg Final    pO2, Ven 08/30/2023 41 (H)  35 - 40 mm Hg Final    HCO3, Ven 08/30/2023 26  22 - 27 mmol/L Final    Base Excess, Ven 08/30/2023 2.9 (H)  -2.0 - 2.0 Final    O2 Saturation, Venous 08/30/2023 73.3  40.0 - 85.0 % Final    BNP 08/30/2023 450.68 (H)  <=100 pg/mL Final    SARS-CoV-2 PCR 08/30/2023 Negative  Negative Final    Influenza A 08/30/2023 Negative  Negative Final    Influenza B 08/30/2023 Negative  Negative Final    RSV 08/30/2023 Negative  Negative Final    hsTroponin I 08/31/2023 173 (HH)  <=34 ng/L Final    delta hsTroponin I 08/31/2023 11 (H)  <=7 ng/L Final    Color, UA 08/31/2023 Light Yellow   Final    Clarity, UA 08/31/2023 Clear   Final    Specific Gravity, UA 08/31/2023 1.013  1.003 - 1.030 Final    pH, UA 08/31/2023 5.0  5.0 - 9.0 Final    Leukocyte Esterase, UA 08/31/2023 Small (A)  Negative Final    Nitrite, UA 08/31/2023 Negative  Negative Final    Protein, UA 08/31/2023 Negative  Negative Final    Glucose, UA 08/31/2023 Negative  Negative Final    Ketones, UA 08/31/2023 Negative  Negative Final    Urobilinogen, UA 08/31/2023 <2.0 mg/dL  <5.6 mg/dL Final    Bilirubin, UA 08/31/2023 Negative  Negative Final    Blood, UA 08/31/2023 Trace (A)  Negative Final    RBC, UA 08/31/2023 <1  <=4 /HPF Final    WBC, UA 08/31/2023 3  0 - 5 /HPF Final    Squam Epithel, UA 08/31/2023 2  0 - 5 /HPF Final    Bacteria, UA 08/31/2023 Moderate (A)  None Seen /HPF Final    Hyaline Casts, UA 08/31/2023 6 (H)  0 - 1 /LPF Final    Mucus, UA 08/31/2023 Rare (A)  None Seen /HPF Final    Creat  U 08/31/2023 75.3  Undefined mg/dL Final    Protein, Ur 96/03/5408 7.4  Undefined mg/dL Final    Protein/Creatinine Ratio, Urine 08/31/2023 0.098  Undefined Final    WBC 08/31/2023 8.2  3.6 - 11.2 10*9/L Final    RBC 08/31/2023 3.95  3.95 - 5.13 10*12/L Final    HGB 08/31/2023 11.9  11.3 - 14.9 g/dL Final    HCT 81/19/1478 36.5  34.0 - 44.0 % Final    MCV 08/31/2023 92.5  77.6 - 95.7 fL Final    MCH 08/31/2023 30.1  25.9 - 32.4 pg Final    MCHC 08/31/2023 32.6  32.0 - 36.0 g/dL Final    RDW 29/56/2130 14.8  12.2 - 15.2 % Final    MPV 08/31/2023 8.4  6.8 - 10.7 fL Final    Platelet 08/31/2023 269  150 - 450 10*9/L Final    Magnesium 08/31/2023 2.3  1.6 - 2.6 mg/dL Final    Phosphorus 86/57/8469 4.5  2.4 - 5.1 mg/dL Final    Sodium 62/95/2841 140  135 - 145 mmol/L Final    Potassium 08/31/2023 3.9  3.4 - 4.8 mmol/L Final    Chloride 08/31/2023 101  98 - 107 mmol/L Final    CO2 08/31/2023 26.2  20.0 - 31.0 mmol/L Final    Anion Gap 08/31/2023 13  5 - 14 mmol/L Final    BUN 08/31/2023 99 (H)  9 - 23 mg/dL Final    Creatinine 32/44/0102 2.82 (H)  0.55 - 1.02 mg/dL Final    BUN/Creatinine Ratio 08/31/2023 35   Final    eGFR [name]-EPI (2021) Female 08/31/2023 16 (L)  >=60 mL/min/1.65m2 Final    Glucose 08/31/2023 132  70 - 179 mg/dL Final    Calcium 72/53/6644 9.4  8.7 - 10.4 mg/dL Final    Albumin 03/47/4259 3.0 (L)  3.4 - 5.0 g/dL Final    Total Protein 08/31/2023 6.9  5.7 - 8.2 g/dL Final    Total Bilirubin 08/31/2023 0.5  0.3 - 1.2 mg/dL Final    AST 56/38/7564 28  <=34 U/L Final    ALT 08/31/2023 10  10 - 49 U/L Final    Alkaline Phosphatase 08/31/2023 174 (H)  46 - 116 U/L Final    Hemoglobin A1C 08/30/2023 5.8 (H)  4.8 - 5.6 % Final    Estimated Average Glucose 08/30/2023 120  mg/dL Final    TSH 33/29/5188 0.963  0.550 - 4.780 uIU/mL Final    T3, Free 08/31/2023 1.77 (L)  2.30 - 4.20 pg/mL Final    Free T4 08/31/2023 1.79 (H)  0.89 - 1.76 ng/dL Final    Cortisol 41/66/0630 41.5  See Comment ug/dL Final    Glucose, POC 16/12/930 150  70 - 179 mg/dL Final    Glucose, POC 35/57/3220 220 (H)  70 - 179 mg/dL Final    Glucose, POC 25/42/7062 172  70 - 179 mg/dL Final    Glucose, POC 37/62/8315 168  70 - 179 mg/dL Final    Sodium 17/61/6073 138  135 - 145 mmol/L Final    Potassium 09/01/2023 4.5  3.4 - 4.8 mmol/L Final    Chloride 09/01/2023 101  98 - 107 mmol/L Final    CO2 09/01/2023 28.8  20.0 - 31.0 mmol/L Final    Anion Gap 09/01/2023 8  5 - 14 mmol/L Final    BUN 09/01/2023 97 (H)  9 - 23 mg/dL Final    Creatinine 71/05/2693 2.87 (H)  0.55 - 1.02 mg/dL Final  BUN/Creatinine Ratio 09/01/2023 34   Final    eGFR [name]-EPI (2021) Female 09/01/2023 16 (L)  >=60 mL/min/1.62m2 Final    Glucose 09/01/2023 132  70 - 179 mg/dL Final    Calcium 09/81/1914 9.5  8.7 - 10.4 mg/dL Final    Magnesium 78/29/5621 2.3  1.6 - 2.6 mg/dL Final    WBC 30/86/5784 9.6  3.6 - 11.2 10*9/L Final    RBC 09/01/2023 3.97  3.95 - 5.13 10*12/L Final    HGB 09/01/2023 11.8  11.3 - 14.9 g/dL Final    HCT 69/62/9528 36.6  34.0 - 44.0 % Final    MCV 09/01/2023 92.2  77.6 - 95.7 fL Final    MCH 09/01/2023 29.7  25.9 - 32.4 pg Final    MCHC 09/01/2023 32.2  32.0 - 36.0 g/dL Final    RDW 41/32/4401 14.8  12.2 - 15.2 % Final    MPV 09/01/2023 8.5  6.8 - 10.7 fL Final    Platelet 09/01/2023 289  150 - 450 10*9/L Final    Glucose, POC 09/01/2023 144  70 - 179 mg/dL Final    Glucose, POC 02/72/5366 240 (H)  70 - 179 mg/dL Final    Glucose, POC 44/02/4741 142  70 - 179 mg/dL Final    Glucose, POC 59/56/3875 190 (H)  70 - 179 mg/dL Final    Sodium 64/33/2951 139  135 - 145 mmol/L Final    Potassium 09/02/2023 4.4  3.4 - 4.8 mmol/L Final    Chloride 09/02/2023 101  98 - 107 mmol/L Final    CO2 09/02/2023 26.1  20.0 - 31.0 mmol/L Final    Anion Gap 09/02/2023 12  5 - 14 mmol/L Final    BUN 09/02/2023 89 (H)  9 - 23 mg/dL Final    Creatinine 88/41/6606 3.20 (H)  0.55 - 1.02 mg/dL Final    BUN/Creatinine Ratio 09/02/2023 28   Final    eGFR [name]-EPI (2021) Female 09/02/2023 14 (L)  >=60 mL/min/1.95m2 Final    Glucose 09/02/2023 125  70 - 179 mg/dL Final    Calcium 30/16/0109 9.7  8.7 - 10.4 mg/dL Final    Magnesium 32/35/5732 2.1  1.6 - 2.6 mg/dL Final    Glucose, POC 20/25/4270 153  70 - 179 mg/dL Final    Glucose, POC 62/37/6283 251 (H)  70 - 179 mg/dL Final   There may be more visits with results that are not included.       Lab Results   Component Value Date    PRO-BNP 9,362.0 (H) 11/11/2023    PRO-BNP 6,696.0 (H) 11/01/2023    PRO-BNP 9,769.0 (H) 10/27/2023    Creatinine 2.06 (H) 11/11/2023    Creatinine 2.27 (H) 11/02/2023    Creatinine 0.79 01/06/2011    BUN 60 (H) 11/11/2023    BUN 46 (H) 11/02/2023    BUN 20 01/06/2011    Sodium 137 11/11/2023    Sodium 140 01/06/2011    Potassium 4.1 11/11/2023    Potassium 4.1 01/06/2011    CO2 32.1 (H) 11/11/2023    CO2 30 01/06/2011    Magnesium 2.1 11/11/2023    Magnesium 2.0 01/06/2011    Total Bilirubin 0.7 11/01/2023    INR 1.04 09/24/2023    INR 1.0 01/06/2011       Lab Results   Component Value Date    Digoxin Level 0.9 10/01/2020       Lab Results   Component Value Date    TSH 2.346 09/23/2023  Cholesterol 95 12/06/2021    Triglycerides 66 12/06/2021    HDL 42 12/06/2021    Non-HDL Cholesterol 53 (L) 12/06/2021    LDL Calculated 40 12/06/2021       Lab Results   Component Value Date    WBC 8.6 11/11/2023    WBC 12.5 (H) 01/06/2011    HGB 11.4 11/11/2023    HGB 12.9 01/06/2011    HCT 35.6 11/11/2023    HCT 40.8 01/06/2011    Platelet 197 11/11/2023    Platelet 260 01/06/2011       Pertinent Test Results from Today:  None    Other pertinent records were reviewed.    The following are further history from the patient's EPIC record for reference:     Past Medical History:   Diagnosis Date    Acute kidney injury superimposed on chronic kidney disease (CMS-HCC) 10/11/2020    Acute on chronic diastolic (congestive) heart failure (CMS-HCC) 08/23/2020    Acute on chronic diastolic congestive heart failure (CMS-HCC)     AKI (acute kidney injury) (CMS-HCC) 04/14/2015 Lab Results  Component  Value  Date     CREATININE  1.90 (H)  06/12/2021     Had a bump in her creatinine when she was taking her diuretics every day.  She is currently taking 40 mg daily of torsemide and 50 mg of spironolactone.  Her volume status is fragile.  Previously when she stopped her diuretic she becomes short of breath.  Plan: We will check her BMP today.  We will likely have to go to 40    Anemia 08/22/2019    Iron deficiency anemia          Lab Results      Component    Value    Date           WBC    9.2    03/21/2021           RBC    3.67 (L)    03/21/2021           HGB    9.9 (L)    03/21/2021           HCT    30.7 (L)    03/21/2021           MCV    83.7    03/21/2021           MCH    26.9    03/21/2021           MCHC    32.1    03/21/2021           RDW    21.9 (H)    03/21/2021           PLT    318     Arthritis     Calculus of kidney     Calculus of ureter     CHF (congestive heart failure) (CMS-HCC)     Chronic atrial fibrillation (CMS-HCC) 07/20/2019    COPD (chronic obstructive pulmonary disease) (CMS-HCC)     Diabetes (CMS-HCC)     Gangrenous cholecystitis 10/11/2020    Generalized edema  06/17/2021    GERD (gastroesophageal reflux disease)     Hydronephrosis     Hypertension     Hyponatremia 10/11/2020    Intermediate coronary syndrome (CMS-HCC) 03/13/2014    Lower extremity edema 09/28/2020    Lumbar stenosis     Microscopic hematuria     Nausea alone  Nephrolithiasis 04/17/2016    Neuropathy     Nocturia     Nocturia 07/01/2017    Other chronic cystitis     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  See plans for anemia, AKI  We are also tapering her gabapentin.  Currently on 300 mg 3 times daily.  We will decrease it to twice daily, and then nightly.  She states she is not having any recurrence of her pain.    Pulmonary hypertension (CMS-HCC)     Renal colic     Shortness of breath 04/28/2020    Sleep apnea     Unstable angina pectoris (CMS-HCC) 03/13/2014       Past Surgical History:   Procedure Laterality Date    BACK SURGERY  1995    CARPAL TUNNEL RELEASE Left 2014    HYSTERECTOMY  1971    IR INSERT CHOLECYSTOSMY TUBE PERCUTANEOUS  10/02/2020    IR INSERT CHOLECYSTOSMY TUBE PERCUTANEOUS 10/02/2020 Braulio Conte, MD IMG VIR [facility]    LUMBAR DISC SURGERY      PR REMOVAL GALLBLADDER N/A 10/06/2020    Procedure: CHOLECYSTECTOMY;  Surgeon: Katherina Mires, MD;  Location: MAIN OR [facility];  Service: Trauma    PR RIGHT HEART CATH O2 SATURATION & CARDIAC OUTPUT N/A 09/30/2020    Procedure: Right Heart Catheterization;  Surgeon: Neal Dy, MD;  Location: [facility] CATH;  Service: Cardiology    PR RIGHT HEART CATH O2 SATURATION & CARDIAC OUTPUT N/A 01/09/2021    Procedure: Right Heart Catheterization;  Surgeon: Lesle Reek, MD;  Location: [facility] CATH;  Service: Cardiology

## 2023-11-12 NOTE — Unmapped (Signed)
Medicines to Avoid With Kidney Disease: Care Instructions  Overview     Kidney disease means that your kidneys are not able to get rid of waste from the blood. So they can't keep your body's fluids and chemicals in balance. Usually, the kidneys get rid of waste from the blood through the urine. And they balance the fluids in the body.  When your kidneys don't work as they should, you have to be careful about some medicines. They may harm your kidneys. Your doctor may tell you not to take them or may change the dose.  Medicines for pain and swelling, such as ibuprofen (Advil or Motrin) or naproxen (Aleve), can cause harm. So can some antibiotics and antacids. And you need to be careful about some drugs that treat cancer, lower blood pressure, or get rid of water from the body. Some herbal products could cause harm too.  Follow-up care is a key part of your treatment and safety. Be sure to make and go to all appointments, and call your doctor if you are having problems. It's also a good idea to know your test results and keep a list of the medicines you take.  How can you care for yourself at home?  Tell your doctor all the prescription, herbal, or over-the-counter medicines you take. Do not take any new ones unless you talk to your doctor first.  Do not take anti-inflammatory medicines. These include ibuprofen (Advil, Motrin) and naproxen (Aleve). You can use acetaminophen (Tylenol) for pain.  Do not take two or more pain medicines at the same time unless the doctor told you to. Many pain medicines have acetaminophen, which is Tylenol. Too much acetaminophen (Tylenol) can be harmful.  Tell all doctors and others who work with your health care that you have kidney disease.  Wear medical alert jewelry that lists your health problem. You can buy this at most drugstores.  Where can you learn more?  Go to [facility] at [email or web address].Armed forces logistics/support/administrative officer in the Menu. Enter [id number] in the search box to learn more about Medicines to Avoid With Kidney Disease: Care Instructions.  Current as of: September 04, 2020               Content Version: 13.1  ?? 2006-2021 [employer].   Care instructions adapted under license by [facility]. If you have questions about a medical condition or this instruction, always ask your healthcare professional. [employer] Inc[employer] disclaims any warranty or liability for your use of this

## 2023-11-12 NOTE — Unmapped (Signed)
[facility] Clinical Assessment & Refill Coordination Note    Barbara Huber, DOB: [birth year]  Phone: [phone] (home)     All above HIPAA information was verified with patient.     Was a Nurse, learning disability used for this call? No    Specialty Medication(s):   Cardiology: Vyndamax     Current Outpatient Medications   Medication Sig Dispense Refill    acetaminophen (TYLENOL) 500 MG tablet Take 2 tablets (1,000 mg total) by mouth Three (3) times a day as needed for pain.      albuterol HFA 90 mcg/actuation inhaler Inhale 2 puffs every eight (8) hours as needed for wheezing.      amiodarone (PACERONE) 200 MG tablet Take 1 tablet (200 mg total) by mouth daily. 30 tablet 11    apixaban (ELIQUIS) 2.5 mg Tab Take 1 tablet (2.5 mg total) by mouth two (2) times a day. 60 tablet 2    cranberry 500 mg cap Take 500 mg by mouth daily with evening meal.      cyclobenzaprine (FLEXERIL) 5 MG tablet Take 1 tablet (5 mg total) by mouth two (2) times a day as needed for muscle spasms. 60 tablet 0    diclofenac sodium (VOLTAREN) 1 % gel Apply 2 g topically four (4) times a day as needed (hand pain and neck pain). (Patient not taking: Reported on 11/11/2023) 350 g 2    ergocalciferol-1,250 mcg, 50,000 unit, (DRISDOL) 1,250 mcg (50,000 unit) capsule Take 1 capsule (1,250 mcg total) by mouth once a week. 12 capsule 3    estradioL (ESTRACE) 0.01 % (0.1 mg/gram) vaginal cream Insert 2 g into the vagina Two (2) times a week. 42.5 g 3    ferrous sulfate 325 (65 FE) MG EC tablet Take 1 tablet (325 mg total) by mouth Every Monday, Wednesday, and Friday. 36 tablet 3    fluticasone propion-salmeterol (ADVAIR HFA) 115-21 mcg/actuation inhaler Inhale 2 puffs two (2) times a day. 12 g 11    fosfomycin (MONUROL) 3 gram Pack Take 3 g by mouth once a week. 12 g 3    gabapentin (NEURONTIN) 100 MG capsule Take 1 capsule (100 mg total) by mouth two (2) times a day. 60 capsule 11    lidocaine (ASPERCREME) 4 % patch Place 1 patch on the skin daily. Remove after 12 hours. 30 patch 2    metOLazone (ZAROXOLYN) 5 MG tablet Take 1 tablet (5 mg total) by mouth daily as needed (up to 3x weekly when instructed by cardiology clinic).      midodrine (PROAMATINE) 5 MG tablet Take 1 tablet (5 mg total) by mouth three (3) times a day. 90 tablet 2    montelukast (SINGULAIR) 10 mg tablet Take 1 tablet (10 mg total) by mouth every morning. 90 tablet 2    NARCAN 4 mg/actuation nasal spray 1 spray into alternating nostrils once as needed (opioid overdose). PRN - Emergency use.      OXYGEN-AIR DELIVERY SYSTEMS MISC 5 L by Miscellaneous route. Currently using 3  L/min via [place]      pantoprazole (PROTONIX) 20 MG tablet TAKE 1 TABLET(20 MG) BY MOUTH DAILY 90 tablet 3    rosuvastatin (CRESTOR) 5 MG tablet TAKE 1 TABLET EVERY OTHER DAY 24 tablet 1    spironolactone (ALDACTONE) 25 MG tablet Take 0.5 tablets (12.5 mg total) by mouth daily. 30 tablet 2    tafamidis (VYNDAMAX) 61 mg cap Take 1 capsule (61 mg) by mouth daily. 30 capsule  11    tiotropium bromide (SPIRIVA RESPIMAT) 2.5 mcg/actuation inhalation mist Inhale 2 puffs daily. 4 g 11    torsemide (DEMADEX) 20 MG tablet Take 3 tablets (60 mg total) by mouth daily. May also take 1 tablet (20 mg total) daily as needed (weight gain). 120 tablet 11    traZODone (DESYREL) 50 MG tablet Take 1 tablet (50 mg total) by mouth nightly as needed for sleep. 30 tablet 0    vitamin A-3,000 mcg RAE, 10,000 UNIT, 3,000 mcg RAE (10,000 UNIT) capsule Take 1 capsule (3,000 mcg of RAE total) by mouth daily.      vutrisiran (AMVUTTRA) 25 mg/0.5 mL injection Inject 0.5 mL (25 mg total) under the skin once. Every 12 Weeks (Patient not taking: Reported on 11/11/2023)       No current facility-administered medications for this visit.        Changes to medications: Barbara Huber reports no changes at this time.    Allergies   Allergen Reactions    Nitrofurantoin Anaphylaxis and Other (See Comments)    Lipitor [Atorvastatin] Muscle Pain     SOB, Headache, fatigue, sick on my stomach       Changes to allergies: No    SPECIALTY MEDICATION ADHERENCE     Vyndamax 61 mg: ~10 days of medicine on hand       Medication Adherence    Patient reported X missed doses in the last month: 0  Specialty Medication: Vyndamax 61mg   Informant: patient          Specialty medication(s) dose(s) confirmed: Regimen is correct and unchanged.     Are there any concerns with adherence? No    Adherence counseling provided? Not needed    CLINICAL MANAGEMENT AND INTERVENTION      Clinical Benefit Assessment:    Do you feel the medicine is effective or helping your condition? Yes    Clinical Benefit counseling provided? Not needed    Adverse Effects Assessment:    Are you experiencing any side effects? No    Are you experiencing difficulty administering your medicine? 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 For example, brushing your teeth or getting up in the morning. Patient declined to answer    Have you discussed this with your provider? Not needed    Acute Infection Status:    Acute infections noted within Epic:  No active infections  Patient reported infection: None    Therapy Appropriateness:    Is therapy appropriate based on current medication list, adverse reactions, adherence, clinical benefit and progress toward achieving therapeutic goals? Yes, therapy is appropriate and should be continued     DISEASE/MEDICATION-SPECIFIC INFORMATION      N/A    Cardiology: Not Applicable    PATIENT SPECIFIC NEEDS     Does the patient have any physical, cognitive, or cultural barriers? No    Is the patient high risk? No    Did the patient require a clinical intervention? No    Does the patient require physician intervention or other additional services (i.e., nutrition, smoking cessation, social work)? No    SOCIAL DETERMINANTS OF HEALTH     At the [facility], we have learned that life circumstances - like trouble affording food,  housing, utilities, or transportation can affect the health of many of our patients.   That is why we wanted to ask: are you currently experiencing any life circumstances that are negatively impacting your health and/or quality of life? Patient declined to answer    Social Determinants of Health     Food Insecurity: No Food Insecurity (11/02/2023)    Hunger Vital Sign     Worried About Running Out of Food in the Last Year: Never true     Ran Out of Food in the Last Year: Never true   Internet Connectivity: Not on file   Housing/Utilities: Low Risk  (11/02/2023)    Housing/Utilities     Within the past 12 months, have you ever stayed: outside, in a car, in a tent, in an overnight shelter, or temporarily in someone else's home (i.e. couch-surfing)?: No     Are you worried about losing your housing?: No     Within the past 12 months, have you been unable to get utilities (heat, electricity) when it was really needed?: No   Tobacco Use: Medium Risk (11/11/2023)    Patient History     Smoking Tobacco Use: Former     Smokeless Tobacco Use: Never     Passive Exposure: Not on file   Transportation Needs: No Transportation Needs (11/02/2023)    PRAPARE - Therapist, art (Medical): No     Lack of Transportation (Non-Medical): No   Alcohol Use: Not At Risk (03/18/2023)    Alcohol Use     How often do you have a drink containing alcohol?: Never     How many drinks containing alcohol do you have on a typical day when you are drinking?: 1 - 2     How often do you have 5 or more drinks on one occasion?: Never   Interpersonal Safety: Unknown (11/12/2023)    Interpersonal Safety     Unsafe Where You Currently Live: Not on file     Physically Hurt by Anyone: Not on file     Abused by Anyone: Not on file   Physical Activity: Unknown (11/04/2017)    Received from [facility], [facility]    Exercise Vital Sign     Days of Exercise per Week: Patient declined     Minutes of Exercise per Session: Patient declined   Intimate Partner Violence: Not At Risk (12/17/2021)    Humiliation, Afraid, Rape, and Kick questionnaire     Fear of Current or Ex-Partner: No     Emotionally Abused: No     Physically Abused: No     Sexually Abused: No   Stress: Unknown (11/04/2017)    Received from [facility], [facility]    [employer] of Occupational Health - Occupational Stress Questionnaire     Feeling of Stress : Patient declined   Substance Use: Low Risk  (06/17/2023)    Substance Use     In the past year, how often have you used prescription drugs for non-medical reasons?: Never     In the past year, how often have you used illegal drugs?: Never     In the past year, have you used any substance for non-medical reasons?: No   Social Connections: Unknown (11/04/2017)    Received from [facility], [facility]    Social Connection and Isolation Panel [NHANES]     Frequency of Communication with  Friends and Family: Patient declined     Frequency of Social Gatherings with Friends and Family: Patient declined     Attends Religious Services: Patient declined     Database administrator or Organizations: Patient declined     Attends Banker Meetings: Patient declined     Marital Status: Patient declined   Programmer, applications: Low Risk  (11/02/2023)    Overall Financial Resource Strain (CARDIA)     Difficulty of Paying Living Expenses: Not hard at all   Depression: Not at risk (03/18/2023)    PHQ-2     PHQ-2 Score: 0   Health Literacy: Medium Risk (05/02/2021)    Health Literacy     : Rarely       Would you be willing to receive help with any of the needs that you have identified today? Not applicable       SHIPPING     Specialty Medication(s) to be Shipped:   Cardiology: Vyndamax    Other medication(s) to be shipped: No additional medications requested for fill at this time     Changes to insurance: No    Delivery Scheduled: Yes, Expected medication delivery date: 11/19/23.     Medication will be delivered via Next Day Courier to the confirmed prescription address in [facility].    The patient will receive a drug information handout for each medication shipped and additional FDA Medication Guides as required.  Verified that patient has previously received a Conservation officer, historic buildings and a Surveyor, mining.    The patient or caregiver noted above participated in the development of this care plan and knows that they can request review of or adjustments to the care plan at any time.      All of the patient's questions and concerns have been addressed.    Camillo Flaming, PharmD   [facility] Specialty Pharmacist

## 2023-11-12 NOTE — Unmapped (Signed)
Hello referring Provider: -Debe Coder, Info Shawna Clamp*     PCP:  Artelia Laroche, MD      11/11/2023      ASSESSMENT/PLAN:      Barbara Huber is a 82 y.o. year old patient with a past medical history significant for diabetes mellitus, pretension, COPD and a recent diagnosis of cardiac amyloid.  She is being seen in follow-up for chronic kidney disease.     1.  Chronic kidney disease  Labs today: Creatinine 2.06, GFR 24, BUN 60, potassium 4.1    The patient has advanced chronic kidney disease, currently stage IV.  She had an episode of acute kidney injury related to cardiorenal syndrome.  She has markedly improved after an inpatient stay on the heart failure service for diuresis.  She currently is on torsemide, metolazone as needed, and spironolactone.    At this time, I am deferring management of patient's heart failure and cardiorenal syndrome to the cardiology team.  They have done a remarkable job of turning her around from when she last saw me.  It looks like she was going to need kidney replacement therapy.      2.  Diabetes mellitus type 2  She is currently on insulin.  If her kidney function continues to improve, she may be a candidate for 1 of these agents, an SGLT2 inhibitor or GLP-1 receptor agonist.  At this point in time, I will not make any changes.    3.  Hypertension  This appears to be well controlled on the current regimen of spironolactone and torsemide, and metolazone as needed.  She typically takes torsemide daily, with extra doses as instructed by cardiology.      4.  Cardiac amyloidosis  This appears to be stable, and is managed by cardiology.    5.  Atrial fibrillation  This appears to be stable.  She is on apixaban and amiodarone.    6.  Chronic obstructive pulmonary disease, O2 dependent  Stable.    7.  Anemia  This is a combination of iron deficiency and [name].  Her current hemoglobin is 11.4, at a level where she does not require ESA therapy.      Barbara Huber will follow up in 2 months.       I personally spent 41 minutes face-to-face and non-face-to-face in the care of this patient, which includes all pre, intra, and post visit time on the date of service.                 Background: Patient has chronic kidney disease stage IV plan latest EGFR= 19ml/min and cardiac amyloid.  Etiology is not clear, but risk factors include hypertension and diabetes mellitus.  Patient with a history of cardiorenal syndrome, most recently acute kidney injury and volume overload in September 2024.      HPI:  Barbara Huber presents to the [facility] nephrology clinic for scheduled follow-up.  I last saw her about 6 weeks ago.  She is again accompanied by her daughter today.    In the interval, she was hospitalized for cardiorenal syndrome with severe decline in GFR and volume overload.  She stayed from 09/24/2023 until 10/02/2023 on the cardiology heart failure service.  During that time, she was diuresed, and did not require dialytic support.  She had improvement of her kidney function, with peak serum creatinine 5.34 and GFR less than 10 improving to 2.93 with a GFR of 16 on the day prior to discharge.  She had a  second short hospital stay from 11/01/2023 until 11/02/2023, again in the heart failure service during which time she was diagnosed with a type II MI.  At the time, the patient became dyspneic and hypoxic after her portable oxygen concentrator stopped working.  She has had close follow-up with cardiology since then with gradually improving kidney function.  She has had episodes of azotemia that was watched.     She is denies any anorexia, metallic taste in her mouth, nausea or vomiting.  She reports stable urine output.  She denies any gross hematuria or discolored urine.  She denies any dysuria.  She denies any change in urinary frequency.  Otherwise as per ROS    Otherwise as per ROS      ROS:   CONSTITUTIONAL: denies fevers or chills, denies unintentional weight loss  CARDIOVASCULAR: denies chest pain, denies dyspnea on exertion, denies leg edema  GASTROINTESTINAL: denies nausea, denies vomiting, denies anorexia  GENITOURINARY: denies dysuria, denies hematuria, denies decreased urinary stream  All systems reviewed and are negative except as listed above.    PAST MEDICAL HISTORY:  Past Medical History:   Diagnosis Date    Acute kidney injury superimposed on chronic kidney disease (CMS-HCC) 10/11/2020    Acute on chronic diastolic (congestive) heart failure (CMS-HCC) 08/23/2020    Acute on chronic diastolic congestive heart failure (CMS-HCC)     AKI (acute kidney injury) (CMS-HCC) 04/14/2015    Lab Results  Component  Value  Date     CREATININE  1.90 (H)  06/12/2021     Had a bump in her creatinine when she was taking her diuretics every day.  She is currently taking 40 mg daily of torsemide and 50 mg of spironolactone.  Her volume status is fragile.  Previously when she stopped her diuretic she becomes short of breath.  Plan: We will check her BMP today.  We will likely have to go to 40    Anemia 08/22/2019    Iron deficiency anemia          Lab Results      Component    Value    Date           WBC    9.2    03/21/2021           RBC    3.67 (L)    03/21/2021           HGB    9.9 (L)    03/21/2021           HCT    30.7 (L)    03/21/2021           MCV    83.7    03/21/2021           MCH    26.9    03/21/2021           MCHC    32.1    03/21/2021           RDW    21.9 (H)    03/21/2021           PLT    318     Arthritis     Calculus of kidney     Calculus of ureter     CHF (congestive heart failure) (CMS-HCC)     Chronic atrial fibrillation (CMS-HCC) 07/20/2019    COPD (chronic obstructive pulmonary disease) (CMS-HCC)     Diabetes (CMS-HCC)     Gangrenous cholecystitis 10/11/2020  Generalized edema  06/17/2021    GERD (gastroesophageal reflux disease)     Hydronephrosis     Hypertension     Hyponatremia 10/11/2020    Intermediate coronary syndrome (CMS-HCC) 03/13/2014    Lower extremity edema 09/28/2020 Lumbar stenosis     Microscopic hematuria     Nausea alone     Nephrolithiasis 04/17/2016    Neuropathy     Nocturia     Nocturia 07/01/2017    Other chronic cystitis     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  See plans for anemia, AKI  We are also tapering her gabapentin.  Currently on 300 mg 3 times daily.  We will decrease it to twice daily, and then nightly.  She states she is not having any recurrence of her pain.    Pulmonary hypertension (CMS-HCC)     Renal colic     Shortness of breath 04/28/2020    Sleep apnea     Unstable angina pectoris (CMS-HCC) 03/13/2014            MEDICATIONS:  Current Outpatient Medications   Medication Sig Dispense Refill    acetaminophen (TYLENOL) 500 MG tablet Take 2 tablets (1,000 mg total) by mouth Three (3) times a day as needed for pain.      albuterol HFA 90 mcg/actuation inhaler Inhale 2 puffs every eight (8) hours as needed for wheezing.      amiodarone (PACERONE) 200 MG tablet Take 1 tablet (200 mg total) by mouth daily. 30 tablet 11    apixaban (ELIQUIS) 2.5 mg Tab Take 1 tablet (2.5 mg total) by mouth two (2) times a day. 60 tablet 2    cranberry 500 mg cap Take 500 mg by mouth daily with evening meal.      cyclobenzaprine (FLEXERIL) 5 MG tablet Take 1 tablet (5 mg total) by mouth two (2) times a day as needed for muscle spasms. 60 tablet 0    diclofenac sodium (VOLTAREN) 1 % gel Apply 2 g topically four (4) times a day as needed (hand pain and neck pain). (Patient not taking: Reported on 11/11/2023) 350 g 2    ergocalciferol-1,250 mcg, 50,000 unit, (DRISDOL) 1,250 mcg (50,000 unit) capsule Take 1 capsule (1,250 mcg total) by mouth once a week. 12 capsule 3    estradioL (ESTRACE) 0.01 % (0.1 mg/gram) vaginal cream Insert 2 g into the vagina Two (2) times a week. 42.5 g 3    ferrous sulfate 325 (65 FE) MG EC tablet Take 1 tablet (325 mg total) by mouth Every Monday, Wednesday, and Friday. 36 tablet 3    fluticasone propion-salmeterol (ADVAIR HFA) 115-21 mcg/actuation inhaler Inhale 2 puffs two (2) times a day. 12 g 11    fosfomycin (MONUROL) 3 gram Pack Take 3 g by mouth once a week. 12 g 3    gabapentin (NEURONTIN) 100 MG capsule Take 1 capsule (100 mg total) by mouth two (2) times a day. 60 capsule 11    lidocaine (ASPERCREME) 4 % patch Place 1 patch on the skin daily. Remove after 12 hours. 30 patch 2    metOLazone (ZAROXOLYN) 5 MG tablet Take 1 tablet (5 mg total) by mouth daily as needed (up to 3x weekly when instructed by cardiology clinic).      midodrine (PROAMATINE) 5 MG tablet Take 1 tablet (5 mg total) by mouth three (3) times a day. 90 tablet 2    montelukast (SINGULAIR) 10 mg tablet Take  1 tablet (10 mg total) by mouth every morning. 90 tablet 2    NARCAN 4 mg/actuation nasal spray 1 spray into alternating nostrils once as needed (opioid overdose). PRN - Emergency use.      oxyCODONE (ROXICODONE) 5 MG immediate release tablet Take 1 tablet (5 mg total) by mouth every eight (8) hours as needed for pain (moderate to severe pain). 15 tablet 0    OXYGEN-AIR DELIVERY SYSTEMS MISC 5 L by Miscellaneous route. Currently using 3  L/min via [place]      pantoprazole (PROTONIX) 20 MG tablet TAKE 1 TABLET(20 MG) BY MOUTH DAILY 90 tablet 3    rosuvastatin (CRESTOR) 5 MG tablet TAKE 1 TABLET EVERY OTHER DAY 24 tablet 1    spironolactone (ALDACTONE) 25 MG tablet Take 0.5 tablets (12.5 mg total) by mouth daily. 30 tablet 2    tafamidis (VYNDAMAX) 61 mg cap Take 1 capsule (61 mg) by mouth daily. 30 capsule 11    tiotropium bromide (SPIRIVA RESPIMAT) 2.5 mcg/actuation inhalation mist Inhale 2 puffs daily. 4 g 11    torsemide (DEMADEX) 20 MG tablet Take 3 tablets (60 mg total) by mouth daily. May also take 1 tablet (20 mg total) daily as needed (weight gain). 120 tablet 11    traZODone (DESYREL) 50 MG tablet Take 1 tablet (50 mg total) by mouth nightly as needed for sleep. 30 tablet 0    vitamin A-3,000 mcg RAE, 10,000 UNIT, 3,000 mcg RAE (10,000 UNIT) capsule Take 1 capsule (3,000 mcg of RAE total) by mouth daily.      vutrisiran (AMVUTTRA) 25 mg/0.5 mL injection Inject 0.5 mL (25 mg total) under the skin once. Every 12 Weeks (Patient not taking: Reported on 11/11/2023)       No current facility-administered medications for this visit.       PHYSICAL EXAM:  Vitals:    11/11/23 1601   BP: 131/75   Pulse: 104   Temp: 36.6 ??C (97.8 ??F)     BP Readings from Last 3 Encounters:   11/11/23 131/75   11/11/23 125/63   11/11/23 120/77     Wt Readings from Last 6 Encounters:   11/11/23 90.3 kg (199 lb)   11/11/23 90.3 kg (199 lb)   11/11/23 90.3 kg (199 lb)   11/02/23 91.5 kg (201 lb 11.5 oz)   10/27/23 92.5 kg (204 lb)   10/15/23 88.9 kg (196 lb)           CONSTITUTIONAL: Alert,well appearing, no distress, seated in wheelchair, wearing oxygen  HEENT: Moist mucous membranes, oropharynx clear without erythema or exudate  EYES:  Pupils reactive, sclerae anicteric.  NECK: Supple  CARDIOVASCULAR: Regular, normal S1/S2 heart sounds, no murmurs, no rubs.   PULM: Clear to auscultation bilaterally  GASTROINTESTINAL: Soft, active bowel sounds, nontender  EXTREMITIES: No lower extremity edema bilaterally.   SKIN: No rashes or lesions  NEUROLOGIC: No focal motor or sensory deficits; no asterixis    MEDICAL DECISION MAKING  Urine dipstick: Not performed  Urine microscopy: Not performed      Component      Latest Ref Rng 11/01/2023 11/02/2023 11/11/2023   Sodium      135 - 145 mmol/L 134 (L)  135  137    Potassium      3.4 - 4.8 mmol/L 3.4  3.3 (L)  4.1    Chloride      98 - 107 mmol/L 99  98  96 (L)    Anion Gap  5 - 14 mmol/L 5  4 (L)  9    CO2      20.0 - 31.0 mmol/L 30.0  33.0 (H)  32.1 (H)    Bun      9 - 23 mg/dL 40 (H)  46 (H)  60 (H)    Creatinine      0.55 - 1.02 mg/dL 1.61 (H)  0.96 (H)  0.45 (H)    BUN/Creatinine Ratio 19  20  29     Glucose      70 - 179 mg/dL 409  85  811 (H)    Calcium      8.7 - 10.4 mg/dL 8.8  9.1  9.7    eGFR [name]-EPI (2021) Female      >=60 mL/min/1.51m2 24 (L)  21 (L)  24 (L)

## 2023-11-16 MED ORDER — CYCLOBENZAPRINE 5 MG TABLET
ORAL_TABLET | Freq: Two times a day (BID) | ORAL | 0 refills | 30 days | PRN
Start: 2023-11-16 — End: ?

## 2023-11-17 ENCOUNTER — Encounter: Admit: 2023-11-17 | Discharge: 2023-11-17 | Payer: MEDICARE

## 2023-11-17 ENCOUNTER — Ambulatory Visit: Admit: 2023-11-17 | Discharge: 2023-11-17 | Payer: MEDICARE

## 2023-11-17 MED ORDER — CYCLOBENZAPRINE 5 MG TABLET
ORAL_TABLET | Freq: Two times a day (BID) | ORAL | 0 refills | 30 days | Status: SS | PRN
Start: 2023-11-17 — End: ?

## 2023-11-17 MED ADMIN — sodium chloride (NS) 0.9 % flush: INTRAVENOUS | @ 16:00:00 | Stop: 2023-11-17

## 2023-11-17 MED ADMIN — Propofol (DIPRIVAN) injection: INTRAVENOUS | @ 16:00:00 | Stop: 2023-11-17

## 2023-11-17 MED ADMIN — lidocaine (PF) (XYLOCAINE-MPF) 20 mg/mL (2 %) injection: INTRAVENOUS | @ 16:00:00 | Stop: 2023-11-17

## 2023-11-18 ENCOUNTER — Telehealth: Admit: 2023-11-18 | Discharge: 2023-11-19 | Payer: MEDICARE

## 2023-11-18 DIAGNOSIS — I272 Pulmonary hypertension, unspecified: Principal | ICD-10-CM

## 2023-11-18 DIAGNOSIS — E854 Organ-limited amyloidosis: Principal | ICD-10-CM

## 2023-11-18 DIAGNOSIS — I43 Cardiomyopathy in diseases classified elsewhere: Principal | ICD-10-CM

## 2023-11-18 DIAGNOSIS — I4819 Other persistent atrial fibrillation: Principal | ICD-10-CM

## 2023-11-18 DIAGNOSIS — D696 Thrombocytopenia, unspecified: Principal | ICD-10-CM

## 2023-11-18 DIAGNOSIS — I10 Essential (primary) hypertension: Principal | ICD-10-CM

## 2023-11-18 DIAGNOSIS — N184 Chronic kidney disease, stage 4 (severe): Principal | ICD-10-CM

## 2023-11-18 DIAGNOSIS — E1122 Type 2 diabetes mellitus with diabetic chronic kidney disease: Principal | ICD-10-CM

## 2023-11-18 DIAGNOSIS — Z794 Long term (current) use of insulin: Principal | ICD-10-CM

## 2023-11-18 DIAGNOSIS — I5032 Chronic diastolic (congestive) heart failure: Principal | ICD-10-CM

## 2023-11-18 DIAGNOSIS — Z09 Encounter for follow-up examination after completed treatment for conditions other than malignant neoplasm: Principal | ICD-10-CM

## 2023-11-18 MED ORDER — TORSEMIDE 20 MG TABLET
ORAL_TABLET | ORAL | 11 refills | 0 days | Status: CP
Start: 2023-11-18 — End: 2024-11-17

## 2023-11-18 MED FILL — VYNDAMAX 61 MG CAPSULE: ORAL | 30 days supply | Qty: 30 | Fill #11

## 2023-11-18 NOTE — Unmapped (Addendum)
Internal Medicine Clinic Visit  Person Contacted: Patient  Contact Phone number: [phone] (home)   Is there someone else in the room? Yes. What is your relationship? Daughter [name]. Do you want this person here for the visit? yes.  Reason for visit: follow up    A/P:    Essential hypertension  Overview:  History of hypertension, now stable.   On spirinolactone 25 mg every day.Torsemide-60 mg qd      BP Readings from Last 3 Encounters:   11/17/23 118/66   11/11/23 131/75   11/11/23 125/63     Plan: cont meds     Plan: continue treatment-  Lab Results   Component Value Date    CREATININE 2.06 (H) 11/11/2023     Lab Results   Component Value Date     Pulmonary hypertension (CMS-HCC)  Overview:  Mild pulm htn on Echo 01/2023  Likely 2/2 left heart failure, and COPD  Plan: optimize her chronic heart failure and COPD, oxygen therapy      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)  Overview:  Secondary to TTR amyloid. Followed by Dr Emi Holes  Diuresis can be complicated by worsening of her [name], but currently stable     On amiodarone 200 mg every day, torsemide 40 mg q am and 20 mg q pm, tafamidis for TTR amyloid, spirinolactone 25 mg  Has iron infusions as needed  Euvolemic by report  Plan:   Cont current meds  Follows up with [facility] cardiology     Orders:  -     torsemide; Take 3 tablets (60 mg total) by mouth daily. May also take 1 tablet (20 mg total) daily as needed (weight gain).    Cardiac amyloidosis (CMS-HCC)  Overview:  Diagnosed 10/2020 on NM Spect. Followed at [facility] cardiology  On tafamidis and amvuttra    Type 2 diabetes mellitus with stage 4 chronic kidney disease, with long-term current use of insulin (CMS-HCC)  Overview:  Lab Results   Component Value Date    A1C 5.8 (H) 08/30/2023    A1C 5.8 (H) 04/14/2023    A1C 5.7 (H) 10/29/2022     Diabetes, off meds, blood sugars have been in range  Plan: check A1C in 3 months      Chronic kidney disease ([name]), stage IV (severe) (CMS-HCC)  Overview:  Lab Results   Component Value Date    CREATININE 2.06 (H) 11/11/2023     Creatinine stable60 mg every day Also on spirinolactone.     Follows with [facility] nephrology        [facility] discharge follow-up  -     Ambulatory Referral to Internal Medicine    Persistent atrial fibrillation (CMS-HCC)  Overview:  On amiodarone 200 mg every day,    Cardioversion 10/2023    On  apixaban, amiodarone  Plan: follow up with cardiology      Thrombocytopenia (CMS-HCC)  Overview:  Lab Results   Component Value Date    PLT 197 11/11/2023    PLT 148 (L) 11/02/2023    PLT 154 11/01/2023    PLT 158 10/27/2023    PLT 126 (L) 10/01/2023     Stable. Etiology unclear-likely has hepatic congestion from heart failure, pulm htn Plan: CTM     Code Status: pt would like to be full code  Difficulty Walking  Due to multiple complex medical conditions, pt has difficulty walking and would benefit from home physical therapy. She is homebound.   Return in about 3  months (around 02/18/2024).    HPI:   Pt is a 82 y.o. female with a history of  cardiac amyloid. [name], COPD, DM, [name]   Had cardioversion. Says it went great. Thinks she has more energy.   Breathing-on 3 liters.   Eating ok, but no appetite.   Same meds: torsemide, 60 mg.   Blood sugars 97, 100  Sleeping-ok  Getting home health-today was last today.   Was getting PT, but does not feel that she needs it.   Pain: no pain.   Has not gotten influenza or covid.   Urinating-okay.   If I have to go to hospital, I will go.   BP 116/71, Hr 67,     Lab Results   Component Value Date    TSH 2.346 09/23/2023         Saw renal: no changes    Problem List:  Patient Active Problem List   Diagnosis    Spinal stenosis of lumbar region    Thoracic or lumbosacral neuritis or radiculitis    Lumbosacral spondylosis    COPD (chronic obstructive pulmonary disease) (CMS-HCC)    Sleep apnea in adult    Essential hypertension    Type 2 diabetes mellitus with stage 4 chronic kidney disease, with long-term current use of insulin (CMS-HCC)    Chronic cystitis    Incomplete bladder emptying    Peripheral neuropathy    Chronic respiratory failure with hypoxia (CMS-HCC)    Persistent atrial fibrillation (CMS-HCC)    Anemia in chronic kidney disease    Chronic kidney disease ([name]), stage IV (severe) (CMS-HCC)    Pulmonary hypertension (CMS-HCC)    Chronic prescription opiate use    Dyspnea on exertion    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)    Cardiac amyloidosis (CMS-HCC)    Recurrent cold sores    Dyspepsia    Iron deficiency anemia due to chronic blood loss    Neuropathy    Left hip pain    Familial amyloid heart disease (CMS-HCC)    Hereditary amyloidosis (CMS-HCC)    Osteoporosis with current pathological fracture    Troponin level elevated    Thrombocytopenia (CMS-HCC)       Medications:  Reviewed in EPIC      Physical Exam:   Vital Signs:  BP Readings from Last 3 Encounters:   11/17/23 118/66   11/11/23 131/75   11/11/23 125/63      Wt Readings from Last 3 Encounters:   11/17/23 88.5 kg (195 lb)   11/18/23 88.5 kg (195 lb)   11/11/23 90.3 kg (199 lb)      General appearance -   Wearing oxygen  No dyspnea    Records review  Lab Results   Component Value Date    A1C 5.8 (H) 08/30/2023        Health Maintenance Due   Topic Date Due    COVID-19 Vaccine (5 - 2024-25 season) 08/29/2023    Influenza Vaccine (1) 08/29/2023      The ASCVD Risk score (Arnett DK, et al., 2019) failed to calculate.     Medication adherence and barriers to the treatment plan have been addressed. Opportunities to optimize healthy behaviors have been discussed. Patient / caregiver voiced understanding.      The patient reports they are physically located in [place] and is currently: at home. I conducted a audio/video visit. I spent  52m 27s on the video call with the patient. I spent  an additional 20 minutes on pre- and post-visit activities on the date of service .

## 2023-11-18 NOTE — Unmapped (Signed)
[facility] Internal Medicine at [facility]     Are you located in [place]? yes    Reason for visit: Follow up    Questions / Concerns that need to be addressed: no    PTHomeBP     HCDM reviewed and updated in Epic:    We are working to make sure all of our patients??? wishes are updated in Epic and part of that is documenting a Environmental health practitioner for each patient  A Health Care Decision Maker is someone you choose who can make health care decisions for you if you are not able - who would you most want to do this for you????  is already up to date.    HCDM (With Legal Document To Support): [name],[name] - Daughter - [phone]

## 2023-11-29 MED FILL — ELIQUIS 2.5 MG TABLET: ORAL | 30 days supply | Qty: 60 | Fill #1

## 2023-11-29 MED FILL — MIDODRINE 5 MG TABLET: ORAL | 30 days supply | Qty: 90 | Fill #1

## 2023-11-30 MED ORDER — TRAZODONE 50 MG TABLET
ORAL_TABLET | Freq: Every evening | ORAL | 3 refills | 30 days | Status: CP | PRN
Start: 2023-11-30 — End: 2024-11-29

## 2023-12-10 ENCOUNTER — Ambulatory Visit: Admit: 2023-12-10 | Discharge: 2023-12-11 | Payer: MEDICARE

## 2023-12-10 DIAGNOSIS — I5032 Chronic diastolic (congestive) heart failure: Principal | ICD-10-CM

## 2023-12-10 LAB — COMPREHENSIVE METABOLIC PANEL
ALBUMIN: 2.9 g/dL — ABNORMAL LOW (ref 3.4–5.0)
ALKALINE PHOSPHATASE: 274 U/L — ABNORMAL HIGH (ref 46–116)
ALT (SGPT): 9 U/L — ABNORMAL LOW (ref 10–49)
ANION GAP: 13 mmol/L (ref 5–14)
AST (SGOT): 30 U/L (ref <=34)
BILIRUBIN TOTAL: 0.9 mg/dL (ref 0.3–1.2)
BLOOD UREA NITROGEN: 66 mg/dL — ABNORMAL HIGH (ref 9–23)
BUN / CREAT RATIO: 31
CALCIUM: 9.4 mg/dL (ref 8.7–10.4)
CHLORIDE: 93 mmol/L — ABNORMAL LOW (ref 98–107)
CO2: 29.8 mmol/L (ref 20.0–31.0)
CREATININE: 2.16 mg/dL — ABNORMAL HIGH (ref 0.55–1.02)
EGFR CKD-EPI (2021) FEMALE: 22 mL/min/{1.73_m2} — ABNORMAL LOW (ref >=60)
GLUCOSE RANDOM: 182 mg/dL — ABNORMAL HIGH (ref 70–179)
POTASSIUM: 3.4 mmol/L (ref 3.4–4.8)
PROTEIN TOTAL: 7.3 g/dL (ref 5.7–8.2)
SODIUM: 136 mmol/L (ref 135–145)

## 2023-12-10 LAB — CBC
HEMATOCRIT: 34.6 % (ref 34.0–44.0)
HEMOGLOBIN: 11.3 g/dL (ref 11.3–14.9)
MEAN CORPUSCULAR HEMOGLOBIN CONC: 32.6 g/dL (ref 32.0–36.0)
MEAN CORPUSCULAR HEMOGLOBIN: 29.1 pg (ref 25.9–32.4)
MEAN CORPUSCULAR VOLUME: 89.4 fL (ref 77.6–95.7)
MEAN PLATELET VOLUME: 8.3 fL (ref 6.8–10.7)
PLATELET COUNT: 219 10*9/L (ref 150–450)
RED BLOOD CELL COUNT: 3.87 10*12/L — ABNORMAL LOW (ref 3.95–5.13)
RED CELL DISTRIBUTION WIDTH: 14.4 % (ref 12.2–15.2)
WBC ADJUSTED: 11.2 10*9/L (ref 3.6–11.2)

## 2023-12-10 LAB — MAGNESIUM: MAGNESIUM: 2 mg/dL (ref 1.6–2.6)

## 2023-12-10 LAB — PRO-BNP: PRO-BNP: 16566 pg/mL — ABNORMAL HIGH (ref <=300.0)

## 2023-12-10 MED ORDER — POTASSIUM CHLORIDE ER 20 MEQ TABLET,EXTENDED RELEASE(PART/CRYST)
ORAL_TABLET | Freq: Every day | ORAL | 0 refills | 14.00 days | Status: CP
Start: 2023-12-10 — End: 2023-12-24

## 2023-12-11 DIAGNOSIS — N390 Urinary tract infection, site not specified: Principal | ICD-10-CM

## 2023-12-11 MED ORDER — FOSFOMYCIN TROMETHAMINE 3 GRAM ORAL PACKET
PACK | ORAL | 0 refills | 0.00 days
Start: 2023-12-11 — End: ?

## 2023-12-13 DIAGNOSIS — I43 Cardiomyopathy in diseases classified elsewhere: Principal | ICD-10-CM

## 2023-12-13 DIAGNOSIS — E854 Organ-limited amyloidosis: Principal | ICD-10-CM

## 2023-12-13 MED ORDER — VYNDAMAX 61 MG CAPSULE
ORAL_CAPSULE | Freq: Every day | ORAL | 11 refills | 30.00 days | Status: CP
Start: 2023-12-13 — End: ?
  Filled 2023-12-20: qty 30, 30d supply, fill #0

## 2023-12-13 MED ORDER — FOSFOMYCIN TROMETHAMINE 3 GRAM ORAL PACKET
PACK | ORAL | 2 refills | 28.00 days | Status: CP
Start: 2023-12-13 — End: ?

## 2023-12-13 NOTE — Unmapped (Signed)
[facility] Refill Coordination Note    Specialty Medication(s) to be Shipped:   Cardiology: Vyndamax    Other medication(s) to be shipped: No additional medications requested for fill at this time     Barbara Huber, DOB: 05/30/1941  Phone: [phone] (home)       All above HIPAA information was verified with patient.     Was a Nurse, learning disability used for this call? No    Completed refill call assessment today to schedule patient's medication shipment from the [facility]  [phone]).  All relevant notes have been reviewed.     Specialty medication(s) and dose(s) confirmed: Regimen is correct and unchanged.   Changes to medications: Viera reports no changes at this time.  Changes to insurance: No  New side effects reported not previously addressed with a pharmacist or physician: None reported  Questions for the pharmacist: No    Confirmed patient received a Conservation officer, historic buildings and a Surveyor, mining with first shipment. The patient will receive a drug information handout for each medication shipped and additional FDA Medication Guides as required.       DISEASE/MEDICATION-SPECIFIC INFORMATION        N/A    SPECIALTY MEDICATION ADHERENCE     Medication Adherence    Patient reported X missed doses in the last month: 0  Specialty Medication: Vyndamax 61mg   Patient is on additional specialty medications: No  Patient is on more than two specialty medications: No  Any gaps in refill history greater than 2 weeks in the last 3 months: no  Demonstrates understanding of importance of adherence: yes  Informant: patient              Were doses missed due to medication being on hold? No     VYNDAMAX 61  mg: 8 days of medicine on hand       REFERRAL TO PHARMACIST     Referral to the pharmacist: Not needed      [facility]     Shipping address confirmed in Epic.       Delivery Scheduled: Yes, Expected medication delivery date: 12/20/23.  However, Rx request for refills was sent to the provider as there are none remaining.     Medication will be delivered via Same Day Courier to the prescription address in Epic WAM.    Moshe Salisbury   [facility]  Specialty Technician

## 2023-12-15 ENCOUNTER — Ambulatory Visit: Admit: 2023-12-15 | Discharge: 2023-12-16 | Payer: MEDICARE | Attending: Adult Health | Primary: Adult Health

## 2023-12-15 DIAGNOSIS — N184 Chronic kidney disease, stage 4 (severe): Principal | ICD-10-CM

## 2023-12-15 DIAGNOSIS — E852 Heredofamilial amyloidosis, unspecified: Principal | ICD-10-CM

## 2023-12-15 DIAGNOSIS — I4819 Other persistent atrial fibrillation: Principal | ICD-10-CM

## 2023-12-15 DIAGNOSIS — I43 Cardiomyopathy in diseases classified elsewhere: Principal | ICD-10-CM

## 2023-12-15 DIAGNOSIS — I5032 Chronic diastolic (congestive) heart failure: Principal | ICD-10-CM

## 2023-12-15 DIAGNOSIS — E854 Organ-limited amyloidosis: Principal | ICD-10-CM

## 2023-12-15 LAB — BASIC METABOLIC PANEL
ANION GAP: 14 mmol/L (ref 5–14)
BLOOD UREA NITROGEN: 64 mg/dL — ABNORMAL HIGH (ref 9–23)
BUN / CREAT RATIO: 31
CALCIUM: 9.7 mg/dL (ref 8.7–10.4)
CHLORIDE: 94 mmol/L — ABNORMAL LOW (ref 98–107)
CO2: 28.1 mmol/L (ref 20.0–31.0)
CREATININE: 2.09 mg/dL — ABNORMAL HIGH (ref 0.55–1.02)
EGFR CKD-EPI (2021) FEMALE: 23 mL/min/{1.73_m2} — ABNORMAL LOW (ref >=60)
GLUCOSE RANDOM: 133 mg/dL (ref 70–179)
POTASSIUM: 3.9 mmol/L (ref 3.4–4.8)
SODIUM: 136 mmol/L (ref 135–145)

## 2023-12-15 LAB — PRO-BNP: PRO-BNP: 15177 pg/mL — ABNORMAL HIGH (ref <=300.0)

## 2023-12-15 LAB — MAGNESIUM: MAGNESIUM: 1.9 mg/dL (ref 1.6–2.6)

## 2023-12-15 MED ORDER — APIXABAN 2.5 MG TABLET
ORAL_TABLET | Freq: Two times a day (BID) | ORAL | 3 refills | 90.00 days | Status: CP
Start: 2023-12-15 — End: 2024-12-14

## 2023-12-15 MED ORDER — MIDODRINE 5 MG TABLET
ORAL_TABLET | Freq: Three times a day (TID) | ORAL | 11 refills | 30.00 days | Status: CP
Start: 2023-12-15 — End: 2024-12-14

## 2023-12-15 NOTE — Unmapped (Signed)
[facility] HF Amyloid Clinic Note    Referring Provider: Montez Huber, AGNP  [address]  [address]  [place],  [place] [address]   Primary Provider: Artelia Laroche, MD  [address]  [place] [address]   Other Providers:  Dr Barbara Huber, Dr Barbara Huber    Reason for Visit:  Barbara Huber is a 82 y.o. female being seen for routine visit and continued care of cardiac amyloidosis .    Assessment & Plan:  1. 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 SPEP/UPEP/free light chains without concern. 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 Daughter is informed and has seen cardiology Barbara Huber).   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 2nd injection July 2024, 3rd in November 2024. Continue vitamin A supplementation.   - DC'd Jardiance 10mg  daily due to recurrent UTIs  - Weight at home has been 186-189. Has lost some body weight due to poor appetite. With recently eleveated pro-BNP, weight loss may be masking some fluid retention. Increase torsemide to 80mg  daily with labs in ~10 days.   - metolazone 5mg  PRN if weight is not trending down with increased torsemide  - Taking midodrine 5mg  TID with BP in 90-110s/60-70s at home. Tried stopping PM dose for concern it was contributing to insomnia, but no improvement in sleep. Sleep improved on its own. Continue to monitor BP and can increase to 10mg  for SBP <90   - Continue Tafamidis 61mg  daily. Consider switch to acoramidis for worsening symptoms.   - spironolactone was stopped during admission for AKI, but she has resumed 12.5mg  daily.   - She is not interested in CardioMEMS at this time.   1b. Goals of Care  - Had a discussion at prior visit w/ pt and daughter Barbara Huber regarding pt's goals of care.   We discussed role of palliative care (has seen Dr Barbara Huber) and hospice (when appropriate). She may wish to return to hospital if needed, but likely not ICU level care or invasive monitoring (such as swan)    2. Persistent atrial fibrillation  - CHA2DS2-VASc score = 5 (1-gender, 2-Age, 1-DM, 1-HTN)  - We had previously stopped Eliquis given recurrent anemia, shared decision making w/ pt and daughter. She was not interested in [place] at the time.   - Eliquis 2.5mg  BID restarted during hospitalization - will closely follow hemoglobin in follow up. Hemoglobin has been stable, 11.3 on 12/13.   - On amiodarone 200mg  daily - in SR by EKG today, s/p DCCV on 11/17/23  - For recurrent AF, she wouldn't want invasive procedures (like PPM and AVN ablation)  - amiodarone monitoring - last PFTs in 04/2021. TSH (09/23/23) /LFTs (08/31/23) stable at last check. Repeat labs due in March.     3. [name]  - Cr baseline around 2.3-3.0  - Peaked at 5.4 during admission, 2.93 on discharge  - today Cr 2.09, K 3.9, pro-BNP 15,177    4. COPD, OSA  - home O2 3-4L Barbara Huber - followed by pulm  - on Advair and Spiriva  - Uses trilogy at night  5. Iron deficiency anemia  - As above, follow hemoglobin now that she is back on Eliquis   Lab Results   Component Value Date    FERRITIN 336.3 (H) 08/19/2023     6. Anxiety  - previously on Zoloft  - Tried Remeron, but discontinued in setting of worsening HF symptoms    7. DM  - Off Insulin with A1c <7  - Followed by PCP   Lab Results   Component Value Date    A1C 5.8 (H) 08/30/2023     8. Acute on chronic back pain  - follows with pain clinic, on oxycodone  - gabapentin BID PRN   - Continue flexeril 5mg  PRN nightly, which she tolerated previously during last flare up of her back pain (feb-May 2024)    Follow-up:  Return in about 1 month (around 01/15/2024) for Return HF.     History of Present Illness:  Barbara Huber is a 82 y.o. female with PMHx of COPD on 3L home [place], CHF, [name], [name], [name] She was initially admitted with decompensated [name] in 2021 in setting of  Septic Shock d/t Gangrenous Cholecystitis s/p cholecystectomy. She underwent further evaluation with PYP scan which showed TTR amyloid. She has since been followed in clinic for management of her [name]. In 2022, had numerous admissions for weakness/urosepsis, shortness of breath/orthopnea/weight gain, lightheadedness/SOB/Blurry vision found to be anemic.    Summary of most recent visits:  - 01/16/22 [facility]): Feeling well from a cardiac standpoint. Started Zoloft for anxiety  - 02/13/22 [facility]): Weight 210-211. Feeling well. Taking extra torsemide 20mg  about every other day, which was stopped for elevated Cr.   - 03/13/22 ([name]): Weight 201-216, took metolazone PRN, weight stabilized at 210lbs. Considering cardioMEMS.   - 05/13/22 ([name]): Had 3 iron infusions. Doing well w/ volume, weight 211-212lbs. Cooking, staying active around house.   - 05/21/22 ([name]): CP/SOB. Weight 217 from 213. She took metolazone x1 dose.   - 05/22/22 ([name]): Ed follow up. Back down to 213lbs. Still having CP w/ certain movement.   - 06/05/22 ([name]): Weight 214-216lb, should be 212lb.   - 06/09/22, 06/11/22, 06/22/22 ([name]): Weight down from 217 to 213lb, 211lb at home. Feeling well by 6/26.   - 07/15/22 ([name]): Switched inhaler back to Advair/Spiriva with improvement in breathing and O2 sat. Weight 207lbs, feeling more like herself.   - 08/19/22 ([name]): Feeling well. Weight 208-210lbs.   - 10/22/22 ([name]): Feeling well. Weight has been 210-214lbs.   - 12/04/22 ([name]): doing well overall, weight a bit up so taking extra torsemide.   - 02/10/23 ([name]): Weight 213-214lbs, feeling well, eating a lot. Had taken metolazone earlier in the week.   - 02/23/23 [facility]): Hospital follow up after mechanical fall with nondisplaced fractures of the left superior and inferior pubic rami. Taking torsemide 40mg , weight 211.   - 04/14/23 ([name]): Weight 210-212. 1st Amvuttra  - 07/14/23 [facility]): Weight 211-212 on torsemide 60mg  daily. 2nd Amvuttra.   - 08/18/23-08/23/23 (Admission): fatigue, weakness, DOE, recent prednisone course for gout. Admitted to [facility], poor response to IV Lasix and was changed to IV Bumex 3mg  BID and metolazone. [name] held for low BP. Cr increased to 4.0 with diuresis. She went back to the ER on 8/24 for abdominal pain. She was felt to be euvolemic at a weight of 207lb and IV diuretics were held. She was discharged on torsemide 60mg  daily on 8/26.   - 08/27/23 [facility]): hospital  follow up. Felt to be dry for weight down to 204 at home, Cr 3.4 BUN 96. Changed torsemide from 60 daily to 40/20.   - 08/30/23-09/02/23 (Admission): Admitted for fatigue and SOB. Diuresed  with improvement in dyspnea. Weight on discharge was 204.   - 09/06/23 [facility]): Hospital follow up. Her weight continued to decrease on her scale by about 1lb per day. Weight 201 that morning. She was a little lightheaded and felt weak. Continued on torsemide 60mg  daily.  Cr 2.61, pro-BNP 4575  - 09/08/23 ([name]/Phone): Weight 201, breathing well.   - 09/16/23 (Labs/[facility]): Weight 205, not peeing much with torsemide 60mg . Feeling full in belly. Cr 3.11,  pro-BNP 14492. Metolazone and extra 40 torsemide.   - 09/17/23 ([facility]): Weight 203, feeling better. Continue torsemide 60/40 through weekend.   - 09/23/23 ([name]): n 9/25 reporting hat she didn't pee much the night before and weight was 203lb that morning. Increased by 2 lbs overnight. Instructed to take metolazone 2.5mg  and extra 40 torsemide in the afternoon. Today her weight was up and so she requested a visit. She didn't urinate much and this morning was 205lbs. Extra torsemide 60mg  then metolazone in morning and return for diuresis clinic.   - 09/24/23 (Diuresis): IV Lasix. Arranged for direct admission.   - 9/27-10/5 (Admission): She initially underwent inotrope assisted diuresis with dopamine and furosemide gtt, which was stopped on 9/30 after episodes of hypotension requiring NE and midodrine. Norepinephrine stopped and pulmonary wedge pressure was 12 prior to pulling swan on on 10/1. Midodrine maintained to maximize preload. Furosemide gtt transitioned to spot IV lasix and then was transitioned to home torsemide 60mg  daily on 10/2. Palliative care was consulted and family decided on outpatient evaluation. She was discharged with a pro-BNP of 17K down from 26K and weight of 209lb. Discharged with new home regimen torsemide 60mg  daily, metolazone prn per clinic, midodrine 5mg  TID, Tafamidis 61mg  daily. She was continued on chronic home amiodarone 200 mg daily for atrial fibrillation and restarted on Eliquis 2.5mg  BID.   - 10/9/242 [facility]): hospital follow up. Weight 193-195. Torsemide 60mg g daily. In AF RVR by EKG. Amio increased to 200mg  BID.   - 10/27/23 ([name]): Weight up to 201 but had been 196-197.  - 11/11/23 [facility]): Hospital follow up, [name] for hypoxia 2/2 malfunction of portable O2. Weight 194 on torsemide 60mg .  - 11/17/23 (EP Procedure): Cardioversion    Interval history  Today she presents for follow up. She had labs last week due to concern for fatigue/low energy. Labs showed stable Hgb and Cr and [place] pro-BNP to 165[address]. Pt and daughter did not think she was in afib as her HR was not elevated. Weight was 189.5 on torsemide 60mg  a day. She was instructed to take extra torsemide 20mg , which she did take for 2 days. She didn't notice any change in breathing or energy with increased torsemide. Her weight has been 186.9 at the lowest, hasn't gotten below 186, and was 189 today. She has a little bit of ankle edema, not much. She doesn't feel SOB or bloated. Her appetite remains poor. She just doesn't feel like eating anything. Neuropathy in her hands is bothering her, feels like she has very poor grip strength. Cardiovascular History & Procedures:    Cath / PCI:  none    CV Surgery:   none    EP Procedures and Devices:  11/17/23 - DCCV  01/10/21 - DCCV    Non-Invasive Evaluation(s):    Echo:  08/19/23  1. The left ventricle is normal in size with moderately increased wall  thickness.    2. 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    3. The left ventricular systolic function is hyperdynamic, LVEF is visually  estimated at >70%.    4. Mitral annular calcification is present (moderate).    5. The mitral valve leaflets are mildly thickened with normal leaflet  mobility.    6. The aortic valve is trileaflet with mildly thickened leaflets with normal  excursion.    7. The left atrium is moderately dilated in size.    8. The right ventricle is normal in size, with normal systolic function.    02/17/23    1. The left ventricle is normal in size with moderately increased wall  thickness.    2. The left ventricular systolic function is hyperdynamic, LVEF is visually  estimated at >70%.    3. The left atrium is moderately dilated in size.    4. The right ventricle is normal in size, with normal systolic function.    5. There is mild pulmonary hypertension.    12/09/21  Summary    1. The left ventricle is normal in size with severely increased wall  thickness.    2. The left ventricular systolic function is hyperdynamic, LVEF is visually  estimated at >70%.    3. There is grade III diastolic dysfunction (severely elevated filling  pressure).    4. The left atrium is moderately to severely dilated in size.    5. The right ventricle is mildly dilated in size, with normal systolic  function.    6. The right atrium is mildly dilated  in size.    7. IVC size and inspiratory change suggest elevated right atrial pressure.  (10-20 mmHg).    04/24/21   1. The left ventricle is normal in size with mildly increased wall  thickness.    2. The left ventricular systolic function is normal, LVEF is visually  estimated at > 55%.    3. There is grade II diastolic dysfunction (elevated filling pressure).    4. Mitral annular calcification is present (mild).    5. The left atrium is mildly dilated in size.    6. The right ventricle is mildly dilated in size, with low normal systolic  function.    7. IVC size and inspiratory change suggest elevated right atrial pressure.  (10-20 mmHg).    09/30/20  Summary    1. Limited study to assess systolic function.    2. IVC size and inspiratory change suggest normal right atrial pressure.  (0-5 mmHg).    3. The left ventricle is normal in size with mildly to moderately increased  wall thickness.    4. The left ventricular systolic function is normal with no obvious wall  motion abnormalities, LVEF is visually estimated at > 55%.    5. Mitral annular calcification is present.    6. The left atrium is mildly dilated in size.    7. The right ventricle is upper normal in size, with reduced systolic  Function.    Cardiac CT/MRI/Nuclear Tests:   None    6 Minute Walk:  None    Cardiopulmonary Stress Tests:   None               Other Past Medical History:  See below for the complete EPIC list of past medical and surgical history.      Allergies:  Nitrofurantoin and  Lipitor [atorvastatin]    Current Medications:  Current Outpatient Medications   Medication Sig Dispense Refill    ACCU-CHEK GUIDE TEST STRIPS Strp       acetaminophen (TYLENOL) 500 MG tablet Take 2 tablets (1,000 mg total) by mouth Three (3) times a day as needed for pain.      albuterol HFA 90 mcg/actuation inhaler Inhale 2 puffs every eight (8) hours as needed for wheezing.      amiodarone (PACERONE) 200 MG tablet Take 1 tablet (200 mg total) by mouth daily. 30 tablet 11    cranberry 500 mg cap Take 500 mg by mouth daily with evening meal.      cyclobenzaprine (FLEXERIL) 5 MG tablet Take 1 tablet (5 mg total) by mouth two (2) times a day as needed for muscle spasms. 60 tablet 0    ergocalciferol-1,250 mcg, 50,000 unit, (DRISDOL) 1,250 mcg (50,000 unit) capsule Take 1 capsule (1,250 mcg total) by mouth once a week. 12 capsule 3    estradioL (ESTRACE) 0.01 % (0.1 mg/gram) vaginal cream Insert 2 g into the vagina Two (2) times a week. 42.5 g 3    ferrous sulfate 325 (65 FE) MG EC tablet Take 1 tablet (325 mg total) by mouth Every Monday, Wednesday, and Friday. 36 tablet 3    fluticasone propion-salmeterol (ADVAIR HFA) 115-21 mcg/actuation inhaler Inhale 2 puffs two (2) times a day. 12 g 11    fosfomycin (MONUROL) 3 gram Pack Take 3 g by mouth once a week. 4 packet 2    gabapentin (NEURONTIN) 100 MG capsule Take 1 capsule (100 mg total) by mouth two (2) times a day. 60 capsule 11    lidocaine (ASPERCREME) 4 % patch Place 1 patch on the skin daily. Remove after 12 hours. 30 patch 2    metOLazone (ZAROXOLYN) 5 MG tablet Take 1 tablet (5 mg total) by mouth daily as needed (up to 3x weekly when instructed by cardiology clinic).      montelukast (SINGULAIR) 10 mg tablet Take 1 tablet (10 mg total) by mouth every morning. 90 tablet 2    oxyCODONE (ROXICODONE) 5 MG immediate release tablet Take 1 tablet (5 mg total) by mouth every eight (8) hours as needed for pain (moderate to severe pain). (Patient taking differently: Take 1 tablet (5 mg total) by mouth two (2) times a day.) 15 tablet 0    OXYGEN-AIR DELIVERY SYSTEMS MISC 5 L by Miscellaneous route. Currently using 3  L/min via [place]      pantoprazole (PROTONIX) 20 MG tablet TAKE 1 TABLET(20 MG) BY MOUTH DAILY 90 tablet 3    potassium chloride 20 MEQ ER tablet Take 1 tablet (20 mEq total) by mouth daily for 14 days. 14 tablet 0    rosuvastatin (CRESTOR) 5 MG tablet TAKE 1 TABLET EVERY OTHER DAY 24 tablet 1    spironolactone (ALDACTONE) 25 MG tablet Take 0.5 tablets (12.5 mg total) by mouth daily. 30 tablet 2    tafamidis (VYNDAMAX) 61 mg cap Take 1 capsule (61 mg) by mouth daily. 30 capsule 11    tiotropium bromide (SPIRIVA RESPIMAT) 2.5 mcg/actuation inhalation mist Inhale 2 puffs daily. 4 g 11    torsemide (DEMADEX) 20 MG tablet Take 3 tablets (60 mg total) by mouth daily. May also take 1 tablet (20 mg total) daily as needed (weight gain). 120 tablet 11    traZODone (DESYREL) 50 MG tablet Take 1 tablet (50 mg total) by mouth nightly as needed for sleep. 30  tablet 3    vitamin A-3,000 mcg RAE, 10,000 UNIT, 3,000 mcg RAE (10,000 UNIT) capsule Take 1 capsule (3,000 mcg of RAE total) by mouth daily.      vutrisiran (AMVUTTRA) 25 mg/0.5 mL injection Inject 0.5 mL (25 mg total) under the skin once. Every 12 Weeks      ACCU-CHEK SOFTCLIX LANCETS lancets       apixaban (ELIQUIS) 2.5 mg Tab Take 1 tablet (2.5 mg total) by mouth two (2) times a day. 180 tablet 3    midodrine (PROAMATINE) 5 MG tablet Take 1 tablet (5 mg total) by mouth three (3) times a day. 90 tablet 11    NARCAN 4 mg/actuation nasal spray 1 spray into alternating nostrils once as needed (opioid overdose). PRN - Emergency use.       No current facility-administered medications for this visit.       Family History:  The patient's family history includes Cancer in her father; Hypertension in her mother; No Known Problems in her brother, brother, maternal aunt, maternal grandfather, maternal grandmother, maternal uncle, paternal aunt, paternal grandfather, paternal grandmother, paternal uncle, sister, sister, and another family member.    Social history:  She  reports that she quit smoking about 5 years ago. Her smoking use included cigarettes. She has never used smokeless tobacco. She reports that she does not drink alcohol and does not use drugs.    Review of Systems:  As per HPI.  Rest of the review of ten systems is negative or unremarkable except as stated above.    Physical Exam:  VITAL SIGNS:   Vitals:    12/15/23 1344   BP: 109/62   Pulse: 74   Resp: 18   SpO2: 91%         Wt Readings from Last 3 Encounters:   12/15/23 86.9 kg (191 lb 9.6 oz)   11/18/23 88.5 kg (195 lb)   11/17/23 88.5 kg (195 lb)      Today's Body mass index is 30.01 kg/m??.   Height: 170.2 cm (5' 7)  CONSTITUTIONAL: well-appearing in no acute distress  EYES: Conjunctivae and sclerae clear and anicteric.  ENT: Benign.   CARDIOVASCULAR: JVP  not visible up neck with HOB at 90degrees. Rate and rhythm are irregularly irregular.  There is no lifts or heaves.  Normal S1, S2. There is no murmur, gallops or rubs.  Radial and pedal pulses are 2+, bilaterally.   There is trace-1+ edema to ankles, bilaterally.   RESPIRATORY: Decreased breath sounds at bases.  There are no wheezes.  GASTROINTESTINAL: Soft, non-tender, with audible bowel sounds. Abdomen nondistended.  Liver is nonpalpable.  SKIN: No rashes, ecchymosis or petechiae.  Warm, well perfused.   MUSCULOSKELETAL:  no joint swelling   NEURO/PSYCH: Appropriate mood and affect. Alert and oriented to person, place, and time. No gross motor or sensory deficits evident.    Pertinent Laboratory Studies:   Office Visit on 12/15/2023   Component Date Value Ref Range Status    EKG Ventricular Rate 12/15/2023 76  BPM Preliminary    EKG Atrial Rate 12/15/2023 76  BPM Preliminary    EKG P-R Interval 12/15/2023 186  ms Preliminary    EKG QRS Duration 12/15/2023 110  ms Preliminary    EKG Q-T Interval 12/15/2023 440  ms Preliminary    EKG QTC Calculation 12/15/2023 495  ms Preliminary    EKG Calculated P Axis 12/15/2023 40  degrees Preliminary    EKG Calculated R Axis 12/15/2023 213  degrees Preliminary    EKG Calculated T Axis 12/15/2023 12  degrees Preliminary    QTC Fredericia 12/15/2023 475  ms Preliminary   Appointment on 12/10/2023   Component Date Value Ref Range Status    Sodium 12/10/2023 136  135 - 145 mmol/L Final    Potassium 12/10/2023 3.4  3.4 - 4.8 mmol/L Final    Chloride 12/10/2023 93 (L)  98 - 107 mmol/L Final    CO2 12/10/2023 29.8  20.0 - 31.0 mmol/L Final    Anion Gap 12/10/2023 13  5 - 14 mmol/L Final    BUN 12/10/2023 66 (H)  9 - 23 mg/dL Final    Creatinine 57/84/6962 2.16 (H)  0.55 - 1.02 mg/dL Final    BUN/Creatinine Ratio 12/10/2023 31   Final    eGFR [name]-EPI (2021) Female 12/10/2023 22 (L)  >=60 mL/min/1.47m2 Final    Glucose 12/10/2023 182 (H)  70 - 179 mg/dL Final    Calcium 95/28/4132 9.4  8.7 - 10.4 mg/dL Final    Albumin 44/12/270 2.9 (L)  3.4 - 5.0 g/dL Final    Total Protein 12/10/2023 7.3  5.7 - 8.2 g/dL Final    Total Bilirubin 12/10/2023 0.9  0.3 - 1.2 mg/dL Final    AST 53/66/4403 30  <=34 U/L Final    ALT 12/10/2023 9 (L)  10 - 49 U/L Final    Alkaline Phosphatase 12/10/2023 274 (H)  46 - 116 U/L Final    PRO-BNP 12/10/2023 16,566.0 (H)  <=300.0 pg/mL Final    Magnesium 12/10/2023 2.0  1.6 - 2.6 mg/dL Final    WBC 47/42/5956 11.2  3.6 - 11.2 10*9/L Final    RBC 12/10/2023 3.87 (L)  3.95 - 5.13 10*12/L Final    HGB 12/10/2023 11.3  11.3 - 14.9 g/dL Final    HCT 38/75/6433 34.6  34.0 - 44.0 % Final    MCV 12/10/2023 89.4  77.6 - 95.7 fL Final    MCH 12/10/2023 29.1  25.9 - 32.4 pg Final    MCHC 12/10/2023 32.6  32.0 - 36.0 g/dL Final    RDW 29/51/8841 14.4  12.2 - 15.2 % Final    MPV 12/10/2023 8.3  6.8 - 10.7 fL Final    Platelet 12/10/2023 219  150 - 450 10*9/L Final   Admission on 11/17/2023, Discharged on 11/17/2023   Component Date Value Ref Range Status    EKG Ventricular Rate 11/17/2023 105  BPM Final    EKG QRS Duration 11/17/2023 124  ms Final    EKG Q-T Interval 11/17/2023 420  ms Final    EKG QTC Calculation 11/17/2023 555  ms Final    EKG Calculated R Axis 11/17/2023 265  degrees Final    EKG Calculated T Axis 11/17/2023 52  degrees Final    QTC Fredericia 11/17/2023 506  ms Final    EKG Ventricular Rate 11/17/2023 67  BPM Final    EKG Atrial Rate 11/17/2023 67  BPM Final    EKG P-R Interval 11/17/2023 208  ms Final    EKG QRS Duration 11/17/2023 126  ms Final    EKG Q-T Interval 11/17/2023 510  ms Final    EKG QTC Calculation 11/17/2023 538  ms Final    EKG Calculated P Axis 11/17/2023 58  degrees Final    EKG Calculated R Axis 11/17/2023 260  degrees Final    EKG Calculated T Axis 11/17/2023 11 degrees Final    QTC Fredericia 11/17/2023 529  ms Final  Office Visit on 11/11/2023   Component Date Value Ref Range Status    Sodium 11/11/2023 137  135 - 145 mmol/L Final    Potassium 11/11/2023 4.1  3.4 - 4.8 mmol/L Final    Chloride 11/11/2023 96 (L)  98 - 107 mmol/L Final    CO2 11/11/2023 32.1 (H)  20.0 - 31.0 mmol/L Final    Anion Gap 11/11/2023 9  5 - 14 mmol/L Final    BUN 11/11/2023 60 (H)  9 - 23 mg/dL Final    Creatinine 12/30/7251 2.06 (H)  0.55 - 1.02 mg/dL Final    BUN/Creatinine Ratio 11/11/2023 29   Final    eGFR [name]-EPI (2021) Female 11/11/2023 24 (L)  >=60 mL/min/1.89m2 Final    Glucose 11/11/2023 199 (H)  70 - 179 mg/dL Final    Calcium 66/44/0347 9.7  8.7 - 10.4 mg/dL Final    Magnesium 42/59/5638 2.1  1.6 - 2.6 mg/dL Final    PRO-BNP 75/64/3329 9,362.0 (H)  <=300.0 pg/mL Final    WBC 11/11/2023 8.6  3.6 - 11.2 10*9/L Final    RBC 11/11/2023 3.89 (L)  3.95 - 5.13 10*12/L Final    HGB 11/11/2023 11.4  11.3 - 14.9 g/dL Final    HCT 51/88/4166 35.6  34.0 - 44.0 % Final    MCV 11/11/2023 91.5  77.6 - 95.7 fL Final    MCH 11/11/2023 29.3  25.9 - 32.4 pg Final    MCHC 11/11/2023 32.0  32.0 - 36.0 g/dL Final    RDW 06/26/1600 13.6  12.2 - 15.2 % Final    MPV 11/11/2023 9.1  6.8 - 10.7 fL Final    Platelet 11/11/2023 197  150 - 450 10*9/L Final   Admission on 11/01/2023, Discharged on 11/02/2023   Component Date Value Ref Range Status    EKG Ventricular Rate 11/01/2023 111  BPM Final    EKG QRS Duration 11/01/2023 130  ms Final    EKG Q-T Interval 11/01/2023 394  ms Final    EKG QTC Calculation 11/01/2023 535  ms Final    EKG Calculated R Axis 11/01/2023 217  degrees Final    EKG Calculated T Axis 11/01/2023 7  degrees Final    QTC Fredericia 11/01/2023 483  ms Final    PRO-BNP 11/01/2023 6,696.0 (H)  <=300.0 pg/mL Final    Sodium 11/01/2023 134 (L)  135 - 145 mmol/L Final    Potassium 11/01/2023 3.4  3.4 - 4.8 mmol/L Final    Chloride 11/01/2023 99  98 - 107 mmol/L Final    CO2 11/01/2023 30.0  20.0 - 31.0 mmol/L Final    Anion Gap 11/01/2023 5  5 - 14 mmol/L Final    BUN 11/01/2023 40 (H)  9 - 23 mg/dL Final    Creatinine 09/32/3557 2.07 (H)  0.55 - 1.02 mg/dL Final    BUN/Creatinine Ratio 11/01/2023 19   Final    eGFR [name]-EPI (2021) Female 11/01/2023 24 (L)  >=60 mL/min/1.58m2 Final    Glucose 11/01/2023 152  70 - 179 mg/dL Final    Calcium 32/20/2542 8.8  8.7 - 10.4 mg/dL Final    Albumin 70/62/3762 3.2 (L)  3.4 - 5.0 g/dL Final    Total Protein 11/01/2023 6.6  5.7 - 8.2 g/dL Final    Total Bilirubin 11/01/2023 0.7  0.3 - 1.2 mg/dL Final    AST 83/15/1761 24  <=34 U/L Final    ALT 11/01/2023 8 (L)  10 - 49 U/L Final  Alkaline Phosphatase 11/01/2023 164 (H)  46 - 116 U/L Final    hsTroponin I 11/01/2023 58 (HH)  <=34 ng/L Final    WBC 11/01/2023 7.2  3.6 - 11.2 10*9/L Final    RBC 11/01/2023 3.86 (L)  3.95 - 5.13 10*12/L Final    HGB 11/01/2023 11.5  11.3 - 14.9 g/dL Final    HCT 71/05/2693 35.5  34.0 - 44.0 % Final    MCV 11/01/2023 92.0  77.6 - 95.7 fL Final    MCH 11/01/2023 29.7  25.9 - 32.4 pg Final    MCHC 11/01/2023 32.3  32.0 - 36.0 g/dL Final    RDW 85/46/2703 14.1  12.2 - 15.2 % Final    MPV 11/01/2023 9.0  6.8 - 10.7 fL Final    Platelet 11/01/2023 154  150 - 450 10*9/L Final    Neutrophils % 11/01/2023 73.0  % Final    Lymphocytes % 11/01/2023 13.3  % Final    Monocytes % 11/01/2023 11.0  % Final    Eosinophils % 11/01/2023 1.7  % Final    Basophils % 11/01/2023 1.0  % Final    Absolute Neutrophils 11/01/2023 5.2  1.8 - 7.8 10*9/L Final    Absolute Lymphocytes 11/01/2023 1.0 (L)  1.1 - 3.6 10*9/L Final    Absolute Monocytes 11/01/2023 0.8  0.3 - 0.8 10*9/L Final    Absolute Eosinophils 11/01/2023 0.1  0.0 - 0.5 10*9/L Final    Absolute Basophils 11/01/2023 0.1  0.0 - 0.1 10*9/L Final    Specimen Source 11/01/2023 Venous   Final    FIO2 Venous 11/01/2023 Not Specified   Final    pH, Venous 11/01/2023 7.37  7.32 - 7.43 Final    pCO2, Ven 11/01/2023 57  40 - 60 mm Hg Final    pO2, Ven 11/01/2023 35 30 - 55 mm Hg Final    HCO3, Ven 11/01/2023 33 (H)  22 - 27 mmol/L Final    Base Excess, Ven 11/01/2023 5.6 (H)  -2.0 - 2.0 Final    O2 Saturation, Venous 11/01/2023 64.1  40.0 - 85.0 % Final    Lactate, Venous 11/01/2023 1.9 (H)  0.5 - 1.8 mmol/L Final    hsTroponin I 11/01/2023 105 (HH)  <=34 ng/L Final    delta hsTroponin I 11/01/2023 47 (HH)  <=7 ng/L Final    hsTroponin I 11/01/2023 199 (HH)  <=34 ng/L Final    delta hsTroponin I 11/01/2023 94 (HH)  <=7 ng/L Final    SARS-CoV-2 PCR 11/01/2023 Negative  Negative Final    Influenza A 11/01/2023 Negative  Negative Final    Influenza B 11/01/2023 Negative  Negative Final    RSV 11/01/2023 Negative  Negative Final    hsTroponin I 11/02/2023 218 (HH)  <=34 ng/L Final    hsTroponin I 11/02/2023 207 (HH)  <=34 ng/L Final    Sodium 11/02/2023 135  135 - 145 mmol/L Final    Potassium 11/02/2023 3.3 (L)  3.4 - 4.8 mmol/L Final    Chloride 11/02/2023 98  98 - 107 mmol/L Final    CO2 11/02/2023 33.0 (H)  20.0 - 31.0 mmol/L Final    Anion Gap 11/02/2023 4 (L)  5 - 14 mmol/L Final    BUN 11/02/2023 46 (H)  9 - 23 mg/dL Final    Creatinine 50/08/3817 2.27 (H)  0.55 - 1.02 mg/dL Final    BUN/Creatinine Ratio 11/02/2023 20   Final    eGFR [name]-EPI (2021) Female 11/02/2023 21 (L)  >=  60 mL/min/1.42m2 Final    Glucose 11/02/2023 85  70 - 179 mg/dL Final    Calcium 57/84/6962 9.1  8.7 - 10.4 mg/dL Final    Lactate, Venous 11/02/2023 1.1  0.5 - 1.8 mmol/L Final    Magnesium 11/02/2023 2.2  1.6 - 2.6 mg/dL Final    Phosphorus 95/28/4132 4.8  2.4 - 5.1 mg/dL Final    WBC 44/12/270 5.5  3.6 - 11.2 10*9/L Final    RBC 11/02/2023 3.59 (L)  3.95 - 5.13 10*12/L Final    HGB 11/02/2023 10.8 (L)  11.3 - 14.9 g/dL Final    HCT 53/66/4403 32.6 (L)  34.0 - 44.0 % Final    MCV 11/02/2023 91.0  77.6 - 95.7 fL Final    MCH 11/02/2023 30.2  25.9 - 32.4 pg Final    MCHC 11/02/2023 33.2  32.0 - 36.0 g/dL Final    RDW 47/42/5956 14.4  12.2 - 15.2 % Final    MPV 11/02/2023 9.1  6.8 - 10.7 fL Final Platelet 11/02/2023 148 (L)  150 - 450 10*9/L Final    Neutrophils % 11/02/2023 66.1  % Final    Lymphocytes % 11/02/2023 17.3  % Final    Monocytes % 11/02/2023 13.6  % Final    Eosinophils % 11/02/2023 2.1  % Final    Basophils % 11/02/2023 0.9  % Final    Absolute Neutrophils 11/02/2023 3.6  1.8 - 7.8 10*9/L Final    Absolute Lymphocytes 11/02/2023 1.0 (L)  1.1 - 3.6 10*9/L Final    Absolute Monocytes 11/02/2023 0.8  0.3 - 0.8 10*9/L Final    Absolute Eosinophils 11/02/2023 0.1  0.0 - 0.5 10*9/L Final    Absolute Basophils 11/02/2023 0.1  0.0 - 0.1 10*9/L Final   Office Visit on 10/27/2023   Component Date Value Ref Range Status    EKG Ventricular Rate 10/27/2023 112  BPM Final    EKG QRS Duration 10/27/2023 130  ms Final    EKG Q-T Interval 10/27/2023 394  ms Final    EKG QTC Calculation 10/27/2023 537  ms Final    EKG Calculated R Axis 10/27/2023 215  degrees Final    EKG Calculated T Axis 10/27/2023 44  degrees Final    QTC Fredericia 10/27/2023 485  ms Final    WBC 10/27/2023 5.9  3.6 - 11.2 10*9/L Final    RBC 10/27/2023 3.94 (L)  3.95 - 5.13 10*12/L Final    HGB 10/27/2023 11.6  11.3 - 14.9 g/dL Final    HCT 38/75/6433 36.4  34.0 - 44.0 % Final    MCV 10/27/2023 92.4  77.6 - 95.7 fL Final    MCH 10/27/2023 29.6  25.9 - 32.4 pg Final    MCHC 10/27/2023 32.0  32.0 - 36.0 g/dL Final    RDW 29/51/8841 15.0  12.2 - 15.2 % Final    MPV 10/27/2023 9.0  6.8 - 10.7 fL Final    Platelet 10/27/2023 158  150 - 450 10*9/L Final    Magnesium 10/27/2023 2.1  1.6 - 2.6 mg/dL Final    PRO-BNP 66/05/3015 9,769.0 (H)  <=300.0 pg/mL Final    Sodium 10/27/2023 139  135 - 145 mmol/L Final    Potassium 10/27/2023 4.0  3.4 - 4.8 mmol/L Final    Chloride 10/27/2023 100  98 - 107 mmol/L Final    CO2 10/27/2023 30.6  20.0 - 31.0 mmol/L Final    Anion Gap 10/27/2023 8  5 - 14 mmol/L Final  BUN 10/27/2023 57 (H)  9 - 23 mg/dL Final    Creatinine 57/84/6962 2.13 (H)  0.55 - 1.02 mg/dL Final    BUN/Creatinine Ratio 10/27/2023 27 Final    eGFR [name]-EPI (2021) Female 10/27/2023 23 (L)  >=60 mL/min/1.78m2 Final    Glucose 10/27/2023 127  70 - 179 mg/dL Final    Calcium 95/28/4132 9.7  8.7 - 10.4 mg/dL Final    Color, UA 44/12/270 Light Yellow   Final    Clarity, UA 10/27/2023 Clear   Final    Specific Gravity, UA 10/27/2023 1.012  1.003 - 1.030 Final    pH, UA 10/27/2023 5.0  5.0 - 9.0 Final    Leukocyte Esterase, UA 10/27/2023 Negative  Negative Final    Nitrite, UA 10/27/2023 Negative  Negative Final    Protein, UA 10/27/2023 Negative  Negative Final    Glucose, UA 10/27/2023 Negative  Negative Final    Ketones, UA 10/27/2023 Negative  Negative Final    Urobilinogen, UA 10/27/2023 <2.0 mg/dL  <5.3 mg/dL Final    Bilirubin, UA 10/27/2023 Negative  Negative Final    Blood, UA 10/27/2023 Small (A)  Negative Final    RBC, UA 10/27/2023 5 (H)  <=4 /HPF Final    WBC, UA 10/27/2023 1  0 - 5 /HPF Final    Squam Epithel, UA 10/27/2023 5  0 - 5 /HPF Final    Bacteria, UA 10/27/2023 Occasional (A)  None Seen /HPF Final    Hyaline Casts, UA 10/27/2023 43 (H)  0 - 1 /LPF Final    Mucus, UA 10/27/2023 Rare (A)  None Seen /HPF Final    Urine Culture, Comprehensive 10/27/2023 Mixed Urogenital Flora   Final   Appointment on 10/15/2023   Component Date Value Ref Range Status    Color, UA 10/15/2023 Light Yellow   Final    Clarity, UA 10/15/2023 Clear   Final    Specific Gravity, UA 10/15/2023 1.010  1.003 - 1.030 Final    pH, UA 10/15/2023 5.0  5.0 - 9.0 Final    Leukocyte Esterase, UA 10/15/2023 Negative  Negative Final    Nitrite, UA 10/15/2023 Negative  Negative Final    Protein, UA 10/15/2023 Negative  Negative Final    Glucose, UA 10/15/2023 Negative  Negative Final    Ketones, UA 10/15/2023 Negative  Negative Final    Urobilinogen, UA 10/15/2023 <2.0 mg/dL  <6.6 mg/dL Final    Bilirubin, UA 10/15/2023 Negative  Negative Final    Blood, UA 10/15/2023 Small (A)  Negative Final    RBC, UA 10/15/2023 1  <=4 /HPF Final    WBC, UA 10/15/2023 2  0 - 5 /HPF Final    Squam Epithel, UA 10/15/2023 2  0 - 5 /HPF Final    Bacteria, UA 10/15/2023 Few (A)  None Seen /HPF Final    Hyaline Casts, UA 10/15/2023 17 (H)  0 - 1 /LPF Final    Mucus, UA 10/15/2023 Occasional (A)  None Seen /HPF Final    Urine Culture, Comprehensive 10/15/2023 >100,000 CFU/mL Klebsiella pneumoniae (A)   Final   Office Visit on 10/06/2023   Component Date Value Ref Range Status    EKG Ventricular Rate 10/06/2023 112  BPM Final    EKG QRS Duration 10/06/2023 128  ms Final    EKG Q-T Interval 10/06/2023 400  ms Final    EKG QTC Calculation 10/06/2023 546  ms Final    EKG Calculated R Axis 10/06/2023 -85  degrees Final  EKG Calculated T Axis 10/06/2023 89  degrees Final    QTC Fredericia 10/06/2023 492  ms Final    Sodium 10/06/2023 130 (L)  135 - 145 mmol/L Final    Potassium 10/06/2023 3.1 (L)  3.4 - 4.8 mmol/L Final    Chloride 10/06/2023 94 (L)  98 - 107 mmol/L Final    CO2 10/06/2023 25.2  20.0 - 31.0 mmol/L Final    Anion Gap 10/06/2023 11  5 - 14 mmol/L Final    BUN 10/06/2023 110 (H)  9 - 23 mg/dL Final    Creatinine 16/09/9603 2.37 (H)  0.55 - 1.02 mg/dL Final    BUN/Creatinine Ratio 10/06/2023 46   Final    eGFR [name]-EPI (2021) Female 10/06/2023 20 (L)  >=60 mL/min/1.87m2 Final    Glucose 10/06/2023 244 (H)  70 - 179 mg/dL Final    Calcium 54/08/8118 9.9  8.7 - 10.4 mg/dL Final    PRO-BNP 14/78/2956 14,251.0 (H)  <=300.0 pg/mL Final    Magnesium 10/06/2023 2.1  1.6 - 2.6 mg/dL Final   No results displayed because visit has over 200 results.      Office Visit on 09/23/2023   Component Date Value Ref Range Status    Sodium 09/23/2023 131 (L)  135 - 145 mmol/L Final    Potassium 09/23/2023 4.7  3.4 - 4.8 mmol/L Final    Chloride 09/23/2023 98  98 - 107 mmol/L Final    CO2 09/23/2023 23.4  20.0 - 31.0 mmol/L Final    Anion Gap 09/23/2023 10  5 - 14 mmol/L Final    BUN 09/23/2023 100 (H)  9 - 23 mg/dL Final    Creatinine 21/30/8657 4.46 (H)  0.55 - 1.02 mg/dL Final    BUN/Creatinine Ratio 09/23/2023 22   Final    eGFR [name]-EPI (2021) Female 09/23/2023 9 (L)  >=60 mL/min/1.61m2 Final    Glucose 09/23/2023 190 (H)  70 - 179 mg/dL Final    Calcium 84/69/6295 9.5  8.7 - 10.4 mg/dL Final    PRO-BNP 28/41/3244 17,133.0 (H)  <=300.0 pg/mL Final    Magnesium 09/23/2023 2.4  1.6 - 2.6 mg/dL Final    Free T4 12/30/7251 1.39  0.89 - 1.76 ng/dL Final    TSH 66/44/0347 2.346  0.550 - 4.780 uIU/mL Final   There may be more visits with results that are not included.       Lab Results   Component Value Date    PRO-BNP 16,566.0 (H) 12/10/2023    PRO-BNP 9,362.0 (H) 11/11/2023    PRO-BNP 6,696.0 (H) 11/01/2023    Creatinine 2.16 (H) 12/10/2023    Creatinine 2.06 (H) 11/11/2023    Creatinine 0.79 01/06/2011    BUN 66 (H) 12/10/2023    BUN 60 (H) 11/11/2023    BUN 20 01/06/2011    Sodium 136 12/10/2023    Sodium 140 01/06/2011    Potassium 3.4 12/10/2023    Potassium 4.1 01/06/2011    CO2 29.8 12/10/2023    CO2 30 01/06/2011    Magnesium 2.0 12/10/2023    Magnesium 2.0 01/06/2011    Total Bilirubin 0.9 12/10/2023    INR 1.04 09/24/2023    INR 1.0 01/06/2011       Lab Results   Component Value Date    Digoxin Level 0.9 10/01/2020       Lab Results   Component Value Date    TSH 2.346 09/23/2023    Cholesterol 95 12/06/2021    Triglycerides 66 12/06/2021  HDL 42 12/06/2021    Non-HDL Cholesterol 53 (L) 12/06/2021    LDL Calculated 40 12/06/2021       Lab Results   Component Value Date    WBC 11.2 12/10/2023    WBC 12.5 (H) 01/06/2011    HGB 11.3 12/10/2023    HGB 12.9 01/06/2011    HCT 34.6 12/10/2023    HCT 40.8 01/06/2011    Platelet 219 12/10/2023    Platelet 260 01/06/2011       Pertinent Test Results from Today:  None    Other pertinent records were reviewed.    The following are further history from the patient's EPIC record for reference:     Past Medical History:   Diagnosis Date    Acute exacerbation of CHF (congestive heart failure) (CMS-HCC) 08/19/2023    Acute kidney injury superimposed on chronic kidney disease (CMS-HCC) 10/11/2020    Acute on chronic diastolic (congestive) heart failure (CMS-HCC) 08/23/2020    Acute on chronic diastolic congestive heart failure (CMS-HCC) 08/23/2020    AKI (acute kidney injury) (CMS-HCC) 04/14/2015    Lab Results  Component  Value  Date     CREATININE  1.90 (H)  06/12/2021     Had a bump in her creatinine when she was taking her diuretics every day.  She is currently taking 40 mg daily of torsemide and 50 mg of spironolactone.  Her volume status is fragile.  Previously when she stopped her diuretic she becomes short of breath.  Plan: We will check her BMP today.  We will likely have to go to 40    Anemia 08/22/2019    Iron deficiency anemia          Lab Results      Component    Value    Date           WBC    9.2    03/21/2021           RBC    3.67 (L)    03/21/2021           HGB    9.9 (L)    03/21/2021           HCT    30.7 (L)    03/21/2021           MCV    83.7    03/21/2021           MCH    26.9    03/21/2021           MCHC    32.1    03/21/2021           RDW    21.9 (H)    03/21/2021           PLT    318     Arthritis     Calculus of kidney     Calculus of ureter     CHF (congestive heart failure) (CMS-HCC)     Chronic atrial fibrillation (CMS-HCC) 07/20/2019    COPD (chronic obstructive pulmonary disease) (CMS-HCC)     Diabetes (CMS-HCC)     Gangrenous cholecystitis 10/11/2020    Generalized edema  06/17/2021    GERD (gastroesophageal reflux disease)     Hydronephrosis     Hypertension     Hyponatremia 10/11/2020    Intermediate coronary syndrome (CMS-HCC) 03/13/2014    Lower extremity edema 09/28/2020    Lumbar stenosis     Microscopic hematuria  Nausea alone     Nephrolithiasis 04/17/2016    Neuropathy     Nocturia     Nocturia 07/01/2017    Other chronic cystitis     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  See plans for anemia, AKI  We are also tapering her gabapentin.  Currently on 300 mg 3 times daily.  We will decrease it to twice daily, and then nightly.  She states she is not having any recurrence of her pain.    Pulmonary hypertension (CMS-HCC)     Renal colic     Shortness of breath 04/28/2020    Sleep apnea     Unstable angina pectoris (CMS-HCC) 03/13/2014       Past Surgical History:   Procedure Laterality Date    BACK SURGERY  1995    CARPAL TUNNEL RELEASE Left 2014    HYSTERECTOMY  1971    IR INSERT CHOLECYSTOSMY TUBE PERCUTANEOUS  10/02/2020    IR INSERT CHOLECYSTOSMY TUBE PERCUTANEOUS 10/02/2020 Braulio Conte, MD IMG VIR H&V [facility]    LUMBAR DISC SURGERY      PR REMOVAL GALLBLADDER N/A 10/06/2020    Procedure: CHOLECYSTECTOMY;  Surgeon: Katherina Mires, MD;  Location: MAIN OR [facility];  Service: Trauma    PR RIGHT HEART CATH O2 SATURATION & CARDIAC OUTPUT N/A 09/30/2020    Procedure: Right Heart Catheterization;  Surgeon: Neal Dy, MD;  Location: [facility] CATH;  Service: Cardiology    PR RIGHT HEART CATH O2 SATURATION & CARDIAC OUTPUT N/A 01/09/2021    Procedure: Right Heart Catheterization;  Surgeon: Lesle Reek, MD;  Location: [facility] CATH;  Service: Cardiology

## 2023-12-15 NOTE — Unmapped (Signed)
Today,    MEDICATIONS:  NO medication changes today.    Call if you have questions about your medications.    LABS:  We will call you if your labs need attention.    NEXT APPOINTMENT:  Return to clinic in 1 month with me      In general, to take care of your heart failure:  -Limit your fluid intake to 2 Liters (half-gallon) per day.    -Limit your salt intake to ideally 2-3 grams (2000-3000 mg) per day.  -Weigh yourself daily and record, and bring that weight diary to your next appointment.  (Weight gain of 2-3 pounds in 1 day typically means fluid weight.)    The medications for your heart are to help your heart and help you live longer.    Please contact us before stopping any of your heart medications.    Call the clinic at [phone] with questions.  Our clinic fax number is [phone].  If you need to reschedule future appointments, please call [phone] or [phone]  My office number (c/o De Burrs RN) is [phone] if you need further assistance.    Please do not send a MyChart message for potentially life-threatening symptoms.  Please call 911 for a true medical emergency.    To learn more about heart failure, please read [place] Learning to Live with Heart Failure - Available online at:  [email or web address] - open the window for Medical Management and click the link Living with Heart Failure   (Can search [facility] heart failure on the web to find the link.)

## 2024-01-11 NOTE — Unmapped (Signed)
[facility] Refill Coordination Note    Specialty Medication(s) to be Shipped:   Cardiology: Vyndamax    Other medication(s) to be shipped: No additional medications requested for fill at this time     Alyric Lones, DOB: 04-03-1941  Phone: [phone] (home)       All above HIPAA information was verified with patient.     Was a Nurse, learning disability used for this call? No    Completed refill call assessment today to schedule patient's medication shipment from the [facility]  [phone]).  All relevant notes have been reviewed.     Specialty medication(s) and dose(s) confirmed: Regimen is correct and unchanged.   Changes to medications: Lajuan reports no changes at this time.  Changes to insurance: No  New side effects reported not previously addressed with a pharmacist or physician: None reported  Questions for the pharmacist: No    Confirmed patient received a Conservation officer, historic buildings and a Surveyor, mining with first shipment. The patient will receive a drug information handout for each medication shipped and additional FDA Medication Guides as required.       DISEASE/MEDICATION-SPECIFIC INFORMATION        N/A    SPECIALTY MEDICATION ADHERENCE     Medication Adherence    Patient reported X missed doses in the last month: 0  Specialty Medication: Vyndamax 61mg   Informant: patient              Were doses missed due to medication being on hold? No    Vyndamax 61 mg: ~10 days of medicine on hand. Pt was sitting and unable to get to her meds to count exact amount.      REFERRAL TO PHARMACIST     Referral to the pharmacist: Not needed      [facility]     Shipping address confirmed in Epic.       Delivery Scheduled: Yes, Expected medication delivery date: 01/14/24.     Medication will be delivered via Next Day Courier to the prescription address in Epic WAM.    Camillo Flaming, PharmD   [facility]  Specialty Pharmacist

## 2024-01-13 MED FILL — VYNDAMAX 61 MG CAPSULE: ORAL | 30 days supply | Qty: 30 | Fill #1

## 2024-01-26 ENCOUNTER — Encounter: Admit: 2024-01-26 | Discharge: 2024-01-26 | Payer: MEDICARE

## 2024-01-26 ENCOUNTER — Ambulatory Visit: Admit: 2024-01-26 | Discharge: 2024-01-26 | Payer: MEDICARE | Attending: Adult Health | Primary: Adult Health

## 2024-01-26 DIAGNOSIS — I43 Cardiomyopathy in diseases classified elsewhere: Principal | ICD-10-CM

## 2024-01-26 DIAGNOSIS — E854 Organ-limited amyloidosis: Principal | ICD-10-CM

## 2024-01-26 DIAGNOSIS — E852 Heredofamilial amyloidosis, unspecified: Principal | ICD-10-CM

## 2024-01-26 DIAGNOSIS — I5032 Chronic diastolic (congestive) heart failure: Principal | ICD-10-CM

## 2024-01-26 LAB — CBC
HEMATOCRIT: 32 % — ABNORMAL LOW (ref 34.0–44.0)
HEMOGLOBIN: 10.7 g/dL — ABNORMAL LOW (ref 11.3–14.9)
MEAN CORPUSCULAR HEMOGLOBIN CONC: 33.4 g/dL (ref 32.0–36.0)
MEAN CORPUSCULAR HEMOGLOBIN: 29.6 pg (ref 25.9–32.4)
MEAN CORPUSCULAR VOLUME: 88.6 fL (ref 77.6–95.7)
MEAN PLATELET VOLUME: 8.6 fL (ref 6.8–10.7)
PLATELET COUNT: 217 10*9/L (ref 150–450)
RED BLOOD CELL COUNT: 3.61 10*12/L — ABNORMAL LOW (ref 3.95–5.13)
RED CELL DISTRIBUTION WIDTH: 14.1 % (ref 12.2–15.2)
WBC ADJUSTED: 7.8 10*9/L (ref 3.6–11.2)

## 2024-01-26 LAB — BASIC METABOLIC PANEL
ANION GAP: 13 mmol/L (ref 5–14)
BLOOD UREA NITROGEN: 64 mg/dL — ABNORMAL HIGH (ref 9–23)
BUN / CREAT RATIO: 32
CALCIUM: 9.8 mg/dL (ref 8.7–10.4)
CHLORIDE: 96 mmol/L — ABNORMAL LOW (ref 98–107)
CO2: 29.3 mmol/L (ref 20.0–31.0)
CREATININE: 2.03 mg/dL — ABNORMAL HIGH (ref 0.55–1.02)
EGFR CKD-EPI (2021) FEMALE: 24 mL/min/{1.73_m2} — ABNORMAL LOW (ref >=60)
GLUCOSE RANDOM: 121 mg/dL (ref 70–179)
POTASSIUM: 4.1 mmol/L (ref 3.4–4.8)
SODIUM: 138 mmol/L (ref 135–145)

## 2024-01-26 LAB — MAGNESIUM: MAGNESIUM: 2.3 mg/dL (ref 1.6–2.6)

## 2024-01-26 LAB — PRO-BNP: PRO-BNP: 7489 pg/mL — ABNORMAL HIGH (ref <=300.0)

## 2024-01-26 MED ORDER — TORSEMIDE 20 MG TABLET
ORAL_TABLET | Freq: Every day | ORAL | 3 refills | 90.00 days | Status: CP
Start: 2024-01-26 — End: 2025-01-25

## 2024-01-26 MED ADMIN — vutrisiran (AMVUTTRA) syringe 25 mg: 25 mg | SUBCUTANEOUS | @ 21:00:00 | Stop: 2024-01-26

## 2024-01-26 NOTE — Unmapped (Signed)
Today,    MEDICATIONS:  NO medication changes today.  - Keep on torsemide 80mg  a day  Call if you have questions about your medications.    LABS:  We will call you if your labs need attention.    NEXT APPOINTMENT:  Return to clinic in 3 month with me      In general, to take care of your heart failure:  -Limit your fluid intake to 2 Liters (half-gallon) per day.    -Limit your salt intake to ideally 2-3 grams (2000-3000 mg) per day.  -Weigh yourself daily and record, and bring that weight diary to your next appointment.  (Weight gain of 2-3 pounds in 1 day typically means fluid weight.)    The medications for your heart are to help your heart and help you live longer.    Please contact us before stopping any of your heart medications.    Call the clinic at [phone] with questions.  Our clinic fax number is [phone].  If you need to reschedule future appointments, please call [phone] or [phone]  My office number (c/o De Burrs RN) is [phone] if you need further assistance.    Please do not send a MyChart message for potentially life-threatening symptoms.  Please call 911 for a true medical emergency.    To learn more about heart failure, please read [place] Learning to Live with Heart Failure - Available online at:  [email or web address] - open the window for Medical Management and click the link Living with Heart Failure   (Can search [facility] heart failure on the web to find the link.)

## 2024-01-26 NOTE — Unmapped (Signed)
[facility] HF Amyloid [facility] Note    Referring Provider: Montez Hageman, AGNP  [address]  [address]  [place],  [place] [address]   Primary Provider: Artelia Laroche, MD  [address]  [place] [address]   Other Providers:  Dr Luretha Murphy, Dr Wonda Cheng    Reason for Visit:  Barbara Huber is a 83 y.o. female being seen for routine visit and continued care of cardiac amyloidosis .    Assessment & Plan:  1. 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 SPEP/UPEP/free light chains without concern. 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 Daughter is informed and has seen cardiology [name]).   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 2nd injection July 2024, 3rd in November 2024. Continue vitamin A supplementation.   - DC'd Jardiance 10mg  daily due to recurrent UTIs  - Weight at home has been 181-184 on torsemide 80mg  daily, improved pro-BNP to 7489  - metolazone 5mg  PRN if weight is not trending down with increased torsemide  - Taking midodrine 5mg  TID with BP in 90-110s/60-70s at home. Tried stopping PM dose for concern it was contributing to insomnia, but no improvement in sleep. Sleep improved on its own. Continue to monitor BP and can increase to 10mg  for SBP <90   - Continue Tafamidis 61mg  daily. Consider switch to acoramidis for worsening symptoms.   - spironolactone was stopped during admission for AKI, but she has resumed 12.5mg  daily.   - She is not interested in CardioMEMS at this time.   1b. Goals of Care  - Had a discussion at prior visit w/ pt and daughter Barbara regarding pt's goals of care.   We discussed role of palliative care (has seen Dr Garner Nash) and hospice (when appropriate). She may wish to return to hospital if needed, but likely not ICU level care or invasive monitoring (such as swan)    2. Persistent atrial fibrillation  - CHA2DS2-VASc score = 5 (1-gender, 2-Age, 1-DM, 1-HTN)  - We had previously stopped Eliquis given recurrent anemia, shared decision making w/ pt and daughter. She was not interested in [place] at the time.   - Eliquis 2.5mg  BID restarted during hospitalization - will closely follow hemoglobin in follow up. Hemoglobin has been stable, 11.3 on 12/13, 10.7 today  - On amiodarone 200mg  daily - in SR by exam today, s/p DCCV on 11/17/23  - For recurrent AF, she wouldn't want invasive procedures (like PPM and AVN ablation)  - amiodarone monitoring - last PFTs in 04/2021. TSH (09/23/23) /LFTs (08/31/23) stable at last check. Repeat labs due in March.     3. [name]  - Cr baseline around 2.3-3.0  - Peaked at 5.4 during admission, 2.93 on discharge  - today Cr 2.03, K 4.1, pro-BNP 7489    4. COPD, OSA  - home O2 3-4L [place] - followed by pulm  - on Advair and Spiriva  - Uses trilogy at night     5. Iron deficiency anemia  - As above, follow hemoglobin now that she is back on Eliquis   - taking Iron  MWF  Lab Results   Component Value Date    FERRITIN 336.3 (H) 08/19/2023     Lab Results   Component Value Date    HGB 10.7 (L) 01/26/2024     6. Anxiety  - previously on Zoloft  - Tried Remeron, but discontinued in setting of worsening HF symptoms    7. DM  - Off Insulin with A1c <7  - Followed by PCP   Lab Results   Component Value Date    A1C 5.8 (H) 08/30/2023     8. Chronic back pain  - follows with pain clinic, on oxycodone  - gabapentin BID PRN   - Continue flexeril 5mg  PRN nightly, which she tolerated previously during last flare up of her back pain (feb-May 2024)    Follow-up:  Return in about 3 months (around 04/25/2024).     History of Present Illness:  Ahnyla Huber is a 83 y.o. female with PMHx of COPD on 3L home [place], CHF, [name], [name], [name] She was initially admitted with decompensated [name] in 2021 in setting of  Septic Shock d/t Gangrenous Cholecystitis s/p cholecystectomy. She underwent further evaluation with PYP scan which showed TTR amyloid. She has since been followed in clinic for management of her [name]. In 2022, had numerous admissions for weakness/urosepsis, shortness of breath/orthopnea/weight gain, lightheadedness/SOB/Blurry vision found to be anemic.    Summary of most recent visits:  - 01/16/22 [facility]): Feeling well from a cardiac standpoint. Started Zoloft for anxiety  - 02/13/22 [facility]): Weight 210-211. Feeling well. Taking extra torsemide 20mg  about every other day, which was stopped for elevated Cr.   - 03/13/22 ([name]): Weight 201-216, took metolazone PRN, weight stabilized at 210lbs. Considering cardioMEMS.   - 05/13/22 ([name]): Had 3 iron infusions. Doing well w/ volume, weight 211-212lbs. Cooking, staying active around house.   - 05/21/22 ([name]): CP/SOB. Weight 217 from 213. She took metolazone x1 dose.   - 05/22/22 ([name]): Ed follow up. Back down to 213lbs. Still having CP w/ certain movement.   - 06/05/22 ([name]): Weight 214-216lb, should be 212lb.   - 06/09/22, 06/11/22, 06/22/22 ([name]): Weight down from 217 to 213lb, 211lb at home. Feeling well by 6/26.   - 07/15/22 ([name]): Switched inhaler back to Advair/Spiriva with improvement in breathing and O2 sat. Weight 207lbs, feeling more like herself.   - 08/19/22 ([name]): Feeling well. Weight 208-210lbs.   - 10/22/22 ([name]): Feeling well. Weight has been 210-214lbs.   - 12/04/22 ([name]): doing well overall, weight a bit up so taking extra torsemide.   - 02/10/23 ([name]): Weight 213-214lbs, feeling well, eating a lot. Had taken metolazone earlier in the week.   - 02/23/23 [facility]): Hospital follow up after mechanical fall with nondisplaced fractures of the left superior and inferior pubic rami. Taking torsemide 40mg , weight 211.   - 04/14/23 ([name]): Weight 210-212. 1st Amvuttra  - 07/14/23 [facility]): Weight 211-212 on torsemide 60mg  daily. 2nd Amvuttra.   - 08/18/23-08/23/23 (Admission): fatigue, weakness, DOE, recent prednisone course for gout. Admitted to [facility], poor response to IV Lasix and was changed to IV Bumex 3mg  BID and metolazone. [name] held for low BP. Cr increased to 4.0 with diuresis. She went back to the ER on 8/24 for abdominal pain. She was felt to be euvolemic at a weight of 207lb and IV diuretics were held. She was discharged on torsemide 60mg  daily on 8/26.   - 08/27/23 [facility]): hospital follow up. Felt to  be dry for weight down to 204 at home, Cr 3.4 BUN 96. Changed torsemide from 60 daily to 40/20.   - 08/30/23-09/02/23 (Admission): Admitted for fatigue and SOB. Diuresed  with improvement in dyspnea. Weight on discharge was 204.   - 09/06/23 [facility]): Hospital follow up. Her weight continued to decrease on her scale by about 1lb per day. Weight 201 that morning. She was a little lightheaded and felt weak. Continued on torsemide 60mg  daily.  Cr 2.61, pro-BNP 4575  - 09/08/23 ([name]/Phone): Weight 201, breathing well.   - 09/16/23 ([facility]): Weight 205, not peeing much with torsemide 60mg . Feeling full in belly. Cr 3.11,  pro-BNP 14492. Metolazone and extra 40 torsemide.   - 09/17/23 ([facility]): Weight 203, feeling better. Continue torsemide 60/40 through weekend.   - 09/23/23 ([name]): n 9/25 reporting hat she didn't pee much the night before and weight was 203lb that morning. Increased by 2 lbs overnight. Instructed to take metolazone 2.5mg  and extra 40 torsemide in the afternoon. Today her weight was up and so she requested a visit. She didn't urinate much and this morning was 205lbs. Extra torsemide 60mg  then metolazone in morning and return for diuresis clinic.   - 09/24/23 (Diuresis): IV Lasix. Arranged for direct admission.   - 9/27-10/5 (Admission): She initially underwent inotrope assisted diuresis with dopamine and furosemide gtt, which was stopped on 9/30 after episodes of hypotension requiring NE and midodrine. Norepinephrine stopped and pulmonary wedge pressure was 12 prior to pulling swan on on 10/1. Midodrine maintained to maximize preload. Furosemide gtt transitioned to spot IV lasix and then was transitioned to home torsemide 60mg  daily on 10/2. Palliative care was consulted and family decided on outpatient evaluation. She was discharged with a pro-BNP of 17K down from 26K and weight of 209lb. Discharged with new home regimen torsemide 60mg  daily, metolazone prn per clinic, midodrine 5mg  TID, Tafamidis 61mg  daily. She was continued on chronic home amiodarone 200 mg daily for atrial fibrillation and restarted on Eliquis 2.5mg  BID.   - 10/9/242 [facility]): hospital follow up. Weight 193-195. Torsemide 60mg g daily. In AF RVR by EKG. Amio increased to 200mg  BID.   - 10/27/23 ([name]): Weight up to 201 but had been 196-197.  - 11/11/23 [facility]): Hospital follow up, [name] for hypoxia 2/2 malfunction of portable O2. Weight 194 on torsemide 60mg .  - 11/17/23 (EP Procedure): Cardioversion  - 12/15/23 ([name]): Weight 186-189 on torsemide 60mg . Pro-BNP up. Increased torsemide to 80mg .    Interval history  Today she presents for follow up. Since her last visit, weight got down as low as 181b on the 80mg  torsemide, but never got as low as 180lbs. Weight has been staying around 181-182lbs, sometimes as high as 184. She has remained on 80mg  torsemide. She is feel ing pretty good. She is not moving or walking much, but is not having problems with swelling or dyspnea. Her daughter encourages her to move more, but she doesn't really want to. They installed an elevated toilet seat in the bathroom, but she hasn't been using it. She's still using the bedside commode. She thinks she remains in normal rhythm.     Cardiovascular History & Procedures:    Cath / PCI:  none    CV Surgery:   none    EP Procedures and Devices:  11/17/23 - DCCV  01/10/21 - DCCV    Non-Invasive Evaluation(s):    Echo:  08/19/23  1. The left ventricle is normal in size with moderately increased wall  thickness.    2. 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    3. The left ventricular systolic function is hyperdynamic, LVEF is visually  estimated at >70%.    4. Mitral annular calcification is present (moderate).    5. The mitral valve leaflets are mildly thickened with normal leaflet  mobility.    6. The aortic valve is trileaflet with mildly thickened leaflets with normal  excursion.    7. The left atrium is moderately dilated in size.    8. The right ventricle is normal in size, with normal systolic function.    02/17/23    1. The left ventricle is normal in size with moderately increased wall  thickness.    2. The left ventricular systolic function is hyperdynamic, LVEF is visually  estimated at >70%.    3. The left atrium is moderately dilated in size.    4. The right ventricle is normal in size, with normal systolic function.    5. There is mild pulmonary hypertension.    12/09/21  Summary    1. The left ventricle is normal in size with severely increased wall  thickness.    2. The left ventricular systolic function is hyperdynamic, LVEF is visually  estimated at >70%.    3. There is grade III diastolic dysfunction (severely elevated filling  pressure).    4. The left atrium is moderately to severely dilated in size.    5. The right ventricle is mildly dilated in size, with normal systolic  function.    6. The right atrium is mildly dilated  in size.    7. IVC size and inspiratory change suggest elevated right atrial pressure.  (10-20 mmHg).    04/24/21   1. The left ventricle is normal in size with mildly increased wall  thickness.    2. The left ventricular systolic function is normal, LVEF is visually  estimated at > 55%.    3. There is grade II diastolic dysfunction (elevated filling pressure).    4. Mitral annular calcification is present (mild).    5. The left atrium is mildly dilated in size.    6. The right ventricle is mildly dilated in size, with low normal systolic  function.    7. IVC size and inspiratory change suggest elevated right atrial pressure.  (10-20 mmHg).    09/30/20  Summary    1. Limited study to assess systolic function.    2. IVC size and inspiratory change suggest normal right atrial pressure.  (0-5 mmHg).    3. The left ventricle is normal in size with mildly to moderately increased  wall thickness.    4. The left ventricular systolic function is normal with no obvious wall  motion abnormalities, LVEF is visually estimated at > 55%.    5. Mitral annular calcification is present.    6. The left atrium is mildly dilated in size.    7. The right ventricle is upper normal in size, with reduced systolic  Function.    Cardiac CT/MRI/Nuclear Tests:   None    6 Minute Walk:  None    Cardiopulmonary Stress Tests:   None               Other Past Medical History:  See below for the complete EPIC list of past medical and surgical history.      Allergies:  Nitrofurantoin and Lipitor [atorvastatin]    Current Medications:  Current Outpatient Medications  Medication Sig Dispense Refill    ACCU-CHEK GUIDE TEST STRIPS Strp       ACCU-CHEK SOFTCLIX LANCETS lancets       acetaminophen (TYLENOL) 500 MG tablet Take 2 tablets (1,000 mg total) by mouth Three (3) times a day as needed for pain.      amiodarone (PACERONE) 200 MG tablet Take 1 tablet (200 mg total) by mouth daily. 30 tablet 11    apixaban (ELIQUIS) 2.5 mg Tab Take 1 tablet (2.5 mg total) by mouth two (2) times a day. 180 tablet 3    cranberry 500 mg cap Take 500 mg by mouth daily with evening meal.      cyclobenzaprine (FLEXERIL) 5 MG tablet Take 1 tablet (5 mg total) by mouth two (2) times a day as needed for muscle spasms. 60 tablet 0    ergocalciferol-1,250 mcg, 50,000 unit, (DRISDOL) 1,250 mcg (50,000 unit) capsule Take 1 capsule (1,250 mcg total) by mouth once a week. 12 capsule 3    estradioL (ESTRACE) 0.01 % (0.1 mg/gram) vaginal cream Insert 2 g into the vagina Two (2) times a week. 42.5 g 3    ferrous sulfate 325 (65 FE) MG EC tablet Take 1 tablet (325 mg total) by mouth Every Monday, Wednesday, and Friday. 36 tablet 3    fluticasone propion-salmeterol (ADVAIR HFA) 115-21 mcg/actuation inhaler Inhale 2 puffs two (2) times a day. 12 g 11    fosfomycin (MONUROL) 3 gram Pack Take 3 g by mouth once a week. 4 packet 2    gabapentin (NEURONTIN) 100 MG capsule Take 1 capsule (100 mg total) by mouth two (2) times a day. 60 capsule 11    lidocaine (ASPERCREME) 4 % patch Place 1 patch on the skin daily. Remove after 12 hours. 30 patch 2    metOLazone (ZAROXOLYN) 5 MG tablet Take 1 tablet (5 mg total) by mouth daily as needed (up to 3x weekly when instructed by cardiology clinic).      midodrine (PROAMATINE) 5 MG tablet Take 1 tablet (5 mg total) by mouth three (3) times a day. 90 tablet 11    montelukast (SINGULAIR) 10 mg tablet Take 1 tablet (10 mg total) by mouth every morning. 90 tablet 2    OXYGEN-AIR DELIVERY SYSTEMS MISC 5 L by Miscellaneous route. Currently using 3  L/min via [place]      pantoprazole (PROTONIX) 20 MG tablet TAKE 1 TABLET(20 MG) BY MOUTH DAILY 90 tablet 3    rosuvastatin (CRESTOR) 5 MG tablet TAKE 1 TABLET EVERY OTHER DAY 24 tablet 1    spironolactone (ALDACTONE) 25 MG tablet Take 0.5 tablets (12.5 mg total) by mouth daily. 30 tablet 2    tafamidis (VYNDAMAX) 61 mg cap Take 1 capsule (61 mg) by mouth daily. 30 capsule 11    tiotropium bromide (SPIRIVA RESPIMAT) 2.5 mcg/actuation inhalation mist Inhale 2 puffs daily. 4 g 11    traZODone (DESYREL) 50 MG tablet Take 1 tablet (50 mg total) by mouth nightly as needed for sleep. 30 tablet 3    vitamin A-3,000 mcg RAE, 10,000 UNIT, 3,000 mcg RAE (10,000 UNIT) capsule Take 1 capsule (3,000 mcg of RAE total) by mouth daily.      vutrisiran (AMVUTTRA) 25 mg/0.5 mL injection Inject 0.5 mL (25 mg total) under the skin once. Every 12 Weeks      albuterol HFA 90 mcg/actuation inhaler Inhale 2 puffs every eight (8) hours as needed for wheezing. (Patient not taking: Reported on 01/26/2024)  NARCAN 4 mg/actuation nasal spray 1 spray into alternating nostrils once as needed (opioid overdose). PRN - Emergency use.      torsemide (DEMADEX) 20 MG tablet Take 4 tablets (80 mg total) by mouth daily. 360 tablet 3     No current facility-administered medications for this visit.       Family History:  The patient's family history includes Cancer in her father; Hypertension in her mother; No Known Problems in her brother, brother, maternal aunt, maternal grandfather, maternal grandmother, maternal uncle, paternal aunt, paternal grandfather, paternal grandmother, paternal uncle, sister, sister, and another family member.    Social history:  She  reports that she quit smoking about 6 years ago. Her smoking use included cigarettes. She has never used smokeless tobacco. She reports that she does not drink alcohol and does not use drugs.    Review of Systems:  As per HPI.  Rest of the review of ten systems is negative or unremarkable except as stated above.    Physical Exam:  VITAL SIGNS:   Vitals:    01/26/24 1456   BP: 126/59   Pulse: 75   Resp: 20   SpO2: 97%     Wt Readings from Last 3 Encounters:   01/26/24 84.6 kg (186 lb 9.6 oz)   01/26/24 84.6 kg (186 lb 9.6 oz)   12/15/23 86.9 kg (191 lb 9.6 oz)      Today's Body mass index is 30.12 kg/m??.   Height: 167.6 cm (5' 6)  CONSTITUTIONAL: well-appearing in no acute distress  EYES: Conjunctivae and sclerae clear and anicteric.  ENT: Benign.   CARDIOVASCULAR: JVP  not visible up neck with HOB at 90degrees. Rate and rhythm are irregularly irregular.  There is no lifts or heaves.  Normal S1, S2. There is no murmur, gallops or rubs.  Radial and pedal pulses are 2+, bilaterally.   There is trace edema to ankles, bilaterally.   RESPIRATORY: Decreased breath sounds at bases. There are no wheezes.  GASTROINTESTINAL: Soft, non-tender, with audible bowel sounds. Abdomen nondistended.  Liver is nonpalpable.  SKIN: No rashes, ecchymosis or petechiae.  Warm, well perfused.   MUSCULOSKELETAL:  no joint swelling   NEURO/PSYCH: Appropriate mood and affect. Alert and oriented to person, place, and time. No gross motor or sensory deficits evident.    Pertinent Laboratory Studies:   Office Visit on 01/26/2024   Component Date Value Ref Range Status    Sodium 01/26/2024 138  135 - 145 mmol/L Final    Potassium 01/26/2024 4.1  3.4 - 4.8 mmol/L Final    Chloride 01/26/2024 96 (L)  98 - 107 mmol/L Final    CO2 01/26/2024 29.3  20.0 - 31.0 mmol/L Final    Anion Gap 01/26/2024 13  5 - 14 mmol/L Final    BUN 01/26/2024 64 (H)  9 - 23 mg/dL Final    Creatinine 16/09/9603 2.03 (H)  0.55 - 1.02 mg/dL Final    BUN/Creatinine Ratio 01/26/2024 32   Final    eGFR [name]-EPI (2021) Female 01/26/2024 24 (L)  >=60 mL/min/1.33m2 Final    Glucose 01/26/2024 121  70 - 179 mg/dL Final    Calcium 54/08/8118 9.8  8.7 - 10.4 mg/dL Final    Magnesium 14/78/2956 2.3  1.6 - 2.6 mg/dL Final    PRO-BNP 21/30/8657 7,489.0 (H)  <=300.0 pg/mL Final    WBC 01/26/2024 7.8  3.6 - 11.2 10*9/L Final    RBC 01/26/2024 3.61 (L)  3.95 - 5.13 10*12/L Final  HGB 01/26/2024 10.7 (L)  11.3 - 14.9 g/dL Final    HCT 71/05/2693 32.0 (L)  34.0 - 44.0 % Final    MCV 01/26/2024 88.6  77.6 - 95.7 fL Final    MCH 01/26/2024 29.6  25.9 - 32.4 pg Final    MCHC 01/26/2024 33.4  32.0 - 36.0 g/dL Final    RDW 85/46/2703 14.1  12.2 - 15.2 % Final    MPV 01/26/2024 8.6  6.8 - 10.7 fL Final    Platelet 01/26/2024 217  150 - 450 10*9/L Final   Office Visit on 12/15/2023   Component Date Value Ref Range Status    EKG Ventricular Rate 12/15/2023 76  BPM Final    EKG Atrial Rate 12/15/2023 76  BPM Final    EKG P-R Interval 12/15/2023 186  ms Final    EKG QRS Duration 12/15/2023 110  ms Final    EKG Q-T Interval 12/15/2023 440  ms Final    EKG QTC Calculation 12/15/2023 495  ms Final    EKG Calculated P Axis 12/15/2023 40  degrees Final    EKG Calculated R Axis 12/15/2023 213  degrees Final    EKG Calculated T Axis 12/15/2023 12  degrees Final    QTC Fredericia 12/15/2023 475  ms Final    PRO-BNP 12/15/2023 15,177.0 (H)  <=300.0 pg/mL Final    Sodium 12/15/2023 136  135 - 145 mmol/L Final    Potassium 12/15/2023 3.9  3.4 - 4.8 mmol/L Final    Chloride 12/15/2023 94 (L)  98 - 107 mmol/L Final    CO2 12/15/2023 28.1  20.0 - 31.0 mmol/L Final    Anion Gap 12/15/2023 14  5 - 14 mmol/L Final    BUN 12/15/2023 64 (H)  9 - 23 mg/dL Final    Creatinine 50/08/3817 2.09 (H)  0.55 - 1.02 mg/dL Final    BUN/Creatinine Ratio 12/15/2023 31   Final    eGFR [name]-EPI (2021) Female 12/15/2023 23 (L)  >=60 mL/min/1.90m2 Final    Glucose 12/15/2023 133  70 - 179 mg/dL Final    Calcium 29/93/7169 9.7  8.7 - 10.4 mg/dL Final    Magnesium 67/89/3810 1.9  1.6 - 2.6 mg/dL Final   Appointment on 12/10/2023   Component Date Value Ref Range Status    Sodium 12/10/2023 136  135 - 145 mmol/L Final    Potassium 12/10/2023 3.4  3.4 - 4.8 mmol/L Final    Chloride 12/10/2023 93 (L)  98 - 107 mmol/L Final    CO2 12/10/2023 29.8  20.0 - 31.0 mmol/L Final    Anion Gap 12/10/2023 13  5 - 14 mmol/L Final    BUN 12/10/2023 66 (H)  9 - 23 mg/dL Final    Creatinine 17/51/0258 2.16 (H)  0.55 - 1.02 mg/dL Final    BUN/Creatinine Ratio 12/10/2023 31   Final    eGFR [name]-EPI (2021) Female 12/10/2023 22 (L)  >=60 mL/min/1.85m2 Final    Glucose 12/10/2023 182 (H)  70 - 179 mg/dL Final    Calcium 52/77/8242 9.4  8.7 - 10.4 mg/dL Final    Albumin 35/36/1443 2.9 (L)  3.4 - 5.0 g/dL Final    Total Protein 12/10/2023 7.3  5.7 - 8.2 g/dL Final    Total Bilirubin 12/10/2023 0.9  0.3 - 1.2 mg/dL Final    AST 15/40/0867 30  <=34 U/L Final    ALT 12/10/2023 9 (L)  10 - 49 U/L Final    Alkaline Phosphatase 12/10/2023 274 (H)  46 - 116 U/L Final    PRO-BNP 12/10/2023 16,566.0 (H)  <=300.0 pg/mL Final    Magnesium 12/10/2023 2.0 1.6 - 2.6 mg/dL Final    WBC 16/09/9603 11.2  3.6 - 11.2 10*9/L Final    RBC 12/10/2023 3.87 (L)  3.95 - 5.13 10*12/L Final    HGB 12/10/2023 11.3  11.3 - 14.9 g/dL Final    HCT 54/08/8118 34.6  34.0 - 44.0 % Final    MCV 12/10/2023 89.4  77.6 - 95.7 fL Final    MCH 12/10/2023 29.1  25.9 - 32.4 pg Final    MCHC 12/10/2023 32.6  32.0 - 36.0 g/dL Final    RDW 14/78/2956 14.4  12.2 - 15.2 % Final    MPV 12/10/2023 8.3  6.8 - 10.7 fL Final    Platelet 12/10/2023 219  150 - 450 10*9/L Final   Admission on 11/17/2023, Discharged on 11/17/2023   Component Date Value Ref Range Status    EKG Ventricular Rate 11/17/2023 105  BPM Final    EKG QRS Duration 11/17/2023 124  ms Final    EKG Q-T Interval 11/17/2023 420  ms Final    EKG QTC Calculation 11/17/2023 555  ms Final    EKG Calculated R Axis 11/17/2023 265  degrees Final    EKG Calculated T Axis 11/17/2023 52  degrees Final    QTC Fredericia 11/17/2023 506  ms Final    EKG Ventricular Rate 11/17/2023 67  BPM Final    EKG Atrial Rate 11/17/2023 67  BPM Final    EKG P-R Interval 11/17/2023 208  ms Final    EKG QRS Duration 11/17/2023 126  ms Final    EKG Q-T Interval 11/17/2023 510  ms Final    EKG QTC Calculation 11/17/2023 538  ms Final    EKG Calculated P Axis 11/17/2023 58  degrees Final    EKG Calculated R Axis 11/17/2023 260  degrees Final    EKG Calculated T Axis 11/17/2023 11  degrees Final    QTC Fredericia 11/17/2023 529  ms Final   Office Visit on 11/11/2023   Component Date Value Ref Range Status    Sodium 11/11/2023 137  135 - 145 mmol/L Final    Potassium 11/11/2023 4.1  3.4 - 4.8 mmol/L Final    Chloride 11/11/2023 96 (L)  98 - 107 mmol/L Final    CO2 11/11/2023 32.1 (H)  20.0 - 31.0 mmol/L Final    Anion Gap 11/11/2023 9  5 - 14 mmol/L Final    BUN 11/11/2023 60 (H)  9 - 23 mg/dL Final    Creatinine 21/30/8657 2.06 (H)  0.55 - 1.02 mg/dL Final    BUN/Creatinine Ratio 11/11/2023 29   Final    eGFR [name]-EPI (2021) Female 11/11/2023 24 (L)  >=60 mL/min/1.63m2 Final    Glucose 11/11/2023 199 (H)  70 - 179 mg/dL Final    Calcium 84/69/6295 9.7  8.7 - 10.4 mg/dL Final    Magnesium 28/41/3244 2.1  1.6 - 2.6 mg/dL Final    PRO-BNP 12/30/7251 9,362.0 (H)  <=300.0 pg/mL Final    WBC 11/11/2023 8.6  3.6 - 11.2 10*9/L Final    RBC 11/11/2023 3.89 (L)  3.95 - 5.13 10*12/L Final    HGB 11/11/2023 11.4  11.3 - 14.9 g/dL Final    HCT 66/44/0347 35.6  34.0 - 44.0 % Final    MCV 11/11/2023 91.5  77.6 - 95.7 fL Final    MCH 11/11/2023 29.3  25.9 - 32.4  pg Final    MCHC 11/11/2023 32.0  32.0 - 36.0 g/dL Final    RDW 16/09/9603 13.6  12.2 - 15.2 % Final    MPV 11/11/2023 9.1  6.8 - 10.7 fL Final    Platelet 11/11/2023 197  150 - 450 10*9/L Final   Admission on 11/01/2023, Discharged on 11/02/2023   Component Date Value Ref Range Status    EKG Ventricular Rate 11/01/2023 111  BPM Final    EKG QRS Duration 11/01/2023 130  ms Final    EKG Q-T Interval 11/01/2023 394  ms Final    EKG QTC Calculation 11/01/2023 535  ms Final    EKG Calculated R Axis 11/01/2023 217  degrees Final    EKG Calculated T Axis 11/01/2023 7  degrees Final    QTC Fredericia 11/01/2023 483  ms Final    PRO-BNP 11/01/2023 6,696.0 (H)  <=300.0 pg/mL Final    Sodium 11/01/2023 134 (L)  135 - 145 mmol/L Final    Potassium 11/01/2023 3.4  3.4 - 4.8 mmol/L Final    Chloride 11/01/2023 99  98 - 107 mmol/L Final    CO2 11/01/2023 30.0  20.0 - 31.0 mmol/L Final    Anion Gap 11/01/2023 5  5 - 14 mmol/L Final    BUN 11/01/2023 40 (H)  9 - 23 mg/dL Final    Creatinine 54/08/8118 2.07 (H)  0.55 - 1.02 mg/dL Final    BUN/Creatinine Ratio 11/01/2023 19   Final    eGFR [name]-EPI (2021) Female 11/01/2023 24 (L)  >=60 mL/min/1.60m2 Final    Glucose 11/01/2023 152  70 - 179 mg/dL Final    Calcium 14/78/2956 8.8  8.7 - 10.4 mg/dL Final    Albumin 21/30/8657 3.2 (L)  3.4 - 5.0 g/dL Final    Total Protein 11/01/2023 6.6  5.7 - 8.2 g/dL Final    Total Bilirubin 11/01/2023 0.7  0.3 - 1.2 mg/dL Final    AST 84/69/6295 24  <=34 U/L Final    ALT 11/01/2023 8 (L)  10 - 49 U/L Final    Alkaline Phosphatase 11/01/2023 164 (H)  46 - 116 U/L Final    hsTroponin I 11/01/2023 58 (HH)  <=34 ng/L Final    WBC 11/01/2023 7.2  3.6 - 11.2 10*9/L Final    RBC 11/01/2023 3.86 (L)  3.95 - 5.13 10*12/L Final    HGB 11/01/2023 11.5  11.3 - 14.9 g/dL Final    HCT 28/41/3244 35.5  34.0 - 44.0 % Final    MCV 11/01/2023 92.0  77.6 - 95.7 fL Final    MCH 11/01/2023 29.7  25.9 - 32.4 pg Final    MCHC 11/01/2023 32.3  32.0 - 36.0 g/dL Final    RDW 12/30/7251 14.1  12.2 - 15.2 % Final    MPV 11/01/2023 9.0  6.8 - 10.7 fL Final    Platelet 11/01/2023 154  150 - 450 10*9/L Final    Neutrophils % 11/01/2023 73.0  % Final    Lymphocytes % 11/01/2023 13.3  % Final    Monocytes % 11/01/2023 11.0  % Final    Eosinophils % 11/01/2023 1.7  % Final    Basophils % 11/01/2023 1.0  % Final    Absolute Neutrophils 11/01/2023 5.2  1.8 - 7.8 10*9/L Final    Absolute Lymphocytes 11/01/2023 1.0 (L)  1.1 - 3.6 10*9/L Final    Absolute Monocytes 11/01/2023 0.8  0.3 - 0.8 10*9/L Final    Absolute Eosinophils 11/01/2023 0.1  0.0 -  0.5 10*9/L Final    Absolute Basophils 11/01/2023 0.1  0.0 - 0.1 10*9/L Final    Specimen Source 11/01/2023 Venous   Final    FIO2 Venous 11/01/2023 Not Specified   Final    pH, Venous 11/01/2023 7.37  7.32 - 7.43 Final    pCO2, Ven 11/01/2023 57  40 - 60 mm Hg Final    pO2, Ven 11/01/2023 35  30 - 55 mm Hg Final    HCO3, Ven 11/01/2023 33 (H)  22 - 27 mmol/L Final    Base Excess, Ven 11/01/2023 5.6 (H)  -2.0 - 2.0 Final    O2 Saturation, Venous 11/01/2023 64.1  40.0 - 85.0 % Final    Lactate, Venous 11/01/2023 1.9 (H)  0.5 - 1.8 mmol/L Final    hsTroponin I 11/01/2023 105 (HH)  <=34 ng/L Final    delta hsTroponin I 11/01/2023 47 (HH)  <=7 ng/L Final    hsTroponin I 11/01/2023 199 (HH)  <=34 ng/L Final    delta hsTroponin I 11/01/2023 94 (HH)  <=7 ng/L Final    SARS-CoV-2 PCR 11/01/2023 Negative  Negative Final    Influenza A 11/01/2023 Negative  Negative Final    Influenza B 11/01/2023 Negative  Negative Final    RSV 11/01/2023 Negative  Negative Final    hsTroponin I 11/02/2023 218 (HH)  <=34 ng/L Final    hsTroponin I 11/02/2023 207 (HH)  <=34 ng/L Final    Sodium 11/02/2023 135  135 - 145 mmol/L Final    Potassium 11/02/2023 3.3 (L)  3.4 - 4.8 mmol/L Final    Chloride 11/02/2023 98  98 - 107 mmol/L Final    CO2 11/02/2023 33.0 (H)  20.0 - 31.0 mmol/L Final    Anion Gap 11/02/2023 4 (L)  5 - 14 mmol/L Final    BUN 11/02/2023 46 (H)  9 - 23 mg/dL Final    Creatinine 04/54/0981 2.27 (H)  0.55 - 1.02 mg/dL Final    BUN/Creatinine Ratio 11/02/2023 20   Final    eGFR [name]-EPI (2021) Female 11/02/2023 21 (L)  >=60 mL/min/1.77m2 Final    Glucose 11/02/2023 85  70 - 179 mg/dL Final    Calcium 19/14/7829 9.1  8.7 - 10.4 mg/dL Final    Lactate, Venous 11/02/2023 1.1  0.5 - 1.8 mmol/L Final    Magnesium 11/02/2023 2.2  1.6 - 2.6 mg/dL Final    Phosphorus 56/21/3086 4.8  2.4 - 5.1 mg/dL Final    WBC 57/84/6962 5.5  3.6 - 11.2 10*9/L Final    RBC 11/02/2023 3.59 (L)  3.95 - 5.13 10*12/L Final    HGB 11/02/2023 10.8 (L)  11.3 - 14.9 g/dL Final    HCT 95/28/4132 32.6 (L)  34.0 - 44.0 % Final    MCV 11/02/2023 91.0  77.6 - 95.7 fL Final    MCH 11/02/2023 30.2  25.9 - 32.4 pg Final    MCHC 11/02/2023 33.2  32.0 - 36.0 g/dL Final    RDW 44/12/270 14.4  12.2 - 15.2 % Final    MPV 11/02/2023 9.1  6.8 - 10.7 fL Final    Platelet 11/02/2023 148 (L)  150 - 450 10*9/L Final    Neutrophils % 11/02/2023 66.1  % Final    Lymphocytes % 11/02/2023 17.3  % Final    Monocytes % 11/02/2023 13.6  % Final    Eosinophils % 11/02/2023 2.1  % Final    Basophils % 11/02/2023 0.9  % Final    Absolute Neutrophils  11/02/2023 3.6  1.8 - 7.8 10*9/L Final    Absolute Lymphocytes 11/02/2023 1.0 (L)  1.1 - 3.6 10*9/L Final    Absolute Monocytes 11/02/2023 0.8  0.3 - 0.8 10*9/L Final    Absolute Eosinophils 11/02/2023 0.1  0.0 - 0.5 10*9/L Final    Absolute Basophils 11/02/2023 0.1  0.0 - 0.1 10*9/L Final   Office Visit on 10/27/2023   Component Date Value Ref Range Status    EKG Ventricular Rate 10/27/2023 112  BPM Final    EKG QRS Duration 10/27/2023 130  ms Final    EKG Q-T Interval 10/27/2023 394  ms Final    EKG QTC Calculation 10/27/2023 537  ms Final    EKG Calculated R Axis 10/27/2023 215  degrees Final    EKG Calculated T Axis 10/27/2023 44  degrees Final    QTC Fredericia 10/27/2023 485  ms Final    WBC 10/27/2023 5.9  3.6 - 11.2 10*9/L Final    RBC 10/27/2023 3.94 (L)  3.95 - 5.13 10*12/L Final    HGB 10/27/2023 11.6  11.3 - 14.9 g/dL Final    HCT 16/09/9603 36.4  34.0 - 44.0 % Final    MCV 10/27/2023 92.4  77.6 - 95.7 fL Final    MCH 10/27/2023 29.6  25.9 - 32.4 pg Final    MCHC 10/27/2023 32.0  32.0 - 36.0 g/dL Final    RDW 54/08/8118 15.0  12.2 - 15.2 % Final    MPV 10/27/2023 9.0  6.8 - 10.7 fL Final    Platelet 10/27/2023 158  150 - 450 10*9/L Final    Magnesium 10/27/2023 2.1  1.6 - 2.6 mg/dL Final    PRO-BNP 14/78/2956 9,769.0 (H)  <=300.0 pg/mL Final    Sodium 10/27/2023 139  135 - 145 mmol/L Final    Potassium 10/27/2023 4.0  3.4 - 4.8 mmol/L Final    Chloride 10/27/2023 100  98 - 107 mmol/L Final    CO2 10/27/2023 30.6  20.0 - 31.0 mmol/L Final    Anion Gap 10/27/2023 8  5 - 14 mmol/L Final    BUN 10/27/2023 57 (H)  9 - 23 mg/dL Final    Creatinine 21/30/8657 2.13 (H)  0.55 - 1.02 mg/dL Final    BUN/Creatinine Ratio 10/27/2023 27   Final    eGFR [name]-EPI (2021) Female 10/27/2023 23 (L)  >=60 mL/min/1.69m2 Final    Glucose 10/27/2023 127  70 - 179 mg/dL Final    Calcium 84/69/6295 9.7  8.7 - 10.4 mg/dL Final    Color, UA 28/41/3244 Light Yellow   Final    Clarity, UA 10/27/2023 Clear   Final    Specific Gravity, UA 10/27/2023 1.012  1.003 - 1.030 Final    pH, UA 10/27/2023 5.0  5.0 - 9.0 Final    Leukocyte Esterase, UA 10/27/2023 Negative  Negative Final    Nitrite, UA 10/27/2023 Negative  Negative Final    Protein, UA 10/27/2023 Negative  Negative Final    Glucose, UA 10/27/2023 Negative  Negative Final    Ketones, UA 10/27/2023 Negative  Negative Final    Urobilinogen, UA 10/27/2023 <2.0 mg/dL  <0.1 mg/dL Final    Bilirubin, UA 10/27/2023 Negative  Negative Final    Blood, UA 10/27/2023 Small (A)  Negative Final    RBC, UA 10/27/2023 5 (H)  <=4 /HPF Final    WBC, UA 10/27/2023 1  0 - 5 /HPF Final    Squam Epithel, UA 10/27/2023 5  0 - 5 /HPF  Final    Bacteria, UA 10/27/2023 Occasional (A)  None Seen /HPF Final    Hyaline Casts, UA 10/27/2023 43 (H)  0 - 1 /LPF Final    Mucus, UA 10/27/2023 Rare (A)  None Seen /HPF Final    Urine Culture, Comprehensive 10/27/2023 Mixed Urogenital Flora   Final   Appointment on 10/15/2023   Component Date Value Ref Range Status    Color, UA 10/15/2023 Light Yellow   Final    Clarity, UA 10/15/2023 Clear   Final    Specific Gravity, UA 10/15/2023 1.010  1.003 - 1.030 Final    pH, UA 10/15/2023 5.0  5.0 - 9.0 Final    Leukocyte Esterase, UA 10/15/2023 Negative  Negative Final    Nitrite, UA 10/15/2023 Negative  Negative Final    Protein, UA 10/15/2023 Negative  Negative Final    Glucose, UA 10/15/2023 Negative  Negative Final    Ketones, UA 10/15/2023 Negative  Negative Final    Urobilinogen, UA 10/15/2023 <2.0 mg/dL  <1.6 mg/dL Final    Bilirubin, UA 10/15/2023 Negative  Negative Final    Blood, UA 10/15/2023 Small (A)  Negative Final    RBC, UA 10/15/2023 1  <=4 /HPF Final    WBC, UA 10/15/2023 2  0 - 5 /HPF Final    Squam Epithel, UA 10/15/2023 2  0 - 5 /HPF Final    Bacteria, UA 10/15/2023 Few (A)  None Seen /HPF Final    Hyaline Casts, UA 10/15/2023 17 (H)  0 - 1 /LPF Final    Mucus, UA 10/15/2023 Occasional (A)  None Seen /HPF Final    Urine Culture, Comprehensive 10/15/2023 >100,000 CFU/mL Klebsiella pneumoniae (A)   Final   Office Visit on 10/06/2023   Component Date Value Ref Range Status    EKG Ventricular Rate 10/06/2023 112  BPM Final    EKG QRS Duration 10/06/2023 128  ms Final    EKG Q-T Interval 10/06/2023 400  ms Final    EKG QTC Calculation 10/06/2023 546  ms Final    EKG Calculated R Axis 10/06/2023 -85  degrees Final    EKG Calculated T Axis 10/06/2023 89  degrees Final    QTC Fredericia 10/06/2023 492  ms Final    Sodium 10/06/2023 130 (L)  135 - 145 mmol/L Final    Potassium 10/06/2023 3.1 (L)  3.4 - 4.8 mmol/L Final    Chloride 10/06/2023 94 (L)  98 - 107 mmol/L Final    CO2 10/06/2023 25.2  20.0 - 31.0 mmol/L Final    Anion Gap 10/06/2023 11  5 - 14 mmol/L Final    BUN 10/06/2023 110 (H)  9 - 23 mg/dL Final    Creatinine 10/96/0454 2.37 (H)  0.55 - 1.02 mg/dL Final    BUN/Creatinine Ratio 10/06/2023 46   Final    eGFR [name]-EPI (2021) Female 10/06/2023 20 (L)  >=60 mL/min/1.23m2 Final    Glucose 10/06/2023 244 (H)  70 - 179 mg/dL Final    Calcium 09/81/1914 9.9  8.7 - 10.4 mg/dL Final    PRO-BNP 78/29/5621 14,251.0 (H)  <=300.0 pg/mL Final    Magnesium 10/06/2023 2.1  1.6 - 2.6 mg/dL Final   No results displayed because visit has over 200 results.      There may be more visits with results that are not included.       Lab Results   Component Value Date    PRO-BNP 7,489.0 (H) 01/26/2024    PRO-BNP 15,177.0 (H) 12/15/2023  PRO-BNP 16,566.0 (H) 12/10/2023    Creatinine 2.03 (H) 01/26/2024    Creatinine 2.09 (H) 12/15/2023    Creatinine 0.79 01/06/2011    BUN 64 (H) 01/26/2024    BUN 64 (H) 12/15/2023    BUN 20 01/06/2011    Sodium 138 01/26/2024    Sodium 140 01/06/2011    Potassium 4.1 01/26/2024    Potassium 4.1 01/06/2011    CO2 29.3 01/26/2024    CO2 30 01/06/2011    Magnesium 2.3 01/26/2024    Magnesium 2.0 01/06/2011    Total Bilirubin 0.9 12/10/2023    INR 1.04 09/24/2023    INR 1.0 01/06/2011       Lab Results   Component Value Date    Digoxin Level 0.9 10/01/2020       Lab Results   Component Value Date    TSH 2.346 09/23/2023    Cholesterol 95 12/06/2021    Triglycerides 66 12/06/2021    HDL 42 12/06/2021    Non-HDL Cholesterol 53 (L) 12/06/2021    LDL Calculated 40 12/06/2021       Lab Results   Component Value Date    WBC 7.8 01/26/2024    WBC 12.5 (H) 01/06/2011 HGB 10.7 (L) 01/26/2024    HGB 12.9 01/06/2011    HCT 32.0 (L) 01/26/2024    HCT 40.8 01/06/2011    Platelet 217 01/26/2024    Platelet 260 01/06/2011       Pertinent Test Results from Today:  None    Other pertinent records were reviewed.    The following are further history from the patient's EPIC record for reference:     Past Medical History:   Diagnosis Date    Acute exacerbation of CHF (congestive heart failure) (CMS-HCC) 08/19/2023    Acute kidney injury superimposed on chronic kidney disease (CMS-HCC) 10/11/2020    Acute on chronic diastolic (congestive) heart failure (CMS-HCC) 08/23/2020    Acute on chronic diastolic congestive heart failure (CMS-HCC) 08/23/2020    AKI (acute kidney injury) (CMS-HCC) 04/14/2015    Lab Results  Component  Value  Date     CREATININE  1.90 (H)  06/12/2021     Had a bump in her creatinine when she was taking her diuretics every day.  She is currently taking 40 mg daily of torsemide and 50 mg of spironolactone.  Her volume status is fragile.  Previously when she stopped her diuretic she becomes short of breath.  Plan: We will check her BMP today.  We will likely have to go to 40    Anemia 08/22/2019    Iron deficiency anemia          Lab Results      Component    Value    Date           WBC    9.2    03/21/2021           RBC    3.67 (L)    03/21/2021           HGB    9.9 (L)    03/21/2021           HCT    30.7 (L)    03/21/2021           MCV    83.7    03/21/2021           MCH    26.9    03/21/2021  MCHC    32.1    03/21/2021           RDW    21.9 (H)    03/21/2021           PLT    318     Arthritis     Calculus of kidney     Calculus of ureter     CHF (congestive heart failure) (CMS-HCC)     Chronic atrial fibrillation (CMS-HCC) 07/20/2019    COPD (chronic obstructive pulmonary disease) (CMS-HCC)     Diabetes (CMS-HCC)     Gangrenous cholecystitis 10/11/2020    Generalized edema  06/17/2021    GERD (gastroesophageal reflux disease)     Hydronephrosis     Hypertension Hyponatremia 10/11/2020    Intermediate coronary syndrome (CMS-HCC) 03/13/2014    Lower extremity edema 09/28/2020    Lumbar stenosis     Microscopic hematuria     Nausea alone     Nephrolithiasis 04/17/2016    Neuropathy     Nocturia     Nocturia 07/01/2017    Other chronic cystitis     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  See plans for anemia, AKI  We are also tapering her gabapentin.  Currently on 300 mg 3 times daily.  We will decrease it to twice daily, and then nightly.  She states she is not having any recurrence of her pain.    Pulmonary hypertension (CMS-HCC)     Renal colic     Shortness of breath 04/28/2020    Sleep apnea     Unstable angina pectoris (CMS-HCC) 03/13/2014       Past Surgical History:   Procedure Laterality Date    BACK SURGERY  1995    CARPAL TUNNEL RELEASE Left 2014    HYSTERECTOMY  1971    IR INSERT CHOLECYSTOSTOMY TUBE PERCUTANEOUS  10/02/2020    IR INSERT CHOLECYSTOSMY TUBE PERCUTANEOUS 10/02/2020 Braulio Conte, MD IMG VIR H&V [facility]    LUMBAR DISC SURGERY      PR REMOVAL GALLBLADDER N/A 10/06/2020    Procedure: CHOLECYSTECTOMY;  Surgeon: Katherina Mires, MD;  Location: MAIN OR [facility];  Service: Trauma    PR RIGHT HEART CATH O2 SATURATION & CARDIAC OUTPUT N/A 09/30/2020    Procedure: Right Heart Catheterization;  Surgeon: Neal Dy, MD;  Location: [facility] CATH;  Service: Cardiology    PR RIGHT HEART CATH O2 SATURATION & CARDIAC OUTPUT N/A 01/09/2021    Procedure: Right Heart Catheterization;  Surgeon: Lesle Reek, MD;  Location: [facility] CATH;  Service: Cardiology

## 2024-01-26 NOTE — Unmapped (Signed)
1550 [facility] to [facility] for Amvuttra injection Q 12 Weeks. Patient presents with oxygen at continuous 3 Liters via [place], sats within normal range, in wheelchair with daughter as caregiver, VSS. Patient denies any medical changes or recent infection or fever.    1603 Amvuttra (vutrisiran) 25 mg administered subcutaneously to right upper arm, patient tolerated well. Gauze and Band-Aid applied to injection site.     Patient discharged in stable condition with no acute distress accompanied by her daughter.

## 2024-01-31 NOTE — Unmapped (Signed)
Addended by: Marguerita Beards R on: 01/31/2024 09:00 PM     Modules accepted: Orders

## 2024-02-03 NOTE — Unmapped (Signed)
Thank you for your referral to [facility]. We look forward to providing excellent care for your patient. However, upon review, we have noted that:     1) the diagnosis listed is a secondary not primary diagnosis; according to PGDM and CMS guidelines home health requires a primary diagnosis and    2)  supporting documentation within the face to face encounter to support the need for home health and correlate with the referral diagnosis.    Thank you,    Duncan Dull, LPN  Central Intake Nurse  [facility]

## 2024-02-04 DIAGNOSIS — I43 Cardiomyopathy in diseases classified elsewhere: Principal | ICD-10-CM

## 2024-02-04 DIAGNOSIS — E854 Organ-limited amyloidosis: Principal | ICD-10-CM

## 2024-02-04 NOTE — Unmapped (Signed)
[facility] Refill Coordination Note    Specialty Medication(s) to be Shipped:   Cardiology: Vyndamax    Other medication(s) to be shipped: No additional medications requested for fill at this time     Barbara Huber, DOB: 1941/06/30  Phone: [phone] (home)       All above HIPAA information was verified with patient.     Was a Nurse, learning disability used for this call? No    Completed refill call assessment today to schedule patient's medication shipment from the [facility] Delivery Pharm[facility]  [phone]).  All relevant notes have been reviewed.     Specialty medication(s) and dose(s) confirmed: Regimen is correct and unchanged.   Changes to medications: Emelyn reports no changes at this time.  Changes to insurance: No  New side effects reported not previously addressed with a pharmacist or physician: None reported  Questions for the pharmacist: No    Confirmed patient received a Conservation officer, historic buildings and a Surveyor, mining with first shipment. The patient will receive a drug information handout for each medication shipped and additional FDA Medication Guides as required.       DISEASE/MEDICATION-SPECIFIC INFORMATION        N/A    SPECIALTY MEDICATION ADHERENCE     Medication Adherence    Patient reported X missed doses in the last month: 0  Specialty Medication: Vyndamax 61mg   Informant: patient              Were doses missed due to medication being on hold? No    Vyndamax 61 mg: 10 days of medicine on hand       REFERRAL TO PHARMACIST     Referral to the pharmacist: Not needed      [facility]     Shipping address confirmed in Epic.       Delivery Scheduled: Yes, Expected medication delivery date: 02/10/24.     Medication will be delivered via Next Day Courier to the prescription address in Epic WAM.    Camillo Flaming, PharmD   [facility]  Specialty Pharmacist

## 2024-02-07 ENCOUNTER — Inpatient Hospital Stay: Admit: 2024-02-07 | Payer: MEDICARE

## 2024-02-07 ENCOUNTER — Encounter: Admit: 2024-02-07 | Discharge: 2024-03-07 | Payer: MEDICARE

## 2024-02-07 ENCOUNTER — Encounter: Admit: 2024-02-07 | Payer: MEDICARE

## 2024-02-08 NOTE — Unmapped (Signed)
RPM declined takes own vitals

## 2024-02-09 MED FILL — VYNDAMAX 61 MG CAPSULE: ORAL | 30 days supply | Qty: 30 | Fill #2

## 2024-02-13 MED ORDER — ACCU-CHEK SOFTCLIX LANCETS
0 refills | 0.00 days
Start: 2024-02-13 — End: ?

## 2024-02-14 MED ORDER — ACCU-CHEK SOFTCLIX LANCETS
11 refills | 0.00 days | Status: CP
Start: 2024-02-14 — End: ?

## 2024-02-18 MED ORDER — CYCLOBENZAPRINE 5 MG TABLET
ORAL_TABLET | 0 refills | 0.00 days | Status: CP
Start: 2024-02-18 — End: ?

## 2024-02-18 NOTE — Unmapped (Signed)
 Refill request received for patient.      Medication Requested: Cyclobenzaprine  Last Office Visit: 01/26/2024   Next Office Visit: 04/26/2024  Last Prescriber: Excell Seltzer    Nurse refill requirements met? No  If not met, why: Medication not on approved list for [facility] to refill       Sent to: Provider for signing  If sent to provider, which provider?: Baker

## 2024-02-19 DIAGNOSIS — Z09 Encounter for follow-up examination after completed treatment for conditions other than malignant neoplasm: Principal | ICD-10-CM

## 2024-02-19 MED ORDER — ROSUVASTATIN 5 MG TABLET
ORAL_TABLET | 0 refills | 0.00 days
Start: 2024-02-19 — End: ?

## 2024-02-21 ENCOUNTER — Encounter: Admit: 2024-02-21 | Discharge: 2024-02-22 | Payer: MEDICARE

## 2024-02-21 DIAGNOSIS — N184 Chronic kidney disease, stage 4 (severe): Principal | ICD-10-CM

## 2024-02-21 DIAGNOSIS — E559 Vitamin D deficiency, unspecified: Principal | ICD-10-CM

## 2024-02-21 DIAGNOSIS — E1122 Type 2 diabetes mellitus with diabetic chronic kidney disease: Principal | ICD-10-CM

## 2024-02-21 DIAGNOSIS — K117 Disturbances of salivary secretion: Principal | ICD-10-CM

## 2024-02-21 DIAGNOSIS — J9611 Chronic respiratory failure with hypoxia: Principal | ICD-10-CM

## 2024-02-21 DIAGNOSIS — Z794 Long term (current) use of insulin: Principal | ICD-10-CM

## 2024-02-21 DIAGNOSIS — I5032 Chronic diastolic (congestive) heart failure: Principal | ICD-10-CM

## 2024-02-21 NOTE — Unmapped (Signed)
 Internal Medicine Clinic Visit  Person Contacted: Barbara Huber  Contact Phone number: [phone] (home)   Is there someone else in the room? Yes. What is your relationship? daughter. Do you want this person here for the visit? Yes .  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 Followed by Dr Emi Holes  Diuresis can be complicated by worsening of her [name], but currently stable   Most recent Echo108/2024:   Summary     Summary    1. The left ventricle is normal in size with moderately increased wall  thickness.    2. 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    3. The left ventricular systolic function is hyperdynamic, LVEF is visually  estimated at >70%.    4. Mitral annular calcification is present (moderate).    5. The mitral valve leaflets are mildly thickened with normal leaflet  mobility.    6. The aortic valve is trileaflet with mildly thickened leaflets with normal  excursion.    7. The left atrium is moderately dilated in size.    8. The right ventricle is normal in size, with normal systolic function.  Euvolemic by report  Plan:   Cont current meds  Follows up with [facility] cardiology       Chronic respiratory failure with hypoxia (CMS-HCC)  Overview:  Last Assessment & Plan:   Stable, on O2 now, improved s/p resolved CHF exac  - Concern with some deconditioning from recent hospital stay and other co-morbidities  - Referral to [facility] for RN, PT, SW      Xerostomia  Likely from midodrin, 5 mg tid  Will discuss with cardiology decreasing  Type 2 diabetes mellitus with stage 4 chronic kidney disease, with long-term current use of insulin (CMS-HCC)  Overview:  Lab Results   Component Value Date    A1C 5.8 (H) 08/30/2023    A1C 5.8 (H) 04/14/2023    A1C 5.7 (H) 10/29/2022     Diabetes, off meds, blood sugars have been in range  Plan: check A1C -order  placed        Orders:  - Hemoglobin A1c; Future    Chronic kidney disease ([name]), stage IV (severe) (CMS-HCC)  Overview:  Lab Results   Component Value Date    CREATININE 2.03 (H) 01/26/2024     Creatinine stable  Anticipate maintaining current dose of torsemide 40 mg every a.m., 20 mg q pm. Also on spirinolactone.   -will follow up creatinine      Orders:  -     Basic Metabolic Panel; Future    Vitamin D deficiency  -     Vitamin D 25 Hydroxy (25OH D2 + D3); Future    Next Visit:   Discuss DNR status (has been back and forth) , annual visit  Return in about 3 months (around 05/20/2024).    HPI:   Pt is a 83 y.o. female with a history of cardiac amyloid. [name], COPD, DM, [name]   Feels pretty good.   Has a dry mouth. Is on midodrine.   Urinating okay.   Breathing is good, still on 3 liters  BP 177/66, HR is 64.   Blood sugars-off insulin: 82 this morning.   Not much appetite. Sleeping fine.     Lab Results   Component Value Date    VITDTOTAL 33.7 07/14/2023  VITDTOTAL 7.1 (L) 02/16/2023       Problem List:  Barbara Huber Active Problem List   Diagnosis    Spinal stenosis of lumbar region    Thoracic or lumbosacral neuritis or radiculitis    Lumbosacral spondylosis    COPD (chronic obstructive pulmonary disease) (CMS-HCC)    Sleep apnea in adult    Essential hypertension    Type 2 diabetes mellitus with stage 4 chronic kidney disease, with long-term current use of insulin (CMS-HCC)    Chronic cystitis    Incomplete bladder emptying    Peripheral neuropathy    Chronic respiratory failure with hypoxia (CMS-HCC)    Persistent atrial fibrillation (CMS-HCC)    Anemia in chronic kidney disease    Chronic kidney disease ([name]), stage IV (severe) (CMS-HCC)    Pulmonary hypertension (CMS-HCC)    Chronic prescription opiate use    Dyspnea on exertion    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)    Cardiac amyloidosis (CMS-HCC)    Recurrent cold sores    Dyspepsia    Iron deficiency anemia due to chronic blood loss    Neuropathy    Left hip pain    Familial amyloid heart disease (CMS-HCC)    Hereditary amyloidosis (CMS-HCC)    Osteoporosis with current pathological fracture    Troponin level elevated    Thrombocytopenia (CMS-HCC)    Difficulty walking       Medications:  Reviewed in EPIC    Social history:   Lives with daughter    Physical Exam:   Vital Signs:  BP Readings from Last 3 Encounters:   02/21/24 117/60   02/18/24 124/64   02/16/24 112/62      Wt Readings from Last 3 Encounters:   02/21/24 81.6 kg (180 lb)   01/26/24 84.6 kg (186 lb 9.6 oz)   01/26/24 84.6 kg (186 lb 9.6 oz)      General appearance -   looks well    Records review  Lab Results   Component Value Date    A1C 5.8 (H) 08/30/2023        Health Maintenance Due   Topic Date Due    COVID-19 Vaccine (5 - 2024-25 season) 08/29/2023      The ASCVD Risk score (Arnett DK, et al., 2019) failed to calculate.     Medication adherence and barriers to the treatment plan have been addressed. Opportunities to optimize healthy behaviors have been discussed. Barbara Huber / caregiver voiced understanding.      The Barbara Huber reports they are physically located in [place] and is currently: at home. I conducted a audio/video visit. I spent  53m 57s on the video call with the Barbara Huber. I spent an additional 15 minutes on pre- and post-visit activities on the date of service .

## 2024-02-22 MED ORDER — ROSUVASTATIN 5 MG TABLET
ORAL_TABLET | 3 refills | 0.00 days | Status: CP
Start: 2024-02-22 — End: ?

## 2024-02-23 DIAGNOSIS — N184 Chronic kidney disease, stage 4 (severe): Principal | ICD-10-CM

## 2024-02-23 DIAGNOSIS — N3020 Other chronic cystitis without hematuria: Principal | ICD-10-CM

## 2024-02-23 DIAGNOSIS — G8929 Other chronic pain: Principal | ICD-10-CM

## 2024-02-23 DIAGNOSIS — E1122 Type 2 diabetes mellitus with diabetic chronic kidney disease: Principal | ICD-10-CM

## 2024-02-23 DIAGNOSIS — I4819 Other persistent atrial fibrillation: Principal | ICD-10-CM

## 2024-02-23 DIAGNOSIS — R262 Difficulty in walking, not elsewhere classified: Principal | ICD-10-CM

## 2024-02-23 DIAGNOSIS — M48061 Spinal stenosis, lumbar region without neurogenic claudication: Principal | ICD-10-CM

## 2024-02-23 DIAGNOSIS — R5383 Other fatigue: Principal | ICD-10-CM

## 2024-02-23 DIAGNOSIS — I272 Pulmonary hypertension, unspecified: Principal | ICD-10-CM

## 2024-02-23 DIAGNOSIS — E854 Organ-limited amyloidosis: Principal | ICD-10-CM

## 2024-02-23 DIAGNOSIS — M810 Age-related osteoporosis without current pathological fracture: Principal | ICD-10-CM

## 2024-02-23 DIAGNOSIS — U099 Post covid-19 condition, unspecified: Principal | ICD-10-CM

## 2024-02-23 DIAGNOSIS — Z9981 Dependence on supplemental oxygen: Principal | ICD-10-CM

## 2024-02-23 DIAGNOSIS — M47817 Spondylosis without myelopathy or radiculopathy, lumbosacral region: Principal | ICD-10-CM

## 2024-02-23 DIAGNOSIS — I10 Essential (primary) hypertension: Principal | ICD-10-CM

## 2024-02-23 DIAGNOSIS — J449 Chronic obstructive pulmonary disease, unspecified: Principal | ICD-10-CM

## 2024-02-23 DIAGNOSIS — D631 Anemia in chronic kidney disease: Principal | ICD-10-CM

## 2024-02-23 DIAGNOSIS — I43 Cardiomyopathy in diseases classified elsewhere: Principal | ICD-10-CM

## 2024-02-23 DIAGNOSIS — J9611 Chronic respiratory failure with hypoxia: Principal | ICD-10-CM

## 2024-02-23 DIAGNOSIS — D500 Iron deficiency anemia secondary to blood loss (chronic): Principal | ICD-10-CM

## 2024-02-23 DIAGNOSIS — F419 Anxiety disorder, unspecified: Principal | ICD-10-CM

## 2024-02-23 DIAGNOSIS — I5032 Chronic diastolic (congestive) heart failure: Principal | ICD-10-CM

## 2024-02-23 DIAGNOSIS — G4733 Obstructive sleep apnea (adult) (pediatric): Principal | ICD-10-CM

## 2024-02-23 DIAGNOSIS — E1142 Type 2 diabetes mellitus with diabetic polyneuropathy: Principal | ICD-10-CM

## 2024-02-23 DIAGNOSIS — D696 Thrombocytopenia, unspecified: Principal | ICD-10-CM

## 2024-02-23 NOTE — Unmapped (Signed)
 Called/Spoke with patient to schedule 3 month follow up. Patient asked me to called her daughter to schedule the appointment. Spoke with the daughter appointment scheduled.

## 2024-03-03 ENCOUNTER — Ambulatory Visit: Admit: 2024-03-03 | Discharge: 2024-03-04 | Payer: MEDICARE

## 2024-03-03 DIAGNOSIS — E854 Organ-limited amyloidosis: Principal | ICD-10-CM

## 2024-03-03 DIAGNOSIS — J9611 Chronic respiratory failure with hypoxia: Principal | ICD-10-CM

## 2024-03-03 DIAGNOSIS — E1142 Type 2 diabetes mellitus with diabetic polyneuropathy: Principal | ICD-10-CM

## 2024-03-03 DIAGNOSIS — R262 Difficulty in walking, not elsewhere classified: Principal | ICD-10-CM

## 2024-03-03 DIAGNOSIS — M47817 Spondylosis without myelopathy or radiculopathy, lumbosacral region: Principal | ICD-10-CM

## 2024-03-03 DIAGNOSIS — Z9981 Dependence on supplemental oxygen: Principal | ICD-10-CM

## 2024-03-03 DIAGNOSIS — N3020 Other chronic cystitis without hematuria: Principal | ICD-10-CM

## 2024-03-03 DIAGNOSIS — M48061 Spinal stenosis, lumbar region without neurogenic claudication: Principal | ICD-10-CM

## 2024-03-03 DIAGNOSIS — G4733 Obstructive sleep apnea (adult) (pediatric): Principal | ICD-10-CM

## 2024-03-03 DIAGNOSIS — N184 Chronic kidney disease, stage 4 (severe): Principal | ICD-10-CM

## 2024-03-03 DIAGNOSIS — G8929 Other chronic pain: Principal | ICD-10-CM

## 2024-03-03 DIAGNOSIS — D696 Thrombocytopenia, unspecified: Principal | ICD-10-CM

## 2024-03-03 DIAGNOSIS — E1122 Type 2 diabetes mellitus with diabetic chronic kidney disease: Principal | ICD-10-CM

## 2024-03-03 DIAGNOSIS — M810 Age-related osteoporosis without current pathological fracture: Principal | ICD-10-CM

## 2024-03-03 DIAGNOSIS — U099 Post covid-19 condition, unspecified: Principal | ICD-10-CM

## 2024-03-03 DIAGNOSIS — R5383 Other fatigue: Principal | ICD-10-CM

## 2024-03-03 DIAGNOSIS — D631 Anemia in chronic kidney disease: Principal | ICD-10-CM

## 2024-03-03 DIAGNOSIS — J449 Chronic obstructive pulmonary disease, unspecified: Principal | ICD-10-CM

## 2024-03-03 DIAGNOSIS — I10 Essential (primary) hypertension: Principal | ICD-10-CM

## 2024-03-03 DIAGNOSIS — D500 Iron deficiency anemia secondary to blood loss (chronic): Principal | ICD-10-CM

## 2024-03-03 DIAGNOSIS — I5032 Chronic diastolic (congestive) heart failure: Principal | ICD-10-CM

## 2024-03-03 DIAGNOSIS — F419 Anxiety disorder, unspecified: Principal | ICD-10-CM

## 2024-03-03 DIAGNOSIS — I272 Pulmonary hypertension, unspecified: Principal | ICD-10-CM

## 2024-03-03 DIAGNOSIS — I4819 Other persistent atrial fibrillation: Principal | ICD-10-CM

## 2024-03-03 DIAGNOSIS — I43 Cardiomyopathy in diseases classified elsewhere: Principal | ICD-10-CM

## 2024-03-03 LAB — BASIC METABOLIC PANEL
ANION GAP: 15 mmol/L — ABNORMAL HIGH (ref 5–14)
BLOOD UREA NITROGEN: 48 mg/dL — ABNORMAL HIGH (ref 9–23)
BUN / CREAT RATIO: 32
CALCIUM: 9.5 mg/dL (ref 8.7–10.4)
CHLORIDE: 99 mmol/L (ref 98–107)
CO2: 26.1 mmol/L (ref 20.0–31.0)
CREATININE: 1.52 mg/dL — ABNORMAL HIGH (ref 0.55–1.02)
EGFR CKD-EPI (2021) FEMALE: 34 mL/min/{1.73_m2} — ABNORMAL LOW (ref >=60)
GLUCOSE RANDOM: 109 mg/dL (ref 70–179)
POTASSIUM: 3.6 mmol/L (ref 3.4–4.8)
SODIUM: 140 mmol/L (ref 135–145)

## 2024-03-03 LAB — HEMOGLOBIN A1C
ESTIMATED AVERAGE GLUCOSE: 91 mg/dL
HEMOGLOBIN A1C: 4.8 % (ref 4.8–5.6)

## 2024-03-06 LAB — VITAMIN D 25 HYDROXY: VITAMIN D, TOTAL (25OH): 60.9 ng/mL (ref 20.0–80.0)

## 2024-03-07 DIAGNOSIS — N184 Chronic kidney disease, stage 4 (severe): Principal | ICD-10-CM

## 2024-03-07 DIAGNOSIS — I10 Essential (primary) hypertension: Principal | ICD-10-CM

## 2024-03-07 DIAGNOSIS — D696 Thrombocytopenia, unspecified: Principal | ICD-10-CM

## 2024-03-07 DIAGNOSIS — E1142 Type 2 diabetes mellitus with diabetic polyneuropathy: Principal | ICD-10-CM

## 2024-03-07 DIAGNOSIS — G4733 Obstructive sleep apnea (adult) (pediatric): Principal | ICD-10-CM

## 2024-03-07 DIAGNOSIS — N3020 Other chronic cystitis without hematuria: Principal | ICD-10-CM

## 2024-03-07 DIAGNOSIS — J449 Chronic obstructive pulmonary disease, unspecified: Principal | ICD-10-CM

## 2024-03-07 DIAGNOSIS — I272 Pulmonary hypertension, unspecified: Principal | ICD-10-CM

## 2024-03-07 DIAGNOSIS — G8929 Other chronic pain: Principal | ICD-10-CM

## 2024-03-07 DIAGNOSIS — I43 Cardiomyopathy in diseases classified elsewhere: Principal | ICD-10-CM

## 2024-03-07 DIAGNOSIS — D631 Anemia in chronic kidney disease: Principal | ICD-10-CM

## 2024-03-07 DIAGNOSIS — Z9981 Dependence on supplemental oxygen: Principal | ICD-10-CM

## 2024-03-07 DIAGNOSIS — R262 Difficulty in walking, not elsewhere classified: Principal | ICD-10-CM

## 2024-03-07 DIAGNOSIS — D500 Iron deficiency anemia secondary to blood loss (chronic): Principal | ICD-10-CM

## 2024-03-07 DIAGNOSIS — U099 Post covid-19 condition, unspecified: Principal | ICD-10-CM

## 2024-03-07 DIAGNOSIS — R5383 Other fatigue: Principal | ICD-10-CM

## 2024-03-07 DIAGNOSIS — M47817 Spondylosis without myelopathy or radiculopathy, lumbosacral region: Principal | ICD-10-CM

## 2024-03-07 DIAGNOSIS — M810 Age-related osteoporosis without current pathological fracture: Principal | ICD-10-CM

## 2024-03-07 DIAGNOSIS — E1122 Type 2 diabetes mellitus with diabetic chronic kidney disease: Principal | ICD-10-CM

## 2024-03-07 DIAGNOSIS — E854 Organ-limited amyloidosis: Principal | ICD-10-CM

## 2024-03-07 DIAGNOSIS — I5032 Chronic diastolic (congestive) heart failure: Principal | ICD-10-CM

## 2024-03-07 DIAGNOSIS — M48061 Spinal stenosis, lumbar region without neurogenic claudication: Principal | ICD-10-CM

## 2024-03-07 DIAGNOSIS — J9611 Chronic respiratory failure with hypoxia: Principal | ICD-10-CM

## 2024-03-07 DIAGNOSIS — I4819 Other persistent atrial fibrillation: Principal | ICD-10-CM

## 2024-03-07 DIAGNOSIS — F419 Anxiety disorder, unspecified: Principal | ICD-10-CM

## 2024-03-07 NOTE — Unmapped (Signed)
 [name] Refill Coordination Note    Specialty Medication(s) to be Shipped:   Cardiology: Vyndamax    Other medication(s) to be shipped: No additional medications requested for fill at this time     Barbara Huber, DOB: 11/15/1941  Phone: [phone] (home)       All above HIPAA information was verified with patient's family member, daughter.     Was a Nurse, learning disability used for this call? No    Completed refill call assessment today to schedule patient's medication shipment from the [facility]  [phone]).  All relevant notes have been reviewed.     Specialty medication(s) and dose(s) confirmed: Regimen is correct and unchanged.   Changes to medications: [name] reports no changes at this time.  Changes to insurance: No  New side effects reported not previously addressed with a pharmacist or physician: None reported  Questions for the pharmacist: No    Confirmed patient received a Conservation officer, historic buildings and a Surveyor, mining with first shipment. The patient will receive a drug information handout for each medication shipped and additional FDA Medication Guides as required.       DISEASE/MEDICATION-SPECIFIC INFORMATION        N/A    SPECIALTY MEDICATION ADHERENCE     Medication Adherence    Patient reported X missed doses in the last month: 0  Specialty Medication: Vyndamax 61mg   Informant: child/children              Were doses missed due to medication being on hold? No    Vyndamax 61 mg: 10 days of medicine on hand       REFERRAL TO PHARMACIST     Referral to the pharmacist: Not needed      [facility]     Shipping address confirmed in Epic.     Cost and Payment: Patient has a $0 copay, payment information is not required.    Delivery Scheduled: Yes, Expected medication delivery date: 03/10/24.     Medication will be delivered via Same Day Courier to the prescription address in Epic WAM.    Camillo Flaming, PharmD   [facility]  Specialty Pharmacist

## 2024-03-08 ENCOUNTER — Encounter: Admit: 2024-03-08 | Payer: MEDICARE

## 2024-03-08 ENCOUNTER — Encounter: Admit: 2024-03-08

## 2024-03-08 ENCOUNTER — Encounter: Admit: 2024-03-08 | Discharge: 2024-04-06 | Payer: Medicare (Managed Care)

## 2024-03-09 MED ORDER — ACCU-CHEK GUIDE TEST STRIPS
ORAL_STRIP | 0 refills | 0.00 days | Status: CP
Start: 2024-03-09 — End: ?

## 2024-03-10 MED FILL — VYNDAMAX 61 MG CAPSULE: ORAL | 30 days supply | Qty: 30 | Fill #3

## 2024-03-13 ENCOUNTER — Ambulatory Visit: Admit: 2024-03-13 | Discharge: 2024-03-14 | Payer: MEDICARE | Attending: Adult Health | Primary: Adult Health

## 2024-03-13 DIAGNOSIS — R000 Tachycardia, unspecified: Principal | ICD-10-CM

## 2024-03-13 DIAGNOSIS — E854 Organ-limited amyloidosis: Principal | ICD-10-CM

## 2024-03-13 DIAGNOSIS — E852 Heredofamilial amyloidosis, unspecified: Principal | ICD-10-CM

## 2024-03-13 DIAGNOSIS — I5032 Chronic diastolic (congestive) heart failure: Principal | ICD-10-CM

## 2024-03-13 DIAGNOSIS — I43 Cardiomyopathy in diseases classified elsewhere: Principal | ICD-10-CM

## 2024-03-13 DIAGNOSIS — N390 Urinary tract infection, site not specified: Principal | ICD-10-CM

## 2024-03-13 DIAGNOSIS — I4819 Other persistent atrial fibrillation: Principal | ICD-10-CM

## 2024-03-13 DIAGNOSIS — N184 Chronic kidney disease, stage 4 (severe): Principal | ICD-10-CM

## 2024-03-13 LAB — COMPREHENSIVE METABOLIC PANEL
ALBUMIN: 2.8 g/dL — ABNORMAL LOW (ref 3.4–5.0)
ALKALINE PHOSPHATASE: 194 U/L — ABNORMAL HIGH (ref 46–116)
ALT (SGPT): 7 U/L — ABNORMAL LOW (ref 10–49)
ANION GAP: 13 mmol/L (ref 5–14)
AST (SGOT): 23 U/L (ref <=34)
BILIRUBIN TOTAL: 0.5 mg/dL (ref 0.3–1.2)
BLOOD UREA NITROGEN: 49 mg/dL — ABNORMAL HIGH (ref 9–23)
BUN / CREAT RATIO: 25
CALCIUM: 9.3 mg/dL (ref 8.7–10.4)
CHLORIDE: 98 mmol/L (ref 98–107)
CO2: 25.9 mmol/L (ref 20.0–31.0)
CREATININE: 1.97 mg/dL — ABNORMAL HIGH (ref 0.55–1.02)
EGFR CKD-EPI (2021) FEMALE: 25 mL/min/{1.73_m2} — ABNORMAL LOW (ref >=60)
GLUCOSE RANDOM: 126 mg/dL (ref 70–179)
POTASSIUM: 3 mmol/L — ABNORMAL LOW (ref 3.4–4.8)
PROTEIN TOTAL: 6.8 g/dL (ref 5.7–8.2)
SODIUM: 137 mmol/L (ref 135–145)

## 2024-03-13 LAB — CBC
HEMATOCRIT: 39.2 % (ref 34.0–44.0)
HEMOGLOBIN: 12.5 g/dL (ref 11.3–14.9)
MEAN CORPUSCULAR HEMOGLOBIN CONC: 32 g/dL (ref 32.0–36.0)
MEAN CORPUSCULAR HEMOGLOBIN: 29.3 pg (ref 25.9–32.4)
MEAN CORPUSCULAR VOLUME: 91.6 fL (ref 77.6–95.7)
MEAN PLATELET VOLUME: 8.8 fL (ref 6.8–10.7)
PLATELET COUNT: 223 10*9/L (ref 150–450)
RED BLOOD CELL COUNT: 4.28 10*12/L (ref 3.95–5.13)
RED CELL DISTRIBUTION WIDTH: 14.5 % (ref 12.2–15.2)
WBC ADJUSTED: 10 10*9/L (ref 3.6–11.2)

## 2024-03-13 LAB — MAGNESIUM: MAGNESIUM: 2.1 mg/dL (ref 1.6–2.6)

## 2024-03-13 LAB — PRO-BNP: PRO-BNP: 15373 pg/mL — ABNORMAL HIGH (ref <=300.0)

## 2024-03-13 LAB — T4, FREE: FREE T4: 2.34 ng/dL — ABNORMAL HIGH (ref 0.89–1.76)

## 2024-03-13 LAB — TSH: THYROID STIMULATING HORMONE: 0.032 u[IU]/mL — ABNORMAL LOW (ref 0.550–4.780)

## 2024-03-13 MED ORDER — POTASSIUM CHLORIDE ER 20 MEQ TABLET,EXTENDED RELEASE(PART/CRYST)
ORAL_TABLET | Freq: Every day | ORAL | 3 refills | 90.00 days | Status: CP
Start: 2024-03-13 — End: 2025-03-13

## 2024-03-13 MED ORDER — FOSFOMYCIN TROMETHAMINE 3 GRAM ORAL PACKET
PACK | ORAL | 0 refills | 0.00 days
Start: 2024-03-13 — End: ?

## 2024-03-13 NOTE — Unmapped (Addendum)
 Today,    MEDICATIONS:  We are changing your medications today.  - CONTINUE torsemide  80mg  a day. If you lose more weight, reduce to 60mg .  - we will schedule cardioversion - dont miss any Eliquis  Call if you have questions about your medications.    LABS:  We will call you if your labs need attention.    NEXT APPOINTMENT:  Return to clinic in 6 weeks with me      In general, to take care of your heart failure:  -Limit your fluid intake to 2 Liters (half-gallon) per day.    -Limit your salt intake to ideally 2-3 grams (2000-3000 mg) per day.  -Weigh yourself daily and record, and bring that weight diary to your next appointment.  (Weight gain of 2-3 pounds in 1 day typically means fluid weight.)    The medications for your heart are to help your heart and help you live longer.    Please contact us before stopping any of your heart medications.    Call the clinic at [phone] with questions.  Our clinic fax number is [phone].  If you need to reschedule future appointments, please call [phone] or [phone]  My office number (c/o De Burrs RN) is [phone] if you need further assistance.    Please do not send a MyChart message for potentially life-threatening symptoms.  Please call 911 for a true medical emergency.    To learn more about heart failure, please read [facility] Learning to Live with Heart Failure - Available online at:  [email or web address] - open the window for Medical Management and click the link Living with Heart Failure   (Can search [facility] heart failure on the web to find the link.)

## 2024-03-13 NOTE — Unmapped (Signed)
 [facility] Note    Referring Provider: Artelia Laroche, MD  [phone] [facility] [address] [place],  [place] [address]   Primary Provider: Artelia Laroche, MD  [address]  [place] [address]   Other Providers:  Dr Barbara Huber, Dr Barbara Huber    Reason for Visit:  Barbara Huber is a 83 y.o. female being seen for routine visit and continued care of cardiac amyloidosis .    Assessment & Plan:  1. 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 SPEP/UPEP/free light chains without concern. 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 Daughter is informed and has seen cardiology Barbara Huber).   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 2nd injection July 2024, 3rd in November 2024, 4th in January 2025. Continue vitamin A supplementation.   - DC'd Jardiance 10mg  daily due to recurrent UTIs  - Weight at home has been <180, around 178, after recent GI illness. Continue torsemide 80mg  daily, reduce to 60mg  daily for additional weight loss  - metolazone 5mg  PRN   - spironolactone 12.5mg  daily  - Potassium is 3.0 today; start KCl daily  - Taking midodrine 5mg  TID with BP in 90-110s/60-70s at home. Tried stopping PM dose for concern it was contributing to insomnia, but no improvement in sleep. Also tried stopping midday dose for concern it was causing dry mouth, but then BP dropped low. Continue to monitor BP and can increase to 10mg  for SBP <90   - Continue Tafamidis 61mg  daily. Consider switch to acoramidis for worsening symptoms.   - She has not been interested in CardioMEMS  1b. Goals of Care  - Had a discussion at prior visit w/ pt and daughter Barbara Huber regarding pt's goals of care.   We discussed role of palliative care (has seen Dr Garner Nash) and hospice (when appropriate). She may wish to return to hospital if needed, but likely not ICU level care or invasive monitoring (such as swan)    2. Persistent atrial fibrillation  - CHA2DS2-VASc score = 5 (1-gender, 2-Age, 1-DM, 1-HTN)  - We had previously stopped Eliquis given recurrent anemia, shared decision making w/ pt and daughter. She was not interested in [place] at the time.   - Eliquis 2.5mg  BID restarted during hospitalization - will closely follow hemoglobin in follow up. Hemoglobin has been stable.   - s/p DCCV on 11/17/23  - On amiodarone 200mg  daily - unfortunately back in AF today after recent GI illness. Plan for DCCV.   - For recurrent AF, she wouldn't want invasive procedures (like PPM and AVN ablation)  - amiodarone monitoring - last PFTs in 04/2021; TSH today is low,  free T4 a bit elevated. Will discuss w/ pt's PCP. LFTs stable    3. [name]  - Cr baseline around 2.3-3.0  - Peaked at 5.4 during admission, 2.93 on discharge  - today Cr 1.97    4. COPD, OSA  - home O2 3-4L [place] - followed by pulm  - on Advair and Spiriva  - Uses trilogy at night     5. Iron  deficiency anemia  - As above, follow hemoglobin while on Eliquis   - taking Iron MWF  Lab Results   Component Value Date    FERRITIN 336.3 (H) 08/19/2023     Lab Results   Component Value Date    HGB 12.5 03/13/2024     6. Anxiety  - previously on Zoloft  - Tried Remeron, but discontinued in setting of worsening HF symptoms    7. DM  - Off Insulin with A1c <7  - Followed by PCP   Lab Results   Component Value Date    A1C 4.8 03/03/2024     8. Chronic back pain  - follows with pain clinic, on oxycodone  - gabapentin BID PRN   - Continue flexeril 5mg  PRN nightly, which she tolerated previously during last flare up of her back pain (feb-May 2024)    Follow-up:  Return in about 6 weeks (around 04/24/2024) for already scheduled appointment.     History of Present Illness:  Barbara Huber is a 83 y.o. female with PMHx of COPD on 3L home [place], CHF, [name], [name], [name] She was initially admitted with decompensated [name] in 2021 in setting of  Septic Shock d/t Gangrenous Cholecystitis s/p cholecystectomy. She underwent further evaluation with PYP scan which showed TTR amyloid. She has since been followed in clinic for management of her [name]. In 2022, had numerous admissions for weakness/urosepsis, shortness of breath/orthopnea/weight gain, lightheadedness/SOB/Blurry vision found to be anemic.    Summary of most recent visits:  - 01/16/22 [facility]): Feeling well from a cardiac standpoint. Started Zoloft for anxiety  - 02/13/22 [facility]): Weight 210-211. Feeling well. Taking extra torsemide 20mg  about every other day, which was stopped for elevated Cr.   - 03/13/22 ([name]): Weight 201-216, took metolazone PRN, weight stabilized at 210lbs. Considering cardioMEMS.   - 05/13/22 ([name]): Had 3 iron infusions. Doing well w/ volume, weight 211-212lbs. Cooking, staying active around house.   - 05/21/22 ([name]): CP/SOB. Weight 217 from 213. She took metolazone x1 dose.   - 05/22/22 ([name]): Ed follow up. Back down to 213lbs. Still having CP w/ certain movement.   - 06/05/22 ([name]): Weight 214-216lb, should be 212lb.   - 06/09/22, 06/11/22, 06/22/22 ([name]): Weight down from 217 to 213lb, 211lb at home. Feeling well by 6/26.   - 07/15/22 ([name]): Switched inhaler back to Advair/Spiriva with improvement in breathing and O2 sat. Weight 207lbs, feeling more like herself.   - 08/19/22 ([name]): Feeling well. Weight 208-210lbs.   - 10/22/22 ([name]): Feeling well. Weight has been 210-214lbs.   - 12/04/22 ([name]): doing well overall, weight a bit up so taking extra torsemide.   - 02/10/23 ([name]): Weight 213-214lbs, feeling well, eating a lot. Had taken metolazone earlier in the week.   - 02/23/23 [facility]): Hospital follow up after mechanical fall with nondisplaced fractures of the left superior and inferior pubic rami. Taking torsemide 40mg , weight 211.   - 04/14/23 ([name]): Weight 210-212. 1st Amvuttra  - 07/14/23 [facility]): Weight 211-212 on torsemide 60mg  daily. 2nd Amvuttra.   - 08/18/23-08/23/23 (Admission): fatigue, weakness, DOE, recent prednisone course for gout. Admitted to [facility], poor response to IV Lasix and was changed to IV Bumex 3mg  BID and metolazone. [name] held for low BP. Cr increased to 4.0 with diuresis. She went back to the ER on 8/24 for abdominal pain. She was felt to be euvolemic at a weight of 207lb and IV diuretics were held. She  was discharged on torsemide 60mg  daily on 8/26.   - 08/27/23 [facility]): hospital follow up. Felt to be dry for weight down to 204 at home, Cr 3.4 BUN 96. Changed torsemide from 60 daily to 40/20.   - 08/30/23-09/02/23 (Admission): Admitted for fatigue and SOB. Diuresed  with improvement in dyspnea. Weight on discharge was 204.   - 09/06/23 [facility]): Hospital follow up. Her weight continued to decrease on her scale by about 1lb per day. Weight 201 that morning. She was a little lightheaded and felt weak. Continued on torsemide 60mg  daily.  Cr 2.61, pro-BNP 4575  - 09/08/23 ([name]/Phone): Weight 201, breathing well.   - 09/16/23 ([facility]): Weight 205, not peeing much with torsemide 60mg . Feeling full in belly. Cr 3.11,  pro-BNP 14492. Metolazone and extra 40 torsemide.   - 09/17/23 ([facility]): Weight 203, feeling better. Continue torsemide 60/40 through weekend.   - 09/23/23 ([name]): n 9/25 reporting hat she didn't pee much the night before and weight was 203lb that morning. Increased by 2 lbs overnight. Instructed to take metolazone 2.5mg  and extra 40 torsemide in the afternoon. Today her weight was up and so she requested a visit. She didn't urinate much and this morning was 205lbs. Extra torsemide 60mg  then metolazone in morning and return for diuresis clinic.   - 09/24/23 (Diuresis): IV Lasix. Arranged for direct admission.   - 9/27-10/5 (Admission): She initially underwent inotrope assisted diuresis with dopamine and furosemide gtt, which was stopped on 9/30 after episodes of hypotension requiring NE and midodrine. Norepinephrine stopped and pulmonary wedge pressure was 12 prior to pulling swan on on 10/1. Midodrine maintained to maximize preload. Furosemide gtt transitioned to spot IV lasix and then was transitioned to home torsemide 60mg  daily on 10/2. Palliative care was consulted and family decided on outpatient evaluation. She was discharged with a pro-BNP of 17K down from 26K and weight of 209lb. Discharged with new home regimen torsemide 60mg  daily, metolazone prn per clinic, midodrine 5mg  TID, Tafamidis 61mg  daily. She was continued on chronic home amiodarone 200 mg daily for atrial fibrillation and restarted on Eliquis 2.5mg  BID.   - 10/9/242 [facility]): hospital follow up. Weight 193-195. Torsemide 60mg g daily. In AF RVR by EKG. Amio increased to 200mg  BID.   - 10/27/23 ([name]): Weight up to 201 but had been 196-197.  - 11/11/23 [facility]): Hospital follow up, [name] for hypoxia 2/2 malfunction of portable O2. Weight 194 on torsemide 60mg .  - 11/17/23 (EP Procedure): Cardioversion  - 12/15/23 ([name]): Weight 186-189 on torsemide 60mg . Pro-BNP up. Increased torsemide to 80mg .  - 01/26/24 ([name]): Weight as low as 181 on torsemide 80mg . No med changes.     Interval history  Today she presents for follow up. Pt's daughter requested appointment due to suspecting the pt was back in afib. She came down with a GI illness around 3/10, with diarrhea and nausea. She was not eating much. Pt's daughter has noted weight loss down to 178lbs. They reduced torsemide by 20mg  on Saturday because she had peed so much. She's lost about 5lbs since being sick. Last week, her HR was elevated and pt's daughter suspect afib, but then all last week the heart rate was back to normal. This  past Saturday, her HR was back up again in 80s-90s and daughter again suspected she went back into afib. This time, the HR is staying elevated since this past weekend. She's noticed a little heavier breathing as well. Pt denies DOE and LE edema. Her appetite remains  poor. She's been taking Eliquis.     Cardiovascular History & Procedures:    Cath / PCI:  none    CV Surgery:   none    EP Procedures and Devices:  11/17/23 - DCCV  01/10/21 - DCCV    Non-Invasive Evaluation(s):    Echo:  08/19/23  1. The left ventricle is normal in size with moderately increased wall  thickness.    2. 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    3. The left ventricular systolic function is hyperdynamic, LVEF is visually  estimated at >70%.    4. Mitral annular calcification is present (moderate).    5. The mitral valve leaflets are mildly thickened with normal leaflet  mobility.    6. The aortic valve is trileaflet with mildly thickened leaflets with normal  excursion.    7. The left atrium is moderately dilated in size.    8. The right ventricle is normal in size, with normal systolic function.    02/17/23    1. The left ventricle is normal in size with moderately increased wall  thickness.    2. The left ventricular systolic function is hyperdynamic, LVEF is visually  estimated at >70%.    3. The left atrium is moderately dilated in size.    4. The right ventricle is normal in size, with normal systolic function.    5. There is mild pulmonary hypertension.    12/09/21  Summary    1. The left ventricle is normal in size with severely increased wall  thickness.    2. The left ventricular systolic function is hyperdynamic, LVEF is visually  estimated at >70%.    3. There is grade III diastolic dysfunction (severely elevated filling  pressure).    4. The left atrium is moderately to severely dilated in size.    5. The right ventricle is mildly dilated in size, with normal systolic  function.    6. The right atrium is mildly dilated  in size. 7. IVC size and inspiratory change suggest elevated right atrial pressure.  (10-20 mmHg).    04/24/21   1. The left ventricle is normal in size with mildly increased wall  thickness.    2. The left ventricular systolic function is normal, LVEF is visually  estimated at > 55%.    3. There is grade II diastolic dysfunction (elevated filling pressure).    4. Mitral annular calcification is present (mild).    5. The left atrium is mildly dilated in size.    6. The right ventricle is mildly dilated in size, with low normal systolic  function.    7. IVC size and inspiratory change suggest elevated right atrial pressure.  (10-20 mmHg).    09/30/20  Summary    1. Limited study to assess systolic function.    2. IVC size and inspiratory change suggest normal right atrial pressure.  (0-5 mmHg).    3. The left ventricle is normal in size with mildly to moderately increased  wall thickness.    4. The left ventricular systolic function is normal with no obvious wall  motion abnormalities, LVEF is visually estimated at > 55%.    5. Mitral annular calcification is present.    6. The left atrium is mildly dilated in size.    7. The right ventricle is upper normal in size, with reduced systolic  Function.    Cardiac CT/MRI/Nuclear Tests:   None    6 Minute  Walk:  None    Cardiopulmonary Stress Tests:   None               Other Past Medical History:  See below for the complete EPIC list of past medical and surgical history.      Allergies:  Nitrofurantoin and Lipitor [atorvastatin]    Current Medications:  Current Outpatient Medications   Medication Sig Dispense Refill    acetaminophen (TYLENOL) 500 MG tablet Take 2 tablets (1,000 mg total) by mouth Three (3) times a day as needed for pain.      amiodarone (PACERONE) 200 MG tablet Take 1 tablet (200 mg total) by mouth daily. 30 tablet 11    apixaban (ELIQUIS) 2.5 mg Tab Take 1 tablet (2.5 mg total) by mouth two (2) times a day. 180 tablet 3    cranberry 500 mg cap Take 500 mg by mouth daily with evening meal.      cyclobenzaprine (FLEXERIL) 5 MG tablet TAKE 1 AND 1/2 TABLETS BY MOUTH TWICE DAILY AS NEEDED FOR MUSCLE SPASMS 90 tablet 0    ergocalciferol-1,250 mcg, 50,000 unit, (DRISDOL) 1,250 mcg (50,000 unit) capsule Take 1 capsule (1,250 mcg total) by mouth once a week. 12 capsule 3    estradioL (ESTRACE) 0.01 % (0.1 mg/gram) vaginal cream Insert 2 g into the vagina Two (2) times a week. 42.5 g 3    ferrous sulfate 325 (65 FE) MG EC tablet Take 1 tablet (325 mg total) by mouth Every Monday, Wednesday, and Friday. 36 tablet 3    fluticasone propion-salmeterol (ADVAIR HFA) 115-21 mcg/actuation inhaler Inhale 2 puffs two (2) times a day. 12 g 11    fosfomycin (MONUROL) 3 gram Pack Take 3 g by mouth once a week. 4 packet 2    gabapentin (NEURONTIN) 100 MG capsule Take 1 capsule (100 mg total) by mouth two (2) times a day. 60 capsule 11    lidocaine (ASPERCREME) 4 % patch Place 1 patch on the skin daily. Remove after 12 hours. 30 patch 2    midodrine (PROAMATINE) 5 MG tablet Take 1 tablet (5 mg total) by mouth three (3) times a day. 90 tablet 11    montelukast (SINGULAIR) 10 mg tablet Take 1 tablet (10 mg total) by mouth every morning. 90 tablet 2    oxyCODONE (ROXICODONE) 5 MG immediate release tablet Take 1 tablet (5 mg total) by mouth every eight (8) hours as needed for pain.      OXYGEN-AIR DELIVERY SYSTEMS MISC 5 L by Miscellaneous route. Currently using 3  L/min via [place]      pantoprazole (PROTONIX) 20 MG tablet TAKE 1 TABLET(20 MG) BY MOUTH DAILY 90 tablet 3    rosuvastatin (CRESTOR) 5 MG tablet TAKE 1 TABLET EVERY OTHER DAY 45 tablet 3    spironolactone (ALDACTONE) 25 MG tablet Take 0.5 tablets (12.5 mg total) by mouth daily. 30 tablet 2    tafamidis (VYNDAMAX) 61 mg cap Take 1 capsule (61 mg) by mouth daily. 30 capsule 11    tiotropium bromide (SPIRIVA RESPIMAT) 2.5 mcg/actuation inhalation mist Inhale 2 puffs daily. 4 g 11    torsemide (DEMADEX) 20 MG tablet Take 4 tablets (80 mg total) by mouth daily. 360 tablet 3    traZODone (DESYREL) 50 MG tablet Take 1 tablet (50 mg total) by mouth nightly as needed for sleep. 30 tablet 3    vitamin A-3,000 mcg RAE, 10,000 UNIT, 3,000 mcg RAE (10,000 UNIT) capsule Take 1 capsule (3,000 mcg of  RAE total) by mouth daily.      vutrisiran (AMVUTTRA) 25 mg/0.5 mL injection Inject 0.5 mL (25 mg total) under the skin once. Every 12 Weeks      ACCU-CHEK GUIDE TEST STRIPS Strp USE TO CHECK BLOOD SUGAR DAILY 50 strip 0    albuterol HFA 90 mcg/actuation inhaler Inhale 2 puffs every eight (8) hours as needed for wheezing. (Patient not taking: Reported on 03/13/2024)      lancets (ACCU-CHEK SOFTCLIX LANCETS) Misc CHECK BLOOD SUGAR DAILY 200 each 11    metOLazone (ZAROXOLYN) 5 MG tablet Take 1 tablet (5 mg total) by mouth daily as needed (up to 3x weekly when instructed by cardiology clinic).      NARCAN 4 mg/actuation nasal spray 1 spray into alternating nostrils once as needed (opioid overdose). PRN - Emergency use.      potassium chloride 20 MEQ ER tablet Take 1 tablet (20 mEq total) by mouth daily. 90 tablet 3     No current facility-administered medications for this visit.       Family History:  The patient's family history includes Cancer in her father; Hypertension in her mother; No Known Problems in her brother, brother, maternal aunt, maternal grandfather, maternal grandmother, maternal uncle, paternal aunt, paternal grandfather, paternal grandmother, paternal uncle, sister, sister, and another family member.    Social history:  She  reports that she quit smoking about 6 years ago. Her smoking use included cigarettes. She has never used smokeless tobacco. She reports that she does not drink alcohol and does not use drugs.    Review of Systems:  As per HPI.  Rest of the review of ten systems is negative or unremarkable except as stated above.    Physical Exam:  VITAL SIGNS:   Vitals:    03/13/24 1318   BP: 129/68   Pulse: 98   SpO2: 99%     Wt Readings from Last 3 Encounters:   03/13/24 83.3 kg (183 lb 11.2 oz)   02/21/24 81.6 kg (180 lb)   01/26/24 84.6 kg (186 lb 9.6 oz)      Today's Body mass index is 28.77 kg/m??.   Height: 170.2 cm (5' 7)  CONSTITUTIONAL: well-appearing in no acute distress  EYES: Conjunctivae and sclerae clear and anicteric.  ENT: Benign.   CARDIOVASCULAR: JVP  not visible up neck with HOB at 90degrees. Rate and rhythm are irregularly irregular.  There is no lifts or heaves.  Normal S1, S2. There is no murmur, gallops or rubs.  Radial and pedal pulses are 2+, bilaterally.   There is trace edema to ankles, bilaterally.   RESPIRATORY: Decreased breath sounds at bases.  There are no wheezes.  GASTROINTESTINAL: Soft, non-tender, with audible bowel sounds. Abdomen nondistended.  Liver is nonpalpable.  SKIN: No rashes, ecchymosis or petechiae.  Warm, well perfused.   MUSCULOSKELETAL:  no joint swelling   NEURO/PSYCH: Appropriate mood and affect. Alert and oriented to person, place, and time. No gross motor or sensory deficits evident.    Pertinent Laboratory Studies:   Office Visit on 03/13/2024   Component Date Value Ref Range Status    EKG Ventricular Rate 03/13/2024 94  BPM Preliminary    EKG QRS Duration 03/13/2024 132  ms Preliminary    EKG Q-T Interval 03/13/2024 426  ms Preliminary    EKG QTC Calculation 03/13/2024 532  ms Preliminary    EKG Calculated R Axis 03/13/2024 -85  degrees Preliminary    EKG Calculated T Axis 03/13/2024 75  degrees Preliminary    QTC Fredericia 03/13/2024 494  ms Preliminary    Sodium 03/13/2024 137  135 - 145 mmol/L Final    Potassium 03/13/2024 3.0 (L)  3.4 - 4.8 mmol/L Final    Chloride 03/13/2024 98  98 - 107 mmol/L Final    CO2 03/13/2024 25.9  20.0 - 31.0 mmol/L Final    Anion Gap 03/13/2024 13  5 - 14 mmol/L Final    BUN 03/13/2024 49 (H)  9 - 23 mg/dL Final    Creatinine 16/09/9603 1.97 (H)  0.55 - 1.02 mg/dL Final    BUN/Creatinine Ratio 03/13/2024 25   Final    eGFR [name]-EPI (2021) Female 03/13/2024 25 (L)  >=60 mL/min/1.56m2 Final    Glucose 03/13/2024 126  70 - 179 mg/dL Final    Calcium 54/08/8118 9.3  8.7 - 10.4 mg/dL Final    Albumin 14/78/2956 2.8 (L)  3.4 - 5.0 g/dL Final    Total Protein 03/13/2024 6.8  5.7 - 8.2 g/dL Final    Total Bilirubin 03/13/2024 0.5  0.3 - 1.2 mg/dL Final    AST 21/30/8657 23  <=34 U/L Final    ALT 03/13/2024 7 (L)  10 - 49 U/L Final    Alkaline Phosphatase 03/13/2024 194 (H)  46 - 116 U/L Final    Magnesium 03/13/2024 2.1  1.6 - 2.6 mg/dL Final    PRO-BNP 84/69/6295 15,373.0 (H)  <=300.0 pg/mL Final    WBC 03/13/2024 10.0  3.6 - 11.2 10*9/L Final    RBC 03/13/2024 4.28  3.95 - 5.13 10*12/L Final    HGB 03/13/2024 12.5  11.3 - 14.9 g/dL Final    HCT 28/41/3244 39.2  34.0 - 44.0 % Final    MCV 03/13/2024 91.6  77.6 - 95.7 fL Final    MCH 03/13/2024 29.3  25.9 - 32.4 pg Final    MCHC 03/13/2024 32.0  32.0 - 36.0 g/dL Final    RDW 12/30/7251 14.5  12.2 - 15.2 % Final    MPV 03/13/2024 8.8  6.8 - 10.7 fL Final    Platelet 03/13/2024 223  150 - 450 10*9/L Final    TSH 03/13/2024 0.032 (L)  0.550 - 4.780 uIU/mL Final   Appointment on 03/03/2024   Component Date Value Ref Range Status    Sodium 03/03/2024 140  135 - 145 mmol/L Final    Potassium 03/03/2024 3.6  3.4 - 4.8 mmol/L Final    Chloride 03/03/2024 99  98 - 107 mmol/L Final    CO2 03/03/2024 26.1  20.0 - 31.0 mmol/L Final    Anion Gap 03/03/2024 15 (H)  5 - 14 mmol/L Final    BUN 03/03/2024 48 (H)  9 - 23 mg/dL Final    Creatinine 66/44/0347 1.52 (H)  0.55 - 1.02 mg/dL Final    BUN/Creatinine Ratio 03/03/2024 32   Final    eGFR [name]-EPI (2021) Female 03/03/2024 34 (L)  >=60 mL/min/1.72m2 Final    Glucose 03/03/2024 109  70 - 179 mg/dL Final    Calcium 42/59/5638 9.5  8.7 - 10.4 mg/dL Final    Hemoglobin V5I 03/03/2024 4.8  4.8 - 5.6 % Final    Estimated Average Glucose 03/03/2024 91  mg/dL Final    Vitamin D Total (25OH) 03/03/2024 60.9  20.0 - 80.0 ng/mL Final   Office Visit on 01/26/2024   Component Date Value Ref Range Status    Sodium 01/26/2024 138  135 - 145 mmol/L Final    Potassium 01/26/2024 4.1  3.4 - 4.8 mmol/L Final    Chloride 01/26/2024 96 (L)  98 - 107 mmol/L Final    CO2 01/26/2024 29.3  20.0 - 31.0 mmol/L Final    Anion Gap 01/26/2024 13  5 - 14 mmol/L Final    BUN 01/26/2024 64 (H)  9 - 23 mg/dL Final    Creatinine 16/09/9603 2.03 (H)  0.55 - 1.02 mg/dL Final    BUN/Creatinine Ratio 01/26/2024 32   Final    eGFR [name]-EPI (2021) Female 01/26/2024 24 (L)  >=60 mL/min/1.78m2 Final    Glucose 01/26/2024 121  70 - 179 mg/dL Final    Calcium 54/08/8118 9.8  8.7 - 10.4 mg/dL Final    Magnesium 14/78/2956 2.3  1.6 - 2.6 mg/dL Final    PRO-BNP 21/30/8657 7,489.0 (H)  <=300.0 pg/mL Final    WBC 01/26/2024 7.8  3.6 - 11.2 10*9/L Final    RBC 01/26/2024 3.61 (L)  3.95 - 5.13 10*12/L Final    HGB 01/26/2024 10.7 (L)  11.3 - 14.9 g/dL Final    HCT 84/69/6295 32.0 (L)  34.0 - 44.0 % Final    MCV 01/26/2024 88.6  77.6 - 95.7 fL Final    MCH 01/26/2024 29.6  25.9 - 32.4 pg Final    MCHC 01/26/2024 33.4  32.0 - 36.0 g/dL Final    RDW 28/41/3244 14.1  12.2 - 15.2 % Final    MPV 01/26/2024 8.6  6.8 - 10.7 fL Final    Platelet 01/26/2024 217  150 - 450 10*9/L Final   Office Visit on 12/15/2023   Component Date Value Ref Range Status    EKG Ventricular Rate 12/15/2023 76  BPM Final    EKG Atrial Rate 12/15/2023 76  BPM Final    EKG P-R Interval 12/15/2023 186  ms Final    EKG QRS Duration 12/15/2023 110  ms Final    EKG Q-T Interval 12/15/2023 440  ms Final    EKG QTC Calculation 12/15/2023 495  ms Final    EKG Calculated P Axis 12/15/2023 40  degrees Final    EKG Calculated R Axis 12/15/2023 213  degrees Final    EKG Calculated T Axis 12/15/2023 12  degrees Final    QTC Fredericia 12/15/2023 475  ms Final    PRO-BNP 12/15/2023 15,177.0 (H)  <=300.0 pg/mL Final    Sodium 12/15/2023 136  135 - 145 mmol/L Final    Potassium 12/15/2023 3.9  3.4 - 4.8 mmol/L Final    Chloride 12/15/2023 94 (L)  98 - 107 mmol/L Final    CO2 12/15/2023 28.1  20.0 - 31.0 mmol/L Final    Anion Gap 12/15/2023 14  5 - 14 mmol/L Final    BUN 12/15/2023 64 (H)  9 - 23 mg/dL Final    Creatinine 12/30/7251 2.09 (H)  0.55 - 1.02 mg/dL Final    BUN/Creatinine Ratio 12/15/2023 31   Final    eGFR [name]-EPI (2021) Female 12/15/2023 23 (L)  >=60 mL/min/1.82m2 Final    Glucose 12/15/2023 133  70 - 179 mg/dL Final    Calcium 66/44/0347 9.7  8.7 - 10.4 mg/dL Final    Magnesium 42/59/5638 1.9  1.6 - 2.6 mg/dL Final   Appointment on 12/10/2023   Component Date Value Ref Range Status    Sodium 12/10/2023 136  135 - 145 mmol/L Final    Potassium 12/10/2023 3.4  3.4 - 4.8 mmol/L Final    Chloride 12/10/2023 93 (L)  98 - 107 mmol/L Final  CO2 12/10/2023 29.8  20.0 - 31.0 mmol/L Final    Anion Gap 12/10/2023 13  5 - 14 mmol/L Final    BUN 12/10/2023 66 (H)  9 - 23 mg/dL Final    Creatinine 54/08/8118 2.16 (H)  0.55 - 1.02 mg/dL Final    BUN/Creatinine Ratio 12/10/2023 31   Final    eGFR [name]-EPI (2021) Female 12/10/2023 22 (L)  >=60 mL/min/1.8m2 Final    Glucose 12/10/2023 182 (H)  70 - 179 mg/dL Final    Calcium 14/78/2956 9.4  8.7 - 10.4 mg/dL Final    Albumin 21/30/8657 2.9 (L)  3.4 - 5.0 g/dL Final    Total Protein 12/10/2023 7.3  5.7 - 8.2 g/dL Final    Total Bilirubin 12/10/2023 0.9  0.3 - 1.2 mg/dL Final    AST 84/69/6295 30  <=34 U/L Final    ALT 12/10/2023 9 (L)  10 - 49 U/L Final    Alkaline Phosphatase 12/10/2023 274 (H)  46 - 116 U/L Final    PRO-BNP 12/10/2023 16,566.0 (H)  <=300.0 pg/mL Final    Magnesium 12/10/2023 2.0  1.6 - 2.6 mg/dL Final    WBC 28/41/3244 11.2  3.6 - 11.2 10*9/L Final    RBC 12/10/2023 3.87 (L)  3.95 - 5.13 10*12/L Final    HGB 12/10/2023 11.3  11.3 - 14.9 g/dL Final    HCT 12/30/7251 34.6  34.0 - 44.0 % Final    MCV 12/10/2023 89.4  77.6 - 95.7 fL Final    MCH 12/10/2023 29.1  25.9 - 32.4 pg Final    MCHC 12/10/2023 32.6  32.0 - 36.0 g/dL Final    RDW 66/44/0347 14.4  12.2 - 15.2 % Final    MPV 12/10/2023 8.3  6.8 - 10.7 fL Final    Platelet 12/10/2023 219  150 - 450 10*9/L Final   Admission on 11/17/2023, Discharged on 11/17/2023   Component Date Value Ref Range Status    EKG Ventricular Rate 11/17/2023 105  BPM Final    EKG QRS Duration 11/17/2023 124  ms Final    EKG Q-T Interval 11/17/2023 420  ms Final    EKG QTC Calculation 11/17/2023 555  ms Final    EKG Calculated R Axis 11/17/2023 265  degrees Final    EKG Calculated T Axis 11/17/2023 52  degrees Final    QTC Fredericia 11/17/2023 506  ms Final    EKG Ventricular Rate 11/17/2023 67  BPM Final    EKG Atrial Rate 11/17/2023 67  BPM Final    EKG P-R Interval 11/17/2023 208  ms Final    EKG QRS Duration 11/17/2023 126  ms Final    EKG Q-T Interval 11/17/2023 510  ms Final    EKG QTC Calculation 11/17/2023 538  ms Final    EKG Calculated P Axis 11/17/2023 58  degrees Final    EKG Calculated R Axis 11/17/2023 260  degrees Final    EKG Calculated T Axis 11/17/2023 11  degrees Final    QTC Fredericia 11/17/2023 529  ms Final       Lab Results   Component Value Date    PRO-BNP 15,373.0 (H) 03/13/2024    PRO-BNP 7,489.0 (H) 01/26/2024    PRO-BNP 15,177.0 (H) 12/15/2023    Creatinine 1.97 (H) 03/13/2024    Creatinine 1.52 (H) 03/03/2024    Creatinine 0.79 01/06/2011    BUN 49 (H) 03/13/2024    BUN 48 (H) 03/03/2024    BUN 20 01/06/2011  Sodium 137 03/13/2024    Sodium 140 01/06/2011    Potassium 3.0 (L) 03/13/2024    Potassium 4.1 01/06/2011    CO2 25.9 03/13/2024    CO2 30 01/06/2011    Magnesium 2.1 03/13/2024    Magnesium 2.0 01/06/2011    Total Bilirubin 0.5 03/13/2024    INR 1.04 09/24/2023    INR 1.0 01/06/2011       Lab Results   Component Value Date    Digoxin Level 0.9 10/01/2020       Lab Results   Component Value Date    TSH 0.032 (L) 03/13/2024    Cholesterol 95 12/06/2021    Triglycerides 66 12/06/2021    HDL 42 12/06/2021    Non-HDL Cholesterol 53 (L) 12/06/2021    LDL Calculated 40 12/06/2021       Lab Results   Component Value Date    WBC 10.0 03/13/2024    WBC 12.5 (H) 01/06/2011    HGB 12.5 03/13/2024    HGB 12.9 01/06/2011    HCT 39.2 03/13/2024    HCT 40.8 01/06/2011    Platelet 223 03/13/2024    Platelet 260 01/06/2011       Pertinent Test Results from Today:  None    Other pertinent records were reviewed.    The following are further history from the patient's EPIC record for reference:     Past Medical History:   Diagnosis Date    Acute exacerbation of CHF (congestive heart failure) (CMS-HCC) 08/19/2023    Acute kidney injury superimposed on chronic kidney disease (CMS-HCC) 10/11/2020    Acute on chronic diastolic (congestive) heart failure (CMS-HCC) 08/23/2020    Acute on chronic diastolic congestive heart failure (CMS-HCC) 08/23/2020    AKI (acute kidney injury) (CMS-HCC) 04/14/2015    Lab Results  Component  Value  Date     CREATININE  1.90 (H)  06/12/2021     Had a bump in her creatinine when she was taking her diuretics every day.  She is currently taking 40 mg daily of torsemide and 50 mg of spironolactone.  Her volume status is fragile.  Previously when she stopped her diuretic she becomes short of breath.  Plan: We will check her BMP today.  We will likely have to go to 40    Anemia 08/22/2019    Iron deficiency anemia          Lab Results      Component    Value    Date           WBC    9.2    03/21/2021           RBC    3.67 (L)    03/21/2021           HGB    9.9 (L)    03/21/2021           HCT    30.7 (L)    03/21/2021           MCV    83.7    03/21/2021           MCH    26.9    03/21/2021           MCHC    32.1    03/21/2021           RDW    21.9 (H)    03/21/2021           PLT  318     Arthritis     Calculus of kidney     Calculus of ureter     CHF (congestive heart failure) (CMS-HCC)     Chronic atrial fibrillation (CMS-HCC) 07/20/2019    COPD (chronic obstructive pulmonary disease) (CMS-HCC)     Diabetes (CMS-HCC)     Gangrenous cholecystitis 10/11/2020    Generalized edema  06/17/2021    GERD (gastroesophageal reflux disease)     Hydronephrosis     Hypertension     Hyponatremia 10/11/2020 Intermediate coronary syndrome (CMS-HCC) 03/13/2014    Lower extremity edema 09/28/2020    Lumbar stenosis     Microscopic hematuria     Nausea alone     Nephrolithiasis 04/17/2016    Neuropathy     Nocturia     Nocturia 07/01/2017    Other chronic cystitis     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  See plans for anemia, AKI  We are also tapering her gabapentin.  Currently on 300 mg 3 times daily.  We will decrease it to twice daily, and then nightly.  She states she is not having any recurrence of her pain.    Pulmonary hypertension (CMS-HCC)     Renal colic     Shortness of breath 04/28/2020    Sleep apnea     Unstable angina pectoris (CMS-HCC) 03/13/2014       Past Surgical History:   Procedure Laterality Date    BACK SURGERY  1995    CARPAL TUNNEL RELEASE Left 2014    HYSTERECTOMY  1971    IR INSERT CHOLECYSTOSTOMY TUBE PERCUTANEOUS  10/02/2020    IR INSERT CHOLECYSTOSMY TUBE PERCUTANEOUS 10/02/2020 Braulio Conte, MD IMG VIR [facility]    LUMBAR DISC SURGERY      PR REMOVAL GALLBLADDER N/A 10/06/2020    Procedure: CHOLECYSTECTOMY;  Surgeon: Katherina Mires, MD;  Location: MAIN OR [facility];  Service: Trauma    PR RIGHT HEART CATH O2 SATURATION & CARDIAC OUTPUT N/A 09/30/2020    Procedure: Right Heart Catheterization;  Surgeon: Neal Dy, MD;  Location: [facility] CATH;  Service: Cardiology    PR RIGHT HEART CATH O2 SATURATION & CARDIAC OUTPUT N/A 01/09/2021    Procedure: Right Heart Catheterization;  Surgeon: Lesle Reek, MD;  Location: [facility] CATH;  Service: Cardiology

## 2024-03-14 DIAGNOSIS — E0590 Thyrotoxicosis, unspecified without thyrotoxic crisis or storm: Principal | ICD-10-CM

## 2024-03-14 MED ORDER — FOSFOMYCIN TROMETHAMINE 3 GRAM ORAL PACKET
PACK | ORAL | 6 refills | 28.00 days | Status: CP
Start: 2024-03-14 — End: ?

## 2024-03-16 MED ORDER — CYCLOBENZAPRINE 5 MG TABLET
ORAL_TABLET | 0 refills | 0.00 days | Status: CP
Start: 2024-03-16 — End: ?

## 2024-03-16 NOTE — Unmapped (Signed)
 Refill request received for patient.      Medication Requested: Flexeril  Last Office Visit: 03/13/2024   Next Office Visit: 04/26/2024  Last Prescriber: Concha Se    Nurse refill requirements met? No  If not met, why: Medication not on approved list for [facility] to refill       Sent to: Provider for signing  If sent to provider, which provider?: Baker, E

## 2024-03-17 DIAGNOSIS — E0590 Thyrotoxicosis, unspecified without thyrotoxic crisis or storm: Principal | ICD-10-CM

## 2024-03-17 DIAGNOSIS — R7989 Other specified abnormal findings of blood chemistry: Principal | ICD-10-CM

## 2024-03-17 NOTE — Unmapped (Signed)
 Thank you for your Ambulatory E-Consult.     To summarize: 83 y.o. female referred for E-consult for low TSH in the setting of amiodarone use.    Per referring provider:    I am requesting an e-Consult for the following reason: hyperthyroid, on amiodarone     My clinical question is: next steps for treatment. I have ordered TSI     Recently presented with recurrent afib, Has amyloid cardiomyopathy.     Per my targeted review of the chart:    On chronic amiodarone.  Appears this was started in outpatient clinic January 2022.  She may have also received some doses during hospitalization in 2020 and 2021.    No documented history of thyroid disease.  TSH has been normal on multiple serial checks since 2022.  TSH was low at 0.032 for the first time on 03/13/2024.  Free T4 was above lab reference range on recent check.  Free T3 was low when checked in 08/2023 when TSH was normal.    No recent contrast exposures recorded in the [facility] system Imaging section.    No mention of thyroid goiter or nodules on exam    A CT of the chest with contrast in 04/2018 mentioned a subcentimeter hypodense posterior left thyroid nodule.        Current Outpatient Medications:     ACCU-CHEK GUIDE TEST STRIPS Strp, USE TO CHECK BLOOD SUGAR DAILY, Disp: 50 strip, Rfl: 0    acetaminophen (TYLENOL) 500 MG tablet, Take 2 tablets (1,000 mg total) by mouth Three (3) times a day as needed for pain., Disp: , Rfl:     albuterol HFA 90 mcg/actuation inhaler, Inhale 2 puffs every eight (8) hours as needed for wheezing. (Patient not taking: Reported on 03/13/2024), Disp: , Rfl:     amiodarone (PACERONE) 200 MG tablet, Take 1 tablet (200 mg total) by mouth daily., Disp: 30 tablet, Rfl: 11    apixaban (ELIQUIS) 2.5 mg Tab, Take 1 tablet (2.5 mg total) by mouth two (2) times a day., Disp: 180 tablet, Rfl: 3    cranberry 500 mg cap, Take 500 mg by mouth daily with evening meal., Disp: , Rfl:     cyclobenzaprine (FLEXERIL) 5 MG tablet, TAKE 1 AND 1/2 TABLETS BY MOUTH TWICE DAILY AS NEEDED FOR MUSCLE SPASMS, Disp: 90 tablet, Rfl: 0    ergocalciferol-1,250 mcg, 50,000 unit, (DRISDOL) 1,250 mcg (50,000 unit) capsule, Take 1 capsule (1,250 mcg total) by mouth once a week., Disp: 12 capsule, Rfl: 3    estradioL (ESTRACE) 0.01 % (0.1 mg/gram) vaginal cream, Insert 2 g into the vagina Two (2) times a week., Disp: 42.5 g, Rfl: 3    ferrous sulfate 325 (65 FE) MG EC tablet, Take 1 tablet (325 mg total) by mouth Every Monday, Wednesday, and Friday., Disp: 36 tablet, Rfl: 3    fluticasone propion-salmeterol (ADVAIR HFA) 115-21 mcg/actuation inhaler, Inhale 2 puffs two (2) times a day., Disp: 12 g, Rfl: 11    fosfomycin (MONUROL) 3 gram Pack, Take 3 g by mouth once a week., Disp: 4 packet, Rfl: 6    gabapentin (NEURONTIN) 100 MG capsule, Take 1 capsule (100 mg total) by mouth two (2) times a day., Disp: 60 capsule, Rfl: 11    lancets (ACCU-CHEK SOFTCLIX LANCETS) Misc, CHECK BLOOD SUGAR DAILY, Disp: 200 each, Rfl: 11    lidocaine (ASPERCREME) 4 % patch, Place 1 patch on the skin daily. Remove after 12 hours., Disp: 30 patch, Rfl: 2  metOLazone (ZAROXOLYN) 5 MG tablet, Take 1 tablet (5 mg total) by mouth daily as needed (up to 3x weekly when instructed by cardiology clinic)., Disp: , Rfl:     midodrine (PROAMATINE) 5 MG tablet, Take 1 tablet (5 mg total) by mouth three (3) times a day., Disp: 90 tablet, Rfl: 11    montelukast (SINGULAIR) 10 mg tablet, Take 1 tablet (10 mg total) by mouth every morning., Disp: 90 tablet, Rfl: 2    NARCAN 4 mg/actuation nasal spray, 1 spray into alternating nostrils once as needed (opioid overdose). PRN - Emergency use., Disp: , Rfl:     oxyCODONE (ROXICODONE) 5 MG immediate release tablet, Take 1 tablet (5 mg total) by mouth every eight (8) hours as needed for pain., Disp: , Rfl:     OXYGEN-AIR DELIVERY SYSTEMS MISC, 5 L by Miscellaneous route. Currently using 3  L/min via [place], Disp: , Rfl:     pantoprazole (PROTONIX) 20 MG tablet, TAKE 1 TABLET(20 MG) BY MOUTH DAILY, Disp: 90 tablet, Rfl: 3    potassium chloride 20 MEQ ER tablet, Take 1 tablet (20 mEq total) by mouth daily., Disp: 90 tablet, Rfl: 3    rosuvastatin (CRESTOR) 5 MG tablet, TAKE 1 TABLET EVERY OTHER DAY, Disp: 45 tablet, Rfl: 3    spironolactone (ALDACTONE) 25 MG tablet, Take 0.5 tablets (12.5 mg total) by mouth daily., Disp: 30 tablet, Rfl: 2    tafamidis (VYNDAMAX) 61 mg cap, Take 1 capsule (61 mg) by mouth daily., Disp: 30 capsule, Rfl: 11    tiotropium bromide (SPIRIVA RESPIMAT) 2.5 mcg/actuation inhalation mist, Inhale 2 puffs daily., Disp: 4 g, Rfl: 11    torsemide (DEMADEX) 20 MG tablet, Take 4 tablets (80 mg total) by mouth daily., Disp: 360 tablet, Rfl: 3    traZODone (DESYREL) 50 MG tablet, Take 1 tablet (50 mg total) by mouth nightly as needed for sleep., Disp: 30 tablet, Rfl: 3    vitamin A-3,000 mcg RAE, 10,000 UNIT, 3,000 mcg RAE (10,000 UNIT) capsule, Take 1 capsule (3,000 mcg of RAE total) by mouth daily., Disp: , Rfl:     vutrisiran (AMVUTTRA) 25 mg/0.5 mL injection, Inject 0.5 mL (25 mg total) under the skin once. Every 12 Weeks, Disp: , Rfl:     Allergies   Allergen Reactions    Nitrofurantoin Anaphylaxis and Other (See Comments)    Lipitor [Atorvastatin] Muscle Pain     SOB, Headache, fatigue, sick on my stomach     Relevant Data:    Lab Results   Component Value Date    TSH 0.032 (L) 03/13/2024    TSH 2.346 09/23/2023    TSH 0.963 08/31/2023    TSH 1.376 07/14/2023    TSH 2.196 02/15/2023    TSH 1.214 10/22/2022    TSH 2.140 05/13/2022    TSH 2.731 12/06/2021    TSH 2.144 11/30/2021    TSH 2.122 06/17/2021     Lab Results   Component Value Date    FREET4 2.34 (H) 03/13/2024    FREET4 1.39 09/23/2023    FREET4 1.79 (H) 08/31/2023    FREET4 1.24 04/14/2015     Lab Results   Component Value Date    FREET3 1.77 (L) 08/31/2023     No TPO Ab, TSH-R Ab or TSI in the system  PCP has ordered TSI and it is pending    My recommendations are as follows: 83 y.o. female referred for E-consult for low TSH and elevated free T4 in the setting  of chronic amiodarone use.     Agree with checking TSI.  If elevated, and if TSH remains persistently suppressed below 0.1-0.2, would be reasonable to treat with low-dose methimazole 2.5 mg to 5 mg daily with monthly TFT checks to ensure TSH normalizes.  Would also repeat TSH and free T4 with upcoming labs, and also do free T3.  This is mainly to follow overall trend and to check the free T3 level.  Normally, in people taking amiodarone, the free T4 runs slightly higher than lab reference range and the free T3 can run slightly lower than lab reference range (with normal TSH).  This is due to physiologic effect of amiodarone reducing conversion of free T4 to free T3.  This specific biochemical profile does not require treatment.   Loop diuretic use can also cause false/in vitro elevation of free T4 levels.  If TSI is negative, and TSH remains persistently suppressed below 0.1-0.2, and especially if free T4 becomes markedly elevated and/or the free T3 is elevated, hyperthyroidism should be treated.  Differential diagnosis includes either type I amiodarone induced thyroiditis (less common this far out, usually responds to methimazole) and type II amiodarone induced thyroiditis (more common this far out from starting amiodarone, often requires prolonged steroids).  The typical approach is to start prednisone and methimazole at the same time, and if there is a quick response that favors type II thyroiditis, we will wean the methimazole.  In this patient's case, the risks of starting both medication at once may outweigh the benefits especially if the thyroid elevations are mild.  Could try methimazole 5 mg/day for a few weeks, follow TFT trend, and if TSH is improving continue low dose methimazole (2.5-5 mg/day) to maintain normal TSH.  Lastly, if TSI is negative, and TSH remains mildly suppressed but free T4 is only modestly elevated and free T3 is not elevated, patient could be observed with serial TFT measurements, off of any specific thyroid medication.    Any person treated with methimazole should be counseled about rare but serious/life-threatening reactions including agranulocytosis and liver dysfunction and symptoms and signs to report or stop the medication.  These are rare idiosyncratic reactions, especially with methimazole dose is this low.  Nonetheless, we counseled all patients.    I spent 16-20 minutes in medical consultative verbal and/or internet discussion and review of medical records, including a written report to the treating provider via electronic health record regarding the condition of this patient.     This Ambulatory E-Consult did include an answerable clinical question and did not recommend a clinic visit.      Rodney Langton, MD  [facility] Endocrinology  Phone [phone]  Fax [phone]]      This Ambulatory E-Consult is based solely on the clinical information available to me in the patient's medical record and is provided without benefit of a comprehensive evaluation or physical examination of the patient. The information contained in this E-Consult must be interpreted in light of any clinical issues or changes in patient status that were not known to me at the time the E-Consult was completed. You must rely on your own informed clinical judgement for decision making. If necessary, refer the  patient for an in-office consultation. Please contact me if you have further questions.

## 2024-03-19 MED ORDER — TRAZODONE 50 MG TABLET
ORAL_TABLET | Freq: Every evening | ORAL | 0 refills | 0 days | PRN
Start: 2024-03-19 — End: ?

## 2024-03-20 ENCOUNTER — Inpatient Hospital Stay: Admit: 2024-03-20 | Discharge: 2024-03-21 | Payer: MEDICARE

## 2024-03-20 ENCOUNTER — Encounter: Admit: 2024-03-20 | Discharge: 2024-03-21 | Payer: MEDICARE

## 2024-03-20 ENCOUNTER — Ambulatory Visit: Admit: 2024-03-20 | Discharge: 2024-03-21

## 2024-03-20 DIAGNOSIS — E0590 Thyrotoxicosis, unspecified without thyrotoxic crisis or storm: Principal | ICD-10-CM

## 2024-03-20 LAB — T3, FREE: T3 FREE: 3.17 pg/mL (ref 2.30–4.20)

## 2024-03-20 LAB — TSH: THYROID STIMULATING HORMONE: 0.027 u[IU]/mL — ABNORMAL LOW (ref 0.550–4.780)

## 2024-03-20 LAB — T4, FREE: FREE T4: 2.44 ng/dL — ABNORMAL HIGH (ref 0.89–1.76)

## 2024-03-20 MED ORDER — TRAZODONE 50 MG TABLET
ORAL_TABLET | Freq: Every evening | ORAL | 1 refills | 30 days | PRN
Start: 2024-03-20 — End: 2025-03-20

## 2024-03-20 MED ADMIN — sodium chloride (NS) 0.9 % infusion: INTRAVENOUS | @ 16:00:00 | Stop: 2024-03-20

## 2024-03-20 MED ADMIN — Propofol (DIPRIVAN) injection: INTRAVENOUS | @ 16:00:00 | Stop: 2024-03-20

## 2024-03-20 NOTE — Unmapped (Signed)
 Refill request received for patient.      Medication Requested: trazodone  Last Office Visit: 03/13/2024   Next Office Visit: 04/26/2024  Last Prescriber: Derwood Kaplan    Nurse refill requirements met? No  If not met, why: Non-cardiac med. Medication not on approved list for [facility] to refill    Sent to: Provider for signing  If sent to provider, which provider?: Derwood Kaplan

## 2024-03-21 MED ORDER — TRAZODONE 50 MG TABLET
ORAL_TABLET | Freq: Every evening | ORAL | 1 refills | 30 days | Status: CP | PRN
Start: 2024-03-21 — End: 2025-03-21

## 2024-03-27 LAB — THYROID STIMULATING IMMUNOGLOBULIN: THYROID STIMULATING IMMUNOGLOB: <1 {TSI_index}

## 2024-03-28 DIAGNOSIS — E0590 Thyrotoxicosis, unspecified without thyrotoxic crisis or storm: Principal | ICD-10-CM

## 2024-03-28 MED ORDER — METHIMAZOLE 5 MG TABLET
ORAL_TABLET | Freq: Every day | ORAL | 2 refills | 30 days | Status: CP
Start: 2024-03-28 — End: 2025-03-28

## 2024-03-28 NOTE — Unmapped (Signed)
 See my chart message to start methimazole

## 2024-03-31 DIAGNOSIS — N390 Urinary tract infection, site not specified: Principal | ICD-10-CM

## 2024-03-31 MED ORDER — FOSFOMYCIN TROMETHAMINE 3 GRAM ORAL PACKET
PACK | ORAL | 6 refills | 28 days
Start: 2024-03-31 — End: ?

## 2024-04-04 MED ORDER — FOSFOMYCIN TROMETHAMINE 3 GRAM ORAL PACKET
PACK | ORAL | 6 refills | 28.00 days | Status: CP
Start: 2024-04-04 — End: ?

## 2024-04-06 NOTE — Unmapped (Signed)
 [facility] Clinical Assessment & Refill Coordination Note    Barbara Huber, DOB: 1941/03/19  Phone: [phone] (home)     All above HIPAA information was verified with patient.     Was a Nurse, learning disability used for this call? No    Specialty Medication(s):   Cardiology: Vyndamax     Current Outpatient Medications   Medication Sig Dispense Refill    ACCU-CHEK GUIDE TEST STRIPS Strp USE TO CHECK BLOOD SUGAR DAILY 50 strip 0    acetaminophen (TYLENOL) 500 MG tablet Take 2 tablets (1,000 mg total) by mouth Three (3) times a day as needed for pain.      albuterol HFA 90 mcg/actuation inhaler Inhale 2 puffs every eight (8) hours as needed for wheezing. (Patient not taking: Reported on 03/13/2024)      amiodarone (PACERONE) 200 MG tablet Take 1 tablet (200 mg total) by mouth daily. 30 tablet 11    apixaban (ELIQUIS) 2.5 mg Tab Take 1 tablet (2.5 mg total) by mouth two (2) times a day. 180 tablet 3    cranberry 500 mg cap Take 500 mg by mouth daily with evening meal.      cyclobenzaprine (FLEXERIL) 5 MG tablet TAKE 1 AND 1/2 TABLETS BY MOUTH TWICE DAILY AS NEEDED FOR MUSCLE SPASMS 90 tablet 0    estradioL (ESTRACE) 0.01 % (0.1 mg/gram) vaginal cream Insert 2 g into the vagina Two (2) times a week. (Patient not taking: Reported on 03/20/2024) 42.5 g 3    ferrous sulfate 325 (65 FE) MG EC tablet Take 1 tablet (325 mg total) by mouth Every Monday, Wednesday, and Friday. 36 tablet 3    fluticasone propion-salmeterol (ADVAIR HFA) 115-21 mcg/actuation inhaler Inhale 2 puffs two (2) times a day. 12 g 11    fosfomycin (MONUROL) 3 gram Pack Take 3 g by mouth once a week. 4 packet 6    gabapentin (NEURONTIN) 100 MG capsule Take 1 capsule (100 mg total) by mouth two (2) times a day. 60 capsule 11    lancets (ACCU-CHEK SOFTCLIX LANCETS) Misc CHECK BLOOD SUGAR DAILY 200 each 11    lidocaine (ASPERCREME) 4 % patch Place 1 patch on the skin daily. Remove after 12 hours. 30 patch 2    methIMAzole (TAPAZOLE) 5 MG tablet Take 1 tablet (5 mg total) by mouth in the morning. 30 tablet 2    metOLazone (ZAROXOLYN) 5 MG tablet Take 1 tablet (5 mg total) by mouth daily as needed (up to 3x weekly when instructed by cardiology clinic).      midodrine (PROAMATINE) 5 MG tablet Take 1 tablet (5 mg total) by mouth three (3) times a day. 90 tablet 11    montelukast (SINGULAIR) 10 mg tablet Take 1 tablet (10 mg total) by mouth every morning. 90 tablet 2    NARCAN 4 mg/actuation nasal spray 1 spray into alternating nostrils once as needed (opioid overdose). PRN - Emergency use.      oxyCODONE (ROXICODONE) 5 MG immediate release tablet Take 1 tablet (5 mg total) by mouth every eight (8) hours as needed for pain.      OXYGEN-AIR DELIVERY SYSTEMS MISC 5 L by Miscellaneous route. Currently using 3  L/min via [place]      pantoprazole (PROTONIX) 20 MG tablet TAKE 1 TABLET(20 MG) BY MOUTH DAILY 90 tablet 3    potassium chloride 20 MEQ ER tablet Take 1 tablet (20 mEq total) by mouth daily. 90 tablet 3    rosuvastatin (CRESTOR)  5 MG tablet TAKE 1 TABLET EVERY OTHER DAY 45 tablet 3    spironolactone (ALDACTONE) 25 MG tablet Take 0.5 tablets (12.5 mg total) by mouth daily. 30 tablet 2    tafamidis (VYNDAMAX) 61 mg cap Take 1 capsule (61 mg) by mouth daily. 30 capsule 11    tiotropium bromide (SPIRIVA RESPIMAT) 2.5 mcg/actuation inhalation mist Inhale 2 puffs daily. 4 g 11    torsemide (DEMADEX) 20 MG tablet Take 4 tablets (80 mg total) by mouth daily. 360 tablet 3    traZODone (DESYREL) 50 MG tablet Take 1 tablet (50 mg total) by mouth nightly as needed for sleep. 30 tablet 1    vitamin A-3,000 mcg RAE, 10,000 UNIT, 3,000 mcg RAE (10,000 UNIT) capsule Take 1 capsule (3,000 mcg of RAE total) by mouth daily.      vutrisiran (AMVUTTRA) 25 mg/0.5 mL injection Inject 0.5 mL (25 mg total) under the skin once. Every 12 Weeks       No current facility-administered medications for this visit.        Changes to medications: Barbara Huber reports no changes at this time.    Medication list has been reviewed and updated in Epic: Yes    Allergies   Allergen Reactions    Nitrofurantoin Anaphylaxis and Other (See Comments)    Lipitor [Atorvastatin] Muscle Pain     SOB, Headache, fatigue, sick on my stomach       Changes to allergies: No    Allergies have been reviewed and updated in Epic: Yes    SPECIALTY MEDICATION ADHERENCE     Vyndamax 61 mg: 10 days of medicine on hand       Medication Adherence    Patient reported X missed doses in the last month: 0  Specialty Medication: VYNDAMAX 61 mg Cap (tafamidis)          Specialty medication(s) dose(s) confirmed: Regimen is correct and unchanged.     Are there any concerns with adherence? No    Adherence counseling provided? Not needed    CLINICAL MANAGEMENT AND INTERVENTION      Clinical Benefit Assessment:    Do you feel the medicine is effective or helping your condition? Yes    Clinical Benefit counseling provided? Not needed    Adverse Effects Assessment:    Are you experiencing any side effects? No    Are you experiencing difficulty administering your medicine? 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 For example, brushing your teeth or getting up in the morning. 0    Have you discussed this with your provider? Not needed    Acute Infection Status:    Acute infections noted within Epic:  No active infections    Patient reported infection: None    Therapy Appropriateness:    Is therapy appropriate based on current medication list, adverse reactions, adherence, clinical benefit and progress toward achieving therapeutic goals? Yes, therapy is appropriate and should be continued     Clinical Intervention:    Was an intervention completed as part of this clinical assessment? No    DISEASE/MEDICATION-SPECIFIC INFORMATION      N/A    Cardiology: Not Applicable    PATIENT SPECIFIC NEEDS     Does the patient have  any physical, cognitive, or cultural barriers? No    Is the patient high risk? No    Does the patient require physician intervention or other additional services (i.e., nutrition, smoking cessation, social work)? No    Does the patient have an additional or emergency contact listed in their chart? Yes    SOCIAL DETERMINANTS OF HEALTH     At the [facility], we have learned that life circumstances - like trouble affording food, housing, utilities, or transportation can affect the health of many of our patients.   That is why we wanted to ask: are you currently experiencing any life circumstances that are negatively impacting your health and/or quality of life? Patient declined to answer    Social Drivers of Health     Food Insecurity: No Food Insecurity (11/02/2023)    Hunger Vital Sign     Worried About Running Out of Food in the Last Year: Never true     Ran Out of Food in the Last Year: Never true   Tobacco Use: Medium Risk (03/13/2024)    Patient History     Smoking Tobacco Use: Former     Smokeless Tobacco Use: Never     Passive Exposure: Not on file   Transportation Needs: No Transportation Needs (11/02/2023)    PRAPARE - Therapist, art (Medical): No     Lack of Transportation (Non-Medical): No   Alcohol Use: Not At Risk (03/18/2023)    Alcohol Use     How often do you have a drink containing alcohol?: Never     How many drinks containing alcohol do you have on a typical day when you are drinking?: 1 - 2     How often do you have 5 or more drinks on one occasion?: Never   Housing: Low Risk  (11/02/2023)    Housing     Within the past 12 months, have you ever stayed: outside, in a car, in a tent, in an overnight shelter, or temporarily in someone else's home (i.e. couch-surfing)?: No     Are you worried about losing your housing?: No   Physical Activity: Unknown (11/04/2017)    Received from [facility], [facility]    Exercise Vital Sign     Days of Exercise per Week: Patient declined     Minutes of Exercise per Session: Patient declined   Utilities: Low Risk  (11/02/2023)    Utilities     Within the past 12 months, have you been unable to get utilities (heat, electricity) when it was really needed?: No   Stress: Unknown (11/04/2017)    Received from [facility], [facility]    [employer] of Occupational Health - Occupational Stress Questionnaire     Feeling of Stress : Patient declined   Interpersonal Safety: Not At Risk (11/18/2023)    Interpersonal Safety     Unsafe Where You Currently Live: No     Physically Hurt by Anyone: No     Abused by Anyone: No   Substance Use: Low Risk  (06/17/2023)    Substance Use     In the past year, how often have you used prescription drugs for non-medical reasons?: Never     In the past year, how often have you used illegal drugs?: Never     In the past year, have you used any substance for non-medical reasons?: No   Intimate Partner Violence:  Not At Risk (12/17/2021)    Humiliation, Afraid, Rape, and Kick questionnaire     Fear of Current or Ex-Partner: No     Emotionally Abused: No     Physically Abused: No     Sexually Abused: No   Social Connections: Unknown (11/04/2017)    Received from [facility], [facility]    Social Connection and Isolation Panel [NHANES]     Frequency of Communication with Friends and Family: Patient declined     Frequency of Social Gatherings with Friends and Family: Patient declined     Attends Religious Services: Patient declined     Database administrator or Organizations: Patient declined     Attends Banker Meetings: Patient declined     Marital Status: Patient declined   Programmer, applications: Low Risk  (11/02/2023)    Overall Financial Resource Strain (CARDIA)     Difficulty of Paying Living Expenses: Not hard at all   Depression: Not at risk (03/18/2023)    PHQ-2     PHQ-2 Score: 0   Internet Connectivity: No Internet connectivity concern identified (11/18/2023)    Internet Connectivity     Do you have access to internet services: Yes     How do you connect to the internet: Personal Device at home     Is your internet connection strong enough for you to watch video on your device without major problems?: Yes     Do you have enough data to get through the month?: Yes     Does at least one of the devices have a camera that you can use for video chat?: Yes   Health Literacy: Medium Risk (11/18/2023)    Health Literacy     : Sometimes       Would you be willing to receive help with any of the needs that you have identified today? Not applicable       SHIPPING     Specialty Medication(s) to be Shipped:   Cardiology: Vyndamax    Other medication(s) to be shipped: No additional medications requested for fill at this time     Changes to insurance: No    Cost and Payment: Patient has a $0 copay, payment information is not required.    Delivery Scheduled: Yes, Expected medication delivery date: 4/15.     Medication will be delivered via Next Day Courier to the confirmed prescription address in [facility].    The patient will receive a drug information handout for each medication shipped and additional FDA Medication Guides as required.  Verified that patient has previously received a Conservation officer, historic buildings and a Surveyor, mining.    The patient or caregiver noted above participated in the development of this care plan and knows that they can request review of or adjustments to the care plan at any time.      All of the patient's questions and concerns have been addressed.    Clydell Hakim, PharmD   [facility] Specialty Pharmacist

## 2024-04-10 MED FILL — VYNDAMAX 61 MG CAPSULE: ORAL | 30 days supply | Qty: 30 | Fill #4

## 2024-04-12 MED ORDER — CYCLOBENZAPRINE 5 MG TABLET
ORAL_TABLET | 0 refills | 0.00 days | Status: CP
Start: 2024-04-12 — End: ?

## 2024-04-12 NOTE — Unmapped (Signed)
 Refill request received for patient.      Medication Requested: Cyclobenzaprine  Last Office Visit: 03/13/2024   Next Office Visit: 04/26/2024  Last Prescriber: Mahlon Schwab    Nurse refill requirements met? No  If not met, why: Medication not on approved list for [facility] to refill       Sent to: Provider for signing  If sent to provider, which provider?: E. Engineer, production

## 2024-04-17 ENCOUNTER — Ambulatory Visit: Admit: 2024-04-17 | Discharge: 2024-04-18 | Payer: Medicare (Managed Care)

## 2024-04-17 LAB — CBC W/ AUTO DIFF
BASOPHILS ABSOLUTE COUNT: 0.1 10*9/L (ref 0.0–0.1)
BASOPHILS RELATIVE PERCENT: 1.1 %
EOSINOPHILS ABSOLUTE COUNT: 0.1 10*9/L (ref 0.0–0.5)
EOSINOPHILS RELATIVE PERCENT: 1.4 %
HEMATOCRIT: 36.2 % (ref 34.0–44.0)
HEMOGLOBIN: 11.9 g/dL (ref 11.3–14.9)
LYMPHOCYTES ABSOLUTE COUNT: 1.1 10*9/L (ref 1.1–3.6)
LYMPHOCYTES RELATIVE PERCENT: 14.9 %
MEAN CORPUSCULAR HEMOGLOBIN CONC: 32.8 g/dL (ref 32.0–36.0)
MEAN CORPUSCULAR HEMOGLOBIN: 29.8 pg (ref 25.9–32.4)
MEAN CORPUSCULAR VOLUME: 91 fL (ref 77.6–95.7)
MEAN PLATELET VOLUME: 8.1 fL (ref 6.8–10.7)
MONOCYTES ABSOLUTE COUNT: 0.8 10*9/L (ref 0.3–0.8)
MONOCYTES RELATIVE PERCENT: 11 %
NEUTROPHILS ABSOLUTE COUNT: 5.2 10*9/L (ref 1.8–7.8)
NEUTROPHILS RELATIVE PERCENT: 71.6 %
NUCLEATED RED BLOOD CELLS: 0 /100{WBCs} (ref <=4)
PLATELET COUNT: 217 10*9/L (ref 150–450)
RED BLOOD CELL COUNT: 3.98 10*12/L (ref 3.95–5.13)
RED CELL DISTRIBUTION WIDTH: 14.2 % (ref 12.2–15.2)
WBC ADJUSTED: 7.2 10*9/L (ref 3.6–11.2)

## 2024-04-17 LAB — T3, FREE: T3 FREE: 2.18 pg/mL — ABNORMAL LOW (ref 2.30–4.20)

## 2024-04-17 LAB — T4, FREE: FREE T4: 1.79 ng/dL — ABNORMAL HIGH (ref 0.89–1.76)

## 2024-04-17 LAB — TSH: THYROID STIMULATING HORMONE: 0.155 u[IU]/mL — ABNORMAL LOW (ref 0.550–4.780)

## 2024-04-22 MED ORDER — ACCU-CHEK GUIDE TEST STRIPS
ORAL_STRIP | 0 refills | 0.00000 days
Start: 2024-04-22 — End: ?

## 2024-04-24 MED ORDER — ACCU-CHEK GUIDE TEST STRIPS
ORAL_STRIP | ORAL | 3 refills | 0.00000 days | Status: CP
Start: 2024-04-24 — End: ?

## 2024-04-26 ENCOUNTER — Ambulatory Visit
Admit: 2024-04-26 | Discharge: 2024-04-26 | Payer: Medicare (Managed Care) | Attending: Adult Health | Primary: Adult Health

## 2024-04-26 ENCOUNTER — Encounter: Admit: 2024-04-26 | Discharge: 2024-04-26 | Payer: Medicare (Managed Care)

## 2024-04-26 DIAGNOSIS — E852 Heredofamilial amyloidosis, unspecified: Principal | ICD-10-CM

## 2024-04-26 DIAGNOSIS — E854 Organ-limited amyloidosis: Principal | ICD-10-CM

## 2024-04-26 DIAGNOSIS — I43 Cardiomyopathy in diseases classified elsewhere: Principal | ICD-10-CM

## 2024-04-26 DIAGNOSIS — I5032 Chronic diastolic (congestive) heart failure: Principal | ICD-10-CM

## 2024-04-26 DIAGNOSIS — N184 Chronic kidney disease, stage 4 (severe): Principal | ICD-10-CM

## 2024-04-26 DIAGNOSIS — I4819 Other persistent atrial fibrillation: Principal | ICD-10-CM

## 2024-04-26 DIAGNOSIS — J9611 Chronic respiratory failure with hypoxia: Principal | ICD-10-CM

## 2024-04-26 LAB — COMPREHENSIVE METABOLIC PANEL
ALBUMIN: 3 g/dL — ABNORMAL LOW (ref 3.4–5.0)
ALKALINE PHOSPHATASE: 112 U/L (ref 46–116)
ALT (SGPT): 7 U/L — ABNORMAL LOW (ref 10–49)
ANION GAP: 13 mmol/L (ref 5–14)
AST (SGOT): 22 U/L (ref <=34)
BILIRUBIN TOTAL: 0.4 mg/dL (ref 0.3–1.2)
BLOOD UREA NITROGEN: 55 mg/dL — ABNORMAL HIGH (ref 9–23)
BUN / CREAT RATIO: 21
CALCIUM: 9.3 mg/dL (ref 8.7–10.4)
CHLORIDE: 98 mmol/L (ref 98–107)
CO2: 26.3 mmol/L (ref 20.0–31.0)
CREATININE: 2.58 mg/dL — ABNORMAL HIGH (ref 0.55–1.02)
EGFR CKD-EPI (2021) FEMALE: 18 mL/min/1.73m2 — ABNORMAL LOW (ref >=60)
GLUCOSE RANDOM: 117 mg/dL (ref 70–179)
POTASSIUM: 4.6 mmol/L (ref 3.4–4.8)
PROTEIN TOTAL: 7.3 g/dL (ref 5.7–8.2)
SODIUM: 137 mmol/L (ref 135–145)

## 2024-04-26 LAB — MAGNESIUM: MAGNESIUM: 2.6 mg/dL (ref 1.6–2.6)

## 2024-04-26 LAB — PRO-BNP: PRO-BNP: 8183 pg/mL — ABNORMAL HIGH (ref <=300.0)

## 2024-04-26 MED ORDER — NYSTATIN 100,000 UNIT/GRAM TOPICAL POWDER
TOPICAL | 1 refills | 0.00000 days | Status: CP
Start: 2024-04-26 — End: 2025-04-26

## 2024-04-26 MED ORDER — CYCLOBENZAPRINE 10 MG TABLET
ORAL_TABLET | Freq: Two times a day (BID) | ORAL | 11 refills | 30.00000 days | Status: CP | PRN
Start: 2024-04-26 — End: 2025-04-26

## 2024-04-26 MED ADMIN — vutrisiran (AMVUTTRA) syringe 25 mg: 25 mg | SUBCUTANEOUS | @ 19:00:00 | Stop: 2024-04-26

## 2024-04-26 NOTE — Unmapped (Signed)
 Today,    MEDICATIONS:  We are changing your medications today.  - take metolazone  x 1 dose tomorrow and let me know what happens to the weight.   Call if you have questions about your medications.    LABS:  We will call you if your labs need attention.    NEXT APPOINTMENT:  Return to clinic in 3 month with me      In general, to take care of your heart failure:  -Limit your fluid intake to 2 Liters (half-gallon) per day.    -Limit your salt intake to ideally 2-3 grams (2000-3000 mg) per day.  -Weigh yourself daily and record, and bring that weight diary to your next appointment.  (Weight gain of 2-3 pounds in 1 day typically means fluid weight.)    The medications for your heart are to help your heart and help you live longer.    Please contact us  before stopping any of your heart medications.    Call the clinic at [phone] with questions.  Our clinic fax number is [phone].  If you need to reschedule future appointments, please call [phone] or [phone]  My office number (c/o Sallie Craze RN) is [phone] if you need further assistance.    Please do not send a MyChart message for potentially life-threatening symptoms.  Please call 911 for a true medical emergency.    To learn more about heart failure, please read [facility] Learning to Live with Heart Failure - Available online at:  [email or web address] - open the window for Medical Management and click the link Living with Heart Failure   (Can search [facility] heart failure on the web to find the link.)

## 2024-04-26 NOTE — Unmapped (Signed)
 [facility] HF Amyloid Clinic Note    Referring Provider: Lucretia Sabot, AGNP  [address]  [address]  [place],  [place] [address]   Primary Provider: Karlton Overly, MD  [address]  [place] [address]   Other Providers:  Dr Trevor Fudge, Dr Reggy Capers    Reason for Visit:  Barbara Huber is a 83 y.o. female being seen for routine visit and continued care of cardiac amyloidosis .    Assessment & Plan:  1. 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 SPEP/UPEP/free light chains without concern. 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 Daughter is informed and has seen cardiology Sindy Dues).   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 2nd injection July 2024, 3rd in November 2024, 4th in January 2025, 5th in April. Continue vitamin A  supplementation.   - DC'd Jardiance  10mg  daily due to recurrent UTIs  - Weight at home has been <180, around 175-176 on torsemide  80mg  daily  - She has mild LE edema L>R and elevated JVP; advised her to take metolazone  5mg  x 1 tomorrow  - spironolactone  12.5mg  daily  - Continue KCl 20mEq daily  - Taking midodrine  5mg  TID with BP in 90-110s/60-70s at home. Tried stopping PM dose for concern it was contributing to insomnia, but no improvement in sleep. Also tried stopping midday dose for concern it was causing dry mouth, but then BP dropped low. Continue to monitor BP and can increase to 10mg  for SBP <90   - Continue Tafamidis  61mg  daily. Consider switch to acoramidis for worsening symptoms.   - She has not been interested in CardioMEMS  1b. Goals of Care  - Had a discussion at prior visit w/ pt and daughter [name] regarding pt's goals of care.   We discussed role of palliative care (has seen Dr Sharion Davidson) and hospice (when appropriate). She may wish to return to hospital if needed, but likely not ICU level care or invasive monitoring (such as swan)    2. Persistent atrial fibrillation  - CHA2DS2-VASc score = 5 (1-gender, 2-Age, 1-DM, 1-HTN)  - We had previously stopped Eliquis  given recurrent anemia, shared decision making w/ pt and daughter. She was not interested in [place] at the time.   - Eliquis  2.5mg  BID restarted during hospitalization - will closely follow hemoglobin in follow up. Hemoglobin has been stable.   - s/p DCCV on 11/17/23 and again 03/20/24  - On amiodarone  200mg  daily  - Mild hyperthyroid being managed by PCP/methimazole   - For recurrent AF, she wouldn't want invasive procedures (like PPM and AVN ablation)    3. [name]  - Cr baseline around 2.3-3.0  - Peaked at 5.4 during admission, 2.93 on discharge  - today Cr 2.5    4. COPD, OSA  - home O2 3-4L [place] - followed by pulm  - on Advair  and Spiriva   - Uses trilogy at night     5. Iron  deficiency anemia  - As above, follow hemoglobin while on Eliquis    Lab Results   Component Value Date    FERRITIN 336.3 (H) 08/19/2023  Lab Results   Component Value Date    HGB 11.9 04/17/2024     6. Anxiety  - previously on Zoloft   - Tried Remeron , but discontinued in setting of worsening HF symptoms    7. DM  - Off Insulin  with A1c <7  - Followed by PCP   Lab Results   Component Value Date    A1C 4.8 03/03/2024     8. Chronic back pain  - follows with pain clinic, on oxycodone   - gabapentin  BID PRN   - Continue flexeril  10mg  PRN nightly, has been tolerating this dose    Follow-up:  Return in about 3 months (around 07/26/2024) for Return HF.     History of Present Illness:  Barbara Huber is a 83 y.o. female with PMHx of COPD on 3L home [place], CHF, [name], [name], [name] She was initially admitted with decompensated [name] in 2021 in setting of  Septic Shock d/t Gangrenous Cholecystitis s/p cholecystectomy. She underwent further evaluation with PYP scan which showed TTR amyloid. She has since been followed in clinic for management of her [name]. In 2022, had numerous admissions for weakness/urosepsis, shortness of breath/orthopnea/weight gain, lightheadedness/SOB/Blurry vision found to be anemic.    Summary of most recent visits:  - 01/16/22 [facility]): Feeling well from a cardiac standpoint. Started Zoloft  for anxiety  - 02/13/22 [facility]): Weight 210-211. Feeling well. Taking extra torsemide  20mg  about every other day, which was stopped for elevated Cr.   - 03/13/22 ([name]): Weight 201-216, took metolazone  PRN, weight stabilized at 210lbs. Considering cardioMEMS.   - 05/13/22 ([name]): Had 3 iron  infusions. Doing well w/ volume, weight 211-212lbs. Cooking, staying active around house.   - 05/21/22 ([name]): CP/SOB. Weight 217 from 213. She took metolazone  x1 dose.   - 05/22/22 ([name]): Ed follow up. Back down to 213lbs. Still having CP w/ certain movement.   - 06/05/22 ([name]): Weight 214-216lb, should be 212lb.   - 06/09/22, 06/11/22, 06/22/22 ([name]): Weight down from 217 to 213lb, 211lb at home. Feeling well by 6/26.   - 07/15/22 ([name]): Switched inhaler back to Advair /Spiriva  with improvement in breathing and O2 sat. Weight 207lbs, feeling more like herself.   - 08/19/22 ([name]): Feeling well. Weight 208-210lbs.   - 10/22/22 ([name]): Feeling well. Weight has been 210-214lbs.   - 12/04/22 [facility]): doing well overall, weight a bit up so taking extra torsemide .   - 02/10/23 ([name]): Weight 213-214lbs, feeling well, eating a lot. Had taken metolazone  earlier in the week.   - 02/23/23 [facility]): Hospital follow up after mechanical fall with nondisplaced fractures of the left superior and inferior pubic rami. Taking torsemide  40mg , weight 211.   - 04/14/23 ([name]): Weight 210-212. 1st Amvuttra   - 07/14/23 ([name]): Weight 211-212 on torsemide  60mg  daily. 2nd Amvuttra .   - 08/18/23-08/23/23 (Admission): fatigue, weakness, DOE, recent prednisone course for gout. Admitted to [facility], poor response to IV Lasix  and was changed to IV Bumex  3mg  BID and metolazone . Spiro held for low BP. Cr increased to 4.0 with diuresis. She went back to the ER on 8/24 for abdominal pain. She was felt to be euvolemic at a weight of 207lb and IV diuretics were held. She was discharged on torsemide  60mg  daily on 8/26.   - 08/27/23 [facility]): hospital follow up. Felt to be dry for weight down to 204 at home, Cr 3.4 BUN 96. Changed torsemide  from 60 daily to 40/20.   - 08/30/23-09/02/23 (Admission): Admitted for fatigue  and SOB. Diuresed  with improvement in dyspnea. Weight on discharge was 204.   - 09/06/23 [facility] Hospital follow up. Her weight continued to decrease on her scale by about 1lb per day. Weight 201 that morning. She was a little lightheaded and felt weak. Continued on torsemide  60mg  daily.  Cr 2.61, pro-BNP 4575  - 09/08/23 ([name]/Phone): Weight 201, breathing well.   - 09/16/23 ([facility]): Weight 205, not peeing much with torsemide  60mg . Feeling full in belly. Cr 3.11,  pro-BNP 14492. Metolazone  and extra 40 torsemide .   - 09/17/23 ([facility]): Weight 203, feeling better. Continue torsemide  60/40 through weekend.   - 09/23/23 ([name]): n 9/25 reporting hat she didn't pee much the night before and weight was 203lb that morning. Increased by 2 lbs overnight. Instructed to take metolazone  2.5mg  and extra 40 torsemide  in the afternoon. Today her weight was up and so she requested a visit. She didn't urinate much and this morning was 205lbs. Extra torsemide  60mg  then metolazone  in morning and return for diuresis clinic.   - 09/24/23 (Diuresis): IV Lasix . Arranged for direct admission.   - 9/27-10/5 (Admission): She initially underwent inotrope assisted diuresis with dopamine  and furosemide  gtt, which was stopped on 9/30 after episodes of hypotension requiring NE and midodrine . Norepinephrine  stopped and pulmonary wedge pressure was 12 prior to pulling swan on on 10/1. Midodrine  maintained to maximize preload. Furosemide  gtt transitioned to spot IV lasix  and then was transitioned to home torsemide  60mg  daily on 10/2. Palliative care was consulted and family decided on outpatient evaluation. She was discharged with a pro-BNP of 17K down from 26K and weight of 209lb. Discharged with new home regimen torsemide  60mg  daily, metolazone  prn per clinic, midodrine  5mg  TID, Tafamidis  61mg  daily. She was continued on chronic home amiodarone  200 mg daily for atrial fibrillation and restarted on Eliquis  2.5mg  BID.   - 10/9/242 [facility]): hospital follow up. Weight 193-195. Torsemide  60mg g daily. In AF RVR by EKG. Amio increased to 200mg  BID.   - 10/27/23 ([name]): Weight up to 201 but had been 196-197.  - 11/11/23 [facility]): Hospital follow up, [name] for hypoxia 2/2 malfunction of portable O2. Weight 194 on torsemide  60mg .  - 11/17/23 (EP Procedure): Cardioversion  - 12/15/23 ([name]): Weight 186-189 on torsemide  60mg . Pro-BNP up. Increased torsemide  to 80mg .  - 01/26/24 ([name]): Weight as low as 181 on torsemide  80mg . No med changes.   - 03/13/24 ([name]): in AF. Scheduled for cardioversion.     Interval history  Today she presents for follow up. She has been overall doing pretty well since her last visit. Weight has been stable around 175-176lbs on torsemide  80mg  daily. She tried reducing to 60mg  daily, but didn't pee much, and so increased the dose. She has remained in normal rhythm. Back pain mainly limits her mobility; nothing really seems to help. She hasn't noticed much swelling or dyspnea. Since her last visit started methimazole  for hyperthyroid. Tremors/shakes have not really settled down.       Cardiovascular History & Procedures:    Cath / PCI:  none    CV Surgery:   none    EP Procedures and Devices:  03/20/24 - DCCV  11/17/23 - DCCV  01/10/21 - DCCV    Non-Invasive Evaluation(s):    Echo:  08/19/23  1. The left ventricle is normal in size with moderately increased wall  thickness.    2. 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    3. The left ventricular systolic  function is hyperdynamic, LVEF is visually  estimated at >70%.    4. Mitral annular calcification is present (moderate).    5. The mitral valve leaflets are mildly thickened with normal leaflet  mobility.    6. The aortic valve is trileaflet with mildly thickened leaflets with normal  excursion.    7. The left atrium is moderately dilated in size.    8. The right ventricle is normal in size, with normal systolic function.    02/17/23    1. The left ventricle is normal in size with moderately increased wall  thickness.    2. The left ventricular systolic function is hyperdynamic, LVEF is visually  estimated at >70%.    3. The left atrium is moderately dilated in size.    4. The right ventricle is normal in size, with normal systolic function.    5. There is mild pulmonary hypertension.    12/09/21  Summary    1. The left ventricle is normal in size with severely increased wall  thickness.    2. The left ventricular systolic function is hyperdynamic, LVEF is visually  estimated at >70%.    3. There is grade III diastolic dysfunction (severely elevated filling  pressure).    4. The left atrium is moderately to severely dilated in size.    5. The right ventricle is mildly dilated in size, with normal systolic  function.    6. The right atrium is mildly dilated  in size.    7. IVC size and inspiratory change suggest elevated right atrial pressure.  (10-20 mmHg).    04/24/21   1. The left ventricle is normal in size with mildly increased wall  thickness.    2. The left ventricular systolic function is normal, LVEF is visually  estimated at > 55%.    3. There is grade II diastolic dysfunction (elevated filling pressure).    4. Mitral annular calcification is present (mild).    5. The left atrium is mildly dilated in size.    6. The right ventricle is mildly dilated in size, with low normal systolic  function.    7. IVC size and inspiratory change suggest elevated right atrial pressure.  (10-20 mmHg).    09/30/20  Summary    1. Limited study to assess systolic function.    2. IVC size and inspiratory change suggest normal right atrial pressure.  (0-5 mmHg).    3. The left ventricle is normal in size with mildly to moderately increased  wall thickness.    4. The left ventricular systolic function is normal with no obvious wall  motion abnormalities, LVEF is visually estimated at > 55%.    5. Mitral annular calcification is present.    6. The left atrium is mildly dilated in size.    7. The right ventricle is upper normal in size, with reduced systolic  Function.    Cardiac CT/MRI/Nuclear Tests:   None    6 Minute Walk:  None    Cardiopulmonary Stress Tests:   None               Other Past Medical History:  See below for the complete EPIC list of past medical and surgical history.      Allergies:  Nitrofurantoin and Lipitor  [atorvastatin ]    Current Medications:  Current Outpatient Medications   Medication Sig Dispense Refill    acetaminophen  (TYLENOL ) 500 MG tablet Take 2 tablets (1,000 mg total) by mouth Three (3) times a  day as needed for pain.      albuterol  HFA 90 mcg/actuation inhaler Inhale 2 puffs every eight (8) hours as needed for wheezing. (Patient not taking: Reported on 03/13/2024)      amiodarone  (PACERONE ) 200 MG tablet Take 1 tablet (200 mg total) by mouth daily. 30 tablet 11    apixaban  (ELIQUIS ) 2.5 mg Tab Take 1 tablet (2.5 mg total) by mouth two (2) times a day. 180 tablet 3    blood sugar diagnostic (ACCU-CHEK GUIDE TEST STRIPS) Strp Use to check blood sugar daily 50 strip 3    cranberry 500 mg cap Take 500 mg by mouth daily with evening meal.      cyclobenzaprine  (FLEXERIL ) 10 MG tablet Take 1 tablet (10 mg total) by mouth two (2) times a day as needed for muscle spasms. 60 tablet 11    estradioL  (ESTRACE ) 0.01 % (0.1 mg/gram) vaginal cream Insert 2 g into the vagina Two (2) times a week. (Patient not taking: Reported on 03/20/2024) 42.5 g 3    fluticasone  propion-salmeterol (ADVAIR  HFA) 115-21 mcg/actuation inhaler Inhale 2 puffs two (2) times a day. 12 g 11    fosfomycin (MONUROL ) 3 gram Pack Take 3 g by mouth once a week. 4 packet 6    gabapentin  (NEURONTIN ) 100 MG capsule Take 1 capsule (100 mg total) by mouth two (2) times a day. 60 capsule 11    lancets (ACCU-CHEK SOFTCLIX LANCETS) Misc CHECK BLOOD SUGAR DAILY 200 each 11    lidocaine  (ASPERCREME) 4 % patch Place 1 patch on the skin daily. Remove after 12 hours. 30 patch 2    methIMAzole  (TAPAZOLE ) 5 MG tablet Take 1 tablet (5 mg total) by mouth in the morning. 30 tablet 2    metOLazone  (ZAROXOLYN ) 5 MG tablet Take 1 tablet (5 mg total) by mouth daily as needed (up to 3x weekly when instructed by cardiology clinic).      midodrine  (PROAMATINE ) 5 MG tablet Take 1 tablet (5 mg total) by mouth three (3) times a day. 90 tablet 11    montelukast  (SINGULAIR ) 10 mg tablet Take 1 tablet (10 mg total) by mouth every morning. 90 tablet 2    NARCAN 4 mg/actuation nasal spray 1 spray into alternating nostrils once as needed (opioid overdose). PRN - Emergency use.      nystatin (MYCOSTATIN) 100,000 unit/gram powder Apply to affected area 3 times daily 30 g 1    oxyCODONE  (ROXICODONE ) 5 MG immediate release tablet Take 1 tablet (5 mg total) by mouth every eight (8) hours as needed for pain.      OXYGEN -AIR DELIVERY SYSTEMS MISC 5 L by Miscellaneous route. Currently using 3  L/min via [place]      pantoprazole  (PROTONIX ) 20 MG tablet TAKE 1 TABLET(20 MG) BY MOUTH DAILY 90 tablet 3    potassium chloride  20 MEQ ER tablet Take 1 tablet (20 mEq total) by mouth daily. 90 tablet 3    rosuvastatin  (CRESTOR ) 5 MG tablet TAKE 1 TABLET EVERY OTHER DAY 45 tablet 3    spironolactone  (ALDACTONE ) 25 MG tablet Take 0.5 tablets (12.5 mg total) by mouth daily. 30 tablet 2    tafamidis  (VYNDAMAX ) 61 mg cap Take 1 capsule (61 mg) by mouth daily. 30 capsule 11    tiotropium bromide  (SPIRIVA  RESPIMAT) 2.5 mcg/actuation inhalation mist Inhale 2 puffs daily. 4 g 11    torsemide  (DEMADEX ) 20 MG tablet Take 4 tablets (80 mg total) by mouth daily. 360 tablet 3  traZODone  (DESYREL ) 50 MG tablet Take 1 tablet (50 mg total) by mouth nightly as needed for sleep. 30 tablet 1    vutrisiran  (AMVUTTRA ) 25 mg/0.5 mL injection Inject 0.5 mL (25 mg total) under the skin once. Every 12 Weeks       No current facility-administered medications for this visit.       Family History:  The patient's family history includes Cancer in her father; Hypertension in her mother; No Known Problems in her brother, brother, maternal aunt, maternal grandfather, maternal grandmother, maternal uncle, paternal aunt, paternal grandfather, paternal grandmother, paternal uncle, sister, sister, and another family member.    Social history:  She  reports that she quit smoking about 6 years ago. Her smoking use included cigarettes. She has never used smokeless tobacco. She reports that she does not drink alcohol and does not use drugs.    Review of Systems:  As per HPI.  Rest of the review of ten systems is negative or unremarkable except as stated above.    Physical Exam:  VITAL SIGNS:   Vitals:    04/26/24 1333   BP: 132/70   Pulse: 69   SpO2: 98%     Wt Readings from Last 3 Encounters:   04/26/24 79.5 kg (175 lb 4.8 oz)   04/26/24 79.4 kg (175 lb)   03/13/24 83.3 kg (183 lb 11.2 oz)      Today's Body mass index is 27.41 kg/m??.   Height: 170.2 cm (5' 7)  CONSTITUTIONAL: well-appearing in no acute distress  EYES: Conjunctivae and sclerae clear and anicteric.  ENT: Benign.   CARDIOVASCULAR: JVP  1/2 up neck with HOB at 90degrees. Rate and rhythm are regular.  There is no lifts or heaves.  Normal S1, S2. There is no murmur, gallops or rubs.  Radial and pedal pulses are 2+, bilaterally.   There is 1+ L>R edema to ankles.   RESPIRATORY: Decreased breath sounds at bases.  There are no wheezes.  GASTROINTESTINAL: Soft, non-tender, with audible bowel sounds. Abdomen nondistended.  Liver is nonpalpable.  SKIN: No rashes, ecchymosis or petechiae.  Warm, well perfused.   MUSCULOSKELETAL:  no joint swelling   NEURO/PSYCH: Appropriate mood and affect. Alert and oriented to person, place, and time. No gross motor or sensory deficits evident.    Pertinent Laboratory Studies:   Office Visit on 04/26/2024   Component Date Value Ref Range Status    Sodium 04/26/2024 137  135 - 145 mmol/L Final    Potassium 04/26/2024 4.6  3.4 - 4.8 mmol/L Final    Chloride 04/26/2024 98  98 - 107 mmol/L Final    CO2 04/26/2024 26.3  20.0 - 31.0 mmol/L Final    Anion Gap 04/26/2024 13  5 - 14 mmol/L Final    BUN 04/26/2024 55 (H)  9 - 23 mg/dL Final    Creatinine 96/03/5408 2.58 (H)  0.55 - 1.02 mg/dL Final    BUN/Creatinine Ratio 04/26/2024 21   Final    eGFR [name]-EPI (2021) Female 04/26/2024 18 (L)  >=60 mL/min/1.38m2 Final    Glucose 04/26/2024 117  70 - 179 mg/dL Final    Calcium  04/26/2024 9.3  8.7 - 10.4 mg/dL Final    Albumin 81/19/1478 3.0 (L)  3.4 - 5.0 g/dL Final    Total Protein 04/26/2024 7.3  5.7 - 8.2 g/dL Final    Total Bilirubin 04/26/2024 0.4  0.3 - 1.2 mg/dL Final    AST 29/56/2130 22  <=34 U/L Final  ALT 04/26/2024 7 (L)  10 - 49 U/L Final    Alkaline Phosphatase 04/26/2024 112  46 - 116 U/L Final    PRO-BNP 04/26/2024 8,183.0 (H)  <=300.0 pg/mL Final    Magnesium  04/26/2024 2.6  1.6 - 2.6 mg/dL Final   Appointment on 04/17/2024   Component Date Value Ref Range Status    TSH 04/17/2024 0.155 (L)  0.550 - 4.780 uIU/mL Final    Free T4 04/17/2024 1.79 (H)  0.89 - 1.76 ng/dL Final    T3, Free 31/51/7616 2.18 (L)  2.30 - 4.20 pg/mL Final    WBC 04/17/2024 7.2  3.6 - 11.2 10*9/L Final    RBC 04/17/2024 3.98  3.95 - 5.13 10*12/L Final    HGB 04/17/2024 11.9  11.3 - 14.9 g/dL Final    HCT 07/37/1062 36.2  34.0 - 44.0 % Final    MCV 04/17/2024 91.0  77.6 - 95.7 fL Final    MCH 04/17/2024 29.8 25.9 - 32.4 pg Final    MCHC 04/17/2024 32.8  32.0 - 36.0 g/dL Final    RDW 69/48/5462 14.2  12.2 - 15.2 % Final    MPV 04/17/2024 8.1  6.8 - 10.7 fL Final    Platelet 04/17/2024 217  150 - 450 10*9/L Final    nRBC 04/17/2024 0  <=4 /100 WBCs Final    Neutrophils % 04/17/2024 71.6  % Final    Lymphocytes % 04/17/2024 14.9  % Final    Monocytes % 04/17/2024 11.0  % Final    Eosinophils % 04/17/2024 1.4  % Final    Basophils % 04/17/2024 1.1  % Final    Absolute Neutrophils 04/17/2024 5.2  1.8 - 7.8 10*9/L Final    Absolute Lymphocytes 04/17/2024 1.1  1.1 - 3.6 10*9/L Final    Absolute Monocytes 04/17/2024 0.8  0.3 - 0.8 10*9/L Final    Absolute Eosinophils 04/17/2024 0.1  0.0 - 0.5 10*9/L Final    Absolute Basophils 04/17/2024 0.1  0.0 - 0.1 10*9/L Final   Hospital Outpatient Visit on 03/20/2024   Component Date Value Ref Range Status    EKG Ventricular Rate 03/20/2024 117  BPM Final    EKG QRS Duration 03/20/2024 128  ms Final    EKG Q-T Interval 03/20/2024 370  ms Final    EKG QTC Calculation 03/20/2024 516  ms Final    EKG Calculated R Axis 03/20/2024 -82  degrees Final    EKG Calculated T Axis 03/20/2024 73  degrees Final    QTC Fredericia 03/20/2024 462  ms Final    EKG Ventricular Rate 03/20/2024 75  BPM Final    EKG Atrial Rate 03/20/2024 75  BPM Final    EKG P-R Interval 03/20/2024 200  ms Final    EKG QRS Duration 03/20/2024 118  ms Final    EKG Q-T Interval 03/20/2024 440  ms Final    EKG QTC Calculation 03/20/2024 491  ms Final    EKG Calculated P Axis 03/20/2024 65  degrees Final    EKG Calculated R Axis 03/20/2024 -70  degrees Final    EKG Calculated T Axis 03/20/2024 36  degrees Final    QTC Fredericia 03/20/2024 473  ms Final   Appointment on 03/20/2024   Component Date Value Ref Range Status    Thyroid Stimulating Immunoglob 03/20/2024 <1.0  <=1.3 TSI index Final    TSH 03/20/2024 0.027 (L)  0.550 - 4.780 uIU/mL Final    T3, Free 03/20/2024 3.17  2.30 - 4.20  pg/mL Final    Free T4 03/20/2024 2.44 (H) 0.89 - 1.76 ng/dL Final   Office Visit on 03/13/2024   Component Date Value Ref Range Status    EKG Ventricular Rate 03/13/2024 94  BPM Final    EKG QRS Duration 03/13/2024 132  ms Final    EKG Q-T Interval 03/13/2024 426  ms Final    EKG QTC Calculation 03/13/2024 532  ms Final    EKG Calculated R Axis 03/13/2024 -85  degrees Final    EKG Calculated T Axis 03/13/2024 75  degrees Final    QTC Fredericia 03/13/2024 494  ms Final    Sodium 03/13/2024 137  135 - 145 mmol/L Final    Potassium 03/13/2024 3.0 (L)  3.4 - 4.8 mmol/L Final    Chloride 03/13/2024 98  98 - 107 mmol/L Final    CO2 03/13/2024 25.9  20.0 - 31.0 mmol/L Final    Anion Gap 03/13/2024 13  5 - 14 mmol/L Final    BUN 03/13/2024 49 (H)  9 - 23 mg/dL Final    Creatinine 16/09/9603 1.97 (H)  0.55 - 1.02 mg/dL Final    BUN/Creatinine Ratio 03/13/2024 25   Final    eGFR [name]-EPI (2021) Female 03/13/2024 25 (L)  >=60 mL/min/1.38m2 Final    Glucose 03/13/2024 126  70 - 179 mg/dL Final    Calcium  03/13/2024 9.3  8.7 - 10.4 mg/dL Final    Albumin 54/08/8118 2.8 (L)  3.4 - 5.0 g/dL Final    Total Protein 03/13/2024 6.8  5.7 - 8.2 g/dL Final    Total Bilirubin 03/13/2024 0.5  0.3 - 1.2 mg/dL Final    AST 14/78/2956 23  <=34 U/L Final    ALT 03/13/2024 7 (L)  10 - 49 U/L Final    Alkaline Phosphatase 03/13/2024 194 (H)  46 - 116 U/L Final    Magnesium  03/13/2024 2.1  1.6 - 2.6 mg/dL Final    PRO-BNP 21/30/8657 15,373.0 (H)  <=300.0 pg/mL Final    WBC 03/13/2024 10.0  3.6 - 11.2 10*9/L Final    RBC 03/13/2024 4.28  3.95 - 5.13 10*12/L Final    HGB 03/13/2024 12.5  11.3 - 14.9 g/dL Final    HCT 84/69/6295 39.2  34.0 - 44.0 % Final    MCV 03/13/2024 91.6  77.6 - 95.7 fL Final    MCH 03/13/2024 29.3  25.9 - 32.4 pg Final    MCHC 03/13/2024 32.0  32.0 - 36.0 g/dL Final    RDW 28/41/3244 14.5  12.2 - 15.2 % Final    MPV 03/13/2024 8.8  6.8 - 10.7 fL Final    Platelet 03/13/2024 223  150 - 450 10*9/L Final    TSH 03/13/2024 0.032 (L)  0.550 - 4.780 uIU/mL Final    Free T4 03/13/2024 2.34 (H)  0.89 - 1.76 ng/dL Final   Appointment on 03/03/2024   Component Date Value Ref Range Status    Sodium 03/03/2024 140  135 - 145 mmol/L Final    Potassium 03/03/2024 3.6  3.4 - 4.8 mmol/L Final    Chloride 03/03/2024 99  98 - 107 mmol/L Final    CO2 03/03/2024 26.1  20.0 - 31.0 mmol/L Final    Anion Gap 03/03/2024 15 (H)  5 - 14 mmol/L Final    BUN 03/03/2024 48 (H)  9 - 23 mg/dL Final    Creatinine 12/30/7251 1.52 (H)  0.55 - 1.02 mg/dL Final    BUN/Creatinine Ratio 03/03/2024 32   Final  eGFR [name]-EPI (2021) Female 03/03/2024 34 (L)  >=60 mL/min/1.55m2 Final    Glucose 03/03/2024 109  70 - 179 mg/dL Final    Calcium  03/03/2024 9.5  8.7 - 10.4 mg/dL Final    Hemoglobin Z6X 03/03/2024 4.8  4.8 - 5.6 % Final    Estimated Average Glucose 03/03/2024 91  mg/dL Final    Vitamin D  Total (25OH) 03/03/2024 60.9  20.0 - 80.0 ng/mL Final   Office Visit on 01/26/2024   Component Date Value Ref Range Status    Sodium 01/26/2024 138  135 - 145 mmol/L Final    Potassium 01/26/2024 4.1  3.4 - 4.8 mmol/L Final    Chloride 01/26/2024 96 (L)  98 - 107 mmol/L Final    CO2 01/26/2024 29.3  20.0 - 31.0 mmol/L Final    Anion Gap 01/26/2024 13  5 - 14 mmol/L Final    BUN 01/26/2024 64 (H)  9 - 23 mg/dL Final    Creatinine 09/60/4540 2.03 (H)  0.55 - 1.02 mg/dL Final    BUN/Creatinine Ratio 01/26/2024 32   Final    eGFR [name]-EPI (2021) Female 01/26/2024 24 (L)  >=60 mL/min/1.84m2 Final    Glucose 01/26/2024 121  70 - 179 mg/dL Final    Calcium  01/26/2024 9.8  8.7 - 10.4 mg/dL Final    Magnesium  01/26/2024 2.3  1.6 - 2.6 mg/dL Final    PRO-BNP 98/10/9146 7,489.0 (H)  <=300.0 pg/mL Final    WBC 01/26/2024 7.8  3.6 - 11.2 10*9/L Final    RBC 01/26/2024 3.61 (L)  3.95 - 5.13 10*12/L Final    HGB 01/26/2024 10.7 (L)  11.3 - 14.9 g/dL Final    HCT 82/95/6213 32.0 (L)  34.0 - 44.0 % Final    MCV 01/26/2024 88.6  77.6 - 95.7 fL Final    MCH 01/26/2024 29.6  25.9 - 32.4 pg Final    MCHC 01/26/2024 33.4  32.0 - 36.0 g/dL Final RDW 08/65/7846 96.2  12.2 - 15.2 % Final    MPV 01/26/2024 8.6  6.8 - 10.7 fL Final    Platelet 01/26/2024 217  150 - 450 10*9/L Final       Lab Results   Component Value Date    PRO-BNP 8,183.0 (H) 04/26/2024    PRO-BNP 15,373.0 (H) 03/13/2024    PRO-BNP 7,489.0 (H) 01/26/2024    Creatinine 2.58 (H) 04/26/2024    Creatinine 1.97 (H) 03/13/2024    Creatinine 0.79 01/06/2011    BUN 55 (H) 04/26/2024    BUN 49 (H) 03/13/2024    BUN 20 01/06/2011    Sodium 137 04/26/2024    Sodium 140 01/06/2011    Potassium 4.6 04/26/2024    Potassium 4.1 01/06/2011    CO2 26.3 04/26/2024    CO2 30 01/06/2011    Magnesium  2.6 04/26/2024    Magnesium  2.0 01/06/2011    Total Bilirubin 0.4 04/26/2024    INR 1.04 09/24/2023    INR 1.0 01/06/2011       Lab Results   Component Value Date    Digoxin Level 0.9 10/01/2020       Lab Results   Component Value Date    TSH 0.155 (L) 04/17/2024    Cholesterol, Total 95 12/06/2021    Triglycerides 66 12/06/2021    Cholesterol, HDL 42 12/06/2021    Cholesterol, Non-HDL, Calculated 53 (L) 12/06/2021    Cholesterol, LDL, Calculated 40 12/06/2021       Lab Results   Component Value Date  WBC 7.2 04/17/2024    WBC 12.5 (H) 01/06/2011    HGB 11.9 04/17/2024    HGB 12.9 01/06/2011    HCT 36.2 04/17/2024    HCT 40.8 01/06/2011    Platelet 217 04/17/2024    Platelet 260 01/06/2011       Pertinent Test Results from Today:  None    Other pertinent records were reviewed.    The following are further history from the patient's EPIC record for reference:     Past Medical History:   Diagnosis Date    Acute exacerbation of CHF (congestive heart failure) 08/19/2023    Acute kidney injury superimposed on chronic kidney disease 10/11/2020    Acute on chronic diastolic (congestive) heart failure 08/23/2020    Acute on chronic diastolic congestive heart failure 08/23/2020    AKI (acute kidney injury) 04/14/2015    Lab Results  Component  Value  Date     CREATININE  1.90 (H)  06/12/2021     Had a bump in her creatinine when she was taking her diuretics every day.  She is currently taking 40 mg daily of torsemide  and 50 mg of spironolactone .  Her volume status is fragile.  Previously when she stopped her diuretic she becomes short of breath.  Plan: We will check her BMP today.  We will likely have to go to 40    Anemia 08/22/2019    Iron  deficiency anemia          Lab Results      Component    Value    Date           WBC    9.2    03/21/2021           RBC    3.67 (L)    03/21/2021           HGB    9.9 (L)    03/21/2021           HCT    30.7 (L)    03/21/2021           MCV    83.7    03/21/2021           MCH    26.9    03/21/2021           MCHC    32.1    03/21/2021           RDW    21.9 (H)    03/21/2021           PLT    318     Arthritis     Calculus of kidney     Calculus of ureter     CHF (congestive heart failure)     Chronic atrial fibrillation 07/20/2019    COPD (chronic obstructive pulmonary disease)     Diabetes     Gangrenous cholecystitis 10/11/2020    Generalized edema  06/17/2021    GERD (gastroesophageal reflux disease)     Hydronephrosis     Hypertension     Hyponatremia 10/11/2020    Intermediate coronary syndrome 03/13/2014    Lower extremity edema 09/28/2020    Lumbar stenosis     Microscopic hematuria     Nausea alone     Nephrolithiasis 04/17/2016    Neuropathy     Nocturia     Nocturia 07/01/2017    Other chronic cystitis     Persistent fatigue after COVID-19 06/03/2021    Patient with some  fatigue.  See plans for anemia, AKI  We are also tapering her gabapentin .  Currently on 300 mg 3 times daily.  We will decrease it to twice daily, and then nightly.  She states she is not having any recurrence of her pain.    Pulmonary hypertension     Renal colic     Shortness of breath 04/28/2020    Sleep apnea     Unstable angina pectoris 03/13/2014       Past Surgical History:   Procedure Laterality Date    BACK SURGERY  1995    CARPAL TUNNEL RELEASE Left 2014    HYSTERECTOMY  1971    IR INSERT CHOLECYSTOSTOMY TUBE PERCUTANEOUS  10/02/2020    IR INSERT CHOLECYSTOSMY TUBE PERCUTANEOUS 10/02/2020 Judeth Notch, MD IMG [facility]IR [facility]    LUMBAR DISC SURGERY      PR REMOVAL GALLBLADDER N/A 10/06/2020    Procedure: CHOLECYSTECTOMY;  Surgeon: True Fuss, MD;  Location: MAIN OR [facility];  Service: Trauma    PR RIGHT HEART CATH O2 SATURATION & CARDIAC OUTPUT N/A 09/30/2020    Procedure: Right Heart Catheterization;  Surgeon: Penne Bowl, MD;  Location: [facility] C[facility];  Service: Cardiology    PR RIGHT HEART CATH O2 SATURATION & CARDIAC OUTPUT N/A 01/09/2021    Procedure: Right Heart Catheterization;  Surgeon: Coralie Derrick, MD;  Location: [facility] CATH;  Service: Cardiology

## 2024-04-26 NOTE — Unmapped (Signed)
 Patient presents for Amvuttra  (vutrisiran ) injection.  VSS. Education material given to patient and educated on potential side effects, patient verbalizes understanding.  Patient presents with her daughter and on 3 liters oxygen  continuous via [place]. Patient denies any medical changes or new concerns today.    1524 Amvuttra  25 mg administered subcutaneously to right upper arm, band-aid applied to site.  Patient tolerated with no issues.  Patient discharged from infusion clinic with 12 week appt to coincide with cardiology appointment.

## 2024-05-08 NOTE — Unmapped (Signed)
 [facility] Refill Coordination Note    Specialty Medication(s) to be Shipped:   Cardiology: Vyndamax     Other medication(s) to be shipped: No additional medications requested for fill at this time     Barbara Huber, DOB: [birth year]  Phone: [phone] (home)       All above HIPAA information was verified with patient.     Was a Nurse, learning disability used for this call? No    Completed refill call assessment today to schedule patient's medication shipment from the [facility]  [phone]).  All relevant notes have been reviewed.     Specialty medication(s) and dose(s) confirmed: Regimen is correct and unchanged.   Changes to medications: Barbara Huber reports no changes at this time.  Changes to insurance: No  New side effects reported not previously addressed with a pharmacist or physician: None reported  Questions for the pharmacist: No    Confirmed patient received a Conservation officer, historic buildings and a Surveyor, mining with first shipment. The patient will receive a drug information handout for each medication shipped and additional FDA Medication Guides as required.       DISEASE/MEDICATION-SPECIFIC INFORMATION        N/A    SPECIALTY MEDICATION ADHERENCE     Medication Adherence    Patient reported X missed doses in the last month: 0  Specialty Medication: Vyndamax  61mg   Patient is on additional specialty medications: No              Were doses missed due to medication being on hold? No    Vyndamax  61 mg: 10 days of medicine on hand     REFERRAL TO PHARMACIST     Referral to the pharmacist: Not needed      [facility]     Shipping address confirmed in Epic.     Cost and Payment: Patient has a $0 copay, payment information is not required.    Delivery Scheduled: Yes, Expected medication delivery date: 05/11/24.     Medication will be delivered via Next Day Courier to the prescription address in Epic WAM.    Barbara Huber, [facility]   [facility] Specialty Pharmacist

## 2024-05-09 MED ORDER — CYCLOBENZAPRINE 5 MG TABLET
ORAL_TABLET | 0 refills | 0.00000 days
Start: 2024-05-09 — End: ?

## 2024-05-09 NOTE — Unmapped (Signed)
 Was approved last week, please deny

## 2024-05-10 MED ORDER — CYCLOBENZAPRINE 5 MG TABLET
ORAL_TABLET | Freq: Two times a day (BID) | ORAL | 11 refills | 30.00000 days | Status: CP | PRN
Start: 2024-05-10 — End: 2025-05-10

## 2024-05-10 MED ORDER — ERGOCALCIFEROL (VITAMIN D2) 1,250 MCG (50,000 UNIT) CAPSULE
ORAL_CAPSULE | ORAL | 0 refills | 0.00000 days | Status: CP
Start: 2024-05-10 — End: ?

## 2024-05-10 MED FILL — VYNDAMAX 61 MG CAPSULE: ORAL | 30 days supply | Qty: 30 | Fill #5

## 2024-05-15 ENCOUNTER — Ambulatory Visit
Admission: RE | Admit: 2024-05-15 | Discharge: 2024-05-15 | Disposition: A | Discharge status: Home / Self Care | Source: Ambulatory Visit | Attending: Anesthesiology | Admitting: Anesthesiology

## 2024-05-15 ENCOUNTER — Other Ambulatory Visit: Payer: Self-pay | Admitting: Anesthesiology

## 2024-05-15 DIAGNOSIS — R52 Pain, unspecified: Secondary | ICD-10-CM

## 2024-05-15 DIAGNOSIS — W19XXXA Unspecified fall, initial encounter: Secondary | ICD-10-CM

## 2024-05-23 DIAGNOSIS — E0590 Thyrotoxicosis, unspecified without thyrotoxic crisis or storm: Principal | ICD-10-CM

## 2024-05-23 MED ORDER — METHIMAZOLE 5 MG TABLET
ORAL_TABLET | Freq: Every day | ORAL | 0 refills | 0.00000 days
Start: 2024-05-23 — End: ?

## 2024-05-24 MED ORDER — METHIMAZOLE 5 MG TABLET
ORAL_TABLET | Freq: Every day | ORAL | 0 refills | 30.00000 days
Start: 2024-05-24 — End: 2025-05-24

## 2024-05-31 ENCOUNTER — Encounter: Admit: 2024-05-31 | Discharge: 2024-06-01 | Payer: Medicare (Managed Care)

## 2024-05-31 DIAGNOSIS — N184 Chronic kidney disease, stage 4 (severe): Principal | ICD-10-CM

## 2024-05-31 DIAGNOSIS — I5032 Chronic diastolic (congestive) heart failure: Principal | ICD-10-CM

## 2024-05-31 DIAGNOSIS — E1122 Type 2 diabetes mellitus with diabetic chronic kidney disease: Principal | ICD-10-CM

## 2024-05-31 DIAGNOSIS — E0590 Thyrotoxicosis, unspecified without thyrotoxic crisis or storm: Principal | ICD-10-CM

## 2024-05-31 DIAGNOSIS — M8000XD Age-related osteoporosis with current pathological fracture, unspecified site, subsequent encounter for fracture with routine healing: Principal | ICD-10-CM

## 2024-05-31 DIAGNOSIS — Z794 Long term (current) use of insulin: Principal | ICD-10-CM

## 2024-05-31 NOTE — Unmapped (Signed)
 [facility] Internal Medicine at [facility]     Are you located in ? yes    Reason for visit: Follow up    Questions / Concerns that need to be addressed:     PTHomeBP     HCDM reviewed and updated in Epic:    We are working to make sure all of our patients??? wishes are updated in Epic and part of that is documenting a Environmental health practitioner for each patient  A Health Care Decision Maker is someone you choose who can make health care decisions for you if you are not able - who would you most want to do this for you????  was updated.    HCDM (With Legal Document To Support): [name],[name] - Daughter - [phone]    BPAs completed:  Falls Risk - adults 65+    __________________________________________________________________________________________    SCREENINGS COMPLETED IN FLOWSHEETS      AUDIT       PHQ2       PHQ9          GAD7       COPD Assessment       Falls Risk

## 2024-05-31 NOTE — Unmapped (Signed)
 Internal Medicine Clinic Visit  Person Contacted: Patient  Contact Phone number: [phone] (home)   Is there someone else in the room? Yes. What is your relationship? daughter. Do you want this person here for the visit? yes.  Reason for visit: follow up    A/P:  Assessment & Plan  Congestive Heart Failure  On torsemide  and spironolactone  for fluid management. Weight decreased from 183 pounds in March to 173-174 pounds  Fluid status requires monitoring.  - Continue torsemide  80 mg daily  - Continue spironolactone   - Monitor weight and fluid status  - BMP next week    Chronic Kidney Disease  Slightly elevated kidney function at the end of April. Monitoring is crucial due to potential fluid retention and medication effects. Reassess kidney function during upcoming visit.  - Order kidney function test during visit with Doctor LaVarge on June 14th    COPD  On 3 liters of oxygen  at home. Breathing is reported as good, indicating well-managed respiratory status.  Plan: Follow-up with pulmonology  Diabetes Mellitus  Well-controlled with no need for insulin  therapy. Last A1c was good, indicating stable blood glucose levels.    Hyperthyroidism  Currently taking thyroid medication (metamizole), due to amiodarone  induced hyperthyroidism. It is time to repeat thyroid function tests to assess current levels and determine if medication adjustments are needed. No more refills on the thyroid medication; will reassess after test results.  - Order thyroid function test during visit with Doctor LaVarge on June 14th    Poor Appetite  Reports lack of appetite, affecting nutritional intake. Encouraged to eat three meals a day with snacks to maintain strength. Advised to include protein-rich foods and avoid excessive salt intake.  - Encourage eating three meals a day with snacks  - Avoid excessive salt intake  - Include protein-rich foods like eggs    General Health Maintenance  Declined bone density scan for osteoporosis due to discomfort with the procedure. Vitamin D  levels were checked in March and are adequate.    Follow-up  - Follow-up with Doctor Tura Gaines on June 14th for blood tests  - Schedule follow-up with m e in September  - Follow-up with Para Bold in August         Health Maintenance    Return in about 3 months (around 08/31/2024).    HPI:   Pt is a 83 y.o. female with a history of  COPD on 3L home [place], CHF, [name], [name], T2DM , TTR amyloid  Lab Results   Component Value Date    TSH 0.155 (L) 04/17/2024      Weight: 173/174  Lab Results   Component Value Date    VITDTOTAL 60.9 03/03/2024    VITDTOTAL 33.7 07/14/2023    VITDTOTAL 7.1 (L) 02/16/2023         History of Present Illness  Barbara Huber is an 83 year old female with COPD who presents for a routine follow-up. She is accompanied by her daughter, Barbara Huber.    She feels generally well and her breathing is stable on three liters of oxygen  at home for her COPD. No significant swelling in her legs. Her current medications include torsemide  80 mg daily and half a dose of spironolactone . She is not taking metolazone . Her weight is around 173-174 pounds, down from 183 pounds in March. She and her family report that she had a stomach virus in March, after which she lost some weight and her appetite was affected.    She is not currently  taking any diabetes medication, including insulin , and her last A1c was reported to be good. She confirms normal urination.    She reports a poor appetite, stating 'I got no appetite,' but she tries to eat three meals a day, with breakfast being the most substantial meal. She likes eggs.    Her blood pressure readings at home are stable, with recent measurements of 106/65 and 111/66.    She has not had a bone density scan for osteoporosis and expresses reluctance to undergo the procedure due to discomfort when lying flat.    Her vitamin D  level was checked in March and was reported to be good.      Problem List:  Patient Active Problem List Diagnosis    Spinal stenosis of lumbar region    Thoracic or lumbosacral neuritis or radiculitis    Lumbosacral spondylosis    COPD (chronic obstructive pulmonary disease)      Sleep apnea in adult    Essential hypertension    Type 2 diabetes mellitus with stage 4 chronic kidney disease, with long-term current use of insulin       Chronic cystitis    Incomplete bladder emptying    Peripheral neuropathy    Chronic respiratory failure with hypoxia      Persistent atrial fibrillation      Anemia in chronic kidney disease    Chronic kidney disease ([name]), stage IV (severe)      Pulmonary hypertension      Chronic prescription opiate use    Dyspnea on exertion    Chronic heart failure with preserved ejection fraction      Cardiac amyloidosis      Recurrent cold sores    Dyspepsia    Iron  deficiency anemia due to chronic blood loss    Neuropathy    Left hip pain    Familial amyloid heart disease      Hereditary amyloidosis      Osteoporosis with current pathological fracture    Troponin level elevated    Thrombocytopenia    Difficulty walking       Medications:  Reviewed in EPIC    Social history:   Lives with daughter    Physical Exam:   Vital Signs:  BP Readings from Last 3 Encounters:   04/26/24 133/62   04/26/24 132/70   03/20/24 118/64      Wt Readings from Last 3 Encounters:   05/31/24 87.5 kg (193 lb)   04/26/24 79.5 kg (175 lb 4.8 oz)   04/26/24 79.4 kg (175 lb)      General appearance -   looks well wearing oxygen     Records review  Lab Results   Component Value Date    A1C 4.8 03/03/2024        Health Maintenance Due   Topic Date Due    COVID-19 Vaccine (5 - 2024-25 season) 08/29/2023      The ASCVD Risk score (Arnett DK, et al., 2019) failed to calculate.     Medication adherence and barriers to the treatment plan have been addressed. Opportunities to optimize healthy behaviors have been discussed. Patient / caregiver voiced understanding.      The patient reports they are physically located in [place]  and is currently: at home. I conducted a audio/video visit. I spent  53m 38s on the video call with the patient. I spent an additional 15 minutes on pre- and post-visit activities on the date of service .

## 2024-06-01 NOTE — Unmapped (Signed)
 Called/spoke with patient to schedule 3 month follow up with PCP. Appointment scheduled

## 2024-06-03 DIAGNOSIS — I5032 Chronic diastolic (congestive) heart failure: Principal | ICD-10-CM

## 2024-06-03 MED ORDER — TORSEMIDE 20 MG TABLET
ORAL_TABLET | Freq: Every day | ORAL | 3 refills | 90.00000 days
Start: 2024-06-03 — End: 2025-06-03

## 2024-06-05 MED ORDER — TORSEMIDE 20 MG TABLET
ORAL_TABLET | Freq: Every day | ORAL | 3 refills | 90.00000 days | Status: CP
Start: 2024-06-05 — End: 2025-06-05

## 2024-06-07 NOTE — Unmapped (Signed)
 [employer]  at [facility]  Pulmonary Hypertension Program                        Cassell Cliche, MD--Director (Pulmonary)  Randy Buttery. Tura Gaines, MD--Associate Director (Pulmonary)  Delberta Fee, MD (Cardiology)    Gerome Koyanagi, RN Nurse Coordinator  Ermelinda Hazard, RN Nurse Coordinator    [phone] office  [phone] fax             Ms. Chaylee Ehrsam  is a 83 y.o. female patient referred for work up and evaluation for pulmonary hypertension, COPD, and chronic hypoxemic respiratory failure.    Assessment:      Eupha Schweigert is a 83 y.o.female with COPD and chronic hypoxemic respiratory failure. She was previously referred to me for pulmonary hypertension, though this is now clearly dominant left heart/group 2 pathology (with small precapillary component, either secondary to [name] versus COPD).     Overall had been doing pretty well from a COPD standpoint lately, though noting more DOE over the last month, not obviously having issues with fluid or rhythms, pulmonary exam remains reassuring without wheezing or marked change in cough though she is due for updated spirometry. Recently having issues with hyperthyroidism on methimazole  and thus possible hypo or hyperthyroidism could be playing a role. JVD remains elevated though not much edema, could consider being more aggressive with metolazone  dosing. Remains on daytime oxygen  and NIPPV every night.  With lack of exacerbations recently, we trialed change to LABA/LAMA with Stiolto, but did not tolerate this, changed back to Spiriva /Advair , does well with fully non-DPI plan.       Plan:       - Labs today, including repeat proBNP, chem panel, TFTs.  - Will set up repeat FVL with next visit.  - Continue Advair  HFA and Spiriva  Respimat. Did not do well with previous trial of LABA/LAMA only (side effects with Stiolto), so will leave unchanged for now.   - Sending in Rx for albuterol  for PRN use to have on hand and also see if this might help symptoms now with more DOE noted.  - Continue oxygen  at 3LPM including at night. Continue Trilogy/O2 at night. She is benefiting from AVAPS settings.   - Previous discussion re: formal PR, can continue to discuss in future if patient interested.  - Close followup with heart failure team.  - Depending on labs, will consider dosing metolazone  to see if this helps symptoms.     Followup in 3 months, with FVL.    I personally spent 46 minutes face-to-face and non-face-to-face in the care of this patient, which includes all pre, intra, and post visit time on the date of service.  All documented time was specific to the E/M visit and does not include any procedures that may have been performed.          Tish Forge, MD  06/08/24  Subjective:        83 y.o. 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 Jamal Mays. Recalls COPD diagnosis dating back more than 10 years ago, no history of asthma or other lung conditions. Overall it sounds like she has been maintained on triple inhaler therapy (Advair  diskus, Spiriva  handihaler), with exacerbations averaging maybe once/year, usually not requiring hospitalization. Albuterol  PRN via MDI (tries to use BID by routine) or neb (rare use). In 06/2019 she was admitted to [facility] with left sided pneumonia, also treated for COPD exacerbation with steroids during this, course also complicated by AKI and Afib/RVR. Hypercapnic during admission and started on Trilogy for discharge. She also carries diagnosis of CHF, made a few years ago, at least 2 admissions (usually [facility]) for diuresis. With recent admission did have very elevated proBNPs and discharged on higher dose of Lasix  (40 mg daily). Denies recent edema on this higher dose of diuretic. No orthopnea. She has not been doing a lot of physical activity during COVID, will notice very mild dyspnea with self care, no exertional CP or LH. Does find neuropathy can hinder her activity as well. Occasional cough when not having exacerbation, clear sputum. Did have hemoptysis with pneumonia, none outside of this. With first visit we discussed recommendation to repeat CXR, and also repeat TTE in stable outpatient state.    Subsequent history: At visit 10/2019 doing okay, occasional episodes of DOE but overall happy with level of function. Discussed potential for clinical trial enrollment, but she was feeling very well and did not want to do invasive testing unless changing. Admitted to [facility] 04/2020 for dyspnea and decompensated heart failure, diuresed and also given IV iron . At followup 06/2020 not quite back at baseline, but improving steadily. She was then admitted 07/2020 again for heart failure, and then again in 09/2020 for two weeks with decompensated heart failure, Afib/RVR, septic shock, and gangrenous cholecystitis. Shortly thereafter she was diagnosed with ATTR cardiac amyloid. Admitted 12/2020 with dyspnea and decompensated heart failure, diuresed and underwent DCCV. At followup that month slowing improving since discharge, though more hypoxemic than baseline, discussed anemia/iron  and changed inhalers to non-DPI formulations. At followup 01/2021 feeling much better, more active, started tafamidis , no additional changes made. At followup 04/2021 doing well, spirometry improved, no changes made. At followup 10/2021 doing okay, no changes. At followup 05/2022 doing well, brief recent ED stay for low sats which was due to  malfunction, changed Advair /Spiriva  to Stiolto to get rid of ICS, though this was ultimately not tolerated and went back to Advair /Spiriva . At followup 09/2022 visit early mainly for oxygen  need documentation, doing very well, no other changes. At followup 03/2023 doing well aside from fall with pelvic fracture in interim, creatinine continuing to increase, no changes from COPD standpoint. At followup 05/2024 noting more DOE over the last month, unclear etiology though volume not markedly increased, checking labs.    Interval history 05/2024: Since last visit  More dyspnea over the last month, with getting around and doing anything  Hard to know if more fluid, has not tried extra metolazone  in a month or so  Interim admissions during 07/2023, 08/2023, 10/2023  Nothing in the last 6 months for hospitalizations  Two interim cardioversions for Afib  Midodrine  5 mg TID also now on board  Torsemide  80 mg daily, plus spiro, metolazone  not often  Weight  loss steadily in interim, appetite not great  Amvuttra  also started for amyloid, for last year  Also now on methimazole  for hyperthyroid  Unchanged oxygen  (now on CF tanks for portability) and Trilogy       Past medical, past surgical, family, and social histories reviewed and updated in Epic.    Review of systems is significant for:   A 12 point review of systems was negative except for pertinent items noted in the HPI.    Allergies   Allergen Reactions    Nitrofurantoin Anaphylaxis and Other (See Comments)    Lipitor  [Atorvastatin ] Muscle Pain     SOB, Headache, fatigue, sick on my stomach      Current Outpatient Medications   Medication Sig Dispense Refill    acetaminophen  (TYLENOL ) 500 MG tablet Take 2 tablets (1,000 mg total) by mouth Three (3) times a day as needed for pain.      albuterol  HFA 90 mcg/actuation inhaler Inhale 2 puffs every eight (8) hours as needed for wheezing.      amiodarone  (PACERONE ) 200 MG tablet Take 1 tablet (200 mg total) by mouth daily. 30 tablet 11    apixaban  (ELIQUIS ) 2.5 mg Tab Take 1 tablet (2.5 mg total) by mouth two (2) times a day. 180 tablet 3    blood sugar diagnostic (ACCU-CHEK GUIDE TEST STRIPS) Strp Use to check blood sugar daily 50 strip 3    cranberry 500 mg cap Take 500 mg by mouth daily with evening meal.      cyclobenzaprine  (FLEXERIL ) 10 MG tablet Take 1 tablet (10 mg total) by mouth two (2) times a day as needed for muscle spasms. 60 tablet 11    cyclobenzaprine  (FLEXERIL ) 5 MG tablet Take 1.5 tablets (7.5 mg total) by mouth two (2) times a day as needed for muscle spasms. 90 tablet 11    ergocalciferol -1,250 mcg, 50,000 unit, (DRISDOL ) 1,250 mcg (50,000 unit) capsule TAKE 1 CAPSULE ONCE A WEEK 12 capsule 0    estradioL  (ESTRACE ) 0.01 % (0.1 mg/gram) vaginal cream Insert 2 g into the vagina Two (2) times a week. 42.5 g 3    fluticasone  propion-salmeterol (ADVAIR  HFA) 115-21 mcg/actuation inhaler Inhale 2 puffs two (2) times a day. 12 g 11    fosfomycin (MONUROL ) 3 gram Pack Take 3 g by mouth once a week. 4 packet 6    gabapentin  (NEURONTIN ) 100 MG capsule Take 1 capsule (100 mg total) by mouth two (2) times a day. 60 capsule 11    lancets (ACCU-CHEK SOFTCLIX LANCETS) Misc CHECK BLOOD SUGAR DAILY 200 each 11    lidocaine  (ASPERCREME) 4 % patch Place 1 patch on the skin daily. Remove after 12 hours. 30 patch 2    methIMAzole  (TAPAZOLE ) 5 MG tablet Take 1 tablet (5 mg total) by mouth in the morning. 30 tablet 2    metOLazone  (ZAROXOLYN ) 5 MG tablet Take 1 tablet (5 mg total) by mouth daily as needed (up to 3x weekly when instructed by cardiology clinic).      midodrine  (PROAMATINE ) 5 MG tablet Take 1 tablet (5 mg total) by mouth three (3) times a day. 90 tablet 11    montelukast  (SINGULAIR ) 10 mg tablet Take 1 tablet (10 mg total) by mouth every morning. 90 tablet 2    NARCAN 4 mg/actuation nasal spray 1 spray into alternating nostrils once as needed (opioid overdose). PRN - Emergency use.      nystatin  (MYCOSTATIN ) 100,000 unit/gram powder Apply to affected area 3 times  daily 30 g 1    oxyCODONE  (ROXICODONE ) 5 MG immediate release tablet Take 1 tablet (5 mg total) by mouth every eight (8) hours as needed for pain.      OXYGEN -AIR DELIVERY SYSTEMS MISC 5 L by Miscellaneous route. Currently using 3  L/min via [place]      pantoprazole  (PROTONIX ) 20 MG tablet TAKE 1 TABLET(20 MG) BY MOUTH DAILY 90 tablet 3    potassium chloride  20 MEQ ER tablet Take 1 tablet (20 mEq total) by mouth daily. 90 tablet 3    rosuvastatin  (CRESTOR ) 5 MG tablet TAKE 1 TABLET EVERY OTHER DAY 45 tablet 3    spironolactone  (ALDACTONE ) 25 MG tablet Take 0.5 tablets (12.5 mg total) by mouth daily. 30 tablet 2    tafamidis  (VYNDAMAX ) 61 mg cap Take 1 capsule (61 mg) by mouth daily. 30 capsule 11    tiotropium bromide  (SPIRIVA  RESPIMAT) 2.5 mcg/actuation inhalation mist Inhale 2 puffs daily. 4 g 11    torsemide  (DEMADEX ) 20 MG tablet Take 4 tablets (80 mg total) by mouth daily. 360 tablet 3    traZODone  (DESYREL ) 50 MG tablet Take 1 tablet (50 mg total) by mouth nightly as needed for sleep. 30 tablet 1    vutrisiran  (AMVUTTRA ) 25 mg/0.5 mL injection Inject 0.5 mL (25 mg total) under the skin once. Every 12 Weeks       No current facility-administered medications for this visit.          Objective:   Objective     Physical Exam:   BP 116/69 (BP Site: L Arm, BP Position: Sitting, BP Cuff Size: Large)  - Pulse 68  - Temp 36.3 ??C (97.3 ??F) (Temporal)  - Wt 79.4 kg (175 lb)  - LMP  (LMP Unknown)  - SpO2 99%  - BMI 27.40 kg/m??   Body mass index is 27.4 kg/m??.    SpO2 at rest 94% 3LPM    Wt Readings from Last 6 Encounters:   06/08/24 79.4 kg (175 lb)   05/31/24 87.5 kg (193 lb)   04/26/24 79.5 kg (175 lb 4.8 oz)   04/26/24 79.4 kg (175 lb)   03/13/24 83.3 kg (183 lb 11.2 oz)   02/21/24 81.6 kg (180 lb)     General Appearance:    Alert, cooperative, no distress, appears stated age.   HEENT:    Normocephalic, without obvious abnormality, atraumatic. PERRL, conjunctiva clear. Nares normal, no drainage, no sinus tenderness. Oropharynx with no lesions. No facial telangectasias.   Neck:    Supple, trachea midline, no adenopathy.    Lungs:    Distant breath sounds b/l, no crackles or wheezes, respirations unlabored. No PA bruits.   Chest wall:   Expands symmetrically, no tenderness or deformity.   Cardiovascular:   Regular rate and rhythm, S1 and S2 normal, P2 not overly prominent, no murmur, rubs, or gallop appreciated. Neck exam with prominent carotid impulse, I do see some respiratory variation >12cm ASA though unsure that definitely venous.   Abdomen:   Soft, non-tender, normal bowel sounds, no masses, no organomegaly   Extremities:     Extremities normal, no cyanosis, clubbing. Trace to 1+ pitting edema, similar. No sclerodactyly.   Skin:   No rashes or lesions.   Neuro/psych:      No focal neurologic deficits; mood/affect normal.     Diagnostic Review:    Cardiac testing summary:  RHC/ICU PAC 09/25/23: RA 18, PA 70/40, PCW 28  TTE 08/19/23: LA mod dil, EF >70%  RV normal, tr TR, can't est PASP       No effusion  TTE 01/2023 : LA mod dil, EF >70%       RV normal, est PASP 46       No effusion. IVC normal  TTE 11/2021: EF >70%, gr 3DD, sev LVH       RV mild dil, nl fxn       No effusion. IVC dil/poor insp collapse  TTE 03/2021:   RHC 01/09/21: RA 23, PA 85/40 (55), PCW 32       CO/CI 5.3/2.5 (f,td), PA sat 57%, PVR 4.2 WU  TTE 09/2020: LA mild dil, EF >55%       RV ULN size, HK, tr TR, can't est PASP       No effusion. IVC dil, poor insp collapse  TTE 04/2020: LA 41, EF >55%, LVH (16)       RV normal, mild HK, TAPSE 25, can't est PASP       No effusion. IVC normal.  TTE 10/2019: LA 42, EF >55%       RV mild dil/HK, can't est PASP       No effusion. IVC dil, poor insp collapse  TTE 06/2019: LA 38, EF 65-70%, grade 2 DD, LVH (13-14)       RV mild dil, low nl fxn, TAPSE 17, mild-mod Tr, est PASP 64+CVP       Tr effusion. IVC nl size, poor insp collapse  TTE 04/2018 (cone): LA 40, EF 60-65%, grade 1 DD       RV normal, mild TR, est PASP within the normal range       No effusion. IVC normal    CXR 09/23/23 (reviewed): small bilat effusions       Moderate pulmonary edema, cardiomegaly  CXR 01/2023: small effusions       Stable cardiomegaly. Bilat lower lung opacities, similar  CXR 04/2022: Similar bilat mid and lower lung opacities.        Small bilateral pleural effusions.        Stable enlarged cardiac silhouette.   CXR 08/2020: cardiomegaly       Pulmonary vascular congestion with increased markings       Hyperinflated lungs, flat diagphragms  CXR 04/2020: cardiomegaly       Hazy interstitial opacities with vascular congestion       Likely trace L effusion.  CXR 08/2019:  stable cardiomegaly       Interval resolution of LLL consolidation.       Stable parenchymal scarring in the left midlung zone.       Interval decreased now trace left pleural effusion.    CTA chest 10/2018 (cone): Cardiovascular: There is no demonstrable pulmonary embolus. There is  no thoracic aortic aneurysm or dissection. There is calcification at  the origin of the left subclavian artery. Other visualized great  vessels appear unremarkable. There is aortic atherosclerosis. There  are foci of coronary artery calcification evident. There is a small  amount of pericardial fluid, within the physiologic range, stable.  Pericardium does not appear appreciably thickened.  Prominence of the main pulmonary outflow tract is noted with a  measured diameter of 3.7 cm.  Mediastinum/Nodes: There is a subcentimeter nodular opacity in the  left lobe of the thyroid, stable. There are multiple subcentimeter  mediastinal lymph nodes again noted. There are prominent right  paratracheal lymph nodes with largest lymph node in this area  measuring 1.8 x 1.4 cm, marginally smaller  than on prior study.  Aortopulmonary window lymph nodes appear slightly smaller than on  previous study. No new lymph node prominence is evident on this  study. No esophageal lesions are evident.  Lungs/Pleura: There is atelectatic change in the lung base regions,  stable. There is no frank edema or consolidation. A mild degree of  centrilobular emphysematous change appears stable. No appreciable  pleural effusion.  Upper Abdomen: There is a cyst in the medial upper pole right kidney  measuring 1.5 x 1.5 cm. A cyst is noted in the lateral mid left  kidney measuring 1.5 x 1.3 cm. There is aortic atherosclerosis as  well as calcification in the proximal visualized great vessels.  Visualized upper abdominal structures otherwise appear unremarkable.  Musculoskeletal: There is stable anterior wedging of the L1  vertebral body. There is multilevel degenerative change in the  thoracic spine which appear stable. No blastic or lytic bone lesions  are evident. No evident chest wall lesions.     CT chest 04/2018 (cone): Cardiovascular: Mild cardiomegaly. Small pericardial  effusion/thickening. Left anterior descending and right coronary  atherosclerosis. Atherosclerotic nonaneurysmal thoracic aorta.  Dilated main pulmonary artery (3.6 cm diameter). No central  pulmonary emboli.  Mediastinum/Nodes: Subcentimeter hypodense posterior left thyroid  lobe nodule. Unremarkable esophagus. No axillary adenopathy.  Enlarged right paratracheal nodes up to 1.6 cm (series 2/image 54).  Enlarged 1.1 cm AP window node (series 2/image 51). No  pathologically enlarged hilar nodes.  Lungs/Pleura: No pneumothorax. Trace dependent left pleural  effusion. No right pleural effusion. No acute consolidative airspace  disease, lung masses or significant pulmonary nodules. Minimal  interlobular septal thickening throughout both lungs. Several mildly  thickened parenchymal bands in the dependent right middle lobe,  lingula and right lower lobe. No significant regions of  bronchiectasis. Mild centrilobular emphysema with mild diffuse  bronchial wall thickening.  Upper abdomen: Partially visualized simple 1.6 cm medial upper right  renal cyst. Mild scarring in the visualized upper right kidney.  Musculoskeletal: No aggressive appearing focal osseous lesions.  Stable mild chronic L1 vertebral compression fracture. Moderate  thoracic spondylosis.     V/Q scan : none    08/2019: 152 meters. HR 79->101, O2 sat 91->88% on 3LPM, needing 4LPM. Max Borg 2.    Pulmonary Function Testing:  Date FEV1 FVC TLC FRC RV DLCO    04/2021  1.12 (66%)  1.91 (86%)        08/2019  0.99 (56%)  1.83 (79%)     5.0 (26%)    04/2018  0.98 (59%)  1.88 (89%)  68%   63%   (29%)                Overnight oximetry 01/2021 (NIPPV/5L): 48 min <89%, avg 90%, nadir 81%, ODI 15  Overnight oximetry 08/2019: 20 min <89% (total time 6.5 hours), avg 90%, nadir 94%       Compliance data with very good usage  PSG : done in past but not available     Relevant Serologies:   Labs 08/2019: ANA pos 1:160 homogenous, ENA neg, RF/CCP neg  Labs 12/2018: ANCA neg, ACE 31    Routine Labs:        Date NTproBNP Creatinine  Potassium  Bicarbonate  Hemoglobin  abs eos    05/2024 10100 2.2 5.4 26      04/2024          03/2024 8183 2.6 4.6 26 11.9 100    08/2023 4K->26K  01/2023 BNP 840->601 3.2 4.6 24 13.3 200    07/2022 BNP 167 2.4 4.4  26 13.6     04/2022 BNP 200 2.6   31 11.7     07/2021 BNP 445 2.6   30  9.7  300    03/2021 BNP 567 1.5 4.1  31  8.2  200    12/2020 BNP 926->479 1.3 3.8  33  8.9     11/2020  3800   BNP 567              07/2020  BNP 603              05/2020  1970  1.3  4.3  32  11.2     10/2019  2220  1.1    34        08/2019  1410              07/2019  1270  0.9  4.1  29  10.0     06/2019  6600->13K             11/2018  BNP 253              08/2018  BNP 531

## 2024-06-08 ENCOUNTER — Ambulatory Visit: Admit: 2024-06-08 | Discharge: 2024-06-09 | Payer: Medicare (Managed Care)

## 2024-06-08 DIAGNOSIS — J449 Chronic obstructive pulmonary disease, unspecified: Principal | ICD-10-CM

## 2024-06-08 DIAGNOSIS — J9611 Chronic respiratory failure with hypoxia: Principal | ICD-10-CM

## 2024-06-08 DIAGNOSIS — R0600 Dyspnea, unspecified: Principal | ICD-10-CM

## 2024-06-08 DIAGNOSIS — I272 Pulmonary hypertension, unspecified: Principal | ICD-10-CM

## 2024-06-08 DIAGNOSIS — I5032 Chronic diastolic (congestive) heart failure: Principal | ICD-10-CM

## 2024-06-08 DIAGNOSIS — E0590 Thyrotoxicosis, unspecified without thyrotoxic crisis or storm: Principal | ICD-10-CM

## 2024-06-08 DIAGNOSIS — I50812 Chronic right heart failure: Principal | ICD-10-CM

## 2024-06-08 LAB — BASIC METABOLIC PANEL
ANION GAP: 11 mmol/L (ref 5–14)
BLOOD UREA NITROGEN: 68 mg/dL — ABNORMAL HIGH (ref 9–23)
BUN / CREAT RATIO: 30
CALCIUM: 9.5 mg/dL (ref 8.7–10.4)
CHLORIDE: 100 mmol/L (ref 98–107)
CO2: 25.9 mmol/L (ref 20.0–31.0)
CREATININE: 2.23 mg/dL — ABNORMAL HIGH (ref 0.55–1.02)
EGFR CKD-EPI (2021) FEMALE: 22 mL/min/1.73m2 — ABNORMAL LOW (ref >=60)
GLUCOSE RANDOM: 149 mg/dL (ref 70–179)
POTASSIUM: 5.4 mmol/L — ABNORMAL HIGH (ref 3.4–4.8)
SODIUM: 137 mmol/L (ref 135–145)

## 2024-06-08 LAB — TSH: THYROID STIMULATING HORMONE: 16.943 u[IU]/mL — ABNORMAL HIGH (ref 0.550–4.780)

## 2024-06-08 LAB — PRO-BNP: PRO-BNP: 10111 pg/mL — ABNORMAL HIGH (ref <=300.0)

## 2024-06-08 LAB — T4, FREE: FREE T4: 1.27 ng/dL (ref 0.89–1.76)

## 2024-06-08 MED ORDER — ALBUTEROL SULFATE HFA 90 MCG/ACTUATION AEROSOL INHALER
Freq: Four times a day (QID) | RESPIRATORY_TRACT | 3 refills | 0.00000 days | Status: CP | PRN
Start: 2024-06-08 — End: 2025-06-08

## 2024-06-08 NOTE — Unmapped (Signed)
 Thank you for visiting the [facility] Pulmonary Hypertension Clinic.    If labs were taken today, we will contact you if any results need to be addressed before next visit. Please call or Mychart for any specific questions about results.    If you have any questions or concerns, please call     Maryella Smothers, MD  Gerome Koyanagi, RN, Pulmonary Hypertension Nurse Coordinator  Currie Douse, RN, Pulmonary Hypertension Nurse Coordinator    [phone].         Lab work today.  Breathing test next visit in 3 months.  I will send an albuterol  inhaler to the pharmacy.

## 2024-06-11 ENCOUNTER — Emergency Department
Admit: 2024-06-11 | Discharge: 2024-06-12 | Disposition: A | Discharge status: Home / Self Care | Payer: Medicare (Managed Care) | Attending: Student in an Organized Health Care Education/Training Program

## 2024-06-11 DIAGNOSIS — R051 Acute cough: Principal | ICD-10-CM

## 2024-06-11 LAB — CBC W/ AUTO DIFF
BASOPHILS ABSOLUTE COUNT: 0.1 10*9/L (ref 0.0–0.1)
BASOPHILS RELATIVE PERCENT: 0.6 %
EOSINOPHILS ABSOLUTE COUNT: 0.2 10*9/L (ref 0.0–0.5)
EOSINOPHILS RELATIVE PERCENT: 1.5 %
HEMATOCRIT: 37.3 % (ref 34.0–44.0)
HEMOGLOBIN: 12.1 g/dL (ref 11.3–14.9)
LYMPHOCYTES ABSOLUTE COUNT: 0.9 10*9/L — ABNORMAL LOW (ref 1.1–3.6)
LYMPHOCYTES RELATIVE PERCENT: 7.4 %
MEAN CORPUSCULAR HEMOGLOBIN CONC: 32.4 g/dL (ref 32.0–36.0)
MEAN CORPUSCULAR HEMOGLOBIN: 30.2 pg (ref 25.9–32.4)
MEAN CORPUSCULAR VOLUME: 93.4 fL (ref 77.6–95.7)
MEAN PLATELET VOLUME: 8.7 fL (ref 6.8–10.7)
MONOCYTES ABSOLUTE COUNT: 1.2 10*9/L — ABNORMAL HIGH (ref 0.3–0.8)
MONOCYTES RELATIVE PERCENT: 9.4 %
NEUTROPHILS ABSOLUTE COUNT: 9.9 10*9/L — ABNORMAL HIGH (ref 1.8–7.8)
NEUTROPHILS RELATIVE PERCENT: 81.1 %
PLATELET COUNT: 242 10*9/L (ref 150–450)
RED BLOOD CELL COUNT: 4 10*12/L (ref 3.95–5.13)
RED CELL DISTRIBUTION WIDTH: 14.9 % (ref 12.2–15.2)
WBC ADJUSTED: 12.3 10*9/L — ABNORMAL HIGH (ref 3.6–11.2)

## 2024-06-11 LAB — COMPREHENSIVE METABOLIC PANEL
ALBUMIN: 3.3 g/dL — ABNORMAL LOW (ref 3.4–5.0)
ALKALINE PHOSPHATASE: 132 U/L — ABNORMAL HIGH (ref 46–116)
ALT (SGPT): 8 U/L — ABNORMAL LOW (ref 10–49)
ANION GAP: 12 mmol/L (ref 5–14)
AST (SGOT): 26 U/L (ref <=34)
BILIRUBIN TOTAL: 0.5 mg/dL (ref 0.3–1.2)
BLOOD UREA NITROGEN: 71 mg/dL — ABNORMAL HIGH (ref 9–23)
BUN / CREAT RATIO: 27
CALCIUM: 9.7 mg/dL (ref 8.7–10.4)
CHLORIDE: 97 mmol/L — ABNORMAL LOW (ref 98–107)
CO2: 25 mmol/L (ref 20.0–31.0)
CREATININE: 2.6 mg/dL — ABNORMAL HIGH (ref 0.55–1.02)
EGFR CKD-EPI (2021) FEMALE: 18 mL/min/1.73m2 — ABNORMAL LOW (ref >=60)
GLUCOSE RANDOM: 187 mg/dL — ABNORMAL HIGH (ref 70–179)
POTASSIUM: 4 mmol/L (ref 3.4–4.8)
PROTEIN TOTAL: 8 g/dL (ref 5.7–8.2)
SODIUM: 134 mmol/L — ABNORMAL LOW (ref 135–145)

## 2024-06-11 LAB — HIGH SENSITIVITY TROPONIN I - 2 HOUR SERIAL
HIGH SENSITIVITY TROPONIN - DELTA (0-2H): 7 ng/L (ref <=7)
HIGH-SENSITIVITY TROPONIN I - 2 HOUR: 126 ng/L (ref <=34)

## 2024-06-11 LAB — HIGH SENSITIVITY TROPONIN I - SERIAL: HIGH SENSITIVITY TROPONIN I: 133 ng/L (ref <=34)

## 2024-06-11 LAB — MAGNESIUM: MAGNESIUM: 2.4 mg/dL (ref 1.6–2.6)

## 2024-06-11 LAB — PRO-BNP: PRO-BNP: 7546 pg/mL — ABNORMAL HIGH (ref <=300.0)

## 2024-06-11 MED ORDER — AMOXICILLIN 500 MG-POTASSIUM CLAVULANATE 125 MG TABLET
ORAL_TABLET | Freq: Two times a day (BID) | ORAL | 0 refills | 4.00000 days | Status: CP
Start: 2024-06-11 — End: 2024-06-15

## 2024-06-11 MED ORDER — BENZONATATE 200 MG CAPSULE
ORAL_CAPSULE | Freq: Three times a day (TID) | ORAL | 1 refills | 10.00000 days | Status: CP | PRN
Start: 2024-06-11 — End: 2024-06-18

## 2024-06-11 MED ORDER — AZITHROMYCIN 250 MG TABLET
ORAL_TABLET | Freq: Every day | ORAL | 1 refills | 6.00000 days | Status: CP
Start: 2024-06-11 — End: ?

## 2024-06-11 NOTE — Unmapped (Signed)
 Pt arrives d/t cough since Friday. Pt has hx of COPD on 3 L [place] at baseline. Pt reports coughing up greenish brown sputum. Pt cough sounds wet. Pt able to answer questions appropriately.

## 2024-06-11 NOTE — Unmapped (Signed)
 Pt here with family c/o cough

## 2024-06-11 NOTE — Unmapped (Signed)
 [facility] Respiratory Encounter  This medical encounter was conducted virtually using [email or web address] protocols.    Patient ID: Barbara Huber is a 83 y.o. female who presents by video interaction for evaluation.    I have identified myself to the patient and conveyed my credentials to [employer]   Patient has signed informed consent on file in medical record.    Present on Video Call: Is there someone else in the room? Yes. What is your relationship? daughter. Do you want this person here for the visit? yes..    Assessment/Plan:      Barbara Huber was seen today for cough.    Diagnoses and all orders for this visit:    Acute cough  -     benzonatate (TESSALON) 200 MG capsule; Take 1 capsule (200 mg total) by mouth Three (3) times a day as needed for cough for up to 7 days.    - per patient and daughter, does not seem volume up today  - no wheezing noted by patient, cough only intermittently annoying, only productive at the end of coughing episode   - will hold on treating as COPD exacerbation for now  - can increase Guaifenesin, add Tessalon   - she has also messaged her pulmonologist, but it is Sunday  - will have her follow up if not improving in 48 hours, sooner if worsening  - ideally she could be evaluated in person if able    -- Discussed the new prescription noted above, including potential side effects, drug interactions, instructions for taking the medication, and the consequences of not taking it.  -- Patient verbalized an understanding of today's assessment and recommendations, as well as the purpose of ongoing medications.    Follow-up as Needed , Follow-up with PCP, and Follow-up with Specialist         Medication adherence and barriers to the treatment plan have been addressed. Opportunities to optimize healthy behaviors have been discussed. Patient / caregiver voiced understanding.          Subjective:     Cough  Pertinent negatives include no fever, shortness of breath or wheezing. Barbara Huber is 83 y.o. and presents today in the [facility] with respiratory symptoms.  The PCP for this patient is Barbara Overly, MD.  Has complicated medical history including COPD on 3L, chronic hypoxemic resp failure, [name], [name] IV, atrial fibrillation on anticoagulation and rate control.   Cough with some phlegm, started two-three days ago. No fevers or chills. No wheezing, respiratory status is at her baseline. O2 is doing ok, not needing more oxygen , not feeling sick. She is using Mucinex 600 mg BID, maybe helping.   She did increase her Metolazone  after her visit with her pulmonologist on 6/12 and is down 4 lbs, does not feel like she is volume up at this point. Also of note, she had slight cough at that visit and her lungs sounded clear per patient and daughter.   Cough is more annoyingly intermittent rather than persistent.   Wondering if she needs an antibiotic.   She is currently on Spiriva  and Advair  for her COPD. New Albuterol  inhaler prescribed on 6/12, but has not used as instructed to use only if feeling pretty SOB.           ROS  Review of Systems   Constitutional:  Negative for fever.   Respiratory:  Positive for cough. Negative for shortness of breath and wheezing.  All other ROS per HPI.    I have reviewed the problem list, past medical history, past family history, medications, and allergies and have updated/reconciled them if needed.          Objective:   Physical Exam    As part of this Video Visit, no in-person exam was conducted.  Video interaction permitted the following observations.    Vitals obtained from patient equipment:   General: No acute distress.   HEENT: Oropharynx clear. EOMI.  RESP: Relaxed respiratory effort. No conversational dyspnea. Nasal cannula in place.  SKIN: No rashes noted.  NEURO:  Alert and oriented.  Speech fluent and sensible.  Calm affect.           The patient reports they are physically located in [place]  and is currently: at home. I conducted a audio/video visit. I spent  2m 32s on the video call with the patient. I spent an additional 8 minutes on pre- and post-visit activities on the date of service .

## 2024-06-11 NOTE — Unmapped (Signed)
 Emergency Department Provider Note      Assessment and Medical Decision Making      83 y.o. 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    Vitals on presentation are notable for 100% on 3l [place] but otherwise within normal limits.  Physical exam notable for coarse breath sounds bilaterally with mild expiratory wheezing and prolonged expiratory phase, faint heart sounds, nontender abdomen, no peripheral edema, and no calf tenderness.    Differential includes COPD exacerbation, pneumonia, heart failure exacerbation, less likely PE given lack of chest pain or tachycardia, anemia, electrolyte derangements, pneumothorax.  Chest x-ray with pulmonary edema, troponin and BNP near baseline, CBC with mild leukocytosis of 12.3 which is new, CMP with baseline [name] but otherwise grossly normal.  Will trend troponin.      See ED course for continuation of care.    ED Course as of 06/11/24 2354   Sun Jun 11, 2024   2032 Creatinine(!): 2.60  Near priors.   2032 hsTroponin I(!!): 133  Down from baseline of 200s   2032 PRO-BNP(!): 7,546.0  Down from 10000 ten days ago   2045 XR Chest 2 views  On my independent interpretation: 2 view upright and lateral, no Etrenol free air, enlarged cardiac silhouette, mediastinal silhouette is within normal limits, no acute fracture, left pleural effusion, bilateral cephalization with interstitial edema and fluid in the right fissure.   2210 XR Chest 2 views  Radiologist IMPRESSION:     Pulmonary vascular congestion and chronic interstitial prominence. Mild interstitial edema not excluded.     2211 ECG 12 Lead  On my independent interpretation: There appears to be A-V dissociation with looking at the rhythm strip obtained 1 with P waves that do not correspond to QRS intervals with varying PR length.  QRS morphology is maintained. Suspect fast escape rhythm possibly accelerated junctional rhythm.  There are no ischemic changes such as ST elevation or depression, pathologic T wave inversions, or new pathologic Q waves.  There are Q waves in the inferior leads which are seen on prior.  Will obtain rhythm strip to better evaluate underlying rhythm.   2331 hsTroponin I(!!): 126  Downtrending.   2338 Second troponin is downtrending.  Patient's workup has been reassuring.  Suspect patient is having COPD exacerbation versus community-acquired pneumonia.  Will treat with Augmentin and azithromycin .  Given patient is on their home O2 requirement without increased work of breathing or tachypnea there is no indication for hospitalization at this time.  Will discharge.   2352 Workup discussed with the family.  They will monitor the patient at home.  They felt comfortable taking her home.  I discussed return precautions with the patient and her daughter which include worsening shortness of breath, increased respiratory rate, increased oxygen  requirements, fever, or any other concerning symptoms.   11:36 PM  PROGRESS NOTE: Rhythm strip obtained and is notable for normal sinus rhythm with occasional PAC. No evidence of AV dissociation.        Independent Interpretation of Studies: EKG and rhythm strip: See above; POCUS: See same-day report  External Records Reviewed: Pulmonology clinic note from 06/08/2024  Escalation of Care, Consideration of Admission/Observation/Transfer: Escalation of Care, Consideration of Admission/Observation/Transfer: However, patient was determined to be appropriate for outpatient management.    ____________________________________________    The case was discussed with the attending  physician, who is in agreement with the above assessment and plan.       History     Chief Complaint   Patient presents with    Cough       HPI: Barbara Huber is a pleasant 83 y.o. 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  Patient states that on Friday (2 days ago) she developed a worsening cough.  She denies any chest pain.  She does endorse mild increased dyspnea with exertion.  She denies calf tenderness or swelling.  Her PCP had recommended she take metolazone  due to her elevated proBNP on outpatient labs.  She took a single dose of metolazone  on Friday.  She is lost 4 pounds since then.  She denies fever or chills.  She endorses change in sputum color from brown to green.  She is on her home 3 L nasal cannula.  She has recently discontinued methimazole  as her PCP was concerned about overtreatment.    PMH/PSH: As noted in HPI.    Allergies: Allergies[1]    Social: Short Social History[2]    Physical Exam   BP 155/81  - Pulse 87  - Temp 37.3 ??C (99.1 ??F) (Oral)  - Resp 18  - LMP  (LMP Unknown)  - SpO2 100%      Physical Exam  Vitals and nursing note reviewed.   Constitutional:       Appearance: Normal appearance.   HENT:      Head: Normocephalic and atraumatic.     Eyes:      Conjunctiva/sclera: Conjunctivae normal.       Cardiovascular:      Rate and Rhythm: Normal rate and regular rhythm.      Heart sounds: Normal heart sounds.   Pulmonary:      Effort: Pulmonary effort is normal.      Breath sounds: Wheezing and rhonchi present.   Abdominal:      General: There is no distension.      Palpations: Abdomen is soft.      Tenderness: There is no abdominal tenderness.     Musculoskeletal:         General: No tenderness. Normal range of motion.      Right lower leg: No edema.      Left lower leg: No edema.     Skin:     General: Skin is warm.      Coloration: Skin is not jaundiced or pale.     Neurological:      General: No focal deficit present.      Mental Status: She is alert.     Psychiatric:         Mood and Affect: Mood normal.           Radiology     XR Chest 2 views   Final Result Pulmonary vascular congestion and chronic interstitial prominence. Mild interstitial edema not excluded.               ED POCUS    (Results Pending)       Labs     Labs Reviewed   COMPREHENSIVE METABOLIC PANEL - Abnormal; Notable for the following components:       Result Value    Sodium 134 (*)     Chloride 97 (*)     BUN 71 (*)  Creatinine 2.60 (*)     eGFR [name]-EPI (2021) Female 18 (*)     Glucose 187 (*)     Albumin 3.3 (*)     ALT 8 (*)     Alkaline Phosphatase 132 (*)     All other components within normal limits   PRO-BNP - Abnormal; Notable for the following components:    PRO-BNP 7,546.0 (*)     All other components within normal limits    Narrative:     Effective 09/06/23, NT-proBNP replaces BNP, results are not interchangeable. Values less than 300 pg/mL strongly rule out the presence of acute heart failure.   HIGH SENSITIVITY TROPONIN I - SERIAL - Abnormal; Notable for the following components:    hsTroponin I 133 (*)     All other components within normal limits   HIGH SENSITIVITY TROPONIN I - 2 HOUR SERIAL - Abnormal; Notable for the following components:    hsTroponin I 126 (*)     All other components within normal limits   CBC W/ AUTO DIFF - Abnormal; Notable for the following components:    WBC 12.3 (*)     Absolute Neutrophils 9.9 (*)     Absolute Lymphocytes 0.9 (*)     Absolute Monocytes 1.2 (*)     All other components within normal limits   MAGNESIUM  - Normal   CBC W/ DIFFERENTIAL    Narrative:     The following orders were created for panel order CBC w/ Differential.  Procedure                               Abnormality         Status                     ---------                               -----------         ------                     CBC w/ Differential[[id number]]         Abnormal            Final result                 Please view results for these tests on the individual orders.        Shareen Daub, PGY-3  [facility] Emergency Medicine  Pager: [phone]         To patients reading this note: Please be advised the primary purpose of this note is to keep track of your care and communicate with other members of your medical team. Standard sentence structure is not always used. Medical terminology and medical abbreviations may be used. Parts of this note may have been generated using voice dictation software. There may be wrong-word or sound-alike substitution errors and typographical errors missed in proofreading.                [1]   Allergies  Allergen Reactions    Nitrofurantoin Anaphylaxis and Other (See Comments)    Lipitor  [Atorvastatin ] Muscle Pain     SOB, Headache, fatigue, sick on my stomach   [2]   Social History  Tobacco Use    Smoking status: Former     Current packs/day: 0.00  Types: Cigarettes     Quit date: 2019     Years since quitting: 6.4    Smokeless tobacco: Never    Tobacco comments:     Quit a few years ago   Vaping Use    Vaping status: Never Used   Substance Use Topics    Alcohol use: No    Drug use: No        Tor Freed, MD  Resident  06/11/24 [phone]

## 2024-06-12 ENCOUNTER — Encounter
Admit: 2024-06-12 | Discharge: 2024-06-16 | Disposition: A | Discharge status: Home Health | Payer: Medicare (Managed Care) | Admitting: Student in an Organized Health Care Education/Training Program

## 2024-06-12 ENCOUNTER — Ambulatory Visit: Admit: 2024-06-12 | Discharge: 2024-06-16 | Payer: Medicare (Managed Care)

## 2024-06-12 ENCOUNTER — Encounter
Admit: 2024-06-12 | Discharge: 2024-06-16 | Disposition: A | Discharge status: Home Health | Payer: Medicare (Managed Care) | Attending: Student in an Organized Health Care Education/Training Program | Admitting: Student in an Organized Health Care Education/Training Program

## 2024-06-12 ENCOUNTER — Ambulatory Visit
Admit: 2024-06-12 | Discharge: 2024-06-16 | Disposition: A | Discharge status: Home Health | Payer: Medicare (Managed Care) | Admitting: Student in an Organized Health Care Education/Training Program

## 2024-06-12 ENCOUNTER — Inpatient Hospital Stay
Admit: 2024-06-12 | Discharge: 2024-06-16 | Disposition: A | Discharge status: Home Health | Payer: Medicare (Managed Care) | Admitting: Student in an Organized Health Care Education/Training Program

## 2024-06-12 DIAGNOSIS — J441 Chronic obstructive pulmonary disease with (acute) exacerbation: Principal | ICD-10-CM

## 2024-06-12 LAB — COMPREHENSIVE METABOLIC PANEL
ALBUMIN: 3.2 g/dL — ABNORMAL LOW (ref 3.4–5.0)
ALKALINE PHOSPHATASE: 127 U/L — ABNORMAL HIGH (ref 46–116)
ALT (SGPT): <7 U/L — ABNORMAL LOW (ref 10–49)
ANION GAP: 16 mmol/L — ABNORMAL HIGH (ref 5–14)
AST (SGOT): 27 U/L (ref <=34)
BILIRUBIN TOTAL: 1 mg/dL (ref 0.3–1.2)
BLOOD UREA NITROGEN: 74 mg/dL — ABNORMAL HIGH (ref 9–23)
BUN / CREAT RATIO: 27
CALCIUM: 9.6 mg/dL (ref 8.7–10.4)
CHLORIDE: 96 mmol/L — ABNORMAL LOW (ref 98–107)
CO2: 24 mmol/L (ref 20.0–31.0)
CREATININE: 2.72 mg/dL — ABNORMAL HIGH (ref 0.55–1.02)
EGFR CKD-EPI (2021) FEMALE: 17 mL/min/1.73m2 — ABNORMAL LOW (ref >=60)
GLUCOSE RANDOM: 114 mg/dL (ref 70–179)
POTASSIUM: 4 mmol/L (ref 3.4–4.8)
PROTEIN TOTAL: 7.8 g/dL (ref 5.7–8.2)
SODIUM: 136 mmol/L (ref 135–145)

## 2024-06-12 LAB — CBC W/ AUTO DIFF
BASOPHILS ABSOLUTE COUNT: 0.1 10*9/L (ref 0.0–0.1)
BASOPHILS RELATIVE PERCENT: 0.3 %
EOSINOPHILS ABSOLUTE COUNT: 0 10*9/L (ref 0.0–0.5)
EOSINOPHILS RELATIVE PERCENT: 0 %
HEMATOCRIT: 35.8 % (ref 34.0–44.0)
HEMOGLOBIN: 11.6 g/dL (ref 11.3–14.9)
LYMPHOCYTES ABSOLUTE COUNT: 0.8 10*9/L — ABNORMAL LOW (ref 1.1–3.6)
LYMPHOCYTES RELATIVE PERCENT: 4.1 %
MEAN CORPUSCULAR HEMOGLOBIN CONC: 32.3 g/dL (ref 32.0–36.0)
MEAN CORPUSCULAR HEMOGLOBIN: 29.8 pg (ref 25.9–32.4)
MEAN CORPUSCULAR VOLUME: 92.2 fL (ref 77.6–95.7)
MEAN PLATELET VOLUME: 8.8 fL (ref 6.8–10.7)
MONOCYTES ABSOLUTE COUNT: 1.9 10*9/L — ABNORMAL HIGH (ref 0.3–0.8)
MONOCYTES RELATIVE PERCENT: 9.9 %
NEUTROPHILS ABSOLUTE COUNT: 16.2 10*9/L — ABNORMAL HIGH (ref 1.8–7.8)
NEUTROPHILS RELATIVE PERCENT: 85.7 %
PLATELET COUNT: 229 10*9/L (ref 150–450)
RED BLOOD CELL COUNT: 3.88 10*12/L — ABNORMAL LOW (ref 3.95–5.13)
RED CELL DISTRIBUTION WIDTH: 14.7 % (ref 12.2–15.2)
WBC ADJUSTED: 18.9 10*9/L — ABNORMAL HIGH (ref 3.6–11.2)

## 2024-06-12 LAB — BLOOD GAS, VENOUS
BASE EXCESS VENOUS: -0.4 (ref -2.0–2.0)
HCO3 VENOUS: 24 mmol/L (ref 22–27)
O2 SATURATION VENOUS: 41.3 % (ref 40.0–85.0)
PCO2 VENOUS: 44 mmHg (ref 40–60)
PH VENOUS: 7.36 (ref 7.32–7.43)
PO2 VENOUS: 26 mmHg — ABNORMAL LOW (ref 30–55)

## 2024-06-12 LAB — LACTATE SEPSIS, VENOUS 2: LACTATE BLOOD VENOUS: 2.7 mmol/L (ref 0.5–1.8)

## 2024-06-12 LAB — LACTATE SEPSIS, VENOUS 3: LACTATE BLOOD VENOUS: 1.4 mmol/L (ref 0.5–1.8)

## 2024-06-12 LAB — LACTATE SEPSIS, VENOUS: LACTATE BLOOD VENOUS: 4.7 mmol/L (ref 0.5–1.8)

## 2024-06-12 MED ADMIN — azithromycin (ZITHROMAX) tablet 500 mg: 500 mg | ORAL | @ 02:00:00 | Stop: 2024-06-11

## 2024-06-12 MED ADMIN — midodrine (PROAMATINE) tablet 5 mg: 5 mg | ORAL | @ 21:00:00

## 2024-06-12 MED ADMIN — vancomycin (VANCOCIN) 1500 mg in dextrose 5 % 300 mL IVPB (premix): 1500 mg | INTRAVENOUS | @ 16:00:00 | Stop: 2024-06-12

## 2024-06-12 MED ADMIN — piperacillin-tazobactam (ZOSYN) IVPB (premix) 2.25 g: 2.25 g | INTRAVENOUS | @ 18:00:00 | Stop: 2024-06-12

## 2024-06-12 MED ADMIN — sodium chloride 0.9% (NS) bolus 500 mL: 500 mL | INTRAVENOUS | @ 16:00:00 | Stop: 2024-06-12

## 2024-06-12 MED ADMIN — cefTRIAXone (ROCEPHIN) 1 g in sodium chloride 0.9 % (NS) 100 mL IVPB-MBP: 1 g | INTRAVENOUS | @ 22:00:00 | Stop: 2024-06-17

## 2024-06-12 MED ADMIN — amoxicillin-clavulanate (AUGMENTIN) 875-125 mg per tablet 1 tablet: 1 | ORAL | @ 02:00:00 | Stop: 2024-06-11

## 2024-06-12 NOTE — Unmapped (Addendum)
 [facility]  Emergency Department Provider Note      ED Clinical Impression       Diagnosis ICD-10-CM Associated Orders   1. AKI (acute kidney injury)  N17.9       2. Pneumonia due to infectious organism, unspecified laterality, unspecified part of lung  J18.9                Impression, Medical Decision Making, Progress Notes and Critical Care      Barbara Huber is a 83 y.o. female     Assessment & Plan  Worsening respiratory failure  She presents with worsening dyspnea and cough accompanied by fever. Differential diagnosis includes bacterial pneumonia versus viral infection versus heart failure exacerbation. Also considering PE though less likely given no clinical symptoms of DVT. Plan for CXR, EKG, labs, broad spectrum antibiotics, admission.        ED Course as of 06/12/24 1425   Mon Jun 12, 2024   1147 I independently reviewed the EKG and my interpretation is: Rhythm unclear, it may be sinus rhythm versus A-fib with regular rate, left axis deviation, nonspecific intraventricular conduction delay, nonspecific ST changes.   1149 Comparison with prior shows EKG overall unchanged.   1216 WBC(!): 18.9   1216 Worsening leukocytosis   1216 Lactate, Venous(!!): 4.7   1216 Is concerning for severe sepsis.  Cultures drawn and broad-spectrum antibiotics have been ordered.  Will give 500 mL IV fluid, holding off on full sepsis dose fluid resuscitation due to history of heart failure   1327 Creatinine(!): 2.72   1328 Bun(!): 74  Worsening creatinine.   1328 pH, Venous: 7.36   1328 pCO2, Ven: 44  RPP negative.   1328 Chest x-ray shows borderline enlarged cardiac silhouette, question of mild pulmonary edema, no focal infiltrate.   1329 Patient accepted for admission.   1425 Repeat lactate improved   1425 Lactate, Venous(!!): 2.7         Additional MDM Elements                          Portions of this record have been created using Dragon dictation software. Dictation errors have been sought, but may not have been identified and corrected.    See chart and nursing documentation for additional ED course details.           History        Reason for Visit  Shortness of Breath      HPI     History of Present Illness  Barbara Huber is an 83 year old female with chronic hypoxemic respiratory failure, CHF, amyloidosis and pulmonary hypertension who presents with worsening cough and shortness of breath.    She returns to the emergency department for the second consecutive day due to worsening cough and shortness of breath, which began approximately four days ago. Her dyspnea has increased today, with an oxygen  saturation of 76% measured at home.    She was seen in the emergency department yesterday and was prescribed Augmentin and amoxicillin , which she has not yet started. Her cardiologist advised against azithromycin  due to potential interactions with amiodarone .    She has a mild cough that has been worsening over the past few days. She is febrile to 100.2??F in the emergency department. No chest pain, leg swelling, or fevers at home. She has a history of a bad reaction to steroids, though specifics are unclear.    Outside Historian(s): Daughter  at bedside        Past Medical History[1]    Past Surgical History[2]      Current Facility-Administered Medications:     piperacillin -tazobactam (ZOSYN ) IVPB (premix) 2.25 g, 2.25 g, Intravenous, Once, Nicoletta Barrier, Fredricka Jenny, MD, Last Rate: 100 mL/hr at 06/12/24 1402, 2.25 g at 06/12/24 1402    Current Outpatient Medications:     acetaminophen  (TYLENOL ) 500 MG tablet, Take 2 tablets (1,000 mg total) by mouth Three (3) times a day as needed for pain., Disp: , Rfl:     albuterol  HFA 90 mcg/actuation inhaler, Inhale 2 puffs every eight (8) hours as needed for wheezing., Disp: , Rfl:     albuterol  HFA 90 mcg/actuation inhaler, Inhale 1 puff every six (6) hours as needed for wheezing., Disp: 8 g, Rfl: 3    amiodarone  (PACERONE ) 200 MG tablet, Take 1 tablet (200 mg total) by mouth daily., Disp: 30 tablet, Rfl: 11    amoxicillin -clavulanate (AUGMENTIN) 500-125 mg per tablet, Take 1 tablet by mouth two (2) times a day for 4 days., Disp: 8 tablet, Rfl: 0    apixaban  (ELIQUIS ) 2.5 mg Tab, Take 1 tablet (2.5 mg total) by mouth two (2) times a day., Disp: 180 tablet, Rfl: 3    azithromycin  (ZITHROMAX  Z-PAK) 250 MG tablet, Take 1 tablet (250 mg total) by mouth daily. 2 by mouth today the 1 by mouth daily for 4 days, Disp: 6 tablet, Rfl: 1    benzonatate (TESSALON) 200 MG capsule, Take 1 capsule (200 mg total) by mouth Three (3) times a day as needed for cough for up to 7 days., Disp: 30 capsule, Rfl: 1    blood sugar diagnostic (ACCU-CHEK GUIDE TEST STRIPS) Strp, Use to check blood sugar daily, Disp: 50 strip, Rfl: 3    cranberry 500 mg cap, Take 500 mg by mouth daily with evening meal., Disp: , Rfl:     cyclobenzaprine  (FLEXERIL ) 10 MG tablet, Take 1 tablet (10 mg total) by mouth two (2) times a day as needed for muscle spasms., Disp: 60 tablet, Rfl: 11    cyclobenzaprine  (FLEXERIL ) 5 MG tablet, Take 1.5 tablets (7.5 mg total) by mouth two (2) times a day as needed for muscle spasms. (Patient not taking: Reported on 06/11/2024), Disp: 90 tablet, Rfl: 11    ergocalciferol -1,250 mcg, 50,000 unit, (DRISDOL ) 1,250 mcg (50,000 unit) capsule, TAKE 1 CAPSULE ONCE A WEEK, Disp: 12 capsule, Rfl: 0    estradioL  (ESTRACE ) 0.01 % (0.1 mg/gram) vaginal cream, Insert 2 g into the vagina Two (2) times a week. (Patient not taking: Reported on 06/11/2024), Disp: 42.5 g, Rfl: 3    fluticasone  propion-salmeterol (ADVAIR  HFA) 115-21 mcg/actuation inhaler, Inhale 2 puffs two (2) times a day., Disp: 12 g, Rfl: 11    fosfomycin (MONUROL ) 3 gram Pack, Take 3 g by mouth once a week., Disp: 4 packet, Rfl: 6    gabapentin  (NEURONTIN ) 100 MG capsule, Take 1 capsule (100 mg total) by mouth two (2) times a day., Disp: 60 capsule, Rfl: 11    lancets (ACCU-CHEK SOFTCLIX LANCETS) Misc, CHECK BLOOD SUGAR DAILY, Disp: 200 each, Rfl: 11    lidocaine  (ASPERCREME) 4 % patch, Place 1 patch on the skin daily. Remove after 12 hours., Disp: 30 patch, Rfl: 2    metOLazone  (ZAROXOLYN ) 5 MG tablet, Take 1 tablet (5 mg total) by mouth daily as needed (up to 3x weekly when instructed by cardiology clinic)., Disp: , Rfl:     midodrine  (PROAMATINE ) 5  MG tablet, Take 1 tablet (5 mg total) by mouth three (3) times a day., Disp: 90 tablet, Rfl: 11    montelukast  (SINGULAIR ) 10 mg tablet, Take 1 tablet (10 mg total) by mouth every morning., Disp: 90 tablet, Rfl: 2    NARCAN 4 mg/actuation nasal spray, 1 spray into alternating nostrils once as needed (opioid overdose). PRN - Emergency use., Disp: , Rfl:     nystatin  (MYCOSTATIN ) 100,000 unit/gram powder, Apply to affected area 3 times daily, Disp: 30 g, Rfl: 1    oxyCODONE  (ROXICODONE ) 5 MG immediate release tablet, Take 1 tablet (5 mg total) by mouth every eight (8) hours as needed for pain., Disp: , Rfl:     OXYGEN -AIR DELIVERY SYSTEMS MISC, 5 L by Miscellaneous route. Currently using 3  L/min via [place], Disp: , Rfl:     pantoprazole  (PROTONIX ) 20 MG tablet, TAKE 1 TABLET(20 MG) BY MOUTH DAILY, Disp: 90 tablet, Rfl: 3    potassium chloride  20 MEQ ER tablet, Take 1 tablet (20 mEq total) by mouth daily. (Patient not taking: Reported on 06/11/2024), Disp: 90 tablet, Rfl: 3    rosuvastatin  (CRESTOR ) 5 MG tablet, TAKE 1 TABLET EVERY OTHER DAY, Disp: 45 tablet, Rfl: 3    spironolactone  (ALDACTONE ) 25 MG tablet, Take 0.5 tablets (12.5 mg total) by mouth daily., Disp: 30 tablet, Rfl: 2    tafamidis  (VYNDAMAX ) 61 mg cap, Take 1 capsule (61 mg) by mouth daily., Disp: 30 capsule, Rfl: 11    tiotropium bromide  (SPIRIVA  RESPIMAT) 2.5 mcg/actuation inhalation mist, Inhale 2 puffs daily., Disp: 4 g, Rfl: 11    torsemide  (DEMADEX ) 20 MG tablet, Take 4 tablets (80 mg total) by mouth daily., Disp: 360 tablet, Rfl: 3    traZODone  (DESYREL ) 50 MG tablet, Take 1 tablet (50 mg total) by mouth nightly as needed for sleep. (Patient not taking: Reported on 06/11/2024), Disp: 30 tablet, Rfl: 1    vutrisiran  (AMVUTTRA ) 25 mg/0.5 mL injection, Inject 0.5 mL (25 mg total) under the skin once. Every 12 Weeks, Disp: , Rfl:     Allergies  Nitrofurantoin and Lipitor  [atorvastatin ]    Family History[3]    Short Social History[4]       Physical Exam     ED Triage Vitals   Enc Vitals Group      BP 06/12/24 1121 113/66      Pulse 06/12/24 1121 91      SpO2 Pulse 06/12/24 1121 92      Resp 06/12/24 1121 25      Temp 06/12/24 1120 36.6 ??C (97.9 ??F)      Temp Source 06/12/24 1120 Oral      SpO2 06/12/24 1121 93 %      Weight 06/12/24 1125 77.1 kg (170 lb)      Height --       Head Circumference --       Peak Flow --       Pain Score --       Pain Loc --       Pain Education --       Exclude from Growth Chart --        Constitutional: Alert and oriented.  Appears chronically ill-appearing and fatigued but no acute distress.  Eyes: Conjunctivae are normal.  ENT       Head: Normocephalic and atraumatic.       Nose: No congestion.       Mouth/Throat: Mucous membranes are moist.  Neck: No stridor.  Cardiovascular: Normal rate, regular rhythm.   Respiratory: Normal respiratory effort.  Diminished breath sounds at bases with scattered wheezes.  Gastrointestinal: Abdomen nondistended.  Musculoskeletal: Normal range of motion in all extremities.       Right lower leg: No tenderness or edema.       Left lower leg: No tenderness or edema.  Neurologic: Normal speech and language. No gross focal neurologic deficits are appreciated.  Skin: Skin is warm, dry and intact. No rash noted.  Psychiatric: Mood and affect are normal. Speech and behavior are normal.       Radiology     XR Chest 2 views   Final Result         Similar radiographic imaging of the chest relative to the comparison exam from 1 day prior. Notably:   1.  Diffuse interstitial prominence appears to predominantly reflect bronchial thickening, which is likely associated with infectious/inflammatory airway disease. However, some element of edema is not excluded.   2.  The lateral chest radiograph is limited. However, there is minimal, if any, pleural fluid.                             Napolean Backbone, MD  06/12/24 1421         [1]   Past Medical History:  Diagnosis Date    Acute exacerbation of CHF (congestive heart failure)    08/19/2023    Acute kidney injury superimposed on chronic kidney disease 10/11/2020    Acute on chronic diastolic (congestive) heart failure    08/23/2020    Acute on chronic diastolic congestive heart failure    08/23/2020    AKI (acute kidney injury) 04/14/2015    Lab Results  Component  Value  Date     CREATININE  1.90 (H)  06/12/2021     Had a bump in her creatinine when she was taking her diuretics every day.  She is currently taking 40 mg daily of torsemide  and 50 mg of spironolactone .  Her volume status is fragile.  Previously when she stopped her diuretic she becomes short of breath.  Plan: We will check her BMP today.  We will likely have to go to 40    Anemia 08/22/2019    Iron  deficiency anemia          Lab Results      Component    Value    Date           WBC    9.2    03/21/2021           RBC    3.67 (L)    03/21/2021           HGB    9.9 (L)    03/21/2021           HCT    30.7 (L)    03/21/2021           MCV    83.7    03/21/2021           MCH    26.9    03/21/2021           MCHC    32.1    03/21/2021           RDW    21.9 (H)    03/21/2021           PLT    318  Arthritis     Calculus of kidney     Calculus of ureter     CHF (congestive heart failure)        Chronic atrial fibrillation    07/20/2019    COPD (chronic obstructive pulmonary disease)        Diabetes        Gangrenous cholecystitis 10/11/2020    Generalized edema  06/17/2021    GERD (gastroesophageal reflux disease)     Hydronephrosis     Hypertension     Hyponatremia 10/11/2020    Intermediate coronary syndrome    03/13/2014    Lower extremity edema 09/28/2020    Lumbar stenosis     Microscopic hematuria     Nausea alone Nephrolithiasis 04/17/2016    Neuropathy     Nocturia     Nocturia 07/01/2017    Other chronic cystitis     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  See plans for anemia, AKI  We are also tapering her gabapentin .  Currently on 300 mg 3 times daily.  We will decrease it to twice daily, and then nightly.  She states she is not having any recurrence of her pain.    Pulmonary hypertension        Renal colic     Shortness of breath 04/28/2020    Sleep apnea     Unstable angina pectoris    03/13/2014   [2]   Past Surgical History:  Procedure Laterality Date    BACK SURGERY  1995    CARPAL TUNNEL RELEASE Left 2014    HYSTERECTOMY  1971    IR INSERT CHOLECYSTOSTOMY TUBE PERCUTANEOUS  10/02/2020    IR INSERT CHOLECYSTOSMY TUBE PERCUTANEOUS 10/02/2020 Judeth Notch, MD IMG [facility]IR [facility]    LUMBAR DISC SURGERY      PR REMOVAL GALLBLADDER N/A 10/06/2020    Procedure: CHOLECYSTECTOMY;  Surgeon: True Fuss, MD;  Location: MAIN [facility];  Service: Trauma    PR RIGHT HEART CATH O2 SATURATION & CARDIAC OUTPUT N/A 09/30/2020    Procedure: Right Heart Catheterization;  Surgeon: Penne Bowl, MD;  Location: [facility] CATH;  Service: Cardiology    PR RIGHT HEART CATH O2 SATURATION & CARDIAC OUTPUT N/A 01/09/2021    Procedure: Right Heart Catheterization;  Surgeon: Coralie Derrick, MD;  Location: [facility] CATH;  Service: Cardiology   [3]   Family History  Problem Relation Age of Onset    Hypertension Mother     Cancer Father         COLON CANCER    No Known Problems Sister     No Known Problems Sister     No Known Problems Brother     No Known Problems Brother     No Known Problems Maternal Aunt     No Known Problems Maternal Uncle     No Known Problems Paternal Aunt     No Known Problems Paternal Uncle     No Known Problems Maternal Grandmother     No Known Problems Maternal Grandfather     No Known Problems Paternal Grandmother     No Known Problems Paternal Grandfather     No Known Problems Other     Anesthesia problems Neg Hx     Broken bones Neg Hx     Clotting disorder Neg Hx     Collagen disease Neg Hx     Diabetes Neg Hx     Dislocations  Neg Hx     Fibromyalgia Neg Hx     Gout Neg Hx     Hemophilia Neg Hx     Osteoporosis Neg Hx     Rheumatologic disease Neg Hx     Scoliosis Neg Hx     Severe sprains Neg Hx     Sickle cell anemia Neg Hx     Spinal Compression Fracture Neg Hx     GU problems Neg Hx     Kidney cancer Neg Hx     Prostate cancer Neg Hx    [4]   Social History  Tobacco Use    Smoking status: Former     Current packs/day: 0.00     Types: Cigarettes     Quit date: 2019     Years since quitting: 6.4    Smokeless tobacco: Never    Tobacco comments:     Quit a few years ago   Vaping Use    Vaping status: Never Used   Substance Use Topics    Alcohol use: No    Drug use: No        Napolean Backbone, MD  06/12/24 1425

## 2024-06-12 NOTE — Unmapped (Addendum)
 [facility] Medicine   History and Physical       Assessment and Plan     Barbara Huber is a 83 y.o. female who is presenting to [facility] with Dyspnea on exertion, in the setting of the following pertinent/contributing co-morbidities: Pulmonary hypertension, COPD, chronic hypoxic respiratory failure, heart failure with preserved ejection fraction, persistent atrial fibrillation on amiodarone .      Dyspnea on exertion --likely multifactorial with COPD exacerbation/bronchial pneumonia in the setting of pulmonary hypertension with a very narrow therapeutic window for diuresis - chronic hypoxic respiratory failure with 3 L oxygen  requirement - pulmonary hypertension - obstructive sleep apnea  Illness that poses a threat to life or bodily function without appropriate treatment  Thick green sputum, elevated white count, and fever argue for infectious etiology.  On exam, more crackles left greater than the right with only faint wheezes.  Chest x-ray with both some degree of atelectasis and bronchial thickening but without a discrete consolidation.  Discussed possibility of steroids for possible COPD exacerbation and steroids have resulted in worsened volume status and overall functional status in the past and patient and daughter would like to avoid steroids if at all possible.  Patient also with amiodarone  making both azithromycin  and fluoroquinolones less of a attractive option.  -Holding on steroids for now given poor tolerance in the past  - Scheduled albuterol   - Doxycycline  and ceftriaxone   - Favoring infectious etiology over volume overload present and prioritizing slightly worsened renal function over overly aggressive diuresis as per below, but low threshold to restart home Lasix  and spironolactone  as per below  - Continuing home AVAPS for obstructive sleep apnea and underlying COPD with heart failure  - Using formulary equivalent Breo Ellipta  in place of Advair   - Formulary equivalent of Incruse Ellipta  in place of Spiriva   - Follow-up sputum culture     Incomplete bladder emptying  Takes fosfomycin once a week on Fridays.  Considering UTI especially with fevers and chills  - Bladder scanning and will In-N-Out catheter if needed  - Following up on UA and culture      Chronic heart failure with preserved ejection fraction     Secondary to TTR amyloid. Followed by Dr Derrek Flicker, Diuresis can be complicated by worsening of her [name].  Currently, treating this admission as infection as above without concomitant heart failure exacerbation and given rising creatinine, holding diuresis.  With his narrow therapeutic window she has, she could very easily develop heart failure exacerbation with the holding of her home diuretics so we will need to monitor very carefully.  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  Holding as above      Type 2 diabetes mellitus with stage 4 chronic kidney disease, with long-term current use of insulin      Currently not on medication  -Will check a.m. sugars       Peripheral neuropathy and chronic pain  - Takes Flexeril  at home and will continue but keep on delirium precautions with low threshold to stop  - Continue gabapentin   - Continue home oxycodone   Persistent atrial fibrillation     -Continue amiodarone       - Continue apixaban       Chronic kidney disease ([name]), stage IV (severe)     Baseline creatinine in the low twos.  Now high twos  - Strict I's and O's  - Bladder scan  - Avoid nephrotoxic agents    Prophylaxis  - Continue home apixaban     Diet  -Nutrition Therapy Regular/House    Code Status / HCDM   -Full Code, Discussed with patient at the time of admission   Discussed likely poor outcomes of patient never required mechanical ventilation.  Patient and daughter are clear goals of care currently full code  -  HCDM (With Legal Document To Support): [name],[name] - Daughter - [phone]    Anticipated Medically Ready for Discharge: Anticipated in 2-4 Days    Significant Comorbid Conditions:  -Age related debility POA requiring additional resources: DME, PT, or OT    Issues Impacting Complexity of Management:  -The patient is at high risk from Hospital immobility in an elderly patient given baseline poor functional status with a high risk of causing delirium and further decline in function  -Need for the following intensive monitoring parameter(s) due to high risk of clinical decline: continuous oxygen  monitoring and frequent monitoring of urine output to assess for the efficacy of diuretic regimen    Medical Decision Making: Reviewed records from the following unique sources EMR. Independently interpreted chest x-ray notable for atelectasis and bronchial thickening. Discussed the patient's management and/or test interpretation with ED Provider as summarized within this note    I personally spent greater than 75 minutes face-to-face and non-face-to-face in the care of this patient, which includes all pre, intra, and post visit time on the date of service.  All documented time was specific to the E/M visit and does not include any procedures that may have been performed.    HPI      Barbara Huber is a 83 y.o. female who is presenting to [facility] with Dyspnea on exertion.    Patient reports some increased dyspnea for the last week but with increased cough and significant increase in dyspnea since Friday.  Cough has been mixture of greenish to brown sputum that is thick.  Normal sputum production is occasionally brown but usually white or frothy.  She has been having more fatigability and overall more tired and somewhat less expressive since Friday.  Patient also with associated fevers and chills.  No dysuria but does have a history of urinary retention.    Patient presented to the emergency department on the 15th and was discharged home but returned on the 16th with higher oxygen  requirement at the time of presentation to the emergency department.  In talking with the daughter, she has been fairly stable on 3 L at home but has less ability to ambulate before desatting.  Patient has never had much lower extremity edema and none seen during this period.  Patient admits to baseline orthopnea and while she is not requiring more elevation, patient reports worsening shortness of breath when she does lie flat.    She was recently seen in clinic and directed to take a dose of metolazone  which she takes as needed at the direction of her provider.  Symptoms have continued to worsen    ER course was notable for presentation requiring 8 L nasal cannula   Venous lactate of 4.7  White count of 18.9    Patient has responded favorably to  500 of IV fluids and since settling into bed, is now back on her 3 L of nasal cannula.  Venous lactate is also improved with IV fluids.    PMHx:  COPD with chronic hypoxic respiratory failure and pulmonary hypertension felt to be dominant left heart/group 2 pathology (small precapillary component, either secondary to heart failure with preserved ejection fraction versus COPD )seen by [facility] pulmonary --was last seen on the 12th  - Has not tolerated LABA/LAMA with Stiolto, so back on Spiriva /Advair   - Recently started metolazone  dosing.  - 3 L continuous nasal cannula at night with Trilogy --benefiting from AVAPS settings  Hyperthyroidism on methimazole   Pulmonary hypertension  Heart failure with preserved ejection fraction    Social history:  Patient lives with her daughter and her daughters husband.  She is normally fairly limited in her activity due to her lung disease but at baseline, is bright and conversant.  She enjoys watching TV and playing on an iPad  No alcohol no smoking      Med Rec Confidence   I reviewed the Medication List. The current list is Accurate    Physical Exam   Temp:  [36.6 ??C (97.9 ??F)-37.9 ??C (100.3 ??F)] 36.8 ??C (98.3 ??F)  Pulse:  [77-93] 82  SpO2 Pulse:  [74-93] 88  Resp:  [16-25] 23  BP: (102-155)/(54-111) 117/90  SpO2:  [91 %-100 %] 95 %  Body mass index is 26.62 kg/m??.  GEN: NAD, sitting up in bed  EYES: EOMI  ENT: MMM  CV: Irregular, warm hands, positive JVD to level of the jaw at 40 degrees at elevation  PULM: Coarse breath sounds and without wheezes.  Deep crackles bilaterally left greater than the right  ABD: soft, NT/ND, +BS  EXT: No lower extremity edema  NEURO: No focal deficits  PSYCH: Alert and appropriate.  Daughter at bedside        ___________________________________________________________________    Medications     Prior to Admission medications   Medication Dose, Route, Frequency   acetaminophen  (TYLENOL ) 500 MG tablet 1,000 mg, Oral, 3 times a day PRN   albuterol  HFA 90 mcg/actuation inhaler 2 puffs, Every 8 hours PRN   albuterol  HFA 90 mcg/actuation inhaler 1 puff, Inhalation, Every 6 hours PRN   amiodarone  (PACERONE ) 200 MG tablet 200 mg, Oral, Daily (standard)   amoxicillin -clavulanate (AUGMENTIN) 500-125 mg per tablet 1 tablet, Oral, 2 times a day (standard)   apixaban  (ELIQUIS ) 2.5 mg Tab 2.5 mg, Oral, 2 times a day (standard)   azithromycin  (ZITHROMAX  Z-PAK) 250 MG tablet 250 mg, Oral, Daily (standard), 2 by mouth today the 1 by mouth daily for 4 days   benzonatate (TESSALON) 200 MG capsule 200 mg, Oral, 3 times a day PRN   blood sugar diagnostic (ACCU-CHEK GUIDE TEST STRIPS) Strp Use to check blood sugar daily   cranberry 500 mg cap 500 mg, Daily   cyclobenzaprine  (FLEXERIL ) 10 MG tablet 10 mg, Oral, 2 times a day PRN   cyclobenzaprine  (FLEXERIL ) 5 MG tablet 7.5 mg, Oral, 2 times a day PRN  Patient not taking: Reported on 06/11/2024   ergocalciferol -1,250 mcg, 50,000 unit, (DRISDOL ) 1,250 mcg (50,000 unit) capsule TAKE 1 CAPSULE ONCE A WEEK   estradioL  (ESTRACE ) 0.01 % (0.1 mg/gram) vaginal cream 2 g, Vaginal, 2 times a week  Patient not taking: Reported on 06/11/2024   fluticasone  propion-salmeterol (ADVAIR  HFA) 115-21 mcg/actuation inhaler 2 puffs, Inhalation, 2 times a day (standard)   fosfomycin (MONUROL ) 3 gram Pack 3 g,  Oral, Weekly   gabapentin  (NEURONTIN ) 100 MG capsule 100 mg, Oral, 2 times a day (standard)   lancets (ACCU-CHEK SOFTCLIX LANCETS) Misc CHECK BLOOD SUGAR DAILY   lidocaine  (ASPERCREME) 4 % patch 1 patch, Transdermal, Daily (standard), Remove after 12 hours.   metOLazone  (ZAROXOLYN ) 5 MG tablet 5 mg, Oral, Daily PRN   midodrine  (PROAMATINE ) 5 MG tablet 5 mg, Oral, 3 times a day   montelukast  (SINGULAIR ) 10 mg tablet 10 mg, Oral, Every morning   NARCAN 4 mg/actuation nasal spray 1 spray, Once as needed   nystatin  (MYCOSTATIN ) 100,000 unit/gram powder Apply to affected area 3 times daily   oxyCODONE  (ROXICODONE ) 5 MG immediate release tablet 5 mg, Every 8 hours PRN   OXYGEN -AIR DELIVERY SYSTEMS MISC 5 L   pantoprazole  (PROTONIX ) 20 MG tablet 20 mg, Oral, Daily (standard), TAKE 1 TABLET(20 MG) BY MOUTH DAILY   potassium chloride  20 MEQ ER tablet 20 mEq, Oral, Daily (standard)  Patient not taking: Reported on 06/11/2024   rosuvastatin  (CRESTOR ) 5 MG tablet TAKE 1 TABLET EVERY OTHER DAY   spironolactone  (ALDACTONE ) 25 MG tablet 12.5 mg, Oral, Daily (standard)   tafamidis  (VYNDAMAX ) 61 mg cap Take 1 capsule (61 mg) by mouth daily.   tiotropium bromide  (SPIRIVA  RESPIMAT) 2.5 mcg/actuation inhalation mist 2 puffs, Inhalation, Daily (standard)   torsemide  (DEMADEX ) 20 MG tablet 80 mg, Oral, Daily (standard)   traZODone  (DESYREL ) 50 MG tablet 50 mg, Oral, Nightly PRN  Patient not taking: Reported on 06/11/2024   vutrisiran  (AMVUTTRA ) 25 mg/0.5 mL injection 25 mg, Once       Allergies   Nitrofurantoin and Lipitor  [atorvastatin ] Medical History   Past Medical History[1]    Social History   Tobacco use:   reports that she quit smoking about 6 years ago. Her smoking use included cigarettes. She has never used smokeless tobacco.  Alcohol use:   reports no history of alcohol use.  Drug use:  reports no history of drug use.    Family History   Family History[2]    Surgical History   Past Surgical History[3]          [1]   Past Medical History:  Diagnosis Date    Acute exacerbation of CHF (congestive heart failure)    08/19/2023    Acute kidney injury superimposed on chronic kidney disease 10/11/2020    Acute on chronic diastolic (congestive) heart failure    08/23/2020    Acute on chronic diastolic congestive heart failure    08/23/2020    AKI (acute kidney injury) 04/14/2015    Lab Results  Component  Value  Date     CREATININE  1.90 (H)  06/12/2021     Had a bump in her creatinine when she was taking her diuretics every day.  She is currently taking 40 mg daily of torsemide  and 50 mg of spironolactone .  Her volume status is fragile.  Previously when she stopped her diuretic she becomes short of breath.  Plan: We will check her BMP today.  We will likely have to go to 40    Anemia 08/22/2019    Iron  deficiency anemia          Lab Results      Component    Value    Date           WBC    9.2    03/21/2021           RBC    3.67 (L)  03/21/2021           HGB    9.9 (L)    03/21/2021           HCT    30.7 (L)    03/21/2021           MCV    83.7    03/21/2021           MCH    26.9    03/21/2021           MCHC    32.1    03/21/2021           RDW    21.9 (H)    03/21/2021           PLT    318     Arthritis     Calculus of kidney     Calculus of ureter     CHF (congestive heart failure)        Chronic atrial fibrillation    07/20/2019    COPD (chronic obstructive pulmonary disease)        Diabetes        Gangrenous cholecystitis 10/11/2020    Generalized edema  06/17/2021    GERD (gastroesophageal reflux disease)     Hydronephrosis Hypertension     Hyponatremia 10/11/2020    Intermediate coronary syndrome    03/13/2014    Lower extremity edema 09/28/2020    Lumbar stenosis     Microscopic hematuria     Nausea alone     Nephrolithiasis 04/17/2016    Neuropathy     Nocturia     Nocturia 07/01/2017    Other chronic cystitis     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  See plans for anemia, AKI  We are also tapering her gabapentin .  Currently on 300 mg 3 times daily.  We will decrease it to twice daily, and then nightly.  She states she is not having any recurrence of her pain.    Pulmonary hypertension        Renal colic     Shortness of breath 04/28/2020    Sleep apnea     Unstable angina pectoris    03/13/2014   [2]   Family History  Problem Relation Age of Onset    Hypertension Mother     Cancer Father         COLON CANCER    No Known Problems Sister     No Known Problems Sister     No Known Problems Brother     No Known Problems Brother     No Known Problems Maternal Aunt     No Known Problems Maternal Uncle     No Known Problems Paternal Aunt     No Known Problems Paternal Uncle     No Known Problems Maternal Grandmother     No Known Problems Maternal Grandfather     No Known Problems Paternal Grandmother     No Known Problems Paternal Grandfather     No Known Problems Other     Anesthesia problems Neg Hx     Broken bones Neg Hx     Clotting disorder Neg Hx     Collagen disease Neg Hx     Diabetes Neg Hx     Dislocations Neg Hx     Fibromyalgia Neg Hx     Gout Neg Hx     Hemophilia Neg Hx  Osteoporosis Neg Hx     Rheumatologic disease Neg Hx     Scoliosis Neg Hx     Severe sprains Neg Hx     Sickle cell anemia Neg Hx     Spinal Compression Fracture Neg Hx     GU problems Neg Hx     Kidney cancer Neg Hx     Prostate cancer Neg Hx    [3]   Past Surgical History:  Procedure Laterality Date    BACK SURGERY  1995    CARPAL TUNNEL RELEASE Left 2014    HYSTERECTOMY  1971    IR INSERT CHOLECYSTOSTOMY TUBE PERCUTANEOUS  10/02/2020    IR INSERT CHOLECYSTOSMY TUBE PERCUTANEOUS 10/02/2020 Judeth Notch, MD IMG VIR [facility]    LUMBAR DISC SURGERY      PR REMOVAL GALLBLADDER N/A 10/06/2020    Procedure: CHOLECYSTECTOMY;  Surgeon: True Fuss, MD;  Location: MAIN OR [facility];  Service: Trauma    PR RIGHT HEART CATH O2 SATURATION & CARDIAC OUTPUT N/A 09/30/2020    Procedure: Right Heart Catheterization;  Surgeon: Penne Bowl, MD;  Location: [facility] CATH;  Service: Cardiology    PR RIGHT HEART CATH O2 SATURATION & CARDIAC OUTPUT N/A 01/09/2021    Procedure: Right Heart Catheterization;  Surgeon: Coralie Derrick, MD;  Location: [facility] CATH;  Service: Cardiology

## 2024-06-12 NOTE — Unmapped (Signed)
 Bed: 63-D  Expected date: 06/12/24  Expected time:   Means of arrival:   Comments:  RM 1

## 2024-06-12 NOTE — Unmapped (Signed)
 Pt BIB daughter from home for c/o COPD exacerbation. Reports hypoxia, low SPO2 (76% at home)  and increased WOB. Normally wears 3L N/C - now on 8L N/C. Seen yesterday for same.

## 2024-06-12 NOTE — Unmapped (Signed)
.  Patient from triage escorted to room 1. Pt paced on cardiac monitor, continuous pulse oximeter and continuous blood pressure monitoring.    Pt is SOB O2 sat 83% . Family turned up pt O2 sat for 8L/min.     Assistance pt to the room 1 notified to Nurse Rue Cota . Pt at this time O2 sat on 3 L/ min was 93 %.

## 2024-06-13 LAB — COMPREHENSIVE METABOLIC PANEL
ALBUMIN: 2.5 g/dL — ABNORMAL LOW (ref 3.4–5.0)
ALKALINE PHOSPHATASE: 100 U/L (ref 46–116)
ALT (SGPT): <7 U/L — ABNORMAL LOW (ref 10–49)
ANION GAP: 16 mmol/L — ABNORMAL HIGH (ref 5–14)
AST (SGOT): 22 U/L (ref <=34)
BILIRUBIN TOTAL: 0.7 mg/dL (ref 0.3–1.2)
BLOOD UREA NITROGEN: 75 mg/dL — ABNORMAL HIGH (ref 9–23)
BUN / CREAT RATIO: 29
CALCIUM: 8.9 mg/dL (ref 8.7–10.4)
CHLORIDE: 96 mmol/L — ABNORMAL LOW (ref 98–107)
CO2: 27.3 mmol/L (ref 20.0–31.0)
CREATININE: 2.63 mg/dL — ABNORMAL HIGH (ref 0.55–1.02)
EGFR CKD-EPI (2021) FEMALE: 18 mL/min/1.73m2 — ABNORMAL LOW (ref >=60)
GLUCOSE RANDOM: 78 mg/dL (ref 70–179)
POTASSIUM: 3.4 mmol/L (ref 3.4–4.8)
PROTEIN TOTAL: 6.3 g/dL (ref 5.7–8.2)
SODIUM: 139 mmol/L (ref 135–145)

## 2024-06-13 LAB — CBC
HEMATOCRIT: 31.3 % — ABNORMAL LOW (ref 34.0–44.0)
HEMOGLOBIN: 10.3 g/dL — ABNORMAL LOW (ref 11.3–14.9)
MEAN CORPUSCULAR HEMOGLOBIN CONC: 33 g/dL (ref 32.0–36.0)
MEAN CORPUSCULAR HEMOGLOBIN: 30.2 pg (ref 25.9–32.4)
MEAN CORPUSCULAR VOLUME: 91.3 fL (ref 77.6–95.7)
MEAN PLATELET VOLUME: 9 fL (ref 6.8–10.7)
PLATELET COUNT: 149 10*9/L — ABNORMAL LOW (ref 150–450)
RED BLOOD CELL COUNT: 3.42 10*12/L — ABNORMAL LOW (ref 3.95–5.13)
RED CELL DISTRIBUTION WIDTH: 14.8 % (ref 12.2–15.2)
WBC ADJUSTED: 23.1 10*9/L — ABNORMAL HIGH (ref 3.6–11.2)

## 2024-06-13 LAB — URINALYSIS WITH MICROSCOPY WITH CULTURE REFLEX PERFORMABLE
BACTERIA: NONE SEEN /HPF
BILIRUBIN UA: NEGATIVE
GLUCOSE UA: NEGATIVE
HYALINE CASTS: 4 /LPF — ABNORMAL HIGH (ref 0–1)
KETONES UA: NEGATIVE
LEUKOCYTE ESTERASE UA: NEGATIVE
NITRITE UA: NEGATIVE
PH UA: 6 (ref 5.0–9.0)
PROTEIN UA: NEGATIVE
RBC UA: 24 /HPF — ABNORMAL HIGH (ref <=4)
SPECIFIC GRAVITY UA: 1.012 (ref 1.003–1.030)
SQUAMOUS EPITHELIAL: <1 /HPF (ref 0–5)
UROBILINOGEN UA: <2
WBC UA: 3 /HPF (ref 0–5)

## 2024-06-13 LAB — MAGNESIUM: MAGNESIUM: 2.3 mg/dL (ref 1.6–2.6)

## 2024-06-13 LAB — PRO-BNP: PRO-BNP: 15236 pg/mL — ABNORMAL HIGH (ref <=300.0)

## 2024-06-13 LAB — PHOSPHORUS: PHOSPHORUS: 4.8 mg/dL (ref 2.4–5.1)

## 2024-06-13 MED ADMIN — albuterol 2.5 mg /3 mL (0.083 %) nebulizer solution 2.5 mg: 2.5 mg | RESPIRATORY_TRACT | @ 16:00:00

## 2024-06-13 MED ADMIN — albuterol 2.5 mg /3 mL (0.083 %) nebulizer solution 2.5 mg: 2.5 mg | RESPIRATORY_TRACT | @ 02:00:00

## 2024-06-13 MED ADMIN — amiodarone (PACERONE) tablet 200 mg: 200 mg | ORAL | @ 12:00:00

## 2024-06-13 MED ADMIN — pantoprazole (Protonix) EC tablet 20 mg: 20 mg | ORAL | @ 14:00:00

## 2024-06-13 MED ADMIN — midodrine (PROAMATINE) tablet 5 mg: 5 mg | ORAL | @ 22:00:00

## 2024-06-13 MED ADMIN — acetaminophen (TYLENOL) tablet 650 mg: 650 mg | ORAL | @ 22:00:00

## 2024-06-13 MED ADMIN — apixaban (ELIQUIS) tablet 2.5 mg: 2.5 mg | ORAL | @ 02:00:00

## 2024-06-13 MED ADMIN — amiodarone (PACERONE) tablet 200 mg: 200 mg | ORAL | @ 02:00:00

## 2024-06-13 MED ADMIN — gabapentin (NEURONTIN) capsule 100 mg: 100 mg | ORAL | @ 12:00:00

## 2024-06-13 MED ADMIN — polyethylene glycol (MIRALAX) packet 17 g: 17 g | ORAL | @ 14:00:00

## 2024-06-13 MED ADMIN — tafamidis cap 61 mg capsule **Patient Supplied**: 61 mg | ORAL | @ 17:00:00

## 2024-06-13 MED ADMIN — albuterol 2.5 mg /3 mL (0.083 %) nebulizer solution 2.5 mg: 2.5 mg | RESPIRATORY_TRACT | @ 19:00:00

## 2024-06-13 MED ADMIN — apixaban (ELIQUIS) tablet 2.5 mg: 2.5 mg | ORAL | @ 12:00:00

## 2024-06-13 MED ADMIN — gabapentin (NEURONTIN) capsule 100 mg: 100 mg | ORAL | @ 02:00:00

## 2024-06-13 MED ADMIN — umeclidinium (INCRUSE ELLIPTA) 62.5 mcg/actuation inhaler 1 puff: 1 | RESPIRATORY_TRACT | @ 13:00:00

## 2024-06-13 MED ADMIN — doxycycline (VIBRAMYCIN) 100 mg in sodium chloride 0.9 % (NS) 100 mL IVPB-MBP: 100 mg | INTRAVENOUS | @ 09:00:00 | Stop: 2024-06-17

## 2024-06-13 MED ADMIN — cefTRIAXone (ROCEPHIN) 1 g in sodium chloride 0.9 % (NS) 100 mL IVPB-MBP: 1 g | INTRAVENOUS | @ 22:00:00 | Stop: 2024-06-17

## 2024-06-13 MED ADMIN — fluticasone furoate-vilanterol (BREO ELLIPTA) 100-25 mcg/dose inhaler 1 puff: 1 | RESPIRATORY_TRACT | @ 13:00:00

## 2024-06-13 MED ADMIN — midodrine (PROAMATINE) tablet 5 mg: 5 mg | ORAL | @ 12:00:00

## 2024-06-13 MED ADMIN — acetaminophen (TYLENOL) tablet 650 mg: 650 mg | ORAL | @ 09:00:00

## 2024-06-13 MED ADMIN — albuterol 2.5 mg /3 mL (0.083 %) nebulizer solution 2.5 mg: 2.5 mg | RESPIRATORY_TRACT | @ 12:00:00

## 2024-06-13 MED ADMIN — midodrine (PROAMATINE) tablet 5 mg: 5 mg | ORAL | @ 17:00:00

## 2024-06-13 MED ADMIN — doxycycline (VIBRAMYCIN) 100 mg in sodium chloride 0.9 % (NS) 100 mL IVPB-MBP: 100 mg | INTRAVENOUS | @ 02:00:00 | Stop: 2024-06-17

## 2024-06-13 MED ADMIN — oxyCODONE (ROXICODONE) immediate release tablet 5 mg: 5 mg | ORAL | @ 14:00:00 | Stop: 2024-06-27

## 2024-06-13 NOTE — Unmapped (Signed)
 Clinical Pharmacy Non-Formulary Home Medication Evaluation Note     Team is requesting continuation of the patient's non-formulary home medication tafamidis  Per PolicyStat ID: [id number] (Medication Management: Patient-Supplied Medications,) the continued use of these medications during hospital admission was evaluated by pharmacy.      Recommendation: Continuation of non-formulary home medication tafamidis  is clinically necessary and will be identified by pharmacy for continuation. Per policy, patient-supplied medications must be supplied by the patient for the duration of the encounter.      Rationale: This medication was approved for inpatient use by Higinio Love (clinical pharmacist). On each encounter, clinical appropriateness will be evaluated.    This information has been communicated to the ordering clinician. Pharmacy will continue to follow patient with team. Please page the service pharmacist pager found in the [facility] Directory if further immediate assistance is required.     Thank you,     Higinio Love, PharmD

## 2024-06-13 NOTE — Unmapped (Addendum)
 Outpatient follow-up items:  [ ]  Ongoing attention to diuretic titration  [ ]  Discuss rate control options with cardiology team -- BB or CCB would require particular care in context of cardiac amyloid  [ ]  Repeat TSH, fT4 in about 4 weeks (~07/16/2024)    Hospital course:  Barbara Huber is a 83 y.o. 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 Aside from desaturation after activity in the ED requiring up to 8L [place] to recover, supplemental oxygen  needs were not more than baseline. Admitted for acute exacerbation of chronic bronchitis and improved with antibiotics, airway clearance and supportive care. Now ready for discharge back home with daughter. Details below.    # COPD with acute exacerbation (bronchitis)  # Chronic hypoxemic and hypercarbic respiratory failure  Presenting symptoms as above. Initially was tachypneic (RR 25) and hypoxemic to mid-80%s requiring 8L [place] to recover but weaned to baseline 3L [place] while in the ED. VBG reassuring (7.36 / 44). Labs notable for leukocytosis (WBC up to 23.1, neutrophil-predominant), lactate 4.7, proBNP 15k (increased from 10k one week prior). CXR without focal consolidation, but bronchial wall thickening compatible with acute bronchitis. Respiratory pathogen PCR panel negative including for COVID. Little wheezing and no lower extremity edema on exam. Symptoms were favored to represent infection rather than primarily bronchospasm or volume overload. Treated empirically with piperacillin -tazobactam in the ED, switched to ceftriaxone  and doxycycline  once admitted. Continued ICS / LAMA / LABA plus scheduled albuterol  and Aerobika for airway clearance. Deferred steroids in absence of wheezing, and per her and daughter's strong preference. Work of breathing and constitutional symptoms improved over several days. Switched to cefdinir  and doxycycline  to complete 5 days total treatment, which was finished by time of discharge. Breathing comfortably on 3L [place] at rest at discharge.    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 Had tried extra doses of metolazone  prior to admission, based on dyspnea and rise in proBNP, without improvement and appeared dry on admission. Received just 500 mL IV fluids in the ED but paused diuretics for several days until oral intake improved. Limited echocardiogram obtained to exclude new RV failure and/or new valvular disease as cause for worsening dyspnea; this showed moderate pulmonary hypertension, no new valvular disease and normal RV size and function. Resumed torsemide  and spironolactone  but recommended to withhold metolazone  unless gaining weight or instructed to resume by cardiologist. Weight 80.8 kg (178 lb 1.6 oz) at discharge; recent dry weight thought to be 175-180 lb. Continued tafamidis . Communicated plans with cardiology APP by secure chat prior to discharge.    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 Note that GFR estimated by cystatin-C is < 20 so sCr may overestimate her true renal function.    # Persistent atrial fibrillation  Was in Afib on presentation but did have periods of sinus rhythm during hospital stay. Ventricular rates < 100. Continued amiodarone  and apixaban  (dose-reduced for age and renal function). Given failure of multiple attempts at durable rhythm control, discussed with outpatient cardiology APP the possibility of switching to rate-control strategy  with beta-blocker given recent thyroid abnormalities. We note that in the past (see notes from ~2022) she had difficulty tolerating sufficient dose of metoprolol  to control Afib but not precipitate drop in cardiac output. Opted to just continue amiodarone  for now.    # Thyroid dysfunction  Followed by PCP for this issue. Recently stopped methimazole . Did NOT check thyroid studies during acute illness but do not suspect thyroid abnormalities were related to her presenting concern.    # Obstructive sleep apnea  Used home AVAPS device at night without issue.    # Incomplete bladder emptying  Required straight catheterization in the ED. Monitored for retention with bladder scans while inpatient but did not require catheterization again. Resumed prophylactic fosfomycin at discharge.    # Peripheral neuropathy, chronic pain  Continued gabapentin  and oxycodone  without change in doses. PDMP reviewed; appears to be taking these appropriately.

## 2024-06-13 NOTE — Unmapped (Signed)
 [facility]ist Daily Progress Note     LOS: 1 day     Assessment/Plan:  Principal Problem:    Dyspnea on exertion  Active Problems:    COPD (chronic obstructive pulmonary disease)       Sleep apnea in adult    Essential hypertension    Type 2 diabetes mellitus with stage 4 chronic kidney disease, with long-term current use of insulin        Incomplete bladder emptying    Peripheral neuropathy    Chronic respiratory failure with hypoxia       Persistent atrial fibrillation       Chronic kidney disease ([name]), stage IV (severe)       Pulmonary hypertension       Chronic heart failure with preserved ejection fraction       Cardiac amyloidosis            Barbara Huber is a 83 y.o. female who is presenting to [facility] with Dyspnea on exertion, in the setting of the following pertinent/contributing co-morbidities: Pulmonary hypertension, COPD, chronic hypoxic respiratory failure, heart failure with preserved ejection fraction, persistent atrial fibrillation on amiodarone .        Dyspnea on exertion   Chronic hypoxic respiratory failure with 3 L oxygen  requirement   Pulmonary hypertension   Obstructive sleep apnea  Her DOE is presumed to be multifactorial in nature, with COPD exacerbation/bronchial pneumonia in the setting of pulmonary hypertension likely all contributing.  She appears to have a very narrow therapeutic window for diuresis   Illness that poses a threat to life or bodily function without appropriate treatment. She endorses having thick green sputum, this coupled with her increased WBC, and fever to 37.9 would suggest an acute infection.  Admission exam notable for more crackles left greater than the right with only faint wheezes. Exam this morning notable for bibasilar rhonchi . CXR w some degree of atelectasis and bronchial thickening but without a discrete consolidation.  Pro BNP notably elevated to 15,236 this morning.   - Defer steroids for now given poor tolerance in the past (see H&P for details).   - Scheduled albuterol   - Doxycycline  and ceftriaxone   - Favoring infectious etiology over volume overload present and prioritizing slightly worsened renal function over overly aggressive diuresis as per below, but low threshold to restart home Lasix  and spironolactone  as per below  - Continuing home AVAPS for obstructive sleep apnea and underlying COPD with heart failure  - Using formulary equivalent Breo Ellipta  in place of Advair   - Formulary equivalent of Incruse Ellipta  in place of Spiriva   - Follow-up sputum culture      Incomplete bladder emptying  Takes fosfomycin once a week on Fridays.  UA was not suggestive of UTI.   - Bladder scanning can I / O cath if needed  - FU UC     Chronic heart failure with preserved ejection fraction     Secondary to TTR amyloid. Followed by Dr Derrek Flicker, Diuresis can be complicated by worsening of her [name].  Currently, treating this admission as infection as above without concomitant heart failure exacerbation and given rising creatinine, holding diuresis.  With her narrow therapeutic window she has, she could very easily develop heart failure exacerbation with the holding of her home diuretics so we will need to monitor very carefully. Given her overall poor po intake right now, will hold diuretics today.   -Holding metolazone  (patient has been taking 5 mg as instructed by her providers as often as  3 times a week)  -Holding Lasix  and spironolactone   -Cont tafamidis   -Cont vutrisran     Secondary/Additional Active Problems:     Chronic myocardial injury:  Patient with stable troponin elevation in the setting of permanent atrial fibrillation and heart failure preserved ejection fraction secondary to amyloidosis     Essential hypertension  Managed with current diuretic meds.  Holding as noted above     Type 2 diabetes mellitus with stage 4 chronic kidney disease, with long-term current use of insulin      Currently not on medication. No hyperglycemia this morning.   - Will check a.m. sugars w BMP       Peripheral neuropathy and chronic pain  - Takes Flexeril  at home and will continue but keep on delirium precautions with low threshold to stop  - Continue gabapentin  100 BID   - Continue home oxycodone      Persistent atrial fibrillation     HR acceptable since admission.   - Continue amiodarone       - Continue apixaban      Chronic kidney disease ([name]), stage IV (severe)     Baseline creatinine in the low twos.  Now high twos . No significant change in Creatinine this morning.   - Strict I's and O's  - Bladder scan  - Avoid nephrotoxic agents    DVT prophylaxis. Eliquis       Disposition: Transfer to floor.      Please page the [facility]) pager at [phone] with questions.      Pending labs:   Pending Labs       Order Current Status    Blood Culture In process    Blood Culture In process            Subjective:   Still not eating very well. Maybe feeling a little better. No new complaints this morning.      Objective:   Physical Exam:    GEN: NAD, supine in bed.    RESPIRATORY: Breathing pattern was normal and the chest moved symmetrically.  Lung sounds were notable for scattered bi basilar rhonchi.    CARDIOVASCULAR: Heart rate and rhythm were normal.  S1 and S2 were normal and there were no extra sounds or murmurs.   ABDOMEN: The abdomen was normal in contour.  Bowel sounds were present.  Palpation detected no tenderness.  GENITOURINARY - foley absent.    NEUROLOGIC: Mental status was normal.   The patient was normally coordinated in the arms.   PSYCHIATRIC:  Speech was normal. Mood and affect were normal     Vital signs in last 24 hours:  Temp:  [36.6 ??C (97.9 ??F)-37.9 ??C (100.3 ??F)] 37.4 ??C (99.4 ??F)  Pulse:  [69-93] 70  SpO2 Pulse:  [68-93] 70  Resp:  [14-25] 16  BP: (102-143)/(38-111) 116/38  MAP (mmHg):  [64-121] 64  SpO2:  [91 %-99 %] 97 %    Intake/Output last 24 hours:    Intake/Output Summary (Last 24 hours) at 06/13/2024 0718  Last data filed at 06/13/2024 0530  Gross per 24 hour   Intake 246.67 ml   Output 700 ml   Net -453.33 ml       Medications:   Scheduled Meds:Scheduled Medications[1]  Continuous Infusions:Infusions Meds[2]    D Donis Furnish, MD MD  [facility].            [1]    albuterol   2.5 mg Nebulization 4x Daily (RT)    amiodarone   200 mg Oral Daily    apixaban   2.5 mg Oral BID    cefTRIAXone   1 g Intravenous Q24H    doxycycline   100 mg Intravenous Q12H    gabapentin   100 mg Oral BID    midodrine   5 mg Oral TID   [2]

## 2024-06-14 LAB — BASIC METABOLIC PANEL
ANION GAP: 17 mmol/L — ABNORMAL HIGH (ref 5–14)
BLOOD UREA NITROGEN: 68 mg/dL — ABNORMAL HIGH (ref 9–23)
BUN / CREAT RATIO: 27
CALCIUM: 9.3 mg/dL (ref 8.7–10.4)
CHLORIDE: 95 mmol/L — ABNORMAL LOW (ref 98–107)
CO2: 23.6 mmol/L (ref 20.0–31.0)
CREATININE: 2.5 mg/dL — ABNORMAL HIGH (ref 0.55–1.02)
EGFR CKD-EPI (2021) FEMALE: 19 mL/min/1.73m2 — ABNORMAL LOW (ref >=60)
GLUCOSE RANDOM: 104 mg/dL (ref 70–179)
POTASSIUM: 3.4 mmol/L (ref 3.4–4.8)
SODIUM: 136 mmol/L (ref 135–145)

## 2024-06-14 LAB — MAGNESIUM: MAGNESIUM: 2.3 mg/dL (ref 1.6–2.6)

## 2024-06-14 LAB — C-REACTIVE PROTEIN: C-REACTIVE PROTEIN: 152.1 mg/L — ABNORMAL HIGH (ref <=10.0)

## 2024-06-14 LAB — CYSTATIN C
CYSTATIN C: 3.6 mg/L — ABNORMAL HIGH (ref 0.64–1.23)
EGFR CKD-EPI (2012) CYSTATIN C FEMALE: 12 mL/min/{1.73_m2} — ABNORMAL LOW (ref >=60)

## 2024-06-14 MED ADMIN — midodrine (PROAMATINE) tablet 5 mg: 5 mg | ORAL | @ 13:00:00

## 2024-06-14 MED ADMIN — albuterol 2.5 mg /3 mL (0.083 %) nebulizer solution 2.5 mg: 2.5 mg | RESPIRATORY_TRACT | @ 12:00:00

## 2024-06-14 MED ADMIN — umeclidinium (INCRUSE ELLIPTA) 62.5 mcg/actuation inhaler 1 puff: 1 | RESPIRATORY_TRACT | @ 12:00:00

## 2024-06-14 MED ADMIN — midodrine (PROAMATINE) tablet 5 mg: 5 mg | ORAL | @ 21:00:00

## 2024-06-14 MED ADMIN — doxycycline (VIBRAMYCIN) 100 mg in sodium chloride 0.9 % (NS) 100 mL IVPB-MBP: 100 mg | INTRAVENOUS | @ 22:00:00 | Stop: 2024-06-17

## 2024-06-14 MED ADMIN — gabapentin (NEURONTIN) capsule 100 mg: 100 mg | ORAL | @ 13:00:00

## 2024-06-14 MED ADMIN — acetaminophen (TYLENOL) tablet 650 mg: 650 mg | ORAL | @ 07:00:00

## 2024-06-14 MED ADMIN — doxycycline (VIBRAMYCIN) 100 mg in sodium chloride 0.9 % (NS) 100 mL IVPB-MBP: 100 mg | INTRAVENOUS | @ 09:00:00 | Stop: 2024-06-17

## 2024-06-14 MED ADMIN — fluticasone furoate-vilanterol (BREO ELLIPTA) 100-25 mcg/dose inhaler 1 puff: 1 | RESPIRATORY_TRACT | @ 12:00:00

## 2024-06-14 MED ADMIN — albuterol 2.5 mg /3 mL (0.083 %) nebulizer solution 2.5 mg: 2.5 mg | RESPIRATORY_TRACT | @ 16:00:00

## 2024-06-14 MED ADMIN — albuterol 2.5 mg /3 mL (0.083 %) nebulizer solution 2.5 mg: 2.5 mg | RESPIRATORY_TRACT | @ 20:00:00

## 2024-06-14 MED ADMIN — pantoprazole (Protonix) EC tablet 20 mg: 20 mg | ORAL | @ 13:00:00

## 2024-06-14 MED ADMIN — gabapentin (NEURONTIN) capsule 100 mg: 100 mg | ORAL | @ 01:00:00

## 2024-06-14 MED ADMIN — apixaban (ELIQUIS) tablet 2.5 mg: 2.5 mg | ORAL | @ 01:00:00

## 2024-06-14 MED ADMIN — apixaban (ELIQUIS) tablet 2.5 mg: 2.5 mg | ORAL | @ 13:00:00

## 2024-06-14 MED ADMIN — acetaminophen (TYLENOL) tablet 650 mg: 650 mg | ORAL | @ 20:00:00

## 2024-06-14 MED ADMIN — albuterol 2.5 mg /3 mL (0.083 %) nebulizer solution 2.5 mg: 2.5 mg | RESPIRATORY_TRACT | @ 01:00:00

## 2024-06-14 MED ADMIN — cefTRIAXone (ROCEPHIN) 1 g in sodium chloride 0.9 % (NS) 100 mL IVPB-MBP: 1 g | INTRAVENOUS | @ 21:00:00 | Stop: 2024-06-17

## 2024-06-14 MED ADMIN — doxycycline (VIBRAMYCIN) 100 mg in sodium chloride 0.9 % (NS) 100 mL IVPB-MBP: 100 mg | INTRAVENOUS | @ 01:00:00 | Stop: 2024-06-17

## 2024-06-14 MED ADMIN — midodrine (PROAMATINE) tablet 5 mg: 5 mg | ORAL | @ 17:00:00

## 2024-06-14 MED ADMIN — amiodarone (PACERONE) tablet 200 mg: 200 mg | ORAL | @ 13:00:00

## 2024-06-14 MED ADMIN — tafamidis cap 61 mg capsule **Patient Supplied**: 61 mg | ORAL | @ 13:00:00

## 2024-06-14 MED ADMIN — oxyCODONE (ROXICODONE) immediate release tablet 5 mg: 5 mg | ORAL | @ 13:00:00 | Stop: 2024-06-27

## 2024-06-14 NOTE — Unmapped (Signed)
 OCCUPATIONAL THERAPY  Evaluation (06/14/24 1128)    Patient Name:  Barbara Huber       Medical Record Number: [id number]     Date of Birth: 07-Nov-1941  Sex: Female      Post-Discharge Occupational Therapy Recommendations: 3x weekly          Equipment Recommendation  OT DME Recommendations: None       OT Treatment Diagnosis: Generalized muscle weakness, Need for assistance with personal care, Reduced mobility, Limitation of activities due to disability         Assessment  Problem List: Decreased activity tolerance, Decreased endurance, Fall risk, Decreased strength, Decreased mobility, Impaired ADLs, Gait deviation, Decreased safety awareness, Impaired judgement, Impaired balance, Impaired sensation        Clinical Decision Making: Moderate Complexity    Assessment: Barbara Huber is a 83 y.o. female who is presenting to [facility] with Dyspnea on exertion, in the setting of the following pertinent/contributing co-morbidities: Pulmonary hypertension, COPD, chronic hypoxic respiratory failure, heart failure with preserved ejection fraction, persistent atrial fibrillation on amiodarone .  Patient presents to OT with below baseline functional activities impacting pt's independence with ADLs and functional mobility/transfers compared to baseline. Patient educated on purpose and goal of OT.  Patient completing sit to stand from recliner with mod assist, functional ambulation ~ 10 ft with use of RW with OT assist with maneuvering of RW, pt performed functional t/f to [facility] with minimal assist with stand pivot transfer with use of arms of BSC for support (pt's daughter present and states this is how she does this at home) this date. Pt exhibiting easy fatigue, SOB, decreased (I) with ADLs, decreased (I) with sit to stand and functional mobility. After review of the patient's occupational profile and history, assessment of occupational performance, clinical decision making, and development of POC, the patient presents as a mod complexity case. Recommend post acute 3x with assistance with ADLs. DME: none    Today's Interventions: ADL retraining, Balance activities, Bed mobility, Compensatory tech. training, Conservation, Education - Patient, Home exercise program, Functional mobility, Endurance activities, Postular / Proximal stability, Safety education, Transfer training  Today's Interventions: Role of OT and POC, activity tolerance, energy conservation, standing balance/tolerance, safety awareness, functional mobility/transfers    Activity Tolerance During Today's Session  Limited by fatigue    Plan  Planned Frequency of Treatment: Plan of Care Initiated: 06/14/24  1-2x per day Weekly Frequency: 3-4 days per week  Planned Treatment Duration: 06/28/24    Planned Interventions:  Education (Patient/Family/Caregiver), Home Exercise Program, Therapeutic Activity, Therapeutic Exercise, Self-Care/Home Training      GOALS:   Patient and Family Goals: none stated    Short Term:   SHORT GOAL #1: Pt to perform oral care/grooming with sba   Time Frame : 2 weeks  SHORT GOAL #2: Pt to perform full body dressing with minimal assist   Time Frame : 2 weeks  SHORT GOAL #3: Pt to perform functional t/f to commode with cga with use of LRAD, minimal assist with clothing management and toileting   Time Frame : 2 weeks                   Prognosis:  Good  Positive Indicators:  Role of OT and POC, activity tolerance, energy conservation, standing balnace/tolerance, pt/family education  Barriers to Discharge: Endurance deficits, Functional strength deficits, Impaired Balance, Gait instability, Severity of deficits, Decreased safety awareness, Inability to safely perform ADLS    Subjective  Medical Updates Since Last Visit/Relevant  PMH Affecting Clinical Decision Making:    Prior Functional Status Pt reports that her daughter assists with ADLs, she reports she ambulates with a rollator walker, endorses no falls, she lives with her daughter who works, but daughter states that they pay for a caregiver from 8:30-1:30, then pt's cousin and neice assist every other day from 2-4 pm.    Living Situation  Living Environment: House  Lives With: Daughter, Family  Home Living: One level home, Walk-in shower, Grab bars in shower, Built-in shower seat, Shower chair with back, Ramped entrance, Standard height toilet, Bedside commode  Caregiver Identified?: Yes  Caregiver Availability: Intermittent  Caregiver Ability: Supervision     Equipment available at home: Rollator, Bedside commode (shower chair with a back)    Medical Tests / Procedures: Reviewed in EPIC       Patient / Caregiver reports: pt and RN agreeable      Past Medical History[1] Social History     Tobacco Use    Smoking status: Former     Current packs/day: 0.00     Types: Cigarettes     Quit date: 2019     Years since quitting: 6.4    Smokeless tobacco: Never    Tobacco comments:     Quit a few years ago   Substance Use Topics    Alcohol use: No      Past Surgical History[2] Family History[3]     Nitrofurantoin and Lipitor  [atorvastatin ]     Objective Findings  Precautions / Restrictions  Falls precautions       Weight Bearing  Non-applicable    Required Braces or Orthoses  Non-applicable    Communication Preference  Verbal       Pain  pt with no c/o pain    Equipment / Environment  Vascular access (PIV, TLC, Port-a-cath, PICC), Supplemental oxygen  (3 L O2)         Cognition   Orientation Level:  Appropriate for age   Arousal/Alertness:  Appropriate responses to stimuli   Attention Span:  Appears intact   Memory:  Appears intact   Following Commands:  Follows multistep commands with increased time   Safety Judgment:  Good awareness of safety precautions   Awareness of Errors and Problem Solving:  Patient self-corrected errors   Comments:      Vision / Hearing   Vision: Wears glasses all the time     Hearing: No deficit identified         Hand Function:  Right Hand Function: Right hand grip strength, ROM and coordination WNL  Left Hand Function: Left hand grip strength, ROM and coordination WNL  Hand Dominance: Right    Skin Inspection:  Skin Inspection: Swelling (to B LE)    Face/Cervical ROM:  Face ROM: WFL  Cervical ROM: WFL    ROM / Strength:  UE ROM/Strength: Right Impaired/Limited, Left Impaired/Limited  RUE Impairment: Limited AROM, Reduced strength  LUE Impairment: Limited AROM, Reduced strength  UE ROM/ Strength Comment: R shoulder AROM 0-~ 80*, L shoulder AROOM 0-~90*.  Pt reports she has arthritis all over  LE ROM/Strength: Right Impaired/Limited, Left Impaired/Limited  RLE Impairment: Reduced strength  LLE Impairment: Reduced strength    Coordination:  Coordination: WFL    Sensation:  RUE Sensation: RUE intact  LUE Sensation: LUE intact  RLE Sensation: RLE impaired  RLE Sensation Impairment: chronic peripheral neuropathy  LLE Sensation: LLE impaired  LLE Sensation Impairment: chronic peripheral neuropathy    Balance:  Static Sitting-Level  of Assistance: Contact guard  Dynamic Sitting-Level of Assistance: Minimum assistance    Static Standing-Level of Assistance: Minimum assistance  Dynamic Standing - Level of Assistance: Moderate assistance    Functional Mobility  Transfers: Min assist  Bed Mobility - Needs Assistance: Min assist, Mod assist  Ambulation: pt ambulated ~ 10 ft with use of RW with cga, req OT for nasal cannula management    ADLs  ADLs: Needs assistance with ADLs  ADLs - Needs Assistance: LB dressing, UB dressing, Toileting  Toileting - Needs Assistance: Mod assist  UB Dressing - Needs Assistance: Min assist  LB Dressing - Needs Assistance: Max assist    Vitals / Orthostatics  Vitals/Orthostatics: NAD    Patient at end of session: All needs in reach, Friends/Family present, Notified Nurse, Lines intact, In chair     Occupational Therapy Session Duration  OT Individual [mins]: 34       AM-PAC-Daily Activity  Lower Body Dressing assistance needs: A lot - Maximum/Moderate Assistance  Bathing assistance needs: A lot - Maximum/Moderate Assistance  Toileting assistance needs: A lot - Maximum/Moderate Assistance  Upper Body Dressing assistance needs: A Little - Minimal/Contact Guard Assist/Supervision  Personal Grooming assistance needs: A Little - Minimal/Contact Guard Assist/Supervision  Eating Meals assistance needs: None - Modified Independent/Independent    Daily Activity Score: 16    Score (in points): % of Functional Impairment, Limitation, Restriction  6: 100% impaired, limited, restricted  7-8: At least 80%, but less than 100% impaired, limited restricted  9-13: At least 60%, but less than 80% impaired, limited restricted  14-19: At least 40%, but less than 60% impaired, limited restricted  20-22: At least 20%, but less than 40% impaired, limited restricted  23: At least 1%, but less than 20% impaired, limited restricted  24: 0% impaired, limited restricted      I attest that I have reviewed the above information.  Signed: Marly Schuld A Debi Cousin, OT  Filed 06/14/2024                 [1]   Past Medical History:  Diagnosis Date    Acute exacerbation of CHF (congestive heart failure)    08/19/2023    Acute kidney injury superimposed on chronic kidney disease 10/11/2020    Acute on chronic diastolic (congestive) heart failure    08/23/2020    Acute on chronic diastolic congestive heart failure    08/23/2020    AKI (acute kidney injury) 04/14/2015    Lab Results  Component  Value  Date     CREATININE  1.90 (H)  06/12/2021     Had a bump in her creatinine when she was taking her diuretics every day.  She is currently taking 40 mg daily of torsemide  and 50 mg of spironolactone .  Her volume status is fragile.  Previously when she stopped her diuretic she becomes short of breath.  Plan: We will check her BMP today.  We will likely have to go to 40    Anemia 08/22/2019    Iron  deficiency anemia          Lab Results      Component    Value    Date           WBC    9.2    03/21/2021           RBC    3.67 (L)    03/21/2021 HGB    9.9 (L)    03/21/2021  HCT    30.7 (L)    03/21/2021           MCV    83.7    03/21/2021           MCH    26.9    03/21/2021           MCHC    32.1    03/21/2021           RDW    21.9 (H)    03/21/2021           PLT    318     Arthritis     Calculus of kidney     Calculus of ureter     CHF (congestive heart failure)        Chronic atrial fibrillation    07/20/2019    COPD (chronic obstructive pulmonary disease)        Diabetes        Gangrenous cholecystitis 10/11/2020    Generalized edema  06/17/2021    GERD (gastroesophageal reflux disease)     Hydronephrosis     Hypertension     Hyponatremia 10/11/2020    Intermediate coronary syndrome    03/13/2014    Lower extremity edema 09/28/2020    Lumbar stenosis     Microscopic hematuria     Nausea alone     Nephrolithiasis 04/17/2016    Neuropathy     Nocturia     Nocturia 07/01/2017    Other chronic cystitis     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  See plans for anemia, AKI  We are also tapering her gabapentin .  Currently on 300 mg 3 times daily.  We will decrease it to twice daily, and then nightly.  She states she is not having any recurrence of her pain.    Pulmonary hypertension        Renal colic     Shortness of breath 04/28/2020    Sleep apnea     Unstable angina pectoris    03/13/2014   [2]   Past Surgical History:  Procedure Laterality Date    BACK SURGERY  1995    CARPAL TUNNEL RELEASE Left 2014    HYSTERECTOMY  1971    IR INSERT CHOLECYSTOSTOMY TUBE PERCUTANEOUS  10/02/2020    IR INSERT CHOLECYSTOSMY TUBE PERCUTANEOUS 10/02/2020 Judeth Notch, MD IMG VIR H&V [facility]    LUMBAR DISC SURGERY      PR REMOVAL GALLBLADDER N/A 10/06/2020    Procedure: CHOLECYSTECTOMY;  Surgeon: True Fuss, MD;  Location: MAIN OR [facility];  Service: Trauma    PR RIGHT HEART CATH O2 SATURATION & CARDIAC OUTPUT N/A 09/30/2020    Procedure: Right Heart Catheterization;  Surgeon: Penne Bowl, MD;  Location: [facility] CATH;  Service: Cardiology    PR RIGHT HEART CATH O2 SATURATION & CARDIAC OUTPUT N/A 01/09/2021    Procedure: Right Heart Catheterization;  Surgeon: Coralie Derrick, MD;  Location: [facility] CATH;  Service: Cardiology   [3]   Family History  Problem Relation Age of Onset    Hypertension Mother     Cancer Father         COLON CANCER    No Known Problems Sister     No Known Problems Sister     No Known Problems Brother     No Known Problems Brother     No Known Problems Maternal Aunt     No  Known Problems Maternal Uncle     No Known Problems Paternal Aunt     No Known Problems Paternal Uncle     No Known Problems Maternal Grandmother     No Known Problems Maternal Grandfather     No Known Problems Paternal Grandmother     No Known Problems Paternal Grandfather     No Known Problems Other     Anesthesia problems Neg Hx     Broken bones Neg Hx     Clotting disorder Neg Hx     Collagen disease Neg Hx     Diabetes Neg Hx     Dislocations Neg Hx     Fibromyalgia Neg Hx     Gout Neg Hx     Hemophilia Neg Hx     Osteoporosis Neg Hx     Rheumatologic disease Neg Hx     Scoliosis Neg Hx     Severe sprains Neg Hx     Sickle cell anemia Neg Hx     Spinal Compression Fracture Neg Hx     GU problems Neg Hx     Kidney cancer Neg Hx     Prostate cancer Neg Hx

## 2024-06-14 NOTE — Unmapped (Signed)
 Shift Summary  Skin protection measures were consistently applied to prevent hospital-acquired skin injuries.    Peripheral IV sites were assessed and maintained without the need for intervention.    Toileting assistance was provided every two hours to prevent falls.    Hourly visual checks were conducted throughout the shift, with no incidents reported.    Overall, the patient remained stable with no signs of infection or fall-related injuries.     Absence of Hospital-Acquired Illness or Injury: Skin protection measures were consistently applied, with limited adhesive use and regular use of incontinence pads, indicating proactive prevention of skin breakdown.     Absence of Infection Signs and Symptoms: Temperature remained stable and within normal range throughout the shift, and acetaminophen  was administered for fever management, although no fever was present.     Absence of Fall and Fall-Related Injury: Fall risk interventions, including hourly visual checks and toileting every two hours, were consistently implemented, and no falls or injuries were reported.

## 2024-06-14 NOTE — Unmapped (Signed)
 PHYSICAL THERAPY  Evaluation (06/14/24 1507)          Patient Name:  Barbara Huber       Medical Record Number: [id number]   Date of Birth: September 22, 1941  Sex: Female        Post-Discharge Physical Therapy Recommendations:  PT Post Acute Discharge Recommendations: Skilled PT services indicated, 3x weekly, Supervision for all mobility due to fall risk   Equipment Recommendation  PT DME Recommendations: Rolling walker          Treatment Diagnosis: Abnormalities of gait and mobility, Difficulty in walking, Generalized muscle weakness, Unsteadiness on feet        Activity Tolerance: Limited by fatigue     ASSESSMENT  Problem List: Decreased mobility, Decreased strength, Decreased range of motion, Impaired balance, Pain, Decreased endurance, Gait deviation, Impaired ADLs, Fall risk      Assessment : Barbara Huber is a 83 y.o. female who is presenting to [facility] with Dyspnea on exertion, in the setting of the following pertinent/contributing co-morbidities: Pulmonary hypertension, COPD, chronic hypoxic respiratory failure, heart failure with preserved ejection fraction, persistent atrial fibrillation on amiodarone .      Prior to admission pt was mod I with rollator for limited household ambulation, has strong family support between multiple family members to assist with ADLs. Pt currently mobilizing under baseline function, presenting to PT with impairments in functional strength, balance, endurance, and activity tolerance. Received pt on BSC, assisted pt with stand-pivot transfer BSC>bed with minA. Mod/maxA to return to supine and boost in bed. Pt required increased time throughout to complete tasks 2/2 generalized fatigue and baseline arthritic pain. Pt will benefit from continued therapy services while in-house to address mobility impairments as listed above. Given CLOF, currently recommending 3x with family for post-acute therapy services to facilitate return to PLOF.     Today's Interventions: Balance activities, Endurance activities, Gait training, Patient/Family/Caregiver Education, Positioning, Therapeutic activity  Today's Interventions: PT eval, STS transfers, amb, bed mobility, Ed re: POC, goals of care, falls prevention, progressive mobility     Personal Factors/Comorbidities Present: 3+   Examination of Body System: Musculoskeletal, Activity/participation, Cardiovascular, Pulmonary  Clinical Presentation: Evolving    Clinical Decision Making: Moderate        PLAN  Planned Frequency of Treatment: Plan of Care Initiated: 06/14/24  1-2x per day Weekly Frequency: 3-4 days per week        Planned Interventions: Education (Patient/Family/Caregiver), Gait training, Home exercise program, Neuromuscular re-education, Self-care / Home Management training, Therapeutic Exercise, Therapeutic Activity     Goals:   Patient and Family Goals: improve mobility     SHORT GOAL #1: Pt will complete all bed mobility CGA               Time Frame : 2 weeks  SHORT GOAL #2: Pt will complete all STS transfers CGA w LRAD              Time Frame : 2 weeks  SHORT GOAL #3: Pt will amb >50 ft., CGA w LRAD              Time Frame : 2 weeks                       Long Term Goal #1: Pt will return to PLOF  Time Frame: 2 months     Prognosis:  Good  Positive Indicators: home support, motivation  Barriers to Discharge: Endurance deficits, Functional strength deficits, Gait instability, Impaired Balance,  Pain     SUBJECTIVE  Communication Preference: Verbal     Patient reports: I don't want to get weak  Pain Comments: baseline chronic pain in joints 2/2 arthritis        Prior Functional Status: patient reports modified independence at home using rollator. Limited household ambulator at baseline, mostly ambulates ~20-30 ft bouts at most. Family assists with ADLs. Wears 3L O2 consistently at home  Living Situation  Living Environment: House  Lives With: Daughter, Family  Home Living: One level home, Walk-in shower, Grab bars in shower, Built-in shower seat, Shower chair with back, Ramped entrance, Standard height toilet, Bedside commode      Equipment available at home: Rollator, Bedside commode        Past Medical History[1]         Social History     Tobacco Use    Smoking status: Former     Current packs/day: 0.00     Types: Cigarettes     Quit date: 2019     Years since quitting: 6.4    Smokeless tobacco: Never    Tobacco comments:     Quit a few years ago   Substance Use Topics    Alcohol use: No       Past Surgical History[2]          Family History[3]     Allergies: Nitrofurantoin and Lipitor  [atorvastatin ]                  Objective Findings  Precautions / Restrictions  Precautions: Falls precautions  Weight Bearing Status: Non-applicable  Required Braces or Orthoses: Non-applicable     Medical Tests / Procedures: Reviewed in Epic  Equipment / Environment: Vascular access (PIV, TLC, Port-a-cath, PICC), Supplemental oxygen      Vitals/Orthostatics : VSS, NAD on 3LO2     Cognition: WFL  Cognition comment: alert and conversational  Visual/Perception: Wears Glasses/Contacts all the time  Hearing: No deficit identified     Skin Inspection: Intact where visualized     Upper Extremities  UE ROM: Right WFL, Left WFL  UE Strength: Right Impaired/Limited, Left Impaired/Limited  RUE Strength Impairment: Pain with movement  LUE Strength Impairment: Pain with movement  UE comment: Limited by baseline arthritis in shoulders    Lower Extremities  LE ROM: Right Impaired/Limited, Left Impaired/Limited  RLE ROM Impairment: Pain with movement  LLE ROM Impairment: Pain with movement  LE Strength: Right Impaired/Limited, Left Impaired/Limited  RLE Strength Impairment: Reduced strength, Pain with movement  LLE Strength Impairment: Reduced strength, Pain with movement  LE comment: globally >3/5 but weak compared to baseline. Limited by pain and arthritis          Sensation: WFL    Static Sitting-Level of Assistance: Independent  Dynamic Sitting-Level of Assistance: Supervision    Static Standing-Level of Assistance: Contact guard  Dynamic Standing - Level of Assistance: Minimum assistance      Bed Mobility: Supine to Sit  Supine to Sit assistance level: Moderate assist, patient does 50-74%  Bed Mobility comments: modA sit>sup for BLE management, increased time to laterally scoot, maxA to boost in bed     Transfers: Sit to Stand, Bed to Chair  Sit to Stand assistance level: Minimal assist, patient does 75% or more  Bed to Chair assistance level: Minimal assist, patient does 75% or more  Transfer comments: minA to stand from [facility], stand-pivot transfer BSC>bed. Use of UE on bed to support self as she turned around to sit.  Gait Level of Assistance: Minimal assist, patient does 75% or more  Gait Assistive Device:  (HHA)  Gait Distance Ambulated (ft): 2 ft  Skilled Treatment Performed: x2 ft, minA w HHA. fwd trunk lean, decreased clearance, cues for LE sequencing while turning     Stairs: NA            Endurance: fair    Patient at end of session: All needs in reach, Friends/Family present, In bed, Lines intact, Notified Nurse    Physical Therapy Session Duration  PT Individual [mins]: 36          AM-PAC 5 click  Help currently need turning over In bed?: A lot - Maximum/Moderate Assistance  Help currently needed sitting down/standing up from chair with arms? : A Little - Minimal/Contact Guard Assist/Supervision  Help currently needed moving from supine to sitting on edge of bed?: A lot - Maximum/Moderate Assistance  Help currently needed moving to and from bed from wheelchair?: A Little - Minimal/Contact Guard Assist/Supervision  Help currently needed walking in a hospital room?: A Little - Minimal/Contact Guard Assist/Supervision      Basic Mobility Score 5 click: 13    Score (in points): % of Functional Impairment, Limitation, Restriction  5: 100% impaired, limited, restricted  6-7: At least 80%, but less than 100% impaired, limited restricted  8-11: At least 60%, but less than 80% impaired, limited restricted  12-16: At least 40%, but less than 60% impaired, limited restricted  17-18: At least 20%, but less than 40% impaired, limited restricted  19: At least 1%, but less than 20% impaired, limited restricted  20: 0% impaired, limited restricted    'AM-PAC' forms are Copyright protected by [employer] [facility]         I attest that I have reviewed the above information.  Signed: Hassel Lins, PT  Filed 06/14/2024          [1]   Past Medical History:  Diagnosis Date    Acute exacerbation of CHF (congestive heart failure)    08/19/2023    Acute kidney injury superimposed on chronic kidney disease 10/11/2020    Acute on chronic diastolic (congestive) heart failure    08/23/2020    Acute on chronic diastolic congestive heart failure    08/23/2020    AKI (acute kidney injury) 04/14/2015    Lab Results  Component  Value  Date     CREATININE  1.90 (H)  06/12/2021     Had a bump in her creatinine when she was taking her diuretics every day.  She is currently taking 40 mg daily of torsemide  and 50 mg of spironolactone .  Her volume status is fragile.  Previously when she stopped her diuretic she becomes short of breath.  Plan: We will check her BMP today.  We will likely have to go to 40    Anemia 08/22/2019    Iron  deficiency anemia          Lab Results      Component    Value    Date           WBC    9.2    03/21/2021           RBC    3.67 (L)    03/21/2021           HGB    9.9 (L)    03/21/2021           HCT  30.7 (L)    03/21/2021           MCV    83.7    03/21/2021           MCH    26.9    03/21/2021           MCHC    32.1    03/21/2021           RDW    21.9 (H)    03/21/2021           PLT    318     Arthritis     Calculus of kidney     Calculus of ureter     CHF (congestive heart failure)        Chronic atrial fibrillation    07/20/2019    COPD (chronic obstructive pulmonary disease)        Diabetes        Gangrenous cholecystitis 10/11/2020    Generalized edema 06/17/2021    GERD (gastroesophageal reflux disease)     Hydronephrosis     Hypertension     Hyponatremia 10/11/2020    Intermediate coronary syndrome    03/13/2014    Lower extremity edema 09/28/2020    Lumbar stenosis     Microscopic hematuria     Nausea alone     Nephrolithiasis 04/17/2016    Neuropathy     Nocturia     Nocturia 07/01/2017    Other chronic cystitis     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  See plans for anemia, AKI  We are also tapering her gabapentin .  Currently on 300 mg 3 times daily.  We will decrease it to twice daily, and then nightly.  She states she is not having any recurrence of her pain.    Pulmonary hypertension        Renal colic     Shortness of breath 04/28/2020    Sleep apnea     Unstable angina pectoris    03/13/2014   [2]   Past Surgical History:  Procedure Laterality Date    BACK SURGERY  1995    CARPAL TUNNEL RELEASE Left 2014    HYSTERECTOMY  1971    IR INSERT CHOLECYSTOSTOMY TUBE PERCUTANEOUS  10/02/2020    IR INSERT CHOLECYSTOSMY TUBE PERCUTANEOUS 10/02/2020 Judeth Notch, MD IMG VIR H&V [facility]    LUMBAR DISC SURGERY      PR REMOVAL GALLBLADDER N/A 10/06/2020    Procedure: CHOLECYSTECTOMY;  Surgeon: True Fuss, MD;  Location: MAIN [facility];  Service: Trauma    PR RIGHT HEART CATH O2 SATURATION & CARDIAC OUTPUT N/A 09/30/2020    Procedure: Right Heart Catheterization;  Surgeon: Penne Bowl, MD;  Location: [facility] CATH;  Service: Cardiology    PR RIGHT HEART CATH O2 SATURATION & CARDIAC OUTPUT N/A 01/09/2021    Procedure: Right Heart Catheterization;  Surgeon: Coralie Derrick, MD;  Location: [facility] CATH;  Service: Cardiology   [3]   Family History  Problem Relation Age of Onset    Hypertension Mother     Cancer Father         COLON CANCER    No Known Problems Sister     No Known Problems Sister     No Known Problems Brother     No Known Problems Brother     No Known Problems Maternal Aunt     No Known Problems Maternal Uncle  No Known Problems Paternal Aunt     No Known Problems Paternal Uncle     No Known Problems Maternal Grandmother     No Known Problems Maternal Grandfather     No Known Problems Paternal Grandmother     No Known Problems Paternal Grandfather     No Known Problems Other     Anesthesia problems Neg Hx     Broken bones Neg Hx     Clotting disorder Neg Hx     Collagen disease Neg Hx     Diabetes Neg Hx     Dislocations Neg Hx     Fibromyalgia Neg Hx     Gout Neg Hx     Hemophilia Neg Hx     Osteoporosis Neg Hx     Rheumatologic disease Neg Hx     Scoliosis Neg Hx     Severe sprains Neg Hx     Sickle cell anemia Neg Hx     Spinal Compression Fracture Neg Hx     GU problems Neg Hx     Kidney cancer Neg Hx     Prostate cancer Neg Hx

## 2024-06-14 NOTE — Unmapped (Signed)
 Hospital Medicine Daily Progress Note    Assessment/Plan:    Principal Problem:    Bronchitis  Active Problems:    COPD (chronic obstructive pulmonary disease)       Sleep apnea in adult    Essential hypertension    Incomplete bladder emptying    Peripheral neuropathy    Chronic respiratory failure with hypoxia       Persistent atrial fibrillation       Chronic kidney disease ([name]), stage IV (severe)       Pulmonary hypertension       Chronic heart failure with preserved ejection fraction       Cardiac amyloidosis           Please see wound care flowsheets for wound documentation     LOS: 2 days (under inpatient status)    Barbara Huber is a 83 y.o. 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    # COPD with acute bronchitis  Fevers, cough, sputum point to infection though no lobar consolidation on CXR. 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 Does not look volume overloaded and is around dry weight, despite rising proBNP  -- continue midodrine  5 mg tid  -- continue tafamidis  (has home supply)  -- HOLD torsemide , spironolactone   -- echocardiogram to assess proxies for filling pressures, RV function    # Persistent Afib  -- continue amiodarone   -- continue apixaban  2.5 mg bid (meets criteria for dose reduction)    # [name] stage G4  Baseline sCr ~ 2.0 - 2.6. 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 Not medically ready for discharge. Anticipate discharge home possibly with [facility] services    PT/OT recommendations: (not recorded) / 3x weekly (06/14/24)  DME needs: (not recorded) / None (06/14/24)    I personally spent 35 minutes face-to-face and non-face-to-face in the care of this patient, which includes all pre, intra, and post visit time on the date of service.  All documented time was specific to the E/M visit and does not include any procedures that may have been performed.    Please page [facility]) [phone] with questions. This note reflects active problems and daily updates. Please refer to hospital course for additional details and resolved problems.  ___________________________________________________________________    Subjective:  Not sure if breathing is any better since admission  Still bringing up tan-colored sputum  No longer having chills  Reviewed possible overlapping contributors to symptoms (bronchospasm / infection / volume overload) and their relative importance    Discussed with nursing, case manager    Labs/Studies:  Labs and Studies from the last 24hrs per EMR and Reviewed    Objective:  Temp:  [36.4 ??C (97.5 ??F)-37.1 ??C (98.8 ??F)] 36.4 ??C (97.5 ??F)  Pulse:  [64-88] 67  SpO2 Pulse:  [71-73] 73  Resp:  [16-20] 16  BP: (93-131)/(40-93) 114/93  SpO2:  [94 %-100 %] 100 %    GEN: tired but not ill appearing woman lying in bed, not in distress  CV: irreg irreg, prominent S1, 2/6 systolic murmur, JVP mid-neck at 45 degrees  PULM: unlabored at rest on 3L [place], crackles at bases  ABD: soft, nontender, nondistended, +bowel sounds  EXT: ankles warm, trace ankle edema  NEURO: alert and oriented to person place time    ------------  Barbara Brookes, MD PhD  [facility] Medicine

## 2024-06-14 NOTE — Unmapped (Signed)
 Shift Summary  OxyCODONE  was administered in the morning for pain management, which initially decreased but later increased again.    Ambulation was achieved with assistance, indicating some mobility but requiring support for nasal cannula management.    Swelling was observed in the bilateral lower extremities during the skin inspection.    Bed mobility showed improvement with the patient able to move from supine to sit with moderate assistance.    Overall, the patient required moderate assistance for most activities, with some improvement in transfer abilities, and no falls or infections were noted.     Absence of Infection Signs and Symptoms: Temperature remained stable at 36.4 ??C (97.5 ??F) throughout the shift, and acetaminophen  was administered for fever management, indicating no significant signs of infection.     Absence of Fall and Fall-Related Injury: Regular toileting every two hours and hourly visual checks were maintained, with no falls or injuries reported during the shift.     Improved Ability to Complete Activities of Daily Living: Moderate assistance was consistently required for activities, with some improvement noted in bed to chair transfers requiring minimal assistance.

## 2024-06-14 NOTE — Unmapped (Signed)
 Care Management  Initial Transition Planning Assessment              General  Care Manager / Social Worker assessed the patient by : In person interview with patient, Discussion with Clinical Care team, Medical record review  Orientation Level: Oriented X4  Functional level prior to admission: Independent  Reason for referral: Discharge Planning    Contact/Decision Maker  Extended Emergency Contact Information  Primary Emergency Contact: Barbara Huber,Barbara Huber  Address: [address]           [place], [place] [address] [place]  Home Phone: [phone]  Mobile Phone: [phone]  Relation: Daughter    Legal Next of Kin / Guardian / POA / Advance Directives     HCDM (With Legal Document To Support): Barbara Huber,Barbara Huber - Daughter - [phone]    Advance Directive (Medical Treatment)  Does patient have an advance directive covering medical treatment?: Patient does not have advance directive covering medical treatment.  Reason patient does not have an advance directive covering medical treatment:: Patient does not wish to complete one at this time.    Health Care Decision Maker [HCDM] (Medical & Mental Health Treatment)  Healthcare Decision Maker: HCDM documented in the HCDM/Contact Info section.  Information offered on HCDM, Medical & Mental Health advance directives:: Patient declined information.    Advance Directive (Mental Health Treatment)  Does patient have an advance directive covering mental health treatment?: Patient does not have advance directive covering mental health treatment.  Reason patient does not have an advance directive covering mental health treatment:: Patient does not wish to complete one at this time.    Readmission Information    Have you been hospitalized in the last 30 days?: No      Patient Information  Lives with: Children    Type of Residence: Private residence        Location/Detail: [address] [place] [address]  Home Phone:  [phone]    Support Systems/Concerns: Children, Family Members    Responsibilities/Dependents at home?: No    Home Care services in place prior to admission?: Yes  Type of Home Care services in place prior to admission: Attendant  Current Home Care provider (Name/Phone #): private caregiver while daughter is working    Careers adviser in place prior to admission: Product manager detail (Name/Phone #): PCP Elise Guile    Equipment Currently Used at [employer]: oxygen , respiratory supplies, walker, rolling, commode chair, shower chair, hospital bed, wheelchair, manual  Current HME Agency (Name/Phone #): O2 @ 3lpm, nebulizer, [facility] [employer] [facility]    Currently receiving outpatient dialysis?: No       Financial Information       Need for financial assistance?: No       Social Determinants of Health  Social Determinants of Health were addressed in provider documentation.  Please refer to patient history.  Social Drivers of Health     Food Insecurity: No Food Insecurity (06/14/2024)    Hunger Vital Sign     Worried About Running Out of Food in the Last Year: Never true     Ran Out of Food in the Last Year: Never true   Tobacco Use: Medium Risk (06/14/2024)    Patient History     Smoking Tobacco Use: Former     Smokeless Tobacco Use: Never     Passive Exposure: Not on file   Transportation Needs: No Transportation Needs (06/14/2024)    PRAPARE - Transportation  Lack of Transportation (Medical): No     Lack of Transportation (Non-Medical): No   Alcohol Use: Not At Risk (03/18/2023)    Alcohol Use     How often do you have a drink containing alcohol?: Never     How many drinks containing alcohol do you have on a typical day when you are drinking?: 1 - 2     How often do you have 5 or more drinks on one occasion?: Never   Housing: Low Risk  (06/14/2024)    Housing     Within the past 12 months, have you ever stayed: outside, in a car, in a tent, in an overnight shelter, or temporarily in someone else's home (i.e. couch-surfing)?: No     Are you worried about losing your housing?: No   Physical Activity: Unknown (11/04/2017)    Received from [facility]    Exercise Vital Sign     Days of Exercise per Week: Patient declined     Minutes of Exercise per Session: Patient declined   Utilities: Low Risk  (11/02/2023)    Utilities     Within the past 12 months, have you been unable to get utilities (heat, electricity) when it was really needed?: No   Stress: Unknown (11/04/2017)    Received from [facility] - Occupational Stress Questionnaire     Feeling of Stress : Patient declined   Interpersonal Safety: Not At Risk (06/12/2024)    Interpersonal Safety     Unsafe Where You Currently Live: No     Physically Hurt by Anyone: No     Abused by Anyone: No   Substance Use: Low Risk  (06/17/2023)    Substance Use     In the past year, how often have you used prescription drugs for non-medical reasons?: Never     In the past year, how often have you used illegal drugs?: Never     In the past year, have you used any substance for non-medical reasons?: No   Intimate Partner Violence: Not At Risk (06/12/2024)    Humiliation, Afraid, Rape, and Kick questionnaire     Fear of Current or Ex-Partner: No     Emotionally Abused: No     Physically Abused: No     Sexually Abused: No   Social Connections: Unknown (11/04/2017)    Received from [facility]    Social Connection and Isolation Panel     Frequency of Communication with Friends and Family: Patient declined     Frequency of Social Gatherings with Friends and Family: Patient declined     Attends Religious Services: Patient declined     Database administrator or Organizations: Patient declined     Attends Banker Meetings: Patient declined     Marital Status: Patient declined   Physicist, medical Strain: Low Risk  (06/14/2024)    Overall Financial Resource Strain (CARDIA)     Difficulty of Paying Living Expenses: Not hard at all   Health Literacy: Medium Risk (11/18/2023)    Health Literacy     : Sometimes   Internet Connectivity: No Internet connectivity concern identified (11/18/2023)    Internet Connectivity     Do you have access to internet services: Yes     How do you connect to the internet: Personal Device at home     Is your internet connection strong enough for you to watch video  on your device without major problems?: Yes     Do you have enough data to get through the month?: Yes     Does at least one of the devices have a camera that you can use for video chat?: Yes       Complex Discharge Information    Is patient identified as a difficult/complex discharge?: No    Discharge Needs Assessment  Concerns to be Addressed: discharge planning, home safety    Clinical Risk Factors: > 65, New Diagnosis, Multiple Diagnoses (Chronic), Functional Limitations    Barriers to taking medications: No    Prior overnight hospital stay or [name] in last 90 days: No      Anticipated Changes Related to Illness: inability to care for self    Equipment Needed After Discharge: none    Discharge Facility/Level of Care Needs: other (see comments) (home with home health)    Readmission  Risk of Unplanned Readmission Score: UNPLANNED READMISSION SCORE: 24.66%  Predictive Model Details          25% (High)  Factor Value    Calculated 06/14/2024 16:03 21% Number of active inpatient medication orders 36    [place] Risk of Unplanned Readmission Model 12% Number of hospitalizations in last year 3     9% Number of ED visits in last six months 2     8% ECG/EKG order present in last 6 months     7% Latest BUN high (68 mg/dL)     6% Imaging order present in last 6 months     6% Age 83     5% Latest hemoglobin low (10.3 g/dL)     5% Phosphorous result present     4% Charlson Comorbidity Index 4     4% Diagnosis of deficiency anemia present     4% Active anticoagulant inpatient medication order present     4% Latest creatinine high (2.50 mg/dL)     3% Diagnosis of renal failure present     2% Future appointment scheduled     2% Current length of stay 1.945 days     1% Active ulcer inpatient medication order present      Readmitted Within the Last 30 Days? (No if blank)   Patient at risk for readmission?: Yes    Discharge Plan  Screen findings are: Discharge planning needs identified or anticipated (Comment). (Requested recommended home health)    Expected Discharge Date: 06/16/2024    Expected Transfer from Critical Care:      Quality data for continuing care services shared with patient and/or representative?: Yes  Patient and/or family were provided with choice of facilities / services that are available and appropriate to meet post hospital care needs?: Yes   List choices in order highest to lowest preferred, if applicable. : [facility] preferred    Initial Assessment complete?: Yes

## 2024-06-15 LAB — BASIC METABOLIC PANEL
ANION GAP: 15 mmol/L — ABNORMAL HIGH (ref 5–14)
BLOOD UREA NITROGEN: 71 mg/dL — ABNORMAL HIGH (ref 9–23)
BUN / CREAT RATIO: 29
CALCIUM: 9.2 mg/dL (ref 8.7–10.4)
CHLORIDE: 97 mmol/L — ABNORMAL LOW (ref 98–107)
CO2: 25.3 mmol/L (ref 20.0–31.0)
CREATININE: 2.46 mg/dL — ABNORMAL HIGH (ref 0.55–1.02)
EGFR CKD-EPI (2021) FEMALE: 19 mL/min/1.73m2 — ABNORMAL LOW (ref >=60)
GLUCOSE RANDOM: 117 mg/dL (ref 70–179)
POTASSIUM: 3.4 mmol/L (ref 3.4–4.8)
SODIUM: 137 mmol/L (ref 135–145)

## 2024-06-15 LAB — CBC
HEMATOCRIT: 29 % — ABNORMAL LOW (ref 34.0–44.0)
HEMOGLOBIN: 9.7 g/dL — ABNORMAL LOW (ref 11.3–14.9)
MEAN CORPUSCULAR HEMOGLOBIN CONC: 33.3 g/dL (ref 32.0–36.0)
MEAN CORPUSCULAR HEMOGLOBIN: 30.6 pg (ref 25.9–32.4)
MEAN CORPUSCULAR VOLUME: 91.9 fL (ref 77.6–95.7)
MEAN PLATELET VOLUME: 8.6 fL (ref 6.8–10.7)
PLATELET COUNT: 158 10*9/L (ref 150–450)
RED BLOOD CELL COUNT: 3.15 10*12/L — ABNORMAL LOW (ref 3.95–5.13)
RED CELL DISTRIBUTION WIDTH: 14.5 % (ref 12.2–15.2)
WBC ADJUSTED: 17.6 10*9/L — ABNORMAL HIGH (ref 3.6–11.2)

## 2024-06-15 LAB — MAGNESIUM: MAGNESIUM: 2.3 mg/dL (ref 1.6–2.6)

## 2024-06-15 MED ADMIN — sodium chloride 3 % NEBULIZER solution 4 mL: 4 mL | RESPIRATORY_TRACT | @ 20:00:00

## 2024-06-15 MED ADMIN — pantoprazole (Protonix) EC tablet 20 mg: 20 mg | ORAL | @ 12:00:00

## 2024-06-15 MED ADMIN — midodrine (PROAMATINE) tablet 5 mg: 5 mg | ORAL | @ 17:00:00

## 2024-06-15 MED ADMIN — albuterol 2.5 mg /3 mL (0.083 %) nebulizer solution 2.5 mg: 2.5 mg | RESPIRATORY_TRACT | @ 17:00:00

## 2024-06-15 MED ADMIN — torsemide (DEMADEX) tablet 80 mg: 80 mg | ORAL | @ 16:00:00

## 2024-06-15 MED ADMIN — midodrine (PROAMATINE) tablet 5 mg: 5 mg | ORAL | @ 12:00:00

## 2024-06-15 MED ADMIN — midodrine (PROAMATINE) tablet 5 mg: 5 mg | ORAL | @ 21:00:00

## 2024-06-15 MED ADMIN — oxyCODONE (ROXICODONE) immediate release tablet 5 mg: 5 mg | ORAL | @ 02:00:00 | Stop: 2024-06-27

## 2024-06-15 MED ADMIN — acetaminophen (TYLENOL) tablet 650 mg: 650 mg | ORAL | @ 01:00:00

## 2024-06-15 MED ADMIN — gabapentin (NEURONTIN) capsule 100 mg: 100 mg | ORAL | @ 12:00:00

## 2024-06-15 MED ADMIN — doxycycline (VIBRA-TABS) tablet 100 mg: 100 mg | ORAL | @ 21:00:00 | Stop: 2024-06-17

## 2024-06-15 MED ADMIN — apixaban (ELIQUIS) tablet 2.5 mg: 2.5 mg | ORAL | @ 01:00:00

## 2024-06-15 MED ADMIN — acetaminophen (TYLENOL) tablet 650 mg: 650 mg | ORAL | @ 08:00:00

## 2024-06-15 MED ADMIN — tafamidis cap 61 mg capsule **Patient Supplied**: 61 mg | ORAL | @ 12:00:00

## 2024-06-15 MED ADMIN — doxycycline (VIBRAMYCIN) 100 mg in sodium chloride 0.9 % (NS) 100 mL IVPB-MBP: 100 mg | INTRAVENOUS | @ 10:00:00 | Stop: 2024-06-15

## 2024-06-15 MED ADMIN — umeclidinium (INCRUSE ELLIPTA) 62.5 mcg/actuation inhaler 1 puff: 1 | RESPIRATORY_TRACT | @ 12:00:00

## 2024-06-15 MED ADMIN — albuterol 2.5 mg /3 mL (0.083 %) nebulizer solution 2.5 mg: 2.5 mg | RESPIRATORY_TRACT | @ 20:00:00

## 2024-06-15 MED ADMIN — amiodarone (PACERONE) tablet 200 mg: 200 mg | ORAL | @ 12:00:00

## 2024-06-15 MED ADMIN — apixaban (ELIQUIS) tablet 2.5 mg: 2.5 mg | ORAL | @ 12:00:00

## 2024-06-15 MED ADMIN — gabapentin (NEURONTIN) capsule 100 mg: 100 mg | ORAL | @ 01:00:00

## 2024-06-15 MED ADMIN — spironolactone (ALDACTONE) split tablet 12.5 mg: 12.5 mg | ORAL | @ 16:00:00

## 2024-06-15 MED ADMIN — albuterol 2.5 mg /3 mL (0.083 %) nebulizer solution 2.5 mg: 2.5 mg | RESPIRATORY_TRACT | @ 12:00:00

## 2024-06-15 MED ADMIN — albuterol 2.5 mg /3 mL (0.083 %) nebulizer solution 2.5 mg: 2.5 mg | RESPIRATORY_TRACT | @ 01:00:00

## 2024-06-15 MED ADMIN — acetaminophen (TYLENOL) tablet 650 mg: 650 mg | ORAL | @ 16:00:00

## 2024-06-15 MED ADMIN — fluticasone furoate-vilanterol (BREO ELLIPTA) 100-25 mcg/dose inhaler 1 puff: 1 | RESPIRATORY_TRACT | @ 12:00:00

## 2024-06-15 MED ADMIN — cefdinir (OMNICEF) capsule 300 mg: 300 mg | ORAL | @ 18:00:00 | Stop: 2024-06-17

## 2024-06-15 NOTE — Unmapped (Signed)
 WOCN Consult Services                                                                 Wound Evaluation     Reason for Consult:   - Incontinence Associated Dermatitis  - Initial  - Moisture Associated Skin Damage    Problem List:   Principal Problem:    Bronchitis  Active Problems:    COPD (chronic obstructive pulmonary disease)       Sleep apnea in adult    Essential hypertension    Incomplete bladder emptying    Peripheral neuropathy    Chronic respiratory failure with hypoxia       Persistent atrial fibrillation       Chronic kidney disease ([name]), stage IV (severe)       Pulmonary hypertension       Chronic heart failure with preserved ejection fraction       Cardiac amyloidosis       Assessment: Per EMR Barbara Huber is a 83 y.o. 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     CWOCN consulted for a pressure injury with no location noted. 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 Nurse reports there are no pressure injuries that need to be assessed. Recommend to continue moisture management with crusting/zinc per Skin Integrity Protocol. WOC team will sign off at this time. Please re-consult if wound deteriorates and treatment plan is no longer working for wound.              WOCN Recommendations:   - See nursing orders for wound care instructions.  - Contact WOCN with questions, concerns, or wound deterioration.    Topical Therapy/Interventions:   - Crusting (stoma powder or antifungal powder)  - Zinc oxide barrier cream     Recommended Consults:  - Not Applicable    WOCN Follow Up:  - We will sign off at this time    Plan of Care Discussed With:   - RN Glori Lard, RN    Supplies Ordered: No    Workup Time:   15 minutes    Debby Fake BSN, RN, [employer]  Wound Ostomy Consult Service   Vocera or [employer] when available

## 2024-06-15 NOTE — Unmapped (Signed)
 Shift Summary  Acetaminophen  was administered for fever management, maintaining comfort.    Antibiotics cefdinir  and doxycycline  were administered to address infection concerns.    Safety measures were consistently applied to prevent falls, including hourly checks and toileting schedules.    Assistance levels varied across ADLs, with improvement noted in personal grooming tasks.    Overall, the patient's status remained stable with effective management of comfort, infection prevention, and fall risk.     Optimal Comfort and Wellbeing: Pain levels remained at 0 throughout the shift, with no complaints of pain except for lower back discomfort when supine, which was managed with repositioning.     Absence of Infection Signs and Symptoms: Temperature slightly decreased from 36.8 ??C to 36.6 ??C, remaining within normal limits, and cefdinir  and doxycycline  were administered to address infection concerns.     Absence of Fall and Fall-Related Injury: Hourly visual checks and toileting every two hours were consistently performed, and safety devices were used for transfers, preventing any falls or injuries.     Improved Ability to Complete Activities of Daily Living: Moderate assistance was required for mobility, but personal grooming was performed with minimal assistance, indicating some improvement in ADLs.     Skin Health and Integrity: Skin integrity was maintained with limited adhesive use and pressure reduction techniques, although MASD was noted on the sacrum.

## 2024-06-15 NOTE — Unmapped (Addendum)
 Hospital Medicine Daily Progress Note    Assessment/Plan:    Principal Problem:    Bronchitis  Active Problems:    COPD (chronic obstructive pulmonary disease)       Sleep apnea in adult    Essential hypertension    Incomplete bladder emptying    Peripheral neuropathy    Chronic respiratory failure with hypoxia       Persistent atrial fibrillation       Chronic kidney disease ([name]), stage IV (severe)       Pulmonary hypertension       Chronic heart failure with preserved ejection fraction       Cardiac amyloidosis           Please see wound care flowsheets for wound documentation     LOS: 3 days (under inpatient status)    Barbara Huber is a 83 y.o. 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    # COPD with acute bronchitis  Fevers, cough, sputum point to infection though no lobar consolidation on CXR. Constitutional symptoms and lung exam improving. 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 echocardiogram reviewed, moderate pHTN but no new valvular disease or new RV dysfunction, mild elevated RA pressures  -- continue midodrine  5 mg tid  -- continue tafamidis  (has home supply)  -- RESUME torsemide , spironolactone     # Persistent Afib  Multiple attempts to restore sinus rhythm have not produced durable result. Developed low-output state with attempts to up-titrate metoprolol  in the past (2022) which is not surprising in context of amyloid  -- continue amiodarone   -- continue apixaban  2.5 mg bid (meets criteria for dose reduction)  -- repeat EKG to confirm rhythm / intervals    # [name] stage G4  Baseline sCr ~ 2.0 - 2.6. 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 Anticipate discharge Friday 06/16/2024 if stable with resumption of diuretics    PT/OT recommendations: Skilled PT services indicated, 3x weekly, Supervision for all mobility due to fall risk (06/14/24) / 3x weekly (06/14/24)  DME needs: Rolling walker (06/14/24) / None (06/14/24)    I personally spent 50 minutes face-to-face and non-face-to-face in the care of this patient, which includes all pre, intra, and post visit time on the date of service.  All  documented time was specific to the E/M visit and does not include any procedures that may have been performed.    Please page [facility]) [phone] with questions. This note reflects active problems and daily updates. Please refer to hospital course for additional details and resolved problems.    ___________________________________________________________________    Subjective:  Worked with PT / OT yesterday afternoon, did well  Feeling somewhat better and more energetic overall  Still coughing a lot but bringing up less sputum  Did not wear AVAPS overnight because coughing too frequently    Discussed with nursing, case manager, outpatient cardiology APP    Labs/Studies:  Labs and Studies from the last 24hrs per EMR and Reviewed    Objective:  Temp:  [36.6 ??C (97.9 ??F)-37.1 ??C (98.8 ??F)] 36.8 ??C (98.2 ??F)  Pulse:  [65-85] 71  Resp:  [16-20] 20  BP: (109-129)/(43-67) 109/60  SpO2:  [92 %-97 %] 95 %    GEN: elderly woman propped up in bed, not in distress  CV: irreg irreg, prominent S1, 2/6 systolic murmur, JVP mid-neck at 45 degrees unchanged  PULM: unlabored at rest on 3L [place], crackles R>L base are improved  ABD: soft, nontender, nondistended, +bowel sounds  EXT: ankles warm, trace ankle edema  NEURO: alert and oriented to person place time    ------------  Betha Brookes, MD PhD  [facility]

## 2024-06-15 NOTE — Unmapped (Signed)
 Shift Summary  Oxycodone  was administered, resulting in a significant decrease in pain levels.    Skin assessment indicated bruising and moisture-associated dermatitis on the sacrum, with interventions in place to manage skin integrity.    The patient required moderate assistance for activities of daily living, with no change in independence.    Positioning supports and frequent weight shifts were utilized to maintain skin health.    Overall, the patient maintained stable skin health with effective pain management.     Improved Ability to Complete Activities of Daily Living: Assistance level remained consistent throughout the shift, with moderate assistance required for general hygiene and bathing. There was no change in the ability to perform activities of daily living, indicating no improvement in independence.     Skin Health and Integrity: Skin integrity showed signs of bruising and moisture-associated dermatitis on the sacrum. Positioning supports and frequent weight shifts were encouraged to maintain skin health, and the patient was able to turn herself.

## 2024-06-16 DIAGNOSIS — E0590 Thyrotoxicosis, unspecified without thyrotoxic crisis or storm: Principal | ICD-10-CM

## 2024-06-16 LAB — BASIC METABOLIC PANEL
ANION GAP: 17 mmol/L — ABNORMAL HIGH (ref 5–14)
BLOOD UREA NITROGEN: 72 mg/dL — ABNORMAL HIGH (ref 9–23)
BUN / CREAT RATIO: 30
CALCIUM: 9.5 mg/dL (ref 8.7–10.4)
CHLORIDE: 96 mmol/L — ABNORMAL LOW (ref 98–107)
CO2: 24.7 mmol/L (ref 20.0–31.0)
CREATININE: 2.42 mg/dL — ABNORMAL HIGH (ref 0.55–1.02)
EGFR CKD-EPI (2021) FEMALE: 20 mL/min/{1.73_m2} — ABNORMAL LOW (ref >=60)
GLUCOSE RANDOM: 102 mg/dL (ref 70–179)
POTASSIUM: 3.8 mmol/L (ref 3.4–4.8)
SODIUM: 138 mmol/L (ref 135–145)

## 2024-06-16 LAB — CBC
HEMATOCRIT: 30.4 % — ABNORMAL LOW (ref 34.0–44.0)
HEMOGLOBIN: 10 g/dL — ABNORMAL LOW (ref 11.3–14.9)
MEAN CORPUSCULAR HEMOGLOBIN CONC: 32.9 g/dL (ref 32.0–36.0)
MEAN CORPUSCULAR HEMOGLOBIN: 30.4 pg (ref 25.9–32.4)
MEAN CORPUSCULAR VOLUME: 92.3 fL (ref 77.6–95.7)
MEAN PLATELET VOLUME: 9.1 fL (ref 6.8–10.7)
PLATELET COUNT: 186 10*9/L (ref 150–450)
RED BLOOD CELL COUNT: 3.29 10*12/L — ABNORMAL LOW (ref 3.95–5.13)
RED CELL DISTRIBUTION WIDTH: 14.8 % (ref 12.2–15.2)
WBC ADJUSTED: 15.7 10*9/L — ABNORMAL HIGH (ref 3.6–11.2)

## 2024-06-16 LAB — MAGNESIUM: MAGNESIUM: 2.3 mg/dL (ref 1.6–2.6)

## 2024-06-16 MED ORDER — METHIMAZOLE 5 MG TABLET
ORAL_TABLET | Freq: Every day | ORAL | 0 refills | 30.00000 days
Start: 2024-06-16 — End: 2025-06-16

## 2024-06-16 MED ORDER — FLUTICASONE PROPIONATE 115 MCG-SALMETEROL 21 MCG/ACTUATION HFA INHALER
Freq: Two times a day (BID) | RESPIRATORY_TRACT | 11 refills | 30.00000 days
Start: 2024-06-16 — End: 2025-06-16

## 2024-06-16 MED ADMIN — amiodarone (PACERONE) tablet 200 mg: 200 mg | ORAL | @ 13:00:00 | Stop: 2024-06-16

## 2024-06-16 MED ADMIN — sodium chloride 3 % NEBULIZER solution 4 mL: 4 mL | RESPIRATORY_TRACT | @ 12:00:00 | Stop: 2024-06-16

## 2024-06-16 MED ADMIN — midodrine (PROAMATINE) tablet 5 mg: 5 mg | ORAL | @ 13:00:00 | Stop: 2024-06-16

## 2024-06-16 MED ADMIN — fluticasone furoate-vilanterol (BREO ELLIPTA) 100-25 mcg/dose inhaler 1 puff: 1 | RESPIRATORY_TRACT | @ 12:00:00 | Stop: 2024-06-16

## 2024-06-16 MED ADMIN — umeclidinium (INCRUSE ELLIPTA) 62.5 mcg/actuation inhaler 1 puff: 1 | RESPIRATORY_TRACT | @ 12:00:00 | Stop: 2024-06-16

## 2024-06-16 MED ADMIN — gabapentin (NEURONTIN) capsule 100 mg: 100 mg | ORAL | @ 13:00:00 | Stop: 2024-06-16

## 2024-06-16 MED ADMIN — albuterol 2.5 mg /3 mL (0.083 %) nebulizer solution 2.5 mg: 2.5 mg | RESPIRATORY_TRACT | @ 01:00:00

## 2024-06-16 MED ADMIN — spironolactone (ALDACTONE) split tablet 12.5 mg: 12.5 mg | ORAL | @ 13:00:00 | Stop: 2024-06-16

## 2024-06-16 MED ADMIN — doxycycline (VIBRA-TABS) tablet 100 mg: 100 mg | ORAL | @ 10:00:00 | Stop: 2024-06-16

## 2024-06-16 MED ADMIN — torsemide (DEMADEX) tablet 80 mg: 80 mg | ORAL | @ 13:00:00 | Stop: 2024-06-16

## 2024-06-16 MED ADMIN — acetaminophen (TYLENOL) tablet 650 mg: 650 mg | ORAL | @ 13:00:00 | Stop: 2024-06-16

## 2024-06-16 MED ADMIN — gabapentin (NEURONTIN) capsule 100 mg: 100 mg | ORAL | @ 01:00:00

## 2024-06-16 MED ADMIN — albuterol 2.5 mg /3 mL (0.083 %) nebulizer solution 2.5 mg: 2.5 mg | RESPIRATORY_TRACT | @ 12:00:00 | Stop: 2024-06-16

## 2024-06-16 MED ADMIN — apixaban (ELIQUIS) tablet 2.5 mg: 2.5 mg | ORAL | @ 13:00:00 | Stop: 2024-06-16

## 2024-06-16 MED ADMIN — cefdinir (OMNICEF) capsule 300 mg: 300 mg | ORAL | @ 13:00:00 | Stop: 2024-06-16

## 2024-06-16 MED ADMIN — pantoprazole (Protonix) EC tablet 20 mg: 20 mg | ORAL | @ 13:00:00 | Stop: 2024-06-16

## 2024-06-16 MED ADMIN — acetaminophen (TYLENOL) tablet 650 mg: 650 mg | ORAL | @ 01:00:00

## 2024-06-16 MED ADMIN — oxyCODONE (ROXICODONE) immediate release tablet 5 mg: 5 mg | ORAL | @ 01:00:00 | Stop: 2024-06-27

## 2024-06-16 MED ADMIN — apixaban (ELIQUIS) tablet 2.5 mg: 2.5 mg | ORAL | @ 01:00:00

## 2024-06-16 MED ADMIN — sodium chloride 3 % NEBULIZER solution 4 mL: 4 mL | RESPIRATORY_TRACT | @ 01:00:00

## 2024-06-16 MED ADMIN — tafamidis cap 61 mg capsule **Patient Supplied**: 61 mg | ORAL | @ 13:00:00 | Stop: 2024-06-16

## 2024-06-16 NOTE — Unmapped (Signed)
 Shift Summary  Neurological assessment confirmed alertness and full orientation.    Respiratory assessment noted exceptions to WDL, indicating potential respiratory concerns.    Overall, the patient maintained stable vital signs and showed improvement in pain management, with no falls or hospital-acquired infections reported.     Absence of Hospital-Acquired Illness or Injury: Skin integrity showed bruising, but no other hospital-acquired injuries were noted during the shift.     Readiness for Transition of Care: The unplanned readmission score was recorded, but no further data on readiness for transition was provided.     Absence of Infection Signs and Symptoms: Temperature remained stable and below fever threshold, indicating no signs of infection.     Absence of Fall and Fall-Related Injury: Fall risk interventions were in place, including hourly visual checks and safety devices for transfers, with no falls reported.

## 2024-06-16 NOTE — Unmapped (Signed)
 Physician Discharge Summary [facility]  [facility] BT[facility]  [address]  [address] [place] [address]  Dept: [phone]  Loc: [phone]     Identifying Information:   Barbara Huber  [birth year]  [id number]    Primary Care Physician: Barbara Velia Canales, MD   Code Status: Full Code    Admit Date: 06/12/2024    Discharge Date: 06/16/2024     Discharge To: Home with Home Health and/or PT/OT    Discharge Service: [facility] - HBC: Hospitalist Service #2     Discharge Attending Physician: Barbara SHAUNNA Search, MD     Discharge Diagnoses:  Principal Problem:    Bronchitis (POA: Yes)  Active Problems:    COPD (chronic obstructive pulmonary disease)    (POA: Yes)    Sleep apnea in adult (POA: Yes)    Essential hypertension (POA: Yes)    Incomplete bladder emptying (POA: Yes)    Peripheral neuropathy (POA: Yes)    Chronic respiratory failure with hypoxia    (POA: Yes)    Persistent atrial fibrillation    (POA: Yes)    Chronic kidney disease ([name]), stage IV (severe)    (POA: Yes)    Pulmonary hypertension    (POA: Yes)    Chronic heart failure with preserved ejection fraction    (POA: Yes)    Cardiac amyloidosis    (POA: Yes)  Resolved Problems:    * No resolved hospital problems. *    Outpatient follow-up items:  [ ]  Ongoing attention to diuretic titration  [ ]  Discuss rate control options with cardiology team -- BB or CCB would require particular care in context of cardiac amyloid  [ ]  Repeat TSH, fT4 in about 4 weeks (~07/16/2024)    Hospital course:  Barbara Huber is a 83 y.o. 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 Aside from desaturation after activity in the ED requiring up to 8L [place] to recover, supplemental oxygen  needs were not more than baseline. Admitted for acute exacerbation of chronic bronchitis and improved with antibiotics, airway clearance and supportive care. Now ready for discharge back home with daughter. Details below.    # COPD with acute exacerbation (bronchitis)  # Chronic hypoxemic and hypercarbic respiratory failure  Presenting symptoms as above. Initially was tachypneic (RR 25) and hypoxemic to mid-80%s requiring 8L [place] to recover but weaned to baseline 3L [place] while in the ED. VBG reassuring (7.36 / 44). Labs notable for leukocytosis (WBC up to 23.1, neutrophil-predominant), lactate 4.7, proBNP 15k (increased from 10k one week prior). CXR without focal consolidation, but bronchial wall thickening compatible with acute bronchitis. Respiratory pathogen PCR panel negative including for COVID. Little wheezing and no lower extremity edema on exam. Symptoms were favored to represent infection rather than primarily bronchospasm or volume overload. Treated empirically with piperacillin -tazobactam in the ED, switched to ceftriaxone  and doxycycline  once admitted. Continued ICS / LAMA / LABA plus scheduled albuterol  and Aerobika for airway clearance. Deferred steroids in absence of wheezing, and per her and daughter's strong preference. Work of breathing and constitutional symptoms improved over several days. Switched to cefdinir  and doxycycline  to complete 5 days total treatment, which was finished by time of discharge. Breathing comfortably on 3L [place] at rest at discharge.    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 Had tried extra doses of metolazone  prior to admission, based on dyspnea and rise in proBNP, without improvement and appeared dry on admission. Received just 500 mL IV fluids in the ED but paused diuretics for several days until oral intake improved. Limited echocardiogram obtained to exclude new RV failure and/or new valvular disease as cause for worsening dyspnea; this showed moderate pulmonary hypertension, no new valvular disease and normal RV size and function. Resumed torsemide  and spironolactone  but recommended to withhold metolazone  unless gaining weight or instructed to resume by cardiologist. Weight 80.8 kg (178 lb 1.6 oz) at discharge; recent dry weight thought to be 175-180 lb. Continued tafamidis . Communicated plans with cardiology APP by secure chat prior to discharge.    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 Note that GFR estimated by cystatin-C is < 20 so sCr may overestimate her true renal function.    # Persistent atrial fibrillation  Was in Afib on presentation but did have periods of sinus rhythm during hospital stay. Ventricular rates < 100. Continued amiodarone  and apixaban  (dose-reduced for age and renal function). Given failure of multiple attempts at durable rhythm control, discussed with outpatient cardiology APP the possibility of switching to rate-control strategy with beta-blocker given recent thyroid abnormalities. We note that in the past (see notes from ~2022) she had difficulty tolerating sufficient dose of metoprolol  to control Afib but not precipitate drop in cardiac output. Opted to just continue amiodarone  for now.    # Thyroid dysfunction  Followed by PCP for this issue. Recently stopped methimazole . Did NOT check thyroid studies during acute illness but do not suspect thyroid abnormalities were related to her presenting concern.    # Obstructive sleep apnea  Used home AVAPS device at night without issue.    # Incomplete bladder emptying  Required straight catheterization in the ED. Monitored for retention with bladder scans while inpatient but did not require catheterization again. Resumed prophylactic fosfomycin at discharge.    # Peripheral neuropathy, chronic pain  Continued gabapentin  and oxycodone  without change in doses. PDMP reviewed; appears to be taking these appropriately.      Procedures:  None  ______________________________________________________________________  Discharge Medications:     Your Medication List        PAUSE taking these medications      metOLazone  5 MG tablet  Wait to take this until your doctor or other care provider tells you to start again.  Commonly known as: ZAROXOLYN   Take 1 tablet (5 mg total) by mouth daily as needed (up to 3x weekly when instructed by cardiology clinic).            STOP taking these medications      amoxicillin -clavulanate 500-125 mg per tablet  Commonly known as: AUGMENTIN      azithromycin  250 MG tablet  Commonly known as: ZITHROMAX  Z-PAK            CHANGE how you take these medications      albuterol  90 mcg/actuation inhaler  Commonly known as: PROVENTIL  HFA;VENTOLIN  HFA  Inhale 1 puff every six (6) hours as needed for wheezing.  What changed: Another medication with the same name was removed. Continue taking this medication, and follow the directions you see here.  CONTINUE taking these medications      ACCU-CHEK GUIDE TEST STRIPS Strp  Generic drug: blood sugar diagnostic  Use to check blood sugar daily     ACCU-CHEK SOFTCLIX LANCETS lancets  Generic drug: lancets  CHECK BLOOD SUGAR DAILY     acetaminophen  500 MG tablet  Commonly known as: TYLENOL   Take 2 tablets (1,000 mg total) by mouth Three (3) times a day as needed for pain.     amiodarone  200 MG tablet  Commonly known as: PACERONE   Take 1 tablet (200 mg total) by mouth daily.     AMVUTTRA  25 mg/0.5 mL injection  Generic drug: vutrisiran   Inject 0.5 mL (25 mg total) under the skin once. Every 12 Weeks     apixaban  2.5 mg Tab  Commonly known as: ELIQUIS   Take 1 tablet (2.5 mg total) by mouth two (2) times a day.     benzonatate  200 MG capsule  Commonly known as: TESSALON   Take 1 capsule (200 mg total) by mouth Three (3) times a day as needed for cough for up to 7 days. cranberry 500 mg Cap  Take 500 mg by mouth daily with evening meal.     cyclobenzaprine  10 MG tablet  Commonly known as: FLEXERIL   Take 1 tablet (10 mg total) by mouth two (2) times a day as needed for muscle spasms.     ergocalciferol -1,250 mcg (50,000 unit) 1,250 mcg (50,000 unit) capsule  Commonly known as: DRISDOL   TAKE 1 CAPSULE ONCE A WEEK     fluticasone  propion-salmeterol 115-21 mcg/actuation inhaler  Commonly known as: ADVAIR  HFA  Inhale 2 puffs two (2) times a day.     fosfomycin 3 gram Pack  Commonly known as: MONUROL   Take 3 g by mouth once a week.     gabapentin  100 MG capsule  Commonly known as: NEURONTIN   Take 1 capsule (100 mg total) by mouth two (2) times a day.     LIDOCAINE  PAIN RELIEF  4 % patch  Generic drug: lidocaine   Place 1 patch on the skin daily. Remove after 12 hours.     midodrine  5 MG tablet  Commonly known as: PROAMATINE   Take 1 tablet (5 mg total) by mouth three (3) times a day.     NARCAN 4 mg/actuation nasal spray  Generic drug: naloxone  1 spray into alternating nostrils once as needed (opioid overdose). PRN - Emergency use.     nystatin  100,000 unit/gram powder  Commonly known as: MYCOSTATIN   Apply to affected area 3 times daily     oxyCODONE  5 MG immediate release tablet  Commonly known as: ROXICODONE   Take 1 tablet (5 mg total) by mouth every eight (8) hours as needed for pain.     OXYGEN -AIR DELIVERY SYSTEMS MISC  5 L by Miscellaneous route. Currently using 3  L/min via [place]     pantoprazole  20 MG tablet  Commonly known as: Protonix   TAKE 1 TABLET(20 MG) BY MOUTH DAILY     rosuvastatin  5 MG tablet  Commonly known as: CRESTOR   TAKE 1 TABLET EVERY OTHER DAY     SPIRIVA  RESPIMAT 2.5 mcg/actuation inhalation mist  Generic drug: tiotropium bromide   Inhale 2 puffs daily.     spironolactone  25 MG tablet  Commonly known as: ALDACTONE   Take 0.5 tablets (12.5 mg total) by mouth daily.     torsemide  20 MG tablet  Commonly known as: DEMADEX   Take 4 tablets (80 mg total) by mouth daily. VYNDAMAX  61 mg Cap  Generic  drug: tafamidis   Take 1 capsule (61 mg) by mouth daily.            ASK your doctor about these medications      traZODone  50 MG tablet  Commonly known as: DESYREL   Take 1 tablet (50 mg total) by mouth nightly as needed for sleep.              Allergies:  Nitrofurantoin and Lipitor  [atorvastatin ]  ______________________________________________________________________  Pending Test Results (if blank, then none):  Pending Labs       Order Current Status    Blood Culture Preliminary result    Blood Culture Preliminary result            Most Recent Labs:  Recent Labs     Units 06/11/24  1953 06/12/24  1142 06/13/24  0507 06/15/24  0704 06/16/24  0653   WBC 10*9/L 12.3* 18.9* 23.1* 17.6* 15.7*   RBC 10*12/L 4.00 3.88* 3.42* 3.15* 3.29*   HGB g/dL 87.8 88.3 89.6* 9.7* 89.9*   HCT % 37.3 35.8 31.3* 29.0* 30.4*   PLT 10*9/L 242 229 149* 158 186   LYMPHOPCT % 7.4 4.1  --   --   --    LYMPHSABS 10*9/L 0.9* 0.8*  --   --   --    MONOPCT % 9.4 9.9  --   --   --    MONOSABS 10*9/L 1.2* 1.9*  --   --   --    EOSPCT % 1.5 0.0  --   --   --    EOSABS 10*9/L 0.2 0.0  --   --   --    BASOPCT % 0.6 0.3  --   --   --    BASOSABS 10*9/L 0.1 0.1  --   --   --      Recent Labs     Units 06/11/24  1953 06/12/24  1142 06/13/24  0507 06/14/24  0829 06/15/24  0704 06/16/24  0653   NA mmol/L 134* 136 139 136 137 138   K mmol/L 4.0 4.0 3.4 3.4 3.4 3.8   CL mmol/L 97* 96* 96* 95* 97* 96*   CO2 mmol/L 25.0 24.0 27.3 23.6 25.3 24.7   BUN mg/dL 71* 74* 75* 68* 71* 72*   CREATININE mg/dL 7.39* 7.27* 7.36* 7.49* 2.46* 2.42*   GLU mg/dL 812* 885 78 895 882 897   CALCIUM  mg/dL 9.7 9.6 8.9 9.3 9.2 9.5   ALBUMIN g/dL 3.3* 3.2* 2.5*  --   --   --    PROT g/dL 8.0 7.8 6.3  --   --   --    BILITOT mg/dL 0.5 1.0 0.7  --   --   --    ALKPHOS U/L 132* 127* 100  --   --   --    ALT U/L 8* <7* <7*  --   --   --    AST U/L 26 27 22   --   --   --      Recent Labs     Units 06/14/24  0829 06/15/24  0704 06/16/24  0653   MG mg/dL 2.3 2.3 2.3     Lab Results - Current Encounter   Component Value Date/Time    PROBNP 15,236.0 (H) 06/13/2024 0507         Relevant Studies/Radiology (if blank, then none):  ECG 12 Lead  Result Date: 06/15/2024  SINUS RHYTHM WITH SINUS ARRHYTHMIA WITH 1ST DEGREE AV BLOCK  INTRAVENTRICULAR CONDUCTION DELAY INFERIOR INFARCT , AGE UNDETERMINED POSSIBLE ANTEROLATERAL INFARCT  (CITED ON OR BEFORE 06-Oct-2023) ABNORMAL ECG WHEN COMPARED WITH ECG OF 12-Jun-2024 11:47, PREVIOUS ECG HAS UNDETERMINED RHYTHM, NEEDS REVIEW NONSPECIFIC T WAVE ABNORMALITY, WORSE IN LATERAL LEADS    Echocardiogram W Colorflow Spectral Doppler  Result Date: 06/14/2024  Patient Info Name:     Barbara Huber Age:     82 years DOB:     [birth year] Gender:     Female MRN:     [id number] Accession #:     [id number] UN Account #:     [id number] Ht:     170 cm Wt:     81 kg BSA:     1.98 m2 BP:     114 /     93 mmHg HR:     69 bpm Exam Date:     06/14/2024 2:25 PM Admit Date:     06/12/2024 Exam Type:     ECHOCARDIOGRAM W COLORFLOW SPECTRAL DOPPLER Technical Quality:     Fair Staff Sonographer:     Cletus Daring Referring Physician:     Carliss Riis Pyles Reading Fellow:     Franky LILLETTE Dodson Terry Moral MD Ordering Physician:     Barbara Huber Study Info Indications      - acute on chronic dyspnea,  rising proBNP Procedure(s)   Complete two-dimensional, color flow and Doppler transthoracic echocardiogram is performed. Strain analysis performed. Summary   1. The left ventricle is normal in size with moderately increased wall thickness.   2. The left ventricular systolic function is normal, LVEF is visually estimated at 60-65%.   3. Mitral annular calcification is present (moderate).   4. The right ventricle is normal in size, with normal systolic function.   5. There is moderate pulmonary hypertension.   6. IVC size and inspiratory change suggest mildly elevated right atrial pressure. (5-10 mmHg). Left Ventricle   The left ventricle is normal in size with moderately increased wall thickness. The left ventricular systolic function is normal, LVEF is visually estimated at 60-65%. The left ventricular diastolic function is indeterminate. The LV global longitudinal strain tracking is suboptimal to accurately report the GLS measurement. Right Ventricle   The right ventricle is normal in size, with normal systolic function. Left Atrium   The left atrium is normal in size. Right Atrium   The right atrium is normal in size. Aortic Valve   The aortic valve is trileaflet with mildly thickened leaflets with normal excursion. There is no significant aortic regurgitation. There is no evidence of a significant transvalvular gradient. Mitral Valve   The mitral valve leaflets are mildly thickened with normal leaflet mobility. Mitral annular calcification is present (moderate). There is no significant mitral valve regurgitation. Tricuspid Valve   The tricuspid valve leaflets are normal, with normal leaflet mobility. There is mild tricuspid regurgitation. There is moderate pulmonary hypertension. TR maximum velocity: 3.5 m/s  Estimated PASP: 57 mmHg. Pulmonic Valve   The pulmonic valve is normal. There is no significant pulmonic regurgitation. There is no evidence of a significant transvalvular gradient. Aorta   The aorta is normal in size in the visualized segments. Inferior Vena Cava   IVC size and inspiratory change suggest mildly elevated right atrial pressure. (5-10 mmHg). Pericardium/Pleural   There is no pericardial effusion. Other Findings   Rhythm: Sinus Rhythm. Ventricles ---------------------------------------------------------------------- Name  Value        Normal ---------------------------------------------------------------------- LV Dimensions 2D/MM ----------------------------------------------------------------------  IVS Diastolic Thickness (2D)                                1.4 cm       0.6-0.9 LVID Diastole (2D)                  4.4 cm 3.8-5.2  LVPW Diastolic Thickness (2D)                                1.3 cm       0.6-0.9 LVID Systole (2D)                   2.5 cm       2.2-3.5 LVOT Diameter                       1.8 cm               LV Mass Index (2D Cubed)          117 g/m2         43-95  Relative Wall Thickness (2D)                                  0.59        <=0.42 RV Dimensions 2D/MM ----------------------------------------------------------------------  RV Basal Diastolic Dimension                           3.7 cm       2.5-4.1 TAPSE                               2.1 cm         >=1.7 Atria ---------------------------------------------------------------------- Name                                 Value        Normal ---------------------------------------------------------------------- LA Dimensions ---------------------------------------------------------------------- LA Dimension (2D)                   4.2 cm       2.7-3.8 LA Volume Index (4C A-L)        30.20 ml/m2               LA Volume Index (2C A-L)        51.82 ml/m2               RA Dimensions ---------------------------------------------------------------------- RA Area (4C)                      18.5 cm2        <=18.0 RA Area (4C) Index              9.3 cm2/m2               RA ESV Index (4C MOD)             25 ml/m2         15-27 Left Ventricular Outflow Tract ---------------------------------------------------------------------- Name  Value        Normal ---------------------------------------------------------------------- LVOT 2D ---------------------------------------------------------------------- LVOT Diameter                       1.8 cm               LVOT Area                          2.5 cm2               LVOT Doppler ---------------------------------------------------------------------- LVOT Peak Velocity                 1.3 m/s Aortic Valve ---------------------------------------------------------------------- Name Value        Normal ---------------------------------------------------------------------- AV Doppler ---------------------------------------------------------------------- AV Peak Velocity                   1.6 m/s               AV Peak Gradient                   10 mmHg               AV Area (Cont Eq Vel)              2.0 cm2               AV Area Index (Cont Eq Vel)     1.0 cm2/m2               AV DI (Vel)                           0.79 Mitral Valve ---------------------------------------------------------------------- Name                                 Value        Normal ---------------------------------------------------------------------- MV Doppler ---------------------------------------------------------------------- MV Peak Velocity                   1.5 m/s               MV Mean Gradient                    3 mmHg               MV Diastolic Function ---------------------------------------------------------------------- MV E Peak Velocity                111 cm/s               MV A Peak Velocity                 50 cm/s               MV E/A                                 2.2               MV Annular TDI ---------------------------------------------------------------------- MV Septal e' Velocity             3.5 cm/s         >=8.0 MV E/e' (Septal)  31.9               MV Lateral e' Velocity            5.6 cm/s        >=10.0 MV E/e' (Lateral)                     20.0               MV e' Average                     4.5 cm/s               MV E/e' (Average)                     25.9 Tricuspid Valve ---------------------------------------------------------------------- Name                                 Value        Normal ---------------------------------------------------------------------- TV Regurgitation Doppler ---------------------------------------------------------------------- TR Peak Velocity                   3.5 m/s               Estimated PAP/RSVP ---------------------------------------------------------------------- RA Pressure                         8 mmHg           <=5 RV Systolic Pressure               57 mmHg           <36 Pulmonic Valve ---------------------------------------------------------------------- Name                                 Value        Normal ---------------------------------------------------------------------- PV Doppler ---------------------------------------------------------------------- PV Peak Velocity                   1.1 m/s Aorta ---------------------------------------------------------------------- Name                                 Value        Normal ---------------------------------------------------------------------- Ascending Aorta ---------------------------------------------------------------------- Ao Root Diameter (2D)               3.4 cm               Ao Root Diam Index (2D)          1.7 cm/m2 Venous ---------------------------------------------------------------------- Name                                 Value        Normal ---------------------------------------------------------------------- IVC/SVC ---------------------------------------------------------------------- IVC Diameter (Exp 2D)               2.5 cm         <=2.1 Report Signatures Finalized by Velma Glendia Bevels  MD on 06/14/2024 04:45 PM Resident Juventino JAYSON Deed  MD on 06/14/2024 03:28 PM    ECG 12 Lead  Result Date: 06/13/2024  **POOR DATA QUALITY, INTERPRETATION MAY BE ADVERSELY AFFECTED ATRIAL FIBRILLATION LEFT AXIS DEVIATION INTRAVENTRICULAR CONDUCTION DELAY POSSIBLE LATERAL INFARCT  (  CITED ON OR BEFORE 06-Oct-2023) ABNORMAL ECG WHEN COMPARED WITH ECG OF 11-Jun-2024 21:44, ATRIAL FIBRILLATION HAS REPLACED ACCELERATED JUNCTIONAL RHYTHM NONSPECIFIC T WAVE ABNORMALITY NOW EVIDENT IN ANTEROLATERAL LEADS Confirmed by Leni Mings ([id number]) on 06/13/2024 12:18:48 AM    XR Chest 2 views  Result Date: 06/12/2024  EXAM: XR CHEST 2 VIEWS DATE: 06/12/2024 12:51 PM ACCESSION: [id number] UN DICTATED: 06/12/2024 1:11 PM CLINICAL INDICATION: 83 years old Female with COUGH  TECHNIQUE: PA and Lateral Chest Radiographs COMPARISON: Multiple, the most recent from 1 day prior FINDINGS: LIMITATIONS: Position of arms on sagittal view. HARDWARE:  No indwelling support hardware is evident. HEART &  MEDIASTINUM:  The cardiac silhouette is borderline prominent. PULMONARY/PLEURAL SPACE: Low lung volumes with associated bibasilar atelectasis. Diffuse interstitial prominence appears to predominately relate to bronchial thickening. Otherwise, the lungs are well aerated without overt consolidation consolidation. Small if any, pleural effusions. No pneumothorax..   OTHER:  Unremarkable imaging of the cephalad abdomen     Similar radiographic imaging of the chest relative to the comparison exam from 1 day prior. Notably: 1.  Diffuse interstitial prominence appears to predominantly reflect bronchial thickening, which is likely associated with infectious/inflammatory airway disease. However, some element of edema is not excluded. 2.  The lateral chest radiograph is limited. However, there is minimal, if any, pleural fluid.    ______________________________________________________________________  Discharge Instructions:         Follow Up instructions and Outpatient Referrals     Ambulatory Referral to Home Health      Reason for referral: therapy    Is this a [facility] or [facility] Patient?: Yes    Home Health Options: Traditional Home Health    Is this patient at high risk for COVID 19 transmission and recommended to   stay at home during this pandemic?: No    If the patient has a diagnosis of heart failure and is already on oral   diuretics, do you want to activate the in home IV Lasix  protocol?: No    Do you want agency provider parameter notifications or patient specific   provider parameter notifications?: Agency    Do you want to initiate remote patient monitoring?: No    Physician to follow patient's care: PCP    Disciplines requested:  Physical Therapy  Occupational Therapy       Physical Therapy requested:  Home safety evaluation  Evaluate and treat       Occupational Therapy Requested:  Evaluate and treat  Home safety evaluation       Requested SOC Date:  Comment - within 48H    Call MD for:  difficulty breathing, headache or visual disturbances      Call MD for:  persistent dizziness or light-headedness      Call MD for:  persistent nausea or vomiting      Call MD for:  severe uncontrolled pain      Call MD for: Temperature > 38.5 Celsius ( > 101.3 Fahrenheit)      Discharge instructions          Other Instructions       Call MD for:  difficulty breathing, headache or visual disturbances      Call MD for:  persistent dizziness or light-headedness      Call MD for:  persistent nausea or vomiting      Call MD for:  severe uncontrolled pain      Call MD for: Temperature > 38.5 Celsius ( > 101.3 Fahrenheit)  Discharge instructions      You were admitted to [facility] for respiratory infection (bronchitis or pneumonia). We treated you with IV fluids, antibiotics, nebulizers to help break up mucus, and a break from diuretics. Symptoms improved over next several days and you are ready to go home. It will probably take at least a couple of weeks until you are fully back to your normal.    KEY MEDICATION CHANGES  * START albuterol , 2 puffs every 6 hours while awake for next 2 days, then as needed after that  * CONTINUE airway clearance with Aerobika device, ideally right after using albuterol   * CONTINUE Advair  and Spiriva  inhalers  * CONTINUE torsemide  (Demadex ) and spironolactone  (Aldactone )  * PAUSE metolazone  until cardiologist or pulmonologist tell you to resume    HEART FAILURE CARE  * Please weigh yourself regularly, ideally every morning  * Contact your cardiologist if you gain >= 5 lb in one week or notice significant increase in swelling    Discharge instructions to patient: Call your primary care doctor and make an appointment to see them:      Within 2 weeks from the time you are discharged from the hospital            Appointments which have been scheduled for you      Jun 21, 2024 10:45 AM  (Arrive by 10:30 AM)  RETURN CONTINUITY with Velia Corena Novak, MD  [facility] INTERNAL MEDICINE [place]  [place] [facility] REGION) [address]  [facility] 1 through 4  [place] [address]  [phone]        Aug 02, 2024 1:30 PM  (Arrive by 1:15 PM)  RETURN HEART FAILURE with Damien Brodie Lee, AGNP  [facility] CARDIOLOGY [place] [facility] REGION) [address]  [facility] 1 through 4  [place] [address]  [phone]        Aug 02, 2024 2:30 PM  (Arrive by 2:15 PM)  INFUSION ONLY with UNCTIF 08  [facility] THERAPEUTIC INFUSION CTR [place] [facility] REGION) [address]  [facility] 1 through 4  [place] [address]  [phone]        Sep 11, 2024 1:40 PM  (Arrive by 1:25 PM)  RETURN CONTINUITY with Velia Corena Novak, MD  [facility] INTERNAL MEDICINE [place] [facility] REGION) [address]  [facility] 1 through 4  [place] [address]  [phone]        Sep 14, 2024 11:00 AM  (Arrive by 10:45 AM)  PFT with PFT 4  [facility] PULMONARY SPECIALTY FUNCT [place]) [address]  [place] 1 through 4  [place] [address]  [phone]   If you have inhaled breathing medications, please do not use it 4 hours prior to this breathing test.  Please wear comfortable clothing and well-fitting , closed toe shoes in the even that a 6-minute walk is part of your test.  If you have fallen in the past month please inform your respiratory therapist at the start of your appointment.    While you may bring a guest to your appointment, they will not be able to be present in the testing area.         Sep 14, 2024 12:00 PM  (Arrive by 11:45 AM)  RETURN PULMONARY HYPERTENSION with Heron Jama Hitt, MD  [facility] PULMONARY SPECIALTY CL [place] ([place])  [address]  [facility] 1 through 4  [place] [address]  [phone]             ______________________________________________________________________  Discharge Day Services:  BP 126/64  - Pulse 71  - Temp 36.6 ??C (97.9 ??F) (Oral)  - Resp 18  - Ht 170.2 cm (5' 7)  - Wt 80.8 kg (178 lb 1.6 oz)  - LMP  (LMP Unknown)  - SpO2 96%  - BMI 27.89 kg/m??     Pt seen on the day of discharge and determined appropriate for discharge.    Condition at Discharge: stable    Length of Discharge: I spent greater than 30 mins in the discharge of this patient.     ------------  Barbara SHAUNNA Search, MD PhD  [facility] Medicine

## 2024-06-17 DIAGNOSIS — E0590 Thyrotoxicosis, unspecified without thyrotoxic crisis or storm: Principal | ICD-10-CM

## 2024-06-17 MED ORDER — METHIMAZOLE 5 MG TABLET
ORAL_TABLET | Freq: Every day | ORAL | 0 refills | 0.00000 days
Start: 2024-06-17 — End: ?

## 2024-06-18 ENCOUNTER — Inpatient Hospital Stay: Admit: 2024-06-18 | Payer: Medicare (Managed Care)

## 2024-06-18 ENCOUNTER — Encounter: Admit: 2024-06-18 | Payer: Medicare (Managed Care)

## 2024-06-18 ENCOUNTER — Encounter: Admit: 2024-06-18 | Discharge: 2024-07-17 | Payer: Medicare (Managed Care)

## 2024-06-18 ENCOUNTER — Encounter: Admit: 2024-06-18 | Payer: MEDICARE

## 2024-06-19 DIAGNOSIS — Z09 Encounter for follow-up examination after completed treatment for conditions other than malignant neoplasm: Principal | ICD-10-CM

## 2024-06-19 DIAGNOSIS — J9611 Chronic respiratory failure with hypoxia: Principal | ICD-10-CM

## 2024-06-19 DIAGNOSIS — J42 Unspecified chronic bronchitis: Principal | ICD-10-CM

## 2024-06-19 NOTE — Unmapped (Signed)
 Called pt's daughter Barbara Huber in response to [employer]. Per [name], her O2 was increased at nighttime due to drop in O2 sats overnight. They have been using 5L at home at night, via her concentrator hooked up to the [employer] machine.  Her O2 sat is not maintaining >90% when she sleeps despite 5L, staying around 88-90%.  Her current O2 concentrator only goes to 5L and so they are wondering if they can get a new concentrator that goes higher. Message sent to pulm clinic for assistance. Otherwise [name] feels her volume is at baseline, no orthopnea/PND, weight around 170lbs.

## 2024-06-19 NOTE — Unmapped (Signed)
 RPM declined by patient takes own VS

## 2024-06-20 MED ORDER — METHIMAZOLE 5 MG TABLET
ORAL_TABLET | Freq: Every day | ORAL | 0 refills | 30.00000 days
Start: 2024-06-20 — End: 2025-06-20

## 2024-06-21 ENCOUNTER — Encounter: Admit: 2024-06-21 | Discharge: 2024-06-21 | Payer: Medicare (Managed Care)

## 2024-06-21 ENCOUNTER — Inpatient Hospital Stay: Admit: 2024-06-21 | Discharge: 2024-06-21 | Payer: Medicare (Managed Care)

## 2024-06-21 DIAGNOSIS — E0590 Thyrotoxicosis, unspecified without thyrotoxic crisis or storm: Principal | ICD-10-CM

## 2024-06-21 DIAGNOSIS — J189 Pneumonia, unspecified organism: Principal | ICD-10-CM

## 2024-06-21 DIAGNOSIS — D649 Anemia, unspecified: Principal | ICD-10-CM

## 2024-06-21 DIAGNOSIS — E854 Organ-limited amyloidosis: Principal | ICD-10-CM

## 2024-06-21 DIAGNOSIS — R059 Cough, unspecified type: Principal | ICD-10-CM

## 2024-06-21 DIAGNOSIS — R0600 Dyspnea, unspecified: Principal | ICD-10-CM

## 2024-06-21 DIAGNOSIS — I5032 Chronic diastolic (congestive) heart failure: Principal | ICD-10-CM

## 2024-06-21 DIAGNOSIS — I43 Cardiomyopathy in diseases classified elsewhere: Principal | ICD-10-CM

## 2024-06-21 LAB — CBC W/ AUTO DIFF
BASOPHILS ABSOLUTE COUNT: 0.1 10*9/L (ref 0.0–0.1)
BASOPHILS RELATIVE PERCENT: 0.7 %
EOSINOPHILS ABSOLUTE COUNT: 0.2 10*9/L (ref 0.0–0.5)
EOSINOPHILS RELATIVE PERCENT: 1.6 %
HEMATOCRIT: 28.1 % — ABNORMAL LOW (ref 34.0–44.0)
HEMOGLOBIN: 9.2 g/dL — ABNORMAL LOW (ref 11.3–14.9)
LYMPHOCYTES ABSOLUTE COUNT: 0.8 10*9/L — ABNORMAL LOW (ref 1.1–3.6)
LYMPHOCYTES RELATIVE PERCENT: 7.7 %
MEAN CORPUSCULAR HEMOGLOBIN CONC: 32.7 g/dL (ref 32.0–36.0)
MEAN CORPUSCULAR HEMOGLOBIN: 30.1 pg (ref 25.9–32.4)
MEAN CORPUSCULAR VOLUME: 92.2 fL (ref 77.6–95.7)
MEAN PLATELET VOLUME: 8.2 fL (ref 6.8–10.7)
MONOCYTES ABSOLUTE COUNT: 0.9 10*9/L — ABNORMAL HIGH (ref 0.3–0.8)
MONOCYTES RELATIVE PERCENT: 8.8 %
NEUTROPHILS ABSOLUTE COUNT: 8.2 10*9/L — ABNORMAL HIGH (ref 1.8–7.8)
NEUTROPHILS RELATIVE PERCENT: 81.2 %
PLATELET COUNT: 251 10*9/L (ref 150–450)
RED BLOOD CELL COUNT: 3.05 10*12/L — ABNORMAL LOW (ref 3.95–5.13)
RED CELL DISTRIBUTION WIDTH: 14.9 % (ref 12.2–15.2)
WBC ADJUSTED: 10.1 10*9/L (ref 3.6–11.2)

## 2024-06-21 LAB — BASIC METABOLIC PANEL
ANION GAP: 9 mmol/L (ref 5–14)
BLOOD UREA NITROGEN: 86 mg/dL — ABNORMAL HIGH (ref 9–23)
BUN / CREAT RATIO: 40
CALCIUM: 9.2 mg/dL (ref 8.7–10.4)
CHLORIDE: 99 mmol/L (ref 98–107)
CO2: 27.1 mmol/L (ref 20.0–31.0)
CREATININE: 2.13 mg/dL — ABNORMAL HIGH (ref 0.55–1.02)
EGFR CKD-EPI (2021) FEMALE: 23 mL/min/{1.73_m2} — ABNORMAL LOW (ref >=60)
GLUCOSE RANDOM: 157 mg/dL (ref 70–179)
POTASSIUM: 3.8 mmol/L (ref 3.4–4.8)
SODIUM: 135 mmol/L (ref 135–145)

## 2024-06-21 LAB — PRO-BNP: PRO-BNP: 9703 pg/mL — ABNORMAL HIGH (ref <=300.0)

## 2024-06-21 LAB — FERRITIN: FERRITIN: 205.4 ng/mL (ref 7.3–270.7)

## 2024-06-21 MED ORDER — DOXYCYCLINE HYCLATE 100 MG CAPSULE
ORAL_CAPSULE | Freq: Two times a day (BID) | ORAL | 0 refills | 5.00000 days | Status: CP
Start: 2024-06-21 — End: 2024-06-26

## 2024-06-21 NOTE — Unmapped (Signed)
 Internal Medicine Hospital Follow-Up Visit    ASSESSMENT / PLAN:  Things to follow-up at next visit:   Weight  Oxygen   Assessment & Plan  Pneumonia  Persistent symptoms post-antibiotic course suggest need for extended therapy. Oxygen  desaturation during sleep noted. Sputum test negative for viruses. Chest x-ray shows diffuse interstital prominence. RPP was neg  - Prescribe additional 5 days of doxycycline .  - Increase Mucinex to 600 mg TID.  - Consider Tessalon  or Robitussin DM at night.  Repeat CBC  Addendum:wbc improved, but still elevated compared to baseline and neutrophilic predominance.       Dyspnea  Low sats at night -  Likely combination of infection and CHF, with baseline COPD  Noted she is on apixaban  2.5 mg bid  Plan; extend pneumonia treatment as above  Cont trelegy inhaler  Recheck proBNP-(trending down)  - Order EKG for atrial fibrillation assessment.-in sinus rhythm  - Check CBC and pro BNP.  Addendum: slight drop in Hb, will check ferritin, iron  studies  - Refer to pulmonology for walk test., for device with increased oxygen  capacity    Chronic Obstructive Pulmonary Disease (COPD)  On spiriva , advair  and alb nebs. Reports no change with nebs  Plan: cont current management.    Congestive Heart Failure  Has [name], from amyloid  Weight down, to 171, but is also not eating  - Check pro BNP.  - Consider increasing diuretics if pro BNP elevated.Currently taking 80 mg every day of torsemide     Poor Appetite  Reports poor appetite without nausea, requires liquids for swallowing.  Plan: CTM, consider ensure ifpersists    Follow-up  Monitor progress and adjust treatment. Home health providing physical therapy.  - Schedule video visit in two weeks.  - Perform EKG and labs during current visit.  - Proceed to pulmonology for walk test after current visit.    1. Cough, unspecified type    2. Dyspnea, unspecified type    3. Hyperthyroidism    4. Cardiac amyloidosis       5. Pneumonia due to infectious organism, unspecified laterality, unspecified part of lung    6. Chronic heart failure with preserved ejection fraction       7. Anemia, unspecified type              Medication adjustments:   Extended her doxycycline     Patient provided with an updated and reconciled medications list.    Follow Up: Return in about 2 weeks (around 07/05/2024).    Future Appointments   Date Time Provider Department Center   06/22/2024 To Be Determined Barbara Huber [facility] [place]   06/23/2024 To Be Determined Barbara Huber [facility] [place]   06/26/2024 To Be Determined Barbara Huber [facility] [place]   06/28/2024 To Be Determined Barbara Huber [facility] [place]   06/30/2024 To Be Determined Barbara Huber [facility] [place]   07/10/2024  4:10 PM Barbara Velia Canales, MD [facility] [place]   08/02/2024  1:30 PM Barbara Huber [facility] [place]   08/02/2024  2:30 PM UNCTIF 08 UNCTHERINFET [place]   09/11/2024  1:40 PM Barbara Velia Canales, MD [facility] [place]   09/14/2024 11:00 AM PFT 4 [facility]ULSPFU[facility] [place]   09/14/2024 12:00 PM Barbara Heron Ruth, MD UNCPULSPCLET [place]       Medication adherence and barriers to the treatment plan have been addressed. Opportunities to optimize  healthy behaviors have been discussed. Patient / caregiver voiced understanding.        CHIEF COMPLAINT: Hospital Follow-Up for Hospitalization Follow-up  , for shortness of breath and pneumonia    Date of Hospitalization Discharge:  06/16/24     Reviewed: Discharge summary, Labs, and Imaging (e.g. xrays, CT, etc); See results in Epic  Discussed with cardiology team  Interactive Contact by phone or other method (Encounter type: Patient Outreach):  Patient Outreach History (Since 06/07/2024)       Chronic obstructive pulmonary disease       Date Method of Outreach Associated Actions User Next Outreach    06/19/2024 12:19 PM Telephone  Barbara Armida CROME, RN               Transition of Care       Date Method of Outreach Associated Actions User Next Outreach    06/19/2024 12:19 PM Telephone  Barbara Armida CROME, RN                    Successful contact made or 2 unsuccessful attempts within 2 business days  History obtained from: Family member and Patient    HISTORY OF PRESENT ILLNESS:  Summary of hospitalization:   Barbara Huber presented to [facility] ED on 06/12/2024 with several days of increased cough with thick sputum, increased dyspnea with light activity, fatigue and subjective fevers. Aside from desaturation after activity in the ED requiring up to 8L [place] to recover, supplemental oxygen  needs were not more than baseline. Admitted for acute exacerbation of chronic bronchitis and improved with antibiotics, airway clearance and supportive care. Now ready for discharge back home with daughter. Details below.     # COPD with acute exacerbation (bronchitis)  # Chronic hypoxemic and hypercarbic respiratory failure  Presenting symptoms as above. Initially was tachypneic (RR 25) and hypoxemic to mid-80%s requiring 8L [place] to recover but weaned to baseline 3L [place] while in the ED. VBG reassuring (7.36 / 44). Labs notable for leukocytosis (WBC up to 23.1, neutrophil-predominant), lactate 4.7, proBNP 15k (increased from 10k one week prior). CXR without focal consolidation, but bronchial wall thickening compatible with acute bronchitis. Respiratory pathogen PCR panel negative including for COVID. Little wheezing and no lower extremity edema on exam. Symptoms were favored to represent infection rather than primarily bronchospasm or volume overload. Treated empirically with piperacillin -tazobactam in the ED, switched to ceftriaxone  and doxycycline  once admitted. Continued ICS / LAMA / LABA plus scheduled albuterol  and Aerobika for airway clearance. Deferred steroids in absence of wheezing, and per her and daughter's strong preference. Work of breathing and constitutional symptoms improved over several days. Switched to cefdinir  and doxycycline  to complete 5 days total treatment, which was finished by time of discharge. Breathing comfortably on 3L [place] at rest at discharge.     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 Had tried extra doses of metolazone  prior to admission, based on dyspnea and rise in proBNP, without improvement and appeared dry on admission. Received just 500 mL IV fluids in the ED but paused diuretics for several days until oral intake improved. Limited echocardiogram obtained to exclude new RV failure and/or new valvular disease as cause for worsening dyspnea; this showed moderate pulmonary hypertension, no new valvular disease and normal RV size and function. Resumed torsemide  and spironolactone  but recommended to withhold metolazone  unless gaining weight or instructed to resume  by cardiologist. Weight 80.8 kg (178 lb 1.6 oz) at discharge; recent dry weight thought to be 175-180 lb. Continued tafamidis . Communicated plans with cardiology APP by secure chat prior to discharge.     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 Note that GFR estimated by cystatin-C is < 20 so sCr may overestimate her true renal function.       Interval update:    History of Present Illness  Barbara Huber is an 83 year old female who presents with ongoing respiratory issues and poor appetite.    She was recently hospitalized for pneumonia and discharged on Friday due to breathing difficulties, suspected to be caused by an infection. During her hospital stay, her white blood cell count was elevated, and she was treated with antibiotics including Piptazo, ceftriaxone , and cefdinir . She completed a course of cefdinir  and doxycycline . Since discharge, she has been experiencing low oxygen  saturation levels, particularly at night, dropping to the mid-eighties, and requiring an increase in her oxygen  flow from three to five liters to maintain saturation around 90-91%.    She has a persistent cough that is not productive, although it has improved since her hospitalization. She has been using Mucinex 600 mg twice daily and has tried both the pill and liquid forms. She also uses inhalers including Spiriva , Advair , and albuterol , though she feels albuterol  is not particularly helpful. No fevers or chills have been experienced recently. A sputum test during her hospital stay did not reveal any viral infections.    Her appetite is poor, and she needs to 'wash her food down' due to lack of appetite, though she does not feel nauseated. She is not very active and primarily uses a walker at home. Home health provides physical therapy support.    She continues to take torsemide  80 mg daily and spironolactone . She has stopped taking metamizole for her thyroid. She uses a walker at home and is not currently taking metolazone , only using it as directed by her healthcare provider. She urinates without issues.    She has a history of adverse reactions to prednisone, which previously affected her kidneys and blood sugar, leading to hospitalization. She is currently not on any prednisone therapy.      MEDICATIONS AND ALLERGIES:   Reviewed and updated in EPIC      SOCIAL/FAMILY HISTORY:  Social History:  Reviewed in Epic      REVIEW OF SYSTEMS:    All other systems reviewed are negative except as noted here or in HPI.    PHYSICAL EXAM  Vitals:    Vitals:    06/21/24 1110   BP: 106/49   Pulse: 65   Resp: 16   Temp: 36.8 ??C (98.2 ??F)   SpO2: 99%       Exam:  Sitting in wheelchair, on oxygen   Lungs: l>r crackles  Heart: regular, 1+ pedal edema, no JVD    Labs:  Reviewed from discharge.  See Epic labs.  Radiology: Reviewed radiology studies from discharge. See in Epic.     Lab Results   Component Value Date    TSH 16.943 (H) 06/08/2024       MEDICAL/SURGICAL HISTORY:  Problem List[1]          [1]   Patient Active Problem List  Diagnosis    Spinal stenosis of lumbar region    Thoracic or lumbosacral neuritis or radiculitis  Lumbosacral spondylosis    COPD (chronic obstructive pulmonary disease)       Sleep apnea in adult    Essential hypertension    Type 2 diabetes mellitus with stage 4 chronic kidney disease, with long-term current use of insulin        Chronic cystitis    Incomplete bladder emptying    Peripheral neuropathy    Chronic respiratory failure with hypoxia       Persistent atrial fibrillation       Anemia in chronic kidney disease    Chronic kidney disease ([name]), stage IV (severe)       Pulmonary hypertension       Chronic prescription opiate use    Bronchitis    Chronic heart failure with preserved ejection fraction       Cardiac amyloidosis       Recurrent cold sores    Dyspepsia    Iron  deficiency anemia due to chronic blood loss    Neuropathy    Left hip pain    Familial amyloid heart disease       Hereditary amyloidosis       Osteoporosis with current pathological fracture    Troponin level elevated    Thrombocytopenia    Difficulty walking    Pneumonia due to infectious organism    Dyspnea

## 2024-06-21 NOTE — Unmapped (Signed)
 [name]     Reason for visit: Follow up    Questions / Concerns that need to be addressed: Hospital F/U    Screening BP-   106/49      PTHomeBP     Diabetes:  Regularly checking blood sugars?: yes  If yes, when? Complete log for past 7 days  Date Before Breakfast After Breakfast Before Lunch After Lunch Before Dinner After Dinner Before Bed    06/21/2024  136                                                                                                                               Dexcom or Libre CGM in use? If so, pull appropriate reporting through portal (Dexcom) or EPIC order [place]).    HCDM reviewed and updated in Epic:    We are working to make sure all of our patients??? wishes are updated in Epic and part of that is documenting a Environmental health practitioner for each patient  A Health Care Decision Maker is someone you choose who can make health care decisions for you if you are not able - who would you most want to do this for you????  is already up to date.    HCDM (With Legal Document To Support): Barbara Huber - Daughter - [phone]    BPAs completed:      Annual Screenings:     __________________________________________________________________________________________    SCREENINGS COMPLETED IN FLOWSHEETS      AUDIT       PHQ2       PHQ9          GAD7       COPD Assessment       Falls Risk

## 2024-06-22 DIAGNOSIS — J40 Bronchitis, not specified as acute or chronic: Principal | ICD-10-CM

## 2024-06-22 DIAGNOSIS — I5032 Chronic diastolic (congestive) heart failure: Principal | ICD-10-CM

## 2024-06-22 DIAGNOSIS — J42 Unspecified chronic bronchitis: Principal | ICD-10-CM

## 2024-06-22 DIAGNOSIS — J189 Pneumonia, unspecified organism: Principal | ICD-10-CM

## 2024-06-22 DIAGNOSIS — S31809S Unspecified open wound of unspecified buttock, sequela: Principal | ICD-10-CM

## 2024-06-22 MED ORDER — AEROCHAMBER MV SPACER
0 refills | 0.00000 days | Status: CP
Start: 2024-06-22 — End: ?

## 2024-06-22 MED ORDER — ADVAIR HFA 115 MCG-21 MCG/ACTUATION AEROSOL INHALER
RESPIRATORY_TRACT | 5 refills | 0.00000 days | Status: CP
Start: 2024-06-22 — End: ?

## 2024-06-22 MED ORDER — FEROSUL 325 MG (65 MG IRON) TABLET
ORAL_TABLET | 0 refills | 0.00000 days
Start: 2024-06-22 — End: ?

## 2024-06-22 NOTE — Unmapped (Signed)
 Copied from CRM #[id number]. Topic: Access To Clinicians - Medication Refill  >> Jun 22, 2024 11:42 AM Evan C wrote:    [facility] has received an incoming clinical call:    Caller name: Daughter    Best callback number: [phone]   Relationship to Patient:   Describe the reason for the call: Patient daughter would like to know if they can stop by  the clinic to pick a device that was forgotten to be given to on yesterday.

## 2024-06-22 NOTE — Unmapped (Signed)
 Refill request received for patient.      Medication Requested: Advair  HFA 115-21 mcg inhaler  Last Office Visit: 04/26/2024   Next Office Visit: 08/02/2024  Last Prescriber: Joesph Prentice SQUIBB, MD [facility] Hospitalist) on 06/16/2024    Nurse refill requirements met? No  If not met, why: medication not managed by our provider(s)    Sent to: Provider for signing  If sent to provider, which provider?: [name], pt's PCP

## 2024-06-22 NOTE — Unmapped (Signed)
 Called daughter back. Daughter is requesting a prescription for a spacer for inhaler to be called into pharmacy. Also wanted to check on status of FMLA paperwork. This RN refaxed. Also wanted to notify PCP that she noticed mom has small sore on buttocks after they left their appointment. Asking for recommendations.

## 2024-06-26 MED ORDER — FEROSUL 325 MG (65 MG IRON) TABLET
ORAL_TABLET | ORAL | 0 refills | 0.00000 days | Status: CP
Start: 2024-06-26 — End: ?

## 2024-06-26 NOTE — Unmapped (Signed)
 [facility] Specialty and Home Delivery Pharmacy Refill Coordination Note    Specialty Medication(s) to be Shipped:   Cardiology: Vyndamax     Other medication(s) to be shipped: No additional medications requested for fill at this time     Barbara Huber, DOB: November 04, 1941  Phone: [phone] (home)       All above HIPAA information was verified with patient's family member, Barbara Huber.     Was a Nurse, learning disability used for this call? No    Completed refill call assessment today to schedule patient's medication shipment from the [facility]  [phone]).  All relevant notes have been reviewed.     Specialty medication(s) and dose(s) confirmed: Regimen is correct and unchanged.   Changes to medications: Kelcey reports no changes at this time.  Changes to insurance: No  New side effects reported not previously addressed with a pharmacist or physician: None reported  Questions for the pharmacist: No    Confirmed patient received a Conservation officer, historic buildings and a Surveyor, mining with first shipment. The patient will receive a drug information handout for each medication shipped and additional FDA Medication Guides as required.       DISEASE/MEDICATION-SPECIFIC INFORMATION        N/A    SPECIALTY MEDICATION ADHERENCE     Medication Adherence    Patient reported X missed doses in the last month: 0  Specialty Medication: VYNDAMAX  61 mg Cap (tafamidis )  Patient is on additional specialty medications: No  Patient is on more than two specialty medications: No  Any gaps in refill history greater than 2 weeks in the last 3 months: no  Demonstrates understanding of importance of adherence: yes              Were doses missed due to medication being on hold? No    VYNDAMAX  61  mg: 7 days of medicine on hand     REFERRAL TO PHARMACIST     Referral to the pharmacist: Not needed      [name]     Shipping address confirmed in Epic.     Cost and Payment: Patient has a $0 copay, payment information is not required.    Delivery Scheduled: Yes, Expected medication delivery date: 06/29/24.     Medication will be delivered via Same Day Courier to the prescription address in Epic WAM.    Kyra Myron   [facility] Delivery [facility]  Specialty Technician

## 2024-06-29 MED FILL — VYNDAMAX 61 MG CAPSULE: ORAL | 30 days supply | Qty: 30 | Fill #6

## 2024-07-03 ENCOUNTER — Inpatient Hospital Stay: Admit: 2024-07-03 | Discharge: 2024-07-04 | Payer: Medicare (Managed Care)

## 2024-07-03 ENCOUNTER — Encounter: Admit: 2024-07-03 | Discharge: 2024-07-04 | Payer: Medicare (Managed Care)

## 2024-07-03 ENCOUNTER — Ambulatory Visit: Admit: 2024-07-03 | Discharge: 2024-07-04 | Payer: Medicare (Managed Care)

## 2024-07-03 DIAGNOSIS — G4733 Obstructive sleep apnea (adult) (pediatric): Principal | ICD-10-CM

## 2024-07-03 DIAGNOSIS — J441 Chronic obstructive pulmonary disease with (acute) exacerbation: Principal | ICD-10-CM

## 2024-07-03 DIAGNOSIS — E1122 Type 2 diabetes mellitus with diabetic chronic kidney disease: Principal | ICD-10-CM

## 2024-07-03 DIAGNOSIS — Z7951 Long term (current) use of inhaled steroids: Principal | ICD-10-CM

## 2024-07-03 DIAGNOSIS — E854 Organ-limited amyloidosis: Principal | ICD-10-CM

## 2024-07-03 DIAGNOSIS — E079 Disorder of thyroid, unspecified: Principal | ICD-10-CM

## 2024-07-03 DIAGNOSIS — L989 Disorder of the skin and subcutaneous tissue, unspecified: Principal | ICD-10-CM

## 2024-07-03 DIAGNOSIS — Z9981 Dependence on supplemental oxygen: Principal | ICD-10-CM

## 2024-07-03 DIAGNOSIS — N184 Chronic kidney disease, stage 4 (severe): Principal | ICD-10-CM

## 2024-07-03 DIAGNOSIS — G629 Polyneuropathy, unspecified: Principal | ICD-10-CM

## 2024-07-03 DIAGNOSIS — D631 Anemia in chronic kidney disease: Principal | ICD-10-CM

## 2024-07-03 DIAGNOSIS — J440 Chronic obstructive pulmonary disease with acute lower respiratory infection: Principal | ICD-10-CM

## 2024-07-03 DIAGNOSIS — R339 Retention of urine, unspecified: Principal | ICD-10-CM

## 2024-07-03 DIAGNOSIS — G8929 Other chronic pain: Principal | ICD-10-CM

## 2024-07-03 DIAGNOSIS — I4819 Other persistent atrial fibrillation: Principal | ICD-10-CM

## 2024-07-03 DIAGNOSIS — I5032 Chronic diastolic (congestive) heart failure: Principal | ICD-10-CM

## 2024-07-03 DIAGNOSIS — I110 Hypertensive heart disease with heart failure: Principal | ICD-10-CM

## 2024-07-03 DIAGNOSIS — Z7901 Long term (current) use of anticoagulants: Principal | ICD-10-CM

## 2024-07-03 DIAGNOSIS — J9611 Chronic respiratory failure with hypoxia: Principal | ICD-10-CM

## 2024-07-03 DIAGNOSIS — I272 Pulmonary hypertension, unspecified: Principal | ICD-10-CM

## 2024-07-03 DIAGNOSIS — J9612 Chronic respiratory failure with hypercapnia: Principal | ICD-10-CM

## 2024-07-03 DIAGNOSIS — Z556 Problems related to health literacy: Principal | ICD-10-CM

## 2024-07-03 DIAGNOSIS — J189 Pneumonia, unspecified organism: Principal | ICD-10-CM

## 2024-07-03 LAB — COMPREHENSIVE METABOLIC PANEL
ALBUMIN: 2.9 g/dL — ABNORMAL LOW (ref 3.4–5.0)
ALKALINE PHOSPHATASE: 129 U/L — ABNORMAL HIGH (ref 46–116)
ALT (SGPT): 9 U/L — ABNORMAL LOW (ref 10–49)
ANION GAP: 15 mmol/L — ABNORMAL HIGH (ref 5–14)
AST (SGOT): 32 U/L (ref <=34)
BILIRUBIN TOTAL: 0.7 mg/dL (ref 0.3–1.2)
BLOOD UREA NITROGEN: 89 mg/dL — ABNORMAL HIGH (ref 9–23)
BUN / CREAT RATIO: 31
CALCIUM: 9.2 mg/dL (ref 8.7–10.4)
CHLORIDE: 92 mmol/L — ABNORMAL LOW (ref 98–107)
CO2: 28.9 mmol/L (ref 20.0–31.0)
CREATININE: 2.86 mg/dL — ABNORMAL HIGH (ref 0.55–1.02)
EGFR CKD-EPI (2021) FEMALE: 16 mL/min/1.73m2 — ABNORMAL LOW (ref >=60)
GLUCOSE RANDOM: 101 mg/dL (ref 70–179)
POTASSIUM: 3.9 mmol/L (ref 3.4–4.8)
PROTEIN TOTAL: 6.7 g/dL (ref 5.7–8.2)
SODIUM: 136 mmol/L (ref 135–145)

## 2024-07-03 LAB — BLOOD GAS CRITICAL CARE PANEL, VENOUS
BASE EXCESS VENOUS: 7.5 — ABNORMAL HIGH (ref -2.0–2.0)
CALCIUM IONIZED VENOUS (MG/DL): 4.54 mg/dL (ref 4.40–5.40)
GLUCOSE WHOLE BLOOD: 97 mg/dL (ref 54–400)
HCO3 VENOUS: 30 mmol/L — ABNORMAL HIGH (ref 22–27)
HEMOGLOBIN BLOOD GAS: 10.3 g/dL — ABNORMAL LOW (ref 12.00–16.00)
LACTATE BLOOD VENOUS: 2 mmol/L — ABNORMAL HIGH (ref 0.5–1.8)
O2 SATURATION VENOUS: 67 % (ref 40.0–85.0)
PCO2 VENOUS: 53 mmHg (ref 40–60)
PH VENOUS: 7.4 (ref 7.32–7.43)
PO2 VENOUS: 38 mmHg (ref 35–40)
POTASSIUM WHOLE BLOOD: 3.6 mmol/L (ref 3.4–4.6)
SODIUM WHOLE BLOOD: 135 mmol/L (ref 135–145)

## 2024-07-03 LAB — CBC W/ AUTO DIFF
BASOPHILS ABSOLUTE COUNT: 0 10*9/L (ref 0.0–0.1)
BASOPHILS RELATIVE PERCENT: 0.5 %
EOSINOPHILS ABSOLUTE COUNT: 0 10*9/L (ref 0.0–0.5)
EOSINOPHILS RELATIVE PERCENT: 0.1 %
HEMATOCRIT: 30.1 % — ABNORMAL LOW (ref 34.0–44.0)
HEMOGLOBIN: 9.9 g/dL — ABNORMAL LOW (ref 11.3–14.9)
LYMPHOCYTES ABSOLUTE COUNT: 1 10*9/L — ABNORMAL LOW (ref 1.1–3.6)
LYMPHOCYTES RELATIVE PERCENT: 9.4 %
MEAN CORPUSCULAR HEMOGLOBIN CONC: 33 g/dL (ref 32.0–36.0)
MEAN CORPUSCULAR HEMOGLOBIN: 30.4 pg (ref 25.9–32.4)
MEAN CORPUSCULAR VOLUME: 92.1 fL (ref 77.6–95.7)
MEAN PLATELET VOLUME: 8.3 fL (ref 6.8–10.7)
MONOCYTES ABSOLUTE COUNT: 1.1 10*9/L — ABNORMAL HIGH (ref 0.3–0.8)
MONOCYTES RELATIVE PERCENT: 11.1 %
NEUTROPHILS ABSOLUTE COUNT: 8.1 10*9/L — ABNORMAL HIGH (ref 1.8–7.8)
NEUTROPHILS RELATIVE PERCENT: 78.9 %
NUCLEATED RED BLOOD CELLS: 0 /100{WBCs} (ref <=4)
PLATELET COUNT: 178 10*9/L (ref 150–450)
RED BLOOD CELL COUNT: 3.27 10*12/L — ABNORMAL LOW (ref 3.95–5.13)
RED CELL DISTRIBUTION WIDTH: 16.7 % — ABNORMAL HIGH (ref 12.2–15.2)
WBC ADJUSTED: 10.2 10*9/L (ref 3.6–11.2)

## 2024-07-03 LAB — CK: CREATINE KINASE TOTAL: 27 U/L — ABNORMAL LOW (ref 34.0–145.0)

## 2024-07-03 LAB — URINALYSIS WITH MICROSCOPY WITH CULTURE REFLEX PERFORMABLE
BACTERIA: NONE SEEN /HPF
BILIRUBIN UA: NEGATIVE
GLUCOSE UA: NEGATIVE
HYALINE CASTS: 23 /LPF — ABNORMAL HIGH (ref 0–1)
KETONES UA: NEGATIVE
NITRITE UA: NEGATIVE
PH UA: 5 (ref 5.0–9.0)
PROTEIN UA: NEGATIVE
RBC UA: 17 /HPF — ABNORMAL HIGH (ref <=4)
SPECIFIC GRAVITY UA: 1.012 (ref 1.003–1.030)
SQUAMOUS EPITHELIAL: 5 /HPF (ref 0–5)
UROBILINOGEN UA: <2
WBC UA: 21 /HPF — ABNORMAL HIGH (ref 0–5)

## 2024-07-03 LAB — APTT
APTT: 42.4 s — ABNORMAL HIGH (ref 24.8–38.4)
HEPARIN CORRELATION: 0.2

## 2024-07-03 LAB — TSH: THYROID STIMULATING HORMONE: 9.066 u[IU]/mL — ABNORMAL HIGH (ref 0.550–4.780)

## 2024-07-03 LAB — C-REACTIVE PROTEIN: C-REACTIVE PROTEIN: 20.3 mg/L — ABNORMAL HIGH (ref <=10.0)

## 2024-07-03 LAB — VITAMIN B12: VITAMIN B-12: 314 pg/mL (ref 211–911)

## 2024-07-03 LAB — PROTIME-INR
INR: 1.68
PROTIME: 19.1 s — ABNORMAL HIGH (ref 9.9–12.6)

## 2024-07-03 LAB — PRO-BNP: PRO-BNP: 5774 pg/mL — ABNORMAL HIGH (ref <=300.0)

## 2024-07-03 LAB — T4, FREE: FREE T4: 1.22 ng/dL (ref 0.89–1.76)

## 2024-07-03 LAB — FOLATE: FOLATE: >24 ng/mL (ref >=5.4)

## 2024-07-03 LAB — MAGNESIUM: MAGNESIUM: 2.6 mg/dL (ref 1.6–2.6)

## 2024-07-03 LAB — PHOSPHORUS: PHOSPHORUS: 4.6 mg/dL (ref 2.4–5.1)

## 2024-07-03 MED ADMIN — lidocaine (ASPERCREME) 4 % 1 patch: 1 | TRANSDERMAL | @ 21:00:00

## 2024-07-03 MED ADMIN — furosemide (LASIX) injection 40 mg: 40 mg | INTRAVENOUS | @ 16:00:00 | Stop: 2024-07-03

## 2024-07-03 MED ADMIN — ipratropium (ATROVENT) 0.02 % nebulizer solution 500 mcg: 500 ug | RESPIRATORY_TRACT | @ 14:00:00 | Stop: 2024-07-03

## 2024-07-03 MED ADMIN — ampicillin-sulbactam (UNASYN) 1.5 g in sodium chloride 0.9 % (NS) 100 mL IVPB-MBP: 1.5 g | INTRAVENOUS | @ 21:00:00 | Stop: 2024-07-07

## 2024-07-03 MED ADMIN — sodium chloride 3 % NEBULIZER solution 4 mL: 4 mL | RESPIRATORY_TRACT | @ 20:00:00

## 2024-07-03 MED ADMIN — polyethylene glycol (MIRALAX) packet 17 g: 17 g | ORAL | @ 21:00:00

## 2024-07-03 MED ADMIN — sodium chloride 0.9% (NS) bolus 250 mL: 250 mL | INTRAVENOUS | @ 16:00:00 | Stop: 2024-07-03

## 2024-07-03 MED ADMIN — lactated ringers bolus 500 mL: 500 mL | INTRAVENOUS | @ 20:00:00 | Stop: 2024-07-03

## 2024-07-03 MED ADMIN — ipratropium-albuterol (DUO-NEB) 0.5-2.5 mg/3 mL nebulizer solution 3 mL: 3 mL | RESPIRATORY_TRACT | @ 20:00:00

## 2024-07-03 MED ADMIN — acetaminophen (TYLENOL) tablet 1,000 mg: 1000 mg | ORAL | @ 21:00:00

## 2024-07-03 MED ADMIN — albuterol 2.5 mg /3 mL (0.083 %) nebulizer solution 5 mg: 5 mg | RESPIRATORY_TRACT | @ 14:00:00 | Stop: 2024-07-03

## 2024-07-03 MED ADMIN — midodrine (PROAMATINE) tablet 5 mg: 5 mg | ORAL | @ 21:00:00

## 2024-07-03 NOTE — Unmapped (Signed)
 Your Medication List          PAUSE taking these medications       metOLazone  5 MG tablet  Wait to take this until your doctor or other care provider tells you to start again.  Commonly known as: ZAROXOLYN   Take 1 tablet (5 mg total) by mouth daily as needed (up to 3x weekly when instructed by cardiology clinic).                STOP taking these medications       amoxicillin -clavulanate 500-125 mg per tablet  Commonly known as: AUGMENTIN       azithromycin  250 MG tablet  Commonly known as: ZITHROMAX  Z-PAK                CHANGE how you take these medications       albuterol  90 mcg/actuation inhaler  Commonly known as: PROVENTIL  HFA;VENTOLIN  HFA  Inhale 1 puff every six (6) hours as needed for wheezing.  What changed: Another medication with the same name was removed. Continue taking this medication, and follow the directions you see here.                CONTINUE taking these medications       ACCU-CHEK GUIDE TEST STRIPS Strp  Generic drug: blood sugar diagnostic  Use to check blood sugar daily      ACCU-CHEK SOFTCLIX LANCETS lancets  Generic drug: lancets  CHECK BLOOD SUGAR DAILY      acetaminophen  500 MG tablet  Commonly known as: TYLENOL   Take 2 tablets (1,000 mg total) by mouth Three (3) times a day as needed for pain.      amiodarone  200 MG tablet  Commonly known as: PACERONE   Take 1 tablet (200 mg total) by mouth daily.      AMVUTTRA  25 mg/0.5 mL injection  Generic drug: vutrisiran   Inject 0.5 mL (25 mg total) under the skin once. Every 12 Weeks      apixaban  2.5 mg Tab  Commonly known as: ELIQUIS   Take 1 tablet (2.5 mg total) by mouth two (2) times a day.      benzonatate  200 MG capsule  Commonly known as: TESSALON   Take 1 capsule (200 mg total) by mouth Three (3) times a day as needed for cough for up to 7 days.      cranberry 500 mg Cap  Take 500 mg by mouth daily with evening meal.      cyclobenzaprine  10 MG tablet  Commonly known as: FLEXERIL   Take 1 tablet (10 mg total) by mouth two (2) times a day as needed for muscle spasms.      ergocalciferol -1,250 mcg (50,000 unit) 1,250 mcg (50,000 unit) capsule  Commonly known as: DRISDOL   TAKE 1 CAPSULE ONCE A WEEK      fluticasone  propion-salmeterol 115-21 mcg/actuation inhaler  Commonly known as: ADVAIR  HFA  Inhale 2 puffs two (2) times a day.      fosfomycin 3 gram Pack  Commonly known as: MONUROL   Take 3 g by mouth once a week.      gabapentin  100 MG capsule  Commonly known as: NEURONTIN   Take 1 capsule (100 mg total) by mouth two (2) times a day.      LIDOCAINE  PAIN RELIEF  4 % patch  Generic drug: lidocaine   Place 1 patch on the skin daily. Remove after 12 hours.      midodrine  5 MG tablet  Commonly known as: PROAMATINE   Take 1 tablet (  5 mg total) by mouth three (3) times a day.      NARCAN 4 mg/actuation nasal spray  Generic drug: naloxone  1 spray into alternating nostrils once as needed (opioid overdose). PRN - Emergency use.      nystatin  100,000 unit/gram powder  Commonly known as: MYCOSTATIN   Apply to affected area 3 times daily      oxyCODONE  5 MG immediate release tablet  Commonly known as: ROXICODONE   Take 1 tablet (5 mg total) by mouth every eight (8) hours as needed for pain.      OXYGEN -AIR DELIVERY SYSTEMS MISC  5 L by Miscellaneous route. Currently using 3  L/min via [place]      pantoprazole  20 MG tablet  Commonly known as: Protonix   TAKE 1 TABLET(20 MG) BY MOUTH DAILY      rosuvastatin  5 MG tablet  Commonly known as: CRESTOR   TAKE 1 TABLET EVERY OTHER DAY      SPIRIVA  RESPIMAT 2.5 mcg/actuation inhalation mist  Generic drug: tiotropium bromide   Inhale 2 puffs daily.      spironolactone  25 MG tablet  Commonly known as: ALDACTONE   Take 0.5 tablets (12.5 mg total) by mouth daily.      torsemide  20 MG tablet  Commonly known as: DEMADEX   Take 4 tablets (80 mg total) by mouth daily.      VYNDAMAX  61 mg Cap  Generic drug: tafamidis   Take 1 capsule (61 mg) by mouth daily.                ASK your doctor about these medications       traZODone  50 MG tablet  Commonly known as: DESYREL   Take 1 tablet (50 mg total) by mouth nightly as needed for sleep.

## 2024-07-03 NOTE — Unmapped (Signed)
 [facility] Medicine   History and Physical       Assessment and Plan     Barbara Huber is a 83 y.o. woman with PMH of pulmonary hypertension, dyspnea with exertion, COPD with chronic hypoxemic respiratory failure on 3 L [place], GERD, stage IV/V [name], [name], persistent atrial fibrillation on amiodarone  who presents with generalized weakness.     Acute onset generalized weakness   Illness that poses a threat to life or bodily function without appropriate treatment  At baseline she ambulates with a walker but now requiring moderate assist to get to the bedside commode. She has involuntary jerking of extremities likely due to uremia. Vitals stable with hypoxemia at baseline saturating 98% on 3 L [place]. VBG shows no acidosis or alkalosis. Labs also significant for no leukocytosis, no significant electrolyte abnormalities, and normal liver enzymes and T bili. Cr 2.86 up from baseline 1.7-2.3 consistent with mild AKI; BUN elevated to 89. Previously TSH 16.9 on 06/08/24 with normal free T4. Differential diagnosis includes infection vs arrhythmia vs thyroid disorder vs malignancy. CXR shows mild interstitial thickening and possible very small pleural effusions. UA could be consistent with UTI but also significant for dehydration given elevated hyaline casts. Of note, patient's weight was 164 lbs on 7/6 prior to admission down from 171 lbs on her home scale when she left the hospital, which suggests she is not fluid up at this time. Though she has mild pitting edema of the distal LEs, she otherwise appears dry on exam as well. Strongest suspicion is that her weakness and shakiness is due to uremia in the setting of prerenal AKI due to overdiuresis.   - Add on TSH, CRP, iron  panel  - Treating for UTI as below  - Hydrate patient : She received 250 ml IV fluid bolus and 40 IV lasix  while in the ED; Give additional 500 ml IV fluids  - Holding home diuretics (Torsemide  80 mg daily and Spironolactone  12.5 mg daily)  - Daily weights  - Strict ins and outs  - PT/OT consults    Productive cough I COPD with chronic hypoxemic respiratory failure  Patient was recently admitted 6/16-6/24 for COPD exacerbation with persistent wet cough and improved with antibiotics, airway clearance, and supportive care. Since then it sounds like the cough has waxed and waned a bit. It got better but never fully went away and now over the past few days seems worse again per patient's daughter -- more wet and more frequent. Still cough is not as persistent as it was when patient was admitted in June. No frank pneumonia on CXR. Cough could be due to chronic bronchitis but I do wonder about silent aspiration. Patient does not noticeably choke when eating or drinking.  - Continue regular diet for now  - Check lower respiratory cx; Antibiotics as below for UTI  - MBSS ordered  - Airway clearance with metaneb Duonebs QID & HTS QID (cont aerobika at home)  - Holding home Spiriva  while on [employer]  - Continue home LABA/Steroid inhaler  - Continue home oxygen  3 L [place]    Pyuria  UA shows 21 WBCs, 17 RBCs and 23 hyaline casts, large LE, but negative nitrite. Patient is asymptomatic with no dysuria, urinary urgency, incontinence, urinary retention or increased urianary frequency. This UA may be consistent with UTI vs just dehydration. For now will treat given acute onset weakness but will also follow up urine culture.  - Start Unasyn  7/7  - Follow up urine culture  -  Patient is on fosfomycin weekly to prevent UTI    AKI on [name]  Cr 2.86 up from baseline 1.7-2.3 with elevated BUN to 89. Given hyaline casts on UA, most likely due to dehydration.   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     Atrial fibrillation  - Continue home eliquis , amiodarone     Chronic hypotension  - Continue home midodrine  -- I wonder if this can be weaned or why she needs this?    Chronic anemia  Hb 9.9 is within baseline range. Recent ferritin wnl.  - Check iron  panel and consider IV iron  as appropriate  - Continue home oral iron  supplements q48h    Chronic back pain  - Continue home oxycodone , lidocaine  patches, flexeril , & gabapentin     GERD  - Continue home PPI    Chronic constipation  - Continue home miralax  1-2 times per day    DVT Prophylaxis  - Patient on home eliquis     Diet  -Nutrition Therapy Regular/House    Code Status / HCDM  -Full Code, Discussed with patient at the time of admission   -  HCDM (With Legal Document To Support): [name],[name] - Daughter - [phone]    Anticipated Medically Ready for Discharge: Anticipated in 2-4 Days    Significant Comorbid Conditions:    -Anemia POA requiring further investigation or monitoring  -Chronic kidney disease POA requiring further investigation, treatment, or monitoring  -Age related debility POA requiring additional resources: DME, PT, or OT  -Dehydration POA requiring further investigation, treatment, or monitoring    Issues Impacting Complexity of Management:  -The patient is at high risk from Hospital immobility in an elderly patient given baseline poor functional status with a high risk of causing delirium and further decline in function  -The patient is at high risk for the development of complications of volume overload due to the need to provide IV hydration for suspected hypovolemia in the setting of: stage IV or greater [name]    Medical Decision Making: Independently interpreted CXR notable for no focal consolidation, increased interstitial markings, no clear pleural effusion. Discussed the patient's management and/or test interpretation with ED Provider as summarized within this note    I personally spent greater than 75 minutes face-to-face and non-face-to-face in the care of this patient, which includes all pre, intra, and post visit time on the date of service.  All documented time was specific to the E/M visit and does not include any procedures that may have been performed.    HPI      Barbara Huber is a 83 y.o. 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     Of note, patient was recently admitted 6/16-6/24 for COPD exacerbation and improved with antibiotics, airway clearance, and supportive care.     Per discussion with patient and her daughter at bedside, on Sunday afternoon the patient developed increasing weakness to the extent that she feels her legs are shaky and giving out when attempting to stand up.  At baseline she ambulates with a walker but now requiring moderate assist to get  to the bedside commode. Patient does not feel that she has been more short of breath than usual. Her chronic cough has become more productive since going home from the hospital but it never fully resolved. No fevers or chills, nausea/vomiting, dysuria, hemoptysis, change in urinary output, chest pain, light headedness or dizziness. She has been taking all of her home medications as prescribed.    Since discharge in June, she has been taking Torsemide  80 mg daily and Spironolactone  12.5 mg daily. Messaged her cardiologist 7/3 for increased swelling to feet/ankles and was told to increase to Torsemide  60 mg BID for a few days and take Metolazone  5 mg once. Per patient's daughter, she took metolazone  once on Thursday and then increased torsemide  as instructed on Friday and Saturday. She did not take any torsemide  or spironolactone  on Sunday due to weakness and concern for overdiuresis. Swelling in ankles has improved. Of note, patient's weight was 164 lbs on 7/6 prior to admission down from 171 lbs on her home scale when she left the hospital.     Of note, patient stopped taking levothyroxine in June due to elevated TSH 16 per patient's PCP, Dr. Myrna. No new medications or other medication changes.     ER course was notable for hemodynamically stable and afebrile, saturating well on room air. Labs show no leukocytosis, Hb 9.9 (unchanged from prior), electrolytes within normal limits, Cr 2.86 up from baseline 1.5-2.5, normal AST/ALT, mildly elevated alk phos 129, normal T bili 0.7, albumin 2.9.    Med Rec Confidence   I reviewed the Medication List. The current list is Accurate    Physical Exam   Temp:  [36.4 ??C (97.6 ??F)] 36.4 ??C (97.6 ??F)  Pulse:  [65] 65  Resp:  [18] 18  BP: (108)/(57) 108/57  SpO2:  [100 %] 100 %  There is no height or weight on file to calculate BMI.  General: Well appearing, alert, conversant, cooperative, and in no distress.  Eyes: No scleral icterus. Appropriate eye contact.  ENT: Normal appearing nose. Oropharyngeal mucus membranes appear dry.  Cardiovascular: Normal rate, irregular rhythm. No murmur. JVP is not visible even when reclined but hepatojugular reflux present.  Extremities: Warm to touch. Regular 2+ radial pulses bilaterally. 1+ pitting distal LE edema L > R (chronic asymmetry per patient).   Chest: Normal respiratory effort on 3 L [place] saturating 98%. Left basilar crackles. No wheezes.  Abdomen: Soft, non-tender, non-distended with normoactive bowel sounds.  GU: No foley  Neurologic: Alert and fully oriented. Normal speech and language. Moving all extremities purposefully. No focal deficit. Asterixis present.  Skin: Skin is warm, dry and intact. No rash.  Psychiatric: Calm, cooperative, appropriate affect and behavior.  ___________________________________________________________________    Medications     Prior to Admission medications   Medication Dose, Route, Frequency   acetaminophen  (TYLENOL ) 500 MG tablet 1,000 mg, Oral, 3 times a day PRN   albuterol  HFA 90 mcg/actuation inhaler 1 puff, Inhalation, Every 6 hours PRN   amiodarone  (PACERONE ) 200 MG tablet 200 mg, Oral, Daily (standard)   apixaban  (ELIQUIS ) 2.5 mg Tab 2.5 mg, Oral, 2 times a day (standard)   blood sugar diagnostic (ACCU-CHEK GUIDE TEST STRIPS) Strp Use to check blood sugar daily   cranberry 500 mg cap 500 mg, Daily   cyclobenzaprine  (FLEXERIL ) 10 MG tablet 10 mg, Oral, 2 times a day PRN  Patient taking differently: Take 1 tablet (10 mg total) by mouth nightly.   ergocalciferol -1,250 mcg, 50,000 unit, (DRISDOL ) 1,250  mcg (50,000 unit) capsule TAKE 1 CAPSULE ONCE A WEEK   FEROSUL 325 mg (65 mg iron ) tablet TAKE 1 TABLET EVERY MONDAY , WEDNESDAY, AND FRIDAY   fluticasone  propion-salmeterol (ADVAIR  HFA) 115-21 mcg/actuation inhaler INHALE 2 PUFFS TWICE A DAY   fosfomycin (MONUROL ) 3 gram Pack 3 g, Oral, Weekly   gabapentin  (NEURONTIN ) 100 MG capsule 100 mg, Oral, 2 times a day (standard)   inhalational spacing device (AEROCHAMBER MV) Spcr 1 spacer for inhaler   lancets (ACCU-CHEK SOFTCLIX LANCETS) Misc CHECK BLOOD SUGAR DAILY   lidocaine  (ASPERCREME) 4 % patch 1 patch, Transdermal, Daily (standard), Remove after 12 hours.   [Paused] metOLazone  (ZAROXOLYN ) 5 MG tablet 5 mg, Oral, Daily PRN  Paused since Fri 06/16/2024 until manually resumed   midodrine  (PROAMATINE ) 5 MG tablet 5 mg, Oral, 3 times a day   NARCAN 4 mg/actuation nasal spray 1 spray, Once as needed   nystatin  (MYCOSTATIN ) 100,000 unit/gram powder Apply to affected area 3 times daily   oxyCODONE  (ROXICODONE ) 5 MG immediate release tablet 5 mg, Every 8 hours PRN  Patient taking differently: Take 1 tablet (5 mg total) by mouth every eight (8) hours as needed for pain. Pt stated taking TWICE A DAY   OXYGEN -AIR DELIVERY SYSTEMS MISC 4 L, Miscellaneous, Continuous   pantoprazole  (PROTONIX ) 20 MG tablet 20 mg, Oral, Daily (standard), TAKE 1 TABLET(20 MG) BY MOUTH DAILY   rosuvastatin  (CRESTOR ) 5 MG tablet TAKE 1 TABLET EVERY OTHER DAY   spironolactone  (ALDACTONE ) 25 MG tablet 12.5 mg, Oral, Daily (standard)   tafamidis  (VYNDAMAX ) 61 mg cap Take 1 capsule (61 mg) by mouth daily.   tiotropium bromide  (SPIRIVA  RESPIMAT) 2.5 mcg/actuation inhalation mist 2 puffs, Inhalation, Daily (standard)   torsemide  (DEMADEX ) 20 MG tablet 80 mg, Oral, Daily (standard)  Patient taking differently: Take 4 tablets by mouth daily. Currently taking 60mg  twice daily through the weekend per Damien Lee, MD   vutrisiran  (AMVUTTRA ) 25 mg/0.5 mL injection 25 mg, Once       Allergies   Nitrofurantoin and Lipitor  [atorvastatin ]     Medical History   Past Medical History[1]    Social History   Tobacco use:   reports that she quit smoking about 6 years ago. Her smoking use included cigarettes. She has never used smokeless tobacco.  Alcohol use:   reports no history of alcohol use.  Drug use:  reports no history of drug use.    Family History   Family History[2]    Surgical History   Past Surgical History[3]         [1]   Past Medical History:  Diagnosis Date    Acute exacerbation of CHF (congestive heart failure)    08/19/2023    Acute kidney injury superimposed on chronic kidney disease 10/11/2020    Acute on chronic diastolic (congestive) heart failure    08/23/2020    Acute on chronic diastolic congestive heart failure    08/23/2020    AKI (acute kidney injury) 04/14/2015    Lab Results  Component  Value  Date     CREATININE  1.90 (H)  06/12/2021     Had a bump in her creatinine when she was taking her diuretics every day.  She is currently taking 40 mg daily of torsemide  and 50 mg of spironolactone .  Her volume status is fragile.  Previously when she stopped her diuretic she becomes short of breath.  Plan: We will check her BMP today.  We will likely have to go  to 40    Anemia 08/22/2019    Iron  deficiency anemia          Lab Results      Component    Value    Date           WBC    9.2    03/21/2021           RBC    3.67 (L)    03/21/2021           HGB    9.9 (L)    03/21/2021           HCT    30.7 (L)    03/21/2021           MCV    83.7    03/21/2021           MCH    26.9 03/21/2021           MCHC    32.1    03/21/2021           RDW    21.9 (H)    03/21/2021           PLT    318     Arthritis     Calculus of kidney     Calculus of ureter     CHF (congestive heart failure)        Chronic atrial fibrillation    07/20/2019    COPD (chronic obstructive pulmonary disease)        Diabetes        Gangrenous cholecystitis 10/11/2020    Generalized edema  06/17/2021    GERD (gastroesophageal reflux disease)     Hydronephrosis     Hypertension     Hyponatremia 10/11/2020    Intermediate coronary syndrome    03/13/2014    Lower extremity edema 09/28/2020    Lumbar stenosis     Microscopic hematuria     Nausea alone     Nephrolithiasis 04/17/2016    Neuropathy     Nocturia     Nocturia 07/01/2017    Other chronic cystitis     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  See plans for anemia, AKI  We are also tapering her gabapentin .  Currently on 300 mg 3 times daily.  We will decrease it to twice daily, and then nightly.  She states she is not having any recurrence of her pain.    Pulmonary hypertension        Renal colic     Shortness of breath 04/28/2020    Sleep apnea     Unstable angina pectoris    03/13/2014   [2]   Family History  Problem Relation Age of Onset    Hypertension Mother     Cancer Father         COLON CANCER    No Known Problems Sister     No Known Problems Sister     No Known Problems Brother     No Known Problems Brother     No Known Problems Maternal Aunt     No Known Problems Maternal Uncle     No Known Problems Paternal Aunt     No Known Problems Paternal Uncle     No Known Problems Maternal Grandmother     No Known Problems Maternal Grandfather     No Known Problems Paternal Grandmother     No Known Problems Paternal Grandfather  No Known Problems Other     Anesthesia problems Neg Hx     Broken bones Neg Hx     Clotting disorder Neg Hx     Collagen disease Neg Hx     Diabetes Neg Hx     Dislocations Neg Hx     Fibromyalgia Neg Hx Gout Neg Hx     Hemophilia Neg Hx     Osteoporosis Neg Hx     Rheumatologic disease Neg Hx     Scoliosis Neg Hx     Severe sprains Neg Hx     Sickle cell anemia Neg Hx     Spinal Compression Fracture Neg Hx     GU problems Neg Hx     Kidney cancer Neg Hx     Prostate cancer Neg Hx    [3]   Past Surgical History:  Procedure Laterality Date    BACK SURGERY  1995    CARPAL TUNNEL RELEASE Left 2014    HYSTERECTOMY  1971    IR INSERT CHOLECYSTOSTOMY TUBE PERCUTANEOUS  10/02/2020    IR INSERT CHOLECYSTOSMY TUBE PERCUTANEOUS 10/02/2020 Ivonne Butler Seed, MD IMG [facility]    LUMBAR DISC SURGERY      PR REMOVAL GALLBLADDER N/A 10/06/2020    Procedure: CHOLECYSTECTOMY;  Surgeon: Curtistine Mira Dunnings, MD;  Location: MAIN OR [facility];  Service: Trauma    PR RIGHT HEART CATH O2 SATURATION & CARDIAC OUTPUT N/A 09/30/2020    Procedure: Right Heart Catheterization;  Surgeon: Reyes Jerilynn Sage, MD;  Location: [facility] CATH;  Service: Cardiology    PR RIGHT HEART CATH O2 SATURATION & CARDIAC OUTPUT N/A 01/09/2021    Procedure: Right Heart Catheterization;  Surgeon: Norleen Deward Grumbling, MD;  Location: [facility] CATH;  Service: Cardiology

## 2024-07-03 NOTE — Unmapped (Signed)
 [facility]  Emergency Department Provider Note        ED Clinical Impression      Final diagnoses:   AKI (acute kidney injury) (Primary)   Generalized weakness   Acute pulmonary edema              Impression, ED Course, Assessment and Plan      Impression: Barbara Huber is a 83 y.o. female with PMH significant for COPD on 3L [place], [name] stage IV/V, CHF, anemia, nephrolithiasis, pulmonary hypertension, T2DM, GERD who presents to the emergency department for generalized weakness. VSS in triage, nontoxic in appearance.     Ddx includes COPD exacerbation vs dehydration vs PNA vs CHF exacerbation.  Will concern for ACS, PE, myocarditis, pericarditis.  Could consider alternate infectious etiology however no additional symptoms reported to suggest this.  Will obtain basic labs including coags, BNP, VBG, EKG, CXR.    11:48 AM  CBC overall similar to previous.  Her creatinine is slightly increased from previous at 2.86, similar to most recent admission.  BNP elevated at 5774, CXR with concern for recurrent pulmonary edema with bilateral pleural effusions.  Per staff pt requiring moderate assist even with small transfer to the bedside commode.  UA pending admit.  MAO paged for admission.           Additional Medical Decision Making     I have reviewed the vital signs and the nursing notes. Labs and radiology results that were available during my care of the patient were independently reviewed by me and considered in my medical decision making.     I directly visualized and independently interpreted the EKG tracing.   I independently visualized the radiology images.   I reviewed the patient's prior medical records (meds, hx, admission note 6/17).   I discussed the case and plan for continuity of care with the admitting provider.     Portions of this record have been created using Scientist, clinical (histocompatibility and immunogenetics). Dictation errors have been sought, but may not have been identified and corrected.  ____________________________________________         History        Chief Complaint  Fatigue      HPI   Barbara Huber is a 83 y.o. 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  Per chart review, patient with recent admission 6/16-6/24 for COPD/CHF exacerbation.  Initially given zosyn  with transition to ceftriaxone  and doxycycline  during admission.  Dishcarged on 5d course of cefdinir  and doxycycline .  Steroids deferred per daughter request. For fluid overload, extra doses of metolazone  trialed prior to admission with resumption of torsemide  and spironolactone  during admission.  Metolazone  subsequently restarted after discharge.  Per discussion with patient and her daughter to bedside, over the last 48 hours, patient has had increasing weakness to the extent that she feels her legs are giving out when attempting to ambulate.  She does use a walker at baseline, has not had any falls.  Feels very weak and fatigued.  Has had an ongoing productive cough intermittently, daughter felt that she was wheezing last night.  Has been compliant with prescribed medications, does not feel significantly out of breath however daughter states that she has appeared more winded, gets tired easily and has to sit down to rest.  Messaged her cardiologist 7/3 and was instructed to take extra metalazone which they did as  she had some swelling noted to feet/ankles. Also increased Torsemide  to 60mg  BID sat/sun and then back to prescribed 80mg  daily.  Swelling has improved slightly.    Has not had any fevers, hemoptysis, n/v/d, dysuria or decreased/increased urinary output, chest pain, or lightheadedness/dizziness although the triage note does state this.       Past Medical History[1]    Problem List[2]    Past Surgical History[3]    No current facility-administered medications for this encounter.    Current Outpatient Medications:     acetaminophen  (TYLENOL ) 500 MG tablet, Take 2 tablets (1,000 mg total) by mouth Three (3) times a day as needed for pain., Disp: , Rfl:     albuterol  HFA 90 mcg/actuation inhaler, Inhale 1 puff every six (6) hours as needed for wheezing., Disp: 8 g, Rfl: 3    amiodarone  (PACERONE ) 200 MG tablet, Take 1 tablet (200 mg total) by mouth daily., Disp: 30 tablet, Rfl: 11    apixaban  (ELIQUIS ) 2.5 mg Tab, Take 1 tablet (2.5 mg total) by mouth two (2) times a day., Disp: 180 tablet, Rfl: 3    blood sugar diagnostic (ACCU-CHEK GUIDE TEST STRIPS) Strp, Use to check blood sugar daily, Disp: 50 strip, Rfl: 3    cranberry 500 mg cap, Take 500 mg by mouth daily with evening meal., Disp: , Rfl:     cyclobenzaprine  (FLEXERIL ) 10 MG tablet, Take 1 tablet (10 mg total) by mouth two (2) times a day as needed for muscle spasms. (Patient taking differently: Take 1 tablet (10 mg total) by mouth nightly.), Disp: 60 tablet, Rfl: 11    ergocalciferol -1,250 mcg, 50,000 unit, (DRISDOL ) 1,250 mcg (50,000 unit) capsule, TAKE 1 CAPSULE ONCE A WEEK, Disp: 12 capsule, Rfl: 0    FEROSUL 325 mg (65 mg iron ) tablet, TAKE 1 TABLET EVERY MONDAY , WEDNESDAY, AND FRIDAY, Disp: 36 tablet, Rfl: 0    fluticasone  propion-salmeterol (ADVAIR  HFA) 115-21 mcg/actuation inhaler, INHALE 2 PUFFS TWICE A DAY, Disp: 12 g, Rfl: 5    fosfomycin (MONUROL ) 3 gram Pack, Take 3 g by mouth once a week., Disp: 4 packet, Rfl: 6    gabapentin  (NEURONTIN ) 100 MG capsule, Take 1 capsule (100 mg total) by mouth two (2) times a day., Disp: 60 capsule, Rfl: 11    inhalational spacing device (AEROCHAMBER MV) Spcr, 1 spacer for inhaler, Disp: 1 each, Rfl: 0    lancets (ACCU-CHEK SOFTCLIX LANCETS) Misc, CHECK BLOOD SUGAR DAILY, Disp: 200 each, Rfl: 11    lidocaine  (ASPERCREME) 4 % patch, Place 1 patch on the skin daily. Remove after 12 hours., Disp: 30 patch, Rfl: 2    [Paused] metOLazone  (ZAROXOLYN ) 5 MG tablet, Take 1 tablet (5 mg total) by mouth daily as needed (up to 3x weekly when instructed by cardiology clinic)., Disp: , Rfl:     midodrine  (PROAMATINE ) 5 MG tablet, Take 1 tablet (5 mg total) by mouth three (3) times a day., Disp: 90 tablet, Rfl: 11    NARCAN 4 mg/actuation nasal spray, 1 spray into alternating nostrils once as needed (opioid overdose). PRN - Emergency use., Disp: , Rfl:     nystatin  (MYCOSTATIN ) 100,000 unit/gram powder, Apply to affected area 3 times daily, Disp: 30 g, Rfl: 1    oxyCODONE  (ROXICODONE ) 5 MG immediate release tablet, Take 1 tablet (5 mg total) by mouth every eight (8) hours as needed for pain. (Patient taking differently: Take 1 tablet (5 mg total) by mouth every eight (8) hours as needed for pain.  Pt stated taking TWICE A DAY), Disp: , Rfl:     OXYGEN -AIR DELIVERY SYSTEMS MISC, 4 L by Miscellaneous route continuous., Disp: , Rfl:     pantoprazole  (PROTONIX ) 20 MG tablet, TAKE 1 TABLET(20 MG) BY MOUTH DAILY, Disp: 90 tablet, Rfl: 3    rosuvastatin  (CRESTOR ) 5 MG tablet, TAKE 1 TABLET EVERY OTHER DAY, Disp: 45 tablet, Rfl: 3    spironolactone  (ALDACTONE ) 25 MG tablet, Take 0.5 tablets (12.5 mg total) by mouth daily., Disp: 30 tablet, Rfl: 2    tafamidis  (VYNDAMAX ) 61 mg cap, Take 1 capsule (61 mg) by mouth daily., Disp: 30 capsule, Rfl: 11    tiotropium bromide  (SPIRIVA  RESPIMAT) 2.5 mcg/actuation inhalation mist, Inhale 2 puffs daily., Disp: 4 g, Rfl: 11    torsemide  (DEMADEX ) 20 MG tablet, Take 4 tablets (80 mg total) by mouth daily. (Patient taking differently: Take 4 tablets by mouth daily. Currently taking 60mg  twice daily through the weekend per Damien Lee, MD), Disp: 360 tablet, Rfl: 3    vutrisiran  (AMVUTTRA ) 25 mg/0.5 mL injection, Inject 0.5 mL (25 mg total) under the skin once. Every 12 Weeks, Disp: , Rfl:     Allergies  Nitrofurantoin and Lipitor  [atorvastatin ]    Family History[4]    Social History  Short Social History[5]       Physical Exam     This provider entered the patient's room: Yes:    If this provider did not enter the room, a comprehensive physical exam was not able to be performed due to increased infection risk to themselves, other providers, staff and other patients), as well as to conserve personal protective equipment (PPE) utilization during the COVID-19 pandemic.    If this provider did enter the patient room, the following was PPE worn: Surgical mask, eye protection and gloves    ED Triage Vitals [07/03/24 0938]   Enc Vitals Group      BP 108/57      Pulse 65      SpO2 Pulse       Resp 18      Temp 36.4 ??C (97.6 ??F)      Temp Source Oral      SpO2 100 %     Constitutional: Alert and oriented.  Chronically ill-appearing however no acute distress.  Eyes: Conjunctivae are normal.  ENT       Head: Normocephalic and atraumatic.       Nose: No congestion.       Mouth/Throat: Mucous membranes are dry.  Cardiovascular: Normal rate, regular rhythm. No murmurs, rubs or gallops.  Bilateral DPs 2+.  No significant lower extremity edema noted.  Respiratory: Shallow, slightly tachypneic respiratory effort. Breath sounds are worse throughout with inspiratory expiratory wheeze and significant diminished in the bases.  Gastrointestinal: Soft and nondistended, nontender with deep palpation throughout.  Neurologic: Normal speech and language. No gross focal neurologic deficits are appreciated.  GCS 15.  Skin: Skin is warm, dry and intact. No rash noted.  Psychiatric: Mood and affect are normal. Speech and behavior are normal.     EKG     Normal rate, regular rhythm, 62bpm  Left axis deviation  Normal intervals excluding prolonged PR, QT due to intraventricular conduction delay  No significant ST elevation or depression     Radiology     XR Chest 2 views   Final Result      *  Mild interstitial thickening could be due to mild edema or infectious/inflammatory airways disease.   *  Query small bilateral pleural effusions.                      Procedures     N/a             [1]   Past Medical History:  Diagnosis Date    Acute exacerbation of CHF (congestive heart failure)    08/19/2023    Acute kidney injury superimposed on chronic kidney disease 10/11/2020    Acute on chronic diastolic (congestive) heart failure    08/23/2020    Acute on chronic diastolic congestive heart failure    08/23/2020    AKI (acute kidney injury) 04/14/2015    Lab Results  Component  Value  Date     CREATININE  1.90 (H)  06/12/2021     Had a bump in her creatinine when she was taking her diuretics every day.  She is currently taking 40 mg daily of torsemide  and 50 mg of spironolactone .  Her volume status is fragile.  Previously when she stopped her diuretic she becomes short of breath.  Plan: We will check her BMP today.  We will likely have to go to 40    Anemia 08/22/2019    Iron  deficiency anemia          Lab Results      Component    Value    Date           WBC    9.2    03/21/2021           RBC    3.67 (L)    03/21/2021           HGB    9.9 (L)    03/21/2021           HCT    30.7 (L)    03/21/2021           MCV    83.7    03/21/2021           MCH    26.9    03/21/2021           MCHC    32.1    03/21/2021           RDW    21.9 (H)    03/21/2021           PLT    318     Arthritis     Calculus of kidney     Calculus of ureter     CHF (congestive heart failure)        Chronic atrial fibrillation    07/20/2019    COPD (chronic obstructive pulmonary disease)        Diabetes        Gangrenous cholecystitis 10/11/2020    Generalized edema  06/17/2021    GERD (gastroesophageal reflux disease)     Hydronephrosis     Hypertension     Hyponatremia 10/11/2020    Intermediate coronary syndrome    03/13/2014    Lower extremity edema 09/28/2020    Lumbar stenosis     Microscopic hematuria     Nausea alone     Nephrolithiasis 04/17/2016    Neuropathy     Nocturia     Nocturia 07/01/2017    Other chronic cystitis     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  See plans for anemia, AKI  We are also tapering her gabapentin .  Currently  on 300 mg 3 times daily.  We will decrease it to twice daily, and then nightly.  She states she is not having any recurrence of her pain.    Pulmonary hypertension        Renal colic     Shortness of breath 04/28/2020    Sleep apnea     Unstable angina pectoris    03/13/2014   [2]   Patient Active Problem List  Diagnosis    Spinal stenosis of lumbar region    Thoracic or lumbosacral neuritis or radiculitis    Lumbosacral spondylosis    COPD (chronic obstructive pulmonary disease)       Sleep apnea in adult    Essential hypertension    Type 2 diabetes mellitus with stage 4 chronic kidney disease, with long-term current use of insulin        Chronic cystitis    Incomplete bladder emptying    Peripheral neuropathy    Chronic respiratory failure with hypoxia       Persistent atrial fibrillation       Anemia in chronic kidney disease    Chronic kidney disease ([name]), stage IV (severe)       Pulmonary hypertension       Chronic prescription opiate use    Bronchitis    Chronic heart failure with preserved ejection fraction       Cardiac amyloidosis       Recurrent cold sores    Dyspepsia    Iron  deficiency anemia due to chronic blood loss    Neuropathy    Left hip pain    Familial amyloid heart disease       Hereditary amyloidosis       Osteoporosis with current pathological fracture    Troponin level elevated    Thrombocytopenia    Difficulty walking    Pneumonia due to infectious organism    Dyspnea   [3]   Past Surgical History:  Procedure Laterality Date    BACK SURGERY  1995    CARPAL TUNNEL RELEASE Left 2014    HYSTERECTOMY  1971    IR INSERT CHOLECYSTOSTOMY TUBE PERCUTANEOUS  10/02/2020    IR INSERT CHOLECYSTOSMY TUBE PERCUTANEOUS 10/02/2020 Ivonne Butler Seed, MD IMG VIR H&V [facility]    LUMBAR DISC SURGERY      PR REMOVAL GALLBLADDER N/A 10/06/2020    Procedure: CHOLECYSTECTOMY;  Surgeon: Curtistine Mira Dunnings, MD;  Location: MAIN OR [facility];  Service: Trauma    PR RIGHT HEART CATH O2 SATURATION & CARDIAC OUTPUT N/A 09/30/2020    Procedure: Right Heart Catheterization;  Surgeon: Reyes Jerilynn Sage, MD;  Location: [facility] CATH;  Service: Cardiology    PR RIGHT HEART CATH O2 SATURATION & CARDIAC OUTPUT N/A 01/09/2021    Procedure: Right Heart Catheterization;  Surgeon: Norleen Deward Grumbling, MD;  Location: [facility] CATH;  Service: Cardiology   [4]   Family History  Problem Relation Age of Onset    Hypertension Mother     Cancer Father         COLON CANCER    No Known Problems Sister     No Known Problems Sister     No Known Problems Brother     No Known Problems Brother     No Known Problems Maternal Aunt     No Known Problems Maternal Uncle     No Known Problems Paternal Aunt     No Known Problems Paternal Uncle     No Known Problems Maternal Grandmother  No Known Problems Maternal Grandfather     No Known Problems Paternal Grandmother     No Known Problems Paternal Grandfather     No Known Problems Other     Anesthesia problems Neg Hx     Broken bones Neg Hx     Clotting disorder Neg Hx     Collagen disease Neg Hx     Diabetes Neg Hx     Dislocations Neg Hx     Fibromyalgia Neg Hx     Gout Neg Hx     Hemophilia Neg Hx     Osteoporosis Neg Hx     Rheumatologic disease Neg Hx     Scoliosis Neg Hx     Severe sprains Neg Hx     Sickle cell anemia Neg Hx     Spinal Compression Fracture Neg Hx     GU problems Neg Hx     Kidney cancer Neg Hx     Prostate cancer Neg Hx    [5]   Social History  Tobacco Use    Smoking status: Former     Current packs/day: 0.00     Types: Cigarettes     Quit date: 2019     Years since quitting: 6.5    Smokeless tobacco: Never    Tobacco comments:     Quit a few years ago   Vaping Use    Vaping status: Never Used   Substance Use Topics    Alcohol use: No    Drug use: No        Gearline Woodroe Browning, FNP  07/03/24 1420

## 2024-07-03 NOTE — Unmapped (Signed)
 Clinical Pharmacy Non-Formulary Home Medication Evaluation Note     Team is requesting continuation of the patient's non-formulary home medication Tafamidis . Per PolicyStat ID: [id number] (Medication Management: Patient-Supplied Medications,) the continued use of these medications during hospital admission was evaluated by pharmacy.      Recommendation: Continuation of non-formulary home medication tafamidis  is clinically necessary and will be identified by pharmacy for continuation. Per policy, patient-supplied medications must be supplied by the patient for the duration of the encounter.     Rationale: Medication is NF, has no approved therapeutic interchanges, and is an orphan drug.     This information has been communicated to the ordering clinician. Pharmacy will continue to follow patient with team.  Please page the service pharmacist pager found in the [facility] Directory if further immediate assistance is required.    Thank you for allowing me to participate in the care of this patient.   Waddell Gander, PharmD

## 2024-07-03 NOTE — Unmapped (Signed)
 Pt arrives c/o dizziness and fatigue.   States she recently got over pneumonia and fluid overload.   Reports dec PO intake for a while.     On 3L @ home d/t COPD

## 2024-07-04 LAB — CBC
HEMATOCRIT: 27.4 % — ABNORMAL LOW (ref 34.0–44.0)
HEMOGLOBIN: 9.1 g/dL — ABNORMAL LOW (ref 11.3–14.9)
MEAN CORPUSCULAR HEMOGLOBIN CONC: 33.3 g/dL (ref 32.0–36.0)
MEAN CORPUSCULAR HEMOGLOBIN: 31 pg (ref 25.9–32.4)
MEAN CORPUSCULAR VOLUME: 93.2 fL (ref 77.6–95.7)
MEAN PLATELET VOLUME: 8.7 fL (ref 6.8–10.7)
PLATELET COUNT: 150 10*9/L (ref 150–450)
RED BLOOD CELL COUNT: 2.94 10*12/L — ABNORMAL LOW (ref 3.95–5.13)
RED CELL DISTRIBUTION WIDTH: 16.4 % — ABNORMAL HIGH (ref 12.2–15.2)
WBC ADJUSTED: 7.8 10*9/L (ref 3.6–11.2)

## 2024-07-04 LAB — IRON PANEL
IRON SATURATION (CALC): 13 %
IRON: 30 ug/dL — ABNORMAL LOW (ref 50–170)
TOTAL IRON BINDING CAPACITY (CALC): 228.1 ug/dL — ABNORMAL LOW (ref 250.0–425.0)
TRANSFERRIN: 181 mg/dL — ABNORMAL LOW (ref 250.0–380.0)

## 2024-07-04 LAB — BASIC METABOLIC PANEL
ANION GAP: 14 mmol/L (ref 5–14)
BLOOD UREA NITROGEN: 101 mg/dL — ABNORMAL HIGH (ref 9–23)
BUN / CREAT RATIO: 40
CALCIUM: 8.9 mg/dL (ref 8.7–10.4)
CHLORIDE: 94 mmol/L — ABNORMAL LOW (ref 98–107)
CO2: 31.8 mmol/L — ABNORMAL HIGH (ref 20.0–31.0)
CREATININE: 2.53 mg/dL — ABNORMAL HIGH (ref 0.55–1.02)
EGFR CKD-EPI (2021) FEMALE: 19 mL/min/1.73m2 — ABNORMAL LOW (ref >=60)
GLUCOSE RANDOM: 85 mg/dL (ref 70–179)
POTASSIUM: 3.5 mmol/L (ref 3.5–5.1)
SODIUM: 140 mmol/L (ref 135–145)

## 2024-07-04 MED ADMIN — sodium chloride 3 % NEBULIZER solution 4 mL: 4 mL | RESPIRATORY_TRACT | @ 17:00:00 | Stop: 2024-07-04

## 2024-07-04 MED ADMIN — oxyCODONE (ROXICODONE) immediate release tablet 5 mg: 5 mg | ORAL | @ 01:00:00 | Stop: 2024-07-17

## 2024-07-04 MED ADMIN — cyclobenzaprine (FLEXERIL) tablet 10 mg: 10 mg | ORAL | @ 01:00:00

## 2024-07-04 MED ADMIN — fluticasone furoate-vilanterol (BREO ELLIPTA) 100-25 mcg/dose inhaler 1 puff: 1 | RESPIRATORY_TRACT | @ 13:00:00 | Stop: 2024-07-04

## 2024-07-04 MED ADMIN — apixaban (ELIQUIS) tablet 2.5 mg: 2.5 mg | ORAL | @ 01:00:00

## 2024-07-04 MED ADMIN — ampicillin-sulbactam (UNASYN) 1.5 g in sodium chloride 0.9 % (NS) 100 mL IVPB-MBP: 1.5 g | INTRAVENOUS | @ 08:00:00 | Stop: 2024-07-04

## 2024-07-04 MED ADMIN — amiodarone (PACERONE) tablet 200 mg: 200 mg | ORAL | @ 14:00:00 | Stop: 2024-07-04

## 2024-07-04 MED ADMIN — ipratropium-albuterol (DUO-NEB) 0.5-2.5 mg/3 mL nebulizer solution 3 mL: 3 mL | RESPIRATORY_TRACT | @ 13:00:00 | Stop: 2024-07-04

## 2024-07-04 MED ADMIN — sodium chloride 3 % NEBULIZER solution 4 mL: 4 mL | RESPIRATORY_TRACT | @ 13:00:00 | Stop: 2024-07-04

## 2024-07-04 MED ADMIN — midodrine (PROAMATINE) tablet 5 mg: 5 mg | ORAL | @ 14:00:00 | Stop: 2024-07-04

## 2024-07-04 MED ADMIN — polyethylene glycol (MIRALAX) packet 17 g: 17 g | ORAL | @ 14:00:00 | Stop: 2024-07-04

## 2024-07-04 MED ADMIN — sodium chloride 3 % NEBULIZER solution 4 mL: 4 mL | RESPIRATORY_TRACT | @ 01:00:00

## 2024-07-04 MED ADMIN — acetaminophen (TYLENOL) tablet 1,000 mg: 1000 mg | ORAL | @ 01:00:00

## 2024-07-04 MED ADMIN — acetaminophen (TYLENOL) tablet 1,000 mg: 1000 mg | ORAL | @ 14:00:00 | Stop: 2024-07-04

## 2024-07-04 MED ADMIN — apixaban (ELIQUIS) tablet 2.5 mg: 2.5 mg | ORAL | @ 14:00:00 | Stop: 2024-07-04

## 2024-07-04 MED ADMIN — acetaminophen (TYLENOL) tablet 1,000 mg: 1000 mg | ORAL | @ 18:00:00 | Stop: 2024-07-04

## 2024-07-04 MED ADMIN — gabapentin (NEURONTIN) capsule 100 mg: 100 mg | ORAL | @ 01:00:00

## 2024-07-04 MED ADMIN — oxyCODONE (ROXICODONE) immediate release tablet 5 mg: 5 mg | ORAL | @ 08:00:00 | Stop: 2024-07-04

## 2024-07-04 MED ADMIN — gabapentin (NEURONTIN) capsule 100 mg: 100 mg | ORAL | @ 14:00:00 | Stop: 2024-07-04

## 2024-07-04 MED ADMIN — pantoprazole (Protonix) EC tablet 20 mg: 20 mg | ORAL | @ 14:00:00 | Stop: 2024-07-04

## 2024-07-04 MED ADMIN — tafamidis cap 61 mg **PATIENT SUPPLIED**: 61 mg | ORAL | @ 14:00:00 | Stop: 2024-07-04

## 2024-07-04 MED ADMIN — ipratropium-albuterol (DUO-NEB) 0.5-2.5 mg/3 mL nebulizer solution 3 mL: 3 mL | RESPIRATORY_TRACT | @ 17:00:00 | Stop: 2024-07-04

## 2024-07-04 MED ADMIN — midodrine (PROAMATINE) tablet 5 mg: 5 mg | ORAL | @ 18:00:00 | Stop: 2024-07-04

## 2024-07-04 MED ADMIN — oxyCODONE (ROXICODONE) immediate release tablet 5 mg: 5 mg | ORAL | @ 14:00:00 | Stop: 2024-07-04

## 2024-07-04 MED ADMIN — polyethylene glycol (MIRALAX) packet 17 g: 17 g | ORAL | @ 01:00:00

## 2024-07-04 MED ADMIN — ipratropium-albuterol (DUO-NEB) 0.5-2.5 mg/3 mL nebulizer solution 3 mL: 3 mL | RESPIRATORY_TRACT | @ 01:00:00

## 2024-07-04 NOTE — Consults (Signed)
 Speech Language Pathology Clinical Swallow Assessment  Evaluation (07/04/24 1255)    Patient Name:  Megan Bolton       Medical Record Number: [id number]   Date of Birth: [birth year]  Sex: Female            SLP Treatment Diagnosis: Dysphagia- Oropharyngeal     Activity Tolerance: Patient tolerated treatment well    Assessment     Risk for Aspiration: Mild     Recommendations:  Frequent Oral Care           Diet Liquids Recommendations: Thin Liquids, Level 0    Diet Solids Recommendation: Regular Consistency Solids    Recommended Form of Medications: Whole, With liquid      Recommended Compensatory Techniques : Upright 90 degrees    Post Acute Discharge Recommendations  Post Acute SLP Discharge Recommendations: Skilled SLP services are NOT indicated    Prognosis: Good  Positive Indicators: family support, participation, current status  Barriers to Discharge: None     Plan of Care  SLP Daily Frequency: D/C Services, D/C Services       Treatment Goals:        Patient and Family Goal: None stated    Subjective  Medical Updates Since Last Visit/Relevant PMH Affecting Clinical Decision Making: 83 y.o. woman with PMH of COPD with chronic hypoxemic respiratory failure on 3 L [place], GERD,  who presents with generalized weakness, productive cough following recent admission for COPD exacerbation.                     Communication Preference: Verbal     Pain: no s/s of pain           Allergies: Nitrofurantoin and Lipitor  [atorvastatin ]  Current Medications[1]  Past Medical History[2]  Family History[3]  Past Surgical History[4]  Social History     Tobacco Use    Smoking status: Former     Current packs/day: 0.00     Types: Cigarettes     Quit date: 2019     Years since quitting: 6.5    Smokeless tobacco: Never    Tobacco comments:     Quit a few years ago   Substance Use Topics    Alcohol use: No         General:                    Self-Feeding Capacity: Functional for self-feeding                         Services patient receives prior to admission: PT, OT    Precautions / Restrictions  Precautions: Falls precautions  Weight Bearing Status: Non-applicable  Required Braces or Orthoses: Non-applicable    Objective  Temperature Spikes Noted: No  Respiratory Status : O2 via nasal cannula (3L of O2 via [place] which is her baseline)  History of Intubation: No          Behavior/Cognition: Alert, Cooperative, Pleasant mood  Positioning : Upright in chair    Oral / Motor Exam  Vocal Quality: Normal  Volitional Swallow: Within Functional Limits   Labial ROM: Within Functional Limits   Labial Symmetry: Within Functional Limits  Labial Strength: Within Functional Limits   Lingual ROM: Within Functional Limits  Lingual Symmetry: Within Functional Limits  Lingual Strength: Within Functional Limits      Velum: elevation grossly WNL         Facial ROM: Within Functional  Limits   Facial Symmetry: Within Functional Limits  Facial Strength: Within Functional Limits      Vocal Intensity: Within Functional Limits           Dysarthria: None present   Intelligibility: Intelligible   Breath Support: Adequate for speech   Dentition: Adequate    Consistencies assessed: thin, regular solid    Patient at end of session: All needs in reach, Friends/Family present, In chair         Speech Therapy Session Duration  SLP Individual [mins]: 12    I attest that I have reviewed the above information.  Signed: Therisa Aris, SLP    Filed 07/04/2024         [1]   Current Facility-Administered Medications   Medication Dose Route Frequency Provider Last Rate Last Admin    acetaminophen  (TYLENOL ) tablet 1,000 mg  1,000 mg Oral TID Unice Almarie Caldron, MD   1,000 mg at 07/04/24 1355    amiodarone  (PACERONE ) tablet 200 mg  200 mg Oral Daily Unice Almarie Caldron, MD   200 mg at 07/04/24 1000    apixaban  (ELIQUIS ) tablet 2.5 mg  2.5 mg Oral BID Unice Almarie Caldron, MD   2.5 mg at 07/04/24 1000    calcium  carbonate (TUMS) chewable tablet 400 mg elem calcium   400 mg elem calcium  Oral TID PRN Unice Almarie Caldron, MD        cyclobenzaprine  (FLEXERIL ) tablet 10 mg  10 mg Oral Nightly Unice Almarie Caldron, MD   10 mg at 07/03/24 2048    [START ON 07/05/2024] ferrous sulfate  tablet 325 mg  325 mg Oral Every other day Unice Almarie Caldron, MD        fluticasone  furoate-vilanterol (BREO ELLIPTA ) 100-25 mcg/dose inhaler 1 puff  1 puff Inhalation Daily (RT) Unice Almarie Caldron, MD   1 puff at 07/04/24 0852    [START ON 07/07/2024] fosfomycin (MONUROL ) packet 3 g  3 g Oral Weekly Unice Almarie Caldron, MD        gabapentin  (NEURONTIN ) capsule 100 mg  100 mg Oral BID Unice Almarie Caldron, MD   100 mg at 07/04/24 1000    guaiFENesin (ROBITUSSIN) oral syrup  200 mg Oral Q4H PRN Unice Almarie Caldron, MD        ipratropium-albuterol  (DUO-NEB) 0.5-2.5 mg/3 mL nebulizer solution 3 mL  3 mL Nebulization 4x Daily (RT) Unice Almarie Caldron, MD   3 mL at 07/04/24 1233    lidocaine  (ASPERCREME) 4 % 1 patch  1 patch Transdermal Daily Unice Almarie Caldron, MD   1 patch at 07/03/24 1641    melatonin tablet 3 mg  3 mg Oral Nightly PRN Unice Almarie Caldron, MD        midodrine  (PROAMATINE ) tablet 5 mg  5 mg Oral TID Unice Almarie Caldron, MD   5 mg at 07/04/24 1355    nystatin  (MYCOSTATIN ) powder   Topical TID PRN Unice Almarie Caldron, MD        oxyCODONE  (ROXICODONE ) immediate release tablet 5 mg  5 mg Oral BID Unice Almarie Caldron, MD   5 mg at 07/04/24 1000    oxyCODONE  (ROXICODONE ) immediate release tablet 5 mg  5 mg Oral Q6H PRN Meryle Greig BRAVO, MD   5 mg at 07/04/24 0335    pantoprazole  (Protonix ) EC tablet 20 mg  20 mg Oral Daily Unice Almarie Caldron, MD   20 mg at 07/04/24 1010    polyethylene glycol (MIRALAX ) packet 17 g  17 g Oral Daily  Unice Almarie Caldron, MD   17 g at 07/04/24 1000    polyethylene glycol (MIRALAX ) packet 17 g  17 g Oral Nightly PRN Unice Almarie Caldron, MD   17 g at 07/03/24 2052    sodium chloride  3 % NEBULIZER solution 4 mL  4 mL Nebulization 4x Daily (RT) Unice Almarie Caldron, MD   4 mL at 07/04/24 1236    tafamidis  cap 61 mg **PATIENT SUPPLIED**  61 mg Oral Daily Unice Almarie Caldron, MD   61 mg at 07/04/24 1000   [2]   Past Medical History:  Diagnosis Date    Acute exacerbation of CHF (congestive heart failure)    08/19/2023    Acute kidney injury superimposed on chronic kidney disease 10/11/2020    Acute on chronic diastolic (congestive) heart failure    08/23/2020    Acute on chronic diastolic congestive heart failure    08/23/2020    AKI (acute kidney injury) 04/14/2015    Lab Results  Component  Value  Date     CREATININE  1.90 (H)  06/12/2021     Had a bump in her creatinine when she was taking her diuretics every day.  She is currently taking 40 mg daily of torsemide  and 50 mg of spironolactone .  Her volume status is fragile.  Previously when she stopped her diuretic she becomes short of breath.  Plan: We will check her BMP today.  We will likely have to go to 40    Anemia 08/22/2019    Iron  deficiency anemia          Lab Results      Component    Value    Date           WBC    9.2    03/21/2021           RBC    3.67 (L)    03/21/2021           HGB    9.9 (L)    03/21/2021           HCT    30.7 (L)    03/21/2021           MCV    83.7    03/21/2021           MCH    26.9    03/21/2021           MCHC    32.1    03/21/2021           RDW    21.9 (H)    03/21/2021           PLT    318     Arthritis     Calculus of kidney     Calculus of ureter     CHF (congestive heart failure)        Chronic atrial fibrillation    07/20/2019    COPD (chronic obstructive pulmonary disease)        Diabetes        Gangrenous cholecystitis 10/11/2020    Generalized edema  06/17/2021    GERD (gastroesophageal reflux disease)     Hydronephrosis     Hypertension     Hyponatremia 10/11/2020    Intermediate coronary syndrome    03/13/2014    Lower extremity edema 09/28/2020    Lumbar stenosis     Microscopic hematuria     Nausea alone     Nephrolithiasis 04/17/2016  Neuropathy     Nocturia     Nocturia 07/01/2017    Other chronic cystitis     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  See plans for anemia, AKI  We are also tapering her gabapentin .  Currently on 300 mg 3 times daily.  We will decrease it to twice daily, and then nightly.  She states she is not having any recurrence of her pain.    Pulmonary hypertension        Renal colic     Shortness of breath 04/28/2020    Sleep apnea     Unstable angina pectoris    03/13/2014   [3]   Family History  Problem Relation Age of Onset    Hypertension Mother     Cancer Father         COLON CANCER    No Known Problems Sister     No Known Problems Sister     No Known Problems Brother     No Known Problems Brother     No Known Problems Maternal Aunt     No Known Problems Maternal Uncle     No Known Problems Paternal Aunt     No Known Problems Paternal Uncle     No Known Problems Maternal Grandmother     No Known Problems Maternal Grandfather     No Known Problems Paternal Grandmother     No Known Problems Paternal Grandfather     No Known Problems Other     Anesthesia problems Neg Hx     Broken bones Neg Hx     Clotting disorder Neg Hx     Collagen disease Neg Hx     Diabetes Neg Hx     Dislocations Neg Hx     Fibromyalgia Neg Hx     Gout Neg Hx     Hemophilia Neg Hx     Osteoporosis Neg Hx     Rheumatologic disease Neg Hx     Scoliosis Neg Hx     Severe sprains Neg Hx     Sickle cell anemia Neg Hx     Spinal Compression Fracture Neg Hx     GU problems Neg Hx     Kidney cancer Neg Hx     Prostate cancer Neg Hx    [4]   Past Surgical History:  Procedure Laterality Date    BACK SURGERY  1995    CARPAL TUNNEL RELEASE Left 2014    HYSTERECTOMY  1971    IR INSERT CHOLECYSTOSTOMY TUBE PERCUTANEOUS  10/02/2020    IR INSERT CHOLECYSTOSMY TUBE PERCUTANEOUS 10/02/2020 Ivonne Butler Seed, MD IMG [facility]IR [facility]    LUMBAR DISC SURGERY      PR REMOVAL GALLBLADDER N/A 10/06/2020    Procedure: CHOLECYSTECTOMY;  Surgeon: Curtistine Mira Dunnings, MD;  Location: MAIN OR [facility]; Service: Trauma    PR RIGHT HEART CATH O2 SATURATION & CARDIAC OUTPUT N/A 09/30/2020    Procedure: Right Heart Catheterization;  Surgeon: Reyes Jerilynn Sage, MD;  Location: [facility] C[facility];  Service: Cardiology    PR RIGHT HEART CATH O2 SATURATION & CARDIAC OUTPUT N/A 01/09/2021    Procedure: Right Heart Catheterization;  Surgeon: Norleen Deward Grumbling, MD;  Location: [facility] CATH;  Service: Cardiology

## 2024-07-04 NOTE — Unmapped (Signed)
 PHYSICAL THERAPY  Evaluation (07/04/24 1347)          Patient Name:  Barbara Huber       Medical Record Number: [id number]   Date of Birth: 07-24-1941  Sex: Female        Post-Discharge Physical Therapy Recommendations:  PT Post Acute Discharge Recommendations: Skilled PT services indicated, 3x weekly, Supervision for all mobility due to fall risk   Equipment Recommendation  PT DME Recommendations: None          Treatment Diagnosis: Abnormalities of gait and mobility, Generalized muscle weakness, Unsteadiness on feet        Activity Tolerance: Limited by fatigue, Tolerated treatment well     ASSESSMENT  Problem List: Decreased mobility, Decreased strength, Decreased range of motion, Impaired balance, Decreased endurance, Gait deviation, Impaired ADLs, Fall risk      Assessment : 'Barbara Huber is a 83 y.o. woman with PMH of pulmonary hypertension, dyspnea with exertion, COPD with chronic hypoxemic respiratory failure on 3 L [place], GERD, stage IV/V [name], [name], persistent atrial fibrillation on amiodarone  who presents with generalized weakness.' Pt/cg reports PLOF as Mod-I for limited household distances with rollator, reports no recent falls, has 24/7 assist at home and uses w/c for community distances. Pt presents to acute PT services near baseline with deficits listed above. Pt performed sit<>stands with MinA progressing to SBA, and ambulation with grossly SBA and RW. Pt requires seated rest breaks throughout session with good safety awareness by self-selecting breaks. Pt will continue to benefit from acute PT services with 3x post-acute needs.      Today's Interventions: PT eval, STS transfers, amb, toileting/pericare, Ed re: PT Role, POC, falls prevention, progressive mobility     Personal Factors/Comorbidities Present: 3+   Examination of Body System: Musculoskeletal  Clinical Presentation: Evolving    Clinical Decision Making: Moderate        PLAN  Planned Frequency of Treatment: Plan of Care Initiated: 07/04/24  1-2x per day Weekly Frequency: 3-4 days per week  Planned Treatment Duration: 07/18/24     Planned Interventions: Education (Patient/Family/Caregiver), Gait training, Home exercise program, Self-care / Home Management training, Therapeutic Exercise, Therapeutic Activity     Goals:   Patient and Family Goals: Return to baseline     SHORT GOAL #1: Pt will perform all functional transfers with SBA and LRAD               Time Frame : 2 weeks  SHORT GOAL #2: Pt will ambulate >51ft with SBA and LRAD              Time Frame : 2 weeks                                         Long Term Goal #1: Pt will improve ambulation to >65ft in one bout prior to needing rest break  Time Frame: 4 weeks     Prognosis:  Good  Positive Indicators: home support, motivation  Barriers to Discharge: None     SUBJECTIVE  Communication Preference: Verbal     Patient reports: Agreeable to PT  Pain Comments: Denies pain     Services patient receives prior to admission: PT, OT  Prior Functional Status: pt's caregiver reports modified independence at home using rollator, sometimes needs assist to stand due to having a lift recliner at home. Limited household ambulator at baseline, mostly  ambulates ~20-30 ft bouts at most. Family assists with ADLs. Wears 3L O2 consistently at home. Uses w/c for community mobility.  Living Situation  Living Environment: House  Lives With: Daughter, Family  Home Living: One level home, Walk-in shower, Grab bars in shower, Built-in shower seat, Shower chair with back, Ramped entrance, Bedside commode, Handicapped height toilet  Caregiver Identified?: Yes  Caregiver Availability: 24 hours  Caregiver Ability: Limited lifting  Caregiver Identified?: Yes   Equipment available at home: Rollator, Bedside commode, Rolling walker        Past Medical History[1]         Social History     Tobacco Use    Smoking status: Former     Current packs/day: 0.00     Types: Cigarettes     Quit date: 2019     Years since quitting: 6.5    Smokeless tobacco: Never    Tobacco comments:     Quit a few years ago   Substance Use Topics    Alcohol use: No       Past Surgical History[2]          Family History[3]     Allergies: Nitrofurantoin and Lipitor  [atorvastatin ]                  Objective Findings  Precautions / Restrictions  Precautions: Falls precautions  Weight Bearing Status: Non-applicable  Required Braces or Orthoses: Non-applicable        Equipment / Environment: Vascular access (PIV, TLC, Port-a-cath, PICC), Supplemental oxygen      Vitals/Orthostatics : VSS, on 3LO2, SpO2 dropped to 87% with ambulation     Cognition: WFL  Cognition comment: alert and conversational  Visual/Perception: Wears Glasses/Contacts all the time  Hearing: No deficit identified     Skin Inspection: Intact where visualized     Upper Extremities  UE ROM: Right Impaired/Limited, Left Impaired/Limited  RUE ROM Impairment: Limited AROM  LUE ROM Impairment: Limited AROM  UE Strength: Right WFL, Left WFL  UE comment: Decreased B shoulder elevation bilaterally >100deg    Lower Extremities  LE ROM: Right WFL, Left WFL  LE Strength: Right WFL, Left WFL  LE comment: Grossly 4-/5          Coordination: WFL  Proprioception: WFL  Sensation: Impaired  Posture: WFL  Motor/Sensory/Neuro Comments: h/o peripheral neuropathy    Static Sitting-Level of Assistance: Independent  Dynamic Sitting-Level of Assistance: Clinical biochemist Standing-Level of Assistance: Stand by assistance  Dynamic Standing - Level of Assistance: Contact guard      Bed Mobility comments: NT- pt in recliner pre and post session     Transfers: Sit to Stand  Sit to Stand assistance level: Minimal assist, patient does 75% or more  Transfer comments: originally MinA progressing to SBA with increased repetitions, with RW      Gait Level of Assistance: Contact guard assist, steadying assist, Standby assist, set-up cues, supervision of patient - no hands on  Gait Distance Ambulated (ft): 10 ft  Skilled Treatment Performed: 10+15+15, Grossly SBA with occasional balance checks requiring CGA. Pt demo's slow, steady reciprocal pattern with decreased step length and foot clearance     Stairs: NA            Endurance: Fair minus    Patient at end of session: All needs in reach, Friends/Family present    Physical Therapy Session Duration  PT Individual [mins]: 31          AM-PAC  5 click  Help currently need turning over In bed?: A Little - Minimal/Contact Guard Assist/Supervision  Help currently needed sitting down/standing up from chair with arms? : A Little - Minimal/Contact Guard Assist/Supervision  Help currently needed moving from supine to sitting on edge of bed?: A Little - Minimal/Contact Guard Assist/Supervision  Help currently needed moving to and from bed from wheelchair?: A Little - Minimal/Contact Guard Assist/Supervision  Help currently needed walking in a hospital room?: A Little - Minimal/Contact Guard Assist/Supervision      Basic Mobility Score 5 click: 15    Score (in points): % of Functional Impairment, Limitation, Restriction  5: 100% impaired, limited, restricted  6-7: At least 80%, but less than 100% impaired, limited restricted  8-11: At least 60%, but less than 80% impaired, limited restricted  12-16: At least 40%, but less than 60% impaired, limited restricted  17-18: At least 20%, but less than 40% impaired, limited restricted  19: At least 1%, but less than 20% impaired, limited restricted  20: 0% impaired, limited restricted    'AM-PAC' forms are Copyright protected by [employer] [facility]         I attest that I have reviewed the above information.  Signed: Camie Blew, PT  Filed 07/04/2024          [1]   Past Medical History:  Diagnosis Date    Acute exacerbation of CHF (congestive heart failure)    08/19/2023    Acute kidney injury superimposed on chronic kidney disease 10/11/2020    Acute on chronic diastolic (congestive) heart failure    08/23/2020    Acute on chronic diastolic congestive heart failure    08/23/2020    AKI (acute kidney injury) 04/14/2015    Lab Results  Component  Value  Date     CREATININE  1.90 (H)  06/12/2021     Had a bump in her creatinine when she was taking her diuretics every day.  She is currently taking 40 mg daily of torsemide  and 50 mg of spironolactone .  Her volume status is fragile.  Previously when she stopped her diuretic she becomes short of breath.  Plan: We will check her BMP today.  We will likely have to go to 40    Anemia 08/22/2019    Iron  deficiency anemia          Lab Results      Component    Value    Date           WBC    9.2    03/21/2021           RBC    3.67 (L)    03/21/2021           HGB    9.9 (L)    03/21/2021           HCT    30.7 (L)    03/21/2021           MCV    83.7    03/21/2021           MCH    26.9    03/21/2021           MCHC    32.1    03/21/2021           RDW    21.9 (H)    03/21/2021           PLT    318  Arthritis     Calculus of kidney     Calculus of ureter     CHF (congestive heart failure)        Chronic atrial fibrillation    07/20/2019    COPD (chronic obstructive pulmonary disease)        Diabetes        Gangrenous cholecystitis 10/11/2020    Generalized edema  06/17/2021    GERD (gastroesophageal reflux disease)     Hydronephrosis     Hypertension     Hyponatremia 10/11/2020    Intermediate coronary syndrome    03/13/2014    Lower extremity edema 09/28/2020    Lumbar stenosis     Microscopic hematuria     Nausea alone     Nephrolithiasis 04/17/2016    Neuropathy     Nocturia     Nocturia 07/01/2017    Other chronic cystitis     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  See plans for anemia, AKI  We are also tapering her gabapentin .  Currently on 300 mg 3 times daily.  We will decrease it to twice daily, and then nightly.  She states she is not having any recurrence of her pain.    Pulmonary hypertension        Renal colic     Shortness of breath 04/28/2020    Sleep apnea Unstable angina pectoris    03/13/2014   [2]   Past Surgical History:  Procedure Laterality Date    BACK SURGERY  1995    CARPAL TUNNEL RELEASE Left 2014    HYSTERECTOMY  1971    IR INSERT CHOLECYSTOSTOMY TUBE PERCUTANEOUS  10/02/2020    IR INSERT CHOLECYSTOSMY TUBE PERCUTANEOUS 10/02/2020 Ivonne Butler Seed, MD IMG VIR [facility]    LUMBAR DISC SURGERY      PR REMOVAL GALLBLADDER N/A 10/06/2020    Procedure: CHOLECYSTECTOMY;  Surgeon: Curtistine Mira Dunnings, MD;  Location: MAIN OR [facility];  Service: Trauma    PR RIGHT HEART CATH O2 SATURATION & CARDIAC OUTPUT N/A 09/30/2020    Procedure: Right Heart Catheterization;  Surgeon: Reyes Jerilynn Sage, MD;  Location: [facility] CATH;  Service: Cardiology    PR RIGHT HEART CATH O2 SATURATION & CARDIAC OUTPUT N/A 01/09/2021    Procedure: Right Heart Catheterization;  Surgeon: Norleen Deward Grumbling, MD;  Location: [facility] CATH;  Service: Cardiology   [3]   Family History  Problem Relation Age of Onset    Hypertension Mother     Cancer Father         COLON CANCER    No Known Problems Sister     No Known Problems Sister     No Known Problems Brother     No Known Problems Brother     No Known Problems Maternal Aunt     No Known Problems Maternal Uncle     No Known Problems Paternal Aunt     No Known Problems Paternal Uncle     No Known Problems Maternal Grandmother     No Known Problems Maternal Grandfather     No Known Problems Paternal Grandmother     No Known Problems Paternal Grandfather     No Known Problems Other     Anesthesia problems Neg Hx     Broken bones Neg Hx     Clotting disorder Neg Hx     Collagen disease Neg Hx     Diabetes Neg Hx     Dislocations  Neg Hx     Fibromyalgia Neg Hx     Gout Neg Hx     Hemophilia Neg Hx     Osteoporosis Neg Hx     Rheumatologic disease Neg Hx     Scoliosis Neg Hx     Severe sprains Neg Hx     Sickle cell anemia Neg Hx     Spinal Compression Fracture Neg Hx     GU problems Neg Hx     Kidney cancer Neg Hx     Prostate cancer Neg Hx

## 2024-07-04 NOTE — Unmapped (Signed)
 Speech Language Pathology Clinical Swallow Assessment  Evaluation (07/04/24 1255)    Patient Name:  Barbara Huber       Medical Record Number: [id number]   Date of Birth: [birth year]  Sex: Female            SLP Treatment Diagnosis: Dysphagia- Oropharyngeal     Activity Tolerance: Patient tolerated treatment well    Assessment     Risk for Aspiration: Mild     Recommendations:  Frequent Oral Care           Diet Liquids Recommendations: Thin Liquids, Level 0    Diet Solids Recommendation: Regular Consistency Solids    Recommended Form of Medications: Whole, With liquid      Recommended Compensatory Techniques : Upright 90 degrees    Post Acute Discharge Recommendations  Post Acute SLP Discharge Recommendations: Skilled SLP services are NOT indicated    Prognosis: Good  Positive Indicators: family support, participation, current status  Barriers to Discharge: None     Plan of Care  SLP Daily Frequency: D/C Services, D/C Services       Treatment Goals:        Patient and Family Goal: None stated    Subjective  Medical Updates Since Last Visit/Relevant PMH Affecting Clinical Decision Making: 83 y.o. woman with PMH of COPD with chronic hypoxemic respiratory failure on 3 L [place], GERD,  who presents with generalized weakness, productive cough following recent admission for COPD exacerbation.                     Communication Preference: Verbal     Pain: no s/s of pain           Allergies: Nitrofurantoin and Lipitor  [atorvastatin ]  Current Medications[1]  Past Medical History[2]  Family History[3]  Past Surgical History[4]  Social History     Tobacco Use    Smoking status: Former     Current packs/day: 0.00     Types: Cigarettes     Quit date: 2019     Years since quitting: 6.5    Smokeless tobacco: Never    Tobacco comments:     Quit a few years ago   Substance Use Topics    Alcohol use: No         General:                    Self-Feeding Capacity: Functional for self-feeding                         Services patient receives prior to admission: PT, OT    Precautions / Restrictions  Precautions: Falls precautions  Weight Bearing Status: Non-applicable  Required Braces or Orthoses: Non-applicable    Objective  Temperature Spikes Noted: No  Respiratory Status : O2 via nasal cannula (3L of O2 via [place] which is her baseline)  History of Intubation: No          Behavior/Cognition: Alert, Cooperative, Pleasant mood  Positioning : Upright in chair    Oral / Motor Exam  Vocal Quality: Normal  Volitional Swallow: Within Functional Limits   Labial ROM: Within Functional Limits   Labial Symmetry: Within Functional Limits  Labial Strength: Within Functional Limits   Lingual ROM: Within Functional Limits  Lingual Symmetry: Within Functional Limits  Lingual Strength: Within Functional Limits      Velum: elevation grossly WNL         Facial ROM: Within Functional  Limits   Facial Symmetry: Within Functional Limits  Facial Strength: Within Functional Limits      Vocal Intensity: Within Functional Limits           Dysarthria: None present   Intelligibility: Intelligible   Breath Support: Adequate for speech   Dentition: Adequate    Consistencies assessed: thin, regular solid    Patient at end of session: All needs in reach, Friends/Family present, In chair         Speech Therapy Session Duration  SLP Individual [mins]: 12    I attest that I have reviewed the above information.  Signed: Therisa Aris, SLP    Filed 07/04/2024         [1]   Current Facility-Administered Medications   Medication Dose Route Frequency Provider Last Rate Last Admin    acetaminophen  (TYLENOL ) tablet 1,000 mg  1,000 mg Oral TID Unice Almarie Caldron, MD   1,000 mg at 07/04/24 1355    amiodarone  (PACERONE ) tablet 200 mg  200 mg Oral Daily Unice Almarie Caldron, MD   200 mg at 07/04/24 1000    apixaban  (ELIQUIS ) tablet 2.5 mg  2.5 mg Oral BID Unice Almarie Caldron, MD   2.5 mg at 07/04/24 1000    calcium  carbonate (TUMS) chewable tablet 400 mg elem calcium   400 mg elem calcium  Oral TID PRN Unice Almarie Caldron, MD        cyclobenzaprine  (FLEXERIL ) tablet 10 mg  10 mg Oral Nightly Unice Almarie Caldron, MD   10 mg at 07/03/24 2048    [START ON 07/05/2024] ferrous sulfate  tablet 325 mg  325 mg Oral Every other day Unice Almarie Caldron, MD        fluticasone  furoate-vilanterol (BREO ELLIPTA ) 100-25 mcg/dose inhaler 1 puff  1 puff Inhalation Daily (RT) Unice Almarie Caldron, MD   1 puff at 07/04/24 0852    [START ON 07/07/2024] fosfomycin (MONUROL ) packet 3 g  3 g Oral Weekly Unice Almarie Caldron, MD        gabapentin  (NEURONTIN ) capsule 100 mg  100 mg Oral BID Unice Almarie Caldron, MD   100 mg at 07/04/24 1000    guaiFENesin (ROBITUSSIN) oral syrup  200 mg Oral Q4H PRN Unice Almarie Caldron, MD        ipratropium-albuterol  (DUO-NEB) 0.5-2.5 mg/3 mL nebulizer solution 3 mL  3 mL Nebulization 4x Daily (RT) Unice Almarie Caldron, MD   3 mL at 07/04/24 1233    lidocaine  (ASPERCREME) 4 % 1 patch  1 patch Transdermal Daily Unice Almarie Caldron, MD   1 patch at 07/03/24 1641    melatonin tablet 3 mg  3 mg Oral Nightly PRN Unice Almarie Caldron, MD        midodrine  (PROAMATINE ) tablet 5 mg  5 mg Oral TID Unice Almarie Caldron, MD   5 mg at 07/04/24 1355    nystatin  (MYCOSTATIN ) powder   Topical TID PRN Unice Almarie Caldron, MD        oxyCODONE  (ROXICODONE ) immediate release tablet 5 mg  5 mg Oral BID Unice Almarie Caldron, MD   5 mg at 07/04/24 1000    oxyCODONE  (ROXICODONE ) immediate release tablet 5 mg  5 mg Oral Q6H PRN Meryle Greig BRAVO, MD   5 mg at 07/04/24 0335    pantoprazole  (Protonix ) EC tablet 20 mg  20 mg Oral Daily Unice Almarie Caldron, MD   20 mg at 07/04/24 1010    polyethylene glycol (MIRALAX ) packet 17 g  17 g Oral Daily  Unice Almarie Caldron, MD   17 g at 07/04/24 1000    polyethylene glycol (MIRALAX ) packet 17 g  17 g Oral Nightly PRN Unice Almarie Caldron, MD   17 g at 07/03/24 2052    sodium chloride  3 % NEBULIZER solution 4 mL  4 mL Nebulization 4x Daily (RT) Unice Almarie Caldron, MD   4 mL at 07/04/24 1236    tafamidis  cap 61 mg **PATIENT SUPPLIED**  61 mg Oral Daily Unice Almarie Caldron, MD   61 mg at 07/04/24 1000   [2]   Past Medical History:  Diagnosis Date    Acute exacerbation of CHF (congestive heart failure)    08/19/2023    Acute kidney injury superimposed on chronic kidney disease 10/11/2020    Acute on chronic diastolic (congestive) heart failure    08/23/2020    Acute on chronic diastolic congestive heart failure    08/23/2020    AKI (acute kidney injury) 04/14/2015    Lab Results  Component  Value  Date     CREATININE  1.90 (H)  06/12/2021     Had a bump in her creatinine when she was taking her diuretics every day.  She is currently taking 40 mg daily of torsemide  and 50 mg of spironolactone .  Her volume status is fragile.  Previously when she stopped her diuretic she becomes short of breath.  Plan: We will check her BMP today.  We will likely have to go to 40    Anemia 08/22/2019    Iron  deficiency anemia          Lab Results      Component    Value    Date           WBC    9.2    03/21/2021           RBC    3.67 (L)    03/21/2021           HGB    9.9 (L)    03/21/2021           HCT    30.7 (L)    03/21/2021           MCV    83.7    03/21/2021           MCH    26.9    03/21/2021           MCHC    32.1    03/21/2021           RDW    21.9 (H)    03/21/2021           PLT    318     Arthritis     Calculus of kidney     Calculus of ureter     CHF (congestive heart failure)        Chronic atrial fibrillation    07/20/2019    COPD (chronic obstructive pulmonary disease)        Diabetes        Gangrenous cholecystitis 10/11/2020    Generalized edema  06/17/2021    GERD (gastroesophageal reflux disease)     Hydronephrosis     Hypertension     Hyponatremia 10/11/2020    Intermediate coronary syndrome    03/13/2014    Lower extremity edema 09/28/2020    Lumbar stenosis     Microscopic hematuria     Nausea alone     Nephrolithiasis 04/17/2016  Neuropathy     Nocturia     Nocturia 07/01/2017    Other chronic cystitis     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  See plans for anemia, AKI  We are also tapering her gabapentin .  Currently on 300 mg 3 times daily.  We will decrease it to twice daily, and then nightly.  She states she is not having any recurrence of her pain.    Pulmonary hypertension        Renal colic     Shortness of breath 04/28/2020    Sleep apnea     Unstable angina pectoris    03/13/2014   [3]   Family History  Problem Relation Age of Onset    Hypertension Mother     Cancer Father         COLON CANCER    No Known Problems Sister     No Known Problems Sister     No Known Problems Brother     No Known Problems Brother     No Known Problems Maternal Aunt     No Known Problems Maternal Uncle     No Known Problems Paternal Aunt     No Known Problems Paternal Uncle     No Known Problems Maternal Grandmother     No Known Problems Maternal Grandfather     No Known Problems Paternal Grandmother     No Known Problems Paternal Grandfather     No Known Problems Other     Anesthesia problems Neg Hx     Broken bones Neg Hx     Clotting disorder Neg Hx     Collagen disease Neg Hx     Diabetes Neg Hx     Dislocations Neg Hx     Fibromyalgia Neg Hx     Gout Neg Hx     Hemophilia Neg Hx     Osteoporosis Neg Hx     Rheumatologic disease Neg Hx     Scoliosis Neg Hx     Severe sprains Neg Hx     Sickle cell anemia Neg Hx     Spinal Compression Fracture Neg Hx     GU problems Neg Hx     Kidney cancer Neg Hx     Prostate cancer Neg Hx    [4]   Past Surgical History:  Procedure Laterality Date    BACK SURGERY  1995    CARPAL TUNNEL RELEASE Left 2014    HYSTERECTOMY  1971    IR INSERT CHOLECYSTOSTOMY TUBE PERCUTANEOUS  10/02/2020    IR INSERT CHOLECYSTOSMY TUBE PERCUTANEOUS 10/02/2020 Ivonne Butler Seed, MD IMG [facility]    LUMBAR DISC SURGERY      PR REMOVAL GALLBLADDER N/A 10/06/2020    Procedure: CHOLECYSTECTOMY;  Surgeon: Curtistine Mira Dunnings, MD;  Location: MAIN OR [facility]; Service: Trauma    PR RIGHT HEART CATH O2 SATURATION & CARDIAC OUTPUT N/A 09/30/2020    Procedure: Right Heart Catheterization;  Surgeon: Reyes Jerilynn Sage, MD;  Location: [facility] CATH;  Service: Cardiology    PR RIGHT HEART CATH O2 SATURATION & CARDIAC OUTPUT N/A 01/09/2021    Procedure: Right Heart Catheterization;  Surgeon: Norleen Deward Grumbling, MD;  Location: [facility] CATH;  Service: Cardiology

## 2024-07-04 NOTE — Unmapped (Signed)
 Shift Summary  Oxycodone  was administered due to increased pain level.    Polyethylene glycol was given for constipation, however pt called frequently for the bedpan overnight and had several small liquid brown stools. Pt daughter reports pt has similar bowel habits at home.     Respiratory treatment was performed and tolerated well.    Telemetry monitoring showed normal sinus rhythm with occasional ectopy.    Overall, the patient was monitored closely for fall risk and skin integrity, with interventions in place to manage pain and respiratory status.     Absence of Fall and Fall-Related Injury: Hourly visual checks were performed consistently, and the patient was awake or resting in bed. Safety devices were used for transfers, and the room door was kept closed to decrease stimulation.     Skin Health and Integrity: Skin integrity showed bruising and redness, with thin epidermis and loss of subcutaneous tissue. Positioning was adjusted frequently to reduce pressure, and pressure reduction devices were utilized.

## 2024-07-04 NOTE — Unmapped (Signed)
 OCCUPATIONAL THERAPY  Evaluation (07/04/24 0909)    Patient Name:  Barbara Huber       Medical Record Number: [id number]   Date of Birth: [birth year]  Sex: Female      Post-Discharge Occupational Therapy Recommendations: 3x weekly (with 24/7 assist)          Equipment Recommendation  OT DME Recommendations: None       OT Treatment Diagnosis: Need for assistance with personal care         Assessment  Problem List: Decreased activity tolerance, Decreased endurance, Fall risk, Decreased strength, Decreased mobility, Impaired ADLs, Gait deviation, Decreased safety awareness, Impaired judgement, Impaired balance, Impaired sensation  Personal Factors/Comorbidities (Occupational Profile and History Review): Expanded (Moderate)  Assessment of Occupational Performance : Balance, Endurance, Fine or gross motor coordination, Mobility, Sensation, Strength  Clinical Decision Making: Moderate Complexity  Assessment: Barbara Huber is a 83 y.o. woman with PMH of pulmonary hypertension, dyspnea with exertion, COPD with chronic hypoxemic respiratory failure on 3 L [place], GERD, stage IV/V [name], [name], persistent atrial fibrillation on amiodarone  who presents with generalized weakness. Pt agreeable to OT session. Daughter present during today's session. Pt reports at baseline she requires assistance from daughter PRN and uses rollator for functional mobility. Pt daughter reports having careigver to be with pt during the day while she is at work. Pt reports no recent falls.  Pt educated on role of OT, OT POC, and safety with functional transfers. Pt is functioning below baseline requiring minA for BSC transfer using RW, TotalA for peri- care in sitting, totalA for don/doff socks,functional mobility x4 side steps x2 trials with minA using RW, and CGA for functional transfer sit<>stand using RW. Pt noted with 1 LOB 2/2 buckling of knees and required mod A for safe descent. Pt returned to sitting on BSC>Recliner chair.Therapist educated pt on hand placement during functional transfers. Pt verbalized understanding. Pt required increased time for toileting during today's session. Pt demonstrates deficits including decreased balance, decreased strength, decreased endurance, and decreased safety awareness limiting independence. Pt will benefit from skilled acute care OT to address these deficits and maximize independence. Recommend 3x with 24/7 assist at post acute DC. Pt presents to be a moderate complexity case. AM-PAC 15/24 demonstrating 56.46% impaired care.  Today's Interventions: ADL retraining, Balance activities, Compensatory tech. training, Conservation, Education - Patient, Functional mobility, Endurance activities, Insurance account manager / Proximal stability, Safety education, Teacher, early years/pre, Education - Family / caregiver  Today's Interventions: Transfer training, functional mobility, Toileting and toileting tasks, Safety education, Balance activities    Activity Tolerance During Today's Session  Tolerated treatment well    Plan  Planned Frequency of Treatment: Plan of Care Initiated: 07/04/24  1-2x per day Weekly Frequency: 3-4 days per week  Planned Treatment Duration: 07/11/24    Planned Interventions:  Education (Patient/Family/Caregiver), Home Exercise Program, Therapeutic Activity, Therapeutic Exercise, Self-Care/Home Training      GOALS:   Patient and Family Goals: none stated    Short Term:   SHORT GOAL #1: Pt will complete 2+ grooming tasks in unsupported sitting with SBA   Time Frame : 1 week  SHORT GOAL #2: Pt will complete UE ADL in unsupported sitting with SBA.      SHORT GOAL #3: Pt will complete toilet transfer with supervision and toileting tasks with minA using LRAD   Time Frame : 1 week           Long Term Goal #1: Pt will maximize independence with ADLs and ADL transfers within  8 weeks.  Time Frame: 4 weeks    Prognosis:  Good  Positive Indicators:  Family and caregiver support  Barriers to Discharge: Endurance deficits, Functional strength deficits, Gait instability, Impaired Balance, Pain    Subjective  Medical Updates Since Last Visit/Relevant PMH Affecting Clinical Decision Making:    Prior Functional Status Pt reports at baseline her daughter assists with ADLs and she uses rollator for functional mobility. Pt lives with daughter and son-in law who work but hired a caregiver to be there during the day.    Living Situation  Living Environment: House  Lives With: Daughter, Family  Home Living: One level home, Walk-in shower, Grab bars in shower, Built-in shower seat, Shower chair with back, Ramped entrance, Bedside commode, Handicapped height toilet  Caregiver Identified?: Yes  Caregiver Availability: Intermittent  Caregiver Ability: Nurse, adult available at home: Rollator, Bedside commode, Rolling walker       Services patient receives prior to admission: OT, PT    Patient / Caregiver reports: Thank you Pt agreeable to OT services.    Past Medical History[1] Social History     Tobacco Use    Smoking status: Former     Current packs/day: 0.00     Types: Cigarettes     Quit date: 2019     Years since quitting: 6.5    Smokeless tobacco: Never    Tobacco comments:     Quit a few years ago   Substance Use Topics    Alcohol use: No      Past Surgical History[2] Family History[3]     Nitrofurantoin and Lipitor  [atorvastatin ]     Objective Findings  Precautions / Restrictions  Falls precautions       Weight Bearing  Non-applicable    Required Braces or Orthoses  Non-applicable    Communication Preference  Verbal       Pain  Pt reports no pain    Equipment / Environment  Vascular access (PIV, TLC, Port-a-cath, PICC), Supplemental oxygen      Cognition   Orientation Level:  Oriented x 4   Arousal/Alertness:  Appropriate responses to stimuli   Attention Span:  Appears intact   Memory:  Appears intact   Following Commands:  Follows multistep commands with increased time   Safety Judgment:  Good awareness of safety precautions   Awareness of Errors and Problem Solving:  Patient self-corrected errors   Comments:      Vision / Hearing   Vision: Wears glasses all the time, No acute deficits identified     Hearing: No deficit identified         Hand Function:  Right Hand Function: Right hand function impaired  Right Hand Impairment: chronic tremor, grip strength poor  Left Hand Function: Left hand function impaired  Left Hand Impairment: chronic tremor, grip strength poor  Hand Dominance: Right    Skin Inspection:  Skin Inspection: Intact where visualized    Face/Cervical ROM:  Face ROM: WFL  Cervical ROM: WFL    ROM / Strength:  UE ROM/Strength: Left Impaired/Limited, Right Impaired/Limited  RUE Impairment: Limited AROM, Reduced strength  LUE Impairment: Limited AROM, Reduced strength  LE ROM/Strength: Right Impaired/Limited, Left Impaired/Limited  RLE Impairment: Reduced strength  LLE Impairment: Reduced strength    Coordination:  Coordination: WFL    Sensation:  RUE Sensation: RUE impaired  RUE Sensation Impairment: chronic peripheral neuropathy  LUE Sensation: LUE impaired  LUE Sensation Impairment: chronic peripheral neuropathy  RLE Sensation: RLE  impaired  RLE Sensation Impairment: chronic peripheral neuropathy  LLE Sensation: LLE impaired  LLE Sensation Impairment: chronic peripheral neuropathy    Balance:  Static Sitting-Level of Assistance: Contact guard  Dynamic Sitting-Level of Assistance: Minimum assistance  Sitting Balance comments: unsupported sitting in recliner chair    Static Standing-Level of Assistance: Minimum assistance  Dynamic Standing - Level of Assistance: Moderate assistance  Standing Balance comments: using RW    Functional Mobility  Transfers: Min assist (sit>stand t/f and BSC t/f)  Bed Mobility - Needs Assistance: Min assist (anticipated)  Ambulation: Pt completed functional mobility x4 side steps x2 trials with minA using RW    ADLs  ADLs: Needs assistance with ADLs  ADLs - Needs Assistance: LB dressing, Toileting, UB dressing, Grooming  Grooming - Needs Assistance: Min assist, Performed seated (anticipated)  Toileting - Needs Assistance: Max assist (totalA for peri care.)  UB Dressing - Needs Assistance: Min assist (anticipated 2/2 decreased UE ROM)  LB Dressing - Needs Assistance: Performed seated, Total Assist (pt required totalA for don/doff socks.)    Vitals / Orthostatics  Vitals/Orthostatics: O2 remained 100% on 3L [place]. Therapist decreased O2 to 2.5 L [place] and pt remained above 95%    Patient at end of session: All needs in reach, Friends/Family present, In chair, Lines intact, Notified Nurse     Occupational Therapy Session Duration  OT Individual [mins]: 40      AM-PAC-Daily Activity  Lower Body Dressing assistance needs: A lot - Maximum/Moderate Assistance  Bathing assistance needs: A lot - Maximum/Moderate Assistance  Toileting assistance needs: A lot - Maximum/Moderate Assistance  Upper Body Dressing assistance needs: A Little - Minimal/Contact Guard Assist/Supervision  Personal Grooming assistance needs: A Little - Minimal/Contact Guard Assist/Supervision  Eating Meals assistance needs: None - Modified Independent/Independent    Daily Activity Score: 16    Score (in points): % of Functional Impairment, Limitation, Restriction  6: 100% impaired, limited, restricted  7-8: At least 80%, but less than 100% impaired, limited restricted  9-13: At least 60%, but less than 80% impaired, limited restricted  14-19: At least 40%, but less than 60% impaired, limited restricted  20-22: At least 20%, but less than 40% impaired, limited restricted  23: At least 1%, but less than 20% impaired, limited restricted  24: 0% impaired, limited restricted    A student was present and participated in the care. Licensed/Credentialed therapist was physically present and immediately available to direct and supervise tasks that were related to patient management. The direction and supervision was continuous throughout the time these tasks were performed.     I attest that I have reviewed the above information.  Signed: Gerard FORBES Lunger, OT  Filed 07/04/2024               [1]   Past Medical History:  Diagnosis Date    Acute exacerbation of CHF (congestive heart failure)    08/19/2023    Acute kidney injury superimposed on chronic kidney disease 10/11/2020    Acute on chronic diastolic (congestive) heart failure    08/23/2020    Acute on chronic diastolic congestive heart failure    08/23/2020    AKI (acute kidney injury) 04/14/2015    Lab Results  Component  Value  Date     CREATININE  1.90 (H)  06/12/2021     Had a bump in her creatinine when she was taking her diuretics every day.  She is currently taking 40 mg daily of torsemide  and 50  mg of spironolactone .  Her volume status is fragile.  Previously when she stopped her diuretic she becomes short of breath.  Plan: We will check her BMP today.  We will likely have to go to 40    Anemia 08/22/2019    Iron  deficiency anemia          Lab Results      Component    Value    Date           WBC    9.2    03/21/2021           RBC    3.67 (L)    03/21/2021           HGB    9.9 (L)    03/21/2021           HCT    30.7 (L)    03/21/2021           MCV    83.7    03/21/2021           MCH    26.9    03/21/2021           MCHC    32.1    03/21/2021           RDW    21.9 (H)    03/21/2021           PLT    318     Arthritis     Calculus of kidney     Calculus of ureter     CHF (congestive heart failure)        Chronic atrial fibrillation    07/20/2019    COPD (chronic obstructive pulmonary disease)        Diabetes        Gangrenous cholecystitis 10/11/2020    Generalized edema  06/17/2021    GERD (gastroesophageal reflux disease)     Hydronephrosis     Hypertension     Hyponatremia 10/11/2020    Intermediate coronary syndrome    03/13/2014    Lower extremity edema 09/28/2020    Lumbar stenosis     Microscopic hematuria     Nausea alone     Nephrolithiasis 04/17/2016    Neuropathy     Nocturia     Nocturia 07/01/2017    Other chronic cystitis     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  See plans for anemia, AKI  We are also tapering her gabapentin .  Currently on 300 mg 3 times daily.  We will decrease it to twice daily, and then nightly.  She states she is not having any recurrence of her pain.    Pulmonary hypertension        Renal colic     Shortness of breath 04/28/2020    Sleep apnea     Unstable angina pectoris    03/13/2014   [2]   Past Surgical History:  Procedure Laterality Date    BACK SURGERY  1995    CARPAL TUNNEL RELEASE Left 2014    HYSTERECTOMY  1971    IR INSERT CHOLECYSTOSTOMY TUBE PERCUTANEOUS  10/02/2020    IR INSERT CHOLECYSTOSMY TUBE PERCUTANEOUS 10/02/2020 Ivonne Butler Seed, MD IMG [facility]    LUMBAR DISC SURGERY      PR REMOVAL GALLBLADDER N/A 10/06/2020    Procedure: CHOLECYSTECTOMY;  Surgeon: Curtistine Mira Dunnings, MD;  Location: MAIN OR [facility];  Service: Trauma    PR  RIGHT HEART CATH O2 SATURATION & CARDIAC OUTPUT N/A 09/30/2020    Procedure: Right Heart Catheterization;  Surgeon: Reyes Jerilynn Sage, MD;  Location: [facility] CATH;  Service: Cardiology    PR RIGHT HEART CATH O2 SATURATION & CARDIAC OUTPUT N/A 01/09/2021    Procedure: Right Heart Catheterization;  Surgeon: Norleen Deward Grumbling, MD;  Location: [facility] CATH;  Service: Cardiology   [3]   Family History  Problem Relation Age of Onset    Hypertension Mother     Cancer Father         COLON CANCER    No Known Problems Sister     No Known Problems Sister     No Known Problems Brother     No Known Problems Brother     No Known Problems Maternal Aunt     No Known Problems Maternal Uncle     No Known Problems Paternal Aunt     No Known Problems Paternal Uncle     No Known Problems Maternal Grandmother     No Known Problems Maternal Grandfather     No Known Problems Paternal Grandmother     No Known Problems Paternal Grandfather     No Known Problems Other     Anesthesia problems Neg Hx     Broken bones Neg Hx     Clotting disorder Neg Hx     Collagen disease Neg Hx     Diabetes Neg Hx     Dislocations Neg Hx     Fibromyalgia Neg Hx     Gout Neg Hx     Hemophilia Neg Hx     Osteoporosis Neg Hx     Rheumatologic disease Neg Hx     Scoliosis Neg Hx     Severe sprains Neg Hx     Sickle cell anemia Neg Hx     Spinal Compression Fracture Neg Hx     GU problems Neg Hx     Kidney cancer Neg Hx     Prostate cancer Neg Hx

## 2024-07-05 NOTE — Unmapped (Signed)
 Physician Discharge Summary    Discharge date: 07/05/2024     Discharge Service: Med Undesignated (MDX)    Discharge Attending Physician: Elsie Curtistine Couch, MD    Discharge to: Home with Home Health and/or PT/OT (already in place)    Discharge Diagnoses:  AKI on [name]  Dehydration  Pyuria  Chronic hypox resp failure  COPD wo exacerbation  Chronic [name] wo exacerbation    Outpatient Provider Follow Up Issues:   [ ]  PCP and cards to continue to monitor fluid status and adjust diuretics as needed. Probably recheck labs at hospital follow-up.       Pertinent Test Results: labs: most recent pasted here    Lab Results   Component Value Date    WBC 7.8 07/04/2024    HGB 9.1 (L) 07/04/2024    HCT 27.4 (L) 07/04/2024    PLT 150 07/04/2024       Lab Results   Component Value Date    NA 140 07/04/2024    K 3.5 07/04/2024    CL 94 (L) 07/04/2024    CO2 31.8 (H) 07/04/2024    BUN 101 (H) 07/04/2024    CREATININE 2.53 (H) 07/04/2024    GLU 85 07/04/2024    CALCIUM  8.9 07/04/2024    MG 2.6 07/03/2024    PHOS 4.6 07/03/2024       Lab Results   Component Value Date    BILITOT 0.7 07/03/2024    BILIDIR 0.70 (H) 09/29/2023    PROT 6.7 07/03/2024    ALBUMIN 2.9 (L) 07/03/2024    ALT 9 (L) 07/03/2024    AST 32 07/03/2024    ALKPHOS 129 (H) 07/03/2024    GGT 215 (H) 09/29/2020       Lab Results   Component Value Date    PT 19.1 (H) 07/03/2024    INR 1.68 07/03/2024    APTT 42.4 (H) 07/03/2024       CXR  Impression      *  Mild interstitial thickening could be due to mild edema or infectious/inflammatory airways disease.   *  Query small bilateral pleural effusions.       Hospital Course:      Rechy Berni is a 83 y.o. woman with PMH of pulmonary hypertension, dyspnea with exertion, COPD with chronic hypoxemic respiratory failure on 3 L [place], GERD, stage IV/V [name], [name], persistent atrial fibrillation on amiodarone  who presented with generalized weakness.      At baseline she ambulates with a walker but on admission was requiring moderate assist to get to the bedside commode. She has had involuntary jerking of extremities as well. Vitals stable with hypoxemia at baseline saturating 98% on 3 L [place]. VBG showed no acidosis or alkalosis. Labs also significant for no leukocytosis, no significant electrolyte abnormalities, and normal liver enzymes and T bili. Cr 2.86 up from baseline 1.7-2.3 consistent with mild AKI; BUN elevated to 89. Previously TSH 16.9 on 06/08/24 with normal free T4. Of note, patient's weight was 164 lbs on 7/6 prior to admission down from 171 lbs on her home scale when she left the hospital, which suggests she is not fluid up at this time. She had recently had her diuretics increased for concern for fluid overload (stopped 2 days prior to admission). Appeared dry on exam with no appreciable edema. Strongest suspicion was that her weakness and shakiness was due to AKI and dehydration +/- uremia in the setting of overdiuresis. She received 250 ml IV fluid bolus and  40 IV lasix  while in the ED; Gave additional 500 ml IV fluids. Held her home diuretics. Torsemide  80 mg daily and Spironolactone  12.5 mg daily. She very quickly felt improved and essentially back to her baseline. Was able to tolerate PO, ambulate in the room, work w/ PT. Not BUN still around 100; iron  deficiency has been present for years, on PO iron . Hemoglobin ~9 but stable w/o signs of bleeding. Overall reassured how quickly she improved that there isn't some other underlying etiology for her symptoms - infection, bleed, etc. Encouraged close PCP followup, continuing to monitor daily weights, resuming prior dose home diuretics tomorrow but TBD if this will continue to be the right dose for her long term.      Patient was recently admitted 6/16-6/24 for COPD exacerbation with persistent wet cough and improved with antibiotics, airway clearance, and supportive care. Since then it sounds like the cough has waxed and waned a bit. Patient does not noticeably choke when eating or drinking. Suspect lingering cough is post-viral and/or her chronic COPD, as she did not endorse any significant respiratory symptoms and quickly improved to baseline. Covered with antibiotics briefly as below but stopped given low suspicion for infection. Remained on her home oxygen  3L. And provided airway clearance with metaneb Duonebs QID & HTS QID (cont aerobika at home). Can pursue MBSS as an outpatient.      UA showed 21 WBCs, 17 RBCs and 23 hyaline casts, large LE, but negative nitrite. Patient is asymptomatic with no dysuria, urinary urgency, incontinence, urinary retention or increased urianary frequency. Started on Unasyn  on admission for possible UTI, but patient able to clearly deny UTI symptoms, and improved faster than would be expected if her weakness was from a UTI. Continues on fosfomycin weekly to prevent UTI     Cr 2.86 up from baseline 1.7-2.3 with elevated BUN to 89. Given hyaline casts on UA, most likely due to dehydration. Improved to 2.5 with hydration.      Otherwise continued her home meds for chronic conditions, without changes.      Diet  Your Medication List          PAUSE taking these medications       metOLazone  5 MG tablet  Wait to take this until your doctor or other care provider tells you to start again.  Commonly known as: ZAROXOLYN   Take 1 tablet (5 mg total) by mouth daily as needed (up to 3x weekly when instructed by cardiology clinic).                STOP taking these medications       amoxicillin -clavulanate 500-125 mg per tablet  Commonly known as: AUGMENTIN       azithromycin  250 MG tablet  Commonly known as: ZITHROMAX  Z-PAK                CHANGE how you take these medications       albuterol  90 mcg/actuation inhaler  Commonly known as: PROVENTIL  HFA;VENTOLIN  HFA  Inhale 1 puff every six (6) hours as needed for wheezing.  What changed: Another medication with the same name was removed. Continue taking this medication, and follow the directions you see here. CONTINUE taking these medications       ACCU-CHEK GUIDE TEST STRIPS Strp  Generic drug: blood sugar diagnostic  Use to check blood sugar daily      ACCU-CHEK SOFTCLIX LANCETS lancets  Generic drug: lancets  CHECK BLOOD SUGAR DAILY      acetaminophen   500 MG tablet  Commonly known as: TYLENOL   Take 2 tablets (1,000 mg total) by mouth Three (3) times a day as needed for pain.      amiodarone  200 MG tablet  Commonly known as: PACERONE   Take 1 tablet (200 mg total) by mouth daily.      AMVUTTRA  25 mg/0.5 mL injection  Generic drug: vutrisiran   Inject 0.5 mL (25 mg total) under the skin once. Every 12 Weeks      apixaban  2.5 mg Tab  Commonly known as: ELIQUIS   Take 1 tablet (2.5 mg total) by mouth two (2) times a day.      benzonatate  200 MG capsule  Commonly known as: TESSALON   Take 1 capsule (200 mg total) by mouth Three (3) times a day as needed for cough for up to 7 days.      cranberry 500 mg Cap  Take 500 mg by mouth daily with evening meal.      cyclobenzaprine  10 MG tablet  Commonly known as: FLEXERIL   Take 1 tablet (10 mg total) by mouth two (2) times a day as needed for muscle spasms.      ergocalciferol -1,250 mcg (50,000 unit) 1,250 mcg (50,000 unit) capsule  Commonly known as: DRISDOL   TAKE 1 CAPSULE ONCE A WEEK      fluticasone  propion-salmeterol 115-21 mcg/actuation inhaler  Commonly known as: ADVAIR  HFA  Inhale 2 puffs two (2) times a day.      fosfomycin 3 gram Pack  Commonly known as: MONUROL   Take 3 g by mouth once a week.      gabapentin  100 MG capsule  Commonly known as: NEURONTIN   Take 1 capsule (100 mg total) by mouth two (2) times a day.      LIDOCAINE  PAIN RELIEF  4 % patch  Generic drug: lidocaine   Place 1 patch on the skin daily. Remove after 12 hours.      midodrine  5 MG tablet  Commonly known as: PROAMATINE   Take 1 tablet (5 mg total) by mouth three (3) times a day.      NARCAN 4 mg/actuation nasal spray  Generic drug: naloxone  1 spray into alternating nostrils once as needed (opioid overdose). PRN - Emergency use.      nystatin  100,000 unit/gram powder  Commonly known as: MYCOSTATIN   Apply to affected area 3 times daily      oxyCODONE  5 MG immediate release tablet  Commonly known as: ROXICODONE   Take 1 tablet (5 mg total) by mouth every eight (8) hours as needed for pain.      OXYGEN -AIR DELIVERY SYSTEMS MISC  5 L by Miscellaneous route. Currently using 3  L/min via [place]      pantoprazole  20 MG tablet  Commonly known as: Protonix   TAKE 1 TABLET(20 MG) BY MOUTH DAILY      rosuvastatin  5 MG tablet  Commonly known as: CRESTOR   TAKE 1 TABLET EVERY OTHER DAY      SPIRIVA  RESPIMAT 2.5 mcg/actuation inhalation mist  Generic drug: tiotropium bromide   Inhale 2 puffs daily.      spironolactone  25 MG tablet  Commonly known as: ALDACTONE   Take 0.5 tablets (12.5 mg total) by mouth daily.      torsemide  20 MG tablet  Commonly known as: DEMADEX   Take 4 tablets (80 mg total) by mouth daily.      VYNDAMAX  61 mg Cap  Generic drug: tafamidis   Take 1 capsule (61 mg) by mouth daily.  ASK your doctor about these medications       traZODone  50 MG tablet  Commonly known as: DESYREL   Take 1 tablet (50 mg total) by mouth nightly as needed for sleep.        Condition at Discharge: good  Discharge Medications:      Your Medication List        STOP taking these medications      metOLazone  5 MG tablet  Commonly known as: ZAROXOLYN             CHANGE how you take these medications      ergocalciferol -1,250 mcg (50,000 unit) 1,250 mcg (50,000 unit) capsule  Commonly known as: DRISDOL   TAKE 1 CAPSULE ONCE A WEEK  What changed: See the new instructions.     torsemide  20 MG tablet  Commonly known as: DEMADEX   Take 4 tablets (80 mg total) by mouth daily.  What changed: additional instructions            CONTINUE taking these medications      ACCU-CHEK GUIDE TEST STRIPS Strp  Generic drug: blood sugar diagnostic  Use to check blood sugar daily     ACCU-CHEK SOFTCLIX LANCETS lancets  Generic drug: lancets  CHECK BLOOD SUGAR DAILY     acetaminophen  500 MG tablet  Commonly known as: TYLENOL   Take 2 tablets (1,000 mg total) by mouth Three (3) times a day as needed for pain.     ADVAIR  HFA 115-21 mcg/actuation inhaler  Generic drug: fluticasone  propion-salmeterol  INHALE 2 PUFFS TWICE A DAY     AEROCHAMBER MV inhaler  Generic drug: inhalational spacing device  1 spacer for inhaler     albuterol  90 mcg/actuation inhaler  Commonly known as: PROVENTIL  HFA;VENTOLIN  HFA  Inhale 1 puff every six (6) hours as needed for wheezing.     amiodarone  200 MG tablet  Commonly known as: PACERONE   Take 1 tablet (200 mg total) by mouth daily.     AMVUTTRA  25 mg/0.5 mL injection  Generic drug: vutrisiran   Inject 0.5 mL (25 mg total) under the skin once. Every 12 Weeks     apixaban  2.5 mg Tab  Commonly known as: ELIQUIS   Take 1 tablet (2.5 mg total) by mouth two (2) times a day.     cranberry 500 mg Cap  Take 500 mg by mouth daily with evening meal.     cyclobenzaprine  10 MG tablet  Commonly known as: FLEXERIL   Take 1 tablet (10 mg total) by mouth two (2) times a day as needed for muscle spasms.     FeroSul 325 mg (65 mg iron ) tablet  Generic drug: ferrous sulfate   TAKE 1 TABLET EVERY MONDAY , WEDNESDAY, AND FRIDAY     fosfomycin 3 gram Pack  Commonly known as: MONUROL   Take 3 g by mouth once a week.     gabapentin  100 MG capsule  Commonly known as: NEURONTIN   Take 1 capsule (100 mg total) by mouth two (2) times a day.     LIDOCAINE  PAIN RELIEF  4 % patch  Generic drug: lidocaine   Place 1 patch on the skin daily. Remove after 12 hours.     midodrine  5 MG tablet  Commonly known as: PROAMATINE   Take 1 tablet (5 mg total) by mouth three (3) times a day.     NARCAN 4 mg/actuation nasal spray  Generic drug: naloxone  1 spray into alternating nostrils once as needed (opioid overdose). PRN - Emergency use.     nystatin   100,000 unit/gram powder  Commonly known as: MYCOSTATIN   Apply to affected area 3 times daily     oxyCODONE  5 MG immediate release tablet  Commonly known as: ROXICODONE   Take 1 tablet (5 mg total) by mouth every eight (8) hours as needed for pain.     OXYGEN -AIR DELIVERY SYSTEMS MISC  4 L by Miscellaneous route continuous.     pantoprazole  20 MG tablet  Commonly known as: Protonix   TAKE 1 TABLET(20 MG) BY MOUTH DAILY     rosuvastatin  5 MG tablet  Commonly known as: CRESTOR   TAKE 1 TABLET EVERY OTHER DAY     SPIRIVA  RESPIMAT 2.5 mcg/actuation inhalation mist  Generic drug: tiotropium bromide   Inhale 2 puffs daily.     spironolactone  25 MG tablet  Commonly known as: ALDACTONE   Take 0.5 tablets (12.5 mg total) by mouth daily.     VYNDAMAX  61 mg Cap  Generic drug: tafamidis   Take 1 capsule (61 mg) by mouth daily.              Pending Test Results:     Pending Labs       Order Current Status    Urine Culture In process            Discharge Instructions:   Activity Instructions       Activity as tolerated            Other Instructions       Discharge instructions      Discharge Instructions:    You were admitted to [facility] for generalized weakness, probably from dehydration. You were treated with IV fluids and holding your water  pills for a couple of days. You were briefly given antibiotics but we think its unlikely that you have an infection. You are now ready to discharge and will be discharging to: Home with Home Health Services.    Please carefully read and follow these instructions below upon your discharge. It has been a pleasure taking care of you, and we wish you the best.     1) Please take your medications as prescribed. Major medication changes are listed below, and a full list of medications is in this discharge packet. At future follow-up appointments, please be sure to take all of your medications with you so your provider can better guide your care.     MEDICATION CHANGES: None - Can resume your water  pills tomorrow 7/9    It is our best guess that your prior dose of water  pills will be the right balance for you. Please continue to weigh yourself daily. If you're gaining more than 2 pounds per day or 5 pounds in a week, you may need an increase in your diuretics. If you feel similar to when you came in, with no swelling and generally weak or dizzy, you should drink plenty of fluids and hold off on taking your water  pills.     FOLLOW-UP:  2) Seek medical care with your primary care doctor or local Emergency Room or Urgent Care if you develop any changes in your mental status, worsening abdominal pain, fevers greater than 101.5, any unexplained/unrelieved shortness of breath, uncontrolled nausea and vomiting that keeps you from remaining hydrated or taking your medication, or any other concerning symptoms.     3) It is recommended that you see your primary care provider (PCP) after being in the hospital. If you do not see an appointment listed below with your primary care doctor, please call your doctor's  office as soon as possible to schedule an appointment to be seen within 7-10 days of discharge. Your PCP is [name], MD and the phone number of their office is [phone]. Please call your PCP's office as soon as possible to schedule an appointment with them.     4) Please go to any follow-up appointments that were already scheduled. Some of your follow-up appointments have been listed in this discharge packet.        Follow Up instructions and Outpatient Referrals     Discharge instructions        Appointments which have been scheduled for you      Jul 05, 2024 11:30 AM  SN - HOME VISIT with Delon Piety, RN  [facility] AND HOSPICE [facility]) [address]  [address]  [place] [address]  [phone]        Jul 06, 2024 To Be Determined  PTA - HOME VISIT with Franky JONELLE Bristol, PTA  [facility] AND HOSPICE [facility]) [address]  [place] [address]  [phone]        Jul 10, 2024 4:10 PM  (Arrive by 3:55 PM)  RETURN VIDEO VISIT MYCHART with Velia Canales Myrna, MD  [facility] INTERNAL MEDICINE [place] [facility]) [address]  [facility] 1 through 4  [place] [address]  [phone]   Please sign into My [place] Chart at least 15 minutes before your appointment to complete the eCheck-In process. You must complete eCheck-In before you can start your video visit. We also recommend testing your audio and video connection to troubleshoot any issues before your visit begins. Click ???Join Video Visit??? to complete these checks. Once you have completed eCheck-In and tested your audio and video, click ???Join Call??? to connect to your visit.     For your video visit, you will need a computer with a working camera, speaker and microphone, a smartphone, or a tablet with internet access.    My [place] Chart enables you to manage your health, send non-urgent messages to your provider, view your test results, schedule and manage appointments, and request prescription refills securely and conveniently from your computer or mobile device.    You can go to [email or web address] to sign in to your My [place] Chart account with your username and password. If you have forgotten your username or password, please choose the ???Forgot Username???? and/or ???Forgot Password???? links to gain access. You also can access your My [place] Chart account with the free MyChart mobile app for Android or iPhone.    If you need assistance accessing your My [place] Chart account or for assistance in reaching your provider's office to reschedule or cancel your appointment, please call [facility] [phone].         Jul 11, 2024 To Be Determined  PTA - HOME VISIT with Franky JONELLE Bristol, PTA  [facility] AND HOSPICE [facility]) [address]  [place] [address]  [phone]        Jul 12, 2024 To Be Determined  SN - HOME VISIT with Delon Piety, RN  [facility] AND HOSPICE [facility]) [address]  [place] [address]  [phone]        Jul 13, 2024 To Be Determined  PT - REASSESSMENT with Suzen JAYSON Spar, PT  [facility] AND HOSPICE [place] [facility]) [address]  [address]  [place] [address]  [phone]        Jul 18, 2024 To Be Determined  PTA - HOME VISIT with Franky JONELLE Bristol, PTA  [facility] AND HOSPICE [facility]) [address]  [address]  [place] [address]  [phone]        Jul 19, 2024 To Be Determined  SN - HOME VISIT with Delon Piety, RN  [facility] AND HOSPICE [facility]) [address]  [address]  [place] [address]  [phone]        Jul 20, 2024 To Be Determined  PTA - HOME VISIT with Franky JONELLE Bristol, PTA  [facility] AND HOSPICE [facility]) [address]  [place] [address]  [phone]        Jul 25, 2024 To Be Determined  PTA - HOME VISIT with Franky JONELLE Bristol, PTA  [facility] AND HOSPICE [facility]) [address]  [place] [address]  [phone]        Jul 26, 2024 To Be Determined  SN - HOME VISIT with Delon Piety, RN  [facility] AND HOSPICE [facility]) [address]  [place] [address]  [phone]        Jul 27, 2024 To Be Determined  PTA - HOME VISIT with Franky JONELLE Bristol, PTA  [facility] AND HOSPICE [facility]) [address]  [place] [address]  [phone]        Aug 01, 2024 To Be Determined  PTA - HOME VISIT with Franky JONELLE Bristol, PTA  [facility] AND HOSPICE [facility]) [address]  [place] [address]  [phone]        Aug 02, 2024 1:30 PM  (Arrive by 1:15 PM)  RETURN HEART FAILURE with Damien Brodie Lee, AGNP  [facility] [place] [facility]) [address]  [facility] 1 through 4  [place] [address]  [phone]        Aug 02, 2024 2:30 PM  (Arrive by 2:15 PM)  INFUSION ONLY with UNCTIF 08  [facility] THERAPEUTIC INFUSION CTR [place] [facility]) [address]  [facility] 1 through 4  [place] [address]  [phone]        Aug 03, 2024 To Be Determined  PTA - HOME VISIT with Franky JONELLE Bristol, PTA  [facility] AND HOSPICE [facility]) [address]  [place] [address]  [phone]        Aug 08, 2024 To Be Determined  PTA - HOME VISIT with Franky JONELLE Bristol, PTA  [facility] AND HOSPICE [facility]) [address]  [place] [address]  [phone]        Aug 10, 2024 To Be Determined  PTA - HOME VISIT with Franky JONELLE  [place]  [facility] AND HOSPICE SCHEDULING [facility]) [address]  [place] [address]  [phone]        Aug 15, 2024 To Be Determined  PT - REASSESSMENT with Suzen JAYSON Spar, PT  [facility] AND HOSPICE [facility]) [address]  [place] [address]  [phone]        Sep 11, 2024 1:40 PM  (Arrive by 1:25 PM)  RETURN CONTINUITY with Velia Corena Novak, MD  [facility] INTERNAL MEDICINE [place] [facility]) [address]  [facility] 1 through 4  [place] [address]  [phone]        Sep 14, 2024 11:00 AM  (Arrive by 10:45 AM)  PFT with PFT 4  [facility] PULMONARY SPECIALTY FUNCT [place]) [address]  [place] [address]  [place] [address]  [phone]   If you have inhaled breathing medications, please do not use it 4 hours prior to this breathing test.  Please wear comfortable clothing and well-fitting , closed toe shoes in the even that a 6-minute walk is part of your test.  If you have fallen in the past month please inform your respiratory therapist at the start of your appointment.    While you may bring a guest to your appointment, they will not be able to be present in the testing area.         Sep 14, 2024 12:00 PM  (Arrive by 11:45 AM)  RETURN PULMONARY HYPERTENSION with Heron Jama Hitt, MD  [facility] PULMONARY SPECIALTY CL [place] [facility]) [address]  [facility] 1 through 4  [place] [address]  [phone]                I spent greater than 30 minutes in the discharge of this patient.

## 2024-07-10 DIAGNOSIS — N184 Chronic kidney disease, stage 4 (severe): Principal | ICD-10-CM

## 2024-07-10 DIAGNOSIS — I43 Cardiomyopathy in diseases classified elsewhere: Principal | ICD-10-CM

## 2024-07-10 DIAGNOSIS — D649 Anemia, unspecified: Principal | ICD-10-CM

## 2024-07-10 DIAGNOSIS — J42 Unspecified chronic bronchitis: Principal | ICD-10-CM

## 2024-07-10 DIAGNOSIS — E854 Organ-limited amyloidosis: Principal | ICD-10-CM

## 2024-07-10 DIAGNOSIS — Z09 Encounter for follow-up examination after completed treatment for conditions other than malignant neoplasm: Principal | ICD-10-CM

## 2024-07-10 MED ORDER — ALBUTEROL SULFATE 2.5 MG/3 ML (0.083 %) SOLUTION FOR NEBULIZATION
RESPIRATORY_TRACT | 2 refills | 5.00000 days | Status: CP | PRN
Start: 2024-07-10 — End: 2025-07-10

## 2024-07-10 NOTE — Unmapped (Signed)
 Internal Medicine Hospital Follow-Up video Visit    ASSESSMENT / PLAN:  Assessment & Plan  Chronic obstructive pulmonary disease with acute exacerbation  Persistent cough despite Tessalon  Perles. Considering nebulizer due to difficulty with inhalers.  - Order nebulizer with albuterol  treatments.  - Continue Tessalon  DM for cough management.  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 Reports leg swelling.  - Continue current diuretics: torsemide  and spironolactone .  -Check BMP  (will ask home health to do it)    Iron  deficiency anemia due to chronic blood loss  Low hemoglobin. Oral iron  supplementation ongoing.  - Check hemoglobin levels.  - Continue oral iron  supplementation.    Hyperthyroidism  Thyroid levels normalized.    Goals of Care  Prefers hospital care if illness occurs.  - Discuss and document preference for hospital care in case of illness.    Follow-up  Nurse visits weekly. Lab work planned before provider's absence. Appointments scheduled.  - Order lab work and coordinate with visiting nurse for collection.  - Schedule video appointment in one month.  - Ensure lab results are sent to [facility].        1. Chronic bronchitis, unspecified chronic bronchitis type       2. Cardiac amyloidosis       3. Chronic kidney disease ([name]), stage IV (severe)       4. Anemia, unspecified type            Medication adjustments:   none    Follow Up: Return in about 1 month (around 08/10/2024).   ussed. Patient / caregiver voiced understanding.      This visit is conducted via IT consultant Information  Person Contacted: Patient  Contact Phone number: [phone] (home)   Is there someone else in the room? Yes. What is your relationship? Daughter . Do you want this person here for the visit? Yes .  Patient agreed to a video visit    Barbara Huber is a 83 y.o. female participating in a video encounter.    CHIEF COMPLAINT: Hospital Follow-Up for   Hospitalization Follow-up    Date of Hospitalization Discharge:  07/04/24       HISTORY OF PRESENT ILLNESS:  Summary of hospitalization:   Admitted with weakness. Felt to be 2/2/ AKI.   CXR:   *  Mild interstitial thickening could be due to mild edema or infectious/inflammatory airways disease.   *  Query small bilateral pleural effusions.         Interval update:  History of Present Illness  Barbara Huber is an 83 year old female who presents for follow-up care after a recent hospitalization/observation. She is accompanied by [name], her daughter .    Discharged from the hospital on July 05, 2024, she is currently on 3 liters of oxygen . Her weight remains at 169 pounds, consistent with her weight a month ago. She is taking 80 mg of torsemide  daily and half a tablet of spironolactone . She experiences swelling in her legs.    She has a history of anemia, with her thyroid levels now normalized. She takes iron  pills on Monday, Wednesday, and Friday    Her blood pressure at home is 107/61 mmHg. She uses a walker for mobility. She has a cough that sometimes improves and occasionally produces sputum. Tessalon  Perles were ineffective, so she uses  Tessalon  DM instead. The cough does not worsen when lying down.    She has a sore on her sacral area  that is improving. She sometimes experiences dyspnea.    She has had multiple hospitalizations recently and prefers to go to the hospital if she becomes ill again.    SOCIAL/FAMILY HISTORY:  Social History:  Reviewed in Epic    REVIEW OF SYSTEMS:    All other systems reviewed are negative except as noted here or in HPI.    PHYSICAL EXAM  Vitals:    Vitals:    07/10/24 1443   BP: 107/61       Exam:  On oxygen -mild dyspnea, but able to speak full sentences   Results  LABS  RBC: decreased (07/05/2024)  Thyroid function tests: normal (07/05/2024)  Hemoglobin: decreased (07/05/2024)      Labs:  Reviewed from discharge.  See Epic labs.  Radiology: Reviewed radiology studies from discharge. See in Epic.       MEDICAL/SURGICAL HISTORY:  Problem List[1]    The patient reports they are physically located in [place]  and is currently: at home. I conducted a audio/video visit. I spent  31m 42s on the video call with the patient. I spent an additional 15 minutes on pre- and post-visit activities on the date of service .           [1]   Patient Active Problem List  Diagnosis    Spinal stenosis of lumbar region    Thoracic or lumbosacral neuritis or radiculitis    Lumbosacral spondylosis    COPD (chronic obstructive pulmonary disease)       Sleep apnea in adult    Essential hypertension    Type 2 diabetes mellitus with stage 4 chronic kidney disease, with long-term current use of insulin        Chronic cystitis    Incomplete bladder emptying    Peripheral neuropathy    Chronic respiratory failure with hypoxia       Persistent atrial fibrillation       Anemia in chronic kidney disease    Chronic kidney disease ([name]), stage IV (severe)       Pulmonary hypertension       Chronic prescription opiate use    Bronchitis    Chronic heart failure with preserved ejection fraction       Cardiac amyloidosis       Recurrent cold sores    Dyspepsia    Iron  deficiency anemia due to chronic blood loss    Neuropathy    Left hip pain    Familial amyloid heart disease       Hereditary amyloidosis       Osteoporosis with current pathological fracture    Troponin level elevated    Thrombocytopenia    Difficulty walking    Pneumonia due to infectious organism    Dyspnea

## 2024-07-10 NOTE — Unmapped (Signed)
 [facility] Internal Medicine at [facility]     Are you located in [place]? yes    Reason for visit: Hospital Follow up    Questions / Concerns that need to be addressed: no concerns    PTHomeBP     Diabetes:  Regularly checking blood sugars?: yes  If yes, when? Complete log for past 7 days  Date Before Breakfast After Breakfast Before Lunch After Lunch Before Dinner After Dinner Before Bed    07/10/2024  100                                                                                                                             HCDM reviewed and updated in Epic:    We are working to make sure all of our patients??? wishes are updated in Epic and part of that is documenting a Environmental health practitioner for each patient  A Health Care Decision Maker is someone you choose who can make health care decisions for you if you are not able - who would you most want to do this for you????  is already up to date.    HCDM (With Legal Document To Support): [name],[name] - Daughter - [phone]    BPAs completed:    __________________________________________________________________________________________    SCREENINGS COMPLETED IN FLOWSHEETS      AUDIT       PHQ2       PHQ9          GAD7       COPD Assessment       Falls Risk

## 2024-07-11 ENCOUNTER — Ambulatory Visit: Admit: 2024-07-11 | Discharge: 2024-07-12 | Payer: Medicare (Managed Care)

## 2024-07-11 ENCOUNTER — Encounter: Admit: 2024-07-11 | Discharge: 2024-07-12 | Payer: Medicare (Managed Care)

## 2024-07-11 DIAGNOSIS — J189 Pneumonia, unspecified organism: Principal | ICD-10-CM

## 2024-07-11 DIAGNOSIS — G8929 Other chronic pain: Principal | ICD-10-CM

## 2024-07-11 DIAGNOSIS — N184 Chronic kidney disease, stage 4 (severe): Principal | ICD-10-CM

## 2024-07-11 DIAGNOSIS — G4733 Obstructive sleep apnea (adult) (pediatric): Principal | ICD-10-CM

## 2024-07-11 DIAGNOSIS — I110 Hypertensive heart disease with heart failure: Principal | ICD-10-CM

## 2024-07-11 DIAGNOSIS — R052 Subacute cough: Principal | ICD-10-CM

## 2024-07-11 DIAGNOSIS — J440 Chronic obstructive pulmonary disease with acute lower respiratory infection: Principal | ICD-10-CM

## 2024-07-11 DIAGNOSIS — E1122 Type 2 diabetes mellitus with diabetic chronic kidney disease: Principal | ICD-10-CM

## 2024-07-11 DIAGNOSIS — J441 Chronic obstructive pulmonary disease with (acute) exacerbation: Principal | ICD-10-CM

## 2024-07-11 DIAGNOSIS — E854 Organ-limited amyloidosis: Principal | ICD-10-CM

## 2024-07-11 DIAGNOSIS — R339 Retention of urine, unspecified: Principal | ICD-10-CM

## 2024-07-11 DIAGNOSIS — I4819 Other persistent atrial fibrillation: Principal | ICD-10-CM

## 2024-07-11 DIAGNOSIS — I5032 Chronic diastolic (congestive) heart failure: Principal | ICD-10-CM

## 2024-07-11 DIAGNOSIS — L989 Disorder of the skin and subcutaneous tissue, unspecified: Principal | ICD-10-CM

## 2024-07-11 DIAGNOSIS — J9612 Chronic respiratory failure with hypercapnia: Principal | ICD-10-CM

## 2024-07-11 DIAGNOSIS — Z9981 Dependence on supplemental oxygen: Principal | ICD-10-CM

## 2024-07-11 DIAGNOSIS — G629 Polyneuropathy, unspecified: Principal | ICD-10-CM

## 2024-07-11 DIAGNOSIS — I272 Pulmonary hypertension, unspecified: Principal | ICD-10-CM

## 2024-07-11 DIAGNOSIS — Z556 Problems related to health literacy: Principal | ICD-10-CM

## 2024-07-11 DIAGNOSIS — Z7951 Long term (current) use of inhaled steroids: Principal | ICD-10-CM

## 2024-07-11 DIAGNOSIS — J9611 Chronic respiratory failure with hypoxia: Principal | ICD-10-CM

## 2024-07-11 DIAGNOSIS — Z7901 Long term (current) use of anticoagulants: Principal | ICD-10-CM

## 2024-07-11 DIAGNOSIS — E079 Disorder of thyroid, unspecified: Principal | ICD-10-CM

## 2024-07-11 DIAGNOSIS — D631 Anemia in chronic kidney disease: Principal | ICD-10-CM

## 2024-07-11 LAB — CBC W/ AUTO DIFF
BASOPHILS ABSOLUTE COUNT: 0.1 10*9/L (ref 0.0–0.1)
BASOPHILS RELATIVE PERCENT: 1.3 %
EOSINOPHILS ABSOLUTE COUNT: 0.1 10*9/L (ref 0.0–0.5)
EOSINOPHILS RELATIVE PERCENT: 1.4 %
HEMATOCRIT: 28.9 % — ABNORMAL LOW (ref 34.0–44.0)
HEMOGLOBIN: 9.5 g/dL — ABNORMAL LOW (ref 11.3–14.9)
LYMPHOCYTES ABSOLUTE COUNT: 1 10*9/L — ABNORMAL LOW (ref 1.1–3.6)
LYMPHOCYTES RELATIVE PERCENT: 13.8 %
MEAN CORPUSCULAR HEMOGLOBIN CONC: 32.9 g/dL (ref 32.0–36.0)
MEAN CORPUSCULAR HEMOGLOBIN: 31.1 pg (ref 25.9–32.4)
MEAN CORPUSCULAR VOLUME: 94.4 fL (ref 77.6–95.7)
MEAN PLATELET VOLUME: 8.1 fL (ref 6.8–10.7)
MONOCYTES ABSOLUTE COUNT: 0.9 10*9/L — ABNORMAL HIGH (ref 0.3–0.8)
MONOCYTES RELATIVE PERCENT: 11.8 %
NEUTROPHILS ABSOLUTE COUNT: 5.5 10*9/L (ref 1.8–7.8)
NEUTROPHILS RELATIVE PERCENT: 71.7 %
NUCLEATED RED BLOOD CELLS: 0 /100{WBCs} (ref <=4)
PLATELET COUNT: 192 10*9/L (ref 150–450)
RED BLOOD CELL COUNT: 3.07 10*12/L — ABNORMAL LOW (ref 3.95–5.13)
RED CELL DISTRIBUTION WIDTH: 16.5 % — ABNORMAL HIGH (ref 12.2–15.2)
WBC ADJUSTED: 7.6 10*9/L (ref 3.6–11.2)

## 2024-07-11 LAB — BASIC METABOLIC PANEL
ANION GAP: 16 mmol/L — ABNORMAL HIGH (ref 5–14)
BLOOD UREA NITROGEN: 69 mg/dL — ABNORMAL HIGH (ref 9–23)
BUN / CREAT RATIO: 28
CALCIUM: 8.8 mg/dL (ref 8.7–10.4)
CHLORIDE: 96 mmol/L — ABNORMAL LOW (ref 98–107)
CO2: 27.1 mmol/L (ref 20.0–31.0)
CREATININE: 2.43 mg/dL — ABNORMAL HIGH (ref 0.55–1.02)
EGFR CKD-EPI (2021) FEMALE: 19 mL/min/1.73m2 — ABNORMAL LOW (ref >=60)
GLUCOSE RANDOM: 156 mg/dL (ref 70–179)
POTASSIUM: 4.4 mmol/L (ref 3.4–4.8)
SODIUM: 139 mmol/L (ref 135–145)

## 2024-07-11 LAB — IRON PANEL
IRON SATURATION (CALC): 7 %
IRON: 19 ug/dL — ABNORMAL LOW (ref 50–170)
TOTAL IRON BINDING CAPACITY (CALC): 263.3 ug/dL (ref 250.0–425.0)
TRANSFERRIN: 209 mg/dL — ABNORMAL LOW (ref 250.0–380.0)

## 2024-07-11 MED ORDER — HYDROCODONE 10 MG-CHLORPHENIRAMINE 8 MG/5 ML ORAL SUSP EXTEND.REL 12HR
Freq: Two times a day (BID) | ORAL | 0 refills | 12.00000 days | Status: CP | PRN
Start: 2024-07-11 — End: ?

## 2024-07-12 DIAGNOSIS — E079 Disorder of thyroid, unspecified: Principal | ICD-10-CM

## 2024-07-12 DIAGNOSIS — D631 Anemia in chronic kidney disease: Principal | ICD-10-CM

## 2024-07-12 DIAGNOSIS — R339 Retention of urine, unspecified: Principal | ICD-10-CM

## 2024-07-12 DIAGNOSIS — I5032 Chronic diastolic (congestive) heart failure: Principal | ICD-10-CM

## 2024-07-12 DIAGNOSIS — I110 Hypertensive heart disease with heart failure: Principal | ICD-10-CM

## 2024-07-12 DIAGNOSIS — E854 Organ-limited amyloidosis: Principal | ICD-10-CM

## 2024-07-12 DIAGNOSIS — I272 Pulmonary hypertension, unspecified: Principal | ICD-10-CM

## 2024-07-12 DIAGNOSIS — E1122 Type 2 diabetes mellitus with diabetic chronic kidney disease: Principal | ICD-10-CM

## 2024-07-12 DIAGNOSIS — G629 Polyneuropathy, unspecified: Principal | ICD-10-CM

## 2024-07-12 DIAGNOSIS — G8929 Other chronic pain: Principal | ICD-10-CM

## 2024-07-12 DIAGNOSIS — G4733 Obstructive sleep apnea (adult) (pediatric): Principal | ICD-10-CM

## 2024-07-12 DIAGNOSIS — Z556 Problems related to health literacy: Principal | ICD-10-CM

## 2024-07-12 DIAGNOSIS — Z7951 Long term (current) use of inhaled steroids: Principal | ICD-10-CM

## 2024-07-12 DIAGNOSIS — N184 Chronic kidney disease, stage 4 (severe): Principal | ICD-10-CM

## 2024-07-12 DIAGNOSIS — Z9981 Dependence on supplemental oxygen: Principal | ICD-10-CM

## 2024-07-12 DIAGNOSIS — J9612 Chronic respiratory failure with hypercapnia: Principal | ICD-10-CM

## 2024-07-12 DIAGNOSIS — Z7901 Long term (current) use of anticoagulants: Principal | ICD-10-CM

## 2024-07-12 DIAGNOSIS — J9611 Chronic respiratory failure with hypoxia: Principal | ICD-10-CM

## 2024-07-12 DIAGNOSIS — J440 Chronic obstructive pulmonary disease with acute lower respiratory infection: Principal | ICD-10-CM

## 2024-07-12 DIAGNOSIS — J441 Chronic obstructive pulmonary disease with (acute) exacerbation: Principal | ICD-10-CM

## 2024-07-12 DIAGNOSIS — L989 Disorder of the skin and subcutaneous tissue, unspecified: Principal | ICD-10-CM

## 2024-07-12 DIAGNOSIS — J189 Pneumonia, unspecified organism: Principal | ICD-10-CM

## 2024-07-12 DIAGNOSIS — I4819 Other persistent atrial fibrillation: Principal | ICD-10-CM

## 2024-07-12 NOTE — Unmapped (Signed)
 Low iron  levels, but has not tolerated iron  infusions well before. Proposed to pt and daughter-increasing oral iron , and stopping eliquis .   Bun improved, creatinine sl improved. Will cont current regimen of torsemide 

## 2024-07-13 DIAGNOSIS — G8929 Other chronic pain: Principal | ICD-10-CM

## 2024-07-13 DIAGNOSIS — L989 Disorder of the skin and subcutaneous tissue, unspecified: Principal | ICD-10-CM

## 2024-07-13 DIAGNOSIS — D631 Anemia in chronic kidney disease: Principal | ICD-10-CM

## 2024-07-13 DIAGNOSIS — I272 Pulmonary hypertension, unspecified: Principal | ICD-10-CM

## 2024-07-13 DIAGNOSIS — G4733 Obstructive sleep apnea (adult) (pediatric): Principal | ICD-10-CM

## 2024-07-13 DIAGNOSIS — Z9981 Dependence on supplemental oxygen: Principal | ICD-10-CM

## 2024-07-13 DIAGNOSIS — E1122 Type 2 diabetes mellitus with diabetic chronic kidney disease: Principal | ICD-10-CM

## 2024-07-13 DIAGNOSIS — I5032 Chronic diastolic (congestive) heart failure: Principal | ICD-10-CM

## 2024-07-13 DIAGNOSIS — E079 Disorder of thyroid, unspecified: Principal | ICD-10-CM

## 2024-07-13 DIAGNOSIS — J441 Chronic obstructive pulmonary disease with (acute) exacerbation: Principal | ICD-10-CM

## 2024-07-13 DIAGNOSIS — G629 Polyneuropathy, unspecified: Principal | ICD-10-CM

## 2024-07-13 DIAGNOSIS — J189 Pneumonia, unspecified organism: Principal | ICD-10-CM

## 2024-07-13 DIAGNOSIS — R339 Retention of urine, unspecified: Principal | ICD-10-CM

## 2024-07-13 DIAGNOSIS — E854 Organ-limited amyloidosis: Principal | ICD-10-CM

## 2024-07-13 DIAGNOSIS — Z7951 Long term (current) use of inhaled steroids: Principal | ICD-10-CM

## 2024-07-13 DIAGNOSIS — J440 Chronic obstructive pulmonary disease with acute lower respiratory infection: Principal | ICD-10-CM

## 2024-07-13 DIAGNOSIS — I4819 Other persistent atrial fibrillation: Principal | ICD-10-CM

## 2024-07-13 DIAGNOSIS — Z7901 Long term (current) use of anticoagulants: Principal | ICD-10-CM

## 2024-07-13 DIAGNOSIS — I110 Hypertensive heart disease with heart failure: Principal | ICD-10-CM

## 2024-07-13 DIAGNOSIS — J9612 Chronic respiratory failure with hypercapnia: Principal | ICD-10-CM

## 2024-07-13 DIAGNOSIS — N184 Chronic kidney disease, stage 4 (severe): Principal | ICD-10-CM

## 2024-07-13 DIAGNOSIS — Z556 Problems related to health literacy: Principal | ICD-10-CM

## 2024-07-13 DIAGNOSIS — J9611 Chronic respiratory failure with hypoxia: Principal | ICD-10-CM

## 2024-07-13 MED ORDER — FERROUS SULFATE 325 MG (65 MG IRON) TABLET
ORAL_TABLET | Freq: Every day | ORAL | 1 refills | 90.00000 days | Status: CP
Start: 2024-07-13 — End: ?

## 2024-07-13 NOTE — Unmapped (Signed)
 [facility] Internal Medicine   CARE MANAGEMENT ENCOUNTER           Date of Service:  07/13/2024      MyChart use by patient is active: no    Post-outreach Action Items:  Provider: N/A.  CP: N/A.  Patient: N/A.    Purpose of contact:     Care Partner (CP) received request to facilitate Nebulizer and Supplies order.    CP completed the following related to the above request:  Reviewed chart  Identified Supplier    E-faxed order, pt demographics, and clinical documentation to [facility] (P: [phone], F: [phone]).  Placed call to [facility] (P: [phone], F: [phone])     Contacted Patient via phone and notified of the above.    Patient was encouraged to reach out to their provider with any questions or concerns.    A copy of this Patient Outreach Encounter was sent to patient's Primary Care Provider.

## 2024-07-17 NOTE — Unmapped (Signed)
 [facility] [name] Clinic Note    Primary Cardiologist/Cardiology Provider(s):  Damien Lee   PCP:  [name], MD    Last Cardiology Clinic Visit:  06/22/2024  Last [name] Visit:     Reason for Visit:  Barbara Huber is a 83 y.o. female with a history of cardiac amyloidosis, [name], who is being seen today in the [facility] [name] Clinic for an urgent visit for volume status optimization including [name].    Assessment/Plan:  Acute on Chronic diastolic heart failure  - Volume status today is unchanged. Home weights have been 168-169lbs for the last two weeks.   - NYHA Class III  - IV Lasix   was not administered during clinic given improvement in volume status  - Continue Torsemide  80mg  daily. Instruct her to take torsemide  60mg  BID on days with weight gain of 2-3lbs overnight/fluid retention symptoms.   - Labs were obtained: Cr 1.85, K 4.3, pro-BNP 4867 (was 5774 on 7/7)    Response to [name]:         Medications Heart rate Blood pressure Weight (lbs)   Pre  69 131/53 169.9   Hour 0 IV Lasix  0 mg      Hour 2 IV Lasix  0 mg      Post             Output:  I/O       None            Return to clinic:    Return in 15 days (on 08/02/2024).    Future Appointments   Date Time Provider Department Center   07/19/2024 To Be Determined [name] [facility]   07/20/2024  1:00 PM Lenon Nest, RN [facility]   07/21/2024 To Be Determined [name] [facility]   07/24/2024 To Be Determined [name] [facility]   07/26/2024 To Be Determined Lenon Nest, RN [facility]   07/27/2024 To Be Determined [name] [facility]   08/01/2024 To Be Determined Alvena Suzen BROCKS, PT [facility]   08/02/2024  1:30 PM Lee, [name], AGNP UNCHRTVASET [facility]   08/02/2024  2:30 PM UNCTIF 08 UNCTHERINFET [facility]   08/03/2024 To Be Determined Alvena Suzen BROCKS, PT [facility]   08/08/2024 To Be Determined [name] [facility]   08/10/2024 To Be Determined [name] [facility]   08/15/2024 To Be Determined Alvena Suzen BROCKS, PT [facility]   09/11/2024  1:40 PM [name], MD UNCINTMEDET [facility]   09/14/2024 11:00 AM PFT 1 UNCPULSPFUET [facility]   09/14/2024 12:00 PM [name], MD UNCPULSPCLET [facility]       History of Present Illness:  Barbara Huber is a 83 y.o. female with a history of [name], cardiac amyloidosis who presents today for [name]. Patient last seen in HF clinic  on 04/26/24. At that visit, she presented with mild LE edema L>R and elevated JVP, advised to take metolazone  5mg  once. Her home weight was around 175-176lbs on torsemide  80mg  daily.     Her daughter reached out to Damien Lee that patient has some swelling in her feet and requested to be seen in [name] clinic.     Today, she presents to the diuresis clinic with concerning  of ankle swelling. She is accompanied by her daughter. She reports ankle swelling (L>R) for about 2 weeks, occasional abdominal bloating, but not today. She has chronic SOB, and this has not changed or worsened. She has been experiencing a wet cough for about a month, which started after an episode of pneumonia. She has completed antibiotics treatment for pneumonia. She reports sleeping well, denies orthopnea or PND.        Past Medical History[1]  Past Surgical History[2]    Current Medications[3]  Allergies[4]    Objective:      Physical Exam  BP 131/53 (BP Site: R Arm, BP Position: Sitting, BP Cuff Size: Medium)  - Pulse 69  - Ht 167.6 cm (5' 6)  - Wt 77.1 kg (169 lb 14.4 oz)  - LMP  (LMP Unknown)  - SpO2 99% Comment: 3 Liters 02 - PF (!) 3 L/min  - BMI 27.42 kg/m??    Wt Readings from Last 12 Encounters:   07/18/24 77.1 kg (169 lb 14.4 oz)   07/11/24 77.1 kg (169 lb 14.4 oz)   07/10/24 76.4 kg (168 lb 6.4 oz)   07/05/24 76 kg (167 lb 8 oz)   07/04/24 75.3 kg (166 lb) 06/30/24 75.5 kg (166 lb 6.4 oz)   06/21/24 78 kg (171 lb 14.4 oz)   06/16/24 80.8 kg (178 lb 1.6 oz)   06/11/24 77.3 kg (170 lb 6.4 oz)   06/08/24 79.4 kg (175 lb)   05/31/24 87.5 kg (193 lb)   04/26/24 79.5 kg (175 lb 4.8 oz)       General:  Patient is well-appearing in no acute distress   Eyes:  Intact, sclerae anicteric.   Ears, nose, mouth: Benign   Respiratory:   Normal respiratory effort on 3L [place]. Clear to auscultation on right side. Rhonchi on left side.  There are no wheezes.   Cardiovascular:  JVP not seen above the clavicle with HOB at 90 degrees.  Rate and rhythm are regular.  There is no lifts or heaves.  Normal S1, S2. There is no murmur, gallops or rubs.  Radial and pedal pulses are 2+, bilaterally.   There is trace pedal/pretibial edema, left greater than right.    Gastrointestinal:   Soft, non-tender, with audible bowel sounds. Abdomen nondistended.  Liver is nonpalpable.   Musculoskeletal: No joint swelling   Skin: Warm, well perfused.   Neurologic: Appropriate mood and affect. Alert and oriented to person, place, and time. No gross motor or sensory deficits evident.     Recent Labs:  Office Visit on 07/18/2024   Component Date Value Ref Range Status    Sodium 07/18/2024 140  135 - 145 mmol/L Final    Potassium 07/18/2024 4.3  3.4 - 4.8 mmol/L Final    Chloride 07/18/2024 100  98 - 107 mmol/L Final    CO2 07/18/2024 29.9  20.0 - 31.0 mmol/L Final    Anion Gap 07/18/2024 10  5 - 14 mmol/L Final    BUN 07/18/2024 48 (H)  9 - 23 mg/dL Final    Creatinine 92/77/7974 1.85 (H)  0.55 - 1.02 mg/dL Final    BUN/Creatinine Ratio 07/18/2024 26   Final    eGFR [name]-EPI (2021) Female 07/18/2024 27 (L)  >=60 mL/min/1.3m2 Final    Glucose 07/18/2024 131  70 - 179 mg/dL Final    Calcium  07/18/2024 8.7  8.7 - 10.4 mg/dL Final    PRO-BNP 92/77/7974 4,867.0 (H)  <=300.0 pg/mL Final  Lab Results   Component Value Date    PRO-BNP 4,867.0 (H) 07/18/2024    PRO-BNP 5,774.0 (H) 07/03/2024    PRO-BNP 9,703.0 (H) 06/21/2024    PRO-BNP 15,236.0 (H) 06/13/2024    PRO-BNP 7,546.0 (H) 06/11/2024    Creatinine 1.85 (H) 07/18/2024    Creatinine 2.43 (H) 07/11/2024    Creatinine 2.53 (H) 07/04/2024    Creatinine 2.86 (H) 07/03/2024    Creatinine 2.13 (H) 06/21/2024    Creatinine 0.79 01/06/2011    BUN 48 (H) 07/18/2024    BUN 69 (H) 07/11/2024    BUN 101 (H) 07/04/2024    BUN 89 (H) 07/03/2024    BUN 86 (H) 06/21/2024    BUN 20 01/06/2011    Sodium 140 07/18/2024    Sodium 139 07/11/2024    Sodium 140 07/04/2024    Sodium 136 07/03/2024    Sodium 135 06/21/2024    Sodium 140 01/06/2011    Sodium Whole Blood 135 07/03/2024    Potassium 4.3 07/18/2024    Potassium 4.4 07/11/2024    Potassium 3.5 07/04/2024    Potassium 3.9 07/03/2024    Potassium 3.8 06/21/2024    Potassium 4.1 01/06/2011    Potassium, Bld 3.6 07/03/2024    Magnesium  2.6 07/03/2024    Magnesium  2.3 06/16/2024    Magnesium  2.3 06/15/2024    Magnesium  2.3 06/14/2024    Magnesium  2.3 06/13/2024    Magnesium  2.0 01/06/2011       Metric Tracker:  Did today's visit result in [name]? No  Did today's visit result in hospital admission? No  Did today's visit result in referral to cardiology? n/a  Today's visit was a referral from Self         [1]   Past Medical History:  Diagnosis Date    Acute exacerbation of CHF (congestive heart failure)    08/19/2023    Acute kidney injury superimposed on chronic kidney disease 10/11/2020    Acute on chronic diastolic (congestive) heart failure    08/23/2020    Acute on chronic diastolic congestive heart failure    08/23/2020    AKI (acute kidney injury) 04/14/2015    Lab Results  Component  Value  Date     CREATININE  1.90 (H)  06/12/2021     Had a bump in her creatinine when she was taking her diuretics every day.  She is currently taking 40 mg daily of torsemide  and 50 mg of spironolactone .  Her volume status is fragile.  Previously when she stopped her diuretic she becomes short of breath.  Plan: We will check her BMP today.  We will likely have to go to 40    Anemia 08/22/2019    Iron  deficiency anemia          Lab Results      Component    Value    Date           WBC    9.2    03/21/2021           RBC    3.67 (L)    03/21/2021           HGB    9.9 (L)    03/21/2021           HCT    30.7 (L)    03/21/2021           MCV    83.7    03/21/2021  MCH    26.9    03/21/2021           MCHC    32.1    03/21/2021           RDW    21.9 (H)    03/21/2021           PLT    318     Arthritis     Calculus of kidney     Calculus of ureter     CHF (congestive heart failure)        Chronic atrial fibrillation    07/20/2019    COPD (chronic obstructive pulmonary disease)        Diabetes        Gangrenous cholecystitis 10/11/2020    Generalized edema  06/17/2021    GERD (gastroesophageal reflux disease)     Hydronephrosis     Hypertension     Hyponatremia 10/11/2020    Intermediate coronary syndrome    03/13/2014    Lower extremity edema 09/28/2020    Lumbar stenosis     Microscopic hematuria     Nausea alone     Nephrolithiasis 04/17/2016    Neuropathy     Nocturia     Nocturia 07/01/2017    Other chronic cystitis     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  See plans for anemia, AKI  We are also tapering her gabapentin .  Currently on 300 mg 3 times daily.  We will decrease it to twice daily, and then nightly.  She states she is not having any recurrence of her pain.    Pulmonary hypertension        Renal colic     Shortness of breath 04/28/2020    Sleep apnea     Unstable angina pectoris    03/13/2014   [2]   Past Surgical History:  Procedure Laterality Date    BACK SURGERY  1995    CARPAL TUNNEL RELEASE Left 2014    HYSTERECTOMY  1971    IR INSERT CHOLECYSTOSTOMY TUBE PERCUTANEOUS  10/02/2020    IR INSERT CHOLECYSTOSMY TUBE PERCUTANEOUS 10/02/2020 Ivonne Butler Seed, MD IMG VIR [facility]    LUMBAR DISC SURGERY      PR REMOVAL GALLBLADDER N/A 10/06/2020    Procedure: CHOLECYSTECTOMY;  Surgeon: Curtistine Mira Dunnings, MD;  Location: MAIN OR [facility];  Service: Trauma    PR RIGHT HEART CATH O2 SATURATION & CARDIAC OUTPUT N/A 09/30/2020    Procedure: Right Heart Catheterization;  Surgeon: Reyes Jerilynn Sage, MD;  Location: [facility] CATH;  Service: Cardiology    PR RIGHT HEART CATH O2 SATURATION & CARDIAC OUTPUT N/A 01/09/2021    Procedure: Right Heart Catheterization;  Surgeon: Norleen Deward Grumbling, MD;  Location: [facility] CATH;  Service: Cardiology   [3]   Current Outpatient Medications   Medication Sig Dispense Refill    acetaminophen  (TYLENOL ) 500 MG tablet Take 2 tablets (1,000 mg total) by mouth Three (3) times a day as needed for pain.      albuterol  2.5 mg /3 mL (0.083 %) nebulizer solution Inhale 3 mL (2.5 mg total) by nebulization every four (4) hours as needed for wheezing. 90 mL 2    albuterol  HFA 90 mcg/actuation inhaler Inhale 1 puff every six (6) hours as needed for wheezing. 8 g 3    amiodarone  (PACERONE ) 200 MG tablet Take 1 tablet (200 mg total) by mouth daily. 30 tablet 11    cranberry  500 mg cap Take 500 mg by mouth daily with evening meal.      cyclobenzaprine  (FLEXERIL ) 10 MG tablet Take 1 tablet (10 mg total) by mouth two (2) times a day as needed for muscle spasms. 60 tablet 11    ergocalciferol -1,250 mcg, 50,000 unit, (DRISDOL ) 1,250 mcg (50,000 unit) capsule TAKE 1 CAPSULE ONCE A WEEK 12 capsule 0    ferrous sulfate  (FEROSUL) 325 (65 FE) MG tablet Take 1 tablet (325 mg total) by mouth in the morning. 90 tablet 1    fluticasone  propion-salmeterol (ADVAIR  HFA) 115-21 mcg/actuation inhaler INHALE 2 PUFFS TWICE A DAY 12 g 5    fosfomycin (MONUROL ) 3 gram Pack Take 3 g by mouth once a week. 4 packet 6    gabapentin  (NEURONTIN ) 100 MG capsule Take 1 capsule (100 mg total) by mouth two (2) times a day. 60 capsule 11    midodrine  (PROAMATINE ) 5 MG tablet Take 1 tablet (5 mg total) by mouth three (3) times a day. 90 tablet 11    NARCAN 4 mg/actuation nasal spray 1 spray into alternating nostrils once as needed (opioid overdose). PRN - Emergency use.      nystatin  (MYCOSTATIN ) 100,000 unit/gram powder Apply to affected area 3 times daily 30 g 1    oxyCODONE  (ROXICODONE ) 5 MG immediate release tablet Take 1 tablet (5 mg total) by mouth every eight (8) hours as needed for pain.      OXYGEN -AIR DELIVERY SYSTEMS MISC 4 L by Miscellaneous route continuous.      pantoprazole  (PROTONIX ) 20 MG tablet TAKE 1 TABLET(20 MG) BY MOUTH DAILY 90 tablet 3    rosuvastatin  (CRESTOR ) 5 MG tablet TAKE 1 TABLET EVERY OTHER DAY 45 tablet 3    spironolactone  (ALDACTONE ) 25 MG tablet Take 0.5 tablets (12.5 mg total) by mouth daily. 30 tablet 2    tafamidis  (VYNDAMAX ) 61 mg cap Take 1 capsule (61 mg) by mouth daily. 30 capsule 11    tiotropium bromide  (SPIRIVA  RESPIMAT) 2.5 mcg/actuation inhalation mist Inhale 2 puffs daily. 4 g 11    torsemide  (DEMADEX ) 20 MG tablet Take 4 tablets (80 mg total) by mouth daily. 360 tablet 3    vutrisiran  (AMVUTTRA ) 25 mg/0.5 mL injection Inject 0.5 mL (25 mg total) under the skin once. Every 12 Weeks      blood sugar diagnostic (ACCU-CHEK GUIDE TEST STRIPS) Strp Use to check blood sugar daily 50 strip 3    HYDROcodone -chlorpheniramine  polistirex (TUSSIONEX PENNKINETIC ) 10-8 mg/5 mL ER suspension Take 5 mL by mouth every twelve (12) hours as needed for cough. 115 mL 0    inhalational spacing device (AEROCHAMBER MV) Spcr 1 spacer for inhaler 1 each 0    lancets (ACCU-CHEK SOFTCLIX LANCETS) Misc CHECK BLOOD SUGAR DAILY 200 each 11    lidocaine  (ASPERCREME) 4 % patch Place 1 patch on the skin daily. Remove after 12 hours. 30 patch 2     No current facility-administered medications for this visit.   [4]   Allergies  Allergen Reactions    Nitrofurantoin Anaphylaxis and Other (See Comments)    Lipitor  [Atorvastatin ] Muscle Pain     SOB, Headache, fatigue, sick on my stomach

## 2024-07-18 ENCOUNTER — Encounter: Admit: 2024-07-18 | Payer: Medicare (Managed Care)

## 2024-07-18 ENCOUNTER — Ambulatory Visit: Admit: 2024-07-18 | Discharge: 2024-07-19 | Payer: Medicare (Managed Care)

## 2024-07-18 ENCOUNTER — Encounter: Admit: 2024-07-18 | Discharge: 2024-07-19 | Payer: Medicare (Managed Care)

## 2024-07-18 DIAGNOSIS — I5032 Chronic diastolic (congestive) heart failure: Principal | ICD-10-CM

## 2024-07-18 LAB — BASIC METABOLIC PANEL
ANION GAP: 10 mmol/L (ref 5–14)
BLOOD UREA NITROGEN: 48 mg/dL — ABNORMAL HIGH (ref 9–23)
BUN / CREAT RATIO: 26
CALCIUM: 8.7 mg/dL (ref 8.7–10.4)
CHLORIDE: 100 mmol/L (ref 98–107)
CO2: 29.9 mmol/L (ref 20.0–31.0)
CREATININE: 1.85 mg/dL — ABNORMAL HIGH (ref 0.55–1.02)
EGFR CKD-EPI (2021) FEMALE: 27 mL/min/1.73m2 — ABNORMAL LOW (ref >=60)
GLUCOSE RANDOM: 131 mg/dL (ref 70–179)
POTASSIUM: 4.3 mmol/L (ref 3.4–4.8)
SODIUM: 140 mmol/L (ref 135–145)

## 2024-07-18 LAB — PRO-BNP: PRO-BNP: 4867 pg/mL — ABNORMAL HIGH (ref <=300.0)

## 2024-07-18 LAB — FERRITIN: FERRITIN: 90.3 ng/mL (ref 7.3–270.7)

## 2024-07-18 NOTE — Unmapped (Addendum)
 [name] Clinic Discharge Instructions:    MEDICATIONS:  NO medication changes today.  - You can take Torsemide  60mg  two times a day when you gain 2-3lbs overnight and/or experience increased shortness of breath and belly bloating.   - We discussed CardioMems again in the clinic today. Please think about it and let Damien know if you are interested.     Call if you have questions about your medications.    IMPORTANT INSTRUCTIONS:  - Weigh yourself daily in the morning; write your weight down and bring it with you next time   - Limit your fluid intake to 2 Liters (half-gallon) per day    - Limit your salt intake to 2-3 grams (2000-3000 mg) per day  - If you have a morning diuresis appointment, don't take your morning diuretic  - If you have an afternoon diuresis appointment, take your morning diuretic    LABS:  We will call you if your labs need attention    NEXT APPOINTMENT:  Return to clinic on 8/6 with Damien Lee, AGNP      My office number (c/o Elenor Mulch RN) is [phone] if you need further assistance, or need to schedule with our [name] clinic in the future.    If you need to reschedule future appointments, please call [phone].   After office hours, if you have urgent questions/problems, contact the on-call cardiologist through the hospital operator: [phone].    Please do not send a MyChart message for potentially life-threatening symptoms or time-sensitive issues. MyChart messages are not monitored after normal business hours or on weekends. Please call 911 for a true medical emergency.

## 2024-07-19 MED ORDER — SPIRIVA RESPIMAT 2.5 MCG/ACTUATION SOLUTION FOR INHALATION
Freq: Every day | RESPIRATORY_TRACT | 3 refills | 0.00000 days | Status: CP
Start: 2024-07-19 — End: 2025-07-19

## 2024-07-19 NOTE — Unmapped (Signed)
 [facility] Refill Coordination Note    Specialty Medication(s) to be Shipped:   Cardiology: Vyndamax     Other medication(s) to be shipped: No additional medications requested for fill at this time     Barbara Huber, DOB: 1940/12/31  Phone: [phone] (home)       All above HIPAA information was verified with patient.     Was a Nurse, learning disability used for this call? No    Completed refill call assessment today to schedule patient's medication shipment from the [facility] Delivery [facility]  [phone]).  All relevant notes have been reviewed.     Specialty medication(s) and dose(s) confirmed: Regimen is correct and unchanged.   Changes to medications: Barbara Huber reports no changes at this time.  Changes to insurance: No  New side effects reported not previously addressed with a pharmacist or physician: None reported  Questions for the pharmacist: No    Confirmed patient received a Conservation officer, historic buildings and a Surveyor, mining with first shipment. The patient will receive a drug information handout for each medication shipped and additional FDA Medication Guides as required.       DISEASE/MEDICATION-SPECIFIC INFORMATION        N/A    SPECIALTY MEDICATION ADHERENCE     Medication Adherence    Patient reported X missed doses in the last month: 0  Specialty Medication: VYNDAMAX  61 mg Cap (tafamidis )  Patient is on additional specialty medications: No  Patient is on more than two specialty medications: No  Any gaps in refill history greater than 2 weeks in the last 3 months: no  Demonstrates understanding of importance of adherence: yes              Were doses missed due to medication being on hold? No    VYNDAMAX  61  mg: 7 days of medicine on hand       REFERRAL TO PHARMACIST     Referral to the pharmacist: Not needed      [facility]     Shipping address confirmed in Epic.     Cost and Payment: Patient has a $0 copay, payment information is not required.    Delivery Scheduled: Yes, Expected medication delivery date: 07/25/24.     Medication will be delivered via Next Day Courier to the prescription address in Epic WAM.    Barbara Huber   [facility]  Specialty Technician

## 2024-07-20 DIAGNOSIS — E079 Disorder of thyroid, unspecified: Principal | ICD-10-CM

## 2024-07-20 DIAGNOSIS — J441 Chronic obstructive pulmonary disease with (acute) exacerbation: Principal | ICD-10-CM

## 2024-07-20 DIAGNOSIS — R339 Retention of urine, unspecified: Principal | ICD-10-CM

## 2024-07-20 DIAGNOSIS — E1122 Type 2 diabetes mellitus with diabetic chronic kidney disease: Principal | ICD-10-CM

## 2024-07-20 DIAGNOSIS — G629 Polyneuropathy, unspecified: Principal | ICD-10-CM

## 2024-07-20 DIAGNOSIS — E854 Organ-limited amyloidosis: Principal | ICD-10-CM

## 2024-07-20 DIAGNOSIS — Z7901 Long term (current) use of anticoagulants: Principal | ICD-10-CM

## 2024-07-20 DIAGNOSIS — I4819 Other persistent atrial fibrillation: Principal | ICD-10-CM

## 2024-07-20 DIAGNOSIS — Z9981 Dependence on supplemental oxygen: Principal | ICD-10-CM

## 2024-07-20 DIAGNOSIS — N184 Chronic kidney disease, stage 4 (severe): Principal | ICD-10-CM

## 2024-07-20 DIAGNOSIS — Z7951 Long term (current) use of inhaled steroids: Principal | ICD-10-CM

## 2024-07-20 DIAGNOSIS — I110 Hypertensive heart disease with heart failure: Principal | ICD-10-CM

## 2024-07-20 DIAGNOSIS — J9611 Chronic respiratory failure with hypoxia: Principal | ICD-10-CM

## 2024-07-20 DIAGNOSIS — I272 Pulmonary hypertension, unspecified: Principal | ICD-10-CM

## 2024-07-20 DIAGNOSIS — D631 Anemia in chronic kidney disease: Principal | ICD-10-CM

## 2024-07-20 DIAGNOSIS — I5032 Chronic diastolic (congestive) heart failure: Principal | ICD-10-CM

## 2024-07-20 DIAGNOSIS — J189 Pneumonia, unspecified organism: Principal | ICD-10-CM

## 2024-07-20 DIAGNOSIS — J9612 Chronic respiratory failure with hypercapnia: Principal | ICD-10-CM

## 2024-07-20 DIAGNOSIS — L989 Disorder of the skin and subcutaneous tissue, unspecified: Principal | ICD-10-CM

## 2024-07-20 DIAGNOSIS — G8929 Other chronic pain: Principal | ICD-10-CM

## 2024-07-20 DIAGNOSIS — G4733 Obstructive sleep apnea (adult) (pediatric): Principal | ICD-10-CM

## 2024-07-20 DIAGNOSIS — Z556 Problems related to health literacy: Principal | ICD-10-CM

## 2024-07-20 DIAGNOSIS — J440 Chronic obstructive pulmonary disease with acute lower respiratory infection: Principal | ICD-10-CM

## 2024-07-24 MED FILL — VYNDAMAX 61 MG CAPSULE: ORAL | 30 days supply | Qty: 30 | Fill #7

## 2024-07-25 DIAGNOSIS — Z7901 Long term (current) use of anticoagulants: Principal | ICD-10-CM

## 2024-07-25 DIAGNOSIS — D631 Anemia in chronic kidney disease: Principal | ICD-10-CM

## 2024-07-25 DIAGNOSIS — G4733 Obstructive sleep apnea (adult) (pediatric): Principal | ICD-10-CM

## 2024-07-25 DIAGNOSIS — I4819 Other persistent atrial fibrillation: Principal | ICD-10-CM

## 2024-07-25 DIAGNOSIS — I110 Hypertensive heart disease with heart failure: Principal | ICD-10-CM

## 2024-07-25 DIAGNOSIS — Z9981 Dependence on supplemental oxygen: Principal | ICD-10-CM

## 2024-07-25 DIAGNOSIS — E854 Organ-limited amyloidosis: Principal | ICD-10-CM

## 2024-07-25 DIAGNOSIS — J440 Chronic obstructive pulmonary disease with acute lower respiratory infection: Principal | ICD-10-CM

## 2024-07-25 DIAGNOSIS — I5032 Chronic diastolic (congestive) heart failure: Principal | ICD-10-CM

## 2024-07-25 DIAGNOSIS — Z7951 Long term (current) use of inhaled steroids: Principal | ICD-10-CM

## 2024-07-25 DIAGNOSIS — G8929 Other chronic pain: Principal | ICD-10-CM

## 2024-07-25 DIAGNOSIS — J189 Pneumonia, unspecified organism: Principal | ICD-10-CM

## 2024-07-25 DIAGNOSIS — J441 Chronic obstructive pulmonary disease with (acute) exacerbation: Principal | ICD-10-CM

## 2024-07-25 DIAGNOSIS — E1122 Type 2 diabetes mellitus with diabetic chronic kidney disease: Principal | ICD-10-CM

## 2024-07-25 DIAGNOSIS — N184 Chronic kidney disease, stage 4 (severe): Principal | ICD-10-CM

## 2024-07-25 DIAGNOSIS — J9612 Chronic respiratory failure with hypercapnia: Principal | ICD-10-CM

## 2024-07-25 DIAGNOSIS — E079 Disorder of thyroid, unspecified: Principal | ICD-10-CM

## 2024-07-25 DIAGNOSIS — J9611 Chronic respiratory failure with hypoxia: Principal | ICD-10-CM

## 2024-07-25 DIAGNOSIS — Z556 Problems related to health literacy: Principal | ICD-10-CM

## 2024-07-25 DIAGNOSIS — R339 Retention of urine, unspecified: Principal | ICD-10-CM

## 2024-07-25 DIAGNOSIS — I272 Pulmonary hypertension, unspecified: Principal | ICD-10-CM

## 2024-07-25 DIAGNOSIS — L989 Disorder of the skin and subcutaneous tissue, unspecified: Principal | ICD-10-CM

## 2024-07-25 DIAGNOSIS — G629 Polyneuropathy, unspecified: Principal | ICD-10-CM

## 2024-07-26 DIAGNOSIS — I110 Hypertensive heart disease with heart failure: Principal | ICD-10-CM

## 2024-07-26 DIAGNOSIS — J441 Chronic obstructive pulmonary disease with (acute) exacerbation: Principal | ICD-10-CM

## 2024-07-26 DIAGNOSIS — Z7951 Long term (current) use of inhaled steroids: Principal | ICD-10-CM

## 2024-07-26 DIAGNOSIS — Z9981 Dependence on supplemental oxygen: Principal | ICD-10-CM

## 2024-07-26 DIAGNOSIS — I4819 Other persistent atrial fibrillation: Principal | ICD-10-CM

## 2024-07-26 DIAGNOSIS — I272 Pulmonary hypertension, unspecified: Principal | ICD-10-CM

## 2024-07-26 DIAGNOSIS — D631 Anemia in chronic kidney disease: Principal | ICD-10-CM

## 2024-07-26 DIAGNOSIS — I5032 Chronic diastolic (congestive) heart failure: Principal | ICD-10-CM

## 2024-07-26 DIAGNOSIS — G8929 Other chronic pain: Principal | ICD-10-CM

## 2024-07-26 DIAGNOSIS — R339 Retention of urine, unspecified: Principal | ICD-10-CM

## 2024-07-26 DIAGNOSIS — J440 Chronic obstructive pulmonary disease with acute lower respiratory infection: Principal | ICD-10-CM

## 2024-07-26 DIAGNOSIS — E1122 Type 2 diabetes mellitus with diabetic chronic kidney disease: Principal | ICD-10-CM

## 2024-07-26 DIAGNOSIS — J189 Pneumonia, unspecified organism: Principal | ICD-10-CM

## 2024-07-26 DIAGNOSIS — G629 Polyneuropathy, unspecified: Principal | ICD-10-CM

## 2024-07-26 DIAGNOSIS — Z7901 Long term (current) use of anticoagulants: Principal | ICD-10-CM

## 2024-07-26 DIAGNOSIS — J9611 Chronic respiratory failure with hypoxia: Principal | ICD-10-CM

## 2024-07-26 DIAGNOSIS — L989 Disorder of the skin and subcutaneous tissue, unspecified: Principal | ICD-10-CM

## 2024-07-26 DIAGNOSIS — E854 Organ-limited amyloidosis: Principal | ICD-10-CM

## 2024-07-26 DIAGNOSIS — J9612 Chronic respiratory failure with hypercapnia: Principal | ICD-10-CM

## 2024-07-26 DIAGNOSIS — N184 Chronic kidney disease, stage 4 (severe): Principal | ICD-10-CM

## 2024-07-26 DIAGNOSIS — Z556 Problems related to health literacy: Principal | ICD-10-CM

## 2024-07-26 DIAGNOSIS — G4733 Obstructive sleep apnea (adult) (pediatric): Principal | ICD-10-CM

## 2024-07-26 DIAGNOSIS — E079 Disorder of thyroid, unspecified: Principal | ICD-10-CM

## 2024-07-29 ENCOUNTER — Encounter
Admit: 2024-07-29 | Discharge: 2024-07-31 | Disposition: A | Discharge status: Home Health | Payer: Medicare (Managed Care) | Attending: Emergency Medicine

## 2024-07-29 ENCOUNTER — Ambulatory Visit
Admit: 2024-07-29 | Discharge: 2024-07-31 | Disposition: A | Discharge status: Home Health | Payer: Medicare (Managed Care)

## 2024-07-29 ENCOUNTER — Inpatient Hospital Stay
Admit: 2024-07-29 | Discharge: 2024-07-31 | Disposition: A | Discharge status: Home Health | Payer: Medicare (Managed Care)

## 2024-07-29 ENCOUNTER — Encounter
Admit: 2024-07-29 | Discharge: 2024-07-31 | Disposition: A | Discharge status: Home Health | Payer: Medicare (Managed Care) | Attending: Student in an Organized Health Care Education/Training Program

## 2024-07-29 LAB — BASIC METABOLIC PANEL
ANION GAP: 15 mmol/L — ABNORMAL HIGH (ref 5–14)
BLOOD UREA NITROGEN: 43 mg/dL — ABNORMAL HIGH (ref 9–23)
BUN / CREAT RATIO: 20
CALCIUM: 8.7 mg/dL (ref 8.7–10.4)
CHLORIDE: 96 mmol/L — ABNORMAL LOW (ref 98–107)
CO2: 26.7 mmol/L (ref 20.0–31.0)
CREATININE: 2.13 mg/dL — ABNORMAL HIGH (ref 0.55–1.02)
EGFR CKD-EPI (2021) FEMALE: 23 mL/min/1.73m2 — ABNORMAL LOW (ref >=60)
GLUCOSE RANDOM: 99 mg/dL (ref 70–179)
POTASSIUM: 4.5 mmol/L (ref 3.4–4.8)
SODIUM: 138 mmol/L (ref 135–145)

## 2024-07-29 LAB — CBC W/ AUTO DIFF
BASOPHILS ABSOLUTE COUNT: 0.1 10*9/L (ref 0.0–0.1)
BASOPHILS RELATIVE PERCENT: 0.8 %
EOSINOPHILS ABSOLUTE COUNT: 0 10*9/L (ref 0.0–0.5)
EOSINOPHILS RELATIVE PERCENT: 0.1 %
HEMATOCRIT: 33 % — ABNORMAL LOW (ref 34.0–44.0)
HEMOGLOBIN: 10.9 g/dL — ABNORMAL LOW (ref 11.3–14.9)
LYMPHOCYTES ABSOLUTE COUNT: 1.2 10*9/L (ref 1.1–3.6)
LYMPHOCYTES RELATIVE PERCENT: 10.2 %
MEAN CORPUSCULAR HEMOGLOBIN CONC: 33 g/dL (ref 32.0–36.0)
MEAN CORPUSCULAR HEMOGLOBIN: 30.2 pg (ref 25.9–32.4)
MEAN CORPUSCULAR VOLUME: 91.6 fL (ref 77.6–95.7)
MEAN PLATELET VOLUME: 8.9 fL (ref 6.8–10.7)
MONOCYTES ABSOLUTE COUNT: 1.1 10*9/L — ABNORMAL HIGH (ref 0.3–0.8)
MONOCYTES RELATIVE PERCENT: 9.8 %
NEUTROPHILS ABSOLUTE COUNT: 9.2 10*9/L — ABNORMAL HIGH (ref 1.8–7.8)
NEUTROPHILS RELATIVE PERCENT: 79.1 %
NUCLEATED RED BLOOD CELLS: 0 /100{WBCs} (ref <=4)
PLATELET COUNT: 208 10*9/L (ref 150–450)
RED BLOOD CELL COUNT: 3.6 10*12/L — ABNORMAL LOW (ref 3.95–5.13)
RED CELL DISTRIBUTION WIDTH: 14 % (ref 12.2–15.2)
WBC ADJUSTED: 11.6 10*9/L — ABNORMAL HIGH (ref 3.6–11.2)

## 2024-07-29 LAB — HIGH SENSITIVITY TROPONIN I - SINGLE: HIGH SENSITIVITY TROPONIN I: 58 ng/L (ref <=34)

## 2024-07-29 LAB — PRO-BNP: PRO-BNP: 6303 pg/mL — ABNORMAL HIGH (ref <=300.0)

## 2024-07-29 MED ADMIN — acetaminophen (TYLENOL) tablet 1,000 mg: 1000 mg | ORAL | Stop: 2024-07-29

## 2024-07-29 NOTE — Unmapped (Signed)
 [facility]  Emergency Department Provider Note      ED Clinical Impression      Final diagnoses:   Acute on chronic congestive heart failure, unspecified heart failure type    (Primary)   Acute on chronic hypoxic respiratory failure      Acute on chronic diastolic congestive heart failure               Impression, Medical Decision Making, Progress Notes and Critical Care      Impression, Differential Diagnosis and Plan of Care    Barbara Huber is a 83 y.o. female with a past medical history of pulmonary HTN, COPD, chronic hypoxemic respiratory failure on 3 L [place], GERD, stage IV/V [name], [name], and persistent A-fib (on amiodarone ) presenting to the ED for weakness, BLE edema, and hypoxia.     On exam the patient is non-toxic appearing and in NAD. Vital signs are within normal limits, hemodynamically stable, and afebrile. LCTAB. HRRR. Physical exam is notable for bilateral bronchi in all fields. No wheezing. Trace 1+ pitting edema to the knee.     Differential includes CHF exacerbation versus ACS versus worsening kidney disease versus toxicity related to amiodarone  use versus pneumonia versus viral illness.    Plan for basic labs, hsTroponin I, Pro-BNP, FLU/RSV/COVID, EKG, and CXR. Will give 1000 mg Tylenol .    Independent Interpretation of Studies: I have independently viewed EKG and note normal sinus rhythm at 91, normal axis, normal interval, no STEMI.  External Records Reviewed: Patient's most recent discharge summary7/06/2024- [facility]- For review of patient's past medical history and cart review.   Social determinants that significantly affected care: None applicable  History obtained from other sources: None    Additional Progress Notes    9:55 PM: Chest x-ray shows pulmonary edema with small bilateral pleural effusions.  COVID swab is negative.  BNP is significantly elevated with mild troponin elevation.  I strongly suspect CHF exacerbation and given increasing oxygen  requirement, feel that the patient would benefit from admission.  IV Lasix  given.  Plain paged MAO for CHF exacerbation with increased oxygen  requirement.     Portions of this record have been created using Scientist, clinical (histocompatibility and immunogenetics). Dictation errors have been sought, but may not have been identified and corrected.    See chart and resident provider documentation for details.    ____________________________________________         History        Reason for Visit  Shortness of Breath      HPI   Barbara Huber is a 83 y.o. female with a past medical history of pulmonary HTN, COPD, chronic hypoxemic respiratory failure on 3 L [place], GERD, stage IV/V [name], [name], and persistent A-fib (on amiodarone ) presenting to the ED for shortness of breath. Per family at bedside, the patient reports weakness, BLE edema, and hypoxia. She states that earlier today she was feeling weakness in her BLE and was hypoxic to 88% (on 3 L [place]). Upon evaluation, she endorses a productive cough and worsening dyspnea on exertion. Further, she notes that her HR was tachycardic to 101 BPM today and 104 BPM last night. Family at bedside notes that her cough has been persistent since a pneumonia infection in January. No changes in weight. Denies fevers.     Per chart review, the patient was admitted to [facility] on 06/12/24-06/20/24 for COPD exacerbation with an associated persistent wet cough that improved with antibiotics, airway clearance, and supportive care. The antibiotics were  stopped shortly after there was low suspicion for an infection. The patient was discharged to continue home oxygen  of 3 L [place] and duonebs QID. The patient was admitted to [facility] from 07/03/24-07/05/24 for persistent A-fib with associated weakness. Labs were notable for Cr 2.86 which was increased from the patient's baseline (1.7-2.3) and consistent with mild AKI. BUN was elevated to 89. She appeared to be at her dry weight on evaluation. It was noted that she received 250 mL IV fluid bolus and 40 IV lasix  due to suspected AKI and dehydration. She was discharged with instructions to continue her home medications without any changes.       Past Medical History[1]    Problem List[2]    Past Surgical History[3]    No current facility-administered medications for this encounter.    Current Outpatient Medications:     acetaminophen  (TYLENOL ) 500 MG tablet, Take 2 tablets (1,000 mg total) by mouth Three (3) times a day as needed for pain., Disp: , Rfl:     albuterol  2.5 mg /3 mL (0.083 %) nebulizer solution, Inhale 3 mL (2.5 mg total) by nebulization every four (4) hours as needed for wheezing., Disp: 90 mL, Rfl: 2    albuterol  HFA 90 mcg/actuation inhaler, Inhale 1 puff every six (6) hours as needed for wheezing., Disp: 8 g, Rfl: 3    amiodarone  (PACERONE ) 200 MG tablet, Take 1 tablet (200 mg total) by mouth daily., Disp: 30 tablet, Rfl: 11    blood sugar diagnostic (ACCU-CHEK GUIDE TEST STRIPS) Strp, Use to check blood sugar daily, Disp: 50 strip, Rfl: 3    cranberry 500 mg cap, Take 500 mg by mouth daily with evening meal., Disp: , Rfl:     cyclobenzaprine  (FLEXERIL ) 10 MG tablet, Take 1 tablet (10 mg total) by mouth two (2) times a day as needed for muscle spasms., Disp: 60 tablet, Rfl: 11    ergocalciferol -1,250 mcg, 50,000 unit, (DRISDOL ) 1,250 mcg (50,000 unit) capsule, TAKE 1 CAPSULE ONCE A WEEK, Disp: 12 capsule, Rfl: 0    ferrous sulfate  (FEROSUL) 325 (65 FE) MG tablet, Take 1 tablet (325 mg total) by mouth in the morning. (Patient taking differently: Take 1 tablet by mouth daily. Taking Monday, Wednesday, Friday), Disp: 90 tablet, Rfl: 1    fluticasone  propion-salmeterol (ADVAIR  HFA) 115-21 mcg/actuation inhaler, INHALE 2 PUFFS TWICE A DAY, Disp: 12 g, Rfl: 5    fosfomycin (MONUROL ) 3 gram Pack, Take 3 g by mouth once a week., Disp: 4 packet, Rfl: 6    gabapentin  (NEURONTIN ) 100 MG capsule, Take 1 capsule (100 mg total) by mouth two (2) times a day., Disp: 60 capsule, Rfl: 11    HYDROcodone -chlorpheniramine  polistirex (TUSSIONEX PENNKINETIC ) 10-8 mg/5 mL ER suspension, Take 5 mL by mouth every twelve (12) hours as needed for cough., Disp: 115 mL, Rfl: 0    inhalational spacing device (AEROCHAMBER MV) Spcr, 1 spacer for inhaler, Disp: 1 each, Rfl: 0    lancets (ACCU-CHEK SOFTCLIX LANCETS) Misc, CHECK BLOOD SUGAR DAILY, Disp: 200 each, Rfl: 11    lidocaine  (ASPERCREME) 4 % patch, Place 1 patch on the skin daily. Remove after 12 hours., Disp: 30 patch, Rfl: 2    midodrine  (PROAMATINE ) 5 MG tablet, Take 1 tablet (5 mg total) by mouth three (3) times a day., Disp: 90 tablet, Rfl: 11    NARCAN 4 mg/actuation nasal spray, 1 spray into alternating nostrils once as needed (opioid overdose). PRN - Emergency use., Disp: , Rfl:  nystatin  (MYCOSTATIN ) 100,000 unit/gram powder, Apply to affected area 3 times daily, Disp: 30 g, Rfl: 1    oxyCODONE  (ROXICODONE ) 5 MG immediate release tablet, Take 1 tablet (5 mg total) by mouth every eight (8) hours as needed for pain., Disp: , Rfl:     OXYGEN -AIR DELIVERY SYSTEMS MISC, 4 L by Miscellaneous route continuous., Disp: , Rfl:     pantoprazole  (PROTONIX ) 20 MG tablet, TAKE 1 TABLET(20 MG) BY MOUTH DAILY, Disp: 90 tablet, Rfl: 3    rosuvastatin  (CRESTOR ) 5 MG tablet, TAKE 1 TABLET EVERY OTHER DAY, Disp: 45 tablet, Rfl: 3    spironolactone  (ALDACTONE ) 25 MG tablet, Take 0.5 tablets (12.5 mg total) by mouth daily. (Patient taking differently: Take 25 mg by mouth daily.), Disp: 30 tablet, Rfl: 2    tafamidis  (VYNDAMAX ) 61 mg cap, Take 1 capsule (61 mg) by mouth daily., Disp: 30 capsule, Rfl: 11    tiotropium bromide  (SPIRIVA  RESPIMAT) 2.5 mcg/actuation inhalation mist, Inhale 2 puffs daily., Disp: 4 g, Rfl: 3    tiotropium bromide  (SPIRIVA  RESPIMAT) 2.5 mcg/actuation inhalation mist, Inhale 2 puffs in the morning., Disp: , Rfl:     torsemide  (DEMADEX ) 20 MG tablet, Take 4 tablets (80 mg total) by mouth daily., Disp: 360 tablet, Rfl: 3    vitamin A -3,000 mcg RAE, 10,000 UNIT, 3,000 mcg RAE (10,000 UNIT) capsule, Take 3,000 mcg of RAE by mouth daily., Disp: , Rfl:     vutrisiran  (AMVUTTRA ) 25 mg/0.5 mL injection, Inject 0.5 mL (25 mg total) under the skin once. Every 12 Weeks, Disp: , Rfl:     Allergies  Nitrofurantoin and Lipitor  [atorvastatin ]    Family History[4]    Social History  Short Social History[5]       Physical Exam     ED Triage Vitals [07/29/24 1715]   Enc Vitals Group      BP 110/67      Pulse 95      SpO2 Pulse       Resp 18      Temp 36.6 ??C (97.8 ??F)      Temp Source Temporal      SpO2 94 %      Weight 78 kg (171 lb 15.3 oz)     Constitutional: Alert and oriented. Well appearing and in no distress.  Eyes: Conjunctivae are normal.  ENT       Head: Normocephalic and atraumatic.       Nose: No congestion.       Mouth/Throat: Mucous membranes are moist.       Neck: No stridor.  Hematological/Lymphatic/Immunilogical: No cervical lymphadenopathy.  Cardiovascular: Normal rate, regular rhythm. Normal and symmetric distal pulses are present in all extremities.  Respiratory: Bilateral bronchi in all fields. No wheezing.   Gastrointestinal: Soft and nontender. There is no CVA tenderness.  Genitourinary: Deferred.   Musculoskeletal: Normal range of motion in all extremities.Trace 1+ pitting edema up to the knee.        Right lower leg: No tenderness or edema.       Left lower leg: No tenderness or edema.  Neurologic: Normal speech and language. No gross focal neurologic deficits are appreciated.  Skin: Skin is warm, dry and intact. No rash noted.  Psychiatric: Mood and affect are normal. Speech and behavior are normal.       Radiology     XR Chest 2 views   Final Result      1.  Pulmonary edema with small bilateral pleural effusions.  2.  Bibasilar hazy opacities may represent atelectasis or pneumonitis.                   Documentation assistance was provided by Vernell Derry, Scribe on July 29, 2024 at 8:15 PM for Hoy Slade, MD.    August 04, 2024 11:00 PM. Documentation assistance provided by the scribe. I was present during the time the encounter was recorded. The information recorded by the scribe was done at my direction and has been reviewed and validated by me.            [1]   Past Medical History:  Diagnosis Date    Acute exacerbation of CHF (congestive heart failure)    08/19/2023    Acute kidney injury superimposed on chronic kidney disease 10/11/2020    Acute on chronic diastolic (congestive) heart failure    08/23/2020    Acute on chronic diastolic congestive heart failure    08/23/2020    AKI (acute kidney injury) 04/14/2015    Lab Results  Component  Value  Date     CREATININE  1.90 (H)  06/12/2021     Had a bump in her creatinine when she was taking her diuretics every day.  She is currently taking 40 mg daily of torsemide  and 50 mg of spironolactone .  Her volume status is fragile.  Previously when she stopped her diuretic she becomes short of breath.  Plan: We will check her BMP today.  We will likely have to go to 40    Anemia 08/22/2019    Iron  deficiency anemia          Lab Results      Component    Value    Date           WBC    9.2    03/21/2021           RBC    3.67 (L)    03/21/2021           HGB    9.9 (L)    03/21/2021           HCT    30.7 (L)    03/21/2021           MCV    83.7    03/21/2021           MCH    26.9    03/21/2021           MCHC    32.1    03/21/2021           RDW    21.9 (H)    03/21/2021           PLT    318     Arthritis     Calculus of kidney     Calculus of ureter     CHF (congestive heart failure)        Chronic atrial fibrillation    07/20/2019    COPD (chronic obstructive pulmonary disease)        Diabetes        Gangrenous cholecystitis 10/11/2020    Generalized edema  06/17/2021    GERD (gastroesophageal reflux disease)     Hydronephrosis     Hypertension     Hyponatremia 10/11/2020    Intermediate coronary syndrome    03/13/2014    Lower extremity edema 09/28/2020    Lumbar stenosis     Microscopic hematuria     Nausea alone Nephrolithiasis  04/17/2016    Neuropathy     Nocturia     Nocturia 07/01/2017    Other chronic cystitis     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  See plans for anemia, AKI  We are also tapering her gabapentin .  Currently on 300 mg 3 times daily.  We will decrease it to twice daily, and then nightly.  She states she is not having any recurrence of her pain.    Pulmonary hypertension        Renal colic     Shortness of breath 04/28/2020    Sleep apnea     Unstable angina pectoris    03/13/2014   [2]   Patient Active Problem List  Diagnosis    Spinal stenosis of lumbar region    Thoracic or lumbosacral neuritis or radiculitis    Lumbosacral spondylosis    COPD (chronic obstructive pulmonary disease)       Sleep apnea in adult    Essential hypertension    Type 2 diabetes mellitus with stage 4 chronic kidney disease, with long-term current use of insulin        Chronic cystitis    Incomplete bladder emptying    Peripheral neuropathy    Chronic respiratory failure with hypoxia       Persistent atrial fibrillation       Anemia in chronic kidney disease    Chronic kidney disease ([name]), stage IV (severe)       Pulmonary hypertension       Chronic prescription opiate use    Bronchitis    Chronic heart failure with preserved ejection fraction       Cardiac amyloidosis       Recurrent cold sores    Dyspepsia    Iron  deficiency anemia due to chronic blood loss    Neuropathy    Left hip pain    Familial amyloid heart disease       Hereditary amyloidosis       Osteoporosis with current pathological fracture    Troponin level elevated    Thrombocytopenia    Difficulty walking    Pneumonia due to infectious organism    Dyspnea   [3]   Past Surgical History:  Procedure Laterality Date    BACK SURGERY  1995    CARPAL TUNNEL RELEASE Left 2014    HYSTERECTOMY  1971    IR INSERT CHOLECYSTOSTOMY TUBE PERCUTANEOUS  10/02/2020    IR INSERT CHOLECYSTOSMY TUBE PERCUTANEOUS 10/02/2020 Ivonne Butler Seed, MD IMG [facility]IR [facility]    LUMBAR DISC SURGERY      PR REMOVAL GALLBLADDER N/A 10/06/2020    Procedure: CHOLECYSTECTOMY;  Surgeon: Curtistine Mira Dunnings, MD;  Location: MAIN OR [facility];  Service: Trauma    PR RIGHT HEART CATH O2 SATURATION & CARDIAC OUTPUT N/A 09/30/2020    Procedure: Right Heart Catheterization;  Surgeon: Reyes Jerilynn Sage, MD;  Location: [facility] CATH;  Service: Cardiology    PR RIGHT HEART CATH O2 SATURATION & CARDIAC OUTPUT N/A 01/09/2021    Procedure: Right Heart Catheterization;  Surgeon: Norleen Deward Grumbling, MD;  Location: [facility] CATH;  Service: Cardiology   [4]   Family History  Problem Relation Age of Onset    Hypertension Mother     Cancer Father         COLON CANCER    No Known Problems Sister     No Known Problems Sister     No Known Problems Brother  No Known Problems Brother     No Known Problems Maternal Aunt     No Known Problems Maternal Uncle     No Known Problems Paternal Aunt     No Known Problems Paternal Uncle     No Known Problems Maternal Grandmother     No Known Problems Maternal Grandfather     No Known Problems Paternal Grandmother     No Known Problems Paternal Grandfather     No Known Problems Other     Anesthesia problems Neg Hx     Broken bones Neg Hx     Clotting disorder Neg Hx     Collagen disease Neg Hx     Diabetes Neg Hx     Dislocations Neg Hx     Fibromyalgia Neg Hx     Gout Neg Hx     Hemophilia Neg Hx     Osteoporosis Neg Hx     Rheumatologic disease Neg Hx     Scoliosis Neg Hx     Severe sprains Neg Hx     Sickle cell anemia Neg Hx     Spinal Compression Fracture Neg Hx     GU problems Neg Hx     Kidney cancer Neg Hx     Prostate cancer Neg Hx    [5]   Social History  Tobacco Use    Smoking status: Former     Current packs/day: 0.00     Types: Cigarettes     Quit date: 2019     Years since quitting: 6.5    Smokeless tobacco: Never    Tobacco comments:     Quit a few years ago   Vaping Use    Vaping status: Never Used   Substance Use Topics    Alcohol use: No    Drug use: No        Billy Hoy Cutting, MD  08/04/24 2302

## 2024-07-29 NOTE — Unmapped (Addendum)
 Barbara Huber is a 83 y.o. 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 She was 3-5 lbs above her dry weight with admission, gave two doses of IV lasix  with robust urine output. On day of discharge she was her dry weight of 169 lbs, and discharged on her home torsemide  dose. She has follow up with cardiology this week and PCP next week.    Active Problems     [name] exacerbation - Acute on chronic hypoxic respiratory failure   Patient presents with generalized weakness, dyspnea and hypoxia (SpO2 in the 80s at home), increased O2 requirement from 3L to 4L, a feeling of tightness in her belly and BLE edema.  Most recent echo (06/14/24) showed LVEF 60-65%, moderately increased LV wall thickness, moderate mitral annular calcification, moderate pulmonary hypertension. Dry weight ~169 lbs, she is 171 lbs on admission. Pro-BNP 6303. Pulmonary edema on CXR. Obtained PVLs to rule out DVT for asymmetric swelling which was negative for DVT. Deffered CTA for now given stable vitals, no hypoxia and her underlying [name]. Achieved her dry weight on day 2 of hospital admission and was euvolemic and discharged on home torsemide .       Productive cough - COPD - Pulmonary hypertension  Patient reports she has had a productive cough since being admitted in June for a COPD exacerbation, however this has remained stable and no increased sputum production. She continues to have a cough but we suspect it is related to her recent illness. She is back on her home oxygen  of 3L and was discharged on home medications.       Leukocytosis  Suspect reactive ISO heart failure exacerbation. No localizing signs of infection        Iron  deficiency anemia  Hb 10.9 on admission.  She has had persistent iron  deficiency anemia despite oral iron  supplementation every other day. Was started on IV iron  in hospital for total of 1250 mg.

## 2024-07-29 NOTE — Unmapped (Signed)
 Pt brought in by daughter. Pt complains of being SOB, normally wears 3L of oxygen  but had to be increased to 4L.    Per daughter, when the pt was complaining of SOB and fatigue, she checked her oxygen  and it was 80s. Daughter also reports a cough

## 2024-07-29 NOTE — Unmapped (Signed)
 Internal Medicine (MEDM) History & Physical    Assessment & Plan:   Barbara Huber is a 83 y.o. 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     Principal Problem:    Acute exacerbation of CHF (congestive heart failure)     Active Problems:    COPD (chronic obstructive pulmonary disease)       Atrial fibrillation       Chronic kidney disease ([name]), stage IV (severe)       Pulmonary hypertension       Chronic prescription opiate use    Chronic heart failure with preserved ejection fraction       Cardiac amyloidosis       Dyspepsia    Iron  deficiency anemia due to chronic blood loss    Hereditary amyloidosis       Acute on chronic hypoxic respiratory failure       Leukocytosis    Active Problems    [name] exacerbation - Acute on chronic hypoxic respiratory failure   Patient presents with generalized weakness, dyspnea and hypoxia (SpO2 in the 80s at home), increased O2 requirement from 3L to 4L, a feeling of tightness in her belly and BLE edema. She reports she has been eating a lot of watermelon. Most recent echo (06/14/24) showed LVEF 60-65%, moderately increased LV wall thickness, moderate mitral annular calcification, moderate pulmonary hypertension. Dry weight ~169 lbs, she is 171 lbs on admission. Pro-BNP 6303. Exacerbation likely triggered by volume overload given her pulmonary edema on CXR, BLE edema, and history. Also consider PE as she stopped Eliquis  2 weeks ago per her PCP's recommendation due to iron  deficiency anemia not responding to oral iron , however I will defer CTA for now due to her [name]. Consider COPD exacerbation given her productive cough, but it has been stable and she has no wheezing on exam.  - S/p IV Lasix  80 mg x1 in the ED with no urine output after 4 hours  - Will redose with IV Lasix  160 mg x1  - Continue home spironolactone  12.5 mg daily  - Hold home torsemide  80 mg daily  - Consider repeat echo   - If not improving with diuresis, consider CTA chest for PE, PVLs, or empirically putting back on Eliquis   - Continue to monitor BMP, Mg  - Maintain K >4 and Mg >2  - Strict I/Os, daily weights  - PT/OT consult  - when euvolemic     Productive cough - COPD - Pulmonary hypertension  Patient reports she has had a productive cough since being admitted in June for a COPD exacerbation, however this has remained stable and no increased sputum production. Currently I think her dyspnea is more due to [name] exacerbation rather than COPD, so I will defer steroids and antibiotics for now.   - Continue formulary equivalent of home Advair  and Spiriva  inhalers  - Continue home supplemental O2, wean as tolerated  - Scheduled guaifenesin  400 mg q6h, PRN dextromethorphan  qHS   - Outpatient follow up with Dr. Janie     Leukocytosis  Suspect reactive ISO heart failure exacerbation. Currently no localizing signs of infection - afebrile, no urinary symptoms, productive cough is at baseline.  - Continue to monitor CBC    Iron  deficiency anemia  Hb 10.9 on  admission.  She has had persistent iron  deficiency anemia despite oral iron  supplementation every other day.  2 weeks ago her PCP told her to increase the frequency of her iron  to every day, and to stop taking Eliquis .  Repeat ferritin today is still <100, so she would benefit from IV iron  given her heart failure.  Her iron  deficit based on the [place] equation is approximately 1250 mg.  I do wonder if she is not absorbing oral iron  well, possibly due to gut edema secondary to her heart failure and pulmonary hypertension, as she did endorse the feeling of belly tightness today. B12 is borderline low, so will start supplementation.  - Ferrlecit  250 mg q12h x 5 doses  - Hold home oral iron  supplements  - Start B12 1000 mcg daily    Chronic Problems    Hereditary cardiac amyloidosis  Follows with cardiology NP Damien Lee.  - Continue home Vyndamax  (tafamidis ) 61 mg daily (patient supplied)  - Also getting Amvuttra  outpatient q3 months    [name]  Creatinine 2.13 on admission. Baseline Cr 1.7-2.3.  - Cystatin C pending, as an additional estimate of GFR  - Continue to monitor BMP, Mg, phos  - Avoid nephrotoxins (NSAIDs, contrasted studies, etc.)  - Renally dose medications  - Strict I/Os, daily weights    Atrial fibrillation  - Continue home amiodarone  200 mg daily  - Patient was told to stop her Eliquis  by her PCP on 07/12/24     Chronic hypotension  - Continue home midodrine  5 mg TID     Hyperlipidemia  - Hold home rosuvastatin  5 mg every other day (non-formulary and patient doesn't tolerate atorvastatin )    History of diabetes mellitus, resolved  A1c 4.8% 03/03/24.    Hyperthyroidism, resolved  S/p methimazole  therapy. TSH WNL.    Chronic back pain  - Continue home oxycodone  5 mg q8h PRN, lidocaine  patch daily, flexeril  10 mg BID PRN, gabapentin  100 mg BID     GERD  - Continue home pantoprazole  20 mg daily    History of UTI  - Continue home fosfomycin weekly to prevent UTI    Chronic constipation  - Continue Miralax  daily    Vitamin D  deficiency  - Hold home vitamin D  supplementation  - Vitamin D  level pending    The patient's presentation is complicated by the following clinically significant conditions requiring additional evaluation and treatment: - Hypercoagulable state requiring additional attention to DVT prophylaxis and treatment or chronic anticoagulation secondary to Atrial Fibrillation   - Chronic kidney disease POA requiring further investigation, treatment, or monitoring   - Age related debility POA requiring additional resources: DME, PT, or OT  - Hypoxia POA requiring further investigation, treatment, or monitoring  - Anemia requiring at least daily CBC for further monitoring     Issues Impacting Complexity of Management:  -Intensive monitoring of drug toxicity from Lasix  with scheduled BMP  -High risk of complications from pain and/or analgesia likely to result in delirium  -The patient is at high risk from Hospital immobility in an elderly patient given baseline poor functional status with a high risk of causing delirium and further decline in function  -Need for the following intensive monitoring parameter(s) due to high risk of clinical decline: continuous oxygen  monitoring, telemetry, and frequent monitoring of urine output to assess for the efficacy of diuretic regimen  -The patient is at high risk of complications from [name], respiratory failure    Medical Decision Making : Independently interpreted CXR notable for  pulmonary edema.    Checklist:  Diet: Regular Diet  DVT PPx: Heparin  5000units q8h  Code Status: Full Code  Dispo: Patient appropriate for Inpatient based on expectation of ongoing need for hospitalization greater than two midnights based on severity of presentation/services including [name] exacerbation    Team Contact Information:   Primary Team: Med M  Primary Resident: Merlynn OLEGARIO Lennox, MD  Resident's Pager: Med M resident    Chief Concern:   Acute exacerbation of CHF (congestive heart failure)       Subjective:   Barbara Huber is a 83 y.o. 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    HPI:  Today the patient started having generalized malaise, dyspnea on exertion, belly tightness, and like her legs were weak and rubbery. She checked her SpO2 and it was in the 80s, and increased her O2 from 3L baseline to 4L [place]. Endorses a productive cough since hospitalization for COPD exacerbation in June, but it has been stable, no increased sputum production. No chest pain, sick contacts, fever or chills, no urinary symptoms, no appetite change. She has been taking her torsemide  80 mg daily. 2 weeks ago her PCP took her off Eliquis  due to iron  deficiency anemia and had her increase her iron  supplementation from every other day to daily. No other medication changes.     On arrival to the ED, patient was afebrile, blood pressure 110/67, heart rate 95, respiratory rate 18, satting 94% on 3 L nasal cannula.  Labs were notable for WBC 11.6, ANC 9200, hemoglobin 10.9, platelets 208, sodium 138, potassium 4.5, creatinine 2.13, BUN 43, high-sensitivity troponin 58, proBNP 6303, TSH 4.18, negative COVID/flu/RSV.  Chest x-ray with pulmonary edema and small bilateral pleural effusions, bibasilar hazy opacities. EKG RRR.    Pertinent Surgical Hx  Past Surgical History[1]     Pertinent Family Hx  Family History[2]     Pertinent Social Hx   Social History[3]     Allergies  Nitrofurantoin and Lipitor  [atorvastatin ]    I reviewed the Medication List. The current list is Accurate  Prior to Admission medications   Medication Dose, Route, Frequency   acetaminophen  (TYLENOL ) 500 MG tablet 1,000 mg, Oral, 3 times a day PRN   albuterol  2.5 mg /3 mL (0.083 %) nebulizer solution 2.5 mg, Nebulization, Every 4 hours PRN   albuterol  HFA 90 mcg/actuation inhaler 1 puff, Inhalation, Every 6 hours PRN   amiodarone  (PACERONE ) 200 MG tablet 200 mg, Oral, Daily (standard)   blood sugar diagnostic (ACCU-CHEK GUIDE TEST STRIPS) Strp Use to check blood sugar daily   cranberry 500 mg cap 500 mg, Daily   cyclobenzaprine  (FLEXERIL ) 10 MG tablet 10 mg, Oral, 2 times a day PRN   ergocalciferol -1,250 mcg, 50,000 unit, (DRISDOL ) 1,250 mcg (50,000 unit) capsule TAKE 1 CAPSULE ONCE A WEEK   ferrous sulfate  (FEROSUL) 325 (65 FE) MG tablet 325 mg, Oral, Daily  Patient taking differently: Take 1 tablet by mouth daily. Taking Monday, Wednesday, Friday   fluticasone  propion-salmeterol (ADVAIR  HFA) 115-21 mcg/actuation inhaler INHALE 2 PUFFS TWICE A DAY   fosfomycin (MONUROL ) 3 gram Pack 3 g, Oral, Weekly   gabapentin  (NEURONTIN ) 100 MG capsule 100 mg, Oral, 2 times a day (standard)   HYDROcodone -chlorpheniramine  polistirex (TUSSIONEX PENNKINETIC ) 10-8 mg/5 mL ER suspension  5 mL, Oral, Every 12 hours PRN   inhalational spacing device (AEROCHAMBER MV) Spcr 1 spacer for inhaler   lancets (ACCU-CHEK SOFTCLIX LANCETS) Misc CHECK BLOOD SUGAR DAILY   lidocaine  (ASPERCREME) 4 % patch 1 patch, Transdermal, Daily (standard), Remove after 12 hours.   midodrine  (PROAMATINE ) 5 MG tablet 5 mg, Oral, 3 times a day   NARCAN 4 mg/actuation nasal spray 1 spray, Once as needed   nystatin  (MYCOSTATIN ) 100,000 unit/gram powder Apply to affected area 3 times daily   oxyCODONE  (ROXICODONE ) 5 MG immediate release tablet 5 mg, Every 8 hours PRN   OXYGEN -AIR DELIVERY SYSTEMS MISC 4 L, Continuous   pantoprazole  (PROTONIX ) 20 MG tablet 20 mg, Oral, Daily (standard), TAKE 1 TABLET(20 MG) BY MOUTH DAILY   rosuvastatin  (CRESTOR ) 5 MG tablet TAKE 1 TABLET EVERY OTHER DAY   spironolactone  (ALDACTONE ) 25 MG tablet 12.5 mg, Oral, Daily (standard)  Patient taking differently: Take 25 mg by mouth daily.   tafamidis  (VYNDAMAX ) 61 mg cap Take 1 capsule (61 mg) by mouth daily.   tiotropium bromide  (SPIRIVA  RESPIMAT) 2.5 mcg/actuation inhalation mist 2 puffs, Inhalation, Daily (standard)   tiotropium bromide  (SPIRIVA  RESPIMAT) 2.5 mcg/actuation inhalation mist 2 puffs, Inhalation, Daily (RT)   torsemide  (DEMADEX ) 20 MG tablet 80 mg, Oral, Daily (standard)   vitamin A -3,000 mcg RAE, 10,000 UNIT, 3,000 mcg RAE (10,000 UNIT) capsule 3,000 mcg of RAE, Oral, Daily (standard)   vutrisiran  (AMVUTTRA ) 25 mg/0.5 mL injection 25 mg, Once     [employer] Decision Maker:  Ms. Goshorn currently has decisional capacity for healthcare decision-making and is able to designate a surrogate healthcare decision maker. Ms. Santillano designated healthcare decision maker(s) is/are  [name],[name] (the patient's adult child) as denoted by stated patient preference.    Objective:   Physical Exam:  Temp:  [36.6 ??C (97.8 ??F)] 36.6 ??C (97.8 ??F)  Pulse:  [67-95] 67  SpO2 Pulse:  [69-82] 69  Resp:  [14-19] 16  BP: (105-116)/(47-67) 111/58  SpO2:  [93 %-98 %] 97 %    Gen: NAD, converses   Eyes: Sclera anicteric, EOMI grossly normal   HENT: Atraumatic, normocephalic  Neck: Trachea midline  Heart: RRR  Lungs: bilateral diffuse crackles  Abdomen: Soft, NTND  Extremities: BLE 2+ pitting edema  Neuro: Grossly symmetric, non-focal    Skin:  No rashes, lesions on clothed exam  Psych: Alert, oriented          [1]   Past Surgical History:  Procedure Laterality Date    BACK SURGERY  1995    CARPAL TUNNEL RELEASE Left 2014    HYSTERECTOMY  1971    IR INSERT CHOLECYSTOSTOMY TUBE PERCUTANEOUS  10/02/2020    IR INSERT CHOLECYSTOSMY TUBE PERCUTANEOUS 10/02/2020 Ivonne Butler Seed, MD IMG VIR H&V [facility]    LUMBAR DISC SURGERY      PR REMOVAL GALLBLADDER N/A 10/06/2020    Procedure: CHOLECYSTECTOMY;  Surgeon: Curtistine Mira Dunnings, MD;  Location: MAIN OR [facility];  Service: Trauma    PR RIGHT HEART CATH O2 SATURATION & CARDIAC OUTPUT N/A 09/30/2020    Procedure: Right Heart Catheterization;  Surgeon: Reyes Jerilynn Sage, MD;  Location: [facility] CATH;  Service: Cardiology    PR RIGHT HEART CATH O2 SATURATION & CARDIAC OUTPUT N/A 01/09/2021    Procedure: Right Heart Catheterization;  Surgeon: Norleen Deward Grumbling, MD;  Location: [facility] CATH;  Service: Cardiology   [2]   Family History  Problem Relation Age of Onset    Hypertension Mother  Cancer Father         COLON CANCER    No Known Problems Sister     No Known Problems Sister     No Known Problems Brother     No Known Problems Brother     No Known Problems Maternal Aunt     No Known Problems Maternal Uncle     No Known Problems Paternal Aunt     No Known Problems Paternal Uncle     No Known Problems Maternal Grandmother     No Known Problems Maternal Grandfather     No Known Problems Paternal Grandmother     No Known Problems Paternal Grandfather     No Known Problems Other     Anesthesia problems Neg Hx     Broken bones Neg Hx     Clotting disorder Neg Hx     Collagen disease Neg Hx     Diabetes Neg Hx     Dislocations Neg Hx     Fibromyalgia Neg Hx     Gout Neg Hx     Hemophilia Neg Hx     Osteoporosis Neg Hx     Rheumatologic disease Neg Hx     Scoliosis Neg Hx     Severe sprains Neg Hx     Sickle cell anemia Neg Hx     Spinal Compression Fracture Neg Hx     GU problems Neg Hx     Kidney cancer Neg Hx     Prostate cancer Neg Hx    [3]   Social History  Socioeconomic History    Marital status: Widowed   Occupational History    Occupation: retired   Tobacco Use    Smoking status: Former     Current packs/day: 0.00     Types: Cigarettes     Quit date: 2019     Years since quitting: 6.5    Smokeless tobacco: Never    Tobacco comments:     Quit a few years ago   Vaping Use    Vaping status: Never Used   Substance and Sexual Activity    Alcohol use: No    Drug use: No    Sexual activity: Not Currently   Other Topics Concern    Exercise No   Social History Narrative    Previously worked in Designer, fashion/clothing, retired.  Daughter is Economist who works at [employer].  Her niece is Luke, [facility] [employer] Anesthesia Tech.  Lives in [place] with daughter and son-in-law.     Social Drivers of Psychologist, prison and probation services Strain: Low Risk  (06/14/2024)    Overall Financial Resource Strain (CARDIA)     Difficulty of Paying Living Expenses: Not hard at all   Food Insecurity: No Food Insecurity (06/14/2024)    Hunger Vital Sign     Worried About Running Out of Food in the Last Year: Never true     Ran Out of Food in the Last Year: Never true   Transportation Needs: No Transportation Needs (06/14/2024)    PRAPARE - Therapist, art (Medical): No     Lack of Transportation (Non-Medical): No   Physical Activity: Unknown (11/04/2017) Received from [facility]    Exercise Vital Sign     Days of Exercise per Week: Patient declined     Minutes of Exercise per Session: Patient declined   Stress: Unknown (11/04/2017)    Received from [facility]    [employer] of Occupational Health - Occupational Stress Questionnaire     Feeling of Stress : Patient declined   Social Connections: Unknown (11/04/2017)    Received from [facility]    Social Connection and Isolation Panel     Frequency of Communication with Friends and Family: Patient declined     Frequency of Social Gatherings with Friends and Family: Patient declined     Attends Religious Services: Patient declined     Database administrator or Organizations: Patient declined     Attends Banker Meetings: Patient declined     Marital Status: Patient declined   Housing: Low Risk  (06/14/2024)    Housing     Within the past 12 months, have you ever stayed: outside, in a car, in a tent, in an overnight shelter, or temporarily in someone else's home (i.e. couch-surfing)?: No     Are you worried about losing your housing?: No

## 2024-07-30 LAB — CBC W/ AUTO DIFF
BASOPHILS ABSOLUTE COUNT: 0 10*9/L (ref 0.0–0.1)
BASOPHILS RELATIVE PERCENT: 0.5 %
EOSINOPHILS ABSOLUTE COUNT: 0.1 10*9/L (ref 0.0–0.5)
EOSINOPHILS RELATIVE PERCENT: 1 %
HEMATOCRIT: 31.4 % — ABNORMAL LOW (ref 34.0–44.0)
HEMOGLOBIN: 10.3 g/dL — ABNORMAL LOW (ref 11.3–14.9)
LYMPHOCYTES ABSOLUTE COUNT: 1.2 10*9/L (ref 1.1–3.6)
LYMPHOCYTES RELATIVE PERCENT: 13.3 %
MEAN CORPUSCULAR HEMOGLOBIN CONC: 32.7 g/dL (ref 32.0–36.0)
MEAN CORPUSCULAR HEMOGLOBIN: 30.2 pg (ref 25.9–32.4)
MEAN CORPUSCULAR VOLUME: 92.2 fL (ref 77.6–95.7)
MEAN PLATELET VOLUME: 9 fL (ref 6.8–10.7)
MONOCYTES ABSOLUTE COUNT: 1.1 10*9/L — ABNORMAL HIGH (ref 0.3–0.8)
MONOCYTES RELATIVE PERCENT: 12.5 %
NEUTROPHILS ABSOLUTE COUNT: 6.5 10*9/L (ref 1.8–7.8)
NEUTROPHILS RELATIVE PERCENT: 72.7 %
NUCLEATED RED BLOOD CELLS: 0 /100{WBCs} (ref <=4)
PLATELET COUNT: 168 10*9/L (ref 150–450)
RED BLOOD CELL COUNT: 3.4 10*12/L — ABNORMAL LOW (ref 3.95–5.13)
RED CELL DISTRIBUTION WIDTH: 14 % (ref 12.2–15.2)
WBC ADJUSTED: 8.9 10*9/L (ref 3.6–11.2)

## 2024-07-30 LAB — HEPATIC FUNCTION PANEL
ALBUMIN: 2.5 g/dL — ABNORMAL LOW (ref 3.4–5.0)
ALKALINE PHOSPHATASE: 121 U/L — ABNORMAL HIGH (ref 46–116)
ALT (SGPT): 10 U/L (ref 10–49)
AST (SGOT): 28 U/L (ref <=34)
BILIRUBIN DIRECT: 0.2 mg/dL (ref 0.00–0.30)
BILIRUBIN TOTAL: 0.4 mg/dL (ref 0.3–1.2)
PROTEIN TOTAL: 6.2 g/dL (ref 5.7–8.2)

## 2024-07-30 LAB — MAGNESIUM
MAGNESIUM: 2.3 mg/dL (ref 1.6–2.6)
MAGNESIUM: 2.4 mg/dL (ref 1.6–2.6)

## 2024-07-30 LAB — BASIC METABOLIC PANEL
ANION GAP: 13 mmol/L (ref 5–14)
ANION GAP: 14 mmol/L (ref 5–14)
BLOOD UREA NITROGEN: 46 mg/dL — ABNORMAL HIGH (ref 9–23)
BLOOD UREA NITROGEN: 45 mg/dL — ABNORMAL HIGH (ref 9–23)
BUN / CREAT RATIO: 21
BUN / CREAT RATIO: 21
CALCIUM: 8.8 mg/dL (ref 8.7–10.4)
CALCIUM: 9.1 mg/dL (ref 8.7–10.4)
CHLORIDE: 98 mmol/L (ref 98–107)
CHLORIDE: 97 mmol/L — ABNORMAL LOW (ref 98–107)
CO2: 29 mmol/L (ref 20.0–31.0)
CO2: 29.5 mmol/L (ref 20.0–31.0)
CREATININE: 2.18 mg/dL — ABNORMAL HIGH (ref 0.55–1.02)
CREATININE: 2.16 mg/dL — ABNORMAL HIGH (ref 0.55–1.02)
EGFR CKD-EPI (2021) FEMALE: 22 mL/min/1.73m2 — ABNORMAL LOW (ref >=60)
EGFR CKD-EPI (2021) FEMALE: 22 mL/min/1.73m2 — ABNORMAL LOW (ref >=60)
GLUCOSE RANDOM: 76 mg/dL (ref 70–179)
GLUCOSE RANDOM: 95 mg/dL (ref 70–179)
POTASSIUM: 4 mmol/L (ref 3.4–4.8)
POTASSIUM: 4.2 mmol/L (ref 3.4–4.8)
SODIUM: 140 mmol/L (ref 135–145)
SODIUM: 140 mmol/L (ref 135–145)

## 2024-07-30 LAB — FERRITIN: FERRITIN: 79 ng/mL (ref 7.3–270.7)

## 2024-07-30 LAB — CYSTATIN C
CYSTATIN C: 3.75 mg/L — ABNORMAL HIGH (ref 0.64–1.23)
EGFR CKD-EPI (2012) CYSTATIN C FEMALE: 11 mL/min/1.73m2 — ABNORMAL LOW (ref >=60)

## 2024-07-30 LAB — HIGH SENSITIVITY TROPONIN I - SINGLE
HIGH SENSITIVITY TROPONIN I: 71 ng/L (ref <=34)
HIGH SENSITIVITY TROPONIN I: 75 ng/L (ref <=34)

## 2024-07-30 LAB — PHOSPHORUS: PHOSPHORUS: 3.9 mg/dL (ref 2.4–5.1)

## 2024-07-30 LAB — TSH: THYROID STIMULATING HORMONE: 4.182 u[IU]/mL (ref 0.550–4.780)

## 2024-07-30 MED ORDER — ERGOCALCIFEROL (VITAMIN D2) 1,250 MCG (50,000 UNIT) CAPSULE
ORAL_CAPSULE | 0 refills | 0.00000 days
Start: 2024-07-30 — End: ?

## 2024-07-30 MED ADMIN — cyanocobalamin (vitamin B-12) tablet 1,000 mcg: 1000 ug | ORAL | @ 13:00:00

## 2024-07-30 MED ADMIN — midodrine (PROAMATINE) tablet 5 mg: 5 mg | ORAL | @ 18:00:00

## 2024-07-30 MED ADMIN — midodrine (PROAMATINE) tablet 5 mg: 5 mg | ORAL | @ 02:00:00 | Stop: 2024-07-29

## 2024-07-30 MED ADMIN — guaiFENesin (HUMIBID 3) tablet 400 mg: 400 mg | ORAL | @ 11:00:00

## 2024-07-30 MED ADMIN — furosemide (LASIX) 160 mg in sodium chloride (NS) 0.9 % 50 mL IVPB: 160 mg | INTRAVENOUS | @ 07:00:00 | Stop: 2024-07-30

## 2024-07-30 MED ADMIN — tafamidis (VYNDAMAX) 61 mg capsule *PATIENT SUPPLIED*: 61 mg | ORAL | @ 13:00:00

## 2024-07-30 MED ADMIN — heparin (porcine) 5,000 unit/mL injection 5,000 Units: 5000 [IU] | SUBCUTANEOUS | @ 11:00:00

## 2024-07-30 MED ADMIN — furosemide (LASIX) 160 mg in sodium chloride (NS) 0.9 % 50 mL IVPB: 160 mg | INTRAVENOUS | @ 15:00:00 | Stop: 2024-07-30

## 2024-07-30 MED ADMIN — fluticasone furoate-vilanterol (BREO ELLIPTA) 100-25 mcg/dose inhaler 1 puff: 1 | RESPIRATORY_TRACT | @ 15:00:00

## 2024-07-30 MED ADMIN — guaiFENesin (HUMIBID 3) tablet 400 mg: 400 mg | ORAL | @ 21:00:00

## 2024-07-30 MED ADMIN — guaiFENesin (HUMIBID 3) tablet 400 mg: 400 mg | ORAL | @ 15:00:00

## 2024-07-30 MED ADMIN — oxyCODONE (ROXICODONE) immediate release tablet 5 mg: 5 mg | ORAL | @ 02:00:00 | Stop: 2024-07-29

## 2024-07-30 MED ADMIN — spironolactone (ALDACTONE) split tablet 12.5 mg: 12.5 mg | ORAL | @ 13:00:00

## 2024-07-30 MED ADMIN — sodium ferric gluconate (FERRLECIT) 250 mg in sodium chloride (NS) 0.9 % 100 mL IVPB: 250 mg | INTRAVENOUS | @ 13:00:00 | Stop: 2024-08-01

## 2024-07-30 MED ADMIN — pantoprazole (Protonix) EC tablet 20 mg: 20 mg | ORAL | @ 13:00:00

## 2024-07-30 MED ADMIN — umeclidinium (INCRUSE ELLIPTA) 62.5 mcg/actuation inhaler 1 puff: 1 | RESPIRATORY_TRACT | @ 15:00:00

## 2024-07-30 MED ADMIN — gabapentin (NEURONTIN) capsule 100 mg: 100 mg | ORAL | @ 02:00:00 | Stop: 2024-07-29

## 2024-07-30 MED ADMIN — midodrine (PROAMATINE) tablet 5 mg: 5 mg | ORAL | @ 13:00:00

## 2024-07-30 MED ADMIN — furosemide (LASIX) injection 80 mg: 80 mg | INTRAVENOUS | @ 01:00:00 | Stop: 2024-07-29

## 2024-07-30 MED ADMIN — heparin (porcine) 5,000 unit/mL injection 5,000 Units: 5000 [IU] | SUBCUTANEOUS | @ 18:00:00

## 2024-07-30 MED ADMIN — amiodarone (PACERONE) tablet 200 mg: 200 mg | ORAL | @ 13:00:00

## 2024-07-30 MED ADMIN — oxyCODONE (ROXICODONE) immediate release tablet 5 mg: 5 mg | ORAL | @ 13:00:00 | Stop: 2024-08-13

## 2024-07-30 MED ADMIN — guaiFENesin (ROBITUSSIN) oral syrup: 200 mg | ORAL | @ 02:00:00 | Stop: 2024-07-29

## 2024-07-30 MED ADMIN — gabapentin (NEURONTIN) capsule 100 mg: 100 mg | ORAL | @ 13:00:00

## 2024-07-30 NOTE — Unmapped (Signed)
 Mild pain reported, managed by prn meds. Adequate UOP, pt voiding, 1 BM. Tolerating regular diet. Worked with PT/OT. Fall and safety precautions maintained. Plan of care ongoing.

## 2024-07-30 NOTE — Unmapped (Signed)
 PHYSICAL THERAPY  Evaluation (07/30/24 1528)          Patient Name:  Barbara Huber       Medical Record Number: [id number]   Date of Birth: 29-Oct-1941  Sex: Female        Post-Discharge Physical Therapy Recommendations:  PT Post Acute Discharge Recommendations: 3x weekly   Equipment Recommendation  PT DME Recommendations: None          Treatment Diagnosis: Abnormalities of gait and mobility, Generalized muscle weakness        ASSESSMENT  Problem List: Decreased cognition, Decreased strength      Assessment : Barbara Huber is a 83 y.o. 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         Pt presents to skilled PT services with functional strength and endurance deficits, requiring mod A w/ rollator for sit<>stand transfers, CGA w/ rollator for ambulation on level surface.  Pt limited by fatigue; initially planned to ambulate toward door, though requesting to sit once at foot of bed; desat to 89% on 3.0L after 8' gait trial. Pt reported feeling weak vs baseline.          Therapist reviewed findings of session and PT POC. Pt would benefit from continued skilled PT services to address safety, education and deficits. After review of contributing co-morbidities and personal factors, clinical presentation and exam findings, patient demonstrates mod complexity for evaluation and development of POC.        Recommend 3x/wk post-acute stay      Today's Interventions: Therapeutic activity, Patient/Family/Caregiver Education  Today's Interventions: PT eval, sit<>stand, bed<>BSC, monitoring of vitals. Educated pt on role of PT, findings of session, and PT POC/recs.     Personal Factors/Comorbidities Present: 3+ factors   Examination of Body System: Musculoskeletal, Cardiovascular, Pulmonary, Communication, Activity/participation  Clinical Presentation: Evolving    Eval Complexity : Moderate Complexity     Activity Tolerance: Limited by fatigue       PLAN  Planned Frequency of Treatment: Plan of Care Initiated: 07/30/24  1-2x per day Weekly Frequency: 3-4 days per week  Planned Treatment Duration: 08/13/24     Planned Interventions: Education (Patient/Family/Caregiver), Gait training, Therapeutic Exercise, Therapeutic Activity, Home exercise program     Goals:         SHORT GOAL #1: Pt will perform all bed mobility w/ supervision               Time Frame : 2 weeks  SHORT GOAL #2: Pt will perform all functional transfers w/ supervision, LRAD              Time Frame : 2 weeks  SHORT GOAL #3: Pt will ambulate 100 ft w/ supervision, LRAD              Time Frame : 2 weeks                  Time Frame : 2 weeks             Prognosis:        Barriers to Discharge: Endurance deficits, Functional strength deficits     SUBJECTIVE        Patient reports: Agreeable to PT  Pain Comments: denied pain     Services patient  receives prior to admission: PT  Prior Functional Status: Able to perform bed mobility independently. Mod I w/ rollator at home; W/C for longer distances.  Has 24/7 A from family for ADL.  Uses 3.0L supplemental oxygen   Living Situation  Living Environment: House  Lives With: Daughter, Family  Home Living: One level home, Walk-in shower, Grab bars in shower, Built-in shower seat, Shower chair with back, Ramped entrance, Bedside commode, Handicapped height toilet  Caregiver Identified?: Yes  Caregiver Availability: 24 hours  Caregiver Ability: Limited lifting  Caregiver Identified?: Yes   Equipment available at home: Rollator, Bedside commode, Rolling walker, Hand-held shower hose, Hospital Bed, Lift-recliner, Wheelchair-manual, Straight cane, Oxygen         Past Medical History[1]         Social History     Tobacco Use    Smoking status: Former     Current packs/day: 0.00     Types: Cigarettes     Quit date: 2019     Years since quitting: 6.5    Smokeless tobacco: Never    Tobacco comments:     Quit a few years ago   Substance Use Topics    Alcohol use: No       Past Surgical History[2]          Family History[3]     Allergies: Nitrofurantoin and Lipitor  [atorvastatin ]                  Objective Findings  Precautions / Restrictions  Precautions: Falls precautions  Weight Bearing Status: Non-applicable  Required Braces or Orthoses: Non-applicable        Equipment / Environment: Vascular access (PIV, TLC, Port-a-cath, PICC), Supplemental oxygen  (3 liters of O2)     Vitals/Orthostatics : Cleared by RN.   At rest (seated):  114/51 (67 MAP), 107/54 (70 MAP), 97-99% left eye on 3.0L, 92 bpm     After 8' ambulation (seated EOB): mild SOB w/ SpO2 at 89% on 3.0L, increased to 91-94% w/ rest and PLB, 77 bpm   End of Session (seated on [facility]): 68 bpm     Cognition: WFL  Visual/Perception: Wears Glasses/Contacts all the time  Hearing: No deficit identified           Upper Extremities  UE ROM: Right WFL, Left WFL  UE Strength: Right Impaired/Limited, Left Impaired/Limited  RUE Strength Impairment: Reduced strength  LUE Strength Impairment: Reduced strength  UE comment: demonstrated decreased functional strength w/ mobility    Lower Extremities  LE ROM: Right WFL, Left WFL  LE Strength: Right Impaired/Limited, Left Impaired/Limited  RLE Strength Impairment: Reduced strength  LLE Strength Impairment: Reduced strength  LE comment: BLE 5/5 knee ext, PF, 4/5 DF; demonstrated decreased functional strength w/ mobility          Sensation: Impaired (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)    Static Sitting-Level of Assistance: Independent  Dynamic Sitting-Level of Assistance: Archivist Standing-Level of Assistance: Stand by assistance  Dynamic Standing - Level of Assistance: Stand by assistance  Standing Balance comments: w/ rollator      Bed Mobility comments: TBA- received in recliner     Transfers: Sit to Stand, Bed to Chair  Sit to Stand assistance level: Moderate assist, patient does 50-74%  Bed to Chair assistance level: Moderate assist, patient does 50-74%  Transfer comments:     Pt performed sit<>stand  from recliner w/ rollator; required mod A at trunk for hip clearance, use of BUE to  ascend; increased time/effort.      Pt performed stand pivot transfer from bed>BSC, BSC positioned in front of pt. Pt required mod A at trunk for hip clearance into standing w/ use of footrest of bed and armrest of BSC to ascend; steadying assist at trunk and use of bed/BSC features for support when turning to sit.      Gait Level of Assistance: Contact guard assist, steadying assist  Gait Assistive Device: Four wheel walker  Gait Distance Ambulated (ft): 8 ft  Skilled Treatment Performed: Pt completed 8' gait trial from chair to foot of bed w/ slow reciprocal gait and increased time/effort.  Limited by fatigue; desat to 89% on 3.0L.                  Endurance: Limited; desat to 89% on 3.0L    Patient at end of session: All needs in reach, Friends/Family present, Lines intact, Notified Nurse (Pt requested to stay at [facility], RN notified)    Physical Therapy Session Duration  PT Individual [mins]: 24          AM-PAC 5 click  Help currently need turning over In bed?: A Little - Minimal/Contact Guard Assist/Supervision  Help currently needed sitting down/standing up from chair with arms? : A lot - Maximum/Moderate Assistance  Help currently needed moving from supine to sitting on edge of bed?: A Little - Minimal/Contact Guard Assist/Supervision  Help currently needed moving to and from bed from wheelchair?: A lot - Maximum/Moderate Assistance  Help currently needed walking in a hospital room?: A Little - Minimal/Contact Guard Assist/Supervision      Basic Mobility Score 5 click: 13    Score (in points): % of Functional Impairment, Limitation, Restriction  5: 100% impaired, limited, restricted  6-7: At least 80%, but less than 100% impaired, limited restricted  8-11: At least 60%, but less than 80% impaired, limited restricted  12-16: At least 40%, but less than 60% impaired, limited restricted  17-18: At least 20%, but less than 40% impaired, limited restricted  19: At least 1%, but less than 20% impaired, limited restricted  20: 0% impaired, limited restricted    'AM-PAC' forms are Copyright protected by The Trustees of [facility]         I attest that I have reviewed the above information.  Signed: Shelda Skeeters, PT  Filed 07/30/2024          [1]   Past Medical History:  Diagnosis Date    Acute exacerbation of CHF (congestive heart failure)    08/19/2023    Acute kidney injury superimposed on chronic kidney disease 10/11/2020    Acute on chronic diastolic (congestive) heart failure    08/23/2020    Acute on chronic diastolic congestive heart failure    08/23/2020    AKI (acute kidney injury) 04/14/2015    Lab Results  Component  Value  Date     CREATININE  1.90 (H)  06/12/2021     Had a bump in her creatinine when she was taking her diuretics every day.  She is currently taking 40 mg daily of torsemide  and 50 mg of spironolactone .  Her volume status is fragile.  Previously when she stopped her diuretic she becomes short of breath.  Plan: We will check her BMP today.  We will likely have to go to 40    Anemia 08/22/2019    Iron  deficiency anemia          Lab Results  Component    Value    Date           WBC    9.2    03/21/2021           RBC    3.67 (L)    03/21/2021           HGB    9.9 (L)    03/21/2021           HCT    30.7 (L)    03/21/2021           MCV    83.7    03/21/2021           MCH    26.9    03/21/2021           MCHC    32.1    03/21/2021           RDW    21.9 (H)    03/21/2021           PLT    318     Arthritis     Calculus of kidney     Calculus of ureter     CHF (congestive heart failure)        Chronic atrial fibrillation    07/20/2019    COPD (chronic obstructive pulmonary disease) Diabetes        Gangrenous cholecystitis 10/11/2020    Generalized edema  06/17/2021    GERD (gastroesophageal reflux disease)     Hydronephrosis     Hypertension     Hyponatremia 10/11/2020    Intermediate coronary syndrome    03/13/2014    Lower extremity edema 09/28/2020    Lumbar stenosis     Microscopic hematuria     Nausea alone     Nephrolithiasis 04/17/2016    Neuropathy     Nocturia     Nocturia 07/01/2017    Other chronic cystitis     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  See plans for anemia, AKI  We are also tapering her gabapentin .  Currently on 300 mg 3 times daily.  We will decrease it to twice daily, and then nightly.  She states she is not having any recurrence of her pain.    Pulmonary hypertension        Renal colic     Shortness of breath 04/28/2020    Sleep apnea     Unstable angina pectoris    03/13/2014   [2]   Past Surgical History:  Procedure Laterality Date    BACK SURGERY  1995    CARPAL TUNNEL RELEASE Left 2014    HYSTERECTOMY  1971    IR INSERT CHOLECYSTOSTOMY TUBE PERCUTANEOUS  10/02/2020    IR INSERT CHOLECYSTOSMY TUBE PERCUTANEOUS 10/02/2020 Ivonne Butler Seed, MD IMG VIR H[facility]V [facility]    LUMBAR DISC SURGERY      PR REMOVAL GALLBLADDER N/A 10/06/2020    Procedure: CHOLECYSTECTOMY;  Surgeon: Curtistine Mira Dunnings, MD;  Location: MAIN OR [facility];  Service: Trauma    PR RIGHT HEART CATH O2 SATURATION & CARDIAC OUTPUT N/A 09/30/2020    Procedure: Right Heart Catheterization;  Surgeon: Reyes Jerilynn Sage, MD;  Location: [facility] CATH;  Service: Cardiology    PR RIGHT HEART CATH O2 SATURATION & CARDIAC OUTPUT N/A 01/09/2021    Procedure: Right Heart Catheterization;  Surgeon: Norleen Deward Grumbling, MD;  Location: [facility] CATH;  Service: Cardiology   [3]   Family History  Problem Relation Age of Onset    Hypertension Mother     Cancer Father         COLON CANCER    No Known Problems Sister     No Known Problems Sister     No Known Problems Brother     No Known Problems Brother     No Known Problems Maternal Aunt     No Known Problems Maternal Uncle     No Known Problems Paternal Aunt     No Known Problems Paternal Uncle     No Known Problems Maternal Grandmother     No Known Problems Maternal Grandfather     No Known Problems Paternal Grandmother     No Known Problems Paternal Grandfather     No Known Problems Other     Anesthesia problems Neg Hx     Broken bones Neg Hx     Clotting disorder Neg Hx     Collagen disease Neg Hx     Diabetes Neg Hx     Dislocations Neg Hx     Fibromyalgia Neg Hx     Gout Neg Hx     Hemophilia Neg Hx     Osteoporosis Neg Hx     Rheumatologic disease Neg Hx     Scoliosis Neg Hx     Severe sprains Neg Hx     Sickle cell anemia Neg Hx     Spinal Compression Fracture Neg Hx     GU problems Neg Hx     Kidney cancer Neg Hx     Prostate cancer Neg Hx

## 2024-07-30 NOTE — Unmapped (Signed)
 Med M Progress Note    Assessment & Plan:   Barbara Huber is a 83 y.o. 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    Principal Problem:    Acute exacerbation of CHF (congestive heart failure)     Active Problems:    COPD (chronic obstructive pulmonary disease)       Atrial fibrillation       Chronic kidney disease ([name]), stage IV (severe)       Pulmonary hypertension       Chronic prescription opiate use    Chronic heart failure with preserved ejection fraction       Cardiac amyloidosis       Dyspepsia    Iron  deficiency anemia due to chronic blood loss    Hereditary amyloidosis       Acute on chronic hypoxic respiratory failure       Leukocytosis          Active Problems     [name] exacerbation - Acute on chronic hypoxic respiratory failure   Patient presents with generalized weakness, dyspnea and hypoxia (SpO2 in the 80s at home), increased O2 requirement from 3L to 4L, a feeling of tightness in her belly and BLE edema. She reports she has been eating a lot of watermelon. Most recent echo (06/14/24) showed LVEF 60-65%, moderately increased LV wall thickness, moderate mitral annular calcification, moderate pulmonary hypertension. Dry weight ~169 lbs, she is 171 lbs on admission. Pro-BNP 6303. Exacerbation likely triggered by volume overload given her pulmonary edema on CXR, BLE edema, and history. Left extremity appears more swollen than right, will obtain PVLs to rule out dvt given she stopped eliquis  2 weeks ago per her PCP's recommendation due to iron  deficiency anemia not responding to oral iron . Will defer CTA for now given stable vitals, no hypoxia and her underlying [name]. Less likely COPD exacerbation given her productive cough, but it has been stable and she has no wheezing on exam. She likely had an acute [name] exacerbation related to an acute viral illness several weeks ago and is responding to appropriate therapy. Continues to be fluid up on exam, 2+ Pitting edema, elevated JVP and positive HJR, will repeat lasix  dose.  - S/p IV Lasix  80 mg x1 in the ED, 160 mg overnight with good urine production (not measured)  - Repeat 160 mg lasix   - PVLs  - Continue home spironolactone  12.5 mg daily  - Hold home torsemide  80 mg daily  - Consider repeat echo   - If not improving with diuresis, consider CTA chest for PE, PVLs, or empirically putting back on Eliquis   - Continue to monitor BMP, Mg  - Maintain K >4 and Mg >2  - Strict I/Os, daily weights  - PT/OT consult  - when euvolemic     Productive cough - COPD - Pulmonary hypertension  Patient reports she has had a productive cough since being admitted in June for a COPD exacerbation, however this has remained stable and no increased sputum production. Currently I think her dyspnea is more due to [name] exacerbation rather than COPD, so I will defer steroids and antibiotics for now.   - Continue formulary equivalent of home Advair  and Spiriva  inhalers  - Continue home supplemental  O2, wean as tolerated  - Scheduled guaifenesin  400 mg q6h, PRN dextromethorphan  qHS   - Outpatient follow up with Dr. Janie      Leukocytosis  Suspect reactive ISO heart failure exacerbation. Currently no localizing signs of infection - afebrile, no urinary symptoms, productive cough is at baseline.  - Continue to monitor CBC     Iron  deficiency anemia  Hb 10.9 on admission.  She has had persistent iron  deficiency anemia despite oral iron  supplementation every other day.  2 weeks ago her PCP told her to increase the frequency of her iron  to every day, and to stop taking Eliquis .  Repeat ferritin today is still <100, so she would benefit from IV iron  given her heart failure. Her iron  deficit based on the [place] equation is approximately 1250 mg.  I do wonder if she is not absorbing oral iron  well, possibly due to gut edema secondary to her heart failure and pulmonary hypertension, as she did endorse the feeling of belly tightness today. B12 is borderline low, so will start supplementation.  - Ferrlecit  250 mg q12h x 5 doses  - Hold home oral iron  supplements  - Start B12 1000 mcg daily     Chronic Problems     Hereditary cardiac amyloidosis  Follows with cardiology NP Damien Lee.  - Continue home Vyndamax  (tafamidis ) 61 mg daily (patient supplied)  - Also getting Amvuttra  outpatient q3 months     [name]  Creatinine 2.13 on admission. Baseline Cr 1.7-2.3.  - Cystatin C pending, as an additional estimate of GFR  - Continue to monitor BMP, Mg, phos  - Avoid nephrotoxins (NSAIDs, contrasted studies, etc.)  - Renally dose medications  - Strict I/Os, daily weights     Atrial fibrillation  - Continue home amiodarone  200 mg daily  - Patient was told to stop her Eliquis  by her PCP on 07/12/24     Chronic hypotension  - Continue home midodrine  5 mg TID      Hyperlipidemia  - Hold home rosuvastatin  5 mg every other day (non-formulary and patient doesn't tolerate atorvastatin )     History of diabetes mellitus, resolved  A1c 4.8% 03/03/24.     Hyperthyroidism, resolved  S/p methimazole  therapy. TSH WNL.     Chronic back pain  - Continue home oxycodone  5 mg q8h PRN, lidocaine  patch daily, flexeril  10 mg BID PRN, gabapentin  100 mg BID     GERD  - Continue home pantoprazole  20 mg daily     History of UTI  - Continue home fosfomycin weekly to prevent UTI     Chronic constipation  - Continue Miralax  daily     Vitamin D  deficiency  - Hold home vitamin D  supplementation  - Vitamin D  level pending              Issues Impacting Complexity of Management:              Daily Checklist:  Diet: Regular Diet  DVT PPx: Heparin  5000units q8h  Code Status: Full Code  Dispo: Admit to obs    Team Contact Information: Primary Team: Med M  Primary Resident: Alyce Bend, MD,  Resident's Pager: Med M intern    Interval History:   No acute events overnight.    She states she feels better, reporting peeing a lot this morning. She had 2 BM this morning. She has no concerns/    ROS: Denies headache, chest pain, shortness of breath, abdominal pain, nausea, vomiting.    Objective:  No intake/output data recorded.    Gen: NAD, converses   HENT: atraumatic, normocephalic, Mildyelevated JVP, positive HJR  Heart: RRR  Lungs: CTAB, no wheezes or crackles  Abdomen: soft, NTND  Extremities: 2+ edema, L>R.     Alyce Bend, MD  Internal Medicine PGY-1

## 2024-07-30 NOTE — Unmapped (Signed)
 Shift Summary  Patient was admitted with acute exacerbation of CHF. Patient had no complaint of SOB or chest pain.   Applied Mepilex to coccyx and wound consult placed.   Furosemide  was administered and the patient had 2 episodes of urine and bowel movements.  Overall, the patient experienced a decrease in pain and maintained stable skin condition.     Optimal Comfort and Wellbeing: Headache pain decreased from a 7 to 0 after rest and administration of acetaminophen  in the [facility].     Rounds/Family Conference: Family member (daughter) visited during the shift, providing support and communication.     Skin Health and Integrity: Skin remained appropriate with no apparent problems in friction and shear, and moisture levels improved from occasionally moist to rarely moist.

## 2024-07-30 NOTE — Unmapped (Signed)
 OCCUPATIONAL THERAPY  Evaluation (07/30/24 1010)    Patient Name:  Barbara Huber       Medical Record Number: [id number]     Date of Birth: Oct 26, 1941  Sex: Female      Post-Discharge Occupational Therapy Recommendations: Skilled OT services NOT indicated (Family and pt only want HHOT.)          Equipment Recommendation  OT DME Recommendations: None       OT Treatment Diagnosis:  (83 y.o. female who presents to [facility] with hypoxia and generalized weakness.)         Assessment  Assessment: 83 y.o. 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 Pt and dtr were educated on the role of OT in the acute care setting. Pt was sitting on the EOB when therapist entered the room. Pt required min A for doffing and donning gown sitting at the EOB for line mgmt. Pt sat at the EOB and washed her face with su A. Pt required total A for socks sitting at the EOB. Pt has shd flexion at 90 degrees shd flexion. Pt has decreased strength in UB. Pt required min A for sit to stand from EOB using RW. Pt required min A for transferring from EOB to recliner for 10 feet using RW. Pt HR was 65 to 83 during OT session.  Pt and dtr were educated on ECT for daily activities. Pt is on 3 liters of O2 at home. Pt has an AM-PAC score of 15/24 and deficits of 56.46%. Pt is a moderate complexity pt for OT 2/2 clinical judgment and functional level. Pt to benefit from acute care OT to increase present LOF. Dispo: NO OT. Pt has 24/7 at home.    Problem List: Decreased strength, Decreased range of motion, Decreased activity tolerance, Decreased endurance, Decreased mobility, Impaired ADLs  Personal Factors/Comorbidities (Occupational Profile and History Review):  (NT)  Assessment of Occupational Performance :  (NT)  Clinical Decision Making:  (NT)    Today's Interventions: ADL retraining, Balance activities, Bed mobility, Conservation, Education - Patient, Education - Family / caregiver, Endurance activities, Functional cognition, Functional mobility, Positioning, Range of motion, Transfer training, UE Strength / coordination exercise, Other  Today's Interventions: self care, balance, bed mobility, ECT, pt edu., family edu., staff edu., endurance, functional cognition, functional mobility, positioning, ROM, transfers, strength, AM-PAC 15/24    Activity Tolerance During Today's Session  Limited by fatigue    Plan  Planned Frequency of Treatment: Plan of Care Initiated: 07/30/24  1-2x per day Weekly Frequency: 2-3 days per week  Planned Treatment Duration: 08/06/24    Planned Interventions:  Education (Patient/Family/Caregiver), Self-Care/Home Training, Therapeutic Activity, Therapeutic Exercise      GOALS:   Patient and Family Goals: to get better    Short Term:   SHORT GOAL #1: Pt to complete BSC transfer with CGA using RW.   Time Frame : 1 week  SHORT GOAL #2: Pt to be seated at sink and complete one to two grooming activities with su A only.   Time Frame : 1 week  SHORT GOAL #3: Pt to doff and donn gown sitting at the EOB with SBA -su A onlyl   Time Frame : 1 week  Long Term Goal #1: Pt to reach maximum LOF in 6 weeks.       Prognosis:  Fair  Positive Indicators:  Family and caregiver support  Barriers to Discharge: None    Subjective  Medical Updates Since Last Visit/Relevant PMH Affecting Clinical Decision Making: NA  Prior Functional Status Pt and dtr reports she requires max A for bathing and dressing and min A for grooming. Pt uses a rollator with S-SBA from family. Pt has 24/7S/A.    Living Situation  Living Environment: House  Lives With: Daughter, Family  Home Living: One level home, Walk-in shower, Grab bars in shower, Built-in shower seat, Shower chair with back, Ramped entrance, Bedside commode, Handicapped height toilet  Caregiver Identified?: Yes  Caregiver Availability: 24 hours  Caregiver Ability: Nurse, adult available at home: Rollator, Bedside commode, Rolling walker, Hand-held shower hose, Hospital Bed, Lift-recliner, Wheelchair-manual, Straight cane, Oxygen     Medical Tests / Procedures: NA  Services patient receives prior to admission: PT    Patient / Caregiver reports: Charity fundraiser and pt agreed to OT.      Past Medical History[1] Social History     Tobacco Use    Smoking status: Former     Current packs/day: 0.00     Types: Cigarettes     Quit date: 2019     Years since quitting: 6.5    Smokeless tobacco: Never    Tobacco comments:     Quit a few years ago   Substance Use Topics    Alcohol use: No      Past Surgical History[2] Family History[3]     Nitrofurantoin and Lipitor  [atorvastatin ]     Objective Findings  Precautions / Restrictions  Falls precautions       Weight Bearing  Non-applicable    Required Braces or Orthoses  Non-applicable    Communication Preference  Verbal, Visual       Pain  Pt reports she has no pain.    Equipment / Environment  Vascular access (PIV, TLC, Port-a-cath, PICC), Supplemental oxygen  (3 liters of O2)         Cognition   Orientation Level:  Disoriented to time   Arousal/Alertness:  Appropriate responses to stimuli   Attention Span:  Appears intact   Memory:  Appears intact   Following Commands:  Follows all commands without difficulty   Safety Judgment:  Good awareness of safety precautions   Awareness of Errors and Problem Solving:  Patient self-corrected errors   Comments:      Vision / Hearing   Vision: Wears glasses all the time, Glasses present     Hearing: No deficit identified         Hand Function:  Right Hand Function: Right hand function impaired  Right Hand Impairment: grip strength fair  Left Hand Function: Left hand function impaired  Left Hand Impairment: grip strength fair  Hand Dominance: Right    Skin Inspection:  Skin Inspection: Intact where visualized    Face/Cervical ROM:  Face ROM: WFL    ROM / Strength:  UE ROM/Strength: Left Impaired/Limited, Right Impaired/Limited  RUE Impairment: Reduced strength, Limited AROM  LUE Impairment: Reduced strength, Limited AROM  LE ROM/Strength: Left Impaired/Limited, Right Impaired/Limited  RLE Impairment: Reduced strength  LLE Impairment: Reduced strength    Coordination:  Coordination: WFL    Sensation:  RUE Sensation: RUE impaired  RUE Sensation Impairment: chronic peripheral neuropathy  LUE Sensation: LUE impaired  LUE Sensation Impairment: chronic peripheral  neuropathy  RLE Sensation: RLE impaired  RLE Sensation Impairment: chronic peripheral neuropathy  LLE Sensation: LLE impaired  LLE Sensation Impairment: chronic peripheral neuropathy    Balance:  Static Sitting-Level of Assistance: Independent  Dynamic Sitting-Level of Assistance: Independent    Static Standing-Level of Assistance: Contact guard  Dynamic Standing - Level of Assistance: Contact guard, Minimum assistance  Standing Balance comments: using RW.    Functional Mobility  Transfers: Contact Guard assist, Min assist, Physical assistance required, Verbal assist/cues required  Bed Mobility - Needs Assistance: Min assist  Ambulation: Pt ambulated 10 feet with CGA-min A using RW.    ADLs  Feeding - Needs Assistance: Set Up Assist  Grooming - Needs Assistance: Set Up Assist, Performed seated  UB Dressing - Needs Assistance: Min assist, Performed seated (line mgmt)  LB Dressing - Needs Assistance: Max assist, Total Assist, Performed seated (socks)  IADLs: NT    Vitals / Orthostatics  Vitals/Orthostatics: HR 65 to 83 thru out OT session    Patient at end of session: All needs in reach, Friends/Family present, In chair, Notified Nurse     Occupational Therapy Session Duration  OT Individual [mins]: 40       AM-PAC-Daily Activity  Lower Body Dressing assistance needs: A lot - Maximum/Moderate Assistance  Bathing assistance needs: A lot - Maximum/Moderate Assistance  Toileting assistance needs: A lot - Maximum/Moderate Assistance  Upper Body Dressing assistance needs: A Little - Minimal/Contact Guard Assist/Supervision  Personal Grooming assistance needs: A lot - Maximum/Moderate Assistance  Eating Meals assistance needs: None - Modified Independent/Independent    Daily Activity Score: 15    Score (in points): % of Functional Impairment, Limitation, Restriction  6: 100% impaired, limited, restricted  7-8: At least 80%, but less than 100% impaired, limited restricted  9-13: At least 60%, but less than 80% impaired, limited restricted  14-19: At least 40%, but less than 60% impaired, limited restricted  20-22: At least 20%, but less than 40% impaired, limited restricted  23: At least 1%, but less than 20% impaired, limited restricted  24: 0% impaired, limited restricted      I attest that I have reviewed the above information.  Signed: Suzen JONETTA Custard, OT  Filed 07/30/2024                 [1]   Past Medical History:  Diagnosis Date    Acute exacerbation of CHF (congestive heart failure)    08/19/2023    Acute kidney injury superimposed on chronic kidney disease 10/11/2020    Acute on chronic diastolic (congestive) heart failure    08/23/2020    Acute on chronic diastolic congestive heart failure    08/23/2020    AKI (acute kidney injury) 04/14/2015    Lab Results  Component  Value  Date     CREATININE  1.90 (H)  06/12/2021     Had a bump in her creatinine when she was taking her diuretics every day.  She is currently taking 40 mg daily of torsemide  and 50 mg of spironolactone .  Her volume status is fragile.  Previously when she stopped her diuretic she becomes short of breath.  Plan: We will check her BMP today.  We will likely have to go to 40    Anemia 08/22/2019    Iron  deficiency anemia          Lab Results      Component    Value    Date  WBC    9.2    03/21/2021           RBC    3.67 (L) 03/21/2021           HGB    9.9 (L)    03/21/2021           HCT    30.7 (L)    03/21/2021           MCV    83.7    03/21/2021           MCH    26.9    03/21/2021           MCHC    32.1    03/21/2021           RDW    21.9 (H)    03/21/2021           PLT    318     Arthritis     Calculus of kidney     Calculus of ureter     CHF (congestive heart failure)        Chronic atrial fibrillation    07/20/2019    COPD (chronic obstructive pulmonary disease)        Diabetes        Gangrenous cholecystitis 10/11/2020    Generalized edema  06/17/2021    GERD (gastroesophageal reflux disease)     Hydronephrosis     Hypertension     Hyponatremia 10/11/2020    Intermediate coronary syndrome    03/13/2014    Lower extremity edema 09/28/2020    Lumbar stenosis     Microscopic hematuria     Nausea alone     Nephrolithiasis 04/17/2016    Neuropathy     Nocturia     Nocturia 07/01/2017    Other chronic cystitis     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  See plans for anemia, AKI  We are also tapering her gabapentin .  Currently on 300 mg 3 times daily.  We will decrease it to twice daily, and then nightly.  She states she is not having any recurrence of her pain.    Pulmonary hypertension        Renal colic     Shortness of breath 04/28/2020    Sleep apnea     Unstable angina pectoris    03/13/2014   [2]   Past Surgical History:  Procedure Laterality Date    BACK SURGERY  1995    CARPAL TUNNEL RELEASE Left 2014    HYSTERECTOMY  1971    IR INSERT CHOLECYSTOSTOMY TUBE PERCUTANEOUS  10/02/2020    IR INSERT CHOLECYSTOSMY TUBE PERCUTANEOUS 10/02/2020 Ivonne Butler Seed, MD IMG VIR [facility]    LUMBAR DISC SURGERY      PR REMOVAL GALLBLADDER N/A 10/06/2020    Procedure: CHOLECYSTECTOMY;  Surgeon: Curtistine Mira Dunnings, MD;  Location: MAIN OR [facility];  Service: Trauma    PR RIGHT HEART CATH O2 SATURATION & CARDIAC OUTPUT N/A 09/30/2020    Procedure: Right Heart Catheterization;  Surgeon: Reyes Jerilynn Sage, MD;  Location: [facility] CATH;  Service: Cardiology    PR RIGHT HEART CATH O2 SATURATION & CARDIAC OUTPUT N/A 01/09/2021    Procedure: Right Heart Catheterization;  Surgeon: Norleen Deward Grumbling, MD;  Location: [facility] CATH;  Service: Cardiology   [3]   Family History  Problem Relation Age of Onset    Hypertension Mother     Cancer  Father         COLON CANCER    No Known Problems Sister     No Known Problems Sister     No Known Problems Brother     No Known Problems Brother     No Known Problems Maternal Aunt     No Known Problems Maternal Uncle     No Known Problems Paternal Aunt     No Known Problems Paternal Uncle     No Known Problems Maternal Grandmother     No Known Problems Maternal Grandfather     No Known Problems Paternal Grandmother     No Known Problems Paternal Grandfather     No Known Problems Other     Anesthesia problems Neg Hx     Broken bones Neg Hx     Clotting disorder Neg Hx     Collagen disease Neg Hx     Diabetes Neg Hx     Dislocations Neg Hx     Fibromyalgia Neg Hx     Gout Neg Hx     Hemophilia Neg Hx     Osteoporosis Neg Hx     Rheumatologic disease Neg Hx     Scoliosis Neg Hx     Severe sprains Neg Hx     Sickle cell anemia Neg Hx     Spinal Compression Fracture Neg Hx     GU problems Neg Hx     Kidney cancer Neg Hx     Prostate cancer Neg Hx

## 2024-07-31 DIAGNOSIS — J441 Chronic obstructive pulmonary disease with (acute) exacerbation: Principal | ICD-10-CM

## 2024-07-31 DIAGNOSIS — I110 Hypertensive heart disease with heart failure: Principal | ICD-10-CM

## 2024-07-31 DIAGNOSIS — J9612 Chronic respiratory failure with hypercapnia: Principal | ICD-10-CM

## 2024-07-31 DIAGNOSIS — J189 Pneumonia, unspecified organism: Principal | ICD-10-CM

## 2024-07-31 DIAGNOSIS — Z7901 Long term (current) use of anticoagulants: Principal | ICD-10-CM

## 2024-07-31 DIAGNOSIS — E079 Disorder of thyroid, unspecified: Principal | ICD-10-CM

## 2024-07-31 DIAGNOSIS — D631 Anemia in chronic kidney disease: Principal | ICD-10-CM

## 2024-07-31 DIAGNOSIS — Z7951 Long term (current) use of inhaled steroids: Principal | ICD-10-CM

## 2024-07-31 DIAGNOSIS — G4733 Obstructive sleep apnea (adult) (pediatric): Principal | ICD-10-CM

## 2024-07-31 DIAGNOSIS — R339 Retention of urine, unspecified: Principal | ICD-10-CM

## 2024-07-31 DIAGNOSIS — I5032 Chronic diastolic (congestive) heart failure: Principal | ICD-10-CM

## 2024-07-31 DIAGNOSIS — Z556 Problems related to health literacy: Principal | ICD-10-CM

## 2024-07-31 DIAGNOSIS — E854 Organ-limited amyloidosis: Principal | ICD-10-CM

## 2024-07-31 DIAGNOSIS — J9611 Chronic respiratory failure with hypoxia: Principal | ICD-10-CM

## 2024-07-31 DIAGNOSIS — N184 Chronic kidney disease, stage 4 (severe): Principal | ICD-10-CM

## 2024-07-31 DIAGNOSIS — G629 Polyneuropathy, unspecified: Principal | ICD-10-CM

## 2024-07-31 DIAGNOSIS — E1122 Type 2 diabetes mellitus with diabetic chronic kidney disease: Principal | ICD-10-CM

## 2024-07-31 DIAGNOSIS — G8929 Other chronic pain: Principal | ICD-10-CM

## 2024-07-31 DIAGNOSIS — J440 Chronic obstructive pulmonary disease with acute lower respiratory infection: Principal | ICD-10-CM

## 2024-07-31 DIAGNOSIS — I272 Pulmonary hypertension, unspecified: Principal | ICD-10-CM

## 2024-07-31 DIAGNOSIS — I4819 Other persistent atrial fibrillation: Principal | ICD-10-CM

## 2024-07-31 DIAGNOSIS — L989 Disorder of the skin and subcutaneous tissue, unspecified: Principal | ICD-10-CM

## 2024-07-31 DIAGNOSIS — Z9981 Dependence on supplemental oxygen: Principal | ICD-10-CM

## 2024-07-31 LAB — CBC
HEMATOCRIT: 31 % — ABNORMAL LOW (ref 34.0–44.0)
HEMOGLOBIN: 10.4 g/dL — ABNORMAL LOW (ref 11.3–14.9)
MEAN CORPUSCULAR HEMOGLOBIN CONC: 33.4 g/dL (ref 32.0–36.0)
MEAN CORPUSCULAR HEMOGLOBIN: 30.6 pg (ref 25.9–32.4)
MEAN CORPUSCULAR VOLUME: 91.6 fL (ref 77.6–95.7)
MEAN PLATELET VOLUME: 8.6 fL (ref 6.8–10.7)
PLATELET COUNT: 181 10*9/L (ref 150–450)
RED BLOOD CELL COUNT: 3.39 10*12/L — ABNORMAL LOW (ref 3.95–5.13)
RED CELL DISTRIBUTION WIDTH: 13.9 % (ref 12.2–15.2)
WBC ADJUSTED: 9.3 10*9/L (ref 3.6–11.2)

## 2024-07-31 LAB — BASIC METABOLIC PANEL
ANION GAP: 14 mmol/L (ref 5–14)
BLOOD UREA NITROGEN: 40 mg/dL — ABNORMAL HIGH (ref 9–23)
BUN / CREAT RATIO: 19
CALCIUM: 8.9 mg/dL (ref 8.7–10.4)
CHLORIDE: 100 mmol/L (ref 98–107)
CO2: 27.7 mmol/L (ref 20.0–31.0)
CREATININE: 2.06 mg/dL — ABNORMAL HIGH (ref 0.55–1.02)
EGFR CKD-EPI (2021) FEMALE: 24 mL/min/1.73m2 — ABNORMAL LOW (ref >=60)
GLUCOSE RANDOM: 94 mg/dL (ref 70–179)
POTASSIUM: 4 mmol/L (ref 3.4–4.8)
SODIUM: 142 mmol/L (ref 135–145)

## 2024-07-31 LAB — PHOSPHORUS: PHOSPHORUS: 3.8 mg/dL (ref 2.4–5.1)

## 2024-07-31 LAB — MAGNESIUM: MAGNESIUM: 2.3 mg/dL (ref 1.6–2.6)

## 2024-07-31 MED ORDER — TORSEMIDE 20 MG TABLET
ORAL_TABLET | Freq: Every day | ORAL | 0 refills | 45.00000 days | Status: CP
Start: 2024-07-31 — End: ?

## 2024-07-31 MED ADMIN — midodrine (PROAMATINE) tablet 5 mg: 5 mg | ORAL | @ 12:00:00 | Stop: 2024-07-31

## 2024-07-31 MED ADMIN — gabapentin (NEURONTIN) capsule 100 mg: 100 mg | ORAL | @ 01:00:00

## 2024-07-31 MED ADMIN — spironolactone (ALDACTONE) split tablet 12.5 mg: 12.5 mg | ORAL | @ 12:00:00 | Stop: 2024-07-31

## 2024-07-31 MED ADMIN — polyethylene glycol (MIRALAX) packet 17 g: 17 g | ORAL | @ 13:00:00 | Stop: 2024-07-31

## 2024-07-31 MED ADMIN — midodrine (PROAMATINE) tablet 5 mg: 5 mg | ORAL | @ 01:00:00

## 2024-07-31 MED ADMIN — heparin (porcine) 5,000 unit/mL injection 5,000 Units: 5000 [IU] | SUBCUTANEOUS | @ 01:00:00

## 2024-07-31 MED ADMIN — torsemide (DEMADEX) tablet 80 mg: 80 mg | ORAL | @ 14:00:00 | Stop: 2024-07-31

## 2024-07-31 MED ADMIN — dextromethorphan (DELSYM) ER oral suspension: 60 mg | ORAL | @ 02:00:00

## 2024-07-31 MED ADMIN — tafamidis (VYNDAMAX) 61 mg capsule *PATIENT SUPPLIED*: 61 mg | ORAL | @ 12:00:00 | Stop: 2024-07-31

## 2024-07-31 MED ADMIN — sodium ferric gluconate (FERRLECIT) 250 mg in sodium chloride (NS) 0.9 % 100 mL IVPB: 250 mg | INTRAVENOUS | @ 01:00:00 | Stop: 2024-08-01

## 2024-07-31 MED ADMIN — sodium ferric gluconate (FERRLECIT) 250 mg in sodium chloride (NS) 0.9 % 100 mL IVPB: 250 mg | INTRAVENOUS | @ 12:00:00 | Stop: 2024-07-31

## 2024-07-31 MED ADMIN — umeclidinium (INCRUSE ELLIPTA) 62.5 mcg/actuation inhaler 1 puff: 1 | RESPIRATORY_TRACT | @ 12:00:00 | Stop: 2024-07-31

## 2024-07-31 MED ADMIN — guaiFENesin (HUMIBID 3) tablet 400 mg: 400 mg | ORAL | @ 17:00:00 | Stop: 2024-07-31

## 2024-07-31 MED ADMIN — midodrine (PROAMATINE) tablet 5 mg: 5 mg | ORAL | @ 17:00:00 | Stop: 2024-07-31

## 2024-07-31 MED ADMIN — melatonin tablet 3 mg: 3 mg | ORAL | @ 04:00:00 | Stop: 2024-07-31

## 2024-07-31 MED ADMIN — oxyCODONE (ROXICODONE) immediate release tablet 5 mg: 5 mg | ORAL | @ 01:00:00 | Stop: 2024-08-13

## 2024-07-31 MED ADMIN — cyanocobalamin (vitamin B-12) tablet 1,000 mcg: 1000 ug | ORAL | @ 12:00:00 | Stop: 2024-07-31

## 2024-07-31 MED ADMIN — guaiFENesin (HUMIBID 3) tablet 400 mg: 400 mg | ORAL | @ 04:00:00 | Stop: 2024-07-31

## 2024-07-31 MED ADMIN — pantoprazole (Protonix) EC tablet 20 mg: 20 mg | ORAL | @ 12:00:00 | Stop: 2024-07-31

## 2024-07-31 MED ADMIN — cyclobenzaprine (FLEXERIL) tablet 10 mg: 10 mg | ORAL | @ 01:00:00

## 2024-07-31 MED ADMIN — acetaminophen (TYLENOL) tablet 650 mg: 650 mg | ORAL | @ 07:00:00 | Stop: 2024-07-31

## 2024-07-31 MED ADMIN — fluticasone furoate-vilanterol (BREO ELLIPTA) 100-25 mcg/dose inhaler 1 puff: 1 | RESPIRATORY_TRACT | @ 12:00:00 | Stop: 2024-07-31

## 2024-07-31 MED ADMIN — guaiFENesin (HUMIBID 3) tablet 400 mg: 400 mg | ORAL | @ 09:00:00 | Stop: 2024-07-31

## 2024-07-31 MED ADMIN — oxyCODONE (ROXICODONE) immediate release tablet 5 mg: 5 mg | ORAL | @ 13:00:00 | Stop: 2024-07-31

## 2024-07-31 MED ADMIN — amiodarone (PACERONE) tablet 200 mg: 200 mg | ORAL | @ 12:00:00 | Stop: 2024-07-31

## 2024-07-31 MED ADMIN — gabapentin (NEURONTIN) capsule 100 mg: 100 mg | ORAL | @ 12:00:00 | Stop: 2024-07-31

## 2024-07-31 MED ADMIN — heparin (porcine) 5,000 unit/mL injection 5,000 Units: 5000 [IU] | SUBCUTANEOUS | @ 09:00:00 | Stop: 2024-07-31

## 2024-07-31 NOTE — Unmapped (Signed)
 Problem: Adult Inpatient Plan of Care  Goal: Plan of Care Review  07/31/2024 1318 by Trudy Nanetta FALCON, RN  Outcome: Discharged to Home  07/31/2024 1031 by Trudy Nanetta FALCON, RN  Outcome: Shift Focus  Goal: Patient-Specific Goal (Individualized)  Outcome: Discharged to Home  Goal: Absence of Hospital-Acquired Illness or Injury  Outcome: Discharged to Home  Intervention: Identify and Manage Fall Risk  Recent Flowsheet Documentation  Taken 07/31/2024 1030 by Trudy Nanetta FALCON, RN  Safety Interventions:   fall reduction program maintained   low bed   nonskid shoes/slippers when out of bed  Taken 07/31/2024 0826 by Trudy Nanetta FALCON, RN  Safety Interventions:   fall reduction program maintained   low bed   nonskid shoes/slippers when out of bed  Intervention: Prevent Skin Injury  Recent Flowsheet Documentation  Taken 07/31/2024 1030 by Trudy Nanetta FALCON, RN  Positioning for Skin: Sitting in Chair  Taken 07/31/2024 0826 by Trudy Nanetta FALCON, RN  Positioning for Skin: Supine/Back  Goal: Optimal Comfort and Wellbeing  Outcome: Discharged to Home  Goal: Readiness for Transition of Care  Outcome: Discharged to Home  Goal: Rounds/Family Conference  Outcome: Discharged to Home     Problem: Fall Injury Risk  Goal: Absence of Fall and Fall-Related Injury  07/31/2024 1318 by Trudy Nanetta FALCON, RN  Outcome: Discharged to Home  07/31/2024 1031 by Trudy Nanetta FALCON, RN  Outcome: Shift Focus  Intervention: Promote Injury-Free Environment  Recent Flowsheet Documentation  Taken 07/31/2024 1030 by Trudy Nanetta FALCON, RN  Safety Interventions:   fall reduction program maintained   low bed   nonskid shoes/slippers when out of bed  Taken 07/31/2024 0826 by Trudy Nanetta FALCON, RN  Safety Interventions:   fall reduction program maintained   low bed   nonskid shoes/slippers when out of bed     Problem: Skin Injury Risk Increased  Goal: Skin Health and Integrity  07/31/2024 1318 by Trudy Nanetta FALCON, RN  Outcome: Discharged to Home  07/31/2024 1031 by Trudy Nanetta FALCON, RN  Outcome: Shift Focus  Intervention: Optimize Skin Protection  Recent Flowsheet Documentation  Taken 07/31/2024 9173 by Trudy Nanetta FALCON, RN  Pressure Reduction Techniques: frequent weight shift encouraged     Problem: Self-Care Deficit  Goal: Improved Ability to Complete Activities of Daily Living  07/31/2024 1318 by Trudy Nanetta FALCON, RN  Outcome: Discharged to Home  07/31/2024 1031 by Trudy Nanetta FALCON, RN  Outcome: Shift Focus     Discharge orders present and patient verbalizes readiness for discharge.   AVS reviewed and patient verbalized understanding with no additional questions. The pt's PIV  was removed without complications.  Pt escorted out of hospital via wheelchair with belongings.

## 2024-07-31 NOTE — Unmapped (Addendum)
 Home health has been ordered for you and is set up to be delivered to you at home after your discharge.  The agency providing your services is listed below.    [facility]  - Admitted Since 07/29/2024       Service Provider Services Address Phone Fax Patient Preferred    [facility] [address] Barbara Huber [place] [address] [phone] --

## 2024-07-31 NOTE — Unmapped (Signed)
 Shift Summary  Pain management was effective with a significant reduction in pain levels after medication administration.    Sodium ferric gluconate (FERRLECIT ) was administered and later stopped during the shift.    Fall risk interventions were consistently applied, preventing any fall-related injuries.    Assistance was required for daily activities, but minimal assistance was needed for hygiene tasks.    Overall, the patient's comfort improved, and no falls occurred during the shift.   Pt up with stand by assist to bedside commode voiding, had bm this shift.  Gave po pain meds x2 this shift.  Safety maintained, no acute events this shift.    Optimal Comfort and Wellbeing: Pain levels decreased significantly from 8 to 2 after administration of oxyCODONE  and cyclobenzaprine , with a slight increase to 3 later in the shift after acetaminophen  was given.     Absence of Fall and Fall-Related Injury: Fall risk interventions were consistently applied, including hourly visual checks and use of safety devices during transfers, with no falls reported.     Improved Ability to Complete Activities of Daily Living: Minimal assistance was required for general hygiene and bathing, indicating some improvement in the ability to perform daily activities.     Problem: Adult Inpatient Plan of Care  Goal: Plan of Care Review  Outcome: Shift Focus  Goal: Optimal Comfort and Wellbeing  Outcome: Shift Focus     Problem: Fall Injury Risk  Goal: Absence of Fall and Fall-Related Injury  Outcome: Shift Focus  Intervention: Promote Injury-Free Environment  Recent Flowsheet Documentation  Taken 07/31/2024 0401 by Elinor Maus, RN  Safety Interventions:   aspiration precautions   fall reduction program maintained   family at bedside   infection management   low bed  Taken 07/31/2024 0201 by Elinor Maus, RN  Safety Interventions:   aspiration precautions   fall reduction program maintained   family at bedside   infection management low bed  Taken 07/31/2024 0001 by Elinor Maus, RN  Safety Interventions:   aspiration precautions   fall reduction program maintained   family at bedside   infection management   low bed  Taken 07/30/2024 2201 by Elinor Maus, RN  Safety Interventions:   aspiration precautions   fall reduction program maintained   family at bedside   infection management   low bed  Taken 07/30/2024 2001 by Elinor Maus, RN  Safety Interventions:   aspiration precautions   fall reduction program maintained   family at bedside   infection management   low bed     Problem: Self-Care Deficit  Goal: Improved Ability to Complete Activities of Daily Living  Outcome: Shift Focus     Problem: Adult Inpatient Plan of Care  Goal: Absence of Hospital-Acquired Illness or Injury  Intervention: Identify and Manage Fall Risk  Recent Flowsheet Documentation  Taken 07/31/2024 0401 by Elinor Maus, RN  Safety Interventions:   aspiration precautions   fall reduction program maintained   family at bedside   infection management   low bed  Taken 07/31/2024 0201 by Elinor Maus, RN  Safety Interventions:   aspiration precautions   fall reduction program maintained   family at bedside   infection management   low bed  Taken 07/31/2024 0001 by Andrea Ferrer, RN  Safety Interventions:   aspiration precautions   fall reduction program maintained   family at bedside   infection management   low bed  Taken 07/30/2024 2201 by Tynesia Harral, RN  Safety Interventions:   aspiration precautions  fall reduction program maintained   family at bedside   infection management   low bed  Taken 07/30/2024 2001 by Kenli Waldo, RN  Safety Interventions:   aspiration precautions   fall reduction program maintained   family at bedside   infection management   low bed  Intervention: Prevent Skin Injury  Recent Flowsheet Documentation  Taken 07/31/2024 0401 by Elinor Maus, RN  Positioning for Skin: Supine/Back  Skin Protection: incontinence pads utilized  Taken 07/31/2024 0201 by Elinor Maus, RN  Positioning for Skin: Supine/Back  Skin Protection: incontinence pads utilized  Taken 07/31/2024 0001 by Elinor Maus, RN  Positioning for Skin: Supine/Back  Skin Protection: incontinence pads utilized  Taken 07/30/2024 2201 by Elinor Maus, RN  Positioning for Skin: Supine/Back  Skin Protection: incontinence pads utilized  Taken 07/30/2024 2001 by Elinor Maus, RN  Positioning for Skin: Supine/Back  Skin Protection: incontinence pads utilized  Intervention: Prevent and Manage VTE (Venous Thromboembolism) Risk  Recent Flowsheet Documentation  Taken 07/31/2024 0401 by Elinor Maus, RN  Anti-Embolism Device Status: (sq heparin . ambualting) Other (Comment)  Taken 07/31/2024 0201 by Elinor Maus, RN  Anti-Embolism Device Status: (sq heparin . ambualting) Other (Comment)  Taken 07/31/2024 0001 by Elinor Maus, RN  Anti-Embolism Device Status: (sq heparin . ambualting) Other (Comment)  Taken 07/30/2024 2201 by Elinor Maus, RN  Anti-Embolism Device Status: (sq heparin . ambualting) Other (Comment)  Taken 07/30/2024 2001 by Elinor Maus, RN  Anti-Embolism Device Status: (sq heparin . ambualting) Other (Comment)  Taken 07/30/2024 1955 by Elinor Maus, RN  Anti-Embolism Device Status: (heparin  sq) Other (Comment)  Intervention: Prevent Infection  Recent Flowsheet Documentation  Taken 07/31/2024 0401 by Elinor Maus, RN  Infection Prevention:   cohorting utilized   hand hygiene promoted  Taken 07/31/2024 0201 by Elinor Maus, RN  Infection Prevention:   cohorting utilized   hand hygiene promoted  Taken 07/31/2024 0001 by Elinor Maus, RN  Infection Prevention:   cohorting utilized   hand hygiene promoted  Taken 07/30/2024 2201 by Elinor Maus, RN  Infection Prevention:   cohorting utilized   hand hygiene promoted  Taken 07/30/2024 2001 by Elinor Maus, RN  Infection Prevention: cohorting utilized   hand hygiene promoted     Problem: Skin Injury Risk Increased  Goal: Skin Health and Integrity  Intervention: Optimize Skin Protection  Recent Flowsheet Documentation  Taken 07/31/2024 0401 by Elinor Maus, RN  Pressure Reduction Techniques: frequent weight shift encouraged  Head of Bed (HOB) Positioning: HOB at 30-45 degrees  Pressure Reduction Devices: positioning supports utilized  Skin Protection: incontinence pads utilized  Taken 07/31/2024 0201 by Elinor Maus, RN  Pressure Reduction Techniques: frequent weight shift encouraged  Head of Bed (HOB) Positioning: HOB at 30-45 degrees  Pressure Reduction Devices: positioning supports utilized  Skin Protection: incontinence pads utilized  Taken 07/31/2024 0001 by Elinor Maus, RN  Pressure Reduction Techniques: frequent weight shift encouraged  Head of Bed (HOB) Positioning: HOB at 30-45 degrees  Pressure Reduction Devices: positioning supports utilized  Skin Protection: incontinence pads utilized  Taken 07/30/2024 2201 by Elinor Maus, RN  Pressure Reduction Techniques: frequent weight shift encouraged  Head of Bed (HOB) Positioning: HOB at 30-45 degrees  Pressure Reduction Devices: positioning supports utilized  Skin Protection: incontinence pads utilized  Taken 07/30/2024 2001 by Elinor Maus, RN  Pressure Reduction Techniques: frequent weight shift encouraged  Head of Bed (HOB) Positioning: HOB at 30-45 degrees  Pressure Reduction Devices: positioning supports utilized  Skin Protection: incontinence pads utilized

## 2024-07-31 NOTE — Unmapped (Signed)
 Physician Discharge Summary HBR  3 BT1 HBR  [address]  [place] [address]  Dept: [phone]  Loc: [phone]     Identifying Information:   Barbara Huber  06-05-1941  [id number]    Primary Care Physician: [name], MD     Code Status: Full Code    Admit Date: 07/29/2024    Discharge Date: 07/31/2024     Discharge To: Home    Discharge Service: HBR - General Medicine Floor Team (MED CHRISTELLA GLENWOOD [facility])     Discharge Attending Physician: Ozell JINNY Hirschfeld, MD PhD    Discharge Diagnoses:   Principal Problem:    Acute exacerbation of CHF (congestive heart failure)    (POA: Yes)  Active Problems:    COPD (chronic obstructive pulmonary disease)    (POA: Yes)    Atrial fibrillation    (POA: Yes)    Chronic kidney disease ([name]), stage IV (severe)    (POA: Yes)    Pulmonary hypertension    (POA: Yes)    Chronic prescription opiate use (POA: Not Applicable)    Chronic heart failure with preserved ejection fraction    (POA: Yes)    Cardiac amyloidosis    (POA: Yes)    Dyspepsia (POA: Yes)    Iron  deficiency anemia due to chronic blood loss (POA: Yes)    Hereditary amyloidosis    (POA: Yes)    Acute on chronic hypoxic respiratory failure    (POA: Yes)    Leukocytosis (POA: Yes)  Resolved Problems:    * No resolved hospital problems. *    Outpatient Provider Follow Up Issues:    [ ]  Discharged on home dose of toremide 80mg . Was given 160 IV lasix  x2, please ensure patient is euvolemic   [ ]  Gave rx for spironolactone       Hospital Course:   Barbara Huber is a 83 y.o. female whose presentation is complicated by pulmonary hypertension, COPD with chronic hypoxemic respiratory failure on 3L O2 [place], stage IV/V [name], [name] secondary to cardiac amyloidosis, atrial fibrillation on amiodarone , chronic back pain, GERD who presents to [facility] with hypoxia and generalized weakness likely [name] exacerbation iso acute illness. She was 3-5 lbs above her dry weight with admission, gave two doses of IV lasix  with robust urine output. On day of discharge she was her dry weight of 169 lbs, and discharged on her home torsemide  dose. She has follow up with cardiology this week and PCP next week.    Active Problems     [name] exacerbation - Acute on chronic hypoxic respiratory failure   Patient presents with generalized weakness, dyspnea and hypoxia (SpO2 in the 80s at home), increased O2 requirement from 3L to 4L, a feeling of tightness in her belly and BLE edema.  Most recent echo (06/14/24) showed LVEF 60-65%, moderately increased LV wall thickness, moderate mitral annular calcification, moderate pulmonary hypertension. Dry weight ~169 lbs, she is 171 lbs on admission. Pro-BNP 6303. Pulmonary edema on CXR. Considered PVLs to rule out DVT for asymmetric swelling  de. Will defer CTA for now given stable vitals, no hypoxia and her underlying [name]. Achieved her dry weight on day 2 of hospital admission and was euvolemic and discharged on home torsemide .       Productive cough - COPD - Pulmonary hypertension  Patient reports she has had a productive cough since being admitted in June for a COPD exacerbation, however this has remained stable and no increased sputum production. She continues  to have a cough but we suspect it is related to her recent illness. She is back on her home oxygen  of 3L and was discharged on home medications.       Leukocytosis  Suspect reactive ISO heart failure exacerbation. No localizing signs of infection        Iron  deficiency anemia  Hb 10.9 on admission.  She has had persistent iron  deficiency anemia despite oral iron  supplementation every other day. Was started on IV iron  in hospital for total of 1250 mg.            Procedures:    ______________________________________________________________________  Discharge Medications:      Your Medication List        STOP taking these medications      HYDROcodone -chlorpheniramine  polistirex 10-8 mg/5 mL ER suspension  Commonly known as: TUSSIONEX PENNKINETIC  CHANGE how you take these medications      albuterol  90 mcg/actuation inhaler  Commonly known as: PROVENTIL  HFA;VENTOLIN  HFA  Inhale 1 puff every six (6) hours as needed for wheezing.  What changed:   how much to take  Another medication with the same name was removed. Continue taking this medication, and follow the directions you see here.     SPIRIVA  RESPIMAT 2.5 mcg/actuation inhalation mist  Generic drug: tiotropium bromide   Inhale 2 puffs daily.  What changed: Another medication with the same name was removed. Continue taking this medication, and follow the directions you see here.     spironolactone  25 MG tablet  Commonly known as: ALDACTONE   Take 0.5 tablets (12.5 mg total) by mouth daily.  What changed: how much to take     torsemide  20 MG tablet  Commonly known as: DEMADEX   Take 4 tablets (80 mg total) by mouth daily. Take additional 80 mg if 3+ lb weight gain in 24 hours  What changed: additional instructions            CONTINUE taking these medications      ACCU-CHEK GUIDE TEST STRIPS Strp  Generic drug: blood sugar diagnostic  Use to check blood sugar daily     ACCU-CHEK SOFTCLIX LANCETS lancets  Generic drug: lancets  CHECK BLOOD SUGAR DAILY     acetaminophen  500 MG tablet  Commonly known as: TYLENOL   Take 2 tablets (1,000 mg total) by mouth Three (3) times a day as needed for pain.     ADVAIR  HFA 115-21 mcg/actuation inhaler  Generic drug: fluticasone  propion-salmeterol  INHALE 2 PUFFS TWICE A DAY     AEROCHAMBER MV inhaler  Generic drug: inhalational spacing device  1 spacer for inhaler     amiodarone  200 MG tablet  Commonly known as: PACERONE   Take 1 tablet (200 mg total) by mouth daily.     AMVUTTRA  25 mg/0.5 mL injection  Generic drug: vutrisiran   Inject 0.5 mL (25 mg total) under the skin Take as directed. Every 6 Weeks     cranberry 500 mg Cap  Take 500 mg by mouth daily with evening meal.     cyclobenzaprine  10 MG tablet  Commonly known as: FLEXERIL   Take 1 tablet (10 mg total) by mouth two (2) times a day as needed for muscle spasms.     ergocalciferol -1,250 mcg (50,000 unit) 1,250 mcg (50,000 unit) capsule  Commonly known as: DRISDOL   TAKE 1 CAPSULE ONCE A WEEK     ferrous sulfate  325 (65 FE) MG tablet  Commonly known as: FeroSul  Take 1 tablet (325 mg total) by mouth in the  morning.     fosfomycin 3 gram Pack  Commonly known as: MONUROL   Take 3 g by mouth once a week.     gabapentin  100 MG capsule  Commonly known as: NEURONTIN   Take 1 capsule (100 mg total) by mouth two (2) times a day.     LIDOCAINE  PAIN RELIEF  4 % patch  Generic drug: lidocaine   Place 1 patch on the skin daily. Remove after 12 hours.     midodrine  5 MG tablet  Commonly known as: PROAMATINE   Take 1 tablet (5 mg total) by mouth three (3) times a day.     NARCAN 4 mg/actuation nasal spray  Generic drug: naloxone  1 spray into alternating nostrils once as needed (opioid overdose). PRN - Emergency use.     nystatin  100,000 unit/gram powder  Commonly known as: MYCOSTATIN   Apply to affected area 3 times daily     oxyCODONE  5 MG immediate release tablet  Commonly known as: ROXICODONE   Take 1 tablet (5 mg total) by mouth every eight (8) hours as needed for pain.     OXYGEN -AIR DELIVERY SYSTEMS MISC  4 L by Miscellaneous route continuous.     pantoprazole  20 MG tablet  Commonly known as: Protonix   TAKE 1 TABLET(20 MG) BY MOUTH DAILY     rosuvastatin  5 MG tablet  Commonly known as: CRESTOR   TAKE 1 TABLET EVERY OTHER DAY     vitamin A -3,000 mcg RAE (10,000 UNIT) 3,000 mcg RAE (10,000 UNIT) capsule  Take 3,000 mcg of RAE by mouth daily.     VYNDAMAX  61 mg Cap  Generic drug: tafamidis   Take 1 capsule (61 mg) by mouth daily.              Allergies:  Nitrofurantoin and Lipitor  [atorvastatin ]  ______________________________________________________________________  Pending Test Results:  Pending Labs       Order Current Status    Vitamin D  25 Hydroxy (25OH D2 + D3) In process            Most Recent Labs:  All lab results last 24 hours - Recent Results (from the past 24 hours)   hsTroponin I (single, no delta)    Collection Time: 07/30/24  1:31 PM   Result Value Ref Range    hsTroponin I 75 (HH) <=34 ng/L   Basic Metabolic Panel    Collection Time: 07/30/24  1:31 PM   Result Value Ref Range    Sodium 140 135 - 145 mmol/L    Potassium 4.2 3.4 - 4.8 mmol/L    Chloride 97 (L) 98 - 107 mmol/L    CO2 29.5 20.0 - 31.0 mmol/L    Anion Gap 14 5 - 14 mmol/L    BUN 45 (H) 9 - 23 mg/dL    Creatinine 7.83 (H) 0.55 - 1.02 mg/dL    BUN/Creatinine Ratio 21     eGFR [name]-EPI (2021) Female 22 (L) >=60 mL/min/1.83m2    Glucose 95 70 - 179 mg/dL    Calcium  9.1 8.7 - 10.4 mg/dL   Magnesium  Level    Collection Time: 07/30/24  1:31 PM   Result Value Ref Range    Magnesium  2.4 1.6 - 2.6 mg/dL   Phosphorus Level    Collection Time: 07/31/24  5:52 AM   Result Value Ref Range    Phosphorus 3.8 2.4 - 5.1 mg/dL   Magnesium  Level    Collection Time: 07/31/24  5:52 AM   Result Value Ref Range    Magnesium  2.3 1.6 -  2.6 mg/dL   Basic Metabolic Panel    Collection Time: 07/31/24  5:52 AM   Result Value Ref Range    Sodium 142 135 - 145 mmol/L    Potassium 4.0 3.4 - 4.8 mmol/L    Chloride 100 98 - 107 mmol/L    CO2 27.7 20.0 - 31.0 mmol/L    Anion Gap 14 5 - 14 mmol/L    BUN 40 (H) 9 - 23 mg/dL    Creatinine 7.93 (H) 0.55 - 1.02 mg/dL    BUN/Creatinine Ratio 19     eGFR [name]-EPI (2021) Female 24 (L) >=60 mL/min/1.32m2    Glucose 94 70 - 179 mg/dL    Calcium  8.9 8.7 - 10.4 mg/dL   CBC    Collection Time: 07/31/24  5:52 AM   Result Value Ref Range    WBC 9.3 3.6 - 11.2 10*9/L    RBC 3.39 (L) 3.95 - 5.13 10*12/L    HGB 10.4 (L) 11.3 - 14.9 g/dL    HCT 68.9 (L) 65.9 - 44.0 %    MCV 91.6 77.6 - 95.7 fL    MCH 30.6 25.9 - 32.4 pg    MCHC 33.4 32.0 - 36.0 g/dL    RDW 86.0 87.7 - 84.7 %    MPV 8.6 6.8 - 10.7 fL    Platelet 181 150 - 450 10*9/L       Relevant Studies/Radiology:    ______________________________________________________________________  Discharge Instructions:   DIAGNOSIS -- You were admitted to [facility] with shortness of breath, needing more oxygen  than normal, thought to be related to your heart failure. We did some tests and imaging to make sure there was not any other cause of your shortness of breath. You got an Xray which showed some fluid in your lungs and we gave you diuretics to help get that fluid off. After two days of diuretics you improved and were discharged. We gave you your home medications of torsemide , please take it and follow up with your cardiologist.    MEDICATIONS -- Please refer to your after visit summary (AVS) medication list for updates or changes related to your hospital stay.     SIGNS AND SYMPTOMS TO MONITOR -- If you begin to experience Shortness of breath  call 911 or go to the emergency department.        FOLLOW-UP APPOINTMENTS Your Primary Care Doctor is listed as Dr. Myrna. Please be sure to see them in the next 7-10 days.    Your follow-up appointment is scheduled.    Please see the front page of your after visit summary (AVS) for your scheduled [facility] appointments and outstanding referrals. You have been referred to the following specialties listed below.    08/02/2024 4:10 PM (25 min) Sch Arrive by: 3:55 PM RETURN CONTINUITY UNCINTMEDET ([place]) [name], MD                Appointments which have been scheduled for you      Aug 02, 2024 1:30 PM  (Arrive by 1:15 PM)  RETURN HEART FAILURE with Damien Brodie Lee, AGNP  [facility] CARDIOLOGY [place] [place]) [address]  [facility] 1 through 4  [place] [address]  [phone]        Aug 02, 2024 2:30 PM  (Arrive by 2:15 PM)  INFUSION ONLY with UNCTIF 08  [facility] THERAPEUTIC INFUSION CTR [place]) [address]  [place] 1 through  4  [place] [address]  [phone]        Aug 02, 2024 To Be Determined  PT - HOME VISIT with Suzen JAYSON Spar, PT  [facility] AND HOSPICE [facility]) [address]  [place] [address]  [phone]        Aug 04, 2024 To Be Determined  PT - HOME VISIT with Suzen JAYSON Spar, PT  [facility] AND HOSPICE [facility]) [address]  [address]  [place] [address]  [phone]        Aug 08, 2024 To Be Determined  PTA - HOME VISIT with Franky JONELLE Bristol, PTA  [facility] AND HOSPICE [facility]) [address]  [address]  [place] [address]  [phone]        Aug 10, 2024 To Be Determined  PTA - HOME VISIT with Franky JONELLE Bristol, PTA  [facility] AND HOSPICE [facility]) [address]  [place] [address]  [phone]        Aug 15, 2024 To Be Determined  PT - REASSESSMENT with Suzen JAYSON Spar, PT  [facility] AND HOSPICE [facility]) [address]  [place] [address]  [phone]        Sep 11, 2024 1:40 PM  (Arrive by 1:25 PM)  RETURN CONTINUITY with Velia Corena Novak, MD  [facility] INTERNAL MEDICINE [place] [facility] REGION) [address]  [facility] 1 through 4  [place] [address]  [phone]        Sep 14, 2024 11:00 AM  (Arrive by 10:45 AM)  PFT with PFT 1  [facility] PULMONARY SPECIALTY FUNCT [place]) [address]  [place] 1 through 4  [place] [address]  [phone]   If you have inhaled breathing medications, please do not use it 4 hours prior to this breathing test.  Please wear comfortable clothing and well-fitting , closed toe shoes in the even that a 6-minute walk is part of your test.  If you have fallen in the past month please inform your respiratory therapist at the start of your appointment.    While you may bring a guest to your appointment, they will not be able to be present in the testing area.         Sep 14, 2024 12:00 PM  (Arrive by 11:45 AM)  RETURN PULMONARY HYPERTENSION with Heron Jama Hitt, MD  [facility] PULMONARY SPECIALTY CL [place] [facility] REGION) [address]  [facility] 1 through 4  [place] [address]  [phone]             ______________________________________________________________________  Discharge Day Services:  BP 113/65  - Pulse 67  - Temp 36.4 ??C (97.6 ??F) (Temporal)  - Resp 17  - Ht 167.6 cm (5' 6)  - Wt 76.6 kg (168 lb 14.4 oz)  - LMP  (LMP Unknown)  - SpO2 97%  - BMI 27.26 kg/m??     Pt seen on the day of discharge and determined appropriate for discharge.    Condition at Discharge: stable    Length of Discharge: I spent greater than 30 mins in the discharge of this  patient.

## 2024-07-31 NOTE — Unmapped (Signed)
 Care Management  Initial Transition Planning Assessment              General  Care Manager / Social Worker assessed the patient by : Medical record review, Discussion with Clinical Care team  Orientation Level: Oriented X4  Functional level prior to admission: Partially Assisted  Who provides care at home?: Family member  Level of assistance required: Bathing, Eating, Dressing  Reason for referral: Discharge Planning    Contact/Decision Maker  Extended Emergency Contact Information  Primary Emergency Contact: [name],[name]  Address: [address]           [place], [place] [address] [place]  Home Phone: [phone]  Mobile Phone: [phone]  Relation: Daughter    Legal Next of Kin / Guardian / POA / Advance Directives     HCDM (With Legal Document To Support): [name],[name] - Daughter - [phone]    Advance Directive (Medical Treatment)  Does patient have an advance directive covering medical treatment?: Patient has advance directive covering medical treatment, copy in chart.  Reason patient does not have an advance directive covering medical treatment:: Patient does not wish to complete one at this time.    Health Care Decision Maker [HCDM] (Medical & Mental Health Treatment)  Healthcare Decision Maker: HCDM documented in the HCDM/Contact Info section.  Information offered on HCDM, Medical & Mental Health advance directives:: Patient given information.    Advance Directive (Mental Health Treatment)  Does patient have an advance directive covering mental health treatment?: Patient does not have advance directive covering mental health treatment.  Reason patient does not have an advance directive covering mental health treatment:: Patient does not wish to complete one at this time.    Readmission Information    Patient Information  Lives with: Children    Type of Residence: Private residence        Location[address]Detail: [address] [place] [address]  Home Phone: [phone]    Support Systems/Concerns: Family Members, Children    Responsibilities/Dependents at home?: No    Home Care services in place prior to admission?: Yes  Type of Home Care services in place prior to admission: Home PT, Home OT  Current Home Care provider (Name/Phone #): HHPT and new OT recs. Sent request to [facility]    Outpatient/Community Resources in place prior to admission: Clinic  Agency detail (Name/Phone #): PCP Velia Novak    Equipment Currently Used at Home: walker, rolling, respiratory supplies, wheelchair, manual, commode chair  Current HME Agency (Name/Phone #): O2 @ 3lpm, nebulizer, [facility]    Currently receiving outpatient dialysis?: No       Financial Information       Need for financial assistance?: No       Social Determinants of Health  Social Determinants of Health were addressed in provider documentation.  Please refer to patient history.  Social Drivers of Health     Food Insecurity: No Food Insecurity (06/14/2024)    Hunger Vital Sign     Worried About Running Out of Food in the Last Year: Never true     Ran Out of Food in the Last Year: Never true   Tobacco Use: Medium Risk (07/30/2024)    Patient History     Smoking Tobacco Use: Former     Smokeless Tobacco Use: Never     Passive Exposure: Not on file   Transportation Needs: No Transportation Needs (06/14/2024)    PRAPARE - Transportation  Lack of Transportation (Medical): No     Lack of Transportation (Non-Medical): No   Alcohol Use: Not At Risk (03/18/2023)    Alcohol Use     How often do you have a drink containing alcohol?: Never     How many drinks containing alcohol do you have on a typical day when you are drinking?: 1 - 2     How often do you have 5 or more drinks on one occasion?: Never   Housing: Low Risk  (06/14/2024)    Housing     Within the past 12 months, have you ever stayed: outside, in a car, in a tent, in an overnight shelter, or temporarily in someone else's home (i.e. couch-surfing)?: No     Are you worried about losing your housing?: No   Physical Activity: Unknown (11/04/2017)    Received from [facility]    Exercise Vital Sign     Days of Exercise per Week: Patient declined     Minutes of Exercise per Session: Patient declined   Utilities: Low Risk  (11/02/2023)    Utilities     Within the past 12 months, have you been unable to get utilities (heat, electricity) when it was really needed?: No   Stress: Unknown (11/04/2017)    Received from [facility] - Occupational Stress Questionnaire     Feeling of Stress : Patient declined   Interpersonal Safety: Not At Risk (07/30/2024)    Interpersonal Safety     Unsafe Where You Currently Live: No     Physically Hurt by Anyone: No     Abused by Anyone: No   Substance Use: Not on file (06/18/2024)   Intimate Partner Violence: Not At Risk (07/18/2024)    Humiliation, Afraid, Rape, and Kick questionnaire     Fear of Current or Ex-Partner: No     Emotionally Abused: No     Physically Abused: No     Sexually Abused: No   Social Connections: Unknown (11/04/2017)    Received from [facility]    Social Connection and Isolation Panel     Frequency of Communication with Friends and Family: Patient declined     Frequency of Social Gatherings with Friends and Family: Patient declined     Attends Religious Services: Patient declined     Database administrator or Organizations: Patient declined     Attends Banker Meetings: Patient declined     Marital Status: Patient declined   Programmer, applications: Low Risk  (06/14/2024)    Overall Financial Resource Strain (CARDIA)     Difficulty of Paying Living Expenses: Not hard at all   Health Literacy: Medium Risk (11/18/2023)    Health Literacy     : Sometimes   Internet Connectivity: No Internet connectivity concern identified (11/18/2023)    Internet Connectivity     Do you have access to internet services: Yes     How do you connect to the internet: Personal Device at home     Is your internet connection strong enough for you to watch video on your device without major problems?: Yes     Do you have enough data to get through the month?: Yes     Does at least one of the devices have a camera that you can use for video chat?: Yes       Complex Discharge Information    Is  patient identified as a difficult/complex discharge?: No    Discharge Needs Assessment  Concerns to be Addressed: discharge planning, adjustment to diagnosis/illness, other (see comments) (resume home health services)    Clinical Risk Factors: > 65, Principal Diagnosis: Cancer, Stroke, COPD, Heart Failure, AMI, Pneumonia, Joint Replacment, Functional Limitations, Readmission < 30 Days    Barriers to taking medications: No    Prior overnight hospital stay or [name] in last 90 days: Yes    Anticipated Changes Related to Illness: none    Equipment Needed After Discharge: none    Discharge Facility/Level of Care Needs: other (see comments) (home with home health)    Readmission  Risk of Unplanned Readmission Score: UNPLANNED READMISSION SCORE: 28.96%  Predictive Model Details          29% (High)  Factor Value    Calculated 07/31/2024 12:04 17% Number of ED visits in last six months 4    [place] Risk of Unplanned Readmission Model 16% Number of active inpatient medication orders 31     15% Number of hospitalizations in last year 4     7% ECG/EKG order present in last 6 months     6% Latest BUN high (40 mg/dL)     5% Imaging order present in last 6 months     5% Age 37     5% Latest hemoglobin low (10.4 g/dL)     5% Phosphorous result present     4% Charlson Comorbidity Index 4     3% Diagnosis of deficiency anemia present     3% Active anticoagulant inpatient medication order present     3% Latest creatinine high (2.06 mg/dL)     3% Diagnosis of renal failure present     2% Future appointment scheduled     1% Current length of stay 1.544 days     1% Active ulcer inpatient medication order present      Readmitted Within the Last 30 Days? (No if blank)   Patient at risk for readmission?: No    Discharge Plan  Screen findings are: Discharge planning needs identified or anticipated (Comment). [facility] needed)    Expected Discharge Date: 07/31/2024    Expected Transfer from Critical Care:      Quality data for continuing care services shared with patient and/or representative?: Yes  Patient and/or family were provided with choice of facilities / services that are available and appropriate to meet post hospital care needs?: Yes   List choices in order highest to lowest preferred, if applicable. : [facility]    Initial Assessment complete?: Yes

## 2024-08-01 DIAGNOSIS — Z09 Encounter for follow-up examination after completed treatment for conditions other than malignant neoplasm: Principal | ICD-10-CM

## 2024-08-01 LAB — VITAMIN D 25 HYDROXY: VITAMIN D, TOTAL (25OH): 58.3 ng/mL (ref 20.0–80.0)

## 2024-08-01 MED ORDER — ERGOCALCIFEROL (VITAMIN D2) 1,250 MCG (50,000 UNIT) CAPSULE
ORAL_CAPSULE | ORAL | 1 refills | 84.00000 days | Status: CP
Start: 2024-08-01 — End: ?

## 2024-08-02 ENCOUNTER — Ambulatory Visit
Admit: 2024-08-02 | Discharge: 2024-08-02 | Payer: Medicare (Managed Care) | Attending: Adult Health | Primary: Adult Health

## 2024-08-02 ENCOUNTER — Encounter: Admit: 2024-08-02 | Discharge: 2024-08-02 | Payer: Medicare (Managed Care)

## 2024-08-02 DIAGNOSIS — J9611 Chronic respiratory failure with hypoxia: Principal | ICD-10-CM

## 2024-08-02 DIAGNOSIS — I5032 Chronic diastolic (congestive) heart failure: Principal | ICD-10-CM

## 2024-08-02 DIAGNOSIS — D500 Iron deficiency anemia secondary to blood loss (chronic): Principal | ICD-10-CM

## 2024-08-02 DIAGNOSIS — I4819 Other persistent atrial fibrillation: Principal | ICD-10-CM

## 2024-08-02 DIAGNOSIS — B3731 Vaginal candidiasis: Principal | ICD-10-CM

## 2024-08-02 DIAGNOSIS — E852 Heredofamilial amyloidosis, unspecified: Principal | ICD-10-CM

## 2024-08-02 DIAGNOSIS — I43 Cardiomyopathy in diseases classified elsewhere: Principal | ICD-10-CM

## 2024-08-02 DIAGNOSIS — N184 Chronic kidney disease, stage 4 (severe): Principal | ICD-10-CM

## 2024-08-02 DIAGNOSIS — E854 Organ-limited amyloidosis: Principal | ICD-10-CM

## 2024-08-02 DIAGNOSIS — R052 Subacute cough: Principal | ICD-10-CM

## 2024-08-02 MED ORDER — FLUCONAZOLE 150 MG TABLET
ORAL_TABLET | Freq: Once | ORAL | 0 refills | 1.00000 days | Status: CP
Start: 2024-08-02 — End: 2024-08-02

## 2024-08-02 MED ORDER — TORSEMIDE 20 MG TABLET
ORAL_TABLET | Freq: Two times a day (BID) | ORAL | 11 refills | 30.00000 days
Start: 2024-08-02 — End: 2025-08-02

## 2024-08-02 MED ADMIN — vutrisiran (AMVUTTRA) syringe 25 mg: 25 mg | SUBCUTANEOUS | @ 19:00:00 | Stop: 2024-08-02

## 2024-08-02 NOTE — Unmapped (Signed)
 Patient presents for Amvuttra  (vutrisiran ) injection.  VSS. Education material given to patient and educated on potential side effects, patient verbalizes understanding.  Patient presents with her daughter and on 3 liters oxygen  continuous via [place]. Patient denies any medical changes or new concerns today.      Amvuttra  25 mg administered subcutaneously to left upper arm, band-aid applied to site.  Patient tolerated with no issues.  Patient discharged from infusion clinic with 12 week appt to coincide with cardiology appointment.

## 2024-08-02 NOTE — Unmapped (Signed)
 VISIT SUMMARY:    Barbara Huber, you had a follow-up appointment today after your recent hospitalization for a heart failure exacerbation. Your weight is stable, and your breathing is good. We discussed your persistent cough, vaginal itching, and chronic left leg swelling. Your current medications and treatments were reviewed and adjusted as needed.    YOUR PLAN:    -CHRONIC HEART FAILURE: Chronic heart failure means your heart doesn't pump blood as well as it should. Your weight is stable, and your breathing is good. Continue your current diuretic regimen as instructed by Damien. We will check your kidney function in two weeks at [facility].    -CHRONIC OBSTRUCTIVE PULMONARY DISEASE (COPD): COPD is a lung condition that makes it hard to breathe. You are dependent on oxygen , and your recent chest X-ray showed some fluid in your lungs. Continue using your oxygen  as needed.    -CHRONIC KIDNEY DISEASE ([name]): [name] means your kidneys are not working as well as they should. We will check your kidney function in two weeks at [facility] to ensure the increased diuretic dosage is not affecting your kidneys.    -CHRONIC COUGH: You have a persistent cough that hasn't improved. We recommend starting Augmentin  and using over-the-counter cough syrup as needed.    -VAGINAL CANDIDIASIS: Vaginal candidiasis is a yeast infection causing itching and redness. We prescribed fluconazole , one tablet, and recommend using nystatin  powder externally. You can also use over-the-counter clotrimazole cream for external relief.    -CHRONIC LEFT LEG EDEMA: Chronic left leg edema means your left leg is more swollen than the right. This is a chronic issue for you, and we will continue to monitor it.    INSTRUCTIONS:    Please follow up in two weeks at [facility] for a kidney function test. Continue your current medications and treatments as discussed. If you have any new or worsening symptoms, contact our office.

## 2024-08-02 NOTE — Unmapped (Signed)
 [facility] Note    Referring Provider: [name], MD  [phone] [facility] [address] [place],  [place] [address]   Primary Provider: [name], MD  [address]  [place] [address]   Other Providers:  Dr Barbara Huber, Dr Barbara Huber    Reason for Visit:  Barbara Huber is a 83 y.o. female being seen for routine visit and continued care of cardiac amyloidosis .    Assessment & Plan:  1. 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 SPEP/UPEP/free light chains without concern. 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 Daughter is informed and has seen cardiology Barbara Huber).   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 2nd injection July 2024, 3rd in November 2024, 4th in January 2025, 5th in April. Continue vitamin A  supplementation.   - DC'd Jardiance  10mg  daily due to recurrent UTIs  - She has mild LE edema and mildly elevated JVP; increase torsemide  to 60mg  BID (from 80mg  daily)  - metolazone  PRN  - spironolactone  12.5mg  daily  - Continue KCl 20mEq daily  - Taking midodrine  5mg  TID with BP in 90-110s/60-70s at home.   - Continue Tafamidis  61mg  daily and Amvuttra  q30mos. Consider switch to acoramidis for worsening symptoms.   - She has not been interested in CardioMEMS  1b. Goals of Care  - Had a discussion at prior visit w/ pt and daughter Barbara Huber regarding pt's goals of care.   We discussed role of palliative care (has seen Dr Blondie) and hospice (when appropriate). She may wish to return to hospital if needed, but likely not ICU level care or invasive monitoring.    2. Persistent atrial fibrillation  - CHA2DS2-VASc score = 5 (1-gender, 2-Age, 1-DM, 1-HTN)  - We had previously stopped Eliquis  given recurrent anemia, shared decision making w/ pt and daughter. She was not interested in [employer] at the time.   - Eliquis  2.5mg  BID restarted during hospitalization - with subsequent decrease in hemoglobin, thus stopped again in mid July.  - s/p DCCV on 11/17/23 and again 03/20/24  - On amiodarone  200mg  daily  - Mild hyperthyroid being managed by PCP/methimazole   - For recurrent AF, she wouldn't want invasive procedures (like PPM and AVN ablation)    3. [name]  - Cr baseline around 2.3-3.0  - most recent Cr 2.06    4. COPD, OSA  - home O2 3-4L [place] - followed by pulm  - on Advair  and Spiriva   - Uses trilogy at night     5. Iron  deficiency anemia  - s/p IV iron  while inpatient  - as above, off Eliquis   Lab Results   Component Value Date    FERRITIN 79.0 07/29/2024     Lab Results   Component Value Date    HGB 10.4 (L) 07/31/2024     6. Anxiety  - previously on Zoloft   - Tried Remeron , but discontinued in setting of worsening HF symptoms    7. DM  - Off Insulin  with A1c <7  - Followed by PCP   Lab Results   Component  Value Date    A1C 4.8 03/03/2024     8. Chronic back pain  - follows with pain clinic, on oxycodone   - gabapentin  BID PRN   - Continue flexeril  10mg  PRN nightly, has been tolerating this dose    Follow-up:  Return in about 1 month (around 09/02/2024) for Return HF.     History of Present Illness:  Barbara Huber is a 83 y.o. female with PMHx of COPD on 3L home [place], CHF, [name], [name], [name] She was initially admitted with decompensated [name] in 2021 in setting of  Septic Shock d/t Gangrenous Cholecystitis s/p cholecystectomy. She underwent further evaluation with PYP scan which showed TTR amyloid. She has since been followed in clinic for management of her [name]. In 2022, had numerous admissions for weakness/urosepsis, shortness of breath/orthopnea/weight gain, lightheadedness/SOB/Blurry vision found to be anemic.    Summary of most recent visits:  - 01/16/22 [facility]): Feeling well from a cardiac standpoint. Started Zoloft  for anxiety  - 02/13/22 [facility]): Weight 210-211. Feeling well. Taking extra torsemide  20mg  about every other day, which was stopped for elevated Cr.   - 03/13/22 ([name]): Weight 201-216, took metolazone  PRN, weight stabilized at 210lbs. Considering cardioMEMS.   - 05/13/22 ([name]): Had 3 iron  infusions. Doing well w/ volume, weight 211-212lbs. Cooking, staying active around house.   - 05/21/22 ([name]): CP/SOB. Weight 217 from 213. She took metolazone  x1 dose.   - 05/22/22 ([name]): Ed follow up. Back down to 213lbs. Still having CP w/ certain movement.   - 06/05/22 ([name]): Weight 214-216lb, should be 212lb.   - 06/09/22, 06/11/22, 06/22/22 ([name]): Weight down from 217 to 213lb, 211lb at home. Feeling well by 6/26.   - 07/15/22 ([name]): Switched inhaler back to Advair /Spiriva  with improvement in breathing and O2 sat. Weight 207lbs, feeling more like herself.   - 08/19/22 ([name]): Feeling well. Weight 208-210lbs.   - 10/22/22 ([name]): Feeling well. Weight has been 210-214lbs.   - 12/04/22 [facility]): doing well overall, weight a bit up so taking extra torsemide .   - 02/10/23 ([name]): Weight 213-214lbs, feeling well, eating a lot. Had taken metolazone  earlier in the week.   - 02/23/23 [facility]): Hospital follow up after mechanical fall with nondisplaced fractures of the left superior and inferior pubic rami. Taking torsemide  40mg , weight 211.   - 04/14/23 ([name]): Weight 210-212. 1st Amvuttra   - 07/14/23 ([name]): Weight 211-212 on torsemide  60mg  daily. 2nd Amvuttra .   - 08/18/23-08/23/23 (Admission): fatigue, weakness, DOE, recent prednisone course for gout. Admitted to [facility], poor response to IV Lasix  and was changed to IV Bumex  3mg  BID and metolazone . Spiro held for low BP. Cr increased to 4.0 with diuresis. She went back to the ER on 8/24 for abdominal pain. She was felt to be euvolemic at a weight of 207lb and IV diuretics were held. She was discharged on torsemide  60mg  daily on 8/26.   - 08/27/23 [facility]): hospital follow up. Felt to be dry for weight down to 204 at home, Cr 3.4 BUN 96. Changed torsemide  from 60 daily to 40/20.   - 08/30/23-09/02/23 (Admission): Admitted for fatigue and SOB. Diuresed  with improvement in dyspnea. Weight on discharge was 204.   - 09/06/23 [facility]): Hospital follow up. Her weight continued to decrease on her scale by about 1lb per day. Weight 201 that morning. She was a little lightheaded and felt weak. Continued on torsemide  60mg  daily.  Cr 2.61, pro-BNP 4575  - 09/08/23 ([name]/Phone):  Weight 201, breathing well.   - 09/16/23 (L[facility]): Weight 205, not peeing much with torsemide  60mg . Feeling full in belly. Cr 3.11,  pro-BNP 14492. Metolazone  and extra 40 torsemide .   - 09/17/23 ([facility]): Weight 203, feeling better. Continue torsemide  60/40 through weekend.   - 09/23/23 ([name]): n 9/25 reporting hat she didn't pee much the night before and weight was 203lb that morning. Increased by 2 lbs overnight. Instructed to take metolazone  2.5mg  and extra 40 torsemide  in the afternoon. Today her weight was up and so she requested a visit. She didn't urinate much and this morning was 205lbs. Extra torsemide  60mg  then metolazone  in morning and return for diuresis clinic.   - 09/24/23 (Diuresis): IV Lasix . Arranged for direct admission.   - 9/27-10/5 (Admission): She initially underwent inotrope assisted diuresis with dopamine  and furosemide  gtt, which was stopped on 9/30 after episodes of hypotension requiring NE and midodrine . Norepinephrine  stopped and pulmonary wedge pressure was 12 prior to pulling swan on on 10/1. Midodrine  maintained to maximize preload. Furosemide  gtt transitioned to spot IV lasix  and then was transitioned to home torsemide  60mg  daily on 10/2. Palliative care was consulted and family decided on outpatient evaluation. She was discharged with a pro-BNP of 17K down from 26K and weight of 209lb. Discharged with new home regimen torsemide  60mg  daily, metolazone  prn per clinic, midodrine  5mg  TID, Tafamidis  61mg  daily. She was continued on chronic home amiodarone  200 mg daily for atrial fibrillation and restarted on Eliquis  2.5mg  BID.   - 10/9/242 [facility]): hospital follow up. Weight 193-195. Torsemide  60mg g daily. In AF RVR by EKG. Amio increased to 200mg  BID.   - 10/27/23 ([name]): Weight up to 201 but had been 196-197.  - 11/11/23 [facility]): Hospital follow up, [name] for hypoxia 2/2 malfunction of portable O2. Weight 194 on torsemide  60mg .  - 11/17/23 (EP Procedure): Cardioversion  - 12/15/23 ([name]): Weight 186-189 on torsemide  60mg . Pro-BNP up. Increased torsemide  to 80mg .  - 01/26/24 ([name]): Weight as low as 181 on torsemide  80mg . No med changes.   - 03/13/24 ([name]): in AF. Scheduled for cardioversion.   - 04/26/24 [facility]): doing well, weight stable 175-176 on torsemide  80 daily. Remained in NSR. Had started methimazole  for hyperthyroid.    Interval history  Today she presents for follow up. She has unfortunately had 2 admission since our last visit, 7/8-7/9 for weakness, felt to be dehydrated after increased oral diuretics at home. She was again admitted 8/2-8/4 for hypoxia and generalized weakness likely [name] exacerbation iso acute illness. She was 3-5 lbs above her dry weight with admission, given two doses of IV lasix  with robust urine output. On day of discharge she was at her dry weight of 169 lbs, and discharged on her home torsemide  dose of 80mg  daily. Since being home, she has felt pretty good. Her weight was 168 when she got home and 169 this morning. Her weight never really went up with the fluid gain prior to admission last week, but her stomach felt a little tight. Stomach doesn't feel tight today. She denies increased dyspnea. She does have some mild ankle/foot edema. She still has a mucousy cough which doesn't seem to be improving. Cardiovascular History & Procedures:    Cath / PCI:  none    CV Surgery:   none    EP Procedures and Devices:  03/20/24 - DCCV  11/17/23 - DCCV  01/10/21 - DCCV    Non-Invasive Evaluation(s):    Echo:  08/19/23  1. The left ventricle  is normal in size with moderately increased wall  thickness.    2. 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    3. The left ventricular systolic function is hyperdynamic, LVEF is visually  estimated at >70%.    4. Mitral annular calcification is present (moderate).    5. The mitral valve leaflets are mildly thickened with normal leaflet  mobility.    6. The aortic valve is trileaflet with mildly thickened leaflets with normal  excursion.    7. The left atrium is moderately dilated in size.    8. The right ventricle is normal in size, with normal systolic function.    02/17/23    1. The left ventricle is normal in size with moderately increased wall  thickness.    2. The left ventricular systolic function is hyperdynamic, LVEF is visually  estimated at >70%.    3. The left atrium is moderately dilated in size.    4. The right ventricle is normal in size, with normal systolic function.    5. There is mild pulmonary hypertension.    12/09/21  Summary    1. The left ventricle is normal in size with severely increased wall  thickness.    2. The left ventricular systolic function is hyperdynamic, LVEF is visually  estimated at >70%.    3. There is grade III diastolic dysfunction (severely elevated filling  pressure).    4. The left atrium is moderately to severely dilated in size.    5. The right ventricle is mildly dilated in size, with normal systolic  function.    6. The right atrium is mildly dilated  in size.    7. IVC size and inspiratory change suggest elevated right atrial pressure.  (10-20 mmHg).    04/24/21   1. The left ventricle is normal in size with mildly increased wall  thickness.    2. The left ventricular systolic function is normal, LVEF is visually  estimated at > 55%.    3. There is grade II diastolic dysfunction (elevated filling pressure).    4. Mitral annular calcification is present (mild).    5. The left atrium is mildly dilated in size.    6. The right ventricle is mildly dilated in size, with low normal systolic  function.    7. IVC size and inspiratory change suggest elevated right atrial pressure.  (10-20 mmHg).    09/30/20  Summary    1. Limited study to assess systolic function.    2. IVC size and inspiratory change suggest normal right atrial pressure.  (0-5 mmHg).    3. The left ventricle is normal in size with mildly to moderately increased  wall thickness.    4. The left ventricular systolic function is normal with no obvious wall  motion abnormalities, LVEF is visually estimated at > 55%.    5. Mitral annular calcification is present.    6. The left atrium is mildly dilated in size.    7. The right ventricle is upper normal in size, with reduced systolic  Function.    Cardiac CT/MRI/Nuclear Tests:   None    6 Minute Walk:  None    Cardiopulmonary Stress Tests:   None               Other Past Medical History:  See below for the complete EPIC list of past medical and surgical history.      Allergies:  Nitrofurantoin and Lipitor  [atorvastatin ]  Current Medications:  Current Outpatient Medications   Medication Sig Dispense Refill    acetaminophen  (TYLENOL ) 500 MG tablet Take 2 tablets (1,000 mg total) by mouth Three (3) times a day as needed for pain.      albuterol  HFA 90 mcg/actuation inhaler Inhale 1 puff every six (6) hours as needed for wheezing. 8 g 3    amiodarone  (PACERONE ) 200 MG tablet Take 1 tablet (200 mg total) by mouth daily. 30 tablet 11    blood sugar diagnostic (ACCU-CHEK GUIDE TEST STRIPS) Strp Use to check blood sugar daily 50 strip 3    cranberry 500 mg cap Take 500 mg by mouth daily with evening meal.      cyclobenzaprine  (FLEXERIL ) 10 MG tablet Take 1 tablet (10 mg total) by mouth two (2) times a day as needed for muscle spasms. 60 tablet 11    ergocalciferol -1,250 mcg, 50,000 unit, (DRISDOL ) 1,250 mcg (50,000 unit) capsule Take 1 capsule (1,250 mcg total) by mouth every fourteen (14) days. 6 capsule 1    ferrous sulfate  (FEROSUL) 325 (65 FE) MG tablet Take 1 tablet (325 mg total) by mouth in the morning. 90 tablet 1    fluticasone  propion-salmeterol (ADVAIR  HFA) 115-21 mcg/actuation inhaler INHALE 2 PUFFS TWICE A DAY 12 g 5    fosfomycin (MONUROL ) 3 gram Pack Take 3 g by mouth once a week. 4 packet 6    gabapentin  (NEURONTIN ) 100 MG capsule Take 1 capsule (100 mg total) by mouth two (2) times a day. 60 capsule 11    inhalational spacing device (AEROCHAMBER MV) Spcr 1 spacer for inhaler 1 each 0    lancets (ACCU-CHEK SOFTCLIX LANCETS) Misc CHECK BLOOD SUGAR DAILY 200 each 11    lidocaine  (ASPERCREME) 4 % patch Place 1 patch on the skin daily. Remove after 12 hours. 30 patch 2    midodrine  (PROAMATINE ) 5 MG tablet Take 1 tablet (5 mg total) by mouth three (3) times a day. 90 tablet 11    NARCAN 4 mg/actuation nasal spray 1 spray into alternating nostrils once as needed (opioid overdose). PRN - Emergency use.      nystatin  (MYCOSTATIN ) 100,000 unit/gram powder Apply to affected area 3 times daily 30 g 1    oxyCODONE  (ROXICODONE ) 5 MG immediate release tablet Take 1 tablet (5 mg total) by mouth every eight (8) hours as needed for pain.      OXYGEN -AIR DELIVERY SYSTEMS MISC 4 L by Miscellaneous route continuous.      pantoprazole  (PROTONIX ) 20 MG tablet TAKE 1 TABLET(20 MG) BY MOUTH DAILY 90 tablet 3    rosuvastatin  (CRESTOR ) 5 MG tablet TAKE 1 TABLET EVERY OTHER DAY 45 tablet 3    spironolactone  (ALDACTONE ) 25 MG tablet Take 0.5 tablets (12.5 mg total) by mouth daily. 30 tablet 2    tafamidis  (VYNDAMAX ) 61 mg cap Take 1 capsule (61 mg) by mouth daily. 30 capsule 11    tiotropium bromide  (SPIRIVA  RESPIMAT) 2.5 mcg/actuation inhalation mist Inhale 2 puffs daily. 4 g 3    vitamin A -3,000 mcg RAE, 10,000 UNIT, 3,000 mcg RAE (10,000 UNIT) capsule Take 3,000 mcg of RAE by mouth daily.      vutrisiran  (AMVUTTRA ) 25 mg/0.5 mL injection Inject 0.5 mL (25 mg total) under the skin Take as directed. Every 6 Weeks      torsemide  (DEMADEX ) 20 MG tablet Take 3 tablets (60 mg total) by mouth two (2) times a day. 180 tablet 11     No current facility-administered medications  for this visit.       Family History:  The patient's family history includes Cancer in her father; Hypertension in her mother; No Known Problems in her brother, brother, maternal aunt, maternal grandfather, maternal grandmother, maternal uncle, paternal aunt, paternal grandfather, paternal grandmother, paternal uncle, sister, sister, and another family member.    Social history:  She  reports that she quit smoking about 6 years ago. Her smoking use included cigarettes. She has never used smokeless tobacco. She reports that she does not drink alcohol and does not use drugs.    Review of Systems:  As per HPI.  Rest of the review of ten systems is negative or unremarkable except as stated above.    Physical Exam:  VITAL SIGNS:   Vitals:    08/02/24 1400   BP: 113/56   Pulse: 73   SpO2:      Wt Readings from Last 3 Encounters:   08/02/24 76.7 kg (169 lb)   08/02/24 76.2 kg (168 lb)   07/31/24 76.6 kg (168 lb 14.4 oz)      Today's Body mass index is 27.12 kg/m??.   Height: 167.6 cm (5' 6)  CONSTITUTIONAL: well-appearing in no acute distress  EYES: Conjunctivae and sclerae clear and anicteric.  ENT: Benign.   CARDIOVASCULAR: JVP 1/3 up neck with HOB at 90degrees. Rate and rhythm are regular.  There is no lifts or heaves.  Normal S1, S2. There is no murmur, gallops or rubs.  Radial and pedal pulses are 2+, bilaterally.   There is 1-2+ L>R edema to ankles and feet.  RESPIRATORY: Decreased breath sounds at bases.  There are no wheezes.  GASTROINTESTINAL: Soft, non-tender, with audible bowel sounds. Abdomen nondistended.  Liver is nonpalpable.  SKIN: No rashes, ecchymosis or petechiae.  Warm, well perfused.   MUSCULOSKELETAL:  no joint swelling   NEURO/PSYCH: Appropriate mood and affect. Alert and oriented to person, place, and time. No gross motor or sensory deficits evident.    Pertinent Laboratory Studies:   Admission on 07/29/2024, Discharged on 07/31/2024   Component Date Value Ref Range Status    EKG Ventricular Rate 07/29/2024 91  BPM Final    EKG Atrial Rate 07/29/2024 277  BPM Final    EKG QRS Duration 07/29/2024 110  ms Final    EKG Q-T Interval 07/29/2024 384  ms Final    EKG QTC Calculation 07/29/2024 472  ms Final    EKG Calculated R Axis 07/29/2024 182  degrees Final    EKG Calculated T Axis 07/29/2024 23  degrees Final    QTC Fredericia 07/29/2024 441  ms Final    SARS-CoV-2 PCR 07/29/2024 Negative  Negative Final    Influenza A 07/29/2024 Negative  Negative Final    Influenza B 07/29/2024 Negative  Negative Final    RSV 07/29/2024 Negative  Negative Final    PRO-BNP 07/29/2024 6,303.0 (H)  <=300.0 pg/mL Final    Sodium 07/29/2024 138  135 - 145 mmol/L Final    Potassium 07/29/2024 4.5  3.4 - 4.8 mmol/L Final    Chloride 07/29/2024 96 (L)  98 - 107 mmol/L Final    CO2 07/29/2024 26.7  20.0 - 31.0 mmol/L Final    Anion Gap 07/29/2024 15 (H)  5 - 14 mmol/L Final    BUN 07/29/2024 43 (H)  9 - 23 mg/dL Final    Creatinine 91/97/7974 2.13 (H)  0.55 - 1.02 mg/dL Final    BUN/Creatinine Ratio 07/29/2024 20   Final  eGFR [name]-EPI (2021) Female 07/29/2024 23 (L)  >=60 mL/min/1.60m2 Final    Glucose 07/29/2024 99  70 - 179 mg/dL Final    Calcium  07/29/2024 8.7  8.7 - 10.4 mg/dL Final    hsTroponin I 91/97/7974 58 (HH)  <=34 ng/L Final    EKG Ventricular Rate 07/29/2024 75  BPM Final    EKG QRS Duration 07/29/2024 118  ms Final    EKG Q-T Interval 07/29/2024 436  ms Final    EKG QTC Calculation 07/29/2024 486  ms Final    EKG Calculated R Axis 07/29/2024 177  degrees Final    EKG Calculated T Axis 07/29/2024 27  degrees Final    QTC Fredericia 07/29/2024 469  ms Final    WBC 07/29/2024 11.6 (H)  3.6 - 11.2 10*9/L Final    RBC 07/29/2024 3.60 (L)  3.95 - 5.13 10*12/L Final    HGB 07/29/2024 10.9 (L)  11.3 - 14.9 g/dL Final    HCT 91/97/7974 33.0 (L)  34.0 - 44.0 % Final    MCV 07/29/2024 91.6  77.6 - 95.7 fL Final    MCH 07/29/2024 30.2  25.9 - 32.4 pg Final    MCHC 07/29/2024 33.0  32.0 - 36.0 g/dL Final    RDW 91/97/7974 14.0  12.2 - 15.2 % Final    MPV 07/29/2024 8.9  6.8 - 10.7 fL Final    Platelet 07/29/2024 208  150 - 450 10*9/L Final    nRBC 07/29/2024 0  <=4 /100 WBCs Final    Neutrophils % 07/29/2024 79.1  % Final    Lymphocytes % 07/29/2024 10.2  % Final    Monocytes % 07/29/2024 9.8  % Final    Eosinophils % 07/29/2024 0.1  % Final    Basophils % 07/29/2024 0.8  % Final    Absolute Neutrophils 07/29/2024 9.2 (H)  1.8 - 7.8 10*9/L Final    Absolute Lymphocytes 07/29/2024 1.2  1.1 - 3.6 10*9/L Final    Absolute Monocytes 07/29/2024 1.1 (H)  0.3 - 0.8 10*9/L Final    Absolute Eosinophils 07/29/2024 0.0  0.0 - 0.5 10*9/L Final    Absolute Basophils 07/29/2024 0.1  0.0 - 0.1 10*9/L Final    Ferritin 07/29/2024 79.0  7.3 - 270.7 ng/mL Final    Albumin 07/30/2024 2.5 (L)  3.4 - 5.0 g/dL Final    Total Protein 07/30/2024 6.2  5.7 - 8.2 g/dL Final    Total Bilirubin 07/30/2024 0.4  0.3 - 1.2 mg/dL Final    Bilirubin, Direct 07/30/2024 0.20  0.00 - 0.30 mg/dL Final    AST 91/96/7974 28  <=34 U/L Final    ALT 07/30/2024 10  10 - 49 U/L Final    Alkaline Phosphatase 07/30/2024 121 (H)  46 - 116 U/L Final    Phosphorus 07/30/2024 3.9  2.4 - 5.1 mg/dL Final    Magnesium  07/30/2024 2.3  1.6 - 2.6 mg/dL Final    Sodium 91/96/7974 140  135 - 145 mmol/L Final    Potassium 07/30/2024 4.0  3.4 - 4.8 mmol/L Final    Chloride 07/30/2024 98  98 - 107 mmol/L Final    CO2 07/30/2024 29.0  20.0 - 31.0 mmol/L Final    Anion Gap 07/30/2024 13  5 - 14 mmol/L Final    BUN 07/30/2024 46 (H)  9 - 23 mg/dL Final    Creatinine 91/96/7974 2.18 (H)  0.55 - 1.02 mg/dL Final    BUN/Creatinine Ratio 07/30/2024 21 Final    eGFR  [name]-EPI (2021) Female 07/30/2024 22 (L)  >=60 mL/min/1.76m2 Final    Glucose 07/30/2024 76  70 - 179 mg/dL Final    Calcium  07/30/2024 8.8  8.7 - 10.4 mg/dL Final    WBC 91/96/7974 8.9  3.6 - 11.2 10*9/L Final    RBC 07/30/2024 3.40 (L)  3.95 - 5.13 10*12/L Final    HGB 07/30/2024 10.3 (L)  11.3 - 14.9 g/dL Final    HCT 91/96/7974 31.4 (L)  34.0 - 44.0 % Final    MCV 07/30/2024 92.2  77.6 - 95.7 fL Final    MCH 07/30/2024 30.2  25.9 - 32.4 pg Final    MCHC 07/30/2024 32.7  32.0 - 36.0 g/dL Final    RDW 91/96/7974 14.0  12.2 - 15.2 % Final    MPV 07/30/2024 9.0  6.8 - 10.7 fL Final    Platelet 07/30/2024 168  150 - 450 10*9/L Final    nRBC 07/30/2024 0  <=4 /100 WBCs Final    Neutrophils % 07/30/2024 72.7  % Final    Lymphocytes % 07/30/2024 13.3  % Final    Monocytes % 07/30/2024 12.5  % Final    Eosinophils % 07/30/2024 1.0  % Final    Basophils % 07/30/2024 0.5  % Final    Absolute Neutrophils 07/30/2024 6.5  1.8 - 7.8 10*9/L Final    Absolute Lymphocytes 07/30/2024 1.2  1.1 - 3.6 10*9/L Final    Absolute Monocytes 07/30/2024 1.1 (H)  0.3 - 0.8 10*9/L Final    Absolute Eosinophils 07/30/2024 0.1  0.0 - 0.5 10*9/L Final    Absolute Basophils 07/30/2024 0.0  0.0 - 0.1 10*9/L Final    TSH 07/29/2024 4.182  0.550 - 4.780 uIU/mL Final    Cystatin C 07/29/2024 3.75 (H)  0.64 - 1.23 mg/L Final    eGFR [name]-EPI (2012) Cystatin C Fem* 07/29/2024 11 (L)  >=60 mL/min/1.53m2 Final    Vitamin D  Total (25OH) 07/30/2024 58.3  20.0 - 80.0 ng/mL Final    hsTroponin I 07/30/2024 71 (HH)  <=34 ng/L Final    EKG Ventricular Rate 07/30/2024 64  BPM Incomplete    EKG Atrial Rate 07/30/2024 64  BPM Incomplete    EKG P-R Interval 07/30/2024 210  ms Incomplete    EKG QRS Duration 07/30/2024 118  ms Incomplete    EKG Q-T Interval 07/30/2024 440  ms Incomplete    EKG QTC Calculation 07/30/2024 453  ms Incomplete    EKG Calculated P Axis 07/30/2024 59  degrees Incomplete    EKG Calculated R Axis 07/30/2024 266  degrees Incomplete    EKG Calculated T Axis 07/30/2024 64  degrees Incomplete    QTC Fredericia 07/30/2024 449  ms Incomplete    hsTroponin I 07/30/2024 75 (HH)  <=34 ng/L Final    Sodium 07/30/2024 140  135 - 145 mmol/L Final    Potassium 07/30/2024 4.2  3.4 - 4.8 mmol/L Final    Chloride 07/30/2024 97 (L)  98 - 107 mmol/L Final    CO2 07/30/2024 29.5  20.0 - 31.0 mmol/L Final    Anion Gap 07/30/2024 14  5 - 14 mmol/L Final    BUN 07/30/2024 45 (H)  9 - 23 mg/dL Final    Creatinine 91/96/7974 2.16 (H)  0.55 - 1.02 mg/dL Final    BUN/Creatinine Ratio 07/30/2024 21   Final    eGFR [name]-EPI (2021) Female 07/30/2024 22 (L)  >=60 mL/min/1.32m2 Final    Glucose 07/30/2024 95  70 - 179 mg/dL  Final    Calcium  07/30/2024 9.1  8.7 - 10.4 mg/dL Final    Magnesium  07/30/2024 2.4  1.6 - 2.6 mg/dL Final    Phosphorus 91/95/7974 3.8  2.4 - 5.1 mg/dL Final    Magnesium  07/31/2024 2.3  1.6 - 2.6 mg/dL Final    Sodium 91/95/7974 142  135 - 145 mmol/L Final    Potassium 07/31/2024 4.0  3.4 - 4.8 mmol/L Final    Chloride 07/31/2024 100  98 - 107 mmol/L Final    CO2 07/31/2024 27.7  20.0 - 31.0 mmol/L Final    Anion Gap 07/31/2024 14  5 - 14 mmol/L Final    BUN 07/31/2024 40 (H)  9 - 23 mg/dL Final    Creatinine 91/95/7974 2.06 (H)  0.55 - 1.02 mg/dL Final    BUN/Creatinine Ratio 07/31/2024 19   Final    eGFR [name]-EPI (2021) Female 07/31/2024 24 (L)  >=60 mL/min/1.86m2 Final    Glucose 07/31/2024 94  70 - 179 mg/dL Final    Calcium  07/31/2024 8.9  8.7 - 10.4 mg/dL Final    WBC 91/95/7974 9.3  3.6 - 11.2 10*9/L Final    RBC 07/31/2024 3.39 (L)  3.95 - 5.13 10*12/L Final    HGB 07/31/2024 10.4 (L)  11.3 - 14.9 g/dL Final    HCT 91/95/7974 31.0 (L)  34.0 - 44.0 % Final    MCV 07/31/2024 91.6  77.6 - 95.7 fL Final    MCH 07/31/2024 30.6  25.9 - 32.4 pg Final    MCHC 07/31/2024 33.4  32.0 - 36.0 g/dL Final    RDW 91/95/7974 13.9  12.2 - 15.2 % Final    MPV 07/31/2024 8.6  6.8 - 10.7 fL Final    Platelet 07/31/2024 181  150 - 450 10*9/L Final   Office Visit on 07/18/2024   Component Date Value Ref Range Status    Sodium 07/18/2024 140  135 - 145 mmol/L Final    Potassium 07/18/2024 4.3  3.4 - 4.8 mmol/L Final    Chloride 07/18/2024 100  98 - 107 mmol/L Final    CO2 07/18/2024 29.9  20.0 - 31.0 mmol/L Final    Anion Gap 07/18/2024 10  5 - 14 mmol/L Final    BUN 07/18/2024 48 (H)  9 - 23 mg/dL Final    Creatinine 92/77/7974 1.85 (H)  0.55 - 1.02 mg/dL Final    BUN/Creatinine Ratio 07/18/2024 26   Final    eGFR [name]-EPI (2021) Female 07/18/2024 27 (L)  >=60 mL/min/1.32m2 Final    Glucose 07/18/2024 131  70 - 179 mg/dL Final    Calcium  07/18/2024 8.7  8.7 - 10.4 mg/dL Final    PRO-BNP 92/77/7974 4,867.0 (H)  <=300.0 pg/mL Final    Ferritin 07/18/2024 90.3  7.3 - 270.7 ng/mL Final   Appointment on 07/11/2024   Component Date Value Ref Range Status    Iron  07/11/2024 19 (L)  50 - 170 ug/dL Final    TIBC 92/84/7974 263.3  250.0 - 425.0 ug/dL Final    Transferrin 92/84/7974 209.0 (L)  250.0 - 380.0 mg/dL Final    Iron  Saturation (%) 07/11/2024 7  % Final    Sodium 07/11/2024 139  135 - 145 mmol/L Final    Potassium 07/11/2024 4.4  3.4 - 4.8 mmol/L Final    Chloride 07/11/2024 96 (L)  98 - 107 mmol/L Final    CO2 07/11/2024 27.1  20.0 - 31.0 mmol/L Final    Anion Gap 07/11/2024 16 (H)  5 -  14 mmol/L Final    BUN 07/11/2024 69 (H)  9 - 23 mg/dL Final    Creatinine 92/84/7974 2.43 (H)  0.55 - 1.02 mg/dL Final    BUN/Creatinine Ratio 07/11/2024 28   Final    eGFR [name]-EPI (2021) Female 07/11/2024 19 (L)  >=60 mL/min/1.4m2 Final    Glucose 07/11/2024 156  70 - 179 mg/dL Final    Calcium  07/11/2024 8.8  8.7 - 10.4 mg/dL Final    WBC 92/84/7974 7.6  3.6 - 11.2 10*9/L Final    RBC 07/11/2024 3.07 (L)  3.95 - 5.13 10*12/L Final    HGB 07/11/2024 9.5 (L)  11.3 - 14.9 g/dL Final    HCT 92/84/7974 28.9 (L)  34.0 - 44.0 % Final    MCV 07/11/2024 94.4  77.6 - 95.7 fL Final    MCH 07/11/2024 31.1  25.9 - 32.4 pg Final    MCHC 07/11/2024 32.9  32.0 - 36.0 g/dL Final    RDW 92/84/7974 16.5 (H)  12.2 - 15.2 % Final    MPV 07/11/2024 8.1  6.8 - 10.7 fL Final    Platelet 07/11/2024 192  150 - 450 10*9/L Final    nRBC 07/11/2024 0  <=4 /100 WBCs Final    Neutrophils % 07/11/2024 71.7  % Final    Lymphocytes % 07/11/2024 13.8  % Final    Monocytes % 07/11/2024 11.8  % Final    Eosinophils % 07/11/2024 1.4  % Final    Basophils % 07/11/2024 1.3  % Final    Absolute Neutrophils 07/11/2024 5.5  1.8 - 7.8 10*9/L Final    Absolute Lymphocytes 07/11/2024 1.0 (L)  1.1 - 3.6 10*9/L Final    Absolute Monocytes 07/11/2024 0.9 (H)  0.3 - 0.8 10*9/L Final    Absolute Eosinophils 07/11/2024 0.1  0.0 - 0.5 10*9/L Final    Absolute Basophils 07/11/2024 0.1  0.0 - 0.1 10*9/L Final   Admission on 07/03/2024, Discharged on 07/04/2024   Component Date Value Ref Range Status    Sodium 07/03/2024 136  135 - 145 mmol/L Final    Potassium 07/03/2024 3.9  3.4 - 4.8 mmol/L Final    Chloride 07/03/2024 92 (L)  98 - 107 mmol/L Final    CO2 07/03/2024 28.9  20.0 - 31.0 mmol/L Final    Anion Gap 07/03/2024 15 (H)  5 - 14 mmol/L Final    BUN 07/03/2024 89 (H)  9 - 23 mg/dL Final    Creatinine 92/92/7974 2.86 (H)  0.55 - 1.02 mg/dL Final    BUN/Creatinine Ratio 07/03/2024 31   Final    eGFR [name]-EPI (2021) Female 07/03/2024 16 (L)  >=60 mL/min/1.69m2 Final    Glucose 07/03/2024 101  70 - 179 mg/dL Final    Calcium  07/03/2024 9.2  8.7 - 10.4 mg/dL Final    Albumin 92/92/7974 2.9 (L)  3.4 - 5.0 g/dL Final    Total Protein 07/03/2024 6.7  5.7 - 8.2 g/dL Final    Total Bilirubin 07/03/2024 0.7  0.3 - 1.2 mg/dL Final    AST 92/92/7974 32  <=34 U/L Final    ALT 07/03/2024 9 (L)  10 - 49 U/L Final    Alkaline Phosphatase 07/03/2024 129 (H)  46 - 116 U/L Final    APTT 07/03/2024 42.4 (H)  24.8 - 38.4 sec Final    Heparin  Correlation 07/03/2024 0.2   Final    PT 07/03/2024 19.1 (H)  9.9 - 12.6 sec Final    INR 07/03/2024 1.68  Final    PRO-BNP 07/03/2024 5,774.0 (H)  <=300.0 pg/mL Final    Specimen Source 07/03/2024 Venous   Final    FIO2 Venous 07/03/2024 3L   Final    pH, Venous 07/03/2024 7.40  7.32 - 7.43 Final    pCO2, Ven 07/03/2024 53  40 - 60 mm Hg Final    pO2, Ven 07/03/2024 38  35 - 40 mm Hg Final    HCO3, Ven 07/03/2024 30 (H)  22 - 27 mmol/L Final    Base Excess, Ven 07/03/2024 7.5 (H)  -2.0 - 2.0 Final    O2 Saturation, Venous 07/03/2024 67.0  40.0 - 85.0 % Final    Sodium Whole Blood 07/03/2024 135  135 - 145 mmol/L Final    Potassium, Bld 07/03/2024 3.6  3.4 - 4.6 mmol/L Final    Calcium , Ionized Venous 07/03/2024 4.54  4.40 - 5.40 mg/dL Final    Glucose Whole Blood 07/03/2024 97  Undefined mg/dL Final    Lactate, Venous 07/03/2024 2.0 (H)  0.5 - 1.8 mmol/L Final    Hgb, blood gas 07/03/2024 10.30 (L)  12.00 - 16.00 g/dL Final    EKG Ventricular Rate 07/03/2024 62  BPM Final    EKG Atrial Rate 07/03/2024 62  BPM Final    EKG P-R Interval 07/03/2024 206  ms Final    EKG QRS Duration 07/03/2024 130  ms Final    EKG Q-T Interval 07/03/2024 640  ms Final    EKG QTC Calculation 07/03/2024 649  ms Final    EKG Calculated P Axis 07/03/2024 75  degrees Final    EKG Calculated R Axis 07/03/2024 -86  degrees Final    EKG Calculated T Axis 07/03/2024 82  degrees Final    QTC Fredericia 07/03/2024 647  ms Final    WBC 07/03/2024 10.2  3.6 - 11.2 10*9/L Final    RBC 07/03/2024 3.27 (L)  3.95 - 5.13 10*12/L Final    HGB 07/03/2024 9.9 (L)  11.3 - 14.9 g/dL Final    HCT 92/92/7974 30.1 (L)  34.0 - 44.0 % Final    MCV 07/03/2024 92.1  77.6 - 95.7 fL Final    MCH 07/03/2024 30.4  25.9 - 32.4 pg Final    MCHC 07/03/2024 33.0  32.0 - 36.0 g/dL Final    RDW 92/92/7974 16.7 (H)  12.2 - 15.2 % Final    MPV 07/03/2024 8.3  6.8 - 10.7 fL Final    Platelet 07/03/2024 178  150 - 450 10*9/L Final    nRBC 07/03/2024 0  <=4 /100 WBCs Final    Neutrophils % 07/03/2024 78.9  % Final    Lymphocytes % 07/03/2024 9.4  % Final    Monocytes % 07/03/2024 11.1  % Final    Eosinophils % 07/03/2024 0.1  % Final    Basophils % 07/03/2024 0.5  % Final    Absolute Neutrophils 07/03/2024 8.1 (H)  1.8 - 7.8 10*9/L Final    Absolute Lymphocytes 07/03/2024 1.0 (L)  1.1 - 3.6 10*9/L Final    Absolute Monocytes 07/03/2024 1.1 (H)  0.3 - 0.8 10*9/L Final    Absolute Eosinophils 07/03/2024 0.0  0.0 - 0.5 10*9/L Final    Absolute Basophils 07/03/2024 0.0  0.0 - 0.1 10*9/L Final    Color, UA 07/03/2024 Light Yellow   Final    Clarity, UA 07/03/2024 Clear   Final    Specific Gravity, UA 07/03/2024 1.012  1.003 - 1.030 Final    pH, UA  07/03/2024 5.0  5.0 - 9.0 Final    Leukocyte Esterase, UA 07/03/2024 Large (A)  Negative Final    Nitrite, UA 07/03/2024 Negative  Negative Final    Protein, UA 07/03/2024 Negative  Negative Final    Glucose, UA 07/03/2024 Negative  Negative Final    Ketones, UA 07/03/2024 Negative  Negative Final    Urobilinogen, UA 07/03/2024 <2.0 mg/dL  <7.9 mg/dL Final    Bilirubin, UA 07/03/2024 Negative  Negative Final    Blood, UA 07/03/2024 Moderate (A)  Negative Final    RBC, UA 07/03/2024 17 (H)  <=4 /HPF Final    WBC, UA 07/03/2024 21 (H)  0 - 5 /HPF Final    Squam Epithel, UA 07/03/2024 5  0 - 5 /HPF Final    Bacteria, UA 07/03/2024 None Seen  None Seen /HPF Final    Hyaline Casts, UA 07/03/2024 23 (H)  0 - 1 /LPF Final    Urine Culture, Comprehensive 07/03/2024 50,000 to 100,000 CFU/mL Enterobacter cloacae complex (A)   Final    Urine Culture, Comprehensive 07/03/2024 50,000 to 100,000 CFU/mL Citrobacter amalonaticus (A)   Final    Magnesium  07/03/2024 2.6  1.6 - 2.6 mg/dL Final    Phosphorus 92/92/7974 4.6  2.4 - 5.1 mg/dL Final    CRP 92/92/7974 20.3 (H)  <=10.0 mg/L Final    Vitamin B-12 07/03/2024 314  211 - 911 pg/ml Final    Folate 07/03/2024 >24.0  >=5.4 ng/mL Final    TSH 07/03/2024 9.066 (H)  0.550 - 4.780 uIU/mL Final    Creatine Kinase, Total 07/03/2024 27.0 (L)  34.0 - 145.0 U/L Final    Free T4 07/03/2024 1.22  0.89 - 1.76 ng/dL Final    Iron  07/04/2024 30 (L)  50 - 170 ug/dL Final    TIBC 92/91/7974 228.1 (L)  250.0 - 425.0 ug/dL Final    Transferrin 92/91/7974 181.0 (L)  250.0 - 380.0 mg/dL Final    Iron  Saturation (%) 07/04/2024 13  % Final    Sodium 07/04/2024 140  135 - 145 mmol/L Final    Potassium 07/04/2024 3.5  3.5 - 5.1 mmol/L Final    Chloride 07/04/2024 94 (L)  98 - 107 mmol/L Final    CO2 07/04/2024 31.8 (H)  20.0 - 31.0 mmol/L Final    Anion Gap 07/04/2024 14  5 - 14 mmol/L Final    BUN 07/04/2024 101 (H)  9 - 23 mg/dL Final    Creatinine 92/91/7974 2.53 (H)  0.55 - 1.02 mg/dL Final    BUN/Creatinine Ratio 07/04/2024 40   Final    eGFR [name]-EPI (2021) Female 07/04/2024 19 (L)  >=60 mL/min/1.18m2 Final    Glucose 07/04/2024 85  70 - 179 mg/dL Final    Calcium  07/04/2024 8.9  8.7 - 10.4 mg/dL Final    WBC 92/91/7974 7.8  3.6 - 11.2 10*9/L Final    RBC 07/04/2024 2.94 (L)  3.95 - 5.13 10*12/L Final    HGB 07/04/2024 9.1 (L)  11.3 - 14.9 g/dL Final    HCT 92/91/7974 27.4 (L)  34.0 - 44.0 % Final    MCV 07/04/2024 93.2  77.6 - 95.7 fL Final    MCH 07/04/2024 31.0  25.9 - 32.4 pg Final    MCHC 07/04/2024 33.3  32.0 - 36.0 g/dL Final    RDW 92/91/7974 16.4 (H)  12.2 - 15.2 % Final    MPV 07/04/2024 8.7  6.8 - 10.7 fL Final    Platelet 07/04/2024 150  150 - 450 10*9/L Final   Office Visit on 06/21/2024   Component Date Value Ref Range Status    EKG Ventricular Rate 06/21/2024 67  BPM Final    EKG Atrial Rate 06/21/2024 67  BPM Final    EKG P-R Interval 06/21/2024 204  ms Final    EKG QRS Duration 06/21/2024 128  ms Final    EKG Q-T Interval 06/21/2024 630  ms Final    EKG QTC Calculation 06/21/2024 665  ms Final    EKG Calculated P Axis 06/21/2024 66  degrees Final    EKG Calculated R Axis 06/21/2024 -58  degrees Final    EKG Calculated T Axis 06/21/2024 56  degrees Final    QTC Fredericia 06/21/2024 654  ms Final    Sodium 06/21/2024 135  135 - 145 mmol/L Final    Potassium 06/21/2024 3.8  3.4 - 4.8 mmol/L Final    Chloride 06/21/2024 99  98 - 107 mmol/L Final    CO2 06/21/2024 27.1  20.0 - 31.0 mmol/L Final    Anion Gap 06/21/2024 9  5 - 14 mmol/L Final BUN 06/21/2024 86 (H)  9 - 23 mg/dL Final    Creatinine 93/74/7974 2.13 (H)  0.55 - 1.02 mg/dL Final    BUN/Creatinine Ratio 06/21/2024 40   Final    eGFR [name]-EPI (2021) Female 06/21/2024 23 (L)  >=60 mL/min/1.26m2 Final    Glucose 06/21/2024 157  70 - 179 mg/dL Final    Calcium  06/21/2024 9.2  8.7 - 10.4 mg/dL Final    PRO-BNP 93/74/7974 9,703.0 (H)  <=300.0 pg/mL Final    WBC 06/21/2024 10.1  3.6 - 11.2 10*9/L Final    RBC 06/21/2024 3.05 (L)  3.95 - 5.13 10*12/L Final    HGB 06/21/2024 9.2 (L)  11.3 - 14.9 g/dL Final    HCT 93/74/7974 28.1 (L)  34.0 - 44.0 % Final    MCV 06/21/2024 92.2  77.6 - 95.7 fL Final    MCH 06/21/2024 30.1  25.9 - 32.4 pg Final    MCHC 06/21/2024 32.7  32.0 - 36.0 g/dL Final    RDW 93/74/7974 14.9  12.2 - 15.2 % Final    MPV 06/21/2024 8.2  6.8 - 10.7 fL Final    Platelet 06/21/2024 251  150 - 450 10*9/L Final    Neutrophils % 06/21/2024 81.2  % Final    Lymphocytes % 06/21/2024 7.7  % Final    Monocytes % 06/21/2024 8.8  % Final    Eosinophils % 06/21/2024 1.6  % Final    Basophils % 06/21/2024 0.7  % Final    Absolute Neutrophils 06/21/2024 8.2 (H)  1.8 - 7.8 10*9/L Final    Absolute Lymphocytes 06/21/2024 0.8 (L)  1.1 - 3.6 10*9/L Final    Absolute Monocytes 06/21/2024 0.9 (H)  0.3 - 0.8 10*9/L Final    Absolute Eosinophils 06/21/2024 0.2  0.0 - 0.5 10*9/L Final    Absolute Basophils 06/21/2024 0.1  0.0 - 0.1 10*9/L Final    Ferritin 06/21/2024 205.4  7.3 - 270.7 ng/mL Final   Admission on 06/12/2024, Discharged on 06/16/2024   Component Date Value Ref Range Status    Blood Culture, Routine 06/12/2024 No Growth at 5 days   Final    Blood Culture, Routine 06/12/2024 No Growth at 5 days   Final    Sodium 06/12/2024 136  135 - 145 mmol/L Final    Potassium 06/12/2024 4.0  3.4 - 4.8 mmol/L Final  Chloride 06/12/2024 96 (L)  98 - 107 mmol/L Final    CO2 06/12/2024 24.0  20.0 - 31.0 mmol/L Final    Anion Gap 06/12/2024 16 (H)  5 - 14 mmol/L Final    BUN 06/12/2024 74 (H)  9 - 23 mg/dL Final    Creatinine 93/83/7974 2.72 (H)  0.55 - 1.02 mg/dL Final    BUN/Creatinine Ratio 06/12/2024 27   Final    eGFR [name]-EPI (2021) Female 06/12/2024 17 (L)  >=60 mL/min/1.48m2 Final    Glucose 06/12/2024 114  70 - 179 mg/dL Final    Calcium  06/12/2024 9.6  8.7 - 10.4 mg/dL Final    Albumin 93/83/7974 3.2 (L)  3.4 - 5.0 g/dL Final    Total Protein 06/12/2024 7.8  5.7 - 8.2 g/dL Final    Total Bilirubin 06/12/2024 1.0  0.3 - 1.2 mg/dL Final    AST 93/83/7974 27  <=34 U/L Final    ALT 06/12/2024 <7 (L)  10 - 49 U/L Final    Alkaline Phosphatase 06/12/2024 127 (H)  46 - 116 U/L Final    Lactate, Venous 06/12/2024 4.7 (HH)  0.5 - 1.8 mmol/L Final    EKG Ventricular Rate 06/12/2024 91  BPM Final    EKG Atrial Rate 06/12/2024 96  BPM Final    EKG QRS Duration 06/12/2024 126  ms Final    EKG Q-T Interval 06/12/2024 394  ms Final    EKG QTC Calculation 06/12/2024 484  ms Final    EKG Calculated R Axis 06/12/2024 -76  degrees Final    EKG Calculated T Axis 06/12/2024 78  degrees Final    QTC Fredericia 06/12/2024 453  ms Final    WBC 06/12/2024 18.9 (H)  3.6 - 11.2 10*9/L Final    RBC 06/12/2024 3.88 (L)  3.95 - 5.13 10*12/L Final    HGB 06/12/2024 11.6  11.3 - 14.9 g/dL Final    HCT 93/83/7974 35.8  34.0 - 44.0 % Final    MCV 06/12/2024 92.2  77.6 - 95.7 fL Final    MCH 06/12/2024 29.8  25.9 - 32.4 pg Final    MCHC 06/12/2024 32.3  32.0 - 36.0 g/dL Final    RDW 93/83/7974 14.7  12.2 - 15.2 % Final    MPV 06/12/2024 8.8  6.8 - 10.7 fL Final    Platelet 06/12/2024 229  150 - 450 10*9/L Final    Neutrophils % 06/12/2024 85.7  % Final    Lymphocytes % 06/12/2024 4.1  % Final    Monocytes % 06/12/2024 9.9  % Final    Eosinophils % 06/12/2024 0.0  % Final    Basophils % 06/12/2024 0.3  % Final    Absolute Neutrophils 06/12/2024 16.2 (H)  1.8 - 7.8 10*9/L Final    Absolute Lymphocytes 06/12/2024 0.8 (L)  1.1 - 3.6 10*9/L Final    Absolute Monocytes 06/12/2024 1.9 (H)  0.3 - 0.8 10*9/L Final    Absolute Eosinophils 06/12/2024 0.0 0.0 - 0.5 10*9/L Final    Absolute Basophils 06/12/2024 0.1  0.0 - 0.1 10*9/L Final    Color, UA 06/13/2024 Light Yellow   Final    Clarity, UA 06/13/2024 Clear   Final    Specific Gravity, UA 06/13/2024 1.012  1.003 - 1.030 Final    pH, UA 06/13/2024 6.0  5.0 - 9.0 Final    Leukocyte Esterase, UA 06/13/2024 Negative  Negative Final    Nitrite, UA 06/13/2024 Negative  Negative Final    Protein, UA  06/13/2024 Negative  Negative Final    Glucose, UA 06/13/2024 Negative  Negative Final    Ketones, UA 06/13/2024 Negative  Negative Final    Urobilinogen, UA 06/13/2024 <2.0 mg/dL  <7.9 mg/dL Final    Bilirubin, UA 06/13/2024 Negative  Negative Final    Blood, UA 06/13/2024 Moderate (A)  Negative Final    RBC, UA 06/13/2024 24 (H)  <=4 /HPF Final    WBC, UA 06/13/2024 3  0 - 5 /HPF Final    Squam Epithel, UA 06/13/2024 <1  0 - 5 /HPF Final    Bacteria, UA 06/13/2024 None Seen  None Seen /HPF Final    Hyaline Casts, UA 06/13/2024 4 (H)  0 - 1 /LPF Final    Mucus, UA 06/13/2024 Rare (A)  None Seen /HPF Final    Adenovirus 06/12/2024 Not Detected  Not Detected Final    Coronavirus HKU1 06/12/2024 Not Detected  Not Detected Final    Coronavirus NL63 06/12/2024 Not Detected  Not Detected Final    Coronavirus 229E 06/12/2024 Not Detected  Not Detected Final    Coronavirus OC43 PCR 06/12/2024 Not Detected  Not Detected Final    Metapneumovirus 06/12/2024 Not Detected  Not Detected Final    Rhinovirus/Enterovirus 06/12/2024 Not Detected  Not Detected Final    Influenza A 06/12/2024 Not Detected  Not Detected Final    Influenza B 06/12/2024 Not Detected  Not Detected Final    Parainfluenza 1 06/12/2024 Not Detected  Not Detected Final    Parainfluenza 2 06/12/2024 Not Detected  Not Detected Final    Parainfluenza 3 06/12/2024 Not Detected  Not Detected Final    Parainfluenza 4 06/12/2024 Not Detected  Not Detected Final    RSV 06/12/2024 Not Detected  Not Detected Final    Bordetella pertussis 06/12/2024 Not Detected  Not Detected Final    Bordetella parapertussis 06/12/2024 Not Detected  Not Detected Final    Chlamydophila (Chlamydia) pneumoni* 06/12/2024 Not Detected  Not Detected Final    Mycoplasma pneumoniae 06/12/2024 Not Detected  Not Detected Final    SARS-CoV-2 PCR 06/12/2024 Not Detected  Not Detected Final    Specimen Source 06/12/2024 Venous   Final    FIO2 Venous 06/12/2024 Not Specified   Final    pH, Venous 06/12/2024 7.36  7.32 - 7.43 Final    pCO2, Ven 06/12/2024 44  40 - 60 mm Hg Final    pO2, Ven 06/12/2024 26 (L)  30 - 55 mm Hg Final    HCO3, Ven 06/12/2024 24  22 - 27 mmol/L Final    Base Excess, Ven 06/12/2024 -0.4  -2.0 - 2.0 Final    O2 Saturation, Venous 06/12/2024 41.3  40.0 - 85.0 % Final    Lactate, Venous 06/12/2024 2.7 (HH)  0.5 - 1.8 mmol/L Final    Lactate, Venous 06/12/2024 1.4  0.5 - 1.8 mmol/L Final    Sodium 06/13/2024 139  135 - 145 mmol/L Final    Potassium 06/13/2024 3.4  3.4 - 4.8 mmol/L Final    Chloride 06/13/2024 96 (L)  98 - 107 mmol/L Final    CO2 06/13/2024 27.3  20.0 - 31.0 mmol/L Final    Anion Gap 06/13/2024 16 (H)  5 - 14 mmol/L Final    BUN 06/13/2024 75 (H)  9 - 23 mg/dL Final    Creatinine 93/82/7974 2.63 (H)  0.55 - 1.02 mg/dL Final    BUN/Creatinine Ratio 06/13/2024 29   Final    eGFR [name]-EPI (2021) Female 06/13/2024  18 (L)  >=60 mL/min/1.40m2 Final    Glucose 06/13/2024 78  70 - 179 mg/dL Final    Calcium  06/13/2024 8.9  8.7 - 10.4 mg/dL Final    Albumin 93/82/7974 2.5 (L)  3.4 - 5.0 g/dL Final    Total Protein 06/13/2024 6.3  5.7 - 8.2 g/dL Final    Total Bilirubin 06/13/2024 0.7  0.3 - 1.2 mg/dL Final    AST 93/82/7974 22  <=34 U/L Final    ALT 06/13/2024 <7 (L)  10 - 49 U/L Final    Alkaline Phosphatase 06/13/2024 100  46 - 116 U/L Final    Magnesium  06/13/2024 2.3  1.6 - 2.6 mg/dL Final    Phosphorus 93/82/7974 4.8  2.4 - 5.1 mg/dL Final    WBC 93/82/7974 23.1 (H)  3.6 - 11.2 10*9/L Final    RBC 06/13/2024 3.42 (L)  3.95 - 5.13 10*12/L Final    HGB 06/13/2024 10.3 (L)  11.3 - 14.9 g/dL Final    HCT 93/82/7974 31.3 (L)  34.0 - 44.0 % Final    MCV 06/13/2024 91.3  77.6 - 95.7 fL Final    MCH 06/13/2024 30.2  25.9 - 32.4 pg Final    MCHC 06/13/2024 33.0  32.0 - 36.0 g/dL Final    RDW 93/82/7974 14.8  12.2 - 15.2 % Final    MPV 06/13/2024 9.0  6.8 - 10.7 fL Final    Platelet 06/13/2024 149 (L)  150 - 450 10*9/L Final    PRO-BNP 06/13/2024 15,236.0 (H)  <=300.0 pg/mL Final    Sodium 06/14/2024 136  135 - 145 mmol/L Final    Potassium 06/14/2024 3.4  3.4 - 4.8 mmol/L Final    Chloride 06/14/2024 95 (L)  98 - 107 mmol/L Final    CO2 06/14/2024 23.6  20.0 - 31.0 mmol/L Final    Anion Gap 06/14/2024 17 (H)  5 - 14 mmol/L Final    BUN 06/14/2024 68 (H)  9 - 23 mg/dL Final    Creatinine 93/81/7974 2.50 (H)  0.55 - 1.02 mg/dL Final    BUN/Creatinine Ratio 06/14/2024 27   Final    eGFR [name]-EPI (2021) Female 06/14/2024 19 (L)  >=60 mL/min/1.69m2 Final    Glucose 06/14/2024 104  70 - 179 mg/dL Final    Calcium  06/14/2024 9.3  8.7 - 10.4 mg/dL Final    Magnesium  06/14/2024 2.3  1.6 - 2.6 mg/dL Final    CRP 93/81/7974 152.1 (H)  <=10.0 mg/L Final    Cystatin C 06/14/2024 3.60 (H)  0.64 - 1.23 mg/L Final    eGFR [name]-EPI (2012) Cystatin C Fem* 06/14/2024 12 (L)  >=60 mL/min/1.62m2 Final    WBC 06/15/2024 17.6 (H)  3.6 - 11.2 10*9/L Final    RBC 06/15/2024 3.15 (L)  3.95 - 5.13 10*12/L Final    HGB 06/15/2024 9.7 (L)  11.3 - 14.9 g/dL Final    HCT 93/80/7974 29.0 (L)  34.0 - 44.0 % Final    MCV 06/15/2024 91.9  77.6 - 95.7 fL Final    MCH 06/15/2024 30.6  25.9 - 32.4 pg Final    MCHC 06/15/2024 33.3  32.0 - 36.0 g/dL Final    RDW 93/80/7974 14.5  12.2 - 15.2 % Final    MPV 06/15/2024 8.6  6.8 - 10.7 fL Final    Platelet 06/15/2024 158  150 - 450 10*9/L Final    Sodium 06/15/2024 137  135 - 145 mmol/L Final    Potassium 06/15/2024 3.4  3.4 -  4.8 mmol/L Final    Chloride 06/15/2024 97 (L)  98 - 107 mmol/L Final    CO2 06/15/2024 25.3  20.0 - 31.0 mmol/L Final    Anion Gap 06/15/2024 15 (H)  5 - 14 mmol/L Final BUN 06/15/2024 71 (H)  9 - 23 mg/dL Final    Creatinine 93/80/7974 2.46 (H)  0.55 - 1.02 mg/dL Final    BUN/Creatinine Ratio 06/15/2024 29   Final    eGFR [name]-EPI (2021) Female 06/15/2024 19 (L)  >=60 mL/min/1.38m2 Final    Glucose 06/15/2024 117  70 - 179 mg/dL Final    Calcium  06/15/2024 9.2  8.7 - 10.4 mg/dL Final    Magnesium  06/15/2024 2.3  1.6 - 2.6 mg/dL Final    EKG Ventricular Rate 06/15/2024 72  BPM Final    EKG Atrial Rate 06/15/2024 72  BPM Final    EKG P-R Interval 06/15/2024 218  ms Final    EKG QRS Duration 06/15/2024 126  ms Final    EKG Q-T Interval 06/15/2024 404  ms Final    EKG QTC Calculation 06/15/2024 442  ms Final    EKG Calculated P Axis 06/15/2024 69  degrees Final    EKG Calculated R Axis 06/15/2024 259  degrees Final    EKG Calculated T Axis 06/15/2024 102  degrees Final    QTC Fredericia 06/15/2024 429  ms Final    WBC 06/16/2024 15.7 (H)  3.6 - 11.2 10*9/L Final    RBC 06/16/2024 3.29 (L)  3.95 - 5.13 10*12/L Final    HGB 06/16/2024 10.0 (L)  11.3 - 14.9 g/dL Final    HCT 93/79/7974 30.4 (L)  34.0 - 44.0 % Final    MCV 06/16/2024 92.3  77.6 - 95.7 fL Final    MCH 06/16/2024 30.4  25.9 - 32.4 pg Final    MCHC 06/16/2024 32.9  32.0 - 36.0 g/dL Final    RDW 93/79/7974 14.8  12.2 - 15.2 % Final    MPV 06/16/2024 9.1  6.8 - 10.7 fL Final    Platelet 06/16/2024 186  150 - 450 10*9/L Final    Sodium 06/16/2024 138  135 - 145 mmol/L Final    Potassium 06/16/2024 3.8  3.4 - 4.8 mmol/L Final    Chloride 06/16/2024 96 (L)  98 - 107 mmol/L Final    CO2 06/16/2024 24.7  20.0 - 31.0 mmol/L Final    Anion Gap 06/16/2024 17 (H)  5 - 14 mmol/L Final    BUN 06/16/2024 72 (H)  9 - 23 mg/dL Final    Creatinine 93/79/7974 2.42 (H)  0.55 - 1.02 mg/dL Final    BUN/Creatinine Ratio 06/16/2024 30   Final    eGFR [name]-EPI (2021) Female 06/16/2024 20 (L)  >=60 mL/min/1.17m2 Final    Glucose 06/16/2024 102  70 - 179 mg/dL Final    Calcium  06/16/2024 9.5  8.7 - 10.4 mg/dL Final    Magnesium  06/16/2024 2.3  1.6 - 2.6 mg/dL Final   Admission on 93/84/7974, Discharged on 06/12/2024   Component Date Value Ref Range Status    Sodium 06/11/2024 134 (L)  135 - 145 mmol/L Final    Potassium 06/11/2024 4.0  3.4 - 4.8 mmol/L Final    Chloride 06/11/2024 97 (L)  98 - 107 mmol/L Final    CO2 06/11/2024 25.0  20.0 - 31.0 mmol/L Final    Anion Gap 06/11/2024 12  5 - 14 mmol/L Final    BUN 06/11/2024 71 (H)  9 - 23  mg/dL Final    Creatinine 93/84/7974 2.60 (H)  0.55 - 1.02 mg/dL Final    BUN/Creatinine Ratio 06/11/2024 27   Final    eGFR [name]-EPI (2021) Female 06/11/2024 18 (L)  >=60 mL/min/1.45m2 Final    Glucose 06/11/2024 187 (H)  70 - 179 mg/dL Final    Calcium  06/11/2024 9.7  8.7 - 10.4 mg/dL Final    Albumin 93/84/7974 3.3 (L)  3.4 - 5.0 g/dL Final    Total Protein 06/11/2024 8.0  5.7 - 8.2 g/dL Final    Total Bilirubin 06/11/2024 0.5  0.3 - 1.2 mg/dL Final    AST 93/84/7974 26  <=34 U/L Final    ALT 06/11/2024 8 (L)  10 - 49 U/L Final    Alkaline Phosphatase 06/11/2024 132 (H)  46 - 116 U/L Final    PRO-BNP 06/11/2024 7,546.0 (H)  <=300.0 pg/mL Final    hsTroponin I 06/11/2024 133 (HH)  <=34 ng/L Final    EKG Ventricular Rate 06/11/2024 89  BPM Corrected    EKG Atrial Rate 06/11/2024 83  BPM Corrected    EKG QRS Duration 06/11/2024 124  ms Corrected    EKG Q-T Interval 06/11/2024 382  ms Corrected    EKG QTC Calculation 06/11/2024 464  ms Corrected    EKG Calculated R Axis 06/11/2024 -70  degrees Corrected    EKG Calculated T Axis 06/11/2024 83  degrees Corrected    QTC Fredericia 06/11/2024 435  ms Corrected    EKG Ventricular Rate 06/11/2024 86  BPM Final    EKG Atrial Rate 06/11/2024 85  BPM Final    EKG QRS Duration 06/11/2024 122  ms Final    EKG Q-T Interval 06/11/2024 418  ms Final    EKG QTC Calculation 06/11/2024 500  ms Final    EKG Calculated R Axis 06/11/2024 -79  degrees Final    EKG Calculated T Axis 06/11/2024 49  degrees Final    QTC Fredericia 06/11/2024 471  ms Final    WBC 06/11/2024 12.3 (H)  3.6 - 11.2 10*9/L Final RBC 06/11/2024 4.00  3.95 - 5.13 10*12/L Final    HGB 06/11/2024 12.1  11.3 - 14.9 g/dL Final    HCT 93/84/7974 37.3  34.0 - 44.0 % Final    MCV 06/11/2024 93.4  77.6 - 95.7 fL Final    MCH 06/11/2024 30.2  25.9 - 32.4 pg Final    MCHC 06/11/2024 32.4  32.0 - 36.0 g/dL Final    RDW 93/84/7974 14.9  12.2 - 15.2 % Final    MPV 06/11/2024 8.7  6.8 - 10.7 fL Final    Platelet 06/11/2024 242  150 - 450 10*9/L Final    Neutrophils % 06/11/2024 81.1  % Final    Lymphocytes % 06/11/2024 7.4  % Final    Monocytes % 06/11/2024 9.4  % Final    Eosinophils % 06/11/2024 1.5  % Final    Basophils % 06/11/2024 0.6  % Final    Absolute Neutrophils 06/11/2024 9.9 (H)  1.8 - 7.8 10*9/L Final    Absolute Lymphocytes 06/11/2024 0.9 (L)  1.1 - 3.6 10*9/L Final    Absolute Monocytes 06/11/2024 1.2 (H)  0.3 - 0.8 10*9/L Final    Absolute Eosinophils 06/11/2024 0.2  0.0 - 0.5 10*9/L Final    Absolute Basophils 06/11/2024 0.1  0.0 - 0.1 10*9/L Final    hsTroponin I 06/11/2024 126 (HH)  <=34 ng/L Final    delta hsTroponin I 06/11/2024 7  <=7 ng/L  Final    Magnesium  06/11/2024 2.4  1.6 - 2.6 mg/dL Final    EKG Ventricular Rate 07/29/2024 91  BPM Preliminary    EKG Atrial Rate 07/29/2024 277  BPM Preliminary    EKG QRS Duration 07/29/2024 110  ms Preliminary    EKG Q-T Interval 07/29/2024 384  ms Preliminary    EKG QTC Calculation 07/29/2024 472  ms Preliminary    EKG Calculated R Axis 07/29/2024 182  degrees Preliminary    EKG Calculated T Axis 07/29/2024 23  degrees Preliminary    QTC Fredericia 07/29/2024 441  ms Preliminary   Office Visit on 06/08/2024   Component Date Value Ref Range Status    Sodium 06/08/2024 137  135 - 145 mmol/L Final    Potassium 06/08/2024 5.4 (H)  3.4 - 4.8 mmol/L Final    Chloride 06/08/2024 100  98 - 107 mmol/L Final    CO2 06/08/2024 25.9  20.0 - 31.0 mmol/L Final    Anion Gap 06/08/2024 11  5 - 14 mmol/L Final    BUN 06/08/2024 68 (H)  9 - 23 mg/dL Final    Creatinine 93/87/7974 2.23 (H)  0.55 - 1.02 mg/dL Final    BUN/Creatinine Ratio 06/08/2024 30   Final    eGFR [name]-EPI (2021) Female 06/08/2024 22 (L)  >=60 mL/min/1.38m2 Final    Glucose 06/08/2024 149  70 - 179 mg/dL Final    Calcium  06/08/2024 9.5  8.7 - 10.4 mg/dL Final    TSH 93/87/7974 16.943 (H)  0.550 - 4.780 uIU/mL Final    Free T4 06/08/2024 1.27  0.89 - 1.76 ng/dL Final    PRO-BNP 93/87/7974 10,111.0 (H)  <=300.0 pg/mL Final   Office Visit on 04/26/2024   Component Date Value Ref Range Status    Sodium 04/26/2024 137  135 - 145 mmol/L Final    Potassium 04/26/2024 4.6  3.4 - 4.8 mmol/L Final    Chloride 04/26/2024 98  98 - 107 mmol/L Final    CO2 04/26/2024 26.3  20.0 - 31.0 mmol/L Final    Anion Gap 04/26/2024 13  5 - 14 mmol/L Final    BUN 04/26/2024 55 (H)  9 - 23 mg/dL Final    Creatinine 95/69/7974 2.58 (H)  0.55 - 1.02 mg/dL Final    BUN/Creatinine Ratio 04/26/2024 21   Final    eGFR [name]-EPI (2021) Female 04/26/2024 18 (L)  >=60 mL/min/1.30m2 Final    Glucose 04/26/2024 117  70 - 179 mg/dL Final    Calcium  04/26/2024 9.3  8.7 - 10.4 mg/dL Final    Albumin 95/69/7974 3.0 (L)  3.4 - 5.0 g/dL Final    Total Protein 04/26/2024 7.3  5.7 - 8.2 g/dL Final    Total Bilirubin 04/26/2024 0.4  0.3 - 1.2 mg/dL Final    AST 95/69/7974 22  <=34 U/L Final    ALT 04/26/2024 7 (L)  10 - 49 U/L Final    Alkaline Phosphatase 04/26/2024 112  46 - 116 U/L Final    PRO-BNP 04/26/2024 8,183.0 (H)  <=300.0 pg/mL Final    Magnesium  04/26/2024 2.6  1.6 - 2.6 mg/dL Final   Appointment on 04/17/2024   Component Date Value Ref Range Status    TSH 04/17/2024 0.155 (L)  0.550 - 4.780 uIU/mL Final    Free T4 04/17/2024 1.79 (H)  0.89 - 1.76 ng/dL Final    T3, Free 95/78/7974 2.18 (L)  2.30 - 4.20 pg/mL Final    WBC 04/17/2024 7.2  3.6 - 11.2  10*9/L Final    RBC 04/17/2024 3.98  3.95 - 5.13 10*12/L Final    HGB 04/17/2024 11.9  11.3 - 14.9 g/dL Final    HCT 95/78/7974 36.2  34.0 - 44.0 % Final    MCV 04/17/2024 91.0  77.6 - 95.7 fL Final    MCH 04/17/2024 29.8  25.9 - 32.4 pg Final MCHC 04/17/2024 32.8  32.0 - 36.0 g/dL Final    RDW 95/78/7974 14.2  12.2 - 15.2 % Final    MPV 04/17/2024 8.1  6.8 - 10.7 fL Final    Platelet 04/17/2024 217  150 - 450 10*9/L Final    nRBC 04/17/2024 0  <=4 /100 WBCs Final    Neutrophils % 04/17/2024 71.6  % Final    Lymphocytes % 04/17/2024 14.9  % Final    Monocytes % 04/17/2024 11.0  % Final    Eosinophils % 04/17/2024 1.4  % Final    Basophils % 04/17/2024 1.1  % Final    Absolute Neutrophils 04/17/2024 5.2  1.8 - 7.8 10*9/L Final    Absolute Lymphocytes 04/17/2024 1.1  1.1 - 3.6 10*9/L Final    Absolute Monocytes 04/17/2024 0.8  0.3 - 0.8 10*9/L Final    Absolute Eosinophils 04/17/2024 0.1  0.0 - 0.5 10*9/L Final    Absolute Basophils 04/17/2024 0.1  0.0 - 0.1 10*9/L Final   There may be more visits with results that are not included.       Lab Results   Component Value Date    PRO-BNP 6,303.0 (H) 07/29/2024    PRO-BNP 4,867.0 (H) 07/18/2024    PRO-BNP 5,774.0 (H) 07/03/2024    Creatinine 2.06 (H) 07/31/2024    Creatinine 2.16 (H) 07/30/2024    Creatinine 0.79 01/06/2011    BUN 40 (H) 07/31/2024    BUN 45 (H) 07/30/2024    BUN 20 01/06/2011    Sodium 142 07/31/2024    Sodium 140 01/06/2011    Potassium 4.0 07/31/2024    Potassium 4.1 01/06/2011    CO2 27.7 07/31/2024    CO2 30 01/06/2011    Magnesium  2.3 07/31/2024    Magnesium  2.0 01/06/2011    Total Bilirubin 0.4 07/30/2024    INR 1.68 07/03/2024    INR 1.0 01/06/2011       Lab Results   Component Value Date    Digoxin Level 0.9 10/01/2020       Lab Results   Component Value Date    TSH 4.182 07/29/2024    Cholesterol, Total 95 12/06/2021    Triglycerides 66 12/06/2021    Cholesterol, HDL 42 12/06/2021    Cholesterol, Non-HDL, Calculated 53 (L) 12/06/2021    Cholesterol, LDL, Calculated 40 12/06/2021       Lab Results   Component Value Date    WBC 9.3 07/31/2024    WBC 12.5 (H) 01/06/2011    HGB 10.4 (L) 07/31/2024    HGB 12.9 01/06/2011    HCT 31.0 (L) 07/31/2024    HCT 40.8 01/06/2011    Platelet 181 07/31/2024    Platelet 260 01/06/2011       Pertinent Test Results from Today:  None    Other pertinent records were reviewed.    The following are further history from the patient's EPIC record for reference:     Past Medical History:   Diagnosis Date    Acute exacerbation of CHF (congestive heart failure)    08/19/2023    Acute kidney injury superimposed on chronic kidney disease 10/11/2020  Acute on chronic diastolic (congestive) heart failure    08/23/2020    Acute on chronic diastolic congestive heart failure    08/23/2020    AKI (acute kidney injury) 04/14/2015    Lab Results  Component  Value  Date     CREATININE  1.90 (H)  06/12/2021     Had a bump in her creatinine when she was taking her diuretics every day.  She is currently taking 40 mg daily of torsemide  and 50 mg of spironolactone .  Her volume status is fragile.  Previously when she stopped her diuretic she becomes short of breath.  Plan: We will check her BMP today.  We will likely have to go to 40    Anemia 08/22/2019    Iron  deficiency anemia          Lab Results      Component    Value    Date           WBC    9.2    03/21/2021           RBC    3.67 (L)    03/21/2021           HGB    9.9 (L)    03/21/2021           HCT    30.7 (L)    03/21/2021           MCV    83.7    03/21/2021           MCH    26.9    03/21/2021           MCHC    32.1    03/21/2021           RDW    21.9 (H)    03/21/2021           PLT    318     Arthritis     Calculus of kidney     Calculus of ureter     CHF (congestive heart failure)        Chronic atrial fibrillation    07/20/2019    COPD (chronic obstructive pulmonary disease)        Diabetes        Gangrenous cholecystitis 10/11/2020    Generalized edema  06/17/2021    GERD (gastroesophageal reflux disease)     Hydronephrosis     Hypertension     Hyponatremia 10/11/2020    Intermediate coronary syndrome    03/13/2014    Lower extremity edema 09/28/2020    Lumbar stenosis     Microscopic hematuria     Nausea alone Nephrolithiasis 04/17/2016    Neuropathy     Nocturia     Nocturia 07/01/2017    Other chronic cystitis     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  See plans for anemia, AKI  We are also tapering her gabapentin .  Currently on 300 mg 3 times daily.  We will decrease it to twice daily, and then nightly.  She states she is not having any recurrence of her pain.    Pulmonary hypertension        Renal colic     Shortness of breath 04/28/2020    Sleep apnea     Unstable angina pectoris    03/13/2014       Past Surgical History:   Procedure Laterality Date    BACK SURGERY  1995    CARPAL TUNNEL RELEASE Left 2014    HYSTERECTOMY  1971    IR INSERT CHOLECYSTOSTOMY TUBE PERCUTANEOUS  10/02/2020    IR INSERT CHOLECYSTOSMY TUBE PERCUTANEOUS 10/02/2020 Ivonne Butler Seed, MD IMG VIR H&V [facility]    LUMBAR DISC SURGERY      PR REMOVAL GALLBLADDER N/A 10/06/2020    Procedure: CHOLECYSTECTOMY;  Surgeon: Curtistine Mira Dunnings, MD;  Location: MAIN OR [facility];  Service: Trauma    PR RIGHT HEART CATH O2 SATURATION & CARDIAC OUTPUT N/A 09/30/2020    Procedure: Right Heart Catheterization;  Surgeon: Reyes Jerilynn Sage, MD;  Location: [facility] CATH;  Service: Cardiology    PR RIGHT HEART CATH O2 SATURATION & CARDIAC OUTPUT N/A 01/09/2021    Procedure: Right Heart Catheterization;  Surgeon: Norleen Deward Grumbling, MD;  Location: [facility] CATH;  Service: Cardiology

## 2024-08-02 NOTE — Unmapped (Signed)
 [place] [facility] at [facility]     Reason for visit: Follow up    Questions / Concerns that need to be addressed:     Screening BP 121/63 P 73    Omron BPs (complete if screening BP has a systolic  > 130 or diastolic > 80)  BP#1    BP#2   BP#3     Average BP   (please note this as a comment in vitals)     PTHomeBP     Diabetes:  Regularly checking blood sugars?: no  If yes, when? Complete log for past 7 days  Date Before Breakfast After Breakfast Before Lunch After Lunch Before Dinner After Dinner Before Bed    08/6'25  110                                                                                                                               Dexcom or Libre CGM in use? If so, pull appropriate reporting through portal (Dexcom) or EPIC order [place]).    HCDM reviewed and updated in Epic:    We are working to make sure all of our patients??? wishes are updated in Epic and part of that is documenting a Environmental health practitioner for each patient  A Health Care Decision Maker is someone you choose who can make health care decisions for you if you are not able - who would you most want to do this for you????  was updated.    HCDM (With Legal Document To Support): Barbara Huber - Daughter - [phone]    BPAs completed:      Annual Screenings:   Tobacco and Falls - 65+  __________________________________________________________________________________________    SCREENINGS COMPLETED IN FLOWSHEETS      AUDIT       PHQ2       PHQ9          GAD7       COPD Assessment       Falls Risk  Falls Risk  Have you fallen in the past year?: No  Do you feel unsteady when standing or walking?: (!) Yes

## 2024-08-02 NOTE — Unmapped (Signed)
 Internal Medicine Clinic Visit    Reason for visit: Hospital follow up    A/P:     Assessment & Plan  Chronic heart failure, recent exacerbation  Recent CHF exacerbation with stable weight post-discharge. Breathing reported as good. Diuretic regimen adjusted by Damien.  - Continue current increased torsemide  regimen as per Emily's instructions.  - Check kidney function in two weeks at [facility]    Chronic obstructive pulmonary disease, oxygen  dependent  Oxygen -dependent COPD with persistent cough. Chest X-ray showed pulmonary edema, small effusions, and bivasilar hazy opacities.  Plan: cont oxygen     Chronic kidney disease  [name] with recent iron  infusions. Kidney function to be checked due to increased diuretic dosage.  - Check kidney function in two weeks at [facility].    Chronic cough  Persistent cough not improved with hospitalization.  Differential diagnosis: CHF versus infection.  Did trial Doxy previously.  But has not taken her Augmentin  that she has at home.  Plan: - Start Augmentin .  - Use over-the-counter cough syrup as needed.  Consider levofloxacin  in the future    Vaginal candidiasis  Vaginal itching and redness likely due to candidiasis, exacerbated by antibiotic use. Symptoms mostly external with some internal involvement.  - Prescribe fluconazole , one tablet.  - Use nystatin  powder externally.  - Consider over-the-counter clotrimazole cream for external use.    Chronic left leg edema  Chronic left leg edema, with the left leg more swollen than the right.  Plan noted is chronic, will continue to follow weights.      Health Maintenance  Health Maintenance Due   Topic Date Due    COVID-19 Vaccine (5 - 2024-25 season) 08/29/2023        Return for Next scheduled follow up.  Next Visit:   Assess fluid status  Orders Placed This Encounter   Procedures    Basic metabolic panel      HPI:  History of Present Illness  Barbara Huber is an 83 year old female with COPD, CHF, amyloid, and [name] who presents for hospital follow-up after a recent CHF exacerbation.    She was hospitalized for a CHF exacerbation and discharged on July 31, 2024. During her hospitalization, she received extra doses of Lasix , which provided relief. Post-discharge, her torsemide  dosage was increased to 60 mg in the morning and 60 mg in the afternoon by her caregiver. Her weight is stable at 169 pounds, with no current symptoms of fluid overload.    She experiences a persistent cough that occurs at any time and is sometimes productive of clear sputum. The cough did not improve during her hospital stay, and inhalers have not been effective. She has not taken any antibiotics recently, although she has Augmentin  at home. Her last chest x-ray on July 29, 2024, showed pulmonary edema with small effusions and bivasilar hazy opacities, which may represent atelectasis or pneumonitis. She is oxygen -dependent due to COPD, but her breathing is currently good.    She is experiencing vaginal itching, described as redness around the vaginal area, likely due to heightened moisture. She has been using external creams for relief.    Her left leg is more swollen than the right, which is a chronic issue for her.    She received iron  infusions during her hospital stay on Saturday, Sunday, and Monday, which were administered alongside IV Lasix .    PTHomeBP   Problem List:  Problem List[1]    Medications:  Reviewed in EPIC  Soc History and Family History reviewed  in Epic    Physical Exam:   Vital Signs:  Vitals:    08/02/24 1515   BP: 121/63   BP Site: L Arm   BP Position: Sitting   BP Cuff Size: Medium   Pulse: 73   Resp: 18   Temp: 35.8 ??C (96.4 ??F)   TempSrc: Temporal   SpO2: 99%   Weight: 76.7 kg (169 lb)   Height: 167.6 cm (5' 6)     Wt Readings from Last 3 Encounters:   08/02/24 76.7 kg (169 lb)   08/02/24 76.2 kg (168 lb)   07/31/24 76.6 kg (168 lb 14.4 oz)      Physical Exam  MEASUREMENTS: Weight- 76.7.  CARDIOVASCULAR: Heart sounds normal.  EXTREMITIES: Left leg with mild edema.    Records review  Results  RADIOLOGY  Chest X-ray: Pulmonary edema with small pleural effusions, bibasilar hazy opacities may represent atelectasis or pneumonitis (07/29/2024)    Lab Results   Component Value Date    CREATININE 2.06 (H) 07/31/2024    CHOL 95 12/06/2021    HDL 42 12/06/2021    LDL 40 12/06/2021    NONHDL 53 (L) 12/06/2021    TRIG 66 12/06/2021    A1C 4.8 03/03/2024         The ASCVD Risk score (Arnett DK, et al., 2019) failed to calculate.     Medication adherence and barriers to the treatment plan have been addressed. Opportunities to optimize healthy behaviors have been discussed. Patient / caregiver voiced understanding.    I personally spent 20 minutes face-to-face and non-face-to-face in the care of this patient, which includes all pre, intra, and post visit time on the date of service.               [1]   Patient Active Problem List  Diagnosis    Spinal stenosis of lumbar region    Thoracic or lumbosacral neuritis or radiculitis    Lumbosacral spondylosis    COPD (chronic obstructive pulmonary disease)       Sleep apnea in adult    Essential hypertension    Type 2 diabetes mellitus with stage 4 chronic kidney disease, with long-term current use of insulin        Chronic cystitis    Incomplete bladder emptying    Peripheral neuropathy    Chronic respiratory failure with hypoxia       Atrial fibrillation       Anemia in chronic kidney disease    Chronic kidney disease ([name]), stage IV (severe)       Pulmonary hypertension       Chronic prescription opiate use    Bronchitis    Chronic heart failure with preserved ejection fraction       Cardiac amyloidosis       Recurrent cold sores    Dyspepsia    Iron  deficiency anemia due to chronic blood loss    Neuropathy    Left hip pain    Familial amyloid heart disease       Hereditary amyloidosis       Osteoporosis with current pathological fracture    Acute exacerbation of CHF (congestive heart failure)       Troponin level elevated Thrombocytopenia    Difficulty walking    Pneumonia due to infectious organism    Dyspnea    Acute on chronic hypoxic respiratory failure       Leukocytosis

## 2024-08-02 NOTE — Unmapped (Signed)
 Addended by: Shaman Muscarella on: 08/02/2024 03:19 PM     Modules accepted: Level of Service

## 2024-08-02 NOTE — Unmapped (Signed)
 Today,    MEDICATIONS:  We are changing your medications today.  - Change torsemide  to 60mg  twice a day (from 80mg  once a day). If this doesn't seem to work as well, let me know, and we will tweak it.   Call if you have questions about your medications.    LABS:  None today    NEXT APPOINTMENT:  Return to clinic in 1 months with me      In general, to take care of your heart failure:  -Limit your fluid intake to 2 Liters (half-gallon) per day.    -Limit your salt intake to ideally 2-3 grams (2000-3000 mg) per day.  -Weigh yourself daily and record, and bring that weight diary to your next appointment.  (Weight gain of 2-3 pounds in 1 day typically means fluid weight.)    The medications for your heart are to help your heart and help you live longer.    Please contact us  before stopping any of your heart medications.    Call the clinic at [phone] with questions.  Our clinic fax number is [phone].  If you need to reschedule future appointments, please call [phone] or [phone]  My office number (c/o Elenor Mulch RN) is [phone] if you need further assistance.    Please do not send a MyChart message for potentially life-threatening symptoms.  Please call 911 for a true medical emergency.    To learn more about heart failure, please read [facility] Learning to Live with Heart Failure - Available online at:  [email or web address] - open the window for Medical Management and click the link Living with Heart Failure   (Can search [facility] heart failure on the web to find the link.)

## 2024-08-05 DIAGNOSIS — D631 Anemia in chronic kidney disease: Principal | ICD-10-CM

## 2024-08-05 DIAGNOSIS — E1122 Type 2 diabetes mellitus with diabetic chronic kidney disease: Principal | ICD-10-CM

## 2024-08-05 DIAGNOSIS — I5032 Chronic diastolic (congestive) heart failure: Principal | ICD-10-CM

## 2024-08-05 DIAGNOSIS — J440 Chronic obstructive pulmonary disease with acute lower respiratory infection: Principal | ICD-10-CM

## 2024-08-05 DIAGNOSIS — Z9981 Dependence on supplemental oxygen: Principal | ICD-10-CM

## 2024-08-05 DIAGNOSIS — G4733 Obstructive sleep apnea (adult) (pediatric): Principal | ICD-10-CM

## 2024-08-05 DIAGNOSIS — Z7951 Long term (current) use of inhaled steroids: Principal | ICD-10-CM

## 2024-08-05 DIAGNOSIS — E854 Organ-limited amyloidosis: Principal | ICD-10-CM

## 2024-08-05 DIAGNOSIS — Z556 Problems related to health literacy: Principal | ICD-10-CM

## 2024-08-05 DIAGNOSIS — N184 Chronic kidney disease, stage 4 (severe): Principal | ICD-10-CM

## 2024-08-05 DIAGNOSIS — I272 Pulmonary hypertension, unspecified: Principal | ICD-10-CM

## 2024-08-05 DIAGNOSIS — I4819 Other persistent atrial fibrillation: Principal | ICD-10-CM

## 2024-08-05 DIAGNOSIS — G629 Polyneuropathy, unspecified: Principal | ICD-10-CM

## 2024-08-05 DIAGNOSIS — L989 Disorder of the skin and subcutaneous tissue, unspecified: Principal | ICD-10-CM

## 2024-08-05 DIAGNOSIS — E079 Disorder of thyroid, unspecified: Principal | ICD-10-CM

## 2024-08-05 DIAGNOSIS — Z7901 Long term (current) use of anticoagulants: Principal | ICD-10-CM

## 2024-08-05 DIAGNOSIS — J9612 Chronic respiratory failure with hypercapnia: Principal | ICD-10-CM

## 2024-08-05 DIAGNOSIS — J189 Pneumonia, unspecified organism: Principal | ICD-10-CM

## 2024-08-05 DIAGNOSIS — J441 Chronic obstructive pulmonary disease with (acute) exacerbation: Principal | ICD-10-CM

## 2024-08-05 DIAGNOSIS — G8929 Other chronic pain: Principal | ICD-10-CM

## 2024-08-05 DIAGNOSIS — R339 Retention of urine, unspecified: Principal | ICD-10-CM

## 2024-08-05 DIAGNOSIS — J9611 Chronic respiratory failure with hypoxia: Principal | ICD-10-CM

## 2024-08-05 DIAGNOSIS — I110 Hypertensive heart disease with heart failure: Principal | ICD-10-CM

## 2024-08-07 DIAGNOSIS — Z7901 Long term (current) use of anticoagulants: Principal | ICD-10-CM

## 2024-08-07 DIAGNOSIS — D631 Anemia in chronic kidney disease: Principal | ICD-10-CM

## 2024-08-07 DIAGNOSIS — G8929 Other chronic pain: Principal | ICD-10-CM

## 2024-08-07 DIAGNOSIS — R052 Subacute cough: Principal | ICD-10-CM

## 2024-08-07 DIAGNOSIS — Z9981 Dependence on supplemental oxygen: Principal | ICD-10-CM

## 2024-08-07 DIAGNOSIS — J9611 Chronic respiratory failure with hypoxia: Principal | ICD-10-CM

## 2024-08-07 DIAGNOSIS — L989 Disorder of the skin and subcutaneous tissue, unspecified: Principal | ICD-10-CM

## 2024-08-07 DIAGNOSIS — J9612 Chronic respiratory failure with hypercapnia: Principal | ICD-10-CM

## 2024-08-07 DIAGNOSIS — G4733 Obstructive sleep apnea (adult) (pediatric): Principal | ICD-10-CM

## 2024-08-07 DIAGNOSIS — R339 Retention of urine, unspecified: Principal | ICD-10-CM

## 2024-08-07 DIAGNOSIS — J441 Chronic obstructive pulmonary disease with (acute) exacerbation: Principal | ICD-10-CM

## 2024-08-07 DIAGNOSIS — E079 Disorder of thyroid, unspecified: Principal | ICD-10-CM

## 2024-08-07 DIAGNOSIS — N184 Chronic kidney disease, stage 4 (severe): Principal | ICD-10-CM

## 2024-08-07 DIAGNOSIS — G629 Polyneuropathy, unspecified: Principal | ICD-10-CM

## 2024-08-07 DIAGNOSIS — E854 Organ-limited amyloidosis: Principal | ICD-10-CM

## 2024-08-07 DIAGNOSIS — J189 Pneumonia, unspecified organism: Principal | ICD-10-CM

## 2024-08-07 DIAGNOSIS — I110 Hypertensive heart disease with heart failure: Principal | ICD-10-CM

## 2024-08-07 DIAGNOSIS — Z556 Problems related to health literacy: Principal | ICD-10-CM

## 2024-08-07 DIAGNOSIS — I4819 Other persistent atrial fibrillation: Principal | ICD-10-CM

## 2024-08-07 DIAGNOSIS — J440 Chronic obstructive pulmonary disease with acute lower respiratory infection: Principal | ICD-10-CM

## 2024-08-07 DIAGNOSIS — J410 Simple chronic bronchitis: Principal | ICD-10-CM

## 2024-08-07 DIAGNOSIS — Z7951 Long term (current) use of inhaled steroids: Principal | ICD-10-CM

## 2024-08-07 DIAGNOSIS — I5032 Chronic diastolic (congestive) heart failure: Principal | ICD-10-CM

## 2024-08-07 DIAGNOSIS — I272 Pulmonary hypertension, unspecified: Principal | ICD-10-CM

## 2024-08-07 DIAGNOSIS — E1122 Type 2 diabetes mellitus with diabetic chronic kidney disease: Principal | ICD-10-CM

## 2024-08-07 MED ORDER — PREDNISONE 20 MG TABLET
ORAL_TABLET | Freq: Every day | ORAL | 0 refills | 5.00000 days | Status: CP
Start: 2024-08-07 — End: 2024-08-12

## 2024-08-07 MED ORDER — HYDROCODONE 10 MG-CHLORPHENIRAMINE 8 MG/5 ML ORAL SUSP EXTEND.REL 12HR
Freq: Two times a day (BID) | ORAL | 0 refills | 12.00000 days | Status: CP | PRN
Start: 2024-08-07 — End: ?

## 2024-08-07 NOTE — Unmapped (Signed)
 I called pt's daughter. Pt is still coughing a lot-bringing up clear sputum.   Taking Advair  Spiriva , albuterol  prn  Last CXR 8/2:   1.  Pulmonary edema with small bilateral pleural effusions.      2.  Bibasilar hazy opacities may represent atelectasis or pneumonitis.     Is taking Augmenin. Diuretics have been increased.   Imp: Cough-? COPD vs post viral vs untreated bacterial process  Plan:   Sputum culture  Pred 40 mg x 3 days.   Refill opiate containing cough syrup.

## 2024-08-08 DIAGNOSIS — I5032 Chronic diastolic (congestive) heart failure: Principal | ICD-10-CM

## 2024-08-08 DIAGNOSIS — E079 Disorder of thyroid, unspecified: Principal | ICD-10-CM

## 2024-08-08 DIAGNOSIS — E854 Organ-limited amyloidosis: Principal | ICD-10-CM

## 2024-08-08 DIAGNOSIS — R339 Retention of urine, unspecified: Principal | ICD-10-CM

## 2024-08-08 DIAGNOSIS — I110 Hypertensive heart disease with heart failure: Principal | ICD-10-CM

## 2024-08-08 DIAGNOSIS — L989 Disorder of the skin and subcutaneous tissue, unspecified: Principal | ICD-10-CM

## 2024-08-08 DIAGNOSIS — N184 Chronic kidney disease, stage 4 (severe): Principal | ICD-10-CM

## 2024-08-08 DIAGNOSIS — J9611 Chronic respiratory failure with hypoxia: Principal | ICD-10-CM

## 2024-08-08 DIAGNOSIS — Z556 Problems related to health literacy: Principal | ICD-10-CM

## 2024-08-08 DIAGNOSIS — J440 Chronic obstructive pulmonary disease with acute lower respiratory infection: Principal | ICD-10-CM

## 2024-08-08 DIAGNOSIS — J441 Chronic obstructive pulmonary disease with (acute) exacerbation: Principal | ICD-10-CM

## 2024-08-08 DIAGNOSIS — Z7951 Long term (current) use of inhaled steroids: Principal | ICD-10-CM

## 2024-08-08 DIAGNOSIS — I4819 Other persistent atrial fibrillation: Principal | ICD-10-CM

## 2024-08-08 DIAGNOSIS — I272 Pulmonary hypertension, unspecified: Principal | ICD-10-CM

## 2024-08-08 DIAGNOSIS — Z9981 Dependence on supplemental oxygen: Principal | ICD-10-CM

## 2024-08-08 DIAGNOSIS — J9612 Chronic respiratory failure with hypercapnia: Principal | ICD-10-CM

## 2024-08-08 DIAGNOSIS — G629 Polyneuropathy, unspecified: Principal | ICD-10-CM

## 2024-08-08 DIAGNOSIS — D631 Anemia in chronic kidney disease: Principal | ICD-10-CM

## 2024-08-08 DIAGNOSIS — Z7901 Long term (current) use of anticoagulants: Principal | ICD-10-CM

## 2024-08-08 DIAGNOSIS — E1122 Type 2 diabetes mellitus with diabetic chronic kidney disease: Principal | ICD-10-CM

## 2024-08-08 DIAGNOSIS — J189 Pneumonia, unspecified organism: Principal | ICD-10-CM

## 2024-08-08 DIAGNOSIS — G8929 Other chronic pain: Principal | ICD-10-CM

## 2024-08-08 DIAGNOSIS — G4733 Obstructive sleep apnea (adult) (pediatric): Principal | ICD-10-CM

## 2024-08-09 ENCOUNTER — Ambulatory Visit: Admit: 2024-08-09 | Discharge: 2024-08-10 | Payer: Medicare (Managed Care)

## 2024-08-09 DIAGNOSIS — R052 Subacute cough: Principal | ICD-10-CM

## 2024-08-11 MED ORDER — LEVOFLOXACIN 750 MG TABLET
ORAL_TABLET | ORAL | 0 refills | 8.00000 days | Status: CP
Start: 2024-08-11 — End: ?

## 2024-08-11 NOTE — Unmapped (Signed)
 Pt still coughing.   Did not take pred.   Sputum with pMNs and growing 3+pseudomonas  Impt: pseudomonias pneumonia/  Plan: levofloxacin  750 mg every other day x 4 doses (1 week)  Hold off on prednisone  for now  [facility] with cardiology vis a vis amiodarone 

## 2024-08-14 NOTE — Unmapped (Signed)
 Provider notified patient and verified every other day dosing

## 2024-08-15 NOTE — Unmapped (Signed)
 [facility] Refill Coordination Note    Specialty Medication(s) to be Shipped:   Cardiology: Vyndamax     Other medication(s) to be shipped: No additional medications requested for fill at this time    Specialty Medications not needed at this time: N/A     Barbara Huber, DOB: 11/18/1941  Phone: [phone] (home)       All above HIPAA information was verified with patient.     Was a Nurse, learning disability used for this call? No    Completed refill call assessment today to schedule patient's medication shipment from the [facility]  [phone]).  All relevant notes have been reviewed.     Specialty medication(s) and dose(s) confirmed: Regimen is correct and unchanged.   Changes to medications: Radonna reports no changes at this time.  Changes to insurance: No  New side effects reported not previously addressed with a pharmacist or physician: None reported  Questions for the pharmacist: No    Confirmed patient received a Conservation officer, historic buildings and a Surveyor, mining with first shipment. The patient will receive a drug information handout for each medication shipped and additional FDA Medication Guides as required.       DISEASE/MEDICATION-SPECIFIC INFORMATION        N/A    SPECIALTY MEDICATION ADHERENCE     Medication Adherence    Patient reported X missed doses in the last month: 0  Specialty Medication: Vyndamax  61mg   Patient is on additional specialty medications: No              Were doses missed due to medication being on hold? No    Vyndamax  61 mg: 10 days of medicine on hand     REFERRAL TO PHARMACIST     Referral to the pharmacist: Not needed      [facility]     Shipping address confirmed in Epic.     Cost and Payment: Patient has a $0 copay, payment information is not required.    Delivery Scheduled: Yes, Expected medication delivery date: 08/22/24.     Medication will be delivered via Next Day Courier to the prescription address in Epic WAM.    Barbara Huber, [facility]   [facility]  Specialty Pharmacist

## 2024-08-17 ENCOUNTER — Encounter: Admit: 2024-08-17 | Payer: Medicare (Managed Care)

## 2024-08-17 ENCOUNTER — Encounter: Admit: 2024-08-17 | Discharge: 2024-09-15 | Payer: Medicare (Managed Care)

## 2024-08-19 MED ORDER — FERROUS SULFATE 325 MG (65 MG IRON) TABLET
ORAL_TABLET | Freq: Every day | ORAL | 0 refills | 0.00000 days
Start: 2024-08-19 — End: ?

## 2024-08-21 MED FILL — VYNDAMAX 61 MG CAPSULE: ORAL | 30 days supply | Qty: 30 | Fill #8

## 2024-08-23 MED ORDER — FERROUS SULFATE 325 MG (65 MG IRON) TABLET
ORAL_TABLET | Freq: Every day | ORAL | 0 refills | 90.00000 days | Status: CP
Start: 2024-08-23 — End: ?

## 2024-08-24 DIAGNOSIS — R052 Subacute cough: Principal | ICD-10-CM

## 2024-08-24 MED ORDER — HYDROCODONE 10 MG-CHLORPHENIRAMINE 8 MG/5 ML ORAL SUSP EXTEND.REL 12HR
Freq: Two times a day (BID) | ORAL | 0 refills | 12.00000 days | PRN
Start: 2024-08-24 — End: ?

## 2024-08-25 ENCOUNTER — Ambulatory Visit: Admit: 2024-08-25 | Discharge: 2024-08-25 | Payer: Medicare (Managed Care)

## 2024-08-25 ENCOUNTER — Inpatient Hospital Stay: Admit: 2024-08-25 | Discharge: 2024-08-25 | Payer: Medicare (Managed Care)

## 2024-08-25 LAB — CBC W/ AUTO DIFF
BASOPHILS ABSOLUTE COUNT: 0.1 10*9/L (ref 0.0–0.1)
BASOPHILS RELATIVE PERCENT: 1 %
EOSINOPHILS ABSOLUTE COUNT: 0.3 10*9/L (ref 0.0–0.5)
EOSINOPHILS RELATIVE PERCENT: 5.1 %
HEMATOCRIT: 35.6 % (ref 34.0–44.0)
HEMOGLOBIN: 11.6 g/dL (ref 11.3–14.9)
LYMPHOCYTES ABSOLUTE COUNT: 1 10*9/L — ABNORMAL LOW (ref 1.1–3.6)
LYMPHOCYTES RELATIVE PERCENT: 15.1 %
MEAN CORPUSCULAR HEMOGLOBIN CONC: 32.7 g/dL (ref 32.0–36.0)
MEAN CORPUSCULAR HEMOGLOBIN: 30 pg (ref 25.9–32.4)
MEAN CORPUSCULAR VOLUME: 91.7 fL (ref 77.6–95.7)
MEAN PLATELET VOLUME: 8.1 fL (ref 6.8–10.7)
MONOCYTES ABSOLUTE COUNT: 0.8 10*9/L (ref 0.3–0.8)
MONOCYTES RELATIVE PERCENT: 13 %
NEUTROPHILS ABSOLUTE COUNT: 4.3 10*9/L (ref 1.8–7.8)
NEUTROPHILS RELATIVE PERCENT: 65.8 %
NUCLEATED RED BLOOD CELLS: 0 /100{WBCs} (ref <=4)
PLATELET COUNT: 160 10*9/L (ref 150–450)
RED BLOOD CELL COUNT: 3.88 10*12/L — ABNORMAL LOW (ref 3.95–5.13)
RED CELL DISTRIBUTION WIDTH: 15.3 % — ABNORMAL HIGH (ref 12.2–15.2)
WBC ADJUSTED: 6.5 10*9/L (ref 3.6–11.2)

## 2024-08-25 LAB — BASIC METABOLIC PANEL
ANION GAP: 13 mmol/L (ref 5–14)
BLOOD UREA NITROGEN: 49 mg/dL — ABNORMAL HIGH (ref 9–23)
BUN / CREAT RATIO: 32
CALCIUM: 8.9 mg/dL (ref 8.7–10.4)
CHLORIDE: 101 mmol/L (ref 98–107)
CO2: 26.8 mmol/L (ref 20.0–31.0)
CREATININE: 1.54 mg/dL — ABNORMAL HIGH (ref 0.55–1.02)
EGFR CKD-EPI (2021) FEMALE: 34 mL/min/1.73m2 — ABNORMAL LOW (ref >=60)
GLUCOSE RANDOM: 115 mg/dL (ref 70–179)
POTASSIUM: 3.9 mmol/L (ref 3.4–4.8)
SODIUM: 141 mmol/L (ref 135–145)

## 2024-08-25 MED ORDER — HYDROCODONE 10 MG-CHLORPHENIRAMINE 8 MG/5 ML ORAL SUSP EXTEND.REL 12HR
Freq: Two times a day (BID) | ORAL | 0 refills | 12.00000 days | Status: CP | PRN
Start: 2024-08-25 — End: ?

## 2024-08-30 DIAGNOSIS — G8929 Other chronic pain: Principal | ICD-10-CM

## 2024-08-30 DIAGNOSIS — I43 Cardiomyopathy in diseases classified elsewhere: Principal | ICD-10-CM

## 2024-08-30 DIAGNOSIS — R339 Retention of urine, unspecified: Principal | ICD-10-CM

## 2024-08-30 DIAGNOSIS — J449 Chronic obstructive pulmonary disease, unspecified: Principal | ICD-10-CM

## 2024-08-30 DIAGNOSIS — K219 Gastro-esophageal reflux disease without esophagitis: Principal | ICD-10-CM

## 2024-08-30 DIAGNOSIS — I272 Pulmonary hypertension, unspecified: Principal | ICD-10-CM

## 2024-08-30 DIAGNOSIS — E079 Disorder of thyroid, unspecified: Principal | ICD-10-CM

## 2024-08-30 DIAGNOSIS — E854 Organ-limited amyloidosis: Principal | ICD-10-CM

## 2024-08-30 DIAGNOSIS — D500 Iron deficiency anemia secondary to blood loss (chronic): Principal | ICD-10-CM

## 2024-08-30 DIAGNOSIS — I4819 Other persistent atrial fibrillation: Principal | ICD-10-CM

## 2024-08-30 DIAGNOSIS — K5909 Other constipation: Principal | ICD-10-CM

## 2024-08-30 DIAGNOSIS — G4733 Obstructive sleep apnea (adult) (pediatric): Principal | ICD-10-CM

## 2024-08-30 DIAGNOSIS — I10 Essential (primary) hypertension: Principal | ICD-10-CM

## 2024-08-30 DIAGNOSIS — G629 Polyneuropathy, unspecified: Principal | ICD-10-CM

## 2024-08-30 DIAGNOSIS — D631 Anemia in chronic kidney disease: Principal | ICD-10-CM

## 2024-08-30 DIAGNOSIS — J9612 Chronic respiratory failure with hypercapnia: Principal | ICD-10-CM

## 2024-08-30 DIAGNOSIS — J9611 Chronic respiratory failure with hypoxia: Principal | ICD-10-CM

## 2024-08-30 DIAGNOSIS — M4727 Other spondylosis with radiculopathy, lumbosacral region: Principal | ICD-10-CM

## 2024-08-30 DIAGNOSIS — Z556 Problems related to health literacy: Principal | ICD-10-CM

## 2024-08-30 DIAGNOSIS — Z7951 Long term (current) use of inhaled steroids: Principal | ICD-10-CM

## 2024-08-30 DIAGNOSIS — N184 Chronic kidney disease, stage 4 (severe): Principal | ICD-10-CM

## 2024-08-30 DIAGNOSIS — D696 Thrombocytopenia, unspecified: Principal | ICD-10-CM

## 2024-08-30 DIAGNOSIS — E1122 Type 2 diabetes mellitus with diabetic chronic kidney disease: Principal | ICD-10-CM

## 2024-08-30 DIAGNOSIS — Z9981 Dependence on supplemental oxygen: Principal | ICD-10-CM

## 2024-08-30 DIAGNOSIS — I5032 Chronic diastolic (congestive) heart failure: Principal | ICD-10-CM

## 2024-09-02 NOTE — Unmapped (Signed)
 Internal Medicine Clinic Visit  Video Visit    Reason for visit: follow up    A/P:  Assessment & Plan    Cough:   CXR 08/25/2028  Diff Dx: Heart failure (getting better with increased diuresis)  COPD-stable on inhalers  Infection: grew pseudomonas. Did get one week of renally dosed levofloxacin . Last Xray did not show pneumonia  Plan: add nasal steroid.   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 Recent weight fluctuations, currently 163.8 lbs post-diuretic adjustment. On tafamidis  and butresiran.  - Continue torsemide  100 mg once daily. Will communicate with Damien Lee of cardiology  - Monitor weight closely.  - Check kidney function during upcoming appointment with Dr. LaVarge.    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 Oxygen  saturation 94-97% on 3 liters. Cough improved.  - Continue current inhaler regimen.  - Consider nebulizer with normal saline for expectoration.  - Follow up with pulmonologist Dr. LaVarge next week.    Chronic kidney disease,   Monitor kidney function due to diuretic use.  - Check kidney function during upcoming appointment with Dr. Janie.      Essential hypertension  Essential hypertension, well-controlled. Recent BP 129/69 mmHg.    General Health Maintenance  Routine health maintenance discussed. Recent thyroid levels normal.  - Check hemoglobin A1c and thyroid levels during next blood work.    Follow-Up  - Follow up in 3 months or as needed.  - Appointment with Dr. Janie next week.  - Appointment with Damien on October 15th.      Health Maintenance  Health Maintenance Due   Topic Date Due    COVID-19 Vaccine (5 - 2025-26 season) 08/28/2024    Influenza Vaccine (1) 08/28/2024        Return in about 3 months (around 12/04/2024).    Orders Placed This Encounter Procedures    Basic metabolic panel    TSH    Hemoglobin A1c      HPI:    History of Present Illness  Barbara Huber is an 83 year old female with [name], COPD, and heart failure with preserved ejection fraction from amyloidosis who presents with respiratory symptoms and fluid management issues. She is accompanied by her daughter, Claud.    She has experienced improvement in her cough, describing it as 'better,' although she continues to have thick mucus. She uses Mucinex  600 mg twice daily and Safe Tussin, which contains dextromethorphan  and guaifenesin , as needed. Inhalers and occasional nebulizer use sometimes aid in mucus clearance. She also uses hydrocodone  based cough syrup as needed.     Her weight is currently 163 pounds, down from a recent high of 169.3 pounds. Recently, she took 100 mg of torsemide  once daily, which helped reduce her weight. She also used metolazone  80 mg on one occasion when her weight increased significantly.    Her blood pressure is stable, and her oxygen  saturation is between 94-97% on 3 liters of oxygen . She urinates well and has no current issues with blood sugar, which remains stable around 80-100 mg/dL. She is on tafamidis  and butresiran for amyloidosis and fosfomycin to prevent UTIs.    She receives physical therapy once a week. Home health services have concluded.    PTHomeBP   Problem List:  Problem List[1]    Medications:  Reviewed  in EPIC  Soc History and Family History reviewed in Epic    Physical Exam:   Vital Signs:  Vitals:    09/04/24 1309   Weight: 74.3 kg (163 lb 12.8 oz)   Height: 167.6 cm (5' 6)     Wt Readings from Last 3 Encounters:   09/04/24 74.3 kg (163 lb 12.8 oz)   08/02/24 76.7 kg (169 lb)   08/02/24 76.2 kg (168 lb)      Physical Exam  VITALS: BP- 129/69, SaO2- 95%  MEASUREMENTS: Weight- 163.    Gen: Well appearing, NAD, wearing oxygen   Records review  Results  LABS  Blood glucose: 100 mg/dL (90/92/7974)  TSH: within normal limits (August 2025)    RADIOLOGY  Chest X-ray: pleural effusion noted    Lab Results   Component Value Date    CREATININE 1.54 (H) 08/25/2024    CHOL 95 12/06/2021    HDL 42 12/06/2021    LDL 40 12/06/2021    NONHDL 53 (L) 12/06/2021    TRIG 66 12/06/2021    A1C 4.8 03/03/2024         The ASCVD Risk score (Arnett DK, et al., 2019) failed to calculate.     Medication adherence and barriers to the treatment plan have been addressed. Opportunities to optimize healthy behaviors have been discussed. Patient / caregiver voiced understanding.    The patient reports they are physically located in [place]  and is currently: at home. I conducted a audio/video visit. I spent  56m 50s on the video call with the patient. I spent an additional 15 minutes on pre- and post-visit activities on the date of service .                 [1]   Patient Active Problem List  Diagnosis    Spinal stenosis of lumbar region    Thoracic or lumbosacral neuritis or radiculitis    Lumbosacral spondylosis    COPD (chronic obstructive pulmonary disease)    (CMS-HCC)    Sleep apnea in adult    Essential hypertension    Type 2 diabetes mellitus with stage 4 chronic kidney disease, with long-term current use of insulin     (CMS-HCC)    Chronic cystitis    Incomplete bladder emptying    Peripheral neuropathy    Chronic respiratory failure with hypoxia    (CMS-HCC)    Atrial fibrillation    (CMS-HCC)    Anemia in chronic kidney disease    Chronic kidney disease ([name]), stage IV (severe)    (CMS-HCC)    Pulmonary hypertension    (CMS-HCC)    Chronic prescription opiate use    Bronchitis    Chronic heart failure with preserved ejection fraction    (CMS-HCC)    Cardiac amyloidosis    (CMS-HCC)    Recurrent cold sores    Dyspepsia    Iron  deficiency anemia due to chronic blood loss    Neuropathy    Left hip pain    Familial amyloid heart disease    (CMS-HCC)    Hereditary amyloidosis    (CMS-HCC)    Osteoporosis with current pathological fracture    Acute exacerbation of CHF (congestive heart failure)    (CMS-HCC)    Troponin level elevated    Thrombocytopenia    Difficulty walking    Pneumonia due to infectious organism    Dyspnea    Acute on chronic hypoxic respiratory failure    (CMS-HCC)    Leukocytosis  Subacute cough

## 2024-09-03 DIAGNOSIS — R1013 Epigastric pain: Principal | ICD-10-CM

## 2024-09-03 MED ORDER — PANTOPRAZOLE 20 MG TABLET,DELAYED RELEASE
ORAL_TABLET | Freq: Every day | ORAL | 0 refills | 0.00000 days
Start: 2024-09-03 — End: ?

## 2024-09-04 DIAGNOSIS — R0982 Postnasal drip: Principal | ICD-10-CM

## 2024-09-04 DIAGNOSIS — N184 Chronic kidney disease, stage 4 (severe): Principal | ICD-10-CM

## 2024-09-04 DIAGNOSIS — I43 Cardiomyopathy in diseases classified elsewhere: Principal | ICD-10-CM

## 2024-09-04 DIAGNOSIS — E0590 Thyrotoxicosis, unspecified without thyrotoxic crisis or storm: Principal | ICD-10-CM

## 2024-09-04 DIAGNOSIS — E1122 Type 2 diabetes mellitus with diabetic chronic kidney disease: Principal | ICD-10-CM

## 2024-09-04 DIAGNOSIS — D696 Thrombocytopenia, unspecified: Principal | ICD-10-CM

## 2024-09-04 DIAGNOSIS — R053 Chronic cough: Principal | ICD-10-CM

## 2024-09-04 DIAGNOSIS — I5032 Chronic diastolic (congestive) heart failure: Principal | ICD-10-CM

## 2024-09-04 DIAGNOSIS — E854 Organ-limited amyloidosis: Principal | ICD-10-CM

## 2024-09-04 DIAGNOSIS — I10 Essential (primary) hypertension: Principal | ICD-10-CM

## 2024-09-04 DIAGNOSIS — Z794 Long term (current) use of insulin: Principal | ICD-10-CM

## 2024-09-04 MED ORDER — FLUTICASONE PROPIONATE 50 MCG/ACTUATION NASAL SPRAY,SUSPENSION
Freq: Every day | NASAL | 3 refills | 120.00000 days | Status: CP
Start: 2024-09-04 — End: 2025-09-04

## 2024-09-04 NOTE — Unmapped (Signed)
 Confirmed a video visit was completed today

## 2024-09-04 NOTE — Unmapped (Signed)
 [facility] Medicine at [facility]     Are you located in [place]? yes    Reason for visit: Follow up    Questions / Concerns that need to be addressed:     PTHomeBP     Diabetes:  Regularly checking blood sugars?: no  If yes, when? Complete log for past 7 days  Date Before Breakfast After Breakfast Before Lunch After Lunch Before Dinner After Dinner Before Bed    09/04/24  83                09/02/24  102               09/03/24  100                                                                                         HCDM reviewed and updated in Epic:    We are working to make sure all of our patients??? wishes are updated in Epic and part of that is documenting a Environmental health practitioner for each patient  A Health Care Decision Maker is someone you choose who can make health care decisions for you if you are not able - who would you most want to do this for you????  was updated.    HCDM (With Legal Document To Support): [name],[name] - Daughter - [phone]    BPAs completed:  Falls Risk - adults 65+    __________________________________________________________________________________________    SCREENINGS COMPLETED IN FLOWSHEETS      AUDIT       PHQ2       PHQ9          GAD7       COPD Assessment       Falls Risk

## 2024-09-05 MED ORDER — PANTOPRAZOLE 20 MG TABLET,DELAYED RELEASE
ORAL_TABLET | Freq: Every day | ORAL | 3 refills | 100.00000 days | Status: CP
Start: 2024-09-05 — End: ?

## 2024-09-07 NOTE — Unmapped (Signed)
 [facility] Refill Coordination Note    Specialty Medication(s) to be Shipped:   Cardiology: Vyndamax     Other medication(s) to be shipped: No additional medications requested for fill at this time    Specialty Medications not needed at this time: N/A     Barbara Huber, DOB: 25-Oct-1941  Phone: [phone] (home)       All above HIPAA information was verified with patient.     Was a Nurse, learning disability used for this call? No    Completed refill call assessment today to schedule patient's medication shipment from the [facility]  [phone]).  All relevant notes have been reviewed.     Specialty medication(s) and dose(s) confirmed: Regimen is correct and unchanged.   Changes to medications: Mera reports no changes at this time.  Changes to insurance: No  New side effects reported not previously addressed with a pharmacist or physician: None reported  Questions for the pharmacist: No    Confirmed patient received a Conservation officer, historic buildings and a Surveyor, mining with first shipment. The patient will receive a drug information handout for each medication shipped and additional FDA Medication Guides as required.       DISEASE/MEDICATION-SPECIFIC INFORMATION        N/A    SPECIALTY MEDICATION ADHERENCE     Medication Adherence    Patient reported X missed doses in the last month: 0  Specialty Medication: Vyndamax  61mg   Patient is on additional specialty medications: No              Were doses missed due to medication being on hold? No    Vyndamax  61 mg: 10 days of medicine on hand     REFERRAL TO PHARMACIST     Referral to the pharmacist: Not needed      [facility]     Shipping address confirmed in Epic.     Cost and Payment: Patient has a $0 copay, payment information is not required.    Delivery Scheduled: Yes, Expected medication delivery date: 09/13/24.     Medication will be delivered via Same Day Courier to the prescription address in Epic WAM.    Alm Barbara Huber, [facility]   [facility]  Specialty Pharmacist

## 2024-09-13 MED FILL — VYNDAMAX 61 MG CAPSULE: ORAL | 30 days supply | Qty: 30 | Fill #9

## 2024-09-14 ENCOUNTER — Ambulatory Visit: Admit: 2024-09-14 | Discharge: 2024-09-14 | Payer: Medicare (Managed Care)

## 2024-09-14 ENCOUNTER — Inpatient Hospital Stay: Admit: 2024-09-14 | Discharge: 2024-09-14 | Payer: Medicare (Managed Care)

## 2024-09-14 DIAGNOSIS — R799 Abnormal finding of blood chemistry, unspecified: Principal | ICD-10-CM

## 2024-09-14 DIAGNOSIS — R052 Subacute cough: Principal | ICD-10-CM

## 2024-09-14 DIAGNOSIS — N184 Chronic kidney disease, stage 4 (severe): Principal | ICD-10-CM

## 2024-09-14 DIAGNOSIS — I50812 Chronic right heart failure: Principal | ICD-10-CM

## 2024-09-14 DIAGNOSIS — R0600 Dyspnea, unspecified: Principal | ICD-10-CM

## 2024-09-14 DIAGNOSIS — J9611 Chronic respiratory failure with hypoxia: Principal | ICD-10-CM

## 2024-09-14 DIAGNOSIS — J449 Chronic obstructive pulmonary disease, unspecified: Principal | ICD-10-CM

## 2024-09-14 DIAGNOSIS — I272 Pulmonary hypertension, unspecified: Principal | ICD-10-CM

## 2024-09-14 DIAGNOSIS — E0590 Thyrotoxicosis, unspecified without thyrotoxic crisis or storm: Principal | ICD-10-CM

## 2024-09-14 DIAGNOSIS — I5032 Chronic diastolic (congestive) heart failure: Principal | ICD-10-CM

## 2024-09-14 DIAGNOSIS — Z794 Long term (current) use of insulin: Principal | ICD-10-CM

## 2024-09-14 DIAGNOSIS — E1122 Type 2 diabetes mellitus with diabetic chronic kidney disease: Principal | ICD-10-CM

## 2024-09-14 DIAGNOSIS — J42 Unspecified chronic bronchitis: Principal | ICD-10-CM

## 2024-09-14 LAB — BASIC METABOLIC PANEL
ANION GAP: 15 mmol/L — ABNORMAL HIGH (ref 5–14)
BLOOD UREA NITROGEN: 60 mg/dL — ABNORMAL HIGH (ref 9–23)
BUN / CREAT RATIO: 34
CALCIUM: 9.2 mg/dL (ref 8.7–10.4)
CHLORIDE: 101 mmol/L (ref 98–107)
CO2: 24.5 mmol/L (ref 20.0–31.0)
CREATININE: 1.77 mg/dL — ABNORMAL HIGH (ref 0.55–1.02)
EGFR CKD-EPI (2021) FEMALE: 28 mL/min/1.73m2 — ABNORMAL LOW (ref >=60)
GLUCOSE RANDOM: 126 mg/dL (ref 70–179)
POTASSIUM: 4 mmol/L (ref 3.4–4.8)
SODIUM: 140 mmol/L (ref 135–145)

## 2024-09-14 LAB — HEMOGLOBIN A1C
ESTIMATED AVERAGE GLUCOSE: 85 mg/dL
HEMOGLOBIN A1C: 4.6 % — ABNORMAL LOW (ref 4.8–5.6)

## 2024-09-14 LAB — TSH: THYROID STIMULATING HORMONE: 8.128 u[IU]/mL — ABNORMAL HIGH (ref 0.550–4.780)

## 2024-09-14 LAB — PRO-BNP: PRO-BNP: 3617 pg/mL — ABNORMAL HIGH (ref <=300.0)

## 2024-09-14 MED ORDER — HYDROCODONE 10 MG-CHLORPHENIRAMINE 8 MG/5 ML ORAL SUSP EXTEND.REL 12HR
Freq: Two times a day (BID) | ORAL | 0 refills | 12.00000 days | Status: CP | PRN
Start: 2024-09-14 — End: ?

## 2024-09-14 MED ORDER — SODIUM CHLORIDE 3 % FOR NEBULIZATION
Freq: Four times a day (QID) | RESPIRATORY_TRACT | 11 refills | 8.00000 days | Status: CP | PRN
Start: 2024-09-14 — End: ?
  Filled 2024-10-16: qty 240, 15d supply, fill #0

## 2024-09-14 NOTE — Unmapped (Signed)
 [employer]  at [facility]  Pulmonary Hypertension Program                        Barbara Lynwood Abu, Huber--Director (Pulmonary)  Barbara Huber. Barbara Huber--Associate Director (Pulmonary)  Barbara Chatters, Huber (Cardiology)    Barbara Huber Nurse Coordinator  Barbara Huber Nurse Coordinator    [phone] office  [phone] fax             [name]  is a 83 y.o. female patient referred for work up and evaluation for pulmonary hypertension, COPD, and chronic hypoxemic respiratory failure.    Assessment:      Barbara Huber is a 83 y.o.female with COPD and chronic hypoxemic respiratory failure. She was previously referred to me for pulmonary hypertension, though this is now clearly dominant left heart/group 2 pathology (with small precapillary component, either secondary to [name] versus COPD).     Overall had been doing well from a COPD standpoint, though in last couple months with persistent cough and sputum, newly found to have Pseudomonas (and resistant Enterobacter) on sputum culture. PsA is a new finding and I do worry this is going to be very challenging to fully eliminate, and may be indicative of interim development of bronchiectasis in addition to chronic bronchitis. We talked some re: airway clearance today; I do worry re: burdens of this with her other medical problems. PFTs are stable today. She does feel cough/sputum are stable to improving; I do not see a role for another antibiotic course though documentation re: PsA clearance or not with another sputum culture would be helpful to know. She favors doing this at home. She does have Aerobika device now at home, and we discussed trying out hypertonic saline nebs after albuterol  treatment to get a feel for how she does with these sessions. Remains on non-DPI triple inhaler therapy plan.      Plan:       - Labs today, including repeat proBNP, chem panel, TFTs as also desired by PCP.  - CT chest, to better evaluate airways/bronchiectasis change.  - Repeat sputum culture, will place outpatient order and given sterile cups, she prefers trying to do this at home instead of inducing here.  - Trial airway clearance for now on PRN basis, using nebulized albuterol  followed by 3% HTS neb, then Aerobika in attempts to raise mucus. Depending on tolerance/burdens of this, and results of CT chest and sputum culture, may work to establish a BID standing treatments plan. May also benefit from seeing bronchiectasis RT for further support.  - Continue Advair  HFA and Spiriva  Respimat. Did not do well with previous trial of LABA/LAMA only (side effects with Stiolto), so will leave unchanged for now.   - Continue oxygen  at 3LPM including at night. Continue Trilogy/O2 at night. She is benefiting from AVAPS settings.   - Previous discussion re: formal PR, can continue to discuss in future if patient interested.  - Close followup with heart failure team.  - Flu vaccine given today.     Followup in 2 months.    I personally spent 54 minutes face-to-face and non-face-to-face in the care of this patient, which includes all pre, intra, and post visit time on the date of service.  All documented time was specific to the E/M visit and does not include any procedures that may have been performed.          Barbara Huber Barbara Huber  09/14/24  Subjective:        83 y.o. 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 Recalls COPD diagnosis dating back more than 10 years ago, no history of asthma or other lung conditions. Overall it sounds like she has been maintained on triple inhaler therapy (Advair  diskus, Spiriva  handihaler), with exacerbations averaging maybe once/year, usually not requiring hospitalization. Albuterol  PRN via MDI (tries to use BID by routine) or neb (rare use). In 06/2019 she was admitted to [facility] with left sided pneumonia, also treated for COPD exacerbation with steroids during this, course also complicated by AKI and Afib/RVR. Hypercapnic during admission and started on Trilogy for discharge. She also carries diagnosis of CHF, made a few years ago, at least 2 admissions (usually [place]) for diuresis. With recent admission did have very elevated proBNPs and discharged on higher dose of Lasix  (40 mg daily). Denies recent edema on this higher dose of diuretic. No orthopnea. She has not been doing a lot of physical activity during COVID, will notice very mild dyspnea with self care, no exertional CP or LH. Does find neuropathy can hinder her activity as well. Occasional cough when not having exacerbation, clear sputum. Did have hemoptysis with pneumonia, none outside of this. With first visit we discussed recommendation to repeat CXR, and also repeat TTE in stable outpatient state.    Subsequent history: At visit 10/2019 doing okay, occasional episodes of DOE but overall happy with level of function. Discussed potential for clinical trial enrollment, but she was feeling very well and did not want to do invasive testing unless changing. Admitted to [facility] 04/2020 for dyspnea and decompensated heart failure, diuresed and also given IV iron . At followup 06/2020 not quite back at baseline, but improving steadily. She was then admitted 07/2020 again for heart failure, and then again in 09/2020 for two weeks with decompensated heart failure, Afib/RVR, septic shock, and gangrenous cholecystitis. Shortly thereafter she was diagnosed with ATTR cardiac amyloid. Admitted 12/2020 with dyspnea and decompensated heart failure, diuresed and underwent DCCV. At followup that month slowing improving since discharge, though more hypoxemic than baseline, discussed anemia/iron  and changed inhalers to non-DPI formulations. At followup 01/2021 feeling much better, more active, started tafamidis , no additional changes made. At followup 04/2021 doing well, spirometry improved, no changes made. At followup 10/2021 doing okay, no changes. At followup 05/2022 doing well, brief recent ED stay for low sats which was due to [place] malfunction, changed Advair /Spiriva  to Stiolto to get rid of ICS, though this was ultimately not tolerated and went back to Advair /Spiriva . At followup 09/2022 visit early mainly for oxygen  need documentation, doing very well, no other changes. At followup 03/2023 doing well aside from fall with pelvic fracture in interim, creatinine continuing to increase, no changes from COPD standpoint. At followup 05/2024 noting  more DOE over the last month, unclear etiology though volume not markedly increased, checking labs. At followup 08/2024 discussed recent bothersome cough, possible pneumonia over the summer, Pseudomonas newly present on culture, previously treated with levofloxacin . Discussed airway clearance introduction with Aerobika, 3% HTS and albuterol , and repeating CT chest.    Interval history 08/2024: Since last visit  Coughing since July pneumonia  A little mucus, not bringing up all the way  In August given Augmentin  course, then levofloxacin  750 mg every other day  Latter started when sputum returning with PsA, plus Enterobacter  Helped maybe a little   Using [place] casually, not bringing up much more with this  Using albuterol  via neb, not often (less than daily)  Fluid  has been stable, 100-120 mg torsemide  daily  Increased a few weeks ago for increased edema, effective  All inhalers the same  Trilogy at night going well       Past medical, past surgical, family, and social histories reviewed and updated in Epic.    Review of systems is significant for:   A 12 point review of systems was negative except for pertinent items noted in the HPI.    Allergies   Allergen Reactions    Nitrofurantoin Anaphylaxis and Other (See Comments)    Lipitor  [Atorvastatin ] Muscle Pain     SOB, Headache, fatigue, sick on my stomach      Current Outpatient Medications   Medication Sig Dispense Refill    acetaminophen  (TYLENOL ) 500 MG tablet Take 2 tablets (1,000 mg total) by mouth Three (3) times a day as needed for pain.      albuterol  HFA 90 mcg/actuation inhaler Inhale 1 puff every six (6) hours as needed for wheezing. 8 g 3    amiodarone  (PACERONE ) 200 MG tablet Take 1 tablet (200 mg total) by mouth daily. 30 tablet 11    blood sugar diagnostic (ACCU-CHEK GUIDE TEST STRIPS) Strp Use to check blood sugar daily 50 strip 3    cranberry 500 mg cap Take 500 mg by mouth daily with evening meal.      cyclobenzaprine  (FLEXERIL ) 10 MG tablet Take 1 tablet (10 mg total) by mouth two (2) times a day as needed for muscle spasms. 60 tablet 11    ergocalciferol -1,250 mcg, 50,000 unit, (DRISDOL ) 1,250 mcg (50,000 unit) capsule Take 1 capsule (1,250 mcg total) by mouth every fourteen (14) days. 6 capsule 1    ferrous sulfate  325 (65 FE) MG tablet Take 1 tablet (325 mg total) by mouth in the morning. 90 tablet 0    fluticasone  propion-salmeterol (ADVAIR  HFA) 115-21 mcg/actuation inhaler INHALE 2 PUFFS TWICE A DAY 12 g 5    fluticasone  propionate (FLONASE ) 50 mcg/actuation nasal spray 1 spray into each nostril daily. 16 g 3    fosfomycin (MONUROL ) 3 gram Pack Take 3 g by mouth once a week. 4 packet 6    gabapentin  (NEURONTIN ) 100 MG capsule Take 1 capsule (100 mg total) by mouth two (2) times a day. 60 capsule 11    HYDROcodone -chlorpheniramine  polistirex (TUSSIONEX PENNKINETIC ) 10-8 mg/5 mL ER suspension Take 5 mL by mouth every twelve (12) hours as needed for cough. 115 mL 0    inhalational spacing device (AEROCHAMBER MV) Spcr 1 spacer for inhaler 1 each 0    lancets (ACCU-CHEK SOFTCLIX LANCETS) Misc CHECK BLOOD SUGAR DAILY 200 each 11    levoFLOXacin  (LEVAQUIN ) 750 MG tablet Take 1 tablet (750 mg total) by mouth every other day. 4 tablet 0  lidocaine  (ASPERCREME) 4 % patch Place 1 patch on the skin daily. Remove after 12 hours. 30 patch 2    midodrine  (PROAMATINE ) 5 MG tablet Take 1 tablet (5 mg total) by mouth three (3) times a day. 90 tablet 11    NARCAN 4 mg/actuation nasal spray 1 spray into alternating nostrils once as needed (opioid overdose). PRN - Emergency use.      nystatin  (MYCOSTATIN ) 100,000 unit/gram powder Apply to affected area 3 times daily 30 g 1    oxyCODONE  (ROXICODONE ) 5 MG immediate release tablet Take 1 tablet (5 mg total) by mouth every eight (8) hours as needed for pain.      OXYGEN -AIR DELIVERY SYSTEMS MISC 4 L by Miscellaneous route continuous.      pantoprazole  (PROTONIX ) 20 MG tablet TAKE 1 TABLET ONCE DAILY 100 tablet 3    rosuvastatin  (CRESTOR ) 5 MG tablet TAKE 1 TABLET EVERY OTHER DAY 45 tablet 3    spironolactone  (ALDACTONE ) 25 MG tablet Take 0.5 tablets (12.5 mg total) by mouth daily. 30 tablet 2    tafamidis  (VYNDAMAX ) 61 mg cap Take 1 capsule (61 mg) by mouth daily. 30 capsule 11    tiotropium bromide  (SPIRIVA  RESPIMAT) 2.5 mcg/actuation inhalation mist Inhale 2 puffs daily. 4 g 3    torsemide  (DEMADEX ) 20 MG tablet Take 3 tablets (60 mg total) by mouth two (2) times a day. 180 tablet 11    torsemide  (DEMADEX ) 20 MG tablet Take 20 mg by mouth daily. 100mg  total a day depending on amount of swelling patient have   Indications: accumulation of fluid resulting from chronic heart failure      vitamin A -3,000 mcg RAE, 10,000 UNIT, 3,000 mcg RAE (10,000 UNIT) capsule Take 3,000 mcg of RAE by mouth daily.      vutrisiran  (AMVUTTRA ) 25 mg/0.5 mL injection Inject 0.5 mL (25 mg total) under the skin Take as directed. Every 6 Weeks       No current facility-administered medications for this visit.          Objective:   Objective     Physical Exam:   BP 117/57 (BP Site: L Arm, BP Position: Sitting, BP Cuff Size: Medium)  - Pulse 67  - Temp 36.3 ??C (97.4 ??F) (Temporal)  - Ht 162.3 cm (5' 3.9)  - Wt 75.3 kg (166 lb)  - LMP  (LMP Unknown)  - SpO2 100%  - BMI 28.58 kg/m??   Body mass index is 28.58 kg/m??.    SpO2 at rest 100% 3LPM    Wt Readings from Last 6 Encounters:   09/14/24 75.3 kg (166 lb)   09/04/24 74.3 kg (163 lb 12.8 oz)   08/02/24 76.7 kg (169 lb)   08/02/24 76.2 kg (168 lb)   07/31/24 76.6 kg (168 lb 14.4 oz)   07/20/24 77.5 kg (170 lb 12.8 oz)     General Appearance:    Alert, cooperative, no distress, appears stated age.   HEENT:    Normocephalic, without obvious abnormality, atraumatic. PERRL, conjunctiva clear. Nares normal, no drainage, no sinus tenderness. Oropharynx with no lesions. No facial telangectasias.   Neck:    Supple, trachea midline, no adenopathy.    Lungs:    Distant breath sounds b/l, no crackles or wheezes, bibasilar rhonchi, respirations unlabored. No PA bruits.   Chest wall:   Expands symmetrically, no tenderness or deformity.   Cardiovascular:   Regular rate and rhythm, S1 and S2 normal, P2 not overly prominent, no murmur, rubs, or  gallop appreciated. Neck exam with prominent carotid impulse, no definite JVD visible when upright.   Abdomen:   Soft, non-tender, normal bowel sounds, no masses, no organomegaly   Extremities:     Extremities normal, no cyanosis, clubbing. Trace to 1+ pitting edema, similar. No sclerodactyly.   Skin:   No rashes or lesions.   Neuro/psych:      No focal neurologic deficits; mood/affect normal.     Diagnostic Review:    Cardiac testing summary:  TTE 06/14/24 (reviewed): LA normal, EF 60-65%, mod LVH       RV normal, mild TR, est PASP 57       No effusion. Est RAP by IVC 5-10  RHC/ICU PAC 08/2023: RA 18, PA 70/40, PCW 28  TTE 08/19/23: LA mod dil, EF >70%       RV normal, tr TR, can't est PASP       No effusion  TTE 01/2023 : LA mod dil, EF >70%       RV normal, est PASP 46       No effusion. IVC normal  TTE 11/2021: EF >70%, gr 3DD, sev LVH       RV mild dil, nl fxn       No effusion. IVC dil/poor insp collapse  TTE 03/2021:   RHC 01/09/21: RA 23, PA 85/40 (55), PCW 32       CO/CI 5.3/2.5 (f,td), PA sat 57%, PVR 4.2 WU  TTE 09/2020: LA mild dil, EF >55%       RV ULN size, HK, tr TR, can't est PASP       No effusion. IVC dil, poor insp collapse  TTE 04/2020: LA 41, EF >55%, LVH (16)       RV normal, mild HK, TAPSE 25, can't est PASP       No effusion. IVC normal.  TTE 10/2019: LA 42, EF >55%       RV mild dil/HK, can't est PASP       No effusion. IVC dil, poor insp collapse  TTE 06/2019: LA 38, EF 65-70%, grade 2 DD, LVH (13-14)       RV mild dil, low nl fxn, TAPSE 17, mild-mod Tr, est PASP 64+CVP       Tr effusion. IVC nl size, poor insp collapse  TTE 04/2018 (cone): LA 40, EF 60-65%, grade 1 DD       RV normal, mild TR, est PASP within the normal range       No effusion. IVC normal    CXR 08/25/24 (reviewed): increased prominent of interstitial markings       No consolidation, no effusion  CXR 08/2023: small bilat effusions       Moderate pulmonary edema, cardiomegaly  CXR 01/2023: small effusions       Stable cardiomegaly. Bilat lower lung opacities, similar  CXR 04/2022: Similar bilat mid and lower lung opacities.        Small bilateral pleural effusions.        Stable enlarged cardiac silhouette.   CXR 08/2020: cardiomegaly       Pulmonary vascular congestion with increased markings       Hyperinflated lungs, flat diagphragms  CXR 04/2020: cardiomegaly       Hazy interstitial opacities with vascular congestion       Likely trace L effusion.  CXR 08/2019:  stable cardiomegaly       Interval resolution of LLL consolidation.       Stable parenchymal scarring in  the left midlung zone. Interval decreased now trace left pleural effusion.    CTA chest 10/2018 (cone): no demonstrable pulmonary embolus. There is  no thoracic aortic aneurysm or dissection. There is calcification at  the origin of the left subclavian artery. Other visualized great  vessels appear unremarkable. There is aortic atherosclerosis. There  are foci of coronary artery calcification evident. There is a small  amount of pericardial fluid, within the physiologic range, stable.  Pericardium does not appear appreciably thickened.  Prominence of the main pulmonary outflow tract is noted with a  measured diameter of 3.7 cm.  Mediastinum/Nodes: There is a subcentimeter nodular opacity in the  left lobe of the thyroid, stable. There are multiple subcentimeter  mediastinal lymph nodes again noted. There are prominent right  paratracheal lymph nodes with largest lymph node in this area  measuring 1.8 x 1.4 cm, marginally smaller than on prior study.  Aortopulmonary window lymph nodes appear slightly smaller than on  previous study. No new lymph node prominence is evident on this  study. No esophageal lesions are evident.  Lungs/Pleura: There is atelectatic change in the lung base regions,  stable. There is no frank edema or consolidation. A mild degree of  centrilobular emphysematous change appears stable. No appreciable  pleural effusion.  Upper Abdomen: There is a cyst in the medial upper pole right kidney  measuring 1.5 x 1.5 cm. A cyst is noted in the lateral mid left  kidney measuring 1.5 x 1.3 cm. There is aortic atherosclerosis as  well as calcification in the proximal visualized great vessels.  Visualized upper abdominal structures otherwise appear unremarkable.  Musculoskeletal: There is stable anterior wedging of the L1  vertebral body. There is multilevel degenerative change in the  thoracic spine which appear stable. No blastic or lytic bone lesions  are evident. No evident chest wall lesions.     CT chest 04/2018 (cone): Cardiovascular: Mild cardiomegaly. Small pericardial  effusion/thickening. Left anterior descending and right coronary  atherosclerosis. Atherosclerotic nonaneurysmal thoracic aorta.  Dilated main pulmonary artery (3.6 cm diameter). No central  pulmonary emboli.  Mediastinum/Nodes: Subcentimeter hypodense posterior left thyroid  lobe nodule. Unremarkable esophagus. No axillary adenopathy.  Enlarged right paratracheal nodes up to 1.6 cm (series 2/image 54).  Enlarged 1.1 cm AP window node (series 2/image 51). No  pathologically enlarged hilar nodes.  Lungs/Pleura: No pneumothorax. Trace dependent left pleural  effusion. No right pleural effusion. No acute consolidative airspace  disease, lung masses or significant pulmonary nodules. Minimal  interlobular septal thickening throughout both lungs. Several mildly  thickened parenchymal bands in the dependent right middle lobe,  lingula and right lower lobe. No significant regions of  bronchiectasis. Mild centrilobular emphysema with mild diffuse  bronchial wall thickening.  Upper abdomen: Partially visualized simple 1.6 cm medial upper right  renal cyst. Mild scarring in the visualized upper right kidney.  Musculoskeletal: No aggressive appearing focal osseous lesions.  Stable mild chronic L1 vertebral compression fracture. Moderate  thoracic spondylosis.     V/Q scan : none    08/2019: 152 meters. HR 79->101, O2 sat 91->88% on 3LPM, needing 4LPM. Max Borg 2.    Pulmonary Function Testing:  Date FEV1 FVC TLC FRC RV DLCO    09/14/24  1.12 (59%)  1.66 (66%)        04/2021  1.12 (66%)  1.91 (86%)        08/2019  0.99 (56%)  1.83 (79%)     5.0 (26%)  04/2018  0.98 (59%)  1.88 (89%)  68%   63%   (29%)            Today's PFTs c/w mild obstruct, similar    Overnight oximetry 01/2021 (NIPPV/5L): 48 min <89%, avg 90%, nadir 81%, ODI 15  Overnight oximetry 08/2019: 20 min <89% (total time 6.5 hours), avg 90%, nadir 94%       Compliance data with very good usage  PSG : done in past but not available     Relevant Serologies:   Labs 08/2019: ANA pos 1:160 homogenous, ENA neg, RF/CCP neg  Labs 12/2018: ANCA neg, ACE 31    Routine Labs:        Date NTproBNP Creatinine  Potassium  Bicarbonate  Hemoglobin  abs eos    07/2024  6300 2.1 4.5 27 10.9     05/2024 10100 2.2 5.4 26      04/2024          03/2024 8183 2.6 4.6 26 11.9 100    08/2023 4K->26K         01/2023 BNP 840->601 3.2 4.6 24 13.3 200    07/2022 BNP 167 2.4 4.4  26 13.6     04/2022 BNP 200 2.6   31 11.7     07/2021 BNP 445 2.6   30  9.7  300    03/2021 BNP 567 1.5 4.1  31  8.2  200    12/2020 BNP 926->479 1.3 3.8  33  8.9     11/2020  3800   BNP 567              07/2020  BNP 603              05/2020  1970  1.3  4.3  32  11.2     10/2019  2220  1.1    34        08/2019  1410              07/2019  1270  0.9  4.1  29  10.0     06/2019  6600->13K             11/2018  BNP 253              08/2018  BNP 531

## 2024-09-14 NOTE — Unmapped (Addendum)
 Thank you for visiting the [facility] Pulmonary Hypertension [facility].    If labs were taken today, we will contact you if any results need to be addressed before next visit. Please call or Mychart for any specific questions about results.    If you have any questions or concerns, please call     Heron Hitt, MD  Leita Eans, RN, Pulmonary Hypertension Nurse Coordinator  Hardin Muckle, RN, Pulmonary Hypertension Nurse Coordinator    [phone].             Let's try our airway clearance: take an albuterol  nebulizer treatment, followed by SODIUM CHLORIDE  3% and then the Aerobika device, followed by coughing. I will put a little more information below re: airway clearance.  If you have a good sample at home, bring this in and I will put in an order with the lab.      Bronchiectasis 102: Airway Clearance    What is airway clearance?  Airway clearance is how we overcome the impaired mucociliary clearance that is present in bronchiectasis, regardless of the cause.  When mucus sits in your airways, it causes inflammation and possibly further damage to your lungs.  Mucus also holds bacteria within your airways, leading to more inflammation and recurrent exacerbations.  Because of this, it is critical to improve your airway clearance get the mucus out of your airways.    What are the options for airway clearance?  Active Cycle Breathing Technique (ACBT) and Huff Coughing: Active cycle of breathing technique (ACBT) combines different breathing techniques that help clear mucus from the lungs in three phases. The first phase helps you relax your airways. The second phase helps you to get air behind mucus and clears mucus. The third phase helps force the mucus out of your lungs.  Pros: Makes all of the other methods of clearance more effective. Free, portable, doesn't require cleaning.  Cons: Can take a little practice to get it right.  Exercise: 20 to 30 minutes of aerobic activity of your choice.  This includes Zumba classes, jumping on a trampoline, swimming, etc.  Pros: Can be inexpensive and exercise has other benefits like improving bone density, mood, and overall health  Cons: May not feel like exercising when you are sick or when the weather is bad.  If you haven't been exercising, it might be hard to know where to start.  In this case, ask me if pulmonary rehab might be right for you.  Nebulizing hypertonic saline (HTS): Using a nebulizer to inhale sodium chloride  3% or higher concentrations helps pull water  into the dried mucus in the airways, making it easier to dislodge it and cough it out.  It is also a little irritating to the airways so it can make your cough more forceful.  Pros: Treatments typically take 10-15 minutes and are very easy to do.  Lots of patients find it very effective.  Cons: Some insurance companies don't cover sodium chloride . Some people may need to use albuterol  before or with the hypertonic saline because it can make airways twitchy and narrow.  You need a nebulizer and to clean the nebulizer cups.  Hand-held oscillating positive expiratory pressure devices (OPEP): This includes the Acapella Choice, the flutter valve, the Pari-PEP, Acapella Duet, and the Aerobika.  The main ones I order are the Acapella Choice and the Aerobika.  The Acapella Choice is not used with a nebulizer while the Aerobika can be used with or without a nebulizer. You steadily blow out through the  device, which produces a flutter sound and wave.  This flutter wave is conducted down your airways to help shake and shear the mucus off your airway walls. It also helps to keep the airway open when you exhale, giving the mucus more space to get out.  Pros: Small and portable.  Pretty easy to get these devices (often through a durable medical equipment [DME] company).  Cons: Cleaning is still necessary.  Insurance coverage of each device varies.  Some devices are not carried by DME companies.  Effort dependent.  Vibralung: This device uses different frequencies of sound waves to provide OPEP to the different sized airways. You can nebulize hypertonic saline through it but you don't have to.  Pros: Effort independent, meaning that the Vibralung will provide these vibrations independent of your effort breathing in and out. Not jarring to the chest wall or your body.  Cons: Expensive but less that the Vest. Some insurance companies won't cover it because they consider it experimental or because they already paid for a Vest for you.  Percussion vest: There are four companies who make five different vests - [employer], RespirTech, SmartVest, and AffloVest. [employer]'s second Vest is the [place] and is entirely portable. It does weigh 17 pounds. The other portable vest is AffloVest. The vests are typically worn for 20 to 30 minutes per day, and provide pressure waves around your chest.  These waves are transmitted to your airways to help shake and shear the mucus off your airway walls. All of the vests except the [place] have a 30 day free trial.  Pros: Can be worn while nebulizing.  Effort independent, meaning that the vest will provide these vibrations even when you are sick or even asleep.  Minimal cleaning  Cons: Expensive (portion is covered by insurance if you have tried and failed other methods of airway clearance). Bulky so difficult to take on trips.  Postural drainage: With postural drainage, you get into a position that helps drain fluid out of the lung.  Because the airways of the lung branch out different directions, mucus drainage from the upper portions happens because of gravity.  Mucus doesn't drain well from the lower lobes because it would have to move up-hill.  Pros: Can be done as part of a yoga or Pilates class.  Inexpensive.  Cons: Do not perform right after a meal or if you have GI issues like delayed gastric emptying, gastroparesis, or severe GERD (reflux).   Chest physiotherapy: Either you or someone else claps a hand or a cup-device over your ribs and back, spending 5 to 7 minutes on each area of the chest.  This helps shake the mucus off the airway walls and is often done in conjunction with postural drainage.  Pros: Inexpensive. If done by someone else, method is effort-independent.  Cons: Can be hard to reach your back if you are doing it.  Need a caregiver who is around to perform therapy and is willing to dedicate time to doing it properly.    What method is the best method?  The best method is the one that works for you.  If you find it effective, you are more likely to do it.  If it fits into your life, you are more likely to do it.  We don't have studies that show that one device or method is better than another method or device.  As you can see, there are pros and cons to each.  You may find that you want  to use one device when you are well and another when you are sick.  You may want to use several devices at the same time, like nebulizing hypertonic saline through an [place] or performing postural drainage while wearing the vest.    Does this mean I'll cough up more sputum?  Not necessarily.  We notice if we cough up big globs of sputum but if the sputum is broken in to small pieces, they are swept up the airway and swallowed without us  paying much attention.  Some patients find that when they regularly do airway clearance, they don't cough up much sputum.  If the amount of sputum doesn't decrease, you are still getting benefit from doing the therapy.  Remember that for all of the mucus you've seen coming up from your lungs, there is more in there that hasn't come up, at least not until we help it come up.    So then, how do I know the method of airway clearance is working for me?  This where I ask for you to trust that it works and be patient when looking for benefit.  The measure of how well airway clearance is working is not sputum production.  Rather, it is improvement in lung function and a decrease in exacerbation number and severity.  These outcomes take time to be seen.     How often do I need to do airway clearance?  I recommend performing clearance at least twice a day: in the morning and in the evening.  When you perform clearance in the morning, you are moving out the mucus and secretions that have settled in your airways overnight so you can start the day off right.  When you perform clearance in the evening, you are clearing out the irritants, particles, and mucus that you inhaled during the day, allowing for you to get more restful sleep.    When you are sick with an exacerbation, or an acute worsening of your lung disease, you have more mucus that needs to be cleared out of your airways.  You should increase your airway clearance frequency to make up for this.  Even if you are on antibiotics, it is still very important to perform your airway clearance.  By moving the mucus out of your lungs, the environment is not as friendly to the bacteria, making it harder for them to stick around.    How do I clean my equipment and how often?  Nebulizer cups and hand-held airway clearance devices need to be cleaned with warm soapy sterile water  after each use.  Additionally, the cups and hand-held devices should be sterilized daily.  Options for sterilization may vary by device but typically include boiling for 5 minutes, soaking in rubbing alcohol (70% isopropyl alcohol) for 5 minutes and rinse with sterile water , or using a baby bottle sterilizer.  I do NOT recommend using vinegar to clean your equipment because many of my patients have bacteria that are not killed using vinegar.  To prevent bacteria from growing on your equipment (and then inhaling the bacteria), it is important to clean and sterilize your equipment daily.  Nebulizer cups and the tubing should be replaced by the DME company every 6 months.  Do not wash or boil your nebulizer tubing.    How do I get an Acapella Choice or an Aerobika or the Vibralung or a vest?  Four of the five vests are carried by the manufacturer. The fifth (AffloVest) is distributed by a DME  company. I complete a form and submit it to the company.  A representative should contact you regarding delivery.  They or I will need to know the measurement around the fullest part of your chest and the largest circumference of your abdomen (stomach) while sitting to ensure that the vest fits you.    Some DME companies carry the Acapella Choice or the Aerobika or Vibralung.  The Acapella is also available through [employer].  You want the dark green colored one, which is the high-flow device.  Make sure it is the Acapella Choice and not just the Acapella. The Acapella Choice can be taken apart completely to clean and sterilize it. The [place] device is available for purchase at http://www.aerobika-therapy.com/order-today/ but we have also been able to obtain it from a few DME companies.    If I'm admitted to the hospital, what will I do for airway clearance?   [facility] no longer provides manual chest physiotherapy during hospitalization.  Hypertonic saline, Acapella Choice, Aerobika devices, Vibralung, and the [employer] by [employer] are available for airway clearance during hospitalizations.  Because the Acapella Choice and the Aerobika are single use devices, they are yours to take home when you are discharged.  If you have a different brand of Vest that you LOVE, you can bring it with you to use in the hospital.    Dr. Blondie, which airway clearance method is your favorite?  Based on my experience with different devices and techniques, I have found that certain patients do well with certain devices.  I also have received lots of feedback from patients about their experiences with the devices.  I even tried out the two major percussion vests while at a conference.  All of this information helps guide my suggestions for airway clearance options for you, my patient.  I do not currently and have not previously received any gift or reward for getting patients to use a certain device.  My suggestions and advice are based on my experience and my patients' experiences with the devices.

## 2024-09-15 MED ORDER — GABAPENTIN 100 MG CAPSULE
ORAL_CAPSULE | Freq: Two times a day (BID) | ORAL | 0 refills | 0.00000 days
Start: 2024-09-15 — End: ?

## 2024-09-16 MED ORDER — GABAPENTIN 100 MG CAPSULE
ORAL_CAPSULE | Freq: Two times a day (BID) | ORAL | 11 refills | 30.00000 days
Start: 2024-09-16 — End: 2025-09-16

## 2024-09-18 MED ORDER — GABAPENTIN 100 MG CAPSULE
ORAL_CAPSULE | Freq: Two times a day (BID) | ORAL | 3 refills | 90.00000 days | Status: CP
Start: 2024-09-18 — End: 2025-09-18

## 2024-09-18 NOTE — Unmapped (Signed)
 Refill request received for patient.      Medication Requested: gabapentin   Last Office Visit: 08/02/2024   Next Office Visit: 09/16/2024  Last Prescriber: Damien Lee    Nurse refill requirements met? No  If not met, why: non-cardiac med    Sent to: Provider for signing  If sent to provider, which provider?: Damien Lee

## 2024-09-20 DIAGNOSIS — R052 Subacute cough: Principal | ICD-10-CM

## 2024-09-20 MED ORDER — HYDROCODONE 10 MG-CHLORPHENIRAMINE 8 MG/5 ML ORAL SUSP EXTEND.REL 12HR
Freq: Two times a day (BID) | ORAL | 0 refills | 12.00000 days | Status: CP | PRN
Start: 2024-09-20 — End: ?

## 2024-09-20 MED ORDER — METOLAZONE 5 MG TABLET
ORAL_TABLET | Freq: Every day | ORAL | 0 refills | 12.00000 days | Status: CP | PRN
Start: 2024-09-20 — End: 2024-10-20

## 2024-09-20 NOTE — Unmapped (Signed)
 Refill request received for patient.      Medication Requested: metolazone  5 mg tab   Last Office Visit: 08/02/2024   Next Office Visit: 10/11/2024  Last Prescriber: [name] Damien BODILY    Nurse refill requirements met? Yes  If not met, why:     Sent to: Pharmacy per protocol  If sent to provider, which provider?:

## 2024-10-04 NOTE — Unmapped (Signed)
 [facility] Refill Coordination Note    Specialty Medication(s) to be Shipped:   Cardiology: Vyndamax     Other medication(s) to be shipped: No additional medications requested for fill at this time    Specialty Medications not needed at this time: N/A     Barbara Huber, DOB: 06-14-1941  Phone: [phone] (home)       All above HIPAA information was verified with patient.     Was a Nurse, learning disability used for this call? No    Completed refill call assessment today to schedule patient's medication shipment from the [facility]  [phone]).  All relevant notes have been reviewed.     Specialty medication(s) and dose(s) confirmed: Regimen is correct and unchanged.   Changes to medications: Adasha reports no changes at this time.  Changes to insurance: No  New side effects reported not previously addressed with a pharmacist or physician: None reported  Questions for the pharmacist: No    Confirmed patient received a Conservation officer, historic buildings and a Surveyor, mining with first shipment. The patient will receive a drug information handout for each medication shipped and additional FDA Medication Guides as required.       DISEASE/MEDICATION-SPECIFIC INFORMATION        N/A    SPECIALTY MEDICATION ADHERENCE     Medication Adherence    Patient reported X missed doses in the last month: 0  Specialty Medication: Vyndamax  61mg   Patient is on additional specialty medications: No              Were doses missed due to medication being on hold? No    Vyndamax  61 mg: 8 days of medicine on hand     REFERRAL TO PHARMACIST     Referral to the pharmacist: Not needed      [facility]     Shipping address confirmed in Epic.     Cost and Payment: Patient has a $0 copay, payment information is not required.    Delivery Scheduled: Yes, Expected medication delivery date: 10/09/24.     Medication will be delivered via Same Day Courier to the prescription address in Epic WAM.    [name], [facility]   [facility]  Specialty Pharmacist

## 2024-10-08 MED FILL — VYNDAMAX 61 MG CAPSULE: ORAL | 30 days supply | Qty: 30 | Fill #10

## 2024-10-09 DIAGNOSIS — N390 Urinary tract infection, site not specified: Principal | ICD-10-CM

## 2024-10-09 MED ORDER — FOSFOMYCIN TROMETHAMINE 3 GRAM ORAL PACKET
PACK | ORAL | 0 refills | 0.00000 days
Start: 2024-10-09 — End: ?

## 2024-10-12 ENCOUNTER — Inpatient Hospital Stay: Admit: 2024-10-12 | Discharge: 2024-10-12 | Payer: Medicare (Managed Care)

## 2024-10-12 ENCOUNTER — Ambulatory Visit: Admit: 2024-10-12 | Discharge: 2024-10-12 | Payer: Medicare (Managed Care)

## 2024-10-12 DIAGNOSIS — J9611 Chronic respiratory failure with hypoxia: Principal | ICD-10-CM

## 2024-10-12 DIAGNOSIS — I5032 Chronic diastolic (congestive) heart failure: Principal | ICD-10-CM

## 2024-10-12 LAB — COMPREHENSIVE METABOLIC PANEL
ALBUMIN: 3.1 g/dL — ABNORMAL LOW (ref 3.4–5.0)
ALKALINE PHOSPHATASE: 111 U/L (ref 46–116)
ALT (SGPT): <7 U/L — ABNORMAL LOW (ref 10–49)
ANION GAP: 15 mmol/L — ABNORMAL HIGH (ref 5–14)
AST (SGOT): 23 U/L (ref <=34)
BILIRUBIN TOTAL: 0.4 mg/dL (ref 0.3–1.2)
BLOOD UREA NITROGEN: 77 mg/dL — ABNORMAL HIGH (ref 9–23)
BUN / CREAT RATIO: 34
CALCIUM: 9.2 mg/dL (ref 8.7–10.4)
CHLORIDE: 98 mmol/L (ref 98–107)
CO2: 26.5 mmol/L (ref 20.0–31.0)
CREATININE: 2.25 mg/dL — ABNORMAL HIGH (ref 0.55–1.02)
EGFR CKD-EPI (2021) FEMALE: 21 mL/min/1.73m2 — ABNORMAL LOW (ref >=60)
GLUCOSE RANDOM: 138 mg/dL (ref 70–179)
POTASSIUM: 4.4 mmol/L (ref 3.4–4.8)
PROTEIN TOTAL: 7.7 g/dL (ref 5.7–8.2)
SODIUM: 139 mmol/L (ref 135–145)

## 2024-10-12 LAB — CBC W/ AUTO DIFF
BASOPHILS ABSOLUTE COUNT: 0 10*9/L (ref 0.0–0.1)
BASOPHILS RELATIVE PERCENT: 0.7 %
EOSINOPHILS ABSOLUTE COUNT: 0.2 10*9/L (ref 0.0–0.5)
EOSINOPHILS RELATIVE PERCENT: 2.9 %
HEMATOCRIT: 36.6 % (ref 34.0–44.0)
HEMOGLOBIN: 12.2 g/dL (ref 11.3–14.9)
LYMPHOCYTES ABSOLUTE COUNT: 1 10*9/L — ABNORMAL LOW (ref 1.1–3.6)
LYMPHOCYTES RELATIVE PERCENT: 19.2 %
MEAN CORPUSCULAR HEMOGLOBIN CONC: 33.3 g/dL (ref 32.0–36.0)
MEAN CORPUSCULAR HEMOGLOBIN: 30.5 pg (ref 25.9–32.4)
MEAN CORPUSCULAR VOLUME: 91.7 fL (ref 77.6–95.7)
MEAN PLATELET VOLUME: 8.2 fL (ref 6.8–10.7)
MONOCYTES ABSOLUTE COUNT: 0.6 10*9/L (ref 0.3–0.8)
MONOCYTES RELATIVE PERCENT: 11.5 %
NEUTROPHILS ABSOLUTE COUNT: 3.6 10*9/L (ref 1.8–7.8)
NEUTROPHILS RELATIVE PERCENT: 65.7 %
NUCLEATED RED BLOOD CELLS: 0 /100{WBCs} (ref <=4)
PLATELET COUNT: 172 10*9/L (ref 150–450)
RED BLOOD CELL COUNT: 3.99 10*12/L (ref 3.95–5.13)
RED CELL DISTRIBUTION WIDTH: 14.8 % (ref 12.2–15.2)
WBC ADJUSTED: 5.4 10*9/L (ref 3.6–11.2)

## 2024-10-12 LAB — PRO-BNP: PRO-BNP: 3602 pg/mL — ABNORMAL HIGH (ref <=300.0)

## 2024-10-12 LAB — MAGNESIUM: MAGNESIUM: 2.8 mg/dL — ABNORMAL HIGH (ref 1.6–2.6)

## 2024-10-13 MED ORDER — FOSFOMYCIN TROMETHAMINE 3 GRAM ORAL PACKET
PACK | ORAL | 5 refills | 28.00000 days | Status: CP
Start: 2024-10-13 — End: ?

## 2024-10-16 DIAGNOSIS — I43 Cardiomyopathy in diseases classified elsewhere: Principal | ICD-10-CM

## 2024-10-16 DIAGNOSIS — Z79899 Other long term (current) drug therapy: Principal | ICD-10-CM

## 2024-10-16 DIAGNOSIS — E854 Organ-limited amyloidosis: Principal | ICD-10-CM

## 2024-10-16 DIAGNOSIS — R7989 Other specified abnormal findings of blood chemistry: Principal | ICD-10-CM

## 2024-10-16 NOTE — Unmapped (Signed)
 Internal Medicine Clinic Visit  Person Contacted: Patient  Contact Phone number: [phone] (home)   Is there someone else in the room? Yes. What is your relationship? daughter. Do you want this person here for the visit? no.  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 Weight stable. ProBNP stable.  - Continue torsemide  120 mg oral daily, adjust dose based on symptoms.  - Repeat thyroid and kidney function tests in November.      Chronic kidney disease, stage IV  Stage IV with recent creatinine increase and possible mild dehydration.  - Repeat kidney function test in November.  - Adjust torsemide  dose if symptoms of dehydration occur.    Chronic cough  Improved after pulmonology visit. Possible post-nasal drip or allergies. Sputum culture pending.  - Use nebulized albuterol  followed by 3% hypertonic saline and Aerobika.  - Take Zyrtec for possible allergy-related symptoms.  - Submit sputum sample for culture if available.  - Schedule chest CT as recommended by pulmonology, though less urgent now that pt's cough is better.          Health Maintenance    Next Visit:   Recheck TSH, BMP.   Return in about 3 months (around 01/16/2025) for 3 months video visit.    HPI:     History of Present Illness  Barbara Huber is an 83 year old female with cardiac amyloidosis and COPD and [name] who presents for follow-up of her chronic cough. She is accompanied by her daughter, Barbara Huber.    She has a history of chronic cough, which has been evaluated by pulmonology. A chest CT and repeat sputum culture were recommended, along with the use of nebulized albuterol  followed by 3% hypertonic saline and Aerobika to help raise mucus. Her daughter notes significant improvement in the cough, although she occasionally experiences 'wheezy gurgling' in her throat. A sputum sample was collected when the cough worsened briefly and was submitted for analysis.    She has cardiac amyloidosis and atrial fibrillation, for which she is taking amiodarone . Recent lab work on October 16th showed a creatinine level of 2.25, up from 1.7, with a GFR of 21. Her proBNP was 3600. She is not taking any thyroid medication, but her thyroid levels are being monitored due to potential effects from amiodarone .    She manages her hypertension with torsemide , taking 120 mg recently, although she occasionally reduces the dose to 100 mg if she feels she is urinating excessively. She is not currently taking metolazone . Her blood pressure is reported as good, and she has no current leg swelling.    Her weight is stable at 167 pounds. She is not taking any magnesium  supplements, although her magnesium  level was noted to be slightly elevated. She is also taking fosfomycin once a week to prevent urinary infections.    No current swelling in her legs and her breathing feels okay. No magnesium  supplements or thyroid medication. Blood pressure is reported as good, and no current leg swelling.      Problem List:  Problem List[1]    Medications:  Reviewed in EPIC    Social history:   Lives with daughter    Physical Exam:   Vital Signs:  BP Readings from Last 3 Encounters:   09/15/24 116/64   09/14/24 117/57   09/06/24 122/66      Wt Readings  from Last 3 Encounters:   10/16/24 75.8 kg (167 lb)   09/14/24 75.3 kg (166 lb)   09/04/24 74.3 kg (163 lb 12.8 oz)      General appearance -   looks well, wearing oxygen     Records review  Lab Results   Component Value Date    A1C 4.6 (L) 09/14/2024   CXR 10/12/2024  Bilateral interstitial opacities could represent chronic interstitial lung disease or pulmonary edema. Consider obtaining CT chest for further characterization.      Health Maintenance Due   Topic Date Due    DEXA Scan  Never done    Retinal Eye Exam  Never done    DTaP/Tdap/Td Vaccines (1 - Tdap) Never done    Medicare Annual Wellness Visit (AWV) 11/25/2018    Foot Exam  02/04/2022    COVID-19 Vaccine (5 - 2025-26 season) 08/28/2024      The ASCVD Risk score (Arnett DK, et al., 2019) failed to calculate.     Medication adherence and barriers to the treatment plan have been addressed. Opportunities to optimize healthy behaviors have been discussed. Patient / caregiver voiced understanding.      The patient reports they are physically located in [place]  and is currently: at home. I conducted a audio/video visit. I spent  49m 46s on the video call with the patient. I spent an additional 15 minutes on pre- and post-visit activities on the date of service .             [1]   Patient Active Problem List  Diagnosis    Spinal stenosis of lumbar region    Thoracic or lumbosacral neuritis or radiculitis    Lumbosacral spondylosis    COPD (chronic obstructive pulmonary disease) (CMS-HCC)    Sleep apnea in adult    Essential hypertension    Type 2 diabetes mellitus with stage 4 chronic kidney disease, with long-term current use of insulin  (CMS-HCC)    Chronic cystitis    Incomplete bladder emptying    Peripheral neuropathy    Chronic respiratory failure with hypoxia    (CMS-HCC)    Atrial fibrillation    (CMS-HCC)    Anemia in chronic kidney disease    Chronic kidney disease ([name]), stage IV (severe)    (CMS-HCC)    Pulmonary hypertension    (CMS-HCC)    Chronic prescription opiate use    Bronchitis    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)    Cardiac amyloidosis (CMS-HCC)    Recurrent cold sores    Dyspepsia    Iron  deficiency anemia due to chronic blood loss    Neuropathy    Left hip pain    Familial amyloid heart disease (CMS-HCC)    Hereditary amyloidosis    (CMS-HCC)    Osteoporosis with current pathological fracture    Acute exacerbation of CHF (congestive heart failure) (CMS-HCC)    Troponin level elevated    Thrombocytopenia    Difficulty walking    Pneumonia due to infectious organism    Dyspnea    Acute on chronic hypoxic respiratory failure    (CMS-HCC)    Leukocytosis    Subacute cough

## 2024-10-16 NOTE — Unmapped (Signed)
 [facility] at [facility]     Are you located in [place]? yes    Reason for visit: Cough    Questions / Concerns that need to be addressed: no    PTHomeBP     HCDM reviewed and updated in Epic:    We are working to make sure all of our patients??? wishes are updated in Epic and part of that is documenting a Environmental health practitioner for each patient  A Health Care Decision Maker is someone you choose who can make health care decisions for you if you are not able - who would you most want to do this for you????  is already up to date.    HCDM (With Legal Document To Support): [name],[name] - Daughter - [phone]    BPAs completed:  AUDIT - Alcohol Screen, COPD - MMRC symptom assessment, and SDOH Screening    __________________________________________________________________________________________    SCREENINGS COMPLETED IN FLOWSHEETS    AUDIT  AUDIT - C Score (Part 1): 0

## 2024-10-18 LAB — CBC W/ AUTO DIFF
BASOPHILS ABSOLUTE COUNT: 0 10*9/L (ref 0.0–0.1)
BASOPHILS RELATIVE PERCENT: 0.4 %
EOSINOPHILS ABSOLUTE COUNT: 0.1 10*9/L (ref 0.0–0.5)
EOSINOPHILS RELATIVE PERCENT: 1.2 %
HEMATOCRIT: 39.8 % (ref 34.0–44.0)
HEMOGLOBIN: 13 g/dL (ref 11.3–14.9)
LYMPHOCYTES ABSOLUTE COUNT: 1 10*9/L — ABNORMAL LOW (ref 1.1–3.6)
LYMPHOCYTES RELATIVE PERCENT: 8.4 %
MEAN CORPUSCULAR HEMOGLOBIN CONC: 32.6 g/dL (ref 32.0–36.0)
MEAN CORPUSCULAR HEMOGLOBIN: 30.2 pg (ref 25.9–32.4)
MEAN CORPUSCULAR VOLUME: 92.5 fL (ref 77.6–95.7)
MEAN PLATELET VOLUME: 8.8 fL (ref 6.8–10.7)
MONOCYTES ABSOLUTE COUNT: 1.2 10*9/L — ABNORMAL HIGH (ref 0.3–0.8)
MONOCYTES RELATIVE PERCENT: 9.9 %
NEUTROPHILS ABSOLUTE COUNT: 9.4 10*9/L — ABNORMAL HIGH (ref 1.8–7.8)
NEUTROPHILS RELATIVE PERCENT: 80.1 %
NUCLEATED RED BLOOD CELLS: 0 /100{WBCs} (ref <=4)
PLATELET COUNT: 177 10*9/L (ref 150–450)
RED BLOOD CELL COUNT: 4.3 10*12/L (ref 3.95–5.13)
RED CELL DISTRIBUTION WIDTH: 14.9 % (ref 12.2–15.2)
WBC ADJUSTED: 11.7 10*9/L — ABNORMAL HIGH (ref 3.6–11.2)

## 2024-10-18 LAB — COMPREHENSIVE METABOLIC PANEL
ALBUMIN: 3.5 g/dL (ref 3.4–5.0)
ALKALINE PHOSPHATASE: 113 U/L (ref 46–116)
ALT (SGPT): <7 U/L — ABNORMAL LOW (ref 10–49)
ANION GAP: 18 mmol/L — ABNORMAL HIGH (ref 5–14)
AST (SGOT): 30 U/L (ref <=34)
BILIRUBIN TOTAL: 0.5 mg/dL (ref 0.3–1.2)
BLOOD UREA NITROGEN: 45 mg/dL — ABNORMAL HIGH (ref 9–23)
BUN / CREAT RATIO: 24
CALCIUM: 9.6 mg/dL (ref 8.7–10.4)
CHLORIDE: 99 mmol/L (ref 98–107)
CO2: 22.5 mmol/L (ref 20.0–31.0)
CREATININE: 1.86 mg/dL — ABNORMAL HIGH (ref 0.55–1.02)
EGFR CKD-EPI (2021) FEMALE: 27 mL/min/1.73m2 — ABNORMAL LOW (ref >=60)
GLUCOSE RANDOM: 156 mg/dL (ref 70–179)
POTASSIUM: 5.3 mmol/L — ABNORMAL HIGH (ref 3.4–4.8)
PROTEIN TOTAL: 8.3 g/dL — ABNORMAL HIGH (ref 5.7–8.2)
SODIUM: 139 mmol/L (ref 135–145)

## 2024-10-18 LAB — PROTIME-INR
INR: 1.04
PROTIME: 11.9 s (ref 9.9–12.6)

## 2024-10-18 LAB — PRO-BNP: PRO-BNP: 4681 pg/mL — ABNORMAL HIGH (ref <=300.0)

## 2024-10-18 LAB — HIGH SENSITIVITY TROPONIN I - SERIAL: HIGH SENSITIVITY TROPONIN I: 42 ng/L (ref <=34)

## 2024-10-18 NOTE — Unmapped (Signed)
 [facility]  Emergency Department Provider Note      ED Clinical Impression     Final diagnoses:   Hypoxia (Primary)   Acute on chronic congestive heart failure, unspecified heart failure type (CMS-HCC)   Fever, unspecified fever cause   Pneumonia due to infectious organism, unspecified laterality, unspecified part of lung       Initial Impression, ED Course, Assessment and Plan     Time seen: October 18, 2024 11:18 PM   Barbara Huber is a 83 y.o. female presenting with presenting with shortness of breath. The patient presents with acute on chronic hypoxic respiratory failure secondary to suspected volume overload from decompensated heart failure. Differential includes CHF exacerbation, pneumonia, COPD exacerbation, or ACS. Initial evaluation revealed bibasilar crackles on lung exam and chest X-ray showing pulmonary vascular congestion and interstitial edema consistent with fluid overload. Initial troponin was mildly elevated, likely due to demand ischemia in the setting of heart failure rather than acute coronary syndrome, though serial troponins were ordered to trend. EKG showed sinus rhythm without ischemic changes. BNP, basic metabolic panel, and CBC were ordered to assess for contributing factors.    Patient and daughter feel this is similar to hospitalization back in August. At that time the patient was admitted for 2 days and was started on IV lasix  160 mg (She is currently on toursemide 120 mg daily and has been taking 140 today).    The patient will be given IV diuretics with plan to reassess urine output, oxygen  requirements, and mobility after treatment. If symptoms and oxygenation improve with diuresis, discharge with close outpatient follow-up and possible diuresis clinic referral will be considered. If inadequate response or worsening respiratory status, admission for [name] and monitoring will be pursued. Case discussed with patient and family, who are in agreement with the plan.    12:28 AM  While in the emergency department the patient developed a fever to 102.6.  Given this the patient only started on antibiotics for coverage of community-acquired pneumonia.  Patient will also be admitted given oxygen  requirement in the setting.  Suspect there could be multifactorial component to that with the patient with possibly CHF exacerbation as well as respiratory infection.    Medical Decision Making  Amount and/or Complexity of Data Reviewed  Labs: ordered.  Radiology: ordered.  ECG/medicine tests: ordered.    Risk  OTC drugs.  Prescription drug management.        Social Drivers of Health with Concerns     Tobacco Use: Medium Risk (10/18/2024)    Patient History     Smoking Tobacco Use: Former     Smokeless Tobacco Use: Never     Passive Exposure: Not on file   Physical Activity: Unknown (11/04/2017)    Received from [facility]    Exercise Vital Sign     Days of Exercise per Week: Patient declined     Minutes of Exercise per Session: Patient declined   Stress: Unknown (11/04/2017)    Received from [facility] - Occupational Stress Questionnaire     Feeling of Stress : Patient declined   Social Connections: Unknown (11/04/2017)    Received from [facility]    Social Connection and Isolation Panel     Frequency of Communication with Friends and Family: Patient declined     Frequency of Social Gatherings with Friends and Family: Patient declined     Attends Religious Services:  Patient declined     Active Member of Clubs or Organizations: Patient declined     Attends Banker Meetings: Patient declined     Marital Status: Patient declined   Health Literacy: Medium Risk (10/16/2024)    Health Literacy     : Sometimes       ____________________________________________         History     Chief Complaint  Shortness of Breath      HPI   Barbara Huber is a 83 y.o. female with history of congestive heart failure/COPD on home oxygen  (3 L/min) at night time presented with worsening shortness of breath over the past day. The patient reports increased dyspnea on exertion and noted oxygen  saturations dropping to 88-89% at home, prompting an increase in her oxygen  to 5 L/min. She denies chest pain, fever, or recent illness but reports a mild cough without sputum production. She has been compliant with her home medications, including torsemide  and spironolactone , though she increased her torsemide  dose by 20 mg today due to feeling fluid-overloaded. She denies orthopnea beyond baseline, leg swelling, or paroxysmal nocturnal dyspnea. No nausea, vomiting, or urinary complaints. She has a known dry weight of approximately 164 lb (reports it was ~169 prior to the last hospitalization but they took her down to 164); she weighed 169 lb at home today.    Review of Systems:   Pertinent positives and negatives are documented as per the HPI    Physical Exam     VITAL SIGNS:    ED Triage Vitals [10/18/24 2133]   Enc Vitals Group      BP 128/83      Pulse 91      SpO2 Pulse 91      Resp 20      Temp 36.7 ??C (98 ??F)      Temp Source Oral      SpO2 93 %      Weight 75.8 kg (167 lb)      Height 1.6 m (5' 3)      Head Circumference       Peak Flow       Pain Score       Pain Loc       Pain Education       Exclude from Growth Chart        Constitutional: Alert and oriented. Well appearing and in no distress.  Eyes: Conjunctivae are normal. PERRL  ENT       Head: Normocephalic and atraumatic.       Nose: No congestion. [place] in place       Mouth/Throat: Mucous membranes are moist.       Neck: No stridor.  Cardiovascular: Normal rate, regular rhythm.  Respiratory: Mild increased respiratory effort. Bilateral basilar crackles.  Gastrointestinal: Soft, non tender, non distend, without rebound or guarding.  Musculoskeletal: No deformities.  Neurologic: Normal speech and language. No gross focal neurologic deficits are appreciated.  Skin: Skin is warm, dry and intact. No rash noted.    Pertinent labs & imaging results that were available during my care of the patient were reviewed by me and considered in my medical decision making (see chart for details).       Dann Arley Leech, MD  10/19/24 [phone]

## 2024-10-18 NOTE — Unmapped (Signed)
 Patient via WC to triage. Reports shortness of breath worse with ambulation starting tonight. Denies CP. Hx COPD and asthma. Wears 3L nasal cannula every day.

## 2024-10-19 ENCOUNTER — Ambulatory Visit
Admission: EM | Admit: 2024-10-19 | Discharge: 2024-10-20 | Disposition: A | Discharge status: Home Health | Payer: Medicare (Managed Care)

## 2024-10-19 ENCOUNTER — Inpatient Hospital Stay
Admission: EM | Admit: 2024-10-19 | Discharge: 2024-10-20 | Disposition: A | Discharge status: Home Health | Payer: Medicare (Managed Care)

## 2024-10-19 LAB — BASIC METABOLIC PANEL
ANION GAP: 15 mmol/L — ABNORMAL HIGH (ref 5–14)
BLOOD UREA NITROGEN: 55 mg/dL — ABNORMAL HIGH (ref 9–23)
BUN / CREAT RATIO: 26
CALCIUM: 8.8 mg/dL (ref 8.7–10.4)
CHLORIDE: 100 mmol/L (ref 98–107)
CO2: 23.5 mmol/L (ref 20.0–31.0)
CREATININE: 2.09 mg/dL — ABNORMAL HIGH (ref 0.55–1.02)
EGFR CKD-EPI (2021) FEMALE: 23 mL/min/1.73m2 — ABNORMAL LOW (ref >=60)
GLUCOSE RANDOM: 180 mg/dL — ABNORMAL HIGH (ref 70–179)
POTASSIUM: 4.6 mmol/L (ref 3.4–4.8)
SODIUM: 138 mmol/L (ref 135–145)

## 2024-10-19 LAB — HIGH SENSITIVITY TROPONIN I - SINGLE: HIGH SENSITIVITY TROPONIN I: 75 ng/L (ref <=34)

## 2024-10-19 LAB — URINALYSIS WITH MICROSCOPY WITH CULTURE REFLEX PERFORMABLE
BILIRUBIN UA: NEGATIVE
GLUCOSE UA: NEGATIVE
HYALINE CASTS: 4 /LPF — ABNORMAL HIGH (ref 0–1)
KETONES UA: NEGATIVE
NITRITE UA: NEGATIVE
PH UA: 5 (ref 5.0–9.0)
PROTEIN UA: NEGATIVE
RBC UA: 7 /HPF — ABNORMAL HIGH (ref <=4)
SPECIFIC GRAVITY UA: 1.01 (ref 1.003–1.030)
SQUAMOUS EPITHELIAL: 1 /HPF (ref 0–5)
UROBILINOGEN UA: <2
WBC UA: 4 /HPF (ref 0–5)

## 2024-10-19 LAB — MAGNESIUM: MAGNESIUM: 2.4 mg/dL (ref 1.6–2.6)

## 2024-10-19 LAB — CBC
HEMATOCRIT: 33.7 % — ABNORMAL LOW (ref 34.0–44.0)
HEMOGLOBIN: 11.2 g/dL — ABNORMAL LOW (ref 11.3–14.9)
MEAN CORPUSCULAR HEMOGLOBIN CONC: 33.2 g/dL (ref 32.0–36.0)
MEAN CORPUSCULAR HEMOGLOBIN: 30.5 pg (ref 25.9–32.4)
MEAN CORPUSCULAR VOLUME: 91.9 fL (ref 77.6–95.7)
MEAN PLATELET VOLUME: 9 fL (ref 6.8–10.7)
PLATELET COUNT: 151 10*9/L (ref 150–450)
RED BLOOD CELL COUNT: 3.67 10*12/L — ABNORMAL LOW (ref 3.95–5.13)
RED CELL DISTRIBUTION WIDTH: 14.6 % (ref 12.2–15.2)
WBC ADJUSTED: 14.7 10*9/L — ABNORMAL HIGH (ref 3.6–11.2)

## 2024-10-19 LAB — HIGH SENSITIVITY TROPONIN I - 2 HOUR SERIAL
HIGH SENSITIVITY TROPONIN - DELTA (0-2H): 7 ng/L (ref <=7)
HIGH-SENSITIVITY TROPONIN I - 2 HOUR: 49 ng/L (ref <=34)

## 2024-10-19 MED ADMIN — amiodarone (PACERONE) tablet 200 mg: 200 mg | ORAL | @ 13:00:00

## 2024-10-19 MED ADMIN — furosemide (LASIX) 100 mg in sodium chloride (NS) 0.9 % 50 mL IVPB: 100 mg | INTRAVENOUS | @ 16:00:00 | Stop: 2024-10-19

## 2024-10-19 MED ADMIN — metOLazone (ZAROXOLYN) tablet 5 mg: 5 mg | ORAL | @ 04:00:00 | Stop: 2024-10-19

## 2024-10-19 MED ADMIN — fluticasone furoate-vilanterol (BREO ELLIPTA) 100-25 mcg/dose inhaler 1 puff: 1 | RESPIRATORY_TRACT | @ 13:00:00

## 2024-10-19 MED ADMIN — midodrine (PROAMATINE) tablet 5 mg: 5 mg | ORAL | @ 18:00:00

## 2024-10-19 MED ADMIN — umeclidinium (INCRUSE ELLIPTA) 62.5 mcg/actuation inhaler 1 puff: 1 | RESPIRATORY_TRACT | @ 13:00:00

## 2024-10-19 MED ADMIN — gabapentin (NEURONTIN) capsule 100 mg: 100 mg | ORAL | @ 06:00:00

## 2024-10-19 MED ADMIN — acetaminophen (TYLENOL) tablet 650 mg: 650 mg | ORAL | @ 15:00:00

## 2024-10-19 MED ADMIN — tafamidis cap 61 mg: 61 mg | ORAL | @ 13:00:00

## 2024-10-19 MED ADMIN — furosemide (LASIX) 160 mg in sodium chloride (NS) 0.9 % 50 mL IVPB: 160 mg | INTRAVENOUS | @ 04:00:00 | Stop: 2024-10-19

## 2024-10-19 MED ADMIN — gabapentin (NEURONTIN) capsule 100 mg: 100 mg | ORAL | @ 13:00:00

## 2024-10-19 MED ADMIN — heparin (porcine) 5,000 unit/mL injection 5,000 Units: 5000 [IU] | SUBCUTANEOUS | @ 11:00:00

## 2024-10-19 MED ADMIN — oxyCODONE (ROXICODONE) immediate release tablet 5 mg: 5 mg | ORAL | @ 15:00:00 | Stop: 2024-10-26

## 2024-10-19 MED ADMIN — cefTRIAXone (ROCEPHIN) 1 g in sodium chloride 0.9 % (NS) 100 mL IVPB-MBP: 1 g | INTRAVENOUS | @ 05:00:00 | Stop: 2024-10-19

## 2024-10-19 MED ADMIN — ferrous sulfate tablet 325 mg: 325 mg | ORAL | @ 13:00:00

## 2024-10-19 MED ADMIN — midodrine (PROAMATINE) tablet 5 mg: 5 mg | ORAL | @ 13:00:00

## 2024-10-19 MED ADMIN — doxycycline (VIBRA-TABS) tablet 100 mg: 100 mg | ORAL | @ 04:00:00 | Stop: 2024-10-19

## 2024-10-19 MED ADMIN — midodrine (PROAMATINE) tablet 5 mg: 5 mg | ORAL | @ 06:00:00 | Stop: 2024-10-19

## 2024-10-19 MED ADMIN — doxycycline (VIBRA-TABS) tablet 100 mg: 100 mg | ORAL | @ 21:00:00 | Stop: 2024-10-23

## 2024-10-19 MED ADMIN — heparin (porcine) 5,000 unit/mL injection 5,000 Units: 5000 [IU] | SUBCUTANEOUS | @ 18:00:00

## 2024-10-19 MED ADMIN — acetaminophen (TYLENOL) tablet 650 mg: 650 mg | ORAL | @ 04:00:00 | Stop: 2024-10-19

## 2024-10-19 MED ADMIN — pantoprazole (Protonix) EC tablet 20 mg: 20 mg | ORAL | @ 13:00:00

## 2024-10-19 MED ADMIN — midodrine (PROAMATINE) tablet 5 mg: 5 mg | ORAL | @ 21:00:00

## 2024-10-19 NOTE — Unmapped (Signed)
 Daily Progress Note    Assessment/Plan:    Principal Problem:    Acute on chronic heart failure with preserved ejection fraction ([name]) (CMS-HCC)  Active Problems:    COPD (chronic obstructive pulmonary disease) (CMS-HCC)    Essential hypertension    Chronic respiratory failure with hypoxia    (CMS-HCC)    Atrial fibrillation    (CMS-HCC)    Anemia in chronic kidney disease    Chronic kidney disease ([name]), stage IV (severe)    (CMS-HCC)    Chronic prescription opiate use    Cardiac amyloidosis (CMS-HCC)    Pneumonia due to infectious organism        Wound 07/30/24 Pressure Injury Coccyx (Active)           Barbara Huber is a 83 y.o. female who is presenting to [facility] with Acute on chronic heart failure with preserved ejection fraction ([name]) (CMS-HCC), in the setting of the following pertinent/contributing co-morbidities: [name] 2/2 cardiac amyloidosis, [name] IV, HTN, afib, COPD on 3L [place] at baseline .        Acute on chronic heart failure with preserved ejection fraction ([name])- Community acquired pneumonia in setting of chronic respiratory failure on 3L [place] at baseline  Patient's shortness of breath likely multifactorial with contributions from volume overload from [name] exacerbation, pneumonia (as patient with fever in ED), and chronic hypoxic respiratory failure from COPD.   - Lasix  100 mg IV daily x 2 days.  Metolazone  2.5 mg as needed to augment net output. Goal net negative 1.5 L.  Received 160 mg IV lasix  in ED. Home dose 120 mg po   - daily standing weights and strict I&O  - continue ceftriaxone  and doxycycline  for CAP coverage  - RRP negative.  Urine culture pending. Will send blood culture if febrile  - continue home midodrine  TID   - resume home spironolactone  given resolution of hyperkalemia  - continue home tafamidis  (may need to bring from home)  - cardiology consulted and appreciate input  - stepdown unit     Secondary/Additional Active Problems:  Afib (not currently on anticoagulation):  - cont home amiodarone      COPD on 3L [place] (chronic hypoxic respiratory failure):  - formulary sub for home advair  and spiriva      GERD:   - cont PPI     Chronic pain:  - cont home oxycodone  and gabapentin      Hyperkalemia:   - resolved     [name] IV: baseline creatinine appears to fluctuate but likely around 1.55-2.2. Creatinine on admission 1.86  - CTM  - avoid nephrotoxic agents as able     Prophylaxis  -subcutaneous heparin      Diet  -Nutrition Therapy Regular/House     Code Status / HCDM  -Full Code, Discussed with patient at the time of admission   -  HCDM (With Legal Document To Support): [name],[name] - Daughter - [phone]    I personally spent 55 minutes face-to-face and non-face-to-face in the care of this patient, which includes all pre, intra, and post visit E/M time on the date of service. All documented time was specific to the E/M visit and does not include any pre, intra, post procedure related time.  ___________________________________________________________________    Subjective:  No acute events overnight.  Reports dyspnea at baseline but desaturations with exertion.  Good urine output.  Cough but no fever since admission.  No cp    Labs/Studies:  Recent Results (from the past 24 hours)   ECG 12 Lead  Collection Time: 10/18/24  9:32 PM   Result Value Ref Range    EKG Systolic BP  mmHg    EKG Diastolic BP  mmHg    EKG Ventricular Rate 92 BPM    EKG Atrial Rate 100 BPM    EKG P-R Interval  ms    EKG QRS Duration 114 ms    EKG Q-T Interval 364 ms    EKG QTC Calculation 450 ms    EKG Calculated P Axis  degrees    EKG Calculated R Axis 203 degrees    EKG Calculated T Axis 66 degrees    QTC Fredericia 419 ms   Comprehensive Metabolic Panel    Collection Time: 10/18/24  9:47 PM   Result Value Ref Range    Sodium 139 135 - 145 mmol/L    Potassium 5.3 (H) 3.4 - 4.8 mmol/L    Chloride 99 98 - 107 mmol/L    CO2 22.5 20.0 - 31.0 mmol/L    Anion Gap 18 (H) 5 - 14 mmol/L    BUN 45 (H) 9 - 23 mg/dL    Creatinine 8.13 (H) 0.55 - 1.02 mg/dL    BUN/Creatinine Ratio 24     eGFR [name]-EPI (2021) Female 27 (L) >=60 mL/min/1.2m2    Glucose 156 70 - 179 mg/dL    Calcium  9.6 8.7 - 10.4 mg/dL    Albumin 3.5 3.4 - 5.0 g/dL    Total Protein 8.3 (H) 5.7 - 8.2 g/dL    Total Bilirubin 0.5 0.3 - 1.2 mg/dL    AST 30 <=65 U/L    ALT <7 (L) 10 - 49 U/L    Alkaline Phosphatase 113 46 - 116 U/L   PT-INR    Collection Time: 10/18/24  9:47 PM   Result Value Ref Range    PT 11.9 9.9 - 12.6 sec    INR 1.04    hsTroponin I (serial 0-2-6H w/ delta)    Collection Time: 10/18/24  9:47 PM   Result Value Ref Range    hsTroponin I 42 (HH) <=34 ng/L   CBC w/ Differential    Collection Time: 10/18/24  9:47 PM   Result Value Ref Range    WBC 11.7 (H) 3.6 - 11.2 10*9/L    RBC 4.30 3.95 - 5.13 10*12/L    HGB 13.0 11.3 - 14.9 g/dL    HCT 60.1 65.9 - 55.9 %    MCV 92.5 77.6 - 95.7 fL    MCH 30.2 25.9 - 32.4 pg    MCHC 32.6 32.0 - 36.0 g/dL    RDW 85.0 87.7 - 84.7 %    MPV 8.8 6.8 - 10.7 fL    Platelet 177 150 - 450 10*9/L    nRBC 0 <=4 /100 WBCs    Neutrophils % 80.1 %    Lymphocytes % 8.4 %    Monocytes % 9.9 %    Eosinophils % 1.2 %    Basophils % 0.4 %    Absolute Neutrophils 9.4 (H) 1.8 - 7.8 10*9/L    Absolute Lymphocytes 1.0 (L) 1.1 - 3.6 10*9/L    Absolute Monocytes 1.2 (H) 0.3 - 0.8 10*9/L    Absolute Eosinophils 0.1 0.0 - 0.5 10*9/L    Absolute Basophils 0.0 0.0 - 0.1 10*9/L   RAPID INFLUENZA/RSV/COVID PCR    Collection Time: 10/18/24  9:47 PM    Specimen: Nasopharyngeal Swab   Result Value Ref Range    SARS-CoV-2 PCR Negative Negative    Influenza  A Negative Negative    Influenza B Negative Negative    RSV Negative Negative   Pro-BNP    Collection Time: 10/18/24  9:47 PM   Result Value Ref Range    PRO-BNP 4,681.0 (H) <=300.0 pg/mL   hsTroponin I - 2 Hour    Collection Time: 10/18/24 11:45 PM   Result Value Ref Range    hsTroponin I 49 (HH) <=34 ng/L    delta hsTroponin I 7 <=7 ng/L   Urinalysis with Microscopy with Culture Reflex    Collection Time: 10/19/24 12:35 AM   Result Value Ref Range    Color, UA Colorless     Clarity, UA Clear     Specific Gravity, UA 1.010 1.003 - 1.030    pH, UA 5.0 5.0 - 9.0    Leukocyte Esterase, UA Moderate (A) Negative    Nitrite, UA Negative Negative    Protein, UA Negative Negative    Glucose, UA Negative Negative    Ketones, UA Negative Negative    Urobilinogen, UA <2.0 mg/dL <7.9 mg/dL    Bilirubin, UA Negative Negative    Blood, UA Trace (A) Negative    RBC, UA 7 (H) <=4 /HPF    WBC, UA 4 0 - 5 /HPF    Squam Epithel, UA 1 0 - 5 /HPF    Bacteria, UA Moderate (A) None Seen /HPF    Hyaline Casts, UA 4 (H) 0 - 1 /LPF    Mucus, UA Rare (A) None Seen /HPF   ECG 12 Lead    Collection Time: 10/19/24  1:02 AM   Result Value Ref Range    EKG Systolic BP  mmHg    EKG Diastolic BP  mmHg    EKG Ventricular Rate 83 BPM    EKG Atrial Rate  BPM    EKG P-R Interval  ms    EKG QRS Duration 120 ms    EKG Q-T Interval 424 ms    EKG QTC Calculation 498 ms    EKG Calculated P Axis  degrees    EKG Calculated R Axis 268 degrees    EKG Calculated T Axis 67 degrees    QTC Fredericia 472 ms   CBC    Collection Time: 10/19/24  4:16 AM   Result Value Ref Range    WBC 14.7 (H) 3.6 - 11.2 10*9/L    RBC 3.67 (L) 3.95 - 5.13 10*12/L    HGB 11.2 (L) 11.3 - 14.9 g/dL    HCT 66.2 (L) 65.9 - 44.0 %    MCV 91.9 77.6 - 95.7 fL    MCH 30.5 25.9 - 32.4 pg    MCHC 33.2 32.0 - 36.0 g/dL    RDW 85.3 87.7 - 84.7 %    MPV 9.0 6.8 - 10.7 fL    Platelet 151 150 - 450 10*9/L   Basic metabolic panel    Collection Time: 10/19/24  4:16 AM   Result Value Ref Range    Sodium 138 135 - 145 mmol/L    Potassium 4.6 3.4 - 4.8 mmol/L    Chloride 100 98 - 107 mmol/L    CO2 23.5 20.0 - 31.0 mmol/L    Anion Gap 15 (H) 5 - 14 mmol/L    BUN 55 (H) 9 - 23 mg/dL    Creatinine 7.90 (H) 0.55 - 1.02 mg/dL    BUN/Creatinine Ratio 26     eGFR [name]-EPI (2021) Female 23 (L) >=60 mL/min/1.53m2    Glucose 180 (H)  70 - 179 mg/dL    Calcium  8.8 8.7 - 10.4 mg/dL   Magnesium  Level    Collection Time: 10/19/24  4:16 AM   Result Value Ref Range    Magnesium  2.4 1.6 - 2.6 mg/dL   hsTroponin I (single, no delta)    Collection Time: 10/19/24  4:16 AM   Result Value Ref Range    hsTroponin I 75 (HH) <=34 ng/L   ECG 12 Lead    Collection Time: 10/19/24  7:21 AM   Result Value Ref Range    EKG Systolic BP  mmHg    EKG Diastolic BP  mmHg    EKG Ventricular Rate 68 BPM    EKG Atrial Rate 68 BPM    EKG P-R Interval 216 ms    EKG QRS Duration 118 ms    EKG Q-T Interval 486 ms    EKG QTC Calculation 516 ms    EKG Calculated P Axis 134 degrees    EKG Calculated R Axis -57 degrees    EKG Calculated T Axis 116 degrees    QTC Fredericia 507 ms   Respiratory Pathogen Panel    Collection Time: 10/19/24  8:42 AM   Result Value Ref Range    Adenovirus Not Detected Not Detected    Coronavirus HKU1 Not Detected Not Detected    Coronavirus NL63 Not Detected Not Detected    Coronavirus 229E Not Detected Not Detected    Coronavirus OC43 PCR Not Detected Not Detected    Metapneumovirus Not Detected Not Detected    Rhinovirus/Enterovirus Not Detected Not Detected    Influenza A Not Detected Not Detected    Influenza B Not Detected Not Detected    Parainfluenza 1 Not Detected Not Detected    Parainfluenza 2 Not Detected Not Detected    Parainfluenza 3 Not Detected Not Detected    Parainfluenza 4 Not Detected Not Detected    RSV Not Detected Not Detected    Chlamydophila (Chlamydia) pneumoniae Not Detected Not Detected    Mycoplasma pneumoniae Not Detected Not Detected    SARS-CoV-2 PCR Not Detected Not Detected     Objective:  Temp:  [36.4 ??C (97.5 ??F)-39.2 ??C (102.6 ??F)] 36.8 ??C (98.2 ??F)  Pulse:  [66-91] 67  SpO2 Pulse:  [66-91] 66  Resp:  [14-23] 23  BP: (88-133)/(28-114) 113/44  FiO2 (%):  [3 %] 3 %  SpO2:  [90 %-98 %] 97 %    GEN: pleasant but chronically ill appearing woman in NAD, lying in bed  HEENT: EOMI MMM  CV: RRR. No murmur appreciated  PULM: Rhonchi throughout with distant breath sounds. Moving air well.  No wheezing  ABD: soft, NT/ND, +BS  EXT: 1+ edema  NEURO: alert and oriented x 3.  No focal neuro deficits.

## 2024-10-19 NOTE — Unmapped (Signed)
 PHYSICAL THERAPY  Evaluation (10/19/24 1302)          Patient Name:  Barbara Huber       Medical Record Number: [id number]   Date of Birth: June 03, 1941  Sex: Female        Post-Discharge Physical Therapy Recommendations:  PT Post Acute Discharge Recommendations: 3x weekly   Equipment Recommendation  PT DME Recommendations: None          Treatment Diagnosis: Generalized muscle weakness, Unsteadiness on feet        ASSESSMENT  Problem List: Decreased strength, Impaired balance, Decreased mobility, Fall risk, Decreased endurance, Shortness of breath      Assessment : 'Barbara Huber is a 83 y.o. 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 Pt has 24/7 assistance at home and recieves assistance as needed for ADLs/IADLs. Pt presents to acute PT services near baseline, requiring MinA for supine<>sit, and SBA/CGA with rollator for sit<>stand and ambulation. Pt most limited this date by fatigue and decreased endurance (see vitals). Co-eval completed due to pt's decreased activity tolerance. Pt will continue to benefit from acute PT services to progress endurance as well as 3x post-acute needs.      Today's Interventions: PT Eval, bed mobility, transfers, ambulation, toileting/pericare, vitals monitoring, edu re: PT role, POC, safety     Personal Factors/Comorbidities Present: 3+ factors   Examination of Body systems: 4+ elements  Clinical Presentation: Evolving    Eval Complexity : Moderate Complexity     Activity Tolerance: Tolerated treatment well, Limited by fatigue       PLAN  Planned Frequency of Treatment: Plan of Care Initiated: 10/19/24  1-2x per day Weekly Frequency: 2-3 days per week  Planned Treatment Duration: 11/02/24     Planned Interventions: Education (Patient/Family/Caregiver), Self-care / Home Management training, Gait training, Therapeutic Exercise, Therapeutic Activity     Goals:   Patient and Family Goals: To go home     SHORT GOAL #1: Pt will perform supine<>sit with Mod-I               Time Frame : 2 weeks  SHORT GOAL #2: Pt will perform sit<>stand with Mod-I and LRAD              Time Frame : 2 weeks  SHORT GOAL #3: Pt will ambulate >173ft with Mod-I, LRAD and SpO2 >90%              Time Frame : 2 weeks                                Prognosis:  Fair     Barriers to Discharge: Endurance deficits, Functional strength deficits     SUBJECTIVE        Patient reports: Agreeable to session with encouragement I didn't get any sleep last night        Services patient receives prior to admission: PT, OT  Prior Functional Status: Pt reports PLOF as Mod-I with rollator for short distances (<6ft) in the home; W/C for longer distances.  Has 24/7 A from family for ADL.  Uses 3.0L supplemental oxygen   Living Situation  Living Environment: House  Lives With: Daughter, Family  Home Living: One level home, Walk-in shower, Grab bars in shower, Built-in shower seat, Shower chair with back, Ramped entrance, Bedside commode, Handicapped height toilet  Caregiver Identified?: Yes  Caregiver Availability: 24 hours  Caregiver Ability: Supervision  Caregiver Identified?: Yes   Equipment available at home: Rollator, Bedside commode        Past Medical History[1]         Social History     Tobacco Use    Smoking status: Former     Current packs/day: 0.00     Types: Cigarettes     Quit date: 2019     Years since quitting: 6.8    Smokeless tobacco: Never    Tobacco comments:     Quit a few years ago   Substance Use Topics    Alcohol use: No       Past Surgical History[2]          Family History[3]     Allergies: Nitrofurantoin and Lipitor  [atorvastatin ]                  Objective Findings  Precautions / Restrictions  Precautions: Falls precautions  Weight Bearing Status: Non-applicable  Required Braces or Orthoses: Non-applicable        Equipment / Environment: Vascular access (PIV, TLC, Port-a-cath, PICC), Telemetry, Supplemental oxygen      Vitals/Orthostatics : SpO2 high 90s at rest on 3L, decreased to 86% with ambulation and did not increase with seated rest and PLB requiring titration to 4L     Cognition: WFL  Orientation: Oriented x4  Visual/Perception: Wears Glasses/Contacts all the time  Hearing: No deficit identified     Skin Inspection: Intact where visualized     Upper Extremities  UE ROM: Right WFL, Left WFL  UE Strength: Right WFL, Left WFL    Lower Extremities  LE ROM: Right WFL, Left WFL  LE Strength: Right WFL, Left WFL          Coordination: Not tested  Proprioception: Not tested  Sensation: WFL  Posture: Kyphotic    Static Sitting-Level of Assistance: Independent  Dynamic Sitting-Level of Assistance: Archivist Standing-Level of Assistance: Supervision  Dynamic Standing - Level of Assistance: Supervision      Bed Mobility comments: supine>sit CGA; sit>supine ModA for LE management     Transfer comments: sit<>stand with rollator and SBA/CGA with cues to lock/unlock breaks of rollator      Gait Level of Assistance: Standby assist, set-up cues, supervision of patient - no hands on, Contact guard assist, steadying assist  Gait Assistive Device: Four wheel walker  Gait Distance Ambulated (ft): 80 ft  Skilled Treatment Performed: Pt ambulated 62ft with rollator and SBA with forward flexed posture and slow, steady reciprocal cadence. Required CGA for turns and retro walking     Stairs: NA- ramped entrance            Endurance: Poor    Patient at end of session: All needs in reach, Friends/Family present, In bed, Lines intact    Physical Therapy Session Duration  PT Co-Treatment [mins]: 20 (With Gerard Lunger, OT)  Reason for Co-treatment: Poor activity tolerance          AM-PAC 5 click  Help currently need turning over In bed?: None - Modified Independent/Independent  Help currently needed sitting down/standing up from chair with arms? : A Little - Minimal/Contact Guard Assist/Supervision  Help currently needed moving from supine to sitting on edge  of bed?: A Little - Minimal/Contact Guard Assist/Supervision  Help currently needed moving to and from bed from wheelchair?: A Little - Minimal/Contact Guard Assist/Supervision  Help currently needed walking in a hospital room?: A Little - Minimal/Contact Guard Assist/Supervision      Basic Mobility Score 5 click: 16    Score (in points): % of Functional Impairment, Limitation, Restriction  5: 100% impaired, limited, restricted  6-7: At least 80%, but less than 100% impaired, limited restricted  8-11: At least 60%, but less than 80% impaired, limited restricted  12-16: At least 40%, but less than 60% impaired, limited restricted  17-18: At least 20%, but less than 40% impaired, limited restricted  19: At least 1%, but less than 20% impaired, limited restricted  20: 0% impaired, limited restricted    'AM-PAC' forms are Copyright protected by The [employer] of [facility]         I attest that I have reviewed the above information.  Signed: Camie Blew, PT  Filed 10/19/2024          [1]   Past Medical History:  Diagnosis Date    Acute exacerbation of CHF (congestive heart failure) (CMS-HCC) 08/19/2023    Acute kidney injury superimposed on chronic kidney disease 10/11/2020    Acute on chronic diastolic (congestive) heart failure (CMS-HCC) 08/23/2020    Acute on chronic diastolic congestive heart failure (CMS-HCC) 08/23/2020    AKI (acute kidney injury) 04/14/2015    Lab Results  Component  Value  Date     CREATININE  1.90 (H)  06/12/2021     Had a bump in her creatinine when she was taking her diuretics every day.  She is currently taking 40 mg daily of torsemide  and 50 mg of spironolactone .  Her volume status is fragile.  Previously when she stopped her diuretic she becomes short of breath.  Plan: We will check her BMP today.  We will likely have to go to 40    Anemia 08/22/2019    Iron  deficiency anemia          Lab Results      Component    Value    Date           WBC    9.2    03/21/2021           RBC    3.67 (L)    03/21/2021           HGB    9.9 (L)    03/21/2021           HCT    30.7 (L)    03/21/2021           MCV    83.7    03/21/2021           MCH    26.9    03/21/2021           MCHC    32.1    03/21/2021           RDW    21.9 (H)    03/21/2021           PLT    318     Arthritis     Calculus of kidney     Calculus of ureter     CHF (congestive heart failure) (CMS-HCC)     Chronic atrial fibrillation    (CMS-HCC) 07/20/2019    COPD (chronic obstructive pulmonary disease) (CMS-HCC)     Diabetes (CMS-HCC)  Gangrenous cholecystitis 10/11/2020    Generalized edema  06/17/2021    GERD (gastroesophageal reflux disease)     Hydronephrosis     Hypertension     Hyponatremia 10/11/2020    Intermediate coronary syndrome (CMS-HCC) 03/13/2014    Lower extremity edema 09/28/2020    Lumbar stenosis     Microscopic hematuria     Nausea alone     Nephrolithiasis 04/17/2016    Neuropathy     Nocturia     Nocturia 07/01/2017    Other chronic cystitis     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  See plans for anemia, AKI  We are also tapering her gabapentin .  Currently on 300 mg 3 times daily.  We will decrease it to twice daily, and then nightly.  She states she is not having any recurrence of her pain.    Pulmonary hypertension    (CMS-HCC)     Renal colic     Shortness of breath 04/28/2020    Sleep apnea     Unstable angina pectoris    (CMS-HCC) 03/13/2014   [2]   Past Surgical History:  Procedure Laterality Date    BACK SURGERY  1995    CARPAL TUNNEL RELEASE Left 2014    HYSTERECTOMY  1971    IR INSERT CHOLECYSTOSTOMY TUBE PERCUTANEOUS  10/02/2020    IR INSERT CHOLECYSTOSMY TUBE PERCUTANEOUS 10/02/2020 Ivonne Butler Seed, MD IMG [facility]IR [facility]    LUMBAR DISC SURGERY      PR REMOVAL GALLBLADDER N/A 10/06/2020    Procedure: CHOLECYSTECTOMY;  Surgeon: Curtistine Mira Dunnings, MD;  Location: MAIN OR [facility];  Service: Trauma    PR RIGHT HEART CATH O2 SATURATION & CARDIAC OUTPUT N/A 09/30/2020    Procedure: Right Heart Catheterization;  Surgeon: Reyes Jerilynn Sage, MD;  Location: [facility] CATH;  Service: Cardiology    PR RIGHT HEART CATH O2 SATURATION & CARDIAC OUTPUT N/A 01/09/2021    Procedure: Right Heart Catheterization;  Surgeon: Norleen Deward Grumbling, MD;  Location: [facility] CATH;  Service: Cardiology   [3]   Family History  Problem Relation Age of Onset    Hypertension Mother     Cancer Father         COLON CANCER    No Known Problems Sister     No Known Problems Sister     No Known Problems Brother     No Known Problems Brother     No Known Problems Maternal Aunt     No Known Problems Maternal Uncle     No Known Problems Paternal Aunt     No Known Problems Paternal Uncle     No Known Problems Maternal Grandmother     No Known Problems Maternal Grandfather     No Known Problems Paternal Grandmother     No Known Problems Paternal Grandfather     No Known Problems Other     Anesthesia problems Neg Hx     Broken bones Neg Hx     Clotting disorder Neg Hx     Collagen disease Neg Hx     Diabetes Neg Hx     Dislocations Neg Hx     Fibromyalgia Neg Hx     Gout Neg Hx     Hemophilia Neg Hx     Osteoporosis Neg Hx     Rheumatologic disease Neg Hx     Scoliosis Neg Hx     Severe sprains Neg Hx  Sickle cell anemia Neg Hx     Spinal Compression Fracture Neg Hx     GU problems Neg Hx     Kidney cancer Neg Hx     Prostate cancer Neg Hx

## 2024-10-19 NOTE — Unmapped (Signed)
 Shift Summary  Pain increased to 8/10 late morning and was managed with PRN medications, returning to 0/10 by afternoon.   Fall prevention measures were maintained throughout the shift, including scheduled toileting, hourly checks, and use of nonskid footwear.   Respiratory pathogen panel was negative and temperature remained stable.   Patient participated in therapy and education interventions and expressed readiness for discharge planning.   Overall, comfort and safety goals were met and patient remained engaged in care.     Optimal Comfort and Wellbeing: Pain increased to 8 mid-shift but returned to 0 by the end of the shift after administration of oxyCODONE  and acetaminophen ; patient also reported no pain and participated in OT session despite mild shortness of breath. Sleep was poor last night, but patient was agreeable to therapy and education interventions throughout the day.     Absence of Fall and Fall-Related Injury: Fall prevention strategies were consistently maintained, including hourly visual checks, scheduled toileting, bed safety rails, and use of nonskid footwear; no falls or injuries occurred during the shift.     Absence of Infection Signs and Symptoms: Temperature remained within normal limits and respiratory pathogen panel was negative for all tested viruses; sepsis risk score fluctuated but trended lower for most of the shift.

## 2024-10-19 NOTE — Unmapped (Signed)
 OCCUPATIONAL THERAPY  Evaluation (10/19/24 1301)    Patient Name:  Barbara Huber       Medical Record Number: [id number]     Date of Birth: 10-Sep-1941  Sex: Female      Post-Discharge Occupational Therapy Recommendations: 3x weekly (with 24/7 assist)          Equipment Recommendation  OT DME Recommendations: None       OT Treatment Diagnosis: Need for assistance with personal care         Assessment  Assessment: Barbara Huber is a 83 y.o. female who is presenting to 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 Pt with recent admission ~3 months prior, DC home with [facility] and family assist and 24/7 caregivers. At baseline pt requires assist for ADLs and completes functional mobility using rollator household distances. No recent falls. Daughter present during OT eval. Therapist educated pt on role of OT, OT POC, and safety during functional transfers. Pt is currently functioning below baseline, requiring min A for bed mobility, CGA for functional transfers using RW, CGA for functional mobility x43feet x2 trials, min A for  toileting, and max/total A for LE dressing due to demonstrated deficits including decreased endurance, decreased strength, and increased SOB decreasing independence. O2 sat decreased with mobility requiring increased supplemental O2, see vitals section. Skilled acute care OT recommended to address these deficits and maximize independence. Recommend 3x with 24/7 assist at DC. After review of the patient's occupational profile and history, assessment of occupational performance, clinical decision making, and development of POC, the patient presents as a high complexity case. Based on the daily activity AM-PAC raw score of 16/24, the patient is considered to be 53.3% impaired care.    Problem List: Decreased strength, Decreased range of motion, Decreased activity tolerance, Decreased endurance, Decreased mobility, Impaired ADLs, Impaired balance  Personal Factors/Comorbidities (Occupational Profile and History Review): Extensive (High)  Assessment of Occupational Performance : Balance, Dexterity, Endurance, Fine or gross motor coordination, Mobility, Sensation, Strength  Clinical Decision Making: High Complexity    Today's Interventions: ADL retraining, Balance activities, Compensatory tech. training, Conservation, Education - Patient, Bed mobility, Endurance activities, Functional mobility, Range of motion, Positioning, Transfer training, Safety education, Education - Family / caregiver  Today's Interventions: endurance training, strength training, balance training, functional transfers, functional mobility , pt education, toileting, toilet transfer    Activity Tolerance During Today's Session  Tolerated treatment well    Plan  Planned Frequency of Treatment: Plan of Care Initiated: 10/19/24  1-2x per day Weekly Frequency: 2-3 days per week  Planned Treatment Duration: 10/26/24    Planned Interventions:  Education (Patient/Family/Caregiver), Self-Care/Home Training, Therapeutic Exercise, Therapeutic Activity, Cognitive Skills Development      GOALS:   Patient and Family Goals: return home    Short Term:   SHORT GOAL #1: Pt will compete toilet transfer and toileting tasks with supervision using LRD   Time Frame : 1 week  SHORT GOAL #2: Pt will complete UE ADL with supervision using LRD   Time Frame : 1 week  SHORT GOAL #3: Pt will complete standing grooming task x3 mins using LRD and supervision   Time Frame : 1 week           Long Term Goal #1: Pt will maximize independence with self care tasks in 8 weeks  Time Frame:  2 months    Prognosis:  Good  Positive Indicators:  caregiver assist  Barriers to Discharge: Endurance deficits, Functional strength deficits    Subjective  Medical Updates Since Last Visit/Relevant PMH Affecting Clinical Decision Making:    Prior Functional Status Pt and dtr reports she requires max A for bathing and dressing and min A for grooming. Pt uses a rollator with S-SBA from family. Pt has 24/7S/A.    Living Situation  Living Environment: House  Lives With: Daughter, Family  Home Living: One level home, Walk-in shower, Grab bars in shower, Built-in shower seat, Shower chair with back, Ramped entrance, Bedside commode, Handicapped height toilet  Caregiver Identified?: Yes  Caregiver Availability: 24 hours  Caregiver Ability: Supervision     Equipment available at home: Rollator, Bedside commode            Patient / Caregiver reports: I feel a little short of breath pt agreeable to OT session      Past Medical History[1] Social History     Tobacco Use    Smoking status: Former     Current packs/day: 0.00     Types: Cigarettes     Quit date: 2019     Years since quitting: 6.8    Smokeless tobacco: Never    Tobacco comments:     Quit a few years ago   Substance Use Topics    Alcohol use: No      Past Surgical History[2] Family History[3]     Nitrofurantoin and Lipitor  [atorvastatin ]     Objective Findings  Precautions / Restrictions  Falls precautions       Weight Bearing  Non-applicable    Required Braces or Orthoses  Non-applicable    Communication Preference  Verbal       Pain  pt reported no pain    Equipment / Environment  Vascular access (PIV, TLC, Port-a-cath, PICC), Telemetry, Supplemental oxygen          Cognition   Orientation Level:  Oriented x 4   Arousal/Alertness:  Appropriate responses to stimuli   Attention Span:  Appears intact   Memory:  Appropriate for age   Following Commands:  Follows all commands without difficulty   Safety Judgment:  Good awareness of safety precautions   Awareness of Errors and Problem Solving:  Able to problem solve independently   Comments:      Vision / Hearing   Vision: No acute deficits identified, Glasses present, Wears glasses all the time     Hearing: No deficit identified         Hand Function:  Right Hand Function: Right hand grip strength, ROM and coordination WNL  Left Hand Function: Left hand grip strength, ROM and coordination WNL  Hand Dominance: Right    Skin Inspection:  Skin Inspection: Intact where visualized    Face/Cervical ROM:  Face ROM: WFL  Cervical ROM: WFL    ROM / Strength:  UE ROM/Strength: Left WFL, Right WFL  LE ROM/Strength: Left Impaired/Limited, Right Impaired/Limited  RLE Impairment: Reduced strength  LLE Impairment: Reduced strength    Coordination:  Coordination: WFL    Sensation:  RUE Sensation: RUE intact  LUE Sensation: LUE intact  RLE Sensation: RLE intact  LLE Sensation: LLE intact    Balance:  Static Sitting-Level of Assistance: Supervision  Dynamic Sitting-Level of Assistance: Supervision    Static Standing-Level of Assistance: Contact guard  Dynamic Standing - Level of Assistance: Contact guard    Functional Mobility  Transfers: Contact  Guard assist, Verbal assist/cues required  Bed Mobility - Needs Assistance: Min assist  Ambulation: functional mobility x10 feet x2 trials using rollator and CGA, slow pace    ADLs  Grooming: Set Up Assist, Performed seated  Toileting: Min assist  UB Dressing: Min assist, Performed seated  LB Dressing: Max assist, Total Assist, Performed seated    Vitals / Orthostatics  Vitals/Orthostatics: O2 sat >90% on 3 L at rest, following functional mobility x10 feet x2 trials using rollator and decreased to 85% on 3 L requiring 4 L Advance and rest to recover >90%    Patient at end of session: All needs in reach, Friends/Family present, In bed, Notified Nurse     Occupational Therapy Session Duration  OT Co-Treatment [mins]: 20       AM-PAC-Daily Activity  Lower Body Dressing assistance needs: A lot - Maximum/Moderate Assistance  Bathing assistance needs: A lot - Maximum/Moderate Assistance  Toileting assistance needs: A lot - Maximum/Moderate Assistance  Upper Body Dressing assistance needs: A Little - Minimal/Contact Guard Assist/Supervision  Personal Grooming assistance needs: A Little - Minimal/Contact Guard Assist/Supervision  Eating Meals assistance needs: None - Modified Independent/Independent    Daily Activity Score: 16    Score (in points): % of Functional Impairment, Limitation, Restriction  6: 100% impaired, limited, restricted  7-8: At least 80%, but less than 100% impaired, limited restricted  9-13: At least 60%, but less than 80% impaired, limited restricted  14-19: At least 40%, but less than 60% impaired, limited restricted  20-22: At least 20%, but less than 40% impaired, limited restricted  23: At least 1%, but less than 20% impaired, limited restricted  24: 0% impaired, limited restricted      I attest that I have reviewed the above information.  Signed: Gerard FORBES Lunger, OT  Filed 10/19/2024                 [1]   Past Medical History:  Diagnosis Date    Acute exacerbation of CHF (congestive heart failure) (CMS-HCC) 08/19/2023    Acute kidney injury superimposed on chronic kidney disease 10/11/2020    Acute on chronic diastolic (congestive) heart failure (CMS-HCC) 08/23/2020    Acute on chronic diastolic congestive heart failure (CMS-HCC) 08/23/2020    AKI (acute kidney injury) 04/14/2015    Lab Results  Component  Value  Date     CREATININE  1.90 (H)  06/12/2021     Had a bump in her creatinine when she was taking her diuretics every day.  She is currently taking 40 mg daily of torsemide  and 50 mg of spironolactone .  Her volume status is fragile.  Previously when she stopped her diuretic she becomes short of breath.  Plan: We will check her BMP today.  We will likely have to go to 40    Anemia 08/22/2019    Iron  deficiency anemia          Lab Results      Component    Value    Date           WBC    9.2    03/21/2021           RBC    3.67 (L)    03/21/2021           HGB    9.9 (L)    03/21/2021           HCT    30.7 (L)    03/21/2021  MCV    83.7    03/21/2021           MCH    26.9 03/21/2021           MCHC    32.1    03/21/2021           RDW    21.9 (H)    03/21/2021           PLT    318     Arthritis     Calculus of kidney     Calculus of ureter     CHF (congestive heart failure) (CMS-HCC)     Chronic atrial fibrillation    (CMS-HCC) 07/20/2019    COPD (chronic obstructive pulmonary disease) (CMS-HCC)     Diabetes (CMS-HCC)     Gangrenous cholecystitis 10/11/2020    Generalized edema  06/17/2021    GERD (gastroesophageal reflux disease)     Hydronephrosis     Hypertension     Hyponatremia 10/11/2020    Intermediate coronary syndrome (CMS-HCC) 03/13/2014    Lower extremity edema 09/28/2020    Lumbar stenosis     Microscopic hematuria     Nausea alone     Nephrolithiasis 04/17/2016    Neuropathy     Nocturia     Nocturia 07/01/2017    Other chronic cystitis     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  See plans for anemia, AKI  We are also tapering her gabapentin .  Currently on 300 mg 3 times daily.  We will decrease it to twice daily, and then nightly.  She states she is not having any recurrence of her pain.    Pulmonary hypertension    (CMS-HCC)     Renal colic     Shortness of breath 04/28/2020    Sleep apnea     Unstable angina pectoris    (CMS-HCC) 03/13/2014   [2]   Past Surgical History:  Procedure Laterality Date    BACK SURGERY  1995    CARPAL TUNNEL RELEASE Left 2014    HYSTERECTOMY  1971    IR INSERT CHOLECYSTOSTOMY TUBE PERCUTANEOUS  10/02/2020    IR INSERT CHOLECYSTOSMY TUBE PERCUTANEOUS 10/02/2020 Ivonne Butler Seed, MD IMG VIR H&V [facility]    LUMBAR DISC SURGERY      PR REMOVAL GALLBLADDER N/A 10/06/2020    Procedure: CHOLECYSTECTOMY;  Surgeon: Curtistine Mira Dunnings, MD;  Location: MAIN OR [facility];  Service: Trauma    PR RIGHT HEART CATH O2 SATURATION & CARDIAC OUTPUT N/A 09/30/2020    Procedure: Right Heart Catheterization;  Surgeon: Reyes Jerilynn Sage, MD;  Location: [facility] CATH;  Service: Cardiology    PR RIGHT HEART CATH O2 SATURATION & CARDIAC OUTPUT N/A 01/09/2021    Procedure: Right Heart Catheterization;  Surgeon: Norleen Deward Grumbling, MD;  Location: [facility] CATH;  Service: Cardiology   [3]   Family History  Problem Relation Age of Onset    Hypertension Mother     Cancer Father         COLON CANCER    No Known Problems Sister     No Known Problems Sister     No Known Problems Brother     No Known Problems Brother     No Known Problems Maternal Aunt     No Known Problems Maternal Uncle     No Known Problems Paternal Aunt     No Known Problems Paternal Uncle     No Known  Problems Maternal Grandmother     No Known Problems Maternal Grandfather     No Known Problems Paternal Grandmother     No Known Problems Paternal Grandfather     No Known Problems Other     Anesthesia problems Neg Hx     Broken bones Neg Hx     Clotting disorder Neg Hx     Collagen disease Neg Hx     Diabetes Neg Hx     Dislocations Neg Hx     Fibromyalgia Neg Hx     Gout Neg Hx     Hemophilia Neg Hx     Osteoporosis Neg Hx     Rheumatologic disease Neg Hx     Scoliosis Neg Hx     Severe sprains Neg Hx     Sickle cell anemia Neg Hx     Spinal Compression Fracture Neg Hx     GU problems Neg Hx     Kidney cancer Neg Hx     Prostate cancer Neg Hx

## 2024-10-19 NOTE — Unmapped (Signed)
 Care Management  Initial Transition Planning Assessment              General  Care Manager / Social Worker assessed the patient by : In person interview with patient, Discussion with Clinical Care team, Medical record review  Orientation Level: Oriented X4  Functional level prior to admission: Independent  Reason for referral: Discharge Planning    Contact/Decision Maker  Extended Emergency Contact Information  Primary Emergency Contact: [name],[name]  Address: [address]           [place], [place] [address] [place]  Home Phone: [phone]  Mobile Phone: [phone]  Relation: Daughter    Legal Next of Kin / Guardian / POA / Advance Directives     HCDM (With Legal Document To Support): [name],[name] - Daughter - [phone]    Advance Directive (Medical Treatment)  Does patient have an advance directive covering medical treatment?: Patient has advance directive covering medical treatment, copy in chart.    Health Care Decision Maker [HCDM] (Medical & Mental Health Treatment)  Healthcare Decision Maker: HCDM documented in the HCDM/Contact Info section.  Information offered on HCDM, Medical & Mental Health advance directives:: Referral made for patient.    Advance Directive (Mental Health Treatment)  Does patient have an advance directive covering mental health treatment?: Patient does not have advance directive covering mental health treatment.  Reason patient does not have an advance directive covering mental health treatment:: Patient does not wish to complete one at this time.    Readmission Information    Have you been hospitalized in the last 30 days?: No      Patient Information  Lives with: Children    Type of Residence: Private residence        Location/Detail: [address] [place] [address]  Home Phone:  [phone]    Support Systems/Concerns: Children, Family Members    Responsibilities/Dependents at home?: No    Home Care services in place prior to admission?: No          Outpatient/Community Resources in place prior to admission: Clinic  Agency detail (Name/Phone #): PCP Velia Novak    Equipment Currently Used at Home: commode chair, walker, rolling, oxygen   Current HME Agency (Name/Phone #): [facility] at baseline    Currently receiving outpatient dialysis?: No       Financial Information       Need for financial assistance?: No       Social Drivers of Health  Social Drivers of Health were addressed in provider documentation.  Please refer to patient history.  Social Drivers of Health     Food Insecurity: No Food Insecurity (10/19/2024)    Hunger Vital Sign     Worried About Running Out of Food in the Last Year: Never true     Ran Out of Food in the Last Year: Never true   Tobacco Use: Medium Risk (10/18/2024)    Patient History     Smoking Tobacco Use: Former     Smokeless Tobacco Use: Never     Passive Exposure: Not on file   Transportation Needs: No Transportation Needs (10/19/2024)    PRAPARE - Therapist, art (Medical): No     Lack of Transportation (Non-Medical): No   Alcohol Use: Not At Risk (10/16/2024)    Alcohol Use     How often do you have a drink containing alcohol?: Never     How many  drinks containing alcohol do you have on a typical day when you are drinking?: 1 - 2     How often do you have 5 or more drinks on one occasion?: Never   Housing: Low Risk  (10/19/2024)    Housing     Within the past 12 months, have you ever stayed: outside, in a car, in a tent, in an overnight shelter, or temporarily in someone else's home (i.e. couch-surfing)?: No     Are you worried about losing your housing?: No   Physical Activity: Unknown (11/04/2017)    Received from [facility]    Exercise Vital Sign     Days of Exercise per Week: Patient declined     Minutes of Exercise per Session: Patient declined   Utilities: Low Risk  (10/16/2024)    Utilities     Within the past 12 months, have you been unable to get utilities (heat, electricity) when it was really needed?: No   Stress: Unknown (11/04/2017)    Received from [facility] of Occupational Health - Occupational Stress Questionnaire     Feeling of Stress : Patient declined   Interpersonal Safety: Not At Risk (10/18/2024)    Interpersonal Safety     Unsafe Where You Currently Live: No     Physically Hurt by Anyone: No     Abused by Anyone: No   Substance Use: Low Risk  (10/16/2024)    Substance Use     In the past year, how often have you used prescription drugs for non-medical reasons?: Never     In the past year, how often have you used illegal drugs?: Never     In the past year, have you used any substance for non-medical reasons?: No   Intimate Partner Violence: Not At Risk (07/18/2024)    Humiliation, Afraid, Rape, and Kick questionnaire     Fear of Current or Ex-Partner: No     Emotionally Abused: No     Physically Abused: No     Sexually Abused: No   Social Connections: Unknown (11/04/2017)    Received from [facility]    Social Connection and Isolation Panel     Frequency of Communication with Friends and Family: Patient declined     Frequency of Social Gatherings with Friends and Family: Patient declined     Attends Religious Services: Patient declined     Database administrator or Organizations: Patient declined     Attends Banker Meetings: Patient declined     Marital Status: Patient declined   Programmer, applications: Low Risk  (10/19/2024)    Overall Financial Resource Strain (CARDIA)     Difficulty of Paying Living Expenses: Not hard at all   Health Literacy: Medium Risk (10/16/2024)    Health Literacy     : Sometimes   Internet Connectivity: No Internet connectivity concern identified (10/16/2024)    Internet Connectivity     Do you have access to internet services: Yes     How do you connect to the internet: Personal Device at home     Is your internet connection strong enough for you to watch video on your device without major problems?: Yes     Do you have enough data to get through the month?: Yes     Does at least one of the devices have a camera that you can use for video chat?: Yes  Complex Discharge Information    Is patient identified as a difficult/complex discharge?: No    Discharge Needs Assessment  Concerns to be Addressed: discharge planning, home safety, adjustment to diagnosis/illness    Clinical Risk Factors: > 65, New Diagnosis, Multiple Diagnoses (Chronic), Functional Limitations, Visual or Hearing Impaired    Barriers to taking medications: No [facility])    Prior overnight hospital stay or [name] in last 90 days: No    Anticipated Changes Related to Illness: inability to care for self    Equipment Needed After Discharge: none    Discharge Facility/Level of Care Needs: other (see comments) (home with home health)    Readmission  Risk of Unplanned Readmission Score: UNPLANNED READMISSION SCORE: 26.43%  Predictive Model Details          26% (High)  Factor Value    Calculated 10/19/2024 12:04 22% Number of ED visits in last six months 5    [place] Risk of Unplanned Readmission Model 21% Number of active inpatient medication orders 37     8% Number of hospitalizations in last year 2     8% ECG/EKG order present in last 6 months     6% Latest BUN high (55 mg/dL)     5% Imaging order present in last 6 months     5% Age 50     5% Latest hemoglobin low (11.2 g/dL)     4% Charlson Comorbidity Index 4     4% Diagnosis of deficiency anemia present     4% Active anticoagulant inpatient medication order present     4% Latest creatinine high (2.09 mg/dL)     3% Diagnosis of renal failure present     2% Future appointment scheduled     1% Active ulcer inpatient medication order present     0% Current length of stay 0.448 days      Readmitted Within the Last 30 Days? (No if blank)   Patient at risk for readmission?: Yes    Discharge Plan  Screen findings are: Discharge planning needs identified or anticipated (Comment). (will arrange home health)    Expected Discharge Date: 10/20/2024    Expected Transfer from Critical Care:      Quality data for continuing care services shared with patient and/or representative?: Yes  Patient and/or family were provided with choice of facilities / services that are available and appropriate to meet post hospital care needs?: Yes   List choices in order highest to lowest preferred, if applicable. : choice offered, [facility]    Initial Assessment complete?: Yes

## 2024-10-19 NOTE — Unmapped (Signed)
 Cardiology Consult Note    Requesting Attending Physician :  No att. providers found  Service Requesting Consult : HBR - HBR: Hospitalist Cardinal     Date of Service: October 19, 2024 8:08 AM   Code Status:   Orders Placed This Encounter   Procedures    Full Code     Standing Status:   Standing     Number of Occurrences:   1         Patient ID: Barbara Huber is a 83 y.o. 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      Assessment & Plan  # Acute on chronic diastolic heart failure exacerbation, decompensated I TTR amyloid   Acute on chronic [name] exacerbated by volume overload, likely due to dietary indiscretion and suboptimal diuresis with torsemide  and not taking metolazone  as needed. Baseline weight fluctuations with recent increase from 165-167 to 169 lbs. proBNP elevated at 4681 and high-sensitivity troponin is uptrending currently at 75.  High-sensitivity troponin elevated likely secondary to demand ischemia in the setting of CHF exacerbation.  There is no concerns for ACS at this time as the patient is without any current chest pain or anginal symptoms.  CXR shows evidence of worsening pulmonary edema and recent fever concerning for possible community-acquired pneumonia.  Previous echocardiogram from 05/2024 shows normal LV systolic function with EF 60-65% and moderate pulmonary hypertension. Recommend diuresis and GDMT as below.    - trend HS troponin until peak.   - Continue diuresis with IV Lasix  100 mg daily with a goal net output of -1.5.  - Administer metolazone  2.5 mg as needed to augment net output.   - obtain iron  panel   - (GDMT): Spironolactone  being held due to hyperkalemia.  SGLT2 inhibitor contraindicated due to recurrent UTIs.  - continue tafamidis  61 mg daily.   - strict I&O's.   - Monitor daily weights; preferably standing.   - Continue telemetry monitoring  - Community-acquired pneumonia management per primary team.    # Acute on chronic hypoxic respiratory failure  Chronic hypoxic respiratory failure with acute exacerbation, likely secondary to volume overload from heart failure. Baseline oxygen  requirement is 3L nasal cannula. Current oxygenation status improved with diuresis.   - CHF management as above,       # Atrial fibrillation, chronic, paroxysmal   Currently in sinus rhythm with heart rate of 65 bpm.  She was previously on Eliquis  in the past but this was discontinued due to recurrent anemia and has not been interested in pursuing watchman  Patient currently not on anticoagulation.   - conservative management.           Recommendations given to the primary team via epic chat.   Thank you for consulting medicine Cardiology. This patient was seen and discussed with the consult attending. Please see attending attestation for further insights into management. If any further questions or concerns arise please do not hesitate to contact us  via pager or epic chat.     History of Present Illness:     Reason for Consult:   CHF exacerbation    Barbara Huber is a 83 y.o. 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   History of Present Illness  Barbara Huber is an 83 year old female with chronic dyspnea and heart failure who presents with worsening shortness of breath. She is accompanied by her daughter.    She presented to the ER with worsening shortness of breath, particularly on exertion. She has a history of chronic dyspnea and is on three liters of nasal cannula at baseline.  She was found to be fluid volume overloaded with a elevated proBNP of 4681 and a high-sensitivity troponin that is elevated and up trended from 42 >>49>>75.  She was given IV Lasix  160 mg while in the ER.    She developed a fever of 102.6??F in the ER and was started on broad-spectrum antibiotics for suspected community-acquired pneumonia. She has had a cough for about two weeks, which started as congestion and then stopped. She had pneumonia in July, and the cough returned slightly after that. Her daughter noted that she sometimes has a cough that is not persistent.      She has a history of heart failure with preserved ejection fraction secondary to cardiac amyloid, [name] stage four, hypertension, atrial fibrillation, and COPD. She takes torsemide  120 mg daily at home, and her spironolactone  is currently held due to hyperkalemia. She also takes midodrine  TID. Her weight has been fluctuating, with a recent increase of three pounds over a few days, which her daughter whom is at the bedside attributes to increased appetite. She has been monitoring her weight daily and noted that it has been inconsistent, sometimes increasing overnight. The daughter mentioned that sometimes her diuretics are 'hit or miss' in terms of effectiveness, and she occasionally takes an extra dose of torsemide  when needed. She took an extra dose of torsemide  before coming to the ER, which helped with her symptoms prior to arrival.    Her creatinine is elevated at 2.09, with a baseline of around 2 to 2.4. Potassium has improved from 5.3 to 4.6 since admission. Barbara Huber blood cells are elevated at 14.7. A chest x-ray showed worsening pulmonary edema with a small left pleural effusion. A previous echocardiogram from June showed normal LV systolic function with an estimated EF of 60-65% and moderate pulmonary hypertension. She has grade three ATTR positive cardiac amyloid.    The patient reports that she feels good and has been urinating more since receiving IV Lasix . The patient denies swelling in her legs, chest pain, or dizziness at this time.       The patient was last seen in the heart failure clinic on 08/02/2024 and was noted to have mild LLE edema and mildly elevated JVP; therefore, her torsemide  was increased to 60 mg twice daily (from 80 mg daily and metolazone  was continue as as needed.      Allergies:   Allergies[1]    Medications:   Scheduled Meds:Scheduled Medications[2]  Continuous Infusions:Infusions Meds[3]  PRN Meds:.PRN Medications[4]  Outpatient Medications: Current Medications[5]    Medical History:  Past Medical History[6]    Social History:  Tobacco Use History[7]  Social History     Substance and Sexual Activity   Alcohol Use No     Social History  Substance and Sexual Activity   Drug Use No       Family History:  Family History[8]    Code Status:  Full Code    Review of Systems: 12 point review negative except for stated above    Objective:     Physical Exam:  VITAL SIGNS: Temp:  [36.4 ??C (97.5 ??F)-39.2 ??C (102.6 ??F)] 36.4 ??C (97.5 ??F)  Pulse:  [66-91] 66  SpO2 Pulse:  [66-91] 66  Resp:  [14-23] 17  BP: (88-133)/(28-114) 127/50  FiO2 (%):  [3 %] 3 %  SpO2:  [90 %-98 %] 97 %    Physical Exam   Constitutional: She appears chronically ill.   HENT:   Nose: Nose normal. Mouth/Throat: Oropharynx is clear.   Eyes: Pupils are equal, round, and reactive to light. Conjunctivae are normal.   Neck: No JVD present.   Cardiovascular: Normal rate, regular rhythm, S1 normal and S2 normal.   Pulses:       Radial pulses are 2+ on the right side and 2+ on the left side.   Pulmonary/Chest: Effort normal. She exhibits no tenderness.   Bibasilar crackles but clear in upper lobes bilaterally; chest expansion symmetrical    Abdominal: Soft. Bowel sounds are normal. She exhibits no distension.   Musculoskeletal:         General: No edema. Normal range of motion.      Cervical back: Normal range of motion and neck supple.   Neurological: She is alert and oriented to person, place, and time.   Skin: Skin is warm and dry.          Test Results  I personally reviewed the results of the following diagnostic studies  ECG: 10/19/2024 at 0102 personally reviewed atrial fibrillation, poor R wave progression and left axis deviation with T wave inversions noted in aVR, inferior leads and anterior leads.    Echocardiogram: 05/2024  Summary    1. The left ventricle is normal in size with moderately increased wall  thickness.    2. The left ventricular systolic function is normal, LVEF is visually  estimated at 60-65%.    3. Mitral annular calcification is present (moderate).    4. The right ventricle is normal in size, with normal systolic function.    5. There is moderate pulmonary hypertension.    6. IVC size and inspiratory change suggest mildly elevated right atrial  pressure. (5-10 mmHg).      CXR:10/18/2024  Impression  Increased interstitial opacities with new small left pleural effusion, favored to represent worsening pulmonary edema given relatively rapid interval change.      Right heart cath: 01/09/2021  Findings:  Elevated left and right sided filling pressures  Prominent Y-descent in right atrium  Preserved cardiac output  Right Heart Catheterization       Pressures   Right atrium Mean 23 mm Hg   Right ventricle Systolic 65 mm Hg, End-diastolic 12 mm Hg   Pulmonary artery  Systolic 85 mm Hg, Diastolic 40 mm Hg, Mean 55 mm Hg   Pulmonary capillary wedge Mean 32 mm Hg      Arterial saturation: 100% (presumed via oxymetry)  Mixed venous saturation: 57%     Fick cardiac output: 5.3 L/min  Fick cardiac index: 2.5 L/min/m^2     Thermal cardiac output: 5.4 L/min  Thermal cardiac index: 2.5 L/min/m^2     Systemic vascular resistance (SVR): 1244 dynes*s/cm^5  Pulmonary vascular resistance (PVR): 4.2 Wood units  NM Spect CT singe: 10/2020  Impression  Grade 3. ATTR positive.  Prominent mediastinal lymph nodes as noted above.      Nuclear Stress Test:none on file   Cardiac Cath:none on file       Labs:   Lab Results   Component Value Date    CKTOTAL 27.0 (L) 07/03/2024    TROPONINI 75 (HH) 10/19/2024    TROPONINI 49 (HH) 10/18/2024    TROPONINI 42 (HH) 10/18/2024     Lab Results   Component Value Date    WBC 14.7 (H) 10/19/2024    HGB 11.2 (L) 10/19/2024    HCT 33.7 (L) 10/19/2024    PLT 151 10/19/2024       Lab Results   Component Value Date    NA 138 10/19/2024    K 4.6 10/19/2024    CL 100 10/19/2024    CO2 23.5 10/19/2024    BUN 55 (H) 10/19/2024    CREATININE 2.09 (H) 10/19/2024    GLU 180 (H) 10/19/2024    CALCIUM  8.8 10/19/2024    MG 2.4 10/19/2024    PHOS 3.8 07/31/2024       Lab Results   Component Value Date    BILITOT 0.5 10/18/2024    BILIDIR 0.20 07/30/2024    PROT 8.3 (H) 10/18/2024    ALBUMIN 3.5 10/18/2024    ALT <7 (L) 10/18/2024    AST 30 10/18/2024    ALKPHOS 113 10/18/2024    GGT 215 (H) 09/29/2020       Lab Results   Component Value Date    PT 11.9 10/18/2024    INR 1.04 10/18/2024    APTT 42.4 (H) 07/03/2024     Ht Readings from Last 3 Encounters:   10/18/24 160 cm (5' 3)   10/16/24 162.3 cm (5' 3.9)   09/14/24 162.3 cm (5' 3.9)     Lab Results   Component Value Date    INR 1.04 10/18/2024    INR 1.68 07/03/2024    INR 1.04 09/24/2023         I personally spent 45 minutes face-to-face and non-face-to-face in the care of this patient, which includes all pre, intra, and post visit time on the date of service; reviewing EMR, patient physical exam, reviewing results, patient/family education, communication with primary team, coordinating outpatient follow up/services and  all related documentation.  All documented time was specific to the E/M visit and does not include any procedures that may have been performed.      Icel Castles, ACNP-AG  October 19, 2024 8:08 AM  Division of Cardiology   Cardiovascular Medicine         Disclaimer: Nuance Dragon voice recognition software was used to create portions of this document.  An attempt at proofreading has been made to minimize errors.  If, however, the reader notes inconsistent information and/or needs to ask questions concerning any part of the document, please contact me for clarification.  Occasional interpretation errors may inadvertently occur due to limitations in the software.            [1]   Allergies  Allergen Reactions    Nitrofurantoin Anaphylaxis and Other (See Comments)    Lipitor  [Atorvastatin ] Muscle Pain     SOB, Headache, fatigue, sick on my stomach   [2]    amiodarone   200 mg Oral Daily    cefTRIAXone   1 g Intravenous Q24H    doxycycline   100 mg Oral BID    ferrous  sulfate  325 mg Oral Daily    fluticasone  furoate-vilanterol  1 puff Inhalation Daily (RT)    gabapentin   100 mg Oral BID    heparin  (porcine) for subcutaneous use  5,000 Units Subcutaneous Q8H SCH    midodrine   5 mg Oral TID    pantoprazole   20 mg Oral Daily    tafamidis   61 mg Oral Daily    umeclidinium  1 puff Inhalation Daily (RT)   [3] [4] acetaminophen , cyclobenzaprine , melatonin, ondansetron  **OR** ondansetron , oxyCODONE , polyethylene glycol  [5]   Current Facility-Administered Medications:     acetaminophen  (TYLENOL ) tablet 650 mg, 650 mg, Oral, Q4H PRN, Meryle, Amy E, MD    amiodarone  (PACERONE ) tablet 200 mg, 200 mg, Oral, Daily, Meryle, Amy E, MD    cefTRIAXone  (ROCEPHIN ) 1 g in sodium chloride  0.9 % (NS) 100 mL IVPB-MBP, 1 g, Intravenous, Q24H, Tierney, Amy E, MD    cyclobenzaprine  (FLEXERIL ) tablet 10 mg, 10 mg, Oral, BID PRN, Meryle, Amy E, MD    doxycycline  (VIBRA -TABS) tablet 100 mg, 100 mg, Oral, BID, Meryle, Amy E, MD    ferrous sulfate  tablet 325 mg, 325 mg, Oral, Daily, Meryle, Amy E, MD    fluticasone  furoate-vilanterol (BREO ELLIPTA ) 100-25 mcg/dose inhaler 1 puff, 1 puff, Inhalation, Daily (RT), Meryle, Amy E, MD    gabapentin  (NEURONTIN ) capsule 100 mg, 100 mg, Oral, BID, Meryle, Amy E, MD, 100 mg at 10/19/24 0225    heparin  (porcine) 5,000 unit/mL injection 5,000 Units, 5,000 Units, Subcutaneous, Q8H SCH, Meryle, Amy E, MD, 5,000 Units at 10/19/24 0641    melatonin tablet 3 mg, 3 mg, Oral, Nightly PRN, Meryle, Amy E, MD    midodrine  (PROAMATINE ) tablet 5 mg, 5 mg, Oral, TID, Meryle, Amy E, MD    ondansetron  (ZOFRAN ) injection 4 mg, 4 mg, Intravenous, Q8H PRN **OR** ondansetron  (ZOFRAN ) injection 8 mg, 8 mg, Intravenous, Q8H PRN, Meryle, Amy E, MD    oxyCODONE  (ROXICODONE ) immediate release tablet 5 mg, 5 mg, Oral, Q8H PRN, Meryle, Amy E, MD    pantoprazole  (Protonix ) EC tablet 20 mg, 20 mg, Oral, Daily, Tierney, Amy E, MD    polyethylene glycol (MIRALAX ) packet 17 g, 17 g, Oral, Daily PRN, Meryle, Amy E, MD    tafamidis  cap 61 mg, 61 mg, Oral, Daily, Meryle, Amy E, MD    umeclidinium (INCRUSE ELLIPTA ) 62.5 mcg/actuation inhaler 1 puff, 1 puff, Inhalation, Daily (RT), Meryle Greig BRAVO, MD  [6]   Past Medical History:  Diagnosis Date    Acute exacerbation of CHF (congestive heart failure) (CMS-HCC) 08/19/2023    Acute kidney injury superimposed on chronic kidney disease 10/11/2020    Acute on chronic diastolic (congestive) heart failure (CMS-HCC) 08/23/2020    Acute on chronic diastolic congestive heart failure (CMS-HCC) 08/23/2020    AKI (acute kidney injury) 04/14/2015    Lab Results  Component  Value  Date     CREATININE  1.90 (H)  06/12/2021     Had a bump in her creatinine when she was taking her diuretics every day.  She is currently taking 40 mg daily of torsemide  and 50 mg of spironolactone .  Her volume status is fragile.  Previously when she stopped her diuretic she becomes short of breath.  Plan: We will check her BMP today.  We will likely have to go to 40    Anemia 08/22/2019    Iron  deficiency anemia          Lab Results  Component    Value    Date           WBC    9.2    03/21/2021           RBC    3.67 (L) 03/21/2021           HGB    9.9 (L)    03/21/2021           HCT    30.7 (L)    03/21/2021           MCV    83.7    03/21/2021           MCH    26.9    03/21/2021           MCHC    32.1    03/21/2021           RDW    21.9 (H)    03/21/2021           PLT    318     Arthritis     Calculus of kidney     Calculus of ureter     CHF (congestive heart failure) (CMS-HCC)     Chronic atrial fibrillation    (CMS-HCC) 07/20/2019    COPD (chronic obstructive pulmonary disease) (CMS-HCC)     Diabetes (CMS-HCC)     Gangrenous cholecystitis 10/11/2020    Generalized edema  06/17/2021    GERD (gastroesophageal reflux disease)     Hydronephrosis     Hypertension     Hyponatremia 10/11/2020    Intermediate coronary syndrome (CMS-HCC) 03/13/2014    Lower extremity edema 09/28/2020    Lumbar stenosis     Microscopic hematuria     Nausea alone     Nephrolithiasis 04/17/2016    Neuropathy     Nocturia     Nocturia 07/01/2017    Other chronic cystitis     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  See plans for anemia, AKI  We are also tapering her gabapentin .  Currently on 300 mg 3 times daily.  We will decrease it to twice daily, and then nightly.  She states she is not having any recurrence of her pain.    Pulmonary hypertension    (CMS-HCC)     Renal colic     Shortness of breath 04/28/2020    Sleep apnea     Unstable angina pectoris    (CMS-HCC) 03/13/2014   [7]   Social History  Tobacco Use   Smoking Status Former    Current packs/day: 0.00    Types: Cigarettes    Quit date: 2019    Years since quitting: 6.8   Smokeless Tobacco Never   Tobacco Comments    Quit a few years ago   [8]   Family History  Problem Relation Age of Onset    Hypertension Mother     Cancer Father         COLON CANCER    No Known Problems Sister     No Known Problems Sister     No Known Problems Brother     No Known Problems Brother     No Known Problems Maternal Aunt     No Known Problems Maternal Uncle     No Known Problems Paternal Aunt     No Known Problems Paternal Uncle     No Known Problems Maternal Grandmother     No Known  Problems Maternal Grandfather     No Known Problems Paternal Grandmother     No Known Problems Paternal Grandfather     No Known Problems Other     Anesthesia problems Neg Hx     Broken bones Neg Hx     Clotting disorder Neg Hx     Collagen disease Neg Hx     Diabetes Neg Hx     Dislocations Neg Hx     Fibromyalgia Neg Hx     Gout Neg Hx     Hemophilia Neg Hx     Osteoporosis Neg Hx     Rheumatologic disease Neg Hx     Scoliosis Neg Hx     Severe sprains Neg Hx     Sickle cell anemia Neg Hx     Spinal Compression Fracture Neg Hx     GU problems Neg Hx     Kidney cancer Neg Hx     Prostate cancer Neg Hx

## 2024-10-19 NOTE — Unmapped (Signed)
 [facility] Medicine   History and Physical       Assessment and Plan     Barbara Huber is a 83 y.o. female who is presenting to [facility] with Acute on chronic heart failure with preserved ejection fraction ([name]) (CMS-HCC), in the setting of the following pertinent/contributing co-morbidities: [name] 2/2 cardiac amyloidosis, [name] IV, HTN, afib, COPD on 3L [place] at baseline .      Shortness of breath, secondary to Acute on chronic heart failure with preserved ejection fraction ([name]) (CMS-HCC) and pneumonia in setting of chronic respiratory failure on 3L [place] at baseline:  Chronic illness with severe exacerbation or progression that has a significant risk of morbidity without appropriate treatment  Patient's shortness of breath likely multifactorial with contributions from volume overload from [name] exacerbation, likely pneumonia (as patient with fever in ED). Patient has chronic hypoxic respiratory failure on baseline 3L [place]; currently on 4L  though no signs of respiratory distress so no acute hypoxic respiratory failure at this time.   - s/p 160mg  IV lasix  in ED; re-evaluate in AM and likely redose   - cont ceftriaxone  and doxycycline  for CAP coverage  - cont home midodrine  TID (suspect softer BP in ED was secondary to IV lasix )  - hold home spironolactone  in setting of hyperkalemia  - cont home tafamidis  (may need to bring from home)  - consider cardiology consult    Secondary/Additional Active Problems:  Afib (not currently on anticoagulation):  - cont home amiodarone     COPD on 3L  (chronic hypoxic respiratory failure):  - formulary sub for home advair  and spiriva     GERD:   - cont PPI    Chronic pain:  - cont home oxycodone  and gabapentin     Hyperkalemia:   - hold home spironolactone  for now, anticipate can resume on discharge    [name] IV: baseline creatinine appears to fluctuate but likely around 2. Creatinine on admission 1.86  - CTM  - avoid nephrotoxic agents as able    Prophylaxis  -subcutaneous heparin     Diet  -Nutrition Therapy Regular/House    Code Status / HCDM  -Full Code, Discussed with patient at the time of admission   -  HCDM (With Legal Document To Support): Barbara Huber,Barbara Huber - Daughter - [phone]    Significant Comorbid Conditions:         Issues Impacting Complexity of Management:          I personally spent greater than 75 minutes face-to-face and non-face-to-face in the care of this patient, which includes all pre, intra, and post visit time on the date of service.  All documented time was specific to the E/M visit and does not include any procedures that may have been performed.    Estimated Date of Discharge:     HPI      Barbara Huber is a 83 y.o. female PMHx [name] 2/2 cardiac amyloidosis, [name] IV, HTN, afib, COPD on 3L  at baseline who is presenting to [facility] with Acute on chronic heart failure with preserved ejection fraction ([name]) (CMS-HCC). Patient states she had shortness of breath start yesterday. She states that her breathing feels similar to her hospitalization in August for [name] exacerbation. Patient's daughter notes that the swelling in her BLE is not bad for her, though she notes the patient may be carrying some extra fluid in her abdomen.     ER course was notable for initially afebrile, however while in ED developed a fever to 39.2. Hemodynamically stable. Labs notable  for leukocytosis 11.7, potassium 5.3, creatinine 1.86, initial troponin 42, pro-BNP 4618.    CXR with     Increased interstitial opacities with new small left pleural effusion, favored to represent worsening pulmonary edema given relatively rapid interval change.         Med Rec Confidence   I reviewed the Medication List. The current list is Accurate    Physical Exam   Temp:  [36.7 ??C (98 ??F)-39.2 ??C (102.6 ??F)] 39.2 ??C (102.6 ??F)  Pulse:  [82-91] 82  SpO2 Pulse:  [81-91] 81  Resp:  [14-23] 17  BP: (92-133)/(44-114) 92/44  FiO2 (%):  [3 %] 3 %  SpO2:  [90 %-93 %] 90 %  Body mass index is 29.58 kg/m??.  General: well-developed, laying comfortably in stretcher, no acute distress  HEENT:  EOMI. Sclerae anicteric and without injection. Oropharynx moist.   NECK: trachea midline and mobile, no submandibular or cervical lymphadenopathy  RESP: bibasilar crackles, no increased work of breathing on room air  CV: Regular rate and rhythm. Normal S1 and S2. No murmurs or gallops.   ABD: NABS, soft, non-distended, non-tender to palpation  EXT: 2+ BLE pitting edema, cyanosis, or clubbing. Posterior tibial pulses are 2+ and symmetric.   MSK: No edema, normal range of motion  SKIN: No rashes or lesions  NEURO: No focal neurologic deficits, A/Ox3

## 2024-10-20 LAB — CBC
HEMATOCRIT: 34.6 % (ref 34.0–44.0)
HEMOGLOBIN: 11.4 g/dL (ref 11.3–14.9)
MEAN CORPUSCULAR HEMOGLOBIN CONC: 33.1 g/dL (ref 32.0–36.0)
MEAN CORPUSCULAR HEMOGLOBIN: 30.3 pg (ref 25.9–32.4)
MEAN CORPUSCULAR VOLUME: 91.6 fL (ref 77.6–95.7)
MEAN PLATELET VOLUME: 9.1 fL (ref 6.8–10.7)
PLATELET COUNT: 141 10*9/L — ABNORMAL LOW (ref 150–450)
RED BLOOD CELL COUNT: 3.78 10*12/L — ABNORMAL LOW (ref 3.95–5.13)
RED CELL DISTRIBUTION WIDTH: 14.5 % (ref 12.2–15.2)
WBC ADJUSTED: 16.7 10*9/L — ABNORMAL HIGH (ref 3.6–11.2)

## 2024-10-20 LAB — BASIC METABOLIC PANEL
ANION GAP: 14 mmol/L (ref 5–14)
BLOOD UREA NITROGEN: 51 mg/dL — ABNORMAL HIGH (ref 9–23)
BUN / CREAT RATIO: 25
CALCIUM: 9.4 mg/dL (ref 8.7–10.4)
CHLORIDE: 99 mmol/L (ref 98–107)
CO2: 25.8 mmol/L (ref 20.0–31.0)
CREATININE: 2.08 mg/dL — ABNORMAL HIGH (ref 0.55–1.02)
EGFR CKD-EPI (2021) FEMALE: 23 mL/min/1.73m2 — ABNORMAL LOW (ref >=60)
GLUCOSE RANDOM: 115 mg/dL (ref 70–179)
POTASSIUM: 4.4 mmol/L (ref 3.4–4.8)
SODIUM: 139 mmol/L (ref 135–145)

## 2024-10-20 LAB — MAGNESIUM: MAGNESIUM: 2.4 mg/dL (ref 1.6–2.6)

## 2024-10-20 MED ORDER — AMOXICILLIN 875 MG-POTASSIUM CLAVULANATE 125 MG TABLET
ORAL_TABLET | Freq: Two times a day (BID) | ORAL | 0 refills | 5.00000 days | Status: CP
Start: 2024-10-20 — End: 2024-10-20

## 2024-10-20 MED ORDER — DOXYCYCLINE HYCLATE 100 MG TABLET
ORAL_TABLET | Freq: Two times a day (BID) | ORAL | 0 refills | 4.00000 days | Status: CP
Start: 2024-10-20 — End: 2024-10-24
  Filled 2024-10-20: qty 8, 4d supply, fill #0

## 2024-10-20 MED ORDER — AMOXICILLIN 500 MG-POTASSIUM CLAVULANATE 125 MG TABLET
ORAL_TABLET | Freq: Two times a day (BID) | ORAL | 0 refills | 5.00000 days | Status: CP
Start: 2024-10-20 — End: 2024-10-25
  Filled 2024-10-20: qty 10, 5d supply, fill #0

## 2024-10-20 MED ADMIN — oxyCODONE (ROXICODONE) immediate release tablet 5 mg: 5 mg | ORAL | @ 02:00:00 | Stop: 2024-10-26

## 2024-10-20 MED ADMIN — dextromethorphan-guaiFENesin (ROBITUSSIN-DM) 2-20 mg/mL oral syrup: 5 mL | ORAL | @ 02:00:00

## 2024-10-20 MED ADMIN — gabapentin (NEURONTIN) capsule 100 mg: 100 mg | ORAL | @ 02:00:00

## 2024-10-20 MED ADMIN — melatonin tablet 3 mg: 3 mg | ORAL | @ 02:00:00

## 2024-10-20 MED ADMIN — cefTRIAXone (ROCEPHIN) 1 g in sodium chloride 0.9 % (NS) 100 mL IVPB-MBP: 1 g | INTRAVENOUS | @ 02:00:00 | Stop: 2024-10-23

## 2024-10-20 MED ADMIN — polyethylene glycol (MIRALAX) packet 17 g: 17 g | ORAL | @ 02:00:00

## 2024-10-20 MED ADMIN — heparin (porcine) 5,000 unit/mL injection 5,000 Units: 5000 [IU] | SUBCUTANEOUS | @ 02:00:00

## 2024-10-20 MED ADMIN — fosfomycin (MONUROL) packet 3 g: 3 g | ORAL | @ 16:00:00 | Stop: 2024-10-20

## 2024-10-20 MED ADMIN — midodrine (PROAMATINE) tablet 5 mg: 5 mg | ORAL | @ 18:00:00 | Stop: 2024-10-20

## 2024-10-20 MED ADMIN — midodrine (PROAMATINE) tablet 5 mg: 5 mg | ORAL | @ 13:00:00 | Stop: 2024-10-20

## 2024-10-20 MED ADMIN — tafamidis cap 61 mg: 61 mg | ORAL | @ 13:00:00 | Stop: 2024-10-20

## 2024-10-20 MED ADMIN — heparin (porcine) 5,000 unit/mL injection 5,000 Units: 5000 [IU] | SUBCUTANEOUS | @ 11:00:00 | Stop: 2024-10-20

## 2024-10-20 MED ADMIN — heparin (porcine) 5,000 unit/mL injection 5,000 Units: 5000 [IU] | SUBCUTANEOUS | @ 18:00:00 | Stop: 2024-10-20

## 2024-10-20 MED ADMIN — dextromethorphan-guaiFENesin (ROBITUSSIN-DM) 2-20 mg/mL oral syrup: 5 mL | ORAL | @ 09:00:00 | Stop: 2024-10-20

## 2024-10-20 MED ADMIN — gabapentin (NEURONTIN) capsule 100 mg: 100 mg | ORAL | @ 13:00:00 | Stop: 2024-10-20

## 2024-10-20 MED ADMIN — ferrous sulfate tablet 325 mg: 325 mg | ORAL | @ 13:00:00 | Stop: 2024-10-20

## 2024-10-20 MED ADMIN — oxyCODONE (ROXICODONE) immediate release tablet 5 mg: 5 mg | ORAL | @ 13:00:00 | Stop: 2024-10-20

## 2024-10-20 MED ADMIN — amiodarone (PACERONE) tablet 200 mg: 200 mg | ORAL | @ 13:00:00 | Stop: 2024-10-20

## 2024-10-20 MED ADMIN — fluticasone furoate-vilanterol (BREO ELLIPTA) 100-25 mcg/dose inhaler 1 puff: 1 | RESPIRATORY_TRACT | @ 14:00:00 | Stop: 2024-10-20

## 2024-10-20 MED ADMIN — guaiFENesin (ROBITUSSIN) oral syrup: 200 mg | ORAL | @ 13:00:00 | Stop: 2024-10-20

## 2024-10-20 MED ADMIN — doxycycline (VIBRA-TABS) tablet 100 mg: 100 mg | ORAL | @ 10:00:00 | Stop: 2024-10-20

## 2024-10-20 MED ADMIN — umeclidinium (INCRUSE ELLIPTA) 62.5 mcg/actuation inhaler 1 puff: 1 | RESPIRATORY_TRACT | @ 14:00:00 | Stop: 2024-10-20

## 2024-10-20 MED ADMIN — pantoprazole (Protonix) EC tablet 20 mg: 20 mg | ORAL | @ 13:00:00 | Stop: 2024-10-20

## 2024-10-20 MED ADMIN — furosemide (LASIX) 100 mg in sodium chloride (NS) 0.9 % 50 mL IVPB: 100 mg | INTRAVENOUS | @ 14:00:00 | Stop: 2024-10-20

## 2024-10-20 NOTE — Discharge Summary (Addendum)
 Physician Discharge Summary HBR  3 BT1 HBR  [address]  [place] [address]  Dept: [phone]  Loc: [phone]     Identifying Information:   Barbara Huber  Aug 20, 1941  [id number]    Primary Care Physician: [name], MD     Code Status: Full Code    Admit Date: 10/18/2024    Discharge Date: 10/20/2024     Discharge To: Home with Home Health and/or PT/OT    Discharge Service: HBR - HBR: Hospitalist [facility]     Discharge Attending Physician: Marga Skeens, MD    Discharge Diagnoses:  Principal Problem:    Acute on chronic heart failure with preserved ejection fraction ([name]) (CMS-HCC) (POA: Yes)  Active Problems:    COPD (chronic obstructive pulmonary disease) (CMS-HCC) (POA: Yes)    Essential hypertension (POA: Yes)    Chronic respiratory failure with hypoxia    (CMS-HCC) (POA: Yes)    Atrial fibrillation    (CMS-HCC) (POA: Yes)    Anemia in chronic kidney disease (POA: Yes)    Chronic kidney disease ([name]), stage IV (severe)    (CMS-HCC) (POA: Yes)    Chronic prescription opiate use (POA: Not Applicable)    Cardiac amyloidosis (CMS-HCC) (POA: Yes)    Pneumonia due to infectious organism (POA: Yes)  Resolved Problems:    * No resolved hospital problems. *      Outpatient Provider Follow Up Issues:   Follow up with cardiology    Hospital Course: Barbara Huber is a 83 y.o. female who is presenting to [facility] with Acute on chronic heart failure with preserved ejection fraction ([name]) (CMS-HCC), in the setting of the following pertinent/contributing co-morbidities: [name] 2/2 cardiac amyloidosis, [name] IV, HTN, afib, COPD on 3L [place] at baseline .        Acute on chronic heart failure with preserved ejection fraction ([name])- Community acquired pneumonia in setting of chronic respiratory failure on 3L [place] at baseline Patient's shortness of breath likely multifactorial with contributions from volume overload from [name] exacerbation, pneumonia (as patient with fever in ED), and chronic hypoxic respiratory failure from COPD.  RRP negative on admission. Received 160 mg IV lasix  in ED followed by Lasix  100 mg IV daily x 2 days.  Metolazone  2.5 mg as needed to augment net output was not started given clinical stability and her oxygen  requirement was at her base line. Continue home diuretics dose at discharge.  For her pneumonia she was treated with ceftriaxone  and doxycycline  for CAP coverage and subsequently discharged on Augmentin  and Doxycycline .  She remained afebrile with mild leukocytosis and urine culture grew mixed gram positive/gram negative organisms.  Her home spironolactone  was held initially given hyperkalemia but resumed with resolution of her hyperkalemia.  She will continue with her midodrine  and will continue home tafamidis . She was also seen by cardiology consulted while inpatient and will follow up as outpatient.     Secondary/Additional Active Problems:  Afib (not currently on anticoagulation):  - cont home amiodarone      COPD on 3L [place] (chronic hypoxic respiratory failure):  - continue home advair  and spiriva      GERD:   - cont PPI     Chronic pain:  - cont home oxycodone  and gabapentin      Hyperkalemia:   - resolved     [name] IV: baseline creatinine appears to fluctuate but likely around 1.55-2.2. Creatinine on admission 1.86 and 2 on discharge    Procedures:  No admission procedures for hospital encounter.  ______________________________________________________________________  Discharge Medications:     Your Medication List        STOP taking these medications      levoFLOXacin  750 MG tablet  Commonly known as: LEVAQUIN             START taking these medications      amoxicillin -clavulanate 500-125 mg per tablet  Commonly known as: AUGMENTIN   Take 1 tablet by mouth two (2) times a day for 5 days.     doxycycline  100 MG tablet  Commonly known as: VIBRA -TABS  Take 1 tablet (100 mg total) by mouth two (2) times a day for 4 days.            CONTINUE taking these medications      ACCU-CHEK GUIDE TEST STRIPS Strp  Generic drug: blood sugar diagnostic  Use to check blood sugar daily     ACCU-CHEK SOFTCLIX LANCETS lancets  Generic drug: lancets  CHECK BLOOD SUGAR DAILY     acetaminophen  500 MG tablet  Commonly known as: TYLENOL   Take 2 tablets (1,000 mg total) by mouth Three (3) times a day as needed for pain.     ADVAIR  HFA 115-21 mcg/actuation inhaler  Generic drug: fluticasone  propion-salmeterol  INHALE 2 PUFFS TWICE A DAY     AEROCHAMBER MV inhaler  Generic drug: inhalational spacing device  1 spacer for inhaler     albuterol  2.5 mg /3 mL (0.083 %) nebulizer solution  Inhale 3 mL (2.5 mg total) by nebulization every six (6) hours as needed for wheezing.     albuterol  90 mcg/actuation inhaler  Commonly known as: PROVENTIL  HFA;VENTOLIN  HFA  Inhale 1 puff every six (6) hours as needed for wheezing.     amiodarone  200 MG tablet  Commonly known as: PACERONE   Take 1 tablet (200 mg total) by mouth daily.     AMVUTTRA  25 mg/0.5 mL injection  Generic drug: vutrisiran   Inject 0.5 mL (25 mg total) under the skin Take as directed. Every 6 Weeks     cranberry 500 mg Cap  Take 500 mg by mouth daily with evening meal.     cyclobenzaprine  10 MG tablet  Commonly known as: FLEXERIL   Take 1 tablet (10 mg total) by mouth two (2) times a day as needed for muscle spasms.     ergocalciferol -1,250 mcg (50,000 unit) 1,250 mcg (50,000 unit) capsule  Commonly known as: DRISDOL   Take 1 capsule (1,250 mcg total) by mouth every fourteen (14) days.     ferrous sulfate  325 (65 FE) MG tablet  Take 1 tablet (325 mg total) by mouth in the morning.     fosfomycin 3 gram Pack  Commonly known as: MONUROL   Take 3 g by mouth once a week.     gabapentin  100 MG capsule  Commonly known as: NEURONTIN   Take 1 capsule (100 mg total) by mouth two (2) times a day.     HYDROcodone -chlorpheniramine  polistirex 10-8 mg/5 mL ER suspension  Commonly known as: TUSSIONEX PENNKINETIC   Take 5 mL by mouth every twelve (12) hours as needed for cough. LIDOCAINE  PAIN RELIEF  4 % patch  Generic drug: lidocaine   Place 1 patch on the skin daily. Remove after 12 hours.     metOLazone  5 MG tablet  Commonly known as: ZAROXOLYN   Take 1 tablet (5 mg total) by mouth daily as needed (up to 3x weekly when instructed by cardiology clinic).     midodrine  5 MG tablet  Commonly known as: PROAMATINE   Take 1 tablet (5  mg total) by mouth three (3) times a day.     NARCAN 4 mg/actuation nasal spray  Generic drug: naloxone  1 spray into alternating nostrils once as needed (opioid overdose). PRN - Emergency use.     nystatin  100,000 unit/gram powder  Commonly known as: MYCOSTATIN   Apply to affected area 3 times daily     oxyCODONE  5 MG immediate release tablet  Commonly known as: ROXICODONE   Take 1 tablet (5 mg total) by mouth every eight (8) hours as needed for pain.     OXYGEN -AIR DELIVERY SYSTEMS MISC  4 L by Miscellaneous route continuous.     pantoprazole  20 MG tablet  Commonly known as: Protonix   TAKE 1 TABLET ONCE DAILY     rosuvastatin  5 MG tablet  Commonly known as: CRESTOR   TAKE 1 TABLET EVERY OTHER DAY     sodium chloride  3 % NEBULIZER solution  Inhale 4 mL by nebulization every six (6) hours as needed. To help raise mucus, use following the albuterol  nebulizer treatment, along with the Aerobika device     SPIRIVA  RESPIMAT 2.5 mcg/actuation inhalation mist  Generic drug: tiotropium bromide   Inhale 2 puffs daily.     spironolactone  25 MG tablet  Commonly known as: ALDACTONE   Take 0.5 tablets (12.5 mg total) by mouth daily.     torsemide  20 MG tablet  Commonly known as: DEMADEX   Take 3 tablets (60 mg total) by mouth two (2) times a day.     vitamin A -3,000 mcg RAE (10,000 UNIT) 3,000 mcg RAE (10,000 UNIT) capsule  Take 3,000 mcg of RAE by mouth daily.     VYNDAMAX  61 mg Cap  Generic drug: tafamidis   Take 1 capsule (61 mg) by mouth daily.              Allergies:  Nitrofurantoin and Lipitor  [atorvastatin ]  ______________________________________________________________________  Pending Test Results (if blank, then none):      Most Recent Labs:  All lab results last 24 hours -   Recent Results (from the past 24 hours)   Basic Metabolic Panel    Collection Time: 10/20/24  6:47 AM   Result Value Ref Range    Sodium 139 135 - 145 mmol/L    Potassium 4.4 3.4 - 4.8 mmol/L    Chloride 99 98 - 107 mmol/L    CO2 25.8 20.0 - 31.0 mmol/L    Anion Gap 14 5 - 14 mmol/L    BUN 51 (H) 9 - 23 mg/dL    Creatinine 7.91 (H) 0.55 - 1.02 mg/dL    BUN/Creatinine Ratio 25     eGFR [name]-EPI (2021) Female 23 (L) >=60 mL/min/1.89m2    Glucose 115 70 - 179 mg/dL    Calcium  9.4 8.7 - 10.4 mg/dL   CBC    Collection Time: 10/20/24  6:47 AM   Result Value Ref Range    WBC 16.7 (H) 3.6 - 11.2 10*9/L    RBC 3.78 (L) 3.95 - 5.13 10*12/L    HGB 11.4 11.3 - 14.9 g/dL    HCT 65.3 65.9 - 55.9 %    MCV 91.6 77.6 - 95.7 fL    MCH 30.3 25.9 - 32.4 pg    MCHC 33.1 32.0 - 36.0 g/dL    RDW 85.4 87.7 - 84.7 %    MPV 9.1 6.8 - 10.7 fL    Platelet 141 (L) 150 - 450 10*9/L   Magnesium  Level    Collection Time: 10/20/24  6:47 AM   Result  Value Ref Range    Magnesium  2.4 1.6 - 2.6 mg/dL   ECG 12 Lead    Collection Time: 10/20/24 11:12 AM   Result Value Ref Range    EKG Systolic BP  mmHg    EKG Diastolic BP  mmHg    EKG Ventricular Rate 71 BPM    EKG Atrial Rate 71 BPM    EKG P-R Interval 202 ms    EKG QRS Duration 118 ms    EKG Q-T Interval 458 ms    EKG QTC Calculation 497 ms    EKG Calculated P Axis 47 degrees    EKG Calculated R Axis 206 degrees    EKG Calculated T Axis 31 degrees    QTC Fredericia 484 ms       Relevant Studies/Radiology (if blank, then none):  ECG 12 Lead  Result Date: 10/20/2024  NORMAL SINUS RHYTHM LOW VOLTAGE QRS POSSIBLE ANTEROLATERAL INFARCT  (CITED ON OR BEFORE 06-Oct-2023) ABNORMAL ECG WHEN COMPARED WITH ECG OF 19-Oct-2024 07:21, SINUS RHYTHM HAS REPLACED ECTOPIC ATRIAL RHYTHM QRS AXIS SHIFTED LEFT QUESTIONABLE CHANGE IN INITIAL FORCES OF LATERAL LEADS NONSPECIFIC T WAVE ABNORMALITY NO LONGER EVIDENT IN LATERAL LEADS    ECG 12 Lead  Result Date: 10/19/2024  UNCLEAR RHYTHM CONSIDER JUNCTIONAL RHYTHM VERSUS ATRIAL FIBRILLATION LOW VOLTAGE QRS POSSIBLE ANTEROLATERAL INFARCT  , AGE UNDETERMINED ABNORMAL ECG WHEN COMPARED WITH ECG OF 18-Oct-2024 21:32, NO SIGNIFICANT CHANGE WAS FOUND Confirmed by Von Shawl [phone]) on 10/19/2024 5:50:14 PM    ECG 12 Lead  Result Date: 10/19/2024  ATRIAL FIBRILLATION WITH ACCELERATED JUNCTIONAL RHYTHM LOW VOLTAGE QRS POSSIBLE ANTEROLATERAL INFARCT  (CITED ON OR BEFORE 06-Oct-2023) ABNORMAL ECG WHEN COMPARED WITH ECG OF 30-Jul-2024 10:00, ATRIAL FIBRILATION AND  JUNCTIONAL RHYTHM HAS REPLACED SINUS RHYTHM NONSPECIFIC T WAVE ABNORMALITY NO LONGER EVIDENT IN INFERIOR LEADS NONSPECIFIC T WAVE ABNORMALITY NO LONGER EVIDENT IN ANTEROLATERAL LEADS Confirmed by Antonetta Gull ([id number]) on 10/19/2024 1:07:25 PM    ECG 12 Lead  Result Date: 10/19/2024  UNUSUAL P AXIS, POSSIBLE ECTOPIC ATRIAL RHYTHM LEFT AXIS DEVIATION LOW VOLTAGE QRS INFERIOR INFARCT , AGE UNDETERMINED POSSIBLE ANTEROLATERAL INFARCT  , AGE UNDETERMINED PROLONGED QT ABNORMAL ECG WHEN COMPARED WITH ECG OF 19-Oct-2024 01:02, ECTOPIC ATRIAL RHYTHM HAS REPLACED WIDE QRS RHYTHM    XR Chest 2 views  Result Date: 10/18/2024  EXAM: XR CHEST 2 VIEWS ACCESSION: [id number] UN REPORT DATE: 10/18/2024 10:07 PM CLINICAL INDICATION: CHEST PAIN ; Chest Pain  TECHNIQUE: PA and Lateral Chest Radiographs COMPARISON: 10/12/2024 FINDINGS: Increased diffuse interstitial prominence with peribronchial vascular cuffing bilaterally. New small left pleural effusion. No pneumothorax. Cardiac silhouette is unchanged in size, allowing for differences in patient's rotation.     Increased interstitial opacities with new small left pleural effusion, favored to represent worsening pulmonary edema given relatively rapid interval change. ______________________________________________________________________  Discharge Instructions:   Activity Instructions       Activity as tolerated              Diet Instructions       Discharge diet (specify)      Discharge Nutrition Therapy: Regular            Other Instructions       Call MD for:  difficulty breathing, headache or visual disturbances      Call MD for:  persistent dizziness or light-headedness      Call MD for:  persistent nausea or vomiting      Call MD for: Temperature > 38.5 Celsius ( > 101.3  Fahrenheit)      Discharge instructions      Discharge Instructions for Pneumonia        You have been diagnosed with pneumonia, a serious lung infection. Most cases of pneumonia are caused by bacteria. Pneumonia most often occurs in older adults, young children, and people with chronic health problems.      Home Care        Take your medication exactly as directed. Don t skip doses. Continue taking your antibiotics as directed until they are all gone even if you start to feel better. This will prevent the pneumonia from coming back.      Drink at least 8 glasses of water  daily, unless directed otherwise. This helps to loosen and thin secretions so that you can cough them up.      Use a cool-mist humidifier in your bedroom. Be sure to clean the humidifier daily.      Coughing up mucus is normal. Todd t use medications to suppress your cough unless your cough is dry, painful, or interferes with your sleep. You may use an expectorant if ordered by your doctor.      Learn percussion and postural drainage. These are techniques that can help you cough up extra mucus. This extra mucus can trap germs in your lungs. Ask your healthcare provider for instructions. Perform these techniques 3 times a day until your lungs are clear.      Warm compresses or a moist heating pad on the lowest setting can be used to relieve chest discomfort. Use several times a day for 15-20 minutes at a time. (To prevent injuring your skin, be sure the temperature of the compress or heating pad is warm, not hot.)      Get plenty of rest until your fever, shortness of breath, and chest pain go away.      Plan to get a flu shot every year.      Ask your doctor about a pneumonia vaccination.    When to Seek Medical Attention        Call 911 right away if you have any of the following:          Chest pain unrelated to coughing          Trouble breathing          Blue lips or fingernails      Otherwise, call your doctor if you have any of the following:          Fever above 101.5 F          Bloody sputum          Vomiting            Follow Up instructions and Outpatient Referrals     Ambulatory Referral to Home Health      Reason for referral: therapy    Is this a [facility] or [facility] Patient?: No    Physician to follow patient's care: PCP    Disciplines requested:  Physical Therapy  Occupational Therapy       Physical Therapy requested:  Home safety evaluation  Evaluate and treat       Occupational Therapy Requested:  Home safety evaluation  Evaluate and treat       Requested SOC Date:  Comment - within [facility]    Ambulatory Referral to Home Health      Reason for referral: Therapy    Is this a [facility] or [facility]  [place] Patient?: Yes    Do you want agency provider parameter notifications or patient specific   provider parameter notifications?: Agency    Do you want to initiate remote patient monitoring?: No    Physician to follow patient's care: PCP    Disciplines requested:  Physical Therapy  Occupational Therapy       Physical Therapy requested: Evaluate and treat    Occupational Therapy Requested: Evaluate and treat    Requested [facility] Date: 10/23/2024    Call MD for:  difficulty breathing, headache or visual disturbances      Call MD for:  persistent dizziness or light-headedness      Call MD for:  persistent nausea or vomiting      Call MD for: Temperature > 38.5 Celsius ( > 101.3 Fahrenheit)      Discharge instructions Appointments which have been scheduled for you      Nov 03, 2024 2:00 PM  (Arrive by 1:45 PM)  RETURN HEART FAILURE with Damien Brodie Lee, AGNP  [facility] CARDIOLOGY [place] [facility] REGION) [address]  [facility] 1 through 4  [place] [address]  [phone]        Nov 03, 2024 2:45 PM  (Arrive by 2:30 PM)  INFUSION ONLY with UNCTIF 01  [facility] THERAPEUTIC INFUSION CTR [place] [facility] REGION) [address]  [facility] 1 through 4  [place] [address]  [phone]        Dec 04, 2024 3:45 PM  (Arrive by 3:30 PM)  RETURN VIDEO VISIT MYCHART with Velia Corena Novak, MD  [facility] INTERNAL MEDICINE [place] [facility] REGION) [address]  [facility] 1 through 4  [place] [address]  [phone]   Please sign into My [facility] Chart at least 15 minutes before your appointment to complete the eCheck-In process. You must complete eCheck-In before you can start your video visit. We also recommend testing your audio and video connection to troubleshoot any issues before your visit begins. Click ???Join Video Visit??? to complete these checks. Once you have completed eCheck-In and tested your audio and video, click ???Join Call??? to connect to your visit.     For your video visit, you will need a computer with a working camera, speaker and microphone, a smartphone, or a tablet with internet access.    My [facility] Chart enables you to manage your health, send non-urgent messages to your provider, view your test results, schedule and manage appointments, and request prescription refills securely and conveniently from your computer or mobile device.    You can go to [email or web address] to sign in to your My [place] Chart account with your username and password. If you have forgotten your username or password, please choose the ???Forgot Username???? and/or ???Forgot Password???? links to gain access. You also can access your My [place] Chart account with the free MyChart mobile app for Android or iPhone.    If you need assistance accessing your My [place] Chart account or for assistance in reaching your provider's office to reschedule or cancel your appointment, please call [facility] [phone].         Dec 07, 2024 2:30 PM  (Arrive by 2:15 PM)  RETURN PULMONARY HYPERTENSION with Heron Jama Hitt, MD  [facility] PULMONARY SPECIALTY CL [place] [facility] REGION) [address]  [facility] 1 through 4  [place] [address]  [phone]  ______________________________________________________________________  Discharge Day Services:  BP 114/54  - Pulse 67  - Temp 36.8 ??C (98.2 ??F) (Oral)  - Resp 16  - Ht 160 cm (5' 3)  - Wt 75.8 kg (167 lb)  - LMP  (LMP Unknown)  - SpO2 96%  - BMI 29.58 kg/m??   Pt seen on the day of discharge and determined appropriate for discharge.    Condition at Discharge: good    Length of Discharge: I spent greater than 30 mins in the discharge of this patient.

## 2024-10-20 NOTE — Telephone Encounter (Signed)
 Copied from CRM [id number]. Topic: Other - Other  >> Oct 20, 2024  8:35 AM Arlys Sharps wrote:  [facility] has received an incoming clinical call:    Caller name: Ranlle   Best callback number: [phone] or [phone] [place]   Relationship to Patient: [facility]   Describe the reason for the call: Caller is calling to confirm provider will sign off on patient home health order once the patient is discharged from the hospital.

## 2024-10-20 NOTE — Plan of Care (Signed)
 Shift Summary  Pain in both feet increased and was managed with PRN oxyCODONE , resulting in pain reduction to 0/10 later in the shift.   Scheduled toileting, hourly visual checks, and bed safety interventions were maintained throughout the shift with no reported falls or injuries.   Infection prevention and aseptic technique were consistently implemented, and sepsis risk scores remained low and stable.   PRN medications for sleep, constipation, and cough were administered as needed.   Overall, comfort and safety goals were supported and infection prevention measures were maintained during the shift.    Optimal Comfort and Wellbeing: Pain in both feet increased to 8/10 during the shift but returned to 0/10 after oxyCODONE  was administered; sleep and constipation interventions were also provided as needed. Pain remained intermittent and aching, with no change in overall clinical progression.    Absence of Fall and Fall-Related Injury: Fall reduction strategies were consistently maintained, including hourly visual checks, side rails up, bed alarm activation, and scheduled toileting every two hours; no falls or injuries were documented.    Absence of Infection Signs and Symptoms: Aseptic technique and infection prevention measures were maintained throughout the shift, and sepsis risk scores remained low and stable with no notable upward trend.

## 2024-10-20 NOTE — Consults (Signed)
 Cardiology Consult Note  [facility] Cardiology Service    Requesting Attending Physician :  Marga Skeens, MD  Service Requesting Consult : HBR - HBR: Hospitalist Cardinal    Date of Service: October 20, 2024 8:04 AM   Code Status:   Orders Placed This Encounter   Procedures    Full Code     Standing Status:   Standing     Number of Occurrences:   1        ID: Barbara Huber is a 83 y.o. 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       Assessment/Plan:   # Acute on chronic diastolic heart failure exacerbation, decompensated I TTR amyloid   Acute on chronic [name] exacerbated by volume overload, likely due to dietary indiscretion and suboptimal diuresis with torsemide  and not taking metolazone  as needed. Baseline weight fluctuations with recent increase from 165-167 to 169 lbs. No current weight on file.  ProBNP elevated at 4681 and high-sensitivity troponin is uptrending currently at 75.  High-sensitivity troponin elevated likely secondary to demand ischemia in the setting of CHF exacerbation.  There is no concerns for ACS at this time as the patient is without any current chest pain or anginal symptoms.  CXR shows evidence of worsening pulmonary edema and recent fever concerning for possible community-acquired pneumonia.  Previous echocardiogram from 05/2024 shows normal LV systolic function with EF 60-65% and moderate pulmonary hypertension. Recommend continuing diuresis and GDMT as below.               - trend HS troponin until peak.   - Continue diuresis with IV Lasix  100 mg daily with a goal net output of -1.5.  - Administer metolazone  2.5 mg as needed to augment net output.   - obtain iron  panel   - (GDMT): Spironolactone  being held due to hyperkalemia.  SGLT2 inhibitor contraindicated due to recurrent UTIs.  - continue tafamidis  61 mg daily.   - strict I&O's.   - Monitor daily weights; preferably standing.   - Continue telemetry monitoring  - Community-acquired pneumonia management per primary team.     # Acute on chronic hypoxic respiratory failure  # CAP   Chronic hypoxic respiratory failure with acute exacerbation, likely secondary to volume overload from heart failure. Baseline oxygen  requirement is 3L nasal cannula. Current oxygenation status improved with diuresis but WBC elevated at 16.7 on today despite being on Iv antibiotics.               - CHF management as above.  - CAP management per primary team.          # Atrial fibrillation, chronic, paroxysmal   Currently in sinus rhythm with heart rate of 67 bpm.  She was previously on Eliquis  in the past but this was discontinued due to recurrent anemia and has not been interested in pursuing watchman  Patient currently not on anticoagulation.              - conservative management.       Recommendations given to the primary team via epic chat.   Thank you for consulting medicine Cardiology. This patient was seen and discussed  with the consult attending. Please see attending attestation for further insights into management. If any further questions or concerns arise please do not hesitate to contact us  via pager or epic chat.           Subjective:     24 hour events  No acute events overnight     The patient states she is feeling well today but continues to have a congested cough with minimal sputum production. She continues to be dyspneic and hypoxia requiring  She does report responded well to the IV Lasix  and having frequent urination.  She is without any chest pain or anginal like symptoms at this time. Her daughter remains at the bedside.     Allergies:   Allergies[1]    Medications: Scheduled Meds:Scheduled Medications[2]    Code Status:  Full Code    Review of Systems: 10 point review negative except for stated above    Objective:     Physical Exam:  VITAL SIGNS: Temp:  [36.8 ??C (98.2 ??F)-37.2 ??C (99 ??F)] 36.8 ??C (98.2 ??F)  Pulse:  [66-77] 67  SpO2 Pulse:  [65-82] 82  Resp:  [13-20] 16  BP: (108-135)/(40-54) 114/54  SpO2:  [93 %-98 %] 96 %    Physical Exam   Constitutional: She appears chronically ill.   HENT:   Nose: Nose normal. Mouth/Throat: Oropharynx is clear.   Eyes: Pupils are equal, round, and reactive to light. Conjunctivae are normal.   Neck: No JVD present.   Cardiovascular: Normal rate, regular rhythm, S1 normal and S2 normal.   Murmur heard.  Pulses:       Femoral pulses are 2+ on the right side and 2+ on the left side.  Pulmonary/Chest: Increased effort noted. She exhibits no tenderness.   Bibasilar crackles; chest expansion symmetrical, congested cough    Abdominal: Soft. Bowel sounds are normal. She exhibits no distension.   Musculoskeletal:         General: No edema. Normal range of motion.      Cervical back: Normal range of motion and neck supple.   Neurological: She is alert and oriented to person, place, and time.   Skin: Skin is warm and dry.        Test Results  ECG: 10/19/2024 at 0102 personally reviewed atrial fibrillation, poor R wave progression and left axis deviation with T wave inversions noted in aVR, inferior leads and anterior leads.     Echocardiogram: 05/2024  Summary    1. The left ventricle is normal in size with moderately increased wall  thickness.    2. The left ventricular systolic function is normal, LVEF is visually  estimated at 60-65%.    3. Mitral annular calcification is present (moderate).    4. The right ventricle is normal in size, with normal systolic function.    5. There is moderate pulmonary hypertension.    6. IVC size and inspiratory change suggest mildly elevated right atrial  pressure. (5-10 mmHg). CXR:10/18/2024  Impression  Increased interstitial opacities with new small left pleural effusion, favored to represent worsening pulmonary edema given relatively rapid interval change.        Right heart cath: 01/09/2021  Findings:  Elevated left and right sided filling pressures  Prominent Y-descent in right atrium  Preserved cardiac output  Right Heart Catheterization       Pressures   Right atrium Mean 23 mm Hg   Right ventricle Systolic 65 mm Hg, End-diastolic 12 mm Hg   Pulmonary artery  Systolic 85 mm Hg, Diastolic 40 mm Hg, Mean 55 mm Hg   Pulmonary capillary wedge Mean 32 mm Hg      Arterial saturation: 100% (presumed via oxymetry)  Mixed venous saturation: 57%     Fick cardiac output: 5.3 L/min  Fick cardiac index: 2.5 L/min/m^2     Thermal cardiac output: 5.4 L/min  Thermal cardiac index: 2.5 L/min/m^2     Systemic vascular resistance (SVR): 1244 dynes*s/cm^5  Pulmonary vascular resistance (PVR): 4.2 Wood units        NM Spect CT singe: 10/2020  Impression  Grade 3. ATTR positive.  Prominent mediastinal lymph nodes as noted above.        Nuclear Stress Test:none on file   Cardiac Cath:none on file   Labs:   Lab Results   Component Value Date    CKTOTAL 27.0 (L) 07/03/2024    TROPONINI 75 (HH) 10/19/2024    TROPONINI 49 (HH) 10/18/2024    TROPONINI 42 (HH) 10/18/2024     Results for orders placed or performed during the hospital encounter of 10/18/24   RAPID INFLUENZA/RSV/COVID PCR    Specimen: Nasopharyngeal Swab   Result Value Ref Range    SARS-CoV-2 PCR Negative Negative    Influenza A Negative Negative    Influenza B Negative Negative    RSV Negative Negative   Urine Culture    Specimen: Clean Catch; Urine   Result Value Ref Range    Urine Culture, Comprehensive (A)      Mixed Gram Positive/Gram Negative Organisms Isolated   Respiratory Pathogen Panel   Result Value Ref Range    Adenovirus Not Detected Not Detected    Coronavirus HKU1 Not Detected Not Detected    Coronavirus NL63 Not Detected Not Detected    Coronavirus 229E Not Detected Not Detected    Coronavirus OC43 PCR Not Detected Not Detected    Metapneumovirus Not Detected Not Detected    Rhinovirus/Enterovirus Not Detected Not Detected    Influenza A Not Detected Not Detected    Influenza B Not Detected Not Detected    Parainfluenza 1 Not Detected Not Detected    Parainfluenza 2 Not Detected Not Detected    Parainfluenza 3 Not Detected Not Detected    Parainfluenza 4 Not Detected Not Detected    RSV Not Detected Not Detected    Chlamydophila (Chlamydia) pneumoniae Not Detected Not Detected    Mycoplasma pneumoniae Not Detected Not Detected    SARS-CoV-2 PCR Not Detected Not Detected   Comprehensive Metabolic Panel   Result Value Ref Range    Sodium 139 135 - 145 mmol/L    Potassium 5.3 (H) 3.4 - 4.8 mmol/L    Chloride 99 98 - 107 mmol/L    CO2 22.5 20.0 - 31.0 mmol/L    Anion Gap 18 (H) 5 - 14 mmol/L    BUN 45 (H) 9 - 23 mg/dL    Creatinine 8.13 (H) 0.55 - 1.02 mg/dL    BUN/Creatinine Ratio 24     eGFR [name]-EPI (2021) Female 27 (L) >=60 mL/min/1.88m2    Glucose 156 70 - 179 mg/dL    Calcium  9.6 8.7 - 10.4 mg/dL    Albumin 3.5 3.4 - 5.0 g/dL    Total Protein 8.3 (H) 5.7 - 8.2 g/dL    Total Bilirubin 0.5 0.3 - 1.2 mg/dL    AST 30 <=65 U/L    ALT <7 (L) 10 - 49 U/L    Alkaline Phosphatase 113 46 - 116 U/L  PT-INR   Result Value Ref Range    PT 11.9 9.9 - 12.6 sec    INR 1.04    hsTroponin I (serial 0-2-6H w/ delta)   Result Value Ref Range    hsTroponin I 42 (HH) <=34 ng/L   hsTroponin I - 2 Hour   Result Value Ref Range    hsTroponin I 49 (HH) <=34 ng/L    delta hsTroponin I 7 <=7 ng/L   Pro-BNP   Result Value Ref Range    PRO-BNP 4,681.0 (H) <=300.0 pg/mL   CBC   Result Value Ref Range    WBC 14.7 (H) 3.6 - 11.2 10*9/L    RBC 3.67 (L) 3.95 - 5.13 10*12/L    HGB 11.2 (L) 11.3 - 14.9 g/dL    HCT 66.2 (L) 65.9 - 44.0 %    MCV 91.9 77.6 - 95.7 fL    MCH 30.5 25.9 - 32.4 pg    MCHC 33.2 32.0 - 36.0 g/dL    RDW 85.3 87.7 - 84.7 %    MPV 9.0 6.8 - 10.7 fL Platelet 151 150 - 450 10*9/L   Basic metabolic panel   Result Value Ref Range    Sodium 138 135 - 145 mmol/L    Potassium 4.6 3.4 - 4.8 mmol/L    Chloride 100 98 - 107 mmol/L    CO2 23.5 20.0 - 31.0 mmol/L    Anion Gap 15 (H) 5 - 14 mmol/L    BUN 55 (H) 9 - 23 mg/dL    Creatinine 7.90 (H) 0.55 - 1.02 mg/dL    BUN/Creatinine Ratio 26     eGFR [name]-EPI (2021) Female 23 (L) >=60 mL/min/1.27m2    Glucose 180 (H) 70 - 179 mg/dL    Calcium  8.8 8.7 - 10.4 mg/dL   Magnesium  Level   Result Value Ref Range    Magnesium  2.4 1.6 - 2.6 mg/dL   hsTroponin I (single, no delta)   Result Value Ref Range    hsTroponin I 75 (HH) <=34 ng/L   Basic Metabolic Panel   Result Value Ref Range    Sodium 139 135 - 145 mmol/L    Potassium 4.4 3.4 - 4.8 mmol/L    Chloride 99 98 - 107 mmol/L    CO2 25.8 20.0 - 31.0 mmol/L    Anion Gap 14 5 - 14 mmol/L    BUN 51 (H) 9 - 23 mg/dL    Creatinine 7.91 (H) 0.55 - 1.02 mg/dL    BUN/Creatinine Ratio 25     eGFR [name]-EPI (2021) Female 23 (L) >=60 mL/min/1.58m2    Glucose 115 70 - 179 mg/dL    Calcium  9.4 8.7 - 10.4 mg/dL   CBC   Result Value Ref Range    WBC 16.7 (H) 3.6 - 11.2 10*9/L    RBC 3.78 (L) 3.95 - 5.13 10*12/L    HGB 11.4 11.3 - 14.9 g/dL    HCT 65.3 65.9 - 55.9 %    MCV 91.6 77.6 - 95.7 fL    MCH 30.3 25.9 - 32.4 pg    MCHC 33.1 32.0 - 36.0 g/dL    RDW 85.4 87.7 - 84.7 %    MPV 9.1 6.8 - 10.7 fL    Platelet 141 (L) 150 - 450 10*9/L   Magnesium  Level   Result Value Ref Range    Magnesium  2.4 1.6 - 2.6 mg/dL   ECG 12 Lead   Result Value Ref Range    EKG Systolic BP  mmHg    EKG Diastolic BP  mmHg    EKG Ventricular Rate 92 BPM    EKG Atrial Rate 100 BPM    EKG P-R Interval  ms    EKG QRS Duration 114 ms    EKG Q-T Interval 364 ms    EKG QTC Calculation 450 ms    EKG Calculated P Axis  degrees    EKG Calculated R Axis 203 degrees    EKG Calculated T Axis 66 degrees    QTC Fredericia 419 ms   ECG 12 Lead   Result Value Ref Range    EKG Systolic BP  mmHg    EKG Diastolic BP  mmHg    EKG Ventricular Rate 83 BPM    EKG Atrial Rate  BPM    EKG P-R Interval  ms    EKG QRS Duration 120 ms    EKG Q-T Interval 424 ms    EKG QTC Calculation 498 ms    EKG Calculated P Axis  degrees    EKG Calculated R Axis 268 degrees    EKG Calculated T Axis 67 degrees    QTC Fredericia 472 ms   ECG 12 Lead   Result Value Ref Range    EKG Systolic BP  mmHg    EKG Diastolic BP  mmHg    EKG Ventricular Rate 68 BPM    EKG Atrial Rate 68 BPM    EKG P-R Interval 216 ms    EKG QRS Duration 118 ms    EKG Q-T Interval 486 ms    EKG QTC Calculation 516 ms    EKG Calculated P Axis 134 degrees    EKG Calculated R Axis -57 degrees    EKG Calculated T Axis 116 degrees    QTC Fredericia 507 ms   ECG 12 Lead   Result Value Ref Range    EKG Systolic BP  mmHg    EKG Diastolic BP  mmHg    EKG Ventricular Rate 71 BPM    EKG Atrial Rate 71 BPM    EKG P-R Interval 202 ms    EKG QRS Duration 118 ms    EKG Q-T Interval 458 ms    EKG QTC Calculation 497 ms    EKG Calculated P Axis 47 degrees    EKG Calculated R Axis 206 degrees    EKG Calculated T Axis 31 degrees    QTC Fredericia 484 ms   CBC w/ Differential   Result Value Ref Range    WBC 11.7 (H) 3.6 - 11.2 10*9/L    RBC 4.30 3.95 - 5.13 10*12/L    HGB 13.0 11.3 - 14.9 g/dL    HCT 60.1 65.9 - 55.9 %    MCV 92.5 77.6 - 95.7 fL    MCH 30.2 25.9 - 32.4 pg    MCHC 32.6 32.0 - 36.0 g/dL    RDW 85.0 87.7 - 84.7 %    MPV 8.8 6.8 - 10.7 fL    Platelet 177 150 - 450 10*9/L    nRBC 0 <=4 /100 WBCs    Neutrophils % 80.1 %    Lymphocytes % 8.4 %    Monocytes % 9.9 %    Eosinophils % 1.2 %    Basophils % 0.4 %    Absolute Neutrophils 9.4 (H) 1.8 - 7.8 10*9/L    Absolute Lymphocytes 1.0 (L) 1.1 - 3.6 10*9/L    Absolute Monocytes 1.2 (H) 0.3 - 0.8 10*9/L    Absolute  Eosinophils 0.1 0.0 - 0.5 10*9/L    Absolute Basophils 0.0 0.0 - 0.1 10*9/L   Urinalysis with Microscopy with Culture Reflex   Result Value Ref Range    Color, UA Colorless     Clarity, UA Clear     Specific Gravity, UA 1.010 1.003 - 1.030    pH, UA 5.0 5.0 - 9.0    Leukocyte Esterase, UA Moderate (A) Negative    Nitrite, UA Negative Negative    Protein, UA Negative Negative    Glucose, UA Negative Negative    Ketones, UA Negative Negative    Urobilinogen, UA <2.0 mg/dL <7.9 mg/dL    Bilirubin, UA Negative Negative    Blood, UA Trace (A) Negative    RBC, UA 7 (H) <=4 /HPF    WBC, UA 4 0 - 5 /HPF    Squam Epithel, UA 1 0 - 5 /HPF    Bacteria, UA Moderate (A) None Seen /HPF    Hyaline Casts, UA 4 (H) 0 - 1 /LPF    Mucus, UA Rare (A) None Seen /HPF     Lab Results   Component Value Date    PLT 141 (L) 10/20/2024       Lab Results   Component Value Date    INR 1.04 10/18/2024     Lab Results   Component Value Date    PROBNP 4,681.0 (H) 10/18/2024       I personally spent 25 minutes face-to-face and non-face-to-face in the care of this patient, which includes all pre, intra, and post visit time on the date of service; reviewing EMR, patient physical exam, reviewing results, patient/family education, communication with primary team, coordinating outpatient follow up/services and  all related documentation.  All documented time was specific to the E/M visit and does not include any procedures that may have been performed.    Raquon Milledge, ACNP-AG  October 20, 2024 8:04 AM  Division of Cardiology   Cardiovascular Medicine         Disclaimer: Nuance Dragon voice recognition software was used to create portions of this document.  An attempt at proofreading has been made to minimize errors.  If, however, the reader notes inconsistent information and/or needs to ask questions concerning any part of the document, please contact me for clarification.  Occasional interpretation errors may inadvertently occur due to limitations in the software.          [1]   Allergies  Allergen Reactions    Nitrofurantoin Anaphylaxis and Other (See Comments)    Lipitor  [Atorvastatin ] Muscle Pain     SOB, Headache, fatigue, sick on my stomach   [2]    amiodarone   200 mg Oral Daily cefTRIAXone   1 g Intravenous Q24H    doxycycline   100 mg Oral BID    ferrous sulfate   325 mg Oral Daily    fluticasone  furoate-vilanterol  1 puff Inhalation Daily (RT)    gabapentin   100 mg Oral BID    heparin  (porcine) for subcutaneous use  5,000 Units Subcutaneous Q8H SCH    midodrine   5 mg Oral TID    pantoprazole   20 mg Oral Daily    tafamidis   61 mg Oral Daily    umeclidinium  1 puff Inhalation Daily (RT)

## 2024-10-23 DIAGNOSIS — Z09 Encounter for follow-up examination after completed treatment for conditions other than malignant neoplasm: Principal | ICD-10-CM

## 2024-10-24 ENCOUNTER — Encounter: Admit: 2024-10-24 | Discharge: 2024-11-14 | Payer: Medicare (Managed Care)

## 2024-10-24 ENCOUNTER — Inpatient Hospital Stay: Admit: 2024-10-24 | Payer: Medicare (Managed Care)

## 2024-10-24 ENCOUNTER — Encounter: Admit: 2024-10-24 | Payer: Medicare (Managed Care)

## 2024-10-25 MED ORDER — ONDANSETRON 4 MG DISINTEGRATING TABLET
ORAL_TABLET | Freq: Two times a day (BID) | 0 refills | 14.00000 days | Status: CP | PRN
Start: 2024-10-25 — End: 2024-11-08

## 2024-10-26 DIAGNOSIS — B3731 Vaginal candidiasis: Principal | ICD-10-CM

## 2024-10-26 MED ORDER — FLUCONAZOLE 150 MG TABLET
ORAL_TABLET | Freq: Once | ORAL | 0 refills | 2.00000 days | Status: CP
Start: 2024-10-26 — End: 2024-10-26

## 2024-10-30 MED ORDER — ACCU-CHEK GUIDE TEST STRIPS
ORAL_STRIP | SUBCUTANEOUS | 6 refills | 0.00000 days | Status: CP
Start: 2024-10-30 — End: ?

## 2024-10-30 NOTE — Progress Notes (Signed)
 [facility] Refill Coordination Note    Specialty Medication(s) to be Shipped:   Cardiology: Vyndamax     Other medication(s) to be shipped: No additional medications requested for fill at this time    Specialty Medications not needed at this time: N/A     Barbara Huber, DOB: June 14, 1941  Phone: [phone] (home)       All above HIPAA information was verified with patient.     Was a nurse, learning disability used for this call? No    Completed refill call assessment today to schedule patient's medication shipment from the [facility]  [phone]).  All relevant notes have been reviewed.     Specialty medication(s) and dose(s) confirmed: Regimen is correct and unchanged.   Changes to medications: Mykaila reports no changes at this time.  Changes to insurance: No  New side effects reported not previously addressed with a pharmacist or physician: None reported  Questions for the pharmacist: No    Confirmed patient received a Conservation Officer, Historic Buildings and a Surveyor, Mining with first shipment. The patient will receive a drug information handout for each medication shipped and additional FDA Medication Guides as required.       DISEASE/MEDICATION-SPECIFIC INFORMATION        N/A    SPECIALTY MEDICATION ADHERENCE     Medication Adherence    Patient reported X missed doses in the last month: 0  Specialty Medication: Vyndamax  61mg   Patient is on additional specialty medications: No              Were doses missed due to medication being on hold? No    Vyndamax  61 mg: 10 days of medicine on hand     REFERRAL TO PHARMACIST     Referral to the pharmacist: Not needed      [facility]     Shipping address confirmed in Epic.     Cost and Payment: Patient has a $0 copay, payment information is not required.    Delivery Scheduled: Yes, Expected medication delivery date: 11/06/24.     Medication will be delivered via Same Day Courier to the prescription address in Epic WAM.    Barbara Huber, [facility]   [facility]  Specialty Pharmacist

## 2024-11-03 ENCOUNTER — Ambulatory Visit
Admit: 2024-11-03 | Discharge: 2024-11-03 | Payer: Medicare (Managed Care) | Attending: Adult Health | Primary: Adult Health

## 2024-11-03 ENCOUNTER — Encounter: Admit: 2024-11-03 | Discharge: 2024-11-03 | Payer: Medicare (Managed Care)

## 2024-11-03 DIAGNOSIS — I5033 Acute on chronic diastolic (congestive) heart failure: Principal | ICD-10-CM

## 2024-11-03 DIAGNOSIS — R7989 Other specified abnormal findings of blood chemistry: Principal | ICD-10-CM

## 2024-11-03 DIAGNOSIS — Z79899 Other long term (current) drug therapy: Principal | ICD-10-CM

## 2024-11-03 DIAGNOSIS — E852 Heredofamilial amyloidosis, unspecified: Principal | ICD-10-CM

## 2024-11-03 DIAGNOSIS — I43 Cardiomyopathy in diseases classified elsewhere: Principal | ICD-10-CM

## 2024-11-03 DIAGNOSIS — E854 Organ-limited amyloidosis: Principal | ICD-10-CM

## 2024-11-03 DIAGNOSIS — I5032 Chronic diastolic (congestive) heart failure: Principal | ICD-10-CM

## 2024-11-03 LAB — BASIC METABOLIC PANEL
ANION GAP: 15 mmol/L — ABNORMAL HIGH (ref 5–14)
BLOOD UREA NITROGEN: 70 mg/dL — ABNORMAL HIGH (ref 9–23)
BUN / CREAT RATIO: 34
CALCIUM: 9.3 mg/dL (ref 8.7–10.4)
CHLORIDE: 99 mmol/L (ref 98–107)
CO2: 25.3 mmol/L (ref 20.0–31.0)
CREATININE: 2.08 mg/dL — ABNORMAL HIGH (ref 0.55–1.02)
EGFR CKD-EPI (2021) FEMALE: 23 mL/min/1.73m2 — ABNORMAL LOW (ref >=60)
GLUCOSE RANDOM: 105 mg/dL (ref 70–179)
POTASSIUM: 4.2 mmol/L (ref 3.4–4.8)
SODIUM: 139 mmol/L (ref 135–145)

## 2024-11-03 LAB — TSH: THYROID STIMULATING HORMONE: 7.386 u[IU]/mL — ABNORMAL HIGH (ref 0.550–4.780)

## 2024-11-03 LAB — PRO-BNP: PRO-BNP: 3261 pg/mL — ABNORMAL HIGH (ref <=300.0)

## 2024-11-03 MED ORDER — POTASSIUM CHLORIDE ER 20 MEQ TABLET,EXTENDED RELEASE(PART/CRYST)
ORAL_TABLET | Freq: Every day | ORAL | 3 refills | 90.00000 days
Start: 2024-11-03 — End: 2025-11-03

## 2024-11-03 MED ORDER — TORSEMIDE 20 MG TABLET
ORAL_TABLET | ORAL | 11 refills | 30.00000 days | Status: CP
Start: 2024-11-03 — End: 2025-11-03

## 2024-11-03 MED ADMIN — vutrisiran (AMVUTTRA) syringe 25 mg: 25 mg | SUBCUTANEOUS | @ 20:00:00 | Stop: 2024-11-03

## 2024-11-03 NOTE — Patient Instructions (Addendum)
-   try increasing torsemide  to 80mg  in the morning, 60mg  in the afternoon  - let me know when the injection is next - I will see you same day              Thank you for choosing [facility] Cardiology for your healthcare needs.    We are committed to delivering exceptional care and continuously improving our services. After your visit, you may receive a brief survey by text or email regarding your experience.    We invite you to share your thoughts about the care you received today and you???d like to recognize a team member who made your visit special, your feedback is truly appreciated.    Provider Name: Damien MARLA Lee, AGNP   Care Partner Name: Nidia JONETTA Dirks, LPN    When the patient survey arrives, we hope you???ll take a few moments to complete it. Your insights help us  grow and continue to maintain a five star experience for every patient.

## 2024-11-03 NOTE — Progress Notes (Signed)
 Patient presents for Amvuttra  injection.  VSS. Education material given to patient and educated on potential side effects, patient verbalizes understanding.      1506 Amvuttra  25 mg administered subcutaneously to L arm, band-aid applied to site.  Patient tolerated with no issues.  Patient discharged from infusion clinic with 12 week appt.

## 2024-11-03 NOTE — Progress Notes (Signed)
 [facility] Note    Referring Provider: [name], MD  [phone] [facility] [address] [place],  [place] [address]   Primary Provider: [name], MD  [address]  [place] [address]   Other Providers:  Dr Barbara Huber, Dr Barbara Huber    Reason for Visit:  Barbara Huber is a 83 y.o. female being seen for routine visit and continued care of cardiac amyloidosis .    Assessment & Plan:  1. 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 SPEP/UPEP/free light chains without concern. 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 Daughter is informed and has seen cardiology Barbara Huber) but has not had genetic testing. We discussed this today, ordered test kit through [facility] for pt's daughter Barbara Huber to complete testing. Discussed risks/benefits of testing.   - Continue Amvuttra  25mg  q38mos (started April 2024). Continue vitamin A  supplementation.   - Continue Tafamidis  61mg  daily and Amvuttra  q3mos. We have discussed possibly switch to Attruby on multiple occasions. She doesn't wish to change therapies at this time.  - spironolactone  12.5mg  daily, KCl 20mEq daily, midodrine  5mg  TID   - DC'd Jardiance  10mg  daily due to recurrent UTIs  - She has mild LE edema and mildly elevated JVP; increase torsemide  to 80mg  qAM, 60mg  qPM (from 60mg  BID)  - metolazone  PRN  - She has not been interested in CardioMEMS  1b. Goals of Care  - Had a discussion at prior visit w/ pt and daughter Barbara Huber regarding pt's goals of care.   We discussed role of palliative care (has seen Dr Blondie) and hospice (when appropriate). She may wish to return to hospital if needed, but likely not ICU level care or invasive monitoring.    2. Persistent atrial fibrillation  - s/p DCCV on 11/17/23 and again 03/20/24  - CHA2DS2-VASc score = 5 (1-gender, 2-Age, 1-DM, 1-HTN)  - We had previously stopped Eliquis  given recurrent anemia, shared decision making w/ pt and daughter. She was not interested in [place] at the time.   - Eliquis  2.5mg  BID restarted during hospitalization - with subsequent decrease in hemoglobin, thus stopped again in mid July 25'  - On amiodarone  200mg  daily  - Mild hyperthyroid being managed by PCP/methimazole   - For recurrent AF, she wouldn't want invasive procedures (like PPM and AVN ablation)    3. [name]  - Cr baseline around 2.3-3.0  - most recent Cr 2.08    4. COPD, OSA  - home O2 3-4L  - followed by pulm  - on Advair  and Spiriva   - Uses trilogy at night     5. Iron  deficiency anemia  - s/p IV iron  while inpatient in 07/2024  - as above, off Eliquis   Lab Results   Component Value Date    FERRITIN 79.0 07/29/2024     Lab Results   Component Value Date    HGB 11.4 10/20/2024     6. Anxiety  - previously on Zoloft   - Tried Remeron , but discontinued in setting of worsening HF symptoms    7. DM  - Off Insulin  with A1c <7  - Followed by PCP  Lab Results   Component Value Date    A1C 4.6 (L) 09/14/2024     8. Chronic back pain  - follows with pain clinic, on oxycodone   - gabapentin  BID PRN   - Continue flexeril  10mg  PRN nightly, has been tolerating this dose    Follow-up:  Return in about 3 months (around 02/03/2025) for Return HF.     History of Present Illness:  Barbara Huber is a 83 y.o. female with PMHx of COPD on 3L home [place], CHF, [name], [name], [name] She was initially admitted with decompensated [name] in 2021 in setting of  Septic Shock d/t Gangrenous Cholecystitis s/p cholecystectomy. She underwent further evaluation with PYP scan which showed TTR amyloid. She has since been followed in clinic for management of her [name]. In 2022, had numerous admissions for weakness/urosepsis, shortness of breath/orthopnea/weight gain, lightheadedness/SOB/Blurry vision found to be anemic.    Summary of most recent visits:  - 01/16/22 [facility]): Feeling well from a cardiac standpoint. Started Zoloft  for anxiety  - 02/13/22 [facility]): Weight 210-211. Feeling well. Taking extra torsemide  20mg  about every other day, which was stopped for elevated Cr.   - 03/13/22 ([name]): Weight 201-216, took metolazone  PRN, weight stabilized at 210lbs. Considering cardioMEMS.   - 05/13/22 ([name]): Had 3 iron  infusions. Doing well w/ volume, weight 211-212lbs. Cooking, staying active around house.   - 05/21/22 ([name]): CP/SOB. Weight 217 from 213. She took metolazone  x1 dose.   - 05/22/22 ([name]): Ed follow up. Back down to 213lbs. Still having CP w/ certain movement.   - 06/05/22 ([name]): Weight 214-216lb, should be 212lb.   - 06/09/22, 06/11/22, 06/22/22 ([name]): Weight down from 217 to 213lb, 211lb at home. Feeling well by 6/26.   - 07/15/22 ([name]): Switched inhaler back to Advair /Spiriva  with improvement in breathing and O2 sat. Weight 207lbs, feeling more like herself.   - 08/19/22 ([name]): Feeling well. Weight 208-210lbs.   - 10/22/22 ([name]): Feeling well. Weight has been 210-214lbs.   - 12/04/22 [facility]): doing well overall, weight a bit up so taking extra torsemide .   - 02/10/23 ([name]): Weight 213-214lbs, feeling well, eating a lot. Had taken metolazone  earlier in the week.   - 02/23/23 [facility]): Hospital follow up after mechanical fall with nondisplaced fractures of the left superior and inferior pubic rami. Taking torsemide  40mg , weight 211.   - 04/14/23 ([name]): Weight 210-212. 1st Amvuttra   - 07/14/23 ([name]): Weight 211-212 on torsemide  60mg  daily. 2nd Amvuttra .   - 08/18/23-08/23/23 (Admission): fatigue, weakness, DOE, recent prednisone  course for gout. Admitted to [facility], poor response to IV Lasix  and was changed to IV Bumex  3mg  BID and metolazone . Spiro held for low BP. Cr increased to 4.0 with diuresis. She went back to the ER on 8/24 for abdominal pain. She was felt to be euvolemic at a weight of 207lb and IV diuretics were held. She was discharged on torsemide  60mg  daily on 8/26.   - 08/27/23 [facility]): hospital follow up. Felt to be dry for weight down to 204 at home, Cr 3.4 BUN 96. Changed torsemide  from 60 daily to 40/20.   - 08/30/23-09/02/23 (Admission): Admitted for fatigue and SOB. Diuresed  with improvement in dyspnea. Weight on discharge was 204.   - 09/06/23 [facility]): Hospital follow up. Her weight continued to decrease on her scale by about 1lb per day. Weight 201 that morning. She was a little lightheaded and felt weak. Continued on torsemide  60mg  daily.  Cr 2.61,  pro-BNP 4575  - 09/08/23 ([name]/Phone): Weight 201, breathing well.   - 09/16/23 ([facility]): Weight 205, not peeing much with torsemide  60mg . Feeling full in belly. Cr 3.11,  pro-BNP 14492. Metolazone  and extra 40 torsemide .   - 09/17/23 ([facility]): Weight 203, feeling better. Continue torsemide  60/40 through weekend.   - 09/23/23 ([name]): n 9/25 reporting hat she didn't pee much the night before and weight was 203lb that morning. Increased by 2 lbs overnight. Instructed to take metolazone  2.5mg  and extra 40 torsemide  in the afternoon. Today her weight was up and so she requested a visit. She didn't urinate much and this morning was 205lbs. Extra torsemide  60mg  then metolazone  in morning and return for diuresis clinic.   - 09/24/23 (Diuresis): IV Lasix . Arranged for direct admission.   - 9/27-10/5 (Admission): She initially underwent inotrope assisted diuresis with dopamine  and furosemide  gtt, which was stopped on 9/30 after episodes of hypotension requiring NE and midodrine . Norepinephrine  stopped and pulmonary wedge pressure was 12 prior to pulling swan on on 10/1. Midodrine  maintained to maximize preload. Furosemide  gtt transitioned to spot IV lasix  and then was transitioned to home torsemide  60mg  daily on 10/2. Palliative care was consulted and family decided on outpatient evaluation. She was discharged with a pro-BNP of 17K down from 26K and weight of 209lb. Discharged with new home regimen torsemide  60mg  daily, metolazone  prn per clinic, midodrine  5mg  TID, Tafamidis  61mg  daily. She was continued on chronic home amiodarone  200 mg daily for atrial fibrillation and restarted on Eliquis  2.5mg  BID.   - 10/9/242 [facility]): hospital follow up. Weight 193-195. Torsemide  60mg g daily. In AF RVR by EKG. Amio increased to 200mg  BID.   - 10/27/23 ([name]): Weight up to 201 but had been 196-197.  - 11/11/23 [facility]): Hospital follow up, [name] for hypoxia 2/2 malfunction of portable O2. Weight 194 on torsemide  60mg .  - 11/17/23 (EP Procedure): Cardioversion  - 12/15/23 ([name]): Weight 186-189 on torsemide  60mg . Pro-BNP up. Increased torsemide  to 80mg .  - 01/26/24 ([name]): Weight as low as 181 on torsemide  80mg . No med changes.   - 03/13/24 ([name]): in AF. Scheduled for cardioversion.   - 04/26/24 [facility]): doing well, weight stable 175-176 on torsemide  80 daily. Remained in NSR. Had started methimazole  for hyperthyroid.  - 7/8-07/05/24 (Admission): weakness/dehydration  - 8/2-07/31/24 (Admission): [name] exacerbation. 169lb at discharge.   - 08/02/24 ([name]): follow up after admission torsemide  inc to 60 BID.     Interval history  Today she presents for follow up. She was admitted since our last visit, from 10/22-10/24/25 for  shortness of breath likely multifactorial with contributions from volume overload from [name] exacerbation, pneumonia (as patient with fever in ED), and chronic hypoxic respiratory failure from COPD.  RRP negative on admission. Received 160 mg IV lasix  in ED followed by Lasix  100 mg IV daily x 2 days.  For her pneumonia she was treated with ceftriaxone  and doxycycline  for CAP coverage and subsequently discharged on Augmentin  and Doxycycline . Her home spironolactone  was held initially given hyperkalemia but resumed with resolution of her hyperkalemia.  She was  continued on her midodrine  and home tafamidis . She has been doing pretty well since discharge. Her cough/congestion are improved. Per daughter, voice seems a little raspy. She has not been SOB. She has been taking 60 BID of torsemide , sometimes an extra 20mg . No metolazone . Sometimes takes just 100mg  if she thinks she's dried out. She does have a little swelling in her ankles. Has sometimes looked and felt bloated, but not  today.    Cardiovascular History & Procedures:    Cath / PCI:  none    CV Surgery:   none    EP Procedures and Devices:  03/20/24 - DCCV  11/17/23 - DCCV  01/10/21 - DCCV    Non-Invasive Evaluation(s):    Echo:  06/14/24  1. The left ventricle is normal in size with moderately increased wall  thickness.    2. The left ventricular systolic function is normal, LVEF is visually  estimated at 60-65%.    3. Mitral annular calcification is present (moderate).    4. The right ventricle is normal in size, with normal systolic function.    5. There is moderate pulmonary hypertension.    6. IVC size and inspiratory change suggest mildly elevated right atrial  pressure. (5-10 mmHg).    08/19/23  1. The left ventricle is normal in size with moderately increased wall  thickness.    2. 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    3. The left ventricular systolic function is hyperdynamic, LVEF is visually  estimated at >70%.    4. Mitral annular calcification is present (moderate).    5. The mitral valve leaflets are mildly thickened with normal leaflet  mobility.    6. The aortic valve is trileaflet with mildly thickened leaflets with normal  excursion.    7. The left atrium is moderately dilated in size.    8. The right ventricle is normal in size, with normal systolic function.    02/17/23    1. The left ventricle is normal in size with moderately increased wall  thickness.    2. The left ventricular systolic function is hyperdynamic, LVEF is visually  estimated at >70%.    3. The left atrium is moderately dilated in size.    4. The right ventricle is normal in size, with normal systolic function.    5. There is mild pulmonary hypertension.    12/09/21  Summary    1. The left ventricle is normal in size with severely increased wall  thickness.    2. The left ventricular systolic function is hyperdynamic, LVEF is visually  estimated at >70%.    3. There is grade III diastolic dysfunction (severely elevated filling  pressure).    4. The left atrium is moderately to severely dilated in size.    5. The right ventricle is mildly dilated in size, with normal systolic  function.    6. The right atrium is mildly dilated  in size.    7. IVC size and inspiratory change suggest elevated right atrial pressure.  (10-20 mmHg).    04/24/21   1. The left ventricle is normal in size with mildly increased wall  thickness.    2. The left ventricular systolic function is normal, LVEF is visually  estimated at > 55%.    3. There is grade II diastolic dysfunction (elevated filling pressure).    4. Mitral annular calcification is present (mild).    5. The left atrium is mildly dilated in size.    6. The right ventricle is mildly dilated in size, with low normal systolic  function.    7. IVC size and inspiratory change suggest elevated right atrial pressure.  (10-20 mmHg).    09/30/20  Summary    1. Limited study to assess systolic function.    2. IVC size and inspiratory change suggest normal right atrial pressure.  (0-5 mmHg).    3. The  left ventricle is normal in size with mildly to moderately increased  wall thickness.    4. The left ventricular systolic function is normal with no obvious wall  motion abnormalities, LVEF is visually estimated at > 55%.    5. Mitral annular calcification is present.    6. The left atrium is mildly dilated in size.    7. The right ventricle is upper normal in size, with reduced systolic  Function.    Cardiac CT/MRI/Nuclear Tests:   None    6 Minute Walk:  None    Cardiopulmonary Stress Tests:   None               Other Past Medical History:  See below for the complete EPIC list of past medical and surgical history.      Allergies:  Nitrofurantoin and Lipitor  [atorvastatin ]    Current Medications:  Current Outpatient Medications   Medication Sig Dispense Refill    acetaminophen  (TYLENOL ) 500 MG tablet Take 2 tablets (1,000 mg total) by mouth Three (3) times a day as needed for pain.      albuterol  2.5 mg /3 mL (0.083 %) nebulizer solution Inhale 3 mL by nebulization every six (6) hours as needed for wheezing.      albuterol  HFA 90 mcg/actuation inhaler Inhale 1 puff every six (6) hours as needed for wheezing. 8 g 3    amiodarone  (PACERONE ) 200 MG tablet Take 1 tablet (200 mg total) by mouth daily. 30 tablet 11    blood sugar diagnostic (ACCU-CHEK GUIDE TEST STRIPS) Strp Use to check blood sugar daily 200 strip 6    cranberry 500 mg cap Take 500 mg by mouth daily with evening meal.      cyclobenzaprine  (FLEXERIL ) 10 MG tablet Take 1 tablet (10 mg total) by mouth two (2) times a day as needed for muscle spasms. 60 tablet 11    ergocalciferol -1,250 mcg, 50,000 unit, (DRISDOL ) 1,250 mcg (50,000 unit) capsule Take 1 capsule (1,250 mcg total) by mouth every fourteen (14) days. 6 capsule 1    ferrous sulfate  325 (65 FE) MG tablet Take 1 tablet (325 mg total) by mouth in the morning. 90 tablet 0    fluticasone  propion-salmeterol (ADVAIR  HFA) 115-21 mcg/actuation inhaler INHALE 2 PUFFS TWICE A DAY 12 g 5    fosfomycin (MONUROL ) 3 gram Pack Take 3 g by mouth once a week. 4 packet 5    gabapentin  (NEURONTIN ) 100 MG capsule Take 1 capsule (100 mg total) by mouth two (2) times a day. 180 capsule 3    HYDROcodone -chlorpheniramine  polistirex (TUSSIONEX PENNKINETIC ) 10-8 mg/5 mL ER suspension Take 5 mL by mouth every twelve (12) hours as needed for cough. 115 mL 0    inhalational spacing device (AEROCHAMBER MV) Spcr 1 spacer for inhaler 1 each 0    lancets (ACCU-CHEK SOFTCLIX LANCETS) Misc CHECK BLOOD SUGAR DAILY 200 each 11    lidocaine  (ASPERCREME) 4 % patch Place 1 patch on the skin daily. Remove after 12 hours. 30 patch 2    metOLazone  (ZAROXOLYN ) 5 MG tablet Take 1 tablet (5 mg total) by mouth daily as needed (up to 3x weekly when instructed by cardiology clinic). 12 tablet 0    midodrine  (PROAMATINE ) 5 MG tablet Take 1 tablet (5 mg total) by mouth three (3) times a day. 90 tablet 11    NARCAN 4 mg/actuation nasal spray 1 spray into alternating nostrils once as needed (opioid overdose). PRN - Emergency use.  nystatin  (MYCOSTATIN ) 100,000 unit/gram powder Apply to affected area 3 times daily 30 g 1    ondansetron  (ZOFRAN -ODT) 4 MG disintegrating tablet Dissolve 1 tablet (4 mg total) in the mouth every twelve (12) hours as needed for nausea for up to 14 days. 28 tablet 0    oxyCODONE  (ROXICODONE ) 5 MG immediate release tablet Take 1 tablet (5 mg total) by mouth every eight (8) hours as needed for pain.      OXYGEN -AIR DELIVERY SYSTEMS MISC 4 L by Miscellaneous route continuous. (Patient taking differently: 3 L by Miscellaneous route continuous.)      pantoprazole  (PROTONIX ) 20 MG tablet TAKE 1 TABLET ONCE DAILY 100 tablet 3    rosuvastatin  (CRESTOR ) 5 MG tablet TAKE 1 TABLET EVERY OTHER DAY 45 tablet 3    sodium chloride  3 % NEBULIZER solution Inhale 4 mL by nebulization every six (6) hours as needed. To help raise mucus, use following the albuterol  nebulizer treatment, along with the Aerobika device 120 mL 11    spironolactone  (ALDACTONE ) 25 MG tablet Take 0.5 tablets (12.5 mg total) by mouth daily. 30 tablet 2    tafamidis  (VYNDAMAX ) 61 mg cap Take 1 capsule (61 mg) by mouth daily. 30 capsule 11    tiotropium bromide  (SPIRIVA  RESPIMAT) 2.5 mcg/actuation inhalation mist Inhale 2 puffs daily. 4 g 3    vitamin A -3,000 mcg RAE, 10,000 UNIT, 3,000 mcg RAE (10,000 UNIT) capsule Take 3,000 mcg of RAE by mouth daily.      vutrisiran  (AMVUTTRA ) 25 mg/0.5 mL injection Inject 0.5 mL (25 mg total) under the skin Take as directed. Every 6 Weeks      potassium chloride  20 MEQ ER tablet Take 1 tablet (20 mEq total) by mouth daily. 90 tablet 3    torsemide  (DEMADEX ) 20 MG tablet Take 4 tablets (80 mg total) by mouth daily AND 3 tablets (60 mg total) daily with lunch. 210 tablet 11     No current facility-administered medications for this visit.     Facility-Administered Medications Ordered in Other Visits   Medication Dose Route Frequency Provider Last Rate Last Admin    vutrisiran  (AMVUTTRA ) syringe 25 mg  25 mg Subcutaneous Once Tiphani Mells Kurzon, AGNP           Family History:  The patient's family history includes Cancer in her father; Hypertension in her mother; No Known Problems in her brother, brother, maternal aunt, maternal grandfather, maternal grandmother, maternal uncle, paternal aunt, paternal grandfather, paternal grandmother, paternal uncle, sister, sister, and another family member.    Social history:  She  reports that she quit smoking about 6 years ago. Her smoking use included cigarettes. She smoked an average of 0.3 packs per day. She has never used smokeless tobacco. She reports that she does not drink alcohol and does not use drugs.    Review of Systems:  As per HPI.  Rest of the review of ten systems is negative or unremarkable except as stated above.    Physical Exam:  VITAL SIGNS:   Vitals:    11/03/24 1413   BP: 108/62   Pulse: 65   SpO2: 99%     Wt Readings from Last 3 Encounters:   11/03/24 75.2 kg (165 lb 11.2 oz)   10/18/24 75.8 kg (167 lb)   10/16/24 75.8 kg (167 lb)      Today's Body mass index is 29.35 kg/m??.      CONSTITUTIONAL: well-appearing in no acute distress  EYES: Conjunctivae and sclerae clear  and anicteric.  ENT: Benign.   CARDIOVASCULAR: JVP 1/2 up neck with HOB at 90degrees. + AJR. Rate and rhythm are regular.  There is no lifts or heaves.  Normal S1, S2. There is no murmur, gallops or rubs.  Radial and pedal pulses are 2+, bilaterally.   There is 1+ edema to ankles and feet.  RESPIRATORY: Normal respiratory effort. Clear to auscultation bilaterally..  There are no wheezes.  GASTROINTESTINAL: Soft, non-tender, with audible bowel sounds. Abdomen nondistended.  Liver is nonpalpable.  SKIN: No rashes, ecchymosis or petechiae.  Warm, well perfused.   MUSCULOSKELETAL:  no joint swelling   NEURO/PSYCH: Appropriate mood and affect. Alert and oriented to person, place, and time. No gross motor or sensory deficits evident.    Pertinent Laboratory Studies:   Office Visit on 11/03/2024   Component Date Value Ref Range Status    Sodium 11/03/2024 139  135 - 145 mmol/L Final    Potassium 11/03/2024 4.2  3.4 - 4.8 mmol/L Final    Chloride 11/03/2024 99  98 - 107 mmol/L Final    CO2 11/03/2024 25.3  20.0 - 31.0 mmol/L Final    Anion Gap 11/03/2024 15 (H)  5 - 14 mmol/L Final    BUN 11/03/2024 70 (H)  9 - 23 mg/dL Final    Creatinine 88/92/7974 2.08 (H)  0.55 - 1.02 mg/dL Final    BUN/Creatinine Ratio 11/03/2024 34   Final    eGFR [name]-EPI (2021) Female 11/03/2024 23 (L)  >=60 mL/min/1.68m2 Final    Glucose 11/03/2024 105  70 - 179 mg/dL Final    Calcium  11/03/2024 9.3  8.7 - 10.4 mg/dL Final    TSH 88/92/7974 7.386 (H)  0.550 - 4.780 uIU/mL Final    PRO-BNP 11/03/2024 3,261.0 (H)  <=300.0 pg/mL Final   Admission on 10/18/2024, Discharged on 10/20/2024   Component Date Value Ref Range Status    Sodium 10/18/2024 139  135 - 145 mmol/L Final    Potassium 10/18/2024 5.3 (H)  3.4 - 4.8 mmol/L Final    Chloride 10/18/2024 99  98 - 107 mmol/L Final    CO2 10/18/2024 22.5  20.0 - 31.0 mmol/L Final    Anion Gap 10/18/2024 18 (H)  5 - 14 mmol/L Final    BUN 10/18/2024 45 (H)  9 - 23 mg/dL Final    Creatinine 89/77/7974 1.86 (H)  0.55 - 1.02 mg/dL Final    BUN/Creatinine Ratio 10/18/2024 24   Final    eGFR [name]-EPI (2021) Female 10/18/2024 27 (L)  >=60 mL/min/1.71m2 Final    Glucose 10/18/2024 156  70 - 179 mg/dL Final    Calcium  10/18/2024 9.6  8.7 - 10.4 mg/dL Final    Albumin 89/77/7974 3.5  3.4 - 5.0 g/dL Final    Total Protein 10/18/2024 8.3 (H)  5.7 - 8.2 g/dL Final    Total Bilirubin 10/18/2024 0.5  0.3 - 1.2 mg/dL Final    AST 89/77/7974 30  <=34 U/L Final    ALT 10/18/2024 <7 (L)  10 - 49 U/L Final    Alkaline Phosphatase 10/18/2024 113  46 - 116 U/L Final    PT 10/18/2024 11.9  9.9 - 12.6 sec Final    INR 10/18/2024 1.04   Final    hsTroponin I 10/18/2024 42 (HH)  <=34 ng/L Final    EKG Ventricular Rate 10/18/2024 92  BPM Final    EKG Atrial Rate 10/18/2024 100  BPM Final    EKG QRS Duration 10/18/2024 114  ms  Final    EKG Q-T Interval 10/18/2024 364  ms Final    EKG QTC Calculation 10/18/2024 450  ms Final    EKG Calculated R Axis 10/18/2024 203  degrees Final    EKG Calculated T Axis 10/18/2024 66  degrees Final    QTC Fredericia 10/18/2024 419  ms Final    EKG Ventricular Rate 10/19/2024 83  BPM Final    EKG QRS Duration 10/19/2024 120  ms Final    EKG Q-T Interval 10/19/2024 424  ms Final    EKG QTC Calculation 10/19/2024 498  ms Final    EKG Calculated R Axis 10/19/2024 268  degrees Final    EKG Calculated T Axis 10/19/2024 67  degrees Final    QTC Fredericia 10/19/2024 472  ms Final    WBC 10/18/2024 11.7 (H)  3.6 - 11.2 10*9/L Final    RBC 10/18/2024 4.30  3.95 - 5.13 10*12/L Final    HGB 10/18/2024 13.0  11.3 - 14.9 g/dL Final    HCT 89/77/7974 39.8  34.0 - 44.0 % Final    MCV 10/18/2024 92.5  77.6 - 95.7 fL Final    MCH 10/18/2024 30.2  25.9 - 32.4 pg Final    MCHC 10/18/2024 32.6  32.0 - 36.0 g/dL Final    RDW 89/77/7974 14.9  12.2 - 15.2 % Final    MPV 10/18/2024 8.8  6.8 - 10.7 fL Final    Platelet 10/18/2024 177  150 - 450 10*9/L Final    nRBC 10/18/2024 0  <=4 /100 WBCs Final    Neutrophils % 10/18/2024 80.1  % Final    Lymphocytes % 10/18/2024 8.4  % Final    Monocytes % 10/18/2024 9.9  % Final    Eosinophils % 10/18/2024 1.2  % Final    Basophils % 10/18/2024 0.4  % Final    Absolute Neutrophils 10/18/2024 9.4 (H)  1.8 - 7.8 10*9/L Final    Absolute Lymphocytes 10/18/2024 1.0 (L)  1.1 - 3.6 10*9/L Final    Absolute Monocytes 10/18/2024 1.2 (H)  0.3 - 0.8 10*9/L Final    Absolute Eosinophils 10/18/2024 0.1  0.0 - 0.5 10*9/L Final    Absolute Basophils 10/18/2024 0.0  0.0 - 0.1 10*9/L Final    hsTroponin I 10/18/2024 49 (HH)  <=34 ng/L Final    delta hsTroponin I 10/18/2024 7  <=7 ng/L Final    SARS-CoV-2 PCR 10/18/2024 Negative  Negative Final    Influenza A 10/18/2024 Negative  Negative Final    Influenza B 10/18/2024 Negative  Negative Final    RSV 10/18/2024 Negative  Negative Final    PRO-BNP 10/18/2024 4,681.0 (H)  <=300.0 pg/mL Final    Color, UA 10/19/2024 Colorless   Final    Clarity, UA 10/19/2024 Clear   Final    Specific Gravity, UA 10/19/2024 1.010  1.003 - 1.030 Final    pH, UA 10/19/2024 5.0  5.0 - 9.0 Final    Leukocyte Esterase, UA 10/19/2024 Moderate (A)  Negative Final    Nitrite, UA 10/19/2024 Negative  Negative Final    Protein, UA 10/19/2024 Negative  Negative Final    Glucose, UA 10/19/2024 Negative  Negative Final    Ketones, UA 10/19/2024 Negative  Negative Final    Urobilinogen, UA 10/19/2024 <2.0 mg/dL  <7.9 mg/dL Final    Bilirubin, UA 10/19/2024 Negative  Negative Final    Blood, UA 10/19/2024 Trace (A)  Negative Final    RBC, UA 10/19/2024 7 (H)  <=4 /HPF  Final    WBC, UA 10/19/2024 4  0 - 5 /HPF Final    Squam Epithel, UA 10/19/2024 1  0 - 5 /HPF Final    Bacteria, UA 10/19/2024 Moderate (A)  None Seen /HPF Final    Hyaline Casts, UA 10/19/2024 4 (H)  0 - 1 /LPF Final    Mucus, UA 10/19/2024 Rare (A)  None Seen /HPF Final    Urine Culture, Comprehensive 10/19/2024 Mixed Gram Positive/Gram Negative Organisms Isolated (A)   Final    WBC 10/19/2024 14.7 (H)  3.6 - 11.2 10*9/L Final    RBC 10/19/2024 3.67 (L)  3.95 - 5.13 10*12/L Final    HGB 10/19/2024 11.2 (L)  11.3 - 14.9 g/dL Final    HCT 89/76/7974 33.7 (L)  34.0 - 44.0 % Final    MCV 10/19/2024 91.9  77.6 - 95.7 fL Final    MCH 10/19/2024 30.5  25.9 - 32.4 pg Final    MCHC 10/19/2024 33.2  32.0 - 36.0 g/dL Final    RDW 89/76/7974 14.6  12.2 - 15.2 % Final    MPV 10/19/2024 9.0  6.8 - 10.7 fL Final    Platelet 10/19/2024 151  150 - 450 10*9/L Final    Sodium 10/19/2024 138  135 - 145 mmol/L Final    Potassium 10/19/2024 4.6  3.4 - 4.8 mmol/L Final    Chloride 10/19/2024 100  98 - 107 mmol/L Final    CO2 10/19/2024 23.5  20.0 - 31.0 mmol/L Final    Anion Gap 10/19/2024 15 (H)  5 - 14 mmol/L Final    BUN 10/19/2024 55 (H)  9 - 23 mg/dL Final    Creatinine 89/76/7974 2.09 (H)  0.55 - 1.02 mg/dL Final    BUN/Creatinine Ratio 10/19/2024 26   Final    eGFR [name]-EPI (2021) Female 10/19/2024 23 (L)  >=60 mL/min/1.13m2 Final    Glucose 10/19/2024 180 (H)  70 - 179 mg/dL Final    Calcium  10/19/2024 8.8  8.7 - 10.4 mg/dL Final    Magnesium  10/19/2024 2.4  1.6 - 2.6 mg/dL Final    hsTroponin I 89/76/7974 75 (HH)  <=34 ng/L Final    EKG Ventricular Rate 10/19/2024 68  BPM Final    EKG Atrial Rate 10/19/2024 68  BPM Final    EKG P-R Interval 10/19/2024 216  ms Final    EKG QRS Duration 10/19/2024 118  ms Final    EKG Q-T Interval 10/19/2024 486  ms Final    EKG QTC Calculation 10/19/2024 516  ms Final    EKG Calculated P Axis 10/19/2024 134  degrees Final    EKG Calculated R Axis 10/19/2024 -57  degrees Final    EKG Calculated T Axis 10/19/2024 116  degrees Final    QTC Fredericia 10/19/2024 507  ms Final    Adenovirus 10/19/2024 Not Detected  Not Detected Final    Coronavirus HKU1 10/19/2024 Not Detected  Not Detected Final    Coronavirus NL63 10/19/2024 Not Detected  Not Detected Final    Coronavirus 229E 10/19/2024 Not Detected  Not Detected Final    Coronavirus OC43 PCR 10/19/2024 Not Detected  Not Detected Final    Metapneumovirus 10/19/2024 Not Detected  Not Detected Final    Rhinovirus/Enterovirus 10/19/2024 Not Detected  Not Detected Final    Influenza A 10/19/2024 Not Detected  Not Detected Final    Influenza B 10/19/2024 Not Detected  Not Detected Final    Parainfluenza 1 10/19/2024 Not Detected  Not Detected  Final Parainfluenza 2 10/19/2024 Not Detected  Not Detected Final    Parainfluenza 3 10/19/2024 Not Detected  Not Detected Final    Parainfluenza 4 10/19/2024 Not Detected  Not Detected Final    RSV 10/19/2024 Not Detected  Not Detected Final    Chlamydophila (Chlamydia) pneumoni* 10/19/2024 Not Detected  Not Detected Final    Mycoplasma pneumoniae 10/19/2024 Not Detected  Not Detected Final    SARS-CoV-2 PCR 10/19/2024 Not Detected  Not Detected Final    Sodium 10/20/2024 139  135 - 145 mmol/L Final    Potassium 10/20/2024 4.4  3.4 - 4.8 mmol/L Final    Chloride 10/20/2024 99  98 - 107 mmol/L Final    CO2 10/20/2024 25.8  20.0 - 31.0 mmol/L Final    Anion Gap 10/20/2024 14  5 - 14 mmol/L Final    BUN 10/20/2024 51 (H)  9 - 23 mg/dL Final    Creatinine 89/75/7974 2.08 (H)  0.55 - 1.02 mg/dL Final    BUN/Creatinine Ratio 10/20/2024 25   Final    eGFR [name]-EPI (2021) Female 10/20/2024 23 (L)  >=60 mL/min/1.19m2 Final    Glucose 10/20/2024 115  70 - 179 mg/dL Final    Calcium  10/20/2024 9.4  8.7 - 10.4 mg/dL Final    WBC 89/75/7974 16.7 (H)  3.6 - 11.2 10*9/L Final    RBC 10/20/2024 3.78 (L)  3.95 - 5.13 10*12/L Final    HGB 10/20/2024 11.4  11.3 - 14.9 g/dL Final    HCT 89/75/7974 34.6  34.0 - 44.0 % Final    MCV 10/20/2024 91.6  77.6 - 95.7 fL Final    MCH 10/20/2024 30.3  25.9 - 32.4 pg Final    MCHC 10/20/2024 33.1  32.0 - 36.0 g/dL Final    RDW 89/75/7974 14.5  12.2 - 15.2 % Final    MPV 10/20/2024 9.1  6.8 - 10.7 fL Final    Platelet 10/20/2024 141 (L)  150 - 450 10*9/L Final    Magnesium  10/20/2024 2.4  1.6 - 2.6 mg/dL Final    EKG Ventricular Rate 10/20/2024 71  BPM Final    EKG Atrial Rate 10/20/2024 71  BPM Final    EKG P-R Interval 10/20/2024 202  ms Final    EKG QRS Duration 10/20/2024 118  ms Final    EKG Q-T Interval 10/20/2024 458  ms Final    EKG QTC Calculation 10/20/2024 497  ms Final    EKG Calculated P Axis 10/20/2024 47  degrees Final    EKG Calculated R Axis 10/20/2024 206  degrees Final    EKG Calculated T Axis 10/20/2024 31  degrees Final    QTC Fredericia 10/20/2024 484  ms Final   Appointment on 10/12/2024   Component Date Value Ref Range Status    Sodium 10/12/2024 139  135 - 145 mmol/L Final    Potassium 10/12/2024 4.4  3.4 - 4.8 mmol/L Final    Chloride 10/12/2024 98  98 - 107 mmol/L Final    CO2 10/12/2024 26.5  20.0 - 31.0 mmol/L Final    Anion Gap 10/12/2024 15 (H)  5 - 14 mmol/L Final    BUN 10/12/2024 77 (H)  9 - 23 mg/dL Final    Creatinine 89/83/7974 2.25 (H)  0.55 - 1.02 mg/dL Final    BUN/Creatinine Ratio 10/12/2024 34   Final    eGFR [name]-EPI (2021) Female 10/12/2024 21 (L)  >=60 mL/min/1.24m2 Final    Glucose 10/12/2024 138  70 - 179 mg/dL  Final    Calcium  10/12/2024 9.2  8.7 - 10.4 mg/dL Final    Albumin 89/83/7974 3.1 (L)  3.4 - 5.0 g/dL Final    Total Protein 10/12/2024 7.7  5.7 - 8.2 g/dL Final    Total Bilirubin 10/12/2024 0.4  0.3 - 1.2 mg/dL Final    AST 89/83/7974 23  <=34 U/L Final    ALT 10/12/2024 <7 (L)  10 - 49 U/L Final    Alkaline Phosphatase 10/12/2024 111  46 - 116 U/L Final    PRO-BNP 10/12/2024 3,602.0 (H)  <=300.0 pg/mL Final    Magnesium  10/12/2024 2.8 (H)  1.6 - 2.6 mg/dL Final    WBC 89/83/7974 5.4  3.6 - 11.2 10*9/L Final    RBC 10/12/2024 3.99  3.95 - 5.13 10*12/L Final    HGB 10/12/2024 12.2  11.3 - 14.9 g/dL Final    HCT 89/83/7974 36.6  34.0 - 44.0 % Final    MCV 10/12/2024 91.7  77.6 - 95.7 fL Final    MCH 10/12/2024 30.5  25.9 - 32.4 pg Final    MCHC 10/12/2024 33.3  32.0 - 36.0 g/dL Final    RDW 89/83/7974 14.8  12.2 - 15.2 % Final    MPV 10/12/2024 8.2  6.8 - 10.7 fL Final    Platelet 10/12/2024 172  150 - 450 10*9/L Final    nRBC 10/12/2024 0  <=4 /100 WBCs Final    Neutrophils % 10/12/2024 65.7  % Final    Lymphocytes % 10/12/2024 19.2  % Final    Monocytes % 10/12/2024 11.5  % Final    Eosinophils % 10/12/2024 2.9  % Final    Basophils % 10/12/2024 0.7  % Final    Absolute Neutrophils 10/12/2024 3.6  1.8 - 7.8 10*9/L Final    Absolute Lymphocytes 10/12/2024 1.0 (L)  1.1 - 3.6 10*9/L Final    Absolute Monocytes 10/12/2024 0.6  0.3 - 0.8 10*9/L Final    Absolute Eosinophils 10/12/2024 0.2  0.0 - 0.5 10*9/L Final    Absolute Basophils 10/12/2024 0.0  0.0 - 0.1 10*9/L Final   Hospital Outpatient Visit on 09/14/2024   Component Date Value Ref Range Status    FVC PRE 09/14/2024 1.66 (L)  1.71 - 3.31 L Final    FEV1 PRE 09/14/2024 1.12 (L)  1.27 - 2.46 L Final    FEV1/FVC PRE 09/14/2024 67.68  63.77 - 88.85 % Final    FEF25-75% PRE 09/14/2024 0.41 (L)  0.56 - 2.68 L/s Final    [id number] PRE 09/14/2024 0.41  L/s Final    FIVC PRE 09/14/2024 1.45 (L)  1.71 - 3.31 L Final    FEF50% PRE 09/14/2024 0.49 (L)  1.28 - 4.90 L/s Final    FIF50% PRED 09/14/2024 1.40  L/s Final    PEF PRE 09/14/2024 3.26 (L)  3.88 - 6.84 L/s Final    PIF PRE 09/14/2024 1.87  1.36 - 6.16 L/s Final    FET PRE 09/14/2024 6.03  sec Final    FET100% Change 09/14/2024 5.97  sec Final    QZQ/QPQ49 pre 09/14/2024 26.52  % Final    EOTT PRE 09/14/2024 6.03  sec Final    Vol extrap pre 09/14/2024 0.03  L Final    Grade FVC A19 PRE 09/14/2024 5.00   Final    Grade FEV1 A19 PRE 09/14/2024 4.00   Final    FEV1Q PRE 09/14/2024 2.80    Final    FVC LLN 09/14/2024 1.71   Final  FVC Predicted 09/14/2024 2.49   Final    FVC PreZ-Score 09/14/2024 -1.76   Final    FVC % Predicted PRE 09/14/2024 66 %  % Final    FEV1 LLN 09/14/2024 1.27   Final    FEV1 Predicted 09/14/2024 1.89   Final    FEV1 PreZ-Score 09/14/2024 -2.01   Final    FEV1 % Predicted PRE 09/14/2024 59 %  % Final    FEV1/FVC LLN 09/14/2024 64   Final    FEV1/FVC Predicted 09/14/2024 78   Final    FEV1/FVC PreZ-Score 09/14/2024 -1.22   Final    FEV1/FVC % Predicted PRE 09/14/2024 87 %  % Final    FEF25-75% LLN 09/14/2024 0.56   Final    FEF25-75% Predicted 09/14/2024 1.40   Final    FEF25-75% PreZ-Score 09/14/2024 -2.07   Final    FEF25-75% % Predicted PRE 09/14/2024 30 %  % Final    FVCIN LLN 09/14/2024 1.71   Final    FIVC Predicted 09/14/2024 2.49   Final    FVCIN PreZ-Score 09/14/2024 -2.19   Final    FIVC % Predicted PRE 09/14/2024 58 %  % Final    FEF50% LLN 09/14/2024 1.28   Final    FEF50% Predicted 09/14/2024 3.09   Final    FEF50% PreZ-Score 09/14/2024 -2.36   Final    FEF50% % Predicted PRE 09/14/2024 16 %  % Final    PEF LLN 09/14/2024 3.88   Final    PEF Predicted 09/14/2024 5.36   Final    PEF PreZ-Score 09/14/2024 -2.34   Final    PEF % Predicted PRE 09/14/2024 61 %  % Final    PIF LLN 09/14/2024 1.36   Final    PIF Predicted 09/14/2024 3.76   Final    PIF PreZ-Score 09/14/2024 -0.25   Final    PIF % Predicted PRE 09/14/2024 50 %  % Final    FVC PreZ-Score 09/14/2024 -1.76   Final    FEV1 PreZ-Score 09/14/2024 -2.01   Final    FEV1/FVC PreZ-Score 09/14/2024 -1   Final    FVC %CHANGE 09/14/2024 -13.3  % Final    FEV1 %CHANGE 09/14/2024 0.5  % Final   Office Visit on 09/14/2024   Component Date Value Ref Range Status    TSH 09/14/2024 8.128 (H)  0.550 - 4.780 uIU/mL Final    Sodium 09/14/2024 140  135 - 145 mmol/L Final    Potassium 09/14/2024 4.0  3.4 - 4.8 mmol/L Final    Chloride 09/14/2024 101  98 - 107 mmol/L Final    CO2 09/14/2024 24.5  20.0 - 31.0 mmol/L Final    Anion Gap 09/14/2024 15 (H)  5 - 14 mmol/L Final    BUN 09/14/2024 60 (H)  9 - 23 mg/dL Final    Creatinine 90/81/7974 1.77 (H)  0.55 - 1.02 mg/dL Final    BUN/Creatinine Ratio 09/14/2024 34   Final    eGFR [name]-EPI (2021) Female 09/14/2024 28 (L)  >=60 mL/min/1.67m2 Final    Glucose 09/14/2024 126  70 - 179 mg/dL Final    Calcium  09/14/2024 9.2  8.7 - 10.4 mg/dL Final    PRO-BNP 90/81/7974 3,617.0 (H)  <=300.0 pg/mL Final    Hemoglobin A1C 09/14/2024 4.6 (L)  4.8 - 5.6 % Final    Estimated Average Glucose 09/14/2024 85  mg/dL Final   Appointment on 08/25/2024   Component Date Value Ref Range Status    Sodium 08/25/2024 141  135 - 145 mmol/L Final    Potassium 08/25/2024 3.9  3.4 - 4.8 mmol/L Final    Chloride 08/25/2024 101  98 - 107 mmol/L Final    CO2 08/25/2024 26.8  20.0 - 31.0 mmol/L Final    Anion Gap 08/25/2024 13  5 - 14 mmol/L Final    BUN 08/25/2024 49 (H)  9 - 23 mg/dL Final    Creatinine 91/70/7974 1.54 (H)  0.55 - 1.02 mg/dL Final    BUN/Creatinine Ratio 08/25/2024 32   Final    eGFR [name]-EPI (2021) Female 08/25/2024 34 (L)  >=60 mL/min/1.55m2 Final    Glucose 08/25/2024 115  70 - 179 mg/dL Final    Calcium  08/25/2024 8.9  8.7 - 10.4 mg/dL Final    WBC 91/70/7974 6.5  3.6 - 11.2 10*9/L Final    RBC 08/25/2024 3.88 (L)  3.95 - 5.13 10*12/L Final    HGB 08/25/2024 11.6  11.3 - 14.9 g/dL Final    HCT 91/70/7974 35.6  34.0 - 44.0 % Final    MCV 08/25/2024 91.7  77.6 - 95.7 fL Final    MCH 08/25/2024 30.0  25.9 - 32.4 pg Final    MCHC 08/25/2024 32.7  32.0 - 36.0 g/dL Final    RDW 91/70/7974 15.3 (H)  12.2 - 15.2 % Final    MPV 08/25/2024 8.1  6.8 - 10.7 fL Final    Platelet 08/25/2024 160  150 - 450 10*9/L Final    nRBC 08/25/2024 0  <=4 /100 WBCs Final    Neutrophils % 08/25/2024 65.8  % Final    Lymphocytes % 08/25/2024 15.1  % Final    Monocytes % 08/25/2024 13.0  % Final    Eosinophils % 08/25/2024 5.1  % Final    Basophils % 08/25/2024 1.0  % Final    Absolute Neutrophils 08/25/2024 4.3  1.8 - 7.8 10*9/L Final    Absolute Lymphocytes 08/25/2024 1.0 (L)  1.1 - 3.6 10*9/L Final    Absolute Monocytes 08/25/2024 0.8  0.3 - 0.8 10*9/L Final    Absolute Eosinophils 08/25/2024 0.3  0.0 - 0.5 10*9/L Final    Absolute Basophils 08/25/2024 0.1  0.0 - 0.1 10*9/L Final   Appointment on 08/09/2024   Component Date Value Ref Range Status    Lower Respiratory Culture 08/09/2024 3+ Smooth Pseudomonas aeruginosa (A)   Final    Lower Respiratory Culture 08/09/2024 3+ Mixed Gram Negative Organisms Isolated (A)   Final    Lower Respiratory Culture 08/09/2024 2+ Oropharyngeal Flora Isolated   Final    Lower Respiratory Culture 08/09/2024 1+ Enterobacter cloacae complex (A)   Final    Gram Stain 08/09/2024 <10  Epithelial cells/LPF   Final    Gram Stain 08/09/2024 10-25 PMNS/LPF   Final    Gram Stain 08/09/2024 Smear Results Suggest Mixed oral flora   Final    Gram Stain 08/09/2024 Acceptable for culture   Final    Carba-R Organism 08/09/2024 Enterobacter cloacae complex   Final    IMP PCR 08/09/2024 Not Detected  Not Detected Final    KPC PCR 08/09/2024 Not Detected  Not Detected Final    NDM PCR 08/09/2024 Not Detected  Not Detected Final    OXA48 PCR 08/09/2024 Not Detected  Not Detected Final    VIM PCR 08/09/2024 Not Detected  Not Detected Final   Admission on 07/29/2024, Discharged on 07/31/2024   Component Date Value Ref Range Status    EKG Ventricular Rate 07/29/2024 91  BPM  Final    EKG Atrial Rate 07/29/2024 277  BPM Final    EKG QRS Duration 07/29/2024 110  ms Final    EKG Q-T Interval 07/29/2024 384  ms Final    EKG QTC Calculation 07/29/2024 472  ms Final    EKG Calculated R Axis 07/29/2024 182  degrees Final    EKG Calculated T Axis 07/29/2024 23  degrees Final    QTC Fredericia 07/29/2024 441  ms Final    SARS-CoV-2 PCR 07/29/2024 Negative  Negative Final    Influenza A 07/29/2024 Negative  Negative Final    Influenza B 07/29/2024 Negative  Negative Final    RSV 07/29/2024 Negative  Negative Final    PRO-BNP 07/29/2024 6,303.0 (H)  <=300.0 pg/mL Final    Sodium 07/29/2024 138  135 - 145 mmol/L Final    Potassium 07/29/2024 4.5  3.4 - 4.8 mmol/L Final    Chloride 07/29/2024 96 (L)  98 - 107 mmol/L Final    CO2 07/29/2024 26.7  20.0 - 31.0 mmol/L Final    Anion Gap 07/29/2024 15 (H)  5 - 14 mmol/L Final    BUN 07/29/2024 43 (H)  9 - 23 mg/dL Final    Creatinine 91/97/7974 2.13 (H)  0.55 - 1.02 mg/dL Final    BUN/Creatinine Ratio 07/29/2024 20   Final    eGFR [name]-EPI (2021) Female 07/29/2024 23 (L)  >=60 mL/min/1.49m2 Final    Glucose 07/29/2024 99  70 - 179 mg/dL Final    Calcium  07/29/2024 8.7  8.7 - 10.4 mg/dL Final    hsTroponin I 91/97/7974 58 (HH)  <=34 ng/L Final    EKG Ventricular Rate 07/29/2024 75  BPM Final    EKG QRS Duration 07/29/2024 118  ms Final    EKG Q-T Interval 07/29/2024 436  ms Final    EKG QTC Calculation 07/29/2024 486  ms Final    EKG Calculated R Axis 07/29/2024 177  degrees Final    EKG Calculated T Axis 07/29/2024 27  degrees Final    QTC Fredericia 07/29/2024 469  ms Final    WBC 07/29/2024 11.6 (H)  3.6 - 11.2 10*9/L Final    RBC 07/29/2024 3.60 (L)  3.95 - 5.13 10*12/L Final    HGB 07/29/2024 10.9 (L)  11.3 - 14.9 g/dL Final    HCT 91/97/7974 33.0 (L)  34.0 - 44.0 % Final    MCV 07/29/2024 91.6  77.6 - 95.7 fL Final    MCH 07/29/2024 30.2  25.9 - 32.4 pg Final    MCHC 07/29/2024 33.0  32.0 - 36.0 g/dL Final    RDW 91/97/7974 14.0  12.2 - 15.2 % Final    MPV 07/29/2024 8.9  6.8 - 10.7 fL Final    Platelet 07/29/2024 208  150 - 450 10*9/L Final    nRBC 07/29/2024 0  <=4 /100 WBCs Final    Neutrophils % 07/29/2024 79.1  % Final    Lymphocytes % 07/29/2024 10.2  % Final    Monocytes % 07/29/2024 9.8  % Final    Eosinophils % 07/29/2024 0.1  % Final    Basophils % 07/29/2024 0.8  % Final    Absolute Neutrophils 07/29/2024 9.2 (H)  1.8 - 7.8 10*9/L Final    Absolute Lymphocytes 07/29/2024 1.2  1.1 - 3.6 10*9/L Final    Absolute Monocytes 07/29/2024 1.1 (H)  0.3 - 0.8 10*9/L Final    Absolute Eosinophils 07/29/2024 0.0  0.0 - 0.5 10*9/L Final    Absolute Basophils  07/29/2024 0.1  0.0 - 0.1 10*9/L Final    Ferritin 07/29/2024 79.0  7.3 - 270.7 ng/mL Final    Albumin 07/30/2024 2.5 (L)  3.4 - 5.0 g/dL Final    Total Protein 07/30/2024 6.2  5.7 - 8.2 g/dL Final    Total Bilirubin 07/30/2024 0.4  0.3 - 1.2 mg/dL Final    Bilirubin, Direct 07/30/2024 0.20  0.00 - 0.30 mg/dL Final    AST 91/96/7974 28  <=34 U/L Final    ALT 07/30/2024 10  10 - 49 U/L Final    Alkaline Phosphatase 07/30/2024 121 (H)  46 - 116 U/L Final    Phosphorus 07/30/2024 3.9  2.4 - 5.1 mg/dL Final    Magnesium  07/30/2024 2.3  1.6 - 2.6 mg/dL Final    Sodium 91/96/7974 140  135 - 145 mmol/L Final    Potassium 07/30/2024 4.0  3.4 - 4.8 mmol/L Final    Chloride 07/30/2024 98  98 - 107 mmol/L Final    CO2 07/30/2024 29.0  20.0 - 31.0 mmol/L Final    Anion Gap 07/30/2024 13  5 - 14 mmol/L Final    BUN 07/30/2024 46 (H)  9 - 23 mg/dL Final    Creatinine 91/96/7974 2.18 (H)  0.55 - 1.02 mg/dL Final    BUN/Creatinine Ratio 07/30/2024 21   Final    eGFR [name]-EPI (2021) Female 07/30/2024 22 (L)  >=60 mL/min/1.2m2 Final    Glucose 07/30/2024 76  70 - 179 mg/dL Final    Calcium  07/30/2024 8.8  8.7 - 10.4 mg/dL Final    WBC 91/96/7974 8.9  3.6 - 11.2 10*9/L Final    RBC 07/30/2024 3.40 (L)  3.95 - 5.13 10*12/L Final    HGB 07/30/2024 10.3 (L)  11.3 - 14.9 g/dL Final    HCT 91/96/7974 31.4 (L)  34.0 - 44.0 % Final    MCV 07/30/2024 92.2  77.6 - 95.7 fL Final    MCH 07/30/2024 30.2  25.9 - 32.4 pg Final    MCHC 07/30/2024 32.7  32.0 - 36.0 g/dL Final    RDW 91/96/7974 14.0  12.2 - 15.2 % Final    MPV 07/30/2024 9.0  6.8 - 10.7 fL Final    Platelet 07/30/2024 168  150 - 450 10*9/L Final    nRBC 07/30/2024 0  <=4 /100 WBCs Final    Neutrophils % 07/30/2024 72.7  % Final    Lymphocytes % 07/30/2024 13.3  % Final    Monocytes % 07/30/2024 12.5  % Final    Eosinophils % 07/30/2024 1.0  % Final    Basophils % 07/30/2024 0.5  % Final    Absolute Neutrophils 07/30/2024 6.5  1.8 - 7.8 10*9/L Final    Absolute Lymphocytes 07/30/2024 1.2  1.1 - 3.6 10*9/L Final    Absolute Monocytes 07/30/2024 1.1 (H)  0.3 - 0.8 10*9/L Final    Absolute Eosinophils 07/30/2024 0.1  0.0 - 0.5 10*9/L Final    Absolute Basophils 07/30/2024 0.0  0.0 - 0.1 10*9/L Final    TSH 07/29/2024 4.182  0.550 - 4.780 uIU/mL Final    Cystatin C 07/29/2024 3.75 (H)  0.64 - 1.23 mg/L Final    eGFR [name]-EPI (2012) Cystatin C Fem* 07/29/2024 11 (L)  >=60 mL/min/1.17m2 Final    Vitamin D  Total (25OH) 07/30/2024 58.3  20.0 - 80.0 ng/mL Final    hsTroponin I 07/30/2024 71 (HH)  <=34 ng/L Final    EKG Ventricular Rate 07/30/2024 64  BPM Final  EKG Atrial Rate 07/30/2024 64  BPM Final    EKG P-R Interval 07/30/2024 210  ms Final    EKG QRS Duration 07/30/2024 118  ms Final    EKG Q-T Interval 07/30/2024 440  ms Final    EKG QTC Calculation 07/30/2024 453  ms Final    EKG Calculated P Axis 07/30/2024 59  degrees Final    EKG Calculated R Axis 07/30/2024 266  degrees Final    EKG Calculated T Axis 07/30/2024 64  degrees Final    QTC Fredericia 07/30/2024 449  ms Final    hsTroponin I 07/30/2024 75 (HH)  <=34 ng/L Final    Sodium 07/30/2024 140  135 - 145 mmol/L Final    Potassium 07/30/2024 4.2  3.4 - 4.8 mmol/L Final    Chloride 07/30/2024 97 (L)  98 - 107 mmol/L Final    CO2 07/30/2024 29.5  20.0 - 31.0 mmol/L Final    Anion Gap 07/30/2024 14  5 - 14 mmol/L Final    BUN 07/30/2024 45 (H)  9 - 23 mg/dL Final    Creatinine 91/96/7974 2.16 (H)  0.55 - 1.02 mg/dL Final    BUN/Creatinine Ratio 07/30/2024 21   Final    eGFR [name]-EPI (2021) Female 07/30/2024 22 (L)  >=60 mL/min/1.7m2 Final    Glucose 07/30/2024 95  70 - 179 mg/dL Final    Calcium  07/30/2024 9.1  8.7 - 10.4 mg/dL Final    Magnesium  07/30/2024 2.4  1.6 - 2.6 mg/dL Final    Phosphorus 91/95/7974 3.8  2.4 - 5.1 mg/dL Final    Magnesium  07/31/2024 2.3  1.6 - 2.6 mg/dL Final    Sodium 91/95/7974 142  135 - 145 mmol/L Final    Potassium 07/31/2024 4.0  3.4 - 4.8 mmol/L Final    Chloride 07/31/2024 100  98 - 107 mmol/L Final    CO2 07/31/2024 27.7  20.0 - 31.0 mmol/L Final    Anion Gap 07/31/2024 14  5 - 14 mmol/L Final    BUN 07/31/2024 40 (H)  9 - 23 mg/dL Final    Creatinine 91/95/7974 2.06 (H)  0.55 - 1.02 mg/dL Final    BUN/Creatinine Ratio 07/31/2024 19   Final    eGFR [name]-EPI (2021) Female 07/31/2024 24 (L)  >=60 mL/min/1.17m2 Final    Glucose 07/31/2024 94  70 - 179 mg/dL Final    Calcium  07/31/2024 8.9  8.7 - 10.4 mg/dL Final    WBC 91/95/7974 9.3  3.6 - 11.2 10*9/L Final    RBC 07/31/2024 3.39 (L)  3.95 - 5.13 10*12/L Final    HGB 07/31/2024 10.4 (L)  11.3 - 14.9 g/dL Final    HCT 91/95/7974 31.0 (L)  34.0 - 44.0 % Final    MCV 07/31/2024 91.6  77.6 - 95.7 fL Final    MCH 07/31/2024 30.6  25.9 - 32.4 pg Final    MCHC 07/31/2024 33.4 32.0 - 36.0 g/dL Final    RDW 91/95/7974 13.9  12.2 - 15.2 % Final    MPV 07/31/2024 8.6  6.8 - 10.7 fL Final    Platelet 07/31/2024 181  150 - 450 10*9/L Final   Office Visit on 07/18/2024   Component Date Value Ref Range Status    Sodium 07/18/2024 140  135 - 145 mmol/L Final    Potassium 07/18/2024 4.3  3.4 - 4.8 mmol/L Final    Chloride 07/18/2024 100  98 - 107 mmol/L Final    CO2 07/18/2024 29.9  20.0 - 31.0 mmol/L  Final    Anion Gap 07/18/2024 10  5 - 14 mmol/L Final    BUN 07/18/2024 48 (H)  9 - 23 mg/dL Final    Creatinine 92/77/7974 1.85 (H)  0.55 - 1.02 mg/dL Final    BUN/Creatinine Ratio 07/18/2024 26   Final    eGFR [name]-EPI (2021) Female 07/18/2024 27 (L)  >=60 mL/min/1.60m2 Final    Glucose 07/18/2024 131  70 - 179 mg/dL Final    Calcium  07/18/2024 8.7  8.7 - 10.4 mg/dL Final    PRO-BNP 92/77/7974 4,867.0 (H)  <=300.0 pg/mL Final    Ferritin 07/18/2024 90.3  7.3 - 270.7 ng/mL Final   Appointment on 07/11/2024   Component Date Value Ref Range Status    Iron  07/11/2024 19 (L)  50 - 170 ug/dL Final    TIBC 92/84/7974 263.3  250.0 - 425.0 ug/dL Final    Transferrin 92/84/7974 209.0 (L)  250.0 - 380.0 mg/dL Final    Iron  Saturation (%) 07/11/2024 7  % Final    Sodium 07/11/2024 139  135 - 145 mmol/L Final    Potassium 07/11/2024 4.4  3.4 - 4.8 mmol/L Final    Chloride 07/11/2024 96 (L)  98 - 107 mmol/L Final    CO2 07/11/2024 27.1  20.0 - 31.0 mmol/L Final    Anion Gap 07/11/2024 16 (H)  5 - 14 mmol/L Final    BUN 07/11/2024 69 (H)  9 - 23 mg/dL Final    Creatinine 92/84/7974 2.43 (H)  0.55 - 1.02 mg/dL Final    BUN/Creatinine Ratio 07/11/2024 28   Final    eGFR [name]-EPI (2021) Female 07/11/2024 19 (L)  >=60 mL/min/1.68m2 Final    Glucose 07/11/2024 156  70 - 179 mg/dL Final    Calcium  07/11/2024 8.8  8.7 - 10.4 mg/dL Final    WBC 92/84/7974 7.6  3.6 - 11.2 10*9/L Final    RBC 07/11/2024 3.07 (L)  3.95 - 5.13 10*12/L Final    HGB 07/11/2024 9.5 (L)  11.3 - 14.9 g/dL Final    HCT 92/84/7974 28.9 (L)  34.0 - 44.0 % Final    MCV 07/11/2024 94.4  77.6 - 95.7 fL Final    MCH 07/11/2024 31.1  25.9 - 32.4 pg Final    MCHC 07/11/2024 32.9  32.0 - 36.0 g/dL Final    RDW 92/84/7974 16.5 (H)  12.2 - 15.2 % Final    MPV 07/11/2024 8.1  6.8 - 10.7 fL Final    Platelet 07/11/2024 192  150 - 450 10*9/L Final    nRBC 07/11/2024 0  <=4 /100 WBCs Final    Neutrophils % 07/11/2024 71.7  % Final    Lymphocytes % 07/11/2024 13.8  % Final    Monocytes % 07/11/2024 11.8  % Final    Eosinophils % 07/11/2024 1.4  % Final    Basophils % 07/11/2024 1.3  % Final    Absolute Neutrophils 07/11/2024 5.5  1.8 - 7.8 10*9/L Final    Absolute Lymphocytes 07/11/2024 1.0 (L)  1.1 - 3.6 10*9/L Final    Absolute Monocytes 07/11/2024 0.9 (H)  0.3 - 0.8 10*9/L Final    Absolute Eosinophils 07/11/2024 0.1  0.0 - 0.5 10*9/L Final    Absolute Basophils 07/11/2024 0.1  0.0 - 0.1 10*9/L Final   There may be more visits with results that are not included.       Lab Results   Component Value Date    PRO-BNP 3,261.0 (H) 11/03/2024    PRO-BNP 4,681.0 (  H) 10/18/2024    PRO-BNP 3,602.0 (H) 10/12/2024    Creatinine 2.08 (H) 11/03/2024    Creatinine 2.08 (H) 10/20/2024    Creatinine 0.79 01/06/2011    BUN 70 (H) 11/03/2024    BUN 51 (H) 10/20/2024    BUN 20 01/06/2011    Sodium 139 11/03/2024    Sodium 140 01/06/2011    Potassium 4.2 11/03/2024    Potassium 4.1 01/06/2011    CO2 25.3 11/03/2024    CO2 30 01/06/2011    Magnesium  2.4 10/20/2024    Magnesium  2.0 01/06/2011    Total Bilirubin 0.5 10/18/2024    INR 1.04 10/18/2024    INR 1.0 01/06/2011       Lab Results   Component Value Date    Digoxin Level 0.9 10/01/2020       Lab Results   Component Value Date    TSH 7.386 (H) 11/03/2024    Cholesterol, Total 95 12/06/2021    Triglycerides 66 12/06/2021    Cholesterol, HDL 42 12/06/2021    Cholesterol, Non-HDL, Calculated 53 (L) 12/06/2021    Cholesterol, LDL, Calculated 40 12/06/2021       Lab Results   Component Value Date    WBC 16.7 (H) 10/20/2024    WBC 12.5 (H) 01/06/2011    HGB 11.4 10/20/2024    HGB 12.9 01/06/2011    HCT 34.6 10/20/2024    HCT 40.8 01/06/2011    Platelet 141 (L) 10/20/2024    Platelet 260 01/06/2011       Pertinent Test Results from Today:  None    Other pertinent records were reviewed.    The following are further history from the patient's EPIC record for reference:     Past Medical History:   Diagnosis Date    Acute exacerbation of CHF (congestive heart failure) (CMS-HCC) 08/19/2023    Acute kidney injury superimposed on chronic kidney disease 10/11/2020    Acute on chronic diastolic (congestive) heart failure (CMS-HCC) 08/23/2020    Acute on chronic diastolic congestive heart failure (CMS-HCC) 08/23/2020    AKI (acute kidney injury) 04/14/2015    Lab Results  Component  Value  Date     CREATININE  1.90 (H)  06/12/2021     Had a bump in her creatinine when she was taking her diuretics every day.  She is currently taking 40 mg daily of torsemide  and 50 mg of spironolactone .  Her volume status is fragile.  Previously when she stopped her diuretic she becomes short of breath.  Plan: We will check her BMP today.  We will likely have to go to 40    Anemia 08/22/2019    Iron  deficiency anemia          Lab Results      Component    Value    Date           WBC    9.2    03/21/2021           RBC    3.67 (L)    03/21/2021           HGB    9.9 (L)    03/21/2021           HCT    30.7 (L)    03/21/2021           MCV    83.7    03/21/2021           MCH    26.9  03/21/2021           MCHC    32.1    03/21/2021           RDW    21.9 (H)    03/21/2021           PLT    318     Arthritis     Calculus of kidney     Calculus of ureter     CHF (congestive heart failure) (CMS-HCC)     Chronic atrial fibrillation    (CMS-HCC) 07/20/2019    COPD (chronic obstructive pulmonary disease) (CMS-HCC)     Diabetes (CMS-HCC)     Gangrenous cholecystitis 10/11/2020    Generalized edema  06/17/2021    GERD (gastroesophageal reflux disease)     Hydronephrosis     Hypertension Hyponatremia 10/11/2020    Intermediate coronary syndrome (CMS-HCC) 03/13/2014    Lower extremity edema 09/28/2020    Lumbar stenosis     Microscopic hematuria     Nausea alone     Nephrolithiasis 04/17/2016    Neuropathy     Nocturia     Nocturia 07/01/2017    Other chronic cystitis     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  See plans for anemia, AKI  We are also tapering her gabapentin .  Currently on 300 mg 3 times daily.  We will decrease it to twice daily, and then nightly.  She states she is not having any recurrence of her pain.    Pulmonary hypertension    (CMS-HCC)     Renal colic     Shortness of breath 04/28/2020    Sleep apnea     Unstable angina pectoris    (CMS-HCC) 03/13/2014       Past Surgical History:   Procedure Laterality Date    BACK SURGERY  1995    CARPAL TUNNEL RELEASE Left 2014    HYSTERECTOMY  1971    IR INSERT CHOLECYSTOSTOMY TUBE PERCUTANEOUS  10/02/2020    IR INSERT CHOLECYSTOSMY TUBE PERCUTANEOUS 10/02/2020 Ivonne Butler Seed, MD IMG [facility]IR [facility]    LUMBAR DISC SURGERY      PR REMOVAL GALLBLADDER N/A 10/06/2020    Procedure: CHOLECYSTECTOMY;  Surgeon: Curtistine Mira Dunnings, MD;  Location: MAIN OR [facility];  Service: Trauma    PR RIGHT HEART CATH O2 SATURATION & CARDIAC OUTPUT N/A 09/30/2020    Procedure: Right Heart Catheterization;  Surgeon: Reyes Jerilynn Sage, MD;  Location: [facility] CATH;  Service: Cardiology    PR RIGHT HEART CATH O2 SATURATION & CARDIAC OUTPUT N/A 01/09/2021    Procedure: Right Heart Catheterization;  Surgeon: Norleen Deward Grumbling, MD;  Location: [facility] CATH;  Service: Cardiology

## 2024-11-06 MED FILL — VYNDAMAX 61 MG CAPSULE: ORAL | 30 days supply | Qty: 30 | Fill #11

## 2024-11-08 DIAGNOSIS — G4733 Obstructive sleep apnea (adult) (pediatric): Principal | ICD-10-CM

## 2024-11-08 DIAGNOSIS — J9611 Chronic respiratory failure with hypoxia: Principal | ICD-10-CM

## 2024-11-08 DIAGNOSIS — E1142 Type 2 diabetes mellitus with diabetic polyneuropathy: Principal | ICD-10-CM

## 2024-11-08 DIAGNOSIS — D500 Iron deficiency anemia secondary to blood loss (chronic): Principal | ICD-10-CM

## 2024-11-08 DIAGNOSIS — Z9981 Dependence on supplemental oxygen: Principal | ICD-10-CM

## 2024-11-08 DIAGNOSIS — I5033 Acute on chronic diastolic (congestive) heart failure: Principal | ICD-10-CM

## 2024-11-08 DIAGNOSIS — K219 Gastro-esophageal reflux disease without esophagitis: Principal | ICD-10-CM

## 2024-11-08 DIAGNOSIS — E669 Obesity, unspecified: Principal | ICD-10-CM

## 2024-11-08 DIAGNOSIS — N184 Chronic kidney disease, stage 4 (severe): Principal | ICD-10-CM

## 2024-11-08 DIAGNOSIS — D696 Thrombocytopenia, unspecified: Principal | ICD-10-CM

## 2024-11-08 DIAGNOSIS — G8929 Other chronic pain: Principal | ICD-10-CM

## 2024-11-08 DIAGNOSIS — I480 Paroxysmal atrial fibrillation: Principal | ICD-10-CM

## 2024-11-08 DIAGNOSIS — M810 Age-related osteoporosis without current pathological fracture: Principal | ICD-10-CM

## 2024-11-08 DIAGNOSIS — Z7951 Long term (current) use of inhaled steroids: Principal | ICD-10-CM

## 2024-11-08 DIAGNOSIS — E854 Organ-limited amyloidosis: Principal | ICD-10-CM

## 2024-11-08 DIAGNOSIS — M48061 Spinal stenosis, lumbar region without neurogenic claudication: Principal | ICD-10-CM

## 2024-11-08 DIAGNOSIS — J189 Pneumonia, unspecified organism: Principal | ICD-10-CM

## 2024-11-08 DIAGNOSIS — I43 Cardiomyopathy in diseases classified elsewhere: Principal | ICD-10-CM

## 2024-11-08 DIAGNOSIS — I129 Hypertensive chronic kidney disease with stage 1 through stage 4 chronic kidney disease, or unspecified chronic kidney disease: Principal | ICD-10-CM

## 2024-11-08 DIAGNOSIS — D631 Anemia in chronic kidney disease: Principal | ICD-10-CM

## 2024-11-08 DIAGNOSIS — I272 Pulmonary hypertension, unspecified: Principal | ICD-10-CM

## 2024-11-08 DIAGNOSIS — N3020 Other chronic cystitis without hematuria: Principal | ICD-10-CM

## 2024-11-08 DIAGNOSIS — E1122 Type 2 diabetes mellitus with diabetic chronic kidney disease: Principal | ICD-10-CM

## 2024-11-08 DIAGNOSIS — J440 Chronic obstructive pulmonary disease with acute lower respiratory infection: Principal | ICD-10-CM

## 2024-11-08 DIAGNOSIS — M47897 Other spondylosis, lumbosacral region: Principal | ICD-10-CM

## 2024-11-08 MED ORDER — SPIRIVA RESPIMAT 2.5 MCG/ACTUATION SOLUTION FOR INHALATION
Freq: Every day | RESPIRATORY_TRACT | 11 refills | 0.00000 days | Status: CP
Start: 2024-11-08 — End: 2025-11-08

## 2024-11-11 DIAGNOSIS — I5032 Chronic diastolic (congestive) heart failure: Principal | ICD-10-CM

## 2024-11-11 MED ORDER — TORSEMIDE 20 MG TABLET
ORAL_TABLET | ORAL | 11 refills | 30.00000 days
Start: 2024-11-11 — End: 2025-11-11

## 2024-11-12 MED ORDER — METOLAZONE 5 MG TABLET
ORAL_TABLET | Freq: Every day | ORAL | 0 refills | 0.00000 days | PRN
Start: 2024-11-12 — End: ?

## 2024-11-13 DIAGNOSIS — I5032 Chronic diastolic (congestive) heart failure: Principal | ICD-10-CM

## 2024-11-13 MED ORDER — TORSEMIDE 20 MG TABLET
ORAL_TABLET | ORAL | 11 refills | 30.00000 days | Status: CP
Start: 2024-11-13 — End: 2025-11-13

## 2024-11-13 NOTE — Telephone Encounter (Signed)
 Refill request received for patient.      Medication Requested: torsemide   Last Office Visit: 11/03/2024   Next Office Visit: 11/12/2024  Last Prescriber: forbes baker    Nurse refill requirements met? No  If not met, why: Possible duplicate refill request. Please address.rfilled 11/13/24    The original prescription was reordered on 11/13/2024 by [name], Emily Kurzon, AGNP. Renewing this prescription may not be appropriate.       Sent to: Provider for signing  If sent to provider, which provider?: e baker

## 2024-11-14 MED ORDER — METOLAZONE 5 MG TABLET
ORAL_TABLET | Freq: Every day | ORAL | 0 refills | 12.00000 days | Status: CP | PRN
Start: 2024-11-14 — End: 2024-12-14

## 2024-11-14 MED ORDER — TORSEMIDE 20 MG TABLET
ORAL_TABLET | ORAL | 11 refills | 30.00000 days
Start: 2024-11-14 — End: 2025-11-14

## 2024-11-14 NOTE — Telephone Encounter (Signed)
 card

## 2024-11-17 NOTE — Telephone Encounter (Signed)
 Monica with [facility] calling to follow up re: fax sent about home infusion request form. Have you received this Damien? I don't see it scanned into chart. She is asking for call back [phone]

## 2024-11-26 MED ORDER — AMIODARONE 200 MG TABLET
ORAL_TABLET | Freq: Every day | ORAL | 0 refills | 0.00000 days
Start: 2024-11-26 — End: ?

## 2024-11-27 DIAGNOSIS — E854 Organ-limited amyloidosis: Principal | ICD-10-CM

## 2024-11-27 DIAGNOSIS — I43 Cardiomyopathy in diseases classified elsewhere: Principal | ICD-10-CM

## 2024-11-27 MED ORDER — AMIODARONE 200 MG TABLET
ORAL_TABLET | Freq: Every day | ORAL | 1 refills | 30.00000 days | Status: CP
Start: 2024-11-27 — End: ?

## 2024-11-27 MED ORDER — VYNDAMAX 61 MG CAPSULE
ORAL_CAPSULE | Freq: Every day | ORAL | 11 refills | 30.00000 days | Status: CP
Start: 2024-11-27 — End: ?
  Filled 2024-12-04: qty 30, 30d supply, fill #0

## 2024-11-27 NOTE — Telephone Encounter (Signed)
 Refill request received for patient.      Medication Requested: Amiodarone   Last Office Visit: 11/17/2024   Next Office Visit: Visit date not found  Last Prescriber: Damien Lee NP    Nurse refill requirements met? Yes  If not met, why: n/a    Sent to: Pharmacy per protocol  If sent to provider, which provider?: n/a

## 2024-11-27 NOTE — Progress Notes (Signed)
 [facility] Refill Coordination Note    Specialty Medication(s) to be Shipped:   Cardiology: Vyndamax     Other medication(s) to be shipped: No additional medications requested for fill at this time    Specialty Medications not needed at this time: N/A     Barbara Huber, DOB: 16-Feb-1941  Phone: [phone] (home)       All above HIPAA information was verified with patient.     Was a nurse, learning disability used for this call? No    Completed refill call assessment today to schedule patient's medication shipment from the [facility]  [phone]).  All relevant notes have been reviewed.     Specialty medication(s) and dose(s) confirmed: Regimen is correct and unchanged.   Changes to medications: Barbara Huber reports no changes at this time.  Changes to insurance: No  New side effects reported not previously addressed with a pharmacist or physician: None reported  Questions for the pharmacist: No    Confirmed patient received a Conservation Officer, Historic Buildings and a Surveyor, Mining with first shipment. The patient will receive a drug information handout for each medication shipped and additional FDA Medication Guides as required.       DISEASE/MEDICATION-SPECIFIC INFORMATION        N/A    SPECIALTY MEDICATION ADHERENCE     Medication Adherence    Patient reported X missed doses in the last month: 0  Specialty Medication: Vyndamax  61mg   Patient is on additional specialty medications: No              Were doses missed due to medication being on hold? No    Vyndamax  61 mg: 10 days of medicine on hand     REFERRAL TO PHARMACIST     Referral to the pharmacist: Not needed      [facility]     Shipping address confirmed in Epic.     Cost and Payment: Patient has a $0 copay, payment information is not required.    Delivery Scheduled: Yes, Expected medication delivery date: 12/05/24.     Medication will be delivered via Next Day Courier to the prescription address in Epic WAM.    [name], [facility]   [facility]  Specialty Pharmacist

## 2024-11-27 NOTE — Telephone Encounter (Signed)
 Refill request received for patient.      Medication Requested: tafamidis  61 mg cap  Last Office Visit: 11/26/2024   Next Office Visit: Visit date not found  Last Prescriber: Jock Queen MD    Nurse refill requirements met? Yes  If not met, why:     Sent to: Pharmacy per protocol  If sent to provider, which provider?:

## 2024-12-06 DIAGNOSIS — I5032 Chronic diastolic (congestive) heart failure: Principal | ICD-10-CM

## 2024-12-06 DIAGNOSIS — E854 Organ-limited amyloidosis: Principal | ICD-10-CM

## 2024-12-06 DIAGNOSIS — I43 Cardiomyopathy in diseases classified elsewhere: Principal | ICD-10-CM

## 2024-12-06 MED ORDER — MIDODRINE 5 MG TABLET
ORAL_TABLET | Freq: Three times a day (TID) | ORAL | 3 refills | 30.00000 days | Status: CP
Start: 2024-12-06 — End: 2025-12-06

## 2024-12-06 NOTE — Telephone Encounter (Signed)
 Refill request received for patient.      Medication Requested: midodrine  5 mg tab  Last Office Visit: 11/03/2024  Next Office Visit: 01/26/2025  Last Prescriber: [name] Damien BODILY    Nurse refill requirements met? Yes  If not met, why:     Sent to: Pharmacy per protocol  If sent to provider, which provider?:

## 2024-12-07 ENCOUNTER — Encounter
Admit: 2024-12-07 | Discharge: 2024-12-08 | Payer: Medicare (Managed Care) | Attending: Pulmonary Disease | Primary: Pulmonary Disease

## 2024-12-07 DIAGNOSIS — I50812 Chronic right heart failure: Principal | ICD-10-CM

## 2024-12-07 DIAGNOSIS — J449 Chronic obstructive pulmonary disease, unspecified: Principal | ICD-10-CM

## 2024-12-07 DIAGNOSIS — J9611 Chronic respiratory failure with hypoxia: Principal | ICD-10-CM

## 2024-12-07 DIAGNOSIS — I272 Pulmonary hypertension, unspecified: Principal | ICD-10-CM

## 2024-12-07 NOTE — Progress Notes (Signed)
 [facility]  at [facility]  Pulmonary Hypertension Program                        HILARIO Lynwood Abu, MD--Director (Pulmonary)  Heron CROME. Janie, MD--Associate Director (Pulmonary)  Alm Chatters, MD (Cardiology)    Leita Eans, RN Nurse Coordinator  Hardin Kubas, RN Nurse Coordinator    [phone] office  [phone] fax         VIDEO VISIT    [name]  is a 83 y.o. female patient referred for work up and evaluation for pulmonary hypertension, COPD, and chronic hypoxemic respiratory failure.    Assessment:      Barbara Huber is a 83 y.o.female with COPD and chronic hypoxemic respiratory failure. She was previously referred to me for pulmonary hypertension, though this is now clearly dominant left heart/group 2 pathology (with small precapillary component, either secondary to [name] versus COPD).     Overall had been doing well from a COPD standpoint, though last summer/fall with persistent cough and sputum, newly found to have Pseudomonas (and resistant Enterobacter) on sputum culture. With last visit we had prolonged discussion re: airway clearance as well as obtaining CT chest due to ongoing symptoms; she ended up hospitalized a month later, given a course of doxy/Augmentin  and she feels this really resolved her bronchitis symptoms, which is excellent. I had previously ordered meds/supplies to promote airway clearance as well as CT chest and repeat sputums culture, though I am fine holding on all of these as long as she is feeling better. She remains on non-DPI triple inhaler therapy plan.      Plan:       - As above, will hold on CT chest, sputum culture, airway clearance for now given clinical improvements, other medical complexity, though can revisit at any time in the future.  - Continue Advair  HFA and Spiriva  Respimat. Did not do well with previous trial of LABA/LAMA only (side effects with Stiolto), so will leave unchanged for now.   - Continue oxygen  at 3LPM including at night. Continue Trilogy/O2 at night. She is benefiting from AVAPS settings.   - Previous discussion re: formal PR, can continue to discuss in future if patient interested.  - Close followup with heart failure team.     Followup in 6 months.     The patient reports they are physically located in [place]  and is currently: at home. I conducted a audio/video visit. I spent  79m 15s on the video call with the patient. I spent an additional 17 minutes on pre- and post-visit activities on the date of service .       Heron CROME Janie, MD  12/07/24             Subjective:        Patient's primary [place] pulmonologist: Heron CROME Janie, MD   Patient's physical location during visit [place], [place]): [place]  Patient's call back number: [phone]       Name/Relationship of other individuals with the patient in the room during video telehealth visit: daughter Dennison  I have confirmed that the patient requests and consents to having the above listed persons stay in the patient's room during the video telehealth visit.    The patient was advised that the provider must close the video visit first when visit is completed.       83 y.o. 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 Theotis. Recalls COPD diagnosis dating back more than 10 years ago, no history of asthma or other lung conditions. Overall it sounds like she has been maintained on triple inhaler therapy (Advair  diskus, Spiriva  handihaler), with exacerbations averaging maybe once/year, usually not requiring hospitalization. Albuterol  PRN via MDI (tries to use BID by routine) or neb (rare use). In 06/2019 she was admitted to [facility] with left sided pneumonia, also treated for COPD exacerbation with steroids during this, course also complicated by AKI and Afib/RVR. Hypercapnic during admission and started on Trilogy for discharge. She also carries diagnosis of CHF, made a few years ago, at least 2 admissions (usually [place]) for diuresis. With recent admission did have very elevated proBNPs and discharged on higher dose of Lasix  (40 mg daily). Denies recent edema on this higher dose of diuretic. No orthopnea. She has not been doing a lot of physical activity during COVID, will notice very mild dyspnea with self care, no exertional CP or LH. Does find neuropathy can hinder her activity as well. Occasional cough when not having exacerbation, clear sputum. Did have hemoptysis with pneumonia, none outside of this. With first visit we discussed recommendation to repeat CXR, and also repeat TTE in stable outpatient state.    Subsequent history: At visit 10/2019 doing okay, occasional episodes of DOE but overall happy with level of function. Discussed potential for clinical trial enrollment, but she was feeling very well and did not want to do invasive testing unless changing. Admitted to [facility] 04/2020 for dyspnea and decompensated heart failure, diuresed and also given IV iron . At followup 06/2020 not quite back at baseline, but improving steadily. She was then admitted 07/2020 again for heart failure, and then again in 09/2020 for two weeks with decompensated heart failure, Afib/RVR, septic shock, and gangrenous cholecystitis. Shortly thereafter she was diagnosed with ATTR cardiac amyloid. Admitted 12/2020 with dyspnea and decompensated heart failure, diuresed and underwent DCCV. At followup that month slowing improving since discharge, though more hypoxemic than baseline, discussed anemia/iron  and changed inhalers to non-DPI formulations. At followup 01/2021 feeling much better, more active, started tafamidis , no additional changes made. At followup 04/2021 doing well, spirometry improved, no changes made. At followup 10/2021 doing okay, no changes. At followup 05/2022 doing well, brief recent ED stay for low sats which was due to Sorrel malfunction, changed Advair /Spiriva  to Stiolto to get rid of ICS, though this was ultimately not tolerated and went back to Advair /Spiriva . At followup 09/2022 visit early mainly for oxygen  need documentation, doing very well, no other changes. At followup 03/2023 doing well aside from fall with pelvic fracture in interim, creatinine continuing to increase, no changes from COPD standpoint. At followup 05/2024 noting more DOE over the last month, unclear etiology though volume not markedly increased, checking labs. At followup  08/2024 discussed recent bothersome cough, possible pneumonia over the summer, Pseudomonas newly present on culture, previously treated with levofloxacin . Discussed airway clearance introduction with Aerobika, 3% HTS and albuterol , and repeating CT chest. At followup 11/2024 doing better with bronchitis symptoms, holding off on additional testing for now.    Interval history 11/2024 (video): Since last visit  Feeling pretty good lately, no complaints  Mucus seems to have completely cleared now  Not doing breathing treatments, just regular inhalers  Was hospitalized the month after I saw her, another abx course  Feels this last one cleared it  Fluid lately has been doing well  Torsemide  140 mg daily, metolazone  weekly  Sleeping well with Trilogy, no changes.       Past medical, past surgical, family, and social histories reviewed and updated in Epic.    Review of systems is significant for:   A 12 point review of systems was negative except for pertinent items noted in the HPI.    Allergies   Allergen Reactions    Nitrofurantoin Anaphylaxis and Other (See Comments)    Lipitor  [Atorvastatin ] Muscle Pain     SOB, Headache, fatigue, sick on my stomach      Current Outpatient Medications   Medication Sig Dispense Refill    acetaminophen  (TYLENOL ) 500 MG tablet Take 2 tablets (1,000 mg total) by mouth Three (3) times a day as needed for pain.      albuterol  2.5 mg /3 mL (0.083 %) nebulizer solution Inhale 3 mL by nebulization every six (6) hours as needed for wheezing.      albuterol  HFA 90 mcg/actuation inhaler Inhale 1 puff every six (6) hours as needed for wheezing. 8 g 3    amiodarone  (PACERONE ) 200 MG tablet Take 1 tablet (200 mg total) by mouth daily. 30 tablet 1    blood sugar diagnostic (ACCU-CHEK GUIDE TEST STRIPS) Strp Use to check blood sugar daily 200 strip 6    cranberry 500 mg cap Take 500 mg by mouth daily with evening meal.      cyclobenzaprine  (FLEXERIL ) 10 MG tablet Take 1 tablet (10 mg total) by mouth two (2) times a day as needed for muscle spasms. 60 tablet 11    ergocalciferol -1,250 mcg, 50,000 unit, (DRISDOL ) 1,250 mcg (50,000 unit) capsule Take 1 capsule (1,250 mcg total) by mouth every fourteen (14) days. 6 capsule 1    ferrous sulfate  325 (65 FE) MG tablet Take 1 tablet (325 mg total) by mouth in the morning. 90 tablet 0    fluticasone  propion-salmeterol (ADVAIR  HFA) 115-21 mcg/actuation inhaler INHALE 2 PUFFS TWICE A DAY 12 g 5    fosfomycin (MONUROL ) 3 gram Pack Take 3 g by mouth once a week. 4 packet 5    gabapentin  (NEURONTIN ) 100 MG capsule Take 1 capsule (100 mg total) by mouth two (2) times a day. 180 capsule 3    HYDROcodone -chlorpheniramine  polistirex (TUSSIONEX PENNKINETIC ) 10-8 mg/5 mL ER suspension Take 5 mL by mouth every twelve (12) hours as needed for cough. 115 mL 0    inhalational spacing device (AEROCHAMBER MV) Spcr 1 spacer for inhaler 1 each 0    lancets (ACCU-CHEK SOFTCLIX LANCETS) Misc CHECK BLOOD SUGAR DAILY 200 each 11    lidocaine  (ASPERCREME) 4 % patch Place 1 patch on the skin daily. Remove after 12 hours. 30 patch 2    metOLazone  (ZAROXOLYN ) 5 MG tablet Take 1 tablet (5 mg total) by mouth daily as needed (up to 3x weekly when instructed by  cardiology clinic). 12 tablet 0    midodrine  (PROAMATINE ) 5 MG tablet Take 1 tablet (5 mg total) by mouth three (3) times a day. 90 tablet 3    NARCAN 4 mg/actuation nasal spray 1 spray into alternating nostrils once as needed (opioid overdose). PRN - Emergency use.      nystatin  (MYCOSTATIN ) 100,000 unit/gram powder Apply to affected area 3 times daily 30 g 1    oxyCODONE  (ROXICODONE ) 5 MG immediate release tablet Take 1 tablet (5 mg total) by mouth every eight (8) hours as needed for pain.      OXYGEN -AIR DELIVERY SYSTEMS MISC 4 L by Miscellaneous route continuous. (Patient taking differently: 3 L by Miscellaneous route continuous.)      pantoprazole  (PROTONIX ) 20 MG tablet TAKE 1 TABLET ONCE DAILY 100 tablet 3    potassium chloride  20 MEQ ER tablet Take 1 tablet (20 mEq total) by mouth daily. 90 tablet 3    rosuvastatin  (CRESTOR ) 5 MG tablet TAKE 1 TABLET EVERY OTHER DAY 45 tablet 3    sodium chloride  3 % NEBULIZER solution Inhale 4 mL by nebulization every six (6) hours as needed. To help raise mucus, use following the albuterol  nebulizer treatment, along with the Aerobika device 120 mL 11    spironolactone  (ALDACTONE ) 25 MG tablet Take 0.5 tablets (12.5 mg total) by mouth daily. 30 tablet 2    tafamidis  (VYNDAMAX ) 61 mg cap Take 1 capsule (61 mg) by mouth daily. 30 capsule 11    tiotropium bromide  (SPIRIVA  RESPIMAT) 2.5 mcg/actuation inhalation mist Inhale 2 puffs daily. 4 g 11    torsemide  (DEMADEX ) 20 MG tablet Take 4 tablets (80 mg total) by mouth daily AND 3 tablets (60 mg total) daily with lunch. 210 tablet 11    vitamin A -3,000 mcg RAE, 10,000 UNIT, 3,000 mcg RAE (10,000 UNIT) capsule Take 3,000 mcg of RAE by mouth daily.      vutrisiran  (AMVUTTRA ) 25 mg/0.5 mL injection Inject 0.5 mL (25 mg total) under the skin Take as directed. Every 6 Weeks       No current facility-administered medications for this visit.          Objective:   Objective     Physical Exam:   LMP  (LMP Unknown)   There is no height or weight on file to calculate BMI.      Wt Readings from Last 6 Encounters:   11/03/24 75.2 kg (165 lb 11.2 oz)   10/18/24 75.8 kg (167 lb)   10/16/24 75.8 kg (167 lb)   09/14/24 75.3 kg (166 lb)   09/04/24 74.3 kg (163 lb 12.8 oz)   08/02/24 76.7 kg (169 lb)     General Appearance:    Alert, cooperative, no distress, appears stated age.     Diagnostic Review:    Cardiac testing summary:  TTE 06/14/24: LA normal, EF 60-65%, mod LVH       RV normal, mild TR, est PASP 57       No effusion. Est RAP by IVC 5-10  RHC/ICU PAC 08/2023: RA 18, PA 70/40, PCW 28  TTE 07/2023: LA mod dil, EF >70%       RV normal, tr TR, can't est PASP       No effusion  TTE 01/2023 : LA mod dil, EF >70%       RV normal, est PASP 46       No effusion. IVC normal  TTE 11/2021: EF >70%, gr 3DD, sev LVH  RV mild dil, nl fxn       No effusion. IVC dil/poor insp collapse  TTE 03/2021:   RHC 01/09/21: RA 23, PA 85/40 (55), PCW 32       CO/CI 5.3/2.5 (f,td), PA sat 57%, PVR 4.2 WU  TTE 09/2020: LA mild dil, EF >55%       RV ULN size, HK, tr TR, can't est PASP       No effusion. IVC dil, poor insp collapse  TTE 04/2020: LA 41, EF >55%, LVH (16)       RV normal, mild HK, TAPSE 25, can't est PASP       No effusion. IVC normal.  TTE 10/2019: LA 42, EF >55%       RV mild dil/HK, can't est PASP       No effusion. IVC dil, poor insp collapse  TTE 06/2019: LA 38, EF 65-70%, grade 2 DD, LVH (13-14)       RV mild dil, low nl fxn, TAPSE 17, mild-mod Tr, est PASP 64+CVP       Tr effusion. IVC nl size, poor insp collapse  TTE 04/2018 (cone): LA 40, EF 60-65%, grade 1 DD       RV normal, mild TR, est PASP within the normal range       No effusion. IVC normal    CXR 08/25/24: increased prominent of interstitial markings       No consolidation, no effusion  CXR 08/2023: small bilat effusions       Moderate pulmonary edema, cardiomegaly  CXR 01/2023: small effusions       Stable cardiomegaly. Bilat lower lung opacities, similar  CXR 04/2022: Similar bilat mid and lower lung opacities.        Small bilateral pleural effusions.        Stable enlarged cardiac silhouette.     CTA chest 10/2018 (cone): no demonstrable pulmonary embolus. There is  no thoracic aortic aneurysm or dissection. There is calcification at  the origin of the left subclavian artery. Other visualized great  vessels appear unremarkable. There is aortic atherosclerosis. There  are foci of coronary artery calcification evident. There is a small  amount of pericardial fluid, within the physiologic range, stable.  Pericardium does not appear appreciably thickened.  Prominence of the main pulmonary outflow tract is noted with a  measured diameter of 3.7 cm.  Mediastinum/Nodes: There is a subcentimeter nodular opacity in the  left lobe of the thyroid, stable. There are multiple subcentimeter  mediastinal lymph nodes again noted. There are prominent right  paratracheal lymph nodes with largest lymph node in this area  measuring 1.8 x 1.4 cm, marginally smaller than on prior study.  Aortopulmonary window lymph nodes appear slightly smaller than on  previous study. No new lymph node prominence is evident on this  study. No esophageal lesions are evident.  Lungs/Pleura: There is atelectatic change in the lung base regions,  stable. There is no frank edema or consolidation. A mild degree of  centrilobular emphysematous change appears stable. No appreciable  pleural effusion.  Upper Abdomen: There is a cyst in the medial upper pole right kidney  measuring 1.5 x 1.5 cm. A cyst is noted in the lateral mid left  kidney measuring 1.5 x 1.3 cm. There is aortic atherosclerosis as  well as calcification in the proximal visualized great vessels.  Visualized upper abdominal structures otherwise appear unremarkable.  Musculoskeletal: There is stable anterior wedging of the L1  vertebral body. There is  multilevel degenerative change in the  thoracic spine which appear stable. No blastic or lytic bone lesions  are evident. No evident chest wall lesions.     CT chest 04/2018 (cone): Cardiovascular: Mild cardiomegaly. Small pericardial  effusion/thickening. Left anterior descending and right coronary  atherosclerosis. Atherosclerotic nonaneurysmal thoracic aorta.  Dilated main pulmonary artery (3.6 cm diameter). No central  pulmonary emboli.  Mediastinum/Nodes: Subcentimeter hypodense posterior left thyroid  lobe nodule. Unremarkable esophagus. No axillary adenopathy.  Enlarged right paratracheal nodes up to 1.6 cm (series 2/image 54).  Enlarged 1.1 cm AP window node (series 2/image 51). No  pathologically enlarged hilar nodes.  Lungs/Pleura: No pneumothorax. Trace dependent left pleural  effusion. No right pleural effusion. No acute consolidative airspace  disease, lung masses or significant pulmonary nodules. Minimal  interlobular septal thickening throughout both lungs. Several mildly  thickened parenchymal bands in the dependent right middle lobe,  lingula and right lower lobe. No significant regions of  bronchiectasis. Mild centrilobular emphysema with mild diffuse  bronchial wall thickening.  Upper abdomen: Partially visualized simple 1.6 cm medial upper right  renal cyst. Mild scarring in the visualized upper right kidney.  Musculoskeletal: No aggressive appearing focal osseous lesions.  Stable mild chronic L1 vertebral compression fracture. Moderate  thoracic spondylosis.     V/Q scan : none    08/2019: 152 meters. HR 79->101, O2 sat 91->88% on 3LPM, needing 4LPM. Max Borg 2.    Pulmonary Function Testing:  Date FEV1 FVC TLC FRC RV DLCO    08/2024  1.12 (59%)  1.66 (66%)        04/2021  1.12 (66%)  1.91 (86%)        08/2019  0.99 (56%)  1.83 (79%)     5.0 (26%)    04/2018  0.98 (59%)  1.88 (89%)  68%   63%   (29%)              Overnight oximetry 01/2021 (NIPPV/5L): 48 min <89%, avg 90%, nadir 81%, ODI 15  Overnight oximetry 08/2019: 20 min <89% (total time 6.5 hours), avg 90%, nadir 94%       Compliance data with very good usage  PSG : done in past but not available     Relevant Serologies:   Labs 08/2019: ANA pos 1:160 homogenous, ENA neg, RF/CCP neg  Labs 12/2018: ANCA neg, ACE 31    Routine Labs:        Date NTproBNP Creatinine  Potassium  Bicarbonate  Hemoglobin  abs eos   10/2024  3260 2.1 4.2 25      07/2024  6300 2.1 4.5 27 10.9     05/2024 10100 2.2 5.4 26      04/2024          03/2024 8183 2.6 4.6 26 11.9 100    08/2023 4K->26K         01/2023 BNP 840->601 3.2 4.6 24 13.3 200    07/2022 BNP 167 2.4 4.4  26 13.6     04/2022 BNP 200 2.6   31 11.7     07/2021 BNP 445 2.6   30  9.7  300    03/2021 BNP 567 1.5 4.1  31  8.2  200    12/2020 BNP 926->479 1.3 3.8  33  8.9     11/2020  3800   BNP 567              07/2020  BNP 603  05/2020  1970  1.3  4.3  32  11.2     10/2019  2220  1.1    34        08/2019  1410              07/2019  1270  0.9  4.1  29  10.0     06/2019  6600->13K             11/2018  BNP 253              08/2018  BNP 531

## 2024-12-12 NOTE — Progress Notes (Signed)
 Internal Medicine Clinic Visit  Person Contacted: Patient  Contact Phone number: [phone] (home)   Is there someone else in the room? Yes. What is your relationship? daughter. Do you want this person here for the visit? no.  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 Blood pressure and heart rate stable.  - Continue torsemide  160 mg daily,metolazone  prn  - Monitor weight, especially if it reaches 168 pounds.  - Continue metolazone  once weekly.    Chronic kidney disease, stage IV  Creatinine at 2.08, GFR at 23. Gabapentin  increase not expected to impact kidney function.  - Ordered labs: kidney function, magnesium , pro BNP.  - Checked thyroid function due to borderline results.    Polyneuropathy  Pain in hands and fingers affecting function. Gabapentin  increase considered safe for kidneys.  Difficult to assess on video if she has arthritis  - Increased gabapentin  to 100 mg three times daily.  - Monitor for increased sleepiness.  - Provided link to [facility] Rheumatology videos for hand exercises.    Subclinical hypothyroid  -check TSH      Return in about 3 months (around 03/13/2025).    HPI:   Pt is a 83 y.o. female with a history of COPD on 3L home [place], CHF, [name], [name], T2DM    Diuretics increased last cardiology visit  Taking 160 mg torsemide  daily    History of Present Illness  Barbara Huber is an 83 year old female with neuropathy who presents for follow-up regarding her neuropathy and medication management. She is accompanied by Fae, her caregiver.    She experiences pain and soreness in her hands and fingers, impacting her ability to pick up objects. She currently takes gabapentin  100 mg twice daily, in the morning and at night.    She has a history of COPD and is on three liters of oxygen . Her breathing is reported as good during this visit.    She has CHF and was last seen by cardiology on November 7th, when her diuretics were increased. She takes torsemide  160 mg daily and metolazone  once a week. Her weight has fluctuated, with a recent weight of 166 pounds, up from her usual dry weight of 164 pounds.    Her last renal panel on November 7th showed a creatinine of 2.8 and a GFR of 23. She has [name].    She has type 2 diabetes, with a recent A1c in September that was within acceptable range.    No issues with blood pressure and heart rate, which have been stable.      Problem List:  Problem List[1]    Medications:  Reviewed in EPIC      Physical Exam:   Vital Signs:  BP Readings from Last 3 Encounters:   12/13/24 111/62   11/14/24 116/67   11/08/24 104/58      Wt Readings from Last 3 Encounters:   12/13/24 75.3 kg (166 lb)   11/03/24 75.2 kg (165 lb 11.2 oz)   10/18/24 75.8 kg (167 lb)      General appearance -   looks well, wearing oxygen     Records review  Lab Results   Component Value Date    A1C 4.6 (L) 09/14/2024        Health Maintenance Due   Topic Date Due    DEXA Scan  Never done    Retinal  Eye Exam  Never done    DTaP/Tdap/Td Vaccines (1 - Tdap) Never done    Medicare Annual Wellness Visit (AWV)  11/25/2018    Foot Exam  02/04/2022    COVID-19 Vaccine (5 - 2025-26 season) 08/28/2024      The ASCVD Risk score (Arnett DK, et al., 2019) failed to calculate.     Medication adherence and barriers to the treatment plan have been addressed. Opportunities to optimize healthy behaviors have been discussed. Patient / caregiver voiced understanding.      The patient reports they are physically located in [place]  and is currently: at home. I conducted a audio/video visit. I spent  65m 00s on the video call with the patient. I spent an additional 16 minutes on pre- and post-visit activities on the date of service .             [1]   Patient Active Problem List  Diagnosis    Spinal stenosis of lumbar region    Thoracic or lumbosacral neuritis or radiculitis    Lumbosacral spondylosis    COPD (chronic obstructive pulmonary disease) (CMS-HCC)    Sleep apnea in adult    Essential hypertension    Type 2 diabetes mellitus with stage 4 chronic kidney disease, with long-term current use of insulin  (CMS-HCC)    Chronic cystitis    Incomplete bladder emptying    Peripheral neuropathy    Chronic respiratory failure with hypoxia    (CMS-HCC)    Atrial fibrillation    (CMS-HCC)    Anemia in chronic kidney disease    Chronic kidney disease ([name]), stage IV (severe)    (CMS-HCC)    Pulmonary hypertension    (CMS-HCC)    Chronic prescription opiate use    Bronchitis    Chronic heart failure with preserved ejection fraction ([name]) (CMS-HCC)    Cardiac amyloidosis (CMS-HCC)    Recurrent cold sores    Dyspepsia    Iron  deficiency anemia due to chronic blood loss    Neuropathy    Left hip pain    Familial amyloid heart disease (CMS-HCC)    Hereditary amyloidosis    (CMS-HCC)    Osteoporosis with current pathological fracture    Acute exacerbation of CHF (congestive heart failure) (CMS-HCC)    Troponin level elevated    Thrombocytopenia    Difficulty walking    Pneumonia due to infectious organism    Dyspnea    Acute on chronic hypoxic respiratory failure    (CMS-HCC)    Leukocytosis    Subacute cough

## 2024-12-13 DIAGNOSIS — E854 Organ-limited amyloidosis: Principal | ICD-10-CM

## 2024-12-13 DIAGNOSIS — E038 Other specified hypothyroidism: Principal | ICD-10-CM

## 2024-12-13 DIAGNOSIS — G6289 Other specified polyneuropathies: Principal | ICD-10-CM

## 2024-12-13 DIAGNOSIS — I43 Cardiomyopathy in diseases classified elsewhere: Principal | ICD-10-CM

## 2024-12-13 DIAGNOSIS — N184 Chronic kidney disease, stage 4 (severe): Principal | ICD-10-CM

## 2024-12-13 MED ORDER — GABAPENTIN 100 MG CAPSULE
ORAL_CAPSULE | Freq: Three times a day (TID) | ORAL | 3 refills | 90.00000 days | Status: CP
Start: 2024-12-13 — End: 2025-12-13

## 2024-12-13 NOTE — Patient Instructions (Signed)
 Here is the link to the videos about hand arthritis. Osteoarthritis of the Hand with [facility] Rheumatologist Dr. Alan Moose - YouTube

## 2024-12-13 NOTE — Progress Notes (Signed)
 [place] [facility] at [facility]     Type of visit: video    Are you located in [place]? (for virtual visits only) Yes    Reason for visit: Follow up    Questions / Concerns that need to be addressed:     Screening BP- 111/62 57        PTHomeBP     HCDM reviewed and updated in Epic:    We are working to make sure all of our patients??? wishes are updated in Epic and part of that is documenting a Environmental Health Practitioner for each patient  A Health Care Decision Maker is someone you choose who can make health care decisions for you if you are not able - who would you most want to do this for you????  is already up to date.    HCDM (With Legal Document To Support): [name],[name] - Daughter - [phone]

## 2024-12-16 NOTE — ED Triage Note (Signed)
 Pt states she is feeling SOB and having weakness in her legs, unable to walk. Pt is normally on 3L O2 at home. Pt able to use rollator at home. Tonight, had a fall. -headstrike -thinners.

## 2024-12-16 NOTE — ED Provider Notes (Signed)
 [facility]  Emergency Department Provider Note     ED Clinical Impression     Final diagnoses:   Shortness of breath (Primary)   Fall, initial encounter   Acute cystitis with hematuria   Hypervolemia, unspecified hypervolemia type   Generalized weakness      Impression, Medical Decision Making, ED Course     Impression: 83 y.o. female with past medical history of Acute exacerbation of CHF (congestive heart failure) (CMS-HCC) (08/19/2023), Acute kidney injury superimposed on chronic kidney disease (10/11/2020), Acute on chronic diastolic (congestive) heart failure (CMS-HCC) (08/23/2020), Acute on chronic diastolic congestive heart failure (CMS-HCC) (08/23/2020), AKI (acute kidney injury) (04/14/2015), Anemia (08/22/2019), Arthritis, Calculus of kidney, Calculus of ureter, CHF (congestive heart failure) (CMS-HCC), Chronic atrial fibrillation    (CMS-HCC) (07/20/2019), COPD (chronic obstructive pulmonary disease) (CMS-HCC), Diabetes (CMS-HCC), Gangrenous cholecystitis (10/11/2020), Generalized edema  (06/17/2021), GERD (gastroesophageal reflux disease), Hydronephrosis, Hypertension, Hyponatremia (10/11/2020), Intermediate coronary syndrome (CMS-HCC) (03/13/2014), Lower extremity edema (09/28/2020), Lumbar stenosis, Microscopic hematuria, Nausea alone, Nephrolithiasis (04/17/2016), Neuropathy, Nocturia, Nocturia (07/01/2017), Other chronic cystitis, Persistent fatigue after COVID-19 (06/03/2021), Pulmonary hypertension    (CMS-HCC), Renal colic, Shortness of breath (04/28/2020), Sleep apnea, and Unstable angina pectoris    (CMS-HCC) (03/13/2014) who presents with SOB as described below.     Diagnostic workup as below.     ED Course as of 12/17/24 1447   Sat Dec 16, 2024   2300 Upon initial evaluation, patient is reporting shortness of breath and bilateral lower extremity weakness. Initial vital signs are significant for borderline low blood pressure. Will order laboratory studies, viral studies, urine studies, CXR, Tylenol  per patient'Huber request, and reassess.   2333 XR Chest 2 views  IMPRESSION:     Cardiomegaly with suggestion of mild vascular congestion.     Bibasilar opacities, likely atelectasis.   Sun Dec 17, 2024   0036 BNP elevated at 5600.  CBC unremarkable.   0036 EKG demonstrates sinus rhythm with first-degree AV block, rate 74, right superior axis deviation, without TWI or ST elevation/depression.   0110 RPP negative.  Initial troponin elevated at 62.   0225 UA with large leukocyte esterase, nitrite, blood, WBC, few bacteria, concerning for UTI. Will order ceftriaxone .   0321 CMP demonstrates elevated creatinine to 2.39, elevated anion gap of 18, elevated BUN at 66, otherwise unremarkable. Creatinine appears mildly worsened over the last month. Will order IV Lasix  and page MAO for admission.   9660 Repeat troponin elevated at 72.       MDM    Upon review of records, recent progress note on file is from 12/13/2024 (internal medicine note discussing management of heart failure with torsemide , metolazone , also discusses [name], polyneuropathy, subclinical hypothyroidism).     Differential diagnosis includes but is not limited to ACS, volume overload, PE, pericarditis, aortic dissection, pneumothorax, pneumonia, viral URI. Physical exam is significant for bibasilar crackles. Initial vital signs are significant for borderline low blood pressure.     Lower suspicion for ACS at this time given reassuring EKG and normal troponin. Lower suspicion for pulmonary embolism at this time given lack of tachycardia, hypoxia, tachypnea, hemoptysis, or history of DVT, PE, malignancy, recent prolonged immobilization, or hormone use.  Lower suspicion for pericarditis at this time given reassuring cardiac exam and EKG without ST elevation or PR depression.  Lower suspicion for aortic dissection at this time given stable vital signs, normal symmetric pulses on exam, and lack of mediastinal widening on CXR.  Lower suspicion for pneumonia or pneumothorax at this  time given CXR findings.  Lower suspicion for viral URI at this time given negative viral testing.     Suspect symptoms are likely secondary to volume overload. While in the ED, patient was given Tylenol , ceftriaxone , Lasix  with some improvement of symptoms. Plan is for admission to medicine for evaluation and management of her volume overload status.     MDM Elements  Independent Interpretation of Studies: see above  I have reviewed recent and relevant previous record, including: prior progress notes (discussing PMH and management)  Diagnostic tests considered but not performed: CT PE study (lower suspicion for pulmonary embolism at this time), CT dissection study (lower suspicion for aortic dissection at this time)  ____________________________________________    The case was discussed with the attending physician, Dr. Saul, who is in agreement with the above assessment and plan.      History     Chief Complaint: SOB    HPI   Barbara Huber is a 83 y.o. female with past medical history as stated below who presents with SOB.    Patient'Huber daughter at bedside reports increased shortness of breath starting earlier today that is worse with movement, as well as bilateral leg weakness. Patient uses a walker at home, but states she has not been able to stand up using her walker due to weakness. Patient'Huber daughter notes that she fell when trying to stand up earlier today, landing on her bottom softly. No head strike or LOC. She is not endorsing any pain or injuries from this fall. Patient is on 3L of home O2 at baseline. Separately, patient endorses left shoulder pain, which she states is a chronic problem for her.    Otherwise, patient has been feeling well. Patient denies fever, chills, cough, hemoptysis, nausea, vomiting, hematemesis, diarrhea, hematochezia, melena, constipation, abdominal pain, dysuria, back pain, extremity pain or swelling, chest pain, headache, lightheadedness, syncope, numbness, tingling. Patient denies history of pulmonary embolism, DVT, malignancy, recent prolonged immobilization, or hormone use.    Past Medical History[1]    Past Surgical History[2]      Current Facility-Administered Medications:     acetaminophen  (TYLENOL ) tablet 1,000 mg, 1,000 mg, Oral, Q8H PRN, Barbara Huber, Barbara S, MD    albuterol  2.5 mg /3 mL (0.083 %) nebulizer solution 2.5 mg, 2.5 mg, Nebulization, Q6H PRN, Barbara Huber, Barbara RAMAN, MD    aluminum-magnesium  hydroxide-simethicone (MAALOX MAX) 80-80-8 mg/mL oral suspension, 30 mL, Oral, Q4H PRN, Barbara Huber, Barbara S, MD    amiodarone  (PACERONE ) tablet 200 mg, 200 mg, Oral, Daily, Barbara Huber, Barbara S, MD, 200 mg at 12/17/24 1225    cyclobenzaprine  (FLEXERIL ) tablet 10 mg, 10 mg, Oral, BID PRN, Barbara Huber, Barbara S, MD    NOREEN ON 12/18/2024] ferrous sulfate  tablet 325 mg, 325 mg, Oral, Mon,Wed,Fri, Barbara Huber, Barbara RAMAN, MD    fluticasone  furoate-vilanterol (BREO ELLIPTA ) 100-25 mcg/dose inhaler 1 puff, 1 puff, Inhalation, Daily (RT), Barbara Huber, Barbara S, MD, 1 puff at 12/17/24 1230    [START ON 12/22/2024] fosfomycin  (MONUROL ) packet 3 g, 3 g, Oral, Weekly, Barbara Huber, Barbara RAMAN, MD    gabapentin  (NEURONTIN ) capsule 100 mg, 100 mg, Oral, TID, Barbara Huber, Barbara S, MD, 100 mg at 12/17/24 1337    guaiFENesin  (ROBITUSSIN) oral syrup, 200 mg, Oral, Q4H PRN, Barbara Huber, Barbara S, MD    heparin  (porcine) 5,000 unit/mL injection 5,000 Units, 5,000 Units, Subcutaneous, Q8H SCH, Barbara Huber, Barbara S, MD, 5,000 Units at 12/17/24 1337    melatonin tablet 3 mg, 3 mg, Oral, Nightly PRN, Barbara Huber, Barbara RAMAN, MD  midodrine  (PROAMATINE ) tablet 5 mg, 5 mg, Oral, TID, Barbara Huber, Barbara S, MD, 5 mg at 12/17/24 1223    nystatin  (MYCOSTATIN ) powder, , Topical, TID, Barbara Huber, Barbara S, MD    oxyCODONE  (ROXICODONE ) immediate release tablet 5 mg, 5 mg, Oral, Q6H PRN, Barbara Huber, Barbara S, MD, 5 mg at 12/17/24 1336    pantoprazole  (Protonix ) EC tablet 20 mg, 20 mg, Oral, Daily, Barbara Huber, Barbara S, MD, 20 mg at 12/17/24 1223    [START ON 12/18/2024] polyethylene glycol (MIRALAX ) packet 17 g, 17 g, Oral, Daily, Barbara Huber, Barbara S, MD    NOREEN ON 12/18/2024] spironolactone  (ALDACTONE ) split tablet 12.5 mg, 12.5 mg, Oral, Daily, Barbara Huber, Barbara S, MD    tafamidis  cap 61 mg **PATIENT SUPPLIED**, 61 mg, Oral, Daily, Barbara Huber, Barbara RAMAN, MD, 61 mg at 12/17/24 1338    umeclidinium (INCRUSE ELLIPTA ) 62.5 mcg/actuation inhaler 1 puff, 1 puff, Inhalation, Daily (RT), Barbara Huber, Barbara S, MD, 1 puff at 12/17/24 1230    Allergies  Nitrofurantoin and Lipitor  [atorvastatin ]    Family History  Family History[3]    Social History  Short Social History[4]     Physical Exam     VITAL SIGNS:      Vitals:    12/17/24 1100 12/17/24 1223 12/17/24 1315 12/17/24 1359   BP:  109/62 138/65    Pulse: 64 65 65    Resp: 17  16    Temp:   36.7 ??C (98.1 ??F)    TempSrc:   Axillary    SpO2: 99%  100%    Weight:   76.8 kg (169 lb 6.4 oz)    Height:    175.3 cm (5' 9)       Vital signs reviewed.     Constitutional: Alert and oriented. No acute distress.  HEENT: Normocephalic and atraumatic. Conjunctivae normal. Moist mucous membranes. No oropharyngeal erythema or edema. Neck supple without meningismus, non-tender, no significant cervical lymphadenopathy, no masse.   Cardiovascular: Regular rate, regular rhythm. No murmurs, no rubs, no gallops. Normal and symmetric distal pulses. Brisk capillary refill.   Respiratory: Normal respiratory effort. No wheezes. Bibasilar crackles.  Gastrointestinal: Soft, non-distended. Non-tender. No rebound tenderness or guarding. Normal bowel sounds. No palpable organomegaly or masses.  Musculoskeletal: Non-tender with normal range of motion in all extremities. No lower extremity swelling. No bony deformity.   Neurologic: Cranial nerves grossly intact. No gross focal neurologic deficits appreciated. Moving all extremities equally. Symmetric strength of bilateral lower extremities.  Skin: warm, dry. No rash. No abrasion or laceration.   Psychiatric: Mood and affect are normal. Speech and behavior are normal.     Radiology     XR Chest 2 views   Final Result      Cardiomegaly with suggestion of mild vascular congestion.      Bibasilar opacities, likely atelectasis.               PVL Venous Duplex Lower Extremity Bilateral    (Results Pending)       Pertinent labs & imaging results that were available during my care of the patient were independently interpreted by me and considered in my medical decision making (see chart for details).    Portions of this record have been created using Scientist, clinical (histocompatibility and immunogenetics). Dictation errors have been sought, but may not have been identified and corrected.    Documentation assistance was provided by Max Caza, Scribe on December 17, 2024 at 12:10 AM for Gladies Brewster, MD.    December 17, 2024 12:39  AM. Documentation assistance provided by the scribe. I was present during the time the encounter was recorded. The information recorded by the scribe was done at my direction and has been reviewed and validated by me.           [1]   Past Medical History:  Diagnosis Date    Acute exacerbation of CHF (congestive heart failure) (CMS-HCC) 08/19/2023    Acute kidney injury superimposed on chronic kidney disease 10/11/2020    Acute on chronic diastolic (congestive) heart failure (CMS-HCC) 08/23/2020    Acute on chronic diastolic congestive heart failure (CMS-HCC) 08/23/2020    AKI (acute kidney injury) 04/14/2015    Lab Results  Component  Value  Date     CREATININE  1.90 (H)  06/12/2021     Had a bump in her creatinine when she was taking her diuretics every day.  She is currently taking 40 mg daily of torsemide  and 50 mg of spironolactone .  Her volume status is fragile.  Previously when she stopped her diuretic she becomes short of breath.  Plan: We will check her BMP today.  We will likely have to go to 40    Anemia 08/22/2019    Iron  deficiency anemia          Lab Results      Component Value    Date           WBC    9.2    03/21/2021           RBC    3.67 (L)    03/21/2021           HGB    9.9 (L)    03/21/2021           HCT    30.7 (L)    03/21/2021           MCV    83.7    03/21/2021           MCH    26.9    03/21/2021           MCHC    32.1    03/21/2021           RDW    21.9 (H)    03/21/2021           PLT    318     Arthritis     Calculus of kidney     Calculus of ureter     CHF (congestive heart failure) (CMS-HCC)     Chronic atrial fibrillation    (CMS-HCC) 07/20/2019    COPD (chronic obstructive pulmonary disease) (CMS-HCC)     Diabetes (CMS-HCC)     Gangrenous cholecystitis 10/11/2020    Generalized edema  06/17/2021    GERD (gastroesophageal reflux disease)     Hydronephrosis     Hypertension     Hyponatremia 10/11/2020    Intermediate coronary syndrome (CMS-HCC) 03/13/2014    Lower extremity edema 09/28/2020    Lumbar stenosis     Microscopic hematuria     Nausea alone     Nephrolithiasis 04/17/2016    Neuropathy     Nocturia     Nocturia 07/01/2017    Other chronic cystitis     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  See plans for anemia, AKI  We are also tapering her gabapentin .  Currently on 300 mg 3 times daily.  We will decrease it to twice daily,  and then nightly.  She states she is not having any recurrence of her pain.    Pulmonary hypertension    (CMS-HCC)     Renal colic     Shortness of breath 04/28/2020    Sleep apnea     Unstable angina pectoris    (CMS-HCC) 03/13/2014   [2]   Past Surgical History:  Procedure Laterality Date    BACK SURGERY  1995    CARPAL TUNNEL RELEASE Left 2014    HYSTERECTOMY  1971    IR INSERT CHOLECYSTOSTOMY TUBE PERCUTANEOUS  10/02/2020    IR INSERT CHOLECYSTOSMY TUBE PERCUTANEOUS 10/02/2020 Ivonne Butler Seed, MD IMG VIR H&V [facility]    LUMBAR DISC SURGERY      PR REMOVAL GALLBLADDER N/A 10/06/2020    Procedure: CHOLECYSTECTOMY;  Surgeon: Curtistine Mira Dunnings, MD;  Location: MAIN OR [facility];  Service: Trauma PR RIGHT HEART CATH O2 SATURATION & CARDIAC OUTPUT N/A 09/30/2020    Procedure: Right Heart Catheterization;  Surgeon: Reyes Jerilynn Sage, MD;  Location: [facility] CATH;  Service: Cardiology    PR RIGHT HEART CATH O2 SATURATION & CARDIAC OUTPUT N/A 01/09/2021    Procedure: Right Heart Catheterization;  Surgeon: Norleen Deward Grumbling, MD;  Location: [facility] CATH;  Service: Cardiology   [3]   Family History  Problem Relation Age of Onset    Hypertension Mother     Cancer Father         COLON CANCER    No Known Problems Sister     No Known Problems Sister     No Known Problems Brother     No Known Problems Brother     No Known Problems Maternal Aunt     No Known Problems Maternal Uncle     No Known Problems Paternal Aunt     No Known Problems Paternal Uncle     No Known Problems Maternal Grandmother     No Known Problems Maternal Grandfather     No Known Problems Paternal Grandmother     No Known Problems Paternal Grandfather     No Known Problems Other     Anesthesia problems Neg Hx     Broken bones Neg Hx     Clotting disorder Neg Hx     Collagen disease Neg Hx     Diabetes Neg Hx     Dislocations Neg Hx     Fibromyalgia Neg Hx     Gout Neg Hx     Hemophilia Neg Hx     Osteoporosis Neg Hx     Rheumatologic disease Neg Hx     Scoliosis Neg Hx     Severe sprains Neg Hx     Sickle cell anemia Neg Hx     Spinal Compression Fracture Neg Hx     GU problems Neg Hx     Kidney cancer Neg Hx     Prostate cancer Neg Hx    [4]   Social History  Tobacco Use    Smoking status: Former     Current packs/day: 0.00     Average packs/day: 0.3 packs/day     Types: Cigarettes     Quit date: 2019     Years since quitting: 6.9    Smokeless tobacco: Never    Tobacco comments:     Quit a few years ago   Vaping Use    Vaping status: Never Used   Substance Use Topics    Alcohol use: No    Drug use: No  Kaikoa Magro, Gladies LABOR, MD  Resident  12/17/24 [phone]

## 2024-12-17 ENCOUNTER — Ambulatory Visit: Admit: 2024-12-17 | Payer: Medicare (Managed Care)

## 2024-12-17 ENCOUNTER — Ambulatory Visit: Admit: 2024-12-17 | Discharge: 2024-12-23 | Payer: Medicare (Managed Care)

## 2024-12-17 ENCOUNTER — Ambulatory Visit
Admission: EM | Admit: 2024-12-17 | Discharge: 2024-12-23 | Disposition: A | Discharge status: Home / Self Care | Payer: Medicare (Managed Care) | Admitting: Student in an Organized Health Care Education/Training Program

## 2024-12-17 ENCOUNTER — Inpatient Hospital Stay: Admit: 2024-12-17 | Discharge: 2024-12-23 | Payer: Medicare (Managed Care)

## 2024-12-17 ENCOUNTER — Inpatient Hospital Stay
Admission: EM | Admit: 2024-12-17 | Discharge: 2024-12-23 | Disposition: A | Discharge status: Home Health | Payer: MEDICAID | Admitting: Student in an Organized Health Care Education/Training Program

## 2024-12-17 LAB — CBC W/ AUTO DIFF
BASOPHILS ABSOLUTE COUNT: 0.1 10*9/L (ref 0.0–0.1)
BASOPHILS RELATIVE PERCENT: 0.8 %
EOSINOPHILS ABSOLUTE COUNT: 0.2 10*9/L (ref 0.0–0.5)
EOSINOPHILS RELATIVE PERCENT: 2.4 %
HEMATOCRIT: 34.8 % (ref 34.0–44.0)
HEMOGLOBIN: 11.6 g/dL (ref 11.3–14.9)
LYMPHOCYTES ABSOLUTE COUNT: 0.8 10*9/L — ABNORMAL LOW (ref 1.1–3.6)
LYMPHOCYTES RELATIVE PERCENT: 11.2 %
MEAN CORPUSCULAR HEMOGLOBIN CONC: 33.3 g/dL (ref 32.0–36.0)
MEAN CORPUSCULAR HEMOGLOBIN: 30.5 pg (ref 25.9–32.4)
MEAN CORPUSCULAR VOLUME: 91.7 fL (ref 77.6–95.7)
MEAN PLATELET VOLUME: 8.4 fL (ref 6.8–10.7)
MONOCYTES ABSOLUTE COUNT: 0.9 10*9/L — ABNORMAL HIGH (ref 0.3–0.8)
MONOCYTES RELATIVE PERCENT: 12.3 %
NEUTROPHILS ABSOLUTE COUNT: 5.2 10*9/L (ref 1.8–7.8)
NEUTROPHILS RELATIVE PERCENT: 73.3 %
NUCLEATED RED BLOOD CELLS: 0 /100{WBCs} (ref <=4)
PLATELET COUNT: 160 10*9/L (ref 150–450)
RED BLOOD CELL COUNT: 3.8 10*12/L — ABNORMAL LOW (ref 3.95–5.13)
RED CELL DISTRIBUTION WIDTH: 13.7 % (ref 12.2–15.2)
WBC ADJUSTED: 7.1 10*9/L (ref 3.6–11.2)

## 2024-12-17 LAB — HIGH SENSITIVITY TROPONIN I - SERIAL: HIGH SENSITIVITY TROPONIN I: 62 ng/L (ref <=34)

## 2024-12-17 LAB — URINALYSIS WITH MICROSCOPY WITH CULTURE REFLEX PERFORMABLE
BILIRUBIN UA: NEGATIVE
GLUCOSE UA: NEGATIVE
HYALINE CASTS: 11 /LPF — ABNORMAL HIGH (ref 0–1)
KETONES UA: NEGATIVE
NITRITE UA: POSITIVE — AB
PH UA: 5.5 (ref 5.0–9.0)
PROTEIN UA: NEGATIVE
RBC UA: 7 /HPF — ABNORMAL HIGH (ref <=4)
SPECIFIC GRAVITY UA: 1.011 (ref 1.003–1.030)
SQUAMOUS EPITHELIAL: 1 /HPF (ref 0–5)
UROBILINOGEN UA: <2
WBC UA: 107 /HPF — ABNORMAL HIGH (ref 0–5)

## 2024-12-17 LAB — COMPREHENSIVE METABOLIC PANEL
ALBUMIN: 3.1 g/dL — ABNORMAL LOW (ref 3.4–5.0)
ALKALINE PHOSPHATASE: 99 U/L (ref 46–116)
ALT (SGPT): <7 U/L — ABNORMAL LOW (ref 10–49)
ANION GAP: 18 mmol/L — ABNORMAL HIGH (ref 5–14)
AST (SGOT): 19 U/L (ref <=34)
BILIRUBIN TOTAL: 0.3 mg/dL (ref 0.3–1.2)
BLOOD UREA NITROGEN: 66 mg/dL — ABNORMAL HIGH (ref 9–23)
BUN / CREAT RATIO: 28
CALCIUM: 9.2 mg/dL (ref 8.7–10.4)
CHLORIDE: 96 mmol/L — ABNORMAL LOW (ref 98–107)
CO2: 22.6 mmol/L (ref 20.0–31.0)
CREATININE: 2.39 mg/dL — ABNORMAL HIGH (ref 0.55–1.02)
EGFR CKD-EPI (2021) FEMALE: 20 mL/min/1.73m2 — ABNORMAL LOW (ref >=60)
GLUCOSE RANDOM: 107 mg/dL (ref 70–179)
POTASSIUM: 3.8 mmol/L (ref 3.4–4.8)
PROTEIN TOTAL: 7.8 g/dL (ref 5.7–8.2)
SODIUM: 137 mmol/L (ref 135–145)

## 2024-12-17 LAB — PRO-BNP: PRO-BNP: 5642 pg/mL — ABNORMAL HIGH (ref <=300.0)

## 2024-12-17 LAB — HIGH SENSITIVITY TROPONIN I - 2 HOUR SERIAL
HIGH SENSITIVITY TROPONIN - DELTA (0-2H): 10 ng/L — ABNORMAL HIGH (ref <=7)
HIGH-SENSITIVITY TROPONIN I - 2 HOUR: 72 ng/L (ref <=34)

## 2024-12-17 LAB — HIGH SENSITIVITY TROPONIN I - 6 HOUR SERIAL
HIGH SENSITIVITY TROPONIN - DELTA (2-6H): 1 ng/L (ref <=7)
HIGH-SENSITIVITY TROPONIN I - 6 HOUR: 73 ng/L (ref <=34)

## 2024-12-17 MED ADMIN — furosemide (LASIX) injection 40 mg: 40 mg | INTRAVENOUS | @ 09:00:00 | Stop: 2024-12-17

## 2024-12-17 MED ADMIN — heparin (porcine) 5,000 unit/mL injection 5,000 Units: 5000 [IU] | SUBCUTANEOUS | @ 19:00:00

## 2024-12-17 MED ADMIN — acetaminophen (TYLENOL) tablet 1,000 mg: 1000 mg | ORAL | @ 22:00:00

## 2024-12-17 MED ADMIN — midodrine (PROAMATINE) tablet 5 mg: 5 mg | ORAL | @ 17:00:00

## 2024-12-17 MED ADMIN — midodrine (PROAMATINE) tablet 5 mg: 5 mg | ORAL | @ 22:00:00

## 2024-12-17 MED ADMIN — pantoprazole (Protonix) EC tablet 20 mg: 20 mg | ORAL | @ 17:00:00

## 2024-12-17 MED ADMIN — tafamidis cap 61 mg **PATIENT SUPPLIED**: 61 mg | ORAL | @ 19:00:00

## 2024-12-17 MED ADMIN — fluticasone furoate-vilanterol (BREO ELLIPTA) 100-25 mcg/dose inhaler 1 puff: 1 | RESPIRATORY_TRACT | @ 18:00:00

## 2024-12-17 MED ADMIN — nystatin (MYCOSTATIN) powder: TOPICAL | @ 22:00:00

## 2024-12-17 MED ADMIN — amiodarone (PACERONE) tablet 200 mg: 200 mg | ORAL | @ 17:00:00

## 2024-12-17 MED ADMIN — oxyCODONE (ROXICODONE) immediate release tablet 5 mg: 5 mg | ORAL | @ 19:00:00 | Stop: 2024-12-20

## 2024-12-17 MED ADMIN — cefTRIAXone (ROCEPHIN) 1 g in sodium chloride 0.9 % (NS) 100 mL IVPB-MBP: 1 g | INTRAVENOUS | @ 08:00:00 | Stop: 2024-12-17

## 2024-12-17 MED ADMIN — umeclidinium (INCRUSE ELLIPTA) 62.5 mcg/actuation inhaler 1 puff: 1 | RESPIRATORY_TRACT | @ 18:00:00

## 2024-12-17 MED ADMIN — gabapentin (NEURONTIN) capsule 100 mg: 100 mg | ORAL | @ 19:00:00 | Stop: 2024-12-17

## 2024-12-17 MED ADMIN — acetaminophen (TYLENOL) tablet 650 mg: 650 mg | ORAL | @ 06:00:00 | Stop: 2024-12-17

## 2024-12-17 NOTE — H&P (Signed)
 Geriatrics ([facility]) History & Physical    Assessment & Plan:   Barbara Huber is a 83 y.o. female whose presentation is complicated by [name] (amyloid), COPD, pHTN, [name]4, A fib that presented to [facility] with acute onset shortness of breath and bl weakness.     Active Problems:    * No active hospital problems. *        Active Problems    Sudden Onset Shortness of Breath - Weakness - c/f Volume Overload  Patient presented with sudden onset shortness of breath and weakness evening of 12/20.  Afebrile with heart rate in the 60s and normal blood pressure on arrival, satting within appropriate range on baseline 3 L. Differential remains broad given hyperacute nature includes PE and flash pulmonary edema.  PE is especially concerning as patient remains off eliquis  (taken previously for A fib). Also concerned for arrhythmia, orthostatic hypotension, vagal, general volume overloaded state with elevated BNP at 5600, although chest x-ray is not particularly convincing for volume overload and patient has no lower extremity edema, exam notable for JVD but patient has history of pulmonary hypertension making volume exam difficult.  With bilateral leg weakness and resolution on admission, stroke is less likely.  Patient without fever, wheeze, or sputum production making COPD exacerbation less likely. No clinical evidence of infection or sepsis.  Of note, if patient does develop evidence of infection she has previously grown Pseudomonas in her sputum as well as resistant Enterobacter.  Diuresed with 40 mg IV lasix  in ED. plan to monitor I's and O's s/p diuresis and obtain VQ scan as well as orthostatic vitals.  - s/p IV lasix  40 mg x1  - VQ scan  - Orthostatics  - telemetry  - daily CBC, BMP, Mg     Abnormal UA  Patient noted to have UA on admission with large leukocyte esterase and positive nitrate, few bacteria.  Patient afebrile, asymptomatic without dysuria or increased urinary frequency.  WBC within normal limits at 7.1.  Given 1 dose of ceftriaxone  in the ED.  Given lack of symptoms and clinical evidence of infection, will defer antibiotics at this time.  If patient becomes symptomatic, will plan to resume ceftriaxone .  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  Does not want invasive procedures like PPM, AVN ablation, not interested in a watchman.  - continue amiodarone     [name]4  Baseline Cr 2-2.2. Cr on admission 2.39.   - daily BMP, Mg          Mentation CAM:   CAM: Negative  6CIT: 6CIT Total Score: 2 (Dec 17, 2024 1426 : Joesph Steen RAMAN, RN)  Normal (score 0-7)  Delirium Order Set: Ordered (appropriate for any patient >65)  Dementia: No.  Behavioral Symptoms (baseline or now): No.     Mobility Baseline functional status: Needs Assistance with ADLs  Current functional status: Needs Assistance with ADLs  PT/OT Ordered: Yes   Medications Reviewed home medications, screening for potentially inappropriate medications: Yes  Medications recommended de-prescribing:    What Matters Geri Assessment complete: No, needs to be done  Chronic Problems    COPD: bl 3L; continue home inhalers. 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    Polyneuropathy: continue gabapentin  prn  OSA: uses trilogy at night; CPAP nightly inpatient   IDA: daily CBC, off eliquis ; continue PO iron  MWF  Chronic Back Pain: continue gabapentin  PRN, flexeril  PRN, oxy PRN    The patient's presentation is complicated by the following clinically significant conditions requiring additional evaluation and treatment: - Disorders of electrolytes, volume status, and acid/base status: - Volume overload requiring further investigation, treatment, or monitoring  - overweight/obese/morbidly obese: overweight POA affecting nursing needs, DME, and attention to weight based medication dosing -- Body mass index is 25.02 kg/m??.   - Hypercoagulable state requiring additional attention to DVT prophylaxis and treatment or chronic anticoagulation secondary to Atrial Fibrillation   - Chronic kidney disease POA requiring further investigation, treatment, or monitoring   - Chronic heart failure POA requiring further investigation, treatment, or monitoring  - Age related debility POA requiring additional resources: DME, PT, or OT  - Hypoxia requiring further investigation, treatment, or monitoring         Issues Impacting Complexity of Management:  -High risk of complications from pain and/or analgesia likely to result in delirium  -The patient is at high risk from Hospital immobility in an elderly patient given baseline poor functional status with a high risk of causing delirium and further decline in function  -Need for the following intensive monitoring parameter(s) due to high risk of clinical decline: telemetry      Medical Decision Making: Assessment required an independent historian, additional information obtained from family/friend, daughter      Checklist:  Diet: Regular Diet  DVT PPx: Heparin  5000units q8h  Code Status: Full Code  Dispo: Patient appropriate for Inpatient based on expectation of ongoing need for hospitalization greater than two midnights based on severity of presentation/services including need for [name]    Team Contact Information:   Primary Team: Geriatrics ([facility])  Primary Resident: Almarie GORMAN Andes, MD  Resident's Pager: [phone] (Geriatrics Senior Resident)    Chief Concern:   No Principal Problem: There is no principal problem currently on the Problem List. Please update the Problem List and refresh.      Subjective:   Barbara Huber is a 83 y.o. female with pertinent PMHx of  [name] (amyloid), COPD, pHTN, [name]4, A fib presenting with sudden onset shortness of breath and weakness.        History obtained by patient and daughter.       HPI:    Patient was in her usual state of health until approximately 7 PM 12/28 when she stood up and suddenly developed weakness and shortness of breath.  Shortness of breath is with exertion.  She was able to lay down in bed but then developed shortness of breath again at 9:30 PM felt like she could not get out of bed.  Notably she did not hit her head or lose consciousness, she did not fall she just felt short of breath and weak.    Review of systems negative for fever, chills, headache, dizziness, vision changes, blurry vision, chest pain, nausea, vomiting, abdominal pain, rashes or lesions on upper or lower extremities, dysuria, polyuria.    Notably, patient has been off Eliquis  due to anemia.  She has COPD and her baseline oxygen  is 3 L.    At baseline she uses a walker at home.  In the ED: Chest x-ray notable for mild vascular congestion, BNP elevated at 5600, RVP negative, troponin plateaued in 70s, UA with leukocyte esterase and nitrates, ceftriaxone  ordered.  Creatinine 2.39 with anion gap of 18, IV Lasix  ordered.      Pertinent Surgical Hx  RH Cath 2022  Cholecystectomy 2021   right heart cath 2021  Hysterectomy 1995  Lumbar disc surgery    Pertinent Family Hx  N/A    Pertinent Social Hx   Lives with daughter    Allergies  Nitrofurantoin and Lipitor  [atorvastatin ]    I reviewed the Medication List. The current list is Accurate  Prior to Admission medications   Medication Dose, Route, Frequency   acetaminophen  (TYLENOL ) 500 MG tablet 1,000 mg, Oral, 3 times a day PRN   albuterol  HFA 90 mcg/actuation inhaler 1 puff, Inhalation, Every 6 hours PRN   amiodarone  (PACERONE ) 200 MG tablet 200 mg, Oral, Daily (standard)   cyclobenzaprine  (FLEXERIL ) 10 MG tablet 10 mg, Oral, 2 times a day PRN   fluticasone  propion-salmeterol (ADVAIR  HFA) 115-21 mcg/actuation inhaler INHALE 2 PUFFS TWICE A DAY   gabapentin  (NEURONTIN ) 100 MG capsule 100 mg, Oral, 3 times a day (standard)   midodrine  (PROAMATINE ) 5 MG tablet 5 mg, Oral, 3 times a day   oxyCODONE  (ROXICODONE ) 5 MG immediate release tablet 5 mg, Every 8 hours PRN   pantoprazole  (PROTONIX ) 20 MG tablet 20 mg, Oral, Daily (standard)   spironolactone  (ALDACTONE ) 25 MG tablet 12.5 mg, Oral, Daily (standard)   tiotropium bromide  (SPIRIVA  RESPIMAT) 2.5 mcg/actuation inhalation mist 2 puffs, Inhalation, Daily (standard)   torsemide  (DEMADEX ) 20 MG tablet Take 4 tablets (80 mg total) by mouth daily AND 3 tablets (60 mg total) daily with lunch.   vitamin A -3,000 mcg RAE, 10,000 UNIT, 3,000 mcg RAE (10,000 UNIT) capsule 3,000 mcg of RAE, Daily (standard)   vutrisiran  (AMVUTTRA ) 25 mg/0.5 mL injection 25 mg, Take as directed   albuterol  2.5 mg /3 mL (0.083 %) nebulizer solution 3 mL, Every 6 hours PRN   blood sugar diagnostic (ACCU-CHEK GUIDE TEST STRIPS) Strp Use to check blood sugar daily   cranberry 500 mg cap 500 mg, Daily   ergocalciferol -1,250 mcg, 50,000 unit, (DRISDOL ) 1,250 mcg (50,000 unit) capsule 1,250 mcg, Oral, Every 14 days  Patient not taking: Reported on 12/17/2024   ferrous sulfate  325 (65 FE) MG tablet 325 mg, Oral, Daily   fosfomycin  (MONUROL ) 3 gram Pack 3 g, Oral, Weekly   HYDROcodone -chlorpheniramine  polistirex (TUSSIONEX PENNKINETIC ) 10-8 mg/5 mL ER suspension 5 mL, Oral, Every 12 hours PRN   inhalational spacing device (AEROCHAMBER MV) Spcr 1 spacer for inhaler   lancets (ACCU-CHEK SOFTCLIX LANCETS) Misc CHECK BLOOD SUGAR DAILY   lidocaine  (ASPERCREME) 4 % patch 1 patch, Transdermal, Daily (standard), Remove after 12 hours.   metOLazone  (ZAROXOLYN ) 5 MG tablet 5 mg, Oral, Daily PRN   NARCAN 4 mg/actuation nasal spray 1 spray, Once as needed   nystatin  (MYCOSTATIN ) 100,000 unit/gram powder Apply to affected area 3 times daily   OXYGEN -AIR DELIVERY SYSTEMS MISC 4 L, Continuous  Patient taking differently: 3 L by Miscellaneous route continuous.   potassium chloride  20 MEQ ER tablet 20 mEq, Oral, Daily (standard)   rosuvastatin  (CRESTOR ) 5 MG tablet TAKE 1 TABLET EVERY OTHER DAY   sodium chloride  3 % NEBULIZER solution 4 mL, Nebulization, Every 6 hours PRN, To help raise mucus, use following the albuterol  nebulizer treatment, along with the Aerobika device   tafamidis  (VYNDAMAX ) 61 mg cap Take  1 capsule (61 mg) by mouth daily.       Designated Healthcare Decision Maker:  Ms. Munford currently has decisional capacity for healthcare decision-making and is able to designate a surrogate healthcare decision maker. Ms. Oshel designated healthcare decision maker(s) is/are Economist (the patient's adult child) as denoted by stated patient preference.    Objective:   Physical Exam:  Temp:  [36.7 ??C (98.1 ??F)] 36.7 ??C (98.1 ??F)  Pulse:  [59-80] 65  SpO2 Pulse:  [62-100] 100  Resp:  [14-18] 16  BP: (102-138)/(52-65) 138/65  SpO2:  [96 %-100 %] 100 %    Physical Exam  Constitutional:       General: She is not in acute distress.     Appearance: She is not ill-appearing or toxic-appearing.      Interventions: Nasal cannula in place.   Neck:      Vascular: JVD present.   Cardiovascular:      Rate and Rhythm: Normal rate and regular rhythm.   Pulmonary:      Effort: Pulmonary effort is normal.      Breath sounds: Examination of the right-lower field reveals rales. Examination of the left-lower field reveals rales. Rales present. No wheezing or rhonchi.   Abdominal:      Palpations: Abdomen is soft.      Tenderness: There is no abdominal tenderness.   Skin:     Findings: No lesion or rash.   Neurological:      General: No focal deficit present.      Mental Status: She is alert. Mental status is at baseline.      Motor: No weakness.   Psychiatric:         Attention and Perception: Attention and perception normal.

## 2024-12-17 NOTE — Plan of Care (Signed)
 Shift Summary  oxyCODONE  was administered PRN in the afternoon for increased pain, resulting in a temporary decrease in pain score.    Diminished bilateral breath sounds and exceptions to WDL were noted in the afternoon.    Fall reduction program, bed alarm, and hourly visual checks were maintained throughout the shift, with no falls or injuries documented.    Overall, pain management required multiple interventions, respiratory status was closely monitored, and safety measures were consistently upheld.     Improved Ability to Complete Activities of Daily Living: Assistance was required for dressing, grooming, bathing, toileting, transferring, and mobility throughout the shift, while feeding and oral care remained independent; minimal assist was needed for most mobility tasks, and moderate assist for hygiene. No significant changes in ADL independence were noted during the shift.     Optimal Pain Control and Function: Pain fluctuated, increasing from 0 in the morning to 6 in the afternoon, briefly decreasing to 4 after oxyCODONE  administration.    Optimal Gas Exchange: Respiratory rate increased slightly during the shift, SpO2 remained high and stable, but diminished bilateral breath sounds and exceptions to WDL were documented in the afternoon.     Absence of Fall and Fall-Related Injury: Fall reduction program, bed alarms, and hourly visual checks were consistently maintained, with toileting provided every two hours and side rails up for safety; no falls or injuries were documented.

## 2024-12-18 LAB — BASIC METABOLIC PANEL
ANION GAP: 15 mmol/L — ABNORMAL HIGH (ref 5–14)
BLOOD UREA NITROGEN: 71 mg/dL — ABNORMAL HIGH (ref 9–23)
BUN / CREAT RATIO: 33
CALCIUM: 9.1 mg/dL (ref 8.7–10.4)
CHLORIDE: 96 mmol/L — ABNORMAL LOW (ref 98–107)
CO2: 26.7 mmol/L (ref 20.0–31.0)
CREATININE: 2.13 mg/dL — ABNORMAL HIGH (ref 0.55–1.02)
EGFR CKD-EPI (2021) FEMALE: 23 mL/min/1.73m2 — ABNORMAL LOW (ref >=60)
GLUCOSE RANDOM: 103 mg/dL (ref 70–179)
POTASSIUM: 4.1 mmol/L (ref 3.4–4.8)
SODIUM: 138 mmol/L (ref 135–145)

## 2024-12-18 LAB — PHOSPHORUS: PHOSPHORUS: 4.3 mg/dL (ref 2.4–5.1)

## 2024-12-18 LAB — TSH: THYROID STIMULATING HORMONE: 5.265 u[IU]/mL — ABNORMAL HIGH (ref 0.550–4.780)

## 2024-12-18 LAB — MAGNESIUM: MAGNESIUM: 2.2 mg/dL (ref 1.6–2.6)

## 2024-12-18 LAB — D-DIMER, QUANTITATIVE: D-DIMER QUANTITATIVE (ACL TOP): 2813 ng{FEU}/mL — ABNORMAL HIGH (ref <=500)

## 2024-12-18 MED ADMIN — heparin (porcine) 5,000 unit/mL injection 5,000 Units: 5000 [IU] | SUBCUTANEOUS | @ 19:00:00

## 2024-12-18 MED ADMIN — heparin (porcine) 5,000 unit/mL injection 5,000 Units: 5000 [IU] | SUBCUTANEOUS | @ 11:00:00

## 2024-12-18 MED ADMIN — heparin (porcine) 5,000 unit/mL injection 5,000 Units: 5000 [IU] | SUBCUTANEOUS | @ 02:00:00

## 2024-12-18 MED ADMIN — melatonin tablet 3 mg: 3 mg | ORAL | @ 02:00:00

## 2024-12-18 MED ADMIN — lidocaine (ASPERCREME) 4 % 1 patch: 1 | TRANSDERMAL | @ 23:00:00

## 2024-12-18 MED ADMIN — acetaminophen (TYLENOL) tablet 1,000 mg: 1000 mg | ORAL | @ 23:00:00

## 2024-12-18 MED ADMIN — acetaminophen (TYLENOL) tablet 1,000 mg: 1000 mg | ORAL | @ 11:00:00

## 2024-12-18 MED ADMIN — piperacillin-tazobactam (ZOSYN) IVPB (premix) 2.25 g: 2.25 g | INTRAVENOUS | @ 23:00:00 | Stop: 2024-12-25

## 2024-12-18 MED ADMIN — piperacillin-tazobactam (ZOSYN) IVPB (premix) 2.25 g: 2.25 g | INTRAVENOUS | @ 18:00:00 | Stop: 2024-12-25

## 2024-12-18 MED ADMIN — midodrine (PROAMATINE) tablet 5 mg: 5 mg | ORAL | @ 23:00:00

## 2024-12-18 MED ADMIN — midodrine (PROAMATINE) tablet 5 mg: 5 mg | ORAL | @ 16:00:00

## 2024-12-18 MED ADMIN — midodrine (PROAMATINE) tablet 5 mg: 5 mg | ORAL | @ 11:00:00

## 2024-12-18 MED ADMIN — pantoprazole (Protonix) EC tablet 20 mg: 20 mg | ORAL | @ 15:00:00

## 2024-12-18 MED ADMIN — gabapentin (NEURONTIN) capsule 100 mg: 100 mg | ORAL | @ 02:00:00

## 2024-12-18 MED ADMIN — gabapentin (NEURONTIN) capsule 100 mg: 100 mg | ORAL | @ 11:00:00

## 2024-12-18 MED ADMIN — gabapentin (NEURONTIN) capsule 100 mg: 100 mg | ORAL | @ 18:00:00

## 2024-12-18 MED ADMIN — tafamidis cap 61 mg **PATIENT SUPPLIED**: 61 mg | ORAL | @ 15:00:00

## 2024-12-18 MED ADMIN — fluticasone furoate-vilanterol (BREO ELLIPTA) 100-25 mcg/dose inhaler 1 puff: 1 | RESPIRATORY_TRACT | @ 15:00:00

## 2024-12-18 MED ADMIN — nystatin (MYCOSTATIN) powder: TOPICAL | @ 19:00:00

## 2024-12-18 MED ADMIN — nystatin (MYCOSTATIN) powder: TOPICAL | @ 02:00:00

## 2024-12-18 MED ADMIN — nystatin (MYCOSTATIN) powder: TOPICAL | @ 15:00:00

## 2024-12-18 MED ADMIN — amiodarone (PACERONE) tablet 200 mg: 200 mg | ORAL | @ 15:00:00

## 2024-12-18 MED ADMIN — oxyCODONE (ROXICODONE) immediate release tablet 5 mg: 5 mg | ORAL | @ 11:00:00 | Stop: 2024-12-20

## 2024-12-18 MED ADMIN — oxyCODONE (ROXICODONE) immediate release tablet 5 mg: 5 mg | ORAL | @ 02:00:00 | Stop: 2024-12-20

## 2024-12-18 MED ADMIN — oxyCODONE (ROXICODONE) immediate release tablet 5 mg: 5 mg | ORAL | @ 18:00:00 | Stop: 2024-12-20

## 2024-12-18 MED ADMIN — cyclobenzaprine (FLEXERIL) tablet 10 mg: 10 mg | ORAL | @ 15:00:00

## 2024-12-18 MED ADMIN — spironolactone (ALDACTONE) split tablet 12.5 mg: 12.5 mg | ORAL | @ 15:00:00

## 2024-12-18 MED ADMIN — ferrous sulfate tablet 325 mg: 325 mg | ORAL | @ 15:00:00

## 2024-12-18 MED ADMIN — gadopiclenol (ELUCIREM,VUEWAY) injection 7.5 mL: 7.5 mL | INTRAVENOUS | @ 17:00:00 | Stop: 2024-12-18

## 2024-12-18 MED ADMIN — umeclidinium (INCRUSE ELLIPTA) 62.5 mcg/actuation inhaler 1 puff: 1 | RESPIRATORY_TRACT | @ 15:00:00

## 2024-12-18 MED ADMIN — Tc-99m Macroagragated Albumin (MAA): 4.4 | INTRAVENOUS | @ 14:00:00 | Stop: 2024-12-18

## 2024-12-18 MED ADMIN — torsemide (DEMADEX) tablet 80 mg: 80 mg | ORAL | @ 15:00:00 | Stop: 2024-12-18

## 2024-12-18 NOTE — Hospital Course (Addendum)
 Barbara Huber is a 83 y.o. female with PMH of [name] (amyloid), COPD, pHTN, [name]4, and afib that presented to [facility] with acute onset shortness of breath and bilateral lower extremity weakness. Please see below for a summary of her hospital course by problem.     Sudden Onset Shortness of Breath - c/f Volume Overload  Patient presented with sudden onset shortness of breath and weakness evening of 12/20.  Afebrile with heart rate in the 60s and normal blood pressure on arrival, satting within appropriate range on baseline 3 L. V/Q scan with low probability for PE. Also concerned for arrhythmia, orthostatic hypotension, vagal, general volume overloaded state with elevated BNP at 5600, although chest x-ray is not particularly convincing for volume overload and patient has no lower extremity edema, exam notable for JVD but patient has history of pulmonary hypertension making volume exam difficult. Initially given IV however held after Cr increased. On 12/26, restarted diuresis once Cr was down trending. Patient brought her home scale and weighed 169. Dry weight of 166/167. Discharged with regimen patient was taking prior to hospitalization, 100mg  torsemide  in AM, 40 in PM. Plan to have close f/up with cardiologist.     Bilateral leg weakness (improved)  Sudden onset weakness noted in bilateral legs prior to admission. Given weakness with hip flexion on exam and bilateral ankle myoclonus, considered neurologic etiology including upper motor neuron injury, radiculopathy, lumbar strain, transverse myelitis (though less likely with symptom onset). CT head without acute findings. MRI lumbar spine demonstrated vertebral compression fractures at L2/L3 along with multilevel spondylolisthesis with associated neural foraminal narrowing, though appears relatively stable from prior and likely does not explain acute symptoms. Possibly related to acute urinary infection as below. Improved with supportive care and treatment of infection. Discharged with home PT/OT.     UTI  Patient noted to have UA on admission with large leukocyte esterase and positive nitrate, few bacteria. Patient afebrile, asymptomatic without dysuria or increased urinary frequency. Urine culture growing Pseudomonas and Klebsiella. Possible that urinary infection is contributing to above symptoms/presentation as workup has otherwise been largely unremarkable. Initially started on IV Zosyn , transitioned to PO ciprofloxacin to complete remainder of 5 day course (end of treatment 12/26) after susceptibilities returned.     AKI on [name]  Baseline serum Cr variable, most recently appears 1.8-2.2. Mildly elevated at 2.4 on admission. Initially improved following [name], then intervally worsened. Stabilized today.  Suspect she has a very tenuous volume status given her underlying [name]/amyloid and pulmonary hypertension and is sensitive to small fluid shifts, may have a pre-renal component after receiving [name] on admission. Repeat proBNP mildly elevated. By time of discharge, Cr was down trending. Restarted PO diuretics. Plan for f/up labs within the week.     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 Diuretic regimen was adjusted during admission in the setting of AKI, but discharged on prior home regimen of torsemide  and spiro. Metolazone  held. Continued on Midodrine  as well.      Persistent Atrial Fibrillation  Patient was previously on anticoagulation with Eliquis , which was stopped due to recurrent anemia, most recently stopped 06/2024.  Does not want invasive procedures like PPM, AVN ablation, not interested in a watchman. Continued on amiodarone  during admission.

## 2024-12-18 NOTE — Plan of Care (Signed)
 Shift Summary  Pain decreased from severe to moderate after administration of oxyCODONE  and gabapentin .  Mobility and activity remained limited, requiring moderate assistance and with generalized weakness persisting throughout the shift.  Fall prevention strategies were consistently implemented, and no falls or injuries occurred during the shift.  Family visited throughout the shift, providing support.  Overall, pain was managed and safety interventions were maintained, but mobility and strength remained limited.    Improved Ability to Complete Activities of Daily Living: Mobility remained slightly limited and moderate assistance was required for both activity and hygiene, with generalized weakness noted throughout the shift.    Optimal Pain Control and Function: Aching pain in the left side was constant and severe at the start of the shift, but pain score decreased from 10 to 5 after administration of oxyCODONE  and gabapentin , though the patient declined further intervention later in the shift.    Absence of Fall and Fall-Related Injury: Fall reduction interventions were maintained, including low bed, bed alarm, and scheduled toileting, with no falls or injuries documented during the shift.

## 2024-12-18 NOTE — Plan of Care (Signed)
 Shift Summary  PRN pain and sleep medications were administered, resulting in improved comfort and rest.   Fall prevention strategies were consistently implemented, including bed alarms, low bed, and frequent checks, with no falls reported.   Family provided ongoing support throughout the shift, and patient required minimal to moderate assistance for activities and hygiene.   Overall, comfort improved and safety was maintained during the shift.    Optimal Comfort and Wellbeing: Right foot pain was reported as constant and severe early in the shift, with a pain score of 8; after administration of PRN oxycodone  and gabapentin , patient was sleeping and pain timer was restarted, suggesting improvement in comfort.    Rounds/Family Conference: Family was present and visiting throughout the shift, providing ongoing support and communication.    Improved Ability to Complete Activities of Daily Living: Mobility and activity remained slightly limited, with minimal assistance required for activity and moderate assistance for hygiene; patient was able to reposition self and walk occasionally during the shift.    Absence of Fall and Fall-Related Injury: Fall reduction program and safety interventions were consistently maintained, including bed alarms, low bed, and frequent visual checks; no falls or injuries occurred during the shift.

## 2024-12-18 NOTE — Progress Notes (Signed)
 [name]    Assessment & Plan:   Barbara Huber is a 83 y.o. female whose presentation is complicated by [name] (amyloid), COPD, pHTN, [name]4, A fib that presented to [facility] with acute onset shortness of breath and bl weakness.     Active Problems:    * No active hospital problems. *      Active Problems     Sudden Onset Shortness of Breath - c/f Volume Overload  Patient presented with sudden onset shortness of breath and weakness evening of 12/20.  Afebrile with heart rate in the 60s and normal blood pressure on arrival, satting within appropriate range on baseline 3 L. Differential remains broad given hyperacute nature includes PE and flash pulmonary edema.  PE is especially concerning as patient remains off eliquis  (taken previously for A fib). Also concerned for arrhythmia, orthostatic hypotension, vagal, general volume overloaded state with elevated BNP at 5600, although chest x-ray is not particularly convincing for volume overload and patient has no lower extremity edema, exam notable for JVD but patient has history of pulmonary hypertension making volume exam difficult.  Patient without fever, wheeze, or sputum production making COPD exacerbation less likely. No clinical evidence of infection or sepsis. Of note, if patient does develop evidence of infection she has previously grown Pseudomonas in her sputum as well as resistant Enterobacter.  Diuresed with 40 mg IV lasix  in ED. plan to monitor I's and O's s/p diuresis and obtain VQ scan as well as orthostatic vitals.  - Torsemide  80mg  today, start torsemide  80mg  BID tomorrow  - VQ scan pending  - Orthostatics  - telemetry  - daily CBC, BMP, Mg     Bilateral leg weakness   Sudden onset weakness noted in bilateral legs. Given weakness with hip flexion on exam and bilateral ankle myoclonus, consider neurologic etiology including upper motor neuron injury, radiculopathy, lumbar strain, transverse myelitis (though less likely with symptom onset). Though stroke was noted to be lower on differential on admission, focal symptoms with myoclonus warrant workup as below.   - CT Head Wo Contrast   - MRI Brain L -spine without contrast     Abnormal UA  Patient noted to have UA on admission with large leukocyte esterase and positive nitrate, few bacteria.  Patient afebrile, asymptomatic without dysuria or increased urinary frequency.  WBC within normal limits at 7.1.  Given 1 dose of ceftriaxone  in the ED. Urine culture with Pseudomonas.   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  Does not want invasive procedures like PPM, AVN ablation, not interested in a watchman.  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 Cr on admission 2.39.   - daily BMP, Mg         Mentation CAM:     6CIT: 6CIT Total Score: 2 (Dec 17, 2024 1426 : Joesph Steen RAMAN, RN)  Delirium Order Set: Ordered (appropriate for any patient >65)  Dementia: Yes.  Behavioral Symptoms (baseline or now): Yes.   Mobility Baseline functional status: Needs Assistance with ADLs  Current functional status: Needs Assistance with ADLs  PT/OT Ordered: Yes   Medications Reviewed home medications, screening for potentially inappropriate medications: Yes  Medications recommended de-prescribing: none   What Matters Geri Assessment complete: No, needs to be done       Chronic Problems     COPD: baseline 3L; continue home inhalers. 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    Polyneuropathy: continue gabapentin  prn  OSA: uses trilogy at night; CPAP nightly inpatient   IDA: daily CBC, off eliquis ; continue PO iron  MWF  Chronic Back Pain: continue gabapentin  PRN, flexeril  PRN, oxy PRN     The patient's presentation is complicated by the following clinically significant conditions requiring additional evaluation and treatment: - Disorders of electrolytes, volume status, and acid/base status: - Volume overload requiring further investigation, treatment, or monitoring  - overweight/obese/morbidly obese: overweight POA affecting nursing needs, DME, and attention to weight based medication dosing -- Body mass index is 25.02 kg/m??.   - Hypercoagulable state requiring additional attention to DVT prophylaxis and treatment or chronic anticoagulation secondary to Atrial Fibrillation   - Chronic kidney disease POA requiring further investigation, treatment, or monitoring   - Chronic heart failure POA requiring further investigation, treatment, or monitoring  - Age related debility POA requiring additional resources: DME, PT, or OT  - Hypoxia requiring further investigation, treatment, or monitoring         Issues Impacting Complexity of Management:  -High risk of complications from pain and/or analgesia likely to result in delirium  -The patient is at high risk from Hospital immobility in an elderly patient given baseline poor functional status with a high risk of causing delirium and further decline in function  -Need for the following intensive monitoring parameter(s) due to high risk of clinical decline: telemetry        Medical Decision Making: Assessment required an independent historian, additional information obtained from family/friend, daughter    Daily Checklist:  Diet: Regular Diet  DVT PPx: Heparin  5000units q8h  Electrolytes: Replete Potassium to >/= 3.6 and Magnesium  to >/= 1.8  Code Status: Full Code  Dispo: floor    Team Contact Information:   Primary Team: Geriatrics ([facility])  Primary Resident: Greig JINNY Blanch, DO  Resident's Pager: [phone] (Geriatrics Intern - Tower)    Interval History:   No acute events overnight. This morning, Ms. Mcclusky endorses sudden onset weakness in her legs. She denies any worsening SOB this morning or any chest pain.     ROS: Denies headache, chest pain, abdominal pain, nausea, vomiting.    Objective:   Temp:  [36.5 ??C (97.7 ??F)-36.8 ??C (98.2 ??F)] 36.5 ??C (97.7 ??F)  Pulse:  [64-71] 71  SpO2 Pulse:  [100] 100  Resp:  [16-17] 16  BP: (115-138)/(53-68) 129/68  SpO2:  [97 %-100 %] 97 % on 3L [place]    Gen: in NAD, answers questions appropriately  Eyes: sclera anicteric, EOMI  HENT: atraumatic, MMM, OP w/o erythema or exudate   Heart: RRR, S1, S2, mild rales   Lungs: CTAB, no crackles or wheezes, no use of accessory muscles  Abdomen: Normoactive bowel sounds, soft, NTND, no rebound/guarding  Neuro/MSK: 4/5 strength with HF, 5/5 with KE, KF, PF and DF. Sensation grossly intact to light touch. Myoclonus noted in bilateral ankles.   Extremities: no clubbing, cyanosis, or edema in the BLEs  Psych: Alert, oriented, appropriate mood and affect    Labs/Studies: Labs and Studies from the  last 24hrs per EMR and Reviewed

## 2024-12-19 LAB — BASIC METABOLIC PANEL
ANION GAP: 17 mmol/L — ABNORMAL HIGH (ref 5–14)
BLOOD UREA NITROGEN: 68 mg/dL — ABNORMAL HIGH (ref 9–23)
BUN / CREAT RATIO: 28
CALCIUM: 9.3 mg/dL (ref 8.7–10.4)
CHLORIDE: 95 mmol/L — ABNORMAL LOW (ref 98–107)
CO2: 24.9 mmol/L (ref 20.0–31.0)
CREATININE: 2.44 mg/dL — ABNORMAL HIGH (ref 0.55–1.02)
EGFR CKD-EPI (2021) FEMALE: 19 mL/min/1.73m2 — ABNORMAL LOW (ref >=60)
GLUCOSE RANDOM: 103 mg/dL (ref 70–179)
POTASSIUM: 3.8 mmol/L (ref 3.4–4.8)
SODIUM: 137 mmol/L (ref 135–145)

## 2024-12-19 LAB — PHOSPHORUS: PHOSPHORUS: 4.3 mg/dL (ref 2.4–5.1)

## 2024-12-19 LAB — MAGNESIUM: MAGNESIUM: 2.2 mg/dL (ref 1.6–2.6)

## 2024-12-19 MED ADMIN — heparin (porcine) 5,000 unit/mL injection 5,000 Units: 5000 [IU] | SUBCUTANEOUS | @ 11:00:00

## 2024-12-19 MED ADMIN — heparin (porcine) 5,000 unit/mL injection 5,000 Units: 5000 [IU] | SUBCUTANEOUS | @ 04:00:00

## 2024-12-19 MED ADMIN — heparin (porcine) 5,000 unit/mL injection 5,000 Units: 5000 [IU] | SUBCUTANEOUS | @ 18:00:00

## 2024-12-19 MED ADMIN — melatonin tablet 3 mg: 3 mg | ORAL | @ 02:00:00

## 2024-12-19 MED ADMIN — lidocaine (ASPERCREME) 4 % 1 patch: 1 | TRANSDERMAL | @ 14:00:00

## 2024-12-19 MED ADMIN — acetaminophen (TYLENOL) tablet 1,000 mg: 1000 mg | ORAL | @ 14:00:00

## 2024-12-19 MED ADMIN — piperacillin-tazobactam (ZOSYN) IVPB (premix) 2.25 g: 2.25 g | INTRAVENOUS | Stop: 2024-12-25

## 2024-12-19 MED ADMIN — piperacillin-tazobactam (ZOSYN) IVPB (premix) 2.25 g: 2.25 g | INTRAVENOUS | @ 11:00:00 | Stop: 2024-12-25

## 2024-12-19 MED ADMIN — piperacillin-tazobactam (ZOSYN) IVPB (premix) 2.25 g: 2.25 g | INTRAVENOUS | @ 06:00:00 | Stop: 2024-12-25

## 2024-12-19 MED ADMIN — piperacillin-tazobactam (ZOSYN) IVPB (premix) 2.25 g: 2.25 g | INTRAVENOUS | @ 18:00:00 | Stop: 2024-12-25

## 2024-12-19 MED ADMIN — midodrine (PROAMATINE) tablet 5 mg: 5 mg | ORAL

## 2024-12-19 MED ADMIN — midodrine (PROAMATINE) tablet 5 mg: 5 mg | ORAL | @ 17:00:00

## 2024-12-19 MED ADMIN — pantoprazole (Protonix) EC tablet 20 mg: 20 mg | ORAL | @ 14:00:00

## 2024-12-19 MED ADMIN — gabapentin (NEURONTIN) capsule 100 mg: 100 mg | ORAL | @ 14:00:00

## 2024-12-19 MED ADMIN — gabapentin (NEURONTIN) capsule 100 mg: 100 mg | ORAL | @ 02:00:00

## 2024-12-19 MED ADMIN — tafamidis cap 61 mg **PATIENT SUPPLIED**: 61 mg | ORAL | @ 14:00:00

## 2024-12-19 MED ADMIN — fluticasone furoate-vilanterol (BREO ELLIPTA) 100-25 mcg/dose inhaler 1 puff: 1 | RESPIRATORY_TRACT | @ 13:00:00

## 2024-12-19 MED ADMIN — nystatin (MYCOSTATIN) powder: TOPICAL | @ 14:00:00

## 2024-12-19 MED ADMIN — nystatin (MYCOSTATIN) powder: TOPICAL | @ 18:00:00

## 2024-12-19 MED ADMIN — nystatin (MYCOSTATIN) powder: TOPICAL | @ 02:00:00

## 2024-12-19 MED ADMIN — amiodarone (PACERONE) tablet 200 mg: 200 mg | ORAL | @ 14:00:00

## 2024-12-19 MED ADMIN — oxyCODONE (ROXICODONE) immediate release tablet 5 mg: 5 mg | ORAL | @ 02:00:00 | Stop: 2024-12-20

## 2024-12-19 MED ADMIN — oxyCODONE (ROXICODONE) immediate release tablet 5 mg: 5 mg | ORAL | @ 14:00:00 | Stop: 2024-12-20

## 2024-12-19 MED ADMIN — spironolactone (ALDACTONE) split tablet 12.5 mg: 12.5 mg | ORAL | @ 14:00:00

## 2024-12-19 MED ADMIN — umeclidinium (INCRUSE ELLIPTA) 62.5 mcg/actuation inhaler 1 puff: 1 | RESPIRATORY_TRACT | @ 13:00:00

## 2024-12-19 MED ADMIN — polyethylene glycol (MIRALAX) packet 17 g: 17 g | ORAL | @ 14:00:00

## 2024-12-19 MED ADMIN — torsemide (DEMADEX) tablet 80 mg: 80 mg | ORAL | @ 11:00:00

## 2024-12-19 NOTE — Consults (Signed)
 OCCUPATIONAL THERAPY  Evaluation (12/19/24 1333)    Patient Name:  Barbara Huber       Medical Record Number: [id number]     Date of Birth: [birth year]  Sex: Female      Post-Discharge Occupational Therapy Recommendations: 3x weekly (with continued 24/7 supervision/assistance)          Equipment Recommendation  OT DME Recommendations: None       OT Treatment Diagnosis: Generalized muscle weakness, Limitation of activities due to disability, Need for assistance with personal care, Reduced mobility         Assessment  Assessment: Per chart, Barbara Huber is a 83 y.o. female whose presentation is complicated by [name] (amyloid), COPD, pHTN, [name]4, A fib that presented to [facility] with acute onset shortness of breath and bl weakness.         Pt reports that prior to admission she received assistance for BADLs and IADLs. Pt has 24/7 CGs. Pt completed functional transfers using her rollator, uses MWC for longer distances, and endorses a fall ~3 days prior to admission. Pt enjoys doing activities on her iPad.        Pt seated in recliner upon OT arrival educated on the role of OT and OT evaluation in the acute care setting with Pt agreeable to session. Pt presents to acute OT with the above condition(s) and below baseline function level with ADLs and functional mobility.  Observed deficits, including: decreased functional strength, decreased functional mobility, decreased endurance, decreased activity tolerance, limited AROM, and altered sensation, currently impacting Pt's independence and safety with ADLs and functional transfers compared with baseline. Pt will benefit from skilled acute OT services to address observed deficits and maximize independence and safety with ADLs. Recommend 3x with continued 24/7 supervision/assist. DME: None.     After review of the patient's occupational profile and history, assessment of occupational performance, clinical decision making, and development of POC, the patient presents as a Moderate complexity case.    Problem List: Decreased strength, Decreased range of motion, Decreased activity tolerance, Decreased endurance, Decreased mobility, Impaired ADLs, Impaired balance, Fall risk, Impaired sensation        Clinical Decision Making: Moderate Complexity    Skilled Interventions Performed: ADL retraining, Balance activities, Compensatory tech. training, Education - Patient, Functional mobility, Education - Family / caregiver, Postular / Proximal stability, Range of motion, Safety education, Transfer training    Today's Interventions: Pt educated on role of OT, OT eval, OT POC, and safety; Pt seated in recliner upon OT arrival, re-educated on purpose and role of OT, and agreeable to session. Pt engaged in therapeutic functional mobility and ADL tasks, with OT providing skilled interventions including: modification of ADL routines, graded assist / fading support, safe transfer training techniques, verbal/visual/tactile cues/feedback, environmental modification, safe use of assistive device, participation in self-care routines, and therapeutic use of self and Pt education on Role of OT, OT POC, Safety, progression of mobility, and strategies to reduce risk of falls throughout this session. Pt completed: managing socks with max A while seated; sit <> stand from recliner with max A using rollator; functional transfer recliner <> BSC with min A using rollator and vcs; sit <> stand form BSC with mod A using Rollator. Pt fatigued following mobility and deferred additional ADL tasks offered. Pt positioned for comfort at end of session. Pt and daughter at bedside report feeling comfortable with Pt at home at her current level of functioning with her current 24/7 CG assist.    Activity Tolerance During  Today's Session  Limited by fatigue    Plan  Planned Frequency of Treatment:    1-2x per day Weekly Frequency: 3-4 days per week       Planned Interventions:  Education (Patient/Family/Caregiver), Home Exercise Program, Therapeutic Activity, Therapeutic Exercise, Self-Care/Home Training      GOALS:   Patient and Family Goals: to return home    Short Term:   SHORT GOAL #1: Pt will complete BSC t/f with CGA using LRAD; manage clothing and all toileting tasks with CGA.   Time Frame : 2 weeks  SHORT GOAL #2: Pt will complete oral care/grooming task seated with set up A using LRAD.   Time Frame : 2 weeks  SHORT GOAL #3: Pt will complete UB dressing with min A, using AE as needed.   Time Frame : 2 weeks  SHORT GOAL #4: Pt will complete LB dressing with min A using LRAD and AE as needed.   Time Frame : 2 weeks    Long Term Goal #1: Pt will maximize independence with ADLs and functional transfers within 8 weeks.  Time Frame: 2 months    Prognosis:  Good  Positive Indicators:  supportive family, participatory  Barriers to Discharge: Endurance deficits, Functional strength deficits, Inability to safely perform ADLS    Subjective  Medical Updates Since Last Visit/Relevant PMH Affecting Clinical Decision Making:    Prior Functional Status Pt reports that prior to admission she received assistance for BADLs and IADLs. Pt has 24/7 CGs. Pt completed functional transfers using her rollator, uses MWC for longer distances, and endorses a fall ~3 days prior to admission. Pt enjoys doing activities on her iPad.    Living Situation  Living environment: House  Lives With: Family  Home Living: One level home, Ramped entrance, Walk-in shower, Grab bars in shower, Built-in shower seat, Shower chair with back, Handicapped height toilet, Bedside commode  Caregiver Identified?: Yes  Caregiver Availability: 24 hours  Caregiver Ability: Assist with ADLs  Caregiver Identified?: Yes  Equipment available at home: Oxygen , Bedside commode, Conservator, Museum/gallery, Rollator    Medical Tests / Procedures: Reviewed in EPIC       Patient / Caregiver reports: Pt and RN agreeable to OT session.      Past Medical History[1] Social History     Tobacco Use Smoking status: Former     Current packs/day: 0.00     Average packs/day: 0.3 packs/day     Types: Cigarettes     Quit date: 2019     Years since quitting: 6.9    Smokeless tobacco: Never    Tobacco comments:     Quit a few years ago   Substance Use Topics    Alcohol use: No      Past Surgical History[2] Family History[3]     Nitrofurantoin and Lipitor  [atorvastatin ]     Objective Findings  Precautions / Restrictions  Falls precautions      Weight Bearing   (non-applicable)      Required Braces or Orthoses  Non-applicable    Communication Preference  Verbal, Written, Visual       Pain  Pt reports no pain this session.    Equipment / Environment  Vascular access (PIV, TLC, Port-a-cath, PICC), Purewick/Condom catheter, Supplemental oxygen , Telemetry (3L supplemental O2)    Cognition   Orientation Level:  Oriented x 4   Arousal/Alertness:  Appropriate responses to stimuli   Attention Span:  Appears intact   Memory:  Appears intact   Following Commands:  Follows one-step commands   Safety Judgment:  Good awareness of safety precautions   Awareness of Errors and Problem Solving:  Able to problem solve independently    Vision / Hearing   Vision: Wears glasses all the time, Glasses present     Hearing: No deficit identified         Hand Function:  Right Hand Function: Right hand function impaired  Right Hand Impairment: grip strength poor  Left Hand Function: Left hand function impaired  Left Hand Impairment: grip strength poor  Hand Function comments: Decreased flexion to B hands, unable to make a complete fist, limiting grip strength  Hand Dominance: Right    Skin Inspection:  Skin Inspection: Intact where visualized    Face/Cervical ROM:  Face ROM: WFL  Cervical ROM comments: NT    ROM / Strength:  UE ROM/Strength: Right Impaired/Limited, Left Impaired/Limited  RUE Impairment: Reduced strength, Limited AROM  LUE Impairment: Reduced strength, Limited AROM  LE ROM/Strength: Left WFL, Right WFL  LE ROM/ Strength Comment: Genrealized weakness and Pt quick to fatigue with functional mobility    Sensation:  RUE Sensation: RUE impaired  RUE Sensation Impairment: chronic peripheral neuropathy  LUE Sensation: LUE impaired  LUE Sensation Impairment: chronic peripheral neuropathy  RLE Sensation: RLE impaired  RLE Sensation Impairment: chronic peripheral neuropathy  LLE Sensation: LLE impaired  LLE Sensation Impairment: chronic peripheral neuropathy    Balance:  Static Sitting-Level of Assistance: Stand by Camera Operator of Assistance: Stand by Surveyor, Mining of Assistance: Minimum assistance  Dynamic Standing - Level of Assistance: Minimum assistance  Standing Balance comments: using rollator    Functional Mobility  Transfers: Mod assist, Max assist (initially max A sit <> stand from recliner using rollator, then mod A sit <> stand from [facility] using rollater)  Bed Mobility :  (NT, Pt seated in recliner upon OT arrival and at end of session)    Ambulation  Level of Assistance: Minimal assist, patient does 75% or more  Assistive Device: Four wheel walker    ADLs  Toileting: Mod assist, Performed seated, Performed standing  LB Dressing: Max assist, Performed seated    IADLs: NT    Vitals / Orthostatics  Vitals/Orthostatics: nAD    Patient at end of session: All needs in reach, Friends/Family present, In chair, Lines intact, Notified Nurse     Occupational Therapy Session Duration  OT Individual [mins]: 39           AM-PAC Daily Activity Assessment  Lower Body Dressing assistance needs: A lot - Maximum/Moderate Assistance  Bathing assistance needs: A lot - Maximum/Moderate Assistance  Toileting assistance needs: A lot - Maximum/Moderate Assistance  Upper Body Dressing assistance needs: A lot - Maximum/Moderate Assistance  Personal Grooming assistance needs: A Little - Minimal/Contact Guard Assist/Supervision  Eating Meals assistance needs: None - Modified Independent/Independent      Daily Activity Score: 15    Score (in points): % of Functional Impairment, Limitation, Restriction  5: 100% impaired, limited, restricted  6-7: At least 80%, but less than 100% impaired, limited restricted  8-11: At least 60%, but less than 80% impaired, limited restricted  12-16: At least 40%, but less than 60% impaired, limited restricted  17-18: At least 20%, but less than 40% impaired, limited restricted  19: At least 1%, but less than 20% impaired, limited restricted  20: 0% impaired, limited restricted    'AM-PAC' forms are Copyright protected by [employer]  I attest that I have reviewed the above information.  Signed: Kurtis Anastasia, OT  Filed 12/19/2024                 [1]   Past Medical History:  Diagnosis Date    Acute exacerbation of CHF (congestive heart failure) (CMS-HCC) 08/19/2023    Acute kidney injury superimposed on chronic kidney disease 10/11/2020    Acute on chronic diastolic (congestive) heart failure (CMS-HCC) 08/23/2020    Acute on chronic diastolic congestive heart failure (CMS-HCC) 08/23/2020    AKI (acute kidney injury) 04/14/2015    Lab Results  Component  Value  Date     CREATININE  1.90 (H)  06/12/2021     Had a bump in her creatinine when she was taking her diuretics every day.  She is currently taking 40 mg daily of torsemide  and 50 mg of spironolactone .  Her volume status is fragile.  Previously when she stopped her diuretic she becomes short of breath.  Plan: We will check her BMP today.  We will likely have to go to 40    Anemia 08/22/2019    Iron  deficiency anemia          Lab Results      Component    Value    Date           WBC    9.2    03/21/2021           RBC    3.67 (L)    03/21/2021           HGB    9.9 (L)    03/21/2021           HCT    30.7 (L)    03/21/2021           MCV    83.7    03/21/2021           MCH    26.9    03/21/2021           MCHC    32.1    03/21/2021           RDW    21.9 (H)    03/21/2021           PLT    318     Arthritis     Calculus of kidney Calculus of ureter     CHF (congestive heart failure) (CMS-HCC)     Chronic atrial fibrillation    (CMS-HCC) 07/20/2019    COPD (chronic obstructive pulmonary disease) (CMS-HCC)     Diabetes (CMS-HCC)     Gangrenous cholecystitis 10/11/2020    Generalized edema  06/17/2021    GERD (gastroesophageal reflux disease)     Hydronephrosis     Hypertension     Hyponatremia 10/11/2020    Intermediate coronary syndrome (CMS-HCC) 03/13/2014    Lower extremity edema 09/28/2020    Lumbar stenosis     Microscopic hematuria     Nausea alone     Nephrolithiasis 04/17/2016    Neuropathy     Nocturia     Nocturia 07/01/2017    Other chronic cystitis     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  See plans for anemia, AKI  We are also tapering her gabapentin .  Currently on 300 mg 3 times daily.  We will decrease it to twice daily, and then nightly.  She states she is not having any recurrence of  her pain.    Pulmonary hypertension    (CMS-HCC)     Renal colic     Shortness of breath 04/28/2020    Sleep apnea     Unstable angina pectoris    (CMS-HCC) 03/13/2014   [2]   Past Surgical History:  Procedure Laterality Date    BACK SURGERY  1995    CARPAL TUNNEL RELEASE Left 2014    HYSTERECTOMY  1971    IR INSERT CHOLECYSTOSTOMY TUBE PERCUTANEOUS  10/02/2020    IR INSERT CHOLECYSTOSMY TUBE PERCUTANEOUS 10/02/2020 Ivonne Butler Seed, MD IMG VIR H&V [facility]    LUMBAR DISC SURGERY      PR REMOVAL GALLBLADDER N/A 10/06/2020    Procedure: CHOLECYSTECTOMY;  Surgeon: Curtistine Mira Dunnings, MD;  Location: MAIN OR [facility];  Service: Trauma    PR RIGHT HEART CATH O2 SATURATION & CARDIAC OUTPUT N/A 09/30/2020    Procedure: Right Heart Catheterization;  Surgeon: Reyes Jerilynn Sage, MD;  Location: [facility] CATH;  Service: Cardiology    PR RIGHT HEART CATH O2 SATURATION & CARDIAC OUTPUT N/A 01/09/2021    Procedure: Right Heart Catheterization;  Surgeon: Norleen Deward Grumbling, MD;  Location: [facility] CATH;  Service: Cardiology [3]   Family History  Problem Relation Age of Onset    Hypertension Mother     Cancer Father         COLON CANCER    No Known Problems Sister     No Known Problems Sister     No Known Problems Brother     No Known Problems Brother     No Known Problems Maternal Aunt     No Known Problems Maternal Uncle     No Known Problems Paternal Aunt     No Known Problems Paternal Uncle     No Known Problems Maternal Grandmother     No Known Problems Maternal Grandfather     No Known Problems Paternal Grandmother     No Known Problems Paternal Grandfather     No Known Problems Other     Anesthesia problems Neg Hx     Broken bones Neg Hx     Clotting disorder Neg Hx     Collagen disease Neg Hx     Diabetes Neg Hx     Dislocations Neg Hx     Fibromyalgia Neg Hx     Gout Neg Hx     Hemophilia Neg Hx     Osteoporosis Neg Hx     Rheumatologic disease Neg Hx     Scoliosis Neg Hx     Severe sprains Neg Hx     Sickle cell anemia Neg Hx     Spinal Compression Fracture Neg Hx     GU problems Neg Hx     Kidney cancer Neg Hx     Prostate cancer Neg Hx

## 2024-12-19 NOTE — Consults (Signed)
 Care Management  Initial Transition Planning Assessment              CM spoke with patient and daughter/HCDM, [name], at bedside.  Patient is A&Ox4 and lives with daughter in a 1 level home with ramp.  [facility] discharged patient not long ago per daughter and they noted [facility] as their continued preference.  Patient received some assistance from family with ADLS PRN and all IADLS.  Patient is on 3 liters N/C at baseline with [employer] as DME company.  Daughter states will have portable with her to transport patient home upon discharge.    General  Care Manager / Social Worker assessed the patient by : In person interview with patient, In person interview with family, Medical record review, Discussion with Clinical Care team (CM spoke with patient and daughter, Dennison, at bedside.)  Orientation Level: Oriented X4  Functional level prior to admission: Partially Assisted  Who provides care at home?: Family member  Level of assistance required: Bathing, Continence, Dressing, Transferring, Walking (Some ADLS. All IADLS.)  Reason for referral: Home Health, Discharge Planning    Contact/Decision Maker  Extended Emergency Contact Information  Primary Emergency Contact: [name],[name]  Address: [address]           [place], [place] [address] [place]  Home Phone: [phone]  Mobile Phone: [phone]  Relation: Daughter    Legal Next of Kin / Guardian / POA / Advance Directives     HCDM (With Legal Document To Support): [name],[name] - Daughter - [phone]    Advance Directive (Medical Treatment)  Does patient have an advance directive covering medical treatment?: Patient has advance directive covering medical treatment, copy in chart.  Advance directive covering medical treatment not in Chart:: Copy requested from family    Health Care Decision Maker [HCDM] (Medical & Mental Health Treatment)  Healthcare Decision Maker: HCDM documented in the HCDM/Contact Info section.  Information offered on HCDM, Medical & Mental Health advance directives:: Patient declined information.    Advance Directive (Mental Health Treatment)  Does patient have an advance directive covering mental health treatment?: Patient does not have advance directive covering mental health treatment.  Reason patient does not have an advance directive covering mental health treatment:: HCDM documented in the HCDM/Contact Info section.    Readmission Information    Have you been hospitalized in the last 30 days?: No       Did the following happen with your discharge?      Patient Information  Lives with: Children (Daughter, [name].)    Type of Residence: Private residence        Location/Detail: 1 level home with ramped entrance.    Support Systems/Concerns: Children, Family Members    Responsibilities/Dependents at home?: No    Home Care services in place prior to admission?: Other (Comment) (Patient and daughter report [facility] had stopped services prior to admission.)        Outpatient/Community Resources in place prior to admission: Clinic  Agency detail (Name/Phone #): PCP.    Equipment Currently Used at Home: commode chair, grab bar, toilet, grab bar, tub/shower, shower chair, respiratory supplies, raised toilet seat, oxygen , wheelchair, manual, other (see comments) (Ramp; rollator.)  Current HME Agency (Name/Phone #): [employer].    Currently receiving outpatient dialysis?: No       Financial Information       Need for financial assistance?: No       Social Drivers of Health  Social Drivers  of Health were addressed in provider documentation.  Please refer to patient history.    Complex Discharge Information    Is patient identified as a difficult/complex discharge?: No       Interventions:       Discharge Needs Assessment  Concerns to be Addressed: discharge planning    Clinical Risk Factors: > 65, New Diagnosis, Principal Diagnosis: Cancer, Stroke, COPD, Heart Failure, AMI, Pneumonia, Joint Replacment, Multiple Diagnoses (Chronic), History of Falls, Functional Limitations    Barriers to taking medications: No    Prior overnight hospital stay or [name] in last 90 days: Yes       Anticipated Changes Related to Illness: inability to care for self    Equipment Needed After Discharge: none    Discharge Facility/Level of Care Needs:      Readmission  Risk of Unplanned Readmission Score: UNPLANNED READMISSION SCORE: 28.96%  Predictive Model Details          29% (High)  Factor Value    Calculated 12/19/2024 12:04 18% Number of active inpatient medication orders 35    [place] Risk of Unplanned Readmission Model 17% Number of ED visits in last six months 4     11% Number of hospitalizations in last year 3     7% ECG/EKG order present in last 6 months     6% Latest BUN high (68 mg/dL)     6% Diagnosis of electrolyte disorder present     5% Imaging order present in last 6 months     5% Age 83     5% Phosphorous result present     4% Charlson Comorbidity Index 4     3% Diagnosis of deficiency anemia present     3% Active anticoagulant inpatient medication order present     3% Latest creatinine high (2.44 mg/dL)     3% Diagnosis of renal failure present     2% Current length of stay 2.006 days     2% Future appointment scheduled     1% Active ulcer inpatient medication order present      Readmitted Within the Last 30 Days? (No if blank)   Patient at risk for readmission?: Yes    Discharge Plan  Screen findings are: Discharge planning needs identified or anticipated (Comment). (Discharge home with [facility], daughter transporting.)    Expected Discharge Date: 12/22/2024    Expected Transfer from Critical Care:         Patient and/or family were provided with choice of facilities / services that are available and appropriate to meet post hospital care needs?: Yes   List choices in order highest to lowest preferred, if applicable. : [facility].    Initial Assessment complete?: Yes

## 2024-12-19 NOTE — Progress Notes (Signed)
 [name]    Assessment & Plan:   Barbara Huber is a 83 y.o. female whose presentation is complicated by [name] (amyloid), COPD, pHTN, [name]4, A fib that presented to [facility] with acute onset shortness of breath and bl weakness.     Principal Problem:    Shortness of breath  Active Problems:    Leg weakness, bilateral      Active Problems     Sudden Onset Shortness of Breath (improving) - c/f Volume Overload  Patient presented with sudden onset shortness of breath and weakness evening of 12/20.  Afebrile with heart rate in the 60s and normal blood pressure on arrival, satting within appropriate range on baseline 3 L. V/Q scan with low probability for PE. Also concerned for arrhythmia, orthostatic hypotension, vagal, general volume overloaded state with elevated BNP at 5600, although chest x-ray is not particularly convincing for volume overload and patient has no lower extremity edema, exam notable for JVD but patient has history of pulmonary hypertension making volume exam difficult. No evidence of COPD exacerbation or PNA. Feeling improved following diuresis.  - PO Torsemide  80 mg x 1 on 12/23, will monitor I/O and renal function and spot dose as needed  - Telemetry   - daily CBC, BMP, Mg   - PT/OT      Bilateral leg weakness (improving)  Sudden onset weakness noted in bilateral legs. Given weakness with hip flexion on exam and bilateral ankle myoclonus, considered neurologic etiology including upper motor neuron injury, radiculopathy, lumbar strain, transverse myelitis (though less likely with symptom onset). CT head without acute findings. MRI lumbar spine demonstrated vertebral compression fractures at L2/L3 along with multilevel spondylolisthesis with associated neural foraminal narrowing, though appears relatively stable from prior and likely does not explain acute symptoms. Possibly related to acute urinary infection as below. Has been improving with supportive care and treatment of infection.   - F/up SPEP  - PT/OT     UTI  Patient noted to have UA on admission with large leukocyte esterase and positive nitrate, few bacteria.  Patient afebrile, asymptomatic without dysuria or increased urinary frequency. Urine culture growing Pseudomonas and Klebsiella. Possible that urinary infection is contributing to above symptoms/presentation as workup has otherwise been largely unremarkable.   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  Does not want invasive procedures like PPM, AVN ablation, not interested in a watchman.  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 Cr on admission 2.39.   - daily BMP, Mg         Mentation CAM:     6CIT: 6CIT Total Score: 2 (Dec 17, 2024 1426 : Joesph Steen RAMAN, RN)  Delirium Order Set: Ordered (appropriate for any patient >65)  Dementia: Yes.  Behavioral Symptoms (baseline or now): Yes.   Mobility Baseline functional status: Needs Assistance with ADLs  Current functional status: Needs Assistance with ADLs  PT/OT Ordered: Yes   Medications Reviewed home medications, screening for potentially inappropriate medications: Yes  Medications recommended  de-prescribing: none   What Matters Geri Assessment complete: No, needs to be done       Chronic Problems     COPD: baseline 3L; continue home inhalers. 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    Polyneuropathy: continue gabapentin  prn  OSA: uses trilogy at night; CPAP nightly inpatient   IDA: daily CBC, off eliquis ; continue PO iron  MWF  Chronic Back Pain: continue gabapentin  PRN, flexeril  PRN, oxy PRN     The patient's presentation is complicated by the following clinically significant conditions requiring additional evaluation and treatment: - Disorders of electrolytes, volume status, and acid/base status: - Volume overload requiring further investigation, treatment, or monitoring  - overweight/obese/morbidly obese: overweight POA affecting nursing needs, DME, and attention to weight based medication dosing -- Body mass index is 25.02 kg/m??.   - Hypercoagulable state requiring additional attention to DVT prophylaxis and treatment or chronic anticoagulation secondary to Atrial Fibrillation   - Chronic kidney disease POA requiring further investigation, treatment, or monitoring   - Chronic heart failure POA requiring further investigation, treatment, or monitoring  - Age related debility POA requiring additional resources: DME, PT, or OT  - Hypoxia requiring further investigation, treatment, or monitoring         Issues Impacting Complexity of Management:  -High risk of complications from pain and/or analgesia likely to result in delirium  -The patient is at high risk from Hospital immobility in an elderly patient given baseline poor functional status with a high risk of causing delirium and further decline in function  -Need for the following intensive monitoring parameter(s) due to high risk of clinical decline: telemetry        Medical Decision Making: Assessment required an independent historian, additional information obtained from family/friend, daughter    Daily Checklist:  Diet: Regular Diet  DVT PPx: Heparin  5000units q8h  Electrolytes: Replete Potassium to >/= 3.6 and Magnesium  to >/= 1.8  Code Status: Full Code  Dispo: floor    Team Contact Information:   Primary Team: Geriatrics ([facility])  Primary Resident: Vernell FORBES Pen, MD  Resident's Pager: [phone] (Geriatrics Intern - Tower)    Interval History:   No acute events overnight. Worked with PT/OT this morning and feels her weakness is improving. Feels her breathing is back to baseline. Hoping to return home soon.     Objective:   Temp:  [36.8 ??C (98.2 ??F)-36.9 ??C (98.4 ??F)] 36.8 ??C (98.2 ??F)  Pulse:  [58-77] 58  Resp:  [18] 18  BP: (117-148)/(64-91) 117/91  SpO2:  [95 %-99 %] 98 % on 3L [place]    Gen: in NAD, answers questions appropriately  Eyes: sclera anicteric, EOMI  HENT: atraumatic, MMM   Heart: RRR, S1, S2  Lungs: CTAB, mild bibasilar crackles, no use of accessory muscles  Abdomen: soft, NTND.   Extremities: no clubbing, cyanosis, or edema in the BLEs  Psych: Alert, oriented, appropriate mood and affect    Labs/Studies: Labs and Studies from the last 24hrs per EMR and Reviewed     Vernell Pen, MD  PGY-3, [facility]

## 2024-12-19 NOTE — Consults (Signed)
 PHYSICAL THERAPY  Evaluation (12/19/24 0916)          Patient Name:  Barbara Huber       Medical Record Number: [id number]   Date of Birth: 05-24-1941  Sex: Female        Post-Discharge Physical Therapy Recommendations:  PT Post Acute Discharge Recommendations: Supervision for all mobility due to fall risk, 3x weekly   Discharge transport recommendations : Ambulatory  Equipment Recommendation  PT DME Recommendations: None          Treatment Diagnosis: Abnormalities of gait and mobility, Difficulty in walking, Generalized muscle weakness, Lower extremity weakness        ASSESSMENT  Problem List: Decreased strength, Impaired balance, Decreased mobility, Fall risk, Decreased endurance, At risk for deconditioning      Assessment : 'Pt is an 83 year old female presents to the emergency department with generalized weakness, shortness of breath. 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 Pt has 24/7 caregivers at home, denies falls history. Daughter reports pt was ambulating normally this past saturday and then woke up on Sunday with increased weakness making it difficult to ambulate. Pt presents to acute PT services below baseline level of function, with weakness and fatigue impacting pt's mobility. Pt performed supine>sit with CGA, sit<>stand with Mod/MaxA and rollator, and ambulation with Min/CGA and rollator. Pt will continue to benefit from acute PT services with 3x24/7 post-acute needs to address impairments.      Today's Interventions: PT Eval, bed mobility, transfers, ambulation, edu re: PT role, POC, safety, importance of OOB mobility     Personal Factors/Comorbidities Present: 3+ factors   Examination of Body systems: 4+ elements  Clinical Presentation: Evolving    Eval Complexity : Moderate Complexity     Activity Tolerance: Tolerated treatment well, Limited by fatigue       PLAN  Planned Frequency of Treatment: Plan of Care Initiated: 12/19/24  1-2x per day Weekly Frequency: 4-5 days per week  Planned Treatment Duration: 01/02/25     Planned Interventions: Education (Patient/Family/Caregiver), Self-care / Home Management training, Gait training, Therapeutic Exercise, Therapeutic Activity, Home exercise program     Goals:   Patient and Family Goals: To get stronger and return to baseline     SHORT GOAL #1: Pt will perform supine<>sit with SBA               Time Frame : 2 weeks  SHORT GOAL #2: Pt will perform sit<>stand with LRAD and CGA              Time Frame : 2 weeks  SHORT GOAL #3: Pt will ambulate 100ft with LRAD and supervision              Time Frame : 2 weeks                                Prognosis:  Good  Positive Indicators: CG support, participation  Barriers to Discharge: Endurance deficits, Functional strength deficits     SUBJECTIVE  Communication Preference: Verbal     Patient reports: Agreeable to PT  Pain Comments: Pt denies pain     Services patient receives prior to admission: PT, OT  Prior Functional Status: Pt reports PLOF as Mod-I with rollator for short distances in the home; W/C for longer distances.  Has 24/7 A from family for ADLs.  Uses 3.0L supplemental oxygen   Living Situation  Living Environment: House  Lives With: Daughter, Family  Home Living: One level home, Walk-in shower, Grab bars in shower, Built-in shower seat, Shower chair with back, Ramped entrance, Bedside commode, Handicapped height toilet  Caregiver Identified?: Yes  Caregiver Availability: 24 hours  Caregiver Ability: Limited lifting  Caregiver Identified?: Yes   Equipment available at home: Oxygen , Bedside commode, Conservator, Museum/gallery, Occupational Hygienist        Past Medical History[1]         Social History     Tobacco Use    Smoking status: Former     Current packs/day: 0.00     Average packs/day: 0.3 packs/day     Types: Cigarettes     Quit date: 2019     Years since quitting: 6.9    Smokeless tobacco: Never    Tobacco comments:     Quit a few years ago   Substance Use Topics    Alcohol use: No       Past Surgical History[2]          Family History[3]     Allergies: Nitrofurantoin and Lipitor  [atorvastatin ]                  Objective Findings  Precautions / Restrictions  Precautions: Falls precautions  Required Braces or Orthoses: Non-applicable        Equipment / Environment: Vascular access (PIV, TLC, Port-a-cath, PICC), Purewick/Condom catheter, Supplemental oxygen , Telemetry     Vitals/Orthostatics : VSS, asymptomatic. BP 115/66 HR 69bpm SpO2 97% on 3L     Cognition: WFL  Orientation: Oriented x4  Visual/Perception: Wears Glasses/Contacts all the time  Hearing: No deficit identified     Skin Inspection: Intact where visualized     Upper Extremities  UE ROM: Left Impaired/Limited, Right Impaired/Limited  RUE ROM Impairment: Limited AROM, Pain with movement  LUE ROM Impairment: Limited AROM, Pain with movement  UE comment: Strength NT due to pain with shoulder elevation, unable to elevate shoulders B past ~60deg, grip strength 2/5    Lower Extremities  LE ROM: Right WFL, Left WFL  LE Strength: Right WFL, Left WFL  LE comment: B knee extension 3+/5          Coordination: Not tested  Proprioception: Not tested  Sensation: Impaired  Posture: Kyphotic, Rounded shoulders  Motor/Sensory/Neuro Comments: H/o neuropathy, affects grip strength    Static Sitting-Level of Assistance: Standby assistance  Dynamic Sitting-Level of Assistance: Contact guard, Standby assistance  Sitting Balance comments: Pt required cues to anterior lean due to slowly leaning posteriorly when fatigued in sitting EOB    Static Standing-Level of Assistance: Minimum assistance, Contact guard  Dynamic Standing - Level of Assistance: Minimum assistance, Contact guard  Standing Balance comments: Initial MinA progressing to CGA with rollator      Bed Mobility              Bed Mobility comments: supine>sit CGA with HOB elevated; MaxA to scoot hips forward at EOB with cues for weightshifting    Transfers        Transfer comments: sit<>stand with initial MaxA progressing to ModA with use of rollator and rocking for momentum    Ambulation  Level of Assistance: Contact guard assist, steadying assist  Assistive Device: Four wheel walker   Distance Ambulated (ft): 20 ft   Ambulation comments: Pt ambulated 23ft+15ft with rollator and CGA with slow, mildly unsteady gait and  forward flexed posture with seated rest due to feeling unstable       Stairs:NA- ramped entrance           Endurance: Below baseline    Patient at end of session: All needs in reach, Friends/Family present, In chair, Notified Provider, Lines intact            AM-PAC 5 click  Help currently need turning over In bed?: A Little - Minimal/Contact Guard Assist/Supervision  Help currently needed sitting down/standing up from chair with arms? : A lot - Maximum/Moderate Assistance  Help currently needed moving from supine to sitting on edge of bed?: A Little - Minimal/Contact Guard Assist/Supervision  Help currently needed moving to and from bed from wheelchair?: A Little - Minimal/Contact Guard Assist/Supervision  Help currently needed walking in a hospital room?: A Little - Minimal/Contact Guard Assist/Supervision      Basic Mobility Score 5 click: 14    Score (in points): % of Functional Impairment, Limitation, Restriction  5: 100% impaired, limited, restricted  6-7: At least 80%, but less than 100% impaired, limited restricted  8-11: At least 60%, but less than 80% impaired, limited restricted  12-16: At least 40%, but less than 60% impaired, limited restricted  17-18: At least 20%, but less than 40% impaired, limited restricted  19: At least 1%, but less than 20% impaired, limited restricted  20: 0% impaired, limited restricted    'AM-PAC' forms are Copyright protected by [employer]     Physical Therapy Session Duration  PT Individual [mins]: 31    I attest that I have reviewed the above information.  Signed: Camie Blew, PT  Filed 12/19/2024          [1]   Past Medical History:  Diagnosis Date    Acute exacerbation of CHF (congestive heart failure) (CMS-HCC) 08/19/2023    Acute kidney injury superimposed on chronic kidney disease 10/11/2020    Acute on chronic diastolic (congestive) heart failure (CMS-HCC) 08/23/2020    Acute on chronic diastolic congestive heart failure (CMS-HCC) 08/23/2020    AKI (acute kidney injury) 04/14/2015    Lab Results  Component  Value  Date     CREATININE  1.90 (H)  06/12/2021     Had a bump in her creatinine when she was taking her diuretics every day.  She is currently taking 40 mg daily of torsemide  and 50 mg of spironolactone .  Her volume status is fragile.  Previously when she stopped her diuretic she becomes short of breath.  Plan: We will check her BMP today.  We will likely have to go to 40    Anemia 08/22/2019    Iron  deficiency anemia          Lab Results      Component    Value    Date           WBC    9.2    03/21/2021           RBC    3.67 (L)    03/21/2021           HGB    9.9 (L)    03/21/2021           HCT    30.7 (L)    03/21/2021           MCV    83.7    03/21/2021           MCH  26.9    03/21/2021           MCHC    32.1    03/21/2021           RDW    21.9 (H)    03/21/2021           PLT    318     Arthritis     Calculus of kidney     Calculus of ureter     CHF (congestive heart failure) (CMS-HCC)     Chronic atrial fibrillation    (CMS-HCC) 07/20/2019    COPD (chronic obstructive pulmonary disease) (CMS-HCC)     Diabetes (CMS-HCC)     Gangrenous cholecystitis 10/11/2020    Generalized edema  06/17/2021    GERD (gastroesophageal reflux disease)     Hydronephrosis     Hypertension     Hyponatremia 10/11/2020    Intermediate coronary syndrome (CMS-HCC) 03/13/2014    Lower extremity edema 09/28/2020    Lumbar stenosis     Microscopic hematuria     Nausea alone     Nephrolithiasis 04/17/2016    Neuropathy Nocturia     Nocturia 07/01/2017    Other chronic cystitis     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  See plans for anemia, AKI  We are also tapering her gabapentin .  Currently on 300 mg 3 times daily.  We will decrease it to twice daily, and then nightly.  She states she is not having any recurrence of her pain.    Pulmonary hypertension    (CMS-HCC)     Renal colic     Shortness of breath 04/28/2020    Sleep apnea     Unstable angina pectoris    (CMS-HCC) 03/13/2014   [2]   Past Surgical History:  Procedure Laterality Date    BACK SURGERY  1995    CARPAL TUNNEL RELEASE Left 2014    HYSTERECTOMY  1971    IR INSERT CHOLECYSTOSTOMY TUBE PERCUTANEOUS  10/02/2020    IR INSERT CHOLECYSTOSMY TUBE PERCUTANEOUS 10/02/2020 Ivonne Butler Seed, MD IMG VIR H&V [facility]    LUMBAR DISC SURGERY      PR REMOVAL GALLBLADDER N/A 10/06/2020    Procedure: CHOLECYSTECTOMY;  Surgeon: Curtistine Mira Dunnings, MD;  Location: MAIN OR [facility];  Service: Trauma    PR RIGHT HEART CATH O2 SATURATION & CARDIAC OUTPUT N/A 09/30/2020    Procedure: Right Heart Catheterization;  Surgeon: Reyes Jerilynn Sage, MD;  Location: [facility] CATH;  Service: Cardiology    PR RIGHT HEART CATH O2 SATURATION & CARDIAC OUTPUT N/A 01/09/2021    Procedure: Right Heart Catheterization;  Surgeon: Norleen Deward Grumbling, MD;  Location: [facility] CATH;  Service: Cardiology   [3]   Family History  Problem Relation Age of Onset    Hypertension Mother     Cancer Father         COLON CANCER    No Known Problems Sister     No Known Problems Sister     No Known Problems Brother     No Known Problems Brother     No Known Problems Maternal Aunt     No Known Problems Maternal Uncle     No Known Problems Paternal Aunt     No Known Problems Paternal Uncle     No Known Problems Maternal Grandmother     No Known Problems Maternal Grandfather     No Known Problems Paternal Grandmother  No Known Problems Paternal Grandfather     No Known Problems Other Anesthesia problems Neg Hx     Broken bones Neg Hx     Clotting disorder Neg Hx     Collagen disease Neg Hx     Diabetes Neg Hx     Dislocations Neg Hx     Fibromyalgia Neg Hx     Gout Neg Hx     Hemophilia Neg Hx     Osteoporosis Neg Hx     Rheumatologic disease Neg Hx     Scoliosis Neg Hx     Severe sprains Neg Hx     Sickle cell anemia Neg Hx     Spinal Compression Fracture Neg Hx     GU problems Neg Hx     Kidney cancer Neg Hx     Prostate cancer Neg Hx

## 2024-12-19 NOTE — Plan of Care (Signed)
 Shift Summary  Severe pain in the back and foot was reported and managed with PRN oxyCODONE  and gabapentin , but pain level remained unchanged.   Family visited and received updates, and patient rounds were performed regularly.   Fall reduction strategies, including bed alarms, hourly checks, and scheduled toileting, were maintained throughout the shift.   Moderate assistance was required for bathing and hygiene, with no improvement in independence.   No new hospital-acquired illness, injury, or fall was documented, and the environment remained safe.     Absence of Hospital-Acquired Illness or Injury: No new hospital-acquired illness or injury documented during the shift, and the environment remained safe with alarms activated and bed rails up throughout.     Rounds/Family Conference: Family visited and received updates multiple times during the shift, and patient rounds were consistently performed.     Improved Ability to Complete Activities of Daily Living: Moderate assistance was required for bathing and hygiene, with no change in level of independence noted.     Optimal Pain Control and Function: Pain was described as constant and severe (9/10) in the back and foot, with aching and burning qualities; oxyCODONE  and gabapentin  were administered PRN, but pain status did not change during the shift.     Absence of Fall and Fall-Related Injury: Fall reduction interventions were maintained, including hourly visual checks, bed alarms, and scheduled toileting; no fall or injury was documented.

## 2024-12-19 NOTE — Plan of Care (Signed)
 Shift Summary  PRN pain medications were administered early in the shift, resulting in later denial of pain.    Physical therapy was performed with moderate assistance, and the patient was agreeable to participation.    Incentive spirometry was attempted but could not be performed.    Fall prevention strategies were maintained throughout the shift, with no reported falls.    Overall, the patient required moderate assistance for mobility and hygiene, and respiratory status remained abnormal.     Absence of Hospital-Acquired Illness or Injury: No new skin breakdown was noted during visual inspection, and incontinence pads were utilized for skin protection; temperature remained within normal limits.     Improved Ability to Complete Activities of Daily Living: Moderate assistance was required for mobility and hygiene throughout the shift, with some minimal assistance noted for hygiene tasks.     Optimal Pain Control and Function: Pain was reported as aching and burning with a score of 7 early in the shift, but later the patient denied pain after PRN administration of acetaminophen , oxyCODONE , and gabapentin , and declined further intervention.     Optimal Gas Exchange: Respiratory rate remained elevated and breath sounds were diminished bilaterally, with SpO2 fluctuating between 95% and 97% on supplemental oxygen ; unable to perform incentive spirometry.     Absence of Fall and Fall-Related Injury: Fall reduction interventions were consistently maintained, hourly visual checks and toileting every two hours were performed, and no falls occurred.

## 2024-12-20 LAB — BASIC METABOLIC PANEL
ANION GAP: 17 mmol/L — ABNORMAL HIGH (ref 5–14)
BLOOD UREA NITROGEN: 75 mg/dL — ABNORMAL HIGH (ref 9–23)
BUN / CREAT RATIO: 29
CALCIUM: 9.1 mg/dL (ref 8.7–10.4)
CHLORIDE: 95 mmol/L — ABNORMAL LOW (ref 98–107)
CO2: 26.7 mmol/L (ref 20.0–31.0)
CREATININE: 2.61 mg/dL — ABNORMAL HIGH (ref 0.55–1.02)
EGFR CKD-EPI (2021) FEMALE: 18 mL/min/1.73m2 — ABNORMAL LOW (ref >=60)
GLUCOSE RANDOM: 97 mg/dL (ref 70–179)
POTASSIUM: 3.7 mmol/L (ref 3.4–4.8)
SODIUM: 139 mmol/L (ref 135–145)

## 2024-12-20 LAB — SERUM FREE LIGHT CHAINS
K/L FLC RATIO: 1.15 (ref 0.26–1.65)
KAPPA FREE,SERUM: 11.15 mg/dL — ABNORMAL HIGH (ref 0.33–1.94)
LAMBDA FREE, SER: 9.7 mg/dL — ABNORMAL HIGH (ref 0.57–2.63)

## 2024-12-20 LAB — MAGNESIUM: MAGNESIUM: 2.2 mg/dL (ref 1.6–2.6)

## 2024-12-20 LAB — PHOSPHORUS: PHOSPHORUS: 4.3 mg/dL (ref 2.4–5.1)

## 2024-12-20 MED ADMIN — heparin (porcine) 5,000 unit/mL injection 5,000 Units: 5000 [IU] | SUBCUTANEOUS | @ 19:00:00

## 2024-12-20 MED ADMIN — heparin (porcine) 5,000 unit/mL injection 5,000 Units: 5000 [IU] | SUBCUTANEOUS | @ 11:00:00

## 2024-12-20 MED ADMIN — heparin (porcine) 5,000 unit/mL injection 5,000 Units: 5000 [IU] | SUBCUTANEOUS | @ 03:00:00

## 2024-12-20 MED ADMIN — melatonin tablet 3 mg: 3 mg | ORAL | @ 03:00:00

## 2024-12-20 MED ADMIN — lidocaine (ASPERCREME) 4 % 1 patch: 1 | TRANSDERMAL | @ 14:00:00

## 2024-12-20 MED ADMIN — acetaminophen (TYLENOL) tablet 1,000 mg: 1000 mg | ORAL | @ 13:00:00

## 2024-12-20 MED ADMIN — piperacillin-tazobactam (ZOSYN) IVPB (premix) 2.25 g: 2.25 g | INTRAVENOUS | @ 11:00:00 | Stop: 2024-12-20

## 2024-12-20 MED ADMIN — piperacillin-tazobactam (ZOSYN) IVPB (premix) 2.25 g: 2.25 g | INTRAVENOUS | @ 07:00:00 | Stop: 2024-12-20

## 2024-12-20 MED ADMIN — midodrine (PROAMATINE) tablet 5 mg: 5 mg | ORAL | @ 11:00:00

## 2024-12-20 MED ADMIN — midodrine (PROAMATINE) tablet 5 mg: 5 mg | ORAL | @ 23:00:00

## 2024-12-20 MED ADMIN — midodrine (PROAMATINE) tablet 5 mg: 5 mg | ORAL | @ 16:00:00

## 2024-12-20 MED ADMIN — pantoprazole (Protonix) EC tablet 20 mg: 20 mg | ORAL | @ 14:00:00

## 2024-12-20 MED ADMIN — gabapentin (NEURONTIN) capsule 100 mg: 100 mg | ORAL | @ 15:00:00

## 2024-12-20 MED ADMIN — gabapentin (NEURONTIN) capsule 100 mg: 100 mg | ORAL | @ 03:00:00

## 2024-12-20 MED ADMIN — tafamidis cap 61 mg **PATIENT SUPPLIED**: 61 mg | ORAL | @ 15:00:00

## 2024-12-20 MED ADMIN — fluticasone furoate-vilanterol (BREO ELLIPTA) 100-25 mcg/dose inhaler 1 puff: 1 | RESPIRATORY_TRACT | @ 14:00:00

## 2024-12-20 MED ADMIN — nystatin (MYCOSTATIN) powder: TOPICAL | @ 03:00:00

## 2024-12-20 MED ADMIN — nystatin (MYCOSTATIN) powder: TOPICAL | @ 14:00:00

## 2024-12-20 MED ADMIN — amiodarone (PACERONE) tablet 200 mg: 200 mg | ORAL | @ 14:00:00

## 2024-12-20 MED ADMIN — oxyCODONE (ROXICODONE) immediate release tablet 5 mg: 5 mg | ORAL | @ 03:00:00 | Stop: 2024-12-20

## 2024-12-20 MED ADMIN — oxyCODONE (ROXICODONE) immediate release tablet 5 mg: 5 mg | ORAL | @ 15:00:00 | Stop: 2024-12-20

## 2024-12-20 MED ADMIN — spironolactone (ALDACTONE) split tablet 12.5 mg: 12.5 mg | ORAL | @ 14:00:00

## 2024-12-20 MED ADMIN — ferrous sulfate tablet 325 mg: 325 mg | ORAL | @ 14:00:00

## 2024-12-20 MED ADMIN — umeclidinium (INCRUSE ELLIPTA) 62.5 mcg/actuation inhaler 1 puff: 1 | RESPIRATORY_TRACT | @ 14:00:00

## 2024-12-20 MED ADMIN — polyethylene glycol (MIRALAX) packet 17 g: 17 g | ORAL | @ 14:00:00

## 2024-12-20 MED ADMIN — ciprofloxacin HCl (CIPRO) tablet 500 mg: 500 mg | ORAL | @ 16:00:00 | Stop: 2024-12-20

## 2024-12-20 NOTE — Progress Notes (Signed)
 [name]    Assessment & Plan:   Barbara Huber is a 83 y.o. female whose presentation is complicated by [name] (amyloid), COPD, pHTN, [name]4, A fib that presented to [facility] with acute onset shortness of breath and bl weakness.     Principal Problem:    Shortness of breath  Active Problems:    Leg weakness, bilateral      Active Problems     AKI on [name]  Baseline serum Cr variable, most recently appears 1.8-2.2. Mildly elevated at 2.4 on admission. Initially improved following [name], now rising again. Suspect she has a very tenuous volume status given her underlying [name]/amyloid and pulmonary hypertension and is sensitive to small fluid shifts, may have a pre-renal component after receiving [name] on admission.   - Will hold Torsemide  12/24  - Daily BMP  - Obtain PVR to rule out post-renal component     Sudden Onset Shortness of Breath (improving) - c/f Volume Overload  Patient presented with sudden onset shortness of breath and weakness evening of 12/20.  Afebrile with heart rate in the 60s and normal blood pressure on arrival, satting within appropriate range on baseline 3 L. V/Q scan with low probability for PE. Also concerned for arrhythmia, orthostatic hypotension, vagal, general volume overloaded state with elevated BNP at 5600, although chest x-ray is not particularly convincing for volume overload and patient has no lower extremity edema, exam notable for JVD but patient has history of pulmonary hypertension making volume exam difficult. No evidence of COPD exacerbation or PNA. Feeling improved following diuresis.   - Hold Torsemide  12/24 iso rising Cr  - Telemetry   - daily CBC, BMP, Mg   - PT/OT      Bilateral leg weakness (improving)  Sudden onset weakness noted in bilateral legs. Given weakness with hip flexion on exam and bilateral ankle myoclonus, considered neurologic etiology including upper motor neuron injury, radiculopathy, lumbar strain, transverse myelitis (though less likely with symptom onset). CT head without acute findings. MRI lumbar spine demonstrated vertebral compression fractures at L2/L3 along with multilevel spondylolisthesis with associated neural foraminal narrowing, though appears relatively stable from prior and likely does not explain acute symptoms. Possibly related to acute urinary infection as below. Has been improving with supportive care and treatment of infection.   - F/up SPEP  - PT/OT     UTI  Patient noted to have UA on admission with large leukocyte esterase and positive nitrate, few bacteria.  Patient afebrile, asymptomatic without dysuria or increased urinary frequency. Urine culture growing Pseudomonas and Klebsiella. Possible that urinary infection is contributing to above symptoms/presentation as workup has otherwise been largely unremarkable.   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  Does not want invasive procedures like PPM, AVN ablation, not interested in a watchman.  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 Cr on admission 2.39.   - daily BMP, Mg         Mentation CAM:  6CIT: 6CIT Total Score: 2 (Dec 17, 2024 1426 : Joesph Steen RAMAN, RN)  Delirium Order Set: Ordered (appropriate for any patient >65)  Dementia: Yes.  Behavioral Symptoms (baseline or now): Yes.   Mobility Baseline functional status: Needs Assistance with ADLs  Current functional status: Needs Assistance with ADLs  PT/OT Ordered: Yes   Medications Reviewed home medications, screening for potentially inappropriate medications: Yes  Medications recommended de-prescribing: none   What Matters Geri Assessment complete: No, needs to be done       Chronic Problems     COPD: baseline 3L; continue home inhalers. 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    Polyneuropathy: continue gabapentin  prn  OSA: uses trilogy at night; CPAP nightly inpatient   IDA: daily CBC, off eliquis ; continue PO iron  MWF  Chronic Back Pain: continue gabapentin  PRN, flexeril  PRN, oxy PRN     The patient's presentation is complicated by the following clinically significant conditions requiring additional evaluation and treatment: - Disorders of electrolytes, volume status, and acid/base status: - Volume overload requiring further investigation, treatment, or monitoring  - overweight/obese/morbidly obese: overweight POA affecting nursing needs, DME, and attention to weight based medication dosing -- Body mass index is 25.02 kg/m??.   - Hypercoagulable state requiring additional attention to DVT prophylaxis and treatment or chronic anticoagulation secondary to Atrial Fibrillation   - Chronic kidney disease POA requiring further investigation, treatment, or monitoring   - Chronic heart failure POA requiring further investigation, treatment, or monitoring  - Age related debility POA requiring additional resources: DME, PT, or OT  - Hypoxia requiring further investigation, treatment, or monitoring         Issues Impacting Complexity of Management:  -High risk of complications from pain and/or analgesia likely to result in delirium  -The patient is at high risk from Hospital immobility in an elderly patient given baseline poor functional status with a high risk of causing delirium and further decline in function  -Need for the following intensive monitoring parameter(s) due to high risk of clinical decline: telemetry        Medical Decision Making: Assessment required an independent historian, additional information obtained from family/friend, daughter    Daily Checklist:  Diet: Regular Diet  DVT PPx: Heparin  5000units q8h  Electrolytes: Replete Potassium to >/= 3.6 and Magnesium  to >/= 1.8  Code Status: Full Code  Dispo: floor    Team Contact Information:   Primary Team: Geriatrics (MEDA)  Primary Resident: Vernell FORBES Pen, MD  Resident's Pager: [phone] (Geriatrics Intern - Tower)    Interval History:   No acute events overnight. Feels her breathing is back at baseline and her lower extremity weakness has resolved. Discussed monitoring labs for another day due to her rising creatinine and she was agreeable with this plan.     Objective:   Temp:  [36.2 ??C (97.2 ??F)-37.5 ??C (99.5 ??F)] 36.2 ??C (97.2 ??F)  Pulse:  [62-87] 62  Resp:  [16-23] 16  BP: (93-115)/(49-84) 101/49  SpO2:  [94 %-97 %] 97 % on 3L     Gen: in NAD, answers questions appropriately  Eyes: sclera anicteric, EOMI  HENT: atraumatic, MMM   Heart: RRR, S1, S2  Lungs: CTAB, no use of accessory muscles  Abdomen: soft, NTND  Extremities: no clubbing, cyanosis, or edema in the BLEs  Psych: Alert, oriented, appropriate mood and affect    Labs/Studies: Labs and Studies from the last 24hrs per EMR and  Reviewed     Vernell Pen, MD  PGY-3, [facility] Internal Medicine

## 2024-12-20 NOTE — Discharge Instr - Other Info (Signed)
 Home health has been set up for through the agency listed below. The Home health agency will be contacting you to set up a time for them to come see you in your home within 2 days of your discharge.  If you have not heard from them prior to 12/22/24 or you have any questions about home health, please contact them at the phone number listed below.    Home [facility]  - Admitted Since 12/16/2024       Service Provider Services Address Referrals Phone Referrals Fax Patient Preferred    [facility] Primary: Home Health Care  Secondary: Home Occupational Therapy, Home Physical Therapy [address] [place], [place] [address] [phone] --

## 2024-12-20 NOTE — Plan of Care (Signed)
 Shift Summary  oxyCODONE  and gabapentin  were administered PRN for pain management during the shift.   Family was present and updated on condition.   Bed alarms, fall reduction program, and scheduled toileting were consistently maintained to support safety.   Assistance was required for all ADLs and hygiene tasks throughout the shift.   No new injuries or falls occurred, and safety interventions were consistently applied.     Absence of Hospital-Acquired Illness or Injury: No new hospital-acquired injuries or illnesses were documented during the shift, and safety interventions such as alarms and bed rails were consistently maintained.     Rounds/Family Conference: Family visited during the shift and was updated on condition.     Improved Ability to Complete Activities of Daily Living: Assistance was required for bathing, oral care, and general hygiene throughout the shift, with no change in level of independence noted.     Optimal Pain Control and Function: Pain medications including oxyCODONE  and gabapentin  were administered PRN, and limited movement in the left lower extremity persisted.     Absence of Fall and Fall-Related Injury: No falls or fall-related injuries occurred; fall reduction program, bed alarms, and scheduled toileting were maintained.

## 2024-12-20 NOTE — Plan of Care (Signed)
 Shift Summary  Pain management interventions included acetaminophen , oxyCODONE , and gabapentin , with some improvement in pain scores and patient declining further interventions.   Nystatin  was administered in the morning but refused in the afternoon.   Fall reduction strategies and hourly checks were maintained, and no falls or injuries were documented.   Overall, comfort and safety measures were maintained, and discharge readiness was supported.     Optimal Comfort and Wellbeing: Pain scores decreased slightly from 6 to 5 in the morning, but patient declined further pain interventions; acute pain persisted and oral care required assistance. Gabapentin  and acetaminophen  were administered for pain, and nystatin  was refused in the afternoon.     Optimal Pain Control and Function: Pain scores improved from 6 to 5 after administration of acetaminophen , oxyCODONE , and gabapentin ; patient declined further pain interventions later in the shift.     Absence of Fall and Fall-Related Injury: Fall reduction program and hourly visual checks were consistently maintained, with regular toileting and safe environment documented throughout the shift; no falls or injuries occurred.

## 2024-12-21 LAB — BASIC METABOLIC PANEL
ANION GAP: 15 mmol/L — ABNORMAL HIGH (ref 5–14)
BLOOD UREA NITROGEN: 88 mg/dL — ABNORMAL HIGH (ref 9–23)
BUN / CREAT RATIO: 31
CALCIUM: 9.2 mg/dL (ref 8.7–10.4)
CHLORIDE: 97 mmol/L — ABNORMAL LOW (ref 98–107)
CO2: 27.9 mmol/L (ref 20.0–31.0)
CREATININE: 2.81 mg/dL — ABNORMAL HIGH (ref 0.55–1.02)
EGFR CKD-EPI (2021) FEMALE: 16 mL/min/1.73m2 — ABNORMAL LOW (ref >=60)
GLUCOSE RANDOM: 82 mg/dL (ref 70–179)
POTASSIUM: 4.4 mmol/L (ref 3.5–5.1)
SODIUM: 140 mmol/L (ref 135–145)

## 2024-12-21 LAB — PRO-BNP: PRO-BNP: 6299 pg/mL — ABNORMAL HIGH (ref <=300.0)

## 2024-12-21 LAB — MAGNESIUM: MAGNESIUM: 2.2 mg/dL (ref 1.6–2.6)

## 2024-12-21 LAB — PHOSPHORUS: PHOSPHORUS: 4.8 mg/dL (ref 2.4–5.1)

## 2024-12-21 MED ADMIN — heparin (porcine) 5,000 unit/mL injection 5,000 Units: 5000 [IU] | SUBCUTANEOUS | @ 03:00:00

## 2024-12-21 MED ADMIN — heparin (porcine) 5,000 unit/mL injection 5,000 Units: 5000 [IU] | SUBCUTANEOUS | @ 20:00:00

## 2024-12-21 MED ADMIN — heparin (porcine) 5,000 unit/mL injection 5,000 Units: 5000 [IU] | SUBCUTANEOUS | @ 11:00:00

## 2024-12-21 MED ADMIN — melatonin tablet 3 mg: 3 mg | ORAL | @ 01:00:00

## 2024-12-21 MED ADMIN — ciprofloxacin HCl (CIPRO) tablet 500 mg: 500 mg | ORAL | @ 16:00:00 | Stop: 2024-12-23

## 2024-12-21 MED ADMIN — lidocaine (ASPERCREME) 4 % 1 patch: 1 | TRANSDERMAL | @ 15:00:00

## 2024-12-21 MED ADMIN — acetaminophen (TYLENOL) tablet 1,000 mg: 1000 mg | ORAL | @ 20:00:00

## 2024-12-21 MED ADMIN — acetaminophen (TYLENOL) tablet 1,000 mg: 1000 mg | ORAL | @ 01:00:00

## 2024-12-21 MED ADMIN — acetaminophen (TYLENOL) tablet 1,000 mg: 1000 mg | ORAL | @ 11:00:00

## 2024-12-21 MED ADMIN — midodrine (PROAMATINE) tablet 5 mg: 5 mg | ORAL | @ 23:00:00

## 2024-12-21 MED ADMIN — midodrine (PROAMATINE) tablet 5 mg: 5 mg | ORAL | @ 11:00:00

## 2024-12-21 MED ADMIN — midodrine (PROAMATINE) tablet 5 mg: 5 mg | ORAL | @ 16:00:00

## 2024-12-21 MED ADMIN — oxyCODONE (ROXICODONE) immediate release tablet 5 mg: 5 mg | ORAL | @ 16:00:00 | Stop: 2024-12-24

## 2024-12-21 MED ADMIN — pantoprazole (Protonix) EC tablet 20 mg: 20 mg | ORAL | @ 15:00:00

## 2024-12-21 MED ADMIN — gabapentin (NEURONTIN) capsule 100 mg: 100 mg | ORAL | @ 01:00:00

## 2024-12-21 MED ADMIN — tafamidis cap 61 mg **PATIENT SUPPLIED**: 61 mg | ORAL | @ 15:00:00

## 2024-12-21 MED ADMIN — fluticasone furoate-vilanterol (BREO ELLIPTA) 100-25 mcg/dose inhaler 1 puff: 1 | RESPIRATORY_TRACT | @ 14:00:00

## 2024-12-21 MED ADMIN — nystatin (MYCOSTATIN) powder: TOPICAL | @ 20:00:00

## 2024-12-21 MED ADMIN — nystatin (MYCOSTATIN) powder: TOPICAL | @ 15:00:00

## 2024-12-21 MED ADMIN — nystatin (MYCOSTATIN) powder: TOPICAL | @ 01:00:00

## 2024-12-21 MED ADMIN — amiodarone (PACERONE) tablet 200 mg: 200 mg | ORAL | @ 15:00:00

## 2024-12-21 MED ADMIN — gabapentin (NEURONTIN) capsule 100 mg: 100 mg | ORAL | @ 20:00:00

## 2024-12-21 MED ADMIN — umeclidinium (INCRUSE ELLIPTA) 62.5 mcg/actuation inhaler 1 puff: 1 | RESPIRATORY_TRACT | @ 14:00:00

## 2024-12-21 MED ADMIN — polyethylene glycol (MIRALAX) packet 17 g: 17 g | ORAL | @ 15:00:00

## 2024-12-21 NOTE — Progress Notes (Signed)
 [name]    Assessment & Plan:   Barbara Huber is a 83 y.o. female whose presentation is complicated by [name] (amyloid), COPD, pHTN, [name]4, A fib that presented to [facility] with acute onset shortness of breath and bl weakness.     Principal Problem:    Shortness of breath  Active Problems:    Leg weakness, bilateral      Active Problems     AKI on [name]  Baseline serum Cr variable, most recently appears 1.8-2.2. Mildly elevated at 2.4 on admission. Initially improved following [name], now rising again. Suspect she has a very tenuous volume status given her underlying [name]/amyloid and pulmonary hypertension and is sensitive to small fluid shifts, may have a pre-renal component after receiving [name] on admission. Urinary tract infection may be contributing as well. No evidence of urinary retention on PVR scan.   - Continue to hold Torsemide   - Hold Spironolactone   - Repeat pro-BNP  - Consider Nephrology consult if continuing to rise despite adjustments to diuretic regimen  - Daily BMP    Sudden Onset Shortness of Breath (improved) - c/f Volume Overload (resolved)  Patient presented with sudden onset shortness of breath and weakness evening of 12/20.  Afebrile with heart rate in the 60s and normal blood pressure on arrival, satting within appropriate range on baseline 3 L. V/Q scan with low probability for PE. Also concerned for arrhythmia, orthostatic hypotension, vagal, general volume overloaded state with elevated BNP at 5600, although chest x-ray is not particularly convincing for volume overload and patient has no lower extremity edema, exam notable for JVD but patient has history of pulmonary hypertension making volume exam difficult. No evidence of COPD exacerbation or PNA. Feeling improved following diuresis.   - Hold Torsemide  12/25 iso rising Cr  - Telemetry   - daily CBC, BMP, Mg   - PT/OT      Bilateral leg weakness (improved)  Sudden onset weakness noted in bilateral legs. Given weakness with hip flexion on exam and bilateral ankle myoclonus, considered neurologic etiology including upper motor neuron injury, radiculopathy, lumbar strain, transverse myelitis (though less likely with symptom onset). CT head without acute findings. MRI lumbar spine demonstrated vertebral compression fractures at L2/L3 along with multilevel spondylolisthesis with associated neural foraminal narrowing, though appears relatively stable from prior and likely does not explain acute symptoms. Possibly related to acute urinary infection as below. Has been improving with supportive care and treatment of infection.   - F/up SPEP  - PT/OT     UTI  Patient noted to have UA on admission with large leukocyte esterase and positive nitrate, few bacteria.  Patient afebrile, asymptomatic without dysuria or increased urinary frequency. Urine culture growing Pseudomonas and Klebsiella. Possible that urinary infection is contributing to above symptoms/presentation as workup has otherwise been largely unremarkable.   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  Does not want invasive procedures like PPM, AVN ablation, not interested in a watchman.  - continue amiodarone       Mentation CAM:     6CIT: 6CIT Total Score: 2 (Dec 17, 2024 1426 : Joesph Steen RAMAN, RN)  Delirium Order Set: Ordered (appropriate for any patient >65)  Dementia: Yes.  Behavioral Symptoms (baseline or now): Yes.   Mobility Baseline functional status: Needs Assistance with ADLs  Current functional status: Needs Assistance with ADLs  PT/OT Ordered: Yes   Medications Reviewed home medications, screening for potentially inappropriate medications: Yes  Medications recommended de-prescribing: none   What Matters Geri Assessment complete: No, needs to be done       Chronic Problems     COPD: baseline 3L; continue home inhalers. 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    Polyneuropathy: continue gabapentin  prn  OSA: uses trilogy at night; CPAP nightly inpatient   IDA: daily CBC, off eliquis ; continue PO iron  MWF  Chronic Back Pain: continue gabapentin  PRN, flexeril  PRN, oxy PRN     The patient's presentation is complicated by the following clinically significant conditions requiring additional evaluation and treatment: - Disorders of electrolytes, volume status, and acid/base status: - Volume overload requiring further investigation, treatment, or monitoring  - overweight/obese/morbidly obese: overweight POA affecting nursing needs, DME, and attention to weight based medication dosing -- Body mass index is 25.02 kg/m??.   - Hypercoagulable state requiring additional attention to DVT prophylaxis and treatment or chronic anticoagulation secondary to Atrial Fibrillation   - Chronic kidney disease POA requiring further investigation, treatment, or monitoring   - Chronic heart failure POA requiring further investigation, treatment, or monitoring  - Age related debility POA requiring additional resources: DME, PT, or OT  - Hypoxia requiring further investigation, treatment, or monitoring         Issues Impacting Complexity of Management:  -High risk of complications from pain and/or analgesia likely to result in delirium  -The patient is at high risk from Hospital immobility in an elderly patient given baseline poor functional status with a high risk of causing delirium and further decline in function  -Need for the following intensive monitoring parameter(s) due to high risk of clinical decline: telemetry        Medical Decision Making: Assessment required an independent historian, additional information obtained from family/friend, daughter    Daily Checklist:  Diet: Regular Diet  DVT PPx: Heparin  5000units q8h  Electrolytes: Replete Potassium to >/= 3.6 and Magnesium  to >/= 1.8  Code Status: Full Code  Dispo: floor    Team Contact Information:   Primary Team: Geriatrics ([facility])  Primary Resident: Vernell FORBES Pen, MD  Resident's Pager: [phone] (Geriatrics Intern - Tower)    Interval History:   No acute events overnight. Feeling well this morning. Feels her breathing/respiratory status remains at baseline. Discussed rising creatinine and plan to continue monitoring off of diuresis with patient and daughter which they were agreeable with.     Objective:   Temp:  [36.3 ??C (97.3 ??F)-36.9 ??C (98.4 ??F)] 36.9 ??C (98.4 ??F)  Pulse:  [57-64] 57  Resp:  [16-17] 17  BP: (106-116)/(59-70) 106/59  SpO2:  [97 %-100 %] 100 % on 3L [place]    Gen: in NAD, answers questions appropriately  Eyes: sclera anicteric, EOMI  HENT: atraumatic, MMM   Heart: RRR, S1, S2  Lungs: CTAB, no use of accessory muscles  Abdomen: soft, NTND  Extremities: no clubbing, cyanosis, or edema in the BLEs  Psych: Alert, oriented, appropriate mood and affect    Labs/Studies:  Labs and Studies from the last 24hrs per EMR and Reviewed     Vernell Pen, MD  PGY-3, [facility] Internal Medicine

## 2024-12-21 NOTE — Plan of Care (Signed)
 Shift Summary  Pain score decreased from 6 to 3 after administration of pain medications.   Safety interventions and fall prevention strategies were maintained throughout the shift.   Laboratory results indicated elevated BUN, creatinine, and Pro-BNP.   Moderate assistance was required for mobility and ADLs, with some improvement in pain control.   Overall, no new hospital-acquired injuries or falls were documented during the shift.     Absence of Hospital-Acquired Illness or Injury: No new hospital-acquired injuries documented; safety interventions such as fall reduction program, low bed, and nonskid footwear were maintained throughout the shift. Laboratory results show elevated BUN and creatinine, and high Pro-BNP, but no new acute events were recorded.     Improved Ability to Complete Activities of Daily Living: Moderate assistance was required for mobility and bathing, with occasional walking and slightly limited mobility noted; oral care required assistance after initially being independent.     Optimal Pain Control and Function: Pain in the right side described as aching and constant, with pain score decreasing from 6 to 3 after administration of oxyCODONE  and acetaminophen ; patient declined further intervention at midday.     Absence of Fall and Fall-Related Injury: No falls occurred during the shift; hourly visual checks, use of safety devices for transfers, and regular toileting were consistently performed.

## 2024-12-22 LAB — SERUM PROTEIN ELECTROPHORESIS AND IMMUNOFIXATION
ALBUMIN (SPE): 2.7 g/dL — ABNORMAL LOW (ref 3.5–5.0)
ALPHA-1 GLOBULIN: 0.4 g/dL (ref 0.2–0.5)
ALPHA-2 GLOBULIN: 0.8 g/dL (ref 0.5–1.1)
BETA-1 GLOBULIN: 0.4 g/dL (ref 0.3–0.6)
BETA-2 GLOBULIN: 0.6 g/dL (ref 0.2–0.6)
GAMMAGLOBULIN: 1.6 g/dL — ABNORMAL HIGH (ref 0.5–1.5)
PROTEIN TOTAL (SPECIAL CHEM): 6.4 g/dL

## 2024-12-22 LAB — BASIC METABOLIC PANEL
ANION GAP: 17 mmol/L — ABNORMAL HIGH (ref 5–14)
BLOOD UREA NITROGEN: 84 mg/dL — ABNORMAL HIGH (ref 9–23)
BUN / CREAT RATIO: 32
CALCIUM: 9.1 mg/dL (ref 8.7–10.4)
CHLORIDE: 98 mmol/L (ref 98–107)
CO2: 25.5 mmol/L (ref 20.0–31.0)
CREATININE: 2.61 mg/dL — ABNORMAL HIGH (ref 0.55–1.02)
EGFR CKD-EPI (2021) FEMALE: 18 mL/min/1.73m2 — ABNORMAL LOW (ref >=60)
GLUCOSE RANDOM: 84 mg/dL (ref 70–179)
POTASSIUM: 4.1 mmol/L (ref 3.5–5.1)
SODIUM: 140 mmol/L (ref 135–145)

## 2024-12-22 LAB — VITAMIN D 25 HYDROXY: VITAMIN D, TOTAL (25OH): 31.2 ng/mL (ref 20.0–80.0)

## 2024-12-22 LAB — MAGNESIUM: MAGNESIUM: 2.4 mg/dL (ref 1.6–2.6)

## 2024-12-22 LAB — PHOSPHORUS: PHOSPHORUS: 4.4 mg/dL (ref 2.4–5.1)

## 2024-12-22 MED ADMIN — heparin (porcine) 5,000 unit/mL injection 5,000 Units: 5000 [IU] | SUBCUTANEOUS | @ 02:00:00

## 2024-12-22 MED ADMIN — heparin (porcine) 5,000 unit/mL injection 5,000 Units: 5000 [IU] | SUBCUTANEOUS | @ 20:00:00

## 2024-12-22 MED ADMIN — melatonin tablet 3 mg: 3 mg | ORAL | @ 02:00:00

## 2024-12-22 MED ADMIN — ciprofloxacin HCl (CIPRO) tablet 500 mg: 500 mg | ORAL | @ 17:00:00 | Stop: 2024-12-22

## 2024-12-22 MED ADMIN — lidocaine (ASPERCREME) 4 % 1 patch: 1 | TRANSDERMAL | @ 05:00:00

## 2024-12-22 MED ADMIN — lidocaine (ASPERCREME) 4 % 1 patch: 1 | TRANSDERMAL | @ 14:00:00

## 2024-12-22 MED ADMIN — acetaminophen (TYLENOL) tablet 1,000 mg: 1000 mg | ORAL | @ 05:00:00

## 2024-12-22 MED ADMIN — midodrine (PROAMATINE) tablet 5 mg: 5 mg | ORAL | @ 22:00:00

## 2024-12-22 MED ADMIN — midodrine (PROAMATINE) tablet 5 mg: 5 mg | ORAL | @ 11:00:00

## 2024-12-22 MED ADMIN — midodrine (PROAMATINE) tablet 5 mg: 5 mg | ORAL | @ 17:00:00

## 2024-12-22 MED ADMIN — oxyCODONE (ROXICODONE) immediate release tablet 5 mg: 5 mg | ORAL | @ 02:00:00 | Stop: 2024-12-24

## 2024-12-22 MED ADMIN — oxyCODONE (ROXICODONE) immediate release tablet 5 mg: 5 mg | ORAL | @ 23:00:00 | Stop: 2024-12-24

## 2024-12-22 MED ADMIN — oxyCODONE (ROXICODONE) immediate release tablet 5 mg: 5 mg | ORAL | @ 14:00:00 | Stop: 2024-12-24

## 2024-12-22 MED ADMIN — pantoprazole (Protonix) EC tablet 20 mg: 20 mg | ORAL | @ 14:00:00

## 2024-12-22 MED ADMIN — spironolactone (ALDACTONE) split tablet 12.5 mg: 12.5 mg | ORAL | @ 17:00:00

## 2024-12-22 MED ADMIN — tafamidis cap 61 mg **PATIENT SUPPLIED**: 61 mg | ORAL | @ 16:00:00

## 2024-12-22 MED ADMIN — fluticasone furoate-vilanterol (BREO ELLIPTA) 100-25 mcg/dose inhaler 1 puff: 1 | RESPIRATORY_TRACT | @ 13:00:00

## 2024-12-22 MED ADMIN — nystatin (MYCOSTATIN) powder: TOPICAL | @ 03:00:00

## 2024-12-22 MED ADMIN — nystatin (MYCOSTATIN) powder: TOPICAL | @ 20:00:00

## 2024-12-22 MED ADMIN — nystatin (MYCOSTATIN) powder: TOPICAL | @ 14:00:00

## 2024-12-22 MED ADMIN — amiodarone (PACERONE) tablet 200 mg: 200 mg | ORAL | @ 14:00:00

## 2024-12-22 MED ADMIN — gabapentin (NEURONTIN) capsule 100 mg: 100 mg | ORAL | @ 14:00:00

## 2024-12-22 MED ADMIN — cyclobenzaprine (FLEXERIL) tablet 10 mg: 10 mg | ORAL | @ 14:00:00

## 2024-12-22 MED ADMIN — fosfomycin (MONUROL) packet 3 g: 3 g | ORAL | @ 14:00:00 | Stop: 2025-01-26

## 2024-12-22 MED ADMIN — ferrous sulfate tablet 325 mg: 325 mg | ORAL | @ 14:00:00

## 2024-12-22 MED ADMIN — umeclidinium (INCRUSE ELLIPTA) 62.5 mcg/actuation inhaler 1 puff: 1 | RESPIRATORY_TRACT | @ 13:00:00

## 2024-12-22 MED ADMIN — polyethylene glycol (MIRALAX) packet 17 g: 17 g | ORAL | @ 14:00:00

## 2024-12-22 MED ADMIN — gabapentin (NEURONTIN) capsule 200 mg: 200 mg | ORAL | @ 02:00:00

## 2024-12-22 NOTE — Plan of Care (Signed)
 Shift Summary  Pain increased to 9 by late afternoon despite PRN oxyCODONE , with pain quality shifting to neuropathic.   Multiple comfort and safety interventions were implemented, including frequent repositioning, use of pillows, and incontinence pads.   Fall prevention strategies were consistently applied, including bed alarms, side rails, and frequent toileting.   Assistance was required for hygiene and bathing throughout the shift.   No new hospital-acquired injuries or falls occurred during the shift.     Absence of Hospital-Acquired Illness or Injury: No new hospital-acquired injuries or illnesses were documented during the shift; perineal hygiene was encouraged and adhesive use was limited for skin protection.     Optimal Comfort and Wellbeing: Comfort interventions included frequent repositioning, use of pillows, and incontinence pads; pain was addressed with PRN medications and non-pharmacological interventions.     Improved Ability to Complete Activities of Daily Living: Minimal assistance was required for general hygiene and bathing, and oral care needs persisted throughout the shift.     Optimal Pain Control and Function: Pain scores fluctuated, with a decrease to 0 after morning interventions but an increase to 9 by late afternoon despite PRN oxyCODONE  administration; pain descriptors changed from aching to tingling and burning, and pain type shifted from chronic to neuropathic.     Absence of Fall and Fall-Related Injury: Fall prevention strategies were consistently maintained, including bed alarms, side rails, frequent toileting, and use of safety devices for transfers, with no falls reported.

## 2024-12-22 NOTE — Progress Notes (Signed)
 [name]    Assessment & Plan:   Barbara Huber is a 83 y.o. female whose presentation is complicated by [name] (amyloid), COPD, pHTN, [name]4, A fib that presented to [facility] with acute onset shortness of breath and bl weakness.     Principal Problem:    Shortness of breath  Active Problems:    Leg weakness, bilateral      Active Problems  AKI on [name]  Baseline serum Cr variable, most recently appears 1.8-2.2. Mildly elevated at 2.4 on admission. Initially improved following [name], then intervally worsened. Stabilized today.  Suspect she has a very tenuous volume status given her underlying [name]/amyloid and pulmonary hypertension and is sensitive to small fluid shifts, may have a pre-renal component after receiving [name] on admission. Repeat proBNP mildly elevated. Will restart spironolactone  today with plan to restart torsemide  tomorrow if Cr is stable.   - Continue to hold Torsemide   - Restart Spironolactone   - Consider Nephrology consult if continuing to rise despite adjustments to diuretic regimen  - Daily BMP    Sudden Onset Shortness of Breath (improved) - c/f Volume Overload (resolved)  Patient presented with sudden onset shortness of breath and weakness evening of 12/20.  Afebrile with heart rate in the 60s and normal blood pressure on arrival, satting within appropriate range on baseline 3 L. V/Q scan with low probability for PE. Also concerned for arrhythmia, orthostatic hypotension, vagal, general volume overloaded state with elevated BNP at 5600, although chest x-ray is not particularly convincing for volume overload and patient has no lower extremity edema, exam notable for JVD but patient has history of pulmonary hypertension making volume exam difficult. Home scale brought to hospital, weight 160, daughter believes dry weight 166. Will restart spiro today.   - Hold Torsemide  12/25 iso rising Cr  - Telemetry   - daily CBC, BMP, Mg   - PT/OT      Bilateral leg weakness (improved)  Sudden onset weakness noted in bilateral legs. Given weakness with hip flexion on exam and bilateral ankle myoclonus, considered neurologic etiology including upper motor neuron injury, radiculopathy, lumbar strain, transverse myelitis (though less likely with symptom onset). CT head without acute findings. MRI lumbar spine demonstrated vertebral compression fractures at L2/L3 along with multilevel spondylolisthesis with associated neural foraminal narrowing, though appears relatively stable from prior and likely does not explain acute symptoms. Possibly related to acute urinary infection as below. Has been improving with supportive care and treatment of infection.   - F/up SPEP  - PT/OT     UTI  Patient noted to have UA on admission with large leukocyte esterase and positive nitrate, few bacteria.  Patient afebrile, asymptomatic without dysuria or increased urinary frequency. Urine culture growing Pseudomonas and Klebsiella. Possible that urinary infection is contributing to above symptoms/presentation as workup has otherwise been largely unremarkable.   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  Does not want invasive procedures like PPM, AVN ablation, not interested in a watchman.  - continue amiodarone       Mentation CAM:     6CIT: 6CIT Total Score: 2 (Dec 17, 2024 1426 : Joesph Steen RAMAN, RN)  Delirium Order Set: Ordered (appropriate for any patient >65)  Dementia: Yes.  Behavioral Symptoms (baseline or now): Yes.   Mobility Baseline functional status: Needs Assistance with ADLs  Current functional status: Needs Assistance with ADLs  PT/OT Ordered: Yes   Medications Reviewed home medications, screening for potentially inappropriate medications: Yes  Medications recommended de-prescribing: none   What Matters Geri Assessment complete: No, needs to be done       Chronic Problems     COPD: baseline 3L; continue home inhalers. 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    Polyneuropathy: continue gabapentin  prn  OSA: uses trilogy at night; CPAP nightly inpatient   IDA: daily CBC, off eliquis ; continue PO iron  MWF  Chronic Back Pain: continue gabapentin  PRN, flexeril  PRN, oxy PRN     The patient's presentation is complicated by the following clinically significant conditions requiring additional evaluation and treatment: - Disorders of electrolytes, volume status, and acid/base status: - Volume overload requiring further investigation, treatment, or monitoring  - overweight/obese/morbidly obese: overweight POA affecting nursing needs, DME, and attention to weight based medication dosing -- Body mass index is 25.02 kg/m??.   - Hypercoagulable state requiring additional attention to DVT prophylaxis and treatment or chronic anticoagulation secondary to Atrial Fibrillation   - Chronic kidney disease POA requiring further investigation, treatment, or monitoring   - Chronic heart failure POA requiring further investigation, treatment, or monitoring  - Age related debility POA requiring additional resources: DME, PT, or OT  - Hypoxia requiring further investigation, treatment, or monitoring         Issues Impacting Complexity of Management:  -High risk of complications from pain and/or analgesia likely to result in delirium  -The patient is at high risk from Hospital immobility in an elderly patient given baseline poor functional status with a high risk of causing delirium and further decline in function  -Need for the following intensive monitoring parameter(s) due to high risk of clinical decline: telemetry        Medical Decision Making: Assessment required an independent historian, additional information obtained from family/friend, daughter    Daily Checklist:  Diet: Regular Diet  DVT PPx: Heparin  5000units q8h  Electrolytes: Replete Potassium to >/= 3.6 and Magnesium  to >/= 1.8  Code Status: Full Code  Dispo: floor    Team Contact Information:   Primary Team: Geriatrics ([facility])  Primary Resident: Porter Carrel, MD  Resident's Pager: [phone] (Geriatrics Intern - Tower)    Interval History:   No acute events overnight. Continues to feel well this morning - daughter brought in scale from home, 2lbs up from dry weight, however feeling overall eu to hypovolemic. Hoping to go home as soon as she can.     Objective:   Temp:  [36.4 ??C (97.5 ??F)-36.6 ??C (97.9 ??F)] 36.6 ??C (97.9 ??F)  Pulse:  [57-61] 58  Resp:  [16] 16  BP: (99-126)/(54-63) 99/54  SpO2:  [97 %-100 %] 100 % on 3L [place]    Gen: in NAD, answers questions appropriately  Eyes: sclera anicteric, EOMI  HENT: atraumatic, MMM   Heart: RRR, S1, S2  Lungs: CTAB, no use of accessory muscles  Abdomen: soft, NTND  Extremities: no clubbing, cyanosis, or edema in the BLEs  Psych: Alert, oriented, appropriate mood and affect  Labs/Studies: Labs and Studies from the last 24hrs per EMR and Reviewed     Porter Carrel, MD  Internal Medicine, PGY-2

## 2024-12-22 NOTE — Plan of Care (Signed)
 Shift Summary  Multiple pain medications were administered during the shift, including gabapentin , oxyCODONE , and acetaminophen , with ongoing pain assessment and management.  Safety interventions and fall risk protocols were consistently maintained, including bed alarms, side rails, and regular toileting every 2 hours.  The patient's environment remained safe throughout the shift, with no alarms present or incidents reported.  Incontinence pads were utilized for skin protection, and positioning devices were in use to support comfort and safety.  Overall, the shift was marked by active pain management and diligent safety measures.    Absence of Hospital-Acquired Illness or Injury: Safety interventions such as bed alarms, fall reduction program, and environmental modifications were consistently maintained throughout the shift, and the environment remained safe with no alarms present or incidents reported. Side rails were kept at 3/4, incontinence pads were utilized, and regular toileting every 2 hours was performed to reduce fall risk.

## 2024-12-23 LAB — PHOSPHORUS: PHOSPHORUS: 3.6 mg/dL (ref 2.4–5.1)

## 2024-12-23 LAB — BASIC METABOLIC PANEL
ANION GAP: 15 mmol/L — ABNORMAL HIGH (ref 5–14)
BLOOD UREA NITROGEN: 80 mg/dL — ABNORMAL HIGH (ref 9–23)
BUN / CREAT RATIO: 32
CALCIUM: 8.8 mg/dL (ref 8.7–10.4)
CHLORIDE: 100 mmol/L (ref 98–107)
CO2: 26.4 mmol/L (ref 20.0–31.0)
CREATININE: 2.49 mg/dL — ABNORMAL HIGH (ref 0.55–1.02)
EGFR CKD-EPI (2021) FEMALE: 19 mL/min/1.73m2 — ABNORMAL LOW (ref >=60)
GLUCOSE RANDOM: 101 mg/dL (ref 70–179)
POTASSIUM: 4 mmol/L (ref 3.5–5.1)
SODIUM: 141 mmol/L (ref 135–145)

## 2024-12-23 LAB — T3, FREE: T3 FREE: 1.85 pg/mL — ABNORMAL LOW (ref 2.30–4.20)

## 2024-12-23 LAB — T4, FREE: FREE T4: 1.3 ng/dL (ref 0.89–1.76)

## 2024-12-23 LAB — MAGNESIUM: MAGNESIUM: 2.4 mg/dL (ref 1.6–2.6)

## 2024-12-23 MED ORDER — TORSEMIDE 20 MG TABLET
ORAL_TABLET | ORAL | 2 refills | 30.00000 days
Start: 2024-12-23 — End: 2025-03-23

## 2024-12-23 MED ADMIN — heparin (porcine) 5,000 unit/mL injection 5,000 Units: 5000 [IU] | SUBCUTANEOUS | @ 11:00:00 | Stop: 2024-12-23

## 2024-12-23 MED ADMIN — heparin (porcine) 5,000 unit/mL injection 5,000 Units: 5000 [IU] | SUBCUTANEOUS | @ 03:00:00

## 2024-12-23 MED ADMIN — melatonin tablet 3 mg: 3 mg | ORAL | @ 02:00:00

## 2024-12-23 MED ADMIN — lidocaine (ASPERCREME) 4 % 1 patch: 1 | TRANSDERMAL | @ 15:00:00 | Stop: 2024-12-23

## 2024-12-23 MED ADMIN — torsemide (DEMADEX) tablet 100 mg: 100 mg | ORAL | @ 16:00:00 | Stop: 2024-12-23

## 2024-12-23 MED ADMIN — acetaminophen (TYLENOL) tablet 1,000 mg: 1000 mg | ORAL | @ 11:00:00 | Stop: 2024-12-23

## 2024-12-23 MED ADMIN — acetaminophen (TYLENOL) tablet 1,000 mg: 1000 mg | ORAL | @ 02:00:00

## 2024-12-23 MED ADMIN — midodrine (PROAMATINE) tablet 5 mg: 5 mg | ORAL | @ 16:00:00 | Stop: 2024-12-23

## 2024-12-23 MED ADMIN — midodrine (PROAMATINE) tablet 5 mg: 5 mg | ORAL | @ 11:00:00 | Stop: 2024-12-23

## 2024-12-23 MED ADMIN — oxyCODONE (ROXICODONE) immediate release tablet 5 mg: 5 mg | ORAL | @ 18:00:00 | Stop: 2024-12-23

## 2024-12-23 MED ADMIN — oxyCODONE (ROXICODONE) immediate release tablet 5 mg: 5 mg | ORAL | @ 11:00:00 | Stop: 2024-12-23

## 2024-12-23 MED ADMIN — pantoprazole (Protonix) EC tablet 20 mg: 20 mg | ORAL | @ 15:00:00 | Stop: 2024-12-23

## 2024-12-23 MED ADMIN — spironolactone (ALDACTONE) split tablet 12.5 mg: 12.5 mg | ORAL | @ 15:00:00 | Stop: 2024-12-23

## 2024-12-23 MED ADMIN — tafamidis cap 61 mg **PATIENT SUPPLIED**: 61 mg | ORAL | @ 15:00:00 | Stop: 2024-12-23

## 2024-12-23 MED ADMIN — fluticasone furoate-vilanterol (BREO ELLIPTA) 100-25 mcg/dose inhaler 1 puff: 1 | RESPIRATORY_TRACT | @ 13:00:00 | Stop: 2024-12-23

## 2024-12-23 MED ADMIN — nystatin (MYCOSTATIN) powder: TOPICAL | @ 14:00:00 | Stop: 2024-12-23

## 2024-12-23 MED ADMIN — amiodarone (PACERONE) tablet 200 mg: 200 mg | ORAL | @ 15:00:00 | Stop: 2024-12-23

## 2024-12-23 MED ADMIN — gabapentin (NEURONTIN) capsule 100 mg: 100 mg | ORAL | @ 15:00:00 | Stop: 2024-12-23

## 2024-12-23 MED ADMIN — umeclidinium (INCRUSE ELLIPTA) 62.5 mcg/actuation inhaler 1 puff: 1 | RESPIRATORY_TRACT | @ 13:00:00 | Stop: 2024-12-23

## 2024-12-23 MED ADMIN — polyethylene glycol (MIRALAX) packet 17 g: 17 g | ORAL | @ 15:00:00 | Stop: 2024-12-23

## 2024-12-23 MED ADMIN — gabapentin (NEURONTIN) capsule 200 mg: 200 mg | ORAL | @ 02:00:00

## 2024-12-23 MED ADMIN — torsemide (DEMADEX) tablet 40 mg: 40 mg | ORAL | @ 20:00:00 | Stop: 2024-12-23

## 2024-12-23 NOTE — Plan of Care (Signed)
 Shift Summary  Acetaminophen  and melatonin were administered PRN to support comfort and sleep.   Pain was assessed as 0 and patient was observed sleeping.   Positioning was changed several times and safety interventions were maintained, including use of absorbent pads and side rails.   Family was present and updated throughout the shift.   Patient maintained comfort and safety with no documented adverse reactions to interventions.     Absence of Hospital-Acquired Illness or Injury: Positioning was changed multiple times throughout the shift, absorbent pads and incontinence wipes were used, and fall reduction interventions were maintained; no adverse reactions to incentive spirometry were documented. Safety devices were consistently used for transfers and side rails remained up, supporting a safe environment.     Optimal Comfort and Wellbeing: Pain was assessed as 0 and acetaminophen  was administered PRN; melatonin was given for sleep and patient was observed sleeping comfortably.     Rounds/Family Conference: Family was updated and present throughout the shift.

## 2024-12-23 NOTE — Plan of Care (Signed)
 Patient was discharged home per MD order. PIV removed, catheter intact.  Discharge instructions explained, patient verbalized understanding.  Patient was transported to lobby via wheelchair with all personal belongings.    Problem: Adult Inpatient Plan of Care  Goal: Absence of Hospital-Acquired Illness or Injury  Outcome: Discharged to Home  Intervention: Identify and Manage Fall Risk  Recent Flowsheet Documentation  Taken 12/23/2024 1400 by Synthia Elda SAUNDERS, RN  Safety Interventions:   fall reduction program maintained   low bed   nonskid shoes/slippers when out of bed  Taken 12/23/2024 1200 by Jarrod Mcenery R, RN  Safety Interventions:   fall reduction program maintained   low bed   nonskid shoes/slippers when out of bed  Taken 12/23/2024 1000 by Chantille Navarrete R, RN  Safety Interventions:   fall reduction program maintained   low bed   nonskid shoes/slippers when out of bed  Taken 12/23/2024 0800 by Kyleeann Cremeans R, RN  Safety Interventions:   fall reduction program maintained   low bed   nonskid shoes/slippers when out of bed  Intervention: Prevent Skin Injury  Recent Flowsheet Documentation  Taken 12/23/2024 1400 by Synthia Elda SAUNDERS, RN  Positioning for Skin: Sitting in Chair  Taken 12/23/2024 1200 by Synthia Elda SAUNDERS, RN  Positioning for Skin: Sitting in Chair  Taken 12/23/2024 1000 by Synthia Elda SAUNDERS, RN  Positioning for Skin: Sitting in Chair  Taken 12/23/2024 0800 by Synthia Elda SAUNDERS, RN  Positioning for Skin: Supine/Back  Skin Protection: incontinence pads utilized  Goal: Optimal Comfort and Wellbeing  Outcome: Discharged to Home  Goal: Readiness for Transition of Care  Outcome: Discharged to Home  Goal: Rounds/Family Conference  Outcome: Discharged to Home     Problem: Self-Care Deficit  Goal: Improved Ability to Complete Activities of Daily Living  Outcome: Discharged to Home     Problem: Pain Acute  Goal: Optimal Pain Control and Function  Outcome: Discharged to Home     Problem: Gas Exchange Impaired  Goal: Optimal Gas Exchange  Outcome: Discharged to Home  Intervention: Optimize Oxygenation and Ventilation  Recent Flowsheet Documentation  Taken 12/23/2024 0800 by Synthia Elda SAUNDERS, RN  Head of Bed [facility]) Positioning: [facility] elevated     Problem: Fall Injury Risk  Goal: Absence of Fall and Fall-Related Injury  Outcome: Discharged to Home  Intervention: Promote Injury-Free Environment  Recent Flowsheet Documentation  Taken 12/23/2024 1400 by Synthia Elda SAUNDERS, RN  Safety Interventions:   fall reduction program maintained   low bed   nonskid shoes/slippers when out of bed  Taken 12/23/2024 1200 by Shelma Eiben R, RN  Safety Interventions:   fall reduction program maintained   low bed   nonskid shoes/slippers when out of bed  Taken 12/23/2024 1000 by Takeila Thayne R, RN  Safety Interventions:   fall reduction program maintained   low bed   nonskid shoes/slippers when out of bed  Taken 12/23/2024 0800 by Danaria Larsen R, RN  Safety Interventions:   fall reduction program maintained   low bed   nonskid shoes/slippers when out of bed     Problem: Skin Injury Risk Increased  Goal: Skin Health and Integrity  Outcome: Discharged to Home  Intervention: Optimize Skin Protection  Recent Flowsheet Documentation  Taken 12/23/2024 1400 by Synthia Elda SAUNDERS, RN  Activity Management: up in chair  Pressure Reduction Techniques: frequent weight shift encouraged  Taken 12/23/2024 1200 by Synthia Elda SAUNDERS, RN  Activity Management: up in chair  Pressure Reduction Techniques: frequent  weight shift encouraged  Taken 12/23/2024 1000 by Synthia Elda SAUNDERS, RN  Activity Management: up in chair  Pressure Reduction Techniques: frequent weight shift encouraged  Taken 12/23/2024 0800 by Teddie Curd R, RN  Activity Management: back to bed  Pressure Reduction Techniques: frequent weight shift encouraged  Head of Bed (HOB) Positioning: HOB elevated  Pressure Reduction Devices: positioning supports utilized  Skin Protection: incontinence pads utilized

## 2024-12-23 NOTE — Discharge Summary (Signed)
 Physician Discharge Summary [facility]  [facility]  [address]  [place] [address]  Dept: [phone]  Loc: [phone]     Identifying Information:   Barbara Huber  11-Aug-1941  [id number]    Primary Care Physician: [name], MD     Code Status: Full Code    Admit Date: 12/16/2024    Discharge Date: 12/23/2024     Discharge To: Home with Home Health and/or PT/OT    Discharge Service: [facility] - Geriatrics Floor Team (MED A - Tower)     Discharge Attending Physician: No att. providers found    Discharge Diagnoses:   Principal Problem:    Shortness of breath (POA: Yes)  Active Problems:    Essential hypertension (POA: Yes)    AKI (acute kidney injury) (POA: Yes)    Atrial fibrillation    (CMS-HCC) (POA: Yes)    Chronic kidney disease ([name]), stage IV (severe)    (CMS-HCC) (POA: Yes)    Chronic heart failure with preserved ejection fraction ([name]) (CMS-HCC) (POA: Yes)    Hereditary amyloidosis    (CMS-HCC) (POA: Yes)    Dyspnea (POA: Yes)    Acute on chronic hypoxic respiratory failure    (CMS-HCC) (POA: Yes)    Leg weakness, bilateral (POA: Unknown)  Resolved Problems:    * No resolved hospital problems. *      Hospital Course:   Barbara Huber is a 83 y.o. female with PMH of [name] (amyloid), COPD, pHTN, [name]4, and afib that presented to [facility] with acute onset shortness of breath and bilateral lower extremity weakness. Please see below for a summary of her hospital course by problem.     Sudden Onset Shortness of Breath - c/f Volume Overload  Patient presented with sudden onset shortness of breath and weakness evening of 12/20.  Afebrile with heart rate in the 60s and normal blood pressure on arrival, satting within appropriate range on baseline 3 L. V/Q scan with low probability for PE. Also concerned for arrhythmia, orthostatic hypotension, vagal, general volume overloaded state with elevated BNP at 5600, although chest x-ray is not particularly convincing for volume overload and patient has no lower extremity edema, exam notable for JVD but patient has history of pulmonary hypertension making volume exam difficult. Initially given IV however held after Cr increased. On 12/26, restarted diuresis once Cr was down trending. Patient brought her home scale and weighed 169. Dry weight of 166/167. Discharged with regimen patient was taking prior to hospitalization, 100mg  torsemide  in AM, 40 in PM. Plan to have close f/up with cardiologist.     Bilateral leg weakness (improved)  Sudden onset weakness noted in bilateral legs prior to admission. Given weakness with hip flexion on exam and bilateral ankle myoclonus, considered neurologic etiology including upper motor neuron injury, radiculopathy, lumbar strain, transverse myelitis (though less likely with symptom onset). CT head without acute findings. MRI lumbar spine demonstrated vertebral compression fractures at L2/L3 along with multilevel spondylolisthesis with associated neural foraminal narrowing, though appears relatively stable from prior and likely does not explain acute symptoms. Possibly related to acute urinary infection as below. Improved with supportive care and treatment of infection. Discharged with home PT/OT.     UTI  Patient noted to have UA on admission with large leukocyte esterase and positive nitrate, few bacteria. Patient afebrile, asymptomatic without dysuria or increased urinary frequency. Urine culture growing Pseudomonas and Klebsiella. Possible that urinary infection is contributing to above symptoms/presentation as workup has otherwise been largely unremarkable.  Initially started on IV Zosyn , transitioned to PO ciprofloxacin to complete remainder of 5 day course (end of treatment 12/26) after susceptibilities returned.     AKI on [name]  Baseline serum Cr variable, most recently appears 1.8-2.2. Mildly elevated at 2.4 on admission. Initially improved following [name], then intervally worsened. Stabilized today.  Suspect she has a very tenuous volume status given her underlying [name]/amyloid and pulmonary hypertension and is sensitive to small fluid shifts, may have a pre-renal component after receiving [name] on admission. Repeat proBNP mildly elevated. By time of discharge, Cr was down trending. Restarted PO diuretics. Plan for f/up labs within the week.     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 Diuretic regimen was adjusted during admission in the setting of AKI, but discharged on prior home regimen of torsemide  and spiro. Metolazone  held. Continued on Midodrine  as well.      Persistent Atrial Fibrillation  Patient was previously on anticoagulation with Eliquis , which was stopped due to recurrent anemia, most recently stopped 06/2024.  Does not want invasive procedures like PPM, AVN ablation, not interested in a watchman. Continued on amiodarone  during admission.       The patient's hospital stay has been complicated by the following clinically significant conditions requiring additional evaluation and treatment or having a significant effect of this patient's care: - Chronic kidney disease POA requiring further investigation, treatment, or monitoring     Outpatient Provider Follow Up Issues:   [ ]  f/up diuretic dosing  [ ]  repeat BMP to evaluate kidney function  [ ]  f/up home health - patient's insurance is changing 1/1, may impact home health and PT/OT  [ ]  f/up TSH with Dr. Myrna Skill with Outpatient Provider:  Warm Handoff: Completed on 12/23/2024 by Porter Carrel, MD  (Resident) via Epic Secure Chat    Procedures:  None  ______________________________________________________________________  Discharge Medications:      Your Medication List        PAUSE taking these medications      metOLazone  5 MG tablet  Wait to take this until your doctor or other care provider tells you to start again.  Commonly known as: ZAROXOLYN   Take 1 tablet (5 mg total) by mouth daily as needed (up to 3x weekly when instructed by cardiology clinic).            STOP taking these medications      potassium chloride  20 MEQ ER tablet     sodium chloride  3 % NEBULIZER solution            CHANGE how you take these medications      OXYGEN -AIR DELIVERY SYSTEMS MISC  4 L by Miscellaneous route continuous.  What changed: how much to take     torsemide  20 MG tablet  Commonly known as: DEMADEX   Take 5 tablets (100 mg total) by mouth every morning AND 2 tablets (40 mg total) daily with lunch. Take with food.  What changed: See the new instructions.            CONTINUE taking these medications      ACCU-CHEK GUIDE TEST STRIPS Strp  Generic drug: blood sugar diagnostic  Use to check blood sugar daily     ACCU-CHEK SOFTCLIX LANCETS lancets  Generic drug: lancets  CHECK BLOOD SUGAR DAILY     acetaminophen  500 MG tablet  Commonly known as: TYLENOL   Take 2 tablets (1,000 mg total) by  mouth Three (3) times a day as needed for pain.     ADVAIR  HFA 115-21 mcg/actuation inhaler  Generic drug: fluticasone  propion-salmeterol  INHALE 2 PUFFS TWICE A DAY     AEROCHAMBER MV inhaler  Generic drug: inhalational spacing device  1 spacer for inhaler     albuterol  2.5 mg /3 mL (0.083 %) nebulizer solution  Inhale 3 mL by nebulization every six (6) hours as needed for wheezing.     albuterol  90 mcg/actuation inhaler  Commonly known as: PROVENTIL  HFA;VENTOLIN  HFA  Inhale 1 puff every six (6) hours as needed for wheezing.     amiodarone  200 MG tablet  Commonly known as: PACERONE   Take 1 tablet (200 mg total) by mouth daily.     AMVUTTRA  25 mg/0.5 mL injection  Generic drug: vutrisiran   Inject 0.5 mL (25 mg total) under the skin Take as directed. Every 6 Weeks     cranberry 500 mg Cap  Take 500 mg by mouth daily with evening meal.     cyclobenzaprine  10 MG tablet  Commonly known as: FLEXERIL   Take 1 tablet (10 mg total) by mouth two (2) times a day as needed for muscle spasms.     ergocalciferol  1,250 mcg (50,000 unit) capsule  Commonly known as: DRISDOL   Take 1 capsule (1,250 mcg total) by mouth every fourteen (14) days.     ferrous sulfate  325 (65 FE) MG tablet  Take 1 tablet (325 mg total) by mouth in the morning.     fosfomycin  3 gram Pack  Commonly known as: MONUROL   Take 3 g by mouth once a week.     gabapentin  100 MG capsule  Commonly known as: NEURONTIN   Take 1 capsule (100 mg total) by mouth Three (3) times a day.     LIDOCAINE  PAIN RELIEF  4 % patch  Generic drug: lidocaine   Place 1 patch on the skin daily. Remove after 12 hours.     midodrine  5 MG tablet  Commonly known as: PROAMATINE   Take 1 tablet (5 mg total) by mouth three (3) times a day.     NARCAN 4 mg/actuation nasal spray  Generic drug: naloxone  1 spray into alternating nostrils once as needed (opioid overdose). PRN - Emergency use.     nystatin  100,000 unit/gram powder  Commonly known as: MYCOSTATIN   Apply to affected area 3 times daily     ondansetron  4 MG disintegrating tablet  Commonly known as: ZOFRAN -ODT  Dissolve 1 tablet (4 mg total) in the mouth every eight (8) hours as needed for nausea.     oxyCODONE  5 MG immediate release tablet  Commonly known as: ROXICODONE   Take 1 tablet (5 mg total) by mouth every eight (8) hours as needed for pain.     pantoprazole  20 MG tablet  Commonly known as: Protonix   TAKE 1 TABLET ONCE DAILY     rosuvastatin  5 MG tablet  Commonly known as: CRESTOR   TAKE 1 TABLET EVERY OTHER DAY     SPIRIVA  RESPIMAT 2.5 mcg/actuation inhalation mist  Generic drug: tiotropium bromide   Inhale 2 puffs daily.     spironolactone  25 MG tablet  Commonly known as: ALDACTONE   Take 0.5 tablets (12.5 mg total) by mouth daily.     vitamin A -3,000 mcg RAE (10,000 UNIT) 3,000 mcg RAE (10,000 UNIT) capsule  Take 1 capsule (3,000 mcg of RAE total) by mouth daily.     VYNDAMAX  61 mg Cap  Generic drug: tafamidis   Take 1 capsule (61 mg) by  mouth daily.              Allergies:  Nitrofurantoin and Lipitor  [atorvastatin ]  ______________________________________________________________________  Pending Test Results:  Pending Labs       Order Current Status    T3, Free In process            Most Recent Labs:  All lab results last 24 hours -   Recent Results (from the past 24 hours)   Basic Metabolic Panel    Collection Time: 12/23/24  7:00 AM   Result Value Ref Range    Sodium 141 135 - 145 mmol/L    Potassium 4.0 3.5 - 5.1 mmol/L    Chloride 100 98 - 107 mmol/L    CO2 26.4 20.0 - 31.0 mmol/L    Anion Gap 15 (H) 5 - 14 mmol/L    BUN 80 (H) 9 - 23 mg/dL    Creatinine 7.50 (H) 0.55 - 1.02 mg/dL    BUN/Creatinine Ratio 32     eGFR [name]-EPI (2021) Female 19 (L) >=60 mL/min/1.73m2    Glucose 101 70 - 179 mg/dL    Calcium  8.8 8.7 - 10.4 mg/dL   Magnesium  Level    Collection Time: 12/23/24  7:00 AM   Result Value Ref Range    Magnesium  2.4 1.6 - 2.6 mg/dL   Phosphorus Level    Collection Time: 12/23/24  7:00 AM   Result Value Ref Range    Phosphorus 3.6 2.4 - 5.1 mg/dL   T4, Free    Collection Time: 12/23/24  7:00 AM   Result Value Ref Range    Free T4 1.30 0.89 - 1.76 ng/dL       Relevant Studies/Radiology:  PVL Venous Duplex Lower Extremity Bilateral  Result Date: 12/19/2024   Peripheral Vascular Lab     [address]   [place], [place] [address]  PVL VENOUS DUPLEX LOWER EXTREMITY BILATERAL Patient Demographics Pt. Name: ROSELYNNE LORTZ Location: [facility] MRN:      [id number]      Sex:      F DOB:      1941-06-10    Age:      32 years  Study Information Authorizing         [id number] ALMARIE RAMAN    Performed Time      12/19/2024 2:34:31 Provider Name       SZUMEL                                    PM Ordering Physician  ALMARIE RAMAN SZUMEL    Patient Location    [facility] Accession Number    [id number] [facility]        Technologist        Marissa Pachlhofer                                                               RVT Diagnosis:                                Assisting  Technologist Ordered Reason For Exam: DVT Indication: BLE weakness; SOB Risk Factors: Decreased mobility. Anticoagulation: (Heparin ). Protocol The major deep veins from the inguinal ligament to the ankle are assessed for bilaterally for compressibility and color and spectral Doppler flow characteristics. The assessed veins include bilateral common femoral vein, femoral vein in the thigh, popliteal vein, and intramuscular calf veins. The iliac vein is assessed indirectly using Doppler waveform analysis. The great saphenous vein is assessed for compressibility at the saphenofemoral junction, and the small saphenous vein assessed for compressibility behind the knee.  Final Interpretation Right There is no evidence of DVT in the lower extremity. There is no evidence of obstruction proximal to the inguinal ligament or in the common femoral vein. Left There is no evidence of DVT in the lower extremity. There is no evidence of obstruction proximal to the inguinal ligament or in the common femoral vein.  Electronically signed by [id number] Almeda Dolores MD on 12/19/2024 at 4:08:25 PM.  -------------------------------------------------------------------------------- Right Duplex Findings All veins visualized appear fully compressible. Doppler flow signals demonstrate normal spontaneity, phasicity, and augmentation.  Left Duplex Findings All veins visualized appear fully compressible. Doppler flow signals demonstrate normal spontaneity, phasicity, and augmentation. Right Technical Summary No evidence of deep venous obstruction in the lower extremity. No indirect evidence of obstruction proximal to the inguinal ligament. Left Technical Summary No evidence of deep venous obstruction in the lower extremity. No indirect evidence of obstruction proximal to the inguinal ligament.  Final     MRI Lumbar Spine W Wo Contrast  Result Date: 12/18/2024  EXAM: Magnetic resonance imaging, lumbar spine without and with contrast. DATE: 12/18/2024 12:07 PM ACCESSION: [id number] UN DICTATED: 12/18/2024 1:52 PM INTERPRETATION LOCATION: [facility] CLINICAL INDICATION: 83 years old Female with pain  COMPARISON: Abdominal radiograph 08/21/2023, CT abdomen pelvis 08/15/2021 TECHNIQUE: Multiplanar MRI was performed through the lumbar spine prior to and following intravenous contrast administration. FINDINGS: Vertebral body height loss of L2 and L3 which appears progressed compared to prior CT abdomen pelvis from 08/15/2021, for example the L3 vertebral body measures approximately 1.6 cm in height, previously measuring 2.0 cm. L2-3 and inferior of L4 show compressed zones of trabeculation and bone marrow edema compatible with subacute fractures. There is overall similar degree of L1 vertebral body height loss relative to 2022. Multilevel focal endplate depressions progressed compared to prior (Schmorl's nodes). The visualized cord is unremarkable and the conus medullaris ends at a normal level. Increased STIR signal and enhancement at L4-L5. Grade 1 anterolisthesis of L5-S1. T12-L1: Disc height loss with posterior disc bulge with mild left subarticular zone narrowing. Mild bilateral neural foraminal narrowing. L1-L2: Disc height loss with posterior disc bulge with mild left subarticular zone narrowing. Mild bilateral neural foraminal narrowing, greater on the left. L2-L3: Disc height loss with left foraminal zone disc bulge. Mild bilateral neural foraminal narrowing, greater on the left. Ligamentum flavum thickening and facet arthrosis. L3-L4: Disc height loss without significant spinal canal narrowing. Mild left neural foraminal narrowing. L4-L5: Disc height loss with posterior disc bulge with bilateral foraminal zone narrowing. Moderate left and severe right neural foraminal narrowing with likely abutment of the exiting L4 nerve root (6:2). Ligamentum flavum thickening and facet arthrosis. L5-S1: Posterior disc bulge with mild canal narrowing. Moderate left and severe right neural foraminal narrowing with abutment of the exiting L5 nerve root (6:3). Ligamentum flavum thickening and facet arthrosis. Mild right renal pelviectasis. Left ventral wall defect with extruded loops of small bowel. No evidence of bowel obstruction. For the purposes  of this dictation, the lowest well formed intervertebral disc space is assumed to be the L5-S1 level, and there are presumed to be five lumbar-type vertebral bodies.     -Subacute vertebral body fractures with height loss of L2 and L3 which appear progressed compared to prior CT abdomen/pelvis from 08/15/2021. There are multilevel prominent endplate deformities which have progressed from prior MRI. - Multilevel lumbar spondylolisthesis that is most severe at L4-L5 and L5-S1 with neural foraminal narrowing at these levels.     CT Head Wo Contrast  Result Date: 12/18/2024  EXAM: Computed tomography, head or brain without contrast material. ACCESSION: [id number] UN CLINICAL INDICATION: 83 years old Female with cervical spine, fall with head strike COMPARISON: CT HEAD WO CONTRAST 06/03/2021 TECHNIQUE: Axial CT images of the head from skull base to vertex without contrast. FINDINGS: Moderate to severe global cerebral atrophy with ex vacuo dilation of the CSF spaces, similar to prior. Patchy hypodensities within the periventricular and deep white matter, nonspecific, but compatible with moderate chronic small vessel ischemic changes. There is no midline shift. No mass lesion. There is no evidence of acute infarct.  The sinuses are pneumatized. Bilateral supraclinoid ICA moderate calcific atheromatous plaque. No intracranial hemorrhage or skull fractures.     No acute intracranial abnormalities.     NM Lung Scan Perfusion Imaging Only  Result Date: 12/18/2024  EXAM: NM LUNG SCAN PERFUSION IMAGING ONLY DATE: 12/18/2024 9:03 AM ACCESSION: [id number] UN DICTATED: 12/18/2024 9:06 AM INTERPRETATION LOCATION: [facility] CLINICAL INDICATION: 83 years old Female: eval for PE sudden shortness of breath)  RADIOPHARMACEUTICAL: Perfusion: Tc-38m MAA, 4.4 mCi, IV  TECHNIQUE: Following inhalation of [place] Tc 98m-labeled carbon inhalation, images were acquired in Anterior and Posterior projections. For the perfusion portion of the exam, Anterior Posterior views of the chest were obtained following the intravenous injection Tc-98m MAA. COMPARISON: Chest radiograph 12/16/2024  FINDINGS: Perfusion: Possible single segmental perfusion defect within the left lingula. This is suspected to be artifactual in the setting of patient's heart border/cardiomegaly.      Possible single segmental perfusion defect within the lingula, which may reflect overlying cardiac border. Findings are considered nondiagnostic (low or intermediate probability) according to the perfusion only modified PIOPED II criteria.    ______________________________________________________________________  Discharge Instructions:   Activity Instructions       Activity as tolerated                  Resources and Referrals    Home health has been set up for through the agency listed below. The Home health agency will be contacting you to set up a time for them to come see you in your home within 2 days of your discharge.  If you have not heard from them prior to 12/22/24 or you have any questions about home health, please contact them at the phone number listed below.    Home Care [facility]  - Admitted Since 12/16/2024       Service Provider Services Address Referrals Phone Referrals Fax Patient Preferred    [facility] Primary: Home Health Care  Secondary: Home Occupational Therapy, Home Physical Therapy [address] [place], [place] [address] [phone] --                      Follow Up instructions and Outpatient Referrals     Ambulatory Referral to Home Health      Reason for referral: Deconditioning, HF, recent hosptilization  Physician to follow patient's care: PCP    Disciplines requested:  Physical Therapy  Occupational Therapy       Physical Therapy requested: Evaluate and treat    Occupational Therapy Requested: Evaluate and treat    Requested SOC Date:  Comment - Within 48 hours of hospital discharge    Call MD for:  difficulty breathing, headache or visual disturbances      Call MD for:  persistent nausea or vomiting      Call MD for:  severe uncontrolled pain      Call MD for:  temperature >38.5 Celsius      Discharge instructions          Appointments which have been scheduled for you      Jan 26, 2025 1:30 PM  (Arrive by 1:15 PM)  RETURN HEART FAILURE with Damien Brodie Lee, AGNP  [facility] CARDIOLOGY [place] [facility] REGION) [address]  [facility] [place] [address]  [place] [address]  [phone]        Jan 26, 2025 2:45 PM  (Arrive by 2:30 PM)  INFUSION ONLY with UNCTIF 21  [facility] THERAPEUTIC INFUSION CTR [place] [facility] REGION) [address]  [facility] [address]  [place] [address]  [phone]             ______________________________________________________________________  Discharge Day Services:  BP 107/53  - Pulse 55  - Temp 37.1 ??C (98.8 ??F) (Oral)  - Resp 16  - Ht 175.3 cm (5' 9)  - Wt 77.2 kg (170 lb 1.6 oz)  - LMP  (LMP Unknown)  - SpO2 99%  - BMI 25.12 kg/m??     Pt seen on the day of discharge and determined appropriate for discharge.    Condition at Discharge: good    Length of Discharge: I spent greater than 30 mins in the discharge of this patient.

## 2024-12-25 ENCOUNTER — Ambulatory Visit: Admit: 2024-12-25 | Discharge: 2024-12-26 | Payer: MEDICAID | Attending: Adult Health | Primary: Adult Health

## 2024-12-25 DIAGNOSIS — I5032 Chronic diastolic (congestive) heart failure: Principal | ICD-10-CM

## 2024-12-25 LAB — BASIC METABOLIC PANEL
ANION GAP: 14 mmol/L (ref 5–14)
BLOOD UREA NITROGEN: 67 mg/dL — ABNORMAL HIGH (ref 9–23)
BUN / CREAT RATIO: 27
CALCIUM: 9.3 mg/dL (ref 8.7–10.4)
CHLORIDE: 101 mmol/L (ref 98–107)
CO2: 24 mmol/L (ref 20.0–31.0)
CREATININE: 2.51 mg/dL — ABNORMAL HIGH (ref 0.55–1.02)
EGFR CKD-EPI (2021) FEMALE: 19 mL/min/1.73m2 — ABNORMAL LOW (ref >=60)
GLUCOSE RANDOM: 110 mg/dL (ref 70–179)
POTASSIUM: 3.8 mmol/L (ref 3.4–4.8)
SODIUM: 139 mmol/L (ref 135–145)

## 2024-12-25 LAB — PRO-BNP: PRO-BNP: 11957 pg/mL — ABNORMAL HIGH (ref <=300.0)

## 2024-12-25 LAB — MAGNESIUM: MAGNESIUM: 2.2 mg/dL (ref 1.6–2.6)

## 2024-12-25 MED ADMIN — furosemide (LASIX) injection 120 mg: 120 mg | INTRAVENOUS | @ 19:00:00 | Stop: 2024-12-25

## 2024-12-25 NOTE — Progress Notes (Signed)
 [place] [name] Note    Primary Cardiologist/Cardiology Provider(s):    PCP:  [name], MD    Last Cardiology Clinic Visit:  12/06/2024  Last [name] Visit: 08/2023    Reason for Visit:  Barbara Huber is a 83 y.o. female with a history of [name], who is being seen today in the [name] for an urgent visit for volume status optimization including [name].    Assessment/Plan:  Acute on Chronic [name]  - Volume status today is increased.  - NYHA Class II-III  - IV Lasix  120 mg was administered during clinic  - Labs were obtained: Cr 2.51, K 3.8, pro-BNP 11957  - Increase torsemide  back to 160mg  a day (as 100mg  qAM, 60mg  qPM)  - metolazone  x 1 tomorrow    Response to [name]:         Medications Heart rate Blood pressure Weight (lbs)   Pre  64 101/68 170   Hour 0 IV Lasix  120 mg      Hour 2 IV Lasix  0 mg      Post             Output:  I/O       None            Return to clinic:    Return in about 1 month (around 01/25/2025) for Return HF.    Future Appointments   Date Time Provider Department Center   01/26/2025  1:30 PM [name] Damien Brodie [facility]   01/26/2025  2:45 PM UNCTIF 21 [facility]       History of Present Illness:  Barbara Huber is a 83 y.o. female with a history of [name] who presents today for [name]. Pt was admitted to [facility] from 12/20-12/27 for sudden onset shortness of breath and weakness when she was transferring from commode to bed.  Afebrile with heart rate in the 60s and normal blood pressure on arrival, satting within appropriate range on baseline 3 L. V/Q scan with low probability for PE. Initially given IV Lasix  however held after Cr increased. On 12/26, restarted diuresis once Cr was down-trending. Discharged with 100mg  torsemide  in AM, 40 in PM. Patient noted to have UA on admission with large leukocyte esterase and positive nitrate, few bacteria. Patient afebrile, asymptomatic without dysuria or increased urinary frequency. Urine culture growing Pseudomonas and Klebsiella. Possible that urinary infection is contributing to above symptoms/presentation as workup has otherwise been largely unremarkable. Initially started on IV Zosyn , transitioned to PO ciprofloxacin to complete remainder of 5 day course (end of treatment 12/26) after susceptibilities returned.     Today presents for follow up with her daughter. Pt states that she was not SOB prior to admission; that was not why she went to the hospital. She went because her legs got weak very suddenly, felt like noodles. She was not more SOB than usual. Her weight is up since discharge, she was 166lbs last Saturday and this morning was 170lbs. She had been staying around 164-165. When she gets up to 167-168, her daughter gives her metolazone  which usually works within [employer]. She had been taking torsemide  80mg  BID prior to admission. Yesterday took 100mg  in the AM and 40mg  in the PM.   She is not SOB, has a little bit of swelling in the legs. She doesn't feel weak like she did.     Past Medical History[1]  Past Surgical History[2]    Current  Medications[3]  Allergies[4]    Objective:      Physical Exam  BP 101/68 (BP Site: R Arm, BP Position: Sitting, BP Cuff Size: Medium)  - Pulse 64  - Ht 167.6 cm (5' 6)  - Wt 77.1 kg (170 lb)  - LMP  (LMP Unknown)  - SpO2 99%  - PF (!) 3 L/min  - BMI 27.44 kg/m??    Wt Readings from Last 12 Encounters:   12/25/24 77.1 kg (170 lb)   12/23/24 77.2 kg (170 lb 1.6 oz)   12/13/24 75.3 kg (166 lb)   11/03/24 75.2 kg (165 lb 11.2 oz)   10/18/24 75.8 kg (167 lb)   10/16/24 75.8 kg (167 lb)   09/14/24 75.3 kg (166 lb)   09/04/24 74.3 kg (163 lb 12.8 oz)   08/02/24 76.7 kg (169 lb)   08/02/24 76.2 kg (168 lb)   07/31/24 76.6 kg (168 lb 14.4 oz)   07/20/24 77.5 kg (170 lb 12.8 oz)       General:  Patient is well-appearing in no acute distress   Eyes:  Intact, sclerae anicteric.   Ears, nose, mouth: Benign   Respiratory:   Decreased breath sounds at bases  There are no wheezes.   Cardiovascular:  JVP to ear with HOB at 90degrees.  Rate and rhythm are regular.  There is no lifts or heaves.  Normal S1, S2. There is no murmur, gallops or rubs.  Radial and pedal pulses are 2+, bilaterally.   There is 1+ pitting pedal/pretibial edema, bilaterally.    Gastrointestinal:   Soft, non-tender, with audible bowel sounds. Abdomen nondistended.  Liver is nonpalpable.   Musculoskeletal: No joint swelling   Skin: Warm, well perfused.   Neurologic: Appropriate mood and affect. Alert and oriented to person, place, and time. No gross motor or sensory deficits evident.     Recent Labs:  Office Visit on 12/25/2024   Component Date Value Ref Range Status    Magnesium  12/25/2024 2.2  1.6 - 2.6 mg/dL Final    PRO-BNP 87/70/7974 11,957.0 (H)  <=300.0 pg/mL Final    Sodium 12/25/2024 139  135 - 145 mmol/L Final    Potassium 12/25/2024 3.8  3.4 - 4.8 mmol/L Final    Chloride 12/25/2024 101  98 - 107 mmol/L Final    CO2 12/25/2024 24.0  20.0 - 31.0 mmol/L Final    Anion Gap 12/25/2024 14  5 - 14 mmol/L Final    BUN 12/25/2024 67 (H)  9 - 23 mg/dL Final    Creatinine 87/70/7974 2.51 (H)  0.55 - 1.02 mg/dL Final    BUN/Creatinine Ratio 12/25/2024 27   Final    eGFR [name]-EPI (2021) Female 12/25/2024 19 (L)  >=60 mL/min/1.28m2 Final    Glucose 12/25/2024 110  70 - 179 mg/dL Final    Calcium  12/25/2024 9.3  8.7 - 10.4 mg/dL Final   Admission on 87/79/7974, Discharged on 12/23/2024   Component Date Value Ref Range Status    EKG Ventricular Rate 12/16/2024 74  BPM Final    EKG Atrial Rate 12/16/2024 74  BPM Final    EKG P-R Interval 12/16/2024 210  ms Final    EKG QRS Duration 12/16/2024 124  ms Final    EKG Q-T Interval 12/16/2024 434  ms Final    EKG QTC Calculation 12/16/2024 481  ms Final    EKG Calculated P Axis 12/16/2024 57  degrees Final    EKG Calculated R Axis 12/16/2024 225  degrees Final  EKG Calculated T Axis 12/16/2024 35  degrees Final    QTC Fredericia 12/16/2024 465  ms Final PRO-BNP 12/17/2024 5,642.0 (H)  <=300.0 pg/mL Final    hsTroponin I 12/17/2024 62 (HH)  <=34 ng/L Final    Adenovirus 12/17/2024 Not Detected  Not Detected Final    Coronavirus HKU1 12/17/2024 Not Detected  Not Detected Final    Coronavirus NL63 12/17/2024 Not Detected  Not Detected Final    Coronavirus 229E 12/17/2024 Not Detected  Not Detected Final    Coronavirus OC43 PCR 12/17/2024 Not Detected  Not Detected Final    Metapneumovirus 12/17/2024 Not Detected  Not Detected Final    Rhinovirus/Enterovirus 12/17/2024 Not Detected  Not Detected Final    Influenza A 12/17/2024 Not Detected  Not Detected Final    Influenza B 12/17/2024 Not Detected  Not Detected Final    Parainfluenza 1 12/17/2024 Not Detected  Not Detected Final    Parainfluenza 2 12/17/2024 Not Detected  Not Detected Final    Parainfluenza 3 12/17/2024 Not Detected  Not Detected Final    Parainfluenza 4 12/17/2024 Not Detected  Not Detected Final    RSV 12/17/2024 Not Detected  Not Detected Final    Chlamydophila (Chlamydia) pneumoni* 12/17/2024 Not Detected  Not Detected Final    Mycoplasma pneumoniae 12/17/2024 Not Detected  Not Detected Final    SARS-CoV-2 PCR 12/17/2024 Not Detected  Not Detected Final    WBC 12/17/2024 7.1  3.6 - 11.2 10*9/L Final    RBC 12/17/2024 3.80 (L)  3.95 - 5.13 10*12/L Final    HGB 12/17/2024 11.6  11.3 - 14.9 g/dL Final    HCT 87/78/7974 34.8  34.0 - 44.0 % Final    MCV 12/17/2024 91.7  77.6 - 95.7 fL Final    MCH 12/17/2024 30.5  25.9 - 32.4 pg Final    MCHC 12/17/2024 33.3  32.0 - 36.0 g/dL Final    RDW 87/78/7974 13.7  12.2 - 15.2 % Final    MPV 12/17/2024 8.4  6.8 - 10.7 fL Final    Platelet 12/17/2024 160  150 - 450 10*9/L Final    nRBC 12/17/2024 0  <=4 /100 WBCs Final    Neutrophils % 12/17/2024 73.3  % Final    Lymphocytes % 12/17/2024 11.2  % Final    Monocytes % 12/17/2024 12.3  % Final    Eosinophils % 12/17/2024 2.4  % Final    Basophils % 12/17/2024 0.8  % Final    Absolute Neutrophils 12/17/2024 5.2 1.8 - 7.8 10*9/L Final    Absolute Lymphocytes 12/17/2024 0.8 (L)  1.1 - 3.6 10*9/L Final    Absolute Monocytes 12/17/2024 0.9 (H)  0.3 - 0.8 10*9/L Final    Absolute Eosinophils 12/17/2024 0.2  0.0 - 0.5 10*9/L Final    Absolute Basophils 12/17/2024 0.1  0.0 - 0.1 10*9/L Final    Color, UA 12/17/2024 Light Yellow   Final    Clarity, UA 12/17/2024 Turbid   Final    Specific Gravity, UA 12/17/2024 1.011  1.003 - 1.030 Final    pH, UA 12/17/2024 5.5  5.0 - 9.0 Final    Leukocyte Esterase, UA 12/17/2024 Large (A)  Negative Final    Nitrite, UA 12/17/2024 Positive (A)  Negative Final    Protein, UA 12/17/2024 Negative  Negative Final    Glucose, UA 12/17/2024 Negative  Negative Final    Ketones, UA 12/17/2024 Negative  Negative Final    Urobilinogen, UA 12/17/2024 <2.0 mg/dL  <7.9 mg/dL Final  Bilirubin, UA 12/17/2024 Negative  Negative Final    Blood, UA 12/17/2024 Moderate (A)  Negative Final    RBC, UA 12/17/2024 7 (H)  <=4 /HPF Final    WBC, UA 12/17/2024 107 (H)  0 - 5 /HPF Final    Squam Epithel, UA 12/17/2024 1  0 - 5 /HPF Final    Bacteria, UA 12/17/2024 Few (A)  None Seen /HPF Final    Hyaline Casts, UA 12/17/2024 11 (H)  0 - 1 /LPF Final    Mucus, UA 12/17/2024 Rare (A)  None Seen /HPF Final    Sodium 12/17/2024 137  135 - 145 mmol/L Final    Potassium 12/17/2024 3.8  3.4 - 4.8 mmol/L Final    Chloride 12/17/2024 96 (L)  98 - 107 mmol/L Final    CO2 12/17/2024 22.6  20.0 - 31.0 mmol/L Final    Anion Gap 12/17/2024 18 (H)  5 - 14 mmol/L Final    BUN 12/17/2024 66 (H)  9 - 23 mg/dL Final    Creatinine 87/78/7974 2.39 (H)  0.55 - 1.02 mg/dL Final    BUN/Creatinine Ratio 12/17/2024 28   Final    eGFR [name]-EPI (2021) Female 12/17/2024 20 (L)  >=60 mL/min/1.69m2 Final    Glucose 12/17/2024 107  70 - 179 mg/dL Final    Calcium  12/17/2024 9.2  8.7 - 10.4 mg/dL Final    Albumin 87/78/7974 3.1 (L)  3.4 - 5.0 g/dL Final    Total Protein 12/17/2024 7.8  5.7 - 8.2 g/dL Final    Total Bilirubin 12/17/2024 0.3  0.3 - 1.2 mg/dL Final    AST 87/78/7974 19  <=34 U/L Final    ALT 12/17/2024 <7 (L)  10 - 49 U/L Final    Alkaline Phosphatase 12/17/2024 99  46 - 116 U/L Final    hsTroponin I 12/17/2024 72 (HH)  <=34 ng/L Final    delta hsTroponin I 12/17/2024 10 (H)  <=7 ng/L Final    hsTroponin I 12/17/2024 73 (HH)  <=34 ng/L Final    delta hsTroponin I 12/17/2024 1  <=7 ng/L Final    Urine Culture, Comprehensive 12/17/2024 50,000 to 100,000 CFU/mL Pseudomonas aeruginosa (A)   Final    Urine Culture, Comprehensive 12/17/2024 10,000 to 50,000 CFU/mL Klebsiella pneumoniae (A)   Final    D-Dimer 12/18/2024 2,813 (H)  <=500 ng/mL FEU Final    Sodium 12/18/2024 138  135 - 145 mmol/L Final    Potassium 12/18/2024 4.1  3.4 - 4.8 mmol/L Final    Chloride 12/18/2024 96 (L)  98 - 107 mmol/L Final    CO2 12/18/2024 26.7  20.0 - 31.0 mmol/L Final    Anion Gap 12/18/2024 15 (H)  5 - 14 mmol/L Final    BUN 12/18/2024 71 (H)  9 - 23 mg/dL Final    Creatinine 87/77/7974 2.13 (H)  0.55 - 1.02 mg/dL Final    BUN/Creatinine Ratio 12/18/2024 33   Final    eGFR [name]-EPI (2021) Female 12/18/2024 23 (L)  >=60 mL/min/1.68m2 Final    Glucose 12/18/2024 103  70 - 179 mg/dL Final    Calcium  12/18/2024 9.1  8.7 - 10.4 mg/dL Final    Magnesium  12/18/2024 2.2  1.6 - 2.6 mg/dL Final    Phosphorus 87/77/7974 4.3  2.4 - 5.1 mg/dL Final    TSH 87/77/7974 5.265 (H)  0.550 - 4.780 uIU/mL Final    Sodium 12/19/2024 137  135 - 145 mmol/L Final    Potassium 12/19/2024 3.8  3.4 -  4.8 mmol/L Final    Chloride 12/19/2024 95 (L)  98 - 107 mmol/L Final    CO2 12/19/2024 24.9  20.0 - 31.0 mmol/L Final    Anion Gap 12/19/2024 17 (H)  5 - 14 mmol/L Final    BUN 12/19/2024 68 (H)  9 - 23 mg/dL Final    Creatinine 87/76/7974 2.44 (H)  0.55 - 1.02 mg/dL Final    BUN/Creatinine Ratio 12/19/2024 28   Final    eGFR [name]-EPI (2021) Female 12/19/2024 19 (L)  >=60 mL/min/1.70m2 Final    Glucose 12/19/2024 103  70 - 179 mg/dL Final    Calcium  12/19/2024 9.3  8.7 - 10.4 mg/dL Final    Magnesium  12/19/2024 2.2  1.6 - 2.6 mg/dL Final    Phosphorus 87/76/7974 4.3  2.4 - 5.1 mg/dL Final    T Albumin 87/76/7974 2.7 (L)  3.5 - 5.0 g/dL Final    Alpha-1 Globulin 12/19/2024 0.4  0.2 - 0.5 g/dL Final    Alpha-2 Globulin 12/19/2024 0.8  0.5 - 1.1 g/dL Final    Beta-1 Globulin 12/19/2024 0.4  0.3 - 0.6 g/dL Final    Beta-2 Globulin 12/19/2024 0.6  0.2 - 0.6 g/dL Final    Gammaglobulin 12/19/2024 1.6 (H)  0.5 - 1.5 g/dL Final    SPE Interpretation 12/19/2024    Final    Immunofixation Electrophoresis, Se* 12/19/2024    Final    Total Protein 12/19/2024 6.4  g/dL Final    Kappa Free, Serum 12/19/2024 11.15 (H)  0.33 - 1.94 mg/dL Final    Lambda Free, Serum 12/19/2024 9.70 (H)  0.57 - 2.63 mg/dL Final    K/L FLC Ratio 12/19/2024 1.15  0.26 - 1.65 Final    Vitamin D  Total (25OH) 12/19/2024 31.2  20.0 - 80.0 ng/mL Final    Sodium 12/20/2024 139  135 - 145 mmol/L Final    Potassium 12/20/2024 3.7  3.4 - 4.8 mmol/L Final    Chloride 12/20/2024 95 (L)  98 - 107 mmol/L Final    CO2 12/20/2024 26.7  20.0 - 31.0 mmol/L Final    Anion Gap 12/20/2024 17 (H)  5 - 14 mmol/L Final    BUN 12/20/2024 75 (H)  9 - 23 mg/dL Final    Creatinine 87/75/7974 2.61 (H)  0.55 - 1.02 mg/dL Final    BUN/Creatinine Ratio 12/20/2024 29   Final    eGFR [name]-EPI (2021) Female 12/20/2024 18 (L)  >=60 mL/min/1.69m2 Final    Glucose 12/20/2024 97  70 - 179 mg/dL Final    Calcium  12/20/2024 9.1  8.7 - 10.4 mg/dL Final    Magnesium  12/20/2024 2.2  1.6 - 2.6 mg/dL Final    Phosphorus 87/75/7974 4.3  2.4 - 5.1 mg/dL Final    Sodium 87/74/7974 140  135 - 145 mmol/L Final    Potassium 12/21/2024 4.4  3.5 - 5.1 mmol/L Final    Chloride 12/21/2024 97 (L)  98 - 107 mmol/L Final    CO2 12/21/2024 27.9  20.0 - 31.0 mmol/L Final    Anion Gap 12/21/2024 15 (H)  5 - 14 mmol/L Final    BUN 12/21/2024 88 (H)  9 - 23 mg/dL Final    Creatinine 87/74/7974 2.81 (H)  0.55 - 1.02 mg/dL Final    BUN/Creatinine Ratio 12/21/2024 31   Final    eGFR [name]-EPI (2021) Female 12/21/2024 16 (L)  >=60 mL/min/1.30m2 Final    Glucose 12/21/2024 82  70 - 179 mg/dL Final    Calcium  12/21/2024 9.2  8.7 - 10.4 mg/dL Final    Magnesium  12/21/2024 2.2  1.6 - 2.6 mg/dL Final    Phosphorus 87/74/7974 4.8  2.4 - 5.1 mg/dL Final    PRO-BNP 87/74/7974 6,299.0 (H)  <=300.0 pg/mL Final    Sodium 12/22/2024 140  135 - 145 mmol/L Final    Potassium 12/22/2024 4.1  3.5 - 5.1 mmol/L Final    Chloride 12/22/2024 98  98 - 107 mmol/L Final    CO2 12/22/2024 25.5  20.0 - 31.0 mmol/L Final    Anion Gap 12/22/2024 17 (H)  5 - 14 mmol/L Final    BUN 12/22/2024 84 (H)  9 - 23 mg/dL Final    Creatinine 87/73/7974 2.61 (H)  0.55 - 1.02 mg/dL Final    BUN/Creatinine Ratio 12/22/2024 32   Final    eGFR [name]-EPI (2021) Female 12/22/2024 18 (L)  >=60 mL/min/1.23m2 Final    Glucose 12/22/2024 84  70 - 179 mg/dL Final    Calcium  12/22/2024 9.1  8.7 - 10.4 mg/dL Final    Magnesium  12/22/2024 2.4  1.6 - 2.6 mg/dL Final    Phosphorus 87/73/7974 4.4  2.4 - 5.1 mg/dL Final    Sodium 87/72/7974 141  135 - 145 mmol/L Final    Potassium 12/23/2024 4.0  3.5 - 5.1 mmol/L Final    Chloride 12/23/2024 100  98 - 107 mmol/L Final    CO2 12/23/2024 26.4  20.0 - 31.0 mmol/L Final    Anion Gap 12/23/2024 15 (H)  5 - 14 mmol/L Final    BUN 12/23/2024 80 (H)  9 - 23 mg/dL Final    Creatinine 87/72/7974 2.49 (H)  0.55 - 1.02 mg/dL Final    BUN/Creatinine Ratio 12/23/2024 32   Final    eGFR [name]-EPI (2021) Female 12/23/2024 19 (L)  >=60 mL/min/1.45m2 Final    Glucose 12/23/2024 101  70 - 179 mg/dL Final    Calcium  12/23/2024 8.8  8.7 - 10.4 mg/dL Final    Magnesium  12/23/2024 2.4  1.6 - 2.6 mg/dL Final    Phosphorus 87/72/7974 3.6  2.4 - 5.1 mg/dL Final    T3, Free 87/72/7974 1.85 (L)  2.30 - 4.20 pg/mL Final    Free T4 12/23/2024 1.30  0.89 - 1.76 ng/dL Final       Lab Results   Component Value Date    PRO-BNP 11,957.0 (H) 12/25/2024    PRO-BNP 6,299.0 (H) 12/21/2024    PRO-BNP 5,642.0 (H) 12/17/2024    PRO-BNP 3,261.0 (H) 11/03/2024    PRO-BNP 4,681.0 (H) 10/18/2024    Creatinine 2.51 (H) 12/25/2024    Creatinine 2.49 (H) 12/23/2024    Creatinine 2.61 (H) 12/22/2024    Creatinine 2.81 (H) 12/21/2024    Creatinine 2.61 (H) 12/20/2024    Creatinine 0.79 01/06/2011    BUN 67 (H) 12/25/2024    BUN 80 (H) 12/23/2024    BUN 84 (H) 12/22/2024    BUN 88 (H) 12/21/2024    BUN 75 (H) 12/20/2024    BUN 20 01/06/2011    Sodium 139 12/25/2024    Sodium 141 12/23/2024    Sodium 140 12/22/2024    Sodium 140 12/21/2024    Sodium 139 12/20/2024    Sodium 140 01/06/2011    Potassium 3.8 12/25/2024    Potassium 4.0 12/23/2024    Potassium 4.1 12/22/2024    Potassium 4.4 12/21/2024    Potassium 3.7 12/20/2024    Potassium 4.1 01/06/2011    Magnesium  2.2 12/25/2024    Magnesium  2.4  12/23/2024    Magnesium  2.4 12/22/2024    Magnesium  2.2 12/21/2024    Magnesium  2.2 12/20/2024    Magnesium  2.0 01/06/2011       Metric Tracker:  Did today's visit result in [name]? No  Did today's visit result in hospital admission? No  Did today's visit result in referral to cardiology? n/a  Today's visit was a referral from [facility]         [1]   Past Medical History:  Diagnosis Date    Acute exacerbation of CHF (congestive heart failure) (CMS-HCC) 08/19/2023    Acute kidney injury superimposed on chronic kidney disease 10/11/2020    Acute on chronic diastolic (congestive) heart failure (CMS-HCC) 08/23/2020    Acute on chronic diastolic congestive heart failure (CMS-HCC) 08/23/2020    AKI (acute kidney injury) 04/14/2015    Lab Results  Component  Value  Date     CREATININE  1.90 (H)  06/12/2021     Had a bump in her creatinine when she was taking her diuretics every day.  She is currently taking 40 mg daily of torsemide  and 50 mg of spironolactone .  Her volume status is fragile.  Previously when she stopped her diuretic she becomes short of breath.  Plan: We will check her BMP today.  We will likely have to go to 40    Anemia 08/22/2019    Iron  deficiency anemia          Lab Results Component    Value    Date           WBC    9.2    03/21/2021           RBC    3.67 (L)    03/21/2021           HGB    9.9 (L)    03/21/2021           HCT    30.7 (L)    03/21/2021           MCV    83.7    03/21/2021           MCH    26.9    03/21/2021           MCHC    32.1    03/21/2021           RDW    21.9 (H)    03/21/2021           PLT    318     Arthritis     Calculus of kidney     Calculus of ureter     CHF (congestive heart failure) (CMS-HCC)     Chronic atrial fibrillation    (CMS-HCC) 07/20/2019    COPD (chronic obstructive pulmonary disease) (CMS-HCC)     Diabetes (CMS-HCC)     Gangrenous cholecystitis 10/11/2020    Generalized edema  06/17/2021    GERD (gastroesophageal reflux disease)     Hydronephrosis     Hypertension     Hyponatremia 10/11/2020    Intermediate coronary syndrome (CMS-HCC) 03/13/2014    Lower extremity edema 09/28/2020    Lumbar stenosis     Microscopic hematuria     Nausea alone     Nephrolithiasis 04/17/2016    Neuropathy     Nocturia     Nocturia 07/01/2017    Other chronic cystitis     Persistent fatigue after COVID-19 06/03/2021    Patient with some  fatigue.  See plans for anemia, AKI  We are also tapering her gabapentin .  Currently on 300 mg 3 times daily.  We will decrease it to twice daily, and then nightly.  She states she is not having any recurrence of her pain.    Pulmonary hypertension    (CMS-HCC)     Renal colic     Shortness of breath 04/28/2020    Sleep apnea     Unstable angina pectoris    (CMS-HCC) 03/13/2014   [2]   Past Surgical History:  Procedure Laterality Date    BACK SURGERY  1995    CARPAL TUNNEL RELEASE Left 2014    HYSTERECTOMY  1971    IR INSERT CHOLECYSTOSTOMY TUBE PERCUTANEOUS  10/02/2020    IR INSERT CHOLECYSTOSMY TUBE PERCUTANEOUS 10/02/2020 Ivonne Butler Seed, MD IMG VIR H&V [facility]    LUMBAR DISC SURGERY      PR REMOVAL GALLBLADDER N/A 10/06/2020    Procedure: CHOLECYSTECTOMY;  Surgeon: Curtistine Mira Dunnings, MD;  Location: MAIN OR [facility]; Service: Trauma    PR RIGHT HEART CATH O2 SATURATION & CARDIAC OUTPUT N/A 09/30/2020    Procedure: Right Heart Catheterization;  Surgeon: Reyes Jerilynn Sage, MD;  Location: [facility] CATH;  Service: Cardiology    PR RIGHT HEART CATH O2 SATURATION & CARDIAC OUTPUT N/A 01/09/2021    Procedure: Right Heart Catheterization;  Surgeon: Norleen Deward Grumbling, MD;  Location: [facility] CATH;  Service: Cardiology   [3]   Current Outpatient Medications   Medication Sig Dispense Refill    acetaminophen  (TYLENOL ) 500 MG tablet Take 2 tablets (1,000 mg total) by mouth Three (3) times a day as needed for pain.      albuterol  2.5 mg /3 mL (0.083 %) nebulizer solution Inhale 3 mL by nebulization every six (6) hours as needed for wheezing.      albuterol  HFA 90 mcg/actuation inhaler Inhale 1 puff every six (6) hours as needed for wheezing. 8 g 3    amiodarone  (PACERONE ) 200 MG tablet Take 1 tablet (200 mg total) by mouth daily. 30 tablet 1    blood sugar diagnostic (ACCU-CHEK GUIDE TEST STRIPS) Strp Use to check blood sugar daily 200 strip 6    cranberry 500 mg cap Take 500 mg by mouth daily with evening meal.      cyclobenzaprine  (FLEXERIL ) 10 MG tablet Take 1 tablet (10 mg total) by mouth two (2) times a day as needed for muscle spasms. 60 tablet 11    ergocalciferol -1,250 mcg, 50,000 unit, (DRISDOL ) 1,250 mcg (50,000 unit) capsule Take 1 capsule (1,250 mcg total) by mouth every fourteen (14) days. 6 capsule 1    ferrous sulfate  325 (65 FE) MG tablet Take 1 tablet (325 mg total) by mouth in the morning. 90 tablet 0    fosfomycin  (MONUROL ) 3 gram Pack Take 3 g by mouth once a week. 4 packet 5    gabapentin  (NEURONTIN ) 100 MG capsule Take 1 capsule (100 mg total) by mouth Three (3) times a day. 270 capsule 3    inhalational spacing device (AEROCHAMBER MV) Spcr 1 spacer for inhaler 1 each 0    lancets (ACCU-CHEK SOFTCLIX LANCETS) Misc CHECK BLOOD SUGAR DAILY 200 each 11    lidocaine  (ASPERCREME) 4 % patch Place 1 patch on the skin daily. Remove after 12 hours. 30 patch 2    [Paused] metOLazone  (ZAROXOLYN ) 5 MG tablet Take 1 tablet (5 mg total) by mouth daily as needed (up to 3x weekly when instructed by cardiology  clinic). 12 tablet 0    midodrine  (PROAMATINE ) 5 MG tablet Take 1 tablet (5 mg total) by mouth three (3) times a day. 90 tablet 3    NARCAN 4 mg/actuation nasal spray 1 spray into alternating nostrils once as needed (opioid overdose). PRN - Emergency use.      nystatin  (MYCOSTATIN ) 100,000 unit/gram powder Apply to affected area 3 times daily 30 g 1    ondansetron  (ZOFRAN -ODT) 4 MG disintegrating tablet Dissolve 1 tablet (4 mg total) in the mouth every eight (8) hours as needed for nausea.      oxyCODONE  (ROXICODONE ) 5 MG immediate release tablet Take 1 tablet (5 mg total) by mouth every eight (8) hours as needed for pain.      OXYGEN -AIR DELIVERY SYSTEMS MISC 4 L by Miscellaneous route continuous. (Patient taking differently: 3 L by Miscellaneous route continuous.)      pantoprazole  (PROTONIX ) 20 MG tablet TAKE 1 TABLET ONCE DAILY 100 tablet 3    rosuvastatin  (CRESTOR ) 5 MG tablet TAKE 1 TABLET EVERY OTHER DAY 45 tablet 3    spironolactone  (ALDACTONE ) 25 MG tablet Take 0.5 tablets (12.5 mg total) by mouth daily. 30 tablet 2    tafamidis  (VYNDAMAX ) 61 mg cap Take 1 capsule (61 mg) by mouth daily. 30 capsule 11    tiotropium bromide  (SPIRIVA  RESPIMAT) 2.5 mcg/actuation inhalation mist Inhale 2 puffs daily. 4 g 11    torsemide  (DEMADEX ) 20 MG tablet Take 5 tablets (100 mg total) by mouth every morning AND 2 tablets (40 mg total) daily with lunch. Take with food. 210 tablet 2    vitamin A -3,000 mcg RAE, 10,000 UNIT, 3,000 mcg RAE (10,000 UNIT) capsule Take 1 capsule (3,000 mcg of RAE total) by mouth daily.      vutrisiran  (AMVUTTRA ) 25 mg/0.5 mL injection Inject 0.5 mL (25 mg total) under the skin Take as directed. Every 6 Weeks      fluticasone  propion-salmeterol (ADVAIR  HFA) 115-21 mcg/actuation inhaler INHALE 2 PUFFS TWICE A DAY (Patient not taking: Reported on 12/25/2024) 12 g 5     No current facility-administered medications for this visit.   [4]   Allergies  Allergen Reactions    Nitrofurantoin Anaphylaxis and Other (See Comments)    Lipitor  [Atorvastatin ] Muscle Pain     SOB, Headache, fatigue, sick on my stomach

## 2024-12-25 NOTE — Progress Notes (Signed)
 [facility] Delivery Pharm[facility] Refill Coordination Note    Specialty Medication(s) to be Shipped:   Cardiology: Vyndamax     Other medication(s) to be shipped: No additional medications requested for fill at this time    Specialty Medications not needed at this time: N/A     Barbara Huber, DOB: [birth year]  Phone: [phone] (home)       All above HIPAA information was verified with patient's family member, Spoke to Ms [name] today.     Was a nurse, learning disability used for this call? No    Completed refill call assessment today to schedule patient's medication shipment from the [facility]  [phone]).  All relevant notes have been reviewed.     Specialty medication(s) and dose(s) confirmed: Regimen is correct and unchanged.   Changes to medications: Naquita reports no changes at this time.  Changes to insurance: No  New side effects reported not previously addressed with a pharmacist or physician: None reported  Questions for the pharmacist: No    Confirmed patient received a Conservation Officer, Historic Buildings and a Surveyor, Mining with first shipment. The patient will receive a drug information handout for each medication shipped and additional FDA Medication Guides as required.       DISEASE/MEDICATION-SPECIFIC INFORMATION        N/A    SPECIALTY MEDICATION ADHERENCE     Medication Adherence    Patient reported X missed doses in the last month: 0  Specialty Medication: Vyndamax  61mg   Patient is on additional specialty medications: No              Were doses missed due to medication being on hold? No    Vyndamax  61 mg: 10 days of medicine on hand       Specialty medication is an injection or given on a cycle: No    REFERRAL TO PHARMACIST     Referral to the pharmacist: Not needed      Barbara Huber     Shipping address confirmed in Epic.     Cost and Payment: Patient has a $0 copay, payment information is not required.    Delivery Scheduled: Yes, Expected medication delivery date: 12/28/24. Medication will be delivered via Next Day Courier to the prescription address in Epic WAM.    Barbara Huber, [facility]   [facility]  Specialty Pharmacist

## 2024-12-25 NOTE — Patient Instructions (Addendum)
-   Increase torsemide  to 100mg  in the morning, 60mg  in the afternoon  - If weight is not decreasing, take metolazone  x 1 dose tomorrow or Wednesday morning  - If weight gets to 166lbs, we can re-visit whether to continue torsemide  at 160mg  a day or reduce to 140mg  a day (message me!)

## 2024-12-26 DIAGNOSIS — Z09 Encounter for follow-up examination after completed treatment for conditions other than malignant neoplasm: Principal | ICD-10-CM

## 2024-12-27 DIAGNOSIS — J441 Chronic obstructive pulmonary disease with (acute) exacerbation: Principal | ICD-10-CM

## 2024-12-27 MED ORDER — AMOXICILLIN 500 MG-POTASSIUM CLAVULANATE 125 MG TABLET
ORAL_TABLET | Freq: Two times a day (BID) | ORAL | 0 refills | 5.00000 days | Status: CP
Start: 2024-12-27 — End: 2025-01-01

## 2024-12-27 MED FILL — VYNDAMAX 61 MG CAPSULE: ORAL | 30 days supply | Qty: 30 | Fill #1

## 2024-12-29 MED ORDER — FLUCONAZOLE 150 MG TABLET
ORAL_TABLET | Freq: Once | ORAL | 0 refills | 0.00000 days | Status: CP | PRN
Start: 2024-12-29 — End: ?

## 2024-12-29 NOTE — Addendum Note (Signed)
 Addended by: KIMEL-SCOTT, Devontay Celaya E on: 12/29/2024 02:30 PM     Modules accepted: Orders

## 2024-12-30 MED ORDER — MIDODRINE 5 MG TABLET
ORAL_TABLET | Freq: Three times a day (TID) | ORAL | 3 refills | 30.00000 days
Start: 2024-12-30 — End: 2025-12-30

## 2024-12-31 NOTE — Telephone Encounter (Signed)
 We [facility] health ) received the referral you have placed for this patient, unfortunately we are out of network with this patient's insurance.  I have listed some agencies below that may be in network . Aaron Aas    Providers:  [facility]  [phone]      [facility]  [phone]      [facility]  [phone]      [facility]  [phone]      [facility]  [phone]          [facility]  [phone]      [facility]   [phone]     [facility]- [phone]

## 2025-01-01 MED ORDER — MIDODRINE 5 MG TABLET
ORAL_TABLET | Freq: Three times a day (TID) | ORAL | 3 refills | 30.00000 days | Status: CP
Start: 2025-01-01 — End: 2026-01-01

## 2025-01-01 NOTE — Telephone Encounter (Signed)
 Refill request received for patient.      Medication Requested: Midodrine   Last Office Visit: 12/29/2024   Next Office Visit: 01/26/2025  Last Prescriber: Damien Lee NP    Nurse refill requirements met? Yes  If not met, why: n/a    Sent to: Pharmacy per protocol  If sent to provider, which provider?: n/a

## 2025-01-06 ENCOUNTER — Emergency Department
Admit: 2025-01-06 | Discharge: 2025-01-07 | Disposition: A | Discharge status: Home / Self Care | Payer: Medicare (Managed Care) | Attending: Student in an Organized Health Care Education/Training Program

## 2025-01-06 DIAGNOSIS — J189 Pneumonia, unspecified organism: Principal | ICD-10-CM

## 2025-01-06 LAB — CBC W/ AUTO DIFF
BASOPHILS ABSOLUTE COUNT: 0.1 10*9/L (ref 0.0–0.1)
BASOPHILS RELATIVE PERCENT: 0.8 %
EOSINOPHILS ABSOLUTE COUNT: 0 10*9/L (ref 0.0–0.5)
EOSINOPHILS RELATIVE PERCENT: 0.3 %
HEMATOCRIT: 36.1 % (ref 34.0–44.0)
HEMOGLOBIN: 11.7 g/dL (ref 11.3–14.9)
LYMPHOCYTES ABSOLUTE COUNT: 0.7 10*9/L — ABNORMAL LOW (ref 1.1–3.6)
LYMPHOCYTES RELATIVE PERCENT: 8.4 %
MEAN CORPUSCULAR HEMOGLOBIN CONC: 32.4 g/dL (ref 32.0–36.0)
MEAN CORPUSCULAR HEMOGLOBIN: 30.4 pg (ref 25.9–32.4)
MEAN CORPUSCULAR VOLUME: 93.9 fL (ref 77.6–95.7)
MEAN PLATELET VOLUME: 9 fL (ref 6.8–10.7)
MONOCYTES ABSOLUTE COUNT: 0.8 10*9/L (ref 0.3–0.8)
MONOCYTES RELATIVE PERCENT: 9.1 %
NEUTROPHILS ABSOLUTE COUNT: 6.8 10*9/L (ref 1.8–7.8)
NEUTROPHILS RELATIVE PERCENT: 81.4 %
PLATELET COUNT: 178 10*9/L (ref 150–450)
RED BLOOD CELL COUNT: 3.84 10*12/L — ABNORMAL LOW (ref 3.95–5.13)
RED CELL DISTRIBUTION WIDTH: 13.9 % (ref 12.2–15.2)
WBC ADJUSTED: 8.4 10*9/L (ref 3.6–11.2)

## 2025-01-06 LAB — BASIC METABOLIC PANEL
ANION GAP: 11 mmol/L (ref 5–14)
BLOOD UREA NITROGEN: 44 mg/dL — ABNORMAL HIGH (ref 9–23)
BUN / CREAT RATIO: 24
CALCIUM: 8.9 mg/dL (ref 8.7–10.4)
CHLORIDE: 105 mmol/L (ref 98–107)
CO2: 21 mmol/L (ref 20.0–31.0)
CREATININE: 1.8 mg/dL — ABNORMAL HIGH (ref 0.55–1.02)
EGFR CKD-EPI (2021) FEMALE: 28 mL/min/1.73m2 — ABNORMAL LOW (ref >=60)
GLUCOSE RANDOM: 96 mg/dL (ref 70–179)
SODIUM: 137 mmol/L (ref 135–145)

## 2025-01-06 LAB — MAGNESIUM: MAGNESIUM: 2.1 mg/dL (ref 1.6–2.6)

## 2025-01-06 LAB — POTASSIUM: POTASSIUM: 3.2 mmol/L — ABNORMAL LOW (ref 3.4–4.8)

## 2025-01-06 LAB — PRO-BNP: PRO-BNP: 7382 pg/mL — ABNORMAL HIGH (ref <=300.0)

## 2025-01-06 MED ORDER — AMOXICILLIN 500 MG-POTASSIUM CLAVULANATE 125 MG TABLET
ORAL_TABLET | Freq: Two times a day (BID) | ORAL | 0 refills | 7.00000 days | Status: CP
Start: 2025-01-06 — End: 2025-01-13

## 2025-01-06 NOTE — ED Provider Notes (Signed)
 84 year old female with PMH of [facility]  Emergency Department Provider Note     ED Clinical Impression     Final diagnoses:   Pneumonia of right lung due to infectious organism, unspecified part of lung (Primary)       Medical Decision Making, ED Course     BP 128/69  - Pulse 87  - Temp 37.9 ??C (100.2 ??F) (Oral)  - Resp 20  - Wt 77.1 kg (170 lb)  - LMP  (LMP Unknown)  - SpO2 96%  - BMI 27.44 kg/m??     Initial Clinical Impression/DDX/Medical Decision Making  7-yo F with PMH [name] (amyloid), COPD, pHTN, [name]4, afib presents with acute-on-chronic cough worsened over the past day with generalized weakness.On presentation patient has temp elevated to 100.74F, is satting 99% on baseline 3L and weighs 170 (up 4 pounds from dry weight). Exam notable for faint end-expiratory wheezing and rhonchi in bilateral lower lobes.    Patient was admitted last month after presenting for dyspnea.  VQ scan found low probability of PE, she was thought to be overall fluid overloaded and started on diuresis, dry weight 166.    Suspect patient's symptoms are secondary to COPD exacerbation iso viral URI vs pneumonia. RPP notable for +coronavirus NL63. CXR on my read finds consolidation of the right mid-lower lobe and overall is suggestive of pulmonary edema when compared to prior vs atypical pneumonia. Will get basic labs to assess renal function plus BNP. Will give Unasyn  for her lobar pneumonia and Duo-Nebs.    ED Course:  ED Course as of 01/07/25 1828   Sat Jan 06, 2025   2030 XR Chest 2 views  Persistent prominence of the interstitial markings, a nonspecific finding as can be seen with interstitial pulmonary edema and/or small airway inflammation. Hazy bibasilar opacification may be related, however the possibility of concomitant infection can not be excluded.   2054 Creatinine(!): 1.80  Significant improvement from the last 3 priors which were around 2.5   2054 Magnesium : 2.1   2204 PRO-BNP(!): 7,382.0  BNP elevated but appears similar to baseline.   2204 Potassium(!): 3.2  Ordering for repletion.   2213 Patient ambulated briefly, felt unsteady on her feet but this is her baseline per her and her daughter.  I discussed results of the workup so far and plan to discharge her on Augmentin .  They agreed to follow-up with her outpatient provider and requested that I send the antibiotics to the [facility] in [place].  She remained stable and well-appearing at time of discharge.            HPI, Physical Exam     HPI:  January 06, 2025 7:16 PM   History of Present Illness  Barbara Huber is an 84 year old female with COPD who presents with cough and leg weakness.    She has had a persistent cough for the past three weeks, which began after leaving [facility]. She completed a five-day course of amoxicillin  approximately one week ago, but the cough persists and acutely worsened in the past two days.     Leg weakness started this week, accompanied by fatigue beginning yesterday. She denies any increase in her baseline oxygen  requirement, which is three liters, and has not needed additional breathing treatments or inhalers beyond her usual regimen. She has a history of COPD exacerbations but is uncertain if her current symptoms resemble past exacerbations.    Her fluid balance is a concern due to a previous admission for difficulty  breathing, after which she was discharged with diuretics to manage fluid retention. Her current weight is slightly above her dry weight, but she reports a weight of 164 pounds this morning, which is within her target range. She continues to take Lasix  as part of her management plan.    In terms of nutrition, she mentions eating a lot of ice, which she has not done in the past. She is otherwise managing her fluid intake carefully due to her history of heart disease and amyloidosis, which complicates her diuretic use.    Physical Exam:   Constitutional: Alert and oriented. No acute distress.  Nontoxic-appearing  HEENT: Normocephalic and atraumatic. Conjunctivae clear. No congestion. Moist mucous membranes.   CV: Rate as above, rhythm regular.  Extremities warm.  Pulm: Normal work of breathing, no respiratory distress.  Faint end-expiratory wheezing in the bilateral lower lobes with rhonchi noted in the right lower lobe  GI: Non-distended  MSK: Moving extremities equally  Neuro: Normal speech and language. Patient is moving all extremities equally, face is symmetric at rest and with speech.  Skin: Skin is warm, dry and intact. No rash noted.    Vitals:    01/06/25 1930 01/06/25 1945 01/06/25 2130 01/06/25 2207   BP: 129/77 124/81  128/69   Pulse: 83 77 80 87   Resp: 14 16 17 20    Temp:       TempSrc:       SpO2: 100% 99% 90% 96%   Weight:             Discussion of Management with other Physicians, QHP or Appropriate Source: If applicable, as documented in ED course above  Independent Interpretation of Studies: If applicable, documented in ED course above.  I have reviewed recent and relevant previous record, including: If applicable, inpatient/outpatient notes and prior studies, documented in Impression/MDM          ____________________________________________    The case was discussed with the attending physician, who is in agreement with the above assessment and plan.     Additional History Elements     Chief Complaint  Chief Complaint   Patient presents with    Cough    Shortness of Breath         Past Medical History[1]    Past Surgical History[2]    Allergies  Nitrofurantoin and Lipitor  [atorvastatin ]    Family History  Family History[3]    Social History  Short Social History[4]     Radiology     XR Chest 2 views   Final Result   Persistent prominence of the interstitial markings, a nonspecific finding as can be seen with interstitial pulmonary edema and/or small airway inflammation. Hazy bibasilar opacification may be related, however the possibility of concomitant infection can not be excluded.         Continued radiographic follow-up is advised          Pertinent labs & imaging results that were available during my care of the patient were independently interpreted by me and considered in my medical decision making (see chart for details).    Portions of this record have been created using Scientist, clinical (histocompatibility and immunogenetics). Dictation errors have been sought, but may not have been identified and corrected.         [1]   Past Medical History:  Diagnosis Date    Acute exacerbation of CHF (congestive heart failure) (CMS-HCC) 08/19/2023    Acute kidney injury superimposed on chronic kidney disease 10/11/2020  Acute on chronic diastolic (congestive) heart failure (CMS-HCC) 08/23/2020    Acute on chronic diastolic congestive heart failure (CMS-HCC) 08/23/2020    AKI (acute kidney injury) 04/14/2015    Lab Results  Component  Value  Date     CREATININE  1.90 (H)  06/12/2021     Had a bump in her creatinine when she was taking her diuretics every day.  She is currently taking 40 mg daily of torsemide  and 50 mg of spironolactone .  Her volume status is fragile.  Previously when she stopped her diuretic she becomes short of breath.  Plan: We will check her BMP today.  We will likely have to go to 40    Anemia 08/22/2019    Iron  deficiency anemia          Lab Results      Component    Value    Date           WBC    9.2    03/21/2021           RBC    3.67 (L)    03/21/2021           HGB    9.9 (L)    03/21/2021           HCT    30.7 (L)    03/21/2021           MCV    83.7    03/21/2021           MCH    26.9    03/21/2021           MCHC    32.1    03/21/2021           RDW    21.9 (H)    03/21/2021           PLT    318     Arthritis     Calculus of kidney     Calculus of ureter     CHF (congestive heart failure) (CMS-HCC)     Chronic atrial fibrillation    (CMS-HCC) 07/20/2019    COPD (chronic obstructive pulmonary disease) (CMS-HCC)     Diabetes (CMS-HCC)     Gangrenous cholecystitis 10/11/2020    Generalized edema  06/17/2021    GERD (gastroesophageal reflux disease)     Hydronephrosis     Hypertension     Hyponatremia 10/11/2020    Intermediate coronary syndrome (CMS-HCC) 03/13/2014    Lower extremity edema 09/28/2020    Lumbar stenosis     Microscopic hematuria     Nausea alone     Nephrolithiasis 04/17/2016    Neuropathy     Nocturia     Nocturia 07/01/2017    Other chronic cystitis     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  See plans for anemia, AKI  We are also tapering her gabapentin .  Currently on 300 mg 3 times daily.  We will decrease it to twice daily, and then nightly.  She states she is not having any recurrence of her pain.    Pulmonary hypertension    (CMS-HCC)     Renal colic     Shortness of breath 04/28/2020    Sleep apnea     Unstable angina pectoris    (CMS-HCC) 03/13/2014   [2]   Past Surgical History:  Procedure Laterality Date    BACK SURGERY  1995    CARPAL TUNNEL  RELEASE Left 2014    HYSTERECTOMY  1971    IR INSERT CHOLECYSTOSTOMY TUBE PERCUTANEOUS  10/02/2020    IR INSERT CHOLECYSTOSMY TUBE PERCUTANEOUS 10/02/2020 Ivonne Butler Seed, MD IMG VIR H&V [facility]    LUMBAR DISC SURGERY      PR REMOVAL GALLBLADDER N/A 10/06/2020    Procedure: CHOLECYSTECTOMY;  Surgeon: Curtistine Mira Dunnings, MD;  Location: MAIN OR [facility];  Service: Trauma    PR RIGHT HEART CATH O2 SATURATION & CARDIAC OUTPUT N/A 09/30/2020    Procedure: Right Heart Catheterization;  Surgeon: Reyes Jerilynn Sage, MD;  Location: [facility] CATH;  Service: Cardiology    PR RIGHT HEART CATH O2 SATURATION & CARDIAC OUTPUT N/A 01/09/2021    Procedure: Right Heart Catheterization;  Surgeon: Norleen Deward Grumbling, MD;  Location: [facility] CATH;  Service: Cardiology   [3]   Family History  Problem Relation Age of Onset    Hypertension Mother     Cancer Father         COLON CANCER    No Known Problems Sister     No Known Problems Sister     No Known Problems Brother     No Known Problems Brother     No Known Problems Maternal Aunt     No Known Problems Maternal Uncle     No Known Problems Paternal Aunt     No Known Problems Paternal Uncle     No Known Problems Maternal Grandmother     No Known Problems Maternal Grandfather     No Known Problems Paternal Grandmother     No Known Problems Paternal Grandfather     No Known Problems Other     Anesthesia problems Neg Hx     Broken bones Neg Hx     Clotting disorder Neg Hx     Collagen disease Neg Hx     Diabetes Neg Hx     Dislocations Neg Hx     Fibromyalgia Neg Hx     Gout Neg Hx     Hemophilia Neg Hx     Osteoporosis Neg Hx     Rheumatologic disease Neg Hx     Scoliosis Neg Hx     Severe sprains Neg Hx     Sickle cell anemia Neg Hx     Spinal Compression Fracture Neg Hx     GU problems Neg Hx     Kidney cancer Neg Hx     Prostate cancer Neg Hx    [4]   Social History  Tobacco Use    Smoking status: Former     Current packs/day: 0.00     Average packs/day: 0.3 packs/day     Types: Cigarettes     Quit date: 2019     Years since quitting: 7.0    Smokeless tobacco: Never    Tobacco comments:     Quit a few years ago   Vaping Use    Vaping status: Never Used   Substance Use Topics    Alcohol use: No    Drug use: No        Audry Monico CROME, MD  Resident  01/07/25 [phone]

## 2025-01-06 NOTE — ED Triage Note (Signed)
 Pt c/o ongoing cough and SOB, worsening over past day. 3L [place] baseline, has not needed to increase oxygen . Endorsing weakness and shakiness, per pt is worse on R side, RN assessed equal strength dorsiflexion and plantar flexion. No slurred speech, no drift.

## 2025-01-06 NOTE — ED Triage Note (Signed)
 No fevers at home, 100.51f here. BP softer (104/54 (69)). Cough since discharge from hospital a few weeks ago.

## 2025-01-07 MED ORDER — DOXYCYCLINE HYCLATE 100 MG CAPSULE
ORAL_CAPSULE | Freq: Two times a day (BID) | ORAL | 0 refills | 5.00000 days | Status: CP
Start: 2025-01-07 — End: 2025-01-12

## 2025-01-07 MED ADMIN — ampicillin-sulbactam (UNASYN) 3 g in sodium chloride 0.9 % (NS) 100 mL IVPB-MBP: 3 g | INTRAVENOUS | @ 02:00:00 | Stop: 2025-01-06

## 2025-01-07 MED ADMIN — potassium chloride ER tablet 20 mEq: 20 meq | ORAL | @ 03:00:00 | Stop: 2025-01-06

## 2025-01-07 MED ADMIN — acetaminophen (TYLENOL) tablet 650 mg: 650 mg | ORAL | @ 02:00:00 | Stop: 2025-01-06

## 2025-01-07 MED ADMIN — ipratropium-albuterol (DUO-NEB) 0.5-2.5 mg/3 mL nebulizer solution 3 mL: 3 mL | RESPIRATORY_TRACT | @ 01:00:00 | Stop: 2025-01-06

## 2025-01-07 NOTE — Progress Notes (Signed)
 Internal Medicine Clinic Visit  Person Contacted: Patient  Contact Phone number: [phone] (home)   Is there someone else in the room? Yes. What is your relationship? Daughter . Do you want this person here for the visit? yes.  Reason for visit: follow up/cough    A/P:  Assessment & Plan  Pneumonia  Suspected pneumonia with persistent cough and white sputum. Differential includes CHF, pneumonia (viral or bacterial), and COPD, favoring infectious etiology. She may also have post viral cough, given coronovirus (not 19) pos. Wt stable, pro BNP down. She will take Doxycycline  prescribed- Continue doxycycline  with food and water .  - Ordered sputum culture.  - Prescribed Hycodan for nighttime cough.  -consider chest CT in future  -consider increased diuretics in future if no improvememnt    Chronic obstructive pulmonary disease (COPD)  COPD exacerbation in differential with  increased cough. Discussed potential prednisone  course; patient hesitant.  - Continue current inhaler regimen.  -consider pred in future      Chronic kidney disease, stage IV  [name] stage IV with improved kidney function. Creatinine at 1.8, GFR 28. No diuretic changes to avoid kidney strain.  - Continue to monitor kidney function regularly.    Return in about 4 weeks (around 02/05/2025).    HPI:   Pt is a 84 y.o. female with a history of MH [name] (amyloid), COPD, pHTN, [name]4, afib   -Was in ED Sat for? Pneumonia. Got a dose of unasyn , sent home on doxy.   Lab Results   Component Value Date    CREATININE 1.80 (H) 01/06/2025     Lab Results   Component Value Date    A1C 4.6 (L) 09/14/2024     Lab Results   Component Value Date    TSH 5.265 (H) 12/18/2024         History of Present Illness  Barbara Huber is an 84 year old female with COPD on oxygen , heart failure with preserved ejection fraction from amyloid, and [name] who presents with a persistent cough and recent pneumonia. She is accompanied by her daughter, Holly.    She was seen in the emergency room two days ago for pneumonia, where she received a dose of Unasyn  and was started on doxycycline . A chest x-ray on January 10th showed persistent prominence of interstitial markings and hazy basilar opacification. Laboratory results from January 10th showed a potassium level of 3.2, a pro-BNP of 7382 (decreased from 11957 on December 29th), magnesium  at 2.1, and creatinine improved to 1.8 with a GFR of 28, compared to a previous GFR of 25.    She has a persistent cough that began before her recent hospital discharge around Christmas and worsened, leading to the emergency room visit. The cough is sometimes productive with white or clear sputum. She was initially prescribed Augmentin , but after completing that course, she was switched to doxycycline  due to persistent symptoms.    She is currently taking torsemide  160 mg daily and metolazone , and her weight is stable at 64 pounds, which is her baseline. She uses her inhalers regularly. Her daughter notes that she was fatigued and not feeling well on Saturday, prompting the emergency room visit.      Problem List:  Problem List[1]    Medications:  Reviewed in EPIC    Social history:   Lives with daughter    Physical Exam:   Vital Signs:  BP Readings from Last 3 Encounters:   01/06/25 128/69   12/25/24 101/68   12/23/24 107/53  Wt Readings from Last 3 Encounters:   01/08/25 74.4 kg (164 lb)   01/06/25 77.1 kg (170 lb)   12/25/24 77.1 kg (170 lb)      General appearance -   On oxygen , no dyspnea, no coughing    Records review  Lab Results   Component Value Date    A1C 4.6 (L) 09/14/2024        Health Maintenance Due   Topic Date Due    DEXA Scan  Never done    Retinal Eye Exam  Never done    DTaP/Tdap/Td Vaccines (1 - Tdap) Never done    Medicare Annual Wellness Visit (AWV)  11/25/2018    Foot Exam  02/04/2022    COVID-19 Vaccine (5 - 2025-26 season) 08/28/2024      The ASCVD Risk score (Arnett DK, et al., 2019) failed to calculate.     Medication adherence and barriers to the treatment plan have been addressed. Opportunities to optimize healthy behaviors have been discussed. Patient / caregiver voiced understanding.      The patient reports they are physically located in [place]  and is currently: at home. I conducted a audio/video visit. I spent  61m 46s on the video call with the patient. I spent an additional 16 minutes on pre- and post-visit activities on the date of service .             [1]   Patient Active Problem List  Diagnosis    Spinal stenosis of lumbar region    Thoracic or lumbosacral neuritis or radiculitis    Lumbosacral spondylosis    COPD (chronic obstructive pulmonary disease) (CMS-HCC)    Sleep apnea in adult    Essential hypertension    Type 2 diabetes mellitus with stage 4 chronic kidney disease, with long-term current use of insulin  (CMS-HCC)    Chronic cystitis    Incomplete bladder emptying    Peripheral neuropathy    Chronic respiratory failure with hypoxia    (CMS-HCC)    Atrial fibrillation    (CMS-HCC)    Anemia in chronic kidney disease    Chronic kidney disease ([name]), stage IV (severe)    (CMS-HCC)    Pulmonary hypertension    (CMS-HCC)    Shortness of breath    Chronic prescription opiate use    Bronchitis    Chronic heart failure with preserved ejection fraction ([name]) (CMS-HCC)    Cardiac amyloidosis (CMS-HCC)    Recurrent cold sores    Dyspepsia    Iron  deficiency anemia due to chronic blood loss    Neuropathy    Left hip pain    Familial amyloid heart disease (CMS-HCC)    Hereditary amyloidosis    (CMS-HCC)    Osteoporosis with current pathological fracture    Troponin level elevated    Thrombocytopenia    Difficulty walking    Pneumonia due to infectious organism    Dyspnea    Acute on chronic hypoxic respiratory failure    (CMS-HCC)    Leukocytosis    Subacute cough    Leg weakness, bilateral

## 2025-01-07 NOTE — ED Progress Note (Signed)
 Emergency Medicine Access Physician [facility]) Note    January 07, 2025 3:49 PM    Barbara Huber's daughter, Barbara Huber, called the ED today regarding her [name] on 01/06/25. The patient was diagnosed with pneumonia and prescribed a course of Augmentin . The patient completed a 5-day course of Augmentin  on 1/5 and [name] is asking whether a different antibiotic might be more appropriate. I discussed the case with the treating physician, Dr. Augustin, who requested the Augmentin  prescribed at the time of discharge be changed to doxycycline . I have electronically prescribed this medication to her pharmacy of choice. She will discontinue the Augmentin  and begin the doxycycline  tonight.

## 2025-01-08 DIAGNOSIS — R059 Cough, unspecified type: Principal | ICD-10-CM

## 2025-01-08 DIAGNOSIS — I5032 Chronic diastolic (congestive) heart failure: Principal | ICD-10-CM

## 2025-01-08 DIAGNOSIS — J42 Unspecified chronic bronchitis: Principal | ICD-10-CM

## 2025-01-08 DIAGNOSIS — N184 Chronic kidney disease, stage 4 (severe): Principal | ICD-10-CM

## 2025-01-08 MED ORDER — HYDROCODONE 10 MG-CHLORPHENIRAMINE 8 MG/5 ML ORAL SUSP EXTEND.REL 12HR
Freq: Two times a day (BID) | ORAL | 0 refills | 12.00000 days | Status: CP | PRN
Start: 2025-01-08 — End: ?

## 2025-01-09 ENCOUNTER — Ambulatory Visit: Admit: 2025-01-09 | Discharge: 2025-01-10 | Payer: Medicare (Managed Care)

## 2025-01-11 MED ORDER — ERGOCALCIFEROL (VITAMIN D2) 1,250 MCG (50,000 UNIT) CAPSULE
ORAL_CAPSULE | ORAL | 0 refills | 0.00000 days
Start: 2025-01-11 — End: ?

## 2025-01-11 NOTE — Telephone Encounter (Signed)
 Duplicate message.

## 2025-01-12 MED ORDER — LEVOFLOXACIN 750 MG TABLET
ORAL_TABLET | ORAL | 0 refills | 8.00000 days | Status: CP
Start: 2025-01-12 — End: ?

## 2025-01-12 MED ORDER — ERGOCALCIFEROL (VITAMIN D2) 1,250 MCG (50,000 UNIT) CAPSULE
ORAL_CAPSULE | ORAL | 0 refills | 84.00000 days
Start: 2025-01-12 — End: ?

## 2025-01-12 NOTE — Telephone Encounter (Signed)
 Pt can take 2000 units daily

## 2025-01-16 MED ORDER — MINOCYCLINE 100 MG CAPSULE
ORAL_CAPSULE | Freq: Two times a day (BID) | ORAL | 0 refills | 5.00000 days | Status: CP
Start: 2025-01-16 — End: 2025-04-16

## 2025-01-20 MED ORDER — ADVAIR HFA 115 MCG-21 MCG/ACTUATION AEROSOL INHALER
0 refills | 0.00000 days
Start: 2025-01-20 — End: ?

## 2025-01-22 MED ORDER — ADVAIR HFA 115 MCG-21 MCG/ACTUATION AEROSOL INHALER
RESPIRATORY_TRACT | 1 refills | 0.00000 days | Status: CP
Start: 2025-01-22 — End: ?

## 2025-01-22 MED ORDER — AMIODARONE 200 MG TABLET
ORAL_TABLET | Freq: Every day | ORAL | 1 refills | 30.00000 days | Status: CP
Start: 2025-01-22 — End: ?

## 2025-01-22 MED ORDER — FLUTICASONE PROPIONATE 115 MCG-SALMETEROL 21 MCG/ACTUATION HFA INHALER
1 refills | 0.00000 days
Start: 2025-01-22 — End: ?

## 2025-01-22 NOTE — Telephone Encounter (Signed)
 Refill request received for patient.      Medication Requested: amiodarone  200 mg tab   Last Office Visit: 12/25/2024   Next Office Visit: 01/26/2025  Last Prescriber: [name] Damien BODILY    Nurse refill requirements met? Yes  If not met, why:     Sent to: Pharmacy per protocol  If sent to provider, which provider?:

## 2025-01-23 LAB — CBC W/ AUTO DIFF
BASOPHILS ABSOLUTE COUNT: 0.1 10*9/L (ref 0.0–0.1)
BASOPHILS RELATIVE PERCENT: 0.7 %
EOSINOPHILS ABSOLUTE COUNT: 0.1 10*9/L (ref 0.0–0.5)
EOSINOPHILS RELATIVE PERCENT: 0.7 %
HEMATOCRIT: 36.1 % (ref 34.0–44.0)
HEMOGLOBIN: 12 g/dL (ref 11.3–14.9)
LYMPHOCYTES ABSOLUTE COUNT: 0.7 10*9/L — ABNORMAL LOW (ref 1.1–3.6)
LYMPHOCYTES RELATIVE PERCENT: 6.6 %
MEAN CORPUSCULAR HEMOGLOBIN CONC: 33.3 g/dL (ref 32.0–36.0)
MEAN CORPUSCULAR HEMOGLOBIN: 30.6 pg (ref 25.9–32.4)
MEAN CORPUSCULAR VOLUME: 92.1 fL (ref 77.6–95.7)
MEAN PLATELET VOLUME: 9.5 fL (ref 6.8–10.7)
MONOCYTES ABSOLUTE COUNT: 1 10*9/L — ABNORMAL HIGH (ref 0.3–0.8)
MONOCYTES RELATIVE PERCENT: 9.1 %
NEUTROPHILS ABSOLUTE COUNT: 9.3 10*9/L — ABNORMAL HIGH (ref 1.8–7.8)
NEUTROPHILS RELATIVE PERCENT: 82.9 %
PLATELET COUNT: 156 10*9/L (ref 150–450)
RED BLOOD CELL COUNT: 3.92 10*12/L — ABNORMAL LOW (ref 3.95–5.13)
RED CELL DISTRIBUTION WIDTH: 14.3 % (ref 12.2–15.2)
WBC ADJUSTED: 11.3 10*9/L — ABNORMAL HIGH (ref 3.6–11.2)

## 2025-01-23 LAB — MAGNESIUM: MAGNESIUM: 2.5 mg/dL (ref 1.6–2.6)

## 2025-01-23 LAB — COMPREHENSIVE METABOLIC PANEL
ALBUMIN: 3.2 g/dL — ABNORMAL LOW (ref 3.4–5.0)
ALKALINE PHOSPHATASE: 114 U/L (ref 46–116)
ALT (SGPT): <7 U/L — ABNORMAL LOW (ref 10–49)
ANION GAP: 12 mmol/L (ref 5–14)
AST (SGOT): 17 U/L (ref <=34)
BILIRUBIN TOTAL: 0.4 mg/dL (ref 0.3–1.2)
BLOOD UREA NITROGEN: 77 mg/dL — ABNORMAL HIGH (ref 9–23)
BUN / CREAT RATIO: 29
CALCIUM: 9.1 mg/dL (ref 8.7–10.4)
CHLORIDE: 99 mmol/L (ref 98–107)
CO2: 24 mmol/L (ref 20.0–31.0)
CREATININE: 2.64 mg/dL — ABNORMAL HIGH (ref 0.55–1.02)
EGFR CKD-EPI (2021) FEMALE: 17 mL/min/{1.73_m2} — ABNORMAL LOW (ref >=60)
GLUCOSE RANDOM: 112 mg/dL (ref 70–179)
POTASSIUM: 5 mmol/L — ABNORMAL HIGH (ref 3.4–4.8)
PROTEIN TOTAL: 7.8 g/dL (ref 5.7–8.2)
SODIUM: 135 mmol/L (ref 135–145)

## 2025-01-23 LAB — HIGH SENSITIVITY TROPONIN I - SERIAL: HIGH SENSITIVITY TROPONIN I: 59 ng/L (ref <=34)

## 2025-01-23 LAB — PRO-BNP: PRO-BNP: 5070 pg/mL — ABNORMAL HIGH (ref <=300.0)

## 2025-01-23 LAB — TSH: THYROID STIMULATING HORMONE: 4.926 u[IU]/mL — ABNORMAL HIGH (ref 0.550–4.780)

## 2025-01-23 LAB — LIPASE: LIPASE: 37 U/L (ref 12–53)

## 2025-01-23 NOTE — ED Progress Note (Signed)
 Received sign out from previous provider. For further history please see their note.    ED I-PASS Handoff  Illness Severit: stable  Patient Summary: Barbara Huber is a 84 y.o. female here with generalized weakness.  She has a history of [name] and is on diuretics.  She has lost some weight recently and her daughter believes she may be dehydrated.  She recently also had a viral illness.  Her neurological exam is nonfocal.  Workup thus far as below.  Action List:   Follow-up lab testing and chest x-ray  I performed synthesis as the receiver.      IPASS summary and updates:  ED Course as of 01/25/25 1821   Tue Jan 23, 2025   2315 Hi, I'd like to admit Barbara Huber MRN [id number] in the ED for generalized weakness and an AKI, complicated by diuresis and [name]. Thanks, Vannie    Paged MAO     Wed Jan 24, 2025   0112 FEUrea is 30.1% which suggest prerenal disease.  Will give slight dose of fluids given most likely concern is dehydration       Discussed with the team.  Patient admitted.          Final diagnoses:   None       Vitals:    01/23/25 2126 01/23/25 2128 01/23/25 2130 01/23/25 2254   BP: 146/85      Pulse: 75 74 74 75   Resp: 16 16 15 16    Temp:    37.1 ??C (98.8 ??F)   SpO2:  100% 100% 100%       XR Chest 1 view Portable    (Results Pending)       Lab Results   Component Value Date    WBC 11.3 (H) 01/23/2025    HGB 12.0 01/23/2025    HCT 36.1 01/23/2025    PLT 156 01/23/2025       Lab Results   Component Value Date    NA 135 01/23/2025    K 5.0 (H) 01/23/2025    CL 99 01/23/2025    CO2 24.0 01/23/2025    BUN 77 (H) 01/23/2025    CREATININE 2.64 (H) 01/23/2025    GLU 112 01/23/2025    CALCIUM  9.1 01/23/2025    MG 2.5 01/23/2025    PHOS 3.6 12/23/2024       Lab Results   Component Value Date    BILITOT 0.4 01/23/2025    BILIDIR 0.20 07/30/2024    PROT 7.8 01/23/2025    ALBUMIN  3.2 (L) 01/23/2025    ALT <7 (L) 01/23/2025    AST 17 01/23/2025    ALKPHOS 114 01/23/2025    GGT 215 (H) 09/29/2020       Lab Results Component Value Date    INR 1.04 10/18/2024    APTT 42.4 (H) 07/03/2024

## 2025-01-23 NOTE — ED Triage Note (Signed)
 Weakness that started today.

## 2025-01-23 NOTE — ED Provider Notes (Signed)
 [facility]  Emergency Department Provider Note    ED Clinical Impression     Final diagnoses:   Generalized weakness (Primary)       HPI, ED Course, Assessment and Plan     Initial Clinical Impression:    January 23, 2025 9:49 PM   Barbara Huber is a 84 y.o. female with past medical history of COPD, chronic respiratory failure w/ hypoxia (on 3 L [place] at baseline), OSA, hereditary amyloidosis, [name], A-fib, HTN, [name] stage IV, and T2DM presenting for weakness.     Per daughter at bedside, the patient has experienced progressive BLE weakness for the past 1-2 weeks, which acutely worsened tonight. The patient reports that her legs gave out on her and that she was unable to stand or use her rollator. Her daughter also reports that she was noting worsened from baseline SOB earlier today. Furthermore, she notes that the patient has been fatigued and has had a poor appetite lately, stating that she's lost 7 lbs in the past month. Her daughter states that they've cutdown on her diuretic ( 160 mg to 140 mg) out of concern for her weight loss.      Of note, this occurs in the setting of recent pneumonia and +coronavirus on the 10th. The patient endorses having a lingering cough which is improving.  The patient's daughter notes that the patient has had very poor oral intake over the last several days.    Her daughter reports that the patient has had similar episodes of weakness over the past month. She notes that the patient has a history of CHF exacerbations. The patient is on 3 L [place] at baseline. She currently lives at home with her daughter and son in-law.     She denies any recent falls, chest pain, nausea, vomiting, fevers, or unilateral weakness.     BP 107/78  - Pulse 76  - Temp 36.9 ??C (98.5 ??F) (Oral)  - Resp 16  - LMP  (LMP Unknown)  - SpO2 99%   Initial vital signs are within normal limits.   Pertinent physical exam: On my initial evaluation, the patient is nontoxic appearing, in no acute distress. Trace pitting edema to the bilateral ankles.  Symmetric but decreased 4-/5 strength to the lower extremities which are antigravity bilaterally.    Medical Decision Making  This is a 84 y.o. female with history as above presenting with overall weakness or fatigue, rather than any particular focal weakness. Glucose normal. EKG left bundle morphology, leftward axis without evidence of acute ischemia as interpreted by me. Intact neurologic exam and bilateral nature of symptoms make CNS etiology such as stroke, tumor unlikely.  Cardiac etiologies such as CHF exacerbation or ACS will be ruled out with EKG and troponin; anemia will be ruled out with CBC; infection will be ruled out with basic infectious workup (CXR with mild pulmonary edema without focal opacities, urinalysis unremarkable); acute electrolyte abnormalities ruled out with serum studies.  Could be reflective of overdiuresis given persistent weight loss per family, symptoms may be secondary to dehydration/poor nutritional intake.    Suspect acute kidney injury of prerenal origin. Doubt intrinsic renal dysfunction or obstructive nephropathy. Considered alternate etiologies of the patient???s symptoms including infectious processes, severe metabolic derangements or electrolyte abnormalities, ischemia/ACS, heart failure, and intracranial/central processes but think these are unlikely given the history, exam, and workup.  Given her 3 L oxygen  requirement at baseline and difficult fluid balance status do feel she will require admission.  Patient handed  off to oncoming provider pending this.    Further ED updates and updates to plan as per ED Course below:    ED Course:       MDM Elements  I have reviewed recent and relevant previous record, including: 01/06/25 [facility] ED Provider Note  Independent Interpretation of Studies: EKG(s) - see MDM and X-ray(s) - see MDM  Prescription drug(s) considered but not prescribed: Antibiotics - no clear bacterial illness present    Social Determinants that significantly affected care: N/A  Discussion of Management with other Physicians, QHP or Appropriate Source: Admitting team - pending at time of signout  Escalation of Care including OBS/Admission/Transfer was considered: Pending admission at time of signout  ____________________________________________    The case was discussed with the attending physician who is in agreement with the above assessment and plan.     Past History     PAST MEDICAL HISTORY/PAST SURGICAL HISTORY:   Past Medical History[1]    Past Surgical History[2]    MEDICATIONS:   No current facility-administered medications for this encounter.    Current Outpatient Medications:     acetaminophen  (TYLENOL ) 500 MG tablet, Take 2 tablets (1,000 mg total) by mouth Three (3) times a day as needed for pain., Disp: , Rfl:     albuterol  2.5 mg /3 mL (0.083 %) nebulizer solution, Inhale 3 mL by nebulization every six (6) hours as needed for wheezing., Disp: , Rfl:     albuterol  HFA 90 mcg/actuation inhaler, Inhale 1 puff every six (6) hours as needed for wheezing., Disp: 8 g, Rfl: 3    amiodarone  (PACERONE ) 200 MG tablet, Take 1 tablet (200 mg total) by mouth daily., Disp: 30 tablet, Rfl: 1    blood sugar diagnostic (ACCU-CHEK GUIDE TEST STRIPS) Strp, Use to check blood sugar daily, Disp: 200 strip, Rfl: 6    cranberry 500 mg cap, Take 500 mg by mouth daily with evening meal., Disp: , Rfl:     cyclobenzaprine  (FLEXERIL ) 10 MG tablet, Take 1 tablet (10 mg total) by mouth two (2) times a day as needed for muscle spasms., Disp: 60 tablet, Rfl: 11    ergocalciferol -1,250 mcg, 50,000 unit, (DRISDOL ) 1,250 mcg (50,000 unit) capsule, Take 1 capsule (1,250 mcg total) by mouth every fourteen (14) days., Disp: 6 capsule, Rfl: 1    ferrous sulfate  325 (65 FE) MG tablet, Take 1 tablet (325 mg total) by mouth in the morning., Disp: 90 tablet, Rfl: 0    fluconazole  (DIFLUCAN ) 150 MG tablet, Take 1 tablet (150 mg total) by mouth once as needed for up to 2 doses. Can take second dose if symptoms persist after 72hrs., Disp: 2 tablet, Rfl: 0    fluticasone  propion-salmeterol (ADVAIR  HFA) 115-21 mcg/actuation inhaler, INHALE 2 PUFFS TWICE A DAY, Disp: 12 g, Rfl: 1    fosfomycin  (MONUROL ) 3 gram Pack, Take 3 g by mouth once a week., Disp: 4 packet, Rfl: 5    gabapentin  (NEURONTIN ) 100 MG capsule, Take 1 capsule (100 mg total) by mouth Three (3) times a day., Disp: 270 capsule, Rfl: 3    HYDROcodone -chlorpheniramine  polistirex (TUSSIONEX PENNKINETIC ) 10-8 mg/5 mL ER suspension, Take 5 mL by mouth every twelve (12) hours as needed for cough., Disp: 115 mL, Rfl: 0    inhalational spacing device (AEROCHAMBER MV) Spcr, 1 spacer for inhaler, Disp: 1 each, Rfl: 0    lancets (ACCU-CHEK SOFTCLIX LANCETS) Misc, CHECK BLOOD SUGAR DAILY, Disp: 200 each, Rfl: 11    levoFLOXacin  (LEVAQUIN ) 750 MG  tablet, Take 1 tablet (750 mg total) by mouth every other day., Disp: 4 tablet, Rfl: 0    lidocaine  (ASPERCREME) 4 % patch, Place 1 patch on the skin daily. Remove after 12 hours., Disp: 30 patch, Rfl: 2    [Paused] metOLazone  (ZAROXOLYN ) 5 MG tablet, Take 1 tablet (5 mg total) by mouth daily as needed (up to 3x weekly when instructed by cardiology clinic)., Disp: 12 tablet, Rfl: 0    midodrine  (PROAMATINE ) 5 MG tablet, Take 1 tablet (5 mg total) by mouth three (3) times a day., Disp: 90 tablet, Rfl: 3    minocycline  (MINOCIN ) 100 MG capsule, Take 2 capsules (200 mg total) by mouth two (2) times a day., Disp: 20 capsule, Rfl: 0    NARCAN 4 mg/actuation nasal spray, 1 spray into alternating nostrils once as needed (opioid overdose). PRN - Emergency use., Disp: , Rfl:     nystatin  (MYCOSTATIN ) 100,000 unit/gram powder, Apply to affected area 3 times daily, Disp: 30 g, Rfl: 1    ondansetron  (ZOFRAN -ODT) 4 MG disintegrating tablet, Dissolve 1 tablet (4 mg total) in the mouth every eight (8) hours as needed for nausea., Disp: , Rfl:     oxyCODONE  (ROXICODONE ) 5 MG immediate release tablet, Take 1 tablet (5 mg total) by mouth every eight (8) hours as needed for pain., Disp: , Rfl:     OXYGEN -AIR DELIVERY SYSTEMS MISC, 4 L by Miscellaneous route continuous. (Patient taking differently: 3 L by Miscellaneous route continuous.), Disp: , Rfl:     pantoprazole  (PROTONIX ) 20 MG tablet, TAKE 1 TABLET ONCE DAILY, Disp: 100 tablet, Rfl: 3    rosuvastatin  (CRESTOR ) 5 MG tablet, TAKE 1 TABLET EVERY OTHER DAY, Disp: 45 tablet, Rfl: 3    spironolactone  (ALDACTONE ) 25 MG tablet, Take 0.5 tablets (12.5 mg total) by mouth daily., Disp: 30 tablet, Rfl: 2    tafamidis  (VYNDAMAX ) 61 mg cap, Take 1 capsule (61 mg) by mouth daily., Disp: 30 capsule, Rfl: 11    tiotropium bromide  (SPIRIVA  RESPIMAT) 2.5 mcg/actuation inhalation mist, Inhale 2 puffs daily., Disp: 4 g, Rfl: 11    torsemide  (DEMADEX ) 20 MG tablet, Take 5 tablets (100 mg total) by mouth every morning AND 2 tablets (40 mg total) daily with lunch. Take with food., Disp: 210 tablet, Rfl: 2    vitamin A -3,000 mcg RAE, 10,000 UNIT, 3,000 mcg RAE (10,000 UNIT) capsule, Take 1 capsule (3,000 mcg of RAE total) by mouth daily., Disp: , Rfl:     vutrisiran  (AMVUTTRA ) 25 mg/0.5 mL injection, Inject 0.5 mL (25 mg total) under the skin Take as directed. Every 6 Weeks, Disp: , Rfl:     ALLERGIES:   Nitrofurantoin and Lipitor  [atorvastatin ]    SOCIAL HISTORY:   Social History     Tobacco Use    Smoking status: Former     Current packs/day: 0.00     Average packs/day: 0.3 packs/day     Types: Cigarettes     Quit date: 2019     Years since quitting: 7.0    Smokeless tobacco: Never    Tobacco comments:     Quit a few years ago   Substance Use Topics    Alcohol use: No       FAMILY HISTORY:  Family History[3]       Review of Systems   A review of systems was performed and relevant portions were as noted above in HPI     Physical Exam     VITAL SIGNS:  BP 107/78  - Pulse 76  - Temp 36.9 ??C (98.5 ??F) (Oral)  - Resp 16  - LMP  (LMP Unknown)  - SpO2 99%     Constitutional:   Alert and oriented. In no acute distress. Please see pertinent exam above as well.  Head:   Normocephalic and atraumatic  Eyes:   Conjunctivae are normal, EOMI, PERRL  ENT:   No notable congestion, mucous membranes moist, external ears normal, no notable stridor  Cardiovascular:   Rate as vitals above. Appears warm and well perfused  Respiratory:   Normal respiratory effort. Breath sounds are normal.  Gastrointestinal:   Soft, non-distended, and nontender without rebound or guarding.   Genitourinary:   Deferred  Musculoskeletal:    Normal range of motion in all extremities. No tenderness noted in B/L lower extremities. Trace pitting edema to the bilateral ankles  Neurologic:   No gross focal neurologic deficits beyond baseline are appreciated-see above  Skin:   Skin is warm, dry and intact    Radiology     XR Chest 1 view Portable   Final Result      Mild interstitial pulmonary edema.                Labs     Labs Reviewed   COMPREHENSIVE METABOLIC PANEL - Abnormal; Notable for the following components:       Result Value    Potassium 5.0 (*)     BUN 77 (*)     Creatinine 2.64 (*)     eGFR [name]-EPI (2021) Female 17 (*)     Albumin  3.2 (*)     ALT <7 (*)     All other components within normal limits   TSH - Abnormal; Notable for the following components:    TSH 4.926 (*)     All other components within normal limits   URINALYSIS WITH MICROSCOPY - Abnormal; Notable for the following components:    Blood, UA Small (*)     RBC, UA 5 (*)     Bacteria, UA Rare (*)     Hyaline Casts, UA 16 (*)     All other components within normal limits   HIGH SENSITIVITY TROPONIN I - SERIAL - Abnormal; Notable for the following components:    hsTroponin I 59 (*)     All other components within normal limits   PRO-BNP - Abnormal; Notable for the following components:    PRO-BNP 5,070.0 (*)     All other components within normal limits    Narrative:     Effective 09/06/23, NT-proBNP replaces BNP, results are not interchangeable. Values less than 300 pg/mL strongly rule out the presence of acute heart failure.   HIGH SENSITIVITY TROPONIN I - 2 HOUR SERIAL - Abnormal; Notable for the following components:    hsTroponin I 60 (*)     All other components within normal limits   CBC W/ AUTO DIFF - Abnormal; Notable for the following components:    WBC 11.3 (*)     RBC 3.92 (*)     Absolute Neutrophils 9.3 (*)     Absolute Lymphocytes 0.7 (*)     Absolute Monocytes 1.0 (*)     All other components within normal limits   MAGNESIUM  - Normal   LIPASE - Normal   CBC W/ DIFFERENTIAL    Narrative:     The following orders were created for panel order CBC w/ Differential.  Procedure                               Abnormality         Status                                     ---------                               -----------         ------                                     CBC w/ Differential[([id number]]         Abnormal            Final result                                                 Please view results for these tests on the individual orders.   UREA NITROGEN, URINE   PROTEIN / CREATININE RATIO, URINE   SODIUM, URINE, RANDOM   HIGH SENSITIVITY TROPONIN I - 6 HOUR SERIAL       Pertinent labs & imaging results that were available during my care of the patient were reviewed by me and considered in my medical decision making. Labs and radiology studies included in my note may not constitute all ordered/reviewied labs during this encounter (see chart for details).      Please note- This chart has been created using [employer]. Chart creation errors have been sought, but may not always be located and such creation errors, especially pronoun confusion, do NOT reflect on the standard of medical care.    Documentation assistance was provided by Jorene Gully, Scribe on January 23, 2025 at 10:54 PM for Rollene Hy, MD.   January 24, 2025 1:13 AM. Documentation assistance provided by the scribe. I was present during the time the encounter was recorded. The information recorded by the scribe was done at my direction and has been reviewed and validated by me.            [1]   Past Medical History:  Diagnosis Date    Acute exacerbation of CHF (congestive heart failure) (CMS-HCC) 08/19/2023    Acute exacerbation of CHF (congestive heart failure) (CMS-HCC) 08/19/2023    Acute kidney injury superimposed on chronic kidney disease 10/11/2020    Acute on chronic diastolic (congestive) heart failure (CMS-HCC) 08/23/2020    Acute on chronic diastolic congestive heart failure (CMS-HCC) 08/23/2020    AKI (acute kidney injury) 04/14/2015    Lab Results  Component  Value  Date     CREATININE  1.90 (H)  06/12/2021     Had a bump in her creatinine when she was taking her diuretics every day.  She is currently taking 40 mg daily of torsemide  and 50 mg of spironolactone .  Her volume status is fragile.  Previously when she stopped her diuretic she becomes short of breath.  Plan: We will check her BMP today.  We will likely have to go to 40  AKI (acute kidney injury) 04/14/2015    Lab Results      Component    Value    Date           CREATININE    1.90 (H)    06/12/2021     Had a bump in her creatinine when she was taking her diuretics every day.  She is currently taking 40 mg daily of torsemide  and 50 mg of spironolactone .  Her volume status is fragile.  Previously when she stopped her diuretic she becomes short of breath.  Plan: We will check her BMP today.  We will likel    Anemia 08/22/2019    Iron  deficiency anemia          Lab Results      Component    Value    Date           WBC    9.2    03/21/2021           RBC    3.67 (L)    03/21/2021           HGB    9.9 (L)    03/21/2021           HCT    30.7 (L)    03/21/2021           MCV    83.7    03/21/2021           MCH    26.9    03/21/2021           MCHC    32.1    03/21/2021           RDW    21.9 (H)    03/21/2021           PLT    318     Arthritis     Calculus of kidney     Calculus of ureter     CHF (congestive heart failure) (CMS-HCC)     Chronic atrial fibrillation    (CMS-HCC) 07/20/2019    COPD (chronic obstructive pulmonary disease) (CMS-HCC)     Diabetes (CMS-HCC)     Gangrenous cholecystitis 10/11/2020    Generalized edema  06/17/2021    GERD (gastroesophageal reflux disease)     Hydronephrosis     Hypertension     Hyponatremia 10/11/2020    Intermediate coronary syndrome (CMS-HCC) 03/13/2014    Lower extremity edema 09/28/2020    Lumbar stenosis     Microscopic hematuria     Nausea alone     Nephrolithiasis 04/17/2016    Neuropathy     Nocturia     Nocturia 07/01/2017    Other chronic cystitis     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  See plans for anemia, AKI  We are also tapering her gabapentin .  Currently on 300 mg 3 times daily.  We will decrease it to twice daily, and then nightly.  She states she is not having any recurrence of her pain.    Pulmonary hypertension    (CMS-HCC)     Renal colic     Shortness of breath 04/28/2020    Sleep apnea     Unstable angina pectoris    (CMS-HCC) 03/13/2014   [2]   Past Surgical History:  Procedure Laterality Date    BACK SURGERY  1995    CARPAL TUNNEL RELEASE Left 2014    HYSTERECTOMY  1971  IR INSERT CHOLECYSTOSTOMY TUBE PERCUTANEOUS  10/02/2020    IR INSERT CHOLECYSTOSMY TUBE PERCUTANEOUS 10/02/2020 Ivonne Butler Seed, MD IMG VIR [facility]    LUMBAR DISC SURGERY      PR REMOVAL GALLBLADDER N/A 10/06/2020    Procedure: CHOLECYSTECTOMY;  Surgeon: Curtistine Mira Dunnings, MD;  Location: MAIN OR [facility];  Service: Trauma    PR RIGHT HEART CATH O2 SATURATION & CARDIAC OUTPUT N/A 09/30/2020    Procedure: Right Heart Catheterization;  Surgeon: Reyes Jerilynn Sage, MD;  Location: [facility] CATH;  Service: Cardiology    PR RIGHT HEART CATH O2 SATURATION & CARDIAC OUTPUT N/A 01/09/2021    Procedure: Right Heart Catheterization;  Surgeon: Norleen Deward Grumbling, MD;  Location: [facility] CATH;  Service: Cardiology   [3]   Family History  Problem Relation Age of Onset    Hypertension Mother     Cancer Father         COLON CANCER    No Known Problems Sister     No Known Problems Sister     No Known Problems Brother     No Known Problems Brother     No Known Problems Maternal Aunt     No Known Problems Maternal Uncle     No Known Problems Paternal Aunt     No Known Problems Paternal Uncle     No Known Problems Maternal Grandmother     No Known Problems Maternal Grandfather     No Known Problems Paternal Grandmother     No Known Problems Paternal Grandfather     No Known Problems Other     Anesthesia problems Neg Hx     Broken bones Neg Hx     Clotting disorder Neg Hx     Collagen disease Neg Hx     Diabetes Neg Hx     Dislocations Neg Hx     Fibromyalgia Neg Hx     Gout Neg Hx     Hemophilia Neg Hx     Osteoporosis Neg Hx     Rheumatologic disease Neg Hx     Scoliosis Neg Hx     Severe sprains Neg Hx     Sickle cell anemia Neg Hx     Spinal Compression Fracture Neg Hx     GU problems Neg Hx     Kidney cancer Neg Hx     Prostate cancer Neg Hx         Othel Rollene BRAVO, MD  Resident  01/24/25 [phone]

## 2025-01-24 ENCOUNTER — Inpatient Hospital Stay
Admission: EM | Admit: 2025-01-24 | Discharge: 2025-01-26 | Disposition: A | Discharge status: Home / Self Care | Payer: Medicare (Managed Care) | Admitting: Medical

## 2025-01-24 ENCOUNTER — Ambulatory Visit
Admission: EM | Admit: 2025-01-24 | Discharge: 2025-01-26 | Disposition: A | Discharge status: Home / Self Care | Payer: Medicare (Managed Care) | Admitting: Medical

## 2025-01-24 ENCOUNTER — Ambulatory Visit
Admission: EM | Admit: 2025-01-24 | Discharge: 2025-01-26 | Disposition: A | Discharge status: Home Health | Payer: Medicare (Managed Care) | Admitting: Medical

## 2025-01-24 DIAGNOSIS — E854 Organ-limited amyloidosis: Principal | ICD-10-CM

## 2025-01-24 DIAGNOSIS — I43 Cardiomyopathy in diseases classified elsewhere: Secondary | ICD-10-CM

## 2025-01-24 LAB — URINALYSIS WITH MICROSCOPY
BILIRUBIN UA: NEGATIVE
GLUCOSE UA: NEGATIVE
HYALINE CASTS: 16 /LPF — ABNORMAL HIGH (ref 0–1)
KETONES UA: NEGATIVE
LEUKOCYTE ESTERASE UA: NEGATIVE
NITRITE UA: NEGATIVE
PH UA: 5 (ref 5.0–9.0)
PROTEIN UA: NEGATIVE
RBC UA: 5 /HPF — ABNORMAL HIGH (ref <=4)
SPECIFIC GRAVITY UA: 1.012 (ref 1.003–1.030)
SQUAMOUS EPITHELIAL: <1 /HPF (ref 0–5)
UROBILINOGEN UA: <2
WBC UA: 1 /HPF (ref 0–5)

## 2025-01-24 LAB — BASIC METABOLIC PANEL
ANION GAP: 12 mmol/L (ref 5–14)
ANION GAP: 14 mmol/L (ref 5–14)
BLOOD UREA NITROGEN: 70 mg/dL — ABNORMAL HIGH (ref 9–23)
BLOOD UREA NITROGEN: 82 mg/dL — ABNORMAL HIGH (ref 9–23)
BUN / CREAT RATIO: 27
BUN / CREAT RATIO: 33
CALCIUM: 9.2 mg/dL (ref 8.7–10.4)
CALCIUM: 9.3 mg/dL (ref 8.7–10.4)
CHLORIDE: 100 mmol/L (ref 98–107)
CHLORIDE: 94 mmol/L — ABNORMAL LOW (ref 98–107)
CO2: 23 mmol/L (ref 20.0–31.0)
CO2: 26.9 mmol/L (ref 20.0–31.0)
CREATININE: 2.57 mg/dL — ABNORMAL HIGH (ref 0.55–1.02)
CREATININE: 2.5 mg/dL — ABNORMAL HIGH (ref 0.55–1.02)
EGFR CKD-EPI (2021) FEMALE: 18 mL/min/{1.73_m2} — ABNORMAL LOW (ref >=60)
EGFR CKD-EPI (2021) FEMALE: 19 mL/min/{1.73_m2} — ABNORMAL LOW (ref >=60)
GLUCOSE RANDOM: 94 mg/dL (ref 70–179)
GLUCOSE RANDOM: 125 mg/dL (ref 70–179)
POTASSIUM: 4.7 mmol/L (ref 3.4–4.8)
POTASSIUM: 4.4 mmol/L (ref 3.4–4.8)
SODIUM: 135 mmol/L (ref 135–145)
SODIUM: 135 mmol/L (ref 135–145)

## 2025-01-24 LAB — CBC
HEMATOCRIT: 34.5 % (ref 34.0–44.0)
HEMOGLOBIN: 11.6 g/dL (ref 11.3–14.9)
MEAN CORPUSCULAR HEMOGLOBIN CONC: 33.6 g/dL (ref 32.0–36.0)
MEAN CORPUSCULAR HEMOGLOBIN: 30.4 pg (ref 25.9–32.4)
MEAN CORPUSCULAR VOLUME: 90.5 fL (ref 77.6–95.7)
MEAN PLATELET VOLUME: 9.5 fL (ref 6.8–10.7)
PLATELET COUNT: 137 10*9/L — ABNORMAL LOW (ref 150–450)
RED BLOOD CELL COUNT: 3.82 10*12/L — ABNORMAL LOW (ref 3.95–5.13)
RED CELL DISTRIBUTION WIDTH: 13.8 % (ref 12.2–15.2)
WBC ADJUSTED: 10.3 10*9/L (ref 3.6–11.2)

## 2025-01-24 LAB — HIGH SENSITIVITY TROPONIN I - 6 HOUR SERIAL
HIGH SENSITIVITY TROPONIN - DELTA (2-6H): 17 ng/L — ABNORMAL HIGH (ref <=7)
HIGH-SENSITIVITY TROPONIN I - 6 HOUR: 43 ng/L (ref <=34)

## 2025-01-24 LAB — UREA NITROGEN, URINE: UREA NITROGEN URINE: 289 mg/dL

## 2025-01-24 LAB — HIGH SENSITIVITY TROPONIN I - 2 HOUR SERIAL
HIGH SENSITIVITY TROPONIN - DELTA (0-2H): 1 ng/L (ref <=7)
HIGH-SENSITIVITY TROPONIN I - 2 HOUR: 60 ng/L (ref <=34)

## 2025-01-24 LAB — SODIUM, URINE, RANDOM: SODIUM URINE: 86 mmol/L

## 2025-01-24 LAB — MAGNESIUM: MAGNESIUM: 2.4 mg/dL (ref 1.6–2.6)

## 2025-01-24 LAB — ALBUMIN / CREATININE URINE RATIO
ALBUMIN QUANT URINE: <0.3 mg/dL
ALBUMIN/CREATININE RATIO: <9.2 ug/mg (ref 0.0–30.0)
CREATININE, URINE: 32.5 mg/dL

## 2025-01-24 LAB — PROTEIN / CREATININE RATIO, URINE
CREATININE, URINE: 32.9 mg/dL
PROTEIN URINE: 10.9 mg/dL
PROTEIN/CREAT RATIO, URINE: 0.331

## 2025-01-24 LAB — PHOSPHORUS: PHOSPHORUS: 4.9 mg/dL (ref 2.4–5.1)

## 2025-01-24 MED ADMIN — heparin (porcine) 5,000 unit/mL injection 5,000 Units: 5000 [IU] | SUBCUTANEOUS | @ 19:00:00

## 2025-01-24 MED ADMIN — heparin (porcine) 5,000 unit/mL injection 5,000 Units: 5000 [IU] | SUBCUTANEOUS | @ 12:00:00

## 2025-01-24 MED ADMIN — ondansetron (ZOFRAN) injection 4 mg: 4 mg | INTRAVENOUS | @ 18:00:00

## 2025-01-24 MED ADMIN — cholecalciferol (vitamin D3 25 mcg (1,000 units)) tablet 25 mcg: 25 ug | ORAL | @ 16:00:00

## 2025-01-24 MED ADMIN — minocycline (MINOCIN) capsule 200 mg: 200 mg | ORAL | @ 18:00:00 | Stop: 2025-01-31

## 2025-01-24 MED ADMIN — guaiFENesin (ROBITUSSIN) oral syrup: 200 mg | ORAL | @ 21:00:00

## 2025-01-24 MED ADMIN — midodrine (PROAMATINE) tablet 5 mg: 5 mg | ORAL | @ 22:00:00

## 2025-01-24 MED ADMIN — midodrine (PROAMATINE) tablet 5 mg: 5 mg | ORAL | @ 18:00:00

## 2025-01-24 MED ADMIN — midodrine (PROAMATINE) tablet 5 mg: 5 mg | ORAL | @ 16:00:00

## 2025-01-24 MED ADMIN — ferrous sulfate tablet 325 mg: 325 mg | ORAL | @ 16:00:00

## 2025-01-24 MED ADMIN — gabapentin (NEURONTIN) capsule 100 mg: 100 mg | ORAL | @ 16:00:00

## 2025-01-24 MED ADMIN — fluticasone furoate-vilanterol (BREO ELLIPTA) 100-25 mcg/dose inhaler 1 puff: 1 | RESPIRATORY_TRACT | @ 18:00:00

## 2025-01-24 MED ADMIN — amiodarone (PACERONE) tablet 200 mg: 200 mg | ORAL | @ 16:00:00

## 2025-01-24 MED ADMIN — oxyCODONE (ROXICODONE) immediate release tablet 5 mg: 5 mg | ORAL | @ 16:00:00 | Stop: 2025-01-31

## 2025-01-24 MED ADMIN — **tafamidis cap 61 mg Patient Own Med**: 61 mg | ORAL | @ 19:00:00 | NDC 00069873030

## 2025-01-24 MED ADMIN — lidocaine (ASPERCREME) 4 % 1 patch: 1 | TRANSDERMAL | @ 16:00:00

## 2025-01-24 MED ADMIN — lactated ringers bolus 500 mL: 500 mL | INTRAVENOUS | @ 07:00:00 | Stop: 2025-01-24

## 2025-01-24 MED ADMIN — ipratropium-albuterol (DUO-NEB) 0.5-2.5 mg/3 mL nebulizer solution 3 mL: 3 mL | RESPIRATORY_TRACT | @ 16:00:00

## 2025-01-24 MED ADMIN — ipratropium-albuterol (DUO-NEB) 0.5-2.5 mg/3 mL nebulizer solution 3 mL: 3 mL | RESPIRATORY_TRACT | @ 10:00:00

## 2025-01-24 MED ADMIN — pantoprazole (Protonix) EC tablet 20 mg: 20 mg | ORAL | @ 16:00:00

## 2025-01-24 MED ADMIN — acetaminophen (TYLENOL) tablet 1,000 mg: 1000 mg | ORAL | @ 08:00:00 | Stop: 2025-01-24

## 2025-01-24 NOTE — Plan of Care (Signed)
 Pt was transfer from CCU this afternoon. Daughter was at bedside and will be staying the night with patient. Pt is A&Ox4, on 3L O2 (baseline), vital signs are stable. Pt is one person assist with walker. Pt has purewick in place and BSC. Pt takes meds whole. Pt is compliant with today's POC and med admin. Call bell within reach. Pt instruced to call for assist as needed.    Problem: Adult Inpatient Plan of Care  Goal: Absence of Hospital-Acquired Illness or Injury  Intervention: Identify and Manage Fall Risk  Recent Flowsheet Documentation  Taken 01/24/2025 1800 by Synthia Elda SAUNDERS, RN  Safety Interventions:   fall reduction program maintained   low bed   nonskid shoes/slippers when out of bed  Taken 01/24/2025 1600 by Sanaiya Welliver R, RN  Safety Interventions:   fall reduction program maintained   low bed   nonskid shoes/slippers when out of bed  Intervention: Prevent Skin Injury  Recent Flowsheet Documentation  Taken 01/24/2025 1600 by Synthia Elda SAUNDERS, RN  Positioning for Skin: Supine/Back     Problem: Fall Injury Risk  Goal: Absence of Fall and Fall-Related Injury  Intervention: Promote Injury-Free Environment  Recent Flowsheet Documentation  Taken 01/24/2025 1800 by Synthia Elda SAUNDERS, RN  Safety Interventions:   fall reduction program maintained   low bed   nonskid shoes/slippers when out of bed  Taken 01/24/2025 1600 by Dayn Barich R, RN  Safety Interventions:   fall reduction program maintained   low bed   nonskid shoes/slippers when out of bed

## 2025-01-24 NOTE — H&P (Signed)
 [facility] Medicine   History and Physical       Assessment and Plan     Barbara Huber is a 84 y.o. female who is presenting to [facility] with AKI (acute kidney injury), in the setting of the following pertinent/contributing co-morbidities: COPD, chronic hypoxic respiratory failure, cardiac amyloidosis, chronic [name], [name] .    AKI (acute kidney injury) - [name] stage IV  Chronic illness with severe exacerbation or progression that has a significant risk of morbidity without appropriate treatment  History of [name] stage IV and history of AKI secondary to cardiorenal syndrome; low concern for amyloid related kidney disease in the past. Recent CR over last 3 months mostly 2-2.6 but as low as 1.8 on 01/06/25 and as high as 2.81 on 12/21/24. Per family, torsemide  previously 100 mg qam and 40 mg in afternoon upon discharge from [facility] on 12/23/24 (EDW 166/167 per discharge summary). Recently, afternoon dose increased to 60 mg. She has also been treated for cough and respiratory infection with augment, doxycycline  then levaquin  (stopped due to neuropathic pain) then minocycline . Respiratory culture from 01/09/25 grew 4+ stenotrophomonas, 3+ mixed GNR. Daughter reports poor po intake for last few weeks and weight down to 158-159. Pro BNP down to 5070 from 7382 on 1/10 and 88042 on 12/29. UA with hyaline casts. Cr 2.64 up from 1.8 so technically AKI but within recently variability. May have component of prerenal AKI in setting of poor po intake and respiratory infection. No significant edema on exam and CXR with prominent interstitial marking similar to prior study.   - repeat Cr (got 500 ml NS in ED)  - trend Cr, urine output; check UPC  - avoid nephrotoxins, renal dosing of medications  - hold diuretics pending repeat labs  - if Cr worsening, consider nephrology and/or cardiology consult given history of more severe AKI due to cardiorenal    Cardiac amyloidosis - [name] - AF  Appears euvolemic, possibly mildly volume down given weight below EDW and poor po intake recently. Got 500 ml IVF in ED.  - hold torsemide , spironolactone   - consider restarting at lower dose pending repeat labs and volume status (has not been on metolazone  for awhile)  - continue amiodarone , midodrine   - crestor  non formulary and allergic to formulary equivalent atorvastatin   - no on anticoagulation due to issues with anemia per notes  - continue tafamidis , not due for vutrisian until 01/26/25    Persistent cough concern for pulmonary infection  COPD - pulmonary hypertension - chronic hypoxic respiratory failure  Seen in the ED for cough on 01/06/2025 and diagnosed with pneumonia.  RPP positive for coronavirus NL 63 and discharged on Augmentin  and doxycycline  due to concerns for pneumonia.  She had an outpatient lower respiratory culture obtained on 01/09/2025 which subsequently grew stenotrophomonas  and 3+ mixed gram-negative rods; history of smooth Pseudomonas several months ago but could not find evidence of stenotrophomonas in the past.  As an outpatient she was started on Levaquin  which she could not tolerate due to neuropathic pain and then transition to minocycline .  She has several days left of minocycline .  She continues to have persistent cough.  Remains on her home oxygen  with no evidence of worsening hypoxia.   - check chest CT; discussed at prior pulmonary clinic visit but held off since symptoms were Improving at that time  - continue minocycline ; per [facility] antibiogram stenotrophomonas should be susceptible to it (and other oral option is bactrim  which would like to avoid in AKI)  -  continue home 3L O2 during day, trilogy/O2 avaps at night  - duonebs 4x daily and add aerobika; uses albuterol  once a day for airway clearance at home  - hold Spiriva  while on duonebs  - continue Advair  or formulary equivalent  - repeat sputum culture, check RPP  - consult pulmonary in am given new organism on recent sputum culture and persistent symptoms    Generalized weakness  Generalized weakness and unable to ambulate due to weakness, fatigue and knee pain. Suspect multifactorial due to poor po intake, weight loss, infection, AKI plus underlying cardiac disease, lung disease, [name].  Also had generalized leg weakness last admission with improved with supportive care  - continue flexeril , lidoderm  patch, gabapentin , oxycodone  prn, tylenol  for knee pain  - PT/OT evaluations    Prophylaxis  -heparin   -on fosfomycin  prophylaxis weekly    Diet  -Nutrition Therapy Heart Healthy    Code Status / HCDM  -Full Code, Discussed with patient at the time of admission   -  HCDM (With Legal Document To Support): Barbara Huber - Daughter - [phone]    Anticipated Medically Ready for Discharge: Anticipated in 2-4 Days    Significant Comorbid Conditions:     -Age related debility POA requiring additional resources: DME, PT, or OT  -Dehydration POA requiring further investigation, treatment, or monitoring    Issues Impacting Complexity of Management:  -High risk of complications from pain and/or analgesia likely to result in delirium  -The patient is at high risk from Hospital immobility in an elderly patient given baseline poor functional status with a high risk of causing delirium and further decline in function  -The patient is at high risk for the development of complications of volume overload due to the need to provide IV hydration for suspected hypovolemia in the setting of: heart failure and stage IV or greater [name]  -Need for the following intensive monitoring parameter(s) due to high risk of clinical decline: continuous oxygen  monitoring and telemetry  -Need for intensive oxygen  therapy of trilogy o2 avaps, which places the patient at high risk for oxygen  toxicity    Medical Decision Making: Reviewed records from the following unique sources  multiple prior clinic notes, recent discharge summary, last for last several weeks. Discussed the patient's management and/or test interpretation with ED Provider as summarized within this note    I personally spent greater than 75 minutes face-to-face and non-face-to-face in the care of this patient, which includes all pre, intra, and post visit time on the date of service.  All documented time was specific to the E/M visit and does not include any procedures that may have been performed.    HPI      Barbara Huber is a 84 y.o. female who is presenting to [facility] with AKI (acute kidney injury).        History of Present Illness  Barbara Huber is an 84 year old female with heart disease who presents with weakness in her legs and difficulty walking. She is accompanied by her daughter, who is her healthcare power of attorney.    She weakness in her legs, particularly in the right leg, which began today. Her legs feel as if they are 'giving out,' making it difficult to walk even with a walker. She is unable to stand and can only walk around the house with assistance. Additionally, she has a sore right knee, which she suspects may be due to arthritis. She typically ambulates with a walker.     She has had a  persistent cough since Christmas, occurring daily. She was treated with multiple antibiotics, including amoxicillin , Augmentin , Levaquin , and minocycline . Levaquin  was discontinued due to neuropathy pains in her feet. She has taken minocycline  for three days but feels generally unwell. She has lost approximately seven pounds over the past few weeks and has a decreased appetite. Weight down to 158-159. Previously on torsemide  100 mg qam and 40 mg qpm and recently pm dose increased to 60 mg.     She uses three liters of oxygen  continuously. Her kidney function has fluctuated. She has a history of heart disease and pulmonary infections, which have impacted her kidney function and overall health.    No breathing difficulties, chest pain, fevers, chills, abdominal pain, urinary changes, or leg swelling.      ER course was notable for 500 ml LR bolus      Med Rec Confidence   I reviewed the Medication List. The current list is Accurate    Physical Exam   Temp:  [36.9 ??C (98.5 ??F)-37.1 ??C (98.8 ??F)] 36.9 ??C (98.5 ??F)  Pulse:  [74-91] 76  SpO2 Pulse:  [73-91] 76  Resp:  [15-16] 16  BP: (107-146)/(78-85) 107/78  SpO2:  [98 %-100 %] 99 %  There is no height or weight on file to calculate BMI.      General: alert, nad  HEENT: no scleral icterus, normal nose, mmm  Cardiovascular: normal rate, regular rhythm, no murmurs appreciated  Respiratory: normal work of breathing on O2, coarse rhonchi with some basilar crackles, no wheezes  Gastrointestinal: soft, non distended, non tender  Musculoskeletal/Extremities: trace edema  Neurologic: alert, conversant, follows commands  Skin: warm, dry  Psychiatric: cooperative, normal mood and affect

## 2025-01-24 NOTE — Treatment Plan (Signed)
 Move pt from stepdown to floor, can use avvaps  on floor for sleep. If requiring continuous, would need to return to stepdown.     AKI: Repeat BMP at 16:00, if Cr stable, can resume diuretics in AM     Cough: Subacute, suspect post viral cough.  Admitted in December for SOB/volume, cough developed at time of dc. ~ 2 weeks later, cough persisting, positive for coronovirus NL63 on 1/10. This strain particularly causes more severe pulmonary issues. She is not worse since that time, but is also not better. On 3L O2 at baseline, no increased needs.   -Can consider steroids if not improving.   - No clear signs of infection currently, but sputum from 1/10 with Stenotrophomonas maltophilia. Prescribed minocycline  at home, took partial course, thinks there was 2-3 days left    Weakness: Suspect deconditioning in setting of recurrent acute illness and multiple admissions.   -PT/OT consult

## 2025-01-24 NOTE — Consults (Signed)
 PHYSICAL THERAPY  Evaluation (01/24/25 1405)          Patient Name:  Barbara Huber       Medical Record Number: [id number]   Date of Birth: 1941-07-31  Sex: Female        Post-Discharge Physical Therapy Recommendations:  PT Post Acute Discharge Recommendations: Supervision for all mobility due to fall risk, 5x weekly, Low intensity      Equipment Recommendation  PT DME Recommendations: Defer to post acute          Treatment Diagnosis: Abnormalities of gait and mobility, Difficulty in walking, Generalized muscle weakness, Impaired balance, Unsteadiness on feet        ASSESSMENT  Problem List: Decreased strength, Impaired balance, Decreased mobility, Fall risk, Decreased endurance, At risk for deconditioning, Gait deviation, Impaired ADLs, Pain      Assessment : Barbara Huber is a 84 y.o. female who is presenting to [facility] with AKI (acute kidney injury), in the setting of the following pertinent/contributing co-morbidities: COPD, chronic hypoxic respiratory failure, cardiac amyloidosis, chronic [name], [name]. pt presents to PT with strength, endurance and balance deficits all impacting her functional mobility skills and increasing her falls risk who will benefit from PT intervention to address.    After review of contributing co-morbitities and personal factors, clinical presentation and exam findings, patient demonstrates moderate complexity for evaluation and development of POC.  Today's Interventions: pt ed re PT POC, mobility, sitting and standing balance/tolerance, pre-gait, positioning, eval     Personal Factors/Comorbidities Present: 3+ factors   Examination of Body systems: 4+ elements  Clinical Presentation: Evolving    Eval Complexity : Moderate Complexity     Activity Tolerance: Tolerated treatment well, Other (weakness)       PLAN  Planned Frequency of Treatment: Plan of Care Initiated: 01/24/25  1-2x per day Weekly Frequency: 4-5 days per week  Planned Treatment Duration: 02/06/25     Planned Interventions: Education (Patient/Family/Caregiver), Therapeutic Exercise, Therapeutic Activity     Goals:   Patient and Family Goals: home     SHORT GOAL #1: pt will perform bed mobility mod I               Time Frame : 2 weeks  SHORT GOAL #2: pt will perform all transfers mod I, LRAD              Time Frame : 2 weeks  SHORT GOAL #3: pt will ambulate 50 ft x 2 mod I, LRAD SpO2 >93%              Time Frame : 2 weeks                                Prognosis:  Good  Positive Indicators: caregiver support, participation  Barriers to Discharge: Endurance deficits, Functional strength deficits, Inability to safely perform ADLS     SUBJECTIVE  Communication Preference: Verbal     Patient reports: pt is agreeable for PT  Pain Comments: R knee, has been bothering her for a couple of weeks, has lidocaine  patch on, did not rate  Medical Updates Since Last Visit/Relevant PMH Affecting Clinical Decision Making: pt admitted with AKI  Services patient receives prior to admission: PT  Prior Functional Status: pt is ambulatory short distances only with rollator, w/c for community, 3 L [place] baseline  Living Situation  Living Environment: House  Lives With: Daughter, Family  Home Living: One level  home, Walk-in shower, Grab bars in shower, Built-in shower seat, Shower chair with back, Ramped entrance, Bedside commode, Handicapped height toilet  Caregiver Identified?: Yes  Caregiver Availability: 24 hours  Caregiver Ability: Limited lifting  Caregiver Identified?: Yes   Equipment available at home: Wheelchair-manual, Rollator        Past Medical History[1]         Social History     Tobacco Use    Smoking status: Former     Current packs/day: 0.00     Average packs/day: 0.3 packs/day     Types: Cigarettes     Quit date: 2019     Years since quitting: 7.0    Smokeless tobacco: Never    Tobacco comments:     Quit a few years ago   Substance Use Topics    Alcohol use: No       Past Surgical History[2]          Family History[3]     Allergies: Nitrofurantoin and Lipitor  [atorvastatin ]                  Objective Findings  Precautions / Restrictions  Precautions: Falls precautions  Required Braces or Orthoses: Non-applicable     Medical Tests / Procedures: reviewed in Epic  Equipment / Environment: Vascular access (PIV, TLC, Port-a-cath, PICC), Supplemental oxygen , Telemetry     Vitals/Orthostatics : VSS per telemetry, NAD, asymptomatic     Cognition: Follows 1-step commands     Hearing: No deficit identified     Skin Inspection: Intact where visualized     Upper Extremities  UE Strength: Right Impaired/Limited, Left Impaired/Limited  RUE Strength Impairment: Reduced strength  LUE Strength Impairment: Reduced strength    Lower Extremities  LE Strength: Right Impaired/Limited, Left Impaired/Limited  RLE Strength Impairment: Reduced strength  LLE Strength Impairment: Reduced strength  LE comment: strong UE push and assist for STS,  required assist for LE management with sit to supine    Face, Cervical and Trunk ROM  Trunk ROM: Impaired     Sensation: Impaired (B peripheral neuropathy feet to knees)  Posture: Rounded shoulders, Forward head    Static Sitting-Level of Assistance: Supervision  Dynamic Sitting-Level of Assistance: Administrator, Sports of Assistance: Contact guard  Dynamic Standing - Level of Assistance: Minimum assistance  Standing Balance comments: RW      Bed Mobility        Supine to Sit assistance level: Minimal assist, patient does 75% or more  Sit to Supine assistance level: Minimal assist, patient does 75% or more       Transfers  Sit to Stand assistance level: Moderate assist, patient does 50-74%     Transfer comments: x 3 trials (rollator and RW)    Ambulation  Level of Assistance: Minimal assist, patient does 75% or more  Assistive Device: Rolling walker       Ambulation comments: pt was able to take a few short shuffle steps forward and backward x 3 trials with rest break, forward flexed, unsteady Endurance: deconditioned, + easy fatigue with rest breaks    Patient at end of session: All needs in reach, Lines intact, Notified Nurse, Friends/Family present, In bed            AM-PAC 5 click  Help currently need turning over In bed?: A Little - Minimal/Contact Guard Assist/Supervision  Help currently needed sitting down/standing up from chair with arms? : A lot - Maximum/Moderate Assistance  Help currently  needed moving from supine to sitting on edge of bed?: A Little - Minimal/Contact Guard Assist/Supervision  Help currently needed moving to and from bed from wheelchair?: A Little - Minimal/Contact Guard Assist/Supervision  Help currently needed walking in a hospital room?: A lot - Maximum/Moderate Assistance      Basic Mobility Score 5 click: 13    Score (in points): % of Functional Impairment, Limitation, Restriction  5: 100% impaired, limited, restricted  6-7: At least 80%, but less than 100% impaired, limited restricted  8-11: At least 60%, but less than 80% impaired, limited restricted  12-16: At least 40%, but less than 60% impaired, limited restricted  17-18: At least 20%, but less than 40% impaired, limited restricted  19: At least 1%, but less than 20% impaired, limited restricted  20: 0% impaired, limited restricted    'AM-PAC' forms are Copyright protected by [employer]     Physical Therapy Session Duration  PT Individual [mins]: 42    I attest that I have reviewed the above information.  SignedBETHA TERRY FISCHER, PT  Filed 01/24/2025          [1]   Past Medical History:  Diagnosis Date    Acute exacerbation of CHF (congestive heart failure) (CMS-HCC) 08/19/2023    Acute exacerbation of CHF (congestive heart failure) (CMS-HCC) 08/19/2023    Acute kidney injury superimposed on chronic kidney disease 10/11/2020    Acute on chronic diastolic (congestive) heart failure (CMS-HCC) 08/23/2020    Acute on chronic diastolic congestive heart failure (CMS-HCC) 08/23/2020    AKI (acute kidney injury) 04/14/2015    Lab Results  Component  Value  Date     CREATININE  1.90 (H)  06/12/2021     Had a bump in her creatinine when she was taking her diuretics every day.  She is currently taking 40 mg daily of torsemide  and 50 mg of spironolactone .  Her volume status is fragile.  Previously when she stopped her diuretic she becomes short of breath.  Plan: We will check her BMP today.  We will likely have to go to 40    AKI (acute kidney injury) 04/14/2015    Lab Results      Component    Value    Date           CREATININE    1.90 (H)    06/12/2021     Had a bump in her creatinine when she was taking her diuretics every day.  She is currently taking 40 mg daily of torsemide  and 50 mg of spironolactone .  Her volume status is fragile.  Previously when she stopped her diuretic she becomes short of breath.  Plan: We will check her BMP today.  We will likel    Anemia 08/22/2019    Iron  deficiency anemia          Lab Results      Component    Value    Date           WBC    9.2    03/21/2021           RBC    3.67 (L)    03/21/2021           HGB    9.9 (L)    03/21/2021           HCT    30.7 (L)    03/21/2021           MCV  83.7    03/21/2021           MCH    26.9    03/21/2021           MCHC    32.1    03/21/2021           RDW    21.9 (H)    03/21/2021           PLT    318     Arthritis     Calculus of kidney     Calculus of ureter     CHF (congestive heart failure) (CMS-HCC)     Chronic atrial fibrillation    (CMS-HCC) 07/20/2019    COPD (chronic obstructive pulmonary disease) (CMS-HCC)     Diabetes (CMS-HCC)     Gangrenous cholecystitis 10/11/2020    Generalized edema  06/17/2021    GERD (gastroesophageal reflux disease)     Hydronephrosis     Hypertension     Hyponatremia 10/11/2020    Intermediate coronary syndrome (CMS-HCC) 03/13/2014    Lower extremity edema 09/28/2020    Lumbar stenosis     Microscopic hematuria     Nausea alone     Nephrolithiasis 04/17/2016    Neuropathy     Nocturia     Nocturia 07/01/2017    Other chronic cystitis     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  See plans for anemia, AKI  We are also tapering her gabapentin .  Currently on 300 mg 3 times daily.  We will decrease it to twice daily, and then nightly.  She states she is not having any recurrence of her pain.    Pulmonary hypertension    (CMS-HCC)     Renal colic     Shortness of breath 04/28/2020    Sleep apnea     Unstable angina pectoris    (CMS-HCC) 03/13/2014   [2]   Past Surgical History:  Procedure Laterality Date    BACK SURGERY  1995    CARPAL TUNNEL RELEASE Left 2014    HYSTERECTOMY  1971    IR INSERT CHOLECYSTOSTOMY TUBE PERCUTANEOUS  10/02/2020    IR INSERT CHOLECYSTOSMY TUBE PERCUTANEOUS 10/02/2020 Ivonne Butler Seed, MD IMG VIR [facility]    LUMBAR DISC SURGERY      PR REMOVAL GALLBLADDER N/A 10/06/2020    Procedure: CHOLECYSTECTOMY;  Surgeon: Curtistine Mira Dunnings, MD;  Location: MAIN OR [facility];  Service: Trauma    PR RIGHT HEART CATH O2 SATURATION & CARDIAC OUTPUT N/A 09/30/2020    Procedure: Right Heart Catheterization;  Surgeon: Reyes Jerilynn Sage, MD;  Location: [facility] CATH;  Service: Cardiology    PR RIGHT HEART CATH O2 SATURATION & CARDIAC OUTPUT N/A 01/09/2021    Procedure: Right Heart Catheterization;  Surgeon: Norleen Deward Grumbling, MD;  Location: [facility] CATH;  Service: Cardiology   [3]   Family History  Problem Relation Age of Onset    Hypertension Mother     Cancer Father         COLON CANCER    No Known Problems Sister     No Known Problems Sister     No Known Problems Brother     No Known Problems Brother     No Known Problems Maternal Aunt     No Known Problems Maternal Uncle     No Known Problems Paternal Aunt     No Known Problems Paternal Uncle     No Known Problems  Maternal Grandmother     No Known Problems Maternal Grandfather     No Known Problems Paternal Grandmother     No Known Problems Paternal Grandfather     No Known Problems Other     Anesthesia problems Neg Hx     Broken bones Neg Hx     Clotting disorder Neg Hx     Collagen disease Neg Hx     Diabetes Neg Hx     Dislocations Neg Hx     Fibromyalgia Neg Hx     Gout Neg Hx     Hemophilia Neg Hx     Osteoporosis Neg Hx     Rheumatologic disease Neg Hx     Scoliosis Neg Hx     Severe sprains Neg Hx     Sickle cell anemia Neg Hx     Spinal Compression Fracture Neg Hx     GU problems Neg Hx     Kidney cancer Neg Hx     Prostate cancer Neg Hx

## 2025-01-24 NOTE — Consults (Signed)
 Clinical Pharmacy Non-Formulary Home Medication Evaluation Note     Team is requesting continuation of the patient's non-formulary home medication tafamidis . Per PolicyStat ID: [id number] (Medication Management: Patient-Supplied Medications,) the continued use of these medications during hospital admission was evaluated by a pharmacist.      Recommendation: Continuation of non-formulary home medication tafamidis  is clinically necessary and will be identified by pharmacy for continuation. Per policy, patient-supplied medications must be supplied by the patient for the duration of the encounter.      Rationale: This medication was approved for inpatient use by JINNY Tanda Moats (clinical pharmacist) and was determined clinically necessary for the following reason(s): continuation of home therapy. On each encounter, clinical appropriateness will be evaluated.    This information has been communicated to the ordering clinician. Pharmacy will continue to follow patient with team. Please contact the service pharmacist if further immediate assistance is required.     Thank you,     DOROTHA Tanda Moats, Pharm D, BCPS, BCGP

## 2025-01-24 NOTE — Hospital Course (Addendum)
 High-Level Summary   Barbara Huber is an 84 year old female with a history of COPD, chronic hypoxic respiratory failure, cardiac amyloidosis, chronic heart failure with preserved ejection fraction ([name]), and stage IV chronic kidney disease ([name]), who was admitted with generalized weakness and acute kidney injury (AKI) in the context of recent respiratory infection, poor oral intake, and weight loss.    [facility] Hospital Course    **1. Generalized Weakness and Functional Decline**   She presented with acute worsening of baseline weakness, particularly in her legs, resulting in inability to ambulate independently and increased need for assistance with activities of daily living (ADLs). This was associated with poor oral intake, subjective wt loss, weight loss (down to 164 lbs from a prior dry wt of 166 at dc 12/27), and recent viral URI, followed by bacterial infection. Physical and occupational therapy evaluations revealed significant deficits in strength, endurance, and balance, with high fall risk and need for moderate to maximal assistance for transfers and ADLs. She required supervision for all mobility and was recommended for ongoing low-intensity PT/OT post-discharge. She declined recommendation of SNF for PT/OT and opted to dc home.  Her functional status was below baseline, and she was primarily using a rollator for short distances and a wheelchair for community mobility prior to admission.    **2.Chronic Kidney Disease (Stage IV)**   She has a history of [name] stage IV with baseline creatinine typically 2-2.6 mg/dL, and presented with a creatinine of 2.64 mg/dL, which was within her recent range but technically met criteria for AKI with 1/10 Cr being 1.8 in the setting of dilution/volume overload. Euvolemic on exam. Diuretics were held initially and then resumed once renal function stabilized. Nephrotoxic agents were avoided and medications were renally dosed as appropriate.    **3. Cardiac Amyloidosis, [name], and Cardiorenal Syndrome**   She has a history of cardiac amyloidosis and [name], with prior admissions for volume overload and cardiorenal syndrome. On admission, she appeared euvolemic, with weight slightly below estimated dry weight and no significant edema. She received a 500 mL IV fluid bolus in the ED, resulting in no change to renal function. Diuretics were held initially, resumed when Cr began to elevate. Cr improved after resuming diuresis, implying cardiorenal etiology. She is not on anticoagulation due to anemia. Tafamidis  was continued during hospitalization.    **4. COPD, Chronic Hypoxic Respiratory Failure, Pulmonary Hypertension, and Recent Respiratory Infection**   She has chronic hypoxic respiratory failure (on 3L O2 at baseline), COPD, and pulmonary hypertension. She was recently treated for pneumonia and coronavirus NL63 infection with multiple antibiotics, including Augmentin , doxycycline , levofloxacin  (discontinued due to neuropathic pain), and minocycline . Outpatient sputum culture grew Stenotrophomonas maltophilia; minocycline  was continued as the preferred oral agent given renal considerations. She took 2 days of prescribed course and stopped due to side effect of general malaise.  She had persistent cough but no increased oxygen  requirement or respiratory distress during admission. Airway clearance with albuterol  and hypertonic saline was initiated. Pulmonology was consulted and recommended a 10-day course of minocycline  if tolerated, airway clearance, and SLP evaluation for dysphagia. She remained on home oxygen  and inhaled therapies, with no evidence of worsening hypoxia or increased oxygen  needs.    **5. Deconditioning and Rehabilitation Needs**   Her hospitalization was complicated by significant deconditioning, with impaired mobility, endurance, and ADL performance. She required moderate to maximal assistance for transfers and ambulation, and total assistance for toileting and lower body dressing. She was recommended for ongoing PT and OT at 5x low, however  they declined this and opted for home with [facility] post-discharge    **6. Nutrition and Weight Loss**   Daughter concerned about wt loss, reporting 7 lb loss, however this was based on wt obtained in ED, which was inaccurate. Pt's dry wt at dc on 12/27 was 166 lbs, wt this admission 164 lbs. Weight of 170 lbs was in the setting of volume overload.    May have mild protein-calorie malnutrition in the context of acute illness. Oral nutritional supplements were recommended, and her intake improved during admission. She was encouraged to meet at least 75% of nutritional needs via meals and supplements.  Wt Readings from Last 6 Encounters:   01/24/25 74.4 kg (164 lb 0.4 oz)   01/08/25 74.4 kg (164 lb)   01/06/25 77.1 kg (170 lb)   12/25/24 77.1 kg (170 lb)   12/23/24 77.2 kg (170 lb 1.6 oz)   12/13/24 75.3 kg (166 lb)        **7. Chronic Cystitis and Other Comorbidities** Questionable dx of DM   Her history includes chronic cystitis, diabetes, hypertension, atrial fibrillation, and prior anemia. These were not acutely exacerbated during this admission but remain relevant to her overall care and medication management.  Pt not on medications for DM, glucose normal throughout stay. Most recent A1C 4.6%, has not     **8. Sleep Apnea**   She has a history of sleep apnea and uses AVAPS at night as part of her home respiratory regimen.    **9. Safety and Discharge Planning**   She was maintained on fall precautions throughout her stay. Discharge planning included arrangements for home health services, PT/OT, and caregiver support. She met Medicare homebound criteria and was provided with a list of post-acute care providers. Her daughter is her healthcare power of attorney and primary caregiver.    **10. Code Status**   She is full code, with healthcare power of attorney documentation on file.    **11. Medication Allergies**   She has documented allergies to nitrofurantoin (anaphylaxis) and atorvastatin  (muscle pain).    **12. 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    **13. Hospital Course Complications**   No hospital-acquired complications or injuries were reported during this admission.    **14. Advance Care Planning**   Her goals of care and code status were reviewed and confirmed with her and her daughter during admission.    **15. R lateral knee pain.   No known injury. Began at least a week ago. Xray without acute injury, no effusion, mild tricompartmental arthritis. Home PT arranged. Follow up with PCP.    **16. Discharge Needs**   She will require ongoing PT/OT, home health, and caregiver support due to persistent functional deficits and high fall risk.     **17. Asymmetric smile**  During SLP assessment, concern raised over uneven smile. Neuro exam otherwise non-focal, mentation and speech intact. Daughter thinks the difference is new, nursing suspects it's been present on admission. Pt doesn't have a true drop on exam, just a very slight asymmetry with wide smile. I have extremely low concern that this is a neurologic change of import.

## 2025-01-25 LAB — CBC
HEMATOCRIT: 33.5 % — ABNORMAL LOW (ref 34.0–44.0)
HEMOGLOBIN: 11.4 g/dL (ref 11.3–14.9)
MEAN CORPUSCULAR HEMOGLOBIN CONC: 33.9 g/dL (ref 32.0–36.0)
MEAN CORPUSCULAR HEMOGLOBIN: 31 pg (ref 25.9–32.4)
MEAN CORPUSCULAR VOLUME: 91.5 fL (ref 77.6–95.7)
MEAN PLATELET VOLUME: 9 fL (ref 6.8–10.7)
PLATELET COUNT: 118 10*9/L — ABNORMAL LOW (ref 150–450)
RED BLOOD CELL COUNT: 3.66 10*12/L — ABNORMAL LOW (ref 3.95–5.13)
RED CELL DISTRIBUTION WIDTH: 13.9 % (ref 12.2–15.2)
WBC ADJUSTED: 8.2 10*9/L (ref 3.6–11.2)

## 2025-01-25 LAB — BASIC METABOLIC PANEL
ANION GAP: 15 mmol/L — ABNORMAL HIGH (ref 5–14)
BLOOD UREA NITROGEN: 66 mg/dL — ABNORMAL HIGH (ref 9–23)
BUN / CREAT RATIO: 26
CALCIUM: 9.4 mg/dL (ref 8.7–10.4)
CHLORIDE: 98 mmol/L (ref 98–107)
CO2: 23.6 mmol/L (ref 20.0–31.0)
CREATININE: 2.55 mg/dL — ABNORMAL HIGH (ref 0.55–1.02)
EGFR CKD-EPI (2021) FEMALE: 18 mL/min/{1.73_m2} — ABNORMAL LOW (ref >=60)
GLUCOSE RANDOM: 87 mg/dL (ref 70–179)
POTASSIUM: 4.5 mmol/L (ref 3.4–4.8)
SODIUM: 137 mmol/L (ref 135–145)

## 2025-01-25 LAB — MAGNESIUM: MAGNESIUM: 2.6 mg/dL (ref 1.6–2.6)

## 2025-01-25 LAB — PHOSPHORUS: PHOSPHORUS: 4.8 mg/dL (ref 2.4–5.1)

## 2025-01-25 MED ADMIN — heparin (porcine) 5,000 unit/mL injection 5,000 Units: 5000 [IU] | SUBCUTANEOUS | @ 19:00:00

## 2025-01-25 MED ADMIN — heparin (porcine) 5,000 unit/mL injection 5,000 Units: 5000 [IU] | SUBCUTANEOUS | @ 03:00:00

## 2025-01-25 MED ADMIN — heparin (porcine) 5,000 unit/mL injection 5,000 Units: 5000 [IU] | SUBCUTANEOUS | @ 12:00:00

## 2025-01-25 MED ADMIN — cholecalciferol (vitamin D3 25 mcg (1,000 units)) tablet 25 mcg: 25 ug | ORAL | @ 14:00:00

## 2025-01-25 MED ADMIN — minocycline (MINOCIN) capsule 200 mg: 200 mg | ORAL | @ 03:00:00 | Stop: 2025-01-31

## 2025-01-25 MED ADMIN — minocycline (MINOCIN) capsule 200 mg: 200 mg | ORAL | @ 14:00:00 | Stop: 2025-01-31

## 2025-01-25 MED ADMIN — guaiFENesin (ROBITUSSIN) oral syrup: 200 mg | ORAL | @ 03:00:00

## 2025-01-25 MED ADMIN — midodrine (PROAMATINE) tablet 5 mg: 5 mg | ORAL | @ 19:00:00

## 2025-01-25 MED ADMIN — midodrine (PROAMATINE) tablet 5 mg: 5 mg | ORAL | @ 14:00:00

## 2025-01-25 MED ADMIN — midodrine (PROAMATINE) tablet 5 mg: 5 mg | ORAL | @ 23:00:00

## 2025-01-25 MED ADMIN — albuterol (PROVENTIL HFA;VENTOLIN HFA) 90 mcg/actuation inhaler 2 puff: 2 | RESPIRATORY_TRACT | @ 21:00:00

## 2025-01-25 MED ADMIN — torsemide (DEMADEX) tablet 100 mg: 100 mg | ORAL | @ 14:00:00

## 2025-01-25 MED ADMIN — gabapentin (NEURONTIN) capsule 100 mg: 100 mg | ORAL | @ 02:00:00

## 2025-01-25 MED ADMIN — gabapentin (NEURONTIN) capsule 100 mg: 100 mg | ORAL | @ 14:00:00

## 2025-01-25 MED ADMIN — spironolactone (ALDACTONE) split tablet 12.5 mg: 12.5 mg | ORAL | @ 14:00:00

## 2025-01-25 MED ADMIN — fluticasone furoate-vilanterol (BREO ELLIPTA) 100-25 mcg/dose inhaler 1 puff: 1 | RESPIRATORY_TRACT | @ 14:00:00

## 2025-01-25 MED ADMIN — acetaminophen (TYLENOL) tablet 1,000 mg: 1000 mg | ORAL | @ 02:00:00

## 2025-01-25 MED ADMIN — amiodarone (PACERONE) tablet 200 mg: 200 mg | ORAL | @ 14:00:00

## 2025-01-25 MED ADMIN — oxyCODONE (ROXICODONE) immediate release tablet 5 mg: 5 mg | ORAL | @ 21:00:00 | Stop: 2025-01-31

## 2025-01-25 MED ADMIN — oxyCODONE (ROXICODONE) immediate release tablet 5 mg: 5 mg | ORAL | @ 03:00:00 | Stop: 2025-01-31

## 2025-01-25 MED ADMIN — oxyCODONE (ROXICODONE) immediate release tablet 5 mg: 5 mg | ORAL | @ 14:00:00 | Stop: 2025-01-31

## 2025-01-25 MED ADMIN — sodium chloride 3 % NEBULIZER solution 4 mL: 4 mL | RESPIRATORY_TRACT | @ 21:00:00

## 2025-01-25 MED ADMIN — **tafamidis cap 61 mg Patient Own Med**: 61 mg | ORAL | @ 14:00:00 | NDC 00069873030

## 2025-01-25 MED ADMIN — lidocaine (ASPERCREME) 4 % 1 patch: 1 | TRANSDERMAL | @ 14:00:00

## 2025-01-25 MED ADMIN — vitamin A-3,000 mcg RAE (10,000 UNIT) capsule 3,000 mcg of RAE: 3000 ug | ORAL | @ 14:00:00

## 2025-01-25 MED ADMIN — ipratropium-albuterol (DUO-NEB) 0.5-2.5 mg/3 mL nebulizer solution 3 mL: 3 mL | RESPIRATORY_TRACT | @ 14:00:00 | Stop: 2025-01-25

## 2025-01-25 MED ADMIN — ipratropium-albuterol (DUO-NEB) 0.5-2.5 mg/3 mL nebulizer solution 3 mL: 3 mL | RESPIRATORY_TRACT | @ 10:00:00 | Stop: 2025-01-25

## 2025-01-25 MED ADMIN — ipratropium-albuterol (DUO-NEB) 0.5-2.5 mg/3 mL nebulizer solution 3 mL: 3 mL | RESPIRATORY_TRACT | @ 01:00:00

## 2025-01-25 MED ADMIN — pantoprazole (Protonix) EC tablet 20 mg: 20 mg | ORAL | @ 14:00:00

## 2025-01-25 MED ADMIN — torsemide (DEMADEX) tablet 40 mg: 40 mg | ORAL | @ 19:00:00

## 2025-01-25 NOTE — Consults (Addendum)
 Adult Nutrition Assessment Note    Visit Type: RN Consult  Reason for Visit: Per Admission Nutrition Screen (Adult)    NUTRITION INTERVENTIONS and RECOMMENDATION     Recommend Ensure Plus HP BID  Weigh weekly  Document PO intakes    NUTRITION ASSESSMENT     Current nutrition therapy is appropriate and progressing toward meeting nutritional needs at this time.   Patient would benefit from start of oral supplement to better meet nutritional needs.  100% past two intakes noted; daughter reports patient eating 50% of 2-3 meals daily. Does not really like Ensure or Boost but agreeable to trying supplement during admission until PO picks up and due to weight loss without trying.    NUTRITIONALLY RELEVANT DATA     HPI & PMH:   84 y.o. female who is presenting to [facility] with AKI (acute kidney injury), in the setting of the following pertinent/contributing co-morbidities: COPD, chronic hypoxic respiratory failure, cardiac amyloidosis, chronic [name], [name]     Nutrition History:   01/29- patient's daughter reports patient with decreased appetite and intake over two weeks. Patient eating less than normal. Has had some nausea without vomiting.    Medications:  Nutritionally pertinent medications reviewed and evaluated for potential food and/or medication interactions.    Protonix , Aldactone , Demadex , Ferrous sulfate , Vitamin D3, Vitamin A  10000 unit  Labs:   Nutritionally pertinent labs reviewed.     Nutritional Needs:   Healthy balance of carbohydrate, protein, and fat.     Anthropometric Data:  Height: 167.6 cm (5' 6)   Admission weight: 74.4 kg (164 lb 0.4 oz)  Last recorded weight: 74.4 kg (164 lb 0.4 oz) (01/24/25)  IBW: 59.01 kg  BMI: Body mass index is 26.47 kg/m??.   Usual Body Weight: 170 lb  Weight Assessment: patient's daughter reports patient weighing around 159 lb last week during past doctor appt. 11 lb/6.5% loss in one month per reported weights- significant  Wt Readings from Last 20 Encounters:   01/24/25 74.4 kg (164 lb 0.4 oz)   01/08/25 74.4 kg (164 lb)   01/06/25 77.1 kg (170 lb)   12/25/24 77.1 kg (170 lb)   12/23/24 77.2 kg (170 lb 1.6 oz)   12/13/24 75.3 kg (166 lb)   11/03/24 75.2 kg (165 lb 11.2 oz)   10/18/24 75.8 kg (167 lb)   10/16/24 75.8 kg (167 lb)   09/14/24 75.3 kg (166 lb)   09/04/24 74.3 kg (163 lb 12.8 oz)   08/02/24 76.7 kg (169 lb)   08/02/24 76.2 kg (168 lb)   07/31/24 76.6 kg (168 lb 14.4 oz)   07/20/24 77.5 kg (170 lb 12.8 oz)   07/18/24 77.1 kg (169 lb 14.4 oz)   07/11/24 77.1 kg (169 lb 14.4 oz)   07/10/24 76.4 kg (168 lb 6.4 oz)   07/05/24 76 kg (167 lb 8 oz)   07/04/24 75.3 kg (166 lb)       Malnutrition Assessment:  Malnutrition Assessment using AND/ASPEN or GLIM Clinical Characteristics:    Non-severe (Moderate) Protein-Calorie Malnutrition in the context of acute illness or injury (01/25/25 1706)  Energy Intake: < 75% of estimated energy requirement for > 7 days  Interpretation of Wt. Loss: > or equal to 5% x 1 month  Malnutrition Score: 2            Nutrition Focused Physical Exam:  Nutrition Focused Physical Exam:  Fat Areas Examined  Upper Arm: No loss      Muscle Areas Examined  Temple: Mild loss  Clavicle: No loss  Acromion: No loss  Dorsal Hand: Mild loss  Patellar: No loss  Anterior Thigh: No loss  Posterior Calf: No loss              Nutrition Evaluation  Overall Impressions: No muscle loss, No fat loss (01/25/25 1451)     Care plan:  Completed    Current Nutrition:  Oral intake   Nutrition Orders            Supplement Adult; Ensure Plus High Protein (High Calorie/High Protein); # of Products PER Serving: 1 2xd Meals starting at 01/30 0800    Nutrition Therapy Regular/House starting at 01/28 1150          Nutritionally Pertinent Allergies, Intolerances, Sensitivities, and/or Cultural/Religious Restrictions:  none identified per chart review at this time     GOALS and EVALUATION     Patient to meet 75% or greater of nutritional needs via combination of meals, snacks, and/or oral supplements within admission.  - New    Motivation, Barriers, and Compliance:  Evaluation of motivation, barriers, and compliance pending at this time due to clinical status.     Discharge Planning:   Monitor for potential discharge needs with multi-disciplinary team.         Follow-Up Parameters:   1-2 times per week (and more frequent as indicated)    Arleta Blanch, RD

## 2025-01-25 NOTE — Consults (Signed)
 OCCUPATIONAL THERAPY  Evaluation (01/25/25 1026)    Patient Name:  Barbara Huber       Medical Record Number: [id number]     Date of Birth: Nov 27, 1941  Sex: Female      Post-Discharge Occupational Therapy Recommendations: 5x weekly, Low intensity          Equipment Recommendation  OT DME Recommendations: Defer to post acute       OT Treatment Diagnosis: Generalized muscle weakness, Limitation of activities due to disability, Need for assistance with personal care, Reduced mobility, Unsteadiness on feet         Assessment  Assessment: Barbara Huber is a 84 y.o. female with past medical history of COPD, chronic respiratory failure w/ hypoxia (on 3 L [place] at baseline), OSA, hereditary amyloidosis, [name], A-fib, HTN, [name] stage IV, and T2DM presenting for weakness. Pt presents to OT with significant deficits in functional strength, ROM, activity tolerance, and balance; in addition to deficits in dexterity and sensation; impacting independence and safety in ADLs; including functional mobility. With consideration of Pt's occupational profile and history review, assessment of occupational performance deficits, and level of clinical decision making involved, Pt presents as a moderate complexity evaluation. Due to Pt currently performing ADLs below functional baseline, Pt would benefit from skilled acute OT. Post-acute OT needs: 5x-low. DME: defer to post-acute.    Problem List: Decreased strength, Decreased range of motion, Decreased activity tolerance, Decreased endurance, Decreased mobility, Impaired ADLs, Impaired balance, Fall risk, Impaired sensation, Pain, Gait deviation  Personal Factors/Comorbidities (Occupational Profile and History Review): Expanded (Moderate)  Assessment of Occupational Performance : Balance, Endurance, Mobility, Strength, Sensation, Dexterity  Clinical Decision Making: Moderate Complexity    Skilled Interventions Performed: ADL retraining, Balance activities, Bed mobility, Education - Patient, Education - Family / caregiver, Functional cognition, Endurance activities, Functional mobility, Safety education, Postular / Proximal stability, Transfer training, Therapeutic exercise  Today's Interventions: Educated Pt/daughter on role of OT in acute setting. Engaged Pt in ADL occupations, including functional mobility, to increase functional strength, endurance, activity tolerance, and balance. Pt performed self-feeding with set-up (A) at bed level. Pt performed supine - sit t/f with HOB elevated with min (A) for functional strength of BLE, I.e. progression of hips to EOB. Pt required min (A) for functional balance, sitting at EOB, unsupported. Pt performed sit - stand t/f at EOB with mod (A) for functional strength, using RW. Pt performed x1 ~5 ft functional ambulation with min (A) for balance, using RW. Pt performed sit <> stand t/f's at [facility] with max (A) for functional strength and eccentric control, Pt requiring physical guiding for stand - sit t/f, initially missing toilet seat. Pt performed toilet hygiene tasks with total (A), standing, with use of RW. Pt performed additional x1 ~5 ft functional ambulation, with mod (A); using RW; and stand - sit t/f to recliner with max (A); again for eccentric control and physical guiding. Pt demonstrated strong kyphotic posture throughout mobility, requiring additional physical assistance at bilateral hips throughout. Educated Pt/daughter on recommended OT acute/post-acute POC; including recommendation for additional post-acute low-intensity OT. Pt/daughter demonstrated strong understanding; yet described ambivalence related to SNF setting, reporting interest in continuing to assess decision-making.    Activity Tolerance During Today's Session  Tolerated treatment well    Plan  Planned Frequency of Treatment: Plan of Care Initiated: 01/25/25  1-2x per day Weekly Frequency: 3-4 days per week  Planned Treatment Duration: 02/08/25    Planned Interventions:  Home Exercise Program, Education (Patient/Family/Caregiver), Self-Care/Home Training, Therapeutic Activity,  Therapeutic Exercise      GOALS:   Patient and Family Goals: Get stronger    Short Term:   SHORT GOAL #1: Pt will complete toilet transfer and clothing management with min (A); and toilet hygiene with max (A) and LRAD.   Time Frame : 2 weeks  SHORT GOAL #2: Pt will complete UB dressing, with set-up (A), seated or at bed level.   Time Frame : 2 weeks  SHORT GOAL #3: Pt will complete at least 2 consecutive grooming tasks, at bed level with set-up (A).   Time Frame : 2 weeks           Long Term Goal #1: Pt to complete toileting, dressing, and grooming routines; consecutively, all with gross min (A) and LRAD.  Time Frame: 4 weeks    Prognosis:  Fair  Positive Indicators:  PLOF; caregiver support  Barriers to Discharge: Endurance deficits, Functional strength deficits, Inability to safely perform ADLS, Impaired Balance    Subjective  Medical Updates Since Last Visit/Relevant PMH Affecting Clinical Decision Making:    Prior Functional Status PTA; Pt's caregiver (daughter) reports Pt receives max (A) for all ADLs, including moving to sponge bathing in bed v. showering; and primarily using BSC in room. Pt uses rollator in home for short distances; and transport wc when traveling outside of the home. Pt denies additional falls, aside from per chart review, fall occurring ~1 month prior. Pt on 3L 02 at baseline.    Living Situation  Living environment: House  Lives With: Daughter, Other, Care Staff (Son-in-law; Additional care staff available)  Home Living: One level home, Ramped entrance, Bedside commode, Walk-in shower, Grab bars in shower, Built-in shower seat, Shower chair with back, Handicapped height toilet  Caregiver Identified?: Yes (Daughter; son-in-law; additional hired caregivers)  Caregiver Availability: 24 hours  Caregiver Ability: Assist with ADLs     Equipment available at home: [employer], Rollator, Bedside commode    Medical Tests / Procedures: Reviewed in EPIC       Patient / Caregiver reports: Caregiver - We help with everything. We have gone to more of bathing in the bed versus shower.      Past Medical History[1] Social History     Tobacco Use    Smoking status: Former     Current packs/day: 0.00     Average packs/day: 0.3 packs/day     Types: Cigarettes     Quit date: 2019     Years since quitting: 7.0    Smokeless tobacco: Never    Tobacco comments:     Quit a few years ago   Substance Use Topics    Alcohol use: No      Past Surgical History[2] Family History[3]     Nitrofurantoin and Lipitor  [atorvastatin ]     Objective Findings  Precautions / Restrictions  Falls precautions      Weight Bearing                   Required Braces or Orthoses  Non-applicable         Communication Preference  Verbal       Pain  Pt endorsed 7/10 pain in bilateral hands/feet at rest    Equipment / Environment  Vascular access (PIV, TLC, Port-a-cath, PICC), Supplemental oxygen , Telemetry    Cognition   Orientation Level:  Oriented x 4   Arousal/Alertness:  Appropriate responses to stimuli   Attention Span:  Appears intact   Memory:  Appears intact, Appropriate for age  Following Commands:  Follows one-step commands   Safety Judgment:  Good awareness of safety precautions   Awareness of Errors and Problem Solving:  Assistance required to identify errors made, Assistance required to generate solutions   Comments:      Vision / Hearing   Vision: No acute deficits identified, Glasses present, Wears glasses all the time     Hearing: No deficit identified, Hearing aids not present         Hand Function:  Right Hand Function: Right hand function impaired  Right Hand Impairment: grip strength poor  Left Hand Function: Left hand function impaired  Left Hand Impairment: grip strength poor  Hand Dominance: Right    Skin Inspection:  Skin Inspection: Intact where visualized    Face/Cervical ROM:  Face ROM: WFL  Cervical ROM: WFL    ROM / Strength:  UE ROM/Strength: Left Impaired/Limited, Right Impaired/Limited  RUE Impairment: Reduced strength, Limited AROM  LUE Impairment: Reduced strength, Limited AROM  UE ROM/ Strength Comment: Limited ROM to 90D; reduced strength particularly in LUE  LE ROM/Strength: Left Impaired/Limited, Right Impaired/Limited  RLE Impairment: Limited AROM, Reduced strength  LLE Impairment: Limited AROM, Reduced strength  LE ROM/ Strength Comment: Reduced strength particularly in RLE    Coordination:  Coordination: WFL    Sensation:  RUE Sensation: RUE impaired  RUE Sensation Impairment: chronic peripheral neuropathy  LUE Sensation: LUE impaired  LUE Sensation Impairment: chronic peripheral neuropathy  RLE Sensation: RLE impaired  RLE Sensation Impairment: chronic peripheral neuropathy  LLE Sensation: LLE impaired  LLE Sensation Impairment: chronic peripheral neuropathy  Sensory/ Proprioception/ Stereognosis comments: Sensation impaired bilateral knees and below; elbows and below    Balance:  Static Sitting-Level of Assistance: Minimum assistance  Dynamic Sitting-Level of Assistance: Minimum assistance    Static Standing-Level of Assistance: Minimum assistance  Dynamic Standing - Level of Assistance: Moderate assistance    Functional Mobility  Transfers: Mod assist, Max assist    Ambulation  Level of Assistance: Minimal assist, patient does 75% or more  Assistive Device: Four wheel walker    ADLs  Feeding : Set Up Assist, Performed at bed level  Toileting: Total Assist, Performed standing  LB Dressing: Total Assist, Performed at bed level    IADLs: NT    Vitals / Orthostatics  Vitals/Orthostatics: NAD throughout eval/tx; 02 remained >94% throughout mobility    Patient at end of session: All needs in reach, Lines intact, Friends/Family present, In bed, Notified Nurse     Occupational Therapy Session Duration  OT Individual [mins]: 45           AM-PAC Daily Activity Assessment  Lower Body Dressing assistance needs: Unable to do/total assistance - Total Dependent Assist  Bathing assistance needs: A lot - Maximum/Moderate Assistance  Toileting assistance needs: A lot - Maximum/Moderate Assistance  Upper Body Dressing assistance needs: A lot - Maximum/Moderate Assistance  Personal Grooming assistance needs: A lot - Maximum/Moderate Assistance  Eating Meals assistance needs: A Little - Minimal/Contact Guard Assist/Supervision      Daily Activity Score: 12    Score (in points): % of Functional Impairment, Limitation, Restriction  5: 100% impaired, limited, restricted  6-7: At least 80%, but less than 100% impaired, limited restricted  8-11: At least 60%, but less than 80% impaired, limited restricted  12-16: At least 40%, but less than 60% impaired, limited restricted  17-18: At least 20%, but less than 40% impaired, limited restricted  19: At least 1%, but less than 20% impaired,  limited restricted  20: 0% impaired, limited restricted    'AM-PAC' forms are Copyright protected by [employer] [facility]       I attest that I have reviewed the above information.  Signed: Estefana Moment, OT  Filed 01/25/2025                 [1]   Past Medical History:  Diagnosis Date    Acute exacerbation of CHF (congestive heart failure) (CMS-HCC) 08/19/2023    Acute exacerbation of CHF (congestive heart failure) (CMS-HCC) 08/19/2023    Acute kidney injury superimposed on chronic kidney disease 10/11/2020    Acute on chronic diastolic (congestive) heart failure (CMS-HCC) 08/23/2020    Acute on chronic diastolic congestive heart failure (CMS-HCC) 08/23/2020    AKI (acute kidney injury) 04/14/2015    Lab Results  Component  Value  Date     CREATININE  1.90 (H)  06/12/2021     Had a bump in her creatinine when she was taking her diuretics every day.  She is currently taking 40 mg daily of torsemide  and 50 mg of spironolactone .  Her volume status is fragile.  Previously when she stopped her diuretic she becomes short of breath.  Plan: We will check her BMP today.  We will likely have to go to 40    AKI (acute kidney injury) 04/14/2015    Lab Results      Component    Value    Date           CREATININE    1.90 (H)    06/12/2021     Had a bump in her creatinine when she was taking her diuretics every day.  She is currently taking 40 mg daily of torsemide  and 50 mg of spironolactone .  Her volume status is fragile.  Previously when she stopped her diuretic she becomes short of breath.  Plan: We will check her BMP today.  We will likel    Anemia 08/22/2019    Iron  deficiency anemia          Lab Results      Component    Value    Date           WBC    9.2    03/21/2021           RBC    3.67 (L)    03/21/2021           HGB    9.9 (L)    03/21/2021           HCT    30.7 (L)    03/21/2021           MCV    83.7    03/21/2021           MCH    26.9    03/21/2021           MCHC    32.1    03/21/2021           RDW    21.9 (H)    03/21/2021           PLT    318     Arthritis     Calculus of kidney     Calculus of ureter     CHF (congestive heart failure) (CMS-HCC)     Chronic atrial fibrillation    (CMS-HCC) 07/20/2019    COPD (chronic obstructive pulmonary disease) (CMS-HCC)     Diabetes (CMS-HCC)  Gangrenous cholecystitis 10/11/2020    Generalized edema  06/17/2021    GERD (gastroesophageal reflux disease)     Hydronephrosis     Hypertension     Hyponatremia 10/11/2020    Intermediate coronary syndrome (CMS-HCC) 03/13/2014    Lower extremity edema 09/28/2020    Lumbar stenosis     Microscopic hematuria     Nausea alone     Nephrolithiasis 04/17/2016    Neuropathy     Nocturia     Nocturia 07/01/2017    Other chronic cystitis     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  See plans for anemia, AKI  We are also tapering her gabapentin .  Currently on 300 mg 3 times daily.  We will decrease it to twice daily, and then nightly.  She states she is not having any recurrence of her pain.    Pulmonary hypertension    (CMS-HCC)     Renal colic Shortness of breath 04/28/2020    Sleep apnea     Unstable angina pectoris    (CMS-HCC) 03/13/2014   [2]   Past Surgical History:  Procedure Laterality Date    BACK SURGERY  1995    CARPAL TUNNEL RELEASE Left 2014    HYSTERECTOMY  1971    IR INSERT CHOLECYSTOSTOMY TUBE PERCUTANEOUS  10/02/2020    IR INSERT CHOLECYSTOSMY TUBE PERCUTANEOUS 10/02/2020 Ivonne Butler Seed, MD IMG VIR [facility]&V [facility]    LUMBAR DISC SURGERY      PR REMOVAL GALLBLADDER N/A 10/06/2020    Procedure: CHOLECYSTECTOMY;  Surgeon: Curtistine Mira Dunnings, MD;  Location: MAIN OR [facility];  Service: Trauma    PR RIGHT HEART CATH O2 SATURATION & CARDIAC OUTPUT N/A 09/30/2020    Procedure: Right Heart Catheterization;  Surgeon: Reyes Jerilynn Sage, MD;  Location: [facility] CATH;  Service: Cardiology    PR RIGHT HEART CATH O2 SATURATION & CARDIAC OUTPUT N/A 01/09/2021    Procedure: Right Heart Catheterization;  Surgeon: Norleen Deward Grumbling, MD;  Location: [facility] CATH;  Service: Cardiology   [3]   Family History  Problem Relation Age of Onset    Hypertension Mother     Cancer Father         COLON CANCER    No Known Problems Sister     No Known Problems Sister     No Known Problems Brother     No Known Problems Brother     No Known Problems Maternal Aunt     No Known Problems Maternal Uncle     No Known Problems Paternal Aunt     No Known Problems Paternal Uncle     No Known Problems Maternal Grandmother     No Known Problems Maternal Grandfather     No Known Problems Paternal Grandmother     No Known Problems Paternal Grandfather     No Known Problems Other     Anesthesia problems Neg Hx     Broken bones Neg Hx     Clotting disorder Neg Hx     Collagen disease Neg Hx     Diabetes Neg Hx     Dislocations Neg Hx     Fibromyalgia Neg Hx     Gout Neg Hx     Hemophilia Neg Hx     Osteoporosis Neg Hx     Rheumatologic disease Neg Hx     Scoliosis Neg Hx     Severe sprains Neg Hx     Sickle cell anemia  Neg Hx     Spinal Compression Fracture Neg Hx     GU problems Neg Hx Kidney cancer Neg Hx     Prostate cancer Neg Hx

## 2025-01-25 NOTE — Consults (Signed)
 PULMONARY CONSULT  NOTE      Patient: Barbara Huber(1941/10/01)  Reason for consultation: Barbara Huber is a 84 y.o. female who is seen in consultation at the request of Lestine Raeann Haws, PA for comprehensive evaluation of cough.    Assessment and Recommendations:      Principal Problem:    AKI (acute kidney injury)  Active Problems:    COPD (chronic obstructive pulmonary disease) (CMS-HCC)    Sleep apnea in adult    Chronic cystitis    Chronic respiratory failure with hypoxia    (CMS-HCC)    Chronic kidney disease ([name]), stage IV (severe)    (CMS-HCC)    Pulmonary hypertension    (CMS-HCC)    Chronic heart failure with preserved ejection fraction ([name]) (CMS-HCC)    Cardiac amyloidosis (CMS-HCC)    Weakness    Barbara Huber is a 84 y.o. female with PMH chronic hypoxic respiratory failure (on 3L home O2) 2/2 COPD and pulmonary hypertension (largely group 2/3), cardiac amyloidosis, and [name] who was admitted with AKI on [name]. Pulmonology was consulted for persistent cough in the setting of lower respiratory culture results with stenotrophomonas.     I suspect that the etiology of her cough is likely multifactorial 2/2 post-viral cough from recent coronavirus NL 63 infection with potential contributions from aspiration, given esophageal and bronchial findings on CT chest as well as an additional component of stenotrophomonas PNA versus exacerbation of likely underlying bronchiectasis.    For the stenotrophomonas, unfortunately minocycline  remains her best oral treatment option given the risk of renal dysfunction with bactrim  and the side effects and poor susceptibility to levaquin . That being said, it's not clear just how much the organism is contributing to her overall symptoms so if she cannot tolerate it, it would be reasonable to stop and rely more heavily on airway clearance.     Recommendations:  - Suspect that 4+ GNRs on lower respiratory gram stain will likely yield stenotrophomonas, recommend treatment for presumed bronchiectasis exacerbation with 10 days of minocycline  as long as patient can tolerate this  - Please provide patient with instructions as discharge that if she cannot tolerate the minocycline  due to side effects, that she can stop it but should let Dr. Janie and Dr. Myrna know via Mychart  - Trial of airway clearance with pre-treatment albuterol  (2 puffs) followed by 3% hypertonic saline in-line with [place]  - If she tolerates this well, she should be discharged with instructions to continue this at home at least twice a day  - Recommend re-evaluation with SLP given her reports of dysphagia with liquids and a debris-filled trachea on CT chest  - Continue ICS/LABA + LAMA  - Recommend changing SABA/SAMA to PRN or SAMA only q 6 hours to avoid excessive SABA effect      We appreciate the opportunity to assist in the care of this patient.  We will continue to follow along with you. Please page [phone] with any questions.    Marcel DELENA Saxon, MD    Attestation      I directly provided consultative services as documented in this note and I personally spent 45 minutes face-to-face and non face-to-face in the care of this patient, which includes all pre, intra and post visit time on the date of service including examining the patient, evaluating/interpreting objective clinical data, coordinating care with the entire multidisciplinary care team and developing a comprehensive management plan as outlined above. All documented time was specific to the E/M visit and does  not include any procedures that may have been performed.      Marcel DELENA Saxon, MD      Subjective:      History of Present Illness:  Barbara Huber is a 84 y.o. female with PMH chronic hypoxic respiratory failure (on 3L home O2) 2/2 COPD and pulmonary hypertension (largely group 2/3), cardiac amyloidosis, and [name] who was admitted with AKI on [name]. She was recently seen in the ED for cough on 1/10 and was diagnosed with cornavirus NL 63 viral infection and pneumonia and was discharged on antibiotics (augmentin /doxycycline ). She was seen by her outpatient provider on 1/12 and an lower respiratory culture was obtained, which was positive for 4+ stenotrophomonas, which is a new organism for her. After discussion between her PCP and her primary pulmonologist, she was started on Levaquin  at a reduced dose to complete a total 8 day course (started approximately 1/16). She developed some neuropathic pain in her foot with this so it was switched to minocycline  for a total of 5 days (started approximately 1/21), of which she completed part of the course without significant improvement; due to overall feeling poorly, she stopped the antibiotics in case they were responsible but she didn't notice any improvement off of them. She presented for admission on 1/27 due to weakness and concerns regarding her renal function but has had persistent cough. She has been prescribed airway clearance with an Aerobika and hypertonic saline but did not like it previously so does not use this regularly. She does report some difficulty with swallowing liquids, feeling like she occasionally gets choked while drinking.    Review of Systems: A comprehensive review of systems was performed and was negative except as above in HPI  Past Medical History[1]  Past Surgical History[2]  Medications reviewed in Epic  Allergies as of 01/23/2025 - Reviewed 01/23/2025   Allergen Reaction Noted    Nitrofurantoin Anaphylaxis and Other (See Comments) 04/20/2016    Lipitor  [atorvastatin ] Muscle Pain 08/27/2020     Family History[3]  Social History     Tobacco Use    Smoking status: Former     Current packs/day: 0.00     Average packs/day: 0.3 packs/day     Types: Cigarettes     Quit date: 2019     Years since quitting: 7.0    Smokeless tobacco: Never    Tobacco comments:     Quit a few years ago   Substance Use Topics    Alcohol use: No        Objective:      Physical Exam:  Vitals:    01/25/25 0043 01/25/25 0415 01/25/25 0504 01/25/25 0902   BP:  113/70  145/69   Pulse: 79 69 65 65   Resp:  18 18    Temp:  36.4 ??C (97.5 ??F)     TempSrc:  Oral     SpO2:  100% 96%    Weight:       Height:         General: Alert, well-appearing, and in no distress.  Eyes: Anicteric sclera, conjunctiva clear.  ENT:  Mucous membranes moist and intact.  Lungs: Normal excursion. Good air movement bilaterally. No wheezes or crackles.  Cardiovascular: Regular rate and rhythm, S1, S2 normal, no murmur, click, rub or gallop appreciated.  Abdomen: Soft, non-tender, not distended  Musculoskeletal: No synovitis. No edema.  Skin: No rashes or lesions.  Neuro: No focal neurological deficits.    Malnutrition Assessment by RD:  Diagnostic Review:   All labs and images were personally reviewed.         [1]   Past Medical History:  Diagnosis Date    Acute exacerbation of CHF (congestive heart failure) (CMS-HCC) 08/19/2023    Acute exacerbation of CHF (congestive heart failure) (CMS-HCC) 08/19/2023    Acute kidney injury superimposed on chronic kidney disease 10/11/2020    Acute on chronic diastolic (congestive) heart failure (CMS-HCC) 08/23/2020    Acute on chronic diastolic congestive heart failure (CMS-HCC) 08/23/2020    AKI (acute kidney injury) 04/14/2015    Lab Results  Component  Value  Date     CREATININE  1.90 (H)  06/12/2021     Had a bump in her creatinine when she was taking her diuretics every day.  She is currently taking 40 mg daily of torsemide  and 50 mg of spironolactone .  Her volume status is fragile.  Previously when she stopped her diuretic she becomes short of breath.  Plan: We will check her BMP today.  We will likely have to go to 40    AKI (acute kidney injury) 04/14/2015    Lab Results      Component    Value    Date           CREATININE    1.90 (H)    06/12/2021     Had a bump in her creatinine when she was taking her diuretics every day.  She is currently taking 40 mg daily of torsemide  and 50 mg of spironolactone .  Her volume status is fragile.  Previously when she stopped her diuretic she becomes short of breath.  Plan: We will check her BMP today.  We will likel    Anemia 08/22/2019    Iron  deficiency anemia          Lab Results      Component    Value    Date           WBC    9.2    03/21/2021           RBC    3.67 (L)    03/21/2021           HGB    9.9 (L)    03/21/2021           HCT    30.7 (L)    03/21/2021           MCV    83.7    03/21/2021           MCH    26.9    03/21/2021           MCHC    32.1    03/21/2021           RDW    21.9 (H)    03/21/2021           PLT    318     Arthritis     Calculus of kidney     Calculus of ureter     CHF (congestive heart failure) (CMS-HCC)     Chronic atrial fibrillation    (CMS-HCC) 07/20/2019    COPD (chronic obstructive pulmonary disease) (CMS-HCC)     Diabetes (CMS-HCC)     Gangrenous cholecystitis 10/11/2020    Generalized edema  06/17/2021    GERD (gastroesophageal reflux disease)     Hydronephrosis     Hypertension     Hyponatremia 10/11/2020    Intermediate coronary syndrome (  CMS-HCC) 03/13/2014    Lower extremity edema 09/28/2020    Lumbar stenosis     Microscopic hematuria     Nausea alone     Nephrolithiasis 04/17/2016    Neuropathy     Nocturia     Nocturia 07/01/2017    Other chronic cystitis     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  See plans for anemia, AKI  We are also tapering her gabapentin .  Currently on 300 mg 3 times daily.  We will decrease it to twice daily, and then nightly.  She states she is not having any recurrence of her pain.    Pulmonary hypertension    (CMS-HCC)     Renal colic     Shortness of breath 04/28/2020    Sleep apnea     Unstable angina pectoris    (CMS-HCC) 03/13/2014   [2]   Past Surgical History:  Procedure Laterality Date    BACK SURGERY  1995    CARPAL TUNNEL RELEASE Left 2014    HYSTERECTOMY  1971    IR INSERT CHOLECYSTOSTOMY TUBE PERCUTANEOUS  10/02/2020    IR INSERT CHOLECYSTOSMY TUBE PERCUTANEOUS 10/02/2020 Ivonne Butler Seed, MD IMG VIR [facility]    LUMBAR DISC SURGERY      PR REMOVAL GALLBLADDER N/A 10/06/2020    Procedure: CHOLECYSTECTOMY;  Surgeon: Curtistine Mira Dunnings, MD;  Location: MAIN OR [facility];  Service: Trauma    PR RIGHT HEART CATH O2 SATURATION & CARDIAC OUTPUT N/A 09/30/2020    Procedure: Right Heart Catheterization;  Surgeon: Reyes Jerilynn Sage, MD;  Location: [facility] CATH;  Service: Cardiology    PR RIGHT HEART CATH O2 SATURATION & CARDIAC OUTPUT N/A 01/09/2021    Procedure: Right Heart Catheterization;  Surgeon: Norleen Deward Grumbling, MD;  Location: [facility] CATH;  Service: Cardiology   [3]   Family History  Problem Relation Age of Onset    Hypertension Mother     Cancer Father         COLON CANCER    No Known Problems Sister     No Known Problems Sister     No Known Problems Brother     No Known Problems Brother     No Known Problems Maternal Aunt     No Known Problems Maternal Uncle     No Known Problems Paternal Aunt     No Known Problems Paternal Uncle     No Known Problems Maternal Grandmother     No Known Problems Maternal Grandfather     No Known Problems Paternal Grandmother     No Known Problems Paternal Grandfather     No Known Problems Other     Anesthesia problems Neg Hx     Broken bones Neg Hx     Clotting disorder Neg Hx     Collagen disease Neg Hx     Diabetes Neg Hx     Dislocations Neg Hx     Fibromyalgia Neg Hx     Gout Neg Hx     Hemophilia Neg Hx     Osteoporosis Neg Hx     Rheumatologic disease Neg Hx     Scoliosis Neg Hx     Severe sprains Neg Hx     Sickle cell anemia Neg Hx     Spinal Compression Fracture Neg Hx     GU problems Neg Hx     Kidney cancer Neg Hx     Prostate cancer Neg Hx

## 2025-01-25 NOTE — Progress Notes (Signed)
 Hospital Medicine Daily Progress Note    Assessment/Plan:    Principal Problem:    AKI (acute kidney injury)  Active Problems:    COPD (chronic obstructive pulmonary disease) (CMS-HCC)    Sleep apnea in adult    Chronic cystitis    Chronic respiratory failure with hypoxia    (CMS-HCC)    Chronic kidney disease ([name]), stage IV (severe)    (CMS-HCC)    Pulmonary hypertension    (CMS-HCC)    Chronic heart failure with preserved ejection fraction ([name]) (CMS-HCC)    Cardiac amyloidosis (CMS-HCC)    Weakness                 Barbara Huber is a 84 y.o. female that presented to [facility] with AKI (acute kidney injury).    [name] stage IV  History of [name] stage IV and history of AKI secondary to cardiorenal syndrome; low concern for amyloid related kidney disease in the past. Recent CR over last 3 months mostly 2-2.6 but as low as 1.8 on 01/06/25 and as high as 2.81 on 12/21/24. Over the last year, average seems to be about 2-2.6. I do not believe her admit Cr of 2.64 was outside her normal range. Her history of cardiorenal syndrome likely accounts for the variation, along with rising Cr after diuretics held after initial improvement.   Per family, torsemide  previously 100 mg qam and 60 mg in afternoon upon discharge from [facility] on 12/23/24 (EDW 166/167 per discharge summary). Recently, afternoon dose decreased to 40 mg by daughter due to concern about wt loss.   Daughter reports poor po intake for last few weeks in setting of URI and weight down to 158-159, however that was on ED scale. Pro BNP down to 5070 from 7382 on 1/10 and 88042 on 12/29. Wt here 164 lbs.   UA with hyaline casts. UPCR is 0.3, UACR with undetectable albumin . Urine nitrogen and sodium checked in ED, unclear why. Pt without hyponatremia.   Cr 2.64 up from 1.8 so technically AKI but within recently variability. Suspect Cr only drops to 1s when overloaded.  Example, the 1.8 Cr on 1/10 was in the setting of volume overload and pulm edema. May have component of prerenal AKI in setting of poor po intake and respiratory infection. No significant edema on exam and CXR with prominent interstitial marking similar to prior study.   Repeat Cr 2.5, up to 2.55 after holding diuretics. Will resume diuretics today   - avoid nephrotoxins, renal dosing of medications  - Resume home torsemide , 100 mg qAM, 40 mg qPM.   - BMP in AM         Cardiac amyloidosis - [name] - AF  Appears euvolemic, possibly mildly volume down given weight below EDW and poor po intake recently. Got 500 ml IVF in ED.  - hold torsemide , spironolactone   - consider restarting at lower dose pending repeat labs and volume status (has not been on metolazone  for awhile)  - continue amiodarone , midodrine   - crestor  non formulary and allergic to formulary equivalent atorvastatin   - no on anticoagulation due to issues with anemia per notes  - continue tafamidis , not due for vutrisian until 01/26/25     Persistent cough concern for pulmonary infection  COPD - pulmonary hypertension - chronic hypoxic respiratory failure  Seen in the ED for cough on 01/06/2025 and diagnosed with pneumonia.  RPP positive for coronavirus NL 63 and discharged on Augmentin  and doxycycline  due to concerns for pneumonia.  She  had an outpatient lower respiratory culture obtained on 01/09/2025 which subsequently grew stenotrophomonas  and 3+ mixed gram-negative rods; history of smooth Pseudomonas several months ago but could not find evidence of stenotrophomonas in the past.  As an outpatient she was started on Levaquin  which she could not tolerate due to neuropathic pain and then transition to minocycline .  She took maybe 2 days of this and then stopped due to side effects.   She continues to have persistent cough.  Remains on her home oxygen  with no evidence of worsening hypoxia.   - Chest CT with extensive secretions in the left mainstem bronchus extending into left lower lobe with associated aspiration or developing pneumonia.   - No increased SOB or O2 needs  - Sputum culture prelim with 4+ gram neg rods. Given her overall lack of pulm sx, suspect this could represent colonization rather than true disease.   - continue minocycline  for now; per [facility] antibiogram stenotrophomonas should be susceptible to it (and other oral option is bactrim  which would like to avoid in AKI)  - Consult pulm re: treatment of stenotrophomonas   - continue home 3L O2 during day, trilogy/O2 avaps at night  - duonebs 4x PRNa; uses albuterol  once a day for airway clearance at home  - hold Spiriva  while on duonebs  - continue Advair  or formulary equivalent  - Pulmonology consulted, awaiting formal recs  -Start BID saline 3% nebs with 2 puffs albuterol  prior and aerobica for clearance.      Generalized weakness  Generalized weakness and unable to ambulate due to weakness, fatigue and knee pain. Suspect multifactorial due to poor po intake, weight loss, infection, AKI plus underlying cardiac disease, lung disease, [name].  Also had generalized leg weakness last admission with improved with supportive care  - continue flexeril , lidoderm  patch, gabapentin , oxycodone  prn, tylenol  for knee pain  - PT/OT rec 5x low, pt declining, will plan for 3xl ow      Prophylaxis  -heparin   -on fosfomycin  prophylaxis weekly     Diet  -Nutrition Therapy Heart Healthy     Code Status / HCDM  -Full Code, Discussed with patient at the time of admission   -  HCDM (With Legal Document To Support): Barbara Huber - Daughter - [phone]     Anticipated Medically Ready for Discharge: Anticipated in 2-4 Days     Significant Comorbid Conditions:  -Age related debility POA requiring additional resources: DME, PT, or OT  -Dehydration POA requiring further investigation, treatment, or monitoring             I personally spent 35 minutes face-to-face and non-face-to-face in the care of this patient, which includes all pre, intra, and post visit time on the date of service.  All documented time was specific to the E/M visit and does not include any procedures that may have been performed.    ___________________________________________________________________    Subjective:  Doing well today. Worked with OT earlier. Discussed recs for rehab. Discussed pulm consult re: treatment of infection, airway clearance     General ROS: negative     Labs/Studies:  Labs and Studies from the last 24hrs per EMR and Reviewed    Objective:  Temp:  [36.2 ??C (97.2 ??F)-36.5 ??C (97.7 ??F)] 36.5 ??C (97.7 ??F)  Pulse:  [65-79] 70  Resp:  [18-20] 19  BP: (113-145)/(61-70) 119/66  SpO2:  [96 %-100 %] 98 %    GEN: NAD, lying in bed  EYES: EOMI  ENT: MMM  CV:  RRR  PULM: Rhonci that clear with cough   ABD: soft, NT/ND, +BS  EXT: No edema

## 2025-01-26 LAB — BASIC METABOLIC PANEL
ANION GAP: 17 mmol/L — ABNORMAL HIGH (ref 5–14)
BLOOD UREA NITROGEN: 82 mg/dL — ABNORMAL HIGH (ref 9–23)
BUN / CREAT RATIO: 34
CALCIUM: 9.1 mg/dL (ref 8.7–10.4)
CHLORIDE: 96 mmol/L — ABNORMAL LOW (ref 98–107)
CO2: 24 mmol/L (ref 20.0–31.0)
CREATININE: 2.42 mg/dL — ABNORMAL HIGH (ref 0.55–1.02)
EGFR CKD-EPI (2021) FEMALE: 19 mL/min/{1.73_m2} — ABNORMAL LOW (ref >=60)
GLUCOSE RANDOM: 90 mg/dL (ref 70–179)
POTASSIUM: 4.2 mmol/L (ref 3.4–4.8)
SODIUM: 137 mmol/L (ref 135–145)

## 2025-01-26 LAB — CBC
HEMATOCRIT: 33.5 % — ABNORMAL LOW (ref 34.0–44.0)
HEMOGLOBIN: 11.5 g/dL (ref 11.3–14.9)
MEAN CORPUSCULAR HEMOGLOBIN CONC: 34.3 g/dL (ref 32.0–36.0)
MEAN CORPUSCULAR HEMOGLOBIN: 31.1 pg (ref 25.9–32.4)
MEAN CORPUSCULAR VOLUME: 90.8 fL (ref 77.6–95.7)
MEAN PLATELET VOLUME: 9.6 fL (ref 6.8–10.7)
PLATELET COUNT: 127 10*9/L — ABNORMAL LOW (ref 150–450)
RED BLOOD CELL COUNT: 3.69 10*12/L — ABNORMAL LOW (ref 3.95–5.13)
RED CELL DISTRIBUTION WIDTH: 14.3 % (ref 12.2–15.2)
WBC ADJUSTED: 9.1 10*9/L (ref 3.6–11.2)

## 2025-01-26 LAB — PHOSPHORUS: PHOSPHORUS: 4.4 mg/dL (ref 2.4–5.1)

## 2025-01-26 LAB — MAGNESIUM: MAGNESIUM: 2.4 mg/dL (ref 1.6–2.6)

## 2025-01-26 MED ORDER — FLUCONAZOLE 150 MG TABLET
ORAL_TABLET | Freq: Once | ORAL | 0 refills | 0.00000 days | Status: CP | PRN
Start: 2025-01-26 — End: ?

## 2025-01-26 MED ORDER — SODIUM CHLORIDE 3 % FOR NEBULIZATION
Freq: Two times a day (BID) | RESPIRATORY_TRACT | 12 refills | 94.00000 days | Status: CP | PRN
Start: 2025-01-26 — End: ?

## 2025-01-26 MED ORDER — SODIUM CHLORIDE 0.9 % FOR NEBULIZATION
Freq: Two times a day (BID) | RESPIRATORY_TRACT | 0 refills | 60.00000 days | Status: CP | PRN
Start: 2025-01-26 — End: 2025-03-27

## 2025-01-26 MED ORDER — MINOCYCLINE 100 MG CAPSULE
ORAL_CAPSULE | Freq: Two times a day (BID) | ORAL | 0 refills | 7.00000 days | Status: CP
Start: 2025-01-26 — End: 2025-02-02

## 2025-01-26 MED ADMIN — heparin (porcine) 5,000 unit/mL injection 5,000 Units: 5000 [IU] | SUBCUTANEOUS | @ 11:00:00 | Stop: 2025-01-26

## 2025-01-26 MED ADMIN — heparin (porcine) 5,000 unit/mL injection 5,000 Units: 5000 [IU] | SUBCUTANEOUS | @ 04:00:00

## 2025-01-26 MED ADMIN — cholecalciferol (vitamin D3 25 mcg (1,000 units)) tablet 25 mcg: 25 ug | ORAL | @ 15:00:00 | Stop: 2025-01-26

## 2025-01-26 MED ADMIN — minocycline (MINOCIN) capsule 200 mg: 200 mg | ORAL | @ 02:00:00 | Stop: 2025-01-31

## 2025-01-26 MED ADMIN — minocycline (MINOCIN) capsule 200 mg: 200 mg | ORAL | @ 15:00:00 | Stop: 2025-01-26

## 2025-01-26 MED ADMIN — guaiFENesin (ROBITUSSIN) oral syrup: 200 mg | ORAL | @ 02:00:00

## 2025-01-26 MED ADMIN — midodrine (PROAMATINE) tablet 5 mg: 5 mg | ORAL | @ 15:00:00 | Stop: 2025-01-26

## 2025-01-26 MED ADMIN — albuterol (PROVENTIL HFA;VENTOLIN HFA) 90 mcg/actuation inhaler 2 puff: 2 | RESPIRATORY_TRACT | @ 13:00:00 | Stop: 2025-01-26

## 2025-01-26 MED ADMIN — albuterol (PROVENTIL HFA;VENTOLIN HFA) 90 mcg/actuation inhaler 2 puff: 2 | RESPIRATORY_TRACT | @ 03:00:00

## 2025-01-26 MED ADMIN — torsemide (DEMADEX) tablet 100 mg: 100 mg | ORAL | @ 15:00:00 | Stop: 2025-01-26

## 2025-01-26 MED ADMIN — ferrous sulfate tablet 325 mg: 325 mg | ORAL | @ 15:00:00 | Stop: 2025-01-26

## 2025-01-26 MED ADMIN — gabapentin (NEURONTIN) capsule 100 mg: 100 mg | ORAL | @ 02:00:00

## 2025-01-26 MED ADMIN — gabapentin (NEURONTIN) capsule 100 mg: 100 mg | ORAL | @ 15:00:00 | Stop: 2025-01-26

## 2025-01-26 MED ADMIN — spironolactone (ALDACTONE) split tablet 12.5 mg: 12.5 mg | ORAL | @ 15:00:00 | Stop: 2025-01-26

## 2025-01-26 MED ADMIN — fluticasone furoate-vilanterol (BREO ELLIPTA) 100-25 mcg/dose inhaler 1 puff: 1 | RESPIRATORY_TRACT | @ 13:00:00 | Stop: 2025-01-26

## 2025-01-26 MED ADMIN — amiodarone (PACERONE) tablet 200 mg: 200 mg | ORAL | @ 15:00:00 | Stop: 2025-01-26

## 2025-01-26 MED ADMIN — sodium chloride 3 % NEBULIZER solution 4 mL: 4 mL | RESPIRATORY_TRACT | @ 03:00:00

## 2025-01-26 MED ADMIN — sodium chloride 3 % NEBULIZER solution 4 mL: 4 mL | RESPIRATORY_TRACT | @ 13:00:00 | Stop: 2025-01-26

## 2025-01-26 MED ADMIN — **tafamidis cap 61 mg Patient Own Med**: 61 mg | ORAL | @ 15:00:00 | Stop: 2025-01-26 | NDC 00069873030

## 2025-01-26 MED ADMIN — famotidine (PEPCID) tablet 20 mg: 20 mg | ORAL | @ 15:00:00 | Stop: 2025-01-26

## 2025-01-26 MED ADMIN — lidocaine (ASPERCREME) 4 % 1 patch: 1 | TRANSDERMAL | @ 15:00:00 | Stop: 2025-01-26

## 2025-01-26 MED ADMIN — vitamin A-3,000 mcg RAE (10,000 UNIT) capsule 3,000 mcg of RAE: 3000 ug | ORAL | @ 15:00:00 | Stop: 2025-01-26

## 2025-01-26 MED ADMIN — pantoprazole (Protonix) EC tablet 20 mg: 20 mg | ORAL | @ 15:00:00 | Stop: 2025-01-26

## 2025-01-26 MED ADMIN — fosfomycin (MONUROL) packet 3 g: 3 g | ORAL | @ 15:00:00 | Stop: 2025-01-26

## 2025-01-26 NOTE — Discharge Summary (Signed)
 ------------------------------------------------------------------------------- Attestation signed by Pabalan, Melissa Lynn, MD at 01/27/25 1419 I agree with the note and was available by pager. MP -------------------------------------------------------------------------------   Physician Discharge Summary HBR 4 BT1 HBR [address] [place] [address] Dept: [phone] Loc: [phone]   Identifying Information:  Megan Bolton 1941-04-30 [id number]  Primary Care Physician: [name], MD  Code Status: Full Code  Admit Date: 01/23/2025  Discharge Date: 01/26/2025   Discharge To: Home with Home Health and/or PT/OT  Discharge Service: HBR - HBR: Hospitalist [facility] APP   Discharge Attending Physician: No att. providers found  Discharge Diagnoses: Principal Problem:   Weakness (POA: Yes) Active Problems:   COPD (chronic obstructive pulmonary disease) (CMS-HCC) (POA: Yes)   Sleep apnea in adult (POA: Yes)   Chronic cystitis (POA: Yes)   Chronic respiratory failure with hypoxia    (CMS-HCC) (POA: Yes)   Chronic kidney disease ([name]), stage IV (severe)    (CMS-HCC) (POA: Yes)   Pulmonary hypertension    (CMS-HCC) (POA: Yes)   Chronic heart failure with preserved ejection fraction ([name]) (CMS-HCC) (POA: Yes)   Cardiac amyloidosis (CMS-HCC) (POA: Yes) Resolved Problems:   AKI (acute kidney injury) (POA: Yes)   Outpatient Provider Follow Up Issues:  PCP: Hospital follow up, weakness. Likely deconditioning + pneumonia. CT concerning for severe aspiration, but no overt aspiration at bedside. Pulm recommended 10 days minocycline for possible stenotrophomonas maltophilia infection vs colonization Possible AKI on admit, but suspect her Cr represents current baseline as noted below.   Pulm: Hospital follow up- CT concerning for severe aspiration, but no overt aspiration at bedside. Pulm recommended 10 days minocycline for possible stenotrophomonas  maltophilia infection vs colonization. Dc with neb saline and aerobika.   Hospital Course:  High-Level Summary  Megan Bolton is an 84 year old female with a history of COPD, chronic hypoxic respiratory failure, cardiac amyloidosis, chronic heart failure with preserved ejection fraction ([name]), and stage IV chronic kidney disease ([name]), who was admitted with generalized weakness and acute kidney injury (AKI) in the context of recent respiratory infection, poor oral intake, and weight loss.  [facility] Course  **1. Generalized Weakness and Functional Decline**  She presented with acute worsening of baseline weakness, particularly in her legs, resulting in inability to ambulate independently and increased need for assistance with activities of daily living (ADLs). This was associated with poor oral intake, subjective wt loss, weight loss (down to 164 lbs from a prior dry wt of 166 at dc 12/27), and recent viral URI, followed by bacterial infection. Physical and occupational therapy evaluations revealed significant deficits in strength, endurance, and balance, with high fall risk and need for moderate to maximal assistance for transfers and ADLs. She required supervision for all mobility and was recommended for ongoing low-intensity PT/OT post-discharge. She declined recommendation of SNF for PT/OT and opted to dc home.  Her functional status was below baseline, and she was primarily using a rollator for short distances and a wheelchair for community mobility prior to admission.  **2.Chronic Kidney Disease (Stage IV)**  She has a history of [name] stage IV with baseline creatinine typically 2-2.6 mg/dL, and presented with a creatinine of 2.64 mg/dL, which was within her recent range but technically met criteria for AKI with 1/10 Cr being 1.8 in the setting of dilution/volume overload. Euvolemic on exam. Diuretics were held initially and then resumed once renal function stabilized. Nephrotoxic  agents were avoided and medications were renally dosed as appropriate.  **3. Cardiac Amyloidosis, [name], and Cardiorenal Syndrome**  She has a history of cardiac amyloidosis and [name], with prior admissions for volume overload and cardiorenal syndrome. On admission, she appeared euvolemic, with weight slightly below estimated dry weight and no significant edema. She received a 500 mL IV fluid bolus in the ED, resulting in no change to renal function. Diuretics were held initially, resumed when Cr began to elevate. Cr improved after resuming diuresis, implying cardiorenal etiology. She is not on anticoagulation due to anemia. Tafamidis was continued during hospitalization.  **4. COPD, Chronic Hypoxic Respiratory Failure, Pulmonary Hypertension, and Recent Respiratory Infection**  She has chronic hypoxic respiratory failure (on 3L O2 at baseline), COPD, and pulmonary hypertension. She was recently treated for pneumonia and coronavirus NL63 infection with multiple antibiotics, including Augmentin , doxycycline , levofloxacin  (discontinued due to neuropathic pain), and minocycline. Outpatient sputum culture grew Stenotrophomonas maltophilia; minocycline was continued as the preferred oral agent given renal considerations. She took 2 days of prescribed course and stopped due to side effect of general malaise.  She had persistent cough but no increased oxygen  requirement or respiratory distress during admission. Airway clearance with albuterol  and hypertonic saline was initiated. Pulmonology was consulted and recommended a 10-day course of minocycline if tolerated, airway clearance, and SLP evaluation for dysphagia. She remained on home oxygen  and inhaled therapies, with no evidence of worsening hypoxia or increased oxygen  needs.  **5. Deconditioning and Rehabilitation Needs**  Her hospitalization was complicated by significant deconditioning, with impaired mobility, endurance, and ADL performance. She required  moderate to maximal assistance for transfers and ambulation, and total assistance for toileting and lower body dressing. She was recommended for ongoing PT and OT at 5x low, however they declined this and opted for [facility] with [facility]discharge  **6. Nutrition and Weight Loss**  Daughter concerned about wt loss, reporting 7 lb loss, however this was based on wt obtained in ED, which was inaccurate. Pt's dry wt at dc on 12/27 was 166 lbs, wt this admission 164 lbs. Weight of 170 lbs was in the setting of volume overload.   May have mild protein-calorie malnutrition in the context of acute illness. Oral nutritional supplements were recommended, and her intake improved during admission. She was encouraged to meet at least 75% of nutritional needs via meals and supplements. Wt Readings from Last 6 Encounters:  01/24/25 74.4 kg (164 lb 0.4 oz)  01/08/25 74.4 kg (164 lb)  01/06/25 77.1 kg (170 lb)  12/25/24 77.1 kg (170 lb)  12/23/24 77.2 kg (170 lb 1.6 oz)  12/13/24 75.3 kg (166 lb)     **7. Chronic Cystitis and Other Comorbidities** Questionable dx of DM  Her history includes chronic cystitis, diabetes, hypertension, atrial fibrillation, and prior anemia. These were not acutely exacerbated during this admission but remain relevant to her overall care and medication management. Pt not on medications for DM, glucose normal throughout stay. Most recent A1C 4.6%, has not   **8. Sleep Apnea**  She has a history of sleep apnea and uses AVAPS at night as part of her home respiratory regimen.  **9. Safety and Discharge Planning**  She was maintained on fall precautions throughout her stay. Discharge planning included arrangements for home health services, PT/OT, and caregiver support. She met Medicare homebound criteria and was provided with a list of post-acute care providers. Her daughter is her healthcare power of attorney and primary caregiver.  **10. Code Status**  She is full code, with healthcare  power of attorney documentation on file.  **11. Medication Allergies**  She has documented allergies to  nitrofurantoin  (anaphylaxis) and atorvastatin  (muscle pain).  **12. 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  **13. Hospital Course Complications**  No hospital-acquired complications or injuries were reported during this admission.  **14. Advance Care Planning**  Her goals of care and code status were reviewed and confirmed with her and her daughter during admission.  **15. R lateral knee pain.  No known injury. Began at least a week ago. Xray without acute injury, no effusion, mild tricompartmental arthritis. Home PT arranged. Follow up with PCP.  **16. Discharge Needs**  She will require ongoing PT/OT, home health, and caregiver support due to persistent functional deficits and high fall risk.   Procedures: None No admission procedures for hospital encounter. ______________________________________________________________________ Discharge Medications:   Your Medication List     PAUSE taking these medications    metOLazone 5 MG tablet Wait to take this until your doctor or other care provider tells you to start again. Commonly known as: ZAROXOLYN Take 1 tablet (5 mg total) by mouth daily as needed (up to 3x weekly when instructed by cardiology clinic).       CHANGE how you take these medications    fluconazole 150 MG tablet Commonly known as: DIFLUCAN Take 1 tablet (150 mg total) by mouth once as needed (use as needed for yeast) for up to 2 doses. Can take second dose if symptoms persist after 72hrs. What changed: reasons to take this   OXYGEN -AIR DELIVERY SYSTEMS MISC 4 L by Miscellaneous route continuous. What changed: how much to take   sodium chloride  3 % NEBULIZER solution Inhale 4 mL by nebulization two (2) times a day as  needed for other. Use 2 puffs of albuterol  followed by nebulization of saline. Use BID until your congestion improves. What changed:  when to take this additional instructions   sodium chloride  0.9 % nebulizer solution Inhale 3 mL by nebulization two (2) times a day as needed for wheezing (Use 0.9% saline if you have ongoing throat irritation with the 3% saline.). What changed: You were already taking a medication with the same name, and this prescription was added. Make sure you understand how and when to take each.       CONTINUE taking these medications    ACCU-CHEK GUIDE TEST STRIPS Strp Generic drug: blood sugar diagnostic Use to check blood sugar daily   ACCU-CHEK SOFTCLIX LANCETS lancets Generic drug: lancets CHECK BLOOD SUGAR DAILY   acetaminophen  500 MG tablet Commonly known as: TYLENOL  Take 2 tablets (1,000 mg total) by mouth Three (3) times a day as needed for pain.   ADVAIR HFA 115-21 mcg/actuation inhaler Generic drug: fluticasone  propion-salmeterol INHALE 2 PUFFS TWICE A DAY   AEROCHAMBER MV inhaler Generic drug: inhalational spacing device 1 spacer for inhaler   albuterol  2.5 mg /3 mL (0.083 %) nebulizer solution Inhale 3 mL by nebulization every six (6) hours as needed for wheezing.   albuterol  90 mcg/actuation inhaler Commonly known as: PROVENTIL  HFA;VENTOLIN  HFA Inhale 1 puff every six (6) hours as needed for wheezing.   amiodarone  200 MG tablet Commonly known as: PACERONE  Take 1 tablet (200 mg total) by mouth daily.   AMVUTTRA 25 mg/0.5 mL injection Generic drug: vutrisiran Inject 0.5 mL (25 mg total) under the skin Take as directed. Every 6 Weeks   cholecalciferol  (vitamin D3-50 mcg (2,000 unit)) 50 mcg (2,000 unit) Cap Take 1 capsule (50 mcg total) by mouth in  the morning.   cranberry 500 mg Cap Take 500 mg by mouth daily with evening meal.   cyclobenzaprine 10 MG tablet Commonly known as: FLEXERIL Take 1 tablet (10 mg total) by mouth two  (2) times a day as needed for muscle spasms.   ferrous sulfate  325 (65 FE) MG tablet Take 1 tablet (325 mg total) by mouth in the morning.   fosfomycin  3 gram Pack Commonly known as: MONUROL  Take 3 g by mouth once a week.   gabapentin  100 MG capsule Commonly known as: NEURONTIN  Take 1 capsule (100 mg total) by mouth Three (3) times a day.   HYDROcodone -chlorpheniramine polistirex 10-8 mg/5 mL ER suspension Commonly known as: TUSSIONEX PENNKINETIC Take 5 mL by mouth every twelve (12) hours as needed for cough.   LIDOCAINE PAIN RELIEF 4 % patch Generic drug: lidocaine Place 1 patch on the skin daily. Remove after 12 hours.   midodrine  5 MG tablet Commonly known as: PROAMATINE  Take 1 tablet (5 mg total) by mouth three (3) times a day.   minocycline 100 MG capsule Commonly known as: MINOCIN Take 2 capsules (200 mg total) by mouth two (2) times a day for 7 days.   NARCAN 4 mg/actuation nasal spray Generic drug: naloxone 1 spray into alternating nostrils once as needed (opioid overdose). PRN - Emergency use.   nystatin 100,000 unit/gram powder Commonly known as: MYCOSTATIN Apply to affected area 3 times daily   ondansetron  4 MG disintegrating tablet Commonly known as: ZOFRAN -ODT Dissolve 1 tablet (4 mg total) in the mouth every eight (8) hours as needed for nausea.   oxyCODONE  5 MG immediate release tablet Commonly known as: ROXICODONE  Take 1 tablet (5 mg total) by mouth every eight (8) hours as needed for pain.   pantoprazole  20 MG tablet Commonly known as: Protonix  TAKE 1 TABLET ONCE DAILY   rosuvastatin 5 MG tablet Commonly known as: CRESTOR TAKE 1 TABLET EVERY OTHER DAY   SPIRIVA  RESPIMAT 2.5 mcg/actuation inhalation mist Generic drug: tiotropium bromide  Inhale 2 puffs daily.   spironolactone 25 MG tablet Commonly known as: ALDACTONE Take 0.5 tablets (12.5 mg total) by mouth daily.   torsemide 20 MG tablet Commonly known as: DEMADEX Take 5 tablets (100 mg  total) by mouth every morning AND 2 tablets (40 mg total) daily with lunch. Take with food.   vitamin A-3,000 mcg RAE (10,000 UNIT) 3,000 mcg RAE (10,000 UNIT) capsule Take 1 capsule (3,000 mcg of RAE total) by mouth daily.   VYNDAMAX 61 mg Cap Generic drug: tafamidis Take 1 capsule (61 mg) by mouth daily.        Allergies: Nitrofurantoin  and Lipitor [atorvastatin ] ______________________________________________________________________ Pending Test Results (if blank, then none): Pending Labs     Order Current Status   Lower Respiratory Culture Preliminary result       Most Recent Labs: All lab results last 24 hours -  Recent Results (from the past 24 hours)  POCT Glucose   Collection Time: 01/25/25  4:52 PM  Result Value Ref Range   Glucose, POC 117 70 - 179 mg/dL  POCT Glucose   Collection Time: 01/25/25  8:04 PM  Result Value Ref Range   Glucose, POC 125 70 - 179 mg/dL  CBC   Collection Time: 01/26/25  6:37 AM  Result Value Ref Range   WBC 9.1 3.6 - 11.2 10*9/L   RBC 3.69 (L) 3.95 - 5.13 10*12/L   HGB 11.5 11.3 - 14.9 g/dL   HCT 66.4 (L) 65.9 -  44.0 %   MCV 90.8 77.6 - 95.7 fL   MCH 31.1 25.9 - 32.4 pg   MCHC 34.3 32.0 - 36.0 g/dL   RDW 85.6 87.7 - 84.7 %   MPV 9.6 6.8 - 10.7 fL   Platelet 127 (L) 150 - 450 10*9/L  Basic metabolic panel   Collection Time: 01/26/25  6:37 AM  Result Value Ref Range   Sodium 137 135 - 145 mmol/L   Potassium 4.2 3.4 - 4.8 mmol/L   Chloride 96 (L) 98 - 107 mmol/L   CO2 24.0 20.0 - 31.0 mmol/L   Anion Gap 17 (H) 5 - 14 mmol/L   BUN 82 (H) 9 - 23 mg/dL   Creatinine 7.57 (H) 9.44 - 1.02 mg/dL   BUN/Creatinine Ratio 34    eGFR [name]-EPI (2021) Female 19 (L) >=60 mL/min/1.23m2   Glucose 90 70 - 179 mg/dL   Calcium  9.1 8.7 - 10.4 mg/dL  Magnesium  Level   Collection Time: 01/26/25  6:37 AM  Result Value Ref Range   Magnesium  2.4 1.6 - 2.6 mg/dL  Phosphorus Level   Collection Time: 01/26/25  6:37 AM  Result Value Ref Range    Phosphorus 4.4 2.4 - 5.1 mg/dL  POCT Glucose   Collection Time: 01/26/25  7:57 AM  Result Value Ref Range   Glucose, POC 74 70 - 179 mg/dL    Relevant Studies/Radiology (if blank, then none): XR Knee 3 Views Right Result Date: 01/26/2025 EXAM: XR KNEE 3 VIEWS RIGHT DATE: 01/26/2025 8:39 AM ACCESSION: [id number] UN DICTATED: 01/26/2025 8:36 AM INTERPRETATION LOCATION: [facility]   CLINICAL INDICATION: 85 years old Female with lateral knee pain    COMPARISON: None.   TECHNIQUE: AP and lateral views of the right knee. No views of the left knee.   FINDINGS:   No acute fracture or dislocation.   No aggressive osseous lesion. Diffuse osseous osteopenia. Sequelae of an old, healed fracture involving the fibular neck.   No joint effusion.   Mild tricompartmental joint space narrowing with tiny osteophytes, most pronounced in the medial compartment, consistent with mild degenerative change.   Extensive vascular calcifications.     1.  No acute fracture or dislocation. 2.  No joint effusion. 3.  Mild tricompartmental osteoarthrosis. 4.  Extensive vascular calcifications.  CT Chest Wo Contrast Result Date: 01/24/2025 EXAM: CT CHEST WO CONTRAST ACCESSION: [id number] UN REPORT DATE: 01/24/2025 6:07 PM   CLINICAL INDICATION: eval atypical infection   TECHNIQUE: Contiguous noncontrast axial images were reconstructed through the chest following a single breath hold helical acquisition. Images were reformatted in the axial. coronal, and sagittal planes. MIP slabs were also constructed.   COMPARISON: XR CHEST 2 VIEWS 01/06/2025   FINDINGS:   LUNGS/AIRWAYS/PLEURA: Trachea is patent. There is extensive endobronchial secretions in the left mainstem bronchus extending into the left lower lobe airways with associated bronchial wall thickening. There is peripheral nodular consolidation. There is subsegmental atelectasis in the lingula. Upper lung predominant centrilobular and paraseptal emphysema  there is scattered nonspecific pulmonary micronodules. No pleural effusion or pneumothorax.   MEDIASTINUM/THORACIC INLET: Multiple borderline enlarged mediastinal lymph nodes. Patulous fluid-filled esophagus. No thyroid  abnormality.   HEART/VASCULATURE: Left atrial enlargement. Mitral annular calcification. Moderate coronary calcification. No pericardial effusion. Aorta is normal in caliber. Main pulmonary artery is normal in size.   DIAPHRAGM/UPPER ABDOMEN: Diaphragm intact and normally positioned. No acute findings in the minimally visualized upper abdomen. Aortic calcification.   CHEST WALL/BONES: No enlarged axillary lymph nodes. Degenerative changes are present. Decreased  osseous mineralization with multilevel compression deformities. No suspicious focal lytic or sclerotic lesions.   DEVICES: None.     Extensive secretions in the left mainstem bronchus extending into left lower lobe with associated aspiration or developing pneumonia.   Emphysema with scattered nonspecific pulmonary micronodules. Consider follow-up CT chest in 1 year.                         ______________________________________________________________________ Discharge Instructions:     Follow Up instructions and Outpatient Referrals    Ambulatory Referral to Home Health     Reason for referral: PT and OT   Is this a [facility] or [facility] Patient?: Yes   Do you want agency provider parameter notifications or patient specific  provider parameter notifications?: Agency   Do you want to initiate remote patient monitoring?: Yes   Physician to follow patient's care: PCP   Disciplines requested:  Physical Therapy Occupational Therapy     Physical Therapy requested: Evaluate and treat   Occupational Therapy Requested: Evaluate and treat   Discharge instructions       Other Instructions     Discharge instructions     You are already scheduled for a follow up appointment with Dr.  Myrna on 2/16. I have requested a pulmonology follow up for you, they should call you to schedule this.   You should take minocycline for the 7 days. If you are unable to tolerate this, it's okay to stop, but please call Dr. Myrna and Dr. Janie to notify them.   You should continue airway clearance with  2 puffs of albuterol  followed by saline nebulizer twice a day while you are still sick and producing mucus. You should also use your aerobika twice a day. I recommend you continue this even after recovery.   Your knee xray shows mild arthritis. Physical therapy and tylenol  are recommended. You should also follow up with Dr. Myrna about this.   Speech therapy has recommended outpatient speech therapy. We will place a referral for this.       Appointments which have been scheduled for you    Feb 09, 2025 1:30 PM (Arrive by 1:15 PM) RETURN HEART FAILURE with Damien Brodie Lee, AGNP [facility] CARDIOLOGY [place] [facility] REGION) [address] [facility] [address] [place] [address] [phone]     Feb 12, 2025 4:10 PM (Arrive by 3:55 PM) RETURN VIDEO VISIT MYCHART with Velia Corena Myrna, MD [facility] INTERNAL MEDICINE [place] [facility] REGION) [address] [facility] [address] [place] [address] [phone]  Please sign into My [facility] Chart at least 15 minutes before your appointment to complete the eCheck-In process. You must complete eCheck-In before you can start your video visit. We also recommend testing your audio and video connection to troubleshoot any issues before your visit begins. Click Join Video Visit to complete these checks. Once you have completed eCheck-In and tested your audio and video, click Join Call to connect to your visit.   For your video visit, you will need a computer with a working camera, speaker and microphone, a smartphone, or a tablet with internet access.  My [facility] Chart enables you to manage your  health, send non-urgent messages to your provider, view your test results, schedule and manage appointments, and request prescription refills securely and conveniently from your computer or mobile device.  You can go to [email or web address] to sign in  to your [facility] Chart account with your username and password. If you have forgotten your username or password, please choose the Forgot Username? and/or Forgot Password? links to gain access. You also can access your [facility] Chart account with the free MyChart mobile app for Android or iPhone.  If you need assistance accessing your [facility] Chart account or for assistance in reaching your provider's office to reschedule or cancel your appointment, please call [facility] [phone].        ______________________________________________________________________ Discharge Day Services: BP 137/68   Pulse 71   Temp 36.4 C (97.6 F) (Tympanic)   Resp 20   Ht 167.6 cm (5' 6)   Wt 74.4 kg (164 lb 0.4 oz)   LMP  (LMP Unknown)   SpO2 100%   BMI 26.47 kg/m  Pt seen on the day of discharge and determined appropriate for discharge.  Condition at Discharge: stable  Length of Discharge: I spent 60 mins in the discharge of this patient.

## 2025-01-26 NOTE — Consults (Signed)
 Speech Language Pathology Clinical Swallow Assessment  Evaluation (01/26/25 1040)    Patient Name:  Megan Bolton       Medical Record Number: [id number]   Date of Birth: Mar 01, 1941  Sex: Female            SLP Treatment Diagnosis:  (r/o oropharyngeal dysphagia)     Activity Tolerance: Patient tolerated treatment well    Assessment  Patient was seen for Clinical Swallowing Evaluation this date given concern for aspiration on Chest CT 01/24/25. Patient alert, sitting upright in bed, accompanied by family. Oral motor evaluation revealed slight right-sided labial and facial weakness, which patient and family reported they had not noticed previously, is not patient's baseline. RN and MD notified. Oral phase was [facility] for retrieval, containment, mastication/manipulation and clearance. Coughing was noted following sequential cup sip. SLP provided education regarding FEES to visualize swallow function and determine presence of aspiration. Patient declined FEES at this time, stating she would be interested in further testing when she is feeling better. Discussed risks of aspiration, patient and her daugher opted to defer evaluation at this time. Recommend continuation of regular diet, thin liquids and medications whole with thin liquids. SLP to follow up to assess diet tolerance 1-2x. Additionally, recommend consideration of MBSS in OP setting to assess for oropharyngeal dysphagia. RN aware.  Risk for Aspiration: Mild     Recommendations:  PO Diet, Frequent Oral Care      Follow-up Referral Recommendations : Follow-up fiberoptic endoscopic evaluation of swallowing    Diet Liquids Recommendations: Thin Liquids, Level 0    Diet Solids Recommendation: Regular Consistency Solids    Recommended Form of Medications: Whole, With liquid      Recommended Compensatory Techniques : Slow rate, Small sips/bites, Upright 90 degrees    Post Acute Discharge Recommendations  Post Acute SLP Discharge Recommendations: To be determined    Prognosis: Good  Positive Indicators: Caregiver support  Barriers to Discharge: Endurance deficits     Plan of Care  SLP Daily Frequency: 1x per day, 2-3 days per week  Planned Treatment Duration : 02/09/25    Treatment Goals:  Short Term Goal 1: Pt will consume least restrictive diet and thin liquids without signs/symptoms of aspiration.   Planned Interventions: Dysphagia Intervention, Patient education        Patient and Family Goal: To go home.    Subjective  Medical Updates Since Last Visit/Relevant PMH Affecting Clinical Decision Making: Per H&P, 84 y.o. female who is presenting to [facility] with AKI (acute kidney injury), in the setting of the following pertinent/contributing co-morbidities: COPD, chronic hypoxic respiratory failure, cardiac amyloidosis, chronic [name], [name].           Communication Preference: Verbal  Patient/Caregiver Reports: Per caregiver report, patient coughs after drinking regularly.  Pain: no s/s of pain     Allergies: Nitrofurantoin and Lipitor  [atorvastatin ]  Current Medications[1]  Past Medical History[2]  Family History[3]  Past Surgical History[4]  Social History     Tobacco Use    Smoking status: Former     Current packs/day: 0.00     Average packs/day: 0.3 packs/day     Types: Cigarettes     Quit date: 2019     Years since quitting: 7.0    Smokeless tobacco: Never    Tobacco comments:     Quit a few years ago   Substance Use Topics    Alcohol use: No         General:  Hearing Exceptions: None  Self-Feeding Capacity: Functional for self-feeding                   Medical Tests / Procedures Comments: CT Chest 01/24/25: Extensive secretions in the left mainstem bronchus extending into left lower lobe with associated aspiration or developing pneumonia. Emphysema with scattered nonspecific pulmonary micronodules. Consider follow-up CT chest in 1 year.  Equipment/Environment: Vascular access (PIV, TLC, Port-a-cath, PICC, Purewick/Condom Catheter       Precautions / Restrictions  Precautions: Falls precautions  Required Braces or Orthoses: Non-applicable    Objective  Temperature Spikes Noted: No  Respiratory Status : O2 via nasal cannula  History of Intubation: No          Behavior/Cognition: Alert, Cooperative, Pleasant mood  Positioning : Upright in bed    Oral / Motor Exam  Vocal Quality: Normal  Labial ROM: Reduced right   Labial Symmetry: Abnormal symmetry right  Lingual ROM: Within Functional Limits  Lingual Symmetry: Within Functional Limits  Velum: elevation grossly WNL   Mandible: Within Functional Limits  Coordination: Functional  Facial ROM: Reduced right   Facial Symmetry: Right droop (mild)  Vocal Intensity: Within Functional Limits   Apraxia: None present   Dysarthria: None present   Intelligibility: Intelligible   Breath Support: Adequate for speech   Dentition: Adequate    Consistencies assessed: Thin liquid, puree, regular solid    Patient at end of session: All needs in reach, Friends/Family present, In bed, Notified Nurse, Notified Provider         Speech Therapy Session Duration  SLP Individual [mins]: 15    I attest that I have reviewed the above information.  Signed: Therisa Aris, SLP    Filed 01/26/2025    A student, Estefana Lewis, was present and participated in patient care today.   I was physically present and immediately available to direct and supervise tasks that were related to patient management.  The direction and supervision was continuous throughout the time these tasks were performed.                  [1]   Current Facility-Administered Medications   Medication Dose Route Frequency Provider Last Rate Last Admin    **tafamidis  cap 61 mg Patient Own Med**  61 mg Oral Daily Kingston Tawni Gull, MD   61 mg at 01/26/25 1010    acetaminophen  (TYLENOL ) tablet 1,000 mg  1,000 mg Oral TID PRN Kingston Tawni Gull, MD   1,000 mg at 01/24/25 2113    sodium chloride  3 % NEBULIZER solution 4 mL  4 mL Nebulization BID (RT) Southwell, Keva Doreen, [place] 4 mL at 01/26/25 9256    And    albuterol  (PROVENTIL  HFA;VENTOLIN  HFA) 90 mcg/actuation inhaler 2 puff  2 puff Inhalation BID (RT) Southwell, Keva Doreen, [place]   2 puff at 01/26/25 9260    amiodarone  (PACERONE ) tablet 200 mg  200 mg Oral Daily Kingston Tawni Gull, MD   200 mg at 01/26/25 1008    cholecalciferol  (vitamin D3 25 mcg (1,000 units)) tablet 25 mcg  25 mcg Oral Daily Kingston Tawni Gull, MD   25 mcg at 01/26/25 1008    cyclobenzaprine  (FLEXERIL ) tablet 10 mg  10 mg Oral BID PRN Kingston Tawni Gull, MD        famotidine  (PEPCID ) tablet 20 mg  20 mg Oral Daily PRN Southwell, Keva Doreen, PA   20 mg at 01/26/25 1008    ferrous sulfate  tablet 325 mg  325 mg Oral  Mon,Wed,Fri Kingston Tawni Gull, MD   325 mg at 01/26/25 1008    fluticasone  furoate-vilanterol (BREO ELLIPTA ) 100-25 mcg/dose inhaler 1 puff  1 puff Inhalation Daily (RT) Kingston Tawni Gull, MD   1 puff at 01/26/25 0741    fosfomycin  (MONUROL ) packet 3 g  3 g Oral Weekly Kingston Tawni Gull, MD   3 g at 01/26/25 1014    gabapentin  (NEURONTIN ) capsule 100 mg  100 mg Oral BID Kingston Tawni Gull, MD   100 mg at 01/26/25 1008    guaiFENesin  (ROBITUSSIN) oral syrup  200 mg Oral Q4H PRN Akolbire, Dunstan Atiayarne, MD   200 mg at 01/25/25 2034    heparin  (porcine) 5,000 unit/mL injection 5,000 Units  5,000 Units Subcutaneous Q8H Kingston Tawni Gull, MD   5,000 Units at 01/26/25 0540    ipratropium-albuterol  (DUO-NEB) 0.5-2.5 mg/3 mL nebulizer solution 3 mL  3 mL Nebulization Q6H PRN Southwell, Keva Doreen, PA        lidocaine  (ASPERCREME) 4 % 1 patch  1 patch Transdermal Daily Kingston Tawni Gull, MD   1 patch at 01/26/25 1009    midodrine  (PROAMATINE ) tablet 5 mg  5 mg Oral TID Kingston Tawni Gull, MD   5 mg at 01/26/25 1009    minocycline  (MINOCIN ) capsule 200 mg  200 mg Oral BID Kingston Tawni Gull, MD   200 mg at 01/26/25 1014    ondansetron  (ZOFRAN ) injection 4 mg  4 mg Intravenous Q6H PRN Akolbire, Dunstan Atiayarne, MD   4 mg at 01/24/25 1231    oxyCODONE  (ROXICODONE ) immediate release tablet 5 mg  5 mg Oral Q8H PRN Kingston Tawni Gull, MD   5 mg at 01/25/25 1532    pantoprazole  (Protonix ) EC tablet 20 mg  20 mg Oral Daily Kahl, Christina Ross, MD   20 mg at 01/26/25 1009    spironolactone  (ALDACTONE ) split tablet 12.5 mg  12.5 mg Oral Daily Southwell, Keva Doreen, PA   12.5 mg at 01/26/25 1008    torsemide  (DEMADEX ) tablet 100 mg  100 mg Oral Q AM Southwell, Keva Doreen, PA   100 mg at 01/26/25 1009    And    torsemide  (DEMADEX ) tablet 40 mg  40 mg Oral Daily with lunch Southwell, Keva Doreen, PA   40 mg at 01/25/25 1348    [Provider Hold] umeclidinium (INCRUSE ELLIPTA ) 62.5 mcg/actuation inhaler 1 puff  1 puff Inhalation Daily (RT) Kingston Tawni Gull, MD        vitamin A -3,000 mcg RAE (10,000 UNIT) capsule 3,000 mcg of RAE  3,000 mcg of RAE Oral Daily Kingston Tawni Gull, MD   3,000 mcg of RAE at 01/26/25 1014   [2]   Past Medical History:  Diagnosis Date    Acute exacerbation of CHF (congestive heart failure) (CMS-HCC) 08/19/2023    Acute exacerbation of CHF (congestive heart failure) (CMS-HCC) 08/19/2023    Acute kidney injury superimposed on chronic kidney disease 10/11/2020    Acute on chronic diastolic (congestive) heart failure (CMS-HCC) 08/23/2020    Acute on chronic diastolic congestive heart failure (CMS-HCC) 08/23/2020    AKI (acute kidney injury) 04/14/2015    Lab Results  Component  Value  Date     CREATININE  1.90 (H)  06/12/2021     Had a bump in her creatinine when she was taking her diuretics every day.  She is currently taking 40 mg daily of torsemide  and 50 mg of spironolactone .  Her volume status is fragile.  Previously when she stopped  her diuretic she becomes short of breath.  Plan: We will check her BMP today.  We will likely have to go to 40    AKI (acute kidney injury) 04/14/2015    Lab Results      Component    Value    Date           CREATININE    1.90 (H)    06/12/2021     Had a bump in her creatinine when she was taking her diuretics every day.  She is currently taking 40 mg daily of torsemide  and 50 mg of spironolactone .  Her volume status is fragile.  Previously when she stopped her diuretic she becomes short of breath.  Plan: We will check her BMP today.  We will likel    Anemia 08/22/2019    Iron  deficiency anemia          Lab Results      Component    Value    Date           WBC    9.2    03/21/2021           RBC    3.67 (L)    03/21/2021           HGB    9.9 (L)    03/21/2021           HCT    30.7 (L)    03/21/2021           MCV    83.7    03/21/2021           MCH    26.9    03/21/2021           MCHC    32.1    03/21/2021           RDW    21.9 (H)    03/21/2021           PLT    318     Arthritis     Calculus of kidney     Calculus of ureter     CHF (congestive heart failure) (CMS-HCC)     Chronic atrial fibrillation    (CMS-HCC) 07/20/2019    COPD (chronic obstructive pulmonary disease) (CMS-HCC)     Diabetes (CMS-HCC)     Gangrenous cholecystitis 10/11/2020    Generalized edema  06/17/2021    GERD (gastroesophageal reflux disease)     Hydronephrosis     Hypertension     Hyponatremia 10/11/2020    Intermediate coronary syndrome (CMS-HCC) 03/13/2014    Lower extremity edema 09/28/2020    Lumbar stenosis     Microscopic hematuria     Nausea alone     Nephrolithiasis 04/17/2016    Neuropathy     Nocturia     Nocturia 07/01/2017    Other chronic cystitis     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  See plans for anemia, AKI  We are also tapering her gabapentin .  Currently on 300 mg 3 times daily.  We will decrease it to twice daily, and then nightly.  She states she is not having any recurrence of her pain.    Pulmonary hypertension    (CMS-HCC)     Renal colic     Shortness of breath 04/28/2020    Sleep apnea     Type 2 diabetes mellitus with stage 4 chronic kidney disease, with long-term current use of insulin  (  CMS-HCC) 04/14/2015    Lab Results      Component    Value    Date A1C    4.8    03/03/2024           A1C    5.8 (H)    08/30/2023           A1C    5.8 (H)    04/14/2023     Diabetes, off meds, blood sugars have been in range  Plan: check A1C in 3 months            Unstable angina pectoris    (CMS-HCC) 03/13/2014   [3]   Family History  Problem Relation Age of Onset    Hypertension Mother     Cancer Father         COLON CANCER    No Known Problems Sister     No Known Problems Sister     No Known Problems Brother     No Known Problems Brother     No Known Problems Maternal Aunt     No Known Problems Maternal Uncle     No Known Problems Paternal Aunt     No Known Problems Paternal Uncle     No Known Problems Maternal Grandmother     No Known Problems Maternal Grandfather     No Known Problems Paternal Grandmother     No Known Problems Paternal Grandfather     No Known Problems Other     Anesthesia problems Neg Hx     Broken bones Neg Hx     Clotting disorder Neg Hx     Collagen disease Neg Hx     Diabetes Neg Hx     Dislocations Neg Hx     Fibromyalgia Neg Hx     Gout Neg Hx     Hemophilia Neg Hx     Osteoporosis Neg Hx     Rheumatologic disease Neg Hx     Scoliosis Neg Hx     Severe sprains Neg Hx     Sickle cell anemia Neg Hx     Spinal Compression Fracture Neg Hx     GU problems Neg Hx     Kidney cancer Neg Hx     Prostate cancer Neg Hx    [4]   Past Surgical History:  Procedure Laterality Date    BACK SURGERY  1995    CARPAL TUNNEL RELEASE Left 2014    HYSTERECTOMY  1971    IR INSERT CHOLECYSTOSTOMY TUBE PERCUTANEOUS  10/02/2020    IR INSERT CHOLECYSTOSMY TUBE PERCUTANEOUS 10/02/2020 Ivonne Butler Seed, MD IMG VIR [facility]    LUMBAR DISC SURGERY      PR REMOVAL GALLBLADDER N/A 10/06/2020    Procedure: CHOLECYSTECTOMY;  Surgeon: Curtistine Mira Dunnings, MD;  Location: MAIN OR [facility];  Service: Trauma    PR RIGHT HEART CATH O2 SATURATION & CARDIAC OUTPUT N/A 09/30/2020    Procedure: Right Heart Catheterization;  Surgeon: Reyes Jerilynn Sage, MD;  Location: [facility] CATH; Service: Cardiology    PR RIGHT HEART CATH O2 SATURATION & CARDIAC OUTPUT N/A 01/09/2021    Procedure: Right Heart Catheterization;  Surgeon: Norleen Deward Grumbling, MD;  Location: [facility] CATH;  Service: Cardiology

## 2025-01-26 NOTE — Consults (Addendum)
 PULMONARY CONSULT  NOTE      Patient: Barbara Huber(09/08/1941)  Reason for consultation: Barbara Huber is a 84 y.o. female who is seen in consultation at the request of Barbara Raeann Haws, PA for comprehensive evaluation of cough.    Assessment and Recommendations:      Principal Problem:    Weakness  Active Problems:    COPD (chronic obstructive pulmonary disease) (CMS-HCC)    Sleep apnea in adult    Chronic cystitis    Chronic respiratory failure with hypoxia    (CMS-HCC)    Chronic kidney disease ([name]), stage IV (severe)    (CMS-HCC)    Pulmonary hypertension    (CMS-HCC)    Chronic heart failure with preserved ejection fraction ([name]) (CMS-HCC)    Cardiac amyloidosis (CMS-HCC)    Barbara Huber is a 84 y.o. female with PMH chronic hypoxic respiratory failure (on 3L home O2) 2/2 COPD and pulmonary hypertension (largely group 2/3), cardiac amyloidosis, and [name] who was admitted with AKI on [name]. Pulmonology was consulted for persistent cough in the setting of lower respiratory culture results with stenotrophomonas.     I suspect that the etiology of her cough is likely multifactorial 2/2 post-viral cough from recent coronavirus NL 63 infection with potential contributions from aspiration, given esophageal and bronchial findings on CT chest as well as an additional component of stenotrophomonas PNA versus exacerbation of likely underlying bronchiectasis.    For the stenotrophomonas, unfortunately minocycline  remains her best oral treatment option given the risk of renal dysfunction with bactrim  and the side effects and poor susceptibility to levaquin . That being said, it's not clear just how much the organism is contributing to her overall symptoms so if she cannot tolerate it, it would be reasonable to stop and rely more heavily on airway clearance.     Recommendations:  - Suspect that 4+ GNRs on lower respiratory gram stain will likely yield stenotrophomonas, recommend treatment for presumed bronchiectasis exacerbation with 10 days of minocycline  as long as patient can tolerate this  - Please provide patient with instructions as discharge that if she cannot tolerate the minocycline  due to side effects, that she can stop it but should let Barbara Huber and Barbara Huber know via Mychart  - Airway clearance with pre-treatment albuterol  (2 puffs) followed by 3% hypertonic saline in-line with Aerobika BID  - Please provide patient with prescription for 0.9% nebulized saline to use in place of 3% HTS if the irritation she experienced is persistently associated with this   - Appreciate SLP input, plan for continued evaluation as an outpatient with potential FEES and MBSS  - Continue ICS/LABA + LAMA  - We will reach out to our clinic scheduling team to arrange follow up with Barbara Huber    We appreciate the opportunity to assist in the care of this patient.  We will sign off at this time. Please page [phone] with any questions.    Barbara DELENA Saxon, MD    Attestation      I directly provided consultative services as documented in this note and I personally spent 35 minutes face-to-face and non face-to-face in the care of this patient, which includes all pre, intra and post visit time on the date of service including examining the patient, evaluating/interpreting objective clinical data, coordinating care with the entire multidisciplinary care team and developing a comprehensive management plan as outlined above. All documented time was specific to the E/M visit and does not include any procedures that may have been  performed.      Barbara DELENA Saxon, MD      Subjective:      History of Present Illness:  Barbara Huber is a 84 y.o. female with PMH chronic hypoxic respiratory failure (on 3L home O2) 2/2 COPD and pulmonary hypertension (largely group 2/3), cardiac amyloidosis, and [name] who was admitted with AKI on [name]. She was recently seen in the ED for cough on 1/10 and was diagnosed with cornavirus NL 63 viral infection and pneumonia and was discharged on antibiotics (augmentin /doxycycline ). She was seen by her outpatient provider on 1/12 and an lower respiratory culture was obtained, which was positive for 4+ stenotrophomonas, which is a new organism for her. After discussion between her PCP and her primary pulmonologist, she was started on Levaquin  at a reduced dose to complete a total 8 day course (started approximately 1/16). She developed some neuropathic pain in her foot with this so it was switched to minocycline  for a total of 5 days (started approximately 1/21), of which she completed part of the course without significant improvement; due to overall feeling poorly, she stopped the antibiotics in case they were responsible but she didn't notice any improvement off of them. She presented for admission on 1/27 due to weakness and concerns regarding her renal function but has had persistent cough. She has been prescribed airway clearance with an Aerobika and hypertonic saline but did not like it previously so does not use this regularly. She does report some difficulty with swallowing liquids, feeling like she occasionally gets choked while drinking.    24-hour Interval Events: Patient with no complaints this morning. She tolerated the trial of hypertonic saline with the Aerobika yesterday without difficulty though her Huber reports that she was complaining of a sensation of heartburn afterwards and wasn't sure if it was related. Her cough has been quite productive with the addition of the airway clearance.     Review of Systems: A comprehensive review of systems was performed and was negative except as above in HPI  Past Medical History[1]  Past Surgical History[2]  Medications reviewed in Epic  Allergies as of 01/23/2025 - Reviewed 01/23/2025   Allergen Reaction Noted    Nitrofurantoin Anaphylaxis and Other (See Comments) 04/20/2016    Lipitor  [atorvastatin ] Muscle Pain 08/27/2020     Family History[3]  Social History     Tobacco Use Smoking status: Former     Current packs/day: 0.00     Average packs/day: 0.3 packs/day     Types: Cigarettes     Quit date: 2019     Years since quitting: 7.0    Smokeless tobacco: Never    Tobacco comments:     Quit a few years ago   Substance Use Topics    Alcohol use: No        Objective:      Physical Exam:  Vitals:    01/26/25 0015 01/26/25 0322 01/26/25 0702 01/26/25 0743   BP:  131/68     Pulse: 70 76 67 64   Resp:    16   Temp:       TempSrc:       SpO2:  99%  99%   Weight:       Height:         General: Alert, well-appearing, and in no distress.  Eyes: Anicteric sclera, conjunctiva clear.  ENT:  Mucous membranes moist and intact.  Lungs: Normal excursion. Good air movement bilaterally. Focal crackles in the left lower lobe.  Cardiovascular: Regular rate and rhythm, S1, S2 normal, no murmur, click, rub or gallop appreciated.  Abdomen: Soft, non-tender, not distended  Musculoskeletal: No synovitis. No edema.  Skin: No rashes or lesions.  Neuro: No focal neurological deficits.    Malnutrition Assessment by RD:  Non-severe (Moderate) Protein-Calorie Malnutrition in the context of acute illness or injury (01/25/25 1706)       Diagnostic Review:   All labs and images were personally reviewed.           [1]   Past Medical History:  Diagnosis Date    Acute exacerbation of CHF (congestive heart failure) (CMS-HCC) 08/19/2023    Acute exacerbation of CHF (congestive heart failure) (CMS-HCC) 08/19/2023    Acute kidney injury superimposed on chronic kidney disease 10/11/2020    Acute on chronic diastolic (congestive) heart failure (CMS-HCC) 08/23/2020    Acute on chronic diastolic congestive heart failure (CMS-HCC) 08/23/2020    AKI (acute kidney injury) 04/14/2015    Lab Results  Component  Value  Date     CREATININE  1.90 (H)  06/12/2021     Had a bump in her creatinine when she was taking her diuretics every day.  She is currently taking 40 mg daily of torsemide  and 50 mg of spironolactone .  Her volume status is fragile.  Previously when she stopped her diuretic she becomes short of breath.  Plan: We will check her BMP today.  We will likely have to go to 40    AKI (acute kidney injury) 04/14/2015    Lab Results      Component    Value    Date           CREATININE    1.90 (H)    06/12/2021     Had a bump in her creatinine when she was taking her diuretics every day.  She is currently taking 40 mg daily of torsemide  and 50 mg of spironolactone .  Her volume status is fragile.  Previously when she stopped her diuretic she becomes short of breath.  Plan: We will check her BMP today.  We will likel    Anemia 08/22/2019    Iron  deficiency anemia          Lab Results      Component    Value    Date           WBC    9.2    03/21/2021           RBC    3.67 (L)    03/21/2021           HGB    9.9 (L)    03/21/2021           HCT    30.7 (L)    03/21/2021           MCV    83.7    03/21/2021           MCH    26.9    03/21/2021           MCHC    32.1    03/21/2021           RDW    21.9 (H)    03/21/2021           PLT    318     Arthritis     Calculus of kidney     Calculus of ureter     CHF (congestive heart failure) (CMS-HCC)  Chronic atrial fibrillation    (CMS-HCC) 07/20/2019    COPD (chronic obstructive pulmonary disease) (CMS-HCC)     Diabetes (CMS-HCC)     Gangrenous cholecystitis 10/11/2020    Generalized edema  06/17/2021    GERD (gastroesophageal reflux disease)     Hydronephrosis     Hypertension     Hyponatremia 10/11/2020    Intermediate coronary syndrome (CMS-HCC) 03/13/2014    Lower extremity edema 09/28/2020    Lumbar stenosis     Microscopic hematuria     Nausea alone     Nephrolithiasis 04/17/2016    Neuropathy     Nocturia     Nocturia 07/01/2017    Other chronic cystitis     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  See plans for anemia, AKI  We are also tapering her gabapentin .  Currently on 300 mg 3 times daily.  We will decrease it to twice daily, and then nightly.  She states she is not having any recurrence of her pain.    Pulmonary hypertension    (CMS-HCC)     Renal colic     Shortness of breath 04/28/2020    Sleep apnea     Unstable angina pectoris    (CMS-HCC) 03/13/2014   [2]   Past Surgical History:  Procedure Laterality Date    BACK SURGERY  1995    CARPAL TUNNEL RELEASE Left 2014    HYSTERECTOMY  1971    IR INSERT CHOLECYSTOSTOMY TUBE PERCUTANEOUS  10/02/2020    IR INSERT CHOLECYSTOSMY TUBE PERCUTANEOUS 10/02/2020 Ivonne Butler Seed, MD IMG [facility]IR [facility]    LUMBAR DISC SURGERY      PR REMOVAL GALLBLADDER N/A 10/06/2020    Procedure: CHOLECYSTECTOMY;  Surgeon: Curtistine Mira Dunnings, MD;  Location: MAIN OR [facility];  Service: Trauma    PR RIGHT HEART CATH O2 SATURATION & CARDIAC OUTPUT N/A 09/30/2020    Procedure: Right Heart Catheterization;  Surgeon: Reyes Jerilynn Sage, MD;  Location: [facility] CATH;  Service: Cardiology    PR RIGHT HEART CATH O2 SATURATION & CARDIAC OUTPUT N/A 01/09/2021    Procedure: Right Heart Catheterization;  Surgeon: Norleen Deward Grumbling, MD;  Location: [facility] CATH;  Service: Cardiology   [3]   Family History  Problem Relation Age of Onset    Hypertension Mother     Cancer Father         COLON CANCER    No Known Problems Sister     No Known Problems Sister     No Known Problems Brother     No Known Problems Brother     No Known Problems Maternal Aunt     No Known Problems Maternal Uncle     No Known Problems Paternal Aunt     No Known Problems Paternal Uncle     No Known Problems Maternal Grandmother     No Known Problems Maternal Grandfather     No Known Problems Paternal Grandmother     No Known Problems Paternal Grandfather     No Known Problems Other     Anesthesia problems Neg Hx     Broken bones Neg Hx     Clotting disorder Neg Hx     Collagen disease Neg Hx     Diabetes Neg Hx     Dislocations Neg Hx     Fibromyalgia Neg Hx     Gout Neg Hx     Hemophilia Neg Hx     Osteoporosis Neg  Hx     Rheumatologic disease Neg Hx     Scoliosis Neg Hx     Severe sprains Neg Hx     Sickle cell anemia Neg Hx     Spinal Compression Fracture Neg Hx     GU problems Neg Hx     Kidney cancer Neg Hx     Prostate cancer Neg Hx

## 2025-01-26 NOTE — Consults (Signed)
 Speech Language Pathology Clinical Swallow Assessment  Evaluation (01/26/25 1040)    Patient Name:  Barbara Huber       Medical Record Number: [id number]   Date of Birth: Mar 01, 1941  Sex: Female            SLP Treatment Diagnosis:  (r/o oropharyngeal dysphagia)     Activity Tolerance: Patient tolerated treatment well    Assessment  Patient was seen for Clinical Swallowing Evaluation this date given concern for aspiration on Chest CT 01/24/25. Patient alert, sitting upright in bed, accompanied by family. Oral motor evaluation revealed slight right-sided labial and facial weakness, which patient and family reported they had not noticed previously, is not patient's baseline. RN and MD notified. Oral phase was [facility] for retrieval, containment, mastication/manipulation and clearance. Coughing was noted following sequential cup sip. SLP provided education regarding FEES to visualize swallow function and determine presence of aspiration. Patient declined FEES at this time, stating she would be interested in further testing when she is feeling better. Discussed risks of aspiration, patient and her daugher opted to defer evaluation at this time. Recommend continuation of regular diet, thin liquids and medications whole with thin liquids. SLP to follow up to assess diet tolerance 1-2x. Additionally, recommend consideration of MBSS in OP setting to assess for oropharyngeal dysphagia. RN aware.  Risk for Aspiration: Mild     Recommendations:  PO Diet, Frequent Oral Care      Follow-up Referral Recommendations : Follow-up fiberoptic endoscopic evaluation of swallowing    Diet Liquids Recommendations: Thin Liquids, Level 0    Diet Solids Recommendation: Regular Consistency Solids    Recommended Form of Medications: Whole, With liquid      Recommended Compensatory Techniques : Slow rate, Small sips/bites, Upright 90 degrees    Post Acute Discharge Recommendations  Post Acute SLP Discharge Recommendations: To be determined    Prognosis: Good  Positive Indicators: Caregiver support  Barriers to Discharge: Endurance deficits     Plan of Care  SLP Daily Frequency: 1x per day, 2-3 days per week  Planned Treatment Duration : 02/09/25    Treatment Goals:  Short Term Goal 1: Pt will consume least restrictive diet and thin liquids without signs/symptoms of aspiration.   Planned Interventions: Dysphagia Intervention, Patient education        Patient and Family Goal: To go home.    Subjective  Medical Updates Since Last Visit/Relevant PMH Affecting Clinical Decision Making: Per H&P, 84 y.o. female who is presenting to [facility] with AKI (acute kidney injury), in the setting of the following pertinent/contributing co-morbidities: COPD, chronic hypoxic respiratory failure, cardiac amyloidosis, chronic [name], [name].           Communication Preference: Verbal  Patient/Caregiver Reports: Per caregiver report, patient coughs after drinking regularly.  Pain: no s/s of pain     Allergies: Nitrofurantoin and Lipitor  [atorvastatin ]  Current Medications[1]  Past Medical History[2]  Family History[3]  Past Surgical History[4]  Social History     Tobacco Use    Smoking status: Former     Current packs/day: 0.00     Average packs/day: 0.3 packs/day     Types: Cigarettes     Quit date: 2019     Years since quitting: 7.0    Smokeless tobacco: Never    Tobacco comments:     Quit a few years ago   Substance Use Topics    Alcohol use: No         General:  Hearing Exceptions: None  Self-Feeding Capacity: Functional for self-feeding                   Medical Tests / Procedures Comments: CT Chest 01/24/25: Extensive secretions in the left mainstem bronchus extending into left lower lobe with associated aspiration or developing pneumonia. Emphysema with scattered nonspecific pulmonary micronodules. Consider follow-up CT chest in 1 year.  Equipment/Environment: Vascular access (PIV, TLC, Port-a-cath, PICC, Purewick/Condom Catheter       Precautions / Restrictions  Precautions: Falls precautions  Required Braces or Orthoses: Non-applicable    Objective  Temperature Spikes Noted: No  Respiratory Status : O2 via nasal cannula  History of Intubation: No          Behavior/Cognition: Alert, Cooperative, Pleasant mood  Positioning : Upright in bed    Oral / Motor Exam  Vocal Quality: Normal  Labial ROM: Reduced right   Labial Symmetry: Abnormal symmetry right  Lingual ROM: Within Functional Limits  Lingual Symmetry: Within Functional Limits  Velum: elevation grossly WNL   Mandible: Within Functional Limits  Coordination: Functional  Facial ROM: Reduced right   Facial Symmetry: Right droop (mild)  Vocal Intensity: Within Functional Limits   Apraxia: None present   Dysarthria: None present   Intelligibility: Intelligible   Breath Support: Adequate for speech   Dentition: Adequate    Consistencies assessed: Thin liquid, puree, regular solid    Patient at end of session: All needs in reach, Friends/Family present, In bed, Notified Nurse, Notified Provider         Speech Therapy Session Duration  SLP Individual [mins]: 15    I attest that I have reviewed the above information.  Signed: Therisa Aris, SLP    Filed 01/26/2025    A student, Estefana Lewis, was present and participated in patient care today.   I was physically present and immediately available to direct and supervise tasks that were related to patient management.  The direction and supervision was continuous throughout the time these tasks were performed.                  [1]   Current Facility-Administered Medications   Medication Dose Route Frequency Provider Last Rate Last Admin    **tafamidis  cap 61 mg Patient Own Med**  61 mg Oral Daily Kingston Tawni Gull, MD   61 mg at 01/26/25 1010    acetaminophen  (TYLENOL ) tablet 1,000 mg  1,000 mg Oral TID PRN Kingston Tawni Gull, MD   1,000 mg at 01/24/25 2113    sodium chloride  3 % NEBULIZER solution 4 mL  4 mL Nebulization BID (RT) Southwell, Keva Doreen, [place] 4 mL at 01/26/25 9256    And    albuterol  (PROVENTIL  HFA;VENTOLIN  HFA) 90 mcg/actuation inhaler 2 puff  2 puff Inhalation BID (RT) Southwell, Keva Doreen, [place]   2 puff at 01/26/25 9260    amiodarone  (PACERONE ) tablet 200 mg  200 mg Oral Daily Kingston Tawni Gull, MD   200 mg at 01/26/25 1008    cholecalciferol  (vitamin D3 25 mcg (1,000 units)) tablet 25 mcg  25 mcg Oral Daily Kingston Tawni Gull, MD   25 mcg at 01/26/25 1008    cyclobenzaprine  (FLEXERIL ) tablet 10 mg  10 mg Oral BID PRN Kingston Tawni Gull, MD        famotidine  (PEPCID ) tablet 20 mg  20 mg Oral Daily PRN Southwell, Keva Doreen, PA   20 mg at 01/26/25 1008    ferrous sulfate  tablet 325 mg  325 mg Oral  Mon,Wed,Fri Kingston Tawni Gull, MD   325 mg at 01/26/25 1008    fluticasone  furoate-vilanterol (BREO ELLIPTA ) 100-25 mcg/dose inhaler 1 puff  1 puff Inhalation Daily (RT) Kingston Tawni Gull, MD   1 puff at 01/26/25 0741    fosfomycin  (MONUROL ) packet 3 g  3 g Oral Weekly Kingston Tawni Gull, MD   3 g at 01/26/25 1014    gabapentin  (NEURONTIN ) capsule 100 mg  100 mg Oral BID Kingston Tawni Gull, MD   100 mg at 01/26/25 1008    guaiFENesin  (ROBITUSSIN) oral syrup  200 mg Oral Q4H PRN Akolbire, Dunstan Atiayarne, MD   200 mg at 01/25/25 2034    heparin  (porcine) 5,000 unit/mL injection 5,000 Units  5,000 Units Subcutaneous Q8H Kingston Tawni Gull, MD   5,000 Units at 01/26/25 0540    ipratropium-albuterol  (DUO-NEB) 0.5-2.5 mg/3 mL nebulizer solution 3 mL  3 mL Nebulization Q6H PRN Southwell, Keva Doreen, PA        lidocaine  (ASPERCREME) 4 % 1 patch  1 patch Transdermal Daily Kingston Tawni Gull, MD   1 patch at 01/26/25 1009    midodrine  (PROAMATINE ) tablet 5 mg  5 mg Oral TID Kingston Tawni Gull, MD   5 mg at 01/26/25 1009    minocycline  (MINOCIN ) capsule 200 mg  200 mg Oral BID Kingston Tawni Gull, MD   200 mg at 01/26/25 1014    ondansetron  (ZOFRAN ) injection 4 mg  4 mg Intravenous Q6H PRN Akolbire, Dunstan Atiayarne, MD   4 mg at 01/24/25 1231    oxyCODONE  (ROXICODONE ) immediate release tablet 5 mg  5 mg Oral Q8H PRN Kingston Tawni Gull, MD   5 mg at 01/25/25 1532    pantoprazole  (Protonix ) EC tablet 20 mg  20 mg Oral Daily Kahl, Christina Ross, MD   20 mg at 01/26/25 1009    spironolactone  (ALDACTONE ) split tablet 12.5 mg  12.5 mg Oral Daily Southwell, Keva Doreen, PA   12.5 mg at 01/26/25 1008    torsemide  (DEMADEX ) tablet 100 mg  100 mg Oral Q AM Southwell, Keva Doreen, PA   100 mg at 01/26/25 1009    And    torsemide  (DEMADEX ) tablet 40 mg  40 mg Oral Daily with lunch Southwell, Keva Doreen, PA   40 mg at 01/25/25 1348    [Provider Hold] umeclidinium (INCRUSE ELLIPTA ) 62.5 mcg/actuation inhaler 1 puff  1 puff Inhalation Daily (RT) Kingston Tawni Gull, MD        vitamin A -3,000 mcg RAE (10,000 UNIT) capsule 3,000 mcg of RAE  3,000 mcg of RAE Oral Daily Kingston Tawni Gull, MD   3,000 mcg of RAE at 01/26/25 1014   [2]   Past Medical History:  Diagnosis Date    Acute exacerbation of CHF (congestive heart failure) (CMS-HCC) 08/19/2023    Acute exacerbation of CHF (congestive heart failure) (CMS-HCC) 08/19/2023    Acute kidney injury superimposed on chronic kidney disease 10/11/2020    Acute on chronic diastolic (congestive) heart failure (CMS-HCC) 08/23/2020    Acute on chronic diastolic congestive heart failure (CMS-HCC) 08/23/2020    AKI (acute kidney injury) 04/14/2015    Lab Results  Component  Value  Date     CREATININE  1.90 (H)  06/12/2021     Had a bump in her creatinine when she was taking her diuretics every day.  She is currently taking 40 mg daily of torsemide  and 50 mg of spironolactone .  Her volume status is fragile.  Previously when she stopped  her diuretic she becomes short of breath.  Plan: We will check her BMP today.  We will likely have to go to 40    AKI (acute kidney injury) 04/14/2015    Lab Results      Component    Value    Date           CREATININE    1.90 (H)    06/12/2021     Had a bump in her creatinine when she was taking her diuretics every day.  She is currently taking 40 mg daily of torsemide  and 50 mg of spironolactone .  Her volume status is fragile.  Previously when she stopped her diuretic she becomes short of breath.  Plan: We will check her BMP today.  We will likel    Anemia 08/22/2019    Iron  deficiency anemia          Lab Results      Component    Value    Date           WBC    9.2    03/21/2021           RBC    3.67 (L)    03/21/2021           HGB    9.9 (L)    03/21/2021           HCT    30.7 (L)    03/21/2021           MCV    83.7    03/21/2021           MCH    26.9    03/21/2021           MCHC    32.1    03/21/2021           RDW    21.9 (H)    03/21/2021           PLT    318     Arthritis     Calculus of kidney     Calculus of ureter     CHF (congestive heart failure) (CMS-HCC)     Chronic atrial fibrillation    (CMS-HCC) 07/20/2019    COPD (chronic obstructive pulmonary disease) (CMS-HCC)     Diabetes (CMS-HCC)     Gangrenous cholecystitis 10/11/2020    Generalized edema  06/17/2021    GERD (gastroesophageal reflux disease)     Hydronephrosis     Hypertension     Hyponatremia 10/11/2020    Intermediate coronary syndrome (CMS-HCC) 03/13/2014    Lower extremity edema 09/28/2020    Lumbar stenosis     Microscopic hematuria     Nausea alone     Nephrolithiasis 04/17/2016    Neuropathy     Nocturia     Nocturia 07/01/2017    Other chronic cystitis     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  See plans for anemia, AKI  We are also tapering her gabapentin .  Currently on 300 mg 3 times daily.  We will decrease it to twice daily, and then nightly.  She states she is not having any recurrence of her pain.    Pulmonary hypertension    (CMS-HCC)     Renal colic     Shortness of breath 04/28/2020    Sleep apnea     Type 2 diabetes mellitus with stage 4 chronic kidney disease, with long-term current use of insulin  (  CMS-HCC) 04/14/2015    Lab Results      Component    Value    Date A1C    4.8    03/03/2024           A1C    5.8 (H)    08/30/2023           A1C    5.8 (H)    04/14/2023     Diabetes, off meds, blood sugars have been in range  Plan: check A1C in 3 months            Unstable angina pectoris    (CMS-HCC) 03/13/2014   [3]   Family History  Problem Relation Age of Onset    Hypertension Mother     Cancer Father         COLON CANCER    No Known Problems Sister     No Known Problems Sister     No Known Problems Brother     No Known Problems Brother     No Known Problems Maternal Aunt     No Known Problems Maternal Uncle     No Known Problems Paternal Aunt     No Known Problems Paternal Uncle     No Known Problems Maternal Grandmother     No Known Problems Maternal Grandfather     No Known Problems Paternal Grandmother     No Known Problems Paternal Grandfather     No Known Problems Other     Anesthesia problems Neg Hx     Broken bones Neg Hx     Clotting disorder Neg Hx     Collagen disease Neg Hx     Diabetes Neg Hx     Dislocations Neg Hx     Fibromyalgia Neg Hx     Gout Neg Hx     Hemophilia Neg Hx     Osteoporosis Neg Hx     Rheumatologic disease Neg Hx     Scoliosis Neg Hx     Severe sprains Neg Hx     Sickle cell anemia Neg Hx     Spinal Compression Fracture Neg Hx     GU problems Neg Hx     Kidney cancer Neg Hx     Prostate cancer Neg Hx    [4]   Past Surgical History:  Procedure Laterality Date    BACK SURGERY  1995    CARPAL TUNNEL RELEASE Left 2014    HYSTERECTOMY  1971    IR INSERT CHOLECYSTOSTOMY TUBE PERCUTANEOUS  10/02/2020    IR INSERT CHOLECYSTOSMY TUBE PERCUTANEOUS 10/02/2020 Ivonne Butler Seed, MD IMG VIR [facility]    LUMBAR DISC SURGERY      PR REMOVAL GALLBLADDER N/A 10/06/2020    Procedure: CHOLECYSTECTOMY;  Surgeon: Curtistine Mira Dunnings, MD;  Location: MAIN OR [facility];  Service: Trauma    PR RIGHT HEART CATH O2 SATURATION & CARDIAC OUTPUT N/A 09/30/2020    Procedure: Right Heart Catheterization;  Surgeon: Reyes Jerilynn Sage, MD;  Location: [facility] CATH; Service: Cardiology    PR RIGHT HEART CATH O2 SATURATION & CARDIAC OUTPUT N/A 01/09/2021    Procedure: Right Heart Catheterization;  Surgeon: Norleen Deward Grumbling, MD;  Location: [facility] CATH;  Service: Cardiology

## 2025-01-26 NOTE — Plan of Care (Signed)
 Assessment: Barbara Huber is a 84 y.o. female with weakness    Patient has been stable during the shift. No acute events at this point. Discharge education completed at bedside by RN with family present. All questions encouraged and answered during interactions.    PICU - LE:EPRL-LE: #3 (Non-invasive resp support with =/< 60%, baseline pulmonary support or EVD cleared by neurosurgery)  Social: The family has visited and they have been updated on the patient's plan and shift goals by nursing staff this shift.     Past Medical History[1]    INFUSIONS:  Infusions Meds[2]    VITALS:  Dosing Weight:    Vitals:    01/24/25 0935   Weight: 74.4 kg (164 lb 0.4 oz)      Weight change:      Temp:  [36.2 ??C (97.2 ??F)-36.4 ??C (97.6 ??F)] 36.4 ??C (97.6 ??F)  Pulse:  [64-79] 71  Resp:  [16-20] 20  BP: (104-140)/(52-71) 137/68  MAP (mmHg):  [74-91] 85  FiO2 (%):  [32 %] 32 %  SpO2:  [99 %-100 %] 100 %     VENT SETTINGS:  FiO2 (%): 32 %    INS & OUTS:  No intake/output data recorded.    Date 01/25/25 1901 - 01/26/25 0700 01/26/25 0701 - 01/27/25 0700   Shift 1901-0700 24 Hour Total 0701-1900 1901-0700 24 Hour Total   INTAKE   Shift Total        OUTPUT   Urine 700 700      Shift Total 700 700          ACCESS/LDA:  Patient Lines/Drains/Airways Status       Active Active Lines, Drains, & Airways       Name Placement date Placement time Site Days    External Urinary Device 01/23/25 With Suction 01/23/25  2237  -- 2                     Norine Barrier, RN  January 26, 2025 12:43 PM      Problem: Adult Inpatient Plan of Care  Goal: Absence of Hospital-Acquired Illness or Injury  Outcome: Shift Focus  Intervention: Identify and Manage Fall Risk  Recent Flowsheet Documentation  Taken 01/26/2025 0800 by Barrier Norine, RN  Safety Interventions:   aspiration precautions   bleeding precautions   delirium precautions   fall reduction program maintained   family at bedside   infection management   lighting adjusted for tasks/safety   low bed nonskid shoes/slippers when out of bed  Intervention: Prevent Infection  Recent Flowsheet Documentation  Taken 01/26/2025 0800 by Barrier Norine, RN  Infection Prevention:   environmental surveillance performed   equipment surfaces disinfected   hand hygiene promoted   personal protective equipment utilized   rest/sleep promoted   single patient room provided  Goal: Optimal Comfort and Wellbeing  Outcome: Shift Focus     Problem: Infection  Goal: Absence of Infection Signs and Symptoms  Intervention: Prevent or Manage Infection  Recent Flowsheet Documentation  Taken 01/26/2025 0800 by Barrier Norine, RN  Infection Management: aseptic technique maintained     Problem: Self-Care Deficit  Goal: Improved Ability to Complete Activities of Daily Living  Outcome: Shift Focus     Problem: Fall Injury Risk  Goal: Absence of Fall and Fall-Related Injury  Outcome: Shift Focus  Intervention: Promote Injury-Free Environment  Recent Flowsheet Documentation  Taken 01/26/2025 0800 by Barrier Norine, RN  Safety Interventions:   aspiration precautions  bleeding precautions   delirium precautions   fall reduction program maintained   family at bedside   infection management   lighting adjusted for tasks/safety   low bed   nonskid shoes/slippers when out of bed     Problem: Swallowing Impairment  Goal: Optimal Eating and Swallowing Without Aspiration  Outcome: Shift Focus  Intervention: Optimize Eating and Swallowing  Recent Flowsheet Documentation  Taken 01/26/2025 0800 by Colette Farr, RN  Aspiration Precautions:   awake/alert before oral intake   upright posture maintained          [1]   Past Medical History:  Diagnosis Date    Acute exacerbation of CHF (congestive heart failure) (CMS-HCC) 08/19/2023    Acute exacerbation of CHF (congestive heart failure) (CMS-HCC) 08/19/2023    Acute kidney injury superimposed on chronic kidney disease 10/11/2020    Acute on chronic diastolic (congestive) heart failure (CMS-HCC) 08/23/2020 Acute on chronic diastolic congestive heart failure (CMS-HCC) 08/23/2020    AKI (acute kidney injury) 04/14/2015    Lab Results  Component  Value  Date     CREATININE  1.90 (H)  06/12/2021     Had a bump in her creatinine when she was taking her diuretics every day.  She is currently taking 40 mg daily of torsemide  and 50 mg of spironolactone .  Her volume status is fragile.  Previously when she stopped her diuretic she becomes short of breath.  Plan: We will check her BMP today.  We will likely have to go to 40    AKI (acute kidney injury) 04/14/2015    Lab Results      Component    Value    Date           CREATININE    1.90 (H)    06/12/2021     Had a bump in her creatinine when she was taking her diuretics every day.  She is currently taking 40 mg daily of torsemide  and 50 mg of spironolactone .  Her volume status is fragile.  Previously when she stopped her diuretic she becomes short of breath.  Plan: We will check her BMP today.  We will likel    Anemia 08/22/2019    Iron  deficiency anemia          Lab Results      Component    Value    Date           WBC    9.2    03/21/2021           RBC    3.67 (L)    03/21/2021           HGB    9.9 (L)    03/21/2021           HCT    30.7 (L)    03/21/2021           MCV    83.7    03/21/2021           MCH    26.9    03/21/2021           MCHC    32.1    03/21/2021           RDW    21.9 (H)    03/21/2021           PLT    318     Arthritis     Calculus of kidney     Calculus of ureter  CHF (congestive heart failure) (CMS-HCC)     Chronic atrial fibrillation    (CMS-HCC) 07/20/2019    COPD (chronic obstructive pulmonary disease) (CMS-HCC)     Diabetes (CMS-HCC)     Gangrenous cholecystitis 10/11/2020    Generalized edema  06/17/2021    GERD (gastroesophageal reflux disease)     Hydronephrosis     Hypertension     Hyponatremia 10/11/2020    Intermediate coronary syndrome (CMS-HCC) 03/13/2014    Lower extremity edema 09/28/2020    Lumbar stenosis     Microscopic hematuria Nausea alone     Nephrolithiasis 04/17/2016    Neuropathy     Nocturia     Nocturia 07/01/2017    Other chronic cystitis     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  See plans for anemia, AKI  We are also tapering her gabapentin .  Currently on 300 mg 3 times daily.  We will decrease it to twice daily, and then nightly.  She states she is not having any recurrence of her pain.    Pulmonary hypertension    (CMS-HCC)     Renal colic     Shortness of breath 04/28/2020    Sleep apnea     Type 2 diabetes mellitus with stage 4 chronic kidney disease, with long-term current use of insulin  (CMS-HCC) 04/14/2015    Lab Results      Component    Value    Date           A1C    4.8    03/03/2024           A1C    5.8 (H)    08/30/2023           A1C    5.8 (H)    04/14/2023     Diabetes, off meds, blood sugars have been in range  Plan: check A1C in 3 months            Unstable angina pectoris    (CMS-HCC) 03/13/2014   [2]

## 2025-01-26 NOTE — Plan of Care (Signed)
 Shift Summary  guaiFENesin  was administered PRN for congestion, supporting comfort and respiratory wellbeing.  Patient was able to feed herself and received family support during the shift.  Safety interventions, including fall reduction program, alarms, and bed rails, were maintained throughout the shift.  Patient was repositioned regularly to support skin integrity and comfort.  Patient remained alert and able to follow commands, with adequate nutrition and stable glucose level noted.    Absence of Hospital-Acquired Illness or Injury: Fall reduction program and safety interventions were consistently maintained throughout the shift, with alarms activated and bed rails up; patient was repositioned regularly and incontinence pads were utilized to support skin protection.    Optimal Comfort and Wellbeing: Patient was able to feed herself and family visited during the evening; guaiFENesin  was administered PRN for congestion, and patient remained able to follow commands and demonstrated adequate nutrition.

## 2025-01-26 NOTE — Discharge Summary (Signed)
 Physician Discharge Summary HBR  4 BT[address] [facility]  [address]  [place] [address]  Dept: [phone]  Loc: [phone]     Identifying Information:   Barbara Huber  11-11-1941  [id number]    Primary Care Physician: [name], MD   Code Status: Full Code    Admit Date: 01/23/2025    Discharge Date: 01/26/2025     Discharge To: Home with Home Health and/or PT/OT    Discharge Service: HBR - HBR: Hospitalist [facility]     Discharge Attending Physician: No att. providers found    Discharge Diagnoses:  Principal Problem:    Weakness (POA: Yes)  Active Problems:    COPD (chronic obstructive pulmonary disease) (CMS-HCC) (POA: Yes)    Sleep apnea in adult (POA: Yes)    Chronic cystitis (POA: Yes)    Chronic respiratory failure with hypoxia    (CMS-HCC) (POA: Yes)    Chronic kidney disease ([name]), stage IV (severe)    (CMS-HCC) (POA: Yes)    Pulmonary hypertension    (CMS-HCC) (POA: Yes)    Chronic heart failure with preserved ejection fraction ([name]) (CMS-HCC) (POA: Yes)    Cardiac amyloidosis (CMS-HCC) (POA: Yes)  Resolved Problems:    AKI (acute kidney injury) (POA: Yes)      Outpatient Provider Follow Up Issues:   PCP: Hospital follow up, weakness. Likely deconditioning + pneumonia. CT concerning for severe aspiration, but no overt aspiration at bedside. Pulm recommended 10 days minocycline  for possible stenotrophomonas maltophilia infection vs colonization  Possible AKI on admit, but suspect her Cr represents current baseline as noted below.     Pulm: Hospital follow up- CT concerning for severe aspiration, but no overt aspiration at bedside. Pulm recommended 10 days minocycline  for possible stenotrophomonas maltophilia infection vs colonization. Dc with neb saline and aerobika.     Hospital Course:   High-Level Summary   Barbara Huber is an 84 year old female with a history of COPD, chronic hypoxic respiratory failure, cardiac amyloidosis, chronic heart failure with preserved ejection fraction ([name]), and stage IV chronic kidney disease ([name]), who was admitted with generalized weakness and acute kidney injury (AKI) in the context of recent respiratory infection, poor oral intake, and weight loss.    [facility] Course    **1. Generalized Weakness and Functional Decline**   She presented with acute worsening of baseline weakness, particularly in her legs, resulting in inability to ambulate independently and increased need for assistance with activities of daily living (ADLs). This was associated with poor oral intake, subjective wt loss, weight loss (down to 164 lbs from a prior dry wt of 166 at dc 12/27), and recent viral URI, followed by bacterial infection. Physical and occupational therapy evaluations revealed significant deficits in strength, endurance, and balance, with high fall risk and need for moderate to maximal assistance for transfers and ADLs. She required supervision for all mobility and was recommended for ongoing low-intensity PT/OT post-discharge. She declined recommendation of SNF for PT/OT and opted to dc home.  Her functional status was below baseline, and she was primarily using a rollator for short distances and a wheelchair for community mobility prior to admission.    **2.Chronic Kidney Disease (Stage IV)**   She has a history of [name] stage IV with baseline creatinine typically 2-2.6 mg/dL, and presented with a creatinine of 2.64 mg/dL, which was within her recent range but technically met criteria for AKI with 1/10 Cr being 1.8 in the setting of dilution/volume overload. Euvolemic on exam. Diuretics  were held initially and then resumed once renal function stabilized. Nephrotoxic agents were avoided and medications were renally dosed as appropriate.    **3. Cardiac Amyloidosis, [name], and Cardiorenal Syndrome**   She has a history of cardiac amyloidosis and [name], with prior admissions for volume overload and cardiorenal syndrome. On admission, she appeared euvolemic, with weight slightly below estimated dry weight and no significant edema. She received a 500 mL IV fluid bolus in the ED, resulting in no change to renal function. Diuretics were held initially, resumed when Cr began to elevate. Cr improved after resuming diuresis, implying cardiorenal etiology. She is not on anticoagulation due to anemia. Tafamidis  was continued during hospitalization.    **4. COPD, Chronic Hypoxic Respiratory Failure, Pulmonary Hypertension, and Recent Respiratory Infection**   She has chronic hypoxic respiratory failure (on 3L O2 at baseline), COPD, and pulmonary hypertension. She was recently treated for pneumonia and coronavirus NL63 infection with multiple antibiotics, including Augmentin , doxycycline , levofloxacin  (discontinued due to neuropathic pain), and minocycline . Outpatient sputum culture grew Stenotrophomonas maltophilia; minocycline  was continued as the preferred oral agent given renal considerations. She took 2 days of prescribed course and stopped due to side effect of general malaise.  She had persistent cough but no increased oxygen  requirement or respiratory distress during admission. Airway clearance with albuterol  and hypertonic saline was initiated. Pulmonology was consulted and recommended a 10-day course of minocycline  if tolerated, airway clearance, and SLP evaluation for dysphagia. She remained on home oxygen  and inhaled therapies, with no evidence of worsening hypoxia or increased oxygen  needs.    **5. Deconditioning and Rehabilitation Needs**   Her hospitalization was complicated by significant deconditioning, with impaired mobility, endurance, and ADL performance. She required moderate to maximal assistance for transfers and ambulation, and total assistance for toileting and lower body dressing. She was recommended for ongoing PT and OT at 5x low, however they declined this and opted for home with [facility] post-discharge    **6. Nutrition and Weight Loss**   Daughter concerned about wt loss, reporting 7 lb loss, however this was based on wt obtained in ED, which was inaccurate. Pt's dry wt at dc on 12/27 was 166 lbs, wt this admission 164 lbs. Weight of 170 lbs was in the setting of volume overload.    May have mild protein-calorie malnutrition in the context of acute illness. Oral nutritional supplements were recommended, and her intake improved during admission. She was encouraged to meet at least 75% of nutritional needs via meals and supplements.  Wt Readings from Last 6 Encounters:   01/24/25 74.4 kg (164 lb 0.4 oz)   01/08/25 74.4 kg (164 lb)   01/06/25 77.1 kg (170 lb)   12/25/24 77.1 kg (170 lb)   12/23/24 77.2 kg (170 lb 1.6 oz)   12/13/24 75.3 kg (166 lb)        **7. Chronic Cystitis and Other Comorbidities** Questionable dx of DM   Her history includes chronic cystitis, diabetes, hypertension, atrial fibrillation, and prior anemia. These were not acutely exacerbated during this admission but remain relevant to her overall care and medication management.  Pt not on medications for DM, glucose normal throughout stay. Most recent A1C 4.6%, has not     **8. Sleep Apnea**   She has a history of sleep apnea and uses AVAPS at night as part of her home respiratory regimen.    **9. Safety and Discharge Planning**   She was maintained on fall precautions throughout her stay. Discharge planning included arrangements for home  health services, PT/OT, and caregiver support. She met Medicare homebound criteria and was provided with a list of post-acute care providers. Her daughter is her healthcare power of attorney and primary caregiver.    **10. Code Status**   She is full code, with healthcare power of attorney documentation on file.    **11. Medication Allergies**   She has documented allergies to nitrofurantoin (anaphylaxis) and atorvastatin  (muscle pain).    **12. 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    **13. Hospital Course Complications**   No hospital-acquired complications or injuries were reported during this admission.    **14. Advance Care Planning**   Her goals of care and code status were reviewed and confirmed with her and her daughter during admission.    **15. R lateral knee pain.   No known injury. Began at least a week ago. Xray without acute injury, no effusion, mild tricompartmental arthritis. Home PT arranged. Follow up with PCP.    **16. Discharge Needs**   She will require ongoing PT/OT, home health, and caregiver support due to persistent functional deficits and high fall risk.     Procedures:  None  No admission procedures for hospital encounter.  ______________________________________________________________________  Discharge Medications:     Your Medication List        PAUSE taking these medications      metOLazone  5 MG tablet  Wait to take this until your doctor or other care provider tells you to start again.  Commonly known as: ZAROXOLYN   Take 1 tablet (5 mg total) by mouth daily as needed (up to 3x weekly when instructed by cardiology clinic).            CHANGE how you take these medications      fluconazole  150 MG tablet  Commonly known as: DIFLUCAN   Take 1 tablet (150 mg total) by mouth once as needed (use as needed for yeast) for up to 2 doses. Can take second dose if symptoms persist after 72hrs.  What changed: reasons to take this     OXYGEN -AIR DELIVERY SYSTEMS MISC  4 L by Miscellaneous route continuous.  What changed: how much to take     sodium chloride  3 % NEBULIZER solution  Inhale 4 mL by nebulization two (2) times a day as needed for other. Use 2 puffs of albuterol  followed by nebulization of saline. Use BID until your congestion improves.  What changed:   when to take this  additional instructions     sodium chloride  0.9 % nebulizer solution  Inhale 3 mL by nebulization two (2) times a day as needed for wheezing (Use 0.9% saline if you have ongoing throat irritation with the 3% saline.).  What changed: You were already taking a medication with the same name, and this prescription was added. Make sure you understand how and when to take each.            CONTINUE taking these medications      ACCU-CHEK GUIDE TEST STRIPS Strp  Generic drug: blood sugar diagnostic  Use to check blood sugar daily     ACCU-CHEK SOFTCLIX LANCETS lancets  Generic drug: lancets  CHECK BLOOD SUGAR DAILY     acetaminophen  500 MG tablet  Commonly known as: TYLENOL   Take 2 tablets (1,000 mg total) by mouth Three (3) times a day as needed for pain.     ADVAIR  HFA 115-21 mcg/actuation  inhaler  Generic drug: fluticasone  propion-salmeterol  INHALE 2 PUFFS TWICE A DAY     AEROCHAMBER MV inhaler  Generic drug: inhalational spacing device  1 spacer for inhaler     albuterol  2.5 mg /3 mL (0.083 %) nebulizer solution  Inhale 3 mL by nebulization every six (6) hours as needed for wheezing.     albuterol  90 mcg/actuation inhaler  Commonly known as: PROVENTIL  HFA;VENTOLIN  HFA  Inhale 1 puff every six (6) hours as needed for wheezing.     amiodarone  200 MG tablet  Commonly known as: PACERONE   Take 1 tablet (200 mg total) by mouth daily.     AMVUTTRA  25 mg/0.5 mL injection  Generic drug: vutrisiran   Inject 0.5 mL (25 mg total) under the skin Take as directed. Every 6 Weeks     cholecalciferol  (vitamin D3-50 mcg (2,000 unit)) 50 mcg (2,000 unit) Cap  Take 1 capsule (50 mcg total) by mouth in the morning.     cranberry 500 mg Cap  Take 500 mg by mouth daily with evening meal.     cyclobenzaprine  10 MG tablet  Commonly known as: FLEXERIL   Take 1 tablet (10 mg total) by mouth two (2) times a day as needed for muscle spasms.     ferrous sulfate  325 (65 FE) MG tablet  Take 1 tablet (325 mg total) by mouth in the morning.     fosfomycin  3 gram Pack  Commonly known as: MONUROL   Take 3 g by mouth once a week.     gabapentin  100 MG capsule  Commonly known as: NEURONTIN   Take 1 capsule (100 mg total) by mouth Three (3) times a day.     HYDROcodone -chlorpheniramine  polistirex 10-8 mg/5 mL ER suspension  Commonly known as: TUSSIONEX PENNKINETIC   Take 5 mL by mouth every twelve (12) hours as needed for cough.     LIDOCAINE  PAIN RELIEF  4 % patch  Generic drug: lidocaine   Place 1 patch on the skin daily. Remove after 12 hours.     midodrine  5 MG tablet  Commonly known as: PROAMATINE   Take 1 tablet (5 mg total) by mouth three (3) times a day.     minocycline  100 MG capsule  Commonly known as: MINOCIN   Take 2 capsules (200 mg total) by mouth two (2) times a day for 7 days.     NARCAN 4 mg/actuation nasal spray  Generic drug: naloxone  1 spray into alternating nostrils once as needed (opioid overdose). PRN - Emergency use.     nystatin  100,000 unit/gram powder  Commonly known as: MYCOSTATIN   Apply to affected area 3 times daily     ondansetron  4 MG disintegrating tablet  Commonly known as: ZOFRAN -ODT  Dissolve 1 tablet (4 mg total) in the mouth every eight (8) hours as needed for nausea.     oxyCODONE  5 MG immediate release tablet  Commonly known as: ROXICODONE   Take 1 tablet (5 mg total) by mouth every eight (8) hours as needed for pain.     pantoprazole  20 MG tablet  Commonly known as: Protonix   TAKE 1 TABLET ONCE DAILY     rosuvastatin  5 MG tablet  Commonly known as: CRESTOR   TAKE 1 TABLET EVERY OTHER DAY     SPIRIVA  RESPIMAT 2.5 mcg/actuation inhalation mist  Generic drug: tiotropium bromide   Inhale 2 puffs daily.     spironolactone  25 MG tablet  Commonly known as: ALDACTONE   Take 0.5 tablets (12.5 mg total) by mouth daily.     torsemide   20 MG tablet  Commonly known as: DEMADEX   Take 5 tablets (100 mg total) by mouth every morning AND 2 tablets (40 mg total) daily with lunch. Take with food.     vitamin A -3,000 mcg RAE (10,000 UNIT) 3,000 mcg RAE (10,000 UNIT) capsule  Take 1 capsule (3,000 mcg of RAE total) by mouth daily.     VYNDAMAX  61 mg Cap  Generic drug: tafamidis   Take 1 capsule (61 mg) by mouth daily.              Allergies:  Nitrofurantoin and Lipitor  [atorvastatin ]  ______________________________________________________________________  Pending Test Results (if blank, then none):  Pending Labs       Order Current Status    Lower Respiratory Culture Preliminary result            Most Recent Labs:  All lab results last 24 hours -   Recent Results (from the past 24 hours)   POCT Glucose    Collection Time: 01/25/25  4:52 PM   Result Value Ref Range    Glucose, POC 117 70 - 179 mg/dL   POCT Glucose    Collection Time: 01/25/25  8:04 PM   Result Value Ref Range    Glucose, POC 125 70 - 179 mg/dL   CBC    Collection Time: 01/26/25  6:37 AM   Result Value Ref Range    WBC 9.1 3.6 - 11.2 10*9/L    RBC 3.69 (L) 3.95 - 5.13 10*12/L    HGB 11.5 11.3 - 14.9 g/dL    HCT 66.4 (L) 65.9 - 44.0 %    MCV 90.8 77.6 - 95.7 fL    MCH 31.1 25.9 - 32.4 pg    MCHC 34.3 32.0 - 36.0 g/dL    RDW 85.6 87.7 - 84.7 %    MPV 9.6 6.8 - 10.7 fL    Platelet 127 (L) 150 - 450 10*9/L   Basic metabolic panel    Collection Time: 01/26/25  6:37 AM   Result Value Ref Range    Sodium 137 135 - 145 mmol/L    Potassium 4.2 3.4 - 4.8 mmol/L    Chloride 96 (L) 98 - 107 mmol/L    CO2 24.0 20.0 - 31.0 mmol/L    Anion Gap 17 (H) 5 - 14 mmol/L    BUN 82 (H) 9 - 23 mg/dL    Creatinine 7.57 (H) 0.55 - 1.02 mg/dL    BUN/Creatinine Ratio 34     eGFR [name]-EPI (2021) Female 19 (L) >=60 mL/min/1.82m2    Glucose 90 70 - 179 mg/dL    Calcium  9.1 8.7 - 10.4 mg/dL   Magnesium  Level    Collection Time: 01/26/25  6:37 AM   Result Value Ref Range    Magnesium  2.4 1.6 - 2.6 mg/dL   Phosphorus Level    Collection Time: 01/26/25  6:37 AM   Result Value Ref Range    Phosphorus 4.4 2.4 - 5.1 mg/dL   POCT Glucose    Collection Time: 01/26/25  7:57 AM   Result Value Ref Range    Glucose, POC 74 70 - 179 mg/dL       Relevant Studies/Radiology (if blank, then none):  XR Knee 3 Views Right  Result Date: 01/26/2025  EXAM: XR KNEE 3 VIEWS RIGHT DATE: 01/26/2025 8:39 AM ACCESSION: [id number] UN DICTATED: 01/26/2025 8:36 AM INTERPRETATION LOCATION: [facility]     CLINICAL INDICATION: 84 years old Female with lateral knee pain    COMPARISON: None.  TECHNIQUE: AP and lateral views of the right knee. No views of the left knee.     FINDINGS:     No acute fracture or dislocation.     No aggressive osseous lesion. Diffuse osseous osteopenia. Sequelae of an old, healed fracture involving the fibular neck.     No joint effusion.     Mild tricompartmental joint space narrowing with tiny osteophytes, most pronounced in the medial compartment, consistent with mild degenerative change.     Extensive vascular calcifications.         1.  No acute fracture or dislocation. 2.  No joint effusion. 3.  Mild tricompartmental osteoarthrosis. 4.  Extensive vascular calcifications.    CT Chest Wo Contrast  Result Date: 01/24/2025  EXAM: CT CHEST WO CONTRAST ACCESSION: [id number] UN REPORT DATE: 01/24/2025 6:07 PM     CLINICAL INDICATION: eval atypical infection     TECHNIQUE: Contiguous noncontrast axial images were reconstructed through the chest following a single breath hold helical acquisition. Images were reformatted in the axial. coronal, and sagittal planes. MIP slabs were also constructed.     COMPARISON: XR CHEST 2 VIEWS 01/06/2025     FINDINGS:     LUNGS/AIRWAYS/PLEURA: Trachea is patent. There is extensive endobronchial secretions in the left mainstem bronchus extending into the left lower lobe airways with associated bronchial wall thickening. There is peripheral nodular consolidation. There is subsegmental atelectasis in the lingula. Upper lung predominant centrilobular and paraseptal emphysema there is scattered nonspecific pulmonary micronodules. No pleural effusion or pneumothorax.     MEDIASTINUM/THORACIC INLET: Multiple borderline enlarged mediastinal lymph nodes. Patulous fluid-filled esophagus. No thyroid abnormality.     HEART/VASCULATURE: Left atrial enlargement. Mitral annular calcification. Moderate coronary calcification. No pericardial effusion. Aorta is normal in caliber. Main pulmonary artery is normal in size.     DIAPHRAGM/UPPER ABDOMEN: Diaphragm intact and normally positioned. No acute findings in the minimally visualized upper abdomen. Aortic calcification.     CHEST WALL/BONES: No enlarged axillary lymph nodes. Degenerative changes are present. Decreased osseous mineralization with multilevel compression deformities. No suspicious focal lytic or sclerotic lesions.     DEVICES: None.         Extensive secretions in the left mainstem bronchus extending into left lower lobe with associated aspiration or developing pneumonia.     Emphysema with scattered nonspecific pulmonary micronodules. Consider follow-up CT chest in 1 year.                                                ______________________________________________________________________  Discharge Instructions:         Follow Up instructions and Outpatient Referrals     Ambulatory Referral to Home Health      Reason for referral: PT and OT    Is this a [facility] or [facility] Patient?: Yes    Do you want agency provider parameter notifications or patient specific   provider parameter notifications?: Agency    Do you want to initiate remote patient monitoring?: Yes    Physician to follow patient's care: PCP    Disciplines requested:  Physical Therapy  Occupational Therapy       Physical Therapy requested: Evaluate and treat    Occupational Therapy Requested: Evaluate and treat    Discharge instructions          Other Instructions  Discharge instructions      You are already scheduled for a follow up appointment with Dr. Myrna on 2/16. I have requested a pulmonology follow up for you, they should call you to schedule this.     You should take minocycline  for the 7 days. If you are unable to tolerate this, it's okay to stop, but please call Dr. Myrna and Dr. Janie to notify them. You should continue airway clearance with  2 puffs of albuterol  followed by saline nebulizer twice a day while you are still sick and producing mucus. You should also use your aerobika twice a day. I recommend you continue this even after recovery.     Your knee xray shows mild arthritis. Physical therapy and tylenol  are recommended. You should also follow up with Dr. Myrna about this.     Speech therapy has recommended outpatient speech therapy. We will place a referral for this.            Appointments which have been scheduled for you      Feb 09, 2025 1:30 PM  (Arrive by 1:15 PM)  RETURN HEART FAILURE with Damien Brodie Lee, AGNP  [facility] CARDIOLOGY [place] [facility]) [address]  [facility] [address]  [place] [address]  [phone]        Feb 12, 2025 4:10 PM  (Arrive by 3:55 PM)  RETURN VIDEO VISIT MYCHART with Velia Corena Myrna, MD  [facility] INTERNAL MEDICINE [place]  [facility] REGION) [address]  [facility] [address]  [place] [address]  [phone]   Please sign into My [place] Chart at least 15 minutes before your appointment to complete the eCheck-In process. You must complete eCheck-In before you can start your video visit. We also recommend testing your audio and video connection to troubleshoot any issues before your visit begins. Click ???Join Video Visit??? to complete these checks. Once you have completed eCheck-In and tested your audio and video, click ???Join Call??? to connect to your visit.     For your video visit, you will need a computer with a working camera, speaker and microphone, a smartphone, or a tablet with internet access.    My [place] Chart enables you to manage your health, send non-urgent messages to your provider, view your test results, schedule and manage appointments, and request prescription refills securely and conveniently from your computer or mobile device.    You can go to [email or web address] to sign in to your My [place] Chart account with your username and password. If you have forgotten your username or password, please choose the ???Forgot Username???? and/or ???Forgot Password???? links to gain access. You also can access your My [place] Chart account with the free MyChart mobile app for Android or iPhone.    If you need assistance accessing your My [place] Chart account or for assistance in reaching your provider's office to reschedule or cancel your appointment, please call [facility] [phone].              ______________________________________________________________________  Discharge Day Services:  BP 137/68  - Pulse 71  - Temp 36.4 ??C (97.6 ??F) (Tympanic)  - Resp 20  - Ht 167.6 cm (5' 6)  - Wt 74.4 kg (164 lb 0.4 oz)  - LMP  (LMP Unknown)  - SpO2 100%  - BMI 26.47 kg/m??   Pt seen on the day of discharge and determined appropriate for discharge.  Condition at Discharge: stable    Length of Discharge: I spent 60 mins in the discharge of this patient.

## 2025-01-29 DIAGNOSIS — Z09 Encounter for follow-up examination after completed treatment for conditions other than malignant neoplasm: Principal | ICD-10-CM

## 2025-01-29 NOTE — Progress Notes (Signed)
 [facility] Refill Coordination Note    Specialty Medication(s) to be Shipped:   Cardiology: Vyndamax     Other medication(s) to be shipped: No additional medications requested for fill at this time    Specialty Medications not needed at this time: N/A     Barbara Huber, DOB: May 15, 1941  Phone: [phone] (home)       All above HIPAA information was verified with patient's family member, Spoke with Barbara Huber daughter today.     Was a nurse, learning disability used for this call? No    Completed refill call assessment today to schedule patient's medication shipment from the [facility]  [phone]).  All relevant notes have been reviewed.     Specialty medication(s) and dose(s) confirmed: Regimen is correct and unchanged.   Changes to medications: Barbara Huber reports no changes at this time.  Changes to insurance: No  New side effects reported not previously addressed with a pharmacist or physician: None reported  Questions for the pharmacist: No    Confirmed patient received a Conservation Officer, Historic Buildings and a Surveyor, Mining with first shipment. The patient will receive a drug information handout for each medication shipped and additional FDA Medication Guides as required.       DISEASE/MEDICATION-SPECIFIC INFORMATION        N/A    SPECIALTY MEDICATION ADHERENCE              Were doses missed due to medication being on hold? No    Vyndamax  61 mg: 14 doses of medicine on hand     Specialty medication is an injection or given on a cycle: No    REFERRAL TO PHARMACIST     Referral to the pharmacist: Not needed      [facility]     Shipping address confirmed in Epic.     Cost and Payment: Patient has a copay of $12.65. They are aware and have authorized the pharmacy to charge the credit card on file.    Delivery Scheduled: Yes, Expected medication delivery date: 02/07/25.     Medication will be delivered via Next Day Courier to the prescription address in Epic WAM.    Barbara Huber, [facility]   [facility]  Specialty Pharmacist

## 2025-02-02 ENCOUNTER — Inpatient Hospital Stay: Admission: EM | Admit: 2025-02-02 | Source: Home / Self Care | Attending: Family Medicine | Admitting: Family Medicine

## 2025-02-02 ENCOUNTER — Other Ambulatory Visit: Payer: Self-pay

## 2025-02-02 ENCOUNTER — Emergency Department

## 2025-02-02 ENCOUNTER — Inpatient Hospital Stay

## 2025-02-02 DIAGNOSIS — N184 Chronic kidney disease, stage 4 (severe): Secondary | ICD-10-CM | POA: Diagnosis present

## 2025-02-02 DIAGNOSIS — A419 Sepsis, unspecified organism: Principal | ICD-10-CM | POA: Diagnosis present

## 2025-02-02 DIAGNOSIS — R7989 Other specified abnormal findings of blood chemistry: Secondary | ICD-10-CM

## 2025-02-02 DIAGNOSIS — N189 Chronic kidney disease, unspecified: Secondary | ICD-10-CM

## 2025-02-02 DIAGNOSIS — J432 Centrilobular emphysema: Secondary | ICD-10-CM

## 2025-02-02 DIAGNOSIS — N390 Urinary tract infection, site not specified: Secondary | ICD-10-CM

## 2025-02-02 DIAGNOSIS — I480 Paroxysmal atrial fibrillation: Secondary | ICD-10-CM

## 2025-02-02 DIAGNOSIS — M51369 Other intervertebral disc degeneration, lumbar region without mention of lumbar back pain or lower extremity pain: Secondary | ICD-10-CM

## 2025-02-02 DIAGNOSIS — J189 Pneumonia, unspecified organism: Secondary | ICD-10-CM | POA: Diagnosis present

## 2025-02-02 DIAGNOSIS — R531 Weakness: Secondary | ICD-10-CM

## 2025-02-02 DIAGNOSIS — N179 Acute kidney failure, unspecified: Secondary | ICD-10-CM

## 2025-02-02 DIAGNOSIS — I10 Essential (primary) hypertension: Secondary | ICD-10-CM | POA: Insufficient documentation

## 2025-02-02 DIAGNOSIS — E1142 Type 2 diabetes mellitus with diabetic polyneuropathy: Secondary | ICD-10-CM | POA: Insufficient documentation

## 2025-02-02 DIAGNOSIS — I2489 Other forms of acute ischemic heart disease: Secondary | ICD-10-CM

## 2025-02-02 LAB — URINALYSIS, W/ REFLEX TO CULTURE (INFECTION SUSPECTED)
Bilirubin Urine: NEGATIVE
Glucose, UA: NEGATIVE mg/dL
Ketones, ur: NEGATIVE mg/dL
Nitrite: POSITIVE — AB
Protein, ur: NEGATIVE mg/dL
Specific Gravity, Urine: 1.009 (ref 1.005–1.030)
WBC, UA: >50 WBC/hpf (ref 0–5)
pH: 5 (ref 5.0–8.0)

## 2025-02-02 LAB — HEMOGLOBIN A1C
Hgb A1c MFr Bld: 4.8 % (ref 4.8–5.6)
Mean Plasma Glucose: 91.06 mg/dL

## 2025-02-02 LAB — COMPREHENSIVE METABOLIC PANEL WITH GFR
ALT: 8 U/L (ref 0–44)
AST: 30 U/L (ref 15–41)
Albumin: 3 g/dL — ABNORMAL LOW (ref 3.5–5.0)
Alkaline Phosphatase: 119 U/L (ref 38–126)
Anion gap: 15 (ref 5–15)
BUN: 111 mg/dL — ABNORMAL HIGH (ref 8–23)
CO2: 23 mmol/L (ref 22–32)
Calcium: 9 mg/dL (ref 8.9–10.3)
Chloride: 98 mmol/L (ref 98–111)
Creatinine, Ser: 2.61 mg/dL — ABNORMAL HIGH (ref 0.44–1.00)
GFR, Estimated: 18 mL/min — ABNORMAL LOW
Glucose, Bld: 104 mg/dL — ABNORMAL HIGH (ref 70–99)
Potassium: 3.9 mmol/L (ref 3.5–5.1)
Sodium: 136 mmol/L (ref 135–145)
Total Bilirubin: 1.1 mg/dL (ref 0.0–1.2)
Total Protein: 6.5 g/dL (ref 6.5–8.1)

## 2025-02-02 LAB — LACTIC ACID, PLASMA
Lactic Acid, Venous: 3.6 mmol/L (ref 0.5–1.9)
Lactic Acid, Venous: 2.8 mmol/L (ref 0.5–1.9)
Lactic Acid, Venous: 2.7 mmol/L (ref 0.5–1.9)

## 2025-02-02 LAB — CBC WITH DIFFERENTIAL/PLATELET
Abs Immature Granulocytes: 0.04 10*3/uL (ref 0.00–0.07)
Basophils Absolute: 0.1 10*3/uL (ref 0.0–0.1)
Basophils Relative: 1 %
Eosinophils Absolute: 0.2 10*3/uL (ref 0.0–0.5)
Eosinophils Relative: 2 %
HCT: 32.9 % — ABNORMAL LOW (ref 36.0–46.0)
Hemoglobin: 10.8 g/dL — ABNORMAL LOW (ref 12.0–15.0)
Immature Granulocytes: 1 %
Lymphocytes Relative: 12 %
Lymphs Abs: 1 10*3/uL (ref 0.7–4.0)
MCH: 30.1 pg (ref 26.0–34.0)
MCHC: 32.8 g/dL (ref 30.0–36.0)
MCV: 91.6 fL (ref 80.0–100.0)
Monocytes Absolute: 1.1 10*3/uL — ABNORMAL HIGH (ref 0.1–1.0)
Monocytes Relative: 13 %
Neutro Abs: 6.2 10*3/uL (ref 1.7–7.7)
Neutrophils Relative %: 71 %
Platelets: 114 10*3/uL — ABNORMAL LOW (ref 150–400)
RBC: 3.59 MIL/uL — ABNORMAL LOW (ref 3.87–5.11)
RDW: 14.2 % (ref 11.5–15.5)
WBC: 8.5 10*3/uL (ref 4.0–10.5)
nRBC: 2.2 % — ABNORMAL HIGH (ref 0.0–0.2)

## 2025-02-02 LAB — CULTURE, BLOOD (ROUTINE X 2)
Culture: NO GROWTH
Culture: NO GROWTH
Special Requests: ADEQUATE
Special Requests: ADEQUATE

## 2025-02-02 LAB — CBC
HCT: 35.7 % — ABNORMAL LOW (ref 36.0–46.0)
Hemoglobin: 11.7 g/dL — ABNORMAL LOW (ref 12.0–15.0)
MCH: 29.5 pg (ref 26.0–34.0)
MCHC: 32.8 g/dL (ref 30.0–36.0)
MCV: 89.9 fL (ref 80.0–100.0)
Platelets: 115 10*3/uL — ABNORMAL LOW (ref 150–400)
RBC: 3.97 MIL/uL (ref 3.87–5.11)
RDW: 14.3 % (ref 11.5–15.5)
WBC: 9.4 10*3/uL (ref 4.0–10.5)
nRBC: 1.6 % — ABNORMAL HIGH (ref 0.0–0.2)

## 2025-02-02 LAB — RESP PANEL BY RT-PCR (RSV, FLU A&B, COVID)  RVPGX2
Influenza A by PCR: NEGATIVE
Influenza B by PCR: NEGATIVE
Resp Syncytial Virus by PCR: NEGATIVE
SARS Coronavirus 2 by RT PCR: NEGATIVE

## 2025-02-02 LAB — BASIC METABOLIC PANEL WITH GFR
Anion gap: 14 (ref 5–15)
BUN: 109 mg/dL — ABNORMAL HIGH (ref 8–23)
CO2: 23 mmol/L (ref 22–32)
Calcium: 8.8 mg/dL — ABNORMAL LOW (ref 8.9–10.3)
Chloride: 100 mmol/L (ref 98–111)
Creatinine, Ser: 2.51 mg/dL — ABNORMAL HIGH (ref 0.44–1.00)
GFR, Estimated: 18 mL/min — ABNORMAL LOW
Glucose, Bld: 107 mg/dL — ABNORMAL HIGH (ref 70–99)
Potassium: 4.1 mmol/L (ref 3.5–5.1)
Sodium: 137 mmol/L (ref 135–145)

## 2025-02-02 LAB — PROTIME-INR
INR: 1 (ref 0.8–1.2)
INR: 1 (ref 0.8–1.2)
Prothrombin Time: 14.1 s (ref 11.4–15.2)
Prothrombin Time: 14.1 s (ref 11.4–15.2)

## 2025-02-02 LAB — CBG MONITORING, ED
Glucose-Capillary: 90 mg/dL (ref 70–99)
Glucose-Capillary: 125 mg/dL — ABNORMAL HIGH (ref 70–99)
Glucose-Capillary: 147 mg/dL — ABNORMAL HIGH (ref 70–99)

## 2025-02-02 LAB — LIPASE, BLOOD: Lipase: 54 U/L — ABNORMAL HIGH (ref 11–51)

## 2025-02-02 LAB — TROPONIN T, HIGH SENSITIVITY
Troponin T High Sensitivity: 150 ng/L (ref 0–19)
Troponin T High Sensitivity: 150 ng/L (ref 0–19)

## 2025-02-02 LAB — PRO BRAIN NATRIURETIC PEPTIDE: Pro Brain Natriuretic Peptide: 6426 pg/mL — ABNORMAL HIGH

## 2025-02-02 LAB — GLUCOSE, CAPILLARY: Glucose-Capillary: 150 mg/dL — ABNORMAL HIGH (ref 70–99)

## 2025-02-02 LAB — CORTISOL-AM, BLOOD: Cortisol - AM: 21.5 ug/dL (ref 6.7–22.6)

## 2025-02-02 MED ORDER — LACTATED RINGERS IV BOLUS (SEPSIS)
500.0000 mL | Freq: Once | INTRAVENOUS | Status: AC
  Administered 2025-02-02: 500 mL via INTRAVENOUS

## 2025-02-02 MED ORDER — ENOXAPARIN SODIUM 40 MG/0.4ML IJ SOSY: 40.0000 mg | PREFILLED_SYRINGE | INTRAMUSCULAR | Status: DC

## 2025-02-02 MED ORDER — VANCOMYCIN HCL 1750 MG/350ML IV SOLN
1750.0000 mg | Freq: Once | INTRAVENOUS | Status: AC
  Administered 2025-02-02: 1750 mg via INTRAVENOUS
  Filled 2025-02-02: qty 350

## 2025-02-02 MED ORDER — GABAPENTIN 800 MG PO TABS: 400.0000 mg | ORAL_TABLET | Freq: Three times a day (TID) | ORAL | Status: DC

## 2025-02-02 MED ORDER — LACTATED RINGERS IV BOLUS (SEPSIS)
1000.0000 mL | Freq: Once | INTRAVENOUS | Status: AC
  Administered 2025-02-02: 1000 mL via INTRAVENOUS

## 2025-02-02 MED ORDER — METOPROLOL TARTRATE 25 MG PO TABS
12.5000 mg | ORAL_TABLET | Freq: Two times a day (BID) | ORAL | Status: AC
  Administered 2025-02-02 (×2): 12.5 mg via ORAL
  Filled 2025-02-02 (×2): qty 1

## 2025-02-02 MED ORDER — OXYCODONE HCL 5 MG PO TABS
5.0000 mg | ORAL_TABLET | Freq: Four times a day (QID) | ORAL | Status: AC | PRN
  Administered 2025-02-03: 5 mg via ORAL
  Filled 2025-02-02: qty 1

## 2025-02-02 MED ORDER — ONDANSETRON HCL 4 MG PO TABS: 4.0000 mg | ORAL_TABLET | Freq: Four times a day (QID) | ORAL | Status: AC | PRN

## 2025-02-02 MED ORDER — LACTATED RINGERS IV SOLN: INTRAVENOUS | Status: AC

## 2025-02-02 MED ORDER — SODIUM CHLORIDE 0.9 % IV SOLN: 2.0000 g | Freq: Once | INTRAVENOUS | Status: DC

## 2025-02-02 MED ORDER — FOSFOMYCIN TROMETHAMINE 3 G PO PACK
3.0000 g | PACK | Freq: Once | ORAL | Status: AC
  Administered 2025-02-02: 3 g via ORAL
  Filled 2025-02-02: qty 3

## 2025-02-02 MED ORDER — ENOXAPARIN SODIUM 30 MG/0.3ML IJ SOSY
30.0000 mg | PREFILLED_SYRINGE | INTRAMUSCULAR | Status: AC
  Administered 2025-02-02: 30 mg via SUBCUTANEOUS
  Filled 2025-02-02: qty 0.3

## 2025-02-02 MED ORDER — TRAZODONE HCL 50 MG PO TABS: 25.0000 mg | ORAL_TABLET | Freq: Every evening | ORAL | Status: AC | PRN

## 2025-02-02 MED ORDER — VANCOMYCIN HCL IN DEXTROSE 1-5 GM/200ML-% IV SOLN: 1000.0000 mg | Freq: Once | INTRAVENOUS | Status: DC

## 2025-02-02 MED ORDER — FLUTICASONE FUROATE-VILANTEROL 200-25 MCG/ACT IN AEPB
1.0000 | INHALATION_SPRAY | Freq: Every day | RESPIRATORY_TRACT | Status: AC
  Administered 2025-02-02: 1 via RESPIRATORY_TRACT
  Filled 2025-02-02: qty 28

## 2025-02-02 MED ORDER — METHYLPREDNISOLONE SODIUM SUCC 40 MG IJ SOLR
40.0000 mg | INTRAMUSCULAR | Status: AC
  Administered 2025-02-02: 40 mg via INTRAVENOUS
  Filled 2025-02-02: qty 1

## 2025-02-02 MED ORDER — TIOTROPIUM BROMIDE MONOHYDRATE 18 MCG IN CAPS: 18.0000 ug | ORAL_CAPSULE | Freq: Every day | RESPIRATORY_TRACT | Status: DC

## 2025-02-02 MED ORDER — LACTATED RINGERS IV SOLN
150.0000 mL/h | INTRAVENOUS | Status: DC
  Administered 2025-02-02: 150 mL/h via INTRAVENOUS

## 2025-02-02 MED ORDER — UMECLIDINIUM BROMIDE 62.5 MCG/ACT IN AEPB
1.0000 | INHALATION_SPRAY | Freq: Every day | RESPIRATORY_TRACT | Status: AC
  Filled 2025-02-02: qty 7

## 2025-02-02 MED ORDER — SODIUM CHLORIDE 0.9 % IV SOLN
2.0000 g | Freq: Once | INTRAVENOUS | Status: AC
  Administered 2025-02-02: 2 g via INTRAVENOUS
  Filled 2025-02-02: qty 12.5

## 2025-02-02 MED ORDER — ACETAMINOPHEN 650 MG RE SUPP: 650.0000 mg | Freq: Four times a day (QID) | RECTAL | Status: AC | PRN

## 2025-02-02 MED ORDER — AMIODARONE HCL 200 MG PO TABS
200.0000 mg | ORAL_TABLET | Freq: Every day | ORAL | Status: AC
  Administered 2025-02-02: 200 mg via ORAL
  Filled 2025-02-02: qty 1

## 2025-02-02 MED ORDER — MAGNESIUM HYDROXIDE 400 MG/5ML PO SUSP: 30.0000 mL | Freq: Every day | ORAL | Status: AC | PRN

## 2025-02-02 MED ORDER — IPRATROPIUM-ALBUTEROL 0.5-2.5 (3) MG/3ML IN SOLN
3.0000 mL | Freq: Four times a day (QID) | RESPIRATORY_TRACT | Status: AC
  Administered 2025-02-02 (×4): 3 mL via RESPIRATORY_TRACT
  Filled 2025-02-02 (×4): qty 3

## 2025-02-02 MED ORDER — INSULIN ASPART 100 UNIT/ML IJ SOLN
0.0000 [IU] | Freq: Three times a day (TID) | INTRAMUSCULAR | Status: AC
  Administered 2025-02-02: 2 [IU] via SUBCUTANEOUS
  Filled 2025-02-02: qty 2

## 2025-02-02 MED ORDER — IPRATROPIUM-ALBUTEROL 0.5-2.5 (3) MG/3ML IN SOLN: 3.0000 mL | RESPIRATORY_TRACT | Status: AC | PRN

## 2025-02-02 MED ORDER — PANTOPRAZOLE SODIUM 40 MG PO TBEC
40.0000 mg | DELAYED_RELEASE_TABLET | Freq: Two times a day (BID) | ORAL | Status: AC
  Administered 2025-02-02 (×2): 40 mg via ORAL
  Filled 2025-02-02 (×2): qty 1

## 2025-02-02 MED ORDER — FERROUS SULFATE 325 (65 FE) MG PO TABS: 325.0000 mg | ORAL_TABLET | Freq: Every day | ORAL | Status: AC

## 2025-02-02 MED ORDER — ACETAMINOPHEN 325 MG PO TABS
650.0000 mg | ORAL_TABLET | Freq: Four times a day (QID) | ORAL | Status: AC | PRN
  Administered 2025-02-02 (×2): 650 mg via ORAL
  Filled 2025-02-02 (×2): qty 2

## 2025-02-02 MED ORDER — VITAMIN D 25 MCG (1000 UNIT) PO TABS
1000.0000 [IU] | ORAL_TABLET | Freq: Every day | ORAL | Status: AC
  Administered 2025-02-02: 1000 [IU] via ORAL
  Filled 2025-02-02: qty 1

## 2025-02-02 MED ORDER — MIDODRINE HCL 5 MG PO TABS: 5.0000 mg | ORAL_TABLET | Freq: Three times a day (TID) | ORAL | Status: AC

## 2025-02-02 MED ORDER — SODIUM CHLORIDE 0.9 % IV SOLN
100.0000 mg | Freq: Two times a day (BID) | INTRAVENOUS | Status: AC
  Administered 2025-02-02 (×2): 100 mg via INTRAVENOUS
  Filled 2025-02-02 (×3): qty 100

## 2025-02-02 MED ORDER — SODIUM CHLORIDE 0.9 % IV SOLN
500.0000 mg | Freq: Once | INTRAVENOUS | Status: DC
  Filled 2025-02-02: qty 5

## 2025-02-02 MED ORDER — SODIUM CHLORIDE 0.9 % IV SOLN
2.0000 g | INTRAVENOUS | Status: AC
  Administered 2025-02-02: 2 g via INTRAVENOUS
  Filled 2025-02-02: qty 20

## 2025-02-02 MED ORDER — ONDANSETRON HCL 4 MG/2ML IJ SOLN: 4.0000 mg | Freq: Four times a day (QID) | INTRAMUSCULAR | Status: AC | PRN

## 2025-02-02 MED ORDER — HYDROCOD POLI-CHLORPHE POLI ER 10-8 MG/5ML PO SUER: 5.0000 mL | Freq: Two times a day (BID) | ORAL | Status: AC | PRN

## 2025-02-02 MED ORDER — SODIUM CHLORIDE 0.9 % IV BOLUS
500.0000 mL | Freq: Once | INTRAVENOUS | Status: AC
  Administered 2025-02-02: 500 mL via INTRAVENOUS

## 2025-02-02 MED ORDER — ASPIRIN 81 MG PO TBEC
81.0000 mg | DELAYED_RELEASE_TABLET | Freq: Every day | ORAL | Status: DC
  Filled 2025-02-02: qty 1

## 2025-02-02 MED ORDER — GUAIFENESIN ER 600 MG PO TB12
600.0000 mg | ORAL_TABLET | Freq: Two times a day (BID) | ORAL | Status: AC
  Administered 2025-02-02 (×2): 600 mg via ORAL
  Filled 2025-02-02 (×2): qty 1

## 2025-02-02 MED ORDER — INSULIN ASPART 100 UNIT/ML IJ SOLN: 0.0000 [IU] | Freq: Every day | INTRAMUSCULAR | Status: AC

## 2025-02-02 MED ORDER — FOSFOMYCIN TROMETHAMINE 3 G PO PACK: 3.0000 g | PACK | ORAL | Status: AC

## 2025-02-02 MED ORDER — GABAPENTIN 100 MG PO CAPS
100.0000 mg | ORAL_CAPSULE | Freq: Two times a day (BID) | ORAL | Status: AC
  Administered 2025-02-02 (×2): 100 mg via ORAL
  Filled 2025-02-02 (×2): qty 1

## 2025-02-02 NOTE — ED Notes (Signed)
 Lab called with critical lab results of Troponin 150; and Lactic Acid of 3.6.  Dr. Darleene Dome made aware of pts critical lab results.  New orders received.

## 2025-02-02 NOTE — Assessment & Plan Note (Addendum)
-   The patient will be admitted to a medical telemetry bed. - Will continue antibiotic therapy with IV Rocephin  and doxycycline . - Mucolytic therapy be provided as well as duo nebs q.i.d. and q.4 hours p.r.n. - We will follow blood cultures. - Sepsis manifested by tachycardia and mild hypothermia.  RR was 20. -The patient will be hydrated with IV normal saline. - Cautious hydration is provided given her diastolic heart failure.

## 2025-02-02 NOTE — ED Triage Notes (Signed)
 84 y/o F, BIBA, from home w/ c/o increasing weakness at home.  EMS reported pt was orthostatic for them.  Pt received a 500ml bolus of Normal Saline from EMS.

## 2025-02-02 NOTE — Consult Note (Signed)
 " [facility]  CONSULT NOTE    Date: 02/02/2025                  Patient Name:  Megan Bolton  MRN: [id number]  DOB: May 28, 1941  Age / Sex: 84 y.o., female         PCP: Myrna Kay, MD                 Service Requesting Consult: [facility]                 Reason for Consult: Acute kidney injury            History of Present Illness: Ms. Megan Bolton is a 84 y.o.  female with past medical conditions including COPD, diabetes, hypertension, diastolic heart failure, dyslipidemia, and chronic kidney disease, stage IV who was admitted to [facility] on 02/02/2025 for Sepsis due to pneumonia (HCC) [J18.9, A41.9] Pneumonia [J18.9]  Patient presents to the emergency department due to fatigue and weakness.  Patient seen resting on stretcher, no family present.  Patient states she has been experiencing the symptoms for about 1 week.  No known fever or chills.  Does report a productive cough, clear sputum.  Audible wheeze.  Decreased appetite.  Denies nausea, vomiting, or diarrhea.  Poor oral intake.  Labs on ED arrival concerning for BUN 111, creatinine 2.61 with GFR 18, albumin 3.0, troponin 150, lactic acid 3.6, hemoglobin 10.8.  Respiratory panel negative for influenza, COVID-19, and RSV.  UA appears cloudy with hematuria, leukocytes, and nitrites.  Blood cultures pending.  Urine culture pending.  Chest x-ray shows patchy opacities in the right upper lobe.  Renal ultrasound negative for acute findings.   Medications: Outpatient medications: (Not in a hospital admission)   Current medications: Current Facility-Administered Medications  Medication Dose Route Frequency Provider Last Rate Last Admin   acetaminophen  (TYLENOL ) tablet 650 mg  650 mg Oral Q6H PRN Mansy, Jan A, MD   650 mg at 02/02/25 9474   Or   acetaminophen  (TYLENOL ) suppository 650 mg  650 mg Rectal Q6H PRN Mansy, Jan A, MD       amiodarone  (PACERONE ) tablet 200 mg  200 mg Oral Daily Zhang, Dekui, MD   200 mg at  02/02/25 1147   cefTRIAXone  (ROCEPHIN ) 2 g in sodium chloride  0.9 % 100 mL IVPB  2 g Intravenous Q24H Mansy, Jan A, MD       chlorpheniramine-HYDROcodone  (TUSSIONEX) 10-8 MG/5ML suspension 5 mL  5 mL Oral Q12H PRN Mansy, Madison LABOR, MD       cholecalciferol  (VITAMIN D3) 25 MCG (1000 UNIT) tablet 1,000 Units  1,000 Units Oral Daily Laurita Pillion, MD   1,000 Units at 02/02/25 1426   doxycycline  (VIBRAMYCIN ) 100 mg in sodium chloride  0.9 % 250 mL IVPB  100 mg Intravenous Q12H Mansy, Jan A, MD   Stopped at 02/02/25 1116   enoxaparin  (LOVENOX ) injection 30 mg  30 mg Subcutaneous Q24H Park, Nathaniel C, RPH   30 mg at 02/02/25 1149   [START ON 02/03/2025] ferrous sulfate  tablet 325 mg  325 mg Oral Q breakfast Laurita Pillion, MD       fluticasone  furoate-vilanterol (BREO ELLIPTA ) 200-25 MCG/ACT 1 puff  1 puff Inhalation Daily Laurita Pillion, MD   1 puff at 02/02/25 1429   [START ON 02/09/2025] fosfomycin  (MONUROL ) packet 3 g  3 g Oral Weekly Laurita Pillion, MD       gabapentin  (NEURONTIN ) capsule 100 mg  100  mg Oral BID Zhang, Dekui, MD   100 mg at 02/02/25 1426   guaiFENesin  (MUCINEX ) 12 hr tablet 600 mg  600 mg Oral BID Mansy, Jan A, MD   600 mg at 02/02/25 1147   insulin  aspart (novoLOG ) injection 0-15 Units  0-15 Units Subcutaneous TID WC Mansy, Madison LABOR, MD   2 Units at 02/02/25 1427   insulin  aspart (novoLOG ) injection 0-5 Units  0-5 Units Subcutaneous QHS Mansy, Jan A, MD       ipratropium-albuterol  (DUONEB) 0.5-2.5 (3) MG/3ML nebulizer solution 3 mL  3 mL Nebulization QID Mansy, Jan A, MD   3 mL at 02/02/25 1151   ipratropium-albuterol  (DUONEB) 0.5-2.5 (3) MG/3ML nebulizer solution 3 mL  3 mL Nebulization Q4H PRN Dail Rankin RAMAN, RPH       lactated ringers  infusion   Intravenous Continuous Zhang, Dekui, MD 75 mL/hr at 02/02/25 1433 New Bag at 02/02/25 1433   magnesium  hydroxide (MILK OF MAGNESIA) suspension 30 mL  30 mL Oral Daily PRN Mansy, Jan A, MD       methylPREDNISolone  sodium succinate (SOLU-MEDROL ) 40 mg/mL  injection 40 mg  40 mg Intravenous Q24H Zhang, Dekui, MD   40 mg at 02/02/25 1307   metoprolol  tartrate (LOPRESSOR ) tablet 12.5 mg  12.5 mg Oral BID Zhang, Dekui, MD   12.5 mg at 02/02/25 1311   midodrine  (PROAMATINE ) tablet 5 mg  5 mg Oral TID WC Zhang, Dekui, MD       ondansetron  (ZOFRAN ) tablet 4 mg  4 mg Oral Q6H PRN Mansy, Jan A, MD       Or   ondansetron  (ZOFRAN ) injection 4 mg  4 mg Intravenous Q6H PRN Mansy, Madison LABOR, MD       pantoprazole  (PROTONIX ) EC tablet 40 mg  40 mg Oral BID Zhang, Dekui, MD   40 mg at 02/02/25 1311   traZODone  (DESYREL ) tablet 25 mg  25 mg Oral QHS PRN Mansy, Jan A, MD       umeclidinium bromide  (INCRUSE ELLIPTA ) 62.5 MCG/ACT 1 puff  1 puff Inhalation Daily Laurita Pillion, MD       Current Outpatient Medications  Medication Sig Dispense Refill   acetylcysteine  (MUCOMYST ) 20 % nebulizer solution Take 4 mLs by nebulization every 4 (four) hours as needed (Cough). 30 mL 12   albuterol  (VENTOLIN  HFA) 108 (90 Base) MCG/ACT inhaler Inhale 2 puffs into the lungs every 6 (six) hours as needed for wheezing or shortness of breath. 2 Inhaler 12   amiodarone  (PACERONE ) 200 MG tablet Take 200 mg by mouth daily.     cholecalciferol  (VITAMIN D3) 25 MCG (1000 UNIT) tablet Take 1,000 Units by mouth daily.     ferrous sulfate  325 (65 FE) MG tablet Take 325 mg by mouth.     Fluticasone -Salmeterol (ADVAIR DISKUS) 250-50 MCG/DOSE AEPB Inhale 1 puff into the lungs 2 (two) times daily. 1 each 12   fosfomycin  (MONUROL ) 3 g PACK Take 3 g by mouth once a week.     furosemide  (LASIX ) 20 MG tablet Alternate one pill orally every other day, 2 pills po every onther day (Patient taking differently: Take 20 mg by mouth daily. Take one tablet by mouth daily, take 2 tablets by mouth only if needed for swelling) 45 tablet 0   gabapentin  (NEURONTIN ) 800 MG tablet Take 1 tablet (800 mg total) by mouth 3 (three) times daily. 270 tablet 3   guaiFENesin -dextromethorphan  (ROBITUSSIN DM) 100-10 MG/5ML syrup  Take 5 mLs by mouth every 4 (  four) hours as needed for cough. 118 mL 0   midodrine  (PROAMATINE ) 5 MG tablet Take 5 mg by mouth 3 (three) times daily with meals.     ondansetron  (ZOFRAN -ODT) 4 MG disintegrating tablet Take 4 mg by mouth every 8 (eight) hours as needed for nausea/vomiting.  1   OXYGEN  Inhale 2 L into the lungs continuous.     pantoprazole  (PROTONIX ) 40 MG tablet Take 40 mg by mouth 2 (two) times daily.     rosuvastatin (CRESTOR) 5 MG tablet Take 5 mg by mouth every other day.     spironolactone (ALDACTONE) 25 MG tablet Take 12.5 mg by mouth daily.     Tafamidis (VYNDAMAX) 61 MG CAPS Take 61 mg by mouth daily.     tiotropium (SPIRIVA  HANDIHALER) 18 MCG inhalation capsule Place 1 capsule (18 mcg total) into inhaler and inhale daily. 30 capsule 6   torsemide (DEMADEX) 20 MG tablet Take 20 mg by mouth. Take 5 tablets (100 mg total) by mouth every morning AND 2 tablets (40 mg total) daily with lunch. Take with food.     Take 5 tablets (100 mg total) by mouth every morning AND 2 tablets (40 mg total) daily with lunch. Take with food     valACYclovir  (VALTREX ) 1000 MG tablet Take 1 tablet (1,000 mg total) by mouth daily as needed (for fever blisters). 30 tablet 12   vitamin A (A-10000) 3 MG (10000 UNITS) capsule Take 10,000 Units by mouth daily.     vutrisiran sodium (AMVUTTRA) 25 MG/0.5ML syringe Inject 25 mg into the skin every 3 (three) months.     ACCU-CHEK AVIVA PLUS test strip 1 each by Other route 2 (two) times daily. 100 each 11   ACCU-CHEK SOFTCLIX LANCETS lancets 1 each by Other route 2 (two) times daily. 100 each 11   aspirin  EC 81 MG tablet Take 81 mg by mouth daily.  (Patient not taking: Reported on 02/02/2025)     Blood Glucose Monitoring Suppl (ACCU-CHEK AVIVA PLUS) w/Device KIT USE AS DIRECTED 1 kit 12   carvedilol  (COREG ) 12.5 MG tablet Take 1 tablet (12.5 mg total) by mouth 2 (two) times daily. (Patient not taking: Reported on 02/02/2025) 60 tablet 0   clonazePAM  (KLONOPIN )  0.5 MG tablet Take 0.5 mg by mouth at bedtime as needed.  (Patient not taking: Reported on 02/02/2025)     cloNIDine  (CATAPRES ) 0.1 MG tablet 1 tablet daily as needed for systolic blood pressure (top number) greater than 180 or diastolic blood pressure (bottom number) greater than 105 (Patient not taking: Reported on 02/02/2025) 30 tablet 0   ipratropium-albuterol  (DUONEB) 0.5-2.5 (3) MG/3ML SOLN Take 3 mLs by nebulization every 6 (six) hours as needed. 360 mL 0   irbesartan  (AVAPRO ) 75 MG tablet Take 1 tablet (75 mg total) by mouth daily. 30 tablet 0   metFORMIN  (GLUCOPHAGE ) 500 MG tablet TAKE ONE TABLET BY MOUTH EVERY MORNING WITH BREAKFAST (Patient not taking: Reported on 02/02/2025) 90 tablet 3   montelukast  (SINGULAIR ) 10 MG tablet TAKE ONE TABLET BY MOUTH AT BEDTIME (Patient not taking: Reported on 02/02/2025) 30 tablet 12   nicotine  (NICODERM CQ  - DOSED IN MG/24 HOURS) 14 mg/24hr patch Place 1 patch (14 mg total) onto the skin daily. (Patient not taking: Reported on 02/02/2025) 28 patch 0   OXYCONTIN  10 MG 12 hr tablet Take 10 mg by mouth 2 (two) times daily. (Patient not taking: Reported on 02/02/2025)     polyethylene glycol (MIRALAX  / GLYCOLAX ) packet Take  17 g by mouth daily.  (Patient not taking: Reported on 02/02/2025)  1   potassium chloride  (K-DUR) 10 MEQ tablet Take 1 tablet (10 mEq total) by mouth 2 (two) times daily. (Patient not taking: Reported on 02/02/2025) 60 tablet 5   predniSONE  (STERAPRED UNI-PAK 21 TAB) 10 MG (21) TBPK tablet Taper by 10mg  until complete 21 tablet 0   sucralfate  (CARAFATE ) 1 g tablet Take 1 tablet by mouth 2 (two) times daily.         Allergies: Allergies[1]    Past Medical History: Past Medical History:  Diagnosis Date   CHF (congestive heart failure) (HCC)    COPD (chronic obstructive pulmonary disease) (HCC)    Diabetes mellitus without complication (HCC)    Diastolic heart failure (HCC)    Hyperlipidemia    Hypertension      Past Surgical History: Past  Surgical History:  Procedure Laterality Date   ABDOMINAL HYSTERECTOMY     SPINE SURGERY       Family History: Family History  Problem Relation Age of Onset   Hypertension Mother    Colon cancer Father    Hypertension Paternal Grandfather    Bladder Cancer Neg Hx    Kidney cancer Neg Hx      Social History: Social History   Socioeconomic History   Marital status: Widowed    Spouse name: Not on file   Number of children: Not on file   Years of education: Not on file   Highest education level: Not on file  Occupational History   Occupation: retired  Tobacco Use   Smoking status: Former    Current packs/day: 0.00    Average packs/day: 0.5 packs/day    Types: Cigarettes    Quit date: 04/28/2015    Years since quitting: 9.7   Smokeless tobacco: Never  Vaping Use   Vaping status: Never Used  Substance and Sexual Activity   Alcohol use: No    Alcohol/week: 0.0 standard drinks of alcohol   Drug use: No   Sexual activity: Never  Other Topics Concern   Not on file  Social History Narrative   Not on file   Social Drivers of Health   Tobacco Use: Medium Risk (02/02/2025)   Patient History    Smoking Tobacco Use: Former    Smokeless Tobacco Use: Never    Passive Exposure: Not on file  Financial Resource Strain: Low Risk (12/19/2024)   Received from [facility]   Overall Financial Resource Strain (CARDIA)    How hard is it for you to pay for the very basics like food, housing, medical care, and heating?: Not hard at all  Food Insecurity: No Food Insecurity (12/19/2024)   Received from [facility]   Epic    Within the past 12 months, you worried that your food would run out before you got the money to buy more.: Never true    Within the past 12 months, the food you bought just didn't last and you didn't have money to get more.: Never true  Transportation Needs: No Transportation Needs (12/19/2024)   Received from [facility]   PRAPARE - Transportation     Lack of Transportation (Medical): No    Lack of Transportation (Non-Medical): No  Physical Activity: Not on file  Stress: Not on file  Social Connections: Not on file  Intimate Partner Violence: Not At Risk (01/23/2025)   Received from [facility]   Epic    Within the last  year, have you been afraid of your partner or ex-partner?: No    Within the last year, have you been humiliated or emotionally abused in other ways by your partner or ex-partner?: No    Within the last year, have you been kicked, hit, slapped, or otherwise physically hurt by your partner or ex-partner?: No    Within the last year, have you been raped or forced to have any kind of sexual activity by your partner or ex-partner?: No  Depression (PHQ2-9): Not on file  Alcohol Screen: Not on file  Housing: Not on file  Utilities: Low Risk (12/19/2024)   Received from [facility]   Utilities    Within the past 12 months, have you been unable to get utilities(heat, electricity) when it was really needed?: No  Health Literacy: Medium Risk (10/16/2024)   Received from [facility] Literacy    How often do you need to have someone help you when you read instructions, pamphlets, or other written material from your doctor or pharmacy?: Sometimes     Review of Systems: Review of Systems  Constitutional:  Positive for malaise/fatigue. Negative for chills and fever.  HENT:  Negative for congestion, sore throat and tinnitus.   Eyes:  Negative for blurred vision and redness.  Respiratory:  Negative for cough, shortness of breath and wheezing.   Cardiovascular:  Negative for chest pain, palpitations, claudication and leg swelling.  Gastrointestinal:  Negative for abdominal pain, blood in stool, diarrhea, nausea and vomiting.  Genitourinary:  Negative for flank pain, frequency and hematuria.  Musculoskeletal:  Negative for back pain, falls and myalgias.  Skin:  Negative for rash.  Neurological:  Positive for  weakness. Negative for dizziness and headaches.  Endo/Heme/Allergies:  Does not bruise/bleed easily.  Psychiatric/Behavioral:  Negative for depression. The patient is not nervous/anxious and does not have insomnia.     Vital Signs: Blood pressure 109/68, pulse (!) 119, temperature (!) 97.5 F (36.4 C), temperature source Oral, resp. rate 18, height 5' 5 (1.651 m), weight 77.6 kg, SpO2 100%.  Weight trends: Filed Weights   02/02/25 0234  Weight: 77.6 kg    Physical Exam: General: NAD, ill-appearing  Head: Normocephalic, atraumatic. Moist oral mucosal membranes  Eyes: Anicteric  Lungs:  Diminished, [place] O2  Heart: Regular rate and rhythm  Abdomen:  Soft, nontender,   Extremities: Trace peripheral edema.  Neurologic: Alert and oriented  Skin: No lesions  Access: None     Lab results: Basic Metabolic Panel: Recent Labs  Lab 02/02/25 0245 02/02/25 0450  NA 136 137  K 3.9 4.1  CL 98 100  CO2 23 23  GLUCOSE 104* 107*  BUN 111* 109*  CREATININE 2.61* 2.51*  CALCIUM  9.0 8.8*    Liver Function Tests: Recent Labs  Lab 02/02/25 0245  AST 30  ALT 8  ALKPHOS 119  BILITOT 1.1  PROT 6.5  ALBUMIN 3.0*   Recent Labs  Lab 02/02/25 0245  LIPASE 54*   No results for input(s): AMMONIA in the last 168 hours.  CBC: Recent Labs  Lab 02/02/25 0245 02/02/25 0450  WBC 8.5 9.4  NEUTROABS 6.2  --   HGB 10.8* 11.7*  HCT 32.9* 35.7*  MCV 91.6 89.9  PLT 114* 115*    Cardiac Enzymes: No results for input(s): CKTOTAL, CKMB, CKMBINDEX, TROPONINI in the last 168 hours.  BNP: Invalid input(s): POCBNP  CBG: Recent Labs  Lab 02/02/25 0906 02/02/25 1340  GLUCAP 90 125*  Microbiology: Results for orders placed or performed during the hospital encounter of 02/02/25  Blood Culture (routine x 2)     Status: None (Preliminary result)   Collection Time: 02/02/25  2:57 AM   Specimen: BLOOD  Result Value Ref Range Status   Specimen Description BLOOD BLOOD  LEFT ARM  Final   Special Requests   Final    BOTTLES DRAWN AEROBIC AND ANAEROBIC Blood Culture adequate volume   Culture   Final    NO GROWTH < 12 HOURS Performed at [facility], [address], [place], [place] [address]    Report Status PENDING  Incomplete  Blood Culture (routine x 2)     Status: None (Preliminary result)   Collection Time: 02/02/25  2:57 AM   Specimen: BLOOD  Result Value Ref Range Status   Specimen Description BLOOD BLOOD RIGHT ARM  Final   Special Requests   Final    BOTTLES DRAWN AEROBIC AND ANAEROBIC Blood Culture adequate volume   Culture   Final    NO GROWTH < 12 HOURS Performed at [facility], [address], [place], [place] [address]    Report Status PENDING  Incomplete  Resp panel by RT-PCR (RSV, Flu A&B, Covid) Anterior Nasal Swab     Status: None   Collection Time: 02/02/25  2:57 AM   Specimen: Anterior Nasal Swab  Result Value Ref Range Status   SARS Coronavirus 2 by RT PCR NEGATIVE NEGATIVE Final    Comment: (NOTE) SARS-CoV-2 target nucleic acids are NOT DETECTED.  The SARS-CoV-2 RNA is generally detectable in upper respiratory specimens during the acute phase of infection. The lowest concentration of SARS-CoV-2 viral copies this assay can detect is 138 copies/mL. A negative result does not preclude SARS-Cov-2 infection and should not be used as the sole basis for treatment or other patient management decisions. A negative result may occur with  improper specimen collection/handling, submission of specimen other than nasopharyngeal swab, presence of viral mutation(s) within the areas targeted by this assay, and inadequate number of viral copies(<138 copies/mL). A negative result must be combined with clinical observations, patient history, and epidemiological information. The expected result is Negative.  Fact Sheet for Patients:  bloggercourse.com  Fact Sheet for Healthcare Providers:   seriousbroker.it  This test is no t yet approved or cleared by the [place]  FDA and  has been authorized for detection and/or diagnosis of SARS-CoV-2 by FDA under an Emergency Use Authorization (EUA). This EUA will remain  in effect (meaning this test can be used) for the duration of the COVID-19 declaration under Section 564(b)(1) of the Act, 21 U.S.C.section 360bbb-3(b)(1), unless the authorization is terminated  or revoked sooner.       Influenza A by PCR NEGATIVE NEGATIVE Final   Influenza B by PCR NEGATIVE NEGATIVE Final    Comment: (NOTE) The Xpert Xpress SARS-CoV-2/FLU/RSV plus assay is intended as an aid in the diagnosis of influenza from Nasopharyngeal swab specimens and should not be used as a sole basis for treatment. Nasal washings and aspirates are unacceptable for Xpert Xpress SARS-CoV-2/FLU/RSV testing.  Fact Sheet for Patients: bloggercourse.com  Fact Sheet for Healthcare Providers: seriousbroker.it  This test is not yet approved or cleared by the [place]  FDA and has been authorized for detection and/or diagnosis of SARS-CoV-2 by FDA under an Emergency Use Authorization (EUA). This EUA will remain in effect (meaning this test can be used) for the duration of the COVID-19 declaration under Section 564(b)(1) of the  Act, 21 U.S.C. section 360bbb-3(b)(1), unless the authorization is terminated or revoked.     Resp Syncytial Virus by PCR NEGATIVE NEGATIVE Final    Comment: (NOTE) Fact Sheet for Patients: bloggercourse.com  Fact Sheet for Healthcare Providers: seriousbroker.it  This test is not yet approved or cleared by the [place]  FDA and has been authorized for detection and/or diagnosis of SARS-CoV-2 by FDA under an Emergency Use Authorization (EUA). This EUA will remain in effect (meaning this test can be used) for  the duration of the COVID-19 declaration under Section 564(b)(1) of the Act, 21 U.S.C. section 360bbb-3(b)(1), unless the authorization is terminated or revoked.  Performed at [facility], [address] [place], [place] [address]     Coagulation Studies: Recent Labs    02/02/25 0245 02/02/25 0450  LABPROT 14.1 14.1  INR 1.0 1.0    Urinalysis: Recent Labs    02/02/25 0323  COLORURINE YELLOW*  LABSPEC 1.009  PHURINE 5.0  GLUCOSEU NEGATIVE  HGBUR MODERATE*  BILIRUBINUR NEGATIVE  KETONESUR NEGATIVE  PROTEINUR NEGATIVE  NITRITE POSITIVE*  LEUKOCYTESUR LARGE*      Imaging: US  RENAL Result Date: 02/02/2025 EXAM: RETROPERITONEAL ULTRASOUND OF THE KIDNEYS 02/02/2025 07:02:00 AM TECHNIQUE: Real-time ultrasonography of the retroperitoneum, specifically the kidneys and urinary bladder, was performed. COMPARISON: CT Abdomen Pelvis With Contrast 04/19/2018; US  Renal 04/18/2016. CLINICAL HISTORY: 84 year old female with acute kidney injury. FINDINGS: RIGHT KIDNEY: Right kidney measures 10.0 x 4.7 x 5.4 cm (estimated volume 133 ml). Normal cortical echogenicity. No hydronephrosis. No calculus. No solid right renal mass. Chronic extrarenal pelvis on the right (normal variation). LEFT KIDNEY: Left kidney measures 11.0 x 5.6 x 4.6 cm (estimated volume 124 ml). Normal cortical echogenicity. No hydronephrosis. No calculus. No solid left renal mass. BLADDER: Unremarkable appearance of the bladder. Ureteral jets are detected with doppler. IMPRESSION: 1. No acute findings. Electronically signed by: Helayne Hurst MD 02/02/2025 07:19 AM EST RP Workstation: [id number]   DG Chest Port 1 View Result Date: 02/02/2025 EXAM: 1 VIEW(S) XRAY OF THE CHEST 02/02/2025 02:56:42 AM COMPARISON: None available. CLINICAL HISTORY: Questionable sepsis; evaluate for abnormality. FINDINGS: LUNGS AND PLEURA: There are minimal patchy opacities in the right upper lobe which may be infectious/inflammatory. No  pleural effusion. No pneumothorax. HEART AND MEDIASTINUM: Mild cardiomegaly. Aortic atherosclerosis. BONES AND SOFT TISSUES: Osteopenia. IMPRESSION: 1. Minimal patchy opacities in the right upper lobe, possibly infectious or inflammatory. 2. Mild cardiomegaly and aortic atherosclerosis. 3. Osteopenia. Electronically signed by: Greig Pique MD 02/02/2025 03:05 AM EST RP Workstation: [id number]     Assessment & Plan: Ms. Megan Bolton is a 84 y.o.  female with past medical conditions including COPD, diabetes, hypertension, diastolic heart failure, dyslipidemia, and chronic kidney disease, stage IV, who was admitted to [facility] on 02/02/2025 for Sepsis due to pneumonia (HCC) [J18.9, A41.9] Pneumonia [J18.9]  Acute kidney injury on chronic kidney disease stage IV.  Baseline creatinine appears to be 2.08 with GFR 23 on 11/03/2024.  Acute kidney injury suspected due to dehydration and infectious source.  Renal ultrasound negative for acute findings.  No IV contrast exposure.  Agree with treatment of underlying cause.  Agree with IV fluids and antibiotics.  Avoid nephrotoxic agents and therapies if possible.  No acute indication for dialysis at this time.  Will continue to monitor closely.  2.  Sepsis, likely secondary to pneumonia.  Symptoms include tachycardia and mild hyperthermia.  Chest x-ray showed patchy opacities.  Currently receiving empiric antibiotics per primary team.  3.  Urinary  tract infection with positive urinalysis.  Currently receiving IV Rocephin .  Awaiting urine culture.  4. Anemia of chronic kidney disease Lab Results  Component Value Date   HGB 11.7 (L) 02/02/2025    Hemoglobin within optimal range for renal patient.  No need of ESA's at this time  5.  Hypertension with chronic kidney disease.  Currently receiving metoprolol  and midodrine .  LOS: 0 Percilla Tweten 2/6/20263:12 PM     [1]  Allergies Allergen Reactions   Nitrofurantoin  Anaphylaxis   "

## 2025-02-02 NOTE — Assessment & Plan Note (Signed)
-   This should be covered with IV Rocephin. - Will follow urine culture and sensitivity. - This is likely contributing to her sepsis.

## 2025-02-02 NOTE — Assessment & Plan Note (Addendum)
-   We will continue hydration with IV normal saline and avoid nephrotoxins. - Will follow BMP. - Nephrology consult will be obtained as well as bilateral renal ultrasound.  Dr. Douglas was notified about the patient

## 2025-02-02 NOTE — Sepsis Progress Note (Signed)
 Elink monitoring for the code sepsis protocol.

## 2025-02-02 NOTE — Assessment & Plan Note (Signed)
-   Will continue amiodarone . - She used to be on Eliquis in the past but not anymore.  She is probably not deemed to be a candidate currently for anticoagulation.

## 2025-02-02 NOTE — ED Notes (Signed)
 Rn to bedside to introduce self to pt and answer call bell. Pt was sitting on bedside toilet and needed help back to bed. Pt was a 2 person assist back to bed. Pt is HIGH fall risk pt. NO fall bundle in place currently. This RN placed fall bundle in place. Purewick placed as well. Per daughter, pt lives at home with her and they have 24 hour care at home to help aid for her.

## 2025-02-02 NOTE — Evaluation (Signed)
 Occupational Therapy Evaluation Patient Details Name: Megan Bolton MRN: [id number] DOB: January 24, 1941 Today's Date: 02/02/2025   History of Present Illness   Megan Bolton Begin is a 84 y.o. African-American female with medical history significant for diastolic CHF, COPD, on home O2 at 3 L p.m. type diabetes mellitus, hypertension and dyslipidemia, who presented to the emergency room with acute onset of excessive fatigue and generalized weakness. UA was positive for UTI. Chest xray showed Minimal patchy opacities in the right upper lobe, possibly infectious or  inflammatory.     Clinical Impressions Patient was seen for OT evaluation this date. Prior to hospital admission, patient was having 24/7 supervision/assist for all ADLs, sedentary at baseline and ambulates short distances with rollator. Caregiver/family assist with all ADLs and patient only toilets on BSC. Patient lives daughter and SIL.  Patient performed bed mobility with mod A, SPT from EOB to [facility] with mod A. HR monitored throughout, did not exceed 120. Daughter present and stating that patient is close to her baseline however patient endorses feeling weaker. Patient presents with deficits in standing balance/tolerance and overall activity tolerance, affecting safe and optimal ADL completion. Patient is currently requiring max A for toileting and mod A for SPT. Patient ended session on [facility] with daughter present, daughter agreeable to call RN when patient is ready to get up. OT also sent secure chat to RN to notify. Patient would benefit from skilled OT services to address noted impairments and functional limitations (see below for any additional details) in order to maximize safety and independence while minimizing future risk of falls, injury, and readmission. Anticipate the need for follow up OT services upon acute hospital DC.      If plan is discharge home, recommend the following:   A little help with walking and/or transfers;A little  help with bathing/dressing/bathroom;Assistance with cooking/housework;Direct supervision/assist for medications management;Help with stairs or ramp for entrance     Functional Status Assessment   Patient has had a recent decline in their functional status and demonstrates the ability to make significant improvements in function in a reasonable and predictable amount of time.     Equipment Recommendations   None recommended by OT     Recommendations for Other Services         Precautions/Restrictions   Precautions Precautions: Fall Recall of Precautions/Restrictions: Intact Restrictions Weight Bearing Restrictions Per Provider Order: No     Mobility Bed Mobility Overal bed mobility: Needs Assistance Bed Mobility: Supine to Sit     Supine to sit: Mod assist     General bed mobility comments: mod A to lift trunk    Transfers Overall transfer level: Needs assistance   Transfers: Bed to chair/wheelchair/BSC       Step pivot transfers: Mod assist     General transfer comment: used r/w, mod A for standing, mod A to transition to [facility] by A with weight shifting      Balance Overall balance assessment: Mild deficits observed, not formally tested                                         ADL either performed or assessed with clinical judgement   ADL Overall ADL's : At baseline  General ADL Comments: receives max A for ADLs at baseline     Vision         Perception         Praxis         Pertinent Vitals/Pain Pain Assessment Pain Assessment: No/denies pain     Extremity/Trunk Assessment Upper Extremity Assessment Upper Extremity Assessment: Generalized weakness   Lower Extremity Assessment Lower Extremity Assessment: Defer to PT evaluation       Communication Communication Communication: No apparent difficulties   Cognition Arousal: Alert Behavior During Therapy:  WFL for tasks assessed/performed Cognition: No apparent impairments                               Following commands: Intact       Cueing  General Comments   Cueing Techniques: Verbal cues  on 3L of O2, which is her baseline   Exercises     Shoulder Instructions      Home Living Family/patient expects to be discharged to:: Private residence Living Arrangements: Children Available Help at Discharge: Family;Personal care attendant;Available 24 hours/day Type of Home: House Home Access: Ramped entrance     Home Layout: One level     Bathroom Shower/Tub: Sponge bathes at baseline         Home Equipment: Rollator (4 wheels);BSC/3in1          Prior Functioning/Environment Prior Level of Function : Needs assist       Physical Assist : Mobility (physical);ADLs (physical) Mobility (physical): Transfers;Gait ADLs (physical): Bathing;Dressing;Toileting;IADLs Mobility Comments: uses a rollator short distance mobility, w/c for out of house access ADLs Comments: aid assists with all parts of ADLs    OT Problem List: Decreased strength;Decreased activity tolerance;Impaired balance (sitting and/or standing)   OT Treatment/Interventions: Self-care/ADL training;Therapeutic exercise;Energy conservation;DME and/or AE instruction;Therapeutic activities;Patient/family education;Balance training      OT Goals(Current goals can be found in the care plan section)   Acute Rehab OT Goals Patient Stated Goal: to go home OT Goal Formulation: With patient Time For Goal Achievement: 02/16/25 Potential to Achieve Goals: Good ADL Goals Pt Will Perform Grooming: with set-up;sitting Pt Will Perform Lower Body Dressing: with min assist;sit to/from stand Pt Will Transfer to Toilet: with supervision;bedside commode;stand pivot transfer;ambulating Pt Will Perform Toileting - Clothing Manipulation and hygiene: sit to/from stand;with contact guard assist;with min assist    OT Frequency:  Min 2X/week    Co-evaluation              AM-PAC OT 6 Clicks Daily Activity     Outcome Measure Help from another person eating meals?: None Help from another person taking care of personal grooming?: A Little Help from another person toileting, which includes using toliet, bedpan, or urinal?: A Lot Help from another person bathing (including washing, rinsing, drying)?: A Lot Help from another person to put on and taking off regular upper body clothing?: A Little Help from another person to put on and taking off regular lower body clothing?: A Lot 6 Click Score: 16   End of Session Equipment Utilized During Treatment: Rolling walker (2 wheels);Oxygen  Nurse Communication: Mobility status  Activity Tolerance: Patient limited by fatigue Patient left: Other (comment);with call bell/phone within reach;with family/visitor present (on [facility])  OT Visit Diagnosis: Unsteadiness on feet (R26.81);Other abnormalities of gait and mobility (R26.89)                Time: 8453-8388 OT Time Calculation (min):  25 min Charges:  OT General Charges $OT Visit: 1 Visit OT Evaluation $OT Eval Low Complexity: 1 Low OT Treatments $Self Care/Home Management : 8-22 mins  Rogers Clause, OT/L MSOT, 02/02/2025

## 2025-02-02 NOTE — Progress Notes (Signed)
 PHARMACIST - PHYSICIAN COMMUNICATION  CONCERNING:  Enoxaparin  (Lovenox ) for DVT Prophylaxis    RECOMMENDATION: Patient was prescribed enoxaprin 40mg  q24 hours for VTE prophylaxis.   Filed Weights   02/02/25 0234  Weight: 77.6 kg (171 lb 1.2 oz)    Body mass index is 28.47 kg/m.  Estimated Creatinine Clearance: 16.8 mL/min (A) (by C-G formula based on SCr of 2.61 mg/dL (H)).  Patient is candidate for enoxaparin  30mg  every 24 hours based on CrCl <9ml/min or Weight <45kg  DESCRIPTION: Pharmacy has adjusted enoxaparin  dose per [facility] policy.  Patient is now receiving enoxaparin  30 mg every 24 hours   Rankin CANDIE Dills, PharmD, [facility] 02/02/2025 4:32 AM

## 2025-02-02 NOTE — Assessment & Plan Note (Addendum)
-   Will continue antihypertensive therapy while holding off nephrotoxins when medications are reconciled and the blood pressure is improved as it is currently on the softer side.

## 2025-02-02 NOTE — Progress Notes (Signed)
 SLP Cancellation Note  Patient Details Name: Megan Bolton MRN: [id number] DOB: 11/30/1941   Cancelled treatment:       Reason Eval/Treat Not Completed: Other (comment) Consult recieved and chart reviewed:  Pt recently hospitalized at [facility] (12/2024) for pneumonia    Pt was just seen at [facility] on 01/26/2025 with the speech therapist Coughing was noted following sequential cup sip. SLP provided education regarding FEES to visualize swallow function and determine presence of aspiration. Patient declined FEES at this time, stating she would be interested in further testing when she is feeling better. Discussed risks of aspiration, patient and her daugher opted to defer evaluation at this time. Recommend continuation of regular diet, thin liquids and medications whole with thin liquids. SLP to follow up to assess diet tolerance 1-2x. Additionally, recommend consideration of MBSS in OP setting to assess for oropharyngeal dysphagia  Given the fact that she has be re-hospitalized with pneumonia, I feel that a Modified Barium Swallow is indicated. I spoke with pt's daughter, provided education on my recommendation and well as details regarding Modified Barium Swallow Study. They declined. MD made aware. We will sign off.   Verla Bryngelson B. Rubbie, M.S., CCC-SLP, CBIS Speech-Language Pathologist Certified Brain Injury Specialist [facility]  [facility] [phone] Ascom [phone] Fax [phone]   Jex Strausbaugh Rubbie 02/02/2025, 2:28 PM

## 2025-02-02 NOTE — H&P (Signed)
 "     [place]   PATIENT NAME: Megan Bolton    MR#:  [id number]  DATE OF BIRTH:  10/26/1941  DATE OF ADMISSION:  02/02/2025  PRIMARY CARE PHYSICIAN: Myrna Kay, MD   Patient is coming from: Home  REQUESTING/REFERRING PHYSICIAN: Gordan Huxley, MD  CHIEF COMPLAINT:   Chief Complaint  Patient presents with   Weakness    84 y/o F, BIBA, from home w/ c/o increasing weakness at home.  EMS reported pt was orthostatic for them.  Pt received a 500ml bolus of Normal Saline from EMS.    HISTORY OF PRESENT ILLNESS:  Megan Bolton is a 84 y.o. African-American female with medical history significant for diastolic CHF, COPD, on home O2 at 3 L p.m. type diabetes mellitus, hypertension and dyslipidemia, who presented to the emergency room with acute onset of excessive fatigue and generalized weakness.  She admitted to dyspnea with associated cough occasionally productive of clear sputum as well as occasional wheezing.  She denied any fever or chills.  She has been having urinary frequency and urgency without dysuria, oliguria or hematuria or flank pain.  No fever or chills.  No chest pain or palpitations.  ED Course: When the patient came to the ER, heart rate was 104 and temperature 97.5 and pulse oximetry was 94% on 2 L of O2 by nasal cannula.  CMP revealed a creatinine of 2.61 and BUN of 111.  Albumin was 3 and lipase 54.  High-sensitivity troponin T was 150 lactic acid was 3.6 then 2.8.  CBC showed hemoglobin of 10.8 and hematocrit 32.9 lower than previous levels with platelets of 114 lower than previous levels.  PT and INR were normal.  UA was positive for UTI. EKG as reviewed by me : EKG showed atrial fibrillation with RVR 109 with Q waves septally and IVCD. Imaging: Portable chest x-ray showed the following: 1. Minimal patchy opacities in the right upper lobe, possibly infectious or inflammatory. 2. Mild cardiomegaly and aortic atherosclerosis. 3. Osteopenia.  The patient was given  500 mL IV normal saline bolus, IV cefepime  and vancomycin .  She will be admitted to the progressive unit bed for further evaluation and management. PAST MEDICAL HISTORY:   Past Medical History:  Diagnosis Date   CHF (congestive heart failure) (HCC)    COPD (chronic obstructive pulmonary disease) (HCC)    Diabetes mellitus without complication (HCC)    Diastolic heart failure (HCC)    Hyperlipidemia    Hypertension     PAST SURGICAL HISTORY:   Past Surgical History:  Procedure Laterality Date   ABDOMINAL HYSTERECTOMY     SPINE SURGERY      SOCIAL HISTORY:   Social History   Tobacco Use   Smoking status: Former    Current packs/day: 0.00    Average packs/day: 0.5 packs/day    Types: Cigarettes    Quit date: 04/28/2015    Years since quitting: 9.7   Smokeless tobacco: Never  Substance Use Topics   Alcohol use: No    Alcohol/week: 0.0 standard drinks of alcohol    FAMILY HISTORY:   Family History  Problem Relation Age of Onset   Hypertension Mother    Colon cancer Father    Hypertension Paternal Grandfather    Bladder Cancer Neg Hx    Kidney cancer Neg Hx     DRUG ALLERGIES:  Allergies[1]  REVIEW OF SYSTEMS:   ROS As per history of present illness. All pertinent systems were reviewed above. Constitutional, HEENT,  cardiovascular, respiratory, GI, GU, musculoskeletal, neuro, psychiatric, endocrine, integumentary and hematologic systems were reviewed and are otherwise negative/unremarkable except for positive findings mentioned above in the HPI.   MEDICATIONS AT HOME:   Prior to Admission medications  Medication Sig Start Date End Date Taking? Authorizing Provider  ACCU-CHEK AVIVA PLUS test strip 1 each by Other route 2 (two) times daily. 09/08/17   Karamalegos, Marsa PARAS, DO  ACCU-CHEK SOFTCLIX LANCETS lancets 1 each by Other route 2 (two) times daily. 09/08/17   Karamalegos, Marsa PARAS, DO  acetylcysteine  (MUCOMYST ) 20 % nebulizer solution Take 4 mLs by  nebulization every 4 (four) hours as needed (Cough). 12/19/18   Floy Roberts, MD  albuterol  (VENTOLIN  HFA) 108 [phone] Base) MCG/ACT inhaler Inhale 2 puffs into the lungs every 6 (six) hours as needed for wheezing or shortness of breath. 05/12/17   Karamalegos, Marsa PARAS, DO  aspirin  EC 81 MG tablet Take 81 mg by mouth daily.     [provider]  Blood Glucose Monitoring Suppl (ACCU-CHEK AVIVA PLUS) w/Device KIT USE AS DIRECTED 01/12/17   Vonzell Lynwood VEAR Mickey., MD  carvedilol  (COREG ) 12.5 MG tablet Take 1 tablet (12.5 mg total) by mouth 2 (two) times daily. Patient taking differently: Take 12.5 mg by mouth daily.  09/28/18   Vachhani, Vaibhavkumar, MD  clonazePAM  (KLONOPIN ) 0.5 MG tablet Take 0.5 mg by mouth at bedtime as needed.  07/04/15   [provider]  cloNIDine  (CATAPRES ) 0.1 MG tablet 1 tablet daily as needed for systolic blood pressure (top number) greater than 180 or diastolic blood pressure (bottom number) greater than 105 11/09/18   Wieting, Richard, MD  Fluticasone -Salmeterol (ADVAIR DISKUS) 250-50 MCG/DOSE AEPB Inhale 1 puff into the lungs 2 (two) times daily. 05/04/17   Karamalegos, Marsa PARAS, DO  furosemide  (LASIX ) 20 MG tablet Alternate one pill orally every other day, 2 pills po every onther day Patient taking differently: Take 20 mg by mouth daily. Take one tablet by mouth daily, take 2 tablets by mouth only if needed for swelling 11/09/18   Josette Ade, MD  gabapentin  (NEURONTIN ) 800 MG tablet Take 1 tablet (800 mg total) by mouth 3 (three) times daily. 12/01/16   Vonzell Lynwood VEAR Mickey., MD  guaiFENesin -dextromethorphan  (ROBITUSSIN DM) 100-10 MG/5ML syrup Take 5 mLs by mouth every 4 (four) hours as needed for cough. 12/11/18   Tobie Press, MD  ipratropium-albuterol  (DUONEB) 0.5-2.5 (3) MG/3ML SOLN Take 3 mLs by nebulization every 6 (six) hours as needed. 12/11/18 01/10/19  Tobie Press, MD  irbesartan  (AVAPRO ) 75 MG tablet Take 1 tablet (75 mg total) by mouth  daily. 11/09/18 11/09/19  Josette Ade, MD  metFORMIN  (GLUCOPHAGE ) 500 MG tablet TAKE ONE TABLET BY MOUTH EVERY MORNING WITH BREAKFAST Patient taking differently: Take 500 mg by mouth daily.  03/24/17   Vonzell Lynwood VEAR Mickey., MD  montelukast  (SINGULAIR ) 10 MG tablet TAKE ONE TABLET BY MOUTH AT BEDTIME 03/24/17   Vonzell Lynwood VEAR Mickey., MD  nicotine  (NICODERM CQ  - DOSED IN MG/24 HOURS) 14 mg/24hr patch Place 1 patch (14 mg total) onto the skin daily. 12/12/18   Tobie Press, MD  ondansetron  (ZOFRAN -ODT) 4 MG disintegrating tablet Take 4 mg by mouth every 8 (eight) hours as needed for nausea/vomiting. 08/18/18   [provider]  OXYCONTIN  10 MG 12 hr tablet Take 10 mg by mouth 2 (two) times daily.    [provider]  OXYGEN  Inhale 2 L into the lungs continuous.    [provider]  polyethylene glycol (MIRALAX  / GLYCOLAX ) packet Take 17 g by mouth daily.  05/10/18   [provider]  potassium chloride  (K-DUR) 10 MEQ tablet Take 1 tablet (10 mEq total) by mouth 2 (two) times daily. 10/12/17   Karamalegos, Marsa PARAS, DO  predniSONE  (STERAPRED UNI-PAK 21 TAB) 10 MG (21) TBPK tablet Taper by 10mg  until complete 12/11/18   Tobie Press, MD  sucralfate  (CARAFATE ) 1 g tablet Take 1 tablet by mouth 2 (two) times daily.  02/15/18 02/15/19  [provider]  tiotropium (SPIRIVA  HANDIHALER) 18 MCG inhalation capsule Place 1 capsule (18 mcg total) into inhaler and inhale daily. 08/27/17   Karamalegos, Marsa PARAS, DO  valACYclovir  (VALTREX ) 1000 MG tablet Take 1 tablet (1,000 mg total) by mouth daily as needed (for fever blisters). 04/19/16   Sherial Bail, MD      VITAL SIGNS:  Blood pressure 113/61, pulse (!) 104, temperature (!) 97.5 F (36.4 C), temperature source Oral, height 5' 5 (1.651 m), weight 77.6 kg, SpO2 94%.  PHYSICAL EXAMINATION:  Physical Exam  GENERAL:  84 y.o.-year-old African-American female patient lying in the bed with mild respiratory  distress with conversational dyspnea.   EYES: Pupils equal, round, reactive to light and accommodation. No scleral icterus. Extraocular muscles intact.  HEENT: Head atraumatic, normocephalic. Oropharynx and nasopharynx clear.  NECK:  Supple, no jugular venous distention. No thyroid  enlargement, no tenderness.  LUNGS: Diminished bibasilar breath sounds as well as right upper lung zone breath sounds.  No use of accessory muscles of respiration.  CARDIOVASCULAR: Regular rate and rhythm, S1, S2 normal. No murmurs, rubs, or gallops.  ABDOMEN: Soft, nondistended, nontender. Bowel sounds present. No organomegaly or mass.  EXTREMITIES: No pedal edema, cyanosis, or clubbing.  NEUROLOGIC: Cranial nerves II through XII are intact. Muscle strength 5/5 in all extremities. Sensation intact. Gait not checked.  PSYCHIATRIC: The patient is alert and oriented x 3.  Normal affect and good eye contact. SKIN: No obvious rash, lesion, or ulcer.   LABORATORY PANEL:   CBC Recent Labs  Lab 02/02/25 0450  WBC 9.4  HGB 11.7*  HCT 35.7*  PLT 115*   ------------------------------------------------------------------------------------------------------------------  Chemistries  Recent Labs  Lab 02/02/25 0245 02/02/25 0450  NA 136 137  K 3.9 4.1  CL 98 100  CO2 23 23  GLUCOSE 104* 107*  BUN 111* 109*  CREATININE 2.61* 2.51*  CALCIUM  9.0 8.8*  AST 30  --   ALT 8  --   ALKPHOS 119  --   BILITOT 1.1  --    ------------------------------------------------------------------------------------------------------------------  Cardiac Enzymes No results for input(s): TROPONINI in the last 168 hours. ------------------------------------------------------------------------------------------------------------------  RADIOLOGY:  DG Chest Port 1 View Result Date: 02/02/2025 EXAM: 1 VIEW(S) XRAY OF THE CHEST 02/02/2025 02:56:42 AM COMPARISON: None available. CLINICAL HISTORY: Questionable sepsis; evaluate for  abnormality. FINDINGS: LUNGS AND PLEURA: There are minimal patchy opacities in the right upper lobe which may be infectious/inflammatory. No pleural effusion. No pneumothorax. HEART AND MEDIASTINUM: Mild cardiomegaly. Aortic atherosclerosis. BONES AND SOFT TISSUES: Osteopenia. IMPRESSION: 1. Minimal patchy opacities in the right upper lobe, possibly infectious or inflammatory. 2. Mild cardiomegaly and aortic atherosclerosis. 3. Osteopenia. Electronically signed by: Greig Pique MD 02/02/2025 03:05 AM EST RP Workstation: [id number]      IMPRESSION AND PLAN:  Assessment and Plan: * Sepsis due to pneumonia [facility]) - The patient will be admitted to a medical telemetry bed. - Will continue antibiotic therapy with IV Rocephin  and doxycycline . - Mucolytic therapy be provided as well  as duo nebs q.i.d. and q.4 hours p.r.n. - We will follow blood cultures. - Sepsis manifested by tachycardia and mild hypothermia.  RR was 20. -The patient will be hydrated with IV normal saline. - Cautious hydration is provided given her diastolic heart failure.  Acute lower UTI - This should be covered with IV Rocephin . - Will follow urine culture and sensitivity. - This is likely contributing to her sepsis.  Paroxysmal atrial fibrillation with RVR (HCC) - Will continue amiodarone . - She used to be on Eliquis in the past but not anymore.  She is probably not deemed to be a candidate currently for anticoagulation.  AKI (acute kidney injury) - We will continue hydration with IV normal saline and avoid nephrotoxins. - Will follow BMP. - Nephrology consult will be obtained as well as bilateral renal ultrasound.  Dr. Douglas was notified about the patient  Essential hypertension - Will continue antihypertensive therapy while holding off nephrotoxins when medications are reconciled and the blood pressure is improved as it is currently on the softer side.  Type 2 diabetes mellitus with peripheral neuropathy (HCC) - The  patient be placed on supplemental coverage with NovoLog . - Will continue Neurontin . - Will hold off metformin .    DVT prophylaxis: Lovenox . Advanced Care Planning:  Code Status: full code. Family Communication:  The plan of care was discussed in details with the patient (and family). I answered all questions. The patient agreed to proceed with the above mentioned plan. Further management will depend upon hospital course. Disposition Plan: Back to previous home environment Consults called: Nephrology All the records are reviewed and case discussed with ED provider.  Status is: Inpatient  At the time of the admission, it appears that the appropriate admission status for this patient is inpatient.  This is judged to be reasonable and necessary in order to provide the required intensity of service to ensure the patient's safety given the presenting symptoms, physical exam findings and initial radiographic and laboratory data in the context of comorbid conditions.  The patient requires inpatient status due to high intensity of service, high risk of further deterioration and high frequency of surveillance required.  I certify that at the time of admission, it is my clinical judgment that the patient will require inpatient hospital care extending more than 2 midnights.                            Dispo: The patient is from: Home              Anticipated d/c is to: Home              Patient currently is not medically stable to d/c.              Difficult to place patient: No  [place] DELENA Peaches M.D on 02/02/2025 at 6:07 AM  [facility]   From 7 PM-7 AM, contact night-coverage [email or web address]  CC: Primary care physician; Myrna Kay, MD     [1]  Allergies Allergen Reactions   Nitrofurantoin  Anaphylaxis   "

## 2025-02-02 NOTE — Progress Notes (Signed)
 CODE SEPSIS - PHARMACY COMMUNICATION  **Broad Spectrum Antibiotics should be administered within 1 hour of Sepsis diagnosis**  Time Code Sepsis Called/Page Received: 9671  Antibiotics Ordered: Pt initially ordered Ceftriaxone  and Azithromycin , then transitioned to Cefepime  and Vancomycin   Time of 1st antibiotic administration: 0406  Rankin CANDIE Dills, PharmD, [facility] 02/02/2025 3:54 AM

## 2025-02-02 NOTE — ED Provider Notes (Signed)
 "  [facility] Provider Note    Event Date/Time   First MD Initiated Contact with Patient 02/02/25 [phone]     (approximate)   History   Weakness (84 y/o F, BIBA, from home w/ c/o increasing weakness at home.  EMS reported pt was orthostatic for them.  Pt received a 500ml bolus of Normal Saline from EMS.)   HPI Megan Bolton is a 84 y.o. female with a complicated medical history that includes recent episodes of pulmonary illness, cardiac amyloidosis, heart failure with preserved ejection fraction, etc.  She was discharged about a week ago from [facility]  She presents tonight by EMS for generalized weakness at home.  She received a 500 mg bolus of normal saline by EMS prior to arrival and feels a little bit better now.  Her breathing is a little bit worse than usual but it is tough for her to tell because she has COPD on 3 L of oxygen  at baseline.  She has been on antibiotics recently for possible pneumonia.  She is having increased urinary frequency but no pain and specifically denies chest pain, abdominal pain, and dysuria.  Family is with her at bedside.     Physical Exam   Triage Vital Signs: ED Triage Vitals  Encounter Vitals Group     BP 02/02/25 0235 113/61     Girls Systolic BP Percentile --      Girls Diastolic BP Percentile --      Boys Systolic BP Percentile --      Boys Diastolic BP Percentile --      Pulse Rate 02/02/25 0235 (!) 104     Resp --      Temp 02/02/25 0235 (!) 97.5 F (36.4 C)     Temp Source 02/02/25 0235 Oral     SpO2 02/02/25 0235 94 %     Weight 02/02/25 0234 77.6 kg (171 lb 1.2 oz)     Height 02/02/25 0234 1.651 m (5' 5)     Head Circumference --      Peak Flow --      Pain Score --      Pain Loc --      Pain Education --      Exclude from Growth Chart --     Most recent vital signs: Vitals:   02/02/25 0235  BP: 113/61  Pulse: (!) 104  Temp: (!) 97.5 F (36.4 C)  SpO2: 94%    General: Awake, alert, pleasant  and conversant.  Appears chronically ill but is nontoxic appearance at this time. CV:  Good peripheral perfusion.  Mild tachycardia, regular rhythm. Resp:  Normal effort. Speaking easily and comfortably, no accessory muscle usage nor intercostal retractions.  No wheezing, rales, nor rhonchi. Abd:  Obese.  Soft, nondistended, nontender to palpation.   ED Results / Procedures / Treatments   Labs (all labs ordered are listed, but only abnormal results are displayed) Labs Reviewed  LACTIC ACID, PLASMA - Abnormal; Notable for the following components:      Result Value   Lactic Acid, Venous 3.6 (*)    All other components within normal limits  COMPREHENSIVE METABOLIC PANEL WITH GFR - Abnormal; Notable for the following components:   Glucose, Bld 104 (*)    BUN 111 (*)    Creatinine, Ser 2.61 (*)    Albumin 3.0 (*)    GFR, Estimated 18 (*)    All other components within normal limits  CBC WITH DIFFERENTIAL/PLATELET -  Abnormal; Notable for the following components:   RBC 3.59 (*)    Hemoglobin 10.8 (*)    HCT 32.9 (*)    Platelets 114 (*)    nRBC 2.2 (*)    Monocytes Absolute 1.1 (*)    All other components within normal limits  LIPASE, BLOOD - Abnormal; Notable for the following components:   Lipase 54 (*)    All other components within normal limits  URINALYSIS, W/ REFLEX TO CULTURE (INFECTION SUSPECTED) - Abnormal; Notable for the following components:   Color, Urine YELLOW (*)    APPearance CLOUDY (*)    Hgb urine dipstick MODERATE (*)    Nitrite POSITIVE (*)    Leukocytes,Ua LARGE (*)    Bacteria, UA FEW (*)    All other components within normal limits  TROPONIN T, HIGH SENSITIVITY - Abnormal; Notable for the following components:   Troponin T High Sensitivity 150 (*)    All other components within normal limits  RESP PANEL BY RT-PCR (RSV, FLU A&B, COVID)  RVPGX2  CULTURE, BLOOD (ROUTINE X 2)  CULTURE, BLOOD (ROUTINE X 2)  URINE CULTURE  PROTIME-INR  LACTIC ACID, PLASMA   PRO BRAIN NATRIURETIC PEPTIDE  PROTIME-INR  CORTISOL-AM, BLOOD  BASIC METABOLIC PANEL WITH GFR  CBC  TROPONIN T, HIGH SENSITIVITY     EKG  ED ECG REPORT I, Darleene Dome, the attending physician, personally viewed and interpreted this ECG.  Date: 02/02/2025 EKG Time: 2:40 AM Rate: 109 Rhythm: A-fib/flutter versus sinus rhythm, difficult to appreciate P waves QRS Axis: normal Intervals: Prolonged QTc at 535 ms ST/T Wave abnormalities: Non-specific ST segment / T-wave changes, but no clear evidence of acute ischemia. Narrative Interpretation: no definitive evidence of acute ischemia; does not meet STEMI criteria.    RADIOLOGY I independently viewed and interpreted the patient's chest x-ray and there is some opacity in the right upper lobe that could represent pneumonia.  Confirmed by radiology.   PROCEDURES:  Critical Care performed: Yes, see critical care procedure note(s)  .1-3 Lead EKG Interpretation  Performed by: Dome Darleene, MD Authorized by: Dome Darleene, MD     Interpretation: abnormal     ECG rate:  105   ECG rate assessment: tachycardic     Rhythm: sinus tachycardia     Ectopy: none     Conduction: normal   .Critical Care  Performed by: Dome Darleene, MD Authorized by: Dome Darleene, MD   Critical care provider statement:    Critical care time (minutes):  30   Critical care time was exclusive of:  Separately billable procedures and treating other patients   Critical care was necessary to treat or prevent imminent or life-threatening deterioration of the following conditions:  Sepsis   Critical care was time spent personally by me on the following activities:  Development of treatment plan with patient or surrogate, evaluation of patient's response to treatment, examination of patient, obtaining history from patient or surrogate, ordering and performing treatments and interventions, ordering and review of laboratory studies, ordering and review of  radiographic studies, pulse oximetry, re-evaluation of patient's condition and review of old charts     IMPRESSION / MDM / ASSESSMENT AND PLAN / ED COURSE  I reviewed the triage vital signs and the nursing notes.                              Differential diagnosis includes, but is not limited to, healthcare associated pneumonia, viral  illness, volume overload.  Patient's presentation is most consistent with acute presentation with potential threat to life or bodily function.  Labs/studies ordered (see ED course for additional labs and studies that may have been added later): I ordered standard sepsis labs/studies including the following: respiratory viral panel PCR swab, blood cultures x2, pro time-INR, CMP, urinalysis, urine culture, lactic acid, CBC with differential, high-sensitivity troponin, lipase, 1-view chest x-ray, EKG.  Interventions/Medications given:  Medications  lactated ringers  bolus 1,000 mL (has no administration in time range)    And  lactated ringers  bolus 500 mL (has no administration in time range)  ceFEPIme  (MAXIPIME ) 2 g in sodium chloride  0.9 % 100 mL IVPB (2 g Intravenous New Bag/Given 02/02/25 0406)  vancomycin  (VANCOREADY) IVPB 1750 mg/350 mL (has no administration in time range)  aspirin  EC tablet 81 mg (has no administration in time range)  enoxaparin  (LOVENOX ) injection 40 mg (has no administration in time range)  lactated ringers  infusion (has no administration in time range)  cefTRIAXone  (ROCEPHIN ) 2 g in sodium chloride  0.9 % 100 mL IVPB (has no administration in time range)  doxycycline  (VIBRAMYCIN ) 100 mg in sodium chloride  0.9 % 250 mL IVPB (has no administration in time range)  acetaminophen  (TYLENOL ) tablet 650 mg (has no administration in time range)    Or  acetaminophen  (TYLENOL ) suppository 650 mg (has no administration in time range)  traZODone  (DESYREL ) tablet 25 mg (has no administration in time range)  ondansetron  (ZOFRAN ) tablet 4 mg (has  no administration in time range)    Or  ondansetron  (ZOFRAN ) injection 4 mg (has no administration in time range)  magnesium  hydroxide (MILK OF MAGNESIA) suspension 30 mL (has no administration in time range)  sodium chloride  0.9 % bolus 500 mL (0 mLs Intravenous Stopped 02/02/25 0400)    (Note:  hospital course my include additional interventions and/or labs/studies not listed above.)   Patient appears chronically ill but is awake and alert and pleasant at this time and does not appear toxic.  Chest x-ray is consistent with pneumonia and she meets sepsis criteria even though she does not have a leukocytosis based on some mild tachypnea, tachycardia, and a source of infection.  Additionally, her lactic acid is 3.6 worrisome for severe sepsis.  I am ordering empiric antibiotics of cefepime  2 g IV and vancomycin  given that she was just recently in the hospital.  I talked with her family who is worried about volume overload but I explained that we would be gentle with fluids but that her blood pressure is a bit on the low side and that given that she may have severe sepsis we need to make sure and adequately fluid resuscitate her but we will be doing so cautiously.  The patient and her family understand and agree.  Given that she is followed at [facility], we talked about whether she should be transferred to [facility].  However, given that this issue seems to be 1 of infection rather than a specialty issue requiring a tertiary care center (like her chronic cardiac amyloidosis), the patient would prefer to stay local and I think that is reasonable.  High-sensitivity troponin is slightly elevated but I suspect this is due to demand ischemia in the setting of severe sepsis given that she is not having any chest pain  The patient is on the cardiac monitor to evaluate for evidence of arrhythmia and/or significant heart rate changes.   Clinical Course as of 02/02/25 0429  Fri Feb 02, 2025  0327 Lactic  Acid,  Venous(!!): 3.6 [CF]  0345 I am consulting the hospitalist team for admission.   [CF]  0422 I consulted by phone with the admitting hospitalist, and they will admit the patient - Dr. Lawence [CF]    Clinical Course User Index [CF] Gordan Huxley, MD     FINAL CLINICAL IMPRESSION(S) / ED DIAGNOSES   Final diagnoses:  Community acquired pneumonia, unspecified laterality  Severe sepsis (HCC)  Demand ischemia (HCC)  Elevated troponin level  Chronic kidney disease, unspecified [name] stage  Generalized weakness     Rx / DC Orders   ED Discharge Orders     None        Note:  This document was prepared using Dragon voice recognition software and may include unintentional dictation errors.   Gordan Huxley, MD 02/02/25 [phone]  "

## 2025-02-02 NOTE — ED Notes (Signed)
 Report given to Zack, CHARITY FUNDRAISER.  Pt transferred to room ED 13, via ED stretcher, accompanied by ER Tech and pts daughter.  Nurse sign off.

## 2025-02-02 NOTE — Progress Notes (Signed)
 " Progress Note   Patient: Megan Bolton FMW:[id number] DOB: 1941-01-29 DOA: 02/02/2025     0 DOS: the patient was seen and examined on 02/02/2025   Brief hospital course: Megan Bolton is a 84 y.o. African-American female with medical history significant for diastolic CHF, COPD, on home O2 at 3 L p.m. type diabetes mellitus, hypertension and dyslipidemia, who presented to the emergency room with acute onset of excessive fatigue and generalized weakness.  She admitted to dyspnea with associated cough occasionally productive of clear sputum as well as occasional wheezing.  Upon arrival to emergency room, patient had tachycardia 105, no leukocytosis.  Chest x-ray showed right upper lobe airspace disease.  Patient was started on Rocephin  and doxycycline  for pneumonia.   Principal Problem:   Sepsis due to pneumonia [facility]) Active Problems:   Acute lower UTI   Paroxysmal atrial fibrillation with RVR (HCC)   AKI (acute kidney injury)   [name] (chronic kidney disease) stage 4, GFR 15-29 ml/min (HCC)   Type 2 diabetes mellitus with peripheral neuropathy (HCC)   Essential hypertension   Assessment and Plan: Right upper lobe pneumonia. Acute urinary tract infection. Sepsis ruled out. Reviewed labs and vital signs, patient only has tachycardia with atrial fibrillation, does not meet sepsis criteria. Patient does have respiratory symptoms,  chest x-ray did show right upper lobe pneumonia.  Will continue antibiotics. Will obtain speech therapy to rule out aspiration pneumonia.  COPD exacerbation. Chronic respiratory failure with hypoxemia and hypercapnia. Patient was chronically on 3 L oxygen , also on trilogy at nighttime.  Will resume trilogy per RT.  I also started some IV steroids.  Lactic acidosis. Patient has metformin  in the record, but does not seem to have taken it.  Patient does not meet sepsis criteria, lactic acid could be due to dehydration from poor p.o. intake.  Nephrology has started  IV fluids, will continue.  Paroxysmal atrial fibrillation with RVR (HCC) Chronic hypotension. - Will continue amiodarone . Still has some tachycardia, added lower dose metoprolol . Patient also taking midodrine  chronically for hypotension, will continue.  Will monitor blood pressure while taking midodrine  and metoprolol .  Chronic kidney disease stage IV. Acute kidney injury ruled out. Reviewed labs from outside source, patient creatinine level has been around 2.5 since late November 2025.  Renal function still appear to be stable now.  Essential hypertension Blood pressure borderline, discontinued all blood pressure medicines.  Type 2 diabetes mellitus with peripheral neuropathy (HCC) - The patient be placed on supplemental coverage with NovoLog . Glucose not significant elevated.  Chronic diastolic congestive heart failure. No recent echocardiogram, does not seem to have any exacerbation congestive heart failure. proBNP elevated at 5000 range, this is secondary to stage IV chronic disease. Hold off any diuretics.  Generalized weakness. Patient normally use a walker to walk, only walk a few steps due to weakness.  Recently getting worse.  Will obtain PT/OT, anticipating nursing home placement.      Subjective:  Patient still has significant weakness, does not feel short of breath today.  Physical Exam: Vitals:   02/02/25 0234 02/02/25 0235 02/02/25 0940  BP:  113/61 99/61  Pulse:  (!) 104 (!) 111  Resp:   18  Temp:  (!) 97.5 F (36.4 C) 97.7 F (36.5 C)  TempSrc:  Oral Oral  SpO2:  94% 100%  Weight: 77.6 kg    Height: 5' 5 (1.651 m)     General exam: Ill-appearing and frail. Respiratory system: Decreased breath sounds without crackles or wheezes.SABRA  Respiratory effort normal. Cardiovascular system: S1 & S2 heard, RRR. No JVD, murmurs, rubs, gallops or clicks. No pedal edema. Gastrointestinal system: Abdomen is nondistended, soft and nontender. No organomegaly or masses  felt. Normal bowel sounds heard. Central nervous system: Alert and oriented x2. No focal neurological deficits. Extremities: Symmetric 5 x 5 power. Skin: No rashes, lesions or ulcers Psychiatry: Mood & affect appropriate.    Data Reviewed:  Reviewed chest x-ray, renal ultrasound and lab results.  Family Communication: Daughter updated at the bedside.  Disposition: Status is: Inpatient Remains inpatient appropriate because: Severity of disease, IV treatment.     Time spent: no charge minutes  Author: Murvin Mana, MD 02/02/2025 12:41 PM  For on call review [email or web address].    "

## 2025-02-02 NOTE — Assessment & Plan Note (Signed)
-   The patient be placed on supplemental coverage with NovoLog . - Will continue Neurontin . - Will hold off metformin .

## 2025-02-02 NOTE — Hospital Course (Signed)
 Megan Bolton is a 84 y.o. African-American female with medical history significant for diastolic CHF, COPD, on home O2 at 3 L p.m. type diabetes mellitus, hypertension and dyslipidemia, who presented to the emergency room with acute onset of excessive fatigue and generalized weakness.  She admitted to dyspnea with associated cough occasionally productive of clear sputum as well as occasional wheezing.  Upon arrival to emergency room, patient had tachycardia 105, no leukocytosis.  Chest x-ray showed right upper lobe airspace disease.  Patient was started on Rocephin  and doxycycline  for pneumonia.
